# Patient Record
Sex: Female | Born: 1945
Health system: Southern US, Community
[De-identification: ages and names within clinical notes are randomized; demographics above are authoritative.]

## PROBLEM LIST (undated history)

## (undated) DIAGNOSIS — M329 Systemic lupus erythematosus, unspecified: Secondary | ICD-10-CM

## (undated) DIAGNOSIS — M1990 Unspecified osteoarthritis, unspecified site: Secondary | ICD-10-CM

## (undated) DIAGNOSIS — T07XXXA Unspecified multiple injuries, initial encounter: Secondary | ICD-10-CM

## (undated) DIAGNOSIS — J449 Chronic obstructive pulmonary disease, unspecified: Secondary | ICD-10-CM

## (undated) DIAGNOSIS — Z992 Dependence on renal dialysis: Secondary | ICD-10-CM

## (undated) DIAGNOSIS — I10 Essential (primary) hypertension: Secondary | ICD-10-CM

## (undated) DIAGNOSIS — N390 Urinary tract infection, site not specified: Secondary | ICD-10-CM

## (undated) DIAGNOSIS — I4892 Unspecified atrial flutter: Secondary | ICD-10-CM

## (undated) DIAGNOSIS — E119 Type 2 diabetes mellitus without complications: Secondary | ICD-10-CM

## (undated) DIAGNOSIS — I82629 Acute embolism and thrombosis of deep veins of unspecified upper extremity: Secondary | ICD-10-CM

## (undated) DIAGNOSIS — IMO0002 Reserved for concepts with insufficient information to code with codable children: Secondary | ICD-10-CM

## (undated) DIAGNOSIS — K635 Polyp of colon: Secondary | ICD-10-CM

## (undated) DIAGNOSIS — D649 Anemia, unspecified: Secondary | ICD-10-CM

## (undated) DIAGNOSIS — M7750 Other enthesopathy of unspecified foot: Secondary | ICD-10-CM

## (undated) DIAGNOSIS — I5032 Chronic diastolic (congestive) heart failure: Secondary | ICD-10-CM

## (undated) DIAGNOSIS — S81801A Unspecified open wound, right lower leg, initial encounter: Secondary | ICD-10-CM

## (undated) DIAGNOSIS — I4891 Unspecified atrial fibrillation: Secondary | ICD-10-CM

## (undated) DIAGNOSIS — H409 Unspecified glaucoma: Secondary | ICD-10-CM

## (undated) DIAGNOSIS — J45909 Unspecified asthma, uncomplicated: Secondary | ICD-10-CM

## (undated) DIAGNOSIS — I319 Disease of pericardium, unspecified: Secondary | ICD-10-CM

## (undated) DIAGNOSIS — N186 End stage renal disease: Secondary | ICD-10-CM

## (undated) HISTORY — DX: Acute embolism and thrombosis of deep veins of unspecified upper extremity: I82.629

## (undated) HISTORY — DX: Dependence on renal dialysis: Z99.2

## (undated) HISTORY — DX: Unspecified atrial fibrillation: I48.91

## (undated) HISTORY — DX: Unspecified osteoarthritis, unspecified site: M19.90

## (undated) HISTORY — DX: Urinary tract infection, site not specified: N39.0

## (undated) HISTORY — PX: EYE SURGERY: SHX253

## (undated) HISTORY — DX: Other enthesopathy of unspecified foot and ankle: M77.50

## (undated) HISTORY — DX: Disease of pericardium, unspecified: I31.9

## (undated) HISTORY — DX: Chronic obstructive pulmonary disease, unspecified: J44.9

## (undated) HISTORY — DX: End stage renal disease: N18.6

## (undated) HISTORY — DX: Unspecified atrial flutter: I48.92

## (undated) HISTORY — DX: Systemic lupus erythematosus, unspecified: M32.9

## (undated) HISTORY — DX: Reserved for concepts with insufficient information to code with codable children: IMO0002

## (undated) HISTORY — PX: CATARACT EXTRACTION: SUR2

## (undated) HISTORY — DX: Unspecified multiple injuries, initial encounter: T07.XXXA

## (undated) HISTORY — DX: Essential (primary) hypertension: I10

## (undated) HISTORY — DX: Chronic diastolic (congestive) heart failure: I50.32

## (undated) HISTORY — DX: Type 2 diabetes mellitus without complications: E11.9

---

## 1978-09-29 HISTORY — PX: BACK SURGERY: SHX140

## 1981-09-29 HISTORY — PX: ABDOMINAL HYSTERECTOMY: SHX81

## 1991-09-30 HISTORY — PX: ANKLE SURGERY: SHX546

## 2001-08-02 ENCOUNTER — Ambulatory Visit (HOSPITAL_COMMUNITY): Admission: RE | Admit: 2001-08-02 | Discharge: 2001-08-02 | Payer: Self-pay | Admitting: Internal Medicine

## 2001-08-02 ENCOUNTER — Encounter: Payer: Self-pay | Admitting: Internal Medicine

## 2001-08-31 ENCOUNTER — Ambulatory Visit (HOSPITAL_COMMUNITY): Admission: RE | Admit: 2001-08-31 | Discharge: 2001-08-31 | Payer: Self-pay | Admitting: Internal Medicine

## 2001-08-31 ENCOUNTER — Encounter: Payer: Self-pay | Admitting: Internal Medicine

## 2003-09-30 HISTORY — PX: COLONOSCOPY: SHX174

## 2003-11-03 ENCOUNTER — Ambulatory Visit (HOSPITAL_COMMUNITY): Admission: RE | Admit: 2003-11-03 | Discharge: 2003-11-03 | Payer: Self-pay | Admitting: Family Medicine

## 2003-11-09 ENCOUNTER — Ambulatory Visit (HOSPITAL_COMMUNITY): Admission: RE | Admit: 2003-11-09 | Discharge: 2003-11-09 | Payer: Self-pay | Admitting: Family Medicine

## 2004-01-11 ENCOUNTER — Ambulatory Visit (HOSPITAL_COMMUNITY): Admission: RE | Admit: 2004-01-11 | Discharge: 2004-01-11 | Payer: Self-pay | Admitting: *Deleted

## 2004-01-11 ENCOUNTER — Ambulatory Visit (HOSPITAL_COMMUNITY): Admission: RE | Admit: 2004-01-11 | Discharge: 2004-01-11 | Payer: Self-pay | Admitting: Family Medicine

## 2004-08-30 ENCOUNTER — Ambulatory Visit: Payer: Self-pay | Admitting: Internal Medicine

## 2004-09-05 ENCOUNTER — Ambulatory Visit: Payer: Self-pay | Admitting: Internal Medicine

## 2004-09-20 ENCOUNTER — Ambulatory Visit: Payer: Self-pay | Admitting: Internal Medicine

## 2004-09-20 ENCOUNTER — Ambulatory Visit (HOSPITAL_COMMUNITY): Admission: RE | Admit: 2004-09-20 | Discharge: 2004-09-20 | Payer: Self-pay | Admitting: Internal Medicine

## 2006-02-16 ENCOUNTER — Ambulatory Visit (HOSPITAL_COMMUNITY): Admission: RE | Admit: 2006-02-16 | Discharge: 2006-02-16 | Payer: Self-pay | Admitting: Family Medicine

## 2007-06-02 ENCOUNTER — Ambulatory Visit (HOSPITAL_COMMUNITY): Admission: RE | Admit: 2007-06-02 | Discharge: 2007-06-02 | Payer: Self-pay | Admitting: Family Medicine

## 2007-08-18 ENCOUNTER — Ambulatory Visit (HOSPITAL_COMMUNITY): Admission: RE | Admit: 2007-08-18 | Discharge: 2007-08-18 | Payer: Self-pay | Admitting: Family Medicine

## 2007-09-27 ENCOUNTER — Emergency Department (HOSPITAL_COMMUNITY): Admission: EM | Admit: 2007-09-27 | Discharge: 2007-09-27 | Payer: Self-pay | Admitting: Emergency Medicine

## 2009-04-08 ENCOUNTER — Emergency Department (HOSPITAL_COMMUNITY): Admission: EM | Admit: 2009-04-08 | Discharge: 2009-04-08 | Payer: Self-pay | Admitting: Emergency Medicine

## 2009-12-19 ENCOUNTER — Encounter (HOSPITAL_COMMUNITY): Admission: RE | Admit: 2009-12-19 | Discharge: 2010-01-18 | Payer: Self-pay | Admitting: Cardiology

## 2009-12-19 ENCOUNTER — Encounter (INDEPENDENT_AMBULATORY_CARE_PROVIDER_SITE_OTHER): Payer: Self-pay | Admitting: Cardiology

## 2009-12-19 HISTORY — PX: CARDIOVASCULAR STRESS TEST: SHX262

## 2010-04-23 ENCOUNTER — Ambulatory Visit (HOSPITAL_COMMUNITY): Admission: RE | Admit: 2010-04-23 | Discharge: 2010-04-23 | Payer: Self-pay | Admitting: Family Medicine

## 2010-10-19 ENCOUNTER — Encounter: Payer: Self-pay | Admitting: Family Medicine

## 2011-02-14 NOTE — Consult Note (Signed)
NAMEABBIE, DYKEMAN           ACCOUNT NO.:  [id number]   MEDICAL RECORD NO.:  [id number]            PATIENT TYPE:   LOCATION:                                 FACILITY:   PHYSICIAN:  R. Garfield Cornea, M.D. DATE OF BIRTH:  28-Oct-1945   DATE OF CONSULTATION:  08/30/2004  DATE OF DISCHARGE:                                   CONSULTATION   PHYSICIAN REQUESTING CONSULTATION:  Dr. Bonne Dolores.   REASON FOR CONSULTATION:  Anemia.   HISTORY OF PRESENT ILLNESS:  The patient is a 65 year old lady who presents  today for further evaluation of anemia.  She was found to be anemic in April  of 2005.  Hemoglobin 8.9, hematocrit 27.7, MCV 83.4.  She had blood work  last week that we do not have that of yet.  She was diagnosed with lupus at  that time.  She has not had any hemoccults done.  Her bowels move regularly.  She denies any melena or rectal bleeding, abdominal pain, nausea, vomiting  or heartburn.  She has never had a colonoscopy.   CURRENT MEDICATIONS:  1.  Novolin 70/30, 50 units in the morning.  2.  Cardizem CD 120 mg daily.  3.  Plaquenil 200 mg b.i.d.  4.  Ayoyeast 400 mg p.r.n.  5.  Vaseretic 10/25 mg daily.  6.  Prednisone 5 mg daily.   ALLERGIES:  No known drug allergies.   PAST MEDICAL HISTORY:  Insulin-dependent diabetes mellitus for 25 years  duration.  Diagnosis of lupus in April of 2005, hypertension.   PAST SURGICAL HISTORY:  1.  Hysterectomy.  2.  Lumbar disk surgery.  3.  Right ankle surgery due to fracture.   FAMILY HISTORY:  Mother had stroke.  Father died of unknown type of cancer.  No family history of colorectal cancer to her knowledge.   SOCIAL HISTORY:  She is married and has no children.  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.  She smokes 5-6 cigarettes daily, and  smoked for most of my life.  She denies any alcohol use.   REVIEW OF SYSTEMS:  See HPI for GI.  CARDIOPULMONARY:  Denies any chest pain  or shortness of breath.  GU:   Denies any hematuria or dysuria.   PHYSICAL EXAMINATION:  VITAL SIGNS:  Weight 255, height 5 feet 5 inches.  Temperature 98.  Blood pressure 176/86.  Pulse 92.  GENERAL:  A pleasant, morbidly obese black female, in no acute distress.  SKIN:  Warm and dry, no jaundice.  HEENT:  Conjunctivae are pink.  Sclerae are nonicteric.  Oropharyngeal  mucosa is moist and pink.  No lesions, erythema or exudate.  No  lymphadenopathy or thyromegaly.  CHEST:  Lungs are clear to auscultation.  CARDIAC:  Exam reveals regular rate and rhythm, normal S1 and S2.  No  murmurs, rubs or gallops.  ABDOMEN:  Positive bowel sounds, obese but symmetrical.  Soft, nontender, no  organomegaly or masses are appreciated but limited due to body habitus.  EXTREMITIES:  No edema.   IMPRESSION:  Shalette is a 65 year old  lady who presents today with a  normocytic anemia diagnosed around April of 2005.  Recent blood work is  unavailable to me at this time.  At any rate, she has never had a  colonoscopy and needs to have one at this time at least for screening  purposes.  If we document that she has blood in her stool or any evidence of  iron deficiency, this would be more of a diagnostic maneuver.  If  colonoscopy is unremarkable and she does have heme positive stools, then she  will need to have further evaluation of her upper GI tract.  I discussed the  risks, alternatives and benefits of colonoscopy with the patient today, and  she is agreeable to proceed.   PLAN:  1.  Colonoscopy in the near future.  2.  Hemoccult stool x3.  3.  We will request a copy of recent blood work done through Dr. Elta Guadeloupe      Cresenzo's office as well as for any anemia profile studies.     Lesl   LL/MEDQ  D:  09-14-2004  T:  14-Sep-2004  Job:  [id number]   cc:   Bonne Dolores, M.D.  [address], [place] [address]  Fax: [phone]

## 2011-02-14 NOTE — Op Note (Signed)
[name], [name]           ACCOUNT NO.:  [id number]   MEDICAL RECORD NO.:  [id number]          PATIENT TYPE:  AMB   LOCATION:  DAY                           FACILITY:  [facility]   PHYSICIAN:  R. Garfield Cornea, M.D. DATE OF BIRTH:  1946/01/28   DATE OF PROCEDURE:  09/20/2004  DATE OF DISCHARGE:                                 OPERATIVE REPORT   PROCEDURE:  Colonoscopy with snare polypectomy.   INDICATIONS FOR PROCEDURE:  The patient is a 64 year old lady, devoid of any  lower GI tract symptoms, here for screening colonoscopy. She is anemic.  Hemoccult status is unknown to me. No prior colon imaging. No family history  of colorectal neoplasia. This approach has been discussed with the patient  previously. Please see my November 01, 2003 consultation note and my  handwritten update.   PROCEDURE NOTE:  O2 saturation, blood pressure, pulse, and respirations  monitored throughout the entirety of the procedure. Conscious sedation with  Versed 5 mg IV and Demerol 100 mg IV in divided doses.   INSTRUMENT:  Olympus video chip system.   FINDINGS:  Digital rectal examination revealed no abnormalities.   ENDOSCOPIC FINDINGS:  Prep was good.   Rectum:  Examination of the rectal mucosa including retroflexed view of anal  verge revealed two fleshy pedunculated polyps in at 2 cm from the anal verge  seen, only retroflexed. Otherwise rectal mucosa appeared normal. These  polyps were approximately, one was 3 mm and one was 5 mm in dimension.   Colon:  Colonic mucosa was surveyed from the rectosigmoid junction through  the left, transverse, and right colon to area of the appendiceal orifice,  ileocecal valve, and cecum. These structures were well seen and photographed  for the record. Olympus video scope was slowly withdrawn, and all previously  mentioned mucosal surfaces were again seen. There was a 5-mm pedunculated  polyp in the mid transverse colon which was cold snared and recovered. At  the splenic flexure, there was a 1.5-cm angry pedunculated polyp on a stalk  which was hot snared and recovered with a Roth net. Remainder of colonic  mucosa appeared normal. The patient tolerated the procedure well and was  reactive to endoscopy.   IMPRESSION:  1.  Rectal polyps removed with hot snare cautery and recovered. Otherwise      normal rectum.  2.  Pedunculated polyp at the splenic flexure removed with hot snare.      Transverse colon polyp removed with cold snare. Otherwise normal colon.   RECOMMENDATIONS:  1.  No aspirin or arthritis medications for 10 days.  2.  Follow up on pathology.  3.  Further recommendations to follow.     [name]   RMR/MEDQ  D:  09/20/2004  T:  09/21/2004  Job:  [id number]   cc:   Bonne Dolores, M.D.  [address], [place] [address]  Fax: [phone]

## 2011-07-04 LAB — BASIC METABOLIC PANEL
CO2: 25
Calcium: 8.5
GFR calc non Af Amer: >60
Sodium: 133 — ABNORMAL LOW

## 2011-07-04 LAB — DIFFERENTIAL
Basophils Absolute: 0
Basophils Relative: 0
Monocytes Absolute: 0.4
Neutro Abs: 6.9
Neutrophils Relative %: 85 — ABNORMAL HIGH

## 2011-07-04 LAB — CBC
HCT: 28.2 — ABNORMAL LOW
Hemoglobin: 9.7 — ABNORMAL LOW
MCHC: 34.4
RDW: 14.9

## 2011-07-04 LAB — POCT CARDIAC MARKERS
Operator id: 270651
Troponin i, poc: <0.05
Troponin i, poc: <0.05

## 2011-08-15 ENCOUNTER — Other Ambulatory Visit (HOSPITAL_COMMUNITY): Payer: Self-pay | Admitting: Physician Assistant

## 2011-08-15 ENCOUNTER — Ambulatory Visit (HOSPITAL_COMMUNITY)
Admission: RE | Admit: 2011-08-15 | Discharge: 2011-08-15 | Disposition: A | Discharge status: Home / Self Care | Payer: Medicare Other | Source: Ambulatory Visit | Attending: Physician Assistant | Admitting: Physician Assistant

## 2011-08-15 DIAGNOSIS — J449 Chronic obstructive pulmonary disease, unspecified: Secondary | ICD-10-CM

## 2011-08-15 DIAGNOSIS — R05 Cough: Secondary | ICD-10-CM

## 2011-08-15 DIAGNOSIS — R079 Chest pain, unspecified: Secondary | ICD-10-CM | POA: Insufficient documentation

## 2011-08-15 DIAGNOSIS — R059 Cough, unspecified: Secondary | ICD-10-CM | POA: Insufficient documentation

## 2011-08-15 DIAGNOSIS — Z139 Encounter for screening, unspecified: Secondary | ICD-10-CM

## 2011-09-08 ENCOUNTER — Ambulatory Visit (HOSPITAL_COMMUNITY): Payer: Medicare Other

## 2011-09-19 ENCOUNTER — Ambulatory Visit (HOSPITAL_COMMUNITY)
Admission: RE | Admit: 2011-09-19 | Discharge: 2011-09-19 | Disposition: A | Discharge status: Home / Self Care | Payer: Medicare Other | Source: Ambulatory Visit | Attending: Physician Assistant | Admitting: Physician Assistant

## 2011-09-19 DIAGNOSIS — Z139 Encounter for screening, unspecified: Secondary | ICD-10-CM

## 2011-09-19 DIAGNOSIS — Z1231 Encounter for screening mammogram for malignant neoplasm of breast: Secondary | ICD-10-CM | POA: Insufficient documentation

## 2012-09-04 ENCOUNTER — Encounter (HOSPITAL_COMMUNITY): Payer: Self-pay

## 2012-09-04 ENCOUNTER — Emergency Department (HOSPITAL_COMMUNITY): Payer: Medicare Other

## 2012-09-04 ENCOUNTER — Emergency Department (HOSPITAL_COMMUNITY)
Admission: EM | Admit: 2012-09-04 | Discharge: 2012-09-04 | Disposition: A | Discharge status: Home / Self Care | Payer: Medicare Other | Attending: Emergency Medicine | Admitting: Emergency Medicine

## 2012-09-04 DIAGNOSIS — E119 Type 2 diabetes mellitus without complications: Secondary | ICD-10-CM | POA: Insufficient documentation

## 2012-09-04 DIAGNOSIS — R0789 Other chest pain: Secondary | ICD-10-CM

## 2012-09-04 DIAGNOSIS — Z7901 Long term (current) use of anticoagulants: Secondary | ICD-10-CM | POA: Insufficient documentation

## 2012-09-04 DIAGNOSIS — Z87891 Personal history of nicotine dependence: Secondary | ICD-10-CM | POA: Insufficient documentation

## 2012-09-04 DIAGNOSIS — Z79899 Other long term (current) drug therapy: Secondary | ICD-10-CM | POA: Insufficient documentation

## 2012-09-04 DIAGNOSIS — R071 Chest pain on breathing: Secondary | ICD-10-CM | POA: Insufficient documentation

## 2012-09-04 DIAGNOSIS — I10 Essential (primary) hypertension: Secondary | ICD-10-CM | POA: Insufficient documentation

## 2012-09-04 DIAGNOSIS — J45909 Unspecified asthma, uncomplicated: Secondary | ICD-10-CM | POA: Insufficient documentation

## 2012-09-04 DIAGNOSIS — Z794 Long term (current) use of insulin: Secondary | ICD-10-CM | POA: Insufficient documentation

## 2012-09-04 HISTORY — DX: Unspecified asthma, uncomplicated: J45.909

## 2012-09-04 LAB — BASIC METABOLIC PANEL
Calcium: 9 mg/dL (ref 8.4–10.5)
Chloride: 100 mEq/L (ref 96–112)
Creatinine, Ser: 1.08 mg/dL (ref 0.50–1.10)
GFR calc Af Amer: 61 mL/min — ABNORMAL LOW (ref >90)
GFR calc non Af Amer: 52 mL/min — ABNORMAL LOW (ref >90)

## 2012-09-04 LAB — CBC WITH DIFFERENTIAL/PLATELET
Eosinophils Absolute: 0 10*3/uL (ref 0.0–0.7)
Eosinophils Relative: 0 % (ref 0–5)
HCT: 28.2 % — ABNORMAL LOW (ref 36.0–46.0)
Lymphs Abs: 0.9 10*3/uL (ref 0.7–4.0)
MCH: 28.2 pg (ref 26.0–34.0)
MCHC: 33 g/dL (ref 30.0–36.0)
MCV: 85.5 fL (ref 78.0–100.0)
Platelets: 218 10*3/uL (ref 150–400)

## 2012-09-04 MED ORDER — ASPIRIN 81 MG PO CHEW
324.0000 mg | CHEWABLE_TABLET | Freq: Once | ORAL | Status: AC
  Administered 2012-09-04: 324 mg via ORAL
  Filled 2012-09-04: qty 4

## 2012-09-04 NOTE — ED Notes (Signed)
Pt states her chest hurts when taking a deep breath

## 2012-09-04 NOTE — ED Provider Notes (Signed)
History     CSN: [id number]  Arrival date & time 09/04/12  [id number]   First MD Initiated Contact with Patient 09/04/12 [phone]      Chief Complaint  Patient presents with  . Pleurisy    (Consider location/radiation/quality/duration/timing/severity/associated sxs/prior treatment) HPI Comments: Pt states she has had "tightness" in her chest ~ 1 week.  "it is always there but sometimes it is worse than others"  Worse generally in past 2 days.  She went to see dr. Hilma Favors who sent her to the ED.  Pain is worse with movement, deep inspiration and palpation.  No fever or chills.  No cough or trauma.  Denies radiation, diaphoresis, n/v or pre-syncopal sxs.  The history is provided by the patient. No language interpreter was used.    Past Medical History  Diagnosis Date  . Asthma   . Hypertension   . Diabetes mellitus without complication     History reviewed. No pertinent past surgical history.  History reviewed. No pertinent family history.  History  Substance Use Topics  . Smoking status: Former Research scientist (life sciences)  . Smokeless tobacco: Not on file  . Alcohol Use: No    OB History    Grav Para Term Preterm Abortions TAB SAB Ect Mult Living                  Review of Systems  Constitutional: Negative for fever and chills.  Respiratory: Negative for cough, shortness of breath and wheezing.   Cardiovascular: Positive for chest pain.  All other systems reviewed and are negative.    Allergies  Review of patient's allergies indicates no known allergies.  Home Medications   Current Outpatient Rx  Name  Route  Sig  Dispense  Refill  . ACETAMINOPHEN 500 MG PO TABS   Oral   Take 1,000 mg by mouth every 6 (six) hours as needed. For pain         . ALBUTEROL SULFATE HFA 108 (90 BASE) MCG/ACT IN AERS   Inhalation   Inhale 2 puffs into the lungs every 6 (six) hours as needed. For shortness of breath         . ASPIRIN EC 81 MG PO TBEC   Oral   Take 81 mg by mouth daily.         Marland Kitchen  HYDROXYCHLOROQUINE SULFATE 200 MG PO TABS   Oral   Take 200 mg by mouth 2 (two) times daily.         . INSULIN GLARGINE 100 UNIT/ML [place] SOLN   Subcutaneous   Inject 70 Units into the skin at bedtime.         . CENTRUM SILVER PO   Oral   Take 1 tablet by mouth daily.         Marland Kitchen PREDNISONE 5 MG PO TABS   Oral   Take 5 mg by mouth daily.         Marland Kitchen PRESCRIPTION MEDICATION   Oral   Take 1 tablet by mouth daily. Blood pressure medication- unknown name/strength           BP 162/56  Pulse 103  Temp 98.7 F (37.1 C) (Oral)  Resp 24  Ht 5\' 5"  (1.651 m)  Wt 250 lb (113.399 kg)  BMI 41.60 kg/m2  SpO2 98%  Physical Exam  Nursing note and vitals reviewed. Constitutional: She is oriented to person, place, and time. She appears well-developed and well-nourished.  Non-toxic appearance. She does not have a sickly appearance. She  does not appear ill. No distress.  HENT:  Head: Normocephalic and atraumatic.  Right Ear: External ear normal.  Left Ear: External ear normal.  Eyes: EOM are normal.  Neck: Normal range of motion.  Cardiovascular: Normal rate, regular rhythm and normal heart sounds.   Pulmonary/Chest: Effort normal and breath sounds normal. No accessory muscle usage. Not tachypneic. No respiratory distress. She has no decreased breath sounds. She has no wheezes. She has no rhonchi. She has no rales. She exhibits tenderness. She exhibits no crepitus, no swelling and no retraction.    Abdominal: Soft. She exhibits no distension. There is no tenderness.  Musculoskeletal: Normal range of motion.  Neurological: She is alert and oriented to person, place, and time. Coordination normal.  Skin: Skin is warm and dry.  Psychiatric: She has a normal mood and affect. Judgment normal.    ED Course  Procedures (including critical care time)  Labs Reviewed  CBC WITH DIFFERENTIAL - Abnormal; Notable for the following:    RBC 3.30 (*)     Hemoglobin 9.3 (*)     HCT 28.2 (*)       Neutrophils Relative 83 (*)     Neutro Abs 8.3 (*)     Lymphocytes Relative 9 (*)     All other components within normal limits  BASIC METABOLIC PANEL - Abnormal; Notable for the following:    Sodium 134 (*)     Glucose, Bld 46 (*)     GFR calc non Af Amer 52 (*)     GFR calc Af Amer 61 (*)     All other components within normal limits  TROPONIN I   Dg Chest 2 View  09/04/2012  *RADIOLOGY REPORT*  Clinical Data: Pleurisy  CHEST - 2 VIEW  Comparison: 08/15/2011  Findings: Cardiomegaly again noted.  No acute infiltrate or pleural effusion.  No pulmonary edema.  Stable mild degenerative changes thoracic spine.  IMPRESSION: No acute infiltrate or pulmonary edema.  Stable mild degenerative changes thoracic spine.   Original Report Authenticated By: Lahoma Crocker, M.D.    Date: 09/04/2012  Rate: 100  Rhythm: normal sinus rhythm  QRS Axis: normal  Intervals: normal  ST/T Wave abnormalities: nonspecific ST/T changes  Conduction Disutrbances:none  Narrative Interpretation:   Old EKG Reviewed: unchanged from 09-27-2007    1. Chest wall pain       MDM  Ibuprofen up to 800 mg every 8 hrs with food.  F/u with dr. Hilma Favors.        Jennye Boroughs, [place] 09/04/12 1156

## 2012-09-04 NOTE — ED Provider Notes (Signed)
Medical screening examination/treatment/procedure(s) were performed by non-physician practitioner and as supervising physician I was immediately available for consultation/collaboration.   Charles B. Karle Starch, MD 09/04/12 1201

## 2012-09-14 ENCOUNTER — Encounter (HOSPITAL_COMMUNITY): Payer: Self-pay

## 2012-09-14 ENCOUNTER — Inpatient Hospital Stay: Admission: AD | Admit: 2012-09-14 | Payer: Self-pay | Source: Other Acute Inpatient Hospital | Admitting: Cardiology

## 2012-09-14 ENCOUNTER — Inpatient Hospital Stay (HOSPITAL_COMMUNITY)
Admission: EM | Admit: 2012-09-14 | Discharge: 2012-09-20 | DRG: 308 | Disposition: A | Discharge status: Home / Self Care | Payer: Medicare Other | Attending: Cardiovascular Disease | Admitting: Cardiovascular Disease

## 2012-09-14 ENCOUNTER — Emergency Department (HOSPITAL_COMMUNITY): Payer: Medicare Other

## 2012-09-14 DIAGNOSIS — J449 Chronic obstructive pulmonary disease, unspecified: Secondary | ICD-10-CM | POA: Diagnosis present

## 2012-09-14 DIAGNOSIS — E1140 Type 2 diabetes mellitus with diabetic neuropathy, unspecified: Secondary | ICD-10-CM | POA: Diagnosis present

## 2012-09-14 DIAGNOSIS — Z7982 Long term (current) use of aspirin: Secondary | ICD-10-CM

## 2012-09-14 DIAGNOSIS — I129 Hypertensive chronic kidney disease with stage 1 through stage 4 chronic kidney disease, or unspecified chronic kidney disease: Secondary | ICD-10-CM | POA: Diagnosis present

## 2012-09-14 DIAGNOSIS — D126 Benign neoplasm of colon, unspecified: Secondary | ICD-10-CM | POA: Diagnosis present

## 2012-09-14 DIAGNOSIS — E669 Obesity, unspecified: Secondary | ICD-10-CM

## 2012-09-14 DIAGNOSIS — Z79899 Other long term (current) drug therapy: Secondary | ICD-10-CM

## 2012-09-14 DIAGNOSIS — E875 Hyperkalemia: Secondary | ICD-10-CM | POA: Diagnosis present

## 2012-09-14 DIAGNOSIS — D649 Anemia, unspecified: Secondary | ICD-10-CM | POA: Diagnosis present

## 2012-09-14 DIAGNOSIS — I2789 Other specified pulmonary heart diseases: Secondary | ICD-10-CM | POA: Diagnosis present

## 2012-09-14 DIAGNOSIS — I4891 Unspecified atrial fibrillation: Secondary | ICD-10-CM | POA: Diagnosis present

## 2012-09-14 DIAGNOSIS — E871 Hypo-osmolality and hyponatremia: Secondary | ICD-10-CM | POA: Diagnosis present

## 2012-09-14 DIAGNOSIS — N183 Chronic kidney disease, stage 3 unspecified: Secondary | ICD-10-CM | POA: Diagnosis present

## 2012-09-14 DIAGNOSIS — I359 Nonrheumatic aortic valve disorder, unspecified: Secondary | ICD-10-CM | POA: Diagnosis present

## 2012-09-14 DIAGNOSIS — R079 Chest pain, unspecified: Secondary | ICD-10-CM

## 2012-09-14 DIAGNOSIS — I5031 Acute diastolic (congestive) heart failure: Secondary | ICD-10-CM | POA: Diagnosis present

## 2012-09-14 DIAGNOSIS — N289 Disorder of kidney and ureter, unspecified: Secondary | ICD-10-CM | POA: Diagnosis present

## 2012-09-14 DIAGNOSIS — R7989 Other specified abnormal findings of blood chemistry: Secondary | ICD-10-CM | POA: Diagnosis present

## 2012-09-14 DIAGNOSIS — I10 Essential (primary) hypertension: Secondary | ICD-10-CM | POA: Diagnosis present

## 2012-09-14 DIAGNOSIS — I4892 Unspecified atrial flutter: Principal | ICD-10-CM | POA: Diagnosis present

## 2012-09-14 DIAGNOSIS — R590 Localized enlarged lymph nodes: Secondary | ICD-10-CM | POA: Diagnosis present

## 2012-09-14 DIAGNOSIS — E11649 Type 2 diabetes mellitus with hypoglycemia without coma: Secondary | ICD-10-CM | POA: Insufficient documentation

## 2012-09-14 DIAGNOSIS — I351 Nonrheumatic aortic (valve) insufficiency: Secondary | ICD-10-CM | POA: Diagnosis present

## 2012-09-14 DIAGNOSIS — M329 Systemic lupus erythematosus, unspecified: Secondary | ICD-10-CM | POA: Diagnosis present

## 2012-09-14 DIAGNOSIS — I5033 Acute on chronic diastolic (congestive) heart failure: Secondary | ICD-10-CM | POA: Diagnosis present

## 2012-09-14 DIAGNOSIS — D509 Iron deficiency anemia, unspecified: Secondary | ICD-10-CM | POA: Diagnosis present

## 2012-09-14 DIAGNOSIS — E119 Type 2 diabetes mellitus without complications: Secondary | ICD-10-CM

## 2012-09-14 DIAGNOSIS — F17200 Nicotine dependence, unspecified, uncomplicated: Secondary | ICD-10-CM | POA: Diagnosis present

## 2012-09-14 DIAGNOSIS — Z7901 Long term (current) use of anticoagulants: Secondary | ICD-10-CM

## 2012-09-14 DIAGNOSIS — I509 Heart failure, unspecified: Secondary | ICD-10-CM | POA: Diagnosis present

## 2012-09-14 DIAGNOSIS — E66812 Obesity, class 2: Secondary | ICD-10-CM | POA: Diagnosis present

## 2012-09-14 DIAGNOSIS — Z6841 Body Mass Index (BMI) 40.0 and over, adult: Secondary | ICD-10-CM

## 2012-09-14 DIAGNOSIS — I272 Pulmonary hypertension, unspecified: Secondary | ICD-10-CM | POA: Diagnosis present

## 2012-09-14 DIAGNOSIS — J4489 Other specified chronic obstructive pulmonary disease: Secondary | ICD-10-CM | POA: Diagnosis present

## 2012-09-14 DIAGNOSIS — E1169 Type 2 diabetes mellitus with other specified complication: Secondary | ICD-10-CM | POA: Diagnosis present

## 2012-09-14 HISTORY — DX: Anemia, unspecified: D64.9

## 2012-09-14 HISTORY — DX: Systemic lupus erythematosus, unspecified: M32.9

## 2012-09-14 LAB — BASIC METABOLIC PANEL
BUN: 23 mg/dL (ref 6–23)
CO2: 27 mEq/L (ref 19–32)
Chloride: 93 mEq/L — ABNORMAL LOW (ref 96–112)
Creatinine, Ser: 1.26 mg/dL — ABNORMAL HIGH (ref 0.50–1.10)
GFR calc Af Amer: 50 mL/min — ABNORMAL LOW (ref >90)
Potassium: 4.7 mEq/L (ref 3.5–5.1)

## 2012-09-14 LAB — HEPATIC FUNCTION PANEL
ALT: 18 U/L (ref 0–35)
Alkaline Phosphatase: 79 U/L (ref 39–117)
Bilirubin, Direct: 0.1 mg/dL (ref 0.0–0.3)
Indirect Bilirubin: 0.1 mg/dL — ABNORMAL LOW (ref 0.3–0.9)
Total Bilirubin: 0.2 mg/dL — ABNORMAL LOW (ref 0.3–1.2)

## 2012-09-14 LAB — CBC
HCT: 24.2 % — ABNORMAL LOW (ref 36.0–46.0)
Hemoglobin: 8 g/dL — ABNORMAL LOW (ref 12.0–15.0)
MCV: 84.9 fL (ref 78.0–100.0)
RBC: 2.85 MIL/uL — ABNORMAL LOW (ref 3.87–5.11)
RDW: 14.9 % (ref 11.5–15.5)
WBC: 7.1 10*3/uL (ref 4.0–10.5)

## 2012-09-14 LAB — BLOOD GAS, ARTERIAL
Drawn by: 21694
pCO2 arterial: 36.2 mmHg (ref 35.0–45.0)
pH, Arterial: 7.431 (ref 7.350–7.450)

## 2012-09-14 LAB — GLUCOSE, CAPILLARY
Glucose-Capillary: 70 mg/dL (ref 70–99)
Glucose-Capillary: <10 mg/dL — CL (ref 70–99)
Glucose-Capillary: 124 mg/dL — ABNORMAL HIGH (ref 70–99)

## 2012-09-14 LAB — D-DIMER, QUANTITATIVE: D-Dimer, Quant: 10.72 ug/mL-FEU — ABNORMAL HIGH (ref 0.00–0.48)

## 2012-09-14 MED ORDER — IPRATROPIUM BROMIDE 0.02 % IN SOLN: 0.5000 mg | Freq: Four times a day (QID) | RESPIRATORY_TRACT | Status: DC | PRN

## 2012-09-14 MED ORDER — FLEET ENEMA 7-19 GM/118ML RE ENEM: 1.0000 | ENEMA | Freq: Once | RECTAL | Status: AC | PRN

## 2012-09-14 MED ORDER — DILTIAZEM HCL 100 MG IV SOLR
5.0000 mg/h | INTRAVENOUS | Status: DC
  Administered 2012-09-15: 5 mg/h via INTRAVENOUS
  Administered 2012-09-16: 15 mg/h via INTRAVENOUS
  Filled 2012-09-14: qty 100

## 2012-09-14 MED ORDER — LEVALBUTEROL HCL 0.63 MG/3ML IN NEBU: 0.6300 mg | INHALATION_SOLUTION | Freq: Four times a day (QID) | RESPIRATORY_TRACT | Status: DC | PRN

## 2012-09-14 MED ORDER — POTASSIUM CHLORIDE IN NACL 20-0.9 MEQ/L-% IV SOLN
INTRAVENOUS | Status: DC
  Administered 2012-09-15: 1000 mL via INTRAVENOUS
  Administered 2012-09-15: 20 mL/h via INTRAVENOUS
  Filled 2012-09-14 (×2): qty 1000

## 2012-09-14 MED ORDER — SORBITOL 70 % SOLN
30.0000 mL | Freq: Every day | Status: DC | PRN
  Administered 2012-09-15 – 2012-09-19 (×2): 30 mL via ORAL
  Filled 2012-09-14 (×2): qty 30

## 2012-09-14 MED ORDER — DILTIAZEM HCL 50 MG/10ML IV SOLN
10.0000 mg | Freq: Once | INTRAVENOUS | Status: AC
  Administered 2012-09-14: 10 mg via INTRAVENOUS
  Filled 2012-09-14: qty 2

## 2012-09-14 MED ORDER — HEPARIN (PORCINE) IN NACL 100-0.45 UNIT/ML-% IJ SOLN
14.0000 [IU]/kg/h | INTRAMUSCULAR | Status: AC
  Administered 2012-09-14: 14 [IU]/kg/h via INTRAVENOUS
  Filled 2012-09-14 (×4): qty 250

## 2012-09-14 MED ORDER — METOPROLOL TARTRATE 1 MG/ML IV SOLN
5.0000 mg | Freq: Once | INTRAVENOUS | Status: AC
  Administered 2012-09-14: 5 mg via INTRAVENOUS
  Filled 2012-09-14: qty 5

## 2012-09-14 MED ORDER — METOPROLOL TARTRATE 25 MG/10 ML ORAL SUSPENSION: 25.0000 mg | Freq: Two times a day (BID) | ORAL | Status: DC

## 2012-09-14 MED ORDER — PREDNISONE 10 MG PO TABS
10.0000 mg | ORAL_TABLET | Freq: Every day | ORAL | Status: DC
  Administered 2012-09-15: 10 mg via ORAL
  Filled 2012-09-14: qty 1

## 2012-09-14 MED ORDER — DILTIAZEM HCL 25 MG/5ML IV SOLN
10.0000 mg | Freq: Once | INTRAVENOUS | Status: AC
  Administered 2012-09-14: 10 mg via INTRAVENOUS

## 2012-09-14 MED ORDER — ACETAMINOPHEN 500 MG PO TABS: 1000.0000 mg | ORAL_TABLET | Freq: Four times a day (QID) | ORAL | Status: DC | PRN

## 2012-09-14 MED ORDER — HEPARIN BOLUS VIA INFUSION
4000.0000 [IU] | Freq: Once | INTRAVENOUS | Status: AC
  Administered 2012-09-14: 4000 [IU] via INTRAVENOUS

## 2012-09-14 MED ORDER — DILTIAZEM HCL 100 MG IV SOLR
5.0000 mg/h | Freq: Once | INTRAVENOUS | Status: AC
  Administered 2012-09-14: 5 mg/h via INTRAVENOUS
  Filled 2012-09-14: qty 100

## 2012-09-14 MED ORDER — INSULIN ASPART 100 UNIT/ML ~~LOC~~ SOLN
0.0000 [IU] | Freq: Three times a day (TID) | SUBCUTANEOUS | Status: DC
  Administered 2012-09-15: 5 [IU] via SUBCUTANEOUS
  Administered 2012-09-16 – 2012-09-17 (×2): 2 [IU] via SUBCUTANEOUS
  Administered 2012-09-17: 3 [IU] via SUBCUTANEOUS
  Administered 2012-09-17: 5 [IU] via SUBCUTANEOUS
  Administered 2012-09-18 (×2): 3 [IU] via SUBCUTANEOUS
  Administered 2012-09-18 – 2012-09-19 (×3): 5 [IU] via SUBCUTANEOUS
  Administered 2012-09-20: 2 [IU] via SUBCUTANEOUS
  Administered 2012-09-20: 5 [IU] via SUBCUTANEOUS

## 2012-09-14 MED ORDER — INSULIN ASPART 100 UNIT/ML ~~LOC~~ SOLN
0.0000 [IU] | Freq: Every day | SUBCUTANEOUS | Status: DC
Start: 2012-09-15 — End: 2012-09-20
  Administered 2012-09-15 – 2012-09-18 (×2): 2 [IU] via SUBCUTANEOUS

## 2012-09-14 MED ORDER — ONDANSETRON HCL 4 MG/2ML IJ SOLN: 4.0000 mg | INTRAMUSCULAR | Status: DC | PRN

## 2012-09-14 MED ORDER — DEXTROSE 5 % IV SOLN
5.0000 mg/h | Freq: Once | INTRAVENOUS | Status: AC
  Administered 2012-09-14: 10 mg/h via INTRAVENOUS

## 2012-09-14 MED ORDER — HYDROXYCHLOROQUINE SULFATE 200 MG PO TABS
200.0000 mg | ORAL_TABLET | Freq: Two times a day (BID) | ORAL | Status: DC
  Administered 2012-09-15 – 2012-09-20 (×12): 200 mg via ORAL
  Filled 2012-09-14 (×17): qty 1

## 2012-09-14 MED ORDER — DEXTROSE 50 % IV SOLN
INTRAVENOUS | Status: AC
  Filled 2012-09-14: qty 50

## 2012-09-14 MED ORDER — ASPIRIN EC 81 MG PO TBEC
81.0000 mg | DELAYED_RELEASE_TABLET | Freq: Every day | ORAL | Status: DC
  Administered 2012-09-15: 81 mg via ORAL
  Filled 2012-09-14 (×2): qty 1

## 2012-09-14 MED ORDER — DEXTROSE 50 % IV SOLN
1.0000 | Freq: Once | INTRAVENOUS | Status: AC
  Administered 2012-09-14: 50 mL via INTRAVENOUS

## 2012-09-14 MED ORDER — METOPROLOL TARTRATE 1 MG/ML IV SOLN
5.0000 mg | Freq: Once | INTRAVENOUS | Status: AC
Start: 2012-09-14 — End: 2012-09-14
  Administered 2012-09-14: 5 mg via INTRAVENOUS
  Filled 2012-09-14: qty 5

## 2012-09-14 NOTE — ED Notes (Signed)
Pt was here about a week ago with chest pain and had a complete work up. Was seen at her PCP this morning and sent back here for the same thing

## 2012-09-14 NOTE — ED Notes (Signed)
Pt awake, a and o x 4. Pt eating meal tray. cbg reads LO x 2 different machines. 1 amp D50 given IV. EDP notified. Vss. Pt asymptomatic.

## 2012-09-14 NOTE — ED Notes (Signed)
Assisted pt to and from bedside commode with minimal assistance.  Pt's HR prior to exertion 110bpm, after exertion, HR increased to 146 bpm and pt became more sob.  Pt back in bed at this time.  HR 103 bpm at rest and breathing easier at this time.  nad noted.

## 2012-09-14 NOTE — ED Notes (Signed)
edp notified of pt's HR.  Orders received.

## 2012-09-14 NOTE — ED Notes (Signed)
Gave patient pack of peanut butter nabs and orange juice x 2. Patient alert and oriented at this time.

## 2012-09-14 NOTE — ED Notes (Signed)
Still waiting on Stepdown bed at [facility]. Checked  with Bed Control earlier , still no bed.

## 2012-09-14 NOTE — ED Notes (Signed)
Attempted to call report. Was told the nurse would call me right back.

## 2012-09-14 NOTE — ED Provider Notes (Addendum)
History   This chart was scribed for Nat Christen, MD by Kathreen Cornfield, ED Scribe. The patient was seen in room [facility] and the patient's care was started at 10:55AM.     CSN: [id number]  Arrival date & time 09/14/12  1036   First MD Initiated Contact with Patient 09/14/12 1055      Chief Complaint  Patient presents with  . Chest Pain    (Consider location/radiation/quality/duration/timing/severity/associated sxs/prior treatment) The history is provided by the patient. No language interpreter was used.    [name] is a 66 y.o. female , with a hx of diabetes, hypertension, and chest pain, who presents to the Emergency Department complaining of sudden, progressively worsening, chest pain, located at the left side of the chest, onset three days ago (09/11/12) . Associated symptoms include shortness of breath and hypertension. The pt reports she was referred to [facility] this morning, by Dr. Delanna Ahmadi office, due to their concern regarding her evaluation which revealed chest pain and tachycardia.  The pt does not smoke or drink alcohol.   PCP is Dr. Hilma Favors.    Past Medical History  Diagnosis Date  . Asthma   . Hypertension   . Diabetes mellitus without complication     History reviewed. No pertinent past surgical history.  History reviewed. No pertinent family history.  History  Substance Use Topics  . Smoking status: Former Research scientist (life sciences)  . Smokeless tobacco: Not on file  . Alcohol Use: No    OB History    Grav Para Term Preterm Abortions TAB SAB Ect Mult Living                  Review of Systems  Cardiovascular: Positive for chest pain.  All other systems reviewed and are negative.    Allergies  Review of patient's allergies indicates no known allergies.  Home Medications   Current Outpatient Rx  Name  Route  Sig  Dispense  Refill  . ACETAMINOPHEN 500 MG PO TABS   Oral   Take 1,000 mg by mouth every 6 (six) hours as needed. For pain         .  ALBUTEROL SULFATE HFA 108 (90 BASE) MCG/ACT IN AERS   Inhalation   Inhale 2 puffs into the lungs every 6 (six) hours as needed. For shortness of breath         . ASPIRIN EC 81 MG PO TBEC   Oral   Take 81 mg by mouth daily.         Marland Kitchen HYDROXYCHLOROQUINE SULFATE 200 MG PO TABS   Oral   Take 200 mg by mouth 2 (two) times daily.         . INSULIN GLARGINE 100 UNIT/ML [place] SOLN   Subcutaneous   Inject 70 Units into the skin at bedtime.         . CENTRUM SILVER PO   Oral   Take 1 tablet by mouth daily.         Marland Kitchen PREDNISONE 5 MG PO TABS   Oral   Take 5 mg by mouth daily.         Marland Kitchen PRESCRIPTION MEDICATION   Oral   Take 1 tablet by mouth daily. Blood pressure medication- unknown name/strength           BP 126/81  Pulse 148  Temp 97.5 F (36.4 C) (Oral)  Resp 22  Ht 5\' 5"  (1.651 m)  Wt 250 lb (113.399 kg)  BMI 41.60 kg/m2  SpO2 92%  Physical Exam  Nursing note and vitals reviewed. Constitutional: She is oriented to person, place, and time. She appears well-developed and well-nourished.       Obese  HENT:  Head: Normocephalic and atraumatic.  Eyes: Conjunctivae normal and EOM are normal. Pupils are equal, round, and reactive to light.  Neck: Normal range of motion. Neck supple.  Cardiovascular: Regular rhythm and normal heart sounds.  Tachycardia present.   Pulmonary/Chest: Effort normal and breath sounds normal.  Abdominal: Soft. Bowel sounds are normal.  Musculoskeletal: Normal range of motion.  Neurological: She is alert and oriented to person, place, and time.  Skin: Skin is warm and dry.  Psychiatric: She has a normal mood and affect.    ED Course  Procedures (including critical care time)  DIAGNOSTIC STUDIES: Oxygen Saturation is 92% on [place], adequate by my interpretation.    COORDINATION OF CARE:   11:12 AM- Treatment plan concerning IV Cardizem and possible hospital admission discussed with patient. Pt agrees with treatment.    Results for  orders placed during the hospital encounter of 09/14/12  CBC      Component Value Range   WBC 7.1  4.0 - 10.5 K/uL   RBC 2.85 (*) 3.87 - 5.11 MIL/uL   Hemoglobin 8.0 (*) 12.0 - 15.0 g/dL   HCT 24.2 (*) 36.0 - 46.0 %   MCV 84.9  78.0 - 100.0 fL   MCH 28.1  26.0 - 34.0 pg   MCHC 33.1  30.0 - 36.0 g/dL   RDW 14.9  11.5 - 15.5 %   Platelets 395  150 - 400 K/uL  BASIC METABOLIC PANEL      Component Value Range   Sodium 129 (*) 135 - 145 mEq/L   Potassium 4.7  3.5 - 5.1 mEq/L   Chloride 93 (*) 96 - 112 mEq/L   CO2 27  19 - 32 mEq/L   Glucose, Bld 57 (*) 70 - 99 mg/dL   BUN 23  6 - 23 mg/dL   Creatinine, Ser 1.26 (*) 0.50 - 1.10 mg/dL   Calcium 8.6  8.4 - 10.5 mg/dL   GFR calc non Af Amer 43 (*) >90 mL/min   GFR calc Af Amer 50 (*) >90 mL/min  TROPONIN I      Component Value Range   Troponin I <0.30  <0.30 ng/mL    Dg Chest Port 1 View  09/14/2012  *RADIOLOGY REPORT*  Clinical Data: Chest pain and tachycardia.  PORTABLE CHEST - 1 VIEW  Comparison: PA and lateral chest 09/04/2012.  Findings: Marked enlargement cardiopericardial silhouette is again seen.  There is vascular congestion without frank edema.  No focal airspace disease, pleural effusion or pneumothorax.  IMPRESSION: No change in marked enlargement of the cardiopericardial silhouette and pulmonary vascular congestion.   Original Report Authenticated By: Orlean Patten, M.D.       No diagnosis found.   Date: 09/14/2012  11:14  Rate: 149  Rhythm: supraventricular tachycardia (SVT)  QRS Axis: normal  Intervals: normal  ST/T Wave abnormalities: normal  Conduction Disutrbances:none  Narrative Interpretation:   Old EKG Reviewed: none available    Date: 09/14/2012    12:15  Rate: 111  Rhythm: atrial flutter  QRS Axis: normal  Intervals: normal  ST/T Wave abnormalities: normal  Conduction Disutrbances:none  Narrative Interpretation:   Old EKG Reviewed: changes noted   CRITICAL CARE Performed by:  Nat Christen   Total critical care time: 30  Critical care time was exclusive of separately  billable procedures and treating other patients.  Critical care was necessary to treat or prevent imminent or life-threatening deterioration.  Critical care was time spent personally by me on the following activities: development of treatment plan with patient and/or surrogate as well as nursing, discussions with consultants, evaluation of patient's response to treatment, examination of patient, obtaining history from patient or surrogate, ordering and performing treatments and interventions, ordering and review of laboratory studies, ordering and review of radiographic studies, pulse oximetry and re-evaluation of patient's condition.  MDM  Patient presents with chest pain..  Initial EKG reveals a PSVT with a rate of 150.  Patient is also obese, anemic, hypertensive, diabetic.  IV Cardizem initiated along with IV Lopressor..  Rate has slowed to the low 100s.  Admit to [facility]     I personally performed the services described in this documentation, which was scribed in my presence. The recorded information has been reviewed and is accurate.   Nat Christen, MD 09/14/12 1354  I received a call from Dr. Verl Blalock at [facility] stating that there were no beds available and to see the patient to be admitted here. Heart rate had been controlled went back up to 140. Diltiazem drip was increased with further control of rate but her heart rate then went up again and she got an additional dose of metoprolol. Case was discussed with Dr. Megan Salon who is concerned that she looked uncomfortable and requested repeat cardiac enzymes be obtained. Repeat troponin is normal and heart rate has come down with this second dose of metoprolol. Also, Dr. Verl Blalock recommended that she be anticoagulated, so she was started on a heparin drip. Case was discussed once again with Dr. Megan Salon of [facility] who agrees  to admit her here with cardiology consultation to be obtained in the morning. Also, of note, when her heart rate has slowed down, her underlying rhythm seems to be atrial flutter rather than atrial fibrillation and conduction seems to vary between 2-1 and 3-1 induction.  Delora Fuel, MD [id number]   Date: 09/14/2012  2034  Rate: 123  Rhythm: Atrial Flutter  QRS Axis: normal  Intervals: normal  ST/T Wave abnormalities: T wave inversion in the lateral and anterolateral leads  Conduction Disutrbances:none  Narrative Interpretation: Atrial flutter, T wave inversion in anterolateral and lateral leads. No change from prior ECG.  Old EKG Reviewed: unchanged    Delora Fuel, MD [id number]

## 2012-09-14 NOTE — H&P (Signed)
[facility] History and Physical  [name]  [id number]  DOB: 11-Aug-1946   DOA: 09/14/2012   PCP:   [name], MD  Cardiology: Dr. Verl Blalock  Chief Complaint:  Chest pain and shortness of breath for 10 days  The history is somewhat compromised because patient falls asleep repeatedly during the interview, and gives information which is not corroborated by review of medical records.  HPI: [name] is an 66 y.o. female.   Morbidly obese African American lady, SLE, DM, HTN, tobacco abuse, and diastolic dysfunction on echo in 2011, reports chest pain and shortness of breath for the past week to 10 days. She reports she visited her primary care physician about 10 days ago and was treated symptomatically, had a repeat visit to him on Saturday, she was given Percocet for chest pain, and then sent to the [facility] emergency room, where she was treated and sent home. No record of this visit exits in EPIC.  Records show that she visited our emergency room 10 days ago for pleuritic chest pain; she had no EKG changes at that time, and she was sent home with ibuprofen.  Patient says she visits her for this the doctor again today and he again gave her Percocet for chest pain and again sent her to the emergency room. In the emergency room she was found to be in atrial flutter with rapid ventricular response and because of ongoing chest pain cardiologist were contacted and arrangements made to transfer her to [facility]. No bed has become available at [facility] and the hospitalist service has been called for assistance.  The patient has had several hypoglycemic episodes while in the emergency room, but blood sugar testing confirms she is no longer hypoglycemic. She attributes her drowsiness to the Percocet which she has recently taken. She has received no sedating medications in the emergency room.  She currently reports feeling very weak, very sick and still short  of breath; She denies daytime somnolence when she is at baseline.  Her heart rate which has been as high as the 160s, has shown improved control with Cardizem infusion, and episodic boluses of Lopressor.  She denies fever cough or cold. She denies peripheral circulatory problems, but reports she does suffer with numbness of her feet, and more recently they have been swelling.  Denies fever, cough or cold.  Her tobacco use is down to one or 2 cigarettes per day, and she sometimes uses an albuterol inhaler for wheezing  She is 5 foot 5 inches tall and estimates her weight at 250 pounds, but she has not yet been weighed.  Rewiew of Systems:   She denies any other symptom on a complete review; but as previously noted this is unreliable.  All systems negative except as marked bold or noted in the HPI;  Constitutional:  malaise, fever and chills. ;  Eyes: eye pain, redness and discharge. ;  ENMT: ear pain, hoarseness, nasal congestion, sinus pressure and sore throat. ;  Cardiovascular:  chest pain, palpitations, diaphoresis, dyspnea and peripheral edema. ;  Respiratory:  cough, hemoptysis, wheezing and stridor. ;  Gastrointestinal:  nausea, vomiting, diarrhea, constipation, abdominal pain, melena, blood in stool, hematemesis, jaundice and rectal bleeding. unusual weight loss..   Genitourinary: r frequency, dysuria, incontinence,flank pain and hematuria; Musculoskeletal:  back pain and neck pain.  swelling and trauma.;  Skin: .  pruritus, rash, abrasions, bruising and skin lesion.; ulcerations Neuro:  headache, lightheadedness and neck stiffness.  weakness,  altered level of consciousness , altered mental status, extremity weakness, burning feet, numbness of feet involuntary movement, seizure and syncope.  Psych:  anxiety, depression, insomnia, tearfulness, panic attacks, hallucinations, paranoia, suicidal or homicidal ideation    Past Medical History  Diagnosis Date  . Asthma   .  Hypertension   . Diabetes mellitus without complication   . SLE (systemic lupus erythematosus)     History reviewed. No pertinent past surgical history.  Medications:  HOME MEDS: Prior to Admission medications   Medication Sig Start Date End Date Taking? Authorizing Provider  acetaminophen (TYLENOL) 500 MG tablet Take 1,000 mg by mouth every 6 (six) hours as needed. For pain   Yes Historical Provider, MD  albuterol (PROVENTIL HFA;VENTOLIN HFA) 108 (90 BASE) MCG/ACT inhaler Inhale 2 puffs into the lungs every 6 (six) hours as needed. For shortness of breath   Yes Historical Provider, MD  aspirin EC 81 MG tablet Take 81 mg by mouth daily.   Yes Historical Provider, MD  hydroxychloroquine (PLAQUENIL) 200 MG tablet Take 200 mg by mouth 2 (two) times daily.   Yes Historical Provider, MD  insulin glargine (LANTUS) 100 UNIT/ML injection Inject 70 Units into the skin at bedtime.   Yes Historical Provider, MD  Multiple Vitamins-Minerals (CENTRUM SILVER PO) Take 1 tablet by mouth daily.   Yes Historical Provider, MD  predniSONE (DELTASONE) 5 MG tablet Take 5 mg by mouth daily.   Yes Historical Provider, MD  PRESCRIPTION MEDICATION Take 1 tablet by mouth daily. Blood pressure medication- unknown name/strength. Sample given from Dr. Delanna Ahmadi office.   Yes Historical Provider, MD     Allergies:  No Known Allergies  Social History:   reports that she has been smoking.  She does not have any smokeless tobacco history on file. She reports that she does not drink alcohol or use illicit drugs.  Family History: Family History  Problem Relation Age of Onset  . Diabetes Sister   . Diabetes Brother   . Diabetes Father   . Hypertension Sister   . Hypertension Brother   . Hypertension Father   . Stroke Mother      Physical Exam: Filed Vitals:   09/14/12 2056 09/14/12 2100 09/14/12 2115 09/14/12 2130  BP: 133/95 136/47  126/84  Pulse: 107  46   Temp:      TempSrc:      Resp: 22 24 20 20    Height:      Weight:      SpO2: 99%  97%    Blood pressure 126/84, pulse 46, temperature 98.3 F (36.8 C), temperature source Oral, resp. rate 20, height 5\' 5"  (1.651 m), weight 113.399 kg (250 lb), SpO2 97.00%.  GEN:  Pleasant but drowsy morbidly obese African American lady sitting up in the stretcher in no acute distress; tries to be cooperative with exam PSYCH:  alert and oriented x3; does not appear anxious or depressed; affect is flat HEENT: Mucous membranes pink, dry and anicteric; PERRLA; EOM intact; no cervical lymphadenopathy nor thyromegaly or carotid bruit; Breasts:: Not examined CHEST WALL: No tenderness CHEST: Normal respiration, clear to auscultation bilaterally HEART: Irregularly irregular rhythm; distant heart sounds no murmurs rubs or gallops BACK: No kyphosis or scoliosis; no CVA tenderness ABDOMEN: Obese, soft non-tender; no masses, no organomegaly, normal abdominal bowel sounds; large pannus; no intertriginous candida. Rectal Exam: Not done EXTREMITIES:  age-appropriate arthropathy of the hands and knees; duskiness coloration of the feet; cold feet but good pulses; trace edema bilaterally; healing ulceration  of the left shin, secondary to trauma. Genitalia: not examined PULSES: 2+ and symmetric SKIN: Normal hydration no rash or ulceration other than noted above CNS: Cranial nerves 2-12 grossly intact no focal lateralizing neurologic deficit   Labs on Admission:  Basic Metabolic Panel:  Lab [id number]  NA 129*  K 4.7  CL 93*  CO2 27  GLUCOSE 57*  BUN 23  CREATININE 1.26*  CALCIUM 8.6  MG --  PHOS --   Liver Function Tests: No results found for this basename: AST:5,ALT:5,ALKPHOS:5,BILITOT:5,PROT:5,ALBUMIN:5 in the last 168 hours No results found for this basename: LIPASE:5,AMYLASE:5 in the last 168 hours No results found for this basename: AMMONIA:5 in the last 168 hours CBC:  Lab 09/14/12 1114  WBC 7.1  NEUTROABS --  HGB 8.0*  HCT 24.2*  MCV  84.9  PLT 395   Cardiac Enzymes:  Lab 09/14/12 2025 09/14/12 1114  CKTOTAL -- --  CKMB -- --  CKMBINDEX -- --  TROPONINI <0.30 <0.30   BNP: No components found with this basename: POCBNP:5 D-dimer: No components found with this basename: D-DIMER:5 CBG:  Lab 09/14/12 2150 09/14/12 1921 09/14/12 1618 09/14/12 1404 09/14/12 1349  GLUCAP 124* 61* 107* 70 <10*    Radiological Exams on Admission: Dg Chest Port 1 View  09/14/2012  *RADIOLOGY REPORT*  Clinical Data: Chest pain and tachycardia.  PORTABLE CHEST - 1 VIEW  Comparison: PA and lateral chest 09/04/2012.  Findings: Marked enlargement cardiopericardial silhouette is again seen.  There is vascular congestion without frank edema.  No focal airspace disease, pleural effusion or pneumothorax.  IMPRESSION: No change in marked enlargement of the cardiopericardial silhouette and pulmonary vascular congestion.   Original Report Authenticated By: Orlean Patten, M.D.     EKG: Independently reviewed. Atrial flutter with rapid ventricular response   Assessment/Plan Present on Admission:  . Atrial flutter with rapid ventricular response . Morbid obesity . Diabetes mellitus type 2 in obese . Neuropathy in diabetes . SLE (systemic lupus erythematosus) . Hypoglycemia associated with diabetes . HTN (hypertension) . COPD (chronic obstructive pulmonary disease) Decompensated diastolic heart failure   PLAN: We'll admit this lady to the step down unit for rate control with diltiazem infusion; Continue heparinization as ordered by cardiology We'll cycle cardiac enzymes;  will get a ABGs to assess respiratory function in view of not completely explained drowsiness; Will check d-dimer;  Consider BiPAP CPAP or CT angiogram of the chest depending on results of investigation Increase the dose of her prednisone as of her acute decompensation; Hold her Lantus initially but give sliding scale insulin until her diuretic status is more  stable; When necessary nebulizations with Xopenex and ipratropium Her decompensated heart failure is likely related to her atrial flutter, but will hydrate her since she appears dehydrated and we plan possible CT angiogram of the chest.  Other plans as per orders.  Code Status: FULL CODE  Family Communication: Husband present earlier is now left, and plans were not discussed with him; he knows of the intention to admit to [facility] Disposition Plan: Depending on response to therapy  Critical care time: 60 minutes.   Cylas Falzone Nocturnist [facility] Pager [phone]   09/14/2012, 10:38 PM

## 2012-09-15 ENCOUNTER — Encounter (HOSPITAL_COMMUNITY): Payer: Self-pay | Admitting: Intensive Care

## 2012-09-15 ENCOUNTER — Inpatient Hospital Stay (HOSPITAL_COMMUNITY): Payer: Medicare Other

## 2012-09-15 DIAGNOSIS — R590 Localized enlarged lymph nodes: Secondary | ICD-10-CM | POA: Diagnosis present

## 2012-09-15 DIAGNOSIS — I509 Heart failure, unspecified: Secondary | ICD-10-CM

## 2012-09-15 DIAGNOSIS — R079 Chest pain, unspecified: Secondary | ICD-10-CM | POA: Diagnosis present

## 2012-09-15 DIAGNOSIS — D649 Anemia, unspecified: Secondary | ICD-10-CM

## 2012-09-15 DIAGNOSIS — E875 Hyperkalemia: Secondary | ICD-10-CM | POA: Insufficient documentation

## 2012-09-15 DIAGNOSIS — E871 Hypo-osmolality and hyponatremia: Secondary | ICD-10-CM | POA: Insufficient documentation

## 2012-09-15 DIAGNOSIS — N289 Disorder of kidney and ureter, unspecified: Secondary | ICD-10-CM | POA: Diagnosis present

## 2012-09-15 DIAGNOSIS — I5033 Acute on chronic diastolic (congestive) heart failure: Secondary | ICD-10-CM | POA: Diagnosis present

## 2012-09-15 LAB — GLUCOSE, CAPILLARY
Glucose-Capillary: 104 mg/dL — ABNORMAL HIGH (ref 70–99)
Glucose-Capillary: 92 mg/dL (ref 70–99)

## 2012-09-15 LAB — CBC
Hemoglobin: 7.5 g/dL — ABNORMAL LOW (ref 12.0–15.0)
MCHC: 32.3 g/dL (ref 30.0–36.0)
WBC: 9.3 10*3/uL (ref 4.0–10.5)

## 2012-09-15 LAB — URINALYSIS, ROUTINE W REFLEX MICROSCOPIC
Nitrite: NEGATIVE
Specific Gravity, Urine: >1.03 — ABNORMAL HIGH (ref 1.005–1.030)
Urobilinogen, UA: 0.2 mg/dL (ref 0.0–1.0)

## 2012-09-15 LAB — MRSA PCR SCREENING: MRSA by PCR: NEGATIVE

## 2012-09-15 LAB — BASIC METABOLIC PANEL
BUN: 24 mg/dL — ABNORMAL HIGH (ref 6–23)
CO2: 19 mEq/L (ref 19–32)
Calcium: 8.5 mg/dL (ref 8.4–10.5)
Chloride: 94 mEq/L — ABNORMAL LOW (ref 96–112)
Creatinine, Ser: 1.49 mg/dL — ABNORMAL HIGH (ref 0.50–1.10)
GFR calc Af Amer: 39 mL/min — ABNORMAL LOW (ref >90)
GFR calc Af Amer: 41 mL/min — ABNORMAL LOW (ref >90)
GFR calc non Af Amer: 34 mL/min — ABNORMAL LOW (ref >90)
Potassium: 5.5 mEq/L — ABNORMAL HIGH (ref 3.5–5.1)
Sodium: 128 mEq/L — ABNORMAL LOW (ref 135–145)
Sodium: 128 mEq/L — ABNORMAL LOW (ref 135–145)

## 2012-09-15 LAB — MAGNESIUM
Magnesium: 2.3 mg/dL (ref 1.5–2.5)
Magnesium: 2.4 mg/dL (ref 1.5–2.5)

## 2012-09-15 LAB — URINE MICROSCOPIC-ADD ON

## 2012-09-15 LAB — TSH: TSH: 1.332 u[IU]/mL (ref 0.350–4.500)

## 2012-09-15 LAB — VITAMIN B12: Vitamin B-12: 1268 pg/mL — ABNORMAL HIGH (ref 211–911)

## 2012-09-15 LAB — CK TOTAL AND CKMB (NOT AT ARMC): Relative Index: INVALID (ref 0.0–2.5)

## 2012-09-15 LAB — TROPONIN I: Troponin I: <0.3 ng/mL (ref <0.30)

## 2012-09-15 LAB — HEPARIN LEVEL (UNFRACTIONATED): Heparin Unfractionated: 0.48 IU/mL (ref 0.30–0.70)

## 2012-09-15 MED ORDER — IPRATROPIUM BROMIDE 0.02 % IN SOLN
0.5000 mg | Freq: Three times a day (TID) | RESPIRATORY_TRACT | Status: DC
  Administered 2012-09-15 – 2012-09-19 (×12): 0.5 mg via RESPIRATORY_TRACT
  Filled 2012-09-15 (×13): qty 2.5

## 2012-09-15 MED ORDER — ACETAMINOPHEN 325 MG PO TABS
650.0000 mg | ORAL_TABLET | Freq: Four times a day (QID) | ORAL | Status: DC | PRN
  Administered 2012-09-20: 650 mg via ORAL
  Filled 2012-09-15: qty 2

## 2012-09-15 MED ORDER — HYDROCORTISONE SOD SUCCINATE 100 MG IJ SOLR
100.0000 mg | Freq: Once | INTRAMUSCULAR | Status: AC
Start: 2012-09-15 — End: 2012-09-15
  Administered 2012-09-15: 100 mg via INTRAVENOUS
  Filled 2012-09-15: qty 2

## 2012-09-15 MED ORDER — GLUCERNA SHAKE PO LIQD
237.0000 mL | Freq: Two times a day (BID) | ORAL | Status: DC
  Administered 2012-09-15 – 2012-09-20 (×5): 237 mL via ORAL

## 2012-09-15 MED ORDER — ALBUTEROL SULFATE (5 MG/ML) 0.5% IN NEBU: 2.5000 mg | INHALATION_SOLUTION | Freq: Three times a day (TID) | RESPIRATORY_TRACT | Status: DC

## 2012-09-15 MED ORDER — HYDROXYCHLOROQUINE SULFATE 200 MG PO TABS
ORAL_TABLET | ORAL | Status: AC
  Filled 2012-09-15: qty 1

## 2012-09-15 MED ORDER — LEVALBUTEROL HCL 0.63 MG/3ML IN NEBU
0.6300 mg | INHALATION_SOLUTION | Freq: Three times a day (TID) | RESPIRATORY_TRACT | Status: DC
  Administered 2012-09-15 – 2012-09-19 (×12): 0.63 mg via RESPIRATORY_TRACT
  Filled 2012-09-15 (×18): qty 3

## 2012-09-15 MED ORDER — IOHEXOL 350 MG/ML SOLN
100.0000 mL | Freq: Once | INTRAVENOUS | Status: AC | PRN
  Administered 2012-09-15: 100 mL via INTRAVENOUS

## 2012-09-15 MED ORDER — METOPROLOL TARTRATE 25 MG PO TABS
25.0000 mg | ORAL_TABLET | Freq: Two times a day (BID) | ORAL | Status: DC
Start: 2012-09-15 — End: 2012-09-15
  Administered 2012-09-15: 25 mg via ORAL
  Filled 2012-09-15: qty 1

## 2012-09-15 MED ORDER — SODIUM CHLORIDE 0.9 % IV SOLN
INTRAVENOUS | Status: DC
  Administered 2012-09-15: 10 mL/h via INTRAVENOUS

## 2012-09-15 MED ORDER — PREDNISONE 5 MG PO TABS
5.0000 mg | ORAL_TABLET | Freq: Every day | ORAL | Status: DC
  Administered 2012-09-16 – 2012-09-20 (×5): 5 mg via ORAL
  Filled 2012-09-15 (×6): qty 1

## 2012-09-15 NOTE — Progress Notes (Signed)
ANTICOAGULATION CONSULT NOTE - Initial Consult  Pharmacy Consult for Heparin Indication: chest pain/ACS  No Known Allergies  Patient Measurements: Height: 5\' 5"  (165.1 cm) Weight: 258 lb 6.1 oz (117.2 kg) IBW/kg (Calculated) : 57   Vital Signs: Temp: 97.9 F (36.6 C) (12/18 0800) Temp src: Oral (12/18 0800) BP: 108/83 mmHg (12/18 0915) Pulse Rate: 41  (12/18 0930)  Labs:  Basename 09/15/12 0508 09/14/12 2025 09/14/12 1114  HGB 7.5* -- 8.0*  HCT 23.2* -- 24.2*  PLT 391 -- 395  APTT -- -- --  LABPROT -- -- --  INR -- -- --  HEPARINUNFRC 0.48 -- --  CREATININE 1.54* -- 1.26*  CKTOTAL -- -- --  CKMB -- -- --  TROPONINI -- <0.30 <0.30    Estimated Creatinine Clearance: 46 ml/min (by C-G formula based on Cr of 1.54).   Medical History: Past Medical History  Diagnosis Date  . Asthma   . Hypertension   . Diabetes mellitus without complication   . SLE (systemic lupus erythematosus)     Medications:  Infusions:    . 0.9 % NaCl with KCl 20 mEq / L 20 mL/hr (09/15/12 1007)  . diltiazem (CARDIZEM) infusion 5 mg/hr (09/15/12 1032)  . heparin 1,600 Units (09/15/12 0900)    Assessment: 66yo obese female admitted last pm c/o chest discomfort and dyspnea.  Pt started on IV Heparin with 4000 unit bolus and 14 units/Kg/Hr infusion.  Heparin level is therapeutic this am.  Platelets are OK but H/H has dropped some. Goal of Therapy:  Heparin level 0.3-0.7 units/ml Monitor platelets by anticoagulation protocol: Yes   Plan: Continue Heparin at current rate Re-check level at 6pm to ensure therapeutic CBC per protocol Monitor for s/s of bleeding  [name], [name] A 09/15/2012,11:23 AM

## 2012-09-15 NOTE — Progress Notes (Signed)
UR Chart Review Completed  

## 2012-09-15 NOTE — Progress Notes (Addendum)
Nutrition Brief Note  Patient identified on the Malnutrition Screening Tool (MST) Report  Body mass index is 43.00 kg/(m^2). Pt meets criteria for Obesity Class III based on current BMI.   Wt Readings from Last 10 Encounters:  09/15/12 258 lb 6.1 oz (117.2 kg)  09/04/12 250 lb (113.399 kg)   Current diet order is CHO Modified/Low Sodium , patient is consuming approximately 50% of meals at this time. Labs and medications reviewed.  Pt drinks Glucerna at home and would like to continue while hospitalized. Will add Glucerna Shake po BID, each supplement provides 220 kcal and 10 grams of protein.  If nutrition issues arise, please consult RD.   [id number]

## 2012-09-15 NOTE — Progress Notes (Signed)
ANTICOAGULATION CONSULT NOTE  Pharmacy Consult for Heparin Indication: Aflutter  No Known Allergies  Patient Measurements: Height: 5\' 5"  (165.1 cm) Weight: 264 lb 12.4 oz (120.1 kg) IBW/kg (Calculated) : 57   Vital Signs: Temp: 98 F (36.7 C) (12/18 2027) Temp src: Oral (12/18 2027) BP: 116/82 mmHg (12/18 2200) Pulse Rate: 129  (12/18 2200)  Labs:  Basename 09/15/12 2226 09/15/12 2101 09/15/12 0508 09/14/12 2025 09/14/12 1114  HGB -- -- 7.5* -- 8.0*  HCT -- -- 23.2* -- 24.2*  PLT -- -- 391 -- 395  APTT -- -- -- -- --  LABPROT -- -- -- -- --  INR -- -- -- -- --  HEPARINUNFRC 0.48 -- 0.48 -- --  CREATININE -- 1.49* 1.54* -- 1.26*  CKTOTAL -- 98 -- -- --  CKMB -- 2.6 -- -- --  TROPONINI -- <0.30 -- <0.30 <0.30    Estimated Creatinine Clearance: 48.2 ml/min (by C-G formula based on Cr of 1.49).  Assessment: 66 yo  Female with Aflutter, awaiting DCCV, for Heparin  Goal of Therapy:  Heparin level 0.3-0.7 units/ml Monitor platelets by anticoagulation protocol: Yes   Plan: Continue Heparin at current rate  Garen Woolbright, Bronson Curb 09/15/2012,11:15 PM

## 2012-09-15 NOTE — Consult Note (Addendum)
[facility]       CONSULTATION NOTE   Reason for Consult: Atrial flutter  Requesting Physician: Conley Canal  Cardiologist: Former patient of Reather Littler  HPI: This is a 66 y.o. female with a past medical history significant for morbid obesity, tobacco abuse, systemic hypertension, type 2 diabetes mellitus, systemic lupus erythematosus with primarily cutaneous and joint manifestations and echo documentation of diastolic dysfunction in the past. She presents with progressive onset of dyspnea and atypical chest discomfort with occasional sensation of fluttering in her chest when she lies on her left side. She was found to have atrial flutter with rapid ventricular response. She has improved somewhat following rate control but remains considerably dyspneic. She has not had any recent fever chills cough hemoptysis or worsening of chronic lower edema and a CT angiogram performed in the emergency room did not show evidence of pulmonary embolism. She is currently on intravenous heparin and intravenous diltiazem.   Her electrocardiogram shows typical counterclockwise right atrial flutter as well as repolarization abnormalities in the lateral leads that are highly consistent with left ventricular hypertrophy and were seen on previous echocardiograms as well. The QT interval is prolonged but this is likely artifactual related to flutter waves. Laboratory tests show hyponatremia and mild hyperkalemia, as well as moderate anemia with a hemoglobin of 7.5 and normocytic indices. Recently she had a normal creatinine of 1.08 on December 7 today the creatinine is elevated at 1.54 likely due to diuretic therapy but also raising concern for possible contrast nephrotoxicity from the CT angiogram of her chest. Other laboratory values of note her albumin of 2.2, although her urinalysis shows only mild proteinuria. Her chest x-ray shows cardiomegaly but no overt heart failure. The CT of  her chest shows a small pericardial effusion and small bilateral pleural effusions as well as scattered lymphadenopathy.  PMHx:  Past Medical History  Diagnosis Date  . Asthma   . Hypertension   . Diabetes mellitus without complication   . SLE (systemic lupus erythematosus)    History reviewed. No pertinent past surgical history.  FAMHx: Family History  Problem Relation Age of Onset  . Diabetes Sister   . Diabetes Brother   . Diabetes Father   . Hypertension Sister   . Hypertension Brother   . Hypertension Father   . Stroke Mother     SOCHx:  reports that she has been smoking.  She does not have any smokeless tobacco history on file. She reports that she does not drink alcohol or use illicit drugs.  ALLERGIES: No Known Allergies  ROS: Constitutional: positive for fatigue, negative for chills, fevers and night sweats Eyes: negative Ears, nose, mouth, throat, and face: negative for epistaxis, hearing loss, hoarseness and sore throat Respiratory: positive for asthma, dyspnea on exertion and wheezing, negative for cough, hemoptysis and sputum Cardiovascular: positive for chest pain, dyspnea, irregular heart beat and lower extremity edema, negative for near-syncope and syncope Gastrointestinal: negative for constipation, diarrhea, melena, nausea and vomiting Genitourinary:negative for Menometrorrhagia; she is status post surgical menopause and complete hysterectomy and hematuria Integument/breast: negative Hematologic/lymphatic: negative for bleeding and easy bruising Musculoskeletal:positive for arthralgias Neurological: negative for dizziness, gait problems, seizures, speech problems and weakness Behavioral/Psych: negative Endocrine: positive for diabetic symptoms including polydipsia, polyuria and Recent episodes of hypoglycemia Allergic/Immunologic: negative  HOME MEDICATIONS: Prescriptions prior to admission  Medication Sig Dispense Refill  . acetaminophen (TYLENOL)  500 MG tablet Take 1,000 mg by mouth every 6 (six) hours  as needed. For pain      . albuterol (PROVENTIL HFA;VENTOLIN HFA) 108 (90 BASE) MCG/ACT inhaler Inhale 2 puffs into the lungs every 6 (six) hours as needed. For shortness of breath      . aspirin EC 81 MG tablet Take 81 mg by mouth daily.      . hydroxychloroquine (PLAQUENIL) 200 MG tablet Take 200 mg by mouth 2 (two) times daily.      . insulin glargine (LANTUS) 100 UNIT/ML injection Inject 70 Units into the skin at bedtime.      . Multiple Vitamins-Minerals (CENTRUM SILVER PO) Take 1 tablet by mouth daily.      . predniSONE (DELTASONE) 5 MG tablet Take 5 mg by mouth daily.      Marland Kitchen PRESCRIPTION MEDICATION Take 1 tablet by mouth daily. Blood pressure medication- unknown name/strength. Sample given from Dr. Delanna Ahmadi office.        HOSPITAL MEDICATIONS: I have reviewed the patient's current medications.  VITALS: Blood pressure 67/50, pulse 54, temperature 97.9 F (36.6 C), temperature source Oral, resp. rate 19, height 5\' 5"  (1.651 m), weight 117.2 kg (258 lb 6.1 oz), SpO2 100.00%.  PHYSICAL EXAM: General appearance: alert, cooperative and mild distress Neck: no adenopathy, no carotid bruit, supple, symmetrical, trachea midline, thyroid not enlarged, symmetric, no tenderness/mass/nodules and Jugular veins cannot be examined due to obesity; prominent acanthosis Lungs: Scattered wheezes bilaterally Heart: Irregular rhythm normal heart sounds and no rubs murmurs or gallops Abdomen: Obesity precludes examination but no major abnormalities are identified Extremities: 1+ symmetrical pretibial edema Pulses: 2+ symmetrical radial ulnar and dorsalis pedis pulses Skin: Acantosis Neurologic: Strabismus but no other gross focal neurological abnormalities  LABS: Results for orders placed during the hospital encounter of 09/14/12 (from the past 48 hour(s))  CBC     Status: Abnormal   Collection Time   09/14/12 11:14 AM      Component Value  Range Comment   WBC 7.1  4.0 - 10.5 K/uL    RBC 2.85 (*) 3.87 - 5.11 MIL/uL    Hemoglobin 8.0 (*) 12.0 - 15.0 g/dL    HCT 24.2 (*) 36.0 - 46.0 %    MCV 84.9  78.0 - 100.0 fL    MCH 28.1  26.0 - 34.0 pg    MCHC 33.1  30.0 - 36.0 g/dL    RDW 14.9  11.5 - 15.5 %    Platelets 395  150 - 400 K/uL   BASIC METABOLIC PANEL     Status: Abnormal   Collection Time   09/14/12 11:14 AM      Component Value Range Comment   Sodium 129 (*) 135 - 145 mEq/L    Potassium 4.7  3.5 - 5.1 mEq/L    Chloride 93 (*) 96 - 112 mEq/L    CO2 27  19 - 32 mEq/L    Glucose, Bld 57 (*) 70 - 99 mg/dL    BUN 23  6 - 23 mg/dL    Creatinine, Ser 1.26 (*) 0.50 - 1.10 mg/dL    Calcium 8.6  8.4 - 10.5 mg/dL    GFR calc non Af Amer 43 (*) >90 mL/min    GFR calc Af Amer 50 (*) >90 mL/min   TROPONIN I     Status: Normal   Collection Time   09/14/12 11:14 AM      Component Value Range Comment   Troponin I <0.30  <0.30 ng/mL   GLUCOSE, CAPILLARY     Status: Abnormal  Collection Time   09/14/12  1:45 PM      Component Value Range Comment   Glucose-Capillary <10 (*) 70 - 99 mg/dL   GLUCOSE, CAPILLARY     Status: Abnormal   Collection Time   09/14/12  1:49 PM      Component Value Range Comment   Glucose-Capillary <10 (*) 70 - 99 mg/dL   GLUCOSE, CAPILLARY     Status: Normal   Collection Time   09/14/12  2:04 PM      Component Value Range Comment   Glucose-Capillary 70  70 - 99 mg/dL   GLUCOSE, CAPILLARY     Status: Abnormal   Collection Time   09/14/12  4:18 PM      Component Value Range Comment   Glucose-Capillary 107 (*) 70 - 99 mg/dL   GLUCOSE, CAPILLARY     Status: Abnormal   Collection Time   09/14/12  7:21 PM      Component Value Range Comment   Glucose-Capillary 61 (*) 70 - 99 mg/dL   TROPONIN I     Status: Normal   Collection Time   09/14/12  8:25 PM      Component Value Range Comment   Troponin I <0.30  <0.30 ng/mL   GLUCOSE, CAPILLARY     Status: Abnormal   Collection Time   09/14/12  9:50 PM       Component Value Range Comment   Glucose-Capillary 124 (*) 70 - 99 mg/dL   PRO B NATRIURETIC PEPTIDE     Status: Abnormal   Collection Time   09/14/12 10:06 PM      Component Value Range Comment   Pro B Natriuretic peptide (BNP) 4230.0 (*) 0 - 125 pg/mL   HEPATIC FUNCTION PANEL     Status: Abnormal   Collection Time   09/14/12 10:07 PM      Component Value Range Comment   Total Protein 7.5  6.0 - 8.3 g/dL    Albumin 2.2 (*) 3.5 - 5.2 g/dL    AST 16  0 - 37 U/L    ALT 18  0 - 35 U/L    Alkaline Phosphatase 79  39 - 117 U/L    Total Bilirubin 0.2 (*) 0.3 - 1.2 mg/dL    Bilirubin, Direct 0.1  0.0 - 0.3 mg/dL    Indirect Bilirubin 0.1 (*) 0.3 - 0.9 mg/dL   BLOOD GAS, ARTERIAL     Status: Abnormal   Collection Time   09/14/12 10:17 PM      Component Value Range Comment   O2 Content 2.0      Delivery systems NASAL CANNULA      pH, Arterial 7.431  7.350 - 7.450    pCO2 arterial 36.2  35.0 - 45.0 mmHg    pO2, Arterial 129.0 (*) 80.0 - 100.0 mmHg    Bicarbonate 23.7  20.0 - 24.0 mEq/L    TCO2 21.2  0 - 100 mmol/L    Acid-Base Excess 0.1  0.0 - 2.0 mmol/L    O2 Saturation 99.2      Collection site RIGHT RADIAL      Drawn by [id number]      Sample type ARTERIAL      Allens test (pass/fail) PASS  PASS   D-DIMER, QUANTITATIVE     Status: Abnormal   Collection Time   09/14/12 10:37 PM      Component Value Range Comment   D-Dimer, Quant 10.72 (*) 0.00 - 0.48 ug/mL-FEU  MAGNESIUM     Status: Normal   Collection Time   09/15/12 12:00 AM      Component Value Range Comment   Magnesium 2.4  1.5 - 2.5 mg/dL   MRSA PCR SCREENING     Status: Normal   Collection Time   09/15/12 12:55 AM      Component Value Range Comment   MRSA by PCR NEGATIVE  NEGATIVE   GLUCOSE, CAPILLARY     Status: Abnormal   Collection Time   09/15/12  1:08 AM      Component Value Range Comment   Glucose-Capillary 104 (*) 70 - 99 mg/dL    Comment 1 Notify RN     URINALYSIS, ROUTINE W REFLEX MICROSCOPIC      Status: Abnormal   Collection Time   09/15/12  1:32 AM      Component Value Range Comment   Color, Urine YELLOW  YELLOW    APPearance HAZY (*) CLEAR    Specific Gravity, Urine >1.030 (*) 1.005 - 1.030    pH 5.5  5.0 - 8.0    Glucose, UA NEGATIVE  NEGATIVE mg/dL    Hgb urine dipstick NEGATIVE  NEGATIVE    Bilirubin Urine NEGATIVE  NEGATIVE    Ketones, ur NEGATIVE  NEGATIVE mg/dL    Protein, ur 30 (*) NEGATIVE mg/dL    Urobilinogen, UA 0.2  0.0 - 1.0 mg/dL    Nitrite NEGATIVE  NEGATIVE    Leukocytes, UA SMALL (*) NEGATIVE   URINE MICROSCOPIC-ADD ON     Status: Abnormal   Collection Time   09/15/12  1:32 AM      Component Value Range Comment   Squamous Epithelial / LPF MANY (*) RARE    WBC, UA 0-2  <3 WBC/hpf    RBC / HPF 3-6  <3 RBC/hpf    Bacteria, UA MANY (*) RARE    Casts HYALINE CASTS (*) NEGATIVE    Urine-Other TRICHOMONAS PRESENT     HEPARIN LEVEL (UNFRACTIONATED)     Status: Normal   Collection Time   09/15/12  5:08 AM      Component Value Range Comment   Heparin Unfractionated 0.48  0.30 - 0.70 IU/mL   BASIC METABOLIC PANEL     Status: Abnormal   Collection Time   09/15/12  5:08 AM      Component Value Range Comment   Sodium 128 (*) 135 - 145 mEq/L    Potassium 5.5 (*) 3.5 - 5.1 mEq/L    Chloride 94 (*) 96 - 112 mEq/L    CO2 25  19 - 32 mEq/L    Glucose, Bld 90  70 - 99 mg/dL    BUN 24 (*) 6 - 23 mg/dL    Creatinine, Ser 1.54 (*) 0.50 - 1.10 mg/dL    Calcium 8.1 (*) 8.4 - 10.5 mg/dL    GFR calc non Af Amer 34 (*) >90 mL/min    GFR calc Af Amer 39 (*) >90 mL/min   CBC     Status: Abnormal   Collection Time   09/15/12  5:08 AM      Component Value Range Comment   WBC 9.3  4.0 - 10.5 K/uL    RBC 2.71 (*) 3.87 - 5.11 MIL/uL    Hemoglobin 7.5 (*) 12.0 - 15.0 g/dL    HCT 23.2 (*) 36.0 - 46.0 %    MCV 85.6  78.0 - 100.0 fL    MCH 27.7  26.0 - 34.0 pg  MCHC 32.3  30.0 - 36.0 g/dL    RDW 15.1  11.5 - 15.5 %    Platelets 391  150 - 400 K/uL   GLUCOSE, CAPILLARY      Status: Normal   Collection Time   09/15/12  7:41 AM      Component Value Range Comment   Glucose-Capillary 92  70 - 99 mg/dL     IMAGING: Ct Angio Chest Pe W/cm &/or Wo Cm  09/15/2012  *RADIOLOGY REPORT*  Clinical Data: New onset atrial flutter.  CT ANGIOGRAPHY CHEST  Technique:  Multidetector CT imaging of the chest using the standard protocol during bolus administration of intravenous contrast. Multiplanar reconstructed images including MIPs were obtained and reviewed to evaluate the vascular anatomy.  Contrast: 138mL OMNIPAQUE IOHEXOL 350 MG/ML SOLN  Comparison: 09/14/2012 radiograph, 01/11/2004 CT  Findings: Pulmonary arterial branches are patent.  Cardiomegaly. Pericardial effusion measures water attenuation.  There are bilateral pleural effusions, small however with mild loculation.  Limited images through the upper abdomen show no acute finding.  Lymphadenopathy.  Right subcarinal node measuring 1.9 cm short axis and right superior mediastinal para esophageal lymph node measuring 1.7 cm short axis.  Scattered atherosclerosis of the aorta and branch vessels.  Small hiatal hernia.  Central airways are patent.  Mild bullous changes and peripheral reticular opacities.  Mild bibasilar opacities associated with the pleural fluid.  Nonspecific and lingular consolidation. No pneumothorax.  No acute osseous finding.  IMPRESSION: No pulmonary embolism.  Cardiomegaly with small pericardial effusion and bilateral pleural effusions.  Mediastinal lymphadenopathy.  May be reactive in the setting of lupus.  Lymphoma or malignancy not excluded.  Lymph nodes have enlarged from 2005.  Consider tissue sampling.  Mild background emphysema and interstitial lung disease.   Original Report Authenticated By: Carlos Levering, M.D.    Dg Chest Port 1 View  09/14/2012  *RADIOLOGY REPORT*  Clinical Data: Chest pain and tachycardia.  PORTABLE CHEST - 1 VIEW  Comparison: PA and lateral chest 09/04/2012.  Findings:  Marked enlargement cardiopericardial silhouette is again seen.  There is vascular congestion without frank edema.  No focal airspace disease, pleural effusion or pneumothorax.  IMPRESSION: No change in marked enlargement of the cardiopericardial silhouette and pulmonary vascular congestion.   Original Report Authenticated By: Orlean Patten, M.D.     IMPRESSION: 1. Atrial flutter with rapid ventricular response complicated by acute on chronic diastolic heart failure. She remains symptomatic despite good rate control and there is evidence of acute be worsening renal function in the face of diuretics. He should benefit from cardioversion to normal sinus rhythm. This cannot be safely done since the duration of the arrhythmia is unknown and likely quite lengthy. We discussed the procedure transesophageal echocardiography followed by synchronized electrocardioversion. The risks and benefits of this were discussed in some detail. Also reviewed the risks of moderate sedation. She understands and agrees to proceed. In the sagittal have transferred to [place] for this procedure to be performed. 2. Her review of systems describes early morning fatigue easy fatigability daytime napping and it is highly likely that she has obstructive sleep apnea. This is likely the physiological substrate for a suspect his right heart overload as a cause for atrial flutter. A formal sleep study will be necessary. Anesthesiology support will be used for any sedation base procedures. 3. Her anemia needs to be investigated. It should be noted that as far back as 2008 her hemoglobin was abnormal at 9.7. Her hemoglobin has dropped by  roughly 1.8 g/dL just since December 7 but there has been no overt bleeding 4. She has acute renal failure which may simply be due to diuretic therapy and reduced cardiac output in the setting of atrial flutter but contrast nephrotoxicity from the recent CT entered the chest needs to be  considered. 5. Recent episodes of hypoglycemia while on insulin therapy noted.   [facility] 09/15/2012, 9:25 AM

## 2012-09-15 NOTE — H&P (Signed)
H&P addendum  See Dr. Victorino December consult note from earlier today.  Pt just arrived at [facility], no chest pain but SOB.  This is due to exertion.  Pt was transferred by EMS.  She is for TEE DCCV in AM.  Will be NPO after midnight.  2D Echo done today:  Left ventricle: The cavity size was normal. Wall thickness was increased in a pattern of moderate LVH. There was moderate concentric hypertrophy, with an appearance suggesting concentric remodeling (increased wall thickness with normal wall mass). Systolic function was probably normal. The estimated ejection fraction was in the range of 55% to 60%. Although no diagnostic regional wall motion abnormality was identified, this possibility cannot be completely excluded on the basis of this study. Doppler parameters are consistent with elevated mean left atrial filling pressure. - Aortic valve: Cusp separation was mildly reduced. There was probablymild stenosis. Mild to moderate regurgitation. - Mitral valve: Calcification. Mild regurgitation. - Left atrium: The atrium was mildly dilated. - Right ventricle: The cavity size was mildly dilated. Wall thickness was normal. Systolic function was mildly reduced. - Pulmonary arteries: PA peak pressure: ~90mm Hg based upon an estimated RAP/CVP of 10 mmHg. - Pericardium, extracardiac: A trivial pericardial effusion was identified posterior to the heart. Impressions:  - An atrial fibrillation rhythm was noted on this study, making this study technically difficult for the assessment of cardiac function. The right ventricular systolic pressure was increased consistent with moderatepulmonary hypertension based upon an estimated CVP of 10 mmHg (the IVC was not adequately visualized to accurate estimate RAP/CVP)  Also noted with VS today that BP was a low 75/60 but prior to transfer up to 144/83.  On IV Dilt and IV Heparin.  BP 129/56, HR controlled A. Flutter, resp. Improved with rest. General:alert  and oriented, obviously SOB after exertion from stretcher to bed, pleasant affect. Skin:warm and dry, brisk, capillary refill HEENT:normocephalic, sclera clear Neck:supple, unable to determine JVD due to obesity Heart:irreg irreg, no murmur gallup or rub Lungs:decreased breath sounds no wheezes, occ crackles [id number], soft, non tender Ext:1+ edema bil 1+ pedal pulse Neuro:alert and oriented X 3 MAE, follows commands  Assessment: Active Problems:  Atrial flutter with rapid ventricular response  Morbid obesity  Diabetes mellitus type 2 in obese  Neuropathy in diabetes  SLE (systemic lupus erythematosus)  Hypoglycemia associated with diabetes  HTN (hypertension)  COPD (chronic obstructive pulmonary disease)  Hyperkalemia  Anemia  Mediastinal lymphadenopathy  Hyponatremia  Acute CHF  Chest pain  Acute renal insufficiency   Plan: Continue cardizem as tolerated for rate control, continue Heparin, plan for TEE DCCV in AM.  Continue O2.  CT angio of chest was neg for PE with ddimer 10.72  Pro BNP was 4230 on admit.  K+ was elevated this am will recheck along with troponin and ckmb. Will check Mg+ level also. TSH stable.

## 2012-09-15 NOTE — Progress Notes (Signed)
*  PRELIMINARY RESULTS* Echocardiogram 2D Echocardiogram has been performed.  [name] 09/15/2012, 1:57 PM

## 2012-09-15 NOTE — Progress Notes (Signed)
EMS here to transport pt to [facility] to room 3302 per MD order. Report given to EMS. Report called to RN on [id number].

## 2012-09-15 NOTE — Progress Notes (Signed)
Per [facility] unable to transport to [facility]. To try calling EMS for transport. [facility] able to transport pt.

## 2012-09-15 NOTE — Progress Notes (Addendum)
Chart reviewed.  Subjective: Patient reports she is seen cardiology "right near [place]". No chest pain currently. Slight shortness of breath. Swelling present. No palpitations. Legs are swollen. Prednisone dose has not changed recently. Takes 5 mg daily. Reports occasional nighttime hypoglycemia.  Objective: Vital signs in last 24 hours: Filed Vitals:   09/15/12 0400 09/15/12 0500 09/15/12 0600 09/15/12 0717  BP:  137/51 61/48   Pulse: 60 35 54   Temp: 98.5 F (36.9 C)     TempSrc: Oral     Resp: 22 18 21    Height:      Weight:      SpO2: 100% 89% 100% 100%   Weight change:   Intake/Output Summary (Last 24 hours) at 09/15/12 [id number] Last data filed at 09/15/12 0600  Gross per 24 hour  Intake 689.66 ml  Output    300 ml  Net 389.66 ml   Telemetry: Atrial flutter with variable conduction  General: Sitting at the edge of the bed. Obese. EEG breakfast. Slightly winded with talking. Neck: Difficult to assess for JVD do to body habitus. Supple. Lungs: Diminished throughout without wheezes rhonchi or rales Cardiovascular: Irregularly irregular distant heart tones. Abdomen soft nontender obese Extremities 2+ pitting edema bilaterally  Lab Results: Basic Metabolic Panel:  Lab [id number] 09/15/12 09/14/12 1114  NA 128* -- 129*  K 5.5* -- 4.7  CL 94* -- 93*  CO2 25 -- 27  GLUCOSE 90 -- 57*  BUN 24* -- 23  CREATININE 1.54* -- 1.26*  CALCIUM 8.1* -- 8.6  MG -- 2.4 --  PHOS -- -- --   Liver Function Tests:  Lab 09/14/12 2207  AST 16  ALT 18  ALKPHOS 79  BILITOT 0.2*  PROT 7.5  ALBUMIN 2.2*   No results found for this basename: LIPASE:2,AMYLASE:2 in the last 168 hours No results found for this basename: AMMONIA:2 in the last 168 hours CBC:  Lab 09/15/12 0508 09/14/12 1114  WBC 9.3 7.1  NEUTROABS -- --  HGB 7.5* 8.0*  HCT 23.2* 24.2*  MCV 85.6 84.9  PLT 391 395   Cardiac Enzymes:  Lab 09/14/12 2025 09/14/12 1114  CKTOTAL -- --  CKMB -- --  CKMBINDEX --  --  TROPONINI <0.30 <0.30   BNP:  Lab 09/14/12 2206  PROBNP 4230.0*   D-Dimer:  Lab 09/14/12 2237  DDIMER 10.72*   CBG:  Lab 09/15/12 0741 09/15/12 0108 09/14/12 2150 09/14/12 1921 09/14/12 1618 09/14/12 1404  GLUCAP 92 104* 124* 61* 107* 70   Hemoglobin A1C: No results found for this basename: HGBA1C in the last 168 hours Fasting Lipid Panel: No results found for this basename: CHOL,HDL,LDLCALC,TRIG,CHOLHDL,LDLDIRECT in the last 168 hours Thyroid Function Tests: No results found for this basename: TSH,T4TOTAL,FREET4,T3FREE,THYROIDAB in the last 168 hours Coagulation: No results found for this basename: LABPROT:4,INR:4 in the last 168 hours Anemia Panel: No results found for this basename: VITAMINB12,FOLATE,FERRITIN,TIBC,IRON,RETICCTPCT in the last 168 hours Urine Drug Screen: Drugs of Abuse  No results found for this basename: labopia, cocainscrnur, labbenz, amphetmu, thcu, labbarb    Alcohol Level: No results found for this basename: ETH:2 in the last 168 hours Urinalysis:  Lab 09/15/12 0132  COLORURINE YELLOW  LABSPEC >1.030*  PHURINE 5.5  GLUCOSEU NEGATIVE  HGBUR NEGATIVE  BILIRUBINUR NEGATIVE  KETONESUR NEGATIVE  PROTEINUR 30*  UROBILINOGEN 0.2  NITRITE NEGATIVE  LEUKOCYTESUR SMALL*   Micro Results: Recent Results (from the past 240 hour(s))  MRSA PCR SCREENING     Status: Normal  Collection Time   09/15/12 12:55 AM      Component Value Range Status Comment   MRSA by PCR NEGATIVE  NEGATIVE Final    Studies/Results: Ct Angio Chest Pe W/cm &/or Wo Cm  09/15/2012  *RADIOLOGY REPORT*  Clinical Data: New onset atrial flutter.  CT ANGIOGRAPHY CHEST  Technique:  Multidetector CT imaging of the chest using the standard protocol during bolus administration of intravenous contrast. Multiplanar reconstructed images including MIPs were obtained and reviewed to evaluate the vascular anatomy.  Contrast: 142mL OMNIPAQUE IOHEXOL 350 MG/ML SOLN  Comparison: 09/14/2012  radiograph, 01/11/2004 CT  Findings: Pulmonary arterial branches are patent.  Cardiomegaly. Pericardial effusion measures water attenuation.  There are bilateral pleural effusions, small however with mild loculation.  Limited images through the upper abdomen show no acute finding.  Lymphadenopathy.  Right subcarinal node measuring 1.9 cm short axis and right superior mediastinal para esophageal lymph node measuring 1.7 cm short axis.  Scattered atherosclerosis of the aorta and branch vessels.  Small hiatal hernia.  Central airways are patent.  Mild bullous changes and peripheral reticular opacities.  Mild bibasilar opacities associated with the pleural fluid.  Nonspecific and lingular consolidation. No pneumothorax.  No acute osseous finding.  IMPRESSION: No pulmonary embolism.  Cardiomegaly with small pericardial effusion and bilateral pleural effusions.  Mediastinal lymphadenopathy.  May be reactive in the setting of lupus.  Lymphoma or malignancy not excluded.  Lymph nodes have enlarged from 2005.  Consider tissue sampling.  Mild background emphysema and interstitial lung disease.   Original Report Authenticated By: Carlos Levering, M.D.    Dg Chest Port 1 View  09/14/2012  *RADIOLOGY REPORT*  Clinical Data: Chest pain and tachycardia.  PORTABLE CHEST - 1 VIEW  Comparison: PA and lateral chest 09/04/2012.  Findings: Marked enlargement cardiopericardial silhouette is again seen.  There is vascular congestion without frank edema.  No focal airspace disease, pleural effusion or pneumothorax.  IMPRESSION: No change in marked enlargement of the cardiopericardial silhouette and pulmonary vascular congestion.   Original Report Authenticated By: Orlean Patten, M.D.    Scheduled Meds:   . aspirin EC  81 mg Oral Daily  . hydroxychloroquine  200 mg Oral BID  . insulin aspart  0-15 Units Subcutaneous TID WC  . insulin aspart  0-5 Units Subcutaneous QHS  . metoprolol tartrate  25 mg Oral BID  . predniSONE  10  mg Oral Daily   Continuous Infusions:   . 0.9 % NaCl with KCl 20 mEq / L 100 mL/hr at 09/15/12 0600  . diltiazem (CARDIZEM) infusion 5 mg/hr (09/15/12 0600)  . heparin 1,600 Units (09/15/12 0600)   PRN Meds:.acetaminophen, ipratropium, levalbuterol, ondansetron (ZOFRAN) IV, sorbitol Assessment/Plan: Active Problems:  Atrial flutter with rapid ventricular response, new. Rate better controlled. Blood pressure readings unreliable overnight. Currently about [id number] systolic. Patient sees [facility], not [facility]. Consult [facility]. Continue Cardizem drip. Stop oral metoprolol, as blood pressures may be borderline low. Echocardiogram pending. Ejection fraction in 2011 was normal. TSH pending. Patient is on heparin drip.   Hypoglycemia associated with diabetes improved. Lantus held.   Anemia, normocytic. Apparently this is chronic, per records from 2008. She had a colonoscopy at that time which showed polyps. Will Hemoccult stools and check an anemia panel. She may require transfusion if her hemoglobin drops further.   Acute CHF, new. May be related to atrial flutter and anemia. Await echocardiogram. Stop IV fluids. Give Lasix as blood pressure will tolerate.  Diabetes mellitus type 2 in obese, Lantus held.   SLE (systemic lupus erythematosus), on chronic prednisone, 5 mg. Currently on 10 mg. With hyponatremia, hyperkalemia, possible hypotension, hypoglycemia, could be adrenally insufficient in this setting of acute illness. Will give a single dose of stress dose steroids and monitor.  HTN (hypertension)   Hyperkalemia, mild. Will repeat labs today and treat if any higher.   Hyponatremia: See above   Chest pain, MI ruled out. CT angiogram of the chest shows no pulmonary embolus. Stress test in 2011 negative for ischemia.   Morbid obesity   Neuropathy in diabetes   COPD (chronic obstructive pulmonary disease), stable   Mediastinal  lymphadenopathy, could be related to lupus. Defer outpatient evaluation to primary care provider, Dr. Hilma Favors.  Acute renal insufficiency, multifactorial. Baseline creatinine several years ago was normal. Monitor.   LOS: 1 day   Taliah Porche L 09/15/2012, 8:32 AM  Discussed with Dr. Sallyanne Kuster. He is here evaluating the patient. Will defer diuretics to him. Disposition, i.e. transfer to [facility] versus keep at [facility] per him.

## 2012-09-16 ENCOUNTER — Encounter (HOSPITAL_COMMUNITY): Payer: Self-pay

## 2012-09-16 ENCOUNTER — Encounter (HOSPITAL_COMMUNITY): Payer: Self-pay | Admitting: Anesthesiology

## 2012-09-16 ENCOUNTER — Inpatient Hospital Stay (HOSPITAL_COMMUNITY): Payer: Medicare Other | Admitting: Anesthesiology

## 2012-09-16 ENCOUNTER — Encounter (HOSPITAL_COMMUNITY)
Admission: EM | Disposition: A | Discharge status: Home / Self Care | Payer: Self-pay | Source: Home / Self Care | Attending: Cardiovascular Disease

## 2012-09-16 ENCOUNTER — Ambulatory Visit (HOSPITAL_COMMUNITY): Admission: RE | Admit: 2012-09-16 | Payer: Medicare Other | Source: Ambulatory Visit | Admitting: Cardiovascular Disease

## 2012-09-16 ENCOUNTER — Other Ambulatory Visit: Payer: Self-pay

## 2012-09-16 HISTORY — PX: TRANSESOPHAGEAL ECHOCARDIOGRAM WITH CARDIOVERSION: SHX6140

## 2012-09-16 HISTORY — PX: CARDIOVERSION: SHX1299

## 2012-09-16 HISTORY — PX: TEE WITHOUT CARDIOVERSION: SHX5443

## 2012-09-16 LAB — CBC
HCT: 23.3 % — ABNORMAL LOW (ref 36.0–46.0)
Hemoglobin: 7.5 g/dL — ABNORMAL LOW (ref 12.0–15.0)
MCHC: 32.2 g/dL (ref 30.0–36.0)
RBC: 2.72 MIL/uL — ABNORMAL LOW (ref 3.87–5.11)
WBC: 11.2 10*3/uL — ABNORMAL HIGH (ref 4.0–10.5)

## 2012-09-16 LAB — BASIC METABOLIC PANEL
BUN: 29 mg/dL — ABNORMAL HIGH (ref 6–23)
CO2: 24 mEq/L (ref 19–32)
Chloride: 96 mEq/L (ref 96–112)
Creatinine, Ser: 1.66 mg/dL — ABNORMAL HIGH (ref 0.50–1.10)
Glucose, Bld: 80 mg/dL (ref 70–99)
Potassium: 4.8 mEq/L (ref 3.5–5.1)

## 2012-09-16 LAB — GLUCOSE, CAPILLARY
Glucose-Capillary: 135 mg/dL — ABNORMAL HIGH (ref 70–99)
Glucose-Capillary: 80 mg/dL (ref 70–99)
Glucose-Capillary: 115 mg/dL — ABNORMAL HIGH (ref 70–99)

## 2012-09-16 SURGERY — ECHOCARDIOGRAM, TRANSESOPHAGEAL
Anesthesia: Monitor Anesthesia Care

## 2012-09-16 MED ORDER — ZOLPIDEM TARTRATE 5 MG PO TABS
5.0000 mg | ORAL_TABLET | Freq: Every evening | ORAL | Status: DC | PRN
  Administered 2012-09-19: 5 mg via ORAL
  Filled 2012-09-16: qty 1

## 2012-09-16 MED ORDER — RIVAROXABAN 15 MG PO TABS
15.0000 mg | ORAL_TABLET | Freq: Every day | ORAL | Status: DC
  Administered 2012-09-16 – 2012-09-18 (×3): 15 mg via ORAL
  Filled 2012-09-16 (×4): qty 1

## 2012-09-16 MED ORDER — MIDAZOLAM HCL 10 MG/2ML IJ SOLN
INTRAMUSCULAR | Status: DC | PRN
  Administered 2012-09-16 (×2): 2 mg via INTRAVENOUS

## 2012-09-16 MED ORDER — TRAMADOL HCL 50 MG PO TABS
50.0000 mg | ORAL_TABLET | Freq: Four times a day (QID) | ORAL | Status: DC | PRN
  Filled 2012-09-16: qty 1

## 2012-09-16 MED ORDER — ALPRAZOLAM 0.25 MG PO TABS
0.2500 mg | ORAL_TABLET | Freq: Three times a day (TID) | ORAL | Status: DC | PRN
  Administered 2012-09-18: 0.25 mg via ORAL
  Filled 2012-09-16: qty 1

## 2012-09-16 MED ORDER — BUTAMBEN-TETRACAINE-BENZOCAINE 2-2-14 % EX AERO
INHALATION_SPRAY | CUTANEOUS | Status: DC | PRN
  Administered 2012-09-16: 2 via TOPICAL

## 2012-09-16 MED ORDER — FENTANYL CITRATE 0.05 MG/ML IJ SOLN
INTRAMUSCULAR | Status: DC | PRN
  Administered 2012-09-16 (×2): 25 ug via INTRAVENOUS

## 2012-09-16 MED ORDER — FUROSEMIDE 10 MG/ML IJ SOLN
40.0000 mg | Freq: Once | INTRAMUSCULAR | Status: AC
  Administered 2012-09-16: 40 mg via INTRAVENOUS
  Filled 2012-09-16: qty 4

## 2012-09-16 MED ORDER — RIVAROXABAN 20 MG PO TABS: 20.0000 mg | ORAL_TABLET | Freq: Every day | ORAL | Status: DC

## 2012-09-16 MED ORDER — SODIUM CHLORIDE 0.9 % IV SOLN
INTRAVENOUS | Status: DC
  Administered 2012-09-16: 500 mL via INTRAVENOUS

## 2012-09-16 MED ORDER — PROPOFOL 10 MG/ML IV BOLUS
INTRAVENOUS | Status: DC | PRN
  Administered 2012-09-16: 70 mg via INTRAVENOUS

## 2012-09-16 MED ORDER — FERROUS SULFATE 325 (65 FE) MG PO TABS
325.0000 mg | ORAL_TABLET | Freq: Three times a day (TID) | ORAL | Status: DC
  Administered 2012-09-16 – 2012-09-19 (×9): 325 mg via ORAL
  Filled 2012-09-16 (×12): qty 1

## 2012-09-16 MED ORDER — SODIUM CHLORIDE 0.9 % IV SOLN
INTRAVENOUS | Status: DC | PRN
  Administered 2012-09-16: 13:00:00 via INTRAVENOUS

## 2012-09-16 MED ORDER — FUROSEMIDE 40 MG PO TABS
40.0000 mg | ORAL_TABLET | Freq: Every day | ORAL | Status: DC
  Administered 2012-09-17 – 2012-09-20 (×4): 40 mg via ORAL
  Filled 2012-09-16 (×4): qty 1

## 2012-09-16 NOTE — Progress Notes (Addendum)
Inpatient Diabetes Program Recommendations  AACE/ADA: New Consensus Statement on Inpatient Glycemic Control (2013)  Target Ranges:  Prepandial:   less than 140 mg/dL      Peak postprandial:   less than 180 mg/dL (1-2 hours)      Critically ill patients:  140 - 180 mg/dL   Reason for Visit: Results for [name], [name] (MRN [id number]) as of 09/16/2012 13:58  Ref. Range 09/15/2012 11:33 09/15/2012 17:22 09/15/2012 22:32 09/16/2012 08:22 09/16/2012 12:25  Glucose-Capillary Latest Range: 70-99 mg/dL 93 217 (H) 229 (H) 135 (H) 80   Note patient's CBG was low on admission.  Patient was on Lantus 70 units daily prior to admission.  Patient has not been on Lantus since admission and CBG's look good today.  May need adjustment in home Lantus dose prior to discharge to prevent hypoglycemia.  Consider decreasing discharge dose of Lantus (to1/2 of home dose or less). Will need close follow-up with PCP after discharge. Will follow.   Addendum:  Briefly spoke to patient.  She confirmed that she often has low CBG's around 3 or 4 in the morning.  Discussed with patient that she may need decrease in Lantus dose.  Also discussed proper treatment of hypoglycemia.  Patient verbalized understanding.

## 2012-09-16 NOTE — Progress Notes (Signed)
Pt has gone down for a TEE and cardioversion. RN from [employer] here to follow patient done. Husband going down with the patient.

## 2012-09-16 NOTE — Preoperative (Signed)
Beta Blockers   Reason not to administer Beta Blockers:Not Applicable 

## 2012-09-16 NOTE — Anesthesia Postprocedure Evaluation (Signed)
  Anesthesia Post-op Note  Patient: [name]  Procedure(s) Performed: Procedure(s) (LRB) with comments: TRANSESOPHAGEAL ECHOCARDIOGRAM (TEE) (N/A) CARDIOVERSION (N/A)  Patient Location: PACU  Anesthesia Type:MAC  Level of Consciousness: awake, alert  and oriented  Airway and Oxygen Therapy: Patient Spontanous Breathing and Patient connected to nasal cannula oxygen  Post-op Pain: none  Post-op Assessment: Post-op Vital signs reviewed, Patient's Cardiovascular Status Stable, Respiratory Function Stable, Patent Airway, No signs of Nausea or vomiting and Pain level controlled  Post-op Vital Signs: Reviewed and stable  Complications: No apparent anesthesia complications

## 2012-09-16 NOTE — CV Procedure (Signed)
Dana, [name], 66 y.o., 09/07/1946  Location: [facility]OSCOPY  Bed: NONE  MRN: [id number]  CSN: [id number]  Admit Dt: 09/14/12  INDICATIONS: atrial fibrillation precardioversion  PROCEDURE:   Informed consent was obtained prior to the procedure. The risks, benefits and alternatives for the procedure were discussed and the patient comprehended these risks.  Risks include, but are not limited to, cough, sore throat, vomiting, nausea, somnolence, esophageal and stomach trauma or perforation, bleeding, low blood pressure, aspiration, pneumonia, infection, trauma to the teeth and death.    After a procedural time-out, the oropharynx was anesthetized with 20% benzocaine spray. The patient was given 4 mg versed and 50 mcg fentanyl for moderate sedation.   The transesophageal probe was inserted in the esophagus and stomach without difficulty and multiple views were obtained.  The patient was kept under observation until the patient left the procedure room.  The patient left the procedure room in stable condition.   Agitated microbubble saline contrast was not administered.  COMPLICATIONS:    There were no immediate complications.  FINDINGS:  Mildly dilated left atrium. No LA clot. Normal left atrial appendage emptying velocities. Normal left ventricular systolic function. Moderate LVH. Moderate aortic insufficiency. Moderate aortic atherosclerosis.  RECOMMENDATIONS:   Proceed with cardioversion  Time Spent Directly with the Patient:  30 minutes   [name] 09/16/2012, 1:00 PM

## 2012-09-16 NOTE — Progress Notes (Signed)
[facility]  DAILY PROGRESS NOTE   Subjective:  Still dyspneic at rest. Ventricular rate was 100-150 most of the night, but now consistently 75-90. Echo confirms clinical suspicion of pulmonary HTN (probably secondary to unrecognized/untreated OSA).  Objective:  Temp:  [97.7 F (36.5 C)-98.4 F (36.9 C)] 97.7 F (36.5 C) (12/19 0815) Pulse Rate:  [25-143] 85  (12/19 0600) Resp:  [14-28] 22  (12/19 0600) BP: (66-150)/(29-99) 127/77 mmHg (12/19 0600) SpO2:  [45 %-100 %] 100 % (12/19 0736) Weight:  [120.1 kg (264 lb 12.4 oz)-120.5 kg (265 lb 10.5 oz)] 120.5 kg (265 lb 10.5 oz) (12/19 0500) Weight change: 6.701 kg (14 lb 12.4 oz)  Intake/Output from previous day: 12/18 0701 - 12/19 0700 In: 1457.3 [P.O.:240; I.V.:1217.3] Out: 1250 [Urine:1250]  Intake/Output from this shift:    Medications: Current Facility-Administered Medications  Medication Dose Route Frequency Provider Last Rate Last Dose  . 0.9 % NaCl with KCl 20 mEq/ L  infusion   Intravenous Continuous Delfina Redwood, MD 20 mL/hr at 09/16/12 0800    . acetaminophen (TYLENOL) tablet 650 mg  650 mg Oral Q6H PRN Delfina Redwood, MD      . aspirin EC tablet 81 mg  81 mg Oral Daily Karlyn Agee, MD   81 mg at 09/15/12 1005  . diltiazem (CARDIZEM) 100 mg in dextrose 5 % 100 mL infusion  5-15 mg/hr Intravenous Titrated Karlyn Agee, MD 10 mL/hr at 09/16/12 0800 10 mg/hr at 09/16/12 0800  . feeding supplement (GLUCERNA SHAKE) liquid 237 mL  237 mL Oral BID BM Frederik Schmidt, RD   237 mL at 09/15/12 1400  . heparin ADULT infusion 100 units/mL (25000 units/250 mL)  14 Units/kg/hr Intravenous Continuous Renella Cunas, MD 16 mL/hr at 09/16/12 0800 1,600 Units at 09/16/12 0800  . hydroxychloroquine (PLAQUENIL) tablet 200 mg  200 mg Oral BID Karlyn Agee, MD   200 mg at 09/15/12 2256  . insulin aspart (novoLOG) injection 0-15 Units  0-15 Units Subcutaneous TID WC Karlyn Agee, MD    2 Units at 09/16/12 [phone]  . insulin aspart (novoLOG) injection 0-5 Units  0-5 Units Subcutaneous QHS Karlyn Agee, MD   2 Units at 09/15/12 2255  . ipratropium (ATROVENT) nebulizer solution 0.5 mg  0.5 mg Nebulization TID Delfina Redwood, MD   0.5 mg at 09/16/12 0736  . levalbuterol (XOPENEX) nebulizer solution 0.63 mg  0.63 mg Nebulization TID Delfina Redwood, MD   0.63 mg at 09/16/12 0736  . ondansetron (ZOFRAN) injection 4 mg  4 mg Intravenous Q4H PRN Karlyn Agee, MD      . predniSONE (DELTASONE) tablet 5 mg  5 mg Oral Q breakfast Delfina Redwood, MD   5 mg at 09/16/12 0824  . Rivaroxaban (XARELTO) tablet TABS 15 mg  15 mg Oral Daily Yohance Hathorne, MD      . sorbitol 70 % solution 30 mL  30 mL Oral Daily PRN Karlyn Agee, MD   30 mL at 09/15/12 1005    PHYSICAL EXAM:  General appearance: alert, cooperative and mild distress ; morbidly obese Neck: no adenopathy, no carotid bruit, supple, symmetrical, trachea midline, thyroid not enlarged, symmetric, no tenderness/mass/nodules and Jugular veins cannot be examined due to obesity; prominent acanthosis  Lungs: Scattered wheezes bilaterally  Heart: Irregular rhythm normal heart sounds and no rubs murmurs or gallops  Abdomen: Obesity precludes examination but no major abnormalities are identified  Extremities: 1+ symmetrical pretibial edema  Pulses:  2+ symmetrical radial ulnar and dorsalis pedis pulses  Skin: Acantosis  Neurologic: Strabismus but no other gross focal neurological abnormalities  Lab Results: Results for orders placed during the hospital encounter of 09/14/12 (from the past 48 hour(s))  CBC     Status: Abnormal   Collection Time   09/14/12 11:14 AM      Component Value Range Comment   WBC 7.1  4.0 - 10.5 K/uL    RBC 2.85 (*) 3.87 - 5.11 MIL/uL    Hemoglobin 8.0 (*) 12.0 - 15.0 g/dL    HCT 24.2 (*) 36.0 - 46.0 %    MCV 84.9  78.0 - 100.0 fL    MCH 28.1  26.0 - 34.0 pg    MCHC 33.1  30.0 - 36.0 g/dL     RDW 14.9  11.5 - 15.5 %    Platelets 395  150 - 400 K/uL   BASIC METABOLIC PANEL     Status: Abnormal   Collection Time   09/14/12 11:14 AM      Component Value Range Comment   Sodium 129 (*) 135 - 145 mEq/L    Potassium 4.7  3.5 - 5.1 mEq/L    Chloride 93 (*) 96 - 112 mEq/L    CO2 27  19 - 32 mEq/L    Glucose, Bld 57 (*) 70 - 99 mg/dL    BUN 23  6 - 23 mg/dL    Creatinine, Ser 1.26 (*) 0.50 - 1.10 mg/dL    Calcium 8.6  8.4 - 10.5 mg/dL    GFR calc non Af Amer 43 (*) >90 mL/min    GFR calc Af Amer 50 (*) >90 mL/min   TROPONIN I     Status: Normal   Collection Time   09/14/12 11:14 AM      Component Value Range Comment   Troponin I <0.30  <0.30 ng/mL   GLUCOSE, CAPILLARY     Status: Abnormal   Collection Time   09/14/12  1:45 PM      Component Value Range Comment   Glucose-Capillary <10 (*) 70 - 99 mg/dL REPEATED TO VERIFY, CHARGE CREDITED  GLUCOSE, CAPILLARY     Status: Abnormal   Collection Time   09/14/12  1:49 PM      Component Value Range Comment   Glucose-Capillary <10 (*) 70 - 99 mg/dL   GLUCOSE, CAPILLARY     Status: Normal   Collection Time   09/14/12  2:04 PM      Component Value Range Comment   Glucose-Capillary 70  70 - 99 mg/dL   GLUCOSE, CAPILLARY     Status: Abnormal   Collection Time   09/14/12  4:18 PM      Component Value Range Comment   Glucose-Capillary 107 (*) 70 - 99 mg/dL   GLUCOSE, CAPILLARY     Status: Abnormal   Collection Time   09/14/12  7:21 PM      Component Value Range Comment   Glucose-Capillary 61 (*) 70 - 99 mg/dL   TROPONIN I     Status: Normal   Collection Time   09/14/12  8:25 PM      Component Value Range Comment   Troponin I <0.30  <0.30 ng/mL   GLUCOSE, CAPILLARY     Status: Abnormal   Collection Time   09/14/12  9:50 PM      Component Value Range Comment   Glucose-Capillary 124 (*) 70 - 99 mg/dL   TSH  Status: Normal   Collection Time   09/14/12 10:06 PM      Component Value Range Comment   TSH 1.332  0.350 -  4.500 uIU/mL   HEMOGLOBIN A1C     Status: Abnormal   Collection Time   09/14/12 10:06 PM      Component Value Range Comment   Hemoglobin A1C 6.1 (*) <5.7 %    Mean Plasma Glucose 128 (*) <117 mg/dL   PRO B NATRIURETIC PEPTIDE     Status: Abnormal   Collection Time   09/14/12 10:06 PM      Component Value Range Comment   Pro B Natriuretic peptide (BNP) 4230.0 (*) 0 - 125 pg/mL   HEPATIC FUNCTION PANEL     Status: Abnormal   Collection Time   09/14/12 10:07 PM      Component Value Range Comment   Total Protein 7.5  6.0 - 8.3 g/dL    Albumin 2.2 (*) 3.5 - 5.2 g/dL    AST 16  0 - 37 U/L    ALT 18  0 - 35 U/L    Alkaline Phosphatase 79  39 - 117 U/L    Total Bilirubin 0.2 (*) 0.3 - 1.2 mg/dL    Bilirubin, Direct 0.1  0.0 - 0.3 mg/dL    Indirect Bilirubin 0.1 (*) 0.3 - 0.9 mg/dL   BLOOD GAS, ARTERIAL     Status: Abnormal   Collection Time   09/14/12 10:17 PM      Component Value Range Comment   O2 Content 2.0      Delivery systems NASAL CANNULA      pH, Arterial 7.431  7.350 - 7.450    pCO2 arterial 36.2  35.0 - 45.0 mmHg    pO2, Arterial 129.0 (*) 80.0 - 100.0 mmHg    Bicarbonate 23.7  20.0 - 24.0 mEq/L    TCO2 21.2  0 - 100 mmol/L    Acid-Base Excess 0.1  0.0 - 2.0 mmol/L    O2 Saturation 99.2      Collection site RIGHT RADIAL      Drawn by [id number]      Sample type ARTERIAL      Allens test (pass/fail) PASS  PASS   D-DIMER, QUANTITATIVE     Status: Abnormal   Collection Time   09/14/12 10:37 PM      Component Value Range Comment   D-Dimer, Quant 10.72 (*) 0.00 - 0.48 ug/mL-FEU   MAGNESIUM     Status: Normal   Collection Time   09/15/12 12:00 AM      Component Value Range Comment   Magnesium 2.4  1.5 - 2.5 mg/dL   MRSA PCR SCREENING     Status: Normal   Collection Time   09/15/12 12:55 AM      Component Value Range Comment   MRSA by PCR NEGATIVE  NEGATIVE   GLUCOSE, CAPILLARY     Status: Abnormal   Collection Time   09/15/12  1:08 AM      Component Value Range  Comment   Glucose-Capillary 104 (*) 70 - 99 mg/dL    Comment 1 Notify RN     URINALYSIS, ROUTINE W REFLEX MICROSCOPIC     Status: Abnormal   Collection Time   09/15/12  1:32 AM      Component Value Range Comment   Color, Urine YELLOW  YELLOW    APPearance HAZY (*) CLEAR    Specific Gravity, Urine >1.030 (*) 1.005 - 1.030  pH 5.5  5.0 - 8.0    Glucose, UA NEGATIVE  NEGATIVE mg/dL    Hgb urine dipstick NEGATIVE  NEGATIVE    Bilirubin Urine NEGATIVE  NEGATIVE    Ketones, ur NEGATIVE  NEGATIVE mg/dL    Protein, ur 30 (*) NEGATIVE mg/dL    Urobilinogen, UA 0.2  0.0 - 1.0 mg/dL    Nitrite NEGATIVE  NEGATIVE    Leukocytes, UA SMALL (*) NEGATIVE   URINE MICROSCOPIC-ADD ON     Status: Abnormal   Collection Time   09/15/12  1:32 AM      Component Value Range Comment   Squamous Epithelial / LPF MANY (*) RARE    WBC, UA 0-2  <3 WBC/hpf    RBC / HPF 3-6  <3 RBC/hpf    Bacteria, UA MANY (*) RARE    Casts HYALINE CASTS (*) NEGATIVE    Urine-Other TRICHOMONAS PRESENT     HEPARIN LEVEL (UNFRACTIONATED)     Status: Normal   Collection Time   09/15/12  5:08 AM      Component Value Range Comment   Heparin Unfractionated 0.48  0.30 - 0.70 IU/mL   BASIC METABOLIC PANEL     Status: Abnormal   Collection Time   09/15/12  5:08 AM      Component Value Range Comment   Sodium 128 (*) 135 - 145 mEq/L    Potassium 5.5 (*) 3.5 - 5.1 mEq/L    Chloride 94 (*) 96 - 112 mEq/L    CO2 25  19 - 32 mEq/L    Glucose, Bld 90  70 - 99 mg/dL    BUN 24 (*) 6 - 23 mg/dL    Creatinine, Ser 1.54 (*) 0.50 - 1.10 mg/dL    Calcium 8.1 (*) 8.4 - 10.5 mg/dL    GFR calc non Af Amer 34 (*) >90 mL/min    GFR calc Af Amer 39 (*) >90 mL/min   CBC     Status: Abnormal   Collection Time   09/15/12  5:08 AM      Component Value Range Comment   WBC 9.3  4.0 - 10.5 K/uL    RBC 2.71 (*) 3.87 - 5.11 MIL/uL    Hemoglobin 7.5 (*) 12.0 - 15.0 g/dL    HCT 23.2 (*) 36.0 - 46.0 %    MCV 85.6  78.0 - 100.0 fL    MCH 27.7  26.0  - 34.0 pg    MCHC 32.3  30.0 - 36.0 g/dL    RDW 15.1  11.5 - 15.5 %    Platelets 391  150 - 400 K/uL   GLUCOSE, CAPILLARY     Status: Normal   Collection Time   09/15/12  7:41 AM      Component Value Range Comment   Glucose-Capillary 92  70 - 99 mg/dL   VITAMIN B12     Status: Abnormal   Collection Time   09/15/12  9:37 AM      Component Value Range Comment   Vitamin B-12 1268 (*) 211 - 911 pg/mL   FOLATE     Status: Normal   Collection Time   09/15/12  9:37 AM      Component Value Range Comment   Folate >20.0     IRON AND TIBC     Status: Abnormal   Collection Time   09/15/12  9:37 AM      Component Value Range Comment   Iron 17 (*) 42 - 135 ug/dL  TIBC 236 (*) 250 - 470 ug/dL    Saturation Ratios 7 (*) 20 - 55 %    UIBC 219  125 - 400 ug/dL   FERRITIN     Status: Normal   Collection Time   09/15/12  9:37 AM      Component Value Range Comment   Ferritin 247  10 - 291 ng/mL   GLUCOSE, CAPILLARY     Status: Normal   Collection Time   09/15/12 11:33 AM      Component Value Range Comment   Glucose-Capillary 93  70 - 99 mg/dL   GLUCOSE, CAPILLARY     Status: Abnormal   Collection Time   09/15/12  5:22 PM      Component Value Range Comment   Glucose-Capillary 217 (*) 70 - 99 mg/dL    Comment 1 Documented in Chart      Comment 2 Notify RN     BASIC METABOLIC PANEL     Status: Abnormal   Collection Time   09/15/12  9:01 PM      Component Value Range Comment   Sodium 128 (*) 135 - 145 mEq/L    Potassium 5.3 (*) 3.5 - 5.1 mEq/L    Chloride 94 (*) 96 - 112 mEq/L    CO2 19  19 - 32 mEq/L    Glucose, Bld 216 (*) 70 - 99 mg/dL    BUN 28 (*) 6 - 23 mg/dL    Creatinine, Ser 1.49 (*) 0.50 - 1.10 mg/dL    Calcium 8.5  8.4 - 10.5 mg/dL    GFR calc non Af Amer 35 (*) >90 mL/min    GFR calc Af Amer 41 (*) >90 mL/min   MAGNESIUM     Status: Normal   Collection Time   09/15/12  9:01 PM      Component Value Range Comment   Magnesium 2.3  1.5 - 2.5 mg/dL   TROPONIN I      Status: Normal   Collection Time   09/15/12  9:01 PM      Component Value Range Comment   Troponin I <0.30  <0.30 ng/mL   CK TOTAL AND CKMB     Status: Normal   Collection Time   09/15/12  9:01 PM      Component Value Range Comment   Total CK 98  7 - 177 U/L    CK, MB 2.6  0.3 - 4.0 ng/mL    Relative Index RELATIVE INDEX IS INVALID  0.0 - 2.5   HEPARIN LEVEL (UNFRACTIONATED)     Status: Normal   Collection Time   09/15/12 10:26 PM      Component Value Range Comment   Heparin Unfractionated 0.48  0.30 - 0.70 IU/mL   GLUCOSE, CAPILLARY     Status: Abnormal   Collection Time   09/15/12 10:32 PM      Component Value Range Comment   Glucose-Capillary 229 (*) 70 - 99 mg/dL    Comment 1 Notify RN      Comment 2 Documented in Chart     PRO B NATRIURETIC PEPTIDE     Status: Abnormal   Collection Time   09/16/12  5:10 AM      Component Value Range Comment   Pro B Natriuretic peptide (BNP) 6895.0 (*) 0 - 125 pg/mL   GLUCOSE, CAPILLARY     Status: Abnormal   Collection Time   09/16/12  8:22 AM      Component Value Range  Comment   Glucose-Capillary 135 (*) 70 - 99 mg/dL     Imaging: Ct Angio Chest Pe W/cm &/or Wo Cm  09/15/2012  *RADIOLOGY REPORT*  Clinical Data: New onset atrial flutter.  CT ANGIOGRAPHY CHEST  Technique:  Multidetector CT imaging of the chest using the standard protocol during bolus administration of intravenous contrast. Multiplanar reconstructed images including MIPs were obtained and reviewed to evaluate the vascular anatomy.  Contrast: 190mL OMNIPAQUE IOHEXOL 350 MG/ML SOLN  Comparison: 09/14/2012 radiograph, 01/11/2004 CT  Findings: Pulmonary arterial branches are patent.  Cardiomegaly. Pericardial effusion measures water attenuation.  There are bilateral pleural effusions, small however with mild loculation.  Limited images through the upper abdomen show no acute finding.  Lymphadenopathy.  Right subcarinal node measuring 1.9 cm short axis and right superior  mediastinal para esophageal lymph node measuring 1.7 cm short axis.  Scattered atherosclerosis of the aorta and branch vessels.  Small hiatal hernia.  Central airways are patent.  Mild bullous changes and peripheral reticular opacities.  Mild bibasilar opacities associated with the pleural fluid.  Nonspecific and lingular consolidation. No pneumothorax.  No acute osseous finding.  IMPRESSION: No pulmonary embolism.  Cardiomegaly with small pericardial effusion and bilateral pleural effusions.  Mediastinal lymphadenopathy.  May be reactive in the setting of lupus.  Lymphoma or malignancy not excluded.  Lymph nodes have enlarged from 2005.  Consider tissue sampling.  Mild background emphysema and interstitial lung disease.   Original Report Authenticated By: Carlos Levering, M.D.    Dg Chest Port 1 View  09/14/2012  *RADIOLOGY REPORT*  Clinical Data: Chest pain and tachycardia.  PORTABLE CHEST - 1 VIEW  Comparison: PA and lateral chest 09/04/2012.  Findings: Marked enlargement cardiopericardial silhouette is again seen.  There is vascular congestion without frank edema.  No focal airspace disease, pleural effusion or pneumothorax.  IMPRESSION: No change in marked enlargement of the cardiopericardial silhouette and pulmonary vascular congestion.   Original Report Authenticated By: Orlean Patten, M.D.     Assessment:  1. Principal Problem: 2.  *Atrial flutter with rapid ventricular response 3. Active Problems: 4.  Morbid obesity 5.  Diabetes mellitus type 2 in obese 6.  Neuropathy in diabetes 7.  SLE (systemic lupus erythematosus) 8.  Hypoglycemia associated with diabetes 9.  HTN (hypertension) 10.  COPD (chronic obstructive pulmonary disease) 11.  Hyperkalemia 12.  Anemia 13.  Mediastinal lymphadenopathy 14.  Hyponatremia 15.  Acute CHF 16.  Chest pain 17.  Acute renal insufficiency 18.   Plan:  1. TEE and DC cardioversion with sedation discussed in detail - she agrees to proceed. Will  need a minimum of 30 days of uninterrupted anticoagulation.  2. Renal function and K level seem to have stabilized - suspect there is a component of acute contrast nephrotoxicity after CTAngio; baseline creat was normal at around 0.9 early this month 3. Discussed warfarin vs. Newer anticoagulants - we decided on Xarelto. With current Creat Cl dose at 15 mg/d, but chronic dose should be 20 mg/day. 4. Will need outpatient sleep study 5. Fe studies confirm iron deficiency as cause of chronic moderate anemia - will also need a GI evaluation, but there does not appear to be active bleeding. No bleeding while on IV heparin. Start Fe supplements. 6. If Atrial flutter recurs early, she is best suited for early referral for RF ablation. This may also be the best option if overt GI bleeding occurs. 7. Also consider a diagnosis of sarcoidosis - not sure how well supported the  SLE diagnosis was. Either could explain mediastinal adenopathy, arthralgias and rashes. Time Spent Directly with Patient:  45 minutes  Length of Stay:  LOS: 2 days    Fallyn Munnerlyn 09/16/2012, 9:04 AM

## 2012-09-16 NOTE — Op Note (Signed)
Hadleigh, Schwake Female, 66 y.o., [birth year]  Location: [facility]  Bed: NONE  MRN: [id number]  CSN: [id number]  Admit Dt: 09/14/12  Procedure: Electrical Cardioversion Indications:  Atrial Flutter  Procedure Details:  Consent: Risks of procedure as well as the alternatives and risks of each were explained to the (patient/caregiver).  Consent for procedure obtained.  Time Out: Verified patient identification, verified procedure, site/side was marked, verified correct patient position, special equipment/implants available, medications/allergies/relevent history reviewed, required imaging and test results available.  Performed  Patient placed on cardiac monitor, pulse oximetry, supplemental oxygen as necessary.  Sedation given: Propofol 70 mg IV, Dr. Vernie Ammons Pacer pads placed anterior and posterior chest.  Cardioverted 1 time(s).  Cardioverted at 120J. Synchronized biphasic  Evaluation: Findings: Post procedure EKG shows: NSR Complications: None Patient did tolerate procedure well.  Time Spent Directly with the Patient:  30 minutes   Ricca Melgarejo 09/16/2012, 1:01 PM

## 2012-09-16 NOTE — Progress Notes (Signed)
Pt returned from TEE, alert, ox3, VSS, denies pain or discomfort, only requests for food.  Family at bedside, new orders carried out

## 2012-09-16 NOTE — Transfer of Care (Signed)
Immediate Anesthesia Transfer of Care Note  Patient: [name]  Procedure(s) Performed: Procedure(s) (LRB) with comments: TRANSESOPHAGEAL ECHOCARDIOGRAM (TEE) (N/A) CARDIOVERSION (N/A)  Patient Location: PACU  Anesthesia Type:MAC  Level of Consciousness: awake, alert  and oriented  Airway & Oxygen Therapy: Patient Spontanous Breathing and Patient connected to nasal cannula oxygen  Post-op Assessment: Report given to PACU RN  Post vital signs: Reviewed and stable  Complications: No apparent anesthesia complications

## 2012-09-16 NOTE — Anesthesia Preprocedure Evaluation (Addendum)
Anesthesia Evaluation  Patient identified by MRN, date of birth, ID band Patient awake    Reviewed: Allergy & Precautions, H&P , NPO status , Patient's Chart, lab work & pertinent test results  Airway Mallampati: II TM Distance: >3 FB Neck ROM: Full    Dental  (+) Edentulous Upper and Edentulous Lower   Pulmonary asthma , COPD         Cardiovascular hypertension, +CHF + dysrhythmias Atrial Fibrillation Rhythm:Irregular     Neuro/Psych    GI/Hepatic   Endo/Other  diabetes, Type 2, Insulin Dependent  Renal/GU Renal InsufficiencyRenal disease     Musculoskeletal   Abdominal   Peds  Hematology   Anesthesia Other Findings   Reproductive/Obstetrics                          Anesthesia Physical Anesthesia Plan  ASA: III  Anesthesia Plan: MAC   Post-op Pain Management:    Induction: Intravenous  Airway Management Planned: Mask  Additional Equipment:   Intra-op Plan:   Post-operative Plan:   Informed Consent: I have reviewed the patients History and Physical, chart, labs and discussed the procedure including the risks, benefits and alternatives for the proposed anesthesia with the patient or authorized representative who has indicated his/her understanding and acceptance.   Dental advisory given  Plan Discussed with: CRNA, Anesthesiologist and Surgeon  Anesthesia Plan Comments:        Anesthesia Quick Evaluation

## 2012-09-17 ENCOUNTER — Encounter (HOSPITAL_COMMUNITY): Payer: Self-pay | Admitting: Cardiovascular Disease

## 2012-09-17 DIAGNOSIS — Z7901 Long term (current) use of anticoagulants: Secondary | ICD-10-CM

## 2012-09-17 LAB — BASIC METABOLIC PANEL WITH GFR
BUN: 29 mg/dL — ABNORMAL HIGH (ref 6–23)
CO2: 23 meq/L (ref 19–32)
Calcium: 8.8 mg/dL (ref 8.4–10.5)
Chloride: 98 meq/L (ref 96–112)
Creatinine, Ser: 1.56 mg/dL — ABNORMAL HIGH (ref 0.50–1.10)
GFR calc Af Amer: 39 mL/min — ABNORMAL LOW
GFR calc non Af Amer: 34 mL/min — ABNORMAL LOW
Glucose, Bld: 156 mg/dL — ABNORMAL HIGH (ref 70–99)
Potassium: 4.7 meq/L (ref 3.5–5.1)
Sodium: 134 meq/L — ABNORMAL LOW (ref 135–145)

## 2012-09-17 LAB — HEMOGLOBIN AND HEMATOCRIT, BLOOD
HCT: 23 % — ABNORMAL LOW (ref 36.0–46.0)
Hemoglobin: 7.2 g/dL — ABNORMAL LOW (ref 12.0–15.0)

## 2012-09-17 LAB — GLUCOSE, CAPILLARY
Glucose-Capillary: 161 mg/dL — ABNORMAL HIGH (ref 70–99)
Glucose-Capillary: 150 mg/dL — ABNORMAL HIGH (ref 70–99)
Glucose-Capillary: 137 mg/dL — ABNORMAL HIGH (ref 70–99)
Glucose-Capillary: 227 mg/dL — ABNORMAL HIGH (ref 70–99)
Glucose-Capillary: 138 mg/dL — ABNORMAL HIGH (ref 70–99)

## 2012-09-17 LAB — OCCULT BLOOD X 1 CARD TO LAB, STOOL: Fecal Occult Bld: NEGATIVE

## 2012-09-17 LAB — ABO/RH: ABO/RH(D): A POS

## 2012-09-17 MED ORDER — FUROSEMIDE 10 MG/ML IJ SOLN
20.0000 mg | Freq: Once | INTRAMUSCULAR | Status: AC
  Administered 2012-09-17: 20 mg via INTRAVENOUS

## 2012-09-17 MED ORDER — METOPROLOL TARTRATE 25 MG PO TABS
25.0000 mg | ORAL_TABLET | Freq: Two times a day (BID) | ORAL | Status: DC
  Administered 2012-09-17 – 2012-09-18 (×2): 25 mg via ORAL
  Filled 2012-09-17 (×3): qty 1

## 2012-09-17 MED ORDER — POLYETHYLENE GLYCOL 3350 17 G PO PACK
17.0000 g | PACK | Freq: Every day | ORAL | Status: DC
  Administered 2012-09-17 – 2012-09-18 (×2): 17 g via ORAL
  Filled 2012-09-17 (×3): qty 1

## 2012-09-17 MED ORDER — FUROSEMIDE 10 MG/ML IJ SOLN
INTRAMUSCULAR | Status: AC
  Filled 2012-09-17: qty 4

## 2012-09-17 MED ORDER — DOCUSATE SODIUM 100 MG PO CAPS
200.0000 mg | ORAL_CAPSULE | Freq: Every day | ORAL | Status: DC
  Administered 2012-09-17 – 2012-09-20 (×4): 200 mg via ORAL
  Filled 2012-09-17 (×3): qty 2
  Filled 2012-09-17: qty 1

## 2012-09-17 NOTE — Consult Note (Signed)
Subjective:   HPI  The patient is a 66 year old female with multiple medical problems who recently went to the emergency room with some chest pain and shortness of breath. She was found to be in atrial flutter with a rapid heart rate and was transferred here to the hospital where she was subsequently admitted. She underwent a TEE and cardioversion. She was started on Xeralto.  We are asked to see her in regards to anemia. On December 7 her hemoglobin and hematocrit were 9.3 and 28.2. On December 17 her hemoglobin and hematocrit was 8 and 24.2. On December 18 her hemoglobin and hematocrit were 7.5 and 23. Today her hemoglobin and hematocrit are 7.2 and 23. She denies passing blood in the stool, and denies melena. She has had a hysterectomy in the past and does not have menstrual bleeding.  Stool was checked today and is Hemoccult negative.  Recent iron studies do suggest iron deficiency.  Her last colonoscopy was in 2005.  She is currently eating regular food, and she is still somewhat short of breath. She is going to be receiving a unit of packed red cells this evening.  Review of Systems Negative except for above  Past Medical History  Diagnosis Date  . Asthma   . Hypertension   . Diabetes mellitus without complication   . SLE (systemic lupus erythematosus)   . Shortness of breath   . Anemia   . Dysrhythmia   . Chronic anticoagulation, due to a fib, on xarelto 09/16/12 09/17/2012  . CKD (chronic kidney disease) stage 3, GFR 30-59 ml/min 09/17/2012   Past Surgical History  Procedure Date  . Ankle surgery 1993  . Back surgery 1980  . Abdominal hysterectomy 1983  . Tee without cardioversion 09/16/2012    Procedure: TRANSESOPHAGEAL ECHOCARDIOGRAM (TEE);  Surgeon: Sanda Klein, MD;  Location: [facility];  Service: Cardiovascular;  Laterality: N/A;  . Cardioversion 09/16/2012    Procedure: CARDIOVERSION;  Surgeon: Sanda Klein, MD;  Location: [facility];  Service:  Cardiovascular;  Laterality: N/A;   History   Social History  . Marital Status: Married    Spouse Name: N/A    Number of Children: N/A  . Years of Education: N/A   Occupational History  . Not on file.   Social History Main Topics  . Smoking status: Light Tobacco Smoker -- 0.1 packs/day    Types: Cigarettes  . Smokeless tobacco: Not on file  . Alcohol Use: No  . Drug Use: No  . Sexually Active: Yes   Other Topics Concern  . Not on file   Social History Narrative  . No narrative on file   family history includes Diabetes in her brother, father, and sister; Hypertension in her brother, father, and sister; and Stroke in her mother. Current facility-administered medications:acetaminophen (TYLENOL) tablet 650 mg, 650 mg, Oral, Q6H PRN, Delfina Redwood, MD;  ALPRAZolam Duanne Moron) tablet 0.25 mg, 0.25 mg, Oral, TID PRN, Erlene Quan, PA;  docusate sodium (COLACE) capsule 200 mg, 200 mg, Oral, Daily, Cecilie Kicks, NP, 200 mg at 09/17/12 1325;  feeding supplement (GLUCERNA SHAKE) liquid 237 mL, 237 mL, Oral, BID BM, Frederik Schmidt, RD, 237 mL at 09/17/12 1044 ferrous sulfate tablet 325 mg, 325 mg, Oral, TID WC, Mihai Croitoru, MD, 325 mg at 09/17/12 1040;  furosemide (LASIX) injection 20 mg, 20 mg, Intravenous, Once, Cecilie Kicks, NP;  furosemide (LASIX) tablet 40 mg, 40 mg, Oral, Daily, Mihai Croitoru, MD, 40 mg at 09/17/12 1040;  hydroxychloroquine (PLAQUENIL) tablet  200 mg, 200 mg, Oral, BID, Karlyn Agee, MD, 200 mg at 09/17/12 1040 insulin aspart (novoLOG) injection 0-15 Units, 0-15 Units, Subcutaneous, TID WC, Karlyn Agee, MD, 2 Units at 09/17/12 1326;  insulin aspart (novoLOG) injection 0-5 Units, 0-5 Units, Subcutaneous, QHS, Karlyn Agee, MD, 2 Units at 09/15/12 2255;  ipratropium (ATROVENT) nebulizer solution 0.5 mg, 0.5 mg, Nebulization, TID, Delfina Redwood, MD, 0.5 mg at 09/17/12 1406 levalbuterol (XOPENEX) nebulizer solution 0.63 mg, 0.63 mg, Nebulization, TID,  Delfina Redwood, MD, 0.63 mg at 09/17/12 1406;  ondansetron (ZOFRAN) injection 4 mg, 4 mg, Intravenous, Q4H PRN, Karlyn Agee, MD;  polyethylene glycol (MIRALAX / GLYCOLAX) packet 17 g, 17 g, Oral, Daily, Cecilie Kicks, NP, 17 g at 09/17/12 1325;  predniSONE (DELTASONE) tablet 5 mg, 5 mg, Oral, Q breakfast, Delfina Redwood, MD, 5 mg at 09/17/12 [id number] Rivaroxaban (XARELTO) tablet TABS 15 mg, 15 mg, Oral, Q supper, Sanda Klein, MD, 15 mg at 09/16/12 1136;  sorbitol 70 % solution 30 mL, 30 mL, Oral, Daily PRN, Karlyn Agee, MD, 30 mL at 09/15/12 1005;  traMADol (ULTRAM) tablet 50 mg, 50 mg, Oral, Q6H PRN, Erlene Quan, PA;  zolpidem (AMBIEN) tablet 5 mg, 5 mg, Oral, QHS PRN, Erlene Quan, PA No Known Allergies   Objective:     BP 133/70  Pulse 94  Temp 98.1 F (36.7 C) (Oral)  Resp 19  Ht 5\' 5"  (1.651 m)  Wt 117.4 kg (258 lb 13.1 oz)  BMI 43.07 kg/m2  SpO2 97%  She is in no acute distress. She seems slightly short of breath when she talks.  Heart regular rhythm  Lungs clear  Abdomen: Bowel sounds normal, soft, nontender  Laboratory No components found with this basename: d1      Assessment:     Iron deficiency anemia  Heme-negative stool  No obvious GI bleeding.  Patient recently started on [place]:     Agree with giving blood transfusion at this time. I would recommend rechecking another stool for occult blood since the first one was negative. We could consider evaluation of her GI tract once her cardiac status is stable to look for a source of iron deficiency anemia. This could include EGD, and colonoscopy. If her cardiac status however is of concern to undergo these procedures we could proceed with barium enema, and upper GI series.  Lab Results  Component Value Date   HGB 7.2* 09/17/2012   HGB 7.5* 09/16/2012   HGB 7.5* 09/15/2012   HCT 23.0* 09/17/2012   HCT 23.3* 09/16/2012   HCT 23.2* 09/15/2012   ALKPHOS 79 09/14/2012   AST 16  09/14/2012   ALT 18 09/14/2012

## 2012-09-17 NOTE — Progress Notes (Signed)
CARDIAC REHAB PHASE I   PRE:  Rate/Rhythm: 96 SR  BP:  Supine: 146/52  Sitting:   Standing:    SaO2: 93 RA  MODE:  Ambulation: 100 ft   POST:  Rate/Rhythem: 125 ST  BP:  Supine:   Sitting: 166/72  Standing:    SaO2: 90-92 RA 1445-1535 Assisted X 1 to ambulate. Pt's gait steady, slow pace. She tires easily and is very DOE. Pt took one standing rest stop due to SOB. She was exhausted by end of 100 feet. RA sat during walk 90-92%, HR after walk 125 ST. It is hard to get a information from pt about how active she was at home before admission. She does keep three children ages 58, 61 and 66. It took approximately 5-7 minutes for her to recovery from walk and get her breath back. She seems very deconditioned. Pt in recliner after walk with call light in reach. Stated CHF education with pt and gave her CHF handout. Ask RN to get CHF video for her to view.    Deon Pilling

## 2012-09-17 NOTE — Progress Notes (Addendum)
Subjective: No complaints, no chest pain Has not seen GI in   Objective: Vital signs in last 24 hours: Temp:  [97.6 F (36.4 C)-98.5 F (36.9 C)] 98 F (36.7 C) (12/20 0730) Pulse Rate:  [94-103] 94  (12/20 0400) Resp:  [18-35] 19  (12/20 0400) BP: (89-156)/(43-103) 133/70 mmHg (12/20 0352) SpO2:  [87 %-100 %] 98 % (12/20 0901) Weight:  [117.4 kg (258 lb 13.1 oz)] 117.4 kg (258 lb 13.1 oz) (12/20 0459) Weight change: -2.7 kg (-5 lb 15.2 oz) Last BM Date: 09/14/12 Intake/Output from previous day: -1510 12/19 0701 - 12/20 0700 In: 540 [P.O.:200; I.V.:340] Out: 2050 [Urine:2050] Intake/Output this shift:    PE: General:alert and oriented no complaints, no BM since Monday Heart:S1S2 RRR Lungs:decreased breath sounds in bases Abd:+ BS, soft, non tender Ext: tr to 1+ edema  Neuro:alert and oriented  Rectal: external hemrrhoids, flat, no masses palpated, stool light brown  Lab Results:  Basename 09/17/12 0425 09/16/12 1221 09/15/12 0508  WBC -- 11.2* 9.3  HGB 7.2* 7.5* --  HCT 23.0* 23.3* --  PLT -- 400 391   BMET  Basename 09/17/12 0425 09/16/12 1221  NA 134* 130*  K 4.7 4.8  CL 98 96  CO2 23 24  GLUCOSE 156* 80  BUN 29* 29*  CREATININE 1.56* 1.66*  CALCIUM 8.8 8.8    Basename 09/15/12 2101 09/14/12 2025  TROPONINI <0.30 <0.30    No results found for this basename: CHOL,  HDL,  LDLCALC,  LDLDIRECT,  TRIG,  CHOLHDL   Lab Results  Component Value Date   HGBA1C 6.1* 09/14/2012     Lab Results  Component Value Date   TSH 1.332 09/14/2012    Hepatic Function Panel  Basename 09/14/12 2207  PROT 7.5  ALBUMIN 2.2*  AST 16  ALT 18  ALKPHOS 79  BILITOT 0.2*  BILIDIR 0.1  IBILI 0.1*     Studies/Results: No results found.  Medications: I have reviewed the patient's current medications.    . feeding supplement  237 mL Oral BID BM  . ferrous sulfate  325 mg Oral TID WC  . furosemide  40 mg Oral Daily  . hydroxychloroquine  200 mg Oral BID   . insulin aspart  0-15 Units Subcutaneous TID WC  . insulin aspart  0-5 Units Subcutaneous QHS  . ipratropium  0.5 mg Nebulization TID  . levalbuterol  0.63 mg Nebulization TID  . predniSONE  5 mg Oral Q breakfast  . rivaroxaban  15 mg Oral Q supper   Assessment/Plan: Principal Problem:  *Atrial flutter with rapid ventricular response, s/p TEE/DCCV 09/16/12 now SR Active Problems:  Diabetes mellitus type 2 in obese  SLE (systemic lupus erythematosus)  Hypoglycemia associated with diabetes  Chest pain, secondary to HR and anemia, resolved  HTN (hypertension)  Morbid obesity  Neuropathy in diabetes  COPD (chronic obstructive pulmonary disease)  Hyperkalemia  Anemia, acute with drop in H/H and need for anticoagulation  Mediastinal lymphadenopathy  Hyponatremia  Acute CHF  Acute renal insufficiency  Chronic anticoagulation, due to a fib, on xarelto 09/16/12  CKD (chronic kidney disease) stage 3, GFR 30-59 ml/min  PLAN:Maintaining SR s/p TEE/DCCV, VS stable  Still SOB with any exertion.  She states she is not this SOB at home.  H/H low transfuse?  Hx of colonoscopy with snare polpectomy by Dr. [name]  In 2005, have GI see here now that she is on Xarelto. She has a history of Iron def. Anemia but now Hgb  continues to decrease. Rectal completed and card sent to lab. CKD stage 3 in 2008 she was normal. Ambulate, IV to saline lock.  Transfer to tele.  LOS: 3 days   INGOLD,LAURA R 09/17/2012, 11:45 AM  I have seen and evaluated the patient this afternoon along with Cecilie Kicks, NP. I agree with her findings, examination as well as impression recommendations.  Remaining in NSR ost DCCV and HR stable.  Remains dyspneic -- probably due to significant anemia.  Will transfuse at least 1 Unit PRBC (monitor closely for needing diuresis).   May need a second unit depending upon f/u Hgb.  GI consulted.  Unfortunately did not have notable drop in Hgb until post DCCV.  Post transfusion, will  need to start oral Iron supplementation.  Unfortunately, with DCCV - really needs 30 d uninterrupted anticoagulation.  Currently on Xarelto -- may want to convert to Eliquis for better safety profile.  Xarelto chosen initially -- probably due to once daily dosing.  BP quite elevated -- will start B2 specific BB.  Renal function is at least stable -- should get better after transfusion.  Sodium level has improved. Remains on PO Lasix with no suggestion of CP.    Transfer to Telemetry if stable post transfusion.    Dispo: pending symptomatic improvement post-transfusion.  Leonie Man, M.D., M.S. [facility] [address] [place], [place]  [address]  [phone] Pager # [phone] 09/17/2012 3:49 PM    Hemoccult card checked.

## 2012-09-18 LAB — GLUCOSE, CAPILLARY
Glucose-Capillary: 212 mg/dL — ABNORMAL HIGH (ref 70–99)
Glucose-Capillary: 204 mg/dL — ABNORMAL HIGH (ref 70–99)

## 2012-09-18 LAB — CBC
HCT: 26.7 % — ABNORMAL LOW (ref 36.0–46.0)
MCHC: 32.6 g/dL (ref 30.0–36.0)
Platelets: 412 10*3/uL — ABNORMAL HIGH (ref 150–400)
RDW: 15.2 % (ref 11.5–15.5)

## 2012-09-18 LAB — BASIC METABOLIC PANEL
BUN: 22 mg/dL (ref 6–23)
GFR calc Af Amer: 48 mL/min — ABNORMAL LOW (ref >90)
GFR calc non Af Amer: 41 mL/min — ABNORMAL LOW (ref >90)
Potassium: 4.3 mEq/L (ref 3.5–5.1)

## 2012-09-18 MED ORDER — METOPROLOL TARTRATE 50 MG PO TABS
50.0000 mg | ORAL_TABLET | Freq: Two times a day (BID) | ORAL | Status: DC
  Filled 2012-09-18: qty 1

## 2012-09-18 MED ORDER — LISINOPRIL 5 MG PO TABS
5.0000 mg | ORAL_TABLET | Freq: Every day | ORAL | Status: DC
  Administered 2012-09-18: 5 mg via ORAL
  Filled 2012-09-18: qty 1

## 2012-09-18 MED ORDER — LISINOPRIL 2.5 MG PO TABS
2.5000 mg | ORAL_TABLET | Freq: Every day | ORAL | Status: DC
  Administered 2012-09-19 – 2012-09-20 (×2): 2.5 mg via ORAL
  Filled 2012-09-18 (×2): qty 1

## 2012-09-18 MED ORDER — METOPROLOL TARTRATE 25 MG PO TABS
25.0000 mg | ORAL_TABLET | ORAL | Status: AC
  Administered 2012-09-18: 25 mg via ORAL
  Filled 2012-09-18: qty 1

## 2012-09-18 MED ORDER — WHITE PETROLATUM GEL
Status: AC
  Administered 2012-09-18: 08:00:00
  Filled 2012-09-18: qty 5

## 2012-09-18 MED ORDER — METOPROLOL TARTRATE 25 MG PO TABS
37.5000 mg | ORAL_TABLET | Freq: Two times a day (BID) | ORAL | Status: DC
  Administered 2012-09-18 – 2012-09-20 (×4): 37.5 mg via ORAL
  Filled 2012-09-18 (×5): qty 1

## 2012-09-18 NOTE — Progress Notes (Signed)
Subjective: No overt blood in stool.  Objective: Vital signs in last 24 hours: Temp:  [97.8 F (36.6 C)-98.8 F (37.1 C)] 98.4 F (36.9 C) (12/21 1200) Pulse Rate:  [91-100] 98  (12/21 0935) Resp:  [22-29] 28  (12/21 0400) BP: (128-185)/(43-97) 167/81 mmHg (12/21 1200) SpO2:  [96 %-98 %] 97 % (12/21 0742) Weight:  [116 kg (255 lb 11.7 oz)] 116 kg (255 lb 11.7 oz) (12/21 0333) Weight change: -1.4 kg (-3 lb 1.4 oz) Last BM Date: 09/14/12  PE: GEN:  Overweight, NAD  Lab Results: CBC    Component Value Date/Time   WBC 11.4* 09/18/2012 0430   RBC 3.14* 09/18/2012 0430   HGB 8.7* 09/18/2012 0430   HCT 26.7* 09/18/2012 0430   PLT 412* 09/18/2012 0430   MCV 85.0 09/18/2012 0430   MCH 27.7 09/18/2012 0430   MCHC 32.6 09/18/2012 0430   RDW 15.2 09/18/2012 0430   LYMPHSABS 0.9 09/04/2012 1015   MONOABS 0.8 09/04/2012 1015   EOSABS 0.0 09/04/2012 1015   BASOSABS 0.0 09/04/2012 1015   Assessment:  1.  Multiple cardiac troubles, Afib with RVR with cardioversion.  2.  Iron-deficiency anemia, hemoccult-negative, without overt GI tract bleeding.  Plan:  1.  I have offered inpatient endoscopy and colonoscopy to expedite her anemia work-up.  Patient is under impression she is being discharged in the next day or two, and does not want these procedures done at this time. 2.  Will arrange outpatient follow-up with Dr. Penelope Coop for outpatient endoscopy and colonoscopy.  Would likely need to be done with propofol.  Will need permission from Dr. Sallyanne Kuster et al. at that time to hold xarelto 24-48 hours pre-procedures. 3.  Will sign-off; please call with questions; thank you for the consult.   Landry Dyke 09/18/2012, 1:25 PM

## 2012-09-18 NOTE — Progress Notes (Signed)
Pt's BP 176/66 HR 100 in sinus rhythm. Cecilie Kicks, PA, notified. New orders received for 25 mg metoprolol PO STAT. Will administer and continue to monitor.

## 2012-09-18 NOTE — Progress Notes (Addendum)
CARDIAC REHAB PHASE I   PRE:  Rate/Rhythm: 97 SR  BP:  Supine:   Sitting: 170/85  Standing:    SaO2: 99 RA  MODE:  Ambulation: 240 ft   POST:  Rate/Rhythem: 105  BP:  Supine:   Sitting: 178/80  Standing:    SaO2: 98 RA 1555-1630 Assisted X 1 to ambulate. Pt tolerated walk better today. She is still DOE and tires but better. RA sats during walk 98-99%. Pt able to increase distance to 240 feet with one standing rest stop. Pt to recliner after walk with call light in reach.  Deon Pilling

## 2012-09-18 NOTE — Progress Notes (Signed)
Subjective: No complaints, no chest pain Has not seen GI in [place]  Objective: Vital signs in last 24 hours: Temp:  [97.8 F (36.6 C)-98.8 F (37.1 C)] 98.3 F (36.8 C) (12/21 0800) Pulse Rate:  [91-100] 98  (12/21 0935) Resp:  [22-29] 28  (12/21 0400) BP: (128-185)/(43-97) 161/55 mmHg (12/21 0935) SpO2:  [96 %-98 %] 97 % (12/21 0742) Weight:  [116 kg (255 lb 11.7 oz)] 116 kg (255 lb 11.7 oz) (12/21 0333) Weight change: -1.4 kg (-3 lb 1.4 oz) Last BM Date: 09/14/12 Intake/Output from previous day: -1510 12/20 0701 - 12/21 0700 In: 947.5 [P.O.:360; I.V.:250; Blood:337.5] Out: 2075 [Urine:2075] Intake/Output this shift: Total I/O In: 360 [P.O.:360] Out: 200 [Urine:200]  PE: General:alert and oriented no complaints, no BM since Monday Heart:S1S2 RRR Lungs:decreased breath sounds in bases Abd:+ BS, soft, non tender Ext: tr to 1+ edema  Neuro:alert and oriented  Rectal: external hemrrhoids, flat, no masses palpated, stool light brown  Lab Results:  Basename 09/18/12 0430 09/17/12 0425 09/16/12 1221  WBC 11.4* -- 11.2*  HGB 8.7* 7.2* --  HCT 26.7* 23.0* --  PLT 412* -- 400   BMET  Basename 09/18/12 0430 09/17/12 0425  NA 134* 134*  K 4.3 4.7  CL 96 98  CO2 25 23  GLUCOSE 174* 156*  BUN 22 29*  CREATININE 1.31* 1.56*  CALCIUM 9.0 8.8    Basename 09/15/12 2101  TROPONINI <0.30    No results found for this basename: CHOL,  HDL,  LDLCALC,  LDLDIRECT,  TRIG,  CHOLHDL   Lab Results  Component Value Date   HGBA1C 6.1* 09/14/2012     Lab Results  Component Value Date   TSH 1.332 09/14/2012    Hepatic Function Panel No results found for this basename: PROT,ALBUMIN,AST,ALT,ALKPHOS,BILITOT,BILIDIR,IBILI in the last 72 hours   Studies/Results: No results found.  Medications: I have reviewed the patient's current medications.    . docusate sodium  200 mg Oral Daily  . feeding supplement  237 mL Oral BID BM  . ferrous sulfate  325 mg Oral TID WC  .  furosemide  40 mg Oral Daily  . hydroxychloroquine  200 mg Oral BID  . insulin aspart  0-15 Units Subcutaneous TID WC  . insulin aspart  0-5 Units Subcutaneous QHS  . ipratropium  0.5 mg Nebulization TID  . levalbuterol  0.63 mg Nebulization TID  . lisinopril  5 mg Oral Daily  . metoprolol tartrate  50 mg Oral BID  . polyethylene glycol  17 g Oral Daily  . predniSONE  5 mg Oral Q breakfast  . rivaroxaban  15 mg Oral Q supper   Assessment/Plan: Principal Problem:  *Atrial flutter with rapid ventricular response, s/p TEE/DCCV 09/16/12 now SR Active Problems:  Morbid obesity  Diabetes mellitus type 2 in obese  Neuropathy in diabetes  SLE (systemic lupus erythematosus)  Hypoglycemia associated with diabetes  HTN (hypertension)  COPD (chronic obstructive pulmonary disease)  Hyperkalemia  Anemia, acute with drop in H/H and need for anticoagulation  Mediastinal lymphadenopathy  Hyponatremia  Acute CHF  Chest pain, secondary to HR and anemia, resolved  Acute renal insufficiency  Chronic anticoagulation, due to a fib, on xarelto 09/16/12  CKD (chronic kidney disease) stage 3, GFR 30-59 ml/min  PLAN: Maintaining SR s/p TEE/DCCV, VS stable - BP & HR still up  H/H with appropriate increase post transfusion  Renal function improving & diuresing well.  Sodium level has improved.  Has not ambulated today, so will  need to reassess dyspnea.   GI consulted.  Unfortunately did not have notable drop in Hgb until post DCCV.  Post transfusion, will need to start oral Iron supplementation.  She is stable from a cardiac standpoint for w/u as needed -- can be done as OP.  S/p DCCV - really needs 30 d uninterrupted anticoagulation.  Currently on Xarelto -- may want to convert to Eliquis for better safety profile.  Xarelto chosen initially -- probably due to once daily dosing.  BP quite elevated -- will increase B2 specific BB to 37.5 mg BID & Add low dose ACE-I as renal fxn stable   Moderate AS  -- ACE-I for afterload reduction.  Renal function is at least stable .  Remains on PO Lasix with no suggestion of CP.    Transfer to Telemetry   Dispo: pending symptomatic improvement post-transfusion & stable H/H in AM -- to ensure no further drop that would indicate active bleeding while on Xarelto.    LOS: 4 days   HARDING,DAVID W, M.D., M.S. [facility] [address] [place], [place]  [address]  [phone] Pager # [phone] 09/18/2012 10:45 AM    Hemoccult card checked.

## 2012-09-18 NOTE — Progress Notes (Signed)
Patient has changed her mind about waiting for a colonoscopy as outpatient, would like it down while here on this admission. Called and spoke to Dr. Paulita Fujita, order for clear liquids starting tomorrow am, and procedure possibly on Monday. Informed patient about MD new order and plans. Patient transferring to 4700 per MD order at this time.

## 2012-09-19 DIAGNOSIS — I272 Pulmonary hypertension, unspecified: Secondary | ICD-10-CM | POA: Diagnosis present

## 2012-09-19 DIAGNOSIS — R7989 Other specified abnormal findings of blood chemistry: Secondary | ICD-10-CM | POA: Diagnosis present

## 2012-09-19 DIAGNOSIS — I351 Nonrheumatic aortic (valve) insufficiency: Secondary | ICD-10-CM | POA: Diagnosis present

## 2012-09-19 LAB — BASIC METABOLIC PANEL
Calcium: 9.1 mg/dL (ref 8.4–10.5)
GFR calc non Af Amer: 47 mL/min — ABNORMAL LOW (ref >90)
Glucose, Bld: 230 mg/dL — ABNORMAL HIGH (ref 70–99)
Sodium: 135 mEq/L (ref 135–145)

## 2012-09-19 LAB — CBC
MCH: 27.6 pg (ref 26.0–34.0)
MCHC: 32.3 g/dL (ref 30.0–36.0)
Platelets: 405 10*3/uL — ABNORMAL HIGH (ref 150–400)

## 2012-09-19 LAB — GLUCOSE, CAPILLARY: Glucose-Capillary: 130 mg/dL — ABNORMAL HIGH (ref 70–99)

## 2012-09-19 MED ORDER — PEG 3350-KCL-NA BICARB-NACL 420 G PO SOLR
4000.0000 mL | Freq: Once | ORAL | Status: AC
  Administered 2012-09-19: 4000 mL via ORAL
  Filled 2012-09-19: qty 4000

## 2012-09-19 MED ORDER — SODIUM CHLORIDE 0.9 % IV SOLN
INTRAVENOUS | Status: DC
  Administered 2012-09-20: 04:00:00 via INTRAVENOUS
  Administered 2012-09-20: 500 mL via INTRAVENOUS

## 2012-09-19 MED ORDER — RIVAROXABAN 15 MG PO TABS
15.0000 mg | ORAL_TABLET | Freq: Every day | ORAL | Status: DC
  Administered 2012-09-20: 15 mg via ORAL
  Filled 2012-09-19: qty 1

## 2012-09-19 MED ORDER — RIVAROXABAN 15 MG PO TABS
15.0000 mg | ORAL_TABLET | Freq: Once | ORAL | Status: AC
  Administered 2012-09-19: 15 mg via ORAL
  Filled 2012-09-19: qty 1

## 2012-09-19 NOTE — Progress Notes (Addendum)
Subjective:  Still has some DOE but improved from admission.  Objective:  Vital Signs in the last 24 hours: Temp:  [97.8 F (36.6 C)-99.5 F (37.5 C)] 97.8 F (36.6 C) (12/22 0544) Pulse Rate:  [87-98] 87  (12/22 0544) Resp:  [19-20] 19  (12/22 0544) BP: (131-185)/(55-81) 131/63 mmHg (12/22 0544) SpO2:  [96 %-99 %] 96 % (12/22 0840) Weight:  [112.4 kg (247 lb 12.8 oz)-114.3 kg (251 lb 15.8 oz)] 112.4 kg (247 lb 12.8 oz) (12/22 0500)  Intake/Output from previous day:  Intake/Output Summary (Last 24 hours) at 09/19/12 [id number] Last data filed at 09/19/12 0544  Gross per 24 hour  Intake    600 ml  Output   1725 ml  Net  -1125 ml    Physical Exam: General appearance: alert, cooperative, no distress and morbidly obese Lungs: clear to auscultation bilaterally Heart: regular rate and rhythm and 2/6 systolic murmur LSB; 1/6 diastolic M @ base. Abd: obese, soft/NT/ND/NABS   Rate: 90  Rhythm: indeterminate, NSR on exam, telemetry system is down  Lab Results:  Basename 09/19/12 0620 09/18/12 0430  WBC 8.8 11.4*  HGB 9.4* 8.7*  PLT 405* 412*    Basename 09/19/12 0620 09/18/12 0430  NA 135 134*  K 4.7 4.3  CL 96 96  CO2 23 25  GLUCOSE 230* 174*  BUN 19 22  CREATININE 1.18* 1.31*   Imaging results have been reviewed  Cardiac Studies:  Assessment/Plan:   Principal Problem:  *Atrial flutter with rapid ventricular response, s/p TEE/DCCV 09/16/12 now SR Active Problems:  Anemia, transfused, GI w/u as an OP  Acute on chronic diastolic congestive heart failure  Acute renal insufficiency, improving  Chronic anticoagulation, due to a fib, on xarelto 09/16/12  Morbid obesity  Diabetes mellitus type 2 in obese  Neuropathy in diabetes  SLE (systemic lupus erythematosus)  HTN (hypertension)  COPD (chronic obstructive pulmonary disease)  Mediastinal lymphadenopathy, ( ? sarcoid)  Chest pain, secondary to HR and anemia, resolved  CKD (chronic kidney disease) stage 3, GFR 30-59  ml/min  Aortic stenosis, mild  Pulmonary HTN, PA 59mmhg  Positive D dimer- 10, CTA negative  Plan- Dr Ellyn Hack to see. She needs endo and colonoscopy -- planned for tomorrow per GI. Remaining in NSR.  Diuresing well.  Kerin Ransom PA-C 09/19/2012, 9:22 AM  I have seen and evaluated the patient this morning along with Kerin Ransom, PA. I agree with his/her findings, examination as well as impression recommendations.  She looks & feels better today.  H/H stable.  HP improved & BP better. -- anticipate increasing BB to 50mg  bid prior to d/c. Diuresing well on PO Lasix. & renal function stable to improved.  Glycemic control ok.  Plan is for GI procedures in AM -- called by Dr. Paulita Fujita last PM.  Will dose AM Xarelto early today & post Procedure tomorrow.  Anticipate d/c tomorrow post Procedure if stable HR/BP/Hgb. Would anticipate d/c on 20 mg Xarelto as renal function improved.   Leonie Man, M.D., M.S. [facility] [address] [place], [place]  [address]  [phone] Pager # [phone] 09/19/2012 9:55 AM

## 2012-09-19 NOTE — Progress Notes (Signed)
Subjective: No blood in stool. No voiced complaints.  Objective: Vital signs in last 24 hours: Temp:  [97.8 F (36.6 C)-99.5 F (37.5 C)] 97.8 F (36.6 C) (12/22 0544) Pulse Rate:  [87-93] 87  (12/22 0544) Resp:  [19-20] 19  (12/22 0544) BP: (131-185)/(63-81) 131/63 mmHg (12/22 0544) SpO2:  [96 %-99 %] 96 % (12/22 0840) Weight:  [112.4 kg (247 lb 12.8 oz)-114.3 kg (251 lb 15.8 oz)] 112.4 kg (247 lb 12.8 oz) (12/22 0500) Weight change: -1.7 kg (-3 lb 12 oz) Last BM Date: 09/15/12  PE: GEN:  Mild tachypnea  Lab Results: CBC    Component Value Date/Time   WBC 8.8 09/19/2012 0620   RBC 3.41* 09/19/2012 0620   HGB 9.4* 09/19/2012 0620   HCT 29.1* 09/19/2012 0620   PLT 405* 09/19/2012 0620   MCV 85.3 09/19/2012 0620   MCH 27.6 09/19/2012 0620   MCHC 32.3 09/19/2012 0620   RDW 15.1 09/19/2012 0620   LYMPHSABS 0.9 09/04/2012 1015   MONOABS 0.8 09/04/2012 1015   EOSABS 0.0 09/04/2012 1015   BASOSABS 0.0 09/04/2012 1015   Assessment:  1.  Iron-deficiency anemia, hemoccult-negative with no overt GI bleeding.  Plan:  1.  Endoscopy (with possible small bowel biopsies to exclude celiac disease) and colonoscopy, tentatively planned for tomorrow morning Monday 09/20/12.  Patient has been cleared by cardiology for her endoscopic procedures. 2.  Xarelto given early this morning, and patient will not receive tomorrow's dose until after her procedures; if nothing is removed that places patient at high risk for bleeding, will likely restart xarelto tomorrow post-procedure; if large polyp or some other high-risk bleeding lesion identified, she might need heparin for a couple days post-procedure instead of immediately restarting xarelto.  This was discussed with Dr. Ellyn Hack yesterday afternoon. 3.  Next step in management pending endoscopy and colonoscopy findings.   Landry Dyke 09/19/2012, 10:24 AM

## 2012-09-20 ENCOUNTER — Encounter (HOSPITAL_COMMUNITY)
Admission: EM | Disposition: A | Discharge status: Home / Self Care | Payer: Self-pay | Source: Home / Self Care | Attending: Cardiovascular Disease

## 2012-09-20 ENCOUNTER — Encounter (HOSPITAL_COMMUNITY): Payer: Self-pay | Admitting: Gastroenterology

## 2012-09-20 HISTORY — PX: COLONOSCOPY: SHX5424

## 2012-09-20 HISTORY — PX: ESOPHAGOGASTRODUODENOSCOPY: SHX5428

## 2012-09-20 LAB — HEMOGLOBIN AND HEMATOCRIT, BLOOD
HCT: 29 % — ABNORMAL LOW (ref 36.0–46.0)
Hemoglobin: 9.1 g/dL — ABNORMAL LOW (ref 12.0–15.0)

## 2012-09-20 LAB — GLUCOSE, CAPILLARY: Glucose-Capillary: 220 mg/dL — ABNORMAL HIGH (ref 70–99)

## 2012-09-20 LAB — BASIC METABOLIC PANEL
CO2: 23 mEq/L (ref 19–32)
Chloride: 102 mEq/L (ref 96–112)
GFR calc Af Amer: 65 mL/min — ABNORMAL LOW (ref >90)
Sodium: 137 mEq/L (ref 135–145)

## 2012-09-20 SURGERY — EGD (ESOPHAGOGASTRODUODENOSCOPY)
Anesthesia: Moderate Sedation | Laterality: Left

## 2012-09-20 MED ORDER — MIDAZOLAM HCL 5 MG/ML IJ SOLN
INTRAMUSCULAR | Status: AC
  Filled 2012-09-20: qty 4

## 2012-09-20 MED ORDER — MIDAZOLAM HCL 10 MG/2ML IJ SOLN
INTRAMUSCULAR | Status: DC | PRN
  Administered 2012-09-20 (×6): 2 mg via INTRAVENOUS

## 2012-09-20 MED ORDER — METOPROLOL TARTRATE 50 MG PO TABS: 50.0000 mg | ORAL_TABLET | Freq: Two times a day (BID) | ORAL | Status: DC

## 2012-09-20 MED ORDER — LISINOPRIL 5 MG PO TABS: 5.0000 mg | ORAL_TABLET | Freq: Every day | ORAL | Status: DC

## 2012-09-20 MED ORDER — METOPROLOL TARTRATE 50 MG PO TABS
50.0000 mg | ORAL_TABLET | Freq: Two times a day (BID) | ORAL | Status: DC
  Filled 2012-09-20: qty 1

## 2012-09-20 MED ORDER — BUTAMBEN-TETRACAINE-BENZOCAINE 2-2-14 % EX AERO
INHALATION_SPRAY | CUTANEOUS | Status: DC | PRN
  Administered 2012-09-20: 2 via TOPICAL

## 2012-09-20 MED ORDER — FENTANYL CITRATE 0.05 MG/ML IJ SOLN
INTRAMUSCULAR | Status: AC
  Filled 2012-09-20: qty 4

## 2012-09-20 MED ORDER — FUROSEMIDE 20 MG PO TABS: 20.0000 mg | ORAL_TABLET | Freq: Every day | ORAL | Status: DC

## 2012-09-20 MED ORDER — LEVALBUTEROL HCL 0.63 MG/3ML IN NEBU
0.6300 mg | INHALATION_SOLUTION | Freq: Four times a day (QID) | RESPIRATORY_TRACT | Status: DC | PRN
  Filled 2012-09-20: qty 3

## 2012-09-20 MED ORDER — DIPHENHYDRAMINE HCL 50 MG/ML IJ SOLN
INTRAMUSCULAR | Status: AC
  Filled 2012-09-20: qty 1

## 2012-09-20 MED ORDER — LISINOPRIL 5 MG PO TABS
5.0000 mg | ORAL_TABLET | Freq: Every day | ORAL | Status: DC
  Administered 2012-09-20: 5 mg via ORAL
  Filled 2012-09-20: qty 1

## 2012-09-20 MED ORDER — FENTANYL CITRATE 0.05 MG/ML IJ SOLN
INTRAMUSCULAR | Status: DC | PRN
  Administered 2012-09-20 (×5): 25 ug via INTRAVENOUS

## 2012-09-20 MED ORDER — IPRATROPIUM BROMIDE 0.02 % IN SOLN: 0.5000 mg | RESPIRATORY_TRACT | Status: DC | PRN

## 2012-09-20 MED ORDER — RIVAROXABAN 20 MG PO TABS: 20.0000 mg | ORAL_TABLET | Freq: Every day | ORAL | Status: DC

## 2012-09-20 NOTE — Progress Notes (Signed)
09/20/12 Patient discharged this evening. Patient and husband verbalizes understanding of all discharge paperwork and prescription pick up. Catha Gosselin RN.

## 2012-09-20 NOTE — Interval H&P Note (Signed)
History and Physical Interval Note:  09/20/2012 9:25 AM  [name]  has presented today for surgery, with the diagnosis of iron-deficiency anemia  The various methods of treatment have been discussed with the patient and family. After consideration of risks, benefits and other options for treatment, the patient has consented to  Procedure(s) (LRB) with comments: ESOPHAGOGASTRODUODENOSCOPY (EGD) (Left) COLONOSCOPY (Left) as a surgical intervention .  The patient's history has been reviewed, patient examined, no change in status (s/p cardioversion, Xarelto on hold), stable for surgery.  I have reviewed the patient's chart and labs.  Questions were answered to the patient's satisfaction.     Cleotis Nipper

## 2012-09-20 NOTE — Progress Notes (Signed)
Endoscopy and colonoscopy were well tolerated, and unrevealing for any definite source of anemia.  The patient did have a duodenal erosion and a possible resolving ulcer on the angularis, for which reason I would recommend PPI therapy for at least the next couple of weeks, or indefinitely if the patient should be on long-term aspirin therapy. It is conceivable that the aspirin that she was on prior to admission led to mucosal injury and chronic blood loss, with some resolution of the abnormality by the time of this admission.  The colon did show several small polyps which were removed by cold snare technique. As mentioned in my procedure note, it would be ideal if anticoagulation can be deferred for another couple of days before being restarted. However, since no cautery was used to remove these polyps, the likelihood of significant hemorrhage, even if anticoagulation were to be restarted today, would be quite low. I have placed a call to Dr. Ellyn Hack, who is currently doing a cardiac catheterization, to discuss this in more detail.  Cleotis Nipper, M.D. [phone]

## 2012-09-20 NOTE — Op Note (Signed)
[facility] [place], [address]   COLONOSCOPY PROCEDURE REPORT  PATIENT: Dana, Little  MR#: [id number] BIRTHDATE: 1945-12-02 , 66  yrs. old GENDER: Female ENDOSCOPIST: Ronald Lobo, MD REFERRED BY:   (PCP) Sharilyn Sites, MD PROCEDURE DATE:  09/20/2012 PROCEDURE:   colonoscopy with polypectomy and biopsy ASA CLASS: INDICATIONS:  iron deficiency anemia, heme-negative stool x1, recently started on xarelto for new onset atrial fibrillation, status post cardioversion; EGD had minimal findings MEDICATIONS:    fentanyl 125 mcg, Versed 12 mg IV (including EGD meds)  DESCRIPTION OF PROCEDURE: the patient was brought from her hospital room to the [facility] cone endoscopy unit following a PEG prep. This procedure was done immediately following her upper endoscopy. She remains stable throughout both procedures.  The Pentax adult video colonoscope was advanced through a slightly tortuous sigmoid region, that without significant difficulty to the cecum as identified by clear visualization of the appendiceal orifice. Pullback was then performed. The quality of the prep was excellent and is felt that all areas were well seen.  There were 3 small polyps removed by cold snare technique during this exam. Each was about 7 or 8 mm in diameter. One was in the descending colon, another in the proximal transverse, and finally one in the distal transverse colon. There was also a diminutive sessile polyp in the ascending colon removed by cold biopsy.  there was no significant bleeding with the removal of any of these polyps.  This was otherwise a normal examination. There was no evidence of vascular ectasia, colitis, masses, or diverticular disease.  Retroflexion in the rectum and reinspection of the rectum were unremarkable.  The patient tolerated the procedure well.     COMPLICATIONS: None  ENDOSCOPIC IMPRESSION:  1. Several small polyps  removed as described above. 2. No source of iron deficiency anemia evident on this exam 3. Otherwise normal colonoscopy  RECOMMENDATIONS:  1. Await pathology on polyps. Consider surveillance colonoscopy in 3-5 years, depending half findings and the patient's overall medical status at that time, taking into account her significant medical problems including atrial fibrillation, chronic anticoagulation, obesity, diabetes, etc.  2. The patient could be safely started back on anticoagulation in 2-3 days, since no cautery was used for removal of her polyps. If it is felt to be strongly indicated, anticoagulation can actually be resumed today, with very minimal risk of inciting hemorrhage.    _______________________________ eSigned:  Ronald Lobo, MD 09/20/2012 10:42 AM     PATIENT NAME:  Dana, Little MR#: [id number]

## 2012-09-20 NOTE — Op Note (Signed)
[facility] [place], [address]   ENDOSCOPY PROCEDURE REPORT  PATIENT: [name], [name]  MR#: [id number] BIRTHDATE: 02/27/1946 , 66  yrs. old GENDER: Female ENDOSCOPIST:Tonesha Tsou, MD REFERRED BY:  (PCP) Sharilyn Sites, MD PROCEDURE DATE:  09/20/2012 PROCEDURE: esophagogastroduodenoscopy ASA CLASS: INDICATIONS:   iron deficiency anemia in a patient admitted to the hospital with new onset A. fib, started on Xarelto post cardioversion, was on aspirin prior to admission, stool Hemoccult negative x1 in the emergency room MEDICATION:    fentanyl 75 mcg, Versed 6 mg IV TOPICAL ANESTHETIC:    Cetacaine spray x2  DESCRIPTION OF PROCEDURE:   the patient was brought from her hospital room to the [facility] cone endoscopy unit and received the above sedation, without any clinical instability. The Pentax video endoscope was passed under direct vision. The vocal cords were not well seen.  The esophagus was normal, specifically without hiatal hernia or evidence of reflux esophagitis, Barrett's esophagus, infection, neoplasia, or varices. No ring or stricture was identified.  The stomach was entered. It contained a small clear residual which was suctioned out. There were a few mucosal hemorrhages on the fundic folds along the greater curve, but no blood or coffee-ground material within the stomach. The angularis had a crevice without exudate, raising the question of a previous, recently healed ulcer in that location. However, no active ulcer disease was identified at the time this exam. The remainder of the stomach was normal, including a retroflex view the cardia.  The pylorus was normal and widely patent.  The duodenal bulb had a 6 mm diameter area of exudate suggestive of an erosion. There was no crater to suggest an ulcer. There was a little bit of dark brown spots in the area of exudate, raising the question of possible recent hemorrhage.  The second portion of the duodenum looked normal.     COMPLICATIONS: None  ENDOSCOPIC IMPRESSION:  1. Duodenal erosion and possible resolving ulcer at the angularis 2. No definite source of patient's iron deficiency anemia identified on this exam, but the above findings could conceivably explain it  RECOMMENDATIONS:  1. Anti-peptic therapy (PPI) for at least 2 weeks. If ongoing aspirin therapy is used in the future, I would maintain on PPI prophylaxis indefinitely. 2. Proceed to colonoscopic evaluation for evaluation of iron deficiency anemia and for screening purposes, since the patient states it has been many years since her last colonoscopy which was done in [place]    _______________________________ eSigned:  Ronald Lobo, MD 09/20/2012 10:30 AM    PATIENT NAME:  [name], [name] MR#: [id number]

## 2012-09-20 NOTE — Progress Notes (Signed)
Subjective:  Recovering from Endoscopy.  No complaints.  Breathing fine.  Objective:  Vital Signs in the last 24 hours: Temp:  [98 F (36.7 C)-98.3 F (36.8 C)] 98 F (36.7 C) (12/23 1429) Pulse Rate:  [82-90] 89  (12/23 1429) Resp:  [16-30] 20  (12/23 1429) BP: (111-194)/(47-121) 182/68 mmHg (12/23 1429) SpO2:  [89 %-100 %] 95 % (12/23 1445) Weight:  [112 kg (246 lb 14.6 oz)] 112 kg (246 lb 14.6 oz) (12/23 0605)  Intake/Output from previous day:  Intake/Output Summary (Last 24 hours) at 09/20/12 1503 Last data filed at 09/20/12 1300  Gross per 24 hour  Intake   3360 ml  Output   1500 ml  Net   1860 ml    Physical Exam: General appearance: alert, cooperative, no distress and morbidly obese Lungs: clear to auscultation bilaterally, non-labored. Heart: distant HS; regular rate and rhythm and 2/6 systolic murmur LSB; 1/6 diastolic M @ base. Abd: obese, soft/NT/ND/NABS   Rate: 90  Rhythm: , NSR - 80s  Lab Results:  Basename 09/19/12 0620 09/18/12 0430  WBC 8.8 11.4*  HGB 9.4* 8.7*  PLT 405* 412*    Basename 09/19/12 0620 09/18/12 0430  NA 135 134*  K 4.7 4.3  CL 96 96  CO2 23 25  GLUCOSE 230* 174*  BUN 19 22  CREATININE 1.18* 1.31*   Imaging results have been reviewed   Assessment/Plan:   Principal Problem:  *Atrial flutter with rapid ventricular response, s/p TEE/DCCV 09/16/12 now SR Active Problems:  Morbid obesity  Diabetes mellitus type 2 in obese  Neuropathy in diabetes  SLE (systemic lupus erythematosus)  HTN (hypertension)  COPD (chronic obstructive pulmonary disease)  Anemia, transfused, colon/ endo done 09/20/12  Mediastinal lymphadenopathy, ( ? sarcoid)  Acute on chronic diastolic congestive heart failure  Chest pain, secondary to HR and anemia, resolved  Acute renal insufficiency, improving - essentially resolved.  Chronic anticoagulation, due to a fib, on xarelto 09/16/12  CKD (chronic kidney disease) stage 3, GFR 30-59 ml/min  Aortic  stenosis, mild  Pulmonary HTN, PA 63mmhg  Positive D dimer- 10, CTA negative  Endo procedures done today -- results reviewed & d/w Dr. Wallis Mart.  No active sites of concern - some gastric erosions, several small polyps snared.  Hgb stable with no active sign of GI Bleeding.  Will d/c on Iron supplement.  Unfortunately, post DCCV their is increased risk of CVA, so would prefer to restart Xarelto today -- will increase back to 20mg  on d/c as renal function improved.  Remains in NSR -- just hypertensive.  Increasing ACEI-I to 5mg  & BB to 50mg  bid.  Pending improvement of BP after ACE-I given (held this AM) -- anticipate that she will be ready for d/c later today.  Will check labs now - for final renal fnx & H/H assessment.  For now will reduce Lasix to 20 mg PO.  Will arrange close f/u with Dr. Sallyanne Kuster @ [place] office.  Leonie Man, M.D., M.S. [facility] [address] [place], [place]  [address]  [phone] Pager # [phone] 09/20/2012 3:03 PM

## 2012-09-20 NOTE — Progress Notes (Signed)
CARDIAC REHAB PHASE I   PRE:  Rate/Rhythm: 89 SR  BP:  Supine:   Sitting: 182/68  Standing:    SaO2: 97 RA  MODE:  Ambulation: 240 ft   POST:  Rate/Rhythem: 100  BP:  Supine:   Sitting: 183/61  Standing:    SaO2: 97 RA 1424-1445 Assisted X 1 to ambulate. Gait steady. Pt continues DOE and tires with walking. She was able to walk 240 feet with one standing rest stop. BP up before  and after walk. Pt back to chair after walk. She is anxious to go home,discussed with her nurse.  Deon Pilling

## 2012-09-21 ENCOUNTER — Encounter (HOSPITAL_COMMUNITY): Payer: Self-pay | Admitting: Gastroenterology

## 2012-09-21 LAB — TYPE AND SCREEN: Unit division: 0

## 2012-09-21 NOTE — Progress Notes (Signed)
Utilization Review Completed.   Nishan Ovens, RN, BSN Nurse Case Manager  [phone]  

## 2012-09-21 NOTE — Discharge Summary (Signed)
Patient ID: [name],  MRN: [id number], DOB/AGE: 1946-01-27 66 y.o.  Admit date: 09/14/2012 Discharge date: 09/20/2012  Primary Care Provider: Dr Hilma Favors Primary Cardiologist: Dr Sallyanne Kuster  Discharge Diagnoses Principal Problem:  *Atrial flutter with rapid ventricular response, s/p TEE/DCCV 09/16/12 now SR Active Problems:  Anemia, transfused, colon/ endo done 09/20/12  Acute on chronic diastolic congestive heart failure  Acute renal insufficiency, improving  Chronic anticoagulation, due to a fib, on xarelto 09/16/12  Morbid obesity  Diabetes mellitus type 2 in obese  Neuropathy in diabetes  SLE (systemic lupus erythematosus)  HTN (hypertension)  COPD (chronic obstructive pulmonary disease)  Mediastinal lymphadenopathy, ( ? sarcoid)  Chest pain, secondary to HR and anemia, resolved  CKD (chronic kidney disease) stage 3, GFR 30-59 ml/min  Aortic stenosis, mild  Pulmonary HTN, PA 59mmhg  Positive D dimer- 10, CTA negative    Procedures: TEE/DCCV 09/16/12   Hospital Course:  66 y/o morbidly obese female followed previously by Dr Melvern Banker with a history of Obesity, DM, HTN, SLE, Anemia. She was admitted 09/14/12 with dyspnea and palpitations. She was noted to be in A flutter. Echo showed an EF of [id number] and diastolic dysfunction. She was diuresed and set up for TEE/DCCV. She was noted to be anemic and we had GI see her after her DCCV. She was transfused this admission. Colonoscopy and endoscopy were performed showing no obvious site of bleeding. It was felt we could anticoagulate her safely. She had acute renal insufficiency on admission but this normalized by discharge and she was discharged on Xarelto 20mg . Arrangements were made for her to come to the office in [place] for a starter pack as she was discharged late on the 23d. She was not on Lantus during this hospitalization and her BS was stable. She is instructed to follow her BS at home and follow up with Dr Hilma Favors after  discharge. Dr Sallyanne Kuster will see her in [place] for cardiology follow up.  Discharge Vitals:  Blood pressure 145/95, pulse 89, temperature 98 F (36.7 C), temperature source Oral, resp. rate 20, height 5\' 5"  (1.651 m), weight 112 kg (246 lb 14.6 oz), SpO2 95.00%.    Labs: Results for orders placed during the hospital encounter of 09/14/12 (from the past 48 hour(s))  GLUCOSE, CAPILLARY     Status: Abnormal   Collection Time   09/19/12 11:14 AM      Component Value Range Comment   Glucose-Capillary 203 (*) 70 - 99 mg/dL   GLUCOSE, CAPILLARY     Status: Abnormal   Collection Time   09/19/12  4:06 PM      Component Value Range Comment   Glucose-Capillary 137 (*) 70 - 99 mg/dL   GLUCOSE, CAPILLARY     Status: Abnormal   Collection Time   09/19/12  9:13 PM      Component Value Range Comment   Glucose-Capillary 130 (*) 70 - 99 mg/dL    Comment 1 Notify RN     GLUCOSE, CAPILLARY     Status: Abnormal   Collection Time   09/20/12  5:56 AM      Component Value Range Comment   Glucose-Capillary 146 (*) 70 - 99 mg/dL   GLUCOSE, CAPILLARY     Status: Abnormal   Collection Time   09/20/12 11:30 AM      Component Value Range Comment   Glucose-Capillary 147 (*) 70 - 99 mg/dL    Comment 1 Notify RN     HEMOGLOBIN AND HEMATOCRIT, BLOOD  Status: Abnormal   Collection Time   09/20/12  4:14 PM      Component Value Range Comment   Hemoglobin 9.1 (*) 12.0 - 15.0 g/dL    HCT 29.0 (*) 36.0 - 46.0 %   BASIC METABOLIC PANEL     Status: Abnormal   Collection Time   09/20/12  4:14 PM      Component Value Range Comment   Sodium 137  135 - 145 mEq/L    Potassium 4.3  3.5 - 5.1 mEq/L    Chloride 102  96 - 112 mEq/L    CO2 23  19 - 32 mEq/L    Glucose, Bld 220 (*) 70 - 99 mg/dL    BUN 16  6 - 23 mg/dL    Creatinine, Ser 1.02  0.50 - 1.10 mg/dL    Calcium 8.8  8.4 - 10.5 mg/dL    GFR calc non Af Amer 56 (*) >90 mL/min    GFR calc Af Amer 65 (*) >90 mL/min   GLUCOSE, CAPILLARY     Status:  Abnormal   Collection Time   09/20/12  4:28 PM      Component Value Range Comment   Glucose-Capillary 220 (*) 70 - 99 mg/dL    Comment 1 Notify RN       Disposition:  Follow-up Information    Follow up with CROITORU,MIHAI, MD. On 09/30/2012. (11:45)    Contact information:   [address] [place] [address] [phone]       Follow up with [name], MD. (call office)    Contact information:   [id number] [place] [address] [phone]          Discharge Medications:    Medication List     As of 09/21/2012 10:33 AM    STOP taking these medications         insulin glargine 100 UNIT/ML injection   Commonly known as: LANTUS      PRESCRIPTION MEDICATION      TAKE these medications         acetaminophen 500 MG tablet   Commonly known as: TYLENOL   Take 1,000 mg by mouth every 6 (six) hours as needed. For pain      albuterol 108 (90 BASE) MCG/ACT inhaler   Commonly known as: PROVENTIL HFA;VENTOLIN HFA   Inhale 2 puffs into the lungs every 6 (six) hours as needed. For shortness of breath      aspirin EC 81 MG tablet   Take 81 mg by mouth daily.      CENTRUM SILVER PO   Take 1 tablet by mouth daily.      furosemide 20 MG tablet   Commonly known as: LASIX   Take 1 tablet (20 mg total) by mouth daily.      hydroxychloroquine 200 MG tablet   Commonly known as: PLAQUENIL   Take 200 mg by mouth 2 (two) times daily.      lisinopril 5 MG tablet   Commonly known as: PRINIVIL,ZESTRIL   Take 1 tablet (5 mg total) by mouth daily.      metoprolol 50 MG tablet   Commonly known as: LOPRESSOR   Take 1 tablet (50 mg total) by mouth 2 (two) times daily.      predniSONE 5 MG tablet   Commonly known as: DELTASONE   Take 5 mg by mouth daily.      Rivaroxaban 20 MG Tabs  Commonly known as: XARELTO   Take 1 tablet (20 mg total) by mouth daily.          Duration of Discharge Encounter: Greater than 30  minutes including physician time.  Angelena Form PA-C 09/21/2012 10:33 AM

## 2012-09-21 NOTE — Discharge Summary (Signed)
I saw & examined the patient on the day of discharge.  She has been stable from a cardiac standpoint, with normalized renal function & Hgb post transfusion.  She underwent successful DCCV with no further Afib, dyspnea improved.  Endoscopy today - mild gastric erosions & small colon polyps.  Ok to continue Xarelto. BP still elevated -- have added & increased BB as well as ACE-I.  Labs reviewed.  OK for discharge.  ROV as described.  Leonie Man, M.D., M.S. [facility] [address] [place], [place]  [address]  [phone] Pager # [phone] 09/21/2012 1:57 PM

## 2013-03-28 ENCOUNTER — Other Ambulatory Visit: Payer: Self-pay | Admitting: Cardiovascular Disease

## 2013-03-28 LAB — CBC
Hemoglobin: 10.6 g/dL — ABNORMAL LOW (ref 12.0–15.0)
MCH: 28 pg (ref 26.0–34.0)
MCHC: 33.7 g/dL (ref 30.0–36.0)
Platelets: 263 10*3/uL (ref 150–400)
RBC: 3.79 MIL/uL — ABNORMAL LOW (ref 3.87–5.11)

## 2013-04-11 ENCOUNTER — Telehealth: Payer: Self-pay | Admitting: *Deleted

## 2013-04-11 ENCOUNTER — Other Ambulatory Visit (HOSPITAL_COMMUNITY): Payer: Self-pay | Admitting: Internal Medicine

## 2013-04-11 DIAGNOSIS — Z0000 Encounter for general adult medical examination without abnormal findings: Secondary | ICD-10-CM

## 2013-04-11 NOTE — Telephone Encounter (Signed)
Message copied by Raiford Simmonds on Mon Apr 11, 2013  7:02 PM ------      Message from: [place], [place]      Created: Sun Apr 10, 2013  6:56 PM       Anemia better, but not resolved ------

## 2013-04-11 NOTE — Telephone Encounter (Signed)
Spoke to patient results given. Continue taking iron tabs.

## 2013-04-14 ENCOUNTER — Ambulatory Visit (HOSPITAL_COMMUNITY): Payer: Medicare Other

## 2013-04-18 ENCOUNTER — Other Ambulatory Visit: Payer: Self-pay | Admitting: *Deleted

## 2013-04-18 ENCOUNTER — Ambulatory Visit (HOSPITAL_COMMUNITY)
Admission: RE | Admit: 2013-04-18 | Discharge: 2013-04-18 | Disposition: A | Discharge status: Home / Self Care | Payer: Medicare Other | Source: Ambulatory Visit | Attending: Internal Medicine | Admitting: Internal Medicine

## 2013-04-18 DIAGNOSIS — Z1231 Encounter for screening mammogram for malignant neoplasm of breast: Secondary | ICD-10-CM | POA: Insufficient documentation

## 2013-04-18 DIAGNOSIS — Z0000 Encounter for general adult medical examination without abnormal findings: Secondary | ICD-10-CM

## 2013-04-18 MED ORDER — METOPROLOL TARTRATE 50 MG PO TABS: 50.0000 mg | ORAL_TABLET | Freq: Two times a day (BID) | ORAL | Status: DC

## 2013-04-18 NOTE — Telephone Encounter (Signed)
Rx was sent to pharmacy electronically. 

## 2013-04-21 ENCOUNTER — Other Ambulatory Visit (HOSPITAL_COMMUNITY): Payer: Self-pay | Admitting: Family Medicine

## 2013-04-21 ENCOUNTER — Ambulatory Visit (HOSPITAL_COMMUNITY)
Admission: RE | Admit: 2013-04-21 | Discharge: 2013-04-21 | Disposition: A | Discharge status: Home / Self Care | Payer: Medicare Other | Source: Ambulatory Visit | Attending: Family Medicine | Admitting: Family Medicine

## 2013-04-21 DIAGNOSIS — E119 Type 2 diabetes mellitus without complications: Secondary | ICD-10-CM | POA: Insufficient documentation

## 2013-04-21 DIAGNOSIS — M7730 Calcaneal spur, unspecified foot: Secondary | ICD-10-CM | POA: Insufficient documentation

## 2013-04-21 DIAGNOSIS — M79609 Pain in unspecified limb: Secondary | ICD-10-CM | POA: Insufficient documentation

## 2013-04-21 DIAGNOSIS — M25572 Pain in left ankle and joints of left foot: Secondary | ICD-10-CM

## 2013-08-17 ENCOUNTER — Telehealth (HOSPITAL_COMMUNITY): Payer: Self-pay | Admitting: *Deleted

## 2013-08-22 ENCOUNTER — Ambulatory Visit (INDEPENDENT_AMBULATORY_CARE_PROVIDER_SITE_OTHER): Payer: Medicare Other | Admitting: Cardiology

## 2013-08-22 ENCOUNTER — Encounter: Payer: Self-pay | Admitting: Cardiology

## 2013-08-22 VITALS — BP 146/58 | HR 75 | Ht 65.0 in | Wt 233.8 lb

## 2013-08-22 DIAGNOSIS — R0602 Shortness of breath: Secondary | ICD-10-CM | POA: Insufficient documentation

## 2013-08-22 DIAGNOSIS — I359 Nonrheumatic aortic valve disorder, unspecified: Secondary | ICD-10-CM

## 2013-08-22 DIAGNOSIS — I10 Essential (primary) hypertension: Secondary | ICD-10-CM

## 2013-08-22 DIAGNOSIS — I350 Nonrheumatic aortic (valve) stenosis: Secondary | ICD-10-CM

## 2013-08-22 DIAGNOSIS — I4892 Unspecified atrial flutter: Secondary | ICD-10-CM

## 2013-08-22 NOTE — Progress Notes (Signed)
Clinical Summary Ms. Hartline is a 67 y.o.female referred to establish cardiology followup. She is a former patient of [facility], available records were reviewed. Does report a history of dyspnea on exertion and leg edema, progressive over the last several months. No chest pain or palpitations however. Her Lasix had been increased, although she avoids taking it more than once a day due to increased urinary frequency. Her ECG today shows normal sinus rhythm with nonspecific ST-T changes. At some point she has come off anticoagulation following her prior cardioversion, now on aspirin daily.  Echocardiogram in December 2013 revealed moderate LVH with LVEF 55-60%, increased filling pressures, mild aortic stenosis with mild to moderate aortic regurgitation, MAC with mild mitral regurgitation, PASP 54 mmHg, trivial pericardial effusion.  Previous Lexiscan Myoview from March 2011 revealed LVEF of 57% with no ischemic perfusion defects.   No Known Allergies  Current Outpatient Prescriptions  Medication Sig Dispense Refill  . acetaminophen (TYLENOL) 500 MG tablet Take 1,000 mg by mouth every 6 (six) hours as needed. For pain      . albuterol (PROVENTIL HFA;VENTOLIN HFA) 108 (90 BASE) MCG/ACT inhaler Inhale 2 puffs into the lungs every 6 (six) hours as needed. For shortness of breath      . aspirin EC 81 MG tablet Take 81 mg by mouth daily.      . folic acid (FOLVITE) 1 MG tablet Take 1 mg by mouth daily.      . furosemide (LASIX) 20 MG tablet Take 40 mg by mouth 3 (three) times daily as needed.      . hydroxychloroquine (PLAQUENIL) 200 MG tablet Take 200 mg by mouth 2 (two) times daily.      Marland Kitchen losartan-hydrochlorothiazide (HYZAAR) 100-25 MG per tablet Take 1 tablet by mouth daily.      . meloxicam (MOBIC) 7.5 MG tablet Take 7.5 mg by mouth 2 (two) times daily.      . metFORMIN (GLUCOPHAGE) 500 MG tablet Take by mouth 2 (two) times daily with a meal.      . metoprolol (LOPRESSOR) 50 MG tablet Take 1  tablet (50 mg total) by mouth 2 (two) times daily.  60 tablet  11  . Multiple Vitamins-Minerals (CENTRUM SILVER PO) Take 1 tablet by mouth daily.      . simvastatin (ZOCOR) 80 MG tablet Take 80 mg by mouth daily.      . Vitamin D, Ergocalciferol, (DRISDOL) 50000 UNITS CAPS capsule Take 50,000 Units by mouth every 7 (seven) days.       No current facility-administered medications for this visit.    Past Medical History  Diagnosis Date  . Asthma   . Essential hypertension, benign   . Type 2 diabetes mellitus   . SLE (systemic lupus erythematosus)   . Anemia   . Atrial flutter     TEE/DCCV December 2013 - on Xarelto [facility])  . CKD (chronic kidney disease) stage 3, GFR 30-59 ml/min     Past Surgical History  Procedure Laterality Date  . Ankle surgery  1993  . Back surgery  1980  . Abdominal hysterectomy  1983  . Tee without cardioversion  09/16/2012    Procedure: TRANSESOPHAGEAL ECHOCARDIOGRAM (TEE);  Surgeon: Sanda Klein, MD;  Location: [place];  Service: Cardiovascular;  Laterality: N/A;  . Cardioversion  09/16/2012    Procedure: CARDIOVERSION;  Surgeon: Sanda Klein, MD;  Location: [facility] ENDOSCOPY;  Service: Cardiovascular;  Laterality: N/A;  . Esophagogastroduodenoscopy  09/20/2012    Procedure: ESOPHAGOGASTRODUODENOSCOPY (EGD);  Surgeon: Cleotis Nipper, MD;  Location: [facility] ENDOSCOPY;  Service: Endoscopy;  Laterality: Left;  . Colonoscopy  09/20/2012    Procedure: COLONOSCOPY;  Surgeon: Cleotis Nipper, MD;  Location: [facility] ENDOSCOPY;  Service: Endoscopy;  Laterality: Left;    Family History  Problem Relation Age of Onset  . Diabetes Sister   . Diabetes Brother   . Diabetes Father   . Hypertension Sister   . Hypertension Brother   . Hypertension Father   . Stroke Mother     Social History Ms. Beatie reports that she has quit smoking. Her smoking use included Cigarettes. She smoked 0.10 packs per day. She does not have any smokeless tobacco history on file. Ms.  Freeberg reports that she does not drink alcohol.  Review of Systems No orthopnea or PND. No reported bleeding problems. No speech deficits or motor weakness. Otherwise negative.  Physical Examination Filed Vitals:   08/22/13 1446  BP: 146/58  Pulse: 75   Filed Weights   08/22/13 1446  Weight: 233 lb 12 oz (106.028 kg)   Obese woman, appears comfortable at rest. HEENT: Conjunctiva and lids normal, oropharynx clear. Neck: Supple, no elevated JVP or carotid bruits, no thyromegaly. Lungs: Clear to auscultation, nonlabored breathing at rest. Cardiac: Regular rate and rhythm, no S3, 2/6 systolic murmur, no pericardial rub. Abdomen: Soft, nontender, bowel sounds present, no guarding or rebound. Extremities: 1-2+ edema, distal pulses 2+. Skin: Warm and dry. Musculoskeletal: No kyphosis. Neuropsychiatric: Alert and oriented x3, affect grossly appropriate.   Problem List and Plan   Atrial flutter Prior history of atrial flutter status post TEE cardioversion in December 2013 [facility]), previously on Xarelto. Not certain when this dropped off her list. She is now on aspirin. I don't have complete records. She has had no obvious recurrences, and is in sinus rhythm now. She tells me that at least at this point she does not want to resume.  Aortic stenosis Mild by echocardiogram last year. Doubt that this explains dyspnea although she has associated diastolic dysfunction and moderate secondary pulmonary hypertension. Followup echocardiogram will be obtained.  Shortness of breath As noted above, would plan to followup echocardiogram. Also increase Lasix to 60 mg once daily which is more than she has been doing recently. Followup arranged.  Essential hypertension, benign Blood pressure mildly elevated today. Keep followup with. Encarnacion Chu, M.D., F.A.C.C.

## 2013-08-22 NOTE — Assessment & Plan Note (Addendum)
Prior history of atrial flutter status post TEE cardioversion in December 2013 [facility]), previously on Xarelto. Not certain when this dropped off her list. She is now on aspirin. I don't have complete records. She has had no obvious recurrences, and is in sinus rhythm now. She tells me that at least at this point she does not want to resume.

## 2013-08-22 NOTE — Patient Instructions (Addendum)
Your physician wants you to follow-up in: 6 months You will receive a reminder letter in the mail two months in advance. If you don't receive a letter, please call our office to schedule the follow-up appointment.     Your physician has recommended you make the following change in your medication:    Take three -  20 mg  Lasix tablets (60 mg) every morning    Your physician has requested that you have an echocardiogram. Echocardiography is a painless test that uses sound waves to create images of your heart. It provides your doctor with information about the size and shape of your heart and how well your heart's chambers and valves are working. This procedure takes approximately one hour. There are no restrictions for this procedure.

## 2013-08-22 NOTE — Assessment & Plan Note (Signed)
Blood pressure mildly elevated today. Keep followup with. Fusco.

## 2013-08-22 NOTE — Assessment & Plan Note (Signed)
Mild by echocardiogram last year. Doubt that this explains dyspnea although she has associated diastolic dysfunction and moderate secondary pulmonary hypertension. Followup echocardiogram will be obtained.

## 2013-08-22 NOTE — Assessment & Plan Note (Signed)
As noted above, would plan to followup echocardiogram. Also increase Lasix to 60 mg once daily which is more than she has been doing recently. Followup arranged.

## 2013-08-31 ENCOUNTER — Ambulatory Visit (HOSPITAL_COMMUNITY): Admission: RE | Admit: 2013-08-31 | Payer: Medicare Other | Source: Ambulatory Visit

## 2013-09-05 ENCOUNTER — Other Ambulatory Visit (HOSPITAL_COMMUNITY): Payer: Self-pay | Admitting: Family Medicine

## 2013-09-05 DIAGNOSIS — Z9889 Other specified postprocedural states: Secondary | ICD-10-CM

## 2013-09-05 DIAGNOSIS — G589 Mononeuropathy, unspecified: Secondary | ICD-10-CM

## 2013-09-05 DIAGNOSIS — M545 Low back pain: Secondary | ICD-10-CM

## 2013-09-05 DIAGNOSIS — G5792 Unspecified mononeuropathy of left lower limb: Secondary | ICD-10-CM

## 2013-09-07 ENCOUNTER — Ambulatory Visit (HOSPITAL_COMMUNITY)
Admission: RE | Admit: 2013-09-07 | Discharge: 2013-09-07 | Disposition: A | Discharge status: Home / Self Care | Payer: Medicare Other | Source: Ambulatory Visit | Attending: Cardiology | Admitting: Cardiology

## 2013-09-07 DIAGNOSIS — N183 Chronic kidney disease, stage 3 unspecified: Secondary | ICD-10-CM | POA: Insufficient documentation

## 2013-09-07 DIAGNOSIS — I350 Nonrheumatic aortic (valve) stenosis: Secondary | ICD-10-CM

## 2013-09-07 DIAGNOSIS — E119 Type 2 diabetes mellitus without complications: Secondary | ICD-10-CM | POA: Insufficient documentation

## 2013-09-07 DIAGNOSIS — R0609 Other forms of dyspnea: Secondary | ICD-10-CM | POA: Insufficient documentation

## 2013-09-07 DIAGNOSIS — J4489 Other specified chronic obstructive pulmonary disease: Secondary | ICD-10-CM | POA: Insufficient documentation

## 2013-09-07 DIAGNOSIS — Z6838 Body mass index (BMI) 38.0-38.9, adult: Secondary | ICD-10-CM | POA: Insufficient documentation

## 2013-09-07 DIAGNOSIS — I359 Nonrheumatic aortic valve disorder, unspecified: Secondary | ICD-10-CM

## 2013-09-07 DIAGNOSIS — R0602 Shortness of breath: Secondary | ICD-10-CM | POA: Insufficient documentation

## 2013-09-07 DIAGNOSIS — R0989 Other specified symptoms and signs involving the circulatory and respiratory systems: Secondary | ICD-10-CM | POA: Insufficient documentation

## 2013-09-07 DIAGNOSIS — J449 Chronic obstructive pulmonary disease, unspecified: Secondary | ICD-10-CM | POA: Insufficient documentation

## 2013-09-07 DIAGNOSIS — E669 Obesity, unspecified: Secondary | ICD-10-CM | POA: Insufficient documentation

## 2013-09-07 DIAGNOSIS — I4892 Unspecified atrial flutter: Secondary | ICD-10-CM | POA: Insufficient documentation

## 2013-09-07 DIAGNOSIS — I129 Hypertensive chronic kidney disease with stage 1 through stage 4 chronic kidney disease, or unspecified chronic kidney disease: Secondary | ICD-10-CM | POA: Insufficient documentation

## 2013-09-07 NOTE — Progress Notes (Signed)
*  PRELIMINARY RESULTS* Echocardiogram 2D Echocardiogram has been performed.  [place], [place] 09/07/2013, 1:38 PM

## 2013-09-08 ENCOUNTER — Ambulatory Visit (HOSPITAL_COMMUNITY)
Admission: RE | Admit: 2013-09-08 | Discharge: 2013-09-08 | Disposition: A | Discharge status: Home / Self Care | Payer: Medicare Other | Source: Ambulatory Visit | Attending: Family Medicine | Admitting: Family Medicine

## 2013-09-08 ENCOUNTER — Encounter (HOSPITAL_COMMUNITY): Payer: Self-pay

## 2013-09-08 DIAGNOSIS — G589 Mononeuropathy, unspecified: Secondary | ICD-10-CM

## 2013-09-08 DIAGNOSIS — M545 Low back pain, unspecified: Secondary | ICD-10-CM | POA: Insufficient documentation

## 2013-09-08 DIAGNOSIS — IMO0002 Reserved for concepts with insufficient information to code with codable children: Secondary | ICD-10-CM | POA: Insufficient documentation

## 2013-09-08 DIAGNOSIS — G5792 Unspecified mononeuropathy of left lower limb: Secondary | ICD-10-CM

## 2013-09-08 DIAGNOSIS — M5126 Other intervertebral disc displacement, lumbar region: Secondary | ICD-10-CM | POA: Insufficient documentation

## 2013-09-08 DIAGNOSIS — R209 Unspecified disturbances of skin sensation: Secondary | ICD-10-CM | POA: Insufficient documentation

## 2013-09-08 DIAGNOSIS — M5380 Other specified dorsopathies, site unspecified: Secondary | ICD-10-CM | POA: Insufficient documentation

## 2013-09-08 DIAGNOSIS — Z9889 Other specified postprocedural states: Secondary | ICD-10-CM

## 2013-09-08 LAB — POCT I-STAT CREATININE: Creatinine, Ser: 1.4 mg/dL — ABNORMAL HIGH (ref 0.50–1.10)

## 2013-09-08 MED ORDER — GADOBENATE DIMEGLUMINE 529 MG/ML IV SOLN
20.0000 mL | Freq: Once | INTRAVENOUS | Status: AC | PRN
  Administered 2013-09-08: 20 mL via INTRAVENOUS

## 2014-02-15 ENCOUNTER — Encounter: Payer: Self-pay | Admitting: Physician Assistant

## 2014-02-15 ENCOUNTER — Ambulatory Visit (INDEPENDENT_AMBULATORY_CARE_PROVIDER_SITE_OTHER): Payer: Medicare Other | Admitting: Physician Assistant

## 2014-02-15 VITALS — BP 162/82 | HR 78 | Ht 65.0 in | Wt 211.0 lb

## 2014-02-15 DIAGNOSIS — I4892 Unspecified atrial flutter: Secondary | ICD-10-CM

## 2014-02-15 DIAGNOSIS — I359 Nonrheumatic aortic valve disorder, unspecified: Secondary | ICD-10-CM

## 2014-02-15 DIAGNOSIS — R0989 Other specified symptoms and signs involving the circulatory and respiratory systems: Secondary | ICD-10-CM

## 2014-02-15 DIAGNOSIS — I10 Essential (primary) hypertension: Secondary | ICD-10-CM

## 2014-02-15 DIAGNOSIS — I350 Nonrheumatic aortic (valve) stenosis: Secondary | ICD-10-CM

## 2014-02-15 DIAGNOSIS — I4891 Unspecified atrial fibrillation: Secondary | ICD-10-CM

## 2014-02-15 DIAGNOSIS — R0602 Shortness of breath: Secondary | ICD-10-CM

## 2014-02-15 NOTE — Patient Instructions (Addendum)
Your physician recommends that you schedule a follow-up appointment in: 2 months with Dr.McDowell   Your physician recommends that you continue on your current medications as directed. Please refer to the Current Medication list given to you today.  PLEASE TAKE YOUR BLOOD PRESSURE MEDICINE AS SOON AS YO GET HOME  Your physician has requested that you have a carotid duplex. This test is an ultrasound of the carotid arteries in your neck. It looks at blood flow through these arteries that supply the brain with blood. Allow one hour for this exam. There are no restrictions or special instructions.    Please follow low sodium guideline pamphlet I have provided you     Thank you for choosing [facility] !

## 2014-02-15 NOTE — Assessment & Plan Note (Signed)
Check carotid Dopplers °

## 2014-02-15 NOTE — Progress Notes (Signed)
HPI: This is a 68 year old female patient of Dr. Domenic Polite who has history of atrial flutter status post TEE cardioversion in December 2013([facility]), previously on Xarelto but this was discontinued. She also has history of mild aortic stenosis by echo last year as well as diastolic dysfunction and moderate secondary pulmonary hypertension. She also has hypertension. Dr. Domenic Polite had ordered a repeat echo in December 2014 which LV function was stable with diastolic dysfunction and overall mild pulmonary hypertension, better than previously noted there remains moderate AI and mild MR.  Patient says she's been short of breath lately but thinks it's due to her anemia. She is seeing a hematologist tomorrow. She says she has occasional palpitations maybe once every for 5 months. She has not had any in a long time. Occasionally she has fleeting chest pain that is described as sharp shooting. She denies any chest heaviness, dizziness or presyncope.  Patient forgot to take her blood pressure medicine this morning and her blood pressure is elevated. She says it's been controlled at home and she watches her salt closely.  No Known Allergies  Current Outpatient Prescriptions on File Prior to Visit: acetaminophen (TYLENOL) 500 MG tablet, Take 1,000 mg by mouth every 6 (six) hours as needed. For pain, Disp: , Rfl:  albuterol (PROVENTIL HFA;VENTOLIN HFA) 108 (90 BASE) MCG/ACT inhaler, Inhale 2 puffs into the lungs every 6 (six) hours as needed. For shortness of breath, Disp: , Rfl:  aspirin EC 81 MG tablet, Take 81 mg by mouth daily., Disp: , Rfl:  folic acid (FOLVITE) 1 MG tablet, Take 1 mg by mouth daily., Disp: , Rfl:  furosemide (LASIX) 20 MG tablet, Take 40 mg by mouth 3 (three) times daily as needed., Disp: , Rfl:  hydroxychloroquine (PLAQUENIL) 200 MG tablet, Take 200 mg by mouth 2 (two) times daily., Disp: , Rfl:  losartan-hydrochlorothiazide (HYZAAR) 100-25 MG per tablet, Take 1 tablet by mouth daily., Disp: ,  Rfl:  meloxicam (MOBIC) 7.5 MG tablet, Take 7.5 mg by mouth 2 (two) times daily., Disp: , Rfl:  metFORMIN (GLUCOPHAGE) 500 MG tablet, Take by mouth 2 (two) times daily with a meal., Disp: , Rfl:  metoprolol (LOPRESSOR) 50 MG tablet, Take 1 tablet (50 mg total) by mouth 2 (two) times daily., Disp: 60 tablet, Rfl: 11 Multiple Vitamins-Minerals (CENTRUM SILVER PO), Take 1 tablet by mouth daily., Disp: , Rfl:  simvastatin (ZOCOR) 80 MG tablet, Take 80 mg by mouth daily., Disp: , Rfl:  Vitamin D, Ergocalciferol, (DRISDOL) 50000 UNITS CAPS capsule, Take 50,000 Units by mouth every 7 (seven) days., Disp: , Rfl:   No current facility-administered medications on file prior to visit.   Past Medical History:   Asthma                                                       Essential hypertension, benign                               Type 2 diabetes mellitus                                     SLE (systemic lupus erythematosus)  Anemia                                                       Atrial flutter                                                 Comment:TEE/DCCV December 2013 - on Xarelto [facility])   CKD (chronic kidney disease) stage 3, GFR 30-5*             Past Surgical History:   [place]         TEE WITHOUT CARDIOVERSION                        09/16/2012     Comment:Procedure: TRANSESOPHAGEAL ECHOCARDIOGRAM               (TEE);  Surgeon: Sanda Klein, MD;  Location:              [facility] ENDOSCOPY;  Service: Cardiovascular;                Laterality: N/A;   CARDIOVERSION                                    09/16/2012     Comment:Procedure: CARDIOVERSION;  Surgeon: Sanda Klein, MD;  Location: [facility] ENDOSCOPY;                Service: Cardiovascular;  Laterality: N/A;    ESOPHAGOGASTRODUODENOSCOPY                       09/20/2012     Comment:Procedure: ESOPHAGOGASTRODUODENOSCOPY (EGD);                Surgeon: Cleotis Nipper, MD;  Location: [facility]               ENDOSCOPY;  Service: Endoscopy;  Laterality:               Left;   COLONOSCOPY                                      09/20/2012     Comment:Procedure: COLONOSCOPY;  Surgeon: Youlanda Mighty  Buccini, MD;  Location: [facility] ENDOSCOPY;  Service:              Endoscopy;  Laterality: Left;  Review of patient's family history indicates:   Diabetes                       Sister                   Diabetes                       Brother                  Diabetes                       Father                   Hypertension                   Sister                   Hypertension                   Brother                  Hypertension                   Father                   Stroke                         Mother                   Social History   Marital Status: Married             Spouse Name:                      Years of Education:                 Number of children:             Occupational History   None on file  Social History Main Topics   Smoking Status: Former Smoker                   Packs/Day: 0.10  Years:           Types: Cigarettes   Smokeless Status: Not on file                      Comment: quit 08-2012   Alcohol Use: No             Drug Use: No             Sexual Activity: Yes                Other Topics            Concern   None on file  Social History Narrative   None on file    ROS: See history of present illness otherwise negative   PHYSICAL EXAM: Obese, in no acute distress. Neck: Bilateral carotid bruits left greater than right, No JVD, HJR,  or thyroid enlargement  Lungs: Decreased breath sounds but No tachypnea, clear without wheezing, rales,  or rhonchi  Cardiovascular: RRR, PMI not displaced, positive S4 and 2/6 systolic murmur at the left sternal border, no bruit,  thrill, or heave.  Abdomen: BS normal. Soft without organomegaly, masses, lesions or tenderness.  Extremities: +1 edema bilaterally without cyanosis, clubbing . Good distal pulses bilateral  SKin: Warm, no lesions or rashes   Musculoskeletal: No deformities  Neuro: no focal signs  BP 162/82  Pulse 78  Ht 5\' 5"  (1.651 m)  Wt 211 lb (95.709 kg)  BMI 35.11 kg/m2    EKG: Normal sinus rhythm with T wave inversion inferior laterally unchanged from prior tracing   2Decho 09/07/13: - Left ventricle: The cavity size was normal. Wall thickness   was increased in a pattern of moderate LVH. Systolic   function was normal. The estimated ejection fraction was   in the range of 55% to 60%. Wall motion was normal; there   were no regional wall motion abnormalities. Features are   consistent with a pseudonormal left ventricular filling   pattern, with concomitant abnormal relaxation and   increased filling pressure (grade 2 diastolic   dysfunction). Doppler parameters are consistent with both   elevated ventricular end-diastolic filling pressure and   elevated left atrial filling pressure. - Aortic valve: No significant degree of stenosis seen.   Mildly thickened, mildly calcified leaflets. Cusp   separation was mildly reduced. Moderate regurgitation.   Mean gradient: 36mm Hg (S). - Mitral valve: Calcified annulus. Moderately thickened   leaflets . Mild regurgitation. - Left atrium: The atrium was moderately dilated. - Right ventricle: Systolic function was mildly reduced. RV   systolic pressure: [id number] Hg (S, est). This is suggestive of   mild pulmonary arterial hypertension.    2Decho 09/07/13:

## 2014-02-15 NOTE — Assessment & Plan Note (Signed)
Blood pressure is elevated today. She did not take her medications. She will take the medicine she gets home. 2 g sodium diet.

## 2014-02-15 NOTE — Assessment & Plan Note (Signed)
2-D echo was stable in December 2014. Followup with Dr. Domenic Polite

## 2014-02-15 NOTE — Assessment & Plan Note (Signed)
Maintaining normal sinus rhythm 

## 2014-02-15 NOTE — Assessment & Plan Note (Signed)
Patient was recently told she is anemic and is scheduled to see a hematologist tomorrow.

## 2014-02-16 ENCOUNTER — Encounter (HOSPITAL_COMMUNITY): Payer: Medicare Other

## 2014-02-16 ENCOUNTER — Encounter (HOSPITAL_COMMUNITY): Payer: Medicare Other | Attending: Hematology and Oncology

## 2014-02-16 ENCOUNTER — Encounter (HOSPITAL_COMMUNITY): Payer: Self-pay

## 2014-02-16 VITALS — BP 150/31 | HR 80 | Temp 97.9°F | Resp 24 | Wt 210.9 lb

## 2014-02-16 DIAGNOSIS — I359 Nonrheumatic aortic valve disorder, unspecified: Secondary | ICD-10-CM | POA: Insufficient documentation

## 2014-02-16 DIAGNOSIS — I129 Hypertensive chronic kidney disease with stage 1 through stage 4 chronic kidney disease, or unspecified chronic kidney disease: Secondary | ICD-10-CM | POA: Insufficient documentation

## 2014-02-16 DIAGNOSIS — N183 Chronic kidney disease, stage 3 unspecified: Secondary | ICD-10-CM | POA: Insufficient documentation

## 2014-02-16 DIAGNOSIS — J449 Chronic obstructive pulmonary disease, unspecified: Secondary | ICD-10-CM

## 2014-02-16 DIAGNOSIS — M329 Systemic lupus erythematosus, unspecified: Secondary | ICD-10-CM | POA: Insufficient documentation

## 2014-02-16 DIAGNOSIS — J4489 Other specified chronic obstructive pulmonary disease: Secondary | ICD-10-CM | POA: Insufficient documentation

## 2014-02-16 DIAGNOSIS — D649 Anemia, unspecified: Secondary | ICD-10-CM | POA: Insufficient documentation

## 2014-02-16 DIAGNOSIS — E119 Type 2 diabetes mellitus without complications: Secondary | ICD-10-CM

## 2014-02-16 DIAGNOSIS — I10 Essential (primary) hypertension: Secondary | ICD-10-CM

## 2014-02-16 DIAGNOSIS — R630 Anorexia: Secondary | ICD-10-CM | POA: Insufficient documentation

## 2014-02-16 DIAGNOSIS — R634 Abnormal weight loss: Secondary | ICD-10-CM | POA: Insufficient documentation

## 2014-02-16 DIAGNOSIS — I5030 Unspecified diastolic (congestive) heart failure: Secondary | ICD-10-CM | POA: Insufficient documentation

## 2014-02-16 LAB — COMPREHENSIVE METABOLIC PANEL
ALK PHOS: 72 U/L (ref 39–117)
ALT: 28 U/L (ref 0–35)
AST: 32 U/L (ref 0–37)
Albumin: 2.8 g/dL — ABNORMAL LOW (ref 3.5–5.2)
BUN: 18 mg/dL (ref 6–23)
CHLORIDE: 107 meq/L (ref 96–112)
CO2: 21 mEq/L (ref 19–32)
Calcium: 8.5 mg/dL (ref 8.4–10.5)
Creatinine, Ser: 1.2 mg/dL — ABNORMAL HIGH (ref 0.50–1.10)
GFR calc Af Amer: 53 mL/min — ABNORMAL LOW (ref >90)
GFR, EST NON AFRICAN AMERICAN: 45 mL/min — AB (ref >90)
Glucose, Bld: 177 mg/dL — ABNORMAL HIGH (ref 70–99)
POTASSIUM: 3.7 meq/L (ref 3.7–5.3)
Sodium: 141 mEq/L (ref 137–147)
Total Bilirubin: 0.3 mg/dL (ref 0.3–1.2)
Total Protein: 7.2 g/dL (ref 6.0–8.3)

## 2014-02-16 LAB — CBC WITH DIFFERENTIAL/PLATELET
Basophils Absolute: 0 10*3/uL (ref 0.0–0.1)
Basophils Relative: 0 % (ref 0–1)
EOS ABS: 0.1 10*3/uL (ref 0.0–0.7)
EOS PCT: 3 % (ref 0–5)
HEMATOCRIT: 23.2 % — AB (ref 36.0–46.0)
Hemoglobin: 7.3 g/dL — ABNORMAL LOW (ref 12.0–15.0)
LYMPHS PCT: 17 % (ref 12–46)
Lymphs Abs: 0.8 10*3/uL (ref 0.7–4.0)
MCH: 27.8 pg (ref 26.0–34.0)
MCHC: 31.5 g/dL (ref 30.0–36.0)
MCV: 88.2 fL (ref 78.0–100.0)
MONO ABS: 0.1 10*3/uL (ref 0.1–1.0)
Monocytes Relative: 1 % — ABNORMAL LOW (ref 3–12)
Neutro Abs: 3.6 10*3/uL (ref 1.7–7.7)
Neutrophils Relative %: 79 % — ABNORMAL HIGH (ref 43–77)
Platelets: 264 10*3/uL (ref 150–400)
RBC: 2.63 MIL/uL — AB (ref 3.87–5.11)
RDW: 17.3 % — ABNORMAL HIGH (ref 11.5–15.5)
WBC: 4.6 10*3/uL (ref 4.0–10.5)

## 2014-02-16 LAB — RETICULOCYTES
RBC.: 2.63 MIL/uL — ABNORMAL LOW (ref 3.87–5.11)
Retic Count, Absolute: 63.1 10*3/uL (ref 19.0–186.0)
Retic Ct Pct: 2.4 % (ref 0.4–3.1)

## 2014-02-16 LAB — LACTATE DEHYDROGENASE: LDH: 253 U/L — AB (ref 94–250)

## 2014-02-16 NOTE — Patient Instructions (Signed)
Door Discharge Instructions  RECOMMENDATIONS MADE BY THE CONSULTANT AND ANY TEST RESULTS WILL BE SENT TO YOUR REFERRING PHYSICIAN.  EXAM FINDINGS BY THE PHYSICIAN TODAY AND SIGNS OR SYMPTOMS TO REPORT TO CLINIC OR PRIMARY PHYSICIAN:   We drew a lot of labs on you today. We should have the results back tomorrow. We will call you and let you know what Dr. Barnet Glasgow plans on doing to you regarding your low blood.   We will have you come in next week to get treatment for your low blood.   Return in 3 weeks for blood work and to see Dr. Barnet Glasgow  Wednesday June 10 @ 1:30   Thank you for choosing [facility] to provide your oncology and hematology care.  To afford each patient quality time with our providers, please arrive at least 15 minutes before your scheduled appointment time.  With your help, our goal is to use those 15 minutes to complete the necessary work-up to ensure our physicians have the information they need to help with your evaluation and healthcare recommendations.    Effective January 1st, 2014, we ask that you re-schedule your appointment with our physicians should you arrive 10 or more minutes late for your appointment.  We strive to give you quality time with our providers, and arriving late affects you and other patients whose appointments are after yours.    Again, thank you for choosing [facility].  Our hope is that these requests will decrease the amount of time that you wait before being seen by our physicians.       _____________________________________________________________  Should you have questions after your visit to [facility], please contact our office at [phone] between the hours of 8:30 a.m. and 5:00 p.m.  Voicemails left after 4:30 p.m. will not be returned until the following business day.  For prescription refill requests, have your pharmacy contact our office with your prescription  refill request.

## 2014-02-16 NOTE — Progress Notes (Signed)
[place] A. Barnet Glasgow, M.D.  NEW PATIENT EVALUATION   Name: [name] Date: 02/16/2014 MRN: [id number] DOB: 1946/03/22  PCP: [name], MD   REFERRING PHYSICIAN: Halford Chessman, MD                                              Leigh Aurora, MD  REASON FOR REFERRAL: Anemia     HISTORY OF PRESENT ILLNESS:[name] is a 68 y.o. female who is referred by her family physician for evaluation of anemia. She is a known case of systemic lupus erythematosus currently on treatment with hydroxychloroquine, meloxicam, and vitamin D supplements. She also takes Lasix 40 mg 3 times daily for edema along with folic acid 1 mg daily. Over the past 3-4 months she's been progressively weaker with decreased appetite and weight loss. As a result her knees feel better. She denies any skin rash, headache, or change in visual acuity. Bowel movement are regular with some diarrhea, constipation, melena, hematochezia, hematuria, vaginal bleeding, epistaxis, or hemoptysis. She denies any PND, orthopnea, or palpitations but has experienced increased lower 70 swelling.   PAST MEDICAL HISTORY:  has a past medical history of Asthma; Essential hypertension, benign; Type 2 diabetes mellitus; SLE (systemic lupus erythematosus); Anemia; Atrial flutter; and CKD (chronic kidney disease) stage 3, GFR 30-59 ml/min.     PAST SURGICAL HISTORY: Past Surgical History  Procedure Laterality Date  . Ankle surgery  1993  . Back surgery  1980  . Abdominal hysterectomy  1983  . Tee without cardioversion  09/16/2012    Procedure: TRANSESOPHAGEAL ECHOCARDIOGRAM (TEE);  Surgeon: Sanda Klein, MD;  Location: [facility];  Service: Cardiovascular;  Laterality: N/A;  . Cardioversion  09/16/2012    Procedure: CARDIOVERSION;  Surgeon: Sanda Klein, MD;  Location: [facility] ENDOSCOPY;  Service: Cardiovascular;  Laterality: N/A;  . Esophagogastroduodenoscopy   09/20/2012    Procedure: ESOPHAGOGASTRODUODENOSCOPY (EGD);  Surgeon: Cleotis Nipper, MD;  Location: [facility] ENDOSCOPY;  Service: Endoscopy;  Laterality: Left;  . Colonoscopy  09/20/2012    Procedure: COLONOSCOPY;  Surgeon: Cleotis Nipper, MD;  Location: [facility] ENDOSCOPY;  Service: Endoscopy;  Laterality: Left;     CURRENT MEDICATIONS: has a current medication list which includes the following prescription(s): acetaminophen, albuterol, aspirin ec, folic acid, furosemide, hydroxychloroquine, losartan-hydrochlorothiazide, meloxicam, metformin, metoprolol, multiple vitamins-minerals, simvastatin, and vitamin d (ergocalciferol).   ALLERGIES: Review of patient's allergies indicates no known allergies.   SOCIAL HISTORY:  reports that she has quit smoking. Her smoking use included Cigarettes. She has a 30 pack-year smoking history. She does not have any smokeless tobacco history on file. She reports that she does not drink alcohol or use illicit drugs.   FAMILY HISTORY: family history includes Diabetes in her brother, father, and sister; Hypertension in her brother, father, and sister; Stroke in her mother.    REVIEW OF SYSTEMS:  Other than that discussed above is noncontributory.    PHYSICAL EXAM:  weight is 210 lb 14.4 oz (95.664 kg). Her oral temperature is 97.9 F (36.6 C). Her blood pressure is 150/31 and her pulse is 80. Her respiration is 24.    GENERAL:alert, no distress and comfortable. Moderately obese. Uses a cane.  SKIN: skin color, texture, turgor are normal, no rashes or significant lesions EYES: normal, Conjunctiva are pink  and non-injected, sclera clear OROPHARYNX:no exudate, no erythema and lips, buccal mucosa, and tongue normal  NECK: supple, thyroid normal size, non-tender, without nodularity CHEST:  increased AP diameter with no breast masses. LYMPH:  no palpable lymphadenopathy in the cervical, axillary or inguinal LUNGS: clear to auscultation and percussion with normal  breathing effort HEART: regular rate & rhythm.grade [id number] ejection systolic murmur at the aortic focus nonradiating. No diastolic murmur are normal.  ABDOMEN:abdomen soft, non-tender and normal bowel sounds. Liver spleen on large.  MUSCULOSKELETALl:no cyanosis of digits, no clubbing or edema  NEURO: alert & oriented x 3 with fluent speech, no focal motor/sensory deficits    LABORATORY DATA:  On 02/08/2014: WBC 5.5, hemoglobin 7.7, platelets 185,000 with MCV of 90.6.    Office Visit on 02/16/2014  Component Date Value Ref Range Status  . WBC 02/16/2014 4.6  4.0 - 10.5 K/uL Final  . RBC 02/16/2014 2.63* 3.87 - 5.11 MIL/uL Final  . Hemoglobin 02/16/2014 7.3* 12.0 - 15.0 g/dL Final  . HCT 02/16/2014 23.2* 36.0 - 46.0 % Final  . MCV 02/16/2014 88.2  78.0 - 100.0 fL Final  . MCH 02/16/2014 27.8  26.0 - 34.0 pg Final  . MCHC 02/16/2014 31.5  30.0 - 36.0 g/dL Final  . RDW 02/16/2014 17.3* 11.5 - 15.5 % Final  . Platelets 02/16/2014 264  150 - 400 K/uL Final  . Neutrophils Relative % 02/16/2014 79* 43 - 77 % Final  . Neutro Abs 02/16/2014 3.6  1.7 - 7.7 K/uL Final  . Lymphocytes Relative 02/16/2014 17  12 - 46 % Final  . Lymphs Abs 02/16/2014 0.8  0.7 - 4.0 K/uL Final  . Monocytes Relative 02/16/2014 1* 3 - 12 % Final  . Monocytes Absolute 02/16/2014 0.1  0.1 - 1.0 K/uL Final  . Eosinophils Relative 02/16/2014 3  0 - 5 % Final  . Eosinophils Absolute 02/16/2014 0.1  0.0 - 0.7 K/uL Final  . Basophils Relative 02/16/2014 0  0 - 1 % Final  . Basophils Absolute 02/16/2014 0.0  0.0 - 0.1 K/uL Final  . Retic Ct Pct 02/16/2014 2.4  0.4 - 3.1 % Final  . RBC. 02/16/2014 2.63* 3.87 - 5.11 MIL/uL Final  . Retic Count, Manual 02/16/2014 63.1  19.0 - 186.0 K/uL Final    Urinalysis    Component Value Date/Time   COLORURINE YELLOW 09/15/2012 0132   APPEARANCEUR HAZY* 09/15/2012 0132   LABSPEC >1.030* 09/15/2012 0132   PHURINE 5.5 09/15/2012 0132   GLUCOSEU NEGATIVE 09/15/2012 0132   HGBUR  NEGATIVE 09/15/2012 0132   BILIRUBINUR NEGATIVE 09/15/2012 0132   KETONESUR NEGATIVE 09/15/2012 0132   PROTEINUR 30* 09/15/2012 0132   UROBILINOGEN 0.2 09/15/2012 0132   NITRITE NEGATIVE 09/15/2012 0132   LEUKOCYTESUR SMALL* 09/15/2012 0132      @RADIOGRAPHY :   09/07/2013: Echocardiogram with ejection fraction of 55-60%. Mild aortic stenosis with diastolic dysfunction and moderate secondary pulmonary hypertension. She had suffered with atrial flutter status postTEE cardioversion December 2013([facility])  MR Lumbar Spine W Wo Contrast Status: Final result         PACS Images    Show images for MR Lumbar Spine W Wo Contrast         Study Result    CLINICAL DATA: Low back pain with left lower extremity  radiculopathy in the hip and leg. Left leg numbness and weakness.  Lumbago.  EXAM:  MRI LUMBAR SPINE WITHOUT AND WITH CONTRAST  TECHNIQUE:  Multiplanar and multiecho pulse sequences of the lumbar  spine were  obtained without and with intravenous contrast.  CONTRAST: 60mL MULTIHANCE GADOBENATE DIMEGLUMINE 529 MG/ML IV SOLN  COMPARISON: CT scan abdomen and pelvis dated 01/11/2004  FINDINGS:  Normal conus tip at L1. Normal paraspinal soft tissues except for  postsurgical changes at L5-S1 on the right.  There is a slightly mottled appearance of the bone marrow which is  nonspecific. This can be seen with obesity and smoking.  T11-12 through L1-2: No significant abnormality.  L2-3: Disc space narrowing with a small broad-based disc bulge with  a disc extrusion into the left neural foramen which compresses the  left L2 nerve in the neural foramen, best seen on images 16- 17 of  series 5, 6 and 9.  L3-4: Normal.  L4-5: Small broad-based disc bulge with no neural impingement.  L5-S1: Disc space narrowing. Small disc protrusion with accompanying  osteophytes into the right lateral recess immediately adjacent to  the right S1 nerve root sleeve but with but without compression.    Prior right hemilaminectomy. Minimal enhancing scarring around the  right S1 nerve root sleeve.  After contrast administration the only pathologic enhancement is  around the left facet joint at L2-3 and around the extruded disc  fragment in the left neural foramen at L2-3.  IMPRESSION:  1. Extruded disc fragment into the left neural foramen at L2-3  compressing the left L2 nerve.  2. Left facet arthritis at L2-3.  3. Focal small disc protrusion into the right lateral recess at  L5-S1 without neural impingement.  Electronically Signed  By: Rozetta Nunnery M.D.  On: 12/     PATHOLOGY: peripheral smear failed to reveal evidence of premature forms, basophilic stippling, or hypersegmented neutrophils.   IMPRESSION:  #1. Symptomatic normocytic normochromic anemia possibly due to underlying systemic lupus or Placquenil, occult blood loss, or anemia in conjunction with chronic kidney disease. #2. Systemic lupus erythematosus, on treatment. #3. Diastolic heart failure with mild aortic stenosis with ejection fraction of 55-60%. #4. Hypertension, controlled. #5. Diabetes mellitus, type II, non-insulin requiring, controlled. #6. Chronic obstructive pulmonary disease.   PLAN:  #1. Ferritin, iron, TIBC, Coombs test, B12 and folate level, SPEP, erythropoietin level, and haptoglobin pending. #2. Specific therapy will be recommended depending upon the findings of the above. Patient was told to call should symptoms become worse so that transfusion could be offered.  I appreciate the opportunity of sharing in her care.   Farrel Gobble, MD 02/16/2014 2:37 PM   DISCLAIMER:  This note was dictated with voice recognition softwre.  Similar sounding words can inadvertently be transcribed inaccurately and may not be corrected upon review.

## 2014-02-16 NOTE — Progress Notes (Signed)
[name] presented for labwork. Labs per MD order drawn via Peripheral Line 23 gauge needle inserted in rt ac Good blood return present. Procedure without incident.  Needle removed intact. Patient tolerated procedure well.

## 2014-02-17 ENCOUNTER — Other Ambulatory Visit (HOSPITAL_COMMUNITY): Payer: Self-pay | Admitting: Hematology and Oncology

## 2014-02-17 DIAGNOSIS — E538 Deficiency of other specified B group vitamins: Secondary | ICD-10-CM | POA: Insufficient documentation

## 2014-02-17 LAB — FERRITIN: Ferritin: 194 ng/mL (ref 10–291)

## 2014-02-17 LAB — FOLATE: Folate: 4.2 ng/mL

## 2014-02-17 LAB — DIRECT ANTIGLOBULIN TEST (NOT AT ARMC)
DAT, COMPLEMENT: NEGATIVE
DAT, IgG: NEGATIVE

## 2014-02-17 LAB — VITAMIN B12: Vitamin B-12: 504 pg/mL (ref 211–911)

## 2014-02-21 ENCOUNTER — Ambulatory Visit (HOSPITAL_COMMUNITY)
Admission: RE | Admit: 2014-02-21 | Discharge: 2014-02-21 | Disposition: A | Discharge status: Home / Self Care | Payer: Medicare Other | Source: Ambulatory Visit | Attending: Physician Assistant | Admitting: Physician Assistant

## 2014-02-21 DIAGNOSIS — I10 Essential (primary) hypertension: Secondary | ICD-10-CM | POA: Insufficient documentation

## 2014-02-21 DIAGNOSIS — E785 Hyperlipidemia, unspecified: Secondary | ICD-10-CM | POA: Insufficient documentation

## 2014-02-21 DIAGNOSIS — R0989 Other specified symptoms and signs involving the circulatory and respiratory systems: Secondary | ICD-10-CM | POA: Insufficient documentation

## 2014-02-21 DIAGNOSIS — E119 Type 2 diabetes mellitus without complications: Secondary | ICD-10-CM | POA: Insufficient documentation

## 2014-02-21 LAB — PROTEIN ELECTROPHORESIS, SERUM
ALBUMIN ELP: 47.6 % — AB (ref 55.8–66.1)
ALPHA-2-GLOBULIN: 12.1 % — AB (ref 7.1–11.8)
Alpha-1-Globulin: 7.9 % — ABNORMAL HIGH (ref 2.9–4.9)
Beta 2: 5.8 % (ref 3.2–6.5)
Beta Globulin: 5.2 % (ref 4.7–7.2)
Gamma Globulin: 21.4 % — ABNORMAL HIGH (ref 11.1–18.8)
M-SPIKE, %: NOT DETECTED g/dL
TOTAL PROTEIN ELP: 6.5 g/dL (ref 6.0–8.3)

## 2014-02-21 LAB — ERYTHROPOIETIN: Erythropoietin: 54.9 m[IU]/mL — ABNORMAL HIGH (ref 2.6–18.5)

## 2014-02-23 ENCOUNTER — Telehealth (HOSPITAL_COMMUNITY): Payer: Self-pay

## 2014-02-23 ENCOUNTER — Other Ambulatory Visit (HOSPITAL_COMMUNITY): Payer: Self-pay | Admitting: Hematology and Oncology

## 2014-02-23 DIAGNOSIS — D631 Anemia in chronic kidney disease: Secondary | ICD-10-CM | POA: Insufficient documentation

## 2014-02-23 DIAGNOSIS — N184 Chronic kidney disease, stage 4 (severe): Secondary | ICD-10-CM

## 2014-02-23 DIAGNOSIS — N189 Chronic kidney disease, unspecified: Principal | ICD-10-CM

## 2014-02-23 NOTE — Telephone Encounter (Signed)
Patient notified and verbalized understanding of instructions. 

## 2014-02-23 NOTE — Telephone Encounter (Signed)
Call from patient wanting to know lab results and if anything needs to be done at this time.  Can be reached at [phone].

## 2014-02-23 NOTE — Telephone Encounter (Signed)
Message copied by Mellissa Kohut on Thu Feb 23, 2014  4:35 PM ------      Message from: [place], [place]: Thu Feb 23, 2014  3:39 PM       Have order Procrit 40,000 units subcutaneously  weekly for 4 weeks to begin on 03/08/2014. Diagnosis is anemia of chronic disease in renal failure. ------

## 2014-02-24 NOTE — Progress Notes (Signed)
Labs drawn

## 2014-03-08 ENCOUNTER — Encounter (HOSPITAL_COMMUNITY): Payer: Self-pay

## 2014-03-08 ENCOUNTER — Encounter (HOSPITAL_COMMUNITY): Payer: Medicare Other | Attending: Hematology and Oncology

## 2014-03-08 ENCOUNTER — Encounter (HOSPITAL_BASED_OUTPATIENT_CLINIC_OR_DEPARTMENT_OTHER): Payer: Medicare Other

## 2014-03-08 ENCOUNTER — Encounter (HOSPITAL_COMMUNITY): Payer: Medicare Other

## 2014-03-08 VITALS — BP 166/57 | HR 96 | Temp 98.6°F | Resp 20 | Wt 205.8 lb

## 2014-03-08 DIAGNOSIS — N189 Chronic kidney disease, unspecified: Secondary | ICD-10-CM | POA: Insufficient documentation

## 2014-03-08 DIAGNOSIS — E119 Type 2 diabetes mellitus without complications: Secondary | ICD-10-CM

## 2014-03-08 DIAGNOSIS — D649 Anemia, unspecified: Secondary | ICD-10-CM | POA: Insufficient documentation

## 2014-03-08 DIAGNOSIS — I10 Essential (primary) hypertension: Secondary | ICD-10-CM

## 2014-03-08 DIAGNOSIS — D631 Anemia in chronic kidney disease: Secondary | ICD-10-CM

## 2014-03-08 DIAGNOSIS — J449 Chronic obstructive pulmonary disease, unspecified: Secondary | ICD-10-CM

## 2014-03-08 DIAGNOSIS — M329 Systemic lupus erythematosus, unspecified: Secondary | ICD-10-CM

## 2014-03-08 DIAGNOSIS — J4489 Other specified chronic obstructive pulmonary disease: Secondary | ICD-10-CM | POA: Insufficient documentation

## 2014-03-08 DIAGNOSIS — N039 Chronic nephritic syndrome with unspecified morphologic changes: Principal | ICD-10-CM

## 2014-03-08 LAB — CBC WITH DIFFERENTIAL/PLATELET
Basophils Absolute: 0 10*3/uL (ref 0.0–0.1)
Basophils Relative: 0 % (ref 0–1)
EOS PCT: 0 % (ref 0–5)
Eosinophils Absolute: 0 10*3/uL (ref 0.0–0.7)
HCT: 24 % — ABNORMAL LOW (ref 36.0–46.0)
Hemoglobin: 8 g/dL — ABNORMAL LOW (ref 12.0–15.0)
LYMPHS ABS: 0.4 10*3/uL — AB (ref 0.7–4.0)
LYMPHS PCT: 13 % (ref 12–46)
MCH: 28.6 pg (ref 26.0–34.0)
MCHC: 33.3 g/dL (ref 30.0–36.0)
MCV: 85.7 fL (ref 78.0–100.0)
Monocytes Absolute: 0.1 10*3/uL (ref 0.1–1.0)
Monocytes Relative: 2 % — ABNORMAL LOW (ref 3–12)
NEUTROS PCT: 85 % — AB (ref 43–77)
Neutro Abs: 2.6 10*3/uL (ref 1.7–7.7)
Platelets: 168 10*3/uL (ref 150–400)
RBC: 2.8 MIL/uL — AB (ref 3.87–5.11)
RDW: 16.4 % — ABNORMAL HIGH (ref 11.5–15.5)
WBC: 3 10*3/uL — AB (ref 4.0–10.5)

## 2014-03-08 MED ORDER — EPOETIN ALFA 40000 UNIT/ML IJ SOLN
40000.0000 [IU] | Freq: Once | INTRAMUSCULAR | Status: AC
  Administered 2014-03-08: 40000 [IU] via SUBCUTANEOUS

## 2014-03-08 MED ORDER — EPOETIN ALFA 40000 UNIT/ML IJ SOLN
INTRAMUSCULAR | Status: AC
  Filled 2014-03-08: qty 1

## 2014-03-08 NOTE — Progress Notes (Signed)
[name]  OFFICE PROGRESS NOTE  [name], MD [address] [place] [address]  DIAGNOSIS: Anemia in chronic renal disease - Plan: CBC with Differential, CBC, Ferritin  Chronic obstructive pulmonary disease  Systemic lupus erythematosus  Chief Complaint  Patient presents with  . Anemia in chronic kidney disease    CURRENT THERAPY: Anemia workup completed.  INTERVAL HISTORY: [name] 68 y.o. female returns for followup of anemia workup for recommendations regarding management. She continues on hydroxychloroquine, meloxicam, and vitamin D supplements for systemic lupus erythematosus while also taking folic acid and Lasix.  She continues to be short of breath on mild exertion. Joint pain is controlled. She denies any fever, night sweats, diarrhea, constipation, melena, hematochezia, hematuria, vaginal bleeding, epistaxis, or hemoptysis. She denies any peripheral paresthesias, skin rash, headache, or seizures.  MEDICAL HISTORY: Past Medical History  Diagnosis Date  . Asthma   . Essential hypertension, benign   . Type 2 diabetes mellitus   . SLE (systemic lupus erythematosus)   . Anemia   . Atrial flutter     TEE/DCCV December 2013 - on Xarelto [facility])  . CKD (chronic kidney disease) stage 3, GFR 30-59 ml/min     INTERIM HISTORY: has Atrial flutter; Morbid obesity; Type 2 diabetes mellitus with diabetic neuropathy; SLE (systemic lupus erythematosus); Essential hypertension, benign; COPD (chronic obstructive pulmonary disease); CKD (chronic kidney disease) stage 3, GFR 30-59 ml/min; Aortic regurgitation; Shortness of breath; Carotid bruit; Folate deficiency; and Anemia in chronic renal disease on her problem list.    ALLERGIES:  has No Known Allergies.  MEDICATIONS: has a current medication list which includes the following prescription(s): acetaminophen, albuterol, aspirin ec, folic acid, furosemide,  hydroxychloroquine, losartan-hydrochlorothiazide, meloxicam, metformin, methotrexate, metoprolol, multiple vitamins-minerals, prednisone, saxagliptin hcl, simvastatin, and vitamin d (ergocalciferol).  SURGICAL HISTORY:  Past Surgical History  Procedure Laterality Date  . Ankle surgery  1993  . Back surgery  1980  . Abdominal hysterectomy  1983  . Tee without cardioversion  09/16/2012    Procedure: TRANSESOPHAGEAL ECHOCARDIOGRAM (TEE);  Surgeon: Sanda Klein, MD;  Location: [facility];  Service: Cardiovascular;  Laterality: N/A;  . Cardioversion  09/16/2012    Procedure: CARDIOVERSION;  Surgeon: Sanda Klein, MD;  Location[facility] ENDOSCOPY;  Service: Cardiovascular;  Laterality: N/A;  . Esophagogastroduodenoscopy  09/20/2012    Procedure: ESOPHAGOGASTRODUODENOSCOPY (EGD);  Surgeon: Cleotis Nipper, MD;  Location: [facility] ENDOSCOPY;  Service: Endoscopy;  Laterality: Left;  . Colonoscopy  09/20/2012    Procedure: COLONOSCOPY;  Surgeon: Cleotis Nipper, MD;  Location: [facility] ENDOSCOPY;  Service: Endoscopy;  Laterality: Left;    FAMILY HISTORY: family history includes Diabetes in her brother, father, and sister; Hypertension in her brother, father, and sister; Stroke in her mother.  SOCIAL HISTORY:  reports that she has quit smoking. Her smoking use included Cigarettes. She has a 30 pack-year smoking history. She does not have any smokeless tobacco history on file. She reports that she does not drink alcohol or use illicit drugs.  REVIEW OF SYSTEMS:  Other than that discussed above is noncontributory.  PHYSICAL EXAMINATION: ECOG PERFORMANCE STATUS: 1 - Symptomatic but completely ambulatory  Blood pressure 166/57, pulse 96, temperature 98.6 F (37 C), temperature source Oral, resp. rate 20, weight 205 lb 12.8 oz (93.35 kg).  GENERAL:alert, no distress and comfortable. Moderately obese, using a cane. SKIN: skin color, texture, turgor are normal, no rashes or significant lesions EYES: PERLA;  Conjunctiva are  pink and non-injected, sclera clear SINUSES: No redness or tenderness over maxillary or ethmoid sinuses OROPHARYNX:no exudate, no erythema on lips, buccal mucosa, or tongue. NECK: supple, thyroid normal size, non-tender, without nodularity. No masses CHEST: Normal AP diameter with no breast masses. LYMPH:  no palpable lymphadenopathy in the cervical, axillary or inguinal LUNGS: clear to auscultation and percussion with normal breathing effort HEART: regular rate & rhythm grade 2-4/5 ejection systolic murmur at the aortic focus, nonradiating. No diastolic murmur.. ABDOMEN:abdomen soft, non-tender and normal bowel sounds MUSCULOSKELETAL:no cyanosis of digits and no clubbing. Range of motion normal.  NEURO: alert & oriented x 3 with fluent speech, no focal motor/sensory deficits   LABORATORY DATA: Infusion on 03/08/2014  Component Date Value Ref Range Status  . WBC 03/08/2014 3.0* 4.0 - 10.5 K/uL Final  . RBC 03/08/2014 2.80* 3.87 - 5.11 MIL/uL Final  . Hemoglobin 03/08/2014 8.0* 12.0 - 15.0 g/dL Final  . HCT 03/08/2014 24.0* 36.0 - 46.0 % Final  . MCV 03/08/2014 85.7  78.0 - 100.0 fL Final  . MCH 03/08/2014 28.6  26.0 - 34.0 pg Final  . MCHC 03/08/2014 33.3  30.0 - 36.0 g/dL Final  . RDW 03/08/2014 16.4* 11.5 - 15.5 % Final  . Platelets 03/08/2014 168  150 - 400 K/uL Final  . Neutrophils Relative % 03/08/2014 85* 43 - 77 % Final  . Neutro Abs 03/08/2014 2.6  1.7 - 7.7 K/uL Final  . Lymphocytes Relative 03/08/2014 13  12 - 46 % Final  . Lymphs Abs 03/08/2014 0.4* 0.7 - 4.0 K/uL Final  . Monocytes Relative 03/08/2014 2* 3 - 12 % Final  . Monocytes Absolute 03/08/2014 0.1  0.1 - 1.0 K/uL Final  . Eosinophils Relative 03/08/2014 0  0 - 5 % Final  . Eosinophils Absolute 03/08/2014 0.0  0.0 - 0.7 K/uL Final  . Basophils Relative 03/08/2014 0  0 - 1 % Final  . Basophils Absolute 03/08/2014 0.0  0.0 - 0.1 K/uL Final  Office Visit on 02/16/2014  Component Date Value Ref Range  Status  . WBC 02/16/2014 4.6  4.0 - 10.5 K/uL Final  . RBC 02/16/2014 2.63* 3.87 - 5.11 MIL/uL Final  . Hemoglobin 02/16/2014 7.3* 12.0 - 15.0 g/dL Final  . HCT 02/16/2014 23.2* 36.0 - 46.0 % Final  . MCV 02/16/2014 88.2  78.0 - 100.0 fL Final  . MCH 02/16/2014 27.8  26.0 - 34.0 pg Final  . MCHC 02/16/2014 31.5  30.0 - 36.0 g/dL Final  . RDW 02/16/2014 17.3* 11.5 - 15.5 % Final  . Platelets 02/16/2014 264  150 - 400 K/uL Final  . Neutrophils Relative % 02/16/2014 79* 43 - 77 % Final  . Neutro Abs 02/16/2014 3.6  1.7 - 7.7 K/uL Final  . Lymphocytes Relative 02/16/2014 17  12 - 46 % Final  . Lymphs Abs 02/16/2014 0.8  0.7 - 4.0 K/uL Final  . Monocytes Relative 02/16/2014 1* 3 - 12 % Final  . Monocytes Absolute 02/16/2014 0.1  0.1 - 1.0 K/uL Final  . Eosinophils Relative 02/16/2014 3  0 - 5 % Final  . Eosinophils Absolute 02/16/2014 0.1  0.0 - 0.7 K/uL Final  . Basophils Relative 02/16/2014 0  0 - 1 % Final  . Basophils Absolute 02/16/2014 0.0  0.0 - 0.1 K/uL Final  . Retic Ct Pct 02/16/2014 2.4  0.4 - 3.1 % Final  . RBC. 02/16/2014 2.63* 3.87 - 5.11 MIL/uL Final  . Retic Count, Manual 02/16/2014 63.1  19.0 -  186.0 K/uL Final  . LDH 02/16/2014 253* 94 - 250 U/L Final  . Sodium 02/16/2014 141  137 - 147 mEq/L Final  . Potassium 02/16/2014 3.7  3.7 - 5.3 mEq/L Final  . Chloride 02/16/2014 107  96 - 112 mEq/L Final  . CO2 02/16/2014 21  19 - 32 mEq/L Final  . Glucose, Bld 02/16/2014 177* 70 - 99 mg/dL Final  . BUN 02/16/2014 18  6 - 23 mg/dL Final  . Creatinine, Ser 02/16/2014 1.20* 0.50 - 1.10 mg/dL Final  . Calcium 02/16/2014 8.5  8.4 - 10.5 mg/dL Final  . Total Protein 02/16/2014 7.2  6.0 - 8.3 g/dL Final  . Albumin 02/16/2014 2.8* 3.5 - 5.2 g/dL Final  . AST 02/16/2014 32  0 - 37 U/L Final  . ALT 02/16/2014 28  0 - 35 U/L Final  . Alkaline Phosphatase 02/16/2014 72  39 - 117 U/L Final  . Total Bilirubin 02/16/2014 0.3  0.3 - 1.2 mg/dL Final  . GFR calc non Af Amer 02/16/2014 45*  >90 mL/min Final  . GFR calc Af Amer 02/16/2014 53* >90 mL/min Final   Comment: (NOTE)                          The eGFR has been calculated using the CKD EPI equation.                          This calculation has not been validated in all clinical situations.                          eGFR's persistently <90 mL/min signify possible Chronic Kidney                          Disease.  . Ferritin 02/16/2014 194  10 - 291 ng/mL Final   Performed at [facility]  . Erythropoietin 02/16/2014 54.9* 2.6 - 18.5 mIU/mL Final   Performed at [facility]  . DAT, complement 02/16/2014 NEG   Final  . DAT, IgG 02/16/2014 NEG   Final  . Folate 02/16/2014 4.2   Final   Comment: (NOTE)                          Reference Ranges                                 Deficient:       0.4 - 3.3 ng/mL                                 Indeterminate:   3.4 - 5.4 ng/mL                                 Normal:              > 5.4 ng/mL                          Performed at [facility]  . Vitamin B-12 02/16/2014 504  211 - 911 pg/mL Final   Performed at [facility]  .  Total Protein ELP 02/16/2014 6.5  6.0 - 8.3 g/dL Final  . Albumin ELP 02/16/2014 47.6* 55.8 - 66.1 % Final  . Alpha-1-Globulin 02/16/2014 7.9* 2.9 - 4.9 % Final  . Alpha-2-Globulin 02/16/2014 12.1* 7.1 - 11.8 % Final  . Beta Globulin 02/16/2014 5.2  4.7 - 7.2 % Final  . Beta 2 02/16/2014 5.8  3.2 - 6.5 % Final  . Gamma Globulin 02/16/2014 21.4* 11.1 - 18.8 % Final  . M-Spike, % 02/16/2014 NOT DETECTED   Final  . SPE Interp. 02/16/2014 (NOTE)   Final   Comment: Nonspecific diffuse polyclonal type increase in gamma globulins.                          Reviewed by Francis Gaines Mammarappallil MD (Electronic Signature on File)  . Comment 02/16/2014 (NOTE)   Final   Comment: ---------------                          Serum protein electrophoresis is a useful screening procedure in the                          detection of various  pathophysiologic states such as inflammation,                          gammopathies, protein loss and other dysproteinemias.  Immunofixation                          electrophoresis (IFE) is a more sensitive technique for the                          identification of M-proteins found in patients with monoclonal                          gammopathy of unknown significance (MGUS), amyloidosis, early or                          treated myeloma or macroglobulinemia, solitary plasmacytoma or                          extramedullary plasmacytoma.                          Performed at [place]: None.  Urinalysis    Component Value Date/Time   COLORURINE YELLOW 09/15/2012 0132   APPEARANCEUR HAZY* 09/15/2012 0132   LABSPEC >1.030* 09/15/2012 0132   PHURINE 5.5 09/15/2012 0132   GLUCOSEU NEGATIVE 09/15/2012 0132   HGBUR NEGATIVE 09/15/2012 0132   BILIRUBINUR NEGATIVE 09/15/2012 0132   KETONESUR NEGATIVE 09/15/2012 0132   PROTEINUR 30* 09/15/2012 0132   UROBILINOGEN 0.2 09/15/2012 0132   NITRITE NEGATIVE 09/15/2012 0132   LEUKOCYTESUR SMALL* 09/15/2012 0132    RADIOGRAPHIC STUDIES: US Carotid Duplex Bilateral  02/21/2014   CLINICAL DATA:  Carotid bruit, history of hypertension, hyperlipidemia, diabetes  EXAM: BILATERAL CAROTID DUPLEX ULTRASOUND  TECHNIQUE: Pearline Cables scale imaging, color Doppler and duplex ultrasound were performed of bilateral carotid and vertebral arteries in the neck.  COMPARISON:  None.  FINDINGS: Criteria: Quantification of carotid stenosis is based on velocity parameters that correlate the residual  internal carotid diameter with NASCET-based stenosis levels, using the diameter of the distal internal carotid lumen as the denominator for stenosis measurement.  The following velocity measurements were obtained:  RIGHT  ICA:  96/17 cm/sec  CCA:  39/7 cm/sec  SYSTOLIC ICA/CCA RATIO:  6.73  DIASTOLIC ICA/CCA RATIO:  2.3  ECA:  80 cm/sec  LEFT  ICA:  85/20  cm/sec  CCA:  41/93 cm/sec  SYSTOLIC ICA/CCA RATIO:  0.9  DIASTOLIC ICA/CCA RATIO:  1.4  ECA:  77 cm/sec  RIGHT CAROTID ARTERY: There is a minimal amount of eccentric mixed echogenic plaque within the mid and distal aspects of the right common carotid artery (images 6, 7, 10 and 11). There is a minimal amount of eccentric mixed echogenic plaque within the right carotid bulb and (images 16), not resulting in elevated peak systolic velocities within the interrogated course of the right internal carotid artery to suggest a hemodynamically significant stenosis.  RIGHT VERTEBRAL ARTERY:  Antegrade flow  LEFT CAROTID ARTERY: There is a minimal amount of eccentric mixed echogenic plaque within the mid and distal aspects of the left common carotid artery (images 41, 42, 45 and 46). There is a very minimal amount of eccentric mixed echogenic partially shadowing plaque with left carotid bulb (image 51) extending to involve the origin and proximal aspect of the left internal carotid artery (image 59), not resulting in elevated peak systolic velocities within the interrogated course of the left internal carotid artery to suggest a hemodynamically significant stenosis.  LEFT VERTEBRAL ARTERY:  Antegrade flow  IMPRESSION: Minimal amount of bilateral atherosclerotic plaque, not resulting in a hemodynamically significant stenosis.   Electronically Signed   By: Sandi Mariscal M.D.   On: 02/21/2014 10:39    ASSESSMENT:  #1. Anemia of chronic disease in association with systemic lupus erythematosus, its treatment, and chronic kidney disease. #2. Systemic lupus erythematosus, on treatment with hydroxychloroquine. #3. Diastolic heart failure with mild aortic stenosis. #4. Hypertension, controlled. #5. Diabetes mellitus, type II, non-insulin regarding, controlled. #6. Chronic obstructive pulmonary disease.   PLAN:  #1. Procrit 20,000 units subcutaneously weekly until hemoglobin reaches near 11. #2. Followup in 4 weeks with CBC  and ferritin.   All questions were answered. The patient knows to call the clinic with any problems, questions or concerns. We can certainly see the patient much sooner if necessary.   I spent 25 minutes counseling the patient face to face. The total time spent in the appointment was 30 minutes.    Doroteo Bradford, MD 03/08/2014 2:43 PM  DISCLAIMER:  This note was dictated with voice recognition software.  Similar sounding words can inadvertently be transcribed inaccurately and may not be corrected upon review.

## 2014-03-08 NOTE — Progress Notes (Signed)
Labs drawn for cbc/diff.

## 2014-03-08 NOTE — Patient Instructions (Signed)
[facility] Discharge Instructions  RECOMMENDATIONS MADE BY THE CONSULTANT AND ANY TEST RESULTS WILL BE SENT TO YOUR REFERRING PHYSICIAN.  EXAM FINDINGS BY THE PHYSICIAN TODAY AND SIGNS OR SYMPTOMS TO REPORT TO CLINIC OR PRIMARY PHYSICIAN: You saw Dr Barnet Glasgow today  Return weekely for blood work and possible procrit injection.  Followup in 4 weeks with the doctor  Thank you for choosing [facility] to provide your oncology and hematology care.  To afford each patient quality time with our providers, please arrive at least 15 minutes before your scheduled appointment time.  With your help, our goal is to use those 15 minutes to complete the necessary work-up to ensure our physicians have the information they need to help with your evaluation and healthcare recommendations.    Effective January 1st, 2014, we ask that you re-schedule your appointment with our physicians should you arrive 10 or more minutes late for your appointment.  We strive to give you quality time with our providers, and arriving late affects you and other patients whose appointments are after yours.    Again, thank you for choosing [facility].  Our hope is that these requests will decrease the amount of time that you wait before being seen by our physicians.       _____________________________________________________________  Should you have questions after your visit to [facility], please contact our office at [phone] between the hours of 8:30 a.m. and 5:00 p.m.  Voicemails left after 4:30 p.m. will not be returned until the following business day.  For prescription refill requests, have your pharmacy contact our office with your prescription refill request.

## 2014-03-08 NOTE — Progress Notes (Signed)
[name]'s reason for visit today is for an injection and labs as scheduled per MD orders.  Labs were drawn prior to administration of ordered medication.  [name] also received procrit 40,000 units per MD orders; see [facility] for administration details.  [name] tolerated all procedures well and without incident; questions were answered and patient was discharged.

## 2014-03-10 ENCOUNTER — Other Ambulatory Visit (HOSPITAL_COMMUNITY): Payer: Self-pay

## 2014-03-10 DIAGNOSIS — N183 Chronic kidney disease, stage 3 unspecified: Secondary | ICD-10-CM

## 2014-03-10 DIAGNOSIS — N189 Chronic kidney disease, unspecified: Principal | ICD-10-CM

## 2014-03-10 DIAGNOSIS — D631 Anemia in chronic kidney disease: Secondary | ICD-10-CM

## 2014-03-13 ENCOUNTER — Emergency Department (HOSPITAL_COMMUNITY): Payer: Medicare Other

## 2014-03-13 ENCOUNTER — Encounter (HOSPITAL_COMMUNITY): Payer: Self-pay | Admitting: Emergency Medicine

## 2014-03-13 ENCOUNTER — Inpatient Hospital Stay (HOSPITAL_COMMUNITY)
Admission: EM | Admit: 2014-03-13 | Discharge: 2014-03-28 | DRG: 291 | Disposition: A | Discharge status: Home / Self Care | Payer: Medicare Other | Attending: Internal Medicine | Admitting: Internal Medicine

## 2014-03-13 DIAGNOSIS — E785 Hyperlipidemia, unspecified: Secondary | ICD-10-CM | POA: Diagnosis present

## 2014-03-13 DIAGNOSIS — Z87891 Personal history of nicotine dependence: Secondary | ICD-10-CM

## 2014-03-13 DIAGNOSIS — I5033 Acute on chronic diastolic (congestive) heart failure: Principal | ICD-10-CM | POA: Diagnosis present

## 2014-03-13 DIAGNOSIS — Z6834 Body mass index (BMI) 34.0-34.9, adult: Secondary | ICD-10-CM

## 2014-03-13 DIAGNOSIS — M329 Systemic lupus erythematosus, unspecified: Secondary | ICD-10-CM | POA: Diagnosis present

## 2014-03-13 DIAGNOSIS — I4892 Unspecified atrial flutter: Secondary | ICD-10-CM

## 2014-03-13 DIAGNOSIS — R0600 Dyspnea, unspecified: Secondary | ICD-10-CM

## 2014-03-13 DIAGNOSIS — N184 Chronic kidney disease, stage 4 (severe): Secondary | ICD-10-CM

## 2014-03-13 DIAGNOSIS — N039 Chronic nephritic syndrome with unspecified morphologic changes: Secondary | ICD-10-CM

## 2014-03-13 DIAGNOSIS — J9600 Acute respiratory failure, unspecified whether with hypoxia or hypercapnia: Secondary | ICD-10-CM | POA: Diagnosis not present

## 2014-03-13 DIAGNOSIS — R509 Fever, unspecified: Secondary | ICD-10-CM | POA: Diagnosis not present

## 2014-03-13 DIAGNOSIS — I5032 Chronic diastolic (congestive) heart failure: Secondary | ICD-10-CM | POA: Diagnosis present

## 2014-03-13 DIAGNOSIS — M949 Disorder of cartilage, unspecified: Secondary | ICD-10-CM

## 2014-03-13 DIAGNOSIS — Z7982 Long term (current) use of aspirin: Secondary | ICD-10-CM

## 2014-03-13 DIAGNOSIS — J449 Chronic obstructive pulmonary disease, unspecified: Secondary | ICD-10-CM | POA: Diagnosis present

## 2014-03-13 DIAGNOSIS — N189 Chronic kidney disease, unspecified: Secondary | ICD-10-CM

## 2014-03-13 DIAGNOSIS — E1129 Type 2 diabetes mellitus with other diabetic kidney complication: Secondary | ICD-10-CM | POA: Diagnosis present

## 2014-03-13 DIAGNOSIS — J9601 Acute respiratory failure with hypoxia: Secondary | ICD-10-CM

## 2014-03-13 DIAGNOSIS — E860 Dehydration: Secondary | ICD-10-CM | POA: Diagnosis present

## 2014-03-13 DIAGNOSIS — I509 Heart failure, unspecified: Secondary | ICD-10-CM | POA: Diagnosis present

## 2014-03-13 DIAGNOSIS — E1142 Type 2 diabetes mellitus with diabetic polyneuropathy: Secondary | ICD-10-CM | POA: Diagnosis present

## 2014-03-13 DIAGNOSIS — E1140 Type 2 diabetes mellitus with diabetic neuropathy, unspecified: Secondary | ICD-10-CM | POA: Diagnosis present

## 2014-03-13 DIAGNOSIS — I10 Essential (primary) hypertension: Secondary | ICD-10-CM

## 2014-03-13 DIAGNOSIS — I129 Hypertensive chronic kidney disease with stage 1 through stage 4 chronic kidney disease, or unspecified chronic kidney disease: Secondary | ICD-10-CM | POA: Diagnosis present

## 2014-03-13 DIAGNOSIS — M899 Disorder of bone, unspecified: Secondary | ICD-10-CM | POA: Diagnosis present

## 2014-03-13 DIAGNOSIS — I2789 Other specified pulmonary heart diseases: Secondary | ICD-10-CM | POA: Diagnosis present

## 2014-03-13 DIAGNOSIS — I351 Nonrheumatic aortic (valve) insufficiency: Secondary | ICD-10-CM

## 2014-03-13 DIAGNOSIS — Z6835 Body mass index (BMI) 35.0-35.9, adult: Secondary | ICD-10-CM

## 2014-03-13 DIAGNOSIS — N183 Chronic kidney disease, stage 3 unspecified: Secondary | ICD-10-CM | POA: Diagnosis present

## 2014-03-13 DIAGNOSIS — R0602 Shortness of breath: Secondary | ICD-10-CM

## 2014-03-13 DIAGNOSIS — I272 Pulmonary hypertension, unspecified: Secondary | ICD-10-CM

## 2014-03-13 DIAGNOSIS — E213 Hyperparathyroidism, unspecified: Secondary | ICD-10-CM | POA: Diagnosis present

## 2014-03-13 DIAGNOSIS — E871 Hypo-osmolality and hyponatremia: Secondary | ICD-10-CM | POA: Diagnosis present

## 2014-03-13 DIAGNOSIS — I359 Nonrheumatic aortic valve disorder, unspecified: Secondary | ICD-10-CM | POA: Diagnosis present

## 2014-03-13 DIAGNOSIS — E1149 Type 2 diabetes mellitus with other diabetic neurological complication: Secondary | ICD-10-CM

## 2014-03-13 DIAGNOSIS — Z823 Family history of stroke: Secondary | ICD-10-CM

## 2014-03-13 DIAGNOSIS — Z833 Family history of diabetes mellitus: Secondary | ICD-10-CM

## 2014-03-13 DIAGNOSIS — IMO0002 Reserved for concepts with insufficient information to code with codable children: Secondary | ICD-10-CM

## 2014-03-13 DIAGNOSIS — T380X5A Adverse effect of glucocorticoids and synthetic analogues, initial encounter: Secondary | ICD-10-CM | POA: Diagnosis not present

## 2014-03-13 DIAGNOSIS — N170 Acute kidney failure with tubular necrosis: Secondary | ICD-10-CM | POA: Diagnosis present

## 2014-03-13 DIAGNOSIS — D631 Anemia in chronic kidney disease: Secondary | ICD-10-CM | POA: Diagnosis present

## 2014-03-13 DIAGNOSIS — E538 Deficiency of other specified B group vitamins: Secondary | ICD-10-CM

## 2014-03-13 DIAGNOSIS — N179 Acute kidney failure, unspecified: Secondary | ICD-10-CM

## 2014-03-13 DIAGNOSIS — Z8249 Family history of ischemic heart disease and other diseases of the circulatory system: Secondary | ICD-10-CM

## 2014-03-13 DIAGNOSIS — J4489 Other specified chronic obstructive pulmonary disease: Secondary | ICD-10-CM | POA: Diagnosis present

## 2014-03-13 LAB — URINALYSIS, ROUTINE W REFLEX MICROSCOPIC
Glucose, UA: NEGATIVE mg/dL
Ketones, ur: NEGATIVE mg/dL
LEUKOCYTES UA: NEGATIVE
Nitrite: NEGATIVE
PROTEIN: 100 mg/dL — AB
Specific Gravity, Urine: >1.03 — ABNORMAL HIGH (ref 1.005–1.030)
Urobilinogen, UA: 0.2 mg/dL (ref 0.0–1.0)
pH: 5.5 (ref 5.0–8.0)

## 2014-03-13 LAB — BASIC METABOLIC PANEL
BUN: 28 mg/dL — ABNORMAL HIGH (ref 6–23)
CALCIUM: 8.3 mg/dL — AB (ref 8.4–10.5)
CO2: 21 mEq/L (ref 19–32)
Chloride: 92 mEq/L — ABNORMAL LOW (ref 96–112)
Creatinine, Ser: 2.41 mg/dL — ABNORMAL HIGH (ref 0.50–1.10)
GFR calc Af Amer: 23 mL/min — ABNORMAL LOW (ref >90)
GFR calc non Af Amer: 20 mL/min — ABNORMAL LOW (ref >90)
Glucose, Bld: 323 mg/dL — ABNORMAL HIGH (ref 70–99)
Potassium: 3.7 mEq/L (ref 3.7–5.3)
SODIUM: 129 meq/L — AB (ref 137–147)

## 2014-03-13 LAB — CBC WITH DIFFERENTIAL/PLATELET
BLASTS: 0 %
Band Neutrophils: 0 % (ref 0–10)
Basophils Absolute: 0 10*3/uL (ref 0.0–0.1)
Basophils Relative: 0 % (ref 0–1)
EOS ABS: 0 10*3/uL (ref 0.0–0.7)
Eosinophils Relative: 0 % (ref 0–5)
HCT: 23.3 % — ABNORMAL LOW (ref 36.0–46.0)
Hemoglobin: 7.9 g/dL — ABNORMAL LOW (ref 12.0–15.0)
LYMPHS PCT: 0 % — AB (ref 12–46)
Lymphs Abs: 0.9 10*3/uL (ref 0.7–4.0)
MCH: 28.5 pg (ref 26.0–34.0)
MCHC: 33.9 g/dL (ref 30.0–36.0)
MCV: 84.1 fL (ref 78.0–100.0)
MONO ABS: 0.6 10*3/uL (ref 0.1–1.0)
Metamyelocytes Relative: 0 %
Monocytes Relative: 0 % — ABNORMAL LOW (ref 3–12)
Myelocytes: 0 %
NEUTROS PCT: 0 % — AB (ref 43–77)
Neutro Abs: 4.1 10*3/uL (ref 1.7–7.7)
PROMYELOCYTES ABS: 0 %
Platelets: 136 10*3/uL — ABNORMAL LOW (ref 150–400)
RBC: 2.77 MIL/uL — AB (ref 3.87–5.11)
RDW: 16.6 % — ABNORMAL HIGH (ref 11.5–15.5)
WBC: 5.7 10*3/uL (ref 4.0–10.5)

## 2014-03-13 LAB — GLUCOSE, CAPILLARY
GLUCOSE-CAPILLARY: 256 mg/dL — AB (ref 70–99)
GLUCOSE-CAPILLARY: 146 mg/dL — AB (ref 70–99)

## 2014-03-13 LAB — ABO/RH: ABO/RH(D): A POS

## 2014-03-13 LAB — PREPARE RBC (CROSSMATCH)

## 2014-03-13 LAB — CBG MONITORING, ED: GLUCOSE-CAPILLARY: 290 mg/dL — AB (ref 70–99)

## 2014-03-13 LAB — URINE MICROSCOPIC-ADD ON

## 2014-03-13 LAB — TROPONIN I

## 2014-03-13 MED ORDER — ACETAMINOPHEN 650 MG RE SUPP: 650.0000 mg | Freq: Four times a day (QID) | RECTAL | Status: DC | PRN

## 2014-03-13 MED ORDER — MELOXICAM 7.5 MG PO TABS
7.5000 mg | ORAL_TABLET | Freq: Two times a day (BID) | ORAL | Status: DC
  Administered 2014-03-13 – 2014-03-14 (×3): 7.5 mg via ORAL
  Filled 2014-03-13 (×5): qty 1

## 2014-03-13 MED ORDER — INSULIN ASPART 100 UNIT/ML ~~LOC~~ SOLN
0.0000 [IU] | Freq: Three times a day (TID) | SUBCUTANEOUS | Status: DC
  Administered 2014-03-13: 8 [IU] via SUBCUTANEOUS
  Administered 2014-03-14 – 2014-03-15 (×4): 3 [IU] via SUBCUTANEOUS

## 2014-03-13 MED ORDER — ACETAMINOPHEN 325 MG PO TABS
650.0000 mg | ORAL_TABLET | Freq: Four times a day (QID) | ORAL | Status: DC | PRN
  Administered 2014-03-14 – 2014-03-23 (×4): 650 mg via ORAL
  Filled 2014-03-13 (×4): qty 2

## 2014-03-13 MED ORDER — LOSARTAN POTASSIUM 50 MG PO TABS: 100.0000 mg | ORAL_TABLET | Freq: Every day | ORAL | Status: DC

## 2014-03-13 MED ORDER — ONDANSETRON HCL 4 MG PO TABS: 4.0000 mg | ORAL_TABLET | Freq: Four times a day (QID) | ORAL | Status: DC | PRN

## 2014-03-13 MED ORDER — ONDANSETRON HCL 4 MG/2ML IJ SOLN
4.0000 mg | Freq: Four times a day (QID) | INTRAMUSCULAR | Status: DC | PRN
  Administered 2014-03-16: 4 mg via INTRAVENOUS
  Filled 2014-03-13: qty 2

## 2014-03-13 MED ORDER — ALBUTEROL SULFATE (2.5 MG/3ML) 0.083% IN NEBU
3.0000 mL | INHALATION_SOLUTION | Freq: Four times a day (QID) | RESPIRATORY_TRACT | Status: DC | PRN
  Administered 2014-03-13 – 2014-03-14 (×2): 3 mL via RESPIRATORY_TRACT
  Filled 2014-03-13 (×2): qty 3

## 2014-03-13 MED ORDER — SODIUM CHLORIDE 0.9 % IJ SOLN
3.0000 mL | Freq: Two times a day (BID) | INTRAMUSCULAR | Status: DC
  Administered 2014-03-13 – 2014-03-15 (×4): 3 mL via INTRAVENOUS
  Administered 2014-03-16: 10 mL via INTRAVENOUS
  Administered 2014-03-17 – 2014-03-28 (×20): 3 mL via INTRAVENOUS

## 2014-03-13 MED ORDER — ALUM & MAG HYDROXIDE-SIMETH 200-200-20 MG/5ML PO SUSP: 30.0000 mL | Freq: Four times a day (QID) | ORAL | Status: DC | PRN

## 2014-03-13 MED ORDER — VITAMIN D (ERGOCALCIFEROL) 1.25 MG (50000 UNIT) PO CAPS
50000.0000 [IU] | ORAL_CAPSULE | ORAL | Status: DC
  Administered 2014-03-14: 50000 [IU] via ORAL
  Filled 2014-03-13: qty 1

## 2014-03-13 MED ORDER — ASPIRIN EC 81 MG PO TBEC
81.0000 mg | DELAYED_RELEASE_TABLET | Freq: Every day | ORAL | Status: DC
  Administered 2014-03-13 – 2014-03-28 (×16): 81 mg via ORAL
  Filled 2014-03-13 (×16): qty 1

## 2014-03-13 MED ORDER — FOLIC ACID 1 MG PO TABS
1.0000 mg | ORAL_TABLET | Freq: Every day | ORAL | Status: DC
  Administered 2014-03-13 – 2014-03-28 (×16): 1 mg via ORAL
  Filled 2014-03-13 (×16): qty 1

## 2014-03-13 MED ORDER — ENOXAPARIN SODIUM 40 MG/0.4ML ~~LOC~~ SOLN
40.0000 mg | SUBCUTANEOUS | Status: DC
  Administered 2014-03-13: 40 mg via SUBCUTANEOUS
  Filled 2014-03-13: qty 0.4

## 2014-03-13 MED ORDER — ATORVASTATIN CALCIUM 40 MG PO TABS
40.0000 mg | ORAL_TABLET | Freq: Every day | ORAL | Status: DC
  Administered 2014-03-13 – 2014-03-27 (×15): 40 mg via ORAL
  Filled 2014-03-13 (×18): qty 1

## 2014-03-13 MED ORDER — LINAGLIPTIN 5 MG PO TABS: 5.0000 mg | ORAL_TABLET | Freq: Every day | ORAL | Status: DC

## 2014-03-13 MED ORDER — BISACODYL 10 MG RE SUPP: 10.0000 mg | Freq: Every day | RECTAL | Status: DC | PRN

## 2014-03-13 MED ORDER — METOPROLOL TARTRATE 50 MG PO TABS
50.0000 mg | ORAL_TABLET | Freq: Two times a day (BID) | ORAL | Status: DC
  Administered 2014-03-13 – 2014-03-28 (×31): 50 mg via ORAL
  Filled 2014-03-13 (×32): qty 1

## 2014-03-13 MED ORDER — HYDROXYCHLOROQUINE SULFATE 200 MG PO TABS
200.0000 mg | ORAL_TABLET | Freq: Two times a day (BID) | ORAL | Status: DC
  Administered 2014-03-13 – 2014-03-28 (×31): 200 mg via ORAL
  Filled 2014-03-13 (×38): qty 1

## 2014-03-13 MED ORDER — SODIUM CHLORIDE 0.9 % IV SOLN
INTRAVENOUS | Status: DC
  Administered 2014-03-13 (×2): via INTRAVENOUS

## 2014-03-13 MED ORDER — SODIUM CHLORIDE 0.9 % IV SOLN
Freq: Once | INTRAVENOUS | Status: AC
  Administered 2014-03-13: 11:00:00 via INTRAVENOUS

## 2014-03-13 MED ORDER — INSULIN ASPART 100 UNIT/ML ~~LOC~~ SOLN
0.0000 [IU] | Freq: Every day | SUBCUTANEOUS | Status: DC
  Administered 2014-03-14: 2 [IU] via SUBCUTANEOUS
  Administered 2014-03-19: 5 [IU] via SUBCUTANEOUS
  Administered 2014-03-20: 4 [IU] via SUBCUTANEOUS
  Administered 2014-03-22 – 2014-03-23 (×2): 2 [IU] via SUBCUTANEOUS
  Administered 2014-03-24: 3 [IU] via SUBCUTANEOUS
  Administered 2014-03-25 – 2014-03-26 (×2): 5 [IU] via SUBCUTANEOUS
  Administered 2014-03-27: 3 [IU] via SUBCUTANEOUS

## 2014-03-13 MED ORDER — METHOTREXATE 2.5 MG PO TABS: 10.0000 mg | ORAL_TABLET | ORAL | Status: DC

## 2014-03-13 MED ORDER — PREDNISONE 5 MG PO TABS
2.5000 mg | ORAL_TABLET | Freq: Every day | ORAL | Status: DC
  Administered 2014-03-14 – 2014-03-21 (×8): 2.5 mg via ORAL
  Filled 2014-03-13: qty 0.5
  Filled 2014-03-13: qty 1
  Filled 2014-03-13: qty 0.5
  Filled 2014-03-13 (×3): qty 1
  Filled 2014-03-13 (×5): qty 0.5

## 2014-03-13 NOTE — H&P (Signed)
[facility] History and Physical  [name] [id number] DOB: 04/07/1946 DOA: 03/13/2014  Referring physician:  PCP: [name], MD   Chief Complaint: weakness  HPI: [name] is a 68 y.o. female with past medical history that includes diabetes type 2, hypertension, COPD, anemia, chronic kidney disease stage III, atrial flutter presents to the emergency department with chief complaint of generalized weakness. Information is obtained from the patient. She reports that yesterday she began to feel "generally bad all over". She describes generalized weakness, decreased stamina with activity, increased shortness of breath with activity. She denies any fever chills coughing chest pain palpitations lower shin edema orthopnea. She denies any abdominal pain nausea vomiting dysuria hematuria frequency or urgency. She does indicate that she has been eating and drinking less than her normal amount. He denies any specific pain. Initial evaluation in the emergency room reveals hyponatremia 129, hyperglycemia 290 acute on chronic renal failure with a creatinine of 2.41 an anemia with a hemoglobin of 7.9. Chest x-ray shows cardiac enlargement with mild vascular congestion without infiltrates or effusions. EKG yields normal sinus rhythm. While in the emergency department she is hemodynamically stable she is afebrile she is not hypoxic. In the emergency department she is provided with 1 L of normal saline intravenously. We are asked to admit for further evaluation    Review of Systems:  10 point review of systems completed and all systems are negative except as indicated in the history of present illness  Past Medical History  Diagnosis Date  . Asthma   . Essential hypertension, benign   . Type 2 diabetes mellitus   . SLE (systemic lupus erythematosus)   . Anemia   . Atrial flutter     TEE/DCCV December 2013 - on Xarelto [facility])  . CKD (chronic kidney disease) stage 3,  GFR 30-59 ml/min    Past Surgical History  Procedure Laterality Date  . Ankle surgery  1993  . Back surgery  1980  . Abdominal hysterectomy  1983  . Tee without cardioversion  09/16/2012    Procedure: TRANSESOPHAGEAL ECHOCARDIOGRAM (TEE);  Surgeon: Sanda Klein, MD;  Location: [place];  Service: Cardiovascular;  Laterality: N/A;  . Cardioversion  09/16/2012    Procedure: CARDIOVERSION;  Surgeon: Sanda Klein, MD;  Location: [facility] ENDOSCOPY;  Service: Cardiovascular;  Laterality: N/A;  . Esophagogastroduodenoscopy  09/20/2012    Procedure: ESOPHAGOGASTRODUODENOSCOPY (EGD);  Surgeon: Cleotis Nipper, MD;  Location: [facility] ENDOSCOPY;  Service: Endoscopy;  Laterality: Left;  . Colonoscopy  09/20/2012    Procedure: COLONOSCOPY;  Surgeon: Cleotis Nipper, MD;  Location: [facility] ENDOSCOPY;  Service: Endoscopy;  Laterality: Left;   Social History:  reports that she has quit smoking. Her smoking use included Cigarettes. She has a 30 pack-year smoking history. She does not have any smokeless tobacco history on file. She reports that she does not drink alcohol or use illicit drugs.  No Known Allergies  Family History  Problem Relation Age of Onset  . Diabetes Sister   . Diabetes Brother   . Diabetes Father   . Hypertension Father   . Hypertension Sister   . Hypertension Brother   . Stroke Mother      Prior to Admission medications   Medication Sig Start Date End Date Taking? Authorizing Provider  acetaminophen (TYLENOL) 500 MG tablet Take 1,000 mg by mouth every 6 (six) hours as needed. For pain   Yes Historical Provider, MD  albuterol (PROVENTIL HFA;VENTOLIN HFA) 108 (90 BASE) MCG/ACT inhaler  Inhale 2 puffs into the lungs every 6 (six) hours as needed. For shortness of breath   Yes Historical Provider, MD  aspirin EC 81 MG tablet Take 81 mg by mouth daily.   Yes Historical Provider, MD  folic acid (FOLVITE) 1 MG tablet Take 1 mg by mouth daily.   Yes Historical Provider, MD  furosemide  (LASIX) 20 MG tablet Take 40 mg by mouth 3 (three) times daily as needed for fluid.  09/20/12  Yes Luke K Kilroy, PA-C  hydroxychloroquine (PLAQUENIL) 200 MG tablet Take 200 mg by mouth 2 (two) times daily.   Yes Historical Provider, MD  losartan-hydrochlorothiazide (HYZAAR) 100-25 MG per tablet Take 1 tablet by mouth daily.   Yes Historical Provider, MD  meloxicam (MOBIC) 7.5 MG tablet Take 7.5 mg by mouth 2 (two) times daily.   Yes Historical Provider, MD  metFORMIN (GLUCOPHAGE) 500 MG tablet Take by mouth 2 (two) times daily with a meal.   Yes Historical Provider, MD  methotrexate (RHEUMATREX) 2.5 MG tablet Take 10 mg by mouth once a week. Caution:Chemotherapy. Protect from light.   Yes Historical Provider, MD  metoprolol (LOPRESSOR) 50 MG tablet Take 1 tablet (50 mg total) by mouth 2 (two) times daily. 04/18/13  Yes Mihai Croitoru, MD  Multiple Vitamins-Minerals (CENTRUM SILVER PO) Take 1 tablet by mouth daily.   Yes Historical Provider, MD  predniSONE (DELTASONE) 2.5 MG tablet Take 2.5 mg by mouth daily with breakfast.   Yes Historical Provider, MD  saxagliptin HCl (ONGLYZA) 2.5 MG TABS tablet Take 2.5 mg by mouth daily.   Yes Historical Provider, MD  simvastatin (ZOCOR) 80 MG tablet Take 80 mg by mouth daily.   Yes Historical Provider, MD  Vitamin D, Ergocalciferol, (DRISDOL) 50000 UNITS CAPS capsule Take 50,000 Units by mouth every 7 (seven) days. Tuesday.   Yes Historical Provider, MD   Physical Exam: Filed Vitals:   03/13/14 1223  BP: 141/54  Pulse: 92  Temp:   Resp: 20    BP 141/54  Pulse 92  Temp(Src) 98 F (36.7 C) (Oral)  Resp 20  SpO2 97%  General:  Appears calm and comfortable Eyes: PERRL, normal lids, irises & conjunctiva ENT: Ears clear nose without drainage oropharynx without erythema or exudate. Mucous membranes of her mouth are slightly pale slightly dry Neck: no LAD, masses or thyromegaly Cardiovascular: RRR, +murmur trace lower extremity edema  Respiratory:  Increased work of breathing with conversation. I hear no wheezing no crackles no rhonchi.  Abdomen: soft, ntnd positive bowel sounds throughout Skin: no rash or induration seen on limited exam Musculoskeletal: grossly normal tone BUE/BLE Psychiatric: grossly normal mood and affect, speech fluent and appropriate Neurologic: Speech clear facial symmetry cranial nerves II through XII grossly intact           Labs on Admission:  Basic Metabolic Panel:  Recent Labs Lab 03/13/14 0851  NA 129*  K 3.7  CL 92*  CO2 21  GLUCOSE 323*  BUN 28*  CREATININE 2.41*  CALCIUM 8.3*   Liver Function Tests: No results found for this basename: AST, ALT, ALKPHOS, BILITOT, PROT, ALBUMIN,  in the last 168 hours No results found for this basename: LIPASE, AMYLASE,  in the last 168 hours No results found for this basename: AMMONIA,  in the last 168 hours CBC:  Recent Labs Lab 03/08/14 1244 03/13/14 0851  WBC 3.0* 5.7  NEUTROABS 2.6 4.1  HGB 8.0* 7.9*  HCT 24.0* 23.3*  MCV 85.7 84.1  PLT 168  136*   Cardiac Enzymes:  Recent Labs Lab 03/13/14 0845  TROPONINI <0.30    BNP (last 3 results) No results found for this basename: PROBNP,  in the last 8760 hours CBG:  Recent Labs Lab 03/13/14 1147  GLUCAP 290*    Radiological Exams on Admission: Dg Chest 2 View  03/13/2014   CLINICAL DATA:  Weakness.  End-stage renal disease.  EXAM: CHEST  2 VIEW  COMPARISON:  09/04/2012  FINDINGS: The heart is enlarged but stable. Stable tortuosity and calcification of the thoracic aorta. Mild vascular congestion but no overt pulmonary edema. No pleural effusions or focal infiltrates. The bony thorax is intact.  IMPRESSION: Stable cardiac enlargement.  Mild vascular congestion but no infiltrates or effusions.   Electronically Signed   By: Kalman Jewels M.D.   On: 03/13/2014 09:55    EKG: Independently reviewed sinus rhythm  Assessment/Plan    Renal failure (ARF), acute on chronic:  Likely related to  dehydration as a result of decreased by mouth intake. Will admit for observation. Will gently hydrate with IV fluids. Will hold any nephrotoxins. Will monitor intake and output. Will recheck in the a.m. If no improvement will consider renal ultrasound.   Hyponatremia:  Likely related to dehydration. Will gently hydrate. Will recheck in the a.m. Part of it is pseudohyponatremia secondary to hyperglycemia, corrected sodium to glucose is 134.   Anemia in chronic renal disease:  Somewhat symptomatic. Mild increased work of breathing with conversation may be attributed to hemoglobin of 7.9. Chart review does indicate that current hemoglobin lower and upper range. Anticipated drop with IV fluids. Therefore will transfuse one unit of packed red blood cells. In addition patient takes weekly Procrit injections. Her next injection is due June 17. Of note chart indicates the plan is Procrit 20,000 units subcutaneous weekly until hemoglobin reaches near a Will recheck in the morning.     Dehydration:  Due to decreased by mouth intake. Patient denies nausea vomiting diarrhea. She's requesting food he states he is hungry. Will gently hydrate with IV fluids. Will check orthostatics. Will monitor intake and output.    Essential hypertension, benign:    Appears controlled.  I medications include losartan and HCTZ we will hold these for now do to #1. Will continue her beta blocker and monitor closely. Provide when necessary hydralazine as indicated    COPD (chronic obstructive pulmonary disease):  Appears stable at baseline. Will continue her home medications     Atrial flutter:  Chart review indicates recent followup with cardiology. Notes indicate patient has been maintaining normal sinus rhythm. EKG as above.    Morbid obesity:  BMI 34.3. Will obtain nutritional consult    Type 2 diabetes mellitus with diabetic neuropathy:  Will obtain a hemoglobin A1c. Will hold her oral agents for now. We'll provide  sliding scale insulin for optimal control.    SLE (systemic lupus erythematosus):  Stable. On treatment with hydroxychloroquine    Code Status: full Family Communication: none present Disposition Plan: home hopefully tomorrow  Time spent: 23 minutes  [facility] Pager [phone]  **Disclaimer: This note may have been dictated with voice recognition software. Similar sounding words can inadvertently be transcribed and this note may contain transcription errors which may not have been corrected upon publication of note.**   Addendum  Patient seen and examined, chart and data base reviewed.  I agree with the above assessment and plan.  For full details please see Mrs. Radene Gunning, NP note.  I reviewed and amended the above note as needed.   Birdie Hopes, MD [facility] Pager: [phone] 03/13/2014, 1:30 PM

## 2014-03-13 NOTE — ED Notes (Signed)
Pt c/o generalized weakness since last SUnday.  Reports history of lupus, COPD, and recently diagnosed with anemia.  Reports has appt at [facility] for infusions but pt unsure of what kind of infusions.  Pt denies pain.  Reports also has had a bad odor from vagina.  Denies pain or burning with urination, denies vaginal discharge.

## 2014-03-13 NOTE — ED Notes (Signed)
hospitalist in to eval 

## 2014-03-13 NOTE — ED Notes (Signed)
Pt states she just feels weak and tired. Denies n/v fever or any pain. States she goes to the oncology clinic once a week for an infusion but does not know what it is. Also, states she has a foul ssmell coming form her vagina but denies any discharge

## 2014-03-13 NOTE — ED Provider Notes (Signed)
CSN: [id number]     Arrival date & time 03/13/14  0810 History   First MD Initiated Contact with Patient 03/13/14 0831     Chief Complaint  Patient presents with  . Weakness     (Consider location/radiation/quality/duration/timing/severity/associated sxs/prior Treatment) Patient is a 68 y.o. female presenting with weakness. The history is provided by the patient.  Weakness This is a new problem. The current episode started yesterday. The problem occurs constantly. The problem has been gradually worsening. Associated symptoms include fatigue, myalgias and weakness. Pertinent negatives include no abdominal pain, arthralgias, change in bowel habit, chest pain, chills, congestion, coughing, diaphoresis, fever, headaches, joint swelling, nausea, neck pain, numbness, rash, sore throat, swollen glands, urinary symptoms, vertigo, visual change or vomiting. Nothing aggravates the symptoms. She has tried nothing for the symptoms. The treatment provided no relief.   Patient with h/o SLE, diabetes and anemia of chronic renal disease.  C/o of generalized weakness, fatigue for one day.  She also states that she has decreased appetite and hesitancy to urinate this morning.  She denies headaches, dizziness, visual changes, chest pain, vomiting, fever or abd pain or any pain at this time.  She states that she gets weekly "shots" at the [facility], but she is unsure what the injections are for.  Nothing makes her symptoms better or worse.     Past Medical History  Diagnosis Date  . Asthma   . Essential hypertension, benign   . Type 2 diabetes mellitus   . SLE (systemic lupus erythematosus)   . Anemia   . Atrial flutter     TEE/DCCV December 2013 - on Xarelto [facility])  . CKD (chronic kidney disease) stage 3, GFR 30-59 ml/min    Past Surgical History  Procedure Laterality Date  . Ankle surgery  1993  . Back surgery  1980  . Abdominal hysterectomy  1983  . Tee without cardioversion  09/16/2012     Procedure: TRANSESOPHAGEAL ECHOCARDIOGRAM (TEE);  Surgeon: Sanda Klein, MD;  Location: [place];  Service: Cardiovascular;  Laterality: N/A;  . Cardioversion  09/16/2012    Procedure: CARDIOVERSION;  Surgeon: Sanda Klein, MD;  Location: [facility] ENDOSCOPY;  Service: Cardiovascular;  Laterality: N/A;  . Esophagogastroduodenoscopy  09/20/2012    Procedure: ESOPHAGOGASTRODUODENOSCOPY (EGD);  Surgeon: Cleotis Nipper, MD;  Location: [facility] ENDOSCOPY;  Service: Endoscopy;  Laterality: Left;  . Colonoscopy  09/20/2012    Procedure: COLONOSCOPY;  Surgeon: Cleotis Nipper, MD;  Location: [facility] ENDOSCOPY;  Service: Endoscopy;  Laterality: Left;   Family History  Problem Relation Age of Onset  . Diabetes Sister   . Diabetes Brother   . Diabetes Father   . Hypertension Father   . Hypertension Sister   . Hypertension Brother   . Stroke Mother    History  Substance Use Topics  . Smoking status: Former Smoker -- 1.00 packs/day for 30 years    Types: Cigarettes  . Smokeless tobacco: Not on file     Comment: quit 08-2012  . Alcohol Use: No   OB History   Grav Para Term Preterm Abortions TAB SAB Ect Mult Living                 Review of Systems  Constitutional: Positive for appetite change and fatigue. Negative for fever, chills, diaphoresis and activity change.  HENT: Negative for congestion, sore throat and trouble swallowing.   Eyes: Negative for visual disturbance.  Respiratory: Negative for cough, chest tightness and shortness of breath.   Cardiovascular:  Negative for chest pain.  Gastrointestinal: Negative for nausea, vomiting, abdominal pain, constipation, blood in stool, abdominal distention and change in bowel habit.  Endocrine: Negative for polydipsia and polyuria.  Genitourinary: Positive for decreased urine volume. Negative for dysuria, frequency, hematuria and flank pain.  Musculoskeletal: Positive for myalgias. Negative for arthralgias, joint swelling and neck pain.  Skin:  Negative for rash.  Neurological: Positive for weakness. Negative for dizziness, vertigo, seizures, syncope, speech difficulty, numbness and headaches.  Psychiatric/Behavioral: Negative for confusion. The patient is not nervous/anxious.   All other systems reviewed and are negative.     Allergies  Review of patient's allergies indicates no known allergies.  Home Medications   Prior to Admission medications   Medication Sig Start Date End Date Taking? Authorizing Provider  acetaminophen (TYLENOL) 500 MG tablet Take 1,000 mg by mouth every 6 (six) hours as needed. For pain   Yes Historical Provider, MD  albuterol (PROVENTIL HFA;VENTOLIN HFA) 108 (90 BASE) MCG/ACT inhaler Inhale 2 puffs into the lungs every 6 (six) hours as needed. For shortness of breath   Yes Historical Provider, MD  aspirin EC 81 MG tablet Take 81 mg by mouth daily.   Yes Historical Provider, MD  folic acid (FOLVITE) 1 MG tablet Take 1 mg by mouth daily.   Yes Historical Provider, MD  furosemide (LASIX) 20 MG tablet Take 40 mg by mouth 3 (three) times daily as needed for fluid.  09/20/12  Yes Luke K Kilroy, PA-C  hydroxychloroquine (PLAQUENIL) 200 MG tablet Take 200 mg by mouth 2 (two) times daily.   Yes Historical Provider, MD  losartan-hydrochlorothiazide (HYZAAR) 100-25 MG per tablet Take 1 tablet by mouth daily.   Yes Historical Provider, MD  meloxicam (MOBIC) 7.5 MG tablet Take 7.5 mg by mouth 2 (two) times daily.   Yes Historical Provider, MD  metFORMIN (GLUCOPHAGE) 500 MG tablet Take by mouth 2 (two) times daily with a meal.   Yes Historical Provider, MD  methotrexate (RHEUMATREX) 2.5 MG tablet Take 10 mg by mouth once a week. Caution:Chemotherapy. Protect from light.   Yes Historical Provider, MD  metoprolol (LOPRESSOR) 50 MG tablet Take 1 tablet (50 mg total) by mouth 2 (two) times daily. 04/18/13  Yes Mihai Croitoru, MD  Multiple Vitamins-Minerals (CENTRUM SILVER PO) Take 1 tablet by mouth daily.   Yes Historical  Provider, MD  predniSONE (DELTASONE) 2.5 MG tablet Take 2.5 mg by mouth daily with breakfast.   Yes Historical Provider, MD  saxagliptin HCl (ONGLYZA) 2.5 MG TABS tablet Take 2.5 mg by mouth daily.   Yes Historical Provider, MD  simvastatin (ZOCOR) 80 MG tablet Take 80 mg by mouth daily.   Yes Historical Provider, MD  Vitamin D, Ergocalciferol, (DRISDOL) 50000 UNITS CAPS capsule Take 50,000 Units by mouth every 7 (seven) days. Tuesday.   Yes Historical Provider, MD   BP 138/54  Pulse 97  Temp(Src) 98 F (36.7 C) (Oral)  Resp 20  SpO2 97% Physical Exam  Nursing note and vitals reviewed. Constitutional: She is oriented to person, place, and time. She appears well-developed and well-nourished. No distress.  Uncomfortable appearing  HENT:  Head: Normocephalic and atraumatic.  Mouth/Throat: Uvula is midline and oropharynx is clear and moist. Mucous membranes are dry.  Eyes: Conjunctivae and EOM are normal. Pupils are equal, round, and reactive to light. No scleral icterus.  Neck: Normal range of motion. Neck supple.  Cardiovascular: Regular rhythm, normal heart sounds and intact distal pulses.   No murmur heard. Mildly tachycardic  Pulmonary/Chest: Effort normal and breath sounds normal. No respiratory distress.  Abdominal: Soft. Bowel sounds are normal. She exhibits no distension and no mass. There is no tenderness. There is no rebound and no guarding.  Musculoskeletal: Normal range of motion.  Lymphadenopathy:    She has no cervical adenopathy.  Neurological: She is alert and oriented to person, place, and time. She exhibits normal muscle tone. Coordination normal.  Skin: Skin is warm and dry.  Psychiatric: She has a normal mood and affect.    ED Course  Procedures (including critical care time) Labs Review Labs Reviewed  CBC WITH DIFFERENTIAL - Abnormal; Notable for the following:    RBC 2.77 (*)    Hemoglobin 7.9 (*)    HCT 23.3 (*)    RDW 16.6 (*)    Platelets 136 (*)     Neutrophils Relative % 0 (*)    Lymphocytes Relative 0 (*)    Monocytes Relative 0 (*)    All other components within normal limits  BASIC METABOLIC PANEL - Abnormal; Notable for the following:    Sodium 129 (*)    Chloride 92 (*)    Glucose, Bld 323 (*)    BUN 28 (*)    Creatinine, Ser 2.41 (*)    Calcium 8.3 (*)    GFR calc non Af Amer 20 (*)    GFR calc Af Amer 23 (*)    All other components within normal limits  URINALYSIS, ROUTINE W REFLEX MICROSCOPIC - Abnormal; Notable for the following:    Specific Gravity, Urine >1.030 (*)    Hgb urine dipstick TRACE (*)    Bilirubin Urine SMALL (*)    Protein, ur 100 (*)    All other components within normal limits  URINE MICROSCOPIC-ADD ON - Abnormal; Notable for the following:    Bacteria, UA MANY (*)    All other components within normal limits  URINE CULTURE  TROPONIN I    Imaging Review Dg Chest 2 View  03/13/2014   CLINICAL DATA:  Weakness.  End-stage renal disease.  EXAM: CHEST  2 VIEW  COMPARISON:  09/04/2012  FINDINGS: The heart is enlarged but stable. Stable tortuosity and calcification of the thoracic aorta. Mild vascular congestion but no overt pulmonary edema. No pleural effusions or focal infiltrates. The bony thorax is intact.  IMPRESSION: Stable cardiac enlargement.  Mild vascular congestion but no infiltrates or effusions.   Electronically Signed   By: Kalman Jewels M.D.   On: 03/13/2014 09:55     EKG Interpretation   Date/Time:  Monday March 13 2014 11:45:52 EDT Ventricular Rate:  77 PR Interval:  144 QRS Duration: 95 QT Interval:  460 QTC Calculation: 521 R Axis:   10 Text Interpretation:  Sinus rhythm Atrial premature complex Probable left  ventricular hypertrophy Prolonged QT interval Confirmed by ZAMMIT  MD,  JOSEPH ([id number]) on 03/13/2014 12:03:54 PM     MDM   Final diagnoses:  None    Pt appears uncomfortable , non-toxic.  Pt with h/o anemia of chronic renal disease, SLE, diabetes.  Appears  dehydrated.  Will check labs, IVF's.  Pt also seen by Dr. Roderic Palau and results reviewed and discussed.  Pt has symptomatic anemia, dehydration and hyponatermia today with procrit injection given 03/08/14.  Will consult hospitalist for admit  Consulted Dr. Hartford Poli will admit pt.  to [place]. Jeniel Slauson, PA-C 03/13/14 1153  Kelten Enochs L. Einer Meals, PA-C 03/13/14 1204

## 2014-03-13 NOTE — ED Provider Notes (Signed)
Medical screening examination/treatment/procedure(s) were conducted as a shared visit with non-physician practitioner(s) and myself.  I personally evaluated the patient during the encounter.   EKG Interpretation   Date/Time:  Monday March 13 2014 11:45:52 EDT Ventricular Rate:  77 PR Interval:  144 QRS Duration: 95 QT Interval:  460 QTC Calculation: 521 R Axis:   10 Text Interpretation:  Sinus rhythm Atrial premature complex Probable left  ventricular hypertrophy Prolonged QT interval Confirmed by Celise Bazar  MD,  Rexanne Inocencio ([id number]) on 03/13/2014 12:03:54 PM      Pt with weakness,  pe lungs clear,  Heart rrr,  abd nl  Maudry Diego, MD 03/13/14 1514

## 2014-03-14 ENCOUNTER — Observation Stay (HOSPITAL_COMMUNITY): Payer: Medicare Other

## 2014-03-14 DIAGNOSIS — J449 Chronic obstructive pulmonary disease, unspecified: Secondary | ICD-10-CM

## 2014-03-14 DIAGNOSIS — R0989 Other specified symptoms and signs involving the circulatory and respiratory systems: Secondary | ICD-10-CM

## 2014-03-14 DIAGNOSIS — R0609 Other forms of dyspnea: Secondary | ICD-10-CM

## 2014-03-14 DIAGNOSIS — R0600 Dyspnea, unspecified: Secondary | ICD-10-CM | POA: Diagnosis present

## 2014-03-14 DIAGNOSIS — I369 Nonrheumatic tricuspid valve disorder, unspecified: Secondary | ICD-10-CM

## 2014-03-14 DIAGNOSIS — I5033 Acute on chronic diastolic (congestive) heart failure: Secondary | ICD-10-CM | POA: Diagnosis present

## 2014-03-14 LAB — GLUCOSE, CAPILLARY
Glucose-Capillary: 180 mg/dL — ABNORMAL HIGH (ref 70–99)
Glucose-Capillary: 181 mg/dL — ABNORMAL HIGH (ref 70–99)
Glucose-Capillary: 174 mg/dL — ABNORMAL HIGH (ref 70–99)
Glucose-Capillary: 223 mg/dL — ABNORMAL HIGH (ref 70–99)

## 2014-03-14 LAB — MRSA PCR SCREENING: MRSA by PCR: NEGATIVE

## 2014-03-14 LAB — URINE MICROSCOPIC-ADD ON

## 2014-03-14 LAB — CBC
HCT: 24.4 % — ABNORMAL LOW (ref 36.0–46.0)
HEMOGLOBIN: 8.2 g/dL — AB (ref 12.0–15.0)
MCH: 28.2 pg (ref 26.0–34.0)
MCHC: 33.6 g/dL (ref 30.0–36.0)
MCV: 83.8 fL (ref 78.0–100.0)
PLATELETS: 149 10*3/uL — AB (ref 150–400)
RBC: 2.91 MIL/uL — ABNORMAL LOW (ref 3.87–5.11)
RDW: 16.9 % — ABNORMAL HIGH (ref 11.5–15.5)
WBC: 7.9 10*3/uL (ref 4.0–10.5)

## 2014-03-14 LAB — COMPREHENSIVE METABOLIC PANEL
ALT: 8 U/L (ref 0–35)
AST: 20 U/L (ref 0–37)
Albumin: 2.3 g/dL — ABNORMAL LOW (ref 3.5–5.2)
Alkaline Phosphatase: 67 U/L (ref 39–117)
BILIRUBIN TOTAL: 0.8 mg/dL (ref 0.3–1.2)
BUN: 30 mg/dL — ABNORMAL HIGH (ref 6–23)
CALCIUM: 7.5 mg/dL — AB (ref 8.4–10.5)
CHLORIDE: 101 meq/L (ref 96–112)
CO2: 22 meq/L (ref 19–32)
Creatinine, Ser: 2.2 mg/dL — ABNORMAL HIGH (ref 0.50–1.10)
GFR, EST AFRICAN AMERICAN: 25 mL/min — AB (ref >90)
GFR, EST NON AFRICAN AMERICAN: 22 mL/min — AB (ref >90)
GLUCOSE: 198 mg/dL — AB (ref 70–99)
Potassium: 4 mEq/L (ref 3.7–5.3)
SODIUM: 135 meq/L — AB (ref 137–147)
Total Protein: 6.5 g/dL (ref 6.0–8.3)

## 2014-03-14 LAB — HEMOGLOBIN A1C
HEMOGLOBIN A1C: 6.9 % — AB (ref <5.7)
MEAN PLASMA GLUCOSE: 151 mg/dL — AB (ref <117)

## 2014-03-14 LAB — TROPONIN I: Troponin I: <0.3 ng/mL (ref <0.30)

## 2014-03-14 LAB — URINE CULTURE
Colony Count: NO GROWTH
Culture: NO GROWTH

## 2014-03-14 LAB — PREPARE RBC (CROSSMATCH)

## 2014-03-14 MED ORDER — BIOTENE DRY MOUTH MT LIQD
15.0000 mL | Freq: Two times a day (BID) | OROMUCOSAL | Status: DC
  Administered 2014-03-15 – 2014-03-28 (×18): 15 mL via OROMUCOSAL

## 2014-03-14 MED ORDER — ALBUTEROL SULFATE (2.5 MG/3ML) 0.083% IN NEBU
3.0000 mL | INHALATION_SOLUTION | Freq: Four times a day (QID) | RESPIRATORY_TRACT | Status: DC
  Administered 2014-03-14 – 2014-03-16 (×9): 3 mL via RESPIRATORY_TRACT
  Filled 2014-03-14 (×9): qty 3

## 2014-03-14 MED ORDER — VANCOMYCIN HCL IN DEXTROSE 1-5 GM/200ML-% IV SOLN
1000.0000 mg | INTRAVENOUS | Status: DC
  Administered 2014-03-14 – 2014-03-15 (×2): 1000 mg via INTRAVENOUS
  Filled 2014-03-14 (×2): qty 200

## 2014-03-14 MED ORDER — FUROSEMIDE 10 MG/ML IJ SOLN
40.0000 mg | Freq: Once | INTRAMUSCULAR | Status: AC
  Administered 2014-03-14: 40 mg via INTRAVENOUS
  Filled 2014-03-14: qty 4

## 2014-03-14 MED ORDER — ALBUTEROL SULFATE (2.5 MG/3ML) 0.083% IN NEBU
2.5000 mg | INHALATION_SOLUTION | RESPIRATORY_TRACT | Status: DC | PRN
  Administered 2014-03-14: 2.5 mg via RESPIRATORY_TRACT
  Filled 2014-03-14 (×3): qty 3

## 2014-03-14 MED ORDER — CHLORHEXIDINE GLUCONATE 0.12 % MT SOLN
15.0000 mL | Freq: Two times a day (BID) | OROMUCOSAL | Status: DC
  Administered 2014-03-14 – 2014-03-28 (×24): 15 mL via OROMUCOSAL
  Filled 2014-03-14 (×30): qty 15

## 2014-03-14 MED ORDER — VANCOMYCIN HCL IN DEXTROSE 1-5 GM/200ML-% IV SOLN
INTRAVENOUS | Status: AC
  Filled 2014-03-14: qty 200

## 2014-03-14 MED ORDER — FUROSEMIDE 10 MG/ML IJ SOLN
60.0000 mg | Freq: Two times a day (BID) | INTRAMUSCULAR | Status: DC
  Administered 2014-03-15 (×2): 60 mg via INTRAVENOUS
  Filled 2014-03-14 (×3): qty 6

## 2014-03-14 MED ORDER — ENOXAPARIN SODIUM 30 MG/0.3ML ~~LOC~~ SOLN
30.0000 mg | SUBCUTANEOUS | Status: DC
  Administered 2014-03-14 – 2014-03-17 (×4): 30 mg via SUBCUTANEOUS
  Filled 2014-03-14 (×5): qty 0.3

## 2014-03-14 MED ORDER — PIPERACILLIN-TAZOBACTAM 3.375 G IVPB
3.3750 g | Freq: Three times a day (TID) | INTRAVENOUS | Status: DC
  Administered 2014-03-14 – 2014-03-16 (×5): 3.375 g via INTRAVENOUS
  Filled 2014-03-14 (×6): qty 50

## 2014-03-14 MED ORDER — PIPERACILLIN-TAZOBACTAM 3.375 G IVPB
INTRAVENOUS | Status: AC
  Filled 2014-03-14: qty 100

## 2014-03-14 NOTE — Progress Notes (Signed)
Subjective: Called by RN, patient developed shortness of breath. Received Lasix about 4 PM, and now is receiving blood.  Objective Filed Vitals:   03/14/14 1709  BP: 180/91  Pulse: 126  Temp: 97.9 F (36.6 C)  Resp: 24  General: In moderate to severe distress, diaphoretic HEENT: anicteric sclera, pupils reactive to light and accommodation, EOMI CVS: S1-S2 clear, no murmur rubs or gallops Chest: Air movement suboptimal Abdomen: soft nontender, nondistended, normal bowel sounds, no organomegaly Extremities: no cyanosis, clubbing or edema noted bilaterally Neuro: Cranial nerves II-XII intact, no focal neurological deficits  Assessment and plan:  Respiratory distress, pending acute respiratory failure -Patient did receive 40 mg of Lasix, repeat 6 hours later and started Lasix twice a day. -Place patient on BiPAP, transfer to the ICU. -2-D echo showed grade 4 diastolic dysfunction. Obtain BNP and chest x-ray. -Patient is full code, if patient decompensates through the night she will need to be intubated. -I informed her husband Mr. Anaya Arns about the updates (phone number [phone]).  Birdie Hopes [phone] 03/14/2014, 5:35 PM

## 2014-03-14 NOTE — Progress Notes (Signed)
UR Completed.  Vergie Living [id number] 03/14/2014

## 2014-03-14 NOTE — Care Management Note (Addendum)
    Page 1 of 2   03/22/2014     2:46:33 PM CARE MANAGEMENT NOTE 03/22/2014  Patient:  [name], [name]   Account Number:  [id number]  Date Initiated:  03/14/2014  Documentation initiated by:  Theophilus Kinds  Subjective/Objective Assessment:   Pt admitted from home with renal failure. Pt lives with her husband and will return home at discharge. Pt uses a cane for prn use. Pt stated that she is fairly independent with ADL's.     Action/Plan:   Awaiting PT recommendations. Will continue to follow for discharge planning needs.   Anticipated DC Date:  03/15/2014   Anticipated DC Plan:  [place]  CM consult      Choice offered to / List presented to:     DME arranged  3-N-1  [place]      DME agency  [place] arranged  HH-1 RN  [place] agency  [place].   Status of service:  Completed, signed off Medicare Important Message given?  YES (If response is "NO", the following Medicare IM given date fields will be blank) Date Medicare IM given:  03/17/2014 Date Additional Medicare IM given:  03/22/2014  Discharge Disposition:    Per UR Regulation:    If discussed at Long Length of Stay Meetings, dates discussed:    Comments:  Camellia J. Clydene Laming, RN, BSN, [employer] [phone] CM spoke with patient concerning discharge planning. Pt offered choice for [facility] for [facility] services upon discharge. Per pt choice [facility] to provide [facility] services.  [facility] rep  contacted concerning new referral. Pt to discharge home alone. Pt request HHRN for disease management upon discharge.  03/17/14 Amy Robson RN CM Pt has a Chiropodist from [facility]. If she needs O2 at DC would like [facility]. Pt would also like Mackey RN and PT from [facility]. PT's recommendation stated Rolling walker and 3/in/1 which pt elected from [place] [phone]).  03/14/14 [place], RN BSN CM

## 2014-03-14 NOTE — Evaluation (Signed)
Physical Therapy Evaluation Patient Details Name: [name] MRN: [id number] DOB: 03-07-1946 Today's Date: 03/14/2014   History of Present Illness  [name] is a 68 y.o. female with past medical history that includes diabetes type 2, hypertension, COPD, anemia, chronic kidney disease stage III, atrial flutter presents to the emergency department with chief complaint of generalized weakness. Information is obtained from the patient. She reports that yesterday she began to feel "generally bad all over". She describes generalized weakness, decreased stamina with activity, increased shortness of breath with activity. She denies any fever chills coughing chest pain palpitations lower shin edema orthopnea. She denies any abdominal pain nausea vomiting dysuria hematuria frequency or urgency. She does indicate that she has been eating and drinking less than her normal amount. He denies any specific pain.  Clinical Impression  Patient presents to physical therapy from MD referral for assessment of functional mobility.  Patient lives with her husband in a two story home, with the bedroom and bathroom being upstairs.  Prior to her hospitalization, the patient was (I) with bed mobility skills, transfers, and was able to amb short distances with a standard cane.  Nurse practioner requested attempted mobility skills with decreased or without oxygen support, with O2 levels remaining greater than 92%.  Patient required min assist for transfer to EOB, where RN was able to assess O2 levels.  Attempted decrease to 2L (from 3L), through O2 stats varied from 88-92% at best, thus O2 levels were increased back to 3L to complete functional mobility testing.  Patient required height of bed when attempting to stand, as patient was unable to sand, despite assistance, with bed at low level.  Patient was able to amb 4 feet forward and backward x2, though seated rest break required between reps secondary to SOB.  All  activity was limited secondary to SOB with functional mobility testing, required rest breaks between activities.  Recommend continued PT while in the hospital to address generalized weakness, decreased activity tolerance, and improvement of functional mobility skills, and transition to HHPT at discharge.  Recommend RW and 3-in-1 commode to use at home, as patients bathroom/bedroom are on the second floor and patient will be unable to ascend/descend stairs throughout the day to use the facility.      Follow Up Recommendations Home health PT    Equipment Recommendations  Rolling walker with 5" wheels;3in1 (PT)       Precautions / Restrictions Precautions Precautions: Fall Precaution Comments: Watch O2 levels.  Restrictions Weight Bearing Restrictions: No      Mobility  Bed Mobility Overal bed mobility: Needs Assistance Bed Mobility: Supine to Sit;Sit to Supine     Supine to sit: HOB elevated;Min assist (HOB mildly elevated secondary to SOB) Sit to supine: HOB elevated;Min guard (HOB mildly elevated secondary to SOB)   General bed mobility comments: O2 levels taken by RN while at EOB, noted increased SOB with transfers.  VC for pursed lip breathing.     Transfers Overall transfer level: Needs assistance Equipment used: Rolling walker (2 wheeled) Transfers: Sit to/from Stand Sit to Stand: From elevated surface;Mod assist         General transfer comment: Pt unable to transfer sit <-> stand, despite assist, without bed raised   Ambulation/Gait Ambulation/Gait assistance: Min guard Ambulation Distance (Feet): 8 Feet Assistive device: Rolling walker (2 wheeled) Gait Pattern/deviations: Decreased step length - right;Decreased step length - left;Decreased dorsiflexion - right;Decreased dorsiflexion - left     General Gait Details: Patient required  sitting rest break during amb, was able to amb 8 feet x2, secondary to SOB.         Balance Overall balance assessment: No  apparent balance deficits (not formally assessed)                                           Pertinent Vitals/Pain No pain reported.     Home Living Family/patient expects to be discharged to:: Private residence Living Arrangements: Spouse/significant other Available Help at Discharge: Family Type of Home: House Home Access: Stairs to enter Entrance Stairs-Rails: None   Home Layout: Two level;Bed/bath upstairs Home Equipment: Cane - single point;Toilet riser;Shower seat - built in;Grab bars - tub/shower      Prior Function Level of Independence: Independent with assistive device(s)         Comments: Patient reports short distance amb with use of standard cane on the Lt, and uses electric scooter when in stores for longer distances     Hand Dominance   Dominant Hand: Left    Extremity/Trunk Assessment               Lower Extremity Assessment: Generalized weakness         Communication   Communication: No difficulties  Cognition Arousal/Alertness: Awake/alert Behavior During Therapy: WFL for tasks assessed/performed Overall Cognitive Status: Within Functional Limits for tasks assessed                                    Assessment/Plan    PT Assessment Patient needs continued PT services  PT Diagnosis Difficulty walking;Generalized weakness   PT Problem List Decreased strength;Decreased activity tolerance;Decreased balance;Decreased mobility;Cardiopulmonary status limiting activity  PT Treatment Interventions Balance training;Gait training;Neuromuscular re-education;Stair training;Functional mobility training;Therapeutic activities;Therapeutic exercise   PT Goals (Current goals can be found in the Care Plan section) Acute Rehab PT Goals Patient Stated Goal: Go home PT Goal Formulation: With patient/family Time For Goal Achievement: 03/28/14 Potential to Achieve Goals: Good    Frequency Min 3X/week    End of Session  Equipment Utilized During Treatment: Gait belt;Oxygen (O2 at 3L during evaluation) Activity Tolerance: Other (comment);Patient limited by fatigue (Limited due to O2 levels) Patient left: in bed;with call bell/phone within reach;with bed alarm set;with family/visitor present Nurse Communication:  (Spoke with nursing regarding drop in O2 levels when sitting EOB, inability to attempt amb with decreased O2 or without O2 as requested. )    Functional Assessment Tool Used: Clinical judgement Functional Limitation: Mobility: Walking and moving around Mobility: Walking and Moving Around Current Status [id number]): At least 60 percent but less than 80 percent impaired, limited or restricted Mobility: Walking and Moving Around Goal Status [phone]): At least 1 percent but less than 20 percent impaired, limited or restricted    Time: [id number] PT Time Calculation (min): 29 min   Charges:   PT Evaluation $Initial PT Evaluation Tier I: 1 Procedure     PT G Codes:   Functional Assessment Tool Used: Clinical judgement Functional Limitation: Mobility: Walking and moving around    [facility] 03/14/2014, 4:05 PM

## 2014-03-14 NOTE — Progress Notes (Signed)
  Echocardiogram 2D Echocardiogram has been performed.  [place], [place] 03/14/2014, 2:59 PM

## 2014-03-14 NOTE — Progress Notes (Signed)
ANTIBIOTIC CONSULT NOTE  Pharmacy Consult for Vancomycin and Zosyn   Indication: rule out sepsis  No Known Allergies  Patient Measurements: Height: 5\' 5"  (165.1 cm) Weight: 204 lb 8 oz (92.761 kg) IBW/kg (Calculated) : 57  Vital Signs: Temp: 103.1 F (39.5 C) (06/16 1800) Temp src: Axillary (06/16 1800) BP: 180/91 mmHg (06/16 1709) Pulse Rate: 126 (06/16 1709) Intake/Output from previous day: 06/15 0701 - 06/16 0700 In: 1976.3 [P.O.:100; I.V.:1526.3; Blood:350] Out: -  Intake/Output from this shift: Total I/O In: 240 [P.O.:240] Out: 900 [Urine:900]  Labs:  Recent Labs  03/13/14 0851 03/14/14 0622  WBC 5.7 7.9  HGB 7.9* 8.2*  PLT 136* 149*  CREATININE 2.41* 2.20*   Estimated Creatinine Clearance: 27.5 ml/min (by C-G formula based on Cr of 2.2). No results found for this basename: VANCOTROUGH, VANCOPEAK, VANCORANDOM, GENTTROUGH, GENTPEAK, GENTRANDOM, TOBRATROUGH, TOBRAPEAK, TOBRARND, AMIKACINPEAK, AMIKACINTROU, AMIKACIN,  in the last 72 hours   Microbiology: No results found for this or any previous visit (from the past 720 hour(s)).  Anti-infectives   Start     Dose/Rate Route Frequency Ordered Stop   03/13/14 1500  hydroxychloroquine (PLAQUENIL) tablet 200 mg     200 mg Oral 2 times daily 03/13/14 1314       Past Medical History  Diagnosis Date  . Asthma   . Essential hypertension, benign   . Type 2 diabetes mellitus   . SLE (systemic lupus erythematosus)   . Anemia   . Atrial flutter     TEE/DCCV December 2013 - on Xarelto [facility])  . CKD (chronic kidney disease) stage 3, GFR 30-59 ml/min    Medications Prior to Admission  Medication Sig Dispense Refill  . acetaminophen (TYLENOL) 500 MG tablet Take 1,000 mg by mouth every 6 (six) hours as needed. For pain      . albuterol (PROVENTIL HFA;VENTOLIN HFA) 108 (90 BASE) MCG/ACT inhaler Inhale 2 puffs into the lungs every 6 (six) hours as needed. For shortness of breath      . aspirin EC 81 MG tablet Take 81  mg by mouth daily.      . folic acid (FOLVITE) 1 MG tablet Take 1 mg by mouth daily.      . furosemide (LASIX) 20 MG tablet Take 40 mg by mouth 3 (three) times daily as needed for fluid.       . hydroxychloroquine (PLAQUENIL) 200 MG tablet Take 200 mg by mouth 2 (two) times daily.      Marland Kitchen losartan-hydrochlorothiazide (HYZAAR) 100-25 MG per tablet Take 1 tablet by mouth daily.      . meloxicam (MOBIC) 7.5 MG tablet Take 7.5 mg by mouth 2 (two) times daily.      . metFORMIN (GLUCOPHAGE) 500 MG tablet Take by mouth 2 (two) times daily with a meal.      . methotrexate (RHEUMATREX) 2.5 MG tablet Take 10 mg by mouth once a week. Caution:Chemotherapy. Protect from light.      . metoprolol (LOPRESSOR) 50 MG tablet Take 1 tablet (50 mg total) by mouth 2 (two) times daily.  60 tablet  11  . Multiple Vitamins-Minerals (CENTRUM SILVER PO) Take 1 tablet by mouth daily.      . predniSONE (DELTASONE) 2.5 MG tablet Take 2.5 mg by mouth daily with breakfast.      . saxagliptin HCl (ONGLYZA) 2.5 MG TABS tablet Take 2.5 mg by mouth daily.      . simvastatin (ZOCOR) 80 MG tablet Take 80 mg by mouth daily.      Marland Kitchen  Vitamin D, Ergocalciferol, (DRISDOL) 50000 UNITS CAPS capsule Take 50,000 Units by mouth every 7 (seven) days. Tuesday.       Assessment: Okay for Protocol, 68 yo female with fever, rule out sepsis who also recently received blood transfusion.  Goal of Therapy:  Vancomycin trough level 15-20 mcg/ml  Plan:  Zosyn 3.375gm IV every 8 hours. Follow-up micro data, labs, vitals.  Vancomycin 1gm IV every 24 hours. Measure antibiotic drug levels at steady state Follow up culture results  Pricilla Larsson 03/14/2014,6:40 PM

## 2014-03-14 NOTE — Progress Notes (Signed)
Report called to Cindy in ICU

## 2014-03-14 NOTE — Progress Notes (Signed)
[facility] PROGRESS NOTE  [name] [id number] DOB: [birth year] DOA: 03/13/2014 PCP: [name], MD  Assessment/Plan: Renal failure (ARF), acute on chronic:  Likely related to dehydration as a result of decreased by mouth intake and diuretics. Improved this am with gentle hydration and holding nephrotoxins and home lasix. Creatine does not appear to be at baseline yet. Will decrease IV fluid as she has received 2L.Marland Kitchen Continue to hold home lasix. Urine output good. Will recheck in the a.m.    Dyspnea: May be multifactorial i.e. Anemia in setting of grade 2 diastolic dysfunction according to echo 2014.  She does not wear chronic oxygen at home. Currently sats 99% on 3L. Will wean and monitor. Will ambulate and check sats. Chest xray on admission with mild vascular congestion. In reviewing old xrays current images seem improved.  Home medications include lasix 40mg  TID which has been on hold.  Will obtain echo to evaluated LV function. Hg still below baseline. Will transfuse another unit PRBC's.  Hyponatremia:  Likely related to dehydration. Much improved.  Will recheck in the a.m.  Part of it is pseudohyponatremia secondary to hyperglycemia, corrected sodium to glucose is 134.  Anemia in chronic renal disease:  Somewhat symptomatic on presentation. S/p 1 unit PRBC's.  Continues with mild increased work of breathing with conversation. In addition patient takes weekly Procrit injections. Her next injection is due June 17.  Dehydration:  Due to decreased by mouth intake and home diuretics. Improved with IV fluids. Not orthostatic. PO intake good  Essential hypertension, benign:  Appears controlled. home medications include losartan and HCTZ we will hold these for now do to #1. Will continue her beta blocker and monitor closely. Provide when necessary hydralazine as indicated  COPD (chronic obstructive pulmonary disease):  Appears stable at baseline. Will continue her home  medications  Atrial flutter:  Chart review indicates recent followup with cardiology. Notes indicate patient has been maintaining normal sinus rhythm. EKG as above.  Morbid obesity:  BMI 34.3. Will obtain nutritional consult  Type 2 diabetes mellitus with diabetic neuropathy:  Will obtain a hemoglobin A1c. Will hold her oral agents for now. We'll provide sliding scale insulin for optimal control.  SLE (systemic lupus erythematosus):  Stable. On treatment with hydroxychloroquine    Code Status: full; Family Communication: husband at bedside Disposition Plan: home hopefully tomorrow   Consultants:  none  Procedures:  none  Antibiotics:  none  HPI/Subjective: Awake alert. Reports feeling better but continues with sob with conversation. Denies cp/palpitation.   Objective: Filed Vitals:   03/14/14 0513  BP: 146/60  Pulse: 69  Temp: 97.2 F (36.2 C)  Resp: 20    Intake/Output Summary (Last 24 hours) at 03/14/14 1329 Last data filed at 03/14/14 1321  Gross per 24 hour  Intake 2216.25 ml  Output    600 ml  Net 1616.25 ml   Filed Weights   03/13/14 1315 03/14/14 0513  Weight: 93.2 kg (205 lb 7.5 oz) 92.761 kg (204 lb 8 oz)    Exam:   General:  Obese appears comfortable  Cardiovascular: RRR +murmur trace LE edema  Respiratory: mild increased work of breathing with conversation. BS without wheeze or rhonchi  Abdomen: obese soft +BS non-tender to palpation  Musculoskeletal: no clubbing or cyanosis   Data Reviewed: Basic Metabolic Panel:  Recent Labs Lab 03/13/14 0851 03/14/14 0622  NA 129* 135*  K 3.7 4.0  CL 92* 101  CO2 21 22  GLUCOSE 323* 198*  BUN  28* 30*  CREATININE 2.41* 2.20*  CALCIUM 8.3* 7.5*   Liver Function Tests:  Recent Labs Lab 03/14/14 0622  AST 20  ALT 8  ALKPHOS 67  BILITOT 0.8  PROT 6.5  ALBUMIN 2.3*   No results found for this basename: LIPASE, AMYLASE,  in the last 168 hours No results found for this basename:  AMMONIA,  in the last 168 hours CBC:  Recent Labs Lab 03/08/14 1244 03/13/14 0851 03/14/14 0622  WBC 3.0* 5.7 7.9  NEUTROABS 2.6 4.1  --   HGB 8.0* 7.9* 8.2*  HCT 24.0* 23.3* 24.4*  MCV 85.7 84.1 83.8  PLT 168 136* 149*   Cardiac Enzymes:  Recent Labs Lab 03/13/14 0845  TROPONINI <0.30   BNP (last 3 results) No results found for this basename: PROBNP,  in the last 8760 hours CBG:  Recent Labs Lab 03/13/14 1147 03/13/14 1711 03/13/14 2150 03/14/14 0727 03/14/14 1105  GLUCAP 290* 256* 146* 180* 181*    No results found for this or any previous visit (from the past 240 hour(s)).   Studies: Dg Chest 2 View  03/13/2014   CLINICAL DATA:  Weakness.  End-stage renal disease.  EXAM: CHEST  2 VIEW  COMPARISON:  09/04/2012  FINDINGS: The heart is enlarged but stable. Stable tortuosity and calcification of the thoracic aorta. Mild vascular congestion but no overt pulmonary edema. No pleural effusions or focal infiltrates. The bony thorax is intact.  IMPRESSION: Stable cardiac enlargement.  Mild vascular congestion but no infiltrates or effusions.   Electronically Signed   By: Kalman Jewels M.D.   On: 03/13/2014 09:55    Scheduled Meds: . aspirin EC  81 mg Oral Daily  . atorvastatin  40 mg Oral q1800  . enoxaparin (LOVENOX) injection  30 mg Subcutaneous Q24H  . folic acid  1 mg Oral Daily  . hydroxychloroquine  200 mg Oral BID  . insulin aspart  0-15 Units Subcutaneous TID WC  . insulin aspart  0-5 Units Subcutaneous QHS  . meloxicam  7.5 mg Oral BID  . [START ON 03/19/2014] methotrexate  10 mg Oral Weekly  . metoprolol  50 mg Oral BID  . predniSONE  2.5 mg Oral Q breakfast  . sodium chloride  3 mL Intravenous Q12H  . Vitamin D (Ergocalciferol)  50,000 Units Oral Q7 days   Continuous Infusions:   Principal Problem:   Renal failure (ARF), acute on chronic Active Problems:   Atrial flutter   Morbid obesity   Type 2 diabetes mellitus with diabetic neuropathy   SLE  (systemic lupus erythematosus)   Essential hypertension, benign   COPD (chronic obstructive pulmonary disease)   Anemia in chronic renal disease   Dehydration   Hyponatremia   Acute on chronic renal failure   Dyspnea    Time spent: [facility] Pager [phone]. If 7PM-7AM, please contact night-coverage at [email or web address], password [facility] 03/14/2014, 1:29 PM  LOS: 1 day

## 2014-03-14 NOTE — Progress Notes (Signed)
Addendum  Patient seen and examined, chart and data base reviewed.  I agree with the above assessment and plan.  For full details please see Mrs. Radene Gunning, NP note.  Acute renal failure in setting of CKD, acute on chronic anemia as well.  Transfuse another unit of packed RBCs, DC IV fluids.   Birdie Hopes, MD [facility] Pager: [phone] 03/14/2014, 2:41 PM

## 2014-03-14 NOTE — Progress Notes (Signed)
Patient place on venturi mask continues to have increased labored respirations at 24. Small tick removed from patient's armpit area with head intact. MD called.

## 2014-03-15 ENCOUNTER — Inpatient Hospital Stay (HOSPITAL_COMMUNITY): Payer: Medicare Other

## 2014-03-15 ENCOUNTER — Ambulatory Visit (HOSPITAL_COMMUNITY): Payer: Medicare Other

## 2014-03-15 ENCOUNTER — Other Ambulatory Visit (HOSPITAL_COMMUNITY): Payer: Medicare Other

## 2014-03-15 DIAGNOSIS — J9601 Acute respiratory failure with hypoxia: Secondary | ICD-10-CM | POA: Diagnosis not present

## 2014-03-15 DIAGNOSIS — J9600 Acute respiratory failure, unspecified whether with hypoxia or hypercapnia: Secondary | ICD-10-CM

## 2014-03-15 DIAGNOSIS — R509 Fever, unspecified: Secondary | ICD-10-CM | POA: Diagnosis not present

## 2014-03-15 DIAGNOSIS — I5032 Chronic diastolic (congestive) heart failure: Secondary | ICD-10-CM | POA: Diagnosis present

## 2014-03-15 LAB — CBC WITH DIFFERENTIAL/PLATELET
Basophils Absolute: 0 10*3/uL (ref 0.0–0.1)
Basophils Relative: 0 % (ref 0–1)
EOS PCT: 0 % (ref 0–5)
Eosinophils Absolute: 0 10*3/uL (ref 0.0–0.7)
HEMATOCRIT: 25.3 % — AB (ref 36.0–46.0)
Hemoglobin: 8.6 g/dL — ABNORMAL LOW (ref 12.0–15.0)
Lymphocytes Relative: 13 % (ref 12–46)
Lymphs Abs: 1.1 10*3/uL (ref 0.7–4.0)
MCH: 28.4 pg (ref 26.0–34.0)
MCHC: 34 g/dL (ref 30.0–36.0)
MCV: 83.5 fL (ref 78.0–100.0)
MONO ABS: 1.2 10*3/uL — AB (ref 0.1–1.0)
Monocytes Relative: 14 % — ABNORMAL HIGH (ref 3–12)
Neutro Abs: 6.5 10*3/uL (ref 1.7–7.7)
Neutrophils Relative %: 73 % (ref 43–77)
Platelets: 160 10*3/uL (ref 150–400)
RBC: 3.03 MIL/uL — AB (ref 3.87–5.11)
RDW: 17 % — AB (ref 11.5–15.5)
WBC: 9 10*3/uL (ref 4.0–10.5)

## 2014-03-15 LAB — TRANSFUSION REACTION
DAT C3: NEGATIVE
Post RXN DAT IgG: NEGATIVE

## 2014-03-15 LAB — URINALYSIS, ROUTINE W REFLEX MICROSCOPIC
Bilirubin Urine: NEGATIVE
Glucose, UA: NEGATIVE mg/dL
Ketones, ur: NEGATIVE mg/dL
Leukocytes, UA: NEGATIVE
Nitrite: NEGATIVE
Protein, ur: 100 mg/dL — AB
Specific Gravity, Urine: 1.025 (ref 1.005–1.030)
UROBILINOGEN UA: 0.2 mg/dL (ref 0.0–1.0)
pH: 5.5 (ref 5.0–8.0)

## 2014-03-15 LAB — TYPE AND SCREEN
ABO/RH(D): A POS
Antibody Screen: NEGATIVE
UNIT DIVISION: 0
Unit division: 0

## 2014-03-15 LAB — SODIUM, URINE, RANDOM: Sodium, Ur: 71 mEq/L

## 2014-03-15 LAB — CREATININE, URINE, RANDOM: Creatinine, Urine: 69.48 mg/dL

## 2014-03-15 LAB — GLUCOSE, CAPILLARY
GLUCOSE-CAPILLARY: 165 mg/dL — AB (ref 70–99)
GLUCOSE-CAPILLARY: 232 mg/dL — AB (ref 70–99)
Glucose-Capillary: 152 mg/dL — ABNORMAL HIGH (ref 70–99)
Glucose-Capillary: 76 mg/dL (ref 70–99)

## 2014-03-15 LAB — BASIC METABOLIC PANEL
BUN: 38 mg/dL — ABNORMAL HIGH (ref 6–23)
CHLORIDE: 99 meq/L (ref 96–112)
CO2: 18 meq/L — AB (ref 19–32)
CREATININE: 2.38 mg/dL — AB (ref 0.50–1.10)
Calcium: 7.2 mg/dL — ABNORMAL LOW (ref 8.4–10.5)
GFR calc Af Amer: 23 mL/min — ABNORMAL LOW (ref >90)
GFR calc non Af Amer: 20 mL/min — ABNORMAL LOW (ref >90)
Glucose, Bld: 222 mg/dL — ABNORMAL HIGH (ref 70–99)
Potassium: 3.8 mEq/L (ref 3.7–5.3)
Sodium: 134 mEq/L — ABNORMAL LOW (ref 137–147)

## 2014-03-15 LAB — PRO B NATRIURETIC PEPTIDE: Pro B Natriuretic peptide (BNP): 33778 pg/mL — ABNORMAL HIGH (ref 0–125)

## 2014-03-15 LAB — SEDIMENTATION RATE: Sed Rate: 119 mm/hr — ABNORMAL HIGH (ref 0–22)

## 2014-03-15 MED ORDER — EPOETIN ALFA 10000 UNIT/ML IJ SOLN
10000.0000 [IU] | INTRAMUSCULAR | Status: DC
  Administered 2014-03-15: 10000 [IU] via SUBCUTANEOUS
  Filled 2014-03-15: qty 1

## 2014-03-15 MED ORDER — DIPHENHYDRAMINE HCL 25 MG PO CAPS: 25.0000 mg | ORAL_CAPSULE | Freq: Every evening | ORAL | Status: DC | PRN

## 2014-03-15 MED ORDER — ZOLPIDEM TARTRATE 5 MG PO TABS
5.0000 mg | ORAL_TABLET | Freq: Every evening | ORAL | Status: DC | PRN
  Administered 2014-03-15 – 2014-03-27 (×10): 5 mg via ORAL
  Filled 2014-03-15 (×10): qty 1

## 2014-03-15 MED ORDER — SODIUM BICARBONATE 650 MG PO TABS
650.0000 mg | ORAL_TABLET | Freq: Three times a day (TID) | ORAL | Status: DC
  Administered 2014-03-15 – 2014-03-18 (×11): 650 mg via ORAL
  Filled 2014-03-15 (×14): qty 1

## 2014-03-15 MED ORDER — INSULIN ASPART 100 UNIT/ML ~~LOC~~ SOLN
0.0000 [IU] | Freq: Three times a day (TID) | SUBCUTANEOUS | Status: DC
  Administered 2014-03-15 – 2014-03-16 (×2): 4 [IU] via SUBCUTANEOUS
  Administered 2014-03-16: 3 [IU] via SUBCUTANEOUS
  Administered 2014-03-16: 4 [IU] via SUBCUTANEOUS
  Administered 2014-03-17 (×2): 3 [IU] via SUBCUTANEOUS
  Administered 2014-03-18: 4 [IU] via SUBCUTANEOUS
  Administered 2014-03-18: 3 [IU] via SUBCUTANEOUS
  Administered 2014-03-19: 11 [IU] via SUBCUTANEOUS
  Administered 2014-03-19 – 2014-03-20 (×2): 20 [IU] via SUBCUTANEOUS
  Administered 2014-03-20: 11 [IU] via SUBCUTANEOUS
  Administered 2014-03-20: 7 [IU] via SUBCUTANEOUS
  Administered 2014-03-21 (×2): 15 [IU] via SUBCUTANEOUS
  Administered 2014-03-22: 3 [IU] via SUBCUTANEOUS
  Administered 2014-03-22: 7 [IU] via SUBCUTANEOUS
  Administered 2014-03-23: 3 [IU] via SUBCUTANEOUS
  Administered 2014-03-23: 4 [IU] via SUBCUTANEOUS

## 2014-03-15 NOTE — Progress Notes (Signed)
UR chart review completed.  

## 2014-03-15 NOTE — Progress Notes (Signed)
[facility] PROGRESS NOTE  [name] [id number] DOB: 1946-04-03 DOA: 03/13/2014 PCP: [name], MD  Assessment/Plan:   Acute respiratory failure with hypoxia Secondary to acute on chronic diastolic heart failure in setting of worsening anemia. Patient given IV fluids for dehydration and transfusions for anemia on admission. Yesterday developed worsening sob with hypoxia. Transferred to ICU on BiPap.   Patient much improved this am. Has been off BiPap for 2 hours. sats >90% on [place].  Echo with grade 4 diastolic dysfunction, BNP elevated, chest xray consistent with CHF. Will continue lasix IV. Monitor intake and output as well as daily weight. Appreciate cardiology assistance.   Renal failure (ARF), acute on chronic:  Likely related to dehydration as a result of decreased by mouth intake and diuretics. Creatinine trending up somewhat likely related to lasix. Urine output good. Will recheck in the a.m.   Fever Temperature 103.1 at end of transfusion yesterday. Likely reaction to blood. Chest xray without infiltrate. Await blood cultures. Urinalysis with many squamous cell and many bacteria, 0-2 WBC. Await urine culture. Vancomycin and zosyn empirically day #2. Monitor.     Hyponatremia:  Likely related to dehydration. Part of it is pseudohyponatremia secondary to hyperglycemia, corrected sodium to glucose is 134. Stable. Monitor  Anemia in chronic renal disease and Lupus:   Somewhat symptomatic on presentation. S/p 2 unit PRBC's since admission. HG 8.6.  In addition patient takes weekly Procrit injections. Her next injection is due June 17. Monitor  Essential hypertension, benign:  Appears controlled. home medications include losartan and HCTZ we will hold these for now. continue her beta blocker and monitor closely. Defer management to cardiology.    COPD (chronic obstructive pulmonary disease):  Appears stable at baseline. Will continue her home medications and  nebs  Atrial flutter:  Chart review indicates recent followup with cardiology. Notes indicate patient has been maintaining normal sinus rhythm. EKG as above.   Morbid obesity:  BMI 34.3. Will obtain nutritional consult   Type 2 diabetes mellitus with diabetic neuropathy:   Uncontrolled. Hemoglobin A1c 6.9. Will hold her oral agents for now. CBG range 152-223. Will increase sliding scale insulin to resistant for optimal control.   SLE (systemic lupus erythematosus):  Stable. On treatment with hydroxychloroquine    Code Status: full Family Communication: husband on phone Disposition Plan: home when ready    Consultants:  cardiology  Procedures:  echo 03/14/14 Normal LV function EF 50-55% with mild to mod LVH,Grade 4 diastolic dysfunction, mod AI, mild MR, mod-severe TR, PA peak pressure 61 mm Hg.   Antibiotics:  Vancomycin 03/14/14>>  Zosyn 03/14/14>>  HPI/Subjective: Awake, sitting up in bed eating. Reports feeling much better this am. Reports breathing easieer  Objective: Filed Vitals:   03/15/14 0800  BP:   Pulse:   Temp: 98.5 F (36.9 C)  Resp:     Intake/Output Summary (Last 24 hours) at 03/15/14 0845 Last data filed at 03/15/14 0600  Gross per 24 hour  Intake   1000 ml  Output   1400 ml  Net   -400 ml   Filed Weights   03/13/14 1315 03/14/14 0513 03/15/14 0500  Weight: 93.2 kg (205 lb 7.5 oz) 92.761 kg (204 lb 8 oz) 97.6 kg (215 lb 2.7 oz)    Exam:   General:  Obese appears comfortable  Cardiovascular: RRR +murmur no gallup no LE edema  Respiratory:  Less dyspneic, mild increased work of breathing with conversation. BS diminished. Very faint crackles bilateral bases. No  wheeze no rhonchi  Abdomen: obese soft +BS non-tender to palpation  Musculoskeletal: no clubbing no cyanosis   Data Reviewed: Basic Metabolic Panel:  Recent Labs Lab 03/13/14 0851 03/14/14 0622 03/15/14 0551  NA 129* 135* 134*  K 3.7 4.0 3.8  CL 92* 101 99  CO2 21 22  18*  GLUCOSE 323* 198* 222*  BUN 28* 30* 38*  CREATININE 2.41* 2.20* 2.38*  CALCIUM 8.3* 7.5* 7.2*   Liver Function Tests:  Recent Labs Lab 03/14/14 0622  AST 20  ALT 8  ALKPHOS 67  BILITOT 0.8  PROT 6.5  ALBUMIN 2.3*   No results found for this basename: LIPASE, AMYLASE,  in the last 168 hours No results found for this basename: AMMONIA,  in the last 168 hours CBC:  Recent Labs Lab 03/08/14 1244 03/13/14 0851 03/14/14 0622 03/15/14 0551  WBC 3.0* 5.7 7.9 9.0  NEUTROABS 2.6 4.1  --  6.5  HGB 8.0* 7.9* 8.2* 8.6*  HCT 24.0* 23.3* 24.4* 25.3*  MCV 85.7 84.1 83.8 83.5  PLT 168 136* 149* 160   Cardiac Enzymes:  Recent Labs Lab 03/13/14 0845 03/14/14 0621  TROPONINI <0.30 <0.30   BNP (last 3 results)  Recent Labs  03/15/14 0551  PROBNP 33778.0*   CBG:  Recent Labs Lab 03/14/14 0727 03/14/14 1105 03/14/14 1615 03/14/14 2000 03/15/14 0753  GLUCAP 180* 181* 174* 223* 152*    Recent Results (from the past 240 hour(s))  URINE CULTURE     Status: None   Collection Time    03/13/14  9:10 AM      Result Value Ref Range Status   Specimen Description URINE, CATHETERIZED   Final   Special Requests NONE   Final   Culture  Setup Time     Final   Value: 03/13/2014 23:47     Performed at [facility] Count     Final   Value: NO GROWTH     Performed at [facility]   Culture     Final   Value: NO GROWTH     Performed at [facility]   Report Status 03/14/2014 FINAL   Final  MRSA PCR SCREENING     Status: None   Collection Time    03/14/14  6:02 PM      Result Value Ref Range Status   MRSA by PCR NEGATIVE  NEGATIVE Final   Comment:            The GeneXpert MRSA Assay (FDA     approved for NASAL specimens     only), is one component of a     comprehensive MRSA colonization     surveillance program. It is not     intended to diagnose MRSA     infection nor to guide or     monitor treatment for     MRSA infections.   CULTURE, BLOOD (ROUTINE X 2)     Status: None   Collection Time    03/14/14  6:59 PM      Result Value Ref Range Status   Specimen Description BLOOD RIGHT ARM   Final   Special Requests     Final   Value: BOTTLES DRAWN AEROBIC AND ANAEROBIC AEB 6CC ANA [place]   Culture PENDING   Incomplete   Report Status PENDING   Incomplete  CULTURE, BLOOD (ROUTINE X 2)     Status: None   Collection Time    03/14/14  7:26  PM      Result Value Ref Range Status   Specimen Description BLOOD LEFT ARM   Final   Special Requests BOTTLES DRAWN AEROBIC ONLY 4CC   Final   Culture PENDING   Incomplete   Report Status PENDING   Incomplete     Studies: Dg Chest 2 View  03/13/2014   CLINICAL DATA:  Weakness.  End-stage renal disease.  EXAM: CHEST  2 VIEW  COMPARISON:  09/04/2012  FINDINGS: The heart is enlarged but stable. Stable tortuosity and calcification of the thoracic aorta. Mild vascular congestion but no overt pulmonary edema. No pleural effusions or focal infiltrates. The bony thorax is intact.  IMPRESSION: Stable cardiac enlargement.  Mild vascular congestion but no infiltrates or effusions.   Electronically Signed   By: Kalman Jewels M.D.   On: 03/13/2014 09:55   Dg Chest Port 1 View  03/14/2014   CLINICAL DATA:  Shortness of breath.  EXAM: PORTABLE CHEST - 1 VIEW  COMPARISON:  03/13/2014  FINDINGS: Chronic cardiopericardial enlargement. There is vascular pedicle widening. There is newly cephalized blood flow and interstitial indistinctness. No asymmetric opacity, definitive effusion, or pneumothorax.  IMPRESSION: CHF.   Electronically Signed   By: Jorje Guild M.D.   On: 03/14/2014 17:55    Scheduled Meds: . albuterol  3 mL Inhalation Q6H  . antiseptic oral rinse  15 mL Mouth Rinse q12n4p  . aspirin EC  81 mg Oral Daily  . atorvastatin  40 mg Oral q1800  . chlorhexidine  15 mL Mouth Rinse BID  . enoxaparin (LOVENOX) injection  30 mg Subcutaneous Q24H  . folic acid  1 mg Oral Daily  .  furosemide  60 mg Intravenous BID  . hydroxychloroquine  200 mg Oral BID  . insulin aspart  0-15 Units Subcutaneous TID WC  . insulin aspart  0-5 Units Subcutaneous QHS  . [START ON 03/19/2014] methotrexate  10 mg Oral Weekly  . metoprolol  50 mg Oral BID  . piperacillin-tazobactam (ZOSYN)  IV  3.375 g Intravenous Q8H  . predniSONE  2.5 mg Oral Q breakfast  . sodium chloride  3 mL Intravenous Q12H  . vancomycin  1,000 mg Intravenous Q24H  . Vitamin D (Ergocalciferol)  50,000 Units Oral Q7 days   Continuous Infusions:   Principal Problem:   Renal failure (ARF), acute on chronic Active Problems:   Atrial flutter   Morbid obesity   Type 2 diabetes mellitus with diabetic neuropathy   SLE (systemic lupus erythematosus)   Essential hypertension, benign   COPD (chronic obstructive pulmonary disease)   Anemia in chronic renal disease   Dehydration   Hyponatremia   Acute on chronic renal failure   Dyspnea   Diastolic CHF, acute on chronic   Acute respiratory failure with hypoxia    Time spent: 35 minutes    [facility] Pager [phone]. If 7PM-7AM, please contact night-coverage at [email or web address], password [facility] 03/15/2014, 8:45 AM  LOS: 2 days

## 2014-03-15 NOTE — Progress Notes (Signed)
Patient seen and examined.  Note reviewed.  She was initially admitted to the hospital with weakness felt to be related to anemia. She was transfused PRBCs was started on intravenous fluids for acute on chronic renal failure. She subsequently to develop worsening shortness of breath and respiratory failure requiring BiPAP. Chest x-ray was consistent with CHF and she was transferred to the step down unit for BiPAP therapy and started on intravenous Lasix. Intake and output record does not indicate significant diuresis. Clinically, the patient does appear to be better. She was weaned off BiPAP and no longer appears to be in respiratory distress. She is currently on 2 L of oxygen. We'll continue to wean off oxygen as tolerated. Continue intravenous Lasix. BNP is markedly elevated at 33,000. Cardiology consult has been requested. Creatinine remains elevated with poor urine output. We'll also request nephrology to follow along.  MEMON,JEHANZEB

## 2014-03-15 NOTE — Consult Note (Signed)
Reason for Consult:CHF Referring Physician: PTH  [name] is an 68 y.o. female.  HPI: This is a 68 year old female patient of Dr. Domenic Polite who has history of atrial flutter status post TEE cardioversion in December 2013([facility]), previously on Xarelto but this was discontinued. She also has history of mild aortic stenosis by echo last year as well as diastolic dysfunction and moderate secondary pulmonary hypertension.  Dr. Domenic Polite had ordered a repeat echo in December 2014 which LV function was stable with diastolic dysfunction and overall mild pulmonary hypertension, better than previously noted there remains moderate AI and mild MR.  Patient has also been short of breath lately  due to her anemia and is followed by oncology.   She comes in with overall weakness and was found to be anemic and dehydrated with hyponatremia.   [id number], hyperglycemia 290, acute on chronic renal failure with a creatinine of 2.41 and anemia with a hemoglobin of 7.9. Chest x-ray showed cardiac enlargement with mild vascular congestion without infiltrates or effusions. EKG normal sinus rhythm. Patient has been transfused 2 units of blood and  became short of breath treated with IV Lasix.CXR: CHF. BNP [id number].Now she is feeling a little better after diuresing. She denies any chest pain, palpitations, dyspnea, dizziness or presyncope. She has chronic dyspnea on exertion felt secondary to anemia. BP has been controlled with Lopressor 55m BID.  2Decho yesterday shows Normal LV function EF 50-55% with mild to mod LVH,Grade 4 diastolic dysfunction, mod AI, mild MR, mod-severe TR, PA peak pressure 61 mm Hg. See below for details.    Past Medical History  Diagnosis Date  . Asthma   . Essential hypertension, benign   . Type 2 diabetes mellitus   . SLE (systemic lupus erythematosus)   . Anemia   . Atrial flutter     TEE/DCCV December 2013 - on Xarelto ([facility]  . CKD (chronic kidney disease) stage 3, GFR 30-59 ml/min      Past Surgical History  Procedure Laterality Date  . Ankle surgery  1993  . Back surgery  1980  . Abdominal hysterectomy  1983  . Tee without cardioversion  09/16/2012    Procedure: TRANSESOPHAGEAL ECHOCARDIOGRAM (TEE);  Surgeon: MSanda Klein MD;  [facility]  Service: Cardiovascular;  Laterality: N/A;  . Cardioversion  09/16/2012    Procedure: CARDIOVERSION;  Surgeon: MSanda Klein MD;  [facility];  Service: Cardiovascular;  Laterality: N/A;  . Esophagogastroduodenoscopy  09/20/2012    Procedure: ESOPHAGOGASTRODUODENOSCOPY (EGD);  Surgeon: RCleotis Nipper MD;  [facility];  Service: Endoscopy;  Laterality: Left;  . Colonoscopy  09/20/2012    Procedure: COLONOSCOPY;  Surgeon: RCleotis Nipper MD;  [facility];  Service: Endoscopy;  Laterality: Left;    Family History  Problem Relation Age of Onset  . Diabetes Sister   . Diabetes Brother   . Diabetes Father   . Hypertension Father   . Hypertension Sister   . Hypertension Brother   . Stroke Mother     Social History:  reports that she has quit smoking. Her smoking use included Cigarettes. She has a 30 pack-year smoking history. She does not have any smokeless tobacco history on file. She reports that she does not drink alcohol or use illicit drugs.  Allergies: No Known Allergies  Medications:  Scheduled Meds: . albuterol  3 mL Inhalation Q6H  . antiseptic oral rinse  15 mL Mouth Rinse q12n4p  . aspirin EC  81 mg Oral Daily  .  atorvastatin  40 mg Oral q1800  . chlorhexidine  15 mL Mouth Rinse BID  . enoxaparin (LOVENOX) injection  30 mg Subcutaneous Q24H  . folic acid  1 mg Oral Daily  . furosemide  60 mg Intravenous BID  . hydroxychloroquine  200 mg Oral BID  . insulin aspart  0-15 Units Subcutaneous TID WC  . insulin aspart  0-5 Units Subcutaneous QHS  . [START ON 03/19/2014] methotrexate  10 mg Oral Weekly  . metoprolol  50 mg Oral BID  . piperacillin-tazobactam  (ZOSYN)  IV  3.375 g Intravenous Q8H  . predniSONE  2.5 mg Oral Q breakfast  . sodium chloride  3 mL Intravenous Q12H  . vancomycin  1,000 mg Intravenous Q24H  . Vitamin D (Ergocalciferol)  50,000 Units Oral Q7 days   Continuous Infusions:  PRN Meds:.acetaminophen, acetaminophen, albuterol, alum & mag hydroxide-simeth, bisacodyl, ondansetron (ZOFRAN) IV, ondansetron   Results for orders placed during the hospital encounter of 03/13/14 (from the past 48 hour(s))  TROPONIN I     Status: None   Collection Time    03/13/14  8:45 AM      Result Value Ref Range   Troponin I <0.30  <0.30 ng/mL   Comment:            Due to the release kinetics of cTnI,     a negative result within the first hours     of the onset of symptoms does not rule out     myocardial infarction with certainty.     If myocardial infarction is still suspected,     repeat the test at appropriate intervals.  HEMOGLOBIN A1C     Status: Abnormal   Collection Time    03/13/14  8:45 AM      Result Value Ref Range   Hemoglobin A1C 6.9 (*) <5.7 %   Comment: (NOTE)                                                                               According to the ADA Clinical Practice Recommendations for 2011, when     HbA1c is used as a screening test:      >=6.5%   Diagnostic of Diabetes Mellitus               (if abnormal result is confirmed)     5.7-6.4%   Increased risk of developing Diabetes Mellitus     References:Diagnosis and Classification of Diabetes Mellitus,Diabetes     HTDS,2876,81(LXBWI 1):S62-S69 and Standards of Medical Care in             Diabetes - 2011,Diabetes Care,2011,34 (Suppl 1):S11-S61.   Mean Plasma Glucose 151 (*) <117 mg/dL   Comment: Performed at [facility]  CBC WITH DIFFERENTIAL     Status: Abnormal   Collection Time    03/13/14  8:51 AM      Result Value Ref Range   WBC 5.7  4.0 - 10.5 K/uL   RBC 2.77 (*) 3.87 - 5.11 MIL/uL   Hemoglobin 7.9 (*) 12.0 - 15.0 g/dL   HCT 23.3 (*)  36.0 - 46.0 %   MCV 84.1  78.0 - 100.0 fL  MCH 28.5  26.0 - 34.0 pg   MCHC 33.9  30.0 - 36.0 g/dL   RDW 16.6 (*) 11.5 - 15.5 %   Platelets 136 (*) 150 - 400 K/uL   Neutro Abs 4.1  1.7 - 7.7 K/uL   Lymphs Abs 0.9  0.7 - 4.0 K/uL   Monocytes Absolute 0.6  0.1 - 1.0 K/uL   Eosinophils Absolute 0  0.0 - 0.7 K/uL   Basophils Absolute 0  0.0 - 0.1 K/uL   Neutrophils Relative % 0 (*) 43 - 77 %   Lymphocytes Relative 0 (*) 12 - 46 %   Monocytes Relative 0 (*) 3 - 12 %   Eosinophils Relative 0  0 - 5 %   Basophils Relative 0  0 - 1 %   Band Neutrophils 0  0 - 10 %   Metamyelocytes Relative 0     Myelocytes 0     Promyelocytes Absolute 0     Blasts 0     RBC Morphology POLYCHROMASIA PRESENT     Comment: SCHISTOCYTES PRESENT (2-5/hpf)   WBC Morphology ATYPICAL LYMPHOCYTES    BASIC METABOLIC PANEL     Status: Abnormal   Collection Time    03/13/14  8:51 AM      Result Value Ref Range   Sodium 129 (*) 137 - 147 mEq/L   Potassium 3.7  3.7 - 5.3 mEq/L   Chloride 92 (*) 96 - 112 mEq/L   CO2 21  19 - 32 mEq/L   Glucose, Bld 323 (*) 70 - 99 mg/dL   BUN 28 (*) 6 - 23 mg/dL   Creatinine, Ser 2.41 (*) 0.50 - 1.10 mg/dL   Calcium 8.3 (*) 8.4 - 10.5 mg/dL   GFR calc non Af Amer 20 (*) >90 mL/min   GFR calc Af Amer 23 (*) >90 mL/min   Comment: (NOTE)     The eGFR has been calculated using the CKD EPI equation.     This calculation has not been validated in all clinical situations.     eGFR's persistently <90 mL/min signify possible Chronic Kidney     Disease.  URINALYSIS, ROUTINE W REFLEX MICROSCOPIC     Status: Abnormal   Collection Time    03/13/14  9:10 AM      Result Value Ref Range   Color, Urine YELLOW  YELLOW   APPearance CLEAR  CLEAR   Specific Gravity, Urine >1.030 (*) 1.005 - 1.030   pH 5.5  5.0 - 8.0   Glucose, UA NEGATIVE  NEGATIVE mg/dL   Hgb urine dipstick TRACE (*) NEGATIVE   Bilirubin Urine SMALL (*) NEGATIVE   Ketones, ur NEGATIVE  NEGATIVE mg/dL   Protein, ur 100 (*)  NEGATIVE mg/dL   Urobilinogen, UA 0.2  0.0 - 1.0 mg/dL   Nitrite NEGATIVE  NEGATIVE   Leukocytes, UA NEGATIVE  NEGATIVE  URINE MICROSCOPIC-ADD ON     Status: Abnormal   Collection Time    03/13/14  9:10 AM      Result Value Ref Range   RBC / HPF 3-6  <3 RBC/hpf   Bacteria, UA MANY (*) RARE  URINE CULTURE     Status: None   Collection Time    03/13/14  9:10 AM      Result Value Ref Range   Specimen Description URINE, CATHETERIZED     Special Requests NONE     Culture  Setup Time       Value: 03/13/2014 23:47  Performed at [facility]       Value: NO GROWTH     Performed at [facility]   Culture       Value: NO GROWTH     Performed at [facility]   Report Status 03/14/2014 FINAL    CBG MONITORING, ED     Status: Abnormal   Collection Time    03/13/14 11:47 AM      Result Value Ref Range   Glucose-Capillary 290 (*) 70 - 99 mg/dL  PREPARE RBC (CROSSMATCH)     Status: None   Collection Time    03/13/14  1:45 PM      Result Value Ref Range   Order Confirmation ORDER PROCESSED BY BLOOD BANK    TYPE AND SCREEN     Status: None   Collection Time    03/13/14  1:45 PM      Result Value Ref Range   ABO/RH(D) A POS     Antibody Screen NEG     Sample Expiration 03/16/2014     Unit Number [id number]     Blood Component Type RED CELLS,LR     Unit division 00     Status of Unit ISSUED,FINAL     Transfusion Status OK TO TRANSFUSE     Crossmatch Result Compatible     Unit Number [id number]     Blood Component Type RBC LR PHER2     Unit division 00     Status of Unit ISSUED     Transfusion Status OK TO TRANSFUSE     Crossmatch Result Compatible    ABO/RH     Status: None   Collection Time    03/13/14  1:45 PM      Result Value Ref Range   ABO/RH(D) A POS    PREPARE RBC (CROSSMATCH)     Status: None   Collection Time    03/13/14  1:45 PM      Result Value Ref Range   Order Confirmation ORDER PROCESSED BY BLOOD BANK     GLUCOSE, CAPILLARY     Status: Abnormal   Collection Time    03/13/14  5:11 PM      Result Value Ref Range   Glucose-Capillary 256 (*) 70 - 99 mg/dL   Comment 1 Notify RN    GLUCOSE, CAPILLARY     Status: Abnormal   Collection Time    03/13/14  9:50 PM      Result Value Ref Range   Glucose-Capillary 146 (*) 70 - 99 mg/dL   Comment 1 Notify RN    TROPONIN I     Status: None   Collection Time    03/14/14  6:21 AM      Result Value Ref Range   Troponin I <0.30  <0.30 ng/mL   Comment:            Due to the release kinetics of cTnI,     a negative result within the first hours     of the onset of symptoms does not rule out     myocardial infarction with certainty.     If myocardial infarction is still suspected,     repeat the test at appropriate intervals.  COMPREHENSIVE METABOLIC PANEL     Status: Abnormal   Collection Time    03/14/14  6:22 AM      Result Value Ref Range   Sodium 135 (*) 137 - 147 mEq/L  Potassium 4.0  3.7 - 5.3 mEq/L   Chloride 101  96 - 112 mEq/L   Comment: DELTA CHECK NOTED   CO2 22  19 - 32 mEq/L   Glucose, Bld 198 (*) 70 - 99 mg/dL   BUN 30 (*) 6 - 23 mg/dL   Creatinine, Ser 2.20 (*) 0.50 - 1.10 mg/dL   Calcium 7.5 (*) 8.4 - 10.5 mg/dL   Total Protein 6.5  6.0 - 8.3 g/dL   Albumin 2.3 (*) 3.5 - 5.2 g/dL   AST 20  0 - 37 U/L   ALT 8  0 - 35 U/L   Alkaline Phosphatase 67  39 - 117 U/L   Total Bilirubin 0.8  0.3 - 1.2 mg/dL   GFR calc non Af Amer 22 (*) >90 mL/min   GFR calc Af Amer 25 (*) >90 mL/min   Comment: (NOTE)     The eGFR has been calculated using the CKD EPI equation.     This calculation has not been validated in all clinical situations.     eGFR's persistently <90 mL/min signify possible Chronic Kidney     Disease.  CBC     Status: Abnormal   Collection Time    03/14/14  6:22 AM      Result Value Ref Range   WBC 7.9  4.0 - 10.5 K/uL   RBC 2.91 (*) 3.87 - 5.11 MIL/uL   Hemoglobin 8.2 (*) 12.0 - 15.0 g/dL   HCT 24.4 (*) 36.0 -  46.0 %   MCV 83.8  78.0 - 100.0 fL   MCH 28.2  26.0 - 34.0 pg   MCHC 33.6  30.0 - 36.0 g/dL   RDW 16.9 (*) 11.5 - 15.5 %   Platelets 149 (*) 150 - 400 K/uL  GLUCOSE, CAPILLARY     Status: Abnormal   Collection Time    03/14/14  7:27 AM      Result Value Ref Range   Glucose-Capillary 180 (*) 70 - 99 mg/dL   Comment 1 Notify RN    GLUCOSE, CAPILLARY     Status: Abnormal   Collection Time    03/14/14 11:05 AM      Result Value Ref Range   Glucose-Capillary 181 (*) 70 - 99 mg/dL   Comment 1 Notify RN    GLUCOSE, CAPILLARY     Status: Abnormal   Collection Time    03/14/14  4:15 PM      Result Value Ref Range   Glucose-Capillary 174 (*) 70 - 99 mg/dL   Comment 1 Notify RN    MRSA PCR SCREENING     Status: None   Collection Time    03/14/14  6:02 PM      Result Value Ref Range   MRSA by PCR NEGATIVE  NEGATIVE   Comment:            The GeneXpert MRSA Assay (FDA     approved for NASAL specimens     only), is one component of a     comprehensive MRSA colonization     surveillance program. It is not     intended to diagnose MRSA     infection nor to guide or     monitor treatment for     MRSA infections.  URINALYSIS, ROUTINE W REFLEX MICROSCOPIC     Status: Abnormal   Collection Time    03/14/14  6:19 PM      Result Value Ref Range   Color, Urine  YELLOW  YELLOW   APPearance HAZY (*) CLEAR   Specific Gravity, Urine 1.025  1.005 - 1.030   pH 5.5  5.0 - 8.0   Glucose, UA NEGATIVE  NEGATIVE mg/dL   Hgb urine dipstick MODERATE (*) NEGATIVE   Bilirubin Urine NEGATIVE  NEGATIVE   Ketones, ur NEGATIVE  NEGATIVE mg/dL   Protein, ur 100 (*) NEGATIVE mg/dL   Urobilinogen, UA 0.2  0.0 - 1.0 mg/dL   Nitrite NEGATIVE  NEGATIVE   Leukocytes, UA NEGATIVE  NEGATIVE  URINE MICROSCOPIC-ADD ON     Status: Abnormal   Collection Time    03/14/14  6:19 PM      Result Value Ref Range   Squamous Epithelial / LPF MANY (*) RARE   WBC, UA 0-2  <3 WBC/hpf   RBC / HPF 0-2  <3 RBC/hpf    Bacteria, UA MANY (*) RARE   Casts HYALINE CASTS (*) NEGATIVE  CULTURE, BLOOD (ROUTINE X 2)     Status: None   Collection Time    03/14/14  6:59 PM      Result Value Ref Range   Specimen Description BLOOD RIGHT ARM     Special Requests       Value: BOTTLES DRAWN AEROBIC AND ANAEROBIC AEB 6CC ANA [place]   Culture PENDING     Report Status PENDING    TRANSFUSION REACTION     Status: None   Collection Time    03/14/14  6:59 PM      Result Value Ref Range   Post RXN DAT IgG NEG     DAT C3       Value: NEG     Performed at [facility] interp tx rxn PENDING    CULTURE, BLOOD (ROUTINE X 2)     Status: None   Collection Time    03/14/14  7:26 PM      Result Value Ref Range   Specimen Description BLOOD LEFT ARM     Special Requests BOTTLES DRAWN AEROBIC ONLY 4CC     Culture PENDING     Report Status PENDING    GLUCOSE, CAPILLARY     Status: Abnormal   Collection Time    03/14/14  8:00 PM      Result Value Ref Range   Glucose-Capillary 223 (*) 70 - 99 mg/dL  BASIC METABOLIC PANEL     Status: Abnormal   Collection Time    03/15/14  5:51 AM      Result Value Ref Range   Sodium 134 (*) 137 - 147 mEq/L   Potassium 3.8  3.7 - 5.3 mEq/L   Chloride 99  96 - 112 mEq/L   CO2 18 (*) 19 - 32 mEq/L   Glucose, Bld 222 (*) 70 - 99 mg/dL   BUN 38 (*) 6 - 23 mg/dL   Creatinine, Ser 2.38 (*) 0.50 - 1.10 mg/dL   Calcium 7.2 (*) 8.4 - 10.5 mg/dL   GFR calc non Af Amer 20 (*) >90 mL/min   GFR calc Af Amer 23 (*) >90 mL/min   Comment: (NOTE)     The eGFR has been calculated using the CKD EPI equation.     This calculation has not been validated in all clinical situations.     eGFR's persistently <90 mL/min signify possible Chronic Kidney     Disease.  CBC WITH DIFFERENTIAL     Status: Abnormal   Collection Time    03/15/14  5:51 AM  Result Value Ref Range   WBC 9.0  4.0 - 10.5 K/uL   RBC 3.03 (*) 3.87 - 5.11 MIL/uL   Hemoglobin 8.6 (*) 12.0 - 15.0 g/dL   HCT 25.3 (*) 36.0  - 46.0 %   MCV 83.5  78.0 - 100.0 fL   MCH 28.4  26.0 - 34.0 pg   MCHC 34.0  30.0 - 36.0 g/dL   RDW 17.0 (*) 11.5 - 15.5 %   Platelets 160  150 - 400 K/uL   Neutrophils Relative % 73  43 - 77 %   Neutro Abs 6.5  1.7 - 7.7 K/uL   Lymphocytes Relative 13  12 - 46 %   Lymphs Abs 1.1  0.7 - 4.0 K/uL   Monocytes Relative 14 (*) 3 - 12 %   Monocytes Absolute 1.2 (*) 0.1 - 1.0 K/uL   Eosinophils Relative 0  0 - 5 %   Eosinophils Absolute 0.0  0.0 - 0.7 K/uL   Basophils Relative 0  0 - 1 %   Basophils Absolute 0.0  0.0 - 0.1 K/uL  PRO B NATRIURETIC PEPTIDE     Status: Abnormal   Collection Time    03/15/14  5:51 AM      Result Value Ref Range   Pro B Natriuretic peptide (BNP) 33778.0 (*) 0 - 125 pg/mL  GLUCOSE, CAPILLARY     Status: Abnormal   Collection Time    03/15/14  7:53 AM      Result Value Ref Range   Glucose-Capillary 152 (*) 70 - 99 mg/dL    Dg Chest 2 View  03/13/2014   CLINICAL DATA:  Weakness.  End-stage renal disease.  EXAM: CHEST  2 VIEW  COMPARISON:  09/04/2012  FINDINGS: The heart is enlarged but stable. Stable tortuosity and calcification of the thoracic aorta. Mild vascular congestion but no overt pulmonary edema. No pleural effusions or focal infiltrates. The bony thorax is intact.  IMPRESSION: Stable cardiac enlargement.  Mild vascular congestion but no infiltrates or effusions.   Electronically Signed   By: Kalman Jewels M.D.   On: 03/13/2014 09:55   Dg Chest Port 1 View  03/14/2014   CLINICAL DATA:  Shortness of breath.  EXAM: PORTABLE CHEST - 1 VIEW  COMPARISON:  03/13/2014  FINDINGS: Chronic cardiopericardial enlargement. There is vascular pedicle widening. There is newly cephalized blood flow and interstitial indistinctness. No asymmetric opacity, definitive effusion, or pneumothorax.  IMPRESSION: CHF.   Electronically Signed   By: Jorje Guild M.D.   On: 03/14/2014 17:55    ROS See HPI Eyes: Negative Ears:Negative for hearing loss,  tinnitus Cardiovascular: Negative for chest pain, palpitations,irregular heartbeat,near-syncope, orthopnea, paroxysmal nocturnal dyspnea and syncope,edema, claudication, cyanosis,.  Respiratory:   Negative for cough, hemoptysis,sleep disturbances due to breathing, sputum production and wheezing.   Endocrine: Negative for cold intolerance and heat intolerance.  Hematologic/Lymphatic: Chronic anemia.Negative for adenopathy and bleeding problem. Does not bruise/bleed easily.  Musculoskeletal: Negative.   Gastrointestinal: Decreased appetite.Negative for nausea, vomiting, reflux, abdominal pain, diarrhea, constipation.   Genitourinary: Negative for bladder incontinence, dysuria, flank pain, frequency, hematuria, hesitancy, nocturia and urgency.  Neurological: Negative.  Allergic/Immunologic: Negative for environmental allergies.  Blood pressure 145/77, pulse 40, temperature 97.8 F (36.6 C), temperature source Axillary, resp. rate 21, height '5\' 5"'  (1.651 m), weight 215 lb 2.7 oz (97.6 kg), SpO2 98.00%. Physical Exam PHYSICAL EXAM: Well-nournished, in no acute distress. Neck: Increased JVD, HJR, no Bruit, or thyroid enlargement Lungs: Decreased breath sounds with crackles  at the bases.No tachypnea, wheezing,or rhonchi Cardiovascular: RRR, postive S4, 2/6 systolic murmur LSB, no bruit, thrill, or heave. Abdomen: BS normal. Soft without organomegaly, masses, lesions or tenderness. Extremities: without cyanosis, clubbing or edema. Good distal pulses bilateral SKin: Warm, no lesions or rashes  Musculoskeletal: No deformities Neuro: no focal signs   2Decho 03/14/14:Study Conclusions  - Left ventricle: The cavity size was normal. Wall thickness was   increased increased in a pattern of mild to moderate LVH.   Systolic function was normal. The estimated ejection fraction was   in the range of 50% to 55%. Doppler parameters are consistent   with an irreversible restrictive pattern, indicative of  decreased   left ventricular diastolic compliance and/or increased left   atrial pressure (grade 4 diastolic dysfunction). - Aortic valve: Mildly calcified annulus. Trileaflet; mildly   thickened leaflets. There was moderate regurgitation.   Regurgitation pressure half-time: 324 ms. - Aortic root: The aortic root was normal in size. - Mitral valve: Mildly calcified annulus. Moderately thickened   leaflets . There was mild regurgitation. - Left atrium: The atrium was moderately dilated. - Pulmonary veins: There is systolic blunting of pulmonary vein   flow suggestive of elevated LA pressure. - Right ventricle: The cavity size was mildly to moderately   dilated. - Right atrium: The atrium was moderately dilated. - Tricuspid valve: There was moderate-severe regurgitation. The jet   is eccentric and posterior, this may lead to underestimation of   severity. The TR vena contracta measures 0.4 cm. There is   systolic reversal of hepatic vein flow suggestive of severe TR.   The morphological etiology of the TR is unclear from available   images. - Pulmonary arteries: Systolic pressure was moderately increased.   PA peak pressure: 61 mm Hg (S). Pulmonary pressure may be   underestimated in the setting of significant TR. - Inferior vena cava: The vessel was dilated. The respirophasic   diameter changes were blunted (< 50%), consistent with elevated   central venous pressure. - Technically adequate study.     Assessment/Plan: Anemia of chronic disease secondary to Lupus and CKD.  Acute on Chronic Diastolic CHF secondary to blood transfusions and IV fluids, now feeling better after diuresing. Grade 4 diastolic dysfunction on 2Decho yesterday. Continue IV Lasix 11m BID.Just received am dose. Hopefully will diurese more. Was supposedly on 40 mg TID at home but says she only takes 40 mg once daily. Watch renal function closely.  Atrial Flutter: status post TEE cardioversion in December  2013([facility]),maintaining NSR  Hypertension: controlled  Diabetes mellitus, type II  COPD  CKD: ? Renal consult.  Lupus    MErmalinda Barrios6/17/2015, 8:20 AM   Patient seen and discussed with PA LBonnell Public I agree with her documentation above. 68yo female hx of DM2, HTN, COPD, anemia, CKD stage III, aflutter admitted with generalized weakness and SOB. She had multiple metabolic abnormalities including hyponatremia with Na 129, AKI on CKD, and anemia with Hgb of 7.9. She was thought to be dehydrated and received IVF and was also transfused pRBCs. On 03/14/14 patient developed worsening SOB requiring transfer to ICU and being placed on Bipap. CXR showed pulmonary edema. She was given IV lasix with improvement of symptoms and now is weaned off bipap.   Pro-BNP 33,778, WBC 9, Hgb 8.6, plt 160, K 3.8, Cr 2.38, BUN 38. Cr on 02/16/14 reported at 1.20 and BUN 18.   EKG 03/13/14 NSR, prolonged QTc by my measurement of 498.  Echo shows  normal LVEF 50-55%, restrictive diastolic function, moderate AI, mild MR, mod to severe TR, with moderately elevated PASP of 61 mmHg. Suspect her pulm HTN and likely TR is related to left sided heart disease. Her dilated IVC is consistent with elevated RA pressures and elevated CVP consistent with hypervolemia. Follow PA pressures overtime, may need further workup at some point given her hx of SLE and potential risk for primary pulm HTN.   She was received approx 1.5 liters of IVF as well as 2 units of blood per report, output is not recorded. Scenario consistent with decompensated diastolic heart failure in the setting of IVF and blood transfusion. Her AKI remains unclear, her volume overload is not consistent with prerenal as her labs may suggest. Defer AKI workup to primary team. Continue IV lasix today, follow renal function closely  Carlyle Dolly MD

## 2014-03-15 NOTE — Progress Notes (Signed)
PT Cancellation Note  Patient Details Name: [name] MRN: [id number] DOB: 1946/08/16   Cancelled Treatment:    Reason Eval/Treat Not Completed: Patient declined, no reason specified;Other (comment) (Will need new order to re-start PT secondary decline)   WOODWORTH,STEPHANIE 03/15/2014, 8:35 AM

## 2014-03-15 NOTE — Consult Note (Signed)
[name] MRN: [id number] DOB/AGE: 04-12-1946 68 y.o. Primary Care Physician:GOLDING, Betsy Coder, MD Admit date: 03/13/2014 Chief Complaint:  Chief Complaint  Patient presents with  . Weakness   HPI: Pt is 68 year old female with past medical hx of  DM 2 who presented to er with c/o weakness.  HPI dates back to 2-3 days ago when pt started ffeling wekn. It was getting progressively worse. Pt also c/o dyspnea on exertion. Pt also c/o decreased appetite. On evaluation pt was found to have Hyponatremia, anemia and renal failure. Pt was admitted for further care. Pt was transfused PRBCs and  Was started on intravenous  Pt later decompensated and was started on Bipap. Pt now off BiPAP. Pt today feels better. Pt offers no c/o chest pain No c/o hematuria NO c/o fever. No c/o abdominal pain.    Past Medical History  Diagnosis Date  . Asthma   . Essential hypertension, benign   . Type 2 diabetes mellitus   . SLE (systemic lupus erythematosus)   . Anemia   . Atrial flutter     TEE/DCCV December 2013 - on Xarelto [facility])  . CKD (chronic kidney disease) stage 3, GFR 30-59 ml/min         Family History  Problem Relation Age of Onset  . Diabetes Sister   . Diabetes Brother   . Diabetes Father   . Hypertension Father   . Hypertension Sister   . Hypertension Brother   . Stroke Mother   NO hx of ESRD    Social History:  reports that she has quit smoking. Her smoking use included Cigarettes. She has a 30 pack-year smoking history. She does not have any smokeless tobacco history on file. She reports that she does not drink alcohol or use illicit drugs.   Allergies: No Known Allergies  Medications Prior to Admission  Medication Sig Dispense Refill  . acetaminophen (TYLENOL) 500 MG tablet Take 1,000 mg by mouth every 6 (six) hours as needed. For pain      . albuterol (PROVENTIL HFA;VENTOLIN HFA) 108 (90 BASE) MCG/ACT inhaler Inhale 2 puffs into the lungs every 6  (six) hours as needed. For shortness of breath      . aspirin EC 81 MG tablet Take 81 mg by mouth daily.      . folic acid (FOLVITE) 1 MG tablet Take 1 mg by mouth daily.      . furosemide (LASIX) 20 MG tablet Take 40 mg by mouth 3 (three) times daily as needed for fluid.       . hydroxychloroquine (PLAQUENIL) 200 MG tablet Take 200 mg by mouth 2 (two) times daily.      Marland Kitchen losartan-hydrochlorothiazide (HYZAAR) 100-25 MG per tablet Take 1 tablet by mouth daily.      . meloxicam (MOBIC) 7.5 MG tablet Take 7.5 mg by mouth 2 (two) times daily.      . metFORMIN (GLUCOPHAGE) 500 MG tablet Take by mouth 2 (two) times daily with a meal.      . methotrexate (RHEUMATREX) 2.5 MG tablet Take 10 mg by mouth once a week. Caution:Chemotherapy. Protect from light.      . metoprolol (LOPRESSOR) 50 MG tablet Take 1 tablet (50 mg total) by mouth 2 (two) times daily.  60 tablet  11  . Multiple Vitamins-Minerals (CENTRUM SILVER PO) Take 1 tablet by mouth daily.      . predniSONE (DELTASONE) 2.5 MG tablet Take 2.5 mg by mouth daily with breakfast.      .  saxagliptin HCl (ONGLYZA) 2.5 MG TABS tablet Take 2.5 mg by mouth daily.      . simvastatin (ZOCOR) 80 MG tablet Take 80 mg by mouth daily.      . Vitamin D, Ergocalciferol, (DRISDOL) 50000 UNITS CAPS capsule Take 50,000 Units by mouth every 7 (seven) days. Tuesday.           EPP:IRJJO from the symptoms mentioned above,there are no other symptoms referable to all systems reviewed.  Marland Kitchen albuterol  3 mL Inhalation Q6H  . antiseptic oral rinse  15 mL Mouth Rinse q12n4p  . aspirin EC  81 mg Oral Daily  . atorvastatin  40 mg Oral q1800  . chlorhexidine  15 mL Mouth Rinse BID  . enoxaparin (LOVENOX) injection  30 mg Subcutaneous Q24H  . folic acid  1 mg Oral Daily  . furosemide  60 mg Intravenous BID  . hydroxychloroquine  200 mg Oral BID  . insulin aspart  0-20 Units Subcutaneous TID WC  . insulin aspart  0-5 Units Subcutaneous QHS  . [START ON 03/19/2014]  methotrexate  10 mg Oral Weekly  . metoprolol  50 mg Oral BID  . piperacillin-tazobactam (ZOSYN)  IV  3.375 g Intravenous Q8H  . predniSONE  2.5 mg Oral Q breakfast  . sodium chloride  3 mL Intravenous Q12H  . vancomycin  1,000 mg Intravenous Q24H  . Vitamin D (Ergocalciferol)  50,000 Units Oral Q7 days      Physical Exam: Vital signs in last 24 hours: Temp:  [97.8 F (36.6 C)-103.1 F (39.5 C)] 98.5 F (36.9 C) (06/17 0800) Pulse Rate:  [40-126] 76 (06/17 1100) Resp:  [16-39] 24 (06/17 1200) BP: (105-180)/(48-91) 105/70 mmHg (06/17 1200) SpO2:  [83 %-100 %] 99 % (06/17 1504) FiO2 (%):  [40 %] 40 % (06/17 0714) Weight:  [215 lb 2.7 oz (97.6 kg)] 215 lb 2.7 oz (97.6 kg) (06/17 0500) Weight change: 9 lb 11.2 oz (4.4 kg) Last BM Date: 03/12/14  Intake/Output from previous day: 06/16 0701 - 06/17 0700 In: 1000 [P.O.:700; IV Piggyback:300] Out: 1400 [Urine:1400] Total I/O In: 650 [P.O.:600; IV Piggyback:50] Out: -    Physical Exam: General- pt is awake,alert, oriented to time place and person Resp- No acute REsp distress, CTA B/L NO Rhonchi CVS- S1S2 regular ij rate and rhythm GIT- BS+, soft, NT, ND EXT- NO LE Edema, Cyanosis CNS- CN 2-12 grossly intact. Moving all 4 extremities Psych- normal mood and affect    Lab Results: CBC  Recent Labs  03/14/14 0622 03/15/14 0551  WBC 7.9 9.0  HGB 8.2* 8.6*  HCT 24.4* 25.3*  PLT 149* 160    BMET  Recent Labs  03/14/14 0622 03/15/14 0551  NA 135* 134*  K 4.0 3.8  CL 101 99  CO2 22 18*  GLUCOSE 198* 222*  BUN 30* 38*  CREATININE 2.20* 2.38*  CALCIUM 7.5* 7.2*   Trend  Creat 2015   2.4=>2.2==>2.38 2014   1.2--1.4 2013   1.1--1.6       MICRO Recent Results (from the past 240 hour(s))  URINE CULTURE     Status: None   Collection Time    03/13/14  9:10 AM      Result Value Ref Range Status   Specimen Description URINE, CATHETERIZED   Final   Special Requests NONE   Final   Culture  Setup Time      Final   Value: 03/13/2014 23:47     Performed at [facility]  Colony Count     Final   Value: NO GROWTH     Performed at [facility]   Culture     Final   Value: NO GROWTH     Performed at [facility]   Report Status 03/14/2014 FINAL   Final  MRSA PCR SCREENING     Status: None   Collection Time    03/14/14  6:02 PM      Result Value Ref Range Status   MRSA by PCR NEGATIVE  NEGATIVE Final   Comment:            The GeneXpert MRSA Assay (FDA     approved for NASAL specimens     only), is one component of a     comprehensive MRSA colonization     surveillance program. It is not     intended to diagnose MRSA     infection nor to guide or     monitor treatment for     MRSA infections.  CULTURE, BLOOD (ROUTINE X 2)     Status: None   Collection Time    03/14/14  6:59 PM      Result Value Ref Range Status   Specimen Description BLOOD RIGHT ARM   Final   Special Requests     Final   Value: BOTTLES DRAWN AEROBIC AND ANAEROBIC AEB 6CC ANA [place]   Culture NO GROWTH 1 DAY   Final   Report Status PENDING   Incomplete  CULTURE, BLOOD (ROUTINE X 2)     Status: None   Collection Time    03/14/14  7:26 PM      Result Value Ref Range Status   Specimen Description BLOOD LEFT ARM   Final   Special Requests BOTTLES DRAWN AEROBIC ONLY 4CC   Final   Culture NO GROWTH 1 DAY   Final   Report Status PENDING   Incomplete      Lab Results  Component Value Date   CALCIUM 7.2* 03/15/2014      Impression: 1)Renal  AKI secondary to Prerenal/ATN                AKI on CKD                AKI sec to ARB + 2 diuretics + NSAIDS + Methotrexate + Intravascular volume depletion with poor po intake and Anemia                                  CKD stage 3               CKD since 2013               CKD secondary to  DM type 2                                               SLE ?- NO hematuria                                                HTN                Progression  of CKD  marked with AKI                Proteinura will check.                Hematuria Absent   2)HTN Target Organ damage  CKD Medication- On Diuretics- On Beta blockers    3)Anemia HGb at goal (9--11) GI work up -hx of Tubular adenoma in 2013 An of ch disease  On epo as outpt -20K q weekly  4)CKD Mineral-Bone Disorder PTH not avail . Secondary Hyperparathyroidism w/u pending . Phosphorus will check   5)CHF- Cardiology and primary team folowing   6)Electrolytes Normokalemic  Hyponatremic stable  7)Acid base Co2 NOT at goal     Plan:  Will ask for renal u/s Will aks for FENA Will ask for ANA, ds DNA Will ask for complements level to see if SLE is active Will check ESR  Will ask for 24 hr protein Will suggest to d/c Methotrexate Agree with diuresis  Agree wth holding ARB Agree with Holding NSAIDS Will suggest to follow Vanco levels Will start po bicarb Will start epo     BHUTANI,MANPREET S 03/15/2014, 3:55 PM

## 2014-03-16 ENCOUNTER — Inpatient Hospital Stay (HOSPITAL_COMMUNITY): Payer: Medicare Other

## 2014-03-16 DIAGNOSIS — I5033 Acute on chronic diastolic (congestive) heart failure: Principal | ICD-10-CM

## 2014-03-16 DIAGNOSIS — I509 Heart failure, unspecified: Secondary | ICD-10-CM

## 2014-03-16 LAB — ANTI-DNA ANTIBODY, DOUBLE-STRANDED: ds DNA Ab: 7 IU/mL — ABNORMAL HIGH

## 2014-03-16 LAB — ANCA TITERS: Atypical P-ANCA titer: 1:640 {titer} — ABNORMAL HIGH

## 2014-03-16 LAB — CBC
HCT: 24.4 % — ABNORMAL LOW (ref 36.0–46.0)
Hemoglobin: 8.5 g/dL — ABNORMAL LOW (ref 12.0–15.0)
MCH: 29.1 pg (ref 26.0–34.0)
MCHC: 34.8 g/dL (ref 30.0–36.0)
MCV: 83.6 fL (ref 78.0–100.0)
PLATELETS: 184 10*3/uL (ref 150–400)
RBC: 2.92 MIL/uL — ABNORMAL LOW (ref 3.87–5.11)
RDW: 17.6 % — AB (ref 11.5–15.5)
WBC: 8.6 10*3/uL (ref 4.0–10.5)

## 2014-03-16 LAB — ANTI-NUCLEAR AB-TITER (ANA TITER): ANA Titer 1: 1:640 {titer} — ABNORMAL HIGH

## 2014-03-16 LAB — C3 COMPLEMENT: C3 Complement: 82 mg/dL — ABNORMAL LOW (ref 90–180)

## 2014-03-16 LAB — ANCA SCREEN W REFLEX TITER
Atypical p-ANCA Screen: POSITIVE — AB
c-ANCA Screen: NEGATIVE
p-ANCA Screen: NEGATIVE

## 2014-03-16 LAB — GLUCOSE, CAPILLARY
GLUCOSE-CAPILLARY: 143 mg/dL — AB (ref 70–99)
GLUCOSE-CAPILLARY: 167 mg/dL — AB (ref 70–99)
GLUCOSE-CAPILLARY: 113 mg/dL — AB (ref 70–99)
Glucose-Capillary: 174 mg/dL — ABNORMAL HIGH (ref 70–99)

## 2014-03-16 LAB — C4 COMPLEMENT: Complement C4, Body Fluid: 28 mg/dL (ref 10–40)

## 2014-03-16 LAB — URINE CULTURE
Colony Count: NO GROWTH
Culture: NO GROWTH

## 2014-03-16 LAB — BASIC METABOLIC PANEL
BUN: 42 mg/dL — ABNORMAL HIGH (ref 6–23)
CALCIUM: 7.4 mg/dL — AB (ref 8.4–10.5)
CHLORIDE: 95 meq/L — AB (ref 96–112)
CO2: 20 mEq/L (ref 19–32)
Creatinine, Ser: 2.71 mg/dL — ABNORMAL HIGH (ref 0.50–1.10)
GFR calc non Af Amer: 17 mL/min — ABNORMAL LOW (ref >90)
GFR, EST AFRICAN AMERICAN: 20 mL/min — AB (ref >90)
Glucose, Bld: 225 mg/dL — ABNORMAL HIGH (ref 70–99)
Potassium: 3.8 mEq/L (ref 3.7–5.3)
SODIUM: 131 meq/L — AB (ref 137–147)

## 2014-03-16 LAB — MPO/PR-3 (ANCA) ANTIBODIES
Myeloperoxidase Abs: <1
Serine Protease 3: <1

## 2014-03-16 LAB — ANA: Anti Nuclear Antibody(ANA): POSITIVE — AB

## 2014-03-16 LAB — PHOSPHORUS: Phosphorus: 2.7 mg/dL (ref 2.3–4.6)

## 2014-03-16 MED ORDER — INSULIN ASPART 100 UNIT/ML ~~LOC~~ SOLN
2.0000 [IU] | Freq: Three times a day (TID) | SUBCUTANEOUS | Status: DC
  Administered 2014-03-16 – 2014-03-23 (×15): 2 [IU] via SUBCUTANEOUS

## 2014-03-16 MED ORDER — SODIUM CHLORIDE 0.9 % IV SOLN
INTRAVENOUS | Status: DC
  Administered 2014-03-16 – 2014-03-17 (×2): via INTRAVENOUS

## 2014-03-16 MED ORDER — LEVOFLOXACIN IN D5W 750 MG/150ML IV SOLN
750.0000 mg | INTRAVENOUS | Status: DC
  Filled 2014-03-16: qty 150

## 2014-03-16 MED ORDER — LEVOFLOXACIN IN D5W 750 MG/150ML IV SOLN
750.0000 mg | Freq: Once | INTRAVENOUS | Status: AC
  Administered 2014-03-16: 750 mg via INTRAVENOUS
  Filled 2014-03-16: qty 150

## 2014-03-16 NOTE — Progress Notes (Signed)
Report called to L.Covington,RN. Patient transferred to 319 in stable condition via wheelchair.

## 2014-03-16 NOTE — Progress Notes (Signed)
Subjective: Interval History: has no complaint of or vomiting. Patient denies also any difficulty in breathing. Presently she offers no complaints..  Objective: Vital signs in last 24 hours: Temp:  [98.8 F (37.1 C)-101.5 F (38.6 C)] 98.8 F (37.1 C) (06/18 0400) Pulse Rate:  [74-76] 76 (06/17 1100) Resp:  [18-35] 18 (06/18 0500) BP: (88-181)/(33-88) 156/56 mmHg (06/18 0500) SpO2:  [98 %-100 %] 98 % (06/18 0717) Weight:  [94.7 kg (208 lb 12.4 oz)] 94.7 kg (208 lb 12.4 oz) (06/18 0500) Weight change: -2.9 kg (-6 lb 6.3 oz)  Intake/Output from previous day: 06/17 0701 - 06/18 0700 In: 1890 [P.O.:840; IV Piggyback:50] Out: 1000 [Urine:1000] Intake/Output this shift:    General appearance: alert, cooperative and no distress Resp: clear to auscultation bilaterally Cardio: regular rate and rhythm, S1, S2 normal, no murmur, click, rub or gallop GI: soft, non-tender; bowel sounds normal; no masses,  no organomegaly Extremities: extremities normal, atraumatic, no cyanosis or edema  Lab Results:  Recent Labs  03/15/14 0551 03/16/14 0728  WBC 9.0 8.6  HGB 8.6* 8.5*  HCT 25.3* 24.4*  PLT 160 184   BMET:  Recent Labs  03/15/14 0551 03/16/14 0728  NA 134* 131*  K 3.8 3.8  CL 99 95*  CO2 18* 20  GLUCOSE 222* 225*  BUN 38* 42*  CREATININE 2.38* 2.71*  CALCIUM 7.2* 7.4*   No results found for this basename: PTH,  in the last 72 hours Iron Studies: No results found for this basename: IRON, TIBC, TRANSFERRIN, FERRITIN,  in the last 72 hours  Studies/Results: US Renal  03/15/2014   CLINICAL DATA:  Assess for renal size, hypertension.  EXAM: RENAL/URINARY TRACT ULTRASOUND COMPLETE  COMPARISON:  CT abdomen and pelvis January 11, 2004  FINDINGS: Right Kidney:  Length: 11.9 cm. Echogenicity within normal limits. No hydronephrosis visualized. There is a 3.2 x 2.9 x 3 cm anechoic cyst in the upper pole right kidney.  Left Kidney:  Length: 13.2 cm. Echogenicity within normal limits. No  mass visualized. There is dilated calyx in the midpole left kidney. There is no hydronephrosis.  Bladder:  Not visualized due to overlying bowel gas and patient's body habitus per ultrasound technologist.  IMPRESSION: Bilateral normal renal size. Focal dilated calyx in the midpole left kidney without hydronephrosis.   Electronically Signed   By: Abelardo Diesel M.D.   On: 03/15/2014 17:34   Dg Chest Port 1 View  03/15/2014   CLINICAL DATA:  Short of breath.  EXAM: PORTABLE CHEST - 1 VIEW  COMPARISON:  03/14/2014  FINDINGS: Mild enlargement of the cardiopericardial silhouette, stable. Central vascular congestion and hazy perihilar airspace opacity as well as central interstitial thickening is stable consistent with congestive heart failure and pulmonary edema.  No pneumothorax.  IMPRESSION: No change from the prior study. Findings remain consistent with mild congestive heart failure.   Electronically Signed   By: Lajean Manes M.D.   On: 03/15/2014 10:16   Dg Chest Port 1 View  03/14/2014   CLINICAL DATA:  Shortness of breath.  EXAM: PORTABLE CHEST - 1 VIEW  COMPARISON:  03/13/2014  FINDINGS: Chronic cardiopericardial enlargement. There is vascular pedicle widening. There is newly cephalized blood flow and interstitial indistinctness. No asymmetric opacity, definitive effusion, or pneumothorax.  IMPRESSION: CHF.   Electronically Signed   By: Jorje Guild M.D.   On: 03/14/2014 17:55    I have reviewed the patient's current medications.  Assessment/Plan: Problem #1 acute kidney injury: This is thought to be  multifactorial including prerenal syndrome/ARBS/non-steroidal/methotrexate. Her BUN and creatinine at this moment seems to be increasing. Problem #2 history of diabetes Problem #3 hypertension her blood pressure seems to be reasonably controlled Problem #4 history of chronic renal failure stage III: Multifactorial including diabetes/hypertension/presently cannot exclude lupus nephritis. Her  ultrasound does not show any hydro-or kidney stone. Problem #5 history of lupus. Presently workup is pending.  Problem #6 anemia  Problem #7 COPD  Problem #8 atrial flutter. Plan: Will start patient on normal saline a 75 cc per hour We'll continue with Lasix We'll check iron studies, basic metabolic panel and phosphorus in the morning.   LOS: 3 days   Cedarius Kersh S 03/16/2014,8:26 AM

## 2014-03-16 NOTE — Progress Notes (Signed)
[place] for Levaquin  Indication: ?PNA, febrile illness   No Known Allergies  Patient Measurements: Height: 5\' 5"  (165.1 cm) Weight: 208 lb 12.4 oz (94.7 kg) IBW/kg (Calculated) : 57  Vital Signs: Temp: 98.8 F (37.1 C) (06/18 0400) Temp src: Oral (06/18 0400) BP: 156/56 mmHg (06/18 0500) Intake/Output from previous day: 06/17 0701 - 06/18 0700 In: 1890 [P.O.:840; IV Piggyback:50] Out: 1000 [Urine:1000] Intake/Output from this shift:    Labs:  Recent Labs  03/14/14 0622 03/15/14 0551 03/15/14 1731 03/16/14 0728  WBC 7.9 9.0  --  8.6  HGB 8.2* 8.6*  --  8.5*  PLT 149* 160  --  184  LABCREA  --   --  69.48  --   CREATININE 2.20* 2.38*  --  2.71*   Estimated Creatinine Clearance: 22.6 ml/min (by C-G formula based on Cr of 2.71). No results found for this basename: Letta Median, VANCORANDOM, GENTTROUGH, GENTPEAK, GENTRANDOM, TOBRATROUGH, TOBRAPEAK, TOBRARND, AMIKACINPEAK, AMIKACINTROU, AMIKACIN,  in the last 72 hours   Microbiology: Recent Results (from the past 720 hour(s))  URINE CULTURE     Status: None   Collection Time    03/13/14  9:10 AM      Result Value Ref Range Status   Specimen Description URINE, CATHETERIZED   Final   Special Requests NONE   Final   Culture  Setup Time     Final   Value: 03/13/2014 23:47     Performed at [place]     Final   Value: NO GROWTH     Performed at [facility]   Culture     Final   Value: NO GROWTH     Performed at [facility]   Report Status 03/14/2014 FINAL   Final  MRSA PCR SCREENING     Status: None   Collection Time    03/14/14  6:02 PM      Result Value Ref Range Status   MRSA by PCR NEGATIVE  NEGATIVE Final   Comment:            The GeneXpert MRSA Assay (FDA     approved for NASAL specimens     only), is one component of a     comprehensive MRSA colonization     surveillance program. It is not     intended to diagnose  MRSA     infection nor to guide or     monitor treatment for     MRSA infections.  CULTURE, BLOOD (ROUTINE X 2)     Status: None   Collection Time    03/14/14  6:59 PM      Result Value Ref Range Status   Specimen Description BLOOD RIGHT ARM POSSIBLE TRANSFUSION REACTION   Final   Special Requests     Final   Value: BOTTLES DRAWN AEROBIC AND ANAEROBIC AEB=6CC ANA=7CC   Culture NO GROWTH 2 DAYS   Final   Report Status PENDING   Incomplete  CULTURE, BLOOD (ROUTINE X 2)     Status: None   Collection Time    03/14/14  7:26 PM      Result Value Ref Range Status   Specimen Description BLOOD LEFT ARM POSSIBLE TRANSFUSION REACTION   Final   Special Requests BOTTLES DRAWN AEROBIC ONLY 4CC   Final   Culture NO GROWTH 2 DAYS   Final   Report Status PENDING   Incomplete    Anti-infectives  Start     Dose/Rate Route Frequency Ordered Stop   03/16/14 1200  levofloxacin (LEVAQUIN) IVPB 750 mg     750 mg 100 mL/hr over 90 Minutes Intravenous  Once 03/16/14 1024     03/14/14 2000  vancomycin (VANCOCIN) IVPB 1000 mg/200 mL premix  Status:  Discontinued     1,000 mg 200 mL/hr over 60 Minutes Intravenous Every 24 hours 03/14/14 1848 03/16/14 1018   03/14/14 2000  piperacillin-tazobactam (ZOSYN) IVPB 3.375 g  Status:  Discontinued     3.375 g 12.5 mL/hr over 240 Minutes Intravenous Every 8 hours 03/14/14 1848 03/16/14 1018   03/13/14 1500  hydroxychloroquine (PLAQUENIL) tablet 200 mg     200 mg Oral 2 times daily 03/13/14 1314       Past Medical History  Diagnosis Date  . Asthma   . Essential hypertension, benign   . Type 2 diabetes mellitus   . SLE (systemic lupus erythematosus)   . Anemia   . Atrial flutter     TEE/DCCV December 2013 - on Xarelto [facility])  . CKD (chronic kidney disease) stage 3, GFR 30-59 ml/min    Medications Prior to Admission  Medication Sig Dispense Refill  . acetaminophen (TYLENOL) 500 MG tablet Take 1,000 mg by mouth every 6 (six) hours as needed. For pain       . albuterol (PROVENTIL HFA;VENTOLIN HFA) 108 (90 BASE) MCG/ACT inhaler Inhale 2 puffs into the lungs every 6 (six) hours as needed. For shortness of breath      . aspirin EC 81 MG tablet Take 81 mg by mouth daily.      . folic acid (FOLVITE) 1 MG tablet Take 1 mg by mouth daily.      . furosemide (LASIX) 20 MG tablet Take 40 mg by mouth 3 (three) times daily as needed for fluid.       . hydroxychloroquine (PLAQUENIL) 200 MG tablet Take 200 mg by mouth 2 (two) times daily.      Marland Kitchen losartan-hydrochlorothiazide (HYZAAR) 100-25 MG per tablet Take 1 tablet by mouth daily.      . meloxicam (MOBIC) 7.5 MG tablet Take 7.5 mg by mouth 2 (two) times daily.      . metFORMIN (GLUCOPHAGE) 500 MG tablet Take by mouth 2 (two) times daily with a meal.      . methotrexate (RHEUMATREX) 2.5 MG tablet Take 10 mg by mouth once a week. Caution:Chemotherapy. Protect from light.      . metoprolol (LOPRESSOR) 50 MG tablet Take 1 tablet (50 mg total) by mouth 2 (two) times daily.  60 tablet  11  . Multiple Vitamins-Minerals (CENTRUM SILVER PO) Take 1 tablet by mouth daily.      . predniSONE (DELTASONE) 2.5 MG tablet Take 2.5 mg by mouth daily with breakfast.      . saxagliptin HCl (ONGLYZA) 2.5 MG TABS tablet Take 2.5 mg by mouth daily.      . simvastatin (ZOCOR) 80 MG tablet Take 80 mg by mouth daily.      . Vitamin D, Ergocalciferol, (DRISDOL) 50000 UNITS CAPS capsule Take 50,000 Units by mouth every 7 (seven) days. Tuesday.       Assessment: 68 yo female with low grade fevers despite antibiotics.   Source unclear- repeating CXR today.  Cx data has been negative.   He has been on Vancomycin & Zosyn.  Renal function worsening.  Orders to change antibiotics to Levaquin.   Levaquin 6/18>> Vancomycin 6/16>>6/18 Zosyn 6/16>>6/18  Goal of  Therapy:  Vancomycin trough level 15-20 mcg/ml  Plan:  Levaquin 750mg  IV q48h Follow-up micro data, labs, vitals.   Biagio Borg 03/16/2014,10:25 AM

## 2014-03-16 NOTE — Progress Notes (Signed)
[facility] PROGRESS NOTE  [name] [id number] DOB: Aug 05, 1946 DOA: 03/13/2014 PCP: [name], MD  Assessment/Plan: Acute respiratory failure with hypoxia  Secondary to acute on chronic diastolic heart failure in setting of worsening anemia. Patient given IV fluids for dehydration and transfusions for anemia on admission. Developed worsening sob with hypoxia. Transferred to ICU on BiPap. Lasix started. Continues to improve this am. sats 98% on 2L.  Echo with grade 4 diastolic dysfunction, BNP elevated, chest xray consistent with CHF. Urine output poor. Appreciate nephrology assistance. Evaluated by cardiology as well who recommend continuation of lasix IV. Weight 94.7kg down from 97.6kg yesterday. Appreciate cardiology assistance. .   Renal failure (ARF), acute on chronic:  Creatinine trending up. Evaluated by nephrology who opine related to prerenal/ATN in setting of ARB, diuretics, NSAID, methotrexate and intravascular volume depletion with poor po intake. Urine output only fair. Renal US yields bilateral normal renal size. Focal dilated calyx in the midpole left kidney without hydronephrosis.Sed rate elevated, await anti-DNA antibody, ANA, ANCA screen C4 complement, IFE urine. Will hold nephrotoxins.   Fever   Max temperature 101.5 last 24 hours. Chest xray without infiltrate. Blood cultures with no growth to date. Urinalysis with many squamous cell and many bacteria, 0-2 WBC. Urine culture no growht. Vancomycin and zosyn empirically day #3.Consider narrowing antibiotics.  Monitor.   Hyponatremia:  Likely related to #2. Fluids per nephrology.  Await 24 hour urine and urine sodium.  Stable. Monitor  Anemia in chronic renal disease and Lupus:  Somewhat symptomatic on presentation. S/p 2 unit PRBC's since admission. HG 8.6. In addition patient takes weekly Procrit injections. Her next injection is due June 17. Work up pending.   Essential hypertension, benign:   Appears controlled. home medications include losartan and HCTZ we will hold these for now. continue her beta blocker and monitor closely. Defer management to cardiology.   COPD (chronic obstructive pulmonary disease):  Appears stable at baseline. Will continue her home medications and nebs   Atrial flutter:  Chart review indicates recent followup with cardiology. Notes indicate patient has been maintaining normal sinus rhythm. EKG as above. Cards following  Morbid obesity:  BMI 34.3. Will obtain nutritional consult  Type 2 diabetes mellitus with diabetic neuropathy:  Uncontrolled. Hemoglobin A1c 6.9. Will hold her oral agents for now. CBG range 143-232. SSI at resistant level. Will add low dose meal coverage.   SLE (systemic lupus erythematosus):  Stable. On treatment with hydroxychloroquine   Code Status: full Family Communication: none present Disposition Plan: home when ready   Consultants:  Cardiology  nephrology  Procedures: echo 03/14/14 Normal LV function EF 50-55% with mild to mod LVH,Grade 4 diastolic dysfunction, mod AI, mild MR, mod-severe TR, PA peak pressure 61 mm Hg.    Antibiotics: Vancomycin 03/14/14>>  Zosyn 03/14/14>>  HPI/Subjective: Awake. Alert reports breathing much better.   Objective: Filed Vitals:   03/16/14 0500  BP: 156/56  Pulse:   Temp:   Resp: 18    Intake/Output Summary (Last 24 hours) at 03/16/14 0822 Last data filed at 03/16/14 0500  Gross per 24 hour  Intake   1650 ml  Output   1000 ml  Net    650 ml   Filed Weights   03/14/14 0513 03/15/14 0500 03/16/14 0500  Weight: 92.761 kg (204 lb 8 oz) 97.6 kg (215 lb 2.7 oz) 94.7 kg (208 lb 12.4 oz)    Exam:   General:  Obese appears comfortable   Cardiovascular: RRR +murmur no  LE edema  Respiratory: normal effort with conversation. BS with with only faint crackle right base otherwise clear. No wheeze  Abdomen: obese soft +BS non-tender to palpation  Musculoskeletal:  Joints  without swelling/erythema  Data Reviewed: Basic Metabolic Panel:  Recent Labs Lab 03/13/14 0851 03/14/14 0622 03/15/14 0551 03/16/14 0728  NA 129* 135* 134* 131*  K 3.7 4.0 3.8 3.8  CL 92* 101 99 95*  CO2 21 22 18* 20  GLUCOSE 323* 198* 222* 225*  BUN 28* 30* 38* 42*  CREATININE 2.41* 2.20* 2.38* 2.71*  CALCIUM 8.3* 7.5* 7.2* 7.4*  PHOS  --   --   --  2.7   Liver Function Tests:  Recent Labs Lab 03/14/14 0622  AST 20  ALT 8  ALKPHOS 67  BILITOT 0.8  PROT 6.5  ALBUMIN 2.3*   No results found for this basename: LIPASE, AMYLASE,  in the last 168 hours No results found for this basename: AMMONIA,  in the last 168 hours CBC:  Recent Labs Lab 03/13/14 0851 03/14/14 0622 03/15/14 0551 03/16/14 0728  WBC 5.7 7.9 9.0 8.6  NEUTROABS 4.1  --  6.5  --   HGB 7.9* 8.2* 8.6* 8.5*  HCT 23.3* 24.4* 25.3* 24.4*  MCV 84.1 83.8 83.5 83.6  PLT 136* 149* 160 184   Cardiac Enzymes:  Recent Labs Lab 03/13/14 0845 03/14/14 0621  TROPONINI <0.30 <0.30   BNP (last 3 results)  Recent Labs  03/15/14 0551  PROBNP 33778.0*   CBG:  Recent Labs Lab 03/15/14 0753 03/15/14 1131 03/15/14 1648 03/15/14 2058 03/16/14 0747  GLUCAP 152* 165* 76 232* 143*    Recent Results (from the past 240 hour(s))  URINE CULTURE     Status: None   Collection Time    03/13/14  9:10 AM      Result Value Ref Range Status   Specimen Description URINE, CATHETERIZED   Final   Special Requests NONE   Final   Culture  Setup Time     Final   Value: 03/13/2014 23:47     Performed at [facility] Count     Final   Value: NO GROWTH     Performed at [facility]   Culture     Final   Value: NO GROWTH     Performed at [facility]   Report Status 03/14/2014 FINAL   Final  MRSA PCR SCREENING     Status: None   Collection Time    03/14/14  6:02 PM      Result Value Ref Range Status   MRSA by PCR NEGATIVE  NEGATIVE Final   Comment:            The GeneXpert  MRSA Assay (FDA     approved for NASAL specimens     only), is one component of a     comprehensive MRSA colonization     surveillance program. It is not     intended to diagnose MRSA     infection nor to guide or     monitor treatment for     MRSA infections.  CULTURE, BLOOD (ROUTINE X 2)     Status: None   Collection Time    03/14/14  6:59 PM      Result Value Ref Range Status   Specimen Description BLOOD RIGHT ARM   Final   Special Requests     Final   Value: BOTTLES DRAWN AEROBIC AND ANAEROBIC AEB  6CC ANA [place]   Culture NO GROWTH 1 DAY   Final   Report Status PENDING   Incomplete  CULTURE, BLOOD (ROUTINE X 2)     Status: None   Collection Time    03/14/14  7:26 PM      Result Value Ref Range Status   Specimen Description BLOOD LEFT ARM   Final   Special Requests BOTTLES DRAWN AEROBIC ONLY 4CC   Final   Culture NO GROWTH 1 DAY   Final   Report Status PENDING   Incomplete     Studies: US Renal  03/15/2014   CLINICAL DATA:  Assess for renal size, hypertension.  EXAM: RENAL/URINARY TRACT ULTRASOUND COMPLETE  COMPARISON:  CT abdomen and pelvis January 11, 2004  FINDINGS: Right Kidney:  Length: 11.9 cm. Echogenicity within normal limits. No hydronephrosis visualized. There is a 3.2 x 2.9 x 3 cm anechoic cyst in the upper pole right kidney.  Left Kidney:  Length: 13.2 cm. Echogenicity within normal limits. No mass visualized. There is dilated calyx in the midpole left kidney. There is no hydronephrosis.  Bladder:  Not visualized due to overlying bowel gas and patient's body habitus per ultrasound technologist.  IMPRESSION: Bilateral normal renal size. Focal dilated calyx in the midpole left kidney without hydronephrosis.   Electronically Signed   By: Abelardo Diesel M.D.   On: 03/15/2014 17:34   Dg Chest Port 1 View  03/15/2014   CLINICAL DATA:  Short of breath.  EXAM: PORTABLE CHEST - 1 VIEW  COMPARISON:  03/14/2014  FINDINGS: Mild enlargement of the cardiopericardial silhouette, stable.  Central vascular congestion and hazy perihilar airspace opacity as well as central interstitial thickening is stable consistent with congestive heart failure and pulmonary edema.  No pneumothorax.  IMPRESSION: No change from the prior study. Findings remain consistent with mild congestive heart failure.   Electronically Signed   By: Lajean Manes M.D.   On: 03/15/2014 10:16   Dg Chest Port 1 View  03/14/2014   CLINICAL DATA:  Shortness of breath.  EXAM: PORTABLE CHEST - 1 VIEW  COMPARISON:  03/13/2014  FINDINGS: Chronic cardiopericardial enlargement. There is vascular pedicle widening. There is newly cephalized blood flow and interstitial indistinctness. No asymmetric opacity, definitive effusion, or pneumothorax.  IMPRESSION: CHF.   Electronically Signed   By: Jorje Guild M.D.   On: 03/14/2014 17:55    Scheduled Meds: . albuterol  3 mL Inhalation Q6H  . antiseptic oral rinse  15 mL Mouth Rinse q12n4p  . aspirin EC  81 mg Oral Daily  . atorvastatin  40 mg Oral q1800  . chlorhexidine  15 mL Mouth Rinse BID  . enoxaparin (LOVENOX) injection  30 mg Subcutaneous Q24H  . epoetin alfa  10,000 Units Subcutaneous Q Wed  . folic acid  1 mg Oral Daily  . furosemide  60 mg Intravenous BID  . hydroxychloroquine  200 mg Oral BID  . insulin aspart  0-20 Units Subcutaneous TID WC  . insulin aspart  0-5 Units Subcutaneous QHS  . [START ON 03/19/2014] methotrexate  10 mg Oral Weekly  . metoprolol  50 mg Oral BID  . piperacillin-tazobactam (ZOSYN)  IV  3.375 g Intravenous Q8H  . predniSONE  2.5 mg Oral Q breakfast  . sodium bicarbonate  650 mg Oral TID  . sodium chloride  3 mL Intravenous Q12H  . vancomycin  1,000 mg Intravenous Q24H  . Vitamin D (Ergocalciferol)  50,000 Units Oral Q7 days   Continuous Infusions:  Principal Problem:   Renal failure (ARF), acute on chronic Active Problems:   Atrial flutter   Morbid obesity   Type 2 diabetes mellitus with diabetic neuropathy   SLE (systemic lupus  erythematosus)   Essential hypertension, benign   COPD (chronic obstructive pulmonary disease)   Anemia in chronic renal disease   Dehydration   Hyponatremia   Acute on chronic renal failure   Dyspnea   Diastolic CHF, acute on chronic   Acute respiratory failure with hypoxia   Fever, unspecified   Acute on chronic diastolic congestive heart failure    Time spent: [facility] Pager [phone]. If 7PM-7AM, please contact night-coverage at [email or web address], password [facility] 03/16/2014, 8:22 AM  LOS: 3 days

## 2014-03-16 NOTE — Progress Notes (Signed)
Patient ID: MERILYNN METTERT, female   DOB: [birth year], 68 y.o.   MRN: [id number]    Subjective:    SOB improving  Objective:   Temp:  [98.8 F (37.1 C)-101.5 F (38.6 C)] 98.8 F (37.1 C) (06/18 0400) Pulse Rate:  [74-76] 76 (06/17 1100) Resp:  [18-35] 18 (06/18 0500) BP: (88-181)/(33-88) 156/56 mmHg (06/18 0500) SpO2:  [98 %-100 %] 98 % (06/18 0717) Weight:  [208 lb 12.4 oz (94.7 kg)] 208 lb 12.4 oz (94.7 kg) (06/18 0500) Last BM Date: 03/15/14  Filed Weights   03/14/14 0513 03/15/14 0500 03/16/14 0500  Weight: 204 lb 8 oz (92.761 kg) 215 lb 2.7 oz (97.6 kg) 208 lb 12.4 oz (94.7 kg)    Intake/Output Summary (Last 24 hours) at 03/16/14 0834 Last data filed at 03/16/14 0500  Gross per 24 hour  Intake   1650 ml  Output   1000 ml  Net    650 ml    Telemetry:NSR  Exam:  General: NAD  Resp: CTAB  Cardiac: RRR, no m/r/g, no JVD  GI: abdomen soft, NT, ND  MSK: no LE edema  Neuro: no focal deficits  Psych: appropriate affect  Lab Results:  Basic Metabolic Panel:  Recent Labs Lab 03/14/14 0622 03/15/14 0551 03/16/14 0728  NA 135* 134* 131*  K 4.0 3.8 3.8  CL 101 99 95*  CO2 22 18* 20  GLUCOSE 198* 222* 225*  BUN 30* 38* 42*  CREATININE 2.20* 2.38* 2.71*  CALCIUM 7.5* 7.2* 7.4*    Liver Function Tests:  Recent Labs Lab 03/14/14 0622  AST 20  ALT 8  ALKPHOS 67  BILITOT 0.8  PROT 6.5  ALBUMIN 2.3*    CBC:  Recent Labs Lab 03/14/14 0622 03/15/14 0551 03/16/14 0728  WBC 7.9 9.0 8.6  HGB 8.2* 8.6* 8.5*  HCT 24.4* 25.3* 24.4*  MCV 83.8 83.5 83.6  PLT 149* 160 184    Cardiac Enzymes:  Recent Labs Lab 03/13/14 0845 03/14/14 0621  TROPONINI <0.30 <0.30    BNP:  Recent Labs  03/15/14 0551  PROBNP 33778.0*    Coagulation: No results found for this basename: INR,  in the last 168 hours  ECG:   Medications:   Scheduled Medications: . albuterol  3 mL Inhalation Q6H  . antiseptic oral rinse  15 mL Mouth Rinse q12n4p    . aspirin EC  81 mg Oral Daily  . atorvastatin  40 mg Oral q1800  . chlorhexidine  15 mL Mouth Rinse BID  . enoxaparin (LOVENOX) injection  30 mg Subcutaneous Q24H  . epoetin alfa  10,000 Units Subcutaneous Q Wed  . folic acid  1 mg Oral Daily  . furosemide  60 mg Intravenous BID  . hydroxychloroquine  200 mg Oral BID  . insulin aspart  0-20 Units Subcutaneous TID WC  . insulin aspart  0-5 Units Subcutaneous QHS  . [START ON 03/19/2014] methotrexate  10 mg Oral Weekly  . metoprolol  50 mg Oral BID  . piperacillin-tazobactam (ZOSYN)  IV  3.375 g Intravenous Q8H  . predniSONE  2.5 mg Oral Q breakfast  . sodium bicarbonate  650 mg Oral TID  . sodium chloride  3 mL Intravenous Q12H  . vancomycin  1,000 mg Intravenous Q24H  . Vitamin D (Ergocalciferol)  50,000 Units Oral Q7 days     Infusions:     PRN Medications:  acetaminophen, acetaminophen, albuterol, alum & mag hydroxide-simeth, bisacodyl, ondansetron (ZOFRAN) IV, ondansetron, zolpidem  Assessment/Plan    1. Acute on chronic diastolic heart failure - echo shows normal LVEF, restrictive diastolic dysfunction - initially presented hypovolemic, in setting of IVF and blood transfusions became fairly acutely volume overloaded requiring transfer to ICU with Bipap and diuresis. - I/Os have her net positive however her symptoms have improved. Her weights are confusing as they show her going from 215 to 208 lbs in one day. Cr and BUN are increasing with diuresis with decreasing Cl in a pattern suggestive of prerenal AKI. CXR shows mild pulm edema, no focal infiltate. She has had fevers with no WBC. Normal renal US. Consider FeUrea to help distinguish possible prerenal AKI. I will hold her diuretics, I don't this she is significantly volume overloaded at this point and several labs suggests she is dry. She does not appear volume overloaded by exam. She is likely very sensitive to rapid volume shifts due to her significant diastolic  dysfunction, perhaps her symptoms developed due to rapid rate of correction of fluid status as opposed to total volume overload. Appreciate renals input.  2. Pulmonary hypertension - likely related to significant left sided diastolic dysfunction. Her PASP may be underestimated by echo due to her significant TR. In the future may consider RHC to further evaluate her pulmonary HTN, her history of lupus does put her at risk for primary arterial pulmonary htn as well, a RHC would help distinguish this, and will be considered at future follow ups.     Carlyle Dolly, M.D., F.A.C.C.

## 2014-03-16 NOTE — Progress Notes (Signed)
Patient seen and examined. Note reviewed.  Patient is feeling better today. Respiratory status is improving. Urine output has been fair. She is on intravenous Lasix. Creatinine continues to rise. Appreciate cardiology and nephrology assistance. She's been started on oral bicarbonate as well as intravenous fluids for renal failure. She's also continued on Lasix. Her hemoglobin appears to be stable. She started on Epogen for anemia of chronic renal disease. She can likely transfer to a telemetry bed. We'll continue current treatments. She still appears to be volume overloaded. Fevers have been improving with antibiotics. We'll repeat chest x-ray to evaluate for developing pneumonia. Can likely de-escalate antibiotics.  MEMON,JEHANZEB

## 2014-03-17 DIAGNOSIS — I279 Pulmonary heart disease, unspecified: Secondary | ICD-10-CM

## 2014-03-17 DIAGNOSIS — I5033 Acute on chronic diastolic (congestive) heart failure: Secondary | ICD-10-CM

## 2014-03-17 LAB — COMPREHENSIVE METABOLIC PANEL
ALBUMIN: 2.2 g/dL — AB (ref 3.5–5.2)
ALK PHOS: 144 U/L — AB (ref 39–117)
ALT: 16 U/L (ref 0–35)
AST: 34 U/L (ref 0–37)
BILIRUBIN TOTAL: 1.1 mg/dL (ref 0.3–1.2)
BUN: 51 mg/dL — ABNORMAL HIGH (ref 6–23)
CHLORIDE: 95 meq/L — AB (ref 96–112)
CO2: 19 meq/L (ref 19–32)
CREATININE: 3.2 mg/dL — AB (ref 0.50–1.10)
Calcium: 7.2 mg/dL — ABNORMAL LOW (ref 8.4–10.5)
GFR calc Af Amer: 16 mL/min — ABNORMAL LOW (ref >90)
GFR, EST NON AFRICAN AMERICAN: 14 mL/min — AB (ref >90)
Glucose, Bld: 134 mg/dL — ABNORMAL HIGH (ref 70–99)
POTASSIUM: 4.1 meq/L (ref 3.7–5.3)
SODIUM: 131 meq/L — AB (ref 137–147)
Total Protein: 7 g/dL (ref 6.0–8.3)

## 2014-03-17 LAB — CBC
HCT: 24.3 % — ABNORMAL LOW (ref 36.0–46.0)
HCT: 23.1 % — ABNORMAL LOW (ref 36.0–46.0)
HEMOGLOBIN: 8 g/dL — AB (ref 12.0–15.0)
HEMOGLOBIN: 7.7 g/dL — AB (ref 12.0–15.0)
MCH: 27.8 pg (ref 26.0–34.0)
MCH: 27.6 pg (ref 26.0–34.0)
MCHC: 32.9 g/dL (ref 30.0–36.0)
MCHC: 33.3 g/dL (ref 30.0–36.0)
MCV: 84.4 fL (ref 78.0–100.0)
MCV: 82.8 fL (ref 78.0–100.0)
PLATELETS: 191 10*3/uL (ref 150–400)
Platelets: 189 10*3/uL (ref 150–400)
RBC: 2.88 MIL/uL — ABNORMAL LOW (ref 3.87–5.11)
RBC: 2.79 MIL/uL — ABNORMAL LOW (ref 3.87–5.11)
RDW: 17.9 % — ABNORMAL HIGH (ref 11.5–15.5)
RDW: 17.7 % — ABNORMAL HIGH (ref 11.5–15.5)
WBC: 7.6 10*3/uL (ref 4.0–10.5)
WBC: 8.3 10*3/uL (ref 4.0–10.5)

## 2014-03-17 LAB — URINALYSIS, ROUTINE W REFLEX MICROSCOPIC
BILIRUBIN URINE: NEGATIVE
GLUCOSE, UA: NEGATIVE mg/dL
GLUCOSE, UA: NEGATIVE mg/dL
KETONES UR: NEGATIVE mg/dL
KETONES UR: NEGATIVE mg/dL
LEUKOCYTES UA: NEGATIVE
Nitrite: NEGATIVE
Nitrite: NEGATIVE
PH: 5.5 (ref 5.0–8.0)
Protein, ur: >300 mg/dL — AB
Protein, ur: >300 mg/dL — AB
Specific Gravity, Urine: >1.03 — ABNORMAL HIGH (ref 1.005–1.030)
Specific Gravity, Urine: 1.023 (ref 1.005–1.030)
Urobilinogen, UA: 1 mg/dL (ref 0.0–1.0)
Urobilinogen, UA: 1 mg/dL (ref 0.0–1.0)
pH: 5 (ref 5.0–8.0)

## 2014-03-17 LAB — GLUCOSE, CAPILLARY
GLUCOSE-CAPILLARY: 132 mg/dL — AB (ref 70–99)
Glucose-Capillary: 123 mg/dL — ABNORMAL HIGH (ref 70–99)
Glucose-Capillary: 77 mg/dL (ref 70–99)
Glucose-Capillary: 103 mg/dL — ABNORMAL HIGH (ref 70–99)

## 2014-03-17 LAB — UIFE/LIGHT CHAINS/TP QN, 24-HR UR
Albumin, U: DETECTED
Alpha 1, Urine: DETECTED — AB
Alpha 2, Urine: DETECTED — AB
Beta, Urine: DETECTED — AB
Free Kappa Lt Chains,Ur: 56.8 mg/dL — ABNORMAL HIGH (ref 0.14–2.42)
Free Kappa/Lambda Ratio: 5.46 ratio (ref 2.04–10.37)
Free Lambda Excretion/Day: 10.92 mg/d
Free Lambda Lt Chains,Ur: 10.4 mg/dL — ABNORMAL HIGH (ref 0.02–0.67)
Free Lt Chn Excr Rate: 59.64 mg/d
Gamma Globulin, Urine: DETECTED — AB
Time: 24 hours
Total Protein, Urine-Ur/day: 166 mg/d — ABNORMAL HIGH (ref 10–140)
Total Protein, Urine: 158 mg/dL
Volume, Urine: 105 mL

## 2014-03-17 LAB — COMPLEMENT, TOTAL: Compl, Total (CH50): 57 U/mL (ref 31–60)

## 2014-03-17 LAB — URINE MICROSCOPIC-ADD ON

## 2014-03-17 LAB — BASIC METABOLIC PANEL
BUN: 48 mg/dL — AB (ref 6–23)
CO2: 21 mEq/L (ref 19–32)
Calcium: 7 mg/dL — ABNORMAL LOW (ref 8.4–10.5)
Chloride: 98 mEq/L (ref 96–112)
Creatinine, Ser: 3.08 mg/dL — ABNORMAL HIGH (ref 0.50–1.10)
GFR, EST AFRICAN AMERICAN: 17 mL/min — AB (ref >90)
GFR, EST NON AFRICAN AMERICAN: 15 mL/min — AB (ref >90)
Glucose, Bld: 121 mg/dL — ABNORMAL HIGH (ref 70–99)
POTASSIUM: 4.4 meq/L (ref 3.7–5.3)
Sodium: 134 mEq/L — ABNORMAL LOW (ref 137–147)

## 2014-03-17 LAB — IMMUNOFIXATION ELECTROPHORESIS
IgA: 290 mg/dL (ref 69–380)
IgG (Immunoglobin G), Serum: 1490 mg/dL (ref 690–1700)
IgM, Serum: 160 mg/dL (ref 52–322)
Total Protein ELP: 6.3 g/dL (ref 6.0–8.3)

## 2014-03-17 LAB — IRON AND TIBC
Iron: 21 ug/dL — ABNORMAL LOW (ref 42–135)
Saturation Ratios: 14 % — ABNORMAL LOW (ref 20–55)
TIBC: 146 ug/dL — ABNORMAL LOW (ref 250–470)
UIBC: 125 ug/dL (ref 125–400)

## 2014-03-17 LAB — FERRITIN: Ferritin: 858 ng/mL — ABNORMAL HIGH (ref 10–291)

## 2014-03-17 LAB — PROTEIN / CREATININE RATIO, URINE
Creatinine, Urine: 145.63 mg/dL
PROTEIN CREATININE RATIO: 2.89 — AB (ref 0.00–0.15)
TOTAL PROTEIN, URINE: 421.2 mg/dL

## 2014-03-17 LAB — HEPATITIS C ANTIBODY: HCV AB: NEGATIVE

## 2014-03-17 LAB — HEPATITIS B SURFACE ANTIGEN: Hepatitis B Surface Ag: NEGATIVE

## 2014-03-17 LAB — PHOSPHORUS: PHOSPHORUS: 2.2 mg/dL — AB (ref 2.3–4.6)

## 2014-03-17 LAB — PTH, INTACT AND CALCIUM
CALCIUM TOTAL (PTH): 7 mg/dL — AB (ref 8.4–10.5)
PTH: 291.8 pg/mL — AB (ref 14.0–72.0)

## 2014-03-17 MED ORDER — ALBUTEROL SULFATE (2.5 MG/3ML) 0.083% IN NEBU
3.0000 mL | INHALATION_SOLUTION | Freq: Three times a day (TID) | RESPIRATORY_TRACT | Status: DC
  Administered 2014-03-17 (×2): 3 mL via RESPIRATORY_TRACT
  Filled 2014-03-17 (×2): qty 3

## 2014-03-17 MED ORDER — FUROSEMIDE 80 MG PO TABS
80.0000 mg | ORAL_TABLET | Freq: Two times a day (BID) | ORAL | Status: DC
  Administered 2014-03-17 – 2014-03-21 (×7): 80 mg via ORAL
  Filled 2014-03-17 (×10): qty 1

## 2014-03-17 MED ORDER — SODIUM CHLORIDE 0.9 % IV SOLN
500.0000 mg | INTRAVENOUS | Status: AC
  Filled 2014-03-17 (×4): qty 4

## 2014-03-17 MED ORDER — SODIUM CHLORIDE 0.9 % IV SOLN
510.0000 mg | INTRAVENOUS | Status: AC
  Administered 2014-03-17 – 2014-03-20 (×2): 510 mg via INTRAVENOUS
  Filled 2014-03-17 (×3): qty 17

## 2014-03-17 MED ORDER — DARBEPOETIN ALFA-POLYSORBATE 200 MCG/0.4ML IJ SOLN
200.0000 ug | INTRAMUSCULAR | Status: DC
  Administered 2014-03-18 – 2014-03-25 (×2): 200 ug via SUBCUTANEOUS
  Filled 2014-03-17 (×2): qty 0.4

## 2014-03-17 MED ORDER — CALCITRIOL 0.25 MCG PO CAPS
0.2500 ug | ORAL_CAPSULE | Freq: Every day | ORAL | Status: DC
  Administered 2014-03-17 – 2014-03-28 (×12): 0.25 ug via ORAL
  Filled 2014-03-17 (×13): qty 1

## 2014-03-17 MED ORDER — FUROSEMIDE 10 MG/ML IJ SOLN
80.0000 mg | Freq: Two times a day (BID) | INTRAMUSCULAR | Status: DC
  Administered 2014-03-17: 80 mg via INTRAVENOUS
  Filled 2014-03-17: qty 8

## 2014-03-17 MED ORDER — ALBUTEROL SULFATE (2.5 MG/3ML) 0.083% IN NEBU
3.0000 mL | INHALATION_SOLUTION | Freq: Two times a day (BID) | RESPIRATORY_TRACT | Status: DC
  Administered 2014-03-18 – 2014-03-28 (×20): 3 mL via RESPIRATORY_TRACT
  Filled 2014-03-17 (×21): qty 3

## 2014-03-17 NOTE — Progress Notes (Signed)
[facility] PROGRESS NOTE  [name] [id number] DOB: 1946-08-16 DOA: 03/13/2014 PCP: [name], MD  Summary:  This is a 68 year old female with history of lupus and chronic kidney disease stage III (baseline creatinine 1.2) and chronic anemia who presents to the emergency room with generalized weakness. She was found to have a hemoglobin of 7.3 and creatinine of 2. It was felt that her weakness may be related to her anemia she was transfused 1 unit of PRBCs and started on IV fluids. The patient subsequently developed volume overload, shortness of breath and required transfer to the step down unit for BiPAP therapy. She was seen by cardiology and started on intravenous Lasix. Her respiratory status subsequently improved and she's been weaned back down to 2 L of oxygen. She is no longer short of breath. Unfortunately, her creatinine has continued to rise. Currently it is 3 and her urine output is now unimpressive. She's been having intermittent fevers as high as 103. Renal workup showed positive ANA, double-stranded DNA as well as positive p-ANCA. It was felt the patient would likely need a renal biopsy to further identify the cause of her renal failure. If she does in fact have an anchor nephropathy, she would need further treatments including steroids, cyclophosphamide and plasmapheresis. Recommendations were to transfer the patient to [facility] for further workup. Regarding her fevers, she was started on empiric antibiotics but does not have any clear source of infection. She does not appear septic and her fevers may relate to her underlying connective tissue disease/vasculitis.  Assessment/Plan: Acute respiratory failure with hypoxia  Secondary to acute on chronic diastolic heart failure in setting of worsening anemia. Patient given IV fluids for renal failure and transfusions for anemia on admission. Developed worsening sob with hypoxia. Transferred to ICU on BiPap. She  was started on Lasix and initially had diuresis with improvement in her respiratory status.  Echo with grade 4 diastolic dysfunction, BNP elevated, chest xray consistent with CHF. Cardiology was following the patient and felt that her respiratory failure may have been due to rapid changes in volume status. She has now been weaned down to 2 L of oxygen.   Renal failure (ARF), acute on chronic:  Renal failure continues to deteriorate. Creatinine is up to 3 and urine output has been unimpressive. She is being followed by nephrology and is currently on IV fluids as well as intravenous Lasix. Renal ultrasound did not show any hydronephrosis. Patient had a battery of tests sent which indicated a positive ANA, inconclusive double-stranded DNA, positive p-ANCA. This was discussed with nephrology and recommendations were for transferred to [facility]. It was felt that she would likely need a renal biopsy to further confirm a diagnosis. If she does have an ANCA vasculitis, she would need further treatments including steroids, plasmapheresis and possibly cyclophosphamide. I have discussed the case with Dr. Lorrene [name] at [facility], who will see the patient in consultation.  Fever   Patient continues to have fever spikes. She does not appear septic or toxic and does not have any clear source of infection. She was empirically started on antibiotics, blood cultures have shown no growth. She was T. escalated to Levaquin. It is possible that her fevers may be related to her underlying connective tissue disorder/vasculitis.   Hyponatremia:  Likely related to #2. Fluids per nephrology.  Improving   Anemia in chronic renal disease and Lupus:  Somewhat symptomatic on presentation with hemoglobin of 7.3. S/p 1 unit PRBC's since admission. HG  8.0. In addition patient takes weekly Procrit injections. No obvious source of bleeding.  Essential hypertension, benign:  Appears controlled. home medications include losartan  and HCTZ which are on hold for now. continue her beta blocker and monitor closely. Defer management to cardiology.   COPD (chronic obstructive pulmonary disease):  Appears stable at baseline. Will continue her home medications and nebs   Atrial flutter:  Chart review indicates recent followup with cardiology. Notes indicate patient has been maintaining normal sinus rhythm. EKG as above. Cards following  Morbid obesity:  BMI 34.3. Will obtain nutritional consult  Type 2 diabetes mellitus with diabetic neuropathy:  Uncontrolled. Hemoglobin A1c 6.9. Will hold her oral agents for now. Reasonable control with sliding scale insulin and low-dose meal coverage.   SLE (systemic lupus erythematosus):  Stable. On treatment with hydroxychloroquine and chronic prednisone  Code Status: full Family Communication: Discussed with husband at the bedside Disposition Plan: Transfer to [facility] for further treatments. Dr. Tana Coast has accepted the patient in transfer   Consultants:  Cardiology  nephrology  Procedures: echo 03/14/14 Normal LV function EF 50-55% with mild to mod LVH,Grade 4 diastolic dysfunction, mod AI, mild MR, mod-severe TR, PA peak pressure 61 mm Hg.    Antibiotics: Vancomycin 03/14/14>> 6/18 Zosyn 03/14/14>> 6/18 Levaquin 6/18>>  HPI/Subjective: Feeling better. Feels shortness of breath is improving. No new complaints  Objective: Filed Vitals:   03/17/14 1403  BP: 147/62  Pulse: 87  Temp: 99.3 F (37.4 C)  Resp: 20    Intake/Output Summary (Last 24 hours) at 03/17/14 1424 Last data filed at 03/17/14 1316  Gross per 24 hour  Intake   1160 ml  Output    800 ml  Net    360 ml   Filed Weights   03/15/14 0500 03/16/14 0500 03/17/14 0432  Weight: 97.6 kg (215 lb 2.7 oz) 94.7 kg (208 lb 12.4 oz) 95.936 kg (211 lb 8 oz)    Exam:   General:  Obese appears comfortable   Cardiovascular: RRR +murmur no LE edema  Respiratory: normal effort with conversation. BS with  with only faint crackle right base otherwise clear. No wheeze  Abdomen: obese soft +BS non-tender to palpation  Musculoskeletal:  Joints without swelling/erythema  Data Reviewed: Basic Metabolic Panel:  Recent Labs Lab 03/13/14 0851 03/14/14 0622 03/15/14 0551 03/16/14 0728 03/16/14 0835 03/17/14 0541  NA 129* 135* 134* 131*  --  134*  K 3.7 4.0 3.8 3.8  --  4.4  CL 92* 101 99 95*  --  98  CO2 21 22 18* 20  --  21  GLUCOSE 323* 198* 222* 225*  --  121*  BUN 28* 30* 38* 42*  --  48*  CREATININE 2.41* 2.20* 2.38* 2.71*  --  3.08*  CALCIUM 8.3* 7.5* 7.2* 7.4* PENDING 7.0*  PHOS  --   --   --  2.7  --  2.2*   Liver Function Tests:  Recent Labs Lab 03/14/14 0622  AST 20  ALT 8  ALKPHOS 67  BILITOT 0.8  PROT 6.5  ALBUMIN 2.3*   No results found for this basename: LIPASE, AMYLASE,  in the last 168 hours No results found for this basename: AMMONIA,  in the last 168 hours CBC:  Recent Labs Lab 03/13/14 0851 03/14/14 0622 03/15/14 0551 03/16/14 0728 03/17/14 0541  WBC 5.7 7.9 9.0 8.6 7.6  NEUTROABS 4.1  --  6.5  --   --   HGB 7.9* 8.2* 8.6* 8.5* 8.0*  HCT 23.3* 24.4* 25.3* 24.4* 24.3*  MCV 84.1 83.8 83.5 83.6 84.4  PLT 136* 149* 160 184 189   Cardiac Enzymes:  Recent Labs Lab 03/13/14 0845 03/14/14 0621  TROPONINI <0.30 <0.30   BNP (last 3 results)  Recent Labs  03/15/14 0551  PROBNP 33778.0*   CBG:  Recent Labs Lab 03/16/14 1222 03/16/14 1647 03/16/14 2043 03/17/14 0740 03/17/14 1148  GLUCAP 174* 167* 113* 123* 132*    Recent Results (from the past 240 hour(s))  URINE CULTURE     Status: None   Collection Time    03/13/14  9:10 AM      Result Value Ref Range Status   Specimen Description URINE, CATHETERIZED   Final   Special Requests NONE   Final   Culture  Setup Time     Final   Value: 03/13/2014 23:47     Performed at [place]     Final   Value: NO GROWTH     Performed at [facility]    Culture     Final   Value: NO GROWTH     Performed at [facility]   Report Status 03/14/2014 FINAL   Final  MRSA PCR SCREENING     Status: None   Collection Time    03/14/14  6:02 PM      Result Value Ref Range Status   MRSA by PCR NEGATIVE  NEGATIVE Final   Comment:            The GeneXpert MRSA Assay (FDA     approved for NASAL specimens     only), is one component of a     comprehensive MRSA colonization     surveillance program. It is not     intended to diagnose MRSA     infection nor to guide or     monitor treatment for     MRSA infections.  URINE CULTURE     Status: None   Collection Time    03/14/14  6:34 PM      Result Value Ref Range Status   Specimen Description URINE, CATHETERIZED   Final   Special Requests IMMUNE:COMPROMISED   Final   Culture  Setup Time     Final   Value: 03/15/2014 14:46     Performed at [facility]     Final   Value: NO GROWTH     Performed at [facility]   Culture     Final   Value: NO GROWTH     Performed at [facility]   Report Status 03/16/2014 FINAL   Final  CULTURE, BLOOD (ROUTINE X 2)     Status: None   Collection Time    03/14/14  6:59 PM      Result Value Ref Range Status   Specimen Description BLOOD RIGHT ARM POSSIBLE TRANSFUSION REACTION   Final   Special Requests     Final   Value: BOTTLES DRAWN AEROBIC AND ANAEROBIC AEB=6CC ANA=7CC   Culture NO GROWTH 2 DAYS   Final   Report Status PENDING   Incomplete  CULTURE, BLOOD (ROUTINE X 2)     Status: None   Collection Time    03/14/14  7:26 PM      Result Value Ref Range Status   Specimen Description BLOOD LEFT ARM POSSIBLE TRANSFUSION REACTION   Final   Special Requests BOTTLES DRAWN AEROBIC ONLY 4CC  Final   Culture NO GROWTH 2 DAYS   Final   Report Status PENDING   Incomplete     Studies: Dg Chest 2 View  03/16/2014   CLINICAL DATA:  Fever.  EXAM: CHEST  2 VIEW  COMPARISON:  March 15, 2014.  FINDINGS: Stable  cardiomegaly. No pneumothorax or pleural effusion is noted. No acute pulmonary disease is noted. Bony thorax appears intact  IMPRESSION: No acute cardiopulmonary abnormality seen.   Electronically Signed   By: Sabino Dick M.D.   On: 03/16/2014 12:16   US Renal  03/15/2014   CLINICAL DATA:  Assess for renal size, hypertension.  EXAM: RENAL/URINARY TRACT ULTRASOUND COMPLETE  COMPARISON:  CT abdomen and pelvis January 11, 2004  FINDINGS: Right Kidney:  Length: 11.9 cm. Echogenicity within normal limits. No hydronephrosis visualized. There is a 3.2 x 2.9 x 3 cm anechoic cyst in the upper pole right kidney.  Left Kidney:  Length: 13.2 cm. Echogenicity within normal limits. No mass visualized. There is dilated calyx in the midpole left kidney. There is no hydronephrosis.  Bladder:  Not visualized due to overlying bowel gas and patient's body habitus per ultrasound technologist.  IMPRESSION: Bilateral normal renal size. Focal dilated calyx in the midpole left kidney without hydronephrosis.   Electronically Signed   By: Abelardo Diesel M.D.   On: 03/15/2014 17:34    Scheduled Meds: . [START ON 03/18/2014] albuterol  3 mL Inhalation BID  . antiseptic oral rinse  15 mL Mouth Rinse q12n4p  . aspirin EC  81 mg Oral Daily  . atorvastatin  40 mg Oral q1800  . chlorhexidine  15 mL Mouth Rinse BID  . enoxaparin (LOVENOX) injection  30 mg Subcutaneous Q24H  . epoetin alfa  10,000 Units Subcutaneous Q Wed  . folic acid  1 mg Oral Daily  . furosemide  80 mg Intravenous BID  . hydroxychloroquine  200 mg Oral BID  . insulin aspart  0-20 Units Subcutaneous TID WC  . insulin aspart  0-5 Units Subcutaneous QHS  . insulin aspart  2 Units Subcutaneous TID WC  . [START ON 03/18/2014] levofloxacin (LEVAQUIN) IV  750 mg Intravenous Q48H  . metoprolol  50 mg Oral BID  . predniSONE  2.5 mg Oral Q breakfast  . sodium bicarbonate  650 mg Oral TID  . sodium chloride  3 mL Intravenous Q12H  . Vitamin D (Ergocalciferol)  50,000  Units Oral Q7 days   Continuous Infusions: . sodium chloride 135 mL/hr at 03/17/14 1045    Principal Problem:   Renal failure (ARF), acute on chronic Active Problems:   Atrial flutter   Morbid obesity   Type 2 diabetes mellitus with diabetic neuropathy   SLE (systemic lupus erythematosus)   Essential hypertension, benign   COPD (chronic obstructive pulmonary disease)   Anemia in chronic renal disease   Dehydration   Hyponatremia   Acute on chronic renal failure   Dyspnea   Diastolic CHF, acute on chronic   Acute respiratory failure with hypoxia   Fever, unspecified   Acute on chronic diastolic congestive heart failure    Time spent: 45 minutes >50% of time spent coordinating care and arranging transfer to [place]    [facility] Pager [phone]. If 7PM-7AM, please contact night-coverage at [email or web address], password [facility] 03/17/2014, 2:24 PM  LOS: 4 days

## 2014-03-17 NOTE — Progress Notes (Signed)
Subjective: Interval History: She has occasional cough but no sputum production. No nausea or vomiting. Her breathing is better  Objective: Vital signs in last 24 hours: Temp:  [98.9 F (37.2 C)-102.4 F (39.1 C)] 99.6 F (37.6 C) (06/19 0432) Pulse Rate:  [73-98] 97 (06/18 2009) Resp:  [20-24] 20 (06/19 0432) BP: (129-149)/(52-79) 149/53 mmHg (06/19 0432) SpO2:  [94 %-100 %] 97 % (06/19 0706) Weight:  [95.936 kg (211 lb 8 oz)] 95.936 kg (211 lb 8 oz) (06/19 0432) Weight change: 1.236 kg (2 lb 11.6 oz)  Intake/Output from previous day: 06/18 0701 - 06/19 0700 In: 970 [P.O.:680; I.V.:140; IV Piggyback:150] Out: 400 [Urine:400] Intake/Output this shift: Total I/O In: -  Out: 100 [Urine:100]  General appearance: alert, cooperative and no distress Resp: clear to auscultation bilaterally Cardio: regular rate and rhythm, S1, S2 normal, no murmur, click, rub or gallop GI: soft, non-tender; bowel sounds normal; no masses,  no organomegaly Extremities: extremities normal, atraumatic, no cyanosis or edema  Lab Results:  Recent Labs  03/16/14 0728 03/17/14 0541  WBC 8.6 7.6  HGB 8.5* 8.0*  HCT 24.4* 24.3*  PLT 184 189   BMET:   Recent Labs  03/16/14 0728 03/17/14 0541  NA 131* 134*  K 3.8 4.4  CL 95* 98  CO2 20 21  GLUCOSE 225* 121*  BUN 42* 48*  CREATININE 2.71* 3.08*  CALCIUM 7.4* 7.0*   No results found for this basename: PTH,  in the last 72 hours Iron Studies: No results found for this basename: IRON, TIBC, TRANSFERRIN, FERRITIN,  in the last 72 hours  Studies/Results: Dg Chest 2 View  03/16/2014   CLINICAL DATA:  Fever.  EXAM: CHEST  2 VIEW  COMPARISON:  March 15, 2014.  FINDINGS: Stable cardiomegaly. No pneumothorax or pleural effusion is noted. No acute pulmonary disease is noted. Bony thorax appears intact  IMPRESSION: No acute cardiopulmonary abnormality seen.   Electronically Signed   By: Sabino Dick M.D.   On: 03/16/2014 12:16   US Renal  03/15/2014    CLINICAL DATA:  Assess for renal size, hypertension.  EXAM: RENAL/URINARY TRACT ULTRASOUND COMPLETE  COMPARISON:  CT abdomen and pelvis January 11, 2004  FINDINGS: Right Kidney:  Length: 11.9 cm. Echogenicity within normal limits. No hydronephrosis visualized. There is a 3.2 x 2.9 x 3 cm anechoic cyst in the upper pole right kidney.  Left Kidney:  Length: 13.2 cm. Echogenicity within normal limits. No mass visualized. There is dilated calyx in the midpole left kidney. There is no hydronephrosis.  Bladder:  Not visualized due to overlying bowel gas and patient's body habitus per ultrasound technologist.  IMPRESSION: Bilateral normal renal size. Focal dilated calyx in the midpole left kidney without hydronephrosis.   Electronically Signed   By: Abelardo Diesel M.D.   On: 03/15/2014 17:34   Dg Chest Port 1 View  03/15/2014   CLINICAL DATA:  Short of breath.  EXAM: PORTABLE CHEST - 1 VIEW  COMPARISON:  03/14/2014  FINDINGS: Mild enlargement of the cardiopericardial silhouette, stable. Central vascular congestion and hazy perihilar airspace opacity as well as central interstitial thickening is stable consistent with congestive heart failure and pulmonary edema.  No pneumothorax.  IMPRESSION: No change from the prior study. Findings remain consistent with mild congestive heart failure.   Electronically Signed   By: Lajean Manes M.D.   On: 03/15/2014 10:16    I have reviewed the patient's current medications.  Assessment/Plan: Problem #1 acute kidney injury: This is  thought to be multifactorial including prerenal syndrome/ARBS/non-steroidal/methotrexate. Her renal function continue to decline. Patient is presently noe oliguric and asymptoamtic Problem #2 history of diabetes> her blood sugar is reanably controled on the hospital Problem #3 hypertension her blood pressure is with in acceptable range Problem #4 history of chronic renal failure stage III: Multifactorial including diabetes/hypertension/ lupus  nephritis.  Problem #5 history of lupus. ANA is positive and her C3 is low . Even though her Anti double stranded DNA is intermedietly high [ inconclusive ] Patient still may have lupus Problem #6 anemia : her iron studies are pending Problem #7 COPD  Problem #8 atrial flutter. Problem#9Protienuia : None nephrotic range . Polyclonal and Kappa to Lambda ratio is normal Problem #10 Patient with positive for Atypical  P- ANCA  Possibly associated with lupus as patient does not have other findings consistent with vasculitis Plan: Will start patient on normal saline a 135 cc per hour We'll continue with Lasix 80 mg iv bid Patient will require kidney biopsy to determine the etiology of her renal failure We'll check basic metabolic panel/ Hep c antibody and hep B surface antigen.   LOS: 4 days   [name] S 03/17/2014,10:00 AM

## 2014-03-17 NOTE — Progress Notes (Signed)
[facility] here to transport patient to [facility]- pt in stable condition

## 2014-03-17 NOTE — Progress Notes (Signed)
Patient ID: [name], female   DOB: 1946/06/14, 68 y.o.   MRN: [id number]     Subjective:   No events overnight   Objective:   Temp:  [98.9 F (37.2 C)-102.4 F (39.1 C)] 99.6 F (37.6 C) (06/19 0432) Pulse Rate:  [73-98] 97 (06/18 2009) Resp:  [20-24] 20 (06/19 0432) BP: (129-149)/(52-79) 149/53 mmHg (06/19 0432) SpO2:  [94 %-100 %] 97 % (06/19 0706) Weight:  [211 lb 8 oz (95.936 kg)] 211 lb 8 oz (95.936 kg) (06/19 0432) Last BM Date: 03/16/14  Filed Weights   03/15/14 0500 03/16/14 0500 03/17/14 0432  Weight: 215 lb 2.7 oz (97.6 kg) 208 lb 12.4 oz (94.7 kg) 211 lb 8 oz (95.936 kg)    Intake/Output Summary (Last 24 hours) at 03/17/14 1033 Last data filed at 03/17/14 0927  Gross per 24 hour  Intake    970 ml  Output    500 ml  Net    470 ml    Exam:  General: NAD  Resp: CTAB  Cardiac: RRR, no m/r/g, no JVD  GI: abdomen soft, NT, ND  MSK: no LE edema  Neuro: no focal deficits  Psych: appropriate affect   Lab Results:  Basic Metabolic Panel:  Recent Labs Lab 03/15/14 0551 03/16/14 0728 03/17/14 0541  NA 134* 131* 134*  K 3.8 3.8 4.4  CL 99 95* 98  CO2 18* 20 21  GLUCOSE 222* 225* 121*  BUN 38* 42* 48*  CREATININE 2.38* 2.71* 3.08*  CALCIUM 7.2* 7.4* 7.0*    Liver Function Tests:  Recent Labs Lab 03/14/14 0622  AST 20  ALT 8  ALKPHOS 67  BILITOT 0.8  PROT 6.5  ALBUMIN 2.3*    CBC:  Recent Labs Lab 03/15/14 0551 03/16/14 0728 03/17/14 0541  WBC 9.0 8.6 7.6  HGB 8.6* 8.5* 8.0*  HCT 25.3* 24.4* 24.3*  MCV 83.5 83.6 84.4  PLT 160 184 189    Cardiac Enzymes:  Recent Labs Lab 03/13/14 0845 03/14/14 0621  TROPONINI <0.30 <0.30    BNP:  Recent Labs  03/15/14 0551  PROBNP 33778.0*    Coagulation: No results found for this basename: INR,  in the last 168 hours  ECG:   Medications:   Scheduled Medications: . albuterol  3 mL Inhalation TID  . antiseptic oral rinse  15 mL Mouth Rinse q12n4p  . aspirin EC  81  mg Oral Daily  . atorvastatin  40 mg Oral q1800  . chlorhexidine  15 mL Mouth Rinse BID  . enoxaparin (LOVENOX) injection  30 mg Subcutaneous Q24H  . epoetin alfa  10,000 Units Subcutaneous Q Wed  . folic acid  1 mg Oral Daily  . hydroxychloroquine  200 mg Oral BID  . insulin aspart  0-20 Units Subcutaneous TID WC  . insulin aspart  0-5 Units Subcutaneous QHS  . insulin aspart  2 Units Subcutaneous TID WC  . [START ON 03/18/2014] levofloxacin (LEVAQUIN) IV  750 mg Intravenous Q48H  . metoprolol  50 mg Oral BID  . predniSONE  2.5 mg Oral Q breakfast  . sodium bicarbonate  650 mg Oral TID  . sodium chloride  3 mL Intravenous Q12H  . Vitamin D (Ergocalciferol)  50,000 Units Oral Q7 days     Infusions: . sodium chloride 75 mL/hr at 03/17/14 0145     PRN Medications:  acetaminophen, acetaminophen, albuterol, alum & mag hydroxide-simeth, bisacodyl, ondansetron (ZOFRAN) IV, ondansetron, zolpidem     Assessment/Plan   1. Acute  on chronic diastolic heart failure  - echo shows normal LVEF, restrictive diastolic dysfunction  - initially presented hypovolemic, in setting of IVF and blood transfusions became fairly acutely volume overloaded requiring transfer to ICU with Bipap and diuresis.  - I/Os have her net positive however her symptoms have improved. Her weights are confusing as they show her going from 215 to 208 lbs in one day. Cr and BUN increased with diuresis with decreasing Cl in a pattern suggestive of prerenal AKI. CXR initially showed mild pulm edema that on repeat CXR 6/18 has resolved. She has had high fevers with no WBC. Normal renal US. She has proteinuria and history of lupus, work per nephrology - Consider FeUrea to help distinguish possible prerenal AKI. Continue hold her diuretics, I don't this she is significantly volume overloaded at this point and several labs suggests she is dry. She does not appear volume overloaded by exam and repeat CXR 6/18 was clear. - agree with  trial of gentle hydration  2. Pulmonary hypertension  - likely related to significant left sided diastolic dysfunction. Her PASP may be underestimated by echo due to her significant TR. Fairly stable, PASP from echo 08/2012 reported at 54 mmHg.  In the future may consider RHC to further evaluate her pulmonary HTN, her history of lupus does put her at risk for primary arterial pulmonary htn as well, a RHC would help distinguish this, and will be considered at future follow ups.   3. Fever - workup and management per primary team       Carlyle Dolly, M.D., F.A.C.C.

## 2014-03-17 NOTE — Progress Notes (Signed)
Report given to [facility].  Pt aware of transfer plan, family at bedside.  Bed available at [facility].

## 2014-03-17 NOTE — Consult Note (Addendum)
Requesting Physician:  Dr.Memon Primary Care MD: Dr. Hilma Favors Rheumatologist: Dr. Amil Amen  Reason for Consult:  Acute kidney injury  HPI: The patient is a 68 y.o. year-old AAF with a background of DM, HTN, COPD, atrial flutter, diastolic dysfunction, mild AS, anemia on Procrit and SLE.  She cannot recall how long she has had lupus, but states is followed every 3 months or so by Dr. Amil Amen and takes prednisone, plaquenil and methotrexate.  She also has anemia for which she receives procrit injections (rec's 40,000 units once/week and just started this on 6/10) She presented to [facility] on 6/15 "feeling bad", with no energy and DOE. Her admit labs were notable for a creatinine of 2.41 (basel8ine 1.2-1.4 most recently), Hb of 7.9 and mild congestion on CXR.  She received IVF as well as PRBC's, developed acute on chronic diastolic CHF d/t volume overload, and required BIPAP/diuretics with improvement.    Because of her AKI Nephrology at [facility] was consulted and the following data were obtained: Renal ultrasound: 11.9 and 13.2 cm kidneys bilaterally with a right upper pole cyst and a dilated calyx on the left but no hydronephrosis (6/17) UA showed 100 protein X2, >300 protein on 6/19 with 7-10  WBC, 3-6 RBC ANA + 1:640  dsDNA 7 (indeterminate) C3 low at 82  C4 normal at 28 ATYPICAL pANCA + at 1:640 with negative C-ANCA screen and pANCA screen ESR 119 Urine protein to creatinine ratio calculated from spot urine for IFE showed about 2.2 grams proteinuria Because of the continued rise in creatinine and concern that she would need renal biopsy to guide treatment she was transferred here for further evaluation. She states no prior knowledge of blood or protein in her urine and says was never aware of any kidney issues until now  Other relevant labs included Hb of 8 today (rec'd 1 unit of PRBC's) Fe 21 with a low tsat of 14 Intact PTH elevated at 291 Polyclonal SPEP 01/2014  Pt's creatinine trending is as  follows: Creatinine, Ser  Date/Time Value Ref Range Status  03/17/2014  5:41 AM 3.08* 0.50 - 1.10 mg/dL Final  03/16/2014  7:28 AM 2.71* 0.50 - 1.10 mg/dL Final  03/15/2014  5:51 AM 2.38* 0.50 - 1.10 mg/dL Final  03/14/2014  6:22 AM 2.20* 0.50 - 1.10 mg/dL Final  03/13/2014  8:51 AM 2.41* 0.50 - 1.10 mg/dL Final  02/16/2014  2:00 PM 1.20* 0.50 - 1.10 mg/dL Final  09/08/2013  1:51 PM 1.40* 0.50 - 1.10 mg/dL Final  09/20/2012  4:14 PM 1.02  0.50 - 1.10 mg/dL Final  09/19/2012  6:20 AM 1.18* 0.50 - 1.10 mg/dL Final  09/18/2012  4:30 AM 1.31* 0.50 - 1.10 mg/dL Final  09/17/2012  4:25 AM 1.56* 0.50 - 1.10 mg/dL Final  09/16/2012 12:21 PM 1.66* 0.50 - 1.10 mg/dL Final  09/15/2012  9:01 PM 1.49* 0.50 - 1.10 mg/dL Final  09/15/2012  5:08 AM 1.54* 0.50 - 1.10 mg/dL Final  09/14/2012 11:14 AM 1.26* 0.50 - 1.10 mg/dL Final  09/04/2012 10:15 AM 1.08  0.50 - 1.10 mg/dL Final  09/27/2007  7:10 PM 0.89   Final   For reasons not entirely clear to me she was started on IVF at 135/hour AND lasix 80 IV BID today before her transfer She was on losartan PTA but this was not continued in the hospital She has not been getting her plaquenil or methotrexate in the hospital She did get 4 doses of meloxicam at [facility]   Past Medical  History  Diagnosis Date  . Asthma   . Essential hypertension, benign   . Type 2 diabetes mellitus   . SLE (systemic lupus erythematosus)   . Anemia   . Atrial flutter     TEE/DCCV December 2013 - on Xarelto [facility])  . CKD (chronic kidney disease) stage 3, GFR 30-59 ml/min     Past Surgical History  Procedure Laterality Date  . Ankle surgery  1993  . Back surgery  1980  . Abdominal hysterectomy  1983  . Tee without cardioversion  09/16/2012    Procedure: TRANSESOPHAGEAL ECHOCARDIOGRAM (TEE);  Surgeon: Sanda Klein, MD;  Location: [place];  Service: Cardiovascular;  Laterality: N/A;  . Cardioversion  09/16/2012    Procedure: CARDIOVERSION;  Surgeon: Sanda Klein, MD;   Location: [facility] ENDOSCOPY;  Service: Cardiovascular;  Laterality: N/A;  . Esophagogastroduodenoscopy  09/20/2012    Procedure: ESOPHAGOGASTRODUODENOSCOPY (EGD);  Surgeon: Cleotis Nipper, MD;  Location: [facility] ENDOSCOPY;  Service: Endoscopy;  Laterality: Left;  . Colonoscopy  09/20/2012    Procedure: COLONOSCOPY;  Surgeon: Cleotis Nipper, MD;  Location: [facility] ENDOSCOPY;  Service: Endoscopy;  Laterality: Left;    Family History  Problem Relation Age of Onset  . Diabetes Sister   . Diabetes Brother   . Diabetes Father   . Hypertension Father   . Hypertension Sister   . Hypertension Brother   . Stroke Mother    Social History:  reports that she has quit smoking. Her smoking use included Cigarettes. She has a 30 pack-year smoking history. She does not have any smokeless tobacco history on file. She reports that she does not drink alcohol or use illicit drugs. She previously worked as a Secretary/administrator  Allergies: No Known Allergies  Home medications: Prior to Admission medications   Medication Sig Start Date End Date Taking? Authorizing Provider  acetaminophen (TYLENOL) 500 MG tablet Take 1,000 mg by mouth every 6 (six) hours as needed. For pain   Yes Historical Provider, MD  albuterol (PROVENTIL HFA;VENTOLIN HFA) 108 (90 BASE) MCG/ACT inhaler Inhale 2 puffs into the lungs every 6 (six) hours as needed. For shortness of breath   Yes Historical Provider, MD  aspirin EC 81 MG tablet Take 81 mg by mouth daily.   Yes Historical Provider, MD  folic acid (FOLVITE) 1 MG tablet Take 1 mg by mouth daily.   Yes Historical Provider, MD  furosemide (LASIX) 20 MG tablet Take 40 mg by mouth 3 (three) times daily as needed for fluid.  09/20/12  Yes Luke K Kilroy, PA-C  hydroxychloroquine (PLAQUENIL) 200 MG tablet Take 200 mg by mouth 2 (two) times daily.   Yes Historical Provider, MD  losartan-hydrochlorothiazide (HYZAAR) 100-25 MG per tablet Take 1 tablet by mouth daily.   Yes Historical Provider, MD  meloxicam  (MOBIC) 7.5 MG tablet Take 7.5 mg by mouth 2 (two) times daily.   Yes Historical Provider, MD  metFORMIN (GLUCOPHAGE) 500 MG tablet Take by mouth 2 (two) times daily with a meal.   Yes Historical Provider, MD  methotrexate (RHEUMATREX) 2.5 MG tablet Take 10 mg by mouth once a week. Caution:Chemotherapy. Protect from light.   Yes Historical Provider, MD  metoprolol (LOPRESSOR) 50 MG tablet Take 1 tablet (50 mg total) by mouth 2 (two) times daily. 04/18/13  Yes Mihai Croitoru, MD  Multiple Vitamins-Minerals (CENTRUM SILVER PO) Take 1 tablet by mouth daily.   Yes Historical Provider, MD  predniSONE (DELTASONE) 2.5 MG tablet Take 2.5 mg by mouth daily with breakfast.  Yes Historical Provider, MD  saxagliptin HCl (ONGLYZA) 2.5 MG TABS tablet Take 2.5 mg by mouth daily.   Yes Historical Provider, MD  simvastatin (ZOCOR) 80 MG tablet Take 80 mg by mouth daily.   Yes Historical Provider, MD  Vitamin D, Ergocalciferol, (DRISDOL) 50000 UNITS CAPS capsule Take 50,000 Units by mouth every 7 (seven) days. Tuesday.   Yes Historical Provider, MD    Inpatient medications: . [START ON 03/18/2014] albuterol  3 mL Inhalation BID  . antiseptic oral rinse  15 mL Mouth Rinse q12n4p  . aspirin EC  81 mg Oral Daily  . atorvastatin  40 mg Oral q1800  . chlorhexidine  15 mL Mouth Rinse BID  . enoxaparin (LOVENOX) injection  30 mg Subcutaneous Q24H  . epoetin alfa  10,000 Units Subcutaneous Q Wed  . ferumoxytol  510 mg Intravenous Q3 days  . folic acid  1 mg Oral Daily  . furosemide  80 mg Intravenous BID  . hydroxychloroquine  200 mg Oral BID  . insulin aspart  0-20 Units Subcutaneous TID WC  . insulin aspart  0-5 Units Subcutaneous QHS  . insulin aspart  2 Units Subcutaneous TID WC  . [START ON 03/18/2014] levofloxacin (LEVAQUIN) IV  750 mg Intravenous Q48H  . metoprolol  50 mg Oral BID  . predniSONE  2.5 mg Oral Q breakfast  . sodium bicarbonate  650 mg Oral TID  . sodium chloride  3 mL Intravenous Q12H  .  Vitamin D (Ergocalciferol)  50,000 Units Oral Q7 days    Review of Systems Gen:  Denies headache, fever, chills, sweats.  No weight loss. HEENT:  No visual change, sore throat, difficulty swallowing. Resp:  She was having weakness and shortness of breath PTA at [facility], worsened with fluids, improved after diuresis and is "OK" right now. Has chronic DOE Cardiac:  No chest pain, orthopnea, PND.  Denies edema. GI:   Denies abdominal pain.   No nausea, vomiting, diarrhea.  No constipation. Has had some appetite reduction GU:  Denies difficulty or change in voiding.  No change in urine color.     MS:  Denies joint pain or swelling.   Derm:  Denies skin rash or itching.  No chronic skin conditions but has chronic skin changes on legs that she relates to prior falls Neuro:   Denies focal weakness, memory problems, hx stroke or TIA.   Psych:  Denies symptoms of depression of anxiety.  No hallucination.     Physical Exam:  Blood pressure 113/100, pulse 120, temperature 99 F (37.2 C), temperature source Oral, resp. rate 20, height '5\' 5"'  (1.651 m), weight 95.936 kg (211 lb 8 oz), SpO2 92.00%.  Gen: She is overall somewhat slow to respond, is appropriate, but can't recall too many details of her medical history. Obese, plethoric, mild periorbital edema Skin: no rash, cyanosis but has chronic darkening and skin changes LE's Neck: no JVD, no bruits or LAN Chest: Lung fields are posteriorly clear Heart: regular, no rub or gallop S1S2 No S3 2/6 murmur aortic area/LSB  No diastolic murmur or rub Abdomen: soft, obese, no focal tenderness Ext: No pitting edema of the LE's. Neuro: alert, Ox3, no focal deficit Heme/Lymph: no bruising or LAN  Recent Labs Lab 03/13/14 0851 03/14/14 0622 03/15/14 0551 03/16/14 0728 03/16/14 0835 03/17/14 0541  NA 129* 135* 134* 131*  --  134*  K 3.7 4.0 3.8 3.8  --  4.4  CL 92* 101 99 95*  --  98  CO2 21 22 18* 20  --  21  GLUCOSE 323* 198* 222* 225*  --  121*  BUN  28* 30* 38* 42*  --  48*  CREATININE 2.41* 2.20* 2.38* 2.71*  --  3.08*  CALCIUM 8.3* 7.5* 7.2* 7.4* 7.0* 7.0*  PHOS  --   --   --  2.7  --  2.2*    Recent Labs Lab 03/14/14 0622  AST 20  ALT 8  ALKPHOS 67  BILITOT 0.8  PROT 6.5  ALBUMIN 2.3*    Recent Labs Lab 03/13/14 0851 03/14/14 0622 03/15/14 0551 03/16/14 0728 03/17/14 0541  WBC 5.7 7.9 9.0 8.6 7.6  NEUTROABS 4.1  --  6.5  --   --   HGB 7.9* 8.2* 8.6* 8.5* 8.0*  HCT 23.3* 24.4* 25.3* 24.4* 24.3*  MCV 84.1 83.8 83.5 83.6 84.4  PLT 136* 149* 160 184 189   Recent Labs Lab 03/13/14 0845 03/14/14 0621  TROPONINI <0.30 <0.30   CBG:  Recent Labs Lab 03/16/14 1222 03/16/14 1647 03/16/14 2043 03/17/14 0740 03/17/14 1148  GLUCAP 174* 167* 113* 123* 132*    Iron Studies:  Recent Labs Lab 03/17/14 0541  IRON 21*  TIBC 146*  FERRITIN 858*    Xrays/Other Studies: Dg Chest 2 View  03/16/2014   CLINICAL DATA:  Fever.  EXAM: CHEST  2 VIEW  COMPARISON:  March 15, 2014.  FINDINGS: Stable cardiomegaly. No pneumothorax or pleural effusion is noted. No acute pulmonary disease is noted. Bony thorax appears intact  IMPRESSION: No acute cardiopulmonary abnormality seen.   Electronically Signed   By: Sabino Dick M.D.   On: 03/16/2014 12:16   Background: 68 yo AAF with SLE (she doen't recall how long) on chronic pred/plaquenil and methotrexate), DM, HTN, diastolic dysfunction and severe anemia, with acute kidney injury on chronic associated with proteinuria, low grade microscopic hematuria, + ANA, + atypical p-ANCA raising question of lupus nephritis vs overlap syndrome.  Picture confounded to some degree by ARB, NSAIDS and acute heart failure.  Will require diagnostic renal biopsy  Impression/Plan 1. Acute kidney injury - h/o SLE; + ANA and + atypical p-ANCA with proteinuria and hematuria. Schedule renal biopsy in ultrasound for Monday. Would pulse solumedrol 500 mg IV QD X 3 and continue pt's plaquenil. Avoid further  NSAIDS or ARB at this time. Strict intake and output. Stop IVF's to avoid further CHF issues.  (She does not look dry).  Will need to hold lovenox Sat/Sun for BX. Use SCD's 2. Anemia  - severe. Not sure what workup done to date. Was to have started procrit 40,000 once per week but had received only one dose PTA. Continue ESA as aranesp 200/week and give IV feraheme to treat fe deficiency. R/O hemolytic component 3. S/p acute on chronic diastolic CHF - stop IVF; change lasix to po 4. SLE - continue pred/plaquenil; would recommend getting Dr. Amil Amen involved for historical aspects of her lupus 5. H/o atrial flutter - SR at present 6. DM2 - sliding scale insulin; pulse steroids will affect DM control adversely 7. HTN - avoid ACE or ARB at this time 8. Fevers - reportedly fevers at [facility]; on empiric levaquin. CXR no PNA. UA does not look infected 9. Hyperparathyroidism - no Rx that I can see.  Start oral calcitriol  Jamal Maes,  MD [facility] [phone] pager 03/17/2014, 6:11 PM

## 2014-03-17 NOTE — Progress Notes (Addendum)
[facility] by [facility] . Pt is awake alert and oriented with lethargy and weakness   With exertional dyspnea . Placed a call to  Dr. Jeanell Sparrow for pt's arrival . Placed a call to  [employer] training [employer] education officer.Seen by renal consult IV d'cd Urine specimen sent.Voided freely to commode

## 2014-03-18 ENCOUNTER — Encounter (HOSPITAL_COMMUNITY): Payer: Self-pay | Admitting: Radiology

## 2014-03-18 DIAGNOSIS — E1142 Type 2 diabetes mellitus with diabetic polyneuropathy: Secondary | ICD-10-CM

## 2014-03-18 LAB — CBC
HEMATOCRIT: 21.7 % — AB (ref 36.0–46.0)
HEMOGLOBIN: 7.3 g/dL — AB (ref 12.0–15.0)
MCH: 27.7 pg (ref 26.0–34.0)
MCHC: 33.6 g/dL (ref 30.0–36.0)
MCV: 82.2 fL (ref 78.0–100.0)
Platelets: 181 10*3/uL (ref 150–400)
RBC: 2.64 MIL/uL — ABNORMAL LOW (ref 3.87–5.11)
RDW: 17.8 % — ABNORMAL HIGH (ref 11.5–15.5)
WBC: 7.5 10*3/uL (ref 4.0–10.5)

## 2014-03-18 LAB — RENAL FUNCTION PANEL
Albumin: 2 g/dL — ABNORMAL LOW (ref 3.5–5.2)
BUN: 53 mg/dL — AB (ref 6–23)
CO2: 16 mEq/L — ABNORMAL LOW (ref 19–32)
CREATININE: 3.23 mg/dL — AB (ref 0.50–1.10)
Calcium: 7.4 mg/dL — ABNORMAL LOW (ref 8.4–10.5)
Chloride: 95 mEq/L — ABNORMAL LOW (ref 96–112)
GFR calc Af Amer: 16 mL/min — ABNORMAL LOW (ref >90)
GFR calc non Af Amer: 14 mL/min — ABNORMAL LOW (ref >90)
GLUCOSE: 126 mg/dL — AB (ref 70–99)
PHOSPHORUS: 2.4 mg/dL (ref 2.3–4.6)
POTASSIUM: 4 meq/L (ref 3.7–5.3)
Sodium: 131 mEq/L — ABNORMAL LOW (ref 137–147)

## 2014-03-18 LAB — GLUCOSE, CAPILLARY
GLUCOSE-CAPILLARY: 125 mg/dL — AB (ref 70–99)
Glucose-Capillary: 133 mg/dL — ABNORMAL HIGH (ref 70–99)
Glucose-Capillary: 112 mg/dL — ABNORMAL HIGH (ref 70–99)
Glucose-Capillary: 163 mg/dL — ABNORMAL HIGH (ref 70–99)

## 2014-03-18 LAB — HAPTOGLOBIN: Haptoglobin: 243 mg/dL — ABNORMAL HIGH (ref 45–215)

## 2014-03-18 MED ORDER — SODIUM CHLORIDE 0.9 % IV SOLN
500.0000 mg | INTRAVENOUS | Status: AC
  Administered 2014-03-18 – 2014-03-20 (×3): 500 mg via INTRAVENOUS
  Filled 2014-03-18 (×3): qty 4

## 2014-03-18 MED ORDER — AMLODIPINE BESYLATE 10 MG PO TABS
10.0000 mg | ORAL_TABLET | Freq: Every day | ORAL | Status: DC
  Administered 2014-03-18 – 2014-03-27 (×10): 10 mg via ORAL
  Filled 2014-03-18 (×11): qty 1

## 2014-03-18 MED ORDER — ENOXAPARIN SODIUM 30 MG/0.3ML ~~LOC~~ SOLN: 30.0000 mg | SUBCUTANEOUS | Status: DC

## 2014-03-18 MED ORDER — LEVOFLOXACIN IN D5W 500 MG/100ML IV SOLN
500.0000 mg | INTRAVENOUS | Status: AC
  Administered 2014-03-18 – 2014-03-22 (×3): 500 mg via INTRAVENOUS
  Filled 2014-03-18 (×3): qty 100

## 2014-03-18 NOTE — Progress Notes (Addendum)
Background:  68 yo AAF with SLE (she doen't recall how long -  on chronic pred/plaquenil and methotrexate, followed by Dr. Amil Amen), DM, HTN, diastolic dysfunction and severe anemia, with acute kidney injury on chronic (baseline creatinine 1.2-1.4)  associated with proteinuria, low grade microscopic hematuria, + ANA, + atypical p-ANCA raising question of lupus nephritis vs overlap syndrome. Picture confounded to some degree by ARB, NSAIDS and acute heart failure. Will require diagnostic renal biopsy which is requested for Monday  Impression/Plan  1. Acute kidney injury - h/o SLE; + ANA and + atypical p-ANCA with proteinuria and hematuria. Schedule renal biopsy in ultrasound for Monday. Started pulse solumedrol 500 mg IV QD X 3 (did not get yesterday as order entered incorrectly and was not clarified; reordered for Q24 hours X 3 doses) and continued pt's plaquenil. Avoid further NSAIDS or ARB at this time. Strict intake and output. Stop IVF's to avoid further CHF issues. (She does not look dry). Will need to hold lovenox Sat/Sun for BX. Use SCD's. Creatinine 3.2 today 2. Anemia - severe. Not sure what workup done to date. Was to have started procrit 40,000 once per week but had received only one dose PTA. Continue ESA as aranesp 200/week and give IV feraheme to treat fe deficiency. Haptoglobin normal 3. S/p acute on chronic diastolic CHF - stopped IVF. Changed lasix to po. Volume stable today 4. SLE - continue pred/plaquenil. Would recommend getting Dr. Amil Amen involved for historical aspects of her lupus 5. H/o atrial flutter - SR at present 6. DM2 - sliding scale insulin; pulse steroids may affect DM control adversely 7. HTN - avoid ACE or ARB at this time; on BB. BP needs to be under [id number] systolic for bx. Add amlodipine 10 at hs. 8. Fevers - reportedly fevers at [facility]. n empiric levaquin. CXR no PNA. UA does not look infected 9. Hyperparathyroidism - no previous Rx that I can see. Start oral  calcitriol  Subjective:  Feels "pretty good" Up to the bedside commode Not dyspneic and denies pain  Objective Vital signs in last 24 hours: Filed Vitals:   03/17/14 2045 03/18/14 0224 03/18/14 0544 03/18/14 0847  BP: 156/58 160/82 150/50   Pulse: 92 90 92   Temp: 99.4 F (37.4 C) 99.2 F (37.3 C) 99.4 F (37.4 C)   TempSrc: Oral  Oral   Resp: 20 20 20    Height:      Weight:   95.64 kg (210 lb 13.6 oz)   SpO2:  98% 100% 99%   Weight change: -0.296 kg (-10.4 oz)  Intake/Output Summary (Last 24 hours) at 03/18/14 1355 Last data filed at 03/18/14 0900  Gross per 24 hour  Intake    688 ml  Output    350 ml  Net    338 ml   Physical Exam:  Blood pressure 150/50, pulse 92, temperature 99.4 F (37.4 C), temperature source Oral, resp. rate 20, height 5\' 5"  (1.651 m), weight 95.64 kg (210 lb 13.6 oz), SpO2 99.00%. Gen: Pleasant BF NAD  On the bedside commode Skin: no rash, cyanosis but has chronic darkening and skin changes LE's  Neck: no JVD, no bruits   Chest: Lung fields are posteriorly clear  Heart: regular, no rub or gallop S1S2 No S3 2/6 murmur aortic area/LSB  No diastolic murmur or rub  Abdomen: soft, obese, no focal tenderness  Ext: No pitting edema of the LE's.  Neuro: alert, Ox3, no focal deficit  Recent Labs Lab 03/13/14 0851 03/14/14 [id number]  03/15/14 0551 03/16/14 0728 03/16/14 0835 03/17/14 0541 03/17/14 1948 03/18/14 0348  NA 129* 135* 134* 131*  --  134* 131* 131*  K 3.7 4.0 3.8 3.8  --  4.4 4.1 4.0  CL 92* 101 99 95*  --  98 95* 95*  CO2 21 22 18* 20  --  21 19 16*  GLUCOSE 323* 198* 222* 225*  --  121* 134* 126*  BUN 28* 30* 38* 42*  --  48* 51* 53*  CREATININE 2.41* 2.20* 2.38* 2.71*  --  3.08* 3.20* 3.23*  CALCIUM 8.3* 7.5* 7.2* 7.4* 7.0* 7.0* 7.2* 7.4*  PHOS  --   --   --  2.7  --  2.2*  --  2.4    Recent Labs Lab 03/14/14 0622 03/17/14 1948 03/18/14 0348  AST 20 34  --   ALT 8 16  --   ALKPHOS 67 144*  --   BILITOT 0.8 1.1  --    PROT 6.5 7.0  --   ALBUMIN 2.3* 2.2* 2.0*    Recent Labs Lab 03/13/14 0851  03/15/14 0551 03/16/14 0728 03/17/14 0541 03/17/14 1948 03/18/14 0348  WBC 5.7  < > 9.0 8.6 7.6 8.3 7.5  NEUTROABS 4.1  --  6.5  --   --   --   --   HGB 7.9*  < > 8.6* 8.5* 8.0* 7.7* 7.3*  HCT 23.3*  < > 25.3* 24.4* 24.3* 23.1* 21.7*  MCV 84.1  < > 83.5 83.6 84.4 82.8 82.2  PLT 136*  < > 160 184 189 191 181  < > = values in this interval not displayed.  Recent Labs Lab 03/13/14 0845 03/14/14 0621  TROPONINI <0.30 <0.30    Recent Labs Lab 03/17/14 1148 03/17/14 1823 03/17/14 2058 03/18/14 0548 03/18/14 1126  GLUCAP 132* 77 103* 133* 112*     Recent Labs Lab 03/17/14 0541  IRON 21*  TIBC 146*  FERRITIN 858*   Results for KIMBELRY, ODENS (MRN [id number]) as of 03/18/2014 13:58  Ref. Range 03/17/2014 19:48  Haptoglobin Latest Range: 45-215 mg/dL 243 (H)  Results for ALLISSA, ZISK (MRN [id number]) as of 03/18/2014 13:58  Ref. Range 03/13/2014 09:10 03/14/2014 18:19 03/17/2014 09:26  Color, Urine Latest Range: YELLOW  YELLOW YELLOW YELLOW  APPearance Latest Range: CLEAR  CLEAR HAZY (A) HAZY (A)  Specific Gravity, Urine Latest Range: 1.005-1.030  >1.030 (H) 1.025 >1.030 (H)  pH Latest Range: 5.0-8.0  5.5 5.5 5.5  Glucose Latest Range: NEGATIVE mg/dL NEGATIVE NEGATIVE NEGATIVE  Bilirubin Urine Latest Range: NEGATIVE  SMALL (A) NEGATIVE NEGATIVE  Ketones, ur Latest Range: NEGATIVE mg/dL NEGATIVE NEGATIVE NEGATIVE  Protein Latest Range: NEGATIVE mg/dL 100 (A) 100 (A) >300 (A)  Urobilinogen, UA Latest Range: 0.0-1.0 mg/dL 0.2 0.2 1.0  Nitrite Latest Range: NEGATIVE  NEGATIVE NEGATIVE NEGATIVE  Leukocytes, UA Latest Range: NEGATIVE  NEGATIVE NEGATIVE NEGATIVE  Hgb urine dipstick Latest Range: NEGATIVE  TRACE (A) MODERATE (A) MODERATE (A)  WBC, UA Latest Range: <3 WBC/hpf  0-2 7-10  RBC / HPF Latest Range: <3 RBC/hpf 3-6 0-2 3-6  Squamous Epithelial / LPF Latest Range: RARE   MANY (A)  MANY (A)  Bacteria, UA Latest Range: RARE  MANY (A) MANY (A) MANY (A)  Casts Latest Range: NEGATIVE   HYALINE CASTS (A)    Medications:   . albuterol  3 mL Inhalation BID  . antiseptic oral rinse  15 mL Mouth Rinse q12n4p  . aspirin EC  81 mg  Oral Daily  . atorvastatin  40 mg Oral q1800  . calcitRIOL  0.25 mcg Oral Daily  . chlorhexidine  15 mL Mouth Rinse BID  . darbepoetin (ARANESP) injection - NON-DIALYSIS  200 mcg Subcutaneous Q Sat-1800  . ferumoxytol  510 mg Intravenous Q3 days  . folic acid  1 mg Oral Daily  . furosemide  80 mg Oral BID  . hydroxychloroquine  200 mg Oral BID  . insulin aspart  0-20 Units Subcutaneous TID WC  . insulin aspart  0-5 Units Subcutaneous QHS  . insulin aspart  2 Units Subcutaneous TID WC  . levofloxacin (LEVAQUIN) IV  500 mg Intravenous Q48H  . metoprolol  50 mg Oral BID  . predniSONE  2.5 mg Oral Q breakfast  . sodium bicarbonate  650 mg Oral TID  . sodium chloride  3 mL Intravenous Q12H  . Vitamin D (Ergocalciferol)  50,000 Units Oral Q7 days      Jamal Maes, MD [facility] [phone] pager 03/18/2014, 1:55 PM

## 2014-03-18 NOTE — Progress Notes (Signed)
SUBJECTIVE:  No pain.  She is sleepy.     PHYSICAL EXAM Filed Vitals:   03/17/14 2045 03/18/14 0224 03/18/14 0544 03/18/14 0847  BP: 156/58 160/82 150/50   Pulse: 92 90 92   Temp: 99.4 F (37.4 C) 99.2 F (37.3 C) 99.4 F (37.4 C)   TempSrc: Oral  Oral   Resp: 20 20 20    Height:      Weight:   210 lb 13.6 oz (95.64 kg)   SpO2:  98% 100% 99%   General:  No distress Lungs:  Clear Heart:  RRR Abdomen:  Positive bowel sounds, no rebound no guarding Extremities:  No edema   LABS:  Results for orders placed during the hospital encounter of 03/13/14 (from the past 24 hour(s))  GLUCOSE, CAPILLARY     Status: Abnormal   Collection Time    03/17/14 11:48 AM      Result Value Ref Range   Glucose-Capillary 132 (*) 70 - 99 mg/dL   Comment 1 Notify RN    GLUCOSE, CAPILLARY     Status: None   Collection Time    03/17/14  6:23 PM      Result Value Ref Range   Glucose-Capillary 77  70 - 99 mg/dL  URINALYSIS, ROUTINE W REFLEX MICROSCOPIC     Status: Abnormal   Collection Time    03/17/14  6:30 PM      Result Value Ref Range   Color, Urine AMBER (*) YELLOW   APPearance CLOUDY (*) CLEAR   Specific Gravity, Urine 1.023  1.005 - 1.030   pH 5.0  5.0 - 8.0   Glucose, UA NEGATIVE  NEGATIVE mg/dL   Hgb urine dipstick MODERATE (*) NEGATIVE   Bilirubin Urine SMALL (*) NEGATIVE   Ketones, ur NEGATIVE  NEGATIVE mg/dL   Protein, ur >300 (*) NEGATIVE mg/dL   Urobilinogen, UA 1.0  0.0 - 1.0 mg/dL   Nitrite NEGATIVE  NEGATIVE   Leukocytes, UA LARGE (*) NEGATIVE  PROTEIN / CREATININE RATIO, URINE     Status: Abnormal   Collection Time    03/17/14  6:30 PM      Result Value Ref Range   Creatinine, Urine 145.63     Total Protein, Urine 421.2     PROTEIN CREATININE RATIO 2.89 (*) 0.00 - 0.15  URINE MICROSCOPIC-ADD ON     Status: Abnormal   Collection Time    03/17/14  6:30 PM      Result Value Ref Range   Squamous Epithelial / LPF FEW (*) RARE   WBC, UA TOO NUMEROUS TO COUNT  <3  WBC/hpf   RBC / HPF 0-2  <3 RBC/hpf   Bacteria, UA FEW (*) RARE   Urine-Other TRICHOMONAS PRESENT    CBC     Status: Abnormal   Collection Time    03/17/14  7:48 PM      Result Value Ref Range   WBC 8.3  4.0 - 10.5 K/uL   RBC 2.79 (*) 3.87 - 5.11 MIL/uL   Hemoglobin 7.7 (*) 12.0 - 15.0 g/dL   HCT 23.1 (*) 36.0 - 46.0 %   MCV 82.8  78.0 - 100.0 fL   MCH 27.6  26.0 - 34.0 pg   MCHC 33.3  30.0 - 36.0 g/dL   RDW 17.7 (*) 11.5 - 15.5 %   Platelets 191  150 - 400 K/uL  HAPTOGLOBIN     Status: Abnormal   Collection Time    03/17/14  7:48  PM      Result Value Ref Range   Haptoglobin 243 (*) 45 - 215 mg/dL  COMPREHENSIVE METABOLIC PANEL     Status: Abnormal   Collection Time    03/17/14  7:48 PM      Result Value Ref Range   Sodium 131 (*) 137 - 147 mEq/L   Potassium 4.1  3.7 - 5.3 mEq/L   Chloride 95 (*) 96 - 112 mEq/L   CO2 19  19 - 32 mEq/L   Glucose, Bld 134 (*) 70 - 99 mg/dL   BUN 51 (*) 6 - 23 mg/dL   Creatinine, Ser 3.20 (*) 0.50 - 1.10 mg/dL   Calcium 7.2 (*) 8.4 - 10.5 mg/dL   Total Protein 7.0  6.0 - 8.3 g/dL   Albumin 2.2 (*) 3.5 - 5.2 g/dL   AST 34  0 - 37 U/L   ALT 16  0 - 35 U/L   Alkaline Phosphatase 144 (*) 39 - 117 U/L   Total Bilirubin 1.1  0.3 - 1.2 mg/dL   GFR calc non Af Amer 14 (*) >90 mL/min   GFR calc Af Amer 16 (*) >90 mL/min  GLUCOSE, CAPILLARY     Status: Abnormal   Collection Time    03/17/14  8:58 PM      Result Value Ref Range   Glucose-Capillary 103 (*) 70 - 99 mg/dL  RENAL FUNCTION PANEL     Status: Abnormal   Collection Time    03/18/14  3:48 AM      Result Value Ref Range   Sodium 131 (*) 137 - 147 mEq/L   Potassium 4.0  3.7 - 5.3 mEq/L   Chloride 95 (*) 96 - 112 mEq/L   CO2 16 (*) 19 - 32 mEq/L   Glucose, Bld 126 (*) 70 - 99 mg/dL   BUN 53 (*) 6 - 23 mg/dL   Creatinine, Ser 3.23 (*) 0.50 - 1.10 mg/dL   Calcium 7.4 (*) 8.4 - 10.5 mg/dL   Phosphorus 2.4  2.3 - 4.6 mg/dL   Albumin 2.0 (*) 3.5 - 5.2 g/dL   GFR calc non Af Amer 14 (*)  >90 mL/min   GFR calc Af Amer 16 (*) >90 mL/min  CBC     Status: Abnormal   Collection Time    03/18/14  3:48 AM      Result Value Ref Range   WBC 7.5  4.0 - 10.5 K/uL   RBC 2.64 (*) 3.87 - 5.11 MIL/uL   Hemoglobin 7.3 (*) 12.0 - 15.0 g/dL   HCT 21.7 (*) 36.0 - 46.0 %   MCV 82.2  78.0 - 100.0 fL   MCH 27.7  26.0 - 34.0 pg   MCHC 33.6  30.0 - 36.0 g/dL   RDW 17.8 (*) 11.5 - 15.5 %   Platelets 181  150 - 400 K/uL  GLUCOSE, CAPILLARY     Status: Abnormal   Collection Time    03/18/14  5:48 AM      Result Value Ref Range   Glucose-Capillary 133 (*) 70 - 99 mg/dL    Intake/Output Summary (Last 24 hours) at 03/18/14 1108 Last data filed at 03/18/14 0900  Gross per 24 hour  Intake    928 ml  Output    650 ml  Net    278 ml    ASSESSMENT AND PLAN:  ACUTE ON CHRONIC DIASTOLIC HF:   She is now on PO diuretic and this seems to be keeping I/O  relatively even.  Continue current dose.    PULMONARY HTN:  Plan right heart cath.  However, per Dr. Nelly Laurence note this might not be done this admission.    HTN:  Mildly elevated.  Continue current therapy.   ACUTE ON CHRONIC KIDNEY INJURY:  Nephrology is following and biopsy is pending.  Avoiding ACE/ARB.  Pulsing steroids.   Creat is stable.    Jeneen Rinks [facility] 03/18/2014 11:08 AM

## 2014-03-18 NOTE — Progress Notes (Signed)
Petal for Levaquin  Indication: febrile illness?   No Known Allergies  Patient Measurements: Height: 5\' 5"  (165.1 cm) Weight: 210 lb 13.6 oz (95.64 kg) IBW/kg (Calculated) : 57  Vital Signs: Temp: 99.4 F (37.4 C) (06/20 0544) Temp src: Oral (06/20 0544) BP: 150/50 mmHg (06/20 0544) Pulse Rate: 92 (06/20 0544) Intake/Output from previous day: 06/19 0701 - 06/20 0700 In: 1048 [P.O.:1048] Out: 750 [Urine:750] Intake/Output from this shift:    Labs:  Recent Labs  03/15/14 1731  03/17/14 0541 03/17/14 1830 03/17/14 1948 03/18/14 0348  WBC  --   < > 7.6  --  8.3 7.5  HGB  --   < > 8.0*  --  7.7* 7.3*  PLT  --   < > 189  --  191 181  LABCREA 69.48  --   --  145.63  --   --   CREATININE  --   < > 3.08*  --  3.20* 3.23*  < > = values in this interval not displayed. Estimated Creatinine Clearance: 19.1 ml/min (by C-G formula based on Cr of 3.23). No results found for this basename: Letta Median, VANCORANDOM, GENTTROUGH, GENTPEAK, GENTRANDOM, TOBRATROUGH, TOBRAPEAK, TOBRARND, AMIKACINPEAK, AMIKACINTROU, AMIKACIN,  in the last 72 hours   Microbiology: Recent Results (from the past 720 hour(s))  URINE CULTURE     Status: None   Collection Time    03/13/14  9:10 AM      Result Value Ref Range Status   Specimen Description URINE, CATHETERIZED   Final   Special Requests NONE   Final   Culture  Setup Time     Final   Value: 03/13/2014 23:47     Performed at [place]     Final   Value: NO GROWTH     Performed at [facility]   Culture     Final   Value: NO GROWTH     Performed at [facility]   Report Status 03/14/2014 FINAL   Final  MRSA PCR SCREENING     Status: None   Collection Time    03/14/14  6:02 PM      Result Value Ref Range Status   MRSA by PCR NEGATIVE  NEGATIVE Final   Comment:            The GeneXpert MRSA Assay (FDA     approved for NASAL specimens     only), is  one component of a     comprehensive MRSA colonization     surveillance program. It is not     intended to diagnose MRSA     infection nor to guide or     monitor treatment for     MRSA infections.  URINE CULTURE     Status: None   Collection Time    03/14/14  6:34 PM      Result Value Ref Range Status   Specimen Description URINE, CATHETERIZED   Final   Special Requests IMMUNE:COMPROMISED   Final   Culture  Setup Time     Final   Value: 03/15/2014 14:46     Performed at [place]     Final   Value: NO GROWTH     Performed at [facility]   Culture     Final   Value: NO GROWTH     Performed at [facility]   Report Status 03/16/2014  FINAL   Final  CULTURE, BLOOD (ROUTINE X 2)     Status: None   Collection Time    03/14/14  6:59 PM      Result Value Ref Range Status   Specimen Description BLOOD RIGHT ARM POSSIBLE TRANSFUSION REACTION   Final   Special Requests     Final   Value: BOTTLES DRAWN AEROBIC AND ANAEROBIC AEB=6CC ANA=7CC   Culture NO GROWTH 4 DAYS   Final   Report Status PENDING   Incomplete  CULTURE, BLOOD (ROUTINE X 2)     Status: None   Collection Time    03/14/14  7:26 PM      Result Value Ref Range Status   Specimen Description BLOOD LEFT ARM POSSIBLE TRANSFUSION REACTION   Final   Special Requests BOTTLES DRAWN AEROBIC ONLY 4CC   Final   Culture NO GROWTH 4 DAYS   Final   Report Status PENDING   Incomplete  CULTURE, BLOOD (ROUTINE X 2)     Status: None   Collection Time    03/17/14  9:11 AM      Result Value Ref Range Status   Specimen Description BLOOD RIGHT HAND   Final   Special Requests BOTTLES DRAWN AEROBIC ONLY 10CC   Final   Culture NO GROWTH 1 DAY   Final   Report Status PENDING   Incomplete  CULTURE, BLOOD (ROUTINE X 2)     Status: None   Collection Time    03/17/14  9:17 AM      Result Value Ref Range Status   Specimen Description BLOOD LEFT HAND   Final   Special Requests BOTTLES DRAWN AEROBIC ONLY  8CC   Final   Culture NO GROWTH 1 DAY   Final   Report Status PENDING   Incomplete    Anti-infectives   Start     Dose/Rate Route Frequency Ordered Stop   03/18/14 1200  levofloxacin (LEVAQUIN) IVPB 750 mg     750 mg 100 mL/hr over 90 Minutes Intravenous Every 48 hours 03/16/14 1032     03/16/14 1200  levofloxacin (LEVAQUIN) IVPB 750 mg     750 mg 100 mL/hr over 90 Minutes Intravenous  Once 03/16/14 1024 03/16/14 1354   03/14/14 2000  vancomycin (VANCOCIN) IVPB 1000 mg/200 mL premix  Status:  Discontinued     1,000 mg 200 mL/hr over 60 Minutes Intravenous Every 24 hours 03/14/14 1848 03/16/14 1018   03/14/14 2000  piperacillin-tazobactam (ZOSYN) IVPB 3.375 g  Status:  Discontinued     3.375 g 12.5 mL/hr over 240 Minutes Intravenous Every 8 hours 03/14/14 1848 03/16/14 1018   03/13/14 1500  hydroxychloroquine (PLAQUENIL) tablet 200 mg     200 mg Oral 2 times daily 03/13/14 1314       Past Medical History  Diagnosis Date  . Asthma   . Essential hypertension, benign   . Type 2 diabetes mellitus   . SLE (systemic lupus erythematosus)   . Anemia   . Atrial flutter     TEE/DCCV December 2013 - on Xarelto [facility])  . CKD (chronic kidney disease) stage 3, GFR 30-59 ml/min    Medications Prior to Admission  Medication Sig Dispense Refill  . acetaminophen (TYLENOL) 500 MG tablet Take 1,000 mg by mouth every 6 (six) hours as needed. For pain      . albuterol (PROVENTIL HFA;VENTOLIN HFA) 108 (90 BASE) MCG/ACT inhaler Inhale 2 puffs into the lungs every 6 (six) hours as needed. For shortness  of breath      . aspirin EC 81 MG tablet Take 81 mg by mouth daily.      . folic acid (FOLVITE) 1 MG tablet Take 1 mg by mouth daily.      . furosemide (LASIX) 20 MG tablet Take 40 mg by mouth 3 (three) times daily as needed for fluid.       . hydroxychloroquine (PLAQUENIL) 200 MG tablet Take 200 mg by mouth 2 (two) times daily.      Marland Kitchen losartan-hydrochlorothiazide (HYZAAR) 100-25 MG per tablet Take 1  tablet by mouth daily.      . meloxicam (MOBIC) 7.5 MG tablet Take 7.5 mg by mouth 2 (two) times daily.      . metFORMIN (GLUCOPHAGE) 500 MG tablet Take by mouth 2 (two) times daily with a meal.      . methotrexate (RHEUMATREX) 2.5 MG tablet Take 10 mg by mouth once a week. Caution:Chemotherapy. Protect from light.      . metoprolol (LOPRESSOR) 50 MG tablet Take 1 tablet (50 mg total) by mouth 2 (two) times daily.  60 tablet  11  . Multiple Vitamins-Minerals (CENTRUM SILVER PO) Take 1 tablet by mouth daily.      . predniSONE (DELTASONE) 2.5 MG tablet Take 2.5 mg by mouth daily with breakfast.      . saxagliptin HCl (ONGLYZA) 2.5 MG TABS tablet Take 2.5 mg by mouth daily.      . simvastatin (ZOCOR) 80 MG tablet Take 80 mg by mouth daily.      . Vitamin D, Ergocalciferol, (DRISDOL) 50000 UNITS CAPS capsule Take 50,000 Units by mouth every 7 (seven) days. Tuesday.       Assessment: 68 yo female now presents form outside hospital for renal work up. Patient is on antibiotics due to previous low grade fevers. Source unclear- repeating CXR today. Cx data has been negative.  UA showed moderate LE and TNTC wbc. Patients is afeb, wbc wnl, and has renal dysfunction  Levaquin 6/18>> Vancomycin 6/16>>6/18 Zosyn 6/16>>6/18  Plan:  Adjust Levaquin 500mg  IV q48h Follow-up micro data, labs, vitals.   Gwendlyn Deutscher M 03/18/2014,8:51 AM

## 2014-03-18 NOTE — Progress Notes (Signed)
[facility] PROGRESS NOTE  [name] [id number] DOB: March 28, 1946 DOA: 03/13/2014 PCP: [name], MD  Summary:  This is a 68 year old female with history of lupus and chronic kidney disease stage III (baseline creatinine 1.2) and chronic anemia who presents to the emergency room with generalized weakness. She was found to have a hemoglobin of 7.3 and creatinine of 2. It was felt that her weakness may be related to her anemia she was transfused 1 unit of PRBCs and started on IV fluids. The patient subsequently developed volume overload, shortness of breath and required transfer to the step down unit for BiPAP therapy. She was seen by cardiology and started on intravenous Lasix. Her respiratory status subsequently improved and she's been weaned back down to 2 L of oxygen. She is no longer short of breath. Unfortunately, her creatinine has continued to rise. Currently it is 3 and her urine output is now unimpressive. She's been having intermittent fevers as high as 103. Renal workup showed positive ANA, double-stranded DNA as well as positive p-ANCA. It was felt the patient would likely need a renal biopsy to further identify the cause of her renal failure. If she does in fact have an anchor nephropathy, she would need further treatments including steroids, cyclophosphamide and plasmapheresis. Recommendations were to transfer the patient to [facility] for further workup. Regarding her fevers, she was started on empiric antibiotics but does not have any clear source of infection. She does not appear septic and her fevers may relate to her underlying connective tissue disease/vasculitis.  Assessment/Plan: Acute respiratory failure with hypoxia  Secondary to acute on chronic diastolic heart failure in setting of worsening anemia. Patient given IV fluids for renal failure and transfusions for anemia on admission. Developed worsening sob with hypoxia. Transferred to ICU on BiPap. She  was started on Lasix and initially had diuresis with improvement in her respiratory status.  Echo with grade 4 diastolic dysfunction, BNP elevated, chest xray consistent with CHF. Cardiology was following the patient and felt that her respiratory failure may have been due to rapid changes in volume status. She has now been weaned down to 2 L of oxygen.   Renal failure (ARF), acute on chronic:  -per renal service -h/o SLE; + ANA and + atypical p-ANCA with proteinuria and hematuria. Schedule renal biopsy in ultrasound for Monday. Would pulse solumedrol 500 mg IV QD X 3 and continue pt's plaquenil. Avoid further NSAIDS or ARB at this time. Strict intake and output. Stop IVF's to avoid further CHF issues. (She does not look dry). Will need to hold lovenox Sat/Sun for BX. Use SCD's  Fever  -Patient now afebrile -continue empirically therapy with levaquin -fever may be related to her underlying connective tissue disorder/vasculitis.   Hyponatremia:  -Likely related to #2.  -mproving  -will monitor  Anemia in chronic renal disease and Lupus:  -Somewhat symptomatic on presentation with hemoglobin of 7.3. S/p 1 unit PRBC's since admission.  -will continue now aranesp and IV iron -follow Hgb trend and transfuse if less than 7 -no source of bleeding appreciated  Essential hypertension, benign:  -Appears controlled. home medications include losartan and HCTZ which are on hold for now (due to worsening kidney function) -continue her beta blocker and monitor closely.  -if needed will use BIDIL  COPD (chronic obstructive pulmonary disease):  Appears stable at baseline. Will continue her home medications and nebs  -no wheezing  Atrial flutter:  -Chart review indicates recent followup with cardiology. -sinus rhythm on  telemetry and EKG -no anticoagulation currently as renal biopsy anticipated on Monday (6/22) -rate controlled -will follow cardiology rec's   Morbid obesity:  BMI 34.3.   -encourage to follow low calorie diet and to increase exercise  Type 2 diabetes mellitus with diabetic neuropathy:  -Hemoglobin A1c 6.9. Will hold her oral agents for now. Reasonable control with sliding scale insulin and low-dose meal coverage. -Will follow and adjust coverage especially while receiving steroids   SLE (systemic lupus erythematosus):  Stable.  -plan is to continue prednisone and plaquenil eventually -currently receiving pulse steroid therapy as recommended by renal service X 3 days  Code Status: full Family Communication: Discussed with husband at the bedside Disposition Plan:    Consultants:  Cardiology  nephrology  Procedures: echo 03/14/14 Normal LV function EF 50-55% with mild to mod LVH,Grade 4 diastolic dysfunction, mod AI, mild MR, mod-severe TR, PA peak pressure 61 mm Hg.   Kidney biopsy (anticipated for 6/22)   Antibiotics: Vancomycin 03/14/14>> 6/18 Zosyn 03/14/14>> 6/18 Levaquin 6/18>>  HPI/Subjective: Feeling ok; reports to be just tired. No CP or significant complaints of SOB. Afebrile   Objective: Filed Vitals:   03/18/14 0544  BP: 150/50  Pulse: 92  Temp: 99.4 F (37.4 C)  Resp: 20    Intake/Output Summary (Last 24 hours) at 03/18/14 1340 Last data filed at 03/18/14 0900  Gross per 24 hour  Intake    688 ml  Output    350 ml  Net    338 ml   Filed Weights   03/16/14 0500 03/17/14 0432 03/18/14 0544  Weight: 94.7 kg (208 lb 12.4 oz) 95.936 kg (211 lb 8 oz) 95.64 kg (210 lb 13.6 oz)    Exam:   General:  Afebrile, NAD, AAOX3; denies CP and SOB  Cardiovascular: Regular rate, +murmur, no rubs or gallops; trace edema bilaterally  Respiratory: normal effort with conversation. Decrease BS at bases, no frank crackles on exam. No wheeze  Abdomen: obese soft +BS non-tender to palpation  Musculoskeletal:  Joints without swelling/erythema  Data Reviewed: Basic Metabolic Panel:  Recent Labs Lab 03/15/14 0551 03/16/14 0728  03/16/14 0835 03/17/14 0541 03/17/14 1948 03/18/14 0348  NA 134* 131*  --  134* 131* 131*  K 3.8 3.8  --  4.4 4.1 4.0  CL 99 95*  --  98 95* 95*  CO2 18* 20  --  21 19 16*  GLUCOSE 222* 225*  --  121* 134* 126*  BUN 38* 42*  --  48* 51* 53*  CREATININE 2.38* 2.71*  --  3.08* 3.20* 3.23*  CALCIUM 7.2* 7.4* 7.0* 7.0* 7.2* 7.4*  PHOS  --  2.7  --  2.2*  --  2.4   Liver Function Tests:  Recent Labs Lab 03/14/14 0622 03/17/14 1948 03/18/14 0348  AST 20 34  --   ALT 8 16  --   ALKPHOS 67 144*  --   BILITOT 0.8 1.1  --   PROT 6.5 7.0  --   ALBUMIN 2.3* 2.2* 2.0*   No results found for this basename: LIPASE, AMYLASE,  in the last 168 hours No results found for this basename: AMMONIA,  in the last 168 hours CBC:  Recent Labs Lab 03/13/14 0851  03/15/14 0551 03/16/14 0728 03/17/14 0541 03/17/14 1948 03/18/14 0348  WBC 5.7  < > 9.0 8.6 7.6 8.3 7.5  NEUTROABS 4.1  --  6.5  --   --   --   --   HGB 7.9*  < >  8.6* 8.5* 8.0* 7.7* 7.3*  HCT 23.3*  < > 25.3* 24.4* 24.3* 23.1* 21.7*  MCV 84.1  < > 83.5 83.6 84.4 82.8 82.2  PLT 136*  < > 160 184 189 191 181  < > = values in this interval not displayed. Cardiac Enzymes:  Recent Labs Lab 03/13/14 0845 03/14/14 0621  TROPONINI <0.30 <0.30   BNP (last 3 results)  Recent Labs  03/15/14 0551  PROBNP 33778.0*   CBG:  Recent Labs Lab 03/17/14 1148 03/17/14 1823 03/17/14 2058 03/18/14 0548 03/18/14 1126  GLUCAP 132* 77 103* 133* 112*    Recent Results (from the past 240 hour(s))  URINE CULTURE     Status: None   Collection Time    03/13/14  9:10 AM      Result Value Ref Range Status   Specimen Description URINE, CATHETERIZED   Final   Special Requests NONE   Final   Culture  Setup Time     Final   Value: 03/13/2014 23:47     Performed at [place]     Final   Value: NO GROWTH     Performed at [facility]   Culture     Final   Value: NO GROWTH     Performed at [facility]   Report Status 03/14/2014 FINAL   Final  MRSA PCR SCREENING     Status: None   Collection Time    03/14/14  6:02 PM      Result Value Ref Range Status   MRSA by PCR NEGATIVE  NEGATIVE Final   Comment:            The GeneXpert MRSA Assay (FDA     approved for NASAL specimens     only), is one component of a     comprehensive MRSA colonization     surveillance program. It is not     intended to diagnose MRSA     infection nor to guide or     monitor treatment for     MRSA infections.  URINE CULTURE     Status: None   Collection Time    03/14/14  6:34 PM      Result Value Ref Range Status   Specimen Description URINE, CATHETERIZED   Final   Special Requests IMMUNE:COMPROMISED   Final   Culture  Setup Time     Final   Value: 03/15/2014 14:46     Performed at [place]     Final   Value: NO GROWTH     Performed at [facility]   Culture     Final   Value: NO GROWTH     Performed at [facility]   Report Status 03/16/2014 FINAL   Final  CULTURE, BLOOD (ROUTINE X 2)     Status: None   Collection Time    03/14/14  6:59 PM      Result Value Ref Range Status   Specimen Description BLOOD RIGHT ARM POSSIBLE TRANSFUSION REACTION   Final   Special Requests     Final   Value: BOTTLES DRAWN AEROBIC AND ANAEROBIC AEB=6CC ANA=7CC   Culture NO GROWTH 4 DAYS   Final   Report Status PENDING   Incomplete  CULTURE, BLOOD (ROUTINE X 2)     Status: None   Collection Time    03/14/14  7:26 PM      Result Value  Ref Range Status   Specimen Description BLOOD LEFT ARM POSSIBLE TRANSFUSION REACTION   Final   Special Requests BOTTLES DRAWN AEROBIC ONLY 4CC   Final   Culture NO GROWTH 4 DAYS   Final   Report Status PENDING   Incomplete  CULTURE, BLOOD (ROUTINE X 2)     Status: None   Collection Time    03/17/14  9:11 AM      Result Value Ref Range Status   Specimen Description BLOOD RIGHT HAND   Final   Special Requests BOTTLES DRAWN AEROBIC  ONLY 10CC   Final   Culture NO GROWTH 1 DAY   Final   Report Status PENDING   Incomplete  CULTURE, BLOOD (ROUTINE X 2)     Status: None   Collection Time    03/17/14  9:17 AM      Result Value Ref Range Status   Specimen Description BLOOD LEFT HAND   Final   Special Requests BOTTLES DRAWN AEROBIC ONLY 8CC   Final   Culture NO GROWTH 1 DAY   Final   Report Status PENDING   Incomplete     Studies: No results found.  Scheduled Meds: . albuterol  3 mL Inhalation BID  . antiseptic oral rinse  15 mL Mouth Rinse q12n4p  . aspirin EC  81 mg Oral Daily  . atorvastatin  40 mg Oral q1800  . calcitRIOL  0.25 mcg Oral Daily  . chlorhexidine  15 mL Mouth Rinse BID  . darbepoetin (ARANESP) injection - NON-DIALYSIS  200 mcg Subcutaneous Q Sat-1800  . ferumoxytol  510 mg Intravenous Q3 days  . folic acid  1 mg Oral Daily  . furosemide  80 mg Oral BID  . hydroxychloroquine  200 mg Oral BID  . insulin aspart  0-20 Units Subcutaneous TID WC  . insulin aspart  0-5 Units Subcutaneous QHS  . insulin aspart  2 Units Subcutaneous TID WC  . levofloxacin (LEVAQUIN) IV  500 mg Intravenous Q48H  . metoprolol  50 mg Oral BID  . predniSONE  2.5 mg Oral Q breakfast  . sodium bicarbonate  650 mg Oral TID  . sodium chloride  3 mL Intravenous Q12H  . Vitamin D (Ergocalciferol)  50,000 Units Oral Q7 days   Continuous Infusions:    Principal Problem:   Renal failure (ARF), acute on chronic Active Problems:   Atrial flutter   Morbid obesity   Type 2 diabetes mellitus with diabetic neuropathy   SLE (systemic lupus erythematosus)   Essential hypertension, benign   COPD (chronic obstructive pulmonary disease)   Anemia in chronic renal disease   Dehydration   Hyponatremia   Acute on chronic renal failure   Dyspnea   Diastolic CHF, acute on chronic   Acute respiratory failure with hypoxia   Fever, unspecified   Acute on chronic diastolic congestive heart failure    Time spent: >30  minutes  Barton Dubois  [facility] Pager [phone]. If 7PM-7AM, please contact night-coverage at [email or web address], password [facility] 03/18/2014, 1:40 PM  LOS: 5 days

## 2014-03-18 NOTE — H&P (Signed)
Chief Complaint: "Weakness" Referring Physician: Dr. Lorrene [name] HPI: [name] is an 68 y.o. female with PMHx of SLE, DM, HTN, COPD, atrial flutter, diastolic dysfunction, AR, MR, TR AND anemia receiving procrit. The patient presented c/o generalized weakness and DOE, found to have acute kidney injury with proteinuria, hematuria, (+) ANA and IR received request for renal biopsy. The patient states her shortness of breath has improved. She denies any chest pain. She denies any dark colored urine, decreased urine output, gross blood or foamy urine.   Past Medical History:  Past Medical History  Diagnosis Date  . Asthma   . Essential hypertension, benign   . Type 2 diabetes mellitus   . SLE (systemic lupus erythematosus)   . Anemia   . Atrial flutter     TEE/DCCV December 2013 - on Xarelto [facility])  . CKD (chronic kidney disease) stage 3, GFR 30-59 ml/min     Past Surgical History:  Past Surgical History  Procedure Laterality Date  . Ankle surgery  1993  . Back surgery  1980  . Abdominal hysterectomy  1983  . Tee without cardioversion  09/16/2012    Procedure: TRANSESOPHAGEAL ECHOCARDIOGRAM (TEE);  Surgeon: Sanda Klein, MD;  Location: [facility];  Service: Cardiovascular;  Laterality: N/A;  . Cardioversion  09/16/2012    Procedure: CARDIOVERSION;  Surgeon: Sanda Klein, MD;  Location: [facility] ENDOSCOPY;  Service: Cardiovascular;  Laterality: N/A;  . Esophagogastroduodenoscopy  09/20/2012    Procedure: ESOPHAGOGASTRODUODENOSCOPY (EGD);  Surgeon: Cleotis Nipper, MD;  Location: [facility] ENDOSCOPY;  Service: Endoscopy;  Laterality: Left;  . Colonoscopy  09/20/2012    Procedure: COLONOSCOPY;  Surgeon: Cleotis Nipper, MD;  Location: [facility] ENDOSCOPY;  Service: Endoscopy;  Laterality: Left;    Family History:  Family History  Problem Relation Age of Onset  . Diabetes Sister   . Diabetes Brother   . Diabetes Father   . Hypertension Father   . Hypertension Sister   . Hypertension Brother    . Stroke Mother     Social History:  reports that she has quit smoking. Her smoking use included Cigarettes. She has a 30 pack-year smoking history. She does not have any smokeless tobacco history on file. She reports that she does not drink alcohol or use illicit drugs.  Allergies: No Known Allergies  Medications:   Medication List    ASK your doctor about these medications       acetaminophen 500 MG tablet  Commonly known as:  TYLENOL  Take 1,000 mg by mouth every 6 (six) hours as needed. For pain     albuterol 108 (90 BASE) MCG/ACT inhaler  Commonly known as:  PROVENTIL HFA;VENTOLIN HFA  Inhale 2 puffs into the lungs every 6 (six) hours as needed. For shortness of breath     aspirin EC 81 MG tablet  Take 81 mg by mouth daily.     CENTRUM SILVER PO  Take 1 tablet by mouth daily.     folic acid 1 MG tablet  Commonly known as:  FOLVITE  Take 1 mg by mouth daily.     furosemide 20 MG tablet  Commonly known as:  LASIX  Take 40 mg by mouth 3 (three) times daily as needed for fluid.     hydroxychloroquine 200 MG tablet  Commonly known as:  PLAQUENIL  Take 200 mg by mouth 2 (two) times daily.     losartan-hydrochlorothiazide 100-25 MG per tablet  Commonly known as:  HYZAAR  Take 1 tablet by mouth  daily.     meloxicam 7.5 MG tablet  Commonly known as:  MOBIC  Take 7.5 mg by mouth 2 (two) times daily.     metFORMIN 500 MG tablet  Commonly known as:  GLUCOPHAGE  Take by mouth 2 (two) times daily with a meal.     methotrexate 2.5 MG tablet  Commonly known as:  RHEUMATREX  Take 10 mg by mouth once a week. Caution:Chemotherapy. Protect from light.     metoprolol 50 MG tablet  Commonly known as:  LOPRESSOR  Take 1 tablet (50 mg total) by mouth 2 (two) times daily.     predniSONE 2.5 MG tablet  Commonly known as:  DELTASONE  Take 2.5 mg by mouth daily with breakfast.     saxagliptin HCl 2.5 MG Tabs tablet  Commonly known as:  ONGLYZA  Take 2.5 mg by mouth daily.      simvastatin 80 MG tablet  Commonly known as:  ZOCOR  Take 80 mg by mouth daily.     Vitamin D (Ergocalciferol) 50000 UNITS Caps capsule  Commonly known as:  DRISDOL  Take 50,000 Units by mouth every 7 (seven) days. Tuesday.        Please HPI for pertinent positives, otherwise complete 10 system ROS negative.  Physical Exam: BP 150/50  Pulse 92  Temp(Src) 99.4 F (37.4 C) (Oral)  Resp 20  Ht _0  (1.651 m)  Wt 210 lb 13.6 oz (95.64 kg)  BMI 35.09 kg/m2  SpO2 99% Body mass index is 35.09 kg/(m^2).  General Appearance:  Alert, cooperative, no distress, slow to respond but oriented  Head:  Normocephalic, without obvious abnormality, atraumatic  Neck: Supple, symmetrical, trachea midline  Lungs:   Clear to auscultation bilaterally, no w/r/r, respirations unlabored without use of accessory muscles.  Chest Wall:  No tenderness or deformity  Heart:  Regular rate and rhythm, S1, S2 normal, (+) LSB murmur, no rub or gallop.  Abdomen:   Soft, non-tender, non distended.  Extremities: Extremities normal, atraumatic, no cyanosis or edema  Neurologic: Slow to respond but oriented.    Results for orders placed during the hospital encounter of 03/13/14 (from the past 48 hour(s))  GLUCOSE, CAPILLARY     Status: Abnormal   Collection Time    03/16/14 12:22 PM      Result Value Ref Range   Glucose-Capillary 174 (*) 70 - 99 mg/dL  GLUCOSE, CAPILLARY     Status: Abnormal   Collection Time    03/16/14  4:47 PM      Result Value Ref Range   Glucose-Capillary 167 (*) 70 - 99 mg/dL   Comment 1 Notify RN     Comment 2 Documented in Chart    GLUCOSE, CAPILLARY     Status: Abnormal   Collection Time    03/16/14  8:43 PM      Result Value Ref Range   Glucose-Capillary 113 (*) 70 - 99 mg/dL   Comment 1 Notify RN    BASIC METABOLIC PANEL     Status: Abnormal   Collection Time    03/17/14  5:41 AM      Result Value Ref Range   Sodium 134 (*) 137 - 147 mEq/L   Potassium 4.4  3.7 - 5.3  mEq/L   Chloride 98  96 - 112 mEq/L   CO2 21  19 - 32 mEq/L   Glucose, Bld 121 (*) 70 - 99 mg/dL   BUN 48 (*) 6 - 23 mg/dL  Creatinine, Ser 3.08 (*) 0.50 - 1.10 mg/dL   Calcium 7.0 (*) 8.4 - 10.5 mg/dL   GFR calc non Af Amer 15 (*) >90 mL/min   GFR calc Af Amer 17 (*) >90 mL/min   Comment: (NOTE)     The eGFR has been calculated using the CKD EPI equation.     This calculation has not been validated in all clinical situations.     eGFR's persistently <90 mL/min signify possible Chronic Kidney     Disease.  CBC     Status: Abnormal   Collection Time    03/17/14  5:41 AM      Result Value Ref Range   WBC 7.6  4.0 - 10.5 K/uL   RBC 2.88 (*) 3.87 - 5.11 MIL/uL   Hemoglobin 8.0 (*) 12.0 - 15.0 g/dL   HCT 24.3 (*) 36.0 - 46.0 %   MCV 84.4  78.0 - 100.0 fL   MCH 27.8  26.0 - 34.0 pg   MCHC 32.9  30.0 - 36.0 g/dL   RDW 17.9 (*) 11.5 - 15.5 %   Platelets 189  150 - 400 K/uL  IRON AND TIBC     Status: Abnormal   Collection Time    03/17/14  5:41 AM      Result Value Ref Range   Iron 21 (*) 42 - 135 ug/dL   TIBC 146 (*) 250 - 470 ug/dL   Saturation Ratios 14 (*) 20 - 55 %   UIBC 125  125 - 400 ug/dL   Comment: Performed at [place]     Status: Abnormal   Collection Time    03/17/14  5:41 AM      Result Value Ref Range   Ferritin 858 (*) 10 - 291 ng/mL   Comment: Performed at [facility]     Status: Abnormal   Collection Time    03/17/14  5:41 AM      Result Value Ref Range   Phosphorus 2.2 (*) 2.3 - 4.6 mg/dL  HEPATITIS B SURFACE ANTIGEN     Status: None   Collection Time    03/17/14  5:41 AM      Result Value Ref Range   Hepatitis B Surface Ag NEGATIVE  NEGATIVE   Comment: Performed at [place]     Status: None   Collection Time    03/17/14  5:41 AM      Result Value Ref Range   HCV Ab NEGATIVE  NEGATIVE   Comment: Performed at [place], CAPILLARY     Status: Abnormal    Collection Time    03/17/14  7:40 AM      Result Value Ref Range   Glucose-Capillary 123 (*) 70 - 99 mg/dL   Comment 1 Notify RN    CULTURE, BLOOD (ROUTINE X 2)     Status: None   Collection Time    03/17/14  9:11 AM      Result Value Ref Range   Specimen Description BLOOD RIGHT HAND     Special Requests BOTTLES DRAWN AEROBIC ONLY 10CC     Culture NO GROWTH 1 DAY     Report Status PENDING    CULTURE, BLOOD (ROUTINE X 2)     Status: None   Collection Time    03/17/14  9:17 AM      Result Value Ref Range   Specimen Description BLOOD LEFT HAND  Special Requests BOTTLES DRAWN AEROBIC ONLY 8CC     Culture NO GROWTH 1 DAY     Report Status PENDING    URINALYSIS, ROUTINE W REFLEX MICROSCOPIC     Status: Abnormal   Collection Time    03/17/14  9:26 AM      Result Value Ref Range   Color, Urine YELLOW  YELLOW   APPearance HAZY (*) CLEAR   Specific Gravity, Urine >1.030 (*) 1.005 - 1.030   pH 5.5  5.0 - 8.0   Glucose, UA NEGATIVE  NEGATIVE mg/dL   Hgb urine dipstick MODERATE (*) NEGATIVE   Bilirubin Urine NEGATIVE  NEGATIVE   Ketones, ur NEGATIVE  NEGATIVE mg/dL   Protein, ur >300 (*) NEGATIVE mg/dL   Urobilinogen, UA 1.0  0.0 - 1.0 mg/dL   Nitrite NEGATIVE  NEGATIVE   Comment: YEAST   Leukocytes, UA NEGATIVE  NEGATIVE  URINE MICROSCOPIC-ADD ON     Status: Abnormal   Collection Time    03/17/14  9:26 AM      Result Value Ref Range   Squamous Epithelial / LPF MANY (*) RARE   WBC, UA 7-10  <3 WBC/hpf   RBC / HPF 3-6  <3 RBC/hpf   Bacteria, UA MANY (*) RARE  GLUCOSE, CAPILLARY     Status: Abnormal   Collection Time    03/17/14 11:48 AM      Result Value Ref Range   Glucose-Capillary 132 (*) 70 - 99 mg/dL   Comment 1 Notify RN    GLUCOSE, CAPILLARY     Status: None   Collection Time    03/17/14  6:23 PM      Result Value Ref Range   Glucose-Capillary 77  70 - 99 mg/dL  URINALYSIS, ROUTINE W REFLEX MICROSCOPIC     Status: Abnormal   Collection Time    03/17/14  6:30 PM       Result Value Ref Range   Color, Urine AMBER (*) YELLOW   Comment: BIOCHEMICALS MAY BE AFFECTED BY COLOR   APPearance CLOUDY (*) CLEAR   Specific Gravity, Urine 1.023  1.005 - 1.030   pH 5.0  5.0 - 8.0   Glucose, UA NEGATIVE  NEGATIVE mg/dL   Hgb urine dipstick MODERATE (*) NEGATIVE   Bilirubin Urine SMALL (*) NEGATIVE   Ketones, ur NEGATIVE  NEGATIVE mg/dL   Protein, ur >300 (*) NEGATIVE mg/dL   Urobilinogen, UA 1.0  0.0 - 1.0 mg/dL   Nitrite NEGATIVE  NEGATIVE   Leukocytes, UA LARGE (*) NEGATIVE  PROTEIN / CREATININE RATIO, URINE     Status: Abnormal   Collection Time    03/17/14  6:30 PM      Result Value Ref Range   Creatinine, Urine 145.63     Total Protein, Urine 421.2     Comment: NO NORMAL RANGE ESTABLISHED FOR THIS TEST   PROTEIN CREATININE RATIO 2.89 (*) 0.00 - 0.15  URINE MICROSCOPIC-ADD ON     Status: Abnormal   Collection Time    03/17/14  6:30 PM      Result Value Ref Range   Squamous Epithelial / LPF FEW (*) RARE   WBC, UA TOO NUMEROUS TO COUNT  <3 WBC/hpf   RBC / HPF 0-2  <3 RBC/hpf   Bacteria, UA FEW (*) RARE   Urine-Other TRICHOMONAS PRESENT    CBC     Status: Abnormal   Collection Time    03/17/14  7:48 PM      Result  Value Ref Range   WBC 8.3  4.0 - 10.5 K/uL   RBC 2.79 (*) 3.87 - 5.11 MIL/uL   Hemoglobin 7.7 (*) 12.0 - 15.0 g/dL   HCT 23.1 (*) 36.0 - 46.0 %   MCV 82.8  78.0 - 100.0 fL   MCH 27.6  26.0 - 34.0 pg   MCHC 33.3  30.0 - 36.0 g/dL   RDW 17.7 (*) 11.5 - 15.5 %   Platelets 191  150 - 400 K/uL  HAPTOGLOBIN     Status: Abnormal   Collection Time    03/17/14  7:48 PM      Result Value Ref Range   Haptoglobin 243 (*) 45 - 215 mg/dL   Comment: Performed at [place]     Status: Abnormal   Collection Time    03/17/14  7:48 PM      Result Value Ref Range   Sodium 131 (*) 137 - 147 mEq/L   Potassium 4.1  3.7 - 5.3 mEq/L   Chloride 95 (*) 96 - 112 mEq/L   CO2 19  19 - 32 mEq/L   Glucose, Bld  134 (*) 70 - 99 mg/dL   BUN 51 (*) 6 - 23 mg/dL   Creatinine, Ser 3.20 (*) 0.50 - 1.10 mg/dL   Calcium 7.2 (*) 8.4 - 10.5 mg/dL   Total Protein 7.0  6.0 - 8.3 g/dL   Albumin 2.2 (*) 3.5 - 5.2 g/dL   AST 34  0 - 37 U/L   ALT 16  0 - 35 U/L   Alkaline Phosphatase 144 (*) 39 - 117 U/L   Total Bilirubin 1.1  0.3 - 1.2 mg/dL   GFR calc non Af Amer 14 (*) >90 mL/min   GFR calc Af Amer 16 (*) >90 mL/min   Comment: (NOTE)     The eGFR has been calculated using the CKD EPI equation.     This calculation has not been validated in all clinical situations.     eGFR's persistently <90 mL/min signify possible Chronic Kidney     Disease.  GLUCOSE, CAPILLARY     Status: Abnormal   Collection Time    03/17/14  8:58 PM      Result Value Ref Range   Glucose-Capillary 103 (*) 70 - 99 mg/dL  RENAL FUNCTION PANEL     Status: Abnormal   Collection Time    03/18/14  3:48 AM      Result Value Ref Range   Sodium 131 (*) 137 - 147 mEq/L   Potassium 4.0  3.7 - 5.3 mEq/L   Chloride 95 (*) 96 - 112 mEq/L   CO2 16 (*) 19 - 32 mEq/L   Glucose, Bld 126 (*) 70 - 99 mg/dL   BUN 53 (*) 6 - 23 mg/dL   Creatinine, Ser 3.23 (*) 0.50 - 1.10 mg/dL   Calcium 7.4 (*) 8.4 - 10.5 mg/dL   Phosphorus 2.4  2.3 - 4.6 mg/dL   Albumin 2.0 (*) 3.5 - 5.2 g/dL   GFR calc non Af Amer 14 (*) >90 mL/min   GFR calc Af Amer 16 (*) >90 mL/min   Comment: (NOTE)     The eGFR has been calculated using the CKD EPI equation.     This calculation has not been validated in all clinical situations.     eGFR's persistently <90 mL/min signify possible Chronic Kidney     Disease.  CBC     Status: Abnormal  Collection Time    03/18/14  3:48 AM      Result Value Ref Range   WBC 7.5  4.0 - 10.5 K/uL   RBC 2.64 (*) 3.87 - 5.11 MIL/uL   Hemoglobin 7.3 (*) 12.0 - 15.0 g/dL   HCT 21.7 (*) 36.0 - 46.0 %   MCV 82.2  78.0 - 100.0 fL   MCH 27.7  26.0 - 34.0 pg   MCHC 33.6  30.0 - 36.0 g/dL   RDW 17.8 (*) 11.5 - 15.5 %   Platelets 181  150 -  400 K/uL  GLUCOSE, CAPILLARY     Status: Abnormal   Collection Time    03/18/14  5:48 AM      Result Value Ref Range   Glucose-Capillary 133 (*) 70 - 99 mg/dL   Dg Chest 2 View  03/16/2014   CLINICAL DATA:  Fever.  EXAM: CHEST  2 VIEW  COMPARISON:  March 15, 2014.  FINDINGS: Stable cardiomegaly. No pneumothorax or pleural effusion is noted. No acute pulmonary disease is noted. Bony thorax appears intact  IMPRESSION: No acute cardiopulmonary abnormality seen.   Electronically Signed   By: Sabino Dick M.D.   On: 03/16/2014 12:16    Assessment/Plan Acute kidney injury (+) ANA, atypical p-ANCA, proteinuria and hematuria History of SLE on solu-medrol and plaquenil Request for image guided renal biopsy on Monday 03/20/14 with moderate sedation. Will make patient NPO after midnight on Sunday and hold all blood thinners, BP must be < [id number] systolic for procedure. Risks and Benefits discussed with the patient. All of the patient's questions were answered, patient is agreeable to proceed. Consent signed and in chart. Low grade fevers was on empiric levaquin at [facility], CXR 6/17 no signs of active disease, no signs of infection, urine Cx no growth, blood Cx no growth.    Tsosie Billing D PA-C 03/18/2014, 11:14 AM

## 2014-03-19 LAB — RENAL FUNCTION PANEL
ALBUMIN: 2 g/dL — AB (ref 3.5–5.2)
BUN: 62 mg/dL — ABNORMAL HIGH (ref 6–23)
CHLORIDE: 96 meq/L (ref 96–112)
CO2: 16 mEq/L — ABNORMAL LOW (ref 19–32)
CREATININE: 3.2 mg/dL — AB (ref 0.50–1.10)
Calcium: 7.7 mg/dL — ABNORMAL LOW (ref 8.4–10.5)
GFR calc Af Amer: 16 mL/min — ABNORMAL LOW (ref >90)
GFR calc non Af Amer: 14 mL/min — ABNORMAL LOW (ref >90)
Glucose, Bld: 221 mg/dL — ABNORMAL HIGH (ref 70–99)
PHOSPHORUS: 4.7 mg/dL — AB (ref 2.3–4.6)
Potassium: 4.3 mEq/L (ref 3.7–5.3)
Sodium: 132 mEq/L — ABNORMAL LOW (ref 137–147)

## 2014-03-19 LAB — CBC
HCT: 22.4 % — ABNORMAL LOW (ref 36.0–46.0)
Hemoglobin: 7.6 g/dL — ABNORMAL LOW (ref 12.0–15.0)
MCH: 28 pg (ref 26.0–34.0)
MCHC: 33.9 g/dL (ref 30.0–36.0)
MCV: 82.7 fL (ref 78.0–100.0)
PLATELETS: 169 10*3/uL (ref 150–400)
RBC: 2.71 MIL/uL — AB (ref 3.87–5.11)
RDW: 18.1 % — ABNORMAL HIGH (ref 11.5–15.5)
WBC: 6.9 10*3/uL (ref 4.0–10.5)

## 2014-03-19 LAB — GLUCOSE, CAPILLARY
GLUCOSE-CAPILLARY: 270 mg/dL — AB (ref 70–99)
GLUCOSE-CAPILLARY: 382 mg/dL — AB (ref 70–99)
Glucose-Capillary: 191 mg/dL — ABNORMAL HIGH (ref 70–99)
Glucose-Capillary: 364 mg/dL — ABNORMAL HIGH (ref 70–99)

## 2014-03-19 LAB — PREPARE RBC (CROSSMATCH)

## 2014-03-19 LAB — PROTIME-INR
INR: 1.15 (ref 0.00–1.49)
Prothrombin Time: 14.5 seconds (ref 11.6–15.2)

## 2014-03-19 MED ORDER — SODIUM BICARBONATE 650 MG PO TABS
1300.0000 mg | ORAL_TABLET | Freq: Three times a day (TID) | ORAL | Status: DC
  Administered 2014-03-19 – 2014-03-26 (×21): 1300 mg via ORAL
  Filled 2014-03-19 (×23): qty 2

## 2014-03-19 MED ORDER — FUROSEMIDE 10 MG/ML IJ SOLN
80.0000 mg | Freq: Once | INTRAMUSCULAR | Status: AC
  Administered 2014-03-19: 80 mg via INTRAVENOUS
  Filled 2014-03-19: qty 8

## 2014-03-19 NOTE — Progress Notes (Signed)
Background:  68 yo AAF with SLE (she doen't recall how long - thinks when in her 6's -  on chronic pred/plaquenil and methotrexate, followed by Dr. Amil Amen), DM, HTN, diastolic dysfunction and severe anemia, with acute kidney injury on chronic (baseline creatinine 1.2-1.4)  associated with proteinuria, low grade microscopic hematuria, + ANA, + atypical p-ANCA raising question of lupus nephritis vs overlap syndrome. Picture confounded to some degree by ARB, NSAIDS and acute heart failure. Will require diagnostic renal biopsy which is requested for Monday  Impression/Plan  1. Acute kidney injury - h/o SLE; + ANA and + atypical p-ANCA with proteinuria and hematuria. Schedule renal biopsy in ultrasound for Monday. Started pulse solumedrol 500 mg IV QD X 3. Received 1st dose 6/20.  Continue plaquenil. Continue to hold methotrexate for right now.  Avoid further NSAIDS or ARB at this time. Strict intake and output. Stopped IVF's to avoid further CHF issues. All anticoagulants are on hold. Using SCD's/ Use SCD's. Creatinine stable at 3.2 today 2. Anemia - severe. Not sure what workup done to date. Was to have started procrit 40,000 once per week but had received only one dose PTA. Continue ESA as aranesp 200/week and give IV feraheme to treat fe deficiency. Haptoglobin normal. Transfuse a unit today for Hb 7.6 for a little bigger cushion for biopsy 3. S/p acute on chronic diastolic CHF - stopped IVF. Changed lasix to po. Volume stable. (Giving additional 80 IV lasix pre-PRBC's to avoid vol issues) 4. SLE - continue pred/plaquenil. Continue to hold MTX for now.  Would recommend getting Dr. Amil Amen involved for historical aspects of her lupus 5. H/o atrial flutter - SR at present 6. DM2 - sliding scale insulin; pulse steroids may affect DM control adversely 7. HTN - avoid ACE or ARB at this time; on BB. BP needs to be under [id number] systolic for bx. Added amlodipine 10 at hs 6/20 8. Fevers - fevers at [facility]. On empiric  levaquin. CXR no PNA. UA does not look infected (temps to 102 at [facility]; Tmax since transfer here 99.4 and presently afebrile) 9. Hyperparathyroidism - no previous Rx that I can see. Started oral calcitriol 0.25/day 10. Metabolic acidosis - started po bicarb. Increase to 2 tid  Subjective:  Feels better than on admission but can't be specific She is hoping she won't have lupus nephritis "I didn't know lupus would mess with your kidneys" Wearing O2 but denies SOB Aware of upcoming transfusion  Objective Vital signs in last 24 hours: Filed Vitals:   03/18/14 2031 03/18/14 2050 03/19/14 0500 03/19/14 0824  BP: 155/55  154/62   Pulse: 77  87   Temp: 98.2 F (36.8 C)  97.5 F (36.4 C)   TempSrc: Oral  Oral   Resp: 19  18   Height:      Weight:   96.2 kg (212 lb 1.3 oz)   SpO2: 100% 100% 100% 98%   Weight change: 0.56 kg (1 lb 3.8 oz)  Intake/Output Summary (Last 24 hours) at 03/19/14 1016 Last data filed at 03/19/14 0900  Gross per 24 hour  Intake    360 ml  Output      0 ml  Net    360 ml   Physical Exam:  BP 154/62  Pulse 87  Temp(Src) 97.5 F (36.4 C) (Oral)  Resp 18  Ht 5\' 5"  (1.651 m)  Wt 96.2 kg (212 lb 1.3 oz)  BMI 35.29 kg/m2  SpO2 98% Gen: Pleasant BF NAD  Up in  the chair Skin: no rash, cyanosis but has chronic darkening and skin changes LE's  Neck: no JVD  Chest: Lung fields clear  Heart: regular, no rub or gallop S1S2 No S3 2/6 murmur aortic area/LSB unchanged  No diastolic murmur or rub  Abdomen: soft, obese, no focal tenderness  Ext: No pitting edema of the LE's.  Neuro: alert, Ox3, no focal deficit  Recent Labs Lab 03/14/14 0622 03/15/14 0551 03/16/14 0728 03/16/14 0835 03/17/14 0541 03/17/14 1948 03/18/14 0348 03/19/14 0400  NA 135* 134* 131*  --  134* 131* 131* 132*  K 4.0 3.8 3.8  --  4.4 4.1 4.0 4.3  CL 101 99 95*  --  98 95* 95* 96  CO2 22 18* 20  --  21 19 16* 16*  GLUCOSE 198* 222* 225*  --  121* 134* 126* 221*  BUN 30* 38* 42*  --   48* 51* 53* 62*  CREATININE 2.20* 2.38* 2.71*  --  3.08* 3.20* 3.23* 3.20*  CALCIUM 7.5* 7.2* 7.4* 7.0* 7.0* 7.2* 7.4* 7.7*  PHOS  --   --  2.7  --  2.2*  --  2.4 4.7*    Recent Labs Lab 03/14/14 0622 03/17/14 1948 03/18/14 0348 03/19/14 0400  AST 20 34  --   --   ALT 8 16  --   --   ALKPHOS 67 144*  --   --   BILITOT 0.8 1.1  --   --   PROT 6.5 7.0  --   --   ALBUMIN 2.3* 2.2* 2.0* 2.0*    Recent Labs Lab 03/13/14 0851  03/15/14 0551  03/17/14 0541 03/17/14 1948 03/18/14 0348 03/19/14 0400  WBC 5.7  < > 9.0  < > 7.6 8.3 7.5 6.9  NEUTROABS 4.1  --  6.5  --   --   --   --   --   HGB 7.9*  < > 8.6*  < > 8.0* 7.7* 7.3* 7.6*  HCT 23.3*  < > 25.3*  < > 24.3* 23.1* 21.7* 22.4*  MCV 84.1  < > 83.5  < > 84.4 82.8 82.2 82.7  PLT 136*  < > 160  < > 189 191 181 169  < > = values in this interval not displayed.  Recent Labs Lab 03/13/14 0845 03/14/14 0621  TROPONINI <0.30 <0.30    Recent Labs Lab 03/18/14 0548 03/18/14 1126 03/18/14 1652 03/18/14 2124 03/19/14 0522  GLUCAP 133* 112* 163* 125* 191*      Recent Labs Lab 03/17/14 0541  IRON 21*  TIBC 146*  FERRITIN 858*   Results for DKAYLA, MCNINCH (MRN [id number]) as of 03/18/2014 13:58  Ref. Range 03/17/2014 19:48  Haptoglobin Latest Range: 45-215 mg/dL 243 (H)  Results for YUMEKA, VALENTINO (MRN [id number]) as of 03/18/2014 13:58  Ref. Range 03/13/2014 09:10 03/14/2014 18:19 03/17/2014 09:26  Color, Urine Latest Range: YELLOW  YELLOW YELLOW YELLOW  APPearance Latest Range: CLEAR  CLEAR HAZY (A) HAZY (A)  Specific Gravity, Urine Latest Range: 1.005-1.030  >1.030 (H) 1.025 >1.030 (H)  pH Latest Range: 5.0-8.0  5.5 5.5 5.5  Glucose Latest Range: NEGATIVE mg/dL NEGATIVE NEGATIVE NEGATIVE  Bilirubin Urine Latest Range: NEGATIVE  SMALL (A) NEGATIVE NEGATIVE  Ketones, ur Latest Range: NEGATIVE mg/dL NEGATIVE NEGATIVE NEGATIVE  Protein Latest Range: NEGATIVE mg/dL 100 (A) 100 (A) >300 (A)  Urobilinogen, UA  Latest Range: 0.0-1.0 mg/dL 0.2 0.2 1.0  Nitrite Latest Range: NEGATIVE  NEGATIVE NEGATIVE NEGATIVE  Leukocytes, UA  Latest Range: NEGATIVE  NEGATIVE NEGATIVE NEGATIVE  Hgb urine dipstick Latest Range: NEGATIVE  TRACE (A) MODERATE (A) MODERATE (A)  WBC, UA Latest Range: <3 WBC/hpf  0-2 7-10  RBC / HPF Latest Range: <3 RBC/hpf 3-6 0-2 3-6  Squamous Epithelial / LPF Latest Range: RARE   MANY (A) MANY (A)  Bacteria, UA Latest Range: RARE  MANY (A) MANY (A) MANY (A)  Casts Latest Range: NEGATIVE   HYALINE CASTS (A)    Medications:   . albuterol  3 mL Inhalation BID  . amLODipine  10 mg Oral QHS  . antiseptic oral rinse  15 mL Mouth Rinse q12n4p  . aspirin EC  81 mg Oral Daily  . atorvastatin  40 mg Oral q1800  . calcitRIOL  0.25 mcg Oral Daily  . chlorhexidine  15 mL Mouth Rinse BID  . darbepoetin (ARANESP) injection - NON-DIALYSIS  200 mcg Subcutaneous Q Sat-1800  . ferumoxytol  510 mg Intravenous Q3 days  . folic acid  1 mg Oral Daily  . furosemide  80 mg Intravenous Once  . furosemide  80 mg Oral BID  . hydroxychloroquine  200 mg Oral BID  . insulin aspart  0-20 Units Subcutaneous TID WC  . insulin aspart  0-5 Units Subcutaneous QHS  . insulin aspart  2 Units Subcutaneous TID WC  . levofloxacin (LEVAQUIN) IV  500 mg Intravenous Q48H  . methylPREDNISolone (SOLU-MEDROL) injection  500 mg Intravenous Q24H  . metoprolol  50 mg Oral BID  . predniSONE  2.5 mg Oral Q breakfast  . sodium bicarbonate  650 mg Oral TID  . sodium chloride  3 mL Intravenous Q12H  . Vitamin D (Ergocalciferol)  50,000 Units Oral Q7 days      Jamal Maes, MD [employer] [phone] pager 03/19/2014, 10:16 AM

## 2014-03-19 NOTE — Progress Notes (Addendum)
[facility] PROGRESS NOTE  [name] [id number] DOB: August 24, 1946 DOA: 03/13/2014 PCP: [name], MD  Summary:  This is a 68 year old female with history of lupus and chronic kidney disease stage III (baseline creatinine 1.2) and chronic anemia who presents to the emergency room with generalized weakness. She was found to have a hemoglobin of 7.3 and creatinine of 2. It was felt that her weakness may be related to her anemia she was transfused 1 unit of PRBCs and started on IV fluids. The patient subsequently developed volume overload, shortness of breath and required transfer to the step down unit for BiPAP therapy. She was seen by cardiology and started on intravenous Lasix. Her respiratory status subsequently improved and she's been weaned back down to 2 L of oxygen. She is no longer short of breath. Unfortunately, her creatinine has continued to rise. Currently it is 3 and her urine output is now unimpressive. She's been having intermittent fevers as high as 103. Renal workup showed positive ANA, double-stranded DNA as well as positive p-ANCA. It was felt the patient would likely need a renal biopsy to further identify the cause of her renal failure. If she does in fact have an anchor nephropathy, she would need further treatments including steroids, cyclophosphamide and plasmapheresis. Recommendations were to transfer the patient to [facility] for further workup. Regarding her fevers, she was started on empiric antibiotics but does not have any clear source of infection. She does not appear septic and her fevers may relate to her underlying connective tissue disease/vasculitis.  Assessment/Plan: Acute respiratory failure with hypoxia  Secondary to acute on chronic diastolic heart failure in setting of worsening anemia. Patient given IV fluids for renal failure and transfusions for anemia on admission. Developed worsening sob with hypoxia. Transferred to ICU on BiPap. She  was started on Lasix and initially had diuresis with improvement in her respiratory status.  Echo with grade 4 diastolic dysfunction, BNP elevated, chest xray consistent with CHF. Cardiology was following the patient and felt that her respiratory failure may have been due to rapid changes in volume status. She has now been weaned down to 2 L of oxygen.   Renal failure (ARF), acute on chronic:  -per renal service -h/o SLE; + ANA and + atypical p-ANCA with proteinuria and hematuria. Schedule renal biopsy in ultrasound for Monday.  -Continue steroids pulse therapy -Avoid NSAIDs/ARB -History intake and output -Follow daily renal function -No further IV fluids at this time -Creatinine has remained stable at 3.2  Fever  -Patient has remained afebrile -continue empirically therapy with levaquin -fever may be related to her underlying connective tissue disorder/vasculitis.   Hyponatremia:  -Likely related to #2.  -continue to improve slow   -will monitor  Anemia in chronic renal disease and Lupus:  -Somewhat symptomatic on presentation with hemoglobin of 7.3. S/p 1 unit PRBC's since admission.  -will continue now aranesp and IV iron -follow Hgb trend and transfuse 1 unit of PRBCs today for better cushion in anticipation of kidney biopsy -no source of bleeding appreciated  Essential hypertension, benign:  -Appears controlled. home medications include losartan and HCTZ which are on hold for now (due to worsening kidney function) -continue her beta blocker and monitor closely.  -amlodipine added today for better control of BP  COPD (chronic obstructive pulmonary disease):  Appears stable at baseline. Will continue her home medications and nebs  -no wheezing  Atrial flutter:  -sinus rhythm on telemetry and EKG -no anticoagulation currently as renal biopsy  anticipated on Monday (6/22) -rate controlled -will follow cardiology rec's  -Continue beta blocker  Hyperlipidemia -Continue  statins  Morbid obesity:  -BMI 34.3.  -encourage to follow low calorie diet and to increase exercise  Acute on hronic diastolic heart failure  -per cardiology -stable and compensated currently -continue PO diuretics a current dose  Type 2 diabetes mellitus with diabetic neuropathy:  -Hemoglobin A1c 6.9. Will hold her oral agents for now. Reasonable control with sliding scale insulin and low-dose meal coverage. -Will follow and adjust coverage especially while receiving steroids   SLE (systemic lupus erythematosus):  Stable.  -plan is to continue prednisone and plaquenil eventually -currently receiving pulse steroid therapy as recommended by renal service X 3 days -Will contact Dr. Amil Amen on 6/22 to receive further information about her lupus workup and treatment.  Code Status: full Family Communication: Discussed with husband at the bedside Disposition Plan:    Consultants:  Cardiology  nephrology  Procedures: echo 03/14/14 Normal LV function EF 50-55% with mild to mod LVH,Grade 4 diastolic dysfunction, mod AI, mild MR, mod-severe TR, PA peak pressure 61 mm Hg.   Kidney biopsy (anticipated for 6/22)   Antibiotics: Vancomycin 03/14/14>> 6/18 Zosyn 03/14/14>> 6/18 Levaquin 6/18>>  HPI/Subjective: Afebrile, denies chest pain, no significant changes in her breathing.  Objective: Filed Vitals:   03/19/14 1321  BP: 166/76  Pulse: 77  Temp: 97.6 F (36.4 C)  Resp: 18    Intake/Output Summary (Last 24 hours) at 03/19/14 1401 Last data filed at 03/19/14 1157  Gross per 24 hour  Intake    610 ml  Output      0 ml  Net    610 ml   Filed Weights   03/17/14 0432 03/18/14 0544 03/19/14 0500  Weight: 95.936 kg (211 lb 8 oz) 95.64 kg (210 lb 13.6 oz) 96.2 kg (212 lb 1.3 oz)    Exam:   General:  Afebrile, NAD, AAOX3; denies CP and SOB  Cardiovascular: Regular rate, +murmur, no rubs or gallops; trace edema bilaterally  Respiratory: normal effort with  conversation. Decrease BS at bases, no frank crackles on exam. No wheeze  Abdomen: obese soft +BS non-tender to palpation  Musculoskeletal:  Joints without swelling/erythema  Data Reviewed: Basic Metabolic Panel:  Recent Labs Lab 03/15/14 0551 03/16/14 0728 03/16/14 0835 03/17/14 0541 03/17/14 1948 03/18/14 0348 03/19/14 0400  NA 134* 131*  --  134* 131* 131* 132*  K 3.8 3.8  --  4.4 4.1 4.0 4.3  CL 99 95*  --  98 95* 95* 96  CO2 18* 20  --  21 19 16* 16*  GLUCOSE 222* 225*  --  121* 134* 126* 221*  BUN 38* 42*  --  48* 51* 53* 62*  CREATININE 2.38* 2.71*  --  3.08* 3.20* 3.23* 3.20*  CALCIUM 7.2* 7.4* 7.0* 7.0* 7.2* 7.4* 7.7*  PHOS  --  2.7  --  2.2*  --  2.4 4.7*   Liver Function Tests:  Recent Labs Lab 03/14/14 0622 03/17/14 1948 03/18/14 0348 03/19/14 0400  AST 20 34  --   --   ALT 8 16  --   --   ALKPHOS 67 144*  --   --   BILITOT 0.8 1.1  --   --   PROT 6.5 7.0  --   --   ALBUMIN 2.3* 2.2* 2.0* 2.0*   CBC:  Recent Labs Lab 03/13/14 0851  03/15/14 0551 03/16/14 0728 03/17/14 0541 03/17/14 1948 03/18/14 0348 03/19/14 0400  WBC 5.7  < > 9.0 8.6 7.6 8.3 7.5 6.9  NEUTROABS 4.1  --  6.5  --   --   --   --   --   HGB 7.9*  < > 8.6* 8.5* 8.0* 7.7* 7.3* 7.6*  HCT 23.3*  < > 25.3* 24.4* 24.3* 23.1* 21.7* 22.4*  MCV 84.1  < > 83.5 83.6 84.4 82.8 82.2 82.7  PLT 136*  < > 160 184 189 191 181 169  < > = values in this interval not displayed. Cardiac Enzymes:  Recent Labs Lab 03/13/14 0845 03/14/14 0621  TROPONINI <0.30 <0.30   BNP (last 3 results)  Recent Labs  03/15/14 0551  PROBNP 33778.0*   CBG:  Recent Labs Lab 03/18/14 1126 03/18/14 1652 03/18/14 2124 03/19/14 0522 03/19/14 1131  GLUCAP 112* 163* 125* 191* 270*    Recent Results (from the past 240 hour(s))  URINE CULTURE     Status: None   Collection Time    03/13/14  9:10 AM      Result Value Ref Range Status   Specimen Description URINE, CATHETERIZED   Final   Special  Requests NONE   Final   Culture  Setup Time     Final   Value: 03/13/2014 23:47     Performed at [facility]     Final   Value: NO GROWTH     Performed at [facility]   Culture     Final   Value: NO GROWTH     Performed at [facility]   Report Status 03/14/2014 FINAL   Final  MRSA PCR SCREENING     Status: None   Collection Time    03/14/14  6:02 PM      Result Value Ref Range Status   MRSA by PCR NEGATIVE  NEGATIVE Final   Comment:            The GeneXpert MRSA Assay (FDA     approved for NASAL specimens     only), is one component of a     comprehensive MRSA colonization     surveillance program. It is not     intended to diagnose MRSA     infection nor to guide or     monitor treatment for     MRSA infections.  URINE CULTURE     Status: None   Collection Time    03/14/14  6:34 PM      Result Value Ref Range Status   Specimen Description URINE, CATHETERIZED   Final   Special Requests IMMUNE:COMPROMISED   Final   Culture  Setup Time     Final   Value: 03/15/2014 14:46     Performed at [facility]     Final   Value: NO GROWTH     Performed at [facility]   Culture     Final   Value: NO GROWTH     Performed at [facility]   Report Status 03/16/2014 FINAL   Final  CULTURE, BLOOD (ROUTINE X 2)     Status: None   Collection Time    03/14/14  6:59 PM      Result Value Ref Range Status   Specimen Description BLOOD RIGHT ARM POSSIBLE TRANSFUSION REACTION   Final   Special Requests     Final   Value: BOTTLES DRAWN AEROBIC AND ANAEROBIC AEB=6CC ANA=7CC   Culture NO GROWTH  4 DAYS   Final   Report Status PENDING   Incomplete  CULTURE, BLOOD (ROUTINE X 2)     Status: None   Collection Time    03/14/14  7:26 PM      Result Value Ref Range Status   Specimen Description BLOOD LEFT ARM POSSIBLE TRANSFUSION REACTION   Final   Special Requests BOTTLES DRAWN AEROBIC ONLY 4CC   Final   Culture NO  GROWTH 4 DAYS   Final   Report Status PENDING   Incomplete  CULTURE, BLOOD (ROUTINE X 2)     Status: None   Collection Time    03/17/14  9:11 AM      Result Value Ref Range Status   Specimen Description BLOOD RIGHT HAND   Final   Special Requests BOTTLES DRAWN AEROBIC ONLY 10CC   Final   Culture NO GROWTH 1 DAY   Final   Report Status PENDING   Incomplete  CULTURE, BLOOD (ROUTINE X 2)     Status: None   Collection Time    03/17/14  9:17 AM      Result Value Ref Range Status   Specimen Description BLOOD LEFT HAND   Final   Special Requests BOTTLES DRAWN AEROBIC ONLY 8CC   Final   Culture NO GROWTH 1 DAY   Final   Report Status PENDING   Incomplete     Studies: No results found.  Scheduled Meds: . albuterol  3 mL Inhalation BID  . amLODipine  10 mg Oral QHS  . antiseptic oral rinse  15 mL Mouth Rinse q12n4p  . aspirin EC  81 mg Oral Daily  . atorvastatin  40 mg Oral q1800  . calcitRIOL  0.25 mcg Oral Daily  . chlorhexidine  15 mL Mouth Rinse BID  . darbepoetin (ARANESP) injection - NON-DIALYSIS  200 mcg Subcutaneous Q Sat-1800  . ferumoxytol  510 mg Intravenous Q3 days  . folic acid  1 mg Oral Daily  . furosemide  80 mg Oral BID  . hydroxychloroquine  200 mg Oral BID  . insulin aspart  0-20 Units Subcutaneous TID WC  . insulin aspart  0-5 Units Subcutaneous QHS  . insulin aspart  2 Units Subcutaneous TID WC  . levofloxacin (LEVAQUIN) IV  500 mg Intravenous Q48H  . methylPREDNISolone (SOLU-MEDROL) injection  500 mg Intravenous Q24H  . metoprolol  50 mg Oral BID  . predniSONE  2.5 mg Oral Q breakfast  . sodium bicarbonate  1,300 mg Oral TID  . sodium chloride  3 mL Intravenous Q12H  . Vitamin D (Ergocalciferol)  50,000 Units Oral Q7 days   Continuous Infusions:    Principal Problem:   Renal failure (ARF), acute on chronic Active Problems:   Atrial flutter   Morbid obesity   Type 2 diabetes mellitus with diabetic neuropathy   SLE (systemic lupus erythematosus)    Essential hypertension, benign   COPD (chronic obstructive pulmonary disease)   Anemia in chronic renal disease   Dehydration   Hyponatremia   Acute on chronic renal failure   Dyspnea   Diastolic CHF, acute on chronic   Acute respiratory failure with hypoxia   Fever, unspecified   Acute on chronic diastolic congestive heart failure    Time spent: >30 minutes  Barton Dubois  [facility] Pager [phone]. If 7PM-7AM, please contact night-coverage at [email or web address], password [facility] 03/19/2014, 2:01 PM  LOS: 6 days

## 2014-03-20 ENCOUNTER — Inpatient Hospital Stay (HOSPITAL_COMMUNITY): Payer: Medicare Other

## 2014-03-20 LAB — CULTURE, BLOOD (ROUTINE X 2)
CULTURE: NO GROWTH
Culture: NO GROWTH

## 2014-03-20 LAB — RENAL FUNCTION PANEL
ALBUMIN: 2.1 g/dL — AB (ref 3.5–5.2)
BUN: 74 mg/dL — ABNORMAL HIGH (ref 6–23)
CO2: 19 mEq/L (ref 19–32)
CREATININE: 2.85 mg/dL — AB (ref 0.50–1.10)
Calcium: 8.2 mg/dL — ABNORMAL LOW (ref 8.4–10.5)
Chloride: 94 mEq/L — ABNORMAL LOW (ref 96–112)
GFR calc Af Amer: 18 mL/min — ABNORMAL LOW (ref >90)
GFR calc non Af Amer: 16 mL/min — ABNORMAL LOW (ref >90)
Glucose, Bld: 350 mg/dL — ABNORMAL HIGH (ref 70–99)
POTASSIUM: 3.5 meq/L — AB (ref 3.7–5.3)
Phosphorus: 4.7 mg/dL — ABNORMAL HIGH (ref 2.3–4.6)
SODIUM: 131 meq/L — AB (ref 137–147)

## 2014-03-20 LAB — CBC
HCT: 25.7 % — ABNORMAL LOW (ref 36.0–46.0)
Hemoglobin: 8.8 g/dL — ABNORMAL LOW (ref 12.0–15.0)
MCH: 27.6 pg (ref 26.0–34.0)
MCHC: 34.2 g/dL (ref 30.0–36.0)
MCV: 80.6 fL (ref 78.0–100.0)
Platelets: 213 10*3/uL (ref 150–400)
RBC: 3.19 MIL/uL — ABNORMAL LOW (ref 3.87–5.11)
RDW: 17.4 % — AB (ref 11.5–15.5)
WBC: 8.4 10*3/uL (ref 4.0–10.5)

## 2014-03-20 LAB — TYPE AND SCREEN
ABO/RH(D): A POS
Antibody Screen: NEGATIVE
Unit division: 0

## 2014-03-20 LAB — GLUCOSE, CAPILLARY
GLUCOSE-CAPILLARY: 226 mg/dL — AB (ref 70–99)
GLUCOSE-CAPILLARY: 343 mg/dL — AB (ref 70–99)
Glucose-Capillary: 356 mg/dL — ABNORMAL HIGH (ref 70–99)
Glucose-Capillary: 284 mg/dL — ABNORMAL HIGH (ref 70–99)

## 2014-03-20 MED ORDER — FENTANYL CITRATE 0.05 MG/ML IJ SOLN
INTRAMUSCULAR | Status: AC
  Filled 2014-03-20: qty 2

## 2014-03-20 MED ORDER — FENTANYL CITRATE 0.05 MG/ML IJ SOLN
INTRAMUSCULAR | Status: AC | PRN
  Administered 2014-03-20: 50 ug via INTRAVENOUS

## 2014-03-20 MED ORDER — GUAIFENESIN-DM 100-10 MG/5ML PO SYRP
5.0000 mL | ORAL_SOLUTION | ORAL | Status: DC | PRN
  Administered 2014-03-20: 5 mL via ORAL
  Filled 2014-03-20: qty 5

## 2014-03-20 MED ORDER — MIDAZOLAM HCL 2 MG/2ML IJ SOLN
INTRAMUSCULAR | Status: AC | PRN
  Administered 2014-03-20: 1 mg via INTRAVENOUS

## 2014-03-20 MED ORDER — MIDAZOLAM HCL 2 MG/2ML IJ SOLN
INTRAMUSCULAR | Status: AC
Start: 2014-03-20 — End: 2014-03-21
  Filled 2014-03-20: qty 2

## 2014-03-20 MED ORDER — HYDROCODONE-ACETAMINOPHEN 5-325 MG PO TABS
1.0000 | ORAL_TABLET | ORAL | Status: DC | PRN
  Administered 2014-03-20 – 2014-03-21 (×2): 1 via ORAL
  Filled 2014-03-20 (×2): qty 1

## 2014-03-20 NOTE — Progress Notes (Signed)
Inpatient Diabetes Program Recommendations  AACE/ADA: New Consensus Statement on Inpatient Glycemic Control (2013)  Target Ranges:  Prepandial:   less than 140 mg/dL      Peak postprandial:   less than 180 mg/dL (1-2 hours)      Critically ill patients:  140 - 180 mg/dL  Results for CLORICE, DEBRULER (MRN [id number]) as of 03/20/2014 11:58  Ref. Range 03/19/2014 05:22 03/19/2014 11:31 03/19/2014 16:46 03/19/2014 21:07 03/20/2014 06:18 03/20/2014 11:18  Glucose-Capillary Latest Range: 70-99 mg/dL 191 (H) 270 (H) 382 (H) 364 (H) 356 (H) 284 (H)   Inpatient Diabetes Program Recommendations Insulin - Basal: consider adding long acting or intermediate acting insulin during steroid therapy Thank you  Raoul Pitch BSN, RN,CDE Inpatient Diabetes Coordinator [phone] (team pager)

## 2014-03-20 NOTE — Progress Notes (Signed)
Background:  68 yo AAF with SLE ( on chronic pred/plaquenil and methotrexate, followed by Dr. Amil Amen), DM, HTN, diastolic dysfunction and severe anemia, with acute kidney injury on chronic (baseline creatinine 1.2-1.4)  associated with proteinuria, low grade microscopic hematuria, + ANA, + atypical p-ANCA raising question of lupus nephritis vs overlap syndrome. Picture confounded to some degree by ARB, NSAIDS and acute heart failure. Will require diagnostic renal biopsy which is requested for today  Impression/Plan  1. Acute kidney injury - h/o SLE; + ANA and + atypical p-ANCA with proteinuria and hematuria. Schedule renal biopsy in ultrasound for today for definitive diagnosis.  Started pulse solumedrol 500 mg IV QD X 3, should get third dose today. Continue plaquenil. Continue to hold methotrexate for right now.  Avoid further NSAIDS or ARB at this time. Strict intake and output- not sure is being done. Stopped IVF's to avoid further CHF issues. All anticoagulants are on hold. Using SCD's/ Use SCD's. Creatinine improved today 2. Anemia - severe. Not sure what workup done to date. Was to have started procrit 40,000 once per week but had received only one dose PTA. Continue ESA as aranesp 200/week and give IV feraheme to treat fe deficiency. Haptoglobin normal. Transfused a unit Sunday. 3. S/p acute on chronic diastolic CHF - stopped IVF. Changed lasix to po. Volume stable but is heavy. (Giving additional 80 IV lasix pre-PRBC's to avoid vol issues) 4. SLE - continue pred/plaquenil. Continue to hold MTX for now.  Would recommend getting Dr. Amil Amen involved for historical aspects of her lupus 5. H/o atrial flutter - SR at present 6. DM2 - sliding scale insulin; pulse steroids may affect DM control adversely 7. HTN - avoid ACE or ARB at this time; on BB. . Added amlodipine 10 at hs 6/20 8. Fevers - fevers at [facility]. On empiric levaquin. CXR no PNA. UA does not look infected (temps to 102 at [facility]; Tmax since  transfer here 99.4 and presently afebrile) 9. Hyperparathyroidism - no previous Rx that I can see. Started oral calcitriol 0.25/day 10. Metabolic acidosis - started po bicarb. Increase to 2 tid- is better  Subjective:  Very pleasant- awaiting biopsy Wearing O2 but denies SOB   Objective Vital signs in last 24 hours: Filed Vitals:   03/19/14 2038 03/20/14 0422 03/20/14 0836 03/20/14 1043  BP: 158/56 162/64  165/50  Pulse: 73 76  76  Temp: 97.5 F (36.4 C) 97.3 F (36.3 C)  97.6 F (36.4 C)  TempSrc: Oral Oral  Oral  Resp: 18 18  18   Height:      Weight:  96.6 kg (212 lb 15.4 oz)    SpO2:  100% 98% 100%   Weight change: 0.4 kg (14.1 oz)  Intake/Output Summary (Last 24 hours) at 03/20/14 1124 Last data filed at 03/20/14 0813  Gross per 24 hour  Intake    608 ml  Output    100 ml  Net    508 ml   Physical Exam:  BP 165/50  Pulse 76  Temp(Src) 97.6 F (36.4 C) (Oral)  Resp 18  Ht 5\' 5"  (1.651 m)  Wt 96.6 kg (212 lb 15.4 oz)  BMI 35.44 kg/m2  SpO2 100% Gen: Pleasant BF NAD  Up in the chair Skin: no rash, cyanosis but has chronic darkening and skin changes LE's  Neck: no JVD  Chest: Lung fields clear  Heart: regular, no rub or gallop S1S2 No S3 2/6 murmur aortic area/LSB unchanged  No diastolic murmur or rub  Abdomen: soft, obese, no focal tenderness  Ext: No pitting edema of the LE's.  Neuro: alert, Ox3, no focal deficit  Recent Labs Lab 03/15/14 0551 03/16/14 0728 03/16/14 0835 03/17/14 0541 03/17/14 1948 03/18/14 0348 03/19/14 0400 03/20/14 0244  NA 134* 131*  --  134* 131* 131* 132* 131*  K 3.8 3.8  --  4.4 4.1 4.0 4.3 3.5*  CL 99 95*  --  98 95* 95* 96 94*  CO2 18* 20  --  21 19 16* 16* 19  GLUCOSE 222* 225*  --  121* 134* 126* 221* 350*  BUN 38* 42*  --  48* 51* 53* 62* 74*  CREATININE 2.38* 2.71*  --  3.08* 3.20* 3.23* 3.20* 2.85*  CALCIUM 7.2* 7.4* 7.0* 7.0* 7.2* 7.4* 7.7* 8.2*  PHOS  --  2.7  --  2.2*  --  2.4 4.7* 4.7*    Recent Labs Lab  03/14/14 0622 03/17/14 1948 03/18/14 0348 03/19/14 0400 03/20/14 0244  AST 20 34  --   --   --   ALT 8 16  --   --   --   ALKPHOS 67 144*  --   --   --   BILITOT 0.8 1.1  --   --   --   PROT 6.5 7.0  --   --   --   ALBUMIN 2.3* 2.2* 2.0* 2.0* 2.1*    Recent Labs Lab 03/15/14 0551  03/17/14 1948 03/18/14 0348 03/19/14 0400 03/20/14 0244  WBC 9.0  < > 8.3 7.5 6.9 8.4  NEUTROABS 6.5  --   --   --   --   --   HGB 8.6*  < > 7.7* 7.3* 7.6* 8.8*  HCT 25.3*  < > 23.1* 21.7* 22.4* 25.7*  MCV 83.5  < > 82.8 82.2 82.7 80.6  PLT 160  < > 191 181 169 213  < > = values in this interval not displayed.  Recent Labs Lab 03/14/14 0621  TROPONINI <0.30    Recent Labs Lab 03/19/14 0522 03/19/14 1131 03/19/14 1646 03/19/14 2107 03/20/14 0618  GLUCAP 191* 270* 382* 364* 356*      Recent Labs Lab 03/17/14 0541  IRON 21*  TIBC 146*  FERRITIN 858*   Results for Dana, GOLLIHAR (MRN [id number]) as of 03/18/2014 13:58  Ref. Range 03/17/2014 19:48  Haptoglobin Latest Range: 45-215 mg/dL 243 (H)  Results for Dana, Little (MRN [id number]) as of 03/18/2014 13:58  Ref. Range 03/13/2014 09:10 03/14/2014 18:19 03/17/2014 09:26  Color, Urine Latest Range: YELLOW  YELLOW YELLOW YELLOW  APPearance Latest Range: CLEAR  CLEAR HAZY (A) HAZY (A)  Specific Gravity, Urine Latest Range: 1.005-1.030  >1.030 (H) 1.025 >1.030 (H)  pH Latest Range: 5.0-8.0  5.5 5.5 5.5  Glucose Latest Range: NEGATIVE mg/dL NEGATIVE NEGATIVE NEGATIVE  Bilirubin Urine Latest Range: NEGATIVE  SMALL (A) NEGATIVE NEGATIVE  Ketones, ur Latest Range: NEGATIVE mg/dL NEGATIVE NEGATIVE NEGATIVE  Protein Latest Range: NEGATIVE mg/dL 100 (A) 100 (A) >300 (A)  Urobilinogen, UA Latest Range: 0.0-1.0 mg/dL 0.2 0.2 1.0  Nitrite Latest Range: NEGATIVE  NEGATIVE NEGATIVE NEGATIVE  Leukocytes, UA Latest Range: NEGATIVE  NEGATIVE NEGATIVE NEGATIVE  Hgb urine dipstick Latest Range: NEGATIVE  TRACE (A) MODERATE (A) MODERATE (A)   WBC, UA Latest Range: <3 WBC/hpf  0-2 7-10  RBC / HPF Latest Range: <3 RBC/hpf 3-6 0-2 3-6  Squamous Epithelial / LPF Latest Range: RARE   MANY (A) MANY (A)  Bacteria, UA  Latest Range: RARE  MANY (A) MANY (A) MANY (A)  Casts Latest Range: NEGATIVE   HYALINE CASTS (A)    Medications:   . albuterol  3 mL Inhalation BID  . amLODipine  10 mg Oral QHS  . antiseptic oral rinse  15 mL Mouth Rinse q12n4p  . aspirin EC  81 mg Oral Daily  . atorvastatin  40 mg Oral q1800  . calcitRIOL  0.25 mcg Oral Daily  . chlorhexidine  15 mL Mouth Rinse BID  . darbepoetin (ARANESP) injection - NON-DIALYSIS  200 mcg Subcutaneous Q Sat-1800  . ferumoxytol  510 mg Intravenous Q3 days  . folic acid  1 mg Oral Daily  . furosemide  80 mg Oral BID  . hydroxychloroquine  200 mg Oral BID  . insulin aspart  0-20 Units Subcutaneous TID WC  . insulin aspart  0-5 Units Subcutaneous QHS  . insulin aspart  2 Units Subcutaneous TID WC  . levofloxacin (LEVAQUIN) IV  500 mg Intravenous Q48H  . methylPREDNISolone (SOLU-MEDROL) injection  500 mg Intravenous Q24H  . metoprolol  50 mg Oral BID  . predniSONE  2.5 mg Oral Q breakfast  . sodium bicarbonate  1,300 mg Oral TID  . sodium chloride  3 mL Intravenous Q12H      GOLDSBOROUGH,KELLIE A   03/20/2014, 11:24 AM

## 2014-03-20 NOTE — Progress Notes (Signed)
[facility] PROGRESS NOTE  [name] [id number] DOB: 13-Aug-1946 DOA: 03/13/2014 PCP: [name], MD  Summary:  This is a 68 year old female with history of lupus and chronic kidney disease stage III (baseline creatinine 1.2) and chronic anemia who presents to the emergency room with generalized weakness. She was found to have a hemoglobin of 7.3 and creatinine of 2. It was felt that her weakness may be related to her anemia she was transfused 1 unit of PRBCs and started on IV fluids. The patient subsequently developed volume overload, shortness of breath and required transfer to the step down unit for BiPAP therapy. She was seen by cardiology and started on intravenous Lasix. Her respiratory status subsequently improved and she's been weaned back down to 2 L of oxygen. She is no longer short of breath. Unfortunately, her creatinine has continued to rise. Currently it is 3 and her urine output is now unimpressive. She's been having intermittent fevers as high as 103. Renal workup showed positive ANA, double-stranded DNA as well as positive p-ANCA. It was felt the patient would likely need a renal biopsy to further identify the cause of her renal failure. If she does in fact have an anchor nephropathy, she would need further treatments including steroids, cyclophosphamide and plasmapheresis. Recommendations were to transfer the patient to [facility] for further workup. Regarding her fevers, she was started on empiric antibiotics but does not have any clear source of infection. She does not appear septic and her fevers may relate to her underlying connective tissue disease/vasculitis.  Assessment/Plan: Acute respiratory failure with hypoxia Secondary to acute on chronic diastolic heart failure in setting of worsening anemia. Patient given IV fluids for renal failure and transfusions for anemia on admission. Developed worsening sob with hypoxia. Transferred to ICU on BiPap.  -She  was started on Lasix and initially had diuresis with improvement in her respiratory status.   -Echo with grade 4 diastolic dysfunction, BNP elevated, chest xray consistent with CHF.  -Cardiology was following the patient and felt that her respiratory failure may have been due to rapid changes in volume status.  -She has now been weaned down to 2 L of oxygen PRN. Good air movement and essentially denying SOB.  Renal failure (ARF), acute on chronic:  -per renal service -h/o SLE; + ANA and + atypical p-ANCA with proteinuria and hematuria.  -biopsy performed w/o complications' will follow results.  -Continue steroids pulse therapy -Avoid NSAIDs/ARB -strict intake and output -Follow daily renal function -No further IV fluids at this time as per nephrology rec's -Creatinine improving, today 2.85  Fever  -Patient has remained afebrile -continue empirically therapy with levaquin; will treat for 3 more days -fever may be related to her underlying connective tissue disorder/vasculitis.   Hyponatremia:  -Likely related to #2.  -stable -will monitor  Anemia in chronic renal disease and Lupus:  -Somewhat symptomatic on presentation with hemoglobin of 7.3. S/p 1 unit PRBC's since admission.  -will continue now aranesp and IV iron -follow Hgb trend and transfuse 1 unit of PRBCs today for better cushion in anticipation of kidney biopsy -no source of bleeding appreciated  Essential hypertension, benign:  -Appears controlled. home medications include losartan and HCTZ which are on hold for now (due to worsening kidney function) -continue her beta blocker and monitor closely.  -amlodipine added today for better control of BP  COPD (chronic obstructive pulmonary disease):  Appears stable at baseline. Will continue her home medications and nebs  -no wheezing  Atrial  flutter:  -sinus rhythm on telemetry and EKG -no anticoagulation currently as renal biopsy anticipated on Monday (6/22) -rate  controlled -will follow cardiology rec's  -Continue beta blocker  Hyperlipidemia -Continue statins  Morbid obesity:  -BMI 34.3.  -encourage to follow low calorie diet and to increase exercise  Acute on hronic diastolic heart failure  -per cardiology -stable and compensated currently -continue PO diuretics a current dose -denies SOB  Type 2 diabetes mellitus with diabetic neuropathy:  -Hemoglobin A1c 6.9. Will hold her oral agents for now. Reasonable control with sliding scale insulin and low-dose meal coverage. -Will follow and adjust coverage especially while receiving steroids   SLE (systemic lupus erythematosus):  Stable.  -plan is to continue prednisone and plaquenil eventually -currently receiving pulse steroid therapy as recommended by renal service X 3 days  Code Status: full Family Communication: Discussed with husband at the bedside Disposition Plan:    Consultants:  Cardiology  nephrology  Procedures: echo 03/14/14 Normal LV function EF 50-55% with mild to mod LVH,Grade 4 diastolic dysfunction, mod AI, mild MR, mod-severe TR, PA peak pressure 61 mm Hg.   Kidney biopsy (anticipated for 6/22)   Antibiotics: Vancomycin 03/14/14>> 6/18 Zosyn 03/14/14>> 6/18 Levaquin 6/18>>  HPI/Subjective: Afebrile, denies chest pain, no significant changes in her breathing. Patient tolerated biopsy w/o problems or complications  Objective: Filed Vitals:   03/20/14 1347  BP: 149/63  Pulse:   Temp:   Resp:     Intake/Output Summary (Last 24 hours) at 03/20/14 1419 Last data filed at 03/20/14 1128  Gross per 24 hour  Intake    361 ml  Output    100 ml  Net    261 ml   Filed Weights   03/18/14 0544 03/19/14 0500 03/20/14 0422  Weight: 95.64 kg (210 lb 13.6 oz) 96.2 kg (212 lb 1.3 oz) 96.6 kg (212 lb 15.4 oz)    Exam:   General:  Afebrile, NAD, AAOX3; denies CP and SOB  Cardiovascular: Regular rate, +murmur, no rubs or gallops; trace edema  bilaterally  Respiratory: normal effort with conversation. Decrease BS at bases, no frank crackles on exam. No wheeze  Abdomen: obese soft +BS non-tender to palpation  Musculoskeletal:  Joints without swelling/erythema; trace to 1+ Le edema  Data Reviewed: Basic Metabolic Panel:  Recent Labs Lab 03/16/14 0728  03/17/14 0541 03/17/14 1948 03/18/14 0348 03/19/14 0400 03/20/14 0244  NA 131*  --  134* 131* 131* 132* 131*  K 3.8  --  4.4 4.1 4.0 4.3 3.5*  CL 95*  --  98 95* 95* 96 94*  CO2 20  --  21 19 16* 16* 19  GLUCOSE 225*  --  121* 134* 126* 221* 350*  BUN 42*  --  48* 51* 53* 62* 74*  CREATININE 2.71*  --  3.08* 3.20* 3.23* 3.20* 2.85*  CALCIUM 7.4*  < > 7.0* 7.2* 7.4* 7.7* 8.2*  PHOS 2.7  --  2.2*  --  2.4 4.7* 4.7*  < > = values in this interval not displayed. Liver Function Tests:  Recent Labs Lab 03/14/14 0622 03/17/14 1948 03/18/14 0348 03/19/14 0400 03/20/14 0244  AST 20 34  --   --   --   ALT 8 16  --   --   --   ALKPHOS 67 144*  --   --   --   BILITOT 0.8 1.1  --   --   --   PROT 6.5 7.0  --   --   --  ALBUMIN 2.3* 2.2* 2.0* 2.0* 2.1*   CBC:  Recent Labs Lab 03/15/14 0551  03/17/14 0541 03/17/14 1948 03/18/14 0348 03/19/14 0400 03/20/14 0244  WBC 9.0  < > 7.6 8.3 7.5 6.9 8.4  NEUTROABS 6.5  --   --   --   --   --   --   HGB 8.6*  < > 8.0* 7.7* 7.3* 7.6* 8.8*  HCT 25.3*  < > 24.3* 23.1* 21.7* 22.4* 25.7*  MCV 83.5  < > 84.4 82.8 82.2 82.7 80.6  PLT 160  < > 189 191 181 169 213  < > = values in this interval not displayed. Cardiac Enzymes:  Recent Labs Lab 03/14/14 0621  TROPONINI <0.30   BNP (last 3 results)  Recent Labs  03/15/14 0551  PROBNP 33778.0*   CBG:  Recent Labs Lab 03/19/14 1131 03/19/14 1646 03/19/14 2107 03/20/14 0618 03/20/14 1118  GLUCAP 270* 382* 364* 356* 284*    Recent Results (from the past 240 hour(s))  URINE CULTURE     Status: None   Collection Time    03/13/14  9:10 AM      Result Value Ref  Range Status   Specimen Description URINE, CATHETERIZED   Final   Special Requests NONE   Final   Culture  Setup Time     Final   Value: 03/13/2014 23:47     Performed at [facility]     Final   Value: NO GROWTH     Performed at [facility]   Culture     Final   Value: NO GROWTH     Performed at [facility]   Report Status 03/14/2014 FINAL   Final  MRSA PCR SCREENING     Status: None   Collection Time    03/14/14  6:02 PM      Result Value Ref Range Status   MRSA by PCR NEGATIVE  NEGATIVE Final   Comment:            The GeneXpert MRSA Assay (FDA     approved for NASAL specimens     only), is one component of a     comprehensive MRSA colonization     surveillance program. It is not     intended to diagnose MRSA     infection nor to guide or     monitor treatment for     MRSA infections.  URINE CULTURE     Status: None   Collection Time    03/14/14  6:34 PM      Result Value Ref Range Status   Specimen Description URINE, CATHETERIZED   Final   Special Requests IMMUNE:COMPROMISED   Final   Culture  Setup Time     Final   Value: 03/15/2014 14:46     Performed at [facility]     Final   Value: NO GROWTH     Performed at [facility]   Culture     Final   Value: NO GROWTH     Performed at [facility]   Report Status 03/16/2014 FINAL   Final  CULTURE, BLOOD (ROUTINE X 2)     Status: None   Collection Time    03/14/14  6:59 PM      Result Value Ref Range Status   Specimen Description BLOOD RIGHT ARM POSSIBLE TRANSFUSION REACTION   Final   Special Requests  Final   Value: BOTTLES DRAWN AEROBIC AND ANAEROBIC AEB=6CC ANA=7CC   Culture NO GROWTH 6 DAYS   Final   Report Status 03/20/2014 FINAL   Final  CULTURE, BLOOD (ROUTINE X 2)     Status: None   Collection Time    03/14/14  7:26 PM      Result Value Ref Range Status   Specimen Description BLOOD LEFT ARM POSSIBLE TRANSFUSION REACTION    Final   Special Requests BOTTLES DRAWN AEROBIC ONLY 4CC   Final   Culture NO GROWTH 6 DAYS   Final   Report Status 03/20/2014 FINAL   Final  CULTURE, BLOOD (ROUTINE X 2)     Status: None   Collection Time    03/17/14  9:11 AM      Result Value Ref Range Status   Specimen Description BLOOD RIGHT HAND   Final   Special Requests BOTTLES DRAWN AEROBIC ONLY 10CC   Final   Culture NO GROWTH 3 DAYS   Final   Report Status PENDING   Incomplete  CULTURE, BLOOD (ROUTINE X 2)     Status: None   Collection Time    03/17/14  9:17 AM      Result Value Ref Range Status   Specimen Description BLOOD LEFT HAND   Final   Special Requests BOTTLES DRAWN AEROBIC ONLY 8CC   Final   Culture NO GROWTH 3 DAYS   Final   Report Status PENDING   Incomplete     Studies: No results found.  Scheduled Meds: . albuterol  3 mL Inhalation BID  . amLODipine  10 mg Oral QHS  . antiseptic oral rinse  15 mL Mouth Rinse q12n4p  . aspirin EC  81 mg Oral Daily  . atorvastatin  40 mg Oral q1800  . calcitRIOL  0.25 mcg Oral Daily  . chlorhexidine  15 mL Mouth Rinse BID  . darbepoetin (ARANESP) injection - NON-DIALYSIS  200 mcg Subcutaneous Q Sat-1800  . fentaNYL      . ferumoxytol  510 mg Intravenous Q3 days  . folic acid  1 mg Oral Daily  . furosemide  80 mg Oral BID  . hydroxychloroquine  200 mg Oral BID  . insulin aspart  0-20 Units Subcutaneous TID WC  . insulin aspart  0-5 Units Subcutaneous QHS  . insulin aspart  2 Units Subcutaneous TID WC  . levofloxacin (LEVAQUIN) IV  500 mg Intravenous Q48H  . methylPREDNISolone (SOLU-MEDROL) injection  500 mg Intravenous Q24H  . metoprolol  50 mg Oral BID  . midazolam      . predniSONE  2.5 mg Oral Q breakfast  . sodium bicarbonate  1,300 mg Oral TID  . sodium chloride  3 mL Intravenous Q12H   Continuous Infusions:    Principal Problem:   Renal failure (ARF), acute on chronic Active Problems:   Atrial flutter   Morbid obesity   Type 2 diabetes mellitus with  diabetic neuropathy   SLE (systemic lupus erythematosus)   Essential hypertension, benign   COPD (chronic obstructive pulmonary disease)   Anemia in chronic renal disease   Dehydration   Hyponatremia   Acute on chronic renal failure   Dyspnea   Diastolic CHF, acute on chronic   Acute respiratory failure with hypoxia   Fever, unspecified   Acute on chronic diastolic congestive heart failure    Time spent: < 30 minutes  Barton Dubois  [facility] Pager [phone]. If 7PM-7AM, please contact night-coverage at [email or web address], password [facility]  03/20/2014, 2:19 PM  LOS: 7 days

## 2014-03-20 NOTE — Progress Notes (Signed)
Patient Name: [name] Date of Encounter: 03/20/2014     Principal Problem:   Renal failure (ARF), acute on chronic Active Problems:   Atrial flutter   Morbid obesity   Type 2 diabetes mellitus with diabetic neuropathy   SLE (systemic lupus erythematosus)   Essential hypertension, benign   COPD (chronic obstructive pulmonary disease)   Anemia in chronic renal disease   Dehydration   Hyponatremia   Acute on chronic renal failure   Dyspnea   Diastolic CHF, acute on chronic   Acute respiratory failure with hypoxia   Fever, unspecified   Acute on chronic diastolic congestive heart failure    SUBJECTIVE  Feels mildly SOB. Has chronic two pillow orthopnea. She thinks this is a little worse than usual. No CP. Wants to eat. Waiting for renal US with biopsy.    CURRENT MEDS . albuterol  3 mL Inhalation BID  . amLODipine  10 mg Oral QHS  . antiseptic oral rinse  15 mL Mouth Rinse q12n4p  . aspirin EC  81 mg Oral Daily  . atorvastatin  40 mg Oral q1800  . calcitRIOL  0.25 mcg Oral Daily  . chlorhexidine  15 mL Mouth Rinse BID  . darbepoetin (ARANESP) injection - NON-DIALYSIS  200 mcg Subcutaneous Q Sat-1800  . ferumoxytol  510 mg Intravenous Q3 days  . folic acid  1 mg Oral Daily  . furosemide  80 mg Oral BID  . hydroxychloroquine  200 mg Oral BID  . insulin aspart  0-20 Units Subcutaneous TID WC  . insulin aspart  0-5 Units Subcutaneous QHS  . insulin aspart  2 Units Subcutaneous TID WC  . levofloxacin (LEVAQUIN) IV  500 mg Intravenous Q48H  . methylPREDNISolone (SOLU-MEDROL) injection  500 mg Intravenous Q24H  . metoprolol  50 mg Oral BID  . predniSONE  2.5 mg Oral Q breakfast  . sodium bicarbonate  1,300 mg Oral TID  . sodium chloride  3 mL Intravenous Q12H  . Vitamin D (Ergocalciferol)  50,000 Units Oral Q7 days    OBJECTIVE  Filed Vitals:   03/19/14 1430 03/19/14 2038 03/20/14 0422 03/20/14 0836  BP: 168/55 158/56 162/64   Pulse: 82 73 76   Temp:   97.5 F (36.4 C) 97.3 F (36.3 C)   TempSrc: Oral Oral Oral   Resp: 16 18 18    Height:      Weight:   212 lb 15.4 oz (96.6 kg)   SpO2:   100% 98%    Intake/Output Summary (Last 24 hours) at 03/20/14 0915 Last data filed at 03/20/14 0813  Gross per 24 hour  Intake    608 ml  Output    100 ml  Net    508 ml   Filed Weights   03/18/14 0544 03/19/14 0500 03/20/14 0422  Weight: 210 lb 13.6 oz (95.64 kg) 212 lb 1.3 oz (96.2 kg) 212 lb 15.4 oz (96.6 kg)    PHYSICAL EXAM  General: Pleasant, NAD. Neuro: Alert and oriented X 3. Moves all extremities spontaneously. Psych: Normal affect. HEENT:  Normal  Neck: Supple without bruits or + JVD. Lungs:  Resp regular and unlabored, crackles at bases Heart: RRR no s3, s4, ++ murmur Abdomen: Soft, non-tender, non-distended, BS + x 4.  Extremities: No clubbing, cyanosis 1+ edema. Marland Kitchen DP/PT/Radials 2+ and equal bilaterally.  Accessory Clinical Findings  CBC  Recent Labs  03/19/14 0400 03/20/14 0244  WBC 6.9 8.4  HGB 7.6* 8.8*  HCT 22.4* 25.7*  MCV 82.7 80.6  PLT 169 [id number]   Basic Metabolic Panel  Recent Labs  03/19/14 0400 03/20/14 0244  NA 132* 131*  K 4.3 3.5*  CL 96 94*  CO2 16* 19  GLUCOSE 221* 350*  BUN 62* 74*  CREATININE 3.20* 2.85*  CALCIUM 7.7* 8.2*  PHOS 4.7* 4.7*   Liver Function Tests  Recent Labs  03/17/14 1948  03/19/14 0400 03/20/14 0244  AST 34  --   --   --   ALT 16  --   --   --   ALKPHOS 144*  --   --   --   BILITOT 1.1  --   --   --   PROT 7.0  --   --   --   ALBUMIN 2.2*  < > 2.0* 2.1*  < > = values in this interval not displayed. No results found for this basename: LIPASE, AMYLASE,  in the last 72 hours  TELE  NSR HR 70s  Radiology/Studies  Dg Chest 2 View  03/16/2014   CLINICAL DATA:  Fever.  EXAM: CHEST  2 VIEW  COMPARISON:  March 15, 2014.  FINDINGS: Stable cardiomegaly. No pneumothorax or pleural effusion is noted. No acute pulmonary disease is noted. Bony thorax appears intact   IMPRESSION: No acute cardiopulmonary abnormality seen.   Electronically Signed   By: Sabino Dick M.D.   On: 03/16/2014 12:16   Dg Chest 2 View  03/13/2014   CLINICAL DATA:  Weakness.  End-stage renal disease.  EXAM: CHEST  2 VIEW  COMPARISON:  09/04/2012  FINDINGS: The heart is enlarged but stable. Stable tortuosity and calcification of the thoracic aorta. Mild vascular congestion but no overt pulmonary edema. No pleural effusions or focal infiltrates. The bony thorax is intact.  IMPRESSION: Stable cardiac enlargement.  Mild vascular congestion but no infiltrates or effusions.   Electronically Signed   By: Kalman Jewels M.D.   On: 03/13/2014 09:55   US Renal  03/15/2014   CLINICAL DATA:  Assess for renal size, hypertension.  EXAM: RENAL/URINARY TRACT ULTRASOUND COMPLETE  COMPARISON:  CT abdomen and pelvis January 11, 2004  FINDINGS: Right Kidney:  Length: 11.9 cm. Echogenicity within normal limits. No hydronephrosis visualized. There is a 3.2 x 2.9 x 3 cm anechoic cyst in the upper pole right kidney.  Left Kidney:  Length: 13.2 cm. Echogenicity within normal limits. No mass visualized. There is dilated calyx in the midpole left kidney. There is no hydronephrosis.  Bladder:  Not visualized due to overlying bowel gas and patient's body habitus per ultrasound technologist.  IMPRESSION: Bilateral normal renal size. Focal dilated calyx in the midpole left kidney without hydronephrosis.   Electronically Signed   By: Abelardo Diesel M.D.   On: 03/15/2014 17:34   US Carotid Duplex Bilateral  02/21/2014   CLINICAL DATA:  Carotid bruit, history of hypertension, hyperlipidemia, diabetes  EXAM: BILATERAL CAROTID DUPLEX ULTRASOUND  TECHNIQUE: Pearline Cables scale imaging, color Doppler and duplex ultrasound were performed of bilateral carotid and vertebral arteries in the neck.  COMPARISON:  None.  FINDINGS: Criteria: Quantification of carotid stenosis is based on velocity parameters that correlate the residual internal carotid  diameter with NASCET-based stenosis levels, using the diameter of the distal internal carotid lumen as the denominator for stenosis measurement.  The following velocity measurements were obtained:  RIGHT  ICA:  96/17 cm/sec  CCA:  [id number] cm/sec  SYSTOLIC ICA/CCA RATIO:  [id number]  DIASTOLIC ICA/CCA RATIO:  2.3  ECA:  80  cm/sec  LEFT  ICA:  85/20 cm/sec  CCA:  [id number] cm/sec  SYSTOLIC ICA/CCA RATIO:  0.9  DIASTOLIC ICA/CCA RATIO:  1.4  ECA:  77 cm/sec  RIGHT CAROTID ARTERY: There is a minimal amount of eccentric mixed echogenic plaque within the mid and distal aspects of the right common carotid artery (images 6, 7, 10 and 11). There is a minimal amount of eccentric mixed echogenic plaque within the right carotid bulb and (images 16), not resulting in elevated peak systolic velocities within the interrogated course of the right internal carotid artery to suggest a hemodynamically significant stenosis.  RIGHT VERTEBRAL ARTERY:  Antegrade flow  LEFT CAROTID ARTERY: There is a minimal amount of eccentric mixed echogenic plaque within the mid and distal aspects of the left common carotid artery (images 41, 42, 45 and 46). There is a very minimal amount of eccentric mixed echogenic partially shadowing plaque with left carotid bulb (image 51) extending to involve the origin and proximal aspect of the left internal carotid artery (image 59), not resulting in elevated peak systolic velocities within the interrogated course of the left internal carotid artery to suggest a hemodynamically significant stenosis.  LEFT VERTEBRAL ARTERY:  Antegrade flow  IMPRESSION: Minimal amount of bilateral atherosclerotic plaque, not resulting in a hemodynamically significant stenosis.   Electronically Signed   By: Sandi Mariscal M.D.   On: 02/21/2014 10:39   Dg Chest Port 1 View  03/15/2014   CLINICAL DATA:  Short of breath.  EXAM: PORTABLE CHEST - 1 VIEW  COMPARISON:  03/14/2014  FINDINGS: Mild enlargement of the cardiopericardial silhouette, stable.  Central vascular congestion and hazy perihilar airspace opacity as well as central interstitial thickening is stable consistent with congestive heart failure and pulmonary edema.  No pneumothorax.  IMPRESSION: No change from the prior study. Findings remain consistent with mild congestive heart failure.   Electronically Signed   By: Lajean Manes M.D.   On: 03/15/2014 10:16   Dg Chest Port 1 View  03/14/2014   CLINICAL DATA:  Shortness of breath.  EXAM: PORTABLE CHEST - 1 VIEW  COMPARISON:  03/13/2014  FINDINGS: Chronic cardiopericardial enlargement. There is vascular pedicle widening. There is newly cephalized blood flow and interstitial indistinctness. No asymmetric opacity, definitive effusion, or pneumothorax.  IMPRESSION: CHF.   Electronically Signed   By: Jorje Guild M.D.   On: 03/14/2014 17:55   2Decho 03/14/14:Study Conclusions - Left ventricle: The cavity size was normal. Wall thickness was increased increased in a pattern of mild to moderate LVH. Systolic function was normal. The estimated ejection fraction was in the range of 50% to 55%. Doppler parameters are consistent with an irreversible restrictive pattern, indicative of decreased left ventricular diastolic compliance and/or increased left atrial pressure (grade 4 diastolic dysfunction). - Aortic valve: Mildly calcified annulus. Trileaflet; mildly thickened leaflets. There was moderate regurgitation. Regurgitation pressure half-time: 324 ms. - Aortic root: The aortic root was normal in size. - Mitral valve: Mildly calcified annulus. Moderately thickened leaflets . There was mild regurgitation. - Left atrium: The atrium was moderately dilated. - Pulmonary veins: There is systolic blunting of pulmonary vein flow suggestive of elevated LA pressure. - Right ventricle: The cavity size was mildly to moderately dilated. - Right atrium: The atrium was moderately dilated. - Tricuspid valve: There was moderate-severe regurgitation.  The jet is eccentric and posterior, this may lead to underestimation of severity. The TR vena contracta measures 0.4 cm. There is systolic reversal of hepatic vein flow suggestive of severe  TR. The morphological etiology of the TR is unclear from available images. - Pulmonary arteries: Systolic pressure was moderately increased. PA peak pressure: 61 mm Hg (S). Pulmonary pressure may be underestimated in the setting of significant TR. - Inferior vena cava: The vessel was dilated. The respirophasic diameter changes were blunted (< 50%), consistent with elevated central venous pressure. - Technically adequate study.   ASSESSMENT AND PLAN  68 yo female hx of DM2, HTN, COPD, anemia, CKD stage III, and aflutter who was admitted on 03/13/14 with generalized weakness and SOB. She had multiple metabolic abnormalities including hyponatremia with Na 129, AKI on CKD, and anemia with Hgb of 7.9. She was thought to be dehydrated and received IVF and was also transfused pRBCs. On 03/14/14 patient developed worsening SOB requiring transfer to ICU and being placed on Bipap. CXR showed pulmonary edema. She was given IV lasix with improvement of symptoms and now is weaned off bipap.   Acute on Chronic Diastolic CHF-- initially presented hypovolemic, in setting of IVF and blood transfusions became fairly acutely volume overloaded requiring transfer to ICU with Bipap and diuresis. BNP >33K -- 2D echo 03/14/14 shows normal LV function EF 50-55% with mild to mod LVH, Grade 4 DD, mod AI, mild MR, mod-severe TR, PA peak pressure 61 mm Hg.  -- She is now on PO Lasix 80mg  BID. -- Positive 5 L and up 7 lbs. She is still SOB and has orthopnea  Atrial Flutter: status post TEE cardioversion in December 2013, maintaining NSR  -- Continue Lopressor 50mg  BID.  -- Previously on Xarelto but this was discontinued. Dr. Domenic Polite not certain when this dropped off her list. She is on aspirin. She has had no obvious recurrences, and  is in sinus rhythm now. Patient decided not to resume.   Pulmonary HTN: PASP of 61 mmHg. Likely related to significant left sided diastolic dysfunction. Her PASP may be underestimated by echo due to her significant TR. In the future may consider RHC to further evaluate her pulmonary HTN, her history of lupus does put her at risk for primary arterial pulmonary htn as well, a RHC would help distinguish this, and will be considered at future follow ups (per Dr. Harl Bowie).  -- Plan right heart cath. However, per Dr. Nelly Laurence note this might not be done this admission.  Hypertension: not well controlled. Continue Norvasc 10mg  and metoprolol 50mg  BID  -- Hyzaar D/Cd due to AKI  Lupus - continue Plaquenil and prednisone. Methotrexate held.   Anemia of chronic disease secondary to Lupus and CKD. Severe.  -- Continue ESA as aranesp 200/week and give IV feraheme to treat fe deficiency. Haptoglobin normal.  -- Transfused a unit yesterday for Hb 7.6 for a little bigger cushion for biopsy. Hg 8.8 s/p transfusion  Acute on chronic CKD: (baseline creatinine 1.2-1.4)  -- Nephrology is following and biopsy is pending. Will be done today. -- Avoiding ACE/ARB. Methotrexate held. Pulsing steroids. Creat is stable. Currently 2.85, which is improved from 3.2   Diabetes mellitus, type II   Tyrell Antonio PA-C  Pager [phone]  Attending Note:   The patient was seen and examined.  Agree with assessment and plan as noted above.  Changes made to the above note as needed.  Pt is doing OK from a cardiac standpoint.   Still has leg edema. Able to lie flat in bed this am.   Going for kidney bx today.    Thayer Headings, Brooke Bonito., MD, [facility] 03/20/2014, 10:44 AM

## 2014-03-20 NOTE — Procedures (Signed)
[place] Right Renal core 16g x3 to surg path No complication No blood loss. See complete dictation in [facility].

## 2014-03-21 DIAGNOSIS — I2789 Other specified pulmonary heart diseases: Secondary | ICD-10-CM

## 2014-03-21 DIAGNOSIS — R509 Fever, unspecified: Secondary | ICD-10-CM

## 2014-03-21 LAB — BASIC METABOLIC PANEL
BUN: 83 mg/dL — ABNORMAL HIGH (ref 6–23)
CO2: 22 mEq/L (ref 19–32)
Calcium: 8.5 mg/dL (ref 8.4–10.5)
Chloride: 96 mEq/L (ref 96–112)
Creatinine, Ser: 2.97 mg/dL — ABNORMAL HIGH (ref 0.50–1.10)
GFR calc Af Amer: 18 mL/min — ABNORMAL LOW (ref >90)
GFR calc non Af Amer: 15 mL/min — ABNORMAL LOW (ref >90)
Glucose, Bld: 481 mg/dL — ABNORMAL HIGH (ref 70–99)
Potassium: 3.9 mEq/L (ref 3.7–5.3)
Sodium: 135 mEq/L — ABNORMAL LOW (ref 137–147)

## 2014-03-21 LAB — GLUCOSE, CAPILLARY
GLUCOSE-CAPILLARY: 196 mg/dL — AB (ref 70–99)
Glucose-Capillary: 409 mg/dL — ABNORMAL HIGH (ref 70–99)
Glucose-Capillary: 481 mg/dL — ABNORMAL HIGH (ref 70–99)
Glucose-Capillary: 326 mg/dL — ABNORMAL HIGH (ref 70–99)
Glucose-Capillary: 303 mg/dL — ABNORMAL HIGH (ref 70–99)

## 2014-03-21 MED ORDER — PREDNISONE 20 MG PO TABS
40.0000 mg | ORAL_TABLET | Freq: Every day | ORAL | Status: DC
  Administered 2014-03-22 – 2014-03-23 (×2): 40 mg via ORAL
  Filled 2014-03-21 (×3): qty 2

## 2014-03-21 MED ORDER — INSULIN DETEMIR 100 UNIT/ML ~~LOC~~ SOLN
15.0000 [IU] | Freq: Two times a day (BID) | SUBCUTANEOUS | Status: DC
  Administered 2014-03-21 – 2014-03-22 (×4): 15 [IU] via SUBCUTANEOUS
  Filled 2014-03-21 (×6): qty 0.15

## 2014-03-21 MED ORDER — INSULIN ASPART 100 UNIT/ML ~~LOC~~ SOLN
20.0000 [IU] | Freq: Once | SUBCUTANEOUS | Status: AC
  Administered 2014-03-21: 20 [IU] via SUBCUTANEOUS

## 2014-03-21 MED ORDER — FUROSEMIDE 80 MG PO TABS
160.0000 mg | ORAL_TABLET | Freq: Two times a day (BID) | ORAL | Status: DC
  Administered 2014-03-21 – 2014-03-26 (×10): 160 mg via ORAL
  Filled 2014-03-21 (×13): qty 2

## 2014-03-21 NOTE — Progress Notes (Signed)
Orders given from hospitalist to have BMET drawn. Call MD to give results of BMET and follow orders from doctor there on. Pt continues to be asymptomatic objectively. Will continue to monitor patient to end of shift and notify oncoming nurse.

## 2014-03-21 NOTE — Progress Notes (Signed)
[facility] PROGRESS NOTE  [name] [id number] DOB: [birth year] DOA: 03/13/2014 PCP: [name], MD  Summary:  This is a 68 year old female with history of lupus and chronic kidney disease stage III (baseline creatinine 1.2) and chronic anemia who presents to the emergency room with generalized weakness. She was found to have a hemoglobin of 7.3 and creatinine of 2. It was felt that her weakness may be related to her anemia she was transfused 1 unit of PRBCs and started on IV fluids. The patient subsequently developed volume overload, shortness of breath and required transfer to the step down unit for BiPAP therapy. She was seen by cardiology and started on intravenous Lasix. Her respiratory status subsequently improved and she's been weaned back down to 2 L of oxygen. She is no longer short of breath. Unfortunately, her creatinine has continued to rise. Currently it is 3 and her urine output is now unimpressive. She's been having intermittent fevers as high as 103. Renal workup showed positive ANA, double-stranded DNA as well as positive p-ANCA. It was felt the patient would likely need a renal biopsy to further identify the cause of her renal failure. If she does in fact have an anchor nephropathy, she would need further treatments including steroids, cyclophosphamide and plasmapheresis. Recommendations were to transfer the patient to [facility] for further workup. Regarding her fevers, she was started on empiric antibiotics but does not have any clear source of infection. She does not appear septic and her fevers may relate to her underlying connective tissue disease/vasculitis. Status post biopsy; waiting results and will follow renal recommendations. Concerns for lupus nephritis.  Assessment/Plan: Acute respiratory failure with hypoxia Secondary to acute on chronic diastolic heart failure in setting of worsening anemia. Patient given IV fluids for renal failure and  transfusions for anemia on admission. Developed worsening sob with hypoxia. Transferred to ICU on BiPap.  -She was started on Lasix and initially had diuresis with improvement in her respiratory status.   -Echo with grade 4 diastolic dysfunction, BNP elevated, chest xray consistent with CHF.  -Cardiology was following the patient and felt that her respiratory failure may have been due to rapid changes in volume status.  -She has now been weaned down to 2 L of oxygen PRN. Good air movement and essentially denying SOB.  Renal failure (ARF), acute on chronic:  -per renal service -h/o SLE; + ANA and + atypical p-ANCA with proteinuria and hematuria.  -biopsy performed w/o complications' will follow results.  -Continue steroids pulse therapy -Avoid NSAIDs/ARB -strict intake and output -Follow daily renal function -No further IV fluids at this time as per nephrology rec's -Creatinine improving, today 2.85  Fever  -Patient has remained afebrile -continue empirically therapy with levaquin; will treat for 3 more days -fever may be related to her underlying connective tissue disorder/vasculitis.   Hyponatremia:  -Likely related to #2.  -stable -will monitor  Anemia in chronic renal disease and Lupus:  -Somewhat symptomatic on presentation with hemoglobin of 7.3. S/p 1 unit PRBC's since admission.  -will continue now aranesp and IV iron -follow Hgb trend and transfuse 1 unit of PRBCs today for better cushion in anticipation of kidney biopsy -no source of bleeding appreciated  Essential hypertension, benign:  -Appears controlled. home medications include losartan and HCTZ which are on hold for now (due to worsening kidney function) -continue her beta blocker and monitor closely.  -amlodipine added today for better control of BP  COPD (chronic obstructive pulmonary disease):  Appears stable  at baseline. Will continue her home medications and nebs  -no wheezing  Atrial flutter:  -sinus  rhythm on telemetry and EKG -no anticoagulation currently as renal biopsy anticipated on Monday (6/22) -rate controlled -will follow cardiology rec's  -Continue beta blocker  Hyperlipidemia -Continue statins  Morbid obesity:  -BMI 34.3.  -encourage to follow low calorie diet and to increase exercise  Acute on hronic diastolic heart failure  -per cardiology -stable and compensated currently -continue PO diuretics a current dose -denies SOB  Type 2 diabetes mellitus with diabetic neuropathy:  -Hemoglobin A1c 6.9. Will hold her oral agents for now.  -uncontrolled now due to steroids -anticipate improvement once start tapering -meanwhile will continue SSI resistant, meal coverage and start levemir BID   SLE (systemic lupus erythematosus):  Stable.  -plan is to continue prednisone and plaquenil eventually -currently receiving pulse steroid therapy as recommended by renal service X 3 days (day 3/3) -higher prednisone dose to follow (starting prednisone at 40mg  daily)   Code Status: full Family Communication: Discussed with husband at the bedside Disposition Plan: to be determined    Consultants:  Cardiology  nephrology  Procedures: echo 03/14/14 Normal LV function EF 50-55% with mild to mod LVH,Grade 4 diastolic dysfunction, mod AI, mild MR, mod-severe TR, PA peak pressure 61 mm Hg.   Kidney biopsy (anticipated for 6/22)   Antibiotics: Vancomycin 03/14/14>> 6/18 Zosyn 03/14/14>> 6/18 Levaquin 6/18>>6/25  HPI/Subjective: Afebrile, denies chest pain, no significant changes in her breathing. Patient with elevated BS   Objective: Filed Vitals:   03/21/14 0643  BP: 141/54  Pulse: 81  Temp: 97.9 F (36.6 C)  Resp:     Intake/Output Summary (Last 24 hours) at 03/21/14 1133 Last data filed at 03/21/14 0942  Gross per 24 hour  Intake    534 ml  Output    200 ml  Net    334 ml   Filed Weights   03/19/14 0500 03/20/14 0422 03/21/14 0643  Weight: 96.2 kg (212 lb  1.3 oz) 96.6 kg (212 lb 15.4 oz) 96.253 kg (212 lb 3.2 oz)    Exam:   General:  Afebrile, NAD, AAOX3; denies CP and SOB  Cardiovascular: Regular rate, +murmur, no rubs or gallops; trace edema bilaterally  Respiratory: normal effort with conversation. Decrease BS at bases, no frank crackles on exam. No wheeze  Abdomen: obese soft +BS non-tender to palpation  Musculoskeletal:  Joints without swelling/erythema; trace to 1+ Le edema  Data Reviewed: Basic Metabolic Panel:  Recent Labs Lab 03/16/14 0728  03/17/14 0541 03/17/14 1948 03/18/14 0348 03/19/14 0400 03/20/14 0244 03/21/14 0724  NA 131*  --  134* 131* 131* 132* 131* 135*  K 3.8  --  4.4 4.1 4.0 4.3 3.5* 3.9  CL 95*  --  98 95* 95* 96 94* 96  CO2 20  --  21 19 16* 16* 19 22  GLUCOSE 225*  --  121* 134* 126* 221* 350* 481*  BUN 42*  --  48* 51* 53* 62* 74* 83*  CREATININE 2.71*  --  3.08* 3.20* 3.23* 3.20* 2.85* 2.97*  CALCIUM 7.4*  < > 7.0* 7.2* 7.4* 7.7* 8.2* 8.5  PHOS 2.7  --  2.2*  --  2.4 4.7* 4.7*  --   < > = values in this interval not displayed. Liver Function Tests:  Recent Labs Lab 03/17/14 1948 03/18/14 0348 03/19/14 0400 03/20/14 0244  AST 34  --   --   --   ALT 16  --   --   --  ALKPHOS 144*  --   --   --   BILITOT 1.1  --   --   --   PROT 7.0  --   --   --   ALBUMIN 2.2* 2.0* 2.0* 2.1*   CBC:  Recent Labs Lab 03/15/14 0551  03/17/14 0541 03/17/14 1948 03/18/14 0348 03/19/14 0400 03/20/14 0244  WBC 9.0  < > 7.6 8.3 7.5 6.9 8.4  NEUTROABS 6.5  --   --   --   --   --   --   HGB 8.6*  < > 8.0* 7.7* 7.3* 7.6* 8.8*  HCT 25.3*  < > 24.3* 23.1* 21.7* 22.4* 25.7*  MCV 83.5  < > 84.4 82.8 82.2 82.7 80.6  PLT 160  < > 189 191 181 169 213  < > = values in this interval not displayed.  BNP (last 3 results)  Recent Labs  03/15/14 0551  PROBNP 33778.0*   CBG:  Recent Labs Lab 03/20/14 1118 03/20/14 1552 03/20/14 2139 03/21/14 0637 03/21/14 1103  GLUCAP 284* 226* 343* 409* 481*     Recent Results (from the past 240 hour(s))  URINE CULTURE     Status: None   Collection Time    03/13/14  9:10 AM      Result Value Ref Range Status   Specimen Description URINE, CATHETERIZED   Final   Special Requests NONE   Final   Culture  Setup Time     Final   Value: 03/13/2014 23:47     Performed at [facility]     Final   Value: NO GROWTH     Performed at [facility]   Culture     Final   Value: NO GROWTH     Performed at [facility]   Report Status 03/14/2014 FINAL   Final  MRSA PCR SCREENING     Status: None   Collection Time    03/14/14  6:02 PM      Result Value Ref Range Status   MRSA by PCR NEGATIVE  NEGATIVE Final   Comment:            The GeneXpert MRSA Assay (FDA     approved for NASAL specimens     only), is one component of a     comprehensive MRSA colonization     surveillance program. It is not     intended to diagnose MRSA     infection nor to guide or     monitor treatment for     MRSA infections.  URINE CULTURE     Status: None   Collection Time    03/14/14  6:34 PM      Result Value Ref Range Status   Specimen Description URINE, CATHETERIZED   Final   Special Requests IMMUNE:COMPROMISED   Final   Culture  Setup Time     Final   Value: 03/15/2014 14:46     Performed at [facility]     Final   Value: NO GROWTH     Performed at [facility]   Culture     Final   Value: NO GROWTH     Performed at [facility]   Report Status 03/16/2014 FINAL   Final  CULTURE, BLOOD (ROUTINE X 2)     Status: None   Collection Time    03/14/14  6:59 PM      Result  Value Ref Range Status   Specimen Description BLOOD RIGHT ARM POSSIBLE TRANSFUSION REACTION   Final   Special Requests     Final   Value: BOTTLES DRAWN AEROBIC AND ANAEROBIC AEB=6CC ANA=7CC   Culture NO GROWTH 6 DAYS   Final   Report Status 03/20/2014 FINAL   Final  CULTURE, BLOOD (ROUTINE X 2)     Status: None    Collection Time    03/14/14  7:26 PM      Result Value Ref Range Status   Specimen Description BLOOD LEFT ARM POSSIBLE TRANSFUSION REACTION   Final   Special Requests BOTTLES DRAWN AEROBIC ONLY 4CC   Final   Culture NO GROWTH 6 DAYS   Final   Report Status 03/20/2014 FINAL   Final  CULTURE, BLOOD (ROUTINE X 2)     Status: None   Collection Time    03/17/14  9:11 AM      Result Value Ref Range Status   Specimen Description BLOOD RIGHT HAND   Final   Special Requests BOTTLES DRAWN AEROBIC ONLY 10CC   Final   Culture NO GROWTH 4 DAYS   Final   Report Status PENDING   Incomplete  CULTURE, BLOOD (ROUTINE X 2)     Status: None   Collection Time    03/17/14  9:17 AM      Result Value Ref Range Status   Specimen Description BLOOD LEFT HAND   Final   Special Requests BOTTLES DRAWN AEROBIC ONLY 8CC   Final   Culture NO GROWTH 4 DAYS   Final   Report Status PENDING   Incomplete     Studies: US Biopsy  03/20/2014   CLINICAL DATA:  Acute renal failure, proteinuria, history of lupus.  COMPARISON:  03/15/2014  TECHNIQUE: ULTRASOUND GUIDED RENAL CORE BIOPSY  The procedure, risks (including but not limited to bleeding, infection, organ damage ), benefits, and alternatives were explained to the patient. Questions regarding the procedure were encouraged and answered. The patient understands and consents to the procedure. Survey ultrasound was performed and an appropriate skin entry site was localized. Site was marked, prepped with Betadine, draped in usual sterile fashion, infiltrated locally with 1% lidocaine. Intravenous Fentanyl and Versed were administered as conscious sedation during continuous cardiorespiratory monitoring by the radiology RN with a total moderate sedation time of less than 30 minutes.  Under real time ultrasound guidance, a 15 gauge trocar needle was advanced to the margin of the lower pole of the right kidney. A coaxial Bard core needle was used to obtain 3 16 gauge samples. The  guide needle was removed. The core samples were submitted to pathology. The patient tolerated procedure well, with no immediate complication.  IMPRESSION: 1. Technically successful ultrasound-guided core renal biopsy , lower pole right kidney.   Electronically Signed   By: Arne Cleveland M.D.   On: 03/20/2014 16:18    Scheduled Meds: . albuterol  3 mL Inhalation BID  . amLODipine  10 mg Oral QHS  . antiseptic oral rinse  15 mL Mouth Rinse q12n4p  . aspirin EC  81 mg Oral Daily  . atorvastatin  40 mg Oral q1800  . calcitRIOL  0.25 mcg Oral Daily  . chlorhexidine  15 mL Mouth Rinse BID  . darbepoetin (ARANESP) injection - NON-DIALYSIS  200 mcg Subcutaneous Q Sat-1800  . folic acid  1 mg Oral Daily  . furosemide  160 mg Oral BID  . hydroxychloroquine  200 mg Oral BID  . insulin  aspart  0-20 Units Subcutaneous TID WC  . insulin aspart  0-5 Units Subcutaneous QHS  . insulin aspart  2 Units Subcutaneous TID WC  . insulin aspart  20 Units Subcutaneous Once  . insulin detemir  15 Units Subcutaneous BID  . levofloxacin (LEVAQUIN) IV  500 mg Intravenous Q48H  . metoprolol  50 mg Oral BID  . [START ON 03/22/2014] predniSONE  40 mg Oral Q breakfast  . sodium bicarbonate  1,300 mg Oral TID  . sodium chloride  3 mL Intravenous Q12H   Continuous Infusions:    Principal Problem:   Renal failure (ARF), acute on chronic Active Problems:   Atrial flutter   Morbid obesity   Type 2 diabetes mellitus with diabetic neuropathy   SLE (systemic lupus erythematosus)   Essential hypertension, benign   COPD (chronic obstructive pulmonary disease)   Anemia in chronic renal disease   Dehydration   Hyponatremia   Acute on chronic renal failure   Dyspnea   Diastolic CHF, acute on chronic   Acute respiratory failure with hypoxia   Fever, unspecified   Acute on chronic diastolic congestive heart failure    Time spent: < 30 minutes  Barton Dubois  [facility] Pager [phone]. If 7PM-7AM,  please contact night-coverage at [email or web address], password [facility] 03/21/2014, 11:33 AM  LOS: 8 days

## 2014-03-21 NOTE — Progress Notes (Signed)
Patient's CBG result is 409. Notified hospitalist via text page.Waiting for orders from hospitalist and will continue to monitor patient. Patient asymptomatic at this time.

## 2014-03-21 NOTE — Progress Notes (Signed)
Patient Name: [name] Date of Encounter: 03/21/2014     Principal Problem:   Renal failure (ARF), acute on chronic Active Problems:   Atrial flutter   Morbid obesity   Type 2 diabetes mellitus with diabetic neuropathy   SLE (systemic lupus erythematosus)   Essential hypertension, benign   COPD (chronic obstructive pulmonary disease)   Anemia in chronic renal disease   Dehydration   Hyponatremia   Acute on chronic renal failure   Dyspnea   Diastolic CHF, acute on chronic   Acute respiratory failure with hypoxia   Fever, unspecified   Acute on chronic diastolic congestive heart failure    SUBJECTIVE  She is feeling much better today. Patient less SOB and was able to sleep last night on her usual incline. No PND. She reports a mild cough x1-2 days    CURRENT MEDS . albuterol  3 mL Inhalation BID  . amLODipine  10 mg Oral QHS  . antiseptic oral rinse  15 mL Mouth Rinse q12n4p  . aspirin EC  81 mg Oral Daily  . atorvastatin  40 mg Oral q1800  . calcitRIOL  0.25 mcg Oral Daily  . chlorhexidine  15 mL Mouth Rinse BID  . darbepoetin (ARANESP) injection - NON-DIALYSIS  200 mcg Subcutaneous Q Sat-1800  . folic acid  1 mg Oral Daily  . furosemide  80 mg Oral BID  . hydroxychloroquine  200 mg Oral BID  . insulin aspart  0-20 Units Subcutaneous TID WC  . insulin aspart  0-5 Units Subcutaneous QHS  . insulin aspart  2 Units Subcutaneous TID WC  . levofloxacin (LEVAQUIN) IV  500 mg Intravenous Q48H  . metoprolol  50 mg Oral BID  . predniSONE  2.5 mg Oral Q breakfast  . sodium bicarbonate  1,300 mg Oral TID  . sodium chloride  3 mL Intravenous Q12H    OBJECTIVE  Filed Vitals:   03/20/14 2046 03/20/14 2051 03/20/14 2246 03/21/14 0643  BP: 142/52  155/50 141/54  Pulse: 75 73 76 81  Temp: 97.6 F (36.4 C)   97.9 F (36.6 C)  TempSrc: Oral   Oral  Resp: 20 18    Height:      Weight:    212 lb 3.2 oz (96.253 kg)  SpO2: 99% 99%  97%    Intake/Output  Summary (Last 24 hours) at 03/21/14 0811 Last data filed at 03/20/14 1756  Gross per 24 hour  Intake    397 ml  Output    200 ml  Net    197 ml   Filed Weights   03/19/14 0500 03/20/14 0422 03/21/14 0643  Weight: 212 lb 1.3 oz (96.2 kg) 212 lb 15.4 oz (96.6 kg) 212 lb 3.2 oz (96.253 kg)    PHYSICAL EXAM  General: Pleasant, NAD. Neuro: Alert and oriented X 3. Moves all extremities spontaneously. Psych: Normal affect. HEENT:  Normal  Neck: Supple without bruits + JVD. Lungs:  Resp regular and unlabored, crackles at bases. Mild cough.   Heart: RRR no s3, s4, ++ murmur Abdomen: Soft, non-tender, non-distended, BS + x 4.  Extremities: No clubbing, cyanosis 1+ edema. Marland Kitchen DP/PT/Radials 2+ and equal bilaterally.  Accessory Clinical Findings  CBC  Recent Labs  03/19/14 0400 03/20/14 0244  WBC 6.9 8.4  HGB 7.6* 8.8*  HCT 22.4* 25.7*  MCV 82.7 80.6  PLT 169 [id number]   Basic Metabolic Panel  Recent Labs  03/19/14 0400 03/20/14 0244 03/21/14 0724  NA 132* 131*  135*  K 4.3 3.5* 3.9  CL 96 94* 96  CO2 16* 19 22  GLUCOSE 221* 350* 481*  BUN 62* 74* 83*  CREATININE 3.20* 2.85* 2.97*  CALCIUM 7.7* 8.2* 8.5  PHOS 4.7* 4.7*  --    Liver Function Tests  Recent Labs  03/19/14 0400 03/20/14 0244  ALBUMIN 2.0* 2.1*    TELE  Not on tele  Radiology/Studies  Dg Chest 2 View  03/16/2014   CLINICAL DATA:  Fever.  EXAM: CHEST  2 VIEW  COMPARISON:  March 15, 2014.  FINDINGS: Stable cardiomegaly. No pneumothorax or pleural effusion is noted. No acute pulmonary disease is noted. Bony thorax appears intact  IMPRESSION: No acute cardiopulmonary abnormality seen.   Electronically Signed   By: Sabino Dick M.D.   On: 03/16/2014 12:16   Dg Chest 2 View  03/13/2014   CLINICAL DATA:  Weakness.  End-stage renal disease.  EXAM: CHEST  2 VIEW  COMPARISON:  09/04/2012  FINDINGS: The heart is enlarged but stable. Stable tortuosity and calcification of the thoracic aorta. Mild vascular congestion  but no overt pulmonary edema. No pleural effusions or focal infiltrates. The bony thorax is intact.  IMPRESSION: Stable cardiac enlargement.  Mild vascular congestion but no infiltrates or effusions.   Electronically Signed   By: Kalman Jewels M.D.   On: 03/13/2014 09:55   US Renal  03/15/2014   CLINICAL DATA:  Assess for renal size, hypertension.  EXAM: RENAL/URINARY TRACT ULTRASOUND COMPLETE  COMPARISON:  CT abdomen and pelvis January 11, 2004  FINDINGS: Right Kidney:  Length: 11.9 cm. Echogenicity within normal limits. No hydronephrosis visualized. There is a 3.2 x 2.9 x 3 cm anechoic cyst in the upper pole right kidney.  Left Kidney:  Length: 13.2 cm. Echogenicity within normal limits. No mass visualized. There is dilated calyx in the midpole left kidney. There is no hydronephrosis.  Bladder:  Not visualized due to overlying bowel gas and patient's body habitus per ultrasound technologist.  IMPRESSION: Bilateral normal renal size. Focal dilated calyx in the midpole left kidney without hydronephrosis.   Electronically Signed   By: Abelardo Diesel M.D.   On: 03/15/2014 17:34   US Carotid Duplex Bilateral  02/21/2014   CLINICAL DATA:  Carotid bruit, history of hypertension, hyperlipidemia, diabetes  EXAM: BILATERAL CAROTID DUPLEX ULTRASOUND  TECHNIQUE: Pearline Cables scale imaging, color Doppler and duplex ultrasound were performed of bilateral carotid and vertebral arteries in the neck.  COMPARISON:  None.  FINDINGS: Criteria: Quantification of carotid stenosis is based on velocity parameters that correlate the residual internal carotid diameter with NASCET-based stenosis levels, using the diameter of the distal internal carotid lumen as the denominator for stenosis measurement.  The following velocity measurements were obtained:  RIGHT  ICA:  96/17 cm/sec  CCA:  [id number] cm/sec  SYSTOLIC ICA/CCA RATIO:  [id number]  DIASTOLIC ICA/CCA RATIO:  2.3  ECA:  80 cm/sec  LEFT  ICA:  85/20 cm/sec  CCA:  [id number] cm/sec  SYSTOLIC ICA/CCA  RATIO:  0.9  DIASTOLIC ICA/CCA RATIO:  1.4  ECA:  77 cm/sec  RIGHT CAROTID ARTERY: There is a minimal amount of eccentric mixed echogenic plaque within the mid and distal aspects of the right common carotid artery (images 6, 7, 10 and 11). There is a minimal amount of eccentric mixed echogenic plaque within the right carotid bulb and (images 16), not resulting in elevated peak systolic velocities within the interrogated course of the right internal carotid artery to suggest a hemodynamically significant  stenosis.  RIGHT VERTEBRAL ARTERY:  Antegrade flow  LEFT CAROTID ARTERY: There is a minimal amount of eccentric mixed echogenic plaque within the mid and distal aspects of the left common carotid artery (images 41, 42, 45 and 46). There is a very minimal amount of eccentric mixed echogenic partially shadowing plaque with left carotid bulb (image 51) extending to involve the origin and proximal aspect of the left internal carotid artery (image 59), not resulting in elevated peak systolic velocities within the interrogated course of the left internal carotid artery to suggest a hemodynamically significant stenosis.  LEFT VERTEBRAL ARTERY:  Antegrade flow  IMPRESSION: Minimal amount of bilateral atherosclerotic plaque, not resulting in a hemodynamically significant stenosis.   Electronically Signed   By: Sandi Mariscal M.D.   On: 02/21/2014 10:39   Dg Chest Port 1 View  03/15/2014   CLINICAL DATA:  Short of breath.  EXAM: PORTABLE CHEST - 1 VIEW  COMPARISON:  03/14/2014  FINDINGS: Mild enlargement of the cardiopericardial silhouette, stable. Central vascular congestion and hazy perihilar airspace opacity as well as central interstitial thickening is stable consistent with congestive heart failure and pulmonary edema.  No pneumothorax.  IMPRESSION: No change from the prior study. Findings remain consistent with mild congestive heart failure.   Electronically Signed   By: Lajean Manes M.D.   On: 03/15/2014 10:16   Dg  Chest Port 1 View  03/14/2014   CLINICAL DATA:  Shortness of breath.  EXAM: PORTABLE CHEST - 1 VIEW  COMPARISON:  03/13/2014  FINDINGS: Chronic cardiopericardial enlargement. There is vascular pedicle widening. There is newly cephalized blood flow and interstitial indistinctness. No asymmetric opacity, definitive effusion, or pneumothorax.  IMPRESSION: CHF.   Electronically Signed   By: Jorje Guild M.D.   On: 03/14/2014 17:55   2Decho 03/14/14:Study Conclusions - Left ventricle: The cavity size was normal. Wall thickness was increased increased in a pattern of mild to moderate LVH. Systolic function was normal. The estimated ejection fraction was in the range of 50% to 55%. Doppler parameters are consistent with an irreversible restrictive pattern, indicative of decreased left ventricular diastolic compliance and/or increased left atrial pressure (grade 4 diastolic dysfunction). - Aortic valve: Mildly calcified annulus. Trileaflet; mildly thickened leaflets. There was moderate regurgitation. Regurgitation pressure half-time: 324 ms. - Aortic root: The aortic root was normal in size. - Mitral valve: Mildly calcified annulus. Moderately thickened leaflets . There was mild regurgitation. - Left atrium: The atrium was moderately dilated. - Pulmonary veins: There is systolic blunting of pulmonary vein flow suggestive of elevated LA pressure. - Right ventricle: The cavity size was mildly to moderately dilated. - Right atrium: The atrium was moderately dilated. - Tricuspid valve: There was moderate-severe regurgitation. The jet is eccentric and posterior, this may lead to underestimation of severity. The TR vena contracta measures 0.4 cm. There is systolic reversal of hepatic vein flow suggestive of severe TR. The morphological etiology of the TR is unclear from available images. - Pulmonary arteries: Systolic pressure was moderately increased. PA peak pressure: 61 mm Hg (S). Pulmonary  pressure may be underestimated in the setting of significant TR. - Inferior vena cava: The vessel was dilated. The respirophasic diameter changes were blunted (< 50%), consistent with elevated central venous pressure. - Technically adequate study.   ASSESSMENT AND PLAN  68 yo female hx of DM2, HTN, COPD, anemia, CKD stage III, and aflutter who was admitted on 03/13/14 with generalized weakness and SOB. She had multiple metabolic abnormalities including hyponatremia with  Na 129, AKI on CKD, and anemia with Hgb of 7.9. She was thought to be dehydrated and received IVF and was also transfused pRBCs. On 03/14/14 patient developed worsening SOB requiring transfer to ICU and being placed on Bipap. CXR showed pulmonary edema. She was given IV lasix with improvement of symptoms and now is weaned off bipap.   Acute on Chronic Diastolic CHF-- initially presented hypovolemic, in setting of IVF and blood transfusions became fairly acutely volume overloaded requiring transfer to ICU with Bipap and diuresis. BNP >33K -- 2D echo 03/14/14 shows normal LV function EF 50-55% with mild to mod LVH, Grade 4 DD, mod AI, mild MR, mod-severe TR, PA peak pressure 61 mm Hg.  -- She is now on PO Lasix 80mg  BID. -- Positive 5.3 L ( I/Os were not recorded  Yesterday at all ) and up 7 lbs since admission. She is still SOB and has orthopnea  Atrial Flutter: status post TEE cardioversion in December 2013, maintaining NSR  -- Continue Lopressor 50mg  BID.  -- Previously on Xarelto but this was discontinued. Dr. Domenic Polite not certain when this dropped off her list. She is on aspirin. She has had no obvious recurrences, and is in sinus rhythm now. Patient decided not to resume.   Pulmonary HTN: PASP of 61 mmHg. Likely related to significant left sided diastolic dysfunction. Her PASP may be underestimated by echo due to her significant TR. In the future may consider RHC to further evaluate her pulmonary HTN, her history of lupus does  put her at risk for primary arterial pulmonary htn as well, a RHC would help distinguish this, and will be considered at future follow ups (per Dr. Harl Bowie).  -- Plan right heart cath. However, per Dr. Nelly Laurence note this might not be done this admission.  Hypertension: relatively well controlled. Continue Norvasc 10mg  and metoprolol 50mg  BID  -- Hyzaar D/Cd due to AKI  Lupus - continue Plaquenil and prednisone. Methotrexate held.   Anemia of chronic disease secondary to Lupus and CKD. Severe.  -- Continue ESA as aranesp 200/week and give IV feraheme to treat fe deficiency. Haptoglobin normal.  -- Transfused a unit yesterday for Hb 7.6 for a little bigger cushion for biopsy. Hg 8.8 s/p transfusion  Acute on chronic CKD: (baseline creatinine 1.2-1.4)  -- Nephrology is following and biopsy done yesterday. -- Avoiding ACE/ARB. Methotrexate held. Pulsing steroids. Creat is stable. Currently 2.97, which is slightly higher than yesterday   Diabetes mellitus, type II   Tyrell Antonio PA-C  Pager [phone]  Attending Note:   The patient was seen and examined.  Agree with assessment and plan as noted above.  Changes made to the above note as needed.  Her baseline creatinine is ~ 3.0.   She appears comfortable  I/O are + for the past several days . Will increase her lasix to see if we can get some volume off - which will help her diastolic dysfunction and her pulmonary hypertension.   Awaiting results of renal bx.   Thayer Headings, Brooke Bonito., MD, [facility] 03/21/2014, 10:52 AM

## 2014-03-21 NOTE — Progress Notes (Signed)
On-call hospitalist orders also given to hold insulin, meal, and meal coverage until BMET results have been called into the attending doctor and doctor will address from there on.

## 2014-03-21 NOTE — Progress Notes (Signed)
Background:  68 yo AAF with SLE ( on chronic pred/plaquenil and methotrexate, followed by Dr. Amil Amen), DM, HTN, diastolic dysfunction and severe anemia, with acute kidney injury on chronic (baseline creatinine 1.2-1.4- was 1.2 on 02/16/14)  associated with proteinuria, low grade microscopic hematuria, + ANA, + atypical p-ANCA raising question of lupus nephritis vs overlap syndrome. Picture confounded to some degree by ARB, NSAIDS and acute heart failure. S/p diagnostic renal biopsy.  Impression/Plan  1. Acute kidney injury - h/o SLE; + ANA and + atypical p-ANCA with proteinuria and hematuria. S/p renal biopsy for definitive diagnosis- will not have result til likely Thursday.  S/p pulse solumedrol 500 mg IV QD Sat, Sun and Mon. Will now place on prednisone 40 q day. Continue plaquenil. Continue to hold methotrexate for right now.  Avoid further NSAIDS or ARB at this time. Strict intake and output- not sure is being done. Stopped IVF's to avoid further CHF issues.  Creatinine stable today 2. Anemia - severe. Not sure what workup done to date. Was to have started procrit 40,000 once per week but had received only one dose PTA. Continue ESA as aranesp 200/week and give IV feraheme to treat fe deficiency. Haptoglobin normal. Transfused a unit Sunday. 3. S/p acute on chronic diastolic CHF - stopped IVF. Changed lasix to po. Volume stable but is heavy. Increased to 160 BID today to keep her out of trouble from a CHF point of view 4. SLE - continue pred/plaquenil. Continue to hold MTX for now.  Would recommend getting Dr. Amil Amen involved for historical aspects of her lupus 5. H/o atrial flutter - SR at present 6. DM2 - sliding scale insulin; pulse steroids may affect DM control adversely- per primary 7. HTN - avoid ACE or ARB at this time; on BB. . Added amlodipine 10 at hs 6/20- BP is reasonable 8. Fevers - fevers at [facility]. On empiric levaquin, presently afebrile 9. Hyperparathyroidism - no previous Rx that I  can see. Started oral calcitriol 0.25/day 10. Metabolic acidosis - started po bicarb. Increase to 2 tid- is better  Subjective:  Very pleasant- Sugar up with steroids   Objective Vital signs in last 24 hours: Filed Vitals:   03/20/14 2046 03/20/14 2051 03/20/14 2246 03/21/14 0643  BP: 142/52  155/50 141/54  Pulse: 75 73 76 81  Temp: 97.6 F (36.4 C)   97.9 F (36.6 C)  TempSrc: Oral   Oral  Resp: 20 18    Height:      Weight:    96.253 kg (212 lb 3.2 oz)  SpO2: 99% 99%  97%   Weight change: -0.347 kg (-12.2 oz)  Intake/Output Summary (Last 24 hours) at 03/21/14 1106 Last data filed at 03/21/14 0942  Gross per 24 hour  Intake    637 ml  Output    200 ml  Net    437 ml   Physical Exam:  BP 141/54  Pulse 81  Temp(Src) 97.9 F (36.6 C) (Oral)  Resp 18  Ht 5\' 5"  (1.651 m)  Wt 96.253 kg (212 lb 3.2 oz)  BMI 35.31 kg/m2  SpO2 97% Gen: Pleasant BF NAD  Up in the chair Skin: no rash, cyanosis but has chronic darkening and skin changes LE's  Neck: no JVD  Chest: Lung fields clear  Heart: regular, no rub or gallop S1S2 No S3 2/6 murmur aortic area/LSB unchanged  No diastolic murmur or rub  Abdomen: soft, obese, no focal tenderness  Ext: some edema of the LE's.  Neuro: alert, Ox3, no focal deficit  Recent Labs Lab 03/15/14 0551 03/16/14 0728 03/16/14 0835 03/17/14 0541 03/17/14 1948 03/18/14 0348 03/19/14 0400 03/20/14 0244 03/21/14 0724  NA 134* 131*  --  134* 131* 131* 132* 131* 135*  K 3.8 3.8  --  4.4 4.1 4.0 4.3 3.5* 3.9  CL 99 95*  --  98 95* 95* 96 94* 96  CO2 18* 20  --  21 19 16* 16* 19 22  GLUCOSE 222* 225*  --  121* 134* 126* 221* 350* 481*  BUN 38* 42*  --  48* 51* 53* 62* 74* 83*  CREATININE 2.38* 2.71*  --  3.08* 3.20* 3.23* 3.20* 2.85* 2.97*  CALCIUM 7.2* 7.4* 7.0* 7.0* 7.2* 7.4* 7.7* 8.2* 8.5  PHOS  --  2.7  --  2.2*  --  2.4 4.7* 4.7*  --     Recent Labs Lab 03/17/14 1948 03/18/14 0348 03/19/14 0400 03/20/14 0244  AST 34  --   --    --   ALT 16  --   --   --   ALKPHOS 144*  --   --   --   BILITOT 1.1  --   --   --   PROT 7.0  --   --   --   ALBUMIN 2.2* 2.0* 2.0* 2.1*    Recent Labs Lab 03/15/14 0551  03/17/14 1948 03/18/14 0348 03/19/14 0400 03/20/14 0244  WBC 9.0  < > 8.3 7.5 6.9 8.4  NEUTROABS 6.5  --   --   --   --   --   HGB 8.6*  < > 7.7* 7.3* 7.6* 8.8*  HCT 25.3*  < > 23.1* 21.7* 22.4* 25.7*  MCV 83.5  < > 82.8 82.2 82.7 80.6  PLT 160  < > 191 181 169 213  < > = values in this interval not displayed. No results found for this basename: CKTOTAL, CKMB, CKMBINDEX, TROPONINI,  in the last 168 hours  Recent Labs Lab 03/20/14 0618 03/20/14 1118 03/20/14 1552 03/20/14 2139 03/21/14 0637  GLUCAP 356* 284* 226* 343* 409*      Recent Labs Lab 03/17/14 0541  IRON 21*  TIBC 146*  FERRITIN 858*   Results for DRUE, HOLLE (MRN [id number]) as of 03/18/2014 13:58  Ref. Range 03/17/2014 19:48  Haptoglobin Latest Range: 45-215 mg/dL 243 (H)  Results for ICESIS, FOLLMER (MRN [id number]) as of 03/18/2014 13:58  Ref. Range 03/13/2014 09:10 03/14/2014 18:19 03/17/2014 09:26  Color, Urine Latest Range: YELLOW  YELLOW YELLOW YELLOW  APPearance Latest Range: CLEAR  CLEAR HAZY (A) HAZY (A)  Specific Gravity, Urine Latest Range: 1.005-1.030  >1.030 (H) 1.025 >1.030 (H)  pH Latest Range: 5.0-8.0  5.5 5.5 5.5  Glucose Latest Range: NEGATIVE mg/dL NEGATIVE NEGATIVE NEGATIVE  Bilirubin Urine Latest Range: NEGATIVE  SMALL (A) NEGATIVE NEGATIVE  Ketones, ur Latest Range: NEGATIVE mg/dL NEGATIVE NEGATIVE NEGATIVE  Protein Latest Range: NEGATIVE mg/dL 100 (A) 100 (A) >300 (A)  Urobilinogen, UA Latest Range: 0.0-1.0 mg/dL 0.2 0.2 1.0  Nitrite Latest Range: NEGATIVE  NEGATIVE NEGATIVE NEGATIVE  Leukocytes, UA Latest Range: NEGATIVE  NEGATIVE NEGATIVE NEGATIVE  Hgb urine dipstick Latest Range: NEGATIVE  TRACE (A) MODERATE (A) MODERATE (A)  WBC, UA Latest Range: <3 WBC/hpf  0-2 7-10  RBC / HPF Latest Range: <3  RBC/hpf 3-6 0-2 3-6  Squamous Epithelial / LPF Latest Range: RARE   MANY (A) MANY (A)  Bacteria, UA Latest Range: RARE  MANY (  A) MANY (A) MANY (A)  Casts Latest Range: NEGATIVE   HYALINE CASTS (A)    Medications:   . albuterol  3 mL Inhalation BID  . amLODipine  10 mg Oral QHS  . antiseptic oral rinse  15 mL Mouth Rinse q12n4p  . aspirin EC  81 mg Oral Daily  . atorvastatin  40 mg Oral q1800  . calcitRIOL  0.25 mcg Oral Daily  . chlorhexidine  15 mL Mouth Rinse BID  . darbepoetin (ARANESP) injection - NON-DIALYSIS  200 mcg Subcutaneous Q Sat-1800  . folic acid  1 mg Oral Daily  . furosemide  160 mg Oral BID  . hydroxychloroquine  200 mg Oral BID  . insulin aspart  0-20 Units Subcutaneous TID WC  . insulin aspart  0-5 Units Subcutaneous QHS  . insulin aspart  2 Units Subcutaneous TID WC  . levofloxacin (LEVAQUIN) IV  500 mg Intravenous Q48H  . metoprolol  50 mg Oral BID  . predniSONE  2.5 mg Oral Q breakfast  . sodium bicarbonate  1,300 mg Oral TID  . sodium chloride  3 mL Intravenous Q12H      GOLDSBOROUGH,KELLIE A   03/21/2014, 11:06 AM

## 2014-03-21 NOTE — Progress Notes (Signed)
[name] for Levaquin  Indication: empiric coverage for fever   No Known Allergies  Patient Measurements: Height: 5\' 5"  (165.1 cm) Weight: 212 lb 3.2 oz (96.253 kg) (bed) IBW/kg (Calculated) : 57  Vital Signs: Temp: 97.9 F (36.6 C) (06/23 0643) Temp src: Oral (06/23 0643) BP: 141/54 mmHg (06/23 0643) Pulse Rate: 81 (06/23 0643) Intake/Output from previous day: 06/22 0701 - 06/23 0700 In: 397 [P.O.:240; I.V.:3; IV Piggyback:154] Out: 200 [Urine:200] Intake/Output from this shift: Total I/O In: 240 [P.O.:240] Out: -   Labs:  Recent Labs  03/19/14 0400 03/20/14 0244 03/21/14 0724  WBC 6.9 8.4  --   HGB 7.6* 8.8*  --   PLT 169 213  --   CREATININE 3.20* 2.85* 2.97*   Estimated Creatinine Clearance: 20.8 ml/min (by C-G formula based on Cr of 2.97). No results found for this basename: Letta Median, VANCORANDOM, GENTTROUGH, GENTPEAK, GENTRANDOM, TOBRATROUGH, TOBRAPEAK, TOBRARND, AMIKACINPEAK, AMIKACINTROU, AMIKACIN,  in the last 72 hours   Microbiology: Recent Results (from the past 720 hour(s))  URINE CULTURE     Status: None   Collection Time    03/13/14  9:10 AM      Result Value Ref Range Status   Specimen Description URINE, CATHETERIZED   Final   Special Requests NONE   Final   Culture  Setup Time     Final   Value: 03/13/2014 23:47     Performed at [facility]     Final   Value: NO GROWTH     Performed at [facility]   Culture     Final   Value: NO GROWTH     Performed at [facility]   Report Status 03/14/2014 FINAL   Final  MRSA PCR SCREENING     Status: None   Collection Time    03/14/14  6:02 PM      Result Value Ref Range Status   MRSA by PCR NEGATIVE  NEGATIVE Final   Comment:            The GeneXpert MRSA Assay (FDA     approved for NASAL specimens     only), is one component of a     comprehensive MRSA colonization     surveillance program. It is not      intended to diagnose MRSA     infection nor to guide or     monitor treatment for     MRSA infections.  URINE CULTURE     Status: None   Collection Time    03/14/14  6:34 PM      Result Value Ref Range Status   Specimen Description URINE, CATHETERIZED   Final   Special Requests IMMUNE:COMPROMISED   Final   Culture  Setup Time     Final   Value: 03/15/2014 14:46     Performed at [facility]     Final   Value: NO GROWTH     Performed at [facility]   Culture     Final   Value: NO GROWTH     Performed at [facility]   Report Status 03/16/2014 FINAL   Final  CULTURE, BLOOD (ROUTINE X 2)     Status: None   Collection Time    03/14/14  6:59 PM      Result Value Ref Range Status   Specimen Description BLOOD RIGHT ARM POSSIBLE TRANSFUSION REACTION  Final   Special Requests     Final   Value: BOTTLES DRAWN AEROBIC AND ANAEROBIC AEB=6CC ANA=7CC   Culture NO GROWTH 6 DAYS   Final   Report Status 03/20/2014 FINAL   Final  CULTURE, BLOOD (ROUTINE X 2)     Status: None   Collection Time    03/14/14  7:26 PM      Result Value Ref Range Status   Specimen Description BLOOD LEFT ARM POSSIBLE TRANSFUSION REACTION   Final   Special Requests BOTTLES DRAWN AEROBIC ONLY 4CC   Final   Culture NO GROWTH 6 DAYS   Final   Report Status 03/20/2014 FINAL   Final  CULTURE, BLOOD (ROUTINE X 2)     Status: None   Collection Time    03/17/14  9:11 AM      Result Value Ref Range Status   Specimen Description BLOOD RIGHT HAND   Final   Special Requests BOTTLES DRAWN AEROBIC ONLY 10CC   Final   Culture NO GROWTH 4 DAYS   Final   Report Status PENDING   Incomplete  CULTURE, BLOOD (ROUTINE X 2)     Status: None   Collection Time    03/17/14  9:17 AM      Result Value Ref Range Status   Specimen Description BLOOD LEFT HAND   Final   Special Requests BOTTLES DRAWN AEROBIC ONLY 8CC   Final   Culture NO GROWTH 4 DAYS   Final   Report Status PENDING   Incomplete     Anti-infectives   Start     Dose/Rate Route Frequency Ordered Stop   03/18/14 1200  levofloxacin (LEVAQUIN) IVPB 750 mg  Status:  Discontinued     750 mg 100 mL/hr over 90 Minutes Intravenous Every 48 hours 03/16/14 1032 03/18/14 0854   03/18/14 1200  levofloxacin (LEVAQUIN) IVPB 500 mg     500 mg 100 mL/hr over 60 Minutes Intravenous Every 48 hours 03/18/14 0854     03/16/14 1200  levofloxacin (LEVAQUIN) IVPB 750 mg     750 mg 100 mL/hr over 90 Minutes Intravenous  Once 03/16/14 1024 03/16/14 1354   03/14/14 2000  vancomycin (VANCOCIN) IVPB 1000 mg/200 mL premix  Status:  Discontinued     1,000 mg 200 mL/hr over 60 Minutes Intravenous Every 24 hours 03/14/14 1848 03/16/14 1018   03/14/14 2000  piperacillin-tazobactam (ZOSYN) IVPB 3.375 g  Status:  Discontinued     3.375 g 12.5 mL/hr over 240 Minutes Intravenous Every 8 hours 03/14/14 1848 03/16/14 1018   03/13/14 1500  hydroxychloroquine (PLAQUENIL) tablet 200 mg     200 mg Oral 2 times daily 03/13/14 1314       Assessment: 68 yo female now presents form outside hospital for renal work up. Patient is on antibiotics due to previous low grade fevers with unclear source. Culture data remains negative. With renal dysfunction- est CrCl 20-59mL/min.  Goals of Therapy: Eradication of Infection  Plan:  1. Continue Levaquin 500mg  IV q48h- to stop after dose on 6/24. Noted plan was for 3 more days of therapy per notes yesterday 2. Pharmacy to sign off. Please reconsult if needed.  Lauren D. Bajbus, PharmD, BCPS Clinical Pharmacist Pager: [phone] 03/21/2014 11:26 AM

## 2014-03-22 ENCOUNTER — Other Ambulatory Visit (HOSPITAL_COMMUNITY): Payer: Medicare Other

## 2014-03-22 ENCOUNTER — Ambulatory Visit (HOSPITAL_COMMUNITY): Payer: Medicare Other

## 2014-03-22 DIAGNOSIS — J9600 Acute respiratory failure, unspecified whether with hypoxia or hypercapnia: Secondary | ICD-10-CM | POA: Diagnosis not present

## 2014-03-22 DIAGNOSIS — E871 Hypo-osmolality and hyponatremia: Secondary | ICD-10-CM | POA: Diagnosis not present

## 2014-03-22 DIAGNOSIS — N179 Acute kidney failure, unspecified: Secondary | ICD-10-CM | POA: Diagnosis not present

## 2014-03-22 DIAGNOSIS — R0602 Shortness of breath: Secondary | ICD-10-CM

## 2014-03-22 DIAGNOSIS — N183 Chronic kidney disease, stage 3 unspecified: Secondary | ICD-10-CM | POA: Diagnosis not present

## 2014-03-22 DIAGNOSIS — J449 Chronic obstructive pulmonary disease, unspecified: Secondary | ICD-10-CM | POA: Diagnosis not present

## 2014-03-22 DIAGNOSIS — I5033 Acute on chronic diastolic (congestive) heart failure: Secondary | ICD-10-CM | POA: Diagnosis not present

## 2014-03-22 DIAGNOSIS — I10 Essential (primary) hypertension: Secondary | ICD-10-CM | POA: Diagnosis not present

## 2014-03-22 LAB — CULTURE, BLOOD (ROUTINE X 2)
CULTURE: NO GROWTH
Culture: NO GROWTH

## 2014-03-22 LAB — GLUCOSE, CAPILLARY
GLUCOSE-CAPILLARY: 125 mg/dL — AB (ref 70–99)
Glucose-Capillary: 112 mg/dL — ABNORMAL HIGH (ref 70–99)
Glucose-Capillary: 198 mg/dL — ABNORMAL HIGH (ref 70–99)
Glucose-Capillary: 206 mg/dL — ABNORMAL HIGH (ref 70–99)
Glucose-Capillary: 226 mg/dL — ABNORMAL HIGH (ref 70–99)

## 2014-03-22 LAB — RENAL FUNCTION PANEL
Albumin: 1.9 g/dL — ABNORMAL LOW (ref 3.5–5.2)
BUN: 93 mg/dL — ABNORMAL HIGH (ref 6–23)
CHLORIDE: 99 meq/L (ref 96–112)
CO2: 23 meq/L (ref 19–32)
CREATININE: 3.09 mg/dL — AB (ref 0.50–1.10)
Calcium: 8.8 mg/dL (ref 8.4–10.5)
GFR calc Af Amer: 17 mL/min — ABNORMAL LOW (ref >90)
GFR, EST NON AFRICAN AMERICAN: 14 mL/min — AB (ref >90)
GLUCOSE: 157 mg/dL — AB (ref 70–99)
Phosphorus: 4.5 mg/dL (ref 2.3–4.6)
Potassium: 3.3 mEq/L — ABNORMAL LOW (ref 3.7–5.3)
Sodium: 138 mEq/L (ref 137–147)

## 2014-03-22 NOTE — Progress Notes (Signed)
Patient Name: [name] Date of Encounter: 03/22/2014     Principal Problem:   Renal failure (ARF), acute on chronic Active Problems:   Atrial flutter   Morbid obesity   Type 2 diabetes mellitus with diabetic neuropathy   SLE (systemic lupus erythematosus)   Essential hypertension, benign   COPD (chronic obstructive pulmonary disease)   Anemia in chronic renal disease   Dehydration   Hyponatremia   Acute on chronic renal failure   Dyspnea   Diastolic CHF, acute on chronic   Acute respiratory failure with hypoxia   Fever, unspecified   Acute on chronic diastolic congestive heart failure    SUBJECTIVE   Patient less SOB and was able to sleep last night on her usual incline. No PND. She reports a mild cough x1-2 days    CURRENT MEDS . albuterol  3 mL Inhalation BID  . amLODipine  10 mg Oral QHS  . antiseptic oral rinse  15 mL Mouth Rinse q12n4p  . aspirin EC  81 mg Oral Daily  . atorvastatin  40 mg Oral q1800  . calcitRIOL  0.25 mcg Oral Daily  . chlorhexidine  15 mL Mouth Rinse BID  . darbepoetin (ARANESP) injection - NON-DIALYSIS  200 mcg Subcutaneous Q Sat-1800  . folic acid  1 mg Oral Daily  . furosemide  160 mg Oral BID  . hydroxychloroquine  200 mg Oral BID  . insulin aspart  0-20 Units Subcutaneous TID WC  . insulin aspart  0-5 Units Subcutaneous QHS  . insulin aspart  2 Units Subcutaneous TID WC  . insulin detemir  15 Units Subcutaneous BID  . levofloxacin (LEVAQUIN) IV  500 mg Intravenous Q48H  . metoprolol  50 mg Oral BID  . predniSONE  40 mg Oral Q breakfast  . sodium bicarbonate  1,300 mg Oral TID  . sodium chloride  3 mL Intravenous Q12H    OBJECTIVE  Filed Vitals:   03/21/14 0643 03/21/14 1352 03/21/14 2047 03/22/14 0534  BP: 141/54 146/48 134/45 141/52  Pulse: 81 67 70 65  Temp: 97.9 F (36.6 C) 97.5 F (36.4 C) 97.2 F (36.2 C) 97.8 F (36.6 C)  TempSrc: Oral Oral Oral Oral  Resp:  18 18 18   Height:      Weight: 212 lb 3.2  oz (96.253 kg)   210 lb 6.4 oz (95.437 kg)  SpO2: 97% 98% 100% 98%    Intake/Output Summary (Last 24 hours) at 03/22/14 1104 Last data filed at 03/21/14 1852  Gross per 24 hour  Intake    540 ml  Output    200 ml  Net    340 ml   Filed Weights   03/20/14 0422 03/21/14 0643 03/22/14 0534  Weight: 212 lb 15.4 oz (96.6 kg) 212 lb 3.2 oz (96.253 kg) 210 lb 6.4 oz (95.437 kg)    PHYSICAL EXAM  General: Pleasant, NAD. Neuro: Alert and oriented X 3. Moves all extremities spontaneously. Psych: Normal affect. HEENT:  Normal  Neck: Supple without bruits + JVD. Lungs:  Resp regular and unlabored, crackles at bases. Mild cough.   Heart: RRR no s3, s4, ++ murmur Abdomen: Soft, non-tender, non-distended, BS + x 4.  Extremities: No clubbing, cyanosis 1+ edema. Marland Kitchen DP/PT/Radials 2+ and equal bilaterally.  Accessory Clinical Findings  CBC  Recent Labs  03/20/14 0244  WBC 8.4  HGB 8.8*  HCT 25.7*  MCV 80.6  PLT 123456   Basic Metabolic Panel  Recent Labs  03/20/14 0244  03/21/14 0724 03/22/14 0339  NA 131* 135* 138  K 3.5* 3.9 3.3*  CL 94* 96 99  CO2 19 22 23   GLUCOSE 350* 481* 157*  BUN 74* 83* 93*  CREATININE 2.85* 2.97* 3.09*  CALCIUM 8.2* 8.5 8.8  PHOS 4.7*  --  4.5   Liver Function Tests  Recent Labs  03/20/14 0244 03/22/14 0339  ALBUMIN 2.1* 1.9*    TELE  Not on tele  Radiology/Studies  Dg Chest 2 View  03/16/2014   CLINICAL DATA:  Fever.  EXAM: CHEST  2 VIEW  COMPARISON:  March 15, 2014.  FINDINGS: Stable cardiomegaly. No pneumothorax or pleural effusion is noted. No acute pulmonary disease is noted. Bony thorax appears intact  IMPRESSION: No acute cardiopulmonary abnormality seen.   Electronically Signed   By: Sabino Dick M.D.   On: 03/16/2014 12:16   Dg Chest 2 View  03/13/2014   CLINICAL DATA:  Weakness.  End-stage renal disease.  EXAM: CHEST  2 VIEW  COMPARISON:  09/04/2012  FINDINGS: The heart is enlarged but stable. Stable tortuosity and calcification  of the thoracic aorta. Mild vascular congestion but no overt pulmonary edema. No pleural effusions or focal infiltrates. The bony thorax is intact.  IMPRESSION: Stable cardiac enlargement.  Mild vascular congestion but no infiltrates or effusions.   Electronically Signed   By: Kalman Jewels M.D.   On: 03/13/2014 09:55   US Renal  03/15/2014   CLINICAL DATA:  Assess for renal size, hypertension.  EXAM: RENAL/URINARY TRACT ULTRASOUND COMPLETE  COMPARISON:  CT abdomen and pelvis January 11, 2004  FINDINGS: Right Kidney:  Length: 11.9 cm. Echogenicity within normal limits. No hydronephrosis visualized. There is a 3.2 x 2.9 x 3 cm anechoic cyst in the upper pole right kidney.  Left Kidney:  Length: 13.2 cm. Echogenicity within normal limits. No mass visualized. There is dilated calyx in the midpole left kidney. There is no hydronephrosis.  Bladder:  Not visualized due to overlying bowel gas and patient's body habitus per ultrasound technologist.  IMPRESSION: Bilateral normal renal size. Focal dilated calyx in the midpole left kidney without hydronephrosis.   Electronically Signed   By: Abelardo Diesel M.D.   On: 03/15/2014 17:34   US Carotid Duplex Bilateral  02/21/2014   CLINICAL DATA:  Carotid bruit, history of hypertension, hyperlipidemia, diabetes  EXAM: BILATERAL CAROTID DUPLEX ULTRASOUND  TECHNIQUE: Pearline Cables scale imaging, color Doppler and duplex ultrasound were performed of bilateral carotid and vertebral arteries in the neck.  COMPARISON:  None.  FINDINGS: Criteria: Quantification of carotid stenosis is based on velocity parameters that correlate the residual internal carotid diameter with NASCET-based stenosis levels, using the diameter of the distal internal carotid lumen as the denominator for stenosis measurement.  The following velocity measurements were obtained:  RIGHT  ICA:  96/17 cm/sec  CCA:  [id number] cm/sec  SYSTOLIC ICA/CCA RATIO:  [id number]  DIASTOLIC ICA/CCA RATIO:  2.3  ECA:  80 cm/sec  LEFT  ICA:  85/20  cm/sec  CCA:  [id number] cm/sec  SYSTOLIC ICA/CCA RATIO:  0.9  DIASTOLIC ICA/CCA RATIO:  1.4  ECA:  77 cm/sec  RIGHT CAROTID ARTERY: There is a minimal amount of eccentric mixed echogenic plaque within the mid and distal aspects of the right common carotid artery (images 6, 7, 10 and 11). There is a minimal amount of eccentric mixed echogenic plaque within the right carotid bulb and (images 16), not resulting in elevated peak systolic velocities within the interrogated course of the right  internal carotid artery to suggest a hemodynamically significant stenosis.  RIGHT VERTEBRAL ARTERY:  Antegrade flow  LEFT CAROTID ARTERY: There is a minimal amount of eccentric mixed echogenic plaque within the mid and distal aspects of the left common carotid artery (images 41, 42, 45 and 46). There is a very minimal amount of eccentric mixed echogenic partially shadowing plaque with left carotid bulb (image 51) extending to involve the origin and proximal aspect of the left internal carotid artery (image 59), not resulting in elevated peak systolic velocities within the interrogated course of the left internal carotid artery to suggest a hemodynamically significant stenosis.  LEFT VERTEBRAL ARTERY:  Antegrade flow  IMPRESSION: Minimal amount of bilateral atherosclerotic plaque, not resulting in a hemodynamically significant stenosis.   Electronically Signed   By: Sandi Mariscal M.D.   On: 02/21/2014 10:39   Dg Chest Port 1 View  03/15/2014   CLINICAL DATA:  Short of breath.  EXAM: PORTABLE CHEST - 1 VIEW  COMPARISON:  03/14/2014  FINDINGS: Mild enlargement of the cardiopericardial silhouette, stable. Central vascular congestion and hazy perihilar airspace opacity as well as central interstitial thickening is stable consistent with congestive heart failure and pulmonary edema.  No pneumothorax.  IMPRESSION: No change from the prior study. Findings remain consistent with mild congestive heart failure.   Electronically Signed   By: Lajean Manes M.D.   On: 03/15/2014 10:16   Dg Chest Port 1 View  03/14/2014   CLINICAL DATA:  Shortness of breath.  EXAM: PORTABLE CHEST - 1 VIEW  COMPARISON:  03/13/2014  FINDINGS: Chronic cardiopericardial enlargement. There is vascular pedicle widening. There is newly cephalized blood flow and interstitial indistinctness. No asymmetric opacity, definitive effusion, or pneumothorax.  IMPRESSION: CHF.   Electronically Signed   By: Jorje Guild M.D.   On: 03/14/2014 17:55   2Decho 03/14/14:Study Conclusions - Left ventricle: The cavity size was normal. Wall thickness was increased increased in a pattern of mild to moderate LVH. Systolic function was normal. The estimated ejection fraction was in the range of 50% to 55%. Doppler parameters are consistent with an irreversible restrictive pattern, indicative of decreased left ventricular diastolic compliance and/or increased left atrial pressure (grade 4 diastolic dysfunction). - Aortic valve: Mildly calcified annulus. Trileaflet; mildly thickened leaflets. There was moderate regurgitation. Regurgitation pressure half-time: 324 ms. - Aortic root: The aortic root was normal in size. - Mitral valve: Mildly calcified annulus. Moderately thickened leaflets . There was mild regurgitation. - Left atrium: The atrium was moderately dilated. - Pulmonary veins: There is systolic blunting of pulmonary vein flow suggestive of elevated LA pressure. - Right ventricle: The cavity size was mildly to moderately dilated. - Right atrium: The atrium was moderately dilated. - Tricuspid valve: There was moderate-severe regurgitation. The jet is eccentric and posterior, this may lead to underestimation of severity. The TR vena contracta measures 0.4 cm. There is systolic reversal of hepatic vein flow suggestive of severe TR. The morphological etiology of the TR is unclear from available images. - Pulmonary arteries: Systolic pressure was moderately increased. PA  peak pressure: 61 mm Hg (S). Pulmonary pressure may be underestimated in the setting of significant TR. - Inferior vena cava: The vessel was dilated. The respirophasic diameter changes were blunted (< 50%), consistent with elevated central venous pressure. - Technically adequate study.   ASSESSMENT AND PLAN  68 yo female hx of DM2, HTN, COPD, anemia, CKD stage III, and aflutter who was admitted on 03/13/14 with generalized weakness and SOB.  She had multiple metabolic abnormalities including hyponatremia with Na 129, AKI on CKD, and anemia with Hgb of 7.9. She was thought to be dehydrated and received IVF and was also transfused pRBCs. On 03/14/14 patient developed worsening SOB requiring transfer to ICU and being placed on Bipap. CXR showed pulmonary edema. She was given IV lasix with improvement of symptoms and now is weaned off bipap.   Acute on Chronic Diastolic CHF-- initially presented hypovolemic, in setting of IVF and blood transfusions became fairly acutely volume overloaded requiring transfer to ICU with Bipap and diuresis. BNP >33K -- 2D echo 03/14/14 shows normal LV function EF 50-55% with mild to mod LVH, Grade 4 DD, mod AI, mild MR, mod-severe TR, PA peak pressure 61 mm Hg.  -- She is now on PO Lasix 160  BID. Would appreciate nephrology's input     Atrial Flutter: status post TEE cardioversion in December 2013, maintaining NSR   -- Continue Lopressor 50mg  BID.  -- Previously on Xarelto but this was discontinued. Dr. Domenic Polite not certain when this dropped off her list. She is on aspirin. She has had no obvious recurrences, and is in sinus rhythm now. Patient decided not to resume.   Pulmonary HTN: PASP of 61 mmHg. Likely related to significant left sided diastolic dysfunction. Her PASP may be underestimated by echo due to her significant TR. In the future may consider RHC to further evaluate her pulmonary HTN, her history of lupus does put her at risk for primary arterial pulmonary  htn as well, a RHC would help distinguish this, and will be considered at future follow ups (per Dr. Harl Bowie).  -- Plan right heart cath. However, per Dr. Nelly Laurence note this might not be done this admission.  Hypertension: relatively well controlled. Continue Norvasc 10mg  and metoprolol 50mg  BID  -- Hyzaar D/Cd due to AKI  Lupus - continue Plaquenil and prednisone. Methotrexate held.  Anemia of chronic disease secondary to Lupus and CKD. Severe.  -- Continue ESA as aranesp 200/week and give IV feraheme to treat fe deficiency. Haptoglobin normal.  -- Transfused a unit yesterday for Hb 7.6 for a little bigger cushion for biopsy. Hg 8.8 s/p transfusion  Acute on chronic CKD: (baseline creatinine 1.2-1.4)  -- Nephrology is following and biopsy done yesterday. -- Avoiding ACE/ARB. Methotrexate held. Pulsing steroids. Creat is stable. Currently 2.97, which is slightly higher than yesterday   Diabetes mellitus, type II     Thayer Headings, Brooke Bonito., MD, [facility] 03/22/2014, 11:11 AM [address],  [place] Pager [phone]

## 2014-03-22 NOTE — Progress Notes (Signed)
[facility] PROGRESS NOTE Interim History: 68 year old female with history of lupus and chronic kidney disease stage III (baseline creatinine 1.2) and chronic anemia who presents to the emergency room with generalized weakness. She was found to have a hemoglobin of 7.3 and creatinine of 2. She was seen by cardiology and started on intravenous Lasix. Unfortunately, her creatinine has continued to rise. Currently it is 3 and her urine output is now unimpressive. She's been having intermittent fevers as high as 103. Renal workup showed positive ANA, double-stranded DNA as well as positive p-ANCA.Marland Kitchen Biopsy done 6.22.2015.it appear septic and her fevers may relate to her underlying connective tissue disease/vasculitis. Status post biopsy; waiting results and will follow renal recommendations. Concerns for lupus nephritis.  Filed Weights   03/20/14 0422 03/21/14 0643 03/22/14 0534  Weight: 96.6 kg (212 lb 15.4 oz) 96.253 kg (212 lb 3.2 oz) 95.437 kg (210 lb 6.4 oz)        Intake/Output Summary (Last 24 hours) at 03/22/14 1109 Last data filed at 03/21/14 [id number]  Gross per 24 hour  Intake    540 ml  Output    200 ml  Net    340 ml     Assessment/Plan: Acute respiratory failure with hypoxia  - Secondary to acute on chronic diastolic heart failure in setting of worsening anemia.  - Patient given IV fluids for renal failure and transfusions for anemia on admission. Developed worsening sob with hypoxia. Transferred to ICU on BiPap.  - She was started on IV Lasix and initially had diuresis with improvement in her respiratory status.  - Echo with grade 4 diastolic dysfunction, BNP elevated, chest xray consistent with CHF.  - Cardiology was following the patient and felt that her respiratory failure may have been due to rapid changes in volume status.  - Poor UOP, weight on changed.  Renal failure (ARF), acute on chronicSLE (systemic lupus erythematosus):  - per renal service  - h/o SLE; + ANA  and + atypical p-ANCA with proteinuria and hematuria.  - biopsy performed w/o complications' will follow results.  - Continue steroids pulse therapy  - Avoid NSAIDs/ARB, - Strict intake and output  - Follow daily renal function  - Increased lasix, cont to monitor Cr. - Creatinine improving, today 2.85 - Plan is to continue prednisone and plaquenil eventually  - Currently receiving oral steroids   Fever  - Patient has remained afebrile  - continue empirically therapy with levaquin; will treat for 3 more days  - fever may be related to her underlying connective tissue disorder/vasculitis.   Hyponatremia:  -Likely related to #2.  -stable    Anemia in chronic renal disease and Lupus:  - Somewhat symptomatic on presentation with hemoglobin of 7.3. S/p 1 unit PRBC's since admission.  - Will continue now aranesp and IV iron  - Follow Hgb trend and transfuse 1 unit of PRBCs today for better cushion in anticipation of kidney biopsy  - No source of bleeding appreciated.  Essential hypertension, benign:  - Appears controlled. home medications include losartan and HCTZ which are on hold for now (due to worsening kidney function and vol overload)  - Continue her beta blocker and monitor closely.  - Amlodipine added today for better control of BP   Atrial flutter:  - sinus rhythm on telemetry and EKG  - no anticoagulation currently as renal biopsy anticipated on Monday (6/22)  - rate controlled  - will follow cardiology rec's  - Continue beta blocker   Morbid  obesity:  - BMI 34.3.  - Encourage to follow low calorie diet and to increase exercise   Acute on hronic diastolic heart failure  -per cardiology  -stable and compensated currently  -continue PO diuretics a current dose  -denies SOB.  Type 2 diabetes mellitus with diabetic neuropathy:  -Hemoglobin A1c 6.9. Will hold her oral agents for now.  -uncontrolled now due to steroids  -anticipate improvement once start tapering    -meanwhile will continue SSI resistant, meal coverage and start levemir BID    Code Status: full  Family Communication: Discussed with husband at the bedside  Disposition Plan: to be determined     Consultants:  Renal  cardiology  Procedures: ECHO: none  Antibiotics:  Cont levaquin for 8 days.  HPI/Subjective: No complains  Objective: Filed Vitals:   03/21/14 0643 03/21/14 1352 03/21/14 2047 03/22/14 0534  BP: 141/54 146/48 134/45 141/52  Pulse: 81 67 70 65  Temp: 97.9 F (36.6 C) 97.5 F (36.4 C) 97.2 F (36.2 C) 97.8 F (36.6 C)  TempSrc: Oral Oral Oral Oral  Resp:  18 18 18   Height:      Weight: 96.253 kg (212 lb 3.2 oz)   95.437 kg (210 lb 6.4 oz)  SpO2: 97% 98% 100% 98%     Exam:  General: Alert, awake, oriented x3, in no acute distress.  HEENT: No bruits, no goiter. -JVD, -HJ reflex Heart: Regular rate and rhythm, trace lower ext edema Lungs: Good air movement, clear Abdomen: Soft, nontender, nondistended, positive bowel sounds.     Data Reviewed: Basic Metabolic Panel:  Recent Labs Lab 03/17/14 0541  03/18/14 0348 03/19/14 0400 03/20/14 0244 03/21/14 0724 03/22/14 0339  NA 134*  < > 131* 132* 131* 135* 138  K 4.4  < > 4.0 4.3 3.5* 3.9 3.3*  CL 98  < > 95* 96 94* 96 99  CO2 21  < > 16* 16* 19 22 23   GLUCOSE 121*  < > 126* 221* 350* 481* 157*  BUN 48*  < > 53* 62* 74* 83* 93*  CREATININE 3.08*  < > 3.23* 3.20* 2.85* 2.97* 3.09*  CALCIUM 7.0*  < > 7.4* 7.7* 8.2* 8.5 8.8  PHOS 2.2*  --  2.4 4.7* 4.7*  --  4.5  < > = values in this interval not displayed. Liver Function Tests:  Recent Labs Lab 03/17/14 1948 03/18/14 0348 03/19/14 0400 03/20/14 0244 03/22/14 0339  AST 34  --   --   --   --   ALT 16  --   --   --   --   ALKPHOS 144*  --   --   --   --   BILITOT 1.1  --   --   --   --   PROT 7.0  --   --   --   --   ALBUMIN 2.2* 2.0* 2.0* 2.1* 1.9*   No results found for this basename: LIPASE, AMYLASE,  in the last 168  hours No results found for this basename: AMMONIA,  in the last 168 hours CBC:  Recent Labs Lab 03/17/14 0541 03/17/14 1948 03/18/14 0348 03/19/14 0400 03/20/14 0244  WBC 7.6 8.3 7.5 6.9 8.4  HGB 8.0* 7.7* 7.3* 7.6* 8.8*  HCT 24.3* 23.1* 21.7* 22.4* 25.7*  MCV 84.4 82.8 82.2 82.7 80.6  PLT 189 191 181 169 213   Cardiac Enzymes: No results found for this basename: CKTOTAL, CKMB, CKMBINDEX, TROPONINI,  in the last 168 hours BNP (  last 3 results)  Recent Labs  03/15/14 0551  PROBNP 33778.0*   CBG:  Recent Labs Lab 03/21/14 1103 03/21/14 1545 03/21/14 1638 03/21/14 2137 03/22/14 0631  GLUCAP 481* 326* 303* 196* 125*    Recent Results (from the past 240 hour(s))  URINE CULTURE     Status: None   Collection Time    03/13/14  9:10 AM      Result Value Ref Range Status   Specimen Description URINE, CATHETERIZED   Final   Special Requests NONE   Final   Culture  Setup Time     Final   Value: 03/13/2014 23:47     Performed at [facility]     Final   Value: NO GROWTH     Performed at [facility]   Culture     Final   Value: NO GROWTH     Performed at [facility]   Report Status 03/14/2014 FINAL   Final  MRSA PCR SCREENING     Status: None   Collection Time    03/14/14  6:02 PM      Result Value Ref Range Status   MRSA by PCR NEGATIVE  NEGATIVE Final   Comment:            The GeneXpert MRSA Assay (FDA     approved for NASAL specimens     only), is one component of a     comprehensive MRSA colonization     surveillance program. It is not     intended to diagnose MRSA     infection nor to guide or     monitor treatment for     MRSA infections.  URINE CULTURE     Status: None   Collection Time    03/14/14  6:34 PM      Result Value Ref Range Status   Specimen Description URINE, CATHETERIZED   Final   Special Requests IMMUNE:COMPROMISED   Final   Culture  Setup Time     Final   Value: 03/15/2014 14:46     Performed  at [place]     Final   Value: NO GROWTH     Performed at [facility]   Culture     Final   Value: NO GROWTH     Performed at [facility]   Report Status 03/16/2014 FINAL   Final  CULTURE, BLOOD (ROUTINE X 2)     Status: None   Collection Time    03/14/14  6:59 PM      Result Value Ref Range Status   Specimen Description BLOOD RIGHT ARM POSSIBLE TRANSFUSION REACTION   Final   Special Requests     Final   Value: BOTTLES DRAWN AEROBIC AND ANAEROBIC AEB=6CC ANA=7CC   Culture NO GROWTH 6 DAYS   Final   Report Status 03/20/2014 FINAL   Final  CULTURE, BLOOD (ROUTINE X 2)     Status: None   Collection Time    03/14/14  7:26 PM      Result Value Ref Range Status   Specimen Description BLOOD LEFT ARM POSSIBLE TRANSFUSION REACTION   Final   Special Requests BOTTLES DRAWN AEROBIC ONLY 4CC   Final   Culture NO GROWTH 6 DAYS   Final   Report Status 03/20/2014 FINAL   Final  CULTURE, BLOOD (ROUTINE X 2)     Status: None   Collection Time  03/17/14  9:11 AM      Result Value Ref Range Status   Specimen Description BLOOD RIGHT HAND   Final   Special Requests BOTTLES DRAWN AEROBIC ONLY 10CC   Final   Culture NO GROWTH 5 DAYS   Final   Report Status 03/22/2014 FINAL   Final  CULTURE, BLOOD (ROUTINE X 2)     Status: None   Collection Time    03/17/14  9:17 AM      Result Value Ref Range Status   Specimen Description BLOOD LEFT HAND   Final   Special Requests BOTTLES DRAWN AEROBIC ONLY 8CC   Final   Culture NO GROWTH 5 DAYS   Final   Report Status 03/22/2014 FINAL   Final     Studies: US Biopsy  03/20/2014   CLINICAL DATA:  Acute renal failure, proteinuria, history of lupus.  COMPARISON:  03/15/2014  TECHNIQUE: ULTRASOUND GUIDED RENAL CORE BIOPSY  The procedure, risks (including but not limited to bleeding, infection, organ damage ), benefits, and alternatives were explained to the patient. Questions regarding the procedure were encouraged and  answered. The patient understands and consents to the procedure. Survey ultrasound was performed and an appropriate skin entry site was localized. Site was marked, prepped with Betadine, draped in usual sterile fashion, infiltrated locally with 1% lidocaine. Intravenous Fentanyl and Versed were administered as conscious sedation during continuous cardiorespiratory monitoring by the radiology RN with a total moderate sedation time of less than 30 minutes.  Under real time ultrasound guidance, a 15 gauge trocar needle was advanced to the margin of the lower pole of the right kidney. A coaxial Bard core needle was used to obtain 3 16 gauge samples. The guide needle was removed. The core samples were submitted to pathology. The patient tolerated procedure well, with no immediate complication.  IMPRESSION: 1. Technically successful ultrasound-guided core renal biopsy , lower pole right kidney.   Electronically Signed   By: Arne Cleveland M.D.   On: 03/20/2014 16:18    Scheduled Meds: . albuterol  3 mL Inhalation BID  . amLODipine  10 mg Oral QHS  . antiseptic oral rinse  15 mL Mouth Rinse q12n4p  . aspirin EC  81 mg Oral Daily  . atorvastatin  40 mg Oral q1800  . calcitRIOL  0.25 mcg Oral Daily  . chlorhexidine  15 mL Mouth Rinse BID  . darbepoetin (ARANESP) injection - NON-DIALYSIS  200 mcg Subcutaneous Q Sat-1800  . folic acid  1 mg Oral Daily  . furosemide  160 mg Oral BID  . hydroxychloroquine  200 mg Oral BID  . insulin aspart  0-20 Units Subcutaneous TID WC  . insulin aspart  0-5 Units Subcutaneous QHS  . insulin aspart  2 Units Subcutaneous TID WC  . insulin detemir  15 Units Subcutaneous BID  . levofloxacin (LEVAQUIN) IV  500 mg Intravenous Q48H  . metoprolol  50 mg Oral BID  . predniSONE  40 mg Oral Q breakfast  . sodium bicarbonate  1,300 mg Oral TID  . sodium chloride  3 mL Intravenous Q12H   Continuous Infusions:    [name]  [facility] Pager [phone] If  8PM-8AM, please contact night-coverage at [email or web address], password [email or web address] 03/22/2014, 11:09 AM  LOS: 9 days     **Disclaimer: This note may have been dictated with voice recognition software. Similar sounding words can inadvertently be transcribed and this note may contain transcription errors which may not have been corrected upon publication of  note.**

## 2014-03-22 NOTE — Progress Notes (Signed)
Background:  68 yo AAF with SLE ( on chronic pred/plaquenil and methotrexate, followed by Dr. Amil Amen), DM, HTN, diastolic dysfunction and severe anemia, with acute kidney injury on chronic (baseline creatinine 1.2-1.4- was 1.2 on 02/16/14)  associated with proteinuria, low grade microscopic hematuria, + ANA, + atypical p-ANCA raising question of lupus nephritis vs overlap syndrome. Picture confounded to some degree by ARB, NSAIDS and acute heart failure. S/p diagnostic renal biopsy.  Impression/Plan  1. Acute kidney injury - h/o SLE; + ANA and + atypical p-ANCA with proteinuria and hematuria. S/p renal biopsy for definitive diagnosis- will not have result til likely Thursday.  S/p pulse solumedrol 500 mg IV QD Sat, Sun and Mon. Now on prednisone 40 q day. Continue plaquenil. Continue to hold methotrexate for right now.  Avoid further NSAIDS or ARB at this time. Strict intake and output- not sure is being done. Stopped IVF's to avoid further CHF issues.  Creatinine worsening slowly daily ? No uremic symptoms or indications for dialysis 2. Anemia - severe.  Was to have started procrit 40,000 once per week but had received only one dose PTA. Continue ESA as aranesp 200/week and give IV feraheme to treat fe deficiency. Haptoglobin normal. Transfused a unit Sunday.  Need to check in AM 3. S/p acute on chronic diastolic CHF - stopped IVF. Changed lasix to po. Volume stable but is heavy. Increased to 160 BID today to keep her out of trouble from a CHF point of view but dont think she is dry given blood pressure 4. SLE - continue pred/plaquenil. Continue to hold MTX for now.  Would recommend getting Dr. Amil Amen involved for historical aspects of her lupus 5. H/o atrial flutter - SR at present 6. DM2 - sliding scale insulin; pulse steroids have affected DM control adversely- per primary 7. HTN - avoid ACE or ARB at this time; on BB. . Added amlodipine 10 at hs 6/20- BP is reasonable- may improve further with volume  control 8. Fevers - fevers at [facility]. On empiric levaquin, presently afebrile 9. Hyperparathyroidism - no previous Rx that I can see. Started oral calcitriol 0.25/day 10. Metabolic acidosis - started po bicarb. Increase to 2 tid- is better  Subjective:  Very pleasant- does not seem to be having uremic symptoms but may be a little slow (baseline?)    Objective Vital signs in last 24 hours: Filed Vitals:   03/21/14 0643 03/21/14 1352 03/21/14 2047 03/22/14 0534  BP: 141/54 146/48 134/45 141/52  Pulse: 81 67 70 65  Temp: 97.9 F (36.6 C) 97.5 F (36.4 C) 97.2 F (36.2 C) 97.8 F (36.6 C)  TempSrc: Oral Oral Oral Oral  Resp:  18 18 18   Height:      Weight: 96.253 kg (212 lb 3.2 oz)   95.437 kg (210 lb 6.4 oz)  SpO2: 97% 98% 100% 98%   Weight change: -0.816 kg (-1 lb 12.8 oz)  Intake/Output Summary (Last 24 hours) at 03/22/14 1038 Last data filed at 03/21/14 1852  Gross per 24 hour  Intake    540 ml  Output    200 ml  Net    340 ml   Physical Exam:  BP 141/52  Pulse 65  Temp(Src) 97.8 F (36.6 C) (Oral)  Resp 18  Ht 5\' 5"  (1.651 m)  Wt 95.437 kg (210 lb 6.4 oz)  BMI 35.01 kg/m2  SpO2 98% Gen: Pleasant BF NAD  Up in the chair Skin: no rash, cyanosis but has chronic darkening and skin  changes LE's  Neck: no JVD  Chest: Lung fields clear  Heart: regular, no rub or gallop S1S2 No S3 2/6 murmur aortic area/LSB unchanged  No diastolic murmur or rub  Abdomen: soft, obese, no focal tenderness  Ext: some edema of the LE's.  Neuro: alert, Ox3, no focal deficit- a little slow  Recent Labs Lab 03/16/14 0728  03/17/14 0541 03/17/14 1948 03/18/14 0348 03/19/14 0400 03/20/14 0244 03/21/14 0724 03/22/14 0339  NA 131*  --  134* 131* 131* 132* 131* 135* 138  K 3.8  --  4.4 4.1 4.0 4.3 3.5* 3.9 3.3*  CL 95*  --  98 95* 95* 96 94* 96 99  CO2 20  --  21 19 16* 16* 19 22 23   GLUCOSE 225*  --  121* 134* 126* 221* 350* 481* 157*  BUN 42*  --  48* 51* 53* 62* 74* 83* 93*   CREATININE 2.71*  --  3.08* 3.20* 3.23* 3.20* 2.85* 2.97* 3.09*  CALCIUM 7.4*  < > 7.0* 7.2* 7.4* 7.7* 8.2* 8.5 8.8  PHOS 2.7  --  2.2*  --  2.4 4.7* 4.7*  --  4.5  < > = values in this interval not displayed.  Recent Labs Lab 03/17/14 1948  03/19/14 0400 03/20/14 0244 03/22/14 0339  AST 34  --   --   --   --   ALT 16  --   --   --   --   ALKPHOS 144*  --   --   --   --   BILITOT 1.1  --   --   --   --   PROT 7.0  --   --   --   --   ALBUMIN 2.2*  < > 2.0* 2.1* 1.9*  < > = values in this interval not displayed.  Recent Labs Lab 03/17/14 1948 03/18/14 0348 03/19/14 0400 03/20/14 0244  WBC 8.3 7.5 6.9 8.4  HGB 7.7* 7.3* 7.6* 8.8*  HCT 23.1* 21.7* 22.4* 25.7*  MCV 82.8 82.2 82.7 80.6  PLT 191 181 169 213   No results found for this basename: CKTOTAL, CKMB, CKMBINDEX, TROPONINI,  in the last 168 hours  Recent Labs Lab 03/21/14 1103 03/21/14 1545 03/21/14 1638 03/21/14 2137 03/22/14 0631  GLUCAP 481* 326* 303* 196* 125*      Recent Labs Lab 03/17/14 0541  IRON 21*  TIBC 146*  FERRITIN 858*   Results for CHRISHA, BURFORD (MRN [id number]) as of 03/18/2014 13:58  Ref. Range 03/17/2014 19:48  Haptoglobin Latest Range: 45-215 mg/dL 243 (H)  Results for CHANTEE, MUMMEY (MRN [id number]) as of 03/18/2014 13:58  Ref. Range 03/13/2014 09:10 03/14/2014 18:19 03/17/2014 09:26  Color, Urine Latest Range: YELLOW  YELLOW YELLOW YELLOW  APPearance Latest Range: CLEAR  CLEAR HAZY (A) HAZY (A)  Specific Gravity, Urine Latest Range: 1.005-1.030  >1.030 (H) 1.025 >1.030 (H)  pH Latest Range: 5.0-8.0  5.5 5.5 5.5  Glucose Latest Range: NEGATIVE mg/dL NEGATIVE NEGATIVE NEGATIVE  Bilirubin Urine Latest Range: NEGATIVE  SMALL (A) NEGATIVE NEGATIVE  Ketones, ur Latest Range: NEGATIVE mg/dL NEGATIVE NEGATIVE NEGATIVE  Protein Latest Range: NEGATIVE mg/dL 100 (A) 100 (A) >300 (A)  Urobilinogen, UA Latest Range: 0.0-1.0 mg/dL 0.2 0.2 1.0  Nitrite Latest Range: NEGATIVE  NEGATIVE  NEGATIVE NEGATIVE  Leukocytes, UA Latest Range: NEGATIVE  NEGATIVE NEGATIVE NEGATIVE  Hgb urine dipstick Latest Range: NEGATIVE  TRACE (A) MODERATE (A) MODERATE (A)  WBC, UA Latest Range: <3 WBC/hpf  0-2 7-10  RBC / HPF Latest Range: <3 RBC/hpf 3-6 0-2 3-6  Squamous Epithelial / LPF Latest Range: RARE   MANY (A) MANY (A)  Bacteria, UA Latest Range: RARE  MANY (A) MANY (A) MANY (A)  Casts Latest Range: NEGATIVE   HYALINE CASTS (A)    Medications:   . albuterol  3 mL Inhalation BID  . amLODipine  10 mg Oral QHS  . antiseptic oral rinse  15 mL Mouth Rinse q12n4p  . aspirin EC  81 mg Oral Daily  . atorvastatin  40 mg Oral q1800  . calcitRIOL  0.25 mcg Oral Daily  . chlorhexidine  15 mL Mouth Rinse BID  . darbepoetin (ARANESP) injection - NON-DIALYSIS  200 mcg Subcutaneous Q Sat-1800  . folic acid  1 mg Oral Daily  . furosemide  160 mg Oral BID  . hydroxychloroquine  200 mg Oral BID  . insulin aspart  0-20 Units Subcutaneous TID WC  . insulin aspart  0-5 Units Subcutaneous QHS  . insulin aspart  2 Units Subcutaneous TID WC  . insulin detemir  15 Units Subcutaneous BID  . levofloxacin (LEVAQUIN) IV  500 mg Intravenous Q48H  . metoprolol  50 mg Oral BID  . predniSONE  40 mg Oral Q breakfast  . sodium bicarbonate  1,300 mg Oral TID  . sodium chloride  3 mL Intravenous Q12H      GOLDSBOROUGH,KELLIE A   03/22/2014, 10:38 AM

## 2014-03-23 DIAGNOSIS — E871 Hypo-osmolality and hyponatremia: Secondary | ICD-10-CM | POA: Diagnosis not present

## 2014-03-23 DIAGNOSIS — N179 Acute kidney failure, unspecified: Secondary | ICD-10-CM | POA: Diagnosis not present

## 2014-03-23 DIAGNOSIS — N183 Chronic kidney disease, stage 3 unspecified: Secondary | ICD-10-CM

## 2014-03-23 DIAGNOSIS — J9600 Acute respiratory failure, unspecified whether with hypoxia or hypercapnia: Secondary | ICD-10-CM | POA: Diagnosis not present

## 2014-03-23 LAB — RENAL FUNCTION PANEL
Albumin: 1.9 g/dL — ABNORMAL LOW (ref 3.5–5.2)
BUN: 98 mg/dL — AB (ref 6–23)
CO2: 24 meq/L (ref 19–32)
Calcium: 9.1 mg/dL (ref 8.4–10.5)
Chloride: 97 mEq/L (ref 96–112)
Creatinine, Ser: 3.26 mg/dL — ABNORMAL HIGH (ref 0.50–1.10)
GFR calc Af Amer: 16 mL/min — ABNORMAL LOW (ref >90)
GFR, EST NON AFRICAN AMERICAN: 14 mL/min — AB (ref >90)
Glucose, Bld: 151 mg/dL — ABNORMAL HIGH (ref 70–99)
POTASSIUM: 3.1 meq/L — AB (ref 3.7–5.3)
Phosphorus: 5.1 mg/dL — ABNORMAL HIGH (ref 2.3–4.6)
Sodium: 138 mEq/L (ref 137–147)

## 2014-03-23 LAB — CBC
HCT: 30.1 % — ABNORMAL LOW (ref 36.0–46.0)
Hemoglobin: 10.4 g/dL — ABNORMAL LOW (ref 12.0–15.0)
MCH: 28.3 pg (ref 26.0–34.0)
MCHC: 34.6 g/dL (ref 30.0–36.0)
MCV: 82 fL (ref 78.0–100.0)
Platelets: 143 10*3/uL — ABNORMAL LOW (ref 150–400)
RBC: 3.67 MIL/uL — AB (ref 3.87–5.11)
RDW: 18.1 % — ABNORMAL HIGH (ref 11.5–15.5)
WBC: 9.1 10*3/uL (ref 4.0–10.5)

## 2014-03-23 LAB — GLUCOSE, CAPILLARY
GLUCOSE-CAPILLARY: 134 mg/dL — AB (ref 70–99)
GLUCOSE-CAPILLARY: 231 mg/dL — AB (ref 70–99)
Glucose-Capillary: 98 mg/dL (ref 70–99)
Glucose-Capillary: 124 mg/dL — ABNORMAL HIGH (ref 70–99)
Glucose-Capillary: 174 mg/dL — ABNORMAL HIGH (ref 70–99)

## 2014-03-23 MED ORDER — PREDNISONE 20 MG PO TABS
30.0000 mg | ORAL_TABLET | Freq: Every day | ORAL | Status: DC
  Administered 2014-03-24 – 2014-03-25 (×2): 30 mg via ORAL
  Filled 2014-03-23 (×3): qty 1

## 2014-03-23 MED ORDER — POTASSIUM CHLORIDE CRYS ER 20 MEQ PO TBCR
40.0000 meq | EXTENDED_RELEASE_TABLET | Freq: Once | ORAL | Status: AC
  Administered 2014-03-23: 40 meq via ORAL
  Filled 2014-03-23: qty 2

## 2014-03-23 NOTE — Progress Notes (Signed)
[facility] PROGRESS NOTE Interim History: 68 year old female with history of lupus and chronic kidney disease stage III (baseline creatinine 1.2) and chronic anemia who presents to the emergency room with generalized weakness. She was found to have a hemoglobin of 7.3 and creatinine of 2. She was seen by cardiology and started on intravenous Lasix. Unfortunately, her creatinine has continued to rise. Currently it is 3 and her urine output is now unimpressive. She's been having intermittent fevers as high as 103. Renal workup showed positive ANA, double-stranded DNA as well as positive p-ANCA.Marland Kitchen Biopsy done 6.22.2015.it appear septic and her fevers may relate to her underlying connective tissue disease/vasculitis. Status post biopsy; waiting results and will follow renal recommendations. Concerns for lupus nephritis.  Filed Weights   03/21/14 0643 03/22/14 0534 03/23/14 0637  Weight: 96.253 kg (212 lb 3.2 oz) 95.437 kg (210 lb 6.4 oz) 94.1 kg (207 lb 7.3 oz)        Intake/Output Summary (Last 24 hours) at 03/23/14 0948 Last data filed at 03/23/14 0843  Gross per 24 hour  Intake    240 ml  Output    800 ml  Net   -560 ml     Assessment/Plan: Acute respiratory failure with hypoxia  - Patient given IV fluids for renal failure and transfusions for anemia on admission. Developed worsening sob with hypoxia. Transferred to ICU on BiPap.  - She was started on IV Lasix and initially had diuresis with improvement in her respiratory status.  - Echo with grade 4 diastolic dysfunction, BNP elevated, chest xray consistent with CHF.  - Poor UOP, weight is improving.  Renal failure (ARF), acute on chronicSLE (systemic lupus erythematosus):  - per renal service  - h/o SLE; + ANA and + atypical p-ANCA with proteinuria and hematuria.  - biopsy performed w/o complications' showed FSG. - Continue steroids  therapy  - Avoid NSAIDs/ARB, - Strict intake and output  - Cont lasix, Cr rasing - Plan  is to continue prednisone and plaquenil eventually  - vein mapping consult [facility].  Fever  - Patient has remained afebrile  - continue empirically therapy with levaquin; will treat for 3 more days  - fever may be related to her underlying connective tissue disorder/vasculitis.   Hyponatremia:  -Likely related to #2.  -stable, replete electrolytes.   Anemia in chronic renal disease and Lupus:  - Somewhat symptomatic on presentation with hemoglobin of 7.3. S/p 1 unit PRBC's since admission.  - Will continue now aranesp and IV iron  - Follow Hgb trend and transfuse 1 unit of PRBCs today for better cushion in anticipation of kidney biopsy  - No source of bleeding appreciated.  Essential hypertension, benign:  - Appears controlled. home medications include losartan and HCTZ which are on hold for now (due to worsening kidney function and vol overload)  - Continue her beta blocker and monitor closely.  - Amlodipine added today for better control of BP   Atrial flutter:  - sinus rhythm on telemetry and EKG  - no anticoagulation currently as renal biopsy anticipated on Monday (6/22)  - rate controlled  - will follow cardiology rec's  - Continue beta blocker   Morbid obesity:  - BMI 34.3.  - Encourage to follow low calorie diet and to increase exercise   Acute on hronic diastolic heart failure  -per cardiology  -stable and compensated currently  -continue PO diuretics a current dose  -denies SOB.  Type 2 diabetes mellitus with diabetic neuropathy:  -Hemoglobin A1c  6.9. Will hold her oral agents for now.  -uncontrolled now due to steroids  -anticipate improvement once start tapering  -meanwhile will continue SSI resistant, meal coverage and start levemir BID    Code Status: full  Family Communication: Discussed with husband at the bedside  Disposition Plan: to be determined     Consultants:  Renal  cardiology  Procedures: ECHO: none  Antibiotics:  Cont levaquin for 8  days.  HPI/Subjective: Sad due to biopsy results.  Objective: Filed Vitals:   03/22/14 1900 03/22/14 2050 03/22/14 2100 03/23/14 0637  BP: 138/61  140/64 141/45  Pulse: 73  85 68  Temp: 97.5 F (36.4 C)  97.8 F (36.6 C) 98 F (36.7 C)  TempSrc: Oral  Oral Oral  Resp:   20 20  Height:      Weight:    94.1 kg (207 lb 7.3 oz)  SpO2: 100% 98% 100% 100%     Exam:  General: Alert, awake, oriented x3, in no acute distress.  HEENT: No bruits, no goiter. -JVD, -HJ reflex Heart: Regular rate and rhythm, trace lower ext edema Lungs: Good air movement, clear Abdomen: Soft, nontender, nondistended, positive bowel sounds.     Data Reviewed: Basic Metabolic Panel:  Recent Labs Lab 03/18/14 0348 03/19/14 0400 03/20/14 0244 03/21/14 0724 03/22/14 0339 03/23/14 0337  NA 131* 132* 131* 135* 138 138  K 4.0 4.3 3.5* 3.9 3.3* 3.1*  CL 95* 96 94* 96 99 97  CO2 16* 16* 19 22 23 24   GLUCOSE 126* 221* 350* 481* 157* 151*  BUN 53* 62* 74* 83* 93* 98*  CREATININE 3.23* 3.20* 2.85* 2.97* 3.09* 3.26*  CALCIUM 7.4* 7.7* 8.2* 8.5 8.8 9.1  PHOS 2.4 4.7* 4.7*  --  4.5 5.1*   Liver Function Tests:  Recent Labs Lab 03/17/14 1948 03/18/14 0348 03/19/14 0400 03/20/14 0244 03/22/14 0339 03/23/14 0337  AST 34  --   --   --   --   --   ALT 16  --   --   --   --   --   ALKPHOS 144*  --   --   --   --   --   BILITOT 1.1  --   --   --   --   --   PROT 7.0  --   --   --   --   --   ALBUMIN 2.2* 2.0* 2.0* 2.1* 1.9* 1.9*   No results found for this basename: LIPASE, AMYLASE,  in the last 168 hours No results found for this basename: AMMONIA,  in the last 168 hours CBC:  Recent Labs Lab 03/17/14 1948 03/18/14 0348 03/19/14 0400 03/20/14 0244 03/23/14 0337  WBC 8.3 7.5 6.9 8.4 9.1  HGB 7.7* 7.3* 7.6* 8.8* 10.4*  HCT 23.1* 21.7* 22.4* 25.7* 30.1*  MCV 82.8 82.2 82.7 80.6 82.0  PLT 191 181 169 213 143*   Cardiac Enzymes: No results found for this basename: CKTOTAL, CKMB,  CKMBINDEX, TROPONINI,  in the last 168 hours BNP (last 3 results)  Recent Labs  03/15/14 0551  PROBNP 33778.0*   CBG:  Recent Labs Lab 03/22/14 1144 03/22/14 1620 03/22/14 1657 03/22/14 2120 03/23/14 0657  GLUCAP 112* 198* 206* 226* 98    Recent Results (from the past 240 hour(s))  MRSA PCR SCREENING     Status: None   Collection Time    03/14/14  6:02 PM      Result Value Ref Range Status  MRSA by PCR NEGATIVE  NEGATIVE Final   Comment:            The GeneXpert MRSA Assay (FDA     approved for NASAL specimens     only), is one component of a     comprehensive MRSA colonization     surveillance program. It is not     intended to diagnose MRSA     infection nor to guide or     monitor treatment for     MRSA infections.  URINE CULTURE     Status: None   Collection Time    03/14/14  6:34 PM      Result Value Ref Range Status   Specimen Description URINE, CATHETERIZED   Final   Special Requests IMMUNE:COMPROMISED   Final   Culture  Setup Time     Final   Value: 03/15/2014 14:46     Performed at [place]     Final   Value: NO GROWTH     Performed at [facility]   Culture     Final   Value: NO GROWTH     Performed at [facility]   Report Status 03/16/2014 FINAL   Final  CULTURE, BLOOD (ROUTINE X 2)     Status: None   Collection Time    03/14/14  6:59 PM      Result Value Ref Range Status   Specimen Description BLOOD RIGHT ARM POSSIBLE TRANSFUSION REACTION   Final   Special Requests     Final   Value: BOTTLES DRAWN AEROBIC AND ANAEROBIC AEB=6CC ANA=7CC   Culture NO GROWTH 6 DAYS   Final   Report Status 03/20/2014 FINAL   Final  CULTURE, BLOOD (ROUTINE X 2)     Status: None   Collection Time    03/14/14  7:26 PM      Result Value Ref Range Status   Specimen Description BLOOD LEFT ARM POSSIBLE TRANSFUSION REACTION   Final   Special Requests BOTTLES DRAWN AEROBIC ONLY 4CC   Final   Culture NO GROWTH 6 DAYS   Final     Report Status 03/20/2014 FINAL   Final  CULTURE, BLOOD (ROUTINE X 2)     Status: None   Collection Time    03/17/14  9:11 AM      Result Value Ref Range Status   Specimen Description BLOOD RIGHT HAND   Final   Special Requests BOTTLES DRAWN AEROBIC ONLY 10CC   Final   Culture NO GROWTH 5 DAYS   Final   Report Status 03/22/2014 FINAL   Final  CULTURE, BLOOD (ROUTINE X 2)     Status: None   Collection Time    03/17/14  9:17 AM      Result Value Ref Range Status   Specimen Description BLOOD LEFT HAND   Final   Special Requests BOTTLES DRAWN AEROBIC ONLY 8CC   Final   Culture NO GROWTH 5 DAYS   Final   Report Status 03/22/2014 FINAL   Final     Studies: No results found.  Scheduled Meds: . albuterol  3 mL Inhalation BID  . amLODipine  10 mg Oral QHS  . antiseptic oral rinse  15 mL Mouth Rinse q12n4p  . aspirin EC  81 mg Oral Daily  . atorvastatin  40 mg Oral q1800  . calcitRIOL  0.25 mcg Oral Daily  . chlorhexidine  15 mL Mouth Rinse BID  .  darbepoetin (ARANESP) injection - NON-DIALYSIS  200 mcg Subcutaneous Q Sat-1800  . folic acid  1 mg Oral Daily  . furosemide  160 mg Oral BID  . hydroxychloroquine  200 mg Oral BID  . insulin aspart  0-20 Units Subcutaneous TID WC  . insulin aspart  0-5 Units Subcutaneous QHS  . insulin aspart  2 Units Subcutaneous TID WC  . metoprolol  50 mg Oral BID  . predniSONE  40 mg Oral Q breakfast  . sodium bicarbonate  1,300 mg Oral TID  . sodium chloride  3 mL Intravenous Q12H   Continuous Infusions:    [name]  [facility] Pager [phone] If 8PM-8AM, please contact night-coverage at [email or web address], password [facility] 03/23/2014, 9:48 AM  LOS: 10 days     **Disclaimer: This note may have been dictated with voice recognition software. Similar sounding words can inadvertently be transcribed and this note may contain transcription errors which may not have been corrected upon publication of note.**

## 2014-03-23 NOTE — Consult Note (Signed)
[facility] [place]  Reason for Consult:  In need on permanent HD access Referring Physician:  Moshe Cipro MRN #:  [id number]  History of Present Illness: This is a 68 y.o. female with hx of lupus and is followed every 3 months by Dr. Amil Amen.  She takes Plaquenil, Prednisone, and Methotrexate.  She presented to the hospital with complaints of generalized weakness with feeling bad all over.  She has also been having increasing DOE.  Her labs in the ED revealed a creatinine of 2.41 and hgb of 7.9  She was admitted to the hospital and recently underwent renal bx, which revealed collapsing FSG with severe arteriosclerosis and moderate to severe tubulointerstitial scarring-this indicates that there is not much chance for reversibility and further immune suppression is likely not helpful.  [facility] is consulted for permanent HD access.  She has had fevers, which they believe may be related to her underlying connective tissue disease/vascultitis.    She does have a hx of atrial flutter and is currently in sinus rhythm.  She is s/p TEE cardioversion in December 2013 and has maintained NSR.   She does have acute on chronic diastolic CHF.  Recent echo revealed LV EF of 50-55%, moderate AI, mild MR, and mod-severe TR.  She also has a hx of DM.  She is on a statin for her cholesterol.  She is on a beta blocker for HTN.  She was on an ARB, but this has been discontinued.  Past Medical History  Diagnosis Date  . Asthma   . Essential hypertension, benign   . Type 2 diabetes mellitus   . SLE (systemic lupus erythematosus)   . Anemia   . Atrial flutter     TEE/DCCV December 2013 - on Xarelto [facility])  . CKD (chronic kidney disease) stage 3, GFR 30-59 ml/min    Past Surgical History  Procedure Laterality Date  . Ankle surgery  1993  . Back surgery  1980  . Abdominal hysterectomy  1983  . Tee without cardioversion  09/16/2012    Procedure: TRANSESOPHAGEAL ECHOCARDIOGRAM (TEE);   Surgeon: Sanda Klein, MD;  Location: [place];  Service: Cardiovascular;  Laterality: N/A;  . Cardioversion  09/16/2012    Procedure: CARDIOVERSION;  Surgeon: Sanda Klein, MD;  Location: [facility] ENDOSCOPY;  Service: Cardiovascular;  Laterality: N/A;  . Esophagogastroduodenoscopy  09/20/2012    Procedure: ESOPHAGOGASTRODUODENOSCOPY (EGD);  Surgeon: Cleotis Nipper, MD;  Location: [facility] ENDOSCOPY;  Service: Endoscopy;  Laterality: Left;  . Colonoscopy  09/20/2012    Procedure: COLONOSCOPY;  Surgeon: Cleotis Nipper, MD;  Location: [facility] ENDOSCOPY;  Service: Endoscopy;  Laterality: Left;    No Known Allergies  Prior to Admission medications   Medication Sig Start Date End Date Taking? Authorizing Provider  acetaminophen (TYLENOL) 500 MG tablet Take 1,000 mg by mouth every 6 (six) hours as needed. For pain   Yes Historical Provider, MD  albuterol (PROVENTIL HFA;VENTOLIN HFA) 108 (90 BASE) MCG/ACT inhaler Inhale 2 puffs into the lungs every 6 (six) hours as needed. For shortness of breath   Yes Historical Provider, MD  aspirin EC 81 MG tablet Take 81 mg by mouth daily.   Yes Historical Provider, MD  folic acid (FOLVITE) 1 MG tablet Take 1 mg by mouth daily.   Yes Historical Provider, MD  furosemide (LASIX) 20 MG tablet Take 40 mg by mouth 3 (three) times daily as needed for fluid.  09/20/12  Yes Luke K Kilroy, PA-C  hydroxychloroquine (PLAQUENIL) 200 MG tablet  Take 200 mg by mouth 2 (two) times daily.   Yes Historical Provider, MD  losartan-hydrochlorothiazide (HYZAAR) 100-25 MG per tablet Take 1 tablet by mouth daily.   Yes Historical Provider, MD  meloxicam (MOBIC) 7.5 MG tablet Take 7.5 mg by mouth 2 (two) times daily.   Yes Historical Provider, MD  metFORMIN (GLUCOPHAGE) 500 MG tablet Take by mouth 2 (two) times daily with a meal.   Yes Historical Provider, MD  methotrexate (RHEUMATREX) 2.5 MG tablet Take 10 mg by mouth once a week. Caution:Chemotherapy. Protect from light.   Yes Historical  Provider, MD  metoprolol (LOPRESSOR) 50 MG tablet Take 1 tablet (50 mg total) by mouth 2 (two) times daily. 04/18/13  Yes Mihai Croitoru, MD  Multiple Vitamins-Minerals (CENTRUM SILVER PO) Take 1 tablet by mouth daily.   Yes Historical Provider, MD  predniSONE (DELTASONE) 2.5 MG tablet Take 2.5 mg by mouth daily with breakfast.   Yes Historical Provider, MD  saxagliptin HCl (ONGLYZA) 2.5 MG TABS tablet Take 2.5 mg by mouth daily.   Yes Historical Provider, MD  simvastatin (ZOCOR) 80 MG tablet Take 80 mg by mouth daily.   Yes Historical Provider, MD  Vitamin D, Ergocalciferol, (DRISDOL) 50000 UNITS CAPS capsule Take 50,000 Units by mouth every 7 (seven) days. Tuesday.   Yes Historical Provider, MD    History   Social History  . Marital Status: Married    Spouse Name: N/A    Number of Children: N/A  . Years of Education: N/A   Occupational History  . Not on file.   Social History Main Topics  . Smoking status: Former Smoker -- 1.00 packs/day for 30 years    Types: Cigarettes  . Smokeless tobacco: Not on file     Comment: quit 08-2012  . Alcohol Use: No  . Drug Use: No  . Sexual Activity: Yes   Other Topics Concern  . Not on file   Social History Narrative  . No narrative on file     Family History  Problem Relation Age of Onset  . Diabetes Sister   . Diabetes Brother   . Diabetes Father   . Hypertension Father   . Hypertension Sister   . Hypertension Brother   . Stroke Mother     ROS: [x]  Positive   [ ]  Negative   [ ]  All sytems reviewed and are negative  Cardiovascular: []  chest pain/pressure []  palpitations []  SOB lying flat [x]  DOE []  pain in legs while walking []  pain in legs at rest []  pain in legs at night []  non-healing ulcers [x]  swelling in legs [x]  hx irregular heart beat with cardioversion  Pulmonary: []  productive cough []  asthma/wheezing []  home O2 [x]  hx asthma  Neurologic: []  weakness in []  arms []  legs []  numbness in []  arms []   legs []  hx of CVA []  mini stroke [] difficulty speaking or slurred speech []  temporary loss of vision in one eye []  dizziness  Hematologic: []  hx of cancer []  bleeding problems []  problems with blood clotting easily  Endocrine:   [x]  diabetes []  thyroid disease  GI []  vomiting blood []  blood in stool  GU: [x]  CKD/renal failure []  HD--[]  M/W/F or []  T/T/S []  burning with urination []  blood in urine  Psychiatric: []  anxiety []  depression  Musculoskeletal: []  arthritis []  joint pain  Integumentary: []  rashes []  ulcers  Constitutional: []  fever []  chills [x]  general weakness   Physical Examination  Filed Vitals:   03/23/14 0637  BP: 141/45  Pulse: 68  Temp: 98 F (36.7 C)  Resp: 20   Body mass index is 34.52 kg/(m^2).  General:  WDWN in NAD Gait: Not observed HENT: WNL, normocephalic Pulmonary: normal non-labored breathing, without Rales, rhonchi,  wheezing Cardiac: regular, without  Murmurs, rubs or gallops; without carotid bruits Abdomen: soft, NT/ND, no masses Skin: without rashes, without ulcers  Vascular Exam/Pulses:  Right Left  Radial 2+ (normal) 2+ (normal)  Ulnar 1+ (weak) 1+ (weak)  Popliteal Unable to palpate Unable to palpate  DP 1+ (weak) 2+ (normal)  PT Unable to palpate Unable to palpate   Extremities: without ischemic changes, without Gangrene , without cellulitis; without open wounds; + edema BLE Musculoskeletal: no muscle wasting or atrophy  Neurologic: A&O X 3; Appropriate Affect ; SENSATION: normal; MOTOR FUNCTION:  moving all extremities equally. Speech is slow   CBC    Component Value Date/Time   WBC 9.1 03/23/2014 0337   RBC 3.67* 03/23/2014 0337   RBC 2.63* 02/16/2014 1400   HGB 10.4* 03/23/2014 0337   HCT 30.1* 03/23/2014 0337   PLT 143* 03/23/2014 0337   MCV 82.0 03/23/2014 0337   MCH 28.3 03/23/2014 0337   MCHC 34.6 03/23/2014 0337   RDW 18.1* 03/23/2014 0337   LYMPHSABS 1.1 03/15/2014 0551   MONOABS 1.2* 03/15/2014 0551    EOSABS 0.0 03/15/2014 0551   BASOSABS 0.0 03/15/2014 0551    BMET    Component Value Date/Time   NA 138 03/23/2014 0337   K 3.1* 03/23/2014 0337   CL 97 03/23/2014 0337   CO2 24 03/23/2014 0337   GLUCOSE 151* 03/23/2014 0337   BUN 98* 03/23/2014 0337   CREATININE 3.26* 03/23/2014 0337   CALCIUM 9.1 03/23/2014 0337   CALCIUM 7.0* 03/16/2014 0835   GFRNONAA 14* 03/23/2014 0337   GFRAA 16* 03/23/2014 0337    COAGS: Lab Results  Component Value Date   INR 1.15 03/19/2014     Non-Invasive Vascular Imaging:  Vein mapping pending  Statin:  yes Beta Blocker:  yes Aspirin:  yes ACEI:  no ARB:  yes-was on this prior to admission-has not been reordered Other antiplatelets/anticoagulants:  no   ASSESSMENT: This is a 68 y.o. female with hx of lupus and now with acute kidney injury in need of permanent HD access  PLAN: -BUE vein mapping has been ordered.  Will plan for access on Monday.  The renal service will let us know if she needs diatek catheter placement also at that time.   Leontine Locket, PA-C [facility] [phone]  I agree with the above.  I have seen and examined the patient and reviewed her vein map.  She has a right brachial vein DVT.  She is left handed.  She does not have a adequate right arm vein for a fistula, and with the right arm DVT, I would not recommend a right arm access.  She has a palpable left brachial pulse.  VEin map reveals possible an adequate bascilic vein.  I discussed proceeding with a right arm bascilic vein fistula.  She understands that she will likely require additional proceedures for full maturation of her fistula.  I also discussed that she may require a graft if a fistula does not mature.  This will tentatively be scheduled for Monday.  She will need to have the IV in her left arm removed.  I discussed this with the nurse.  Annamarie Major

## 2014-03-23 NOTE — Progress Notes (Addendum)
Patient Name: [name] Date of Encounter: 03/23/2014     Principal Problem:   Renal failure (ARF), acute on chronic Active Problems:   Atrial flutter   Morbid obesity   Type 2 diabetes mellitus with diabetic neuropathy   SLE (systemic lupus erythematosus)   Essential hypertension, benign   COPD (chronic obstructive pulmonary disease)   Anemia in chronic renal disease   Dehydration   Hyponatremia   Acute on chronic renal failure   Dyspnea   Diastolic CHF, acute on chronic   Acute respiratory failure with hypoxia   Fever, unspecified   Acute on chronic diastolic congestive heart failure    SUBJECTIVE   Patient less SOB and was able to sleep last night on her usual incline. No PND. She reports a mild cough x1-2 days    CURRENT MEDS . albuterol  3 mL Inhalation BID  . amLODipine  10 mg Oral QHS  . antiseptic oral rinse  15 mL Mouth Rinse q12n4p  . aspirin EC  81 mg Oral Daily  . atorvastatin  40 mg Oral q1800  . calcitRIOL  0.25 mcg Oral Daily  . chlorhexidine  15 mL Mouth Rinse BID  . darbepoetin (ARANESP) injection - NON-DIALYSIS  200 mcg Subcutaneous Q Sat-1800  . folic acid  1 mg Oral Daily  . furosemide  160 mg Oral BID  . hydroxychloroquine  200 mg Oral BID  . insulin aspart  0-20 Units Subcutaneous TID WC  . insulin aspart  0-5 Units Subcutaneous QHS  . insulin aspart  2 Units Subcutaneous TID WC  . insulin detemir  15 Units Subcutaneous BID  . metoprolol  50 mg Oral BID  . predniSONE  40 mg Oral Q breakfast  . sodium bicarbonate  1,300 mg Oral TID  . sodium chloride  3 mL Intravenous Q12H    OBJECTIVE  Filed Vitals:   03/22/14 1900 03/22/14 2050 03/22/14 2100 03/23/14 0637  BP: 138/61  140/64 141/45  Pulse: 73  85 68  Temp: 97.5 F (36.4 C)  97.8 F (36.6 C) 98 F (36.7 C)  TempSrc: Oral  Oral Oral  Resp:   20 20  Height:      Weight:    207 lb 7.3 oz (94.1 kg)  SpO2: 100% 98% 100% 100%    Intake/Output Summary (Last 24 hours) at  03/23/14 0844 Last data filed at 03/23/14 0843  Gross per 24 hour  Intake    240 ml  Output    800 ml  Net   -560 ml   Filed Weights   03/21/14 0643 03/22/14 0534 03/23/14 0637  Weight: 212 lb 3.2 oz (96.253 kg) 210 lb 6.4 oz (95.437 kg) 207 lb 7.3 oz (94.1 kg)    PHYSICAL EXAM  General: Pleasant, NAD. Neuro: Alert and oriented X 3. Moves all extremities spontaneously. Psych: Normal affect. HEENT:  Normal  Neck: Supple without bruits + JVD. Lungs:  Resp regular and unlabored, crackles at bases. Mild cough.   Heart: RRR no s3, s4, ++ murmur Abdomen: Soft, non-tender, non-distended, BS + x 4.  Extremities: No clubbing, cyanosis 1+ edema. Marland Kitchen DP/PT/Radials 2+ and equal bilaterally.  Accessory Clinical Findings  CBC  Recent Labs  03/23/14 0337  WBC 9.1  HGB 10.4*  HCT 30.1*  MCV 82.0  PLT [id number]*   Basic Metabolic Panel  Recent Labs  03/22/14 0339 03/23/14 0337  NA 138 138  K 3.3* 3.1*  CL 99 97  CO2 23 24  GLUCOSE 157* 151*  BUN 93* 98*  CREATININE 3.09* 3.26*  CALCIUM 8.8 9.1  PHOS 4.5 5.1*   Liver Function Tests  Recent Labs  03/22/14 0339 03/23/14 0337  ALBUMIN 1.9* 1.9*    TELE  Not on tele  Radiology/Studies  Dg Chest 2 View  03/16/2014   CLINICAL DATA:  Fever.  EXAM: CHEST  2 VIEW  COMPARISON:  March 15, 2014.  FINDINGS: Stable cardiomegaly. No pneumothorax or pleural effusion is noted. No acute pulmonary disease is noted. Bony thorax appears intact  IMPRESSION: No acute cardiopulmonary abnormality seen.   Electronically Signed   By: Sabino Dick M.D.   On: 03/16/2014 12:16   Dg Chest 2 View  03/13/2014   CLINICAL DATA:  Weakness.  End-stage renal disease.  EXAM: CHEST  2 VIEW  COMPARISON:  09/04/2012  FINDINGS: The heart is enlarged but stable. Stable tortuosity and calcification of the thoracic aorta. Mild vascular congestion but no overt pulmonary edema. No pleural effusions or focal infiltrates. The bony thorax is intact.  IMPRESSION: Stable  cardiac enlargement.  Mild vascular congestion but no infiltrates or effusions.   Electronically Signed   By: Kalman Jewels M.D.   On: 03/13/2014 09:55   US Renal  03/15/2014   CLINICAL DATA:  Assess for renal size, hypertension.  EXAM: RENAL/URINARY TRACT ULTRASOUND COMPLETE  COMPARISON:  CT abdomen and pelvis January 11, 2004  FINDINGS: Right Kidney:  Length: 11.9 cm. Echogenicity within normal limits. No hydronephrosis visualized. There is a 3.2 x 2.9 x 3 cm anechoic cyst in the upper pole right kidney.  Left Kidney:  Length: 13.2 cm. Echogenicity within normal limits. No mass visualized. There is dilated calyx in the midpole left kidney. There is no hydronephrosis.  Bladder:  Not visualized due to overlying bowel gas and patient's body habitus per ultrasound technologist.  IMPRESSION: Bilateral normal renal size. Focal dilated calyx in the midpole left kidney without hydronephrosis.   Electronically Signed   By: Abelardo Diesel M.D.   On: 03/15/2014 17:34   US Carotid Duplex Bilateral  02/21/2014   CLINICAL DATA:  Carotid bruit, history of hypertension, hyperlipidemia, diabetes  EXAM: BILATERAL CAROTID DUPLEX ULTRASOUND  TECHNIQUE: Pearline Cables scale imaging, color Doppler and duplex ultrasound were performed of bilateral carotid and vertebral arteries in the neck.  COMPARISON:  None.  FINDINGS: Criteria: Quantification of carotid stenosis is based on velocity parameters that correlate the residual internal carotid diameter with NASCET-based stenosis levels, using the diameter of the distal internal carotid lumen as the denominator for stenosis measurement.  The following velocity measurements were obtained:  RIGHT  ICA:  96/17 cm/sec  CCA:  [id number] cm/sec  SYSTOLIC ICA/CCA RATIO:  [id number]  DIASTOLIC ICA/CCA RATIO:  2.3  ECA:  80 cm/sec  LEFT  ICA:  85/20 cm/sec  CCA:  [id number] cm/sec  SYSTOLIC ICA/CCA RATIO:  0.9  DIASTOLIC ICA/CCA RATIO:  1.4  ECA:  77 cm/sec  RIGHT CAROTID ARTERY: There is a minimal amount of eccentric  mixed echogenic plaque within the mid and distal aspects of the right common carotid artery (images 6, 7, 10 and 11). There is a minimal amount of eccentric mixed echogenic plaque within the right carotid bulb and (images 16), not resulting in elevated peak systolic velocities within the interrogated course of the right internal carotid artery to suggest a hemodynamically significant stenosis.  RIGHT VERTEBRAL ARTERY:  Antegrade flow  LEFT CAROTID ARTERY: There is a minimal amount of eccentric mixed echogenic plaque within the  mid and distal aspects of the left common carotid artery (images 41, 42, 45 and 46). There is a very minimal amount of eccentric mixed echogenic partially shadowing plaque with left carotid bulb (image 51) extending to involve the origin and proximal aspect of the left internal carotid artery (image 59), not resulting in elevated peak systolic velocities within the interrogated course of the left internal carotid artery to suggest a hemodynamically significant stenosis.  LEFT VERTEBRAL ARTERY:  Antegrade flow  IMPRESSION: Minimal amount of bilateral atherosclerotic plaque, not resulting in a hemodynamically significant stenosis.   Electronically Signed   By: Sandi Mariscal M.D.   On: 02/21/2014 10:39   Dg Chest Port 1 View  03/15/2014   CLINICAL DATA:  Short of breath.  EXAM: PORTABLE CHEST - 1 VIEW  COMPARISON:  03/14/2014  FINDINGS: Mild enlargement of the cardiopericardial silhouette, stable. Central vascular congestion and hazy perihilar airspace opacity as well as central interstitial thickening is stable consistent with congestive heart failure and pulmonary edema.  No pneumothorax.  IMPRESSION: No change from the prior study. Findings remain consistent with mild congestive heart failure.   Electronically Signed   By: Lajean Manes M.D.   On: 03/15/2014 10:16   Dg Chest Port 1 View  03/14/2014   CLINICAL DATA:  Shortness of breath.  EXAM: PORTABLE CHEST - 1 VIEW  COMPARISON:   03/13/2014  FINDINGS: Chronic cardiopericardial enlargement. There is vascular pedicle widening. There is newly cephalized blood flow and interstitial indistinctness. No asymmetric opacity, definitive effusion, or pneumothorax.  IMPRESSION: CHF.   Electronically Signed   By: Jorje Guild M.D.   On: 03/14/2014 17:55   2Decho 03/14/14:Study Conclusions - Left ventricle: The cavity size was normal. Wall thickness was increased increased in a pattern of mild to moderate LVH. Systolic function was normal. The estimated ejection fraction was in the range of 50% to 55%. Doppler parameters are consistent with an irreversible restrictive pattern, indicative of decreased left ventricular diastolic compliance and/or increased left atrial pressure (grade 4 diastolic dysfunction). - Aortic valve: Mildly calcified annulus. Trileaflet; mildly thickened leaflets. There was moderate regurgitation. Regurgitation pressure half-time: 324 ms. - Aortic root: The aortic root was normal in size. - Mitral valve: Mildly calcified annulus. Moderately thickened leaflets . There was mild regurgitation. - Left atrium: The atrium was moderately dilated. - Pulmonary veins: There is systolic blunting of pulmonary vein flow suggestive of elevated LA pressure. - Right ventricle: The cavity size was mildly to moderately dilated. - Right atrium: The atrium was moderately dilated. - Tricuspid valve: There was moderate-severe regurgitation. The jet is eccentric and posterior, this may lead to underestimation of severity. The TR vena contracta measures 0.4 cm. There is systolic reversal of hepatic vein flow suggestive of severe TR. The morphological etiology of the TR is unclear from available images. - Pulmonary arteries: Systolic pressure was moderately increased. PA peak pressure: 61 mm Hg (S). Pulmonary pressure may be underestimated in the setting of significant TR. - Inferior vena cava: The vessel was dilated. The  respirophasic diameter changes were blunted (< 50%), consistent with elevated central venous pressure. - Technically adequate study.   ASSESSMENT AND PLAN  68 yo female hx of DM2, HTN, COPD, anemia, CKD stage III, and aflutter who was admitted on 03/13/14 with generalized weakness and SOB. She had multiple metabolic abnormalities including hyponatremia with Na 129, AKI on CKD, and anemia with Hgb of 7.9. She was thought to be dehydrated and received IVF and was also transfused  pRBCs. On 03/14/14 patient developed worsening SOB requiring transfer to ICU and being placed on Bipap. CXR showed pulmonary edema. She was given IV lasix with improvement of symptoms and now is weaned off bipap.   Acute on Chronic Diastolic CHF-- initially presented hypovolemic, in setting of IVF and blood transfusions became fairly acutely volume overloaded requiring transfer to ICU with Bipap and diuresis. BNP >33K -- 2D echo 03/14/14 shows normal LV function EF 50-55% with mild to mod LVH, Grade 4 DD, mod AI, mild MR, mod-severe TR, PA peak pressure 61 mm Hg.   Her creatinine was 1.2 in May 2015.  She has acute renal insufficiency.  I think this is the primary reason for her decompensation - not acute worsening of her diastolic dysfunction.  -- She is now on PO Lasix 160  BID. Would appreciate nephrology's input   She has had fevers.  I would favor doing cath as OP once she is better.   I have talked to Dr. Aileen Fass.   We will sign off.      Atrial Flutter: status post TEE cardioversion in December 2013, maintaining NSR   -- Continue Lopressor 50mg  BID.  -- Previously on Xarelto but this was discontinued. Dr. Domenic Polite not certain when this dropped off her list. She is on aspirin. She has had no obvious recurrences, and is in sinus rhythm now. Patient decided not to resume.   Pulmonary HTN: PASP of 61 mmHg. Likely related to significant left sided diastolic dysfunction. Her PASP may be underestimated by echo due  to her significant TR. In the future may consider RHC to further evaluate her pulmonary HTN, her history of lupus does put her at risk for primary arterial pulmonary htn as well, a RHC would help distinguish this, and will be considered at future follow ups (per Dr. Harl Bowie).  -- Plan right heart cath. However, per Dr. Nelly Laurence note this might not be done this admission.  Hypertension: relatively well controlled. Continue Norvasc 10mg  and metoprolol 50mg  BID  -- Hyzaar D/Cd due to AKI  Lupus - continue Plaquenil and prednisone. Methotrexate held.  Anemia of chronic disease secondary to Lupus and CKD. Severe.  -- Continue ESA as aranesp 200/week and give IV feraheme to treat fe deficiency. Haptoglobin normal.  -- Transfused a unit yesterday for Hb 7.6 for a little bigger cushion for biopsy. Hg 8.8 s/p transfusion  Acute on chronic CKD: (baseline creatinine 1.2-1.4)   -- Nephrology is following and biopsy done yesterday. -- Avoiding ACE/ARB. Methotrexate held. Pulsing steroids. Creat is stable. Currently 2.97, which is slightly higher than yesterday   Diabetes mellitus, type II  We will sign off.  Call for questions.    Thayer Headings, Brooke Bonito., MD, [facility] 03/23/2014, 8:44 AM [address],  [place] Pager [phone]

## 2014-03-23 NOTE — Progress Notes (Signed)
Background:  68 yo AAF with SLE ( on chronic pred/plaquenil and methotrexate, followed by Dr. Amil Little), DM, HTN, diastolic dysfunction and severe anemia, with acute kidney injury on chronic (baseline creatinine 1.2-1.4- was 1.2 on 02/16/14) .  S/p renal biopsy which is showing collapsing FSG- details below Impression/Plan  1. Acute kidney injury - h/o SLE; + ANA and + atypical p-ANCA with proteinuria and hematuria. S/p renal biopsy which showed collapsing FSG with severe arteriosclerosis and moderate to severe tubulointerstitial scarring.  This would indicate that there is not much chance for reversibility and that further immune supression is likely not helpful.  Continue plaquenil. Continue to hold methotrexate for right now.  Will wean prednisone. Creatinine worsening daily - advanced CKD- We likely just need to make arrangements to initiate chronic HD. No uremic symptoms or indications for dialysis.  My plan is to get vien mapping, call [facility] and plan for AVF on Monday- we should know by then if she will need a PC and initiation of dialysis this hospitalization by then 2. Anemia - severe.  Was to have started procrit 40,000 once per week but had received only one dose PTA. Continue ESA as aranesp 200/week and give IV feraheme to treat fe deficiency. Haptoglobin normal. Transfused a unit Sunday.  Is better 3. S/p acute on chronic diastolic CHF - stopped IVF. Changed lasix to po. Volume stable but is heavy. Increased to 160 BID to keep her out of trouble from a CHF point of view but dont think she is dry given blood pressure- UOP not all recorded but weight is going down 4. SLE - continue pred/plaquenil. Continue to hold MTX for now.  Would recommend getting Dr. Amil Little involved for historical aspects of her lupus 5. H/o atrial flutter - SR at present 6. DM2 - sliding scale insulin; pulse steroids have affected DM control adversely- per primary 7. HTN - avoid ACE or ARB at this time; on BB. Marland Kitchen  amlodipine 10  at hs - BP is reasonable- may improve further with volume control 8. Fevers - fevers at [facility]. On empiric levaquin, presently afebrile 9. Hyperparathyroidism - calcitriol 0.25/day 10. Metabolic acidosis - started po bicarb. Increase to 2 tid- is better  Subjective:  Very pleasant- does not seem to be having uremic symptoms.  I informed both she and her husband regarding results of biopsy need for dialysis in the future but not sure when    Objective Vital signs in last 24 hours: Filed Vitals:   03/22/14 1900 03/22/14 2050 03/22/14 2100 03/23/14 0637  BP: 138/61  140/64 141/45  Pulse: 73  85 68  Temp: 97.5 F (36.4 C)  97.8 F (36.6 C) 98 F (36.7 C)  TempSrc: Oral  Oral Oral  Resp:   20 20  Height:      Weight:    94.1 kg (207 lb 7.3 oz)  SpO2: 100% 98% 100% 100%   Weight change: -1.337 kg (-2 lb 15.2 oz)  Intake/Output Summary (Last 24 hours) at 03/23/14 0905 Last data filed at 03/23/14 0843  Gross per 24 hour  Intake    240 ml  Output    800 ml  Net   -560 ml   Physical Exam:  BP 141/45  Pulse 68  Temp(Src) 98 F (36.7 C) (Oral)  Resp 20  Ht 5\' 5"  (1.651 m)  Wt 94.1 kg (207 lb 7.3 oz)  BMI 34.52 kg/m2  SpO2 100% Gen: Pleasant BF NAD  Up in the chair Skin: no  rash, cyanosis but has chronic darkening and skin changes LE's  Neck: no JVD  Chest: Lung fields clear  Heart: regular, no rub or gallop S1S2 No S3 2/6 murmur aortic area/LSB unchanged  No diastolic murmur or rub  Abdomen: soft, obese, no focal tenderness  Ext: some edema of the LE's.  Neuro: alert, Ox3, no focal deficit- a little slow  Recent Labs Lab 03/17/14 0541 03/17/14 1948 03/18/14 0348 03/19/14 0400 03/20/14 0244 03/21/14 0724 03/22/14 0339 03/23/14 0337  NA 134* 131* 131* 132* 131* 135* 138 138  K 4.4 4.1 4.0 4.3 3.5* 3.9 3.3* 3.1*  CL 98 95* 95* 96 94* 96 99 97  CO2 21 19 16* 16* 19 22 23 24   GLUCOSE 121* 134* 126* 221* 350* 481* 157* 151*  BUN 48* 51* 53* 62* 74* 83* 93* 98*   CREATININE 3.08* 3.20* 3.23* 3.20* 2.85* 2.97* 3.09* 3.26*  CALCIUM 7.0* 7.2* 7.4* 7.7* 8.2* 8.5 8.8 9.1  PHOS 2.2*  --  2.4 4.7* 4.7*  --  4.5 5.1*    Recent Labs Lab 03/17/14 1948  03/20/14 0244 03/22/14 0339 03/23/14 0337  AST 34  --   --   --   --   ALT 16  --   --   --   --   ALKPHOS 144*  --   --   --   --   BILITOT 1.1  --   --   --   --   PROT 7.0  --   --   --   --   ALBUMIN 2.2*  < > 2.1* 1.9* 1.9*  < > = values in this interval not displayed.  Recent Labs Lab 03/18/14 0348 03/19/14 0400 03/20/14 0244 03/23/14 0337  WBC 7.5 6.9 8.4 9.1  HGB 7.3* 7.6* 8.8* 10.4*  HCT 21.7* 22.4* 25.7* 30.1*  MCV 82.2 82.7 80.6 82.0  PLT 181 169 213 143*   No results found for this basename: CKTOTAL, CKMB, CKMBINDEX, TROPONINI,  in the last 168 hours  Recent Labs Lab 03/22/14 1144 03/22/14 1620 03/22/14 1657 03/22/14 2120 03/23/14 0657  GLUCAP 112* 198* 206* 226* 98      Recent Labs Lab 03/17/14 0541  IRON 21*  TIBC 146*  FERRITIN 858*   Results for Dana, Little (MRN [id number]) as of 03/18/2014 13:58  Ref. Range 03/17/2014 19:48  Haptoglobin Latest Range: 45-215 mg/dL 243 (H)  Results for Dana, Little (MRN [id number]) as of 03/18/2014 13:58  Ref. Range 03/13/2014 09:10 03/14/2014 18:19 03/17/2014 09:26  Color, Urine Latest Range: YELLOW  YELLOW YELLOW YELLOW  APPearance Latest Range: CLEAR  CLEAR HAZY (A) HAZY (A)  Specific Gravity, Urine Latest Range: 1.005-1.030  >1.030 (H) 1.025 >1.030 (H)  pH Latest Range: 5.0-8.0  5.5 5.5 5.5  Glucose Latest Range: NEGATIVE mg/dL NEGATIVE NEGATIVE NEGATIVE  Bilirubin Urine Latest Range: NEGATIVE  SMALL (A) NEGATIVE NEGATIVE  Ketones, ur Latest Range: NEGATIVE mg/dL NEGATIVE NEGATIVE NEGATIVE  Protein Latest Range: NEGATIVE mg/dL 100 (A) 100 (A) >300 (A)  Urobilinogen, UA Latest Range: 0.0-1.0 mg/dL 0.2 0.2 1.0  Nitrite Latest Range: NEGATIVE  NEGATIVE NEGATIVE NEGATIVE  Leukocytes, UA Latest Range: NEGATIVE   NEGATIVE NEGATIVE NEGATIVE  Hgb urine dipstick Latest Range: NEGATIVE  TRACE (A) MODERATE (A) MODERATE (A)  WBC, UA Latest Range: <3 WBC/hpf  0-2 7-10  RBC / HPF Latest Range: <3 RBC/hpf 3-6 0-2 3-6  Squamous Epithelial / LPF Latest Range: RARE   MANY (A) MANY (A)  Bacteria, UA Latest Range: RARE  MANY (A) MANY (A) MANY (A)  Casts Latest Range: NEGATIVE   HYALINE CASTS (A)    Medications:   . albuterol  3 mL Inhalation BID  . amLODipine  10 mg Oral QHS  . antiseptic oral rinse  15 mL Mouth Rinse q12n4p  . aspirin EC  81 mg Oral Daily  . atorvastatin  40 mg Oral q1800  . calcitRIOL  0.25 mcg Oral Daily  . chlorhexidine  15 mL Mouth Rinse BID  . darbepoetin (ARANESP) injection - NON-DIALYSIS  200 mcg Subcutaneous Q Sat-1800  . folic acid  1 mg Oral Daily  . furosemide  160 mg Oral BID  . hydroxychloroquine  200 mg Oral BID  . insulin aspart  0-20 Units Subcutaneous TID WC  . insulin aspart  0-5 Units Subcutaneous QHS  . insulin aspart  2 Units Subcutaneous TID WC  . insulin detemir  15 Units Subcutaneous BID  . metoprolol  50 mg Oral BID  . predniSONE  40 mg Oral Q breakfast  . sodium bicarbonate  1,300 mg Oral TID  . sodium chloride  3 mL Intravenous Q12H      [name] A   03/23/2014, 9:05 AM

## 2014-03-24 DIAGNOSIS — N19 Unspecified kidney failure: Secondary | ICD-10-CM

## 2014-03-24 DIAGNOSIS — I10 Essential (primary) hypertension: Secondary | ICD-10-CM | POA: Diagnosis not present

## 2014-03-24 DIAGNOSIS — J9600 Acute respiratory failure, unspecified whether with hypoxia or hypercapnia: Secondary | ICD-10-CM | POA: Diagnosis not present

## 2014-03-24 DIAGNOSIS — E871 Hypo-osmolality and hyponatremia: Secondary | ICD-10-CM | POA: Diagnosis not present

## 2014-03-24 DIAGNOSIS — N183 Chronic kidney disease, stage 3 unspecified: Secondary | ICD-10-CM | POA: Diagnosis not present

## 2014-03-24 LAB — RENAL FUNCTION PANEL
Albumin: 2 g/dL — ABNORMAL LOW (ref 3.5–5.2)
BUN: 107 mg/dL — ABNORMAL HIGH (ref 6–23)
CHLORIDE: 95 meq/L — AB (ref 96–112)
CO2: 23 meq/L (ref 19–32)
Calcium: 9 mg/dL (ref 8.4–10.5)
Creatinine, Ser: 3.17 mg/dL — ABNORMAL HIGH (ref 0.50–1.10)
GFR calc Af Amer: 16 mL/min — ABNORMAL LOW (ref >90)
GFR, EST NON AFRICAN AMERICAN: 14 mL/min — AB (ref >90)
Glucose, Bld: 220 mg/dL — ABNORMAL HIGH (ref 70–99)
POTASSIUM: 3.5 meq/L — AB (ref 3.7–5.3)
Phosphorus: 5.5 mg/dL — ABNORMAL HIGH (ref 2.3–4.6)
Sodium: 137 mEq/L (ref 137–147)

## 2014-03-24 LAB — GLUCOSE, CAPILLARY
Glucose-Capillary: 205 mg/dL — ABNORMAL HIGH (ref 70–99)
Glucose-Capillary: 161 mg/dL — ABNORMAL HIGH (ref 70–99)
Glucose-Capillary: 238 mg/dL — ABNORMAL HIGH (ref 70–99)
Glucose-Capillary: 283 mg/dL — ABNORMAL HIGH (ref 70–99)

## 2014-03-24 MED ORDER — COUMADIN BOOK
Freq: Once | Status: AC
  Administered 2014-03-24: 13:00:00
  Filled 2014-03-24: qty 1

## 2014-03-24 MED ORDER — INSULIN ASPART 100 UNIT/ML ~~LOC~~ SOLN
2.0000 [IU] | Freq: Three times a day (TID) | SUBCUTANEOUS | Status: DC
  Administered 2014-03-24 – 2014-03-28 (×12): 2 [IU] via SUBCUTANEOUS

## 2014-03-24 MED ORDER — WARFARIN VIDEO: Freq: Once | Status: DC

## 2014-03-24 MED ORDER — WARFARIN - PHARMACIST DOSING INPATIENT: Freq: Every day | Status: DC

## 2014-03-24 MED ORDER — WARFARIN SODIUM 7.5 MG PO TABS
7.5000 mg | ORAL_TABLET | Freq: Once | ORAL | Status: AC
  Administered 2014-03-24: 7.5 mg via ORAL
  Filled 2014-03-24: qty 1

## 2014-03-24 MED ORDER — ENOXAPARIN SODIUM 100 MG/ML ~~LOC~~ SOLN
90.0000 mg | SUBCUTANEOUS | Status: DC
  Administered 2014-03-24 – 2014-03-28 (×4): 90 mg via SUBCUTANEOUS
  Filled 2014-03-24 (×5): qty 1

## 2014-03-24 MED ORDER — INSULIN ASPART 100 UNIT/ML ~~LOC~~ SOLN
0.0000 [IU] | Freq: Three times a day (TID) | SUBCUTANEOUS | Status: DC
  Administered 2014-03-24: 7 [IU] via SUBCUTANEOUS
  Administered 2014-03-24: 4 [IU] via SUBCUTANEOUS
  Administered 2014-03-24: 7 [IU] via SUBCUTANEOUS
  Administered 2014-03-25: 4 [IU] via SUBCUTANEOUS
  Administered 2014-03-25: 7 [IU] via SUBCUTANEOUS
  Administered 2014-03-25: 15 [IU] via SUBCUTANEOUS
  Administered 2014-03-26: 20 [IU] via SUBCUTANEOUS
  Administered 2014-03-26: 4 [IU] via SUBCUTANEOUS
  Administered 2014-03-27: 2 [IU] via SUBCUTANEOUS
  Administered 2014-03-28: 4 [IU] via SUBCUTANEOUS

## 2014-03-24 NOTE — Progress Notes (Signed)
Vascular lab notified RN that patient has DVT in right arm.  MD notified.  No additional orders placed at this time.  Will continue to monitor.

## 2014-03-24 NOTE — Progress Notes (Signed)
ANTICOAGULATION CONSULT NOTE - Initial Consult  Pharmacy Consult for Lovenox and warfarin Indication: DVT  No Known Allergies  Patient Measurements: Height: 5\' 5"  (165.1 cm) Weight: 206 lb (93.441 kg) IBW/kg (Calculated) : 57   Vital Signs: Temp: 97.3 F (36.3 C) (06/26 1200) Temp src: Oral (06/26 1200) BP: 163/54 mmHg (06/26 1200) Pulse Rate: 70 (06/26 1200)  Labs:  Recent Labs  03/22/14 0339 03/23/14 0337 03/24/14 0510  HGB  --  10.4*  --   HCT  --  30.1*  --   PLT  --  143*  --   CREATININE 3.09* 3.26* 3.17*    Estimated Creatinine Clearance: 19.2 ml/min (by C-G formula based on Cr of 3.17).   Medical History: Past Medical History  Diagnosis Date  . Asthma   . Essential hypertension, benign   . Type 2 diabetes mellitus   . SLE (systemic lupus erythematosus)   . Anemia   . Atrial flutter     TEE/DCCV December 2013 - on Xarelto [facility])  . CKD (chronic kidney disease) stage 3, GFR 30-59 ml/min    Assessment: 59 YOF with h/o SLE now with worsening kidney function s/p biopsy which revealed severe arterioscelerosis and moderate to severe tubulointerstitial scarring. Of note, she has a history of AFlutter but her Xarelto had been discontinued and she did not wish to continue on it. Today she is found to have a new R arm DVT. Due to renal function (Scr 3.17 with est CrCl ~23mL/min), only option is Lovenox + warfarin for 5 days of treatment and INR therapeutic x24 hours. INR on 6/21 was 1.15.  Hgb this morning low but stable at 10.4, platelets are a little low at 143. No bleeding noted. She is also on Aranesp.  Goal of Therapy:  INR 2-3 Anti-Xa level 0.6-1 units/ml 4hrs after LMWH dose given Monitor platelets by anticoagulation protocol: Yes   Plan:  1. Lovenox 1mg /kg q24h = 90mg  subq q24h 2. Warfarin 7.5mg  po x1 tonight 3. Daily PT/INR, CBC q72h 4. Follow for s/s bleeding  Lauren D. Bajbus, PharmD, BCPS Clinical Pharmacist Pager: [phone] 03/24/2014 12:28  PM

## 2014-03-24 NOTE — Progress Notes (Signed)
Background:  68 yo AAF with SLE ( on chronic pred/plaquenil and methotrexate, followed by Dr. Amil Amen), DM, HTN, diastolic dysfunction and severe anemia, with acute kidney injury on chronic (baseline creatinine 1.2-1.4- was 1.2 on 02/16/14) .  S/p renal biopsy which is showing collapsing FSG- details below Impression/Plan  1. Acute kidney injury - h/o SLE; + ANA and + atypical p-ANCA with proteinuria and hematuria. S/p renal biopsy which showed collapsing FSG with severe arteriosclerosis and moderate to severe tubulointerstitial scarring.  This would indicate that there is not much chance for reversibility and that further immune supression is likely not helpful.  Continue plaquenil. hold methotrexate for right now.  Will wean prednisone. Creatinine worsening daily - advanced CKD- We likely just need to make arrangements to initiate chronic HD. No uremic symptoms or indications for dialysis yet but numbers worsening.  My plan is to get vien mapping, call [facility] and plan for AVF on Monday- we should know by then if she will need a PC and initiation of dialysis this hospitalization by then 2. Anemia - severe.  Was to have started procrit 40,000 once per week but had received only one dose PTA. Continue ESA as aranesp 200/week and give IV feraheme to treat fe deficiency. Haptoglobin normal. Transfused a unit Sunday.  Is better 3. S/p acute on chronic diastolic CHF - stopped IVF. Changed lasix to po. Volume stable but is heavy. Increased to 160 BID to keep her out of trouble from a CHF point of view but dont think she is dry given blood pressure- UOP not all recorded but weight is going down 4. SLE - continue pred/plaquenil. Continue to hold MTX for now.  Would recommend getting Dr. Beekman involved for historical aspects of her lupus 5. H/o atrial flutter - SR at present 6. DM2 - sliding scale insulin; pulse steroids have affected DM control adversely- per primary 7. HTN - avoid ACE or ARB at this time; on BB. .   amlodipine 10 at hs - BP is reasonable- may improve further with volume control 8. Fevers - fevers at [facility]. On empiric levaquin, presently afebrile 9. Hyperparathyroidism - calcitriol 0.25/day- phos a little high, no binder yet , will watch 10. Metabolic acidosis - started po bicarb. Increase to 2 tid- is better  Subjective:  Very pleasant- does not seem to be having uremic symptoms.  Just taking in the information given   Objective Vital signs in last 24 hours: Filed Vitals:   03/23/14 1847 03/23/14 2039 03/23/14 2121 03/24/14 0507  BP: 143/57 155/52  148/53  Pulse: 72 65 64 68  Temp: 97.4 F (36.3 C) 97.6 F (36.4 C)  98 F (36.7 C)  TempSrc: Oral Oral  Oral  Resp: 18 18 18 18  Height:      Weight:    93.441 kg (206 lb)  SpO2:  95%  96%   Weight change: -0.659 kg (-1 lb 7.3 oz)  Intake/Output Summary (Last 24 hours) at 03/24/14 0944 Last data filed at 03/24/14 0841  Gross per 24 hour  Intake    795 ml  Output    702 ml  Net     93  ml   Physical Exam:  BP 148/53  Pulse 68  Temp(Src) 98 F (36.7 C) (Oral)  Resp 18  Ht 5\' 5"  (1.651 m)  Wt 93.441 kg (206 lb)  BMI 34.28 kg/m2  SpO2 96% Gen: Pleasant BF NAD  Up in the chair Skin: no rash, cyanosis but has chronic darkening  and skin changes LE's  Neck: no JVD  Chest: Lung fields clear  Heart: regular, no rub or gallop S1S2 No S3 2/6 murmur aortic area/LSB unchanged  No diastolic murmur or rub  Abdomen: soft, obese, no focal tenderness  Ext: some edema of the LE's.  Neuro: alert, Ox3, no focal deficit- a little slow  Recent Labs Lab 03/18/14 0348 03/19/14 0400 03/20/14 0244 03/21/14 0724 03/22/14 0339 03/23/14 0337 03/24/14 0510  NA 131* 132* 131* 135* 138 138 137  K 4.0 4.3 3.5* 3.9 3.3* 3.1* 3.5*  CL 95* 96 94* 96 99 97 95*  CO2 16* 16* 19 22 23 24 23   GLUCOSE 126* 221* 350* 481* 157* 151* 220*  BUN 53* 62* 74* 83* 93* 98* 107*  CREATININE 3.23* 3.20* 2.85* 2.97* 3.09* 3.26* 3.17*  CALCIUM 7.4* 7.7*  8.2* 8.5 8.8 9.1 9.0  PHOS 2.4 4.7* 4.7*  --  4.5 5.1* 5.5*    Recent Labs Lab 03/17/14 1948  03/22/14 0339 03/23/14 0337 03/24/14 0510  AST 34  --   --   --   --   ALT 16  --   --   --   --   ALKPHOS 144*  --   --   --   --   BILITOT 1.1  --   --   --   --   PROT 7.0  --   --   --   --   ALBUMIN 2.2*  < > 1.9* 1.9* 2.0*  < > = values in this interval not displayed.  Recent Labs Lab 03/18/14 0348 03/19/14 0400 03/20/14 0244 03/23/14 0337  WBC 7.5 6.9 8.4 9.1  HGB 7.3* 7.6* 8.8* 10.4*  HCT 21.7* 22.4* 25.7* 30.1*  MCV 82.2 82.7 80.6 82.0  PLT 181 169 213 143*   No results found for this basename: CKTOTAL, CKMB, CKMBINDEX, TROPONINI,  in the last 168 hours  Recent Labs Lab 03/23/14 1109 03/23/14 1127 03/23/14 1615 03/23/14 2109 03/24/14 0541  GLUCAP 124* 134* 174* 231* 205*     No results found for this basename: IRON, TIBC, TRANSFERRIN, FERRITIN,  in the last 168 hours Results for SKYLARROSE, HERINGER (MRN RE:[id number]) as of 03/18/2014 13:58  Ref. Range 03/17/2014 19:48  Haptoglobin Latest Range: 45-215 mg/dL 243 (H)  Results for ANTWAN, RUCCI (MRN RE:[id number]) as of 03/18/2014 13:58  Ref. Range 03/13/2014 09:10 03/14/2014 18:19 03/17/2014 09:26  Color, Urine Latest Range: YELLOW  YELLOW YELLOW YELLOW  APPearance Latest Range: CLEAR  CLEAR HAZY (A) HAZY (A)  Specific Gravity, Urine Latest Range: 1.005-1.030  >1.030 (H) 1.025 >1.030 (H)  pH Latest Range: 5.0-8.0  5.5 5.5 5.5  Glucose Latest Range: NEGATIVE mg/dL NEGATIVE NEGATIVE NEGATIVE  Bilirubin Urine Latest Range: NEGATIVE  SMALL (A) NEGATIVE NEGATIVE  Ketones, ur Latest Range: NEGATIVE mg/dL NEGATIVE NEGATIVE NEGATIVE  Protein Latest Range: NEGATIVE mg/dL 100 (A) 100 (A) >300 (A)  Urobilinogen, UA Latest Range: 0.0-1.0 mg/dL 0.2 0.2 1.0  Nitrite Latest Range: NEGATIVE  NEGATIVE NEGATIVE NEGATIVE  Leukocytes, UA Latest Range: NEGATIVE  NEGATIVE NEGATIVE NEGATIVE  Hgb urine dipstick Latest Range: NEGATIVE   TRACE (A) MODERATE (A) MODERATE (A)  WBC, UA Latest Range: <3 WBC/hpf  0-2 7-10  RBC / HPF Latest Range: <3 RBC/hpf 3-6 0-2 3-6  Squamous Epithelial / LPF Latest Range: RARE   MANY (A) MANY (A)  Bacteria, UA Latest Range: RARE  MANY (A) MANY (A) MANY (A)  Casts Latest Range: NEGATIVE   HYALINE  CASTS (A)    Medications:   . albuterol  3 mL Inhalation BID  . amLODipine  10 mg Oral QHS  . antiseptic oral rinse  15 mL Mouth Rinse q12n4p  . aspirin EC  81 mg Oral Daily  . atorvastatin  40 mg Oral q1800  . calcitRIOL  0.25 mcg Oral Daily  . chlorhexidine  15 mL Mouth Rinse BID  . darbepoetin (ARANESP) injection - NON-DIALYSIS  200 mcg Subcutaneous Q Sat-1800  . folic acid  1 mg Oral Daily  . furosemide  160 mg Oral BID  . hydroxychloroquine  200 mg Oral BID  . insulin aspart  0-20 Units Subcutaneous TID WC  . insulin aspart  0-5 Units Subcutaneous QHS  . insulin aspart  2 Units Subcutaneous TID WC  . metoprolol  50 mg Oral BID  . predniSONE  30 mg Oral Q breakfast  . sodium bicarbonate  1,300 mg Oral TID  . sodium chloride  3 mL Intravenous Q12H      GOLDSBOROUGH,KELLIE A   03/24/2014, 9:44 AM

## 2014-03-24 NOTE — Progress Notes (Addendum)
Right  Upper Extremity Vein Map    Cephalic Unable to visualize   Basilic  Segment Diameter Depth Comment  1. Axilla 4.40mm 33.69mm   2. Mid upper arm   Unable to visualize.  3. Above [facility]   Unable to visualize.  4. In [facility]   Unable to visualize.  5. Below AC   Unable to visualize.  6. Mid forearm   Unable to visualize.  7. Wrist   Unable to visualize.                  Left Upper Extremity Vein Map    Cephalic Unable to visualize  Basilic  Segment Diameter Depth Comment  1. Axilla 4.41mm 36.73mm   2. Mid upper arm 3.49mm 27.80mm   3. Above [facility]     4. In [facility]     5. Below AC     6. Mid forearm     7. Wrist                     Right Lower Extremity Vein Map    Right Great Saphenous Vein   Segment Diameter Comment  1. Origin 7.20mm   2. High Thigh 1.17mm   3. Mid Thigh  Unable to visualize.  4. Low Thigh  Unable to visualize.  5. At Knee  Unable to visualize.  6. High Calf  Unable to visualize.  7. Low Calf  Unable to visualize.  8. Ankle  Unable to visualize.                Right Small Saphenous Vein Unable to visualize.  Incidental finding: There is evidence of right upper extremity deep vein thrombosis involving a single right brachial vein. There is no obvious evidence of any other deep vein thrombosis involving the right upper extremity or left upper extremity.  03/24/2014 11:02 AM Maudry Mayhew, RVT, RDCS, RDMS

## 2014-03-24 NOTE — Progress Notes (Signed)
[facility] PROGRESS NOTE Interim History: 68 year old female with history of lupus and chronic kidney disease stage III (baseline creatinine 1.2) and chronic anemia who presents to the emergency room with generalized weakness. She was found to have a hemoglobin of 7.3 and creatinine of 2. She was seen by cardiology and started on intravenous Lasix. Unfortunately, her creatinine has continued to rise. Currently it is 3 and her urine output is now unimpressive. She's been having intermittent fevers as high as 103. Renal workup showed positive ANA, double-stranded DNA as well as positive p-ANCA.Marland Kitchen Biopsy done 6.22.2015.it appear septic and her fevers may relate to her underlying connective tissue disease/vasculitis. Status post biopsy; waiting results and will follow renal recommendations. Concerns for lupus nephritis.  Filed Weights   03/22/14 0534 03/23/14 0637 03/24/14 0507  Weight: 95.437 kg (210 lb 6.4 oz) 94.1 kg (207 lb 7.3 oz) 93.441 kg (206 lb)        Intake/Output Summary (Last 24 hours) at 03/24/14 1142 Last data filed at 03/24/14 1100  Gross per 24 hour  Intake    720 ml  Output    852 ml  Net   -132 ml     Assessment/Plan: Acute respiratory failure with hypoxia  - Patient given IV fluids for renal failure and transfusions for anemia on admission. Developed worsening sob with hypoxia. Transferred to ICU on BiPap.  - She was started on IV Lasix and initially had diuresis with improvement in her respiratory status.  - Echo with grade 4 diastolic dysfunction, BNP elevated, chest xray consistent with CHF.  - Poor UOP, weight cont. Improve.  Renal failure (ARF), acute on chronicSLE (systemic lupus erythematosus):  - renal service on board. - h/o SLE; + ANA and + atypical p-ANCA with proteinuria and hematuria.  - biopsy performed w/o complications' showed FSG. - Continue steroids  therapy  - Avoid NSAIDs/ARB, - Strict intake and output  - Cont lasix, Cr  Stable today. -  Plan is to continue prednisone and plaquenil eventually  - vein mapping consult [facility] for fistula or graft 6.29.2015.  Fever  - Patient has remained afebrile  - continue empirically therapy with levaquin; will treat for 3 more days  - fever may be related to her underlying connective tissue disorder/vasculitis.   Hyponatremia:  -Likely related to #2.  -resolved.   Upper ext DVT: - Start Lovenox and coumadin per pharmacy.  Anemia in chronic renal disease and Lupus:  - Somewhat symptomatic on presentation with hemoglobin of 7.3. S/p 1 unit PRBC's since admission.  - Will continue now aranesp and IV iron  - Follow Hgb trend and transfuse 1 unit of PRBCs today for better cushion in anticipation of kidney biopsy  - No source of bleeding appreciated.  Essential hypertension, benign:  - Appears controlled. home medications include losartan and HCTZ which are on hold for now (due to worsening kidney function and vol overload)  - Continue her beta blocker and monitor closely.  - Amlodipine added today for better control of BP   Atrial flutter:  - Sinus rhythm on telemetry and EKG  - No anticoagulation currently as renal biopsy anticipated on Monday (6/22)  - Rate controlled  - Continue beta blocker.  Morbid obesity:  - BMI 34.3.  - Encourage to follow low calorie diet and to increase exercise   Acute on hronic diastolic heart failure  -per cardiology  -stable and compensated currently  -continue PO diuretics a current dose  -denies SOB.  Type 2 diabetes  mellitus with diabetic neuropathy:  -Hemoglobin A1c 6.9. Will hold her oral agents for now.  -uncontrolled now due to steroids  -anticipate improvement once start tapering  -meanwhile will continue SSI resistant, meal coverage and cont.levemir BID    Code Status: full  Family Communication: Discussed with husband at the bedside  Disposition Plan: to be determined     Consultants:  Renal  cardiology  Procedures: ECHO:  none  Antibiotics:  Cont levaquin for 8 days.  HPI/Subjective: Sad due to biopsy results. No complains  Objective: Filed Vitals:   03/23/14 1847 03/23/14 2039 03/23/14 2121 03/24/14 0507  BP: 143/57 155/52  148/53  Pulse: 72 65 64 68  Temp: 97.4 F (36.3 C) 97.6 F (36.4 C)  98 F (36.7 C)  TempSrc: Oral Oral  Oral  Resp: 18 18 18 18   Height:      Weight:    93.441 kg (206 lb)  SpO2:  95%  96%     Exam:  General: Alert, awake, oriented x3, in no acute distress.  HEENT: No bruits, no goiter. -JVD, -HJ reflex Heart: Regular rate and rhythm, trace lower ext edema Lungs: Good air movement, clear Abdomen: Soft, nontender, nondistended, positive bowel sounds.     Data Reviewed: Basic Metabolic Panel:  Recent Labs Lab 03/19/14 0400 03/20/14 0244 03/21/14 0724 03/22/14 0339 03/23/14 0337 03/24/14 0510  NA 132* 131* 135* 138 138 137  K 4.3 3.5* 3.9 3.3* 3.1* 3.5*  CL 96 94* 96 99 97 95*  CO2 16* 19 22 23 24 23   GLUCOSE 221* 350* 481* 157* 151* 220*  BUN 62* 74* 83* 93* 98* 107*  CREATININE 3.20* 2.85* 2.97* 3.09* 3.26* 3.17*  CALCIUM 7.7* 8.2* 8.5 8.8 9.1 9.0  PHOS 4.7* 4.7*  --  4.5 5.1* 5.5*   Liver Function Tests:  Recent Labs Lab 03/17/14 1948  03/19/14 0400 03/20/14 0244 03/22/14 0339 03/23/14 0337 03/24/14 0510  AST 34  --   --   --   --   --   --   ALT 16  --   --   --   --   --   --   ALKPHOS 144*  --   --   --   --   --   --   BILITOT 1.1  --   --   --   --   --   --   PROT 7.0  --   --   --   --   --   --   ALBUMIN 2.2*  < > 2.0* 2.1* 1.9* 1.9* 2.0*  < > = values in this interval not displayed. No results found for this basename: LIPASE, AMYLASE,  in the last 168 hours No results found for this basename: AMMONIA,  in the last 168 hours CBC:  Recent Labs Lab 03/17/14 1948 03/18/14 0348 03/19/14 0400 03/20/14 0244 03/23/14 0337  WBC 8.3 7.5 6.9 8.4 9.1  HGB 7.7* 7.3* 7.6* 8.8* 10.4*  HCT 23.1* 21.7* 22.4* 25.7* 30.1*  MCV 82.8  82.2 82.7 80.6 82.0  PLT 191 181 169 213 143*   Cardiac Enzymes: No results found for this basename: CKTOTAL, CKMB, CKMBINDEX, TROPONINI,  in the last 168 hours BNP (last 3 results)  Recent Labs  03/15/14 0551  PROBNP 33778.0*   CBG:  Recent Labs Lab 03/23/14 1127 03/23/14 1615 03/23/14 2109 03/24/14 0541 03/24/14 1102  GLUCAP 134* 174* 231* 205* 161*    Recent Results (from the past 240  hour(s))  MRSA PCR SCREENING     Status: None   Collection Time    03/14/14  6:02 PM      Result Value Ref Range Status   MRSA by PCR NEGATIVE  NEGATIVE Final   Comment:            The GeneXpert MRSA Assay (FDA     approved for NASAL specimens     only), is one component of a     comprehensive MRSA colonization     surveillance program. It is not     intended to diagnose MRSA     infection nor to guide or     monitor treatment for     MRSA infections.  URINE CULTURE     Status: None   Collection Time    03/14/14  6:34 PM      Result Value Ref Range Status   Specimen Description URINE, CATHETERIZED   Final   Special Requests IMMUNE:COMPROMISED   Final   Culture  Setup Time     Final   Value: 03/15/2014 14:46     Performed at [place]     Final   Value: NO GROWTH     Performed at [facility]   Culture     Final   Value: NO GROWTH     Performed at [facility]   Report Status 03/16/2014 FINAL   Final  CULTURE, BLOOD (ROUTINE X 2)     Status: None   Collection Time    03/14/14  6:59 PM      Result Value Ref Range Status   Specimen Description BLOOD RIGHT ARM POSSIBLE TRANSFUSION REACTION   Final   Special Requests     Final   Value: BOTTLES DRAWN AEROBIC AND ANAEROBIC AEB=6CC ANA=7CC   Culture NO GROWTH 6 DAYS   Final   Report Status 03/20/2014 FINAL   Final  CULTURE, BLOOD (ROUTINE X 2)     Status: None   Collection Time    03/14/14  7:26 PM      Result Value Ref Range Status   Specimen Description BLOOD LEFT ARM POSSIBLE  TRANSFUSION REACTION   Final   Special Requests BOTTLES DRAWN AEROBIC ONLY 4CC   Final   Culture NO GROWTH 6 DAYS   Final   Report Status 03/20/2014 FINAL   Final  CULTURE, BLOOD (ROUTINE X 2)     Status: None   Collection Time    03/17/14  9:11 AM      Result Value Ref Range Status   Specimen Description BLOOD RIGHT HAND   Final   Special Requests BOTTLES DRAWN AEROBIC ONLY 10CC   Final   Culture NO GROWTH 5 DAYS   Final   Report Status 03/22/2014 FINAL   Final  CULTURE, BLOOD (ROUTINE X 2)     Status: None   Collection Time    03/17/14  9:17 AM      Result Value Ref Range Status   Specimen Description BLOOD LEFT HAND   Final   Special Requests BOTTLES DRAWN AEROBIC ONLY 8CC   Final   Culture NO GROWTH 5 DAYS   Final   Report Status 03/22/2014 FINAL   Final     Studies: No results found.  Scheduled Meds: . albuterol  3 mL Inhalation BID  . amLODipine  10 mg Oral QHS  . antiseptic oral rinse  15 mL Mouth Rinse q12n4p  . aspirin  EC  81 mg Oral Daily  . atorvastatin  40 mg Oral q1800  . calcitRIOL  0.25 mcg Oral Daily  . chlorhexidine  15 mL Mouth Rinse BID  . darbepoetin (ARANESP) injection - NON-DIALYSIS  200 mcg Subcutaneous Q Sat-1800  . folic acid  1 mg Oral Daily  . furosemide  160 mg Oral BID  . hydroxychloroquine  200 mg Oral BID  . insulin aspart  0-20 Units Subcutaneous TID WC  . insulin aspart  0-5 Units Subcutaneous QHS  . insulin aspart  2 Units Subcutaneous TID WC  . metoprolol  50 mg Oral BID  . predniSONE  30 mg Oral Q breakfast  . sodium bicarbonate  1,300 mg Oral TID  . sodium chloride  3 mL Intravenous Q12H   Continuous Infusions:    [name]  [facility] Pager [phone] If 8PM-8AM, please contact night-coverage at [email or web address], password [facility] 03/24/2014, 11:42 AM  LOS: 11 days     **Disclaimer: This note may have been dictated with voice recognition software. Similar sounding words can inadvertently be transcribed and this  note may contain transcription errors which may not have been corrected upon publication of note.**

## 2014-03-24 NOTE — Progress Notes (Signed)
[facility] PROGRESS NOTE Interim History: 68 year old female with history of lupus and chronic kidney disease stage III (baseline creatinine 1.2) and chronic anemia who presents to the emergency room with generalized weakness. She was found to have a hemoglobin of 7.3 and creatinine of 2. She was seen by cardiology and started on intravenous Lasix. Unfortunately, her creatinine has continued to rise. Currently it is 3 and her urine output is now unimpressive. She's been having intermittent fevers as high as 103. Renal workup showed positive ANA, double-stranded DNA as well as positive p-ANCA.Marland Kitchen Biopsy done 6.22.2015.it appear septic and her fevers may relate to her underlying connective tissue disease/vasculitis. Status post biopsy; waiting results and will follow renal recommendations. Concerns for lupus nephritis.  Filed Weights   03/22/14 0534 03/23/14 0637 03/24/14 0507  Weight: 95.437 kg (210 lb 6.4 oz) 94.1 kg (207 lb 7.3 oz) 93.441 kg (206 lb)        Intake/Output Summary (Last 24 hours) at 03/24/14 1019 Last data filed at 03/24/14 0841  Gross per 24 hour  Intake    795 ml  Output    702 ml  Net     93 ml     Assessment/Plan: Acute respiratory failure with hypoxia  - Patient given IV fluids for renal failure and transfusions for anemia on admission. Developed worsening sob with hypoxia. Transferred to ICU on BiPap.  - She was started on IV Lasix and initially had diuresis with improvement in her respiratory status.  - Echo with grade 4 diastolic dysfunction, BNP elevated, chest xray consistent with CHF.  - Poor UOP, weight cont. Improve.  Renal failure (ARF), acute on chronicSLE (systemic lupus erythematosus):  - renal service on board. - h/o SLE; + ANA and + atypical p-ANCA with proteinuria and hematuria.  - biopsy performed w/o complications' showed FSG. - Continue steroids  therapy  - Avoid NSAIDs/ARB, - Strict intake and output  - Cont lasix, Cr  Stable today. -  Plan is to continue prednisone and plaquenil eventually  - vein mapping consult [facility] for fistula or graft 6.29.2015.  Fever  - Patient has remained afebrile  - continue empirically therapy with levaquin; will treat for 3 more days  - fever may be related to her underlying connective tissue disorder/vasculitis.   Hyponatremia:  -Likely related to #2.  -resolved.   Anemia in chronic renal disease and Lupus:  - Somewhat symptomatic on presentation with hemoglobin of 7.3. S/p 1 unit PRBC's since admission.  - Will continue now aranesp and IV iron  - Follow Hgb trend and transfuse 1 unit of PRBCs today for better cushion in anticipation of kidney biopsy  - No source of bleeding appreciated.  Essential hypertension, benign:  - Appears controlled. home medications include losartan and HCTZ which are on hold for now (due to worsening kidney function and vol overload)  - Continue her beta blocker and monitor closely.  - Amlodipine added today for better control of BP   Atrial flutter:  - sinus rhythm on telemetry and EKG  - no anticoagulation currently as renal biopsy anticipated on Monday (6/22)  - rate controlled  - Continue beta blocker   Morbid obesity:  - BMI 34.3.  - Encourage to follow low calorie diet and to increase exercise   Acute on hronic diastolic heart failure  -per cardiology  -stable and compensated currently  -continue PO diuretics a current dose  -denies SOB.  Type 2 diabetes mellitus with diabetic neuropathy:  -Hemoglobin A1c 6.9.  Will hold her oral agents for now.  -uncontrolled now due to steroids  -anticipate improvement once start tapering  -meanwhile will continue SSI resistant, meal coverage and cont.[email or web address] BID    Code Status: full  Family Communication: Discussed with husband at the bedside  Disposition Plan: to be determined     Consultants:  Renal  cardiology  Procedures: ECHO: none  Antibiotics:  Cont levaquin for 8  days.  HPI/Subjective: Sad due to biopsy results. No complains  Objective: Filed Vitals:   03/23/14 1847 03/23/14 2039 03/23/14 2121 03/24/14 0507  BP: 143/57 155/52  148/53  Pulse: 72 65 64 68  Temp: 97.4 F (36.3 C) 97.6 F (36.4 C)  98 F (36.7 C)  TempSrc: Oral Oral  Oral  Resp: 18 18 18 18   Height:      Weight:    93.441 kg (206 lb)  SpO2:  95%  96%     Exam:  General: Alert, awake, oriented x3, in no acute distress.  HEENT: No bruits, no goiter. -JVD, -HJ reflex Heart: Regular rate and rhythm, trace lower ext edema Lungs: Good air movement, clear Abdomen: Soft, nontender, nondistended, positive bowel sounds.     Data Reviewed: Basic Metabolic Panel:  Recent Labs Lab 03/19/14 0400 03/20/14 0244 03/21/14 0724 03/22/14 0339 03/23/14 0337 03/24/14 0510  NA 132* 131* 135* 138 138 137  K 4.3 3.5* 3.9 3.3* 3.1* 3.5*  CL 96 94* 96 99 97 95*  CO2 16* 19 22 23 24 23   GLUCOSE 221* 350* 481* 157* 151* 220*  BUN 62* 74* 83* 93* 98* 107*  CREATININE 3.20* 2.85* 2.97* 3.09* 3.26* 3.17*  CALCIUM 7.7* 8.2* 8.5 8.8 9.1 9.0  PHOS 4.7* 4.7*  --  4.5 5.1* 5.5*   Liver Function Tests:  Recent Labs Lab 03/17/14 1948  03/19/14 0400 03/20/14 0244 03/22/14 0339 03/23/14 0337 03/24/14 0510  AST 34  --   --   --   --   --   --   ALT 16  --   --   --   --   --   --   ALKPHOS 144*  --   --   --   --   --   --   BILITOT 1.1  --   --   --   --   --   --   PROT 7.0  --   --   --   --   --   --   ALBUMIN 2.2*  < > 2.0* 2.1* 1.9* 1.9* 2.0*  < > = values in this interval not displayed. No results found for this basename: LIPASE, AMYLASE,  in the last 168 hours No results found for this basename: AMMONIA,  in the last 168 hours CBC:  Recent Labs Lab 03/17/14 1948 03/18/14 0348 03/19/14 0400 03/20/14 0244 03/23/14 0337  WBC 8.3 7.5 6.9 8.4 9.1  HGB 7.7* 7.3* 7.6* 8.8* 10.4*  HCT 23.1* 21.7* 22.4* 25.7* 30.1*  MCV 82.8 82.2 82.7 80.6 82.0  PLT 191 181 169 213 143*    Cardiac Enzymes: No results found for this basename: CKTOTAL, CKMB, CKMBINDEX, TROPONINI,  in the last 168 hours BNP (last 3 results)  Recent Labs  03/15/14 0551  PROBNP 33778.0*   CBG:  Recent Labs Lab 03/23/14 1109 03/23/14 1127 03/23/14 1615 03/23/14 2109 03/24/14 0541  GLUCAP 124* 134* 174* 231* 205*    Recent Results (from the past 240 hour(s))  MRSA PCR SCREENING  Status: None   Collection Time    03/14/14  6:02 PM      Result Value Ref Range Status   MRSA by PCR NEGATIVE  NEGATIVE Final   Comment:            The GeneXpert MRSA Assay (FDA     approved for NASAL specimens     only), is one component of a     comprehensive MRSA colonization     surveillance program. It is not     intended to diagnose MRSA     infection nor to guide or     monitor treatment for     MRSA infections.  URINE CULTURE     Status: None   Collection Time    03/14/14  6:34 PM      Result Value Ref Range Status   Specimen Description URINE, CATHETERIZED   Final   Special Requests IMMUNE:COMPROMISED   Final   Culture  Setup Time     Final   Value: 03/15/2014 14:46     Performed at [facility]     Final   Value: NO GROWTH     Performed at [facility]   Culture     Final   Value: NO GROWTH     Performed at [facility]   Report Status 03/16/2014 FINAL   Final  CULTURE, BLOOD (ROUTINE X 2)     Status: None   Collection Time    03/14/14  6:59 PM      Result Value Ref Range Status   Specimen Description BLOOD RIGHT ARM POSSIBLE TRANSFUSION REACTION   Final   Special Requests     Final   Value: BOTTLES DRAWN AEROBIC AND ANAEROBIC AEB=6CC ANA=7CC   Culture NO GROWTH 6 DAYS   Final   Report Status 03/20/2014 FINAL   Final  CULTURE, BLOOD (ROUTINE X 2)     Status: None   Collection Time    03/14/14  7:26 PM      Result Value Ref Range Status   Specimen Description BLOOD LEFT ARM POSSIBLE TRANSFUSION REACTION   Final   Special Requests  BOTTLES DRAWN AEROBIC ONLY 4CC   Final   Culture NO GROWTH 6 DAYS   Final   Report Status 03/20/2014 FINAL   Final  CULTURE, BLOOD (ROUTINE X 2)     Status: None   Collection Time    03/17/14  9:11 AM      Result Value Ref Range Status   Specimen Description BLOOD RIGHT HAND   Final   Special Requests BOTTLES DRAWN AEROBIC ONLY 10CC   Final   Culture NO GROWTH 5 DAYS   Final   Report Status 03/22/2014 FINAL   Final  CULTURE, BLOOD (ROUTINE X 2)     Status: None   Collection Time    03/17/14  9:17 AM      Result Value Ref Range Status   Specimen Description BLOOD LEFT HAND   Final   Special Requests BOTTLES DRAWN AEROBIC ONLY 8CC   Final   Culture NO GROWTH 5 DAYS   Final   Report Status 03/22/2014 FINAL   Final     Studies: No results found.  Scheduled Meds: . albuterol  3 mL Inhalation BID  . amLODipine  10 mg Oral QHS  . antiseptic oral rinse  15 mL Mouth Rinse q12n4p  . aspirin EC  81 mg Oral Daily  . atorvastatin  40 mg Oral q1800  . calcitRIOL  0.25 mcg Oral Daily  . chlorhexidine  15 mL Mouth Rinse BID  . darbepoetin (ARANESP) injection - NON-DIALYSIS  200 mcg Subcutaneous Q Sat-1800  . folic acid  1 mg Oral Daily  . furosemide  160 mg Oral BID  . hydroxychloroquine  200 mg Oral BID  . insulin aspart  0-20 Units Subcutaneous TID WC  . insulin aspart  0-5 Units Subcutaneous QHS  . insulin aspart  2 Units Subcutaneous TID WC  . metoprolol  50 mg Oral BID  . predniSONE  30 mg Oral Q breakfast  . sodium bicarbonate  1,300 mg Oral TID  . sodium chloride  3 mL Intravenous Q12H   Continuous Infusions:    [name]  [facility] Pager [phone] If 8PM-8AM, please contact night-coverage at [email or web address], password [facility] 03/24/2014, 10:19 AM  LOS: 11 days     **Disclaimer: This note may have been dictated with voice recognition software. Similar sounding words can inadvertently be transcribed and this note may contain transcription errors which may not  have been corrected upon publication of note.**

## 2014-03-24 NOTE — Progress Notes (Signed)
Patient alert and oriented x4 throughout shift, vital signs stable.  Patient updated on status of DVT in right arm, right arm restriction, and orders for lovenox and coumadin.  Coumadin booklet provided.  Per vascular surgeon, IV site must be moved to patient's right arm, despite DVT for at least one day prior to any vascular intervention.  Nephrologist on call notified for clarification.  IJ placement ordered and canceled by Dr. Augustin Coupe as IV team not able to place IJs.  Per Dr. Augustin Coupe, maintain left arm IV site and right arm restriction for now until reassessed by MD.  Patient denies any questions or concerns at this time.  Will continue to monitor.

## 2014-03-24 NOTE — Progress Notes (Signed)
Tech reported that there was a scant amount of red blood on toilet paper when wiped after using the BSC.  There was also a drop of blood on bed pad.  MD on call made aware.  Will continue to monitor for further bleeding.

## 2014-03-25 ENCOUNTER — Inpatient Hospital Stay (HOSPITAL_COMMUNITY): Payer: Medicare Other

## 2014-03-25 LAB — GLUCOSE, CAPILLARY
GLUCOSE-CAPILLARY: 319 mg/dL — AB (ref 70–99)
Glucose-Capillary: 233 mg/dL — ABNORMAL HIGH (ref 70–99)
Glucose-Capillary: 186 mg/dL — ABNORMAL HIGH (ref 70–99)
Glucose-Capillary: 311 mg/dL — ABNORMAL HIGH (ref 70–99)

## 2014-03-25 LAB — RENAL FUNCTION PANEL
Albumin: 1.9 g/dL — ABNORMAL LOW (ref 3.5–5.2)
BUN: 107 mg/dL — AB (ref 6–23)
CALCIUM: 8.6 mg/dL (ref 8.4–10.5)
CO2: 26 mEq/L (ref 19–32)
CREATININE: 3.09 mg/dL — AB (ref 0.50–1.10)
Chloride: 97 mEq/L (ref 96–112)
GFR calc Af Amer: 17 mL/min — ABNORMAL LOW (ref >90)
GFR calc non Af Amer: 14 mL/min — ABNORMAL LOW (ref >90)
GLUCOSE: 264 mg/dL — AB (ref 70–99)
PHOSPHORUS: 5 mg/dL — AB (ref 2.3–4.6)
POTASSIUM: 3.3 meq/L — AB (ref 3.7–5.3)
Sodium: 139 mEq/L (ref 137–147)

## 2014-03-25 LAB — PROTIME-INR
INR: 1.18 (ref 0.00–1.49)
PROTHROMBIN TIME: 15 s (ref 11.6–15.2)

## 2014-03-25 MED ORDER — INSULIN DETEMIR 100 UNIT/ML ~~LOC~~ SOLN
15.0000 [IU] | Freq: Every day | SUBCUTANEOUS | Status: DC
  Administered 2014-03-25 – 2014-03-26 (×2): 15 [IU] via SUBCUTANEOUS
  Filled 2014-03-25 (×3): qty 0.15

## 2014-03-25 MED ORDER — WARFARIN SODIUM 7.5 MG PO TABS
7.5000 mg | ORAL_TABLET | Freq: Once | ORAL | Status: DC
  Filled 2014-03-25: qty 1

## 2014-03-25 MED ORDER — PREDNISONE 20 MG PO TABS
20.0000 mg | ORAL_TABLET | Freq: Every day | ORAL | Status: DC
  Administered 2014-03-26 – 2014-03-28 (×3): 20 mg via ORAL
  Filled 2014-03-25 (×4): qty 1

## 2014-03-25 MED ORDER — CEFUROXIME SODIUM 1.5 G IJ SOLR
1.5000 g | INTRAMUSCULAR | Status: AC
  Administered 2014-03-27: 1.5 g via INTRAVENOUS
  Filled 2014-03-25: qty 1.5

## 2014-03-25 MED ORDER — POTASSIUM CHLORIDE CRYS ER 20 MEQ PO TBCR
40.0000 meq | EXTENDED_RELEASE_TABLET | Freq: Two times a day (BID) | ORAL | Status: AC
  Administered 2014-03-25 (×2): 40 meq via ORAL
  Filled 2014-03-25 (×2): qty 2

## 2014-03-25 NOTE — Progress Notes (Signed)
Background:  68 yo AAF with SLE ( on chronic pred/plaquenil and methotrexate, followed by Dr. Amil Amen), DM, HTN, diastolic dysfunction and severe anemia, with acute kidney injury on chronic (baseline creatinine 1.2-1.4- was 1.2 on 02/16/14) .  S/p renal biopsy which is showing collapsing FSG- details below  Impression/Plan  1. Acute kidney injury - h/o SLE; + ANA and + atypical p-ANCA with proteinuria and hematuria. S/p renal biopsy which showed collapsing FSG with severe arteriosclerosis and moderate to severe tubulointerstitial scarring.  This would indicate that there is not much chance for reversibility and that further immune supression is likely not helpful.  Continue plaquenil. hold methotrexate for right now.  Will wean prednisone. Creatinine worsening daily - advanced CKD- We likely just need to make arrangements to initiate chronic HD. No uremic symptoms or indications for dialysis yet but numbers worsening.  My plan is  for AVF on Monday- we should know by then if she will need a PC and initiation of dialysis this hospitalization by then. Kidney function may be stabilizing ?  She now has been diagnosed with right arm dvt-started on lovenox and coumadin. I spoke to [facility], will stop coumadin as they are still planning on taking to OR on Monday, continue lovenox but will hold pre op.  What to do about the IV????? 2. Anemia -   Was to have started procrit 40,000 once per week but had received only one dose PTA. Continue ESA as aranesp 200/week and give IV feraheme to treat fe deficiency. Haptoglobin normal. Transfused a unit Sunday.  Is better 3. S/p acute on chronic diastolic CHF - stopped IVF. Changed lasix to po. Volume stable but is heavy. Continue lasix160 BID to keep her out of trouble from a CHF point of view but dont think she is dry given blood pressure- UOP not all recorded but weight is going down 4. SLE - continue pred/plaquenil. Continue to hold MTX for now.  Would recommend getting Dr.  Amil Amen involved for historical aspects of her lupus 5. H/o atrial flutter - SR at present 6. DM2 - sliding scale insulin; pulse steroids have affected DM control adversely- per primary 7. HTN - avoid ACE or ARB at this time; on BB. Marland Kitchen  amlodipine 10 at hs - BP is reasonable- may improve further with volume control 8. Fevers - fevers at [facility]. On empiric levaquin, presently afebrile 9. Hyperparathyroidism - calcitriol 0.25/day- phos a little high, no binder yet , will watch 10. Metabolic acidosis - started po bicarb. Increase to 2 tid- is better  Subjective:  Very pleasant- does not seem to be having uremic symptoms.  Just taking in the information given Diagnosed with arm DVT   Objective Vital signs in last 24 hours: Filed Vitals:   03/24/14 1423 03/24/14 1945 03/24/14 2052 03/25/14 0404  BP: 135/37  147/44 156/45  Pulse: 73 71 75 72  Temp: 97.7 F (36.5 C)  98 F (36.7 C) 98.2 F (36.8 C)  TempSrc: Oral  Oral Oral  Resp: 18 18 18 17   Height:      Weight:    93.1 kg (205 lb 4 oz)  SpO2: 100% 93% 96% 99%   Weight change: -0.341 kg (-12 oz)  Intake/Output Summary (Last 24 hours) at 03/25/14 1001 Last data filed at 03/25/14 0337  Gross per 24 hour  Intake    480 ml  Output    775 ml  Net   -295 ml   Physical Exam:  BP 156/45  Pulse  72  Temp(Src) 98.2 F (36.8 C) (Oral)  Resp 17  Ht 5\' 5"  (1.651 m)  Wt 93.1 kg (205 lb 4 oz)  BMI 34.16 kg/m2  SpO2 99% Gen: Pleasant BF NAD  Up in the chair Skin: no rash, cyanosis but has chronic darkening and skin changes LE's  Neck: no JVD  Chest: Lung fields clear  Heart: regular, no rub or gallop S1S2 No S3 2/6 murmur aortic area/LSB unchanged  No diastolic murmur or rub  Abdomen: soft, obese, no focal tenderness  Ext: some edema of the LE's.  Neuro: alert, Ox3, no focal deficit- a little slow  Recent Labs Lab 03/19/14 0400 03/20/14 0244 03/21/14 0724 03/22/14 0339 03/23/14 0337 03/24/14 0510 03/25/14 0459  NA 132* 131*  135* 138 138 137 139  K 4.3 3.5* 3.9 3.3* 3.1* 3.5* 3.3*  CL 96 94* 96 99 97 95* 97  CO2 16* 19 22 23 24 23 26   GLUCOSE 221* 350* 481* 157* 151* 220* 264*  BUN 62* 74* 83* 93* 98* 107* 107*  CREATININE 3.20* 2.85* 2.97* 3.09* 3.26* 3.17* 3.09*  CALCIUM 7.7* 8.2* 8.5 8.8 9.1 9.0 8.6  PHOS 4.7* 4.7*  --  4.5 5.1* 5.5* 5.0*    Recent Labs Lab 03/23/14 0337 03/24/14 0510 03/25/14 0459  ALBUMIN 1.9* 2.0* 1.9*    Recent Labs Lab 03/19/14 0400 03/20/14 0244 03/23/14 0337  WBC 6.9 8.4 9.1  HGB 7.6* 8.8* 10.4*  HCT 22.4* 25.7* 30.1*  MCV 82.7 80.6 82.0  PLT 169 213 143*   No results found for this basename: CKTOTAL, CKMB, CKMBINDEX, TROPONINI,  in the last 168 hours  Recent Labs Lab 03/24/14 0541 03/24/14 1102 03/24/14 1612 03/24/14 2201 03/25/14 0557  GLUCAP 205* 161* 238* 283* 233*     No results found for this basename: IRON, TIBC, TRANSFERRIN, FERRITIN,  in the last 168 hours Results for AMARELIS, SCHEIMAN (MRN [id number]) as of 03/18/2014 13:58  Ref. Range 03/17/2014 19:48  Haptoglobin Latest Range: 45-215 mg/dL 243 (H)  Results for TOYE, PATZKE (MRN [id number]) as of 03/18/2014 13:58  Ref. Range 03/13/2014 09:10 03/14/2014 18:19 03/17/2014 09:26  Color, Urine Latest Range: YELLOW  YELLOW YELLOW YELLOW  APPearance Latest Range: CLEAR  CLEAR HAZY (A) HAZY (A)  Specific Gravity, Urine Latest Range: 1.005-1.030  >1.030 (H) 1.025 >1.030 (H)  pH Latest Range: 5.0-8.0  5.5 5.5 5.5  Glucose Latest Range: NEGATIVE mg/dL NEGATIVE NEGATIVE NEGATIVE  Bilirubin Urine Latest Range: NEGATIVE  SMALL (A) NEGATIVE NEGATIVE  Ketones, ur Latest Range: NEGATIVE mg/dL NEGATIVE NEGATIVE NEGATIVE  Protein Latest Range: NEGATIVE mg/dL 100 (A) 100 (A) >300 (A)  Urobilinogen, UA Latest Range: 0.0-1.0 mg/dL 0.2 0.2 1.0  Nitrite Latest Range: NEGATIVE  NEGATIVE NEGATIVE NEGATIVE  Leukocytes, UA Latest Range: NEGATIVE  NEGATIVE NEGATIVE NEGATIVE  Hgb urine dipstick Latest Range: NEGATIVE   TRACE (A) MODERATE (A) MODERATE (A)  WBC, UA Latest Range: <3 WBC/hpf  0-2 7-10  RBC / HPF Latest Range: <3 RBC/hpf 3-6 0-2 3-6  Squamous Epithelial / LPF Latest Range: RARE   MANY (A) MANY (A)  Bacteria, UA Latest Range: RARE  MANY (A) MANY (A) MANY (A)  Casts Latest Range: NEGATIVE   HYALINE CASTS (A)    Medications:   . albuterol  3 mL Inhalation BID  . amLODipine  10 mg Oral QHS  . antiseptic oral rinse  15 mL Mouth Rinse q12n4p  . aspirin EC  81 mg Oral Daily  . atorvastatin  40  mg Oral q1800  . calcitRIOL  0.25 mcg Oral Daily  . chlorhexidine  15 mL Mouth Rinse BID  . darbepoetin (ARANESP) injection - NON-DIALYSIS  200 mcg Subcutaneous Q Sat-1800  . enoxaparin (LOVENOX) injection  90 mg Subcutaneous Q24H  . folic acid  1 mg Oral Daily  . furosemide  160 mg Oral BID  . hydroxychloroquine  200 mg Oral BID  . insulin aspart  0-20 Units Subcutaneous TID WC  . insulin aspart  0-5 Units Subcutaneous QHS  . insulin aspart  2 Units Subcutaneous TID WC  . insulin detemir  15 Units Subcutaneous Daily  . metoprolol  50 mg Oral BID  . potassium chloride  40 mEq Oral BID WC  . predniSONE  30 mg Oral Q breakfast  . sodium bicarbonate  1,300 mg Oral TID  . sodium chloride  3 mL Intravenous Q12H  . warfarin  7.5 mg Oral ONCE-1800  . warfarin   Does not apply Once  . Warfarin - Pharmacist Dosing Inpatient   Does not apply q1800      GOLDSBOROUGH,KELLIE A   03/25/2014, 10:01 AM

## 2014-03-25 NOTE — Progress Notes (Signed)
[facility]  Subjective  - Feels bad   Objective 142/52 80 98.2 F (36.8 C) (Oral) 18 94%  Intake/Output Summary (Last 24 hours) at 03/25/14 1342 Last data filed at 03/25/14 1330  Gross per 24 hour  Intake    840 ml  Output    825 ml  Net     15 ml   Left arm IV in place Right neck central line Right arm restricted ban for DVT 2+ radial pulse bilaterally   Assessment/Planning: Left arm AV access Monday possible basilic vein fistula but most likely graft Please let us know if pt needs Diatek as well Plan discussed with pt and husband  FIELDS,CHARLES E 03/25/2014 1:42 PM --  Laboratory Lab Results:  Recent Labs  03/23/14 0337  WBC 9.1  HGB 10.4*  HCT 30.1*  PLT 143*   BMET  Recent Labs  03/24/14 0510 03/25/14 0459  NA 137 139  K 3.5* 3.3*  CL 95* 97  CO2 23 26  GLUCOSE 220* 264*  BUN 107* 107*  CREATININE 3.17* 3.09*  CALCIUM 9.0 8.6    COAG Lab Results  Component Value Date   INR 1.18 03/25/2014   INR 1.15 03/19/2014   No results found for this basename: PTT

## 2014-03-25 NOTE — Progress Notes (Addendum)
ANTICOAGULATION CONSULT NOTE - Initial Consult  Pharmacy Consult for Lovenox and warfarin Indication: DVT  No Known Allergies  Patient Measurements: Height: 5\' 5"  (165.1 cm) Weight: 205 lb 4 oz (93.1 kg) IBW/kg (Calculated) : 57   Vital Signs: Temp: 98.2 F (36.8 C) (06/27 0404) Temp src: Oral (06/27 0404) BP: 156/45 mmHg (06/27 0404) Pulse Rate: 72 (06/27 0404)  Labs:  Recent Labs  03/23/14 0337 03/24/14 0510 03/25/14 0458 03/25/14 0459  HGB 10.4*  --   --   --   HCT 30.1*  --   --   --   PLT 143*  --   --   --   LABPROT  --   --  15.0  --   INR  --   --  1.18  --   CREATININE 3.26* 3.17*  --  3.09*    Estimated Creatinine Clearance: 19.6 ml/min (by C-G formula based on Cr of 3.09).   Medical History: Past Medical History  Diagnosis Date  . Asthma   . Essential hypertension, benign   . Type 2 diabetes mellitus   . SLE (systemic lupus erythematosus)   . Anemia   . Atrial flutter     TEE/DCCV December 2013 - on Xarelto [facility])  . CKD (chronic kidney disease) stage 3, GFR 30-59 ml/min    Assessment: 30 YOF with h/o SLE now with worsening kidney function s/p biopsy which revealed severe arterioscelerosis and moderate to severe tubulointerstitial scarring. Of note, she has a history of AFlutter but her Xarelto had been discontinued and she did not wish to continue on it. On 6/26 she was found to have a new R arm DVT. Due to renal function (Scr 3.09 with est CrCl ~60mL/min), only option is Lovenox + warfarin for 5 days of treatment and INR therapeutic x24 hours. INR on 6/21 was 1.15, now 1.18 after first dose of warfarin.  Hgb on 6/25 was low but improved to 10.4, platelets are a little low at 139. Nurse noted scant amount of red blood on toilet paper/bed pan. She is also on Aranesp.  Goal of Therapy:  INR 2-3 Anti-Xa level 0.6-1 units/ml 4hrs after LMWH dose given Monitor platelets by anticoagulation protocol: Yes   Plan:  1. Lovenox 1mg /kg q24h = 90mg  subq  q24h  2. Holding warfarin today as will likely get AVF on Monday  3. Daily PT/INR, CBC q72h 4. Follow for further s/s bleeding 5. Consider decreased dose or holding Aranesp today as Hgb >10  Harolyn Rutherford, PharmD Clinical Pharmacist - Resident Pager: [phone] Pharmacy: [phone] 03/25/2014 8:24 AM

## 2014-03-25 NOTE — Progress Notes (Signed)
[facility] PROGRESS NOTE Interim History: 68 year old female with history of lupus and chronic kidney disease stage III (baseline creatinine 1.2) and chronic anemia who presents to the emergency room with generalized weakness. She was found to have a hemoglobin of 7.3 and creatinine of 2. She was seen by cardiology and started on intravenous Lasix. Unfortunately, her creatinine has continued to rise. Currently it is 3 and her urine output is now unimpressive. She's been having intermittent fevers as high as 103. Renal workup showed positive ANA, double-stranded DNA as well as positive p-ANCA.Marland Kitchen Biopsy done 6.22.2015.it appear septic and her fevers may relate to her underlying connective tissue disease/vasculitis. Status post biopsy; waiting results and will follow renal recommendations. Concerns for lupus nephritis.  Filed Weights   03/23/14 0637 03/24/14 0507 03/25/14 0404  Weight: 94.1 kg (207 lb 7.3 oz) 93.441 kg (206 lb) 93.1 kg (205 lb 4 oz)        Intake/Output Summary (Last 24 hours) at 03/25/14 0750 Last data filed at 03/25/14 [id number]  Gross per 24 hour  Intake    720 ml  Output    777 ml  Net    -57 ml     Assessment/Plan: Acute respiratory failure with hypoxia  - Developed worsening sob with hypoxia. Transferred to ICU on BiPap.  - She was started on IV Lasix and initially had diuresis with improvement in her respiratory status.  - Echo with grade 4 diastolic dysfunction, BNP elevated, chest xray consistent with CHF.  - Poor UOP, weight cont. Improve.  Renal failure (ARF), acute on chronicSLE (systemic lupus erythematosus):  - Appreciate renal assistance. - h/o SLE; + ANA and + atypical p-ANCA with proteinuria and hematuria.  - biopsy performed w/o complications' showed FSG. - Continue steroids  Therapy, cont to holf MTX - Avoid NSAIDs/ARB, - Strict intake and output  - Cont lasix, Cr  Is improving daily. UOP is picking up. - Plan is to continue prednisone and  plaquenil eventually  - vein mapping consult [facility] for fistula or graft 6.29.2015.  Fever  - Patient has remained afebrile  - Complete empiric course of Levaquin. - fever may be related to her underlying connective tissue disorder/vasculitis.   Hyponatremia:  -Likely related to #2.  -resolved.   Upper ext DVT: - Coumadin Lovenox and coumadin per pharmacy.  Anemia in chronic renal disease and Lupus:  - Somewhat symptomatic on presentation with hemoglobin of 7.3. S/p 1 unit PRBC's since admission.  - Will continue now aranesp and IV iron  - Follow Hgb trend and transfuse 1 unit of PRBCs today for better cushion in anticipation of kidney biopsy  - No source of bleeding appreciated.  Essential hypertension, benign:  - Appears controlled. home medications include losartan and HCTZ which are on hold for now (due to worsening kidney function and vol overload)  - Continue her beta blocker and monitor closely.  - Amlodipine added today for better control of BP   Atrial flutter:  - Sinus rhythm on telemetry and EKG  - No anticoagulation currently as renal biopsy anticipated on Monday (6/22)  - Rate controlled  - Continue beta blocker.  Morbid obesity:  - BMI 34.3.  - Encourage to follow low calorie diet and to increase exercise   Acute on hronic diastolic heart failure  -per cardiology  -stable and compensated currently  -continue PO diuretics a current dose  -denies SOB.  Type 2 diabetes mellitus with diabetic neuropathy:  -Hemoglobin A1c 6.9. Will hold her oral  agents for now.  -uncontrolled now due to steroids  -anticipate improvement once start tapering  -meanwhile will continue SSI resistant, meal coverage and cont.levemir BID    Code Status: full  Family Communication: Discussed with husband at the bedside  Disposition Plan: to be determined     Consultants:  Renal  cardiology  Procedures: ECHO: none  Antibiotics:   levaquin for 8  days.  HPI/Subjective: . No complains  Objective: Filed Vitals:   03/24/14 1423 03/24/14 1945 03/24/14 2052 03/25/14 0404  BP: 135/37  147/44 156/45  Pulse: 73 71 75 72  Temp: 97.7 F (36.5 C)  98 F (36.7 C) 98.2 F (36.8 C)  TempSrc: Oral  Oral Oral  Resp: 18 18 18 17   Height:      Weight:    93.1 kg (205 lb 4 oz)  SpO2: 100% 93% 96% 99%     Exam:  General: Alert, awake, oriented x3, in no acute distress.  HEENT: No bruits, no goiter. -JVD, -HJ reflex Heart: Regular rate and rhythm, trace lower ext edema Lungs: Good air movement, clear Abdomen: Soft, nontender, nondistended, positive bowel sounds.     Data Reviewed: Basic Metabolic Panel:  Recent Labs Lab 03/20/14 0244 03/21/14 0724 03/22/14 0339 03/23/14 0337 03/24/14 0510 03/25/14 0459  NA 131* 135* 138 138 137 139  K 3.5* 3.9 3.3* 3.1* 3.5* 3.3*  CL 94* 96 99 97 95* 97  CO2 19 22 23 24 23 26   GLUCOSE 350* 481* 157* 151* 220* 264*  BUN 74* 83* 93* 98* 107* 107*  CREATININE 2.85* 2.97* 3.09* 3.26* 3.17* 3.09*  CALCIUM 8.2* 8.5 8.8 9.1 9.0 8.6  PHOS 4.7*  --  4.5 5.1* 5.5* 5.0*   Liver Function Tests:  Recent Labs Lab 03/20/14 0244 03/22/14 0339 03/23/14 0337 03/24/14 0510 03/25/14 0459  ALBUMIN 2.1* 1.9* 1.9* 2.0* 1.9*   No results found for this basename: LIPASE, AMYLASE,  in the last 168 hours No results found for this basename: AMMONIA,  in the last 168 hours CBC:  Recent Labs Lab 03/19/14 0400 03/20/14 0244 03/23/14 0337  WBC 6.9 8.4 9.1  HGB 7.6* 8.8* 10.4*  HCT 22.4* 25.7* 30.1*  MCV 82.7 80.6 82.0  PLT 169 213 143*   Cardiac Enzymes: No results found for this basename: CKTOTAL, CKMB, CKMBINDEX, TROPONINI,  in the last 168 hours BNP (last 3 results)  Recent Labs  03/15/14 0551  PROBNP 33778.0*   CBG:  Recent Labs Lab 03/24/14 0541 03/24/14 1102 03/24/14 1612 03/24/14 2201 03/25/14 0557  GLUCAP 205* 161* 238* 283* 233*    Recent Results (from the past 240  hour(s))  CULTURE, BLOOD (ROUTINE X 2)     Status: None   Collection Time    03/17/14  9:11 AM      Result Value Ref Range Status   Specimen Description BLOOD RIGHT HAND   Final   Special Requests BOTTLES DRAWN AEROBIC ONLY 10CC   Final   Culture NO GROWTH 5 DAYS   Final   Report Status 03/22/2014 FINAL   Final  CULTURE, BLOOD (ROUTINE X 2)     Status: None   Collection Time    03/17/14  9:17 AM      Result Value Ref Range Status   Specimen Description BLOOD LEFT HAND   Final   Special Requests BOTTLES DRAWN AEROBIC ONLY [place]   Final   Culture NO GROWTH 5 DAYS   Final   Report Status 03/22/2014 FINAL  Final     Studies: No results found.  Scheduled Meds: . albuterol  3 mL Inhalation BID  . amLODipine  10 mg Oral QHS  . antiseptic oral rinse  15 mL Mouth Rinse q12n4p  . aspirin EC  81 mg Oral Daily  . atorvastatin  40 mg Oral q1800  . calcitRIOL  0.25 mcg Oral Daily  . chlorhexidine  15 mL Mouth Rinse BID  . darbepoetin (ARANESP) injection - NON-DIALYSIS  200 mcg Subcutaneous Q Sat-1800  . enoxaparin (LOVENOX) injection  90 mg Subcutaneous Q24H  . folic acid  1 mg Oral Daily  . furosemide  160 mg Oral BID  . hydroxychloroquine  200 mg Oral BID  . insulin aspart  0-20 Units Subcutaneous TID WC  . insulin aspart  0-5 Units Subcutaneous QHS  . insulin aspart  2 Units Subcutaneous TID WC  . metoprolol  50 mg Oral BID  . predniSONE  30 mg Oral Q breakfast  . sodium bicarbonate  1,300 mg Oral TID  . sodium chloride  3 mL Intravenous Q12H  . warfarin   Does not apply Once  . Warfarin - Pharmacist Dosing Inpatient   Does not apply q1800   Continuous Infusions:    [name]  [facility] Pager [phone] If 8PM-8AM, please contact night-coverage at [email or web address], password [facility] 03/25/2014, 7:50 AM  LOS: 12 days     **Disclaimer: This note may have been dictated with voice recognition software. Similar sounding words can inadvertently be transcribed and this  note may contain transcription errors which may not have been corrected upon publication of note.**

## 2014-03-25 NOTE — Procedures (Signed)
RIJV PICC 21 cm SVC RA No comp

## 2014-03-26 LAB — RENAL FUNCTION PANEL
Albumin: 1.9 g/dL — ABNORMAL LOW (ref 3.5–5.2)
BUN: 102 mg/dL — AB (ref 6–23)
CALCIUM: 8.4 mg/dL (ref 8.4–10.5)
CO2: 31 mEq/L (ref 19–32)
Chloride: 97 mEq/L (ref 96–112)
Creatinine, Ser: 2.78 mg/dL — ABNORMAL HIGH (ref 0.50–1.10)
GFR calc non Af Amer: 16 mL/min — ABNORMAL LOW (ref >90)
GFR, EST AFRICAN AMERICAN: 19 mL/min — AB (ref >90)
Glucose, Bld: 96 mg/dL (ref 70–99)
PHOSPHORUS: 4.1 mg/dL (ref 2.3–4.6)
Potassium: 3.4 mEq/L — ABNORMAL LOW (ref 3.7–5.3)
SODIUM: 140 meq/L (ref 137–147)

## 2014-03-26 LAB — PROTIME-INR
INR: 1.19 (ref 0.00–1.49)
Prothrombin Time: 15.1 seconds (ref 11.6–15.2)

## 2014-03-26 LAB — GLUCOSE, CAPILLARY
GLUCOSE-CAPILLARY: 193 mg/dL — AB (ref 70–99)
Glucose-Capillary: 91 mg/dL (ref 70–99)
Glucose-Capillary: 355 mg/dL — ABNORMAL HIGH (ref 70–99)
Glucose-Capillary: 326 mg/dL — ABNORMAL HIGH (ref 70–99)

## 2014-03-26 LAB — SURGICAL PCR SCREEN
MRSA, PCR: NEGATIVE
Staphylococcus aureus: NEGATIVE

## 2014-03-26 MED ORDER — FUROSEMIDE 80 MG PO TABS
80.0000 mg | ORAL_TABLET | Freq: Two times a day (BID) | ORAL | Status: DC
  Administered 2014-03-26 – 2014-03-28 (×4): 80 mg via ORAL
  Filled 2014-03-26 (×6): qty 1

## 2014-03-26 MED ORDER — KIDNEY FAILURE BOOK
Freq: Once | Status: AC
  Administered 2014-03-26: 16:00:00
  Filled 2014-03-26: qty 1

## 2014-03-26 MED ORDER — SODIUM CHLORIDE 0.9 % IJ SOLN
10.0000 mL | INTRAMUSCULAR | Status: DC | PRN
  Administered 2014-03-26: 20 mL
  Administered 2014-03-27 (×2): 10 mL
  Administered 2014-03-27 – 2014-03-28 (×2): 20 mL

## 2014-03-26 NOTE — Progress Notes (Signed)
Background:  68 yo AAF with SLE ( on chronic pred/plaquenil and methotrexate, followed by Dr. Amil Amen), DM, HTN, diastolic dysfunction and severe anemia, with acute kidney injury on chronic (baseline creatinine 1.2-1.4- was 1.2 on 02/16/14) .  S/p renal biopsy which is showing collapsing FSG- details below  Impression/Plan  1. Acute kidney injury - h/o SLE; + ANA and + atypical p-ANCA with proteinuria and hematuria. S/p renal biopsy which showed collapsing FSG with severe arteriosclerosis and moderate to severe tubulointerstitial scarring.  This would indicate that there is not much chance for reversibility and that further immune supression is likely not helpful.  Continue plaquenil. hold methotrexate for right now.  Will wean prednisone (took down to 20 mg today). Creatinine now stabilizing- We likely just need to make arrangements to initiate chronic HD. No uremic symptoms or indications for dialysis and numbers stabilizing.  My plan is  for AVF/AVG  on Monday per [facility]- given stabilization of numbers and no uremic symptoms will NOT need PC-  She now has been diagnosed with right arm dvt-started on lovenox and coumadin. I spoke to [facility], will stop coumadin as they are still planning on taking to OR on Monday, continue lovenox but will hold pre op.   2. Anemia -   Was to have started procrit 40,000 once per week but had received only one dose PTA. Continue ESA as aranesp 200/week and give IV feraheme to treat fe deficiency. Haptoglobin normal. Transfused a unit Sunday.  Is better 3. S/p acute on chronic diastolic CHF - stopped IVF. Changed lasix to po. Volume stable but is heavy. Will decrease to 80 BID given bicarb of 31- UOP not all recorded but weight is going down- seems pretty euvolemic at this time 4. SLE - continue pred/plaquenil. Continue to hold MTX for now.   5. H/o atrial flutter - SR at present 6. DM2 - sliding scale insulin; pulse steroids have affected DM control adversely- per primary 7. HTN -  avoid ACE or ARB at this time; on BB. Marland Kitchen  amlodipine 10 at hs - BP is reasonable- may improve further with volume control 8. Fevers - fevers at [facility]. On empiric levaquin, presently afebrile 9. Hyperparathyroidism - calcitriol 0.25/day- phos a little high, no binder yet , will watch 10. Metabolic acidosis - better, will stop bicarb- ?contraction alkalosis- will back off on lasix just slightly 11. DVT- on lovenox- coumadin to be started after OR  Subjective:  Very pleasant- does not seem to be having uremic symptoms.  Just taking in the information given    Objective Vital signs in last 24 hours: Filed Vitals:   03/25/14 2044 03/26/14 0326 03/26/14 0327 03/26/14 0950  BP: 173/58  147/40 144/55  Pulse: 78  80 78  Temp: 97.2 F (36.2 C)  97.9 F (36.6 C)   TempSrc: Oral  Oral   Resp: 18  19   Height:      Weight:  91.763 kg (202 lb 4.8 oz)    SpO2: 94%  97%    Weight change: -1.337 kg (-2 lb 15.2 oz)  Intake/Output Summary (Last 24 hours) at 03/26/14 1011 Last data filed at 03/26/14 0949  Gross per 24 hour  Intake   1470 ml  Output   1701 ml  Net   -231 ml   Physical Exam:  BP 144/55  Pulse 78  Temp(Src) 97.9 F (36.6 C) (Oral)  Resp 19  Ht 5\' 5"  (1.651 m)  Wt 91.763 kg (202 lb 4.8 oz)  BMI 33.66 kg/m2  SpO2 97% Gen: Pleasant BF NAD  Up in the chair Skin: no rash, cyanosis but has chronic darkening and skin changes LE's  Neck: no JVD  Chest: Lung fields clear  Heart: regular, no rub or gallop S1S2 No S3 2/6 murmur aortic area/LSB unchanged  No diastolic murmur or rub  Abdomen: soft, obese, no focal tenderness  Ext: edema much better Neuro: alert, Ox3, no focal deficit- a little slow  Recent Labs Lab 03/20/14 0244 03/21/14 0724 03/22/14 0339 03/23/14 0337 03/24/14 0510 03/25/14 0459 03/26/14 0603  NA 131* 135* 138 138 137 139 140  K 3.5* 3.9 3.3* 3.1* 3.5* 3.3* 3.4*  CL 94* 96 99 97 95* 97 97  CO2 19 22 23 24 23 26 31   GLUCOSE 350* 481* 157* 151* 220*  264* 96  BUN 74* 83* 93* 98* 107* 107* 102*  CREATININE 2.85* 2.97* 3.09* 3.26* 3.17* 3.09* 2.78*  CALCIUM 8.2* 8.5 8.8 9.1 9.0 8.6 8.4  PHOS 4.7*  --  4.5 5.1* 5.5* 5.0* 4.1    Recent Labs Lab 03/24/14 0510 03/25/14 0459 03/26/14 0603  ALBUMIN 2.0* 1.9* 1.9*    Recent Labs Lab 03/20/14 0244 03/23/14 0337  WBC 8.4 9.1  HGB 8.8* 10.4*  HCT 25.7* 30.1*  MCV 80.6 82.0  PLT 213 143*   No results found for this basename: CKTOTAL, CKMB, CKMBINDEX, TROPONINI,  in the last 168 hours  Recent Labs Lab 03/25/14 0557 03/25/14 1113 03/25/14 1627 03/25/14 2109 03/26/14 0610  GLUCAP 233* 186* 319* 311* 91     No results found for this basename: IRON, TIBC, TRANSFERRIN, FERRITIN,  in the last 168 hours Results for CAYTLYN, KEDDIE (MRN RE:[id number]) as of 03/18/2014 13:58  Ref. Range 03/17/2014 19:48  Haptoglobin Latest Range: 45-215 mg/dL 243 (H)  Results for DIJON, SUPPA (MRN [id number]) as of 03/18/2014 13:58  Ref. Range 03/13/2014 09:10 03/14/2014 18:19 03/17/2014 09:26  Color, Urine Latest Range: YELLOW  YELLOW YELLOW YELLOW  APPearance Latest Range: CLEAR  CLEAR HAZY (A) HAZY (A)  Specific Gravity, Urine Latest Range: 1.005-1.030  >1.030 (H) 1.025 >1.030 (H)  pH Latest Range: 5.0-8.0  5.5 5.5 5.5  Glucose Latest Range: NEGATIVE mg/dL NEGATIVE NEGATIVE NEGATIVE  Bilirubin Urine Latest Range: NEGATIVE  SMALL (A) NEGATIVE NEGATIVE  Ketones, ur Latest Range: NEGATIVE mg/dL NEGATIVE NEGATIVE NEGATIVE  Protein Latest Range: NEGATIVE mg/dL 100 (A) 100 (A) >300 (A)  Urobilinogen, UA Latest Range: 0.0-1.0 mg/dL 0.2 0.2 1.0  Nitrite Latest Range: NEGATIVE  NEGATIVE NEGATIVE NEGATIVE  Leukocytes, UA Latest Range: NEGATIVE  NEGATIVE NEGATIVE NEGATIVE  Hgb urine dipstick Latest Range: NEGATIVE  TRACE (A) MODERATE (A) MODERATE (A)  WBC, UA Latest Range: <3 WBC/hpf  0-2 7-10  RBC / HPF Latest Range: <3 RBC/hpf 3-6 0-2 3-6  Squamous Epithelial / LPF Latest Range: RARE   MANY (A) MANY  (A)  Bacteria, UA Latest Range: RARE  MANY (A) MANY (A) MANY (A)  Casts Latest Range: NEGATIVE   HYALINE CASTS (A)    Medications:   . albuterol  3 mL Inhalation BID  . amLODipine  10 mg Oral QHS  . antiseptic oral rinse  15 mL Mouth Rinse q12n4p  . aspirin EC  81 mg Oral Daily  . atorvastatin  40 mg Oral q1800  . calcitRIOL  0.25 mcg Oral Daily  . [START ON 03/27/2014] cefUROXime (ZINACEF)  IV  1.5 g Intravenous On Call to OR  . chlorhexidine  15 mL Mouth Rinse  BID  . darbepoetin (ARANESP) injection - NON-DIALYSIS  200 mcg Subcutaneous Q Sat-1800  . enoxaparin (LOVENOX) injection  90 mg Subcutaneous Q24H  . folic acid  1 mg Oral Daily  . furosemide  160 mg Oral BID  . hydroxychloroquine  200 mg Oral BID  . insulin aspart  0-20 Units Subcutaneous TID WC  . insulin aspart  0-5 Units Subcutaneous QHS  . insulin aspart  2 Units Subcutaneous TID WC  . insulin detemir  15 Units Subcutaneous Daily  . metoprolol  50 mg Oral BID  . predniSONE  20 mg Oral Q breakfast  . sodium chloride  3 mL Intravenous Q12H      GOLDSBOROUGH,KELLIE A   03/26/2014, 10:11 AM

## 2014-03-26 NOTE — Progress Notes (Signed)
[facility] PROGRESS NOTE Interim History: 68 year old female with history of lupus and chronic kidney disease stage III (baseline creatinine 1.2) and chronic anemia who presents to the emergency room with generalized weakness. She was found to have a hemoglobin of 7.3 and creatinine of 2. She was seen by cardiology and started on intravenous Lasix. Unfortunately, her creatinine has continued to rise. Currently it is 3 and her urine output is now unimpressive. She's been having intermittent fevers as high as 103. Renal workup showed positive ANA, double-stranded DNA as well as positive p-ANCA.Marland Kitchen Biopsy done 6.22.2015.it appear septic and her fevers may relate to her underlying connective tissue disease/vasculitis. Status post biopsy; waiting results and will follow renal recommendations. Concerns for lupus nephritis.  Filed Weights   03/24/14 0507 03/25/14 0404 03/26/14 0326  Weight: 93.441 kg (206 lb) 93.1 kg (205 lb 4 oz) 91.763 kg (202 lb 4.8 oz)        Intake/Output Summary (Last 24 hours) at 03/26/14 0754 Last data filed at 03/26/14 [id number]  Gross per 24 hour  Intake   1080 ml  Output   1700 ml  Net   -620 ml     Assessment/Plan: Acute respiratory failure with hypoxia  - Developed worsening sob with hypoxia. Transferred to ICU on BiPap.  - She was started on IV Lasix and initially had diuresis with improvement in her respiratory status.  - Echo with grade 4 diastolic dysfunction, BNP elevated, chest xray consistent with CHF.  - good UOP, weight cont. Improve.  Renal failure (ARF), acute on chronicSLE (systemic lupus erythematosus)/due FSG:  - + ANA and + atypical p-ANCA with proteinuria and hematuria. Renal function cont to improve. - biopsy performed w/o complications' showed FSG. - Continue steroids  Therapy, cont to holf MTX - Avoid NSAIDs/ARB, - Strict intake and output  - Cont lasix, Cr  Is improving daily. UOP is picking up. - Plan is to continue prednisone and  plaquenil eventually  - vein mapping consult [facility] for fistula or graft 6.29.2015.  Fever  - Patient has remained afebrile  - Complete empiric course of Levaquin. - fever may be related to her underlying connective tissue disorder/vasculitis.   Hyponatremia:  -Likely related to #2.  -resolved.   Upper ext DVT: - Coumadin Lovenox and coumadin per pharmacy.  Anemia in chronic renal disease and Lupus:  - Somewhat symptomatic on presentation with hemoglobin of 7.3. S/p 1 unit PRBC's since admission.  - Will continue now aranesp and IV iron  - Follow Hgb trend and transfuse 1 unit of PRBCs today for better cushion in anticipation of kidney biopsy  - No source of bleeding appreciated.  Essential hypertension, benign:  - Appears controlled. home medications include losartan and HCTZ which are on hold for now (due to worsening kidney function and vol overload)  - Continue her beta blocker and monitor closely.  - Amlodipine added today for better control of BP   Atrial flutter:  - Sinus rhythm on telemetry and EKG  - No anticoagulation currently as renal biopsy anticipated on Monday (6/22)  - Rate controlled  - Continue beta blocker.  Morbid obesity:  - BMI 34.3.  - Encourage to follow low calorie diet and to increase exercise   Acute on hronic diastolic heart failure  -per cardiology  -stable and compensated currently  -continue PO diuretics a current dose  -denies SOB.  Type 2 diabetes mellitus with diabetic neuropathy:  -Hemoglobin A1c 6.9. Will hold her oral agents for now.  -  uncontrolled now due to steroids  -anticipate improvement once start tapering  -meanwhile will continue SSI resistant, meal coverage and cont.levemir BID    Code Status: full  Family Communication: Discussed with husband at the bedside  Disposition Plan: to be determined     Consultants:  Renal  cardiology  Procedures: ECHO: none  Antibiotics:   levaquin for 8  days.  HPI/Subjective: No complains  Objective: Filed Vitals:   03/25/14 2011 03/25/14 2044 03/26/14 0326 03/26/14 0327  BP:  173/58  147/40  Pulse: 76 78  80  Temp:  97.2 F (36.2 C)  97.9 F (36.6 C)  TempSrc:  Oral  Oral  Resp: 18 18  19   Height:      Weight:   91.763 kg (202 lb 4.8 oz)   SpO2: 93% 94%  97%     Exam:  General: Alert, awake, oriented x3, in no acute distress.  HEENT: No bruits, no goiter. -JVD, -HJ reflex Heart: Regular rate and rhythm, trace lower ext edema Lungs: Good air movement, clear Abdomen: Soft, nontender, nondistended, positive bowel sounds.     Data Reviewed: Basic Metabolic Panel:  Recent Labs Lab 03/22/14 0339 03/23/14 0337 03/24/14 0510 03/25/14 0459 03/26/14 0603  NA 138 138 137 139 140  K 3.3* 3.1* 3.5* 3.3* 3.4*  CL 99 97 95* 97 97  CO2 23 24 23 26 31   GLUCOSE 157* 151* 220* 264* 96  BUN 93* 98* 107* 107* 102*  CREATININE 3.09* 3.26* 3.17* 3.09* 2.78*  CALCIUM 8.8 9.1 9.0 8.6 8.4  PHOS 4.5 5.1* 5.5* 5.0* 4.1   Liver Function Tests:  Recent Labs Lab 03/22/14 0339 03/23/14 0337 03/24/14 0510 03/25/14 0459 03/26/14 0603  ALBUMIN 1.9* 1.9* 2.0* 1.9* 1.9*   No results found for this basename: LIPASE, AMYLASE,  in the last 168 hours No results found for this basename: AMMONIA,  in the last 168 hours CBC:  Recent Labs Lab 03/20/14 0244 03/23/14 0337  WBC 8.4 9.1  HGB 8.8* 10.4*  HCT 25.7* 30.1*  MCV 80.6 82.0  PLT 213 143*   Cardiac Enzymes: No results found for this basename: CKTOTAL, CKMB, CKMBINDEX, TROPONINI,  in the last 168 hours BNP (last 3 results)  Recent Labs  03/15/14 0551  PROBNP 33778.0*   CBG:  Recent Labs Lab 03/25/14 0557 03/25/14 1113 03/25/14 1627 03/25/14 2109 03/26/14 0610  GLUCAP 233* 186* 319* 311* 91    Recent Results (from the past 240 hour(s))  CULTURE, BLOOD (ROUTINE X 2)     Status: None   Collection Time    03/17/14  9:11 AM      Result Value Ref Range Status    Specimen Description BLOOD RIGHT HAND   Final   Special Requests BOTTLES DRAWN AEROBIC ONLY 10CC   Final   Culture NO GROWTH 5 DAYS   Final   Report Status 03/22/2014 FINAL   Final  CULTURE, BLOOD (ROUTINE X 2)     Status: None   Collection Time    03/17/14  9:17 AM      Result Value Ref Range Status   Specimen Description BLOOD LEFT HAND   Final   Special Requests BOTTLES DRAWN AEROBIC ONLY 8CC   Final   Culture NO GROWTH 5 DAYS   Final   Report Status 03/22/2014 FINAL   Final     Studies: Ir Fluoro Guide Cv Line Right  03/25/2014   CLINICAL DATA:  Renal failure  EXAM: RIGHT INTERNAL JUGULAR PICC LINE  PLACEMENT WITH ULTRASOUND AND FLUOROSCOPIC GUIDANCE  FLUOROSCOPY TIME:  6 seconds.  PROCEDURE: The patient was advised of the possible risks andcomplications and agreed to undergo the procedure. The patient was then brought to the angiographic suite for the procedure.  The right neck was prepped with chlorhexidine, drapedin the usual sterile fashion using maximum barrier technique (cap and mask, sterile gown, sterile gloves, large sterile sheet, hand hygiene and cutaneous antisepsis) and infiltrated locally with 1% Lidocaine.  Ultrasound demonstrated patency of the right internal jugular vein, and this was documented with an image. Under real-time ultrasound guidance, this vein was accessed with a 21 gauge micropuncture needle and image documentation was performed. A 0.018 wire was introduced in to the vein. Over this, a 5 [place] double lumen Power PICC was advanced to the lower SVC/right atrial junction. Fluoroscopy during the procedure and fluoro spot radiograph confirms appropriate catheter position. The catheter was flushed and covered with asterile dressing. Catheter length is 21 cm.  Complications: None  IMPRESSION: Successful right internal jugular Power PICC line placement with ultrasound and fluoroscopic guidance. The catheter is ready for use.   Electronically Signed   By: Maryclare Bean M.D.    On: 03/25/2014 12:39   Ir US Guide Vasc Access Right  03/25/2014   CLINICAL DATA:  Renal failure  EXAM: RIGHT INTERNAL JUGULAR PICC LINE PLACEMENT WITH ULTRASOUND AND FLUOROSCOPIC GUIDANCE  FLUOROSCOPY TIME:  6 seconds.  PROCEDURE: The patient was advised of the possible risks andcomplications and agreed to undergo the procedure. The patient was then brought to the angiographic suite for the procedure.  The right neck was prepped with chlorhexidine, drapedin the usual sterile fashion using maximum barrier technique (cap and mask, sterile gown, sterile gloves, large sterile sheet, hand hygiene and cutaneous antisepsis) and infiltrated locally with 1% Lidocaine.  Ultrasound demonstrated patency of the right internal jugular vein, and this was documented with an image. Under real-time ultrasound guidance, this vein was accessed with a 21 gauge micropuncture needle and image documentation was performed. A 0.018 wire was introduced in to the vein. Over this, a 5 [place] double lumen Power PICC was advanced to the lower SVC/right atrial junction. Fluoroscopy during the procedure and fluoro spot radiograph confirms appropriate catheter position. The catheter was flushed and covered with asterile dressing. Catheter length is 21 cm.  Complications: None  IMPRESSION: Successful right internal jugular Power PICC line placement with ultrasound and fluoroscopic guidance. The catheter is ready for use.   Electronically Signed   By: Maryclare Bean M.D.   On: 03/25/2014 12:39    Scheduled Meds: . albuterol  3 mL Inhalation BID  . amLODipine  10 mg Oral QHS  . antiseptic oral rinse  15 mL Mouth Rinse q12n4p  . aspirin EC  81 mg Oral Daily  . atorvastatin  40 mg Oral q1800  . calcitRIOL  0.25 mcg Oral Daily  . [START ON 03/27/2014] cefUROXime (ZINACEF)  IV  1.5 g Intravenous On Call to OR  . chlorhexidine  15 mL Mouth Rinse BID  . darbepoetin (ARANESP) injection - NON-DIALYSIS  200 mcg Subcutaneous Q Sat-1800  . enoxaparin  (LOVENOX) injection  90 mg Subcutaneous Q24H  . folic acid  1 mg Oral Daily  . furosemide  160 mg Oral BID  . hydroxychloroquine  200 mg Oral BID  . insulin aspart  0-20 Units Subcutaneous TID WC  . insulin aspart  0-5 Units Subcutaneous QHS  . insulin aspart  2 Units Subcutaneous TID WC  .  insulin detemir  15 Units Subcutaneous Daily  . metoprolol  50 mg Oral BID  . predniSONE  20 mg Oral Q breakfast  . sodium bicarbonate  1,300 mg Oral TID  . sodium chloride  3 mL Intravenous Q12H   Continuous Infusions:    [name]  [facility] Pager [phone] If 8PM-8AM, please contact night-coverage at [email or web address], password [facility] 03/26/2014, 7:54 AM  LOS: 13 days     **Disclaimer: This note may have been dictated with voice recognition software. Similar sounding words can inadvertently be transcribed and this note may contain transcription errors which may not have been corrected upon publication of note.**

## 2014-03-27 ENCOUNTER — Encounter (HOSPITAL_COMMUNITY)
Admission: EM | Disposition: A | Discharge status: Home / Self Care | Payer: Self-pay | Source: Home / Self Care | Attending: Internal Medicine

## 2014-03-27 ENCOUNTER — Encounter (HOSPITAL_COMMUNITY): Payer: Medicare Other | Admitting: Anesthesiology

## 2014-03-27 ENCOUNTER — Other Ambulatory Visit: Payer: Self-pay | Admitting: *Deleted

## 2014-03-27 ENCOUNTER — Inpatient Hospital Stay (HOSPITAL_COMMUNITY): Payer: Medicare Other | Admitting: Anesthesiology

## 2014-03-27 ENCOUNTER — Encounter (HOSPITAL_COMMUNITY): Payer: Self-pay | Admitting: Anesthesiology

## 2014-03-27 DIAGNOSIS — Z4931 Encounter for adequacy testing for hemodialysis: Secondary | ICD-10-CM

## 2014-03-27 DIAGNOSIS — N186 End stage renal disease: Secondary | ICD-10-CM

## 2014-03-27 HISTORY — PX: AV FISTULA PLACEMENT: SHX1204

## 2014-03-27 LAB — BASIC METABOLIC PANEL
BUN: 93 mg/dL — AB (ref 6–23)
CO2: 32 mEq/L (ref 19–32)
CREATININE: 2.55 mg/dL — AB (ref 0.50–1.10)
Calcium: 8.3 mg/dL — ABNORMAL LOW (ref 8.4–10.5)
Chloride: 96 mEq/L (ref 96–112)
GFR calc Af Amer: 21 mL/min — ABNORMAL LOW (ref >90)
GFR, EST NON AFRICAN AMERICAN: 18 mL/min — AB (ref >90)
GLUCOSE: 120 mg/dL — AB (ref 70–99)
Potassium: 3.5 mEq/L — ABNORMAL LOW (ref 3.7–5.3)
Sodium: 139 mEq/L (ref 137–147)

## 2014-03-27 LAB — GLUCOSE, CAPILLARY
Glucose-Capillary: 113 mg/dL — ABNORMAL HIGH (ref 70–99)
Glucose-Capillary: 148 mg/dL — ABNORMAL HIGH (ref 70–99)
Glucose-Capillary: 179 mg/dL — ABNORMAL HIGH (ref 70–99)
Glucose-Capillary: 230 mg/dL — ABNORMAL HIGH (ref 70–99)
Glucose-Capillary: 253 mg/dL — ABNORMAL HIGH (ref 70–99)

## 2014-03-27 LAB — CBC
HEMATOCRIT: 29.3 % — AB (ref 36.0–46.0)
HEMOGLOBIN: 9.4 g/dL — AB (ref 12.0–15.0)
MCH: 27.7 pg (ref 26.0–34.0)
MCHC: 32.1 g/dL (ref 30.0–36.0)
MCV: 86.4 fL (ref 78.0–100.0)
Platelets: 90 10*3/uL — ABNORMAL LOW (ref 150–400)
RBC: 3.39 MIL/uL — ABNORMAL LOW (ref 3.87–5.11)
RDW: 19.7 % — ABNORMAL HIGH (ref 11.5–15.5)
WBC: 7.4 10*3/uL (ref 4.0–10.5)

## 2014-03-27 LAB — PROTIME-INR
INR: 1.1 (ref 0.00–1.49)
Prothrombin Time: 14.2 seconds (ref 11.6–15.2)

## 2014-03-27 LAB — PHOSPHORUS: PHOSPHORUS: 4.3 mg/dL (ref 2.3–4.6)

## 2014-03-27 SURGERY — ARTERIOVENOUS (AV) FISTULA CREATION
Anesthesia: Monitor Anesthesia Care | Site: Arm Upper | Laterality: Left

## 2014-03-27 MED ORDER — FENTANYL CITRATE 0.05 MG/ML IJ SOLN: 25.0000 ug | INTRAMUSCULAR | Status: DC | PRN

## 2014-03-27 MED ORDER — PROPOFOL INFUSION 10 MG/ML OPTIME
INTRAVENOUS | Status: DC | PRN
  Administered 2014-03-27: 100 ug/kg/min via INTRAVENOUS

## 2014-03-27 MED ORDER — GLYCOPYRROLATE 0.2 MG/ML IJ SOLN
INTRAMUSCULAR | Status: AC
  Filled 2014-03-27: qty 1

## 2014-03-27 MED ORDER — MIDAZOLAM HCL 2 MG/2ML IJ SOLN
INTRAMUSCULAR | Status: AC
  Filled 2014-03-27: qty 2

## 2014-03-27 MED ORDER — SODIUM CHLORIDE 0.9 % IV SOLN
INTRAVENOUS | Status: DC
  Administered 2014-03-27: 12:00:00 via INTRAVENOUS

## 2014-03-27 MED ORDER — INSULIN DETEMIR 100 UNIT/ML ~~LOC~~ SOLN
15.0000 [IU] | Freq: Two times a day (BID) | SUBCUTANEOUS | Status: DC
  Administered 2014-03-27 – 2014-03-28 (×3): 15 [IU] via SUBCUTANEOUS
  Filled 2014-03-27 (×4): qty 0.15

## 2014-03-27 MED ORDER — METOCLOPRAMIDE HCL 5 MG/ML IJ SOLN: 10.0000 mg | Freq: Once | INTRAMUSCULAR | Status: DC | PRN

## 2014-03-27 MED ORDER — PROPOFOL 10 MG/ML IV BOLUS
INTRAVENOUS | Status: AC
  Filled 2014-03-27: qty 20

## 2014-03-27 MED ORDER — FENTANYL CITRATE 0.05 MG/ML IJ SOLN
INTRAMUSCULAR | Status: AC
  Filled 2014-03-27: qty 5

## 2014-03-27 MED ORDER — GLYCOPYRROLATE 0.2 MG/ML IJ SOLN
INTRAMUSCULAR | Status: DC | PRN
  Administered 2014-03-27: 0.2 mg via INTRAVENOUS

## 2014-03-27 MED ORDER — LIDOCAINE-EPINEPHRINE 0.5 %-1:200000 IJ SOLN
INTRAMUSCULAR | Status: AC
  Filled 2014-03-27: qty 1

## 2014-03-27 MED ORDER — FENTANYL CITRATE 0.05 MG/ML IJ SOLN
INTRAMUSCULAR | Status: DC | PRN
  Administered 2014-03-27: 50 ug via INTRAVENOUS

## 2014-03-27 MED ORDER — 0.9 % SODIUM CHLORIDE (POUR BTL) OPTIME
TOPICAL | Status: DC | PRN
  Administered 2014-03-27: 1000 mL

## 2014-03-27 MED ORDER — LIDOCAINE-EPINEPHRINE 0.5 %-1:200000 IJ SOLN
INTRAMUSCULAR | Status: DC | PRN
  Administered 2014-03-27: 50 mL

## 2014-03-27 MED ORDER — SODIUM CHLORIDE 0.9 % IR SOLN
Status: DC | PRN
  Administered 2014-03-27: 13:00:00

## 2014-03-27 MED ORDER — SODIUM CHLORIDE 0.9 % IV SOLN
INTRAVENOUS | Status: DC | PRN
  Administered 2014-03-27: 12:00:00 via INTRAVENOUS

## 2014-03-27 MED ORDER — MIDAZOLAM HCL 5 MG/5ML IJ SOLN
INTRAMUSCULAR | Status: DC | PRN
  Administered 2014-03-27: 1 mg via INTRAVENOUS

## 2014-03-27 SURGICAL SUPPLY — 46 items
APL SKNCLS STERI-STRIP NONHPOA (GAUZE/BANDAGES/DRESSINGS) ×2
ARMBAND PINK RESTRICT EXTREMIT (MISCELLANEOUS) ×4 IMPLANT
BENZOIN TINCTURE PRP APPL 2/3 (GAUZE/BANDAGES/DRESSINGS) ×4 IMPLANT
BLADE 10 SAFETY STRL DISP (BLADE) ×4 IMPLANT
CANISTER SUCTION 2500CC (MISCELLANEOUS) ×4 IMPLANT
CLIP LIGATING EXTRA MED SLVR (CLIP) ×4 IMPLANT
CLIP LIGATING EXTRA SM BLUE (MISCELLANEOUS) ×4 IMPLANT
CLOSURE WOUND 1/2 X4 (GAUZE/BANDAGES/DRESSINGS) ×1
COVER PROBE W GEL 5X96 (DRAPES) ×3 IMPLANT
COVER SURGICAL LIGHT HANDLE (MISCELLANEOUS) ×4 IMPLANT
DECANTER SPIKE VIAL GLASS SM (MISCELLANEOUS) ×4 IMPLANT
ELECT REM PT RETURN 9FT ADLT (ELECTROSURGICAL) ×4
ELECTRODE REM PT RTRN 9FT ADLT (ELECTROSURGICAL) ×2 IMPLANT
GAUZE SPONGE 2X2 8PLY STRL LF (GAUZE/BANDAGES/DRESSINGS) ×1 IMPLANT
GEL ULTRASOUND 20GR AQUASONIC (MISCELLANEOUS) IMPLANT
GLOVE BIO SURGEON STRL SZ 6.5 (GLOVE) ×2 IMPLANT
GLOVE BIO SURGEONS STRL SZ 6.5 (GLOVE) ×1
GLOVE BIOGEL PI IND STRL 6.5 (GLOVE) ×5 IMPLANT
GLOVE BIOGEL PI IND STRL 7.0 (GLOVE) ×1 IMPLANT
GLOVE BIOGEL PI IND STRL 7.5 (GLOVE) ×1 IMPLANT
GLOVE BIOGEL PI INDICATOR 6.5 (GLOVE) ×10
GLOVE BIOGEL PI INDICATOR 7.0 (GLOVE) ×2
GLOVE BIOGEL PI INDICATOR 7.5 (GLOVE) ×2
GLOVE ECLIPSE 6.5 STRL STRAW (GLOVE) ×3 IMPLANT
GLOVE ORTHOPEDIC STR SZ6.5 (GLOVE) ×3 IMPLANT
GLOVE SS BIOGEL STRL SZ 7 (GLOVE) ×1 IMPLANT
GLOVE SUPERSENSE BIOGEL SZ 7 (GLOVE) ×2
GOWN STRL REUS W/ TWL LRG LVL3 (GOWN DISPOSABLE) ×9 IMPLANT
GOWN STRL REUS W/TWL LRG LVL3 (GOWN DISPOSABLE) ×24
KIT BASIN OR (CUSTOM PROCEDURE TRAY) ×4 IMPLANT
KIT ROOM TURNOVER OR (KITS) ×4 IMPLANT
NS IRRIG 1000ML POUR BTL (IV SOLUTION) ×4 IMPLANT
PACK CV ACCESS (CUSTOM PROCEDURE TRAY) ×4 IMPLANT
PAD ARMBOARD 7.5X6 YLW CONV (MISCELLANEOUS) ×8 IMPLANT
SPONGE GAUZE 2X2 STER 10/PKG (GAUZE/BANDAGES/DRESSINGS) ×2
SPONGE GAUZE 4X4 12PLY (GAUZE/BANDAGES/DRESSINGS) ×4 IMPLANT
STRIP CLOSURE SKIN 1/2X4 (GAUZE/BANDAGES/DRESSINGS) ×3 IMPLANT
SUT PROLENE 6 0 CC (SUTURE) ×8 IMPLANT
SUT SILK 2 0 FS (SUTURE) IMPLANT
SUT VIC AB 3-0 SH 27 (SUTURE) ×8
SUT VIC AB 3-0 SH 27X BRD (SUTURE) ×4 IMPLANT
TAPE CLOTH SURG 4X10 WHT LF (GAUZE/BANDAGES/DRESSINGS) ×3 IMPLANT
TOWEL OR 17X24 6PK STRL BLUE (TOWEL DISPOSABLE) ×4 IMPLANT
TOWEL OR 17X26 10 PK STRL BLUE (TOWEL DISPOSABLE) ×4 IMPLANT
UNDERPAD 30X30 INCONTINENT (UNDERPADS AND DIAPERS) ×4 IMPLANT
WATER STERILE IRR 1000ML POUR (IV SOLUTION) ×4 IMPLANT

## 2014-03-27 NOTE — Evaluation (Signed)
Physical Therapy Evaluation Patient Details Name: [name] MRN: [id number] DOB: [birth year] Today's Date: 03/27/2014   History of Present Illness  [name] is a 68 y.o. female with past medical history that includes diabetes type 2, SLE, hypertension, COPD, anemia, chronic kidney disease stage III, atrial flutter presents to the emergency department with chief complaint of generalized weakness.  Transferred to ICU 6/117 on bipap due to respiratory failure.  Then transferred[facility] from  [facility] 6/19.  Renal biopsy due to ARF 6/22 positive for scarring, collapsing FSG and plan for stopping immunosuppressive medications and underwent left basilic vein transposition today (03/27/14).   Clinical Impression  Patient presents with decreased independence with mobility as compared to prior to admission, however improved over last time seen by PT two weeks ago prior to transfer to ICU, then to [facility].  She will benefit from skilled PT in the acute setting to allow return home with family assist and HHPT.      Follow Up Recommendations      Equipment Recommendations  Rolling walker with 5" wheels    Recommendations for Other Services       Precautions / Restrictions Precautions Precautions: Fall      Mobility  Bed Mobility Overal bed mobility: Needs Assistance       Supine to sit: Supervision;HOB elevated     General bed mobility comments: use of rails and supervision for safety, bed set up to lower foot of bed  Transfers Overall transfer level: Needs assistance Equipment used: Rolling walker (2 wheeled) Transfers: Sit to/from [facility] Sit to Stand: Min guard Stand pivot transfers: Min guard       General transfer comment: bed to BSC min UE support and minguard; sit<>stand min guard for safety  Ambulation/Gait Ambulation/Gait assistance: Min guard Ambulation Distance (Feet): 90 Feet Assistive device: Rolling walker (2 wheeled) Gait  Pattern/deviations: Step-through pattern;Decreased stride length     General Gait Details: min SOB and one standing rest, but denied need for seated rest  Stairs            Wheelchair Mobility    Modified Rankin (Stroke Patients Only)       Balance Overall balance assessment: Needs assistance         Standing balance support: No upper extremity supported Standing balance-Leahy Scale: Fair Standing balance comment: balanced without UE support bed> BSC, relies on UE support for ambulation                              Pertinent Vitals/Pain No pain complaints    Home Living Family/patient expects to be discharged to:: Private residence Living Arrangements: Spouse/significant other Available Help at Discharge: Family Type of Home: House   Entrance Stairs-Rails: None Entrance Stairs-Number of Steps: 4 Home Layout: Two level;Bed/bath upstairs Home Equipment: Cane - single point;Toilet riser;Shower seat - built in;Grab bars - tub/shower      Prior Function Level of Independence: Independent with assistive device(s)         Comments: Patient reports short distance amb with use of standard cane on the Lt, and uses electric scooter when in stores for longer distances     Hand Dominance   Dominant Hand: Left    Extremity/Trunk Assessment               Lower Extremity Assessment: RLE deficits/detail;LLE deficits/detail RLE Deficits / Details: AROM WFL, strength hip flexion 3-/5, knee extension 4-/5, ankle  DF 4+/5 LLE Deficits / Details: AROM WFL, strength hip flexion 3-/5, knee extension 4-/5, ankle DF 4+/5     Communication   Communication: No difficulties  Cognition Arousal/Alertness: Awake/alert Behavior During Therapy: WFL for tasks assessed/performed Overall Cognitive Status: Within Functional Limits for tasks assessed                      General Comments      Exercises        Assessment/Plan    PT Assessment  Patient needs continued PT services  PT Diagnosis Abnormality of gait;Generalized weakness   PT Problem List Decreased strength;Decreased mobility;Decreased activity tolerance;Decreased balance;Decreased knowledge of use of DME  PT Treatment Interventions DME instruction;Gait training;Stair training;Functional mobility training;Therapeutic activities;Patient/family education;Therapeutic exercise;Balance training   PT Goals (Current goals can be found in the Care Plan section) Acute Rehab PT Goals Patient Stated Goal: Go home PT Goal Formulation: With patient/family Time For Goal Achievement: 04/03/14 Potential to Achieve Goals: Good    Frequency Min 3X/week   Barriers to discharge        Co-evaluation               End of Session Equipment Utilized During Treatment: Gait belt Activity Tolerance: Patient tolerated treatment well Patient left: in bed;with call bell/phone within reach;with family/visitor present (set up dinner tray)           Time: [id number] PT Time Calculation (min): 20 min   Charges:   PT Evaluation $Initial PT Evaluation Tier I: 1 Procedure PT Treatments $Gait Training: 8-22 mins   PT G Codes:          WYNN,CYNDI 2014/04/10, 5:29 PM Magda Kiel, [place] April 10, 2014

## 2014-03-27 NOTE — Transfer of Care (Signed)
Immediate Anesthesia Transfer of Care Note  Patient: [name]  Procedure(s) Performed: Procedure(s): ARTERIOVENOUS (AV) FISTULA CREATION (Left)  Patient Location: PACU  Anesthesia Type:MAC  Level of Consciousness: awake, alert , oriented and patient cooperative  Airway & Oxygen Therapy: Patient Spontanous Breathing  Post-op Assessment: Report given to PACU RN, Post -op Vital signs reviewed and stable and Patient moving all extremities  Post vital signs: Reviewed and stable  Complications: No apparent anesthesia complications

## 2014-03-27 NOTE — Progress Notes (Signed)
Patient evaluated for community based chronic disease management services with [facility] as a benefit of patient's [employer]. Spoke with patient's spouse at bedside to explain [facility] services.  Spouse indicated that the do receives calls from a [facility] telephonic nurse and do not have other needs at this time.  Left contact information and [facility] literature at bedside. Made Inpatient Case Manager aware that [facility] following. Of note, [facility] Care Management services does not replace or interfere with any services that are arranged by inpatient case management or social work.  For additional questions or referrals please contact Corliss Blacker BSN RN [facility] Liaison at [phone].

## 2014-03-27 NOTE — Anesthesia Postprocedure Evaluation (Signed)
Anesthesia Post Note  Patient: [name]  Procedure(s) Performed: Procedure(s) (LRB): ARTERIOVENOUS (AV) FISTULA CREATION (Left)  Anesthesia type: MAC  Patient location: PACU  Post pain: Pain level controlled  Post assessment: Patient's Cardiovascular Status Stable  Last Vitals:  Filed Vitals:   03/27/14 1415  BP: 147/49  Pulse: 77  Temp:   Resp: 16    Post vital signs: Reviewed and stable  Level of consciousness: alert  Complications: No apparent anesthesia complications

## 2014-03-27 NOTE — Op Note (Signed)
    OPERATIVE REPORT  DATE OF SURGERY: 03/27/2014  PATIENT: [name], 68 y.o. female MRN: [id number]  DOB: [birth year]  PRE-OPERATIVE DIAGNOSIS:  Chronic renal insufficiency  POST-OPERATIVE DIAGNOSIS:  Same  PROCEDURE: First stage left basilic vein transposition  SURGEON:  Curt Jews, M.D.  PHYSICIAN ASSISTANT: Collins  ANESTHESIA:  Local with sedation  EBL: Minimal ml  Total I/O In: 0  Out: 1125 [Urine:1125]  BLOOD ADMINISTERED: None  DRAINS: None  SPECIMEN: None  COUNTS CORRECT:  YES  PLAN OF CARE: PACU   PATIENT DISPOSITION:  PACU - hemodynamically stable  PROCEDURE DETAILS: The patient was taken to the upper and placed supine position where the area the left arm prepped in usual fashion. Using local anesthesia incision was made over the antecubital space and carried down to isolate the basilic vein branch and towards the antecubital space. Consults and visualization revealed this did have a moderate sized cephalic vein in the upper arm. He did have a several branches above the antecubital space. Larger branches were early on the ulnar aspect of the arm. A branch coming to the brachial artery was also visualized. This branch was isolated and was mobilized. Tributary branches off this were ligated with 304 so it was divided. This was ligated distally divided and dilated with heparinized saline soaked be adequate for anastomosis. The brachial artery was exposed through the same incision. The brachial artery was occluded proximally and distally was opened 11 blade and symmetry Potts scissors. At length and spatulated and sewn end-to-side to the artery with a running 6-0 Prolene suture. After completion anastomosis clamps removed and excellent thrill was noted. Wound irrigated with saline. Hemostasis obtained with cautery. Wounds were closed with 3-0 Vicryl in the subcutaneous and subcuticular tissue. Benzoin Steri-Strips were applied   Curt Jews,  M.D. 03/27/2014 1:40 PM

## 2014-03-27 NOTE — H&P (View-Only) (Signed)
[facility]  Subjective  - Feels bad   Objective 142/52 80 98.2 F (36.8 C) (Oral) 18 94%  Intake/Output Summary (Last 24 hours) at 03/25/14 1342 Last data filed at 03/25/14 1330  Gross per 24 hour  Intake    840 ml  Output    825 ml  Net     15 ml   Left arm IV in place Right neck central line Right arm restricted ban for DVT 2+ radial pulse bilaterally   Assessment/Planning: Left arm AV access Monday possible basilic vein fistula but most likely graft Please let us know if pt needs Diatek as well Plan discussed with pt and husband  FIELDS,CHARLES E 03/25/2014 1:42 PM --  Laboratory Lab Results:  Recent Labs  03/23/14 0337  WBC 9.1  HGB 10.4*  HCT 30.1*  PLT 143*   BMET  Recent Labs  03/24/14 0510 03/25/14 0459  NA 137 139  K 3.5* 3.3*  CL 95* 97  CO2 23 26  GLUCOSE 220* 264*  BUN 107* 107*  CREATININE 3.17* 3.09*  CALCIUM 9.0 8.6    COAG Lab Results  Component Value Date   INR 1.18 03/25/2014   INR 1.15 03/19/2014   No results found for this basename: PTT

## 2014-03-27 NOTE — Progress Notes (Signed)
[facility] PROGRESS NOTE Interim History: 68 year old female with history of lupus and chronic kidney disease stage III (baseline creatinine 1.2) and chronic anemia who presents to the emergency room with generalized weakness. She was found to have a hemoglobin of 7.3 and creatinine of 2. She was seen by cardiology and started on intravenous Lasix. Unfortunately, her creatinine has continued to rise. Currently it is 3 and her urine output is now unimpressive. She's been having intermittent fevers as high as 103. Renal workup showed positive ANA, double-stranded DNA as well as positive p-ANCA.Marland Kitchen Biopsy done 6.22.2015.it appear septic and her fevers may relate to her underlying connective tissue disease/vasculitis. Status post biopsy; waiting results and will follow renal recommendations. Concerns for lupus nephritis.  Filed Weights   03/25/14 0404 03/26/14 0326 03/27/14 0709  Weight: 93.1 kg (205 lb 4 oz) 91.763 kg (202 lb 4.8 oz) 90.6 kg (199 lb 11.8 oz)        Intake/Output Summary (Last 24 hours) at 03/27/14 0814 Last data filed at 03/27/14 [id number]  Gross per 24 hour  Intake    870 ml  Output   1151 ml  Net   -281 ml     Assessment/Plan: Acute respiratory failure with hypoxia  - Developed worsening sob with hypoxia. Transferred to ICU on BiPap.  - She was started on IV Lasix and initially had diuresis with improvement in her respiratory status.  - Echo with grade 4 diastolic dysfunction, BNP elevated, chest xray consistent with CHF.  - good UOP, weight cont. Improve.  Renal failure (ARF), acute on chronicSLE (systemic lupus erythematosus)/due FSG:  - + ANA and + atypical p-ANCA with proteinuria and hematuria. Renal function cont to improve. - biopsy performed w/o complications' showed FSG. - Continue steroids  Therapy, cont to holf MTX. Cr. Cont to improve. - Avoid NSAIDs/ARB, - Strict intake and output  - Cont lasix, Cr  Is improving daily. UOP is picking up. - Plan is to  continue prednisone (which has been tapered to 20 mg) and plaquenil eventually  - vein mapping consult [facility] for fistula or graft 6.29.2015. - hold coumadin for procedure.  Fever  - Patient has remained afebrile  - Complete empiric course of Levaquin. - fever may be related to her underlying connective tissue disorder/vasculitis.   Hyponatremia:  -Likely related to #2.  -resolved.   Upper ext DVT: - HOld Lovenox for procedure and coumadin.  Anemia in chronic renal disease and Lupus:  - Somewhat symptomatic on presentation with hemoglobin of 7.3. S/p 1 unit PRBC's since admission.  - Will continue now aranesp and IV iron  - Follow Hgb trend and transfuse 1 unit of PRBCs today for better cushion in anticipation of kidney biopsy  - No source of bleeding appreciated.  Essential hypertension, benign:  - Appears controlled. home medications include losartan and HCTZ which are on hold for now (due to worsening kidney function and vol overload)  - Continue her beta blocker and monitor closely.  - Amlodipine added today for better control of BP   Atrial flutter:  - Sinus rhythm on telemetry and EKG  - No anticoagulation currently as renal biopsy anticipated on Monday (6/22)  - Rate controlled  - Continue beta blocker.  Morbid obesity:  - BMI 34.3.  - Encourage to follow low calorie diet and to increase exercise   Acute on chronic diastolic heart failure  -per cardiology  -stable and compensated currently  -continue PO diuretics a current dose  -denies SOB.  Type 2 diabetes mellitus with diabetic neuropathy:  -Hemoglobin A1c 6.9.  -uncontrolled now due to steroids  -anticipate improvement once start tapering  -meanwhile will continue SSI resistant, meal coverage and cont.levemir BID    Code Status: full  Family Communication: Discussed with husband at the bedside  Disposition Plan: to be determined     Consultants:  Renal  cardiology  Procedures: ECHO:  none  Antibiotics:   levaquin for 8 days.  HPI/Subjective: No complains  Objective: Filed Vitals:   03/26/14 1424 03/26/14 2024 03/27/14 0310 03/27/14 0709  BP: 133/46 133/50 120/63 147/61  Pulse: 77 74 83 78  Temp: 98.1 F (36.7 C) 98.1 F (36.7 C) 98.2 F (36.8 C) 98.6 F (37 C)  TempSrc: Oral Oral Oral Oral  Resp: 20 18 17 19   Height:      Weight:    90.6 kg (199 lb 11.8 oz)  SpO2: 100% 98% 100% 100%     Exam:  General: Alert, awake, oriented x3, in no acute distress.  HEENT: No bruits, no goiter. -JVD, -HJ reflex Heart: Regular rate and rhythm, trace lower ext edema Lungs: Good air movement, clear Abdomen: Soft, nontender, nondistended, positive bowel sounds.     Data Reviewed: Basic Metabolic Panel:  Recent Labs Lab 03/23/14 0337 03/24/14 0510 03/25/14 0459 03/26/14 0603 03/27/14 0445  NA 138 137 139 140 139  K 3.1* 3.5* 3.3* 3.4* 3.5*  CL 97 95* 97 97 96  CO2 24 23 26 31  32  GLUCOSE 151* 220* 264* 96 120*  BUN 98* 107* 107* 102* 93*  CREATININE 3.26* 3.17* 3.09* 2.78* 2.55*  CALCIUM 9.1 9.0 8.6 8.4 8.3*  PHOS 5.1* 5.5* 5.0* 4.1 4.3   Liver Function Tests:  Recent Labs Lab 03/22/14 0339 03/23/14 0337 03/24/14 0510 03/25/14 0459 03/26/14 0603  ALBUMIN 1.9* 1.9* 2.0* 1.9* 1.9*   No results found for this basename: LIPASE, AMYLASE,  in the last 168 hours No results found for this basename: AMMONIA,  in the last 168 hours CBC:  Recent Labs Lab 03/23/14 0337 03/27/14 0445  WBC 9.1 7.4  HGB 10.4* 9.4*  HCT 30.1* 29.3*  MCV 82.0 86.4  PLT 143* 90*   Cardiac Enzymes: No results found for this basename: CKTOTAL, CKMB, CKMBINDEX, TROPONINI,  in the last 168 hours BNP (last 3 results)  Recent Labs  03/15/14 0551  PROBNP 33778.0*   CBG:  Recent Labs Lab 03/26/14 0610 03/26/14 1131 03/26/14 1620 03/26/14 2114 03/27/14 0650  GLUCAP 91 193* 355* 326* 113*    Recent Results (from the past 240 hour(s))  CULTURE, BLOOD  (ROUTINE X 2)     Status: None   Collection Time    03/17/14  9:11 AM      Result Value Ref Range Status   Specimen Description BLOOD RIGHT HAND   Final   Special Requests BOTTLES DRAWN AEROBIC ONLY 10CC   Final   Culture NO GROWTH 5 DAYS   Final   Report Status 03/22/2014 FINAL   Final  CULTURE, BLOOD (ROUTINE X 2)     Status: None   Collection Time    03/17/14  9:17 AM      Result Value Ref Range Status   Specimen Description BLOOD LEFT HAND   Final   Special Requests BOTTLES DRAWN AEROBIC ONLY [place]   Final   Culture NO GROWTH 5 DAYS   Final   Report Status 03/22/2014 FINAL   Final  SURGICAL PCR SCREEN     Status:  None   Collection Time    03/26/14  7:15 PM      Result Value Ref Range Status   MRSA, PCR NEGATIVE  NEGATIVE Final   Staphylococcus aureus NEGATIVE  NEGATIVE Final   Comment:            The Xpert SA Assay (FDA     approved for NASAL specimens     in patients over 40 years of age),     is one component of     a comprehensive surveillance     program.  Test performance has     been validated by [employer] for patients greater     than or equal to 2 year old.     It is not intended     to diagnose infection nor to     guide or monitor treatment.     Studies: Ir Fluoro Guide Cv Line Right  03/25/2014   CLINICAL DATA:  Renal failure  EXAM: RIGHT INTERNAL JUGULAR PICC LINE PLACEMENT WITH ULTRASOUND AND FLUOROSCOPIC GUIDANCE  FLUOROSCOPY TIME:  6 seconds.  PROCEDURE: The patient was advised of the possible risks andcomplications and agreed to undergo the procedure. The patient was then brought to the angiographic suite for the procedure.  The right neck was prepped with chlorhexidine, drapedin the usual sterile fashion using maximum barrier technique (cap and mask, sterile gown, sterile gloves, large sterile sheet, hand hygiene and cutaneous antisepsis) and infiltrated locally with 1% Lidocaine.  Ultrasound demonstrated patency of the right internal jugular vein, and  this was documented with an image. Under real-time ultrasound guidance, this vein was accessed with a 21 gauge micropuncture needle and image documentation was performed. A 0.018 wire was introduced in to the vein. Over this, a 5 [place] double lumen Power PICC was advanced to the lower SVC/right atrial junction. Fluoroscopy during the procedure and fluoro spot radiograph confirms appropriate catheter position. The catheter was flushed and covered with asterile dressing. Catheter length is 21 cm.  Complications: None  IMPRESSION: Successful right internal jugular Power PICC line placement with ultrasound and fluoroscopic guidance. The catheter is ready for use.   Electronically Signed   By: Maryclare Bean M.D.   On: 03/25/2014 12:39   Ir US Guide Vasc Access Right  03/25/2014   CLINICAL DATA:  Renal failure  EXAM: RIGHT INTERNAL JUGULAR PICC LINE PLACEMENT WITH ULTRASOUND AND FLUOROSCOPIC GUIDANCE  FLUOROSCOPY TIME:  6 seconds.  PROCEDURE: The patient was advised of the possible risks andcomplications and agreed to undergo the procedure. The patient was then brought to the angiographic suite for the procedure.  The right neck was prepped with chlorhexidine, drapedin the usual sterile fashion using maximum barrier technique (cap and mask, sterile gown, sterile gloves, large sterile sheet, hand hygiene and cutaneous antisepsis) and infiltrated locally with 1% Lidocaine.  Ultrasound demonstrated patency of the right internal jugular vein, and this was documented with an image. Under real-time ultrasound guidance, this vein was accessed with a 21 gauge micropuncture needle and image documentation was performed. A 0.018 wire was introduced in to the vein. Over this, a 5 [place] double lumen Power PICC was advanced to the lower SVC/right atrial junction. Fluoroscopy during the procedure and fluoro spot radiograph confirms appropriate catheter position. The catheter was flushed and covered with asterile dressing. Catheter  length is 21 cm.  Complications: None  IMPRESSION: Successful right internal jugular Power PICC line placement with ultrasound and fluoroscopic guidance. The catheter is  ready for use.   Electronically Signed   By: Maryclare Bean M.D.   On: 03/25/2014 12:39    Scheduled Meds: . albuterol  3 mL Inhalation BID  . amLODipine  10 mg Oral QHS  . antiseptic oral rinse  15 mL Mouth Rinse q12n4p  . aspirin EC  81 mg Oral Daily  . atorvastatin  40 mg Oral q1800  . calcitRIOL  0.25 mcg Oral Daily  . cefUROXime (ZINACEF)  IV  1.5 g Intravenous On Call to OR  . chlorhexidine  15 mL Mouth Rinse BID  . darbepoetin (ARANESP) injection - NON-DIALYSIS  200 mcg Subcutaneous Q Sat-1800  . enoxaparin (LOVENOX) injection  90 mg Subcutaneous Q24H  . folic acid  1 mg Oral Daily  . furosemide  80 mg Oral BID  . hydroxychloroquine  200 mg Oral BID  . insulin aspart  0-20 Units Subcutaneous TID WC  . insulin aspart  0-5 Units Subcutaneous QHS  . insulin aspart  2 Units Subcutaneous TID WC  . insulin detemir  15 Units Subcutaneous Daily  . metoprolol  50 mg Oral BID  . predniSONE  20 mg Oral Q breakfast  . sodium chloride  3 mL Intravenous Q12H   Continuous Infusions:    [name]  [facility] Pager [phone] If 8PM-8AM, please contact night-coverage at [email or web address], password [facility] 03/27/2014, 8:14 AM  LOS: 14 days     **Disclaimer: This note may have been dictated with voice recognition software. Similar sounding words can inadvertently be transcribed and this note may contain transcription errors which may not have been corrected upon publication of note.**

## 2014-03-27 NOTE — Interval H&P Note (Signed)
History and Physical Interval Note:  03/27/2014 11:36 AM  [name]  has presented today for surgery, with the diagnosis of renal failure  The various methods of treatment have been discussed with the patient and family. After consideration of risks, benefits and other options for treatment, the patient has consented to  Procedure(s) with comments: INSERTION OF ARTERIOVENOUS (AV) GORE-TEX GRAFT ARM (Left) - AVGG vs fistula, possible insertion of dialysis catheter as a surgical intervention .  The patient's history has been reviewed, patient examined, no change in status, stable for surgery.  I have reviewed the patient's chart and labs.  Questions were answered to the patient's satisfaction.     EARLY, TODD

## 2014-03-27 NOTE — Progress Notes (Signed)
Patient ID: [name], female   DOB: 1946/01/25, 68 y.o.   MRN: [id number] S:feels well, no new complaints O:BP 134/55  Pulse 50  Temp(Src) 97.7 F (36.5 C) (Oral)  Resp 18  Ht 5\' 5"  (1.651 m)  Wt 90.6 kg (199 lb 11.8 oz)  BMI 33.24 kg/m2  SpO2 99%  Intake/Output Summary (Last 24 hours) at 03/27/14 1057 Last data filed at 03/27/14 0951  Gross per 24 hour  Intake    480 ml  Output   1575 ml  Net  -1095 ml   Intake/Output: I/O last 3 completed shifts: In: 1110 [P.O.:1110] Out: 1901 [Urine:1900; Stool:1]  Intake/Output this shift:  Total I/O In: 0  Out: 825 [Urine:825] Weight change:  Gen:WD WN AAF in NAD CVS:no rub Resp:cta [id number] Ext:+tr- 1+ pretib edema   Recent Labs Lab 03/21/14 0724 03/22/14 0339 03/23/14 0337 03/24/14 0510 03/25/14 0459 03/26/14 0603 03/27/14 0445  NA 135* 138 138 137 139 140 139  K 3.9 3.3* 3.1* 3.5* 3.3* 3.4* 3.5*  CL 96 99 97 95* 97 97 96  CO2 22 23 24 23 26 31  32  GLUCOSE 481* 157* 151* 220* 264* 96 120*  BUN 83* 93* 98* 107* 107* 102* 93*  CREATININE 2.97* 3.09* 3.26* 3.17* 3.09* 2.78* 2.55*  ALBUMIN  --  1.9* 1.9* 2.0* 1.9* 1.9*  --   CALCIUM 8.5 8.8 9.1 9.0 8.6 8.4 8.3*  PHOS  --  4.5 5.1* 5.5* 5.0* 4.1 4.3   Liver Function Tests:  Recent Labs Lab 03/24/14 0510 03/25/14 0459 03/26/14 0603  ALBUMIN 2.0* 1.9* 1.9*   No results found for this basename: LIPASE, AMYLASE,  in the last 168 hours No results found for this basename: AMMONIA,  in the last 168 hours CBC:  Recent Labs Lab 03/23/14 0337 03/27/14 0445  WBC 9.1 7.4  HGB 10.4* 9.4*  HCT 30.1* 29.3*  MCV 82.0 86.4  PLT 143* 90*   Cardiac Enzymes: No results found for this basename: CKTOTAL, CKMB, CKMBINDEX, TROPONINI,  in the last 168 hours CBG:  Recent Labs Lab 03/26/14 0610 03/26/14 1131 03/26/14 1620 03/26/14 2114 03/27/14 0650  GLUCAP 91 193* 355* 326* 113*    Iron Studies: No results found for this basename: IRON, TIBC, TRANSFERRIN,  FERRITIN,  in the last 72 hours Studies/Results: Ir Fluoro Guide Cv Line Right  03/25/2014   CLINICAL DATA:  Renal failure  EXAM: RIGHT INTERNAL JUGULAR PICC LINE PLACEMENT WITH ULTRASOUND AND FLUOROSCOPIC GUIDANCE  FLUOROSCOPY TIME:  6 seconds.  PROCEDURE: The patient was advised of the possible risks andcomplications and agreed to undergo the procedure. The patient was then brought to the angiographic suite for the procedure.  The right neck was prepped with chlorhexidine, drapedin the usual sterile fashion using maximum barrier technique (cap and mask, sterile gown, sterile gloves, large sterile sheet, hand hygiene and cutaneous antisepsis) and infiltrated locally with 1% Lidocaine.  Ultrasound demonstrated patency of the right internal jugular vein, and this was documented with an image. Under real-time ultrasound guidance, this vein was accessed with a 21 gauge micropuncture needle and image documentation was performed. A 0.018 wire was introduced in to the vein. Over this, a 5 [place] double lumen Power PICC was advanced to the lower SVC/right atrial junction. Fluoroscopy during the procedure and fluoro spot radiograph confirms appropriate catheter position. The catheter was flushed and covered with asterile dressing. Catheter length is 21 cm.  Complications: None  IMPRESSION: Successful right internal jugular Power PICC line placement with  ultrasound and fluoroscopic guidance. The catheter is ready for use.   Electronically Signed   By: Maryclare Bean M.D.   On: 03/25/2014 12:39   Ir US Guide Vasc Access Right  03/25/2014   CLINICAL DATA:  Renal failure  EXAM: RIGHT INTERNAL JUGULAR PICC LINE PLACEMENT WITH ULTRASOUND AND FLUOROSCOPIC GUIDANCE  FLUOROSCOPY TIME:  6 seconds.  PROCEDURE: The patient was advised of the possible risks andcomplications and agreed to undergo the procedure. The patient was then brought to the angiographic suite for the procedure.  The right neck was prepped with chlorhexidine,  drapedin the usual sterile fashion using maximum barrier technique (cap and mask, sterile gown, sterile gloves, large sterile sheet, hand hygiene and cutaneous antisepsis) and infiltrated locally with 1% Lidocaine.  Ultrasound demonstrated patency of the right internal jugular vein, and this was documented with an image. Under real-time ultrasound guidance, this vein was accessed with a 21 gauge micropuncture needle and image documentation was performed. A 0.018 wire was introduced in to the vein. Over this, a 5 [place] double lumen Power PICC was advanced to the lower SVC/right atrial junction. Fluoroscopy during the procedure and fluoro spot radiograph confirms appropriate catheter position. The catheter was flushed and covered with asterile dressing. Catheter length is 21 cm.  Complications: None  IMPRESSION: Successful right internal jugular Power PICC line placement with ultrasound and fluoroscopic guidance. The catheter is ready for use.   Electronically Signed   By: Maryclare Bean M.D.   On: 03/25/2014 12:39   . albuterol  3 mL Inhalation BID  . amLODipine  10 mg Oral QHS  . antiseptic oral rinse  15 mL Mouth Rinse q12n4p  . aspirin EC  81 mg Oral Daily  . atorvastatin  40 mg Oral q1800  . calcitRIOL  0.25 mcg Oral Daily  . cefUROXime (ZINACEF)  IV  1.5 g Intravenous On Call to OR  . chlorhexidine  15 mL Mouth Rinse BID  . darbepoetin (ARANESP) injection - NON-DIALYSIS  200 mcg Subcutaneous Q Sat-1800  . enoxaparin (LOVENOX) injection  90 mg Subcutaneous Q24H  . folic acid  1 mg Oral Daily  . furosemide  80 mg Oral BID  . hydroxychloroquine  200 mg Oral BID  . insulin aspart  0-20 Units Subcutaneous TID WC  . insulin aspart  0-5 Units Subcutaneous QHS  . insulin aspart  2 Units Subcutaneous TID WC  . insulin detemir  15 Units Subcutaneous BID  . metoprolol  50 mg Oral BID  . predniSONE  20 mg Oral Q breakfast  . sodium chloride  3 mL Intravenous Q12H    BMET    Component Value Date/Time    NA 139 03/27/2014 0445   K 3.5* 03/27/2014 0445   CL 96 03/27/2014 0445   CO2 32 03/27/2014 0445   GLUCOSE 120* 03/27/2014 0445   BUN 93* 03/27/2014 0445   CREATININE 2.55* 03/27/2014 0445   CALCIUM 8.3* 03/27/2014 0445   CALCIUM 7.0* 03/16/2014 0835   GFRNONAA 18* 03/27/2014 0445   GFRAA 21* 03/27/2014 0445   CBC    Component Value Date/Time   WBC 7.4 03/27/2014 0445   RBC 3.39* 03/27/2014 0445   RBC 2.63* 02/16/2014 1400   HGB 9.4* 03/27/2014 0445   HCT 29.3* 03/27/2014 0445   PLT 90* 03/27/2014 0445   MCV 86.4 03/27/2014 0445   MCH 27.7 03/27/2014 0445   MCHC 32.1 03/27/2014 0445   RDW 19.7* 03/27/2014 0445   LYMPHSABS 1.1 03/15/2014 0551  MONOABS 1.2* 03/15/2014 0551   EOSABS 0.0 03/15/2014 0551   BASOSABS 0.0 03/15/2014 0551    Background:  68 yo AAF with SLE ( on chronic pred/plaquenil and methotrexate, followed by Dr. Amil Amen), DM, HTN, diastolic dysfunction and severe anemia, with acute kidney injury on chronic (baseline creatinine 1.2-1.4- was 1.2 on 02/16/14) . S/p renal biopsy which is showing collapsing FSG-  Assessment/Plan:  1. AKI/CKD- renal biopsy revealed collapsing FSG with severe arteriosclerosis and moterate to severe tubulointerstitial scarring.  Poor prognosis and agree with stopping immunosuppressive medications as risks outweigh the benefits.  Agree with proceeding with AVF/AVG placement as there is not indication to initiate dialysis at this time, however given the biopsy results, RRT is likely to be required in the near future.  Cont with education and preparation. 2. SLE- on pred/plaquenil and hold MTX  3. H/o A flutter- stable 4. DM- per primary 5. HTN- hold off on ACE/ARB at this time given AKI/CKD.  Cont with amlodipine and metoprolol for now. 6. SHPTH- on calcitriol 0.25mg  daily 7. Metabolic acidosis- resolved.  Off of bicarb. 8. DVT- on lovenox and coumadin to start after surgery.  [place] A

## 2014-03-27 NOTE — Anesthesia Preprocedure Evaluation (Signed)
Anesthesia Evaluation  Patient identified by MRN, date of birth, ID band Patient awake    Reviewed: Allergy & Precautions, H&P , NPO status , Patient's Chart, lab work & pertinent test results, reviewed documented beta blocker date and time   Airway Mallampati: II TM Distance: >3 FB Neck ROM: full    Dental   Pulmonary shortness of breath and with exertion, asthma , COPD COPD inhaler, former smoker,  breath sounds clear to auscultation        Cardiovascular hypertension, On Medications and On Home Beta Blockers +CHF + Valvular Problems/Murmurs AI Rhythm:regular     Neuro/Psych negative neurological ROS  negative psych ROS   GI/Hepatic negative GI ROS, Neg liver ROS,   Endo/Other  negative endocrine ROSdiabetes, Insulin Dependent  Renal/GU ESRFRenal disease  negative genitourinary   Musculoskeletal   Abdominal   Peds  Hematology  (+) anemia ,   Anesthesia Other Findings See surgeon's H&P   Reproductive/Obstetrics negative OB ROS                           Anesthesia Physical Anesthesia Plan  ASA: III  Anesthesia Plan: MAC   Post-op Pain Management:    Induction: Intravenous  Airway Management Planned: Simple Face Mask  Additional Equipment:   Intra-op Plan:   Post-operative Plan:   Informed Consent: I have reviewed the patients History and Physical, chart, labs and discussed the procedure including the risks, benefits and alternatives for the proposed anesthesia with the patient or authorized representative who has indicated his/her understanding and acceptance.   Dental Advisory Given  Plan Discussed with: CRNA and Surgeon  Anesthesia Plan Comments:         Anesthesia Quick Evaluation

## 2014-03-27 NOTE — Progress Notes (Signed)
Inpatient Diabetes Program Recommendations  AACE/ADA: New Consensus Statement on Inpatient Glycemic Control (2013)  Target Ranges:  Prepandial:   less than 140 mg/dL      Peak postprandial:   less than 180 mg/dL (1-2 hours)      Critically ill patients:  140 - 180 mg/dL  Results for [name], [name] (MRN [id number]) as of 03/27/2014 11:00  Ref. Range 03/26/2014 06:10 03/26/2014 11:31 03/26/2014 16:20 03/26/2014 21:14  Glucose-Capillary Latest Range: 70-99 mg/dL 91 193 (H) 355 (H) 326 (H)   Please consider increasing Novolog meal coverage to 4 units TID for elevated postprandial cbgs. Thank you  Raoul Pitch BSN, RN,CDE Inpatient Diabetes Coordinator [phone] (team pager)

## 2014-03-28 ENCOUNTER — Other Ambulatory Visit: Payer: Self-pay

## 2014-03-28 ENCOUNTER — Encounter (HOSPITAL_COMMUNITY): Payer: Self-pay | Admitting: Vascular Surgery

## 2014-03-28 ENCOUNTER — Telehealth: Payer: Self-pay | Admitting: Vascular Surgery

## 2014-03-28 DIAGNOSIS — N186 End stage renal disease: Secondary | ICD-10-CM

## 2014-03-28 DIAGNOSIS — Z48812 Encounter for surgical aftercare following surgery on the circulatory system: Secondary | ICD-10-CM

## 2014-03-28 LAB — PROTIME-INR
INR: 1.11 (ref 0.00–1.49)
Prothrombin Time: 14.3 seconds (ref 11.6–15.2)

## 2014-03-28 LAB — GLUCOSE, CAPILLARY
Glucose-Capillary: 112 mg/dL — ABNORMAL HIGH (ref 70–99)
Glucose-Capillary: 173 mg/dL — ABNORMAL HIGH (ref 70–99)

## 2014-03-28 LAB — RENAL FUNCTION PANEL
Albumin: 1.9 g/dL — ABNORMAL LOW (ref 3.5–5.2)
BUN: 86 mg/dL — AB (ref 6–23)
CALCIUM: 8.2 mg/dL — AB (ref 8.4–10.5)
CO2: 31 meq/L (ref 19–32)
CREATININE: 2.54 mg/dL — AB (ref 0.50–1.10)
Chloride: 96 mEq/L (ref 96–112)
GFR calc Af Amer: 21 mL/min — ABNORMAL LOW (ref >90)
GFR calc non Af Amer: 18 mL/min — ABNORMAL LOW (ref >90)
GLUCOSE: 111 mg/dL — AB (ref 70–99)
Phosphorus: 4.3 mg/dL (ref 2.3–4.6)
Potassium: 3.4 mEq/L — ABNORMAL LOW (ref 3.7–5.3)
Sodium: 139 mEq/L (ref 137–147)

## 2014-03-28 MED ORDER — PREDNISONE 20 MG PO TABS: 20.0000 mg | ORAL_TABLET | Freq: Every day | ORAL | Status: DC

## 2014-03-28 MED ORDER — ENOXAPARIN SODIUM 100 MG/ML ~~LOC~~ SOLN: 90.0000 mg | SUBCUTANEOUS | Status: DC

## 2014-03-28 MED ORDER — AMLODIPINE BESYLATE 10 MG PO TABS: 10.0000 mg | ORAL_TABLET | Freq: Every day | ORAL | Status: DC

## 2014-03-28 MED ORDER — INSULIN DETEMIR 100 UNIT/ML ~~LOC~~ SOLN: 20.0000 [IU] | Freq: Two times a day (BID) | SUBCUTANEOUS | Status: DC

## 2014-03-28 MED ORDER — WARFARIN SODIUM 5 MG PO TABS: 5.0000 mg | ORAL_TABLET | Freq: Every day | ORAL | Status: DC

## 2014-03-28 MED ORDER — ZOLPIDEM TARTRATE ER 6.25 MG PO TBCR: 6.2500 mg | EXTENDED_RELEASE_TABLET | Freq: Every evening | ORAL | Status: DC | PRN

## 2014-03-28 MED ORDER — CALCITRIOL 0.25 MCG PO CAPS: 0.2500 ug | ORAL_CAPSULE | Freq: Every day | ORAL | Status: DC

## 2014-03-28 MED ORDER — WARFARIN - PHARMACIST DOSING INPATIENT: Freq: Every day | Status: DC

## 2014-03-28 MED ORDER — FUROSEMIDE 80 MG PO TABS
80.0000 mg | ORAL_TABLET | Freq: Two times a day (BID) | ORAL | Status: DC
Start: 2014-03-28 — End: 2014-06-07

## 2014-03-28 MED ORDER — WARFARIN SODIUM 5 MG PO TABS
5.0000 mg | ORAL_TABLET | Freq: Once | ORAL | Status: AC
  Administered 2014-03-28: 5 mg via ORAL
  Filled 2014-03-28: qty 1

## 2014-03-28 MED ORDER — INSULIN DETEMIR 100 UNIT/ML FLEXPEN: 20.0000 [IU] | PEN_INJECTOR | Freq: Every day | SUBCUTANEOUS | Status: DC

## 2014-03-28 MED ORDER — HYDROCODONE-ACETAMINOPHEN 5-325 MG PO TABS: 1.0000 | ORAL_TABLET | ORAL | Status: DC | PRN

## 2014-03-28 MED ORDER — POTASSIUM CHLORIDE CRYS ER 20 MEQ PO TBCR
40.0000 meq | EXTENDED_RELEASE_TABLET | Freq: Two times a day (BID) | ORAL | Status: DC
  Administered 2014-03-28: 40 meq via ORAL
  Filled 2014-03-28: qty 2

## 2014-03-28 NOTE — Discharge Summary (Signed)
Physician Discharge Summary  [name] LKG:[id number] DOB: 1946-07-17 DOA: 03/13/2014  PCP: [name], MD  Admit date: 03/13/2014 Discharge date: 03/28/2014  Time spent: 35 minutes  Recommendations for Outpatient Follow-up:  1. Follow up with coumadin clinic this week. Home on coumadin and lovenox. 2. Follow up with nephrology in 1 week check a b-met and titrate diuretics as needed. 3. PCP in 2 week titrate insulin as needed.  Discharge Diagnoses:  Principal Problem:   Renal failure (ARF), acute on chronic Active Problems:   Atrial flutter   Morbid obesity   Type 2 diabetes mellitus with diabetic neuropathy   SLE (systemic lupus erythematosus)   Essential hypertension, benign   COPD (chronic obstructive pulmonary disease)   Anemia in chronic renal disease   Dehydration   Hyponatremia   Acute on chronic renal failure   Dyspnea   Diastolic CHF, acute on chronic   Acute respiratory failure with hypoxia   Fever, unspecified   Acute on chronic diastolic congestive heart failure   Discharge Condition: stable  Diet recommendation: renal diet  Filed Weights   03/26/14 0326 03/27/14 0709 03/28/14 0723  Weight: 91.763 kg (202 lb 4.8 oz) 90.6 kg (199 lb 11.8 oz) 90.674 kg (199 lb 14.4 oz)    History of present illness:  68 y.o. female with past medical history that includes diabetes type 2, hypertension, COPD, anemia, chronic kidney disease stage III, atrial flutter presents to the emergency department with chief complaint of generalized weakness. Information is obtained from the patient. She reports that yesterday she began to feel "generally bad all over". She describes generalized weakness, decreased stamina with activity, increased shortness of breath with activity. She denies any fever chills coughing chest pain palpitations lower shin edema orthopnea. She denies any abdominal pain nausea vomiting dysuria hematuria frequency or urgency. She does indicate that  she has been eating and drinking less than her normal amount. He denies any specific pain.   Hospital Course:  Acute respiratory failure with hypoxia  - Developed worsening sob with hypoxia. Transferred to ICU on BiPap.  - She was started on IV Lasix and initially had diuresis with improvement in her respiratory status.  - Echo with grade 4 diastolic dysfunction, BNP elevated, chest xray consistent with CHF.   Renal failure (ARF), acute on chronicSLE (systemic lupus erythematosus)/due FSG:  - + ANA and + atypical p-ANCA with proteinuria and hematuria.   - biopsy performed w/o complications' showed FSG.  - Started on steroids which will cont as an outpatient. Have D/c MTX.  - Avoid NSAIDs/ARB, - Plan is to continue prednisone (which has been tapered to 20 mg) and plaqueni. - vein mapping consult [facility] for fistula or graft 6.29.2015.   Fever  - Patient has remained afebrile  - Complete empiric course of Levaquin.  - fever may be related to her underlying connective tissue disorder/vasculitis.   Hyponatremia:  -Likely related to #2.  -resolved.   Upper ext DVT:  - started on levenox and coumadin. - follow up at the coumadin clinic this week.  Anemia in chronic renal disease and Lupus:  - Somewhat symptomatic on presentation with hemoglobin of 7.3. S/p 1 unit PRBC's since admission.  - Will continue now aranesp and IV iron  - Follow Hgb trend and transfuse 1 unit of PRBCs today for better cushion in anticipation of kidney biopsy  - No source of bleeding appreciated.   Essential hypertension, benign:  - Appears controlled. home medications include losartan and HCTZ  which are on hold for now (due to worsening kidney function and vol overload)  - Continue her beta blocker and monitor closely.  - Amlodipine added today for better control of BP   Atrial flutter:  - Sinus rhythm on telemetry and EKG  - No anticoagulation currently as renal biopsy anticipated on Monday (6/22)  - Rate  controlled  - Continue beta blocker.   Morbid obesity:  - BMI 34.3.  - Encourage to follow low calorie diet and to increase exercise   Acute on chronic diastolic heart failure  -per cardiology  -stable and compensated currently  -continue PO diuretics a current dose  -denies SOB.   Type 2 diabetes mellitus with diabetic neuropathy:  -Hemoglobin A1c 6.9.  -uncontrolled now due to steroids  -anticipate improvement once start tapering  -meanwhile cont.levemir BID    Procedures:  Graft  Vein mapping (i.e. Studies not automatically included, echos, thoracentesis, etc; not x-rays)  Consultations:  Renal [facility]  cardiology  Discharge Exam: Filed Vitals:   03/28/14 1005  BP: 146/64  Pulse: 94  Temp: 97.8 F (36.6 C)  Resp: 18    General: A&O x3 Cardiovascular: RRR Respiratory: good air movem  Discharge Instructions You were cared for by a hospitalist during your hospital stay. If you have any questions about your discharge medications or the care you received while you were in the hospital after you are discharged, you can call the unit and asked to speak with the hospitalist on call if the hospitalist that took care of you is not available. Once you are discharged, your primary care physician will handle any further medical issues. Please note that NO REFILLS for any discharge medications will be authorized once you are discharged, as it is imperative that you return to your primary care physician (or establish a relationship with a primary care physician if you do not have one) for your aftercare needs so that they can reassess your need for medications and monitor your lab values.      Discharge Instructions   Diet - low sodium heart healthy    Complete by:  As directed      Diet - low sodium heart healthy    Complete by:  As directed      Increase activity slowly    Complete by:  As directed      Increase activity slowly    Complete by:  As directed              Medication List    STOP taking these medications       losartan-hydrochlorothiazide 100-25 MG per tablet  Commonly known as:  HYZAAR     meloxicam 7.5 MG tablet  Commonly known as:  MOBIC     metFORMIN 500 MG tablet  Commonly known as:  GLUCOPHAGE     methotrexate 2.5 MG tablet  Commonly known as:  RHEUMATREX     saxagliptin HCl 2.5 MG Tabs tablet  Commonly known as:  ONGLYZA      TAKE these medications       acetaminophen 500 MG tablet  Commonly known as:  TYLENOL  Take 1,000 mg by mouth every 6 (six) hours as needed. For pain     albuterol 108 (90 BASE) MCG/ACT inhaler  Commonly known as:  PROVENTIL HFA;VENTOLIN HFA  Inhale 2 puffs into the lungs every 6 (six) hours as needed. For shortness of breath     amLODipine 10 MG tablet  Commonly known as:  NORVASC  Take 1 tablet (10 mg total) by mouth at bedtime.     aspirin EC 81 MG tablet  Take 81 mg by mouth daily.     calcitRIOL 0.25 MCG capsule  Commonly known as:  ROCALTROL  Take 1 capsule (0.25 mcg total) by mouth daily.     CENTRUM SILVER PO  Take 1 tablet by mouth daily.     enoxaparin 100 MG/ML injection  Commonly known as:  LOVENOX  Inject 0.9 mLs (90 mg total) into the skin daily.     folic acid 1 MG tablet  Commonly known as:  FOLVITE  Take 1 mg by mouth daily.     furosemide 80 MG tablet  Commonly known as:  LASIX  Take 1 tablet (80 mg total) by mouth 2 (two) times daily.     HYDROcodone-acetaminophen 5-325 MG per tablet  Commonly known as:  NORCO/VICODIN  Take 1-2 tablets by mouth every 4 (four) hours as needed for moderate pain.     hydroxychloroquine 200 MG tablet  Commonly known as:  PLAQUENIL  Take 200 mg by mouth 2 (two) times daily.     Insulin Detemir 100 UNIT/ML Pen  Commonly known as:  LEVEMIR FLEXPEN  Inject 20 Units into the skin daily at 10 pm.     metoprolol 50 MG tablet  Commonly known as:  LOPRESSOR  Take 1 tablet (50 mg total) by mouth 2 (two) times daily.      predniSONE 20 MG tablet  Commonly known as:  DELTASONE  Take 1 tablet (20 mg total) by mouth daily with breakfast.     simvastatin 80 MG tablet  Commonly known as:  ZOCOR  Take 80 mg by mouth daily.     Vitamin D (Ergocalciferol) 50000 UNITS Caps capsule  Commonly known as:  DRISDOL  Take 50,000 Units by mouth every 7 (seven) days. Tuesday.     warfarin 5 MG tablet  Commonly known as:  COUMADIN  Take 1 tablet (5 mg total) by mouth daily.     zolpidem 6.25 MG CR tablet  Commonly known as:  AMBIEN CR  Take 1 tablet (6.25 mg total) by mouth at bedtime as needed for sleep.       No Known Allergies Follow-up Information   Follow up with [facility]. (PT)    Contact information:   [address] [place] [address] [phone]       Follow up with Lucrezia Starch, MD On 04/04/2014. (@)    Specialty:  Nephrology   Contact information:   [place] [address] [phone]       Follow up with [facility] In 2 days. (hospital follow up)    Contact information:   [address] [address]        The results of significant diagnostics from this hospitalization (including imaging, microbiology, ancillary and laboratory) are listed below for reference.    Significant Diagnostic Studies: Dg Chest 2 View  03/16/2014   CLINICAL DATA:  Fever.  EXAM: CHEST  2 VIEW  COMPARISON:  March 15, 2014.  FINDINGS: Stable cardiomegaly. No pneumothorax or pleural effusion is noted. No acute pulmonary disease is noted. Bony thorax appears intact  IMPRESSION: No acute cardiopulmonary abnormality seen.   Electronically Signed   By: Sabino Dick M.D.   On: 03/16/2014 12:16   Dg Chest 2 View  03/13/2014   CLINICAL DATA:  Weakness.  End-stage renal disease.  EXAM: CHEST  2 VIEW  COMPARISON:  09/04/2012  FINDINGS: The heart is enlarged but stable. Stable tortuosity and calcification of the thoracic aorta. Mild vascular  congestion but no overt pulmonary edema. No pleural effusions or focal infiltrates. The bony thorax is intact.  IMPRESSION: Stable cardiac enlargement.  Mild vascular congestion but no infiltrates or effusions.   Electronically Signed   By: Kalman Jewels M.D.   On: 03/13/2014 09:55   US Renal  03/15/2014   CLINICAL DATA:  Assess for renal size, hypertension.  EXAM: RENAL/URINARY TRACT ULTRASOUND COMPLETE  COMPARISON:  CT abdomen and pelvis January 11, 2004  FINDINGS: Right Kidney:  Length: 11.9 cm. Echogenicity within normal limits. No hydronephrosis visualized. There is a 3.2 x 2.9 x 3 cm anechoic cyst in the upper pole right kidney.  Left Kidney:  Length: 13.2 cm. Echogenicity within normal limits. No mass visualized. There is dilated calyx in the midpole left kidney. There is no hydronephrosis.  Bladder:  Not visualized due to overlying bowel gas and patient's body habitus per ultrasound technologist.  IMPRESSION: Bilateral normal renal size. Focal dilated calyx in the midpole left kidney without hydronephrosis.   Electronically Signed   By: Abelardo Diesel M.D.   On: 03/15/2014 17:34   US Biopsy  03/20/2014   CLINICAL DATA:  Acute renal failure, proteinuria, history of lupus.  COMPARISON:  03/15/2014  TECHNIQUE: ULTRASOUND GUIDED RENAL CORE BIOPSY  The procedure, risks (including but not limited to bleeding, infection, organ damage ), benefits, and alternatives were explained to the patient. Questions regarding the procedure were encouraged and answered. The patient understands and consents to the procedure. Survey ultrasound was performed and an appropriate skin entry site was localized. Site was marked, prepped with Betadine, draped in usual sterile fashion, infiltrated locally with 1% lidocaine. Intravenous Fentanyl and Versed were administered as conscious sedation during continuous cardiorespiratory monitoring by the radiology RN with a total moderate sedation time of less than 30 minutes.  Under real  time ultrasound guidance, a 15 gauge trocar needle was advanced to the margin of the lower pole of the right kidney. A coaxial Bard core needle was used to obtain 3 16 gauge samples. The guide needle was removed. The core samples were submitted to pathology. The patient tolerated procedure well, with no immediate complication.  IMPRESSION: 1. Technically successful ultrasound-guided core renal biopsy , lower pole right kidney.   Electronically Signed   By: Arne Cleveland M.D.   On: 03/20/2014 16:18   Ir Fluoro Guide Cv Line Right  03/25/2014   CLINICAL DATA:  Renal failure  EXAM: RIGHT INTERNAL JUGULAR PICC LINE PLACEMENT WITH ULTRASOUND AND FLUOROSCOPIC GUIDANCE  FLUOROSCOPY TIME:  6 seconds.  PROCEDURE: The patient was advised of the possible risks andcomplications and agreed to undergo the procedure. The patient was then brought to the angiographic suite for the procedure.  The right neck was prepped with chlorhexidine, drapedin the usual sterile fashion using maximum barrier technique (cap and mask, sterile gown, sterile gloves, large sterile sheet, hand hygiene and cutaneous antisepsis) and infiltrated locally with 1% Lidocaine.  Ultrasound demonstrated patency of the right internal jugular vein, and this was documented with an image. Under real-time ultrasound guidance, this vein was accessed with a 21 gauge micropuncture needle and image documentation was performed. A 0.018 wire was introduced in to the vein. Over this, a 5 [place] double lumen Power PICC was advanced to the lower SVC/right atrial junction. Fluoroscopy during the procedure and fluoro spot radiograph  confirms appropriate catheter position. The catheter was flushed and covered with asterile dressing. Catheter length is 21 cm.  Complications: None  IMPRESSION: Successful right internal jugular Power PICC line placement with ultrasound and fluoroscopic guidance. The catheter is ready for use.   Electronically Signed   By: Maryclare Bean M.D.   On:  03/25/2014 12:39   Ir US Guide Vasc Access Right  03/25/2014   CLINICAL DATA:  Renal failure  EXAM: RIGHT INTERNAL JUGULAR PICC LINE PLACEMENT WITH ULTRASOUND AND FLUOROSCOPIC GUIDANCE  FLUOROSCOPY TIME:  6 seconds.  PROCEDURE: The patient was advised of the possible risks andcomplications and agreed to undergo the procedure. The patient was then brought to the angiographic suite for the procedure.  The right neck was prepped with chlorhexidine, drapedin the usual sterile fashion using maximum barrier technique (cap and mask, sterile gown, sterile gloves, large sterile sheet, hand hygiene and cutaneous antisepsis) and infiltrated locally with 1% Lidocaine.  Ultrasound demonstrated patency of the right internal jugular vein, and this was documented with an image. Under real-time ultrasound guidance, this vein was accessed with a 21 gauge micropuncture needle and image documentation was performed. A 0.018 wire was introduced in to the vein. Over this, a 5 [place] double lumen Power PICC was advanced to the lower SVC/right atrial junction. Fluoroscopy during the procedure and fluoro spot radiograph confirms appropriate catheter position. The catheter was flushed and covered with asterile dressing. Catheter length is 21 cm.  Complications: None  IMPRESSION: Successful right internal jugular Power PICC line placement with ultrasound and fluoroscopic guidance. The catheter is ready for use.   Electronically Signed   By: Maryclare Bean M.D.   On: 03/25/2014 12:39   Dg Chest Port 1 View  03/15/2014   CLINICAL DATA:  Short of breath.  EXAM: PORTABLE CHEST - 1 VIEW  COMPARISON:  03/14/2014  FINDINGS: Mild enlargement of the cardiopericardial silhouette, stable. Central vascular congestion and hazy perihilar airspace opacity as well as central interstitial thickening is stable consistent with congestive heart failure and pulmonary edema.  No pneumothorax.  IMPRESSION: No change from the prior study. Findings remain consistent  with mild congestive heart failure.   Electronically Signed   By: Lajean Manes M.D.   On: 03/15/2014 10:16   Dg Chest Port 1 View  03/14/2014   CLINICAL DATA:  Shortness of breath.  EXAM: PORTABLE CHEST - 1 VIEW  COMPARISON:  03/13/2014  FINDINGS: Chronic cardiopericardial enlargement. There is vascular pedicle widening. There is newly cephalized blood flow and interstitial indistinctness. No asymmetric opacity, definitive effusion, or pneumothorax.  IMPRESSION: CHF.   Electronically Signed   By: Jorje Guild M.D.   On: 03/14/2014 17:55    Microbiology: Recent Results (from the past 240 hour(s))  SURGICAL PCR SCREEN     Status: None   Collection Time    03/26/14  7:15 PM      Result Value Ref Range Status   MRSA, PCR NEGATIVE  NEGATIVE Final   Staphylococcus aureus NEGATIVE  NEGATIVE Final   Comment:            The Xpert SA Assay (FDA     approved for NASAL specimens     in patients over 56 years of age),     is one component of     a comprehensive surveillance     program.  Test performance has     been validated by [employer] for patients greater  than or equal to 72 year old.     It is not intended     to diagnose infection nor to     guide or monitor treatment.     Labs: Basic Metabolic Panel:  Recent Labs Lab 03/24/14 0510 03/25/14 0459 03/26/14 0603 03/27/14 0445 03/28/14 0629  NA 137 139 140 139 139  K 3.5* 3.3* 3.4* 3.5* 3.4*  CL 95* 97 97 96 96  CO2 _0 32 31  GLUCOSE 220* 264* 96 120* 111*  BUN 107* 107* 102* 93* 86*  CREATININE 3.17* 3.09* 2.78* 2.55* 2.54*  CALCIUM 9.0 8.6 8.4 8.3* 8.2*  PHOS 5.5* 5.0* 4.1 4.3 4.3   Liver Function Tests:  Recent Labs Lab 03/23/14 0337 03/24/14 0510 03/25/14 0459 03/26/14 0603 03/28/14 0629  ALBUMIN 1.9* 2.0* 1.9* 1.9* 1.9*   No results found for this basename: LIPASE, AMYLASE,  in the last 168 hours No results found for this basename: AMMONIA,  in the last 168 hours CBC:  Recent Labs Lab  03/23/14 0337 03/27/14 0445  WBC 9.1 7.4  HGB 10.4* 9.4*  HCT 30.1* 29.3*  MCV 82.0 86.4  PLT 143* 90*   Cardiac Enzymes: No results found for this basename: CKTOTAL, CKMB, CKMBINDEX, TROPONINI,  in the last 168 hours BNP: BNP (last 3 results)  Recent Labs  03/15/14 0551  PROBNP 33778.0*   CBG:  Recent Labs Lab 03/27/14 1128 03/27/14 1342 03/27/14 1615 03/27/14 2133 03/28/14 0551  GLUCAP 148* 179* 230* 253* 112*       Signed:  [name]  [facility] 03/28/2014, 10:16 AM

## 2014-03-28 NOTE — Discharge Instructions (Signed)
° ° °  03/28/2014 [name] [id number] [birth year]  Surgeon(s): Rosetta Posner, MD  Procedure(s): 1st stage basilic vein transposition  x Do not stick graft for 12 weeks

## 2014-03-28 NOTE — Telephone Encounter (Addendum)
Message copied by Gena Fray on Tue Mar 28, 2014 11:01 AM ------      Message from: Peter Minium K      Created: Mon Mar 27, 2014  3:23 PM      Regarding: Schedule                   ----- Message -----         From: Rosetta Posner, MD         Sent: 03/27/2014   1:42 PM           To: [facility] Charge Pool            I picked up the this case per Dr. Oneida Alar. Ms. Pistone first stage of basilic vein transposition left arm. I will need to see her in one month with duplex of her fistula. Assistant Collins ------  03/28/14: no answer, no vm, mailed letter, dpm

## 2014-03-28 NOTE — Progress Notes (Signed)
Patient ID: [name], female   DOB: [birth year], 68 y.o.   MRN: [id number] Comfortable this morning. Dressing removed from her upper arm fistula. No hematoma. Hand well perfused. No evidence of steal. Palpable thrill above the antecubital space. We will not follow actively. Please call if we can assist. I will see her in the office in one month's to plan the second stage of her basilic vein transposition

## 2014-03-28 NOTE — Progress Notes (Signed)
PT Cancellation Note  Patient Details Name: [name] MRN: [id number] DOB: 1946/04/01   Cancelled Treatment:    Reason Eval/Treat Not Completed: Other (comment); patient reports discharging home today (RN in room and confirms.)  Agreed to let patient rest to allow strength for d/c home and to anticipate HHPT to start potentially tomorrow.   WYNN,CYNDI 03/28/2014, 10:12 AM

## 2014-03-28 NOTE — Progress Notes (Signed)
Pt co-pay for Lovenox will be approximately $35 fo90mg  daily for 5 days.  Information relayed to pt. Madsen Riddle J. Clydene Laming, [place], [place], [employer] [phone]

## 2014-03-28 NOTE — Telephone Encounter (Addendum)
Message copied by Gena Fray on Tue Mar 28, 2014 10:59 AM ------      Message from: Peter Minium K      Created: Mon Mar 27, 2014  3:23 PM      Regarding: Schedule                   ----- Message -----         From: Rosetta Posner, MD         Sent: 03/27/2014   1:42 PM           To: [facility] Charge Pool            I picked up the this case per Dr. Oneida Alar. [name] first stage of basilic vein transposition left arm. I will need to see her in one month with duplex of her fistula. Assistant Collins ------  03/28/14: no answer, no vm- dpm

## 2014-03-28 NOTE — Progress Notes (Signed)
  [facility] [place] Postoperative hemodialysis access   Date of Surgery:  03/27/14 Surgeon: Early  Subjective:  States she is a little sore at the incision site  PHYSICAL EXAMINATION:  Filed Vitals:   03/28/14 1005  BP: 146/64  Pulse: 94  Temp: 97.8 F (36.6 C)  Resp: 18    Incision is with steri strips in place Hand grip is strong  Sensation in digits is intact;  There is  Thrill  There is bruit. The graft/fistula is not palpable    ASSESSMENT/PLAN:  [name] is a 68 y.o. year old female who is s/p 1st stage left BVT.  -graft is patent -pt does not have evidence of steal sx -f/u with Dr. Donnetta Hutching in 4-6 weeks to check maturation of AVF -will sign off-call as needed.   Leontine Locket, PA-C [facility] [phone]

## 2014-03-28 NOTE — Progress Notes (Signed)
Pt discharged home. Discharge teaching completed. Medication instructions given to pt and husband. Central line discontinued. Pt verbalized understanding.  Transported via wheelchair to 2nd floor.

## 2014-03-28 NOTE — Progress Notes (Signed)
Patient ID: [name], female   DOB: 1946-05-12, 68 y.o.   MRN: [id number] S:No new complaints O:BP 146/64  Pulse 94  Temp(Src) 97.8 F (36.6 C) (Oral)  Resp 18  Ht 5\' 5"  (1.651 m)  Wt 90.674 kg (199 lb 14.4 oz)  BMI 33.27 kg/m2  SpO2 100%  Intake/Output Summary (Last 24 hours) at 03/28/14 1524 Last data filed at 03/28/14 1353  Gross per 24 hour  Intake   1580 ml  Output   1400 ml  Net    180 ml   Intake/Output: I/O last 3 completed shifts: In: 1440 [P.O.:1320; I.V.:120] Out: 2950 [Urine:2950]  Intake/Output this shift:  Total I/O In: 240 [P.O.:240] Out: 175 [Urine:175] Weight change:  Gen:WD WN AAF in NAd CVS:no rub Resp:cta [id number] Ext:LUE AVF +T/B, tr pretib edema   Recent Labs Lab 03/22/14 0339 03/23/14 0337 03/24/14 0510 03/25/14 0459 03/26/14 0603 03/27/14 0445 03/28/14 0629  NA 138 138 137 139 140 139 139  K 3.3* 3.1* 3.5* 3.3* 3.4* 3.5* 3.4*  CL 99 97 95* 97 97 96 96  CO2 23 24 23 26 31  32 31  GLUCOSE 157* 151* 220* 264* 96 120* 111*  BUN 93* 98* 107* 107* 102* 93* 86*  CREATININE 3.09* 3.26* 3.17* 3.09* 2.78* 2.55* 2.54*  ALBUMIN 1.9* 1.9* 2.0* 1.9* 1.9*  --  1.9*  CALCIUM 8.8 9.1 9.0 8.6 8.4 8.3* 8.2*  PHOS 4.5 5.1* 5.5* 5.0* 4.1 4.3 4.3   Liver Function Tests:  Recent Labs Lab 03/25/14 0459 03/26/14 0603 03/28/14 0629  ALBUMIN 1.9* 1.9* 1.9*   No results found for this basename: LIPASE, AMYLASE,  in the last 168 hours No results found for this basename: AMMONIA,  in the last 168 hours CBC:  Recent Labs Lab 03/23/14 0337 03/27/14 0445  WBC 9.1 7.4  HGB 10.4* 9.4*  HCT 30.1* 29.3*  MCV 82.0 86.4  PLT 143* 90*   Cardiac Enzymes: No results found for this basename: CKTOTAL, CKMB, CKMBINDEX, TROPONINI,  in the last 168 hours CBG:  Recent Labs Lab 03/27/14 1342 03/27/14 1615 03/27/14 2133 03/28/14 0551 03/28/14 1104  GLUCAP 179* 230* 253* 112* 173*    Iron Studies: No results found for this basename: IRON,  TIBC, TRANSFERRIN, FERRITIN,  in the last 72 hours Studies/Results: No results found. Marland Kitchen albuterol  3 mL Inhalation BID  . amLODipine  10 mg Oral QHS  . antiseptic oral rinse  15 mL Mouth Rinse q12n4p  . aspirin EC  81 mg Oral Daily  . atorvastatin  40 mg Oral q1800  . calcitRIOL  0.25 mcg Oral Daily  . chlorhexidine  15 mL Mouth Rinse BID  . darbepoetin (ARANESP) injection - NON-DIALYSIS  200 mcg Subcutaneous Q Sat-1800  . enoxaparin (LOVENOX) injection  90 mg Subcutaneous Q24H  . folic acid  1 mg Oral Daily  . furosemide  80 mg Oral BID  . hydroxychloroquine  200 mg Oral BID  . insulin aspart  0-20 Units Subcutaneous TID WC  . insulin aspart  0-5 Units Subcutaneous QHS  . insulin aspart  2 Units Subcutaneous TID WC  . insulin detemir  15 Units Subcutaneous BID  . metoprolol  50 mg Oral BID  . potassium chloride  40 mEq Oral BID  . predniSONE  20 mg Oral Q breakfast  . sodium chloride  3 mL Intravenous Q12H  . Warfarin - Pharmacist Dosing Inpatient   Does not apply q1800    BMET    Component Value  Date/Time   NA 139 03/28/2014 0629   K 3.4* 03/28/2014 0629   CL 96 03/28/2014 0629   CO2 31 03/28/2014 0629   GLUCOSE 111* 03/28/2014 0629   BUN 86* 03/28/2014 0629   CREATININE 2.54* 03/28/2014 0629   CALCIUM 8.2* 03/28/2014 0629   CALCIUM 7.0* 03/16/2014 0835   GFRNONAA 18* 03/28/2014 0629   GFRAA 21* 03/28/2014 0629   CBC    Component Value Date/Time   WBC 7.4 03/27/2014 0445   RBC 3.39* 03/27/2014 0445   RBC 2.63* 02/16/2014 1400   HGB 9.4* 03/27/2014 0445   HCT 29.3* 03/27/2014 0445   PLT 90* 03/27/2014 0445   MCV 86.4 03/27/2014 0445   MCH 27.7 03/27/2014 0445   MCHC 32.1 03/27/2014 0445   RDW 19.7* 03/27/2014 0445   LYMPHSABS 1.1 03/15/2014 0551   MONOABS 1.2* 03/15/2014 0551   EOSABS 0.0 03/15/2014 0551   BASOSABS 0.0 03/15/2014 0551     Assessment/Plan:  1. AKI/CKD- renal biopsy revealed collapsing FSG with severe arteriosclerosis and moterate to severe tubulointerstitial  scarring. Poor prognosis and agree with stopping immunosuppressive medications as risks outweigh the benefits. Agree with proceeding with AVF/AVG placement as there is not indication to initiate dialysis at this time, however given the biopsy results, RRT is likely to be required in the near future. Cont with education and preparation. 1. Stable for discharge and will be seen at our office next week in follow up. 2. Discussed the ways to delay the progression of CKD with BP control (goal <130/80), glucose control (goal HgbA1c <7%), and use of ACE/ARB (on hold due to AKI), and avoidance of NSAIDs/Cox-II I's. 2. Vascular access- s/p LUE AVF placed 03/27/14 by Dr. Donnetta Hutching, +T/B 3. SLE- on pred/plaquenil and hold MTX  4. H/o A flutter- stable 5. DM- per primary 6. HTN- hold off on ACE/ARB at this time given AKI/CKD. Cont with amlodipine and metoprolol for now. 7. SHPTH- on calcitriol 0.25mg  daily 8. Metabolic acidosis- resolved. Off of bicarb. 9. DVT- on lovenox and coumadin to start after surgery. 10. Dispo- d/c to home with f/u with her PCP and Dr. Moshe Cipro as an outpt.  Renfrow A

## 2014-03-29 ENCOUNTER — Other Ambulatory Visit (HOSPITAL_COMMUNITY): Payer: Medicare Other

## 2014-03-29 ENCOUNTER — Ambulatory Visit (HOSPITAL_COMMUNITY): Payer: Medicare Other

## 2014-03-30 ENCOUNTER — Encounter (HOSPITAL_COMMUNITY): Payer: Self-pay

## 2014-03-30 ENCOUNTER — Ambulatory Visit (INDEPENDENT_AMBULATORY_CARE_PROVIDER_SITE_OTHER): Payer: Medicare Other | Admitting: *Deleted

## 2014-03-30 DIAGNOSIS — Z5181 Encounter for therapeutic drug level monitoring: Secondary | ICD-10-CM

## 2014-03-30 DIAGNOSIS — I4892 Unspecified atrial flutter: Secondary | ICD-10-CM

## 2014-03-30 DIAGNOSIS — Z7189 Other specified counseling: Secondary | ICD-10-CM | POA: Insufficient documentation

## 2014-03-30 LAB — POCT INR: INR: 1.2

## 2014-04-03 ENCOUNTER — Ambulatory Visit (INDEPENDENT_AMBULATORY_CARE_PROVIDER_SITE_OTHER): Payer: Medicare Other | Admitting: *Deleted

## 2014-04-03 DIAGNOSIS — I4892 Unspecified atrial flutter: Secondary | ICD-10-CM

## 2014-04-03 DIAGNOSIS — Z5181 Encounter for therapeutic drug level monitoring: Secondary | ICD-10-CM

## 2014-04-03 LAB — POCT INR: INR: 1.5

## 2014-04-03 MED ORDER — ENOXAPARIN SODIUM 100 MG/ML ~~LOC~~ SOLN: 90.0000 mg | SUBCUTANEOUS | Status: DC

## 2014-04-06 ENCOUNTER — Encounter (HOSPITAL_COMMUNITY): Payer: Medicare Other | Attending: Hematology and Oncology

## 2014-04-06 ENCOUNTER — Encounter (HOSPITAL_COMMUNITY): Payer: Self-pay

## 2014-04-06 ENCOUNTER — Encounter (HOSPITAL_COMMUNITY): Payer: Medicare Other

## 2014-04-06 ENCOUNTER — Encounter (HOSPITAL_BASED_OUTPATIENT_CLINIC_OR_DEPARTMENT_OTHER): Payer: Medicare Other

## 2014-04-06 ENCOUNTER — Telehealth: Payer: Self-pay | Admitting: *Deleted

## 2014-04-06 VITALS — BP 129/43 | HR 68 | Temp 97.2°F | Resp 20 | Wt 201.1 lb

## 2014-04-06 DIAGNOSIS — N189 Chronic kidney disease, unspecified: Secondary | ICD-10-CM

## 2014-04-06 DIAGNOSIS — N058 Unspecified nephritic syndrome with other morphologic changes: Secondary | ICD-10-CM | POA: Diagnosis present

## 2014-04-06 DIAGNOSIS — D631 Anemia in chronic kidney disease: Secondary | ICD-10-CM

## 2014-04-06 DIAGNOSIS — D638 Anemia in other chronic diseases classified elsewhere: Secondary | ICD-10-CM

## 2014-04-06 DIAGNOSIS — J438 Other emphysema: Secondary | ICD-10-CM

## 2014-04-06 DIAGNOSIS — M329 Systemic lupus erythematosus, unspecified: Secondary | ICD-10-CM

## 2014-04-06 DIAGNOSIS — N039 Chronic nephritic syndrome with unspecified morphologic changes: Principal | ICD-10-CM

## 2014-04-06 LAB — CBC
HCT: 30.5 % — ABNORMAL LOW (ref 36.0–46.0)
Hemoglobin: 9.8 g/dL — ABNORMAL LOW (ref 12.0–15.0)
MCH: 29.4 pg (ref 26.0–34.0)
MCHC: 32.1 g/dL (ref 30.0–36.0)
MCV: 91.6 fL (ref 78.0–100.0)
Platelets: 173 K/uL (ref 150–400)
RBC: 3.33 MIL/uL — ABNORMAL LOW (ref 3.87–5.11)
RDW: 19.2 % — ABNORMAL HIGH (ref 11.5–15.5)
WBC: 7.4 K/uL (ref 4.0–10.5)

## 2014-04-06 LAB — FERRITIN: Ferritin: 1886 ng/mL — ABNORMAL HIGH (ref 10–291)

## 2014-04-06 LAB — POCT INR: INR: 2.2

## 2014-04-06 MED ORDER — EPOETIN ALFA 40000 UNIT/ML IJ SOLN: 40000.0000 [IU] | Freq: Once | INTRAMUSCULAR | Status: DC

## 2014-04-06 MED ORDER — EPOETIN ALFA 40000 UNIT/ML IJ SOLN
INTRAMUSCULAR | Status: AC
  Filled 2014-04-06: qty 1

## 2014-04-06 MED ORDER — DARBEPOETIN ALFA-POLYSORBATE 500 MCG/ML IJ SOLN
500.0000 ug | Freq: Once | INTRAMUSCULAR | Status: AC
  Administered 2014-04-06: 500 ug via SUBCUTANEOUS

## 2014-04-06 MED ORDER — DARBEPOETIN ALFA-POLYSORBATE 500 MCG/ML IJ SOLN
INTRAMUSCULAR | Status: AC
  Filled 2014-04-06: qty 1

## 2014-04-06 NOTE — Progress Notes (Signed)
Labs drawn for ferr,cbc.

## 2014-04-06 NOTE — Progress Notes (Signed)
[name] recieved aranesp  500 mcg today

## 2014-04-06 NOTE — Patient Instructions (Signed)
[facility] Discharge Instructions  RECOMMENDATIONS MADE BY THE CONSULTANT AND ANY TEST RESULTS WILL BE SENT TO YOUR REFERRING PHYSICIAN.  EXAM FINDINGS BY THE PHYSICIAN TODAY AND SIGNS OR SYMPTOMS TO REPORT TO CLINIC OR PRIMARY PHYSICIAN: You seen Dr Barnet Glasgow today  Follow up in 1 month with doctor and labs.  Contact vascular surgeon about fistula.  Thank you for choosing [facility] to provide your oncology and hematology care.  To afford each patient quality time with our providers, please arrive at least 15 minutes before your scheduled appointment time.  With your help, our goal is to use those 15 minutes to complete the necessary work-up to ensure our physicians have the information they need to help with your evaluation and healthcare recommendations.    Effective January 1st, 2014, we ask that you re-schedule your appointment with our physicians should you arrive 10 or more minutes late for your appointment.  We strive to give you quality time with our providers, and arriving late affects you and other patients whose appointments are after yours.    Again, thank you for choosing [facility].  Our hope is that these requests will decrease the amount of time that you wait before being seen by our physicians.       _____________________________________________________________  Should you have questions after your visit to [facility], please contact our office at [phone] between the hours of 8:30 a.m. and 5:00 p.m.  Voicemails left after 4:30 p.m. will not be returned until the following business day.  For prescription refill requests, have your pharmacy contact our office with your prescription refill request.

## 2014-04-06 NOTE — Progress Notes (Signed)
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 or chronic glomerulonephritis - Plan: ARANESP TREATMENT CONDITION, SCHEDULING COMMUNICATION INJECTION, darbepoetin alfa-polysorbate (ARANESP) injection 500 mcg, SCHEDULING COMMUNICATION LAB, CBC  Anemia in chronic renal disease - Plan: SCHEDULING COMMUNICATION INJECTION, PROCRIT TREATMENT CONDITION, SCHEDULING COMMUNICATION LAB, CBC, ARANESP TREATMENT CONDITION, SCHEDULING COMMUNICATION INJECTION, darbepoetin alfa-polysorbate (ARANESP) injection 500 mcg, SCHEDULING COMMUNICATION LAB, CBC, DISCONTINUED: epoetin alfa (EPOGEN,PROCRIT) injection 40,000 Units  Other emphysema  Chief Complaint  Patient presents with   Anemia of chronic disease   Lupus   Focal and segmental glomerulosclerosis    CURRENT THERAPY: Aranesp 200 mcg subcutaneously on 03/18/2014 and 03/25/2014 having received intravenous Feraheme on 03/20/2014 for anemia of chronic disease in the setting of systemic lupus erythematosus and chronic renal failure. She was started on Lovenox on 04/03/2014 along with warfarin 5 mg daily for DVT.  INTERVAL HISTORY: [name] 68 y.o. female returns for followup after initiation of Aranesp therapy for anemia of chronic disease in association with lupus erythematosus and treatment with placquenil and meloxicam in addition to iron deficiency. She had received intravenous Feraheme and has had 2 doses of Aranesp since her last visit. On 03/20/2014 the patient underwent renal biopsy showing focal and segmental glomerulosclerosis after which methotrexate was discontinued. She had a fistula constructed in the left upper extremity. She had developed a thrombus in the right upper extremity and for that reason was anticoagulated. She denies any worsening joint pain. She denies any hematuria  grossly or worsening lower 70 swelling or redness. Appetite has been good with no nausea, vomiting, diarrhea, constipation, melena, hematochezia, epistaxis, or mop this is. She's had considerable ecchymoses involving both upper extremities and has numbness involving the left hand.  MEDICAL HISTORY: Past Medical History  Diagnosis Date   Asthma    Essential hypertension, benign    Type 2 diabetes mellitus    SLE (systemic lupus erythematosus)    Anemia    Atrial flutter     TEE/DCCV December 2013 - on Xarelto [facility])   CKD (chronic kidney disease) stage 3, GFR 30-59 ml/min     INTERIM HISTORY: has Atrial flutter; Morbid obesity; Type 2 diabetes mellitus with diabetic neuropathy; SLE (systemic lupus erythematosus); Essential hypertension, benign; COPD (chronic obstructive pulmonary disease); CKD (chronic kidney disease) stage 3, GFR 30-59 ml/min; Aortic regurgitation; Shortness of breath; Carotid bruit; Folate deficiency; Anemia in chronic renal disease; Renal failure (ARF), acute on chronic; Dehydration; Hyponatremia; Acute on chronic renal failure; Dyspnea; Diastolic CHF, acute on chronic; Acute respiratory failure with hypoxia; Fever, unspecified; Acute on chronic diastolic congestive heart failure; Encounter for therapeutic drug monitoring; and Focal segmental glomerulosclerosis 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   Assessment/Plan: (Dr. Donato Heinz) 03/28/2014 1. AKI/CKD- renal biopsy revealed collapsing FSG with severe arteriosclerosis and moterate to severe tubulointerstitial scarring. Poor prognosis and agree with stopping immunosuppressive medications as risks outweigh the benefits. Agree with proceeding with AVF/AVG placement as there is not indication to initiate dialysis at this time, however given the biopsy results, RRT is likely to be required in the near future. Cont with education and preparation.  1. Stable for discharge and will be seen  at our office next week in follow up. 2. Discussed the ways to delay the progression of CKD with BP control (goal <130/80), glucose control (goal HgbA1c <7%), and use of  ACE/ARB (on hold due to AKI), and avoidance of NSAIDs/Cox-II I's. 2. Vascular access- s/p LUE AVF placed 03/27/14 by Dr. Donnetta Hutching, +T/B 3. SLE- on pred/plaquenil and hold MTX  4. H/o A flutter- stable 5. DM- per primary 6. HTN- hold off on ACE/ARB at this time given AKI/CKD. Cont with amlodipine and metoprolol for now. 7. SHPTH- on calcitriol 0.25mg  daily 8. Metabolic acidosis- resolved. Off of bicarb. 9. DVT- on lovenox and coumadin to start after surgery. 10. Dispo- d/c to home with f/u with her PCP and Dr. Moshe Cipro as an OP   ALLERGIES:  has No Known Allergies.  MEDICATIONS: has a current medication list which includes the following prescription(s): acetaminophen, albuterol, amlodipine, aspirin ec, calcitriol, enoxaparin, folic acid, furosemide, hydrocodone-acetaminophen, hydroxychloroquine, insulin detemir, metoprolol, multiple vitamins-minerals, prednisone, simvastatin, vitamin d (ergocalciferol), warfarin, and zolpidem.  SURGICAL HISTORY:  Past Surgical History  Procedure Laterality Date   Ankle surgery  1993   Back surgery  1980   Abdominal hysterectomy  1983   Tee without cardioversion  09/16/2012    Procedure: TRANSESOPHAGEAL ECHOCARDIOGRAM (TEE);  Surgeon: Sanda Klein, MD;  Location: [place];  Service: Cardiovascular;  Laterality: N/A;   Cardioversion  09/16/2012    Procedure: CARDIOVERSION;  Surgeon: Sanda Klein, MD;  Location: [facility] ENDOSCOPY;  Service: Cardiovascular;  Laterality: N/A;   Esophagogastroduodenoscopy  09/20/2012    Procedure: ESOPHAGOGASTRODUODENOSCOPY (EGD);  Surgeon: Cleotis Nipper, MD;  Location: [facility] ENDOSCOPY;  Service: Endoscopy;  Laterality: Left;   Colonoscopy  09/20/2012    Procedure: COLONOSCOPY;  Surgeon: Cleotis Nipper, MD;  Location: [facility] ENDOSCOPY;  Service:  Endoscopy;  Laterality: Left;   Av fistula placement Left 03/27/2014    Procedure: ARTERIOVENOUS (AV) FISTULA CREATION;  Surgeon: Rosetta Posner, MD;  Location: [facility] OR;  Service: Vascular;  Laterality: Left;    FAMILY HISTORY: family history includes Diabetes in her brother, father, and sister; Hypertension in her brother, father, and sister; Stroke in her mother.  SOCIAL HISTORY:  reports that she has quit smoking. Her smoking use included Cigarettes. She has a 30 pack-year smoking history. She does not have any smokeless tobacco history on file. She reports that she does not drink alcohol or use illicit drugs.  REVIEW OF SYSTEMS:  Other than that discussed above is noncontributory.  PHYSICAL EXAMINATION: ECOG PERFORMANCE STATUS: 1 - Symptomatic but completely ambulatory  Blood pressure 129/43, pulse 68, temperature 97.2 F (36.2 C), temperature source Oral, resp. rate 20, weight 201 lb 1.6 oz (91.218 kg).  GENERAL:alert, no distress and comfortable. Morbidly obese. SKIN: skin color, texture, turgor are normal, no rashes or significant lesions EYES: PERLA; Conjunctiva are pink and non-injected, sclera clear SINUSES: No redness or tenderness over maxillary or ethmoid sinuses OROPHARYNX:no exudate, no erythema on lips, buccal mucosa, or tongue. NECK: supple, thyroid normal size, non-tender, without nodularity. No masses CHEST: Increased AP diameter with no breast masses. LYMPH:  no palpable lymphadenopathy in the cervical, axillary or inguinal LUNGS: clear to auscultation and percussion with normal breathing effort HEART: regular rate & rhythm and no murmurs. No friction rub. ABDOMEN:abdomen soft, non-tender and normal bowel sounds. Obese with no organomegaly, ascites, or CVA tenderness. MUSCULOSKELETAL:no cyanosis of digits and no clubbing. Range of motion normal. Left upper extremity fistula bruit is not appreciated.  NEURO: alert & oriented x 3 with fluent speech, no focal motor/sensory  deficits   LABORATORY DATA: Lab on 04/06/2014  Component Date Value Ref Range Status   WBC 04/06/2014 7.4  4.0 - 10.5 K/uL  Final   RBC 04/06/2014 3.33* 3.87 - 5.11 MIL/uL Final   Hemoglobin 04/06/2014 9.8* 12.0 - 15.0 g/dL Final   HCT 04/06/2014 30.5* 36.0 - 46.0 % Final   MCV 04/06/2014 91.6  78.0 - 100.0 fL Final   MCH 04/06/2014 29.4  26.0 - 34.0 pg Final   MCHC 04/06/2014 32.1  30.0 - 36.0 g/dL Final   RDW 04/06/2014 19.2* 11.5 - 15.5 % Final   Platelets 04/06/2014 173  150 - 400 K/uL Final  Anti-coag visit on 04/03/2014  Component Date Value Ref Range Status   INR 04/03/2014 1.5   Final  Anti-coag visit on 03/30/2014  Component Date Value Ref Range Status   INR 03/30/2014 1.2   Final  Admission on 03/13/2014, Discharged on 03/28/2014  No results displayed because visit has over 200 results.    Infusion on 03/08/2014  Component Date Value Ref Range Status   WBC 03/08/2014 3.0* 4.0 - 10.5 K/uL Final   RBC 03/08/2014 2.80* 3.87 - 5.11 MIL/uL Final   Hemoglobin 03/08/2014 8.0* 12.0 - 15.0 g/dL Final   HCT 03/08/2014 24.0* 36.0 - 46.0 % Final   MCV 03/08/2014 85.7  78.0 - 100.0 fL Final   MCH 03/08/2014 28.6  26.0 - 34.0 pg Final   MCHC 03/08/2014 33.3  30.0 - 36.0 g/dL Final   RDW 03/08/2014 16.4* 11.5 - 15.5 % Final   Platelets 03/08/2014 168  150 - 400 K/uL Final   Neutrophils Relative % 03/08/2014 85* 43 - 77 % Final   Neutro Abs 03/08/2014 2.6  1.7 - 7.7 K/uL Final   Lymphocytes Relative 03/08/2014 13  12 - 46 % Final   Lymphs Abs 03/08/2014 0.4* 0.7 - 4.0 K/uL Final   Monocytes Relative 03/08/2014 2* 3 - 12 % Final   Monocytes Absolute 03/08/2014 0.1  0.1 - 1.0 K/uL Final   Eosinophils Relative 03/08/2014 0  0 - 5 % Final   Eosinophils Absolute 03/08/2014 0.0  0.0 - 0.7 K/uL Final   Basophils Relative 03/08/2014 0  0 - 1 % Final   Basophils Absolute 03/08/2014 0.0  0.0 - 0.1 K/uL Final    PATHOLOGY:  FINAL for BRITTENEY, GRAMBO  [id number]) Patient: ORI, EHRESMAN Collected: 03/20/2014 Client: [facility] Accession: [id number] Received: 03/20/2014 D. Arne Cleveland DOB: 1945/12/04 Age: 45 Gender: F Reported: 03/28/2014 [address] Patient Ph: [phone] MRN #: [id number] [place], [place] [address] Visit #: [id number] Chart #: Phone:  Fax: CC: Barton Dubois, MD REPORT OF SURGICAL PATHOLOGY FINAL DIAGNOSIS Diagnosis Kidney, biopsy w/ EM l KIDNEY, NEEDLE BIOPSY: COLLAPSING VARIANT OF FOCAL AND SEGMENTAL GLOMERULOSCLEROSIS IN ASSOCIATION WITH DIABETIC GLOMERULOSCLEROSIS. SEVERE ARTERIOSCLEROSIS WITH MODERATE TO SEVERE TUBULOINTERSTITIAL SCARRING AND SUPERIMPOSED ACUTE TUBULAR INJURY. Diagnosis Note Diagnostic evaluation conducted by Harsharan K. Singh,MD @ [employer]; [id number] Claudette Laws MD Pathologist, Electronic Signature (Case signed 03/28/2014) Specimen Gross and Clinical Information Specimen(s) Obtained: Kidney, biopsy w/ EM l Specimen Clinical Information [place] RLP renal core (tl) Gross Received in saline are four cores of tan-pink soft tissue, which range from 0.5 x 0.1 cm to 1.6 x 0.1 cm. Two smaller cores are submitted in formalin for EM studies, one core is placed in Michel's media, and the remaining cores submitted in formalin for light microscopy. The entire specimen is sent to the [facility], Department of Pathology at the [facility] in [place]. ([place] 03/20/2014) Report signed out from the following location(s) Technical Component and Interpretation performed at [place]  [facility] [place], [place], [place] [address]. CLIA #: [id number],    Urinalysis    Component Value Date/Time   COLORURINE AMBER* 03/17/2014 1830   APPEARANCEUR CLOUDY* 03/17/2014 1830   LABSPEC 1.023 03/17/2014 1830   PHURINE 5.0 03/17/2014 1830   GLUCOSEU NEGATIVE 03/17/2014 1830   HGBUR MODERATE* 03/17/2014 1830   BILIRUBINUR  SMALL* 03/17/2014 1830   KETONESUR NEGATIVE 03/17/2014 1830   PROTEINUR >300* 03/17/2014 1830   UROBILINOGEN 1.0 03/17/2014 1830   NITRITE NEGATIVE 03/17/2014 1830   LEUKOCYTESUR LARGE* 03/17/2014 1830    RADIOGRAPHIC STUDIES:     2D Echocardiogram without contrast     Ordering Physician: Radene Gunning, NP  Order# [id number] Study Date: 03/14/14             Result Narrative      *[place], [place] [address] [id number]  ------------------------------------------------------------------- Transthoracic Echocardiography 03/14/2014  Patient: Jennessy, Charnock MR #: [id number] Study Date: 03/14/2014 Gender: F Age: 48 Height: 165.1 cm Weight: 92.5 kg BSA: 2.1 m^2 Pt. Status: Room: A301  ATTENDING Maudry Diego SONOGRAPHER Lina Sar, RDCS ADMITTING [place], Rae Lips [id number] Cherylann Parr, Wandra Feinstein [place], Santiago Glad PERFORMING ChmgDeneise Lever [place]  cc:  ------------------------------------------------------------------- LV EF: 50% - 55%  ------------------------------------------------------------------- Indications: Dyspnea 786.09.  ------------------------------------------------------------------- History: PMH: Atrial flutter. Chronic obstructive pulmonary disease. Risk factors: Chronic anemia, CKD stage 3, systemic lupus erythematous. Former tobacco use. Hypertension. Diabetes mellitus. Morbidly obese.  ------------------------------------------------------------------- Study Conclusions  - Left ventricle: The cavity size was normal. Wall thickness was increased increased in a pattern of mild to moderate LVH. Systolic function was normal. The estimated ejection fraction was in the range of 50% to 55%. Doppler parameters are consistent with an irreversible restrictive pattern, indicative of decreased left ventricular diastolic compliance and/or increased left atrial pressure (grade 4 diastolic dysfunction). - Aortic valve:  Mildly calcified annulus. Trileaflet; mildly thickened leaflets. There was moderate regurgitation. Regurgitation pressure half-time: 324 ms. - Aortic root: The aortic root was normal in size. - Mitral valve: Mildly calcified annulus. Moderately thickened leaflets . There was mild regurgitation. - Left atrium: The atrium was moderately dilated. - Pulmonary veins: There is systolic blunting of pulmonary vein flow suggestive of elevated LA pressure. - Right ventricle: The cavity size was mildly to moderately dilated. - Right atrium: The atrium was moderately dilated. - Tricuspid valve: There was moderate-severe regurgitation. The jet is eccentric and posterior, this may lead to underestimation of severity. The TR vena contracta measures 0.4 cm. There is systolic reversal of hepatic vein flow suggestive of severe TR. The morphological etiology of the TR is unclear from available images. - Pulmonary arteries: Systolic pressure was moderately increased. PA peak pressure: 61 mm Hg (S). Pulmonary pressure may be underestimated in the setting of significant TR. - Inferior vena cava: The vessel was dilated. The respirophasic diameter changes were blunted (< 50%), consistent with elevated central venous pressure. - Technically adequate study.  ------------------------------------------------------------------- Labs, prior tests, procedures, and surgery: A-flutter s/p cardioversion 08/2012. Transthoracic echocardiography. M-mode, complete 2D, spectral Doppler, and color Doppler. Birthdate: Patient birthdate: 04-03-1946. Age: Patient is 68 yr old. Sex: Gender: female. Height: Height: 165.1 cm. Height: 65 in. Weight: Weight: 92.5 kg. Weight: 203.6 lb. Body mass index: BMI: 33.9 kg/m^2. Body surface area: BSA: 2.1 m^2. Blood pressure: 146/60 Patient status: Inpatient. Study date: Study date: 03/14/2014. Study time: 02:26 PM.  Location: Bedside.  -------------------------------------------------------------------  ------------------------------------------------------------------- Left ventricle: The cavity size was normal. Wall  thickness was increased increased in a pattern of mild to moderate LVH. Systolic function was normal. The estimated ejection fraction was in the range of 50% to 55%. Images were inadequate for LV wall motion assessment. Doppler parameters are consistent with an irreversible restrictive pattern, indicative of decreased left ventricular diastolic compliance and/or increased left atrial pressure (grade 4 diastolic dysfunction).  ------------------------------------------------------------------- Aortic valve: Mildly calcified annulus. Trileaflet; mildly thickened leaflets. Doppler: There was no stenosis. There was moderate regurgitation. VTI ratio of LVOT to aortic valve: 0.64. Valve area (VTI): 2.01 cm^2. Indexed valve area (VTI): 0.96 cm^2/m^2. Peak velocity ratio of LVOT to aortic valve: 0.6. Valve area (Vmax): 1.88 cm^2. Indexed valve area (Vmax): 0.9 cm^2/m^2. Mean gradient (S): 6 mm Hg. Peak gradient (S): 11 mm Hg.  ------------------------------------------------------------------- Aorta: Aortic root: The aortic root was normal in size.  ------------------------------------------------------------------- Mitral valve: Mildly calcified annulus. Moderately thickened leaflets . Doppler: There was no evidence for stenosis. There was mild regurgitation. Mean gradient (D): 2 mm Hg. Peak gradient (D): 8 mm Hg.  ------------------------------------------------------------------- Left atrium: The atrium was moderately dilated.  ------------------------------------------------------------------- Atrial septum: Poorly visualized.  ------------------------------------------------------------------- Pulmonary veins: There is systolic blunting of pulmonary vein flow suggestive of  elevated LA pressure.  ------------------------------------------------------------------- Right ventricle: The cavity size was mildly to moderately dilated. Systolic function was low normal. RV TAPSE is 1.6 cm.  ------------------------------------------------------------------- Pulmonic valve: Not well visualized. Doppler: There was no evidence for stenosis. There was no significant regurgitation.  ------------------------------------------------------------------- Tricuspid valve: Not well visualized. Doppler: There was no evidence for stenosis. There was moderate-severe regurgitation. The jet is eccentric and posterior, this may lead to underestimation of severity. The TR vena contracta measures 0.4 cm. There is systolic reversal of hepatic vein flow suggestive of severe TR. The morphological etiology of the TR is unclear from available images.  ------------------------------------------------------------------- Pulmonary artery: Systolic pressure was moderately increased. Pulmonary pressure may be underestimated in the setting of significant TR.  ------------------------------------------------------------------- Right atrium: The atrium was moderately dilated.  ------------------------------------------------------------------- Pericardium: There was no pericardial effusion.  ------------------------------------------------------------------- Systemic veins: Inferior vena cava: The vessel was dilated. The respirophasic diameter changes were blunted (< 50%), consistent with elevated central venous pressure.  ------------------------------------------------------------------- Prepared and Electronically Authenticated by  Kerry Hough, M.D. 2015-06-16T16:06:42  ------------------------------------------------------------------- Measurements  Left ventricle Value 09/07/2013 Reference LV ID, ED, PLAX (H) 52.3 mm 41.1 43 - 52 chordal LV ID, ES, PLAX (H) 38.5 mm 33.3  23 - 38 chordal LV fx shortening, (L) 26 % 19 >=29 PLAX chordal LV PW thickness, ED 13.5 mm 17.7 --------- IVS/LV PW ratio, ED (N) 0.99 0.92 <=1.3  Ventricular septum Value 09/07/2013 Reference IVS thickness, ED 13.4 mm 16.3 ---------  LVOT Value 09/07/2013 Reference LVOT ID, S 20 mm 20 --------- LVOT area 3.14 cm^2 3.14 --------- LVOT peak velocity, S 99.45 cm/s 94.3 --------- LVOT VTI, S 19.85 cm 23 ---------  Aortic valve Value 09/07/2013 Reference Aortic valve peak 165.99 cm/s 192 --------- velocity, S Aortic valve mean 111.03 cm/s 131 --------- velocity, S Aortic valve VTI, S 30.88 cm 40.3 --------- Aortic mean gradient, 6 mm Hg 7 --------- S Aortic peak gradient, 11 mm Hg 15 --------- S VTI ratio, LVOT/AV 0.64 0.57 --------- Aortic valve area, 2.01 cm^2 1.79 --------- VTI Aortic valve 0.96 cm^2/m^2 0.85 --------- area/bsa, VTI Velocity ratio, peak, 0.6 0.49 --------- LVOT/AV Aortic valve area, 1.88 cm^2 1.54 --------- peak velocity Aortic valve 0.9 cm^2/m^2 0.73 --------- area/bsa, peak velocity Aortic regurg 324 ms 430 --------- pressure half-time  Aorta  Value 09/07/2013 Reference Aortic root ID, ED 31 mm 31 ---------  Left atrium Value 09/07/2013 Reference LA ID, A-P, ES 44 mm 42 --------- LA volume/bsa, S 33 ml/m^2 ---------- ---------  Mitral valve Value 09/07/2013 Reference Mitral E-wave peak 143 cm/s 126 --------- velocity Mitral A-wave peak 53.3 cm/s 75.8 --------- velocity Mitral mean velocity, 67 cm/s 40.8 --------- D Mitral mean gradient, 2 mm Hg 1 --------- D Mitral peak gradient, 8 mm Hg 1 --------- D Mitral E/A ratio, 2.7 1.7 --------- peak Mitral annulus VTI, D 31.8 cm 17.8 ---------  Pulmonary arteries Value 09/07/2013 Reference PA pressure, S, DP (H) 61 mm Hg ---------- <=30  Right ventricle Value 09/07/2013 Reference RV s&', lateral, S 10.4 cm/s 9.1 ---------  Legend: (L) and (H) mark values outside specified reference  range.  (N) marks values inside specified reference       Dg Chest 2 View  03/16/2014   CLINICAL DATA:  Fever.  EXAM: CHEST  2 VIEW  COMPARISON:  March 15, 2014.  FINDINGS: Stable cardiomegaly. No pneumothorax or pleural effusion is noted. No acute pulmonary disease is noted. Bony thorax appears intact  IMPRESSION: No acute cardiopulmonary abnormality seen.   Electronically Signed   By: Sabino Dick M.D.   On: 03/16/2014 12:16   Dg Chest 2 View  03/13/2014   CLINICAL DATA:  Weakness.  End-stage renal disease.  EXAM: CHEST  2 VIEW  COMPARISON:  09/04/2012  FINDINGS: The heart is enlarged but stable. Stable tortuosity and calcification of the thoracic aorta. Mild vascular congestion but no overt pulmonary edema. No pleural effusions or focal infiltrates. The bony thorax is intact.  IMPRESSION: Stable cardiac enlargement.  Mild vascular congestion but no infiltrates or effusions.   Electronically Signed   By: Kalman Jewels M.D.   On: 03/13/2014 09:55   US Renal  03/15/2014   CLINICAL DATA:  Assess for renal size, hypertension.  EXAM: RENAL/URINARY TRACT ULTRASOUND COMPLETE  COMPARISON:  CT abdomen and pelvis January 11, 2004  FINDINGS: Right Kidney:  Length: 11.9 cm. Echogenicity within normal limits. No hydronephrosis visualized. There is a 3.2 x 2.9 x 3 cm anechoic cyst in the upper pole right kidney.  Left Kidney:  Length: 13.2 cm. Echogenicity within normal limits. No mass visualized. There is dilated calyx in the midpole left kidney. There is no hydronephrosis.  Bladder:  Not visualized due to overlying bowel gas and patient's body habitus per ultrasound technologist.  IMPRESSION: Bilateral normal renal size. Focal dilated calyx in the midpole left kidney without hydronephrosis.   Electronically Signed   By: Abelardo Diesel M.D.   On: 03/15/2014 17:34   US Biopsy  03/20/2014   CLINICAL DATA:  Acute renal failure, proteinuria, history of lupus.  COMPARISON:  03/15/2014  TECHNIQUE: ULTRASOUND GUIDED  RENAL CORE BIOPSY  The procedure, risks (including but not limited to bleeding, infection, organ damage ), benefits, and alternatives were explained to the patient. Questions regarding the procedure were encouraged and answered. The patient understands and consents to the procedure. Survey ultrasound was performed and an appropriate skin entry site was localized. Site was marked, prepped with Betadine, draped in usual sterile fashion, infiltrated locally with 1% lidocaine. Intravenous Fentanyl and Versed were administered as conscious sedation during continuous cardiorespiratory monitoring by the radiology RN with a total moderate sedation time of less than 30 minutes.  Under real time ultrasound guidance, a 15 gauge trocar needle was advanced to the margin of the lower pole of the right kidney. A  coaxial Bard core needle was used to obtain 3 16 gauge samples. The guide needle was removed. The core samples were submitted to pathology. The patient tolerated procedure well, with no immediate complication.  IMPRESSION: 1. Technically successful ultrasound-guided core renal biopsy , lower pole right kidney.   Electronically Signed   By: Arne Cleveland M.D.   On: 03/20/2014 16:18   Ir Fluoro Guide Cv Line Right  03/25/2014   CLINICAL DATA:  Renal failure  EXAM: RIGHT INTERNAL JUGULAR PICC LINE PLACEMENT WITH ULTRASOUND AND FLUOROSCOPIC GUIDANCE  FLUOROSCOPY TIME:  6 seconds.  PROCEDURE: The patient was advised of the possible risks andcomplications and agreed to undergo the procedure. The patient was then brought to the angiographic suite for the procedure.  The right neck was prepped with chlorhexidine, drapedin the usual sterile fashion using maximum barrier technique (cap and mask, sterile gown, sterile gloves, large sterile sheet, hand hygiene and cutaneous antisepsis) and infiltrated locally with 1% Lidocaine.  Ultrasound demonstrated patency of the right internal jugular vein, and this was documented with an  image. Under real-time ultrasound guidance, this vein was accessed with a 21 gauge micropuncture needle and image documentation was performed. A 0.018 wire was introduced in to the vein. Over this, a 5 [place] double lumen Power PICC was advanced to the lower SVC/right atrial junction. Fluoroscopy during the procedure and fluoro spot radiograph confirms appropriate catheter position. The catheter was flushed and covered with asterile dressing. Catheter length is 21 cm.  Complications: None  IMPRESSION: Successful right internal jugular Power PICC line placement with ultrasound and fluoroscopic guidance. The catheter is ready for use.   Electronically Signed   By: Maryclare Bean M.D.   On: 03/25/2014 12:39   Ir US Guide Vasc Access Right  03/25/2014   CLINICAL DATA:  Renal failure  EXAM: RIGHT INTERNAL JUGULAR PICC LINE PLACEMENT WITH ULTRASOUND AND FLUOROSCOPIC GUIDANCE  FLUOROSCOPY TIME:  6 seconds.  PROCEDURE: The patient was advised of the possible risks andcomplications and agreed to undergo the procedure. The patient was then brought to the angiographic suite for the procedure.  The right neck was prepped with chlorhexidine, drapedin the usual sterile fashion using maximum barrier technique (cap and mask, sterile gown, sterile gloves, large sterile sheet, hand hygiene and cutaneous antisepsis) and infiltrated locally with 1% Lidocaine.  Ultrasound demonstrated patency of the right internal jugular vein, and this was documented with an image. Under real-time ultrasound guidance, this vein was accessed with a 21 gauge micropuncture needle and image documentation was performed. A 0.018 wire was introduced in to the vein. Over this, a 5 [place] double lumen Power PICC was advanced to the lower SVC/right atrial junction. Fluoroscopy during the procedure and fluoro spot radiograph confirms appropriate catheter position. The catheter was flushed and covered with asterile dressing. Catheter length is 21 cm.  Complications:  None  IMPRESSION: Successful right internal jugular Power PICC line placement with ultrasound and fluoroscopic guidance. The catheter is ready for use.   Electronically Signed   By: Maryclare Bean M.D.   On: 03/25/2014 12:39   Dg Chest Port 1 View  03/15/2014   CLINICAL DATA:  Short of breath.  EXAM: PORTABLE CHEST - 1 VIEW  COMPARISON:  03/14/2014  FINDINGS: Mild enlargement of the cardiopericardial silhouette, stable. Central vascular congestion and hazy perihilar airspace opacity as well as central interstitial thickening is stable consistent with congestive heart failure and pulmonary edema.  No pneumothorax.  IMPRESSION: No change from the prior study. Findings  remain consistent with mild congestive heart failure.   Electronically Signed   By: Lajean Manes M.D.   On: 03/15/2014 10:16   Dg Chest Port 1 View  03/14/2014   CLINICAL DATA:  Shortness of breath.  EXAM: PORTABLE CHEST - 1 VIEW  COMPARISON:  03/13/2014  FINDINGS: Chronic cardiopericardial enlargement. There is vascular pedicle widening. There is newly cephalized blood flow and interstitial indistinctness. No asymmetric opacity, definitive effusion, or pneumothorax.  IMPRESSION: CHF.   Electronically Signed   By: Jorje Guild M.D.   On: 03/14/2014 17:55    ASSESSMENT:  #1. 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  #2. Systemic lupus erythematosus, on treatment with hydroxychloroquine.  #3. Diastolic heart failure with mild aortic stenosis.  #4. Hypertension, controlled.  #5. Diabetes mellitus, type II, non-insulin regarding, controlled.  #6. Chronic obstructive pulmonary disease #7. Left upper extremity fistula not appreciated with no palpable bruit.    PLAN:  #1. Reevaluate left upper extremity fistula by vascular surgery. #2. Aranesp 500 mcg subcutaneously to be repeated monthly hemoglobin is  less than 11. #3. Warfarin dosage per Dr. Harl Bowie. #4. Followup in 4 weeks with CBC.   All questions were answered. The patient knows to call the clinic with any problems, questions or concerns. We can certainly see the patient much sooner if necessary.   I spent 25 minutes counseling the patient face to face. The total time spent in the appointment was 30 minutes.    Doroteo Bradford, MD 04/06/2014 2:23 PM  DISCLAIMER:  This note was dictated with voice recognition software.  Similar sounding words can inadvertently be transcribed inaccurately and may not be corrected upon review.

## 2014-04-07 ENCOUNTER — Ambulatory Visit (INDEPENDENT_AMBULATORY_CARE_PROVIDER_SITE_OTHER): Payer: Medicare Other | Admitting: *Deleted

## 2014-04-07 DIAGNOSIS — I4892 Unspecified atrial flutter: Secondary | ICD-10-CM

## 2014-04-07 DIAGNOSIS — Z5181 Encounter for therapeutic drug level monitoring: Secondary | ICD-10-CM

## 2014-04-07 DIAGNOSIS — I484 Atypical atrial flutter: Secondary | ICD-10-CM

## 2014-04-07 NOTE — Telephone Encounter (Signed)
See coumadin note. 

## 2014-04-10 LAB — POCT INR: INR: 3.2

## 2014-04-11 ENCOUNTER — Encounter: Payer: Self-pay | Admitting: Vascular Surgery

## 2014-04-11 ENCOUNTER — Ambulatory Visit (INDEPENDENT_AMBULATORY_CARE_PROVIDER_SITE_OTHER): Payer: Medicare Other | Admitting: *Deleted

## 2014-04-11 DIAGNOSIS — I4892 Unspecified atrial flutter: Secondary | ICD-10-CM

## 2014-04-11 DIAGNOSIS — Z5181 Encounter for therapeutic drug level monitoring: Secondary | ICD-10-CM

## 2014-04-11 DIAGNOSIS — I484 Atypical atrial flutter: Secondary | ICD-10-CM

## 2014-04-12 ENCOUNTER — Encounter: Payer: Self-pay | Admitting: Vascular Surgery

## 2014-04-12 ENCOUNTER — Ambulatory Visit (INDEPENDENT_AMBULATORY_CARE_PROVIDER_SITE_OTHER): Payer: Self-pay | Admitting: Vascular Surgery

## 2014-04-12 VITALS — BP 150/48 | HR 90 | Temp 97.7°F | Resp 18 | Ht 65.0 in | Wt 202.0 lb

## 2014-04-12 DIAGNOSIS — Z4931 Encounter for adequacy testing for hemodialysis: Secondary | ICD-10-CM

## 2014-04-12 DIAGNOSIS — N185 Chronic kidney disease, stage 5: Secondary | ICD-10-CM | POA: Insufficient documentation

## 2014-04-12 NOTE — Progress Notes (Signed)
     HPI: 68 y/o female who under went First stage left basilic vein transposition by Dr. Donnetta Hutching on 03/27/2014.  She comes in today with a chief complaint that the AV fistula was not working.      Objective 150/48 90 97.7 F (36.5 C) (Oral) 18 97%  PE: Palpable thrill in the AV fistula. The incision is healing well Distally her hand is N/V/M intact no sign of steal  Assessment/Planning: 2 weeks S/P av fistula first stage left basilic vein transposition.  There is a palpable thrill and no sign of steal.  She will keep her scheduled follow up appt with Dr. Donnetta Hutching 05/02/2014    Tashema Tiller [facility] 04/12/2014 3:20 PM   Agree with above. First stage BVT is patent.  [name], MD, [place] [phone] 04/12/2014

## 2014-04-17 ENCOUNTER — Ambulatory Visit (INDEPENDENT_AMBULATORY_CARE_PROVIDER_SITE_OTHER): Payer: Medicare Other | Admitting: *Deleted

## 2014-04-17 DIAGNOSIS — Z5181 Encounter for therapeutic drug level monitoring: Secondary | ICD-10-CM

## 2014-04-17 DIAGNOSIS — I4892 Unspecified atrial flutter: Secondary | ICD-10-CM

## 2014-04-17 LAB — POCT INR: INR: 4.2

## 2014-04-17 MED ORDER — WARFARIN SODIUM 5 MG PO TABS: ORAL_TABLET | ORAL | Status: DC

## 2014-04-24 ENCOUNTER — Other Ambulatory Visit: Payer: Self-pay | Admitting: Vascular Surgery

## 2014-04-24 ENCOUNTER — Ambulatory Visit (HOSPITAL_COMMUNITY)
Admit: 2014-04-24 | Discharge: 2014-04-24 | Disposition: A | Discharge status: Home / Self Care | Payer: Medicare Other | Source: Ambulatory Visit | Attending: Vascular Surgery | Admitting: Vascular Surgery

## 2014-04-24 DIAGNOSIS — N189 Chronic kidney disease, unspecified: Secondary | ICD-10-CM | POA: Insufficient documentation

## 2014-04-24 DIAGNOSIS — N186 End stage renal disease: Secondary | ICD-10-CM

## 2014-04-24 DIAGNOSIS — N185 Chronic kidney disease, stage 5: Secondary | ICD-10-CM

## 2014-04-24 DIAGNOSIS — Z4931 Encounter for adequacy testing for hemodialysis: Secondary | ICD-10-CM | POA: Diagnosis not present

## 2014-05-01 ENCOUNTER — Encounter: Payer: Self-pay | Admitting: Vascular Surgery

## 2014-05-01 ENCOUNTER — Ambulatory Visit (INDEPENDENT_AMBULATORY_CARE_PROVIDER_SITE_OTHER): Payer: Medicare Other | Admitting: *Deleted

## 2014-05-01 DIAGNOSIS — Z5181 Encounter for therapeutic drug level monitoring: Secondary | ICD-10-CM

## 2014-05-01 DIAGNOSIS — I4892 Unspecified atrial flutter: Secondary | ICD-10-CM

## 2014-05-01 LAB — POCT INR: INR: 2.7

## 2014-05-02 ENCOUNTER — Ambulatory Visit (INDEPENDENT_AMBULATORY_CARE_PROVIDER_SITE_OTHER): Payer: Self-pay | Admitting: Vascular Surgery

## 2014-05-02 ENCOUNTER — Encounter: Payer: Self-pay | Admitting: Vascular Surgery

## 2014-05-02 VITALS — BP 157/72 | HR 76 | Resp 14 | Ht 65.0 in | Wt 207.0 lb

## 2014-05-02 DIAGNOSIS — N185 Chronic kidney disease, stage 5: Secondary | ICD-10-CM

## 2014-05-02 NOTE — Progress Notes (Signed)
Here today for followup of first stage of basilic vein transposition by myself on 03/27/2014. She has done well with this with no difficulty with her antecubital incision. She does have an excellent thrill at the antecubital space and upper arm. There is no healing issue.  She did undergo a SonoSite ultrasound by myself and this does show patency of the basilic vein with good dilatation.  I discussed this at length with the patient and her husband present. I have recommended proceeding with second stage of basilic vein transposition. I explained the procedure as an outpatient 1 with mobilization of basilic vein and tunneling it subcutaneously and reattaching to the brachial artery. The patient has a upcoming appointment with Dr. Moshe Cipro and wishes to defer scheduling of this until she discusses this with her further. I explained that are hoped will be to have adequate access prior to needing hemodialysis. The patient will contact us when she wishes to proceed with second stage of basilic vein is, left arm

## 2014-05-08 ENCOUNTER — Encounter (HOSPITAL_COMMUNITY): Payer: Medicare Other | Attending: Hematology and Oncology

## 2014-05-08 ENCOUNTER — Encounter (HOSPITAL_BASED_OUTPATIENT_CLINIC_OR_DEPARTMENT_OTHER): Payer: Medicare Other

## 2014-05-08 ENCOUNTER — Encounter (HOSPITAL_COMMUNITY): Payer: Medicare Other

## 2014-05-08 VITALS — BP 169/47 | HR 66 | Temp 97.9°F | Resp 16 | Wt 211.0 lb

## 2014-05-08 DIAGNOSIS — N039 Chronic nephritic syndrome with unspecified morphologic changes: Secondary | ICD-10-CM

## 2014-05-08 DIAGNOSIS — N189 Chronic kidney disease, unspecified: Secondary | ICD-10-CM

## 2014-05-08 DIAGNOSIS — J438 Other emphysema: Secondary | ICD-10-CM

## 2014-05-08 DIAGNOSIS — D631 Anemia in chronic kidney disease: Secondary | ICD-10-CM | POA: Insufficient documentation

## 2014-05-08 DIAGNOSIS — M329 Systemic lupus erythematosus, unspecified: Secondary | ICD-10-CM

## 2014-05-08 LAB — CBC WITH DIFFERENTIAL/PLATELET
BASOS PCT: 0 % (ref 0–1)
Basophils Absolute: 0 10*3/uL (ref 0.0–0.1)
EOS PCT: 1 % (ref 0–5)
Eosinophils Absolute: 0.1 10*3/uL (ref 0.0–0.7)
HEMATOCRIT: 32.5 % — AB (ref 36.0–46.0)
HEMOGLOBIN: 10.6 g/dL — AB (ref 12.0–15.0)
Lymphocytes Relative: 23 % (ref 12–46)
Lymphs Abs: 1.7 10*3/uL (ref 0.7–4.0)
MCH: 30.3 pg (ref 26.0–34.0)
MCHC: 32.6 g/dL (ref 30.0–36.0)
MCV: 92.9 fL (ref 78.0–100.0)
MONO ABS: 0.5 10*3/uL (ref 0.1–1.0)
Monocytes Relative: 6 % (ref 3–12)
NEUTROS ABS: 5.3 10*3/uL (ref 1.7–7.7)
Neutrophils Relative %: 70 % (ref 43–77)
Platelets: 197 10*3/uL (ref 150–400)
RBC: 3.5 MIL/uL — ABNORMAL LOW (ref 3.87–5.11)
RDW: 15.2 % (ref 11.5–15.5)
WBC: 7.5 10*3/uL (ref 4.0–10.5)

## 2014-05-08 LAB — FERRITIN: Ferritin: 682 ng/mL — ABNORMAL HIGH (ref 10–291)

## 2014-05-08 NOTE — Patient Instructions (Addendum)
..  [facility] Discharge Instructions  RECOMMENDATIONS MADE BY THE CONSULTANT AND ANY TEST RESULTS WILL BE SENT TO YOUR REFERRING PHYSICIAN.  EXAM FINDINGS BY THE PHYSICIAN TODAY AND SIGNS OR SYMPTOMS TO REPORT TO CLINIC OR PRIMARY PHYSICIAN: Exam and findings as discussed by Dr. Barnet Glasgow.  MEDICATIONS PRESCRIBED:  Aranesp as needed for anemia Hemoglobin 10.6 today so shot is not needed  INSTRUCTIONS/FOLLOW-UP:2 months with cbc and ferritin   Thank you for choosing [facility] to provide your oncology and hematology care.  To afford each patient quality time with our providers, please arrive at least 15 minutes before your scheduled appointment time.  With your help, our goal is to use those 15 minutes to complete the necessary work-up to ensure our physicians have the information they need to help with your evaluation and healthcare recommendations.    Effective January 1st, 2014, we ask that you re-schedule your appointment with our physicians should you arrive 10 or more minutes late for your appointment.  We strive to give you quality time with our providers, and arriving late affects you and other patients whose appointments are after yours.    Again, thank you for choosing [facility].  Our hope is that these requests will decrease the amount of time that you wait before being seen by our physicians.       _____________________________________________________________  Should you have questions after your visit to [facility], please contact our office at [phone] between the hours of 8:30 a.m. and 4:30 p.m.  Voicemails left after 4:30 p.m. will not be returned until the following business day.  For prescription refill requests, have your pharmacy contact our office with your prescription refill request.    _______________________________________________________________  We hope that we have given you very good care.  You  may receive a patient satisfaction survey in the mail, please complete it and return it as soon as possible.  We value your feedback!  _______________________________________________________________  Have you asked about our STAR program?  STAR stands for Survivorship Training and Rehabilitation, and this is a nationally recognized cancer care program that focuses on survivorship and rehabilitation.  Cancer and cancer treatments may cause problems, such as, pain, making you feel tired and keeping you from doing the things that you need or want to do. Cancer rehabilitation can help. Our goal is to reduce these troubling effects and help you have the best quality of life possible.  You may receive a survey from a nurse that asks questions about your current state of health.  Based on the survey results, all eligible patients will be referred to the [facility] program for an evaluation so we can better serve you!  A frequently asked questions sheet is available upon request.

## 2014-05-08 NOTE — Progress Notes (Signed)
[facility]  OFFICE PROGRESS NOTE  [name], MD [address] [place] [id number]  DIAGNOSIS: Anemia in chronic renal disease - Plan: Ferritin  Systemic lupus erythematosus  Other emphysema  Chief Complaint  Patient presents with  . Follow-up  . Anemia in chronic kidney disease    CURRENT THERAPY: Aranesp 500 mcg last given on 04/06/2014, Feraheme on 03/20/2014, anemia disease in the setting of systemic lupus erythematosus and chronic renal failure currently on warfarin as well.  INTERVAL HISTORY: [name] 68 y.o. female returns for followup after initiation of Aranesp therapy for anemia of chronic disease in association with lupus erythematosus and treatment with placquenil and meloxicam in addition to iron deficiency. She had received intravenous Feraheme and has had 2 doses of Aranesp since her last visit. On 03/20/2014 the patient underwent renal biopsy showing focal and segmental glomerulosclerosis after which methotrexate was discontinued.  Left upper extremity AV fistula will be revised. She has not yet undergone hemodialysis. Appetite is good with no nausea, vomiting, diarrhea, constipation, incontinence, worsening lower extremity swelling, skin rash, headache, or seizures.    MEDICAL HISTORY: Past Medical History  Diagnosis Date  . Asthma   . Essential hypertension, benign   . Type 2 diabetes mellitus   . SLE (systemic lupus erythematosus)   . Anemia   . Atrial flutter     TEE/DCCV December 2013 - on Xarelto [facility])  . CKD (chronic kidney disease) stage 3, GFR 30-59 ml/min     INTERIM HISTORY: has Atrial flutter; Morbid obesity; Type 2 diabetes mellitus with diabetic neuropathy; SLE (systemic lupus erythematosus); Essential hypertension, benign; COPD (chronic obstructive pulmonary disease); CKD (chronic kidney disease) stage 3, GFR 30-59 ml/min; Aortic regurgitation; Shortness of breath; Carotid  bruit; Folate deficiency; Anemia in chronic renal disease; Renal failure (ARF), acute on chronic; Dehydration; Hyponatremia; Acute on chronic renal failure; Dyspnea; Diastolic CHF, acute on chronic; Acute respiratory failure with hypoxia; Fever, unspecified; Acute on chronic diastolic congestive heart failure; Encounter for therapeutic drug monitoring; 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   Assessment/Plan: (Dr. Donato Heinz) 03/28/2014  1. AKI/CKD- renal biopsy revealed collapsing FSG with severe arteriosclerosis and moterate to severe tubulointerstitial scarring. Poor prognosis and agree with stopping immunosuppressive medications as risks outweigh the benefits. Agree with proceeding with AVF/AVG placement as there is not indication to initiate dialysis at this time, however given the biopsy results, RRT is likely to be required in the near future. Cont with education and preparation.  1. Stable for discharge and will be seen at our office next week in follow up. 2. Discussed the ways to delay the progression of CKD with BP control (goal <130/80), glucose control (goal HgbA1c <7%), and use of ACE/ARB (on hold due to AKI), and avoidance of NSAIDs/Cox-II I's. 2. Vascular access- s/p LUE AVF placed 03/27/14 by Dr. Donnetta Hutching, +T/B 3. SLE- on pred/plaquenil and hold MTX  4. H/o A flutter- stable 5. DM- per primary 6. HTN- hold off on ACE/ARB at this time given AKI/CKD. Cont with amlodipine and metoprolol for now. 7. SHPTH- on calcitriol 0.25mg  daily 8. Metabolic acidosis- resolved. Off of bicarb. 9. DVT- on lovenox and coumadin to start after surgery  ALLERGIES:  has No Known Allergies.  MEDICATIONS: has a current medication list which includes the following prescription(s): acetaminophen, albuterol, amlodipine, aspirin ec, atorvastatin,  calcitriol,  folic acid, furosemide, hydrocodone-acetaminophen, hydroxychloroquine, insulin aspart, insulin detemir, metoprolol, multiple vitamins-minerals, prednisone, vitamin d (ergocalciferol), warfarin, and zolpidem.  SURGICAL HISTORY:  Past Surgical History  Procedure Laterality Date  . Ankle surgery  1993  . Back surgery  1980  . Abdominal hysterectomy  1983  . Tee without cardioversion  09/16/2012    Procedure: TRANSESOPHAGEAL ECHOCARDIOGRAM (TEE);  Surgeon: Sanda Klein, MD;  Location: [facility];  Service: Cardiovascular;  Laterality: N/A;  . Cardioversion  09/16/2012    Procedure: CARDIOVERSION;  Surgeon: Sanda Klein, MD;  Location: [facility] ENDOSCOPY;  Service: Cardiovascular;  Laterality: N/A;  . Esophagogastroduodenoscopy  09/20/2012    Procedure: ESOPHAGOGASTRODUODENOSCOPY (EGD);  Surgeon: Cleotis Nipper, MD;  Location: [facility] ENDOSCOPY;  Service: Endoscopy;  Laterality: Left;  . Colonoscopy  09/20/2012    Procedure: COLONOSCOPY;  Surgeon: Cleotis Nipper, MD;  Location: [facility] ENDOSCOPY;  Service: Endoscopy;  Laterality: Left;  . Av fistula placement Left 03/27/2014    Procedure: ARTERIOVENOUS (AV) FISTULA CREATION;  Surgeon: Rosetta Posner, MD;  Location: [facility] OR;  Service: Vascular;  Laterality: Left;    FAMILY HISTORY: family history includes Diabetes in her brother, father, and sister; Hypertension in her brother, father, and sister; Stroke in her mother.  SOCIAL HISTORY:  reports that she has quit smoking. Her smoking use included Cigarettes. She has a 30 pack-year smoking history. She has never used smokeless tobacco. She reports that she does not drink alcohol or use illicit drugs.  REVIEW OF SYSTEMS:  Other than that discussed above is noncontributory.  PHYSICAL EXAMINATION: ECOG PERFORMANCE STATUS: 1 - Symptomatic but completely ambulatory  Blood pressure 169/47, pulse 66, temperature 97.9 F (36.6 C), temperature source Oral, resp. rate 16, weight 211 lb (95.709  kg).  GENERAL:alert, no distress and comfortable. Morbidly obese. SKIN: skin color, texture, turgor are normal, no rashes or significant lesions EYES: PERLA; Conjunctiva are pink and non-injected, sclera clear SINUSES: No redness or tenderness over maxillary or ethmoid sinuses OROPHARYNX:no exudate, no erythema on lips, buccal mucosa, or tongue. NECK: supple, thyroid normal size, non-tender, without nodularity. No masses CHEST: Normal AP diameter with no breast masses. LYMPH:  no palpable lymphadenopathy in the cervical, axillary or inguinal LUNGS: clear to auscultation and percussion with normal breathing effort HEART: regular rate & rhythm and no murmurs. ABDOMEN:abdomen soft, non-tender and normal bowel sounds MUSCULOSKELETAL:no cyanosis of digits and no clubbing. Range of motion normal. Left upper extremity AV fistula deep. NEURO: alert & oriented x 3 with fluent speech, no focal motor/sensory deficits   LABORATORY DATA: Lab on 05/08/2014  Component Date Value Ref Range Status  . WBC 05/08/2014 7.5  4.0 - 10.5 K/uL Final  . RBC 05/08/2014 3.50* 3.87 - 5.11 MIL/uL Final  . Hemoglobin 05/08/2014 10.6* 12.0 - 15.0 g/dL Final  . HCT 05/08/2014 32.5* 36.0 - 46.0 % Final  . MCV 05/08/2014 92.9  78.0 - 100.0 fL Final  . MCH 05/08/2014 30.3  26.0 - 34.0 pg Final  . MCHC 05/08/2014 32.6  30.0 - 36.0 g/dL Final  . RDW 05/08/2014 15.2  11.5 - 15.5 % Final  . Platelets 05/08/2014 197  150 - 400 K/uL Final  . Neutrophils Relative % 05/08/2014 70  43 - 77 % Final  . Neutro Abs 05/08/2014 5.3  1.7 - 7.7 K/uL Final  . Lymphocytes Relative 05/08/2014 23  12 - 46 % Final  . Lymphs Abs 05/08/2014 1.7  0.7 - 4.0 K/uL Final  . Monocytes Relative 05/08/2014 6  3 - 12 % Final  . Monocytes Absolute 05/08/2014 0.5  0.1 - 1.0 K/uL Final  . Eosinophils Relative 05/08/2014 1  0 - 5 % Final  . Eosinophils Absolute 05/08/2014 0.1  0.0 - 0.7 K/uL Final  . Basophils Relative 05/08/2014 0  0 - 1 % Final  .  Basophils Absolute 05/08/2014 0.0  0.0 - 0.1 K/uL Final  Anti-coag visit on 05/01/2014  Component Date Value Ref Range Status  . INR 05/01/2014 2.7   Final  Anti-coag visit on 04/17/2014  Component Date Value Ref Range Status  . INR 04/17/2014 4.2   Final  Anti-coag visit on 04/11/2014  Component Date Value Ref Range Status  . INR 04/10/2014 3.2   Final    PATHOLOGY: Renal biopsy: 03/20/2014 Collapsed invariant of focal and segmental glomerulosclerosis in Association with diabetic glomerulosclerosis    Urinalysis    Component Value Date/Time   COLORURINE AMBER* 03/17/2014 1830   APPEARANCEUR CLOUDY* 03/17/2014 1830   LABSPEC 1.023 03/17/2014 1830   PHURINE 5.0 03/17/2014 1830   GLUCOSEU NEGATIVE 03/17/2014 1830   HGBUR MODERATE* 03/17/2014 1830   BILIRUBINUR SMALL* 03/17/2014 1830   [place] 03/17/2014 1830   PROTEINUR >300* 03/17/2014 1830   UROBILINOGEN 1.0 03/17/2014 1830   NITRITE NEGATIVE 03/17/2014 1830   LEUKOCYTESUR LARGE* 03/17/2014 1830    RADIOGRAPHIC STUDIES:   ASSESSMENT:  #1. 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  #2. Systemic lupus erythematosus, on treatment with hydroxychloroquine.  #3. Diastolic heart failure with mild aortic stenosis.  #4. Hypertension, controlled.  #5. Diabetes mellitus, type II, non-insulin regarding, controlled.  #6. Chronic obstructive pulmonary disease  #7. Left upper extremity fistula not appreciated with no palpable bruit. For future revision. #8. Right upper extremity DVT, on warfarin, managed by cardiology.      PLAN:  #1. Due to financial constraints, Aranesp will be held today. #2. Followup in 2 months with CBC,ferritin.   All questions were answered. The patient knows to call the clinic with any problems, questions or concerns. We can certainly see the patient much sooner if  necessary.   I spent 25 minutes counseling the patient face to face. The total time spent in the appointment was 30 minutes.    Doroteo Bradford, MD 05/08/2014 10:16 AM  DISCLAIMER:  This note was dictated with voice recognition software.  Similar sounding words can inadvertently be transcribed inaccurately and may not be corrected upon review.   He began cycle is one daily and a retry were treating her prophylactic

## 2014-05-08 NOTE — Progress Notes (Signed)
[name]'s reason for visit today are for labs as scheduled per MD orders.  Venipuncture performed with a 23 gauge butterfly needle to R Antecubital.  [name] tolerated venipuncture well and without incident; questions were answered and patient was discharged.

## 2014-05-15 ENCOUNTER — Ambulatory Visit (INDEPENDENT_AMBULATORY_CARE_PROVIDER_SITE_OTHER): Payer: Medicare Other | Admitting: *Deleted

## 2014-05-15 DIAGNOSIS — Z5181 Encounter for therapeutic drug level monitoring: Secondary | ICD-10-CM

## 2014-05-15 DIAGNOSIS — I4892 Unspecified atrial flutter: Secondary | ICD-10-CM

## 2014-05-15 LAB — POCT INR: INR: 2.3

## 2014-05-16 ENCOUNTER — Other Ambulatory Visit: Payer: Self-pay | Admitting: *Deleted

## 2014-05-16 MED ORDER — METOPROLOL TARTRATE 50 MG PO TABS: 50.0000 mg | ORAL_TABLET | Freq: Two times a day (BID) | ORAL | Status: DC

## 2014-05-17 ENCOUNTER — Other Ambulatory Visit: Payer: Self-pay | Admitting: Cardiology

## 2014-06-06 ENCOUNTER — Ambulatory Visit (HOSPITAL_COMMUNITY): Payer: Medicare Other

## 2014-06-06 ENCOUNTER — Other Ambulatory Visit (HOSPITAL_COMMUNITY): Payer: Medicare Other

## 2014-06-07 ENCOUNTER — Ambulatory Visit (INDEPENDENT_AMBULATORY_CARE_PROVIDER_SITE_OTHER): Payer: Medicare Other | Admitting: *Deleted

## 2014-06-07 DIAGNOSIS — Z5181 Encounter for therapeutic drug level monitoring: Secondary | ICD-10-CM

## 2014-06-07 DIAGNOSIS — I4892 Unspecified atrial flutter: Secondary | ICD-10-CM

## 2014-06-07 LAB — POCT INR: INR: 1.4

## 2014-06-13 ENCOUNTER — Other Ambulatory Visit: Payer: Self-pay | Admitting: Cardiology

## 2014-06-21 ENCOUNTER — Ambulatory Visit (INDEPENDENT_AMBULATORY_CARE_PROVIDER_SITE_OTHER): Payer: Medicare Other | Admitting: *Deleted

## 2014-06-21 DIAGNOSIS — I4892 Unspecified atrial flutter: Secondary | ICD-10-CM

## 2014-06-21 DIAGNOSIS — Z5181 Encounter for therapeutic drug level monitoring: Secondary | ICD-10-CM

## 2014-06-21 LAB — POCT INR: INR: 2

## 2014-06-22 ENCOUNTER — Encounter (HOSPITAL_COMMUNITY): Payer: Self-pay

## 2014-07-03 ENCOUNTER — Encounter (HOSPITAL_COMMUNITY): Payer: Medicare Other

## 2014-07-03 ENCOUNTER — Encounter (HOSPITAL_COMMUNITY): Payer: Self-pay

## 2014-07-03 ENCOUNTER — Encounter (HOSPITAL_BASED_OUTPATIENT_CLINIC_OR_DEPARTMENT_OTHER): Payer: Medicare Other

## 2014-07-03 ENCOUNTER — Encounter (HOSPITAL_COMMUNITY): Payer: Medicare Other | Attending: Hematology and Oncology

## 2014-07-03 VITALS — BP 141/49 | HR 69 | Temp 98.0°F | Resp 18 | Wt 206.0 lb

## 2014-07-03 DIAGNOSIS — N058 Unspecified nephritic syndrome with other morphologic changes: Secondary | ICD-10-CM | POA: Insufficient documentation

## 2014-07-03 DIAGNOSIS — M329 Systemic lupus erythematosus, unspecified: Secondary | ICD-10-CM

## 2014-07-03 DIAGNOSIS — N189 Chronic kidney disease, unspecified: Secondary | ICD-10-CM | POA: Diagnosis not present

## 2014-07-03 DIAGNOSIS — Z23 Encounter for immunization: Secondary | ICD-10-CM

## 2014-07-03 DIAGNOSIS — D631 Anemia in chronic kidney disease: Secondary | ICD-10-CM

## 2014-07-03 DIAGNOSIS — IMO0002 Reserved for concepts with insufficient information to code with codable children: Secondary | ICD-10-CM

## 2014-07-03 DIAGNOSIS — N183 Chronic kidney disease, stage 3 unspecified: Secondary | ICD-10-CM

## 2014-07-03 LAB — CBC
HCT: 32.4 % — ABNORMAL LOW (ref 36.0–46.0)
Hemoglobin: 10.7 g/dL — ABNORMAL LOW (ref 12.0–15.0)
MCH: 29.9 pg (ref 26.0–34.0)
MCHC: 33 g/dL (ref 30.0–36.0)
MCV: 90.5 fL (ref 78.0–100.0)
PLATELETS: 214 10*3/uL (ref 150–400)
RBC: 3.58 MIL/uL — ABNORMAL LOW (ref 3.87–5.11)
RDW: 13.1 % (ref 11.5–15.5)
WBC: 7.7 10*3/uL (ref 4.0–10.5)

## 2014-07-03 MED ORDER — INFLUENZA VAC SPLIT QUAD 0.5 ML IM SUSY
0.5000 mL | PREFILLED_SYRINGE | Freq: Once | INTRAMUSCULAR | Status: AC
  Administered 2014-07-03: 0.5 mL via INTRAMUSCULAR

## 2014-07-03 MED ORDER — DARBEPOETIN ALFA-POLYSORBATE 500 MCG/ML IJ SOLN: 500.0000 ug | Freq: Once | INTRAMUSCULAR | Status: DC

## 2014-07-03 MED ORDER — INFLUENZA VAC SPLIT QUAD 0.5 ML IM SUSY
PREFILLED_SYRINGE | INTRAMUSCULAR | Status: AC
  Filled 2014-07-03: qty 0.5

## 2014-07-03 NOTE — Progress Notes (Signed)
Labs for cbc 

## 2014-07-03 NOTE — Patient Instructions (Signed)
[facility] Discharge Instructions  RECOMMENDATIONS MADE BY THE CONSULTANT AND ANY TEST RESULTS WILL BE SENT TO YOUR REFERRING PHYSICIAN.  Ultrasound of spleen as scheduled. Return for blood work and office visit in 6 weeks.   Thank you for choosing [facility] to provide your oncology and hematology care.  To afford each patient quality time with our providers, please arrive at least 15 minutes before your scheduled appointment time.  With your help, our goal is to use those 15 minutes to complete the necessary work-up to ensure our physicians have the information they need to help with your evaluation and healthcare recommendations.    Effective January 1st, 2014, we ask that you re-schedule your appointment with our physicians should you arrive 10 or more minutes late for your appointment.  We strive to give you quality time with our providers, and arriving late affects you and other patients whose appointments are after yours.    Again, thank you for choosing [facility].  Our hope is that these requests will decrease the amount of time that you wait before being seen by our physicians.       _____________________________________________________________  Should you have questions after your visit to [facility], please contact our office at [phone] between the hours of 8:30 a.m. and 4:30 p.m.  Voicemails left after 4:30 p.m. will not be returned until the following business day.  For prescription refill requests, have your pharmacy contact our office with your prescription refill request.    _______________________________________________________________  We hope that we have given you very good care.  You may receive a patient satisfaction survey in the mail, please complete it and return it as soon as possible.  We value your feedback!  _______________________________________________________________  Have you asked about  our STAR program?  STAR stands for Survivorship Training and Rehabilitation, and this is a nationally recognized cancer care program that focuses on survivorship and rehabilitation.  Cancer and cancer treatments may cause problems, such as, pain, making you feel tired and keeping you from doing the things that you need or want to do. Cancer rehabilitation can help. Our goal is to reduce these troubling effects and help you have the best quality of life possible.  You may receive a survey from a nurse that asks questions about your current state of health.  Based on the survey results, all eligible patients will be referred to the [facility] program for an evaluation so we can better serve you!  A frequently asked questions sheet is available upon request.

## 2014-07-03 NOTE — Progress Notes (Signed)
please see office noet encounter

## 2014-07-03 NOTE — Progress Notes (Signed)
[name] presents today for injection per the provider's orders.  Fluvarix administration without incident; see MAR for injection details.  Patient tolerated procedure well and without incident.  No questions or complaints noted at this time. Aranesp 500mg  not given today per Dr. Earney Mallet order (Hgb 10.7).

## 2014-07-04 NOTE — Progress Notes (Signed)
[place] OFFICE PROGRESS NOTE  PCP [name], MD [place] [id number]  DIAGNOSIS: Anemia in chronic renal disease.  CURRENT THERAPY:  Aranesp Injections 570mcg. monthly to maintain Hgb between 10-11gm.   INTERVAL HEMATOLOGY/ONCOLOGY HX: Patient has a history of acute on chronic kidney disease. An AV fistula was placed in her left arm. Creatinine in August had decreased to 1.8 and dialysis has been deferred. She has a diagnosis of SLE on hydroxychloroquine. Prior dose of Aranesp was scheduled to be given in August but cancelled for financial reasons.    MEDICAL HISTORY:  Past Medical History  Diagnosis Date  . Asthma   . Essential hypertension, benign   . Type 2 diabetes mellitus   . SLE (systemic lupus erythematosus)   . Anemia   . Atrial flutter     TEE/DCCV December 2013 - on Xarelto [facility])  . CKD (chronic kidney disease) stage 3, GFR 30-59 ml/min     has Atrial flutter; Morbid obesity; Type 2 diabetes mellitus with diabetic neuropathy; SLE (systemic lupus erythematosus); Essential hypertension, benign; COPD (chronic obstructive pulmonary disease); CKD (chronic kidney disease) stage 3, GFR 30-59 ml/min; Aortic regurgitation; Shortness of breath; Carotid bruit; Folate deficiency; Anemia in chronic renal disease; Renal failure (ARF), acute on chronic; Dehydration; Hyponatremia; Acute on chronic renal failure; Dyspnea; Diastolic CHF, acute on chronic; Acute respiratory failure with hypoxia; Fever, unspecified; Acute on chronic diastolic congestive heart failure; Encounter for therapeutic drug monitoring; 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    ALLERGIES:  has No Known Allergies.  MEDICATIONS: has a current medication list which includes the following  prescription(s): acetaminophen, albuterol, amlodipine, aspirin ec, atorvastatin, calcitriol, folic acid, furosemide, hydrocodone-acetaminophen, hydroxychloroquine, insulin aspart, insulin detemir, metoprolol, multiple vitamins-minerals, prednisone, vitamin d (ergocalciferol), and warfarin.  FAMILY HISTORY: family history includes Diabetes in her brother, father, and sister; Hypertension in her brother, father, and sister; Stroke in her mother.  REVIEW OF SYSTEMS:    SINCE YOUR LAST VISIT Been diagnosed or treated for a new medical /surgical  problem or condition: No Any Recent Xrays or studies performed: Yes Any new prescription or OTC medications: No ECOG Perf Status: Ambulatory and capable of all selfcare but unable to carry out any work activities.  Up and about more than 50% of waking hours Problems sleeping: No Medications taken to help sleep: No How is your appetie: 100% normal Any Supplements: No Any trouble chewing or swallowing: No Any Nausea or Vomiting: No Any Bowel problems: No # Bowel Movements per week: 5 Any Urinary Issues: No Any Cardiac Problems: No Any Respiratory Issues: No Any Neurological Issues: No Do you live alone: No Feelings hopelessness: No You or your family have any concerns or Health changes: No Pain Assessment Pain Score: 0-No pain  Other than that discussed above is noncontributory.    PHYSICAL EXAMINATION:   weight is 206 lb (93.441 kg). Her temperature is 98 F (36.7 C). Her blood pressure is 141/49 and her pulse is 69. Her respiration is 18 and oxygen saturation is 100%.    GENERAL: alert, no distress and comfortable SKIN: skin texture, turgor are normal, no rashes or significant lesions.  EYES: PERLA; Conjunctiva are pink and non-injected, sclera clear OROPHARYNX: no exudate, no erythema on lips, buccal mucosa, or tongue. NECK: supple, non-tender, without nodularity. No masses LYMPH:  no palpable lymphadenopathy in  the cervical, axillary  or inguinal LUNGS: clear to auscultation and percussion with normal breathing effort,  HEART: regular rate & rhythm and no murmurs.  BREASTS: not examined ABDOMEN: abdomen soft, non-tender and normal bowel sounds. No masses palpated  MUSCULOSKELETAL/EXTREMITIES: Range of motion normal. Ankle edema 1+   NEURO: alert & oriented x 3 with fluent speech.   LABORATORY DATA: Lab on 07/03/2014  Component Date Value Ref Range Status  . WBC 07/03/2014 7.7  4.0 - 10.5 K/uL Final  . RBC 07/03/2014 3.58* 3.87 - 5.11 MIL/uL Final  . Hemoglobin 07/03/2014 10.7* 12.0 - 15.0 g/dL Final  . HCT 07/03/2014 32.4* 36.0 - 46.0 % Final  . MCV 07/03/2014 90.5  78.0 - 100.0 fL Final  . MCH 07/03/2014 29.9  26.0 - 34.0 pg Final  . MCHC 07/03/2014 33.0  30.0 - 36.0 g/dL Final  . RDW 07/03/2014 13.1  11.5 - 15.5 % Final  . Platelets 07/03/2014 214  150 - 400 K/uL Final  Anti-coag visit on 06/21/2014  Component Date Value Ref Range Status  . INR 06/21/2014 2.0   Final  Anti-coag visit on 06/07/2014  Component Date Value Ref Range Status  . INR 06/07/2014 1.4   Final     RADIOGRAPHIC STUDIES: No results found.  ASSESSMENT:   1. Anemia of chronic disease in association with SLE and chronic kidney disease due to arteriosclerosis on renal biopsy without lulpus. Anemia is stable (Hgb. 10.7) on intermittent therapy with growth factor (Aranesp).      RECOMMENDATIONS:         Schedule repeat labs including CBC, ferritin, and erythropoietin level on return visit in 2 months.          All questions were answered. The patient knows to call the clinic with any problems, questions or concerns. We can certainly see the patient much sooner if necessary.    Darrall Dears, MD 07/04/2014 10:44 PM

## 2014-07-05 ENCOUNTER — Ambulatory Visit (INDEPENDENT_AMBULATORY_CARE_PROVIDER_SITE_OTHER): Payer: Medicare Other | Admitting: *Deleted

## 2014-07-05 ENCOUNTER — Ambulatory Visit (INDEPENDENT_AMBULATORY_CARE_PROVIDER_SITE_OTHER): Payer: Medicare Other | Admitting: Cardiology

## 2014-07-05 ENCOUNTER — Encounter: Payer: Self-pay | Admitting: Cardiology

## 2014-07-05 VITALS — BP 118/58 | HR 60 | Ht 65.0 in | Wt 202.0 lb

## 2014-07-05 DIAGNOSIS — I4892 Unspecified atrial flutter: Secondary | ICD-10-CM

## 2014-07-05 DIAGNOSIS — I351 Nonrheumatic aortic (valve) insufficiency: Secondary | ICD-10-CM

## 2014-07-05 DIAGNOSIS — Z5181 Encounter for therapeutic drug level monitoring: Secondary | ICD-10-CM

## 2014-07-05 DIAGNOSIS — I5032 Chronic diastolic (congestive) heart failure: Secondary | ICD-10-CM

## 2014-07-05 DIAGNOSIS — I483 Typical atrial flutter: Secondary | ICD-10-CM

## 2014-07-05 LAB — POCT INR: INR: 2.3

## 2014-07-05 NOTE — Assessment & Plan Note (Signed)
Maintaining sinus rhythm. She is on Coumadin for diagnosis of upper extremity DVT made back in the summer, anticoagulation will be continued for stroke prophylaxis. CHADSVASC score is 4. Otherwise continue beta blocker.

## 2014-07-05 NOTE — Assessment & Plan Note (Signed)
Restrictive physiology, complicated by acute on chronic renal failure. Patient states that she is making good urine output on Lasix, weight is down 8 pounds from August. She has followup with nephrology soon, it may be that her diuretic regimen can be reduced if renal function continues to improve.

## 2014-07-05 NOTE — Progress Notes (Signed)
Clinical Summary [name] is a medically complex 68 y.o.female last seen by Ms. Bonnell Public PA-C in May of this year. Record review finds hospitalization in June with acute on chronic renal failure, respiratory failure with hypoxia managed with BiPAP and diuresis with component of diastolic heart failure, upper extremity DVT treated with Coumadin. She was seen by Cardiology and Nephrology, renal biopsy was consistent with FSG with severe arteriosclerosis and moterate to severe tubulointerstitial scarring. She continues to follow in the hematology clinic with anemia in the setting of SLE and chronic kidney disease, treated with Aranesp.  She is here with her husband. I am told that her renal function has improved significantly since the summer, currently no plans for dialysis although she continues to follow up Dr. Moshe Cipro. She reports stable dyspnea on exertion, no palpitations.  Echocardiogram from June of this year showed mild to moderate LVH with LVEF 50-55%, restrictive diastolic filling pattern (grade 4), sclerotic aortic valve with moderate aortic regurgitation, mild mitral regurgitation, moderate left atrial enlargement, mildly to moderately dilated right ventricle, moderate to severe tricuspid regurgitation with PASP 61 mm mercury..  Carotid Dopplers from May of this year showed minimal atherosclerotic ICA plaquing, not hemodynamically significant.  ECG from June showed sinus rhythm with PAC. She has had no recently documented atrial flutter.  Coumadin is being followed through our Coumadin clinic, started in the hospital for treatment of upper extremity DVT. No duration was specified. With history of atrial flutter and CHADSVASC score of 4, will plan to continue.  No Known Allergies  Current Outpatient Prescriptions  Medication Sig Dispense Refill  . acetaminophen (TYLENOL) 500 MG tablet Take 1,000 mg by mouth every 6 (six) hours as needed. For pain      . albuterol (PROVENTIL  HFA;VENTOLIN HFA) 108 (90 BASE) MCG/ACT inhaler Inhale 2 puffs into the lungs every 6 (six) hours as needed. For shortness of breath      . amLODipine (NORVASC) 10 MG tablet Take 1 tablet (10 mg total) by mouth at bedtime.  30 tablet  0  . atorvastatin (LIPITOR) 40 MG tablet Take 40 mg by mouth daily.      . calcitRIOL (ROCALTROL) 0.25 MCG capsule Take 1 capsule (0.25 mcg total) by mouth daily.  30 capsule  0  . folic acid (FOLVITE) 1 MG tablet Take 1 mg by mouth daily.      . furosemide (LASIX) 80 MG tablet Take 80 mg by mouth 2 (two) times daily. Takes lasix 2 tablets am & 1 tablet pm on M,W,F and 1 tablet BID all other days      . HYDROcodone-acetaminophen (NORCO/VICODIN) 5-325 MG per tablet Take 1-2 tablets by mouth every 4 (four) hours as needed for moderate pain.  10 tablet  0  . hydroxychloroquine (PLAQUENIL) 200 MG tablet Take 200 mg by mouth 2 (two) times daily.      . insulin aspart (NOVOLOG) 100 UNIT/ML injection Inject into the skin 2 (two) times daily with a meal. Sliding scale      . Insulin Detemir (LEVEMIR) 100 UNIT/ML Pen Inject 10 Units into the skin 2 (two) times daily.      . metoprolol (LOPRESSOR) 50 MG tablet TAKE ONE TABLET BY MOUTH TWICE DAILY.  60 tablet  6  . Multiple Vitamins-Minerals (CENTRUM SILVER PO) Take 1 tablet by mouth daily.      . predniSONE (DELTASONE) 20 MG tablet Take 10 mg by mouth daily with breakfast.      . Vitamin D,  Ergocalciferol, (DRISDOL) 50000 UNITS CAPS capsule Take 50,000 Units by mouth every 7 (seven) days. Tuesday.      . warfarin (COUMADIN) 5 MG tablet Take 1 tablet daily except 1 1/2 tablet on M,W,F other days take 1 tablet       No current facility-administered medications for this visit.    Past Medical History  Diagnosis Date  . Asthma   . Essential hypertension, benign   . Type 2 diabetes mellitus   . SLE (systemic lupus erythematosus)   . Anemia   . Atrial flutter     TEE/DCCV December 2013 - on Xarelto [facility])  . CKD (chronic  kidney disease) stage 3, GFR 30-59 ml/min     Social History [name] reports that she has quit smoking. Her smoking use included Cigarettes. She has a 30 pack-year smoking history. She has never used smokeless tobacco. [name] reports that she does not drink alcohol.  Review of Systems No fevers or chills. Stable appetite. Other systems reviewed and negative except as outlined.  Physical Examination Filed Vitals:   07/05/14 1359  BP: 118/58  Pulse: 60   Filed Weights   07/05/14 1359  Weight: 202 lb (91.627 kg)    Obese woman, appears comfortable at rest.  HEENT: Conjunctiva and lids normal, oropharynx clear.  Neck: Supple, no elevated JVP or carotid bruits, no thyromegaly.  Lungs: Clear to auscultation, nonlabored breathing at rest.  Cardiac: Regular rate and rhythm, no S3, 2/6 systolic murmur, no pericardial rub.  Abdomen: Soft, nontender, bowel sounds present, no guarding or rebound.  Extremities: 1+ edema, distal pulses 2+.  Skin: Warm and dry.  Musculoskeletal: No kyphosis.  Neuropsychiatric: Alert and oriented x3, affect grossly appropriate.   Problem List and Plan   Atrial flutter Maintaining sinus rhythm. She is on Coumadin for diagnosis of upper extremity DVT made back in the summer, anticoagulation will be continued for stroke prophylaxis. CHADSVASC score is 4. Otherwise continue beta blocker.  Chronic diastolic heart failure Restrictive physiology, complicated by acute on chronic renal failure. Patient states that she is making good urine output on Lasix, weight is down 8 pounds from August. She has followup with nephrology soon, it may be that her diuretic regimen can be reduced if renal function continues to improve.  Aortic regurgitation Moderate by recent echocardiogram.    Satira Sark, M.D., F.A.C.C.

## 2014-07-05 NOTE — Assessment & Plan Note (Signed)
Moderate by recent echocardiogram.

## 2014-07-05 NOTE — Patient Instructions (Signed)
Your physician recommends that you schedule a follow-up appointment in: 6 weeks. Your physician has recommended you make the following change in your medication:  Stop aspirin. Continue all other medications the same.

## 2014-07-13 ENCOUNTER — Ambulatory Visit (HOSPITAL_COMMUNITY)
Admission: RE | Admit: 2014-07-13 | Discharge: 2014-07-13 | Disposition: A | Discharge status: Home / Self Care | Payer: Medicare Other | Source: Ambulatory Visit | Attending: Oncology | Admitting: Oncology

## 2014-07-13 ENCOUNTER — Other Ambulatory Visit (HOSPITAL_COMMUNITY): Payer: Self-pay | Admitting: Family Medicine

## 2014-07-13 DIAGNOSIS — M329 Systemic lupus erythematosus, unspecified: Secondary | ICD-10-CM | POA: Diagnosis present

## 2014-07-13 DIAGNOSIS — Z1231 Encounter for screening mammogram for malignant neoplasm of breast: Secondary | ICD-10-CM

## 2014-07-19 ENCOUNTER — Ambulatory Visit (HOSPITAL_COMMUNITY)
Admission: RE | Admit: 2014-07-19 | Discharge: 2014-07-19 | Disposition: A | Discharge status: Home / Self Care | Payer: Medicare Other | Source: Ambulatory Visit | Attending: Family Medicine | Admitting: Family Medicine

## 2014-07-19 DIAGNOSIS — Z1231 Encounter for screening mammogram for malignant neoplasm of breast: Secondary | ICD-10-CM | POA: Insufficient documentation

## 2014-07-26 ENCOUNTER — Ambulatory Visit (INDEPENDENT_AMBULATORY_CARE_PROVIDER_SITE_OTHER): Payer: Medicare Other | Admitting: *Deleted

## 2014-07-26 DIAGNOSIS — Z5181 Encounter for therapeutic drug level monitoring: Secondary | ICD-10-CM

## 2014-07-26 DIAGNOSIS — I4892 Unspecified atrial flutter: Secondary | ICD-10-CM

## 2014-07-26 LAB — POCT INR: INR: 2.8

## 2014-08-14 ENCOUNTER — Encounter (HOSPITAL_COMMUNITY): Payer: Medicare Other

## 2014-08-14 ENCOUNTER — Encounter (HOSPITAL_COMMUNITY): Payer: Medicare Other | Attending: Hematology and Oncology

## 2014-08-14 ENCOUNTER — Encounter (HOSPITAL_COMMUNITY): Payer: Self-pay

## 2014-08-14 VITALS — BP 138/89 | Temp 98.5°F | Resp 16 | Wt 208.0 lb

## 2014-08-14 DIAGNOSIS — I82621 Acute embolism and thrombosis of deep veins of right upper extremity: Secondary | ICD-10-CM

## 2014-08-14 DIAGNOSIS — J438 Other emphysema: Secondary | ICD-10-CM

## 2014-08-14 DIAGNOSIS — D631 Anemia in chronic kidney disease: Secondary | ICD-10-CM

## 2014-08-14 DIAGNOSIS — I10 Essential (primary) hypertension: Secondary | ICD-10-CM

## 2014-08-14 DIAGNOSIS — N189 Chronic kidney disease, unspecified: Secondary | ICD-10-CM | POA: Insufficient documentation

## 2014-08-14 DIAGNOSIS — N058 Unspecified nephritic syndrome with other morphologic changes: Secondary | ICD-10-CM | POA: Insufficient documentation

## 2014-08-14 DIAGNOSIS — N183 Chronic kidney disease, stage 3 (moderate): Secondary | ICD-10-CM | POA: Diagnosis present

## 2014-08-14 DIAGNOSIS — D638 Anemia in other chronic diseases classified elsewhere: Secondary | ICD-10-CM

## 2014-08-14 DIAGNOSIS — M329 Systemic lupus erythematosus, unspecified: Secondary | ICD-10-CM | POA: Diagnosis present

## 2014-08-14 DIAGNOSIS — L930 Discoid lupus erythematosus: Secondary | ICD-10-CM

## 2014-08-14 DIAGNOSIS — E119 Type 2 diabetes mellitus without complications: Secondary | ICD-10-CM

## 2014-08-14 LAB — CBC WITH DIFFERENTIAL/PLATELET
BASOS ABS: 0 10*3/uL (ref 0.0–0.1)
Basophils Relative: 0 % (ref 0–1)
EOS ABS: 0.1 10*3/uL (ref 0.0–0.7)
EOS PCT: 1 % (ref 0–5)
HCT: 30.8 % — ABNORMAL LOW (ref 36.0–46.0)
Hemoglobin: 10.3 g/dL — ABNORMAL LOW (ref 12.0–15.0)
Lymphocytes Relative: 26 % (ref 12–46)
Lymphs Abs: 2.1 10*3/uL (ref 0.7–4.0)
MCH: 29.9 pg (ref 26.0–34.0)
MCHC: 33.4 g/dL (ref 30.0–36.0)
MCV: 89.5 fL (ref 78.0–100.0)
MONO ABS: 0.7 10*3/uL (ref 0.1–1.0)
Monocytes Relative: 8 % (ref 3–12)
NEUTROS ABS: 5.3 10*3/uL (ref 1.7–7.7)
Neutrophils Relative %: 65 % (ref 43–77)
Platelets: 231 10*3/uL (ref 150–400)
RBC: 3.44 MIL/uL — ABNORMAL LOW (ref 3.87–5.11)
RDW: 13.1 % (ref 11.5–15.5)
WBC: 8.1 10*3/uL (ref 4.0–10.5)

## 2014-08-14 NOTE — Patient Instructions (Signed)
[facility] Discharge Instructions  RECOMMENDATIONS MADE BY THE CONSULTANT AND ANY TEST RESULTS WILL BE SENT TO YOUR REFERRING PHYSICIAN.  EXAM FINDINGS BY THE PHYSICIAN TODAY AND SIGNS OR SYMPTOMS TO REPORT TO CLINIC OR PRIMARY PHYSICIAN: Exam and findings as discussed by Dr. Barnet Glasgow.  If any issues with your labs we will contact you.    INSTRUCTIONS/FOLLOW-UP: Follow-up with labs and office visit in 3 months.  Will call you with your appointments.  Thank you for choosing [facility] to provide your oncology and hematology care.  To afford each patient quality time with our providers, please arrive at least 15 minutes before your scheduled appointment time.  With your help, our goal is to use those 15 minutes to complete the necessary work-up to ensure our physicians have the information they need to help with your evaluation and healthcare recommendations.    Effective January 1st, 2014, we ask that you re-schedule your appointment with our physicians should you arrive 10 or more minutes late for your appointment.  We strive to give you quality time with our providers, and arriving late affects you and other patients whose appointments are after yours.    Again, thank you for choosing [facility].  Our hope is that these requests will decrease the amount of time that you wait before being seen by our physicians.       _____________________________________________________________  Should you have questions after your visit to [facility], please contact our office at [phone] between the hours of 8:30 a.m. and 4:30 p.m.  Voicemails left after 4:30 p.m. will not be returned until the following business day.  For prescription refill requests, have your pharmacy contact our office with your prescription refill request.    _______________________________________________________________  We hope that we have given you very good  care.  You may receive a patient satisfaction survey in the mail, please complete it and return it as soon as possible.  We value your feedback!  _______________________________________________________________  Have you asked about our STAR program?  STAR stands for Survivorship Training and Rehabilitation, and this is a nationally recognized cancer care program that focuses on survivorship and rehabilitation.  Cancer and cancer treatments may cause problems, such as, pain, making you feel tired and keeping you from doing the things that you need or want to do. Cancer rehabilitation can help. Our goal is to reduce these troubling effects and help you have the best quality of life possible.  You may receive a survey from a nurse that asks questions about your current state of health.  Based on the survey results, all eligible patients will be referred to the [facility] program for an evaluation so we can better serve you!  A frequently asked questions sheet is available upon request.

## 2014-08-14 NOTE — Progress Notes (Signed)
[facility]  OFFICE PROGRESS NOTE  [name], MD [address] [place] [id number]  DIAGNOSIS: Focal segmental glomerulosclerosis without nephrosis or chronic glomerulonephritis  Anemia in chronic renal disease  Systemic lupus erythematosus  Other emphysema  Chief Complaint  Patient presents with  . anemia in chronic kidney disease    CURRENT THERAPY: Aranesp 500 mcg last given in August 2015, Feraheme on 03/20/2014, anemia of chronic disease in the setting of systemic lupus erythematosus and chronic renal failure currently on warfarin as well. Plaquenil and meloxicam. Renal biopsy on 03/20/2014 showed focal and segmental glomerulosclerosis after which at the checks it was discontinued. She has had an AV fistula revised in the left upper extremity and as of 05/08/2014 had not yet undergone hemodialysis.  INTERVAL HISTORY: SHENY CALENDINE 68 y.o. female returns for follow-up of anemia of chronic disease in the setting of systemic lupus erythematosus and chronic renal failure refusing Aranesp in the past because of financial considerations. She has not required hemodialysis. Primary problem is in the right knee with pain and occasional swelling. She denies any nausea, vomiting, diarrhea, constipation, melena, hematochezia, hematuria, lower extremity swelling or redness, chest pain, PND, orthopnea, skin rash, headache, or seizures.  MEDICAL HISTORY: Past Medical History  Diagnosis Date  . Asthma   . Essential hypertension, benign   . Type 2 diabetes mellitus   . SLE (systemic lupus erythematosus)   . Anemia   . Atrial flutter     TEE/DCCV December 2013 - on Xarelto [facility])  . CKD (chronic kidney disease) stage 3, GFR 30-59 ml/min   . Bone spur of toe     right  5th toe    INTERIM HISTORY: has Atrial flutter; Morbid obesity; Type 2 diabetes mellitus with diabetic neuropathy; SLE (systemic lupus erythematosus);  Essential hypertension, benign; COPD (chronic obstructive pulmonary disease); Aortic regurgitation; Folate deficiency; Anemia in chronic renal disease; Renal failure (ARF), acute on chronic; Chronic diastolic heart failure; Encounter for therapeutic drug monitoring; 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    ALLERGIES:  has No Known Allergies.  MEDICATIONS: has a current medication list which includes the following prescription(s): acetaminophen, albuterol, amlodipine, atorvastatin, brimonidine, calcitriol, diclofenac, folic acid, hydrocodone-acetaminophen, hydroxychloroquine, insulin aspart, insulin detemir, metoprolol, multiple vitamins-minerals, prednisone, travoprost (benzalkonium), vitamin d (ergocalciferol), warfarin, and furosemide.  SURGICAL HISTORY:  Past Surgical History  Procedure Laterality Date  . Ankle surgery  1993  . Back surgery  1980  . Abdominal hysterectomy  1983  . Tee without cardioversion  09/16/2012    Procedure: TRANSESOPHAGEAL ECHOCARDIOGRAM (TEE);  Surgeon: Sanda Klein, MD;  Location: [place];  Service: Cardiovascular;  Laterality: N/A;  . Cardioversion  09/16/2012    Procedure: CARDIOVERSION;  Surgeon: Sanda Klein, MD;  Location: [facility] ENDOSCOPY;  Service: Cardiovascular;  Laterality: N/A;  . Esophagogastroduodenoscopy  09/20/2012    Procedure: ESOPHAGOGASTRODUODENOSCOPY (EGD);  Surgeon: Cleotis Nipper, MD;  Location: [facility] ENDOSCOPY;  Service: Endoscopy;  Laterality: Left;  . Colonoscopy  09/20/2012    Procedure: COLONOSCOPY;  Surgeon: Cleotis Nipper, MD;  Location: [facility] ENDOSCOPY;  Service: Endoscopy;  Laterality: Left;  . Av fistula placement Left 03/27/2014    Procedure: ARTERIOVENOUS (AV) FISTULA CREATION;  Surgeon: Rosetta Posner, MD;  Location: [place];  Service: Vascular;  Laterality: Left;    FAMILY  HISTORY: family history includes Diabetes  in her brother, father, and sister; Hypertension in her brother, father, and sister; Stroke in her mother.  SOCIAL HISTORY:  reports that she has quit smoking. Her smoking use included Cigarettes. She has a 30 pack-year smoking history. She has never used smokeless tobacco. She reports that she does not drink alcohol or use illicit drugs.  REVIEW OF SYSTEMS:  Other than that discussed above is noncontributory.  PHYSICAL EXAMINATION: ECOG PERFORMANCE STATUS: 1 - Symptomatic but completely ambulatory  Blood pressure 138/89, temperature 98.5 F (36.9 C), temperature source Oral, resp. rate 16, weight 208 lb (94.348 kg).  GENERAL:alert, no distress and comfortable. Morbidly obese. SKIN: skin color, texture, turgor are normal, no rashes or significant lesions EYES: PERLA; Conjunctiva are pink and non-injected, sclera clear SINUSES: No redness or tenderness over maxillary or ethmoid sinuses OROPHARYNX:no exudate, no erythema on lips, buccal mucosa, or tongue. NECK: supple, thyroid normal size, non-tender, without nodularity. No masses CHEST: normal AP diameter with no breast masses. LYMPH:  no palpable lymphadenopathy in the cervical, axillary or inguinal LUNGS: clear to auscultation and percussion with normal breathing effort HEART: regular rate & rhythm and no murmurs. ABDOMEN:abdomen soft, non-tender and normal bowel sounds. No organomegaly, ascites, or CVA tenderness. MUSCULOSKELETAL:no cyanosis of digits and no clubbing. Range of motion normal. . Left upper extremity AV fistula with acceptable bruit. NEURO: alert & oriented x 3 with fluent speech, no focal motor/sensory deficits   LABORATORY DATA:  Results for MORINE, LINGLEY (MRN [id number]) as of 08/14/2014 13:25  Ref. Range 03/24/2014 05:10 03/25/2014 04:59 03/26/2014 06:03 03/27/2014 04:45 03/28/2014 06:29  Creatinine Latest Range: 0.50-1.10 mg/dL 3.17 (H) 3.09 (H) 2.78 (H) 2.55 (H) 2.54 (H)     Results for GAYLEE, DOORLEY (MRN [id number]) as of 08/14/2014 13:25  Ref. Range 03/23/2014 03:37 03/27/2014 04:45 04/06/2014 10:50 05/08/2014 10:00 07/03/2014 10:48  Hemoglobin Latest Range: 12.0-15.0 g/dL 10.4 (L) 9.4 (L) 9.8 (L) 10.6 (L) 10.7 (L)    Appointment on 08/14/2014  Component Date Value Ref Range Status  . WBC 08/14/2014 8.1  4.0 - 10.5 K/uL Final  . RBC 08/14/2014 3.44* 3.87 - 5.11 MIL/uL Final  . Hemoglobin 08/14/2014 10.3* 12.0 - 15.0 g/dL Final  . HCT 08/14/2014 30.8* 36.0 - 46.0 % Final  . MCV 08/14/2014 89.5  78.0 - 100.0 fL Final  . MCH 08/14/2014 29.9  26.0 - 34.0 pg Final  . MCHC 08/14/2014 33.4  30.0 - 36.0 g/dL Final  . RDW 08/14/2014 13.1  11.5 - 15.5 % Final  . Platelets 08/14/2014 231  150 - 400 K/uL Final  . Neutrophils Relative % 08/14/2014 65  43 - 77 % Final  . Neutro Abs 08/14/2014 5.3  1.7 - 7.7 K/uL Final  . Lymphocytes Relative 08/14/2014 26  12 - 46 % Final  . Lymphs Abs 08/14/2014 2.1  0.7 - 4.0 K/uL Final  . Monocytes Relative 08/14/2014 8  3 - 12 % Final  . Monocytes Absolute 08/14/2014 0.7  0.1 - 1.0 K/uL Final  . Eosinophils Relative 08/14/2014 1  0 - 5 % Final  . Eosinophils Absolute 08/14/2014 0.1  0.0 - 0.7 K/uL Final  . Basophils Relative 08/14/2014 0  0 - 1 % Final  . Basophils Absolute 08/14/2014 0.0  0.0 - 0.1 K/uL Final  Anti-coag visit on 07/26/2014  Component Date Value Ref Range Status  . INR 07/26/2014 2.8   Final    PATHOLOGY:no new pathology.  Urinalysis    Component Value Date/Time   COLORURINE AMBER*  03/17/2014 1830   APPEARANCEUR CLOUDY* 03/17/2014 1830   LABSPEC 1.023 03/17/2014 1830   PHURINE 5.0 03/17/2014 1830   GLUCOSEU NEGATIVE 03/17/2014 1830   HGBUR MODERATE* 03/17/2014 1830   BILIRUBINUR SMALL* 03/17/2014 1830   KETONESUR NEGATIVE 03/17/2014 1830   PROTEINUR >300* 03/17/2014 1830   UROBILINOGEN 1.0 03/17/2014 1830   NITRITE NEGATIVE 03/17/2014 1830   LEUKOCYTESUR LARGE* 03/17/2014 1830     RADIOGRAPHIC STUDIES: Mm Digital Screening Bilateral  07/19/2014   CLINICAL DATA:  Screening.  EXAM: DIGITAL SCREENING BILATERAL MAMMOGRAM WITH CAD  COMPARISON:  Previous exam(s).  ACR Breast Density Category b: There are scattered areas of fibroglandular density.  FINDINGS: There are no findings suspicious for malignancy. Images were processed with CAD.  IMPRESSION: No mammographic evidence of malignancy. A result letter of this screening mammogram will be mailed directly to the patient.  RECOMMENDATION: Screening mammogram in one year. (Code:SM-B-01Y)  BI-RADS CATEGORY  1: Negative.   Electronically Signed   By: Abelardo Diesel M.D.   On: 07/19/2014 16:11    ASSESSMENT:  #1. Anemia of chronic disease in association with systemic lupus erythematosus, its treatment, and chronic kidney disease due to focal and segmental glomerulosclerosis and not lupus by kidney biopsy June 2015  #2. Systemic lupus erythematosus, on treatment with hydroxychloroquine.  #3. Diastolic heart failure with mild aortic stenosis.  #4. Hypertension, controlled.  #5. Diabetes mellitus, type II, non-insulin regarding, controlled.  #6. Chronic obstructive pulmonary disease . #7. Right upper extremity DVT, on warfarin, managed by cardiology   PLAN:  #1. If hemoglobin is less than 8, blood chest fusion will be recommended. The patient is not interested in receiving any erythrocyte stimulating agents. #2. Follow-up in 3 months with CBC and ferritin.   All questions were answered. The patient knows to call the clinic with any problems, questions or concerns. We can certainly see the patient much sooner if necessary.   I spent 25 minutes counseling the patient face to face. The total time spent in the appointment was 30 minutes.    Doroteo Bradford, MD 08/14/2014 2:49 PM  DISCLAIMER:  This note was dictated with voice recognition software.  Similar sounding words can inadvertently be transcribed inaccurately  and may not be corrected upon review.

## 2014-08-14 NOTE — Addendum Note (Signed)
Addended by: Mellissa Kohut on: 08/14/2014 06:09 PM   Modules accepted: Orders

## 2014-08-15 LAB — FERRITIN: Ferritin: 571 ng/mL — ABNORMAL HIGH (ref 10–291)

## 2014-08-16 ENCOUNTER — Encounter: Payer: Self-pay | Admitting: Cardiology

## 2014-08-16 ENCOUNTER — Ambulatory Visit (INDEPENDENT_AMBULATORY_CARE_PROVIDER_SITE_OTHER): Payer: Medicare Other | Admitting: Cardiology

## 2014-08-16 VITALS — BP 118/58 | HR 63 | Ht 65.0 in | Wt 208.0 lb

## 2014-08-16 DIAGNOSIS — I5032 Chronic diastolic (congestive) heart failure: Secondary | ICD-10-CM

## 2014-08-16 DIAGNOSIS — I351 Nonrheumatic aortic (valve) insufficiency: Secondary | ICD-10-CM

## 2014-08-16 DIAGNOSIS — I4892 Unspecified atrial flutter: Secondary | ICD-10-CM

## 2014-08-16 LAB — ERYTHROPOIETIN: Erythropoietin: 28.1 m[IU]/mL — ABNORMAL HIGH (ref 2.6–18.5)

## 2014-08-16 NOTE — Assessment & Plan Note (Signed)
Heart rate is regular today, no palpitations. CHADSVASC score is 4, therefore Coumadin will be continued long-term. Otherwise continue Lopressor.

## 2014-08-16 NOTE — Patient Instructions (Signed)
Your physician wants you to follow-up in: 3 months with Dr. Ferne Reus will receive a reminder letter in the mail two months in advance. If you don't receive a letter, please call our office to schedule the follow-up appointment.  Your physician recommends that you continue on your current medications as directed. Please refer to the Current Medication list given to you today.  Thank you for choosing [facility]!!

## 2014-08-16 NOTE — Progress Notes (Signed)
Reason for visit: History of atrial flutter, hypertension, aortic regurgitation  Clinical Summary Ms. Kill is a medically complex 68 y.o.female last seen in October. She continues to follow in the Oncology clinic - I reviewed the recent note. She is here with her husband today for a follow-up visit. From a cardiac perspective, she has been stable without palpitations or chest pain. She continues on Coumadin which was started due to upper extremity DVT, but we have continued for stroke prophylaxis in light of history of atrial flutter and CHADSVASC score of 4.  Lasix has been uptitrated by her nephrologist, reportedly associated with stable renal function. Her weight is up about 6 pounds.  Echocardiogram from June of this year showed mild to moderate LVH with LVEF 50-55%, restrictive diastolic filling pattern (grade 4), sclerotic aortic valve with moderate aortic regurgitation, mild mitral regurgitation, moderate left atrial enlargement, mildly to moderately dilated right ventricle, moderate to severe tricuspid regurgitation with PASP 61 mm mercury..   No Known Allergies  Current Outpatient Prescriptions  Medication Sig Dispense Refill  . acetaminophen (TYLENOL) 500 MG tablet Take 1,000 mg by mouth every 6 (six) hours as needed. For pain    . albuterol (PROVENTIL HFA;VENTOLIN HFA) 108 (90 BASE) MCG/ACT inhaler Inhale 2 puffs into the lungs every 6 (six) hours as needed. For shortness of breath    . amLODipine (NORVASC) 10 MG tablet Take 1 tablet (10 mg total) by mouth at bedtime. 30 tablet 0  . atorvastatin (LIPITOR) 40 MG tablet Take 40 mg by mouth daily. 1/2 tablet 20 mg    . brimonidine (ALPHAGAN) 0.15 % ophthalmic solution Place 1 drop into the left eye 2 (two) times daily.    . calcitRIOL (ROCALTROL) 0.25 MCG capsule Take 1 capsule (0.25 mcg total) by mouth daily. 30 capsule 0  . diclofenac (VOLTAREN) 0.1 % ophthalmic solution Place 1 drop into the left eye daily. Uses at lunch time     . folic acid (FOLVITE) 1 MG tablet Take 1 mg by mouth daily.    . furosemide (LASIX) 80 MG tablet Take 80 mg by mouth 2 (two) times daily. Takes lasix 2 tablets am & 1 tablet pm    . HYDROcodone-acetaminophen (NORCO/VICODIN) 5-325 MG per tablet Take 1-2 tablets by mouth every 4 (four) hours as needed for moderate pain. 10 tablet 0  . hydroxychloroquine (PLAQUENIL) 200 MG tablet Take 200 mg by mouth 2 (two) times daily.    . insulin aspart (NOVOLOG) 100 UNIT/ML injection Inject into the skin 3 (three) times daily with meals. Sliding scale    . Insulin Detemir (LEVEMIR) 100 UNIT/ML Pen Inject 10 Units into the skin at bedtime.     . metoprolol (LOPRESSOR) 50 MG tablet TAKE ONE TABLET BY MOUTH TWICE DAILY. 60 tablet 6  . Multiple Vitamins-Minerals (CENTRUM SILVER PO) Take 1 tablet by mouth daily.    . predniSONE (DELTASONE) 20 MG tablet Take 10 mg by mouth daily with breakfast. Taking 10 mg every other day , when completes current rx will take 5 mg daily    . travoprost, benzalkonium, (TRAVATAN) 0.004 % ophthalmic solution Place 1 drop into the left eye at bedtime.    . Vitamin D, Ergocalciferol, (DRISDOL) 50000 UNITS CAPS capsule Take 50,000 Units by mouth every 7 (seven) days. Tuesday.    . warfarin (COUMADIN) 5 MG tablet Take 5 mg by mouth. Take 1 tablet daily except 1 1/2 tablet on M,W,F, saturday other days take 1 tablet  No current facility-administered medications for this visit.    Past Medical History  Diagnosis Date  . Asthma   . Essential hypertension, benign   . Type 2 diabetes mellitus   . SLE (systemic lupus erythematosus)   . Anemia   . Atrial flutter     TEE/DCCV December 2013 - on Xarelto [facility])  . CKD (chronic kidney disease) stage 3, GFR 30-59 ml/min   . Bone spur of toe     right  5th toe    Social History Ms. Gierke reports that she has quit smoking. Her smoking use included Cigarettes. She has a 30 pack-year smoking history. She has never used smokeless  tobacco. Ms. Cheves reports that she does not drink alcohol.  Review of Systems Complete review of systems negative except as otherwise outlined in the clinical summary and also the following. Reports having some boils on her buttock, mildly tender. No fevers or chills. No reported bleeding problems. Stable appetite.  Physical Examination Filed Vitals:   08/16/14 1113  BP: 118/58  Pulse: 63   Filed Weights   08/16/14 1113  Weight: 208 lb (94.348 kg)    Obese woman, appears comfortable at rest.  HEENT: Conjunctiva and lids normal, oropharynx clear.  Neck: Supple, no elevated JVP or carotid bruits, no thyromegaly.  Lungs: Clear to auscultation, nonlabored breathing at rest.  Cardiac: Regular rate and rhythm, no S3, 2/6 systolic murmur, no pericardial rub.  Abdomen: Soft, nontender, bowel sounds present, no guarding or rebound.  Extremities: 1+ edema, distal pulses 2+.  Skin: Warm and dry.  Musculoskeletal: No kyphosis.  Neuropsychiatric: Alert and oriented x3, affect grossly appropriate.   Problem List and Plan   Atrial flutter Heart rate is regular today, no palpitations. CHADSVASC score is 4, therefore Coumadin will be continued long-term. Otherwise continue Lopressor.   Chronic diastolic heart failure Clinically stable. Lasix has been advanced since I last saw her per nephrology. No changes made today.  Aortic regurgitation Asymptomatic, moderate by echocardiogram in June.    Satira Sark, M.D., F.A.C.C.

## 2014-08-16 NOTE — Assessment & Plan Note (Signed)
Clinically stable. Lasix has been advanced since I last saw her per nephrology. No changes made today.

## 2014-08-16 NOTE — Assessment & Plan Note (Signed)
Asymptomatic, moderate by echocardiogram in June.

## 2014-08-21 ENCOUNTER — Ambulatory Visit (INDEPENDENT_AMBULATORY_CARE_PROVIDER_SITE_OTHER): Payer: Medicare Other | Admitting: *Deleted

## 2014-08-21 DIAGNOSIS — Z5181 Encounter for therapeutic drug level monitoring: Secondary | ICD-10-CM

## 2014-08-21 DIAGNOSIS — I4892 Unspecified atrial flutter: Secondary | ICD-10-CM

## 2014-08-21 LAB — POCT INR: INR: 1.8

## 2014-08-31 ENCOUNTER — Encounter: Payer: Medicare Other | Attending: "Endocrinology | Admitting: Nutrition

## 2014-08-31 ENCOUNTER — Encounter: Payer: Self-pay | Admitting: Nutrition

## 2014-08-31 VITALS — Ht 65.0 in | Wt 209.8 lb

## 2014-08-31 DIAGNOSIS — E118 Type 2 diabetes mellitus with unspecified complications: Secondary | ICD-10-CM | POA: Insufficient documentation

## 2014-08-31 DIAGNOSIS — Z794 Long term (current) use of insulin: Secondary | ICD-10-CM | POA: Insufficient documentation

## 2014-08-31 DIAGNOSIS — IMO0002 Reserved for concepts with insufficient information to code with codable children: Secondary | ICD-10-CM

## 2014-08-31 DIAGNOSIS — Z713 Dietary counseling and surveillance: Secondary | ICD-10-CM | POA: Insufficient documentation

## 2014-08-31 DIAGNOSIS — E1165 Type 2 diabetes mellitus with hyperglycemia: Secondary | ICD-10-CM

## 2014-08-31 NOTE — Patient Instructions (Signed)
Plan:  Aim for 2-3 Carb Choices per meal (30-45 grams) +/- 1 either way  Avoid snacks between meals Increase low carb vegetables with lunch and dinner meals. Avoid foods high in potassium and phosphorus; milk, dried beans, and other foods on list Include protein in moderation with your meals Consider reading food labels for Total Carbohydrate and Fat Grams of foods Consider  increasing your activity level by 15 minutes daily as tolerated Continue  checking BG before meals as directed by MD  Consider taking medication of Levemir and Novolog as directed by MD Goal: Get A1C down to 7% and maybe cut out Novolog with meals if BS improves enough. 3. Lose 1 lb per week. 4. Avoid foods high in potassium and phosphorus.

## 2014-08-31 NOTE — Progress Notes (Signed)
  Medical Nutrition Therapy:  Appt start time: 1330 end time:  1500.   Assessment:  Primary concerns today: DIabetes. LIves with her husband. Her husband is with her today. She does the cooking and shopping. Foods are baked and sometimes fried foods. [id number]. Before lunch or dinner 150's and in the 200's at bedtime. Exercise: does some walking but hasn't done a lot due to bone spur on the right foot. 10 units of Levemir at night and 5 units of Novolog with meals plus sliding scale. She has CKD and is on restricted diet of potassium and protein. Has not started dialysis. She is willing to work on eating better to continue to improve blood sugars and kidney function.  Preferred Learning Style:   Auditory  Visual  Hands on  No preference indicated   Learning Readiness:   Ready  Change in progress   MEDICATIONS: See list   DIETARY INTAKE:  24-hr recall:  B ( AM): 1 cup coffee, Fruit cup, Protein bar.  Snk ( AM): none;   L ( PM): [place] sandwich on ww bread with mayo, 4-5 chips,  Water  Snk ( PM): none D ( PM): [place] stew, 1 cup, 3-4 crackers,Sprite zero, Snk ( PM): peanut butter crackers, water  Beverages: water  Usual physical activity: limited   Estimated energy needs: 1200 calories 135 g carbohydrates 90 g protein 33 g fat  Progress Towards Goal(s):  In progress.   Nutritional Diagnosis:  NB-1.1 Food and nutrition-related knowledge deficit As related to Diabetes.  As evidenced by A1C 7.3%.    Intervention:  Nutrition counseling and diabetes education on diet, exercise, medications and complications.  Plan:  Aim for 2-3 Carb Choices per meal (30-45 grams) +/- 1 either way  Avoid snacks between meals Increase low carb vegetables with lunch and dinner meals. Avoid foods high in potassium and phosphorus; milk, dried beans, and other foods on list Include protein in moderation with your meals Consider reading food labels for Total Carbohydrate and Fat Grams  of foods Consider  increasing your activity level by 15 minutes daily as tolerated Continue  checking BG before meals as directed by MD  Consider taking medication of Levemir and Novolog as directed by MD Goal: Get A1C down to 7% and maybe cut out Novolog with meals if BS improves enough. 3. Lose 1 lb per week. 4. Avoid foods high in potassium and phosphorus.  Teaching Method Utilized:  Visual Auditory Hands on  Handouts given during visit include: The Plate Method Carb Counting and Food Label handouts Meal Plan Card   Barriers to learning/adherence to lifestyle change: none  Demonstrated degree of understanding via:  Teach Back   Monitoring/Evaluation:  Dietary intake, exercise, meal planning, SBG, and body weight in  1-2 month(s).

## 2014-09-11 ENCOUNTER — Ambulatory Visit (INDEPENDENT_AMBULATORY_CARE_PROVIDER_SITE_OTHER): Payer: Medicare Other | Admitting: *Deleted

## 2014-09-11 DIAGNOSIS — Z5181 Encounter for therapeutic drug level monitoring: Secondary | ICD-10-CM

## 2014-09-11 DIAGNOSIS — I4892 Unspecified atrial flutter: Secondary | ICD-10-CM

## 2014-09-11 LAB — POCT INR: INR: 1.4

## 2014-09-25 ENCOUNTER — Ambulatory Visit (INDEPENDENT_AMBULATORY_CARE_PROVIDER_SITE_OTHER): Payer: Medicare Other | Admitting: *Deleted

## 2014-09-25 DIAGNOSIS — I4892 Unspecified atrial flutter: Secondary | ICD-10-CM

## 2014-09-25 DIAGNOSIS — Z5181 Encounter for therapeutic drug level monitoring: Secondary | ICD-10-CM

## 2014-09-25 LAB — POCT INR: INR: 3.4

## 2014-09-25 MED ORDER — WARFARIN SODIUM 5 MG PO TABS: ORAL_TABLET | ORAL | Status: DC

## 2014-10-03 DIAGNOSIS — H4011X1 Primary open-angle glaucoma, mild stage: Secondary | ICD-10-CM | POA: Diagnosis not present

## 2014-10-03 DIAGNOSIS — H4011X3 Primary open-angle glaucoma, severe stage: Secondary | ICD-10-CM | POA: Diagnosis not present

## 2014-10-06 DIAGNOSIS — M810 Age-related osteoporosis without current pathological fracture: Secondary | ICD-10-CM | POA: Diagnosis not present

## 2014-10-06 DIAGNOSIS — M329 Systemic lupus erythematosus, unspecified: Secondary | ICD-10-CM | POA: Diagnosis not present

## 2014-10-06 DIAGNOSIS — M150 Primary generalized (osteo)arthritis: Secondary | ICD-10-CM | POA: Diagnosis not present

## 2014-10-06 DIAGNOSIS — Z7952 Long term (current) use of systemic steroids: Secondary | ICD-10-CM | POA: Diagnosis not present

## 2014-10-09 ENCOUNTER — Ambulatory Visit (INDEPENDENT_AMBULATORY_CARE_PROVIDER_SITE_OTHER): Payer: Medicare Other | Admitting: *Deleted

## 2014-10-09 DIAGNOSIS — I4892 Unspecified atrial flutter: Secondary | ICD-10-CM | POA: Diagnosis not present

## 2014-10-09 DIAGNOSIS — Z5181 Encounter for therapeutic drug level monitoring: Secondary | ICD-10-CM

## 2014-10-09 LAB — POCT INR: INR: 2.8

## 2014-10-10 DIAGNOSIS — J019 Acute sinusitis, unspecified: Secondary | ICD-10-CM | POA: Diagnosis not present

## 2014-10-10 DIAGNOSIS — Z6835 Body mass index (BMI) 35.0-35.9, adult: Secondary | ICD-10-CM | POA: Diagnosis not present

## 2014-10-10 DIAGNOSIS — M1000 Idiopathic gout, unspecified site: Secondary | ICD-10-CM | POA: Diagnosis not present

## 2014-10-10 DIAGNOSIS — E6609 Other obesity due to excess calories: Secondary | ICD-10-CM | POA: Diagnosis not present

## 2014-10-11 ENCOUNTER — Ambulatory Visit: Payer: Medicare Other | Admitting: Nutrition

## 2014-10-25 DIAGNOSIS — N179 Acute kidney failure, unspecified: Secondary | ICD-10-CM | POA: Diagnosis not present

## 2014-10-30 ENCOUNTER — Ambulatory Visit (INDEPENDENT_AMBULATORY_CARE_PROVIDER_SITE_OTHER): Payer: Medicare Other | Admitting: *Deleted

## 2014-10-30 DIAGNOSIS — I4892 Unspecified atrial flutter: Secondary | ICD-10-CM | POA: Diagnosis not present

## 2014-10-30 DIAGNOSIS — Z5181 Encounter for therapeutic drug level monitoring: Secondary | ICD-10-CM

## 2014-10-30 LAB — POCT INR: INR: 2.8

## 2014-11-01 DIAGNOSIS — N179 Acute kidney failure, unspecified: Secondary | ICD-10-CM | POA: Diagnosis not present

## 2014-11-01 DIAGNOSIS — N2581 Secondary hyperparathyroidism of renal origin: Secondary | ICD-10-CM | POA: Diagnosis not present

## 2014-11-01 DIAGNOSIS — D631 Anemia in chronic kidney disease: Secondary | ICD-10-CM | POA: Diagnosis not present

## 2014-11-01 DIAGNOSIS — N184 Chronic kidney disease, stage 4 (severe): Secondary | ICD-10-CM | POA: Diagnosis not present

## 2014-11-14 ENCOUNTER — Encounter (HOSPITAL_COMMUNITY): Payer: Medicare Other | Attending: Hematology & Oncology | Admitting: Hematology & Oncology

## 2014-11-14 ENCOUNTER — Encounter (HOSPITAL_BASED_OUTPATIENT_CLINIC_OR_DEPARTMENT_OTHER): Payer: Medicare Other

## 2014-11-14 VITALS — BP 130/46 | HR 58 | Temp 98.3°F | Resp 16 | Wt 204.7 lb

## 2014-11-14 DIAGNOSIS — N183 Chronic kidney disease, stage 3 unspecified: Secondary | ICD-10-CM

## 2014-11-14 DIAGNOSIS — E538 Deficiency of other specified B group vitamins: Secondary | ICD-10-CM | POA: Diagnosis not present

## 2014-11-14 DIAGNOSIS — N185 Chronic kidney disease, stage 5: Secondary | ICD-10-CM | POA: Diagnosis not present

## 2014-11-14 DIAGNOSIS — Z86718 Personal history of other venous thrombosis and embolism: Secondary | ICD-10-CM | POA: Diagnosis not present

## 2014-11-14 DIAGNOSIS — D649 Anemia, unspecified: Secondary | ICD-10-CM | POA: Diagnosis not present

## 2014-11-14 DIAGNOSIS — I4892 Unspecified atrial flutter: Secondary | ICD-10-CM | POA: Diagnosis not present

## 2014-11-14 DIAGNOSIS — D631 Anemia in chronic kidney disease: Secondary | ICD-10-CM

## 2014-11-14 DIAGNOSIS — M329 Systemic lupus erythematosus, unspecified: Secondary | ICD-10-CM | POA: Insufficient documentation

## 2014-11-14 DIAGNOSIS — N269 Renal sclerosis, unspecified: Secondary | ICD-10-CM | POA: Insufficient documentation

## 2014-11-14 DIAGNOSIS — Z87891 Personal history of nicotine dependence: Secondary | ICD-10-CM | POA: Diagnosis not present

## 2014-11-14 DIAGNOSIS — Z7901 Long term (current) use of anticoagulants: Secondary | ICD-10-CM | POA: Insufficient documentation

## 2014-11-14 DIAGNOSIS — N189 Chronic kidney disease, unspecified: Secondary | ICD-10-CM

## 2014-11-14 LAB — CBC
HEMATOCRIT: 31.1 % — AB (ref 36.0–46.0)
Hemoglobin: 10.1 g/dL — ABNORMAL LOW (ref 12.0–15.0)
MCH: 29.2 pg (ref 26.0–34.0)
MCHC: 32.5 g/dL (ref 30.0–36.0)
MCV: 89.9 fL (ref 78.0–100.0)
PLATELETS: 230 10*3/uL (ref 150–400)
RBC: 3.46 MIL/uL — ABNORMAL LOW (ref 3.87–5.11)
RDW: 14.1 % (ref 11.5–15.5)
WBC: 8 10*3/uL (ref 4.0–10.5)

## 2014-11-14 LAB — FERRITIN: Ferritin: 450 ng/mL — ABNORMAL HIGH (ref 10–291)

## 2014-11-14 NOTE — Progress Notes (Signed)
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 female returns for follow-up of anemia. She has lupus and FSG. She also has a history of folic acid deficiency. She has ongoing Coumadin use because of a history of an upper extremity DVT and atrial flutter. Coumadin is currently being continued for stroke prophylaxis. She has a fistula placed in preparation for dialysis.  She gets yearly mammograms and notes that she has been pruritic kidneys are improving. She is eating well and sleeping well.  MEDICAL HISTORY: Past Medical History  Diagnosis Date  . Asthma   . Essential hypertension, benign   . Type 2 diabetes mellitus   . SLE (systemic lupus erythematosus)   . Anemia   . Atrial flutter     TEE/DCCV December 2013 - on Xarelto [facility])  . CKD (chronic kidney disease) stage 3, GFR 30-59 ml/min   . 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     has No Known Allergies.  [name] does not currently have medications on file.  SURGICAL HISTORY: Past  Surgical History  Procedure Laterality Date  . Ankle surgery  1993  . Back surgery  1980  . Abdominal hysterectomy  1983  . Tee without cardioversion  09/16/2012    Procedure: TRANSESOPHAGEAL ECHOCARDIOGRAM (TEE);  Surgeon: Sanda Klein, MD;  Location: [place];  Service: Cardiovascular;  Laterality: N/A;  . Cardioversion  09/16/2012    Procedure: CARDIOVERSION;  Surgeon: Sanda Klein, MD;  Location: [facility] ENDOSCOPY;  Service: Cardiovascular;  Laterality: N/A;  . Esophagogastroduodenoscopy  09/20/2012    Procedure: ESOPHAGOGASTRODUODENOSCOPY (EGD);  Surgeon: Cleotis Nipper, MD;  Location: [facility] ENDOSCOPY;  Service: Endoscopy;  Laterality: Left;  . Colonoscopy  09/20/2012    Procedure: COLONOSCOPY;  Surgeon: Cleotis Nipper, MD;  Location: [facility] ENDOSCOPY;  Service: Endoscopy;  Laterality: Left;  . Av fistula placement Left 03/27/2014    Procedure: ARTERIOVENOUS (AV) FISTULA CREATION;  Surgeon: Rosetta Posner, MD;  Location: [place];  Service: Vascular;  Laterality: Left;    SOCIAL HISTORY: History   Social History  . Marital Status: Married    Spouse Name: N/A  . Number of Children: N/A  . Years of Education: N/A   Occupational History  . Not on file.   Social History Main Topics  . Smoking status: Former Smoker -- 1.00 packs/day for 30 years    Types: Cigarettes  . Smokeless tobacco: Never Used  Comment: quit 08-2012  . Alcohol Use: No  . Drug Use: No  . Sexual Activity: Yes   Other Topics Concern  . Not on file   Social History Narrative    FAMILY HISTORY: Family History  Problem Relation Age of Onset  . Diabetes Sister   . Diabetes Brother   . Diabetes Father   . Hypertension Father   . Hypertension Sister   . Hypertension Brother   . Stroke Mother     Review of Systems  Constitutional: Positive for malaise/fatigue. Negative for fever, chills and weight loss.  HENT: Negative for congestion, hearing loss, nosebleeds, sore throat and tinnitus.   Eyes:  Negative for blurred vision, double vision, pain and discharge.  Respiratory: Negative for cough, hemoptysis, sputum production, shortness of breath and wheezing.   Cardiovascular: Negative for chest pain, palpitations, claudication, leg swelling and PND.  Gastrointestinal: Negative for heartburn, nausea, vomiting, abdominal pain, diarrhea, constipation, blood in stool and melena.  Genitourinary: Negative for dysuria, urgency, frequency and hematuria.  Musculoskeletal: Positive for joint pain. Negative for myalgias and falls.  Skin: Negative for itching and rash.  Neurological: Negative for dizziness, tingling, tremors, sensory change, speech change, focal weakness, seizures, loss of consciousness, weakness and headaches.  Endo/Heme/Allergies: Does not bruise/bleed easily.  Psychiatric/Behavioral: Negative for depression, suicidal ideas, memory loss and substance abuse. The patient is not nervous/anxious and does not have insomnia.     PHYSICAL EXAMINATION  ECOG PERFORMANCE STATUS: 1 - Symptomatic but completely ambulatory  Filed Vitals:   11/14/14 1247  BP: 130/46  Pulse: 58  Temp: 98.3 F (36.8 C)  Resp: 16    Physical Exam  Constitutional: She is oriented to person, place, and time and well-developed, well-nourished, and in no distress.  HENT:  Head: Normocephalic and atraumatic.  Nose: Nose normal.  Mouth/Throat: Oropharynx is clear and moist. No oropharyngeal exudate.  Eyes: Conjunctivae and EOM are normal. Pupils are equal, round, and reactive to light. Right eye exhibits no discharge. Left eye exhibits no discharge. No scleral icterus.  Neck: Normal range of motion. Neck supple. No tracheal deviation present. No thyromegaly present.  Cardiovascular: Normal rate, regular rhythm and normal heart sounds.  Exam reveals no gallop and no friction rub.   No murmur heard. Pulmonary/Chest: Effort normal and breath sounds normal. She has no wheezes. She has no rales.  Abdominal:  Soft. Bowel sounds are normal. She exhibits no distension and no mass. There is no tenderness. There is no rebound and no guarding.  Musculoskeletal: Normal range of motion. She exhibits no edema.  Lymphadenopathy:    She has no cervical adenopathy.  Neurological: She is alert and oriented to person, place, and time. She has normal reflexes. No cranial nerve deficit. Gait normal. Coordination normal.  Skin: Skin is warm and dry. No rash noted.  Psychiatric: Mood, memory, affect and judgment normal.  Nursing note and vitals reviewed.   LABORATORY DATA:  CBC    Component Value Date/Time   WBC 8.0 11/14/2014 1213   RBC 3.46* 11/14/2014 1213   RBC 2.63* 02/16/2014 1400   HGB 10.1* 11/14/2014 1213   HCT 31.1* 11/14/2014 1213   PLT 230 11/14/2014 1213   MCV 89.9 11/14/2014 1213   MCH 29.2 11/14/2014 1213   MCHC 32.5 11/14/2014 1213   RDW 14.1 11/14/2014 1213   LYMPHSABS 2.1 08/14/2014 1300   MONOABS 0.7 08/14/2014 1300   EOSABS 0.1 08/14/2014 1300   BASOSABS 0.0 08/14/2014 1300   CMP  Component Value Date/Time   NA 139 03/28/2014 0629   K 3.4* 03/28/2014 0629   CL 96 03/28/2014 0629   CO2 31 03/28/2014 0629   GLUCOSE 111* 03/28/2014 0629   BUN 86* 03/28/2014 0629   CREATININE 2.54* 03/28/2014 0629   CALCIUM 8.2* 03/28/2014 0629   CALCIUM 7.0* 03/16/2014 0835   PROT 7.0 03/17/2014 1948   ALBUMIN 1.9* 03/28/2014 0629   AST 34 03/17/2014 1948   ALT 16 03/17/2014 1948   ALKPHOS 144* 03/17/2014 1948   BILITOT 1.1 03/17/2014 1948   GFRNONAA 18* 03/28/2014 0629   GFRAA 21* 03/28/2014 0629     ASSESSMENT and THERAPY PLAN:   Anemia  69 year old female with lupus and kidney disease, FSG. She has underlying anemia which is certainly multi factorial including folic acid deficiency, her chronic kidney disease, and anemia of chronic disease. Counts are currently stable. She will return in 3 months at which time I recommended completing her peripheral anemia evaluation to rule  out the possibility of other contributing causes. For now we will continue with observation.  All questions were answered. The patient knows to call the clinic with any problems, questions or concerns. We can certainly see the patient much sooner if necessary. This note was electronically signed.   Molli Hazard 11/14/2014

## 2014-11-14 NOTE — Progress Notes (Signed)
Labs for ferr,cbc

## 2014-11-14 NOTE — Patient Instructions (Signed)
[name] at [facility]  Discharge Instructions:  You saw Dr Whitney Muse today.  Follow up in 3 months with lab work.  Please call the clinic if you have any concerns or questions _______________________________________________________________  Thank you for choosing [place] at [facility] to provide your oncology and hematology care.  To afford each patient quality time with our providers, please arrive at least 15 minutes before your scheduled appointment.  You need to re-schedule your appointment if you arrive 10 or more minutes late.  We strive to give you quality time with our providers, and arriving late affects you and other patients whose appointments are after yours.  Also, if you no show three or more times for appointments you may be dismissed from the clinic.  Again, thank you for choosing [place] at [place] hope is that these requests will allow you access to exceptional care and in a timely manner. _______________________________________________________________  If you have questions after your visit, please contact our office at [phone] between the hours of 8:30 a.m. and 5:00 p.m. Voicemails left after 4:30 p.m. will not be returned until the following business day. _______________________________________________________________  For prescription refill requests, have your pharmacy contact our office. _______________________________________________________________  Recommendations made by the consultant and any test results will be sent to your referring physician. _______________________________________________________________

## 2014-11-15 ENCOUNTER — Encounter: Payer: Medicare Other | Attending: "Endocrinology | Admitting: Nutrition

## 2014-11-15 VITALS — Ht 65.0 in | Wt 206.0 lb

## 2014-11-15 DIAGNOSIS — Z794 Long term (current) use of insulin: Secondary | ICD-10-CM | POA: Diagnosis not present

## 2014-11-15 DIAGNOSIS — E118 Type 2 diabetes mellitus with unspecified complications: Secondary | ICD-10-CM | POA: Insufficient documentation

## 2014-11-15 DIAGNOSIS — E669 Obesity, unspecified: Secondary | ICD-10-CM

## 2014-11-15 DIAGNOSIS — E1165 Type 2 diabetes mellitus with hyperglycemia: Secondary | ICD-10-CM

## 2014-11-15 DIAGNOSIS — Z713 Dietary counseling and surveillance: Secondary | ICD-10-CM | POA: Insufficient documentation

## 2014-11-15 DIAGNOSIS — N184 Chronic kidney disease, stage 4 (severe): Secondary | ICD-10-CM

## 2014-11-15 DIAGNOSIS — IMO0002 Reserved for concepts with insufficient information to code with codable children: Secondary | ICD-10-CM

## 2014-11-15 NOTE — Progress Notes (Signed)
  Medical Nutrition Therapy:  Appt start time: 1630 end time:  1700   Assessment:  Primary concerns today: DIabetes Follow up and CKD. "My iron levels were up last time so I didn't need to get a shot last time." Fbs 102-160 before lunch 136-204 before dinner 91-356. BS higher at bedtime and before dinner mostly.  Lantus 15 unts and 5 unts with meals plus sliding scale. Lost 30 lbs in the last year.    Diet history reveals she is eating good quality foods but just inconsistent amounts at meals. BS are not as well controlled as they are up and down daily. Needs increased physical activity. She reports she is not having to go on dialysis anytime soon she doesn't think.   Preferred Learning Style:   Auditory  Visual  Hands on  No preference indicated   Learning Readiness:   Ready  Change in progress   MEDICATIONS: See list   DIETARY INTAKE:  24-hr recall:  B ( AM): Eggs, grits 1/2  and 1 slice white, coffee Snk ( AM): none;   L ( PM): Tomato sandwich and blueberries, fruit cup, water Snk ( PM):  D ( PM): Tomato/noodles,  Butter beans/corn, water Snk ( PM):  Crackers-saltines 2  Beverages: water  Usual physical activity: limited  Estimated energy needs: 1200 calories 135 g carbohydrates 90 g protein 33 g fat  Progress Towards Goal(s):  In progress.   Nutritional Diagnosis:  NB-1.1 Food and nutrition-related knowledge deficit As related to Diabetes.  As evidenced by A1C 7.3%.    Intervention:  Nutrition counseling and diabetes education on diet, exercise, medications and complications.  Plan:  1. Change grits to oatmeal 2. Increase low carb vegetables to 2 servings per meal of lunch or dinner. 3. Exercise by walking 30 minutes 4 days week. 4. Get A1C down to below 7% by next visit. 5. Avoid high potassium foods.  Teaching Method Utilized:  Visual Auditory Hands on  Handouts given during visit include: The Plate Method Carb Counting and Food Label  handouts Meal Plan Card   Barriers to learning/adherence to lifestyle change: none  Demonstrated degree of understanding via:  Teach Back   Monitoring/Evaluation:  Dietary intake, exercise, meal planning, SBG, and body weight in  1-2 month(s).

## 2014-11-15 NOTE — Patient Instructions (Signed)
Plan:  1. Change grits to oatmeal 2. Increase low carb vegetables to 2 servings per meal of lunch or dinner. 3. Exercise by walking 30 minutes 4 days week. 4. Get A1C down to below 7% by next visit

## 2014-11-27 DIAGNOSIS — E1122 Type 2 diabetes mellitus with diabetic chronic kidney disease: Secondary | ICD-10-CM | POA: Diagnosis not present

## 2014-11-27 DIAGNOSIS — I10 Essential (primary) hypertension: Secondary | ICD-10-CM | POA: Diagnosis not present

## 2014-11-27 DIAGNOSIS — E039 Hypothyroidism, unspecified: Secondary | ICD-10-CM | POA: Diagnosis not present

## 2014-11-27 DIAGNOSIS — E782 Mixed hyperlipidemia: Secondary | ICD-10-CM | POA: Diagnosis not present

## 2014-11-27 DIAGNOSIS — Z713 Dietary counseling and surveillance: Secondary | ICD-10-CM | POA: Diagnosis not present

## 2014-11-29 ENCOUNTER — Ambulatory Visit (INDEPENDENT_AMBULATORY_CARE_PROVIDER_SITE_OTHER): Payer: Medicare Other | Admitting: *Deleted

## 2014-11-29 ENCOUNTER — Ambulatory Visit (INDEPENDENT_AMBULATORY_CARE_PROVIDER_SITE_OTHER): Payer: Medicare Other | Admitting: Cardiology

## 2014-11-29 ENCOUNTER — Encounter: Payer: Self-pay | Admitting: Cardiology

## 2014-11-29 VITALS — BP 132/50 | HR 60 | Ht 65.0 in | Wt 204.8 lb

## 2014-11-29 DIAGNOSIS — I4892 Unspecified atrial flutter: Secondary | ICD-10-CM | POA: Diagnosis not present

## 2014-11-29 DIAGNOSIS — I10 Essential (primary) hypertension: Secondary | ICD-10-CM

## 2014-11-29 DIAGNOSIS — Z5181 Encounter for therapeutic drug level monitoring: Secondary | ICD-10-CM

## 2014-11-29 DIAGNOSIS — I483 Typical atrial flutter: Secondary | ICD-10-CM | POA: Diagnosis not present

## 2014-11-29 DIAGNOSIS — I351 Nonrheumatic aortic (valve) insufficiency: Secondary | ICD-10-CM | POA: Diagnosis not present

## 2014-11-29 LAB — POCT INR: INR: 2.9

## 2014-11-29 NOTE — Patient Instructions (Signed)
Your physician wants you to follow-up in: 6 months with Dr. McDowell You will receive a reminder letter in the mail two months in advance. If you don't receive a letter, please call our office to schedule the follow-up appointment.  Your physician recommends that you continue on your current medications as directed. Please refer to the Current Medication list given to you today.  Thank you for choosing  [facility]!!    

## 2014-11-29 NOTE — Progress Notes (Signed)
Cardiology Office Note  Date: 11/29/2014   ID: [name], DOB 1945-11-27, MRN [id number]  PCP: [name], MD  Primary Cardiologist: Dana Lesches, MD   Chief Complaint  Patient presents with  . History of atrial flutter  . Hypertension  . Aortic regurgitation    History of Present Illness: [name] is a 69 y.o. female last seen in November 2015. She continues to follow in the Oncology clinic and also with Nephrology. She has had no interval hospitalizations. From a cardiac perspective, she reports no significant palpitations or chest pain. She continues on beta blocker and Coumadin, follow-up continues in our anticoagulation clinic. She denies any spontaneous bleeding episodes and is due for an INR check today.  ECG from June of last year showed sinus rhythm with PAC and LVH.  Last echocardiogram is noted below as well.   Past Medical History  Diagnosis Date  . Asthma   . Essential hypertension, benign   . Type 2 diabetes mellitus   . SLE (systemic lupus erythematosus)   . Anemia   . Atrial flutter     TEE/DCCV December 2013 - on Xarelto [facility])  . CKD (chronic kidney disease) stage 3, GFR 30-59 ml/min   . Bone spur of toe     Right 5th toe    Past Surgical History  Procedure Laterality Date  . Ankle surgery  1993  . Back surgery  1980  . Abdominal hysterectomy  1983  . Tee without cardioversion  09/16/2012    Procedure: TRANSESOPHAGEAL ECHOCARDIOGRAM (TEE);  Surgeon: Dana Klein, MD;  Location: [place];  Service: Cardiovascular;  Laterality: N/A;  . Cardioversion  09/16/2012    Procedure: CARDIOVERSION;  Surgeon: Dana Klein, MD;  Location: [facility] ENDOSCOPY;  Service: Cardiovascular;  Laterality: N/A;  . Esophagogastroduodenoscopy  09/20/2012    Procedure: ESOPHAGOGASTRODUODENOSCOPY (EGD);  Surgeon: Dana Nipper, MD;  Location: [facility] ENDOSCOPY;  Service: Endoscopy;  Laterality: Left;  . Colonoscopy  09/20/2012    Procedure:  COLONOSCOPY;  Surgeon: Dana Nipper, MD;  Location: [facility] ENDOSCOPY;  Service: Endoscopy;  Laterality: Left;  . Av fistula placement Left 03/27/2014    Procedure: ARTERIOVENOUS (AV) FISTULA CREATION;  Surgeon: Dana Posner, MD;  Location[facility] OR;  Service: Vascular;  Laterality: Left;    Current Outpatient Prescriptions  Medication Sig Dispense Refill  . acetaminophen (TYLENOL) 500 MG tablet Take 1,000 mg by mouth every 6 (six) hours as needed. For pain    . albuterol (PROVENTIL HFA;VENTOLIN HFA) 108 (90 BASE) MCG/ACT inhaler Inhale 2 puffs into the lungs every 6 (six) hours as needed. For shortness of breath    . amLODipine (NORVASC) 10 MG tablet Take 1 tablet (10 mg total) by mouth at bedtime. 30 tablet 0  . atorvastatin (LIPITOR) 40 MG tablet Take 40 mg by mouth daily. 1/2 tablet 20 mg    . brimonidine (ALPHAGAN) 0.15 % ophthalmic solution Place 1 drop into the left eye 2 (two) times daily.    . calcitRIOL (ROCALTROL) 0.25 MCG capsule Take 1 capsule (0.25 mcg total) by mouth daily. (Patient taking differently: Take 0.25 mcg by mouth as directed. Takes on Mondays, Wednesdays and Fridays) 30 capsule 0  . diclofenac (VOLTAREN) 0.1 % ophthalmic solution Place 1 drop into the left eye daily. Uses at lunch time    . folic acid (FOLVITE) 1 MG tablet Take 1 mg by mouth daily.    . furosemide (LASIX) 80 MG tablet Take 80 mg by mouth  as directed. Takes lasix 2 tablets am & 1 tablet pm    . HYDROcodone-acetaminophen (NORCO/VICODIN) 5-325 MG per tablet Take 1-2 tablets by mouth every 4 (four) hours as needed for moderate pain. 10 tablet 0  . hydroxychloroquine (PLAQUENIL) 200 MG tablet Take 200 mg by mouth 2 (two) times daily.    . insulin aspart (NOVOLOG) 100 UNIT/ML injection Inject into the skin 3 (three) times daily with meals. Sliding scale    . Insulin Detemir (LEVEMIR) 100 UNIT/ML Pen Inject 15 Units into the skin at bedtime.     . metoprolol (LOPRESSOR) 50 MG tablet TAKE ONE TABLET BY MOUTH TWICE  DAILY. 60 tablet 6  . Multiple Vitamins-Minerals (CENTRUM SILVER PO) Take 1 tablet by mouth daily.    . predniSONE (DELTASONE) 20 MG tablet Take 10 mg by mouth as directed. Taking 10 mg every other day , when completes current rx will take 5 mg daily    . travoprost, benzalkonium, (TRAVATAN) 0.004 % ophthalmic solution Place 1 drop into the left eye at bedtime.    . Vitamin D, Ergocalciferol, (DRISDOL) 50000 UNITS CAPS capsule Take 50,000 Units by mouth every 7 (seven) days. Tuesday.    . warfarin (COUMADIN) 5 MG tablet Take 1 1/2 tablets daily except 2 tablets on Mondays 145 tablet 3   No current facility-administered medications for this visit.    Allergies:  Review of patient's allergies indicates no known allergies.   Social History: The patient  reports that she quit smoking about 2 years ago. Her smoking use included Cigarettes. She has a 30 pack-year smoking history. She has never used smokeless tobacco. She reports that she does not drink alcohol or use illicit drugs.   ROS:  Please see the history of present illness. Otherwise, complete review of systems is positive for NYHA class 2-3 shortness of breath which is chronic.  All other systems are reviewed and negative.    Physical Exam: VS:  BP 132/50 mmHg  Pulse 60  Ht 5\' 5"  (1.651 m)  Wt 204 lb 12.8 oz (92.897 kg)  BMI 34.08 kg/m2, BMI Body mass index is 34.08 kg/(m^2).  Wt Readings from Last 3 Encounters:  11/29/14 204 lb 12.8 oz (92.897 kg)  11/15/14 206 lb (93.441 kg)  11/14/14 204 lb 11.2 oz (92.851 kg)     Obese woman, appears comfortable at rest.  HEENT: Conjunctiva and lids normal, oropharynx clear.  Neck: Supple, no elevated JVP or carotid bruits, no thyromegaly.  Lungs: Clear to auscultation, nonlabored breathing at rest.  Cardiac: Regular rate and rhythm, no S3, 2/6 systolic murmur with soft diastolic murmur, no pericardial rub.  Abdomen: Soft, nontender, bowel sounds present, no guarding or rebound.   Extremities: 1+ edema, distal pulses 2+.  Skin: Warm and dry.  Musculoskeletal: No kyphosis.  Neuropsychiatric: Alert and oriented x3, affect grossly appropriate.   ECG: ECG is not ordered today.  Recent Labwork: 03/15/2014: Pro B Natriuretic peptide (BNP) 33778.0* 03/17/2014: ALT 16; AST 34 03/28/2014: BUN 86*; Creatinine 2.54*; Potassium 3.4*; Sodium 139 11/14/2014: Hemoglobin 10.1*; Platelets 230   Other Studies Reviewed Today:  Echocardiogram from June 2015 showed mild to moderate LVH with LVEF 50-55%, restrictive diastolic filling pattern (grade 4), sclerotic aortic valve with moderate aortic regurgitation, mild mitral regurgitation, moderate left atrial enlargement, mildly to moderately dilated right ventricle, moderate to severe tricuspid regurgitation with PASP 61 mm mercury.  ASSESSMENT AND PLAN:  1. History of atrial flutter status post prior cardioversion in 2013. She remains in sinus  rhythm on examination, complains of no palpitations. We will continue beta blocker and Coumadin.  2. Essential hypertension, blood pressure is fairly well controlled today. No changes were made.  3. Moderate aortic regurgitation, echocardiogram from June 2015 noted above. No change on examination.  Current medicines are reviewed at length with the patient today.  The patient does not have concerns regarding medicines.  Disposition: FU with me in 6 months.   Signed, Satira Sark, MD, [facility] 11/29/2014 9:58 AM    [place] [facility] [place]. [address], [place], [place] [address] Phone: [phone]; Fax: [phone]

## 2014-12-01 ENCOUNTER — Encounter (HOSPITAL_COMMUNITY): Payer: Self-pay | Admitting: Hematology & Oncology

## 2014-12-04 DIAGNOSIS — E1122 Type 2 diabetes mellitus with diabetic chronic kidney disease: Secondary | ICD-10-CM | POA: Diagnosis not present

## 2014-12-04 DIAGNOSIS — I10 Essential (primary) hypertension: Secondary | ICD-10-CM | POA: Diagnosis not present

## 2014-12-04 DIAGNOSIS — E782 Mixed hyperlipidemia: Secondary | ICD-10-CM | POA: Diagnosis not present

## 2014-12-04 DIAGNOSIS — E6609 Other obesity due to excess calories: Secondary | ICD-10-CM | POA: Diagnosis not present

## 2014-12-15 ENCOUNTER — Encounter: Payer: Self-pay | Admitting: Nutrition

## 2014-12-15 ENCOUNTER — Encounter: Payer: Medicare Other | Attending: "Endocrinology | Admitting: Nutrition

## 2014-12-15 VITALS — Ht 65.0 in | Wt 205.0 lb

## 2014-12-15 DIAGNOSIS — E118 Type 2 diabetes mellitus with unspecified complications: Secondary | ICD-10-CM | POA: Diagnosis not present

## 2014-12-15 DIAGNOSIS — Z713 Dietary counseling and surveillance: Secondary | ICD-10-CM | POA: Diagnosis not present

## 2014-12-15 DIAGNOSIS — Z794 Long term (current) use of insulin: Secondary | ICD-10-CM | POA: Diagnosis not present

## 2014-12-15 DIAGNOSIS — N184 Chronic kidney disease, stage 4 (severe): Secondary | ICD-10-CM

## 2014-12-15 DIAGNOSIS — E1165 Type 2 diabetes mellitus with hyperglycemia: Secondary | ICD-10-CM

## 2014-12-15 DIAGNOSIS — IMO0002 Reserved for concepts with insufficient information to code with codable children: Secondary | ICD-10-CM

## 2014-12-15 NOTE — Patient Instructions (Signed)
  Plan:  1. Try to avoid any canned meats Avoid snacks between meals. 2. Increase low carb vegetables to 2 servings with lunch and dinner each. 3. 30 minute of walking three times per week. 4. Lose 1lb per week. Add Protein of an egg or LF [place] with breakfast. 5. Get A1C down 7% in three months.

## 2014-12-15 NOTE — Progress Notes (Signed)
  Medical Nutrition Therapy:  Appt start time: 1500 end time:  1530  Assessment:  Primary concerns today: DIabetes Follow up and CKD.  Trying to eat more on a regular schedule. Taking 15 units of Levermir and 5 units of Humalog with meals.FBS 105-115 mg/dl. BS are flucating some during the day--higher at dinner and bedtime.. 200's sometimes.  Most recent A1C 7.5%, which is up from 7.3%. .  Tends to forget things from time to time. Has difficulty writing and seeing things close up but can see things far away.    Diet is better. Needs more lower carb veggies and avoid canned meats. Needs to increase walking and avoid snacks between meals and after supper.  Preferred Learning Style:   Auditory  Visual  Hands on  No preference indicated   Learning Readiness:   Ready  Change in progress   MEDICATIONS: See list   DIETARY INTAKE:  24-hr recall:  B ( AM): [employer] OR Oatmeal plain, coffee Snk ( AM): none;   L ( PM): Salmon patty sandwich, blueberries, 1/2 apple, water Snk ( PM): none D ( PM): Baked chicken, peas and green beans, water Snk ( PM):  Grapes. Beverages: water  Usual physical activity: limited  Estimated energy needs: 1200 calories 135 g carbohydrates 90 g protein 33 g fat  Progress Towards Goal(s):  In progress.   Nutritional Diagnosis:  NB-1.1 Food and nutrition-related knowledge deficit As related to Diabetes.  As evidenced by A1C 7.3%.    Intervention:  Nutrition counseling and diabetes education on diet, exercise, medications and complications.  Plan:  1. Try to avoid any canned meats Avoid snacks between meals. 2. Increase low carb vegetables to 2 servings with lunch and dinner. 3. 30 minute of walking three times per week. 4. Lose 1lb per week. Add Protein of an egg or LF [place] with breakfast. 5. Get A1C down 7% in three months.   Teaching Method Utilized:  Visual Auditory Hands on  Handouts given during visit include: The  Plate Method Carb Counting and Food Label handouts Meal Plan Card  Barriers to learning/adherence to lifestyle change: none  Demonstrated degree of understanding via:  Teach Back   Monitoring/Evaluation:  Dietary intake, exercise, meal planning, SBG, and body weight in  1-2 month(s).

## 2014-12-18 DIAGNOSIS — E1140 Type 2 diabetes mellitus with diabetic neuropathy, unspecified: Secondary | ICD-10-CM | POA: Diagnosis not present

## 2014-12-18 DIAGNOSIS — E1151 Type 2 diabetes mellitus with diabetic peripheral angiopathy without gangrene: Secondary | ICD-10-CM | POA: Diagnosis not present

## 2014-12-21 DIAGNOSIS — E11321 Type 2 diabetes mellitus with mild nonproliferative diabetic retinopathy with macular edema: Secondary | ICD-10-CM | POA: Diagnosis not present

## 2014-12-21 DIAGNOSIS — Z961 Presence of intraocular lens: Secondary | ICD-10-CM | POA: Diagnosis not present

## 2014-12-27 ENCOUNTER — Ambulatory Visit (HOSPITAL_COMMUNITY)
Admission: RE | Admit: 2014-12-27 | Discharge: 2014-12-27 | Disposition: A | Discharge status: Home / Self Care | Payer: Medicare Other | Source: Ambulatory Visit | Attending: Internal Medicine | Admitting: Internal Medicine

## 2014-12-27 ENCOUNTER — Other Ambulatory Visit (HOSPITAL_COMMUNITY): Payer: Self-pay | Admitting: Internal Medicine

## 2014-12-27 DIAGNOSIS — M109 Gout, unspecified: Secondary | ICD-10-CM | POA: Diagnosis not present

## 2014-12-27 DIAGNOSIS — X58XXXA Exposure to other specified factors, initial encounter: Secondary | ICD-10-CM | POA: Diagnosis not present

## 2014-12-27 DIAGNOSIS — M7731 Calcaneal spur, right foot: Secondary | ICD-10-CM | POA: Insufficient documentation

## 2014-12-27 DIAGNOSIS — M329 Systemic lupus erythematosus, unspecified: Secondary | ICD-10-CM | POA: Diagnosis not present

## 2014-12-27 DIAGNOSIS — I10 Essential (primary) hypertension: Secondary | ICD-10-CM | POA: Diagnosis not present

## 2014-12-27 DIAGNOSIS — M2011 Hallux valgus (acquired), right foot: Secondary | ICD-10-CM | POA: Diagnosis not present

## 2014-12-27 DIAGNOSIS — I4891 Unspecified atrial fibrillation: Secondary | ICD-10-CM | POA: Diagnosis not present

## 2014-12-27 DIAGNOSIS — S99921A Unspecified injury of right foot, initial encounter: Secondary | ICD-10-CM | POA: Diagnosis not present

## 2014-12-27 DIAGNOSIS — Z6835 Body mass index (BMI) 35.0-35.9, adult: Secondary | ICD-10-CM | POA: Diagnosis not present

## 2014-12-27 DIAGNOSIS — M19071 Primary osteoarthritis, right ankle and foot: Secondary | ICD-10-CM | POA: Insufficient documentation

## 2014-12-27 DIAGNOSIS — J449 Chronic obstructive pulmonary disease, unspecified: Secondary | ICD-10-CM | POA: Diagnosis not present

## 2015-01-01 DIAGNOSIS — H524 Presbyopia: Secondary | ICD-10-CM | POA: Diagnosis not present

## 2015-01-01 DIAGNOSIS — H4011X1 Primary open-angle glaucoma, mild stage: Secondary | ICD-10-CM | POA: Diagnosis not present

## 2015-01-01 DIAGNOSIS — H4011X3 Primary open-angle glaucoma, severe stage: Secondary | ICD-10-CM | POA: Diagnosis not present

## 2015-01-01 DIAGNOSIS — E11311 Type 2 diabetes mellitus with unspecified diabetic retinopathy with macular edema: Secondary | ICD-10-CM | POA: Diagnosis not present

## 2015-01-02 ENCOUNTER — Other Ambulatory Visit: Payer: Self-pay | Admitting: Cardiology

## 2015-01-02 ENCOUNTER — Other Ambulatory Visit: Payer: Self-pay | Admitting: *Deleted

## 2015-01-02 MED ORDER — METOPROLOL TARTRATE 50 MG PO TABS: 50.0000 mg | ORAL_TABLET | Freq: Two times a day (BID) | ORAL | Status: DC

## 2015-01-10 ENCOUNTER — Ambulatory Visit (INDEPENDENT_AMBULATORY_CARE_PROVIDER_SITE_OTHER): Payer: Medicare Other | Admitting: *Deleted

## 2015-01-10 DIAGNOSIS — Z5181 Encounter for therapeutic drug level monitoring: Secondary | ICD-10-CM | POA: Diagnosis not present

## 2015-01-10 DIAGNOSIS — I4892 Unspecified atrial flutter: Secondary | ICD-10-CM

## 2015-01-10 LAB — POCT INR: INR: 3.4

## 2015-01-15 DIAGNOSIS — E119 Type 2 diabetes mellitus without complications: Secondary | ICD-10-CM | POA: Diagnosis not present

## 2015-01-15 DIAGNOSIS — I10 Essential (primary) hypertension: Secondary | ICD-10-CM | POA: Diagnosis not present

## 2015-01-15 DIAGNOSIS — E6609 Other obesity due to excess calories: Secondary | ICD-10-CM | POA: Diagnosis not present

## 2015-01-15 DIAGNOSIS — K1121 Acute sialoadenitis: Secondary | ICD-10-CM | POA: Diagnosis not present

## 2015-01-15 DIAGNOSIS — E782 Mixed hyperlipidemia: Secondary | ICD-10-CM | POA: Diagnosis not present

## 2015-01-15 DIAGNOSIS — Z6835 Body mass index (BMI) 35.0-35.9, adult: Secondary | ICD-10-CM | POA: Diagnosis not present

## 2015-02-05 DIAGNOSIS — M329 Systemic lupus erythematosus, unspecified: Secondary | ICD-10-CM | POA: Diagnosis not present

## 2015-02-05 DIAGNOSIS — Z7952 Long term (current) use of systemic steroids: Secondary | ICD-10-CM | POA: Diagnosis not present

## 2015-02-05 DIAGNOSIS — M150 Primary generalized (osteo)arthritis: Secondary | ICD-10-CM | POA: Diagnosis not present

## 2015-02-05 DIAGNOSIS — M810 Age-related osteoporosis without current pathological fracture: Secondary | ICD-10-CM | POA: Diagnosis not present

## 2015-02-07 ENCOUNTER — Ambulatory Visit (INDEPENDENT_AMBULATORY_CARE_PROVIDER_SITE_OTHER): Payer: Medicare Other | Admitting: *Deleted

## 2015-02-07 DIAGNOSIS — Z5181 Encounter for therapeutic drug level monitoring: Secondary | ICD-10-CM

## 2015-02-07 DIAGNOSIS — I4892 Unspecified atrial flutter: Secondary | ICD-10-CM

## 2015-02-07 LAB — POCT INR: INR: 4.3

## 2015-02-08 DIAGNOSIS — E11321 Type 2 diabetes mellitus with mild nonproliferative diabetic retinopathy with macular edema: Secondary | ICD-10-CM | POA: Diagnosis not present

## 2015-02-12 ENCOUNTER — Encounter (HOSPITAL_COMMUNITY): Payer: Self-pay | Admitting: Hematology & Oncology

## 2015-02-12 ENCOUNTER — Encounter (HOSPITAL_BASED_OUTPATIENT_CLINIC_OR_DEPARTMENT_OTHER): Payer: Medicare Other | Admitting: Hematology & Oncology

## 2015-02-12 ENCOUNTER — Encounter (HOSPITAL_COMMUNITY): Payer: Medicare Other | Attending: Hematology & Oncology

## 2015-02-12 ENCOUNTER — Ambulatory Visit (HOSPITAL_COMMUNITY): Payer: Medicare Other | Admitting: Hematology & Oncology

## 2015-02-12 VITALS — BP 115/94 | HR 73 | Temp 98.5°F | Resp 18 | Wt 206.2 lb

## 2015-02-12 DIAGNOSIS — N179 Acute kidney failure, unspecified: Secondary | ICD-10-CM | POA: Diagnosis not present

## 2015-02-12 DIAGNOSIS — D631 Anemia in chronic kidney disease: Secondary | ICD-10-CM

## 2015-02-12 DIAGNOSIS — E538 Deficiency of other specified B group vitamins: Secondary | ICD-10-CM | POA: Insufficient documentation

## 2015-02-12 DIAGNOSIS — N189 Chronic kidney disease, unspecified: Secondary | ICD-10-CM | POA: Diagnosis not present

## 2015-02-12 DIAGNOSIS — D6862 Lupus anticoagulant syndrome: Secondary | ICD-10-CM | POA: Diagnosis not present

## 2015-02-12 DIAGNOSIS — N185 Chronic kidney disease, stage 5: Secondary | ICD-10-CM

## 2015-02-12 LAB — COMPREHENSIVE METABOLIC PANEL
ALK PHOS: 53 U/L (ref 38–126)
ALT: 39 U/L (ref 14–54)
AST: 31 U/L (ref 15–41)
Albumin: 3.1 g/dL — ABNORMAL LOW (ref 3.5–5.0)
Anion gap: 9 (ref 5–15)
BILIRUBIN TOTAL: 0.4 mg/dL (ref 0.3–1.2)
BUN: 81 mg/dL — ABNORMAL HIGH (ref 6–20)
CO2: 27 mmol/L (ref 22–32)
CREATININE: 2.25 mg/dL — AB (ref 0.44–1.00)
Calcium: 8.8 mg/dL — ABNORMAL LOW (ref 8.9–10.3)
Chloride: 106 mmol/L (ref 101–111)
GFR calc Af Amer: 24 mL/min — ABNORMAL LOW (ref >60)
GFR calc non Af Amer: 21 mL/min — ABNORMAL LOW (ref >60)
GLUCOSE: 136 mg/dL — AB (ref 65–99)
Potassium: 4.5 mmol/L (ref 3.5–5.1)
Sodium: 142 mmol/L (ref 135–145)
TOTAL PROTEIN: 7.2 g/dL (ref 6.5–8.1)

## 2015-02-12 LAB — CBC WITH DIFFERENTIAL/PLATELET
BASOS ABS: 0 10*3/uL (ref 0.0–0.1)
Basophils Relative: 0 % (ref 0–1)
Eosinophils Absolute: 0.1 10*3/uL (ref 0.0–0.7)
Eosinophils Relative: 1 % (ref 0–5)
HEMATOCRIT: 29.3 % — AB (ref 36.0–46.0)
HEMOGLOBIN: 9.4 g/dL — AB (ref 12.0–15.0)
LYMPHS PCT: 19 % (ref 12–46)
Lymphs Abs: 1.5 10*3/uL (ref 0.7–4.0)
MCH: 29.2 pg (ref 26.0–34.0)
MCHC: 32.1 g/dL (ref 30.0–36.0)
MCV: 91 fL (ref 78.0–100.0)
Monocytes Absolute: 0.4 10*3/uL (ref 0.1–1.0)
Monocytes Relative: 6 % (ref 3–12)
Neutro Abs: 5.8 10*3/uL (ref 1.7–7.7)
Neutrophils Relative %: 74 % (ref 43–77)
Platelets: 219 10*3/uL (ref 150–400)
RBC: 3.22 MIL/uL — ABNORMAL LOW (ref 3.87–5.11)
RDW: 14.7 % (ref 11.5–15.5)
WBC: 7.9 10*3/uL (ref 4.0–10.5)

## 2015-02-12 LAB — FERRITIN: Ferritin: 241 ng/mL (ref 11–307)

## 2015-02-12 LAB — IRON AND TIBC
Iron: 44 ug/dL (ref 28–170)
Saturation Ratios: 16 % (ref 10.4–31.8)
TIBC: 272 ug/dL (ref 250–450)
UIBC: 228 ug/dL

## 2015-02-12 LAB — FOLATE: Folate: 66 ng/mL (ref >5.9)

## 2015-02-12 LAB — VITAMIN B12: VITAMIN B 12: 1858 pg/mL — AB (ref 180–914)

## 2015-02-12 NOTE — Progress Notes (Signed)
[name], MD [place] [id number]    DIAGNOSIS:   Anemia Folic acid deficiency CKD Focal segmental glomerulosclerosis without nephrosis or chronic glomerulonephritis SLE Coumadin, history of UE DVT, a-flutter     CURRENT THERAPY: Skipper Cliche,         Aranesp last given on 04/06/2014    INTERVAL HISTORY: [name] 69 y.o. female returns for follow-up of anemia. She has lupus and FSG. She also has a history of folic acid deficiency. She has ongoing Coumadin use because of a history of an upper extremity DVT and atrial flutter. Coumadin is currently being continued for stroke prophylaxis. She has a fistula placed in preparation for dialysis.  She gets yearly mammograms. She is eating well and sleeping well.   Sees Dr. Amil Amen for rheumatology. She currently denies being on active therapy for her lupus. On chart review is is noted to be on plaquenil.  MEDICAL HISTORY: Past Medical History  Diagnosis Date  . Asthma   . Essential hypertension, benign   . Type 2 diabetes mellitus   . SLE (systemic lupus erythematosus)   . Anemia   . Atrial flutter     TEE/DCCV December 2013 - on Xarelto [facility])  . CKD (chronic kidney disease) stage 3, GFR 30-59 ml/min   . Bone spur of toe     Right 5th toe    has Atrial flutter; Morbid obesity; Type 2 diabetes mellitus with diabetic neuropathy; SLE (systemic lupus erythematosus); Essential hypertension, benign; COPD (chronic obstructive pulmonary disease); Aortic regurgitation; Folate deficiency; Anemia in chronic renal disease; Renal failure (ARF), acute on chronic; Chronic diastolic heart failure; Encounter for therapeutic drug monitoring; 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     has No Known Allergies.  [name] does not currently have  medications on file.  SURGICAL HISTORY: Past Surgical History  Procedure Laterality Date  . Ankle surgery  1993  . Back surgery  1980  . Abdominal hysterectomy  1983  . Tee without cardioversion  09/16/2012    Procedure: TRANSESOPHAGEAL ECHOCARDIOGRAM (TEE);  Surgeon: Sanda Klein, MD;  Location: [place];  Service: Cardiovascular;  Laterality: N/A;  . Cardioversion  09/16/2012    Procedure: CARDIOVERSION;  Surgeon: Sanda Klein, MD;  Location: [facility] ENDOSCOPY;  Service: Cardiovascular;  Laterality: N/A;  . Esophagogastroduodenoscopy  09/20/2012    Procedure: ESOPHAGOGASTRODUODENOSCOPY (EGD);  Surgeon: Cleotis Nipper, MD;  Location: [facility] ENDOSCOPY;  Service: Endoscopy;  Laterality: Left;  . Colonoscopy  09/20/2012    Procedure: COLONOSCOPY;  Surgeon: Cleotis Nipper, MD;  Location: [facility] ENDOSCOPY;  Service: Endoscopy;  Laterality: Left;  . Av fistula placement Left 03/27/2014    Procedure: ARTERIOVENOUS (AV) FISTULA CREATION;  Surgeon: Rosetta Posner, MD;  Location: [place];  Service: Vascular;  Laterality: Left;    SOCIAL HISTORY: History   Social History  . Marital Status: Married    Spouse Name: N/A  . Number of Children: N/A  . Years of Education: N/A   Occupational History  . Not on file.   Social History Main Topics  . Smoking status: Former Smoker -- 1.00 packs/day for 30 years    Types: Cigarettes    Quit date: 08/29/2012  . Smokeless tobacco: Never Used     Comment: quit 08-2012  . Alcohol Use: No  . Drug Use: No  . Sexual Activity: Yes  Other Topics Concern  . Not on file   Social History Narrative    FAMILY HISTORY: Family History  Problem Relation Age of Onset  . Diabetes Sister   . Diabetes Brother   . Diabetes Father   . Hypertension Father   . Hypertension Sister   . Hypertension Brother   . Stroke Mother     Review of Systems  Constitutional: Negative for fever, chills, fatigue/malaise and weight loss.  HENT: Negative for congestion,  hearing loss, nosebleeds, sore throat and tinnitus.   Eyes: Negative for blurred vision, double vision, pain and discharge.  Respiratory: Negative for cough, hemoptysis, sputum production, shortness of breath and wheezing.   Cardiovascular: Negative for chest pain, palpitations, claudication, leg swelling and PND.  Gastrointestinal: Negative for heartburn, nausea, vomiting, abdominal pain, diarrhea, constipation, blood in stool and melena.  Genitourinary: Negative for dysuria, urgency, frequency and hematuria.  Musculoskeletal: Positive for joint pain. Negative for myalgias and falls.  Skin: Negative for itching and rash.  Neurological: Negative for dizziness, tingling, tremors, sensory change, speech change, focal weakness, seizures, loss of consciousness, weakness and headaches.  Endo/Heme/Allergies: Does not bruise/bleed easily.  Psychiatric/Behavioral: Negative for depression, suicidal ideas, memory loss and substance abuse. The patient is not nervous/anxious and does not have insomnia.     PHYSICAL EXAMINATION  ECOG PERFORMANCE STATUS: 1 - Symptomatic but completely ambulatory  Filed Vitals:   02/12/15 1327  BP: 115/94  Pulse: 73  Temp: 98.5 F (36.9 C)  Resp: 18    Physical Exam  Constitutional: She is oriented to person, place, and time and well-developed, well-nourished, and in no distress.      Had a bit of difficulty getting onto the table by herself. HENT:      Dentures, top and bottom. Head: Normocephalic and atraumatic.  Nose: Nose normal.  Mouth/Throat: Oropharynx is clear and moist. No oropharyngeal exudate.  Eyes: Conjunctivae and EOM are normal. Pupils are equal, round, and reactive to light. Right eye exhibits no discharge. Left eye exhibits no discharge. No scleral icterus.  Neck: Normal range of motion. Neck supple. No tracheal deviation present. No thyromegaly present.  Cardiovascular: Normal rate, regular rhythm and normal heart sounds.  Exam reveals no  gallop and no friction rub.   No murmur heard. Pulmonary/Chest: Effort normal and breath sounds normal. She has no wheezes. She has no rales.  Abdominal: Soft. Bowel sounds are normal. She exhibits no distension and no mass. There is no tenderness. There is no rebound and no guarding.  Musculoskeletal: Normal range of motion. She exhibits no edema.  Lymphadenopathy:    She has no cervical adenopathy.  Neurological: She is alert and oriented to person, place, and time. She has normal reflexes. No cranial nerve deficit. Gait normal. Coordination normal.  Skin: Skin is warm and dry. No rash noted.  Psychiatric: Mood, memory, affect and judgment normal.  Nursing note and vitals reviewed.   LABORATORY DATA:  CBC    Component Value Date/Time   WBC 7.9 02/12/2015 1305   RBC 3.22* 02/12/2015 1305   RBC 2.63* 02/16/2014 1400   HGB 9.4* 02/12/2015 1305   HCT 29.3* 02/12/2015 1305   PLT 219 02/12/2015 1305   MCV 91.0 02/12/2015 1305   MCH 29.2 02/12/2015 1305   MCHC 32.1 02/12/2015 1305   RDW 14.7 02/12/2015 1305   LYMPHSABS 1.5 02/12/2015 1305   MONOABS 0.4 02/12/2015 1305   EOSABS 0.1 02/12/2015 1305   BASOSABS 0.0 02/12/2015 1305   CMP  Component Value Date/Time   NA 142 02/12/2015 1305   K 4.5 02/12/2015 1305   CL 106 02/12/2015 1305   CO2 27 02/12/2015 1305   GLUCOSE 136* 02/12/2015 1305   BUN 81* 02/12/2015 1305   CREATININE 2.25* 02/12/2015 1305   CALCIUM 8.8* 02/12/2015 1305   CALCIUM 7.0* 03/16/2014 0835   PROT 7.2 02/12/2015 1305   ALBUMIN 3.1* 02/12/2015 1305   AST 31 02/12/2015 1305   ALT 39 02/12/2015 1305   ALKPHOS 53 02/12/2015 1305   BILITOT 0.4 02/12/2015 1305   GFRNONAA 21* 02/12/2015 1305   GFRAA 24* 02/12/2015 1305     ASSESSMENT and THERAPY PLAN:   Anemia Lupus Kidney disease, FSG  70 year old female with lupus and kidney disease, FSG. She has underlying anemia which is certainly multi factorial including folic acid deficiency, her chronic  kidney disease, and anemia of chronic disease. Counts are slightly lower today. Did discuss the possibility in the future that she may need to resume Aranesp dosing. He is open to it if needed. I added several other labs to her studies today and we will apprised of the results when available. We will send results from her laboratory studies to her rheumatologist, Dr. Amil Amen.  She will return in 3 months with repeat laboratory studies and an office visit.  All questions were answered. The patient knows to call the clinic with any problems, questions or concerns. We can certainly see the patient much sooner if necessary. This note was electronically signed.  This document serves as a record of services personally performed by Ancil Linsey, MD. It was created on her behalf by Arlyce Harman, a trained medical scribe. The creation of this record is based on the scribe's personal observations and the provider's statements to them. This document has been checked and approved by the attending provider.  I have reviewed the above documentation for accuracy and completeness, and I agree with the above. Molli Hazard, MD

## 2015-02-12 NOTE — Patient Instructions (Signed)
..  [place] at [facility] Discharge Instructions  RECOMMENDATIONS MADE BY THE CONSULTANT AND ANY TEST RESULTS WILL BE SENT TO YOUR REFERRING PHYSICIAN. Labs every 6 weeks Return in 3 months  Thank you for choosing [place] at [facility] to provide your oncology and hematology care.  To afford each patient quality time with our provider, please arrive at least 15 minutes before your scheduled appointment time.    You need to re-schedule your appointment should you arrive 10 or more minutes late.  We strive to give you quality time with our providers, and arriving late affects you and other patients whose appointments are after yours.  Also, if you no show three or more times for appointments you may be dismissed from the clinic at the providers discretion.     Again, thank you for choosing [facility].  Our hope is that these requests will decrease the amount of time that you wait before being seen by our physicians.       _____________________________________________________________  Should you have questions after your visit to [facility], please contact our office at [phone] between the hours of 8:30 a.m. and 4:30 p.m.  Voicemails left after 4:30 p.m. will not be returned until the following business day.  For prescription refill requests, have your pharmacy contact our office.

## 2015-02-12 NOTE — Progress Notes (Signed)
Labs drawn

## 2015-02-13 LAB — PROTEIN ELECTROPHORESIS, SERUM
A/G RATIO SPE: 0.7 (ref 0.7–2.0)
Albumin ELP: 2.8 g/dL — ABNORMAL LOW (ref 3.2–5.6)
Alpha-1-Globulin: 0.3 g/dL (ref 0.1–0.4)
Alpha-2-Globulin: 0.9 g/dL (ref 0.4–1.2)
Beta Globulin: 1.1 g/dL (ref 0.6–1.3)
GLOBULIN, TOTAL: 3.8 g/dL (ref 2.0–4.5)
Gamma Globulin: 1.5 g/dL (ref 0.5–1.6)
Total Protein ELP: 6.6 g/dL (ref 6.0–8.5)

## 2015-02-14 LAB — IMMUNOFIXATION ELECTROPHORESIS
IGA: 225 mg/dL (ref 87–352)
IGM, SERUM: 82 mg/dL (ref 26–217)
IgG (Immunoglobin G), Serum: 1616 mg/dL — ABNORMAL HIGH (ref 700–1600)
TOTAL PROTEIN ELP: 6.6 g/dL (ref 6.0–8.5)

## 2015-02-21 ENCOUNTER — Ambulatory Visit (INDEPENDENT_AMBULATORY_CARE_PROVIDER_SITE_OTHER): Payer: Medicare Other | Admitting: *Deleted

## 2015-02-21 DIAGNOSIS — Z5181 Encounter for therapeutic drug level monitoring: Secondary | ICD-10-CM

## 2015-02-21 DIAGNOSIS — I4892 Unspecified atrial flutter: Secondary | ICD-10-CM | POA: Diagnosis not present

## 2015-02-21 LAB — POCT INR: INR: 2.2

## 2015-02-28 ENCOUNTER — Encounter: Payer: Medicare Other | Attending: "Endocrinology | Admitting: Nutrition

## 2015-02-28 VITALS — Ht 65.0 in | Wt 208.0 lb

## 2015-02-28 DIAGNOSIS — E118 Type 2 diabetes mellitus with unspecified complications: Secondary | ICD-10-CM | POA: Diagnosis not present

## 2015-02-28 DIAGNOSIS — E1165 Type 2 diabetes mellitus with hyperglycemia: Secondary | ICD-10-CM

## 2015-02-28 DIAGNOSIS — Z794 Long term (current) use of insulin: Secondary | ICD-10-CM | POA: Insufficient documentation

## 2015-02-28 DIAGNOSIS — Z713 Dietary counseling and surveillance: Secondary | ICD-10-CM | POA: Insufficient documentation

## 2015-02-28 DIAGNOSIS — IMO0002 Reserved for concepts with insufficient information to code with codable children: Secondary | ICD-10-CM

## 2015-02-28 DIAGNOSIS — E669 Obesity, unspecified: Secondary | ICD-10-CM

## 2015-02-28 NOTE — Patient Instructions (Signed)
Plan:  1. [facility] 2. Avoid snacks between meals. 3. Increase low carb vegetables to 2 servings with lunch and dinner. 4. Lose 1lb per week. Add Protein of an egg or LF [place] with breakfast. 5. Get A1C down 7% in three months.

## 2015-02-28 NOTE — Progress Notes (Signed)
BS log brought in. FBS: 83-203 mg/dl and before dinner or bedtime 128-295 mg/dl.

## 2015-02-28 NOTE — Progress Notes (Signed)
  Medical Nutrition Therapy:  Appt start time: 1100 end time:  1130  Assessment:  Primary concerns today: DIabetes Follow up and CKD.  Trying to eat more on a regular schedule. Taking 15 units of Levermir and 5 units of Humalog with meals.FBS 105-115 mg/dl. BS are flucating some during the day--higher at dinner and bedtime.. 200's sometimes.  Most recent A1C 7.5%, which is up from 7.3%. .  Tends to forget things from time to time. Has difficulty writing and seeing things close up but can see things far away.    Diet is better. Needs more lower carb veggies and avoid canned meats. Needs to increase walking and avoid snacks between meals and after supper. Has a bone spur in her right foot. Going to see Podiatrist.  Would benefit from Diabetic shoes.  Preferred Learning Style:   Auditory  Visual  Hands on  No preference indicated   Learning Readiness:   Ready  Change in progress   MEDICATIONS: See list   DIETARY INTAKE:  24-hr recall:  B ( AM): Oatmeal with applesauce, coffee, Snk ( AM): none;   L ( PM): [place] sandwich on white bread, 3 strawberries,  water Snk ( PM): none D ( PM): Stew beef and corn 1 c, water. Green beans Snk ( PM):  Pear Beverages: water  Usual physical activity: limited  Estimated energy needs: 1200 calories 135 g carbohydrates 90 g protein 33 g fat  Progress Towards Goal(s):  In progress.   Nutritional Diagnosis:  NB-1.1 Food and nutrition-related knowledge deficit As related to Diabetes.  As evidenced by A1C 7.3%.    Intervention:  Nutrition counseling and diabetes education on diet, exercise, medications and complications.  Plan:  1. [facility] 2. Avoid snacks between meals. 3. Increase low carb vegetables to 2 servings with lunch and dinner. 4. Lose 1lb per week. Add Protein of an egg or LF [place] with breakfast. 5. Get A1C down 7% in three months.  Teaching Method Utilized:   Visual Auditory Hands on  Handouts given during visit include: The Plate Method Carb Counting and Food Label handouts Meal Plan Card  Barriers to learning/adherence to lifestyle change: none  Demonstrated degree of understanding via:  Teach Back   Monitoring/Evaluation:  Dietary intake, exercise, meal planning, SBG, and body weight in  3 month(s).

## 2015-03-05 DIAGNOSIS — E1140 Type 2 diabetes mellitus with diabetic neuropathy, unspecified: Secondary | ICD-10-CM | POA: Diagnosis not present

## 2015-03-05 DIAGNOSIS — E1151 Type 2 diabetes mellitus with diabetic peripheral angiopathy without gangrene: Secondary | ICD-10-CM | POA: Diagnosis not present

## 2015-03-12 DIAGNOSIS — D631 Anemia in chronic kidney disease: Secondary | ICD-10-CM | POA: Diagnosis not present

## 2015-03-12 DIAGNOSIS — N184 Chronic kidney disease, stage 4 (severe): Secondary | ICD-10-CM | POA: Diagnosis not present

## 2015-03-12 DIAGNOSIS — M329 Systemic lupus erythematosus, unspecified: Secondary | ICD-10-CM | POA: Diagnosis not present

## 2015-03-12 DIAGNOSIS — N179 Acute kidney failure, unspecified: Secondary | ICD-10-CM | POA: Diagnosis not present

## 2015-03-13 DIAGNOSIS — I10 Essential (primary) hypertension: Secondary | ICD-10-CM | POA: Diagnosis not present

## 2015-03-13 DIAGNOSIS — E782 Mixed hyperlipidemia: Secondary | ICD-10-CM | POA: Diagnosis not present

## 2015-03-13 DIAGNOSIS — Z794 Long term (current) use of insulin: Secondary | ICD-10-CM | POA: Diagnosis not present

## 2015-03-13 DIAGNOSIS — E1122 Type 2 diabetes mellitus with diabetic chronic kidney disease: Secondary | ICD-10-CM | POA: Diagnosis not present

## 2015-03-13 LAB — HEMOGLOBIN A1C: HEMOGLOBIN A1C: 6.7 % — AB (ref 4.0–6.0)

## 2015-03-20 DIAGNOSIS — E6609 Other obesity due to excess calories: Secondary | ICD-10-CM | POA: Diagnosis not present

## 2015-03-20 DIAGNOSIS — E1122 Type 2 diabetes mellitus with diabetic chronic kidney disease: Secondary | ICD-10-CM | POA: Diagnosis not present

## 2015-03-20 DIAGNOSIS — E785 Hyperlipidemia, unspecified: Secondary | ICD-10-CM | POA: Diagnosis not present

## 2015-03-20 DIAGNOSIS — I10 Essential (primary) hypertension: Secondary | ICD-10-CM | POA: Diagnosis not present

## 2015-03-21 ENCOUNTER — Ambulatory Visit (INDEPENDENT_AMBULATORY_CARE_PROVIDER_SITE_OTHER): Payer: Medicare Other | Admitting: *Deleted

## 2015-03-21 DIAGNOSIS — Z5181 Encounter for therapeutic drug level monitoring: Secondary | ICD-10-CM

## 2015-03-21 DIAGNOSIS — I4892 Unspecified atrial flutter: Secondary | ICD-10-CM

## 2015-03-21 LAB — POCT INR: INR: 2.6

## 2015-03-26 ENCOUNTER — Encounter (HOSPITAL_COMMUNITY): Payer: Medicare Other | Attending: Hematology & Oncology

## 2015-03-26 DIAGNOSIS — D631 Anemia in chronic kidney disease: Secondary | ICD-10-CM | POA: Diagnosis not present

## 2015-03-26 DIAGNOSIS — E538 Deficiency of other specified B group vitamins: Secondary | ICD-10-CM | POA: Insufficient documentation

## 2015-03-26 DIAGNOSIS — N179 Acute kidney failure, unspecified: Secondary | ICD-10-CM | POA: Insufficient documentation

## 2015-03-26 DIAGNOSIS — E1140 Type 2 diabetes mellitus with diabetic neuropathy, unspecified: Secondary | ICD-10-CM | POA: Diagnosis not present

## 2015-03-26 DIAGNOSIS — M79671 Pain in right foot: Secondary | ICD-10-CM | POA: Diagnosis not present

## 2015-03-26 DIAGNOSIS — L89892 Pressure ulcer of other site, stage 2: Secondary | ICD-10-CM | POA: Diagnosis not present

## 2015-03-26 DIAGNOSIS — N189 Chronic kidney disease, unspecified: Secondary | ICD-10-CM

## 2015-03-26 LAB — COMPREHENSIVE METABOLIC PANEL
ALBUMIN: 3.1 g/dL — AB (ref 3.5–5.0)
ALK PHOS: 49 U/L (ref 38–126)
ALT: 23 U/L (ref 14–54)
ANION GAP: 9 (ref 5–15)
AST: 22 U/L (ref 15–41)
BUN: 73 mg/dL — AB (ref 6–20)
CALCIUM: 8.5 mg/dL — AB (ref 8.9–10.3)
CO2: 28 mmol/L (ref 22–32)
Chloride: 104 mmol/L (ref 101–111)
Creatinine, Ser: 2.27 mg/dL — ABNORMAL HIGH (ref 0.44–1.00)
GFR calc Af Amer: 24 mL/min — ABNORMAL LOW (ref >60)
GFR, EST NON AFRICAN AMERICAN: 21 mL/min — AB (ref >60)
Glucose, Bld: 138 mg/dL — ABNORMAL HIGH (ref 65–99)
POTASSIUM: 3.9 mmol/L (ref 3.5–5.1)
Sodium: 141 mmol/L (ref 135–145)
TOTAL PROTEIN: 7.2 g/dL (ref 6.5–8.1)
Total Bilirubin: 0.6 mg/dL (ref 0.3–1.2)

## 2015-03-26 LAB — CBC WITH DIFFERENTIAL/PLATELET
BASOS PCT: 0 % (ref 0–1)
Basophils Absolute: 0 10*3/uL (ref 0.0–0.1)
Eosinophils Absolute: 0.1 10*3/uL (ref 0.0–0.7)
Eosinophils Relative: 1 % (ref 0–5)
HCT: 28.3 % — ABNORMAL LOW (ref 36.0–46.0)
HEMOGLOBIN: 8.9 g/dL — AB (ref 12.0–15.0)
LYMPHS ABS: 1.3 10*3/uL (ref 0.7–4.0)
Lymphocytes Relative: 17 % (ref 12–46)
MCH: 28.3 pg (ref 26.0–34.0)
MCHC: 31.4 g/dL (ref 30.0–36.0)
MCV: 90.1 fL (ref 78.0–100.0)
MONO ABS: 0.4 10*3/uL (ref 0.1–1.0)
Monocytes Relative: 5 % (ref 3–12)
NEUTROS ABS: 6 10*3/uL (ref 1.7–7.7)
NEUTROS PCT: 77 % (ref 43–77)
Platelets: 243 10*3/uL (ref 150–400)
RBC: 3.14 MIL/uL — AB (ref 3.87–5.11)
RDW: 14.3 % (ref 11.5–15.5)
WBC: 7.8 10*3/uL (ref 4.0–10.5)

## 2015-03-26 NOTE — Progress Notes (Signed)
LABS DRAWN

## 2015-03-29 DIAGNOSIS — E11321 Type 2 diabetes mellitus with mild nonproliferative diabetic retinopathy with macular edema: Secondary | ICD-10-CM | POA: Diagnosis not present

## 2015-03-29 LAB — HM DIABETES EYE EXAM

## 2015-04-09 DIAGNOSIS — E1140 Type 2 diabetes mellitus with diabetic neuropathy, unspecified: Secondary | ICD-10-CM | POA: Diagnosis not present

## 2015-04-09 DIAGNOSIS — E1151 Type 2 diabetes mellitus with diabetic peripheral angiopathy without gangrene: Secondary | ICD-10-CM | POA: Diagnosis not present

## 2015-04-09 DIAGNOSIS — L89892 Pressure ulcer of other site, stage 2: Secondary | ICD-10-CM | POA: Diagnosis not present

## 2015-04-12 ENCOUNTER — Telehealth: Payer: Self-pay | Admitting: *Deleted

## 2015-04-12 NOTE — Telephone Encounter (Signed)
-----   Message from Satira Sark, MD sent at 04/12/2015  7:56 AM EDT ----- Got a notification from [facility] that this patient was placed on trimethoprim/sulfamethoxazole by Dr. Allene Pyo. Since she is on Coumadin, need to follow-up INR more closely. Thanks.

## 2015-04-12 NOTE — Telephone Encounter (Signed)
Called pt.  She has already finished Bactrim for toe infection.  Denies any s/s of bleeding.  INR appt moved up to 04/16/15  Pt in agreement.

## 2015-04-13 DIAGNOSIS — E6609 Other obesity due to excess calories: Secondary | ICD-10-CM | POA: Diagnosis not present

## 2015-04-13 DIAGNOSIS — Z6835 Body mass index (BMI) 35.0-35.9, adult: Secondary | ICD-10-CM | POA: Diagnosis not present

## 2015-04-13 DIAGNOSIS — M3210 Systemic lupus erythematosus, organ or system involvement unspecified: Secondary | ICD-10-CM | POA: Diagnosis not present

## 2015-04-16 ENCOUNTER — Ambulatory Visit (INDEPENDENT_AMBULATORY_CARE_PROVIDER_SITE_OTHER): Payer: Medicare Other | Admitting: *Deleted

## 2015-04-16 DIAGNOSIS — I4892 Unspecified atrial flutter: Secondary | ICD-10-CM | POA: Diagnosis not present

## 2015-04-16 DIAGNOSIS — N186 End stage renal disease: Secondary | ICD-10-CM | POA: Diagnosis not present

## 2015-04-16 DIAGNOSIS — Z48812 Encounter for surgical aftercare following surgery on the circulatory system: Secondary | ICD-10-CM

## 2015-04-16 DIAGNOSIS — Z5181 Encounter for therapeutic drug level monitoring: Secondary | ICD-10-CM

## 2015-04-16 LAB — POCT INR: INR: 2.9

## 2015-04-24 DIAGNOSIS — L89892 Pressure ulcer of other site, stage 2: Secondary | ICD-10-CM | POA: Diagnosis not present

## 2015-04-24 DIAGNOSIS — E1140 Type 2 diabetes mellitus with diabetic neuropathy, unspecified: Secondary | ICD-10-CM | POA: Diagnosis not present

## 2015-04-24 DIAGNOSIS — E1151 Type 2 diabetes mellitus with diabetic peripheral angiopathy without gangrene: Secondary | ICD-10-CM | POA: Diagnosis not present

## 2015-04-30 DIAGNOSIS — M150 Primary generalized (osteo)arthritis: Secondary | ICD-10-CM | POA: Diagnosis not present

## 2015-04-30 DIAGNOSIS — Z7952 Long term (current) use of systemic steroids: Secondary | ICD-10-CM | POA: Diagnosis not present

## 2015-04-30 DIAGNOSIS — M329 Systemic lupus erythematosus, unspecified: Secondary | ICD-10-CM | POA: Diagnosis not present

## 2015-04-30 DIAGNOSIS — M810 Age-related osteoporosis without current pathological fracture: Secondary | ICD-10-CM | POA: Diagnosis not present

## 2015-05-07 ENCOUNTER — Encounter (HOSPITAL_BASED_OUTPATIENT_CLINIC_OR_DEPARTMENT_OTHER): Payer: Medicare Other

## 2015-05-07 ENCOUNTER — Encounter (HOSPITAL_COMMUNITY): Payer: Self-pay | Admitting: Oncology

## 2015-05-07 ENCOUNTER — Encounter (HOSPITAL_COMMUNITY): Payer: Medicare Other | Attending: Hematology & Oncology | Admitting: Oncology

## 2015-05-07 VITALS — BP 157/49 | HR 68 | Temp 98.3°F | Resp 18 | Wt 210.0 lb

## 2015-05-07 DIAGNOSIS — N189 Chronic kidney disease, unspecified: Secondary | ICD-10-CM | POA: Diagnosis not present

## 2015-05-07 DIAGNOSIS — D631 Anemia in chronic kidney disease: Secondary | ICD-10-CM | POA: Diagnosis not present

## 2015-05-07 DIAGNOSIS — E538 Deficiency of other specified B group vitamins: Secondary | ICD-10-CM | POA: Diagnosis not present

## 2015-05-07 DIAGNOSIS — N179 Acute kidney failure, unspecified: Secondary | ICD-10-CM | POA: Diagnosis not present

## 2015-05-07 LAB — CBC WITH DIFFERENTIAL/PLATELET
BASOS ABS: 0 10*3/uL (ref 0.0–0.1)
Basophils Relative: 0 % (ref 0–1)
Eosinophils Absolute: 0 10*3/uL (ref 0.0–0.7)
Eosinophils Relative: 1 % (ref 0–5)
HEMATOCRIT: 26.7 % — AB (ref 36.0–46.0)
HEMOGLOBIN: 8.3 g/dL — AB (ref 12.0–15.0)
Lymphocytes Relative: 12 % (ref 12–46)
Lymphs Abs: 0.9 10*3/uL (ref 0.7–4.0)
MCH: 28 pg (ref 26.0–34.0)
MCHC: 31.1 g/dL (ref 30.0–36.0)
MCV: 90.2 fL (ref 78.0–100.0)
MONOS PCT: 4 % (ref 3–12)
Monocytes Absolute: 0.3 10*3/uL (ref 0.1–1.0)
NEUTROS PCT: 83 % — AB (ref 43–77)
Neutro Abs: 6.1 10*3/uL (ref 1.7–7.7)
PLATELETS: 253 10*3/uL (ref 150–400)
RBC: 2.96 MIL/uL — ABNORMAL LOW (ref 3.87–5.11)
RDW: 14.7 % (ref 11.5–15.5)
WBC: 7.3 10*3/uL (ref 4.0–10.5)

## 2015-05-07 LAB — COMPREHENSIVE METABOLIC PANEL
ALK PHOS: 47 U/L (ref 38–126)
ALT: 17 U/L (ref 14–54)
ANION GAP: 10 (ref 5–15)
AST: 19 U/L (ref 15–41)
Albumin: 3 g/dL — ABNORMAL LOW (ref 3.5–5.0)
BUN: 64 mg/dL — AB (ref 6–20)
CHLORIDE: 105 mmol/L (ref 101–111)
CO2: 28 mmol/L (ref 22–32)
Calcium: 8.4 mg/dL — ABNORMAL LOW (ref 8.9–10.3)
Creatinine, Ser: 2.2 mg/dL — ABNORMAL HIGH (ref 0.44–1.00)
GFR calc non Af Amer: 22 mL/min — ABNORMAL LOW (ref >60)
GFR, EST AFRICAN AMERICAN: 25 mL/min — AB (ref >60)
Glucose, Bld: 84 mg/dL (ref 65–99)
POTASSIUM: 3.9 mmol/L (ref 3.5–5.1)
SODIUM: 143 mmol/L (ref 135–145)
TOTAL PROTEIN: 7.2 g/dL (ref 6.5–8.1)
Total Bilirubin: 0.4 mg/dL (ref 0.3–1.2)

## 2015-05-07 LAB — FERRITIN: FERRITIN: 235 ng/mL (ref 11–307)

## 2015-05-07 NOTE — Progress Notes (Signed)
Dana Kilts, MD [place] [id number]  Anemia in chronic renal disease - Plan: CBC, Ferritin, Ferritin, Folate, Ferritin, Folate  Folate deficiency - Plan: CBC, Ferritin, Ferritin, Folate, Ferritin, Folate  CURRENT THERAPY: IV iron as needed and H/O ESA therapy in the past, held in past due to financial contraints, last given on 04/06/2014.  INTERVAL HISTORY: [name] 69 y.o. female returns for followup of SLE (followed by Dr. 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   I personally reviewed and went over laboratory results with the patient.  The results are noted within this dictation.  Labs are pending for and update today.  Her Hgb continues to decline.  Her hgb is down to 8.3 g/dL today.  Over the past 9 months, or greater, she has demonstrated a slow decline in her Hgb.    Given her decline, it is reasonable to restart ESA therapy in an attempt to avoid transfusion.  She is agreeable to this action plan.  I provided her education regarding anemia secondary to renal disease.  She is provided risks, benefits, alternatives, and side effects of ESA therapy.  She was given ESA in the past and this was discontinued due to financial constraints in the setting of an excellent response.  Risks, benefits, alternatives, and side effects of Aranesp provided today.   Past Medical History  Diagnosis Date  . Asthma   . Essential hypertension, benign   . Type 2 diabetes mellitus   . SLE (systemic lupus erythematosus)   . Anemia   . Atrial flutter     TEE/DCCV December 2013 - on Xarelto [facility])  . CKD (chronic kidney disease) stage 3, GFR 30-59 ml/min   . 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     has No Known Allergies.  Current Outpatient Prescriptions on File Prior to Visit  Medication Sig Dispense Refill  . acetaminophen (TYLENOL) 500 MG tablet Take 1,000 mg by mouth every 6 (six) hours as needed. For pain    . albuterol (PROVENTIL HFA;VENTOLIN HFA) 108 (90 BASE) MCG/ACT inhaler Inhale 2 puffs into the lungs every 6 (six) hours as needed. For shortness of breath    . amLODipine (NORVASC) 10 MG tablet Take 1 tablet (10 mg total) by mouth at bedtime. 30 tablet 0  . atorvastatin (LIPITOR) 40 MG tablet Take 40 mg by mouth daily. 1/2 tablet 20 mg    . brimonidine (ALPHAGAN) 0.15 % ophthalmic solution Place 1 drop into the left eye 2 (two) times daily.    . calcitRIOL (ROCALTROL) 0.25 MCG capsule Take 0.25 mcg by mouth as directed. Take on Mondays Wednesdays and Fridays    . diclofenac (VOLTAREN) 0.1 % ophthalmic solution Place 1 drop into the left eye daily. Uses at lunch time    . folic acid (FOLVITE) 1 MG tablet Take 1 mg by mouth daily.    . furosemide (LASIX) 80  MG tablet Take 80 mg by mouth as directed. Takes lasix 2 tablets am & 1 tablet pm    . HYDROcodone-acetaminophen (NORCO/VICODIN) 5-325 MG per tablet Take 1-2 tablets by mouth every 4 (four) hours as needed for moderate pain. 10 tablet 0  . hydroxychloroquine (PLAQUENIL) 200 MG tablet Take 200 mg by mouth 2 (two) times daily.    . insulin aspart (NOVOLOG) 100 UNIT/ML injection Inject into the skin 3 (three) times daily with meals. Sliding scale    . Insulin Detemir (LEVEMIR) 100  UNIT/ML Pen Inject 15 Units into the skin at bedtime.     . metoprolol (LOPRESSOR) 50 MG tablet Take 1 tablet (50 mg total) by mouth 2 (two) times daily. 60 tablet 6  . Multiple Vitamins-Minerals (CENTRUM SILVER PO) Take 1 tablet by mouth daily.    . predniSONE (DELTASONE) 20 MG tablet Take 10 mg by mouth as directed. Taking 10 mg every other day , when completes current rx will take 5 mg daily    . travoprost, benzalkonium, (TRAVATAN) 0.004 % ophthalmic solution Place 1 drop into the left eye at bedtime.    . Vitamin D, Ergocalciferol, (DRISDOL) 50000 UNITS CAPS capsule Take 50,000 Units by mouth every 7 (seven) days. Tuesday.    . warfarin (COUMADIN) 5 MG tablet Take 1 1/2 tablets daily except 2 tablets on Mondays (Patient taking differently: Take 1 1/2 tablets daily except 1 tablet on Monday and 1 on Thursday) 145 tablet 3   No current facility-administered medications on file prior to visit.    Past Surgical History  Procedure Laterality Date  . Ankle surgery  1993  . Back surgery  1980  . Abdominal hysterectomy  1983  . Tee without cardioversion  09/16/2012    Procedure: TRANSESOPHAGEAL ECHOCARDIOGRAM (TEE);  Surgeon: Sanda Klein, MD;  Location: [place];  Service: Cardiovascular;  Laterality: N/A;  . Cardioversion  09/16/2012    Procedure: CARDIOVERSION;  Surgeon: Sanda Klein, MD;  [facility] ENDOSCOPY;  Service: Cardiovascular;  Laterality: N/A;  . Esophagogastroduodenoscopy  09/20/2012    Procedure: ESOPHAGOGASTRODUODENOSCOPY (EGD);  Surgeon: Cleotis Nipper, MD;  Location: [facility] ENDOSCOPY;  Service: Endoscopy;  Laterality: Left;  . Colonoscopy  09/20/2012    Procedure: COLONOSCOPY;  Surgeon: Cleotis Nipper, MD;  [facility] END[facility]COPY;  Service: Endoscopy;  Laterality: Left;  . Av fistula placement Left 03/27/2014    Procedure: ARTERIOVENOUS (AV) FISTULA CREATION;  Surgeon: Rosetta Posner, MD;  Location[facility];  Service: Vascular;  Laterality: Left;  . Cardiovascular  stress test  12/19/2009    no stress induced rhythm abnormalities, ekg negative for ischemia  . Transesophageal echocardiogram with cardioversion  09/16/2012    EF 60-65%, moderate LVH, moderate regurg of the aortic valve, LA moderately dilated    Denies any headaches, dizziness, double vision, fevers, chills, night sweats, nausea, vomiting, diarrhea, constipation, chest pain, heart palpitations, shortness of breath, blood in stool, black tarry stool, urinary pain, urinary burning, urinary frequency, hematuria.   PHYSICAL EXAMINATION  ECOG PERFORMANCE STATUS: 1 - Symptomatic but completely ambulatory  Filed Vitals:   05/07/15 1300  BP: 157/49  Pulse: 68  Temp: 98.3 F (36.8 C)  Resp: 18    GENERAL:alert, no distress, comfortable, cooperative, obese, smiling and accompanied by husband SKIN: skin color, texture, turgor are normal, no rashes or significant lesions HEAD: Normocephalic, No masses, lesions, tenderness or abnormalities EYES: normal, PERRLA, EOMI, Conjunctiva are pink and non-injected EARS: External ears normal OROPHARYNX:lips, buccal mucosa, and tongue normal  and mucous membranes are moist  NECK: supple, thyroid normal size, non-tender, without nodularity, no stridor, non-tender, trachea midline LYMPH:  no palpable lymphadenopathy BREAST:not examined LUNGS: clear to auscultation  HEART: regular rate & rhythm ABDOMEN:abdomen soft, obese and normal bowel sounds BACK: Back symmetric, no curvature. EXTREMITIES:less then 2 second capillary refill, no joint deformities, effusion, or inflammation, no skin discoloration, no cyanosis  NEURO: alert & oriented x 3 with fluent speech, no focal motor/sensory deficits, gait normal   LABORATORY DATA: CBC    Component Value Date/Time   WBC 7.3 05/07/2015 1320   RBC 2.96* 05/07/2015 1320   RBC 2.63* 02/16/2014 1400   HGB 8.3* 05/07/2015 1320   HCT 26.7* 05/07/2015 1320   PLT 253 05/07/2015 1320   MCV 90.2 05/07/2015 1320    MCH 28.0 05/07/2015 1320   MCHC 31.1 05/07/2015 1320   RDW 14.7 05/07/2015 1320   LYMPHSABS 0.9 05/07/2015 1320   MONOABS 0.3 05/07/2015 1320   EOSABS 0.0 05/07/2015 1320   BASOSABS 0.0 05/07/2015 1320      Chemistry      Component Value Date/Time   NA 143 05/07/2015 1320   K 3.9 05/07/2015 1320   CL 105 05/07/2015 1320   CO2 28 05/07/2015 1320   BUN 64* 05/07/2015 1320   CREATININE 2.20* 05/07/2015 1320      Component Value Date/Time   CALCIUM 8.4* 05/07/2015 1320   CALCIUM 7.0* 03/16/2014 0835   ALKPHOS 47 05/07/2015 1320   AST 19 05/07/2015 1320   ALT 17 05/07/2015 1320   BILITOT 0.4 05/07/2015 1320      Lab Results  Component Value Date   FERRITIN 241 02/12/2015   Lab Results  Component Value Date   FOLATE 66.0 02/12/2015     PENDING LABS:   RADIOGRAPHIC STUDIES:  No results found.   PATHOLOGY:    ASSESSMENT AND PLAN:  Anemia in chronic renal disease Anemia of chronic renal disease with FSG in the setting of SLE (followed by Dr. Kandice Robinsons).  Labs today as ordered: CBC diff, CMET.  Hgb is noted to be 8.3 g/dL and her Hgb is trending downward.  She was previously on Aranesp at 500 mcg every 3 weeks with a great response to therapy.  This intervention was discontinued due to financial constraints and her Hgb has been observed for nearly 1 year.  I provided education regarding anemia from chronic renal disease.  She follows with Dr. Verner Chol.  She now sees Dr. Kandice Robinsons, Rheumatology.  She is currently on Plaquenil and Prednisone 10 mg daily.    Will re-initiate Aranesp at 100 mcg every 2 weeks.  Calculated dose is 75 mcg, but we do not stock 80 mcg.  Will adjust the dose according to response.  Added labs today prior to the start of Aranesp: Ferritin and Folate.  Labs every 2 weeks: CBC  Labs every 6 weeks: Ferritin  Return next week to start Aranesp and then every 2 weeks.  Return in 8 weeks for follow-up.    THERAPY PLAN:  Will restart Aranesp at  renal dosing.  Will check ferritin and folate prior to starting therapy.  All questions were answered. The patient knows to call the clinic with any problems, questions or concerns. We can certainly see the patient much sooner if necessary.  Patient and plan discussed with Dr. Ancil Linsey and she is in agreement with the aforementioned.   This note is electronically signed by: Doy Mince 05/07/2015 2:18 PM

## 2015-05-07 NOTE — Progress Notes (Signed)
LABS DRAWN

## 2015-05-07 NOTE — Assessment & Plan Note (Addendum)
Anemia of chronic renal disease with FSG in the setting of SLE (followed by Dr. Kandice Robinsons).  Labs today as ordered: CBC diff, CMET.  Hgb is noted to be 8.3 g/dL and her Hgb is trending downward.  She was previously on Aranesp at 500 mcg every 3 weeks with a great response to therapy.  This intervention was discontinued due to financial constraints and her Hgb has been observed for nearly 1 year.  I provided education regarding anemia from chronic renal disease.  She follows with Dr. Verner Chol.  She now sees Dr. Kandice Robinsons, Rheumatology.  She is currently on Plaquenil and Prednisone 10 mg daily.    Will re-initiate Aranesp at 100 mcg every 2 weeks.  Calculated dose is 75 mcg, but we do not stock 80 mcg.  Will adjust the dose according to response.  Added labs today prior to the start of Aranesp: Ferritin and Folate.  Labs every 2 weeks: CBC  Labs every 6 weeks: Ferritin  Return next week to start Aranesp and then every 2 weeks.  Return in 8 weeks for follow-up.

## 2015-05-07 NOTE — Progress Notes (Signed)
[name]'s reason for visit today is for labs as scheduled per MD orders.  Venipuncture performed with a 23 gauge butterfly needle to R Antecubital.  [name] tolerated procedure well and without incident; questions were answered and patient was discharged.

## 2015-05-07 NOTE — Addendum Note (Signed)
Addended by: Elenor Legato on: 05/07/2015 02:37 PM   Modules accepted: Medications

## 2015-05-07 NOTE — Patient Instructions (Addendum)
[place] at [facility] Discharge Instructions  RECOMMENDATIONS MADE BY THE CONSULTANT AND ANY TEST RESULTS WILL BE SENT TO YOUR REFERRING PHYSICIAN.  Exam completed by Kirby Crigler today. We will start aranesp next week, we have to get it authorized first. This shot will be given every 2 weeks. This shot will help increase your hemoglobin. Return in 8 weeks to see the doctor. Please call the clinic if you have any questions or concerns.   Thank you for choosing [place] at [facility] to provide your oncology and hematology care.  To afford each patient quality time with our provider, please arrive at least 15 minutes before your scheduled appointment time.    You need to re-schedule your appointment should you arrive 10 or more minutes late.  We strive to give you quality time with our providers, and arriving late affects you and other patients whose appointments are after yours.  Also, if you no show three or more times for appointments you may be dismissed from the clinic at the providers discretion.     Again, thank you for choosing [facility].  Our hope is that these requests will decrease the amount of time that you wait before being seen by our physicians.       _____________________________________________________________  Should you have questions after your visit to [facility], please contact our office at [phone] between the hours of 8:30 a.m. and 4:30 p.m.  Voicemails left after 4:30 p.m. will not be returned until the following business day.  For prescription refill requests, have your pharmacy contact our office.

## 2015-05-08 ENCOUNTER — Other Ambulatory Visit (HOSPITAL_COMMUNITY): Payer: Self-pay | Admitting: Emergency Medicine

## 2015-05-08 ENCOUNTER — Encounter: Payer: Self-pay | Admitting: Cardiology

## 2015-05-08 LAB — FOLATE: Folate: 75 ng/mL (ref >5.9)

## 2015-05-08 NOTE — Progress Notes (Signed)
Notified pt that iron studies were good and plan on aranesp next week.  Verbalized understanding

## 2015-05-10 DIAGNOSIS — E11321 Type 2 diabetes mellitus with mild nonproliferative diabetic retinopathy with macular edema: Secondary | ICD-10-CM | POA: Diagnosis not present

## 2015-05-14 ENCOUNTER — Ambulatory Visit (INDEPENDENT_AMBULATORY_CARE_PROVIDER_SITE_OTHER): Payer: Medicare Other | Admitting: *Deleted

## 2015-05-14 DIAGNOSIS — E1151 Type 2 diabetes mellitus with diabetic peripheral angiopathy without gangrene: Secondary | ICD-10-CM | POA: Diagnosis not present

## 2015-05-14 DIAGNOSIS — I4892 Unspecified atrial flutter: Secondary | ICD-10-CM

## 2015-05-14 DIAGNOSIS — Z5181 Encounter for therapeutic drug level monitoring: Secondary | ICD-10-CM | POA: Diagnosis not present

## 2015-05-14 DIAGNOSIS — E1140 Type 2 diabetes mellitus with diabetic neuropathy, unspecified: Secondary | ICD-10-CM | POA: Diagnosis not present

## 2015-05-14 DIAGNOSIS — L89892 Pressure ulcer of other site, stage 2: Secondary | ICD-10-CM | POA: Diagnosis not present

## 2015-05-14 LAB — POCT INR: INR: 2.1

## 2015-05-15 ENCOUNTER — Ambulatory Visit (HOSPITAL_COMMUNITY): Payer: Medicare Other | Admitting: Oncology

## 2015-05-15 ENCOUNTER — Other Ambulatory Visit (HOSPITAL_COMMUNITY): Payer: Medicare Other

## 2015-05-16 ENCOUNTER — Encounter (HOSPITAL_BASED_OUTPATIENT_CLINIC_OR_DEPARTMENT_OTHER): Payer: Medicare Other

## 2015-05-16 ENCOUNTER — Encounter (HOSPITAL_COMMUNITY): Payer: Self-pay

## 2015-05-16 VITALS — BP 165/50 | HR 72 | Temp 98.4°F | Resp 20

## 2015-05-16 DIAGNOSIS — N058 Unspecified nephritic syndrome with other morphologic changes: Secondary | ICD-10-CM

## 2015-05-16 DIAGNOSIS — N189 Chronic kidney disease, unspecified: Secondary | ICD-10-CM | POA: Diagnosis not present

## 2015-05-16 DIAGNOSIS — D631 Anemia in chronic kidney disease: Secondary | ICD-10-CM

## 2015-05-16 MED ORDER — DARBEPOETIN ALFA 100 MCG/0.5ML IJ SOSY
PREFILLED_SYRINGE | INTRAMUSCULAR | Status: AC
  Filled 2015-05-16: qty 0.5

## 2015-05-16 MED ORDER — DARBEPOETIN ALFA 100 MCG/0.5ML IJ SOSY
100.0000 ug | PREFILLED_SYRINGE | Freq: Once | INTRAMUSCULAR | Status: AC
  Administered 2015-05-16: 100 ug via SUBCUTANEOUS

## 2015-05-16 NOTE — Patient Instructions (Signed)
[place] at [facility] Discharge Instructions  RECOMMENDATIONS MADE BY THE CONSULTANT AND ANY TEST RESULTS WILL BE SENT TO YOUR REFERRING PHYSICIAN.  aranesp 100 mcg Follow up as scheduled Please call the clinic if you have any questions or concerns  Thank you for choosing [place] at [facility] to provide your oncology and hematology care.  To afford each patient quality time with our provider, please arrive at least 15 minutes before your scheduled appointment time.    You need to re-schedule your appointment should you arrive 10 or more minutes late.  We strive to give you quality time with our providers, and arriving late affects you and other patients whose appointments are after yours.  Also, if you no show three or more times for appointments you may be dismissed from the clinic at the providers discretion.     Again, thank you for choosing [facility].  Our hope is that these requests will decrease the amount of time that you wait before being seen by our physicians.       _____________________________________________________________  Should you have questions after your visit to [facility], please contact our office at [phone] between the hours of 8:30 a.m. and 4:30 p.m.  Voicemails left after 4:30 p.m. will not be returned until the following business day.  For prescription refill requests, have your pharmacy contact our office.

## 2015-05-16 NOTE — Progress Notes (Signed)
[name]'s reason for visit today is for an injection  as scheduled per MD orders.   [name] also received aranesp 100 mcg per MD orders; see [facility] for administration details.  [name] tolerated all procedures well and without incident; questions were answered and patient was discharged.

## 2015-05-22 ENCOUNTER — Encounter (HOSPITAL_COMMUNITY): Payer: Medicare Other

## 2015-05-23 ENCOUNTER — Encounter (HOSPITAL_COMMUNITY): Payer: Medicare Other

## 2015-05-23 ENCOUNTER — Other Ambulatory Visit (HOSPITAL_COMMUNITY): Payer: Self-pay

## 2015-05-24 ENCOUNTER — Encounter: Payer: Self-pay | Admitting: Cardiovascular Disease

## 2015-05-29 ENCOUNTER — Other Ambulatory Visit (HOSPITAL_COMMUNITY): Payer: Self-pay

## 2015-05-30 ENCOUNTER — Encounter (HOSPITAL_BASED_OUTPATIENT_CLINIC_OR_DEPARTMENT_OTHER): Payer: Medicare Other

## 2015-05-30 VITALS — BP 154/59 | HR 59 | Temp 98.0°F | Resp 18

## 2015-05-30 DIAGNOSIS — N189 Chronic kidney disease, unspecified: Secondary | ICD-10-CM

## 2015-05-30 DIAGNOSIS — E538 Deficiency of other specified B group vitamins: Secondary | ICD-10-CM | POA: Diagnosis not present

## 2015-05-30 DIAGNOSIS — N179 Acute kidney failure, unspecified: Secondary | ICD-10-CM | POA: Diagnosis not present

## 2015-05-30 DIAGNOSIS — N058 Unspecified nephritic syndrome with other morphologic changes: Secondary | ICD-10-CM

## 2015-05-30 DIAGNOSIS — D631 Anemia in chronic kidney disease: Secondary | ICD-10-CM | POA: Diagnosis not present

## 2015-05-30 LAB — CBC
HCT: 28.9 % — ABNORMAL LOW (ref 36.0–46.0)
Hemoglobin: 9.2 g/dL — ABNORMAL LOW (ref 12.0–15.0)
MCH: 28.5 pg (ref 26.0–34.0)
MCHC: 31.8 g/dL (ref 30.0–36.0)
MCV: 89.5 fL (ref 78.0–100.0)
PLATELETS: 240 10*3/uL (ref 150–400)
RBC: 3.23 MIL/uL — ABNORMAL LOW (ref 3.87–5.11)
RDW: 15.5 % (ref 11.5–15.5)
WBC: 7.1 10*3/uL (ref 4.0–10.5)

## 2015-05-30 MED ORDER — DARBEPOETIN ALFA 100 MCG/0.5ML IJ SOSY
100.0000 ug | PREFILLED_SYRINGE | Freq: Once | INTRAMUSCULAR | Status: AC
  Administered 2015-05-30: 100 ug via SUBCUTANEOUS

## 2015-05-30 MED ORDER — DARBEPOETIN ALFA 100 MCG/0.5ML IJ SOSY
PREFILLED_SYRINGE | INTRAMUSCULAR | Status: AC
  Filled 2015-05-30: qty 0.5

## 2015-05-30 NOTE — Patient Instructions (Addendum)
[name] at [facility] Discharge Instructions  RECOMMENDATIONS MADE BY THE CONSULTANT AND ANY TEST RESULTS WILL BE SENT TO YOUR REFERRING PHYSICIAN.  Hemoglobin 9.2 today. Aranesp 100 mcg injection given as ordered. Return as scheduled in 2 weeks for lab and injection.  Thank you for choosing [place] at [facility] to provide your oncology and hematology care.  To afford each patient quality time with our provider, please arrive at least 15 minutes before your scheduled appointment time.    You need to re-schedule your appointment should you arrive 10 or more minutes late.  We strive to give you quality time with our providers, and arriving late affects you and other patients whose appointments are after yours.  Also, if you no show three or more times for appointments you may be dismissed from the clinic at the providers discretion.     Again, thank you for choosing [facility].  Our hope is that these requests will decrease the amount of time that you wait before being seen by our physicians.       _____________________________________________________________  Should you have questions after your visit to [facility], please contact our office at [phone] between the hours of 8:30 a.m. and 4:30 p.m.  Voicemails left after 4:30 p.m. will not be returned until the following business day.  For prescription refill requests, have your pharmacy contact our office.

## 2015-05-30 NOTE — Progress Notes (Signed)
LABS DRAWN

## 2015-05-30 NOTE — Progress Notes (Signed)
[name] presents today for injection per MD orders. Aranesp 100 mcg administered SQ in right Abdomen. Administration without incident. Patient tolerated well.

## 2015-05-31 ENCOUNTER — Encounter: Payer: Self-pay | Admitting: Nutrition

## 2015-05-31 ENCOUNTER — Encounter: Payer: Medicare Other | Attending: Family Medicine | Admitting: Nutrition

## 2015-05-31 VITALS — Ht 65.0 in | Wt 210.7 lb

## 2015-05-31 DIAGNOSIS — Z6835 Body mass index (BMI) 35.0-35.9, adult: Secondary | ICD-10-CM | POA: Diagnosis not present

## 2015-05-31 DIAGNOSIS — Z794 Long term (current) use of insulin: Secondary | ICD-10-CM | POA: Insufficient documentation

## 2015-05-31 DIAGNOSIS — N184 Chronic kidney disease, stage 4 (severe): Secondary | ICD-10-CM | POA: Diagnosis not present

## 2015-05-31 DIAGNOSIS — E119 Type 2 diabetes mellitus without complications: Secondary | ICD-10-CM

## 2015-05-31 DIAGNOSIS — Z713 Dietary counseling and surveillance: Secondary | ICD-10-CM | POA: Insufficient documentation

## 2015-05-31 NOTE — Progress Notes (Signed)
  Medical Nutrition Therapy:  Appt start time: 1100 end time:  1130  Assessment:  Primary concerns today: DIabetes Follow up and CKD. Sees Kidney specialist in [place].  Last A1c 6.7%. FBS  131 mg/dl. Levemir 15 units and Humalog 5 units. Denies low blood sugars. Heel spurs is better. Gets SOB when walking. Is Anemic and gets iron treatments every 2 weeks. Sees kidney specialist in [place]. Trying to stick to a low potassium diet due to CKD. Wt up 2 lbs.Has been using "sea salt' some which would account for her fluid and weight gain. Re educated on using only Mrs. Dash. Diet is improving very nicely. Needs to work on increased physical activity. But her anemia and shortness of breath limits her. Willing to work on walking more in the house.  Rreferred Learning Style:   Auditory  Visual  Hands on  No preference indicated   Learning Readiness:   Ready  Change in progress  MEDICATIONS: See list   DIETARY INTAKE:  24-hr recall:  B ( AM): Oateal, boiled egg and applesauce, water Snk ( AM): none;   L ( PM): Chicken salad on wheat bread, apple, water Snk ( PM): none D ( PM): Homemade soup, (okra,tomatoes, beef, ), three crackers, grapes ,water Snk ( PM):  Pear Beverages: water  Usual physical activity: limited  Estimated energy needs: 1200 calories 135 g carbohydrates 90 g protein 33 g fat  Progress Towards Goal(s):  In progress.   Nutritional Diagnosis:  NB-1.1 Food and nutrition-related knowledge deficit As related to Diabetes.  As evidenced by A1C 7.3%.    Intervention:  Nutrition counseling and diabetes education on diet, exercise, medications and complications. Plan:  1. Walk 15 minutes a day. 2. Do not use any salt, especially Sea Salt at all. Only use Mrs. Dash and other herbs and spices to cook with 3. Increase low carb vegetables to 2 servings with lunch and dinner. 4. Lose 1lb per week. 5. Keep A1C 6.5% or less.  Teaching Method Utilized:   Visual Auditory Hands on  Handouts given during visit include: The Plate Method Iron rich foods Chronic Kidney Disease Nutrition Low potassium diet Low sodium diet  Barriers to learning/adherence to lifestyle change: none  Demonstrated degree of understanding via:  Teach Back   Monitoring/Evaluation:  Dietary intake, exercise, meal planning, SBG, and body weight in  3 month(s).

## 2015-05-31 NOTE — Patient Instructions (Signed)
Plan:  1. Walk 15 minutes a day. 2. Do not use any salt, especially Sea Salt at all. Only use Mrs. Dash and other herbs and spices to cook with 3. Increase low carb vegetables to 2 servings with lunch and dinner. 4. Lose 1lb per week. 5. Keep A1C 6.5% or less.

## 2015-06-05 ENCOUNTER — Ambulatory Visit: Payer: Medicare Other | Admitting: Cardiology

## 2015-06-06 ENCOUNTER — Other Ambulatory Visit (HOSPITAL_COMMUNITY): Payer: Self-pay

## 2015-06-06 ENCOUNTER — Encounter (HOSPITAL_COMMUNITY): Payer: Medicare Other

## 2015-06-07 ENCOUNTER — Encounter: Payer: Self-pay | Admitting: Cardiology

## 2015-06-07 ENCOUNTER — Ambulatory Visit (INDEPENDENT_AMBULATORY_CARE_PROVIDER_SITE_OTHER): Payer: Medicare Other | Admitting: Cardiology

## 2015-06-07 VITALS — BP 140/58 | HR 71 | Ht 65.0 in | Wt 209.0 lb

## 2015-06-07 DIAGNOSIS — I4891 Unspecified atrial fibrillation: Secondary | ICD-10-CM | POA: Diagnosis not present

## 2015-06-07 DIAGNOSIS — I38 Endocarditis, valve unspecified: Secondary | ICD-10-CM | POA: Diagnosis not present

## 2015-06-07 DIAGNOSIS — I4892 Unspecified atrial flutter: Secondary | ICD-10-CM

## 2015-06-07 NOTE — Patient Instructions (Signed)
Your physician wants you to follow-up in: 6 months with Dr.McDowell You will receive a reminder letter in the mail two months in advance. If you don't receive a letter, please call our office to schedule the follow-up appointment.     Your physician recommends that you continue on your current medications as directed. Please refer to the Current Medication list given to you today.     Thank you for choosing [facility] !        

## 2015-06-07 NOTE — Progress Notes (Signed)
Cardiology Office Note  Date: 06/07/2015   ID: [name], DOB 1946-07-21, MRN [id number]  PCP: [name], MD  Primary Cardiologist: Rozann Lesches, MD   Chief Complaint  Patient presents with  . Atrial Flutter    History of Present Illness: [name] is a 69 y.o. female last seen in March. She presents with her husband for a routine follow-up visit. Since last encounter she has had no significant palpitations, no chest pain. She reports compliance with her medications including Lopressor and Coumadin. No bleeding problems. Most recent INR was 2.1.  Follow-up ECG today confirms sinus rhythm.  She had an echocardiogram done in follow-up last year, reviewed below.   Past Medical History  Diagnosis Date  . Asthma   . Essential hypertension, benign   . Type 2 diabetes mellitus   . SLE (systemic lupus erythematosus)   . Anemia   . Atrial flutter     TEE/DCCV December 2013 - on Xarelto [facility])  . CKD (chronic kidney disease) stage 3, GFR 30-59 ml/min   . Bone spur of toe     Right 5th toe    Current Outpatient Prescriptions  Medication Sig Dispense Refill  . acetaminophen (TYLENOL) 500 MG tablet Take 1,000 mg by mouth every 6 (six) hours as needed. For pain    . albuterol (PROVENTIL HFA;VENTOLIN HFA) 108 (90 BASE) MCG/ACT inhaler Inhale 2 puffs into the lungs every 6 (six) hours as needed. For shortness of breath    . amLODipine (NORVASC) 10 MG tablet Take 1 tablet (10 mg total) by mouth at bedtime. 30 tablet 0  . atorvastatin (LIPITOR) 40 MG tablet Take 40 mg by mouth daily. 1/2 tablet 20 mg    . brimonidine (ALPHAGAN) 0.15 % ophthalmic solution Place 1 drop into the left eye 2 (two) times daily.    . calcitRIOL (ROCALTROL) 0.25 MCG capsule Take 0.25 mcg by mouth as directed. Take on Mondays Wednesdays and Fridays    . diclofenac (VOLTAREN) 0.1 % ophthalmic solution Place 1 drop into the left eye daily. Uses at lunch time    . folic acid  (FOLVITE) 1 MG tablet Take 1 mg by mouth daily.    . furosemide (LASIX) 80 MG tablet Take 80 mg by mouth as directed. Takes lasix 2 tablets am & 1 tablet pm    . HYDROcodone-acetaminophen (NORCO/VICODIN) 5-325 MG per tablet Take 1-2 tablets by mouth every 4 (four) hours as needed for moderate pain. 10 tablet 0  . hydroxychloroquine (PLAQUENIL) 200 MG tablet Take 200 mg by mouth 2 (two) times daily.    . insulin aspart (NOVOLOG) 100 UNIT/ML injection Inject into the skin 3 (three) times daily with meals. Sliding scale    . Insulin Detemir (LEVEMIR) 100 UNIT/ML Pen Inject 15 Units into the skin at bedtime.     . metoprolol (LOPRESSOR) 50 MG tablet Take 1 tablet (50 mg total) by mouth 2 (two) times daily. 60 tablet 6  . Multiple Vitamins-Minerals (CENTRUM SILVER PO) Take 1 tablet by mouth daily.    . predniSONE (DELTASONE) 20 MG tablet Take 10 mg by mouth as directed. Pt takes 10 mg daily    . travoprost, benzalkonium, (TRAVATAN) 0.004 % ophthalmic solution Place 1 drop into the left eye at bedtime.    . Vitamin D, Ergocalciferol, (DRISDOL) 50000 UNITS CAPS capsule Take 50,000 Units by mouth every 7 (seven) days. Tuesday.    . warfarin (COUMADIN) 5 MG tablet Take 1 1/2 tablets daily  except 2 tablets on Mondays (Patient taking differently: Take 1 1/2 tablets daily except 1 tablet on Monday and 1 on Thursday) 145 tablet 3   No current facility-administered medications for this visit.    Allergies:  Review of patient's allergies indicates no known allergies.   Social History: The patient  reports that she quit smoking about 2 years ago. Her smoking use included Cigarettes. She has a 30 pack-year smoking history. She has never used smokeless tobacco. She reports that she does not drink alcohol or use illicit drugs.   ROS:  Please see the history of present illness. Otherwise, complete review of systems is positive for none.  All other systems are reviewed and negative.   Physical Exam: VS:  BP 140/58  mmHg  Pulse 71  Ht 5\' 5"  (1.651 m)  Wt 209 lb (94.802 kg)  BMI 34.78 kg/m2, BMI Body mass index is 34.78 kg/(m^2).  Wt Readings from Last 3 Encounters:  06/07/15 209 lb (94.802 kg)  05/31/15 210 lb 11.2 oz (95.573 kg)  05/07/15 210 lb (95.255 kg)    Obese woman, appears comfortable at rest.  HEENT: Conjunctiva and lids normal, oropharynx clear.  Neck: Supple, no elevated JVP or carotid bruits, no thyromegaly.  Lungs: Clear to auscultation, nonlabored breathing at rest.  Cardiac: Regular rate and rhythm, no S3, 2/6 systolic murmur with soft diastolic murmur, no pericardial rub.  Abdomen: Soft, nontender, bowel sounds present, no guarding or rebound.  Extremities: 1+ edema, distal pulses 2+.    ECG: ECG is ordered today an shows sinus rhythm with nonspecific T-wave changes, borderline prolonged QT.  Recent Labwork: 05/07/2015: ALT 17; AST 19; BUN 64*; Creatinine, Ser 2.20*; Potassium 3.9; Sodium 143 05/30/2015: Hemoglobin 9.2*; Platelets 240   Other Studies Reviewed Today:  Echocardiogram from June 2015 showed mild to moderate LVH with LVEF 50-55%, restrictive diastolic filling pattern (grade 4), sclerotic aortic valve with moderate aortic regurgitation, mild mitral regurgitation, moderate left atrial enlargement, mildly to moderately dilated right ventricle, moderate to severe tricuspid regurgitation with PASP 61 mm mercury.  ASSESSMENT AND PLAN:  1. History of atrial flutter status post previous cardioversion, maintaining sinus rhythm on Lopressor and Coumadin for stroke prophylaxis.  2. Valvular heart disease including moderate aortic regurgitation and moderate to severe tricuspid regurgitation. Echocardiogram from last year noted above. No change on examination.  Current medicines were reviewed at length with the patient today.   Orders Placed This Encounter  Procedures  . EKG 12-Lead    Disposition: FU with me in 6 months.   Signed, Satira Sark, MD,  [facility] 06/07/2015 1:25 PM    [facility] at [facility] [address], [place], [place] [address] Phone: [phone]; Fax: [phone]

## 2015-06-11 DIAGNOSIS — E1151 Type 2 diabetes mellitus with diabetic peripheral angiopathy without gangrene: Secondary | ICD-10-CM | POA: Diagnosis not present

## 2015-06-11 DIAGNOSIS — L89892 Pressure ulcer of other site, stage 2: Secondary | ICD-10-CM | POA: Diagnosis not present

## 2015-06-11 DIAGNOSIS — E1140 Type 2 diabetes mellitus with diabetic neuropathy, unspecified: Secondary | ICD-10-CM | POA: Diagnosis not present

## 2015-06-13 ENCOUNTER — Encounter (HOSPITAL_COMMUNITY): Payer: Medicare Other

## 2015-06-13 ENCOUNTER — Encounter (HOSPITAL_COMMUNITY): Payer: Medicare Other | Attending: Hematology & Oncology

## 2015-06-13 ENCOUNTER — Encounter (HOSPITAL_COMMUNITY): Payer: Self-pay

## 2015-06-13 VITALS — BP 157/53 | HR 70 | Temp 98.2°F | Resp 20

## 2015-06-13 DIAGNOSIS — N058 Unspecified nephritic syndrome with other morphologic changes: Secondary | ICD-10-CM

## 2015-06-13 DIAGNOSIS — D631 Anemia in chronic kidney disease: Secondary | ICD-10-CM | POA: Diagnosis not present

## 2015-06-13 DIAGNOSIS — N189 Chronic kidney disease, unspecified: Secondary | ICD-10-CM | POA: Diagnosis not present

## 2015-06-13 DIAGNOSIS — E538 Deficiency of other specified B group vitamins: Secondary | ICD-10-CM | POA: Diagnosis not present

## 2015-06-13 DIAGNOSIS — N179 Acute kidney failure, unspecified: Secondary | ICD-10-CM | POA: Insufficient documentation

## 2015-06-13 LAB — CBC
HEMATOCRIT: 28.9 % — AB (ref 36.0–46.0)
HEMOGLOBIN: 9.4 g/dL — AB (ref 12.0–15.0)
MCH: 28.6 pg (ref 26.0–34.0)
MCHC: 32.5 g/dL (ref 30.0–36.0)
MCV: 87.8 fL (ref 78.0–100.0)
Platelets: 220 10*3/uL (ref 150–400)
RBC: 3.29 MIL/uL — AB (ref 3.87–5.11)
RDW: 15.3 % (ref 11.5–15.5)
WBC: 8.2 10*3/uL (ref 4.0–10.5)

## 2015-06-13 MED ORDER — DARBEPOETIN ALFA 100 MCG/0.5ML IJ SOSY
100.0000 ug | PREFILLED_SYRINGE | Freq: Once | INTRAMUSCULAR | Status: AC
  Administered 2015-06-13: 100 ug via SUBCUTANEOUS
  Filled 2015-06-13: qty 0.5

## 2015-06-13 NOTE — Progress Notes (Signed)
1110:  [name] presents today for injection per the provider's orders.  Aranesp administration without incident; see MAR for injection details.  Patient tolerated procedure well and without incident.  No questions or complaints noted at this time.

## 2015-06-13 NOTE — Patient Instructions (Signed)
[name] at [facility] Discharge Instructions  RECOMMENDATIONS MADE BY THE CONSULTANT AND ANY TEST RESULTS WILL BE SENT TO YOUR REFERRING PHYSICIAN.  [name] injection today (hemoglobin 9.4) Return as scheduled for lab work and possible [name] injections.  Thank you for choosing [place] at [facility] to provide your oncology and hematology care.  To afford each patient quality time with our provider, please arrive at least 15 minutes before your scheduled appointment time.    You need to re-schedule your appointment should you arrive 10 or more minutes late.  We strive to give you quality time with our providers, and arriving late affects you and other patients whose appointments are after yours.  Also, if you no show three or more times for appointments you may be dismissed from the clinic at the providers discretion.     Again, thank you for choosing [facility].  Our hope is that these requests will decrease the amount of time that you wait before being seen by our physicians.       _____________________________________________________________  Should you have questions after your visit to [facility], please contact our office at [phone] between the hours of 8:30 a.m. and 4:30 p.m.  Voicemails left after 4:30 p.m. will not be returned until the following business day.  For prescription refill requests, have your pharmacy contact our office.

## 2015-06-14 NOTE — Progress Notes (Signed)
Labs drawn

## 2015-06-15 ENCOUNTER — Encounter: Payer: Self-pay | Admitting: "Endocrinology

## 2015-06-19 ENCOUNTER — Other Ambulatory Visit (HOSPITAL_COMMUNITY): Payer: Self-pay

## 2015-06-19 ENCOUNTER — Encounter (HOSPITAL_COMMUNITY): Payer: Medicare Other

## 2015-06-22 DIAGNOSIS — E782 Mixed hyperlipidemia: Secondary | ICD-10-CM | POA: Diagnosis not present

## 2015-06-22 DIAGNOSIS — E6609 Other obesity due to excess calories: Secondary | ICD-10-CM | POA: Diagnosis not present

## 2015-06-22 DIAGNOSIS — E1122 Type 2 diabetes mellitus with diabetic chronic kidney disease: Secondary | ICD-10-CM | POA: Diagnosis not present

## 2015-06-22 DIAGNOSIS — I10 Essential (primary) hypertension: Secondary | ICD-10-CM | POA: Diagnosis not present

## 2015-06-25 ENCOUNTER — Ambulatory Visit (INDEPENDENT_AMBULATORY_CARE_PROVIDER_SITE_OTHER): Payer: Medicare Other | Admitting: *Deleted

## 2015-06-25 DIAGNOSIS — I4892 Unspecified atrial flutter: Secondary | ICD-10-CM

## 2015-06-25 DIAGNOSIS — Z5181 Encounter for therapeutic drug level monitoring: Secondary | ICD-10-CM

## 2015-06-25 LAB — POCT INR: INR: 1.9

## 2015-06-27 ENCOUNTER — Encounter (HOSPITAL_COMMUNITY): Payer: Medicare Other | Attending: Hematology & Oncology

## 2015-06-27 ENCOUNTER — Encounter (HOSPITAL_BASED_OUTPATIENT_CLINIC_OR_DEPARTMENT_OTHER): Payer: Medicare Other

## 2015-06-27 VITALS — BP 129/67 | HR 55 | Temp 98.6°F | Resp 18

## 2015-06-27 DIAGNOSIS — E538 Deficiency of other specified B group vitamins: Secondary | ICD-10-CM

## 2015-06-27 DIAGNOSIS — D631 Anemia in chronic kidney disease: Secondary | ICD-10-CM | POA: Insufficient documentation

## 2015-06-27 DIAGNOSIS — N189 Chronic kidney disease, unspecified: Secondary | ICD-10-CM

## 2015-06-27 DIAGNOSIS — N058 Unspecified nephritic syndrome with other morphologic changes: Secondary | ICD-10-CM | POA: Diagnosis not present

## 2015-06-27 LAB — CBC
HEMATOCRIT: 30 % — AB (ref 36.0–46.0)
Hemoglobin: 9.4 g/dL — ABNORMAL LOW (ref 12.0–15.0)
MCH: 27.3 pg (ref 26.0–34.0)
MCHC: 31.3 g/dL (ref 30.0–36.0)
MCV: 87.2 fL (ref 78.0–100.0)
PLATELETS: 215 10*3/uL (ref 150–400)
RBC: 3.44 MIL/uL — ABNORMAL LOW (ref 3.87–5.11)
RDW: 15.5 % (ref 11.5–15.5)
WBC: 8.2 10*3/uL (ref 4.0–10.5)

## 2015-06-27 LAB — FERRITIN: FERRITIN: 125 ng/mL (ref 11–307)

## 2015-06-27 MED ORDER — DARBEPOETIN ALFA 100 MCG/0.5ML IJ SOSY
PREFILLED_SYRINGE | INTRAMUSCULAR | Status: AC
  Filled 2015-06-27: qty 0.5

## 2015-06-27 MED ORDER — DARBEPOETIN ALFA 100 MCG/0.5ML IJ SOSY
100.0000 ug | PREFILLED_SYRINGE | Freq: Once | INTRAMUSCULAR | Status: AC
  Administered 2015-06-27: 100 ug via SUBCUTANEOUS

## 2015-06-27 NOTE — Progress Notes (Signed)
Labs drawn

## 2015-06-27 NOTE — Patient Instructions (Signed)
[place] at [facility] Discharge Instructions  RECOMMENDATIONS MADE BY THE CONSULTANT AND ANY TEST RESULTS WILL BE SENT TO YOUR REFERRING PHYSICIAN.  Hemoglobin 9.4 today. Aranesp 100 mcg injection given today as ordered. Return as scheduled for lab work and injection every 2 weeks.  Thank you for choosing [place] at [facility] to provide your oncology and hematology care.  To afford each patient quality time with our provider, please arrive at least 15 minutes before your scheduled appointment time.    You need to re-schedule your appointment should you arrive 10 or more minutes late.  We strive to give you quality time with our providers, and arriving late affects you and other patients whose appointments are after yours.  Also, if you no show three or more times for appointments you may be dismissed from the clinic at the providers discretion.     Again, thank you for choosing [facility].  Our hope is that these requests will decrease the amount of time that you wait before being seen by our physicians.       _____________________________________________________________  Should you have questions after your visit to [facility], please contact our office at [phone] between the hours of 8:30 a.m. and 4:30 p.m.  Voicemails left after 4:30 p.m. will not be returned until the following business day.  For prescription refill requests, have your pharmacy contact our office.

## 2015-06-27 NOTE — Progress Notes (Signed)
RAELINN GIAMPA presents today for injection per MD orders. Aranesp 100 mcg administered SQ in left Abdomen. Administration without incident. Patient tolerated well.

## 2015-06-28 DIAGNOSIS — E11321 Type 2 diabetes mellitus with mild nonproliferative diabetic retinopathy with macular edema: Secondary | ICD-10-CM | POA: Diagnosis not present

## 2015-06-29 ENCOUNTER — Ambulatory Visit (INDEPENDENT_AMBULATORY_CARE_PROVIDER_SITE_OTHER): Payer: Medicare Other | Admitting: "Endocrinology

## 2015-06-29 ENCOUNTER — Encounter: Payer: Self-pay | Admitting: "Endocrinology

## 2015-06-29 VITALS — BP 144/64 | HR 57 | Ht 65.0 in | Wt 211.0 lb

## 2015-06-29 DIAGNOSIS — E1122 Type 2 diabetes mellitus with diabetic chronic kidney disease: Secondary | ICD-10-CM | POA: Diagnosis not present

## 2015-06-29 DIAGNOSIS — I10 Essential (primary) hypertension: Secondary | ICD-10-CM

## 2015-06-29 DIAGNOSIS — Z794 Long term (current) use of insulin: Secondary | ICD-10-CM

## 2015-06-29 DIAGNOSIS — N184 Chronic kidney disease, stage 4 (severe): Secondary | ICD-10-CM | POA: Diagnosis not present

## 2015-06-29 DIAGNOSIS — E782 Mixed hyperlipidemia: Secondary | ICD-10-CM | POA: Insufficient documentation

## 2015-06-29 DIAGNOSIS — E785 Hyperlipidemia, unspecified: Secondary | ICD-10-CM

## 2015-06-29 NOTE — Progress Notes (Signed)
Subjective:    Patient ID: [name], female    DOB: [birth year],    Past Medical History  Diagnosis Date  . Asthma   . Essential hypertension, benign   . Type 2 diabetes mellitus   . SLE (systemic lupus erythematosus)   . Anemia   . Atrial flutter     TEE/DCCV December 2013 - on Xarelto [facility])  . CKD (chronic Little disease) stage 3, GFR 30-59 ml/min   . Bone spur of toe     Right 5th toe   Past Surgical History  Procedure Laterality Date  . Ankle surgery  1993  . Back surgery  1980  . Abdominal hysterectomy  1983  . Tee without cardioversion  09/16/2012    Procedure: TRANSESOPHAGEAL ECHOCARDIOGRAM (TEE);  Surgeon: Sanda Klein, MD;  Location: [place];  Service: Cardiovascular;  Laterality: N/A;  . Cardioversion  09/16/2012    Procedure: CARDIOVERSION;  Surgeon: Sanda Klein, MD;  Location: [facility] ENDOSCOPY;  Service: Cardiovascular;  Laterality: N/A;  . Esophagogastroduodenoscopy  09/20/2012    Procedure: ESOPHAGOGASTRODUODENOSCOPY (EGD);  Surgeon: Cleotis Nipper, MD;  Location: [facility] ENDOSCOPY;  Service: Endoscopy;  Laterality: Left;  . Colonoscopy  09/20/2012    Procedure: COLONOSCOPY;  Surgeon: Cleotis Nipper, MD;  Location: [facility] ENDOSCOPY;  Service: Endoscopy;  Laterality: Left;  . Av fistula placement Left 03/27/2014    Procedure: ARTERIOVENOUS (AV) FISTULA CREATION;  Surgeon: Rosetta Posner, MD;  Location: [facility] OR;  Service: Vascular;  Laterality: Left;  . Cardiovascular stress test  12/19/2009    no stress induced rhythm abnormalities, ekg negative for ischemia  . Transesophageal echocardiogram with cardioversion  09/16/2012    EF 60-65%, moderate LVH, moderate regurg of the aortic valve, LA moderately dilated   Social History   Social History  . Marital Status: Married    Spouse Name: N/A  . Number of Children: N/A  . Years of Education: N/A   Social History Main Topics  . Smoking status: Former Smoker -- 1.00 packs/day for 30 years    Types:  Cigarettes    Quit date: 08/29/2012  . Smokeless tobacco: Never Used     Comment: quit 08-2012  . Alcohol Use: No  . Drug Use: No  . Sexual Activity: Yes   Other Topics Concern  . None   Social History Narrative   Outpatient Encounter Prescriptions as of 06/29/2015  Medication Sig  . acetaminophen (TYLENOL) 500 MG tablet Take 1,000 mg by mouth every 6 (six) hours as needed. For pain  . albuterol (PROVENTIL HFA;VENTOLIN HFA) 108 (90 BASE) MCG/ACT inhaler Inhale 2 puffs into the lungs every 6 (six) hours as needed. For shortness of breath  . amLODipine (NORVASC) 10 MG tablet Take 1 tablet (10 mg total) by mouth at bedtime.  Marland Kitchen atorvastatin (LIPITOR) 40 MG tablet Take 40 mg by mouth daily. 1/2 tablet 20 mg  . brimonidine (ALPHAGAN) 0.15 % ophthalmic solution Place 1 drop into the left eye 2 (two) times daily.  . calcitRIOL (ROCALTROL) 0.25 MCG capsule Take 0.25 mcg by mouth as directed. Take on Mondays Wednesdays and Fridays  . diclofenac (VOLTAREN) 0.1 % ophthalmic solution Place 1 drop into the left eye daily. Uses at lunch time  . folic acid (FOLVITE) 1 MG tablet Take 1 mg by mouth daily.  . furosemide (LASIX) 80 MG tablet Take 80 mg by mouth as directed. Takes lasix 2 tablets am & 1 tablet pm  . HYDROcodone-acetaminophen (NORCO/VICODIN) 5-325 MG per tablet  Take 1-2 tablets by mouth every 4 (four) hours as needed for moderate pain.  . hydroxychloroquine (PLAQUENIL) 200 MG tablet Take 200 mg by mouth 2 (two) times daily.  . insulin aspart (NOVOLOG) 100 UNIT/ML injection Inject into the skin 3 (three) times daily with meals. Sliding scale  . Insulin Detemir (LEVEMIR) 100 UNIT/ML Pen Inject 15 Units into the skin at bedtime.   . metoprolol (LOPRESSOR) 50 MG tablet Take 1 tablet (50 mg total) by mouth 2 (two) times daily.  . Multiple Vitamins-Minerals (CENTRUM SILVER PO) Take 1 tablet by mouth daily.  . predniSONE (DELTASONE) 20 MG tablet Take 10 mg by mouth as directed. Pt takes 10 mg daily   . travoprost, benzalkonium, (TRAVATAN) 0.004 % ophthalmic solution Place 1 drop into the left eye at bedtime.  . Vitamin D, Ergocalciferol, (DRISDOL) 50000 UNITS CAPS capsule Take 50,000 Units by mouth every 7 (seven) days. Tuesday.  . warfarin (COUMADIN) 5 MG tablet Take 1 1/2 tablets daily except 2 tablets on Mondays (Patient taking differently: Take 1 1/2 tablets daily except 1 tablet on Thursdays)   No facility-administered encounter medications on file as of 06/29/2015.   ALLERGIES: Allergies  Allergen Reactions  . Ace Inhibitors    VACCINATION STATUS: Immunization History  Administered Date(s) Administered  . Influenza,inj,Quad PF,36+ Mos 07/03/2014    Diabetes She presents for her follow-up diabetic visit. She has type 2 diabetes mellitus. Onset time: diagnosed at approx age of 58. Pertinent negatives for hypoglycemia include no confusion, headaches, pallor or seizures. Associated symptoms include fatigue. Pertinent negatives for diabetes include no chest pain, no polydipsia, no polyphagia and no polyuria. Diabetic complications include nephropathy. Risk factors for coronary artery disease include dyslipidemia, diabetes mellitus and obesity. She is compliant with treatment most of the time. Her weight is stable. She has had a previous visit with a dietitian. She never participates in exercise. Her home blood glucose trend is decreasing steadily. Her overall blood glucose range is 130-140 mg/dl. An ACE inhibitor/angiotensin II receptor blocker is being taken.  Hypertension Pertinent negatives include no chest pain, headaches, palpitations or shortness of breath.  Hyperlipidemia Pertinent negatives include no chest pain, myalgias or shortness of breath. Current antihyperlipidemic treatment includes statins.     Review of Systems  Constitutional: Positive for fatigue. Negative for unexpected weight change.  HENT: Negative for trouble swallowing and voice change.   Eyes: Negative for  visual disturbance.  Respiratory: Negative for cough, shortness of breath and wheezing.   Cardiovascular: Negative for chest pain, palpitations and leg swelling.  Gastrointestinal: Negative for nausea, vomiting and diarrhea.  Endocrine: Negative for cold intolerance, heat intolerance, polydipsia, polyphagia and polyuria.  Musculoskeletal: Negative for myalgias and arthralgias.  Skin: Negative for color change, pallor, rash and wound.  Neurological: Negative for seizures and headaches.  Psychiatric/Behavioral: Negative for suicidal ideas and confusion.    Objective:    BP 144/64 mmHg  Pulse 57  Ht 5' 5" (1.651 m)  Wt 211 lb (95.709 kg)  BMI 35.11 kg/m2  SpO2 99%  Wt Readings from Last 3 Encounters:  06/29/15 211 lb (95.709 kg)  03/20/15 211 lb (95.709 kg)  06/07/15 209 lb (94.802 kg)    Physical Exam  Constitutional: She is oriented to person, place, and time. She appears well-developed.  HENT:  Head: Normocephalic and atraumatic.  Eyes: EOM are normal.  Neck: Normal range of motion. Neck supple. No tracheal deviation present. No thyromegaly present.  Cardiovascular: Normal rate and regular rhythm.   Pulmonary/Chest:  Effort normal and breath sounds normal.  Abdominal: Soft. Bowel sounds are normal. There is no tenderness. There is no guarding.  Musculoskeletal: Normal range of motion. She exhibits no edema.  Neurological: She is alert and oriented to person, place, and time. She has normal reflexes. No cranial nerve deficit. Coordination normal.  Skin: Skin is warm and dry. No rash noted. No erythema. No pallor.  Psychiatric: She has a normal mood and affect. Judgment normal.    Results for orders placed or performed in visit on 06/29/15  HM DIABETES EYE EXAM  Result Value Ref Range   HM Diabetic Eye Exam Retinopathy (A) No Retinopathy   Complete Blood Count (Most recent): Lab Results  Component Value Date   WBC 8.2 06/27/2015   HGB 9.4* 06/27/2015   HCT 30.0*  06/27/2015   MCV 87.2 06/27/2015   PLT 215 06/27/2015   Chemistry (most recent): Lab Results  Component Value Date   NA 143 05/07/2015   K 3.9 05/07/2015   CL 105 05/07/2015   CO2 28 05/07/2015   BUN 64* 05/07/2015   CREATININE 2.20* 05/07/2015   Diabetic Labs (most recent): Lab Results  Component Value Date   HGBA1C 6.7* 03/13/2015   HGBA1C 6.9* 03/13/2014   HGBA1C 6.1* 09/14/2012     Assessment & Plan:   1. Type 2 diabetes mellitus with stage 4 chronic Little disease Patient is advised to stick to a routine mealtimes to eat 3 meals  a day and avoid unnecessary snacks ( to snack only to correct hypoglycemia). Patient is advised to eliminate simple carbs  from their diet including cakes desserts ice cream soda (  diet and regular) , sweet tea , Candies,  chips and cookies, artificial sweeteners,   and "sugar-free" products .  This will help patient to have stable blood glucose profile and potentially lose weight. Patient is given detailed personalized glucose monitoring and insulin dosing instructions. Patient is instructed to call back with extremes of blood glucose less than 70 or greater than 300. Patient to bring meter and  blood glucose logs during their next visit.  I will continue Levemir 15 units qhs, Humalog 5 units TIDAC plus correction. I will request for repeat of the following labs before next visit. - CMP14+EGFR - Hemoglobin A1c  2. Essential hypertension, benign Controlled. Continue ARB.  3. Hyperlipemia Continue atorvastatatin.   4. Morbid obesity Carbs advise given. See above #1. 5. vitamin D deficiency.  -Continue current Vitamin D supplement. Will obtain : - Vit D  25 hydroxy (rtn osteoporosis monitoring)     Follow up plan: Return in about 3 months (around 09/28/2015) for diabetes, high blood pressure, high cholesterol, follow up with pre-visit labs, meter, and logs.  Glade Lloyd, MD Phone: [phone]  Fax: [phone]   06/29/2015, 10:12  PM

## 2015-07-02 ENCOUNTER — Encounter (HOSPITAL_COMMUNITY): Payer: Medicare Other

## 2015-07-02 ENCOUNTER — Other Ambulatory Visit (HOSPITAL_COMMUNITY): Payer: Self-pay

## 2015-07-02 ENCOUNTER — Ambulatory Visit (HOSPITAL_COMMUNITY): Payer: Self-pay | Admitting: Oncology

## 2015-07-03 ENCOUNTER — Other Ambulatory Visit: Payer: Self-pay | Admitting: Cardiology

## 2015-07-11 ENCOUNTER — Encounter (HOSPITAL_COMMUNITY): Payer: Medicare Other | Attending: Hematology & Oncology | Admitting: Oncology

## 2015-07-11 ENCOUNTER — Other Ambulatory Visit (HOSPITAL_COMMUNITY): Payer: Self-pay

## 2015-07-11 ENCOUNTER — Encounter (HOSPITAL_COMMUNITY): Payer: Medicare Other

## 2015-07-11 ENCOUNTER — Encounter (HOSPITAL_COMMUNITY): Payer: Self-pay | Admitting: Oncology

## 2015-07-11 ENCOUNTER — Encounter (HOSPITAL_COMMUNITY): Payer: Medicare Other | Attending: Hematology & Oncology

## 2015-07-11 VITALS — BP 145/51 | HR 57 | Temp 98.5°F | Resp 16 | Wt 213.0 lb

## 2015-07-11 DIAGNOSIS — N179 Acute kidney failure, unspecified: Secondary | ICD-10-CM | POA: Insufficient documentation

## 2015-07-11 DIAGNOSIS — D631 Anemia in chronic kidney disease: Secondary | ICD-10-CM | POA: Insufficient documentation

## 2015-07-11 DIAGNOSIS — R350 Frequency of micturition: Secondary | ICD-10-CM | POA: Diagnosis not present

## 2015-07-11 DIAGNOSIS — N189 Chronic kidney disease, unspecified: Secondary | ICD-10-CM | POA: Diagnosis not present

## 2015-07-11 DIAGNOSIS — N058 Unspecified nephritic syndrome with other morphologic changes: Secondary | ICD-10-CM

## 2015-07-11 DIAGNOSIS — N342 Other urethritis: Secondary | ICD-10-CM | POA: Diagnosis not present

## 2015-07-11 DIAGNOSIS — Z23 Encounter for immunization: Secondary | ICD-10-CM | POA: Diagnosis not present

## 2015-07-11 DIAGNOSIS — E538 Deficiency of other specified B group vitamins: Secondary | ICD-10-CM

## 2015-07-11 DIAGNOSIS — Z6836 Body mass index (BMI) 36.0-36.9, adult: Secondary | ICD-10-CM | POA: Diagnosis not present

## 2015-07-11 DIAGNOSIS — E6609 Other obesity due to excess calories: Secondary | ICD-10-CM | POA: Diagnosis not present

## 2015-07-11 DIAGNOSIS — Z1389 Encounter for screening for other disorder: Secondary | ICD-10-CM | POA: Diagnosis not present

## 2015-07-11 LAB — CBC
HCT: 31.3 % — ABNORMAL LOW (ref 36.0–46.0)
Hemoglobin: 9.7 g/dL — ABNORMAL LOW (ref 12.0–15.0)
MCH: 26.8 pg (ref 26.0–34.0)
MCHC: 31 g/dL (ref 30.0–36.0)
MCV: 86.5 fL (ref 78.0–100.0)
PLATELETS: 255 10*3/uL (ref 150–400)
RBC: 3.62 MIL/uL — ABNORMAL LOW (ref 3.87–5.11)
RDW: 15.3 % (ref 11.5–15.5)
WBC: 7.9 10*3/uL (ref 4.0–10.5)

## 2015-07-11 MED ORDER — DARBEPOETIN ALFA 100 MCG/0.5ML IJ SOSY
PREFILLED_SYRINGE | INTRAMUSCULAR | Status: AC
  Filled 2015-07-11: qty 0.5

## 2015-07-11 MED ORDER — DARBEPOETIN ALFA 100 MCG/0.5ML IJ SOSY
100.0000 ug | PREFILLED_SYRINGE | Freq: Once | INTRAMUSCULAR | Status: AC
  Administered 2015-07-11: 100 ug via SUBCUTANEOUS

## 2015-07-11 NOTE — Progress Notes (Signed)
See office visit encounter. 

## 2015-07-11 NOTE — Assessment & Plan Note (Signed)
Anemia of chronic renal disease with FSG in the setting of SLE (followed by Dr. Kandice Robinsons).  Labs today as ordered: CBC diff  Labs every 2 weeks: CBC diff  Labs every 4-6 weeks: Ferritin.  Hgb is noted to respond to ESA therapy.  I provided education regarding anemia from chronic renal disease.  She follows with Dr. Verner Chol.  She now sees Dr. Kandice Robinsons, Rheumatology.  She is currently on Plaquenil and Prednisone 10 mg daily.    Continue Aranesp 100 mcg every 2 weeks with labs as outlined above.  Supportive therapy plan reviewed.  Aranesp 100 mcg today as Hgb is 9.7 g/dL.  Return in 8-12 weeks for follow-up.

## 2015-07-11 NOTE — Progress Notes (Signed)
[name] presents today for injection per MD orders. Aranesp 100 mcg administered SQ in left Abdomen. Administration without incident. Patient tolerated well.

## 2015-07-11 NOTE — Progress Notes (Signed)
[name], [place] [id number]  Focal segmental glomerulosclerosis without nephrosis or chronic glomerulonephritis - Plan: ARANESP TREATMENT CONDITION, SCHEDULING COMMUNICATION INJECTION, SCHEDULING COMMUNICATION LAB, Darbepoetin Alfa (ARANESP) injection 100 mcg  Anemia in chronic renal disease - Plan: ARANESP TREATMENT CONDITION, SCHEDULING COMMUNICATION INJECTION, SCHEDULING COMMUNICATION LAB, Darbepoetin Alfa (ARANESP) injection 100 mcg  CURRENT THERAPY: IV iron as needed and H/O ESA therapy in the past, held in past due to financial contraints, last given on 04/06/2014.  Oncology Flowsheet 05/16/2015 05/30/2015 06/13/2015 06/27/2015  Darbepoetin Alfa (ARANESP) [place] 100 mcg 100 mcg 100 mcg 100 mcg    INTERVAL HISTORY: [name] 69 y.o. female returns for followup of anemia of chronic renal disease in the setting of SLE (followed by Dr. 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   I personally reviewed and went over laboratory results with the patient.  The results are noted within this dictation.  These are updated today.  Her Hgb is responding nicely to Aranesp intervention.    She was recently prescribed Amoxicillin for a UTI.  She is doing well and feeling well.  She denies any intolerance or issues associated with ESA therapy.  Past Medical History  Diagnosis Date  . Asthma   . Essential hypertension, benign   . Type 2 diabetes mellitus ([place])   . SLE (systemic lupus erythematosus) ([place])   . Anemia   . Atrial flutter [facility])     TEE/DCCV December 2013 - on Xarelto [facility])  . CKD (chronic kidney disease) stage 3, GFR 30-59 ml/min   . Bone spur of toe     Right 5th toe  . UTI (lower urinary tract infection) 07/11/15    has Atrial flutter ([place]); Morbid obesity ([place]); Type 2 diabetes mellitus with  diabetic neuropathy (HCC); SLE (systemic lupus erythematosus) ([place]); Essential hypertension, benign; COPD (chronic obstructive pulmonary disease) ([place]); Aortic regurgitation; Folate deficiency; Anemia in chronic renal disease; Renal failure (ARF), acute on chronic ([place]); Chronic diastolic heart failure ([place]); Encounter for therapeutic drug monitoring; Focal segmental glomerulosclerosis without nephrosis or chronic glomerulonephritis; Encounter for adequacy testing for hemodialysis ([place]); Chronic kidney disease, stage V ([place]); Hyperlipemia; and Type 2 diabetes mellitus with stage 4 chronic kidney disease ([place]) on her problem list.     is allergic to ace inhibitors.  Current Outpatient Prescriptions on File Prior to Visit  Medication Sig Dispense Refill  . acetaminophen (TYLENOL) 500 MG tablet Take 1,000 mg by mouth every 6 (six) hours as needed. For pain    . albuterol (PROVENTIL HFA;VENTOLIN HFA) 108 (90 BASE) MCG/ACT inhaler Inhale 2 puffs into the lungs every 6 (six) hours as needed. For shortness of breath    . amLODipine (NORVASC) 10 MG tablet Take 1 tablet (10 mg total) by mouth at bedtime. 30 tablet 0  . atorvastatin (LIPITOR) 40 MG tablet Take 40 mg by mouth daily. 1/2 tablet 20 mg    . brimonidine (ALPHAGAN) 0.15 % ophthalmic solution Place 1 drop into the left eye 2 (two) times daily.    . calcitRIOL (ROCALTROL) 0.25 MCG capsule Take 0.25 mcg by mouth as directed. Take on Mondays Wednesdays and Fridays    . diclofenac (VOLTAREN) 0.1 % ophthalmic solution Place 1 drop into the left eye daily. Uses at lunch time    . folic acid (FOLVITE) 1 MG tablet Take 1 mg by mouth daily.    Marland Kitchen  furosemide (LASIX) 80 MG tablet Take 80 mg by mouth as directed. Takes lasix 2 tablets am & 1 tablet pm    . HYDROcodone-acetaminophen (NORCO/VICODIN) 5-325 MG per tablet Take 1-2 tablets by mouth every 4 (four) hours as needed for moderate pain. 10 tablet 0  . hydroxychloroquine (PLAQUENIL) 200 MG tablet Take 200 mg  by mouth 2 (two) times daily.    . insulin aspart (NOVOLOG) 100 UNIT/ML injection Inject into the skin 3 (three) times daily with meals. Sliding scale    . Insulin Detemir (LEVEMIR) 100 UNIT/ML Pen Inject 15 Units into the skin at bedtime.     . metoprolol (LOPRESSOR) 50 MG tablet TAKE ONE TABLET BY MOUTH TWICE DAILY. 60 tablet 6  . Multiple Vitamins-Minerals (CENTRUM SILVER PO) Take 1 tablet by mouth daily.    . predniSONE (DELTASONE) 20 MG tablet Take 10 mg by mouth as directed. Pt takes 10 mg daily    . travoprost, benzalkonium, (TRAVATAN) 0.004 % ophthalmic solution Place 1 drop into the left eye at bedtime.    . Vitamin D, Ergocalciferol, (DRISDOL) 50000 UNITS CAPS capsule Take 50,000 Units by mouth every 7 (seven) days. Tuesday.    . warfarin (COUMADIN) 5 MG tablet Take 1 1/2 tablets daily except 2 tablets on Mondays (Patient taking differently: Take 1 1/2 tablets daily except 1 tablet on Thursdays) 145 tablet 3   No current facility-administered medications on file prior to visit.    Past Surgical History  Procedure Laterality Date  . Ankle surgery  1993  . Back surgery  1980  . Abdominal hysterectomy  1983  . Tee without cardioversion  09/16/2012    Procedure: TRANSESOPHAGEAL ECHOCARDIOGRAM (TEE);  Surgeon: Sanda Klein, MD;  Location: [place];  Service: Cardiovascular;  Laterality: N/A;  . Cardioversion  09/16/2012    Procedure: CARDIOVERSION;  Surgeon: Sanda Klein, MD;  Location: [facility] ENDOSCOPY;  Service: Cardiovascular;  Laterality: N/A;  . Esophagogastroduodenoscopy  09/20/2012    Procedure: ESOPHAGOGASTRODUODENOSCOPY (EGD);  Surgeon: Cleotis Nipper, MD;  Location: [facility] ENDOSCOPY;  Service: Endoscopy;  Laterality: Left;  . Colonoscopy  09/20/2012    Procedure: COLONOSCOPY;  Surgeon: Cleotis Nipper, MD;  Location: [facility] ENDOSCOPY;  Service: Endoscopy;  Laterality: Left;  . Av fistula placement Left 03/27/2014    Procedure: ARTERIOVENOUS (AV) FISTULA CREATION;  Surgeon:  Rosetta Posner, MD;  Location: [facility] OR;  Service: Vascular;  Laterality: Left;  . Cardiovascular stress test  12/19/2009    no stress induced rhythm abnormalities, ekg negative for ischemia  . Transesophageal echocardiogram with cardioversion  09/16/2012    EF 60-65%, moderate LVH, moderate regurg of the aortic valve, LA moderately dilated    Denies any headaches, dizziness, double vision, fevers, chills, night sweats, nausea, vomiting, diarrhea, constipation, chest pain, heart palpitations, shortness of breath, blood in stool, black tarry stool, urinary pain, urinary burning, urinary frequency, hematuria.   PHYSICAL EXAMINATION  ECOG PERFORMANCE STATUS: 1 - Symptomatic but completely ambulatory  Filed Vitals:   07/11/15 1342  BP: 145/51  Pulse: 57  Temp: 98.5 F (36.9 C)  Resp: 16    GENERAL:alert, no distress, comfortable, cooperative, obese, smiling and accompanied by husband SKIN: skin color, texture, turgor are normal, no rashes or significant lesions HEAD: Normocephalic, No masses, lesions, tenderness or abnormalities EYES: normal, PERRLA, EOMI, Conjunctiva are pink and non-injected EARS: External ears normal OROPHARYNX:lips, buccal mucosa, and tongue normal and mucous membranes are moist  NECK: supple, thyroid normal size, non-tender, without nodularity, no stridor, non-tender,  trachea midline LYMPH:  no palpable lymphadenopathy BREAST:not examined LUNGS: clear to auscultation  HEART: regular rate & rhythm ABDOMEN:abdomen soft, obese and normal bowel sounds BACK: Back symmetric, no curvature. EXTREMITIES:less then 2 second capillary refill, no joint deformities, effusion, or inflammation, no skin discoloration, no cyanosis  NEURO: alert & oriented x 3 with fluent speech, no focal motor/sensory deficits, gait normal   LABORATORY DATA: CBC    Component Value Date/Time   WBC 7.9 07/11/2015 1335   RBC 3.62* 07/11/2015 1335   RBC 2.63* 02/16/2014 1400   HGB 9.7* 07/11/2015  1335   HCT 31.3* 07/11/2015 1335   PLT 255 07/11/2015 1335   MCV 86.5 07/11/2015 1335   MCH 26.8 07/11/2015 1335   MCHC 31.0 07/11/2015 1335   RDW 15.3 07/11/2015 1335   LYMPHSABS 0.9 05/07/2015 1320   MONOABS 0.3 05/07/2015 1320   EOSABS 0.0 05/07/2015 1320   BASOSABS 0.0 05/07/2015 1320      Chemistry      Component Value Date/Time   NA 143 05/07/2015 1320   K 3.9 05/07/2015 1320   CL 105 05/07/2015 1320   CO2 28 05/07/2015 1320   BUN 64* 05/07/2015 1320   CREATININE 2.20* 05/07/2015 1320      Component Value Date/Time   CALCIUM 8.4* 05/07/2015 1320   CALCIUM 7.0* 03/16/2014 0835   ALKPHOS 47 05/07/2015 1320   AST 19 05/07/2015 1320   ALT 17 05/07/2015 1320   BILITOT 0.4 05/07/2015 1320      Lab Results  Component Value Date   FERRITIN 125 06/27/2015   Lab Results  Component Value Date   FOLATE 75.0 05/07/2015     PENDING LABS:   RADIOGRAPHIC STUDIES:  No results found.   PATHOLOGY:    ASSESSMENT AND PLAN:  Anemia in chronic renal disease Anemia of chronic renal disease with FSG in the setting of SLE (followed by Dr. Kandice Robinsons).  Labs today as ordered: CBC diff  Labs every 2 weeks: CBC diff  Labs every 4-6 weeks: Ferritin.  Hgb is noted to respond to ESA therapy.  I provided education regarding anemia from chronic renal disease.  She follows with Dr. Verner Chol.  She now sees Dr. Kandice Robinsons, Rheumatology.  She is currently on Plaquenil and Prednisone 10 mg daily.    Continue Aranesp 100 mcg every 2 weeks with labs as outlined above.  Supportive therapy plan reviewed.  Aranesp 100 mcg today as Hgb is 9.7 g/dL.  Return in 8-12 weeks for follow-up.    THERAPY PLAN:  Continue ESA therapy.  All questions were answered. The patient knows to call the clinic with any problems, questions or concerns. We can certainly see the patient much sooner if necessary.  Patient and plan discussed with Dr. Ancil Linsey and she is in agreement with the  aforementioned.   This note is electronically signed by: Doy Mince 07/11/2015 2:35 PM

## 2015-07-11 NOTE — Patient Instructions (Signed)
[place] at [facility] Discharge Instructions  RECOMMENDATIONS MADE BY THE CONSULTANT AND ANY TEST RESULTS WILL BE SENT TO YOUR REFERRING PHYSICIAN.  Exam and discussion by Robynn Pane, PA-C Aranesp today - your hemoglobin is 9.7 Report increased fatigue or shortness of breath.  Follow-up: Labs and aranesp every 2 weeks Office visit in 2 months.  Thank you for choosing [place] at [facility] to provide your oncology and hematology care.  To afford each patient quality time with our provider, please arrive at least 15 minutes before your scheduled appointment time.    You need to re-schedule your appointment should you arrive 10 or more minutes late.  We strive to give you quality time with our providers, and arriving late affects you and other patients whose appointments are after yours.  Also, if you no show three or more times for appointments you may be dismissed from the clinic at the providers discretion.     Again, thank you for choosing [facility].  Our hope is that these requests will decrease the amount of time that you wait before being seen by our physicians.       _____________________________________________________________  Should you have questions after your visit to [facility], please contact our office at [phone] between the hours of 8:30 a.m. and 4:30 p.m.  Voicemails left after 4:30 p.m. will not be returned until the following business day.  For prescription refill requests, have your pharmacy contact our office.

## 2015-07-16 ENCOUNTER — Ambulatory Visit (INDEPENDENT_AMBULATORY_CARE_PROVIDER_SITE_OTHER): Payer: Medicare Other | Admitting: *Deleted

## 2015-07-16 DIAGNOSIS — Z5181 Encounter for therapeutic drug level monitoring: Secondary | ICD-10-CM | POA: Diagnosis not present

## 2015-07-16 DIAGNOSIS — I4892 Unspecified atrial flutter: Secondary | ICD-10-CM

## 2015-07-16 LAB — POCT INR: INR: 3.9

## 2015-07-17 ENCOUNTER — Encounter (HOSPITAL_COMMUNITY): Payer: Medicare Other

## 2015-07-17 ENCOUNTER — Other Ambulatory Visit (HOSPITAL_COMMUNITY): Payer: Self-pay

## 2015-07-17 DIAGNOSIS — H401191 Primary open-angle glaucoma, unspecified eye, mild stage: Secondary | ICD-10-CM | POA: Diagnosis not present

## 2015-07-17 DIAGNOSIS — H401193 Primary open-angle glaucoma, unspecified eye, severe stage: Secondary | ICD-10-CM | POA: Diagnosis not present

## 2015-07-23 ENCOUNTER — Other Ambulatory Visit (HOSPITAL_COMMUNITY): Payer: Self-pay

## 2015-07-23 ENCOUNTER — Other Ambulatory Visit: Payer: Self-pay | Admitting: Cardiology

## 2015-07-23 ENCOUNTER — Other Ambulatory Visit: Payer: Self-pay | Admitting: "Endocrinology

## 2015-07-23 DIAGNOSIS — E1151 Type 2 diabetes mellitus with diabetic peripheral angiopathy without gangrene: Secondary | ICD-10-CM | POA: Diagnosis not present

## 2015-07-23 DIAGNOSIS — E1140 Type 2 diabetes mellitus with diabetic neuropathy, unspecified: Secondary | ICD-10-CM | POA: Diagnosis not present

## 2015-07-23 DIAGNOSIS — L89892 Pressure ulcer of other site, stage 2: Secondary | ICD-10-CM | POA: Diagnosis not present

## 2015-07-25 ENCOUNTER — Encounter (HOSPITAL_BASED_OUTPATIENT_CLINIC_OR_DEPARTMENT_OTHER): Payer: Medicare Other

## 2015-07-25 VITALS — BP 135/42 | HR 66 | Temp 97.9°F | Resp 18

## 2015-07-25 DIAGNOSIS — N189 Chronic kidney disease, unspecified: Secondary | ICD-10-CM

## 2015-07-25 DIAGNOSIS — D631 Anemia in chronic kidney disease: Secondary | ICD-10-CM | POA: Diagnosis not present

## 2015-07-25 DIAGNOSIS — E538 Deficiency of other specified B group vitamins: Secondary | ICD-10-CM

## 2015-07-25 DIAGNOSIS — N058 Unspecified nephritic syndrome with other morphologic changes: Secondary | ICD-10-CM

## 2015-07-25 DIAGNOSIS — N179 Acute kidney failure, unspecified: Secondary | ICD-10-CM | POA: Diagnosis not present

## 2015-07-25 LAB — CBC
HEMATOCRIT: 29.8 % — AB (ref 36.0–46.0)
Hemoglobin: 9.2 g/dL — ABNORMAL LOW (ref 12.0–15.0)
MCH: 26.9 pg (ref 26.0–34.0)
MCHC: 30.9 g/dL (ref 30.0–36.0)
MCV: 87.1 fL (ref 78.0–100.0)
Platelets: 213 10*3/uL (ref 150–400)
RBC: 3.42 MIL/uL — ABNORMAL LOW (ref 3.87–5.11)
RDW: 15.7 % — AB (ref 11.5–15.5)
WBC: 9.9 10*3/uL (ref 4.0–10.5)

## 2015-07-25 MED ORDER — DARBEPOETIN ALFA 100 MCG/0.5ML IJ SOSY
100.0000 ug | PREFILLED_SYRINGE | Freq: Once | INTRAMUSCULAR | Status: AC
  Administered 2015-07-25: 100 ug via SUBCUTANEOUS

## 2015-07-25 MED ORDER — DARBEPOETIN ALFA 100 MCG/0.5ML IJ SOSY
PREFILLED_SYRINGE | INTRAMUSCULAR | Status: AC
  Filled 2015-07-25: qty 0.5

## 2015-07-25 NOTE — Patient Instructions (Signed)
[place] at [facility] Discharge Instructions  RECOMMENDATIONS MADE BY THE CONSULTANT AND ANY TEST RESULTS WILL BE SENT TO YOUR REFERRING PHYSICIAN.  Hemoglobin 9.2 Aranesp 100 mcg injection given as ordered. Return as scheduled.  Thank you for choosing [place] at [facility] to provide your oncology and hematology care.  To afford each patient quality time with our provider, please arrive at least 15 minutes before your scheduled appointment time.    You need to re-schedule your appointment should you arrive 10 or more minutes late.  We strive to give you quality time with our providers, and arriving late affects you and other patients whose appointments are after yours.  Also, if you no show three or more times for appointments you may be dismissed from the clinic at the providers discretion.     Again, thank you for choosing [facility].  Our hope is that these requests will decrease the amount of time that you wait before being seen by our physicians.       _____________________________________________________________  Should you have questions after your visit to [facility], please contact our office at [phone] between the hours of 8:30 a.m. and 4:30 p.m.  Voicemails left after 4:30 p.m. will not be returned until the following business day.  For prescription refill requests, have your pharmacy contact our office.

## 2015-07-25 NOTE — Progress Notes (Signed)
[name]'s reason for visit today is for labs as scheduled per MD orders.  Venipuncture performed with a 23 gauge butterfly needle to R Antecubital.  [name] tolerated procedure well and without incident; questions were answered and patient was discharged.

## 2015-07-25 NOTE — Progress Notes (Signed)
[name] presents today for injection per MD orders. Aranesp 100 mcg administered SQ in right Abdomen. Administration without incident. Patient tolerated well.

## 2015-07-26 DIAGNOSIS — M329 Systemic lupus erythematosus, unspecified: Secondary | ICD-10-CM | POA: Diagnosis not present

## 2015-07-26 DIAGNOSIS — N179 Acute kidney failure, unspecified: Secondary | ICD-10-CM | POA: Diagnosis not present

## 2015-07-26 DIAGNOSIS — N2581 Secondary hyperparathyroidism of renal origin: Secondary | ICD-10-CM | POA: Diagnosis not present

## 2015-07-26 DIAGNOSIS — N184 Chronic kidney disease, stage 4 (severe): Secondary | ICD-10-CM | POA: Diagnosis not present

## 2015-07-26 DIAGNOSIS — D631 Anemia in chronic kidney disease: Secondary | ICD-10-CM | POA: Diagnosis not present

## 2015-07-30 ENCOUNTER — Ambulatory Visit (INDEPENDENT_AMBULATORY_CARE_PROVIDER_SITE_OTHER): Payer: Medicare Other | Admitting: *Deleted

## 2015-07-30 DIAGNOSIS — Z5181 Encounter for therapeutic drug level monitoring: Secondary | ICD-10-CM | POA: Diagnosis not present

## 2015-07-30 DIAGNOSIS — I4892 Unspecified atrial flutter: Secondary | ICD-10-CM

## 2015-07-30 LAB — POCT INR: INR: 4.6

## 2015-07-31 DIAGNOSIS — M329 Systemic lupus erythematosus, unspecified: Secondary | ICD-10-CM | POA: Diagnosis not present

## 2015-07-31 DIAGNOSIS — M150 Primary generalized (osteo)arthritis: Secondary | ICD-10-CM | POA: Diagnosis not present

## 2015-07-31 DIAGNOSIS — M810 Age-related osteoporosis without current pathological fracture: Secondary | ICD-10-CM | POA: Diagnosis not present

## 2015-07-31 DIAGNOSIS — Z7952 Long term (current) use of systemic steroids: Secondary | ICD-10-CM | POA: Diagnosis not present

## 2015-08-03 ENCOUNTER — Other Ambulatory Visit: Payer: Self-pay | Admitting: "Endocrinology

## 2015-08-07 ENCOUNTER — Other Ambulatory Visit (HOSPITAL_COMMUNITY): Payer: Medicare Other

## 2015-08-07 ENCOUNTER — Encounter (HOSPITAL_COMMUNITY): Payer: Medicare Other

## 2015-08-08 ENCOUNTER — Encounter: Payer: Self-pay | Admitting: "Endocrinology

## 2015-08-09 ENCOUNTER — Encounter (HOSPITAL_COMMUNITY): Payer: Medicare Other | Attending: Hematology & Oncology

## 2015-08-09 ENCOUNTER — Encounter (HOSPITAL_COMMUNITY): Payer: Medicare Other

## 2015-08-09 VITALS — BP 156/37 | HR 63 | Temp 98.1°F | Resp 18

## 2015-08-09 DIAGNOSIS — D631 Anemia in chronic kidney disease: Secondary | ICD-10-CM | POA: Insufficient documentation

## 2015-08-09 DIAGNOSIS — E538 Deficiency of other specified B group vitamins: Secondary | ICD-10-CM

## 2015-08-09 DIAGNOSIS — N189 Chronic kidney disease, unspecified: Secondary | ICD-10-CM | POA: Insufficient documentation

## 2015-08-09 DIAGNOSIS — N179 Acute kidney failure, unspecified: Secondary | ICD-10-CM | POA: Insufficient documentation

## 2015-08-09 DIAGNOSIS — N058 Unspecified nephritic syndrome with other morphologic changes: Secondary | ICD-10-CM

## 2015-08-09 LAB — CBC
HEMATOCRIT: 29.7 % — AB (ref 36.0–46.0)
Hemoglobin: 9.2 g/dL — ABNORMAL LOW (ref 12.0–15.0)
MCH: 27.1 pg (ref 26.0–34.0)
MCHC: 31 g/dL (ref 30.0–36.0)
MCV: 87.4 fL (ref 78.0–100.0)
Platelets: 219 10*3/uL (ref 150–400)
RBC: 3.4 MIL/uL — AB (ref 3.87–5.11)
RDW: 16 % — AB (ref 11.5–15.5)
WBC: 7 10*3/uL (ref 4.0–10.5)

## 2015-08-09 LAB — FERRITIN: Ferritin: 145 ng/mL (ref 11–307)

## 2015-08-09 MED ORDER — DARBEPOETIN ALFA 100 MCG/0.5ML IJ SOSY
PREFILLED_SYRINGE | INTRAMUSCULAR | Status: AC
  Filled 2015-08-09: qty 0.5

## 2015-08-09 MED ORDER — DARBEPOETIN ALFA 100 MCG/0.5ML IJ SOSY
100.0000 ug | PREFILLED_SYRINGE | Freq: Once | INTRAMUSCULAR | Status: AC
  Administered 2015-08-09: 100 ug via SUBCUTANEOUS

## 2015-08-09 NOTE — Progress Notes (Signed)
..  [name]'s reason for visit today is for labs as scheduled per MD orders.  Venipuncture performed with a 23 gauge butterfly needle to [name] tolerated procedure well and without incident; questions were answered and patient was discharged.

## 2015-08-09 NOTE — Progress Notes (Signed)
[name] presents today for injection per MD orders. Aranesp 135mcg administered SQ in right Abdomen. Administration without incident. Patient tolerated well.

## 2015-08-09 NOTE — Patient Instructions (Signed)
[place] at [facility] Discharge Instructions  RECOMMENDATIONS MADE BY THE CONSULTANT AND ANY TEST RESULTS WILL BE SENT TO YOUR REFERRING PHYSICIAN.  Aranesp today.  Please follow appointment schedule for follow up.    Thank you for choosing [place] at [facility] to provide your oncology and hematology care.  To afford each patient quality time with our provider, please arrive at least 15 minutes before your scheduled appointment time.    You need to re-schedule your appointment should you arrive 10 or more minutes late.  We strive to give you quality time with our providers, and arriving late affects you and other patients whose appointments are after yours.  Also, if you no show three or more times for appointments you may be dismissed from the clinic at the providers discretion.     Again, thank you for choosing [facility].  Our hope is that these requests will decrease the amount of time that you wait before being seen by our physicians.       _____________________________________________________________  Should you have questions after your visit to [facility], please contact our office at [phone] between the hours of 8:30 a.m. and 4:30 p.m.  Voicemails left after 4:30 p.m. will not be returned until the following business day.  For prescription refill requests, have your pharmacy contact our office.

## 2015-08-13 ENCOUNTER — Ambulatory Visit (INDEPENDENT_AMBULATORY_CARE_PROVIDER_SITE_OTHER): Payer: Medicare Other | Admitting: *Deleted

## 2015-08-13 DIAGNOSIS — Z5181 Encounter for therapeutic drug level monitoring: Secondary | ICD-10-CM

## 2015-08-13 DIAGNOSIS — I4892 Unspecified atrial flutter: Secondary | ICD-10-CM | POA: Diagnosis not present

## 2015-08-13 LAB — POCT INR: INR: 2.4

## 2015-08-16 DIAGNOSIS — E113213 Type 2 diabetes mellitus with mild nonproliferative diabetic retinopathy with macular edema, bilateral: Secondary | ICD-10-CM | POA: Diagnosis not present

## 2015-08-21 DIAGNOSIS — E1140 Type 2 diabetes mellitus with diabetic neuropathy, unspecified: Secondary | ICD-10-CM | POA: Diagnosis not present

## 2015-08-21 DIAGNOSIS — L89892 Pressure ulcer of other site, stage 2: Secondary | ICD-10-CM | POA: Diagnosis not present

## 2015-08-21 DIAGNOSIS — E1151 Type 2 diabetes mellitus with diabetic peripheral angiopathy without gangrene: Secondary | ICD-10-CM | POA: Diagnosis not present

## 2015-08-22 ENCOUNTER — Encounter (HOSPITAL_BASED_OUTPATIENT_CLINIC_OR_DEPARTMENT_OTHER): Payer: Medicare Other

## 2015-08-22 ENCOUNTER — Encounter (HOSPITAL_COMMUNITY): Payer: Medicare Other | Attending: Oncology

## 2015-08-22 ENCOUNTER — Encounter (HOSPITAL_COMMUNITY): Payer: Self-pay

## 2015-08-22 VITALS — BP 123/98 | HR 75 | Temp 97.9°F | Resp 20

## 2015-08-22 DIAGNOSIS — D631 Anemia in chronic kidney disease: Secondary | ICD-10-CM

## 2015-08-22 DIAGNOSIS — E538 Deficiency of other specified B group vitamins: Secondary | ICD-10-CM

## 2015-08-22 DIAGNOSIS — N189 Chronic kidney disease, unspecified: Secondary | ICD-10-CM

## 2015-08-22 DIAGNOSIS — N179 Acute kidney failure, unspecified: Secondary | ICD-10-CM | POA: Diagnosis not present

## 2015-08-22 DIAGNOSIS — N058 Unspecified nephritic syndrome with other morphologic changes: Secondary | ICD-10-CM

## 2015-08-22 LAB — CBC
HCT: 29.2 % — ABNORMAL LOW (ref 36.0–46.0)
Hemoglobin: 9 g/dL — ABNORMAL LOW (ref 12.0–15.0)
MCH: 26.2 pg (ref 26.0–34.0)
MCHC: 30.8 g/dL (ref 30.0–36.0)
MCV: 85.1 fL (ref 78.0–100.0)
PLATELETS: 259 10*3/uL (ref 150–400)
RBC: 3.43 MIL/uL — AB (ref 3.87–5.11)
RDW: 15.8 % — AB (ref 11.5–15.5)
WBC: 8.3 10*3/uL (ref 4.0–10.5)

## 2015-08-22 MED ORDER — DARBEPOETIN ALFA 100 MCG/0.5ML IJ SOSY
PREFILLED_SYRINGE | INTRAMUSCULAR | Status: AC
  Filled 2015-08-22: qty 0.5

## 2015-08-22 MED ORDER — DARBEPOETIN ALFA 100 MCG/0.5ML IJ SOSY
100.0000 ug | PREFILLED_SYRINGE | Freq: Once | INTRAMUSCULAR | Status: AC
  Administered 2015-08-22: 100 ug via SUBCUTANEOUS

## 2015-08-22 NOTE — Progress Notes (Signed)
[name] presents today for injection per MD orders. Aranesp 142mcg administered SQ in right Abdomen. Administration without incident. Patient tolerated well.

## 2015-08-22 NOTE — Patient Instructions (Signed)
[place] at [facility] Discharge Instructions  RECOMMENDATIONS MADE BY THE CONSULTANT AND ANY TEST RESULTS WILL BE SENT TO YOUR REFERRING PHYSICIAN.  Aranesp given today per orders Follow up per schedule   Thank you for choosing [place] at [facility] to provide your oncology and hematology care.  To afford each patient quality time with our provider, please arrive at least 15 minutes before your scheduled appointment time.    You need to re-schedule your appointment should you arrive 10 or more minutes late.  We strive to give you quality time with our providers, and arriving late affects you and other patients whose appointments are after yours.  Also, if you no show three or more times for appointments you may be dismissed from the clinic at the providers discretion.     Again, thank you for choosing [facility].  Our hope is that these requests will decrease the amount of time that you wait before being seen by our physicians.       _____________________________________________________________  Should you have questions after your visit to [facility], please contact our office at [phone] between the hours of 8:30 a.m. and 4:30 p.m.  Voicemails left after 4:30 p.m. will not be returned until the following business day.  For prescription refill requests, have your pharmacy contact our office.

## 2015-08-22 NOTE — Progress Notes (Signed)
LABS DRAWN

## 2015-09-04 ENCOUNTER — Encounter (HOSPITAL_COMMUNITY): Payer: Self-pay | Admitting: Oncology

## 2015-09-04 NOTE — Assessment & Plan Note (Addendum)
Anemia of chronic renal disease with FSG in the setting of SLE (followed by Dr. Kandice Robinsons).  Labs today as ordered: CBC diff  Additional labs added for today: RF, CRP, ESR, CCP antibody  Labs every 2 weeks: CBC   Labs every 6 weeks: Ferritin. (last done on 08/09/2015)  Based upon response over the past 3 months, will increase Aranesp to 125 mcg every 2 weeks.  Additionally, I have ordered stool cards x 3.  She complains of right jaw and neck pain x 1 week.  EKG is ordered today.  EKG is unimpressive.  I provided education regarding anemia from chronic renal disease.  She follows with Dr. Verner Chol.  She now sees Dr. Kandice Robinsons, Rheumatology.  She is currently on Plaquenil and Prednisone 10 mg daily.    Return in 8 weeks for follow-up.

## 2015-09-04 NOTE — Progress Notes (Signed)
Dana Kilts, MD [place] [address]  Anemia of chronic renal failure, stage 4 (severe) ([place]) - Plan: C-reactive protein, Sedimentation rate, CBC, Ferritin, Occult blood card to lab, stool, Occult blood card to lab, stool, Occult blood card to lab, stool  SLE (systemic lupus erythematosus) ([place]) - Plan: Rheumatoid factor, C-reactive protein, Sedimentation rate, Cyclic citrul peptide antibody, IgG  CURRENT THERAPY: Aranesp increased today to 125 mcg every 2 weeks after being on Aranesp 100 mcg beginning on 05/16/2015.  IV iron as needed.   INTERVAL HISTORY: [name] 69 y.o. female returns for followup of anemia of chronic renal disease in the setting of SLE (followed by Dr. Kandice Little) and kidney disease, FSG (Focal segmental glomerulosclerosis without nephrosis or chronic glomerulonephritis), with fistula in place in preparation for hemodialysis.   I personally reviewed and went over laboratory results with the patient.  The results are noted within this dictation.  These are updated today.  Her Hgb did demonstrate a response initially to therapy, but most recently, her Hgb has declined.  She notes fatigue and "dragging" throughout the day.  She denies any blood loss that she can identify.  She notes a right neck/jaw pain that has been ongoing x 2 weeks.  She reports that it comes and goes.  She describes it as an ache.  She notes that palpation increases the pain.  Nothing seems to help with the pain when it is present.  She denies any chest pain, shortness of breath, and diaphoresis.  She denies any decreased ROM.  She denies any pain with mastication.    Past Medical History  Diagnosis Date  . Asthma   . Essential hypertension, benign   . Type 2 diabetes mellitus ([place])   . SLE (systemic lupus erythematosus) ([place])   . Anemia   . Atrial flutter [facility])     TEE/DCCV December 2013 - on Xarelto [facility])  . CKD (chronic kidney disease) stage 3, GFR  30-59 ml/min   . Bone spur of toe     Right 5th toe  . UTI (lower urinary tract infection) 07/11/15  . Anemia of chronic renal failure, stage 4 (severe) ([place]) 02/23/2014    has Atrial flutter ([place]); Morbid obesity ([place]); Type 2 diabetes mellitus with diabetic neuropathy (HCC); SLE (systemic lupus erythematosus) ([place]); Essential hypertension, benign; COPD (chronic obstructive pulmonary disease) ([place]); Aortic regurgitation; Folate deficiency; Anemia of chronic renal failure, stage 4 (severe) ([place]); Renal failure (ARF), acute on chronic ([place]); Chronic diastolic heart failure ([place]); Encounter for therapeutic drug monitoring; Focal segmental glomerulosclerosis without nephrosis or chronic glomerulonephritis; Encounter for adequacy testing for hemodialysis ([place]); Chronic kidney disease, stage V ([place]); Hyperlipemia; and Type 2 diabetes mellitus with stage 4 chronic kidney disease ([place]) on her problem list.     is allergic to ace inhibitors.  Current Outpatient Prescriptions on File Prior to Visit  Medication Sig Dispense Refill  . acetaminophen (TYLENOL) 500 MG tablet Take 1,000 mg by mouth every 6 (six) hours as needed. For pain    . albuterol (PROVENTIL HFA;VENTOLIN HFA) 108 (90 BASE) MCG/ACT inhaler Inhale 2 puffs into the lungs every 6 (six) hours as needed. For shortness of breath    . amLODipine (NORVASC) 10 MG tablet Take 1 tablet (10 mg total) by mouth at bedtime. 30 tablet 0  . atorvastatin (LIPITOR) 40 MG tablet Take 40 mg by mouth daily. 1/2 tablet 20 mg    . calcitRIOL (ROCALTROL) 0.25 MCG  capsule Take 0.25 mcg by mouth as directed. Take on Mondays Wednesdays and Fridays    . diclofenac (VOLTAREN) 0.1 % ophthalmic solution Place 1 drop into the left eye daily. Uses at lunch time    . folic acid (FOLVITE) 1 MG tablet Take 1 mg by mouth daily.    . furosemide (LASIX) 80 MG tablet Take 80 mg by mouth as directed. Takes lasix 2 tablets am & 1 tablet pm except on Mondays and Thursdays she takes 2  tablets am and 2 tablets pm    . HYDROcodone-acetaminophen (NORCO/VICODIN) 5-325 MG per tablet Take 1-2 tablets by mouth every 4 (four) hours as needed for moderate pain. 10 tablet 0  . hydroxychloroquine (PLAQUENIL) 200 MG tablet Take 200 mg by mouth 2 (two) times daily.    . insulin aspart (NOVOLOG) 100 UNIT/ML injection Inject into the skin 3 (three) times daily with meals. Sliding scale    . Insulin Detemir (LEVEMIR) 100 UNIT/ML Pen Inject 15 Units into the skin at bedtime.     . metoprolol (LOPRESSOR) 50 MG tablet TAKE ONE TABLET BY MOUTH TWICE DAILY. 60 tablet 6  . Multiple Vitamins-Minerals (CENTRUM SILVER PO) Take 1 tablet by mouth daily.    . predniSONE (DELTASONE) 20 MG tablet Take 10 mg by mouth as directed. Pt takes 10 mg and 61m alternating days    . travoprost, benzalkonium, (TRAVATAN) 0.004 % ophthalmic solution Place 1 drop into the left eye at bedtime.    . Vitamin D, Ergocalciferol, (DRISDOL) 50000 UNITS CAPS capsule TAKE ONE CAPSULE BY MOUTH ONCE WEEKLY. 12 capsule 0  . warfarin (COUMADIN) 5 MG tablet Take 1 1/2 tablets daily except 1 tablet on Thursdays (Patient taking differently: Take 1 1/2 tablets daily except 1 tablet on Thursdays, Tuesday, and Saturday) 180 tablet 3   No current facility-administered medications on file prior to visit.    Past Surgical History  Procedure Laterality Date  . Ankle surgery  1993  . Back surgery  1980  . Abdominal hysterectomy  1983  . Tee without cardioversion  09/16/2012    Procedure: TRANSESOPHAGEAL ECHOCARDIOGRAM (TEE);  Surgeon: MSanda Klein MD;  [facility] [facility]  Service: Cardiovascular;  Laterality: N/A;  . Cardioversion  09/16/2012    Procedure: CARDIOVERSION;  Surgeon: MSanda Klein MD;  [facility];  Service: Cardiovascular;  Laterality: N/A;  . Esophagogastroduodenoscopy  09/20/2012    Procedure: ESOPHAGOGASTRODUODENOSCOPY (EGD);  Surgeon: RCleotis Nipper MD;  [facility];  Service:  Endoscopy;  Laterality: Left;  . Colonoscopy  09/20/2012    Procedure: COLONOSCOPY;  Surgeon: RCleotis Nipper MD;  [facility];  Service: Endoscopy;  Laterality: Left;  . Av fistula placement Left 03/27/2014    Procedure: ARTERIOVENOUS (AV) FISTULA CREATION;  Surgeon: TRosetta Posner MD;  [facility];  Service: Vascular;  Laterality: Left;  . Cardiovascular stress test  12/19/2009    no stress induced rhythm abnormalities, ekg negative for ischemia  . Transesophageal echocardiogram with cardioversion  09/16/2012    EF 60-65%, moderate LVH, moderate regurg of the aortic valve, LA moderately dilated    Denies any headaches, dizziness, double vision, fevers, chills, night sweats, nausea, vomiting, diarrhea, constipation, chest pain, heart palpitations, shortness of breath, blood in stool, black tarry stool, urinary pain, urinary burning, urinary frequency, hematuria.   PHYSICAL EXAMINATION  ECOG PERFORMANCE STATUS: 1 - Symptomatic but completely ambulatory  Filed Vitals:   09/05/15 1300 09/05/15 1350  BP: 160/44 137/57  Pulse: 73  Temp: 97.9 F (36.6 C)   Resp: 20     GENERAL:alert, no distress, comfortable, cooperative, obese, smiling and accompanied by husband SKIN: skin color, texture, turgor are normal, no rashes or significant lesions HEAD: Normocephalic, No masses, lesions, tenderness or abnormalities EYES: normal, PERRLA, EOMI, Conjunctiva are pink and non-injected EARS: External ears normal, R TM is pearly white without any erythema or pain, L TM is pearly white without any erythema or pain. OROPHARYNX:lips, buccal mucosa, and tongue normal and mucous membranes are moist  NECK: supple, thyroid normal size, non-tender, without nodularity, no stridor, non-tender, trachea midline LYMPH:  no palpable lymphadenopathy BREAST:not examined LUNGS: clear to auscultation  HEART: regular rate & rhythm ABDOMEN:abdomen soft, obese and normal bowel sounds BACK: Back  symmetric, no curvature. EXTREMITIES:less then 2 second capillary refill, no joint deformities, effusion, or inflammation, no skin discoloration, no cyanosis  NEURO: alert & oriented x 3 with fluent speech, no focal motor/sensory deficits, gait normal   LABORATORY DATA: CBC    Component Value Date/Time   WBC 7.7 09/05/2015 1325   RBC 3.34* 09/05/2015 1325   RBC 2.63* 02/16/2014 1400   HGB 8.7* 09/05/2015 1325   HCT 28.3* 09/05/2015 1325   PLT 264 09/05/2015 1325   MCV 84.7 09/05/2015 1325   MCH 26.0 09/05/2015 1325   MCHC 30.7 09/05/2015 1325   RDW 16.1* 09/05/2015 1325   LYMPHSABS 0.9 05/07/2015 1320   MONOABS 0.3 05/07/2015 1320   EOSABS 0.0 05/07/2015 1320   BASOSABS 0.0 05/07/2015 1320      Chemistry      Component Value Date/Time   NA 143 05/07/2015 1320   K 3.9 05/07/2015 1320   CL 105 05/07/2015 1320   CO2 28 05/07/2015 1320   BUN 64* 05/07/2015 1320   CREATININE 2.20* 05/07/2015 1320      Component Value Date/Time   CALCIUM 8.4* 05/07/2015 1320   CALCIUM 7.0* 03/16/2014 0835   ALKPHOS 47 05/07/2015 1320   AST 19 05/07/2015 1320   ALT 17 05/07/2015 1320   BILITOT 0.4 05/07/2015 1320      Lab Results  Component Value Date   FERRITIN 145 08/09/2015   Lab Results  Component Value Date   FOLATE 75.0 05/07/2015     PENDING LABS:   RADIOGRAPHIC STUDIES:  No results found.   PATHOLOGY:    ASSESSMENT AND PLAN:  Anemia of chronic renal failure, stage 4 (severe) (HCC) Anemia of chronic renal disease with FSG in the setting of SLE (followed by Dr. Kandice Little).  Labs today as ordered: CBC diff  Additional labs added for today: RF, CRP, ESR, CCP antibody  Labs every 2 weeks: CBC   Labs every 6 weeks: Ferritin. (last done on 08/09/2015)  Based upon response over the past 3 months, will increase Aranesp to 125 mcg every 2 weeks.  Additionally, I have ordered stool cards x 3.  She complains of right jaw and neck pain x 1 week.  EKG is ordered today.   EKG is unimpressive.  I provided education regarding anemia from chronic renal disease.  She follows with Dr. Verner Chol.  She now sees Dr. Kandice Little, Rheumatology.  She is currently on Plaquenil and Prednisone 10 mg daily.    Return in 8 weeks for follow-up.    THERAPY PLAN:  Continue ESA therapy and I will increase Aranesp to 125 mcg every 2 weeks.  All questions were answered. The patient knows to call the clinic with any problems, questions or concerns. We can certainly  see the patient much sooner if necessary.  Patient and plan discussed with Dr. Ancil Linsey and she is in agreement with the aforementioned.   This note is electronically signed by: Doy Mince 09/05/2015 5:16 PM

## 2015-09-05 ENCOUNTER — Encounter (HOSPITAL_COMMUNITY): Payer: Self-pay | Admitting: Oncology

## 2015-09-05 ENCOUNTER — Other Ambulatory Visit: Payer: Self-pay | Admitting: "Endocrinology

## 2015-09-05 ENCOUNTER — Other Ambulatory Visit (HOSPITAL_COMMUNITY): Payer: Self-pay | Admitting: Family Medicine

## 2015-09-05 ENCOUNTER — Encounter (HOSPITAL_COMMUNITY): Payer: Medicare Other | Attending: Hematology & Oncology | Admitting: Oncology

## 2015-09-05 ENCOUNTER — Encounter (HOSPITAL_COMMUNITY): Payer: Medicare Other | Attending: Oncology

## 2015-09-05 ENCOUNTER — Other Ambulatory Visit: Payer: Self-pay

## 2015-09-05 ENCOUNTER — Encounter (HOSPITAL_COMMUNITY): Payer: Medicare Other

## 2015-09-05 VITALS — BP 137/57 | HR 73 | Temp 97.9°F | Resp 20 | Wt 216.0 lb

## 2015-09-05 DIAGNOSIS — D631 Anemia in chronic kidney disease: Secondary | ICD-10-CM | POA: Insufficient documentation

## 2015-09-05 DIAGNOSIS — M329 Systemic lupus erythematosus, unspecified: Secondary | ICD-10-CM

## 2015-09-05 DIAGNOSIS — N189 Chronic kidney disease, unspecified: Principal | ICD-10-CM

## 2015-09-05 DIAGNOSIS — N058 Unspecified nephritic syndrome with other morphologic changes: Secondary | ICD-10-CM

## 2015-09-05 DIAGNOSIS — N184 Chronic kidney disease, stage 4 (severe): Secondary | ICD-10-CM | POA: Diagnosis not present

## 2015-09-05 DIAGNOSIS — Z1231 Encounter for screening mammogram for malignant neoplasm of breast: Secondary | ICD-10-CM

## 2015-09-05 DIAGNOSIS — E538 Deficiency of other specified B group vitamins: Secondary | ICD-10-CM

## 2015-09-05 DIAGNOSIS — N179 Acute kidney failure, unspecified: Secondary | ICD-10-CM | POA: Insufficient documentation

## 2015-09-05 LAB — CBC
HCT: 28.3 % — ABNORMAL LOW (ref 36.0–46.0)
Hemoglobin: 8.7 g/dL — ABNORMAL LOW (ref 12.0–15.0)
MCH: 26 pg (ref 26.0–34.0)
MCHC: 30.7 g/dL (ref 30.0–36.0)
MCV: 84.7 fL (ref 78.0–100.0)
PLATELETS: 264 10*3/uL (ref 150–400)
RBC: 3.34 MIL/uL — AB (ref 3.87–5.11)
RDW: 16.1 % — ABNORMAL HIGH (ref 11.5–15.5)
WBC: 7.7 10*3/uL (ref 4.0–10.5)

## 2015-09-05 LAB — C-REACTIVE PROTEIN: CRP: 1 mg/dL — AB (ref <1.0)

## 2015-09-05 LAB — SEDIMENTATION RATE: Sed Rate: 118 mm/hr — ABNORMAL HIGH (ref 0–22)

## 2015-09-05 MED ORDER — DARBEPOETIN ALFA 100 MCG/0.5ML IJ SOSY: 100.0000 ug | PREFILLED_SYRINGE | Freq: Once | INTRAMUSCULAR | Status: DC

## 2015-09-05 MED ORDER — DARBEPOETIN ALFA 150 MCG/0.3ML IJ SOSY
PREFILLED_SYRINGE | INTRAMUSCULAR | Status: AC
  Filled 2015-09-05: qty 0.3

## 2015-09-05 MED ORDER — DARBEPOETIN ALFA 150 MCG/0.3ML IJ SOSY
125.0000 ug | PREFILLED_SYRINGE | Freq: Once | INTRAMUSCULAR | Status: AC
  Administered 2015-09-05: 125 ug via SUBCUTANEOUS

## 2015-09-05 NOTE — Progress Notes (Signed)
See exam notes 

## 2015-09-05 NOTE — Progress Notes (Signed)
..  [name] presents today for injection per the provider's orders.  aranesp  administration without incident; see MAR for injection details.  Patient tolerated procedure well and without incident.  No questions or complaints noted at this time.

## 2015-09-05 NOTE — Patient Instructions (Addendum)
[place] at [facility] Discharge Instructions  RECOMMENDATIONS MADE BY THE CONSULTANT AND ANY TEST RESULTS WILL BE SENT TO YOUR REFERRING PHYSICIAN.    Exam completed by Kirby Crigler PA today We will get EKG today before you leave-- It was ok per Kirby Crigler PA Hemoglobin 8.7 We increased your aranesp today We will give you 3  stool cards to collect Aranesp every 2 weeks with labs prior Return to see the doctor in 8 weeks Please call the clinic if you have any questions or concerns      Thank you for choosing [place] at [facility] to provide your oncology and hematology care.  To afford each patient quality time with our provider, please arrive at least 15 minutes before your scheduled appointment time.    You need to re-schedule your appointment should you arrive 10 or more minutes late.  We strive to give you quality time with our providers, and arriving late affects you and other patients whose appointments are after yours.  Also, if you no show three or more times for appointments you may be dismissed from the clinic at the providers discretion.     Again, thank you for choosing [facility].  Our hope is that these requests will decrease the amount of time that you wait before being seen by our physicians.       _____________________________________________________________  Should you have questions after your visit to [facility], please contact our office at [phone] between the hours of 8:30 a.m. and 4:30 p.m.  Voicemails left after 4:30 p.m. will not be returned until the following business day.  For prescription refill requests, have your pharmacy contact our office.    Stool for Occult Blood Test WHY AM I HAVING THIS TEST? Stool for occult blood, or fecal occult blood test (FOBT), is a test that is used to screen for gastrointestinal (GI) bleeding, which may be an indicator of colon cancer.  This test can also detect small amounts of blood in your stool (feces) from other causes, such as ulcers or hemorrhoids. This test is usually done as part of an annual routine examination after age 73. WHAT KIND OF SAMPLE IS TAKEN? A sample of your stool is required for this test. Your health care provider may collect the sample with a swab of the rectum. Or, you may be instructed to collect the sample in a container at home. If you are instructed to collect the sample, your health care provider will provide you with the instructions and the supplies that you will need to do that. WILL I NEED TO COLLECT SAMPLES AT HOME?  A stool sample may need to be collected at home. When collecting a sample at home, make sure that you:  Use the sterile containers and other supplies that were given to you from the lab.  Do not mix urine, toilet paper, or water with your sample.  Label all slides and containers with your name and the date when you collected the sample. Your health care provider or lab staff will give you one or more test "cards." You will collect a separate sample from three different stools, usually on different days that follow each other. Follow these steps for each sample: 1. Collect a stool sample into a clean container. 2. With an applicator stick, apply a thin smear of stool sample onto each filter paper square or window that is on the card.  3. Allow the filter paper to dry. After it is dry, the sample will be stable. Usually, you will return all of the samples to your health care provider or lab at the same time.  HOW DO I PREPARE FOR THE TEST?  Do not eat any red meat within three days before testing.  Follow your health care provider's instructions about eating and drinking prior to the test. Your health care provider may instruct you to avoid other foods or substances.  Ask your health care provider about taking or not taking your medicines prior to the test. You may be instructed  to avoid certain medicines that are known to interfere with this test. HOW ARE THE TEST RESULTS REPORTED? Your test results will be reported as either positive or negative. It is your responsibility to obtain your test results. Ask the lab or department performing the test when and how you will get your results. WHAT DO THE RESULTS MEAN? A negative test result means that there is no occult blood within the stool. A negative result is normal. A positive test result may mean that there is blood in the stool. Causes of blood in the stool include:  GI tumors.  Certain GI diseases.  GI trauma or recent surgery.  Hemorrhoids. Talk with your health care provider to discuss your results, treatment options, and if necessary, the need for more tests. Talk with your health care provider if you have any questions about your results.   This information is not intended to replace advice given to you by your health care provider. Make sure you discuss any questions you have with your health care provider.   Document Released: 10/10/2004 Document Revised: 10/06/2014 Document Reviewed: 02/10/2014 Elsevier Interactive Patient Education [employer].

## 2015-09-06 DIAGNOSIS — M2669 Other specified disorders of temporomandibular joint: Secondary | ICD-10-CM | POA: Diagnosis not present

## 2015-09-06 DIAGNOSIS — E6609 Other obesity due to excess calories: Secondary | ICD-10-CM | POA: Diagnosis not present

## 2015-09-06 DIAGNOSIS — Z6836 Body mass index (BMI) 36.0-36.9, adult: Secondary | ICD-10-CM | POA: Diagnosis not present

## 2015-09-06 LAB — RHEUMATOID FACTOR: RHEUMATOID FACTOR: 12.9 [IU]/mL (ref 0.0–13.9)

## 2015-09-06 LAB — CYCLIC CITRUL PEPTIDE ANTIBODY, IGG/IGA: CCP ANTIBODIES IGG/IGA: 7 U (ref 0–19)

## 2015-09-09 DIAGNOSIS — N179 Acute kidney failure, unspecified: Secondary | ICD-10-CM | POA: Diagnosis not present

## 2015-09-09 DIAGNOSIS — N189 Chronic kidney disease, unspecified: Secondary | ICD-10-CM | POA: Diagnosis not present

## 2015-09-09 DIAGNOSIS — D631 Anemia in chronic kidney disease: Secondary | ICD-10-CM | POA: Diagnosis not present

## 2015-09-09 DIAGNOSIS — E538 Deficiency of other specified B group vitamins: Secondary | ICD-10-CM | POA: Diagnosis not present

## 2015-09-10 ENCOUNTER — Ambulatory Visit (INDEPENDENT_AMBULATORY_CARE_PROVIDER_SITE_OTHER): Payer: Medicare Other | Admitting: *Deleted

## 2015-09-10 ENCOUNTER — Encounter (HOSPITAL_COMMUNITY): Payer: Medicare Other

## 2015-09-10 ENCOUNTER — Ambulatory Visit (HOSPITAL_COMMUNITY)
Admission: RE | Admit: 2015-09-10 | Discharge: 2015-09-10 | Disposition: A | Discharge status: Home / Self Care | Payer: Medicare Other | Source: Ambulatory Visit | Attending: Family Medicine | Admitting: Family Medicine

## 2015-09-10 DIAGNOSIS — N179 Acute kidney failure, unspecified: Secondary | ICD-10-CM | POA: Diagnosis not present

## 2015-09-10 DIAGNOSIS — N184 Chronic kidney disease, stage 4 (severe): Principal | ICD-10-CM

## 2015-09-10 DIAGNOSIS — Z5181 Encounter for therapeutic drug level monitoring: Secondary | ICD-10-CM

## 2015-09-10 DIAGNOSIS — D631 Anemia in chronic kidney disease: Secondary | ICD-10-CM | POA: Diagnosis not present

## 2015-09-10 DIAGNOSIS — Z1231 Encounter for screening mammogram for malignant neoplasm of breast: Secondary | ICD-10-CM

## 2015-09-10 DIAGNOSIS — N189 Chronic kidney disease, unspecified: Secondary | ICD-10-CM | POA: Diagnosis not present

## 2015-09-10 DIAGNOSIS — I4892 Unspecified atrial flutter: Secondary | ICD-10-CM | POA: Diagnosis not present

## 2015-09-10 DIAGNOSIS — E538 Deficiency of other specified B group vitamins: Secondary | ICD-10-CM | POA: Diagnosis not present

## 2015-09-10 LAB — OCCULT BLOOD X 1 CARD TO LAB, STOOL
FECAL OCCULT BLD: NEGATIVE
FECAL OCCULT BLD: NEGATIVE
Fecal Occult Bld: POSITIVE — AB

## 2015-09-10 LAB — POCT INR: INR: 2.3

## 2015-09-10 NOTE — Progress Notes (Signed)
Occult cards dropped off x3

## 2015-09-12 ENCOUNTER — Encounter: Payer: Self-pay | Admitting: Internal Medicine

## 2015-09-12 ENCOUNTER — Other Ambulatory Visit: Payer: Self-pay | Admitting: Family Medicine

## 2015-09-12 DIAGNOSIS — R928 Other abnormal and inconclusive findings on diagnostic imaging of breast: Secondary | ICD-10-CM

## 2015-09-18 DIAGNOSIS — E1151 Type 2 diabetes mellitus with diabetic peripheral angiopathy without gangrene: Secondary | ICD-10-CM | POA: Diagnosis not present

## 2015-09-18 DIAGNOSIS — E1140 Type 2 diabetes mellitus with diabetic neuropathy, unspecified: Secondary | ICD-10-CM | POA: Diagnosis not present

## 2015-09-18 DIAGNOSIS — L89892 Pressure ulcer of other site, stage 2: Secondary | ICD-10-CM | POA: Diagnosis not present

## 2015-09-19 ENCOUNTER — Other Ambulatory Visit (HOSPITAL_COMMUNITY): Payer: Self-pay

## 2015-09-19 ENCOUNTER — Encounter (HOSPITAL_BASED_OUTPATIENT_CLINIC_OR_DEPARTMENT_OTHER): Payer: Medicare Other

## 2015-09-19 VITALS — BP 160/63 | HR 70 | Temp 98.1°F | Resp 18

## 2015-09-19 DIAGNOSIS — N184 Chronic kidney disease, stage 4 (severe): Secondary | ICD-10-CM

## 2015-09-19 DIAGNOSIS — E1122 Type 2 diabetes mellitus with diabetic chronic kidney disease: Secondary | ICD-10-CM | POA: Diagnosis not present

## 2015-09-19 DIAGNOSIS — N058 Unspecified nephritic syndrome with other morphologic changes: Secondary | ICD-10-CM

## 2015-09-19 DIAGNOSIS — D631 Anemia in chronic kidney disease: Secondary | ICD-10-CM | POA: Diagnosis not present

## 2015-09-19 DIAGNOSIS — N189 Chronic kidney disease, unspecified: Secondary | ICD-10-CM | POA: Diagnosis not present

## 2015-09-19 DIAGNOSIS — N179 Acute kidney failure, unspecified: Secondary | ICD-10-CM | POA: Diagnosis not present

## 2015-09-19 DIAGNOSIS — E785 Hyperlipidemia, unspecified: Secondary | ICD-10-CM | POA: Diagnosis not present

## 2015-09-19 DIAGNOSIS — E538 Deficiency of other specified B group vitamins: Secondary | ICD-10-CM | POA: Diagnosis not present

## 2015-09-19 LAB — CBC
HEMATOCRIT: 30.3 % — AB (ref 36.0–46.0)
HEMOGLOBIN: 9.2 g/dL — AB (ref 12.0–15.0)
MCH: 26.4 pg (ref 26.0–34.0)
MCHC: 30.4 g/dL (ref 30.0–36.0)
MCV: 86.8 fL (ref 78.0–100.0)
Platelets: 221 10*3/uL (ref 150–400)
RBC: 3.49 MIL/uL — ABNORMAL LOW (ref 3.87–5.11)
RDW: 16.3 % — AB (ref 11.5–15.5)
WBC: 8 10*3/uL (ref 4.0–10.5)

## 2015-09-19 MED ORDER — DARBEPOETIN ALFA 150 MCG/0.3ML IJ SOSY
125.0000 ug | PREFILLED_SYRINGE | Freq: Once | INTRAMUSCULAR | Status: AC
  Administered 2015-09-19: 125 ug via SUBCUTANEOUS

## 2015-09-19 MED ORDER — DARBEPOETIN ALFA 150 MCG/0.3ML IJ SOSY
PREFILLED_SYRINGE | INTRAMUSCULAR | Status: AC
  Filled 2015-09-19: qty 0.3

## 2015-09-19 NOTE — Progress Notes (Signed)
Tanysha F Brimage presents today for injection per MD orders. Aranesp 125 mcg administered SQ in right Abdomen. Administration without incident. Patient tolerated well.  

## 2015-09-19 NOTE — Patient Instructions (Signed)
[place] at [facility] Discharge Instructions  RECOMMENDATIONS MADE BY THE CONSULTANT AND ANY TEST RESULTS WILL BE SENT TO YOUR REFERRING PHYSICIAN.  Hemoglobin 9.2 today. Aranesp 125 mcg injection given as ordered. Return as scheduled.  Thank you for choosing [place] at [facility] to provide your oncology and hematology care.  To afford each patient quality time with our provider, please arrive at least 15 minutes before your scheduled appointment time.    You need to re-schedule your appointment should you arrive 10 or more minutes late.  We strive to give you quality time with our providers, and arriving late affects you and other patients whose appointments are after yours.  Also, if you no show three or more times for appointments you may be dismissed from the clinic at the providers discretion.     Again, thank you for choosing [facility].  Our hope is that these requests will decrease the amount of time that you wait before being seen by our physicians.       _____________________________________________________________  Should you have questions after your visit to [facility], please contact our office at [phone] between the hours of 8:30 a.m. and 4:30 p.m.  Voicemails left after 4:30 p.m. will not be returned until the following business day.  For prescription refill requests, have your pharmacy contact our office.

## 2015-09-19 NOTE — Progress Notes (Signed)
[name] presented for labwork. Labs per MD order drawn via Peripheral Line 23 gauge needle inserted in right AC  Good blood return present. Procedure without incident.  Needle removed intact. Patient tolerated procedure well.

## 2015-09-20 LAB — CMP14+EGFR
A/G RATIO: 0.9 — AB (ref 1.1–2.5)
ALK PHOS: 49 IU/L (ref 39–117)
ALT: 12 IU/L (ref 0–32)
AST: 19 IU/L (ref 0–40)
Albumin: 3.3 g/dL — ABNORMAL LOW (ref 3.6–4.8)
BILIRUBIN TOTAL: 0.3 mg/dL (ref 0.0–1.2)
BUN/Creatinine Ratio: 24 (ref 11–26)
BUN: 52 mg/dL — AB (ref 8–27)
CHLORIDE: 102 mmol/L (ref 96–106)
CO2: 24 mmol/L (ref 18–29)
Calcium: 8.5 mg/dL — ABNORMAL LOW (ref 8.7–10.3)
Creatinine, Ser: 2.16 mg/dL — ABNORMAL HIGH (ref 0.57–1.00)
GFR calc Af Amer: 26 mL/min/{1.73_m2} — ABNORMAL LOW (ref >59)
GFR calc non Af Amer: 23 mL/min/{1.73_m2} — ABNORMAL LOW (ref >59)
GLUCOSE: 142 mg/dL — AB (ref 65–99)
Globulin, Total: 3.5 g/dL (ref 1.5–4.5)
POTASSIUM: 3.8 mmol/L (ref 3.5–5.2)
Sodium: 143 mmol/L (ref 134–144)
TOTAL PROTEIN: 6.8 g/dL (ref 6.0–8.5)

## 2015-09-20 LAB — TSH: TSH: 2.36 u[IU]/mL (ref 0.450–4.500)

## 2015-09-20 LAB — VITAMIN D 25 HYDROXY (VIT D DEFICIENCY, FRACTURES): VIT D 25 HYDROXY: 46.2 ng/mL (ref 30.0–100.0)

## 2015-09-20 LAB — FERRITIN: FERRITIN: 112 ng/mL (ref 11–307)

## 2015-09-20 LAB — HGB A1C W/O EAG: Hgb A1c MFr Bld: 6.1 % — ABNORMAL HIGH (ref 4.8–5.6)

## 2015-09-20 LAB — T4, FREE: Free T4: 1.42 ng/dL (ref 0.82–1.77)

## 2015-09-25 ENCOUNTER — Ambulatory Visit (HOSPITAL_COMMUNITY)
Admission: RE | Admit: 2015-09-25 | Discharge: 2015-09-25 | Disposition: A | Discharge status: Home / Self Care | Payer: Medicare Other | Source: Ambulatory Visit | Attending: Family Medicine | Admitting: Family Medicine

## 2015-09-25 ENCOUNTER — Other Ambulatory Visit (HOSPITAL_COMMUNITY): Payer: Self-pay | Admitting: Family Medicine

## 2015-09-25 DIAGNOSIS — N6489 Other specified disorders of breast: Secondary | ICD-10-CM | POA: Diagnosis not present

## 2015-09-25 DIAGNOSIS — N63 Unspecified lump in breast: Secondary | ICD-10-CM | POA: Insufficient documentation

## 2015-09-25 DIAGNOSIS — N6320 Unspecified lump in the left breast, unspecified quadrant: Secondary | ICD-10-CM

## 2015-09-25 DIAGNOSIS — R928 Other abnormal and inconclusive findings on diagnostic imaging of breast: Secondary | ICD-10-CM | POA: Diagnosis not present

## 2015-10-01 ENCOUNTER — Ambulatory Visit: Payer: Medicare Other | Admitting: "Endocrinology

## 2015-10-02 ENCOUNTER — Ambulatory Visit (INDEPENDENT_AMBULATORY_CARE_PROVIDER_SITE_OTHER): Payer: Medicare Other | Admitting: "Endocrinology

## 2015-10-02 ENCOUNTER — Encounter: Payer: Self-pay | Admitting: "Endocrinology

## 2015-10-02 VITALS — BP 124/71 | HR 74 | Ht 65.0 in | Wt 215.0 lb

## 2015-10-02 DIAGNOSIS — E1122 Type 2 diabetes mellitus with diabetic chronic kidney disease: Secondary | ICD-10-CM | POA: Diagnosis not present

## 2015-10-02 DIAGNOSIS — N184 Chronic kidney disease, stage 4 (severe): Secondary | ICD-10-CM

## 2015-10-02 DIAGNOSIS — I10 Essential (primary) hypertension: Secondary | ICD-10-CM

## 2015-10-02 DIAGNOSIS — E785 Hyperlipidemia, unspecified: Secondary | ICD-10-CM | POA: Diagnosis not present

## 2015-10-02 DIAGNOSIS — Z794 Long term (current) use of insulin: Secondary | ICD-10-CM | POA: Diagnosis not present

## 2015-10-02 MED ORDER — INSULIN DETEMIR 100 UNIT/ML FLEXPEN: 12.0000 [IU] | PEN_INJECTOR | Freq: Every day | SUBCUTANEOUS | Status: DC

## 2015-10-02 NOTE — Patient Instructions (Signed)

## 2015-10-02 NOTE — Progress Notes (Signed)
Subjective:    Patient ID: [name], female    DOB: 05-29-1946,    Past Medical History  Diagnosis Date  . Asthma   . Essential hypertension, benign   . Type 2 diabetes mellitus ([place])   . SLE (systemic lupus erythematosus) ([place])   . Anemia   . Atrial flutter [facility])     TEE/DCCV December 2013 - on Xarelto [facility])  . CKD (chronic Little disease) stage 3, GFR 30-59 ml/min   . Bone spur of toe     Right 5th toe  . UTI (lower urinary tract infection) 07/11/15  . Anemia of chronic renal failure, stage 4 (severe) ([place]) 02/23/2014   Past Surgical History  Procedure Laterality Date  . Ankle surgery  1993  . Back surgery  1980  . Abdominal hysterectomy  1983  . Tee without cardioversion  09/16/2012    Procedure: TRANSESOPHAGEAL ECHOCARDIOGRAM (TEE);  Surgeon: Sanda Klein, MD;  Location: [place];  Service: Cardiovascular;  Laterality: N/A;  . Cardioversion  09/16/2012    Procedure: CARDIOVERSION;  Surgeon: Sanda Klein, MD;  [facility] END[facility]COPY;  Service: Cardiovascular;  Laterality: N/A;  . Esophagogastroduodenoscopy  09/20/2012    Procedure: ESOPHAGOGASTRODUODENOSCOPY (EGD);  Surgeon: Cleotis Nipper, MD;  Location: [facility] ENDOSCOPY;  Service: Endoscopy;  Laterality: Left;  . Colonoscopy  09/20/2012    Procedure: COLONOSCOPY;  Surgeon: Cleotis Nipper, MD;  Location: [facility] ENDOSCOPY;  Service: Endoscopy;  Laterality: Left;  . Av fistula placement Left 03/27/2014    Procedure: ARTERIOVENOUS (AV) FISTULA CREATION;  Surgeon: Rosetta Posner, MD;  Location: [facility] OR;  Service: Vascular;  Laterality: Left;  . Cardiovascular stress test  12/19/2009    no stress induced rhythm abnormalities, ekg negative for ischemia  . Transesophageal echocardiogram with cardioversion  09/16/2012    EF 60-65%, moderate LVH, moderate regurg of the aortic valve, LA moderately dilated   Social History   Social History  . Marital Status: Married    Spouse Name: N/A  . Number of Children: N/A   . Years of Education: N/A   Social History Main Topics  . Smoking status: Former Smoker -- 1.00 packs/day for 30 years    Types: Cigarettes    Quit date: 08/29/2012  . Smokeless tobacco: Never Used     Comment: quit 08-2012  . Alcohol Use: No  . Drug Use: No  . Sexual Activity: Yes   Other Topics Concern  . None   Social History Narrative   Outpatient Encounter Prescriptions as of 10/02/2015  Medication Sig  . acetaminophen (TYLENOL) 500 MG tablet Take 1,000 mg by mouth every 6 (six) hours as needed. For pain  . albuterol (ACCUNEB) 0.63 MG/3ML nebulizer solution 1 ampule.  Marland Kitchen albuterol (PROVENTIL HFA;VENTOLIN HFA) 108 (90 BASE) MCG/ACT inhaler Inhale 2 puffs into the lungs every 6 (six) hours as needed. For shortness of breath  . amLODipine (NORVASC) 10 MG tablet Take 1 tablet (10 mg total) by mouth at bedtime.  Marland Kitchen atorvastatin (LIPITOR) 40 MG tablet Take 40 mg by mouth daily. 1/2 tablet 20 mg  . brimonidine-timolol (COMBIGAN) 0.2-0.5 % ophthalmic solution 1 drop.  . calcitRIOL (ROCALTROL) 0.25 MCG capsule Take 0.25 mcg by mouth as directed. Take on Mondays Wednesdays and Fridays  . diclofenac (VOLTAREN) 0.1 % ophthalmic solution Place 1 drop into the left eye daily. Uses at lunch time  . folic acid (FOLVITE) 1 MG tablet Take 1 mg by mouth daily.  . furosemide (LASIX) 80 MG tablet  Take 80 mg by mouth as directed. Takes lasix 2 tablets am & 1 tablet pm except on Mondays and Thursdays she takes 2 tablets am and 2 tablets pm  . HYDROcodone-acetaminophen (NORCO/VICODIN) 5-325 MG per tablet Take 1-2 tablets by mouth every 4 (four) hours as needed for moderate pain.  . hydroxychloroquine (PLAQUENIL) 200 MG tablet Take 200 mg by mouth 2 (two) times daily.  . insulin aspart (NOVOLOG) 100 UNIT/ML injection Inject 5-11 Units into the skin 3 (three) times daily with meals.  . Insulin Detemir (LEVEMIR) 100 UNIT/ML Pen Inject 12 Units into the skin at bedtime.  . metoprolol (LOPRESSOR) 50 MG tablet  TAKE ONE TABLET BY MOUTH TWICE DAILY.  . Multiple Vitamins-Minerals (CENTRUM SILVER PO) Take 1 tablet by mouth daily.  Glory Rosebush DELICA LANCETS 99991111 MISC USE TO TEST 4 TIMES DAILY.  Marland Kitchen predniSONE (DELTASONE) 20 MG tablet Take 10 mg by mouth as directed. Pt takes 10 mg and 5mg  alternating days  . travoprost, benzalkonium, (TRAVATAN) 0.004 % ophthalmic solution Place 1 drop into the left eye at bedtime.  . Vitamin D, Ergocalciferol, (DRISDOL) 50000 UNITS CAPS capsule TAKE ONE CAPSULE BY MOUTH ONCE WEEKLY.  . warfarin (COUMADIN) 5 MG tablet Take 1 1/2 tablets daily except 1 tablet on Thursdays (Patient taking differently: Take 1 1/2 tablets daily except 1 tablet on Thursdays, Tuesday, and Saturday)  . [DISCONTINUED] Insulin Detemir (LEVEMIR) 100 UNIT/ML Pen Inject 12 Units into the skin at bedtime.   No facility-administered encounter medications on file as of 10/02/2015.   ALLERGIES: Allergies  Allergen Reactions  . Ace Inhibitors    VACCINATION STATUS: Immunization History  Administered Date(s) Administered  . Influenza,inj,Quad PF,36+ Mos 07/03/2014  . Influenza-Unspecified 07/11/2015    Diabetes She presents for her follow-up diabetic visit. She has type 2 diabetes mellitus. Onset time: diagnosed at approx age of 72. Her disease course has been improving. Pertinent negatives for hypoglycemia include no confusion, headaches, pallor or seizures. Associated symptoms include fatigue. Pertinent negatives for diabetes include no chest pain, no polydipsia, no polyphagia and no polyuria. Symptoms are improving. Diabetic complications include nephropathy. Risk factors for coronary artery disease include dyslipidemia, diabetes mellitus and obesity. She is compliant with treatment most of the time. Her weight is stable. She is following a generally unhealthy diet. She has had a previous visit with a dietitian. She never participates in exercise. Her home blood glucose trend is decreasing steadily (Her A1c  has improved to 6.1%, no hypoglycemia. Her EAG is between 100- 150.). Her breakfast blood glucose range is generally 140-180 mg/dl. Her lunch blood glucose range is generally 140-180 mg/dl. Her dinner blood glucose range is generally 140-180 mg/dl. Her overall blood glucose range is 140-180 mg/dl. An ACE inhibitor/angiotensin II receptor blocker is being taken.  Hypertension Pertinent negatives include no chest pain, headaches, palpitations or shortness of breath.  Hyperlipidemia Pertinent negatives include no chest pain, myalgias or shortness of breath. Current antihyperlipidemic treatment includes statins.     Review of Systems  Constitutional: Positive for fatigue. Negative for unexpected weight change.  HENT: Negative for trouble swallowing and voice change.   Eyes: Negative for visual disturbance.  Respiratory: Negative for cough, shortness of breath and wheezing.   Cardiovascular: Negative for chest pain, palpitations and leg swelling.  Gastrointestinal: Negative for nausea, vomiting and diarrhea.  Endocrine: Negative for cold intolerance, heat intolerance, polydipsia, polyphagia and polyuria.  Musculoskeletal: Negative for myalgias and arthralgias.  Skin: Negative for color change, pallor, rash and wound.  Neurological: Negative for seizures and headaches.  Psychiatric/Behavioral: Negative for suicidal ideas and confusion.    Objective:    BP 124/71 mmHg  Pulse 74  Ht 5\' 5"  (1.651 m)  Wt 215 lb (97.523 kg)  BMI 35.78 kg/m2  SpO2 100%  Wt Readings from Last 3 Encounters:  10/02/15 215 lb (97.523 kg)  09/05/15 216 lb (97.977 kg)  07/11/15 213 lb (96.616 kg)    Physical Exam  Constitutional: She is oriented to person, place, and time. She appears well-developed.  HENT:  Head: Normocephalic and atraumatic.  Eyes: EOM are normal.  Neck: Normal range of motion. Neck supple. No tracheal deviation present. No thyromegaly present.  Cardiovascular: Normal rate and regular  rhythm.   Pulmonary/Chest: Effort normal and breath sounds normal.  Abdominal: Soft. Bowel sounds are normal. There is no tenderness. There is no guarding.  Musculoskeletal: Normal range of motion. She exhibits no edema.  Neurological: She is alert and oriented to person, place, and time. She has normal reflexes. No cranial nerve deficit. Coordination normal.  Skin: Skin is warm and dry. No rash noted. No erythema. No pallor.  Psychiatric: She has a normal mood and affect. Judgment normal.    Chemistry (most recent): Lab Results  Component Value Date   NA 143 09/19/2015   K 3.8 09/19/2015   CL 102 09/19/2015   CO2 24 09/19/2015   BUN 52* 09/19/2015   CREATININE 2.16* 09/19/2015   Diabetic Labs (most recent): Lab Results  Component Value Date   HGBA1C 6.1* 09/19/2015   HGBA1C 6.7* 03/13/2015   HGBA1C 6.9* 03/13/2014     Assessment & Plan:   1. Type 2 diabetes mellitus with stage 4 chronic Little disease Patient is advised to stick to a routine mealtimes to eat 3 meals  a day and avoid unnecessary snacks ( to snack only to correct hypoglycemia). Patient is advised to eliminate simple carbs  from their diet including cakes desserts ice cream soda (  diet and regular) , sweet tea , Candies,  chips and cookies, artificial sweeteners,   and "sugar-free" products .  This will help patient to have stable blood glucose profile and potentially lose weight. Patient is given detailed personalized glucose monitoring and insulin dosing instructions. Patient is instructed to call back with extremes of blood glucose less than 70 or greater than 300. Patient to bring meter and  blood glucose logs during their next visit.  I will lower Levemir  To 12 units qhs, continue Humalog 5 units TIDAC plus correction. I will request for repeat of the following labs before next visit.   2. Essential hypertension, benign Controlled. Continue ARB.  3. Hyperlipemia Continue atorvastatatin.   4. Morbid  obesity Carbs advise given. See above #1. 5. vitamin D deficiency.  -Continue current Vitamin D supplement. Will obtain : - Vit D  25 hydroxy (rtn osteoporosis monitoring)   Follow up plan: Return in about 3 months (around 12/31/2015) for diabetes, high blood pressure, high cholesterol, follow up with pre-visit labs, meter, and logs.  Glade Lloyd, MD Phone: [phone]  Fax: [phone]   10/02/2015, 8:12 PM

## 2015-10-03 ENCOUNTER — Encounter (HOSPITAL_COMMUNITY): Payer: Medicare Other | Attending: Hematology & Oncology

## 2015-10-03 VITALS — BP 156/64 | HR 74 | Temp 98.3°F | Resp 20

## 2015-10-03 DIAGNOSIS — D631 Anemia in chronic kidney disease: Secondary | ICD-10-CM | POA: Diagnosis not present

## 2015-10-03 DIAGNOSIS — N189 Chronic kidney disease, unspecified: Secondary | ICD-10-CM

## 2015-10-03 DIAGNOSIS — E538 Deficiency of other specified B group vitamins: Secondary | ICD-10-CM | POA: Insufficient documentation

## 2015-10-03 DIAGNOSIS — N179 Acute kidney failure, unspecified: Secondary | ICD-10-CM | POA: Diagnosis not present

## 2015-10-03 DIAGNOSIS — N184 Chronic kidney disease, stage 4 (severe): Secondary | ICD-10-CM

## 2015-10-03 DIAGNOSIS — N058 Unspecified nephritic syndrome with other morphologic changes: Secondary | ICD-10-CM

## 2015-10-03 LAB — CBC
HEMATOCRIT: 28.9 % — AB (ref 36.0–46.0)
HEMOGLOBIN: 8.8 g/dL — AB (ref 12.0–15.0)
MCH: 26.1 pg (ref 26.0–34.0)
MCHC: 30.4 g/dL (ref 30.0–36.0)
MCV: 85.8 fL (ref 78.0–100.0)
Platelets: 271 10*3/uL (ref 150–400)
RBC: 3.37 MIL/uL — ABNORMAL LOW (ref 3.87–5.11)
RDW: 16 % — ABNORMAL HIGH (ref 11.5–15.5)
WBC: 8 10*3/uL (ref 4.0–10.5)

## 2015-10-03 MED ORDER — DARBEPOETIN ALFA 150 MCG/0.3ML IJ SOSY
PREFILLED_SYRINGE | INTRAMUSCULAR | Status: AC
  Filled 2015-10-03: qty 0.3

## 2015-10-03 MED ORDER — DARBEPOETIN ALFA 150 MCG/0.3ML IJ SOSY
125.0000 ug | PREFILLED_SYRINGE | Freq: Once | INTRAMUSCULAR | Status: AC
  Administered 2015-10-03: 125 ug via SUBCUTANEOUS

## 2015-10-03 NOTE — Progress Notes (Signed)
[name] presented for labwork. Labs per MD order drawn via Peripheral Line 23 gauge needle inserted in right antecubital.  Good blood return present. Procedure without incident.  Needle removed intact. Patient tolerated procedure well.  Labs reviewed with Dr.Penland and aranesp treatment history. Per her instructions will continue current dose and frequency x 4-6 more weeks, in no improvements will readdress issue then.  [name] presents today for injection per MD orders. Aranesp 125 mcg administered SQ in right Abdomen. Administration without incident. Patient tolerated well.

## 2015-10-03 NOTE — Patient Instructions (Signed)
[place] at [facility] Discharge Instructions  RECOMMENDATIONS MADE BY THE CONSULTANT AND ANY TEST RESULTS WILL BE SENT TO YOUR REFERRING PHYSICIAN.  Hemoglobin 8.8 today. Discussed Hgb level and aranesp therapy with Dr.Penland. She said we will continue with current aranesp dose and frequency for another 4-6 weeks. If there is no improvement she will address issue then. Return as scheduled.  Thank you for choosing [place] at [facility] to provide your oncology and hematology care.  To afford each patient quality time with our provider, please arrive at least 15 minutes before your scheduled appointment time.    You need to re-schedule your appointment should you arrive 10 or more minutes late.  We strive to give you quality time with our providers, and arriving late affects you and other patients whose appointments are after yours.  Also, if you no show three or more times for appointments you may be dismissed from the clinic at the providers discretion.     Again, thank you for choosing [facility].  Our hope is that these requests will decrease the amount of time that you wait before being seen by our physicians.       _____________________________________________________________  Should you have questions after your visit to [facility], please contact our office at [phone] between the hours of 8:30 a.m. and 4:30 p.m.  Voicemails left after 4:30 p.m. will not be returned until the following business day.  For prescription refill requests, have your pharmacy contact our office.

## 2015-10-04 ENCOUNTER — Ambulatory Visit (INDEPENDENT_AMBULATORY_CARE_PROVIDER_SITE_OTHER): Payer: Medicare Other | Admitting: Gastroenterology

## 2015-10-04 ENCOUNTER — Telehealth: Payer: Self-pay

## 2015-10-04 ENCOUNTER — Encounter: Payer: Self-pay | Admitting: Gastroenterology

## 2015-10-04 VITALS — BP 157/47 | HR 60 | Temp 97.0°F | Ht 65.0 in | Wt 214.4 lb

## 2015-10-04 DIAGNOSIS — D631 Anemia in chronic kidney disease: Secondary | ICD-10-CM

## 2015-10-04 DIAGNOSIS — R195 Other fecal abnormalities: Secondary | ICD-10-CM

## 2015-10-04 DIAGNOSIS — N184 Chronic kidney disease, stage 4 (severe): Secondary | ICD-10-CM

## 2015-10-04 NOTE — Telephone Encounter (Signed)
Lattie Haw,   This pt was seen by Laban Emperor, NP in our office today for Heme positive stool and anemia.  She would like to scheduled TCS and possible EGD with Dr. [name] in the very near future.  Please advise if pt may old coumadin for 4 days prior to procedures and advise about the Lovenox Bridge.  Pt said he already has an appt with you on Monday.

## 2015-10-04 NOTE — Patient Instructions (Signed)
We will double check with Lattie Haw that we can hold the Coumadin and do the Lovenox shots. Then, we can schedule the procedures.  You will need a colonoscopy and possible upper endoscopy in the near future.

## 2015-10-04 NOTE — Progress Notes (Signed)
Primary Care Physician:  [name], MD Primary Gastroenterologist:  Dr. [name]   Chief Complaint  Patient presents with  . positive stool card    HPI:   [name] is a 70 y.o. female presenting today at the request of Kirby Crigler, [place] with Hematology, secondary to heme positive stool. She has been followed for anemia in chronic renal disease. Hgb has slowly declined from around the 9 range to the 8. Receives Aranesp injections periodically. She is on chronic Coumadin, with a history of an upper extremity DVT in  June 2015, history of aflutter.   No overt GI bleeding. No abdominal pain. No N/V. No reflux exacerbation or dysphagia. No weight loss or lack of appetite. Prednisone chronically. Receives Aranesp injections. Last colonoscopy/EGD in 2013 by Dr. [name], performed due to anemia while hospitalized, noting multiple tubular adenomas and EGD with duodenal erosion and possible resolving ulcer at the angularis.   Past Medical History  Diagnosis Date  . Asthma   . Essential hypertension, benign   . Type 2 diabetes mellitus ([place])   . SLE (systemic lupus erythematosus) ([place])   . Anemia   . Atrial flutter [facility])     TEE/DCCV December 2013 - on Xarelto [facility])  . CKD (chronic kidney disease) stage 3, GFR 30-59 ml/min   . Bone spur of toe     Right 5th toe  . UTI (lower urinary tract infection) 07/11/15  . Anemia of chronic renal failure, stage 4 (severe) ([place]) 02/23/2014  . DVT of upper extremity (deep vein thrombosis) ([facility])     right basilic vein, June [id number]    Past Surgical History  Procedure Laterality Date  . Ankle surgery  1993  . Back surgery  1980  . Abdominal hysterectomy  1983  . Tee without cardioversion  09/16/2012    Procedure: TRANSESOPHAGEAL ECHOCARDIOGRAM (TEE);  Surgeon: Sanda Klein, MD;  Location: [facility] ENDOSCOPY;  Service: Cardiovascular;  Laterality: N/A;  . Cardioversion  09/16/2012    Procedure: CARDIOVERSION;  Surgeon: Sanda Klein,  MD;  Location: [facility] ENDOSCOPY;  Service: Cardiovascular;  Laterality: N/A;  . Esophagogastroduodenoscopy  09/20/2012    Dr. [name]: duodenal erosion and possible resolving ulcer at the angularis   . Colonoscopy  09/20/2012    Dr. [name]: multiple tubular adenomas   . Av fistula placement Left 03/27/2014    Procedure: ARTERIOVENOUS (AV) FISTULA CREATION;  Surgeon: Rosetta Posner, MD;  Location: [facility] OR;  Service: Vascular;  Laterality: Left;  . Cardiovascular stress test  12/19/2009    no stress induced rhythm abnormalities, ekg negative for ischemia  . Transesophageal echocardiogram with cardioversion  09/16/2012    EF 60-65%, moderate LVH, moderate regurg of the aortic valve, LA moderately dilated  . Colonoscopy  2005    Dr. [name]. Polyps, path unknown     Current Outpatient Prescriptions  Medication Sig Dispense Refill  . acetaminophen (TYLENOL) 500 MG tablet Take 1,000 mg by mouth every 6 (six) hours as needed. For pain    . albuterol (ACCUNEB) 0.63 MG/3ML nebulizer solution 1 ampule.    Marland Kitchen albuterol (PROVENTIL HFA;VENTOLIN HFA) 108 (90 BASE) MCG/ACT inhaler Inhale 2 puffs into the lungs every 6 (six) hours as needed. For shortness of breath    . amLODipine (NORVASC) 10 MG tablet Take 1 tablet (10 mg total) by mouth at bedtime. 30 tablet 0  . atorvastatin (LIPITOR) 40 MG tablet Take 40 mg by mouth daily. 1/2 tablet 20 mg    .  brimonidine-timolol (COMBIGAN) 0.2-0.5 % ophthalmic solution 1 drop.    . calcitRIOL (ROCALTROL) 0.25 MCG capsule Take 0.25 mcg by mouth as directed. Take on Mondays Wednesdays and Fridays    . diclofenac (VOLTAREN) 0.1 % ophthalmic solution Place 1 drop into the left eye daily. Uses at lunch time    . folic acid (FOLVITE) 1 MG tablet Take 1 mg by mouth daily.    . furosemide (LASIX) 80 MG tablet Take 80 mg by mouth as directed. Takes lasix 2 tablets am & 1 tablet pm except on Mondays and Thursdays she takes 2 tablets am and 2 tablets pm    . HYDROcodone-acetaminophen  (NORCO/VICODIN) 5-325 MG per tablet Take 1-2 tablets by mouth every 4 (four) hours as needed for moderate pain. 10 tablet 0  . hydroxychloroquine (PLAQUENIL) 200 MG tablet Take 200 mg by mouth 2 (two) times daily.    . insulin aspart (NOVOLOG) 100 UNIT/ML injection Inject 5-11 Units into the skin 3 (three) times daily with meals.    . Insulin Detemir (LEVEMIR) 100 UNIT/ML Pen Inject 12 Units into the skin at bedtime. 15 mL 2  . metoprolol (LOPRESSOR) 50 MG tablet TAKE ONE TABLET BY MOUTH TWICE DAILY. 60 tablet 6  . Multiple Vitamins-Minerals (CENTRUM SILVER PO) Take 1 tablet by mouth daily.    Glory Rosebush DELICA LANCETS 99991111 MISC USE TO TEST 4 TIMES DAILY. 100 each 5  . predniSONE (DELTASONE) 20 MG tablet Take 10 mg by mouth as directed. Pt takes 10 mg and 5mg  alternating days    . travoprost, benzalkonium, (TRAVATAN) 0.004 % ophthalmic solution Place 1 drop into the left eye at bedtime.    . Vitamin D, Ergocalciferol, (DRISDOL) 50000 UNITS CAPS capsule TAKE ONE CAPSULE BY MOUTH ONCE WEEKLY. 12 capsule 0  . warfarin (COUMADIN) 5 MG tablet Take 1 1/2 tablets daily except 1 tablet on Thursdays (Patient taking differently: Take 1 1/2 tablets daily except 1 tablet on Thursdays, Tuesday, and Saturday) 180 tablet 3   No current facility-administered medications for this visit.    Allergies as of 10/04/2015 - Review Complete 10/04/2015  Allergen Reaction Noted  . Ace inhibitors  06/15/2015    Family History  Problem Relation Age of Onset  . Diabetes Sister   . Diabetes Brother   . Diabetes Father   . Hypertension Father   . Hypertension Sister   . Hypertension Brother   . Stroke Mother   . Colon cancer Neg Hx     Social History   Social History  . Marital Status: Married    Spouse Name: N/A  . Number of Children: N/A  . Years of Education: N/A   Occupational History  . Not on file.   Social History Main Topics  . Smoking status: Former Smoker -- 1.00 packs/day for 30 years     Types: Cigarettes    Quit date: 08/29/2012  . Smokeless tobacco: Never Used     Comment: quit 08-2012  . Alcohol Use: No  . Drug Use: No  . Sexual Activity: Yes   Other Topics Concern  . Not on file   Social History Narrative    Review of Systems: Gen: Denies any fever, chills, fatigue, weight loss, lack of appetite.  CV: Denies chest pain, heart palpitations, peripheral edema, syncope.  Resp: occasional DOE  GI: see HPI  GU : Denies urinary burning, urinary frequency, urinary hesitancy MS: +joint pain  Derm: Denies rash, itching, dry skin Psych: Denies depression, anxiety, memory loss,  and confusion Heme: see HPI .  Physical Exam: BP 157/47 mmHg  Pulse 60  Temp(Src) 97 F (36.1 C)  Ht 5\' 5"  (1.651 m)  Wt 214 lb 6.4 oz (97.251 kg)  BMI 35.68 kg/m2 General:   Alert and oriented. Pleasant and cooperative. Well-nourished and well-developed.  Head:  Normocephalic and atraumatic. Eyes:  Without icterus, sclera clear and conjunctiva pink.  Ears:  Normal auditory acuity. Nose:  No deformity, discharge,  or lesions. Mouth:  No deformity or lesions, oral mucosa pink.  Lungs:  Mild expiratory wheeze bilaterally  Heart:  S1, S2 present without murmurs appreciated.  Abdomen:  +BS, soft, non-tender and non-distended. No HSM noted. No guarding or rebound. No masses appreciated.  Rectal:  Deferred  Msk:  Symmetrical without gross deformities. Normal posture. Extremities:  Without edema. Neurologic:  Alert and  oriented x4;  grossly normal neurologically. Skin:  Intact without significant lesions or rashes. Psych:  Alert and cooperative. Normal mood and affect.  Lab Results  Component Value Date   WBC 8.0 10/03/2015   HGB 8.8* 10/03/2015   HCT 28.9* 10/03/2015   MCV 85.8 10/03/2015   PLT 271 10/03/2015   Lab Results  Component Value Date   IRON 44 02/12/2015   TIBC 272 02/12/2015   FERRITIN 112 09/19/2015

## 2015-10-04 NOTE — Telephone Encounter (Signed)
Gay Filler, Please advise.  CHADS2 = 3   CHA2DS2Vasc= 5 Thanks

## 2015-10-05 ENCOUNTER — Other Ambulatory Visit: Payer: Self-pay | Admitting: "Endocrinology

## 2015-10-05 DIAGNOSIS — J209 Acute bronchitis, unspecified: Secondary | ICD-10-CM | POA: Diagnosis not present

## 2015-10-05 DIAGNOSIS — Z1389 Encounter for screening for other disorder: Secondary | ICD-10-CM | POA: Diagnosis not present

## 2015-10-05 DIAGNOSIS — J069 Acute upper respiratory infection, unspecified: Secondary | ICD-10-CM | POA: Diagnosis not present

## 2015-10-05 NOTE — Telephone Encounter (Signed)
Per protocol, okay to hold Coumadin prior to procedure with no Lovenox bridge.  Will advise pt at appt on 1/9

## 2015-10-08 ENCOUNTER — Ambulatory Visit (INDEPENDENT_AMBULATORY_CARE_PROVIDER_SITE_OTHER): Payer: Medicare Other | Admitting: *Deleted

## 2015-10-08 DIAGNOSIS — I4892 Unspecified atrial flutter: Secondary | ICD-10-CM | POA: Diagnosis not present

## 2015-10-08 DIAGNOSIS — Z5181 Encounter for therapeutic drug level monitoring: Secondary | ICD-10-CM | POA: Diagnosis not present

## 2015-10-08 LAB — POCT INR: INR: 3.8

## 2015-10-08 NOTE — Telephone Encounter (Signed)
Noted  

## 2015-10-08 NOTE — Telephone Encounter (Signed)
Noted! Sending to Laban Emperor, NP and Ginger.

## 2015-10-09 ENCOUNTER — Other Ambulatory Visit: Payer: Self-pay

## 2015-10-09 DIAGNOSIS — D649 Anemia, unspecified: Secondary | ICD-10-CM

## 2015-10-09 DIAGNOSIS — R195 Other fecal abnormalities: Secondary | ICD-10-CM

## 2015-10-09 MED ORDER — PEG 3350-KCL-NA BICARB-NACL 420 G PO SOLR: 4000.0000 mL | Freq: Once | ORAL | Status: DC

## 2015-10-09 NOTE — Assessment & Plan Note (Addendum)
70 year old female with history of anemia but drifting Hgb recently, normal iron and ferritin, taking Aranesp. Likely anemia is secondary to known chronic renal disease. However, she is on chronic Coumadin and has had evidence of heme positive stool. No overt GI bleeding or any concerning GI symptoms. She also takes Prednisone chronically. With drifting Hgb and heme positive stool, deserves early interval colonoscopy+/- EGD with Dr. [name]. Last TCS/EGD in 2013 by Dr. [name] with multiple tubular adenomas, EGD with duodenal erosion and possible resolving ulcer at angularis.   Proceed with TCS +/- EGD with Dr. [name] in near future: the risks, benefits, and alternatives have been discussed with the patient in detail. The patient states understanding and desires to proceed. HOLD COUMADIN X 4 days prior. NO NEED FOR BRIDGING PER CARDIOLOGY.  I would recommend capsule study if negative TCS/EGD.

## 2015-10-09 NOTE — Telephone Encounter (Signed)
Pt is set up for 10/24/15 @ 2:30. Instructions are in the mail. [name] is aware of the date and time

## 2015-10-10 ENCOUNTER — Encounter: Payer: Self-pay | Admitting: Nutrition

## 2015-10-10 ENCOUNTER — Encounter: Payer: Medicare Other | Attending: Family Medicine | Admitting: Nutrition

## 2015-10-10 VITALS — Ht 65.0 in | Wt 214.4 lb

## 2015-10-10 DIAGNOSIS — Z029 Encounter for administrative examinations, unspecified: Secondary | ICD-10-CM | POA: Diagnosis present

## 2015-10-10 DIAGNOSIS — N189 Chronic kidney disease, unspecified: Secondary | ICD-10-CM

## 2015-10-10 DIAGNOSIS — E118 Type 2 diabetes mellitus with unspecified complications: Secondary | ICD-10-CM

## 2015-10-10 DIAGNOSIS — E669 Obesity, unspecified: Secondary | ICD-10-CM

## 2015-10-10 NOTE — Progress Notes (Signed)
CC'ED TO PCP 

## 2015-10-10 NOTE — Progress Notes (Signed)
  Medical Nutrition Therapy:  Appt start time: 1100 end time:  1130  Assessment:  Primary concerns today: DIabetes Follow up and CKD. Here with her husband today. A1C improved from 6.7% down to 6.1%.   FBS;78-137 mg/dl. 109-132 and then bedtime 114-257 mg/d;.  5 units of Humalog with meals and now 12 units of  Levemir down from 15 units a day.  Admits to needing to walk more and exercise. Meets with kidney doctor next few weeks. Has cut out snacks. Avoiding foods high in potassium. Lost 1 lb since last visit. Feels good. USing only Mrs. Dash and no salt or salt substitutes with meals. Hardly eating any processed foods.   Diet is fairly well balanced. Still could benefit from more low carb vegetables with fiber.  No low blood sugars recently. Sees Dr. Dorris Fetch.  Lab Results  Component Value Date   HGBA1C 6.1* 09/19/2015   Rreferred Learning Style:   Auditory  Visual  Hands on  No preference indicated   Learning Readiness:   Ready  Change in progress  MEDICATIONS: See list   DIETARY INTAKE:  24-hr recall:  B ( AM): 1 slice toast, oatmeal and sometimes boiled egg. coffee Snk ( AM): none;   L ( PM): Beef homemade; beef, potaatoes, peas, green beans, onions,  1- 1/2 c, potato chip lightly salted, water Snk ( PM): Fried chicken- thigh, coleslaw, 1 slice bread,  Sprite zero,  none D ( PM): Snk ( PM):  Beverages: water  Usual physical activity: limited  Estimated energy needs: 1200 calories 135 g carbohydrates 90 g protein 33 g fat  Progress Towards Goal(s):  In progress.   Nutritional Diagnosis:  NB-1.1 Food and nutrition-related knowledge deficit As related to Diabetes.  As evidenced by A1C 7.3%.    Intervention:  Nutrition counseling and diabetes education on diet, exercise, medications and complications. Plan:  1. Walk 15 minutes a day. 2. Only use Mrs. Dash and other herbs and spices to cook with 3. Increase low carb vegetables to 2 servings with lunch and dinner. 4. Eat  2-3 carb choices per meal to prevent low blood sugars between meals. 5. Talk to kidney doctor about diet restrictions for kidneys; potassium or phosphorus and protein. 6. Keep up the [place]!! 7. Lose 2 lbs per month.  Teaching Method Utilized:  Visual Auditory Hands on  Handouts given during visit include: The Plate Method Iron rich foods Chronic Kidney Disease Nutrition Low potassium diet Low sodium diet  Barriers to learning/adherence to lifestyle change: none  Demonstrated degree of understanding via:  Teach Back   Monitoring/Evaluation:  Dietary intake, exercise, meal planning, SBG, and body weight in  3 month(s).

## 2015-10-10 NOTE — Patient Instructions (Addendum)
  Plan:  1. Walk 15 minutes a day. 2. Only use Mrs. Dash and other herbs and spices to cook with 3. Increase low carb vegetables to 2 servings with lunch and dinner. 4. Eat 2-3 carb choices per meal to prevent low blood sugars between meals. 5. Talk to kidney doctor about diet restrictions for kidneys; potassium or phosphorus and protein. 6. Keep up the [place]!! 7. Lose 2 lbs per month.

## 2015-10-11 DIAGNOSIS — E113213 Type 2 diabetes mellitus with mild nonproliferative diabetic retinopathy with macular edema, bilateral: Secondary | ICD-10-CM | POA: Diagnosis not present

## 2015-10-11 DIAGNOSIS — Z961 Presence of intraocular lens: Secondary | ICD-10-CM | POA: Diagnosis not present

## 2015-10-16 DIAGNOSIS — L89892 Pressure ulcer of other site, stage 2: Secondary | ICD-10-CM | POA: Diagnosis not present

## 2015-10-16 DIAGNOSIS — E1140 Type 2 diabetes mellitus with diabetic neuropathy, unspecified: Secondary | ICD-10-CM | POA: Diagnosis not present

## 2015-10-16 DIAGNOSIS — E1151 Type 2 diabetes mellitus with diabetic peripheral angiopathy without gangrene: Secondary | ICD-10-CM | POA: Diagnosis not present

## 2015-10-17 ENCOUNTER — Encounter (HOSPITAL_COMMUNITY): Payer: Medicare Other

## 2015-10-17 ENCOUNTER — Encounter (HOSPITAL_BASED_OUTPATIENT_CLINIC_OR_DEPARTMENT_OTHER): Payer: Medicare Other

## 2015-10-17 ENCOUNTER — Telehealth: Payer: Self-pay

## 2015-10-17 VITALS — BP 143/40 | HR 58 | Temp 98.0°F | Resp 18

## 2015-10-17 DIAGNOSIS — D631 Anemia in chronic kidney disease: Secondary | ICD-10-CM | POA: Diagnosis not present

## 2015-10-17 DIAGNOSIS — N189 Chronic kidney disease, unspecified: Secondary | ICD-10-CM | POA: Diagnosis not present

## 2015-10-17 DIAGNOSIS — N058 Unspecified nephritic syndrome with other morphologic changes: Secondary | ICD-10-CM

## 2015-10-17 DIAGNOSIS — N184 Chronic kidney disease, stage 4 (severe): Principal | ICD-10-CM

## 2015-10-17 DIAGNOSIS — N179 Acute kidney failure, unspecified: Secondary | ICD-10-CM | POA: Diagnosis not present

## 2015-10-17 DIAGNOSIS — E538 Deficiency of other specified B group vitamins: Secondary | ICD-10-CM | POA: Diagnosis not present

## 2015-10-17 LAB — CBC
HCT: 31.3 % — ABNORMAL LOW (ref 36.0–46.0)
HEMOGLOBIN: 9.5 g/dL — AB (ref 12.0–15.0)
MCH: 26.2 pg (ref 26.0–34.0)
MCHC: 30.4 g/dL (ref 30.0–36.0)
MCV: 86.5 fL (ref 78.0–100.0)
PLATELETS: 245 10*3/uL (ref 150–400)
RBC: 3.62 MIL/uL — AB (ref 3.87–5.11)
RDW: 16.2 % — ABNORMAL HIGH (ref 11.5–15.5)
WBC: 9.1 10*3/uL (ref 4.0–10.5)

## 2015-10-17 MED ORDER — DARBEPOETIN ALFA 150 MCG/0.3ML IJ SOSY
125.0000 ug | PREFILLED_SYRINGE | Freq: Once | INTRAMUSCULAR | Status: AC
  Administered 2015-10-17: 125 ug via SUBCUTANEOUS

## 2015-10-17 NOTE — Progress Notes (Signed)
Sean F Skog presents today for injection per MD orders. Aranesp 125 mcg administered SQ in right Abdomen. Administration without incident. Patient tolerated well.  

## 2015-10-17 NOTE — Patient Instructions (Signed)
[place] at [facility] Discharge Instructions  RECOMMENDATIONS MADE BY THE CONSULTANT AND ANY TEST RESULTS WILL BE SENT TO YOUR REFERRING PHYSICIAN.  Aranesp today.  Return as scheduled.    Thank you for choosing [place] at [facility] to provide your oncology and hematology care.  To afford each patient quality time with our provider, please arrive at least 15 minutes before your scheduled appointment time.   Beginning January 23rd 2017 lab work for the [facility] will be done in the  Main lab at [facility] on 1st floor. If you have a lab appointment with the [place] please come in thru the  Main Entrance and check in at the main information desk  You need to re-schedule your appointment should you arrive 10 or more minutes late.  We strive to give you quality time with our providers, and arriving late affects you and other patients whose appointments are after yours.  Also, if you no show three or more times for appointments you may be dismissed from the clinic at the providers discretion.     Again, thank you for choosing [facility].  Our hope is that these requests will decrease the amount of time that you wait before being seen by our physicians.       _____________________________________________________________  Should you have questions after your visit to [facility], please contact our office at [phone] between the hours of 8:30 a.m. and 4:30 p.m.  Voicemails left after 4:30 p.m. will not be returned until the following business day.  For prescription refill requests, have your pharmacy contact our office.

## 2015-10-17 NOTE — Telephone Encounter (Signed)
Pt and her husband came by the office to talk about her up coming TCS. He feels like she will not be able to have the TCS because she is a diabatic and has to take insuline shots and she is in kidney failure. It is just to much that could hurt her to have the TCS. They would like to know what other test could be done. Please advise

## 2015-10-19 NOTE — Telephone Encounter (Signed)
I highly recommend at a minimum lower GI evaluation via colonoscopy due to heme positive stool. History of multiple adenomas in past. Unable to rule out occult process. However, if she is absolutely declining this, could set up virtual colonoscopy. However, if any abnormalities noted, then would need colonoscopy (such as polyps). However, it would be best to go straight to a colonoscopy.

## 2015-10-24 ENCOUNTER — Encounter (HOSPITAL_COMMUNITY): Payer: Self-pay

## 2015-10-24 ENCOUNTER — Ambulatory Visit (HOSPITAL_COMMUNITY): Admit: 2015-10-24 | Payer: Medicare Other | Admitting: Internal Medicine

## 2015-10-24 SURGERY — COLONOSCOPY
Anesthesia: Moderate Sedation

## 2015-10-25 NOTE — Telephone Encounter (Signed)
I talked with the patients husband and he would not like to have her do the TCS. He feel that we need to talk with her other doctors and she has to eat food when she takes her insuline

## 2015-10-29 ENCOUNTER — Ambulatory Visit (INDEPENDENT_AMBULATORY_CARE_PROVIDER_SITE_OTHER): Payer: Medicare Other | Admitting: *Deleted

## 2015-10-29 DIAGNOSIS — Z5181 Encounter for therapeutic drug level monitoring: Secondary | ICD-10-CM

## 2015-10-29 DIAGNOSIS — I4892 Unspecified atrial flutter: Secondary | ICD-10-CM

## 2015-10-29 LAB — POCT INR: INR: 2.4

## 2015-10-30 NOTE — Telephone Encounter (Signed)
Noted  

## 2015-10-31 ENCOUNTER — Other Ambulatory Visit (HOSPITAL_COMMUNITY): Payer: Medicare Other

## 2015-10-31 ENCOUNTER — Ambulatory Visit (HOSPITAL_COMMUNITY): Payer: Medicare Other | Admitting: Oncology

## 2015-10-31 ENCOUNTER — Encounter (HOSPITAL_COMMUNITY): Payer: Medicare Other

## 2015-10-31 DIAGNOSIS — Z7952 Long term (current) use of systemic steroids: Secondary | ICD-10-CM | POA: Diagnosis not present

## 2015-10-31 DIAGNOSIS — M810 Age-related osteoporosis without current pathological fracture: Secondary | ICD-10-CM | POA: Diagnosis not present

## 2015-10-31 DIAGNOSIS — M329 Systemic lupus erythematosus, unspecified: Secondary | ICD-10-CM | POA: Diagnosis not present

## 2015-10-31 DIAGNOSIS — M150 Primary generalized (osteo)arthritis: Secondary | ICD-10-CM | POA: Diagnosis not present

## 2015-10-31 NOTE — Assessment & Plan Note (Addendum)
Anemia of chronic renal disease with an anemia of chronic disease/inflammation with FSG in the setting of SLE (followed by Dr. Dossie Der).  Labs every 2 weeks: CBC   Labs every 6 weeks: Ferritin  Ferritin today was less than 100. I used a HGB of 11 g/dL and calculated her iron deficit call for 2 doses of ferric gluconate.  Orders are placed and signed and held for ferric gluconate 125 mg x 2.  This will be scheduled in the future.  Continue Aranesp 125 mcg every 2 weeks.  Aranesp to be given today.  She now sees Dr. Dossie Der, Rheumatology.  She is currently on Plaquenil and Prednisone 10 mg daily.    She cancelled her colonoscopy because "there was too much going on."  She did have positive stool cards and thus, the referral to GI for colonoscopy.  She and her husband think that a colonoscopy given her other medical issues will be too difficult.  Return in 12 weeks for follow-up.

## 2015-10-31 NOTE — Progress Notes (Signed)
Dana Kilts, MD [place] [id number]  Anemia of chronic renal failure, stage 4 (severe) (HCC)  CURRENT THERAPY: Aranesp 125 mcg every 2 weeks beginning on 09/05/2015 after being on Aranesp 100 mcg beginning on 05/16/2015.  IV iron as needed.   INTERVAL HISTORY: [name] 70 y.o. female returns for followup of anemia of chronic renal disease in the setting of SLE (followed by Dr. 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  No bone marrow aspiration and biopsy documented.  I personally reviewed and went over laboratory results with the patient.  The results are noted within this dictation.  These are updated today.  Her HGB is 9.9 g/dL.  We will give her Aranesp today.  She was set-up for a colonoscopy and they cancelled this because there was "too much going on."  She did have positive stool cards and thus, the referral to GI for colonoscopy.  She and her husband think that a colonoscopy given her other medical issues will be too difficult.  She asks if she can take Tylenol with Coumadin.  This combination can increase her INR and therefore, I will defer that to the prescriber of her Coumadin.  Past Medical History  Diagnosis Date  . Asthma   . Essential hypertension, benign   . Type 2 diabetes mellitus ([place])   . SLE (systemic lupus erythematosus) ([place])   . Anemia   . Atrial flutter [facility])     TEE/DCCV December 2013 - on Xarelto [facility])  . CKD (chronic kidney disease) stage 3, GFR 30-59 ml/min   . Bone spur of toe     Right 5th toe  . UTI (lower urinary tract infection) 07/11/15  . Anemia of chronic renal failure, stage 4 (severe) ([place]) 02/23/2014  . 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     is allergic to ace inhibitors.  Current Outpatient Prescriptions on File Prior to Visit  Medication Sig Dispense Refill  . acetaminophen (TYLENOL) 500 MG tablet Take 1,000 mg by mouth every 6 (six) hours as needed. For pain    . albuterol (ACCUNEB) 0.63 MG/3ML nebulizer solution 1 ampule.    Marland Kitchen albuterol (PROVENTIL HFA;VENTOLIN HFA) 108 (90 BASE) MCG/ACT inhaler Inhale 2 puffs into the lungs every 6 (six) hours as needed. For shortness of breath    . amLODipine (NORVASC) 10 MG tablet Take 1 tablet (10 mg total) by mouth at bedtime. 30 tablet 0  . atorvastatin (LIPITOR) 40 MG tablet Take 40 mg by mouth daily. 1/2 tablet 20 mg    . brimonidine-timolol (COMBIGAN) 0.2-0.5 % ophthalmic solution Apply 1 drop to eye daily.     . calcitRIOL (ROCALTROL) 0.25 MCG capsule Take 0.25 mcg by  mouth as directed. Take on Mondays Wednesdays and Fridays    . diclofenac (VOLTAREN) 0.1 % ophthalmic solution Place 1 drop into the left eye daily. Uses at lunch time    . folic acid (FOLVITE) 1 MG tablet Take 1 mg by mouth daily.    . furosemide (LASIX) 80 MG tablet Take 80 mg by mouth as directed. Takes lasix 2 tablets am & 1 tablet pm except on Mondays and Thursdays she takes 2 tablets am and 2 tablets pm    . HYDROcodone-acetaminophen  (NORCO/VICODIN) 5-325 MG per tablet Take 1-2 tablets by mouth every 4 (four) hours as needed for moderate pain. 10 tablet 0  . hydroxychloroquine (PLAQUENIL) 200 MG tablet Take 200 mg by mouth 2 (two) times daily.    . insulin aspart (NOVOLOG) 100 UNIT/ML injection Inject 5-11 Units into the skin 3 (three) times daily with meals.    . Insulin Detemir (LEVEMIR) 100 UNIT/ML Pen Inject 12 Units into the skin at bedtime. 15 mL 2  . metoprolol (LOPRESSOR) 50 MG tablet TAKE ONE TABLET BY MOUTH TWICE DAILY. 60 tablet 6  . Multiple Vitamins-Minerals (CENTRUM SILVER PO) Take 1 tablet by mouth daily.    . ONE TOUCH ULTRA TEST test strip TEST 4 TIMES A DAY AS DIRECTED. 150 each 5  . ONETOUCH DELICA LANCETS 99991111 MISC USE TO TEST 4 TIMES DAILY. 100 each 5  . polyethylene glycol-electrolytes (NULYTELY/GOLYTELY) 420 g solution Take 4,000 mLs by mouth once. 4000 mL 0  . predniSONE (DELTASONE) 20 MG tablet Take 10 mg by mouth as directed. Pt takes 10 mg and 5mg  alternating days    . travoprost, benzalkonium, (TRAVATAN) 0.004 % ophthalmic solution Place 1 drop into the left eye at bedtime.    . Vitamin D, Ergocalciferol, (DRISDOL) 50000 UNITS CAPS capsule TAKE ONE CAPSULE BY MOUTH ONCE WEEKLY. 12 capsule 0  . warfarin (COUMADIN) 5 MG tablet Take 1 1/2 tablets daily except 1 tablet on Thursdays (Patient taking differently: Take 1 1/2 tablets daily except 1 tablet on Thursdays, Tuesday, and Saturday) 180 tablet 3   No current facility-administered medications on file prior to visit.    Past Surgical History  Procedure Laterality Date  . Ankle surgery  1993  . Back surgery  1980  . Abdominal hysterectomy  1983  . Tee without cardioversion  09/16/2012    Procedure: TRANSESOPHAGEAL ECHOCARDIOGRAM (TEE);  Surgeon: Sanda Klein, MD;  Location: [facility] ENDOSCOPY;  Service: Cardiovascular;  Laterality: N/A;  . Cardioversion  09/16/2012    Procedure: CARDIOVERSION;  Surgeon: Sanda Klein, MD;  Location: [facility] ENDOSCOPY;   Service: Cardiovascular;  Laterality: N/A;  . Esophagogastroduodenoscopy  09/20/2012    Dr. [name]: duodenal erosion and possible resolving ulcer at the angularis   . Colonoscopy  09/20/2012    Dr. [name]: multiple tubular adenomas   . Av fistula placement Left 03/27/2014    Procedure: ARTERIOVENOUS (AV) FISTULA CREATION;  Surgeon: Rosetta Posner, MD;  Location: [facility] OR;  Service: Vascular;  Laterality: Left;  . Cardiovascular stress test  12/19/2009    no stress induced rhythm abnormalities, ekg negative for ischemia  . Transesophageal echocardiogram with cardioversion  09/16/2012    EF 60-65%, moderate LVH, moderate regurg of the aortic valve, LA moderately dilated  . Colonoscopy  2005    Dr. [name]. Polyps, path unknown     Denies any headaches, dizziness, double vision, fevers, chills, night sweats, nausea, vomiting, diarrhea, constipation, chest pain, heart palpitations, shortness of breath, blood in stool, black  tarry stool, urinary pain, urinary burning, urinary frequency, hematuria.   PHYSICAL EXAMINATION  ECOG PERFORMANCE STATUS: 1 - Symptomatic but completely ambulatory  Filed Vitals:   11/01/15 1339  BP: 166/67  Pulse: 59  Temp: 97.9 F (36.6 C)  Resp: 18    GENERAL:alert, no distress, comfortable, cooperative, obese, smiling and accompanied by husband SKIN: skin color, texture, turgor are normal, no rashes or significant lesions HEAD: Normocephalic, No masses, lesions, tenderness or abnormalities EYES: normal, PERRLA, EOMI, Conjunctiva are pink and non-injected EARS: External ears normal. OROPHARYNX:lips, buccal mucosa, and tongue normal and mucous membranes are moist  NECK: supple, thyroid normal size, non-tender, without nodularity, no stridor, non-tender, trachea midline LYMPH:  no palpable lymphadenopathy BREAST:not examined LUNGS: clear to auscultation  HEART: regular rate & rhythm ABDOMEN:abdomen soft, obese and normal bowel sounds BACK: Back symmetric, no  curvature. EXTREMITIES:less then 2 second capillary refill, no joint deformities, effusion, or inflammation, no skin discoloration, no cyanosis  NEURO: alert & oriented x 3 with fluent speech, no focal motor/sensory deficits, gait normal   LABORATORY DATA: CBC    Component Value Date/Time   WBC 6.7 11/01/2015 1242   RBC 3.77* 11/01/2015 1242   RBC 2.63* 02/16/2014 1400   HGB 9.9* 11/01/2015 1242   HCT 32.0* 11/01/2015 1242   PLT 208 11/01/2015 1242   MCV 84.9 11/01/2015 1242   MCH 26.3 11/01/2015 1242   MCHC 30.9 11/01/2015 1242   RDW 16.5* 11/01/2015 1242   LYMPHSABS 0.9 05/07/2015 1320   MONOABS 0.3 05/07/2015 1320   EOSABS 0.0 05/07/2015 1320   BASOSABS 0.0 05/07/2015 1320      Chemistry      Component Value Date/Time   NA 143 09/19/2015 1105   NA 143 05/07/2015 1320   K 3.8 09/19/2015 1105   CL 102 09/19/2015 1105   CO2 24 09/19/2015 1105   BUN 52* 09/19/2015 1105   BUN 64* 05/07/2015 1320   CREATININE 2.16* 09/19/2015 1105      Component Value Date/Time   CALCIUM 8.5* 09/19/2015 1105   CALCIUM 7.0* 03/16/2014 0835   ALKPHOS 49 09/19/2015 1105   AST 19 09/19/2015 1105   ALT 12 09/19/2015 1105   BILITOT 0.3 09/19/2015 1105   BILITOT 0.4 05/07/2015 1320      Lab Results  Component Value Date   FERRITIN 88 11/01/2015   Lab Results  Component Value Date   FOLATE 75.0 05/07/2015   Lab Results  Component Value Date   CREATININE 2.16* 09/19/2015   Lab Results  Component Value Date   CRP 1.0* 09/05/2015   Lab Results  Component Value Date   ESRSEDRATE 118* 09/05/2015     PENDING LABS:   RADIOGRAPHIC STUDIES:  No results found.   PATHOLOGY:    ASSESSMENT AND PLAN:  Anemia of chronic renal failure, stage 4 (severe) (HCC) Anemia of chronic renal disease with an anemia of chronic disease/inflammation with FSG in the setting of SLE (followed by Dr. Dossie Der).  Labs every 2 weeks: CBC   Labs every 6 weeks: Ferritin  Ferritin today was less  than 100. I used a HGB of 11 g/dL and calculated her iron deficit call for 2 doses of ferric gluconate.  Orders are placed and signed and held for ferric gluconate 125 mg x 2.  This will be scheduled in the future.  Continue Aranesp 125 mcg every 2 weeks.  Aranesp to be given today.  She now sees Dr. Dossie Der, Rheumatology.  She is currently on Plaquenil  and Prednisone 10 mg daily.    She cancelled her colonoscopy because "there was too much going on."  She did have positive stool cards and thus, the referral to GI for colonoscopy.  She and her husband think that a colonoscopy given her other medical issues will be too difficult.  Return in 12 weeks for follow-up.    THERAPY PLAN:  Continue Aranesp 125 mcg every 2 weeks.  All questions were answered. The patient knows to call the clinic with any problems, questions or concerns. We can certainly see the patient much sooner if necessary.  Patient and plan discussed with Dr. Ancil Linsey and she is in agreement with the aforementioned.   This note is electronically signed by: Doy Mince 11/01/2015 5:35 PM

## 2015-11-01 ENCOUNTER — Encounter (HOSPITAL_COMMUNITY): Payer: Medicare Other

## 2015-11-01 ENCOUNTER — Encounter (HOSPITAL_COMMUNITY): Payer: Self-pay | Admitting: Oncology

## 2015-11-01 ENCOUNTER — Other Ambulatory Visit (HOSPITAL_COMMUNITY): Payer: Self-pay | Admitting: Oncology

## 2015-11-01 ENCOUNTER — Encounter (HOSPITAL_COMMUNITY): Payer: Medicare Other | Attending: Hematology & Oncology

## 2015-11-01 ENCOUNTER — Encounter (HOSPITAL_COMMUNITY): Payer: Medicare Other | Attending: Hematology & Oncology | Admitting: Oncology

## 2015-11-01 VITALS — BP 166/67 | HR 59 | Temp 97.9°F | Resp 18 | Wt 211.8 lb

## 2015-11-01 DIAGNOSIS — E538 Deficiency of other specified B group vitamins: Secondary | ICD-10-CM | POA: Insufficient documentation

## 2015-11-01 DIAGNOSIS — N184 Chronic kidney disease, stage 4 (severe): Principal | ICD-10-CM

## 2015-11-01 DIAGNOSIS — D631 Anemia in chronic kidney disease: Secondary | ICD-10-CM

## 2015-11-01 DIAGNOSIS — N179 Acute kidney failure, unspecified: Secondary | ICD-10-CM | POA: Diagnosis not present

## 2015-11-01 DIAGNOSIS — N189 Chronic kidney disease, unspecified: Secondary | ICD-10-CM | POA: Insufficient documentation

## 2015-11-01 DIAGNOSIS — E611 Iron deficiency: Secondary | ICD-10-CM

## 2015-11-01 DIAGNOSIS — N058 Unspecified nephritic syndrome with other morphologic changes: Secondary | ICD-10-CM

## 2015-11-01 LAB — CBC
HCT: 32 % — ABNORMAL LOW (ref 36.0–46.0)
HEMOGLOBIN: 9.9 g/dL — AB (ref 12.0–15.0)
MCH: 26.3 pg (ref 26.0–34.0)
MCHC: 30.9 g/dL (ref 30.0–36.0)
MCV: 84.9 fL (ref 78.0–100.0)
Platelets: 208 10*3/uL (ref 150–400)
RBC: 3.77 MIL/uL — ABNORMAL LOW (ref 3.87–5.11)
RDW: 16.5 % — AB (ref 11.5–15.5)
WBC: 6.7 10*3/uL (ref 4.0–10.5)

## 2015-11-01 LAB — FERRITIN: FERRITIN: 88 ng/mL (ref 11–307)

## 2015-11-01 MED ORDER — DARBEPOETIN ALFA 150 MCG/0.3ML IJ SOSY
125.0000 ug | PREFILLED_SYRINGE | Freq: Once | INTRAMUSCULAR | Status: AC
  Administered 2015-11-01: 125 ug via SUBCUTANEOUS

## 2015-11-01 MED ORDER — DARBEPOETIN ALFA 150 MCG/0.3ML IJ SOSY
PREFILLED_SYRINGE | INTRAMUSCULAR | Status: AC
  Filled 2015-11-01: qty 0.3

## 2015-11-01 NOTE — Progress Notes (Signed)
[name] presents today for injection per MD orders. Aranesp 125 mcg administered SQ in right Abdomen. Administration without incident. Patient tolerated well.  

## 2015-11-01 NOTE — Patient Instructions (Signed)
[place] at [facility] Discharge Instructions  RECOMMENDATIONS MADE BY THE CONSULTANT AND ANY TEST RESULTS WILL BE SENT TO YOUR REFERRING PHYSICIAN.  Exam and discussion today with Kirby Crigler, PA-C. Lab work and Aranesp injection today. Lab work and Aranesp every 2 weeks. Return in 12 weeks for office visit with Dr. Whitney Muse.   Thank you for choosing [place] at [facility] to provide your oncology and hematology care.  To afford each patient quality time with our provider, please arrive at least 15 minutes before your scheduled appointment time.   Beginning January 23rd 2017 lab work for the [facility] will be done in the  Main lab at [facility] on 1st floor. If you have a lab appointment with the [place] please come in thru the  Main Entrance and check in at the main information desk  You need to re-schedule your appointment should you arrive 10 or more minutes late.  We strive to give you quality time with our providers, and arriving late affects you and other patients whose appointments are after yours.  Also, if you no show three or more times for appointments you may be dismissed from the clinic at the providers discretion.     Again, thank you for choosing [facility].  Our hope is that these requests will decrease the amount of time that you wait before being seen by our physicians.       _____________________________________________________________  Should you have questions after your visit to [facility], please contact our office at [phone] between the hours of 8:30 a.m. and 4:30 p.m.  Voicemails left after 4:30 p.m. will not be returned until the following business day.  For prescription refill requests, have your pharmacy contact our office.

## 2015-11-01 NOTE — Patient Instructions (Signed)
[place] at [facility] Discharge Instructions  RECOMMENDATIONS MADE BY THE CONSULTANT AND ANY TEST RESULTS WILL BE SENT TO YOUR REFERRING PHYSICIAN.  Aranesp today.    Thank you for choosing [place] at [facility] to provide your oncology and hematology care.  To afford each patient quality time with our provider, please arrive at least 15 minutes before your scheduled appointment time.   Beginning January 23rd 2017 lab work for the [facility] will be done in the  Main lab at [facility] on 1st floor. If you have a lab appointment with the [place] please come in thru the  Main Entrance and check in at the main information desk  You need to re-schedule your appointment should you arrive 10 or more minutes late.  We strive to give you quality time with our providers, and arriving late affects you and other patients whose appointments are after yours.  Also, if you no show three or more times for appointments you may be dismissed from the clinic at the providers discretion.     Again, thank you for choosing [facility].  Our hope is that these requests will decrease the amount of time that you wait before being seen by our physicians.       _____________________________________________________________  Should you have questions after your visit to [facility], please contact our office at [phone] between the hours of 8:30 a.m. and 4:30 p.m.  Voicemails left after 4:30 p.m. will not be returned until the following business day.  For prescription refill requests, have your pharmacy contact our office.

## 2015-11-12 ENCOUNTER — Encounter (HOSPITAL_BASED_OUTPATIENT_CLINIC_OR_DEPARTMENT_OTHER): Payer: Medicare Other

## 2015-11-12 VITALS — BP 157/53 | HR 70 | Temp 98.3°F | Resp 20

## 2015-11-12 DIAGNOSIS — E1151 Type 2 diabetes mellitus with diabetic peripheral angiopathy without gangrene: Secondary | ICD-10-CM | POA: Diagnosis not present

## 2015-11-12 DIAGNOSIS — E611 Iron deficiency: Secondary | ICD-10-CM | POA: Diagnosis not present

## 2015-11-12 DIAGNOSIS — L89892 Pressure ulcer of other site, stage 2: Secondary | ICD-10-CM | POA: Diagnosis not present

## 2015-11-12 DIAGNOSIS — E1140 Type 2 diabetes mellitus with diabetic neuropathy, unspecified: Secondary | ICD-10-CM | POA: Diagnosis not present

## 2015-11-12 MED ORDER — SODIUM CHLORIDE 0.9 % IV SOLN
125.0000 mg | Freq: Once | INTRAVENOUS | Status: AC
  Administered 2015-11-12: 125 mg via INTRAVENOUS
  Filled 2015-11-12: qty 10

## 2015-11-12 NOTE — Progress Notes (Signed)
Patient tolerated infusion well.  VSS.   

## 2015-11-12 NOTE — Patient Instructions (Signed)
[facility] at [facility] Discharge Instructions  RECOMMENDATIONS MADE BY THE CONSULTANT AND ANY TEST RESULTS WILL BE SENT TO YOUR REFERRING PHYSICIAN.  Ferric gluconate 125 mg iron infusion given today as ordered. Return as scheduled.  Thank you for choosing [facility] at [facility] to provide your oncology and hematology care.  To afford each patient quality time with our provider, please arrive at least 15 minutes before your scheduled appointment time.   Beginning January 23rd 2017 lab work for the Cancer Center will be done in the  Main lab at [place] on 1st floor. If you have a lab appointment with the Cancer Center please come in thru the  Main Entrance and check in at the main information desk  You need to re-schedule your appointment should you arrive 10 or more minutes late.  We strive to give you quality time with our providers, and arriving late affects you and other patients whose appointments are after yours.  Also, if you no show three or more times for appointments you may be dismissed from the clinic at the providers discretion.     Again, thank you for choosing [facility].  Our hope is that these requests will decrease the amount of time that you wait before being seen by our physicians.       _____________________________________________________________  Should you have questions after your visit to [facility], please contact our office at [phone] between the hours of 8:30 a.m. and 4:30 p.m.  Voicemails left after 4:30 p.m. will not be returned until the following business day.  For prescription refill requests, have your pharmacy contact our office.    

## 2015-11-14 ENCOUNTER — Encounter (HOSPITAL_COMMUNITY): Payer: Medicare Other

## 2015-11-14 ENCOUNTER — Other Ambulatory Visit (HOSPITAL_COMMUNITY): Payer: Medicare Other

## 2015-11-14 ENCOUNTER — Ambulatory Visit (HOSPITAL_COMMUNITY): Payer: Medicare Other

## 2015-11-14 ENCOUNTER — Encounter (HOSPITAL_BASED_OUTPATIENT_CLINIC_OR_DEPARTMENT_OTHER): Payer: Medicare Other

## 2015-11-14 VITALS — BP 153/70 | HR 69 | Temp 98.1°F | Resp 20

## 2015-11-14 DIAGNOSIS — N189 Chronic kidney disease, unspecified: Secondary | ICD-10-CM | POA: Diagnosis not present

## 2015-11-14 DIAGNOSIS — E538 Deficiency of other specified B group vitamins: Secondary | ICD-10-CM | POA: Diagnosis not present

## 2015-11-14 DIAGNOSIS — D631 Anemia in chronic kidney disease: Secondary | ICD-10-CM | POA: Diagnosis not present

## 2015-11-14 DIAGNOSIS — N179 Acute kidney failure, unspecified: Secondary | ICD-10-CM | POA: Diagnosis not present

## 2015-11-14 DIAGNOSIS — N184 Chronic kidney disease, stage 4 (severe): Principal | ICD-10-CM

## 2015-11-14 DIAGNOSIS — N058 Unspecified nephritic syndrome with other morphologic changes: Secondary | ICD-10-CM

## 2015-11-14 LAB — CBC
HCT: 30.8 % — ABNORMAL LOW (ref 36.0–46.0)
HEMOGLOBIN: 9.5 g/dL — AB (ref 12.0–15.0)
MCH: 26.5 pg (ref 26.0–34.0)
MCHC: 30.8 g/dL (ref 30.0–36.0)
MCV: 85.8 fL (ref 78.0–100.0)
Platelets: 275 10*3/uL (ref 150–400)
RBC: 3.59 MIL/uL — ABNORMAL LOW (ref 3.87–5.11)
RDW: 16.2 % — ABNORMAL HIGH (ref 11.5–15.5)
WBC: 7.2 10*3/uL (ref 4.0–10.5)

## 2015-11-14 MED ORDER — DARBEPOETIN ALFA 150 MCG/0.3ML IJ SOSY
125.0000 ug | PREFILLED_SYRINGE | Freq: Once | INTRAMUSCULAR | Status: AC
  Administered 2015-11-14: 125 ug via SUBCUTANEOUS
  Filled 2015-11-14: qty 0.3

## 2015-11-14 NOTE — Progress Notes (Signed)
[name] presents today for injection per MD orders. Aranesp 125 mcg administered SQ in right Abdomen. Administration without incident. Patient tolerated well.  

## 2015-11-14 NOTE — Patient Instructions (Signed)
[place] at [facility] Discharge Instructions  RECOMMENDATIONS MADE BY THE CONSULTANT AND ANY TEST RESULTS WILL BE SENT TO YOUR REFERRING PHYSICIAN.  Hemoglobin 9.5.  Aranesp 125 mcg injection given as ordered. Return as scheduled.  Thank you for choosing [place] [facility] [place] at [facility] to provide your oncology and hematology care.  To afford each patient quality time with our provider, please arrive at least 15 minutes before your scheduled appointment time.   Beginning January 23rd 2017 lab work for the [facility] will be done in the  Main lab at [facility] on 1st floor. If you have a lab appointment with the [facility] please come in thru the  Main Entrance and check in at the main information desk  You need to re-schedule your appointment should you arrive 10 or more minutes late.  We strive to give you quality time with our providers, and arriving late affects you and other patients whose appointments are after yours.  Also, if you no show three or more times for appointments you may be dismissed from the clinic at the providers discretion.     Again, thank you for choosing [facility].  Our hope is that these requests will decrease the amount of time that you wait before being seen by our physicians.       _____________________________________________________________  Should you have questions after your visit to [facility], please contact our office at [phone] between the hours of 8:30 a.m. and 4:30 p.m.  Voicemails left after 4:30 p.m. will not be returned until the following business day.  For prescription refill requests, have your pharmacy contact our office.

## 2015-11-16 DIAGNOSIS — N179 Acute kidney failure, unspecified: Secondary | ICD-10-CM | POA: Diagnosis not present

## 2015-11-16 DIAGNOSIS — M329 Systemic lupus erythematosus, unspecified: Secondary | ICD-10-CM | POA: Diagnosis not present

## 2015-11-16 DIAGNOSIS — N184 Chronic kidney disease, stage 4 (severe): Secondary | ICD-10-CM | POA: Diagnosis not present

## 2015-11-16 DIAGNOSIS — N2581 Secondary hyperparathyroidism of renal origin: Secondary | ICD-10-CM | POA: Diagnosis not present

## 2015-11-16 DIAGNOSIS — D631 Anemia in chronic kidney disease: Secondary | ICD-10-CM | POA: Diagnosis not present

## 2015-11-19 ENCOUNTER — Encounter (HOSPITAL_BASED_OUTPATIENT_CLINIC_OR_DEPARTMENT_OTHER): Payer: Medicare Other

## 2015-11-19 ENCOUNTER — Encounter (HOSPITAL_COMMUNITY): Payer: Self-pay

## 2015-11-19 VITALS — BP 159/42 | HR 63 | Temp 98.0°F | Resp 20

## 2015-11-19 DIAGNOSIS — D631 Anemia in chronic kidney disease: Secondary | ICD-10-CM

## 2015-11-19 DIAGNOSIS — N184 Chronic kidney disease, stage 4 (severe): Secondary | ICD-10-CM

## 2015-11-19 DIAGNOSIS — E611 Iron deficiency: Secondary | ICD-10-CM

## 2015-11-19 MED ORDER — SODIUM CHLORIDE 0.9 % IV SOLN
125.0000 mg | Freq: Once | INTRAVENOUS | Status: AC
  Administered 2015-11-19: 125 mg via INTRAVENOUS
  Filled 2015-11-19: qty 10

## 2015-11-19 MED ORDER — SODIUM CHLORIDE 0.9 % IV SOLN
INTRAVENOUS | Status: DC
  Administered 2015-11-19: 14:00:00 via INTRAVENOUS

## 2015-11-19 NOTE — Progress Notes (Signed)
Patient received IV iron today per orders. Patient tolerated it well. 

## 2015-11-19 NOTE — Patient Instructions (Signed)
[place] at [facility] Discharge Instructions  RECOMMENDATIONS MADE BY THE CONSULTANT AND ANY TEST RESULTS WILL BE SENT TO YOUR REFERRING PHYSICIAN.  You received IV iron today. Follow up as scheduled.  Thank you for choosing [place] at [facility] to provide your oncology and hematology care.  To afford each patient quality time with our provider, please arrive at least 15 minutes before your scheduled appointment time.   Beginning January 23rd 2017 lab work for the [facility] will be done in the  Main lab at [facility] on 1st floor. If you have a lab appointment with the [facility] please come in thru the  Main Entrance and check in at the main information desk  You need to re-schedule your appointment should you arrive 10 or more minutes late.  We strive to give you quality time with our providers, and arriving late affects you and other patients whose appointments are after yours.  Also, if you no show three or more times for appointments you may be dismissed from the clinic at the providers discretion.     Again, thank you for choosing [facility].  Our hope is that these requests will decrease the amount of time that you wait before being seen by our physicians.       _____________________________________________________________  Should you have questions after your visit to [facility], please contact our office at [phone] between the hours of 8:30 a.m. and 4:30 p.m.  Voicemails left after 4:30 p.m. will not be returned until the following business day.  For prescription refill requests, have your pharmacy contact our office.

## 2015-11-28 ENCOUNTER — Ambulatory Visit (INDEPENDENT_AMBULATORY_CARE_PROVIDER_SITE_OTHER): Payer: Medicare Other | Admitting: *Deleted

## 2015-11-28 ENCOUNTER — Ambulatory Visit (INDEPENDENT_AMBULATORY_CARE_PROVIDER_SITE_OTHER): Payer: Medicare Other | Admitting: Cardiology

## 2015-11-28 ENCOUNTER — Encounter: Payer: Self-pay | Admitting: Cardiology

## 2015-11-28 VITALS — BP 150/58 | HR 70 | Ht 65.0 in | Wt 213.0 lb

## 2015-11-28 DIAGNOSIS — I872 Venous insufficiency (chronic) (peripheral): Secondary | ICD-10-CM | POA: Diagnosis not present

## 2015-11-28 DIAGNOSIS — I4892 Unspecified atrial flutter: Secondary | ICD-10-CM

## 2015-11-28 DIAGNOSIS — I10 Essential (primary) hypertension: Secondary | ICD-10-CM

## 2015-11-28 DIAGNOSIS — I38 Endocarditis, valve unspecified: Secondary | ICD-10-CM | POA: Diagnosis not present

## 2015-11-28 DIAGNOSIS — Z5181 Encounter for therapeutic drug level monitoring: Secondary | ICD-10-CM | POA: Diagnosis not present

## 2015-11-28 DIAGNOSIS — E1159 Type 2 diabetes mellitus with other circulatory complications: Secondary | ICD-10-CM | POA: Diagnosis not present

## 2015-11-28 LAB — POCT INR: INR: 2.7

## 2015-11-28 NOTE — Progress Notes (Signed)
Cardiology Office Note  Date: 11/28/2015   ID: [name], DOB Oct 13, 1945, MRN [id number]  PCP: [name], MD  Primary Cardiologist: Rozann Lesches, MD   Chief Complaint  Patient presents with  . Atrial Flutter    History of Present Illness: [name] is a 70 y.o. female last seen in September 2016. She is here with her husband today for a routine follow-up visit. Since last encounter she does not recall any specific palpitations or chest pain. Her last ECG from December 2016 is reviewed below showing sinus rhythm. We reviewed her cardiac medications, she continues on Lopressor, Coumadin, Norvasc, Lipitor, and Lasix. The Lasix dose was just increased by her nephrologist in the last few weeks.  She continues on Coumadin with follow-up in the anticoagulation clinic and has had no significant bleeding problems. INR today was 2.7.  Her last echocardiogram was from 2015. I reviewed the report today. We discussed obtaining a follow-up study sometime around her next visit.  Past Medical History  Diagnosis Date  . Asthma   . Essential hypertension, benign   . Type 2 diabetes mellitus ([place])   . SLE (systemic lupus erythematosus) ([place])   . Anemia   . Atrial flutter [facility])     TEE/DCCV December 2013 - on Xarelto [facility])  . CKD (chronic kidney disease) stage 3, GFR 30-59 ml/min   . Bone spur of toe     Right 5th toe  . UTI (lower urinary tract infection)   . DVT of upper extremity (deep vein thrombosis) ([place])     Right basilic vein, June [id number]    Past Surgical History  Procedure Laterality Date  . Ankle surgery  1993  . Back surgery  1980  . Abdominal hysterectomy  1983  . Tee without cardioversion  09/16/2012    Procedure: TRANSESOPHAGEAL ECHOCARDIOGRAM (TEE);  Surgeon: Sanda Klein, MD;  Location: [facility] ENDOSCOPY;  Service: Cardiovascular;  Laterality: N/A;  . Cardioversion  09/16/2012    Procedure: CARDIOVERSION;  Surgeon: Sanda Klein, MD;   Location[facility] ENDOSCOPY;  Service: Cardiovascular;  Laterality: N/A;  . Esophagogastroduodenoscopy  09/20/2012    Dr. [name]: duodenal erosion and possible resolving ulcer at the angularis   . Colonoscopy  09/20/2012    Dr. [name]: multiple tubular adenomas   . Av fistula placement Left 03/27/2014    Procedure: ARTERIOVENOUS (AV) FISTULA CREATION;  Surgeon: Rosetta Posner, MD;  Location: [facility] OR;  Service: Vascular;  Laterality: Left;  . Cardiovascular stress test  12/19/2009    no stress induced rhythm abnormalities, ekg negative for ischemia  . Transesophageal echocardiogram with cardioversion  09/16/2012    EF 60-65%, moderate LVH, moderate regurg of the aortic valve, LA moderately dilated  . Colonoscopy  2005    Dr. [name]. Polyps, path unknown     Current Outpatient Prescriptions  Medication Sig Dispense Refill  . acetaminophen (TYLENOL) 500 MG tablet Take 1,000 mg by mouth every 6 (six) hours as needed. For pain    . albuterol (ACCUNEB) 0.63 MG/3ML nebulizer solution 1 ampule.    Marland Kitchen albuterol (PROVENTIL HFA;VENTOLIN HFA) 108 (90 BASE) MCG/ACT inhaler Inhale 2 puffs into the lungs every 6 (six) hours as needed. For shortness of breath    . amLODipine (NORVASC) 10 MG tablet Take 1 tablet (10 mg total) by mouth at bedtime. 30 tablet 0  . atorvastatin (LIPITOR) 40 MG tablet Take 40 mg by mouth daily. 1/2 tablet 20 mg    . brimonidine-timolol (COMBIGAN) 0.2-0.5 %  ophthalmic solution Apply 1 drop to eye daily.     . calcitRIOL (ROCALTROL) 0.25 MCG capsule Take 0.25 mcg by mouth as directed. Take on Mondays Wednesdays and Fridays    . diclofenac (VOLTAREN) 0.1 % ophthalmic solution Place 1 drop into the left eye daily. Uses at lunch time    . folic acid (FOLVITE) 1 MG tablet Take 1 mg by mouth daily.    . furosemide (LASIX) 80 MG tablet Take 80 mg by mouth 2 (two) times daily. Take 2 in the AM and 2 in the Evening    . HYDROcodone-acetaminophen (NORCO/VICODIN) 5-325 MG per tablet Take 1-2 tablets  by mouth every 4 (four) hours as needed for moderate pain. 10 tablet 0  . hydroxychloroquine (PLAQUENIL) 200 MG tablet Take 200 mg by mouth 2 (two) times daily.    . insulin aspart (NOVOLOG) 100 UNIT/ML injection Inject 5-11 Units into the skin 3 (three) times daily with meals.    . Insulin Detemir (LEVEMIR) 100 UNIT/ML Pen Inject 12 Units into the skin at bedtime. 15 mL 2  . metoprolol (LOPRESSOR) 50 MG tablet TAKE ONE TABLET BY MOUTH TWICE DAILY. 60 tablet 6  . Multiple Vitamins-Minerals (CENTRUM SILVER PO) Take 1 tablet by mouth daily.    . ONE TOUCH ULTRA TEST test strip TEST 4 TIMES A DAY AS DIRECTED. 150 each 5  . ONETOUCH DELICA LANCETS 99991111 MISC USE TO TEST 4 TIMES DAILY. 100 each 5  . polyethylene glycol-electrolytes (NULYTELY/GOLYTELY) 420 g solution Take 4,000 mLs by mouth once. 4000 mL 0  . predniSONE (DELTASONE) 20 MG tablet Take 10 mg by mouth as directed. Pt takes 10 mg and 5mg  alternating days    . travoprost, benzalkonium, (TRAVATAN) 0.004 % ophthalmic solution Place 1 drop into the left eye at bedtime.    . Vitamin D, Ergocalciferol, (DRISDOL) 50000 UNITS CAPS capsule TAKE ONE CAPSULE BY MOUTH ONCE WEEKLY. 12 capsule 0  . warfarin (COUMADIN) 5 MG tablet Take 1 1/2 tablets daily except 1 tablet on Thursdays (Patient taking differently: Take 1 1/2 tablets daily except 1 tablet on Thursdays, Tuesday, and Saturday) 180 tablet 3   No current facility-administered medications for this visit.   Allergies:  Ace inhibitors   Social History: The patient  reports that she quit smoking about 3 years ago. Her smoking use included Cigarettes. She has a 30 pack-year smoking history. She has never used smokeless tobacco. She reports that she does not drink alcohol or use illicit drugs.   ROS:  Please see the history of present illness. Otherwise, complete review of systems is positive for chronic stable dyspnea on exertion.  All other systems are reviewed and negative.   Physical Exam: VS:   BP 150/58 mmHg  Pulse 70  Ht 5\' 5"  (1.651 m)  Wt 213 lb (96.616 kg)  BMI 35.44 kg/m2  SpO2 98%, BMI Body mass index is 35.44 kg/(m^2).  Wt Readings from Last 3 Encounters:  11/28/15 213 lb (96.616 kg)  11/01/15 211 lb 12.8 oz (96.072 kg)  10/10/15 214 lb 6.4 oz (97.251 kg)    Obese woman, appears comfortable at rest.  HEENT: Conjunctiva and lids normal, oropharynx clear.  Neck: Supple, no elevated JVP or carotid bruits, no thyromegaly.  Lungs: Clear to auscultation, nonlabored breathing at rest.  Cardiac: Regular rate and rhythm, no S3, 2/6 systolic murmur with soft diastolic murmur, no pericardial rub.  Abdomen: Soft, nontender, bowel sounds present, no guarding or rebound.  Extremities: 1+ edema, distal pulses  2+.   ECG: I personally reviewed the prior tracing from 09/05/2015 which showed sinus rhythm with LVH, left atrial enlargement, and repolarization abnormalities.  Recent Labwork: 09/19/2015: ALT 12; AST 19; BUN 52*; Creatinine, Ser 2.16*; Potassium 3.8; Sodium 143; TSH 2.360 11/14/2015: Hemoglobin 9.5*; Platelets 275   Other Studies Reviewed Today:  Echocardiogram 03/14/2014: Study Conclusions  - Left ventricle: The cavity size was normal. Wall thickness was increased increased in a pattern of mild to moderate LVH. Systolic function was normal. The estimated ejection fraction was in the range of 50% to 55%. Doppler parameters are consistent with an irreversible restrictive pattern, indicative of decreased left ventricular diastolic compliance and/or increased left atrial pressure (grade 4 diastolic dysfunction). - Aortic valve: Mildly calcified annulus. Trileaflet; mildly thickened leaflets. There was moderate regurgitation. Regurgitation pressure half-time: 324 ms. - Aortic root: The aortic root was normal in size. - Mitral valve: Mildly calcified annulus. Moderately thickened leaflets . There was mild regurgitation. - Left atrium: The atrium  was moderately dilated. - Pulmonary veins: There is systolic blunting of pulmonary vein flow suggestive of elevated LA pressure. - Right ventricle: The cavity size was mildly to moderately dilated. - Right atrium: The atrium was moderately dilated. - Tricuspid valve: There was moderate-severe regurgitation. The jet is eccentric and posterior, this may lead to underestimation of severity. The TR vena contracta measures 0.4 cm. There is systolic reversal of hepatic vein flow suggestive of severe TR. The morphological etiology of the TR is unclear from available images. - Pulmonary arteries: Systolic pressure was moderately increased. PA peak pressure: 61 mm Hg (S). Pulmonary pressure may be underestimated in the setting of significant TR. - Inferior vena cava: The vessel was dilated. The respirophasic diameter changes were blunted (< 50%), consistent with elevated central venous pressure. - Technically adequate study.  Assessment and Plan:  1. History of atrial flutter, no obvious recurrences since TEE cardioversion back in December 2013. Continue on beta blocker and Coumadin.  2. Valvular heart disease including moderate aortic regurgitation and moderate-to-severe tricuspid regurgitation. We will plan a follow-up echocardiogram sometime around her next visit in 6 months.  3. Essential hypertension with chronic kidney disease. She continue to follow with nephrology.  Current medicines were reviewed with the patient today.  Disposition: FU with me in 6 months.   Signed, Satira Sark, MD, [facility] 11/28/2015 1:50 PM    [place] at [facility] [address], [place], [place] [address] Phone: [phone]; Fax: [phone]

## 2015-11-28 NOTE — Patient Instructions (Signed)

## 2015-11-29 ENCOUNTER — Encounter (HOSPITAL_COMMUNITY): Payer: Medicare Other

## 2015-11-29 ENCOUNTER — Encounter (HOSPITAL_COMMUNITY): Payer: Medicare Other | Attending: Hematology & Oncology

## 2015-11-29 VITALS — BP 153/50 | HR 68 | Temp 98.6°F | Resp 20

## 2015-11-29 DIAGNOSIS — D631 Anemia in chronic kidney disease: Secondary | ICD-10-CM | POA: Diagnosis not present

## 2015-11-29 DIAGNOSIS — N058 Unspecified nephritic syndrome with other morphologic changes: Secondary | ICD-10-CM

## 2015-11-29 DIAGNOSIS — N179 Acute kidney failure, unspecified: Secondary | ICD-10-CM | POA: Insufficient documentation

## 2015-11-29 DIAGNOSIS — N184 Chronic kidney disease, stage 4 (severe): Principal | ICD-10-CM

## 2015-11-29 DIAGNOSIS — N189 Chronic kidney disease, unspecified: Secondary | ICD-10-CM | POA: Diagnosis not present

## 2015-11-29 DIAGNOSIS — E538 Deficiency of other specified B group vitamins: Secondary | ICD-10-CM | POA: Insufficient documentation

## 2015-11-29 LAB — CBC
HEMATOCRIT: 30.4 % — AB (ref 36.0–46.0)
Hemoglobin: 9.3 g/dL — ABNORMAL LOW (ref 12.0–15.0)
MCH: 25.9 pg — AB (ref 26.0–34.0)
MCHC: 30.6 g/dL (ref 30.0–36.0)
MCV: 84.7 fL (ref 78.0–100.0)
Platelets: 271 10*3/uL (ref 150–400)
RBC: 3.59 MIL/uL — ABNORMAL LOW (ref 3.87–5.11)
RDW: 16.4 % — AB (ref 11.5–15.5)
WBC: 7.3 10*3/uL (ref 4.0–10.5)

## 2015-11-29 MED ORDER — DARBEPOETIN ALFA 150 MCG/0.3ML IJ SOSY
125.0000 ug | PREFILLED_SYRINGE | Freq: Once | INTRAMUSCULAR | Status: AC
  Administered 2015-11-29: 125 ug via SUBCUTANEOUS

## 2015-11-29 MED ORDER — DARBEPOETIN ALFA 150 MCG/0.3ML IJ SOSY
PREFILLED_SYRINGE | INTRAMUSCULAR | Status: AC
  Filled 2015-11-29: qty 0.3

## 2015-11-29 NOTE — Patient Instructions (Signed)
[place] at [facility] Discharge Instructions  RECOMMENDATIONS MADE BY THE CONSULTANT AND ANY TEST RESULTS WILL BE SENT TO YOUR REFERRING PHYSICIAN.  Hemoglobin 9.3. Aranesp 125 mcg injection given as ordered.  Thank you for choosing [place] at [facility] to provide your oncology and hematology care.  To afford each patient quality time with our provider, please arrive at least 15 minutes before your scheduled appointment time.   Beginning January 23rd 2017 lab work for the [facility] will be done in the  Main lab at [facility] on 1st floor. If you have a lab appointment with the [facility] please come in thru the  Main Entrance and check in at the main information desk  You need to re-schedule your appointment should you arrive 10 or more minutes late.  We strive to give you quality time with our providers, and arriving late affects you and other patients whose appointments are after yours.  Also, if you no show three or more times for appointments you may be dismissed from the clinic at the providers discretion.     Again, thank you for choosing [facility].  Our hope is that these requests will decrease the amount of time that you wait before being seen by our physicians.       _____________________________________________________________  Should you have questions after your visit to [facility], please contact our office at [phone] between the hours of 8:30 a.m. and 4:30 p.m.  Voicemails left after 4:30 p.m. will not be returned until the following business day.  For prescription refill requests, have your pharmacy contact our office.         Resources For Cancer Patients and their Caregivers ? American Cancer Society: Can assist with transportation, wigs, general needs, runs Look Good Feel Better.        (403)086-7386 ? Cancer Care: Provides financial assistance, online support groups,  medication/co-pay assistance.  1-800-813-HOPE (340) 762-6313) ? [place] Assists [place] cancer patients and their families through emotional , educational and financial support.  802-128-3613 ? [place] DSS Where to apply for food stamps, Medicaid and utility assistance. 7372577071 ? [employer]: Transportation to medical appointments. 508-495-3975 ? Social Security Administration: May apply for disability if have a Stage IV cancer. 2725406352 [phone] ? [employer], Disability and Transit Services: Assists with nutrition, care and transit needs. [phone]

## 2015-11-29 NOTE — Progress Notes (Signed)
[name] presents today for injection per MD orders. Aranesp 125 mcg administered SQ in left Abdomen. Administration without incident. Patient tolerated well.  

## 2015-12-06 ENCOUNTER — Ambulatory Visit (HOSPITAL_COMMUNITY): Payer: Medicare Other

## 2015-12-06 DIAGNOSIS — E113212 Type 2 diabetes mellitus with mild nonproliferative diabetic retinopathy with macular edema, left eye: Secondary | ICD-10-CM | POA: Diagnosis not present

## 2015-12-06 DIAGNOSIS — Z961 Presence of intraocular lens: Secondary | ICD-10-CM | POA: Diagnosis not present

## 2015-12-12 DIAGNOSIS — E1129 Type 2 diabetes mellitus with other diabetic kidney complication: Secondary | ICD-10-CM | POA: Diagnosis not present

## 2015-12-12 DIAGNOSIS — I10 Essential (primary) hypertension: Secondary | ICD-10-CM | POA: Diagnosis not present

## 2015-12-12 DIAGNOSIS — E782 Mixed hyperlipidemia: Secondary | ICD-10-CM | POA: Diagnosis not present

## 2015-12-12 DIAGNOSIS — N184 Chronic kidney disease, stage 4 (severe): Secondary | ICD-10-CM | POA: Diagnosis not present

## 2015-12-12 DIAGNOSIS — E119 Type 2 diabetes mellitus without complications: Secondary | ICD-10-CM | POA: Diagnosis not present

## 2015-12-12 DIAGNOSIS — Z1389 Encounter for screening for other disorder: Secondary | ICD-10-CM | POA: Diagnosis not present

## 2015-12-13 ENCOUNTER — Encounter (HOSPITAL_COMMUNITY): Payer: Medicare Other

## 2015-12-13 ENCOUNTER — Encounter (HOSPITAL_BASED_OUTPATIENT_CLINIC_OR_DEPARTMENT_OTHER): Payer: Medicare Other

## 2015-12-13 VITALS — BP 183/50 | HR 75 | Temp 98.2°F | Resp 18

## 2015-12-13 DIAGNOSIS — D631 Anemia in chronic kidney disease: Secondary | ICD-10-CM

## 2015-12-13 DIAGNOSIS — E538 Deficiency of other specified B group vitamins: Secondary | ICD-10-CM | POA: Diagnosis not present

## 2015-12-13 DIAGNOSIS — N189 Chronic kidney disease, unspecified: Secondary | ICD-10-CM

## 2015-12-13 DIAGNOSIS — N184 Chronic kidney disease, stage 4 (severe): Principal | ICD-10-CM

## 2015-12-13 DIAGNOSIS — N058 Unspecified nephritic syndrome with other morphologic changes: Secondary | ICD-10-CM

## 2015-12-13 DIAGNOSIS — N179 Acute kidney failure, unspecified: Secondary | ICD-10-CM | POA: Diagnosis not present

## 2015-12-13 LAB — CBC
HCT: 30.7 % — ABNORMAL LOW (ref 36.0–46.0)
Hemoglobin: 9.4 g/dL — ABNORMAL LOW (ref 12.0–15.0)
MCH: 25.9 pg — AB (ref 26.0–34.0)
MCHC: 30.6 g/dL (ref 30.0–36.0)
MCV: 84.6 fL (ref 78.0–100.0)
PLATELETS: 250 10*3/uL (ref 150–400)
RBC: 3.63 MIL/uL — ABNORMAL LOW (ref 3.87–5.11)
RDW: 16.8 % — ABNORMAL HIGH (ref 11.5–15.5)
WBC: 7.4 10*3/uL (ref 4.0–10.5)

## 2015-12-13 MED ORDER — DARBEPOETIN ALFA 150 MCG/0.3ML IJ SOSY
125.0000 ug | PREFILLED_SYRINGE | Freq: Once | INTRAMUSCULAR | Status: AC
Start: 2015-12-13 — End: 2015-12-13
  Administered 2015-12-13: 125 ug via SUBCUTANEOUS

## 2015-12-13 MED ORDER — DARBEPOETIN ALFA 150 MCG/0.3ML IJ SOSY
PREFILLED_SYRINGE | INTRAMUSCULAR | Status: AC
  Filled 2015-12-13: qty 0.3

## 2015-12-13 NOTE — Patient Instructions (Signed)
[place] at [facility] Discharge Instructions  RECOMMENDATIONS MADE BY THE CONSULTANT AND ANY TEST RESULTS WILL BE SENT TO YOUR REFERRING PHYSICIAN.  Aranesp today.    Thank you for choosing [place] at [facility] to provide your oncology and hematology care.  To afford each patient quality time with our provider, please arrive at least 15 minutes before your scheduled appointment time.   Beginning January 23rd 2017 lab work for the [facility] will be done in the  Main lab at [facility] on 1st floor. If you have a lab appointment with the [facility] please come in thru the  Main Entrance and check in at the main information desk  You need to re-schedule your appointment should you arrive 10 or more minutes late.  We strive to give you quality time with our providers, and arriving late affects you and other patients whose appointments are after yours.  Also, if you no show three or more times for appointments you may be dismissed from the clinic at the providers discretion.     Again, thank you for choosing [facility].  Our hope is that these requests will decrease the amount of time that you wait before being seen by our physicians.       _____________________________________________________________  Should you have questions after your visit to [facility], please contact our office at [phone] between the hours of 8:30 a.m. and 4:30 p.m.  Voicemails left after 4:30 p.m. will not be returned until the following business day.  For prescription refill requests, have your pharmacy contact our office.         Resources For Cancer Patients and their Caregivers ? American Cancer Society: Can assist with transportation, wigs, general needs, runs Look Good Feel Better.        (231)344-3363 ? Cancer Care: Provides financial assistance, online support groups, medication/co-pay assistance.  1-800-813-HOPE  (934)787-2928) ? [place] Assists [place] cancer patients and their families through emotional , educational and financial support.  (405)813-0174 ? [place] DSS Where to apply for food stamps, Medicaid and utility assistance. 828-849-2750 ? [employer]: Transportation to medical appointments. 559-493-7899 ? Social Security Administration: May apply for disability if have a Stage IV cancer. 579 683 5201 331-746-9062 ? [employer], Disability and Transit Services: Assists with nutrition, care and transit needs. [phone]

## 2015-12-13 NOTE — Progress Notes (Signed)
[name] presents today for injection per MD orders. Aranesp 125 mcg administered SQ in right Abdomen. Administration without incident. Patient tolerated well.  

## 2015-12-24 DIAGNOSIS — E1140 Type 2 diabetes mellitus with diabetic neuropathy, unspecified: Secondary | ICD-10-CM | POA: Diagnosis not present

## 2015-12-24 DIAGNOSIS — E1151 Type 2 diabetes mellitus with diabetic peripheral angiopathy without gangrene: Secondary | ICD-10-CM | POA: Diagnosis not present

## 2015-12-24 DIAGNOSIS — L89892 Pressure ulcer of other site, stage 2: Secondary | ICD-10-CM | POA: Diagnosis not present

## 2015-12-27 ENCOUNTER — Encounter (HOSPITAL_BASED_OUTPATIENT_CLINIC_OR_DEPARTMENT_OTHER): Payer: Medicare Other

## 2015-12-27 ENCOUNTER — Other Ambulatory Visit: Payer: Self-pay | Admitting: "Endocrinology

## 2015-12-27 ENCOUNTER — Encounter (HOSPITAL_COMMUNITY): Payer: Medicare Other

## 2015-12-27 VITALS — BP 164/49 | HR 74 | Temp 98.4°F | Resp 20

## 2015-12-27 DIAGNOSIS — D631 Anemia in chronic kidney disease: Secondary | ICD-10-CM

## 2015-12-27 DIAGNOSIS — Z794 Long term (current) use of insulin: Secondary | ICD-10-CM | POA: Diagnosis not present

## 2015-12-27 DIAGNOSIS — N058 Unspecified nephritic syndrome with other morphologic changes: Secondary | ICD-10-CM

## 2015-12-27 DIAGNOSIS — E538 Deficiency of other specified B group vitamins: Secondary | ICD-10-CM | POA: Diagnosis not present

## 2015-12-27 DIAGNOSIS — N189 Chronic kidney disease, unspecified: Secondary | ICD-10-CM

## 2015-12-27 DIAGNOSIS — E1122 Type 2 diabetes mellitus with diabetic chronic kidney disease: Secondary | ICD-10-CM | POA: Diagnosis not present

## 2015-12-27 DIAGNOSIS — N184 Chronic kidney disease, stage 4 (severe): Principal | ICD-10-CM

## 2015-12-27 DIAGNOSIS — N179 Acute kidney failure, unspecified: Secondary | ICD-10-CM | POA: Diagnosis not present

## 2015-12-27 LAB — CBC
HEMATOCRIT: 31 % — AB (ref 36.0–46.0)
HEMOGLOBIN: 9.7 g/dL — AB (ref 12.0–15.0)
MCH: 26.3 pg (ref 26.0–34.0)
MCHC: 31.3 g/dL (ref 30.0–36.0)
MCV: 84 fL (ref 78.0–100.0)
Platelets: 261 10*3/uL (ref 150–400)
RBC: 3.69 MIL/uL — ABNORMAL LOW (ref 3.87–5.11)
RDW: 17.1 % — ABNORMAL HIGH (ref 11.5–15.5)
WBC: 9 10*3/uL (ref 4.0–10.5)

## 2015-12-27 MED ORDER — DARBEPOETIN ALFA 150 MCG/0.3ML IJ SOSY
125.0000 ug | PREFILLED_SYRINGE | Freq: Once | INTRAMUSCULAR | Status: AC
  Administered 2015-12-27: 125 ug via SUBCUTANEOUS
  Filled 2015-12-27: qty 0.3

## 2015-12-27 NOTE — Progress Notes (Signed)
Viann F Kundinger presents today for injection per MD orders. Aranesp 125 mcg administered SQ in right Abdomen. Administration without incident. Patient tolerated well.  

## 2015-12-27 NOTE — Patient Instructions (Signed)
[place] at [facility] Discharge Instructions  RECOMMENDATIONS MADE BY THE CONSULTANT AND ANY TEST RESULTS WILL BE SENT TO YOUR REFERRING PHYSICIAN.  Hemoglobin 9.7. Aranesp 125 mcg injection given as ordered.  Thank you for choosing [place] at [facility] to provide your oncology and hematology care.  To afford each patient quality time with our provider, please arrive at least 15 minutes before your scheduled appointment time.   Beginning January 23rd 2017 lab work for the [facility] will be done in the  Main lab at [facility] on 1st floor. If you have a lab appointment with the [facility] please come in thru the  Main Entrance and check in at the main information desk  You need to re-schedule your appointment should you arrive 10 or more minutes late.  We strive to give you quality time with our providers, and arriving late affects you and other patients whose appointments are after yours.  Also, if you no show three or more times for appointments you may be dismissed from the clinic at the providers discretion.     Again, thank you for choosing [facility].  Our hope is that these requests will decrease the amount of time that you wait before being seen by our physicians.       _____________________________________________________________  Should you have questions after your visit to [facility], please contact our office at [phone] between the hours of 8:30 a.m. and 4:30 p.m.  Voicemails left after 4:30 p.m. will not be returned until the following business day.  For prescription refill requests, have your pharmacy contact our office.         Resources For Cancer Patients and their Caregivers ? American Cancer Society: Can assist with transportation, wigs, general needs, runs Look Good Feel Better.        587-857-8975 ? Cancer Care: Provides financial assistance, online support groups,  medication/co-pay assistance.  1-800-813-HOPE 803-195-3012) ? [place] Assists [place] cancer patients and their families through emotional , educational and financial support.  775-731-3303 ? [place] DSS Where to apply for food stamps, Medicaid and utility assistance. 218-334-0763 ? [employer]: Transportation to medical appointments. 437-061-2382 ? Social Security Administration: May apply for disability if have a Stage IV cancer. 567-881-4328 865-104-7176 ? [employer], Disability and Transit Services: Assists with nutrition, care and transit needs. [phone])692-3927

## 2015-12-28 LAB — BASIC METABOLIC PANEL
BUN / CREAT RATIO: 35 — AB (ref 11–26)
BUN: 79 mg/dL (ref 8–27)
CHLORIDE: 104 mmol/L (ref 96–106)
CO2: 23 mmol/L (ref 18–29)
Calcium: 8.6 mg/dL — ABNORMAL LOW (ref 8.7–10.3)
Creatinine, Ser: 2.27 mg/dL — ABNORMAL HIGH (ref 0.57–1.00)
GFR, EST AFRICAN AMERICAN: 24 mL/min/{1.73_m2} — AB (ref >59)
GFR, EST NON AFRICAN AMERICAN: 21 mL/min/{1.73_m2} — AB (ref >59)
GLUCOSE: 56 mg/dL — AB (ref 65–99)
POTASSIUM: 3.8 mmol/L (ref 3.5–5.2)
SODIUM: 145 mmol/L — AB (ref 134–144)

## 2015-12-28 LAB — HGB A1C W/O EAG: Hgb A1c MFr Bld: 6 % — ABNORMAL HIGH (ref 4.8–5.6)

## 2016-01-03 ENCOUNTER — Ambulatory Visit (INDEPENDENT_AMBULATORY_CARE_PROVIDER_SITE_OTHER): Payer: Medicare Other | Admitting: "Endocrinology

## 2016-01-03 ENCOUNTER — Encounter: Payer: Self-pay | Admitting: "Endocrinology

## 2016-01-03 ENCOUNTER — Encounter: Payer: Medicare Other | Attending: Family Medicine | Admitting: Nutrition

## 2016-01-03 VITALS — Ht 65.0 in | Wt 215.4 lb

## 2016-01-03 VITALS — BP 158/88 | HR 67 | Ht 65.0 in | Wt 215.0 lb

## 2016-01-03 DIAGNOSIS — Z794 Long term (current) use of insulin: Secondary | ICD-10-CM | POA: Diagnosis not present

## 2016-01-03 DIAGNOSIS — Z029 Encounter for administrative examinations, unspecified: Secondary | ICD-10-CM | POA: Diagnosis not present

## 2016-01-03 DIAGNOSIS — E785 Hyperlipidemia, unspecified: Secondary | ICD-10-CM | POA: Diagnosis not present

## 2016-01-03 DIAGNOSIS — E1122 Type 2 diabetes mellitus with diabetic chronic kidney disease: Secondary | ICD-10-CM

## 2016-01-03 DIAGNOSIS — I10 Essential (primary) hypertension: Secondary | ICD-10-CM

## 2016-01-03 DIAGNOSIS — N184 Chronic kidney disease, stage 4 (severe): Secondary | ICD-10-CM

## 2016-01-03 DIAGNOSIS — E669 Obesity, unspecified: Secondary | ICD-10-CM

## 2016-01-03 NOTE — Progress Notes (Signed)
Subjective:    Patient ID: [name], female    DOB: [birth year],    Past Medical History  Diagnosis Date  . Asthma   . Essential hypertension, benign   . Type 2 diabetes mellitus ([place])   . SLE (systemic lupus erythematosus) ([place])   . Anemia   . Atrial flutter [facility])     TEE/DCCV December 2013 - on Xarelto [facility])  . CKD (chronic Little disease) stage 3, GFR 30-59 ml/min   . Bone spur of toe     Right 5th toe  . UTI (lower urinary tract infection)   . DVT of upper extremity (deep vein thrombosis) ([place])     Right basilic vein, June [id number]   Past Surgical History  Procedure Laterality Date  . Ankle surgery  1993  . Back surgery  1980  . Abdominal hysterectomy  1983  . Tee without cardioversion  09/16/2012    Procedure: TRANSESOPHAGEAL ECHOCARDIOGRAM (TEE);  Surgeon: Sanda Klein, MD;  Location: [facility] ENDOSCOPY;  Service: Cardiovascular;  Laterality: N/A;  . Cardioversion  09/16/2012    Procedure: CARDIOVERSION;  Surgeon: Sanda Klein, MD;  Location[facility] ENDOSCOPY;  Service: Cardiovascular;  Laterality: N/A;  . Esophagogastroduodenoscopy  09/20/2012    Dr. [name]: duodenal erosion and possible resolving ulcer at the angularis   . Colonoscopy  09/20/2012    Dr. [name]: multiple tubular adenomas   . Av fistula placement Left 03/27/2014    Procedure: ARTERIOVENOUS (AV) FISTULA CREATION;  Surgeon: Rosetta Posner, MD;  Location: [facility] OR;  Service: Vascular;  Laterality: Left;  . Cardiovascular stress test  12/19/2009    no stress induced rhythm abnormalities, ekg negative for ischemia  . Transesophageal echocardiogram with cardioversion  09/16/2012    EF 60-65%, moderate LVH, moderate regurg of the aortic valve, LA moderately dilated  . Colonoscopy  2005    Dr. [name]. Polyps, path unknown    Social History   Social History  . Marital Status: Married    Spouse Name: N/A  . Number of Children: N/A  . Years of Education: N/A   Social History Main Topics  . Smoking  status: Former Smoker -- 1.00 packs/day for 30 years    Types: Cigarettes    Quit date: 08/29/2012  . Smokeless tobacco: Never Used     Comment: quit 08-2012  . Alcohol Use: No  . Drug Use: No  . Sexual Activity: Yes   Other Topics Concern  . None   Social History Narrative   Outpatient Encounter Prescriptions as of 01/03/2016  Medication Sig  . acetaminophen (TYLENOL) 500 MG tablet Take 1,000 mg by mouth every 6 (six) hours as needed. For pain  . albuterol (ACCUNEB) 0.63 MG/3ML nebulizer solution 1 ampule.  Marland Kitchen albuterol (PROVENTIL HFA;VENTOLIN HFA) 108 (90 BASE) MCG/ACT inhaler Inhale 2 puffs into the lungs every 6 (six) hours as needed. For shortness of breath  . amLODipine (NORVASC) 10 MG tablet Take 1 tablet (10 mg total) by mouth at bedtime.  Marland Kitchen atorvastatin (LIPITOR) 40 MG tablet Take 40 mg by mouth daily. 1/2 tablet 20 mg  . brimonidine-timolol (COMBIGAN) 0.2-0.5 % ophthalmic solution Apply 1 drop to eye daily.   . calcitRIOL (ROCALTROL) 0.25 MCG capsule Take 0.25 mcg by mouth as directed. Take on Mondays Wednesdays and Fridays  . diclofenac (VOLTAREN) 0.1 % ophthalmic solution Place 1 drop into the left eye daily. Uses at lunch time  . folic acid (FOLVITE) 1 MG tablet Take 1 mg by mouth daily.  Marland Kitchen  furosemide (LASIX) 80 MG tablet Take 80 mg by mouth 2 (two) times daily. Take 2 in the AM and 2 in the Evening  . HYDROcodone-acetaminophen (NORCO/VICODIN) 5-325 MG per tablet Take 1-2 tablets by mouth every 4 (four) hours as needed for moderate pain.  . hydroxychloroquine (PLAQUENIL) 200 MG tablet Take 200 mg by mouth 2 (two) times daily.  . insulin aspart (NOVOLOG) 100 UNIT/ML injection Inject 5-11 Units into the skin 3 (three) times daily with meals.  . Insulin Detemir (LEVEMIR) 100 UNIT/ML Pen Inject 12 Units into the skin at bedtime.  . metoprolol (LOPRESSOR) 50 MG tablet TAKE ONE TABLET BY MOUTH TWICE DAILY.  . Multiple Vitamins-Minerals (CENTRUM SILVER PO) Take 1 tablet by mouth  daily.  . ONE TOUCH ULTRA TEST test strip TEST 4 TIMES A DAY AS DIRECTED.  Marland Kitchen ONETOUCH DELICA LANCETS 99991111 MISC USE TO TEST 4 TIMES DAILY.  Marland Kitchen polyethylene glycol-electrolytes (NULYTELY/GOLYTELY) 420 g solution Take 4,000 mLs by mouth once.  . predniSONE (DELTASONE) 20 MG tablet Take 10 mg by mouth as directed. Pt takes 10 mg and 5mg  alternating days  . Vitamin D, Ergocalciferol, (DRISDOL) 50000 UNITS CAPS capsule TAKE ONE CAPSULE BY MOUTH ONCE WEEKLY.  . warfarin (COUMADIN) 5 MG tablet Take 1 1/2 tablets daily except 1 tablet on Thursdays (Patient taking differently: Take 1 1/2 tablets daily except 1 tablet on Thursdays, Tuesday, and Saturday)  . [DISCONTINUED] travoprost, benzalkonium, (TRAVATAN) 0.004 % ophthalmic solution Place 1 drop into the left eye at bedtime.   No facility-administered encounter medications on file as of 01/03/2016.   ALLERGIES: Allergies  Allergen Reactions  . Ace Inhibitors Cough   VACCINATION STATUS: Immunization History  Administered Date(s) Administered  . Influenza,inj,Quad PF,36+ Mos 07/03/2014  . Influenza-Unspecified 07/11/2015    Diabetes She presents for her follow-up diabetic visit. She has type 2 diabetes mellitus. Onset time: diagnosed at approx age of 74. Her disease course has been improving. Pertinent negatives for hypoglycemia include no confusion, headaches, pallor or seizures. Associated symptoms include fatigue. Pertinent negatives for diabetes include no chest pain, no polydipsia, no polyphagia and no polyuria. Symptoms are improving. Diabetic complications include nephropathy. Risk factors for coronary artery disease include dyslipidemia, diabetes mellitus and obesity. She is compliant with treatment most of the time. Her weight is stable. She is following a generally unhealthy diet. She has had a previous visit with a dietitian. She never participates in exercise. Her home blood glucose trend is decreasing steadily (Her A1c has improved to 6%, no  hypoglycemia. Her EAG is between 100- 150.). Her breakfast blood glucose range is generally 140-180 mg/dl. Her lunch blood glucose range is generally 140-180 mg/dl. Her dinner blood glucose range is generally 140-180 mg/dl. Her overall blood glucose range is 140-180 mg/dl. An ACE inhibitor/angiotensin II receptor blocker is being taken.  Hypertension Pertinent negatives include no chest pain, headaches, palpitations or shortness of breath.  Hyperlipidemia Pertinent negatives include no chest pain, myalgias or shortness of breath. Current antihyperlipidemic treatment includes statins.     Review of Systems  Constitutional: Positive for fatigue. Negative for unexpected weight change.  HENT: Negative for trouble swallowing and voice change.   Eyes: Negative for visual disturbance.  Respiratory: Negative for cough, shortness of breath and wheezing.   Cardiovascular: Negative for chest pain, palpitations and leg swelling.  Gastrointestinal: Negative for nausea, vomiting and diarrhea.  Endocrine: Negative for cold intolerance, heat intolerance, polydipsia, polyphagia and polyuria.  Musculoskeletal: Negative for myalgias and arthralgias.  Skin: Negative for  color change, pallor, rash and wound.  Neurological: Negative for seizures and headaches.  Psychiatric/Behavioral: Negative for suicidal ideas and confusion.    Objective:    BP 158/88 mmHg  Pulse 67  Ht 5\' 5"  (1.651 m)  Wt 215 lb (97.523 kg)  BMI 35.78 kg/m2  SpO2 98%  Wt Readings from Last 3 Encounters:  01/03/16 215 lb (97.523 kg)  01/03/16 215 lb 6.4 oz (97.705 kg)  11/28/15 213 lb (96.616 kg)    Physical Exam  Constitutional: She is oriented to person, place, and time. She appears well-developed.  HENT:  Head: Normocephalic and atraumatic.  Eyes: EOM are normal.  Neck: Normal range of motion. Neck supple. No tracheal deviation present. No thyromegaly present.  Cardiovascular: Normal rate and regular rhythm.    Pulmonary/Chest: Effort normal and breath sounds normal.  Abdominal: Soft. Bowel sounds are normal. There is no tenderness. There is no guarding.  Musculoskeletal: Normal range of motion. She exhibits no edema.  Neurological: She is alert and oriented to person, place, and time. She has normal reflexes. No cranial nerve deficit. Coordination normal.  Skin: Skin is warm and dry. No rash noted. No erythema. No pallor.  Psychiatric: She has a normal mood and affect. Judgment normal.    Chemistry (most recent): Lab Results  Component Value Date   NA 145* 12/27/2015   K 3.8 12/27/2015   CL 104 12/27/2015   CO2 23 12/27/2015   BUN 79* 12/27/2015   CREATININE 2.27* 12/27/2015   Diabetic Labs (most recent): Lab Results  Component Value Date   HGBA1C 6.0* 12/27/2015   HGBA1C 6.1* 09/19/2015   HGBA1C 6.7* 03/13/2015     Assessment & Plan:   1. Type 2 diabetes mellitus with stage 4 chronic Little disease Patient is advised to stick to a routine mealtimes to eat 3 meals  a day and avoid unnecessary snacks ( to snack only to correct hypoglycemia). -Her A1c is 6% in disagreement with her estimated average blood glucose of 100-150 likely due to mild anemia with hemoglobin of 9. Patient is advised to eliminate simple carbs  from their diet including cakes desserts ice cream soda (  diet and regular) , sweet tea , Candies,  chips and cookies, artificial sweeteners,   and "sugar-free" products .  This will help patient to have stable blood glucose profile and potentially lose weight. Patient is given detailed personalized glucose monitoring and insulin dosing instructions. Patient is instructed to call back with extremes of blood glucose less than 70 or greater than 300. Patient to bring meter and  blood glucose logs during their next visit.  I will continue Levemir   12 units qhs, continue novolog 5 units TIDAC plus correction. I will request for repeat of the following labs before next  visit. -She has a chance to come off of NovoLog only if she is willing to avoid concentrated carbohydrates such as soda.  2. Essential hypertension, benign Controlled. Continue ARB.  3. Hyperlipemia Continue atorvastatatin.   4. Morbid obesity Carbs advise given. See above #1. 5. vitamin D deficiency.  -Continue current Vitamin D supplement. Will obtain : - Vit D  25 hydroxy (rtn osteoporosis monitoring)   Follow up plan: Return in about 3 months (around 04/03/2016) for diabetes, high blood pressure, follow up with pre-visit labs, meter, and logs.  Glade Lloyd, MD Phone: [phone]  Fax: [phone]   01/03/2016, 11:54 AM

## 2016-01-03 NOTE — Patient Instructions (Signed)

## 2016-01-03 NOTE — Progress Notes (Signed)
  Medical Nutrition Therapy:  Appt start time: 1100 end time:  1130  Assessment:  Primary concerns today: DIabetes Follow up and CKD. Sees Dr. Dorris Fetch today.  Here with her husband today. A1C improved from 6.1% down to 6.0%.    12 units of [place] and 5 units of Novolog with meals plus sliding scales. She is following her diet. Wt is stable at 215 lbs. Needs to work on weight loss Working on walking more. Doesn't need to restrict potassium or phosphorus right now per her kidney doctor.   Lab Results  Component Value Date   HGBA1C 6.0* 12/27/2015   Rreferred Learning Style:   Auditory  Visual  Hands on  No preference indicated   Learning Readiness:   Ready  Change in progress  MEDICATIONS: See list   DIETARY INTAKE:  24-hr recall:  B ( AM): Oatmeal and 1 slice toast and  Coffee Snk ( AM):  L ( PM): Roast beef sandwich on whole bread,  Snk ( PM):  D ( PM):  Chicken, baked, turnip greens, water. Beverages: water  Usual physical activity: limited  Estimated energy needs: 1200 calories 135 g carbohydrates 90 g protein 33 g fat  Progress Towards Goal(s):  In progress.   Nutritional Diagnosis:  NB-1.1 Food and nutrition-related knowledge deficit As related to Diabetes.  As evidenced by A1C 7.3%.    Intervention:  Nutrition counseling and diabetes education on diet, exercise, medications and complications. Plan:  1. Walk 15 -30 minutes a day. 2. Only use Mrs. Dash and other herbs and spices to cook with 3. Increase low carb vegetables to 2 servings with lunch and dinner. 4. Eat 2-3 carb choices per meal to prevent low blood sugars between meals. 6. Keep up the [place]!! 7. Lose 2 lbs per month.  Teaching Method Utilized:  Visual Auditory Hands on  Handouts given during visit include: The Plate Method   Barriers to learning/adherence to lifestyle change: none  Demonstrated degree of understanding via:  Teach Back   Monitoring/Evaluation:  Dietary intake,  exercise, meal planning, SBG, and body weight in  3 month(s).

## 2016-01-03 NOTE — Patient Instructions (Addendum)
Plan:  1. Walk 15 minutes a day. 2. Only use Mrs. Dash and other herbs and spices to cook with 3. Increase low carb vegetables to 2 servings with lunch and dinner. 4. Eat 2-3 carb choices per meal to prevent low blood sugars between meals. Marland Kitchen Keep up the [place]!! 7. Lose 2 lbs per month.

## 2016-01-07 DIAGNOSIS — M7061 Trochanteric bursitis, right hip: Secondary | ICD-10-CM | POA: Diagnosis not present

## 2016-01-07 DIAGNOSIS — Z1389 Encounter for screening for other disorder: Secondary | ICD-10-CM | POA: Diagnosis not present

## 2016-01-07 DIAGNOSIS — I4891 Unspecified atrial fibrillation: Secondary | ICD-10-CM | POA: Diagnosis not present

## 2016-01-07 DIAGNOSIS — I5032 Chronic diastolic (congestive) heart failure: Secondary | ICD-10-CM | POA: Diagnosis not present

## 2016-01-07 DIAGNOSIS — E1129 Type 2 diabetes mellitus with other diabetic kidney complication: Secondary | ICD-10-CM | POA: Diagnosis not present

## 2016-01-09 ENCOUNTER — Ambulatory Visit (INDEPENDENT_AMBULATORY_CARE_PROVIDER_SITE_OTHER): Payer: Medicare Other | Admitting: *Deleted

## 2016-01-09 DIAGNOSIS — Z5181 Encounter for therapeutic drug level monitoring: Secondary | ICD-10-CM | POA: Diagnosis not present

## 2016-01-09 DIAGNOSIS — I4892 Unspecified atrial flutter: Secondary | ICD-10-CM | POA: Diagnosis not present

## 2016-01-09 LAB — POCT INR: INR: 2.3

## 2016-01-10 ENCOUNTER — Encounter (HOSPITAL_COMMUNITY): Payer: Self-pay

## 2016-01-10 ENCOUNTER — Encounter (HOSPITAL_COMMUNITY): Payer: Medicare Other | Attending: Hematology & Oncology

## 2016-01-10 ENCOUNTER — Encounter (HOSPITAL_COMMUNITY): Payer: Medicare Other

## 2016-01-10 VITALS — BP 150/33 | HR 54 | Temp 97.6°F | Resp 20

## 2016-01-10 DIAGNOSIS — N179 Acute kidney failure, unspecified: Secondary | ICD-10-CM | POA: Diagnosis not present

## 2016-01-10 DIAGNOSIS — D631 Anemia in chronic kidney disease: Secondary | ICD-10-CM

## 2016-01-10 DIAGNOSIS — N189 Chronic kidney disease, unspecified: Secondary | ICD-10-CM | POA: Diagnosis not present

## 2016-01-10 DIAGNOSIS — N184 Chronic kidney disease, stage 4 (severe): Principal | ICD-10-CM

## 2016-01-10 DIAGNOSIS — N058 Unspecified nephritic syndrome with other morphologic changes: Secondary | ICD-10-CM

## 2016-01-10 DIAGNOSIS — E538 Deficiency of other specified B group vitamins: Secondary | ICD-10-CM | POA: Insufficient documentation

## 2016-01-10 LAB — CBC
HCT: 31.1 % — ABNORMAL LOW (ref 36.0–46.0)
Hemoglobin: 9.9 g/dL — ABNORMAL LOW (ref 12.0–15.0)
MCH: 26.8 pg (ref 26.0–34.0)
MCHC: 31.8 g/dL (ref 30.0–36.0)
MCV: 84.3 fL (ref 78.0–100.0)
PLATELETS: 223 10*3/uL (ref 150–400)
RBC: 3.69 MIL/uL — ABNORMAL LOW (ref 3.87–5.11)
RDW: 16.6 % — AB (ref 11.5–15.5)
WBC: 7.5 10*3/uL (ref 4.0–10.5)

## 2016-01-10 MED ORDER — DARBEPOETIN ALFA 150 MCG/0.3ML IJ SOSY
PREFILLED_SYRINGE | INTRAMUSCULAR | Status: AC
  Filled 2016-01-10: qty 0.3

## 2016-01-10 MED ORDER — DARBEPOETIN ALFA 150 MCG/0.3ML IJ SOSY
125.0000 ug | PREFILLED_SYRINGE | Freq: Once | INTRAMUSCULAR | Status: AC
  Administered 2016-01-10: 125 ug via SUBCUTANEOUS

## 2016-01-10 NOTE — Progress Notes (Signed)
[name] presents today for injection per the provider's orders.  aranesp administration without incident; see MAR for injection details.  Patient tolerated procedure well and without incident.  No questions or complaints noted at this time.

## 2016-01-10 NOTE — Patient Instructions (Signed)
[place] at [facility] Discharge Instructions  RECOMMENDATIONS MADE BY THE CONSULTANT AND ANY TEST RESULTS WILL BE SENT TO YOUR REFERRING PHYSICIAN.  Hemoglobin 9.9. Aranesp 125 mcg injection given as ordered. Return as scheduled.  Thank you for choosing [place] at [facility] to provide your oncology and hematology care.  To afford each patient quality time with our provider, please arrive at least 15 minutes before your scheduled appointment time.   Beginning January 23rd 2017 lab work for the [facility] will be done in the  Main lab at [facility] on 1st floor. If you have a lab appointment with the [facility] please come in thru the  Main Entrance and check in at the main information desk  You need to re-schedule your appointment should you arrive 10 or more minutes late.  We strive to give you quality time with our providers, and arriving late affects you and other patients whose appointments are after yours.  Also, if you no show three or more times for appointments you may be dismissed from the clinic at the providers discretion.     Again, thank you for choosing [facility].  Our hope is that these requests will decrease the amount of time that you wait before being seen by our physicians.       _____________________________________________________________  Should you have questions after your visit to [facility], please contact our office at [phone] between the hours of 8:30 a.m. and 4:30 p.m.  Voicemails left after 4:30 p.m. will not be returned until the following business day.  For prescription refill requests, have your pharmacy contact our office.         Resources For Cancer Patients and their Caregivers ? American Cancer Society: Can assist with transportation, wigs, general needs, runs Look Good Feel Better.        947-159-3427 ? Cancer Care: Provides financial assistance,  online support groups, medication/co-pay assistance.  1-800-813-HOPE 917-639-3580) ? [place] Assists [place] cancer patients and their families through emotional , educational and financial support.  (816)853-9927 ? [place] DSS Where to apply for food stamps, Medicaid and utility assistance. 386-120-7525 ? [employer]: Transportation to medical appointments. 917-033-3272 ? Social Security Administration: May apply for disability if have a Stage IV cancer. 479-730-9990 561-765-2508 ? [employer], Disability and Transit Services: Assists with nutrition, care and transit needs. ([phone]

## 2016-01-15 DIAGNOSIS — E113493 Type 2 diabetes mellitus with severe nonproliferative diabetic retinopathy without macular edema, bilateral: Secondary | ICD-10-CM | POA: Diagnosis not present

## 2016-01-15 DIAGNOSIS — E11311 Type 2 diabetes mellitus with unspecified diabetic retinopathy with macular edema: Secondary | ICD-10-CM | POA: Diagnosis not present

## 2016-01-15 DIAGNOSIS — H401111 Primary open-angle glaucoma, right eye, mild stage: Secondary | ICD-10-CM | POA: Diagnosis not present

## 2016-01-15 DIAGNOSIS — E1129 Type 2 diabetes mellitus with other diabetic kidney complication: Secondary | ICD-10-CM | POA: Diagnosis not present

## 2016-01-15 DIAGNOSIS — H401123 Primary open-angle glaucoma, left eye, severe stage: Secondary | ICD-10-CM | POA: Diagnosis not present

## 2016-01-23 ENCOUNTER — Encounter (HOSPITAL_BASED_OUTPATIENT_CLINIC_OR_DEPARTMENT_OTHER): Payer: Medicare Other

## 2016-01-23 ENCOUNTER — Encounter (HOSPITAL_COMMUNITY): Payer: Medicare Other

## 2016-01-23 ENCOUNTER — Other Ambulatory Visit: Payer: Self-pay

## 2016-01-23 VITALS — BP 133/58 | HR 61 | Temp 98.0°F | Resp 18

## 2016-01-23 DIAGNOSIS — N184 Chronic kidney disease, stage 4 (severe): Principal | ICD-10-CM

## 2016-01-23 DIAGNOSIS — D631 Anemia in chronic kidney disease: Secondary | ICD-10-CM | POA: Diagnosis not present

## 2016-01-23 DIAGNOSIS — N189 Chronic kidney disease, unspecified: Secondary | ICD-10-CM | POA: Diagnosis not present

## 2016-01-23 DIAGNOSIS — N179 Acute kidney failure, unspecified: Secondary | ICD-10-CM | POA: Diagnosis not present

## 2016-01-23 DIAGNOSIS — N058 Unspecified nephritic syndrome with other morphologic changes: Secondary | ICD-10-CM

## 2016-01-23 DIAGNOSIS — E538 Deficiency of other specified B group vitamins: Secondary | ICD-10-CM | POA: Diagnosis not present

## 2016-01-23 LAB — CBC
HCT: 31.2 % — ABNORMAL LOW (ref 36.0–46.0)
Hemoglobin: 9.8 g/dL — ABNORMAL LOW (ref 12.0–15.0)
MCH: 26.3 pg (ref 26.0–34.0)
MCHC: 31.4 g/dL (ref 30.0–36.0)
MCV: 83.9 fL (ref 78.0–100.0)
PLATELETS: 216 10*3/uL (ref 150–400)
RBC: 3.72 MIL/uL — ABNORMAL LOW (ref 3.87–5.11)
RDW: 16.7 % — AB (ref 11.5–15.5)
WBC: 6.3 10*3/uL (ref 4.0–10.5)

## 2016-01-23 MED ORDER — DARBEPOETIN ALFA 150 MCG/0.3ML IJ SOSY
125.0000 ug | PREFILLED_SYRINGE | Freq: Once | INTRAMUSCULAR | Status: AC
  Administered 2016-01-23: 125 ug via SUBCUTANEOUS

## 2016-01-23 MED ORDER — INSULIN LISPRO 100 UNIT/ML (KWIKPEN): PEN_INJECTOR | SUBCUTANEOUS | Status: DC

## 2016-01-23 MED ORDER — DARBEPOETIN ALFA 150 MCG/0.3ML IJ SOSY
PREFILLED_SYRINGE | INTRAMUSCULAR | Status: AC
  Filled 2016-01-23: qty 0.3

## 2016-01-23 MED ORDER — VITAMIN D (ERGOCALCIFEROL) 1.25 MG (50000 UNIT) PO CAPS: ORAL_CAPSULE | ORAL | Status: DC

## 2016-01-23 NOTE — Progress Notes (Signed)
[name] presents today for injection per MD orders. Aranesp 125 mcg administered SQ in right Abdomen. Administration without incident. Patient tolerated well.  

## 2016-01-23 NOTE — Patient Instructions (Signed)
[place] at [facility] Discharge Instructions  RECOMMENDATIONS MADE BY THE CONSULTANT AND ANY TEST RESULTS WILL BE SENT TO YOUR REFERRING PHYSICIAN.  Aranesp today.    Thank you for choosing [place] at [facility] to provide your oncology and hematology care.  To afford each patient quality time with our provider, please arrive at least 15 minutes before your scheduled appointment time.   Beginning January 23rd 2017 lab work for the [facility] will be done in the  Main lab at [facility] on 1st floor. If you have a lab appointment with the [facility] please come in thru the  Main Entrance and check in at the main information desk  You need to re-schedule your appointment should you arrive 10 or more minutes late.  We strive to give you quality time with our providers, and arriving late affects you and other patients whose appointments are after yours.  Also, if you no show three or more times for appointments you may be dismissed from the clinic at the providers discretion.     Again, thank you for choosing [facility].  Our hope is that these requests will decrease the amount of time that you wait before being seen by our physicians.       _____________________________________________________________  Should you have questions after your visit to [facility], please contact our office at [phone] between the hours of 8:30 a.m. and 4:30 p.m.  Voicemails left after 4:30 p.m. will not be returned until the following business day.  For prescription refill requests, have your pharmacy contact our office.         Resources For Cancer Patients and their Caregivers ? American Cancer Society: Can assist with transportation, wigs, general needs, runs Look Good Feel Better.        867-337-4754 ? Cancer Care: Provides financial assistance, online support groups, medication/co-pay assistance.  1-800-813-HOPE  323-740-1350) ? [place] Assists [place] cancer patients and their families through emotional , educational and financial support.  872-132-5270 ? [place] DSS Where to apply for food stamps, Medicaid and utility assistance. 570-238-7960 ? [employer]ATS: Transportation to medical appointments. (629)726-5812 ? Social Security Administration: May apply for disability if have a Stage IV cancer. (989)859-1995 6622463950 ? [employer], Disability and Transit Services: Assists with nutrition, care and transit needs. ([phone]

## 2016-01-25 ENCOUNTER — Ambulatory Visit (HOSPITAL_COMMUNITY): Payer: Medicare Other

## 2016-01-25 ENCOUNTER — Other Ambulatory Visit (HOSPITAL_COMMUNITY): Payer: Medicare Other

## 2016-01-25 ENCOUNTER — Ambulatory Visit (HOSPITAL_COMMUNITY): Payer: Medicare Other | Admitting: Oncology

## 2016-01-25 DIAGNOSIS — M329 Systemic lupus erythematosus, unspecified: Secondary | ICD-10-CM | POA: Diagnosis not present

## 2016-01-25 DIAGNOSIS — N189 Chronic kidney disease, unspecified: Secondary | ICD-10-CM | POA: Diagnosis not present

## 2016-01-25 DIAGNOSIS — M810 Age-related osteoporosis without current pathological fracture: Secondary | ICD-10-CM | POA: Diagnosis not present

## 2016-01-25 DIAGNOSIS — M1990 Unspecified osteoarthritis, unspecified site: Secondary | ICD-10-CM | POA: Diagnosis not present

## 2016-01-31 DIAGNOSIS — Z961 Presence of intraocular lens: Secondary | ICD-10-CM | POA: Diagnosis not present

## 2016-01-31 DIAGNOSIS — E11311 Type 2 diabetes mellitus with unspecified diabetic retinopathy with macular edema: Secondary | ICD-10-CM | POA: Diagnosis not present

## 2016-01-31 DIAGNOSIS — E113213 Type 2 diabetes mellitus with mild nonproliferative diabetic retinopathy with macular edema, bilateral: Secondary | ICD-10-CM | POA: Diagnosis not present

## 2016-02-04 DIAGNOSIS — E1140 Type 2 diabetes mellitus with diabetic neuropathy, unspecified: Secondary | ICD-10-CM | POA: Diagnosis not present

## 2016-02-04 DIAGNOSIS — M79672 Pain in left foot: Secondary | ICD-10-CM | POA: Diagnosis not present

## 2016-02-04 DIAGNOSIS — L89892 Pressure ulcer of other site, stage 2: Secondary | ICD-10-CM | POA: Diagnosis not present

## 2016-02-04 DIAGNOSIS — E1151 Type 2 diabetes mellitus with diabetic peripheral angiopathy without gangrene: Secondary | ICD-10-CM | POA: Diagnosis not present

## 2016-02-04 DIAGNOSIS — M25579 Pain in unspecified ankle and joints of unspecified foot: Secondary | ICD-10-CM | POA: Diagnosis not present

## 2016-02-05 NOTE — Assessment & Plan Note (Addendum)
Anemia of chronic renal disease with an anemia of chronic disease/inflammation with FSG in the setting of SLE (followed by Dr. Dossie Der).  Labs every 2 weeks: CBC   Additional labs today: BMET, and ferritin.  Continue Aranesp 125 mcg every 2 weeks.  Aranesp to be given today.  She sees Dr. Dossie Der, Rheumatology.  She is currently on Plaquenil and her Prednisone dose has been decreased to 5 mg daily by Dr. Dossie Der.    She cancelled her colonoscopy because "there was too much going on."  She did have positive stool cards and thus, the referral to GI for colonoscopy.  She and her husband think that a colonoscopy given her other medical issues will be too difficult.  Return in 12 weeks for follow-up.

## 2016-02-05 NOTE — Progress Notes (Signed)
[name], MD [place] [id number]  Anemia of chronic renal failure, stage 4 (severe) ([place]) - Plan: Basic metabolic panel, Ferritin, Basic metabolic panel, Ferritin  Focal segmental glomerulosclerosis without nephrosis or chronic glomerulonephritis - Plan: Darbepoetin Alfa (ARANESP) injection 125 mcg  Anemia in chronic renal disease - Plan: Darbepoetin Alfa (ARANESP) injection 125 mcg  CURRENT THERAPY: Aranesp 125 mcg every 2 weeks beginning on 09/05/2015 after being on Aranesp 100 mcg beginning on 05/16/2015. IV iron as needed.   INTERVAL HISTORY: [name] 70 y.o. female returns for followup of chronic renal disease in the setting of SLE (followed by Dr. 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 No bone marrow aspiration and biopsy documented.  I personally reviewed and went over laboratory results with the patient.  The results are noted within this dictation.  Her HGB is 10.7 g/dL which is the best HGB we have documented going back to 2008.  She was set-up for a colonoscopy and they cancelled this because there was "too much going on." She did have positive stool cards and thus, the referral to GI for colonoscopy. She and her husband think that a colonoscopy given her other medical issues will be too difficult.  She notes that her Rheumatologist has decreased her Prednisone dose to 5 mg daily.  She remains on Plaquenil.   She notes that she feels well and denies any complaints.  Review of Systems - General ROS: negative for - chills, malaise, weight gain or weight loss ENT ROS: negative for - epistaxis, nasal congestion or nasal discharge Hematological and Lymphatic ROS: negative for - bleeding problems or bruising Respiratory ROS: no cough, shortness of breath, or wheezing Cardiovascular ROS: no chest  pain or dyspnea on exertion Gastrointestinal ROS: no abdominal pain, change in bowel habits, or black or bloody stools Genito-Urinary ROS: no dysuria, trouble voiding, or hematuria Musculoskeletal ROS: negative for - joint swelling or muscle pain  Past Medical History  Diagnosis Date  . Asthma   . Essential hypertension, benign   . Type 2 diabetes mellitus ([place])   . SLE (systemic lupus erythematosus) ([place])   . Anemia   . Atrial flutter [facility])     TEE/DCCV December 2013 - on Xarelto [facility])  . CKD (chronic kidney disease) stage 3, GFR 30-59 ml/min   . Bone spur of toe     Right 5th toe  . UTI (lower urinary tract infection)   . DVT of upper extremity (deep vein thrombosis) ([place])     Right basilic vein, June [id number]    Past Surgical History  Procedure Laterality Date  . Ankle surgery  1993  . Back surgery  1980  . Abdominal hysterectomy  1983  . Tee without cardioversion  09/16/2012    Procedure: TRANSESOPHAGEAL ECHOCARDIOGRAM (TEE);  Surgeon: Sanda Klein, MD;  Location: [facility] ENDOSCOPY;  Service: Cardiovascular;  Laterality: N/A;  . Cardioversion  09/16/2012    Procedure: CARDIOVERSION;  Surgeon: Sanda Klein, MD;  Location: [facility] ENDOSCOPY;  Service: Cardiovascular;  Laterality: N/A;  . Esophagogastroduodenoscopy  09/20/2012    Dr. [name]: duodenal erosion and possible resolving ulcer at the angularis   . Colonoscopy  09/20/2012    Dr. [name]: multiple tubular adenomas   . Av fistula placement Left 03/27/2014    Procedure: ARTERIOVENOUS (AV) FISTULA CREATION;  Surgeon: Rosetta Posner, MD;  Location: [facility];  Service: Vascular;  Laterality: Left;  . Cardiovascular stress test  12/19/2009    no stress induced rhythm abnormalities, ekg negative for ischemia  . Transesophageal echocardiogram with cardioversion  09/16/2012    EF 60-65%, moderate LVH, moderate regurg of the aortic valve, LA moderately dilated  . Colonoscopy  2005    Dr. [name]. Polyps, path unknown     Social History    Social History  . Marital Status: Married    Spouse Name: N/A  . Number of Children: N/A  . Years of Education: N/A   Social History Main Topics  . Smoking status: Former Smoker -- 1.00 packs/day for 30 years    Types: Cigarettes    Quit date: 08/29/2012  . Smokeless tobacco: Never Used     Comment: quit 08-2012  . Alcohol Use: No  . Drug Use: No  . Sexual Activity: Yes   Other Topics Concern  . None   Social History Narrative    PHYSICAL EXAMINATION  ECOG PERFORMANCE STATUS: 1 - Symptomatic but completely ambulatory  Filed Vitals:   02/06/16 1055  BP: 169/58  Pulse: 66  Temp: 98 F (36.7 C)  Resp: 20    GENERAL:alert, no distress, well nourished, well developed, comfortable, cooperative, obese, smiling and accompanied by her husband SKIN: skin color, texture, turgor are normal, no rashes or significant lesions HEAD: Normocephalic, No masses, lesions, tenderness or abnormalities EYES: normal, EOMI, Conjunctiva are pink and non-injected EARS: External ears normal OROPHARYNX:lips, buccal mucosa, and tongue normal and mucous membranes are moist  NECK: supple, trachea midline LYMPH:  no palpable lymphadenopathy BREAST:not examined LUNGS: clear to auscultation without wheezes, rales, or rhonchi. HEART: regular rate & rhythm, no murmurs, no gallops, S1 normal and S2 normal ABDOMEN:abdomen soft, obese and normal bowel sounds BACK: Back symmetric, no curvature. EXTREMITIES:less then 2 second capillary refill, no joint deformities, effusion, or inflammation, no cyanosis  NEURO: alert & oriented x 3 with fluent speech, no focal motor/sensory deficits, gait normal  LABORATORY DATA: CBC    Component Value Date/Time   WBC 7.5 02/06/2016 0945   RBC 4.09 02/06/2016 0945   RBC 2.63* 02/16/2014 1400   HGB 10.7* 02/06/2016 0945   HCT 34.7* 02/06/2016 0945   PLT 224 02/06/2016 0945   MCV 84.8 02/06/2016 0945   MCH 26.2 02/06/2016 0945   MCHC 30.8 02/06/2016 0945   RDW  16.3* 02/06/2016 0945   LYMPHSABS 0.9 05/07/2015 1320   MONOABS 0.3 05/07/2015 1320   EOSABS 0.0 05/07/2015 1320   BASOSABS 0.0 05/07/2015 1320      Chemistry      Component Value Date/Time   NA 139 02/06/2016 0945   NA 145* 12/27/2015 0908   K 3.8 02/06/2016 0945   CL 103 02/06/2016 0945   CO2 27 02/06/2016 0945   BUN 85* 02/06/2016 0945   BUN 79* 12/27/2015 0908   CREATININE 2.53* 02/06/2016 0945      Component Value Date/Time   CALCIUM 8.8* 02/06/2016 0945   CALCIUM 7.0* 03/16/2014 0835   ALKPHOS 49 09/19/2015 1105   AST 19 09/19/2015 1105   ALT 12 09/19/2015 1105   BILITOT 0.3 09/19/2015 1105   BILITOT 0.4 05/07/2015 1320        PENDING LABS:   RADIOGRAPHIC STUDIES:  No results found.   PATHOLOGY:    ASSESSMENT AND PLAN:  Anemia of chronic renal failure, stage 4 (severe) (HCC) Anemia of chronic renal disease with an anemia of chronic disease/inflammation with FSG in the setting  of SLE (followed by Dr. Dossie Der).  Labs every 2 weeks: CBC   Additional labs today: BMET, and ferritin.  Continue Aranesp 125 mcg every 2 weeks.  Aranesp to be given today.  She sees Dr. Dossie Der, Rheumatology.  She is currently on Plaquenil and her Prednisone dose has been decreased to 5 mg daily by Dr. Dossie Der.    She cancelled her colonoscopy because "there was too much going on."  She did have positive stool cards and thus, the referral to GI for colonoscopy.  She and her husband think that a colonoscopy given her other medical issues will be too difficult.  Return in 12 weeks for follow-up.    ORDERS PLACED FOR THIS ENCOUNTER: Orders Placed This Encounter  Procedures  . Basic metabolic panel  . Ferritin  . Basic metabolic panel  . Ferritin    MEDICATIONS PRESCRIBED THIS ENCOUNTER: Meds ordered this encounter  Medications  . travoprost, benzalkonium, (TRAVATAN) 0.004 % ophthalmic solution    Sig: Place 1 drop into the left eye at bedtime.  . Darbepoetin Alfa (ARANESP)  injection 125 mcg    Sig:     THERAPY PLAN:  Continue ESA therapy as outlined above.  All questions were answered. The patient knows to call the clinic with any problems, questions or concerns. We can certainly see the patient much sooner if necessary.  Patient and plan discussed with Dr. Ancil Linsey and she is in agreement with the aforementioned.   This note is electronically signed by: Doy Mince 02/06/2016 12:47 PM

## 2016-02-06 ENCOUNTER — Encounter (HOSPITAL_COMMUNITY): Payer: Medicare Other | Attending: Hematology & Oncology | Admitting: Oncology

## 2016-02-06 ENCOUNTER — Encounter (HOSPITAL_BASED_OUTPATIENT_CLINIC_OR_DEPARTMENT_OTHER): Payer: Medicare Other

## 2016-02-06 ENCOUNTER — Encounter (HOSPITAL_COMMUNITY): Payer: Self-pay | Admitting: Oncology

## 2016-02-06 ENCOUNTER — Encounter (HOSPITAL_COMMUNITY): Payer: Medicare Other

## 2016-02-06 VITALS — BP 169/58 | HR 66 | Temp 98.0°F | Resp 20 | Wt 210.0 lb

## 2016-02-06 DIAGNOSIS — N179 Acute kidney failure, unspecified: Secondary | ICD-10-CM | POA: Diagnosis not present

## 2016-02-06 DIAGNOSIS — D631 Anemia in chronic kidney disease: Secondary | ICD-10-CM | POA: Insufficient documentation

## 2016-02-06 DIAGNOSIS — N184 Chronic kidney disease, stage 4 (severe): Secondary | ICD-10-CM | POA: Diagnosis not present

## 2016-02-06 DIAGNOSIS — E538 Deficiency of other specified B group vitamins: Secondary | ICD-10-CM | POA: Diagnosis not present

## 2016-02-06 DIAGNOSIS — N189 Chronic kidney disease, unspecified: Secondary | ICD-10-CM | POA: Diagnosis not present

## 2016-02-06 DIAGNOSIS — N058 Unspecified nephritic syndrome with other morphologic changes: Secondary | ICD-10-CM

## 2016-02-06 LAB — BASIC METABOLIC PANEL
Anion gap: 9 (ref 5–15)
BUN: 85 mg/dL — AB (ref 6–20)
CHLORIDE: 103 mmol/L (ref 101–111)
CO2: 27 mmol/L (ref 22–32)
Calcium: 8.8 mg/dL — ABNORMAL LOW (ref 8.9–10.3)
Creatinine, Ser: 2.53 mg/dL — ABNORMAL HIGH (ref 0.44–1.00)
GFR calc Af Amer: 21 mL/min — ABNORMAL LOW (ref >60)
GFR, EST NON AFRICAN AMERICAN: 18 mL/min — AB (ref >60)
GLUCOSE: 230 mg/dL — AB (ref 65–99)
POTASSIUM: 3.8 mmol/L (ref 3.5–5.1)
Sodium: 139 mmol/L (ref 135–145)

## 2016-02-06 LAB — CBC
HCT: 34.7 % — ABNORMAL LOW (ref 36.0–46.0)
HEMOGLOBIN: 10.7 g/dL — AB (ref 12.0–15.0)
MCH: 26.2 pg (ref 26.0–34.0)
MCHC: 30.8 g/dL (ref 30.0–36.0)
MCV: 84.8 fL (ref 78.0–100.0)
PLATELETS: 224 10*3/uL (ref 150–400)
RBC: 4.09 MIL/uL (ref 3.87–5.11)
RDW: 16.3 % — ABNORMAL HIGH (ref 11.5–15.5)
WBC: 7.5 10*3/uL (ref 4.0–10.5)

## 2016-02-06 LAB — FERRITIN: Ferritin: 85 ng/mL (ref 11–307)

## 2016-02-06 MED ORDER — DARBEPOETIN ALFA 150 MCG/0.3ML IJ SOSY
125.0000 ug | PREFILLED_SYRINGE | Freq: Once | INTRAMUSCULAR | Status: AC
  Administered 2016-02-06: 125 ug via SUBCUTANEOUS
  Filled 2016-02-06: qty 0.3

## 2016-02-06 NOTE — Patient Instructions (Signed)
[place] at [facility] Discharge Instructions  RECOMMENDATIONS MADE BY THE CONSULTANT AND ANY TEST RESULTS WILL BE SENT TO YOUR REFERRING PHYSICIAN.  Exam and discussion today with Dana Crigler, PA. Return as scheduled every 2 weeks for lab work and possible Aranesp injection.  Office visit with Dr. Whitney Little in 12 weeks.   Thank you for choosing [place] at [facility] to provide your oncology and hematology care.  To afford each patient quality time with our provider, please arrive at least 15 minutes before your scheduled appointment time.   Beginning January 23rd 2017 lab work for the [facility] will be done in the  Main lab at [facility] on 1st floor. If you have a lab appointment with the [facility] please come in thru the  Main Entrance and check in at the main information desk  You need to re-schedule your appointment should you arrive 10 or more minutes late.  We strive to give you quality time with our providers, and arriving late affects you and other patients whose appointments are after yours.  Also, if you no show three or more times for appointments you may be dismissed from the clinic at the providers discretion.     Again, thank you for choosing [facility].  Our hope is that these requests will decrease the amount of time that you wait before being seen by our physicians.       _____________________________________________________________  Should you have questions after your visit to [facility], please contact our office at [phone] between the hours of 8:30 a.m. and 4:30 p.m.  Voicemails left after 4:30 p.m. will not be returned until the following business day.  For prescription refill requests, have your pharmacy contact our office.         Resources For Cancer Patients and their Caregivers ? American Cancer Society: Can assist with transportation, wigs, general needs, runs Look  Good Feel Better.        978-303-3809 ? Cancer Care: Provides financial assistance, online support groups, medication/co-pay assistance.  1-800-813-HOPE 820-329-3804) ? [place] Assists [place] cancer patients and their families through emotional , educational and financial support.  (352)382-9429 ? [place] DSS Where to apply for food stamps, Medicaid and utility assistance. 510-801-0980 ? RCATS: Transportation to medical appointments. 854 007 8675 ? Social Security Administration: May apply for disability if have a Stage IV cancer. 435-345-7125 (720)587-3817 ? [employer], Disability and Transit Services: Assists with nutrition, care and transit needs. [place] Support Programs: @10RELATIVEDAYS @ > Cancer Support Group  2nd Tuesday of the month 1pm-2pm, Journey Room  > Creative Journey  3rd Tuesday of the month 1130am-1pm, Journey Room  > Look Good Feel Better  1st Wednesday of the month 10am-12 noon, Journey Room (Call [place] to register [phone])

## 2016-02-06 NOTE — Progress Notes (Signed)
[name] presents today for injection per the provider's orders.  Aranesp administration without incident; see MAR for injection details.  Patient tolerated procedure well and without incident.  No questions or complaints noted at this time.  

## 2016-02-06 NOTE — Progress Notes (Signed)
Please see doctors encounter for more information 

## 2016-02-20 ENCOUNTER — Encounter (HOSPITAL_COMMUNITY): Payer: Self-pay

## 2016-02-20 ENCOUNTER — Ambulatory Visit (INDEPENDENT_AMBULATORY_CARE_PROVIDER_SITE_OTHER): Payer: Medicare Other | Admitting: *Deleted

## 2016-02-20 ENCOUNTER — Encounter (HOSPITAL_BASED_OUTPATIENT_CLINIC_OR_DEPARTMENT_OTHER): Payer: Medicare Other

## 2016-02-20 ENCOUNTER — Encounter (HOSPITAL_COMMUNITY): Payer: Medicare Other

## 2016-02-20 VITALS — BP 172/52 | HR 70 | Resp 18

## 2016-02-20 DIAGNOSIS — E538 Deficiency of other specified B group vitamins: Secondary | ICD-10-CM | POA: Diagnosis not present

## 2016-02-20 DIAGNOSIS — N189 Chronic kidney disease, unspecified: Secondary | ICD-10-CM | POA: Diagnosis not present

## 2016-02-20 DIAGNOSIS — N184 Chronic kidney disease, stage 4 (severe): Principal | ICD-10-CM

## 2016-02-20 DIAGNOSIS — D631 Anemia in chronic kidney disease: Secondary | ICD-10-CM | POA: Diagnosis not present

## 2016-02-20 DIAGNOSIS — N058 Unspecified nephritic syndrome with other morphologic changes: Secondary | ICD-10-CM

## 2016-02-20 DIAGNOSIS — Z5181 Encounter for therapeutic drug level monitoring: Secondary | ICD-10-CM

## 2016-02-20 DIAGNOSIS — I4892 Unspecified atrial flutter: Secondary | ICD-10-CM

## 2016-02-20 DIAGNOSIS — N179 Acute kidney failure, unspecified: Secondary | ICD-10-CM | POA: Diagnosis not present

## 2016-02-20 LAB — BASIC METABOLIC PANEL
Anion gap: 8 (ref 5–15)
BUN: 59 mg/dL — AB (ref 6–20)
CO2: 25 mmol/L (ref 22–32)
Calcium: 8.1 mg/dL — ABNORMAL LOW (ref 8.9–10.3)
Chloride: 105 mmol/L (ref 101–111)
Creatinine, Ser: 2.08 mg/dL — ABNORMAL HIGH (ref 0.44–1.00)
GFR, EST AFRICAN AMERICAN: 27 mL/min — AB (ref >60)
GFR, EST NON AFRICAN AMERICAN: 23 mL/min — AB (ref >60)
Glucose, Bld: 121 mg/dL — ABNORMAL HIGH (ref 65–99)
POTASSIUM: 3.6 mmol/L (ref 3.5–5.1)
SODIUM: 138 mmol/L (ref 135–145)

## 2016-02-20 LAB — CBC
HCT: 33.3 % — ABNORMAL LOW (ref 36.0–46.0)
HEMOGLOBIN: 10.2 g/dL — AB (ref 12.0–15.0)
MCH: 25.7 pg — ABNORMAL LOW (ref 26.0–34.0)
MCHC: 30.6 g/dL (ref 30.0–36.0)
MCV: 83.9 fL (ref 78.0–100.0)
Platelets: 198 10*3/uL (ref 150–400)
RBC: 3.97 MIL/uL (ref 3.87–5.11)
RDW: 16.2 % — AB (ref 11.5–15.5)
WBC: 6.4 10*3/uL (ref 4.0–10.5)

## 2016-02-20 LAB — FERRITIN: FERRITIN: 78 ng/mL (ref 11–307)

## 2016-02-20 LAB — POCT INR: INR: 2.7

## 2016-02-20 MED ORDER — DARBEPOETIN ALFA 150 MCG/0.3ML IJ SOSY
125.0000 ug | PREFILLED_SYRINGE | Freq: Once | INTRAMUSCULAR | Status: AC
  Administered 2016-02-20: 125 ug via SUBCUTANEOUS

## 2016-02-20 MED ORDER — DARBEPOETIN ALFA 150 MCG/0.3ML IJ SOSY
PREFILLED_SYRINGE | INTRAMUSCULAR | Status: AC
  Filled 2016-02-20: qty 0.3

## 2016-02-20 NOTE — Patient Instructions (Signed)
[place] at [facility] Discharge Instructions  RECOMMENDATIONS MADE BY THE CONSULTANT AND ANY TEST RESULTS WILL BE SENT TO YOUR REFERRING PHYSICIAN.  Hemoglobin 10.2 aranesp 125 mcg today Follow up as scheduled     Thank you for choosing [place] at [facility] to provide your oncology and hematology care.  To afford each patient quality time with our provider, please arrive at least 15 minutes before your scheduled appointment time.   Beginning January 23rd 2017 lab work for the [facility] will be done in the  Main lab at [facility] on 1st floor. If you have a lab appointment with the [facility] please come in thru the  Main Entrance and check in at the main information desk  You need to re-schedule your appointment should you arrive 10 or more minutes late.  We strive to give you quality time with our providers, and arriving late affects you and other patients whose appointments are after yours.  Also, if you no show three or more times for appointments you may be dismissed from the clinic at the providers discretion.     Again, thank you for choosing [facility].  Our hope is that these requests will decrease the amount of time that you wait before being seen by our physicians.       _____________________________________________________________  Should you have questions after your visit to [facility], please contact our office at [phone] between the hours of 8:30 a.m. and 4:30 p.m.  Voicemails left after 4:30 p.m. will not be returned until the following business day.  For prescription refill requests, have your pharmacy contact our office.         Resources For Cancer Patients and their Caregivers ? American Cancer Society: Can assist with transportation, wigs, general needs, runs Look Good Feel Better.        (904)800-4075 ? Cancer Care: Provides financial assistance, online support  groups, medication/co-pay assistance.  1-800-813-HOPE 9785637998) ? [place] Assists [place] cancer patients and their families through emotional , educational and financial support.  740-492-5456 ? [place] DSS Where to apply for food stamps, Medicaid and utility assistance. 606-503-6945 ? RCATS: Transportation to medical appointments. 279 833 4439 ? Social Security Administration: May apply for disability if have a Stage IV cancer. (917)540-8108 2088179629 ? [employer], Disability and Transit Services: Assists with nutrition, care and transit needs. [place] Support Programs: @10RELATIVEDAYS @ > Cancer Support Group  2nd Tuesday of the month 1pm-2pm, Journey Room  > Creative Journey  3rd Tuesday of the month 1130am-1pm, Journey Room  > Look Good Feel Better  1st Wednesday of the month 10am-12 noon, Journey Room (Call [place] to register [phone])

## 2016-02-20 NOTE — Progress Notes (Signed)
Dr Whitney Muse notified of blood pressure ok to give aranesp, pt will take blood pressure medication this evening. We will continue to monitor pts blood pressure  [name] presents today for injection per the provider's orders.  aranesp 125 mcg administration without incident; see MAR for injection details.  Patient tolerated procedure well and without incident.  No questions or complaints noted at this time.

## 2016-02-21 ENCOUNTER — Other Ambulatory Visit (HOSPITAL_COMMUNITY): Payer: Self-pay | Admitting: Oncology

## 2016-02-26 DIAGNOSIS — H401123 Primary open-angle glaucoma, left eye, severe stage: Secondary | ICD-10-CM | POA: Diagnosis not present

## 2016-02-26 DIAGNOSIS — H401111 Primary open-angle glaucoma, right eye, mild stage: Secondary | ICD-10-CM | POA: Diagnosis not present

## 2016-03-03 ENCOUNTER — Encounter (HOSPITAL_COMMUNITY): Payer: Medicare Other | Attending: Hematology & Oncology

## 2016-03-03 ENCOUNTER — Other Ambulatory Visit: Payer: Self-pay | Admitting: Cardiology

## 2016-03-03 VITALS — BP 170/50 | HR 68 | Temp 98.0°F | Resp 18

## 2016-03-03 DIAGNOSIS — E538 Deficiency of other specified B group vitamins: Secondary | ICD-10-CM | POA: Insufficient documentation

## 2016-03-03 DIAGNOSIS — D631 Anemia in chronic kidney disease: Secondary | ICD-10-CM | POA: Diagnosis not present

## 2016-03-03 DIAGNOSIS — N184 Chronic kidney disease, stage 4 (severe): Secondary | ICD-10-CM

## 2016-03-03 DIAGNOSIS — N189 Chronic kidney disease, unspecified: Secondary | ICD-10-CM | POA: Insufficient documentation

## 2016-03-03 DIAGNOSIS — N058 Unspecified nephritic syndrome with other morphologic changes: Secondary | ICD-10-CM

## 2016-03-03 DIAGNOSIS — N179 Acute kidney failure, unspecified: Secondary | ICD-10-CM | POA: Insufficient documentation

## 2016-03-03 MED ORDER — SODIUM CHLORIDE 0.9 % IV SOLN
250.0000 mg | Freq: Once | INTRAVENOUS | Status: AC
  Administered 2016-03-03: 250 mg via INTRAVENOUS
  Filled 2016-03-03: qty 20

## 2016-03-03 MED ORDER — SODIUM CHLORIDE 0.9 % IV SOLN
Freq: Once | INTRAVENOUS | Status: AC
  Administered 2016-03-03: 14:00:00 via INTRAVENOUS

## 2016-03-03 NOTE — Patient Instructions (Signed)
[facility] at [facility] Discharge Instructions  RECOMMENDATIONS MADE BY THE CONSULTANT AND ANY TEST RESULTS WILL BE SENT TO YOUR REFERRING PHYSICIAN.  IV iron today.    Thank you for choosing [facility] at [facility] to provide your oncology and hematology care.  To afford each patient quality time with our provider, please arrive at least 15 minutes before your scheduled appointment time.   Beginning January 23rd 2017 lab work for the Cancer Center will be done in the  Main lab at [facility] on 1st floor. If you have a lab appointment with the Cancer Center please come in thru the  Main Entrance and check in at the main information desk  You need to re-schedule your appointment should you arrive 10 or more minutes late.  We strive to give you quality time with our providers, and arriving late affects you and other patients whose appointments are after yours.  Also, if you no show three or more times for appointments you may be dismissed from the clinic at the providers discretion.     Again, thank you for choosing [facility].  Our hope is that these requests will decrease the amount of time that you wait before being seen by our physicians.       _____________________________________________________________  Should you have questions after your visit to [facility], please contact our office at [phone] between the hours of 8:30 a.m. and 4:30 p.m.  Voicemails left after 4:30 p.m. will not be returned until the following business day.  For prescription refill requests, have your pharmacy contact our office.         Resources For Cancer Patients and their Caregivers ? American Cancer Society: Can assist with transportation, wigs, general needs, runs Look Good Feel Better.        1-888-227-6333 ? Cancer Care: Provides financial assistance, online support groups, medication/co-pay assistance.  1-800-813-HOPE  (4673) ? [facility] Assists [place] cancer patients and their families through emotional , educational and financial support.  336-427-4357 ? [place] DSS Where to apply for food stamps, Medicaid and utility assistance. 336-342-1394 ? RCATS: Transportation to medical appointments. 336-347-2287 ? Social Security Administration: May apply for disability if have a Stage IV cancer. 336-342-7796 1-800-772-1213 ? [place] Aging, Disability and Transit Services: Assists with nutrition, care and transit needs. 336-349-2343  Cancer Center Support Programs: @10RELATIVEDAYS@ > Cancer Support Group  2nd Tuesday of the month 1pm-2pm, Journey Room  > Creative Journey  3rd Tuesday of the month 1130am-1pm, Journey Room  > Look Good Feel Better  1st Wednesday of the month 10am-12 noon, Journey Room (Call American Cancer [employer] to register [phone])    

## 2016-03-03 NOTE — Progress Notes (Signed)
Patient tolerated infusion well.  VSS.   

## 2016-03-05 ENCOUNTER — Encounter (HOSPITAL_COMMUNITY): Payer: Medicare Other | Attending: Hematology & Oncology

## 2016-03-05 ENCOUNTER — Encounter (HOSPITAL_COMMUNITY): Payer: Medicare Other

## 2016-03-05 ENCOUNTER — Encounter (HOSPITAL_COMMUNITY): Payer: Self-pay

## 2016-03-05 VITALS — BP 148/92 | HR 74 | Temp 98.4°F | Resp 18

## 2016-03-05 DIAGNOSIS — D631 Anemia in chronic kidney disease: Secondary | ICD-10-CM

## 2016-03-05 DIAGNOSIS — N179 Acute kidney failure, unspecified: Secondary | ICD-10-CM | POA: Diagnosis not present

## 2016-03-05 DIAGNOSIS — E538 Deficiency of other specified B group vitamins: Secondary | ICD-10-CM | POA: Diagnosis not present

## 2016-03-05 DIAGNOSIS — N184 Chronic kidney disease, stage 4 (severe): Principal | ICD-10-CM

## 2016-03-05 DIAGNOSIS — N058 Unspecified nephritic syndrome with other morphologic changes: Secondary | ICD-10-CM

## 2016-03-05 DIAGNOSIS — N189 Chronic kidney disease, unspecified: Secondary | ICD-10-CM | POA: Diagnosis not present

## 2016-03-05 LAB — CBC
HCT: 34.4 % — ABNORMAL LOW (ref 36.0–46.0)
Hemoglobin: 10.9 g/dL — ABNORMAL LOW (ref 12.0–15.0)
MCH: 26.5 pg (ref 26.0–34.0)
MCHC: 31.7 g/dL (ref 30.0–36.0)
MCV: 83.5 fL (ref 78.0–100.0)
PLATELETS: 263 10*3/uL (ref 150–400)
RBC: 4.12 MIL/uL (ref 3.87–5.11)
RDW: 16.2 % — ABNORMAL HIGH (ref 11.5–15.5)
WBC: 7.8 10*3/uL (ref 4.0–10.5)

## 2016-03-05 MED ORDER — DARBEPOETIN ALFA 150 MCG/0.3ML IJ SOSY
PREFILLED_SYRINGE | INTRAMUSCULAR | Status: AC
  Filled 2016-03-05: qty 0.3

## 2016-03-05 MED ORDER — DARBEPOETIN ALFA 150 MCG/0.3ML IJ SOSY
125.0000 ug | PREFILLED_SYRINGE | Freq: Once | INTRAMUSCULAR | Status: AC
  Administered 2016-03-05: 125 ug via SUBCUTANEOUS

## 2016-03-06 NOTE — Patient Instructions (Signed)
[place] at [facility] Discharge Instructions  RECOMMENDATIONS MADE BY THE CONSULTANT AND ANY TEST RESULTS WILL BE SENT TO YOUR REFERRING PHYSICIAN.  Aranesp injection today. Return as scheduled for lab work and injections.  Thank you for choosing [place] at [facility] to provide your oncology and hematology care.  To afford each patient quality time with our provider, please arrive at least 15 minutes before your scheduled appointment time.   Beginning January 23rd 2017 lab work for the [facility] will be done in the  Main lab at [facility] on 1st floor. If you have a lab appointment with the [facility] please come in thru the  Main Entrance and check in at the main information desk  You need to re-schedule your appointment should you arrive 10 or more minutes late.  We strive to give you quality time with our providers, and arriving late affects you and other patients whose appointments are after yours.  Also, if you no show three or more times for appointments you may be dismissed from the clinic at the providers discretion.     Again, thank you for choosing [facility].  Our hope is that these requests will decrease the amount of time that you wait before being seen by our physicians.       _____________________________________________________________  Should you have questions after your visit to [facility], please contact our office at [phone] between the hours of 8:30 a.m. and 4:30 p.m.  Voicemails left after 4:30 p.m. will not be returned until the following business day.  For prescription refill requests, have your pharmacy contact our office.         Resources For Cancer Patients and their Caregivers ? American Cancer Society: Can assist with transportation, wigs, general needs, runs Look Good Feel Better.        709-138-7195 ? Cancer Care: Provides financial assistance, online  support groups, medication/co-pay assistance.  1-800-813-HOPE 518-394-8339) ? [place] Assists [place] cancer patients and their families through emotional , educational and financial support.  856-404-4843 ? [place] DSS Where to apply for food stamps, Medicaid and utility assistance. 501-277-8831 ? RCATS: Transportation to medical appointments. (775)148-9236 ? Social Security Administration: May apply for disability if have a Stage IV cancer. 213-252-0265 8735110953 ? [employer], Disability and Transit Services: Assists with nutrition, care and transit needs. [place] Support Programs: @10RELATIVEDAYS @ > Cancer Support Group  2nd Tuesday of the month 1pm-2pm, Journey Room  > Creative Journey  3rd Tuesday of the month 1130am-1pm, Journey Room  > Look Good Feel Better  1st Wednesday of the month 10am-12 noon, Journey Room (Call [place] to register [phone])

## 2016-03-06 NOTE — Progress Notes (Signed)
[name] presents today for injection per the provider's orders.  Aranesp administration without incident; see MAR for injection details.  Patient tolerated procedure well and without incident.  No questions or complaints noted at this time.  

## 2016-03-11 DIAGNOSIS — L89892 Pressure ulcer of other site, stage 2: Secondary | ICD-10-CM | POA: Diagnosis not present

## 2016-03-11 DIAGNOSIS — E1151 Type 2 diabetes mellitus with diabetic peripheral angiopathy without gangrene: Secondary | ICD-10-CM | POA: Diagnosis not present

## 2016-03-11 DIAGNOSIS — E1140 Type 2 diabetes mellitus with diabetic neuropathy, unspecified: Secondary | ICD-10-CM | POA: Diagnosis not present

## 2016-03-18 DIAGNOSIS — B355 Tinea imbricata: Secondary | ICD-10-CM | POA: Diagnosis not present

## 2016-03-18 DIAGNOSIS — J029 Acute pharyngitis, unspecified: Secondary | ICD-10-CM | POA: Diagnosis not present

## 2016-03-19 ENCOUNTER — Encounter (HOSPITAL_COMMUNITY): Payer: Medicare Other

## 2016-03-19 ENCOUNTER — Other Ambulatory Visit: Payer: Self-pay

## 2016-03-19 ENCOUNTER — Encounter (HOSPITAL_BASED_OUTPATIENT_CLINIC_OR_DEPARTMENT_OTHER): Payer: Medicare Other

## 2016-03-19 VITALS — BP 166/40 | HR 63 | Temp 98.2°F | Resp 18

## 2016-03-19 DIAGNOSIS — N058 Unspecified nephritic syndrome with other morphologic changes: Secondary | ICD-10-CM

## 2016-03-19 DIAGNOSIS — I10 Essential (primary) hypertension: Secondary | ICD-10-CM

## 2016-03-19 DIAGNOSIS — D631 Anemia in chronic kidney disease: Secondary | ICD-10-CM | POA: Diagnosis not present

## 2016-03-19 DIAGNOSIS — E785 Hyperlipidemia, unspecified: Secondary | ICD-10-CM

## 2016-03-19 DIAGNOSIS — E538 Deficiency of other specified B group vitamins: Secondary | ICD-10-CM | POA: Diagnosis not present

## 2016-03-19 DIAGNOSIS — N189 Chronic kidney disease, unspecified: Secondary | ICD-10-CM

## 2016-03-19 DIAGNOSIS — I4892 Unspecified atrial flutter: Secondary | ICD-10-CM

## 2016-03-19 DIAGNOSIS — N179 Acute kidney failure, unspecified: Secondary | ICD-10-CM | POA: Diagnosis not present

## 2016-03-19 DIAGNOSIS — N184 Chronic kidney disease, stage 4 (severe): Principal | ICD-10-CM

## 2016-03-19 LAB — CBC
HCT: 32.6 % — ABNORMAL LOW (ref 36.0–46.0)
Hemoglobin: 10.1 g/dL — ABNORMAL LOW (ref 12.0–15.0)
MCH: 26.1 pg (ref 26.0–34.0)
MCHC: 31 g/dL (ref 30.0–36.0)
MCV: 84.2 fL (ref 78.0–100.0)
PLATELETS: 254 10*3/uL (ref 150–400)
RBC: 3.87 MIL/uL (ref 3.87–5.11)
RDW: 16.1 % — AB (ref 11.5–15.5)
WBC: 8.3 10*3/uL (ref 4.0–10.5)

## 2016-03-19 MED ORDER — DARBEPOETIN ALFA 150 MCG/0.3ML IJ SOSY
PREFILLED_SYRINGE | INTRAMUSCULAR | Status: AC
  Filled 2016-03-19: qty 0.3

## 2016-03-19 MED ORDER — DARBEPOETIN ALFA 150 MCG/0.3ML IJ SOSY
125.0000 ug | PREFILLED_SYRINGE | Freq: Once | INTRAMUSCULAR | Status: AC
  Administered 2016-03-19: 125 ug via SUBCUTANEOUS

## 2016-03-19 NOTE — Patient Instructions (Signed)
[facility] at [facility] Discharge Instructions  RECOMMENDATIONS MADE BY THE CONSULTANT AND ANY TEST RESULTS WILL BE SENT TO YOUR REFERRING PHYSICIAN. Aranesp today.    Thank you for choosing [facility] at [facility] to provide your oncology and hematology care.  To afford each patient quality time with our provider, please arrive at least 15 minutes before your scheduled appointment time.   Beginning January 23rd 2017 lab work for the Cancer Center will be done in the  Main lab at [facility] on 1st floor. If you have a lab appointment with the Cancer Center please come in thru the  Main Entrance and check in at the main information desk  You need to re-schedule your appointment should you arrive 10 or more minutes late.  We strive to give you quality time with our providers, and arriving late affects you and other patients whose appointments are after yours.  Also, if you no show three or more times for appointments you may be dismissed from the clinic at the providers discretion.     Again, thank you for choosing [facility].  Our hope is that these requests will decrease the amount of time that you wait before being seen by our physicians.       _____________________________________________________________  Should you have questions after your visit to [facility], please contact our office at [phone] between the hours of 8:30 a.m. and 4:30 p.m.  Voicemails left after 4:30 p.m. will not be returned until the following business day.  For prescription refill requests, have your pharmacy contact our office.         Resources For Cancer Patients and their Caregivers ? American Cancer Society: Can assist with transportation, wigs, general needs, runs Look Good Feel Better.        1-888-227-6333 ? Cancer Care: Provides financial assistance, online support groups, medication/co-pay assistance.  1-800-813-HOPE  (4673) ? [facility] Assists [place] cancer patients and their families through emotional , educational and financial support.  336-427-4357 ? [place] DSS Where to apply for food stamps, Medicaid and utility assistance. 336-342-1394 ? RCATS: Transportation to medical appointments. 336-347-2287 ? Social Security Administration: May apply for disability if have a Stage IV cancer. 336-342-7796 1-800-772-1213 ? [place] Aging, Disability and Transit Services: Assists with nutrition, care and transit needs. 336-349-2343  Cancer Center Support Programs: @10RELATIVEDAYS@ > Cancer Support Group  2nd Tuesday of the month 1pm-2pm, Journey Room  > Creative Journey  3rd Tuesday of the month 1130am-1pm, Journey Room  > Look Good Feel Better  1st Wednesday of the month 10am-12 noon, Journey Room (Call [employer] to register [phone])    

## 2016-03-19 NOTE — Progress Notes (Signed)
Patient c/o chest pain since night before last and some shortness of breath.  VSS, with BP slightly elevated, but baseline for patient.  PA made aware and he ordered an EKG.  Respiratory in the room and test completed and handed to the PA for review.  Stable in comparison to the previous EKG.    [name] presents today for injection per MD orders. Aranesp 188mcg administered SQ in right Abdomen. Administration without incident. Patient tolerated well.

## 2016-03-20 ENCOUNTER — Other Ambulatory Visit: Payer: Self-pay | Admitting: "Endocrinology

## 2016-03-24 DIAGNOSIS — N2581 Secondary hyperparathyroidism of renal origin: Secondary | ICD-10-CM | POA: Diagnosis not present

## 2016-03-24 DIAGNOSIS — M329 Systemic lupus erythematosus, unspecified: Secondary | ICD-10-CM | POA: Diagnosis not present

## 2016-03-24 DIAGNOSIS — N184 Chronic kidney disease, stage 4 (severe): Secondary | ICD-10-CM | POA: Diagnosis not present

## 2016-03-24 DIAGNOSIS — D631 Anemia in chronic kidney disease: Secondary | ICD-10-CM | POA: Diagnosis not present

## 2016-03-24 DIAGNOSIS — N179 Acute kidney failure, unspecified: Secondary | ICD-10-CM | POA: Diagnosis not present

## 2016-03-27 DIAGNOSIS — Z961 Presence of intraocular lens: Secondary | ICD-10-CM | POA: Diagnosis not present

## 2016-03-27 DIAGNOSIS — E113212 Type 2 diabetes mellitus with mild nonproliferative diabetic retinopathy with macular edema, left eye: Secondary | ICD-10-CM | POA: Diagnosis not present

## 2016-03-27 DIAGNOSIS — E113299 Type 2 diabetes mellitus with mild nonproliferative diabetic retinopathy without macular edema, unspecified eye: Secondary | ICD-10-CM | POA: Diagnosis not present

## 2016-04-02 ENCOUNTER — Ambulatory Visit (INDEPENDENT_AMBULATORY_CARE_PROVIDER_SITE_OTHER): Payer: Medicare Other | Admitting: *Deleted

## 2016-04-02 ENCOUNTER — Encounter (HOSPITAL_COMMUNITY): Payer: Medicare Other

## 2016-04-02 ENCOUNTER — Other Ambulatory Visit (HOSPITAL_COMMUNITY): Payer: Medicare Other

## 2016-04-02 DIAGNOSIS — N184 Chronic kidney disease, stage 4 (severe): Secondary | ICD-10-CM | POA: Diagnosis not present

## 2016-04-02 DIAGNOSIS — Z5181 Encounter for therapeutic drug level monitoring: Secondary | ICD-10-CM | POA: Diagnosis not present

## 2016-04-02 DIAGNOSIS — E1122 Type 2 diabetes mellitus with diabetic chronic kidney disease: Secondary | ICD-10-CM | POA: Diagnosis not present

## 2016-04-02 DIAGNOSIS — I4892 Unspecified atrial flutter: Secondary | ICD-10-CM | POA: Diagnosis not present

## 2016-04-02 DIAGNOSIS — Z794 Long term (current) use of insulin: Secondary | ICD-10-CM | POA: Diagnosis not present

## 2016-04-02 LAB — POCT INR: INR: 2.8

## 2016-04-03 ENCOUNTER — Encounter (HOSPITAL_COMMUNITY): Payer: Medicare Other | Attending: Hematology & Oncology

## 2016-04-03 VITALS — BP 158/43 | HR 76 | Resp 16

## 2016-04-03 DIAGNOSIS — N189 Chronic kidney disease, unspecified: Secondary | ICD-10-CM | POA: Insufficient documentation

## 2016-04-03 DIAGNOSIS — D631 Anemia in chronic kidney disease: Secondary | ICD-10-CM

## 2016-04-03 DIAGNOSIS — N179 Acute kidney failure, unspecified: Secondary | ICD-10-CM | POA: Diagnosis not present

## 2016-04-03 DIAGNOSIS — E538 Deficiency of other specified B group vitamins: Secondary | ICD-10-CM | POA: Insufficient documentation

## 2016-04-03 DIAGNOSIS — N184 Chronic kidney disease, stage 4 (severe): Secondary | ICD-10-CM

## 2016-04-03 DIAGNOSIS — N058 Unspecified nephritic syndrome with other morphologic changes: Secondary | ICD-10-CM

## 2016-04-03 LAB — BASIC METABOLIC PANEL
BUN/Creatinine Ratio: 28 (ref 12–28)
BUN: 64 mg/dL — ABNORMAL HIGH (ref 8–27)
CALCIUM: 8.2 mg/dL — AB (ref 8.7–10.3)
CO2: 20 mmol/L (ref 18–29)
Chloride: 102 mmol/L (ref 96–106)
Creatinine, Ser: 2.29 mg/dL — ABNORMAL HIGH (ref 0.57–1.00)
GFR, EST AFRICAN AMERICAN: 24 mL/min/{1.73_m2} — AB (ref >59)
GFR, EST NON AFRICAN AMERICAN: 21 mL/min/{1.73_m2} — AB (ref >59)
Glucose: 91 mg/dL (ref 65–99)
POTASSIUM: 4 mmol/L (ref 3.5–5.2)
Sodium: 142 mmol/L (ref 134–144)

## 2016-04-03 LAB — CBC
HEMATOCRIT: 30.3 % — AB (ref 36.0–46.0)
HEMOGLOBIN: 9.4 g/dL — AB (ref 12.0–15.0)
MCH: 26.3 pg (ref 26.0–34.0)
MCHC: 31 g/dL (ref 30.0–36.0)
MCV: 84.9 fL (ref 78.0–100.0)
PLATELETS: 241 10*3/uL (ref 150–400)
RBC: 3.57 MIL/uL — ABNORMAL LOW (ref 3.87–5.11)
RDW: 16.2 % — AB (ref 11.5–15.5)
WBC: 7.6 10*3/uL (ref 4.0–10.5)

## 2016-04-03 LAB — HEMOGLOBIN A1C
Est. average glucose Bld gHb Est-mCnc: 123 mg/dL
Hgb A1c MFr Bld: 5.9 % — ABNORMAL HIGH (ref 4.8–5.6)

## 2016-04-03 MED ORDER — DARBEPOETIN ALFA 150 MCG/0.3ML IJ SOSY
PREFILLED_SYRINGE | INTRAMUSCULAR | Status: AC
  Filled 2016-04-03: qty 0.3

## 2016-04-03 MED ORDER — DARBEPOETIN ALFA 150 MCG/0.3ML IJ SOSY
125.0000 ug | PREFILLED_SYRINGE | Freq: Once | INTRAMUSCULAR | Status: AC
  Administered 2016-04-03: 125 ug via SUBCUTANEOUS

## 2016-04-03 NOTE — Progress Notes (Signed)
[name] presents today for injection per MD orders. Aranesp 125 mcg administered SQ in left Abdomen. Administration without incident. Patient tolerated well.  

## 2016-04-03 NOTE — Patient Instructions (Signed)
[place] at [facility] Discharge Instructions  RECOMMENDATIONS MADE BY THE CONSULTANT AND ANY TEST RESULTS WILL BE SENT TO YOUR REFERRING PHYSICIAN.  Aranesp given today  Follow up as scheduled Call for any concerns or questions   Thank you for choosing [place] at [facility] to provide your oncology and hematology care.  To afford each patient quality time with our provider, please arrive at least 15 minutes before your scheduled appointment time.   Beginning January 23rd 2017 lab work for the [facility] will be done in the  Main lab at [facility] on 1st floor. If you have a lab appointment with the [facility] please come in thru the  Main Entrance and check in at the main information desk  You need to re-schedule your appointment should you arrive 10 or more minutes late.  We strive to give you quality time with our providers, and arriving late affects you and other patients whose appointments are after yours.  Also, if you no show three or more times for appointments you may be dismissed from the clinic at the providers discretion.     Again, thank you for choosing [facility].  Our hope is that these requests will decrease the amount of time that you wait before being seen by our physicians.       _____________________________________________________________  Should you have questions after your visit to [facility], please contact our office at [phone] between the hours of 8:30 a.m. and 4:30 p.m.  Voicemails left after 4:30 p.m. will not be returned until the following business day.  For prescription refill requests, have your pharmacy contact our office.         Resources For Cancer Patients and their Caregivers ? American Cancer Society: Can assist with transportation, wigs, general needs, runs Look Good Feel Better.        8590252537 ? Cancer Care: Provides financial assistance,  online support groups, medication/co-pay assistance.  1-800-813-HOPE 252 259 2325) ? [place] Assists [place] cancer patients and their families through emotional , educational and financial support.  [phone] ? [place] DSS Where to apply for food stamps, Medicaid and utility assistance. 8015371312 ? RCATS: Transportation to medical appointments. 432-049-6040 ? Social Security Administration: May apply for disability if have a Stage IV cancer. 743-777-0652 801-153-2845 ? [employer], Disability and Transit Services: Assists with nutrition, care and transit needs. [place] Support Programs: @10RELATIVEDAYS @ > Cancer Support Group  2nd Tuesday of the month 1pm-2pm, Journey Room  > Creative Journey  3rd Tuesday of the month 1130am-1pm, Journey Room  > Look Good Feel Better  1st Wednesday of the month 10am-12 noon, Journey Room (Call [place] to register [phone])

## 2016-04-09 ENCOUNTER — Ambulatory Visit (INDEPENDENT_AMBULATORY_CARE_PROVIDER_SITE_OTHER): Payer: Medicare Other | Admitting: "Endocrinology

## 2016-04-09 ENCOUNTER — Encounter: Payer: Self-pay | Admitting: "Endocrinology

## 2016-04-09 VITALS — BP 127/70 | HR 80 | Ht 65.0 in | Wt 215.0 lb

## 2016-04-09 DIAGNOSIS — I10 Essential (primary) hypertension: Secondary | ICD-10-CM | POA: Diagnosis not present

## 2016-04-09 DIAGNOSIS — N184 Chronic kidney disease, stage 4 (severe): Secondary | ICD-10-CM

## 2016-04-09 DIAGNOSIS — Z794 Long term (current) use of insulin: Secondary | ICD-10-CM

## 2016-04-09 DIAGNOSIS — E1122 Type 2 diabetes mellitus with diabetic chronic kidney disease: Secondary | ICD-10-CM | POA: Diagnosis not present

## 2016-04-09 DIAGNOSIS — E785 Hyperlipidemia, unspecified: Secondary | ICD-10-CM

## 2016-04-09 NOTE — Progress Notes (Signed)
Subjective:    Patient ID: [name], female    DOB: 22-Mar-1946,    Past Medical History  Diagnosis Date  . Asthma   . Essential hypertension, benign   . Type 2 diabetes mellitus ([place])   . SLE (systemic lupus erythematosus) ([place])   . Anemia   . Atrial flutter [facility])     TEE/DCCV December 2013 - on Xarelto [facility])  . CKD (chronic Little disease) stage 3, GFR 30-59 ml/min   . Bone spur of toe     Right 5th toe  . UTI (lower urinary tract infection)   . DVT of upper extremity (deep vein thrombosis) ([place])     Right basilic vein, June 123456   Past Surgical History  Procedure Laterality Date  . Ankle surgery  1993  . Back surgery  1980  . Abdominal hysterectomy  1983  . Tee without cardioversion  09/16/2012    Procedure: TRANSESOPHAGEAL ECHOCARDIOGRAM (TEE);  Surgeon: Sanda Klein, MD;  Location: [facility] ENDOSCOPY;  Service: Cardiovascular;  Laterality: N/A;  . Cardioversion  09/16/2012    Procedure: CARDIOVERSION;  Surgeon: Sanda Klein, MD;  Location: [facility] ENDOSCOPY;  Service: Cardiovascular;  Laterality: N/A;  . Esophagogastroduodenoscopy  09/20/2012    Dr. [name]: duodenal erosion and possible resolving ulcer at the angularis   . Colonoscopy  09/20/2012    Dr. [name]: multiple tubular adenomas   . Av fistula placement Left 03/27/2014    Procedure: ARTERIOVENOUS (AV) FISTULA CREATION;  Surgeon: Rosetta Posner, MD;  Location: [facility] OR;  Service: Vascular;  Laterality: Left;  . Cardiovascular stress test  12/19/2009    no stress induced rhythm abnormalities, ekg negative for ischemia  . Transesophageal echocardiogram with cardioversion  09/16/2012    EF 60-65%, moderate LVH, moderate regurg of the aortic valve, LA moderately dilated  . Colonoscopy  2005    Dr. [name]. Polyps, path unknown    Social History   Social History  . Marital Status: Married    Spouse Name: N/A  . Number of Children: N/A  . Years of Education: N/A   Social History Main Topics  . Smoking  status: Former Smoker -- 1.00 packs/day for 30 years    Types: Cigarettes    Quit date: 08/29/2012  . Smokeless tobacco: Never Used     Comment: quit 08-2012  . Alcohol Use: No  . Drug Use: No  . Sexual Activity: Yes   Other Topics Concern  . None   Social History Narrative   Outpatient Encounter Prescriptions as of 04/09/2016  Medication Sig  . acetaminophen (TYLENOL) 500 MG tablet Take 1,000 mg by mouth every 6 (six) hours as needed. For pain  . albuterol (ACCUNEB) 0.63 MG/3ML nebulizer solution 1 ampule.  Marland Kitchen albuterol (PROVENTIL HFA;VENTOLIN HFA) 108 (90 BASE) MCG/ACT inhaler Inhale 2 puffs into the lungs every 6 (six) hours as needed. For shortness of breath  . amLODipine (NORVASC) 10 MG tablet Take 1 tablet (10 mg total) by mouth at bedtime.  Marland Kitchen atorvastatin (LIPITOR) 40 MG tablet Take 40 mg by mouth daily. 1/2 tablet 20 mg  . brimonidine-timolol (COMBIGAN) 0.2-0.5 % ophthalmic solution Apply 1 drop to eye daily.   . calcitRIOL (ROCALTROL) 0.25 MCG capsule Take 0.25 mcg by mouth as directed. Take on Mondays Wednesdays and Fridays  . diclofenac (VOLTAREN) 0.1 % ophthalmic solution Place 1 drop into the left eye daily. Uses at lunch time  . folic acid (FOLVITE) 1 MG tablet Take 1 mg by mouth daily.  Marland Kitchen  furosemide (LASIX) 80 MG tablet Take 80 mg by mouth 2 (two) times daily. Take 2 in the AM and 2 in the Evening  . HYDROcodone-acetaminophen (NORCO/VICODIN) 5-325 MG per tablet Take 1-2 tablets by mouth every 4 (four) hours as needed for moderate pain.  . hydroxychloroquine (PLAQUENIL) 200 MG tablet Take 200 mg by mouth 2 (two) times daily.  . insulin aspart (NOVOLOG) 100 UNIT/ML injection Inject 5-11 Units into the skin 3 (three) times daily with meals.  . Insulin Detemir (LEVEMIR) 100 UNIT/ML Pen Inject 12 Units into the skin at bedtime.  . insulin lispro (HUMALOG KWIKPEN) 100 UNIT/ML KiwkPen Up to 11 units TIDAC  . metoprolol (LOPRESSOR) 50 MG tablet TAKE ONE TABLET BY MOUTH TWICE  DAILY.  . Multiple Vitamins-Minerals (CENTRUM SILVER PO) Take 1 tablet by mouth daily.  . ONE TOUCH ULTRA TEST test strip TEST 4 TIMES A DAY AS DIRECTED.  Marland Kitchen ONETOUCH DELICA LANCETS 99991111 MISC USE TO TEST 4 TIMES DAILY.  Marland Kitchen polyethylene glycol-electrolytes (NULYTELY/GOLYTELY) 420 g solution Take 4,000 mLs by mouth once.  . predniSONE (DELTASONE) 20 MG tablet Take 5 mg by mouth as directed. Pt takes 10 mg and 5mg  alternating days  . travoprost, benzalkonium, (TRAVATAN) 0.004 % ophthalmic solution Place 1 drop into the left eye at bedtime.  . Vitamin D, Ergocalciferol, (DRISDOL) 50000 units CAPS capsule TAKE ONE CAPSULE BY MOUTH ONCE WEEKLY.  . warfarin (COUMADIN) 5 MG tablet Take 1 1/2 tablets daily except 1 tablet on Thursdays (Patient taking differently: Take 1 1/2 tablets daily except 1 tablet on Thursdays, Tuesday, and Saturday)   No facility-administered encounter medications on file as of 04/09/2016.   ALLERGIES: Allergies  Allergen Reactions  . Ace Inhibitors Cough   VACCINATION STATUS: Immunization History  Administered Date(s) Administered  . Influenza,inj,Quad PF,36+ Mos 07/03/2014  . Influenza-Unspecified 07/11/2015    Diabetes She presents for her follow-up diabetic visit. She has type 2 diabetes mellitus. Onset time: diagnosed at approx age of 88. Her disease course has been improving. Pertinent negatives for hypoglycemia include no confusion, headaches, pallor or seizures. Associated symptoms include fatigue. Pertinent negatives for diabetes include no chest pain, no polydipsia, no polyphagia and no polyuria. Symptoms are improving. Diabetic complications include nephropathy. Risk factors for coronary artery disease include dyslipidemia, diabetes mellitus and obesity. She is compliant with treatment most of the time. Her weight is stable. She is following a generally unhealthy diet. She has had a previous visit with a dietitian. She never participates in exercise. Her home blood  glucose trend is decreasing steadily (Her A1c has improved to 6%, no hypoglycemia. Her EAG is between 100- 150.). Her breakfast blood glucose range is generally 140-180 mg/dl. Her lunch blood glucose range is generally 140-180 mg/dl. Her dinner blood glucose range is generally 140-180 mg/dl. Her overall blood glucose range is 140-180 mg/dl. An ACE inhibitor/angiotensin II receptor blocker is being taken.  Hypertension Pertinent negatives include no chest pain, headaches, palpitations or shortness of breath.  Hyperlipidemia Pertinent negatives include no chest pain, myalgias or shortness of breath. Current antihyperlipidemic treatment includes statins.     Review of Systems  Constitutional: Positive for fatigue. Negative for unexpected weight change.  HENT: Negative for trouble swallowing and voice change.   Eyes: Negative for visual disturbance.  Respiratory: Negative for cough, shortness of breath and wheezing.   Cardiovascular: Negative for chest pain, palpitations and leg swelling.  Gastrointestinal: Negative for nausea, vomiting and diarrhea.  Endocrine: Negative for cold intolerance, heat intolerance, polydipsia, polyphagia  and polyuria.  Musculoskeletal: Negative for myalgias and arthralgias.  Skin: Negative for color change, pallor, rash and wound.  Neurological: Negative for seizures and headaches.  Psychiatric/Behavioral: Negative for suicidal ideas and confusion.    Objective:    BP 127/70 mmHg  Pulse 80  Ht 5\' 5"  (1.651 m)  Wt 215 lb (97.523 kg)  BMI 35.78 kg/m2  Wt Readings from Last 3 Encounters:  04/09/16 215 lb (97.523 kg)  02/06/16 210 lb (95.255 kg)  01/03/16 215 lb (97.523 kg)    Physical Exam  Constitutional: She is oriented to person, place, and time. She appears well-developed.  HENT:  Head: Normocephalic and atraumatic.  Eyes: EOM are normal.  Neck: Normal range of motion. Neck supple. No tracheal deviation present. No thyromegaly present.   Cardiovascular: Normal rate and regular rhythm.   Pulmonary/Chest: Effort normal and breath sounds normal.  Abdominal: Soft. Bowel sounds are normal. There is no tenderness. There is no guarding.  Musculoskeletal: Normal range of motion. She exhibits no edema.  Neurological: She is alert and oriented to person, place, and time. She has normal reflexes. No cranial nerve deficit. Coordination normal.  Skin: Skin is warm and dry. No rash noted. No erythema. No pallor.  Psychiatric: She has a normal mood and affect. Judgment normal.    Chemistry (most recent): Lab Results  Component Value Date   NA 142 04/02/2016   K 4.0 04/02/2016   CL 102 04/02/2016   CO2 20 04/02/2016   BUN 64* 04/02/2016   CREATININE 2.29* 04/02/2016   Diabetic Labs (most recent): Lab Results  Component Value Date   HGBA1C 5.9* 04/02/2016   HGBA1C 6.0* 12/27/2015   HGBA1C 6.1* 09/19/2015     Assessment & Plan:   1. Type 2 diabetes mellitus with stage 4 chronic Little disease Patient is advised to stick to a routine mealtimes to eat 3 meals  a day and avoid unnecessary snacks ( to snack only to correct hypoglycemia). -Her A1c is 6% in disagreement with her estimated average blood glucose of 100-150 likely still  due to mild anemia with hemoglobin of 9.4. Patient is advised to eliminate simple carbs  from their diet including cakes desserts ice cream soda (  diet and regular) , sweet tea , Candies,  chips and cookies, artificial sweeteners,   and "sugar-free" products .  This will help patient to have stable blood glucose profile and potentially lose weight. Patient is given detailed personalized glucose monitoring and insulin dosing instructions. Patient is instructed to call back with extremes of blood glucose less than 70 or greater than 300. Patient to bring meter and  blood glucose logs during their next visit.  I will continue Levemir   12 units qhs, continue novolog 5 units TIDAC plus correction. I will  request for repeat of the following labs before next visit. -She has a chance to come off of NovoLog only if she is willing to avoid concentrated carbohydrates such as soda.  2. Essential hypertension, benign Controlled. Continue ARB.  3. Hyperlipemia Continue atorvastatatin.   4. Morbid obesity Carbs advise given. See above #1. 5. vitamin D deficiency.  -Continue current Vitamin D supplement. Will obtain : - Vit D  25 hydroxy (rtn osteoporosis monitoring)   Follow up plan: Return in about 3 months (around 07/10/2016) for follow up with pre-visit labs, meter, and logs.  Glade Lloyd, MD Phone: [phone]  Fax: [phone]   04/09/2016, 12:03 PM

## 2016-04-09 NOTE — Patient Instructions (Signed)

## 2016-04-16 ENCOUNTER — Encounter (HOSPITAL_COMMUNITY): Payer: Self-pay

## 2016-04-16 ENCOUNTER — Encounter (HOSPITAL_COMMUNITY): Payer: Medicare Other | Attending: Hematology & Oncology

## 2016-04-16 ENCOUNTER — Other Ambulatory Visit: Payer: Self-pay | Admitting: "Endocrinology

## 2016-04-16 ENCOUNTER — Encounter (HOSPITAL_COMMUNITY): Payer: Medicare Other

## 2016-04-16 VITALS — BP 158/67 | HR 66 | Temp 98.4°F | Resp 18

## 2016-04-16 DIAGNOSIS — N058 Unspecified nephritic syndrome with other morphologic changes: Secondary | ICD-10-CM

## 2016-04-16 DIAGNOSIS — N184 Chronic kidney disease, stage 4 (severe): Principal | ICD-10-CM

## 2016-04-16 DIAGNOSIS — N189 Chronic kidney disease, unspecified: Secondary | ICD-10-CM | POA: Diagnosis not present

## 2016-04-16 DIAGNOSIS — D631 Anemia in chronic kidney disease: Secondary | ICD-10-CM | POA: Diagnosis not present

## 2016-04-16 DIAGNOSIS — E538 Deficiency of other specified B group vitamins: Secondary | ICD-10-CM | POA: Diagnosis not present

## 2016-04-16 DIAGNOSIS — N179 Acute kidney failure, unspecified: Secondary | ICD-10-CM | POA: Diagnosis not present

## 2016-04-16 LAB — CBC
HCT: 31.1 % — ABNORMAL LOW (ref 36.0–46.0)
HEMOGLOBIN: 9.6 g/dL — AB (ref 12.0–15.0)
MCH: 26.1 pg (ref 26.0–34.0)
MCHC: 30.9 g/dL (ref 30.0–36.0)
MCV: 84.5 fL (ref 78.0–100.0)
Platelets: 217 10*3/uL (ref 150–400)
RBC: 3.68 MIL/uL — AB (ref 3.87–5.11)
RDW: 16.8 % — ABNORMAL HIGH (ref 11.5–15.5)
WBC: 6.6 10*3/uL (ref 4.0–10.5)

## 2016-04-16 MED ORDER — DARBEPOETIN ALFA 150 MCG/0.3ML IJ SOSY
PREFILLED_SYRINGE | INTRAMUSCULAR | Status: AC
  Filled 2016-04-16: qty 0.3

## 2016-04-16 MED ORDER — DARBEPOETIN ALFA 150 MCG/0.3ML IJ SOSY
125.0000 ug | PREFILLED_SYRINGE | Freq: Once | INTRAMUSCULAR | Status: AC
  Administered 2016-04-16: 125 ug via SUBCUTANEOUS

## 2016-04-16 NOTE — Progress Notes (Signed)
[name]'s reason for visit today is for an injection and labs as scheduled per MD orders.  Labs were drawn prior to administration of ordered medication.  [name] also received Aranesp 1107mcg SQ  per MD orders; see [facility] for administration details.  [name] tolerated all procedures well and without incident; questions were answered and patient was discharged.

## 2016-04-16 NOTE — Patient Instructions (Signed)
[place] at [facility] Discharge Instructions  RECOMMENDATIONS MADE BY THE CONSULTANT AND ANY TEST RESULTS WILL BE SENT TO YOUR REFERRING PHYSICIAN.  Aranesp injection given today. Follow-up as scheduled. Call clinic for any questions or concerns  Thank you for choosing [place] at [facility] to provide your oncology and hematology care.  To afford each patient quality time with our provider, please arrive at least 15 minutes before your scheduled appointment time.   Beginning January 23rd 2017 lab work for the [facility] will be done in the  Main lab at [facility] on 1st floor. If you have a lab appointment with the [facility] please come in thru the  Main Entrance and check in at the main information desk  You need to re-schedule your appointment should you arrive 10 or more minutes late.  We strive to give you quality time with our providers, and arriving late affects you and other patients whose appointments are after yours.  Also, if you no show three or more times for appointments you may be dismissed from the clinic at the providers discretion.     Again, thank you for choosing [facility].  Our hope is that these requests will decrease the amount of time that you wait before being seen by our physicians.       _____________________________________________________________  Should you have questions after your visit to [facility], please contact our office at [phone] between the hours of 8:30 a.m. and 4:30 p.m.  Voicemails left after 4:30 p.m. will not be returned until the following business day.  For prescription refill requests, have your pharmacy contact our office.         Resources For Cancer Patients and their Caregivers ? American Cancer Society: Can assist with transportation, wigs, general needs, runs Look Good Feel Better.        ([phone] ? Cancer Care: Provides financial  assistance, online support groups, medication/co-pay assistance.  1-800-813-HOPE (317)229-4165) ? [place] Assists [place] cancer patients and their families through emotional , educational and financial support.  769-685-8542 ? [place] DSS Where to apply for food stamps, Medicaid and utility assistance. (662)134-1649 ? RCATS: Transportation to medical appointments. 773-594-6126 ? Social Security Administration: May apply for disability if have a Stage IV cancer. [phone] 641-087-0581 ? [employer], Disability and Transit Services: Assists with nutrition, care and transit needs. [place] Support Programs: @10RELATIVEDAYS @ > Cancer Support Group  2nd Tuesday of the month 1pm-2pm, Journey Room  > Creative Journey  3rd Tuesday of the month 1130am-1pm, Journey Room  > Look Good Feel Better  1st Wednesday of the month 10am-12 noon, Journey Room (Call [place] to register [phone])

## 2016-04-17 DIAGNOSIS — M179 Osteoarthritis of knee, unspecified: Secondary | ICD-10-CM | POA: Diagnosis not present

## 2016-04-17 DIAGNOSIS — M329 Systemic lupus erythematosus, unspecified: Secondary | ICD-10-CM | POA: Diagnosis not present

## 2016-04-17 DIAGNOSIS — M810 Age-related osteoporosis without current pathological fracture: Secondary | ICD-10-CM | POA: Diagnosis not present

## 2016-04-17 DIAGNOSIS — N189 Chronic kidney disease, unspecified: Secondary | ICD-10-CM | POA: Diagnosis not present

## 2016-04-17 DIAGNOSIS — M150 Primary generalized (osteo)arthritis: Secondary | ICD-10-CM | POA: Diagnosis not present

## 2016-04-22 DIAGNOSIS — L89892 Pressure ulcer of other site, stage 2: Secondary | ICD-10-CM | POA: Diagnosis not present

## 2016-04-22 DIAGNOSIS — E1140 Type 2 diabetes mellitus with diabetic neuropathy, unspecified: Secondary | ICD-10-CM | POA: Diagnosis not present

## 2016-04-22 DIAGNOSIS — E1151 Type 2 diabetes mellitus with diabetic peripheral angiopathy without gangrene: Secondary | ICD-10-CM | POA: Diagnosis not present

## 2016-04-30 ENCOUNTER — Other Ambulatory Visit (HOSPITAL_COMMUNITY): Payer: Medicare Other

## 2016-04-30 ENCOUNTER — Ambulatory Visit (HOSPITAL_COMMUNITY): Payer: Medicare Other | Admitting: Hematology & Oncology

## 2016-04-30 ENCOUNTER — Encounter (HOSPITAL_COMMUNITY): Payer: Medicare Other

## 2016-05-01 ENCOUNTER — Encounter (HOSPITAL_COMMUNITY): Payer: Self-pay

## 2016-05-01 ENCOUNTER — Encounter (HOSPITAL_COMMUNITY): Payer: Medicare Other

## 2016-05-01 ENCOUNTER — Encounter (HOSPITAL_COMMUNITY): Payer: Self-pay | Admitting: Hematology & Oncology

## 2016-05-01 ENCOUNTER — Encounter (HOSPITAL_COMMUNITY): Payer: Medicare Other | Attending: Hematology & Oncology

## 2016-05-01 ENCOUNTER — Encounter (HOSPITAL_COMMUNITY): Payer: Medicare Other | Attending: Hematology & Oncology | Admitting: Hematology & Oncology

## 2016-05-01 VITALS — BP 157/73 | HR 63 | Temp 97.8°F | Resp 18 | Wt 214.8 lb

## 2016-05-01 DIAGNOSIS — D509 Iron deficiency anemia, unspecified: Secondary | ICD-10-CM | POA: Diagnosis not present

## 2016-05-01 DIAGNOSIS — D631 Anemia in chronic kidney disease: Secondary | ICD-10-CM | POA: Diagnosis not present

## 2016-05-01 DIAGNOSIS — R195 Other fecal abnormalities: Secondary | ICD-10-CM

## 2016-05-01 DIAGNOSIS — N184 Chronic kidney disease, stage 4 (severe): Secondary | ICD-10-CM

## 2016-05-01 DIAGNOSIS — N058 Unspecified nephritic syndrome with other morphologic changes: Secondary | ICD-10-CM

## 2016-05-01 DIAGNOSIS — E538 Deficiency of other specified B group vitamins: Secondary | ICD-10-CM | POA: Insufficient documentation

## 2016-05-01 DIAGNOSIS — N189 Chronic kidney disease, unspecified: Secondary | ICD-10-CM

## 2016-05-01 DIAGNOSIS — N179 Acute kidney failure, unspecified: Secondary | ICD-10-CM | POA: Diagnosis not present

## 2016-05-01 DIAGNOSIS — M329 Systemic lupus erythematosus, unspecified: Secondary | ICD-10-CM

## 2016-05-01 LAB — CBC WITH DIFFERENTIAL/PLATELET
BASOS PCT: 1 %
Basophils Absolute: 0 10*3/uL (ref 0.0–0.1)
Eosinophils Absolute: 0.1 10*3/uL (ref 0.0–0.7)
Eosinophils Relative: 1 %
HEMATOCRIT: 32.1 % — AB (ref 36.0–46.0)
HEMOGLOBIN: 9.9 g/dL — AB (ref 12.0–15.0)
LYMPHS ABS: 1 10*3/uL (ref 0.7–4.0)
Lymphocytes Relative: 20 %
MCH: 26.3 pg (ref 26.0–34.0)
MCHC: 30.8 g/dL (ref 30.0–36.0)
MCV: 85.1 fL (ref 78.0–100.0)
MONOS PCT: 9 %
Monocytes Absolute: 0.5 10*3/uL (ref 0.1–1.0)
NEUTROS ABS: 3.5 10*3/uL (ref 1.7–7.7)
NEUTROS PCT: 69 %
Platelets: 213 10*3/uL (ref 150–400)
RBC: 3.77 MIL/uL — ABNORMAL LOW (ref 3.87–5.11)
RDW: 16.9 % — ABNORMAL HIGH (ref 11.5–15.5)
WBC: 5 10*3/uL (ref 4.0–10.5)

## 2016-05-01 MED ORDER — DARBEPOETIN ALFA 150 MCG/0.3ML IJ SOSY
125.0000 ug | PREFILLED_SYRINGE | Freq: Once | INTRAMUSCULAR | Status: AC
  Administered 2016-05-01: 125 ug via SUBCUTANEOUS

## 2016-05-01 NOTE — Patient Instructions (Signed)
[place] at [facility] Discharge Instructions  RECOMMENDATIONS MADE BY THE CONSULTANT AND ANY TEST RESULTS WILL BE SENT TO YOUR REFERRING PHYSICIAN.  RETURN IN 3 MONTHS.   CHECK FERRITIN IN 2 WEEKS GET NEW  ARANESP SCHEDULE. GET NEW LAB SCHEDULE. HEMOCCULT STOOL CARDS AT HOME .  RETURN AT YOUR LEISURE.    Thank you for choosing [place] at [facility] to provide your oncology and hematology care.  To afford each patient quality time with our provider, please arrive at least 15 minutes before your scheduled appointment time.   Beginning January 23rd 2017 lab work for the [facility] will be done in the  Main lab at [facility] on 1st floor. If you have a lab appointment with the [facility] please come in thru the  Main Entrance and check in at the main information desk  You need to re-schedule your appointment should you arrive 10 or more minutes late.  We strive to give you quality time with our providers, and arriving late affects you and other patients whose appointments are after yours.  Also, if you no show three or more times for appointments you may be dismissed from the clinic at the providers discretion.     Again, thank you for choosing [facility].  Our hope is that these requests will decrease the amount of time that you wait before being seen by our physicians.       _____________________________________________________________  Should you have questions after your visit to [facility], please contact our office at [phone] between the hours of 8:30 a.m. and 4:30 p.m.  Voicemails left after 4:30 p.m. will not be returned until the following business day.  For prescription refill requests, have your pharmacy contact our office.         Resources For Cancer Patients and their Caregivers ? American Cancer Society: Can assist with transportation, wigs, general needs, runs Look Good Feel  Better.        2763389575 ? Cancer Care: Provides financial assistance, online support groups, medication/co-pay assistance.  1-800-813-HOPE (314) 753-9649) ? [place] Assists [place] cancer patients and their families through emotional , educational and financial support.  575-048-1233 ? [place] DSS Where to apply for food stamps, Medicaid and utility assistance. 248-235-6677 ? RCATS: Transportation to medical appointments. 3376460211 ? Social Security Administration: May apply for disability if have a Stage IV cancer. [phone] (630)[phone] ? [employer], Disability and Transit Services: Assists with nutrition, care and transit needs. [place] Support Programs: @10RELATIVEDAYS @ > Cancer Support Group  2nd Tuesday of the month 1pm-2pm, Journey Room  > Creative Journey  3rd Tuesday of the month 1130am-1pm, Journey Room  > Look Good Feel Better  1st Wednesday of the month 10am-12 noon, Journey Room (Call [place] to register [phone])

## 2016-05-01 NOTE — Patient Instructions (Signed)
[place] at [facility] Discharge Instructions  RECOMMENDATIONS MADE BY THE CONSULTANT AND ANY TEST RESULTS WILL BE SENT TO YOUR REFERRING PHYSICIAN.  Aranesp 135mcg given per protocol Follow up as scheduled  Thank you for choosing [place] at [facility] to provide your oncology and hematology care.  To afford each patient quality time with our provider, please arrive at least 15 minutes before your scheduled appointment time.   Beginning January 23rd 2017 lab work for the [facility] will be done in the  Main lab at [facility] on 1st floor. If you have a lab appointment with the [place] please come in thru the  Main Entrance and check in at the main information desk  You need to re-schedule your appointment should you arrive 10 or more minutes late.  We strive to give you quality time with our providers, and arriving late affects you and other patients whose appointments are after yours.  Also, if you no show three or more times for appointments you may be dismissed from the clinic at the providers discretion.     Again, thank you for choosing [facility].  Our hope is that these requests will decrease the amount of time that you wait before being seen by our physicians.       _____________________________________________________________  Should you have questions after your visit to [facility], please contact our office at [phone] between the hours of 8:30 a.m. and 4:30 p.m.  Voicemails left after 4:30 p.m. will not be returned until the following business day.  For prescription refill requests, have your pharmacy contact our office.         Resources For Cancer Patients and their Caregivers ? American Cancer Society: Can assist with transportation, wigs, general needs, runs Look Good Feel Better.        317-387-3099 ? Cancer Care: Provides financial assistance, online support groups,  medication/co-pay assistance.  1-800-813-HOPE (903)716-5475) ? [place] Assists [place] cancer patients and their families through emotional , educational and financial support.  ([phone] ? [place] DSS Where to apply for food stamps, Medicaid and utility assistance. 423-280-3707 ? RCATS: Transportation to medical appointments. 6622935081 ? Social Security Administration: May apply for disability if have a Stage IV cancer. 937-742-7439 732-576-3301 ? [employer], Disability and Transit Services: Assists with nutrition, care and transit needs. [place] Support Programs: @10RELATIVEDAYS @ > Cancer Support Group  2nd Tuesday of the month 1pm-2pm, Journey Room  > Creative Journey  3rd Tuesday of the month 1130am-1pm, Journey Room  > Look Good Feel Better  1st Wednesday of the month 10am-12 noon, Journey Room (Call [place] to register [phone])

## 2016-05-01 NOTE — Progress Notes (Signed)
Dana Kilts, MD [place] [id number]    DIAGNOSIS:   Anemia Folic acid deficiency CKD Focal segmental glomerulosclerosis without nephrosis or chronic glomerulonephritis SLE Coumadin, history of UE DVT, a-flutter     CURRENT THERAPY: Skipper Cliche,         Aranesp last given on 04/06/2014    INTERVAL HISTORY: [name] 70 y.o. female returns for follow-up of anemia. She has lupus and FSG. She also has a history of folic acid deficiency. She has ongoing Coumadin use because of a history of an upper extremity DVT and atrial flutter. Coumadin is currently being continued for stroke prophylaxis. She has a fistula placed in preparation for dialysis.  She gets yearly mammograms. She is eating well and sleeping well.   Sees Dr. Amil Amen for rheumatology. She currently denies being on active therapy for her lupus. On chart review is is noted to be on plaquenil.  [name] is accompanied by her husband and presents in a wheelchair. I personally reviewed and went over laboratory studies with the patient.  Her husband notes that a colonoscopy would not be rescheduled because she would have to take a lot of medicine to be able to have a colonoscopy. The patient believes the previous blood in stool was because of constipation.  She has been feeling pretty good. Her arthritis continues to flare up, coming and going in her shoulders and hands. She is eating okay. She feels better in the warm weather than the cold. She has not done anything over the summer since she spends most of her time going back and forth to doctor's visits. Her family has visited her and had cook outs.   She takes milk of magnesia for constipation. States she has not taken any recently. She takes Calcitriol in the morning.   She denies abdominal pain.   She receives annual mammograms.    MEDICAL HISTORY: Past Medical History:  Diagnosis Date  . Anemia   . Asthma   . Atrial  flutter [facility])    TEE/DCCV December 2013 - on Xarelto [facility])  . Bone spur of toe    Right 5th toe  . CKD (chronic kidney disease) stage 3, GFR 30-59 ml/min   . DVT of upper extremity (deep vein thrombosis) ([place])    Right basilic vein, June 123456  . Essential hypertension, benign   . SLE (systemic lupus erythematosus) ([place])   . Type 2 diabetes mellitus ([place])   . 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     is allergic to ace inhibitors.  [name] does not currently have medications on file.  SURGICAL HISTORY: Past Surgical History:  Procedure Laterality Date  . ABDOMINAL HYSTERECTOMY  1983  . ANKLE SURGERY  1993  . AV FISTULA PLACEMENT Left 03/27/2014   Procedure: ARTERIOVENOUS (AV) FISTULA CREATION;  Surgeon: Rosetta Posner, MD;  Location: [facility]  OR;  Service: Vascular;  Laterality: Left;  . BACK SURGERY  1980  . CARDIOVASCULAR STRESS TEST  12/19/2009   no stress induced rhythm abnormalities, ekg negative for ischemia  . CARDIOVERSION  09/16/2012   Procedure: CARDIOVERSION;  Surgeon: Sanda Klein, MD;  Location: [facility] ENDOSCOPY;  Service: Cardiovascular;  Laterality: N/A;  . COLONOSCOPY  09/20/2012   Dr. [name]: multiple tubular adenomas     . COLONOSCOPY  2005   Dr. [name]. Polyps, path unknown   . ESOPHAGOGASTRODUODENOSCOPY  09/20/2012   Dr. [name]: duodenal erosion and possible resolving ulcer at the angularis   . TEE WITHOUT CARDIOVERSION  09/16/2012   Procedure: TRANSESOPHAGEAL ECHOCARDIOGRAM (TEE);  Surgeon: Sanda Klein, MD;  Location[facility] ENDOSCOPY;  Service: Cardiovascular;  Laterality: N/A;  . TRANSESOPHAGEAL ECHOCARDIOGRAM WITH CARDIOVERSION  09/16/2012   EF 60-65%, moderate LVH, moderate regurg of the aortic valve, LA moderately dilated    SOCIAL HISTORY: Social History   Social History  . Marital status: Married    Spouse name: N/A  . Number of children: N/A  . Years of education: N/A   Occupational History  . Not on file.   Social History Main Topics  . Smoking status: Former Smoker    Packs/day: 1.00    Years: 30.00    Types: Cigarettes    Quit date: 08/29/2012  . Smokeless tobacco: Never Used     Comment: quit 08-2012  . Alcohol use No  . Drug use: No  . Sexual activity: Yes   Other Topics Concern  . Not on file   Social History Narrative  . No narrative on file    FAMILY HISTORY: Family History  Problem Relation Age of Onset  . Diabetes Brother   . Diabetes Father   . Hypertension Father   . Hypertension Sister   . Stroke Mother   . Diabetes Sister   . Hypertension Brother   . Colon cancer Neg Hx     Review of Systems  Constitutional: Negative for fever, chills, fatigue/malaise and weight loss.  HENT: Negative for congestion, hearing loss, nosebleeds, sore throat and tinnitus.   Eyes: Negative for blurred vision, double vision, pain and discharge.  Respiratory: Negative for cough, hemoptysis, sputum production, shortness of breath and wheezing.   Cardiovascular: Negative for chest pain, palpitations, claudication, leg swelling and PND.  Gastrointestinal: Positive for constipation. Negative for heartburn, nausea, vomiting, abdominal pain, diarrhea, constipation, blood in stool  and melena.  Intermittent constipation managed with milk of magnesia. Genitourinary: Negative for dysuria, urgency, frequency and hematuria.  Musculoskeletal: Positive for joint pain. Negative for myalgias and falls.  Skin: Negative for itching and rash.  Neurological: Negative for dizziness, tingling, tremors, sensory change, speech change, focal weakness, seizures, loss of consciousness, weakness and headaches.  Endo/Heme/Allergies: Does not bruise/bleed easily.  Psychiatric/Behavioral: Negative for depression, suicidal ideas, memory loss and substance abuse. The patient is not nervous/anxious and does not have insomnia.   14 point review of systems was performed and is negative except as detailed under history of present illness and above   PHYSICAL EXAMINATION  ECOG PERFORMANCE STATUS: 1 - Symptomatic but completely ambulatory  Vitals:   05/01/16 1000  BP: (!) 157/73  Pulse: 63  Resp: 18  Temp: 97.8 F (36.6 C)    Physical Exam  Constitutional: She is oriented to person, place, and time and well-developed, well-nourished, and in no distress.      Able to get on exam table with assistance. HENT:  Dentures, top and bottom. Head: Normocephalic and atraumatic.  Nose: Nose normal.  Mouth/Throat: Oropharynx is clear and moist. No oropharyngeal exudate.  Eyes: Conjunctivae and EOM are normal. Pupils are equal, round, and reactive to light. Right eye exhibits no discharge. Left eye exhibits no discharge. No scleral icterus.  Neck: Normal range of motion. Neck supple. No tracheal deviation present. No thyromegaly present.  Cardiovascular: Normal rate, regular rhythm and normal heart sounds.  Exam reveals no gallop and no friction rub.   No murmur heard. Pulmonary/Chest: Effort normal and breath sounds normal. She has no wheezes. She has no rales.  Abdominal: Soft. Bowel sounds are normal. She exhibits no distension and no mass. There is no tenderness. There is no rebound and no  guarding.  Musculoskeletal: Normal range of motion. She exhibits no edema.  Lymphadenopathy:    She has no cervical adenopathy.  Neurological: She is alert and oriented to person, place, and time. She has normal reflexes. No cranial nerve deficit. Gait normal. Coordination normal.  Skin: Skin is warm and dry. No rash noted.  Psychiatric: Mood, memory, affect and judgment normal.  Nursing note and vitals reviewed.   LABORATORY DATA: I have reviewed the data as listed. CBC    Component Value Date/Time   WBC 5.0 05/01/2016 0957   RBC 3.77 (L) 05/01/2016 0957   HGB 9.9 (L) 05/01/2016 0957   HCT 32.1 (L) 05/01/2016 0957   PLT 213 05/01/2016 0957   MCV 85.1 05/01/2016 0957   MCH 26.3 05/01/2016 0957   MCHC 30.8 05/01/2016 0957   RDW 16.9 (H) 05/01/2016 0957   LYMPHSABS 1.0 05/01/2016 0957   MONOABS 0.5 05/01/2016 0957   EOSABS 0.1 05/01/2016 0957   BASOSABS 0.0 05/01/2016 0957   CMP     Component Value Date/Time   NA 142 04/02/2016 0858   K 4.0 04/02/2016 0858   CL 102 04/02/2016 0858   CO2 20 04/02/2016 0858   GLUCOSE 91 04/02/2016 0858   GLUCOSE 121 (H) 02/20/2016 0956   BUN 64 (H) 04/02/2016 0858   CREATININE 2.29 (H) 04/02/2016 0858   CALCIUM 8.2 (L) 04/02/2016 0858   CALCIUM 7.0 (L) 03/16/2014 0835   PROT 6.8 09/19/2015 1105   ALBUMIN 3.3 (L) 09/19/2015 1105   AST 19 09/19/2015 1105   ALT 12 09/19/2015 1105   ALKPHOS 49 09/19/2015 1105   BILITOT 0.3 09/19/2015 1105   GFRNONAA 21 (L) 04/02/2016 0858   GFRAA 24 (L) 04/02/2016 0858   Results for [name], [name] (MRN [id number])   Ref. Range 02/20/2016 09:56 03/05/2016 12:50 03/19/2016 12:39 04/03/2016 14:57 04/16/2016 12:03 05/01/2016 09:57 05/15/2016 12:48 05/29/2016 12:47  Hemoglobin Latest Ref Range: 12.0 - 15.0 g/dL 10.2 (L) 10.9 (L) 10.1 (L) 9.4 (L) 9.6 (L) 9.9 (L) 9.4 (L) 9.2 (L)    ASSESSMENT and THERAPY PLAN:   Anemia Iron deficiency Lupus Stage IV Kidney disease, FSG History of heme positive stool    70 year old female with lupus and kidney disease, FSG. She has underlying anemia which is certainly multi factorial including folic acid deficiency, her chronic kidney disease, and anemia of chronic disease from her lupus.   Labs reviewed. A ferritin will be drawn after our visit today. Last IV iron was given on 03/03/2016.  Her colonoscopy scheduled on 10/24/15 was cancelled. Another colonoscopy has not been scheduled.   I have ordered hemoccults x 3, in spite of ongoing aranesp, counts have not significantly improved.   She will return for follow up in 3 months.  All questions were answered. The patient knows to call the clinic with any problems, questions or concerns. We can certainly see the patient much sooner if necessary. This note was electronically signed.  This document serves as a record of services personally performed by Ancil Linsey, MD. It was created on her behalf by Arlyce Harman, a trained medical scribe. The creation of this record is based on the scribe's personal observations and the provider's statements to them. This document has been checked and approved by the attending provider.  I have reviewed the above documentation for accuracy and completeness, and I agree with the above. Molli Hazard, MD

## 2016-05-01 NOTE — Progress Notes (Unsigned)
[name] presents today for injection per MD orders. Aranesp 125 mcg administered SQ in right Abdomen. Administration without incident. Patient tolerated well.  

## 2016-05-05 DIAGNOSIS — N189 Chronic kidney disease, unspecified: Secondary | ICD-10-CM | POA: Diagnosis not present

## 2016-05-05 DIAGNOSIS — M329 Systemic lupus erythematosus, unspecified: Secondary | ICD-10-CM | POA: Diagnosis not present

## 2016-05-05 DIAGNOSIS — M810 Age-related osteoporosis without current pathological fracture: Secondary | ICD-10-CM | POA: Diagnosis not present

## 2016-05-05 DIAGNOSIS — M150 Primary generalized (osteo)arthritis: Secondary | ICD-10-CM | POA: Diagnosis not present

## 2016-05-14 ENCOUNTER — Ambulatory Visit (INDEPENDENT_AMBULATORY_CARE_PROVIDER_SITE_OTHER): Payer: Medicare Other | Admitting: *Deleted

## 2016-05-14 DIAGNOSIS — Z5181 Encounter for therapeutic drug level monitoring: Secondary | ICD-10-CM

## 2016-05-14 DIAGNOSIS — I4892 Unspecified atrial flutter: Secondary | ICD-10-CM | POA: Diagnosis not present

## 2016-05-14 LAB — POCT INR: INR: 3.2

## 2016-05-15 ENCOUNTER — Encounter (HOSPITAL_BASED_OUTPATIENT_CLINIC_OR_DEPARTMENT_OTHER): Payer: Medicare Other

## 2016-05-15 ENCOUNTER — Encounter (HOSPITAL_COMMUNITY): Payer: Medicare Other

## 2016-05-15 VITALS — BP 157/55 | HR 69 | Temp 98.0°F | Resp 18

## 2016-05-15 DIAGNOSIS — D631 Anemia in chronic kidney disease: Secondary | ICD-10-CM

## 2016-05-15 DIAGNOSIS — N058 Unspecified nephritic syndrome with other morphologic changes: Secondary | ICD-10-CM

## 2016-05-15 DIAGNOSIS — D509 Iron deficiency anemia, unspecified: Secondary | ICD-10-CM

## 2016-05-15 DIAGNOSIS — N184 Chronic kidney disease, stage 4 (severe): Principal | ICD-10-CM

## 2016-05-15 DIAGNOSIS — R195 Other fecal abnormalities: Secondary | ICD-10-CM

## 2016-05-15 DIAGNOSIS — N189 Chronic kidney disease, unspecified: Secondary | ICD-10-CM | POA: Diagnosis not present

## 2016-05-15 DIAGNOSIS — E538 Deficiency of other specified B group vitamins: Secondary | ICD-10-CM | POA: Diagnosis not present

## 2016-05-15 DIAGNOSIS — N179 Acute kidney failure, unspecified: Secondary | ICD-10-CM | POA: Diagnosis not present

## 2016-05-15 LAB — CBC WITH DIFFERENTIAL/PLATELET
BASOS ABS: 0 10*3/uL (ref 0.0–0.1)
BASOS PCT: 0 %
EOS ABS: 0.1 10*3/uL (ref 0.0–0.7)
Eosinophils Relative: 1 %
HCT: 30.4 % — ABNORMAL LOW (ref 36.0–46.0)
HEMOGLOBIN: 9.4 g/dL — AB (ref 12.0–15.0)
LYMPHS ABS: 0.9 10*3/uL (ref 0.7–4.0)
Lymphocytes Relative: 11 %
MCH: 26.3 pg (ref 26.0–34.0)
MCHC: 30.9 g/dL (ref 30.0–36.0)
MCV: 85.2 fL (ref 78.0–100.0)
Monocytes Absolute: 0.4 10*3/uL (ref 0.1–1.0)
Monocytes Relative: 5 %
NEUTROS PCT: 83 %
Neutro Abs: 6.3 10*3/uL (ref 1.7–7.7)
PLATELETS: 269 10*3/uL (ref 150–400)
RBC: 3.57 MIL/uL — AB (ref 3.87–5.11)
RDW: 16.4 % — ABNORMAL HIGH (ref 11.5–15.5)
WBC: 7.6 10*3/uL (ref 4.0–10.5)

## 2016-05-15 LAB — FERRITIN: FERRITIN: 157 ng/mL (ref 11–307)

## 2016-05-15 LAB — OCCULT BLOOD X 1 CARD TO LAB, STOOL: FECAL OCCULT BLD: POSITIVE — AB

## 2016-05-15 MED ORDER — DARBEPOETIN ALFA 150 MCG/0.3ML IJ SOSY
PREFILLED_SYRINGE | INTRAMUSCULAR | Status: AC
  Filled 2016-05-15: qty 0.3

## 2016-05-15 MED ORDER — DARBEPOETIN ALFA 150 MCG/0.3ML IJ SOSY
125.0000 ug | PREFILLED_SYRINGE | Freq: Once | INTRAMUSCULAR | Status: AC
  Administered 2016-05-15: 125 ug via SUBCUTANEOUS

## 2016-05-15 NOTE — Patient Instructions (Signed)
[facility] at [facility] Discharge Instructions  RECOMMENDATIONS MADE BY THE CONSULTANT AND ANY TEST RESULTS WILL BE SENT TO YOUR REFERRING PHYSICIAN. Aranesp today.    Thank you for choosing [facility] at [facility] to provide your oncology and hematology care.  To afford each patient quality time with our provider, please arrive at least 15 minutes before your scheduled appointment time.   Beginning January 23rd 2017 lab work for the Cancer Center will be done in the  Main lab at [place] on 1st floor. If you have a lab appointment with the Cancer Center please come in thru the  Main Entrance and check in at the main information desk  You need to re-schedule your appointment should you arrive 10 or more minutes late.  We strive to give you quality time with our providers, and arriving late affects you and other patients whose appointments are after yours.  Also, if you no show three or more times for appointments you may be dismissed from the clinic at the providers discretion.     Again, thank you for choosing [facility].  Our hope is that these requests will decrease the amount of time that you wait before being seen by our physicians.       _____________________________________________________________  Should you have questions after your visit to [facility], please contact our office at [phone] between the hours of 8:30 a.m. and 4:30 p.m.  Voicemails left after 4:30 p.m. will not be returned until the following business day.  For prescription refill requests, have your pharmacy contact our office.         Resources For Cancer Patients and their Caregivers ? American Cancer Society: Can assist with transportation, wigs, general needs, runs Look Good Feel Better.        1-888-227-6333 ? Cancer Care: Provides financial assistance, online support groups, medication/co-pay assistance.  1-800-813-HOPE  (4673) ? [facility] Assists [place] cancer patients and their families through emotional , educational and financial support.  336-427-4357 ? [place] DSS Where to apply for food stamps, Medicaid and utility assistance. 336-342-1394 ? [facility]: Transportation to medical appointments. 336-347-2287 ? Social Security Administration: May apply for disability if have a Stage IV cancer. 336-342-7796 1-800-772-1213 ? [place] Aging, Disability and Transit Services: Assists with nutrition, care and transit needs. 336-349-2343  Cancer Center Support Programs: @[email or web address]@ > Cancer Support Group  2nd Tuesday of the month 1pm-2pm, Journey Room  > Creative Journey  3rd Tuesday of the month 1130am-1pm, Journey Room  > Look Good Feel Better  1st Wednesday of the month 10am-12 noon, Journey Room (Call American Cancer Society to register 1-800-395-5775)    

## 2016-05-15 NOTE — Addendum Note (Signed)
Addended by: Josephina Shih A on: 05/15/2016 02:42 PM   Modules accepted: Orders

## 2016-05-15 NOTE — Progress Notes (Signed)
[name] presents today for injection per MD orders. Aranesp 125 mcg administered SQ in right Abdomen. Administration without incident. Patient tolerated well.  

## 2016-05-20 DIAGNOSIS — L89892 Pressure ulcer of other site, stage 2: Secondary | ICD-10-CM | POA: Diagnosis not present

## 2016-05-20 DIAGNOSIS — E1140 Type 2 diabetes mellitus with diabetic neuropathy, unspecified: Secondary | ICD-10-CM | POA: Diagnosis not present

## 2016-05-20 DIAGNOSIS — E1151 Type 2 diabetes mellitus with diabetic peripheral angiopathy without gangrene: Secondary | ICD-10-CM | POA: Diagnosis not present

## 2016-05-22 DIAGNOSIS — Z961 Presence of intraocular lens: Secondary | ICD-10-CM | POA: Diagnosis not present

## 2016-05-22 DIAGNOSIS — E113299 Type 2 diabetes mellitus with mild nonproliferative diabetic retinopathy without macular edema, unspecified eye: Secondary | ICD-10-CM | POA: Diagnosis not present

## 2016-05-22 DIAGNOSIS — E113213 Type 2 diabetes mellitus with mild nonproliferative diabetic retinopathy with macular edema, bilateral: Secondary | ICD-10-CM | POA: Diagnosis not present

## 2016-05-27 NOTE — Progress Notes (Signed)
Cardiology Office Note  Date: 05/28/2016   ID: Dana, Little [birth year], MRN [id number]  PCP: [name], MD  Primary Cardiologist: Rozann Lesches, MD   Chief Complaint  Patient presents with  . Atrial Flutter    History of Present Illness: [name] is a 70 y.o. female last seen in March. She presents with her husband for a follow-up visit. Continues to do well without any significant palpitations or chest pain. She has done well without obvious recurrent atrial flutter on beta blocker.  She continues on Coumadin with follow-up in anticoagulation clinic. No reported unusual bleeding problems. Most recent INR was 3.2.  Past Medical History:  Diagnosis Date  . Anemia   . Asthma   . Atrial flutter [facility])    TEE/DCCV December 2013 - on Xarelto [facility])  . Bone spur of toe    Right 5th toe  . CKD (chronic kidney disease) stage 3, GFR 30-59 ml/min   . DVT of upper extremity (deep vein thrombosis) ([place])    Right basilic vein, June 123456  . Essential hypertension, benign   . SLE (systemic lupus erythematosus) ([place])   . Type 2 diabetes mellitus ([place])   . UTI (lower urinary tract infection)     Current Outpatient Prescriptions  Medication Sig Dispense Refill  . acetaminophen (TYLENOL) 500 MG tablet Take 1,000 mg by mouth every 6 (six) hours as needed. For pain    . albuterol (ACCUNEB) 0.63 MG/3ML nebulizer solution 1 ampule.    Marland Kitchen albuterol (PROVENTIL HFA;VENTOLIN HFA) 108 (90 BASE) MCG/ACT inhaler Inhale 2 puffs into the lungs every 6 (six) hours as needed. For shortness of breath    . amLODipine (NORVASC) 10 MG tablet Take 1 tablet (10 mg total) by mouth at bedtime. 30 tablet 0  . atorvastatin (LIPITOR) 40 MG tablet Take 40 mg by mouth daily. 1/2 tablet 20 mg    . brimonidine-timolol (COMBIGAN) 0.2-0.5 % ophthalmic solution Apply 1 drop to eye daily.     . calcitRIOL (ROCALTROL) 0.25 MCG capsule Take 0.25 mcg by mouth as directed. Take on Mondays  Wednesdays and Fridays    . diclofenac (VOLTAREN) 0.1 % ophthalmic solution Place 1 drop into the left eye daily. Uses at lunch time    . febuxostat (ULORIC) 40 MG tablet Take 40 mg by mouth daily.    . folic acid (FOLVITE) 1 MG tablet Take 1 mg by mouth daily.    . furosemide (LASIX) 80 MG tablet Take 80 mg by mouth 2 (two) times daily. Take 2 in the AM and 2 in the Evening    . HYDROcodone-acetaminophen (NORCO/VICODIN) 5-325 MG per tablet Take 1-2 tablets by mouth every 4 (four) hours as needed for moderate pain. 10 tablet 0  . hydroxychloroquine (PLAQUENIL) 200 MG tablet Take 200 mg by mouth 2 (two) times daily.    . insulin aspart (NOVOLOG) 100 UNIT/ML injection Inject 5-11 Units into the skin 3 (three) times daily with meals.    . Insulin Detemir (LEVEMIR) 100 UNIT/ML Pen Inject 12 Units into the skin at bedtime. 15 mL 2  . insulin lispro (HUMALOG KWIKPEN) 100 UNIT/ML KiwkPen Up to 11 units TIDAC 15 mL 2  . metoprolol (LOPRESSOR) 50 MG tablet TAKE ONE TABLET BY MOUTH TWICE DAILY. 60 tablet 6  . Multiple Vitamins-Minerals (CENTRUM SILVER PO) Take 1 tablet by mouth daily.    . ONE TOUCH ULTRA TEST test strip TEST 4 TIMES A DAY AS DIRECTED. 150 each 5  .  ONETOUCH DELICA LANCETS 99991111 MISC USE TO TEST 4 TIMES DAILY. 100 each 5  . polyethylene glycol-electrolytes (NULYTELY/GOLYTELY) 420 g solution Take 4,000 mLs by mouth once. 4000 mL 0  . predniSONE (DELTASONE) 20 MG tablet Take 5 mg by mouth as directed. Pt takes 10 mg and 5mg  alternating days    . travoprost, benzalkonium, (TRAVATAN) 0.004 % ophthalmic solution Place 1 drop into the left eye at bedtime.    . Vitamin D, Ergocalciferol, (DRISDOL) 50000 units CAPS capsule TAKE 1 CAPSULE BY MOUTH ONCE WEEKLY. 12 capsule 0  . warfarin (COUMADIN) 5 MG tablet Take 1 1/2 tablets daily except 1 tablet on Thursdays (Patient taking differently: Take 5 mg by mouth as directed. Take 1 1/2 tablets daily except 1 tablet on Thursdays, Tuesday, and Saturday) 180  tablet 3   No current facility-administered medications for this visit.    Allergies:  Ace inhibitors   Social History: The patient  reports that she quit smoking about 3 years ago. Her smoking use included Cigarettes. She has a 30.00 pack-year smoking history. She has never used smokeless tobacco. She reports that she does not drink alcohol or use drugs.   ROS:  Please see the history of present illness. Otherwise, complete review of systems is positive for gout flares.  All other systems are reviewed and negative.   Physical Exam: VS:  BP (!) 144/64   Pulse 78   Ht 5\' 5"  (1.651 m)   Wt 214 lb (97.1 kg)   SpO2 97%   BMI 35.61 kg/m , BMI Body mass index is 35.61 kg/m.  Wt Readings from Last 3 Encounters:  05/28/16 214 lb (97.1 kg)  05/01/16 214 lb 12.8 oz (97.4 kg)  04/09/16 215 lb (97.5 kg)    Obese woman, appears comfortable at rest.  HEENT: Conjunctiva and lids normal, oropharynx clear.  Neck: Supple, no elevated JVP or carotid bruits, no thyromegaly.  Lungs: Clear to auscultation, nonlabored breathing at rest.  Cardiac: Regular rate and rhythm, no S3, 2/6 systolic murmur with soft diastolic murmur, no pericardial rub.  Abdomen: Soft, nontender, bowel sounds present, no guarding or rebound.  Extremities: 1+ edema, distal pulses 2+.   ECG: I personally reviewed the tracing from 03/19/2016 which showed sinus rhythm with LVH and probable repolarization abnormalities.  Recent Labwork: 09/19/2015: ALT 12; AST 19; TSH 2.360 04/02/2016: BUN 64; Creatinine, Ser 2.29; Potassium 4.0; Sodium 142 05/15/2016: Hemoglobin 9.4; Platelets 269   Other Studies Reviewed Today:  Echocardiogram 03/14/2014: Study Conclusions  - Left ventricle: The cavity size was normal. Wall thickness was increased increased in a pattern of mild to moderate LVH. Systolic function was normal. The estimated ejection fraction was in the range of 50% to 55%. Doppler parameters are consistent with  an irreversible restrictive pattern, indicative of decreased left ventricular diastolic compliance and/or increased left atrial pressure (grade 4 diastolic dysfunction). - Aortic valve: Mildly calcified annulus. Trileaflet; mildly thickened leaflets. There was moderate regurgitation. Regurgitation pressure half-time: 324 ms. - Aortic root: The aortic root was normal in size. - Mitral valve: Mildly calcified annulus. Moderately thickened leaflets . There was mild regurgitation. - Left atrium: The atrium was moderately dilated. - Pulmonary veins: There is systolic blunting of pulmonary vein flow suggestive of elevated LA pressure. - Right ventricle: The cavity size was mildly to moderately dilated. - Right atrium: The atrium was moderately dilated. - Tricuspid valve: There was moderate-severe regurgitation. The jet is eccentric and posterior, this may lead to underestimation of severity. The  TR vena contracta measures 0.4 cm. There is systolic reversal of hepatic vein flow suggestive of severe TR. The morphological etiology of the TR is unclear from available images. - Pulmonary arteries: Systolic pressure was moderately increased. PA peak pressure: 61 mm Hg (S). Pulmonary pressure may be underestimated in the setting of significant TR. - Inferior vena cava: The vessel was dilated. The respirophasic diameter changes were blunted (< 50%), consistent with elevated central venous pressure. - Technically adequate study.  Assessment and Plan:  1. History of atrial flutter, quiescent on current regimen. We continue Coumadin for stroke prophylaxis with CHADSVASC score of 4.  2. Essential hypertension, no changes made to current regimen. Keep follow-up with Dr. Hilma Favors.  Current medicines were reviewed with the patient today.  Disposition: Follow-up with me in 6 months.  Signed, Satira Sark, MD, [facility] 05/28/2016 10:26 AM    [place] at [facility] [address] [place], [place] [address] Phone: [phone]; Fax: [phone]

## 2016-05-28 ENCOUNTER — Encounter: Payer: Self-pay | Admitting: Cardiology

## 2016-05-28 ENCOUNTER — Ambulatory Visit (INDEPENDENT_AMBULATORY_CARE_PROVIDER_SITE_OTHER): Payer: Medicare Other | Admitting: Cardiology

## 2016-05-28 VITALS — BP 144/64 | HR 78 | Ht 65.0 in | Wt 214.0 lb

## 2016-05-28 DIAGNOSIS — I10 Essential (primary) hypertension: Secondary | ICD-10-CM | POA: Diagnosis not present

## 2016-05-28 DIAGNOSIS — I4892 Unspecified atrial flutter: Secondary | ICD-10-CM

## 2016-05-28 NOTE — Patient Instructions (Signed)

## 2016-05-29 ENCOUNTER — Encounter (HOSPITAL_COMMUNITY): Payer: Self-pay

## 2016-05-29 ENCOUNTER — Encounter (HOSPITAL_COMMUNITY): Payer: Medicare Other

## 2016-05-29 ENCOUNTER — Encounter (HOSPITAL_BASED_OUTPATIENT_CLINIC_OR_DEPARTMENT_OTHER): Payer: Medicare Other

## 2016-05-29 VITALS — BP 147/53 | HR 66 | Temp 97.8°F | Resp 18

## 2016-05-29 DIAGNOSIS — D631 Anemia in chronic kidney disease: Secondary | ICD-10-CM | POA: Diagnosis not present

## 2016-05-29 DIAGNOSIS — N058 Unspecified nephritic syndrome with other morphologic changes: Secondary | ICD-10-CM

## 2016-05-29 DIAGNOSIS — N184 Chronic kidney disease, stage 4 (severe): Principal | ICD-10-CM

## 2016-05-29 DIAGNOSIS — N189 Chronic kidney disease, unspecified: Secondary | ICD-10-CM | POA: Diagnosis not present

## 2016-05-29 DIAGNOSIS — E538 Deficiency of other specified B group vitamins: Secondary | ICD-10-CM | POA: Diagnosis not present

## 2016-05-29 DIAGNOSIS — N179 Acute kidney failure, unspecified: Secondary | ICD-10-CM | POA: Diagnosis not present

## 2016-05-29 DIAGNOSIS — R195 Other fecal abnormalities: Secondary | ICD-10-CM

## 2016-05-29 DIAGNOSIS — D509 Iron deficiency anemia, unspecified: Secondary | ICD-10-CM

## 2016-05-29 LAB — CBC WITH DIFFERENTIAL/PLATELET
BASOS ABS: 0 10*3/uL (ref 0.0–0.1)
BASOS PCT: 0 %
EOS PCT: 1 %
Eosinophils Absolute: 0.1 10*3/uL (ref 0.0–0.7)
HCT: 29.6 % — ABNORMAL LOW (ref 36.0–46.0)
Hemoglobin: 9.2 g/dL — ABNORMAL LOW (ref 12.0–15.0)
LYMPHS PCT: 9 %
Lymphs Abs: 0.7 10*3/uL (ref 0.7–4.0)
MCH: 26.4 pg (ref 26.0–34.0)
MCHC: 31.1 g/dL (ref 30.0–36.0)
MCV: 84.8 fL (ref 78.0–100.0)
Monocytes Absolute: 0.2 10*3/uL (ref 0.1–1.0)
Monocytes Relative: 3 %
Neutro Abs: 6.1 10*3/uL (ref 1.7–7.7)
Neutrophils Relative %: 87 %
PLATELETS: 211 10*3/uL (ref 150–400)
RBC: 3.49 MIL/uL — AB (ref 3.87–5.11)
RDW: 16.6 % — ABNORMAL HIGH (ref 11.5–15.5)
WBC: 7 10*3/uL (ref 4.0–10.5)

## 2016-05-29 LAB — FERRITIN: FERRITIN: 144 ng/mL (ref 11–307)

## 2016-05-29 MED ORDER — DARBEPOETIN ALFA 150 MCG/0.3ML IJ SOSY
PREFILLED_SYRINGE | INTRAMUSCULAR | Status: AC
  Filled 2016-05-29: qty 0.3

## 2016-05-29 MED ORDER — DARBEPOETIN ALFA 150 MCG/0.3ML IJ SOSY
125.0000 ug | PREFILLED_SYRINGE | Freq: Once | INTRAMUSCULAR | Status: AC
  Administered 2016-05-29: 125 ug via SUBCUTANEOUS

## 2016-05-29 NOTE — Patient Instructions (Signed)
[place] at [facility] Discharge Instructions  RECOMMENDATIONS MADE BY THE CONSULTANT AND ANY TEST RESULTS WILL BE SENT TO YOUR REFERRING PHYSICIAN.  You were given Aranesp injection today.  Return as scheduled.   Thank you for choosing [place] at [facility] to provide your oncology and hematology care.  To afford each patient quality time with our provider, please arrive at least 15 minutes before your scheduled appointment time.   Beginning January 23rd 2017 lab work for the [facility] will be done in the  Main lab at [facility] on 1st floor. If you have a lab appointment with the [facility] please come in thru the  Main Entrance and check in at the main information desk  You need to re-schedule your appointment should you arrive 10 or more minutes late.  We strive to give you quality time with our providers, and arriving late affects you and other patients whose appointments are after yours.  Also, if you no show three or more times for appointments you may be dismissed from the clinic at the providers discretion.     Again, thank you for choosing [facility].  Our hope is that these requests will decrease the amount of time that you wait before being seen by our physicians.       _____________________________________________________________  Should you have questions after your visit to [facility], please contact our office at [phone] between the hours of 8:30 a.m. and 4:30 p.m.  Voicemails left after 4:30 p.m. will not be returned until the following business day.  For prescription refill requests, have your pharmacy contact our office.         Resources For Cancer Patients and their Caregivers ? American Cancer Society: Can assist with transportation, wigs, general needs, runs Look Good Feel Better.        ([phone] ? Cancer Care: Provides financial assistance, online support groups,  medication/co-pay assistance.  1-800-813-HOPE 312-303-1685) ? [place] Assists [place] cancer patients and their families through emotional , educational and financial support.  ([phone] ? [place] DSS Where to apply for food stamps, Medicaid and utility assistance. 530-304-0543 ? RCATS: Transportation to medical appointments. ([phone] ? Social Security Administration: May apply for disability if have a Stage IV cancer. 731-212-6461 629-419-8778 ? [employer], Disability and Transit Services: Assists with nutrition, care and transit needs. [place] Support Programs: @10RELATIVEDAYS @ > Cancer Support Group  2nd Tuesday of the month 1pm-2pm, Journey Room  > Creative Journey  3rd Tuesday of the month 1130am-1pm, Journey Room  > Look Good Feel Better  1st Wednesday of the month 10am-12 noon, Journey Room (Call [place] to register [phone])

## 2016-05-29 NOTE — Progress Notes (Signed)
Pt here today for Aranesp injection. Pt BP slightly elevated, spoke with Kirby Crigler, he advised to go ahead with the injection. Pt given Aranesp and tolerated well. PT to return as scheduled.

## 2016-06-05 ENCOUNTER — Other Ambulatory Visit: Payer: Self-pay | Admitting: "Endocrinology

## 2016-06-09 ENCOUNTER — Ambulatory Visit (INDEPENDENT_AMBULATORY_CARE_PROVIDER_SITE_OTHER): Payer: Medicare Other | Admitting: *Deleted

## 2016-06-09 DIAGNOSIS — I4892 Unspecified atrial flutter: Secondary | ICD-10-CM

## 2016-06-09 DIAGNOSIS — Z5181 Encounter for therapeutic drug level monitoring: Secondary | ICD-10-CM | POA: Diagnosis not present

## 2016-06-09 LAB — POCT INR: INR: 3.6

## 2016-06-12 ENCOUNTER — Encounter (HOSPITAL_COMMUNITY): Payer: Medicare Other

## 2016-06-12 ENCOUNTER — Encounter (HOSPITAL_COMMUNITY): Payer: Medicare Other | Attending: Hematology & Oncology

## 2016-06-12 ENCOUNTER — Encounter (HOSPITAL_COMMUNITY): Payer: Self-pay

## 2016-06-12 VITALS — BP 154/42 | HR 60 | Temp 97.9°F | Resp 18

## 2016-06-12 DIAGNOSIS — D631 Anemia in chronic kidney disease: Secondary | ICD-10-CM | POA: Diagnosis not present

## 2016-06-12 DIAGNOSIS — N184 Chronic kidney disease, stage 4 (severe): Secondary | ICD-10-CM | POA: Diagnosis not present

## 2016-06-12 DIAGNOSIS — N189 Chronic kidney disease, unspecified: Secondary | ICD-10-CM

## 2016-06-12 DIAGNOSIS — D509 Iron deficiency anemia, unspecified: Secondary | ICD-10-CM | POA: Diagnosis not present

## 2016-06-12 DIAGNOSIS — R195 Other fecal abnormalities: Secondary | ICD-10-CM

## 2016-06-12 DIAGNOSIS — N058 Unspecified nephritic syndrome with other morphologic changes: Secondary | ICD-10-CM

## 2016-06-12 LAB — CBC WITH DIFFERENTIAL/PLATELET
BASOS PCT: 0 %
Basophils Absolute: 0 10*3/uL (ref 0.0–0.1)
EOS ABS: 0.1 10*3/uL (ref 0.0–0.7)
EOS PCT: 2 %
HCT: 28.6 % — ABNORMAL LOW (ref 36.0–46.0)
Hemoglobin: 8.8 g/dL — ABNORMAL LOW (ref 12.0–15.0)
LYMPHS ABS: 0.9 10*3/uL (ref 0.7–4.0)
Lymphocytes Relative: 17 %
MCH: 26.2 pg (ref 26.0–34.0)
MCHC: 30.8 g/dL (ref 30.0–36.0)
MCV: 85.1 fL (ref 78.0–100.0)
MONOS PCT: 6 %
Monocytes Absolute: 0.3 10*3/uL (ref 0.1–1.0)
Neutro Abs: 4.2 10*3/uL (ref 1.7–7.7)
Neutrophils Relative %: 75 %
PLATELETS: 260 10*3/uL (ref 150–400)
RBC: 3.36 MIL/uL — AB (ref 3.87–5.11)
RDW: 16.3 % — ABNORMAL HIGH (ref 11.5–15.5)
WBC: 5.6 10*3/uL (ref 4.0–10.5)

## 2016-06-12 LAB — FERRITIN: FERRITIN: 125 ng/mL (ref 11–307)

## 2016-06-12 MED ORDER — DARBEPOETIN ALFA 150 MCG/0.3ML IJ SOSY
125.0000 ug | PREFILLED_SYRINGE | Freq: Once | INTRAMUSCULAR | Status: AC
  Administered 2016-06-12: 125 ug via SUBCUTANEOUS
  Filled 2016-06-12: qty 0.3

## 2016-06-23 ENCOUNTER — Ambulatory Visit (INDEPENDENT_AMBULATORY_CARE_PROVIDER_SITE_OTHER): Payer: Medicare Other | Admitting: *Deleted

## 2016-06-23 DIAGNOSIS — Z5181 Encounter for therapeutic drug level monitoring: Secondary | ICD-10-CM | POA: Diagnosis not present

## 2016-06-23 DIAGNOSIS — I4892 Unspecified atrial flutter: Secondary | ICD-10-CM | POA: Diagnosis not present

## 2016-06-23 LAB — POCT INR: INR: 2.4

## 2016-06-24 DIAGNOSIS — L89892 Pressure ulcer of other site, stage 2: Secondary | ICD-10-CM | POA: Diagnosis not present

## 2016-06-24 DIAGNOSIS — E1151 Type 2 diabetes mellitus with diabetic peripheral angiopathy without gangrene: Secondary | ICD-10-CM | POA: Diagnosis not present

## 2016-06-24 DIAGNOSIS — E1140 Type 2 diabetes mellitus with diabetic neuropathy, unspecified: Secondary | ICD-10-CM | POA: Diagnosis not present

## 2016-06-25 ENCOUNTER — Telehealth: Payer: Self-pay | Admitting: Gastroenterology

## 2016-06-25 ENCOUNTER — Ambulatory Visit (INDEPENDENT_AMBULATORY_CARE_PROVIDER_SITE_OTHER): Payer: Medicare Other | Admitting: Gastroenterology

## 2016-06-25 ENCOUNTER — Encounter: Payer: Self-pay | Admitting: Gastroenterology

## 2016-06-25 VITALS — BP 146/68 | HR 65 | Temp 97.7°F | Ht 65.0 in | Wt 210.6 lb

## 2016-06-25 DIAGNOSIS — N184 Chronic kidney disease, stage 4 (severe): Secondary | ICD-10-CM | POA: Diagnosis not present

## 2016-06-25 DIAGNOSIS — D631 Anemia in chronic kidney disease: Secondary | ICD-10-CM

## 2016-06-25 NOTE — Progress Notes (Signed)
CC'ED

## 2016-06-25 NOTE — Progress Notes (Signed)
Referring Provider: Sharilyn Sites, MD Primary Care Physician:  [name], MD  Chief Complaint  Patient presents with  . Blood In Stools    + stool card, no other problems    HPI:   [name] is a 70 y.o. female presenting today with a history of heme positive stool. She has been followed for anemia in chronic renal disease. Hgb has been slowly drifting. She is on chronic Coumadin, with history of an upper extremity DVT in June 2015, history of aflutter. On Prednisone chronically. Receives Aranesp injections. Last colonoscopy/EGD in 2013 by Dr. [name], performed due to anemia while hospitalized, noting multiple tubular adenomas and EGD with duodenal erosion and possible resolving ulcer at the angularis. She was last seen in Jan 2017, whereby a colonoscopy +/- EGD was arranged due to heme positive stool. They cancelled this due to concern for not eating prior to the colonoscopy.   Muscles/bones hurt all the time. No GI problems. No N/V, abdominal pain, rectal bleeding, constipation, diarrhea. Multiple reasons for not wanting to do the colonoscopy: bathroom is upstairs. Doesn't want to take the prep. Adjusting the medications. Received iron infusion in May. After further discussion with husband, patient understands importance of proceeding with colonoscopy. She is agreeable to proceeding.   Past Medical History:  Diagnosis Date  . Anemia   . Asthma   . Atrial flutter [facility])    TEE/DCCV December 2013 - on Xarelto [facility])  . Bone spur of toe    Right 5th toe  . CKD (chronic kidney disease) stage 3, GFR 30-59 ml/min   . DVT of upper extremity (deep vein thrombosis) ([place])    Right basilic vein, June 0300  . Essential hypertension, benign   . SLE (systemic lupus erythematosus) ([place])   . Type 2 diabetes mellitus ([place])   . UTI (lower urinary tract infection)     Past Surgical History:  Procedure Laterality Date  . ABDOMINAL HYSTERECTOMY  1983  . ANKLE SURGERY  1993    . AV FISTULA PLACEMENT Left 03/27/2014   Procedure: ARTERIOVENOUS (AV) FISTULA CREATION;  Surgeon: Rosetta Posner, MD;  Location: [facility];  Service: Vascular;  Laterality: Left;  . BACK SURGERY  1980  . CARDIOVASCULAR STRESS TEST  12/19/2009   no stress induced rhythm abnormalities, ekg negative for ischemia  . CARDIOVERSION  09/16/2012   Procedure: CARDIOVERSION;  Surgeon: Sanda Klein, MD;  Location[facility] ENDOSCOPY;  Service: Cardiovascular;  Laterality: N/A;  . COLONOSCOPY  09/20/2012   Dr. [name]: multiple tubular adenomas   . COLONOSCOPY  2005   Dr. [name]. Polyps, path unknown   . ESOPHAGOGASTRODUODENOSCOPY  09/20/2012   Dr. [name]: duodenal erosion and possible resolving ulcer at the angularis   . TEE WITHOUT CARDIOVERSION  09/16/2012   Procedure: TRANSESOPHAGEAL ECHOCARDIOGRAM (TEE);  Surgeon: Sanda Klein, MD;  Location: [facility] ENDOSCOPY;  Service: Cardiovascular;  Laterality: N/A;  . TRANSESOPHAGEAL ECHOCARDIOGRAM WITH CARDIOVERSION  09/16/2012   EF 60-65%, moderate LVH, moderate regurg of the aortic valve, LA moderately dilated    Current Outpatient Prescriptions  Medication Sig Dispense Refill  . acetaminophen (TYLENOL) 500 MG tablet Take 1,000 mg by mouth every 6 (six) hours as needed. For pain    . albuterol (ACCUNEB) 0.63 MG/3ML nebulizer solution 1 ampule.    Marland Kitchen albuterol (PROVENTIL HFA;VENTOLIN HFA) 108 (90 BASE) MCG/ACT inhaler Inhale 2 puffs into the lungs every 6 (six) hours as needed. For shortness of breath    . amLODipine (NORVASC) 10  MG tablet Take 1 tablet (10 mg total) by mouth at bedtime. 30 tablet 0  . atorvastatin (LIPITOR) 40 MG tablet Take 40 mg by mouth daily. 1/2 tablet 20 mg    . brimonidine-timolol (COMBIGAN) 0.2-0.5 % ophthalmic solution Apply 1 drop to eye daily.     . calcitRIOL (ROCALTROL) 0.25 MCG capsule Take 0.25 mcg by mouth as directed. Take on Mondays Wednesdays and Fridays    . diclofenac (VOLTAREN) 0.1 % ophthalmic solution Place 1 drop into the  left eye daily. Uses at lunch time    . febuxostat (ULORIC) 40 MG tablet Take 40 mg by mouth daily.    . folic acid (FOLVITE) 1 MG tablet Take 1 mg by mouth daily.    . furosemide (LASIX) 80 MG tablet Take 80 mg by mouth 2 (two) times daily. Take 2 in the AM and 2 in the Evening    . HYDROcodone-acetaminophen (NORCO/VICODIN) 5-325 MG per tablet Take 1-2 tablets by mouth every 4 (four) hours as needed for moderate pain. 10 tablet 0  . hydroxychloroquine (PLAQUENIL) 200 MG tablet Take 200 mg by mouth 2 (two) times daily.    . insulin aspart (NOVOLOG) 100 UNIT/ML injection Inject 5-11 Units into the skin 3 (three) times daily with meals.    . Insulin Detemir (LEVEMIR) 100 UNIT/ML Pen Inject 12 Units into the skin at bedtime. 15 mL 2  . metoprolol (LOPRESSOR) 50 MG tablet TAKE ONE TABLET BY MOUTH TWICE DAILY. 60 tablet 6  . Multiple Vitamins-Minerals (CENTRUM SILVER PO) Take 1 tablet by mouth daily.    . ONE TOUCH ULTRA TEST test strip TEST 4 TIMES A DAY AS DIRECTED. 150 each 5  . ONETOUCH DELICA LANCETS 56E MISC USE TO TEST 4 TIMES DAILY. 100 each 5  . predniSONE (DELTASONE) 20 MG tablet Take 5 mg by mouth as directed. Pt takes 10 mg and 5mg  alternating days    . travoprost, benzalkonium, (TRAVATAN) 0.004 % ophthalmic solution Place 1 drop into the left eye at bedtime.    Marland Kitchen UNIFINE PENTIPS 31G X 6 MM MISC USE AS DIRECTED AT BEDTIME WITH LEVEMIR AND WITH SLIDING SCALE INSULIN. 100 each 5  . Vitamin D, Ergocalciferol, (DRISDOL) 50000 units CAPS capsule TAKE 1 CAPSULE BY MOUTH ONCE WEEKLY. 12 capsule 0  . warfarin (COUMADIN) 5 MG tablet Take 1 1/2 tablets daily except 1 tablet on Thursdays (Patient taking differently: Take 5 mg by mouth as directed. Takes 1 1/2 Monday, Wednesday, Friday and 1 tablet all other days.) 180 tablet 3  . insulin lispro (HUMALOG KWIKPEN) 100 UNIT/ML KiwkPen Up to 11 units [facility] (Patient not taking: Reported on 06/25/2016) 15 mL 2  . polyethylene glycol-electrolytes  (NULYTELY/GOLYTELY) 420 g solution Take 4,000 mLs by mouth once. (Patient not taking: Reported on 06/25/2016) 4000 mL 0   No current facility-administered medications for this visit.     Allergies as of 06/25/2016 - Review Complete 06/25/2016  Allergen Reaction Noted  . Ace inhibitors Cough 06/15/2015    Family History  Problem Relation Age of Onset  . Diabetes Brother   . Diabetes Father   . Hypertension Father   . Hypertension Sister   . Stroke Mother   . Diabetes Sister   . Hypertension Brother   . Colon cancer Neg Hx     Social History   Social History  . Marital status: Married    Spouse name: N/A  . Number of children: N/A  . Years of education: N/A  Social History Main Topics  . Smoking status: Former Smoker    Packs/day: 1.00    Years: 30.00    Types: Cigarettes    Quit date: 08/29/2012  . Smokeless tobacco: Never Used     Comment: quit 08-2012  . Alcohol use No  . Drug use: No  . Sexual activity: Yes    Partners: Male   Other Topics Concern  . None   Social History Narrative  . None    Review of Systems: As mentioned in HPI   Physical Exam: BP (!) 146/68   Pulse 65   Temp 97.7 F (36.5 C) (Oral)   Ht 5\' 5"  (1.651 m)   Wt 210 lb 9.6 oz (95.5 kg)   BMI 35.05 kg/m  General:   Alert and oriented. No distress noted. Pleasant and cooperative.  Head:  Normocephalic and atraumatic. Eyes:  Conjuctiva clear without scleral icterus. Mouth:  Oral mucosa pink and moist.  Heart:  S1, S2 present with soft murmur  Abdomen:  +BS, soft, non-tender and non-distended. No rebound or guarding. No HSM or masses noted. Msk:  Symmetrical without gross deformities. Normal posture. Extremities:  Without edema. Neurologic:  Alert and  oriented x4;  grossly normal neurologically. Psych:  Alert and cooperative. Normal mood and affect.  Lab Results  Component Value Date   WBC 5.6 06/12/2016   HGB 8.8 (L) 06/12/2016   HCT 28.6 (L) 06/12/2016   MCV 85.1 06/12/2016    PLT 260 06/12/2016   Lab Results  Component Value Date   IRON 44 02/12/2015   TIBC 272 02/12/2015   FERRITIN 125 06/12/2016

## 2016-06-25 NOTE — Telephone Encounter (Signed)
Lattie Haw, is it still ok to hold Coumadin X 4 days without bridging Lovenox? Patient is scheduled for colonoscopy/EGD in near future. We had gotten the go ahead for this Jan 2017, but I wanted to make sure her condition hadn't changed. Thanks! Annitta Needs, ANP-BC [facility]

## 2016-06-25 NOTE — Patient Instructions (Signed)
We have scheduled you for a colonoscopy and possible upper endoscopy with Dr. [name].  DO NOT TAKE COUMADIN FOR 4 DAYS PRIOR TO THE PROCEDURE.  ONLY TAKE 6 UNITS OF YOUR INSULIN THE NIGHT BEFORE THE PROCEDURE.  DO NOT TAKE ANY INSULIN THE MORNING OF THE PROCEDURE.

## 2016-06-25 NOTE — Assessment & Plan Note (Signed)
70-year-female with history of anemia but drifting Hgb, on Aranesp, requiring IV iron earlier this year. Likely anemia is secondary to known chronic renal disease. However, she is on chronic Coumadin and has had evidence of heme positive stool on several prior occasions. No overt GI bleeding or any concerning GI symptoms. She also takes Prednisone chronically. With drifting Hgb and heme positive stool, deserves early interval colonoscopy+/- EGD with Dr. [name]. Last TCS/EGD in 2013 by Dr. [name] with multiple tubular adenomas, EGD with duodenal erosion and possible resolving ulcer at angularis. As of note, she cancelled endoscopic procedures previously scheduled in Jan 2017 due to concern for insulin dosing and eating schedule. I spent a significant amount of time discussing the importance with her.   Proceed with TCS +/- EGD with Dr. [name] in near future: the risks, benefits, and alternatives have been discussed with the patient in detail. The patient states understanding and desires to proceed. HOLD COUMADIN X 4 days prior. NO NEED FOR BRIDGING PER CARDIOLOGY (this was addressed earlier this year).  Consider capsule study if negative TCS/EGD.

## 2016-06-25 NOTE — Progress Notes (Signed)
CC'ED TO PCP 

## 2016-06-26 ENCOUNTER — Encounter (HOSPITAL_COMMUNITY): Payer: Medicare Other

## 2016-06-26 ENCOUNTER — Encounter (HOSPITAL_COMMUNITY): Payer: Self-pay

## 2016-06-26 ENCOUNTER — Encounter (HOSPITAL_BASED_OUTPATIENT_CLINIC_OR_DEPARTMENT_OTHER): Payer: Medicare Other

## 2016-06-26 VITALS — BP 150/59 | HR 68 | Temp 98.6°F

## 2016-06-26 DIAGNOSIS — N184 Chronic kidney disease, stage 4 (severe): Secondary | ICD-10-CM

## 2016-06-26 DIAGNOSIS — D631 Anemia in chronic kidney disease: Secondary | ICD-10-CM

## 2016-06-26 DIAGNOSIS — N058 Unspecified nephritic syndrome with other morphologic changes: Secondary | ICD-10-CM

## 2016-06-26 DIAGNOSIS — D509 Iron deficiency anemia, unspecified: Secondary | ICD-10-CM

## 2016-06-26 DIAGNOSIS — N189 Chronic kidney disease, unspecified: Secondary | ICD-10-CM

## 2016-06-26 DIAGNOSIS — R195 Other fecal abnormalities: Secondary | ICD-10-CM

## 2016-06-26 LAB — CBC WITH DIFFERENTIAL/PLATELET
Basophils Absolute: 0 10*3/uL (ref 0.0–0.1)
Basophils Relative: 0 %
Eosinophils Absolute: 0.1 10*3/uL (ref 0.0–0.7)
Eosinophils Relative: 2 %
HEMATOCRIT: 31.6 % — AB (ref 36.0–46.0)
HEMOGLOBIN: 9.8 g/dL — AB (ref 12.0–15.0)
LYMPHS PCT: 17 %
Lymphs Abs: 1.1 10*3/uL (ref 0.7–4.0)
MCH: 26.3 pg (ref 26.0–34.0)
MCHC: 31 g/dL (ref 30.0–36.0)
MCV: 84.9 fL (ref 78.0–100.0)
MONO ABS: 0.5 10*3/uL (ref 0.1–1.0)
MONOS PCT: 8 %
NEUTROS ABS: 4.7 10*3/uL (ref 1.7–7.7)
Neutrophils Relative %: 73 %
Platelets: 229 10*3/uL (ref 150–400)
RBC: 3.72 MIL/uL — ABNORMAL LOW (ref 3.87–5.11)
RDW: 16.6 % — AB (ref 11.5–15.5)
WBC: 6.5 10*3/uL (ref 4.0–10.5)

## 2016-06-26 LAB — FERRITIN: Ferritin: 110 ng/mL (ref 11–307)

## 2016-06-26 MED ORDER — DARBEPOETIN ALFA 150 MCG/0.3ML IJ SOSY
PREFILLED_SYRINGE | INTRAMUSCULAR | Status: AC
  Filled 2016-06-26: qty 0.3

## 2016-06-26 MED ORDER — DARBEPOETIN ALFA 150 MCG/0.3ML IJ SOSY
125.0000 ug | PREFILLED_SYRINGE | Freq: Once | INTRAMUSCULAR | Status: AC
  Administered 2016-06-26: 125 ug via SUBCUTANEOUS

## 2016-06-26 NOTE — Progress Notes (Signed)
[name] presents today for injection per MD orders. Aranesp 175mcg administered SQ in left Abdomen. Administration without incident. Patient tolerated well. Vitals stable, discharged home via wheelchair with husband at her side.

## 2016-06-26 NOTE — Patient Instructions (Signed)
[place] at [facility] Discharge Instructions  RECOMMENDATIONS MADE BY THE CONSULTANT AND ANY TEST RESULTS WILL BE SENT TO YOUR REFERRING PHYSICIAN.  Aranesp given today per orders. Follow up as scheduled.  Thank you for choosing [place] at [facility] to provide your oncology and hematology care.  To afford each patient quality time with our provider, please arrive at least 15 minutes before your scheduled appointment time.   Beginning January 23rd 2017 lab work for the [facility] will be done in the  Main lab at [facility] on 1st floor. If you have a lab appointment with the [facility] please come in thru the  Main Entrance and check in at the main information desk  You need to re-schedule your appointment should you arrive 10 or more minutes late.  We strive to give you quality time with our providers, and arriving late affects you and other patients whose appointments are after yours.  Also, if you no show three or more times for appointments you may be dismissed from the clinic at the providers discretion.     Again, thank you for choosing [facility].  Our hope is that these requests will decrease the amount of time that you wait before being seen by our physicians.       _____________________________________________________________  Should you have questions after your visit to [facility], please contact our office at [phone] between the hours of 8:30 a.m. and 4:30 p.m.  Voicemails left after 4:30 p.m. will not be returned until the following business day.  For prescription refill requests, have your pharmacy contact our office.         Resources For Cancer Patients and their Caregivers ? American Cancer Society: Can assist with transportation, wigs, general needs, runs Look Good Feel Better.        707-827-7582 ? Cancer Care: Provides financial assistance, online support groups,  medication/co-pay assistance.  1-800-813-HOPE 830-552-3229) ? [place] Assists [place] cancer patients and their families through emotional , educational and financial support.  480-732-1575 ? [place] DSS Where to apply for food stamps, Medicaid and utility assistance. 505-542-1774 ? RCATS: Transportation to medical appointments. 862-509-7770 ? Social Security Administration: May apply for disability if have a Stage IV cancer. [phone] [phone] ? [employer], Disability and Transit Services: Assists with nutrition, care and transit needs. [place] Support Programs: @10RELATIVEDAYS @ > Cancer Support Group  2nd Tuesday of the month 1pm-2pm, Journey Room  > Creative Journey  3rd Tuesday of the month 1130am-1pm, Journey Room  > Look Good Feel Better  1st Wednesday of the month 10am-12 noon, Journey Room (Call [place] to register [phone])

## 2016-06-30 ENCOUNTER — Telehealth: Payer: Self-pay

## 2016-06-30 ENCOUNTER — Other Ambulatory Visit: Payer: Self-pay

## 2016-06-30 ENCOUNTER — Telehealth: Payer: Self-pay | Admitting: *Deleted

## 2016-06-30 DIAGNOSIS — R238 Other skin changes: Secondary | ICD-10-CM | POA: Diagnosis not present

## 2016-06-30 DIAGNOSIS — Z23 Encounter for immunization: Secondary | ICD-10-CM | POA: Diagnosis not present

## 2016-06-30 DIAGNOSIS — Z1389 Encounter for screening for other disorder: Secondary | ICD-10-CM | POA: Diagnosis not present

## 2016-06-30 DIAGNOSIS — D649 Anemia, unspecified: Secondary | ICD-10-CM

## 2016-06-30 MED ORDER — PEG 3350-KCL-NA BICARB-NACL 420 G PO SOLR: 4000.0000 mL | ORAL | 0 refills | Status: DC

## 2016-06-30 NOTE — Telephone Encounter (Signed)
Sent message to Edrick Oh RN and informed.

## 2016-06-30 NOTE — Telephone Encounter (Signed)
-----   Message from Hassan Rowan, LPN sent at 40/05/7352 10:38 AM EDT ----- Regarding: Hold Coumadin Pt is scheduled for Colonoscopy w/possible EGD on 07/24/16. Roseanne Kaufman NP had advised to hold Coumadin 4 days prior to procedure. When I informed pt's husband he asked that I let you know about holding Coumadin. He said she has an appt with you on 07/21/16.

## 2016-06-30 NOTE — Telephone Encounter (Signed)
Spoke with pt's husband.  Informed him pt's colonoscopy is scheduled for 10/26.  Pt will take last dose of coumadin 10/21.  She will restart coumadin night of procedure if OK with MD.  She will take 7.5mg  x 4 days then resume 7.5mg  daily except 5mg  on M,W,F.  Come for INR appt on 07/30/16@ 10:50am.

## 2016-06-30 NOTE — Telephone Encounter (Signed)
OK to proceed without Lovenox.

## 2016-06-30 NOTE — Telephone Encounter (Signed)
Called pt to set up TCS/ +/-EGD. Spoke with husband. Procedure scheduled for 07/24/16 at 9:00 am. Instructed to hold Coumadin 4 days prior to procedure (husband requested Edrick Oh RN be informed, has Coumadin appt 07/21/16). Take Levemir 6 units night before and none day of procedure. Instructions mailed.

## 2016-07-10 ENCOUNTER — Other Ambulatory Visit (HOSPITAL_COMMUNITY): Payer: Medicare Other

## 2016-07-10 ENCOUNTER — Other Ambulatory Visit: Payer: Self-pay | Admitting: "Endocrinology

## 2016-07-10 ENCOUNTER — Ambulatory Visit (HOSPITAL_COMMUNITY): Payer: Medicare Other

## 2016-07-10 DIAGNOSIS — M329 Systemic lupus erythematosus, unspecified: Secondary | ICD-10-CM | POA: Diagnosis not present

## 2016-07-10 DIAGNOSIS — N184 Chronic kidney disease, stage 4 (severe): Secondary | ICD-10-CM | POA: Diagnosis not present

## 2016-07-10 DIAGNOSIS — N189 Chronic kidney disease, unspecified: Secondary | ICD-10-CM | POA: Diagnosis not present

## 2016-07-10 DIAGNOSIS — M150 Primary generalized (osteo)arthritis: Secondary | ICD-10-CM | POA: Diagnosis not present

## 2016-07-10 DIAGNOSIS — Z794 Long term (current) use of insulin: Secondary | ICD-10-CM | POA: Diagnosis not present

## 2016-07-10 DIAGNOSIS — M7581 Other shoulder lesions, right shoulder: Secondary | ICD-10-CM | POA: Diagnosis not present

## 2016-07-10 DIAGNOSIS — M19072 Primary osteoarthritis, left ankle and foot: Secondary | ICD-10-CM | POA: Diagnosis not present

## 2016-07-10 DIAGNOSIS — M810 Age-related osteoporosis without current pathological fracture: Secondary | ICD-10-CM | POA: Diagnosis not present

## 2016-07-10 DIAGNOSIS — E1122 Type 2 diabetes mellitus with diabetic chronic kidney disease: Secondary | ICD-10-CM | POA: Diagnosis not present

## 2016-07-10 DIAGNOSIS — M19071 Primary osteoarthritis, right ankle and foot: Secondary | ICD-10-CM | POA: Diagnosis not present

## 2016-07-11 ENCOUNTER — Encounter (HOSPITAL_COMMUNITY): Payer: Medicare Other | Attending: Hematology & Oncology

## 2016-07-11 ENCOUNTER — Encounter (HOSPITAL_COMMUNITY): Payer: Self-pay

## 2016-07-11 ENCOUNTER — Encounter (HOSPITAL_COMMUNITY): Payer: Medicare Other

## 2016-07-11 VITALS — BP 161/44 | HR 63 | Temp 98.0°F | Resp 18

## 2016-07-11 DIAGNOSIS — R195 Other fecal abnormalities: Secondary | ICD-10-CM

## 2016-07-11 DIAGNOSIS — N058 Unspecified nephritic syndrome with other morphologic changes: Secondary | ICD-10-CM

## 2016-07-11 DIAGNOSIS — D631 Anemia in chronic kidney disease: Secondary | ICD-10-CM

## 2016-07-11 DIAGNOSIS — D509 Iron deficiency anemia, unspecified: Secondary | ICD-10-CM

## 2016-07-11 DIAGNOSIS — N184 Chronic kidney disease, stage 4 (severe): Principal | ICD-10-CM

## 2016-07-11 DIAGNOSIS — N189 Chronic kidney disease, unspecified: Secondary | ICD-10-CM

## 2016-07-11 LAB — COMPREHENSIVE METABOLIC PANEL
A/G RATIO: 1.2 (ref 1.2–2.2)
ALK PHOS: 56 IU/L (ref 39–117)
ALT: 13 IU/L (ref 0–32)
AST: 17 IU/L (ref 0–40)
Albumin: 3.9 g/dL (ref 3.5–4.8)
BILIRUBIN TOTAL: 0.3 mg/dL (ref 0.0–1.2)
BUN/Creatinine Ratio: 29 — ABNORMAL HIGH (ref 12–28)
BUN: 75 mg/dL — ABNORMAL HIGH (ref 8–27)
CALCIUM: 8.4 mg/dL — AB (ref 8.7–10.3)
CHLORIDE: 102 mmol/L (ref 96–106)
CO2: 21 mmol/L (ref 18–29)
Creatinine, Ser: 2.62 mg/dL — ABNORMAL HIGH (ref 0.57–1.00)
GFR calc Af Amer: 21 mL/min/{1.73_m2} — ABNORMAL LOW (ref >59)
GFR, EST NON AFRICAN AMERICAN: 18 mL/min/{1.73_m2} — AB (ref >59)
Globulin, Total: 3.3 g/dL (ref 1.5–4.5)
Glucose: 137 mg/dL — ABNORMAL HIGH (ref 65–99)
POTASSIUM: 4.5 mmol/L (ref 3.5–5.2)
SODIUM: 142 mmol/L (ref 134–144)
Total Protein: 7.2 g/dL (ref 6.0–8.5)

## 2016-07-11 LAB — CBC WITH DIFFERENTIAL/PLATELET
BASOS PCT: 0 %
Basophils Absolute: 0 10*3/uL (ref 0.0–0.1)
EOS ABS: 0 10*3/uL (ref 0.0–0.7)
Eosinophils Relative: 0 %
HCT: 30.4 % — ABNORMAL LOW (ref 36.0–46.0)
Hemoglobin: 9.4 g/dL — ABNORMAL LOW (ref 12.0–15.0)
Lymphocytes Relative: 8 %
Lymphs Abs: 0.6 10*3/uL — ABNORMAL LOW (ref 0.7–4.0)
MCH: 26.4 pg (ref 26.0–34.0)
MCHC: 30.9 g/dL (ref 30.0–36.0)
MCV: 85.4 fL (ref 78.0–100.0)
MONO ABS: 0.4 10*3/uL (ref 0.1–1.0)
MONOS PCT: 5 %
Neutro Abs: 6.8 10*3/uL (ref 1.7–7.7)
Neutrophils Relative %: 87 %
PLATELETS: 255 10*3/uL (ref 150–400)
RBC: 3.56 MIL/uL — ABNORMAL LOW (ref 3.87–5.11)
RDW: 16.2 % — AB (ref 11.5–15.5)
WBC: 7.8 10*3/uL (ref 4.0–10.5)

## 2016-07-11 LAB — FERRITIN: Ferritin: 108 ng/mL (ref 11–307)

## 2016-07-11 LAB — HGB A1C W/O EAG: HEMOGLOBIN A1C: 5.7 % — AB (ref 4.8–5.6)

## 2016-07-11 MED ORDER — DARBEPOETIN ALFA 150 MCG/0.3ML IJ SOSY
PREFILLED_SYRINGE | INTRAMUSCULAR | Status: AC
  Filled 2016-07-11: qty 0.3

## 2016-07-11 MED ORDER — DARBEPOETIN ALFA 150 MCG/0.3ML IJ SOSY
125.0000 ug | PREFILLED_SYRINGE | Freq: Once | INTRAMUSCULAR | Status: AC
  Administered 2016-07-11: 125 ug via SUBCUTANEOUS

## 2016-07-11 NOTE — Progress Notes (Signed)
Pt here today for Aranesp injection. PT's BP elevated, spoke with Kirby Crigler PA-C and he advised to proceed with injection. Pt given injection in right lower abdomen. Pt tolerated injection well. Pt stable and discharged home with her husband.

## 2016-07-11 NOTE — Patient Instructions (Signed)
[place] at [facility] Discharge Instructions  RECOMMENDATIONS MADE BY THE CONSULTANT AND ANY TEST RESULTS WILL BE SENT TO YOUR REFERRING PHYSICIAN.  You were given an Aranesp injection today.  Return as scheduled.  Thank you for choosing [place] at [facility] to provide your oncology and hematology care.  To afford each patient quality time with our provider, please arrive at least 15 minutes before your scheduled appointment time.   Beginning January 23rd 2017 lab work for the [facility] will be done in the  Main lab at [facility] on 1st floor. If you have a lab appointment with the [place] please come in thru the  Main Entrance and check in at the main information desk  You need to re-schedule your appointment should you arrive 10 or more minutes late.  We strive to give you quality time with our providers, and arriving late affects you and other patients whose appointments are after yours.  Also, if you no show three or more times for appointments you may be dismissed from the clinic at the providers discretion.     Again, thank you for choosing [facility].  Our hope is that these requests will decrease the amount of time that you wait before being seen by our physicians.       _____________________________________________________________  Should you have questions after your visit to [facility], please contact our office at [phone] between the hours of 8:30 a.m. and 4:30 p.m.  Voicemails left after 4:30 p.m. will not be returned until the following business day.  For prescription refill requests, have your pharmacy contact our office.         Resources For Cancer Patients and their Caregivers ? American Cancer Society: Can assist with transportation, wigs, general needs, runs Look Good Feel Better.        ([phone] ? Cancer Care: Provides financial assistance, online support  groups, medication/co-pay assistance.  1-800-813-HOPE (941)102-5061) ? [place] Assists [place] cancer patients and their families through emotional , educational and financial support.  ([phone] ? [place] DSS Where to apply for food stamps, Medicaid and utility assistance. 225-403-2775 ? RCATS: Transportation to medical appointments. 425-269-5658 ? Social Security Administration: May apply for disability if have a Stage IV cancer. [phone] 5094187430 ? [employer], Disability and Transit Services: Assists with nutrition, care and transit needs. [place] Support Programs: @10RELATIVEDAYS @ > Cancer Support Group  2nd Tuesday of the month 1pm-2pm, Journey Room  > Creative Journey  3rd Tuesday of the month 1130am-1pm, Journey Room  > Look Good Feel Better  1st Wednesday of the month 10am-12 noon, Journey Room (Call [place] to register [phone])

## 2016-07-14 DIAGNOSIS — M329 Systemic lupus erythematosus, unspecified: Secondary | ICD-10-CM | POA: Diagnosis not present

## 2016-07-14 DIAGNOSIS — D631 Anemia in chronic kidney disease: Secondary | ICD-10-CM | POA: Diagnosis not present

## 2016-07-14 DIAGNOSIS — N184 Chronic kidney disease, stage 4 (severe): Secondary | ICD-10-CM | POA: Diagnosis not present

## 2016-07-14 DIAGNOSIS — N179 Acute kidney failure, unspecified: Secondary | ICD-10-CM | POA: Diagnosis not present

## 2016-07-14 DIAGNOSIS — N2581 Secondary hyperparathyroidism of renal origin: Secondary | ICD-10-CM | POA: Diagnosis not present

## 2016-07-17 ENCOUNTER — Encounter: Payer: Self-pay | Admitting: "Endocrinology

## 2016-07-17 ENCOUNTER — Other Ambulatory Visit: Payer: Self-pay | Admitting: "Endocrinology

## 2016-07-17 ENCOUNTER — Ambulatory Visit (INDEPENDENT_AMBULATORY_CARE_PROVIDER_SITE_OTHER): Payer: Medicare Other | Admitting: "Endocrinology

## 2016-07-17 VITALS — BP 138/62 | HR 60 | Ht 65.0 in | Wt 214.0 lb

## 2016-07-17 DIAGNOSIS — N184 Chronic kidney disease, stage 4 (severe): Secondary | ICD-10-CM | POA: Diagnosis not present

## 2016-07-17 DIAGNOSIS — E782 Mixed hyperlipidemia: Secondary | ICD-10-CM | POA: Diagnosis not present

## 2016-07-17 DIAGNOSIS — E1122 Type 2 diabetes mellitus with diabetic chronic kidney disease: Secondary | ICD-10-CM | POA: Diagnosis not present

## 2016-07-17 DIAGNOSIS — Z794 Long term (current) use of insulin: Secondary | ICD-10-CM

## 2016-07-17 DIAGNOSIS — I10 Essential (primary) hypertension: Secondary | ICD-10-CM | POA: Diagnosis not present

## 2016-07-17 NOTE — Patient Instructions (Signed)

## 2016-07-17 NOTE — Progress Notes (Signed)
Subjective:    Patient ID: [name], female    DOB: 04-05-1946,    Past Medical History:  Diagnosis Date  . Anemia   . Asthma   . Atrial flutter [facility])    TEE/DCCV December 2013 - on Xarelto [facility])  . Bone spur of toe    Right 5th toe  . CKD (chronic Little disease) stage 3, GFR 30-59 ml/min   . DVT of upper extremity (deep vein thrombosis) ([place])    Right basilic vein, June 0350  . Essential hypertension, benign   . SLE (systemic lupus erythematosus) ([place])   . Type 2 diabetes mellitus ([place])   . UTI (lower urinary tract infection)    Past Surgical History:  Procedure Laterality Date  . ABDOMINAL HYSTERECTOMY  1983  . ANKLE SURGERY  1993  . AV FISTULA PLACEMENT Left 03/27/2014   Procedure: ARTERIOVENOUS (AV) FISTULA CREATION;  Surgeon: Rosetta Posner, MD;  Location: [facility];  Service: Vascular;  Laterality: Left;  . BACK SURGERY  1980  . CARDIOVASCULAR STRESS TEST  12/19/2009   no stress induced rhythm abnormalities, ekg negative for ischemia  . CARDIOVERSION  09/16/2012   Procedure: CARDIOVERSION;  Surgeon: Sanda Klein, MD;  Location: [facility] ENDOSCOPY;  Service: Cardiovascular;  Laterality: N/A;  . COLONOSCOPY  09/20/2012   Dr. [name]: multiple tubular adenomas   . COLONOSCOPY  2005   Dr. [name]. Polyps, path unknown   . ESOPHAGOGASTRODUODENOSCOPY  09/20/2012   Dr. [name]: duodenal erosion and possible resolving ulcer at the angularis   . TEE WITHOUT CARDIOVERSION  09/16/2012   Procedure: TRANSESOPHAGEAL ECHOCARDIOGRAM (TEE);  Surgeon: Sanda Klein, MD;  Location: [facility] ENDOSCOPY;  Service: Cardiovascular;  Laterality: N/A;  . TRANSESOPHAGEAL ECHOCARDIOGRAM WITH CARDIOVERSION  09/16/2012   EF 60-65%, moderate LVH, moderate regurg of the aortic valve, LA moderately dilated   Social History   Social History  . Marital status: Married    Spouse name: N/A  . Number of children: N/A  . Years of education: N/A   Social History Main Topics  . Smoking status: Former  Smoker    Packs/day: 1.00    Years: 30.00    Types: Cigarettes    Quit date: 08/29/2012  . Smokeless tobacco: Never Used     Comment: quit 08-2012  . Alcohol use No  . Drug use: No  . Sexual activity: Yes    Partners: Male   Other Topics Concern  . None   Social History Narrative  . None   Outpatient Encounter Prescriptions as of 07/17/2016  Medication Sig  . acetaminophen (TYLENOL) 500 MG tablet Take 1,000 mg by mouth every 6 (six) hours as needed. For pain  . albuterol (ACCUNEB) 0.63 MG/3ML nebulizer solution 1 ampule.  Marland Kitchen albuterol (PROVENTIL HFA;VENTOLIN HFA) 108 (90 BASE) MCG/ACT inhaler Inhale 2 puffs into the lungs every 6 (six) hours as needed. For shortness of breath  . amLODipine (NORVASC) 10 MG tablet Take 1 tablet (10 mg total) by mouth at bedtime.  Marland Kitchen atorvastatin (LIPITOR) 40 MG tablet Take 40 mg by mouth daily. 1/2 tablet 20 mg  . brimonidine-timolol (COMBIGAN) 0.2-0.5 % ophthalmic solution Apply 1 drop to eye daily.   . calcitRIOL (ROCALTROL) 0.25 MCG capsule Take 0.25 mcg by mouth as directed. Take on Mondays Wednesdays and Fridays  . diclofenac (VOLTAREN) 0.1 % ophthalmic solution Place 1 drop into the left eye daily. Uses at lunch time  . febuxostat (ULORIC) 40 MG tablet Take 40 mg by mouth daily.  Marland Kitchen  folic acid (FOLVITE) 1 MG tablet Take 1 mg by mouth daily.  . furosemide (LASIX) 80 MG tablet Take 80 mg by mouth 2 (two) times daily. Take 2 in the AM and 2 in the Evening  . HYDROcodone-acetaminophen (NORCO/VICODIN) 5-325 MG per tablet Take 1-2 tablets by mouth every 4 (four) hours as needed for moderate pain.  . hydroxychloroquine (PLAQUENIL) 200 MG tablet Take 200 mg by mouth 2 (two) times daily.  . insulin aspart (NOVOLOG) 100 UNIT/ML injection Inject 5-11 Units into the skin 3 (three) times daily with meals.  . Insulin Detemir (LEVEMIR) 100 UNIT/ML Pen Inject 12 Units into the skin at bedtime.  . metoprolol (LOPRESSOR) 50 MG tablet TAKE ONE TABLET BY MOUTH TWICE  DAILY.  . Multiple Vitamins-Minerals (CENTRUM SILVER PO) Take 1 tablet by mouth daily.  . ONE TOUCH ULTRA TEST test strip TEST 4 TIMES A DAY AS DIRECTED.  Marland Kitchen ONETOUCH DELICA LANCETS 81K MISC USE TO TEST 4 TIMES DAILY.  Marland Kitchen polyethylene glycol-electrolytes (TRILYTE) 420 g solution Take 4,000 mLs by mouth as directed.  . predniSONE (DELTASONE) 20 MG tablet Take 5 mg by mouth as directed. Pt takes 10 mg and 5mg  alternating days  . travoprost, benzalkonium, (TRAVATAN) 0.004 % ophthalmic solution Place 1 drop into the left eye at bedtime.  Marland Kitchen UNIFINE PENTIPS 31G X 6 MM MISC USE AS DIRECTED AT BEDTIME WITH LEVEMIR AND WITH SLIDING SCALE INSULIN.  Marland Kitchen Vitamin D, Ergocalciferol, (DRISDOL) 50000 units CAPS capsule TAKE 1 CAPSULE BY MOUTH ONCE WEEKLY.  . warfarin (COUMADIN) 5 MG tablet Take 1 1/2 tablets daily except 1 tablet on Thursdays (Patient taking differently: Take 5 mg by mouth as directed. Takes 1 1/2 Monday, Wednesday, Friday and 1 tablet all other days.)   No facility-administered encounter medications on file as of 07/17/2016.    ALLERGIES: Allergies  Allergen Reactions  . Ace Inhibitors Cough   VACCINATION STATUS: Immunization History  Administered Date(s) Administered  . Influenza,inj,Quad PF,36+ Mos 07/03/2014  . Influenza-Unspecified 07/11/2015    Diabetes  She presents for her follow-up diabetic visit. She has type 2 diabetes mellitus. Onset time: diagnosed at approx age of 6. Her disease course has been improving. Pertinent negatives for hypoglycemia include no confusion, headaches, pallor or seizures. Associated symptoms include fatigue. Pertinent negatives for diabetes include no chest pain, no polydipsia, no polyphagia and no polyuria. Symptoms are improving. Diabetic complications include nephropathy. Risk factors for coronary artery disease include dyslipidemia, diabetes mellitus and obesity. She is compliant with treatment most of the time. Her weight is stable. She is following a  generally unhealthy diet. She has had a previous visit with a dietitian. She never participates in exercise. Her home blood glucose trend is decreasing steadily (Her A1c has improved to 6%, no hypoglycemia. Her EAG is between 100- 150.). Her breakfast blood glucose range is generally 140-180 mg/dl. Her lunch blood glucose range is generally 140-180 mg/dl. Her dinner blood glucose range is generally 140-180 mg/dl. Her overall blood glucose range is 140-180 mg/dl. An ACE inhibitor/angiotensin II receptor blocker is being taken.  Hypertension  Pertinent negatives include no chest pain, headaches, palpitations or shortness of breath.  Hyperlipidemia  Pertinent negatives include no chest pain, myalgias or shortness of breath. Current antihyperlipidemic treatment includes statins.     Review of Systems  Constitutional: Positive for fatigue. Negative for unexpected weight change.  HENT: Negative for trouble swallowing and voice change.   Eyes: Negative for visual disturbance.  Respiratory: Negative for cough, shortness of  breath and wheezing.   Cardiovascular: Negative for chest pain, palpitations and leg swelling.  Gastrointestinal: Negative for diarrhea, nausea and vomiting.  Endocrine: Negative for cold intolerance, heat intolerance, polydipsia, polyphagia and polyuria.  Musculoskeletal: Negative for arthralgias and myalgias.  Skin: Negative for color change, pallor, rash and wound.  Neurological: Negative for seizures and headaches.  Psychiatric/Behavioral: Negative for confusion and suicidal ideas.    Objective:    BP 138/62   Pulse 60   Ht 5\' 5"  (1.651 m)   Wt 214 lb (97.1 kg)   BMI 35.61 kg/m   Wt Readings from Last 3 Encounters:  07/17/16 214 lb (97.1 kg)  06/25/16 210 lb 9.6 oz (95.5 kg)  05/28/16 214 lb (97.1 kg)    Physical Exam  Constitutional: She is oriented to person, place, and time. She appears well-developed.  HENT:  Head: Normocephalic and atraumatic.  Eyes: EOM are  normal.  Neck: Normal range of motion. Neck supple. No tracheal deviation present. No thyromegaly present.  Cardiovascular: Normal rate and regular rhythm.   Pulmonary/Chest: Effort normal and breath sounds normal.  Abdominal: Soft. Bowel sounds are normal. There is no tenderness. There is no guarding.  Musculoskeletal: Normal range of motion. She exhibits no edema.  Neurological: She is alert and oriented to person, place, and time. She has normal reflexes. No cranial nerve deficit. Coordination normal.  Skin: Skin is warm and dry. No rash noted. No erythema. No pallor.  Psychiatric: She has a normal mood and affect. Judgment normal.    Chemistry (most recent): Lab Results  Component Value Date   NA 142 07/10/2016   K 4.5 07/10/2016   CL 102 07/10/2016   CO2 21 07/10/2016   BUN 75 (H) 07/10/2016   CREATININE 2.62 (H) 07/10/2016   Diabetic Labs (most recent): Lab Results  Component Value Date   HGBA1C 5.7 (H) 07/10/2016   HGBA1C 5.9 (H) 04/02/2016   HGBA1C 6.0 (H) 12/27/2015     Assessment & Plan:   1. Type 2 diabetes mellitus with stage 4 chronic Little disease Patient is advised to stick to a routine mealtimes to eat 3 meals  a day and avoid unnecessary snacks ( to snack only to correct hypoglycemia). -Her A1c is 5.7% in disagreement with her estimated average blood glucose of 100-150 likely still  due to mild anemia with hemoglobin of 9.4. Patient is advised to eliminate simple carbs  from their diet including cakes desserts ice cream soda (  diet and regular) , sweet tea , Candies,  chips and cookies, artificial sweeteners,   and "sugar-free" products .  This will help patient to have stable blood glucose profile and potentially lose weight. Patient is given detailed personalized glucose monitoring and insulin dosing instructions. Patient is instructed to call back with extremes of blood glucose less than 70 or greater than 300. Patient to bring meter and  blood glucose logs  during their next visit.  I will continue Levemir   12 units qhs, continue novolog 5 units TIDAC plus correction. -She has a chance to come off of NovoLog only if she is willing to avoid concentrated carbohydrates such as soda. - She is advised to hold off insulin treatment during her preparation for colonoscopy with adequate diet. She will call clinic if she registers greater than 300 mg/dL. 2. Essential hypertension, benign Controlled. Continue ARB.  3. Hyperlipemia Continue atorvastatatin.   4. Morbid obesity Carbs advise given. See above #1. 5. vitamin D deficiency.  -Continue current Vitamin  D supplement. Will obtain : - Vit D  25 hydroxy (rtn osteoporosis monitoring)   Follow up plan: Return in about 3 months (around 10/17/2016) for follow up with pre-visit labs, meter, and logs.  Glade Lloyd, MD Phone: [phone]  Fax: [phone]   07/17/2016, 1:47 PM

## 2016-07-22 DIAGNOSIS — H401123 Primary open-angle glaucoma, left eye, severe stage: Secondary | ICD-10-CM | POA: Diagnosis not present

## 2016-07-22 DIAGNOSIS — M329 Systemic lupus erythematosus, unspecified: Secondary | ICD-10-CM | POA: Diagnosis not present

## 2016-07-22 DIAGNOSIS — H401111 Primary open-angle glaucoma, right eye, mild stage: Secondary | ICD-10-CM | POA: Diagnosis not present

## 2016-07-22 DIAGNOSIS — Z79899 Other long term (current) drug therapy: Secondary | ICD-10-CM | POA: Diagnosis not present

## 2016-07-24 ENCOUNTER — Ambulatory Visit (HOSPITAL_COMMUNITY): Payer: Medicare Other

## 2016-07-24 ENCOUNTER — Encounter (HOSPITAL_COMMUNITY): Payer: Self-pay | Admitting: *Deleted

## 2016-07-24 ENCOUNTER — Ambulatory Visit (HOSPITAL_COMMUNITY)
Admission: RE | Admit: 2016-07-24 | Discharge: 2016-07-24 | Disposition: A | Discharge status: Home / Self Care | Payer: Medicare Other | Source: Ambulatory Visit | Attending: Internal Medicine | Admitting: Internal Medicine

## 2016-07-24 ENCOUNTER — Other Ambulatory Visit (HOSPITAL_COMMUNITY): Payer: Medicare Other

## 2016-07-24 ENCOUNTER — Ambulatory Visit (HOSPITAL_COMMUNITY): Payer: Medicare Other | Admitting: Hematology & Oncology

## 2016-07-24 ENCOUNTER — Encounter (HOSPITAL_COMMUNITY)
Admission: RE | Disposition: A | Discharge status: Home / Self Care | Payer: Self-pay | Source: Ambulatory Visit | Attending: Internal Medicine

## 2016-07-24 DIAGNOSIS — Z86010 Personal history of colonic polyps: Secondary | ICD-10-CM | POA: Diagnosis not present

## 2016-07-24 DIAGNOSIS — R195 Other fecal abnormalities: Secondary | ICD-10-CM | POA: Diagnosis not present

## 2016-07-24 DIAGNOSIS — E1122 Type 2 diabetes mellitus with diabetic chronic kidney disease: Secondary | ICD-10-CM | POA: Diagnosis not present

## 2016-07-24 DIAGNOSIS — D649 Anemia, unspecified: Secondary | ICD-10-CM

## 2016-07-24 DIAGNOSIS — Z7952 Long term (current) use of systemic steroids: Secondary | ICD-10-CM | POA: Insufficient documentation

## 2016-07-24 DIAGNOSIS — N183 Chronic kidney disease, stage 3 (moderate): Secondary | ICD-10-CM | POA: Diagnosis not present

## 2016-07-24 DIAGNOSIS — J45909 Unspecified asthma, uncomplicated: Secondary | ICD-10-CM | POA: Insufficient documentation

## 2016-07-24 DIAGNOSIS — Z79899 Other long term (current) drug therapy: Secondary | ICD-10-CM | POA: Diagnosis not present

## 2016-07-24 DIAGNOSIS — M329 Systemic lupus erythematosus, unspecified: Secondary | ICD-10-CM | POA: Insufficient documentation

## 2016-07-24 DIAGNOSIS — Z86718 Personal history of other venous thrombosis and embolism: Secondary | ICD-10-CM | POA: Insufficient documentation

## 2016-07-24 DIAGNOSIS — D631 Anemia in chronic kidney disease: Secondary | ICD-10-CM | POA: Insufficient documentation

## 2016-07-24 DIAGNOSIS — D126 Benign neoplasm of colon, unspecified: Secondary | ICD-10-CM | POA: Diagnosis not present

## 2016-07-24 DIAGNOSIS — Z7901 Long term (current) use of anticoagulants: Secondary | ICD-10-CM | POA: Diagnosis not present

## 2016-07-24 DIAGNOSIS — Z794 Long term (current) use of insulin: Secondary | ICD-10-CM | POA: Insufficient documentation

## 2016-07-24 DIAGNOSIS — D123 Benign neoplasm of transverse colon: Secondary | ICD-10-CM | POA: Insufficient documentation

## 2016-07-24 DIAGNOSIS — I4892 Unspecified atrial flutter: Secondary | ICD-10-CM | POA: Insufficient documentation

## 2016-07-24 DIAGNOSIS — I129 Hypertensive chronic kidney disease with stage 1 through stage 4 chronic kidney disease, or unspecified chronic kidney disease: Secondary | ICD-10-CM | POA: Insufficient documentation

## 2016-07-24 DIAGNOSIS — Z87891 Personal history of nicotine dependence: Secondary | ICD-10-CM | POA: Insufficient documentation

## 2016-07-24 HISTORY — PX: COLONOSCOPY: SHX5424

## 2016-07-24 LAB — GLUCOSE, CAPILLARY: GLUCOSE-CAPILLARY: 109 mg/dL — AB (ref 65–99)

## 2016-07-24 SURGERY — COLONOSCOPY
Anesthesia: Moderate Sedation

## 2016-07-24 MED ORDER — ONDANSETRON HCL 4 MG/2ML IJ SOLN
INTRAMUSCULAR | Status: DC | PRN
  Administered 2016-07-24: 4 mg via INTRAVENOUS

## 2016-07-24 MED ORDER — LIDOCAINE VISCOUS 2 % MT SOLN
OROMUCOSAL | Status: AC
  Filled 2016-07-24: qty 15

## 2016-07-24 MED ORDER — MEPERIDINE HCL 100 MG/ML IJ SOLN
INTRAMUSCULAR | Status: AC
  Filled 2016-07-24: qty 2

## 2016-07-24 MED ORDER — MIDAZOLAM HCL 5 MG/5ML IJ SOLN
INTRAMUSCULAR | Status: DC | PRN
  Administered 2016-07-24: 2 mg via INTRAVENOUS
  Administered 2016-07-24: 1 mg via INTRAVENOUS
  Administered 2016-07-24: 2 mg via INTRAVENOUS
  Administered 2016-07-24 (×5): 1 mg via INTRAVENOUS

## 2016-07-24 MED ORDER — LIDOCAINE VISCOUS 2 % MT SOLN
OROMUCOSAL | Status: DC | PRN
  Administered 2016-07-24: 3 mL via OROMUCOSAL

## 2016-07-24 MED ORDER — MEPERIDINE HCL 100 MG/ML IJ SOLN
INTRAMUSCULAR | Status: DC | PRN
  Administered 2016-07-24 (×2): 25 mg via INTRAVENOUS

## 2016-07-24 MED ORDER — ONDANSETRON HCL 4 MG/2ML IJ SOLN
INTRAMUSCULAR | Status: AC
  Filled 2016-07-24: qty 2

## 2016-07-24 MED ORDER — MIDAZOLAM HCL 5 MG/5ML IJ SOLN
INTRAMUSCULAR | Status: AC
  Filled 2016-07-24: qty 10

## 2016-07-24 MED ORDER — STERILE WATER FOR IRRIGATION IR SOLN
Status: DC | PRN
  Administered 2016-07-24: 2.5 mL

## 2016-07-24 MED ORDER — SODIUM CHLORIDE 0.9 % IV SOLN
INTRAVENOUS | Status: DC
  Administered 2016-07-24: 1000 mL via INTRAVENOUS

## 2016-07-24 NOTE — Discharge Instructions (Signed)
Colonoscopy Discharge Instructions  Read the instructions outlined below and refer to this sheet in the next few weeks. These discharge instructions provide you with general information on caring for yourself after you leave the hospital. Your doctor may also give you specific instructions. While your treatment has been planned according to the most current medical practices available, unavoidable complications occasionally occur. If you have any problems or questions after discharge, call Dr. Gala Little at [phone]. ACTIVITY  You may resume your regular activity, but move at a slower pace for the next 24 hours.   Take frequent rest periods for the next 24 hours.   Walking will help get rid of the air and reduce the bloated feeling in your belly (abdomen).   No driving for 24 hours (because of the medicine (anesthesia) used during the test).    Do not sign any important legal documents or operate any machinery for 24 hours (because of the anesthesia used during the test).  NUTRITION  Drink plenty of fluids.   You may resume your normal diet as instructed by your doctor.   Begin with a light meal and progress to your normal diet. Heavy or fried foods are harder to digest and may make you feel sick to your stomach (nauseated).   Avoid alcoholic beverages for 24 hours or as instructed.  MEDICATIONS  You may resume your normal medications unless your doctor tells you otherwise.  WHAT YOU CAN EXPECT TODAY  Some feelings of bloating in the abdomen.   Passage of more gas than usual.   Spotting of blood in your stool or on the toilet paper.  IF YOU HAD POLYPS REMOVED DURING THE COLONOSCOPY:  No aspirin products for 7 days or as instructed.   No alcohol for 7 days or as instructed.   Eat a soft diet for the next 24 hours.  FINDING OUT THE RESULTS OF YOUR TEST Not all test results are available during your visit. If your test results are not back during the visit, make an appointment  with your caregiver to find out the results. Do not assume everything is normal if you have not heard from your caregiver or the medical facility. It is important for you to follow up on all of your test results.  SEEK IMMEDIATE MEDICAL ATTENTION IF:  You have more than a spotting of blood in your stool.   Your belly is swollen (abdominal distention).   You are nauseated or vomiting.   You have a temperature over 101.   You have abdominal pain or discomfort that is severe or gets worse throughout the day.    Colon polyp information provided  Further recommendations to follow pending review of pathology report  Resume Coumadin tomorrow  Colon Polyps Polyps are lumps of extra tissue growing inside the body. Polyps can grow in the large intestine (colon). Most colon polyps are noncancerous (benign). However, some colon polyps can become cancerous over time. Polyps that are larger than a pea may be harmful. To be safe, caregivers remove and test all polyps. CAUSES  Polyps form when mutations in the genes cause your cells to grow and divide even though no more tissue is needed. RISK FACTORS There are a number of risk factors that can increase your chances of getting colon polyps. They include:  Being older than 50 years.  Family history of colon polyps or colon cancer.  Long-term colon diseases, such as colitis or Crohn disease.  Being overweight.  Smoking.  Being inactive.  Drinking too much alcohol. SYMPTOMS  Most small polyps do not cause symptoms. If symptoms are present, they may include:  Blood in the stool. The stool may look dark red or black.  Constipation or diarrhea that lasts longer than 1 week. DIAGNOSIS People often do not know they have polyps until their caregiver finds them during a regular checkup. Your caregiver can use 4 tests to check for polyps:  Digital rectal exam. The caregiver wears gloves and feels inside the rectum. This test would find polyps  only in the rectum.  Barium enema. The caregiver puts a liquid called barium into your rectum before taking X-rays of your colon. Barium makes your colon look white. Polyps are dark, so they are easy to see in the X-ray pictures.  Sigmoidoscopy. A thin, flexible tube (sigmoidoscope) is placed into your rectum. The sigmoidoscope has a light and tiny camera in it. The caregiver uses the sigmoidoscope to look at the last third of your colon.  Colonoscopy. This test is like sigmoidoscopy, but the caregiver looks at the entire colon. This is the most common method for finding and removing polyps. TREATMENT  Any polyps will be removed during a sigmoidoscopy or colonoscopy. The polyps are then tested for cancer. PREVENTION  To help lower your risk of getting more colon polyps:  Eat plenty of fruits and vegetables. Avoid eating fatty foods.  Do not smoke.  Avoid drinking alcohol.  Exercise every day.  Lose weight if recommended by your caregiver.  Eat plenty of calcium and folate. Foods that are rich in calcium include milk, cheese, and broccoli. Foods that are rich in folate include chickpeas, kidney beans, and spinach. HOME CARE INSTRUCTIONS Keep all follow-up appointments as directed by your caregiver. You may need periodic exams to check for polyps. SEEK MEDICAL CARE IF: You notice bleeding during a bowel movement.   This information is not intended to replace advice given to you by your health care provider. Make sure you discuss any questions you have with your health care provider.   Document Released: 06/11/2004 Document Revised: 10/06/2014 Document Reviewed: 11/25/2011 Elsevier Interactive Patient Education [employer].

## 2016-07-24 NOTE — Interval H&P Note (Signed)
History and Physical Interval Note:  07/24/2016 9:22 AM  [name]  has presented today for surgery, with the diagnosis of anemia  The various methods of treatment have been discussed with the patient and family. After consideration of risks, benefits and other options for treatment, the patient has consented to  Procedure(s) with comments: COLONOSCOPY (N/A) - 9:00 am ESOPHAGOGASTRODUODENOSCOPY (EGD) (N/A) as a surgical intervention .  The patient's history has been reviewed, patient examined, no change in status, stable for surgery.  I have reviewed the patient's chart and labs.  Questions were answered to the patient's satisfaction.     No change. Colonoscopy with possible EGD to follow. The risks, benefits, limitations, imponderables and alternatives regarding both EGD and colonoscopy have been reviewed with the patient. Questions have been answered. All parties agreeable.   Manus Rudd

## 2016-07-24 NOTE — H&P (View-Only) (Signed)
Referring Provider: Sharilyn Sites, MD Primary Care Physician:  [name], MD  Chief Complaint  Patient presents with  . Blood In Stools    + stool card, no other problems    HPI:   [name] is a 70 y.o. female presenting today with a history of heme positive stool. She has been followed for anemia in chronic renal disease. Hgb has been slowly drifting. She is on chronic Coumadin, with history of an upper extremity DVT in June 2015, history of aflutter. On Prednisone chronically. Receives Aranesp injections. Last colonoscopy/EGD in 2013 by Dr. [name], performed due to anemia while hospitalized, noting multiple tubular adenomas and EGD with duodenal erosion and possible resolving ulcer at the angularis. She was last seen in Jan 2017, whereby a colonoscopy +/- EGD was arranged due to heme positive stool. They cancelled this due to concern for not eating prior to the colonoscopy.   Muscles/bones hurt all the time. No GI problems. No N/V, abdominal pain, rectal bleeding, constipation, diarrhea. Multiple reasons for not wanting to do the colonoscopy: bathroom is upstairs. Doesn't want to take the prep. Adjusting the medications. Received iron infusion in May. After further discussion with husband, patient understands importance of proceeding with colonoscopy. She is agreeable to proceeding.   Past Medical History:  Diagnosis Date  . Anemia   . Asthma   . Atrial flutter [facility])    TEE/DCCV December 2013 - on Xarelto [facility])  . Bone spur of toe    Right 5th toe  . CKD (chronic kidney disease) stage 3, GFR 30-59 ml/min   . DVT of upper extremity (deep vein thrombosis) ([place])    Right basilic vein, June 0630  . Essential hypertension, benign   . SLE (systemic lupus erythematosus) ([place])   . Type 2 diabetes mellitus ([place])   . UTI (lower urinary tract infection)     Past Surgical History:  Procedure Laterality Date  . ABDOMINAL HYSTERECTOMY  1983  . ANKLE SURGERY  1993    . AV FISTULA PLACEMENT Left 03/27/2014   Procedure: ARTERIOVENOUS (AV) FISTULA CREATION;  Surgeon: Rosetta Posner, MD;  Location: [place];  Service: Vascular;  Laterality: Left;  . BACK SURGERY  1980  . CARDIOVASCULAR STRESS TEST  12/19/2009   no stress induced rhythm abnormalities, ekg negative for ischemia  . CARDIOVERSION  09/16/2012   Procedure: CARDIOVERSION;  Surgeon: Sanda Klein, MD;  Location: [facility] ENDOSCOPY;  Service: Cardiovascular;  Laterality: N/A;  . COLONOSCOPY  09/20/2012   Dr. [name]: multiple tubular adenomas   . COLONOSCOPY  2005   Dr. [name]. Polyps, path unknown   . ESOPHAGOGASTRODUODENOSCOPY  09/20/2012   Dr. [name]: duodenal erosion and possible resolving ulcer at the angularis   . TEE WITHOUT CARDIOVERSION  09/16/2012   Procedure: TRANSESOPHAGEAL ECHOCARDIOGRAM (TEE);  Surgeon: Sanda Klein, MD;  Location: [facility] ENDOSCOPY;  Service: Cardiovascular;  Laterality: N/A;  . TRANSESOPHAGEAL ECHOCARDIOGRAM WITH CARDIOVERSION  09/16/2012   EF 60-65%, moderate LVH, moderate regurg of the aortic valve, LA moderately dilated    Current Outpatient Prescriptions  Medication Sig Dispense Refill  . acetaminophen (TYLENOL) 500 MG tablet Take 1,000 mg by mouth every 6 (six) hours as needed. For pain    . albuterol (ACCUNEB) 0.63 MG/3ML nebulizer solution 1 ampule.    Marland Kitchen albuterol (PROVENTIL HFA;VENTOLIN HFA) 108 (90 BASE) MCG/ACT inhaler Inhale 2 puffs into the lungs every 6 (six) hours as needed. For shortness of breath    . amLODipine (NORVASC) 10  MG tablet Take 1 tablet (10 mg total) by mouth at bedtime. 30 tablet 0  . atorvastatin (LIPITOR) 40 MG tablet Take 40 mg by mouth daily. 1/2 tablet 20 mg    . brimonidine-timolol (COMBIGAN) 0.2-0.5 % ophthalmic solution Apply 1 drop to eye daily.     . calcitRIOL (ROCALTROL) 0.25 MCG capsule Take 0.25 mcg by mouth as directed. Take on Mondays Wednesdays and Fridays    . diclofenac (VOLTAREN) 0.1 % ophthalmic solution Place 1 drop into the  left eye daily. Uses at lunch time    . febuxostat (ULORIC) 40 MG tablet Take 40 mg by mouth daily.    . folic acid (FOLVITE) 1 MG tablet Take 1 mg by mouth daily.    . furosemide (LASIX) 80 MG tablet Take 80 mg by mouth 2 (two) times daily. Take 2 in the AM and 2 in the Evening    . HYDROcodone-acetaminophen (NORCO/VICODIN) 5-325 MG per tablet Take 1-2 tablets by mouth every 4 (four) hours as needed for moderate pain. 10 tablet 0  . hydroxychloroquine (PLAQUENIL) 200 MG tablet Take 200 mg by mouth 2 (two) times daily.    . insulin aspart (NOVOLOG) 100 UNIT/ML injection Inject 5-11 Units into the skin 3 (three) times daily with meals.    . Insulin Detemir (LEVEMIR) 100 UNIT/ML Pen Inject 12 Units into the skin at bedtime. 15 mL 2  . metoprolol (LOPRESSOR) 50 MG tablet TAKE ONE TABLET BY MOUTH TWICE DAILY. 60 tablet 6  . Multiple Vitamins-Minerals (CENTRUM SILVER PO) Take 1 tablet by mouth daily.    . ONE TOUCH ULTRA TEST test strip TEST 4 TIMES A DAY AS DIRECTED. 150 each 5  . ONETOUCH DELICA LANCETS 37T MISC USE TO TEST 4 TIMES DAILY. 100 each 5  . predniSONE (DELTASONE) 20 MG tablet Take 5 mg by mouth as directed. Pt takes 10 mg and 5mg  alternating days    . travoprost, benzalkonium, (TRAVATAN) 0.004 % ophthalmic solution Place 1 drop into the left eye at bedtime.    Marland Kitchen UNIFINE PENTIPS 31G X 6 MM MISC USE AS DIRECTED AT BEDTIME WITH LEVEMIR AND WITH SLIDING SCALE INSULIN. 100 each 5  . Vitamin D, Ergocalciferol, (DRISDOL) 50000 units CAPS capsule TAKE 1 CAPSULE BY MOUTH ONCE WEEKLY. 12 capsule 0  . warfarin (COUMADIN) 5 MG tablet Take 1 1/2 tablets daily except 1 tablet on Thursdays (Patient taking differently: Take 5 mg by mouth as directed. Takes 1 1/2 Monday, Wednesday, Friday and 1 tablet all other days.) 180 tablet 3  . insulin lispro (HUMALOG KWIKPEN) 100 UNIT/ML KiwkPen Up to 11 units [facility] (Patient not taking: Reported on 06/25/2016) 15 mL 2  . polyethylene glycol-electrolytes  (NULYTELY/GOLYTELY) 420 g solution Take 4,000 mLs by mouth once. (Patient not taking: Reported on 06/25/2016) 4000 mL 0   No current facility-administered medications for this visit.     Allergies as of 06/25/2016 - Review Complete 06/25/2016  Allergen Reaction Noted  . Ace inhibitors Cough 06/15/2015    Family History  Problem Relation Age of Onset  . Diabetes Brother   . Diabetes Father   . Hypertension Father   . Hypertension Sister   . Stroke Mother   . Diabetes Sister   . Hypertension Brother   . Colon cancer Neg Hx     Social History   Social History  . Marital status: Married    Spouse name: N/A  . Number of children: N/A  . Years of education: N/A  Social History Main Topics  . Smoking status: Former Smoker    Packs/day: 1.00    Years: 30.00    Types: Cigarettes    Quit date: 08/29/2012  . Smokeless tobacco: Never Used     Comment: quit 08-2012  . Alcohol use No  . Drug use: No  . Sexual activity: Yes    Partners: Male   Other Topics Concern  . None   Social History Narrative  . None    Review of Systems: As mentioned in HPI   Physical Exam: BP (!) 146/68   Pulse 65   Temp 97.7 F (36.5 C) (Oral)   Ht 5\' 5"  (1.651 m)   Wt 210 lb 9.6 oz (95.5 kg)   BMI 35.05 kg/m  General:   Alert and oriented. No distress noted. Pleasant and cooperative.  Head:  Normocephalic and atraumatic. Eyes:  Conjuctiva clear without scleral icterus. Mouth:  Oral mucosa pink and moist.  Heart:  S1, S2 present with soft murmur  Abdomen:  +BS, soft, non-tender and non-distended. No rebound or guarding. No HSM or masses noted. Msk:  Symmetrical without gross deformities. Normal posture. Extremities:  Without edema. Neurologic:  Alert and  oriented x4;  grossly normal neurologically. Psych:  Alert and cooperative. Normal mood and affect.  Lab Results  Component Value Date   WBC 5.6 06/12/2016   HGB 8.8 (L) 06/12/2016   HCT 28.6 (L) 06/12/2016   MCV 85.1 06/12/2016    PLT 260 06/12/2016   Lab Results  Component Value Date   IRON 44 02/12/2015   TIBC 272 02/12/2015   FERRITIN 125 06/12/2016

## 2016-07-24 NOTE — Op Note (Signed)
[facility] Patient Name: Dorita Rowlands Procedure Date: 07/24/2016 9:20 AM MRN: [id number] Date of Birth: 06-24-1946 Attending MD: Norvel Richards , MD CSN: [id number] Age: 70 Admit Type: Outpatient Procedure:                Colonoscopy with multiple snare polypectomies Indications:              Heme positive stool Providers:                Norvel Richards, MD, Otis Peak B. Gwenlyn Perking RN, RN,                            Isabella Stalling, Technician Referring MD:              Medicines:                Midazolam 10 mg IV, Meperidine 50 mg IV,                            Ondansetron 4 mg IV Complications:            No immediate complications. Estimated Blood Loss:     Estimated blood loss was minimal. Procedure:                Pre-Anesthesia Assessment:                           - Prior to the procedure, a History and Physical                            was performed, and patient medications and                            allergies were reviewed. The patient's tolerance of                            previous anesthesia was also reviewed. The risks                            and benefits of the procedure and the sedation                            options and risks were discussed with the patient.                            All questions were answered, and informed consent                            was obtained. Prior Anticoagulants: The patient                            last took Coumadin (warfarin) 7 days prior to the                            procedure. ASA Grade Assessment: III - A patient  with severe systemic disease. After reviewing the                            risks and benefits, the patient was deemed in                            satisfactory condition to undergo the procedure.                           After obtaining informed consent, the colonoscope                            was passed under direct vision. Throughout the                             procedure, the patient's blood pressure, pulse, and                            oxygen saturations were monitored continuously. The                            EC-[id number] ([id number]) scope was introduced through                            the anus and advanced to the the cecum, identified                            by appendiceal orifice and ileocecal valve. The                            colonoscopy was somewhat difficult due to                            significant looping. The patient tolerated the                            procedure well. The quality of the bowel                            preparation was adequate. The ileocecal valve,                            appendiceal orifice, and rectum were photographed.                            The entire colon was well visualized. Scope In: 9:42:13 AM Scope Out: 10:22:13 AM Scope Withdrawal Time: 0 hours 7 minutes 40 seconds  Total Procedure Duration: 0 hours 40 minutes 0 seconds  Findings:      The perianal and digital rectal examinations were normal.      Three pedunculated polyps were found in the colon. (2)The polyps were 10       mm in size; (1)4 mm in size.. These polyps were removed with hot snare       in cold snare technique,  respectively. Resection and retrieval were       complete. Impression:               - multiple colonic polyps?"removed as described                            above. The larger polyps could have easily produced                            Hemoccult positive stool. No EGD today. Moderate Sedation:      Moderate (conscious) sedation was administered by the endoscopy nurse       and supervised by the endoscopist. The following parameters were       monitored: oxygen saturation, heart rate, blood pressure, respiratory       rate, EKG, adequacy of pulmonary ventilation, and response to care.       Total physician intraservice time was 52 minutes. Recommendation:           - Patient has a contact  number available for                            emergencies. The signs and symptoms of potential                            delayed complications were discussed with the                            patient. Return to normal activities tomorrow.                            Written discharge instructions were provided to the                            patient.                           - Advance diet as tolerated.                           - Repeat colonoscopy date to be determined after                            pending pathology results are reviewed for                            surveillance based on pathology results.                           - Return to GI office in 3 months.                           - Resume Coumadin (warfarin) at prior dose tomorrow. Procedure Code(s):        --- Professional ---                           [phone], Colonoscopy, flexible; with removal of  tumor(s), polyp(s), or other lesion(s) by snare                            technique                           99152, Moderate sedation services provided by the                            same physician or other qualified health care                            professional performing the diagnostic or                            therapeutic service that the sedation supports,                            requiring the presence of an independent trained                            observer to assist in the monitoring of the                            patient's level of consciousness and physiological                            status; initial 15 minutes of intraservice time,                            patient age 53 years or older                           [phone], Moderate sedation services; each additional                            15 minutes intraservice time                           99153, Moderate sedation services; each additional                            15 minutes intraservice  time Diagnosis Code(s):        --- Professional ---                           D12.6, Benign neoplasm of colon, unspecified                           R19.5, Other fecal abnormalities CPT copyright 2016 American Medical Association. All rights reserved. The codes documented in this report are preliminary and upon coder review may  be revised to meet current compliance requirements. [name]. Rourk, MD Norvel Richards, MD 07/24/2016 10:36:48 AM This report has been signed electronically. Number of Addenda: 0

## 2016-07-25 ENCOUNTER — Ambulatory Visit (HOSPITAL_COMMUNITY): Payer: Medicare Other | Attending: Hematology & Oncology | Admitting: Hematology & Oncology

## 2016-07-25 ENCOUNTER — Ambulatory Visit (HOSPITAL_COMMUNITY): Payer: Medicare Other

## 2016-07-25 ENCOUNTER — Encounter (HOSPITAL_COMMUNITY): Payer: Self-pay | Admitting: Hematology & Oncology

## 2016-07-25 ENCOUNTER — Encounter: Payer: Self-pay | Admitting: Internal Medicine

## 2016-07-25 ENCOUNTER — Ambulatory Visit (HOSPITAL_COMMUNITY): Payer: Medicare Other | Attending: Hematology & Oncology

## 2016-07-25 VITALS — BP 142/44 | HR 63 | Temp 97.3°F | Resp 20 | Wt 216.2 lb

## 2016-07-25 DIAGNOSIS — D631 Anemia in chronic kidney disease: Secondary | ICD-10-CM

## 2016-07-25 DIAGNOSIS — N189 Chronic kidney disease, unspecified: Secondary | ICD-10-CM

## 2016-07-25 DIAGNOSIS — Z86718 Personal history of other venous thrombosis and embolism: Secondary | ICD-10-CM | POA: Diagnosis not present

## 2016-07-25 DIAGNOSIS — Z86010 Personal history of colonic polyps: Secondary | ICD-10-CM | POA: Diagnosis not present

## 2016-07-25 DIAGNOSIS — N184 Chronic kidney disease, stage 4 (severe): Secondary | ICD-10-CM

## 2016-07-25 DIAGNOSIS — E1122 Type 2 diabetes mellitus with diabetic chronic kidney disease: Secondary | ICD-10-CM | POA: Diagnosis not present

## 2016-07-25 DIAGNOSIS — I4892 Unspecified atrial flutter: Secondary | ICD-10-CM | POA: Diagnosis not present

## 2016-07-25 DIAGNOSIS — Z87891 Personal history of nicotine dependence: Secondary | ICD-10-CM | POA: Diagnosis not present

## 2016-07-25 DIAGNOSIS — D123 Benign neoplasm of transverse colon: Secondary | ICD-10-CM | POA: Diagnosis not present

## 2016-07-25 DIAGNOSIS — E611 Iron deficiency: Secondary | ICD-10-CM | POA: Diagnosis not present

## 2016-07-25 DIAGNOSIS — Z794 Long term (current) use of insulin: Secondary | ICD-10-CM | POA: Diagnosis not present

## 2016-07-25 DIAGNOSIS — Z7952 Long term (current) use of systemic steroids: Secondary | ICD-10-CM | POA: Diagnosis not present

## 2016-07-25 DIAGNOSIS — R195 Other fecal abnormalities: Secondary | ICD-10-CM | POA: Diagnosis not present

## 2016-07-25 DIAGNOSIS — J45909 Unspecified asthma, uncomplicated: Secondary | ICD-10-CM | POA: Diagnosis not present

## 2016-07-25 DIAGNOSIS — E538 Deficiency of other specified B group vitamins: Secondary | ICD-10-CM

## 2016-07-25 DIAGNOSIS — Z79899 Other long term (current) drug therapy: Secondary | ICD-10-CM | POA: Diagnosis not present

## 2016-07-25 DIAGNOSIS — D509 Iron deficiency anemia, unspecified: Secondary | ICD-10-CM

## 2016-07-25 DIAGNOSIS — M3214 Glomerular disease in systemic lupus erythematosus: Secondary | ICD-10-CM

## 2016-07-25 DIAGNOSIS — N058 Unspecified nephritic syndrome with other morphologic changes: Secondary | ICD-10-CM

## 2016-07-25 DIAGNOSIS — Z7901 Long term (current) use of anticoagulants: Secondary | ICD-10-CM | POA: Diagnosis not present

## 2016-07-25 DIAGNOSIS — N183 Chronic kidney disease, stage 3 (moderate): Secondary | ICD-10-CM | POA: Diagnosis not present

## 2016-07-25 DIAGNOSIS — I129 Hypertensive chronic kidney disease with stage 1 through stage 4 chronic kidney disease, or unspecified chronic kidney disease: Secondary | ICD-10-CM | POA: Diagnosis not present

## 2016-07-25 DIAGNOSIS — M329 Systemic lupus erythematosus, unspecified: Secondary | ICD-10-CM | POA: Diagnosis not present

## 2016-07-25 LAB — CBC WITH DIFFERENTIAL/PLATELET
BASOS PCT: 0 %
Basophils Absolute: 0 10*3/uL (ref 0.0–0.1)
Eosinophils Absolute: 0.1 10*3/uL (ref 0.0–0.7)
Eosinophils Relative: 1 %
HEMATOCRIT: 29.4 % — AB (ref 36.0–46.0)
Hemoglobin: 9.1 g/dL — ABNORMAL LOW (ref 12.0–15.0)
LYMPHS ABS: 0.8 10*3/uL (ref 0.7–4.0)
LYMPHS PCT: 8 %
MCH: 26.7 pg (ref 26.0–34.0)
MCHC: 31 g/dL (ref 30.0–36.0)
MCV: 86.2 fL (ref 78.0–100.0)
MONO ABS: 0.6 10*3/uL (ref 0.1–1.0)
MONOS PCT: 7 %
NEUTROS ABS: 7.5 10*3/uL (ref 1.7–7.7)
Neutrophils Relative %: 84 %
Platelets: 171 10*3/uL (ref 150–400)
RBC: 3.41 MIL/uL — ABNORMAL LOW (ref 3.87–5.11)
RDW: 16.7 % — AB (ref 11.5–15.5)
WBC: 9 10*3/uL (ref 4.0–10.5)

## 2016-07-25 LAB — FERRITIN: Ferritin: 75 ng/mL (ref 11–307)

## 2016-07-25 MED ORDER — DARBEPOETIN ALFA 150 MCG/0.3ML IJ SOSY
125.0000 ug | PREFILLED_SYRINGE | Freq: Once | INTRAMUSCULAR | Status: AC
  Administered 2016-07-25: 125 ug via SUBCUTANEOUS

## 2016-07-25 MED ORDER — DARBEPOETIN ALFA 150 MCG/0.3ML IJ SOSY
PREFILLED_SYRINGE | INTRAMUSCULAR | Status: AC
  Filled 2016-07-25: qty 0.3

## 2016-07-25 NOTE — Patient Instructions (Addendum)
[place] at [facility] Discharge Instructions  RECOMMENDATIONS MADE BY THE CONSULTANT AND ANY TEST RESULTS WILL BE SENT TO YOUR REFERRING PHYSICIAN.  You saw Dr.Penland today. Talk to Angie for assistance with Aranesp. Continue labs and Aranesp. Follow up in 3 months with labs with Tom. See Amy at checkout for appointments.  Thank you for choosing [place] at [facility] to provide your oncology and hematology care.  To afford each patient quality time with our provider, please arrive at least 15 minutes before your scheduled appointment time.   Beginning January 23rd 2017 lab work for the [facility] will be done in the  Main lab at [facility] on 1st floor. If you have a lab appointment with the [facility] please come in thru the  Main Entrance and check in at the main information desk  You need to re-schedule your appointment should you arrive 10 or more minutes late.  We strive to give you quality time with our providers, and arriving late affects you and other patients whose appointments are after yours.  Also, if you no show three or more times for appointments you may be dismissed from the clinic at the providers discretion.     Again, thank you for choosing [facility].  Our hope is that these requests will decrease the amount of time that you wait before being seen by our physicians.       _____________________________________________________________  Should you have questions after your visit to [facility], please contact our office at [phone] between the hours of 8:30 a.m. and 4:30 p.m.  Voicemails left after 4:30 p.m. will not be returned until the following business day.  For prescription refill requests, have your pharmacy contact our office.         Resources For Cancer Patients and their Caregivers ? American Cancer Society: Can assist with transportation, wigs, general needs, runs  Look Good Feel Better.        ([phone] ? Cancer Care: Provides financial assistance, online support groups, medication/co-pay assistance.  1-800-813-HOPE 804-572-3575) ? [place] Assists [place] cancer patients and their families through emotional , educational and financial support.  873 474 6488 ? [place] DSS Where to apply for food stamps, Medicaid and utility assistance. 5165057767 ? [employer]: Transportation to medical appointments. 7170985015 ? Social Security Administration: May apply for disability if have a Stage IV cancer. (602)863-7393 316-297-0211 ? [employer], Disability and Transit Services: Assists with nutrition, care and transit needs. [place] Support Programs: @10RELATIVEDAYS @ > Cancer Support Group  2nd Tuesday of the month 1pm-2pm, Journey Room  > Creative Journey  3rd Tuesday of the month 1130am-1pm, Journey Room  > Look Good Feel Better  1st Wednesday of the month 10am-12 noon, Journey Room (Call [place] to register [phone])

## 2016-07-25 NOTE — Progress Notes (Signed)
[name] presents today for injection per MD orders. Aranesp 125 mcg administered SQ in left Abdomen. Administration without incident. Patient tolerated well.

## 2016-07-25 NOTE — Progress Notes (Signed)
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 female returns for follow-up of anemia. She has lupus and FSG. She also has a history of folic acid deficiency. She has ongoing Coumadin use because of a history of an upper extremity DVT and atrial flutter. Coumadin is currently being continued for stroke prophylaxis. She has a fistula placed in preparation for dialysis.  [name] returns to the [facility] today accompanied by her husband.  She recently saw Dr. Lowanda Foster.   She had a colonoscopy yesterday. Three polyps were found and sent to pathology.  She would like to talk about her shots because the cost of these is an issue. She receives about $400 a month and her husband makes too much for them to receive further aid. However, he does not make enough for these shots to not cause them financial concerns. She may need to discontinue these.   She has received a flu shot this year.  She feels pretty good. She is able to do everything she needs to do. She is able to get dressed every day. Her appetite is good. She denies SOB or CP.  MEDICAL HISTORY: Past Medical History:  Diagnosis Date  . Anemia   . Asthma   . Atrial flutter [facility])    TEE/DCCV December 2013 - on Xarelto [facility])  . Bone spur of toe    Right 5th toe  . CKD (chronic kidney disease) stage 3, GFR 30-59 ml/min   . DVT of upper extremity (deep vein thrombosis) ([place])    Right basilic vein, June 2774  . Essential hypertension, benign   . SLE (systemic lupus erythematosus) ([place])   . Type 2 diabetes mellitus ([place])   . 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     is allergic to ace inhibitors.  Ms. Beil had no medications administered during this visit.  SURGICAL HISTORY: Past Surgical History:  Procedure Laterality Date  . ABDOMINAL HYSTERECTOMY  1983  . ANKLE SURGERY  1993  . AV FISTULA PLACEMENT Left 03/27/2014   Procedure: ARTERIOVENOUS (AV) FISTULA CREATION;  Surgeon: Rosetta Posner, MD;  Location: [facility];  Service: Vascular;  Laterality: Left;  . BACK SURGERY  1980  . CARDIOVASCULAR STRESS TEST  12/19/2009   no stress induced rhythm abnormalities, ekg negative for ischemia  . CARDIOVERSION  09/16/2012   Procedure: CARDIOVERSION;  Surgeon: Sanda Klein, MD;  Location: [facility] ENDOSCOPY;  Service: Cardiovascular;  Laterality: N/A;  . COLONOSCOPY  09/20/2012   Dr. [name]: multiple tubular adenomas   . COLONOSCOPY  2005   Dr. [name]. Polyps, path unknown   . COLONOSCOPY  N/A 07/24/2016   Procedure: COLONOSCOPY;  Surgeon: [name], MD;  [facility];  Service: Endoscopy;  Laterality: N/A;  9:00 am  . ESOPHAGOGASTRODUODENOSCOPY  09/20/2012   Dr. [name]: duodenal erosion and possible resolving ulcer at the angularis   . TEE WITHOUT CARDIOVERSION  09/16/2012   Procedure: TRANSESOPHAGEAL ECHOCARDIOGRAM (TEE);  Surgeon: Sanda Klein, MD;   Location[facility] ENDOSCOPY;  Service: Cardiovascular;  Laterality: N/A;  . TRANSESOPHAGEAL ECHOCARDIOGRAM WITH CARDIOVERSION  09/16/2012   EF 60-65%, moderate LVH, moderate regurg of the aortic valve, LA moderately dilated    SOCIAL HISTORY: Social History   Social History  . Marital status: Married    Spouse name: N/A  . Number of children: N/A  . Years of education: N/A   Occupational History  . Not on file.   Social History Main Topics  . Smoking status: Former Smoker    Packs/day: 1.00    Years: 30.00    Types: Cigarettes    Quit date: 08/29/2012  . Smokeless tobacco: Never Used     Comment: quit 08-2012  . Alcohol use No  . Drug use: No  . Sexual activity: Yes    Partners: Male   Other Topics Concern  . Not on file   Social History Narrative  . No narrative on file    FAMILY HISTORY: Family History  Problem Relation Age of Onset  . Diabetes Brother   . Diabetes Father   . Hypertension Father   . Hypertension Sister   . Stroke Mother   . Diabetes Sister   . Hypertension Brother   . Colon cancer Neg Hx     Review of Systems  Constitutional: Negative for fever, chills, fatigue/malaise and weight loss.  HENT: Negative for congestion, hearing loss, nosebleeds, sore throat and tinnitus.   Eyes: Negative for blurred vision, double vision, pain and discharge.  Respiratory: Negative for cough, hemoptysis, sputum production, shortness of breath and wheezing.   Cardiovascular: Negative for chest pain, palpitations, claudication, leg swelling and PND.  Gastrointestinal: Positive for constipation. Negative for heartburn, nausea, vomiting, abdominal pain, diarrhea, constipation, blood in stool and melena.  Intermittent constipation managed with milk of magnesia. Genitourinary: Negative for dysuria, urgency, frequency and hematuria.  Musculoskeletal: Positive for joint pain. Negative for myalgias and falls.  Skin: Negative for itching and rash.  Neurological: Negative  for dizziness, tingling, tremors, sensory change, speech change, focal weakness, seizures, loss of consciousness, weakness and headaches.  Endo/Heme/Allergies: Does not bruise/bleed easily.  Psychiatric/Behavioral: Negative for depression, suicidal ideas, memory loss and substance abuse. The patient is not nervous/anxious and does not have insomnia.   14 point review of systems was performed and is negative except as detailed under history of present illness and above   PHYSICAL EXAMINATION  ECOG PERFORMANCE STATUS: 1 - Symptomatic but completely ambulatory  There were no vitals filed for this visit.  Physical Exam  Constitutional: She is oriented to person, place, and time and well-developed, well-nourished, and in no distress.  HENT:  Head: Normocephalic and atraumatic.  Mouth/Throat: Oropharynx is clear and moist.  Dentures top and bottom  Eyes: Conjunctivae and EOM are normal. Pupils are equal, round, and reactive to light.  Neck: Normal range of motion. Neck supple.  Cardiovascular: Normal rate, regular rhythm and normal heart sounds.   Pulmonary/Chest: Effort normal and breath sounds normal.  Abdominal: Soft. Bowel sounds are normal.  Musculoskeletal: Normal range of motion.  Neurological: She is alert and oriented to person, place, and  time. Gait normal.  Skin: Skin is warm and dry.  Nursing note and vitals reviewed.   LABORATORY DATA: I have reviewed the data as listed.  Results for ADISYNN, SULEIMAN (MRN [id number])   Ref. Range 07/25/2016 11:23  Ferritin Latest Ref Range: 11 - 307 ng/mL 75  WBC Latest Ref Range: 4.0 - 10.5 K/uL 9.0  RBC Latest Ref Range: 3.87 - 5.11 MIL/uL 3.41 (L)  Hemoglobin Latest Ref Range: 12.0 - 15.0 g/dL 9.1 (L)  HCT Latest Ref Range: 36.0 - 46.0 % 29.4 (L)  MCV Latest Ref Range: 78.0 - 100.0 fL 86.2  MCH Latest Ref Range: 26.0 - 34.0 pg 26.7  MCHC Latest Ref Range: 30.0 - 36.0 g/dL 31.0  RDW Latest Ref Range: 11.5 - 15.5 % 16.7 (H)    Platelets Latest Ref Range: 150 - 400 K/uL 171  Neutrophils Latest Units: % 84  Lymphocytes Latest Units: % 8  Monocytes Relative Latest Units: % 7  Eosinophil Latest Units: % 1  Basophil Latest Units: % 0  NEUT# Latest Ref Range: 1.7 - 7.7 K/uL 7.5  Lymphocyte # Latest Ref Range: 0.7 - 4.0 K/uL 0.8  Monocyte # Latest Ref Range: 0.1 - 1.0 K/uL 0.6  Eosinophils Absolute Latest Ref Range: 0.0 - 0.7 K/uL 0.1  Basophils Absolute Latest Ref Range: 0.0 - 0.1 K/uL 0.0    CMP     Component Value Date/Time   NA 142 07/10/2016 1344   K 4.5 07/10/2016 1344   CL 102 07/10/2016 1344   CO2 21 07/10/2016 1344   GLUCOSE 137 (H) 07/10/2016 1344   GLUCOSE 121 (H) 02/20/2016 0956   BUN 75 (H) 07/10/2016 1344   CREATININE 2.62 (H) 07/10/2016 1344   CALCIUM 8.4 (L) 07/10/2016 1344   CALCIUM 7.0 (L) 03/16/2014 0835   PROT 7.2 07/10/2016 1344   ALBUMIN 3.9 07/10/2016 1344   AST 17 07/10/2016 1344   ALT 13 07/10/2016 1344   ALKPHOS 56 07/10/2016 1344   BILITOT 0.3 07/10/2016 1344   GFRNONAA 18 (L) 07/10/2016 1344   GFRAA 21 (L) 07/10/2016 1344   Results for LOVINIA, SNARE (MRN [id number])   Ref. Range 02/20/2016 09:56 03/05/2016 12:50 03/19/2016 12:39 04/03/2016 14:57 04/16/2016 12:03 05/01/2016 09:57 05/15/2016 12:48 05/29/2016 12:47  Hemoglobin Latest Ref Range: 12.0 - 15.0 g/dL 10.2 (L) 10.9 (L) 10.1 (L) 9.4 (L) 9.6 (L) 9.9 (L) 9.4 (L) 9.2 (L)    ASSESSMENT and THERAPY PLAN:   Anemia Iron deficiency Lupus Stage IV Kidney disease, FSG History of heme positive stool   70 year old female with lupus and kidney disease, FSG. She has underlying anemia which is certainly multi factorial including folic acid deficiency, her chronic kidney disease, and anemia of chronic disease from her lupus.   Labs reviewed. Results are noted above. Goal is to maintain ferritin greater than 100 ng/ml. She will be set up for additional IV iron.   She had a colonoscopy yesterday, 07/24/16. Polyps were found and  sent to pathology. These results are pending.   The patient has financial concerns regarding aranesp. I will speak with Angie about assistance programs. I also spoke with the patient about ongoing surveillance of her blood counts and transfusing as needed. Her husband would prefer for [place] to look into assistance programs and to continue the shots for the time being.   She will return for follow up in 3 to 4 months. Continue with counts and iron levels as previously ordered.  All questions were answered. The patient  knows to call the clinic with any problems, questions or concerns. We can certainly see the patient much sooner if necessary. This note was electronically signed.  This document serves as a record of services personally performed by Ancil Linsey, MD. It was created on her behalf by Arlyce Harman, a trained medical scribe. The creation of this record is based on the scribe's personal observations and the provider's statements to them. This document has been checked and approved by the attending provider.  I have reviewed the above documentation for accuracy and completeness, and I agree with the above. Molli Hazard, MD

## 2016-07-28 ENCOUNTER — Encounter (HOSPITAL_COMMUNITY): Payer: Self-pay | Admitting: Internal Medicine

## 2016-07-30 ENCOUNTER — Ambulatory Visit (INDEPENDENT_AMBULATORY_CARE_PROVIDER_SITE_OTHER): Payer: Medicare Other | Admitting: *Deleted

## 2016-07-30 DIAGNOSIS — I4892 Unspecified atrial flutter: Secondary | ICD-10-CM

## 2016-07-30 DIAGNOSIS — Z5181 Encounter for therapeutic drug level monitoring: Secondary | ICD-10-CM

## 2016-07-30 LAB — POCT INR: INR: 1.5

## 2016-07-31 DIAGNOSIS — M810 Age-related osteoporosis without current pathological fracture: Secondary | ICD-10-CM | POA: Diagnosis not present

## 2016-07-31 DIAGNOSIS — M150 Primary generalized (osteo)arthritis: Secondary | ICD-10-CM | POA: Diagnosis not present

## 2016-07-31 DIAGNOSIS — N189 Chronic kidney disease, unspecified: Secondary | ICD-10-CM | POA: Diagnosis not present

## 2016-07-31 DIAGNOSIS — Z780 Asymptomatic menopausal state: Secondary | ICD-10-CM | POA: Diagnosis not present

## 2016-07-31 DIAGNOSIS — M329 Systemic lupus erythematosus, unspecified: Secondary | ICD-10-CM | POA: Diagnosis not present

## 2016-08-05 DIAGNOSIS — L89892 Pressure ulcer of other site, stage 2: Secondary | ICD-10-CM | POA: Diagnosis not present

## 2016-08-05 DIAGNOSIS — E1151 Type 2 diabetes mellitus with diabetic peripheral angiopathy without gangrene: Secondary | ICD-10-CM | POA: Diagnosis not present

## 2016-08-05 DIAGNOSIS — E1140 Type 2 diabetes mellitus with diabetic neuropathy, unspecified: Secondary | ICD-10-CM | POA: Diagnosis not present

## 2016-08-11 ENCOUNTER — Encounter (HOSPITAL_COMMUNITY): Payer: Medicare Other | Attending: Hematology & Oncology

## 2016-08-11 ENCOUNTER — Encounter (HOSPITAL_COMMUNITY): Payer: Medicare Other

## 2016-08-11 VITALS — BP 160/44 | HR 61 | Temp 97.7°F | Resp 20

## 2016-08-11 DIAGNOSIS — R195 Other fecal abnormalities: Secondary | ICD-10-CM

## 2016-08-11 DIAGNOSIS — N184 Chronic kidney disease, stage 4 (severe): Secondary | ICD-10-CM | POA: Insufficient documentation

## 2016-08-11 DIAGNOSIS — D509 Iron deficiency anemia, unspecified: Secondary | ICD-10-CM | POA: Insufficient documentation

## 2016-08-11 DIAGNOSIS — N058 Unspecified nephritic syndrome with other morphologic changes: Secondary | ICD-10-CM | POA: Insufficient documentation

## 2016-08-11 DIAGNOSIS — D631 Anemia in chronic kidney disease: Secondary | ICD-10-CM | POA: Diagnosis not present

## 2016-08-11 DIAGNOSIS — N185 Chronic kidney disease, stage 5: Secondary | ICD-10-CM | POA: Diagnosis not present

## 2016-08-11 LAB — CBC WITH DIFFERENTIAL/PLATELET
Basophils Absolute: 0 10*3/uL (ref 0.0–0.1)
Basophils Relative: 0 %
Eosinophils Absolute: 0.1 10*3/uL (ref 0.0–0.7)
Eosinophils Relative: 2 %
HEMATOCRIT: 30.3 % — AB (ref 36.0–46.0)
HEMOGLOBIN: 9.5 g/dL — AB (ref 12.0–15.0)
LYMPHS ABS: 0.9 10*3/uL (ref 0.7–4.0)
LYMPHS PCT: 17 %
MCH: 26.8 pg (ref 26.0–34.0)
MCHC: 31.4 g/dL (ref 30.0–36.0)
MCV: 85.6 fL (ref 78.0–100.0)
MONO ABS: 0.6 10*3/uL (ref 0.1–1.0)
MONOS PCT: 11 %
NEUTROS ABS: 3.9 10*3/uL (ref 1.7–7.7)
NEUTROS PCT: 70 %
Platelets: 287 10*3/uL (ref 150–400)
RBC: 3.54 MIL/uL — ABNORMAL LOW (ref 3.87–5.11)
RDW: 16.6 % — AB (ref 11.5–15.5)
WBC: 5.6 10*3/uL (ref 4.0–10.5)

## 2016-08-11 LAB — FERRITIN: Ferritin: 96 ng/mL (ref 11–307)

## 2016-08-11 MED ORDER — DARBEPOETIN ALFA 150 MCG/0.3ML IJ SOSY
125.0000 ug | PREFILLED_SYRINGE | Freq: Once | INTRAMUSCULAR | Status: AC
  Administered 2016-08-11: 125 ug via SUBCUTANEOUS

## 2016-08-11 MED ORDER — DARBEPOETIN ALFA 150 MCG/0.3ML IJ SOSY
PREFILLED_SYRINGE | INTRAMUSCULAR | Status: AC
  Filled 2016-08-11: qty 0.3

## 2016-08-11 NOTE — Progress Notes (Signed)
[name] presents today for injection per MD orders. Aranesp 125 mcg administered SQ in left Abdomen. Administration without incident. Patient tolerated well.

## 2016-08-11 NOTE — Patient Instructions (Signed)
[place] at [facility] Discharge Instructions  RECOMMENDATIONS MADE BY THE CONSULTANT AND ANY TEST RESULTS WILL BE SENT TO YOUR REFERRING PHYSICIAN.  Hemoglobin 9.5. Aranesp 125 mcg injection given as ordered. Return as scheduled.  Thank you for choosing [place] at [facility] to provide your oncology and hematology care.  To afford each patient quality time with our provider, please arrive at least 15 minutes before your scheduled appointment time.   Beginning January 23rd 2017 lab work for the [facility] will be done in the  Main lab at [facility] on 1st floor. If you have a lab appointment with the [facility] please come in thru the  Main Entrance and check in at the main information desk  You need to re-schedule your appointment should you arrive 10 or more minutes late.  We strive to give you quality time with our providers, and arriving late affects you and other patients whose appointments are after yours.  Also, if you no show three or more times for appointments you may be dismissed from the clinic at the providers discretion.     Again, thank you for choosing [facility].  Our hope is that these requests will decrease the amount of time that you wait before being seen by our physicians.       _____________________________________________________________  Should you have questions after your visit to [facility], please contact our office at [phone] between the hours of 8:30 a.m. and 4:30 p.m.  Voicemails left after 4:30 p.m. will not be returned until the following business day.  For prescription refill requests, have your pharmacy contact our office.         Resources For Cancer Patients and their Caregivers ? American Cancer Society: Can assist with transportation, wigs, general needs, runs Look Good Feel Better.        325-684-0134 ? Cancer Care: Provides financial assistance,  online support groups, medication/co-pay assistance.  1-800-813-HOPE 306-280-9788) ? [place] Assists [place] cancer patients and their families through emotional , educational and financial support.  (210)057-2060 ? [place] DSS Where to apply for food stamps, Medicaid and utility assistance. (762) 102-0269 ? RCATS: Transportation to medical appointments. 7573631915 ? Social Security Administration: May apply for disability if have a Stage IV cancer. ([phone] 427-5381 2547871364 ? [employer], Disability and Transit Services: Assists with nutrition, care and transit needs. [place] Support Programs: @10RELATIVEDAYS @ > Cancer Support Group  2nd Tuesday of the month 1pm-2pm, Journey Room  > Creative Journey  3rd Tuesday of the month 1130am-1pm, Journey Room  > Look Good Feel Better  1st Wednesday of the month 10am-12 noon, Journey Room (Call [place] to register [phone])

## 2016-08-18 ENCOUNTER — Ambulatory Visit (INDEPENDENT_AMBULATORY_CARE_PROVIDER_SITE_OTHER): Payer: Medicare Other | Admitting: *Deleted

## 2016-08-18 DIAGNOSIS — Z5181 Encounter for therapeutic drug level monitoring: Secondary | ICD-10-CM

## 2016-08-18 DIAGNOSIS — I4892 Unspecified atrial flutter: Secondary | ICD-10-CM | POA: Diagnosis not present

## 2016-08-18 LAB — POCT INR: INR: 2.8

## 2016-08-23 ENCOUNTER — Encounter (HOSPITAL_COMMUNITY): Payer: Self-pay | Admitting: Hematology & Oncology

## 2016-08-26 ENCOUNTER — Other Ambulatory Visit: Payer: Self-pay | Admitting: Cardiology

## 2016-08-26 ENCOUNTER — Encounter (HOSPITAL_COMMUNITY): Payer: Medicare Other

## 2016-08-26 ENCOUNTER — Encounter (HOSPITAL_BASED_OUTPATIENT_CLINIC_OR_DEPARTMENT_OTHER): Payer: Medicare Other

## 2016-08-26 VITALS — BP 154/49 | HR 69 | Temp 97.7°F | Resp 18

## 2016-08-26 DIAGNOSIS — R195 Other fecal abnormalities: Secondary | ICD-10-CM

## 2016-08-26 DIAGNOSIS — D631 Anemia in chronic kidney disease: Secondary | ICD-10-CM | POA: Diagnosis not present

## 2016-08-26 DIAGNOSIS — N184 Chronic kidney disease, stage 4 (severe): Principal | ICD-10-CM

## 2016-08-26 DIAGNOSIS — N189 Chronic kidney disease, unspecified: Secondary | ICD-10-CM | POA: Diagnosis not present

## 2016-08-26 DIAGNOSIS — D509 Iron deficiency anemia, unspecified: Secondary | ICD-10-CM | POA: Diagnosis not present

## 2016-08-26 DIAGNOSIS — N058 Unspecified nephritic syndrome with other morphologic changes: Secondary | ICD-10-CM

## 2016-08-26 LAB — CBC WITH DIFFERENTIAL/PLATELET
BASOS PCT: 0 %
Basophils Absolute: 0 10*3/uL (ref 0.0–0.1)
EOS ABS: 0.1 10*3/uL (ref 0.0–0.7)
Eosinophils Relative: 1 %
HCT: 31.1 % — ABNORMAL LOW (ref 36.0–46.0)
HEMOGLOBIN: 9.6 g/dL — AB (ref 12.0–15.0)
Lymphocytes Relative: 15 %
Lymphs Abs: 1.1 10*3/uL (ref 0.7–4.0)
MCH: 26.5 pg (ref 26.0–34.0)
MCHC: 30.9 g/dL (ref 30.0–36.0)
MCV: 85.9 fL (ref 78.0–100.0)
Monocytes Absolute: 0.3 10*3/uL (ref 0.1–1.0)
Monocytes Relative: 4 %
NEUTROS PCT: 80 %
Neutro Abs: 6 10*3/uL (ref 1.7–7.7)
Platelets: 247 10*3/uL (ref 150–400)
RBC: 3.62 MIL/uL — AB (ref 3.87–5.11)
RDW: 16.4 % — ABNORMAL HIGH (ref 11.5–15.5)
WBC: 7.5 10*3/uL (ref 4.0–10.5)

## 2016-08-26 MED ORDER — DARBEPOETIN ALFA 150 MCG/0.3ML IJ SOSY
125.0000 ug | PREFILLED_SYRINGE | Freq: Once | INTRAMUSCULAR | Status: AC
  Administered 2016-08-26: 125 ug via SUBCUTANEOUS
  Filled 2016-08-26: qty 0.3

## 2016-08-26 NOTE — Patient Instructions (Signed)
[place] at [facility] Discharge Instructions  RECOMMENDATIONS MADE BY THE CONSULTANT AND ANY TEST RESULTS WILL BE SENT TO YOUR REFERRING PHYSICIAN.  Hemoglobin 9.6 today. Aranesp 125 mcg injection given as ordered. Return as scheduled.  Thank you for choosing [place] at [facility] to provide your oncology and hematology care.  To afford each patient quality time with our provider, please arrive at least 15 minutes before your scheduled appointment time.   Beginning January 23rd 2017 lab work for the [facility] will be done in the  Main lab at [facility] on 1st floor. If you have a lab appointment with the [facility] please come in thru the  Main Entrance and check in at the main information desk  You need to re-schedule your appointment should you arrive 10 or more minutes late.  We strive to give you quality time with our providers, and arriving late affects you and other patients whose appointments are after yours.  Also, if you no show three or more times for appointments you may be dismissed from the clinic at the providers discretion.     Again, thank you for choosing [facility].  Our hope is that these requests will decrease the amount of time that you wait before being seen by our physicians.       _____________________________________________________________  Should you have questions after your visit to [facility], please contact our office at [phone] between the hours of 8:30 a.m. and 4:30 p.m.  Voicemails left after 4:30 p.m. will not be returned until the following business day.  For prescription refill requests, have your pharmacy contact our office.         Resources For Cancer Patients and their Caregivers ? American Cancer Society: Can assist with transportation, wigs, general needs, runs Look Good Feel Better.        757-484-5993 ? Cancer Care: Provides financial  assistance, online support groups, medication/co-pay assistance.  1-800-813-HOPE 248-139-3133) ? [place] Assists [place] cancer patients and their families through emotional , educational and financial support.  251-173-4398 ? [place] DSS Where to apply for food stamps, Medicaid and utility assistance. 239-561-7177 ? RCATS: Transportation to medical appointments. 581-062-8333 ? Social Security Administration: May apply for disability if have a Stage IV cancer. 406-822-5881 (628)187-3767 ? [employer], Disability and Transit Services: Assists with nutrition, care and transit needs. [place] Support Programs: @10RELATIVEDAYS @ > Cancer Support Group  2nd Tuesday of the month 1pm-2pm, Journey Room  > Creative Journey  3rd Tuesday of the month 1130am-1pm, Journey Room  > Look Good Feel Better  1st Wednesday of the month 10am-12 noon, Journey Room (Call [place] to register [phone])

## 2016-08-26 NOTE — Progress Notes (Signed)
[name] presents today for injection per MD orders. Aranesp 125 mcg administered SQ in right Abdomen. Administration without incident. Patient tolerated well.

## 2016-08-27 DIAGNOSIS — I10 Essential (primary) hypertension: Secondary | ICD-10-CM | POA: Diagnosis not present

## 2016-08-27 DIAGNOSIS — Z1389 Encounter for screening for other disorder: Secondary | ICD-10-CM | POA: Diagnosis not present

## 2016-08-27 DIAGNOSIS — E119 Type 2 diabetes mellitus without complications: Secondary | ICD-10-CM | POA: Diagnosis not present

## 2016-08-27 DIAGNOSIS — M1991 Primary osteoarthritis, unspecified site: Secondary | ICD-10-CM | POA: Diagnosis not present

## 2016-08-27 DIAGNOSIS — M545 Low back pain: Secondary | ICD-10-CM | POA: Diagnosis not present

## 2016-08-28 DIAGNOSIS — H401131 Primary open-angle glaucoma, bilateral, mild stage: Secondary | ICD-10-CM | POA: Diagnosis not present

## 2016-08-28 DIAGNOSIS — Z961 Presence of intraocular lens: Secondary | ICD-10-CM | POA: Diagnosis not present

## 2016-08-28 DIAGNOSIS — E113213 Type 2 diabetes mellitus with mild nonproliferative diabetic retinopathy with macular edema, bilateral: Secondary | ICD-10-CM | POA: Diagnosis not present

## 2016-08-28 DIAGNOSIS — H43821 Vitreomacular adhesion, right eye: Secondary | ICD-10-CM | POA: Diagnosis not present

## 2016-09-01 ENCOUNTER — Other Ambulatory Visit (HOSPITAL_COMMUNITY): Payer: Self-pay | Admitting: Family Medicine

## 2016-09-01 DIAGNOSIS — Z1231 Encounter for screening mammogram for malignant neoplasm of breast: Secondary | ICD-10-CM

## 2016-09-09 ENCOUNTER — Encounter (HOSPITAL_COMMUNITY): Payer: Medicare Other

## 2016-09-09 ENCOUNTER — Encounter (HOSPITAL_COMMUNITY): Payer: Medicare Other | Attending: Hematology & Oncology

## 2016-09-09 VITALS — BP 154/65 | HR 64 | Temp 97.8°F | Resp 18

## 2016-09-09 DIAGNOSIS — N189 Chronic kidney disease, unspecified: Secondary | ICD-10-CM | POA: Diagnosis not present

## 2016-09-09 DIAGNOSIS — D631 Anemia in chronic kidney disease: Secondary | ICD-10-CM | POA: Diagnosis not present

## 2016-09-09 DIAGNOSIS — E1140 Type 2 diabetes mellitus with diabetic neuropathy, unspecified: Secondary | ICD-10-CM | POA: Diagnosis not present

## 2016-09-09 DIAGNOSIS — D509 Iron deficiency anemia, unspecified: Secondary | ICD-10-CM | POA: Diagnosis not present

## 2016-09-09 DIAGNOSIS — N184 Chronic kidney disease, stage 4 (severe): Secondary | ICD-10-CM | POA: Insufficient documentation

## 2016-09-09 DIAGNOSIS — N058 Unspecified nephritic syndrome with other morphologic changes: Secondary | ICD-10-CM

## 2016-09-09 DIAGNOSIS — R195 Other fecal abnormalities: Secondary | ICD-10-CM | POA: Diagnosis not present

## 2016-09-09 DIAGNOSIS — E1151 Type 2 diabetes mellitus with diabetic peripheral angiopathy without gangrene: Secondary | ICD-10-CM | POA: Diagnosis not present

## 2016-09-09 DIAGNOSIS — L89892 Pressure ulcer of other site, stage 2: Secondary | ICD-10-CM | POA: Diagnosis not present

## 2016-09-09 LAB — CBC WITH DIFFERENTIAL/PLATELET
Basophils Absolute: 0 10*3/uL (ref 0.0–0.1)
Basophils Relative: 1 %
Eosinophils Absolute: 0.1 10*3/uL (ref 0.0–0.7)
Eosinophils Relative: 2 %
HCT: 30.8 % — ABNORMAL LOW (ref 36.0–46.0)
HEMOGLOBIN: 9.2 g/dL — AB (ref 12.0–15.0)
LYMPHS ABS: 1.1 10*3/uL (ref 0.7–4.0)
LYMPHS PCT: 17 %
MCH: 26.2 pg (ref 26.0–34.0)
MCHC: 29.9 g/dL — ABNORMAL LOW (ref 30.0–36.0)
MCV: 87.7 fL (ref 78.0–100.0)
Monocytes Absolute: 0.6 10*3/uL (ref 0.1–1.0)
Monocytes Relative: 9 %
NEUTROS ABS: 4.5 10*3/uL (ref 1.7–7.7)
NEUTROS PCT: 71 %
Platelets: 247 10*3/uL (ref 150–400)
RBC: 3.51 MIL/uL — AB (ref 3.87–5.11)
RDW: 16.7 % — ABNORMAL HIGH (ref 11.5–15.5)
WBC: 6.3 10*3/uL (ref 4.0–10.5)

## 2016-09-09 MED ORDER — DARBEPOETIN ALFA 150 MCG/0.3ML IJ SOSY
PREFILLED_SYRINGE | INTRAMUSCULAR | Status: AC
  Filled 2016-09-09: qty 0.3

## 2016-09-09 MED ORDER — DARBEPOETIN ALFA 150 MCG/0.3ML IJ SOSY
125.0000 ug | PREFILLED_SYRINGE | Freq: Once | INTRAMUSCULAR | Status: AC
  Administered 2016-09-09: 125 ug via SUBCUTANEOUS

## 2016-09-09 NOTE — Patient Instructions (Signed)
[place] at [facility] Discharge Instructions  RECOMMENDATIONS MADE BY THE CONSULTANT AND ANY TEST RESULTS WILL BE SENT TO YOUR REFERRING PHYSICIAN.  Received Aranesp injection today. Follow-up as scheduled. Call clinic for any questions or concerns  Thank you for choosing [place] at [facility] to provide your oncology and hematology care.  To afford each patient quality time with our provider, please arrive at least 15 minutes before your scheduled appointment time.   Beginning January 23rd 2017 lab work for the [facility] will be done in the  Main lab at [facility] on 1st floor. If you have a lab appointment with the [facility] please come in thru the  Main Entrance and check in at the main information desk  You need to re-schedule your appointment should you arrive 10 or more minutes late.  We strive to give you quality time with our providers, and arriving late affects you and other patients whose appointments are after yours.  Also, if you no show three or more times for appointments you may be dismissed from the clinic at the providers discretion.     Again, thank you for choosing [facility].  Our hope is that these requests will decrease the amount of time that you wait before being seen by our physicians.       _____________________________________________________________  Should you have questions after your visit to [facility], please contact our office at [phone] between the hours of 8:30 a.m. and 4:30 p.m.  Voicemails left after 4:30 p.m. will not be returned until the following business day.  For prescription refill requests, have your pharmacy contact our office.         Resources For Cancer Patients and their Caregivers ? American Cancer Society: Can assist with transportation, wigs, general needs, runs Look Good Feel Better.        ([phone] 024-8579 ? Cancer Care: Provides  financial assistance, online support groups, medication/co-pay assistance.  1-800-813-HOPE (364)797-6258) ? [place] Assists [place] cancer patients and their families through emotional , educational and financial support.  (318) 020-4636 ? [place] DSS Where to apply for food stamps, Medicaid and utility assistance. 973-797-2983 ? RCATS: Transportation to medical appointments. 430-261-2994 ? Social Security Administration: May apply for disability if have a Stage IV cancer. (203)713-3972 (902)517-8523 ? [employer], Disability and Transit Services: Assists with nutrition, care and transit needs. [place] Support Programs: @10RELATIVEDAYS @ > Cancer Support Group  2nd Tuesday of the month 1pm-2pm, Journey Room  > Creative Journey  3rd Tuesday of the month 1130am-1pm, Journey Room  > Look Good Feel Better  1st Wednesday of the month 10am-12 noon, Journey Room (Call [place] to register [phone])

## 2016-09-09 NOTE — Progress Notes (Signed)
HALI BALGOBIN tolerated Aranesp injection well without complaints or incident. Hgb 9.2 VSS Pt discharged via wheelchair in satisfactory condition accompanied by family member

## 2016-09-15 ENCOUNTER — Other Ambulatory Visit: Payer: Self-pay | Admitting: Cardiology

## 2016-09-15 ENCOUNTER — Ambulatory Visit (INDEPENDENT_AMBULATORY_CARE_PROVIDER_SITE_OTHER): Payer: Medicare Other | Admitting: *Deleted

## 2016-09-15 DIAGNOSIS — Z5181 Encounter for therapeutic drug level monitoring: Secondary | ICD-10-CM

## 2016-09-15 DIAGNOSIS — I4892 Unspecified atrial flutter: Secondary | ICD-10-CM | POA: Diagnosis not present

## 2016-09-15 LAB — POCT INR: INR: 3.4

## 2016-09-23 ENCOUNTER — Encounter (HOSPITAL_BASED_OUTPATIENT_CLINIC_OR_DEPARTMENT_OTHER): Payer: Medicare Other

## 2016-09-23 ENCOUNTER — Encounter (HOSPITAL_COMMUNITY): Payer: Medicare Other

## 2016-09-23 ENCOUNTER — Encounter (HOSPITAL_COMMUNITY): Payer: Self-pay

## 2016-09-23 VITALS — BP 147/45 | HR 59 | Temp 97.7°F | Resp 16

## 2016-09-23 DIAGNOSIS — N184 Chronic kidney disease, stage 4 (severe): Secondary | ICD-10-CM

## 2016-09-23 DIAGNOSIS — D631 Anemia in chronic kidney disease: Secondary | ICD-10-CM

## 2016-09-23 DIAGNOSIS — R195 Other fecal abnormalities: Secondary | ICD-10-CM | POA: Diagnosis not present

## 2016-09-23 DIAGNOSIS — D509 Iron deficiency anemia, unspecified: Secondary | ICD-10-CM

## 2016-09-23 LAB — CBC WITH DIFFERENTIAL/PLATELET
BASOS PCT: 0 %
Basophils Absolute: 0 10*3/uL (ref 0.0–0.1)
EOS ABS: 0.1 10*3/uL (ref 0.0–0.7)
Eosinophils Relative: 2 %
HEMATOCRIT: 31.1 % — AB (ref 36.0–46.0)
HEMOGLOBIN: 9.4 g/dL — AB (ref 12.0–15.0)
Lymphocytes Relative: 14 %
Lymphs Abs: 0.9 10*3/uL (ref 0.7–4.0)
MCH: 26.3 pg (ref 26.0–34.0)
MCHC: 30.2 g/dL (ref 30.0–36.0)
MCV: 87.1 fL (ref 78.0–100.0)
MONOS PCT: 8 %
Monocytes Absolute: 0.5 10*3/uL (ref 0.1–1.0)
NEUTROS ABS: 4.8 10*3/uL (ref 1.7–7.7)
NEUTROS PCT: 75 %
Platelets: 261 10*3/uL (ref 150–400)
RBC: 3.57 MIL/uL — AB (ref 3.87–5.11)
RDW: 16.4 % — ABNORMAL HIGH (ref 11.5–15.5)
WBC: 6.4 10*3/uL (ref 4.0–10.5)

## 2016-09-23 MED ORDER — DARBEPOETIN ALFA 150 MCG/0.3ML IJ SOSY
125.0000 ug | PREFILLED_SYRINGE | Freq: Once | INTRAMUSCULAR | Status: AC
  Administered 2016-09-23: 125 ug via SUBCUTANEOUS

## 2016-09-23 MED ORDER — DARBEPOETIN ALFA 150 MCG/0.3ML IJ SOSY
PREFILLED_SYRINGE | INTRAMUSCULAR | Status: AC
  Filled 2016-09-23: qty 0.3

## 2016-09-23 NOTE — Progress Notes (Signed)
[name] presents today for injection per MD orders. Aranesp 110mcg administered SQ in right Abdomen. Administration without incident. Patient tolerated well.  Vs stable, patient discharged via wheelchair from clinic, follow as scheduled.

## 2016-09-23 NOTE — Patient Instructions (Signed)
[place] at [facility]  Discharge Instructions:  Aranesp today Follow up as scheduled. _______________________________________________________________  Thank you for choosing [place] at [facility] to provide your oncology and hematology care.  To afford each patient quality time with our providers, please arrive at least 15 minutes before your scheduled appointment.  You need to re-schedule your appointment if you arrive 10 or more minutes late.  We strive to give you quality time with our providers, and arriving late affects you and other patients whose appointments are after yours.  Also, if you no show three or more times for appointments you may be dismissed from the clinic.  Again, thank you for choosing [place] at [facility] hope is that these requests will allow you access to exceptional care and in a timely manner. _______________________________________________________________  If you have questions after your visit, please contact our office at [phone] between the hours of 8:30 a.m. and 5:00 p.m. Voicemails left after 4:30 p.m. will not be returned until the following business day. _______________________________________________________________  For prescription refill requests, have your pharmacy contact our office. _______________________________________________________________  Recommendations made by the consultant and any test results will be sent to your referring physician. _______________________________________________________________

## 2016-09-25 ENCOUNTER — Ambulatory Visit (HOSPITAL_COMMUNITY)
Admission: RE | Admit: 2016-09-25 | Discharge: 2016-09-25 | Disposition: A | Discharge status: Home / Self Care | Payer: Medicare Other | Source: Ambulatory Visit | Attending: Family Medicine | Admitting: Family Medicine

## 2016-09-25 DIAGNOSIS — Z1231 Encounter for screening mammogram for malignant neoplasm of breast: Secondary | ICD-10-CM | POA: Insufficient documentation

## 2016-09-26 DIAGNOSIS — J0100 Acute maxillary sinusitis, unspecified: Secondary | ICD-10-CM | POA: Diagnosis not present

## 2016-09-26 DIAGNOSIS — H6122 Impacted cerumen, left ear: Secondary | ICD-10-CM | POA: Diagnosis not present

## 2016-09-26 DIAGNOSIS — Z1389 Encounter for screening for other disorder: Secondary | ICD-10-CM | POA: Diagnosis not present

## 2016-10-07 ENCOUNTER — Encounter (HOSPITAL_COMMUNITY): Payer: Medicare Other

## 2016-10-07 ENCOUNTER — Encounter (HOSPITAL_COMMUNITY): Payer: Self-pay

## 2016-10-07 ENCOUNTER — Encounter (HOSPITAL_COMMUNITY): Payer: Medicare Other | Attending: Hematology & Oncology

## 2016-10-07 VITALS — BP 122/91 | HR 59 | Temp 97.8°F | Resp 16

## 2016-10-07 DIAGNOSIS — N184 Chronic kidney disease, stage 4 (severe): Principal | ICD-10-CM

## 2016-10-07 DIAGNOSIS — D509 Iron deficiency anemia, unspecified: Secondary | ICD-10-CM

## 2016-10-07 DIAGNOSIS — D631 Anemia in chronic kidney disease: Secondary | ICD-10-CM

## 2016-10-07 DIAGNOSIS — N058 Unspecified nephritic syndrome with other morphologic changes: Secondary | ICD-10-CM

## 2016-10-07 DIAGNOSIS — N189 Chronic kidney disease, unspecified: Secondary | ICD-10-CM | POA: Diagnosis not present

## 2016-10-07 DIAGNOSIS — R195 Other fecal abnormalities: Secondary | ICD-10-CM

## 2016-10-07 LAB — CBC WITH DIFFERENTIAL/PLATELET
BASOS PCT: 0 %
Basophils Absolute: 0 10*3/uL (ref 0.0–0.1)
EOS PCT: 2 %
Eosinophils Absolute: 0.1 10*3/uL (ref 0.0–0.7)
HCT: 27.2 % — ABNORMAL LOW (ref 36.0–46.0)
Hemoglobin: 8.7 g/dL — ABNORMAL LOW (ref 12.0–15.0)
Lymphocytes Relative: 10 %
Lymphs Abs: 0.7 10*3/uL (ref 0.7–4.0)
MCH: 27.2 pg (ref 26.0–34.0)
MCHC: 32 g/dL (ref 30.0–36.0)
MCV: 85 fL (ref 78.0–100.0)
MONO ABS: 0.6 10*3/uL (ref 0.1–1.0)
Monocytes Relative: 8 %
Neutro Abs: 5.7 10*3/uL (ref 1.7–7.7)
Neutrophils Relative %: 80 %
PLATELETS: 206 10*3/uL (ref 150–400)
RBC: 3.2 MIL/uL — ABNORMAL LOW (ref 3.87–5.11)
RDW: 16.4 % — AB (ref 11.5–15.5)
WBC: 7.2 10*3/uL (ref 4.0–10.5)

## 2016-10-07 MED ORDER — DARBEPOETIN ALFA 150 MCG/0.3ML IJ SOSY
125.0000 ug | PREFILLED_SYRINGE | Freq: Once | INTRAMUSCULAR | Status: AC
  Administered 2016-10-07: 125 ug via SUBCUTANEOUS

## 2016-10-07 MED ORDER — DARBEPOETIN ALFA 150 MCG/0.3ML IJ SOSY
PREFILLED_SYRINGE | INTRAMUSCULAR | Status: AC
  Filled 2016-10-07: qty 0.3

## 2016-10-07 NOTE — Progress Notes (Signed)
[name] presents today for injection per MD orders. Aranesp 183mcg administered SQ in left Abdomen. Administration without incident. Patient tolerated well.

## 2016-10-07 NOTE — Patient Instructions (Signed)
[facility] at [facility] Discharge Instructions  RECOMMENDATIONS MADE BY THE CONSULTANT AND ANY TEST RESULTS WILL BE SENT TO YOUR REFERRING PHYSICIAN.  Aranesp today.    Thank you for choosing [facility] at [facility] to provide your oncology and hematology care.  To afford each patient quality time with our provider, please arrive at least 15 minutes before your scheduled appointment time.    If you have a lab appointment with the Cancer Center please come in thru the  Main Entrance and check in at the main information desk  You need to re-schedule your appointment should you arrive 10 or more minutes late.  We strive to give you quality time with our providers, and arriving late affects you and other patients whose appointments are after yours.  Also, if you no show three or more times for appointments you may be dismissed from the clinic at the providers discretion.     Again, thank you for choosing [facility].  Our hope is that these requests will decrease the amount of time that you wait before being seen by our physicians.       _____________________________________________________________  Should you have questions after your visit to [facility], please contact our office at [phone] between the hours of 8:30 a.m. and 4:30 p.m.  Voicemails left after 4:30 p.m. will not be returned until the following business day.  For prescription refill requests, have your pharmacy contact our office.       Resources For Cancer Patients and their Caregivers ? American Cancer Society: Can assist with transportation, wigs, general needs, runs Look Good Feel Better.        1-888-227-6333 ? Cancer Care: Provides financial assistance, online support groups, medication/co-pay assistance.  1-800-813-HOPE (4673) ? [facility] Assists [place] cancer patients and their families through emotional  , educational and financial support.  336-427-4357 ? [place] DSS Where to apply for food stamps, Medicaid and utility assistance. 336-342-1394 ? RCATS: Transportation to medical appointments. 336-347-2287 ? Social Security Administration: May apply for disability if have a Stage IV cancer. 336-342-7796 1-800-772-1213 ? [place] Aging, Disability and Transit Services: Assists with nutrition, care and transit needs. 336-349-2343  Cancer Center Support Programs: @10RELATIVEDAYS@ > Cancer Support Group  2nd Tuesday of the month 1pm-2pm, Journey Room  > Creative Journey  3rd Tuesday of the month 1130am-1pm, Journey Room  > Look Good Feel Better  1st Wednesday of the month 10am-12 noon, Journey Room (Call American Cancer Society to register 1-800-395-5775)    

## 2016-10-14 ENCOUNTER — Other Ambulatory Visit: Payer: Self-pay | Admitting: "Endocrinology

## 2016-10-17 DIAGNOSIS — Z794 Long term (current) use of insulin: Secondary | ICD-10-CM | POA: Diagnosis not present

## 2016-10-17 DIAGNOSIS — E1122 Type 2 diabetes mellitus with diabetic chronic kidney disease: Secondary | ICD-10-CM | POA: Diagnosis not present

## 2016-10-17 DIAGNOSIS — N184 Chronic kidney disease, stage 4 (severe): Secondary | ICD-10-CM | POA: Diagnosis not present

## 2016-10-18 LAB — CMP14+EGFR
ALBUMIN: 3.5 g/dL (ref 3.5–4.8)
ALT: 15 IU/L (ref 0–32)
AST: 17 IU/L (ref 0–40)
Albumin/Globulin Ratio: 1.2 (ref 1.2–2.2)
Alkaline Phosphatase: 61 IU/L (ref 39–117)
BUN / CREAT RATIO: 26 (ref 12–28)
BUN: 67 mg/dL — AB (ref 8–27)
Bilirubin Total: 0.3 mg/dL (ref 0.0–1.2)
CO2: 26 mmol/L (ref 18–29)
Calcium: 8.4 mg/dL — ABNORMAL LOW (ref 8.7–10.3)
Chloride: 103 mmol/L (ref 96–106)
Creatinine, Ser: 2.56 mg/dL — ABNORMAL HIGH (ref 0.57–1.00)
GFR calc non Af Amer: 18 mL/min/{1.73_m2} — ABNORMAL LOW (ref >59)
GFR, EST AFRICAN AMERICAN: 21 mL/min/{1.73_m2} — AB (ref >59)
GLUCOSE: 70 mg/dL (ref 65–99)
Globulin, Total: 3 g/dL (ref 1.5–4.5)
Potassium: 4.2 mmol/L (ref 3.5–5.2)
Sodium: 145 mmol/L — ABNORMAL HIGH (ref 134–144)
Total Protein: 6.5 g/dL (ref 6.0–8.5)

## 2016-10-18 LAB — HEMOGLOBIN A1C
Est. average glucose Bld gHb Est-mCnc: 117 mg/dL
Hgb A1c MFr Bld: 5.7 % — ABNORMAL HIGH (ref 4.8–5.6)

## 2016-10-20 ENCOUNTER — Other Ambulatory Visit: Payer: Self-pay | Admitting: "Endocrinology

## 2016-10-21 DIAGNOSIS — M25511 Pain in right shoulder: Secondary | ICD-10-CM | POA: Diagnosis not present

## 2016-10-21 DIAGNOSIS — R350 Frequency of micturition: Secondary | ICD-10-CM | POA: Diagnosis not present

## 2016-10-21 DIAGNOSIS — Z1389 Encounter for screening for other disorder: Secondary | ICD-10-CM | POA: Diagnosis not present

## 2016-10-21 DIAGNOSIS — N342 Other urethritis: Secondary | ICD-10-CM | POA: Diagnosis not present

## 2016-10-21 DIAGNOSIS — R319 Hematuria, unspecified: Secondary | ICD-10-CM | POA: Diagnosis not present

## 2016-10-22 ENCOUNTER — Encounter (HOSPITAL_COMMUNITY): Payer: Medicare Other

## 2016-10-22 ENCOUNTER — Encounter (HOSPITAL_BASED_OUTPATIENT_CLINIC_OR_DEPARTMENT_OTHER): Payer: Medicare Other | Admitting: Oncology

## 2016-10-22 ENCOUNTER — Encounter (HOSPITAL_BASED_OUTPATIENT_CLINIC_OR_DEPARTMENT_OTHER): Payer: Medicare Other

## 2016-10-22 ENCOUNTER — Other Ambulatory Visit: Payer: Self-pay | Admitting: Cardiology

## 2016-10-22 VITALS — BP 161/58 | HR 87 | Temp 97.7°F | Resp 18 | Ht 65.0 in | Wt 213.7 lb

## 2016-10-22 DIAGNOSIS — N184 Chronic kidney disease, stage 4 (severe): Secondary | ICD-10-CM | POA: Diagnosis not present

## 2016-10-22 DIAGNOSIS — N189 Chronic kidney disease, unspecified: Secondary | ICD-10-CM

## 2016-10-22 DIAGNOSIS — N058 Unspecified nephritic syndrome with other morphologic changes: Secondary | ICD-10-CM

## 2016-10-22 DIAGNOSIS — D631 Anemia in chronic kidney disease: Secondary | ICD-10-CM

## 2016-10-22 DIAGNOSIS — R195 Other fecal abnormalities: Secondary | ICD-10-CM

## 2016-10-22 DIAGNOSIS — D509 Iron deficiency anemia, unspecified: Secondary | ICD-10-CM

## 2016-10-22 LAB — CBC WITH DIFFERENTIAL/PLATELET
BASOS PCT: 0 %
Basophils Absolute: 0 10*3/uL (ref 0.0–0.1)
EOS ABS: 0.1 10*3/uL (ref 0.0–0.7)
Eosinophils Relative: 1 %
HEMATOCRIT: 27.4 % — AB (ref 36.0–46.0)
HEMOGLOBIN: 8.6 g/dL — AB (ref 12.0–15.0)
Lymphocytes Relative: 8 %
Lymphs Abs: 0.6 10*3/uL — ABNORMAL LOW (ref 0.7–4.0)
MCH: 27 pg (ref 26.0–34.0)
MCHC: 31.4 g/dL (ref 30.0–36.0)
MCV: 85.9 fL (ref 78.0–100.0)
MONOS PCT: 7 %
Monocytes Absolute: 0.5 10*3/uL (ref 0.1–1.0)
NEUTROS PCT: 84 %
Neutro Abs: 5.9 10*3/uL (ref 1.7–7.7)
Platelets: 288 10*3/uL (ref 150–400)
RBC: 3.19 MIL/uL — AB (ref 3.87–5.11)
RDW: 16.3 % — ABNORMAL HIGH (ref 11.5–15.5)
WBC: 7 10*3/uL (ref 4.0–10.5)

## 2016-10-22 LAB — SAMPLE TO BLOOD BANK

## 2016-10-22 MED ORDER — DARBEPOETIN ALFA 150 MCG/0.3ML IJ SOSY
125.0000 ug | PREFILLED_SYRINGE | Freq: Once | INTRAMUSCULAR | Status: AC
  Administered 2016-10-22: 125 ug via SUBCUTANEOUS

## 2016-10-22 MED ORDER — DARBEPOETIN ALFA 150 MCG/0.3ML IJ SOSY
PREFILLED_SYRINGE | INTRAMUSCULAR | Status: AC
  Filled 2016-10-22: qty 0.3

## 2016-10-22 NOTE — Progress Notes (Signed)
[name] presents today for injection per MD orders. Aranesp 125 mcg administered SQ in right Upper Arm. Administration without incident. Patient tolerated well.

## 2016-10-22 NOTE — Assessment & Plan Note (Signed)
Chronic renal disease, Stage IV, in the setting of SLE (followed by Dr. 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 No bone marrow aspiration and biopsy documented.   Labs every 2 weeks: CBC   Labs in 4 weeks: BMET, and ferritin.  Continue Aranesp 125 mcg every 2 weeks.  Aranesp to be given today.  She sees Dr. Dossie Der, Rheumatology.  She is currently on Plaquenil and her Prednisone.    She had her colonoscopy on 07/24/2016 by Dr. [name].  I personally reviewed and went over pathology results with the patient.  Return in 12-16 weeks for follow-up.

## 2016-10-22 NOTE — Progress Notes (Signed)
[name], MD [place] [address]  Anemia of chronic renal failure, stage 4 (severe) ([place]) - Plan: CBC with Differential, Basic metabolic panel, Ferritin, Iron and TIBC  CURRENT THERAPY: Aranesp 125 mcg every 2 weeks. IV iron as needed to maintain a ferritin of 100.   INTERVAL HISTORY: [name] 71 y.o. female returns for followup of chronic renal disease, Stage IV, in the setting of SLE (followed by Dr. 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 No bone marrow aspiration and biopsy documented.  AND A-flutter, on Coumadin  She is doing well.  She denies any blood loss.  She denies any blood in her stools or dark stools.  She underwent her colonoscopy in October 2017 and this was negative.  Polypectomy was negative for any dysplasia/malignancy.  She is not on hemodialysis.  She continues to have financial burden with ongoing ESA therapy.  There is no assistance available for this medication (this was researched).  Review of Systems  Constitutional: Positive for malaise/fatigue. Negative for chills, fever and weight loss.  HENT: Negative.   Eyes: Negative.   Respiratory: Negative.  Negative for cough.   Cardiovascular: Negative.  Negative for chest pain.  Gastrointestinal: Negative.  Negative for blood in stool, constipation, diarrhea, melena, nausea and vomiting.  Genitourinary: Negative.   Musculoskeletal: Negative.   Skin: Negative.   Neurological: Positive for weakness.  Endo/Heme/Allergies: Negative.   Psychiatric/Behavioral: Negative.     Past Medical History:  Diagnosis Date  . Anemia   . Asthma   . Atrial flutter [facility])    TEE/DCCV December 2013 - on Xarelto [facility])  . Bone spur of toe    Right 5th toe  . CKD (chronic kidney disease) stage 3, GFR 30-59 ml/min   . DVT of upper extremity  (deep vein thrombosis) ([place])    Right basilic vein, June 3419  . Essential hypertension, benign   . SLE (systemic lupus erythematosus) ([place])   . Type 2 diabetes mellitus ([place])   . UTI (lower urinary tract infection)     Past Surgical History:  Procedure Laterality Date  . ABDOMINAL HYSTERECTOMY  1983  . ANKLE SURGERY  1993  . AV FISTULA PLACEMENT Left 03/27/2014   Procedure: ARTERIOVENOUS (AV) FISTULA CREATION;  Surgeon: Rosetta Posner, MD;  Location: [facility];  Service: Vascular;  Laterality: Left;  . BACK SURGERY  1980  . CARDIOVASCULAR STRESS TEST  12/19/2009   no stress induced rhythm abnormalities, ekg negative for ischemia  . CARDIOVERSION  09/16/2012   Procedure: CARDIOVERSION;  Surgeon: Sanda Klein, MD;  Location: [facility] ENDOSCOPY;  Service: Cardiovascular;  Laterality: N/A;  . COLONOSCOPY  09/20/2012   Dr. [name]: multiple tubular adenomas   . COLONOSCOPY  2005   Dr. [name]. Polyps, path unknown   . COLONOSCOPY N/A 07/24/2016   Procedure: COLONOSCOPY;  Surgeon: [name], MD;  [facility];  Service: Endoscopy;  Laterality: N/A;  9:00 am  . ESOPHAGOGASTRODUODENOSCOPY  09/20/2012   Dr. [name]: duodenal erosion and possible resolving ulcer at the angularis   . TEE WITHOUT CARDIOVERSION  09/16/2012   Procedure: TRANSESOPHAGEAL ECHOCARDIOGRAM (TEE);  Surgeon: Sanda Klein, MD;  [facility]COPY;  Service: Cardiovascular;  Laterality: N/A;  . TRANSESOPHAGEAL ECHOCARDIOGRAM WITH CARDIOVERSION  09/16/2012   EF 60-65%, moderate LVH, moderate regurg of the aortic valve, LA moderately dilated  Family History  Problem Relation Age of Onset  . Diabetes Brother   . Diabetes Father   . Hypertension Father   . Hypertension Sister   . Stroke Mother   . Diabetes Sister   . Hypertension Brother   . Colon cancer Neg Hx     Social History   Social History  . Marital status: Married    Spouse name: N/A  . Number of children: N/A  . Years of education: N/A    Social History Main Topics  . Smoking status: Former Smoker    Packs/day: 1.00    Years: 30.00    Types: Cigarettes    Quit date: 08/29/2012  . Smokeless tobacco: Never Used     Comment: quit 08-2012  . Alcohol use No  . Drug use: No  . Sexual activity: Yes    Partners: Male   Other Topics Concern  . Not on file   Social History Narrative  . No narrative on file     PHYSICAL EXAMINATION  ECOG PERFORMANCE STATUS: 1 - Symptomatic but completely ambulatory  There were no vitals filed for this visit.  BP 161/58 P87 R 18 T 97.9  GENERAL:alert, no distress, well nourished, well developed, comfortable, cooperative, smiling and in wheelchair, unaccompanied SKIN: skin color, texture, turgor are normal, no rashes or significant lesions HEAD: Normocephalic, No masses, lesions, tenderness or abnormalities EYES: normal, EOMI, Conjunctiva are pink and non-injected EARS: External ears normal OROPHARYNX:lips, buccal mucosa, and tongue normal and mucous membranes are moist  NECK: supple, trachea midline LYMPH:  no palpable lymphadenopathy BREAST:not examined LUNGS: clear to auscultation  HEART: regular rate & rhythm ABDOMEN:abdomen soft, obese and normal bowel sounds BACK: Back symmetric, no curvature. EXTREMITIES:less then 2 second capillary refill, no joint deformities, effusion, or inflammation, no skin discoloration, no cyanosis  NEURO: alert & oriented x 3 with fluent speech   LABORATORY DATA: CBC    Component Value Date/Time   WBC 7.0 10/22/2016 1349   RBC 3.19 (L) 10/22/2016 1349   HGB 8.6 (L) 10/22/2016 1349   HCT 27.4 (L) 10/22/2016 1349   PLT 288 10/22/2016 1349   MCV 85.9 10/22/2016 1349   MCH 27.0 10/22/2016 1349   MCHC 31.4 10/22/2016 1349   RDW 16.3 (H) 10/22/2016 1349   LYMPHSABS 0.6 (L) 10/22/2016 1349   MONOABS 0.5 10/22/2016 1349   EOSABS 0.1 10/22/2016 1349   BASOSABS 0.0 10/22/2016 1349      Chemistry      Component Value Date/Time   NA 145  (H) 10/17/2016 1133   K 4.2 10/17/2016 1133   CL 103 10/17/2016 1133   CO2 26 10/17/2016 1133   BUN 67 (H) 10/17/2016 1133   CREATININE 2.56 (H) 10/17/2016 1133      Component Value Date/Time   CALCIUM 8.4 (L) 10/17/2016 1133   CALCIUM 7.0 (L) 03/16/2014 0835   ALKPHOS 61 10/17/2016 1133   AST 17 10/17/2016 1133   ALT 15 10/17/2016 1133   BILITOT 0.3 10/17/2016 1133        PENDING LABS:   RADIOGRAPHIC STUDIES:  Mm Screening Breast Tomo Bilateral  Result Date: 09/26/2016 CLINICAL DATA:  Screening. EXAM: 2D DIGITAL SCREENING BILATERAL MAMMOGRAM WITH CAD AND ADJUNCT TOMO COMPARISON:  Previous exam(s). ACR Breast Density Category c: The breast tissue is heterogeneously dense, which may obscure small masses. FINDINGS: There are no findings suspicious for malignancy. Images were processed with CAD. IMPRESSION: No mammographic evidence of malignancy. A result letter of this screening mammogram  will be mailed directly to the patient. RECOMMENDATION: Screening mammogram in one year. (Code:SM-B-01Y) BI-RADS CATEGORY  1: Negative. Electronically Signed   By: Franki Cabot M.D.   On: 09/26/2016 08:29     PATHOLOGY:    ASSESSMENT AND PLAN:  Anemia of chronic renal failure, stage 4 (severe) (H[facility]) Chronic renal disease, Stage IV, in the setting of SLE (followed by Dr. 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 No bone marrow aspiration and biopsy documented.   Labs every 2 weeks: CBC   Labs in 4 weeks: BMET, and ferritin.  Continue Aranesp 125 mcg every 2 weeks.  Aranesp to be given today.  She sees Dr. Dossie Der, Rheumatology.  She is currently on Plaquenil and her Prednisone.    She had her colonoscopy on 07/24/2016 by Dr. [name].  I personally reviewed and went over pathology results with the patient.  Return in 12-16 weeks for follow-up.   ORDERS PLACED  FOR THIS ENCOUNTER: Orders Placed This Encounter  Procedures  . CBC with Differential  . Basic metabolic panel  . Ferritin  . Iron and TIBC    MEDICATIONS PRESCRIBED THIS ENCOUNTER: No orders of the defined types were placed in this encounter.   THERAPY PLAN:  Continue with Aranesp every 2 weeks.  All questions were answered. The patient knows to call the clinic with any problems, questions or concerns. We can certainly see the patient much sooner if necessary.  Patient and plan discussed with Dr. Ancil Linsey and she is in agreement with the aforementioned.   This note is electronically signed by: Doy Mince 10/22/2016 3:06 PM

## 2016-10-22 NOTE — Patient Instructions (Signed)
[place] at [facility] Discharge Instructions  RECOMMENDATIONS MADE BY THE CONSULTANT AND ANY TEST RESULTS WILL BE SENT TO YOUR REFERRING PHYSICIAN.  You were seen today by Kirby Crigler PA-C.  Aranesp given today. Return every 2 weeks for labs and Aranesp. REturn in 12-16 weeks for follow up.   Thank you for choosing [place] at [facility] to provide your oncology and hematology care.  To afford each patient quality time with our provider, please arrive at least 15 minutes before your scheduled appointment time.    If you have a lab appointment with the [facility] please come in thru the  Main Entrance and check in at the main information desk  You need to re-schedule your appointment should you arrive 10 or more minutes late.  We strive to give you quality time with our providers, and arriving late affects you and other patients whose appointments are after yours.  Also, if you no show three or more times for appointments you may be dismissed from the clinic at the providers discretion.     Again, thank you for choosing [facility].  Our hope is that these requests will decrease the amount of time that you wait before being seen by our physicians.       _____________________________________________________________  Should you have questions after your visit to [facility], please contact our office at [phone] between the hours of 8:30 a.m. and 4:30 p.m.  Voicemails left after 4:30 p.m. will not be returned until the following business day.  For prescription refill requests, have your pharmacy contact our office.       Resources For Cancer Patients and their Caregivers ? American Cancer Society: Can assist with transportation, wigs, general needs, runs Look Good Feel Better.        678 404 3035 ? Cancer Care: Provides financial assistance, online support groups, medication/co-pay assistance.   1-800-813-HOPE (586)582-4814) ? [facility] Assists [place] cancer patients and their families through emotional , educational and financial support.  561 738 1915 ? [place] DSS Where to apply for food stamps, Medicaid and utility assistance. (667)631-0487 ? RCATS: Transportation to medical appointments. 281-012-5621 ? Social Security Administration: May apply for disability if have a Stage IV cancer. 440-562-2820 (772) 805-7084 ? [employer], Disability and Transit Services: Assists with nutrition, care and transit needs. [place] Support Programs: @10RELATIVEDAYS @ > Cancer Support Group  2nd Tuesday of the month 1pm-2pm, Journey Room  > Creative Journey  3rd Tuesday of the month 1130am-1pm, Journey Room  > Look Good Feel Better  1st Wednesday of the month 10am-12 noon, Journey Room (Call [place] to register [phone])

## 2016-10-23 ENCOUNTER — Ambulatory Visit (INDEPENDENT_AMBULATORY_CARE_PROVIDER_SITE_OTHER): Payer: Medicare Other | Admitting: "Endocrinology

## 2016-10-23 ENCOUNTER — Encounter: Payer: Self-pay | Admitting: "Endocrinology

## 2016-10-23 VITALS — BP 148/64 | HR 74 | Ht 65.5 in | Wt 212.0 lb

## 2016-10-23 DIAGNOSIS — E782 Mixed hyperlipidemia: Secondary | ICD-10-CM

## 2016-10-23 DIAGNOSIS — Z794 Long term (current) use of insulin: Secondary | ICD-10-CM | POA: Diagnosis not present

## 2016-10-23 DIAGNOSIS — I10 Essential (primary) hypertension: Secondary | ICD-10-CM | POA: Diagnosis not present

## 2016-10-23 DIAGNOSIS — N184 Chronic kidney disease, stage 4 (severe): Secondary | ICD-10-CM | POA: Diagnosis not present

## 2016-10-23 DIAGNOSIS — E1122 Type 2 diabetes mellitus with diabetic chronic kidney disease: Secondary | ICD-10-CM | POA: Diagnosis not present

## 2016-10-23 NOTE — Progress Notes (Signed)
Subjective:    Patient ID: [name], female    DOB: 09/03/1946,    Past Medical History:  Diagnosis Date  . Anemia   . Asthma   . Atrial flutter [facility])    TEE/DCCV December 2013 - on Xarelto [facility])  . Bone spur of toe    Right 5th toe  . CKD (chronic Little disease) stage 3, GFR 30-59 ml/min   . DVT of upper extremity (deep vein thrombosis) ([place])    Right basilic vein, June 7262  . Essential hypertension, benign   . SLE (systemic lupus erythematosus) ([place])   . Type 2 diabetes mellitus ([place])   . UTI (lower urinary tract infection)    Past Surgical History:  Procedure Laterality Date  . ABDOMINAL HYSTERECTOMY  1983  . ANKLE SURGERY  1993  . AV FISTULA PLACEMENT Left 03/27/2014   Procedure: ARTERIOVENOUS (AV) FISTULA CREATION;  Surgeon: Rosetta Posner, MD;  Location: [place];  Service: Vascular;  Laterality: Left;  . BACK SURGERY  1980  . CARDIOVASCULAR STRESS TEST  12/19/2009   no stress induced rhythm abnormalities, ekg negative for ischemia  . CARDIOVERSION  09/16/2012   Procedure: CARDIOVERSION;  Surgeon: Sanda Klein, MD;  Location: [facility] ENDOSCOPY;  Service: Cardiovascular;  Laterality: N/A;  . COLONOSCOPY  09/20/2012   Dr. [name]: multiple tubular adenomas   . COLONOSCOPY  2005   Dr. [name]. Polyps, path unknown   . COLONOSCOPY N/A 07/24/2016   Procedure: COLONOSCOPY;  Surgeon: [name], MD;  [facility];  Service: Endoscopy;  Laterality: N/A;  9:00 am  . ESOPHAGOGASTRODUODENOSCOPY  09/20/2012   Dr. [name]: duodenal erosion and possible resolving ulcer at the angularis   . TEE WITHOUT CARDIOVERSION  09/16/2012   Procedure: TRANSESOPHAGEAL ECHOCARDIOGRAM (TEE);  Surgeon: Sanda Klein, MD;  Location: [facility]COPY;  Service: Cardiovascular;  Laterality: N/A;  . TRANSESOPHAGEAL ECHOCARDIOGRAM WITH CARDIOVERSION  09/16/2012   EF 60-65%, moderate LVH, moderate regurg of the aortic valve, LA moderately dilated   Social History   Social History   . Marital status: Married    Spouse name: N/A  . Number of children: N/A  . Years of education: N/A   Social History Main Topics  . Smoking status: Former Smoker    Packs/day: 1.00    Years: 30.00    Types: Cigarettes    Quit date: 08/29/2012  . Smokeless tobacco: Never Used     Comment: quit 08-2012  . Alcohol use No  . Drug use: No  . Sexual activity: Yes    Partners: Male   Other Topics Concern  . None   Social History Narrative  . None   Outpatient Encounter Prescriptions as of 10/23/2016  Medication Sig  . nitrofurantoin (MACRODANTIN) 100 MG capsule Take 100 mg by mouth 2 (two) times daily.  Marland Kitchen acetaminophen (TYLENOL) 500 MG tablet Take 1,000 mg by mouth every 6 (six) hours as needed. For pain  . albuterol (ACCUNEB) 0.63 MG/3ML nebulizer solution Take 1 ampule by nebulization every 6 (six) hours as needed for wheezing or shortness of breath.   Marland Kitchen albuterol (PROVENTIL HFA;VENTOLIN HFA) 108 (90 BASE) MCG/ACT inhaler Inhale 2 puffs into the lungs every 6 (six) hours as needed. For shortness of breath  . amLODipine (NORVASC) 10 MG tablet Take 1 tablet (10 mg total) by mouth at bedtime.  Marland Kitchen atorvastatin (LIPITOR) 40 MG tablet Take 20 mg by mouth at bedtime.   . brimonidine-timolol (COMBIGAN) 0.2-0.5 % ophthalmic solution Place  1 drop into the left eye daily.   . calcitRIOL (ROCALTROL) 0.25 MCG capsule Take 0.25 mcg by mouth as directed. Take on Mondays Wednesdays and Fridays  . diclofenac (VOLTAREN) 0.1 % ophthalmic solution Place 1 drop into the left eye daily. Uses at lunch time  . febuxostat (ULORIC) 40 MG tablet Take 40 mg by mouth daily.  . folic acid (FOLVITE) 1 MG tablet Take 1 mg by mouth daily.  . furosemide (LASIX) 80 MG tablet Take 160 mg by mouth 2 (two) times daily.   Marland Kitchen HUMALOG KWIKPEN 100 UNIT/ML KiwkPen INJECT UP TO 11 UNITS SUBCUTANEOUSLY BEFORE MEALS THREE TIMES DAILY.  Marland Kitchen HYDROcodone-acetaminophen (NORCO/VICODIN) 5-325 MG per tablet Take 1-2 tablets by mouth every  4 (four) hours as needed for moderate pain.  . hydroxychloroquine (PLAQUENIL) 200 MG tablet Take 200 mg by mouth as directed. Take 1 1/2 tablets daily  . insulin aspart (NOVOLOG) 100 UNIT/ML injection Inject 5-11 Units into the skin 3 (three) times daily with meals.  . Insulin Detemir (LEVEMIR) 100 UNIT/ML Pen Inject 12 Units into the skin at bedtime.  . metoprolol (LOPRESSOR) 50 MG tablet TAKE ONE TABLET BY MOUTH TWICE DAILY.  Marland Kitchen Misc Natural Products (TART CHERRY ADVANCED) CAPS Take 1 capsule by mouth 2 (two) times daily.  . Multiple Vitamins-Minerals (CENTRUM SILVER PO) Take 1 tablet by mouth daily.  . ONE TOUCH ULTRA TEST test strip TEST 4 TIMES A DAY AS DIRECTED.  Marland Kitchen ONETOUCH DELICA LANCETS 91Y MISC USE TO TEST 4 TIMES DAILY.  Marland Kitchen polyethylene glycol-electrolytes (TRILYTE) 420 g solution Take 4,000 mLs by mouth as directed.  . predniSONE (DELTASONE) 5 MG tablet Take 5 mg by mouth daily with breakfast.  . travoprost, benzalkonium, (TRAVATAN) 0.004 % ophthalmic solution Place 1 drop into the left eye at bedtime.  Marland Kitchen UNIFINE PENTIPS 31G X 6 MM MISC USE AS DIRECTED AT BEDTIME WITH LEVEMIR AND WITH SLIDING SCALE INSULIN.  Marland Kitchen Vitamin D, Ergocalciferol, (DRISDOL) 50000 units CAPS capsule TAKE 1 CAPSULE BY MOUTH ONCE WEEKLY.  . warfarin (COUMADIN) 5 MG tablet Take 1 tablet daily except 1 1/2 tablets on Mondays, Wednesdays and Fridays   No facility-administered encounter medications on file as of 10/23/2016.    ALLERGIES: Allergies  Allergen Reactions  . Ace Inhibitors Cough   VACCINATION STATUS: Immunization History  Administered Date(s) Administered  . Influenza,inj,Quad PF,36+ Mos 07/03/2014  . Influenza-Unspecified 07/11/2015, 06/27/2016    Diabetes  She presents for her follow-up diabetic visit. She has type 2 diabetes mellitus. Onset time: diagnosed at approx age of 36. Her disease course has been stable. Pertinent negatives for hypoglycemia include no confusion, headaches, pallor or  seizures. Associated symptoms include fatigue. Pertinent negatives for diabetes include no chest pain, no polydipsia, no polyphagia and no polyuria. Symptoms are stable. Diabetic complications include nephropathy. Risk factors for coronary artery disease include dyslipidemia, diabetes mellitus and obesity. She is compliant with treatment most of the time. Her weight is stable. She is following a generally unhealthy diet. She has had a previous visit with a dietitian. She never participates in exercise. Her home blood glucose trend is decreasing steadily (Her A1c has improved to 6%, no hypoglycemia. Her EAG is between 100- 150.). Her breakfast blood glucose range is generally 140-180 mg/dl. Her lunch blood glucose range is generally 140-180 mg/dl. Her dinner blood glucose range is generally 140-180 mg/dl. Her overall blood glucose range is 140-180 mg/dl. An ACE inhibitor/angiotensin II receptor blocker is being taken.  Hypertension  Pertinent negatives include  no chest pain, headaches, palpitations or shortness of breath.  Hyperlipidemia  Pertinent negatives include no chest pain, myalgias or shortness of breath. Current antihyperlipidemic treatment includes statins.     Review of Systems  Constitutional: Positive for fatigue. Negative for unexpected weight change.  HENT: Negative for trouble swallowing and voice change.   Eyes: Negative for visual disturbance.  Respiratory: Negative for cough, shortness of breath and wheezing.   Cardiovascular: Negative for chest pain, palpitations and leg swelling.  Gastrointestinal: Negative for diarrhea, nausea and vomiting.  Endocrine: Negative for cold intolerance, heat intolerance, polydipsia, polyphagia and polyuria.  Musculoskeletal: Negative for arthralgias and myalgias.  Skin: Negative for color change, pallor, rash and wound.  Neurological: Negative for seizures and headaches.  Psychiatric/Behavioral: Negative for confusion and suicidal ideas.     Objective:    BP (!) 148/64   Pulse 74   Ht 5' 5.5" (1.664 m)   Wt 212 lb (96.2 kg)   BMI 34.74 kg/m   Wt Readings from Last 3 Encounters:  10/23/16 212 lb (96.2 kg)  10/22/16 213 lb 11.2 oz (96.9 kg)  07/25/16 216 lb 3.2 oz (98.1 kg)    Physical Exam  Constitutional: She is oriented to person, place, and time. She appears well-developed.  HENT:  Head: Normocephalic and atraumatic.  Eyes: EOM are normal.  Neck: Normal range of motion. Neck supple. No tracheal deviation present. No thyromegaly present.  Cardiovascular: Normal rate and regular rhythm.   Pulmonary/Chest: Effort normal and breath sounds normal.  Abdominal: Soft. Bowel sounds are normal. There is no tenderness. There is no guarding.  Musculoskeletal: Normal range of motion. She exhibits no edema.  Neurological: She is alert and oriented to person, place, and time. She has normal reflexes. No cranial nerve deficit. Coordination normal.  Skin: Skin is warm and dry. No rash noted. No erythema. No pallor.  Psychiatric: She has a normal mood and affect. Judgment normal.    Chemistry (most recent): Lab Results  Component Value Date   NA 145 (H) 10/17/2016   K 4.2 10/17/2016   CL 103 10/17/2016   CO2 26 10/17/2016   BUN 67 (H) 10/17/2016   CREATININE 2.56 (H) 10/17/2016   Diabetic Labs (most recent): Lab Results  Component Value Date   HGBA1C 5.7 (H) 10/17/2016   HGBA1C 5.7 (H) 07/10/2016   HGBA1C 5.9 (H) 04/02/2016     Assessment & Plan:   1. Type 2 diabetes mellitus with stage 4 chronic Little disease Patient is advised to stick to a routine mealtimes to eat 3 meals  a day and avoid unnecessary snacks ( to snack only to correct hypoglycemia). -Her A1c is 5.7% in disagreement with her estimated average blood glucose of 100-150 likely still  due to mild anemia with hemoglobin of 8.6, Getting iron infusion at [facility]. Patient is advised to eliminate simple carbs  from their diet including cakes  desserts ice cream soda (  diet and regular) , sweet tea , Candies,  chips and cookies, artificial sweeteners,   and "sugar-free" products .  This will help patient to have stable blood glucose profile and potentially lose weight. Patient is given detailed personalized glucose monitoring and insulin dosing instructions. Patient is instructed to call back with extremes of blood glucose less than 70 or greater than 300. Patient to bring meter and  blood glucose logs during their next visit.  I will continue Levemir   12 units qhs, continue novolog 5 units TIDAC plus correction. -She has  a chance to come off of NovoLog only if she is willing to avoid concentrated carbohydrates such as soda. - She is advised to hold off insulin treatment during her preparation for colonoscopy with adequate diet. She will call clinic if she registers greater than 300 mg/dL.  2. Essential hypertension, benign Controlled. Continue ARB.  3. Hyperlipemia Continue atorvastatatin.   4. Morbid obesity Carbs advise given. See above #1. 5. vitamin D deficiency.  -Continue current Vitamin D supplement.   Follow up plan: Return in about 3 months (around 01/21/2017) for meter, and logs.  Glade Lloyd, MD Phone: [phone]  Fax: [phone]   10/23/2016, 11:34 AM

## 2016-10-23 NOTE — Patient Instructions (Signed)

## 2016-10-27 ENCOUNTER — Ambulatory Visit (INDEPENDENT_AMBULATORY_CARE_PROVIDER_SITE_OTHER): Payer: Medicare Other | Admitting: *Deleted

## 2016-10-27 DIAGNOSIS — Z5181 Encounter for therapeutic drug level monitoring: Secondary | ICD-10-CM | POA: Diagnosis not present

## 2016-10-27 DIAGNOSIS — I4892 Unspecified atrial flutter: Secondary | ICD-10-CM

## 2016-10-27 LAB — POCT INR: INR: 6

## 2016-10-28 DIAGNOSIS — E1140 Type 2 diabetes mellitus with diabetic neuropathy, unspecified: Secondary | ICD-10-CM | POA: Diagnosis not present

## 2016-10-28 DIAGNOSIS — E1151 Type 2 diabetes mellitus with diabetic peripheral angiopathy without gangrene: Secondary | ICD-10-CM | POA: Diagnosis not present

## 2016-10-28 DIAGNOSIS — L89892 Pressure ulcer of other site, stage 2: Secondary | ICD-10-CM | POA: Diagnosis not present

## 2016-10-30 ENCOUNTER — Other Ambulatory Visit (HOSPITAL_COMMUNITY): Payer: Self-pay | Admitting: Hematology & Oncology

## 2016-11-03 ENCOUNTER — Ambulatory Visit (INDEPENDENT_AMBULATORY_CARE_PROVIDER_SITE_OTHER): Payer: Medicare Other | Admitting: *Deleted

## 2016-11-03 DIAGNOSIS — M810 Age-related osteoporosis without current pathological fracture: Secondary | ICD-10-CM | POA: Diagnosis not present

## 2016-11-03 DIAGNOSIS — M150 Primary generalized (osteo)arthritis: Secondary | ICD-10-CM | POA: Diagnosis not present

## 2016-11-03 DIAGNOSIS — I4892 Unspecified atrial flutter: Secondary | ICD-10-CM

## 2016-11-03 DIAGNOSIS — M329 Systemic lupus erythematosus, unspecified: Secondary | ICD-10-CM | POA: Diagnosis not present

## 2016-11-03 DIAGNOSIS — N189 Chronic kidney disease, unspecified: Secondary | ICD-10-CM | POA: Diagnosis not present

## 2016-11-03 DIAGNOSIS — Z5181 Encounter for therapeutic drug level monitoring: Secondary | ICD-10-CM | POA: Diagnosis not present

## 2016-11-03 LAB — POCT INR: INR: 1.5

## 2016-11-04 DIAGNOSIS — R319 Hematuria, unspecified: Secondary | ICD-10-CM | POA: Diagnosis not present

## 2016-11-05 ENCOUNTER — Encounter (HOSPITAL_COMMUNITY): Payer: Medicare Other | Attending: Adult Health

## 2016-11-05 ENCOUNTER — Encounter (HOSPITAL_COMMUNITY): Payer: Medicare Other | Attending: Oncology

## 2016-11-05 VITALS — BP 118/77 | HR 74 | Temp 97.8°F | Resp 18

## 2016-11-05 DIAGNOSIS — N184 Chronic kidney disease, stage 4 (severe): Secondary | ICD-10-CM | POA: Diagnosis not present

## 2016-11-05 DIAGNOSIS — N058 Unspecified nephritic syndrome with other morphologic changes: Secondary | ICD-10-CM

## 2016-11-05 DIAGNOSIS — N189 Chronic kidney disease, unspecified: Secondary | ICD-10-CM

## 2016-11-05 DIAGNOSIS — D631 Anemia in chronic kidney disease: Secondary | ICD-10-CM | POA: Diagnosis not present

## 2016-11-05 LAB — CBC WITH DIFFERENTIAL/PLATELET
BASOS ABS: 0 10*3/uL (ref 0.0–0.1)
BASOS PCT: 0 %
Eosinophils Absolute: 0.1 10*3/uL (ref 0.0–0.7)
Eosinophils Relative: 1 %
HCT: 28.1 % — ABNORMAL LOW (ref 36.0–46.0)
HEMOGLOBIN: 8.6 g/dL — AB (ref 12.0–15.0)
Lymphocytes Relative: 7 %
Lymphs Abs: 0.6 10*3/uL — ABNORMAL LOW (ref 0.7–4.0)
MCH: 26.8 pg (ref 26.0–34.0)
MCHC: 30.6 g/dL (ref 30.0–36.0)
MCV: 87.5 fL (ref 78.0–100.0)
Monocytes Absolute: 0.5 10*3/uL (ref 0.1–1.0)
Monocytes Relative: 5 %
NEUTROS ABS: 7.6 10*3/uL (ref 1.7–7.7)
NEUTROS PCT: 87 %
Platelets: 217 10*3/uL (ref 150–400)
RBC: 3.21 MIL/uL — AB (ref 3.87–5.11)
RDW: 17.8 % — AB (ref 11.5–15.5)
WBC: 8.7 10*3/uL (ref 4.0–10.5)

## 2016-11-05 MED ORDER — DARBEPOETIN ALFA 150 MCG/0.3ML IJ SOSY
PREFILLED_SYRINGE | INTRAMUSCULAR | Status: AC
  Filled 2016-11-05: qty 0.3

## 2016-11-05 MED ORDER — DARBEPOETIN ALFA 150 MCG/0.3ML IJ SOSY
125.0000 ug | PREFILLED_SYRINGE | Freq: Once | INTRAMUSCULAR | Status: AC
  Administered 2016-11-05: 125 ug via SUBCUTANEOUS

## 2016-11-05 NOTE — Progress Notes (Signed)
[name] tolerated Aranesp injection well without complaints or incident. Hgb 8.6 Aranesp 125 mcg given SQ in right arm. VSS Pt discharged via wheelchair in satisfactory condition accompanied by her husband

## 2016-11-05 NOTE — Patient Instructions (Signed)
[facility] at [facility] Discharge Instructions  RECOMMENDATIONS MADE BY THE CONSULTANT AND ANY TEST RESULTS WILL BE SENT TO YOUR REFERRING PHYSICIAN.  Received Aranesp injection today. Follow-up as scheduled. Call clinic for any questions or concerns  Thank you for choosing [facility] at [facility] to provide your oncology and hematology care.  To afford each patient quality time with our provider, please arrive at least 15 minutes before your scheduled appointment time.    If you have a lab appointment with the Cancer Center please come in thru the  Main Entrance and check in at the main information desk  You need to re-schedule your appointment should you arrive 10 or more minutes late.  We strive to give you quality time with our providers, and arriving late affects you and other patients whose appointments are after yours.  Also, if you no show three or more times for appointments you may be dismissed from the clinic at the providers discretion.     Again, thank you for choosing [facility].  Our hope is that these requests will decrease the amount of time that you wait before being seen by our physicians.       _____________________________________________________________  Should you have questions after your visit to [facility], please contact our office at [phone] between the hours of 8:30 a.m. and 4:30 p.m.  Voicemails left after 4:30 p.m. will not be returned until the following business day.  For prescription refill requests, have your pharmacy contact our office.       Resources For Cancer Patients and their Caregivers ? American Cancer Society: Can assist with transportation, wigs, general needs, runs Look Good Feel Better.        1-888-227-6333 ? Cancer Care: Provides financial assistance, online support groups, medication/co-pay assistance.  1-800-813-HOPE (4673) ? [facility] Assists [place] cancer patients and their families through emotional , educational and financial support.  336-427-4357 ? [place] DSS Where to apply for food stamps, Medicaid and utility assistance. 336-342-1394 ? RCATS: Transportation to medical appointments. 336-347-2287 ? Social Security Administration: May apply for disability if have a Stage IV cancer. 336-342-7796 1-800-772-1213 ? [place] Aging, Disability and Transit Services: Assists with nutrition, care and transit needs. 336-349-2343  Cancer Center Support Programs: @10RELATIVEDAYS@ > Cancer Support Group  2nd Tuesday of the month 1pm-2pm, Journey Room  > Creative Journey  3rd Tuesday of the month 1130am-1pm, Journey Room  > Look Good Feel Better  1st Wednesday of the month 10am-12 noon, Journey Room (Call [employer] to register 1-800-395-5775)   

## 2016-11-10 ENCOUNTER — Encounter (HOSPITAL_BASED_OUTPATIENT_CLINIC_OR_DEPARTMENT_OTHER): Payer: Medicare Other

## 2016-11-10 VITALS — BP 161/48 | HR 70 | Temp 97.7°F | Resp 18

## 2016-11-10 DIAGNOSIS — N184 Chronic kidney disease, stage 4 (severe): Secondary | ICD-10-CM

## 2016-11-10 DIAGNOSIS — D631 Anemia in chronic kidney disease: Secondary | ICD-10-CM | POA: Diagnosis not present

## 2016-11-10 DIAGNOSIS — N058 Unspecified nephritic syndrome with other morphologic changes: Secondary | ICD-10-CM

## 2016-11-10 MED ORDER — SODIUM CHLORIDE 0.9 % IV SOLN
125.0000 mg | Freq: Once | INTRAVENOUS | Status: AC
  Administered 2016-11-10: 125 mg via INTRAVENOUS
  Filled 2016-11-10: qty 10

## 2016-11-10 NOTE — Progress Notes (Signed)
Tolerated iron infusion well. Stable on discharge home with husband via wheelchair.

## 2016-11-10 NOTE — Patient Instructions (Signed)
[place] at [facility] Discharge Instructions  RECOMMENDATIONS MADE BY THE CONSULTANT AND ANY TEST RESULTS WILL BE SENT TO YOUR REFERRING PHYSICIAN.  Ferric gluconate 125 mg iron infusion given as ordered. Return as scheduled.  Thank you for choosing [facility] to provide your oncology and hematology care.  To afford each patient quality time with our provider, please arrive at least 15 minutes before your scheduled appointment time.    If you have a lab appointment with the [place] please come in thru the  Main Entrance and check in at the main information desk  You need to re-schedule your appointment should you arrive 10 or more minutes late.  We strive to give you quality time with our providers, and arriving late affects you and other patients whose appointments are after yours.  Also, if you no show three or more times for appointments you may be dismissed from the clinic at the providers discretion.     Again, thank you for choosing [facility]  Our hope is that these requests will decrease the amount of time that you wait before being seen by our physicians.       _____________________________________________________________  Should you have questions after your visit to [facility], please contact our office at [phone] between the hours of 8:30 a.m. and 4:30 p.m.  Voicemails left after 4:30 p.m. will not be returned until the following business day.  For prescription refill requests, have your pharmacy contact our office.       Resources For Cancer Patients and their Caregivers ? American Cancer Society: Can assist with transportation, wigs, general needs, runs Look Good Feel Better.        608-184-9476 ? Cancer Care: Provides financial assistance, online support groups, medication/co-pay assistance.  1-800-813-HOPE 715-610-9924) ? [facility] Assists  [place] cancer patients and their families through emotional , educational and financial support.  (504)498-1716 ? [place] DSS Where to apply for food stamps, Medicaid and utility assistance. 845-150-2914 ? RCATS: Transportation to medical appointments. 802-360-6613 ? Social Security Administration: May apply for disability if have a Stage IV cancer. 3196394303 424-399-3645 ? [employer], Disability and Transit Services: Assists with nutrition, care and transit needs. [place] Support Programs: @10RELATIVEDAYS @ > Cancer Support Group  2nd Tuesday of the month 1pm-2pm, Journey Room  > Creative Journey  3rd Tuesday of the month 1130am-1pm, Journey Room  > Look Good Feel Better  1st Wednesday of the month 10am-12 noon, Journey Room (Call [place] to register [phone])

## 2016-11-14 DIAGNOSIS — D631 Anemia in chronic kidney disease: Secondary | ICD-10-CM | POA: Diagnosis not present

## 2016-11-14 DIAGNOSIS — N179 Acute kidney failure, unspecified: Secondary | ICD-10-CM | POA: Diagnosis not present

## 2016-11-14 DIAGNOSIS — M329 Systemic lupus erythematosus, unspecified: Secondary | ICD-10-CM | POA: Diagnosis not present

## 2016-11-14 DIAGNOSIS — N184 Chronic kidney disease, stage 4 (severe): Secondary | ICD-10-CM | POA: Diagnosis not present

## 2016-11-14 DIAGNOSIS — N2581 Secondary hyperparathyroidism of renal origin: Secondary | ICD-10-CM | POA: Diagnosis not present

## 2016-11-19 ENCOUNTER — Encounter (HOSPITAL_COMMUNITY): Payer: Medicare Other | Attending: Oncology

## 2016-11-19 ENCOUNTER — Encounter (HOSPITAL_COMMUNITY): Payer: Medicare Other

## 2016-11-19 ENCOUNTER — Encounter (HOSPITAL_COMMUNITY): Payer: Self-pay

## 2016-11-19 VITALS — BP 124/49 | HR 88 | Temp 98.0°F | Resp 18

## 2016-11-19 DIAGNOSIS — D631 Anemia in chronic kidney disease: Secondary | ICD-10-CM

## 2016-11-19 DIAGNOSIS — N058 Unspecified nephritic syndrome with other morphologic changes: Secondary | ICD-10-CM

## 2016-11-19 DIAGNOSIS — N189 Chronic kidney disease, unspecified: Secondary | ICD-10-CM | POA: Diagnosis not present

## 2016-11-19 DIAGNOSIS — N184 Chronic kidney disease, stage 4 (severe): Secondary | ICD-10-CM | POA: Diagnosis not present

## 2016-11-19 LAB — FERRITIN: FERRITIN: 90 ng/mL (ref 11–307)

## 2016-11-19 LAB — BASIC METABOLIC PANEL
Anion gap: 6 (ref 5–15)
BUN: 84 mg/dL — AB (ref 6–20)
CALCIUM: 8.4 mg/dL — AB (ref 8.9–10.3)
CO2: 28 mmol/L (ref 22–32)
CREATININE: 2.33 mg/dL — AB (ref 0.44–1.00)
Chloride: 108 mmol/L (ref 101–111)
GFR calc Af Amer: 23 mL/min — ABNORMAL LOW (ref >60)
GFR calc non Af Amer: 20 mL/min — ABNORMAL LOW (ref >60)
Glucose, Bld: 114 mg/dL — ABNORMAL HIGH (ref 65–99)
Potassium: 3.7 mmol/L (ref 3.5–5.1)
SODIUM: 142 mmol/L (ref 135–145)

## 2016-11-19 LAB — CBC WITH DIFFERENTIAL/PLATELET
Basophils Absolute: 0 10*3/uL (ref 0.0–0.1)
Basophils Relative: 0 %
EOS ABS: 0.1 10*3/uL (ref 0.0–0.7)
EOS PCT: 1 %
HCT: 28.1 % — ABNORMAL LOW (ref 36.0–46.0)
Hemoglobin: 8.7 g/dL — ABNORMAL LOW (ref 12.0–15.0)
LYMPHS ABS: 0.7 10*3/uL (ref 0.7–4.0)
Lymphocytes Relative: 10 %
MCH: 27.3 pg (ref 26.0–34.0)
MCHC: 31 g/dL (ref 30.0–36.0)
MCV: 88.1 fL (ref 78.0–100.0)
MONO ABS: 0.2 10*3/uL (ref 0.1–1.0)
MONOS PCT: 4 %
Neutro Abs: 5.7 10*3/uL (ref 1.7–7.7)
Neutrophils Relative %: 85 %
PLATELETS: 231 10*3/uL (ref 150–400)
RBC: 3.19 MIL/uL — ABNORMAL LOW (ref 3.87–5.11)
RDW: 18.1 % — AB (ref 11.5–15.5)
WBC: 6.7 10*3/uL (ref 4.0–10.5)

## 2016-11-19 LAB — IRON AND TIBC
Iron: 32 ug/dL (ref 28–170)
Saturation Ratios: 11 % (ref 10.4–31.8)
TIBC: 291 ug/dL (ref 250–450)
UIBC: 259 ug/dL

## 2016-11-19 MED ORDER — DARBEPOETIN ALFA 150 MCG/0.3ML IJ SOSY
125.0000 ug | PREFILLED_SYRINGE | Freq: Once | INTRAMUSCULAR | Status: AC
  Administered 2016-11-19: 125 ug via SUBCUTANEOUS
  Filled 2016-11-19: qty 0.3

## 2016-11-19 NOTE — Patient Instructions (Signed)
[name] at [facility] Discharge Instructions  RECOMMENDATIONS MADE BY THE CONSULTANT AND ANY TEST RESULTS WILL BE SENT TO YOUR REFERRING PHYSICIAN.  Aranesp today.    Thank you for choosing  [facility] Center at [facility] to provide your oncology and hematology care.  To afford each patient quality time with our provider, please arrive at least 15 minutes before your scheduled appointment time.    If you have a lab appointment with the Cancer Center please come in thru the  Main Entrance and check in at the main information desk  You need to re-schedule your appointment should you arrive 10 or more minutes late.  We strive to give you quality time with our providers, and arriving late affects you and other patients whose appointments are after yours.  Also, if you no show three or more times for appointments you may be dismissed from the clinic at the providers discretion.     Again, thank you for choosing [facility].  Our hope is that these requests will decrease the amount of time that you wait before being seen by our physicians.       _____________________________________________________________  Should you have questions after your visit to [facility], please contact our office at [phone] between the hours of 8:30 a.m. and 4:30 p.m.  Voicemails left after 4:30 p.m. will not be returned until the following business day.  For prescription refill requests, have your pharmacy contact our office.       Resources For Cancer Patients and their Caregivers ? American Cancer Society: Can assist with transportation, wigs, general needs, runs Look Good Feel Better.        1-888-227-6333 ? Cancer Care: Provides financial assistance, online support groups, medication/co-pay assistance.  1-800-813-HOPE (4673) ? [facility] Assists [place] cancer patients and their families through emotional  , educational and financial support.  336-427-4357 ? [place] DSS Where to apply for food stamps, Medicaid and utility assistance. 336-342-1394 ? RCATS: Transportation to medical appointments. 336-347-2287 ? Social Security Administration: May apply for disability if have a Stage IV cancer. 336-342-7796 1-800-772-1213 ? [place] Aging, Disability and Transit Services: Assists with nutrition, care and transit needs. 336-349-2343  Cancer Center Support Programs: @10RELATIVEDAYS@ > Cancer Support Group  2nd Tuesday of the month 1pm-2pm, Journey Room  > Creative Journey  3rd Tuesday of the month 1130am-1pm, Journey Room  > Look Good Feel Better  1st Wednesday of the month 10am-12 noon, Journey Room (Call [employer] to register 1-800-395-5775)    

## 2016-11-19 NOTE — Progress Notes (Signed)
[name] presents today for injection per MD orders. Aranesp 117mcg administered SQ in mid Abdomen. Administration without incident. Patient tolerated well.

## 2016-11-24 ENCOUNTER — Other Ambulatory Visit: Payer: Self-pay | Admitting: Cardiology

## 2016-11-27 DIAGNOSIS — Z79899 Other long term (current) drug therapy: Secondary | ICD-10-CM | POA: Diagnosis not present

## 2016-11-27 DIAGNOSIS — Z961 Presence of intraocular lens: Secondary | ICD-10-CM | POA: Diagnosis not present

## 2016-11-27 DIAGNOSIS — E113213 Type 2 diabetes mellitus with mild nonproliferative diabetic retinopathy with macular edema, bilateral: Secondary | ICD-10-CM | POA: Diagnosis not present

## 2016-11-27 DIAGNOSIS — H43821 Vitreomacular adhesion, right eye: Secondary | ICD-10-CM | POA: Diagnosis not present

## 2016-11-28 DIAGNOSIS — E113493 Type 2 diabetes mellitus with severe nonproliferative diabetic retinopathy without macular edema, bilateral: Secondary | ICD-10-CM | POA: Diagnosis not present

## 2016-11-28 DIAGNOSIS — E11311 Type 2 diabetes mellitus with unspecified diabetic retinopathy with macular edema: Secondary | ICD-10-CM | POA: Diagnosis not present

## 2016-11-28 DIAGNOSIS — H401111 Primary open-angle glaucoma, right eye, mild stage: Secondary | ICD-10-CM | POA: Diagnosis not present

## 2016-11-28 DIAGNOSIS — H401123 Primary open-angle glaucoma, left eye, severe stage: Secondary | ICD-10-CM | POA: Diagnosis not present

## 2016-11-28 DIAGNOSIS — E1129 Type 2 diabetes mellitus with other diabetic kidney complication: Secondary | ICD-10-CM | POA: Diagnosis not present

## 2016-12-01 ENCOUNTER — Ambulatory Visit (INDEPENDENT_AMBULATORY_CARE_PROVIDER_SITE_OTHER): Payer: Medicare Other | Admitting: Cardiology

## 2016-12-01 ENCOUNTER — Encounter: Payer: Self-pay | Admitting: Cardiology

## 2016-12-01 ENCOUNTER — Ambulatory Visit (INDEPENDENT_AMBULATORY_CARE_PROVIDER_SITE_OTHER): Payer: Medicare Other | Admitting: *Deleted

## 2016-12-01 VITALS — BP 118/68 | Ht 65.0 in | Wt 212.0 lb

## 2016-12-01 DIAGNOSIS — I4892 Unspecified atrial flutter: Secondary | ICD-10-CM | POA: Diagnosis not present

## 2016-12-01 DIAGNOSIS — I10 Essential (primary) hypertension: Secondary | ICD-10-CM

## 2016-12-01 DIAGNOSIS — Z5181 Encounter for therapeutic drug level monitoring: Secondary | ICD-10-CM | POA: Diagnosis not present

## 2016-12-01 LAB — POCT INR: INR: 3.4

## 2016-12-01 NOTE — Patient Instructions (Signed)
Your physician wants you to follow-up in: 6 months with Dr.McDowell You will receive a reminder letter in the mail two months in advance. If you don't receive a letter, please call our office to schedule the follow-up appointment.     Your physician recommends that you continue on your current medications as directed. Please refer to the Current Medication list given to you today.     Thank you for choosing [facility] !        

## 2016-12-01 NOTE — Progress Notes (Signed)
Cardiology Office Note  Date: 12/01/2016   ID: [name], DOB 08/21/1946, MRN [id number]  PCP: [name], MD  Primary Cardiologist: Rozann Lesches, MD   Chief Complaint  Patient presents with  . History of atrial flutter    History of Present Illness: [name] is a 71 y.o. female last seen in August 2017. She is here today with her husband for a follow-up visit. Reports no palpitations. Does state that she had worsening shortness of breath back in January, possibly some chest congestion at that time, but these symptoms have resolved. She continues to follow with Dr. Hilma Favors.  She continues to follow in the anticoagulation clinic on Coumadin. No reported bleeding episodes.  Last echocardiogram from 2015 is outlined below.  Past Medical History:  Diagnosis Date  . Anemia   . Asthma   . Atrial flutter [facility])    TEE/DCCV December 2013 - on Xarelto [facility])  . Bone spur of toe    Right 5th toe  . CKD (chronic kidney disease) stage 3, GFR 30-59 ml/min   . DVT of upper extremity (deep vein thrombosis) ([place])    Right basilic vein, June 3299  . Essential hypertension, benign   . SLE (systemic lupus erythematosus) ([place])   . Type 2 diabetes mellitus ([place])   . UTI (lower urinary tract infection)     Current Outpatient Prescriptions  Medication Sig Dispense Refill  . acetaminophen (TYLENOL) 500 MG tablet Take 1,000 mg by mouth every 6 (six) hours as needed. For pain    . albuterol (ACCUNEB) 0.63 MG/3ML nebulizer solution Take 1 ampule by nebulization every 6 (six) hours as needed for wheezing or shortness of breath.     Marland Kitchen albuterol (PROVENTIL HFA;VENTOLIN HFA) 108 (90 BASE) MCG/ACT inhaler Inhale 2 puffs into the lungs every 6 (six) hours as needed. For shortness of breath    . amLODipine (NORVASC) 10 MG tablet Take 1 tablet (10 mg total) by mouth at bedtime. 30 tablet 0  . atorvastatin (LIPITOR) 40 MG tablet Take 20 mg by mouth at bedtime.     .  brimonidine-timolol (COMBIGAN) 0.2-0.5 % ophthalmic solution Place 1 drop into the left eye daily.     . calcitRIOL (ROCALTROL) 0.25 MCG capsule Take 0.25 mcg by mouth as directed. Take on Mondays Wednesdays and Fridays    . diclofenac (VOLTAREN) 0.1 % ophthalmic solution Place 1 drop into the left eye daily. Uses at lunch time    . febuxostat (ULORIC) 40 MG tablet Take 40 mg by mouth daily.    . folic acid (FOLVITE) 1 MG tablet Take 1 mg by mouth daily.    . furosemide (LASIX) 80 MG tablet Take 160 mg by mouth 2 (two) times daily.     Marland Kitchen HUMALOG KWIKPEN 100 UNIT/ML KiwkPen INJECT UP TO 11 UNITS SUBCUTANEOUSLY BEFORE MEALS THREE TIMES DAILY. 15 mL 2  . HYDROcodone-acetaminophen (NORCO/VICODIN) 5-325 MG per tablet Take 1-2 tablets by mouth every 4 (four) hours as needed for moderate pain. 10 tablet 0  . hydroxychloroquine (PLAQUENIL) 200 MG tablet Take 200 mg by mouth as directed. Take 1 1/2 tablets daily    . insulin aspart (NOVOLOG) 100 UNIT/ML injection Inject 5-11 Units into the skin 3 (three) times daily with meals.    . Insulin Detemir (LEVEMIR) 100 UNIT/ML Pen Inject 12 Units into the skin at bedtime. 15 mL 2  . metoprolol (LOPRESSOR) 50 MG tablet TAKE ONE TABLET BY MOUTH TWICE DAILY. 60 tablet 0  .  Misc Natural Products (TART CHERRY ADVANCED) CAPS Take 1 capsule by mouth 2 (two) times daily.    . Multiple Vitamins-Minerals (CENTRUM SILVER PO) Take 1 tablet by mouth daily.    . nitrofurantoin (MACRODANTIN) 100 MG capsule Take 100 mg by mouth 2 (two) times daily.    . ONE TOUCH ULTRA TEST test strip TEST 4 TIMES A DAY AS DIRECTED. 150 each 5  . ONETOUCH DELICA LANCETS 27C MISC USE TO TEST 4 TIMES DAILY. 100 each 5  . polyethylene glycol-electrolytes (TRILYTE) 420 g solution Take 4,000 mLs by mouth as directed. 4000 mL 0  . predniSONE (DELTASONE) 5 MG tablet Take 5 mg by mouth daily with breakfast.    . travoprost, benzalkonium, (TRAVATAN) 0.004 % ophthalmic solution Place 1 drop into the left  eye at bedtime.    Marland Kitchen UNIFINE PENTIPS 31G X 6 MM MISC USE AS DIRECTED AT BEDTIME WITH LEVEMIR AND WITH SLIDING SCALE INSULIN. 100 each 5  . Vitamin D, Ergocalciferol, (DRISDOL) 50000 units CAPS capsule TAKE 1 CAPSULE BY MOUTH ONCE WEEKLY. 12 capsule 0  . warfarin (COUMADIN) 5 MG tablet Take 1 tablet daily except 1 1/2 tablets on Mondays, Wednesdays and Fridays 180 tablet 3   No current facility-administered medications for this visit.    Allergies:  Ace inhibitors   Social History: The patient  reports that she quit smoking about 4 years ago. Her smoking use included Cigarettes. She has a 30.00 pack-year smoking history. She has never used smokeless tobacco. She reports that she does not drink alcohol or use drugs.   ROS:  Please see the history of present illness. Otherwise, complete review of systems is positive for intermittent fatigue, no syncope.  All other systems are reviewed and negative.   Physical Exam: VS:  BP 118/68   Ht 5\' 5"  (1.651 m)   Wt 212 lb (96.2 kg)   BMI 35.28 kg/m , BMI Body mass index is 35.28 kg/m.  Wt Readings from Last 3 Encounters:  12/01/16 212 lb (96.2 kg)  10/23/16 212 lb (96.2 kg)  10/22/16 213 lb 11.2 oz (96.9 kg)    Obese woman, appears comfortable at rest.  HEENT: Conjunctiva and lids normal, oropharynx clear.  Neck: Supple, no elevated JVP or carotid bruits, no thyromegaly.  Lungs: Clear to auscultation, nonlabored breathing at rest.  Cardiac: Regular rate and rhythm, no S3, 2/6 systolic murmur with soft diastolic murmur, no pericardial rub.  Abdomen: Soft, nontender, bowel sounds present, no guarding or rebound.  Extremities: 1+ edema, distal pulses 2+.   ECG: I personally reviewed the tracing from 03/19/2016 which showed sinus rhythm with LVH and probable repolarization abnormalities.  Recent Labwork: 10/17/2016: ALT 15; AST 17 11/19/2016: BUN 84; Creatinine, Ser 2.33; Hemoglobin 8.7; Platelets 231; Potassium 3.7; Sodium 142   Other  Studies Reviewed Today:  Echocardiogram 03/14/2014: Study Conclusions  - Left ventricle: The cavity size was normal. Wall thickness was increased increased in a pattern of mild to moderate LVH. Systolic function was normal. The estimated ejection fraction was in the range of 50% to 55%. Doppler parameters are consistent with an irreversible restrictive pattern, indicative of decreased left ventricular diastolic compliance and/or increased left atrial pressure (grade 4 diastolic dysfunction). - Aortic valve: Mildly calcified annulus. Trileaflet; mildly thickened leaflets. There was moderate regurgitation. Regurgitation pressure half-time: 324 ms. - Aortic root: The aortic root was normal in size. - Mitral valve: Mildly calcified annulus. Moderately thickened leaflets . There was mild regurgitation. - Left atrium: The atrium  was moderately dilated. - Pulmonary veins: There is systolic blunting of pulmonary vein flow suggestive of elevated LA pressure. - Right ventricle: The cavity size was mildly to moderately dilated. - Right atrium: The atrium was moderately dilated. - Tricuspid valve: There was moderate-severe regurgitation. The jet is eccentric and posterior, this may lead to underestimation of severity. The TR vena contracta measures 0.4 cm. There is systolic reversal of hepatic vein flow suggestive of severe TR. The morphological etiology of the TR is unclear from available images. - Pulmonary arteries: Systolic pressure was moderately increased. PA peak pressure: 61 mm Hg (S). Pulmonary pressure may be underestimated in the setting of significant TR. - Inferior vena cava: The vessel was dilated. The respirophasic diameter changes were blunted (<50%), consistent with elevated central venous pressure. - Technically adequate study.  Assesment and Plan:  1. Atrial flutter, no obvious recurrences on present regimen including Coumadin and  Lopressor. Keep follow-up in the anticoagulation clinic.  2. Essential hypertension, blood pressure is well controlled today.  3. History of shortness of breath, presently resolved. She does have prior documented pulmonary hypertension and diastolic dysfunction with restrictive physiology based on echocardiogram from 2015. I did discuss obtaining a follow-up study, she wanted to hold off for now. If symptoms recur and/or worsen we can discuss again.  Current medicines were reviewed with the patient today.  Disposition: Follow-up in 6 months.  Signed, Satira Sark, MD, [facility] 12/01/2016 11:47 AM    [place] [facility] [place]. [address], [place],  [address] Phone: [phone]; Fax: [phone]

## 2016-12-03 ENCOUNTER — Encounter (HOSPITAL_COMMUNITY): Payer: Medicare Other | Attending: Oncology

## 2016-12-03 ENCOUNTER — Encounter (HOSPITAL_COMMUNITY): Payer: Medicare Other

## 2016-12-03 VITALS — BP 112/68 | HR 85 | Temp 97.7°F | Resp 18

## 2016-12-03 DIAGNOSIS — D631 Anemia in chronic kidney disease: Secondary | ICD-10-CM | POA: Insufficient documentation

## 2016-12-03 DIAGNOSIS — N058 Unspecified nephritic syndrome with other morphologic changes: Secondary | ICD-10-CM

## 2016-12-03 DIAGNOSIS — N184 Chronic kidney disease, stage 4 (severe): Secondary | ICD-10-CM | POA: Diagnosis not present

## 2016-12-03 DIAGNOSIS — N189 Chronic kidney disease, unspecified: Secondary | ICD-10-CM | POA: Diagnosis not present

## 2016-12-03 LAB — CBC WITH DIFFERENTIAL/PLATELET
Basophils Absolute: 0 10*3/uL (ref 0.0–0.1)
Basophils Relative: 0 %
EOS ABS: 0.1 10*3/uL (ref 0.0–0.7)
EOS PCT: 1 %
HCT: 31.4 % — ABNORMAL LOW (ref 36.0–46.0)
Hemoglobin: 9.7 g/dL — ABNORMAL LOW (ref 12.0–15.0)
LYMPHS ABS: 1 10*3/uL (ref 0.7–4.0)
LYMPHS PCT: 13 %
MCH: 26.6 pg (ref 26.0–34.0)
MCHC: 30.9 g/dL (ref 30.0–36.0)
MCV: 86 fL (ref 78.0–100.0)
MONO ABS: 0.3 10*3/uL (ref 0.1–1.0)
Monocytes Relative: 4 %
Neutro Abs: 6.3 10*3/uL (ref 1.7–7.7)
Neutrophils Relative %: 82 %
PLATELETS: 257 10*3/uL (ref 150–400)
RBC: 3.65 MIL/uL — ABNORMAL LOW (ref 3.87–5.11)
RDW: 17 % — ABNORMAL HIGH (ref 11.5–15.5)
WBC: 7.7 10*3/uL (ref 4.0–10.5)

## 2016-12-03 MED ORDER — DARBEPOETIN ALFA 150 MCG/0.3ML IJ SOSY
PREFILLED_SYRINGE | INTRAMUSCULAR | Status: AC
  Filled 2016-12-03: qty 0.3

## 2016-12-03 MED ORDER — DARBEPOETIN ALFA 150 MCG/0.3ML IJ SOSY
125.0000 ug | PREFILLED_SYRINGE | Freq: Once | INTRAMUSCULAR | Status: AC
  Administered 2016-12-03: 125 ug via SUBCUTANEOUS

## 2016-12-03 NOTE — Progress Notes (Signed)
[name] presents today for injection per MD orders. Aranesp 125 mcg administered SQ in left Abdomen. Administration without incident. Patient tolerated well.

## 2016-12-03 NOTE — Patient Instructions (Signed)
[place] at [facility] Discharge Instructions  RECOMMENDATIONS MADE BY THE CONSULTANT AND ANY TEST RESULTS WILL BE SENT TO YOUR REFERRING PHYSICIAN.  Hemoglobin 9.7 today. Aranesp 125 mcg injection given as ordered. Return as scheduled.  Thank you for choosing [place] at [facility] to provide your oncology and hematology care.  To afford each patient quality time with our provider, please arrive at least 15 minutes before your scheduled appointment time.    If you have a lab appointment with the [facility] please come in thru the  Main Entrance and check in at the main information desk  You need to re-schedule your appointment should you arrive 10 or more minutes late.  We strive to give you quality time with our providers, and arriving late affects you and other patients whose appointments are after yours.  Also, if you no show three or more times for appointments you may be dismissed from the clinic at the providers discretion.     Again, thank you for choosing [facility].  Our hope is that these requests will decrease the amount of time that you wait before being seen by our physicians.       _____________________________________________________________  Should you have questions after your visit to [facility], please contact our office at [phone] between the hours of 8:30 a.m. and 4:30 p.m.  Voicemails left after 4:30 p.m. will not be returned until the following business day.  For prescription refill requests, have your pharmacy contact our office.       Resources For Cancer Patients and their Caregivers ? American Cancer Society: Can assist with transportation, wigs, general needs, runs Look Good Feel Better.        289-464-8011 ? Cancer Care: Provides financial assistance, online support groups, medication/co-pay assistance.  1-800-813-HOPE (478)128-0883) ? [place] Assists [place] cancer patients and their families through emotional , educational and financial support.  (772) 203-5941 ? [place] DSS Where to apply for food stamps, Medicaid and utility assistance. 604-523-9047 ? RCATS: Transportation to medical appointments. 6036185908 ? Social Security Administration: May apply for disability if have a Stage IV cancer. 7378008563 502-278-9145 ? [employer], Disability and Transit Services: Assists with nutrition, care and transit needs. [place] Support Programs: @10RELATIVEDAYS @ > Cancer Support Group  2nd Tuesday of the month 1pm-2pm, Journey Room  > Creative Journey  3rd Tuesday of the month 1130am-1pm, Journey Room  > Look Good Feel Better  1st Wednesday of the month 10am-12 noon, Journey Room (Call [place] to register [phone])

## 2016-12-09 DIAGNOSIS — L89892 Pressure ulcer of other site, stage 2: Secondary | ICD-10-CM | POA: Diagnosis not present

## 2016-12-09 DIAGNOSIS — E1151 Type 2 diabetes mellitus with diabetic peripheral angiopathy without gangrene: Secondary | ICD-10-CM | POA: Diagnosis not present

## 2016-12-09 DIAGNOSIS — E1140 Type 2 diabetes mellitus with diabetic neuropathy, unspecified: Secondary | ICD-10-CM | POA: Diagnosis not present

## 2016-12-11 ENCOUNTER — Other Ambulatory Visit: Payer: Self-pay | Admitting: "Endocrinology

## 2016-12-17 ENCOUNTER — Encounter (HOSPITAL_COMMUNITY): Payer: Self-pay

## 2016-12-17 ENCOUNTER — Encounter (HOSPITAL_BASED_OUTPATIENT_CLINIC_OR_DEPARTMENT_OTHER): Payer: Medicare Other

## 2016-12-17 ENCOUNTER — Encounter (HOSPITAL_COMMUNITY): Payer: Medicare Other

## 2016-12-17 VITALS — BP 151/56 | HR 77 | Temp 98.0°F | Resp 20

## 2016-12-17 DIAGNOSIS — D631 Anemia in chronic kidney disease: Secondary | ICD-10-CM | POA: Diagnosis not present

## 2016-12-17 DIAGNOSIS — N189 Chronic kidney disease, unspecified: Secondary | ICD-10-CM

## 2016-12-17 DIAGNOSIS — N184 Chronic kidney disease, stage 4 (severe): Principal | ICD-10-CM

## 2016-12-17 DIAGNOSIS — N058 Unspecified nephritic syndrome with other morphologic changes: Secondary | ICD-10-CM

## 2016-12-17 LAB — CBC WITH DIFFERENTIAL/PLATELET
BASOS ABS: 0 10*3/uL (ref 0.0–0.1)
Basophils Relative: 0 %
EOS ABS: 0.1 10*3/uL (ref 0.0–0.7)
EOS PCT: 2 %
HCT: 31.1 % — ABNORMAL LOW (ref 36.0–46.0)
Hemoglobin: 9.9 g/dL — ABNORMAL LOW (ref 12.0–15.0)
LYMPHS PCT: 14 %
Lymphs Abs: 0.9 10*3/uL (ref 0.7–4.0)
MCH: 27 pg (ref 26.0–34.0)
MCHC: 31.8 g/dL (ref 30.0–36.0)
MCV: 84.7 fL (ref 78.0–100.0)
MONO ABS: 0.5 10*3/uL (ref 0.1–1.0)
Monocytes Relative: 7 %
NEUTROS ABS: 5.1 10*3/uL (ref 1.7–7.7)
Neutrophils Relative %: 77 %
PLATELETS: 258 10*3/uL (ref 150–400)
RBC: 3.67 MIL/uL — AB (ref 3.87–5.11)
RDW: 16.5 % — AB (ref 11.5–15.5)
WBC: 6.6 10*3/uL (ref 4.0–10.5)

## 2016-12-17 MED ORDER — DARBEPOETIN ALFA 150 MCG/0.3ML IJ SOSY
PREFILLED_SYRINGE | INTRAMUSCULAR | Status: AC
  Filled 2016-12-17: qty 0.3

## 2016-12-17 MED ORDER — DARBEPOETIN ALFA 150 MCG/0.3ML IJ SOSY
125.0000 ug | PREFILLED_SYRINGE | Freq: Once | INTRAMUSCULAR | Status: AC
  Administered 2016-12-17: 125 ug via SUBCUTANEOUS

## 2016-12-17 NOTE — Progress Notes (Signed)
[name] tolerated Aranesp injection well without complaints or incident. Hgb 9.9 today. VSS Pt discharged via wheelchair in satisfactory condition accompanied by her husband

## 2016-12-17 NOTE — Patient Instructions (Signed)
[name] at [facility] Discharge Instructions  RECOMMENDATIONS MADE BY THE CONSULTANT AND ANY TEST RESULTS WILL BE SENT TO YOUR REFERRING PHYSICIAN.  Received Aranesp today. Follow-up as scheduled. Call clinic for any questions or concerns  Thank you for choosing [place] at [facility] to provide your oncology and hematology care.  To afford each patient quality time with our provider, please arrive at least 15 minutes before your scheduled appointment time.    If you have a lab appointment with the [facility] please come in thru the  Main Entrance and check in at the main information desk  You need to re-schedule your appointment should you arrive 10 or more minutes late.  We strive to give you quality time with our providers, and arriving late affects you and other patients whose appointments are after yours.  Also, if you no show three or more times for appointments you may be dismissed from the clinic at the providers discretion.     Again, thank you for choosing [facility].  Our hope is that these requests will decrease the amount of time that you wait before being seen by our physicians.       _____________________________________________________________  Should you have questions after your visit to [facility], please contact our office at [phone] between the hours of 8:30 a.m. and 4:30 p.m.  Voicemails left after 4:30 p.m. will not be returned until the following business day.  For prescription refill requests, have your pharmacy contact our office.       Resources For Cancer Patients and their Caregivers ? American Cancer Society: Can assist with transportation, wigs, general needs, runs Look Good Feel Better.        617-589-1040 ? Cancer Care: Provides financial assistance, online support groups, medication/co-pay assistance.  1-800-813-HOPE 865 052 5617) ? [place] Assists [place] cancer patients and their families through emotional , educational and financial support.  267-576-9014 ? [place] DSS Where to apply for food stamps, Medicaid and utility assistance. 630-567-8486 ? RCATS: Transportation to medical appointments. 7750831280 ? Social Security Administration: May apply for disability if have a Stage IV cancer. 3802833319 414-118-9112 ? [employer], Disability and Transit Services: Assists with nutrition, care and transit needs. [place] Support Programs: @10RELATIVEDAYS @ > Cancer Support Group  2nd Tuesday of the month 1pm-2pm, Journey Room  > Creative Journey  3rd Tuesday of the month 1130am-1pm, Journey Room  > Look Good Feel Better  1st Wednesday of the month 10am-12 noon, Journey Room (Call [place] to register [phone])

## 2016-12-22 ENCOUNTER — Ambulatory Visit (INDEPENDENT_AMBULATORY_CARE_PROVIDER_SITE_OTHER): Payer: Medicare Other | Admitting: *Deleted

## 2016-12-22 ENCOUNTER — Other Ambulatory Visit: Payer: Self-pay | Admitting: "Endocrinology

## 2016-12-22 DIAGNOSIS — Z5181 Encounter for therapeutic drug level monitoring: Secondary | ICD-10-CM | POA: Diagnosis not present

## 2016-12-22 DIAGNOSIS — I4892 Unspecified atrial flutter: Secondary | ICD-10-CM | POA: Diagnosis not present

## 2016-12-22 LAB — POCT INR: INR: 4.8

## 2016-12-31 ENCOUNTER — Encounter (HOSPITAL_COMMUNITY): Payer: Medicare Other | Attending: Oncology

## 2016-12-31 ENCOUNTER — Encounter (HOSPITAL_COMMUNITY): Payer: Medicare Other

## 2016-12-31 VITALS — BP 144/45 | HR 67 | Temp 97.9°F | Resp 20

## 2016-12-31 DIAGNOSIS — N058 Unspecified nephritic syndrome with other morphologic changes: Secondary | ICD-10-CM

## 2016-12-31 DIAGNOSIS — D631 Anemia in chronic kidney disease: Secondary | ICD-10-CM | POA: Insufficient documentation

## 2016-12-31 DIAGNOSIS — N184 Chronic kidney disease, stage 4 (severe): Secondary | ICD-10-CM | POA: Insufficient documentation

## 2016-12-31 DIAGNOSIS — N189 Chronic kidney disease, unspecified: Secondary | ICD-10-CM

## 2016-12-31 LAB — CBC WITH DIFFERENTIAL/PLATELET
BASOS ABS: 0 10*3/uL (ref 0.0–0.1)
BASOS PCT: 0 %
EOS ABS: 0.1 10*3/uL (ref 0.0–0.7)
Eosinophils Relative: 1 %
HCT: 28.8 % — ABNORMAL LOW (ref 36.0–46.0)
HEMOGLOBIN: 8.8 g/dL — AB (ref 12.0–15.0)
Lymphocytes Relative: 9 %
Lymphs Abs: 0.7 10*3/uL (ref 0.7–4.0)
MCH: 26 pg (ref 26.0–34.0)
MCHC: 30.6 g/dL (ref 30.0–36.0)
MCV: 85 fL (ref 78.0–100.0)
MONO ABS: 0.6 10*3/uL (ref 0.1–1.0)
MONOS PCT: 8 %
NEUTROS PCT: 82 %
Neutro Abs: 6.6 10*3/uL (ref 1.7–7.7)
Platelets: 318 10*3/uL (ref 150–400)
RBC: 3.39 MIL/uL — ABNORMAL LOW (ref 3.87–5.11)
RDW: 16.1 % — AB (ref 11.5–15.5)
WBC: 8 10*3/uL (ref 4.0–10.5)

## 2016-12-31 MED ORDER — DARBEPOETIN ALFA 150 MCG/0.3ML IJ SOSY
125.0000 ug | PREFILLED_SYRINGE | Freq: Once | INTRAMUSCULAR | Status: AC
  Administered 2016-12-31: 125 ug via SUBCUTANEOUS

## 2016-12-31 MED ORDER — DARBEPOETIN ALFA 150 MCG/0.3ML IJ SOSY
PREFILLED_SYRINGE | INTRAMUSCULAR | Status: AC
  Filled 2016-12-31: qty 0.3

## 2016-12-31 NOTE — Progress Notes (Signed)
[name] presents today for injection per MD orders. Aranesp 126mcg administered SQ in left Abdomen. Administration without incident. Patient tolerated well.

## 2017-01-02 ENCOUNTER — Other Ambulatory Visit: Payer: Self-pay | Admitting: "Endocrinology

## 2017-01-05 ENCOUNTER — Ambulatory Visit (INDEPENDENT_AMBULATORY_CARE_PROVIDER_SITE_OTHER): Payer: Medicare Other | Admitting: *Deleted

## 2017-01-05 DIAGNOSIS — Z5181 Encounter for therapeutic drug level monitoring: Secondary | ICD-10-CM | POA: Diagnosis not present

## 2017-01-05 DIAGNOSIS — I4892 Unspecified atrial flutter: Secondary | ICD-10-CM | POA: Diagnosis not present

## 2017-01-05 LAB — POCT INR: INR: 3.1

## 2017-01-09 ENCOUNTER — Other Ambulatory Visit: Payer: Self-pay | Admitting: "Endocrinology

## 2017-01-09 DIAGNOSIS — E1122 Type 2 diabetes mellitus with diabetic chronic kidney disease: Secondary | ICD-10-CM | POA: Diagnosis not present

## 2017-01-09 DIAGNOSIS — N184 Chronic kidney disease, stage 4 (severe): Secondary | ICD-10-CM | POA: Diagnosis not present

## 2017-01-09 DIAGNOSIS — Z794 Long term (current) use of insulin: Secondary | ICD-10-CM | POA: Diagnosis not present

## 2017-01-09 DIAGNOSIS — E1129 Type 2 diabetes mellitus with other diabetic kidney complication: Secondary | ICD-10-CM | POA: Diagnosis not present

## 2017-01-10 LAB — COMPREHENSIVE METABOLIC PANEL
ALBUMIN: 3.4 g/dL — AB (ref 3.5–4.8)
ALT: 22 IU/L (ref 0–32)
AST: 26 IU/L (ref 0–40)
Albumin/Globulin Ratio: 0.9 — ABNORMAL LOW (ref 1.2–2.2)
Alkaline Phosphatase: 57 IU/L (ref 39–117)
BILIRUBIN TOTAL: 0.2 mg/dL (ref 0.0–1.2)
BUN / CREAT RATIO: 32 — AB (ref 12–28)
BUN: 77 mg/dL — AB (ref 8–27)
CHLORIDE: 103 mmol/L (ref 96–106)
CO2: 25 mmol/L (ref 18–29)
Calcium: 8.4 mg/dL — ABNORMAL LOW (ref 8.7–10.3)
Creatinine, Ser: 2.39 mg/dL — ABNORMAL HIGH (ref 0.57–1.00)
GFR calc non Af Amer: 20 mL/min/{1.73_m2} — ABNORMAL LOW (ref >59)
GFR, EST AFRICAN AMERICAN: 23 mL/min/{1.73_m2} — AB (ref >59)
GLUCOSE: 58 mg/dL — AB (ref 65–99)
Globulin, Total: 3.7 g/dL (ref 1.5–4.5)
Potassium: 4 mmol/L (ref 3.5–5.2)
Sodium: 144 mmol/L (ref 134–144)
TOTAL PROTEIN: 7.1 g/dL (ref 6.0–8.5)

## 2017-01-10 LAB — HGB A1C W/O EAG: Hgb A1c MFr Bld: 5.8 % — ABNORMAL HIGH (ref 4.8–5.6)

## 2017-01-14 ENCOUNTER — Encounter (HOSPITAL_COMMUNITY): Payer: Self-pay | Admitting: Oncology

## 2017-01-14 ENCOUNTER — Encounter (HOSPITAL_BASED_OUTPATIENT_CLINIC_OR_DEPARTMENT_OTHER): Payer: Medicare Other | Admitting: Oncology

## 2017-01-14 ENCOUNTER — Encounter (HOSPITAL_COMMUNITY): Payer: Medicare Other

## 2017-01-14 ENCOUNTER — Encounter (HOSPITAL_BASED_OUTPATIENT_CLINIC_OR_DEPARTMENT_OTHER): Payer: Medicare Other

## 2017-01-14 VITALS — BP 155/38 | HR 68 | Temp 97.8°F | Resp 18 | Wt 212.8 lb

## 2017-01-14 DIAGNOSIS — N184 Chronic kidney disease, stage 4 (severe): Principal | ICD-10-CM

## 2017-01-14 DIAGNOSIS — N058 Unspecified nephritic syndrome with other morphologic changes: Secondary | ICD-10-CM

## 2017-01-14 DIAGNOSIS — D631 Anemia in chronic kidney disease: Secondary | ICD-10-CM | POA: Diagnosis not present

## 2017-01-14 DIAGNOSIS — N189 Chronic kidney disease, unspecified: Secondary | ICD-10-CM

## 2017-01-14 LAB — CBC WITH DIFFERENTIAL/PLATELET
Basophils Absolute: 0 10*3/uL (ref 0.0–0.1)
Basophils Relative: 0 %
Eosinophils Absolute: 0.1 10*3/uL (ref 0.0–0.7)
Eosinophils Relative: 2 %
HEMATOCRIT: 29.9 % — AB (ref 36.0–46.0)
HEMOGLOBIN: 9.3 g/dL — AB (ref 12.0–15.0)
LYMPHS ABS: 0.9 10*3/uL (ref 0.7–4.0)
LYMPHS PCT: 14 %
MCH: 26.6 pg (ref 26.0–34.0)
MCHC: 31.1 g/dL (ref 30.0–36.0)
MCV: 85.7 fL (ref 78.0–100.0)
MONOS PCT: 5 %
Monocytes Absolute: 0.4 10*3/uL (ref 0.1–1.0)
NEUTROS ABS: 5.1 10*3/uL (ref 1.7–7.7)
NEUTROS PCT: 79 %
Platelets: 240 10*3/uL (ref 150–400)
RBC: 3.49 MIL/uL — ABNORMAL LOW (ref 3.87–5.11)
RDW: 16.9 % — ABNORMAL HIGH (ref 11.5–15.5)
WBC: 6.4 10*3/uL (ref 4.0–10.5)

## 2017-01-14 LAB — OCCULT BLOOD X 1 CARD TO LAB, STOOL
FECAL OCCULT BLD: NEGATIVE
Fecal Occult Bld: NEGATIVE

## 2017-01-14 MED ORDER — DARBEPOETIN ALFA 150 MCG/0.3ML IJ SOSY
PREFILLED_SYRINGE | INTRAMUSCULAR | Status: AC
  Filled 2017-01-14: qty 0.3

## 2017-01-14 MED ORDER — DARBEPOETIN ALFA 150 MCG/0.3ML IJ SOSY
125.0000 ug | PREFILLED_SYRINGE | Freq: Once | INTRAMUSCULAR | Status: AC
  Administered 2017-01-14: 125 ug via SUBCUTANEOUS
  Filled 2017-01-14: qty 0.3

## 2017-01-14 NOTE — Progress Notes (Signed)
Dana Kilts, MD [place] [address]    DIAGNOSIS:   Anemia Folic acid deficiency CKD Focal segmental glomerulosclerosis without nephrosis or chronic glomerulonephritis SLE Coumadin, history of UE DVT, a-flutter     CURRENT THERAPY:  Aranesp IV iron  INTERVAL HISTORY: [name] 71 y.o. female returns for follow-up of anemia. She has lupus and FSG. She also has a history of folic acid deficiency. She has ongoing Coumadin use because of a history of an upper extremity DVT and atrial flutter. Coumadin is currently being continued for stroke prophylaxis. She has a fistula placed in preparation for dialysis.  She is doing well today other than some aching in her joints and fatigue. She has some pain from her arthritis as well. Denies chest pain, SOB, abdominal pain, leg swelling, loss of appetite, or any other concerns.   MEDICAL HISTORY: Past Medical History:  Diagnosis Date  . Anemia   . Asthma   . Atrial flutter [facility])    TEE/DCCV December 2013 - on Xarelto [facility])  . Bone spur of toe    Right 5th toe  . CKD (chronic kidney disease) stage 3, GFR 30-59 ml/min   . DVT of upper extremity (deep vein thrombosis) ([place])    Right basilic vein, June 0737  . Essential hypertension, benign   . SLE (systemic lupus erythematosus) ([place])   . Type 2 diabetes mellitus ([place])   . 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     is allergic to ace inhibitors.  [name] had no medications administered during this visit.  SURGICAL HISTORY: Past Surgical History:  Procedure Laterality Date  . ABDOMINAL HYSTERECTOMY  1983  . ANKLE SURGERY  1993  . AV FISTULA PLACEMENT Left 03/27/2014   Procedure: ARTERIOVENOUS (AV) FISTULA CREATION;  Surgeon: Rosetta Posner, MD;  Location: [facility];  Service: Vascular;  Laterality: Left;  . BACK SURGERY  1980  . CARDIOVASCULAR STRESS TEST  12/19/2009   no stress induced rhythm abnormalities, ekg negative for ischemia  . CARDIOVERSION  09/16/2012   Procedure: CARDIOVERSION;  Surgeon: Sanda Klein, MD;  Location: [facility] ENDOSCOPY;  Service: Cardiovascular;  Laterality: N/A;  . COLONOSCOPY  09/20/2012   Dr. [name]: multiple tubular adenomas   . COLONOSCOPY  2005   Dr. [name]. Polyps, path unknown   . COLONOSCOPY N/A 07/24/2016   Procedure: COLONOSCOPY;  Surgeon: [name], MD;  Location[facility] ENDO SUITE;  Service: Endoscopy;  Laterality: N/A;  9:00 am  . ESOPHAGOGASTRODUODENOSCOPY  09/20/2012   Dr. [name]: duodenal erosion and possible resolving ulcer at the angularis   . TEE WITHOUT CARDIOVERSION  09/16/2012   Procedure: TRANSESOPHAGEAL ECHOCARDIOGRAM (TEE);  Surgeon: Sanda Klein, MD;  Location: [facility] ENDOSCOPY;  Service: Cardiovascular;  Laterality: N/A;  . TRANSESOPHAGEAL ECHOCARDIOGRAM WITH CARDIOVERSION  09/16/2012   EF 60-65%, moderate LVH, moderate regurg of the aortic valve,  LA moderately dilated    SOCIAL HISTORY: Social History   Social History  . Marital status: Married    Spouse name: N/A  . Number of children: N/A  . Years of education: N/A   Occupational History  . Not on file.   Social History Main Topics  . Smoking status: Former  Smoker    Packs/day: 1.00    Years: 30.00    Types: Cigarettes    Quit date: 08/29/2012  . Smokeless tobacco: Never Used     Comment: quit 08-2012  . Alcohol use No  . Drug use: No  . Sexual activity: Yes    Partners: Male   Other Topics Concern  . Not on file   Social History Narrative  . No narrative on file    FAMILY HISTORY: Family History  Problem Relation Age of Onset  . Diabetes Brother   . Diabetes Father   . Hypertension Father   . Hypertension Sister   . Stroke Mother   . Diabetes Sister   . Hypertension Brother   . Colon cancer Neg Hx    Review of Systems  Constitutional: Positive for malaise/fatigue.       No loss of appetite   HENT: Negative.   Eyes: Negative.   Respiratory: Negative.  Negative for shortness of breath.   Cardiovascular: Negative.  Negative for chest pain and leg swelling.  Gastrointestinal: Negative.  Negative for abdominal pain.  Genitourinary: Negative.   Musculoskeletal: Positive for joint pain (arthritis) and myalgias.  Skin: Negative.   Neurological: Negative.   Endo/Heme/Allergies: Negative.   Psychiatric/Behavioral: Negative.   All other systems reviewed and are negative. 14 point review of systems was performed and is negative except as detailed under history of present illness and above  PHYSICAL EXAMINATION ECOG PERFORMANCE STATUS: 1 - Symptomatic but completely ambulatory  Vitals:   01/14/17 1110  BP: (!) 155/38  Pulse: 68  Resp: 18  Temp: 97.8 F (36.6 C)   Physical Exam  Constitutional: She is oriented to person, place, and time and well-developed, well-nourished, and in no distress.  HENT:  Head: Normocephalic and atraumatic.  Mouth/Throat: Oropharynx is clear and moist.  Dentures top and bottom  Eyes: Conjunctivae and EOM are normal. Pupils are equal, round, and reactive to light.  Neck: Normal range of motion. Neck supple.  Cardiovascular: Normal rate, regular rhythm and normal heart sounds.     Pulmonary/Chest: Effort normal and breath sounds normal.  Abdominal: Soft. Bowel sounds are normal.  Musculoskeletal: Normal range of motion.  Neurological: She is alert and oriented to person, place, and time. Gait normal.  Skin: Skin is warm and dry.  Nursing note and vitals reviewed.  LABORATORY DATA: I have reviewed the data as listed. CBC CBC Latest Ref Rng & Units 01/14/2017 12/31/2016 12/17/2016  WBC 4.0 - 10.5 K/uL 6.4 8.0 6.6  Hemoglobin 12.0 - 15.0 g/dL 9.3(L) 8.8(L) 9.9(L)  Hematocrit 36.0 - 46.0 % 29.9(L) 28.8(L) 31.1(L)  Platelets 150 - 400 K/uL 240 318 258   CMP     Component Value Date/Time   NA 144 01/09/2017 1135   K 4.0 01/09/2017 1135   CL 103 01/09/2017 1135   CO2 25 01/09/2017 1135   GLUCOSE 58 (L) 01/09/2017 1135   GLUCOSE 114 (H) 11/19/2016 1236   BUN 77 (HH) 01/09/2017 1135   CREATININE 2.39 (H) 01/09/2017 1135   CALCIUM 8.4 (L) 01/09/2017 1135   CALCIUM 7.0 (L) 03/16/2014 0835   PROT 7.1 01/09/2017 1135  ALBUMIN 3.4 (L) 01/09/2017 1135   AST 26 01/09/2017 1135   ALT 22 01/09/2017 1135   ALKPHOS 57 01/09/2017 1135   BILITOT 0.2 01/09/2017 1135   GFRNONAA 20 (L) 01/09/2017 1135   GFRAA 23 (L) 01/09/2017 1135    ASSESSMENT and THERAPY PLAN:   Anemia Iron deficiency Lupus Stage IV Kidney disease, FSG History of heme positive stool   71 year old female with lupus and kidney disease, FSG. She has underlying anemia which is certainly multi factorial including folic acid deficiency, her chronic kidney disease, and anemia of chronic disease from her lupus.   PLAN: Labs reviewed. Hgb is 9.3; Aranesp injection today.   Continue labs and araneso every 2 weeks.   She will return for follow up in 4 months with labs.  Orders Placed This Encounter  Procedures  . CBC with Differential    Standing Status:   Future    Standing Expiration Date:   07/16/2017  . Comprehensive metabolic panel    Standing Status:   Future    Standing Expiration Date:    07/16/2017  . Iron and TIBC    Standing Status:   Future    Standing Expiration Date:   07/16/2017  . Ferritin    Standing Status:   Future    Standing Expiration Date:   07/16/2017      All questions were answered. The patient knows to call the clinic with any problems, questions or concerns. We can certainly see the patient much sooner if necessary. This note was electronically signed.  This document serves as a record of services personally performed by Twana First, MD. It was created on her behalf by Martinique Casey, a trained medical scribe. The creation of this record is based on the scribe's personal observations and the provider's statements to them. This document has been checked and approved by the attending provider.  I have reviewed the above documentation for accuracy and completeness, and I agree with the above. Martinique M Casey

## 2017-01-14 NOTE — Addendum Note (Signed)
Addended by: Farley Ly on: 01/14/2017 01:51 PM   Modules accepted: Orders

## 2017-01-14 NOTE — Progress Notes (Signed)
[name] presents today for injection per the provider's orders.  Aranesp administration without incident; see MAR for injection details.  Patient tolerated procedure well and without incident.  No questions or complaints noted at this time.

## 2017-01-14 NOTE — Patient Instructions (Signed)
[place] at [facility] Discharge Instructions  RECOMMENDATIONS MADE BY THE CONSULTANT AND ANY TEST RESULTS WILL BE SENT TO YOUR REFERRING PHYSICIAN.  You were seen today by Dr. Twana First Follow up in 4 months with lab work See Amy up front for appointments   Thank you for choosing [place] at [facility] to provide your oncology and hematology care.  To afford each patient quality time with our provider, please arrive at least 15 minutes before your scheduled appointment time.    If you have a lab appointment with the [place] please come in thru the  Main Entrance and check in at the main information desk  You need to re-schedule your appointment should you arrive 10 or more minutes late.  We strive to give you quality time with our providers, and arriving late affects you and other patients whose appointments are after yours.  Also, if you no show three or more times for appointments you may be dismissed from the clinic at the providers discretion.     Again, thank you for choosing [facility].  Our hope is that these requests will decrease the amount of time that you wait before being seen by our physicians.       _____________________________________________________________  Should you have questions after your visit to [facility], please contact our office at [phone] between the hours of 8:30 a.m. and 4:30 p.m.  Voicemails left after 4:30 p.m. will not be returned until the following business day.  For prescription refill requests, have your pharmacy contact our office.       Resources For Cancer Patients and their Caregivers ? American Cancer Society: Can assist with transportation, wigs, general needs, runs Look Good Feel Better.        501-236-3137 ? Cancer Care: Provides financial assistance, online support groups, medication/co-pay assistance.  1-800-813-HOPE 225 059 3968) ? [place] Assists [place] cancer patients and their families through emotional , educational and financial support.  (907) 208-7074 ? [place] DSS Where to apply for food stamps, Medicaid and utility assistance. 615-169-2719 ? RCATS: Transportation to medical appointments. 240-357-0325 ? Social Security Administration: May apply for disability if have a Stage IV cancer. 878-423-4355 418-477-3329 ? [employer], Disability and Transit Services: Assists with nutrition, care and transit needs. [place] Support Programs: @10RELATIVEDAYS @ > Cancer Support Group  2nd Tuesday of the month 1pm-2pm, Journey Room  > Creative Journey  3rd Tuesday of the month 1130am-1pm, Journey Room  > Look Good Feel Better  1st Wednesday of the month 10am-12 noon, Journey Room (Call [place] to register [phone])

## 2017-01-16 ENCOUNTER — Encounter: Payer: Self-pay | Admitting: "Endocrinology

## 2017-01-16 ENCOUNTER — Ambulatory Visit (INDEPENDENT_AMBULATORY_CARE_PROVIDER_SITE_OTHER): Payer: Medicare Other | Admitting: "Endocrinology

## 2017-01-16 VITALS — BP 138/76 | HR 81 | Ht 65.0 in | Wt 212.0 lb

## 2017-01-16 DIAGNOSIS — E1122 Type 2 diabetes mellitus with diabetic chronic kidney disease: Secondary | ICD-10-CM

## 2017-01-16 DIAGNOSIS — I10 Essential (primary) hypertension: Secondary | ICD-10-CM

## 2017-01-16 DIAGNOSIS — N184 Chronic kidney disease, stage 4 (severe): Secondary | ICD-10-CM | POA: Diagnosis not present

## 2017-01-16 DIAGNOSIS — E782 Mixed hyperlipidemia: Secondary | ICD-10-CM | POA: Diagnosis not present

## 2017-01-16 DIAGNOSIS — Z794 Long term (current) use of insulin: Secondary | ICD-10-CM | POA: Diagnosis not present

## 2017-01-16 MED ORDER — INSULIN DETEMIR 100 UNIT/ML FLEXPEN: PEN_INJECTOR | SUBCUTANEOUS | 2 refills | Status: DC

## 2017-01-16 NOTE — Progress Notes (Signed)
Subjective:    Patient ID: [name], female    DOB: January 16, 1946,    Past Medical History:  Diagnosis Date  . Anemia   . Asthma   . Atrial flutter [facility])    TEE/DCCV December 2013 - on Xarelto [facility])  . Bone spur of toe    Right 5th toe  . CKD (chronic Little disease) stage 3, GFR 30-59 ml/min   . DVT of upper extremity (deep vein thrombosis) ([place])    Right basilic vein, June 1245  . Essential hypertension, benign   . SLE (systemic lupus erythematosus) ([place])   . Type 2 diabetes mellitus ([place])   . UTI (lower urinary tract infection)    Past Surgical History:  Procedure Laterality Date  . ABDOMINAL HYSTERECTOMY  1983  . ANKLE SURGERY  1993  . AV FISTULA PLACEMENT Left 03/27/2014   Procedure: ARTERIOVENOUS (AV) FISTULA CREATION;  Surgeon: Rosetta Posner, MD;  Location: [place];  Service: Vascular;  Laterality: Left;  . BACK SURGERY  1980  . CARDIOVASCULAR STRESS TEST  12/19/2009   no stress induced rhythm abnormalities, ekg negative for ischemia  . CARDIOVERSION  09/16/2012   Procedure: CARDIOVERSION;  Surgeon: Sanda Klein, MD;  Location: [facility] ENDOSCOPY;  Service: Cardiovascular;  Laterality: N/A;  . COLONOSCOPY  09/20/2012   Dr. [name]: multiple tubular adenomas   . COLONOSCOPY  2005   Dr. [name]. Polyps, path unknown   . COLONOSCOPY N/A 07/24/2016   Procedure: COLONOSCOPY;  Surgeon: [name], MD;  [facility];  Service: Endoscopy;  Laterality: N/A;  9:00 am  . ESOPHAGOGASTRODUODENOSCOPY  09/20/2012   Dr. [name]: duodenal erosion and possible resolving ulcer at the angularis   . TEE WITHOUT CARDIOVERSION  09/16/2012   Procedure: TRANSESOPHAGEAL ECHOCARDIOGRAM (TEE);  Surgeon: Sanda Klein, MD;  Location: [facility] ENDOSCOPY;  Service: Cardiovascular;  Laterality: N/A;  . TRANSESOPHAGEAL ECHOCARDIOGRAM WITH CARDIOVERSION  09/16/2012   EF 60-65%, moderate LVH, moderate regurg of the aortic valve, LA moderately dilated   Social History   Social History   . Marital status: Married    Spouse name: N/A  . Number of children: N/A  . Years of education: N/A   Social History Main Topics  . Smoking status: Former Smoker    Packs/day: 1.00    Years: 30.00    Types: Cigarettes    Quit date: 08/29/2012  . Smokeless tobacco: Never Used     Comment: quit 08-2012  . Alcohol use No  . Drug use: No  . Sexual activity: Yes    Partners: Male   Other Topics Concern  . None   Social History Narrative  . None   Outpatient Encounter Prescriptions as of 01/16/2017  Medication Sig  . acetaminophen (TYLENOL) 500 MG tablet Take 1,000 mg by mouth every 6 (six) hours as needed. For pain  . albuterol (ACCUNEB) 0.63 MG/3ML nebulizer solution Take 1 ampule by nebulization every 6 (six) hours as needed for wheezing or shortness of breath.   Marland Kitchen albuterol (PROVENTIL HFA;VENTOLIN HFA) 108 (90 BASE) MCG/ACT inhaler Inhale 2 puffs into the lungs every 6 (six) hours as needed. For shortness of breath  . amLODipine (NORVASC) 10 MG tablet Take 1 tablet (10 mg total) by mouth at bedtime.  Marland Kitchen atorvastatin (LIPITOR) 40 MG tablet Take 20 mg by mouth at bedtime.   . brimonidine-timolol (COMBIGAN) 0.2-0.5 % ophthalmic solution Place 1 drop into the left eye daily.   . calcitRIOL (ROCALTROL) 0.25 MCG capsule Take  0.25 mcg by mouth as directed. Take on Mondays Wednesdays and Fridays  . diclofenac (VOLTAREN) 0.1 % ophthalmic solution Place 1 drop into the left eye daily. Uses at lunch time  . febuxostat (ULORIC) 40 MG tablet Take 40 mg by mouth daily.  . folic acid (FOLVITE) 1 MG tablet Take 1 mg by mouth daily.  . furosemide (LASIX) 80 MG tablet Take 160 mg by mouth 2 (two) times daily.   Marland Kitchen HUMALOG KWIKPEN 100 UNIT/ML KiwkPen INJECT UP TO 11 UNITS SUBCUTANEOUSLY BEFORE MEALS THREE TIMES DAILY.  Marland Kitchen HYDROcodone-acetaminophen (NORCO/VICODIN) 5-325 MG per tablet Take 1-2 tablets by mouth every 4 (four) hours as needed for moderate pain.  . hydroxychloroquine (PLAQUENIL) 200 MG  tablet Take 200 mg by mouth as directed. Take 1 1/2 tablets daily  . Insulin Detemir (LEVEMIR FLEXTOUCH) 100 UNIT/ML Pen INJECT 10 UNITS SUBCUTANEOUSLY AT BEDTIME.  . metoprolol (LOPRESSOR) 50 MG tablet TAKE ONE TABLET BY MOUTH TWICE DAILY.  Marland Kitchen Misc Natural Products (TART CHERRY ADVANCED) CAPS Take 1 capsule by mouth 2 (two) times daily.  . Multiple Vitamins-Minerals (CENTRUM SILVER PO) Take 1 tablet by mouth daily.  . ONE TOUCH ULTRA TEST test strip TEST 4 TIMES A DAY AS DIRECTED.  Marland Kitchen ONETOUCH DELICA LANCETS 73U MISC USE TO TEST 4 TIMES DAILY.  Marland Kitchen predniSONE (DELTASONE) 5 MG tablet Take 5 mg by mouth daily with breakfast.  . travoprost, benzalkonium, (TRAVATAN) 0.004 % ophthalmic solution Place 1 drop into the left eye at bedtime.  Marland Kitchen UNIFINE PENTIPS 31G X 6 MM MISC USE AS DIRECTED AT BEDTIME WITH LEVEMIR AND WITH SLIDING SCALE INSULIN.  Marland Kitchen Vitamin D, Ergocalciferol, (DRISDOL) 50000 units CAPS capsule TAKE 1 CAPSULE BY MOUTH ONCE WEEKLY.  . warfarin (COUMADIN) 5 MG tablet Take 1 tablet daily except 1 1/2 tablets on Mondays, Wednesdays and Fridays  . [DISCONTINUED] LEVEMIR FLEXTOUCH 100 UNIT/ML Pen INJECT 12 UNITS SUBCUTANEOUSLY AT BEDTIME.   No facility-administered encounter medications on file as of 01/16/2017.    ALLERGIES: Allergies  Allergen Reactions  . Ace Inhibitors Cough   VACCINATION STATUS: Immunization History  Administered Date(s) Administered  . Influenza,inj,Quad PF,36+ Mos 07/03/2014  . Influenza-Unspecified 07/11/2015, 06/27/2016    Diabetes  She presents for her follow-up diabetic visit. She has type 2 diabetes mellitus. Onset time: diagnosed at approx age of 89. Her disease course has been improving. Pertinent negatives for hypoglycemia include no confusion, headaches, pallor or seizures. Associated symptoms include fatigue. Pertinent negatives for diabetes include no chest pain, no polydipsia, no polyphagia and no polyuria. Symptoms are improving. Diabetic complications  include nephropathy. Risk factors for coronary artery disease include dyslipidemia, diabetes mellitus and obesity. She is compliant with treatment most of the time. Her weight is stable. She is following a generally unhealthy diet. She has had a previous visit with a dietitian. She never participates in exercise. Her home blood glucose trend is decreasing steadily (Her A1c has improved to 6%, no hypoglycemia. Her EAG is between 100- 150.). Her breakfast blood glucose range is generally 130-140 mg/dl. Her lunch blood glucose range is generally 130-140 mg/dl. Her dinner blood glucose range is generally 130-140 mg/dl. Her overall blood glucose range is 130-140 mg/dl. An ACE inhibitor/angiotensin II receptor blocker is being taken.  Hypertension  Pertinent negatives include no chest pain, headaches, palpitations or shortness of breath.  Hyperlipidemia  Pertinent negatives include no chest pain, myalgias or shortness of breath. Current antihyperlipidemic treatment includes statins.     Review of Systems  Constitutional: Positive  for fatigue. Negative for unexpected weight change.  HENT: Negative for trouble swallowing and voice change.   Eyes: Negative for visual disturbance.  Respiratory: Negative for cough, shortness of breath and wheezing.   Cardiovascular: Negative for chest pain, palpitations and leg swelling.  Gastrointestinal: Negative for diarrhea, nausea and vomiting.  Endocrine: Negative for cold intolerance, heat intolerance, polydipsia, polyphagia and polyuria.  Musculoskeletal: Negative for arthralgias and myalgias.  Skin: Negative for color change, pallor, rash and wound.  Neurological: Negative for seizures and headaches.  Psychiatric/Behavioral: Negative for confusion and suicidal ideas.    Objective:    BP 138/76   Pulse 81   Ht 5\' 5"  (1.651 m)   Wt 212 lb (96.2 kg)   BMI 35.28 kg/m   Wt Readings from Last 3 Encounters:  01/16/17 212 lb (96.2 kg)  01/14/17 212 lb 12.8 oz  (96.5 kg)  12/01/16 212 lb (96.2 kg)    Physical Exam  Constitutional: She is oriented to person, place, and time. She appears well-developed.  HENT:  Head: Normocephalic and atraumatic.  Eyes: EOM are normal.  Neck: Normal range of motion. Neck supple. No tracheal deviation present. No thyromegaly present.  Cardiovascular: Normal rate and regular rhythm.   Pulmonary/Chest: Effort normal and breath sounds normal.  Abdominal: Soft. Bowel sounds are normal. There is no tenderness. There is no guarding.  Musculoskeletal: Normal range of motion. She exhibits no edema.  Neurological: She is alert and oriented to person, place, and time. She has normal reflexes. No cranial nerve deficit. Coordination normal.  Skin: Skin is warm and dry. No rash noted. No erythema. No pallor.  Psychiatric: She has a normal mood and affect. Judgment normal.    Chemistry (most recent): Lab Results  Component Value Date   NA 144 01/09/2017   K 4.0 01/09/2017   CL 103 01/09/2017   CO2 25 01/09/2017   BUN 77 (HH) 01/09/2017   CREATININE 2.39 (H) 01/09/2017   Diabetic Labs (most recent): Lab Results  Component Value Date   HGBA1C 5.8 (H) 01/09/2017   HGBA1C 5.7 (H) 10/17/2016   HGBA1C 5.7 (H) 07/10/2016     Assessment & Plan:   1. Type 2 diabetes mellitus with stage 4 chronic Little disease Patient is advised to stick to a routine mealtimes to eat 3 meals  a day and avoid unnecessary snacks ( to snack only to correct hypoglycemia). -Her A1c is 5.8%. - She has history of iron deficiency anemia, Getting iron infusion at [facility]. Patient is advised to eliminate simple carbs  from their diet including cakes desserts ice cream soda (  diet and regular) , sweet tea , Candies,  chips and cookies, artificial sweeteners,   and "sugar-free" products .  This will help patient to have stable blood glucose profile and potentially lose weight. Patient is given detailed personalized glucose monitoring and  insulin dosing instructions. Patient is instructed to call back with extremes of blood glucose less than 70 or greater than 300. Patient to bring meter and  blood glucose logs during their next visit.  I will lower Levemir to 10 units qhs, continue humalog 5 units TIDAC plus correction. -She has a chance to come off of Humalog if she is willing to avoid concentrated carbohydrates such as soda. - She is advised to hold off insulin treatment during her preparation for colonoscopy with adequate diet. She will call clinic if she registers greater than 300 mg/dL.  2. Essential hypertension, benign Controlled. Continue ARB.  3.  Hyperlipemia Continue atorvastatatin.   4. Morbid obesity Carbs advise given. See above #1. 5. vitamin D deficiency.  -Continue current Vitamin D supplement.   Follow up plan: Return in about 3 months (around 04/17/2017) for follow up with pre-visit labs, meter, and logs.  Glade Lloyd, MD Phone: [phone]  Fax: [phone]   01/16/2017, 11:17 AM

## 2017-01-16 NOTE — Patient Instructions (Signed)

## 2017-01-19 ENCOUNTER — Telehealth: Payer: Self-pay | Admitting: "Endocrinology

## 2017-01-19 NOTE — Telephone Encounter (Signed)
[name] is calling on behalf of [name] they have questions about her sliding scale insulin, please advise

## 2017-01-19 NOTE — Telephone Encounter (Signed)
Went over insulin dosage in office

## 2017-01-20 DIAGNOSIS — E1151 Type 2 diabetes mellitus with diabetic peripheral angiopathy without gangrene: Secondary | ICD-10-CM | POA: Diagnosis not present

## 2017-01-20 DIAGNOSIS — E1140 Type 2 diabetes mellitus with diabetic neuropathy, unspecified: Secondary | ICD-10-CM | POA: Diagnosis not present

## 2017-01-20 DIAGNOSIS — L89892 Pressure ulcer of other site, stage 2: Secondary | ICD-10-CM | POA: Diagnosis not present

## 2017-01-23 ENCOUNTER — Other Ambulatory Visit: Payer: Self-pay | Admitting: Cardiology

## 2017-01-26 ENCOUNTER — Ambulatory Visit (INDEPENDENT_AMBULATORY_CARE_PROVIDER_SITE_OTHER): Payer: Medicare Other | Admitting: *Deleted

## 2017-01-26 DIAGNOSIS — I4892 Unspecified atrial flutter: Secondary | ICD-10-CM

## 2017-01-26 DIAGNOSIS — Z5181 Encounter for therapeutic drug level monitoring: Secondary | ICD-10-CM

## 2017-01-26 LAB — POCT INR: INR: 1.7

## 2017-01-26 MED ORDER — WARFARIN SODIUM 5 MG PO TABS: ORAL_TABLET | ORAL | 3 refills | Status: DC

## 2017-01-28 ENCOUNTER — Encounter (HOSPITAL_COMMUNITY): Payer: Medicare Other

## 2017-01-28 ENCOUNTER — Encounter (HOSPITAL_COMMUNITY): Payer: Medicare Other | Attending: Oncology

## 2017-01-28 ENCOUNTER — Encounter (HOSPITAL_COMMUNITY): Payer: Self-pay

## 2017-01-28 VITALS — BP 140/41 | HR 72 | Temp 97.7°F | Resp 18

## 2017-01-28 DIAGNOSIS — N058 Unspecified nephritic syndrome with other morphologic changes: Secondary | ICD-10-CM

## 2017-01-28 DIAGNOSIS — N184 Chronic kidney disease, stage 4 (severe): Secondary | ICD-10-CM | POA: Diagnosis not present

## 2017-01-28 DIAGNOSIS — D631 Anemia in chronic kidney disease: Secondary | ICD-10-CM

## 2017-01-28 DIAGNOSIS — N189 Chronic kidney disease, unspecified: Secondary | ICD-10-CM | POA: Diagnosis not present

## 2017-01-28 LAB — CBC WITH DIFFERENTIAL/PLATELET
BASOS ABS: 0 10*3/uL (ref 0.0–0.1)
Basophils Relative: 0 %
Eosinophils Absolute: 0.1 10*3/uL (ref 0.0–0.7)
Eosinophils Relative: 2 %
HEMATOCRIT: 30.9 % — AB (ref 36.0–46.0)
Hemoglobin: 9.4 g/dL — ABNORMAL LOW (ref 12.0–15.0)
LYMPHS PCT: 21 %
Lymphs Abs: 1.2 10*3/uL (ref 0.7–4.0)
MCH: 26 pg (ref 26.0–34.0)
MCHC: 30.4 g/dL (ref 30.0–36.0)
MCV: 85.6 fL (ref 78.0–100.0)
Monocytes Absolute: 0.3 10*3/uL (ref 0.1–1.0)
Monocytes Relative: 6 %
Neutro Abs: 3.8 10*3/uL (ref 1.7–7.7)
Neutrophils Relative %: 71 %
Platelets: 246 10*3/uL (ref 150–400)
RBC: 3.61 MIL/uL — AB (ref 3.87–5.11)
RDW: 17 % — ABNORMAL HIGH (ref 11.5–15.5)
WBC: 5.5 10*3/uL (ref 4.0–10.5)

## 2017-01-28 MED ORDER — DARBEPOETIN ALFA 150 MCG/0.3ML IJ SOSY
125.0000 ug | PREFILLED_SYRINGE | Freq: Once | INTRAMUSCULAR | Status: AC
  Administered 2017-01-28: 125 ug via SUBCUTANEOUS

## 2017-01-28 MED ORDER — DARBEPOETIN ALFA 150 MCG/0.3ML IJ SOSY
PREFILLED_SYRINGE | INTRAMUSCULAR | Status: AC
  Filled 2017-01-28: qty 0.3

## 2017-01-28 NOTE — Progress Notes (Signed)
[name] tolerated Aranesp injection well without complaints or incident. Hgb 9.4 today. VSS Pt discharged via wheelchair in satisfactory condition accompanied by her husband

## 2017-01-28 NOTE — Patient Instructions (Signed)
[facility] at [facility] Discharge Instructions  RECOMMENDATIONS MADE BY THE CONSULTANT AND ANY TEST RESULTS WILL BE SENT TO YOUR REFERRING PHYSICIAN.  Received Aranesp injection today. Follow-up as scheduled. Call clinic for any questions or concerns  Thank you for choosing [facility] at [facility] to provide your oncology and hematology care.  To afford each patient quality time with our provider, please arrive at least 15 minutes before your scheduled appointment time.    If you have a lab appointment with the Cancer Center please come in thru the  Main Entrance and check in at the main information desk  You need to re-schedule your appointment should you arrive 10 or more minutes late.  We strive to give you quality time with our providers, and arriving late affects you and other patients whose appointments are after yours.  Also, if you no show three or more times for appointments you may be dismissed from the clinic at the providers discretion.     Again, thank you for choosing [facility].  Our hope is that these requests will decrease the amount of time that you wait before being seen by our physicians.       _____________________________________________________________  Should you have questions after your visit to [facility], please contact our office at [phone] between the hours of 8:30 a.m. and 4:30 p.m.  Voicemails left after 4:30 p.m. will not be returned until the following business day.  For prescription refill requests, have your pharmacy contact our office.       Resources For Cancer Patients and their Caregivers ? American Cancer Society: Can assist with transportation, wigs, general needs, runs Look Good Feel Better.        1-888-227-6333 ? Cancer Care: Provides financial assistance, online support groups, medication/co-pay assistance.  1-800-813-HOPE (4673) ? [facility] Assists [place] cancer patients and their families through emotional , educational and financial support.  336-427-4357 ? [place] DSS Where to apply for food stamps, Medicaid and utility assistance. 336-342-1394 ? [employer]: Transportation to medical appointments. 336-347-2287 ? Social Security Administration: May apply for disability if have a Stage IV cancer. 336-342-7796 1-800-772-1213 ? [place] Aging, Disability and Transit Services: Assists with nutrition, care and transit needs. 336-349-2343  Cancer Center Support Programs: @10RELATIVEDAYS@ > Cancer Support Group  2nd Tuesday of the month 1pm-2pm, Journey Room  > Creative Journey  3rd Tuesday of the month 1130am-1pm, Journey Room  > Look Good Feel Better  1st Wednesday of the month 10am-12 noon, Journey Room (Call [employer] to register 1-800-395-5775)   

## 2017-02-02 DIAGNOSIS — M329 Systemic lupus erythematosus, unspecified: Secondary | ICD-10-CM | POA: Diagnosis not present

## 2017-02-02 DIAGNOSIS — N189 Chronic kidney disease, unspecified: Secondary | ICD-10-CM | POA: Diagnosis not present

## 2017-02-02 DIAGNOSIS — Z7952 Long term (current) use of systemic steroids: Secondary | ICD-10-CM | POA: Diagnosis not present

## 2017-02-02 DIAGNOSIS — M150 Primary generalized (osteo)arthritis: Secondary | ICD-10-CM | POA: Diagnosis not present

## 2017-02-02 DIAGNOSIS — N390 Urinary tract infection, site not specified: Secondary | ICD-10-CM | POA: Diagnosis not present

## 2017-02-11 ENCOUNTER — Encounter (HOSPITAL_COMMUNITY): Payer: Medicare Other

## 2017-02-11 ENCOUNTER — Encounter (HOSPITAL_BASED_OUTPATIENT_CLINIC_OR_DEPARTMENT_OTHER): Payer: Medicare Other

## 2017-02-11 ENCOUNTER — Encounter (HOSPITAL_COMMUNITY): Payer: Self-pay

## 2017-02-11 VITALS — BP 157/81 | HR 59 | Temp 97.7°F | Resp 18

## 2017-02-11 DIAGNOSIS — N184 Chronic kidney disease, stage 4 (severe): Secondary | ICD-10-CM | POA: Diagnosis not present

## 2017-02-11 DIAGNOSIS — N058 Unspecified nephritic syndrome with other morphologic changes: Secondary | ICD-10-CM

## 2017-02-11 DIAGNOSIS — N189 Chronic kidney disease, unspecified: Secondary | ICD-10-CM | POA: Diagnosis not present

## 2017-02-11 DIAGNOSIS — D631 Anemia in chronic kidney disease: Secondary | ICD-10-CM

## 2017-02-11 LAB — CBC WITH DIFFERENTIAL/PLATELET
Basophils Absolute: 0 10*3/uL (ref 0.0–0.1)
Basophils Relative: 0 %
EOS ABS: 0.1 10*3/uL (ref 0.0–0.7)
EOS PCT: 2 %
HCT: 29.5 % — ABNORMAL LOW (ref 36.0–46.0)
HEMOGLOBIN: 9.2 g/dL — AB (ref 12.0–15.0)
LYMPHS PCT: 13 %
Lymphs Abs: 1 10*3/uL (ref 0.7–4.0)
MCH: 26.2 pg (ref 26.0–34.0)
MCHC: 31.2 g/dL (ref 30.0–36.0)
MCV: 84 fL (ref 78.0–100.0)
MONOS PCT: 8 %
Monocytes Absolute: 0.6 10*3/uL (ref 0.1–1.0)
NEUTROS PCT: 77 %
Neutro Abs: 5.4 10*3/uL (ref 1.7–7.7)
Platelets: 262 10*3/uL (ref 150–400)
RBC: 3.51 MIL/uL — AB (ref 3.87–5.11)
RDW: 16.7 % — ABNORMAL HIGH (ref 11.5–15.5)
WBC: 7.1 10*3/uL (ref 4.0–10.5)

## 2017-02-11 MED ORDER — DARBEPOETIN ALFA 150 MCG/0.3ML IJ SOSY
125.0000 ug | PREFILLED_SYRINGE | Freq: Once | INTRAMUSCULAR | Status: AC
  Administered 2017-02-11: 125 ug via SUBCUTANEOUS
  Filled 2017-02-11: qty 0.3

## 2017-02-11 NOTE — Progress Notes (Signed)
[name] tolerated Aranesp injection well without complaints or incident. Hgb 9.2 reviewed prior to administering injection.VSS Pt discharged via wheelchair in satisfactory condition accompanied by her husband

## 2017-02-11 NOTE — Patient Instructions (Signed)
[facility] at [facility] Discharge Instructions  RECOMMENDATIONS MADE BY THE CONSULTANT AND ANY TEST RESULTS WILL BE SENT TO YOUR REFERRING PHYSICIAN.  Received Aranesp injection today. Follow-up as scheduled. Call clinic for any questions or concerns  Thank you for choosing [facility] at [facility] to provide your oncology and hematology care.  To afford each patient quality time with our provider, please arrive at least 15 minutes before your scheduled appointment time.    If you have a lab appointment with the Cancer Center please come in thru the  Main Entrance and check in at the main information desk  You need to re-schedule your appointment should you arrive 10 or more minutes late.  We strive to give you quality time with our providers, and arriving late affects you and other patients whose appointments are after yours.  Also, if you no show three or more times for appointments you may be dismissed from the clinic at the providers discretion.     Again, thank you for choosing [facility].  Our hope is that these requests will decrease the amount of time that you wait before being seen by our physicians.       _____________________________________________________________  Should you have questions after your visit to [facility], please contact our office at [phone] between the hours of 8:30 a.m. and 4:30 p.m.  Voicemails left after 4:30 p.m. will not be returned until the following business day.  For prescription refill requests, have your pharmacy contact our office.       Resources For Cancer Patients and their Caregivers ? American Cancer Society: Can assist with transportation, wigs, general needs, runs Look Good Feel Better.        1-888-227-6333 ? Cancer Care: Provides financial assistance, online support groups, medication/co-pay assistance.  1-800-813-HOPE (4673) ? [facility] Assists [place] cancer patients and their families through emotional , educational and financial support.  336-427-4357 ? [place] DSS Where to apply for food stamps, Medicaid and utility assistance. 336-342-1394 ? RCATS: Transportation to medical appointments. 336-347-2287 ? Social Security Administration: May apply for disability if have a Stage IV cancer. 336-342-7796 1-800-772-1213 ? [place] Aging, Disability and Transit Services: Assists with nutrition, care and transit needs. 336-349-2343  Cancer Center Support Programs: @10RELATIVEDAYS@ > Cancer Support Group  2nd Tuesday of the month 1pm-2pm, Journey Room  > Creative Journey  3rd Tuesday of the month 1130am-1pm, Journey Room  > Look Good Feel Better  1st Wednesday of the month 10am-12 noon, Journey Room (Call [employer] to register 1-800-395-5775)   

## 2017-02-16 ENCOUNTER — Ambulatory Visit (INDEPENDENT_AMBULATORY_CARE_PROVIDER_SITE_OTHER): Payer: Medicare Other | Admitting: *Deleted

## 2017-02-16 DIAGNOSIS — Z5181 Encounter for therapeutic drug level monitoring: Secondary | ICD-10-CM

## 2017-02-16 DIAGNOSIS — I4892 Unspecified atrial flutter: Secondary | ICD-10-CM | POA: Diagnosis not present

## 2017-02-16 LAB — POCT INR: INR: 3.4

## 2017-02-17 DIAGNOSIS — E1151 Type 2 diabetes mellitus with diabetic peripheral angiopathy without gangrene: Secondary | ICD-10-CM | POA: Diagnosis not present

## 2017-02-17 DIAGNOSIS — L89892 Pressure ulcer of other site, stage 2: Secondary | ICD-10-CM | POA: Diagnosis not present

## 2017-02-17 DIAGNOSIS — E1140 Type 2 diabetes mellitus with diabetic neuropathy, unspecified: Secondary | ICD-10-CM | POA: Diagnosis not present

## 2017-02-25 ENCOUNTER — Encounter (HOSPITAL_COMMUNITY): Payer: Self-pay

## 2017-02-25 ENCOUNTER — Encounter (HOSPITAL_BASED_OUTPATIENT_CLINIC_OR_DEPARTMENT_OTHER): Payer: Medicare Other

## 2017-02-25 ENCOUNTER — Encounter (HOSPITAL_COMMUNITY): Payer: Medicare Other

## 2017-02-25 VITALS — BP 125/57 | HR 64 | Temp 97.5°F | Resp 18

## 2017-02-25 DIAGNOSIS — N184 Chronic kidney disease, stage 4 (severe): Principal | ICD-10-CM

## 2017-02-25 DIAGNOSIS — N189 Chronic kidney disease, unspecified: Secondary | ICD-10-CM | POA: Diagnosis not present

## 2017-02-25 DIAGNOSIS — D631 Anemia in chronic kidney disease: Secondary | ICD-10-CM

## 2017-02-25 DIAGNOSIS — N058 Unspecified nephritic syndrome with other morphologic changes: Secondary | ICD-10-CM

## 2017-02-25 LAB — CBC WITH DIFFERENTIAL/PLATELET
BASOS PCT: 0 %
Basophils Absolute: 0 10*3/uL (ref 0.0–0.1)
EOS ABS: 0.2 10*3/uL (ref 0.0–0.7)
EOS PCT: 3 %
HEMATOCRIT: 29.3 % — AB (ref 36.0–46.0)
Hemoglobin: 8.9 g/dL — ABNORMAL LOW (ref 12.0–15.0)
Lymphocytes Relative: 19 %
Lymphs Abs: 1 10*3/uL (ref 0.7–4.0)
MCH: 25.6 pg — ABNORMAL LOW (ref 26.0–34.0)
MCHC: 30.4 g/dL (ref 30.0–36.0)
MCV: 84.4 fL (ref 78.0–100.0)
MONO ABS: 0.4 10*3/uL (ref 0.1–1.0)
MONOS PCT: 7 %
NEUTROS ABS: 4 10*3/uL (ref 1.7–7.7)
Neutrophils Relative %: 71 %
PLATELETS: 236 10*3/uL (ref 150–400)
RBC: 3.47 MIL/uL — ABNORMAL LOW (ref 3.87–5.11)
RDW: 17 % — ABNORMAL HIGH (ref 11.5–15.5)
WBC: 5.5 10*3/uL (ref 4.0–10.5)

## 2017-02-25 MED ORDER — DARBEPOETIN ALFA 150 MCG/0.3ML IJ SOSY
125.0000 ug | PREFILLED_SYRINGE | Freq: Once | INTRAMUSCULAR | Status: AC
  Administered 2017-02-25: 125 ug via SUBCUTANEOUS
  Filled 2017-02-25: qty 0.3

## 2017-02-25 NOTE — Progress Notes (Signed)
[name] tolerated Aranesp injection well without complaints or incident. Labs reviewed prior to administering this injection. VSS Pt discharged via wheelchair in satisfactory condition accompanied by her husband

## 2017-02-25 NOTE — Patient Instructions (Signed)
[facility] at [facility] Discharge Instructions  RECOMMENDATIONS MADE BY THE CONSULTANT AND ANY TEST RESULTS WILL BE SENT TO YOUR REFERRING PHYSICIAN.  Received Aranesp injection today. Follow-up as scheduled. Call clinic for any questions or concerns  Thank you for choosing [facility] at [facility] to provide your oncology and hematology care.  To afford each patient quality time with our provider, please arrive at least 15 minutes before your scheduled appointment time.    If you have a lab appointment with the Cancer Center please come in thru the  Main Entrance and check in at the main information desk  You need to re-schedule your appointment should you arrive 10 or more minutes late.  We strive to give you quality time with our providers, and arriving late affects you and other patients whose appointments are after yours.  Also, if you no show three or more times for appointments you may be dismissed from the clinic at the providers discretion.     Again, thank you for choosing [facility].  Our hope is that these requests will decrease the amount of time that you wait before being seen by our physicians.       _____________________________________________________________  Should you have questions after your visit to [facility], please contact our office at [phone] between the hours of 8:30 a.m. and 4:30 p.m.  Voicemails left after 4:30 p.m. will not be returned until the following business day.  For prescription refill requests, have your pharmacy contact our office.       Resources For Cancer Patients and their Caregivers ? American Cancer Society: Can assist with transportation, wigs, general needs, runs Look Good Feel Better.        1-888-227-6333 ? Cancer Care: Provides financial assistance, online support groups, medication/co-pay assistance.  1-800-813-HOPE (4673) ? [facility] Assists [place] cancer patients and their families through emotional , educational and financial support.  336-427-4357 ? [place] DSS Where to apply for food stamps, Medicaid and utility assistance. 336-342-1394 ? [employer]ATS: Transportation to medical appointments. 336-347-2287 ? Social Security Administration: May apply for disability if have a Stage IV cancer. 336-342-7796 1-800-772-1213 ? [place] Aging, Disability and Transit Services: Assists with nutrition, care and transit needs. 336-349-2343  Cancer Center Support Programs: @10RELATIVEDAYS@ > Cancer Support Group  2nd Tuesday of the month 1pm-2pm, Journey Room  > Creative Journey  3rd Tuesday of the month 1130am-1pm, Journey Room  > Look Good Feel Better  1st Wednesday of the month 10am-12 noon, Journey Room (Call [employer] to register 1-800-395-5775)   

## 2017-03-02 DIAGNOSIS — I10 Essential (primary) hypertension: Secondary | ICD-10-CM | POA: Diagnosis not present

## 2017-03-02 DIAGNOSIS — I889 Nonspecific lymphadenitis, unspecified: Secondary | ICD-10-CM | POA: Diagnosis not present

## 2017-03-02 DIAGNOSIS — M26602 Left temporomandibular joint disorder, unspecified: Secondary | ICD-10-CM | POA: Diagnosis not present

## 2017-03-02 DIAGNOSIS — E119 Type 2 diabetes mellitus without complications: Secondary | ICD-10-CM | POA: Diagnosis not present

## 2017-03-02 DIAGNOSIS — Z1389 Encounter for screening for other disorder: Secondary | ICD-10-CM | POA: Diagnosis not present

## 2017-03-09 ENCOUNTER — Ambulatory Visit (INDEPENDENT_AMBULATORY_CARE_PROVIDER_SITE_OTHER): Payer: Medicare Other | Admitting: *Deleted

## 2017-03-09 DIAGNOSIS — I4892 Unspecified atrial flutter: Secondary | ICD-10-CM | POA: Diagnosis not present

## 2017-03-09 DIAGNOSIS — Z5181 Encounter for therapeutic drug level monitoring: Secondary | ICD-10-CM

## 2017-03-09 LAB — POCT INR: INR: 1.8

## 2017-03-11 ENCOUNTER — Encounter (HOSPITAL_COMMUNITY): Payer: Medicare Other

## 2017-03-11 ENCOUNTER — Encounter (HOSPITAL_COMMUNITY): Payer: Medicare Other | Attending: Oncology

## 2017-03-11 VITALS — BP 147/56 | HR 45 | Temp 97.9°F | Resp 20

## 2017-03-11 DIAGNOSIS — N189 Chronic kidney disease, unspecified: Secondary | ICD-10-CM

## 2017-03-11 DIAGNOSIS — D631 Anemia in chronic kidney disease: Secondary | ICD-10-CM | POA: Diagnosis not present

## 2017-03-11 DIAGNOSIS — N184 Chronic kidney disease, stage 4 (severe): Principal | ICD-10-CM

## 2017-03-11 DIAGNOSIS — N058 Unspecified nephritic syndrome with other morphologic changes: Secondary | ICD-10-CM

## 2017-03-11 LAB — CBC WITH DIFFERENTIAL/PLATELET
Basophils Absolute: 0 10*3/uL (ref 0.0–0.1)
Basophils Relative: 0 %
EOS PCT: 1 %
Eosinophils Absolute: 0.1 10*3/uL (ref 0.0–0.7)
HEMATOCRIT: 27.8 % — AB (ref 36.0–46.0)
Hemoglobin: 8.5 g/dL — ABNORMAL LOW (ref 12.0–15.0)
LYMPHS ABS: 1.1 10*3/uL (ref 0.7–4.0)
LYMPHS PCT: 14 %
MCH: 26.2 pg (ref 26.0–34.0)
MCHC: 30.6 g/dL (ref 30.0–36.0)
MCV: 85.5 fL (ref 78.0–100.0)
MONO ABS: 0.3 10*3/uL (ref 0.1–1.0)
Monocytes Relative: 4 %
NEUTROS ABS: 6.4 10*3/uL (ref 1.7–7.7)
Neutrophils Relative %: 81 %
Platelets: 233 10*3/uL (ref 150–400)
RBC: 3.25 MIL/uL — ABNORMAL LOW (ref 3.87–5.11)
RDW: 18 % — AB (ref 11.5–15.5)
WBC: 7.9 10*3/uL (ref 4.0–10.5)

## 2017-03-11 MED ORDER — DARBEPOETIN ALFA 150 MCG/0.3ML IJ SOSY
125.0000 ug | PREFILLED_SYRINGE | Freq: Once | INTRAMUSCULAR | Status: AC
  Administered 2017-03-11: 150 ug via SUBCUTANEOUS

## 2017-03-11 MED ORDER — DARBEPOETIN ALFA 150 MCG/0.3ML IJ SOSY
PREFILLED_SYRINGE | INTRAMUSCULAR | Status: AC
  Filled 2017-03-11: qty 0.3

## 2017-03-11 NOTE — Progress Notes (Signed)
[name] presents today for injection per MD orders. Aranesp 124mcg administered SQ in right Abdomen. Administration without incident. Patient tolerated well.

## 2017-03-12 DIAGNOSIS — Z961 Presence of intraocular lens: Secondary | ICD-10-CM | POA: Diagnosis not present

## 2017-03-12 DIAGNOSIS — H43821 Vitreomacular adhesion, right eye: Secondary | ICD-10-CM | POA: Diagnosis not present

## 2017-03-12 DIAGNOSIS — E113213 Type 2 diabetes mellitus with mild nonproliferative diabetic retinopathy with macular edema, bilateral: Secondary | ICD-10-CM | POA: Diagnosis not present

## 2017-03-16 DIAGNOSIS — N184 Chronic kidney disease, stage 4 (severe): Secondary | ICD-10-CM | POA: Diagnosis not present

## 2017-03-16 DIAGNOSIS — M329 Systemic lupus erythematosus, unspecified: Secondary | ICD-10-CM | POA: Diagnosis not present

## 2017-03-16 DIAGNOSIS — N2581 Secondary hyperparathyroidism of renal origin: Secondary | ICD-10-CM | POA: Diagnosis not present

## 2017-03-16 DIAGNOSIS — D631 Anemia in chronic kidney disease: Secondary | ICD-10-CM | POA: Diagnosis not present

## 2017-03-16 DIAGNOSIS — N179 Acute kidney failure, unspecified: Secondary | ICD-10-CM | POA: Diagnosis not present

## 2017-03-17 DIAGNOSIS — L89892 Pressure ulcer of other site, stage 2: Secondary | ICD-10-CM | POA: Diagnosis not present

## 2017-03-17 DIAGNOSIS — E1151 Type 2 diabetes mellitus with diabetic peripheral angiopathy without gangrene: Secondary | ICD-10-CM | POA: Diagnosis not present

## 2017-03-17 DIAGNOSIS — E1140 Type 2 diabetes mellitus with diabetic neuropathy, unspecified: Secondary | ICD-10-CM | POA: Diagnosis not present

## 2017-03-25 ENCOUNTER — Encounter (HOSPITAL_COMMUNITY): Payer: Medicare Other

## 2017-03-25 ENCOUNTER — Encounter (HOSPITAL_BASED_OUTPATIENT_CLINIC_OR_DEPARTMENT_OTHER): Payer: Medicare Other

## 2017-03-25 VITALS — BP 151/52 | HR 65 | Temp 97.4°F | Resp 18

## 2017-03-25 DIAGNOSIS — N185 Chronic kidney disease, stage 5: Secondary | ICD-10-CM

## 2017-03-25 DIAGNOSIS — D631 Anemia in chronic kidney disease: Secondary | ICD-10-CM | POA: Diagnosis not present

## 2017-03-25 DIAGNOSIS — N058 Unspecified nephritic syndrome with other morphologic changes: Secondary | ICD-10-CM

## 2017-03-25 DIAGNOSIS — N184 Chronic kidney disease, stage 4 (severe): Secondary | ICD-10-CM | POA: Diagnosis not present

## 2017-03-25 LAB — CBC WITH DIFFERENTIAL/PLATELET
BASOS ABS: 0 10*3/uL (ref 0.0–0.1)
BASOS PCT: 0 %
Eosinophils Absolute: 0.1 10*3/uL (ref 0.0–0.7)
Eosinophils Relative: 2 %
HEMATOCRIT: 30 % — AB (ref 36.0–46.0)
HEMOGLOBIN: 9 g/dL — AB (ref 12.0–15.0)
Lymphocytes Relative: 11 %
Lymphs Abs: 0.7 10*3/uL (ref 0.7–4.0)
MCH: 25.8 pg — ABNORMAL LOW (ref 26.0–34.0)
MCHC: 30 g/dL (ref 30.0–36.0)
MCV: 86 fL (ref 78.0–100.0)
Monocytes Absolute: 0.5 10*3/uL (ref 0.1–1.0)
Monocytes Relative: 8 %
NEUTROS ABS: 5.4 10*3/uL (ref 1.7–7.7)
NEUTROS PCT: 79 %
Platelets: 209 10*3/uL (ref 150–400)
RBC: 3.49 MIL/uL — ABNORMAL LOW (ref 3.87–5.11)
RDW: 18 % — ABNORMAL HIGH (ref 11.5–15.5)
WBC: 6.7 10*3/uL (ref 4.0–10.5)

## 2017-03-25 MED ORDER — DARBEPOETIN ALFA 150 MCG/0.3ML IJ SOSY
125.0000 ug | PREFILLED_SYRINGE | Freq: Once | INTRAMUSCULAR | Status: AC
  Administered 2017-03-25: 125 ug via SUBCUTANEOUS

## 2017-03-25 MED ORDER — DARBEPOETIN ALFA 150 MCG/0.3ML IJ SOSY
PREFILLED_SYRINGE | INTRAMUSCULAR | Status: AC
  Filled 2017-03-25: qty 0.3

## 2017-03-25 NOTE — Progress Notes (Signed)
[name] presents today for injection per MD orders. Aranesp 125 mcg administered SQ in left Abdomen. Administration without incident. Patient tolerated well. Stable on discharge home with husband via wheelchair.

## 2017-03-25 NOTE — Patient Instructions (Signed)
[place] at [facility] Discharge Instructions  RECOMMENDATIONS MADE BY THE CONSULTANT AND ANY TEST RESULTS WILL BE SENT TO YOUR REFERRING PHYSICIAN.  Hemoglobin 9.0 today. Aranesp 125 mcg injection given as ordered. Return as scheduled.  Thank you for choosing [place] at [facility] to provide your oncology and hematology care.  To afford each patient quality time with our provider, please arrive at least 15 minutes before your scheduled appointment time.    If you have a lab appointment with the [place] please come in thru the  Main Entrance and check in at the main information desk  You need to re-schedule your appointment should you arrive 10 or more minutes late.  We strive to give you quality time with our providers, and arriving late affects you and other patients whose appointments are after yours.  Also, if you no show three or more times for appointments you may be dismissed from the clinic at the providers discretion.     Again, thank you for choosing [facility].  Our hope is that these requests will decrease the amount of time that you wait before being seen by our physicians.       _____________________________________________________________  Should you have questions after your visit to [facility], please contact our office at [phone] between the hours of 8:30 a.m. and 4:30 p.m.  Voicemails left after 4:30 p.m. will not be returned until the following business day.  For prescription refill requests, have your pharmacy contact our office.       Resources For Cancer Patients and their Caregivers ? American Cancer Society: Can assist with transportation, wigs, general needs, runs Look Good Feel Better.        (905)467-9204 ? Cancer Care: Provides financial assistance, online support groups, medication/co-pay assistance.  1-800-813-HOPE 816 403 4199) ? [place] Assists [place] cancer patients and their families through emotional , educational and financial support.  6202661858 ? [place] DSS Where to apply for food stamps, Medicaid and utility assistance. (646) 427-6508 ? RCATS: Transportation to medical appointments. 5747494536 ? Social Security Administration: May apply for disability if have a Stage IV cancer. 336-001-1226 612-289-9032 ? [employer], Disability and Transit Services: Assists with nutrition, care and transit needs. [place] Support Programs: @10RELATIVEDAYS @ > Cancer Support Group  2nd Tuesday of the month 1pm-2pm, Journey Room  > Creative Journey  3rd Tuesday of the month 1130am-1pm, Journey Room  > Look Good Feel Better  1st Wednesday of the month 10am-12 noon, Journey Room (Call [place] to register [phone])

## 2017-03-30 ENCOUNTER — Ambulatory Visit (INDEPENDENT_AMBULATORY_CARE_PROVIDER_SITE_OTHER): Payer: Medicare Other | Admitting: *Deleted

## 2017-03-30 ENCOUNTER — Other Ambulatory Visit: Payer: Self-pay | Admitting: "Endocrinology

## 2017-03-30 DIAGNOSIS — I4892 Unspecified atrial flutter: Secondary | ICD-10-CM | POA: Diagnosis not present

## 2017-03-30 DIAGNOSIS — Z5181 Encounter for therapeutic drug level monitoring: Secondary | ICD-10-CM

## 2017-03-30 LAB — POCT INR: INR: 3

## 2017-04-08 ENCOUNTER — Encounter (HOSPITAL_COMMUNITY): Payer: Medicare Other

## 2017-04-08 ENCOUNTER — Encounter (HOSPITAL_COMMUNITY): Payer: Self-pay

## 2017-04-08 ENCOUNTER — Encounter (HOSPITAL_COMMUNITY): Payer: Medicare Other | Attending: Oncology

## 2017-04-08 VITALS — BP 110/90 | HR 60 | Temp 97.6°F | Resp 20

## 2017-04-08 DIAGNOSIS — N189 Chronic kidney disease, unspecified: Secondary | ICD-10-CM | POA: Diagnosis not present

## 2017-04-08 DIAGNOSIS — D631 Anemia in chronic kidney disease: Secondary | ICD-10-CM

## 2017-04-08 DIAGNOSIS — N184 Chronic kidney disease, stage 4 (severe): Secondary | ICD-10-CM | POA: Diagnosis not present

## 2017-04-08 DIAGNOSIS — N058 Unspecified nephritic syndrome with other morphologic changes: Secondary | ICD-10-CM

## 2017-04-08 LAB — CBC WITH DIFFERENTIAL/PLATELET
Basophils Absolute: 0 10*3/uL (ref 0.0–0.1)
Basophils Relative: 0 %
Eosinophils Absolute: 0.1 10*3/uL (ref 0.0–0.7)
Eosinophils Relative: 2 %
HEMATOCRIT: 27.8 % — AB (ref 36.0–46.0)
HEMOGLOBIN: 8.6 g/dL — AB (ref 12.0–15.0)
LYMPHS ABS: 0.8 10*3/uL (ref 0.7–4.0)
Lymphocytes Relative: 12 %
MCH: 26.6 pg (ref 26.0–34.0)
MCHC: 30.9 g/dL (ref 30.0–36.0)
MCV: 86.1 fL (ref 78.0–100.0)
MONOS PCT: 8 %
Monocytes Absolute: 0.6 10*3/uL (ref 0.1–1.0)
NEUTROS ABS: 5.2 10*3/uL (ref 1.7–7.7)
NEUTROS PCT: 78 %
Platelets: 236 10*3/uL (ref 150–400)
RBC: 3.23 MIL/uL — AB (ref 3.87–5.11)
RDW: 17.7 % — ABNORMAL HIGH (ref 11.5–15.5)
WBC: 6.7 10*3/uL (ref 4.0–10.5)

## 2017-04-08 MED ORDER — DARBEPOETIN ALFA 150 MCG/0.3ML IJ SOSY
125.0000 ug | PREFILLED_SYRINGE | Freq: Once | INTRAMUSCULAR | Status: AC
  Administered 2017-04-08: 125 ug via SUBCUTANEOUS
  Filled 2017-04-08: qty 0.3

## 2017-04-08 NOTE — Patient Instructions (Signed)
[place] at [facility] Discharge Instructions  RECOMMENDATIONS MADE BY THE CONSULTANT AND ANY TEST RESULTS WILL BE SENT TO YOUR REFERRING PHYSICIAN.  You received your aranesp injection today. Follow up in 2 weeks with labs and injection.  Thank you for choosing [place] at [facility] to provide your oncology and hematology care.  To afford each patient quality time with our provider, please arrive at least 15 minutes before your scheduled appointment time.    If you have a lab appointment with the [place] please come in thru the  Main Entrance and check in at the main information desk  You need to re-schedule your appointment should you arrive 10 or more minutes late.  We strive to give you quality time with our providers, and arriving late affects you and other patients whose appointments are after yours.  Also, if you no show three or more times for appointments you may be dismissed from the clinic at the providers discretion.     Again, thank you for choosing [facility].  Our hope is that these requests will decrease the amount of time that you wait before being seen by our physicians.       _____________________________________________________________  Should you have questions after your visit to [facility], please contact our office at [phone] between the hours of 8:30 a.m. and 4:30 p.m.  Voicemails left after 4:30 p.m. will not be returned until the following business day.  For prescription refill requests, have your pharmacy contact our office.       Resources For Cancer Patients and their Caregivers ? American Cancer Society: Can assist with transportation, wigs, general needs, runs Look Good Feel Better.        970-513-5897 ? Cancer Care: Provides financial assistance, online support groups, medication/co-pay assistance.  1-800-813-HOPE 717-600-7358) ? [place] Assists [place] cancer patients and their families through emotional , educational and financial support.  308-449-7115 ? [place] DSS Where to apply for food stamps, Medicaid and utility assistance. (214) 611-4597 ? RCATS: Transportation to medical appointments. 234-672-0593 ? Social Security Administration: May apply for disability if have a Stage IV cancer. 513-121-5966 (617)007-2771 ? [employer], Disability and Transit Services: Assists with nutrition, care and transit needs. [place] Support Programs: @10RELATIVEDAYS @ > Cancer Support Group  2nd Tuesday of the month 1pm-2pm, Journey Room  > Creative Journey  3rd Tuesday of the month 1130am-1pm, Journey Room  > Look Good Feel Better  1st Wednesday of the month 10am-12 noon, Journey Room (Call [place] to register [phone])

## 2017-04-08 NOTE — Progress Notes (Signed)
[name] presents today for injection per MD orders. Aranesp 125 mcg administered SQ in left lower abdomen. Administration without incident. Patient tolerated well. Patient discharged in stable condition via wheelchair with spouse. Follow up as scheduled.

## 2017-04-13 DIAGNOSIS — H401123 Primary open-angle glaucoma, left eye, severe stage: Secondary | ICD-10-CM | POA: Diagnosis not present

## 2017-04-13 DIAGNOSIS — H401111 Primary open-angle glaucoma, right eye, mild stage: Secondary | ICD-10-CM | POA: Diagnosis not present

## 2017-04-16 ENCOUNTER — Other Ambulatory Visit: Payer: Self-pay | Admitting: "Endocrinology

## 2017-04-16 DIAGNOSIS — E1122 Type 2 diabetes mellitus with diabetic chronic kidney disease: Secondary | ICD-10-CM | POA: Diagnosis not present

## 2017-04-16 DIAGNOSIS — Z794 Long term (current) use of insulin: Secondary | ICD-10-CM | POA: Diagnosis not present

## 2017-04-16 DIAGNOSIS — N184 Chronic kidney disease, stage 4 (severe): Secondary | ICD-10-CM | POA: Diagnosis not present

## 2017-04-17 LAB — COMPREHENSIVE METABOLIC PANEL
A/G RATIO: 1 — AB (ref 1.2–2.2)
ALT: 23 IU/L (ref 0–32)
AST: 24 IU/L (ref 0–40)
Albumin: 3.3 g/dL — ABNORMAL LOW (ref 3.5–4.8)
Alkaline Phosphatase: 63 IU/L (ref 39–117)
BILIRUBIN TOTAL: 0.3 mg/dL (ref 0.0–1.2)
BUN/Creatinine Ratio: 35 — ABNORMAL HIGH (ref 12–28)
BUN: 86 mg/dL (ref 8–27)
CHLORIDE: 103 mmol/L (ref 96–106)
CO2: 21 mmol/L (ref 20–29)
Calcium: 8.5 mg/dL — ABNORMAL LOW (ref 8.7–10.3)
Creatinine, Ser: 2.44 mg/dL — ABNORMAL HIGH (ref 0.57–1.00)
GFR calc Af Amer: 22 mL/min/{1.73_m2} — ABNORMAL LOW (ref >59)
GFR calc non Af Amer: 19 mL/min/{1.73_m2} — ABNORMAL LOW (ref >59)
GLOBULIN, TOTAL: 3.3 g/dL (ref 1.5–4.5)
Glucose: 135 mg/dL — ABNORMAL HIGH (ref 65–99)
POTASSIUM: 4 mmol/L (ref 3.5–5.2)
SODIUM: 142 mmol/L (ref 134–144)
Total Protein: 6.6 g/dL (ref 6.0–8.5)

## 2017-04-17 LAB — HEMOGLOBIN: Hemoglobin: 8.7 g/dL — ABNORMAL LOW (ref 11.1–15.9)

## 2017-04-21 ENCOUNTER — Other Ambulatory Visit (HOSPITAL_COMMUNITY): Payer: Self-pay | Admitting: *Deleted

## 2017-04-21 DIAGNOSIS — N184 Chronic kidney disease, stage 4 (severe): Principal | ICD-10-CM

## 2017-04-21 DIAGNOSIS — D631 Anemia in chronic kidney disease: Secondary | ICD-10-CM

## 2017-04-22 ENCOUNTER — Encounter (HOSPITAL_COMMUNITY): Payer: Medicare Other

## 2017-04-22 ENCOUNTER — Encounter (HOSPITAL_BASED_OUTPATIENT_CLINIC_OR_DEPARTMENT_OTHER): Payer: Medicare Other

## 2017-04-22 VITALS — BP 152/69 | HR 80 | Temp 97.4°F | Resp 18

## 2017-04-22 DIAGNOSIS — N184 Chronic kidney disease, stage 4 (severe): Secondary | ICD-10-CM | POA: Diagnosis not present

## 2017-04-22 DIAGNOSIS — D631 Anemia in chronic kidney disease: Secondary | ICD-10-CM

## 2017-04-22 DIAGNOSIS — N058 Unspecified nephritic syndrome with other morphologic changes: Secondary | ICD-10-CM

## 2017-04-22 DIAGNOSIS — N189 Chronic kidney disease, unspecified: Secondary | ICD-10-CM | POA: Diagnosis not present

## 2017-04-22 LAB — CBC WITH DIFFERENTIAL/PLATELET
BASOS PCT: 0 %
Basophils Absolute: 0 10*3/uL (ref 0.0–0.1)
Eosinophils Absolute: 0.1 10*3/uL (ref 0.0–0.7)
Eosinophils Relative: 1 %
HEMATOCRIT: 29.3 % — AB (ref 36.0–46.0)
Hemoglobin: 9.1 g/dL — ABNORMAL LOW (ref 12.0–15.0)
Lymphocytes Relative: 11 %
Lymphs Abs: 0.9 10*3/uL (ref 0.7–4.0)
MCH: 26.8 pg (ref 26.0–34.0)
MCHC: 31.1 g/dL (ref 30.0–36.0)
MCV: 86.2 fL (ref 78.0–100.0)
MONO ABS: 0.6 10*3/uL (ref 0.1–1.0)
MONOS PCT: 8 %
NEUTROS ABS: 6.6 10*3/uL (ref 1.7–7.7)
Neutrophils Relative %: 80 %
Platelets: 272 10*3/uL (ref 150–400)
RBC: 3.4 MIL/uL — ABNORMAL LOW (ref 3.87–5.11)
RDW: 17.3 % — AB (ref 11.5–15.5)
WBC: 8.2 10*3/uL (ref 4.0–10.5)

## 2017-04-22 MED ORDER — DARBEPOETIN ALFA 150 MCG/0.3ML IJ SOSY
125.0000 ug | PREFILLED_SYRINGE | Freq: Once | INTRAMUSCULAR | Status: AC
  Administered 2017-04-22: 125 ug via SUBCUTANEOUS

## 2017-04-22 MED ORDER — DARBEPOETIN ALFA 150 MCG/0.3ML IJ SOSY
PREFILLED_SYRINGE | INTRAMUSCULAR | Status: AC
  Filled 2017-04-22: qty 0.3

## 2017-04-22 NOTE — Patient Instructions (Signed)
[place] at [facility] Discharge Instructions  RECOMMENDATIONS MADE BY THE CONSULTANT AND ANY TEST RESULTS WILL BE SENT TO YOUR REFERRING PHYSICIAN.  You received your aranesp injection today. Follow up as scheduled.  Thank you for choosing [place] at [facility] to provide your oncology and hematology care.  To afford each patient quality time with our provider, please arrive at least 15 minutes before your scheduled appointment time.    If you have a lab appointment with the [place] please come in thru the  Main Entrance and check in at the main information desk  You need to re-schedule your appointment should you arrive 10 or more minutes late.  We strive to give you quality time with our providers, and arriving late affects you and other patients whose appointments are after yours.  Also, if you no show three or more times for appointments you may be dismissed from the clinic at the providers discretion.     Again, thank you for choosing [facility].  Our hope is that these requests will decrease the amount of time that you wait before being seen by our physicians.       _____________________________________________________________  Should you have questions after your visit to [facility], please contact our office at [phone] between the hours of 8:30 a.m. and 4:30 p.m.  Voicemails left after 4:30 p.m. will not be returned until the following business day.  For prescription refill requests, have your pharmacy contact our office.       Resources For Cancer Patients and their Caregivers ? American Cancer Society: Can assist with transportation, wigs, general needs, runs Look Good Feel Better.        989-592-4645 ? Cancer Care: Provides financial assistance, online support groups, medication/co-pay assistance.  1-800-813-HOPE (669)028-9191) ? [place] Assists [place]  cancer patients and their families through emotional , educational and financial support.  9724254272 ? [place] DSS Where to apply for food stamps, Medicaid and utility assistance. (308)157-5477 ? RCATS: Transportation to medical appointments. 5597914923 ? Social Security Administration: May apply for disability if have a Stage IV cancer. [phone] 4356535253 ? [employer], Disability and Transit Services: Assists with nutrition, care and transit needs. [place] Support Programs: @10RELATIVEDAYS @ > Cancer Support Group  2nd Tuesday of the month 1pm-2pm, Journey Room  > Creative Journey  3rd Tuesday of the month 1130am-1pm, Journey Room  > Look Good Feel Better  1st Wednesday of the month 10am-12 noon, Journey Room (Call [place] to register [phone])

## 2017-04-22 NOTE — Progress Notes (Signed)
[name] presents today for injection per MD orders. Aranesp 125 mcg administered SQ in left lower abdomen. Administration without incident. Patient tolerated well.  Patient discharged in stable condition via wheelchair with spouse.  Follow up as scheduled.

## 2017-04-23 ENCOUNTER — Ambulatory Visit (INDEPENDENT_AMBULATORY_CARE_PROVIDER_SITE_OTHER): Payer: Medicare Other | Admitting: "Endocrinology

## 2017-04-23 ENCOUNTER — Encounter: Payer: Self-pay | Admitting: "Endocrinology

## 2017-04-23 VITALS — BP 141/64 | HR 63 | Ht 65.0 in | Wt 210.0 lb

## 2017-04-23 DIAGNOSIS — E1122 Type 2 diabetes mellitus with diabetic chronic kidney disease: Secondary | ICD-10-CM | POA: Diagnosis not present

## 2017-04-23 DIAGNOSIS — Z794 Long term (current) use of insulin: Secondary | ICD-10-CM | POA: Diagnosis not present

## 2017-04-23 DIAGNOSIS — E782 Mixed hyperlipidemia: Secondary | ICD-10-CM | POA: Diagnosis not present

## 2017-04-23 DIAGNOSIS — I10 Essential (primary) hypertension: Secondary | ICD-10-CM | POA: Diagnosis not present

## 2017-04-23 DIAGNOSIS — Z6835 Body mass index (BMI) 35.0-35.9, adult: Secondary | ICD-10-CM

## 2017-04-23 DIAGNOSIS — N184 Chronic kidney disease, stage 4 (severe): Secondary | ICD-10-CM | POA: Diagnosis not present

## 2017-04-23 NOTE — Progress Notes (Signed)
Subjective:    Patient ID: [name], female    DOB: 12-10-1945,    Past Medical History:  Diagnosis Date  . Anemia   . Asthma   . Atrial flutter [facility])    TEE/DCCV December 2013 - on Xarelto [facility])  . Bone spur of toe    Right 5th toe  . CKD (chronic Little disease) stage 3, GFR 30-59 ml/min   . DVT of upper extremity (deep vein thrombosis) ([place])    Right basilic vein, June 3557  . Essential hypertension, benign   . SLE (systemic lupus erythematosus) ([place])   . Type 2 diabetes mellitus ([place])   . UTI (lower urinary tract infection)    Past Surgical History:  Procedure Laterality Date  . ABDOMINAL HYSTERECTOMY  1983  . ANKLE SURGERY  1993  . AV FISTULA PLACEMENT Left 03/27/2014   Procedure: ARTERIOVENOUS (AV) FISTULA CREATION;  Surgeon: Rosetta Posner, MD;  Location: [place];  Service: Vascular;  Laterality: Left;  . BACK SURGERY  1980  . CARDIOVASCULAR STRESS TEST  12/19/2009   no stress induced rhythm abnormalities, ekg negative for ischemia  . CARDIOVERSION  09/16/2012   Procedure: CARDIOVERSION;  Surgeon: Sanda Klein, MD;  Location: [facility] ENDOSCOPY;  Service: Cardiovascular;  Laterality: N/A;  . COLONOSCOPY  09/20/2012   Dr. [name]: multiple tubular adenomas   . COLONOSCOPY  2005   Dr. [name]. Polyps, path unknown   . COLONOSCOPY N/A 07/24/2016   Procedure: COLONOSCOPY;  Surgeon: [name], MD;  [facility];  Service: Endoscopy;  Laterality: N/A;  9:00 am  . ESOPHAGOGASTRODUODENOSCOPY  09/20/2012   Dr. [name]: duodenal erosion and possible resolving ulcer at the angularis   . TEE WITHOUT CARDIOVERSION  09/16/2012   Procedure: TRANSESOPHAGEAL ECHOCARDIOGRAM (TEE);  Surgeon: Sanda Klein, MD;  Location: [facility] ENDOSCOPY;  Service: Cardiovascular;  Laterality: N/A;  . TRANSESOPHAGEAL ECHOCARDIOGRAM WITH CARDIOVERSION  09/16/2012   EF 60-65%, moderate LVH, moderate regurg of the aortic valve, LA moderately dilated   Social History   Social History   . Marital status: Married    Spouse name: N/A  . Number of children: N/A  . Years of education: N/A   Social History Main Topics  . Smoking status: Former Smoker    Packs/day: 1.00    Years: 30.00    Types: Cigarettes    Quit date: 08/29/2012  . Smokeless tobacco: Never Used     Comment: quit 08-2012  . Alcohol use No  . Drug use: No  . Sexual activity: Yes    Partners: Male   Other Topics Concern  . None   Social History Narrative  . None   Outpatient Encounter Prescriptions as of 04/23/2017  Medication Sig  . Insulin Detemir (LEVEMIR FLEXTOUCH [place]) Inject 15 Units into the skin at bedtime.  Marland Kitchen acetaminophen (TYLENOL) 500 MG tablet Take 1,000 mg by mouth every 6 (six) hours as needed. For pain  . albuterol (ACCUNEB) 0.63 MG/3ML nebulizer solution Take 1 ampule by nebulization every 6 (six) hours as needed for wheezing or shortness of breath.   Marland Kitchen albuterol (PROVENTIL HFA;VENTOLIN HFA) 108 (90 BASE) MCG/ACT inhaler Inhale 2 puffs into the lungs every 6 (six) hours as needed. For shortness of breath  . amLODipine (NORVASC) 10 MG tablet Take 1 tablet (10 mg total) by mouth at bedtime.  Marland Kitchen atorvastatin (LIPITOR) 40 MG tablet Take 20 mg by mouth at bedtime.   . brimonidine-timolol (COMBIGAN) 0.2-0.5 % ophthalmic solution Place 1  drop into the left eye daily.   . calcitRIOL (ROCALTROL) 0.25 MCG capsule Take 0.25 mcg by mouth as directed. Take on Mondays Wednesdays and Fridays  . diclofenac (VOLTAREN) 0.1 % ophthalmic solution Place 1 drop into the left eye daily. Uses at lunch time  . febuxostat (ULORIC) 40 MG tablet Take 40 mg by mouth daily.  . folic acid (FOLVITE) 1 MG tablet Take 1 mg by mouth daily.  . furosemide (LASIX) 80 MG tablet Take 160 mg by mouth 2 (two) times daily.   Marland Kitchen HUMALOG KWIKPEN 100 UNIT/ML KiwkPen INJECT UP TO 11 UNITS SUBCUTANEOUSLY BEFORE MEALS THREE TIMES DAILY.  Marland Kitchen HYDROcodone-acetaminophen (NORCO/VICODIN) 5-325 MG per tablet Take 1-2 tablets by mouth every 4  (four) hours as needed for moderate pain.  . hydroxychloroquine (PLAQUENIL) 200 MG tablet Take 200 mg by mouth as directed. Take 1 1/2 tablets daily  . metoprolol (LOPRESSOR) 50 MG tablet TAKE ONE TABLET BY MOUTH TWICE DAILY.  Marland Kitchen Misc Natural Products (TART CHERRY ADVANCED) CAPS Take 1 capsule by mouth 2 (two) times daily.  . Multiple Vitamins-Minerals (CENTRUM SILVER PO) Take 1 tablet by mouth daily.  . ONE TOUCH ULTRA TEST test strip TEST 4 TIMES A DAY AS DIRECTED.  Marland Kitchen ONETOUCH DELICA LANCETS 81E MISC USE TO TEST 4 TIMES DAILY.  Marland Kitchen predniSONE (DELTASONE) 5 MG tablet Take 5 mg by mouth daily with breakfast.  . travoprost, benzalkonium, (TRAVATAN) 0.004 % ophthalmic solution Place 1 drop into the left eye at bedtime.  Marland Kitchen UNIFINE PENTIPS 31G X 6 MM MISC USE AS DIRECTED AT BEDTIME WITH LEVEMIR AND WITH SLIDING SCALE INSULIN.  Marland Kitchen Vitamin D, Ergocalciferol, (DRISDOL) 50000 units CAPS capsule TAKE 1 CAPSULE BY MOUTH ONCE WEEKLY.  . warfarin (COUMADIN) 5 MG tablet Take 1 tablet daily  . [DISCONTINUED] Insulin Detemir (LEVEMIR FLEXTOUCH) 100 UNIT/ML Pen INJECT 10 UNITS SUBCUTANEOUSLY AT BEDTIME.   No facility-administered encounter medications on file as of 04/23/2017.    ALLERGIES: Allergies  Allergen Reactions  . Ace Inhibitors Cough   VACCINATION STATUS: Immunization History  Administered Date(s) Administered  . Influenza,inj,Quad PF,36+ Mos 07/03/2014  . Influenza-Unspecified 07/11/2015, 06/27/2016    Diabetes  She presents for her follow-up diabetic visit. She has type 2 diabetes mellitus. Onset time: diagnosed at approx age of 39. Her disease course has been stable. Pertinent negatives for hypoglycemia include no confusion, headaches, pallor or seizures. Associated symptoms include fatigue. Pertinent negatives for diabetes include no chest pain, no polydipsia, no polyphagia and no polyuria. Symptoms are stable. Diabetic complications include nephropathy. Risk factors for coronary artery disease  include dyslipidemia, diabetes mellitus and obesity. She is compliant with treatment most of the time. Her weight is stable. She is following a generally unhealthy diet. She has had a previous visit with a dietitian. She never participates in exercise. Her home blood glucose trend is increasing steadily. Her breakfast blood glucose range is generally 140-180 mg/dl. Her lunch blood glucose range is generally 130-140 mg/dl. Her dinner blood glucose range is generally 130-140 mg/dl. Her overall blood glucose range is 140-180 mg/dl. An ACE inhibitor/angiotensin II receptor blocker is being taken.  Hypertension  Pertinent negatives include no chest pain, headaches, palpitations or shortness of breath.  Hyperlipidemia  Pertinent negatives include no chest pain, myalgias or shortness of breath. Current antihyperlipidemic treatment includes statins.     Review of Systems  Constitutional: Positive for fatigue. Negative for unexpected weight change.  HENT: Negative for trouble swallowing and voice change.   Eyes: Negative for  visual disturbance.  Respiratory: Negative for cough, shortness of breath and wheezing.   Cardiovascular: Negative for chest pain, palpitations and leg swelling.  Gastrointestinal: Negative for diarrhea, nausea and vomiting.  Endocrine: Negative for cold intolerance, heat intolerance, polydipsia, polyphagia and polyuria.  Musculoskeletal: Negative for arthralgias and myalgias.  Skin: Negative for color change, pallor, rash and wound.  Neurological: Negative for seizures and headaches.  Psychiatric/Behavioral: Negative for confusion and suicidal ideas.    Objective:    BP (!) 141/64   Pulse 63   Ht 5\' 5"  (1.651 m)   Wt 210 lb (95.3 kg)   BMI 34.95 kg/m   Wt Readings from Last 3 Encounters:  04/23/17 210 lb (95.3 kg)  01/16/17 212 lb (96.2 kg)  01/14/17 212 lb 12.8 oz (96.5 kg)    Physical Exam  Constitutional: She is oriented to person, place, and time. She appears  well-developed.  HENT:  Head: Normocephalic and atraumatic.  Eyes: EOM are normal.  Neck: Normal range of motion. Neck supple. No tracheal deviation present. No thyromegaly present.  Cardiovascular: Normal rate and regular rhythm.   Pulmonary/Chest: Effort normal and breath sounds normal.  Abdominal: Soft. Bowel sounds are normal. There is no tenderness. There is no guarding.  Musculoskeletal: Normal range of motion. She exhibits no edema.  Neurological: She is alert and oriented to person, place, and time. She has normal reflexes. No cranial nerve deficit. Coordination normal.  Skin: Skin is warm and dry. No rash noted. No erythema. No pallor.  Psychiatric: She has a normal mood and affect. Judgment normal.    Chemistry (most recent): Lab Results  Component Value Date   NA 142 04/16/2017   K 4.0 04/16/2017   CL 103 04/16/2017   CO2 21 04/16/2017   BUN 86 (HH) 04/16/2017   CREATININE 2.44 (H) 04/16/2017   Diabetic Labs (most recent): Lab Results  Component Value Date   HGBA1C 5.8 (H) 01/09/2017   HGBA1C 5.7 (H) 10/17/2016   HGBA1C 5.7 (H) 07/10/2016     Assessment & Plan:   1. Type 2 diabetes mellitus with stage 4 chronic Little disease- status post fistula placement for hemodialysis. - She is not started on hemodialysis. Patient is advised to stick to a routine mealtimes to eat 3 meals  a day and avoid unnecessary snacks ( to snack only to correct hypoglycemia). - Her recent labs did not include A1c, during her last visit A1c was 5.8% however that was in sharp discrepancy from her blood glucose profile.  - Her fasting blood glucose profile shows above target readings between 150 and 175.  - She has history of iron deficiency anemia, [facility]. Patient is advised to eliminate simple carbs  from their diet including cakes desserts ice cream soda (  diet and regular) , sweet tea , Candies,  chips and cookies, artificial sweeteners,   and  "sugar-free" products .  This will help patient to have stable blood glucose profile and potentially lose weight. Patient is given detailed personalized glucose monitoring and insulin dosing instructions. Patient is instructed to call back with extremes of blood glucose less than 70 or greater than 300. Patient to bring meter and  blood glucose logs during their next visit.  I will  increase Levemir to 15 units daily at bedtime,  continue humalog 5 units TIDAC plus correction. -She has a chance to come off of Humalog if she is willing to avoid concentrated carbohydrates such as soda. - She is  advised to hold off insulin treatment during her preparation for colonoscopy with adequate diet. She will call clinic if she registers greater than 300 mg/dL.  2. Essential hypertension, benign Controlled. Continue ARB.  3. Hyperlipemia Continue atorvastatatin.  4. Morbid obesity Carbs advise given. See above #1.  5. vitamin D deficiency.  -Continue current Vitamin D supplement.  6) Chronic Care/Health Maintenance:  -Patient is on  Statin medications and encouraged to continue to follow up with Ophthalmology, nephrology for end-stage renal disease status post fistula placement, Podiatrist at least yearly or according to recommendations, and advised to   stay away from smoking. I have recommended yearly flu vaccine and pneumonia vaccination at least every 5 years; moderate intensity exercise for up to 150 minutes weekly; and  sleep for at least 7 hours a day.  - 25 minutes of time was spent on the care of this patient , 50% of which was applied for counseling on diabetes complications and their preventions.  Follow up plan: Return in about 3 months (around 07/24/2017) for meter, and logs.  Glade Lloyd, MD Phone: [phone]  Fax: [phone]   04/23/2017, 11:40 AM

## 2017-04-23 NOTE — Patient Instructions (Signed)

## 2017-04-27 ENCOUNTER — Ambulatory Visit (INDEPENDENT_AMBULATORY_CARE_PROVIDER_SITE_OTHER): Payer: Medicare Other | Admitting: *Deleted

## 2017-04-27 DIAGNOSIS — I4892 Unspecified atrial flutter: Secondary | ICD-10-CM

## 2017-04-27 DIAGNOSIS — Z5181 Encounter for therapeutic drug level monitoring: Secondary | ICD-10-CM

## 2017-04-27 LAB — POCT INR: INR: 4.8

## 2017-05-05 DIAGNOSIS — N189 Chronic kidney disease, unspecified: Secondary | ICD-10-CM | POA: Diagnosis not present

## 2017-05-05 DIAGNOSIS — Z7952 Long term (current) use of systemic steroids: Secondary | ICD-10-CM | POA: Diagnosis not present

## 2017-05-05 DIAGNOSIS — M25511 Pain in right shoulder: Secondary | ICD-10-CM | POA: Diagnosis not present

## 2017-05-05 DIAGNOSIS — D649 Anemia, unspecified: Secondary | ICD-10-CM | POA: Diagnosis not present

## 2017-05-05 DIAGNOSIS — M329 Systemic lupus erythematosus, unspecified: Secondary | ICD-10-CM | POA: Diagnosis not present

## 2017-05-06 ENCOUNTER — Encounter (HOSPITAL_COMMUNITY): Payer: Medicare Other | Attending: Oncology | Admitting: Oncology

## 2017-05-06 ENCOUNTER — Encounter (HOSPITAL_BASED_OUTPATIENT_CLINIC_OR_DEPARTMENT_OTHER): Payer: Medicare Other

## 2017-05-06 ENCOUNTER — Encounter (HOSPITAL_COMMUNITY): Payer: Self-pay

## 2017-05-06 ENCOUNTER — Encounter (HOSPITAL_COMMUNITY): Payer: Medicare Other

## 2017-05-06 VITALS — BP 152/72 | HR 71 | Temp 98.4°F | Resp 18 | Ht 65.0 in | Wt 211.8 lb

## 2017-05-06 DIAGNOSIS — N184 Chronic kidney disease, stage 4 (severe): Secondary | ICD-10-CM

## 2017-05-06 DIAGNOSIS — D631 Anemia in chronic kidney disease: Secondary | ICD-10-CM

## 2017-05-06 DIAGNOSIS — Z86718 Personal history of other venous thrombosis and embolism: Secondary | ICD-10-CM

## 2017-05-06 DIAGNOSIS — Z7901 Long term (current) use of anticoagulants: Secondary | ICD-10-CM | POA: Diagnosis not present

## 2017-05-06 DIAGNOSIS — N189 Chronic kidney disease, unspecified: Secondary | ICD-10-CM

## 2017-05-06 DIAGNOSIS — N185 Chronic kidney disease, stage 5: Secondary | ICD-10-CM

## 2017-05-06 DIAGNOSIS — N058 Unspecified nephritic syndrome with other morphologic changes: Secondary | ICD-10-CM

## 2017-05-06 LAB — COMPREHENSIVE METABOLIC PANEL
ALK PHOS: 69 U/L (ref 38–126)
ALT: 57 U/L — AB (ref 14–54)
ANION GAP: 12 (ref 5–15)
AST: 90 U/L — ABNORMAL HIGH (ref 15–41)
Albumin: 3.1 g/dL — ABNORMAL LOW (ref 3.5–5.0)
BUN: 83 mg/dL — ABNORMAL HIGH (ref 6–20)
CALCIUM: 8.4 mg/dL — AB (ref 8.9–10.3)
CO2: 23 mmol/L (ref 22–32)
CREATININE: 2.77 mg/dL — AB (ref 0.44–1.00)
Chloride: 105 mmol/L (ref 101–111)
GFR, EST AFRICAN AMERICAN: 19 mL/min — AB (ref >60)
GFR, EST NON AFRICAN AMERICAN: 16 mL/min — AB (ref >60)
Glucose, Bld: 197 mg/dL — ABNORMAL HIGH (ref 65–99)
Potassium: 4.1 mmol/L (ref 3.5–5.1)
SODIUM: 140 mmol/L (ref 135–145)
Total Bilirubin: 0.7 mg/dL (ref 0.3–1.2)
Total Protein: 7.5 g/dL (ref 6.5–8.1)

## 2017-05-06 LAB — CBC WITH DIFFERENTIAL/PLATELET
Basophils Absolute: 0 10*3/uL (ref 0.0–0.1)
Basophils Relative: 0 %
EOS ABS: 0 10*3/uL (ref 0.0–0.7)
EOS PCT: 0 %
HCT: 26.6 % — ABNORMAL LOW (ref 36.0–46.0)
HEMOGLOBIN: 8.2 g/dL — AB (ref 12.0–15.0)
LYMPHS ABS: 0.7 10*3/uL (ref 0.7–4.0)
LYMPHS PCT: 9 %
MCH: 27 pg (ref 26.0–34.0)
MCHC: 30.8 g/dL (ref 30.0–36.0)
MCV: 87.5 fL (ref 78.0–100.0)
MONOS PCT: 3 %
Monocytes Absolute: 0.3 10*3/uL (ref 0.1–1.0)
Neutro Abs: 6.9 10*3/uL (ref 1.7–7.7)
Neutrophils Relative %: 88 %
PLATELETS: 231 10*3/uL (ref 150–400)
RBC: 3.04 MIL/uL — AB (ref 3.87–5.11)
RDW: 17.9 % — ABNORMAL HIGH (ref 11.5–15.5)
WBC: 7.9 10*3/uL (ref 4.0–10.5)

## 2017-05-06 LAB — IRON AND TIBC
Iron: 38 ug/dL (ref 28–170)
SATURATION RATIOS: 12 % (ref 10.4–31.8)
TIBC: 316 ug/dL (ref 250–450)
UIBC: 278 ug/dL

## 2017-05-06 LAB — FERRITIN: Ferritin: 72 ng/mL (ref 11–307)

## 2017-05-06 MED ORDER — DARBEPOETIN ALFA 300 MCG/0.6ML IJ SOSY
PREFILLED_SYRINGE | INTRAMUSCULAR | Status: AC
  Filled 2017-05-06: qty 0.6

## 2017-05-06 MED ORDER — DARBEPOETIN ALFA 300 MCG/0.6ML IJ SOSY
300.0000 ug | PREFILLED_SYRINGE | Freq: Once | INTRAMUSCULAR | Status: AC
  Administered 2017-05-06: 300 ug via SUBCUTANEOUS

## 2017-05-06 NOTE — Progress Notes (Signed)
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 female returns for follow-up of anemia. She has lupus and FSG. She also has a history of folic acid deficiency. She has ongoing Coumadin use because of a history of an upper extremity DVT and atrial flutter. Coumadin is currently being continued for stroke prophylaxis. She has a fistula placed in preparation for dialysis.  Patient states that overall she has been doing well. She does complain of fatigue and not having the energy to do things. She denies any bleeding. She denies any flares of her lupus. Denies chest pain, SOB, abdominal pain, leg swelling, loss of appetite, or any other concerns.   MEDICAL HISTORY: Past Medical History:  Diagnosis Date  . Anemia   . Asthma   . Atrial flutter [facility])    TEE/DCCV December 2013 - on Xarelto [facility])  . Bone spur of toe    Right 5th toe  . CKD (chronic kidney disease) stage 3, GFR 30-59 ml/min   . DVT of upper extremity (deep vein thrombosis) ([place])    Right basilic vein, June 5993  . Essential hypertension, benign   . SLE (systemic lupus erythematosus) ([place])   . Type 2 diabetes mellitus ([place])   . UTI (lower urinary tract infection)     has Atrial flutter ([place]); Morbid obesity ([place]); Type 2 diabetes mellitus with diabetic neuropathy (HCC); SLE (systemic lupus erythematosus) ([place]); Essential hypertension, benign; COPD (chronic obstructive pulmonary disease) ([place]); Aortic regurgitation; Folate deficiency; Anemia of chronic renal failure, stage 4 (severe) ([place]); Renal failure (ARF), acute on chronic ([place]); Chronic diastolic heart failure ([place]); Encounter for therapeutic drug monitoring; Focal segmental  glomerulosclerosis without nephrosis or chronic glomerulonephritis; Encounter for adequacy testing for hemodialysis ([place]); Chronic kidney disease, stage V ([place]); Hyperlipidemia; Type 2 diabetes mellitus with stage 4 chronic kidney disease ([place]); and Heme positive stool on her problem list.     is allergic to ace inhibitors.  [name] had no medications administered during this visit.  SURGICAL HISTORY: Past Surgical History:  Procedure Laterality Date  . ABDOMINAL HYSTERECTOMY  1983  . ANKLE SURGERY  1993  . AV FISTULA PLACEMENT Left 03/27/2014   Procedure: ARTERIOVENOUS (AV) FISTULA CREATION;  Surgeon: Rosetta Posner, MD;  Location: [place];  Service: Vascular;  Laterality: Left;  . BACK SURGERY  1980  . CARDIOVASCULAR STRESS TEST  12/19/2009   no stress induced rhythm abnormalities, ekg negative for ischemia  . CARDIOVERSION  09/16/2012   Procedure: CARDIOVERSION;  Surgeon: Sanda Klein, MD;  Location: [facility] ENDOSCOPY;  Service: Cardiovascular;  Laterality: N/A;  . COLONOSCOPY  09/20/2012   Dr. [name]: multiple tubular adenomas   . COLONOSCOPY  2005   Dr. [name]. Polyps, path unknown   . COLONOSCOPY N/A 07/24/2016   Procedure: COLONOSCOPY;  Surgeon: [name], MD;  [facility];  Service: Endoscopy;  Laterality: N/A;  9:00 am  . ESOPHAGOGASTRODUODENOSCOPY  09/20/2012   Dr. [name]: duodenal erosion and possible resolving ulcer at the angularis   . TEE WITHOUT CARDIOVERSION  09/16/2012   Procedure: TRANSESOPHAGEAL ECHOCARDIOGRAM (TEE);  Surgeon: Sanda Klein, MD;  Location: [facility] END[facility]COPY;  Service: Cardiovascular;  Laterality: N/A;  . TRANSESOPHAGEAL ECHOCARDIOGRAM WITH CARDIOVERSION  09/16/2012   EF  60-65%, moderate LVH, moderate regurg of the aortic valve, LA moderately dilated    SOCIAL HISTORY: Social History   Social History  . Marital status: Married    Spouse name: N/A  . Number of children: N/A  . Years of education: N/A   Occupational History  . Not  on file.   Social History Main Topics  . Smoking status: Former Smoker    Packs/day: 1.00    Years: 30.00    Types: Cigarettes    Quit date: 08/29/2012  . Smokeless tobacco: Never Used     Comment: quit 08-2012  . Alcohol use No  . Drug use: No  . Sexual activity: Yes    Partners: Male   Other Topics Concern  . Not on file   Social History Narrative  . No narrative on file    FAMILY HISTORY: Family History  Problem Relation Age of Onset  . Diabetes Brother   . Diabetes Father   . Hypertension Father   . Hypertension Sister   . Stroke Mother   . Diabetes Sister   . Hypertension Brother   . Colon cancer Neg Hx    Review of Systems  Constitutional: Positive for malaise/fatigue.       No loss of appetite   HENT: Negative.   Eyes: Negative.   Respiratory: Negative.  Negative for shortness of breath.   Cardiovascular: Negative.  Negative for chest pain and leg swelling.  Gastrointestinal: Negative.  Negative for abdominal pain.  Genitourinary: Negative.   Musculoskeletal: Negative for joint pain and myalgias.  Skin: Negative.   Neurological: Negative.   Endo/Heme/Allergies: Negative.   Psychiatric/Behavioral: Negative.   All other systems reviewed and are negative. 14 point review of systems was performed and is negative except as detailed under history of present illness and above  PHYSICAL EXAMINATION ECOG PERFORMANCE STATUS: 1 - Symptomatic but completely ambulatory  Vitals:   05/06/17 1038  BP: (!) 152/72  Pulse: 71  Resp: 18  Temp: 98.4 F (36.9 C)   Physical Exam  Constitutional: She is oriented to person, place, and time and well-developed, well-nourished, and in no distress.  HENT:  Head: Normocephalic and atraumatic.  Mouth/Throat: Oropharynx is clear and moist.  Dentures top and bottom  Eyes: Pupils are equal, round, and reactive to light. Conjunctivae and EOM are normal.  Neck: Normal range of motion. Neck supple.  Cardiovascular: Normal rate,  regular rhythm and normal heart sounds.   Pulmonary/Chest: Effort normal and breath sounds normal.  Abdominal: Soft. Bowel sounds are normal.  Musculoskeletal: Normal range of motion.  Neurological: She is alert and oriented to person, place, and time. Gait normal.  Skin: Skin is warm and dry.  Nursing note and vitals reviewed.  LABORATORY DATA: I have reviewed the data as listed. CBC CBC Latest Ref Rng & Units 05/06/2017 04/22/2017 04/16/2017  WBC 4.0 - 10.5 K/uL 7.9 8.2 -  Hemoglobin 12.0 - 15.0 g/dL 8.2(L) 9.1(L) 8.7(L)  Hematocrit 36.0 - 46.0 % 26.6(L) 29.3(L) -  Platelets 150 - 400 K/uL 231 272 -   CMP     Component Value Date/Time   NA 140 05/06/2017 1005   NA 142 04/16/2017 1208   K 4.1 05/06/2017 1005   CL 105 05/06/2017 1005   CO2 23 05/06/2017 1005   GLUCOSE 197 (H) 05/06/2017 1005   BUN 83 (H) 05/06/2017 1005   BUN 86 (HH) 04/16/2017 1208   CREATININE 2.77 (H) 05/06/2017 1005   CALCIUM 8.4 (L) 05/06/2017 1005  CALCIUM 7.0 (L) 03/16/2014 0835   PROT 7.5 05/06/2017 1005   PROT 6.6 04/16/2017 1208   ALBUMIN 3.1 (L) 05/06/2017 1005   ALBUMIN 3.3 (L) 04/16/2017 1208   AST 90 (H) 05/06/2017 1005   ALT 57 (H) 05/06/2017 1005   ALKPHOS 69 05/06/2017 1005   BILITOT 0.7 05/06/2017 1005   BILITOT 0.3 04/16/2017 1208   GFRNONAA 16 (L) 05/06/2017 1005   GFRAA 19 (L) 05/06/2017 1005    ASSESSMENT and THERAPY PLAN:   Anemia Iron deficiency Lupus Stage IV Kidney disease, FSG History of heme positive stool   71 y.o. female with lupus and kidney disease, FSG. She has underlying anemia which is certainly multi factorial including folic acid deficiency, her chronic kidney disease, and anemia of chronic disease from her lupus.   PLAN: Hemoglobin has been consistently in the 8-9 g/dL range in the past few months, therefore I will increase her aranesp to 344mcg q2 weeks. Goal is to get her hemoglobin close to 11 g/dL.  Continue labs and araneso every 2 weeks.   She will  return for follow up in 4 months with labs.    All questions were answered. The patient knows to call the clinic with any problems, questions or concerns. We can certainly see the patient much sooner if necessary. This note was electronically signed.  I have reviewed the above documentation for accuracy and completeness, and I agree with the above. Twana First, MD

## 2017-05-06 NOTE — Patient Instructions (Signed)
[place] at [facility] Discharge Instructions  RECOMMENDATIONS MADE BY THE CONSULTANT AND ANY TEST RESULTS WILL BE SENT TO YOUR REFERRING PHYSICIAN.  You received your Aranesp injection today Follow up in 2 weeks for next injection   Thank you for choosing [place] at [facility] to provide your oncology and hematology care.  To afford each patient quality time with our provider, please arrive at least 15 minutes before your scheduled appointment time.    If you have a lab appointment with the [facility] please come in thru the  Main Entrance and check in at the main information desk  You need to re-schedule your appointment should you arrive 10 or more minutes late.  We strive to give you quality time with our providers, and arriving late affects you and other patients whose appointments are after yours.  Also, if you no show three or more times for appointments you may be dismissed from the clinic at the providers discretion.     Again, thank you for choosing [facility].  Our hope is that these requests will decrease the amount of time that you wait before being seen by our physicians.       _____________________________________________________________  Should you have questions after your visit to [facility], please contact our office at [phone] between the hours of 8:30 a.m. and 4:30 p.m.  Voicemails left after 4:30 p.m. will not be returned until the following business day.  For prescription refill requests, have your pharmacy contact our office.       Resources For Cancer Patients and their Caregivers ? American Cancer Society: Can assist with transportation, wigs, general needs, runs Look Good Feel Better.        330-185-2813 ? Cancer Care: Provides financial assistance, online support groups, medication/co-pay assistance.  1-800-813-HOPE 7034029127) ? [place] Assists [place] cancer patients and their families through emotional , educational and financial support.  325-366-2670 ? [place] DSS Where to apply for food stamps, Medicaid and utility assistance. 747-362-9246 ? RCATS: Transportation to medical appointments. (641) 817-4722 ? Social Security Administration: May apply for disability if have a Stage IV cancer. 629-150-2775 (443)579-0136 ? [employer], Disability and Transit Services: Assists with nutrition, care and transit needs. [place] Support Programs: @10RELATIVEDAYS @ > Cancer Support Group  2nd Tuesday of the month 1pm-2pm, Journey Room  > Creative Journey  3rd Tuesday of the month 1130am-1pm, Journey Room  > Look Good Feel Better  1st Wednesday of the month 10am-12 noon, Journey Room (Call [place] to register [phone])

## 2017-05-06 NOTE — Progress Notes (Signed)
[name] presents today for injection per MD orders. Aranesp 300 mcg administered SQ in left lower abdomen. Administration without incident. Patient tolerated well. Patient discharged in stable condition via wheelchair with spouse to home Follow up as scheduled.

## 2017-05-11 ENCOUNTER — Ambulatory Visit (INDEPENDENT_AMBULATORY_CARE_PROVIDER_SITE_OTHER): Payer: Medicare Other | Admitting: *Deleted

## 2017-05-11 DIAGNOSIS — Z5181 Encounter for therapeutic drug level monitoring: Secondary | ICD-10-CM | POA: Diagnosis not present

## 2017-05-11 DIAGNOSIS — I4892 Unspecified atrial flutter: Secondary | ICD-10-CM

## 2017-05-11 LAB — POCT INR: INR: 2.4

## 2017-05-12 DIAGNOSIS — L89892 Pressure ulcer of other site, stage 2: Secondary | ICD-10-CM | POA: Diagnosis not present

## 2017-05-12 DIAGNOSIS — E1151 Type 2 diabetes mellitus with diabetic peripheral angiopathy without gangrene: Secondary | ICD-10-CM | POA: Diagnosis not present

## 2017-05-12 DIAGNOSIS — E1140 Type 2 diabetes mellitus with diabetic neuropathy, unspecified: Secondary | ICD-10-CM | POA: Diagnosis not present

## 2017-05-14 DIAGNOSIS — S80812A Abrasion, left lower leg, initial encounter: Secondary | ICD-10-CM | POA: Diagnosis not present

## 2017-05-19 ENCOUNTER — Other Ambulatory Visit (HOSPITAL_COMMUNITY): Payer: Self-pay | Admitting: *Deleted

## 2017-05-19 DIAGNOSIS — N184 Chronic kidney disease, stage 4 (severe): Principal | ICD-10-CM

## 2017-05-19 DIAGNOSIS — D631 Anemia in chronic kidney disease: Secondary | ICD-10-CM

## 2017-05-20 ENCOUNTER — Encounter (HOSPITAL_COMMUNITY): Payer: Medicare Other | Attending: Oncology

## 2017-05-20 ENCOUNTER — Encounter (HOSPITAL_COMMUNITY): Payer: Medicare Other

## 2017-05-20 VITALS — BP 166/64 | HR 60 | Resp 18

## 2017-05-20 DIAGNOSIS — D631 Anemia in chronic kidney disease: Secondary | ICD-10-CM

## 2017-05-20 DIAGNOSIS — N184 Chronic kidney disease, stage 4 (severe): Secondary | ICD-10-CM | POA: Diagnosis not present

## 2017-05-20 DIAGNOSIS — N185 Chronic kidney disease, stage 5: Secondary | ICD-10-CM | POA: Diagnosis not present

## 2017-05-20 DIAGNOSIS — N058 Unspecified nephritic syndrome with other morphologic changes: Secondary | ICD-10-CM

## 2017-05-20 DIAGNOSIS — N189 Chronic kidney disease, unspecified: Secondary | ICD-10-CM

## 2017-05-20 LAB — CBC WITH DIFFERENTIAL/PLATELET
BASOS ABS: 0 10*3/uL (ref 0.0–0.1)
Basophils Relative: 0 %
EOS PCT: 1 %
Eosinophils Absolute: 0.1 10*3/uL (ref 0.0–0.7)
HCT: 28.1 % — ABNORMAL LOW (ref 36.0–46.0)
HEMOGLOBIN: 8.7 g/dL — AB (ref 12.0–15.0)
LYMPHS PCT: 12 %
Lymphs Abs: 0.9 10*3/uL (ref 0.7–4.0)
MCH: 26.8 pg (ref 26.0–34.0)
MCHC: 31 g/dL (ref 30.0–36.0)
MCV: 86.5 fL (ref 78.0–100.0)
Monocytes Absolute: 0.1 10*3/uL (ref 0.1–1.0)
Monocytes Relative: 2 %
NEUTROS PCT: 85 %
Neutro Abs: 6.8 10*3/uL (ref 1.7–7.7)
PLATELETS: 231 10*3/uL (ref 150–400)
RBC: 3.25 MIL/uL — AB (ref 3.87–5.11)
RDW: 17.3 % — ABNORMAL HIGH (ref 11.5–15.5)
WBC: 8 10*3/uL (ref 4.0–10.5)

## 2017-05-20 MED ORDER — DARBEPOETIN ALFA 300 MCG/0.6ML IJ SOSY
300.0000 ug | PREFILLED_SYRINGE | Freq: Once | INTRAMUSCULAR | Status: AC
  Administered 2017-05-20: 300 ug via SUBCUTANEOUS

## 2017-05-20 MED ORDER — DARBEPOETIN ALFA 300 MCG/0.6ML IJ SOSY
PREFILLED_SYRINGE | INTRAMUSCULAR | Status: AC
  Filled 2017-05-20: qty 0.6

## 2017-05-20 NOTE — Progress Notes (Signed)
[name] presents today for injection per MD orders. Aranesp 300 mcg administered SQ in left Abdomen. Administration without incident. Patient tolerated well. Stable on discharge home via wheelchair with husband.

## 2017-05-20 NOTE — Patient Instructions (Signed)
[place] at [facility] Discharge Instructions  RECOMMENDATIONS MADE BY THE CONSULTANT AND ANY TEST RESULTS WILL BE SENT TO YOUR REFERRING PHYSICIAN.  Hemoglobin 8.7 Aranesp 300 mcg injection given as ordered.  Thank you for choosing [place] at [facility] to provide your oncology and hematology care.  To afford each patient quality time with our provider, please arrive at least 15 minutes before your scheduled appointment time.    If you have a lab appointment with the [facility] please come in thru the  Main Entrance and check in at the main information desk  You need to re-schedule your appointment should you arrive 10 or more minutes late.  We strive to give you quality time with our providers, and arriving late affects you and other patients whose appointments are after yours.  Also, if you no show three or more times for appointments you may be dismissed from the clinic at the providers discretion.     Again, thank you for choosing [facility].  Our hope is that these requests will decrease the amount of time that you wait before being seen by our physicians.       _____________________________________________________________  Should you have questions after your visit to [facility], please contact our office at [phone] between the hours of 8:30 a.m. and 4:30 p.m.  Voicemails left after 4:30 p.m. will not be returned until the following business day.  For prescription refill requests, have your pharmacy contact our office.       Resources For Cancer Patients and their Caregivers ? American Cancer Society: Can assist with transportation, wigs, general needs, runs Look Good Feel Better.        209-233-1455 ? Cancer Care: Provides financial assistance, online support groups, medication/co-pay assistance.  1-800-813-HOPE 272-186-6180) ? [place] Assists [place] cancer  patients and their families through emotional , educational and financial support.  706-467-8953 ? [place] DSS Where to apply for food stamps, Medicaid and utility assistance. 301-450-0115 ? [employer]: Transportation to medical appointments. 782 187 9630 ? Social Security Administration: May apply for disability if have a Stage IV cancer. (660)340-9377 316-525-7291 ? [employer], Disability and Transit Services: Assists with nutrition, care and transit needs. [place] Support Programs: @10RELATIVEDAYS @ > Cancer Support Group  2nd Tuesday of the month 1pm-2pm, Journey Room  > Creative Journey  3rd Tuesday of the month 1130am-1pm, Journey Room  > Look Good Feel Better  1st Wednesday of the month 10am-12 noon, Journey Room (Call [place] to register [phone])

## 2017-05-26 DIAGNOSIS — L089 Local infection of the skin and subcutaneous tissue, unspecified: Secondary | ICD-10-CM | POA: Diagnosis not present

## 2017-06-02 ENCOUNTER — Telehealth (HOSPITAL_COMMUNITY): Payer: Self-pay | Admitting: Physical Therapy

## 2017-06-02 DIAGNOSIS — L089 Local infection of the skin and subcutaneous tissue, unspecified: Secondary | ICD-10-CM | POA: Diagnosis not present

## 2017-06-02 DIAGNOSIS — L97921 Non-pressure chronic ulcer of unspecified part of left lower leg limited to breakdown of skin: Secondary | ICD-10-CM | POA: Diagnosis not present

## 2017-06-02 NOTE — Telephone Encounter (Signed)
[name] called from Dr. Delanna Ahmadi office - He wants this  one wound on lower legsee tomorrow 06/03/17. We only had this apptment availalbe, [name] will fax order or call me back to cx. NF 06/02/17

## 2017-06-03 ENCOUNTER — Encounter (HOSPITAL_COMMUNITY): Payer: Medicare Other | Attending: Oncology

## 2017-06-03 ENCOUNTER — Encounter (HOSPITAL_COMMUNITY): Payer: Self-pay

## 2017-06-03 ENCOUNTER — Encounter (HOSPITAL_COMMUNITY): Payer: Medicare Other

## 2017-06-03 VITALS — BP 158/41 | HR 61 | Temp 98.2°F | Resp 18

## 2017-06-03 DIAGNOSIS — D631 Anemia in chronic kidney disease: Secondary | ICD-10-CM

## 2017-06-03 DIAGNOSIS — N189 Chronic kidney disease, unspecified: Secondary | ICD-10-CM

## 2017-06-03 DIAGNOSIS — N184 Chronic kidney disease, stage 4 (severe): Principal | ICD-10-CM

## 2017-06-03 DIAGNOSIS — N185 Chronic kidney disease, stage 5: Secondary | ICD-10-CM

## 2017-06-03 DIAGNOSIS — N058 Unspecified nephritic syndrome with other morphologic changes: Secondary | ICD-10-CM

## 2017-06-03 LAB — CBC WITH DIFFERENTIAL/PLATELET
Basophils Absolute: 0 10*3/uL (ref 0.0–0.1)
Basophils Relative: 0 %
EOS PCT: 2 %
Eosinophils Absolute: 0.1 10*3/uL (ref 0.0–0.7)
HEMATOCRIT: 27.1 % — AB (ref 36.0–46.0)
Hemoglobin: 8.4 g/dL — ABNORMAL LOW (ref 12.0–15.0)
LYMPHS ABS: 0.8 10*3/uL (ref 0.7–4.0)
LYMPHS PCT: 14 %
MCH: 26.9 pg (ref 26.0–34.0)
MCHC: 31 g/dL (ref 30.0–36.0)
MCV: 86.9 fL (ref 78.0–100.0)
MONO ABS: 0.3 10*3/uL (ref 0.1–1.0)
Monocytes Relative: 6 %
NEUTROS ABS: 4.8 10*3/uL (ref 1.7–7.7)
Neutrophils Relative %: 78 %
PLATELETS: 202 10*3/uL (ref 150–400)
RBC: 3.12 MIL/uL — AB (ref 3.87–5.11)
RDW: 17.5 % — ABNORMAL HIGH (ref 11.5–15.5)
WBC: 6 10*3/uL (ref 4.0–10.5)

## 2017-06-03 MED ORDER — DARBEPOETIN ALFA 300 MCG/0.6ML IJ SOSY
300.0000 ug | PREFILLED_SYRINGE | Freq: Once | INTRAMUSCULAR | Status: AC
  Administered 2017-06-03: 300 ug via SUBCUTANEOUS

## 2017-06-03 MED ORDER — DARBEPOETIN ALFA 300 MCG/0.6ML IJ SOSY
PREFILLED_SYRINGE | INTRAMUSCULAR | Status: AC
  Filled 2017-06-03: qty 0.6

## 2017-06-03 NOTE — Progress Notes (Signed)
[name] presents today for injection per the provider's orders.  Aranesp administration without incident; see MAR for injection details.  Patient tolerated procedure well and without incident.  No questions or complaints noted at this time.  Discharged via wheelchair in c/o spouse.

## 2017-06-04 ENCOUNTER — Ambulatory Visit (HOSPITAL_COMMUNITY): Payer: Medicare Other | Attending: Family Medicine | Admitting: Physical Therapy

## 2017-06-04 DIAGNOSIS — R262 Difficulty in walking, not elsewhere classified: Secondary | ICD-10-CM | POA: Diagnosis not present

## 2017-06-04 DIAGNOSIS — M79605 Pain in left leg: Secondary | ICD-10-CM | POA: Diagnosis not present

## 2017-06-04 DIAGNOSIS — R609 Edema, unspecified: Secondary | ICD-10-CM | POA: Diagnosis not present

## 2017-06-04 DIAGNOSIS — S81802D Unspecified open wound, left lower leg, subsequent encounter: Secondary | ICD-10-CM | POA: Diagnosis not present

## 2017-06-04 DIAGNOSIS — X58XXXD Exposure to other specified factors, subsequent encounter: Secondary | ICD-10-CM | POA: Insufficient documentation

## 2017-06-04 NOTE — Patient Instructions (Signed)
Dressing should remain dry.  Do not remove unless it is necessary.  Remove if dressing becomes wet or is painful.

## 2017-06-04 NOTE — Therapy (Signed)
[place], [place], [address] Phone: [phone]   Fax:  [phone]  Wound Care Evaluation  Patient Details  Name: [name] MRN: [id number] Date of Birth: 1946/05/11 Referring Provider: Sharilyn Little  Encounter Date: 06/04/2017      PT End of Session - 06/04/17 1423    Visit Number 1   Number of Visits 12   Date for PT Re-Evaluation 07/04/17   Authorization Type UHC medicare   Authorization - Visit Number 1   Authorization - Number of Visits 10   PT Start Time 1430   PT Stop Time 1510   PT Time Calculation (min) 40 min   Activity Tolerance Patient tolerated treatment well      Past Medical History:  Diagnosis Date  . Anemia   . Asthma   . Atrial flutter [facility])    TEE/DCCV December 2013 - on Xarelto [facility])  . Bone spur of toe    Right 5th toe  . CKD (chronic kidney disease) stage 3, GFR 30-59 ml/min   . DVT of upper extremity (deep vein thrombosis) ([place])    Right basilic vein, June 3154  . Essential hypertension, benign   . SLE (systemic lupus erythematosus) ([place])   . Type 2 diabetes mellitus ([place])   . UTI (lower urinary tract infection)     Past Surgical History:  Procedure Laterality Date  . ABDOMINAL HYSTERECTOMY  1983  . ANKLE SURGERY  1993  . AV FISTULA PLACEMENT Left 03/27/2014   Procedure: ARTERIOVENOUS (AV) FISTULA CREATION;  Surgeon: Dana Posner, MD;  Location: [place];  Service: Vascular;  Laterality: Left;  . BACK SURGERY  1980  . CARDIOVASCULAR STRESS TEST  12/19/2009   no stress induced rhythm abnormalities, ekg negative for ischemia  . CARDIOVERSION  09/16/2012   Procedure: CARDIOVERSION;  Surgeon: Dana Klein, MD;  Location: [facility] ENDOSCOPY;  Service: Cardiovascular;  Laterality: N/A;  . COLONOSCOPY  09/20/2012   Dr. Cristina Little: multiple tubular adenomas   . COLONOSCOPY  2005   Dr. Gala Little. Polyps, path unknown   . COLONOSCOPY N/A 07/24/2016   Procedure: COLONOSCOPY;   Surgeon: Dana Dolin, MD;  [facility];  Service: Endoscopy;  Laterality: N/A;  9:00 am  . ESOPHAGOGASTRODUODENOSCOPY  09/20/2012   Dr. Cristina Little: duodenal erosion and possible resolving ulcer at the angularis   . TEE WITHOUT CARDIOVERSION  09/16/2012   Procedure: TRANSESOPHAGEAL ECHOCARDIOGRAM (TEE);  Surgeon: Dana Klein, MD;  [facility] ENDOSCOPY;  Service: Cardiovascular;  Laterality: N/A;  . TRANSESOPHAGEAL ECHOCARDIOGRAM WITH CARDIOVERSION  09/16/2012   EF 60-65%, moderate LVH, moderate regurg of the aortic valve, LA moderately dilated    There were no vitals filed for this visit.        [name] PT Assessment - 06/04/17 0001      Assessment   Medical Diagnosis Non-healing Lt LE wound    Referring Provider [name]   Onset Date/Surgical Date 05/19/17   Next MD Visit unknown   Prior Therapy self care; 2 antibiotics      Precautions   Precautions None     Restrictions   Weight Bearing Restrictions No     Balance Screen   Has the patient fallen in the past 6 months No   Has the patient had a decrease in activity level because of a fear of falling?  No   Is the patient reluctant to leave their home because of a fear of falling?  No  [place] residence     Prior Function   Level of Independence Independent     Cognition   Overall Cognitive Status Within Functional Limits for tasks assessed         Wound Therapy - 06/04/17 1415    Subjective Pt states that she does not know what happened.  A blister appeared on the outside of her left leg and left a wound that will not heal.  She has been given two bouts of antibiotics as well as an antibiotic cream by the MD.  She is now being referred to skilled outpaitent therapy to assist in the healing of the wound    Patient and Family Stated Goals wound to heal    Prior Treatments self care, antibiotics    Pain Assessment 0-10   Pain Score 8    Pain Type Acute pain    Pain Location Leg   Pain Orientation Left   Pain Descriptors / Indicators Aching;Throbbing   Pain Onset With Activity   Patients Stated Pain Goal 0   Pain Intervention(s) Emotional support   Multiple Pain Little No   Evaluation and Treatment Procedures Explained to Patient/Family Yes   Evaluation and Treatment Procedures agreed to   Wound Properties Date First Assessed: 06/04/17 Time First Assessed: 6578 Wound Type: Non-pressure wound Location: Leg Location Orientation: Left;Lateral;Lower Present on Admission: Yes   Dressing Type None   Dressing Changed New   Dressing Status None   Dressing Change Frequency PRN   Site / Wound Assessment Pale   % Wound base Red or Granulating 0%   % Wound base Yellow/Fibrinous Exudate 85%   % Wound base Black/Eschar 15%   Peri-wound Assessment Edema;Erythema (blanchable)  redness has a 6 cm perimeter.    Wound Length (cm) 4.4 cm   Wound Width (cm) 4.3 cm   Drainage Amount --  no dressing per pt wound is draining quite a bit.     Drainage Description Serous   Treatment Cleansed;Debridement (Selective);Other (Comment)  pressure dressing    Wound Properties Date First Assessed: 06/04/17 Time First Assessed: 1436 Wound Type: Diabetic ulcer Location: Leg Location Orientation: Left;Anterior Present on Admission: Yes   Dressing Type None   Dressing Changed New   Dressing Status None   Dressing Change Frequency PRN   Site / Wound Assessment Granulation tissue   % Wound base Red or Granulating 100%   Wound Length (cm) 1 cm   Wound Width (cm) 1.3 cm   Drainage Amount Scant   Treatment Cleansed;Debridement (Selective) with poor tolerance due to increased pain;  dressing change with compression dressing,(profore)   Selective Debridement - Location --  both wounds   Selective Debridement - Tools Used Forceps;Scalpel   Selective Debridement - Tissue Removed slough    Wound Therapy - Clinical Statement [name] is a 71 yo with history of diabetes,  lupus, stage 3 kidney disease who has had a non healing wound on her LT LE for the past several weeks.  She has been dressing the wound herself and taking antibiotics with no healing present.  She is now being referred and would benefit from skilled physical therapy to create an environment for successful wound healing.  She has some edema and evidence of venous insufficency via varicosities however she also states that her leg feels better down rather than up.  We will try a trial of profore lite but the pt was verbally told three times if her pain  increased with the dressing she is to take the first layer off.   We will continue to attempt debridement to improve granulation of the lateral leg wound but pt had poor tolerance to this today.    Wound Therapy - Functional Problem List difficulty in walking    Factors Delaying/Impairing Wound Healing Altered sensation;Diabetes Mellitus;Infection - systemic/local;Multiple medical problems;Polypharmacy;Vascular compromise   Hydrotherapy Plan Debridement;Dressing change;Patient/family education   Wound Therapy - Frequency --  2x a week for 6 weeks    Wound Therapy - Current Recommendations PT   Wound Plan Continue to see twice a week for 6 weeks for debridement and dressing change. If pt still has redness around her wound request a wound culture as pt has already been on two antibiotics without improvement.  Assess if the profore lite was tolerable for the patient.     Dressing  anterior wound xeroform followed by 4x4 and profore lite    Dressing lateral wound silverhydrofiber followed by 4x4 and profore lite             Objective measurements completed on examination: See above findings.                PT Education - 06/04/17 1423    Education provided Yes   Education Details see above   Person(s) Educated Patient   Methods Explanation   Comprehension Verbalized understanding          PT Short Term Goals - 06/04/17 1524       PT SHORT TERM GOAL #1   Title Pt to understand signs and symptoms of cellulits and the importance of contacting MD if this occurs    Time 1   Period Weeks   Status New   Target Date 06/11/17     PT SHORT TERM GOAL #2   Title PT lateral wound to be 75% granulated and to have no black eschar to reduce the risk of infection.   Time 2   Period Weeks   Status New   Target Date 06/18/17     PT SHORT TERM GOAL #3   Title Pt pain to be decreased to no greater than a 4/10 to allow pt to be able to walk for 10 mintues without increased pain. 3   Time 3   Period Weeks   Status New   Target Date 06/25/17           PT Long Term Goals - 06/04/17 1527      PT LONG TERM GOAL #1   Title Pt wound to be 100% granulated to prevent infection    Time 4   Period Weeks   Status New   Target Date 07/02/17     PT LONG TERM GOAL #2   Title PT lateral  wound size to be no greater than 2x2 to allow pt to feel comfortable in self care of wound ; anterior wound to be healed    Time 6   Period Weeks   Status New   Target Date 07/09/17     PT LONG TERM GOAL #3   Title If profore felt comfortable to pt pt to be educated in compression garments and why they may be benefical for pt.  Pt has had wounds come up on her Rt leg in the past as well    Time 6   Period Weeks              Plan - 06/04/17 1424    Clinical  Impression Statement see above    History and Personal Factors relevant to plan of care: lupus, DM, DVT , HTN, stage 3 kidneydz,    Clinical Presentation Unstable   Clinical Decision Making Moderate   Rehab Potential Good   PT Frequency 2x / week   PT Duration 6 weeks   PT Treatment/Interventions Other (comment)  debridement and dressing change    PT Next Visit Plan Assess how dressing choice affects wound.  Continue with debridement and dressing change       Patient will benefit from skilled therapeutic intervention in order to improve the following deficits and impairments:   Decreased activity tolerance, Increased edema, Difficulty walking, Pain  Visit Diagnosis: Pain in left leg  Difficulty in walking, not elsewhere classified  Leg wound, left, subsequent encounter      G-Codes - 06-08-2017 1530    Functional Assessment Tool Used (Outpatient Only) clinical judgement:  wound size, granulation    Functional Limitation Other PT primary   Other PT Primary Current Status (O2947) At least 60 percent but less than 80 percent impaired, limited or restricted   Other PT Primary Goal Status (M5465) At least 1 percent but less than 20 percent impaired, limited or restricted      Problem List Patient Active Problem List   Diagnosis Date Noted  . Heme positive stool 10/04/2015  . Hyperlipidemia 06/29/2015  . Type 2 diabetes mellitus with stage 4 chronic kidney disease ([place]) 06/29/2015  . Encounter for adequacy testing for hemodialysis ([place]) 04/12/2014  . Chronic kidney disease, stage V ([place]) 04/12/2014  . 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 Encounter for therapeutic drug monitoring 03/30/2014  . Chronic diastolic heart failure ([place]) 03/15/2014  . Renal failure (ARF), acute on chronic (HCC) 03/13/2014  . Anemia of chronic renal failure, stage 4 (severe) ([place]) 02/23/2014  . Folate deficiency 02/17/2014  . Aortic regurgitation 09/19/2012  . Atrial flutter ([place]) 09/14/2012  . Morbid obesity ([place]) 09/14/2012  . Type 2 diabetes mellitus with diabetic neuropathy ([place]) 09/14/2012  . SLE (systemic lupus erythematosus) ([place]) 09/14/2012  . Essential hypertension, benign 09/14/2012  . COPD (chronic obstructive pulmonary disease) ([place]) 09/14/2012    [name], PT CLT [phone] 06/08/2017, 3:31 PM  [place] [address] [place], [place], [address] Phone: [phone]   Fax:  [phone]  Name: TIONNE CARELLI MRN: [id number] Date of Birth:  12/02/1945

## 2017-06-08 ENCOUNTER — Ambulatory Visit (INDEPENDENT_AMBULATORY_CARE_PROVIDER_SITE_OTHER): Payer: Medicare Other | Admitting: *Deleted

## 2017-06-08 ENCOUNTER — Other Ambulatory Visit: Payer: Self-pay | Admitting: "Endocrinology

## 2017-06-08 DIAGNOSIS — I4892 Unspecified atrial flutter: Secondary | ICD-10-CM

## 2017-06-08 DIAGNOSIS — Z5181 Encounter for therapeutic drug level monitoring: Secondary | ICD-10-CM | POA: Diagnosis not present

## 2017-06-08 LAB — POCT INR: INR: 4.5

## 2017-06-10 ENCOUNTER — Ambulatory Visit (HOSPITAL_COMMUNITY): Payer: Medicare Other

## 2017-06-10 DIAGNOSIS — R262 Difficulty in walking, not elsewhere classified: Secondary | ICD-10-CM | POA: Diagnosis not present

## 2017-06-10 DIAGNOSIS — M79605 Pain in left leg: Secondary | ICD-10-CM | POA: Diagnosis not present

## 2017-06-10 DIAGNOSIS — R609 Edema, unspecified: Secondary | ICD-10-CM | POA: Diagnosis not present

## 2017-06-10 DIAGNOSIS — S81802D Unspecified open wound, left lower leg, subsequent encounter: Secondary | ICD-10-CM | POA: Diagnosis not present

## 2017-06-10 NOTE — Therapy (Signed)
[place], [place], [address] Phone: [phone]   Fax:  [phone]  Wound Care Therapy  Patient Details  Name: [name] MRN: [id number] Date of Birth: April 25, 1946 Referring Provider: Sharilyn Sites  Encounter Date: 06/10/2017      PT End of Session - 06/10/17 1250    Visit Number 2   Number of Visits 12   Date for PT Re-Evaluation 07/04/17   Authorization Type UHC medicare   Authorization - Visit Number 2   Authorization - Number of Visits 10   PT Start Time 1120   PT Stop Time 1158   PT Time Calculation (min) 38 min   Activity Tolerance Patient tolerated treatment well   Behavior During Therapy [facility] for tasks assessed/performed      Past Medical History:  Diagnosis Date  . Anemia   . Asthma   . Atrial flutter [facility])    TEE/DCCV December 2013 - on Xarelto [facility])  . Bone spur of toe    Right 5th toe  . CKD (chronic kidney disease) stage 3, GFR 30-59 ml/min   . DVT of upper extremity (deep vein thrombosis) ([place])    Right basilic vein, June 3546  . Essential hypertension, benign   . SLE (systemic lupus erythematosus) ([place])   . Type 2 diabetes mellitus ([place])   . UTI (lower urinary tract infection)     Past Surgical History:  Procedure Laterality Date  . ABDOMINAL HYSTERECTOMY  1983  . ANKLE SURGERY  1993  . AV FISTULA PLACEMENT Left 03/27/2014   Procedure: ARTERIOVENOUS (AV) FISTULA CREATION;  Surgeon: Rosetta Posner, MD;  Location: [place];  Service: Vascular;  Laterality: Left;  . BACK SURGERY  1980  . CARDIOVASCULAR STRESS TEST  12/19/2009   no stress induced rhythm abnormalities, ekg negative for ischemia  . CARDIOVERSION  09/16/2012   Procedure: CARDIOVERSION;  Surgeon: Sanda Klein, MD;  Location: [facility] ENDOSCOPY;  Service: Cardiovascular;  Laterality: N/A;  . COLONOSCOPY  09/20/2012   Dr. [name]: multiple tubular adenomas   . COLONOSCOPY  2005   Dr. [name]. Polyps, path unknown   .  COLONOSCOPY N/A 07/24/2016   Procedure: COLONOSCOPY;  Surgeon: [name], MD;  [facility];  Service: Endoscopy;  Laterality: N/A;  9:00 am  . ESOPHAGOGASTRODUODENOSCOPY  09/20/2012   Dr. [name]: duodenal erosion and possible resolving ulcer at the angularis   . TEE WITHOUT CARDIOVERSION  09/16/2012   Procedure: TRANSESOPHAGEAL ECHOCARDIOGRAM (TEE);  Surgeon: Sanda Klein, MD;  Location: [facility] ENDOSCOPY;  Service: Cardiovascular;  Laterality: N/A;  . TRANSESOPHAGEAL ECHOCARDIOGRAM WITH CARDIOVERSION  09/16/2012   EF 60-65%, moderate LVH, moderate regurg of the aortic valve, LA moderately dilated    There were no vitals filed for this visit.       Subjective Assessment - 06/10/17 1203    Subjective Pt stated dressings slid down with increased pain due to tightness, pain intermittent form 0-8/10                   Wound Therapy - 06/10/17 1206    Subjective Pt stated dressings slid down with increased pain due to tightness, pain intermittent form 0-8/10   Patient and Family Stated Goals wound to heal    Prior Treatments self care, antibiotics    Pain Assessment 0-10   Pain Score 8    Pain Type Acute pain   Pain Location Leg   Pain Orientation Left   Pain  Descriptors / Indicators Throbbing;Aching   Pain Onset With Activity   Patients Stated Pain Goal 0   Pain Intervention(s) Emotional support   Multiple Pain Sites No   Evaluation and Treatment Procedures Explained to Patient/Family Yes   Evaluation and Treatment Procedures agreed to   Wound Properties Date First Assessed: 06/04/17 Time First Assessed: 6237 Wound Type: Non-pressure wound Location: Leg Location Orientation: Left;Lateral;Lower Present on Admission: Yes   Dressing Type Silver hydrofiber;Gauze (Comment);Compression wrap  silverhydrofiber, 4x4, profore lite   Dressing Changed Changed   Dressing Status Clean;Dry;Intact;Old drainage   Dressing Change Frequency PRN   Site / Wound Assessment  Pale   % Wound base Red or Granulating 10%   % Wound base Yellow/Fibrinous Exudate 85%  biofilm and slough   % Wound base Black/Eschar 5%   Peri-wound Assessment Edema;Erythema (blanchable)   Wound Length (cm) 4.1 cm   Wound Width (cm) 4.2 cm   Wound Depth (cm) 0 cm   Wound Volume (cm^3) 0 cm^3   Wound Surface Area (cm^2) 17.22 cm^2   Drainage Amount Minimal   Drainage Description Serous   Treatment Cleansed;Debridement (Selective)   Wound Properties Date First Assessed: 06/04/17 Time First Assessed: 6283 Wound Type: Diabetic ulcer Location: Leg Location Orientation: Left;Anterior Present on Admission: Yes   Dressing Type Impregnated gauze (bismuth);Gauze (Comment);Compression wrap  xeroform, 4x4, profore lite   Dressing Changed Changed   Dressing Status Clean;Dry;Intact;Old drainage   Dressing Change Frequency PRN   Site / Wound Assessment Granulation tissue   % Wound base Red or Granulating 100%   % Wound base Yellow/Fibrinous Exudate 0%   % Wound base Black/Eschar 0%   Drainage Amount Scant   Drainage Description Serous   Treatment Cleansed;Debridement (Selective)   Selective Debridement - Location anterior and lateral calf   Selective Debridement - Tools Used Forceps;Scalpel   Selective Debridement - Tissue Removed slough and biofilm from lateral wound   Wound Therapy - Clinical Statement Pt arrived with reports of dressings sliding down LE wiht increased pain, removed the outer layer.  Selective debridement for removal of slough and biofilm.  Continued with xeroform on anterior and silverhydrofiber on lateral wound.  Continued trial with profore lite to address edema, used medipore tape for adherent on top with trial of profore lite for edema control.   Wound Therapy - Functional Problem List difficulty in walking    Factors Delaying/Impairing Wound Healing Altered sensation;Diabetes Mellitus;Infection - systemic/local;Multiple medical problems;Polypharmacy;Vascular compromise    Hydrotherapy Plan Debridement;Dressing change;Patient/family education   Wound Therapy - Frequency --  2x/week for 6 weeks   Wound Therapy - Current Recommendations PT   Wound Plan Continue to see twice a week for 6 weeks for debridement and dressing change. If pt still has redness around her wound request a wound culture as pt has already been on two antibiotics without improvement.  Assess if the profore lite was tolerable for the patient.     Dressing  anterior wound xeroform followed by 4x4 and profore lite    Dressing lateral wound silverhydrofiber followed by 4x4 and profore lite                    PT Short Term Goals - 06/04/17 1524      PT SHORT TERM GOAL #1   Title Pt to understand signs and symptoms of cellulits and the importance of contacting MD if this occurs    Time 1   Period Weeks   Status New  Target Date 06/11/17     PT SHORT TERM GOAL #2   Title PT lateral wound to be 75% granulated and to have no black eschar to reduce the risk of infection.   Time 2   Period Weeks   Status New   Target Date 06/18/17     PT SHORT TERM GOAL #3   Title Pt pain to be decreased to no greater than a 4/10 to allow pt to be able to walk for 10 mintues without increased pain. 3   Time 3   Period Weeks   Status New   Target Date 06/25/17           PT Long Term Goals - 06/04/17 1527      PT LONG TERM GOAL #1   Title Pt wound to be 100% granulated to prevent infection    Time 4   Period Weeks   Status New   Target Date 07/02/17     PT LONG TERM GOAL #2   Title PT lateral  wound size to be no greater than 2x2 to allow pt to feel comfortable in self care of wound ; anterior wound to be healed    Time 6   Period Weeks   Status New   Target Date 07/09/17     PT LONG TERM GOAL #3   Title If profore felt comfortable to pt pt to be educated in compression garments and why they may be benefical for pt.  Pt has had wounds come up on her Rt leg in the past as well     Time 6   Period Weeks             Patient will benefit from skilled therapeutic intervention in order to improve the following deficits and impairments:     Visit Diagnosis: Pain in left leg  Difficulty in walking, not elsewhere classified  Leg wound, left, subsequent encounter     Problem List Patient Active Problem List   Diagnosis Date Noted  . Heme positive stool 10/04/2015  . Hyperlipidemia 06/29/2015  . Type 2 diabetes mellitus with stage 4 chronic kidney disease ([place]) 06/29/2015  . Encounter for adequacy testing for hemodialysis ([place]) 04/12/2014  . Chronic kidney disease, stage V ([place]) 04/12/2014  . 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 Encounter for therapeutic drug monitoring 03/30/2014  . Chronic diastolic heart failure ([place]) 03/15/2014  . Renal failure (ARF), acute on chronic (HCC) 03/13/2014  . Anemia of chronic renal failure, stage 4 (severe) ([place]) 02/23/2014  . Folate deficiency 02/17/2014  . Aortic regurgitation 09/19/2012  . Atrial flutter ([place]) 09/14/2012  . Morbid obesity ([place]) 09/14/2012  . Type 2 diabetes mellitus with diabetic neuropathy ([place]) 09/14/2012  . SLE (systemic lupus erythematosus) ([place]) 09/14/2012  . Essential hypertension, benign 09/14/2012  . COPD (chronic obstructive pulmonary disease) ([place]) 09/14/2012   Ihor Austin, LPTA; [place]  Aldona Lento 06/10/2017, 12:51 PM  [place] [address] [place], [place], [address] Phone: [phone]   Fax:  [phone]  Name: CAROLENA FAIRBANK MRN: [id number] Date of Birth: 09-01-1946

## 2017-06-10 NOTE — Progress Notes (Signed)
Cardiology Office Note  Date: 06/11/2017   ID: [name], DOB [birth year], MRN [id number]  PCP: Sharilyn Sites, MD  Primary Cardiologist: Rozann Lesches, MD   Chief Complaint  Patient presents with  . PAF    History of Present Illness: [name] is a 71 y.o. female last seen in March. She presents with her husband for a routine follow-up visit. She reports no palpitations, chest pain, or syncope since last visit. He has a slowly healing ulcer on her left lower leg, follows in the wound clinic. She was recently on a course of antibiotics and her last INR was supratherapeutic.  She continues on Coumadin with follow-up in the anticoagulation clinic. She does not report any bleeding problems.  I personally reviewed her ECG today which shows sinus arrhythmia with diffuse repolarization abnormalities.  Current cardiac regimen includes Coumadin, Lopressor, Norvasc, and Lipitor.  Past Medical History:  Diagnosis Date  . Anemia   . Asthma   . Atrial flutter [facility])    TEE/DCCV December 2013 - on Xarelto [facility])  . Bone spur of toe    Right 5th toe  . CKD (chronic kidney disease) stage 3, GFR 30-59 ml/min   . DVT of upper extremity (deep vein thrombosis) ([facility])    Right basilic vein, June 7209  . Essential hypertension, benign   . SLE (systemic lupus erythematosus) ([place])   . Type 2 diabetes mellitus ([place])   . UTI (lower urinary tract infection)     Past Surgical History:  Procedure Laterality Date  . ABDOMINAL HYSTERECTOMY  1983  . ANKLE SURGERY  1993  . AV FISTULA PLACEMENT Left 03/27/2014   Procedure: ARTERIOVENOUS (AV) FISTULA CREATION;  Surgeon: Rosetta Posner, MD;  [facility];  Service: Vascular;  Laterality: Left;  . BACK SURGERY  1980  . CARDIOVASCULAR STRESS TEST  12/19/2009   no stress induced rhythm abnormalities, ekg negative for ischemia  . CARDIOVERSION  09/16/2012   Procedure: CARDIOVERSION;  Surgeon: Sanda Klein, MD;  Location: [facility]  ENDOSCOPY;  Service: Cardiovascular;  Laterality: N/A;  . COLONOSCOPY  09/20/2012   Dr. [name]: multiple tubular adenomas   . COLONOSCOPY  2005   Dr. [name]. Polyps, path unknown   . COLONOSCOPY N/A 07/24/2016   Procedure: COLONOSCOPY;  Surgeon: [name], MD;  [facility];  Service: Endoscopy;  Laterality: N/A;  9:00 am  . ESOPHAGOGASTRODUODENOSCOPY  09/20/2012   Dr. [name]: duodenal erosion and possible resolving ulcer at the angularis   . TEE WITHOUT CARDIOVERSION  09/16/2012   Procedure: TRANSESOPHAGEAL ECHOCARDIOGRAM (TEE);  Surgeon: Sanda Klein, MD;  Location: [facility] ENDOSCOPY;  Service: Cardiovascular;  Laterality: N/A;  . TRANSESOPHAGEAL ECHOCARDIOGRAM WITH CARDIOVERSION  09/16/2012   EF 60-65%, moderate LVH, moderate regurg of the aortic valve, LA moderately dilated    Current Outpatient Prescriptions  Medication Sig Dispense Refill  . acetaminophen (TYLENOL) 500 MG tablet Take 1,000 mg by mouth every 6 (six) hours as needed. For pain    . albuterol (ACCUNEB) 0.63 MG/3ML nebulizer solution Take 1 ampule by nebulization every 6 (six) hours as needed for wheezing or shortness of breath.     Marland Kitchen albuterol (PROVENTIL HFA;VENTOLIN HFA) 108 (90 BASE) MCG/ACT inhaler Inhale 2 puffs into the lungs every 6 (six) hours as needed. For shortness of breath    . amLODipine (NORVASC) 10 MG tablet Take 1 tablet (10 mg total) by mouth at bedtime. 30 tablet 0  . atorvastatin (LIPITOR) 40 MG tablet Take 20  mg by mouth at bedtime.     . brimonidine-timolol (COMBIGAN) 0.2-0.5 % ophthalmic solution Place 1 drop into the left eye daily.     . calcitRIOL (ROCALTROL) 0.25 MCG capsule Take 0.25 mcg by mouth as directed. Take on Mondays Wednesdays and Fridays    . diclofenac (VOLTAREN) 0.1 % ophthalmic solution Place 1 drop into the left eye daily. Uses at lunch time    . febuxostat (ULORIC) 40 MG tablet Take 40 mg by mouth daily.    . folic acid (FOLVITE) 1 MG tablet Take 1 mg by mouth daily.     . furosemide (LASIX) 80 MG tablet Take 160 mg by mouth 2 (two) times daily.     Marland Kitchen HUMALOG KWIKPEN 100 UNIT/ML KiwkPen INJECT UP TO 11 UNITS SUBCUTANEOUSLY BEFORE MEALS THREE TIMES DAILY. 15 mL 2  . HYDROcodone-acetaminophen (NORCO/VICODIN) 5-325 MG per tablet Take 1-2 tablets by mouth every 4 (four) hours as needed for moderate pain. 10 tablet 0  . hydroxychloroquine (PLAQUENIL) 200 MG tablet Take 200 mg by mouth as directed. Take 1 1/2 tablets daily    . Insulin Detemir (LEVEMIR FLEXTOUCH [place]) Inject 15 Units into the skin at bedtime.    . metoprolol (LOPRESSOR) 50 MG tablet TAKE ONE TABLET BY MOUTH TWICE DAILY. 60 tablet 6  . Misc Natural Products (TART CHERRY ADVANCED) CAPS Take 1 capsule by mouth 2 (two) times daily.    . Multiple Vitamins-Minerals (CENTRUM SILVER PO) Take 1 tablet by mouth daily.    . ONE TOUCH ULTRA TEST test strip TEST 4 TIMES A DAY AS DIRECTED. 150 each 2  . ONETOUCH DELICA LANCETS 16X MISC USE TO TEST 4 TIMES DAILY. 100 each 5  . predniSONE (DELTASONE) 5 MG tablet Take 5 mg by mouth daily with breakfast.    . travoprost, benzalkonium, (TRAVATAN) 0.004 % ophthalmic solution Place 1 drop into the left eye at bedtime.    Marland Kitchen UNIFINE PENTIPS 31G X 6 MM MISC USE AS DIRECTED AT BEDTIME WITH LEVEMIR AND WITH SLIDING SCALE INSULIN. 100 each 5  . Vitamin D, Ergocalciferol, (DRISDOL) 50000 units CAPS capsule TAKE 1 CAPSULE BY MOUTH ONCE WEEKLY. 12 capsule 0  . warfarin (COUMADIN) 5 MG tablet Take 1 tablet daily 180 tablet 3   No current facility-administered medications for this visit.    Allergies:  Ace inhibitors   Social History: The patient  reports that she quit smoking about 4 years ago. Her smoking use included Cigarettes. She has a 30.00 pack-year smoking history. She has never used smokeless tobacco. She reports that she does not drink alcohol or use drugs.   ROS:  Please see the history of present illness. Otherwise, complete review of systems is positive for chronic  arthritic pains.  All other systems are reviewed and negative.   Physical Exam: VS:  BP 130/68   Pulse 64   Ht 5\' 5"  (1.651 m)   Wt 210 lb (95.3 kg)   SpO2 91%   BMI 34.95 kg/m , BMI Body mass index is 34.95 kg/m.  Wt Readings from Last 3 Encounters:  06/11/17 210 lb (95.3 kg)  05/06/17 211 lb 12.8 oz (96.1 kg)  04/23/17 210 lb (95.3 kg)    General: Obese woman in no distress. HEENT: Conjunctiva and lids normal, oropharynx clear. Neck: Supple, no elevated JVP or carotid bruits, no thyromegaly. Lungs: Clear to auscultation, nonlabored breathing at rest. Cardiac: Regular rate and rhythm, no S3, 2/6 systolic murmur, no pericardial rub. Abdomen: Soft, nontender, bowel  sounds present, no guarding or rebound. Extremities: Left leg dressed, distal pulses 1-2+. Skin: Warm and dry. Musculoskeletal: No kyphosis. Neuropsychiatric: Alert and oriented x3, affect grossly appropriate.  ECG: I personally reviewed the tracing from 03/19/2016 which showed sinus rhythm with PAC, LVH with repolarization abnormalities.  Recent Labwork: 05/06/2017: ALT 57; AST 90; BUN 83; Creatinine, Ser 2.77; Potassium 4.1; Sodium 140 06/03/2017: Hemoglobin 8.4; Platelets 202   Other Studies Reviewed Today:  Echocardiogram 03/14/2014: Study Conclusions  - Left ventricle: The cavity size was normal. Wall thickness was increased increased in a pattern of mild to moderate LVH. Systolic function was normal. The estimated ejection fraction was in the range of 50% to 55%. Doppler parameters are consistent with an irreversible restrictive pattern, indicative of decreased left ventricular diastolic compliance and/or increased left atrial pressure (grade 4 diastolic dysfunction). - Aortic valve: Mildly calcified annulus. Trileaflet; mildly thickened leaflets. There was moderate regurgitation. Regurgitation pressure half-time: 324 ms. - Aortic root: The aortic root was normal in size. - Mitral valve:  Mildly calcified annulus. Moderately thickened leaflets . There was mild regurgitation. - Left atrium: The atrium was moderately dilated. - Pulmonary veins: There is systolic blunting of pulmonary vein flow suggestive of elevated LA pressure. - Right ventricle: The cavity size was mildly to moderately dilated. - Right atrium: The atrium was moderately dilated. - Tricuspid valve: There was moderate-severe regurgitation. The jet is eccentric and posterior, this may lead to underestimation of severity. The TR vena contracta measures 0.4 cm. There is systolic reversal of hepatic vein flow suggestive of severe TR. The morphological etiology of the TR is unclear from available images. - Pulmonary arteries: Systolic pressure was moderately increased. PA peak pressure: 61 mm Hg (S). Pulmonary pressure may be underestimated in the setting of significant TR. - Inferior vena cava: The vessel was dilated. The respirophasic diameter changes were blunted (< 50%), consistent with elevated central venous pressure. - Technically adequate study.  Assessment and Plan:  1. History of paroxysmal atrial flutter. She is in sinus rhythm by ECG today and continues on Lopressor as well as Coumadin. Continue to follow in the anticoagulation clinic.  2. Essential hypertension, also on Norvasc. Blood pressure is adequately controlled today.  3. CKD stage 3-4, most recent creatinine 2.7. She follows with Dr. Hilma Favors.  4. History of chronic anemia, hemoglobin recently stable at 8.4.  Current medicines were reviewed with the patient today.   Orders Placed This Encounter  Procedures  . EKG 12-Lead    Disposition: Follow-up in 6 months.  Signed, Satira Sark, MD, [facility] 06/11/2017 11:10 AM    [place] [place] [facility] [address], [place], [place] [address] Phone: [phone]; Fax: [phone]

## 2017-06-11 ENCOUNTER — Ambulatory Visit (INDEPENDENT_AMBULATORY_CARE_PROVIDER_SITE_OTHER): Payer: Medicare Other | Admitting: Cardiology

## 2017-06-11 ENCOUNTER — Encounter: Payer: Self-pay | Admitting: Cardiology

## 2017-06-11 VITALS — BP 130/68 | HR 64 | Ht 65.0 in | Wt 210.0 lb

## 2017-06-11 DIAGNOSIS — I10 Essential (primary) hypertension: Secondary | ICD-10-CM

## 2017-06-11 DIAGNOSIS — D649 Anemia, unspecified: Secondary | ICD-10-CM

## 2017-06-11 DIAGNOSIS — I4892 Unspecified atrial flutter: Secondary | ICD-10-CM

## 2017-06-11 DIAGNOSIS — N184 Chronic kidney disease, stage 4 (severe): Secondary | ICD-10-CM

## 2017-06-11 NOTE — Patient Instructions (Signed)
Your physician wants you to follow-up in: 6 months with Dr.McDowell You will receive a reminder letter in the mail two months in advance. If you don't receive a letter, please call our office to schedule the follow-up appointment.    Your physician recommends that you continue on your current medications as directed. Please refer to the Current Medication list given to you today.     If you need a refill on your cardiac medications before your next appointment, please call your pharmacy.     No testing ordered today.    Thank you for choosing [facility] !         

## 2017-06-16 ENCOUNTER — Ambulatory Visit (HOSPITAL_COMMUNITY): Payer: Medicare Other | Admitting: Physical Therapy

## 2017-06-16 ENCOUNTER — Encounter (HOSPITAL_COMMUNITY): Payer: Self-pay | Admitting: Physical Therapy

## 2017-06-16 DIAGNOSIS — R609 Edema, unspecified: Secondary | ICD-10-CM | POA: Diagnosis not present

## 2017-06-16 DIAGNOSIS — M79605 Pain in left leg: Secondary | ICD-10-CM | POA: Diagnosis not present

## 2017-06-16 DIAGNOSIS — R262 Difficulty in walking, not elsewhere classified: Secondary | ICD-10-CM

## 2017-06-16 DIAGNOSIS — S81802D Unspecified open wound, left lower leg, subsequent encounter: Secondary | ICD-10-CM

## 2017-06-16 NOTE — Therapy (Signed)
[place], [place], [address] Phone: [phone]   Fax:  [phone]  Wound Care Therapy  Patient Details  Name: [name] MRN: [id number] Date of Birth: 1946/02/04 Referring Provider: Sharilyn Sites  Encounter Date: 06/16/2017      PT End of Session - 06/16/17 1513    Visit Number 3   Number of Visits 12   Date for PT Re-Evaluation 07/04/17   Authorization Type [employer] medicare   Authorization - Visit Number 3   Authorization - Number of Visits 10   PT Start Time 1440   PT Stop Time 1512   PT Time Calculation (min) 32 min   Activity Tolerance Patient tolerated treatment well   Behavior During Therapy [facility] for tasks assessed/performed      Past Medical History:  Diagnosis Date  . Anemia   . Asthma   . Atrial flutter [facility])    TEE/DCCV December 2013 - on Xarelto [facility])  . Bone spur of toe    Right 5th toe  . CKD (chronic kidney disease) stage 3, GFR 30-59 ml/min   . DVT of upper extremity (deep vein thrombosis) ([place])    Right basilic vein, June 3220  . Essential hypertension, benign   . SLE (systemic lupus erythematosus) ([place])   . Type 2 diabetes mellitus ([place])   . UTI (lower urinary tract infection)     Past Surgical History:  Procedure Laterality Date  . ABDOMINAL HYSTERECTOMY  1983  . ANKLE SURGERY  1993  . AV FISTULA PLACEMENT Left 03/27/2014   Procedure: ARTERIOVENOUS (AV) FISTULA CREATION;  Surgeon: Rosetta Posner, MD;  Location: [place];  Service: Vascular;  Laterality: Left;  . BACK SURGERY  1980  . CARDIOVASCULAR STRESS TEST  12/19/2009   no stress induced rhythm abnormalities, ekg negative for ischemia  . CARDIOVERSION  09/16/2012   Procedure: CARDIOVERSION;  Surgeon: Sanda Klein, MD;  Location: [facility] ENDOSCOPY;  Service: Cardiovascular;  Laterality: N/A;  . COLONOSCOPY  09/20/2012   Dr. [name]: multiple tubular adenomas   . COLONOSCOPY  2005   Dr. [name]. Polyps, path unknown   .  COLONOSCOPY N/A 07/24/2016   Procedure: COLONOSCOPY;  Surgeon: [name], MD;  [facility];  Service: Endoscopy;  Laterality: N/A;  9:00 am  . ESOPHAGOGASTRODUODENOSCOPY  09/20/2012   Dr. [name]: duodenal erosion and possible resolving ulcer at the angularis   . TEE WITHOUT CARDIOVERSION  09/16/2012   Procedure: TRANSESOPHAGEAL ECHOCARDIOGRAM (TEE);  Surgeon: Sanda Klein, MD;  Location: [facility] ENDOSCOPY;  Service: Cardiovascular;  Laterality: N/A;  . TRANSESOPHAGEAL ECHOCARDIOGRAM WITH CARDIOVERSION  09/16/2012   EF 60-65%, moderate LVH, moderate regurg of the aortic valve, LA moderately dilated    There were no vitals filed for this visit.       Subjective Assessment - 06/16/17 1512    Subjective Pt arrived with dressings intact, reports intermittent pain mainly on night.   Currently in Pain? No/denies                   Wound Therapy - 06/16/17 1524    Subjective Pt arrived with dressings intact, reports intermittent pain mainly on night.   Patient and Family Stated Goals wound to heal    Prior Treatments self care, antibiotics    Pain Assessment No/denies pain   Evaluation and Treatment Procedures Explained to Patient/Family Yes   Evaluation and Treatment Procedures agreed to   Wound Properties Date First Assessed: 06/04/17  Time First Assessed: 6063 Wound Type: Non-pressure wound Location: Leg Location Orientation: Left;Lateral;Lower Present on Admission: Yes   Dressing Type Silver hydrofiber;Gauze (Comment);Compression wrap  silverhydrofiber, gauze, profore lite   Dressing Changed Changed   Dressing Status Clean;Dry;Intact;Old drainage   Dressing Change Frequency PRN   Site / Wound Assessment Pale   % Wound base Red or Granulating 25%   % Wound base Yellow/Fibrinous Exudate 70%   % Wound base Black/Eschar 5%   Peri-wound Assessment Edema;Erythema (blanchable)   Drainage Amount Minimal   Drainage Description Serous   Treatment  Cleansed;Debridement (Selective)   Wound Properties Date First Assessed: 06/04/17 Time First Assessed: 0160 Wound Type: Diabetic ulcer Location: Leg Location Orientation: Left;Anterior Present on Admission: Yes Final Assessment Date: 06/16/17   % Wound base Red or Granulating 100%   Wound Length (cm) 0 cm  was 1 cm   Wound Width (cm) 0 cm  was 1.3   Wound Surface Area (cm^2) 0 cm^2   Treatment Cleansed  wound healed, no debridment necessary   Selective Debridement - Location lateral calf   Selective Debridement - Tools Used Forceps;Scalpel   Selective Debridement - Tissue Removed slough and biofilm from lateral wound   Wound Therapy - Clinical Statement Pt arrived with dressing intact, reports improved tolerance with mediporetape assistance to reduce sliding.  Upon removal of dressings anterior wound has fulled healed and decreased swelling, no debridement necessary.  Selective debridement for removal of slough from lateral wound.   Continued with silverhydrofiber and profore lite for edema control.     Wound Therapy - Functional Problem List difficulty in walking    Factors Delaying/Impairing Wound Healing Altered sensation;Diabetes Mellitus;Infection - systemic/local;Multiple medical problems;Polypharmacy;Vascular compromise   Hydrotherapy Plan Debridement;Dressing change;Patient/family education   Wound Therapy - Frequency --  2x/week for 6 weeks   Wound Therapy - Current Recommendations PT   Wound Plan Continue to see twice a week for 6 weeks for debridement and dressing change. If pt still has redness around her wound request a wound culture as pt has already been on two antibiotics without improvement.  Assess if the profore lite was tolerable for the patient.     Dressing lateral wound silverhydrofiber followed by 4x4 and profore lite          [facility] Adult PT Treatment/Exercise - 06/16/17 0001      Lumbar Exercises: Stretches   Piriformis Stretch (P)  2 reps;20 seconds   Piriformis  Stretch Limitations (P)  seated bilaterally     Lumbar Exercises: Supine   Bridge (P)  10 reps   Straight Leg Raise (P)  10 reps     Lumbar Exercises: Sidelying   Hip Abduction (P)  10 reps     Lumbar Exercises: Prone   Other Prone Lumbar Exercises (P)  heelsqueezes 10X3"   Other Prone Lumbar Exercises (P)  press ups 10 reps     Manual Therapy   Manual Therapy (P)  Soft tissue mobilization   Manual therapy comments (P)  completed seperately from all other skilled interventions   Soft tissue mobilization (P)  to medial lumbar and into Rt hip                  PT Short Term Goals - 06/04/17 1524      PT SHORT TERM GOAL #1   Title Pt to understand signs and symptoms of cellulits and the importance of contacting MD if this occurs    Time 1   Period Weeks  Status New   Target Date 06/11/17     PT SHORT TERM GOAL #2   Title PT lateral wound to be 75% granulated and to have no black eschar to reduce the risk of infection.   Time 2   Period Weeks   Status New   Target Date 06/18/17     PT SHORT TERM GOAL #3   Title Pt pain to be decreased to no greater than a 4/10 to allow pt to be able to walk for 10 mintues without increased pain. 3   Time 3   Period Weeks   Status New   Target Date 06/25/17           PT Long Term Goals - 06/04/17 1527      PT LONG TERM GOAL #1   Title Pt wound to be 100% granulated to prevent infection    Time 4   Period Weeks   Status New   Target Date 07/02/17     PT LONG TERM GOAL #2   Title PT lateral  wound size to be no greater than 2x2 to allow pt to feel comfortable in self care of wound ; anterior wound to be healed    Time 6   Period Weeks   Status New   Target Date 07/09/17     PT LONG TERM GOAL #3   Title If profore felt comfortable to pt pt to be educated in compression garments and why they may be benefical for pt.  Pt has had wounds come up on her Rt leg in the past as well    Time 6   Period Weeks              Patient will benefit from skilled therapeutic intervention in order to improve the following deficits and impairments:     Visit Diagnosis: Pain in left leg  Difficulty in walking, not elsewhere classified  Leg wound, left, subsequent encounter     Problem List Patient Active Problem List   Diagnosis Date Noted  . Heme positive stool 10/04/2015  . Hyperlipidemia 06/29/2015  . Type 2 diabetes mellitus with stage 4 chronic kidney disease ([place]) 06/29/2015  . Encounter for adequacy testing for hemodialysis ([place]) 04/12/2014  . Chronic kidney disease, stage V ([place]) 04/12/2014  . 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 Encounter for therapeutic drug monitoring 03/30/2014  . Chronic diastolic heart failure ([place]) 03/15/2014  . Renal failure (ARF), acute on chronic (HCC) 03/13/2014  . Anemia of chronic renal failure, stage 4 (severe) ([place]) 02/23/2014  . Folate deficiency 02/17/2014  . Aortic regurgitation 09/19/2012  . Atrial flutter ([place]) 09/14/2012  . Morbid obesity ([place]) 09/14/2012  . Type 2 diabetes mellitus with diabetic neuropathy ([place]) 09/14/2012  . SLE (systemic lupus erythematosus) ([place]) 09/14/2012  . Essential hypertension, benign 09/14/2012  . COPD (chronic obstructive pulmonary disease) ([place]) 09/14/2012   Ihor Austin, [place]; [place]  Aldona Lento 06/16/2017, 5:51 PM  Center [address] [place], [place], [address] Phone: [phone]   Fax:  [phone]  Name: [name] MRN: [id number] Date of Birth: 1946/07/24

## 2017-06-17 ENCOUNTER — Other Ambulatory Visit: Payer: Self-pay | Admitting: "Endocrinology

## 2017-06-17 ENCOUNTER — Encounter (HOSPITAL_COMMUNITY): Payer: Medicare Other

## 2017-06-17 ENCOUNTER — Encounter (HOSPITAL_BASED_OUTPATIENT_CLINIC_OR_DEPARTMENT_OTHER): Payer: Medicare Other

## 2017-06-17 ENCOUNTER — Encounter (HOSPITAL_COMMUNITY): Payer: Self-pay

## 2017-06-17 VITALS — BP 130/37 | HR 63 | Temp 97.9°F | Resp 18

## 2017-06-17 DIAGNOSIS — D631 Anemia in chronic kidney disease: Secondary | ICD-10-CM | POA: Diagnosis not present

## 2017-06-17 DIAGNOSIS — N189 Chronic kidney disease, unspecified: Secondary | ICD-10-CM

## 2017-06-17 DIAGNOSIS — N058 Unspecified nephritic syndrome with other morphologic changes: Secondary | ICD-10-CM

## 2017-06-17 DIAGNOSIS — N184 Chronic kidney disease, stage 4 (severe): Principal | ICD-10-CM

## 2017-06-17 DIAGNOSIS — N185 Chronic kidney disease, stage 5: Secondary | ICD-10-CM | POA: Diagnosis not present

## 2017-06-17 LAB — CBC WITH DIFFERENTIAL/PLATELET
BASOS ABS: 0 10*3/uL (ref 0.0–0.1)
BASOS PCT: 0 %
EOS ABS: 0.1 10*3/uL (ref 0.0–0.7)
EOS PCT: 2 %
HCT: 27.8 % — ABNORMAL LOW (ref 36.0–46.0)
Hemoglobin: 8.2 g/dL — ABNORMAL LOW (ref 12.0–15.0)
Lymphocytes Relative: 17 %
Lymphs Abs: 0.8 10*3/uL (ref 0.7–4.0)
MCH: 26 pg (ref 26.0–34.0)
MCHC: 29.5 g/dL — ABNORMAL LOW (ref 30.0–36.0)
MCV: 88.3 fL (ref 78.0–100.0)
MONO ABS: 0.6 10*3/uL (ref 0.1–1.0)
Monocytes Relative: 12 %
Neutro Abs: 3.3 10*3/uL (ref 1.7–7.7)
Neutrophils Relative %: 69 %
PLATELETS: 306 10*3/uL (ref 150–400)
RBC: 3.15 MIL/uL — ABNORMAL LOW (ref 3.87–5.11)
RDW: 16.4 % — AB (ref 11.5–15.5)
WBC: 4.7 10*3/uL (ref 4.0–10.5)

## 2017-06-17 MED ORDER — DARBEPOETIN ALFA 300 MCG/0.6ML IJ SOSY
300.0000 ug | PREFILLED_SYRINGE | Freq: Once | INTRAMUSCULAR | Status: AC
  Administered 2017-06-17: 300 ug via SUBCUTANEOUS

## 2017-06-17 MED ORDER — DARBEPOETIN ALFA 300 MCG/0.6ML IJ SOSY
PREFILLED_SYRINGE | INTRAMUSCULAR | Status: AC
  Filled 2017-06-17: qty 0.6

## 2017-06-17 NOTE — Patient Instructions (Signed)
[facility]  Discharge Instructions:  You received aranesp today.  Call for any questions or concerns.  _______________________________________________________________  Thank you for choosing [facility] to provide your oncology and hematology care.  To afford each patient quality time with our providers, please arrive at least 15 minutes before your scheduled appointment.  You need to re-schedule your appointment if you arrive 10 or more minutes late.  We strive to give you quality time with our providers, and arriving late affects you and other patients whose appointments are after yours.  Also, if you no show three or more times for appointments you may be dismissed from the clinic.  Again, thank you for choosing [facility]. Our hope is that these requests will allow you access to exceptional care and in a timely manner. _______________________________________________________________  If you have questions after your visit, please contact our office at [phone] between the hours of 8:30 a.m. and 5:00 p.m. Voicemails left after 4:30 p.m. will not be returned until the following business day. _______________________________________________________________  For prescription refill requests, have your pharmacy contact our office. _______________________________________________________________  Recommendations made by the consultant and any test results will be sent to your referring physician. _______________________________________________________________ 

## 2017-06-17 NOTE — Progress Notes (Signed)
Patient tolerated aranesp in abdomen with no complaints of pain.  Administered per protocol. VSS stable with discharge and left via wheelchair with family.

## 2017-06-18 ENCOUNTER — Ambulatory Visit (HOSPITAL_COMMUNITY): Payer: Medicare Other

## 2017-06-18 ENCOUNTER — Encounter (HOSPITAL_COMMUNITY): Payer: Self-pay

## 2017-06-18 DIAGNOSIS — S81802D Unspecified open wound, left lower leg, subsequent encounter: Secondary | ICD-10-CM | POA: Diagnosis not present

## 2017-06-18 DIAGNOSIS — M79605 Pain in left leg: Secondary | ICD-10-CM

## 2017-06-18 DIAGNOSIS — R262 Difficulty in walking, not elsewhere classified: Secondary | ICD-10-CM

## 2017-06-18 DIAGNOSIS — R609 Edema, unspecified: Secondary | ICD-10-CM | POA: Diagnosis not present

## 2017-06-18 NOTE — Therapy (Signed)
[place], [place], [address] Phone: [phone]   Fax:  [phone]  Wound Care Therapy  Patient Details  Name: [name] MRN: [id number] Date of Birth: 1946-07-31 Referring Provider: Sharilyn Sites  Encounter Date: 06/18/2017      PT End of Session - 06/18/17 1706    Visit Number 4   Number of Visits 12   Date for PT Re-Evaluation 07/04/17   Authorization Type UHC medicare   Authorization - Visit Number 4   Authorization - Number of Visits 10   PT Start Time 1324   PT Stop Time 1648   PT Time Calculation (min) 41 min   Activity Tolerance Patient tolerated treatment well   Behavior During Therapy [facility] for tasks assessed/performed      Past Medical History:  Diagnosis Date  . Anemia   . Asthma   . Atrial flutter [facility])    TEE/DCCV December 2013 - on Xarelto [facility])  . Bone spur of toe    Right 5th toe  . CKD (chronic kidney disease) stage 3, GFR 30-59 ml/min   . DVT of upper extremity (deep vein thrombosis) ([place])    Right basilic vein, June 4010  . Essential hypertension, benign   . SLE (systemic lupus erythematosus) ([place])   . Type 2 diabetes mellitus ([place])   . UTI (lower urinary tract infection)     Past Surgical History:  Procedure Laterality Date  . ABDOMINAL HYSTERECTOMY  1983  . ANKLE SURGERY  1993  . AV FISTULA PLACEMENT Left 03/27/2014   Procedure: ARTERIOVENOUS (AV) FISTULA CREATION;  Surgeon: Rosetta Posner, MD;  Location: [place];  Service: Vascular;  Laterality: Left;  . BACK SURGERY  1980  . CARDIOVASCULAR STRESS TEST  12/19/2009   no stress induced rhythm abnormalities, ekg negative for ischemia  . CARDIOVERSION  09/16/2012   Procedure: CARDIOVERSION;  Surgeon: Sanda Klein, MD;  Location: [facility] ENDOSCOPY;  Service: Cardiovascular;  Laterality: N/A;  . COLONOSCOPY  09/20/2012   Dr. [name]: multiple tubular adenomas   . COLONOSCOPY  2005   Dr. [name]. Polyps, path unknown   .  COLONOSCOPY N/A 07/24/2016   Procedure: COLONOSCOPY;  Surgeon: [name], MD;  [facility];  Service: Endoscopy;  Laterality: N/A;  9:00 am  . ESOPHAGOGASTRODUODENOSCOPY  09/20/2012   Dr. [name]: duodenal erosion and possible resolving ulcer at the angularis   . TEE WITHOUT CARDIOVERSION  09/16/2012   Procedure: TRANSESOPHAGEAL ECHOCARDIOGRAM (TEE);  Surgeon: Sanda Klein, MD;  Location: [facility] ENDOSCOPY;  Service: Cardiovascular;  Laterality: N/A;  . TRANSESOPHAGEAL ECHOCARDIOGRAM WITH CARDIOVERSION  09/16/2012   EF 60-65%, moderate LVH, moderate regurg of the aortic valve, LA moderately dilated    There were no vitals filed for this visit.       Subjective Assessment - 06/18/17 1701    Subjective Pt arrived with dressing intact, reports intermittent pain mainly at night, no current pain   Currently in Pain? No/denies                   Wound Therapy - 06/18/17 1701    Subjective Pt arrived with dressing intact, reports intermittent pain mainly at night, no current pain   Patient and Family Stated Goals wound to heal    Prior Treatments self care, antibiotics    Pain Assessment No/denies pain   Wound Properties Date First Assessed: 06/04/17 Time First Assessed: 2725 Wound Type: Non-pressure wound Location: Leg Location Orientation:  Left;Lateral;Lower Present on Admission: Yes   Dressing Type --  medihoney colloid, silver hydrofiber, 4x4;ABD; profore lite   Dressing Changed Changed   Dressing Status Clean;Dry;Intact;Old drainage   Dressing Change Frequency PRN   Site / Wound Assessment Pale;Granulation tissue   % Wound base Red or Granulating 30%   % Wound base Yellow/Fibrinous Exudate 65%   % Wound base Black/Eschar 5%   Wound Length (cm) 4 cm   Wound Width (cm) 4.2 cm   Wound Depth (cm) 0 cm   Wound Volume (cm^3) 0 cm^3   Wound Surface Area (cm^2) 16.8 cm^2   Drainage Amount Minimal   Drainage Description Serous   Treatment Cleansed;Debridement  (Selective)   Selective Debridement - Location lateral calf   Selective Debridement - Tools Used Forceps;Scalpel   Selective Debridement - Tissue Removed slough and biofilm from lateral wound   Wound Therapy - Clinical Statement Pt arrived with dressing intact and reports improved tolerance with dressings.  Upon removal noted decreased overall LE edema.  Selective debridement for removal of slough and biofilm over wound to promote healing.  Added medihoney colloid with silverhydrofiber to address slough removal and increased ease with removal of dressings.     Wound Therapy - Functional Problem List difficulty in walking    Factors Delaying/Impairing Wound Healing Altered sensation;Diabetes Mellitus;Infection - systemic/local;Multiple medical problems;Polypharmacy;Vascular compromise   Hydrotherapy Plan Debridement;Dressing change;Patient/family education   Wound Therapy - Frequency --  2x/week for 6 weeks   Wound Therapy - Current Recommendations PT   Wound Plan Continue wound care wtih appropraite dressings, profore lite for edema    Dressing  lateral wound: medihoney, silverhydrofiber, 4x4, lotion on perimeter, profore lite                   PT Short Term Goals - 06/04/17 1524      PT SHORT TERM GOAL #1   Title Pt to understand signs and symptoms of cellulits and the importance of contacting MD if this occurs    Time 1   Period Weeks   Status New   Target Date 06/11/17     PT SHORT TERM GOAL #2   Title PT lateral wound to be 75% granulated and to have no black eschar to reduce the risk of infection.   Time 2   Period Weeks   Status New   Target Date 06/18/17     PT SHORT TERM GOAL #3   Title Pt pain to be decreased to no greater than a 4/10 to allow pt to be able to walk for 10 mintues without increased pain. 3   Time 3   Period Weeks   Status New   Target Date 06/25/17           PT Long Term Goals - 06/04/17 1527      PT LONG TERM GOAL #1   Title Pt wound  to be 100% granulated to prevent infection    Time 4   Period Weeks   Status New   Target Date 07/02/17     PT LONG TERM GOAL #2   Title PT lateral  wound size to be no greater than 2x2 to allow pt to feel comfortable in self care of wound ; anterior wound to be healed    Time 6   Period Weeks   Status New   Target Date 07/09/17     PT LONG TERM GOAL #3   Title If profore felt comfortable to pt pt  to be educated in compression garments and why they may be benefical for pt.  Pt has had wounds come up on her Rt leg in the past as well    Time 6   Period Weeks             Patient will benefit from skilled therapeutic intervention in order to improve the following deficits and impairments:     Visit Diagnosis: Pain in left leg  Difficulty in walking, not elsewhere classified  Leg wound, left, subsequent encounter     Problem List Patient Active Problem List   Diagnosis Date Noted  . Heme positive stool 10/04/2015  . Hyperlipidemia 06/29/2015  . Type 2 diabetes mellitus with stage 4 chronic kidney disease ([place]) 06/29/2015  . Encounter for adequacy testing for hemodialysis ([place]) 04/12/2014  . Chronic kidney disease, stage V ([place]) 04/12/2014  . 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 Encounter for therapeutic drug monitoring 03/30/2014  . Chronic diastolic heart failure ([place]) 03/15/2014  . Renal failure (ARF), acute on chronic (HCC) 03/13/2014  . Anemia of chronic renal failure, stage 4 (severe) ([place]) 02/23/2014  . Folate deficiency 02/17/2014  . Aortic regurgitation 09/19/2012  . Atrial flutter ([place]) 09/14/2012  . Morbid obesity ([place]) 09/14/2012  . Type 2 diabetes mellitus with diabetic neuropathy ([place]) 09/14/2012  . SLE (systemic lupus erythematosus) ([place]) 09/14/2012  . Essential hypertension, benign 09/14/2012  . COPD (chronic obstructive pulmonary disease) ([place]) 09/14/2012   Ihor Austin, [place];  Standard  Aldona Lento 06/18/2017, 5:07 PM  [place] [address] [place], [place], [address] Phone: [phone]   Fax:  [phone]  Name: [name] MRN: [id number] Date of Birth: 13-Oct-1945

## 2017-06-22 ENCOUNTER — Ambulatory Visit (INDEPENDENT_AMBULATORY_CARE_PROVIDER_SITE_OTHER): Payer: Medicare Other | Admitting: *Deleted

## 2017-06-22 DIAGNOSIS — Z5181 Encounter for therapeutic drug level monitoring: Secondary | ICD-10-CM

## 2017-06-22 DIAGNOSIS — I4892 Unspecified atrial flutter: Secondary | ICD-10-CM | POA: Diagnosis not present

## 2017-06-22 LAB — POCT INR: INR: 6

## 2017-06-23 ENCOUNTER — Ambulatory Visit (HOSPITAL_COMMUNITY): Payer: Medicare Other | Admitting: Physical Therapy

## 2017-06-23 DIAGNOSIS — S81802D Unspecified open wound, left lower leg, subsequent encounter: Secondary | ICD-10-CM

## 2017-06-23 DIAGNOSIS — M79605 Pain in left leg: Secondary | ICD-10-CM | POA: Diagnosis not present

## 2017-06-23 DIAGNOSIS — R609 Edema, unspecified: Secondary | ICD-10-CM | POA: Diagnosis not present

## 2017-06-23 DIAGNOSIS — R262 Difficulty in walking, not elsewhere classified: Secondary | ICD-10-CM

## 2017-06-23 NOTE — Therapy (Signed)
[place], [place], [address] Phone: [phone]   Fax:  [phone]  Wound Care Therapy  Patient Details  Name: [name] MRN: [id number] Date of Birth: 1945-11-15 Referring Provider: Sharilyn Sites  Encounter Date: 06/23/2017      PT End of Session - 06/23/17 1602    Visit Number 5   Number of Visits 12   Date for PT Re-Evaluation 07/04/17   Authorization Type UHC medicare   Authorization - Visit Number 5   Authorization - Number of Visits 10   PT Start Time 5726   PT Stop Time 1515   PT Time Calculation (min) 38 min   Activity Tolerance Patient tolerated treatment well   Behavior During Therapy [facility] for tasks assessed/performed      Past Medical History:  Diagnosis Date  . Anemia   . Asthma   . Atrial flutter [facility])    TEE/DCCV December 2013 - on Xarelto [facility])  . Bone spur of toe    Right 5th toe  . CKD (chronic kidney disease) stage 3, GFR 30-59 ml/min   . DVT of upper extremity (deep vein thrombosis) ([place])    Right basilic vein, June 2035  . Essential hypertension, benign   . SLE (systemic lupus erythematosus) ([place])   . Type 2 diabetes mellitus ([place])   . UTI (lower urinary tract infection)     Past Surgical History:  Procedure Laterality Date  . ABDOMINAL HYSTERECTOMY  1983  . ANKLE SURGERY  1993  . AV FISTULA PLACEMENT Left 03/27/2014   Procedure: ARTERIOVENOUS (AV) FISTULA CREATION;  Surgeon: Rosetta Posner, MD;  Location: [place];  Service: Vascular;  Laterality: Left;  . BACK SURGERY  1980  . CARDIOVASCULAR STRESS TEST  12/19/2009   no stress induced rhythm abnormalities, ekg negative for ischemia  . CARDIOVERSION  09/16/2012   Procedure: CARDIOVERSION;  Surgeon: Sanda Klein, MD;  Location: [facility] ENDOSCOPY;  Service: Cardiovascular;  Laterality: N/A;  . COLONOSCOPY  09/20/2012   Dr. [name]: multiple tubular adenomas   . COLONOSCOPY  2005   Dr. [name]. Polyps, path unknown   .  COLONOSCOPY N/A 07/24/2016   Procedure: COLONOSCOPY;  Surgeon: [name], MD;  [facility];  Service: Endoscopy;  Laterality: N/A;  9:00 am  . ESOPHAGOGASTRODUODENOSCOPY  09/20/2012   Dr. [name]: duodenal erosion and possible resolving ulcer at the angularis   . TEE WITHOUT CARDIOVERSION  09/16/2012   Procedure: TRANSESOPHAGEAL ECHOCARDIOGRAM (TEE);  Surgeon: Sanda Klein, MD;  Location: [facility] ENDOSCOPY;  Service: Cardiovascular;  Laterality: N/A;  . TRANSESOPHAGEAL ECHOCARDIOGRAM WITH CARDIOVERSION  09/16/2012   EF 60-65%, moderate LVH, moderate regurg of the aortic valve, LA moderately dilated    There were no vitals filed for this visit.                  Wound Therapy - 06/23/17 1557    Subjective dressing intact, only having pain at night.  No pain currently.   Patient and Family Stated Goals wound to heal    Prior Treatments self care, antibiotics    Pain Assessment No/denies pain   Wound Properties Date First Assessed: 06/04/17 Time First Assessed: 5974 Wound Type: Non-pressure wound Location: Leg Location Orientation: Left;Lateral;Lower Present on Admission: Yes   Dressing Type Gauze (Comment);Compression wrap  medihoney colloid, silver hydrofiber, 4x4;ABD; profore lite   Dressing Changed Changed   Dressing Status Clean;Dry;Intact;Old drainage   Dressing Change Frequency PRN  Site / Wound Assessment Pale;Granulation tissue   % Wound base Red or Granulating 50%   % Wound base Yellow/Fibrinous Exudate 50%   % Wound base Black/Eschar 0%   Drainage Amount Minimal   Drainage Description Serous   Treatment Cleansed;Debridement (Selective)   Selective Debridement - Location lateral calf   Selective Debridement - Tools Used Forceps;Scalpel   Selective Debridement - Tissue Removed slough and biofilm from lateral wound   Wound Therapy - Clinical Statement Pt arrived with c/o small wound on posterior Rt calf.  Inspected with noted area approximated  0.5cmX0.5cm.  Also with induration/edema in extremity.  Informed patient we would send an order over to begin treatment on area.  Able to remove more slough from wound on LE with resultant 50% granulation remaining and no eschar at this point.  continued with medhoney collioid to loosen remaining slough with hopes of changing over to xeroform next session.  Profore lite continued.   Wound Therapy - Functional Problem List difficulty in walking    Factors Delaying/Impairing Wound Healing Altered sensation;Diabetes Mellitus;Infection - systemic/local;Multiple medical problems;Polypharmacy;Vascular compromise   Hydrotherapy Plan Debridement;Dressing change;Patient/family education   Wound Therapy - Frequency --  2x/week for 6 weeks   Wound Therapy - Current Recommendations PT   Wound Plan Continue wound care wtih appropraite dressings, profore lite for edema    Dressing  lateral wound: medihoney, 4x4, lotion on perimeter, profore lite                   PT Short Term Goals - 06/04/17 1524      PT SHORT TERM GOAL #1   Title Pt to understand signs and symptoms of cellulits and the importance of contacting MD if this occurs    Time 1   Period Weeks   Status New   Target Date 06/11/17     PT SHORT TERM GOAL #2   Title PT lateral wound to be 75% granulated and to have no black eschar to reduce the risk of infection.   Time 2   Period Weeks   Status New   Target Date 06/18/17     PT SHORT TERM GOAL #3   Title Pt pain to be decreased to no greater than a 4/10 to allow pt to be able to walk for 10 mintues without increased pain. 3   Time 3   Period Weeks   Status New   Target Date 06/25/17           PT Long Term Goals - 06/04/17 1527      PT LONG TERM GOAL #1   Title Pt wound to be 100% granulated to prevent infection    Time 4   Period Weeks   Status New   Target Date 07/02/17     PT LONG TERM GOAL #2   Title PT lateral  wound size to be no greater than 2x2 to allow  pt to feel comfortable in self care of wound ; anterior wound to be healed    Time 6   Period Weeks   Status New   Target Date 07/09/17     PT LONG TERM GOAL #3   Title If profore felt comfortable to pt pt to be educated in compression garments and why they may be benefical for pt.  Pt has had wounds come up on her Rt leg in the past as well    Time 6   Period Weeks  Patient will benefit from skilled therapeutic intervention in order to improve the following deficits and impairments:     Visit Diagnosis: No diagnosis found.     Problem List Patient Active Problem List   Diagnosis Date Noted  . Heme positive stool 10/04/2015  . Hyperlipidemia 06/29/2015  . Type 2 diabetes mellitus with stage 4 chronic kidney disease ([place]) 06/29/2015  . Encounter for adequacy testing for hemodialysis ([place]) 04/12/2014  . Chronic kidney disease, stage V ([place]) 04/12/2014  . 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 Encounter for therapeutic drug monitoring 03/30/2014  . Chronic diastolic heart failure ([place]) 03/15/2014  . Renal failure (ARF), acute on chronic (HCC) 03/13/2014  . Anemia of chronic renal failure, stage 4 (severe) ([place]) 02/23/2014  . Folate deficiency 02/17/2014  . Aortic regurgitation 09/19/2012  . Atrial flutter ([place]) 09/14/2012  . Morbid obesity ([place]) 09/14/2012  . Type 2 diabetes mellitus with diabetic neuropathy ([place]) 09/14/2012  . SLE (systemic lupus erythematosus) ([place]) 09/14/2012  . Essential hypertension, benign 09/14/2012  . COPD (chronic obstructive pulmonary disease) ([place]) 09/14/2012   Teena Irani, PTA/CLT [phone]  Teena Irani 06/23/2017, 4:03 PM  [place] [address] [place], [place], [address] Phone: [phone]   Fax:  [phone]  Name: [name] MRN: [id number] Date of Birth: [birth year]

## 2017-06-25 ENCOUNTER — Ambulatory Visit (HOSPITAL_COMMUNITY): Payer: Medicare Other

## 2017-06-25 ENCOUNTER — Encounter (HOSPITAL_COMMUNITY): Payer: Self-pay

## 2017-06-25 DIAGNOSIS — R262 Difficulty in walking, not elsewhere classified: Secondary | ICD-10-CM

## 2017-06-25 DIAGNOSIS — S81802D Unspecified open wound, left lower leg, subsequent encounter: Secondary | ICD-10-CM

## 2017-06-25 DIAGNOSIS — R609 Edema, unspecified: Secondary | ICD-10-CM | POA: Diagnosis not present

## 2017-06-25 DIAGNOSIS — M79605 Pain in left leg: Secondary | ICD-10-CM | POA: Diagnosis not present

## 2017-06-25 NOTE — Therapy (Signed)
[place], [place], [address] Phone: [phone]   Fax:  [phone]  Physical Therapy Treatment  Patient Details  Name: [name] MRN: [id number] Date of Birth: [birth year] Referring Provider: Sharilyn Sites  Encounter Date: 06/25/2017      PT End of Session - 06/25/17 1142    Visit Number 6   Number of Visits 12   Date for PT Re-Evaluation 07/04/17   Authorization Type UHC medicare   Authorization - Visit Number 6   Authorization - Number of Visits 10   PT Start Time 0240   PT Stop Time 1126   PT Time Calculation (min) 49 min   Activity Tolerance Patient tolerated treatment well   Behavior During Therapy [facility] for tasks assessed/performed      Past Medical History:  Diagnosis Date  . Anemia   . Asthma   . Atrial flutter [facility])    TEE/DCCV December 2013 - on Xarelto [facility])  . Bone spur of toe    Right 5th toe  . CKD (chronic kidney disease) stage 3, GFR 30-59 ml/min   . DVT of upper extremity (deep vein thrombosis) ([place])    Right basilic vein, June 9735  . Essential hypertension, benign   . SLE (systemic lupus erythematosus) ([place])   . Type 2 diabetes mellitus ([place])   . UTI (lower urinary tract infection)     Past Surgical History:  Procedure Laterality Date  . ABDOMINAL HYSTERECTOMY  1983  . ANKLE SURGERY  1993  . AV FISTULA PLACEMENT Left 03/27/2014   Procedure: ARTERIOVENOUS (AV) FISTULA CREATION;  Surgeon: Rosetta Posner, MD;  Location: [place];  Service: Vascular;  Laterality: Left;  . BACK SURGERY  1980  . CARDIOVASCULAR STRESS TEST  12/19/2009   no stress induced rhythm abnormalities, ekg negative for ischemia  . CARDIOVERSION  09/16/2012   Procedure: CARDIOVERSION;  Surgeon: Sanda Klein, MD;  Location: [facility] ENDOSCOPY;  Service: Cardiovascular;  Laterality: N/A;  . COLONOSCOPY  09/20/2012   Dr. [name]: multiple tubular adenomas   . COLONOSCOPY  2005   Dr. [name]. Polyps, path unknown    . COLONOSCOPY N/A 07/24/2016   Procedure: COLONOSCOPY;  Surgeon: [name], MD;  [facility];  Service: Endoscopy;  Laterality: N/A;  9:00 am  . ESOPHAGOGASTRODUODENOSCOPY  09/20/2012   Dr. [name]: duodenal erosion and possible resolving ulcer at the angularis   . TEE WITHOUT CARDIOVERSION  09/16/2012   Procedure: TRANSESOPHAGEAL ECHOCARDIOGRAM (TEE);  Surgeon: Sanda Klein, MD;  Location: [facility] ENDOSCOPY;  Service: Cardiovascular;  Laterality: N/A;  . TRANSESOPHAGEAL ECHOCARDIOGRAM WITH CARDIOVERSION  09/16/2012   EF 60-65%, moderate LVH, moderate regurg of the aortic valve, LA moderately dilated    There were no vitals filed for this visit.      Subjective Assessment - 06/25/17 1253    Subjective Pt arrived with dressing intact, no reports of current pain.   Currently in Pain? No/denies                       Wound Therapy - 06/25/17 1254    Subjective Pt arrived with dressing intact, no reports of current pain.   Patient and Family Stated Goals wound to heal    Prior Treatments self care, antibiotics    Pain Assessment No/denies pain   Evaluation and Treatment Procedures Explained to Patient/Family Yes   Evaluation and Treatment Procedures agreed to   Wound Properties Date First Assessed:  06/04/17 Time First Assessed: 2979 Wound Type: Non-pressure wound Location: Leg Location Orientation: Left;Lateral;Lower Present on Admission: Yes   Dressing Type Gauze (Comment);Compression wrap  Medihoney colloid, ABD pad, profore lite   Dressing Changed Changed   Dressing Status Clean;Dry;Intact;Old drainage   Dressing Change Frequency PRN   Site / Wound Assessment Pale;Granulation tissue   % Wound base Red or Granulating 75%   % Wound base Yellow/Fibrinous Exudate 25%   % Wound base Black/Eschar 0%   Peri-wound Assessment Edema;Erythema (blanchable)   Wound Length (cm) 4 cm   Wound Width (cm) 3.8 cm  was 4.3   Wound Depth (cm) 0.1 cm   Wound Volume  (cm^3) 1.52 cm^3   Wound Surface Area (cm^2) 15.2 cm^2   Drainage Amount Minimal   Drainage Description Serous   Treatment Cleansed;Debridement (Selective)   Wound Properties Date First Assessed: 06/25/17 Time First Assessed: 1100 Wound Type: Non-pressure wound Location: Leg Location Orientation: Posterior;Medial Wound Description (Comments): blister   Dressing Type Gauze (Comment);Compression wrap  Medihoney colloid, 2x2; medipore tape profore lite   Dressing Changed New   Site / Wound Assessment Yellow   % Wound base Red or Granulating 5%   % Wound base Yellow/Fibrinous Exudate 95%   % Wound base Black/Eschar 0%   % Wound base Other/Granulation Tissue (Comment) 0%   Peri-wound Assessment Intact;Pink;Edema;Erythema (blanchable)   Wound Length (cm) 0.5 cm   Wound Width (cm) 0.6 cm   Wound Depth (cm) --  unknown   Wound Surface Area (cm^2) 0.3 cm^2   Margins Unattached edges (unapproximated)   Drainage Amount Scant   Drainage Description Serosanguineous   Treatment Cleansed;Debridement (Selective)   Selective Debridement - Location Rt posterior calf and Lt lateral calf   Selective Debridement - Tools Used Forceps;Scalpel   Selective Debridement - Tissue Removed slough and biofilm    Wound Therapy - Clinical Statement Measurement taken for new wound on posterior Rt LE.  Selective debridement complete for removal of slough and dry skin to promote healing.  Noted slight maceration around incision on Lt LE, vaseline applied to surrounding to promote healthy skin integrity.  Continued with profore lite dressings to address edema control.   Wound Therapy - Functional Problem List difficulty in walking    Factors Delaying/Impairing Wound Healing Altered sensation;Diabetes Mellitus;Infection - systemic/local;Multiple medical problems;Polypharmacy;Vascular compromise   Hydrotherapy Plan Debridement;Dressing change;Patient/family education   Wound Therapy - Frequency --  2x/ week for 6 weeks    Wound Therapy - Current Recommendations PT   Wound Plan Continue wound care wtih appropraite dressings, profore lite for edema    Dressing  Lt lateral wound: medihoney colloid, 4x4, ABD pad, lotion on perimeter, profore lite   Dressing Rt posterior calf; medihoney colloid, 4x4, medipore tape and profore lite                    PT Short Term Goals - 06/04/17 1524      PT SHORT TERM GOAL #1   Title Pt to understand signs and symptoms of cellulits and the importance of contacting MD if this occurs    Time 1   Period Weeks   Status New   Target Date 06/11/17     PT SHORT TERM GOAL #2   Title PT lateral wound to be 75% granulated and to have no black eschar to reduce the risk of infection.   Time 2   Period Weeks   Status New   Target Date 06/18/17  PT SHORT TERM GOAL #3   Title Pt pain to be decreased to no greater than a 4/10 to allow pt to be able to walk for 10 mintues without increased pain. 3   Time 3   Period Weeks   Status New   Target Date 06/25/17           PT Long Term Goals - 06/04/17 1527      PT LONG TERM GOAL #1   Title Pt wound to be 100% granulated to prevent infection    Time 4   Period Weeks   Status New   Target Date 07/02/17     PT LONG TERM GOAL #2   Title PT lateral  wound size to be no greater than 2x2 to allow pt to feel comfortable in self care of wound ; anterior wound to be healed    Time 6   Period Weeks   Status New   Target Date 07/09/17     PT LONG TERM GOAL #3   Title If profore felt comfortable to pt pt to be educated in compression garments and why they may be benefical for pt.  Pt has had wounds come up on her Rt leg in the past as well    Time 6   Period Weeks             Patient will benefit from skilled therapeutic intervention in order to improve the following deficits and impairments:     Visit Diagnosis: Pain in left leg  Difficulty in walking, not elsewhere classified  Leg wound, left,  subsequent encounter     Problem List Patient Active Problem List   Diagnosis Date Noted  . Heme positive stool 10/04/2015  . Hyperlipidemia 06/29/2015  . Type 2 diabetes mellitus with stage 4 chronic kidney disease ([place]) 06/29/2015  . Encounter for adequacy testing for hemodialysis ([place]) 04/12/2014  . Chronic kidney disease, stage V ([place]) 04/12/2014  . 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 Encounter for therapeutic drug monitoring 03/30/2014  . Chronic diastolic heart failure ([place]) 03/15/2014  . Renal failure (ARF), acute on chronic (HCC) 03/13/2014  . Anemia of chronic renal failure, stage 4 (severe) ([place]) 02/23/2014  . Folate deficiency 02/17/2014  . Aortic regurgitation 09/19/2012  . Atrial flutter ([place]) 09/14/2012  . Morbid obesity ([place]) 09/14/2012  . Type 2 diabetes mellitus with diabetic neuropathy ([place]) 09/14/2012  . SLE (systemic lupus erythematosus) ([place]) 09/14/2012  . Essential hypertension, benign 09/14/2012  . COPD (chronic obstructive pulmonary disease) ([place]) 09/14/2012   Ihor Austin, [place]; [place]  Rayetta Humphrey, PT CLT [phone] 06/25/2017, 5:25 PM  [place] [address] [place], [place], [address] Phone: [phone]   Fax:  [phone]  Name: [name] MRN: [id number] Date of Birth: Aug 05, 1946

## 2017-06-29 DIAGNOSIS — S81801A Unspecified open wound, right lower leg, initial encounter: Secondary | ICD-10-CM

## 2017-06-29 HISTORY — DX: Unspecified open wound, right lower leg, initial encounter: S81.801A

## 2017-06-30 ENCOUNTER — Ambulatory Visit (HOSPITAL_COMMUNITY): Payer: Medicare Other | Attending: Family Medicine | Admitting: Physical Therapy

## 2017-06-30 DIAGNOSIS — S81802D Unspecified open wound, left lower leg, subsequent encounter: Secondary | ICD-10-CM | POA: Insufficient documentation

## 2017-06-30 DIAGNOSIS — E782 Mixed hyperlipidemia: Secondary | ICD-10-CM | POA: Diagnosis not present

## 2017-06-30 DIAGNOSIS — I10 Essential (primary) hypertension: Secondary | ICD-10-CM | POA: Diagnosis not present

## 2017-06-30 DIAGNOSIS — M79605 Pain in left leg: Secondary | ICD-10-CM | POA: Insufficient documentation

## 2017-06-30 DIAGNOSIS — M329 Systemic lupus erythematosus, unspecified: Secondary | ICD-10-CM | POA: Diagnosis not present

## 2017-06-30 DIAGNOSIS — Z23 Encounter for immunization: Secondary | ICD-10-CM | POA: Diagnosis not present

## 2017-06-30 DIAGNOSIS — S81801D Unspecified open wound, right lower leg, subsequent encounter: Secondary | ICD-10-CM | POA: Insufficient documentation

## 2017-06-30 DIAGNOSIS — R262 Difficulty in walking, not elsewhere classified: Secondary | ICD-10-CM | POA: Insufficient documentation

## 2017-06-30 DIAGNOSIS — E1129 Type 2 diabetes mellitus with other diabetic kidney complication: Secondary | ICD-10-CM | POA: Diagnosis not present

## 2017-06-30 NOTE — Therapy (Signed)
[place], [place], [address] Phone: [phone]   Fax:  [phone]  Wound Care Therapy  Patient Details  Name: [name] MRN: [id number] Date of Birth: 01/15/1946 Referring Provider: Sharilyn Sites  Encounter Date: 06/30/2017      PT End of Session - 06/30/17 1403    Visit Number 7   Number of Visits 12   Date for PT Re-Evaluation 07/04/17   Authorization Type UHC medicare   Authorization - Visit Number 7   Authorization - Number of Visits 10   PT Start Time 1300   PT Stop Time 1350   PT Time Calculation (min) 50 min   Activity Tolerance Patient tolerated treatment well   Behavior During Therapy [facility] for tasks assessed/performed      Past Medical History:  Diagnosis Date  . Anemia   . Asthma   . Atrial flutter [facility])    TEE/DCCV December 2013 - on Xarelto [facility])  . Bone spur of toe    Right 5th toe  . CKD (chronic kidney disease) stage 3, GFR 30-59 ml/min   . DVT of upper extremity (deep vein thrombosis) ([place])    Right basilic vein, June 2952  . Essential hypertension, benign   . SLE (systemic lupus erythematosus) ([place])   . Type 2 diabetes mellitus ([place])   . UTI (lower urinary tract infection)     Past Surgical History:  Procedure Laterality Date  . ABDOMINAL HYSTERECTOMY  1983  . ANKLE SURGERY  1993  . AV FISTULA PLACEMENT Left 03/27/2014   Procedure: ARTERIOVENOUS (AV) FISTULA CREATION;  Surgeon: Rosetta Posner, MD;  Location: [place];  Service: Vascular;  Laterality: Left;  . BACK SURGERY  1980  . CARDIOVASCULAR STRESS TEST  12/19/2009   no stress induced rhythm abnormalities, ekg negative for ischemia  . CARDIOVERSION  09/16/2012   Procedure: CARDIOVERSION;  Surgeon: Sanda Klein, MD;  Location[facility] ENDOSCOPY;  Service: Cardiovascular;  Laterality: N/A;  . COLONOSCOPY  09/20/2012   Dr. [name]: multiple tubular adenomas   . COLONOSCOPY  2005   Dr. [name]. Polyps, path unknown   .  COLONOSCOPY N/A 07/24/2016   Procedure: COLONOSCOPY;  Surgeon: [name], MD;  [facility];  Service: Endoscopy;  Laterality: N/A;  9:00 am  . ESOPHAGOGASTRODUODENOSCOPY  09/20/2012   Dr. [name]: duodenal erosion and possible resolving ulcer at the angularis   . TEE WITHOUT CARDIOVERSION  09/16/2012   Procedure: TRANSESOPHAGEAL ECHOCARDIOGRAM (TEE);  Surgeon: Sanda Klein, MD;  [facility] ENDOSCOPY;  Service: Cardiovascular;  Laterality: N/A;  . TRANSESOPHAGEAL ECHOCARDIOGRAM WITH CARDIOVERSION  09/16/2012   EF 60-65%, moderate LVH, moderate regurg of the aortic valve, LA moderately dilated    There were no vitals filed for this visit.       Subjective Assessment - 06/30/17 1355    Subjective Pt arrives with husband and dressings intact, however slid down.  States she has had some aching and shooting pains in her LE's                   Wound Therapy - 06/30/17 1356    Subjective Pt arrives with husband and dressings intact, however slid down.  States she has had some aching and shooting pains in her LE's   Patient and Family Stated Goals wound to heal    Prior Treatments self care, antibiotics    Pain Assessment No/denies pain   Evaluation and Treatment Procedures Explained to  Patient/Family Yes   Evaluation and Treatment Procedures agreed to   Wound Properties Date First Assessed: 06/25/17 Time First Assessed: 1100 Wound Type: Non-pressure wound Location: Leg Location Orientation: Posterior;Medial Wound Description (Comments): blister   Dressing Type Gauze (Comment);Compression wrap   Dressing Changed New   Site / Wound Assessment Red   % Wound base Red or Granulating 40%   % Wound base Yellow/Fibrinous Exudate 60%   Peri-wound Assessment Intact;Pink;Edema;Erythema (blanchable)   Margins Unattached edges (unapproximated)   Drainage Amount Scant   Drainage Description Serosanguineous   Treatment Cleansed;Debridement (Selective)   Wound Properties  Date First Assessed: 06/04/17 Time First Assessed: 3299 Wound Type: Non-pressure wound Location: Leg Location Orientation: Left;Lateral;Lower Present on Admission: Yes   Dressing Type Gauze (Comment);Compression wrap   Dressing Changed Changed   Dressing Status Clean;Dry;Intact;Old drainage   Dressing Change Frequency PRN   Site / Wound Assessment Yellow;Red;Pink   % Wound base Red or Granulating 75%   % Wound base Yellow/Fibrinous Exudate 25%   Peri-wound Assessment Maceration   Drainage Amount Moderate   Drainage Description Serous   Treatment Cleansed;Debridement (Selective)   Selective Debridement - Location Rt posterior calf and Lt lateral calf   Selective Debridement - Tools Used Forceps   Selective Debridement - Tissue Removed slough and biofilm    Wound Therapy - Clinical Statement Dressings had slid down causing uneven compression.  Added 1/2" foam to bandaging today to keep from sliding and ensure even compression.  Changed dressings to xeroform due to maceration in Lt and improvement of granulation tissue in Rt.   Cleansed and Moisturized LE's well prior to rebandaging with profore system.  Pt reported overall comfort.    Wound Therapy - Functional Problem List difficulty in walking    Factors Delaying/Impairing Wound Healing Altered sensation;Diabetes Mellitus;Infection - systemic/local;Multiple medical problems;Polypharmacy;Vascular compromise   Hydrotherapy Plan Debridement;Dressing change;Patient/family education   Wound Plan Continue wound care wtih appropraite dressings, profore lite for edema .  Assess how foam worked to keep dressings up.   Dressing  Lt lateral wound: xeroform, 4x4, ABD pad, lotion on perimeter, profore lite   Dressing Rt posterior calf; xeroform, 4x4, medipore tape and profore lite                   PT Short Term Goals - 06/04/17 1524      PT SHORT TERM GOAL #1   Title Pt to understand signs and symptoms of cellulits and the importance of  contacting MD if this occurs    Time 1   Period Weeks   Status New   Target Date 06/11/17     PT SHORT TERM GOAL #2   Title PT lateral wound to be 75% granulated and to have no black eschar to reduce the risk of infection.   Time 2   Period Weeks   Status New   Target Date 06/18/17     PT SHORT TERM GOAL #3   Title Pt pain to be decreased to no greater than a 4/10 to allow pt to be able to walk for 10 mintues without increased pain. 3   Time 3   Period Weeks   Status New   Target Date 06/25/17           PT Long Term Goals - 06/04/17 1527      PT LONG TERM GOAL #1   Title Pt wound to be 100% granulated to prevent infection    Time 4   Period Weeks  Status New   Target Date 07/02/17     PT LONG TERM GOAL #2   Title PT lateral  wound size to be no greater than 2x2 to allow pt to feel comfortable in self care of wound ; anterior wound to be healed    Time 6   Period Weeks   Status New   Target Date 07/09/17     PT LONG TERM GOAL #3   Title If profore felt comfortable to pt pt to be educated in compression garments and why they may be benefical for pt.  Pt has had wounds come up on her Rt leg in the past as well    Time 6   Period Weeks             Patient will benefit from skilled therapeutic intervention in order to improve the following deficits and impairments:     Visit Diagnosis: Pain in left leg  Difficulty in walking, not elsewhere classified  Leg wound, left, subsequent encounter     Problem List Patient Active Problem List   Diagnosis Date Noted  . Heme positive stool 10/04/2015  . Hyperlipidemia 06/29/2015  . Type 2 diabetes mellitus with stage 4 chronic kidney disease ([place]) 06/29/2015  . Encounter for adequacy testing for hemodialysis ([place]) 04/12/2014  . Chronic kidney disease, stage V ([place]) 04/12/2014  . 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 Encounter for therapeutic drug  monitoring 03/30/2014  . Chronic diastolic heart failure ([place]) 03/15/2014  . Renal failure (ARF), acute on chronic (HCC) 03/13/2014  . Anemia of chronic renal failure, stage 4 (severe) ([place]) 02/23/2014  . Folate deficiency 02/17/2014  . Aortic regurgitation 09/19/2012  . Atrial flutter ([place]) 09/14/2012  . Morbid obesity ([place]) 09/14/2012  . Type 2 diabetes mellitus with diabetic neuropathy ([place]) 09/14/2012  . SLE (systemic lupus erythematosus) ([place]) 09/14/2012  . Essential hypertension, benign 09/14/2012  . COPD (chronic obstructive pulmonary disease) ([place]) 09/14/2012   Teena Irani, PTA/CLT [phone]  Teena Irani 06/30/2017, 2:12 PM  [place] [address] [place], [place], [address] Phone: [phone]   Fax:  [phone]  Name: [name] MRN: [id number] Date of Birth: 1946/08/18

## 2017-07-01 ENCOUNTER — Ambulatory Visit (INDEPENDENT_AMBULATORY_CARE_PROVIDER_SITE_OTHER): Payer: Medicare Other | Admitting: *Deleted

## 2017-07-01 ENCOUNTER — Encounter (HOSPITAL_COMMUNITY): Payer: Medicare Other

## 2017-07-01 ENCOUNTER — Encounter (HOSPITAL_COMMUNITY): Payer: Medicare Other | Attending: Oncology

## 2017-07-01 ENCOUNTER — Encounter (HOSPITAL_COMMUNITY): Payer: Self-pay

## 2017-07-01 VITALS — BP 154/63 | HR 66 | Temp 97.9°F | Resp 18

## 2017-07-01 DIAGNOSIS — N184 Chronic kidney disease, stage 4 (severe): Principal | ICD-10-CM

## 2017-07-01 DIAGNOSIS — D631 Anemia in chronic kidney disease: Secondary | ICD-10-CM | POA: Insufficient documentation

## 2017-07-01 DIAGNOSIS — I351 Nonrheumatic aortic (valve) insufficiency: Secondary | ICD-10-CM | POA: Diagnosis not present

## 2017-07-01 DIAGNOSIS — Z5181 Encounter for therapeutic drug level monitoring: Secondary | ICD-10-CM

## 2017-07-01 DIAGNOSIS — N185 Chronic kidney disease, stage 5: Secondary | ICD-10-CM

## 2017-07-01 DIAGNOSIS — N058 Unspecified nephritic syndrome with other morphologic changes: Secondary | ICD-10-CM

## 2017-07-01 DIAGNOSIS — I4892 Unspecified atrial flutter: Secondary | ICD-10-CM

## 2017-07-01 DIAGNOSIS — N189 Chronic kidney disease, unspecified: Secondary | ICD-10-CM

## 2017-07-01 LAB — CBC WITH DIFFERENTIAL/PLATELET
BASOS ABS: 0 10*3/uL (ref 0.0–0.1)
BASOS PCT: 0 %
EOS ABS: 0.1 10*3/uL (ref 0.0–0.7)
Eosinophils Relative: 1 %
HEMATOCRIT: 28 % — AB (ref 36.0–46.0)
HEMOGLOBIN: 8.4 g/dL — AB (ref 12.0–15.0)
Lymphocytes Relative: 16 %
Lymphs Abs: 1 10*3/uL (ref 0.7–4.0)
MCH: 25.9 pg — ABNORMAL LOW (ref 26.0–34.0)
MCHC: 30 g/dL (ref 30.0–36.0)
MCV: 86.4 fL (ref 78.0–100.0)
Monocytes Absolute: 0.3 10*3/uL (ref 0.1–1.0)
Monocytes Relative: 4 %
NEUTROS ABS: 5 10*3/uL (ref 1.7–7.7)
NEUTROS PCT: 79 %
Platelets: 242 10*3/uL (ref 150–400)
RBC: 3.24 MIL/uL — AB (ref 3.87–5.11)
RDW: 17.3 % — ABNORMAL HIGH (ref 11.5–15.5)
WBC: 6.4 10*3/uL (ref 4.0–10.5)

## 2017-07-01 LAB — POCT INR: INR: 3.4

## 2017-07-01 MED ORDER — DARBEPOETIN ALFA 300 MCG/0.6ML IJ SOSY
PREFILLED_SYRINGE | INTRAMUSCULAR | Status: AC
  Filled 2017-07-01: qty 0.6

## 2017-07-01 MED ORDER — DARBEPOETIN ALFA 300 MCG/0.6ML IJ SOSY
300.0000 ug | PREFILLED_SYRINGE | Freq: Once | INTRAMUSCULAR | Status: AC
  Administered 2017-07-01: 300 ug via SUBCUTANEOUS

## 2017-07-01 NOTE — Progress Notes (Signed)
Reviewed lab results with Fortunato Curling, NP - will change Aranesp injection frequency to weekly d/t pt's lack of response to current therapy. Pt notified and verbalizes understanding.  [name] presents today for injection per the provider's orders.  Aranesp administration without incident; see MAR for injection details.  Patient tolerated procedure well and without incident.  No questions or complaints noted at this time. Discharged via wheelchair in c/o family.

## 2017-07-02 ENCOUNTER — Ambulatory Visit (HOSPITAL_COMMUNITY): Payer: Medicare Other | Admitting: Physical Therapy

## 2017-07-02 DIAGNOSIS — R262 Difficulty in walking, not elsewhere classified: Secondary | ICD-10-CM

## 2017-07-02 DIAGNOSIS — S81801D Unspecified open wound, right lower leg, subsequent encounter: Secondary | ICD-10-CM | POA: Diagnosis not present

## 2017-07-02 DIAGNOSIS — M79605 Pain in left leg: Secondary | ICD-10-CM | POA: Diagnosis not present

## 2017-07-02 DIAGNOSIS — S81802D Unspecified open wound, left lower leg, subsequent encounter: Secondary | ICD-10-CM | POA: Diagnosis not present

## 2017-07-02 NOTE — Therapy (Signed)
[place], [place], [address] Phone: [phone]   Fax:  [phone]  Wound Care Therapy  Patient Details  Name: [name] MRN: [id number] Date of Birth: Oct 29, 1945 Referring Provider: Sharilyn Sites  Encounter Date: 07/02/2017      PT End of Session - 07/02/17 1639    Visit Number 8   Number of Visits 12   Date for PT Re-Evaluation 07/04/17   Authorization Type UHC medicare   Authorization - Visit Number 8   Authorization - Number of Visits 10   PT Start Time 0177   PT Stop Time 1115   PT Time Calculation (min) 40 min   Activity Tolerance Patient tolerated treatment well   Behavior During Therapy [facility] for tasks assessed/performed      Past Medical History:  Diagnosis Date  . Anemia   . Asthma   . Atrial flutter [facility])    TEE/DCCV December 2013 - on Xarelto [facility])  . Bone spur of toe    Right 5th toe  . CKD (chronic kidney disease) stage 3, GFR 30-59 ml/min (HCC)   . DVT of upper extremity (deep vein thrombosis) ([place])    Right basilic vein, June 9390  . Essential hypertension, benign   . SLE (systemic lupus erythematosus) ([place])   . Type 2 diabetes mellitus ([place])   . UTI (lower urinary tract infection)     Past Surgical History:  Procedure Laterality Date  . ABDOMINAL HYSTERECTOMY  1983  . ANKLE SURGERY  1993  . AV FISTULA PLACEMENT Left 03/27/2014   Procedure: ARTERIOVENOUS (AV) FISTULA CREATION;  Surgeon: Rosetta Posner, MD;  Location: [place];  Service: Vascular;  Laterality: Left;  . BACK SURGERY  1980  . CARDIOVASCULAR STRESS TEST  12/19/2009   no stress induced rhythm abnormalities, ekg negative for ischemia  . CARDIOVERSION  09/16/2012   Procedure: CARDIOVERSION;  Surgeon: Sanda Klein, MD;  Location: [facility] ENDOSCOPY;  Service: Cardiovascular;  Laterality: N/A;  . COLONOSCOPY  09/20/2012   Dr. [name]: multiple tubular adenomas   . COLONOSCOPY  2005   Dr. [name]. Polyps, path unknown    . COLONOSCOPY N/A 07/24/2016   Procedure: COLONOSCOPY;  Surgeon: [name], MD;  [facility];  Service: Endoscopy;  Laterality: N/A;  9:00 am  . ESOPHAGOGASTRODUODENOSCOPY  09/20/2012   Dr. [name]: duodenal erosion and possible resolving ulcer at the angularis   . TEE WITHOUT CARDIOVERSION  09/16/2012   Procedure: TRANSESOPHAGEAL ECHOCARDIOGRAM (TEE);  Surgeon: Sanda Klein, MD;  Location: [facility] ENDOSCOPY;  Service: Cardiovascular;  Laterality: N/A;  . TRANSESOPHAGEAL ECHOCARDIOGRAM WITH CARDIOVERSION  09/16/2012   EF 60-65%, moderate LVH, moderate regurg of the aortic valve, LA moderately dilated    There were no vitals filed for this visit.                  Wound Therapy - 07/02/17 1627    Subjective Pt continues to state that her legs feel better down.     Patient and Family Stated Goals wound to heal    Prior Treatments self care, antibiotics    Evaluation and Treatment Procedures Explained to Patient/Family Yes   Evaluation and Treatment Procedures agreed to   Wound Properties Date First Assessed: 06/25/17 Time First Assessed: 1100 Wound Type: Non-pressure wound Location: Leg Location Orientation: Posterior;Medial Wound Description (Comments): blister   Dressing Type Gauze (Comment);Compression wrap   Dressing Changed Changed   Dressing Status Dry   Dressing  Change Frequency PRN   Site / Wound Assessment Red   % Wound base Red or Granulating 0%   % Wound base Yellow/Fibrinous Exudate 100%   Peri-wound Assessment Intact;Erythema (blanchable)   Margins Unattached edges (unapproximated)   Drainage Amount Scant   Drainage Description Serosanguineous   Wound Properties Date First Assessed: 06/04/17 Time First Assessed: 0630 Wound Type: Non-pressure wound Location: Leg Location Orientation: Left;Lateral;Lower Present on Admission: Yes   Dressing Type Gauze (Comment);Compression wrap   Dressing Changed Changed   Dressing Status Old drainage   Dressing  Change Frequency PRN   Site / Wound Assessment Yellow;Red;Pink   % Wound base Red or Granulating 20%   % Wound base Yellow/Fibrinous Exudate 75%   Drainage Amount Moderate   Drainage Description Serous   Treatment Cleansed;Debridement (Selective)   Selective Debridement - Location Rt posterior calf and Lt lateral calf   Selective Debridement - Tools Used Forceps;Scalpel  Rt bladed   Selective Debridement - Tissue Removed slough and biofilm    Wound Therapy - Clinical Statement Wounds with significant decreased granulation this treatment.  Therapist stopped foam as question whether there is some arterial blockage.  MD called therapist requested an ABI be ordered.  Debridement was difficult due to patients pain level.    Wound Therapy - Functional Problem List difficulty in walking    Factors Delaying/Impairing Wound Healing Altered sensation;Diabetes Mellitus;Infection - systemic/local;Multiple medical problems;Polypharmacy;Vascular compromise   Hydrotherapy Plan Debridement;Dressing change;Patient/family education   Wound Plan Remeasure wounds    Dressing  Lt lateral wound: silverhydrofiber, 4x4,   profore lite   Dressing Rt posterior calf;,medihoney profore lite                   PT Short Term Goals - 06/04/17 1524      PT SHORT TERM GOAL #1   Title Pt to understand signs and symptoms of cellulits and the importance of contacting MD if this occurs    Time 1   Period Weeks   Status New   Target Date 06/11/17     PT SHORT TERM GOAL #2   Title PT lateral wound to be 75% granulated and to have no black eschar to reduce the risk of infection.   Time 2   Period Weeks   Status New   Target Date 06/18/17     PT SHORT TERM GOAL #3   Title Pt pain to be decreased to no greater than a 4/10 to allow pt to be able to walk for 10 mintues without increased pain. 3   Time 3   Period Weeks   Status New   Target Date 06/25/17           PT Long Term Goals - 06/04/17 1527       PT LONG TERM GOAL #1   Title Pt wound to be 100% granulated to prevent infection    Time 4   Period Weeks   Status New   Target Date 07/02/17     PT LONG TERM GOAL #2   Title PT lateral  wound size to be no greater than 2x2 to allow pt to feel comfortable in self care of wound ; anterior wound to be healed    Time 6   Period Weeks   Status New   Target Date 07/09/17     PT LONG TERM GOAL #3   Title If profore felt comfortable to pt pt to be educated in compression garments and why they may  be benefical for pt.  Pt has had wounds come up on her Rt leg in the past as well    Time 6   Period Weeks               Plan - 07/02/17 1640    Clinical Impression Statement as above    History and Personal Factors relevant to plan of care: lupus, DM, kidney dz   Rehab Potential Good   PT Frequency 2x / week   PT Duration 6 weeks   PT Treatment/Interventions Other (comment)  debridement and dressing change    PT Next Visit Plan remeasure wounds       Patient will benefit from skilled therapeutic intervention in order to improve the following deficits and impairments:  Decreased activity tolerance, Increased edema, Difficulty walking, Pain  Visit Diagnosis: Pain in left leg  Difficulty in walking, not elsewhere classified  Leg wound, left, subsequent encounter  Leg wound, right, subsequent encounter     Problem List Patient Active Problem List   Diagnosis Date Noted  . Heme positive stool 10/04/2015  . Hyperlipidemia 06/29/2015  . Type 2 diabetes mellitus with stage 4 chronic kidney disease ([place]) 06/29/2015  . Encounter for adequacy testing for hemodialysis ([place]) 04/12/2014  . Chronic kidney disease, stage V ([place]) 04/12/2014  . 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 Encounter for therapeutic drug monitoring 03/30/2014  . Chronic diastolic heart failure ([place]) 03/15/2014  . Renal failure (ARF), acute on  chronic (HCC) 03/13/2014  . Anemia of chronic renal failure, stage 4 (severe) ([place]) 02/23/2014  . Folate deficiency 02/17/2014  . Aortic regurgitation 09/19/2012  . Atrial flutter ([place]) 09/14/2012  . Morbid obesity ([place]) 09/14/2012  . Type 2 diabetes mellitus with diabetic neuropathy ([place]) 09/14/2012  . SLE (systemic lupus erythematosus) ([place]) 09/14/2012  . Essential hypertension, benign 09/14/2012  . COPD (chronic obstructive pulmonary disease) ([place]) 09/14/2012   Rayetta Humphrey, PT CLT [phone] 07/02/2017, 4:43 PM  [place] [address] [place], [place], [address] Phone: [phone]   Fax:  [phone]  Name: [name] MRN: [id number] Date of Birth: 1946-08-07

## 2017-07-03 ENCOUNTER — Telehealth (HOSPITAL_COMMUNITY): Payer: Self-pay | Admitting: Physical Therapy

## 2017-07-03 NOTE — Telephone Encounter (Signed)
Talked to [place] at [facility] requested that the MD write a order for ABI for this patient per Azucena Freed, PT.

## 2017-07-07 ENCOUNTER — Encounter (HOSPITAL_COMMUNITY): Payer: Self-pay

## 2017-07-07 ENCOUNTER — Ambulatory Visit (HOSPITAL_COMMUNITY): Payer: Medicare Other

## 2017-07-07 DIAGNOSIS — E1151 Type 2 diabetes mellitus with diabetic peripheral angiopathy without gangrene: Secondary | ICD-10-CM | POA: Diagnosis not present

## 2017-07-07 DIAGNOSIS — E1140 Type 2 diabetes mellitus with diabetic neuropathy, unspecified: Secondary | ICD-10-CM | POA: Diagnosis not present

## 2017-07-07 DIAGNOSIS — S81802D Unspecified open wound, left lower leg, subsequent encounter: Secondary | ICD-10-CM | POA: Diagnosis not present

## 2017-07-07 DIAGNOSIS — S81801D Unspecified open wound, right lower leg, subsequent encounter: Secondary | ICD-10-CM | POA: Diagnosis not present

## 2017-07-07 DIAGNOSIS — R262 Difficulty in walking, not elsewhere classified: Secondary | ICD-10-CM

## 2017-07-07 DIAGNOSIS — M79605 Pain in left leg: Secondary | ICD-10-CM

## 2017-07-07 DIAGNOSIS — L89892 Pressure ulcer of other site, stage 2: Secondary | ICD-10-CM | POA: Diagnosis not present

## 2017-07-07 NOTE — Therapy (Addendum)
[place], [place], [address] Phone: [phone]   Fax:  [phone]  Wound Care Therapy  Patient Details  Name: [name] MRN: [id number] Date of Birth: [birth year] Referring Provider: Sharilyn Sites  Encounter Date: 07/07/2017      PT End of Session - 07/07/17 1820    Visit Number 9   Number of Visits 12   Date for PT Re-Evaluation 07/04/17   Authorization Type UHC medicare   Authorization - Visit Number 9   Authorization - Number of Visits 10   PT Start Time 3419   PT Stop Time 1130   PT Time Calculation (min) 55 min   Activity Tolerance Patient tolerated treatment well   Behavior During Therapy [facility] for tasks assessed/performed      Past Medical History:  Diagnosis Date  . Anemia   . Asthma   . Atrial flutter [facility])    TEE/DCCV December 2013 - on Xarelto [facility])  . Bone spur of toe    Right 5th toe  . CKD (chronic kidney disease) stage 3, GFR 30-59 ml/min (HCC)   . DVT of upper extremity (deep vein thrombosis) ([name])    Right basilic vein, June 6222  . Essential hypertension, benign   . SLE (systemic lupus erythematosus) ([place])   . Type 2 diabetes mellitus ([place])   . UTI (lower urinary tract infection)     Past Surgical History:  Procedure Laterality Date  . ABDOMINAL HYSTERECTOMY  1983  . ANKLE SURGERY  1993  . AV FISTULA PLACEMENT Left 03/27/2014   Procedure: ARTERIOVENOUS (AV) FISTULA CREATION;  Surgeon: Rosetta Posner, MD;  Location: [place];  Service: Vascular;  Laterality: Left;  . BACK SURGERY  1980  . CARDIOVASCULAR STRESS TEST  12/19/2009   no stress induced rhythm abnormalities, ekg negative for ischemia  . CARDIOVERSION  09/16/2012   Procedure: CARDIOVERSION;  Surgeon: Sanda Klein, MD;  Location: [facility] ENDOSCOPY;  Service: Cardiovascular;  Laterality: N/A;  . COLONOSCOPY  09/20/2012   Dr. [name]: multiple tubular adenomas   . COLONOSCOPY  2005   Dr. [name]. Polyps, path unknown    . COLONOSCOPY N/A 07/24/2016   Procedure: COLONOSCOPY;  Surgeon: [name], MD;  [facility];  Service: Endoscopy;  Laterality: N/A;  9:00 am  . ESOPHAGOGASTRODUODENOSCOPY  09/20/2012   Dr. [name]: duodenal erosion and possible resolving ulcer at the angularis   . TEE WITHOUT CARDIOVERSION  09/16/2012   Procedure: TRANSESOPHAGEAL ECHOCARDIOGRAM (TEE);  Surgeon: Sanda Klein, MD;  Location: [facility] ENDOSCOPY;  Service: Cardiovascular;  Laterality: N/A;  . TRANSESOPHAGEAL ECHOCARDIOGRAM WITH CARDIOVERSION  09/16/2012   EF 60-65%, moderate LVH, moderate regurg of the aortic valve, LA moderately dilated    There were no vitals filed for this visit.       Subjective Assessment - 07/07/17 1147    Subjective Pt arrived with dressing intact, reports that have slid down leg, increased intermittent shoorting pain in LE's today.   Currently in Pain? Yes   Pain Score 9    Pain Location Leg   Pain Orientation Left   Pain Descriptors / Indicators Throbbing;Aching   Pain Type Acute pain                   Wound Therapy - 07/07/17 1147    Subjective Pt arrived with dressing intact, reports that have slid down leg, increased intermittent shoorting pain in LE's today.   Patient and Family Stated Goals  wound to heal    Prior Treatments self care, antibiotics    Pain Assessment Faces   Pain Onset With Activity   Patients Stated Pain Goal 0   Pain Intervention(s) Emotional support;Repositioned   Multiple Pain Sites No   Evaluation and Treatment Procedures Explained to Patient/Family Yes   Evaluation and Treatment Procedures agreed to   Wound Properties Date First Assessed: 06/25/17 Time First Assessed: 1100 Wound Type: Non-pressure wound Location: Leg Location Orientation: Posterior;Medial Wound Description (Comments): blister   Dressing Type Gauze (Comment);Compression wrap  honey colloid, 2x2, profore lite, netting   Dressing Changed Changed   Dressing Status Dry    Dressing Change Frequency PRN   Site / Wound Assessment Red   % Wound base Red or Granulating 30%   % Wound base Yellow/Fibrinous Exudate 70%   Peri-wound Assessment Intact;Erythema (blanchable)   Wound Length (cm) 1 cm  increased wound size due to dressing stick with removal   Wound Width (cm) 0.6 cm  was .6   Wound Depth (cm) 0.1 cm  unknown   Margins Unattached edges (unapproximated)   Drainage Amount Scant   Drainage Description Serosanguineous   Treatment Cleansed;Debridement (Selective)   Wound Properties Date First Assessed: 06/04/17 Time First Assessed: 0277 Wound Type: Non-pressure wound Location: Leg Location Orientation: Left;Lateral;Lower Present on Admission: Yes   Dressing Type Gauze (Comment);Compression wrap  silverhydrofiber, 4x4, profore lite with netting   Dressing Changed Changed   Dressing Status Old drainage   Dressing Change Frequency PRN   Site / Wound Assessment Yellow;Red;Pink   % Wound base Red or Granulating 30%   % Wound base Yellow/Fibrinous Exudate 70%   % Wound base Black/Eschar 0%   Peri-wound Assessment Maceration   Wound Length (cm) 4 cm  was 4   Wound Width (cm) 3.6 cm  was 3.8   Wound Depth (cm) 0.1 cm  was .1   Wound Volume (cm^3) 1.44 cm^3   Wound Surface Area (cm^2) 14.4 cm^2   Margins Unattached edges (unapproximated)   Drainage Amount Moderate   Drainage Description Serous   Treatment Cleansed;Debridement (Selective)  noted spot medial wound .1x.1x.1- no tx today    Selective Debridement - Location Rt posterior calf and Lt lateral calf   Selective Debridement - Tools Used Forceps;Scalpel   Selective Debridement - Tissue Removed slough and biofilm    Wound Therapy - Clinical Statement Upon removal of dressings noted increased slough and decrease overall granulation with Lt LE wound.  Selective debridement for removal of slough, biofilm and dryskin perimeter.  Pt reports no call for apt from MD concerning ABI, front desk secretary  reports conversation with MD following last session reporting need for ABI.  Measurements taken with overall decrease in size, noted small spot on medial Lt LE wound, measurements taken, no treatment to new spot.  Continued wiht profore lite for edema control with medipore tape used at top to reduce sliding as is painful to pt.  Pt will benefit from ABI to ensure proper arterial flow is getting to her legs.     Wound Therapy - Functional Problem List difficulty in walking    Factors Delaying/Impairing Wound Healing Altered sensation;Diabetes Mellitus;Infection - systemic/local;Multiple medical problems;Polypharmacy;Vascular compromise   Hydrotherapy Plan Debridement;Dressing change;Patient/family education   Wound Therapy - Frequency --  2x/week for 6 weeks Pt will need continued skilled therapy for open wound.  Recommend another 6 weeks at 2 x a week .   Wound Therapy - Current Recommendations PT  Wound Plan Continue appropraite wound care.  F/U with ABI.     Dressing  Lt lateral wound: silverhydrofiber, 4x4,   profore lite   Dressing Rt posterior calf;,medihoney profore lite        [id number] CI           PT Short Term Goals - 06/04/17 1524      PT SHORT TERM GOAL #1   Title Pt to understand signs and symptoms of cellulits and the importance of contacting MD if this occurs    Time 1   Period Weeks   Status New   Target Date 06/11/17     PT SHORT TERM GOAL #2   Title PT lateral wound to be 75% granulated and to have no black eschar to reduce the risk of infection.   Time 2   Period Weeks   Status New   Target Date 06/18/17     PT SHORT TERM GOAL #3   Title Pt pain to be decreased to no greater than a 4/10 to allow pt to be able to walk for 10 mintues without increased pain. 3   Time 3   Period Weeks   Status New   Target Date 06/25/17           PT Long Term Goals - 06/04/17 1527      PT LONG TERM GOAL #1   Title Pt wound to be 100% granulated to prevent  infection    Time 4   Period Weeks   Status New   Target Date 07/02/17     PT LONG TERM GOAL #2   Title PT lateral  wound size to be no greater than 2x2 to allow pt to feel comfortable in self care of wound ; anterior wound to be healed    Time 6   Period Weeks   Status New   Target Date 07/09/17     PT LONG TERM GOAL #3   Title If profore felt comfortable to pt pt to be educated in compression garments and why they may be benefical for pt.  Pt has had wounds come up on her Rt leg in the past as well    Time 6   Period Weeks             Patient will benefit from skilled therapeutic intervention in order to improve the following deficits and impairments:     Visit Diagnosis: Pain in left leg  Difficulty in walking, not elsewhere classified  Leg wound, left, subsequent encounter  Leg wound, right, subsequent encounter     Problem List Patient Active Problem List   Diagnosis Date Noted  . Heme positive stool 10/04/2015  . Hyperlipidemia 06/29/2015  . Type 2 diabetes mellitus with stage 4 chronic kidney disease ([place]) 06/29/2015  . Encounter for adequacy testing for hemodialysis ([place]) 04/12/2014  . Chronic kidney disease, stage V ([place]) 04/12/2014  . 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 Encounter for therapeutic drug monitoring 03/30/2014  . Chronic diastolic heart failure ([place]) 03/15/2014  . Renal failure (ARF), acute on chronic (HCC) 03/13/2014  . Anemia of chronic renal failure, stage 4 (severe) ([place]) 02/23/2014  . Folate deficiency 02/17/2014  . Aortic regurgitation 09/19/2012  . Atrial flutter ([place]) 09/14/2012  . Morbid obesity ([place]) 09/14/2012  . Type 2 diabetes mellitus with diabetic neuropathy ([place]) 09/14/2012  . SLE (systemic lupus erythematosus) ([place]) 09/14/2012  . Essential hypertension, benign 09/14/2012  . COPD (  chronic obstructive pulmonary disease) ([place]) 09/14/2012   Ihor Austin,  [place]; [place]  Rayetta Humphrey, PT CLT [phone] 07/07/2017, 6:21 PM  [facility] [address] [place], [place], [address] Phone: [phone]   Fax:  [phone]  Name: [name] MRN: [id number] Date of Birth: 02/03/1946

## 2017-07-08 ENCOUNTER — Other Ambulatory Visit (HOSPITAL_COMMUNITY): Payer: Self-pay | Admitting: Oncology

## 2017-07-08 ENCOUNTER — Encounter (HOSPITAL_COMMUNITY): Payer: Medicare Other

## 2017-07-08 ENCOUNTER — Encounter (HOSPITAL_COMMUNITY): Payer: Medicare Other | Attending: Oncology

## 2017-07-08 VITALS — BP 137/91 | HR 65 | Temp 98.2°F | Resp 20

## 2017-07-08 DIAGNOSIS — N058 Unspecified nephritic syndrome with other morphologic changes: Secondary | ICD-10-CM

## 2017-07-08 DIAGNOSIS — N185 Chronic kidney disease, stage 5: Secondary | ICD-10-CM | POA: Diagnosis not present

## 2017-07-08 DIAGNOSIS — D631 Anemia in chronic kidney disease: Secondary | ICD-10-CM

## 2017-07-08 DIAGNOSIS — N184 Chronic kidney disease, stage 4 (severe): Secondary | ICD-10-CM | POA: Diagnosis not present

## 2017-07-08 DIAGNOSIS — N189 Chronic kidney disease, unspecified: Secondary | ICD-10-CM

## 2017-07-08 LAB — CBC WITH DIFFERENTIAL/PLATELET
Basophils Absolute: 0 10*3/uL (ref 0.0–0.1)
Basophils Relative: 1 %
EOS PCT: 2 %
Eosinophils Absolute: 0.1 10*3/uL (ref 0.0–0.7)
HEMATOCRIT: 28.3 % — AB (ref 36.0–46.0)
Hemoglobin: 8.3 g/dL — ABNORMAL LOW (ref 12.0–15.0)
LYMPHS PCT: 14 %
Lymphs Abs: 0.9 10*3/uL (ref 0.7–4.0)
MCH: 25.6 pg — AB (ref 26.0–34.0)
MCHC: 29.3 g/dL — ABNORMAL LOW (ref 30.0–36.0)
MCV: 87.3 fL (ref 78.0–100.0)
MONO ABS: 0.3 10*3/uL (ref 0.1–1.0)
Monocytes Relative: 5 %
NEUTROS ABS: 5.2 10*3/uL (ref 1.7–7.7)
NEUTROS PCT: 78 %
Platelets: 290 10*3/uL (ref 150–400)
RBC: 3.24 MIL/uL — AB (ref 3.87–5.11)
RDW: 17.8 % — AB (ref 11.5–15.5)
WBC: 6.6 10*3/uL (ref 4.0–10.5)

## 2017-07-08 MED ORDER — DARBEPOETIN ALFA 300 MCG/0.6ML IJ SOSY
300.0000 ug | PREFILLED_SYRINGE | Freq: Once | INTRAMUSCULAR | Status: AC
  Administered 2017-07-08: 300 ug via SUBCUTANEOUS

## 2017-07-08 MED ORDER — DARBEPOETIN ALFA 300 MCG/0.6ML IJ SOSY
PREFILLED_SYRINGE | INTRAMUSCULAR | Status: AC
  Filled 2017-07-08: qty 0.6

## 2017-07-08 NOTE — Progress Notes (Signed)
Aranesp 349mcg given per verbal orders from Dr. Talbert Cage. Dr. Talbert Cage approved med to be given every 7 days.   [name] presents today for injection per MD orders. Aranesp 38mcg administered SQ in left Abdomen. Administration without incident. Patient tolerated well.

## 2017-07-09 ENCOUNTER — Ambulatory Visit (HOSPITAL_COMMUNITY): Payer: Medicare Other | Admitting: Physical Therapy

## 2017-07-09 ENCOUNTER — Other Ambulatory Visit (HOSPITAL_COMMUNITY): Payer: Self-pay | Admitting: Family Medicine

## 2017-07-09 DIAGNOSIS — M79605 Pain in left leg: Secondary | ICD-10-CM | POA: Diagnosis not present

## 2017-07-09 DIAGNOSIS — R262 Difficulty in walking, not elsewhere classified: Secondary | ICD-10-CM

## 2017-07-09 DIAGNOSIS — S81801D Unspecified open wound, right lower leg, subsequent encounter: Secondary | ICD-10-CM | POA: Diagnosis not present

## 2017-07-09 DIAGNOSIS — S81802D Unspecified open wound, left lower leg, subsequent encounter: Secondary | ICD-10-CM

## 2017-07-09 DIAGNOSIS — E118 Type 2 diabetes mellitus with unspecified complications: Secondary | ICD-10-CM

## 2017-07-09 NOTE — Therapy (Signed)
[place], [place], [address] Phone: [phone]   Fax:  [phone]  Wound Care Therapy  Patient Details  Name: [name] MRN: [id number] Date of Birth: [birth year] Referring Provider: Sharilyn Sites  Encounter Date: 07/09/2017      PT End of Session - 07/09/17 1334    Visit Number 10   Number of Visits 24   Date for PT Re-Evaluation 07/04/17   Authorization Type UHC medicare   Authorization - Visit Number 10   Authorization - Number of Visits 20   PT Start Time 1120   PT Stop Time 1205   PT Time Calculation (min) 45 min   Activity Tolerance Patient tolerated treatment well   Behavior During Therapy [facility] for tasks assessed/performed      Past Medical History:  Diagnosis Date  . Anemia   . Asthma   . Atrial flutter [facility])    TEE/DCCV December 2013 - on Xarelto [facility])  . Bone spur of toe    Right 5th toe  . CKD (chronic kidney disease) stage 3, GFR 30-59 ml/min (HCC)   . DVT of upper extremity (deep vein thrombosis) ([place])    Right basilic vein, June 9323  . Essential hypertension, benign   . SLE (systemic lupus erythematosus) ([place])   . Type 2 diabetes mellitus ([place])   . UTI (lower urinary tract infection)     Past Surgical History:  Procedure Laterality Date  . ABDOMINAL HYSTERECTOMY  1983  . ANKLE SURGERY  1993  . AV FISTULA PLACEMENT Left 03/27/2014   Procedure: ARTERIOVENOUS (AV) FISTULA CREATION;  Surgeon: Rosetta Posner, MD;  Location: [place];  Service: Vascular;  Laterality: Left;  . BACK SURGERY  1980  . CARDIOVASCULAR STRESS TEST  12/19/2009   no stress induced rhythm abnormalities, ekg negative for ischemia  . CARDIOVERSION  09/16/2012   Procedure: CARDIOVERSION;  Surgeon: Sanda Klein, MD;  Location: [facility] ENDOSCOPY;  Service: Cardiovascular;  Laterality: N/A;  . COLONOSCOPY  09/20/2012   Dr. [name]: multiple tubular adenomas   . COLONOSCOPY  2005   Dr. [name]. Polyps, path unknown    . COLONOSCOPY N/A 07/24/2016   Procedure: COLONOSCOPY;  Surgeon: [name], MD;  [facility];  Service: Endoscopy;  Laterality: N/A;  9:00 am  . ESOPHAGOGASTRODUODENOSCOPY  09/20/2012   Dr. [name]: duodenal erosion and possible resolving ulcer at the angularis   . TEE WITHOUT CARDIOVERSION  09/16/2012   Procedure: TRANSESOPHAGEAL ECHOCARDIOGRAM (TEE);  Surgeon: Sanda Klein, MD;  Location: [facility] ENDOSCOPY;  Service: Cardiovascular;  Laterality: N/A;  . TRANSESOPHAGEAL ECHOCARDIOGRAM WITH CARDIOVERSION  09/16/2012   EF 60-65%, moderate LVH, moderate regurg of the aortic valve, LA moderately dilated    There were no vitals filed for this visit.                  Wound Therapy - 07/09/17 1324    Subjective Pt states legs feel better since we have used less compression.  Pt has an appointment to get her ABI checked.    Patient and Family Stated Goals wound to heal    Prior Treatments self care, antibiotics    Pain Assessment No/denies pain   Evaluation and Treatment Procedures Explained to Patient/Family Yes   Evaluation and Treatment Procedures agreed to   Wound Properties Date First Assessed: 06/25/17 Time First Assessed: 1100 Wound Type: Non-pressure wound Location: Leg Location Orientation: Posterior;Medial;Right Wound Description (Comments): blister   Dressing Type  Gauze (Comment);Compression wrap  honey colloid, 2x2, profore lite, netting   Dressing Changed Changed   Dressing Status Dry   Dressing Change Frequency PRN   Site / Wound Assessment Red   % Wound base Red or Granulating 15%   % Wound base Yellow/Fibrinous Exudate 85%   Peri-wound Assessment Intact;Erythema (blanchable)   Wound Length (cm) 1.1 cm  was 1.0   Wound Width (cm) 1.2 cm  was .6   Wound Depth (cm) 0.1 cm   Wound Volume (cm^3) 0.13 cm^3   Wound Surface Area (cm^2) 1.32 cm^2   Margins Unattached edges (unapproximated)   Drainage Amount None   Drainage Description --    Treatment Cleansed;Debridement (Selective)  Minimal debridement completed due to pt intolerance.    Wound Properties Date First Assessed: 06/04/17 Time First Assessed: 3354 Wound Type: Non-pressure wound Location: Leg Location Orientation: Left;Lateral;Lower Present on Admission: Yes   Dressing Type Gauze (Comment);Compression wrap  silverhydrofiber, 4x4, profore lite with netting   Dressing Changed Changed   Dressing Status Old drainage   Dressing Change Frequency PRN   Site / Wound Assessment Yellow;Red;Pink   % Wound base Red or Granulating 60%  was 30   % Wound base Yellow/Fibrinous Exudate 40%  was 70   % Wound base Black/Eschar 0%   Peri-wound Assessment Pink   Wound Length (cm) 3.9 cm  was 4.0   Wound Width (cm) 3.8 cm  was 3.6   Wound Depth (cm) 0.15 cm  was .2    Wound Volume (cm^3) 2.22 cm^3   Wound Surface Area (cm^2) 14.82 cm^2   Margins Unattached edges (unapproximated)   Drainage Amount Minimal   Drainage Description Serous   Treatment Debridement (Selective);Cleansed   Selective Debridement - Location Both wounds   Selective Debridement - Tools Used Forceps;Scalpel   Selective Debridement - Tissue Removed slough and biofilm    Wound Therapy - Clinical Statement Pt wounds remeasured with no appreciable change however wound on Lt LE has significant increased granulation.  Trial of silverhydrofiber to Left; hydrogel to Rt wound followed by 2x2, kerlix and netting no compression do to concerns that wounds could be arterial in nature.     Wound Therapy - Functional Problem List difficulty in walking    Factors Delaying/Impairing Wound Healing Altered sensation;Diabetes Mellitus;Infection - systemic/local;Multiple medical problems;Polypharmacy;Vascular compromise   Hydrotherapy Plan Debridement;Dressing change;Patient/family education   Wound Therapy - Frequency --  2x/week for 6 weeks   Wound Therapy - Current Recommendations PT   Wound Plan Continue appropraite wound  care.  F/U with ABI.     Dressing  Lt lateral wound: silverhydrofiber, 2x2, kerlix and netting   Dressing Rt posterior calf;,hydrogel, 2x2kerlix and netting                   PT Short Term Goals - 07/09/17 1337      PT SHORT TERM GOAL #1   Title Pt to understand signs and symptoms of cellulits and the importance of contacting MD if this occurs    Time 1   Period Weeks   Status Achieved     PT SHORT TERM GOAL #2   Title PT lateral wound to be 75% granulated and to have no black eschar to reduce the risk of infection.   Baseline --  currently no black eschar; 60% granulated    Time 2   Period Weeks   Status On-going     PT SHORT TERM GOAL #3   Title Pt pain  to be decreased to no greater than a 4/10 to allow pt to rest better   Baseline --  07/09/2017:  pain is no greater than a 4/10 except when debriding.  Pt unable to walk 10 minutes due to CHF revised goal to be so that pt can rest better.    Time 3   Period Weeks   Status Revised           PT Long Term Goals - 07/09/17 1340      PT LONG TERM GOAL #1   Title Pt wound to be 100% granulated to prevent infection    Time 4   Period Weeks   Status On-going     PT LONG TERM GOAL #2   Title PT lateral  wound size to be no greater than 2x2 to allow pt to feel comfortable in self care of wound ; anterior wound to be healed    Time 6   Period Weeks   Status On-going     PT LONG TERM GOAL #3   Title If profore felt comfortable to pt pt to be educated in compression garments and why they may be benefical for pt.  Pt has had wounds come up on her Rt leg in the past as well    Baseline --  07/09/2017:  Await result of ABI   Time 6   Period Weeks   Status Deferred               Plan - 07/09/17 1336    Clinical Impression Statement see above   Rehab Potential Good   PT Frequency 2x / week   PT Duration 12 weeks   PT Treatment/Interventions Other (comment)  debridement and dressing change    PT Next  Visit Plan remeasure wounds       Patient will benefit from skilled therapeutic intervention in order to improve the following deficits and impairments:  Decreased activity tolerance, Increased edema, Difficulty walking, Pain  Visit Diagnosis: Pain in left leg  Difficulty in walking, not elsewhere classified  Leg wound, left, subsequent encounter  Leg wound, right, subsequent encounter     Problem List Patient Active Problem List   Diagnosis Date Noted  . Heme positive stool 10/04/2015  . Hyperlipidemia 06/29/2015  . Type 2 diabetes mellitus with stage 4 chronic kidney disease ([place]) 06/29/2015  . Encounter for adequacy testing for hemodialysis ([place]) 04/12/2014  . Chronic kidney disease, stage V ([place]) 04/12/2014  . 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 Encounter for therapeutic drug monitoring 03/30/2014  . Chronic diastolic heart failure ([place]) 03/15/2014  . Renal failure (ARF), acute on chronic (HCC) 03/13/2014  . Anemia of chronic renal failure, stage 4 (severe) ([place]) 02/23/2014  . Folate deficiency 02/17/2014  . Aortic regurgitation 09/19/2012  . Atrial flutter ([place]) 09/14/2012  . Morbid obesity ([place]) 09/14/2012  . Type 2 diabetes mellitus with diabetic neuropathy ([place]) 09/14/2012  . SLE (systemic lupus erythematosus) ([place]) 09/14/2012  . Essential hypertension, benign 09/14/2012  . COPD (chronic obstructive pulmonary disease) ([place]) 09/14/2012    Rayetta Humphrey, PT CLT [phone] 07/09/2017, 1:42 PM  [facility] [address] [place], [place], [address] Phone: [phone]   Fax:  [phone]  Name: [name] MRN: [id number] Date of Birth: 04-Apr-1946

## 2017-07-14 ENCOUNTER — Ambulatory Visit (HOSPITAL_COMMUNITY): Payer: Medicare Other | Admitting: Physical Therapy

## 2017-07-14 DIAGNOSIS — M79605 Pain in left leg: Secondary | ICD-10-CM

## 2017-07-14 DIAGNOSIS — R262 Difficulty in walking, not elsewhere classified: Secondary | ICD-10-CM | POA: Diagnosis not present

## 2017-07-14 DIAGNOSIS — S81802D Unspecified open wound, left lower leg, subsequent encounter: Secondary | ICD-10-CM

## 2017-07-14 DIAGNOSIS — S81801D Unspecified open wound, right lower leg, subsequent encounter: Secondary | ICD-10-CM

## 2017-07-14 NOTE — Therapy (Signed)
[place], [place], [address] Phone: [phone]   Fax:  [phone]  Wound Care Therapy  Patient Details  Name: [name] MRN: [id number] Date of Birth: September 12, 1946 Referring Provider: Sharilyn Sites  Encounter Date: 07/14/2017      PT End of Session - 07/14/17 1417    Visit Number 11   Number of Visits 24   Date for PT Re-Evaluation 07/04/17   Authorization Type UHC medicare   Authorization - Visit Number 11   Authorization - Number of Visits 20   PT Start Time 1300   PT Stop Time 1335   PT Time Calculation (min) 35 min   Activity Tolerance Patient tolerated treatment well   Behavior During Therapy [facility] for tasks assessed/performed      Past Medical History:  Diagnosis Date  . Anemia   . Asthma   . Atrial flutter [facility])    TEE/DCCV December 2013 - on Xarelto [facility])  . Bone spur of toe    Right 5th toe  . CKD (chronic kidney disease) stage 3, GFR 30-59 ml/min (HCC)   . DVT of upper extremity (deep vein thrombosis) ([place])    Right basilic vein, June 6967  . Essential hypertension, benign   . SLE (systemic lupus erythematosus) ([place])   . Type 2 diabetes mellitus ([place])   . UTI (lower urinary tract infection)     Past Surgical History:  Procedure Laterality Date  . ABDOMINAL HYSTERECTOMY  1983  . ANKLE SURGERY  1993  . AV FISTULA PLACEMENT Left 03/27/2014   Procedure: ARTERIOVENOUS (AV) FISTULA CREATION;  Surgeon: Rosetta Posner, MD;  Location: [place];  Service: Vascular;  Laterality: Left;  . BACK SURGERY  1980  . CARDIOVASCULAR STRESS TEST  12/19/2009   no stress induced rhythm abnormalities, ekg negative for ischemia  . CARDIOVERSION  09/16/2012   Procedure: CARDIOVERSION;  Surgeon: Sanda Klein, MD;  Location: [facility] ENDOSCOPY;  Service: Cardiovascular;  Laterality: N/A;  . COLONOSCOPY  09/20/2012   Dr. [name]: multiple tubular adenomas   . COLONOSCOPY  2005   Dr. [name]. Polyps, path unknown    . COLONOSCOPY N/A 07/24/2016   Procedure: COLONOSCOPY;  Surgeon: [name], MD;  [facility];  Service: Endoscopy;  Laterality: N/A;  9:00 am  . ESOPHAGOGASTRODUODENOSCOPY  09/20/2012   Dr. [name]: duodenal erosion and possible resolving ulcer at the angularis   . TEE WITHOUT CARDIOVERSION  09/16/2012   Procedure: TRANSESOPHAGEAL ECHOCARDIOGRAM (TEE);  Surgeon: Sanda Klein, MD;  Location: [facility] ENDOSCOPY;  Service: Cardiovascular;  Laterality: N/A;  . TRANSESOPHAGEAL ECHOCARDIOGRAM WITH CARDIOVERSION  09/16/2012   EF 60-65%, moderate LVH, moderate regurg of the aortic valve, LA moderately dilated    There were no vitals filed for this visit.                  Wound Therapy - 07/14/17 1405    Subjective Pt arrived with dressings slid down and very tight around ankles.  States she is getting her ABI's done tomorrow.   Pain Assessment No/denies pain  has pain with debridement only, none at rest   Evaluation and Treatment Procedures Explained to Patient/Family Yes   Evaluation and Treatment Procedures agreed to   Wound Properties Date First Assessed: 06/25/17 Time First Assessed: 1100 Wound Type: Non-pressure wound Location: Leg Location Orientation: Posterior;Medial;Right Wound Description (Comments): blister   Dressing Type Gauze (Comment);Compression wrap   Dressing Changed Changed   Dressing Status  Dry   Dressing Change Frequency PRN   Site / Wound Assessment Red   % Wound base Red or Granulating 15%   % Wound base Yellow/Fibrinous Exudate 85%   Peri-wound Assessment Intact;Erythema (blanchable)   Margins Unattached edges (unapproximated)   Drainage Amount Scant   Drainage Description Serous   Treatment Cleansed;Debridement (Selective)   Wound Properties Date First Assessed: 06/04/17 Time First Assessed: 6384 Wound Type: Non-pressure wound Location: Leg Location Orientation: Left;Lateral;Lower Present on Admission: Yes   Dressing Type Gauze  (Comment);Compression wrap   Dressing Changed Changed   Dressing Status Old drainage   Dressing Change Frequency PRN   Site / Wound Assessment Yellow;Red;Pink   % Wound base Red or Granulating 60%   % Wound base Yellow/Fibrinous Exudate 40%   % Wound base Black/Eschar 0%   Peri-wound Assessment Pink   Margins Unattached edges (unapproximated)   Drainage Amount Scant   Drainage Description Serous   Treatment Cleansed;Debridement (Selective)   Selective Debridement - Location Both wounds   Selective Debridement - Tools Used Forceps;Scalpel   Selective Debridement - Tissue Removed slough and biofilm    Wound Therapy - Clinical Statement dressings had shifted and gauze was stuck in wound bed of Lt LE wound and painful.  Cleansed both wounds well.  No significant change noted.  Both were rather dry.  changed dressing to xeroform on Lt LE and covered with 2X2 and medipore tape.  Moisturized LE's well prior to securing with #6 netting.  No kling was used this session to prevent ringing around the LE.     Wound Therapy - Functional Problem List difficulty in walking    Factors Delaying/Impairing Wound Healing Altered sensation;Diabetes Mellitus;Infection - systemic/local;Multiple medical problems;Polypharmacy;Vascular compromise   Hydrotherapy Plan Debridement;Dressing change;Patient/family education   Wound Plan Continue appropraite wound care.  F/U with ABI.     Dressing  B wounds xeroform, 2X2, medipore #6 netting                   PT Short Term Goals - 07/09/17 1337      PT SHORT TERM GOAL #1   Title Pt to understand signs and symptoms of cellulits and the importance of contacting MD if this occurs    Time 1   Period Weeks   Status Achieved     PT SHORT TERM GOAL #2   Title PT lateral wound to be 75% granulated and to have no black eschar to reduce the risk of infection.   Baseline --  currently no black eschar; 60% granulated    Time 2   Period Weeks   Status On-going      PT SHORT TERM GOAL #3   Title Pt pain to be decreased to no greater than a 4/10 to allow pt to rest better   Baseline --  07/09/2017:  pain is no greater than a 4/10 except when debriding.  Pt unable to walk 10 minutes due to CHF revised goal to be so that pt can rest better.    Time 3   Period Weeks   Status Revised           PT Long Term Goals - 07/09/17 1340      PT LONG TERM GOAL #1   Title Pt wound to be 100% granulated to prevent infection    Time 4   Period Weeks   Status On-going     PT LONG TERM GOAL #2   Title PT lateral  wound size  to be no greater than 2x2 to allow pt to feel comfortable in self care of wound ; anterior wound to be healed    Time 6   Period Weeks   Status On-going     PT LONG TERM GOAL #3   Title If profore felt comfortable to pt pt to be educated in compression garments and why they may be benefical for pt.  Pt has had wounds come up on her Rt leg in the past as well    Baseline --  07/09/2017:  Await result of ABI   Time 6   Period Weeks   Status Deferred             Patient will benefit from skilled therapeutic intervention in order to improve the following deficits and impairments:     Visit Diagnosis: Pain in left leg  Difficulty in walking, not elsewhere classified  Leg wound, left, subsequent encounter  Leg wound, right, subsequent encounter     Problem List Patient Active Problem List   Diagnosis Date Noted  . Heme positive stool 10/04/2015  . Hyperlipidemia 06/29/2015  . Type 2 diabetes mellitus with stage 4 chronic kidney disease ([place]) 06/29/2015  . Encounter for adequacy testing for hemodialysis ([place]) 04/12/2014  . Chronic kidney disease, stage V ([place]) 04/12/2014  . 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 Encounter for therapeutic drug monitoring 03/30/2014  . Chronic diastolic heart failure ([place]) 03/15/2014  . Renal failure (ARF), acute on  chronic (HCC) 03/13/2014  . Anemia of chronic renal failure, stage 4 (severe) ([place]) 02/23/2014  . Folate deficiency 02/17/2014  . Aortic regurgitation 09/19/2012  . Atrial flutter ([place]) 09/14/2012  . Morbid obesity ([place]) 09/14/2012  . Type 2 diabetes mellitus with diabetic neuropathy ([place]) 09/14/2012  . SLE (systemic lupus erythematosus) ([place]) 09/14/2012  . Essential hypertension, benign 09/14/2012  . COPD (chronic obstructive pulmonary disease) ([place]) 09/14/2012   Teena Irani, PTA/CLT [phone]  Teena Irani 07/14/2017, 2:18 PM  [facility] [address] [place], [place], [address] Phone: [phone]   Fax:  [phone]  Name: SERITA DEGROOTE MRN: [id number] Date of Birth: 09/13/1946

## 2017-07-15 ENCOUNTER — Ambulatory Visit (HOSPITAL_COMMUNITY)
Admission: RE | Admit: 2017-07-15 | Discharge: 2017-07-15 | Disposition: A | Discharge status: Home / Self Care | Payer: Medicare Other | Source: Ambulatory Visit | Attending: Family Medicine | Admitting: Family Medicine

## 2017-07-15 ENCOUNTER — Encounter (HOSPITAL_BASED_OUTPATIENT_CLINIC_OR_DEPARTMENT_OTHER): Payer: Medicare Other

## 2017-07-15 ENCOUNTER — Encounter (HOSPITAL_COMMUNITY): Payer: Medicare Other

## 2017-07-15 ENCOUNTER — Encounter (HOSPITAL_COMMUNITY): Payer: Self-pay

## 2017-07-15 VITALS — BP 154/58 | HR 72 | Temp 97.8°F | Resp 20

## 2017-07-15 DIAGNOSIS — D631 Anemia in chronic kidney disease: Secondary | ICD-10-CM

## 2017-07-15 DIAGNOSIS — L97919 Non-pressure chronic ulcer of unspecified part of right lower leg with unspecified severity: Secondary | ICD-10-CM | POA: Diagnosis not present

## 2017-07-15 DIAGNOSIS — N184 Chronic kidney disease, stage 4 (severe): Secondary | ICD-10-CM | POA: Diagnosis not present

## 2017-07-15 DIAGNOSIS — E118 Type 2 diabetes mellitus with unspecified complications: Secondary | ICD-10-CM | POA: Diagnosis not present

## 2017-07-15 DIAGNOSIS — I771 Stricture of artery: Secondary | ICD-10-CM | POA: Insufficient documentation

## 2017-07-15 DIAGNOSIS — N058 Unspecified nephritic syndrome with other morphologic changes: Secondary | ICD-10-CM

## 2017-07-15 LAB — CBC WITH DIFFERENTIAL/PLATELET
BASOS ABS: 0 10*3/uL (ref 0.0–0.1)
BASOS PCT: 0 %
EOS PCT: 2 %
Eosinophils Absolute: 0.2 10*3/uL (ref 0.0–0.7)
HCT: 28.9 % — ABNORMAL LOW (ref 36.0–46.0)
Hemoglobin: 8.4 g/dL — ABNORMAL LOW (ref 12.0–15.0)
Lymphocytes Relative: 11 %
Lymphs Abs: 0.8 10*3/uL (ref 0.7–4.0)
MCH: 25.5 pg — ABNORMAL LOW (ref 26.0–34.0)
MCHC: 29.1 g/dL — ABNORMAL LOW (ref 30.0–36.0)
MCV: 87.6 fL (ref 78.0–100.0)
MONO ABS: 0.6 10*3/uL (ref 0.1–1.0)
Monocytes Relative: 8 %
Neutro Abs: 5.8 10*3/uL (ref 1.7–7.7)
Neutrophils Relative %: 79 %
PLATELETS: 277 10*3/uL (ref 150–400)
RBC: 3.3 MIL/uL — AB (ref 3.87–5.11)
RDW: 18 % — AB (ref 11.5–15.5)
WBC: 7.3 10*3/uL (ref 4.0–10.5)

## 2017-07-15 MED ORDER — DARBEPOETIN ALFA 300 MCG/0.6ML IJ SOSY
PREFILLED_SYRINGE | INTRAMUSCULAR | Status: AC
  Filled 2017-07-15: qty 0.6

## 2017-07-15 MED ORDER — DARBEPOETIN ALFA 300 MCG/0.6ML IJ SOSY
300.0000 ug | PREFILLED_SYRINGE | Freq: Once | INTRAMUSCULAR | Status: AC
  Administered 2017-07-15: 300 ug via SUBCUTANEOUS

## 2017-07-15 NOTE — Patient Instructions (Signed)
[facility] Discharge Instructions  RECOMMENDATIONS MADE BY THE CONSULTANT AND ANY TEST RESULTS WILL BE SENT TO YOUR REFERRING PHYSICIAN.  Received Aranesp injection today. Follow-up as scheduled. Call clinic for any questions or concerns  Thank you for choosing [facility] at [facility] to provide your oncology and hematology care.  To afford each patient quality time with our provider, please arrive at least 15 minutes before your scheduled appointment time.    If you have a lab appointment with the Cancer Center please come in thru the  Main Entrance and check in at the main information desk  You need to re-schedule your appointment should you arrive 10 or more minutes late.  We strive to give you quality time with our providers, and arriving late affects you and other patients whose appointments are after yours.  Also, if you no show three or more times for appointments you may be dismissed from the clinic at the providers discretion.     Again, thank you for choosing [facility].  Our hope is that these requests will decrease the amount of time that you wait before being seen by our physicians.       _____________________________________________________________  Should you have questions after your visit to [facility], please contact our office at [phone] between the hours of 8:30 a.m. and 4:30 p.m.  Voicemails left after 4:30 p.m. will not be returned until the following business day.  For prescription refill requests, have your pharmacy contact our office.       Resources For Cancer Patients and their Caregivers ? American Cancer Society: Can assist with transportation, wigs, general needs, runs Look Good Feel Better.        1-888-227-6333 ? Cancer Care: Provides financial assistance, online support groups, medication/co-pay assistance.  1-800-813-HOPE (4673) ? [facility] Assists [place] cancer patients and their families through emotional , educational and financial support.  336-427-4357 ? [place] DSS Where to apply for food stamps, Medicaid and utility assistance. 336-342-1394 ? [facility]: Transportation to medical appointments. 336-347-2287 ? Social Security Administration: May apply for disability if have a Stage IV cancer. 336-342-7796 1-800-772-1213 ? [place] Aging, Disability and Transit Services: Assists with nutrition, care and transit needs. 336-349-2343  Cancer Center Support Programs: @10RELATIVEDAYS@ > Cancer Support Group  2nd Tuesday of the month 1pm-2pm, Journey Room  > Creative Journey  3rd Tuesday of the month 1130am-1pm, Journey Room  > Look Good Feel Better  1st Wednesday of the month 10am-12 noon, Journey Room (Call American Cancer Society to register 1-800-395-5775)   

## 2017-07-15 NOTE — Progress Notes (Signed)
[name] tolerated Aranesp injection well without complaints or incident. Hgb 8.4 today. VSS Pt discharged via wheelchair in satisfactory condition accompanied by her husband

## 2017-07-16 ENCOUNTER — Ambulatory Visit (HOSPITAL_COMMUNITY): Payer: Medicare Other | Admitting: Physical Therapy

## 2017-07-16 DIAGNOSIS — S81801D Unspecified open wound, right lower leg, subsequent encounter: Secondary | ICD-10-CM | POA: Diagnosis not present

## 2017-07-16 DIAGNOSIS — S81802D Unspecified open wound, left lower leg, subsequent encounter: Secondary | ICD-10-CM

## 2017-07-16 DIAGNOSIS — M79605 Pain in left leg: Secondary | ICD-10-CM | POA: Diagnosis not present

## 2017-07-16 DIAGNOSIS — E113213 Type 2 diabetes mellitus with mild nonproliferative diabetic retinopathy with macular edema, bilateral: Secondary | ICD-10-CM | POA: Diagnosis not present

## 2017-07-16 DIAGNOSIS — Z961 Presence of intraocular lens: Secondary | ICD-10-CM | POA: Diagnosis not present

## 2017-07-16 DIAGNOSIS — H43821 Vitreomacular adhesion, right eye: Secondary | ICD-10-CM | POA: Diagnosis not present

## 2017-07-16 DIAGNOSIS — H35342 Macular cyst, hole, or pseudohole, left eye: Secondary | ICD-10-CM | POA: Diagnosis not present

## 2017-07-16 DIAGNOSIS — R262 Difficulty in walking, not elsewhere classified: Secondary | ICD-10-CM

## 2017-07-16 DIAGNOSIS — H43393 Other vitreous opacities, bilateral: Secondary | ICD-10-CM | POA: Diagnosis not present

## 2017-07-16 NOTE — Therapy (Signed)
[place], [place], [address] Phone: [phone]   Fax:  [phone]  Wound Care Therapy  Patient Details  Name: [name] MRN: [id number] Date of Birth: [birth year] Referring Provider: Sharilyn Sites  Encounter Date: 07/16/2017      PT End of Session - 07/16/17 1522    Visit Number 12   Number of Visits 24   Date for PT Re-Evaluation 07/04/17   Authorization Type UHC medicare   Authorization - Visit Number 12   Authorization - Number of Visits 20   PT Start Time 6720   PT Stop Time 1505   PT Time Calculation (min) 30 min   Activity Tolerance Patient tolerated treatment well   Behavior During Therapy [facility] for tasks assessed/performed      Past Medical History:  Diagnosis Date  . Anemia   . Asthma   . Atrial flutter [facility])    TEE/DCCV December 2013 - on Xarelto [facility])  . Bone spur of toe    Right 5th toe  . CKD (chronic kidney disease) stage 3, GFR 30-59 ml/min (HCC)   . DVT of upper extremity (deep vein thrombosis) ([place])    Right basilic vein, June 9470  . Essential hypertension, benign   . SLE (systemic lupus erythematosus) ([place])   . Type 2 diabetes mellitus ([place])   . UTI (lower urinary tract infection)     Past Surgical History:  Procedure Laterality Date  . ABDOMINAL HYSTERECTOMY  1983  . ANKLE SURGERY  1993  . AV FISTULA PLACEMENT Left 03/27/2014   Procedure: ARTERIOVENOUS (AV) FISTULA CREATION;  Surgeon: Rosetta Posner, MD;  Location: [place];  Service: Vascular;  Laterality: Left;  . BACK SURGERY  1980  . CARDIOVASCULAR STRESS TEST  12/19/2009   no stress induced rhythm abnormalities, ekg negative for ischemia  . CARDIOVERSION  09/16/2012   Procedure: CARDIOVERSION;  Surgeon: Sanda Klein, MD;  Location: [facility] ENDOSCOPY;  Service: Cardiovascular;  Laterality: N/A;  . COLONOSCOPY  09/20/2012   Dr. [name]: multiple tubular adenomas   . COLONOSCOPY  2005   Dr. [name]. Polyps, path unknown    . COLONOSCOPY N/A 07/24/2016   Procedure: COLONOSCOPY;  Surgeon: [name], MD;  [facility];  Service: Endoscopy;  Laterality: N/A;  9:00 am  . ESOPHAGOGASTRODUODENOSCOPY  09/20/2012   Dr. [name]: duodenal erosion and possible resolving ulcer at the angularis   . TEE WITHOUT CARDIOVERSION  09/16/2012   Procedure: TRANSESOPHAGEAL ECHOCARDIOGRAM (TEE);  Surgeon: Sanda Klein, MD;  Location: [facility] ENDOSCOPY;  Service: Cardiovascular;  Laterality: N/A;  . TRANSESOPHAGEAL ECHOCARDIOGRAM WITH CARDIOVERSION  09/16/2012   EF 60-65%, moderate LVH, moderate regurg of the aortic valve, LA moderately dilated    There were no vitals filed for this visit.                  Wound Therapy - 07/16/17 1512    Subjective Pt reports much improvement with new dressings using tape and netting only.  STates she got her ABI's completed yesterday but the MD has not contacted her with the results yet.    Evaluation and Treatment Procedures Explained to Patient/Family Yes   Evaluation and Treatment Procedures agreed to   Wound Properties Date First Assessed: 06/25/17 Time First Assessed: 1100 Wound Type: Non-pressure wound Location: Leg Location Orientation: Posterior;Medial;Right Wound Description (Comments): blister   Dressing Type Gauze (Comment);Compression wrap   Dressing Status Dry   Dressing Change Frequency PRN  Site / Wound Assessment Red   % Wound base Red or Granulating 20%   % Wound base Yellow/Fibrinous Exudate 80%   Peri-wound Assessment Intact;Erythema (blanchable)   Wound Length (cm) 0.9 cm  was 1.1 cm   Wound Width (cm) 1.1 cm  was 1.2 cm   Wound Depth (cm) 0.1 cm  was 0.1   Wound Volume (cm^3) 0.1 cm^3   Wound Surface Area (cm^2) 0.99 cm^2   Margins Unattached edges (unapproximated)   Drainage Amount Scant   Drainage Description Serous   Treatment Cleansed;Debridement (Selective)   Wound Properties Date First Assessed: 06/04/17 Time First Assessed: 4782  Wound Type: Non-pressure wound Location: Leg Location Orientation: Left;Lateral;Lower Present on Admission: Yes   Dressing Type Gauze (Comment);Compression wrap   Dressing Status Old drainage   Dressing Change Frequency PRN   Site / Wound Assessment Yellow;Red;Pink   % Wound base Red or Granulating 60%   % Wound base Yellow/Fibrinous Exudate 40%   % Wound base Black/Eschar 0%   Peri-wound Assessment Pink   Wound Length (cm) 3 cm  was 3.9   Wound Width (cm) 3.5 cm  was 3.8   Wound Depth (cm) 0.1 cm  was 0.1   Wound Volume (cm^3) 1.05 cm^3   Wound Surface Area (cm^2) 10.5 cm^2   Margins Unattached edges (unapproximated)   Drainage Amount Scant   Drainage Description Serous   Treatment Cleansed;Debridement (Selective)   Selective Debridement - Location Both wounds   Selective Debridement - Tools Used Forceps;Scalpel   Selective Debridement - Tissue Removed slough and biofilm    Wound Therapy - Clinical Statement Wounds remeasured with vast improvement, approximation in both wounds.  continued with xeroform and 2x2/medipore dressings.  Secured with #5 netting.   Wound Therapy - Functional Problem List difficulty in walking    Factors Delaying/Impairing Wound Healing Altered sensation;Diabetes Mellitus;Infection - systemic/local;Multiple medical problems;Polypharmacy;Vascular compromise   Hydrotherapy Plan Debridement;Dressing change;Patient/family education   Wound Plan Continue appropriate wound care.  F/U with ABI.     Dressing  B wounds xeroform, 2X2, medipore #6 netting                   PT Short Term Goals - 07/09/17 1337      PT SHORT TERM GOAL #1   Title Pt to understand signs and symptoms of cellulits and the importance of contacting MD if this occurs    Time 1   Period Weeks   Status Achieved     PT SHORT TERM GOAL #2   Title PT lateral wound to be 75% granulated and to have no black eschar to reduce the risk of infection.   Baseline --  currently no black  eschar; 60% granulated    Time 2   Period Weeks   Status On-going     PT SHORT TERM GOAL #3   Title Pt pain to be decreased to no greater than a 4/10 to allow pt to rest better   Baseline --  07/09/2017:  pain is no greater than a 4/10 except when debriding.  Pt unable to walk 10 minutes due to CHF revised goal to be so that pt can rest better.    Time 3   Period Weeks   Status Revised           PT Long Term Goals - 07/09/17 1340      PT LONG TERM GOAL #1   Title Pt wound to be 100% granulated to prevent infection  Time 4   Period Weeks   Status On-going     PT LONG TERM GOAL #2   Title PT lateral  wound size to be no greater than 2x2 to allow pt to feel comfortable in self care of wound ; anterior wound to be healed    Time 6   Period Weeks   Status On-going     PT LONG TERM GOAL #3   Title If profore felt comfortable to pt pt to be educated in compression garments and why they may be benefical for pt.  Pt has had wounds come up on her Rt leg in the past as well    Baseline --  07/09/2017:  Await result of ABI   Time 6   Period Weeks   Status Deferred             Patient will benefit from skilled therapeutic intervention in order to improve the following deficits and impairments:     Visit Diagnosis: Pain in left leg  Difficulty in walking, not elsewhere classified  Leg wound, left, subsequent encounter  Leg wound, right, subsequent encounter     Problem List Patient Active Problem List   Diagnosis Date Noted  . Heme positive stool 10/04/2015  . Hyperlipidemia 06/29/2015  . Type 2 diabetes mellitus with stage 4 chronic kidney disease ([place]) 06/29/2015  . Encounter for adequacy testing for hemodialysis ([place]) 04/12/2014  . Chronic kidney disease, stage V ([place]) 04/12/2014  . 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 Encounter for therapeutic drug monitoring 03/30/2014  . Chronic diastolic  heart failure ([place]) 03/15/2014  . Renal failure (ARF), acute on chronic (HCC) 03/13/2014  . Anemia of chronic renal failure, stage 4 (severe) ([place]) 02/23/2014  . Folate deficiency 02/17/2014  . Aortic regurgitation 09/19/2012  . Atrial flutter ([place]) 09/14/2012  . Morbid obesity ([place]) 09/14/2012  . Type 2 diabetes mellitus with diabetic neuropathy ([place]) 09/14/2012  . SLE (systemic lupus erythematosus) ([place]) 09/14/2012  . Essential hypertension, benign 09/14/2012  . COPD (chronic obstructive pulmonary disease) ([place]) 09/14/2012   Teena Irani, PTA/CLT [phone]  Teena Irani 07/16/2017, 3:23 PM  [place] [address] [place], [place], [address] Phone: [phone]   Fax:  [phone]  Name: [name] MRN: [id number] Date of Birth: 02/05/1946

## 2017-07-17 ENCOUNTER — Other Ambulatory Visit: Payer: Self-pay | Admitting: "Endocrinology

## 2017-07-17 DIAGNOSIS — E1122 Type 2 diabetes mellitus with diabetic chronic kidney disease: Secondary | ICD-10-CM | POA: Diagnosis not present

## 2017-07-17 DIAGNOSIS — Z794 Long term (current) use of insulin: Secondary | ICD-10-CM | POA: Diagnosis not present

## 2017-07-17 DIAGNOSIS — N184 Chronic kidney disease, stage 4 (severe): Secondary | ICD-10-CM | POA: Diagnosis not present

## 2017-07-18 LAB — RENAL FUNCTION PANEL
Albumin: 3.3 g/dL — ABNORMAL LOW (ref 3.5–4.8)
BUN / CREAT RATIO: 28 (ref 12–28)
BUN: 66 mg/dL — ABNORMAL HIGH (ref 8–27)
CALCIUM: 8.5 mg/dL — AB (ref 8.7–10.3)
CHLORIDE: 112 mmol/L — AB (ref 96–106)
CO2: 19 mmol/L — AB (ref 20–29)
CREATININE: 2.34 mg/dL — AB (ref 0.57–1.00)
GFR calc Af Amer: 23 mL/min/{1.73_m2} — ABNORMAL LOW (ref >59)
GFR calc non Af Amer: 20 mL/min/{1.73_m2} — ABNORMAL LOW (ref >59)
Glucose: 46 mg/dL — ABNORMAL LOW (ref 65–99)
Phosphorus: 4.8 mg/dL — ABNORMAL HIGH (ref 2.5–4.5)
Potassium: 3.6 mmol/L (ref 3.5–5.2)
SODIUM: 145 mmol/L — AB (ref 134–144)

## 2017-07-18 LAB — HEMOGLOBIN: Hemoglobin: 8.4 g/dL — ABNORMAL LOW (ref 11.1–15.9)

## 2017-07-20 ENCOUNTER — Other Ambulatory Visit (HOSPITAL_COMMUNITY): Payer: Self-pay | Admitting: *Deleted

## 2017-07-20 DIAGNOSIS — N184 Chronic kidney disease, stage 4 (severe): Principal | ICD-10-CM

## 2017-07-20 DIAGNOSIS — D631 Anemia in chronic kidney disease: Secondary | ICD-10-CM

## 2017-07-21 ENCOUNTER — Ambulatory Visit (HOSPITAL_COMMUNITY): Payer: Medicare Other | Admitting: Physical Therapy

## 2017-07-21 DIAGNOSIS — S81801D Unspecified open wound, right lower leg, subsequent encounter: Secondary | ICD-10-CM

## 2017-07-21 DIAGNOSIS — M79605 Pain in left leg: Secondary | ICD-10-CM | POA: Diagnosis not present

## 2017-07-21 DIAGNOSIS — S81802D Unspecified open wound, left lower leg, subsequent encounter: Secondary | ICD-10-CM | POA: Diagnosis not present

## 2017-07-21 DIAGNOSIS — R262 Difficulty in walking, not elsewhere classified: Secondary | ICD-10-CM | POA: Diagnosis not present

## 2017-07-21 NOTE — Therapy (Signed)
[place], [place], [address] Phone: [phone]   Fax:  [phone]  Wound Care Therapy  Patient Details  Name: [name] MRN: [id number] Date of Birth: [birth year] Referring Provider: Sharilyn Sites  Encounter Date: 07/21/2017      PT End of Session - 07/21/17 1339    Visit Number 13   Number of Visits 24   Date for PT Re-Evaluation 07/04/17   Authorization Type UHC medicare   Authorization - Visit Number 13   Authorization - Number of Visits 20   PT Start Time 1300   PT Stop Time 1330   PT Time Calculation (min) 30 min   Activity Tolerance Patient tolerated treatment well   Behavior During Therapy [facility] for tasks assessed/performed      Past Medical History:  Diagnosis Date  . Anemia   . Asthma   . Atrial flutter [facility])    TEE/DCCV December 2013 - on Xarelto [facility])  . Bone spur of toe    Right 5th toe  . CKD (chronic kidney disease) stage 3, GFR 30-59 ml/min (HCC)   . DVT of upper extremity (deep vein thrombosis) ([place])    Right basilic vein, June 0175  . Essential hypertension, benign   . SLE (systemic lupus erythematosus) ([place])   . Type 2 diabetes mellitus ([place])   . UTI (lower urinary tract infection)     Past Surgical History:  Procedure Laterality Date  . ABDOMINAL HYSTERECTOMY  1983  . ANKLE SURGERY  1993  . AV FISTULA PLACEMENT Left 03/27/2014   Procedure: ARTERIOVENOUS (AV) FISTULA CREATION;  Surgeon: Rosetta Posner, MD;  Location: [place];  Service: Vascular;  Laterality: Left;  . BACK SURGERY  1980  . CARDIOVASCULAR STRESS TEST  12/19/2009   no stress induced rhythm abnormalities, ekg negative for ischemia  . CARDIOVERSION  09/16/2012   Procedure: CARDIOVERSION;  Surgeon: Sanda Klein, MD;  Location: [facility] ENDOSCOPY;  Service: Cardiovascular;  Laterality: N/A;  . COLONOSCOPY  09/20/2012   Dr. [name]: multiple tubular adenomas   . COLONOSCOPY  2005   Dr. [name]. Polyps, path unknown    . COLONOSCOPY N/A 07/24/2016   Procedure: COLONOSCOPY;  Surgeon: [name], MD;  [facility];  Service: Endoscopy;  Laterality: N/A;  9:00 am  . ESOPHAGOGASTRODUODENOSCOPY  09/20/2012   Dr. [name]: duodenal erosion and possible resolving ulcer at the angularis   . TEE WITHOUT CARDIOVERSION  09/16/2012   Procedure: TRANSESOPHAGEAL ECHOCARDIOGRAM (TEE);  Surgeon: Sanda Klein, MD;  Location: [facility] ENDOSCOPY;  Service: Cardiovascular;  Laterality: N/A;  . TRANSESOPHAGEAL ECHOCARDIOGRAM WITH CARDIOVERSION  09/16/2012   EF 60-65%, moderate LVH, moderate regurg of the aortic valve, LA moderately dilated    There were no vitals filed for this visit.                  Wound Therapy - 07/21/17 1333    Subjective Pt states that she continues to have significant pain in her legs    Patient and Family Stated Goals wound to heal    Prior Treatments self care, antibiotics    Evaluation and Treatment Procedures Explained to Patient/Family Yes   Evaluation and Treatment Procedures agreed to   Wound Properties Date First Assessed: 06/25/17 Time First Assessed: 1100 Wound Type: Non-pressure wound Location: Leg Location Orientation: Posterior;Medial;Right Wound Description (Comments): blister   Dressing Type Gauze (Comment);Compression wrap   Dressing Status Dry   Dressing Change Frequency PRN  Site / Wound Assessment Red   % Wound base Red or Granulating 5%   % Wound base Yellow/Fibrinous Exudate 95%   Peri-wound Assessment Intact;Erythema (blanchable)   Wound Length (cm) 1 cm   Wound Width (cm) 1.4 cm   Wound Depth (cm) 0.3 cm   Wound Volume (cm^3) 0.42 cm^3   Wound Surface Area (cm^2) 1.4 cm^2   Margins Unattached edges (unapproximated)   Drainage Amount Scant   Drainage Description Serous   Treatment Cleansed;Debridement (Selective)   Wound Properties Date First Assessed: 06/04/17 Time First Assessed: 3536 Wound Type: Non-pressure wound Location: Leg Location  Orientation: Left;Lateral;Lower Present on Admission: Yes   Dressing Type Gauze (Comment);Compression wrap   Dressing Changed Changed   Dressing Status Old drainage   Dressing Change Frequency PRN   Site / Wound Assessment Yellow;Red;Pink   % Wound base Red or Granulating 80%   % Wound base Yellow/Fibrinous Exudate 20%   % Wound base Black/Eschar 0%   Peri-wound Assessment Pink   Wound Length (cm) 3.1 cm   Wound Width (cm) 3.4 cm   Wound Surface Area (cm^2) 10.54 cm^2   Margins Unattached edges (unapproximated)   Drainage Amount Scant   Drainage Description Serous   Selective Debridement - Location Both wounds   Selective Debridement - Tools Used Forceps;Scalpel   Selective Debridement - Tissue Removed slough and biofilm    Wound Therapy - Clinical Statement Improved granulation of Lt LE wound but decreased granulation of Rt LE.  Therapist bladed Rt LE wound to soften slough.  Debridement remains very painful for patient.     Wound Therapy - Functional Problem List difficulty in walking    Factors Delaying/Impairing Wound Healing Altered sensation;Diabetes Mellitus;Infection - systemic/local;Multiple medical problems;Polypharmacy;Vascular compromise   Hydrotherapy Plan Debridement;Dressing change;Patient/family education   Wound Plan Continue appropriate wound care.  F/U with ABI.     Dressing  B wounds xeroform, 2X2, medipore #6 netting                   PT Short Term Goals - 07/09/17 1337      PT SHORT TERM GOAL #1   Title Pt to understand signs and symptoms of cellulits and the importance of contacting MD if this occurs    Time 1   Period Weeks   Status Achieved     PT SHORT TERM GOAL #2   Title PT lateral wound to be 75% granulated and to have no black eschar to reduce the risk of infection.   Baseline --  currently no black eschar; 60% granulated    Time 2   Period Weeks   Status On-going     PT SHORT TERM GOAL #3   Title Pt pain to be decreased to no  greater than a 4/10 to allow pt to rest better   Baseline --  07/09/2017:  pain is no greater than a 4/10 except when debriding.  Pt unable to walk 10 minutes due to CHF revised goal to be so that pt can rest better.    Time 3   Period Weeks   Status Revised           PT Long Term Goals - 07/09/17 1340      PT LONG TERM GOAL #1   Title Pt wound to be 100% granulated to prevent infection    Time 4   Period Weeks   Status On-going     PT LONG TERM GOAL #2   Title PT lateral  wound size to be no greater than 2x2 to allow pt to feel comfortable in self care of wound ; anterior wound to be healed    Time 6   Period Weeks   Status On-going     PT LONG TERM GOAL #3   Title If profore felt comfortable to pt pt to be educated in compression garments and why they may be benefical for pt.  Pt has had wounds come up on her Rt leg in the past as well    Baseline --  07/09/2017:  Await result of ABI   Time 6   Period Weeks   Status Deferred               Plan - 07/21/17 1340    Clinical Impression Statement see above    Rehab Potential Good   PT Frequency 2x / week   PT Duration 12 weeks   PT Treatment/Interventions Other (comment)  debridement and dressing change    PT Next Visit Plan continue to remeasure wounds on Tuesdays.       Patient will benefit from skilled therapeutic intervention in order to improve the following deficits and impairments:  Decreased activity tolerance, Increased edema, Difficulty walking, Pain  Visit Diagnosis: Pain in left leg  Leg wound, left, subsequent encounter  Leg wound, right, subsequent encounter     Problem List Patient Active Problem List   Diagnosis Date Noted  . Heme positive stool 10/04/2015  . Hyperlipidemia 06/29/2015  . Type 2 diabetes mellitus with stage 4 chronic kidney disease ([place]) 06/29/2015  . Encounter for adequacy testing for hemodialysis ([place]) 04/12/2014  . Chronic kidney disease, stage V ([place]) 04/12/2014   . 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 Encounter for therapeutic drug monitoring 03/30/2014  . Chronic diastolic heart failure ([place]) 03/15/2014  . Renal failure (ARF), acute on chronic (HCC) 03/13/2014  . Anemia of chronic renal failure, stage 4 (severe) ([place]) 02/23/2014  . Folate deficiency 02/17/2014  . Aortic regurgitation 09/19/2012  . Atrial flutter ([place]) 09/14/2012  . Morbid obesity ([place]) 09/14/2012  . Type 2 diabetes mellitus with diabetic neuropathy ([place]) 09/14/2012  . SLE (systemic lupus erythematosus) ([place]) 09/14/2012  . Essential hypertension, benign 09/14/2012  . COPD (chronic obstructive pulmonary disease) ([place]) 09/14/2012    Rayetta Humphrey, PT CLT [phone] 07/21/2017, 1:41 PM  [place] [address] [place], [place], [address] Phone: [phone]   Fax:  [phone]  Name: LANNETTE AVELLINO MRN: [id number] Date of Birth: 01-19-1946

## 2017-07-22 ENCOUNTER — Encounter (HOSPITAL_COMMUNITY): Payer: Medicare Other

## 2017-07-22 ENCOUNTER — Encounter (HOSPITAL_BASED_OUTPATIENT_CLINIC_OR_DEPARTMENT_OTHER): Payer: Medicare Other

## 2017-07-22 ENCOUNTER — Ambulatory Visit (INDEPENDENT_AMBULATORY_CARE_PROVIDER_SITE_OTHER): Payer: Medicare Other | Admitting: *Deleted

## 2017-07-22 ENCOUNTER — Encounter (HOSPITAL_COMMUNITY): Payer: Self-pay

## 2017-07-22 VITALS — BP 162/79 | HR 71 | Temp 98.5°F | Resp 20

## 2017-07-22 DIAGNOSIS — Z5181 Encounter for therapeutic drug level monitoring: Secondary | ICD-10-CM | POA: Diagnosis not present

## 2017-07-22 DIAGNOSIS — D631 Anemia in chronic kidney disease: Secondary | ICD-10-CM

## 2017-07-22 DIAGNOSIS — N184 Chronic kidney disease, stage 4 (severe): Principal | ICD-10-CM

## 2017-07-22 DIAGNOSIS — I4892 Unspecified atrial flutter: Secondary | ICD-10-CM

## 2017-07-22 DIAGNOSIS — N058 Unspecified nephritic syndrome with other morphologic changes: Secondary | ICD-10-CM

## 2017-07-22 LAB — CBC WITH DIFFERENTIAL/PLATELET
BASOS ABS: 0 10*3/uL (ref 0.0–0.1)
BASOS PCT: 0 %
EOS ABS: 0.1 10*3/uL (ref 0.0–0.7)
EOS PCT: 2 %
HCT: 29.4 % — ABNORMAL LOW (ref 36.0–46.0)
Hemoglobin: 8.6 g/dL — ABNORMAL LOW (ref 12.0–15.0)
Lymphocytes Relative: 16 %
Lymphs Abs: 1.1 10*3/uL (ref 0.7–4.0)
MCH: 25.4 pg — ABNORMAL LOW (ref 26.0–34.0)
MCHC: 29.3 g/dL — ABNORMAL LOW (ref 30.0–36.0)
MCV: 86.7 fL (ref 78.0–100.0)
MONO ABS: 0.4 10*3/uL (ref 0.1–1.0)
Monocytes Relative: 7 %
Neutro Abs: 5.1 10*3/uL (ref 1.7–7.7)
Neutrophils Relative %: 75 %
PLATELETS: 204 10*3/uL (ref 150–400)
RBC: 3.39 MIL/uL — ABNORMAL LOW (ref 3.87–5.11)
RDW: 18.1 % — AB (ref 11.5–15.5)
WBC: 6.7 10*3/uL (ref 4.0–10.5)

## 2017-07-22 LAB — POCT INR: INR: 2.5

## 2017-07-22 MED ORDER — DARBEPOETIN ALFA 300 MCG/0.6ML IJ SOSY
PREFILLED_SYRINGE | INTRAMUSCULAR | Status: AC
  Filled 2017-07-22: qty 0.6

## 2017-07-22 MED ORDER — DARBEPOETIN ALFA 300 MCG/0.6ML IJ SOSY
300.0000 ug | PREFILLED_SYRINGE | Freq: Once | INTRAMUSCULAR | Status: AC
  Administered 2017-07-22: 300 ug via SUBCUTANEOUS

## 2017-07-22 NOTE — Progress Notes (Signed)
[name] presents today for injection per the provider's orders.  Aranesp administration without incident; see MAR for injection details.  Patient tolerated procedure well and without incident.  No questions or complaints noted at this time. Discharged via wheelchair in c/o spouse.

## 2017-07-23 ENCOUNTER — Other Ambulatory Visit: Payer: Self-pay | Admitting: "Endocrinology

## 2017-07-23 ENCOUNTER — Ambulatory Visit (HOSPITAL_COMMUNITY): Payer: Medicare Other | Admitting: Physical Therapy

## 2017-07-23 ENCOUNTER — Other Ambulatory Visit: Payer: Self-pay | Admitting: Cardiology

## 2017-07-23 DIAGNOSIS — R262 Difficulty in walking, not elsewhere classified: Secondary | ICD-10-CM

## 2017-07-23 DIAGNOSIS — N2581 Secondary hyperparathyroidism of renal origin: Secondary | ICD-10-CM | POA: Diagnosis not present

## 2017-07-23 DIAGNOSIS — S81801D Unspecified open wound, right lower leg, subsequent encounter: Secondary | ICD-10-CM | POA: Diagnosis not present

## 2017-07-23 DIAGNOSIS — M79605 Pain in left leg: Secondary | ICD-10-CM | POA: Diagnosis not present

## 2017-07-23 DIAGNOSIS — D631 Anemia in chronic kidney disease: Secondary | ICD-10-CM | POA: Diagnosis not present

## 2017-07-23 DIAGNOSIS — S81802D Unspecified open wound, left lower leg, subsequent encounter: Secondary | ICD-10-CM

## 2017-07-23 DIAGNOSIS — N179 Acute kidney failure, unspecified: Secondary | ICD-10-CM | POA: Diagnosis not present

## 2017-07-23 DIAGNOSIS — M329 Systemic lupus erythematosus, unspecified: Secondary | ICD-10-CM | POA: Diagnosis not present

## 2017-07-23 DIAGNOSIS — N184 Chronic kidney disease, stage 4 (severe): Secondary | ICD-10-CM | POA: Diagnosis not present

## 2017-07-23 NOTE — Therapy (Signed)
[place], [place], [address] Phone: [phone]   Fax:  [phone]  Wound Care Therapy  Patient Details  Name: [name] MRN: [id number] Date of Birth: 09-16-1946 Referring Provider: Sharilyn Sites  Encounter Date: 07/23/2017      PT End of Session - 07/23/17 1523    Visit Number 14   Number of Visits 24   Date for PT Re-Evaluation 07/04/17   Authorization Type [employer] medicare   Authorization - Visit Number 14   Authorization - Number of Visits 20   PT Start Time 1435   PT Stop Time 1500   PT Time Calculation (min) 25 min   Activity Tolerance Patient tolerated treatment well   Behavior During Therapy [facility] for tasks assessed/performed      Past Medical History:  Diagnosis Date  . Anemia   . Asthma   . Atrial flutter [facility])    TEE/DCCV December 2013 - on Xarelto [facility])  . Bone spur of toe    Right 5th toe  . CKD (chronic kidney disease) stage 3, GFR 30-59 ml/min (HCC)   . DVT of upper extremity (deep vein thrombosis) ([place])    Right basilic vein, June 0254  . Essential hypertension, benign   . SLE (systemic lupus erythematosus) ([place])   . Type 2 diabetes mellitus ([place])   . UTI (lower urinary tract infection)     Past Surgical History:  Procedure Laterality Date  . ABDOMINAL HYSTERECTOMY  1983  . ANKLE SURGERY  1993  . AV FISTULA PLACEMENT Left 03/27/2014   Procedure: ARTERIOVENOUS (AV) FISTULA CREATION;  Surgeon: Rosetta Posner, MD;  Location: [place];  Service: Vascular;  Laterality: Left;  . BACK SURGERY  1980  . CARDIOVASCULAR STRESS TEST  12/19/2009   no stress induced rhythm abnormalities, ekg negative for ischemia  . CARDIOVERSION  09/16/2012   Procedure: CARDIOVERSION;  Surgeon: Sanda Klein, MD;  Location: [facility] ENDOSCOPY;  Service: Cardiovascular;  Laterality: N/A;  . COLONOSCOPY  09/20/2012   Dr. [name]: multiple tubular adenomas   . COLONOSCOPY  2005   Dr. [name]. Polyps, path unknown    . COLONOSCOPY N/A 07/24/2016   Procedure: COLONOSCOPY;  Surgeon: [name], MD;  [facility];  Service: Endoscopy;  Laterality: N/A;  9:00 am  . ESOPHAGOGASTRODUODENOSCOPY  09/20/2012   Dr. [name]: duodenal erosion and possible resolving ulcer at the angularis   . TEE WITHOUT CARDIOVERSION  09/16/2012   Procedure: TRANSESOPHAGEAL ECHOCARDIOGRAM (TEE);  Surgeon: Sanda Klein, MD;  Location: [facility] ENDOSCOPY;  Service: Cardiovascular;  Laterality: N/A;  . TRANSESOPHAGEAL ECHOCARDIOGRAM WITH CARDIOVERSION  09/16/2012   EF 60-65%, moderate LVH, moderate regurg of the aortic valve, LA moderately dilated    There were no vitals filed for this visit.                  Wound Therapy - 07/23/17 1519    Subjective Pt reports her legs feel a little better but still hurt.  has not went back to MD yet to discuss results of ABI   Patient and Family Stated Goals wound to heal    Prior Treatments self care, antibiotics    Pain Score 0-No pain   Evaluation and Treatment Procedures Explained to Patient/Family Yes   Evaluation and Treatment Procedures agreed to   Wound Properties Date First Assessed: 06/25/17 Time First Assessed: 1100 Wound Type: Non-pressure wound Location: Leg Location Orientation: Posterior;Medial;Right Wound Description (Comments): blister  Dressing Type Gauze (Comment);Compression wrap   Dressing Status Dry   Dressing Change Frequency PRN   Site / Wound Assessment Red   % Wound base Red or Granulating 15%   % Wound base Yellow/Fibrinous Exudate 85%   Peri-wound Assessment Intact;Erythema (blanchable)   Wound Length (cm) 0.8 cm   Wound Width (cm) 1 cm   Wound Depth (cm) 0.2 cm   Wound Volume (cm^3) 0.16 cm^3   Wound Surface Area (cm^2) 0.8 cm^2   Margins Unattached edges (unapproximated)   Drainage Amount Minimal   Drainage Description Sanguineous   Treatment Cleansed;Debridement (Selective)   Wound Properties Date First Assessed: 06/04/17 Time  First Assessed: 8299 Wound Type: Non-pressure wound Location: Leg Location Orientation: Left;Lateral;Lower Present on Admission: Yes   Dressing Type Gauze (Comment);Compression wrap   Dressing Changed Changed   Dressing Status Old drainage   Dressing Change Frequency PRN   Site / Wound Assessment Yellow;Red;Pink   % Wound base Red or Granulating 80%   % Wound base Yellow/Fibrinous Exudate 20%   % Wound base Black/Eschar 0%   Peri-wound Assessment Pink   Wound Length (cm) 3.1 cm   Wound Width (cm) 3.4 cm   Wound Depth (cm) 0.1 cm   Wound Volume (cm^3) 1.05 cm^3   Wound Surface Area (cm^2) 10.54 cm^2   Margins Unattached edges (unapproximated)   Drainage Amount Scant   Drainage Description Serous   Selective Debridement - Location Both wounds   Selective Debridement - Tools Used Forceps;Scalpel   Selective Debridement - Tissue Removed slough and biofilm    Wound Therapy - Clinical Statement wounds remeasured with reduction noted in Rt LE as compared to last session but no change in Lt LE.  Debrided away thick slough and noted increase in drainage this session.  Lt>Rt with macerated edges.  Changed back to silver hydrofiber.    Wound Therapy - Functional Problem List difficulty in walking    Factors Delaying/Impairing Wound Healing Altered sensation;Diabetes Mellitus;Infection - systemic/local;Multiple medical problems;Polypharmacy;Vascular compromise   Hydrotherapy Plan Debridement;Dressing change;Patient/family education   Wound Plan Continue appropriate wound care.  F/U with ABI.     Dressing  B wounds xeroform, 2X2, medipore #6 netting                   PT Short Term Goals - 07/09/17 1337      PT SHORT TERM GOAL #1   Title Pt to understand signs and symptoms of cellulits and the importance of contacting MD if this occurs    Time 1   Period Weeks   Status Achieved     PT SHORT TERM GOAL #2   Title PT lateral wound to be 75% granulated and to have no black eschar to  reduce the risk of infection.   Baseline --  currently no black eschar; 60% granulated    Time 2   Period Weeks   Status On-going     PT SHORT TERM GOAL #3   Title Pt pain to be decreased to no greater than a 4/10 to allow pt to rest better   Baseline --  07/09/2017:  pain is no greater than a 4/10 except when debriding.  Pt unable to walk 10 minutes due to CHF revised goal to be so that pt can rest better.    Time 3   Period Weeks   Status Revised           PT Long Term Goals - 07/09/17 1340  PT LONG TERM GOAL #1   Title Pt wound to be 100% granulated to prevent infection    Time 4   Period Weeks   Status On-going     PT LONG TERM GOAL #2   Title PT lateral  wound size to be no greater than 2x2 to allow pt to feel comfortable in self care of wound ; anterior wound to be healed    Time 6   Period Weeks   Status On-going     PT LONG TERM GOAL #3   Title If profore felt comfortable to pt pt to be educated in compression garments and why they may be benefical for pt.  Pt has had wounds come up on her Rt leg in the past as well    Baseline --  07/09/2017:  Await result of ABI   Time 6   Period Weeks   Status Deferred             Patient will benefit from skilled therapeutic intervention in order to improve the following deficits and impairments:     Visit Diagnosis: Pain in left leg  Leg wound, left, subsequent encounter  Leg wound, right, subsequent encounter  Difficulty in walking, not elsewhere classified     Problem List Patient Active Problem List   Diagnosis Date Noted  . Heme positive stool 10/04/2015  . Hyperlipidemia 06/29/2015  . Type 2 diabetes mellitus with stage 4 chronic kidney disease ([place]) 06/29/2015  . Encounter for adequacy testing for hemodialysis ([place]) 04/12/2014  . Chronic kidney disease, stage V ([place]) 04/12/2014  . 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 Encounter  for therapeutic drug monitoring 03/30/2014  . Chronic diastolic heart failure ([place]) 03/15/2014  . Renal failure (ARF), acute on chronic (HCC) 03/13/2014  . Anemia of chronic renal failure, stage 4 (severe) ([place]) 02/23/2014  . Folate deficiency 02/17/2014  . Aortic regurgitation 09/19/2012  . Atrial flutter ([place]) 09/14/2012  . Morbid obesity ([place]) 09/14/2012  . Type 2 diabetes mellitus with diabetic neuropathy ([place]) 09/14/2012  . SLE (systemic lupus erythematosus) ([place]) 09/14/2012  . Essential hypertension, benign 09/14/2012  . COPD (chronic obstructive pulmonary disease) ([place]) 09/14/2012   Teena Irani, PTA/CLT [phone]   Teena Irani 07/23/2017, 3:23 PM  [place] [address] [place], [place], [address] Phone: [phone]   Fax:  [phone]  Name: ZENNA TRAISTER MRN: [id number] Date of Birth: 1946-05-31

## 2017-07-24 ENCOUNTER — Ambulatory Visit (INDEPENDENT_AMBULATORY_CARE_PROVIDER_SITE_OTHER): Payer: Medicare Other | Admitting: "Endocrinology

## 2017-07-24 ENCOUNTER — Encounter: Payer: Self-pay | Admitting: "Endocrinology

## 2017-07-24 VITALS — BP 138/74 | HR 97 | Ht 65.0 in | Wt 208.0 lb

## 2017-07-24 DIAGNOSIS — E782 Mixed hyperlipidemia: Secondary | ICD-10-CM

## 2017-07-24 DIAGNOSIS — Z6835 Body mass index (BMI) 35.0-35.9, adult: Secondary | ICD-10-CM

## 2017-07-24 DIAGNOSIS — N184 Chronic kidney disease, stage 4 (severe): Secondary | ICD-10-CM | POA: Diagnosis not present

## 2017-07-24 DIAGNOSIS — E1122 Type 2 diabetes mellitus with diabetic chronic kidney disease: Secondary | ICD-10-CM | POA: Diagnosis not present

## 2017-07-24 DIAGNOSIS — I10 Essential (primary) hypertension: Secondary | ICD-10-CM

## 2017-07-24 DIAGNOSIS — Z794 Long term (current) use of insulin: Secondary | ICD-10-CM | POA: Diagnosis not present

## 2017-07-24 MED ORDER — FREESTYLE LIBRE SENSOR SYSTEM MISC: 2 refills | Status: DC

## 2017-07-24 MED ORDER — FREESTYLE LIBRE READER DEVI: 1.0000 | Freq: Once | 0 refills | Status: AC

## 2017-07-24 NOTE — Patient Instructions (Signed)

## 2017-07-24 NOTE — Progress Notes (Signed)
Subjective:    Patient ID: [name], female    DOB: 04/02/1946,    Past Medical History:  Diagnosis Date  . Anemia   . Asthma   . Atrial flutter [facility])    TEE/DCCV December 2013 - on Xarelto [facility])  . Bone spur of toe    Right 5th toe  . CKD (chronic Little disease) stage 3, GFR 30-59 ml/min (HCC)   . DVT of upper extremity (deep vein thrombosis) ([place])    Right basilic vein, June 6734  . Essential hypertension, benign   . SLE (systemic lupus erythematosus) ([place])   . Type 2 diabetes mellitus ([place])   . UTI (lower urinary tract infection)    Past Surgical History:  Procedure Laterality Date  . ABDOMINAL HYSTERECTOMY  1983  . ANKLE SURGERY  1993  . AV FISTULA PLACEMENT Left 03/27/2014   Procedure: ARTERIOVENOUS (AV) FISTULA CREATION;  Surgeon: Rosetta Posner, MD;  Location: [place];  Service: Vascular;  Laterality: Left;  . BACK SURGERY  1980  . CARDIOVASCULAR STRESS TEST  12/19/2009   no stress induced rhythm abnormalities, ekg negative for ischemia  . CARDIOVERSION  09/16/2012   Procedure: CARDIOVERSION;  Surgeon: Sanda Klein, MD;  Location: [facility] ENDOSCOPY;  Service: Cardiovascular;  Laterality: N/A;  . COLONOSCOPY  09/20/2012   Dr. [name]: multiple tubular adenomas   . COLONOSCOPY  2005   Dr. [name]. Polyps, path unknown   . COLONOSCOPY N/A 07/24/2016   Procedure: COLONOSCOPY;  Surgeon: [name], MD;  [facility];  Service: Endoscopy;  Laterality: N/A;  9:00 am  . ESOPHAGOGASTRODUODENOSCOPY  09/20/2012   Dr. [name]: duodenal erosion and possible resolving ulcer at the angularis   . TEE WITHOUT CARDIOVERSION  09/16/2012   Procedure: TRANSESOPHAGEAL ECHOCARDIOGRAM (TEE);  Surgeon: Sanda Klein, MD;  [facility] ENDOSCOPY;  Service: Cardiovascular;  Laterality: N/A;  . TRANSESOPHAGEAL ECHOCARDIOGRAM WITH CARDIOVERSION  09/16/2012   EF 60-65%, moderate LVH, moderate regurg of the aortic valve, LA moderately dilated   Social History   Social  History  . Marital status: Married    Spouse name: N/A  . Number of children: N/A  . Years of education: N/A   Social History Main Topics  . Smoking status: Former Smoker    Packs/day: 1.00    Years: 30.00    Types: Cigarettes    Quit date: 08/29/2012  . Smokeless tobacco: Never Used     Comment: quit 08-2012  . Alcohol use No  . Drug use: No  . Sexual activity: Yes    Partners: Male   Other Topics Concern  . None   Social History Narrative  . None   Outpatient Encounter Prescriptions as of 07/24/2017  Medication Sig  . acetaminophen (TYLENOL) 500 MG tablet Take 1,000 mg by mouth every 6 (six) hours as needed. For pain  . albuterol (ACCUNEB) 0.63 MG/3ML nebulizer solution Take 1 ampule by nebulization every 6 (six) hours as needed for wheezing or shortness of breath.   Marland Kitchen albuterol (PROVENTIL HFA;VENTOLIN HFA) 108 (90 BASE) MCG/ACT inhaler Inhale 2 puffs into the lungs every 6 (six) hours as needed. For shortness of breath  . amLODipine (NORVASC) 10 MG tablet Take 1 tablet (10 mg total) by mouth at bedtime.  Marland Kitchen atorvastatin (LIPITOR) 40 MG tablet Take 20 mg by mouth at bedtime.   . brimonidine-timolol (COMBIGAN) 0.2-0.5 % ophthalmic solution Place 1 drop into the left eye daily.   . calcitRIOL (ROCALTROL) 0.25 MCG capsule  Take 0.25 mcg by mouth as directed. Take on Mondays Wednesdays and Fridays  . Continuous Blood Gluc Receiver (FREESTYLE LIBRE READER) DEVI 1 Piece by Does not apply route once.  . Continuous Blood Gluc Sensor (FREESTYLE LIBRE SENSOR SYSTEM) MISC Use one sensor every 10 days.  . diclofenac (VOLTAREN) 0.1 % ophthalmic solution Place 1 drop into the left eye daily. Uses at lunch time  . febuxostat (ULORIC) 40 MG tablet Take 40 mg by mouth daily.  . folic acid (FOLVITE) 1 MG tablet Take 1 mg by mouth daily.  . furosemide (LASIX) 80 MG tablet Take 160 mg by mouth 2 (two) times daily.   Marland Kitchen HYDROcodone-acetaminophen (NORCO/VICODIN) 5-325 MG per tablet Take 1-2 tablets  by mouth every 4 (four) hours as needed for moderate pain.  . hydroxychloroquine (PLAQUENIL) 200 MG tablet Take 200 mg by mouth as directed. Take 1 1/2 tablets daily  . Insulin Detemir (LEVEMIR FLEXTOUCH ) Inject 15 Units into the skin at bedtime.  . metoprolol tartrate (LOPRESSOR) 50 MG tablet TAKE ONE TABLET BY MOUTH TWICE DAILY.  Marland Kitchen Misc Natural Products (TART CHERRY ADVANCED) CAPS Take 1 capsule by mouth 2 (two) times daily.  . Multiple Vitamins-Minerals (CENTRUM SILVER PO) Take 1 tablet by mouth daily.  . ONE TOUCH ULTRA TEST test strip TEST 4 TIMES A DAY AS DIRECTED.  Marland Kitchen ONETOUCH DELICA LANCETS 46N MISC USE TO TEST 4 TIMES DAILY.  Marland Kitchen predniSONE (DELTASONE) 5 MG tablet Take 5 mg by mouth daily with breakfast.  . travoprost, benzalkonium, (TRAVATAN) 0.004 % ophthalmic solution Place 1 drop into the left eye at bedtime.  Marland Kitchen UNIFINE PENTIPS 31G X 6 MM MISC USE AS DIRECTED AT BEDTIME WITH LEVEMIR AND WITH SLIDING SCALE INSULIN.  Marland Kitchen Vitamin D, Ergocalciferol, (DRISDOL) 50000 units CAPS capsule TAKE 1 CAPSULE BY MOUTH ONCE WEEKLY.  . warfarin (COUMADIN) 5 MG tablet Take 1 tablet daily  . [DISCONTINUED] HUMALOG KWIKPEN 100 UNIT/ML KiwkPen INJECT UP TO 11 UNITS SUBCUTANEOUSLY BEFORE MEALS THREE TIMES DAILY.   No facility-administered encounter medications on file as of 07/24/2017.    ALLERGIES: Allergies  Allergen Reactions  . Ace Inhibitors Cough   VACCINATION STATUS: Immunization History  Administered Date(s) Administered  . Influenza,inj,Quad PF,6+ Mos 07/03/2014  . Influenza-Unspecified 07/11/2015, 07/08/2016    Diabetes  She presents for her follow-up diabetic visit. She has type 2 diabetes mellitus. Onset time: diagnosed at approx age of 19. Her disease course has been stable. Pertinent negatives for hypoglycemia include no confusion, headaches, pallor or seizures. Associated symptoms include fatigue. Pertinent negatives for diabetes include no chest pain, no polydipsia, no polyphagia  and no polyuria. Symptoms are stable. Diabetic complications include nephropathy. Risk factors for coronary artery disease include dyslipidemia, diabetes mellitus and obesity. She is compliant with treatment most of the time. Her weight is stable. She is following a generally unhealthy diet. She has had a previous visit with a dietitian. She never participates in exercise. Her home blood glucose trend is increasing steadily. Her breakfast blood glucose range is generally 140-180 mg/dl. Her lunch blood glucose range is generally 140-180 mg/dl. Her dinner blood glucose range is generally 140-180 mg/dl. Her overall blood glucose range is 140-180 mg/dl. An ACE inhibitor/angiotensin II receptor blocker is being taken.  Hypertension  Pertinent negatives include no chest pain, headaches, palpitations or shortness of breath.  Hyperlipidemia  Pertinent negatives include no chest pain, myalgias or shortness of breath. Current antihyperlipidemic treatment includes statins.     Review of Systems  Constitutional:  Positive for fatigue. Negative for unexpected weight change.  HENT: Negative for trouble swallowing and voice change.   Eyes: Negative for visual disturbance.  Respiratory: Negative for cough, shortness of breath and wheezing.   Cardiovascular: Negative for chest pain, palpitations and leg swelling.  Gastrointestinal: Negative for diarrhea, nausea and vomiting.  Endocrine: Negative for cold intolerance, heat intolerance, polydipsia, polyphagia and polyuria.  Musculoskeletal: Negative for arthralgias and myalgias.  Skin: Negative for color change, pallor, rash and wound.  Neurological: Negative for seizures and headaches.  Psychiatric/Behavioral: Negative for confusion and suicidal ideas.    Objective:    BP 138/74   Pulse 97   Ht 5\' 5"  (1.651 m)   Wt 208 lb (94.3 kg)   BMI 34.61 kg/m   Wt Readings from Last 3 Encounters:  07/24/17 208 lb (94.3 kg)  06/11/17 210 lb (95.3 kg)  05/06/17 211  lb 12.8 oz (96.1 kg)    Physical Exam  Constitutional: She is oriented to person, place, and time. She appears well-developed.  HENT:  Head: Normocephalic and atraumatic.  Eyes: EOM are normal.  Neck: Normal range of motion. Neck supple. No tracheal deviation present. No thyromegaly present.  Cardiovascular: Normal rate and regular rhythm.   Pulmonary/Chest: Effort normal and breath sounds normal.  Abdominal: Soft. Bowel sounds are normal. There is no tenderness. There is no guarding.  Musculoskeletal: Normal range of motion. She exhibits no edema.  Neurological: She is alert and oriented to person, place, and time. She has normal reflexes. No cranial nerve deficit. Coordination normal.  Skin: Skin is warm and dry. No rash noted. No erythema. No pallor.  Psychiatric: She has a normal mood and affect. Judgment normal.    Chemistry (most recent): Lab Results  Component Value Date   NA 145 (H) 07/17/2017   K 3.6 07/17/2017   CL 112 (H) 07/17/2017   CO2 19 (L) 07/17/2017   BUN 66 (H) 07/17/2017   CREATININE 2.34 (H) 07/17/2017   Diabetic Labs (most recent): Lab Results  Component Value Date   HGBA1C 5.8 (H) 01/09/2017   HGBA1C 5.7 (H) 10/17/2016   HGBA1C 5.7 (H) 07/10/2016   Results for KENYONA, RENA (MRN [id number]) as of 07/24/2017 12:40  Ref. Range 09/19/2015 11:05  TSH Latest Ref Range: 0.450 - 4.500 uIU/mL 2.360  T4,Free(Direct) Latest Ref Range: 0.82 - 1.77 ng/dL 1.42     Assessment & Plan:   1. Type 2 diabetes mellitus with stage 4 chronic Little disease- status post fistula placement for hemodialysis. - She is not started on hemodialysis. Patient is advised to stick to a routine mealtimes to eat 3 meals  a day and avoid unnecessary snacks ( to snack only to correct hypoglycemia). - Her recent labs did not include A1c, during her last visit A1c was 5.8% however that was in sharp discrepancy from her blood glucose profile.   -Her blood glucose profile average  is between 150 and 200 . - She has history of iron deficiency anemia, Getting iron infusion at [facility]. Patient is advised to eliminate simple carbs  from their diet including cakes desserts ice cream soda (  diet and regular) , sweet tea , Candies,  chips and cookies, artificial sweeteners,   and "sugar-free" products .  This will help patient to have stable blood glucose profile and potentially lose weight. Patient is given detailed personalized glucose monitoring and insulin dosing instructions. Patient is instructed to call back with extremes of blood glucose less than 70 or  greater than 300. Patient to bring meter and  blood glucose logs during their next visit.  I will continue Levemir 15 units daily at bedtime,  continue humalog 5 units TIDAC plus correction. -She has a chance to come off of Humalog if she is willing to avoid concentrated carbohydrates such as soda. She will benefit from continuous glucose monitoring.-I discussed and initiated a prescription for the [facility] device for her. - She will call clinic if she registers greater than 300 mg/dL.  2. Essential hypertension, benign -Controlled.  I have advised her to continue current medications including ARB.  3. Hyperlipemia No recent fasting lipid panel.  I have advised her to continue atorvastatatin.  4. Morbid obesity Carbs advise given. See above #1.  5. vitamin D deficiency.  -Continue current Vitamin D supplement.  6) Chronic Care/Health Maintenance:  -Patient is on  Statin medications and encouraged to continue to follow up with Ophthalmology, nephrology for end-stage renal disease status post fistula placement, Podiatrist at least yearly or according to recommendations, and advised to   stay away from smoking. I have recommended yearly flu vaccine and pneumonia vaccination at least every 5 years; moderate intensity exercise for up to 150 minutes weekly; and  sleep for at least 7 hours a day.  - Time spent  with the patient: 25 min, of which >50% was spent in reviewing her sugar logs , discussing her hypo- and hyper-glycemic episodes, reviewing her current and  previous labs and insulin doses and developing a plan to avoid hypo- and hyper-glycemia.    Follow up plan: Return in about 3 months (around 10/24/2017) for follow up with pre-visit labs, meter, and logs.  Glade Lloyd, MD Phone: [phone]  Fax: [phone]  -  This note was partially dictated with voice recognition software. Similar sounding words can be transcribed inadequately or may not  be corrected upon review.  07/24/2017, 12:39 PM

## 2017-07-27 DIAGNOSIS — E1151 Type 2 diabetes mellitus with diabetic peripheral angiopathy without gangrene: Secondary | ICD-10-CM | POA: Diagnosis not present

## 2017-07-27 DIAGNOSIS — E1140 Type 2 diabetes mellitus with diabetic neuropathy, unspecified: Secondary | ICD-10-CM | POA: Diagnosis not present

## 2017-07-27 DIAGNOSIS — M75101 Unspecified rotator cuff tear or rupture of right shoulder, not specified as traumatic: Secondary | ICD-10-CM | POA: Diagnosis not present

## 2017-07-27 DIAGNOSIS — D179 Benign lipomatous neoplasm, unspecified: Secondary | ICD-10-CM | POA: Diagnosis not present

## 2017-07-27 DIAGNOSIS — L89892 Pressure ulcer of other site, stage 2: Secondary | ICD-10-CM | POA: Diagnosis not present

## 2017-07-27 DIAGNOSIS — M7061 Trochanteric bursitis, right hip: Secondary | ICD-10-CM | POA: Diagnosis not present

## 2017-07-28 ENCOUNTER — Ambulatory Visit (HOSPITAL_COMMUNITY): Payer: Medicare Other | Admitting: Physical Therapy

## 2017-07-28 DIAGNOSIS — S81801D Unspecified open wound, right lower leg, subsequent encounter: Secondary | ICD-10-CM | POA: Diagnosis not present

## 2017-07-28 DIAGNOSIS — S81802D Unspecified open wound, left lower leg, subsequent encounter: Secondary | ICD-10-CM | POA: Diagnosis not present

## 2017-07-28 DIAGNOSIS — M79605 Pain in left leg: Secondary | ICD-10-CM | POA: Diagnosis not present

## 2017-07-28 DIAGNOSIS — R262 Difficulty in walking, not elsewhere classified: Secondary | ICD-10-CM

## 2017-07-28 NOTE — Therapy (Signed)
[place], [place], [address] Phone: [phone]   Fax:  [phone]  Wound Care Therapy  Patient Details  Name: [name] MRN: [id number] Date of Birth: 1946-02-18 Referring Provider: Sharilyn Sites  Encounter Date: 07/28/2017      PT End of Session - 07/28/17 1213    Visit Number 15   Number of Visits 24   Date for PT Re-Evaluation 07/04/17   Authorization Type UHC medicare   Authorization - Visit Number 15   Authorization - Number of Visits 20   PT Start Time 1120   PT Stop Time 1150   PT Time Calculation (min) 30 min   Activity Tolerance Patient tolerated treatment well   Behavior During Therapy [facility] for tasks assessed/performed      Past Medical History:  Diagnosis Date  . Anemia   . Asthma   . Atrial flutter [facility])    TEE/DCCV December 2013 - on Xarelto [facility])  . Bone spur of toe    Right 5th toe  . CKD (chronic kidney disease) stage 3, GFR 30-59 ml/min (HCC)   . DVT of upper extremity (deep vein thrombosis) ([name])    Right basilic vein, June 2952  . Essential hypertension, benign   . SLE (systemic lupus erythematosus) ([place])   . Type 2 diabetes mellitus ([place])   . UTI (lower urinary tract infection)     Past Surgical History:  Procedure Laterality Date  . ABDOMINAL HYSTERECTOMY  1983  . ANKLE SURGERY  1993  . AV FISTULA PLACEMENT Left 03/27/2014   Procedure: ARTERIOVENOUS (AV) FISTULA CREATION;  Surgeon: Dana Posner, MD;  Location: [place];  Service: Vascular;  Laterality: Left;  . BACK SURGERY  1980  . CARDIOVASCULAR STRESS TEST  12/19/2009   no stress induced rhythm abnormalities, ekg negative for ischemia  . CARDIOVERSION  09/16/2012   Procedure: CARDIOVERSION;  Surgeon: Dana Klein, MD;  Location[facility] ENDOSCOPY;  Service: Cardiovascular;  Laterality: N/A;  . COLONOSCOPY  09/20/2012   Dr. Cristina Little: multiple tubular adenomas   . COLONOSCOPY  2005   Dr. Gala Little. Polyps, path unknown    . COLONOSCOPY N/A 07/24/2016   Procedure: COLONOSCOPY;  Surgeon: Dana Dolin, MD;  [facility];  Service: Endoscopy;  Laterality: N/A;  9:00 am  . ESOPHAGOGASTRODUODENOSCOPY  09/20/2012   Dr. Cristina Little: duodenal erosion and possible resolving ulcer at the angularis   . TEE WITHOUT CARDIOVERSION  09/16/2012   Procedure: TRANSESOPHAGEAL ECHOCARDIOGRAM (TEE);  Surgeon: Dana Klein, MD;  Location: [facility] ENDOSCOPY;  Service: Cardiovascular;  Laterality: N/A;  . TRANSESOPHAGEAL ECHOCARDIOGRAM WITH CARDIOVERSION  09/16/2012   EF 60-65%, moderate LVH, moderate regurg of the aortic valve, LA moderately dilated    There were no vitals filed for this visit.                  Wound Therapy - 07/28/17 1208    Subjective Pt states she still has not heard from her MD about ABI.  Recommended she call or go by their office as it has been nearly 2 weeks   Patient and Family Stated Goals wound to heal    Prior Treatments self care, antibiotics    Pain Score 0-No pain   Evaluation and Treatment Procedures Explained to Patient/Family Yes   Evaluation and Treatment Procedures agreed to   Wound Properties Date First Assessed: 06/25/17 Time First Assessed: 1100 Wound Type: Non-pressure wound Location: Leg Location Orientation: Posterior;Medial;Right Wound Description (  Comments): blister   Dressing Type Gauze (Comment);Compression wrap   Dressing Status Dry   Dressing Change Frequency PRN   Site / Wound Assessment Red   % Wound base Red or Granulating 15%   % Wound base Yellow/Fibrinous Exudate 85%   Peri-wound Assessment Intact;Erythema (blanchable)   Margins Unattached edges (unapproximated)   Drainage Amount Minimal   Drainage Description Sanguineous   Treatment Cleansed;Debridement (Selective)   Wound Properties Date First Assessed: 06/04/17 Time First Assessed: 1761 Wound Type: Non-pressure wound Location: Leg Location Orientation: Left;Lateral;Lower Present on Admission: Yes    Dressing Type Gauze (Comment);Compression wrap   Dressing Changed Changed   Dressing Status Old drainage   Dressing Change Frequency PRN   Site / Wound Assessment Yellow;Red;Pink   % Wound base Red or Granulating 80%   % Wound base Yellow/Fibrinous Exudate 20%   % Wound base Black/Eschar 0%   Peri-wound Assessment Pink   Margins Unattached edges (unapproximated)   Drainage Amount Scant   Drainage Description Serous   Treatment Cleansed;Debridement (Selective)   Selective Debridement - Location Both wounds   Selective Debridement - Tools Used Forceps;Scalpel   Selective Debridement - Tissue Removed slough and biofilm    Wound Therapy - Clinical Statement Wounds continue to improve (more so Lt than Rt).  Pt with bandaid on little toe on Rt LE stating podiatirist is treating this area.  Debrided most all slough, however with some pain voiced.  Pt became tearful in frustration in the progress of her legs.  Recommended she call or stop by MD office regarding her ABI results.  Noted with small blister on medial anterior aspect of Lt LE, however skin is not broken.  Instruced pateint to watch this area.   Wound Therapy - Functional Problem List difficulty in walking    Factors Delaying/Impairing Wound Healing Altered sensation;Diabetes Mellitus;Infection - systemic/local;Multiple medical problems;Polypharmacy;Vascular compromise   Hydrotherapy Plan Debridement;Dressing change;Patient/family education   Wound Plan Continue appropriate wound care.  F/U with ABI.     Dressing  B wounds silver hydrofiber, 2X2, medipore #6 netting                   PT Short Term Goals - 07/09/17 1337      PT SHORT TERM GOAL #1   Title Pt to understand signs and symptoms of cellulits and the importance of contacting MD if this occurs    Time 1   Period Weeks   Status Achieved     PT SHORT TERM GOAL #2   Title PT lateral wound to be 75% granulated and to have no black eschar to reduce the risk of  infection.   Baseline --  currently no black eschar; 60% granulated    Time 2   Period Weeks   Status On-going     PT SHORT TERM GOAL #3   Title Pt pain to be decreased to no greater than a 4/10 to allow pt to rest better   Baseline --  07/09/2017:  pain is no greater than a 4/10 except when debriding.  Pt unable to walk 10 minutes due to CHF revised goal to be so that pt can rest better.    Time 3   Period Weeks   Status Revised           PT Long Term Goals - 07/09/17 1340      PT LONG TERM GOAL #1   Title Pt wound to be 100% granulated to prevent infection  Time 4   Period Weeks   Status On-going     PT LONG TERM GOAL #2   Title PT lateral  wound size to be no greater than 2x2 to allow pt to feel comfortable in self care of wound ; anterior wound to be healed    Time 6   Period Weeks   Status On-going     PT LONG TERM GOAL #3   Title If profore felt comfortable to pt pt to be educated in compression garments and why they may be benefical for pt.  Pt has had wounds come up on her Rt leg in the past as well    Baseline --  07/09/2017:  Await result of ABI   Time 6   Period Weeks   Status Deferred             Patient will benefit from skilled therapeutic intervention in order to improve the following deficits and impairments:     Visit Diagnosis: Pain in left leg  Leg wound, left, subsequent encounter  Leg wound, right, subsequent encounter  Difficulty in walking, not elsewhere classified     Problem List Patient Active Problem List   Diagnosis Date Noted  . Heme positive stool 10/04/2015  . Mixed hyperlipidemia 06/29/2015  . Type 2 diabetes mellitus with stage 4 chronic kidney disease ([place]) 06/29/2015  . Encounter for adequacy testing for hemodialysis ([place]) 04/12/2014  . Chronic kidney disease, stage V ([place]) 04/12/2014  . 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 Encounter for  therapeutic drug monitoring 03/30/2014  . Chronic diastolic heart failure ([place]) 03/15/2014  . Renal failure (ARF), acute on chronic (HCC) 03/13/2014  . Anemia of chronic renal failure, stage 4 (severe) ([place]) 02/23/2014  . Folate deficiency 02/17/2014  . Aortic regurgitation 09/19/2012  . Atrial flutter ([place]) 09/14/2012  . Class 2 severe obesity due to excess calories with serious comorbidity and body mass index (BMI) of 35.0 to 35.9 in adult ([place]) 09/14/2012  . Type 2 diabetes mellitus with diabetic neuropathy ([place]) 09/14/2012  . SLE (systemic lupus erythematosus) ([place]) 09/14/2012  . Essential hypertension, benign 09/14/2012  . COPD (chronic obstructive pulmonary disease) ([place]) 09/14/2012   Teena Irani, PTA/CLT [phone]  Teena Irani 07/28/2017, 12:14 PM  [facility] [address] [place], [place], [address] Phone: [phone]   Fax:  [phone]  Name: LAKESA COSTE MRN: [id number] Date of Birth: 11-Mar-1946

## 2017-07-29 ENCOUNTER — Encounter (HOSPITAL_COMMUNITY): Payer: Medicare Other

## 2017-07-29 ENCOUNTER — Encounter (HOSPITAL_COMMUNITY): Payer: Self-pay

## 2017-07-29 ENCOUNTER — Encounter (HOSPITAL_BASED_OUTPATIENT_CLINIC_OR_DEPARTMENT_OTHER): Payer: Medicare Other

## 2017-07-29 VITALS — BP 90/72 | HR 69 | Temp 97.8°F | Resp 18

## 2017-07-29 DIAGNOSIS — N184 Chronic kidney disease, stage 4 (severe): Secondary | ICD-10-CM | POA: Diagnosis not present

## 2017-07-29 DIAGNOSIS — D631 Anemia in chronic kidney disease: Secondary | ICD-10-CM

## 2017-07-29 DIAGNOSIS — N058 Unspecified nephritic syndrome with other morphologic changes: Secondary | ICD-10-CM

## 2017-07-29 LAB — CBC WITH DIFFERENTIAL/PLATELET
BASOS ABS: 0 10*3/uL (ref 0.0–0.1)
BASOS PCT: 0 %
EOS ABS: 0.1 10*3/uL (ref 0.0–0.7)
EOS PCT: 1 %
HCT: 28.2 % — ABNORMAL LOW (ref 36.0–46.0)
HEMOGLOBIN: 8.4 g/dL — AB (ref 12.0–15.0)
LYMPHS ABS: 0.6 10*3/uL — AB (ref 0.7–4.0)
Lymphocytes Relative: 7 %
MCH: 25.2 pg — ABNORMAL LOW (ref 26.0–34.0)
MCHC: 29.8 g/dL — ABNORMAL LOW (ref 30.0–36.0)
MCV: 84.7 fL (ref 78.0–100.0)
Monocytes Absolute: 0.6 10*3/uL (ref 0.1–1.0)
Monocytes Relative: 8 %
NEUTROS PCT: 84 %
Neutro Abs: 6.5 10*3/uL (ref 1.7–7.7)
PLATELETS: 233 10*3/uL (ref 150–400)
RBC: 3.33 MIL/uL — AB (ref 3.87–5.11)
RDW: 18.1 % — ABNORMAL HIGH (ref 11.5–15.5)
WBC: 7.8 10*3/uL (ref 4.0–10.5)

## 2017-07-29 MED ORDER — DARBEPOETIN ALFA 300 MCG/0.6ML IJ SOSY
300.0000 ug | PREFILLED_SYRINGE | Freq: Once | INTRAMUSCULAR | Status: AC
  Administered 2017-07-29: 300 ug via SUBCUTANEOUS
  Filled 2017-07-29: qty 0.6

## 2017-07-29 MED ORDER — DARBEPOETIN ALFA 300 MCG/0.6ML IJ SOSY
PREFILLED_SYRINGE | INTRAMUSCULAR | Status: AC
  Filled 2017-07-29: qty 0.6

## 2017-07-29 NOTE — Progress Notes (Signed)
[name] presents today for injection per the provider's orders.  Aranesp administration without incident; see MAR for injection details.  Patient tolerated procedure well and without incident.  No questions or complaints noted at this time.  Discharged via wheelchair in c/o spouse.

## 2017-07-30 ENCOUNTER — Encounter (HOSPITAL_COMMUNITY): Payer: Self-pay

## 2017-07-30 ENCOUNTER — Ambulatory Visit (HOSPITAL_COMMUNITY): Payer: Medicare Other | Attending: Family Medicine

## 2017-07-30 DIAGNOSIS — M79605 Pain in left leg: Secondary | ICD-10-CM | POA: Diagnosis not present

## 2017-07-30 DIAGNOSIS — R262 Difficulty in walking, not elsewhere classified: Secondary | ICD-10-CM | POA: Diagnosis not present

## 2017-07-30 DIAGNOSIS — S81802D Unspecified open wound, left lower leg, subsequent encounter: Secondary | ICD-10-CM | POA: Insufficient documentation

## 2017-07-30 DIAGNOSIS — S81801D Unspecified open wound, right lower leg, subsequent encounter: Secondary | ICD-10-CM | POA: Insufficient documentation

## 2017-07-30 NOTE — Therapy (Signed)
[place], [place], [address] Phone: [phone]   Fax:  [phone]  Wound Care Therapy  Patient Details  Name: [name] MRN: [id number] Date of Birth: [birth year] Referring Provider: Sharilyn Sites  Encounter Date: 07/30/2017      PT End of Session - 07/30/17 1449    Visit Number 16   Number of Visits 24   Date for PT Re-Evaluation 08/04/17   Authorization Type UHC medicare   Authorization - Visit Number 16   Authorization - Number of Visits 20   PT Start Time 1300   PT Stop Time 1335   PT Time Calculation (min) 35 min   Activity Tolerance Patient tolerated treatment well   Behavior During Therapy [facility] for tasks assessed/performed      Past Medical History:  Diagnosis Date  . Anemia   . Asthma   . Atrial flutter [facility])    TEE/DCCV December 2013 - on Xarelto [facility])  . Bone spur of toe    Right 5th toe  . CKD (chronic kidney disease) stage 3, GFR 30-59 ml/min (HCC)   . DVT of upper extremity (deep vein thrombosis) ([place])    Right basilic vein, June 4259  . Essential hypertension, benign   . SLE (systemic lupus erythematosus) ([place])   . Type 2 diabetes mellitus ([place])   . UTI (lower urinary tract infection)     Past Surgical History:  Procedure Laterality Date  . ABDOMINAL HYSTERECTOMY  1983  . ANKLE SURGERY  1993  . AV FISTULA PLACEMENT Left 03/27/2014   Procedure: ARTERIOVENOUS (AV) FISTULA CREATION;  Surgeon: Rosetta Posner, MD;  Location: [place];  Service: Vascular;  Laterality: Left;  . BACK SURGERY  1980  . CARDIOVASCULAR STRESS TEST  12/19/2009   no stress induced rhythm abnormalities, ekg negative for ischemia  . CARDIOVERSION  09/16/2012   Procedure: CARDIOVERSION;  Surgeon: Sanda Klein, MD;  Location: [facility] ENDOSCOPY;  Service: Cardiovascular;  Laterality: N/A;  . COLONOSCOPY  09/20/2012   Dr. [name]: multiple tubular adenomas   . COLONOSCOPY  2005   Dr. [name]. Polyps, path unknown    . COLONOSCOPY N/A 07/24/2016   Procedure: COLONOSCOPY;  Surgeon: [name], MD;  [facility];  Service: Endoscopy;  Laterality: N/A;  9:00 am  . ESOPHAGOGASTRODUODENOSCOPY  09/20/2012   Dr. [name]: duodenal erosion and possible resolving ulcer at the angularis   . TEE WITHOUT CARDIOVERSION  09/16/2012   Procedure: TRANSESOPHAGEAL ECHOCARDIOGRAM (TEE);  Surgeon: Sanda Klein, MD;  Location: [facility] ENDOSCOPY;  Service: Cardiovascular;  Laterality: N/A;  . TRANSESOPHAGEAL ECHOCARDIOGRAM WITH CARDIOVERSION  09/16/2012   EF 60-65%, moderate LVH, moderate regurg of the aortic valve, LA moderately dilated    There were no vitals filed for this visit.       Subjective Assessment - 07/30/17 1336    Subjective Pt reports she got call about ABI, has apt scheduled to discuss findings in [place] in December.  Stated LE feel good today, no pain.     Currently in Pain? No/denies          [facility] PT Assessment - 07/30/17 0001      Assessment   Medical Diagnosis Non-healing Lt LE wound    Referring Provider Sharilyn Sites   Onset Date/Surgical Date 05/19/17   Next MD Visit unknown   Prior Therapy self care; 2 antibiotics      Precautions   Precautions None  Wound Therapy - 07/30/17 1337    Subjective Pt reports she got call about ABI, has apt scheduled to discuss findings in [place] in December.  Stated LE feel good today, no pain.     Patient and Family Stated Goals wound to heal    Prior Treatments self care, antibiotics    Pain Assessment No/denies pain  has pain with debridment only   Pain Score 0-No pain   Evaluation and Treatment Procedures Explained to Patient/Family Yes   Evaluation and Treatment Procedures agreed to   Wound Properties Date First Assessed: 06/25/17 Time First Assessed: 1100 Wound Type: Non-pressure wound Location: Leg Location Orientation: Posterior;Medial;Right Wound Description (Comments): blister   Dressing Type  Silver hydrofiber;Gauze (Comment);Tape dressing  silver hydrofiber, 2x2, medipore tape   Dressing Changed Changed   Dressing Status Old drainage   Dressing Change Frequency PRN   Site / Wound Assessment Red   % Wound base Red or Granulating 15%   % Wound base Yellow/Fibrinous Exudate 85%   % Wound base Black/Eschar 0%   Peri-wound Assessment Intact;Erythema (blanchable)   Wound Length (cm) 0.8 cm   Wound Width (cm) 1 cm   Wound Depth (cm) 0.2 cm   Wound Volume (cm^3) 0.16 cm^3   Wound Surface Area (cm^2) 0.8 cm^2   Margins Unattached edges (unapproximated)   Drainage Amount Minimal   Drainage Description Sanguineous   Treatment Cleansed;Debridement (Selective)   Wound Properties Date First Assessed: 06/04/17 Time First Assessed: 3235 Wound Type: Non-pressure wound Location: Leg Location Orientation: Left;Lateral;Lower Present on Admission: Yes   Dressing Type Silver hydrofiber;Gauze (Comment);Tape dressing  silver hydrofiber, 4x4, medipore tape   Dressing Changed Changed   Dressing Status Old drainage   Dressing Change Frequency PRN   Site / Wound Assessment Yellow;Red;Pink;Granulation tissue  granulation tissue on inferior border of wound   % Wound base Red or Granulating 80%   % Wound base Yellow/Fibrinous Exudate 20%   % Wound base Black/Eschar 0%   Peri-wound Assessment Pink   Wound Length (cm) 3.3 cm   Wound Width (cm) 3.2 cm   Wound Depth (cm) 0.1 cm  superior border of wound only   Wound Volume (cm^3) 1.06 cm^3   Wound Surface Area (cm^2) 10.56 cm^2   Margins Unattached edges (unapproximated)   Drainage Amount Scant   Drainage Description Serous   Treatment Cleansed;Debridement (Selective)   Selective Debridement - Location Both wounds   Selective Debridement - Tools Used Forceps;Scalpel   Selective Debridement - Tissue Removed slough and biofilm    Wound Therapy - Clinical Statement Wounds continues to improve (more Lt than Rt).  Pt does continues to be limited by  pain with debridement with noted moans and noted tears on eyes though reports she tolerated session well.  Selective debridment for removal of slough from wound bed.  Noted improved granulation tissue inferior border of Lt LE.  2 lighter/gray spots noted near wound on Lt LE, continue to monitor.     Wound Therapy - Functional Problem List difficulty in walking    Factors Delaying/Impairing Wound Healing Altered sensation;Diabetes Mellitus;Infection - systemic/local;Multiple medical problems;Polypharmacy;Vascular compromise   Hydrotherapy Plan Debridement;Dressing change;Patient/family education   Wound Therapy - Frequency --  2x/week for 6 weeks   Wound Therapy - Current Recommendations PT   Wound Plan Continue appropriate wound care.  F/U with ABI apt scheduled for Decmber    Dressing  B wounds silver hydrofiber, 2X2, medipore #6 netting  PT Short Term Goals - 07/09/17 1337      PT SHORT TERM GOAL #1   Title Pt to understand signs and symptoms of cellulits and the importance of contacting MD if this occurs    Time 1   Period Weeks   Status Achieved     PT SHORT TERM GOAL #2   Title PT lateral wound to be 75% granulated and to have no black eschar to reduce the risk of infection.   Baseline --  currently no black eschar; 60% granulated    Time 2   Period Weeks   Status On-going     PT SHORT TERM GOAL #3   Title Pt pain to be decreased to no greater than a 4/10 to allow pt to rest better   Baseline --  07/09/2017:  pain is no greater than a 4/10 except when debriding.  Pt unable to walk 10 minutes due to CHF revised goal to be so that pt can rest better.    Time 3   Period Weeks   Status Revised           PT Long Term Goals - 07/09/17 1340      PT LONG TERM GOAL #1   Title Pt wound to be 100% granulated to prevent infection    Time 4   Period Weeks   Status On-going     PT LONG TERM GOAL #2   Title PT lateral  wound size to be no greater  than 2x2 to allow pt to feel comfortable in self care of wound ; anterior wound to be healed    Time 6   Period Weeks   Status On-going     PT LONG TERM GOAL #3   Title If profore felt comfortable to pt pt to be educated in compression garments and why they may be benefical for pt.  Pt has had wounds come up on her Rt leg in the past as well    Baseline --  07/09/2017:  Await result of ABI   Time 6   Period Weeks   Status Deferred             Patient will benefit from skilled therapeutic intervention in order to improve the following deficits and impairments:     Visit Diagnosis: Pain in left leg  Leg wound, left, subsequent encounter  Leg wound, right, subsequent encounter  Difficulty in walking, not elsewhere classified     Problem List Patient Active Problem List   Diagnosis Date Noted  . Heme positive stool 10/04/2015  . Mixed hyperlipidemia 06/29/2015  . Type 2 diabetes mellitus with stage 4 chronic kidney disease ([place]) 06/29/2015  . Encounter for adequacy testing for hemodialysis ([place]) 04/12/2014  . Chronic kidney disease, stage V ([place]) 04/12/2014  . 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 Encounter for therapeutic drug monitoring 03/30/2014  . Chronic diastolic heart failure ([place]) 03/15/2014  . Renal failure (ARF), acute on chronic (HCC) 03/13/2014  . Anemia of chronic renal failure, stage 4 (severe) ([place]) 02/23/2014  . Folate deficiency 02/17/2014  . Aortic regurgitation 09/19/2012  . Atrial flutter ([place]) 09/14/2012  . Class 2 severe obesity due to excess calories with serious comorbidity and body mass index (BMI) of 35.0 to 35.9 in adult ([place]) 09/14/2012  . Type 2 diabetes mellitus with diabetic neuropathy ([place]) 09/14/2012  . SLE (systemic lupus erythematosus) ([place]) 09/14/2012  . Essential hypertension, benign 09/14/2012  . COPD (chronic obstructive  pulmonary disease) ([place]) 09/14/2012   Ihor Austin, [place]; [place]  Aldona Lento 07/30/2017, 4:24 PM  [facility] [address] [place], [place], [address] Phone: [phone]   Fax:  [phone]  Name: ERCELL PERLMAN MRN: [id number] Date of Birth: [birth year]

## 2017-07-31 ENCOUNTER — Other Ambulatory Visit: Payer: Self-pay

## 2017-07-31 DIAGNOSIS — L97519 Non-pressure chronic ulcer of other part of right foot with unspecified severity: Secondary | ICD-10-CM

## 2017-08-04 ENCOUNTER — Telehealth: Payer: Self-pay

## 2017-08-04 ENCOUNTER — Ambulatory Visit (HOSPITAL_COMMUNITY): Payer: Medicare Other | Admitting: Physical Therapy

## 2017-08-04 DIAGNOSIS — R262 Difficulty in walking, not elsewhere classified: Secondary | ICD-10-CM

## 2017-08-04 DIAGNOSIS — M79605 Pain in left leg: Secondary | ICD-10-CM

## 2017-08-04 DIAGNOSIS — S81802D Unspecified open wound, left lower leg, subsequent encounter: Secondary | ICD-10-CM | POA: Diagnosis not present

## 2017-08-04 DIAGNOSIS — S81801D Unspecified open wound, right lower leg, subsequent encounter: Secondary | ICD-10-CM | POA: Diagnosis not present

## 2017-08-04 NOTE — Therapy (Signed)
[place], [place], [address] Phone: [phone]   Fax:  [phone]  Wound Care Therapy  Patient Details  Name: [name] MRN: [id number] Date of Birth: [birth year] Referring Provider: Sharilyn Sites   Encounter Date: 08/04/2017  PT End of Session - 08/04/17 1726    Visit Number  17    Number of Visits  24    Date for PT Re-Evaluation  08/04/17    Authorization Type  [employer] medicare    Authorization - Visit Number  17    Authorization - Number of Visits  20    PT Start Time  1525    PT Stop Time  1600    PT Time Calculation (min)  35 min    Activity Tolerance  Patient tolerated treatment well    Behavior During Therapy  [facility] for tasks assessed/performed       Past Medical History:  Diagnosis Date  . Anemia   . Asthma   . Atrial flutter [facility])    TEE/DCCV December 2013 - on Xarelto [facility])  . Bone spur of toe    Right 5th toe  . CKD (chronic kidney disease) stage 3, GFR 30-59 ml/min (HCC)   . DVT of upper extremity (deep vein thrombosis) ([place])    Right basilic vein, June 9233  . Essential hypertension, benign   . SLE (systemic lupus erythematosus) ([place])   . Type 2 diabetes mellitus ([place])   . UTI (lower urinary tract infection)     Past Surgical History:  Procedure Laterality Date  . ABDOMINAL HYSTERECTOMY  1983  . ANKLE SURGERY  1993  . BACK SURGERY  1980  . CARDIOVASCULAR STRESS TEST  12/19/2009   no stress induced rhythm abnormalities, ekg negative for ischemia  . COLONOSCOPY  2005   Dr. [name]. Polyps, path unknown   . TRANSESOPHAGEAL ECHOCARDIOGRAM WITH CARDIOVERSION  09/16/2012   EF 60-65%, moderate LVH, moderate regurg of the aortic valve, LA moderately dilated    There were no vitals filed for this visit.              Wound Therapy - 08/04/17 1722    Subjective  Pt states her MD wants her to get more tests. No pain currently.    Patient and Family Stated Goals  wound to  heal     Prior Treatments  self care, antibiotics     Pain Assessment  No/denies pain    Evaluation and Treatment Procedures Explained to Patient/Family  Yes    Evaluation and Treatment Procedures  agreed to    Wound Properties Date First Assessed: 06/25/17 Time First Assessed: 1100 Wound Type: Non-pressure wound Location: Leg Location Orientation: Posterior;Medial;Right Wound Description (Comments): blister   Dressing Type  Silver hydrofiber;Gauze (Comment);Tape dressing silver hydrofiber, 2x2, medipore tape   silver hydrofiber, 2x2, medipore tape   Dressing Status  Old drainage    Dressing Change Frequency  PRN    Site / Wound Assessment  Red    % Wound base Red or Granulating  15%    % Wound base Yellow/Fibrinous Exudate  85%    % Wound base Black/Eschar  0%    Peri-wound Assessment  Intact;Erythema (blanchable)    Wound Length (cm)  0.8 cm    Wound Width (cm)  1.4 cm    Wound Depth (cm)  0.2 cm    Wound Volume (cm^3)  0.22 cm^3    Wound Surface Area (cm^2)  1.12 cm^2  Margins  Unattached edges (unapproximated)    Drainage Amount  Minimal    Drainage Description  Sanguineous    Wound Properties Date First Assessed: 06/04/17 Time First Assessed: 7782 Wound Type: Non-pressure wound Location: Leg Location Orientation: Left;Lateral;Lower Present on Admission: Yes   Dressing Type  Silver hydrofiber;Gauze (Comment);Tape dressing silver hydrofiber, 4x4, medipore tape   silver hydrofiber, 4x4, medipore tape   Dressing Status  Old drainage    Dressing Change Frequency  PRN    Site / Wound Assessment  Yellow;Red;Pink;Granulation tissue granulation tissue on inferior border of wound   granulation tissue on inferior border of wound   % Wound base Red or Granulating  80%    % Wound base Yellow/Fibrinous Exudate  20%    % Wound base Black/Eschar  0%    Peri-wound Assessment  Pink    Wound Length (cm)  3.3 cm    Wound Width (cm)  3.2 cm    Wound Depth (cm)  0.1 cm    Wound Volume (cm^3)   1.06 cm^3    Wound Surface Area (cm^2)  10.56 cm^2    Margins  Unattached edges (unapproximated)    Drainage Amount  Scant    Drainage Description  Serous    Treatment  Cleansed;Debridement (Selective)    Selective Debridement - Location  Both wounds    Selective Debridement - Tools Used  Forceps;Scalpel    Selective Debridement - Tissue Removed  slough and biofilm     Wound Therapy - Clinical Statement  Right wound measuring wider today with not progress in granulation following debridement.  Lt LE appears more swollen today and without change in measurements.  Did observe more approximation distally, however.  Pt tolerated debridement better today.  cotniued with silver hydrofiber dressings and medipore tape.     Wound Therapy - Functional Problem List  difficulty in walking     Factors Delaying/Impairing Wound Healing  Altered sensation;Diabetes Mellitus;Infection - systemic/local;Multiple medical problems;Polypharmacy;Vascular compromise    Hydrotherapy Plan  Debridement;Dressing change;Patient/family education    Wound Therapy - Frequency  -- 2x/week for 6 weeks   2x/week for 6 weeks   Wound Therapy - Current Recommendations  PT    Wound Plan  Continue appropriate wound care.  ABI sceduled in December.  Follow up with edema and dressings.    Dressing   B wounds silver hydrofiber, 2X2, medipore #6 netting                PT Short Term Goals - 07/09/17 1337      PT SHORT TERM GOAL #1   Title  Pt to understand signs and symptoms of cellulits and the importance of contacting MD if this occurs     Time  1    Period  Weeks    Status  Achieved      PT SHORT TERM GOAL #2   Title  PT lateral wound to be 75% granulated and to have no black eschar to reduce the risk of infection.    Baseline  -- currently no black eschar; 60% granulated    currently no black eschar; 60% granulated    Time  2    Period  Weeks    Status  On-going      PT SHORT TERM GOAL #3   Title  Pt pain to be  decreased to no greater than a 4/10 to allow pt to rest better    Baseline  -- 07/09/2017:  pain is no greater than  a 4/10 except when debriding.  Pt unable to walk 10 minutes due to CHF revised goal to be so that pt can rest better.    07/09/2017:  pain is no greater than a 4/10 except when debriding.  Pt unable to walk 10 minutes due to CHF revised goal to be so that pt can rest better.    Time  3    Period  Weeks    Status  Revised        PT Long Term Goals - 07/09/17 1340      PT LONG TERM GOAL #1   Title  Pt wound to be 100% granulated to prevent infection     Time  4    Period  Weeks    Status  On-going      PT LONG TERM GOAL #2   Title  PT lateral  wound size to be no greater than 2x2 to allow pt to feel comfortable in self care of wound ; anterior wound to be healed     Time  6    Period  Weeks    Status  On-going      PT LONG TERM GOAL #3   Title  If profore felt comfortable to pt pt to be educated in compression garments and why they may be benefical for pt.  Pt has had wounds come up on her Rt leg in the past as well     Baseline  -- 07/09/2017:  Await result of ABI   07/09/2017:  Await result of ABI   Time  6    Period  Weeks    Status  Deferred              Patient will benefit from skilled therapeutic intervention in order to improve the following deficits and impairments:     Visit Diagnosis: Pain in left leg  Leg wound, left, subsequent encounter  Leg wound, right, subsequent encounter  Difficulty in walking, not elsewhere classified     Problem List Patient Active Problem List   Diagnosis Date Noted  . Heme positive stool 10/04/2015  . Mixed hyperlipidemia 06/29/2015  . Type 2 diabetes mellitus with stage 4 chronic kidney disease ([place]) 06/29/2015  . Encounter for adequacy testing for hemodialysis ([place]) 04/12/2014  . Chronic kidney disease, stage V ([place]) 04/12/2014  . Focal segmental glomerulosclerosis without nephrosis or chronic  glomerulonephritis 04/06/2014  . Encounter for therapeutic drug monitoring 03/30/2014  . Chronic diastolic heart failure ([place]) 03/15/2014  . Renal failure (ARF), acute on chronic (HCC) 03/13/2014  . Anemia of chronic renal failure, stage 4 (severe) ([place]) 02/23/2014  . Folate deficiency 02/17/2014  . Aortic regurgitation 09/19/2012  . Atrial flutter ([place]) 09/14/2012  . Class 2 severe obesity due to excess calories with serious comorbidity and body mass index (BMI) of 35.0 to 35.9 in adult ([place]) 09/14/2012  . Type 2 diabetes mellitus with diabetic neuropathy ([place]) 09/14/2012  . SLE (systemic lupus erythematosus) ([place]) 09/14/2012  . Essential hypertension, benign 09/14/2012  . COPD (chronic obstructive pulmonary disease) ([place]) 09/14/2012   Teena Irani, PTA/CLT [phone]  Teena Irani 08/04/2017, 5:27 PM  [facility] [address] [place], [place], [address] Phone: [phone]   Fax:  [phone]  Name: KADEY MIHALIC MRN: [id number] Date of Birth: [birth year]

## 2017-08-04 NOTE — Progress Notes (Signed)
Cardiology Office Note    Date:  08/05/2017   ID:  [name], DOB 11-18-1945, MRN [id number]  PCP:  Sharilyn Sites, MD  Cardiologist: Dr. Domenic Polite   Chief Complaint  Patient presents with  . Follow-up    Weight gain on home scales    History of Present Illness:    [name] is a 71 y.o. female with past medical history of chronic diastolic CHF (EF 44-81% by echo in 2015), PAF (on Coumadin for anticoagulation), HTN, HLD, and Stage 4 CKD who presents to the office today for evaluation of weight changes.   She was last examined by Dr. Domenic Polite in 05/2017 and denied any recent chest pain, palpitations, or dyspnea at that time. She was continued on high-dose Lasix 160mg  BID as weight was stable at 210 lbs during her visit. A nurse with [facility] called the office on 08/04/2017 reporting the patient's weight was up to 215 lbs, therefore Cardiology follow-up was recommended.   In talking with the patient and her husband today, she reports having baseline dyspnea on exertion and orthopnea but denies any acute worsening of her symptoms. Has not noticed significant changes in her abdomen or lower extremities. She is being followed by the wound clinic and has not utilized her compression stockings in several weeks due to having dressings in place.   She has experienced fluctuations on her home scales over the past several days, saying her weight is usually between 207 - 210 lbs on her home scales but registered as 215 lbs yesterday. Her husband just replaced the batteries in her scales two weeks ago. Of note, her weight was at 210 lbs on the office scales in 05/2017 and is stable at 210 lbs today. She was also weighed earlier today at a different appointment and weight was 210 lbs at that time.   She limits her fluid intake but still obtains quite a bit of salt in her diet due to canned foods. Does not consume frozen meals regularly.     Past Medical History:  Diagnosis Date  .  Anemia   . Asthma   . Atrial flutter [facility])    TEE/DCCV December 2013 - on Xarelto [facility])  . Bone spur of toe    Right 5th toe  . Chronic diastolic CHF (congestive heart failure) (HCC)    a. EF 50-55% by echo in 2015  . CKD (chronic kidney disease) stage 4, GFR 15-29 ml/min (HCC)    a. s/p fistula placement but not on HD. Followed by Nephrology.   . DVT of upper extremity (deep vein thrombosis) ([place])    Right basilic vein, June 8563  . Essential hypertension, benign   . SLE (systemic lupus erythematosus) ([place])   . Type 2 diabetes mellitus ([place])   . UTI (lower urinary tract infection)     Past Surgical History:  Procedure Laterality Date  . ABDOMINAL HYSTERECTOMY  1983  . ANKLE SURGERY  1993  . BACK SURGERY  1980  . CARDIOVASCULAR STRESS TEST  12/19/2009   no stress induced rhythm abnormalities, ekg negative for ischemia  . COLONOSCOPY  2005   Dr. [name]. Polyps, path unknown   . TRANSESOPHAGEAL ECHOCARDIOGRAM WITH CARDIOVERSION  09/16/2012   EF 60-65%, moderate LVH, moderate regurg of the aortic valve, LA moderately dilated    Current Medications: Outpatient Medications Prior to Visit  Medication Sig Dispense Refill  . acetaminophen (TYLENOL) 500 MG tablet Take 1,000 mg by mouth every 6 (six) hours as needed.  For pain    . albuterol (ACCUNEB) 0.63 MG/3ML nebulizer solution Take 1 ampule by nebulization every 6 (six) hours as needed for wheezing or shortness of breath.     Marland Kitchen albuterol (PROVENTIL HFA;VENTOLIN HFA) 108 (90 BASE) MCG/ACT inhaler Inhale 2 puffs into the lungs every 6 (six) hours as needed. For shortness of breath    . amLODipine (NORVASC) 10 MG tablet Take 1 tablet (10 mg total) by mouth at bedtime. 30 tablet 0  . atorvastatin (LIPITOR) 40 MG tablet Take 20 mg by mouth at bedtime.     . brimonidine-timolol (COMBIGAN) 0.2-0.5 % ophthalmic solution Place 1 drop into the left eye daily.     . calcitRIOL (ROCALTROL) 0.25 MCG capsule Take 0.25 mcg by mouth as directed.  Take on Mondays Wednesdays and Fridays    . Continuous Blood Gluc Sensor (FREESTYLE LIBRE SENSOR SYSTEM) MISC Use one sensor every 10 days. 3 each 2  . diclofenac (VOLTAREN) 0.1 % ophthalmic solution Place 1 drop into the left eye daily. Uses at lunch time    . febuxostat (ULORIC) 40 MG tablet Take 40 mg by mouth daily.    . folic acid (FOLVITE) 1 MG tablet Take 1 mg by mouth daily.    . furosemide (LASIX) 80 MG tablet Take 160 mg by mouth 2 (two) times daily.     Marland Kitchen HUMALOG KWIKPEN 100 UNIT/ML KiwkPen INJECT UP TO 11 UNITS SUBCUTANEOUSLY BEFORE MEALS THREE TIMES DAILY. 15 mL 2  . HYDROcodone-acetaminophen (NORCO/VICODIN) 5-325 MG per tablet Take 1-2 tablets by mouth every 4 (four) hours as needed for moderate pain. 10 tablet 0  . hydroxychloroquine (PLAQUENIL) 200 MG tablet Take 200 mg by mouth as directed. Take 1 1/2 tablets daily    . Insulin Detemir (LEVEMIR FLEXTOUCH [place]) Inject 15 Units into the skin at bedtime.    . metoprolol tartrate (LOPRESSOR) 50 MG tablet TAKE ONE TABLET BY MOUTH TWICE DAILY. 60 tablet 0  . Misc Natural Products (TART CHERRY ADVANCED) CAPS Take 1 capsule by mouth 2 (two) times daily.    . Multiple Vitamins-Minerals (CENTRUM SILVER PO) Take 1 tablet by mouth daily.    . ONE TOUCH ULTRA TEST test strip TEST 4 TIMES A DAY AS DIRECTED. 150 each 2  . ONETOUCH DELICA LANCETS 45X MISC USE TO TEST 4 TIMES DAILY. 100 each 5  . predniSONE (DELTASONE) 5 MG tablet Take 5 mg by mouth daily with breakfast.    . travoprost, benzalkonium, (TRAVATAN) 0.004 % ophthalmic solution Place 1 drop into the left eye at bedtime.    Marland Kitchen UNIFINE PENTIPS 31G X 6 MM MISC USE AS DIRECTED AT BEDTIME WITH LEVEMIR AND WITH SLIDING SCALE INSULIN. 100 each 5  . Vitamin D, Ergocalciferol, (DRISDOL) 50000 units CAPS capsule TAKE 1 CAPSULE BY MOUTH ONCE WEEKLY. 12 capsule 0  . warfarin (COUMADIN) 5 MG tablet Take 1 tablet daily 180 tablet 3  . Darbepoetin Alfa (ARANESP) injection 300 mcg      No  facility-administered medications prior to visit.      Allergies:   Ace inhibitors   Social History   Socioeconomic History  . Marital status: Married    Spouse name: None  . Number of children: None  . Years of education: None  . Highest education level: None  Social Needs  . Financial resource strain: None  . Food insecurity - worry: None  . Food insecurity - inability: None  . Transportation needs - medical: None  . Transportation needs -  non-medical: None  Occupational History  . None  Tobacco Use  . Smoking status: Former Smoker    Packs/day: 1.00    Years: 30.00    Pack years: 30.00    Types: Cigarettes    Last attempt to quit: 08/29/2012    Years since quitting: 4.9  . Smokeless tobacco: Never Used  . Tobacco comment: quit 08-2012  Substance and Sexual Activity  . Alcohol use: No    Alcohol/week: 0.0 oz  . Drug use: No  . Sexual activity: Yes    Partners: Male  Other Topics Concern  . None  Social History Narrative  . None     Family History:  The patient's family history includes Diabetes in her brother, father, and sister; Hypertension in her brother, father, and sister; Stroke in her mother.   Review of Systems:   Please see the history of present illness.     General:  No chills, fever, night sweats or weight changes.  Cardiovascular:  No chest pain, orthopnea, palpitations, paroxysmal nocturnal dyspnea. Positive for dyspnea on exertion and edema.  Dermatological: No rash, lesions/masses Respiratory: No cough, dyspnea Urologic: No hematuria, dysuria Abdominal:   No nausea, vomiting, diarrhea, bright red blood per rectum, melena, or hematemesis Neurologic:  No visual changes, wkns, changes in mental status. All other systems reviewed and are otherwise negative except as noted above.   Physical Exam:    VS:  BP (!) 144/68   Pulse 74   Ht 5\' 5"  (1.651 m)   Wt 210 lb (95.3 kg)   SpO2 93% Comment: on room air  BMI 34.95 kg/m    General: Well  developed, overweight African American female appearing in no acute distress. Head: Normocephalic, atraumatic, sclera non-icteric, no xanthomas, nares are without discharge.  Neck: No carotid bruits. JVD not elevated.  Lungs: Respirations regular and unlabored, without wheezes or rales.  Heart: Regular rate and rhythm. No S3 or S4.  No rubs or gallops appreciated. 2/6 SEM along RUSB.  Abdomen: Soft, non-tender, non-distended with normoactive bowel sounds. No hepatomegaly. No rebound/guarding. No obvious abdominal masses. Msk:  Strength and tone appear normal for age. No joint deformities or effusions. Extremities: No clubbing or cyanosis. Trace lower extremity edema up to knees bilaterally.  Distal pedal pulses are 2+ bilaterally. Neuro: Alert and oriented X 3. Moves all extremities spontaneously. No focal deficits noted. Psych:  Responds to questions appropriately with a normal affect. Skin: No rashes or lesions noted  Wt Readings from Last 3 Encounters:  08/05/17 210 lb (95.3 kg)  08/05/17 209 lb 14.4 oz (95.2 kg)  07/24/17 208 lb (94.3 kg)     Studies/Labs Reviewed:   EKG:  EKG is not ordered today.    Recent Labs: 08/05/2017: ALT 34; BUN 85; Creatinine, Ser 2.48; Hemoglobin 8.4; Platelets 299; Potassium 3.5; Sodium 143   Lipid Panel No results found for: CHOL, TRIG, HDL, CHOLHDL, VLDL, LDLCALC, LDLDIRECT  Additional studies/ records that were reviewed today include:   Echocardiogram: 02/2014 Study Conclusions  - Left ventricle: The cavity size was normal. Wall thickness was increased increased in a pattern of mild to moderate LVH. Systolic function was normal. The estimated ejection fraction was in the range of 50% to 55%. Doppler parameters are consistent with an irreversible restrictive pattern, indicative of decreased left ventricular diastolic compliance and/or increased left atrial pressure (grade 4 diastolic dysfunction). - Aortic valve: Mildly calcified  annulus. Trileaflet; mildly thickened leaflets. There was moderate regurgitation. Regurgitation pressure half-time: 324 ms. -  Aortic root: The aortic root was normal in size. - Mitral valve: Mildly calcified annulus. Moderately thickened leaflets . There was mild regurgitation. - Left atrium: The atrium was moderately dilated. - Pulmonary veins: There is systolic blunting of pulmonary vein flow suggestive of elevated LA pressure. - Right ventricle: The cavity size was mildly to moderately dilated. - Right atrium: The atrium was moderately dilated. - Tricuspid valve: There was moderate-severe regurgitation. The jet is eccentric and posterior, this may lead to underestimation of severity. The TR vena contracta measures 0.4 cm. There is systolic reversal of hepatic vein flow suggestive of severe TR. The morphological etiology of the TR is unclear from available images. - Pulmonary arteries: Systolic pressure was moderately increased. PA peak pressure: 61 mm Hg (S). Pulmonary pressure may be underestimated in the setting of significant TR. - Inferior vena cava: The vessel was dilated. The respirophasic diameter changes were blunted (< 50%), consistent with elevated central venous pressure. - Technically adequate study.  Assessment:    1. Chronic diastolic heart failure (HCC)   2. Paroxysmal atrial flutter (HCC)   3. Current use of long term anticoagulation   4. Essential hypertension   5. Chronic anemia   6. CKD (chronic kidney disease) stage 4, GFR 15-29 ml/min (HCC)      Plan:   In order of problems listed above:  1. Chronic Diastolic CHF - the patient has a preserved EF of 50-55% by echo in 2015. Weights have been followed at home and have been noted to increase from a baseline of 207 - 210 lbs to 215 lbs. Interestingly, her weight has been stable at 210 lbs on our office scales since her last office visit and weight was also at 210 lbs when  checked prior to her scheduled injection earlier today. Recommended the patient contact [place] and see if the scales can be re-calibrated or perhaps a different set can be sent out.  - she does have trace lower extremity edema on examination and lungs are clear, therefore will continue on current high-dose Lasix 160mg  BID. Sodium and fluid restriction reviewed with the patient. Recommended elevation of her legs when sitting along with the utilization of compression stockings.   2. Paroxysmal Atrial Flutter/ Use of Long-Term Anticoagulation - she denies any recent palpitations. Maintaining NSR by examination. Continue Lopressor 50mg  BID for rate-control.  - she denies any evidence of active bleeding. Continue Coumadin for anticoagulation.   3. HTN - BP initially elevated at 164/50 during today's visit. At 144/68 on recheck.  - continue Amlodipine 10mg  daily and Lopressor 50mg  BID. If BP remains elevated, would need to consider the addition of Hydralazine.   4. Chronic Anemia - Hgb stable at 8.4 when checked earlier today which is close to baseline. Patient has been receiving bi-weekly injections but this has become financially difficult for the patient and she has requested to switch to an infusion if this is more reasonable.  - the patient and her husband had many questions about her anemia and the connection of this to her CKD which were addressed during today's visit. I recommended they follow-up with her Nephrologist in regards to switching to infusions (appears this has been addressed with Dr. Talbert Cage earlier today by review of notes).   5. Stage 4 CKD - creatinine stable at 2.48 on most recent check. Followed by Dr. Moshe Cipro.    Medication Adjustments/Labs and Tests Ordered: Current medicines are reviewed at length with the patient today.  Concerns regarding medicines are outlined above.  Medication changes, Labs and Tests ordered today are listed in the Patient Instructions below. Patient  Instructions  Medication Instructions:  Your physician recommends that you continue on your current medications as directed. Please refer to the Current Medication list given to you today.   Labwork: NONE   Testing/Procedures: NONE   Follow-Up: Your physician recommends that you schedule a follow-up appointment with Dr. Domenic Polite.   Any Other Special Instructions Will Be Listed Below (If Applicable).  If you need a refill on your cardiac medications before your next appointment, please call your pharmacy.  Thank you for choosing [facility]!    Signed, Erma Heritage, PA-C  08/05/2017 [facility] [place], [place], [place]  [address] Phone: [phone]; Fax: [phone]  [address] [place], [place] [address] Phone: [phone]

## 2017-08-04 NOTE — Telephone Encounter (Signed)
Patient will come tomorrow at 3:30 pm with PA

## 2017-08-04 NOTE — Telephone Encounter (Signed)
Caryl Pina RN from [facility] states patient has had 13 lbs weight gain over past 24 days.She also reports patient had c/o SOB. Patients weight at home is 215 lbs( they have her weight averaging 205-210 lbs at home ) and she is taking Lasix 160 mg BID

## 2017-08-04 NOTE — Telephone Encounter (Signed)
Not sure what to make of that. Our last weight was 210 pounds in September. It may not actually have been that much of a weight gain. Please schedule her for an APP visit to review diuretic regimen. She has CKD which also complicates matters.

## 2017-08-05 ENCOUNTER — Encounter: Payer: Self-pay | Admitting: Student

## 2017-08-05 ENCOUNTER — Ambulatory Visit (INDEPENDENT_AMBULATORY_CARE_PROVIDER_SITE_OTHER): Payer: Medicare Other | Admitting: Student

## 2017-08-05 ENCOUNTER — Encounter (HOSPITAL_COMMUNITY): Payer: Medicare Other

## 2017-08-05 ENCOUNTER — Encounter (HOSPITAL_COMMUNITY): Payer: Medicare Other | Attending: Oncology

## 2017-08-05 VITALS — BP 144/68 | HR 74 | Ht 65.0 in | Wt 210.0 lb

## 2017-08-05 VITALS — BP 147/75 | HR 67 | Temp 97.9°F | Resp 20 | Wt 209.9 lb

## 2017-08-05 DIAGNOSIS — I10 Essential (primary) hypertension: Secondary | ICD-10-CM | POA: Diagnosis not present

## 2017-08-05 DIAGNOSIS — E782 Mixed hyperlipidemia: Secondary | ICD-10-CM | POA: Diagnosis not present

## 2017-08-05 DIAGNOSIS — M329 Systemic lupus erythematosus, unspecified: Secondary | ICD-10-CM | POA: Diagnosis not present

## 2017-08-05 DIAGNOSIS — I132 Hypertensive heart and chronic kidney disease with heart failure and with stage 5 chronic kidney disease, or end stage renal disease: Secondary | ICD-10-CM | POA: Diagnosis not present

## 2017-08-05 DIAGNOSIS — Z79899 Other long term (current) drug therapy: Secondary | ICD-10-CM | POA: Insufficient documentation

## 2017-08-05 DIAGNOSIS — N058 Unspecified nephritic syndrome with other morphologic changes: Secondary | ICD-10-CM | POA: Insufficient documentation

## 2017-08-05 DIAGNOSIS — E1122 Type 2 diabetes mellitus with diabetic chronic kidney disease: Secondary | ICD-10-CM | POA: Diagnosis not present

## 2017-08-05 DIAGNOSIS — I5032 Chronic diastolic (congestive) heart failure: Secondary | ICD-10-CM | POA: Insufficient documentation

## 2017-08-05 DIAGNOSIS — D649 Anemia, unspecified: Secondary | ICD-10-CM

## 2017-08-05 DIAGNOSIS — D508 Other iron deficiency anemias: Secondary | ICD-10-CM | POA: Insufficient documentation

## 2017-08-05 DIAGNOSIS — I4892 Unspecified atrial flutter: Secondary | ICD-10-CM | POA: Diagnosis not present

## 2017-08-05 DIAGNOSIS — E538 Deficiency of other specified B group vitamins: Secondary | ICD-10-CM | POA: Diagnosis not present

## 2017-08-05 DIAGNOSIS — N184 Chronic kidney disease, stage 4 (severe): Secondary | ICD-10-CM

## 2017-08-05 DIAGNOSIS — D631 Anemia in chronic kidney disease: Secondary | ICD-10-CM | POA: Diagnosis not present

## 2017-08-05 DIAGNOSIS — Z87891 Personal history of nicotine dependence: Secondary | ICD-10-CM | POA: Diagnosis not present

## 2017-08-05 DIAGNOSIS — J449 Chronic obstructive pulmonary disease, unspecified: Secondary | ICD-10-CM | POA: Diagnosis not present

## 2017-08-05 DIAGNOSIS — Z86718 Personal history of other venous thrombosis and embolism: Secondary | ICD-10-CM | POA: Insufficient documentation

## 2017-08-05 DIAGNOSIS — Z5181 Encounter for therapeutic drug level monitoring: Secondary | ICD-10-CM | POA: Insufficient documentation

## 2017-08-05 DIAGNOSIS — Z7901 Long term (current) use of anticoagulants: Secondary | ICD-10-CM

## 2017-08-05 LAB — CBC WITH DIFFERENTIAL/PLATELET
Basophils Absolute: 0 10*3/uL (ref 0.0–0.1)
Basophils Relative: 0 %
EOS ABS: 0.1 10*3/uL (ref 0.0–0.7)
EOS PCT: 1 %
HCT: 28.3 % — ABNORMAL LOW (ref 36.0–46.0)
Hemoglobin: 8.4 g/dL — ABNORMAL LOW (ref 12.0–15.0)
LYMPHS ABS: 0.6 10*3/uL — AB (ref 0.7–4.0)
LYMPHS PCT: 8 %
MCH: 25.5 pg — AB (ref 26.0–34.0)
MCHC: 29.7 g/dL — ABNORMAL LOW (ref 30.0–36.0)
MCV: 86 fL (ref 78.0–100.0)
MONO ABS: 0.6 10*3/uL (ref 0.1–1.0)
Monocytes Relative: 8 %
Neutro Abs: 6 10*3/uL (ref 1.7–7.7)
Neutrophils Relative %: 83 %
PLATELETS: 299 10*3/uL (ref 150–400)
RBC: 3.29 MIL/uL — ABNORMAL LOW (ref 3.87–5.11)
RDW: 18 % — AB (ref 11.5–15.5)
WBC: 7.3 10*3/uL (ref 4.0–10.5)

## 2017-08-05 LAB — IRON AND TIBC
IRON: 29 ug/dL (ref 28–170)
SATURATION RATIOS: 10 % — AB (ref 10.4–31.8)
TIBC: 302 ug/dL (ref 250–450)
UIBC: 273 ug/dL

## 2017-08-05 LAB — COMPREHENSIVE METABOLIC PANEL
ALT: 34 U/L (ref 14–54)
ANION GAP: 11 (ref 5–15)
AST: 30 U/L (ref 15–41)
Albumin: 2.8 g/dL — ABNORMAL LOW (ref 3.5–5.0)
Alkaline Phosphatase: 80 U/L (ref 38–126)
BUN: 85 mg/dL — ABNORMAL HIGH (ref 6–20)
CHLORIDE: 108 mmol/L (ref 101–111)
CO2: 24 mmol/L (ref 22–32)
CREATININE: 2.48 mg/dL — AB (ref 0.44–1.00)
Calcium: 8.2 mg/dL — ABNORMAL LOW (ref 8.9–10.3)
GFR, EST AFRICAN AMERICAN: 21 mL/min — AB (ref >60)
GFR, EST NON AFRICAN AMERICAN: 18 mL/min — AB (ref >60)
Glucose, Bld: 53 mg/dL — ABNORMAL LOW (ref 65–99)
POTASSIUM: 3.5 mmol/L (ref 3.5–5.1)
SODIUM: 143 mmol/L (ref 135–145)
Total Bilirubin: 0.4 mg/dL (ref 0.3–1.2)
Total Protein: 6.7 g/dL (ref 6.5–8.1)

## 2017-08-05 LAB — VITAMIN B12: VITAMIN B 12: 1659 pg/mL — AB (ref 180–914)

## 2017-08-05 LAB — FOLATE

## 2017-08-05 LAB — FERRITIN: Ferritin: 28 ng/mL (ref 11–307)

## 2017-08-05 MED ORDER — DARBEPOETIN ALFA 300 MCG/0.6ML IJ SOSY: 300.0000 ug | PREFILLED_SYRINGE | Freq: Once | INTRAMUSCULAR | Status: DC

## 2017-08-05 MED ORDER — DARBEPOETIN ALFA 300 MCG/0.6ML IJ SOSY
PREFILLED_SYRINGE | INTRAMUSCULAR | Status: AC
  Filled 2017-08-05: qty 0.6

## 2017-08-05 NOTE — Patient Instructions (Signed)
[place] at [facility] Discharge Instructions  RECOMMENDATIONS MADE BY THE CONSULTANT AND ANY TEST RESULTS WILL BE SENT TO YOUR REFERRING PHYSICIAN.  Held aranesp today. Changing to monthly aranesp  Follow up as scheduled.  Thank you for choosing [place] at [facility] to provide your oncology and hematology care.  To afford each patient quality time with our provider, please arrive at least 15 minutes before your scheduled appointment time.    If you have a lab appointment with the [facility] please come in thru the  Main Entrance and check in at the main information desk  You need to re-schedule your appointment should you arrive 10 or more minutes late.  We strive to give you quality time with our providers, and arriving late affects you and other patients whose appointments are after yours.  Also, if you no show three or more times for appointments you may be dismissed from the clinic at the providers discretion.     Again, thank you for choosing [facility].  Our hope is that these requests will decrease the amount of time that you wait before being seen by our physicians.       _____________________________________________________________  Should you have questions after your visit to [facility], please contact our office at [phone] between the hours of 8:30 a.m. and 4:30 p.m.  Voicemails left after 4:30 p.m. will not be returned until the following business day.  For prescription refill requests, have your pharmacy contact our office.       Resources For Cancer Patients and their Caregivers ? American Cancer Society: Can assist with transportation, wigs, general needs, runs Look Good Feel Better.        802-748-7392 ? Cancer Care: Provides financial assistance, online support groups, medication/co-pay assistance.  1-800-813-HOPE 718-828-5863) ? [place] Assists  [place] cancer patients and their families through emotional , educational and financial support.  3077510500 ? [place] DSS Where to apply for food stamps, Medicaid and utility assistance. (276) 035-0676 ? [employer]: Transportation to medical appointments. (989)553-9512 ? Social Security Administration: May apply for disability if have a Stage IV cancer. 260-348-7211 782-245-6078 ? [employer], Disability and Transit Services: Assists with nutrition, care and transit needs. [place] Support Programs: @10RELATIVEDAYS @ > Cancer Support Group  2nd Tuesday of the month 1pm-2pm, Journey Room  > Creative Journey  3rd Tuesday of the month 1130am-1pm, Journey Room  > Look Good Feel Better  1st Wednesday of the month 10am-12 noon, Journey Room (Call [facility] to register [phone])

## 2017-08-05 NOTE — Patient Instructions (Signed)
Medication Instructions:  Your physician recommends that you continue on your current medications as directed. Please refer to the Current Medication list given to you today.   Labwork: NONE   Testing/Procedures: NONE   Follow-Up: Your physician recommends that you schedule a follow-up appointment with Dr. Domenic Polite.    Any Other Special Instructions Will Be Listed Below (If Applicable).     If you need a refill on your cardiac medications before your next appointment, please call your pharmacy.  Thank you for choosing [facility]!

## 2017-08-05 NOTE — Progress Notes (Signed)
Hemoglobin reviewed with MD. Proceed with treatment.  Patient is asking if she could have a blood transfusion instead of shot, patient stated that it is getting too expensive to keep getting shot every week. Discussed this with Dr.Zhou, will change patient to weekly aranesp, will hold injection today per MD. Will redraw labs at her doctor appointment visit and discuss further about what the plan will be for her. Spoke with husband also and explained in great detail what Dr.Zhou had said. Patient and husband in agreement.   Treatment given per orders. Patient tolerated it well without problems. Vitals stable and discharged home from clinic via wheelchair. Follow up as scheduled.

## 2017-08-06 ENCOUNTER — Ambulatory Visit (HOSPITAL_COMMUNITY): Payer: Medicare Other | Admitting: Physical Therapy

## 2017-08-06 ENCOUNTER — Other Ambulatory Visit (HOSPITAL_COMMUNITY): Payer: Self-pay | Admitting: Oncology

## 2017-08-06 DIAGNOSIS — R262 Difficulty in walking, not elsewhere classified: Secondary | ICD-10-CM

## 2017-08-06 DIAGNOSIS — M79605 Pain in left leg: Secondary | ICD-10-CM

## 2017-08-06 DIAGNOSIS — S81802D Unspecified open wound, left lower leg, subsequent encounter: Secondary | ICD-10-CM | POA: Diagnosis not present

## 2017-08-06 DIAGNOSIS — S81801D Unspecified open wound, right lower leg, subsequent encounter: Secondary | ICD-10-CM | POA: Diagnosis not present

## 2017-08-06 NOTE — Therapy (Signed)
[address] [place], [place], [address] Phone: [phone]   Fax:  [phone]  Wound Care Therapy  Patient Details  Name: [name] MRN: [id number] Date of Birth: 1946/04/10 Referring Provider: Sharilyn Sites   Encounter Date: 08/06/2017  PT End of Session - 08/06/17 1336    Visit Number  18    Number of Visits  24    Date for PT Re-Evaluation  08/04/17    Authorization Type  [employer]HC medicare    Authorization - Visit Number  18    Authorization - Number of Visits  20    PT Start Time  8250    PT Stop Time  1105    PT Time Calculation (min)  30 min    Activity Tolerance  Patient tolerated treatment well    Behavior During Therapy  [facility] for tasks assessed/performed       Past Medical History:  Diagnosis Date  . Anemia   . Asthma   . Atrial flutter [facility])    TEE/DCCV December 2013 - on Xarelto [facility])  . Bone spur of toe    Right 5th toe  . Chronic diastolic CHF (congestive heart failure) (HCC)    a. EF 50-55% by echo in 2015  . CKD (chronic kidney disease) stage 4, GFR 15-29 ml/min (HCC)    a. s/p fistula placement but not on HD. Followed by Nephrology.   . DVT of upper extremity (deep vein thrombosis) ([place])    Right basilic vein, June 5397  . Essential hypertension, benign   . SLE (systemic lupus erythematosus) ([place])   . Type 2 diabetes mellitus ([place])   . UTI (lower urinary tract infection)     Past Surgical History:  Procedure Laterality Date  . ABDOMINAL HYSTERECTOMY  1983  . ANKLE SURGERY  1993  . BACK SURGERY  1980  . CARDIOVASCULAR STRESS TEST  12/19/2009   no stress induced rhythm abnormalities, ekg negative for ischemia  . COLONOSCOPY  2005   Dr. [name]. Polyps, path unknown   . TRANSESOPHAGEAL ECHOCARDIOGRAM WITH CARDIOVERSION  09/16/2012   EF 60-65%, moderate LVH, moderate regurg of the aortic valve, LA moderately dilated    There were no vitals filed for this  visit.              Wound Therapy - 08/06/17 1330    Subjective  Pt without complaints.  Still waiting to hear plan from MD    Patient and Family Stated Goals  wound to heal     Prior Treatments  self care, antibiotics     Pain Assessment  No/denies pain    Evaluation and Treatment Procedures Explained to Patient/Family  Yes    Evaluation and Treatment Procedures  agreed to    Wound Properties Date First Assessed: 06/25/17 Time First Assessed: 1100 Wound Type: Non-pressure wound Location: Leg Location Orientation: Posterior;Medial;Right Wound Description (Comments): blister   Dressing Type  Silver hydrofiber;Gauze (Comment);Tape dressing silver hydrofiber, 2x2, medipore tape   silver hydrofiber, 2x2, medipore tape   Dressing Changed  Changed    Dressing Status  Old drainage    Dressing Change Frequency  PRN    Site / Wound Assessment  Red    % Wound base Red or Granulating  15%    % Wound base Yellow/Fibrinous Exudate  85%    % Wound base Black/Eschar  0%    Peri-wound Assessment  Intact;Erythema (blanchable)    Wound Length (cm)  1  cm    Wound Width (cm)  1.5 cm    Wound Depth (cm)  0.2 cm    Wound Volume (cm^3)  0.3 cm^3    Wound Surface Area (cm^2)  1.5 cm^2    Margins  Unattached edges (unapproximated)    Drainage Amount  Minimal    Drainage Description  Sanguineous    Treatment  Cleansed;Debridement (Selective)    Wound Properties Date First Assessed: 06/04/17 Time First Assessed: 4818 Wound Type: Non-pressure wound Location: Leg Location Orientation: Left;Lateral;Lower Present on Admission: Yes   Dressing Type  Silver hydrofiber;Gauze (Comment);Tape dressing silver hydrofiber, 4x4, medipore tape   silver hydrofiber, 4x4, medipore tape   Dressing Changed  Changed    Dressing Status  Old drainage    Dressing Change Frequency  PRN    Site / Wound Assessment  Yellow;Red;Pink;Granulation tissue granulation tissue on inferior border of wound   granulation tissue on  inferior border of wound   % Wound base Red or Granulating  80%    % Wound base Yellow/Fibrinous Exudate  20%    % Wound base Black/Eschar  0%    Peri-wound Assessment  Pink    Wound Length (cm)  2.8 cm    Wound Width (cm)  2.8 cm    Wound Depth (cm)  0.1 cm    Wound Volume (cm^3)  0.78 cm^3    Wound Surface Area (cm^2)  7.84 cm^2    Margins  Attached edges (approximated)    Drainage Amount  Scant    Drainage Description  Serous    Treatment  Cleansed;Debridement (Selective)    Selective Debridement - Location  Both wounds    Selective Debridement - Tools Used  Forceps;Scalpel    Selective Debridement - Tissue Removed  slough and biofilm     Wound Therapy - Clinical Statement  Rt wound continues to increase slightly with nonapproximating edges.  Continued with sharps debridment to tolerance to promote approximation.   Pt with large fluid filled blister Lt anterior LE approx 2x1.4 cm.  did not debride or dress this area.  Lt LE wound continues to approximate and decrease in size.  Continued with same dressings of silver hydrofiber.    Wound Therapy - Functional Problem List  difficulty in walking     Factors Delaying/Impairing Wound Healing  Altered sensation;Diabetes Mellitus;Infection - systemic/local;Multiple medical problems;Polypharmacy;Vascular compromise    Hydrotherapy Plan  Debridement;Dressing change;Patient/family education    Wound Therapy - Frequency  -- 2x/week for 6 weeks   2x/week for 6 weeks   Wound Therapy - Current Recommendations  PT    Wound Plan  Continue appropriate wound care.  ABI sceduled in December.  Follow up with edema and dressings.    Dressing   B wounds silver hydrofiber, 2X2, medipore #6 netting                PT Short Term Goals - 07/09/17 1337      PT SHORT TERM GOAL #1   Title  Pt to understand signs and symptoms of cellulits and the importance of contacting MD if this occurs     Time  1    Period  Weeks    Status  Achieved      PT  SHORT TERM GOAL #2   Title  PT lateral wound to be 75% granulated and to have no black eschar to reduce the risk of infection.    Baseline  -- currently no black eschar; 60% granulated  Time  2    Period  Weeks    Status  On-going      PT SHORT TERM GOAL #3   Title  Pt pain to be decreased to no greater than a 4/10 to allow pt to rest better    Baseline  -- 07/09/2017:  pain is no greater than a 4/10 except when debriding.  Pt unable to walk 10 minutes due to CHF revised goal to be so that pt can rest better.     Time  3    Period  Weeks    Status  Revised        PT Long Term Goals - 07/09/17 1340      PT LONG TERM GOAL #1   Title  Pt wound to be 100% granulated to prevent infection     Time  4    Period  Weeks    Status  On-going      PT LONG TERM GOAL #2   Title  PT lateral  wound size to be no greater than 2x2 to allow pt to feel comfortable in self care of wound ; anterior wound to be healed     Time  6    Period  Weeks    Status  On-going      PT LONG TERM GOAL #3   Title  If profore felt comfortable to pt pt to be educated in compression garments and why they may be benefical for pt.  Pt has had wounds come up on her Rt leg in the past as well     Baseline  -- 07/09/2017:  Await result of ABI    Time  6    Period  Weeks    Status  Deferred              Patient will benefit from skilled therapeutic intervention in order to improve the following deficits and impairments:     Visit Diagnosis: Pain in left leg  Leg wound, left, subsequent encounter  Leg wound, right, subsequent encounter  Difficulty in walking, not elsewhere classified     Problem List Patient Active Problem List   Diagnosis Date Noted  . Heme positive stool 10/04/2015  . Mixed hyperlipidemia 06/29/2015  . Type 2 diabetes mellitus with stage 4 chronic kidney disease ([place]) 06/29/2015  . Encounter for adequacy testing for hemodialysis ([place]) 04/12/2014  . Chronic kidney disease,  stage V ([place]) 04/12/2014  . 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 Encounter for therapeutic drug monitoring 03/30/2014  . Chronic diastolic heart failure ([place]) 03/15/2014  . Renal failure (ARF), acute on chronic (HCC) 03/13/2014  . Anemia of chronic renal failure, stage 4 (severe) ([place]) 02/23/2014  . Folate deficiency 02/17/2014  . Aortic regurgitation 09/19/2012  . Atrial flutter ([place]) 09/14/2012  . Class 2 severe obesity due to excess calories with serious comorbidity and body mass index (BMI) of 35.0 to 35.9 in adult ([place]) 09/14/2012  . Type 2 diabetes mellitus with diabetic neuropathy ([place]) 09/14/2012  . SLE (systemic lupus erythematosus) ([place]) 09/14/2012  . Essential hypertension, benign 09/14/2012  . COPD (chronic obstructive pulmonary disease) ([place]) 09/14/2012   Teena Irani, PTA/CLT [phone]  Teena Irani 08/06/2017, 1:36 PM  [facility] [address] [place], [place], [address] Phone: [phone]   Fax:  [phone]  Name: [name] MRN: [id number] Date of Birth: 11/19/1945

## 2017-08-07 ENCOUNTER — Other Ambulatory Visit (HOSPITAL_COMMUNITY): Payer: Self-pay | Admitting: Adult Health

## 2017-08-07 DIAGNOSIS — D509 Iron deficiency anemia, unspecified: Secondary | ICD-10-CM

## 2017-08-10 ENCOUNTER — Other Ambulatory Visit (HOSPITAL_COMMUNITY): Payer: Self-pay | Admitting: Family Medicine

## 2017-08-10 ENCOUNTER — Ambulatory Visit (HOSPITAL_COMMUNITY)
Admission: RE | Admit: 2017-08-10 | Discharge: 2017-08-10 | Disposition: A | Discharge status: Home / Self Care | Payer: Medicare Other | Source: Ambulatory Visit | Attending: Family Medicine | Admitting: Family Medicine

## 2017-08-10 DIAGNOSIS — M1611 Unilateral primary osteoarthritis, right hip: Secondary | ICD-10-CM | POA: Insufficient documentation

## 2017-08-10 DIAGNOSIS — M25551 Pain in right hip: Secondary | ICD-10-CM

## 2017-08-11 ENCOUNTER — Emergency Department (HOSPITAL_COMMUNITY)
Admission: EM | Admit: 2017-08-11 | Discharge: 2017-08-11 | Disposition: A | Discharge status: Home / Self Care | Payer: Medicare Other | Attending: Emergency Medicine | Admitting: Emergency Medicine

## 2017-08-11 ENCOUNTER — Encounter (HOSPITAL_COMMUNITY): Payer: Self-pay | Admitting: Physical Therapy

## 2017-08-11 ENCOUNTER — Other Ambulatory Visit: Payer: Self-pay

## 2017-08-11 ENCOUNTER — Ambulatory Visit (HOSPITAL_COMMUNITY): Payer: Medicare Other | Admitting: Physical Therapy

## 2017-08-11 ENCOUNTER — Encounter (HOSPITAL_COMMUNITY): Payer: Self-pay | Admitting: Emergency Medicine

## 2017-08-11 DIAGNOSIS — I130 Hypertensive heart and chronic kidney disease with heart failure and stage 1 through stage 4 chronic kidney disease, or unspecified chronic kidney disease: Secondary | ICD-10-CM | POA: Insufficient documentation

## 2017-08-11 DIAGNOSIS — Z7901 Long term (current) use of anticoagulants: Secondary | ICD-10-CM | POA: Diagnosis not present

## 2017-08-11 DIAGNOSIS — I5032 Chronic diastolic (congestive) heart failure: Secondary | ICD-10-CM | POA: Insufficient documentation

## 2017-08-11 DIAGNOSIS — S81801D Unspecified open wound, right lower leg, subsequent encounter: Secondary | ICD-10-CM | POA: Diagnosis not present

## 2017-08-11 DIAGNOSIS — Z79899 Other long term (current) drug therapy: Secondary | ICD-10-CM | POA: Insufficient documentation

## 2017-08-11 DIAGNOSIS — M79605 Pain in left leg: Secondary | ICD-10-CM

## 2017-08-11 DIAGNOSIS — J449 Chronic obstructive pulmonary disease, unspecified: Secondary | ICD-10-CM | POA: Insufficient documentation

## 2017-08-11 DIAGNOSIS — E1122 Type 2 diabetes mellitus with diabetic chronic kidney disease: Secondary | ICD-10-CM | POA: Insufficient documentation

## 2017-08-11 DIAGNOSIS — Z794 Long term (current) use of insulin: Secondary | ICD-10-CM | POA: Diagnosis not present

## 2017-08-11 DIAGNOSIS — N184 Chronic kidney disease, stage 4 (severe): Secondary | ICD-10-CM | POA: Diagnosis not present

## 2017-08-11 DIAGNOSIS — J45909 Unspecified asthma, uncomplicated: Secondary | ICD-10-CM | POA: Insufficient documentation

## 2017-08-11 DIAGNOSIS — E1140 Type 2 diabetes mellitus with diabetic neuropathy, unspecified: Secondary | ICD-10-CM | POA: Insufficient documentation

## 2017-08-11 DIAGNOSIS — Z87891 Personal history of nicotine dependence: Secondary | ICD-10-CM | POA: Diagnosis not present

## 2017-08-11 DIAGNOSIS — S81802D Unspecified open wound, left lower leg, subsequent encounter: Secondary | ICD-10-CM | POA: Diagnosis not present

## 2017-08-11 DIAGNOSIS — R262 Difficulty in walking, not elsewhere classified: Secondary | ICD-10-CM

## 2017-08-11 DIAGNOSIS — L03115 Cellulitis of right lower limb: Secondary | ICD-10-CM | POA: Diagnosis not present

## 2017-08-11 DIAGNOSIS — M25551 Pain in right hip: Secondary | ICD-10-CM | POA: Diagnosis present

## 2017-08-11 MED ORDER — ONDANSETRON 4 MG PO TBDP
4.0000 mg | ORAL_TABLET | Freq: Once | ORAL | Status: AC
  Administered 2017-08-11: 4 mg via ORAL
  Filled 2017-08-11: qty 1

## 2017-08-11 MED ORDER — DOXYCYCLINE HYCLATE 100 MG PO CAPS: 100.0000 mg | ORAL_CAPSULE | Freq: Two times a day (BID) | ORAL | 0 refills | Status: DC

## 2017-08-11 MED ORDER — NYSTATIN 100000 UNIT/GM EX POWD: Freq: Four times a day (QID) | CUTANEOUS | 1 refills | Status: DC

## 2017-08-11 NOTE — ED Provider Notes (Signed)
[facility] EMERGENCY DEPARTMENT Provider Note   CSN: [id number] Arrival date & time: 08/11/17  0801     History   Chief Complaint Chief Complaint  Patient presents with  . Hip Pain    HPI [name] is a 71 y.o. female.  Right lateral hip pain for 2 weeks.  She was seen by her primary care doctor and an x-ray of her right hip was ordered.  No trauma, fever, sweats, chills.  She is ambulatory.  Severity of pain is moderate.  Palpation makes pain worse.      Past Medical History:  Diagnosis Date  . Anemia   . Asthma   . Atrial flutter [facility])    TEE/DCCV December 2013 - on Xarelto [facility])  . Bone spur of toe    Right 5th toe  . Chronic diastolic CHF (congestive heart failure) (HCC)    a. EF 50-55% by echo in 2015  . CKD (chronic kidney disease) stage 4, GFR 15-29 ml/min (HCC)    a. s/p fistula placement but not on HD. Followed by Nephrology.   . DVT of upper extremity (deep vein thrombosis) ([place])    Right basilic vein, June 6440  . Essential hypertension, benign   . SLE (systemic lupus erythematosus) ([place])   . Type 2 diabetes mellitus ([place])   . UTI (lower urinary tract infection)     Patient Active Problem List   Diagnosis Date Noted  . Iron deficiency anemia 08/07/2017  . Heme positive stool 10/04/2015  . Mixed hyperlipidemia 06/29/2015  . Type 2 diabetes mellitus with stage 4 chronic kidney disease ([place]) 06/29/2015  . Encounter for adequacy testing for hemodialysis ([place]) 04/12/2014  . Chronic kidney disease, stage V ([place]) 04/12/2014  . 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 Encounter for therapeutic drug monitoring 03/30/2014  . Chronic diastolic heart failure ([place]) 03/15/2014  . Renal failure (ARF), acute on chronic (HCC) 03/13/2014  . Anemia of chronic renal failure, stage 4 (severe) ([place]) 02/23/2014  . Folate deficiency 02/17/2014  . Aortic regurgitation 09/19/2012  . Atrial flutter ([place])  09/14/2012  . Class 2 severe obesity due to excess calories with serious comorbidity and body mass index (BMI) of 35.0 to 35.9 in adult ([place]) 09/14/2012  . Type 2 diabetes mellitus with diabetic neuropathy ([place]) 09/14/2012  . SLE (systemic lupus erythematosus) ([place]) 09/14/2012  . Essential hypertension, benign 09/14/2012  . COPD (chronic obstructive pulmonary disease) ([place]) 09/14/2012    Past Surgical History:  Procedure Laterality Date  . ABDOMINAL HYSTERECTOMY  1983  . ANKLE SURGERY  1993  . BACK SURGERY  1980  . CARDIOVASCULAR STRESS TEST  12/19/2009   no stress induced rhythm abnormalities, ekg negative for ischemia  . COLONOSCOPY  2005   Dr. [name]. Polyps, path unknown   . TRANSESOPHAGEAL ECHOCARDIOGRAM WITH CARDIOVERSION  09/16/2012   EF 60-65%, moderate LVH, moderate regurg of the aortic valve, LA moderately dilated    OB History    No data available       Home Medications    Prior to Admission medications   Medication Sig Start Date End Date Taking? Authorizing Provider  acetaminophen (TYLENOL) 500 MG tablet Take 1,000 mg by mouth every 6 (six) hours as needed. For pain   Yes [provider]  albuterol (ACCUNEB) 0.63 MG/3ML nebulizer solution Take 1 ampule by nebulization every 6 (six) hours as needed for wheezing or shortness of breath.    Yes [provider]  albuterol (  PROVENTIL HFA;VENTOLIN HFA) 108 (90 BASE) MCG/ACT inhaler Inhale 2 puffs into the lungs every 6 (six) hours as needed. For shortness of breath   Yes [provider]  amLODipine (NORVASC) 10 MG tablet Take 1 tablet (10 mg total) by mouth at bedtime. 03/28/14  Yes [name], MD  atorvastatin (LIPITOR) 40 MG tablet Take 20 mg by mouth at bedtime.    Yes [provider]  brimonidine-timolol (COMBIGAN) 0.2-0.5 % ophthalmic solution Place 1 drop into the left eye daily.    Yes [provider]  calcitRIOL (ROCALTROL) 0.25 MCG capsule Take 0.25 mcg by mouth  as directed. Take on Mondays Wednesdays and Fridays   Yes [provider]  Continuous Blood Gluc Sensor ([place]) MISC Use one sensor every 10 days. 07/24/17  Yes Cassandria Anger, MD  diclofenac (VOLTAREN) 0.1 % ophthalmic solution Place 1 drop into the left eye daily. Uses at lunch time   Yes [provider]  febuxostat (ULORIC) 40 MG tablet Take 40 mg by mouth daily.   Yes [provider]  folic acid (FOLVITE) 1 MG tablet Take 1 mg by mouth daily.   Yes [provider]  furosemide (LASIX) 80 MG tablet Take 160 mg by mouth 2 (two) times daily.    Yes [provider]  HUMALOG KWIKPEN 100 UNIT/ML KiwkPen INJECT UP TO 11 UNITS SUBCUTANEOUSLY BEFORE MEALS THREE TIMES DAILY. 07/24/17  Yes [name], [name], MD  HYDROcodone-acetaminophen (NORCO/VICODIN) 5-325 MG per tablet Take 1-2 tablets by mouth every 4 (four) hours as needed for moderate pain. 03/28/14  Yes [name], MD  hydroxychloroquine (PLAQUENIL) 200 MG tablet Take 200 mg by mouth as directed. Take 1 1/2 tablets daily   Yes [provider]  Insulin Detemir (LEVEMIR FLEXTOUCH [place]) Inject 15 Units into the skin at bedtime.   Yes [provider]  metoprolol tartrate (LOPRESSOR) 50 MG tablet TAKE ONE TABLET BY MOUTH TWICE DAILY. 07/23/17  Yes Satira Sark, MD  Misc Natural Products [facility] ADVANCED) CAPS Take 1 capsule by mouth 2 (two) times daily.   Yes [provider]  Multiple Vitamins-Minerals (CENTRUM SILVER PO) Take 1 tablet by mouth daily.   Yes [provider]  ONE TOUCH ULTRA TEST test strip TEST 4 TIMES A DAY AS DIRECTED. 06/09/17  Yes [name], [name], MD  ONETOUCH DELICA LANCETS 81E MISC USE TO TEST 4 TIMES DAILY. 09/05/15  Yes [name], [name], MD  predniSONE (DELTASONE) 5 MG tablet Take 5 mg by mouth daily with breakfast.   Yes [provider]  travoprost, benzalkonium, (TRAVATAN) 0.004 %  ophthalmic solution Place 1 drop into the left eye at bedtime.   Yes [provider]  UNIFINE PENTIPS 31G X 6 MM MISC USE AS DIRECTED AT BEDTIME WITH LEVEMIR AND WITH SLIDING SCALE INSULIN. 03/30/17  Yes [name], [name], MD  Vitamin D, Ergocalciferol, (DRISDOL) 50000 units CAPS capsule TAKE 1 CAPSULE BY MOUTH ONCE WEEKLY. 06/17/17  Yes Cassandria Anger, MD  warfarin (COUMADIN) 5 MG tablet Take 1 tablet daily 01/26/17  Yes Satira Sark, MD  doxycycline (VIBRAMYCIN) 100 MG capsule Take 1 capsule (100 mg total) 2 (two) times daily by mouth. 08/11/17   Nat Christen, MD  nystatin (MYCOSTATIN/NYSTOP) powder Apply 4 (four) times daily topically. 08/11/17   Nat Christen, MD    Family History Family History  Problem Relation Age of Onset  . Diabetes Brother   . Diabetes Father   .  Hypertension Father   . Hypertension Sister   . Stroke Mother   . Diabetes Sister   . Hypertension Brother   . Colon cancer Neg Hx     Social History Social History   Tobacco Use  . Smoking status: Former Smoker    Packs/day: 1.00    Years: 30.00    Pack years: 30.00    Types: Cigarettes    Last attempt to quit: 08/29/2012    Years since quitting: 4.9  . Smokeless tobacco: Never Used  . Tobacco comment: quit 08-2012  Substance Use Topics  . Alcohol use: No    Alcohol/week: 0.0 oz  . Drug use: No     Allergies   Ace inhibitors   Review of Systems Review of Systems  All other systems reviewed and are negative.    Physical Exam Updated Vital Signs BP (!) 143/85   Pulse 63   Temp 97.8 F (36.6 C) (Oral)   Resp 20   Ht 5\' 5"  (1.651 m)   Wt 95.3 kg (210 lb)   SpO2 97%   BMI 34.95 kg/m   Physical Exam  Constitutional: She is oriented to person, place, and time. She appears well-developed and well-nourished.  HENT:  Head: Normocephalic and atraumatic.  Eyes: Conjunctivae are normal.  Neck: Neck supple.  Cardiovascular: Normal rate and regular rhythm.  Pulmonary/Chest:  Effort normal and breath sounds normal.  Abdominal: Soft. Bowel sounds are normal.  Musculoskeletal: Normal range of motion.  Right hip: No obvious bony tenderness of the hip.  Neurological: She is alert and oriented to person, place, and time.  Skin:  Right femur: Area of erythema approximately 4 cm in diameter with tenderness, but no induration.    Abdomen: Malodorous erythematous nonindurated rash in the skin folds of her pannus  Psychiatric: She has a normal mood and affect. Her behavior is normal.  Nursing note and vitals reviewed.    ED Treatments / Results  Labs (all labs ordered are listed, but only abnormal results are displayed) Labs Reviewed - No data to display  EKG  EKG Interpretation None       Radiology Dg Hip Unilat With Pelvis 2-3 Views Right  Result Date: 08/10/2017 CLINICAL DATA:  Right hip pain with a palpable knot over the lateral aspect of the right mid femur for the past 2-3 weeks. No known injury. EXAM: DG HIP (WITH OR WITHOUT PELVIS) 2-3V RIGHT COMPARISON:  None in PACs FINDINGS: The bony pelvis is subjectively adequately mineralized. There is no lytic nor blastic lesion. There are degenerative changes of the lower lumbar spine. AP and lateral views of the right hip reveal mild symmetric narrowing of the joint space. The articular surfaces of the femoral head and acetabulum remains smoothly rounded. The femoral neck, intertrochanteric, and subtrochanteric regions exhibit no acute abnormalities. The overlying soft tissues are normal. There are vascular calcifications present. IMPRESSION: There is no acute bony abnormality of the right hip. There is mild to moderate osteoarthritic change of the right hip joint. Electronically Signed   By: David  Martinique M.D.   On: 08/10/2017 10:04    Procedures Procedures (including critical care time)  Medications Ordered in ED Medications  ondansetron (ZOFRAN-ODT) disintegrating tablet 4 mg (4 mg Oral Given 08/11/17 0947)       Initial Impression / Assessment and Plan / ED Course  I have reviewed the triage vital signs and the nursing notes.  Pertinent labs & imaging results that were available during my care of  the patient were reviewed by me and considered in my medical decision making (see chart for details).    Patient presents with a concern of right lateral hip pain.  Plain films were reviewed and they were negative.  It does appear to be a cellulitic rash on her lateral proximal femur.  Additionally, she has tinea in her skin folds of her abdominal pannus.  Will Rx doxycycline 100 mg and nystatin powder. This was discussed with the patient and her husband.  She has follow-up in 1 week for her INR   Final Clinical Impressions(s) / ED Diagnoses   Final diagnoses:  Cellulitis of right lower extremity    ED Discharge Orders        Ordered    doxycycline (VIBRAMYCIN) 100 MG capsule  2 times daily     08/11/17 0958    nystatin (MYCOSTATIN/NYSTOP) powder  4 times daily     08/11/17 0958       Nat Christen, MD 08/11/17 1004

## 2017-08-11 NOTE — Discharge Instructions (Signed)
You have a skin infection called cellulitis on your right hip.  Prescription antibiotic for same.  Additionally, I am prescribing a powder for the redness in the skin folds of your abdomen.

## 2017-08-11 NOTE — Therapy (Addendum)
[place], [place], [address] Phone: [phone]   Fax:  [phone]  Wound Care Therapy  Patient Details  Name: [name] MRN: [id number] Date of Birth: 1946-02-06 Referring Provider: Sharilyn Sites   Encounter Date: 08/11/2017  PT End of Session - 08/11/17 1616    Visit Number  19    Number of Visits  24    Date for PT Re-Evaluation  08/04/17    Authorization Type  [employer] medicare    Authorization - Visit Number  19    Authorization - Number of Visits  20    PT Start Time  6629    PT Stop Time  1545    PT Time Calculation (min)  30 min    Activity Tolerance  Patient tolerated treatment well    Behavior During Therapy  [facility] for tasks assessed/performed       Past Medical History:  Diagnosis Date  . Anemia   . Asthma   . Atrial flutter [facility])    TEE/DCCV December 2013 - on Xarelto [facility])  . Bone spur of toe    Right 5th toe  . Chronic diastolic CHF (congestive heart failure) (HCC)    a. EF 50-55% by echo in 2015  . CKD (chronic kidney disease) stage 4, GFR 15-29 ml/min (HCC)    a. s/p fistula placement but not on HD. Followed by Nephrology.   . DVT of upper extremity (deep vein thrombosis) ([place])    Right basilic vein, June 4765  . Essential hypertension, benign   . SLE (systemic lupus erythematosus) ([place])   . Type 2 diabetes mellitus ([place])   . UTI (lower urinary tract infection)     Past Surgical History:  Procedure Laterality Date  . ABDOMINAL HYSTERECTOMY  1983  . ANKLE SURGERY  1993  . BACK SURGERY  1980  . CARDIOVASCULAR STRESS TEST  12/19/2009   no stress induced rhythm abnormalities, ekg negative for ischemia  . COLONOSCOPY  2005   Dr. [name]. Polyps, path unknown   . TRANSESOPHAGEAL ECHOCARDIOGRAM WITH CARDIOVERSION  09/16/2012   EF 60-65%, moderate LVH, moderate regurg of the aortic valve, LA moderately dilated    There were no vitals filed for this  visit.              Wound Therapy - 08/11/17 1556    Subjective  PT has significant pain with debridment no pain otherwise.  Pt still has not heard from MD about further testing due to her ABI results     Patient and Family Stated Goals  wounds to heal     Prior Treatments  self care, antibiotics     Pain Assessment  0-10    Pain Score  8  with debridement    Pain Location  Leg    Pain Orientation  Right;Posterior    Pain Descriptors / Indicators  Throbbing    Pain Onset  On-going    Multiple Pain Sites  No    Evaluation and Treatment Procedures Explained to Patient/Family  Yes    Evaluation and Treatment Procedures  agreed to    Wound Properties Date First Assessed: 06/25/17 Time First Assessed: 1100 Wound Type: Non-pressure wound Location: Leg Location Orientation: Posterior;Medial;Right Wound Description (Comments): blister   Dressing Type  Silver hydrofiber    Dressing Changed  Changed    Dressing Status  Old drainage    Dressing Change Frequency  PRN    Site / Wound  Assessment  Yellow    % Wound base Red or Granulating  5% was 15%    % Wound base Yellow/Fibrinous Exudate  95% was 85%    Peri-wound Assessment  Intact    Wound Length (cm)  1.2 cm was 1    Wound Width (cm)  1.4 cm was 1.5    Wound Depth (cm)  0.3 cm was .2    Wound Volume (cm^3)  0.5 cm^3    Wound Surface Area (cm^2)  1.68 cm^2    Drainage Amount  Minimal    Drainage Description  Serous;No odor    Treatment  Cleansed;Debridement (Selective) attempted debridement but difficult for pt to tolerate    Wound Properties Date First Assessed: 06/04/17 Time First Assessed: 2025 Wound Type: Non-pressure wound Location: Leg Location Orientation: Left;Lateral;Lower Present on Admission: Yes   Dressing Type  Gauze (Comment);Hydrogel;Silver hydrofiber    Dressing Changed  Changed    Dressing Change Frequency  PRN    Site / Wound Assessment  Pink;Yellow    % Wound base Red or Granulating  85%    % Wound base  Yellow/Fibrinous Exudate  15%    Peri-wound Assessment  Pink    Wound Length (cm)  2.5 cm was 2.8    Wound Width (cm)  1.2 cm was 2.8    Wound Depth (cm)  0.1 cm    Wound Volume (cm^3)  0.3 cm^3    Wound Surface Area (cm^2)  3 cm^2    Drainage Amount  Scant    Treatment  Cleansed;Debridement (Selective)    Selective Debridement - Location  both wounds     Selective Debridement - Tools Used  Forceps    Selective Debridement - Tissue Removed  slough    Wound Therapy - Clinical Statement  Rt wound is not progressing, this wound ABI was .8; Lt wound continues to slowly improve.  MD office will be contacted re no improvement of RT LE and what he would like to do next ; re possible vascular consult for a stent.      Wound Therapy - Functional Problem List  difficulty in walking     Factors Delaying/Impairing Wound Healing  Altered sensation;Multiple medical problems;Vascular compromise    Hydrotherapy Plan  Debridement;Dressing change;Patient/family education    Wound Therapy - Frequency  -- 2x a week     Wound Therapy - Current Recommendations  PT    Wound Plan  Contact MD re next step due to ABI    Dressing   Rt LE silverhydrofiber 2x2 and medipore tape    Dressing  Lt LE pink area hydrogel; slough silverhydrofiber with 2x2 and medipore tape                 PT Short Term Goals - 07/09/17 1337      PT SHORT TERM GOAL #1   Title  Pt to understand signs and symptoms of cellulits and the importance of contacting MD if this occurs     Time  1    Period  Weeks    Status  Achieved      PT SHORT TERM GOAL #2   Title  PT lateral wound to be 75% granulated and to have no black eschar to reduce the risk of infection.    Baseline  -- currently no black eschar; 60% granulated     Time  2    Period  Weeks    Status  On-going      PT  SHORT TERM GOAL #3   Title  Pt pain to be decreased to no greater than a 4/10 to allow pt to rest better    Baseline  -- 07/09/2017:  pain is no greater  than a 4/10 except when debriding.  Pt unable to walk 10 minutes due to CHF revised goal to be so that pt can rest better.     Time  3    Period  Weeks    Status  Revised        PT Long Term Goals - 07/09/17 1340      PT LONG TERM GOAL #1   Title  Pt wound to be 100% granulated to prevent infection     Time  4    Period  Weeks    Status  On-going      PT LONG TERM GOAL #2   Title  PT lateral  wound size to be no greater than 2x2 to allow pt to feel comfortable in self care of wound ; anterior wound to be healed     Time  6    Period  Weeks    Status  On-going      PT LONG TERM GOAL #3   Title  If profore felt comfortable to pt pt to be educated in compression garments and why they may be benefical for pt.  Pt has had wounds come up on her Rt leg in the past as well     Baseline  -- 07/09/2017:  Await result of ABI    Time  6    Period  Weeks    Status  Deferred        G code OTher:  Clinical judgement wound size and granulation.  Initial wound is doing much better but new wound On Rt LE.  G8990  CK G8991 CI     Plan - 08/11/17 1616    Clinical Impression Statement  see above    Rehab Potential  Good    PT Frequency  2x / week    PT Duration  12 weeks    PT Treatment/Interventions  Other (comment) debridement and dressing change     PT Next Visit Plan  continue to remeasure wounds on Tuesdays. MD office called  re arterial occlusion of the RT LE without progression; no answer but therapist left a message.         Patient will benefit from skilled therapeutic intervention in order to improve the following deficits and impairments:  Decreased activity tolerance, Increased edema, Difficulty walking, Pain  Visit Diagnosis: Pain in left leg  Leg wound, left, subsequent encounter  Leg wound, right, subsequent encounter  Difficulty in walking, not elsewhere classified     Problem List Patient Active Problem List   Diagnosis Date Noted  . Iron deficiency  anemia 08/07/2017  . Heme positive stool 10/04/2015  . Mixed hyperlipidemia 06/29/2015  . Type 2 diabetes mellitus with stage 4 chronic kidney disease ([place]) 06/29/2015  . Encounter for adequacy testing for hemodialysis ([place]) 04/12/2014  . Chronic kidney disease, stage V ([place]) 04/12/2014  . 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 Encounter for therapeutic drug monitoring 03/30/2014  . Chronic diastolic heart failure ([place]) 03/15/2014  . Renal failure (ARF), acute on chronic (HCC) 03/13/2014  . Anemia of chronic renal failure, stage 4 (severe) ([place]) 02/23/2014  . Folate deficiency 02/17/2014  . Aortic regurgitation 09/19/2012  . Atrial flutter ([place]) 09/14/2012  . Class 2  severe obesity due to excess calories with serious comorbidity and body mass index (BMI) of 35.0 to 35.9 in adult [facility]) 09/14/2012  . Type 2 diabetes mellitus with diabetic neuropathy ([place]) 09/14/2012  . SLE (systemic lupus erythematosus) ([place]) 09/14/2012  . Essential hypertension, benign 09/14/2012  . COPD (chronic obstructive pulmonary disease) ([place]) 09/14/2012    Rayetta Humphrey, PT CLT [phone] 08/11/2017, 4:20 PM  [facility] [address] [place], [place], [address] Phone: [phone]   Fax:  [phone]  Name: [name] MRN: [id number] Date of Birth: 18-Jun-1946

## 2017-08-11 NOTE — ED Triage Notes (Signed)
Pt c/o of right hip pain starting two week ago. Had xrays here yesterday. No new injury

## 2017-08-12 ENCOUNTER — Other Ambulatory Visit (HOSPITAL_COMMUNITY): Payer: Medicare Other

## 2017-08-12 ENCOUNTER — Encounter (HOSPITAL_BASED_OUTPATIENT_CLINIC_OR_DEPARTMENT_OTHER): Payer: Medicare Other

## 2017-08-12 ENCOUNTER — Ambulatory Visit (HOSPITAL_COMMUNITY): Payer: Medicare Other

## 2017-08-12 ENCOUNTER — Encounter (HOSPITAL_COMMUNITY): Payer: Self-pay

## 2017-08-12 VITALS — BP 159/31 | HR 69 | Temp 98.1°F | Resp 18 | Wt 204.8 lb

## 2017-08-12 DIAGNOSIS — N184 Chronic kidney disease, stage 4 (severe): Principal | ICD-10-CM

## 2017-08-12 DIAGNOSIS — D509 Iron deficiency anemia, unspecified: Secondary | ICD-10-CM | POA: Diagnosis not present

## 2017-08-12 DIAGNOSIS — D631 Anemia in chronic kidney disease: Secondary | ICD-10-CM

## 2017-08-12 MED ORDER — SODIUM CHLORIDE 0.9% FLUSH
3.0000 mL | Freq: Once | INTRAVENOUS | Status: DC | PRN
Start: 2017-08-12 — End: 2017-08-12

## 2017-08-12 MED ORDER — SODIUM CHLORIDE 0.9 % IV SOLN
Freq: Once | INTRAVENOUS | Status: AC
  Administered 2017-08-12: 13:00:00 via INTRAVENOUS

## 2017-08-12 MED ORDER — FERUMOXYTOL INJECTION 510 MG/17 ML
510.0000 mg | Freq: Once | INTRAVENOUS | Status: AC
  Administered 2017-08-12: 510 mg via INTRAVENOUS
  Filled 2017-08-12: qty 17

## 2017-08-12 NOTE — Patient Instructions (Signed)
[facility] at [facility] Discharge Instructions  RECOMMENDATIONS MADE BY THE CONSULTANT AND ANY TEST RESULTS WILL BE SENT TO YOUR REFERRING PHYSICIAN.  Feraheme given  Follow up as scheduled.  Thank you for choosing [facility] at [facility] to provide your oncology and hematology care.  To afford each patient quality time with our provider, please arrive at least 15 minutes before your scheduled appointment time.    If you have a lab appointment with the Cancer Center please come in thru the  Main Entrance and check in at the main information desk  You need to re-schedule your appointment should you arrive 10 or more minutes late.  We strive to give you quality time with our providers, and arriving late affects you and other patients whose appointments are after yours.  Also, if you no show three or more times for appointments you may be dismissed from the clinic at the providers discretion.     Again, thank you for choosing [facility].  Our hope is that these requests will decrease the amount of time that you wait before being seen by our physicians.       _____________________________________________________________  Should you have questions after your visit to [facility], please contact our office at [phone] between the hours of 8:30 a.m. and 4:30 p.m.  Voicemails left after 4:30 p.m. will not be returned until the following business day.  For prescription refill requests, have your pharmacy contact our office.       Resources For Cancer Patients and their Caregivers ? American Cancer Society: Can assist with transportation, wigs, general needs, runs Look Good Feel Better.        1-888-227-6333 ? Cancer Care: Provides financial assistance, online support groups, medication/co-pay assistance.  1-800-813-HOPE (4673) ? [facility] Assists [place] cancer patients and their  families through emotional , educational and financial support.  336-427-4357 ? [place] DSS Where to apply for food stamps, Medicaid and utility assistance. 336-342-1394 ? [facility]: Transportation to medical appointments. 336-347-2287 ? Social Security Administration: May apply for disability if have a Stage IV cancer. 336-342-7796 1-800-772-1213 ? [place] Aging, Disability and Transit Services: Assists with nutrition, care and transit needs. 336-349-2343  Cancer Center Support Programs: @10RELATIVEDAYS@ > Cancer Support Group  2nd Tuesday of the month 1pm-2pm, Journey Room  > Creative Journey  3rd Tuesday of the month 1130am-1pm, Journey Room  > Look Good Feel Better  1st Wednesday of the month 10am-12 noon, Journey Room (Call American Cancer [employer] to register 1-800-395-5775)   

## 2017-08-12 NOTE — Progress Notes (Signed)
Treatment given per orders. Patient tolerated it well without problems. Vitals stable and discharged home from clinic ambulatory. Follow up as scheduled.  

## 2017-08-13 ENCOUNTER — Ambulatory Visit (HOSPITAL_COMMUNITY): Payer: Medicare Other | Admitting: Physical Therapy

## 2017-08-13 ENCOUNTER — Emergency Department (HOSPITAL_COMMUNITY): Payer: Medicare Other

## 2017-08-13 ENCOUNTER — Emergency Department (HOSPITAL_COMMUNITY)
Admission: EM | Admit: 2017-08-13 | Discharge: 2017-08-13 | Disposition: A | Discharge status: Home / Self Care | Payer: Medicare Other | Attending: Emergency Medicine | Admitting: Emergency Medicine

## 2017-08-13 ENCOUNTER — Telehealth (HOSPITAL_COMMUNITY): Payer: Self-pay | Admitting: Family Medicine

## 2017-08-13 ENCOUNTER — Encounter (HOSPITAL_COMMUNITY): Payer: Self-pay | Admitting: *Deleted

## 2017-08-13 ENCOUNTER — Other Ambulatory Visit: Payer: Self-pay

## 2017-08-13 DIAGNOSIS — J45909 Unspecified asthma, uncomplicated: Secondary | ICD-10-CM | POA: Diagnosis not present

## 2017-08-13 DIAGNOSIS — K8020 Calculus of gallbladder without cholecystitis without obstruction: Secondary | ICD-10-CM | POA: Diagnosis not present

## 2017-08-13 DIAGNOSIS — J449 Chronic obstructive pulmonary disease, unspecified: Secondary | ICD-10-CM | POA: Insufficient documentation

## 2017-08-13 DIAGNOSIS — R109 Unspecified abdominal pain: Secondary | ICD-10-CM

## 2017-08-13 DIAGNOSIS — Z794 Long term (current) use of insulin: Secondary | ICD-10-CM | POA: Diagnosis not present

## 2017-08-13 DIAGNOSIS — Z7901 Long term (current) use of anticoagulants: Secondary | ICD-10-CM | POA: Insufficient documentation

## 2017-08-13 DIAGNOSIS — R0602 Shortness of breath: Secondary | ICD-10-CM | POA: Diagnosis not present

## 2017-08-13 DIAGNOSIS — Z87891 Personal history of nicotine dependence: Secondary | ICD-10-CM | POA: Insufficient documentation

## 2017-08-13 DIAGNOSIS — N184 Chronic kidney disease, stage 4 (severe): Secondary | ICD-10-CM | POA: Diagnosis not present

## 2017-08-13 DIAGNOSIS — R1030 Lower abdominal pain, unspecified: Secondary | ICD-10-CM | POA: Diagnosis not present

## 2017-08-13 DIAGNOSIS — R1084 Generalized abdominal pain: Secondary | ICD-10-CM | POA: Diagnosis not present

## 2017-08-13 DIAGNOSIS — Z79899 Other long term (current) drug therapy: Secondary | ICD-10-CM | POA: Insufficient documentation

## 2017-08-13 DIAGNOSIS — R21 Rash and other nonspecific skin eruption: Secondary | ICD-10-CM | POA: Diagnosis not present

## 2017-08-13 DIAGNOSIS — I130 Hypertensive heart and chronic kidney disease with heart failure and stage 1 through stage 4 chronic kidney disease, or unspecified chronic kidney disease: Secondary | ICD-10-CM | POA: Diagnosis not present

## 2017-08-13 DIAGNOSIS — I5032 Chronic diastolic (congestive) heart failure: Secondary | ICD-10-CM | POA: Insufficient documentation

## 2017-08-13 DIAGNOSIS — E1122 Type 2 diabetes mellitus with diabetic chronic kidney disease: Secondary | ICD-10-CM | POA: Diagnosis not present

## 2017-08-13 DIAGNOSIS — R05 Cough: Secondary | ICD-10-CM | POA: Diagnosis not present

## 2017-08-13 LAB — URINALYSIS, ROUTINE W REFLEX MICROSCOPIC
BILIRUBIN URINE: NEGATIVE
Bacteria, UA: NONE SEEN
GLUCOSE, UA: NEGATIVE mg/dL
HGB URINE DIPSTICK: NEGATIVE
Ketones, ur: NEGATIVE mg/dL
NITRITE: NEGATIVE
Protein, ur: 30 mg/dL — AB
SPECIFIC GRAVITY, URINE: 1.012 (ref 1.005–1.030)
pH: 5 (ref 5.0–8.0)

## 2017-08-13 LAB — CBG MONITORING, ED
Glucose-Capillary: 38 mg/dL — CL (ref 65–99)
Glucose-Capillary: 24 mg/dL — CL (ref 65–99)

## 2017-08-13 LAB — COMPREHENSIVE METABOLIC PANEL
ALK PHOS: 81 U/L (ref 38–126)
ALT: 22 U/L (ref 14–54)
AST: 25 U/L (ref 15–41)
Albumin: 3.1 g/dL — ABNORMAL LOW (ref 3.5–5.0)
Anion gap: 10 (ref 5–15)
BUN: 61 mg/dL — AB (ref 6–20)
CALCIUM: 8.9 mg/dL (ref 8.9–10.3)
CO2: 24 mmol/L (ref 22–32)
CREATININE: 2.28 mg/dL — AB (ref 0.44–1.00)
Chloride: 105 mmol/L (ref 101–111)
GFR, EST AFRICAN AMERICAN: 24 mL/min — AB (ref >60)
GFR, EST NON AFRICAN AMERICAN: 20 mL/min — AB (ref >60)
Glucose, Bld: 52 mg/dL — ABNORMAL LOW (ref 65–99)
Potassium: 3.2 mmol/L — ABNORMAL LOW (ref 3.5–5.1)
Sodium: 139 mmol/L (ref 135–145)
Total Bilirubin: 0.9 mg/dL (ref 0.3–1.2)
Total Protein: 6.9 g/dL (ref 6.5–8.1)

## 2017-08-13 LAB — CBC WITH DIFFERENTIAL/PLATELET
BASOS ABS: 0 10*3/uL (ref 0.0–0.1)
BASOS PCT: 1 %
EOS PCT: 1 %
Eosinophils Absolute: 0 10*3/uL (ref 0.0–0.7)
HEMATOCRIT: 32.5 % — AB (ref 36.0–46.0)
Hemoglobin: 9.6 g/dL — ABNORMAL LOW (ref 12.0–15.0)
LYMPHS ABS: 0.5 10*3/uL — AB (ref 0.7–4.0)
LYMPHS PCT: 8 %
MCH: 25.2 pg — ABNORMAL LOW (ref 26.0–34.0)
MCHC: 29.5 g/dL — ABNORMAL LOW (ref 30.0–36.0)
MCV: 85.3 fL (ref 78.0–100.0)
MONO ABS: 0.2 10*3/uL (ref 0.1–1.0)
Monocytes Relative: 4 %
Neutro Abs: 4.8 10*3/uL (ref 1.7–7.7)
Neutrophils Relative %: 86 %
PLATELETS: 260 10*3/uL (ref 150–400)
RBC: 3.81 MIL/uL — AB (ref 3.87–5.11)
RDW: 17.7 % — AB (ref 11.5–15.5)
WBC: 5.5 10*3/uL (ref 4.0–10.5)

## 2017-08-13 LAB — GLUCOSE, RANDOM: GLUCOSE: 83 mg/dL (ref 65–99)

## 2017-08-13 LAB — PROTIME-INR
INR: 2.22
Prothrombin Time: 24.4 seconds — ABNORMAL HIGH (ref 11.4–15.2)

## 2017-08-13 LAB — TROPONIN I: Troponin I: 0.03 ng/mL (ref <0.03)

## 2017-08-13 LAB — LIPASE, BLOOD: LIPASE: 24 U/L (ref 11–51)

## 2017-08-13 MED ORDER — HYDROCODONE-ACETAMINOPHEN 5-325 MG PO TABS: ORAL_TABLET | ORAL | 0 refills | Status: DC

## 2017-08-13 MED ORDER — ACYCLOVIR 800 MG PO TABS: 800.0000 mg | ORAL_TABLET | Freq: Every day | ORAL | 0 refills | Status: DC

## 2017-08-13 MED ORDER — ACYCLOVIR 800 MG PO TABS: 800.0000 mg | ORAL_TABLET | Freq: Three times a day (TID) | ORAL | 0 refills | Status: AC

## 2017-08-13 MED ORDER — DEXTROSE 50 % IV SOLN
INTRAVENOUS | Status: AC
  Filled 2017-08-13: qty 50

## 2017-08-13 MED ORDER — HYDROCODONE-ACETAMINOPHEN 5-325 MG PO TABS
1.0000 | ORAL_TABLET | Freq: Once | ORAL | Status: AC
  Administered 2017-08-13: 1 via ORAL
  Filled 2017-08-13: qty 1

## 2017-08-13 MED ORDER — DEXTROSE 50 % IV SOLN
50.0000 mL | Freq: Once | INTRAVENOUS | Status: AC
  Administered 2017-08-13: 50 mL via INTRAVENOUS

## 2017-08-13 NOTE — ED Provider Notes (Signed)
[facility] EMERGENCY DEPARTMENT Provider Note   CSN: [id number] Arrival date & time: 08/13/17  9379     History   Chief Complaint Chief Complaint  Patient presents with  . Abdominal Pain  . Rash    HPI [name] is a 71 y.o. female.  HPI  Pt was seen at 0950.  Per pt, c/o gradual onset and persistence of constant generalized abd "pain" for the past 4 to 5 days.  Has been associated with nausea.  Describes the abd pain as "tightness." States the pain starts in her bilat lower back and wraps around to her entire abd. Pt was evaluated in the ED 2 days ago for rash right hip that has been present x2 weeks, pt has been taking the prescriptions as directed; denies any change in rash. Denies vomiting/diarrhea, no fevers, no back pain, no rash, no CP/SOB, no black or blood in stools.      Past Medical History:  Diagnosis Date  . Anemia   . Asthma   . Atrial flutter [facility])    TEE/DCCV December 2013 - on Xarelto [facility])  . Bone spur of toe    Right 5th toe  . Chronic diastolic CHF (congestive heart failure) (HCC)    a. EF 50-55% by echo in 2015  . CKD (chronic kidney disease) stage 4, GFR 15-29 ml/min (HCC)    a. s/p fistula placement but not on HD. Followed by Nephrology.   . DVT of upper extremity (deep vein thrombosis) ([place])    Right basilic vein, June 0240  . Essential hypertension, benign   . SLE (systemic lupus erythematosus) ([place])   . Type 2 diabetes mellitus ([place])   . UTI (lower urinary tract infection)     Patient Active Problem List   Diagnosis Date Noted  . Iron deficiency anemia 08/07/2017  . Heme positive stool 10/04/2015  . Mixed hyperlipidemia 06/29/2015  . Type 2 diabetes mellitus with stage 4 chronic kidney disease ([place]) 06/29/2015  . Encounter for adequacy testing for hemodialysis ([place]) 04/12/2014  . Chronic kidney disease, stage V ([place]) 04/12/2014  . 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  Encounter for therapeutic drug monitoring 03/30/2014  . Chronic diastolic heart failure ([place]) 03/15/2014  . Renal failure (ARF), acute on chronic (HCC) 03/13/2014  . Anemia of chronic renal failure, stage 4 (severe) ([place]) 02/23/2014  . Folate deficiency 02/17/2014  . Aortic regurgitation 09/19/2012  . Atrial flutter ([place]) 09/14/2012  . Class 2 severe obesity due to excess calories with serious comorbidity and body mass index (BMI) of 35.0 to 35.9 in adult ([place]) 09/14/2012  . Type 2 diabetes mellitus with diabetic neuropathy ([place]) 09/14/2012  . SLE (systemic lupus erythematosus) ([place]) 09/14/2012  . Essential hypertension, benign 09/14/2012  . COPD (chronic obstructive pulmonary disease) ([place]) 09/14/2012    Past Surgical History:  Procedure Laterality Date  . ABDOMINAL HYSTERECTOMY  1983  . ANKLE SURGERY  1993  . AV FISTULA PLACEMENT Left 03/27/2014   Procedure: ARTERIOVENOUS (AV) FISTULA CREATION;  Surgeon: Rosetta Posner, MD;  Location: [place];  Service: Vascular;  Laterality: Left;  . BACK SURGERY  1980  . CARDIOVASCULAR STRESS TEST  12/19/2009   no stress induced rhythm abnormalities, ekg negative for ischemia  . CARDIOVERSION  09/16/2012   Procedure: CARDIOVERSION;  Surgeon: Sanda Klein, MD;  Location: [facility] ENDOSCOPY;  Service: Cardiovascular;  Laterality: N/A;  . COLONOSCOPY  09/20/2012   Dr. [name]: multiple tubular adenomas   .  COLONOSCOPY  2005   Dr. [name]. Polyps, path unknown   . COLONOSCOPY N/A 07/24/2016   Procedure: COLONOSCOPY;  Surgeon: [name], MD;  [facility];  Service: Endoscopy;  Laterality: N/A;  9:00 am  . ESOPHAGOGASTRODUODENOSCOPY  09/20/2012   Dr. [name]: duodenal erosion and possible resolving ulcer at the angularis   . TEE WITHOUT CARDIOVERSION  09/16/2012   Procedure: TRANSESOPHAGEAL ECHOCARDIOGRAM (TEE);  Surgeon: Sanda Klein, MD;  Location: [facility] ENDOSCOPY;  Service: Cardiovascular;  Laterality: N/A;  . TRANSESOPHAGEAL ECHOCARDIOGRAM  WITH CARDIOVERSION  09/16/2012   EF 60-65%, moderate LVH, moderate regurg of the aortic valve, LA moderately dilated    OB History    No data available       Home Medications    Prior to Admission medications   Medication Sig Start Date End Date Taking? Authorizing Provider  acetaminophen (TYLENOL) 500 MG tablet Take 1,000 mg by mouth every 6 (six) hours as needed. For pain   Yes [provider]  albuterol (ACCUNEB) 0.63 MG/3ML nebulizer solution Take 1 ampule by nebulization every 6 (six) hours as needed for wheezing or shortness of breath.    Yes [provider]  albuterol (PROVENTIL HFA;VENTOLIN HFA) 108 (90 BASE) MCG/ACT inhaler Inhale 2 puffs into the lungs every 6 (six) hours as needed. For shortness of breath   Yes [provider]  amLODipine (NORVASC) 10 MG tablet Take 1 tablet (10 mg total) by mouth at bedtime. 03/28/14  Yes [name], MD  atorvastatin (LIPITOR) 40 MG tablet Take 40 mg at bedtime by mouth.    Yes [provider]  brimonidine-timolol (COMBIGAN) 0.2-0.5 % ophthalmic solution Place 1 drop into the left eye daily.     [provider]  calcitRIOL (ROCALTROL) 0.25 MCG capsule Take 0.25 mcg by mouth as directed. Take on Mondays Wednesdays and Fridays    [provider]  Continuous Blood Gluc Sensor ([place]) MISC Use one sensor every 10 days. 07/24/17   Cassandria Anger, MD  diclofenac (VOLTAREN) 0.1 % ophthalmic solution Place 1 drop into the left eye daily. Uses at lunch time    [provider]  doxycycline (VIBRAMYCIN) 100 MG capsule Take 1 capsule (100 mg total) 2 (two) times daily by mouth. 08/11/17   Nat Christen, MD  febuxostat (ULORIC) 40 MG tablet Take 40 mg by mouth daily.    [provider]  folic acid (FOLVITE) 1 MG tablet Take 1 mg by mouth daily.    [provider]  furosemide (LASIX) 80 MG tablet Take 160 mg by mouth 2 (two) times daily.      [provider]  HUMALOG KWIKPEN 100 UNIT/ML KiwkPen INJECT UP TO 11 UNITS SUBCUTANEOUSLY BEFORE MEALS THREE TIMES DAILY. 07/24/17   Cassandria Anger, MD  HYDROcodone-acetaminophen (NORCO/VICODIN) 5-325 MG per tablet Take 1-2 tablets by mouth every 4 (four) hours as needed for moderate pain. 03/28/14   [name], MD  hydroxychloroquine (PLAQUENIL) 200 MG tablet Take 200 mg by mouth as directed. Take 1 1/2 tablets daily    [provider]  Insulin Detemir (LEVEMIR FLEXTOUCH [place]) Inject 15 Units into the skin at bedtime.    [provider]  metoprolol tartrate (LOPRESSOR) 50 MG tablet TAKE ONE TABLET BY MOUTH TWICE DAILY. 07/23/17   Satira Sark, MD  Misc Natural Products [facility] ADVANCED) CAPS Take 1 capsule by mouth 2 (two) times daily.    [provider]  Multiple  Vitamins-Minerals (CENTRUM SILVER PO) Take 1 tablet by mouth daily.    [provider]  nystatin (MYCOSTATIN/NYSTOP) powder Apply 4 (four) times daily topically. 08/11/17   Nat Christen, MD  ONE TOUCH ULTRA TEST test strip TEST 4 TIMES A DAY AS DIRECTED. 06/09/17   [name], [name], MD  ONETOUCH DELICA LANCETS 87F MISC USE TO TEST 4 TIMES DAILY. 09/05/15   Cassandria Anger, MD  predniSONE (DELTASONE) 5 MG tablet Take 5 mg by mouth daily with breakfast.    [provider]  travoprost, benzalkonium, (TRAVATAN) 0.004 % ophthalmic solution Place 1 drop into the left eye at bedtime.    [provider]  UNIFINE PENTIPS 31G X 6 MM MISC USE AS DIRECTED AT BEDTIME WITH LEVEMIR AND WITH SLIDING SCALE INSULIN. 03/30/17   [name], [name], MD  Vitamin D, Ergocalciferol, (DRISDOL) 50000 units CAPS capsule TAKE 1 CAPSULE BY MOUTH ONCE WEEKLY. 06/17/17   Cassandria Anger, MD  warfarin (COUMADIN) 5 MG tablet Take 1 tablet daily 01/26/17   Satira Sark, MD    Family History Family History  Problem Relation Age of Onset  . Diabetes Brother   .  Diabetes Father   . Hypertension Father   . Hypertension Sister   . Stroke Mother   . Diabetes Sister   . Hypertension Brother   . Colon cancer Neg Hx     Social History Social History   Tobacco Use  . Smoking status: Former Smoker    Packs/day: 1.00    Years: 30.00    Pack years: 30.00    Types: Cigarettes    Last attempt to quit: 08/29/2012    Years since quitting: 4.9  . Smokeless tobacco: Never Used  . Tobacco comment: quit 08-2012  Substance Use Topics  . Alcohol use: No    Alcohol/week: 0.0 oz  . Drug use: No     Allergies   Ace inhibitors   Review of Systems Review of Systems ROS: Statement: All systems negative except as marked or noted in the HPI; Constitutional: Negative for fever and chills. ; ; Eyes: Negative for eye pain, redness and discharge. ; ; ENMT: Negative for ear pain, hoarseness, nasal congestion, sinus pressure and sore throat. ; ; Cardiovascular: Negative for chest pain, palpitations, diaphoresis, dyspnea and peripheral edema. ; ; Respiratory: Negative for cough, wheezing and stridor. ; ; Gastrointestinal: +nausea, abd pain. Negative for vomiting, diarrhea, blood in stool, hematemesis, jaundice and rectal bleeding. . ; ; Genitourinary: Negative for dysuria, flank pain and hematuria. ; ; Musculoskeletal: Negative for back pain and neck pain. Negative for swelling and trauma.; ; Skin: +red rash with blisters. Negative for pruritus, abrasions, bruising and skin lesion.; ; Neuro: Negative for headache, lightheadedness and neck stiffness. Negative for weakness, altered level of consciousness, altered mental status, extremity weakness, paresthesias, involuntary movement, seizure and syncope.       Physical Exam Updated Vital Signs BP (!) 170/46   Pulse 65   Temp 97.7 F (36.5 C) (Oral)   Resp 16   Ht 5\' 5"  (1.651 m)   Wt 92.5 kg (204 lb)   SpO2 98%   BMI 33.95 kg/m   Physical Exam 0955: Physical examination:  Nursing notes reviewed; Vital signs  and O2 SAT reviewed;  Constitutional: Well developed, Well nourished, Well hydrated, In no acute distress; Head:  Normocephalic, atraumatic; Eyes: EOMI, PERRL, No scleral icterus; ENMT: Mouth and pharynx normal, Mucous membranes moist; Neck: Supple, Full range of motion, No lymphadenopathy;  Cardiovascular: Regular rate and rhythm, No gallop; Respiratory: Breath sounds clear & equal bilaterally, No wheezes.  Speaking full sentences with ease, Normal respiratory effort/excursion; Chest: Nontender, Movement normal; Abdomen: Soft, +diffuse tenderness to palp. No rebound or guarding. No rash to abd wall.  Nondistended, Normal bowel sounds; Genitourinary: No CVA tenderness; Spine:  No midline CS, TS, LS tenderness. +TTP bilat lumbar paraspinal muscles. No rash to back or buttocks.;; Extremities: Pulses normal, +round erythematous area to right lateral hip with a cluster of small blisters, no pustules, no drainage, no open wounds, no fluctuance, no streaking, no linear erythema or blisters.  +erythema and superficial excoriation underneath right pannus, no blisters. No deformity, No edema, No calf edema or asymmetry.; Neuro: AA&Ox3, Major CN grossly intact.  Speech clear. No gross focal motor or sensory deficits in extremities.; Skin: Color normal, Warm, Dry.   ED Treatments / Results  Labs (all labs ordered are listed, but only abnormal results are displayed)   EKG  EKG Interpretation  Date/Time:  Thursday August 13 2017 14:29:48 EST Ventricular Rate:  63 PR Interval:    QRS Duration: 102 QT Interval:  486 QTC Calculation: 498 R Axis:   15 Text Interpretation:  Sinus rhythm Ventricular premature complex Left ventricular hypertrophy T wave abnormality Diffuse When compared with ECG of 06/11/2017 No significant change was found Confirmed by Francine Graven [phone]) on 08/13/2017 2:43:15 PM       Radiology   Procedures Procedures (including critical care time)  Medications Ordered in  ED Medications - No data to display   Initial Impression / Assessment and Plan / ED Course  I have reviewed the triage vital signs and the nursing notes.  Pertinent labs & imaging results that were available during my care of the patient were reviewed by me and considered in my medical decision making (see chart for details).  MDM Reviewed: previous chart, nursing note and vitals Reviewed previous: labs Interpretation: labs and CT scan   Results for orders placed or performed during the hospital encounter of 08/13/17  Comprehensive metabolic panel  Result Value Ref Range   Sodium 139 135 - 145 mmol/L   Potassium 3.2 (L) 3.5 - 5.1 mmol/L   Chloride 105 101 - 111 mmol/L   CO2 24 22 - 32 mmol/L   Glucose, Bld 52 (L) 65 - 99 mg/dL   BUN 61 (H) 6 - 20 mg/dL   Creatinine, Ser 2.28 (H) 0.44 - 1.00 mg/dL   Calcium 8.9 8.9 - 10.3 mg/dL   Total Protein 6.9 6.5 - 8.1 g/dL   Albumin 3.1 (L) 3.5 - 5.0 g/dL   AST 25 15 - 41 U/L   ALT 22 14 - 54 U/L   Alkaline Phosphatase 81 38 - 126 U/L   Total Bilirubin 0.9 0.3 - 1.2 mg/dL   GFR calc non Af Amer 20 (L) >60 mL/min   GFR calc Af Amer 24 (L) >60 mL/min   Anion gap 10 5 - 15  Lipase, blood  Result Value Ref Range   Lipase 24 11 - 51 U/L  CBC with Differential  Result Value Ref Range   WBC 5.5 4.0 - 10.5 K/uL   RBC 3.81 (L) 3.87 - 5.11 MIL/uL   Hemoglobin 9.6 (L) 12.0 - 15.0 g/dL   HCT 32.5 (L) 36.0 - 46.0 %   MCV 85.3 78.0 - 100.0 fL   MCH 25.2 (L) 26.0 - 34.0 pg   MCHC 29.5 (L) 30.0 - 36.0 g/dL   RDW 17.7 (  H) 11.5 - 15.5 %   Platelets 260 150 - 400 K/uL   Neutrophils Relative % 86 %   Neutro Abs 4.8 1.7 - 7.7 K/uL   Lymphocytes Relative 8 %   Lymphs Abs 0.5 (L) 0.7 - 4.0 K/uL   Monocytes Relative 4 %   Monocytes Absolute 0.2 0.1 - 1.0 K/uL   Eosinophils Relative 1 %   Eosinophils Absolute 0.0 0.0 - 0.7 K/uL   Basophils Relative 1 %   Basophils Absolute 0.0 0.0 - 0.1 K/uL  Troponin I  Result Value Ref Range   Troponin I  0.03 (HH) <0.03 ng/mL  Urinalysis, Routine w reflex microscopic  Result Value Ref Range   Color, Urine YELLOW YELLOW   APPearance CLEAR CLEAR   Specific Gravity, Urine 1.012 1.005 - 1.030   pH 5.0 5.0 - 8.0   Glucose, UA NEGATIVE NEGATIVE mg/dL   Hgb urine dipstick NEGATIVE NEGATIVE   Bilirubin Urine NEGATIVE NEGATIVE   Ketones, ur NEGATIVE NEGATIVE mg/dL   Protein, ur 30 (A) NEGATIVE mg/dL   Nitrite NEGATIVE NEGATIVE   Leukocytes, UA LARGE (A) NEGATIVE   RBC / HPF 6-30 0 - 5 RBC/hpf   WBC, UA 0-5 0 - 5 WBC/hpf   Bacteria, UA NONE SEEN NONE SEEN   Squamous Epithelial / LPF 0-5 (A) NONE SEEN   Hyaline Casts, UA PRESENT   Protime-INR  Result Value Ref Range   Prothrombin Time 24.4 (H) 11.4 - 15.2 seconds   INR 2.22   Glucose, random  Result Value Ref Range   Glucose, Bld 83 65 - 99 mg/dL  Troponin I  Result Value Ref Range   Troponin I <0.03 <0.03 ng/mL  CBG monitoring, ED  Result Value Ref Range   Glucose-Capillary 38 (LL) 65 - 99 mg/dL   Comment 1 Notify RN   CBG monitoring, ED  Result Value Ref Range   Glucose-Capillary <10 (LL) 65 - 99 mg/dL   Comment 1 Repeat Test   CBG monitoring, ED  Result Value Ref Range   Glucose-Capillary 24 (LL) 65 - 99 mg/dL   Comment 1 Repeat Test    Ct Abdomen Pelvis Wo Contrast Result Date: 08/13/2017 CLINICAL DATA:  Unspecified abdominal pain.  Bilateral leg pain. EXAM: CT ABDOMEN AND PELVIS WITHOUT CONTRAST TECHNIQUE: Multidetector CT imaging of the abdomen and pelvis was performed following the standard protocol without IV contrast. COMPARISON:  Abdominal CT 01/10/2014 FINDINGS: Lower chest: Trace left pleural effusion seen medially, also present in 2005. Few septal lines at the bases without generalized edema. Hepatobiliary: Subtle layering cholelithiasis. Subcentimeter low-density in the right liver, too small for densitometry. Pancreas: Unremarkable. Spleen: Unremarkable. Adrenals/Urinary Tract: Negative adrenals. Renal hilar  calcifications appear vascular. No hydronephrosis or ureteral stone. Unremarkable bladder. Stomach/Bowel:  No obstruction. No appendicitis. Vascular/Lymphatic: No acute vascular abnormality. Diffuse atherosclerotic calcification of the aorta and its branches. No mass or adenopathy. Reproductive:Hysterectomy.  Negative adnexae. Other: No ascites or pneumoperitoneum. Musculoskeletal: Skin thickening along the right back and flank, roughly in a horizontal band respecting midline. Subcutaneous reticulation over the right greater trochanter more than left greater trochanter of the femur, likely chronic pressure change/scarring. Diffuse degenerative disc narrowing. Osteopenia. No acute osseous finding. IMPRESSION: 1. Right back/ flank skin thickening correlating with history of rash. Question shingles with this distribution. 2. No acute intra-abdominal finding. 3. Trace partially loculated left pleural effusion. 4. Subtle cholelithiasis. Electronically Signed   By: Monte Fantasia M.D.   On: 08/13/2017 12:59   Dg Chest 2  View Result Date: 08/13/2017 CLINICAL DATA:  Cough, congestion, and shortness of breath. EXAM: CHEST  2 VIEW COMPARISON:  Chest x-ray dated March 16, 2014. FINDINGS: Stable moderate cardiomegaly. Mild pulmonary vascular congestion. Aortic atherosclerosis. Borderline aneurysmal dilatation of the ascending thoracic aorta. No pleural effusion or pneumothorax. Right basilar atelectasis. No acute osseous abnormality. IMPRESSION: 1. Stable moderate cardiomegaly. Mild pulmonary vascular congestion. 2. Aortic atherosclerosis with borderline aneurysmal dilatation of the ascending thoracic aorta, measuring 4.1 cm. Recommend annual imaging followup by CTA or MRA. This recommendation follows 2010 ACCF/AHA/AATS/ACR/ASA/SCA/SCAI/SIR/STS/SVM Guidelines for the Diagnosis and Management of Patients with Thoracic Aortic Disease. Circulation. 2010; 121: Q222-L798 Electronically Signed   By: Titus Dubin M.D.   On:  08/13/2017 11:25   Results for INDEA, DEARMAN (MRN [id number]) as of 08/13/2017 14:52  Ref. Range 07/17/2017 10:25 08/05/2017 10:32 08/13/2017 10:21  BUN Latest Ref Range: 6 - 20 mg/dL 66 (H) 85 (H) 61 (H)  Creatinine Latest Ref Range: 0.44 - 1.00 mg/dL 2.34 (H) 2.48 (H) 2.28 (H)     1440:  H/H, BUN/Cr per baseline. Pt's CBG low; pt states she took her insulin today but did not eat. PO meal given with improvement in CBG. Workup reassuring. No rash to torso, but given CT findings, will tx with acyclovir. Pt states she wants to go home now. No clear indication for admission at this time. PharmD called:  Change dose of acyclovir 800mg  PO q8h x7 days for decreased GFR. Dx and testing d/w pt and family.  Questions answered.  Verb understanding, agreeable to d/c home with outpt f/u.    Final Clinical Impressions(s) / ED Diagnoses   Final diagnoses:  None    ED Discharge Orders    None       Francine Graven, DO 08/17/17 2046

## 2017-08-13 NOTE — ED Triage Notes (Addendum)
Pt c/o bilateral lower back pain that wraps around both sides of abdomen with rash that starts on back and wraps around right side of lower abdomen that started yesterday. Pt also c/o nausea. Denies vomiting, diarrhea, fever. Pt was seen here 2 days ago and given a powder for the rash and antibiotics.

## 2017-08-13 NOTE — Telephone Encounter (Signed)
08/13/17  husband came by the office... patient is in the ED

## 2017-08-13 NOTE — Discharge Instructions (Signed)
Take the new prescriptions as directed. Continue to take your usual prescriptions as previously directed. Your Radiology study today showed an incidental finding:  "Aortic atherosclerosis with borderline aneurysmal dilatation of the ascending thoracic aorta, measuring 4.1 cm. Recommend annual imaging followup by CTA or MRA. This recommendation follows 2010 ACCF/AHA/AATS/ACR/ASA/SCA/SCAI/SIR/STS/SVM Guidelines for the Diagnosis and Management of Patients with Thoracic Aortic Disease.Circulation. 2010; 121: [id number]."  Your regular medical doctor can follow up this finding.  Call your regular medical doctor today to schedule a follow up appointment within the next 2 days.  Return to the Emergency Department immediately sooner if worsening.

## 2017-08-14 DIAGNOSIS — L03311 Cellulitis of abdominal wall: Secondary | ICD-10-CM | POA: Diagnosis not present

## 2017-08-14 DIAGNOSIS — Z1389 Encounter for screening for other disorder: Secondary | ICD-10-CM | POA: Diagnosis not present

## 2017-08-14 LAB — CBG MONITORING, ED

## 2017-08-14 LAB — URINE CULTURE

## 2017-08-17 ENCOUNTER — Ambulatory Visit (HOSPITAL_COMMUNITY): Payer: Medicare Other | Admitting: Physical Therapy

## 2017-08-17 DIAGNOSIS — S81801D Unspecified open wound, right lower leg, subsequent encounter: Secondary | ICD-10-CM

## 2017-08-17 DIAGNOSIS — R262 Difficulty in walking, not elsewhere classified: Secondary | ICD-10-CM

## 2017-08-17 DIAGNOSIS — S81802D Unspecified open wound, left lower leg, subsequent encounter: Secondary | ICD-10-CM

## 2017-08-17 DIAGNOSIS — M79605 Pain in left leg: Secondary | ICD-10-CM | POA: Diagnosis not present

## 2017-08-17 NOTE — Therapy (Signed)
[place], [place], [address] Phone: [phone]   Fax:  [phone]  Wound Care Therapy  Patient Details  Name: [name] MRN: [id number] Date of Birth: [birth year] Referring Provider: Sharilyn Sites   Encounter Date: 08/17/2017  PT End of Session - 08/17/17 1229    Visit Number  20    Number of Visits  24    Date for PT Re-Evaluation  08/21/17    Authorization Type  [employer] medicare  cert 73/2-20/25, gcodes done visit #9    Authorization - Visit Number  20    Authorization - Number of Visits  29    PT Start Time  1035    PT Stop Time  1105    PT Time Calculation (min)  30 min    Activity Tolerance  Patient tolerated treatment well    Behavior During Therapy  [facility] for tasks assessed/performed       Past Medical History:  Diagnosis Date  . Anemia   . Asthma   . Atrial flutter [facility])    TEE/DCCV December 2013 - on Xarelto [facility])  . Bone spur of toe    Right 5th toe  . Chronic diastolic CHF (congestive heart failure) (HCC)    a. EF 50-55% by echo in 2015  . CKD (chronic kidney disease) stage 4, GFR 15-29 ml/min (HCC)    a. s/p fistula placement but not on HD. Followed by Nephrology.   . DVT of upper extremity (deep vein thrombosis) ([place])    Right basilic vein, June 4270  . Essential hypertension, benign   . SLE (systemic lupus erythematosus) ([place])   . Type 2 diabetes mellitus ([place])   . UTI (lower urinary tract infection)     Past Surgical History:  Procedure Laterality Date  . ABDOMINAL HYSTERECTOMY  1983  . ANKLE SURGERY  1993  . ARTERIOVENOUS (AV) FISTULA CREATION Left 03/27/2014   Performed by Rosetta Posner, MD at [place]  . BACK SURGERY  1980  . CARDIOVASCULAR STRESS TEST  12/19/2009   no stress induced rhythm abnormalities, ekg negative for ischemia  . CARDIOVERSION N/A 09/16/2012   Performed by Sanda Klein, MD at [place]  . COLONOSCOPY  2005   Dr. [name]. Polyps, path unknown   .  COLONOSCOPY N/A 07/24/2016   Performed by [name], MD at [place]  . COLONOSCOPY Left 09/20/2012   Performed by Cleotis Nipper, MD at [facility] ENDOSCOPY  . ESOPHAGOGASTRODUODENOSCOPY (EGD) Left 09/20/2012   Performed by Cleotis Nipper, MD at [place]  . TRANSESOPHAGEAL ECHOCARDIOGRAM (TEE) N/A 09/16/2012   Performed by Sanda Klein, MD at [place]  . TRANSESOPHAGEAL ECHOCARDIOGRAM WITH CARDIOVERSION  09/16/2012   EF 60-65%, moderate LVH, moderate regurg of the aortic valve, LA moderately dilated    There were no vitals filed for this visit.              Wound Therapy - 08/17/17 1219    Subjective  Pt has appointment December 6 with Dr Oneida Alar, which is a vascular surgeon.      Pain Score  5     Pain Type  Chronic pain    Pain Location  Leg bilaterally    Pain Orientation  Right;Left    Pain Descriptors / Indicators  Jabbing    Pain Onset  Progressive    Patients Stated Pain Goal  0    Pain Intervention(s)  Rest  Multiple Pain Sites  No    Wound Properties Date First Assessed: 06/25/17 Time First Assessed: 1100 Wound Type: Non-pressure wound Location: Leg Location Orientation: Posterior;Medial;Right Wound Description (Comments): blister   Dressing Type  Silver hydrofiber    Dressing Changed  Changed    Dressing Status  Old drainage    Dressing Change Frequency  PRN    Site / Wound Assessment  Pale;Pink;Yellow    % Wound base Red or Granulating  10%    % Wound base Yellow/Fibrinous Exudate  90%    Peri-wound Assessment  Intact    Wound Length (cm)  1 cm    Wound Width (cm)  1.5 cm    Wound Depth (cm)  0.3 cm    Wound Volume (cm^3)  0.45 cm^3    Wound Surface Area (cm^2)  1.5 cm^2    Margins  Epibole (rolled edges)    Drainage Amount  Minimal    Treatment  Cleansed;Debridement (Selective)    Wound Properties Date First Assessed: 06/04/17 Time First Assessed: 5643 Wound Type: Non-pressure wound Location: Leg Location Orientation:  Left;Lateral;Lower Present on Admission: Yes   Dressing Type  Gauze (Comment);Hydrogel;Silver hydrofiber    Dressing Changed  Changed    Dressing Status  Dry    Dressing Change Frequency  PRN    Site / Wound Assessment  Dry    % Wound base Red or Granulating  85%    % Wound base Yellow/Fibrinous Exudate  15%    Wound Length (cm)  1.5 cm    Wound Width (cm)  2.3 cm    Wound Depth (cm)  0.1 cm    Wound Volume (cm^3)  0.34 cm^3    Wound Surface Area (cm^2)  3.45 cm^2    Selective Debridement - Location  both wounds     Selective Debridement - Tools Used  Forceps;Scalpel    Selective Debridement - Tissue Removed  slough    Wound Therapy - Clinical Statement  Remeasured wounds this session with slight change, however not impressive for wound healing.  Pt remains sensitive to debridement.  able to cleanse well and continued with silver to Rt LE, however changed to xeroform for Lt due to dryness.  Pt goes for consult on 12/6 regarding possible arterial stents.   Debara Pickett, PT, also completed debridement on LE wounds this session and completed bandaging.    Wound Therapy - Functional Problem List  difficulty in walking     Factors Delaying/Impairing Wound Healing  Altered sensation;Multiple medical problems;Vascular compromise    Hydrotherapy Plan  Debridement;Dressing change;Patient/family education    Wound Plan  Continue with wound care.                PT Short Term Goals - 07/09/17 1337      PT SHORT TERM GOAL #1   Title  Pt to understand signs and symptoms of cellulits and the importance of contacting MD if this occurs     Time  1    Period  Weeks    Status  Achieved      PT SHORT TERM GOAL #2   Title  PT lateral wound to be 75% granulated and to have no black eschar to reduce the risk of infection.    Baseline  -- currently no black eschar; 60% granulated     Time  2    Period  Weeks    Status  On-going      PT SHORT TERM GOAL #3  Title  Pt pain to be decreased to  no greater than a 4/10 to allow pt to rest better    Baseline  -- 07/09/2017:  pain is no greater than a 4/10 except when debriding.  Pt unable to walk 10 minutes due to CHF revised goal to be so that pt can rest better.     Time  3    Period  Weeks    Status  Revised        PT Long Term Goals - 07/09/17 1340      PT LONG TERM GOAL #1   Title  Pt wound to be 100% granulated to prevent infection     Time  4    Period  Weeks    Status  On-going      PT LONG TERM GOAL #2   Title  PT lateral  wound size to be no greater than 2x2 to allow pt to feel comfortable in self care of wound ; anterior wound to be healed     Time  6    Period  Weeks    Status  On-going      PT LONG TERM GOAL #3   Title  If profore felt comfortable to pt pt to be educated in compression garments and why they may be benefical for pt.  Pt has had wounds come up on her Rt leg in the past as well     Baseline  -- 07/09/2017:  Await result of ABI    Time  6    Period  Weeks    Status  Deferred              Patient will benefit from skilled therapeutic intervention in order to improve the following deficits and impairments:     Visit Diagnosis: Pain in left leg  Leg wound, left, subsequent encounter  Leg wound, right, subsequent encounter  Difficulty in walking, not elsewhere classified     Problem List Patient Active Problem List   Diagnosis Date Noted  . Iron deficiency anemia 08/07/2017  . Heme positive stool 10/04/2015  . Mixed hyperlipidemia 06/29/2015  . Type 2 diabetes mellitus with stage 4 chronic kidney disease ([place]) 06/29/2015  . Encounter for adequacy testing for hemodialysis ([place]) 04/12/2014  . Chronic kidney disease, stage V ([place]) 04/12/2014  . 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 Encounter for therapeutic drug monitoring 03/30/2014  . Chronic diastolic heart failure ([place]) 03/15/2014  . Renal failure (ARF), acute on  chronic (HCC) 03/13/2014  . Anemia of chronic renal failure, stage 4 (severe) ([place]) 02/23/2014  . Folate deficiency 02/17/2014  . Aortic regurgitation 09/19/2012  . Atrial flutter ([place]) 09/14/2012  . Class 2 severe obesity due to excess calories with serious comorbidity and body mass index (BMI) of 35.0 to 35.9 in adult ([place]) 09/14/2012  . Type 2 diabetes mellitus with diabetic neuropathy ([place]) 09/14/2012  . SLE (systemic lupus erythematosus) ([place]) 09/14/2012  . Essential hypertension, benign 09/14/2012  . COPD (chronic obstructive pulmonary disease) ([place]) 09/14/2012   Teena Irani, PTA/CLT [id number]  Roseanne Reno B 08/17/2017, 12:41 PM   Debara Pickett, [place], [place], [address] Phone: [phone]   Fax:  [phone]  Name: CICILY BONANO MRN: [id number] Date of Birth: [birth year]

## 2017-08-19 ENCOUNTER — Ambulatory Visit (INDEPENDENT_AMBULATORY_CARE_PROVIDER_SITE_OTHER): Payer: Medicare Other | Admitting: *Deleted

## 2017-08-19 ENCOUNTER — Other Ambulatory Visit (HOSPITAL_COMMUNITY): Payer: Self-pay | Admitting: Family Medicine

## 2017-08-19 ENCOUNTER — Ambulatory Visit (HOSPITAL_COMMUNITY): Payer: Medicare Other

## 2017-08-19 ENCOUNTER — Other Ambulatory Visit (HOSPITAL_COMMUNITY): Payer: Medicare Other

## 2017-08-19 ENCOUNTER — Ambulatory Visit (HOSPITAL_COMMUNITY): Payer: Medicare Other | Admitting: Physical Therapy

## 2017-08-19 DIAGNOSIS — S81802D Unspecified open wound, left lower leg, subsequent encounter: Secondary | ICD-10-CM | POA: Diagnosis not present

## 2017-08-19 DIAGNOSIS — S81801D Unspecified open wound, right lower leg, subsequent encounter: Secondary | ICD-10-CM

## 2017-08-19 DIAGNOSIS — I4892 Unspecified atrial flutter: Secondary | ICD-10-CM

## 2017-08-19 DIAGNOSIS — Z5181 Encounter for therapeutic drug level monitoring: Secondary | ICD-10-CM

## 2017-08-19 DIAGNOSIS — R262 Difficulty in walking, not elsewhere classified: Secondary | ICD-10-CM

## 2017-08-19 DIAGNOSIS — Z1231 Encounter for screening mammogram for malignant neoplasm of breast: Secondary | ICD-10-CM

## 2017-08-19 DIAGNOSIS — M79605 Pain in left leg: Secondary | ICD-10-CM

## 2017-08-19 LAB — POCT INR: INR: 2.1

## 2017-08-19 NOTE — Therapy (Signed)
[place], [place], [address] Phone: [phone]   Fax:  [phone]  Wound Care Therapy  Patient Details  Name: [name] MRN: [id number] Date of Birth: Nov 18, 1945 Referring Provider: Sharilyn Sites   Encounter Date: 08/19/2017  PT End of Session - 08/19/17 1430    Visit Number  21    Number of Visits  24    Date for PT Re-Evaluation  08/21/17    Authorization Type  [employer] medicare  cert 85/4-62/70, gcodes done visit #9    Authorization - Visit Number  21    Authorization - Number of Visits  29    PT Start Time  1350    PT Stop Time  1420    PT Time Calculation (min)  30 min    Activity Tolerance  Patient tolerated treatment well    Behavior During Therapy  [facility] for tasks assessed/performed       Past Medical History:  Diagnosis Date  . Anemia   . Asthma   . Atrial flutter [facility])    TEE/DCCV December 2013 - on Xarelto [facility])  . Bone spur of toe    Right 5th toe  . Chronic diastolic CHF (congestive heart failure) (HCC)    a. EF 50-55% by echo in 2015  . CKD (chronic kidney disease) stage 4, GFR 15-29 ml/min (HCC)    a. s/p fistula placement but not on HD. Followed by Nephrology.   . DVT of upper extremity (deep vein thrombosis) ([place])    Right basilic vein, June 3500  . Essential hypertension, benign   . SLE (systemic lupus erythematosus) ([place])   . Type 2 diabetes mellitus ([place])   . UTI (lower urinary tract infection)     Past Surgical History:  Procedure Laterality Date  . ABDOMINAL HYSTERECTOMY  1983  . ANKLE SURGERY  1993  . AV FISTULA PLACEMENT Left 03/27/2014   Procedure: ARTERIOVENOUS (AV) FISTULA CREATION;  Surgeon: Rosetta Posner, MD;  Location: [place];  Service: Vascular;  Laterality: Left;  . BACK SURGERY  1980  . CARDIOVASCULAR STRESS TEST  12/19/2009   no stress induced rhythm abnormalities, ekg negative for ischemia  . CARDIOVERSION  09/16/2012   Procedure: CARDIOVERSION;  Surgeon:  Sanda Klein, MD;  Location: [facility] ENDOSCOPY;  Service: Cardiovascular;  Laterality: N/A;  . COLONOSCOPY  09/20/2012   Dr. [name]: multiple tubular adenomas   . COLONOSCOPY  2005   Dr. [name]. Polyps, path unknown   . COLONOSCOPY N/A 07/24/2016   Procedure: COLONOSCOPY;  Surgeon: [name], MD;  [facility];  Service: Endoscopy;  Laterality: N/A;  9:00 am  . ESOPHAGOGASTRODUODENOSCOPY  09/20/2012   Dr. [name]: duodenal erosion and possible resolving ulcer at the angularis   . TEE WITHOUT CARDIOVERSION  09/16/2012   Procedure: TRANSESOPHAGEAL ECHOCARDIOGRAM (TEE);  Surgeon: Sanda Klein, MD;  Location: [facility] ENDOSCOPY;  Service: Cardiovascular;  Laterality: N/A;  . TRANSESOPHAGEAL ECHOCARDIOGRAM WITH CARDIOVERSION  09/16/2012   EF 60-65%, moderate LVH, moderate regurg of the aortic valve, LA moderately dilated    There were no vitals filed for this visit.              Wound Therapy - 08/19/17 1426    Subjective  Pt without complaints. Has appointment December 6 with Dr Oneida Alar, which is a vascular surgeon.      Pain Assessment  No/denies pain    Wound Properties Date First Assessed: 06/25/17 Time First Assessed: 1100  Wound Type: Non-pressure wound Location: Leg Location Orientation: Posterior;Medial;Right Wound Description (Comments): blister   Dressing Type  Silver hydrofiber    Dressing Changed  Changed    Dressing Status  Old drainage    Dressing Change Frequency  PRN    Site / Wound Assessment  Pale;Pink;Yellow    % Wound base Red or Granulating  10%    % Wound base Yellow/Fibrinous Exudate  90%    Peri-wound Assessment  Intact    Margins  Epibole (rolled edges)    Drainage Amount  Minimal    Treatment  Cleansed;Debridement (Selective)    Wound Properties Date First Assessed: 06/04/17 Time First Assessed: 1437 Wound Type: Non-pressure wound Location: Leg Location Orientation: Left;Lateral;Lower Present on Admission: Yes   Dressing Type  Impregnated gauze  (bismuth)    Dressing Changed  Changed    Dressing Status  Dry    Dressing Change Frequency  PRN    Site / Wound Assessment  Dry    % Wound base Red or Granulating  90%    % Wound base Yellow/Fibrinous Exudate  10%    Treatment  Cleansed;Debridement (Selective)    Selective Debridement - Location  both wounds     Selective Debridement - Tools Used  Forceps;Scalpel    Selective Debridement - Tissue Removed  slough    Wound Therapy - Clinical Statement  Lt LE wound continues to improve and approximate.  Good results with xeroform.  Rt LE with little change and less drainage.  Also changed this one to xeroform.      Wound Therapy - Functional Problem List  difficulty in walking     Factors Delaying/Impairing Wound Healing  Altered sensation;Multiple medical problems;Vascular compromise    Hydrotherapy Plan  Debridement;Dressing change;Patient/family education    Wound Plan  Continue with wound care.  Remeasure weekly.                PT Short Term Goals - 07/09/17 1337      PT SHORT TERM GOAL #1   Title  Pt to understand signs and symptoms of cellulits and the importance of contacting MD if this occurs     Time  1    Period  Weeks    Status  Achieved      PT SHORT TERM GOAL #2   Title  PT lateral wound to be 75% granulated and to have no black eschar to reduce the risk of infection.    Baseline  -- currently no black eschar; 60% granulated     Time  2    Period  Weeks    Status  On-going      PT SHORT TERM GOAL #3   Title  Pt pain to be decreased to no greater than a 4/10 to allow pt to rest better    Baseline  -- 07/09/2017:  pain is no greater than a 4/10 except when debriding.  Pt unable to walk 10 minutes due to CHF revised goal to be so that pt can rest better.     Time  3    Period  Weeks    Status  Revised        PT Long Term Goals - 07/09/17 1340      PT LONG TERM GOAL #1   Title  Pt wound to be 100% granulated to prevent infection     Time  4    Period   Weeks    Status  On-going  PT LONG TERM GOAL #2   Title  PT lateral  wound size to be no greater than 2x2 to allow pt to feel comfortable in self care of wound ; anterior wound to be healed     Time  6    Period  Weeks    Status  On-going      PT LONG TERM GOAL #3   Title  If profore felt comfortable to pt pt to be educated in compression garments and why they may be benefical for pt.  Pt has had wounds come up on her Rt leg in the past as well     Baseline  -- 07/09/2017:  Await result of ABI    Time  6    Period  Weeks    Status  Deferred              Patient will benefit from skilled therapeutic intervention in order to improve the following deficits and impairments:     Visit Diagnosis: Pain in left leg  Leg wound, left, subsequent encounter  Leg wound, right, subsequent encounter  Difficulty in walking, not elsewhere classified     Problem List Patient Active Problem List   Diagnosis Date Noted  . Iron deficiency anemia 08/07/2017  . Heme positive stool 10/04/2015  . Mixed hyperlipidemia 06/29/2015  . Type 2 diabetes mellitus with stage 4 chronic kidney disease ([place]) 06/29/2015  . Encounter for adequacy testing for hemodialysis ([place]) 04/12/2014  . Chronic kidney disease, stage V ([place]) 04/12/2014  . 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 Encounter for therapeutic drug monitoring 03/30/2014  . Chronic diastolic heart failure ([place]) 03/15/2014  . Renal failure (ARF), acute on chronic (HCC) 03/13/2014  . Anemia of chronic renal failure, stage 4 (severe) ([place]) 02/23/2014  . Folate deficiency 02/17/2014  . Aortic regurgitation 09/19/2012  . Atrial flutter ([place]) 09/14/2012  . Class 2 severe obesity due to excess calories with serious comorbidity and body mass index (BMI) of 35.0 to 35.9 in adult ([place]) 09/14/2012  . Type 2 diabetes mellitus with diabetic neuropathy ([place]) 09/14/2012  . SLE (systemic  lupus erythematosus) ([place]) 09/14/2012  . Essential hypertension, benign 09/14/2012  . COPD (chronic obstructive pulmonary disease) ([place]) 09/14/2012   Teena Irani, PTA/CLT [phone]  Teena Irani 08/19/2017, 2:32 PM  [place] [address] [place], [place], [address] Phone: [phone]   Fax:  [phone]  Name: [name] MRN: [id number] Date of Birth: Jan 31, 1946

## 2017-08-24 ENCOUNTER — Encounter (HOSPITAL_BASED_OUTPATIENT_CLINIC_OR_DEPARTMENT_OTHER): Payer: Medicare Other

## 2017-08-24 ENCOUNTER — Encounter (HOSPITAL_COMMUNITY): Payer: Self-pay

## 2017-08-24 VITALS — BP 153/40 | HR 68 | Temp 97.8°F | Resp 16

## 2017-08-24 DIAGNOSIS — N184 Chronic kidney disease, stage 4 (severe): Principal | ICD-10-CM

## 2017-08-24 DIAGNOSIS — D509 Iron deficiency anemia, unspecified: Secondary | ICD-10-CM

## 2017-08-24 DIAGNOSIS — D631 Anemia in chronic kidney disease: Secondary | ICD-10-CM

## 2017-08-24 MED ORDER — SODIUM CHLORIDE 0.9 % IV SOLN
Freq: Once | INTRAVENOUS | Status: AC
  Administered 2017-08-24: 14:00:00 via INTRAVENOUS

## 2017-08-24 MED ORDER — SODIUM CHLORIDE 0.9% FLUSH
3.0000 mL | Freq: Once | INTRAVENOUS | Status: AC | PRN
  Administered 2017-08-24: 3 mL via INTRAVENOUS

## 2017-08-24 MED ORDER — SODIUM CHLORIDE 0.9 % IV SOLN
510.0000 mg | Freq: Once | INTRAVENOUS | Status: AC
  Administered 2017-08-24: 510 mg via INTRAVENOUS
  Filled 2017-08-24: qty 17

## 2017-08-24 NOTE — Patient Instructions (Signed)
[facility]  Discharge Instructions:  You received feraheme today.  _______________________________________________________________  Thank you for choosing [facility] to provide your oncology and hematology care.  To afford each patient quality time with our providers, please arrive at least 15 minutes before your scheduled appointment.  You need to re-schedule your appointment if you arrive 10 or more minutes late.  We strive to give you quality time with our providers, and arriving late affects you and other patients whose appointments are after yours.  Also, if you no show three or more times for appointments you may be dismissed from the clinic.  Again, thank you for choosing [facility]. Our hope is that these requests will allow you access to exceptional care and in a timely manner. _______________________________________________________________  If you have questions after your visit, please contact our office at [phone] between the hours of 8:30 a.m. and 5:00 p.m. Voicemails left after 4:30 p.m. will not be returned until the following business day. _______________________________________________________________  For prescription refill requests, have your pharmacy contact our office. _______________________________________________________________  Recommendations made by the consultant and any test results will be sent to your referring physician. _______________________________________________________________ 

## 2017-08-24 NOTE — Progress Notes (Signed)
To treatment area for feraheme.  Complains of chronic back pain and uses tylenol at home for relief.  Rates today 4 on pain scale.    Patient tolerated feraheme with no complaints voiced.  Good blood return noted before and after administration of iron.  Band aid applied.  VSS with discharge and left via wheelchair with family.  No s/s of distress noted.

## 2017-08-25 ENCOUNTER — Encounter (HOSPITAL_COMMUNITY): Payer: Self-pay | Admitting: Physical Therapy

## 2017-08-25 ENCOUNTER — Other Ambulatory Visit: Payer: Self-pay

## 2017-08-25 ENCOUNTER — Ambulatory Visit (HOSPITAL_COMMUNITY): Payer: Medicare Other | Admitting: Physical Therapy

## 2017-08-25 DIAGNOSIS — S81802D Unspecified open wound, left lower leg, subsequent encounter: Secondary | ICD-10-CM | POA: Diagnosis not present

## 2017-08-25 DIAGNOSIS — R262 Difficulty in walking, not elsewhere classified: Secondary | ICD-10-CM

## 2017-08-25 DIAGNOSIS — S81801D Unspecified open wound, right lower leg, subsequent encounter: Secondary | ICD-10-CM

## 2017-08-25 DIAGNOSIS — M79605 Pain in left leg: Secondary | ICD-10-CM | POA: Diagnosis not present

## 2017-08-25 NOTE — Therapy (Signed)
[place], [place], [address] Phone: [phone]   Fax:  [phone]  Wound Care Therapy  Patient Details  Name: [name] MRN: [id number] Date of Birth: 1946-04-14 Referring Provider: Sharilyn Sites   Encounter Date: 08/25/2017  PT End of Session - 08/25/17 1152    Visit Number  22    Number of Visits  24    Date for PT Re-Evaluation  08/21/17    Authorization Type  [employer] medicare  cert [id number]/0-63/01, gcodes done visit #9    Authorization - Visit Number  69    Authorization - Number of Visits  29    PT Start Time  1033    PT Stop Time  1115    PT Time Calculation (min)  42 min    Activity Tolerance  Patient tolerated treatment well    Behavior During Therapy  [facility] for tasks assessed/performed       Past Medical History:  Diagnosis Date  . Anemia   . Asthma   . Atrial flutter [facility])    TEE/DCCV December 2013 - on Xarelto [facility])  . Bone spur of toe    Right 5th toe  . Chronic diastolic CHF (congestive heart failure) (HCC)    a. EF 50-55% by echo in 2015  . CKD (chronic kidney disease) stage 4, GFR 15-29 ml/min (HCC)    a. s/p fistula placement but not on HD. Followed by Nephrology.   . DVT of upper extremity (deep vein thrombosis) ([place])    Right basilic vein, June 6010  . Essential hypertension, benign   . SLE (systemic lupus erythematosus) ([place])   . Type 2 diabetes mellitus ([place])   . UTI (lower urinary tract infection)     Past Surgical History:  Procedure Laterality Date  . ABDOMINAL HYSTERECTOMY  1983  . ANKLE SURGERY  1993  . AV FISTULA PLACEMENT Left 03/27/2014   Procedure: ARTERIOVENOUS (AV) FISTULA CREATION;  Surgeon: Rosetta Posner, MD;  Location: [place];  Service: Vascular;  Laterality: Left;  . BACK SURGERY  1980  . CARDIOVASCULAR STRESS TEST  12/19/2009   no stress induced rhythm abnormalities, ekg negative for ischemia  . CARDIOVERSION  09/16/2012   Procedure: CARDIOVERSION;  Surgeon:  Sanda Klein, MD;  Location: [facility] ENDOSCOPY;  Service: Cardiovascular;  Laterality: N/A;  . COLONOSCOPY  09/20/2012   Dr. [name]: multiple tubular adenomas   . COLONOSCOPY  2005   Dr. [name]. Polyps, path unknown   . COLONOSCOPY N/A 07/24/2016   Procedure: COLONOSCOPY;  Surgeon: [name], MD;  [facility];  Service: Endoscopy;  Laterality: N/A;  9:00 am  . ESOPHAGOGASTRODUODENOSCOPY  09/20/2012   Dr. [name]: duodenal erosion and possible resolving ulcer at the angularis   . TEE WITHOUT CARDIOVERSION  09/16/2012   Procedure: TRANSESOPHAGEAL ECHOCARDIOGRAM (TEE);  Surgeon: Sanda Klein, MD;  Location: [facility] ENDOSCOPY;  Service: Cardiovascular;  Laterality: N/A;  . TRANSESOPHAGEAL ECHOCARDIOGRAM WITH CARDIOVERSION  09/16/2012   EF 60-65%, moderate LVH, moderate regurg of the aortic valve, LA moderately dilated    There were no vitals filed for this visit.   Wound Therapy - 08/25/17 1137    Subjective  Patient with no complaints today, feeling well overall. She will be seeing her vascular surgeon next week to determine next step for treatment following her ABI test results.    Patient and Family Stated Goals  wounds to heal     Pain Assessment  No/denies pain  Wound Properties Date First Assessed: 06/25/17 Time First Assessed: 1100 Wound Type: Non-pressure wound Location: Leg Location Orientation: Posterior;Medial;Right Wound Description (Comments): blister   Dressing Type  Silver hydrofiber;Gauze (Comment)    Dressing Changed  Changed    Dressing Status  Old drainage    Dressing Change Frequency  PRN    Site / Wound Assessment  Pink;Yellow    % Wound base Red or Granulating  20%    % Wound base Yellow/Fibrinous Exudate  80%    Peri-wound Assessment  Intact    Wound Length (cm)  1 cm 1.0 (08/17/17)    Wound Width (cm)  1.2 cm 1.5 (08/17/17)    Wound Depth (cm)  0.3 cm 0.3 (08/17/17)    Wound Volume (cm^3)  0.36 cm^3    Wound Surface Area (cm^2)  1.2 cm^2    Margins   Epibole (rolled edges)    Drainage Amount  Minimal    Treatment  Cleansed;Debridement (Selective)    Wound Properties Date First Assessed: 06/04/17 Time First Assessed: 8127 Wound Type: Non-pressure wound Location: Leg Location Orientation: Left;Lateral;Lower Present on Admission: Yes   Dressing Type  Silver hydrofiber;Gauze (Comment)    Dressing Changed  Changed    Dressing Status  Dry    Dressing Change Frequency  PRN    Site / Wound Assessment  Dry    % Wound base Red or Granulating  90%    % Wound base Yellow/Fibrinous Exudate  10%    Peri-wound Assessment  Intact    Wound Length (cm)  1 cm 1.5 (08/17/17)    Wound Width (cm)  1.6 cm 2.3 (08/17/17)    Wound Depth (cm)  0.1 cm 0.1 (08/17/17)    Wound Volume (cm^3)  0.16 cm^3    Wound Surface Area (cm^2)  1.6 cm^2    Treatment  Cleansed;Debridement (Selective)    Selective Debridement - Location  both wounds     Selective Debridement - Tools Used  Forceps;Scalpel    Selective Debridement - Tissue Removed  slough    Wound Therapy - Clinical Statement  Remeasured wounds this session with notable decrease in wound size for both LE with  improved granulation tissue on RLE wound. Both wounds remain dry with minimal drainage; continued with xeroform dressing as patient has had good results.    Wound Therapy - Functional Problem List  difficulty in walking     Factors Delaying/Impairing Wound Healing  Altered sensation;Multiple medical problems;Vascular compromise    Hydrotherapy Plan  Debridement;Dressing change;Patient/family education    Wound Plan  Continue with wound care.  Remeasure weekly.        PT Short Term Goals - 07/09/17 1337      PT SHORT TERM GOAL #1   Title  Pt to understand signs and symptoms of cellulits and the importance of contacting MD if this occurs     Time  1    Period  Weeks    Status  Achieved      PT SHORT TERM GOAL #2   Title  PT lateral wound to be 75% granulated and to have no black eschar to reduce  the risk of infection.    Baseline  -- currently no black eschar; 60% granulated     Time  2    Period  Weeks    Status  On-going      PT SHORT TERM GOAL #3   Title  Pt pain to be decreased to no greater than a 4/10 to  allow pt to rest better    Baseline  -- 07/09/2017:  pain is no greater than a 4/10 except when debriding.  Pt unable to walk 10 minutes due to CHF revised goal to be so that pt can rest better.     Time  3    Period  Weeks    Status  Revised        PT Long Term Goals - 07/09/17 1340      PT LONG TERM GOAL #1   Title  Pt wound to be 100% granulated to prevent infection     Time  4    Period  Weeks    Status  On-going      PT LONG TERM GOAL #2   Title  PT lateral  wound size to be no greater than 2x2 to allow pt to feel comfortable in self care of wound ; anterior wound to be healed     Time  6    Period  Weeks    Status  On-going      PT LONG TERM GOAL #3   Title  If profore felt comfortable to pt pt to be educated in compression garments and why they may be benefical for pt.  Pt has had wounds come up on her Rt leg in the past as well     Baseline  -- 07/09/2017:  Await result of ABI    Time  6    Period  Weeks    Status  Deferred        Plan - 08/25/17 1154    Clinical Impression Statement  see above    Rehab Potential  Good    PT Frequency  2x / week    PT Duration  12 weeks    PT Treatment/Interventions  Other (comment) debridement and dressing change     PT Next Visit Plan  continue to remeasure wounds on Tuesdays.     Consulted and Agree with Plan of Care  Patient       Patient will benefit from skilled therapeutic intervention in order to improve the following deficits and impairments:  Decreased activity tolerance, Increased edema, Difficulty walking, Pain  Visit Diagnosis: Pain in left leg  Leg wound, left, subsequent encounter  Leg wound, right, subsequent encounter  Difficulty in walking, not elsewhere classified     Problem  List Patient Active Problem List   Diagnosis Date Noted  . Iron deficiency anemia 08/07/2017  . Heme positive stool 10/04/2015  . Mixed hyperlipidemia 06/29/2015  . Type 2 diabetes mellitus with stage 4 chronic kidney disease ([place]) 06/29/2015  . Encounter for adequacy testing for hemodialysis ([place]) 04/12/2014  . Chronic kidney disease, stage V ([place]) 04/12/2014  . 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 Encounter for therapeutic drug monitoring 03/30/2014  . Chronic diastolic heart failure ([place]) 03/15/2014  . Renal failure (ARF), acute on chronic (HCC) 03/13/2014  . Anemia of chronic renal failure, stage 4 (severe) ([place]) 02/23/2014  . Folate deficiency 02/17/2014  . Aortic regurgitation 09/19/2012  . Atrial flutter ([place]) 09/14/2012  . Class 2 severe obesity due to excess calories with serious comorbidity and body mass index (BMI) of 35.0 to 35.9 in adult ([place]) 09/14/2012  . Type 2 diabetes mellitus with diabetic neuropathy ([place]) 09/14/2012  . SLE (systemic lupus erythematosus) ([place]) 09/14/2012  . Essential hypertension, benign 09/14/2012  . COPD (chronic obstructive pulmonary disease) ([place]) 09/14/2012    Debara Pickett, PT, DPT  Physical Therapist with [facility]  08/25/2017 11:57 AM    [place] [address] [place], [place], [address] Phone: [phone]   Fax:  [phone]  Name: [name] MRN: [id number] Date of Birth: 10/08/1945

## 2017-08-26 ENCOUNTER — Encounter (HOSPITAL_COMMUNITY): Payer: Self-pay | Admitting: Oncology

## 2017-08-26 ENCOUNTER — Encounter (HOSPITAL_BASED_OUTPATIENT_CLINIC_OR_DEPARTMENT_OTHER): Payer: Medicare Other | Admitting: Oncology

## 2017-08-26 ENCOUNTER — Encounter (HOSPITAL_COMMUNITY): Payer: Medicare Other

## 2017-08-26 ENCOUNTER — Other Ambulatory Visit: Payer: Self-pay

## 2017-08-26 ENCOUNTER — Encounter (HOSPITAL_BASED_OUTPATIENT_CLINIC_OR_DEPARTMENT_OTHER): Payer: Medicare Other

## 2017-08-26 ENCOUNTER — Encounter (HOSPITAL_COMMUNITY): Payer: Self-pay

## 2017-08-26 VITALS — BP 132/49 | HR 62 | Temp 97.5°F | Resp 18 | Ht 65.0 in | Wt 204.0 lb

## 2017-08-26 DIAGNOSIS — I5032 Chronic diastolic (congestive) heart failure: Secondary | ICD-10-CM | POA: Diagnosis not present

## 2017-08-26 DIAGNOSIS — N184 Chronic kidney disease, stage 4 (severe): Secondary | ICD-10-CM

## 2017-08-26 DIAGNOSIS — I4892 Unspecified atrial flutter: Secondary | ICD-10-CM | POA: Diagnosis not present

## 2017-08-26 DIAGNOSIS — Z79899 Other long term (current) drug therapy: Secondary | ICD-10-CM | POA: Diagnosis not present

## 2017-08-26 DIAGNOSIS — D631 Anemia in chronic kidney disease: Secondary | ICD-10-CM

## 2017-08-26 DIAGNOSIS — D508 Other iron deficiency anemias: Secondary | ICD-10-CM

## 2017-08-26 DIAGNOSIS — J449 Chronic obstructive pulmonary disease, unspecified: Secondary | ICD-10-CM | POA: Diagnosis not present

## 2017-08-26 DIAGNOSIS — N058 Unspecified nephritic syndrome with other morphologic changes: Secondary | ICD-10-CM | POA: Diagnosis not present

## 2017-08-26 DIAGNOSIS — Z5181 Encounter for therapeutic drug level monitoring: Secondary | ICD-10-CM | POA: Diagnosis not present

## 2017-08-26 DIAGNOSIS — Z86718 Personal history of other venous thrombosis and embolism: Secondary | ICD-10-CM | POA: Diagnosis not present

## 2017-08-26 DIAGNOSIS — D509 Iron deficiency anemia, unspecified: Secondary | ICD-10-CM | POA: Diagnosis not present

## 2017-08-26 DIAGNOSIS — Z87891 Personal history of nicotine dependence: Secondary | ICD-10-CM | POA: Diagnosis not present

## 2017-08-26 DIAGNOSIS — E782 Mixed hyperlipidemia: Secondary | ICD-10-CM | POA: Diagnosis not present

## 2017-08-26 DIAGNOSIS — E538 Deficiency of other specified B group vitamins: Secondary | ICD-10-CM | POA: Diagnosis not present

## 2017-08-26 DIAGNOSIS — E1122 Type 2 diabetes mellitus with diabetic chronic kidney disease: Secondary | ICD-10-CM | POA: Diagnosis not present

## 2017-08-26 DIAGNOSIS — M329 Systemic lupus erythematosus, unspecified: Secondary | ICD-10-CM | POA: Diagnosis not present

## 2017-08-26 DIAGNOSIS — I132 Hypertensive heart and chronic kidney disease with heart failure and with stage 5 chronic kidney disease, or end stage renal disease: Secondary | ICD-10-CM | POA: Diagnosis not present

## 2017-08-26 LAB — CBC WITH DIFFERENTIAL/PLATELET
BASOS ABS: 0 10*3/uL (ref 0.0–0.1)
BASOS PCT: 0 %
EOS ABS: 0.1 10*3/uL (ref 0.0–0.7)
EOS PCT: 2 %
HCT: 31.1 % — ABNORMAL LOW (ref 36.0–46.0)
HEMOGLOBIN: 9.1 g/dL — AB (ref 12.0–15.0)
Lymphocytes Relative: 16 %
Lymphs Abs: 0.8 10*3/uL (ref 0.7–4.0)
MCH: 25.6 pg — AB (ref 26.0–34.0)
MCHC: 29.3 g/dL — ABNORMAL LOW (ref 30.0–36.0)
MCV: 87.6 fL (ref 78.0–100.0)
Monocytes Absolute: 0.4 10*3/uL (ref 0.1–1.0)
Monocytes Relative: 9 %
NEUTROS PCT: 73 %
Neutro Abs: 3.6 10*3/uL (ref 1.7–7.7)
PLATELETS: 254 10*3/uL (ref 150–400)
RBC: 3.55 MIL/uL — AB (ref 3.87–5.11)
RDW: 19.5 % — ABNORMAL HIGH (ref 11.5–15.5)
WBC: 4.9 10*3/uL (ref 4.0–10.5)

## 2017-08-26 MED ORDER — DARBEPOETIN ALFA 500 MCG/ML IJ SOSY
PREFILLED_SYRINGE | INTRAMUSCULAR | Status: AC
  Filled 2017-08-26: qty 1

## 2017-08-26 MED ORDER — DARBEPOETIN ALFA 500 MCG/ML IJ SOSY
500.0000 ug | PREFILLED_SYRINGE | Freq: Once | INTRAMUSCULAR | Status: AC
  Administered 2017-08-26: 500 ug via SUBCUTANEOUS

## 2017-08-26 NOTE — Patient Instructions (Signed)
[place] at [facility] Discharge Instructions  RECOMMENDATIONS MADE BY THE CONSULTANT AND ANY TEST RESULTS WILL BE SENT TO YOUR REFERRING PHYSICIAN.  You had your aranesp injection today   Thank you for choosing [place] at [facility] to provide your oncology and hematology care.  To afford each patient quality time with our provider, please arrive at least 15 minutes before your scheduled appointment time.    If you have a lab appointment with the [facility] please come in thru the  Main Entrance and check in at the main information desk  You need to re-schedule your appointment should you arrive 10 or more minutes late.  We strive to give you quality time with our providers, and arriving late affects you and other patients whose appointments are after yours.  Also, if you no show three or more times for appointments you may be dismissed from the clinic at the providers discretion.     Again, thank you for choosing [facility].  Our hope is that these requests will decrease the amount of time that you wait before being seen by our physicians.       _____________________________________________________________  Should you have questions after your visit to [facility], please contact our office at [phone] between the hours of 8:30 a.m. and 4:30 p.m.  Voicemails left after 4:30 p.m. will not be returned until the following business day.  For prescription refill requests, have your pharmacy contact our office.       Resources For Cancer Patients and their Caregivers ? American Cancer Society: Can assist with transportation, wigs, general needs, runs Look Good Feel Better.        (843) 545-3542 ? Cancer Care: Provides financial assistance, online support groups, medication/co-pay assistance.  1-800-813-HOPE 570-053-4465) ? [place] Assists [place] cancer patients and their  families through emotional , educational and financial support.  603-407-3372 ? [place] DSS Where to apply for food stamps, Medicaid and utility assistance. 938-537-0849 ? [employer]: Transportation to medical appointments. (417)767-7281 ? Social Security Administration: May apply for disability if have a Stage IV cancer. 740-401-9998 443-631-2581 ? [employer], Disability and Transit Services: Assists with nutrition, care and transit needs. [place] Support Programs: @10RELATIVEDAYS @ > Cancer Support Group  2nd Tuesday of the month 1pm-2pm, Journey Room  > Creative Journey  3rd Tuesday of the month 1130am-1pm, Journey Room  > Look Good Feel Better  1st Wednesday of the month 10am-12 noon, Journey Room (Call [facility] to register [phone])

## 2017-08-26 NOTE — Progress Notes (Signed)
Sharilyn Sites, MD [place] [address]    DIAGNOSIS:   Anemia Folic acid deficiency CKD Focal segmental glomerulosclerosis without nephrosis or chronic glomerulonephritis SLE Coumadin, history of UE DVT, a-flutter     CURRENT THERAPY:  Aranesp IV iron Oncology Flowsheet 08/12/2017 08/24/2017  ferumoxytol (FERAHEME) IV 510 mg 510 mg    INTERVAL HISTORY: [name] 71 y.o. female returns for follow-up of anemia. She has lupus and FSG. She also has a history of folic acid deficiency. She has ongoing Coumadin use because of a history of an upper extremity DVT and atrial flutter. Coumadin is currently being continued for stroke prophylaxis. She has a fistula placed in preparation for dialysis.  Patient states that overall she has been doing well. She has been having some right hip pain which started 2 weeks ago, and she has an appointment to go see her PCP. She received 2 doses of feraheme recently and states she feels better after getting it. She denies any bleeding. She denies any flares of her lupus. Denies chest pain, SOB, abdominal pain, leg swelling, loss of appetite, or any other concerns.   MEDICAL HISTORY: Past Medical History:  Diagnosis Date  . Anemia   . Asthma   . Atrial flutter [facility])    TEE/DCCV December 2013 - on Xarelto [facility])  . Bone spur of toe    Right 5th toe  . Chronic diastolic CHF (congestive heart failure) (HCC)    a. EF 50-55% by echo in 2015  . CKD (chronic kidney disease) stage 4, GFR 15-29 ml/min (HCC)    a. s/p fistula placement but not on HD. Followed by Nephrology.   . DVT of upper extremity (deep vein thrombosis) ([place])    Right basilic vein, June 2536  . Essential hypertension, benign   . SLE (systemic lupus erythematosus) ([place])   . Type 2 diabetes mellitus ([place])   . UTI (lower urinary tract infection)     has Atrial flutter ([place]); Class 2 severe obesity due to excess calories with serious comorbidity and  body mass index (BMI) of 35.0 to 35.9 in adult [facility]); Type 2 diabetes mellitus with diabetic neuropathy (HCC); SLE (systemic lupus erythematosus) ([place]); Essential hypertension, benign; COPD (chronic obstructive pulmonary disease) ([place]); Aortic regurgitation; Folate deficiency; Anemia of chronic renal failure, stage 4 (severe) ([place]); Renal failure (ARF), acute on chronic ([place]); Chronic diastolic heart failure ([place]); Encounter for therapeutic drug monitoring; Focal segmental glomerulosclerosis without nephrosis or chronic glomerulonephritis; Encounter for adequacy testing for hemodialysis ([place]); Chronic kidney disease, stage V ([place]); Mixed hyperlipidemia; Type 2 diabetes mellitus with stage 4 chronic kidney disease ([place]); Heme positive stool; and Iron deficiency anemia on their problem list.     is allergic to ace inhibitors.  Ms. Freiberger had no medications administered during this visit.  SURGICAL HISTORY: Past Surgical History:  Procedure Laterality Date  . ABDOMINAL HYSTERECTOMY  1983  . ANKLE SURGERY  1993  . AV FISTULA PLACEMENT Left 03/27/2014   Procedure: ARTERIOVENOUS (AV) FISTULA CREATION;  Surgeon: Rosetta Posner, MD;  Location: [facility];  Service: Vascular;  Laterality: Left;  . BACK SURGERY  1980  . CARDIOVASCULAR STRESS TEST  12/19/2009   no stress induced rhythm abnormalities, ekg negative for ischemia  . CARDIOVERSION  09/16/2012   Procedure: CARDIOVERSION;  Surgeon: Sanda Klein, MD;  Location: [facility] ENDOSCOPY;  Service: Cardiovascular;  Laterality: N/A;  . COLONOSCOPY  09/20/2012   Dr. [name]: multiple tubular adenomas   . COLONOSCOPY  2005   Dr. [name]. Polyps, path unknown   . COLONOSCOPY N/A 07/24/2016   Procedure: COLONOSCOPY;  Surgeon: [name], MD;  [facility];  Service: Endoscopy;  Laterality: N/A;  9:00 am  . ESOPHAGOGASTRODUODENOSCOPY  09/20/2012   Dr. [name]: duodenal erosion and possible resolving ulcer at the angularis   . TEE WITHOUT CARDIOVERSION   09/16/2012   Procedure: TRANSESOPHAGEAL ECHOCARDIOGRAM (TEE);  Surgeon: Sanda Klein, MD;  Location: [facility] ENDOSCOPY;  Service: Cardiovascular;  Laterality: N/A;  . TRANSESOPHAGEAL ECHOCARDIOGRAM WITH CARDIOVERSION  09/16/2012   EF 60-65%, moderate LVH, moderate regurg of the aortic valve, LA moderately dilated    SOCIAL HISTORY: Social History   Socioeconomic History  . Marital status: Married    Spouse name: Not on file  . Number of children: Not on file  . Years of education: Not on file  . Highest education level: Not on file  Social Needs  . Financial resource strain: Not on file  . Food insecurity - worry: Not on file  . Food insecurity - inability: Not on file  . Transportation needs - medical: Not on file  . Transportation needs - non-medical: Not on file  Occupational History  . Not on file  Tobacco Use  . Smoking status: Former Smoker    Packs/day: 1.00    Years: 30.00    Pack years: 30.00    Types: Cigarettes    Last attempt to quit: 08/29/2012    Years since quitting: 4.9  . Smokeless tobacco: Never Used  . Tobacco comment: quit 08-2012  Substance and Sexual Activity  . Alcohol use: No    Alcohol/week: 0.0 oz  . Drug use: No  . Sexual activity: Yes    Partners: Male  Other Topics Concern  . Not on file  Social History Narrative  . Not on file    FAMILY HISTORY: Family History  Problem Relation Age of Onset  . Diabetes Brother   . Diabetes Father   . Hypertension Father   . Hypertension Sister   . Stroke Mother   . Diabetes Sister   . Hypertension Brother   . Colon cancer Neg Hx    Review of Systems  Constitutional: Positive for malaise/fatigue.       No loss of appetite   HENT: Negative.   Eyes: Negative.   Respiratory: Negative.  Negative for shortness of breath.   Cardiovascular: Negative.  Negative for chest pain and leg swelling.  Gastrointestinal: Negative.  Negative for abdominal pain.  Genitourinary: Negative.   Musculoskeletal:  Negative for joint pain and myalgias.  Skin: Negative.   Neurological: Negative.   Endo/Heme/Allergies: Negative.   Psychiatric/Behavioral: Negative.   All other systems reviewed and are negative. 14 point review of systems was performed and is negative except as detailed under history of present illness and above  PHYSICAL EXAMINATION ECOG PERFORMANCE STATUS: 1 - Symptomatic but completely ambulatory  Vitals:   08/26/17 1112  BP: (!) 132/49  Pulse: 62  Resp: 18  Temp: (!) 97.5 F (36.4 C)   Physical Exam  Constitutional: She is oriented to person, place, and time and well-developed, well-nourished, and in no distress.  HENT:  Head: Normocephalic and atraumatic.  Mouth/Throat: Oropharynx is clear and moist.  Dentures top and bottom  Eyes: Conjunctivae and EOM are normal. Pupils are equal, round, and reactive to light.  Neck: Normal range of motion. Neck supple.  Cardiovascular: Normal rate, regular rhythm and normal heart sounds.  Pulmonary/Chest: Effort normal  and breath sounds normal.  Abdominal: Soft. Bowel sounds are normal.  Musculoskeletal: Normal range of motion.  Neurological: She is alert and oriented to person, place, and time. Gait normal.  Skin: Skin is warm and dry.  Nursing note and vitals reviewed.  LABORATORY DATA: I have reviewed the data as listed. CBC CBC Latest Ref Rng & Units 08/26/2017 08/13/2017 08/05/2017  WBC 4.0 - 10.5 K/uL 4.9 5.5 7.3  Hemoglobin 12.0 - 15.0 g/dL 9.1(L) 9.6(L) 8.4(L)  Hematocrit 36.0 - 46.0 % 31.1(L) 32.5(L) 28.3(L)  Platelets 150 - 400 K/uL 254 260 299   CMP     Component Value Date/Time   NA 139 08/13/2017 1021   NA 145 (H) 07/17/2017 1025   K 3.2 (L) 08/13/2017 1021   CL 105 08/13/2017 1021   CO2 24 08/13/2017 1021   GLUCOSE 83 08/13/2017 1331   BUN 61 (H) 08/13/2017 1021   BUN 66 (H) 07/17/2017 1025   CREATININE 2.28 (H) 08/13/2017 1021   CALCIUM 8.9 08/13/2017 1021   CALCIUM 7.0 (L) 03/16/2014 0835   PROT 6.9  08/13/2017 1021   PROT 6.6 04/16/2017 1208   ALBUMIN 3.1 (L) 08/13/2017 1021   ALBUMIN 3.3 (L) 07/17/2017 1025   AST 25 08/13/2017 1021   ALT 22 08/13/2017 1021   ALKPHOS 81 08/13/2017 1021   BILITOT 0.9 08/13/2017 1021   BILITOT 0.3 04/16/2017 1208   GFRNONAA 20 (L) 08/13/2017 1021   GFRAA 24 (L) 08/13/2017 1021    ASSESSMENT and THERAPY PLAN:   Anemia Iron deficiency Lupus Stage IV Kidney disease, FSG History of heme positive stool   71 y.o. female with lupus and kidney disease, FSG. She has underlying anemia which is certainly multi factorial including folic acid deficiency, her chronic kidney disease, and anemia of chronic disease from her lupus.   PLAN: Hemoglobin is 9.1 g/dL today despite recent replacement of iron and weekly aranesp 336m, therefore I will increase her aranesp to 5061m q weekly. Goal is to get her hemoglobin close to 11 g/dL.  If she does not have a good response to this increased dose, may consider bone marrow biopsy to rule out other causes of anemia.  Close follow up in 2 months with labs.  Orders Placed This Encounter  Procedures  . CBC with Differential    Standing Status:   Future    Standing Expiration Date:   08/26/2018  . Comprehensive metabolic panel    Standing Status:   Future    Standing Expiration Date:   08/26/2018  . Ferritin    Standing Status:   Future    Standing Expiration Date:   08/26/2018  . Iron and TIBC    Standing Status:   Future    Standing Expiration Date:   08/26/2018  . Vitamin B12    Standing Status:   Future    Standing Expiration Date:   08/26/2018  . Folate    Standing Status:   Future    Standing Expiration Date:   08/26/2018  . Reticulocytes    Standing Status:   Future    Standing Expiration Date:   08/26/2018  . Erythropoietin    Standing Status:   Future    Standing Expiration Date:   08/26/2018     All questions were answered. The patient knows to call the clinic with any problems, questions  or concerns. We can certainly see the patient much sooner if necessary. This note was electronically signed.  I have reviewed the above  documentation for accuracy and completeness, and I agree with the above. Twana First, MD

## 2017-08-26 NOTE — Progress Notes (Signed)
[name] presents today for injection per MD orders. Aranesp 500 mcg administered SQ in right lower abdomen. Administration without incident. Patient tolerated well. Patient discharged in stable condition via wheelchair with family members. Patient to follow up as scheduled.

## 2017-08-27 ENCOUNTER — Ambulatory Visit (HOSPITAL_COMMUNITY): Payer: Medicare Other | Admitting: Physical Therapy

## 2017-08-27 ENCOUNTER — Encounter (HOSPITAL_COMMUNITY): Payer: Self-pay | Admitting: Physical Therapy

## 2017-08-27 ENCOUNTER — Other Ambulatory Visit: Payer: Self-pay

## 2017-08-27 DIAGNOSIS — S81802D Unspecified open wound, left lower leg, subsequent encounter: Secondary | ICD-10-CM | POA: Diagnosis not present

## 2017-08-27 DIAGNOSIS — R262 Difficulty in walking, not elsewhere classified: Secondary | ICD-10-CM

## 2017-08-27 DIAGNOSIS — M79605 Pain in left leg: Secondary | ICD-10-CM

## 2017-08-27 DIAGNOSIS — S81801D Unspecified open wound, right lower leg, subsequent encounter: Secondary | ICD-10-CM

## 2017-08-27 NOTE — Therapy (Signed)
[place], [place], [address] Phone: [phone]   Fax:  [phone]  Wound Care Therapy  Patient Details  Name: [name] MRN: [id number] Date of Birth: 12-07-1945 Referring Provider: Sharilyn Sites   Encounter Date: 08/27/2017  PT End of Session - 08/27/17 1318    Visit Number  23    Number of Visits  24    Date for PT Re-Evaluation  08/21/17    Authorization Type  [employer] medicare  cert [id number], gcodes done visit #9    Authorization - Visit Number  23    Authorization - Number of Visits  29    PT Start Time  1030    PT Stop Time  1104    PT Time Calculation (min)  34 min    Activity Tolerance  Patient tolerated treatment well    Behavior During Therapy  [facility] for tasks assessed/performed       Past Medical History:  Diagnosis Date  . Anemia   . Asthma   . Atrial flutter [facility])    TEE/DCCV December 2013 - on Xarelto [facility])  . Bone spur of toe    Right 5th toe  . Chronic diastolic CHF (congestive heart failure) (HCC)    a. EF 50-55% by echo in 2015  . CKD (chronic kidney disease) stage 4, GFR 15-29 ml/min (HCC)    a. s/p fistula placement but not on HD. Followed by Nephrology.   . DVT of upper extremity (deep vein thrombosis) ([place])    Right basilic vein, June 4193  . Essential hypertension, benign   . SLE (systemic lupus erythematosus) ([place])   . Type 2 diabetes mellitus ([place])   . UTI (lower urinary tract infection)     Past Surgical History:  Procedure Laterality Date  . ABDOMINAL HYSTERECTOMY  1983  . ANKLE SURGERY  1993  . AV FISTULA PLACEMENT Left 03/27/2014   Procedure: ARTERIOVENOUS (AV) FISTULA CREATION;  Surgeon: Rosetta Posner, MD;  Location: [place];  Service: Vascular;  Laterality: Left;  . BACK SURGERY  1980  . CARDIOVASCULAR STRESS TEST  12/19/2009   no stress induced rhythm abnormalities, ekg negative for ischemia  . CARDIOVERSION  09/16/2012   Procedure: CARDIOVERSION;  Surgeon:  Sanda Klein, MD;  Location: [facility] ENDOSCOPY;  Service: Cardiovascular;  Laterality: N/A;  . COLONOSCOPY  09/20/2012   Dr. [name]: multiple tubular adenomas   . COLONOSCOPY  2005   Dr. [name]. Polyps, path unknown   . COLONOSCOPY N/A 07/24/2016   Procedure: COLONOSCOPY;  Surgeon: [name], MD;  [facility] ENDO [facility];  Service: Endoscopy;  Laterality: N/A;  9:00 am  . ESOPHAGOGASTRODUODENOSCOPY  09/20/2012   Dr. [name]: duodenal erosion and possible resolving ulcer at the angularis   . TEE WITHOUT CARDIOVERSION  09/16/2012   Procedure: TRANSESOPHAGEAL ECHOCARDIOGRAM (TEE);  Surgeon: Sanda Klein, MD;  Location: [facility] ENDOSCOPY;  Service: Cardiovascular;  Laterality: N/A;  . TRANSESOPHAGEAL ECHOCARDIOGRAM WITH CARDIOVERSION  09/16/2012   EF 60-65%, moderate LVH, moderate regurg of the aortic valve, LA moderately dilated    There were no vitals filed for this visit.   Wound Therapy - 08/27/17 1303    Subjective  Patient states she is having some hip pain. She's feeling good overall, plans to go home and fold some laundry and make soup for dinner. Patient states she is glad her wounds are improving.    Patient and Family Stated Goals  wounds to heal  Pain Assessment  Faces    Faces Pain Scale  Hurts a little bit    Pain Location  Hip    Pain Descriptors / Indicators  Aching    Pain Intervention(s)  Rest    Multiple Pain Sites  No    Wound Properties Date First Assessed: 06/25/17 Time First Assessed: 1100 Wound Type: Non-pressure wound Location: Leg Location Orientation: Posterior;Medial;Right Wound Description (Comments): blister   Dressing Type  Silver hydrofiber;Gauze (Comment)    Dressing Changed  Changed    Dressing Status  Old drainage    Dressing Change Frequency  PRN    Site / Wound Assessment  Pink;Yellow    % Wound base Red or Granulating  30%    % Wound base Yellow/Fibrinous Exudate  70%    Peri-wound Assessment  Intact    Wound Length (cm)  1 cm measured 08/25/17     Wound Width (cm)  1.2 cm measured 08/25/17    Wound Depth (cm)  0.3 cm measured 08/25/17    Wound Volume (cm^3)  0.36 cm^3    Wound Surface Area (cm^2)  1.2 cm^2    Margins  Epibole (rolled edges)    Drainage Amount  Minimal less drainage and thinner compared to prior session    Treatment  Cleansed;Debridement (Selective)    Wound Properties Date First Assessed: 06/04/17 Time First Assessed: 1437 Wound Type: Non-pressure wound Location: Leg Location Orientation: Left;Lateral;Lower Present on Admission: Yes   Dressing Type  Silver hydrofiber;Gauze (Comment)    Dressing Changed  Changed    Dressing Status  Dry    Dressing Change Frequency  PRN    Site / Wound Assessment  Dry    % Wound base Red or Granulating  65% rior readings considered healed area of initaial site    % Wound base Yellow/Fibrinous Exudate  35% prior readings considered healed area of initaial site    Peri-wound Assessment  Intact    Wound Length (cm)  1 cm measured 08/25/17    Wound Width (cm)  1.6 cm measured 08/25/17    Wound Depth (cm)  0.1 cm measured 08/25/17    Wound Volume (cm^3)  0.16 cm^3    Wound Surface Area (cm^2)  1.6 cm^2    Treatment  Cleansed;Debridement (Selective)    Selective Debridement - Location  both wounds     Selective Debridement - Tools Used  Forceps;Scalpel    Selective Debridement - Tissue Removed  slough    Wound Therapy - Clinical Statement  Patient making good progress with wound healing with less drainage noted on right posteriomedial wound. Improved granulation tissue present at both wounds. Both wounds remain dry with less drainage compared to prior sessions, continued with xeroform dressing as patient continues to have positive results.     Wound Therapy - Functional Problem List  difficulty in walking     Factors Delaying/Impairing Wound Healing  Altered sensation;Multiple medical problems;Vascular compromise    Hydrotherapy Plan  Debridement;Dressing change;Patient/family education     Wound Plan  Continue with wound care.  Remeasure weekly.        PT Short Term Goals - 08/27/17 1325      PT SHORT TERM GOAL #1   Title  Pt to understand signs and symptoms of cellulits and the importance of contacting MD if this occurs     Time  1    Period  Weeks    Status  Achieved      PT SHORT TERM GOAL #2  Title  PT lateral wound to be 75% granulated and to have no black eschar to reduce the risk of infection.    Baseline  11/29 - 65% currently no black eschar; 60% granulated     Time  2    Period  Weeks    Status  On-going      PT SHORT TERM GOAL #3   Title  Pt pain to be decreased to no greater than a 4/10 to allow pt to rest better    Baseline  -- 07/09/2017:  pain is no greater than a 4/10 except when debriding.  Pt unable to walk 10 minutes due to CHF revised goal to be so that pt can rest better.     Time  3    Period  Weeks    Status  Revised        PT Long Term Goals - 08/27/17 1326      PT LONG TERM GOAL #1   Title  Pt wound to be 100% granulated to prevent infection     Baseline  11/29 - lateral LLE 65%, posterior RLE - 30%    Time  4    Period  Weeks    Status  On-going      PT LONG TERM GOAL #2   Title  PT lateral  wound size to be no greater than 2x2 to allow pt to feel comfortable in self care of wound ; anterior wound to be healed     Baseline  11/29 - 1.6cm x 1.1 cm    Time  6    Period  Weeks    Status  On-going      PT LONG TERM GOAL #3   Title  If profore felt comfortable to pt pt to be educated in compression garments and why they may be benefical for pt.  Pt has had wounds come up on her Rt leg in the past as well     Baseline  -- 07/09/2017:  Await result of ABI    Time  6    Period  Weeks    Status  Deferred        Plan - 08/27/17 1322    Clinical Impression Statement  see above    Rehab Potential  Good    PT Frequency  2x / week    PT Duration  6 weeks    PT Treatment/Interventions  Other (comment) debridement and dressing  change     PT Next Visit Plan  continue to remeasure wounds on Tuesdays.     Consulted and Agree with Plan of Care  Patient       Patient will benefit from skilled therapeutic intervention in order to improve the following deficits and impairments:  Decreased activity tolerance, Increased edema, Difficulty walking, Pain  Visit Diagnosis: Pain in left leg  Leg wound, left, subsequent encounter  Leg wound, right, subsequent encounter  Difficulty in walking, not elsewhere classified   Problem List Patient Active Problem List   Diagnosis Date Noted  . Iron deficiency anemia 08/07/2017  . Heme positive stool 10/04/2015  . Mixed hyperlipidemia 06/29/2015  . Type 2 diabetes mellitus with stage 4 chronic kidney disease ([place]) 06/29/2015  . Encounter for adequacy testing for hemodialysis ([place]) 04/12/2014  . Chronic kidney disease, stage V ([place]) 04/12/2014  . 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 Encounter for therapeutic drug monitoring 03/30/2014  . Chronic diastolic heart failure ([place]) 03/15/2014  .  Renal failure (ARF), acute on chronic (HCC) 03/13/2014  . Anemia of chronic renal failure, stage 4 (severe) ([place]) 02/23/2014  . Folate deficiency 02/17/2014  . Aortic regurgitation 09/19/2012  . Atrial flutter ([place]) 09/14/2012  . Class 2 severe obesity due to excess calories with serious comorbidity and body mass index (BMI) of 35.0 to 35.9 in adult ([place]) 09/14/2012  . Type 2 diabetes mellitus with diabetic neuropathy ([place]) 09/14/2012  . SLE (systemic lupus erythematosus) ([place]) 09/14/2012  . Essential hypertension, benign 09/14/2012  . COPD (chronic obstructive pulmonary disease) ([place]) 09/14/2012    Debara Pickett, PT, DPT Physical Therapist with [facility]  08/27/2017 1:30 PM    [place], [place], [address] Phone: [phone]   Fax:   [phone]  Name: [name] MRN: [id number] Date of Birth: 04-Mar-1946

## 2017-08-31 ENCOUNTER — Emergency Department (HOSPITAL_COMMUNITY): Payer: Medicare Other

## 2017-08-31 ENCOUNTER — Telehealth (HOSPITAL_COMMUNITY): Payer: Self-pay | Admitting: Family Medicine

## 2017-08-31 ENCOUNTER — Other Ambulatory Visit: Payer: Self-pay

## 2017-08-31 ENCOUNTER — Encounter (HOSPITAL_COMMUNITY): Payer: Self-pay | Admitting: Cardiology

## 2017-08-31 ENCOUNTER — Emergency Department (HOSPITAL_COMMUNITY)
Admission: EM | Admit: 2017-08-31 | Discharge: 2017-08-31 | Disposition: A | Discharge status: Home / Self Care | Payer: Medicare Other | Attending: Emergency Medicine | Admitting: Emergency Medicine

## 2017-08-31 DIAGNOSIS — J449 Chronic obstructive pulmonary disease, unspecified: Secondary | ICD-10-CM | POA: Diagnosis not present

## 2017-08-31 DIAGNOSIS — M545 Low back pain: Secondary | ICD-10-CM | POA: Insufficient documentation

## 2017-08-31 DIAGNOSIS — Z794 Long term (current) use of insulin: Secondary | ICD-10-CM | POA: Diagnosis not present

## 2017-08-31 DIAGNOSIS — Z79899 Other long term (current) drug therapy: Secondary | ICD-10-CM | POA: Insufficient documentation

## 2017-08-31 DIAGNOSIS — E119 Type 2 diabetes mellitus without complications: Secondary | ICD-10-CM | POA: Diagnosis not present

## 2017-08-31 DIAGNOSIS — N184 Chronic kidney disease, stage 4 (severe): Secondary | ICD-10-CM | POA: Diagnosis not present

## 2017-08-31 DIAGNOSIS — R109 Unspecified abdominal pain: Secondary | ICD-10-CM | POA: Diagnosis not present

## 2017-08-31 DIAGNOSIS — I129 Hypertensive chronic kidney disease with stage 1 through stage 4 chronic kidney disease, or unspecified chronic kidney disease: Secondary | ICD-10-CM | POA: Insufficient documentation

## 2017-08-31 DIAGNOSIS — M25551 Pain in right hip: Secondary | ICD-10-CM | POA: Insufficient documentation

## 2017-08-31 DIAGNOSIS — Z7901 Long term (current) use of anticoagulants: Secondary | ICD-10-CM | POA: Insufficient documentation

## 2017-08-31 LAB — I-STAT CHEM 8, ED
BUN: 59 mg/dL — ABNORMAL HIGH (ref 6–20)
CALCIUM ION: 1.08 mmol/L — AB (ref 1.15–1.40)
CHLORIDE: 109 mmol/L (ref 101–111)
Creatinine, Ser: 2.7 mg/dL — ABNORMAL HIGH (ref 0.44–1.00)
Glucose, Bld: 83 mg/dL (ref 65–99)
HEMATOCRIT: 32 % — AB (ref 36.0–46.0)
Hemoglobin: 10.9 g/dL — ABNORMAL LOW (ref 12.0–15.0)
Potassium: 3.9 mmol/L (ref 3.5–5.1)
SODIUM: 143 mmol/L (ref 135–145)
TCO2: 25 mmol/L (ref 22–32)

## 2017-08-31 LAB — PROTIME-INR
INR: 1.87
Prothrombin Time: 21.3 seconds — ABNORMAL HIGH (ref 11.4–15.2)

## 2017-08-31 MED ORDER — OXYCODONE-ACETAMINOPHEN 5-325 MG PO TABS
1.0000 | ORAL_TABLET | Freq: Once | ORAL | Status: AC
  Administered 2017-08-31: 1 via ORAL
  Filled 2017-08-31: qty 1

## 2017-08-31 MED ORDER — HYDROCODONE-ACETAMINOPHEN 5-325 MG PO TABS: ORAL_TABLET | ORAL | 0 refills | Status: DC

## 2017-08-31 NOTE — ED Triage Notes (Signed)
Right sided abdominal pain, right hip and lower back pain.  Seen here for same.

## 2017-08-31 NOTE — Telephone Encounter (Signed)
08/31/17  Called to see about moving Tuesday appt... Jenny Reichmann is double booked.... No answer and unable to leave a message

## 2017-08-31 NOTE — ED Provider Notes (Signed)
[facility] EMERGENCY DEPARTMENT Provider Note   CSN: [id number] Arrival date & time: 08/31/17  0940     History   Chief Complaint Chief Complaint  Patient presents with  . Back Pain  . Hip Pain    HPI [name] is a 71 y.o. female.   Pt was seen at 1210. Per pt, c/o gradual onset and persistence of constant right sided low back and hip "aching pain" for the past 1 month.  Denies any change in her pain pattern.  Pain worsens with palpation of the area and body position changes.  Pt has been evaluated by her PMD and in the ED for same, and is due to see Ortho MD in 2 days. Pt has hx shingles rash, which has improved. Denies incont/retention of bowel or bladder, no saddle anesthesia, no focal motor weakness, no tingling/numbness in extremities, no fevers, no injury, no abd pain.   The symptoms have been associated with no other complaints. The patient has a significant history of similar symptoms previously, recently being evaluated for this complaint and multiple prior evals for same.    Past Medical History:  Diagnosis Date  . Anemia   . Asthma   . Atrial flutter [facility])    TEE/DCCV December 2013 - on Xarelto [facility])  . Bone spur of toe    Right 5th toe  . Chronic diastolic CHF (congestive heart failure) (HCC)    a. EF 50-55% by echo in 2015  . CKD (chronic kidney disease) stage 4, GFR 15-29 ml/min (HCC)    a. s/p fistula placement but not on HD. Followed by Nephrology.   . DVT of upper extremity (deep vein thrombosis) ([place])    Right basilic vein, June 3546  . Essential hypertension, benign   . SLE (systemic lupus erythematosus) ([place])   . Type 2 diabetes mellitus ([place])   . UTI (lower urinary tract infection)     Patient Active Problem List   Diagnosis Date Noted  . Iron deficiency anemia 08/07/2017  . Heme positive stool 10/04/2015  . Mixed hyperlipidemia 06/29/2015  . Type 2 diabetes mellitus with stage 4 chronic kidney disease ([place]) 06/29/2015  . Encounter  for adequacy testing for hemodialysis ([place]) 04/12/2014  . Chronic kidney disease, stage V ([place]) 04/12/2014  . 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 Encounter for therapeutic drug monitoring 03/30/2014  . Chronic diastolic heart failure ([place]) 03/15/2014  . Renal failure (ARF), acute on chronic (HCC) 03/13/2014  . Anemia of chronic renal failure, stage 4 (severe) ([place]) 02/23/2014  . Folate deficiency 02/17/2014  . Aortic regurgitation 09/19/2012  . Atrial flutter ([place]) 09/14/2012  . Class 2 severe obesity due to excess calories with serious comorbidity and body mass index (BMI) of 35.0 to 35.9 in adult ([place]) 09/14/2012  . Type 2 diabetes mellitus with diabetic neuropathy ([place]) 09/14/2012  . SLE (systemic lupus erythematosus) ([place]) 09/14/2012  . Essential hypertension, benign 09/14/2012  . COPD (chronic obstructive pulmonary disease) ([place]) 09/14/2012    Past Surgical History:  Procedure Laterality Date  . ABDOMINAL HYSTERECTOMY  1983  . ANKLE SURGERY  1993  . AV FISTULA PLACEMENT Left 03/27/2014   Procedure: ARTERIOVENOUS (AV) FISTULA CREATION;  Surgeon: Rosetta Posner, MD;  Location: [place];  Service: Vascular;  Laterality: Left;  . BACK SURGERY  1980  . CARDIOVASCULAR STRESS TEST  12/19/2009   no stress induced rhythm abnormalities, ekg negative for ischemia  . CARDIOVERSION  09/16/2012   Procedure:  CARDIOVERSION;  Surgeon: Sanda Klein, MD;  Location: [facility];  Service: Cardiovascular;  Laterality: N/A;  . COLONOSCOPY  09/20/2012   Dr. [name]: multiple tubular adenomas   . COLONOSCOPY  2005   Dr. [name]. Polyps, path unknown   . COLONOSCOPY N/A 07/24/2016   Procedure: COLONOSCOPY;  Surgeon: [name], MD;  [facility];  Service: Endoscopy;  Laterality: N/A;  9:00 am  . ESOPHAGOGASTRODUODENOSCOPY  09/20/2012   Dr. [name]: duodenal erosion and possible resolving ulcer at the angularis   . TEE WITHOUT  CARDIOVERSION  09/16/2012   Procedure: TRANSESOPHAGEAL ECHOCARDIOGRAM (TEE);  Surgeon: Sanda Klein, MD;  Location: [facility] ENDOSCOPY;  Service: Cardiovascular;  Laterality: N/A;  . TRANSESOPHAGEAL ECHOCARDIOGRAM WITH CARDIOVERSION  09/16/2012   EF 60-65%, moderate LVH, moderate regurg of the aortic valve, LA moderately dilated    OB History    No data available       Home Medications    Prior to Admission medications   Medication Sig Start Date End Date Taking? Authorizing Provider  acetaminophen (TYLENOL) 500 MG tablet Take 1,000 mg by mouth every 6 (six) hours as needed. For pain    [provider]  albuterol (ACCUNEB) 0.63 MG/3ML nebulizer solution Take 1 ampule by nebulization every 6 (six) hours as needed for wheezing or shortness of breath.     [provider]  albuterol (PROVENTIL HFA;VENTOLIN HFA) 108 (90 BASE) MCG/ACT inhaler Inhale 2 puffs into the lungs every 6 (six) hours as needed. For shortness of breath    [provider]  amLODipine (NORVASC) 10 MG tablet Take 1 tablet (10 mg total) by mouth at bedtime. 03/28/14   [name], MD  atorvastatin (LIPITOR) 40 MG tablet Take 40 mg at bedtime by mouth.     [provider]  brimonidine-timolol (COMBIGAN) 0.2-0.5 % ophthalmic solution Place 1 drop into the left eye daily.     [provider]  calcitRIOL (ROCALTROL) 0.25 MCG capsule Take 0.25 mcg by mouth as directed. Take on Mondays Wednesdays and Fridays    [provider]  Continuous Blood Gluc Sensor ([place]) MISC Use one sensor every 10 days. 07/24/17   Cassandria Anger, MD  diclofenac (VOLTAREN) 0.1 % ophthalmic solution Place 1 drop into the left eye daily. Uses at lunch time    [provider]  doxycycline (VIBRAMYCIN) 100 MG capsule Take 1 capsule (100 mg total) 2 (two) times daily by mouth. 08/11/17   Nat Christen, MD  febuxostat (ULORIC) 40 MG tablet Take 40 mg by mouth daily.     [provider]  folic acid (FOLVITE) 1 MG tablet Take 1 mg by mouth daily.    [provider]  furosemide (LASIX) 80 MG tablet Take 160 mg by mouth 2 (two) times daily.     [provider]  HUMALOG KWIKPEN 100 UNIT/ML KiwkPen INJECT UP TO 11 UNITS SUBCUTANEOUSLY BEFORE MEALS THREE TIMES DAILY. 07/24/17   Cassandria Anger, MD  HYDROcodone-acetaminophen (NORCO/VICODIN) 5-325 MG tablet 1 tab PO q12 hours prn pain 08/13/17   Francine Graven, DO  hydroxychloroquine (PLAQUENIL) 200 MG tablet Take 200 mg by mouth as directed. Take 1 1/2 tablets daily    [provider]  Insulin Detemir (LEVEMIR FLEXTOUCH [place]) Inject 15 Units into the skin at bedtime.    [provider]  metoprolol tartrate (LOPRESSOR) 50 MG tablet TAKE ONE TABLET BY MOUTH TWICE DAILY. 07/23/17   Satira Sark, MD  Multiple Vitamins-Minerals (  CENTRUM SILVER PO) Take 1 tablet by mouth daily.    [provider]  nystatin (MYCOSTATIN/NYSTOP) powder Apply 4 (four) times daily topically. 08/11/17   Nat Christen, MD  ONE TOUCH ULTRA TEST test strip TEST 4 TIMES A DAY AS DIRECTED. 06/09/17   [name], [name], MD  ONETOUCH DELICA LANCETS 57D MISC USE TO TEST 4 TIMES DAILY. 09/05/15   Cassandria Anger, MD  predniSONE (DELTASONE) 5 MG tablet Take 5 mg by mouth daily with breakfast.    [provider]  travoprost, benzalkonium, (TRAVATAN) 0.004 % ophthalmic solution Place 1 drop into the left eye at bedtime.    [provider]  UNIFINE PENTIPS 31G X 6 MM MISC USE AS DIRECTED AT BEDTIME WITH LEVEMIR AND WITH SLIDING SCALE INSULIN. 03/30/17   [name], [name], MD  Vitamin D, Ergocalciferol, (DRISDOL) 50000 units CAPS capsule TAKE 1 CAPSULE BY MOUTH ONCE WEEKLY. 06/17/17   Cassandria Anger, MD  warfarin (COUMADIN) 5 MG tablet Take 1 tablet daily Patient taking differently: Take 5 mg one time only at 6 PM by mouth. Take 1 tablet daily 01/26/17   Satira Sark, MD    Family History Family History  Problem Relation Age of Onset  . Diabetes Brother   . Diabetes Father   . Hypertension Father   . Hypertension Sister   . Stroke Mother   . Diabetes Sister   . Hypertension Brother   . Colon cancer Neg Hx     Social History Social History   Tobacco Use  . Smoking status: Former Smoker    Packs/day: 1.00    Years: 30.00    Pack years: 30.00    Types: Cigarettes    Last attempt to quit: 08/29/2012    Years since quitting: 5.0  . Smokeless tobacco: Never Used  . Tobacco comment: quit 08-2012  Substance Use Topics  . Alcohol use: No    Alcohol/week: 0.0 oz  . Drug use: No     Allergies   Ace inhibitors   Review of Systems Review of Systems  ROS: Statement: All systems negative except as marked or noted in the HPI; Constitutional: Negative for fever and chills. ; ; Eyes: Negative for eye pain, redness and discharge. ; ; ENMT: Negative for ear pain, hoarseness, nasal congestion, sinus pressure and sore throat. ; ; Cardiovascular: Negative for chest pain, palpitations, diaphoresis, dyspnea and peripheral edema. ; ; Respiratory: Negative for cough, wheezing and stridor. ; ; Gastrointestinal: Negative for nausea, vomiting, diarrhea, abdominal pain, blood in stool, hematemesis, jaundice and rectal bleeding. . ; ; Genitourinary: Negative for dysuria, flank pain and hematuria. ; ; Musculoskeletal: +back pain, hip pain. Negative for neck pain. Negative for swelling and trauma.; ; Skin: Negative for pruritus, rash, abrasions, blisters, bruising and skin lesion.; ; Neuro: Negative for headache, lightheadedness and neck stiffness. Negative for weakness, altered level of consciousness, altered mental status, extremity weakness, paresthesias, involuntary movement, seizure and syncope.       Physical Exam Updated Vital Signs BP (!) 156/90   Pulse 64   Temp 97.7 F (36.5 C) (Oral)   Resp 18   Ht 5\' 5"  (1.651 m)   Wt 92.5 kg (204 lb)    SpO2 99%   BMI 33.95 kg/m   Physical Exam 1215: Physical examination:  Nursing notes reviewed; Vital signs and O2 SAT reviewed;  Constitutional: Well developed, Well nourished, Well hydrated, In no acute distress; Head:  Normocephalic, atraumatic; Eyes: EOMI, PERRL, No scleral  icterus; ENMT: Mouth and pharynx normal, Mucous membranes moist; Neck: Supple, Full range of motion, No lymphadenopathy; Cardiovascular: Regular rate and rhythm, No gallop; Respiratory: Breath sounds clear & equal bilaterally, No wheezes.  Speaking full sentences with ease, Normal respiratory effort/excursion; Chest: Nontender, Movement normal; Abdomen: Soft, Nontender, Nondistended, Normal bowel sounds; Genitourinary: No CVA tenderness; Spine:  No midline CS, TS, LS tenderness. +TTP right lumbar paraspinal muscles.;; Extremities: Pulses normal, Pelvis stable. +right hip tenderness to palp. No deformity. No edema. No erythema, no ecchymosis. Healed/scabbed over previously blistering rash to right flank/hip.  No calf asymmetry.; Neuro: AA&Ox3, Major CN grossly intact.  Speech clear. No gross focal motor or sensory deficits in extremities.; Skin: Color normal, Warm, Dry.   ED Treatments / Results  Labs (all labs ordered are listed, but only abnormal results are displayed)   EKG  EKG Interpretation None       Radiology   Procedures Procedures (including critical care time)  Medications Ordered in ED Medications  oxyCODONE-acetaminophen (PERCOCET/ROXICET) 5-325 MG per tablet 1 tablet (not administered)     Initial Impression / Assessment and Plan / ED Course  I have reviewed the triage vital signs and the nursing notes.  Pertinent labs & imaging results that were available during my care of the patient were reviewed by me and considered in my medical decision making (see chart for details).  MDM Reviewed: previous chart, nursing note and vitals Reviewed previous: CT scan, labs and x-ray Interpretation: labs  and CT scan    Results for orders placed or performed during the hospital encounter of 08/31/17  Protime-INR  Result Value Ref Range   Prothrombin Time 21.3 (H) 11.4 - 15.2 seconds   INR 1.87   I-stat Chem 8, ED  Result Value Ref Range   Sodium 143 135 - 145 mmol/L   Potassium 3.9 3.5 - 5.1 mmol/L   Chloride 109 101 - 111 mmol/L   BUN 59 (H) 6 - 20 mg/dL   Creatinine, Ser 2.70 (H) 0.44 - 1.00 mg/dL   Glucose, Bld 83 65 - 99 mg/dL   Calcium, Ion 1.08 (L) 1.15 - 1.40 mmol/L   TCO2 25 22 - 32 mmol/L   Hemoglobin 10.9 (L) 12.0 - 15.0 g/dL   HCT 32.0 (L) 36.0 - 46.0 %   Ct Abdomen Pelvis Wo Contrast Result Date: 08/13/2017 CLINICAL DATA:  Unspecified abdominal pain.  Bilateral leg pain. EXAM: CT ABDOMEN AND PELVIS WITHOUT CONTRAST TECHNIQUE: Multidetector CT imaging of the abdomen and pelvis was performed following the standard protocol without IV contrast. COMPARISON:  Abdominal CT 01/10/2014 FINDINGS: Lower chest: Trace left pleural effusion seen medially, also present in 2005. Few septal lines at the bases without generalized edema. Hepatobiliary: Subtle layering cholelithiasis. Subcentimeter low-density in the right liver, too small for densitometry. Pancreas: Unremarkable. Spleen: Unremarkable. Adrenals/Urinary Tract: Negative adrenals. Renal hilar calcifications appear vascular. No hydronephrosis or ureteral stone. Unremarkable bladder. Stomach/Bowel:  No obstruction. No appendicitis. Vascular/Lymphatic: No acute vascular abnormality. Diffuse atherosclerotic calcification of the aorta and its branches. No mass or adenopathy. Reproductive:Hysterectomy.  Negative adnexae. Other: No ascites or pneumoperitoneum. Musculoskeletal: Skin thickening along the right back and flank, roughly in a horizontal band respecting midline. Subcutaneous reticulation over the right greater trochanter more than left greater trochanter of the femur, likely chronic pressure change/scarring. Diffuse degenerative disc  narrowing. Osteopenia. No acute osseous finding. IMPRESSION: 1. Right back/ flank skin thickening correlating with history of rash. Question shingles with this distribution. 2. No acute intra-abdominal finding. 3.  Trace partially loculated left pleural effusion. 4. Subtle cholelithiasis. Electronically Signed   By: Monte Fantasia M.D.   On: 08/13/2017 12:59    Dg Hip Unilat With Pelvis 2-3 Views Right Result Date: 08/10/2017 CLINICAL DATA:  Right hip pain with a palpable knot over the lateral aspect of the right mid femur for the past 2-3 weeks. No known injury. EXAM: DG HIP (WITH OR WITHOUT PELVIS) 2-3V RIGHT COMPARISON:  None in PACs FINDINGS: The bony pelvis is subjectively adequately mineralized. There is no lytic nor blastic lesion. There are degenerative changes of the lower lumbar spine. AP and lateral views of the right hip reveal mild symmetric narrowing of the joint space. The articular surfaces of the femoral head and acetabulum remains smoothly rounded. The femoral neck, intertrochanteric, and subtrochanteric regions exhibit no acute abnormalities. The overlying soft tissues are normal. There are vascular calcifications present. IMPRESSION: There is no acute bony abnormality of the right hip. There is mild to moderate osteoarthritic change of the right hip joint. Electronically Signed   By: David  [place] M.D.   On: 08/10/2017 10:04    Ct Renal Stone Study Result Date: 08/31/2017 CLINICAL DATA:  RIGHT-side abdominal pain, RIGHT hip and lower back pain, symptoms for 1 week, worsening EXAM: CT ABDOMEN AND PELVIS WITHOUT CONTRAST TECHNIQUE: Multidetector CT imaging of the abdomen and pelvis was performed following the standard protocol without IV contrast. Sagittal and coronal MPR images reconstructed from axial data set. Oral contrast was not administered for this indication. COMPARISON:  08/13/2017 FINDINGS: Lower chest: Minimal bibasilar atelectasis. Minimal pericardial fluid unchanged.  Hepatobiliary: Gallbladder normal appearance. Tiny low-attenuation foci RIGHT lobe liver unchanged. Pancreas: Normal appearance Spleen: Normal appearance Adrenals/Urinary Tract: Adrenal glands normal appearance tiny high attenuation nodules in both kidneys unchanged question high attenuation cysts. Kidneys otherwise unremarkable. No additional mass, hydronephrosis or hydroureter. Bladder unremarkable. Stomach/Bowel: Normal appendix. Stomach and bowel loops normal appearance for technique Vascular/Lymphatic: Extensive atherosclerotic calcifications aorta, iliac arteries, and visceral arteries. Significant plaque at origin of celiac artery. Minimal coronary arterial calcification. Aorta normal caliber. No adenopathy. Reproductive: Uterus surgically absent. Nonvisualization of ovaries. Other: No free air or free fluid. No hernia or inflammatory process. Musculoskeletal: Diffuse osseous demineralization. No acute osseous findings. IMPRESSION: Extensive atherosclerotic disease as above. Stable tiny nodular foci in the kidneys and liver, nonspecific. No definite acute intra-abdominal or intrapelvic abnormalities. Aortic Atherosclerosis (ICD10-I70.0). Electronically Signed   By: Lavonia Dana M.D.   On: 08/31/2017 13:19    1400:  Workup today similar to previous. Doubt septic joint. Tx symptomatically, f/u Ortho in 2 days. Dx and testing d/w pt and family.  Questions answered.  Verb understanding, agreeable to d/c home with outpt f/u.    Final Clinical Impressions(s) / ED Diagnoses   Final diagnoses:  None    ED Discharge Orders    None        Francine Graven, DO 09/04/17 [id number]

## 2017-08-31 NOTE — Discharge Instructions (Signed)
Take the prescription as directed.  Apply moist heat or ice to the area(s) of discomfort, for 15 minutes at a time, several times per day for the next few days.  Do not fall asleep on a heating or ice pack.  Go to the Orthopedic doctor on Wednesday as previously scheduled. Call your regular medical doctor today to schedule a follow up appointment this week.  Return to the Emergency Department immediately if worsening.

## 2017-09-01 ENCOUNTER — Encounter (HOSPITAL_COMMUNITY): Payer: Self-pay | Admitting: Physical Therapy

## 2017-09-01 ENCOUNTER — Ambulatory Visit (HOSPITAL_COMMUNITY): Payer: Medicare Other | Attending: Family Medicine | Admitting: Physical Therapy

## 2017-09-01 DIAGNOSIS — S81801D Unspecified open wound, right lower leg, subsequent encounter: Secondary | ICD-10-CM | POA: Diagnosis not present

## 2017-09-01 DIAGNOSIS — M79605 Pain in left leg: Secondary | ICD-10-CM | POA: Diagnosis not present

## 2017-09-01 DIAGNOSIS — S81802D Unspecified open wound, left lower leg, subsequent encounter: Secondary | ICD-10-CM

## 2017-09-01 DIAGNOSIS — R262 Difficulty in walking, not elsewhere classified: Secondary | ICD-10-CM | POA: Diagnosis not present

## 2017-09-01 NOTE — Therapy (Signed)
[place], [place], [address] Phone: [phone]   Fax:  [phone]  Wound Care Therapy  Patient Details  Name: [name] MRN: [id number] Date of Birth: [birth year] Referring Provider: Sharilyn Sites   Encounter Date: 09/01/2017  PT End of Session - 09/01/17 1300    Visit Number  24    Number of Visits  35    Date for PT Re-Evaluation  10/02/17    Authorization Type  [employer] medicare  cert 69/6-29/52, gcodes done visit #23    Authorization - Visit Number  24    Authorization - Number of Visits  35    PT Start Time  1120    PT Stop Time  1155    PT Time Calculation (min)  35 min    Activity Tolerance  Patient tolerated treatment well    Behavior During Therapy  [facility] for tasks assessed/performed       Past Medical History:  Diagnosis Date  . Anemia   . Asthma   . Atrial flutter [facility])    TEE/DCCV December 2013 - on Xarelto [facility])  . Bone spur of toe    Right 5th toe  . Chronic diastolic CHF (congestive heart failure) (HCC)    a. EF 50-55% by echo in 2015  . CKD (chronic kidney disease) stage 4, GFR 15-29 ml/min (HCC)    a. s/p fistula placement but not on HD. Followed by Nephrology.   . DVT of upper extremity (deep vein thrombosis) ([place])    Right basilic vein, June 8413  . Essential hypertension, benign   . SLE (systemic lupus erythematosus) ([place])   . Type 2 diabetes mellitus ([place])   . UTI (lower urinary tract infection)     Past Surgical History:  Procedure Laterality Date  . ABDOMINAL HYSTERECTOMY  1983  . ANKLE SURGERY  1993  . AV FISTULA PLACEMENT Left 03/27/2014   Procedure: ARTERIOVENOUS (AV) FISTULA CREATION;  Surgeon: Rosetta Posner, MD;  Location: [place];  Service: Vascular;  Laterality: Left;  . BACK SURGERY  1980  . CARDIOVASCULAR STRESS TEST  12/19/2009   no stress induced rhythm abnormalities, ekg negative for ischemia  . CARDIOVERSION  09/16/2012   Procedure: CARDIOVERSION;  Surgeon:  Sanda Klein, MD;  Location: [facility] ENDOSCOPY;  Service: Cardiovascular;  Laterality: N/A;  . COLONOSCOPY  09/20/2012   Dr. [name]: multiple tubular adenomas   . COLONOSCOPY  2005   Dr. [name]. Polyps, path unknown   . COLONOSCOPY N/A 07/24/2016   Procedure: COLONOSCOPY;  Surgeon: [name], MD;  [facility];  Service: Endoscopy;  Laterality: N/A;  9:00 am  . ESOPHAGOGASTRODUODENOSCOPY  09/20/2012   Dr. [name]: duodenal erosion and possible resolving ulcer at the angularis   . TEE WITHOUT CARDIOVERSION  09/16/2012   Procedure: TRANSESOPHAGEAL ECHOCARDIOGRAM (TEE);  Surgeon: Sanda Klein, MD;  Location: [facility] ENDOSCOPY;  Service: Cardiovascular;  Laterality: N/A;  . TRANSESOPHAGEAL ECHOCARDIOGRAM WITH CARDIOVERSION  09/16/2012   EF 60-65%, moderate LVH, moderate regurg of the aortic valve, LA moderately dilated    There were no vitals filed for this visit.              Wound Therapy - 09/01/17 1252    Subjective  Pt states that she is not feeling as well today; her Rt hip is really bothering her.     Patient and Family Stated Goals  Wounds to heal     Prior Treatments  self  care, antibiotic     Pain Assessment  0-10    Pain Score  10-Worst pain ever    Faces Pain Scale  Hurts a little bit    Pain Type  Acute pain    Pain Location  Hip    Pain Orientation  Right    Pain Descriptors / Indicators  Aching    Pain Intervention(s)  Emotional support    Wound Properties Date First Assessed: 06/25/17 Time First Assessed: 1100 Wound Type: Non-pressure wound Location: Leg Location Orientation: Posterior;Medial;Right Wound Description (Comments): blister   Dressing Type  Gauze (Comment);Impregnated gauze (bismuth)    Dressing Changed  Changed    Dressing Status  Old drainage    Dressing Change Frequency  PRN    Site / Wound Assessment  Pink;Yellow    % Wound base Red or Granulating  40%    % Wound base Yellow/Fibrinous Exudate  60%    Peri-wound Assessment  Intact     Wound Length (cm)  0.9 cm was 1    Wound Width (cm)  1.1 cm    Wound Depth (cm)  0.3 cm    Wound Volume (cm^3)  0.3 cm^3    Wound Surface Area (cm^2)  0.99 cm^2    Drainage Amount  Minimal less drainage and thinner compared to prior session    Treatment  Cleansed;Debridement (Selective)    Wound Properties Date First Assessed: 06/04/17 Time First Assessed: 8099 Wound Type: Non-pressure wound Location: Leg Location Orientation: Left;Lateral;Lower Present on Admission: Yes   Dressing Type  Gauze (Comment);Impregnated gauze (bismuth)    Dressing Changed  Changed    Dressing Status  Dry    Dressing Change Frequency  PRN    Site / Wound Assessment  Dry    % Wound base Red or Granulating  70% was 65%    % Wound base Yellow/Fibrinous Exudate  30% was 35%    Peri-wound Assessment  Intact    Wound Length (cm)  1.2 cm    Wound Width (cm)  0.8 cm    Wound Depth (cm)  0.2 cm    Wound Volume (cm^3)  0.19 cm^3    Wound Surface Area (cm^2)  0.96 cm^2    Selective Debridement - Location  both wounds     Selective Debridement - Tools Used  Forceps;Scalpel    Selective Debridement - Tissue Removed  slough    Wound Therapy - Clinical Statement  Wounds continue to increase in granulation.  Pt is going to the vascular surgeon next week.  Pt likes netting over the gauze but it is not really needed and therapist is fearful that the netting is rubbing pt skin  this was explained to the patient and no nettting was used this session     Wound Therapy - Functional Problem List  difficulty in walking     Factors Delaying/Impairing Wound Healing  Altered sensation;Multiple medical problems;Vascular compromise    Hydrotherapy Plan  Debridement;Dressing change;Patient/family education    Wound Plan  Continue with wound care.  Remeasure weekly.    Dressing   both wounds xeroform followed by gauze and tape                 PT Short Term Goals - 08/27/17 1325      PT SHORT TERM GOAL #1   Title  Pt to  understand signs and symptoms of cellulits and the importance of contacting MD if this occurs     Time  1  Period  Weeks    Status  Achieved      PT SHORT TERM GOAL #2   Title  PT lateral wound to be 75% granulated and to have no black eschar to reduce the risk of infection.    Baseline  11/29 - 65% currently no black eschar; 60% granulated     Time  2    Period  Weeks    Status  On-going      PT SHORT TERM GOAL #3   Title  Pt pain to be decreased to no greater than a 4/10 to allow pt to rest better    Baseline  -- 07/09/2017:  pain is no greater than a 4/10 except when debriding.  Pt unable to walk 10 minutes due to CHF revised goal to be so that pt can rest better.     Time  3    Period  Weeks    Status  Revised        PT Long Term Goals - 08/27/17 1326      PT LONG TERM GOAL #1   Title  Pt wound to be 100% granulated to prevent infection     Baseline  11/29 - lateral LLE 65%, posterior RLE - 30%    Time  4    Period  Weeks    Status  On-going      PT LONG TERM GOAL #2   Title  PT lateral  wound size to be no greater than 2x2 to allow pt to feel comfortable in self care of wound ; anterior wound to be healed     Baseline  11/29 - 1.6cm x 1.1 cm    Time  6    Period  Weeks    Status  On-going      PT LONG TERM GOAL #3   Title  If profore felt comfortable to pt pt to be educated in compression garments and why they may be benefical for pt.  Pt has had wounds come up on her Rt leg in the past as well     Baseline  -- 07/09/2017:  Await result of ABI    Time  6    Period  Weeks    Status  Deferred            Plan - 09/01/17 1303    Clinical Impression Statement  as above     Rehab Potential  Good    PT Frequency  2x / week    PT Duration  6 weeks    PT Treatment/Interventions  Other (comment) debridement and dressing change     PT Next Visit Plan  continue to remeasure wounds on Tuesdays.     Consulted and Agree with Plan of Care  Patient       Patient  will benefit from skilled therapeutic intervention in order to improve the following deficits and impairments:  Decreased activity tolerance, Increased edema, Difficulty walking, Pain  Visit Diagnosis: Pain in left leg  Leg wound, left, subsequent encounter  Leg wound, right, subsequent encounter  Difficulty in walking, not elsewhere classified     Problem List Patient Active Problem List   Diagnosis Date Noted  . Iron deficiency anemia 08/07/2017  . Heme positive stool 10/04/2015  . Mixed hyperlipidemia 06/29/2015  . Type 2 diabetes mellitus with stage 4 chronic kidney disease ([place]) 06/29/2015  . Encounter for adequacy testing for hemodialysis ([place]) 04/12/2014  . Chronic kidney disease, stage V ([place]) 04/12/2014  .  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 Encounter for therapeutic drug monitoring 03/30/2014  . Chronic diastolic heart failure ([place]) 03/15/2014  . Renal failure (ARF), acute on chronic (HCC) 03/13/2014  . Anemia of chronic renal failure, stage 4 (severe) ([place]) 02/23/2014  . Folate deficiency 02/17/2014  . Aortic regurgitation 09/19/2012  . Atrial flutter ([place]) 09/14/2012  . Class 2 severe obesity due to excess calories with serious comorbidity and body mass index (BMI) of 35.0 to 35.9 in adult ([place]) 09/14/2012  . Type 2 diabetes mellitus with diabetic neuropathy ([place]) 09/14/2012  . SLE (systemic lupus erythematosus) ([place]) 09/14/2012  . Essential hypertension, benign 09/14/2012  . COPD (chronic obstructive pulmonary disease) ([place]) 09/14/2012   Rayetta Humphrey, PT CLT [phone] 09/01/2017, 1:04 PM  [place] [address] [place], [place], [address] Phone: [phone]   Fax:  [phone]  Name: [name] MRN: [id number] Date of Birth: Dec 11, 1945

## 2017-09-02 ENCOUNTER — Encounter (HOSPITAL_COMMUNITY): Payer: Medicare Other | Attending: Oncology

## 2017-09-02 ENCOUNTER — Ambulatory Visit (INDEPENDENT_AMBULATORY_CARE_PROVIDER_SITE_OTHER): Payer: Medicare Other | Admitting: Orthopaedic Surgery

## 2017-09-02 ENCOUNTER — Other Ambulatory Visit: Payer: Self-pay

## 2017-09-02 ENCOUNTER — Encounter: Payer: Self-pay | Admitting: Orthopaedic Surgery

## 2017-09-02 ENCOUNTER — Encounter (HOSPITAL_COMMUNITY): Payer: Self-pay

## 2017-09-02 VITALS — BP 158/61 | HR 71 | Temp 97.2°F | Ht 64.0 in | Wt 197.0 lb

## 2017-09-02 VITALS — BP 145/60 | HR 64 | Temp 97.0°F | Resp 18

## 2017-09-02 DIAGNOSIS — E1140 Type 2 diabetes mellitus with diabetic neuropathy, unspecified: Secondary | ICD-10-CM

## 2017-09-02 DIAGNOSIS — N184 Chronic kidney disease, stage 4 (severe): Secondary | ICD-10-CM | POA: Diagnosis not present

## 2017-09-02 DIAGNOSIS — Z794 Long term (current) use of insulin: Secondary | ICD-10-CM | POA: Diagnosis not present

## 2017-09-02 DIAGNOSIS — M7061 Trochanteric bursitis, right hip: Secondary | ICD-10-CM | POA: Diagnosis not present

## 2017-09-02 DIAGNOSIS — D631 Anemia in chronic kidney disease: Secondary | ICD-10-CM

## 2017-09-02 DIAGNOSIS — D508 Other iron deficiency anemias: Secondary | ICD-10-CM

## 2017-09-02 DIAGNOSIS — Z7901 Long term (current) use of anticoagulants: Secondary | ICD-10-CM | POA: Diagnosis not present

## 2017-09-02 DIAGNOSIS — N058 Unspecified nephritic syndrome with other morphologic changes: Secondary | ICD-10-CM

## 2017-09-02 LAB — CBC WITH DIFFERENTIAL/PLATELET
BASOS PCT: 0 %
Basophils Absolute: 0 10*3/uL (ref 0.0–0.1)
EOS PCT: 1 %
Eosinophils Absolute: 0.1 10*3/uL (ref 0.0–0.7)
HEMATOCRIT: 33.8 % — AB (ref 36.0–46.0)
Hemoglobin: 9.7 g/dL — ABNORMAL LOW (ref 12.0–15.0)
LYMPHS PCT: 11 %
Lymphs Abs: 0.8 10*3/uL (ref 0.7–4.0)
MCH: 26.1 pg (ref 26.0–34.0)
MCHC: 28.7 g/dL — AB (ref 30.0–36.0)
MCV: 91.1 fL (ref 78.0–100.0)
MONO ABS: 0.6 10*3/uL (ref 0.1–1.0)
MONOS PCT: 9 %
NEUTROS PCT: 79 %
Neutro Abs: 5.7 10*3/uL (ref 1.7–7.7)
PLATELETS: 237 10*3/uL (ref 150–400)
RBC: 3.71 MIL/uL — ABNORMAL LOW (ref 3.87–5.11)
RDW: 21.4 % — AB (ref 11.5–15.5)
WBC: 7.2 10*3/uL (ref 4.0–10.5)

## 2017-09-02 MED ORDER — DARBEPOETIN ALFA 500 MCG/ML IJ SOSY
PREFILLED_SYRINGE | INTRAMUSCULAR | Status: AC
  Filled 2017-09-02: qty 1

## 2017-09-02 MED ORDER — DARBEPOETIN ALFA 500 MCG/ML IJ SOSY
500.0000 ug | PREFILLED_SYRINGE | Freq: Once | INTRAMUSCULAR | Status: AC
  Administered 2017-09-02: 500 ug via SUBCUTANEOUS

## 2017-09-02 NOTE — Progress Notes (Signed)
[name] presents today for injection per the provider's orders.  Aranesp administration without incident; see MAR for injection details.  Patient tolerated procedure well and without incident.  No questions or complaints noted at this time.  Discharged via wheelchair in c/o spouse.

## 2017-09-02 NOTE — Progress Notes (Signed)
Subjective:    Patient ID: [name], female    DOB: Jul 22, 1946, 71 y.o.   MRN: [id number]  HPI She has developed right hip pain laterally over the last month getting progressively worse.  She has no trauma.  She has some lateral swelling but no redness.  She has seen Dr. Hilma Favors for this and I have copies of his notes which I have reviewed.  She has pain that goes to the knee on the right.  X-rays of the right hip showed mild to moderated DJD.  She is on Coumadin.  She cannot take any NSAIDs.  She has tried heat and Tylenol with no help.  She is a diabetic on insulin.  She has COPD. She has lupus as well. I did ankle surgery on her many years ago.   Review of Systems  Constitutional: Positive for activity change.  HENT: Negative for congestion.   Respiratory: Positive for shortness of breath. Negative for cough.   Cardiovascular: Positive for palpitations. Negative for chest pain and leg swelling.  Gastrointestinal: Positive for constipation.  Endocrine: Positive for cold intolerance.  Musculoskeletal: Positive for arthralgias, back pain, gait problem and myalgias.  Skin: Positive for rash.  Allergic/Immunologic: Positive for environmental allergies.  Psychiatric/Behavioral: The patient is nervous/anxious.   All other systems reviewed and are negative.      Past Medical History:  Diagnosis Date  . Anemia   . Arthritis   . Asthma   . Atrial flutter [facility])    TEE/DCCV December 2013 - on Xarelto [facility])  . Bone spur of toe    Right 5th toe  . Chronic diastolic CHF (congestive heart failure) (HCC)    a. EF 50-55% by echo in 2015  . CKD (chronic kidney disease) stage 4, GFR 15-29 ml/min (HCC)    a. s/p fistula placement but not on HD. Followed by Nephrology.   Marland Kitchen COPD (chronic obstructive pulmonary disease) ([place])   . DVT of upper extremity (deep vein thrombosis) ([place])    Right basilic vein, June 4174  . Essential hypertension, benign   . Fractures   . SLE (systemic  lupus erythematosus) ([place])   . Type 2 diabetes mellitus ([place])   . UTI (lower urinary tract infection)     Past Surgical History:  Procedure Laterality Date  . ABDOMINAL HYSTERECTOMY  1983  . ANKLE SURGERY  1993  . AV FISTULA PLACEMENT Left 03/27/2014   Procedure: ARTERIOVENOUS (AV) FISTULA CREATION;  Surgeon: Rosetta Posner, MD;  Location: [place];  Service: Vascular;  Laterality: Left;  . BACK SURGERY  1980  . CARDIOVASCULAR STRESS TEST  12/19/2009   no stress induced rhythm abnormalities, ekg negative for ischemia  . CARDIOVERSION  09/16/2012   Procedure: CARDIOVERSION;  Surgeon: Sanda Klein, MD;  Location: [facility] ENDOSCOPY;  Service: Cardiovascular;  Laterality: N/A;  . COLONOSCOPY  09/20/2012   Dr. [name]: multiple tubular adenomas   . COLONOSCOPY  2005   Dr. [name]. Polyps, path unknown   . COLONOSCOPY N/A 07/24/2016   Procedure: COLONOSCOPY;  Surgeon: [name], MD;  [facility];  Service: Endoscopy;  Laterality: N/A;  9:00 am  . ESOPHAGOGASTRODUODENOSCOPY  09/20/2012   Dr. [name]: duodenal erosion and possible resolving ulcer at the angularis   . TEE WITHOUT CARDIOVERSION  09/16/2012   Procedure: TRANSESOPHAGEAL ECHOCARDIOGRAM (TEE);  Surgeon: Sanda Klein, MD;  Location: [facility] ENDOSCOPY;  Service: Cardiovascular;  Laterality: N/A;  . TRANSESOPHAGEAL ECHOCARDIOGRAM WITH CARDIOVERSION  09/16/2012   EF 60-65%,  moderate LVH, moderate regurg of the aortic valve, LA moderately dilated    Current Outpatient Medications on File Prior to Visit  Medication Sig Dispense Refill  . acetaminophen (TYLENOL) 500 MG tablet Take 1,000 mg by mouth every 6 (six) hours as needed. For pain    . albuterol (ACCUNEB) 0.63 MG/3ML nebulizer solution Take 1 ampule by nebulization every 6 (six) hours as needed for wheezing or shortness of breath.     Marland Kitchen albuterol (PROVENTIL HFA;VENTOLIN HFA) 108 (90 BASE) MCG/ACT inhaler Inhale 2 puffs into the lungs every 6 (six) hours as needed. For shortness  of breath    . amLODipine (NORVASC) 10 MG tablet Take 1 tablet (10 mg total) by mouth at bedtime. 30 tablet 0  . atorvastatin (LIPITOR) 40 MG tablet Take 40 mg at bedtime by mouth.     . brimonidine-timolol (COMBIGAN) 0.2-0.5 % ophthalmic solution Place 1 drop into the left eye daily.     . calcitRIOL (ROCALTROL) 0.25 MCG capsule Take 0.25 mcg by mouth as directed. Take on Mondays Wednesdays and Fridays    . Continuous Blood Gluc Sensor (FREESTYLE LIBRE SENSOR SYSTEM) MISC Use one sensor every 10 days. 3 each 2  . diclofenac (VOLTAREN) 0.1 % ophthalmic solution Place 1 drop into the left eye daily. Uses at lunch time    . doxycycline (VIBRAMYCIN) 100 MG capsule Take 1 capsule (100 mg total) 2 (two) times daily by mouth. 20 capsule 0  . febuxostat (ULORIC) 40 MG tablet Take 40 mg by mouth daily.    . folic acid (FOLVITE) 1 MG tablet Take 1 mg by mouth daily.    . furosemide (LASIX) 80 MG tablet Take 160 mg by mouth 2 (two) times daily.     Marland Kitchen HUMALOG KWIKPEN 100 UNIT/ML KiwkPen INJECT UP TO 11 UNITS SUBCUTANEOUSLY BEFORE MEALS THREE TIMES DAILY. 15 mL 2  . HYDROcodone-acetaminophen (NORCO/VICODIN) 5-325 MG tablet 1 or 2 tabs PO q12 hours prn pain 10 tablet 0  . hydroxychloroquine (PLAQUENIL) 200 MG tablet Take 200 mg by mouth as directed. Take 1 1/2 tablets daily    . Insulin Detemir (LEVEMIR FLEXTOUCH [place]) Inject 15 Units into the skin at bedtime.    . metoprolol tartrate (LOPRESSOR) 50 MG tablet TAKE ONE TABLET BY MOUTH TWICE DAILY. 60 tablet 0  . Multiple Vitamins-Minerals (CENTRUM SILVER PO) Take 1 tablet by mouth daily.    Marland Kitchen nystatin (MYCOSTATIN/NYSTOP) powder Apply 4 (four) times daily topically. 30 g 1  . ONE TOUCH ULTRA TEST test strip TEST 4 TIMES A DAY AS DIRECTED. 150 each 2  . ONETOUCH DELICA LANCETS 47Q MISC USE TO TEST 4 TIMES DAILY. 100 each 5  . predniSONE (DELTASONE) 5 MG tablet Take 5 mg by mouth daily with breakfast.    . travoprost, benzalkonium, (TRAVATAN) 0.004 % ophthalmic  solution Place 1 drop into the left eye at bedtime.    Marland Kitchen UNIFINE PENTIPS 31G X 6 MM MISC USE AS DIRECTED AT BEDTIME WITH LEVEMIR AND WITH SLIDING SCALE INSULIN. 100 each 5  . Vitamin D, Ergocalciferol, (DRISDOL) 50000 units CAPS capsule TAKE 1 CAPSULE BY MOUTH ONCE WEEKLY. 12 capsule 0  . warfarin (COUMADIN) 5 MG tablet Take 1 tablet daily (Patient taking differently: Take 5 mg one time only at 6 PM by mouth. Take 1 tablet daily) 180 tablet 3   No current facility-administered medications on file prior to visit.     Social History   Socioeconomic History  . Marital status: Married  Spouse name: Not on file  . Number of children: Not on file  . Years of education: Not on file  . Highest education level: Not on file  Social Needs  . Financial resource strain: Not on file  . Food insecurity - worry: Not on file  . Food insecurity - inability: Not on file  . Transportation needs - medical: Not on file  . Transportation needs - non-medical: Not on file  Occupational History  . Not on file  Tobacco Use  . Smoking status: Former Smoker    Packs/day: 1.00    Years: 30.00    Pack years: 30.00    Types: Cigarettes    Last attempt to quit: 08/29/2012    Years since quitting: 5.0  . Smokeless tobacco: Never Used  . Tobacco comment: quit 08-2012  Substance and Sexual Activity  . Alcohol use: No    Alcohol/week: 0.0 oz  . Drug use: No  . Sexual activity: Yes    Partners: Male  Other Topics Concern  . Not on file  Social History Narrative  . Not on file    Family History  Problem Relation Age of Onset  . Diabetes Brother   . Diabetes Father   . Hypertension Father   . Hypertension Sister   . Stroke Mother   . Diabetes Sister   . Hypertension Brother   . Colon cancer Neg Hx     BP (!) 158/61   Pulse 71   Temp (!) 97.2 F (36.2 C)   Ht 5\' 4"  (1.626 m)   Wt 197 lb (89.4 kg)   BMI 33.81 kg/m   Objective:   Physical Exam  Constitutional: She is oriented to person,  place, and time. She appears well-developed and well-nourished.  HENT:  Head: Normocephalic and atraumatic.  Eyes: Conjunctivae and EOM are normal. Pupils are equal, round, and reactive to light.  Neck: Normal range of motion. Neck supple.  Cardiovascular: Normal rate, regular rhythm and intact distal pulses.  Pulmonary/Chest: Effort normal.  Abdominal: Soft.  Musculoskeletal: She exhibits tenderness (Right lateral hip tender, chronic rash present, very tender over the trochanteric area, ROM hip good, no limp,  left hip negative.  ).  Neurological: She is alert and oriented to person, place, and time. She displays normal reflexes. No cranial nerve deficit. She exhibits normal muscle tone. Coordination normal.  Skin: Skin is warm and dry.  Psychiatric: She has a normal mood and affect. Her behavior is normal. Judgment and thought content normal.  Vitals reviewed.         Assessment & Plan:   Encounter Diagnoses  Name Primary?  . Trochanteric bursitis of right hip Yes  . On warfarin therapy   . Type 2 diabetes mellitus with diabetic neuropathy, with long-term current use of insulin ([place])    PROCEDURE NOTE:  The patient request injection, verbal consent was obtained.  The right trochanteric area of the hip was prepped appropriately after time out was performed.   Sterile technique was observed and injection of 1 cc of Depo-Medrol 40 mg with several cc's of plain xylocaine. Anesthesia was provided by ethyl chloride and a 20-gauge needle was used to inject the hip area. The injection was tolerated well.  A band aid dressing was applied.  The patient was advised to apply ice later today and tomorrow to the injection sight as needed.  I have explained her findings.  Return in two weeks.  Call if any problem.  Precautions discussed.  Electronically Signed Sanjuana Kava, MD 12/5/20182:24 PM

## 2017-09-03 ENCOUNTER — Encounter: Payer: Self-pay | Admitting: Vascular Surgery

## 2017-09-03 ENCOUNTER — Ambulatory Visit (HOSPITAL_COMMUNITY): Payer: Medicare Other | Admitting: Physical Therapy

## 2017-09-03 ENCOUNTER — Ambulatory Visit (INDEPENDENT_AMBULATORY_CARE_PROVIDER_SITE_OTHER): Payer: Medicare Other | Admitting: Vascular Surgery

## 2017-09-03 ENCOUNTER — Ambulatory Visit (HOSPITAL_COMMUNITY)
Admission: RE | Admit: 2017-09-03 | Discharge: 2017-09-03 | Disposition: A | Discharge status: Home / Self Care | Payer: Medicare Other | Source: Ambulatory Visit | Attending: Vascular Surgery | Admitting: Vascular Surgery

## 2017-09-03 ENCOUNTER — Other Ambulatory Visit: Payer: Self-pay | Admitting: "Endocrinology

## 2017-09-03 VITALS — BP 179/66 | HR 68 | Temp 98.0°F | Resp 18 | Ht 64.0 in | Wt 197.6 lb

## 2017-09-03 DIAGNOSIS — I739 Peripheral vascular disease, unspecified: Secondary | ICD-10-CM | POA: Diagnosis not present

## 2017-09-03 DIAGNOSIS — L97519 Non-pressure chronic ulcer of other part of right foot with unspecified severity: Secondary | ICD-10-CM | POA: Diagnosis not present

## 2017-09-03 DIAGNOSIS — N184 Chronic kidney disease, stage 4 (severe): Secondary | ICD-10-CM

## 2017-09-03 DIAGNOSIS — S81801D Unspecified open wound, right lower leg, subsequent encounter: Secondary | ICD-10-CM

## 2017-09-03 DIAGNOSIS — M79605 Pain in left leg: Secondary | ICD-10-CM | POA: Diagnosis not present

## 2017-09-03 DIAGNOSIS — I70202 Unspecified atherosclerosis of native arteries of extremities, left leg: Secondary | ICD-10-CM | POA: Diagnosis not present

## 2017-09-03 DIAGNOSIS — R262 Difficulty in walking, not elsewhere classified: Secondary | ICD-10-CM | POA: Diagnosis not present

## 2017-09-03 DIAGNOSIS — S81802D Unspecified open wound, left lower leg, subsequent encounter: Secondary | ICD-10-CM

## 2017-09-03 DIAGNOSIS — I70235 Atherosclerosis of native arteries of right leg with ulceration of other part of foot: Secondary | ICD-10-CM | POA: Insufficient documentation

## 2017-09-03 LAB — VAS US LOWER EXTREMITY ARTERIAL DUPLEX
LPTIBDISTSYS: 147 cm/s
LSFDPSV: -74 cm/s
LSFMPSV: -104 cm/s
LSFPPSV: 112 cm/s
Left popliteal prox sys PSV: 98 cm/s
RTIBDISTSYS: 30 cm/s
Right popliteal prox sys PSV: 51 cm/s
Right super femoral dist sys PSV: -31 cm/s
Right super femoral mid sys PSV: -49 cm/s
Right super femoral prox sys PSV: 69 cm/s

## 2017-09-03 NOTE — Progress Notes (Signed)
Referring Physician: Dr Sharilyn Sites  Patient name: [name] MRN: [id number] DOB: 28-Dec-1945 Sex: female  REASON FOR CONSULT: non healing wounds legs HPI: [name] is a 71 y.o. female, with a one-month history of bilateral calf wounds. She is currently being followed for these at the wound center in [place]. She states that the one on her left calf is improving. She says the one on her right calf has stayed about the same. She does not really know how these started. She denies rest pain in her feet. She has no history of claudication. She is on warfarin for atrial fibrillation. She has a history of renal dysfunction and his CK D4. She is followed by Dr. Moshe Cipro for this. Her most recent serum creatinine in October of this year was 2.7.   Past Medical History:  Diagnosis Date  . Anemia   . Arthritis   . Asthma   . Atrial fibrillation ([place])   . Atrial flutter [facility])    TEE/DCCV December 2013 - on Xarelto [facility])  . Bone spur of toe    Right 5th toe  . Chronic diastolic CHF (congestive heart failure) (HCC)    a. EF 50-55% by echo in 2015  . CKD (chronic kidney disease) stage 4, GFR 15-29 ml/min (HCC)    a. s/p fistula placement but not on HD. Followed by Nephrology.   Marland Kitchen COPD (chronic obstructive pulmonary disease) ([place])   . DVT of upper extremity (deep vein thrombosis) ([place])    Right basilic vein, June 0932  . Essential hypertension, benign   . Fractures   . SLE (systemic lupus erythematosus) ([place])   . Type 2 diabetes mellitus ([place])   . UTI (lower urinary tract infection)    Past Surgical History:  Procedure Laterality Date  . ABDOMINAL HYSTERECTOMY  1983  . ANKLE SURGERY  1993  . AV FISTULA PLACEMENT Left 03/27/2014   Procedure: ARTERIOVENOUS (AV) FISTULA CREATION;  Surgeon: Rosetta Posner, MD;  Location: [employer];  Service: Vascular;  Laterality: Left;  . BACK SURGERY  1980  . CARDIOVASCULAR STRESS TEST  12/19/2009   no stress induced rhythm  abnormalities, ekg negative for ischemia  . CARDIOVERSION  09/16/2012   Procedure: CARDIOVERSION;  Surgeon: Sanda Klein, MD;  Location: [facility] ENDOSCOPY;  Service: Cardiovascular;  Laterality: N/A;  . COLONOSCOPY  09/20/2012   Dr. [name]: multiple tubular adenomas   . COLONOSCOPY  2005   Dr. [name]. Polyps, path unknown   . COLONOSCOPY N/A 07/24/2016   Procedure: COLONOSCOPY;  Surgeon: [name], MD;  [facility];  Service: Endoscopy;  Laterality: N/A;  9:00 am  . ESOPHAGOGASTRODUODENOSCOPY  09/20/2012   Dr. [name]: duodenal erosion and possible resolving ulcer at the angularis   . TEE WITHOUT CARDIOVERSION  09/16/2012   Procedure: TRANSESOPHAGEAL ECHOCARDIOGRAM (TEE);  Surgeon: Sanda Klein, MD;  Location: [facility] ENDOSCOPY;  Service: Cardiovascular;  Laterality: N/A;  . TRANSESOPHAGEAL ECHOCARDIOGRAM WITH CARDIOVERSION  09/16/2012   EF 60-65%, moderate LVH, moderate regurg of the aortic valve, LA moderately dilated    Family History  Problem Relation Age of Onset  . Diabetes Brother   . Diabetes Father   . Hypertension Father   . Hypertension Sister   . Stroke Mother   . Diabetes Sister   . Hypertension Brother   . Colon cancer Neg Hx     SOCIAL HISTORY: Social History   Socioeconomic History  . Marital status: Married    Spouse name: Not  on file  . Number of children: Not on file  . Years of education: Not on file  . Highest education level: Not on file  Social Needs  . Financial resource strain: Not on file  . Food insecurity - worry: Not on file  . Food insecurity - inability: Not on file  . Transportation needs - medical: Not on file  . Transportation needs - non-medical: Not on file  Occupational History  . Not on file  Tobacco Use  . Smoking status: Former Smoker    Packs/day: 1.00    Years: 30.00    Pack years: 30.00    Types: Cigarettes    Last attempt to quit: 08/29/2012    Years since quitting: 5.0  . Smokeless tobacco: Never Used  .  Tobacco comment: quit 08-2012  Substance and Sexual Activity  . Alcohol use: No    Alcohol/week: 0.0 oz  . Drug use: No  . Sexual activity: Yes    Partners: Male  Other Topics Concern  . Not on file  Social History Narrative  . Not on file    Allergies  Allergen Reactions  . Ace Inhibitors Cough    Current Outpatient Medications  Medication Sig Dispense Refill  . acetaminophen (TYLENOL) 500 MG tablet Take 1,000 mg by mouth every 6 (six) hours as needed. For pain    . albuterol (ACCUNEB) 0.63 MG/3ML nebulizer solution Take 1 ampule by nebulization every 6 (six) hours as needed for wheezing or shortness of breath.     Marland Kitchen albuterol (PROVENTIL HFA;VENTOLIN HFA) 108 (90 BASE) MCG/ACT inhaler Inhale 2 puffs into the lungs every 6 (six) hours as needed. For shortness of breath    . amLODipine (NORVASC) 10 MG tablet Take 1 tablet (10 mg total) by mouth at bedtime. 30 tablet 0  . atorvastatin (LIPITOR) 40 MG tablet Take 40 mg at bedtime by mouth.     . brimonidine-timolol (COMBIGAN) 0.2-0.5 % ophthalmic solution Place 1 drop into the left eye daily.     . calcitRIOL (ROCALTROL) 0.25 MCG capsule Take 0.25 mcg by mouth as directed. Take on Mondays Wednesdays and Fridays    . Continuous Blood Gluc Sensor (FREESTYLE LIBRE SENSOR SYSTEM) MISC Use one sensor every 10 days. 3 each 2  . diclofenac (VOLTAREN) 0.1 % ophthalmic solution Place 1 drop into the left eye daily. Uses at lunch time    . doxycycline (VIBRAMYCIN) 100 MG capsule Take 1 capsule (100 mg total) 2 (two) times daily by mouth. 20 capsule 0  . febuxostat (ULORIC) 40 MG tablet Take 40 mg by mouth daily.    . folic acid (FOLVITE) 1 MG tablet Take 1 mg by mouth daily.    . furosemide (LASIX) 80 MG tablet Take 160 mg by mouth 2 (two) times daily.     Marland Kitchen HUMALOG KWIKPEN 100 UNIT/ML KiwkPen INJECT UP TO 11 UNITS SUBCUTANEOUSLY BEFORE MEALS THREE TIMES DAILY. 15 mL 2  . HYDROcodone-acetaminophen (NORCO/VICODIN) 5-325 MG tablet 1 or 2 tabs PO  q12 hours prn pain 10 tablet 0  . hydroxychloroquine (PLAQUENIL) 200 MG tablet Take 200 mg by mouth as directed. Take 1 1/2 tablets daily    . Insulin Detemir (LEVEMIR FLEXTOUCH [place]) Inject 15 Units into the skin at bedtime.    . metoprolol tartrate (LOPRESSOR) 50 MG tablet TAKE ONE TABLET BY MOUTH TWICE DAILY. 60 tablet 0  . Multiple Vitamins-Minerals (CENTRUM SILVER PO) Take 1 tablet by mouth daily.    Marland Kitchen nystatin (MYCOSTATIN/NYSTOP) powder Apply  4 (four) times daily topically. 30 g 1  . ONE TOUCH ULTRA TEST test strip TEST 4 TIMES A DAY AS DIRECTED. 150 each 2  . ONETOUCH DELICA LANCETS 37T MISC USE TO TEST 4 TIMES DAILY. 100 each 5  . predniSONE (DELTASONE) 5 MG tablet Take 5 mg by mouth daily with breakfast.    . travoprost, benzalkonium, (TRAVATAN) 0.004 % ophthalmic solution Place 1 drop into the left eye at bedtime.    Marland Kitchen UNIFINE PENTIPS 31G X 6 MM MISC USE AS DIRECTED AT BEDTIME WITH LEVEMIR AND WITH SLIDING SCALE INSULIN. 100 each 5  . Vitamin D, Ergocalciferol, (DRISDOL) 50000 units CAPS capsule TAKE 1 CAPSULE BY MOUTH ONCE WEEKLY. 12 capsule 0  . warfarin (COUMADIN) 5 MG tablet Take 1 tablet daily (Patient taking differently: Take 5 mg one time only at 6 PM by mouth. Take 1 tablet daily) 180 tablet 3   No current facility-administered medications for this visit.     ROS:   General:  No weight loss, Fever, chills  HEENT: No recent headaches, no nasal bleeding, no visual changes, no sore throat  Neurologic: No dizziness, blackouts, seizures. No recent symptoms of stroke or mini- stroke. No recent episodes of slurred speech, or temporary blindness.  Cardiac: No recent episodes of chest pain/pressure, no shortness of breath at rest.  No shortness of breath with exertion.  +history of atrial fibrillation or irregular heartbeat  Vascular: No history of rest pain in feet.  No history of claudication.  + history of non-healing ulcer, No history of DVT   Pulmonary: No home oxygen, no  productive cough, no hemoptysis,  No asthma or wheezing  Musculoskeletal:  [ ]  Arthritis, [ ]  Low back pain,  [ ]  Joint pain  Hematologic:No history of hypercoagulable state.  No history of easy bleeding.  No history of anemia  Gastrointestinal: No hematochezia or melena,  No gastroesophageal reflux, no trouble swallowing  Urinary: [X]  chronic Kidney disease, [ ]  on HD - [ ]  MWF or [ ]  TTHS, [ ]  Burning with urination, [ ]  Frequent urination, [ ]  Difficulty urinating;   Skin: No rashes  Psychological: No history of anxiety,  No history of depression   Physical Examination  Vitals:   09/03/17 1455 09/03/17 1456  BP: (!) 181/59 (!) 179/66  Pulse: 68   Resp: 18   Temp: 98 F (36.7 C)   TempSrc: Oral   SpO2: 100%   Weight: 197 lb 9.6 oz (89.6 kg)   Height: 5\' 4"  (1.626 m)     Body mass index is 33.92 kg/m.  General:  Alert and oriented, no acute distress HEENT: Normal Neck: No bruit or JVD Pulmonary: Clear to auscultation bilaterally Cardiac: Regular Rate and Rhythm without murmur Abdomen: Soft, non-tender, non-distended, no mass Skin: No rash Extremity Pulses:  2+ femoral, absent popliteal dorsalis pedis, posterior tibial pulses bilaterally, palpable thrill left upper arm AV fistula Musculoskeletal: No deformity trace pretibial edema bilaterally  Neurologic: Upper and lower extremity motor 5/5 and symmetric  DATA:  Patient had bilateral ABIs performed at [facility] on 07/15/2017. This showed an ABI on the right of 0.8 left was 1.06 monophasic waveforms bilaterally  She had a bilateral lower extremity arterial duplex exam today. This was suggestive of a right superficial femoral artery stenosis greater than 80%. No hemodynamically significant stenosis in the left leg.      ASSESSMENT:  Bilateral calf ulcers. Unknown etiology. She does have evidence of a right superficial femoral  artery stenosis. However, this is usually not significant enough to cause  nonhealing of a calf wound. If she had wounds on her foot that would be probably more likely. However, the calf wound is fairly deep and punched out suggestive of possible arterial etiology. The left leg wound is healing gradually and I do not believe is going to be an issue. However, the problem is that she has significant renal dysfunction with a baseline serum creatinine of 2.7 and CK D4. In order to further evaluate her arterial tree she would require an arteriogram most likely using contrast material that may be nephrotoxic and could tip her over on the dialysis. I believe the best option at this point would be continued local wound care and if the wounds are continuing to heal with conservative measures that would be the best option. However, if her wounds are not making progress are growing in size than I believe we would have to proceed with an arteriogram. She will follow-up with me in 4-6 weeks for further evaluation and consideration of an arteriogram at that point.   PLAN:  See above   [name], MD [facility] Office: [phone] Pager: [phone]

## 2017-09-03 NOTE — Therapy (Signed)
[place], [place], [address] Phone: [phone]   Fax:  [phone]  Wound Care Therapy  Patient Details  Name: [name] MRN: [id number] Date of Birth: July 08, 1946 Referring Provider: Sharilyn Sites   Encounter Date: 09/03/2017    Past Medical History:  Diagnosis Date  . Anemia   . Arthritis   . Asthma   . Atrial flutter [facility])    TEE/DCCV December 2013 - on Xarelto [facility])  . Bone spur of toe    Right 5th toe  . Chronic diastolic CHF (congestive heart failure) (HCC)    a. EF 50-55% by echo in 2015  . CKD (chronic kidney disease) stage 4, GFR 15-29 ml/min (HCC)    a. s/p fistula placement but not on HD. Followed by Nephrology.   Marland Kitchen COPD (chronic obstructive pulmonary disease) ([place])   . DVT of upper extremity (deep vein thrombosis) ([place])    Right basilic vein, June 6378  . Essential hypertension, benign   . Fractures   . SLE (systemic lupus erythematosus) ([place])   . Type 2 diabetes mellitus ([place])   . UTI (lower urinary tract infection)     Past Surgical History:  Procedure Laterality Date  . ABDOMINAL HYSTERECTOMY  1983  . ANKLE SURGERY  1993  . AV FISTULA PLACEMENT Left 03/27/2014   Procedure: ARTERIOVENOUS (AV) FISTULA CREATION;  Surgeon: Rosetta Posner, MD;  Location: [place];  Service: Vascular;  Laterality: Left;  . BACK SURGERY  1980  . CARDIOVASCULAR STRESS TEST  12/19/2009   no stress induced rhythm abnormalities, ekg negative for ischemia  . CARDIOVERSION  09/16/2012   Procedure: CARDIOVERSION;  Surgeon: Sanda Klein, MD;  Location: [facility] ENDOSCOPY;  Service: Cardiovascular;  Laterality: N/A;  . COLONOSCOPY  09/20/2012   Dr. [name]: multiple tubular adenomas   . COLONOSCOPY  2005   Dr. [name]. Polyps, path unknown   . COLONOSCOPY N/A 07/24/2016   Procedure: COLONOSCOPY;  Surgeon: [name], MD;  [facility];  Service: Endoscopy;  Laterality: N/A;  9:00 am  .  ESOPHAGOGASTRODUODENOSCOPY  09/20/2012   Dr. [name]: duodenal erosion and possible resolving ulcer at the angularis   . TEE WITHOUT CARDIOVERSION  09/16/2012   Procedure: TRANSESOPHAGEAL ECHOCARDIOGRAM (TEE);  Surgeon: Sanda Klein, MD;  Location: [facility] ENDOSCOPY;  Service: Cardiovascular;  Laterality: N/A;  . TRANSESOPHAGEAL ECHOCARDIOGRAM WITH CARDIOVERSION  09/16/2012   EF 60-65%, moderate LVH, moderate regurg of the aortic valve, LA moderately dilated    There were no vitals filed for this visit.              Wound Therapy - 09/03/17 1211    Subjective  Pt states she went to Dr. Luna Glasgow yesterday and got an injection in her Rt hip.  STates it has not helped yet.     Patient and Family Stated Goals  Wounds to heal     Prior Treatments  self care, antibiotic     Pain Assessment  0-10    Pain Score  10-Worst pain ever    Pain Location  Hip    Pain Orientation  Right    Pain Descriptors / Indicators  Aching;Constant    Pain Intervention(s)  Emotional support;Repositioned    Wound Properties Date First Assessed: 06/25/17 Time First Assessed: 1100 Wound Type: Non-pressure wound Location: Leg Location Orientation: Posterior;Medial;Right Wound Description (Comments): blister   Dressing Type  Gauze (Comment);Impregnated gauze (bismuth)    Dressing Changed  Changed  Dressing Status  Old drainage    Dressing Change Frequency  PRN    Site / Wound Assessment  Pink;Yellow    % Wound base Red or Granulating  40%    % Wound base Yellow/Fibrinous Exudate  60%    Peri-wound Assessment  Intact    Drainage Amount  Minimal less drainage and thinner compared to prior session    Treatment  Cleansed;Debridement (Selective)    Wound Properties Date First Assessed: 06/04/17 Time First Assessed: 1437 Wound Type: Non-pressure wound Location: Leg Location Orientation: Left;Lateral;Lower Present on Admission: Yes   Dressing Type  Gauze (Comment);Impregnated gauze (bismuth)    Dressing Status   Dry    Dressing Change Frequency  PRN    Site / Wound Assessment  Dry    % Wound base Red or Granulating  70% was 65%    % Wound base Yellow/Fibrinous Exudate  30% was 35%    Peri-wound Assessment  Intact    Treatment  Cleansed;Debridement (Selective)    Selective Debridement - Location  both wounds     Selective Debridement - Tools Used  Forceps;Scalpel    Selective Debridement - Tissue Removed  slough    Wound Therapy - Clinical Statement  Lt LE continues to decrease in size, little change in Rt LE as compared to last session.  Debrided, cleansed and redressed with xeroform.  PT with difficulty finding comfort due to Rt hip pain.  Minimal pain with wounds, just with debridement.     Wound Therapy - Functional Problem List  difficulty in walking     Factors Delaying/Impairing Wound Healing  Altered sensation;Multiple medical problems;Vascular compromise    Hydrotherapy Plan  Debridement;Dressing change;Patient/family education    Wound Plan  Continue with wound care.  Remeasure weekly.    Dressing   both wounds xeroform followed by gauze and tape                 PT Short Term Goals - 08/27/17 1325      PT SHORT TERM GOAL #1   Title  Pt to understand signs and symptoms of cellulits and the importance of contacting MD if this occurs     Time  1    Period  Weeks    Status  Achieved      PT SHORT TERM GOAL #2   Title  PT lateral wound to be 75% granulated and to have no black eschar to reduce the risk of infection.    Baseline  11/29 - 65% currently no black eschar; 60% granulated     Time  2    Period  Weeks    Status  On-going      PT SHORT TERM GOAL #3   Title  Pt pain to be decreased to no greater than a 4/10 to allow pt to rest better    Baseline  -- 07/09/2017:  pain is no greater than a 4/10 except when debriding.  Pt unable to walk 10 minutes due to CHF revised goal to be so that pt can rest better.     Time  3    Period  Weeks    Status  Revised        PT  Long Term Goals - 08/27/17 1326      PT LONG TERM GOAL #1   Title  Pt wound to be 100% granulated to prevent infection     Baseline  11/29 - lateral LLE 65%, posterior RLE - 30%  Time  4    Period  Weeks    Status  On-going      PT LONG TERM GOAL #2   Title  PT lateral  wound size to be no greater than 2x2 to allow pt to feel comfortable in self care of wound ; anterior wound to be healed     Baseline  11/29 - 1.6cm x 1.1 cm    Time  6    Period  Weeks    Status  On-going      PT LONG TERM GOAL #3   Title  If profore felt comfortable to pt pt to be educated in compression garments and why they may be benefical for pt.  Pt has had wounds come up on her Rt leg in the past as well     Baseline  -- 07/09/2017:  Await result of ABI    Time  6    Period  Weeks    Status  Deferred              Patient will benefit from skilled therapeutic intervention in order to improve the following deficits and impairments:     Visit Diagnosis: Pain in left leg  Leg wound, left, subsequent encounter  Leg wound, right, subsequent encounter     Problem List Patient Active Problem List   Diagnosis Date Noted  . Iron deficiency anemia 08/07/2017  . Heme positive stool 10/04/2015  . Mixed hyperlipidemia 06/29/2015  . Type 2 diabetes mellitus with stage 4 chronic kidney disease ([place]) 06/29/2015  . Encounter for adequacy testing for hemodialysis ([place]) 04/12/2014  . Chronic kidney disease, stage V ([place]) 04/12/2014  . 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 Encounter for therapeutic drug monitoring 03/30/2014  . Chronic diastolic heart failure ([place]) 03/15/2014  . Renal failure (ARF), acute on chronic (HCC) 03/13/2014  . Anemia of chronic renal failure, stage 4 (severe) ([place]) 02/23/2014  . Folate deficiency 02/17/2014  . Aortic regurgitation 09/19/2012  . Atrial flutter ([place]) 09/14/2012  . Class 2 severe obesity due to excess  calories with serious comorbidity and body mass index (BMI) of 35.0 to 35.9 in adult ([place]) 09/14/2012  . Type 2 diabetes mellitus with diabetic neuropathy ([place]) 09/14/2012  . SLE (systemic lupus erythematosus) ([place]) 09/14/2012  . Essential hypertension, benign 09/14/2012  . COPD (chronic obstructive pulmonary disease) ([place]) 09/14/2012   Teena Irani, PTA/CLT [id number]  Teena Irani 09/03/2017, 12:15 PM  [facility] [address] [place], [place], [address] Phone: [phone]   Fax:  [phone]  Name: [name] MRN: [id number] Date of Birth: 1946/04/22

## 2017-09-04 ENCOUNTER — Encounter: Payer: Self-pay | Admitting: Family Medicine

## 2017-09-04 DIAGNOSIS — E790 Hyperuricemia without signs of inflammatory arthritis and tophaceous disease: Secondary | ICD-10-CM | POA: Diagnosis not present

## 2017-09-04 DIAGNOSIS — M79641 Pain in right hand: Secondary | ICD-10-CM | POA: Diagnosis not present

## 2017-09-04 DIAGNOSIS — N189 Chronic kidney disease, unspecified: Secondary | ICD-10-CM | POA: Diagnosis not present

## 2017-09-04 DIAGNOSIS — M79642 Pain in left hand: Secondary | ICD-10-CM | POA: Diagnosis not present

## 2017-09-04 DIAGNOSIS — Z7952 Long term (current) use of systemic steroids: Secondary | ICD-10-CM | POA: Diagnosis not present

## 2017-09-04 DIAGNOSIS — D649 Anemia, unspecified: Secondary | ICD-10-CM | POA: Diagnosis not present

## 2017-09-04 DIAGNOSIS — M19041 Primary osteoarthritis, right hand: Secondary | ICD-10-CM | POA: Diagnosis not present

## 2017-09-04 DIAGNOSIS — M19042 Primary osteoarthritis, left hand: Secondary | ICD-10-CM | POA: Diagnosis not present

## 2017-09-04 DIAGNOSIS — M329 Systemic lupus erythematosus, unspecified: Secondary | ICD-10-CM | POA: Diagnosis not present

## 2017-09-05 ENCOUNTER — Other Ambulatory Visit: Payer: Self-pay | Admitting: "Endocrinology

## 2017-09-08 ENCOUNTER — Ambulatory Visit (HOSPITAL_COMMUNITY): Payer: Medicare Other | Admitting: Physical Therapy

## 2017-09-08 DIAGNOSIS — E1151 Type 2 diabetes mellitus with diabetic peripheral angiopathy without gangrene: Secondary | ICD-10-CM | POA: Diagnosis not present

## 2017-09-08 DIAGNOSIS — S81802D Unspecified open wound, left lower leg, subsequent encounter: Secondary | ICD-10-CM | POA: Diagnosis not present

## 2017-09-08 DIAGNOSIS — S81801D Unspecified open wound, right lower leg, subsequent encounter: Secondary | ICD-10-CM

## 2017-09-08 DIAGNOSIS — R262 Difficulty in walking, not elsewhere classified: Secondary | ICD-10-CM | POA: Diagnosis not present

## 2017-09-08 DIAGNOSIS — E1140 Type 2 diabetes mellitus with diabetic neuropathy, unspecified: Secondary | ICD-10-CM | POA: Diagnosis not present

## 2017-09-08 DIAGNOSIS — L89892 Pressure ulcer of other site, stage 2: Secondary | ICD-10-CM | POA: Diagnosis not present

## 2017-09-08 DIAGNOSIS — M79605 Pain in left leg: Secondary | ICD-10-CM

## 2017-09-08 NOTE — Therapy (Signed)
[place], [place], [address] Phone: [phone]   Fax:  [phone]  Wound Care Therapy  Patient Details  Name: [name] MRN: [id number] Date of Birth: Mar 29, 1946 Referring Provider: Sharilyn Sites   Encounter Date: 09/08/2017  PT End of Session - 09/08/17 1118    Visit Number  25    Number of Visits  35    Date for PT Re-Evaluation  10/02/17    Authorization Type  [employer] medicare  cert 67/6-19/50, gcodes done visit #23    Authorization - Visit Number  25    Authorization - Number of Visits  35    PT Start Time  1030    PT Stop Time  1110    PT Time Calculation (min)  40 min    Activity Tolerance  Patient tolerated treatment well    Behavior During Therapy  [facility] for tasks assessed/performed       Past Medical History:  Diagnosis Date  . Anemia   . Arthritis   . Asthma   . Atrial fibrillation ([place])   . Atrial flutter [facility])    TEE/DCCV December 2013 - on Xarelto [facility])  . Bone spur of toe    Right 5th toe  . Chronic diastolic CHF (congestive heart failure) (HCC)    a. EF 50-55% by echo in 2015  . CKD (chronic kidney disease) stage 4, GFR 15-29 ml/min (HCC)    a. s/p fistula placement but not on HD. Followed by Nephrology.   Marland Kitchen COPD (chronic obstructive pulmonary disease) ([place])   . DVT of upper extremity (deep vein thrombosis) ([place])    Right basilic vein, June 9326  . Essential hypertension, benign   . Fractures   . SLE (systemic lupus erythematosus) ([place])   . Type 2 diabetes mellitus ([place])   . UTI (lower urinary tract infection)     Past Surgical History:  Procedure Laterality Date  . ABDOMINAL HYSTERECTOMY  1983  . ANKLE SURGERY  1993  . AV FISTULA PLACEMENT Left 03/27/2014   Procedure: ARTERIOVENOUS (AV) FISTULA CREATION;  Surgeon: Rosetta Posner, MD;  Location: [place];  Service: Vascular;  Laterality: Left;  . BACK SURGERY  1980  . CARDIOVASCULAR STRESS TEST  12/19/2009   no stress induced  rhythm abnormalities, ekg negative for ischemia  . CARDIOVERSION  09/16/2012   Procedure: CARDIOVERSION;  Surgeon: Sanda Klein, MD;  Location: [facility] ENDOSCOPY;  Service: Cardiovascular;  Laterality: N/A;  . COLONOSCOPY  09/20/2012   Dr. [name]: multiple tubular adenomas   . COLONOSCOPY  2005   Dr. [name]. Polyps, path unknown   . COLONOSCOPY N/A 07/24/2016   Procedure: COLONOSCOPY;  Surgeon: [name], MD;  [facility];  Service: Endoscopy;  Laterality: N/A;  9:00 am  . ESOPHAGOGASTRODUODENOSCOPY  09/20/2012   Dr. [name]: duodenal erosion and possible resolving ulcer at the angularis   . TEE WITHOUT CARDIOVERSION  09/16/2012   Procedure: TRANSESOPHAGEAL ECHOCARDIOGRAM (TEE);  Surgeon: Sanda Klein, MD;  Location: [facility] ENDOSCOPY;  Service: Cardiovascular;  Laterality: N/A;  . TRANSESOPHAGEAL ECHOCARDIOGRAM WITH CARDIOVERSION  09/16/2012   EF 60-65%, moderate LVH, moderate regurg of the aortic valve, LA moderately dilated    There were no vitals filed for this visit.              Wound Therapy - 09/08/17 1112    Subjective  Pt states that her hip is still hurting.  She went to the vascular surgeon  last week.  She is to go back in two weeks for some tests.     Patient and Family Stated Goals  Wounds to heal     Prior Treatments  self care, antibiotic     Pain Assessment  0-10    Pain Score  8     Pain Location  Hip    Pain Orientation  Right    Pain Descriptors / Indicators  Aching;Discomfort    Pain Onset  On-going    Patients Stated Pain Goal  0    Pain Intervention(s)  Emotional support    Wound Properties Date First Assessed: 06/25/17 Time First Assessed: 1100 Wound Type: Non-pressure wound Location: Leg Location Orientation: Posterior;Medial;Right Wound Description (Comments): blister   Dressing Type  Gauze (Comment);Impregnated gauze (bismuth)    Dressing Changed  Changed    Dressing Status  Old drainage    Dressing Change Frequency  PRN    Site /  Wound Assessment  Pink;Yellow    % Wound base Red or Granulating  50%    % Wound base Yellow/Fibrinous Exudate  50%    Peri-wound Assessment  Intact    Wound Length (cm)  1 cm was .9    Wound Width (cm)  1.2 cm was 1.1    Wound Depth (cm)  0.4 cm was .3    Wound Volume (cm^3)  0.48 cm^3    Wound Surface Area (cm^2)  1.2 cm^2    Drainage Amount  Scant less drainage and thinner compared to prior session    Treatment  Cleansed;Debridement (Selective)    Wound Properties Date First Assessed: 06/04/17 Time First Assessed: 3244 Wound Type: Non-pressure wound Location: Leg Location Orientation: Left;Lateral;Lower Present on Admission: Yes   Dressing Type  Gauze (Comment);Impregnated gauze (bismuth)    Dressing Status  Dry    Dressing Change Frequency  PRN    Site / Wound Assessment  Dry    % Wound base Red or Granulating  80% was 70%    % Wound base Yellow/Fibrinous Exudate  20% was 30%    Peri-wound Assessment  Intact    Wound Length (cm)  1.1 cm was 1.2    Wound Width (cm)  0.3 cm was .8    Wound Depth (cm)  0.2 cm    Wound Volume (cm^3)  0.07 cm^3    Wound Surface Area (cm^2)  0.33 cm^2    Drainage Amount  Scant    Drainage Description  Serous    Treatment  Cleansed;Debridement (Selective)    Selective Debridement - Location  both wounds     Selective Debridement - Tools Used  Forceps;Scalpel    Selective Debridement - Tissue Removed  slough    Wound Therapy - Clinical Statement  Lt LE wound continues to heal.  Pt Rt LE wound is improving in granulation but continues to increase in size.  Rt LE is very cold to touch with decreased pedal pulse.      Wound Therapy - Functional Problem List  difficulty in walking     Factors Delaying/Impairing Wound Healing  Altered sensation;Multiple medical problems;Vascular compromise    Hydrotherapy Plan  Debridement;Dressing change;Patient/family education    Wound Plan  Continue with wound care.  Remeasure weekly.    Dressing   both wounds xeroform  followed by gauze and tape                 PT Short Term Goals - 08/27/17 1325      PT  SHORT TERM GOAL #1   Title  Pt to understand signs and symptoms of cellulits and the importance of contacting MD if this occurs     Time  1    Period  Weeks    Status  Achieved      PT SHORT TERM GOAL #2   Title  PT lateral wound to be 75% granulated and to have no black eschar to reduce the risk of infection.    Baseline  11/29 - 65% currently no black eschar; 60% granulated     Time  2    Period  Weeks    Status  On-going      PT SHORT TERM GOAL #3   Title  Pt pain to be decreased to no greater than a 4/10 to allow pt to rest better    Baseline  -- 07/09/2017:  pain is no greater than a 4/10 except when debriding.  Pt unable to walk 10 minutes due to CHF revised goal to be so that pt can rest better.     Time  3    Period  Weeks    Status  Revised        PT Long Term Goals - 08/27/17 1326      PT LONG TERM GOAL #1   Title  Pt wound to be 100% granulated to prevent infection     Baseline  11/29 - lateral LLE 65%, posterior RLE - 30%    Time  4    Period  Weeks    Status  On-going      PT LONG TERM GOAL #2   Title  PT lateral  wound size to be no greater than 2x2 to allow pt to feel comfortable in self care of wound ; anterior wound to be healed     Baseline  11/29 - 1.6cm x 1.1 cm    Time  6    Period  Weeks    Status  On-going      PT LONG TERM GOAL #3   Title  If profore felt comfortable to pt pt to be educated in compression garments and why they may be benefical for pt.  Pt has had wounds come up on her Rt leg in the past as well     Baseline  -- 07/09/2017:  Await result of ABI    Time  6    Period  Weeks    Status  Deferred            Plan - 09/08/17 1118    Clinical Impression Statement  as above     Rehab Potential  Good    PT Frequency  2x / week    PT Duration  6 weeks    PT Treatment/Interventions  Other (comment) debridement and dressing change      PT Next Visit Plan  continue to remeasure wounds on Tuesdays.     Consulted and Agree with Plan of Care  Patient       Patient will benefit from skilled therapeutic intervention in order to improve the following deficits and impairments:  Decreased activity tolerance, Increased edema, Difficulty walking, Pain  Visit Diagnosis: Pain in left leg  Leg wound, left, subsequent encounter  Leg wound, right, subsequent encounter  Difficulty in walking, not elsewhere classified     Problem List Patient Active Problem List   Diagnosis Date Noted  . Iron deficiency anemia 08/07/2017  . Heme positive stool 10/04/2015  . Mixed hyperlipidemia  06/29/2015  . Type 2 diabetes mellitus with stage 4 chronic kidney disease ([place]) 06/29/2015  . Encounter for adequacy testing for hemodialysis ([place]) 04/12/2014  . Chronic kidney disease, stage V ([place]) 04/12/2014  . 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 Encounter for therapeutic drug monitoring 03/30/2014  . Chronic diastolic heart failure ([place]) 03/15/2014  . Renal failure (ARF), acute on chronic (HCC) 03/13/2014  . Anemia of chronic renal failure, stage 4 (severe) ([place]) 02/23/2014  . Folate deficiency 02/17/2014  . Aortic regurgitation 09/19/2012  . Atrial flutter ([place]) 09/14/2012  . Class 2 severe obesity due to excess calories with serious comorbidity and body mass index (BMI) of 35.0 to 35.9 in adult ([place]) 09/14/2012  . Type 2 diabetes mellitus with diabetic neuropathy ([place]) 09/14/2012  . SLE (systemic lupus erythematosus) ([place]) 09/14/2012  . Essential hypertension, benign 09/14/2012  . COPD (chronic obstructive pulmonary disease) ([place]) 09/14/2012   Rayetta Humphrey, PT CLT [phone] 09/08/2017, 11:19 AM  [place] [address] [place], [place], [address] Phone: [phone]   Fax:  [phone]  Name: [name] MRN:  [id number] Date of Birth: August 05, 1946

## 2017-09-09 ENCOUNTER — Other Ambulatory Visit (HOSPITAL_COMMUNITY): Payer: Self-pay | Admitting: Internal Medicine

## 2017-09-09 ENCOUNTER — Encounter (HOSPITAL_COMMUNITY): Payer: Medicare Other

## 2017-09-09 ENCOUNTER — Ambulatory Visit (HOSPITAL_COMMUNITY)
Admission: RE | Admit: 2017-09-09 | Discharge: 2017-09-09 | Disposition: A | Discharge status: Home / Self Care | Payer: Medicare Other | Source: Ambulatory Visit | Attending: Internal Medicine | Admitting: Internal Medicine

## 2017-09-09 ENCOUNTER — Encounter (HOSPITAL_COMMUNITY): Payer: Medicare Other | Attending: Oncology

## 2017-09-09 DIAGNOSIS — N184 Chronic kidney disease, stage 4 (severe): Secondary | ICD-10-CM | POA: Diagnosis not present

## 2017-09-09 DIAGNOSIS — R1032 Left lower quadrant pain: Secondary | ICD-10-CM | POA: Diagnosis not present

## 2017-09-09 DIAGNOSIS — N390 Urinary tract infection, site not specified: Secondary | ICD-10-CM | POA: Diagnosis not present

## 2017-09-09 DIAGNOSIS — I10 Essential (primary) hypertension: Secondary | ICD-10-CM | POA: Diagnosis not present

## 2017-09-09 DIAGNOSIS — L03119 Cellulitis of unspecified part of limb: Secondary | ICD-10-CM | POA: Diagnosis not present

## 2017-09-09 DIAGNOSIS — R1084 Generalized abdominal pain: Secondary | ICD-10-CM

## 2017-09-09 DIAGNOSIS — D631 Anemia in chronic kidney disease: Secondary | ICD-10-CM

## 2017-09-09 DIAGNOSIS — M329 Systemic lupus erythematosus, unspecified: Secondary | ICD-10-CM | POA: Diagnosis not present

## 2017-09-09 DIAGNOSIS — R109 Unspecified abdominal pain: Secondary | ICD-10-CM | POA: Diagnosis not present

## 2017-09-09 LAB — CBC WITH DIFFERENTIAL/PLATELET
BASOS ABS: 0 10*3/uL (ref 0.0–0.1)
Basophils Relative: 0 %
EOS PCT: 1 %
Eosinophils Absolute: 0.1 10*3/uL (ref 0.0–0.7)
HEMATOCRIT: 39 % (ref 36.0–46.0)
HEMOGLOBIN: 11.1 g/dL — AB (ref 12.0–15.0)
LYMPHS ABS: 0.9 10*3/uL (ref 0.7–4.0)
LYMPHS PCT: 11 %
MCH: 26.7 pg (ref 26.0–34.0)
MCHC: 28.5 g/dL — AB (ref 30.0–36.0)
MCV: 94 fL (ref 78.0–100.0)
MONOS PCT: 8 %
Monocytes Absolute: 0.6 10*3/uL (ref 0.1–1.0)
NEUTROS ABS: 6.5 10*3/uL (ref 1.7–7.7)
Neutrophils Relative %: 80 %
PLATELETS: 186 10*3/uL (ref 150–400)
RBC: 4.15 MIL/uL (ref 3.87–5.11)
RDW: 21.9 % — AB (ref 11.5–15.5)
WBC: 8.1 10*3/uL (ref 4.0–10.5)

## 2017-09-09 NOTE — Progress Notes (Signed)
Hgb 11.1 today. Aranesp injection held per parameters. Pt given this information and verbalized understanding

## 2017-09-10 ENCOUNTER — Ambulatory Visit (HOSPITAL_COMMUNITY): Payer: Medicare Other | Admitting: Physical Therapy

## 2017-09-10 DIAGNOSIS — S81801D Unspecified open wound, right lower leg, subsequent encounter: Secondary | ICD-10-CM | POA: Diagnosis not present

## 2017-09-10 DIAGNOSIS — M79605 Pain in left leg: Secondary | ICD-10-CM

## 2017-09-10 DIAGNOSIS — R262 Difficulty in walking, not elsewhere classified: Secondary | ICD-10-CM

## 2017-09-10 DIAGNOSIS — S81802D Unspecified open wound, left lower leg, subsequent encounter: Secondary | ICD-10-CM

## 2017-09-10 NOTE — Therapy (Signed)
[place], [place], [address] Phone: [phone]   Fax:  [phone]  Wound Care Therapy  Patient Details  Name: [name] MRN: [id number] Date of Birth: [birth year] Referring Provider: Sharilyn Sites   Encounter Date: 09/10/2017  PT End of Session - 09/10/17 1223    Visit Number  26    Number of Visits  35    Date for PT Re-Evaluation  10/02/17    Authorization Type  [employer] medicare  cert 88/4-16/60, gcodes done visit #23    Authorization - Visit Number  26    Authorization - Number of Visits  35    PT Start Time  1125    PT Stop Time  1155    PT Time Calculation (min)  30 min    Activity Tolerance  Patient tolerated treatment well    Behavior During Therapy  [facility] for tasks assessed/performed       Past Medical History:  Diagnosis Date  . Anemia   . Arthritis   . Asthma   . Atrial fibrillation ([place])   . Atrial flutter [facility])    TEE/DCCV December 2013 - on Xarelto [facility])  . Bone spur of toe    Right 5th toe  . Chronic diastolic CHF (congestive heart failure) (HCC)    a. EF 50-55% by echo in 2015  . CKD (chronic kidney disease) stage 4, GFR 15-29 ml/min (HCC)    a. s/p fistula placement but not on HD. Followed by Nephrology.   Marland Kitchen COPD (chronic obstructive pulmonary disease) ([place])   . DVT of upper extremity (deep vein thrombosis) ([place])    Right basilic vein, June 6301  . Essential hypertension, benign   . Fractures   . SLE (systemic lupus erythematosus) ([place])   . Type 2 diabetes mellitus ([place])   . UTI (lower urinary tract infection)     Past Surgical History:  Procedure Laterality Date  . ABDOMINAL HYSTERECTOMY  1983  . ANKLE SURGERY  1993  . AV FISTULA PLACEMENT Left 03/27/2014   Procedure: ARTERIOVENOUS (AV) FISTULA CREATION;  Surgeon: Rosetta Posner, MD;  Location: [place];  Service: Vascular;  Laterality: Left;  . BACK SURGERY  1980  . CARDIOVASCULAR STRESS TEST  12/19/2009   no stress induced  rhythm abnormalities, ekg negative for ischemia  . CARDIOVERSION  09/16/2012   Procedure: CARDIOVERSION;  Surgeon: Sanda Klein, MD;  Location: [facility] ENDOSCOPY;  Service: Cardiovascular;  Laterality: N/A;  . COLONOSCOPY  09/20/2012   Dr. [name]: multiple tubular adenomas   . COLONOSCOPY  2005   Dr. [name]. Polyps, path unknown   . COLONOSCOPY N/A 07/24/2016   Procedure: COLONOSCOPY;  Surgeon: [name], MD;  [facility];  Service: Endoscopy;  Laterality: N/A;  9:00 am  . ESOPHAGOGASTRODUODENOSCOPY  09/20/2012   Dr. [name]: duodenal erosion and possible resolving ulcer at the angularis   . TEE WITHOUT CARDIOVERSION  09/16/2012   Procedure: TRANSESOPHAGEAL ECHOCARDIOGRAM (TEE);  Surgeon: Sanda Klein, MD;  Location: [facility] ENDOSCOPY;  Service: Cardiovascular;  Laterality: N/A;  . TRANSESOPHAGEAL ECHOCARDIOGRAM WITH CARDIOVERSION  09/16/2012   EF 60-65%, moderate LVH, moderate regurg of the aortic valve, LA moderately dilated    There were no vitals filed for this visit.              Wound Therapy - 09/10/17 1218    Subjective  Pt hip is hurting but not as bad as it was.  No issues with  LE's, however discussed with spouse and he states they did not give them a stent option, only a choice between dialysis or amputation.  They return to MD in 2 weeks.    Pain Assessment  0-10    Pain Score  0-No pain    Wound Properties Date First Assessed: 06/25/17 Time First Assessed: 1100 Wound Type: Non-pressure wound Location: Leg Location Orientation: Posterior;Medial;Right Wound Description (Comments): blister   Dressing Type  Gauze (Comment);Impregnated gauze (bismuth)    Dressing Changed  Changed    Dressing Status  Old drainage    Dressing Change Frequency  PRN    Site / Wound Assessment  Pink;Yellow    % Wound base Red or Granulating  50%    % Wound base Yellow/Fibrinous Exudate  50%    Peri-wound Assessment  Intact    Drainage Amount  Scant    Treatment   Cleansed;Debridement (Selective)    Wound Properties Date First Assessed: 06/04/17 Time First Assessed: 1437 Wound Type: Non-pressure wound Location: Leg Location Orientation: Left;Lateral;Lower Present on Admission: Yes   Dressing Type  Gauze (Comment);Impregnated gauze (bismuth)    Dressing Changed  Changed    Dressing Status  Dry    Dressing Change Frequency  PRN    Site / Wound Assessment  Dry    % Wound base Red or Granulating  80%    % Wound base Yellow/Fibrinous Exudate  20%    Peri-wound Assessment  Intact    Drainage Amount  Scant    Drainage Description  Serous    Treatment  Cleansed;Debridement (Selective)    Selective Debridement - Location  both wounds     Selective Debridement - Tools Used  Forceps;Scalpel    Selective Debridement - Tissue Removed  slough    Wound Therapy - Clinical Statement  No significant change in Rt LE, Lt is healing nicely.  Callous removed and drained on Rt small toe by MD so placed a dressing over (did not have one) to prevent infection.  Edcuated pateint to keep it covered as well.     Wound Plan  Continue with wound care.  Remeasure weekly.    Dressing   both wounds xeroform followed by gauze and tape                 PT Short Term Goals - 08/27/17 1325      PT SHORT TERM GOAL #1   Title  Pt to understand signs and symptoms of cellulits and the importance of contacting MD if this occurs     Time  1    Period  Weeks    Status  Achieved      PT SHORT TERM GOAL #2   Title  PT lateral wound to be 75% granulated and to have no black eschar to reduce the risk of infection.    Baseline  11/29 - 65% currently no black eschar; 60% granulated     Time  2    Period  Weeks    Status  On-going      PT SHORT TERM GOAL #3   Title  Pt pain to be decreased to no greater than a 4/10 to allow pt to rest better    Baseline  -- 07/09/2017:  pain is no greater than a 4/10 except when debriding.  Pt unable to walk 10 minutes due to CHF revised goal to  be so that pt can rest better.     Time  3  Period  Weeks    Status  Revised        PT Long Term Goals - 08/27/17 1326      PT LONG TERM GOAL #1   Title  Pt wound to be 100% granulated to prevent infection     Baseline  11/29 - lateral LLE 65%, posterior RLE - 30%    Time  4    Period  Weeks    Status  On-going      PT LONG TERM GOAL #2   Title  PT lateral  wound size to be no greater than 2x2 to allow pt to feel comfortable in self care of wound ; anterior wound to be healed     Baseline  11/29 - 1.6cm x 1.1 cm    Time  6    Period  Weeks    Status  On-going      PT LONG TERM GOAL #3   Title  If profore felt comfortable to pt pt to be educated in compression garments and why they may be benefical for pt.  Pt has had wounds come up on her Rt leg in the past as well     Baseline  -- 07/09/2017:  Await result of ABI    Time  6    Period  Weeks    Status  Deferred              Patient will benefit from skilled therapeutic intervention in order to improve the following deficits and impairments:     Visit Diagnosis: Pain in left leg  Leg wound, left, subsequent encounter  Leg wound, right, subsequent encounter  Difficulty in walking, not elsewhere classified     Problem List Patient Active Problem List   Diagnosis Date Noted  . Iron deficiency anemia 08/07/2017  . Heme positive stool 10/04/2015  . Mixed hyperlipidemia 06/29/2015  . Type 2 diabetes mellitus with stage 4 chronic kidney disease ([place]) 06/29/2015  . Encounter for adequacy testing for hemodialysis ([place]) 04/12/2014  . Chronic kidney disease, stage V ([place]) 04/12/2014  . 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 Encounter for therapeutic drug monitoring 03/30/2014  . Chronic diastolic heart failure ([place]) 03/15/2014  . Renal failure (ARF), acute on chronic (HCC) 03/13/2014  . Anemia of chronic renal failure, stage 4 (severe) ([place]) 02/23/2014   . Folate deficiency 02/17/2014  . Aortic regurgitation 09/19/2012  . Atrial flutter ([place]) 09/14/2012  . Class 2 severe obesity due to excess calories with serious comorbidity and body mass index (BMI) of 35.0 to 35.9 in adult ([place]) 09/14/2012  . Type 2 diabetes mellitus with diabetic neuropathy ([place]) 09/14/2012  . SLE (systemic lupus erythematosus) ([place]) 09/14/2012  . Essential hypertension, benign 09/14/2012  . COPD (chronic obstructive pulmonary disease) ([place]) 09/14/2012   Teena Irani, PTA/CLT [phone]  Teena Irani 09/10/2017, 12:24 PM  [facility] [address] [place], [place], [address] Phone: [phone]   Fax:  [phone]  Name: LYLLA EIFLER MRN: [id number] Date of Birth: 1945/10/20

## 2017-09-15 ENCOUNTER — Ambulatory Visit (HOSPITAL_COMMUNITY): Payer: Medicare Other | Admitting: Physical Therapy

## 2017-09-15 ENCOUNTER — Other Ambulatory Visit (HOSPITAL_COMMUNITY): Payer: Self-pay | Admitting: *Deleted

## 2017-09-15 ENCOUNTER — Telehealth (HOSPITAL_COMMUNITY): Payer: Self-pay | Admitting: Family Medicine

## 2017-09-15 DIAGNOSIS — S81802D Unspecified open wound, left lower leg, subsequent encounter: Secondary | ICD-10-CM | POA: Diagnosis not present

## 2017-09-15 DIAGNOSIS — N184 Chronic kidney disease, stage 4 (severe): Principal | ICD-10-CM

## 2017-09-15 DIAGNOSIS — E1140 Type 2 diabetes mellitus with diabetic neuropathy, unspecified: Secondary | ICD-10-CM | POA: Diagnosis not present

## 2017-09-15 DIAGNOSIS — M79605 Pain in left leg: Secondary | ICD-10-CM | POA: Diagnosis not present

## 2017-09-15 DIAGNOSIS — S81801D Unspecified open wound, right lower leg, subsequent encounter: Secondary | ICD-10-CM

## 2017-09-15 DIAGNOSIS — L89892 Pressure ulcer of other site, stage 2: Secondary | ICD-10-CM | POA: Diagnosis not present

## 2017-09-15 DIAGNOSIS — R262 Difficulty in walking, not elsewhere classified: Secondary | ICD-10-CM | POA: Diagnosis not present

## 2017-09-15 DIAGNOSIS — E1151 Type 2 diabetes mellitus with diabetic peripheral angiopathy without gangrene: Secondary | ICD-10-CM | POA: Diagnosis not present

## 2017-09-15 DIAGNOSIS — D631 Anemia in chronic kidney disease: Secondary | ICD-10-CM

## 2017-09-15 NOTE — Therapy (Signed)
[place], [place], [address] Phone: [phone]   Fax:  [phone]  Wound Care Therapy  Patient Details  Name: [name] MRN: [id number] Date of Birth: 1946-07-01 Referring Provider: Sharilyn Sites   Encounter Date: 09/15/2017  PT End of Session - 09/15/17 1201    Visit Number  27    Number of Visits  35    Date for PT Re-Evaluation  10/02/17    Authorization Type  U[employer] medicare  cert 59/5-63/87, gcodes done visit #23    Authorization - Visit Number  89    Authorization - Number of Visits  35    PT Start Time  1115    PT Stop Time  1145    PT Time Calculation (min)  30 min    Activity Tolerance  Patient tolerated treatment well    Behavior During Therapy  [facility] for tasks assessed/performed       Past Medical History:  Diagnosis Date  . Anemia   . Arthritis   . Asthma   . Atrial fibrillation ([place])   . Atrial flutter [facility])    TEE/DCCV December 2013 - on Xarelto [facility])  . Bone spur of toe    Right 5th toe  . Chronic diastolic CHF (congestive heart failure) (HCC)    a. EF 50-55% by echo in 2015  . CKD (chronic kidney disease) stage 4, GFR 15-29 ml/min (HCC)    a. s/p fistula placement but not on HD. Followed by Nephrology.   Marland Kitchen COPD (chronic obstructive pulmonary disease) ([place])   . DVT of upper extremity (deep vein thrombosis) ([place])    Right basilic vein, June 5643  . Essential hypertension, benign   . Fractures   . SLE (systemic lupus erythematosus) ([place])   . Type 2 diabetes mellitus ([place])   . UTI (lower urinary tract infection)     Past Surgical History:  Procedure Laterality Date  . ABDOMINAL HYSTERECTOMY  1983  . ANKLE SURGERY  1993  . AV FISTULA PLACEMENT Left 03/27/2014   Procedure: ARTERIOVENOUS (AV) FISTULA CREATION;  Surgeon: Rosetta Posner, MD;  Location: [place];  Service: Vascular;  Laterality: Left;  . BACK SURGERY  1980  . CARDIOVASCULAR STRESS TEST  12/19/2009   no stress induced  rhythm abnormalities, ekg negative for ischemia  . CARDIOVERSION  09/16/2012   Procedure: CARDIOVERSION;  Surgeon: Sanda Klein, MD;  Location: [facility] ENDOSCOPY;  Service: Cardiovascular;  Laterality: N/A;  . COLONOSCOPY  09/20/2012   Dr. [name]: multiple tubular adenomas   . COLONOSCOPY  2005   Dr. [name]. Polyps, path unknown   . COLONOSCOPY N/A 07/24/2016   Procedure: COLONOSCOPY;  Surgeon: [name], MD;  [facility];  Service: Endoscopy;  Laterality: N/A;  9:00 am  . ESOPHAGOGASTRODUODENOSCOPY  09/20/2012   Dr. [name]: duodenal erosion and possible resolving ulcer at the angularis   . TEE WITHOUT CARDIOVERSION  09/16/2012   Procedure: TRANSESOPHAGEAL ECHOCARDIOGRAM (TEE);  Surgeon: Sanda Klein, MD;  Location: [facility] ENDOSCOPY;  Service: Cardiovascular;  Laterality: N/A;  . TRANSESOPHAGEAL ECHOCARDIOGRAM WITH CARDIOVERSION  09/16/2012   EF 60-65%, moderate LVH, moderate regurg of the aortic valve, LA moderately dilated    There were no vitals filed for this visit.              Wound Therapy - 09/15/17 1153    Subjective  Pt states that her wound is not hurting right now but sometimes the right LE  wound will pain her as high as an 8/10;  right now it is her right hip that is bothering her and she has an appointment with the orthopedic MD tomorrow..     Patient and Family Stated Goals  wounds to heal     Prior Treatments  self care and antibiotid    Pain Assessment  0-10    Pain Score  7     Pain Location  Hip    Pain Orientation  Right    Pain Descriptors / Indicators  Aching    Pain Onset  On-going    Pain Intervention(s)  Emotional support we are not seeing pt for her hip.      Wound Properties Date First Assessed: 06/25/17 Time First Assessed: 1100 Wound Type: Non-pressure wound Location: Leg Location Orientation: Posterior;Medial;Right Wound Description (Comments): blister   Dressing Type  Gauze (Comment);Impregnated gauze (bismuth)    Dressing  Changed  Changed    Dressing Status  Old drainage    Dressing Change Frequency  PRN    Site / Wound Assessment  Pink;Yellow    % Wound base Red or Granulating  80%    % Wound base Yellow/Fibrinous Exudate  20%    Peri-wound Assessment  Intact    Wound Length (cm)  1 cm    Wound Width (cm)  1.1 cm    Wound Depth (cm)  0.3 cm    Wound Volume (cm^3)  0.33 cm^3    Wound Surface Area (cm^2)  1.1 cm^2    Drainage Amount  Scant    Treatment  Cleansed;Debridement (Selective)    Wound Properties Date First Assessed: 06/04/17 Time First Assessed: 0076 Wound Type: Non-pressure wound Location: Leg Location Orientation: Left;Lateral;Lower Present on Admission: Yes   Dressing Type  Gauze (Comment);Impregnated gauze (bismuth)    Dressing Status  Dry    Dressing Change Frequency  PRN    Site / Wound Assessment  Dry    % Wound base Red or Granulating  90%    % Wound base Yellow/Fibrinous Exudate  10%    Peri-wound Assessment  Intact    Wound Length (cm)  1.1 cm    Wound Width (cm)  0.3 cm    Wound Depth (cm)  0.2 cm    Wound Volume (cm^3)  0.07 cm^3    Wound Surface Area (cm^2)  0.33 cm^2    Drainage Amount  Scant    Drainage Description  Serous    Treatment  Cleansed;Debridement (Selective)    Selective Debridement - Location  both wounds     Selective Debridement - Tools Used  Forceps    Selective Debridement - Tissue Removed  slough    Wound Therapy - Clinical Statement  Pt wounds are remaining granulated.  Due to poor circulation and multiple medical problems pt wounds will heal slowly.  Therapist feels comfortable decreasing pt to one time a week at this time.      Factors Delaying/Impairing Wound Healing  Altered sensation;Multiple medical problems;Vascular compromise    Wound Plan  Decrease wound care to one time a week.  Remeasure weekly.    Dressing   both wounds xeroform followed by gauze and tape                 PT Short Term Goals - 08/27/17 1325      PT SHORT TERM GOAL  #1   Title  Pt to understand signs and symptoms of cellulits and the importance of contacting MD if  this occurs     Time  1    Period  Weeks    Status  Achieved      PT SHORT TERM GOAL #2   Title  PT lateral wound to be 75% granulated and to have no black eschar to reduce the risk of infection.    Baseline  11/29 - 65% currently no black eschar; 60% granulated     Time  2    Period  Weeks    Status  On-going      PT SHORT TERM GOAL #3   Title  Pt pain to be decreased to no greater than a 4/10 to allow pt to rest better    Baseline  -- 07/09/2017:  pain is no greater than a 4/10 except when debriding.  Pt unable to walk 10 minutes due to CHF revised goal to be so that pt can rest better.     Time  3    Period  Weeks    Status  Revised        PT Long Term Goals - 08/27/17 1326      PT LONG TERM GOAL #1   Title  Pt wound to be 100% granulated to prevent infection     Baseline  11/29 - lateral LLE 65%, posterior RLE - 30%    Time  4    Period  Weeks    Status  On-going      PT LONG TERM GOAL #2   Title  PT lateral  wound size to be no greater than 2x2 to allow pt to feel comfortable in self care of wound ; anterior wound to be healed     Baseline  11/29 - 1.6cm x 1.1 cm    Time  6    Period  Weeks    Status  On-going      PT LONG TERM GOAL #3   Title  If profore felt comfortable to pt pt to be educated in compression garments and why they may be benefical for pt.  Pt has had wounds come up on her Rt leg in the past as well     Baseline  -- 07/09/2017:  Await result of ABI    Time  6    Period  Weeks    Status  Deferred            Plan - 09/15/17 1201    Clinical Impression Statement  as above    Rehab Potential  Good    PT Frequency  2x / week    PT Duration  6 weeks    PT Treatment/Interventions  Other (comment) debridement and dressing change     PT Next Visit Plan  continue to remeasure wounds weekly; decrease pt to one time a week     Consulted and Agree  with Plan of Care  Patient       Patient will benefit from skilled therapeutic intervention in order to improve the following deficits and impairments:  Decreased activity tolerance, Increased edema, Difficulty walking, Pain  Visit Diagnosis: Pain in left leg  Leg wound, left, subsequent encounter  Leg wound, right, subsequent encounter  Difficulty in walking, not elsewhere classified     Problem List Patient Active Problem List   Diagnosis Date Noted  . Iron deficiency anemia 08/07/2017  . Heme positive stool 10/04/2015  . Mixed hyperlipidemia 06/29/2015  . Type 2 diabetes mellitus with stage 4 chronic kidney disease ([place]) 06/29/2015  . Encounter  for adequacy testing for hemodialysis ([place]) 04/12/2014  . Chronic kidney disease, stage V ([employer]) 04/12/2014  . 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 Encounter for therapeutic drug monitoring 03/30/2014  . Chronic diastolic heart failure ([place]) 03/15/2014  . Renal failure (ARF), acute on chronic (HCC) 03/13/2014  . Anemia of chronic renal failure, stage 4 (severe) ([place]) 02/23/2014  . Folate deficiency 02/17/2014  . Aortic regurgitation 09/19/2012  . Atrial flutter ([place]) 09/14/2012  . Class 2 severe obesity due to excess calories with serious comorbidity and body mass index (BMI) of 35.0 to 35.9 in adult ([place]) 09/14/2012  . Type 2 diabetes mellitus with diabetic neuropathy ([place]) 09/14/2012  . SLE (systemic lupus erythematosus) ([place]) 09/14/2012  . Essential hypertension, benign 09/14/2012  . COPD (chronic obstructive pulmonary disease) ([place]) 09/14/2012    Rayetta Humphrey, PT CLT [phone] 09/15/2017, 12:02 PM  [facility] [address] [place], [place], [address] Phone: [phone]   Fax:  [phone]  Name: [name] MRN: [id number] Date of Birth: 07-09-1946

## 2017-09-15 NOTE — Telephone Encounter (Signed)
09/15/17  husband wanted to schedule 1 x week so I cx this appt since she already came 12/18

## 2017-09-16 ENCOUNTER — Encounter (HOSPITAL_COMMUNITY): Payer: Medicare Other

## 2017-09-16 ENCOUNTER — Ambulatory Visit (INDEPENDENT_AMBULATORY_CARE_PROVIDER_SITE_OTHER): Payer: Medicare Other | Admitting: *Deleted

## 2017-09-16 DIAGNOSIS — I4892 Unspecified atrial flutter: Secondary | ICD-10-CM

## 2017-09-16 DIAGNOSIS — N184 Chronic kidney disease, stage 4 (severe): Secondary | ICD-10-CM | POA: Diagnosis not present

## 2017-09-16 DIAGNOSIS — Z5181 Encounter for therapeutic drug level monitoring: Secondary | ICD-10-CM

## 2017-09-16 DIAGNOSIS — D631 Anemia in chronic kidney disease: Secondary | ICD-10-CM

## 2017-09-16 LAB — CBC WITH DIFFERENTIAL/PLATELET
BASOS PCT: 0 %
Basophils Absolute: 0 10*3/uL (ref 0.0–0.1)
EOS ABS: 0 10*3/uL (ref 0.0–0.7)
EOS PCT: 1 %
HEMATOCRIT: 38.5 % (ref 36.0–46.0)
Hemoglobin: 11.3 g/dL — ABNORMAL LOW (ref 12.0–15.0)
Lymphocytes Relative: 15 %
Lymphs Abs: 1 10*3/uL (ref 0.7–4.0)
MCH: 26.6 pg (ref 26.0–34.0)
MCHC: 29.4 g/dL — AB (ref 30.0–36.0)
MCV: 90.6 fL (ref 78.0–100.0)
MONO ABS: 0 10*3/uL — AB (ref 0.1–1.0)
MONOS PCT: 0 %
NEUTROS ABS: 5.2 10*3/uL (ref 1.7–7.7)
Neutrophils Relative %: 84 %
Platelets: 206 10*3/uL (ref 150–400)
RBC: 4.25 MIL/uL (ref 3.87–5.11)
RDW: 20.5 % — AB (ref 11.5–15.5)
WBC: 6.2 10*3/uL (ref 4.0–10.5)

## 2017-09-16 LAB — POCT INR: INR: 2.2

## 2017-09-16 NOTE — Progress Notes (Signed)
Hemogobin 11.3 , will hold aranesp today per protocol. Follow up as scheduled.

## 2017-09-17 ENCOUNTER — Ambulatory Visit (HOSPITAL_COMMUNITY): Payer: Medicare Other | Admitting: Physical Therapy

## 2017-09-17 ENCOUNTER — Ambulatory Visit (INDEPENDENT_AMBULATORY_CARE_PROVIDER_SITE_OTHER): Payer: Medicare Other | Admitting: Orthopaedic Surgery

## 2017-09-17 ENCOUNTER — Encounter: Payer: Self-pay | Admitting: Orthopaedic Surgery

## 2017-09-17 VITALS — BP 121/78 | HR 81 | Temp 97.6°F | Ht 64.0 in | Wt 189.0 lb

## 2017-09-17 DIAGNOSIS — Z7901 Long term (current) use of anticoagulants: Secondary | ICD-10-CM | POA: Diagnosis not present

## 2017-09-17 DIAGNOSIS — N390 Urinary tract infection, site not specified: Secondary | ICD-10-CM | POA: Diagnosis not present

## 2017-09-17 DIAGNOSIS — R109 Unspecified abdominal pain: Secondary | ICD-10-CM | POA: Diagnosis not present

## 2017-09-17 DIAGNOSIS — Z794 Long term (current) use of insulin: Secondary | ICD-10-CM

## 2017-09-17 DIAGNOSIS — M7061 Trochanteric bursitis, right hip: Secondary | ICD-10-CM

## 2017-09-17 DIAGNOSIS — E1140 Type 2 diabetes mellitus with diabetic neuropathy, unspecified: Secondary | ICD-10-CM

## 2017-09-17 NOTE — Progress Notes (Signed)
PROCEDURE NOTE:  The patient request injection, verbal consent was obtained.  The right trochanteric area of the hip was prepped appropriately after time out was performed.   Sterile technique was observed and injection of 1 cc of Depo-Medrol 40 mg with several cc's of plain xylocaine. Anesthesia was provided by ethyl chloride and a 20-gauge needle was used to inject the hip area. The injection was tolerated well.  A band aid dressing was applied.  The patient was advised to apply ice later today and tomorrow to the injection sight as needed.  Return in one month.  Call if any problem.  Precautions discussed.   Electronically Signed Sanjuana Kava, MD 12/20/20189:54 AM

## 2017-09-21 ENCOUNTER — Encounter: Payer: Self-pay | Admitting: Internal Medicine

## 2017-09-21 ENCOUNTER — Ambulatory Visit (HOSPITAL_COMMUNITY): Payer: Medicare Other | Admitting: Physical Therapy

## 2017-09-21 DIAGNOSIS — M79605 Pain in left leg: Secondary | ICD-10-CM

## 2017-09-21 DIAGNOSIS — S81801D Unspecified open wound, right lower leg, subsequent encounter: Secondary | ICD-10-CM | POA: Diagnosis not present

## 2017-09-21 DIAGNOSIS — R262 Difficulty in walking, not elsewhere classified: Secondary | ICD-10-CM | POA: Diagnosis not present

## 2017-09-21 DIAGNOSIS — S81802D Unspecified open wound, left lower leg, subsequent encounter: Secondary | ICD-10-CM | POA: Diagnosis not present

## 2017-09-21 NOTE — Therapy (Signed)
[place], [place], [address] Phone: [phone]   Fax:  [phone]  Wound Care Therapy  Patient Details  Name: [name] MRN: [id number] Date of Birth: May 03, 1946 Referring Provider: Sharilyn Sites   Encounter Date: 09/21/2017  PT End of Session - 09/21/17 1110    Visit Number  28    Number of Visits  35    Date for PT Re-Evaluation  10/02/17    Authorization Type  [employer] medicare  cert 37/1-06/26, gcodes done visit #23    Authorization - Visit Number  28    Authorization - Number of Visits  35    PT Start Time  1038    PT Stop Time  1100    PT Time Calculation (min)  22 min    Activity Tolerance  Patient tolerated treatment well    Behavior During Therapy  [facility] for tasks assessed/performed       Past Medical History:  Diagnosis Date  . Anemia   . Arthritis   . Asthma   . Atrial fibrillation ([place])   . Atrial flutter [facility])    TEE/DCCV December 2013 - on Xarelto [facility])  . Bone spur of toe    Right 5th toe  . Chronic diastolic CHF (congestive heart failure) (HCC)    a. EF 50-55% by echo in 2015  . CKD (chronic kidney disease) stage 4, GFR 15-29 ml/min (HCC)    a. s/p fistula placement but not on HD. Followed by Nephrology.   Marland Kitchen COPD (chronic obstructive pulmonary disease) ([place])   . DVT of upper extremity (deep vein thrombosis) ([place])    Right basilic vein, June 9485  . Essential hypertension, benign   . Fractures   . SLE (systemic lupus erythematosus) ([place])   . Type 2 diabetes mellitus ([place])   . UTI (lower urinary tract infection)     Past Surgical History:  Procedure Laterality Date  . ABDOMINAL HYSTERECTOMY  1983  . ANKLE SURGERY  1993  . AV FISTULA PLACEMENT Left 03/27/2014   Procedure: ARTERIOVENOUS (AV) FISTULA CREATION;  Surgeon: Rosetta Posner, MD;  Location: [place];  Service: Vascular;  Laterality: Left;  . BACK SURGERY  1980  . CARDIOVASCULAR STRESS TEST  12/19/2009   no stress induced  rhythm abnormalities, ekg negative for ischemia  . CARDIOVERSION  09/16/2012   Procedure: CARDIOVERSION;  Surgeon: Sanda Klein, MD;  Location: [facility] ENDOSCOPY;  Service: Cardiovascular;  Laterality: N/A;  . COLONOSCOPY  09/20/2012   Dr. [name]: multiple tubular adenomas   . COLONOSCOPY  2005   Dr. [name]. Polyps, path unknown   . COLONOSCOPY N/A 07/24/2016   Procedure: COLONOSCOPY;  Surgeon: [name], MD;  [facility];  Service: Endoscopy;  Laterality: N/A;  9:00 am  . ESOPHAGOGASTRODUODENOSCOPY  09/20/2012   Dr. [name]: duodenal erosion and possible resolving ulcer at the angularis   . TEE WITHOUT CARDIOVERSION  09/16/2012   Procedure: TRANSESOPHAGEAL ECHOCARDIOGRAM (TEE);  Surgeon: Sanda Klein, MD;  Location: [facility] ENDOSCOPY;  Service: Cardiovascular;  Laterality: N/A;  . TRANSESOPHAGEAL ECHOCARDIOGRAM WITH CARDIOVERSION  09/16/2012   EF 60-65%, moderate LVH, moderate regurg of the aortic valve, LA moderately dilated    There were no vitals filed for this visit.              Wound Therapy - 09/21/17 1107    Subjective  Pt states she got another injection in her hip and it is still hurting.  States her legs are not really bothering her today    Patient and Family Stated Goals  wounds to heal     Prior Treatments  self care and antibiotid    Pain Assessment  No/denies pain    Wound Properties Date First Assessed: 06/25/17 Time First Assessed: 1100 Wound Type: Non-pressure wound Location: Leg Location Orientation: Posterior;Medial;Right Wound Description (Comments): blister   Dressing Type  Gauze (Comment);Impregnated gauze (bismuth)    Dressing Changed  Changed    Dressing Status  Old drainage    Dressing Change Frequency  PRN    Site / Wound Assessment  Pink;Yellow    % Wound base Red or Granulating  80%    % Wound base Yellow/Fibrinous Exudate  20%    Peri-wound Assessment  Intact    Wound Length (cm)  1 cm    Wound Width (cm)  1 cm    Wound Depth  (cm)  0.3 cm    Wound Volume (cm^3)  0.3 cm^3    Wound Surface Area (cm^2)  1 cm^2    Drainage Amount  Scant    Treatment  Cleansed;Debridement (Selective)    Wound Properties Date First Assessed: 06/04/17 Time First Assessed: 2956 Wound Type: Non-pressure wound Location: Leg Location Orientation: Left;Lateral;Lower Present on Admission: Yes   Dressing Type  Gauze (Comment);Impregnated gauze (bismuth)    Dressing Changed  Changed    Dressing Status  Dry    Dressing Change Frequency  PRN    Site / Wound Assessment  Dry    % Wound base Red or Granulating  90%    % Wound base Yellow/Fibrinous Exudate  10%    Peri-wound Assessment  Intact    Wound Length (cm)  0.9 cm    Wound Width (cm)  0.3 cm    Wound Depth (cm)  0.2 cm    Wound Volume (cm^3)  0.05 cm^3    Wound Surface Area (cm^2)  0.27 cm^2    Drainage Amount  Scant    Drainage Description  Serous    Treatment  Cleansed;Debridement (Selective)    Selective Debridement - Location  both wounds     Selective Debridement - Tools Used  Forceps    Selective Debridement - Tissue Removed  slough    Wound Therapy - Clinical Statement  Wounds remeasured with continued reduction in size and less discomfort in wound areas.  Hip is still bothering patient quite a bit, however.  Able to debride dry edges to promote approximation.  Moisturzed wound borders.    Factors Delaying/Impairing Wound Healing  Altered sensation;Multiple medical problems;Vascular compromise    Wound Plan  Continue woundcare 1X week with measurements.    Dressing   both wounds xeroform followed by gauze and tape                 PT Short Term Goals - 08/27/17 1325      PT SHORT TERM GOAL #1   Title  Pt to understand signs and symptoms of cellulits and the importance of contacting MD if this occurs     Time  1    Period  Weeks    Status  Achieved      PT SHORT TERM GOAL #2   Title  PT lateral wound to be 75% granulated and to have no black eschar to reduce the  risk of infection.    Baseline  11/29 - 65% currently no black eschar; 60% granulated     Time  2  Period  Weeks    Status  On-going      PT SHORT TERM GOAL #3   Title  Pt pain to be decreased to no greater than a 4/10 to allow pt to rest better    Baseline  -- 07/09/2017:  pain is no greater than a 4/10 except when debriding.  Pt unable to walk 10 minutes due to CHF revised goal to be so that pt can rest better.     Time  3    Period  Weeks    Status  Revised        PT Long Term Goals - 08/27/17 1326      PT LONG TERM GOAL #1   Title  Pt wound to be 100% granulated to prevent infection     Baseline  11/29 - lateral LLE 65%, posterior RLE - 30%    Time  4    Period  Weeks    Status  On-going      PT LONG TERM GOAL #2   Title  PT lateral  wound size to be no greater than 2x2 to allow pt to feel comfortable in self care of wound ; anterior wound to be healed     Baseline  11/29 - 1.6cm x 1.1 cm    Time  6    Period  Weeks    Status  On-going      PT LONG TERM GOAL #3   Title  If profore felt comfortable to pt pt to be educated in compression garments and why they may be benefical for pt.  Pt has had wounds come up on her Rt leg in the past as well     Baseline  -- 07/09/2017:  Await result of ABI    Time  6    Period  Weeks    Status  Deferred              Patient will benefit from skilled therapeutic intervention in order to improve the following deficits and impairments:     Visit Diagnosis: Pain in left leg  Leg wound, left, subsequent encounter     Problem List Patient Active Problem List   Diagnosis Date Noted  . Iron deficiency anemia 08/07/2017  . Heme positive stool 10/04/2015  . Mixed hyperlipidemia 06/29/2015  . Type 2 diabetes mellitus with stage 4 chronic kidney disease ([place]) 06/29/2015  . Encounter for adequacy testing for hemodialysis ([place]) 04/12/2014  . Chronic kidney disease, stage V ([place]) 04/12/2014  . Focal segmental  glomerulosclerosis without nephrosis or chronic glomerulonephritis 04/06/2014  . Encounter for therapeutic drug monitoring 03/30/2014  . Chronic diastolic heart failure ([place]) 03/15/2014  . Renal failure (ARF), acute on chronic (HCC) 03/13/2014  . Anemia of chronic renal failure, stage 4 (severe) ([place]) 02/23/2014  . Folate deficiency 02/17/2014  . Aortic regurgitation 09/19/2012  . Atrial flutter ([place]) 09/14/2012  . Class 2 severe obesity due to excess calories with serious comorbidity and body mass index (BMI) of 35.0 to 35.9 in adult ([place]) 09/14/2012  . Type 2 diabetes mellitus with diabetic neuropathy ([place]) 09/14/2012  . SLE (systemic lupus erythematosus) ([place]) 09/14/2012  . Essential hypertension, benign 09/14/2012  . COPD (chronic obstructive pulmonary disease) ([place]) 09/14/2012   Teena Irani, PTA/CLT [phone]  Teena Irani 09/21/2017, 11:12 AM  [facility] [address] [place], [place], [address] Phone: [phone]   Fax:  [phone]  Name: TERREA BRUSTER MRN: [id number]  Date of Birth: 1946/07/22

## 2017-09-23 ENCOUNTER — Other Ambulatory Visit: Payer: Self-pay | Admitting: Cardiology

## 2017-09-24 ENCOUNTER — Encounter (HOSPITAL_COMMUNITY): Payer: Medicare Other

## 2017-09-24 ENCOUNTER — Encounter (HOSPITAL_COMMUNITY): Payer: Medicare Other | Attending: Adult Health

## 2017-09-24 DIAGNOSIS — E782 Mixed hyperlipidemia: Secondary | ICD-10-CM | POA: Diagnosis not present

## 2017-09-24 DIAGNOSIS — E1122 Type 2 diabetes mellitus with diabetic chronic kidney disease: Secondary | ICD-10-CM | POA: Insufficient documentation

## 2017-09-24 DIAGNOSIS — D508 Other iron deficiency anemias: Secondary | ICD-10-CM | POA: Diagnosis not present

## 2017-09-24 DIAGNOSIS — M329 Systemic lupus erythematosus, unspecified: Secondary | ICD-10-CM | POA: Diagnosis not present

## 2017-09-24 DIAGNOSIS — I132 Hypertensive heart and chronic kidney disease with heart failure and with stage 5 chronic kidney disease, or end stage renal disease: Secondary | ICD-10-CM | POA: Diagnosis not present

## 2017-09-24 DIAGNOSIS — Z79899 Other long term (current) drug therapy: Secondary | ICD-10-CM | POA: Diagnosis not present

## 2017-09-24 DIAGNOSIS — I4892 Unspecified atrial flutter: Secondary | ICD-10-CM | POA: Insufficient documentation

## 2017-09-24 DIAGNOSIS — D631 Anemia in chronic kidney disease: Secondary | ICD-10-CM

## 2017-09-24 DIAGNOSIS — N184 Chronic kidney disease, stage 4 (severe): Secondary | ICD-10-CM | POA: Diagnosis not present

## 2017-09-24 DIAGNOSIS — J449 Chronic obstructive pulmonary disease, unspecified: Secondary | ICD-10-CM | POA: Diagnosis not present

## 2017-09-24 DIAGNOSIS — Z86718 Personal history of other venous thrombosis and embolism: Secondary | ICD-10-CM | POA: Diagnosis not present

## 2017-09-24 DIAGNOSIS — E538 Deficiency of other specified B group vitamins: Secondary | ICD-10-CM | POA: Diagnosis not present

## 2017-09-24 DIAGNOSIS — Z87891 Personal history of nicotine dependence: Secondary | ICD-10-CM | POA: Diagnosis not present

## 2017-09-24 DIAGNOSIS — I5032 Chronic diastolic (congestive) heart failure: Secondary | ICD-10-CM | POA: Insufficient documentation

## 2017-09-24 DIAGNOSIS — N058 Unspecified nephritic syndrome with other morphologic changes: Secondary | ICD-10-CM | POA: Diagnosis not present

## 2017-09-24 DIAGNOSIS — Z5181 Encounter for therapeutic drug level monitoring: Secondary | ICD-10-CM | POA: Diagnosis not present

## 2017-09-24 LAB — CBC WITH DIFFERENTIAL/PLATELET
Basophils Absolute: 0 10*3/uL (ref 0.0–0.1)
Basophils Relative: 0 %
EOS ABS: 0.1 10*3/uL (ref 0.0–0.7)
EOS PCT: 1 %
HCT: 39.4 % (ref 36.0–46.0)
Hemoglobin: 11.7 g/dL — ABNORMAL LOW (ref 12.0–15.0)
LYMPHS ABS: 0.9 10*3/uL (ref 0.7–4.0)
LYMPHS PCT: 17 %
MCH: 27 pg (ref 26.0–34.0)
MCHC: 29.7 g/dL — AB (ref 30.0–36.0)
MCV: 91 fL (ref 78.0–100.0)
MONO ABS: 0.5 10*3/uL (ref 0.1–1.0)
MONOS PCT: 9 %
Neutro Abs: 4 10*3/uL (ref 1.7–7.7)
Neutrophils Relative %: 73 %
PLATELETS: 242 10*3/uL (ref 150–400)
RBC: 4.33 MIL/uL (ref 3.87–5.11)
RDW: 19.2 % — ABNORMAL HIGH (ref 11.5–15.5)
WBC: 5.5 10*3/uL (ref 4.0–10.5)

## 2017-09-24 NOTE — Patient Instructions (Signed)
[place] at [facility] Discharge Instructions  RECOMMENDATIONS MADE BY THE CONSULTANT AND ANY TEST RESULTS WILL BE SENT TO YOUR REFERRING PHYSICIAN.  Your Hgb was 11.7 today. You do not need your injection. Follow up next week as scheduled.  Thank you for choosing [name] at [facility] to provide your oncology and hematology care.  To afford each patient quality time with our provider, please arrive at least 15 minutes before your scheduled appointment time.    If you have a lab appointment with the [facility] please come in thru the  Main Entrance and check in at the main information desk  You need to re-schedule your appointment should you arrive 10 or more minutes late.  We strive to give you quality time with our providers, and arriving late affects you and other patients whose appointments are after yours.  Also, if you no show three or more times for appointments you may be dismissed from the clinic at the providers discretion.     Again, thank you for choosing [name].  Our hope is that these requests will decrease the amount of time that you wait before being seen by our physicians.       _____________________________________________________________  Should you have questions after your visit to [name], please contact our office at [phone] between the hours of 8:30 a.m. and 4:30 p.m.  Voicemails left after 4:30 p.m. will not be returned until the following business day.  For prescription refill requests, have your pharmacy contact our office.       Resources For Cancer Patients and their Caregivers ? American Cancer Society: Can assist with transportation, wigs, general needs, runs Look Good Feel Better.        ([phone] ? Cancer Care: Provides financial assistance, online support groups, medication/co-pay assistance.  1-800-813-HOPE 213-508-5572) ? [place] Assists [place] cancer patients and their families through emotional , educational and financial support.  316 724 5856 ? [place] DSS Where to apply for food stamps, Medicaid and utility assistance. 269-711-8825 ? [employer]: Transportation to medical appointments. 563 485 9936 ? Social Security Administration: May apply for disability if have a Stage IV cancer. 580-089-9532 478 235 4107 ? [employer], Disability and Transit Services: Assists with nutrition, care and transit needs. [place] Support Programs: @10RELATIVEDAYS @ > Cancer Support Group  2nd Tuesday of the month 1pm-2pm, Journey Room  > Creative Journey  3rd Tuesday of the month 1130am-1pm, Journey Room  > Look Good Feel Better  1st Wednesday of the month 10am-12 noon, Journey Room (Call [place] to register [phone])

## 2017-09-24 NOTE — Progress Notes (Signed)
Patient's hemoglobin is 11.7 today and she does not meet the parameters to receive her Aranesp injection.  Patient will follow up as scheduled for blood work and injection next week.  Patient discharged in stable condition with spouse.

## 2017-09-28 ENCOUNTER — Ambulatory Visit (HOSPITAL_COMMUNITY)
Admission: RE | Admit: 2017-09-28 | Discharge: 2017-09-28 | Disposition: A | Discharge status: Home / Self Care | Payer: Medicare Other | Source: Ambulatory Visit | Attending: Family Medicine | Admitting: Family Medicine

## 2017-09-28 DIAGNOSIS — Z1231 Encounter for screening mammogram for malignant neoplasm of breast: Secondary | ICD-10-CM | POA: Diagnosis not present

## 2017-09-30 ENCOUNTER — Inpatient Hospital Stay (HOSPITAL_COMMUNITY): Payer: Medicare Other

## 2017-09-30 ENCOUNTER — Encounter (HOSPITAL_COMMUNITY): Payer: Self-pay

## 2017-09-30 ENCOUNTER — Inpatient Hospital Stay (HOSPITAL_COMMUNITY): Payer: Medicare Other | Attending: Hematology and Oncology

## 2017-09-30 VITALS — BP 126/60 | HR 72 | Temp 98.0°F | Resp 20 | Wt 187.5 lb

## 2017-09-30 DIAGNOSIS — D631 Anemia in chronic kidney disease: Secondary | ICD-10-CM

## 2017-09-30 DIAGNOSIS — D508 Other iron deficiency anemias: Secondary | ICD-10-CM

## 2017-09-30 DIAGNOSIS — N184 Chronic kidney disease, stage 4 (severe): Secondary | ICD-10-CM

## 2017-09-30 DIAGNOSIS — N058 Unspecified nephritic syndrome with other morphologic changes: Secondary | ICD-10-CM

## 2017-09-30 LAB — CBC WITH DIFFERENTIAL/PLATELET
BASOS PCT: 0 %
Basophils Absolute: 0 10*3/uL (ref 0.0–0.1)
EOS ABS: 0.1 10*3/uL (ref 0.0–0.7)
Eosinophils Relative: 2 %
HEMATOCRIT: 36 % (ref 36.0–46.0)
Hemoglobin: 10.9 g/dL — ABNORMAL LOW (ref 12.0–15.0)
Lymphocytes Relative: 16 %
Lymphs Abs: 0.8 10*3/uL (ref 0.7–4.0)
MCH: 27.2 pg (ref 26.0–34.0)
MCHC: 30.3 g/dL (ref 30.0–36.0)
MCV: 89.8 fL (ref 78.0–100.0)
MONO ABS: 0.4 10*3/uL (ref 0.1–1.0)
MONOS PCT: 8 %
Neutro Abs: 3.8 10*3/uL (ref 1.7–7.7)
Neutrophils Relative %: 74 %
Platelets: 187 10*3/uL (ref 150–400)
RBC: 4.01 MIL/uL (ref 3.87–5.11)
RDW: 18.8 % — ABNORMAL HIGH (ref 11.5–15.5)
WBC: 5 10*3/uL (ref 4.0–10.5)

## 2017-09-30 MED ORDER — DARBEPOETIN ALFA 500 MCG/ML IJ SOSY
500.0000 ug | PREFILLED_SYRINGE | Freq: Once | INTRAMUSCULAR | Status: AC
  Administered 2017-09-30: 500 ug via SUBCUTANEOUS

## 2017-09-30 NOTE — Patient Instructions (Signed)
[facility] at [facility] Discharge Instructions  RECOMMENDATIONS MADE BY THE CONSULTANT AND ANY TEST RESULTS WILL BE SENT TO YOUR REFERRING PHYSICIAN.  Received Aranesp injection today. Follow-up as scheduled. Call clinic for any questions or cocnerns  Thank you for choosing [facility] at [facility] to provide your oncology and hematology care.  To afford each patient quality time with our provider, please arrive at least 15 minutes before your scheduled appointment time.    If you have a lab appointment with the Cancer Center please come in thru the  Main Entrance and check in at the main information desk  You need to re-schedule your appointment should you arrive 10 or more minutes late.  We strive to give you quality time with our providers, and arriving late affects you and other patients whose appointments are after yours.  Also, if you no show three or more times for appointments you may be dismissed from the clinic at the providers discretion.     Again, thank you for choosing [facility].  Our hope is that these requests will decrease the amount of time that you wait before being seen by our physicians.       _____________________________________________________________  Should you have questions after your visit to [facility], please contact our office at [phone] between the hours of 8:30 a.m. and 4:30 p.m.  Voicemails left after 4:30 p.m. will not be returned until the following business day.  For prescription refill requests, have your pharmacy contact our office.       Resources For Cancer Patients and their Caregivers ? American Cancer Society: Can assist with transportation, wigs, general needs, runs Look Good Feel Better.        1-888-227-6333 ? Cancer Care: Provides financial assistance, online support groups, medication/co-pay assistance.  1-800-813-HOPE (4673) ? [facility] Assists [place] cancer patients and their families through emotional , educational and financial support.  336-427-4357 ? [place] DSS Where to apply for food stamps, Medicaid and utility assistance. 336-342-1394 ? [facility]: Transportation to medical appointments. 336-347-2287 ? Social Security Administration: May apply for disability if have a Stage IV cancer. 336-342-7796 1-800-772-1213 ? [place] Aging, Disability and Transit Services: Assists with nutrition, care and transit needs. 336-349-2343  Cancer Center Support Programs: @10RELATIVEDAYS@ > Cancer Support Group  2nd Tuesday of the month 1pm-2pm, Journey Room  > Creative Journey  3rd Tuesday of the month 1130am-1pm, Journey Room  > Look Good Feel Better  1st Wednesday of the month 10am-12 noon, Journey Room (Call American Cancer Society to register 1-800-395-5775)   

## 2017-09-30 NOTE — Progress Notes (Signed)
[name] tolerated Aranesp injection well without complaints or incident. Hgb 10.9 today. VSS Pt discharged via wheelchair in satisfactory condition accompanied by her husband

## 2017-10-01 ENCOUNTER — Encounter (HOSPITAL_COMMUNITY): Payer: Self-pay

## 2017-10-01 ENCOUNTER — Other Ambulatory Visit (HOSPITAL_COMMUNITY): Payer: Self-pay | Admitting: Family Medicine

## 2017-10-01 ENCOUNTER — Ambulatory Visit (HOSPITAL_COMMUNITY): Payer: Medicare Other | Attending: Family Medicine

## 2017-10-01 DIAGNOSIS — S81802D Unspecified open wound, left lower leg, subsequent encounter: Secondary | ICD-10-CM | POA: Diagnosis not present

## 2017-10-01 DIAGNOSIS — R262 Difficulty in walking, not elsewhere classified: Secondary | ICD-10-CM | POA: Diagnosis not present

## 2017-10-01 DIAGNOSIS — R928 Other abnormal and inconclusive findings on diagnostic imaging of breast: Secondary | ICD-10-CM

## 2017-10-01 DIAGNOSIS — S81801D Unspecified open wound, right lower leg, subsequent encounter: Secondary | ICD-10-CM | POA: Insufficient documentation

## 2017-10-01 DIAGNOSIS — M79605 Pain in left leg: Secondary | ICD-10-CM | POA: Insufficient documentation

## 2017-10-01 NOTE — Therapy (Signed)
[place], [place], [address] Phone: [phone]   Fax:  [phone]  Wound Care Therapy  Patient Details  Name: [name] MRN: [id number] Date of Birth: 30-Apr-1946 Referring Provider: Sharilyn Sites   Encounter Date: 10/01/2017  PT End of Session - 10/01/17 1332    Visit Number  29    Number of Visits  35    Date for PT Re-Evaluation  10/02/17    Authorization Type  [employer] medicare  cert 15/1-76/16, gcodes done visit #23;  08/27/17-->10/08/17    Authorization - Visit Number  29    Authorization - Number of Visits  35    PT Start Time  0737    PT Stop Time  1330    PT Time Calculation (min)  25 min    Activity Tolerance  Patient tolerated treatment well    Behavior During Therapy  [facility] for tasks assessed/performed       Past Medical History:  Diagnosis Date  . Anemia   . Arthritis   . Asthma   . Atrial fibrillation ([place])   . Atrial flutter [facility])    TEE/DCCV December 2013 - on Xarelto [facility])  . Bone spur of toe    Right 5th toe  . Chronic diastolic CHF (congestive heart failure) (HCC)    a. EF 50-55% by echo in 2015  . CKD (chronic kidney disease) stage 4, GFR 15-29 ml/min (HCC)    a. s/p fistula placement but not on HD. Followed by Nephrology.   Marland Kitchen COPD (chronic obstructive pulmonary disease) ([place])   . DVT of upper extremity (deep vein thrombosis) ([place])    Right basilic vein, June 1062  . Essential hypertension, benign   . Fractures   . SLE (systemic lupus erythematosus) ([place])   . Type 2 diabetes mellitus ([place])   . UTI (lower urinary tract infection)     Past Surgical History:  Procedure Laterality Date  . ABDOMINAL HYSTERECTOMY  1983  . ANKLE SURGERY  1993  . AV FISTULA PLACEMENT Left 03/27/2014   Procedure: ARTERIOVENOUS (AV) FISTULA CREATION;  Surgeon: Rosetta Posner, MD;  Location: [place];  Service: Vascular;  Laterality: Left;  . BACK SURGERY  1980  . CARDIOVASCULAR STRESS TEST  12/19/2009    no stress induced rhythm abnormalities, ekg negative for ischemia  . CARDIOVERSION  09/16/2012   Procedure: CARDIOVERSION;  Surgeon: Sanda Klein, MD;  Location: [facility] ENDOSCOPY;  Service: Cardiovascular;  Laterality: N/A;  . COLONOSCOPY  09/20/2012   Dr. [name]: multiple tubular adenomas   . COLONOSCOPY  2005   Dr. [name]. Polyps, path unknown   . COLONOSCOPY N/A 07/24/2016   Procedure: COLONOSCOPY;  Surgeon: [name], MD;  [facility];  Service: Endoscopy;  Laterality: N/A;  9:00 am  . ESOPHAGOGASTRODUODENOSCOPY  09/20/2012   Dr. [name]: duodenal erosion and possible resolving ulcer at the angularis   . TEE WITHOUT CARDIOVERSION  09/16/2012   Procedure: TRANSESOPHAGEAL ECHOCARDIOGRAM (TEE);  Surgeon: Sanda Klein, MD;  Location: [facility] ENDOSCOPY;  Service: Cardiovascular;  Laterality: N/A;  . TRANSESOPHAGEAL ECHOCARDIOGRAM WITH CARDIOVERSION  09/16/2012   EF 60-65%, moderate LVH, moderate regurg of the aortic valve, LA moderately dilated    There were no vitals filed for this visit.              Wound Therapy - 10/01/17 1333    Subjective  Pt continues to c/o intermittent Rt hip pain, reports it feels okay today.  Arrived with dressings intact    Patient and Family Stated Goals  wounds to heal     Prior Treatments  self care and antibiotid    Pain Assessment  No/denies pain    Pain Score  8     Evaluation and Treatment Procedures Explained to Patient/Family  Yes    Evaluation and Treatment Procedures  agreed to    Wound Properties Date First Assessed: 06/25/17 Time First Assessed: 1100 Wound Type: Non-pressure wound Location: Leg Location Orientation: Posterior;Medial;Right Wound Description (Comments): blister   Dressing Type  Silver hydrofiber;Gauze (Comment) silverhydrofiber, 2x2, and medipore tape    Dressing Changed  Changed    Dressing Status  Old drainage    Dressing Change Frequency  PRN    Site / Wound Assessment  Pink;Yellow    % Wound base  Red or Granulating  85%    % Wound base Yellow/Fibrinous Exudate  15%    % Wound base Black/Eschar  0%    Peri-wound Assessment  Intact    Wound Length (cm)  1 cm was 1 cm    Wound Width (cm)  1 cm was 1 cm    Wound Depth (cm)  0.3 cm was .3    Wound Volume (cm^3)  0.3 cm^3    Wound Surface Area (cm^2)  1 cm^2    Margins  Epibole (rolled edges)    Drainage Amount  Scant    Drainage Description  Serous;No odor    Treatment  Cleansed;Debridement (Selective)    Wound Properties Date First Assessed: 06/04/17 Time First Assessed: 5361 Wound Type: Non-pressure wound Location: Leg Location Orientation: Left;Lateral;Lower Present on Admission: Yes   Dressing Type  Gauze (Comment);Impregnated gauze (bismuth) xerform, 2x2, medipore tape    Dressing Changed  Changed    Dressing Status  Dry    Dressing Change Frequency  PRN    Site / Wound Assessment  Dry    % Wound base Red or Granulating  95%    % Wound base Yellow/Fibrinous Exudate  5%    Wound Length (cm)  0.8 cm    Wound Width (cm)  0.3 cm    Wound Depth (cm)  0.2 cm    Wound Volume (cm^3)  0.05 cm^3    Wound Surface Area (cm^2)  0.24 cm^2    Margins  Attached edges (approximated)    Drainage Amount  Scant    Drainage Description  Serous    Treatment  Cleansed;Debridement (Selective)    Selective Debridement - Location  both wounds     Selective Debridement - Tools Used  Forceps    Selective Debridement - Tissue Removed  slough    Wound Therapy - Clinical Statement  Wound continues to improve in granulation tissues and approximation especially Lt LE.  Continued selective debridement for removal of slough and dry skin perimeter to promote healing.  No reports of pain through session.      Wound Therapy - Functional Problem List  difficulty in walking     Factors Delaying/Impairing Wound Healing  Altered sensation;Multiple medical problems;Vascular compromise    Hydrotherapy Plan  Debridement;Dressing change;Patient/family education     Wound Therapy - Frequency  -- 1/week    Wound Therapy - Current Recommendations  PT    Wound Plan  Reassess next session.  Continue woundcare 1X week with measurements.    Dressing   Rt silverhydrofiber Lt xeroform wiht gauze and medipore tape  PT Short Term Goals - 08/27/17 1325      PT SHORT TERM GOAL #1   Title  Pt to understand signs and symptoms of cellulits and the importance of contacting MD if this occurs     Time  1    Period  Weeks    Status  Achieved      PT SHORT TERM GOAL #2   Title  PT lateral wound to be 75% granulated and to have no black eschar to reduce the risk of infection.    Baseline  11/29 - 65% currently no black eschar; 60% granulated     Time  2    Period  Weeks    Status  On-going      PT SHORT TERM GOAL #3   Title  Pt pain to be decreased to no greater than a 4/10 to allow pt to rest better    Baseline  -- 07/09/2017:  pain is no greater than a 4/10 except when debriding.  Pt unable to walk 10 minutes due to CHF revised goal to be so that pt can rest better.     Time  3    Period  Weeks    Status  Revised        PT Long Term Goals - 08/27/17 1326      PT LONG TERM GOAL #1   Title  Pt wound to be 100% granulated to prevent infection     Baseline  11/29 - lateral LLE 65%, posterior RLE - 30%    Time  4    Period  Weeks    Status  On-going      PT LONG TERM GOAL #2   Title  PT lateral  wound size to be no greater than 2x2 to allow pt to feel comfortable in self care of wound ; anterior wound to be healed     Baseline  11/29 - 1.6cm x 1.1 cm    Time  6    Period  Weeks    Status  On-going      PT LONG TERM GOAL #3   Title  If profore felt comfortable to pt pt to be educated in compression garments and why they may be benefical for pt.  Pt has had wounds come up on her Rt leg in the past as well     Baseline  -- 07/09/2017:  Await result of ABI    Time  6    Period  Weeks    Status  Deferred               Patient will benefit from skilled therapeutic intervention in order to improve the following deficits and impairments:     Visit Diagnosis: Pain in left leg  Leg wound, left, subsequent encounter  Leg wound, right, subsequent encounter  Difficulty in walking, not elsewhere classified     Problem List Patient Active Problem List   Diagnosis Date Noted  . Iron deficiency anemia 08/07/2017  . Heme positive stool 10/04/2015  . Mixed hyperlipidemia 06/29/2015  . Type 2 diabetes mellitus with stage 4 chronic kidney disease ([place]) 06/29/2015  . Encounter for adequacy testing for hemodialysis ([place]) 04/12/2014  . Chronic kidney disease, stage V ([place]) 04/12/2014  . 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 Encounter for therapeutic drug monitoring 03/30/2014  . Chronic diastolic heart failure ([place]) 03/15/2014  . Renal failure (ARF), acute on chronic (HCC) 03/13/2014  . Anemia of  chronic renal failure, stage 4 (severe) ([place]) 02/23/2014  . Folate deficiency 02/17/2014  . Aortic regurgitation 09/19/2012  . Atrial flutter ([place]) 09/14/2012  . Class 2 severe obesity due to excess calories with serious comorbidity and body mass index (BMI) of 35.0 to 35.9 in adult ([place]) 09/14/2012  . Type 2 diabetes mellitus with diabetic neuropathy ([place]) 09/14/2012  . SLE (systemic lupus erythematosus) ([place]) 09/14/2012  . Essential hypertension, benign 09/14/2012  . COPD (chronic obstructive pulmonary disease) ([place]) 09/14/2012   Ihor Austin, LPTA; [place]  Aldona Lento 10/01/2017, 1:43 PM  [place] [address] [place], [place], [address] Phone: [phone]   Fax:  [phone]  Name: [name] MRN: [id number] Date of Birth: 1946-01-24

## 2017-10-06 ENCOUNTER — Encounter (HOSPITAL_COMMUNITY): Payer: Self-pay | Admitting: Physical Therapy

## 2017-10-06 ENCOUNTER — Ambulatory Visit (HOSPITAL_COMMUNITY): Payer: Medicare Other | Admitting: Physical Therapy

## 2017-10-06 DIAGNOSIS — S81802D Unspecified open wound, left lower leg, subsequent encounter: Secondary | ICD-10-CM

## 2017-10-06 DIAGNOSIS — R262 Difficulty in walking, not elsewhere classified: Secondary | ICD-10-CM | POA: Diagnosis not present

## 2017-10-06 DIAGNOSIS — S81801D Unspecified open wound, right lower leg, subsequent encounter: Secondary | ICD-10-CM

## 2017-10-06 DIAGNOSIS — M79605 Pain in left leg: Secondary | ICD-10-CM | POA: Diagnosis not present

## 2017-10-06 NOTE — Therapy (Signed)
[place], [place], [address] Phone: [phone]   Fax:  [phone]  Wound Care Therapy  Patient Details  Name: [name] MRN: [id number] Date of Birth: December 27, 1945 Referring Provider: Sharilyn Sites   Encounter Date: 10/06/2017  PT End of Session - 10/06/17 1156    Visit Number  30    Number of Visits  36    Date for PT Re-Evaluation  11/20/17    Authorization Type  [employer] medicare  cert 67/3-41/93, gcodes done visit #23;  08/27/17-->10/08/17    Authorization - Visit Number  30    Authorization - Number of Visits  36    PT Start Time  1115    PT Stop Time  1140    PT Time Calculation (min)  25 min    Activity Tolerance  Patient tolerated treatment well    Behavior During Therapy  [facility] for tasks assessed/performed       Past Medical History:  Diagnosis Date  . Anemia   . Arthritis   . Asthma   . Atrial fibrillation ([place])   . Atrial flutter [facility])    TEE/DCCV December 2013 - on Xarelto [facility])  . Bone spur of toe    Right 5th toe  . Chronic diastolic CHF (congestive heart failure) (HCC)    a. EF 50-55% by echo in 2015  . CKD (chronic kidney disease) stage 4, GFR 15-29 ml/min (HCC)    a. s/p fistula placement but not on HD. Followed by Nephrology.   Marland Kitchen COPD (chronic obstructive pulmonary disease) ([place])   . DVT of upper extremity (deep vein thrombosis) ([place])    Right basilic vein, June 7902  . Essential hypertension, benign   . Fractures   . SLE (systemic lupus erythematosus) ([place])   . Type 2 diabetes mellitus ([place])   . UTI (lower urinary tract infection)     Past Surgical History:  Procedure Laterality Date  . ABDOMINAL HYSTERECTOMY  1983  . ANKLE SURGERY  1993  . AV FISTULA PLACEMENT Left 03/27/2014   Procedure: ARTERIOVENOUS (AV) FISTULA CREATION;  Surgeon: Rosetta Posner, MD;  Location: [place];  Service: Vascular;  Laterality: Left;  . BACK SURGERY  1980  . CARDIOVASCULAR STRESS TEST  12/19/2009    no stress induced rhythm abnormalities, ekg negative for ischemia  . CARDIOVERSION  09/16/2012   Procedure: CARDIOVERSION;  Surgeon: Sanda Klein, MD;  Location: [facility] ENDOSCOPY;  Service: Cardiovascular;  Laterality: N/A;  . COLONOSCOPY  09/20/2012   Dr. [name]: multiple tubular adenomas   . COLONOSCOPY  2005   Dr. [name]. Polyps, path unknown   . COLONOSCOPY N/A 07/24/2016   Procedure: COLONOSCOPY;  Surgeon: [name], MD;  [facility];  Service: Endoscopy;  Laterality: N/A;  9:00 am  . ESOPHAGOGASTRODUODENOSCOPY  09/20/2012   Dr. [name]: duodenal erosion and possible resolving ulcer at the angularis   . TEE WITHOUT CARDIOVERSION  09/16/2012   Procedure: TRANSESOPHAGEAL ECHOCARDIOGRAM (TEE);  Surgeon: Sanda Klein, MD;  Location: [facility] ENDOSCOPY;  Service: Cardiovascular;  Laterality: N/A;  . TRANSESOPHAGEAL ECHOCARDIOGRAM WITH CARDIOVERSION  09/16/2012   EF 60-65%, moderate LVH, moderate regurg of the aortic valve, LA moderately dilated    There were no vitals filed for this visit.              Wound Therapy - 10/06/17 1142    Subjective  Pt states that she no longer has any pain in her legs but  her right hip continues to bother her.  (Pt is not being seen in this clinic for her hip)     Patient and Family Stated Goals  wounds to heal     Prior Treatments  self care and antibiotid    Pain Assessment  0-10    Pain Score  6     Pain Location  Hip    Pain Orientation  Right    Pain Descriptors / Indicators  Aching;Constant    Pain Onset  On-going    Pain Intervention(s)  Emotional support    Evaluation and Treatment Procedures Explained to Patient/Family  Yes    Evaluation and Treatment Procedures  agreed to    Wound Properties Date First Assessed: 06/25/17 Time First Assessed: 1100 Wound Type: Non-pressure wound Location: Leg Location Orientation: Posterior;Medial;Right Wound Description (Comments): blister   Dressing Type  Silver hydrofiber;Gauze  (Comment) silverhydrofiber, 2x2, and medipore tape    Dressing Changed  Changed    Dressing Status  Old drainage    Dressing Change Frequency  PRN    Site / Wound Assessment  Red;Yellow    % Wound base Red or Granulating  95% was 30 % last recertification.    % Wound base Yellow/Fibrinous Exudate  5% was 04% last recertification    % Wound base Black/Eschar  0%    Peri-wound Assessment  Intact    Wound Length (cm)  1.2 cm was 1.3 last recert    Wound Width (cm)  1 cm was 1 last recert    Wound Depth (cm)  0.2 cm was .3 last recert.     Wound Volume (cm^3)  0.24 cm^3    Wound Surface Area (cm^2)  1.2 cm^2    Margins  Epibole (rolled edges)    Drainage Amount  Scant    Drainage Description  Serous;No odor    Treatment  Cleansed;Debridement (Selective)    Wound Properties Date First Assessed: 06/04/17 Time First Assessed: 5409 Wound Type: Non-pressure wound Location: Leg Location Orientation: Left;Lateral;Lower Present on Admission: Yes   Dressing Type  Gauze (Comment);Impregnated gauze (bismuth) xerform, 2x2, medipore tape    Dressing Changed  Changed    Dressing Status  Dry    Dressing Change Frequency  PRN    Site / Wound Assessment  Dry    % Wound base Red or Granulating  -- 98 % was 81% last recert     % Wound base Yellow/Fibrinous Exudate  -- 2% was 19% last recert     Wound Length (cm)  0.3 cm was 1.0 last recert     Wound Width (cm)  0.2 cm was 1.6 last recert     Wound Depth (cm)  0.1 cm was .1 last recert.     Wound Volume (cm^3)  0.01 cm^3    Wound Surface Area (cm^2)  0.06 cm^2    Margins  Attached edges (approximated)    Drainage Amount  Scant    Drainage Description  Serous    Treatment  Cleansed;Debridement (Selective)    Selective Debridement - Location  both wounds     Selective Debridement - Tools Used  Forceps    Selective Debridement - Tissue Removed  slough    Wound Therapy - Clinical Statement  Wounds continue to slowly progress.  Healing is limited to marked  blockage of arteries but MD has stated that due to other medical issues he does not want to place stents in pt LE>  Pt will continue to benefit  from skilled physical thearpy once a week for wound care to ensure proper healing environment as well as progression of wound healing.     Wound Therapy - Functional Problem List  difficulty in walking     Factors Delaying/Impairing Wound Healing  Altered sensation;Multiple medical problems;Vascular compromise    Hydrotherapy Plan  Debridement;Dressing change;Patient/family education    Wound Therapy - Frequency  -- 1/week    Wound Therapy - Current Recommendations  PT    Wound Plan  Continue with wound care.  Lt LE wound will most likely be able to be totally discontinued.     Dressing   Rt silverhydrofiber Lt xeroform wiht gauze and medipore tape                PT Short Term Goals - 10/06/17 1157      PT SHORT TERM GOAL #1   Title  Pt to understand signs and symptoms of cellulits and the importance of contacting MD if this occurs     Time  1    Period  Weeks    Status  Achieved      PT SHORT TERM GOAL #2   Title  PT lateral wound to be 75% granulated and to have no black eschar to reduce the risk of infection.    Baseline  -- currently no black eschar; 60% granulated     Time  2    Period  Weeks    Status  Achieved      PT SHORT TERM GOAL #3   Title  Pt pain to be decreased to no greater than a 4/10 to allow pt to rest better    Baseline  Pain in LE 0/10 now but has RT hip pain which we are not seeing pt for  07/09/2017:  pain is no greater than a 4/10 except when debriding.  Pt unable to walk 10 minutes due to CHF revised goal to be so that pt can rest better.     Time  3    Period  Weeks    Status  Achieved        PT Long Term Goals - 10/06/17 1158      PT LONG TERM GOAL #1   Title  Pt wound to be 100% granulated to prevent infection     Baseline  11/29 - lateral LLE 65%, posterior RLE - 30%    Time  4    Period  Weeks     Status  On-going      PT LONG TERM GOAL #2   Title  PT lateral  wound size to be no greater than 2x2 to allow pt to feel comfortable in self care of wound ; anterior wound to be healed     Baseline  11/29 - 1.6cm x 1.1 cm    Time  6    Period  Weeks    Status  Achieved      PT LONG TERM GOAL #3   Title  If profore felt comfortable to pt pt to be educated in compression garments and why they may be benefical for pt.  Pt has had wounds come up on her Rt leg in the past as well     Baseline  -- 07/09/2017:  Await result of ABI    Time  6    Period  Weeks    Status  Deferred            Plan - 10/06/17 1157  Clinical Impression Statement  as above     Rehab Potential  Good    PT Frequency  2x / week    PT Duration  6 weeks    PT Treatment/Interventions  Other (comment) debridement and dressing change     PT Next Visit Plan  continue to remeasure wounds weekly; decrease pt to one time a week     Consulted and Agree with Plan of Care  Patient       Patient will benefit from skilled therapeutic intervention in order to improve the following deficits and impairments:  Decreased activity tolerance, Increased edema, Difficulty walking, Pain  Visit Diagnosis: Pain in left leg  Leg wound, left, subsequent encounter  Leg wound, right, subsequent encounter  Difficulty in walking, not elsewhere classified     Problem List Patient Active Problem List   Diagnosis Date Noted  . Iron deficiency anemia 08/07/2017  . Heme positive stool 10/04/2015  . Mixed hyperlipidemia 06/29/2015  . Type 2 diabetes mellitus with stage 4 chronic kidney disease ([place]) 06/29/2015  . Encounter for adequacy testing for hemodialysis ([place]) 04/12/2014  . Chronic kidney disease, stage V ([place]) 04/12/2014  . 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 Encounter for therapeutic drug monitoring 03/30/2014  . Chronic diastolic heart failure ([place])  03/15/2014  . Renal failure (ARF), acute on chronic (HCC) 03/13/2014  . Anemia of chronic renal failure, stage 4 (severe) ([place] [address]) 02/23/2014  . Folate deficiency 02/17/2014  . Aortic regurgitation 09/19/2012  . Atrial flutter ([place]) 09/14/2012  . Class 2 severe obesity due to excess calories with serious comorbidity and body mass index (BMI) of 35.0 to 35.9 in adult ([place]) 09/14/2012  . Type 2 diabetes mellitus with diabetic neuropathy ([place]) 09/14/2012  . SLE (systemic lupus erythematosus) ([place]) 09/14/2012  . Essential hypertension, benign 09/14/2012  . COPD (chronic obstructive pulmonary disease) ([place]) 09/14/2012    Rayetta Humphrey, PT [phone] 10/06/2017, 11:59 AM  [place], [place], [address] Phone: [phone]   Fax:  [phone]  Name: [name] MRN: [id number] Date of Birth: May 15, 1946

## 2017-10-07 ENCOUNTER — Inpatient Hospital Stay (HOSPITAL_COMMUNITY): Payer: Medicare Other

## 2017-10-07 DIAGNOSIS — D631 Anemia in chronic kidney disease: Secondary | ICD-10-CM

## 2017-10-07 DIAGNOSIS — N184 Chronic kidney disease, stage 4 (severe): Secondary | ICD-10-CM | POA: Diagnosis not present

## 2017-10-07 LAB — CBC WITH DIFFERENTIAL/PLATELET
BASOS ABS: 0 10*3/uL (ref 0.0–0.1)
BASOS PCT: 0 %
Eosinophils Absolute: 0.1 10*3/uL (ref 0.0–0.7)
Eosinophils Relative: 2 %
HEMATOCRIT: 37.5 % (ref 36.0–46.0)
HEMOGLOBIN: 11.1 g/dL — AB (ref 12.0–15.0)
Lymphocytes Relative: 14 %
Lymphs Abs: 1 10*3/uL (ref 0.7–4.0)
MCH: 26.7 pg (ref 26.0–34.0)
MCHC: 29.6 g/dL — ABNORMAL LOW (ref 30.0–36.0)
MCV: 90.4 fL (ref 78.0–100.0)
Monocytes Absolute: 0.7 10*3/uL (ref 0.1–1.0)
Monocytes Relative: 10 %
NEUTROS ABS: 5.1 10*3/uL (ref 1.7–7.7)
Neutrophils Relative %: 74 %
Platelets: 201 10*3/uL (ref 150–400)
RBC: 4.15 MIL/uL (ref 3.87–5.11)
RDW: 19.2 % — AB (ref 11.5–15.5)
WBC: 6.8 10*3/uL (ref 4.0–10.5)

## 2017-10-07 NOTE — Progress Notes (Signed)
Hgb 11.1 today. Aranesp held per parameters. Informed pt who verbalized understanding

## 2017-10-09 ENCOUNTER — Other Ambulatory Visit (HOSPITAL_COMMUNITY): Payer: Self-pay | Admitting: Adult Health

## 2017-10-12 DIAGNOSIS — E1151 Type 2 diabetes mellitus with diabetic peripheral angiopathy without gangrene: Secondary | ICD-10-CM | POA: Diagnosis not present

## 2017-10-12 DIAGNOSIS — E1140 Type 2 diabetes mellitus with diabetic neuropathy, unspecified: Secondary | ICD-10-CM | POA: Diagnosis not present

## 2017-10-12 DIAGNOSIS — L89892 Pressure ulcer of other site, stage 2: Secondary | ICD-10-CM | POA: Diagnosis not present

## 2017-10-13 ENCOUNTER — Encounter (HOSPITAL_COMMUNITY): Payer: Self-pay | Admitting: Physical Therapy

## 2017-10-13 ENCOUNTER — Ambulatory Visit (HOSPITAL_COMMUNITY)
Admission: RE | Admit: 2017-10-13 | Discharge: 2017-10-13 | Disposition: A | Discharge status: Home / Self Care | Payer: Medicare Other | Source: Ambulatory Visit | Attending: Family Medicine | Admitting: Family Medicine

## 2017-10-13 ENCOUNTER — Ambulatory Visit (HOSPITAL_COMMUNITY): Payer: Medicare Other | Admitting: Physical Therapy

## 2017-10-13 DIAGNOSIS — M79605 Pain in left leg: Secondary | ICD-10-CM | POA: Diagnosis not present

## 2017-10-13 DIAGNOSIS — R928 Other abnormal and inconclusive findings on diagnostic imaging of breast: Secondary | ICD-10-CM | POA: Diagnosis not present

## 2017-10-13 DIAGNOSIS — S81802D Unspecified open wound, left lower leg, subsequent encounter: Secondary | ICD-10-CM | POA: Diagnosis not present

## 2017-10-13 DIAGNOSIS — S81801D Unspecified open wound, right lower leg, subsequent encounter: Secondary | ICD-10-CM | POA: Diagnosis not present

## 2017-10-13 DIAGNOSIS — R262 Difficulty in walking, not elsewhere classified: Secondary | ICD-10-CM

## 2017-10-13 DIAGNOSIS — L723 Sebaceous cyst: Secondary | ICD-10-CM | POA: Diagnosis not present

## 2017-10-13 DIAGNOSIS — N6310 Unspecified lump in the right breast, unspecified quadrant: Secondary | ICD-10-CM | POA: Diagnosis not present

## 2017-10-13 NOTE — Therapy (Signed)
[place], [place], [address] Phone: [phone]   Fax:  [phone]  Wound Care Therapy  Patient Details  Name: [name] MRN: [id number] Date of Birth: 1945/11/22 Referring Provider: Sharilyn Sites    Encounter Date: 10/13/2017  PT End of Session - 10/13/17 1427    Visit Number  31    Number of Visits  36    Date for PT Re-Evaluation  11/20/17    Authorization Type  [employer] medicare  cert 27/2-53/66, gcodes done visit #23;  08/27/17-->10/08/17    Authorization - Visit Number  31    Authorization - Number of Visits  36    PT Start Time  4403    PT Stop Time  1410    PT Time Calculation (min)  22 min    Activity Tolerance  Patient tolerated treatment well    Behavior During Therapy  [facility] for tasks assessed/performed       Past Medical History:  Diagnosis Date  . Anemia   . Arthritis   . Asthma   . Atrial fibrillation ([place])   . Atrial flutter [facility])    TEE/DCCV December 2013 - on Xarelto [facility])  . Bone spur of toe    Right 5th toe  . Chronic diastolic CHF (congestive heart failure) (HCC)    a. EF 50-55% by echo in 2015  . CKD (chronic kidney disease) stage 4, GFR 15-29 ml/min (HCC)    a. s/p fistula placement but not on HD. Followed by Nephrology.   Marland Kitchen COPD (chronic obstructive pulmonary disease) ([place])   . DVT of upper extremity (deep vein thrombosis) ([place])    Right basilic vein, June 4742  . Essential hypertension, benign   . Fractures   . SLE (systemic lupus erythematosus) ([place])   . Type 2 diabetes mellitus ([place])   . UTI (lower urinary tract infection)     Past Surgical History:  Procedure Laterality Date  . ABDOMINAL HYSTERECTOMY  1983  . ANKLE SURGERY  1993  . AV FISTULA PLACEMENT Left 03/27/2014   Procedure: ARTERIOVENOUS (AV) FISTULA CREATION;  Surgeon: Rosetta Posner, MD;  Location: [place];  Service: Vascular;  Laterality: Left;  . BACK SURGERY  1980  . CARDIOVASCULAR STRESS TEST  12/19/2009    no stress induced rhythm abnormalities, ekg negative for ischemia  . CARDIOVERSION  09/16/2012   Procedure: CARDIOVERSION;  Surgeon: Sanda Klein, MD;  Location[facility] ENDOSCOPY;  Service: Cardiovascular;  Laterality: N/A;  . COLONOSCOPY  09/20/2012   Dr. [name]: multiple tubular adenomas   . COLONOSCOPY  2005   Dr. [name]. Polyps, path unknown   . COLONOSCOPY N/A 07/24/2016   Procedure: COLONOSCOPY;  Surgeon: [name], MD;  [facility];  Service: Endoscopy;  Laterality: N/A;  9:00 am  . ESOPHAGOGASTRODUODENOSCOPY  09/20/2012   Dr. [name]: duodenal erosion and possible resolving ulcer at the angularis   . TEE WITHOUT CARDIOVERSION  09/16/2012   Procedure: TRANSESOPHAGEAL ECHOCARDIOGRAM (TEE);  Surgeon: Sanda Klein, MD;  Location: [facility] ENDOSCOPY;  Service: Cardiovascular;  Laterality: N/A;  . TRANSESOPHAGEAL ECHOCARDIOGRAM WITH CARDIOVERSION  09/16/2012   EF 60-65%, moderate LVH, moderate regurg of the aortic valve, LA moderately dilated    There were no vitals filed for this visit.              Wound Therapy - 10/13/17 1431    Subjective  Pt states she no longer having pain in her legs but her  right hip continues to bother her.  (Pt is not being seen in this clinic for her hip)     Patient and Family Stated Goals  wounds to heal     Prior Treatments  self care and antibiotid    Evaluation and Treatment Procedures Explained to Patient/Family  Yes    Evaluation and Treatment Procedures  agreed to    Wound Properties Date First Assessed: 06/25/17 Time First Assessed: 1100 Wound Type: Non-pressure wound Location: Leg Location Orientation: Posterior;Medial;Right Wound Description (Comments): blister   Dressing Type  Silver hydrofiber;Gauze (Comment) silverhydrofiber, 2x2, and medipore tape    Dressing Status  Old drainage    Dressing Change Frequency  PRN    Site / Wound Assessment  Red;Yellow    % Wound base Red or Granulating  95%    % Wound base  Yellow/Fibrinous Exudate  5%    % Wound base Black/Eschar  0%    Peri-wound Assessment  Intact    Margins  Epibole (rolled edges)    Drainage Amount  Scant    Drainage Description  Serous;No odor    Wound Properties Date First Assessed: 06/04/17 Time First Assessed: 1950 Wound Type: Non-pressure wound Location: Leg Location Orientation: Left;Lateral;Lower Present on Admission: Yes Final Assessment Date: 10/13/17 Final Assessment Time: 1422   Selective Debridement - Location  Rt leg wound    Selective Debridement - Tools Used  Forceps    Selective Debridement - Tissue Removed  slough    Wound Therapy - Clinical Statement  Pt would remains unchanged in both granulation and size. This would be expected as patient has severe arterial blockage in this leg which is unoperable. Patient will be decreased to every other week at this time. Pt will continue to benefit from skilled PT to ensure that is a positive wound healing environment.     Wound Therapy - Functional Problem List  difficulty in walking     Factors Delaying/Impairing Wound Healing  Altered sensation;Multiple medical problems;Vascular compromise    Hydrotherapy Plan  Debridement;Dressing change;Patient/family education    Wound Therapy - Frequency  -- 1/week    Wound Therapy - Current Recommendations  PT    Wound Plan  Measure wound every visit.     Dressing                    PT Short Term Goals - 10/06/17 1157      PT SHORT TERM GOAL #1   Title  Pt to understand signs and symptoms of cellulits and the importance of contacting MD if this occurs     Time  1    Period  Weeks    Status  Achieved      PT SHORT TERM GOAL #2   Title  PT lateral wound to be 75% granulated and to have no black eschar to reduce the risk of infection.    Baseline  -- currently no black eschar; 60% granulated     Time  2    Period  Weeks    Status  Achieved      PT SHORT TERM GOAL #3   Title  Pt pain to be decreased to no greater than a 4/10  to allow pt to rest better    Baseline  Pain in LE 0/10 now but has RT hip pain which we are not seeing pt for  07/09/2017:  pain is no greater than a 4/10 except when debriding.  Pt unable to walk 10  minutes due to CHF revised goal to be so that pt can rest better.     Time  3    Period  Weeks    Status  Achieved        PT Long Term Goals - 10/06/17 1158      PT LONG TERM GOAL #1   Title  Pt wound to be 100% granulated to prevent infection     Baseline  11/29 - lateral LLE 65%, posterior RLE - 30%    Time  4    Period  Weeks    Status  On-going      PT LONG TERM GOAL #2   Title  PT lateral  wound size to be no greater than 2x2 to allow pt to feel comfortable in self care of wound ; anterior wound to be healed     Baseline  11/29 - 1.6cm x 1.1 cm    Time  6    Period  Weeks    Status  Achieved      PT LONG TERM GOAL #3   Title  If profore felt comfortable to pt pt to be educated in compression garments and why they may be benefical for pt.  Pt has had wounds come up on her Rt leg in the past as well     Baseline  -- 07/09/2017:  Await result of ABI    Time  6    Period  Weeks    Status  Deferred            Plan - 10/13/17 1428    Clinical Impression Statement  as above    Clinical Presentation  Stable    Rehab Potential  Good    PT Frequency  Biweekly    PT Duration  6 weeks    PT Treatment/Interventions  Other (comment) debridement and dressing change     PT Next Visit Plan  continue to remeasure wounds each week; decrease pt to one time every other week    Consulted and Agree with Plan of Care  Patient       Patient will benefit from skilled therapeutic intervention in order to improve the following deficits and impairments:  Decreased activity tolerance, Increased edema, Difficulty walking, Pain  Visit Diagnosis: Leg wound, right, subsequent encounter  Leg wound, left, subsequent encounter  Difficulty in walking, not elsewhere classified     Problem  List Patient Active Problem List   Diagnosis Date Noted  . Iron deficiency anemia 08/07/2017  . Heme positive stool 10/04/2015  . Mixed hyperlipidemia 06/29/2015  . Type 2 diabetes mellitus with stage 4 chronic kidney disease ([place]) 06/29/2015  . Encounter for adequacy testing for hemodialysis ([place]) 04/12/2014  . Chronic kidney disease, stage V ([place]) 04/12/2014  . 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 Encounter for therapeutic drug monitoring 03/30/2014  . Chronic diastolic heart failure ([place]) 03/15/2014  . Renal failure (ARF), acute on chronic (HCC) 03/13/2014  . Anemia of chronic renal failure, stage 4 (severe) ([place]) 02/23/2014  . Folate deficiency 02/17/2014  . Aortic regurgitation 09/19/2012  . Atrial flutter ([place]) 09/14/2012  . Class 2 severe obesity due to excess calories with serious comorbidity and body mass index (BMI) of 35.0 to 35.9 in adult ([place]) 09/14/2012  . Type 2 diabetes mellitus with diabetic neuropathy ([place]) 09/14/2012  . SLE (systemic lupus erythematosus) ([place]) 09/14/2012  . Essential hypertension, benign 09/14/2012  . COPD (chronic obstructive pulmonary disease) ([place]) 09/14/2012  Rayetta Humphrey, PT CLT [phone] 10/13/2017, 2:32 PM  [place] [address] [place], [place], [address] Phone: [phone]   Fax:  [phone]  Name: [name] MRN: [id number] Date of Birth: 22-Sep-1946

## 2017-10-14 ENCOUNTER — Ambulatory Visit (INDEPENDENT_AMBULATORY_CARE_PROVIDER_SITE_OTHER): Payer: Medicare Other | Admitting: *Deleted

## 2017-10-14 ENCOUNTER — Other Ambulatory Visit (HOSPITAL_COMMUNITY): Payer: Medicare Other

## 2017-10-14 ENCOUNTER — Ambulatory Visit (HOSPITAL_COMMUNITY): Payer: Medicare Other

## 2017-10-14 DIAGNOSIS — I4892 Unspecified atrial flutter: Secondary | ICD-10-CM | POA: Diagnosis not present

## 2017-10-14 DIAGNOSIS — Z5181 Encounter for therapeutic drug level monitoring: Secondary | ICD-10-CM

## 2017-10-14 LAB — POCT INR: INR: 1.7

## 2017-10-14 NOTE — Patient Instructions (Signed)
Take coumadin 1 tablet tonight the resume 1/2 tablet daily except 1 tablet on Sundays, Tuesdays and Thursdays   Recheck in 3 weeks

## 2017-10-15 ENCOUNTER — Encounter: Payer: Self-pay | Admitting: Orthopaedic Surgery

## 2017-10-15 ENCOUNTER — Ambulatory Visit: Payer: Medicare Other | Admitting: Orthopaedic Surgery

## 2017-10-15 ENCOUNTER — Ambulatory Visit: Payer: Medicare Other | Admitting: Vascular Surgery

## 2017-10-15 ENCOUNTER — Other Ambulatory Visit: Payer: Self-pay | Admitting: Vascular Surgery

## 2017-10-15 ENCOUNTER — Other Ambulatory Visit: Payer: Self-pay | Admitting: "Endocrinology

## 2017-10-15 ENCOUNTER — Encounter: Payer: Self-pay | Admitting: Vascular Surgery

## 2017-10-15 VITALS — BP 149/60 | HR 70 | Temp 97.6°F | Resp 18 | Ht 64.0 in | Wt 190.5 lb

## 2017-10-15 DIAGNOSIS — E1140 Type 2 diabetes mellitus with diabetic neuropathy, unspecified: Secondary | ICD-10-CM

## 2017-10-15 DIAGNOSIS — M7061 Trochanteric bursitis, right hip: Secondary | ICD-10-CM

## 2017-10-15 DIAGNOSIS — I739 Peripheral vascular disease, unspecified: Secondary | ICD-10-CM | POA: Diagnosis not present

## 2017-10-15 DIAGNOSIS — Z794 Long term (current) use of insulin: Secondary | ICD-10-CM | POA: Diagnosis not present

## 2017-10-15 DIAGNOSIS — Z7901 Long term (current) use of anticoagulants: Secondary | ICD-10-CM

## 2017-10-15 NOTE — Progress Notes (Signed)
CC:  My hip is better  Her right hip trochanteric pain is improved.  She is walking better  She has much less tenderness of the right hip laterally. ROM is full.  NV intact.  Encounter Diagnoses  Name Primary?  . Trochanteric bursitis of right hip Yes  . On warfarin therapy   . Type 2 diabetes mellitus with diabetic neuropathy, with long-term current use of insulin (HCC)    Continue heat/ice.  Return in one month.  Call if any problem.  Precautions discussed.   Electronically Signed Sanjuana Kava, MD 1/17/20192:45 PM

## 2017-10-15 NOTE — Progress Notes (Signed)
Patient is a 72 year old female who returns for follow-up today.  She was last seen September 03, 2017 with bilateral calf wounds.  She states the left wound has now completely healed.  She is currently being followed at the wound center.  She has known moderate superficial femoral artery occlusive disease in the right leg.  Other complicating factors include the fact that she is on warfarin for atrial fibrillation.  She also has renal dysfunction and is CKD 4.  She is followed by Dr. Moshe Cipro for this.  Her most recent serum creatinine in October was 2.7.  She denies any pain in her feet or claudication symptoms.  Review of systems: She denies shortness of breath.  She denies chest pain.  Physical exam:  Vitals:   10/15/17 1051  BP: (!) 149/60  Pulse: 70  Resp: 18  Temp: 97.6 F (36.4 C)  TempSrc: Oral  SpO2: 98%  Weight: 190 lb 8 oz (86.4 kg)  Height: 5\' 4"  (1.626 m)    Extremities: 2+ femoral absent popliteal and pedal pulses bilaterally     Assessment: Slowly healing wound right leg left leg wound is now completely healed in light of the fact that the patient has renal dysfunction and only mild to moderate SFA stenosis I believe the best option would be continued local wound care as long as this wound is continuing to improve.  It has healthy granulation tissue it is still about the same diameter but hopefully this will start to fill in from the bottom.  Plan: The patient will follow up with me in 6 weeks time.  If the wound deteriorates we would consider an arteriogram at that point.  [name], MD [facility] Office: [phone] Pager: [phone]

## 2017-10-18 DIAGNOSIS — E1140 Type 2 diabetes mellitus with diabetic neuropathy, unspecified: Secondary | ICD-10-CM | POA: Diagnosis not present

## 2017-10-18 DIAGNOSIS — E1122 Type 2 diabetes mellitus with diabetic chronic kidney disease: Secondary | ICD-10-CM | POA: Diagnosis not present

## 2017-10-18 DIAGNOSIS — Z79891 Long term (current) use of opiate analgesic: Secondary | ICD-10-CM | POA: Diagnosis not present

## 2017-10-18 DIAGNOSIS — N184 Chronic kidney disease, stage 4 (severe): Secondary | ICD-10-CM | POA: Diagnosis not present

## 2017-10-18 DIAGNOSIS — L97929 Non-pressure chronic ulcer of unspecified part of left lower leg with unspecified severity: Secondary | ICD-10-CM | POA: Diagnosis not present

## 2017-10-18 DIAGNOSIS — M71551 Other bursitis, not elsewhere classified, right hip: Secondary | ICD-10-CM | POA: Diagnosis not present

## 2017-10-18 DIAGNOSIS — Z4800 Encounter for change or removal of nonsurgical wound dressing: Secondary | ICD-10-CM | POA: Diagnosis not present

## 2017-10-18 DIAGNOSIS — E1151 Type 2 diabetes mellitus with diabetic peripheral angiopathy without gangrene: Secondary | ICD-10-CM | POA: Diagnosis not present

## 2017-10-18 DIAGNOSIS — L97919 Non-pressure chronic ulcer of unspecified part of right lower leg with unspecified severity: Secondary | ICD-10-CM | POA: Diagnosis not present

## 2017-10-18 DIAGNOSIS — Z7951 Long term (current) use of inhaled steroids: Secondary | ICD-10-CM | POA: Diagnosis not present

## 2017-10-18 DIAGNOSIS — Z794 Long term (current) use of insulin: Secondary | ICD-10-CM | POA: Diagnosis not present

## 2017-10-18 DIAGNOSIS — I4891 Unspecified atrial fibrillation: Secondary | ICD-10-CM | POA: Diagnosis not present

## 2017-10-18 DIAGNOSIS — Z7982 Long term (current) use of aspirin: Secondary | ICD-10-CM | POA: Diagnosis not present

## 2017-10-18 DIAGNOSIS — Z7901 Long term (current) use of anticoagulants: Secondary | ICD-10-CM | POA: Diagnosis not present

## 2017-10-20 ENCOUNTER — Ambulatory Visit (HOSPITAL_COMMUNITY): Payer: Medicare Other | Admitting: Physical Therapy

## 2017-10-20 ENCOUNTER — Other Ambulatory Visit: Payer: Self-pay | Admitting: "Endocrinology

## 2017-10-20 DIAGNOSIS — Z794 Long term (current) use of insulin: Secondary | ICD-10-CM | POA: Diagnosis not present

## 2017-10-20 DIAGNOSIS — E1122 Type 2 diabetes mellitus with diabetic chronic kidney disease: Secondary | ICD-10-CM | POA: Diagnosis not present

## 2017-10-20 DIAGNOSIS — N184 Chronic kidney disease, stage 4 (severe): Secondary | ICD-10-CM | POA: Diagnosis not present

## 2017-10-21 ENCOUNTER — Other Ambulatory Visit: Payer: Self-pay

## 2017-10-21 ENCOUNTER — Inpatient Hospital Stay (HOSPITAL_COMMUNITY): Payer: Medicare Other

## 2017-10-21 ENCOUNTER — Encounter (HOSPITAL_COMMUNITY): Payer: Self-pay

## 2017-10-21 VITALS — BP 152/77 | HR 69 | Temp 98.6°F | Resp 20

## 2017-10-21 DIAGNOSIS — N058 Unspecified nephritic syndrome with other morphologic changes: Secondary | ICD-10-CM

## 2017-10-21 DIAGNOSIS — D631 Anemia in chronic kidney disease: Secondary | ICD-10-CM

## 2017-10-21 DIAGNOSIS — D508 Other iron deficiency anemias: Secondary | ICD-10-CM

## 2017-10-21 DIAGNOSIS — N184 Chronic kidney disease, stage 4 (severe): Principal | ICD-10-CM

## 2017-10-21 LAB — CBC WITH DIFFERENTIAL/PLATELET
Basophils Absolute: 0 10*3/uL (ref 0.0–0.1)
Basophils Relative: 0 %
EOS ABS: 0.1 10*3/uL (ref 0.0–0.7)
EOS PCT: 1 %
HCT: 36.2 % (ref 36.0–46.0)
Hemoglobin: 11 g/dL — ABNORMAL LOW (ref 12.0–15.0)
Lymphocytes Relative: 17 %
Lymphs Abs: 0.8 10*3/uL (ref 0.7–4.0)
MCH: 27.4 pg (ref 26.0–34.0)
MCHC: 30.4 g/dL (ref 30.0–36.0)
MCV: 90.3 fL (ref 78.0–100.0)
MONO ABS: 0.3 10*3/uL (ref 0.1–1.0)
Monocytes Relative: 6 %
Neutro Abs: 3.9 10*3/uL (ref 1.7–7.7)
Neutrophils Relative %: 76 %
PLATELETS: 187 10*3/uL (ref 150–400)
RBC: 4.01 MIL/uL (ref 3.87–5.11)
RDW: 18.5 % — AB (ref 11.5–15.5)
WBC: 5 10*3/uL (ref 4.0–10.5)

## 2017-10-21 LAB — COMPREHENSIVE METABOLIC PANEL
A/G RATIO: 1 — AB (ref 1.2–2.2)
ALK PHOS: 69 IU/L (ref 39–117)
ALT: 24 IU/L (ref 0–32)
ALT: 25 U/L (ref 14–54)
AST: 24 IU/L (ref 0–40)
AST: 28 U/L (ref 15–41)
Albumin: 3.3 g/dL — ABNORMAL LOW (ref 3.5–4.8)
Albumin: 2.8 g/dL — ABNORMAL LOW (ref 3.5–5.0)
Alkaline Phosphatase: 56 U/L (ref 38–126)
Anion gap: 11 (ref 5–15)
BILIRUBIN TOTAL: 0.2 mg/dL (ref 0.0–1.2)
BUN/Creatinine Ratio: 31 — ABNORMAL HIGH (ref 12–28)
BUN: 68 mg/dL — ABNORMAL HIGH (ref 8–27)
BUN: 69 mg/dL — ABNORMAL HIGH (ref 6–20)
CALCIUM: 8.7 mg/dL (ref 8.7–10.3)
CHLORIDE: 108 mmol/L — AB (ref 96–106)
CHLORIDE: 105 mmol/L (ref 101–111)
CO2: 24 mmol/L (ref 20–29)
CO2: 25 mmol/L (ref 22–32)
CREATININE: 2.23 mg/dL — AB (ref 0.44–1.00)
Calcium: 8.4 mg/dL — ABNORMAL LOW (ref 8.9–10.3)
Creatinine, Ser: 2.18 mg/dL — ABNORMAL HIGH (ref 0.57–1.00)
GFR calc Af Amer: 26 mL/min/{1.73_m2} — ABNORMAL LOW (ref >59)
GFR calc non Af Amer: 22 mL/min/{1.73_m2} — ABNORMAL LOW (ref >59)
GFR calc non Af Amer: 21 mL/min — ABNORMAL LOW (ref >60)
GFR, EST AFRICAN AMERICAN: 24 mL/min — AB (ref >60)
Globulin, Total: 3.3 g/dL (ref 1.5–4.5)
Glucose, Bld: 71 mg/dL (ref 65–99)
Glucose: 51 mg/dL — ABNORMAL LOW (ref 65–99)
POTASSIUM: 3.6 mmol/L (ref 3.5–5.2)
POTASSIUM: 3.5 mmol/L (ref 3.5–5.1)
SODIUM: 146 mmol/L — AB (ref 134–144)
Sodium: 141 mmol/L (ref 135–145)
Total Bilirubin: 0.3 mg/dL (ref 0.3–1.2)
Total Protein: 6.6 g/dL (ref 6.0–8.5)
Total Protein: 7.1 g/dL (ref 6.5–8.1)

## 2017-10-21 LAB — IRON AND TIBC
IRON: 35 ug/dL (ref 28–170)
SATURATION RATIOS: 17 % (ref 10.4–31.8)
TIBC: 207 ug/dL — ABNORMAL LOW (ref 250–450)
UIBC: 172 ug/dL

## 2017-10-21 LAB — FERRITIN: FERRITIN: 240 ng/mL (ref 11–307)

## 2017-10-21 LAB — RETICULOCYTES
RBC.: 4.01 MIL/uL (ref 3.87–5.11)
Retic Count, Absolute: 28.1 10*3/uL (ref 19.0–186.0)
Retic Ct Pct: 0.7 % (ref 0.4–3.1)

## 2017-10-21 LAB — HGB A1C W/O EAG: HEMOGLOBIN A1C: 5.6 % (ref 4.8–5.6)

## 2017-10-21 LAB — VITAMIN B12: VITAMIN B 12: 1484 pg/mL — AB (ref 180–914)

## 2017-10-21 LAB — SPECIMEN STATUS REPORT

## 2017-10-21 MED ORDER — DARBEPOETIN ALFA 500 MCG/ML IJ SOSY
500.0000 ug | PREFILLED_SYRINGE | Freq: Once | INTRAMUSCULAR | Status: AC
  Administered 2017-10-21: 500 ug via SUBCUTANEOUS

## 2017-10-21 MED ORDER — DARBEPOETIN ALFA 500 MCG/ML IJ SOSY
PREFILLED_SYRINGE | INTRAMUSCULAR | Status: AC
  Filled 2017-10-21: qty 1

## 2017-10-21 NOTE — Patient Instructions (Signed)
[place] at [facility] Discharge Instructions  RECOMMENDATIONS MADE BY THE CONSULTANT AND ANY TEST RESULTS WILL BE SENT TO YOUR REFERRING PHYSICIAN.  Your hemoglobin today was 11.0 You got your Aranesp injection Follow up as scheduled.  Thank you for choosing [place] at [facility] to provide your oncology and hematology care.  To afford each patient quality time with our provider, please arrive at least 15 minutes before your scheduled appointment time.    If you have a lab appointment with the [facility] please come in thru the  Main Entrance and check in at the main information desk  You need to re-schedule your appointment should you arrive 10 or more minutes late.  We strive to give you quality time with our providers, and arriving late affects you and other patients whose appointments are after yours.  Also, if you no show three or more times for appointments you may be dismissed from the clinic at the providers discretion.     Again, thank you for choosing [facility].  Our hope is that these requests will decrease the amount of time that you wait before being seen by our physicians.       _____________________________________________________________  Should you have questions after your visit to [facility], please contact our office at [phone] between the hours of 8:30 a.m. and 4:30 p.m.  Voicemails left after 4:30 p.m. will not be returned until the following business day.  For prescription refill requests, have your pharmacy contact our office.       Resources For Cancer Patients and their Caregivers ? American Cancer Society: Can assist with transportation, wigs, general needs, runs Look Good Feel Better.        534-315-8550 ? Cancer Care: Provides financial assistance, online support groups, medication/co-pay assistance.  1-800-813-HOPE 763 027 8361) ? [employer] Assists [place] cancer patients and their families through emotional , educational and financial support.  5027057807 ? [place] DSS Where to apply for food stamps, Medicaid and utility assistance. 707 022 4055 ? [employer]: Transportation to medical appointments. 605-400-0822 ? Social Security Administration: May apply for disability if have a Stage IV cancer. 314-250-6060 201-064-5816 ? [employer], Disability and Transit Services: Assists with nutrition, care and transit needs. [place] Support Programs: @10RELATIVEDAYS @ > Cancer Support Group  2nd Tuesday of the month 1pm-2pm, Journey Room  > Creative Journey  3rd Tuesday of the month 1130am-1pm, Journey Room  > Look Good Feel Better  1st Wednesday of the month 10am-12 noon, Journey Room (Call [place] to register [phone])

## 2017-10-21 NOTE — Progress Notes (Signed)
[name] presents today for injection per MD orders. Aranesp 500 mcg administered SQ in right lower abdomen. Administration without incident. Patient tolerated well. Patient discharged in stable condition via wheelchair with spouse. Patient to follow up as scheduled.

## 2017-10-22 DIAGNOSIS — Z4800 Encounter for change or removal of nonsurgical wound dressing: Secondary | ICD-10-CM | POA: Diagnosis not present

## 2017-10-22 DIAGNOSIS — M71551 Other bursitis, not elsewhere classified, right hip: Secondary | ICD-10-CM | POA: Diagnosis not present

## 2017-10-22 DIAGNOSIS — E1122 Type 2 diabetes mellitus with diabetic chronic kidney disease: Secondary | ICD-10-CM | POA: Diagnosis not present

## 2017-10-22 DIAGNOSIS — Z7901 Long term (current) use of anticoagulants: Secondary | ICD-10-CM | POA: Diagnosis not present

## 2017-10-22 DIAGNOSIS — E1151 Type 2 diabetes mellitus with diabetic peripheral angiopathy without gangrene: Secondary | ICD-10-CM | POA: Diagnosis not present

## 2017-10-22 DIAGNOSIS — N184 Chronic kidney disease, stage 4 (severe): Secondary | ICD-10-CM | POA: Diagnosis not present

## 2017-10-22 DIAGNOSIS — Z7951 Long term (current) use of inhaled steroids: Secondary | ICD-10-CM | POA: Diagnosis not present

## 2017-10-22 DIAGNOSIS — L97929 Non-pressure chronic ulcer of unspecified part of left lower leg with unspecified severity: Secondary | ICD-10-CM | POA: Diagnosis not present

## 2017-10-22 DIAGNOSIS — I4891 Unspecified atrial fibrillation: Secondary | ICD-10-CM | POA: Diagnosis not present

## 2017-10-22 DIAGNOSIS — Z79891 Long term (current) use of opiate analgesic: Secondary | ICD-10-CM | POA: Diagnosis not present

## 2017-10-22 DIAGNOSIS — Z7982 Long term (current) use of aspirin: Secondary | ICD-10-CM | POA: Diagnosis not present

## 2017-10-22 DIAGNOSIS — E1140 Type 2 diabetes mellitus with diabetic neuropathy, unspecified: Secondary | ICD-10-CM | POA: Diagnosis not present

## 2017-10-22 DIAGNOSIS — L97919 Non-pressure chronic ulcer of unspecified part of right lower leg with unspecified severity: Secondary | ICD-10-CM | POA: Diagnosis not present

## 2017-10-22 DIAGNOSIS — Z794 Long term (current) use of insulin: Secondary | ICD-10-CM | POA: Diagnosis not present

## 2017-10-22 LAB — ERYTHROPOIETIN: ERYTHROPOIETIN: 18.2 m[IU]/mL (ref 2.6–18.5)

## 2017-10-22 LAB — FOLATE: Folate: >100 ng/mL (ref >5.9)

## 2017-10-23 ENCOUNTER — Other Ambulatory Visit: Payer: Self-pay | Admitting: Cardiology

## 2017-10-27 ENCOUNTER — Ambulatory Visit: Payer: Medicare Other | Admitting: "Endocrinology

## 2017-10-27 ENCOUNTER — Other Ambulatory Visit: Payer: Self-pay

## 2017-10-27 ENCOUNTER — Encounter: Payer: Self-pay | Admitting: "Endocrinology

## 2017-10-27 ENCOUNTER — Ambulatory Visit (HOSPITAL_COMMUNITY): Payer: Medicare Other | Admitting: Physical Therapy

## 2017-10-27 VITALS — BP 152/73 | HR 65 | Ht 64.0 in | Wt 188.0 lb

## 2017-10-27 DIAGNOSIS — I10 Essential (primary) hypertension: Secondary | ICD-10-CM

## 2017-10-27 DIAGNOSIS — E782 Mixed hyperlipidemia: Secondary | ICD-10-CM | POA: Diagnosis not present

## 2017-10-27 DIAGNOSIS — N184 Chronic kidney disease, stage 4 (severe): Secondary | ICD-10-CM

## 2017-10-27 DIAGNOSIS — Z794 Long term (current) use of insulin: Secondary | ICD-10-CM | POA: Diagnosis not present

## 2017-10-27 DIAGNOSIS — R262 Difficulty in walking, not elsewhere classified: Secondary | ICD-10-CM | POA: Diagnosis not present

## 2017-10-27 DIAGNOSIS — E6609 Other obesity due to excess calories: Secondary | ICD-10-CM | POA: Diagnosis not present

## 2017-10-27 DIAGNOSIS — Z6832 Body mass index (BMI) 32.0-32.9, adult: Secondary | ICD-10-CM | POA: Diagnosis not present

## 2017-10-27 DIAGNOSIS — S81801D Unspecified open wound, right lower leg, subsequent encounter: Secondary | ICD-10-CM | POA: Diagnosis not present

## 2017-10-27 DIAGNOSIS — S81802D Unspecified open wound, left lower leg, subsequent encounter: Secondary | ICD-10-CM | POA: Diagnosis not present

## 2017-10-27 DIAGNOSIS — M79605 Pain in left leg: Secondary | ICD-10-CM | POA: Diagnosis not present

## 2017-10-27 DIAGNOSIS — E1122 Type 2 diabetes mellitus with diabetic chronic kidney disease: Secondary | ICD-10-CM

## 2017-10-27 MED ORDER — INSULIN DETEMIR 100 UNIT/ML FLEXPEN: 10.0000 [IU] | PEN_INJECTOR | Freq: Every day | SUBCUTANEOUS | 2 refills | Status: DC

## 2017-10-27 NOTE — Progress Notes (Signed)
Subjective:    Patient ID: [name], female    DOB: 1946-02-27,    Past Medical History:  Diagnosis Date  . Anemia   . Arthritis   . Asthma   . Atrial fibrillation ([place])   . Atrial flutter [facility])    TEE/DCCV December 2013 - on Xarelto [facility])  . Bone spur of toe    Right 5th toe  . Chronic diastolic CHF (congestive heart failure) (HCC)    a. EF 50-55% by echo in 2015  . CKD (chronic Little disease) stage 4, GFR 15-29 ml/min (HCC)    a. s/p fistula placement but not on HD. Followed by Nephrology.   Marland Kitchen COPD (chronic obstructive pulmonary disease) ([place])   . DVT of upper extremity (deep vein thrombosis) ([place])    Right basilic vein, June 6213  . Essential hypertension, benign   . Fractures   . SLE (systemic lupus erythematosus) ([place])   . Type 2 diabetes mellitus ([place])   . UTI (lower urinary tract infection)    Past Surgical History:  Procedure Laterality Date  . ABDOMINAL HYSTERECTOMY  1983  . ANKLE SURGERY  1993  . AV FISTULA PLACEMENT Left 03/27/2014   Procedure: ARTERIOVENOUS (AV) FISTULA CREATION;  Surgeon: Rosetta Posner, MD;  Location: [place];  Service: Vascular;  Laterality: Left;  . BACK SURGERY  1980  . CARDIOVASCULAR STRESS TEST  12/19/2009   no stress induced rhythm abnormalities, ekg negative for ischemia  . CARDIOVERSION  09/16/2012   Procedure: CARDIOVERSION;  Surgeon: Sanda Klein, MD;  Location: [facility] ENDOSCOPY;  Service: Cardiovascular;  Laterality: N/A;  . COLONOSCOPY  09/20/2012   Dr. [name]: multiple tubular adenomas   . COLONOSCOPY  2005   Dr. [name]. Polyps, path unknown   . COLONOSCOPY N/A 07/24/2016   Procedure: COLONOSCOPY;  Surgeon: [name], MD;  [facility];  Service: Endoscopy;  Laterality: N/A;  9:00 am  . ESOPHAGOGASTRODUODENOSCOPY  09/20/2012   Dr. [name]: duodenal erosion and possible resolving ulcer at the angularis   . TEE WITHOUT CARDIOVERSION  09/16/2012   Procedure: TRANSESOPHAGEAL ECHOCARDIOGRAM (TEE);   Surgeon: Sanda Klein, MD;  Location: [facility] ENDOSCOPY;  Service: Cardiovascular;  Laterality: N/A;  . TRANSESOPHAGEAL ECHOCARDIOGRAM WITH CARDIOVERSION  09/16/2012   EF 60-65%, moderate LVH, moderate regurg of the aortic valve, LA moderately dilated   Social History   Socioeconomic History  . Marital status: Married    Spouse name: None  . Number of children: None  . Years of education: None  . Highest education level: None  Social Needs  . Financial resource strain: None  . Food insecurity - worry: None  . Food insecurity - inability: None  . Transportation needs - medical: None  . Transportation needs - non-medical: None  Occupational History  . None  Tobacco Use  . Smoking status: Former Smoker    Packs/day: 1.00    Years: 30.00    Pack years: 30.00    Types: Cigarettes    Last attempt to quit: 08/29/2012    Years since quitting: 5.1  . Smokeless tobacco: Never Used  . Tobacco comment: quit 08-2012  Substance and Sexual Activity  . Alcohol use: No    Alcohol/week: 0.0 oz  . Drug use: No  . Sexual activity: Yes    Partners: Male  Other Topics Concern  . None  Social History Narrative  . None   Outpatient Encounter Medications as of 10/27/2017  Medication Sig  . dicyclomine (BENTYL)  10 MG capsule Take 10 mg by mouth 4 (four) times daily -  before meals and at bedtime.  Marland Kitchen acetaminophen (TYLENOL) 500 MG tablet Take 1,000 mg by mouth every 6 (six) hours as needed. For pain  . albuterol (ACCUNEB) 0.63 MG/3ML nebulizer solution Take 1 ampule by nebulization every 6 (six) hours as needed for wheezing or shortness of breath.   Marland Kitchen albuterol (PROVENTIL HFA;VENTOLIN HFA) 108 (90 BASE) MCG/ACT inhaler Inhale 2 puffs into the lungs every 6 (six) hours as needed. For shortness of breath  . amLODipine (NORVASC) 10 MG tablet Take 1 tablet (10 mg total) by mouth at bedtime.  Marland Kitchen atorvastatin (LIPITOR) 40 MG tablet Take 40 mg at bedtime by mouth.   . brimonidine-timolol (COMBIGAN) 0.2-0.5  % ophthalmic solution Place 1 drop into the left eye daily.   . calcitRIOL (ROCALTROL) 0.25 MCG capsule Take 0.25 mcg by mouth as directed. Take on Mondays Wednesdays and Fridays  . diclofenac (VOLTAREN) 0.1 % ophthalmic solution Place 1 drop into the left eye daily. Uses at lunch time  . febuxostat (ULORIC) 40 MG tablet Take 40 mg by mouth daily.  . folic acid (FOLVITE) 1 MG tablet Take 1 mg by mouth daily.  . furosemide (LASIX) 80 MG tablet Take 160 mg by mouth 2 (two) times daily.   Marland Kitchen HUMALOG KWIKPEN 100 UNIT/ML KiwkPen INJECT UP TO 11 UNITS SUBCUTANEOUSLY BEFORE MEALS THREE TIMES DAILY.  Marland Kitchen HYDROcodone-acetaminophen (NORCO/VICODIN) 5-325 MG tablet 1 or 2 tabs PO q12 hours prn pain  . hydroxychloroquine (PLAQUENIL) 200 MG tablet Take 200 mg by mouth as directed. Take 1 1/2 tablets daily  . Insulin Detemir (LEVEMIR FLEXTOUCH) 100 UNIT/ML Pen Inject 10 Units into the skin daily at 10 pm.  . metoprolol tartrate (LOPRESSOR) 50 MG tablet TAKE ONE TABLET BY MOUTH TWICE DAILY.  . Multiple Vitamins-Minerals (CENTRUM SILVER PO) Take 1 tablet by mouth daily.  . ONE TOUCH ULTRA TEST test strip TEST 4 TIMES A DAY AS DIRECTED.  Marland Kitchen ONETOUCH DELICA LANCETS 00X MISC USE TO TEST 4 TIMES DAILY.  Marland Kitchen predniSONE (DELTASONE) 5 MG tablet Take 5 mg by mouth daily with breakfast.  . travoprost, benzalkonium, (TRAVATAN) 0.004 % ophthalmic solution Place 1 drop into the left eye at bedtime.  Marland Kitchen UNIFINE PENTIPS 31G X 6 MM MISC USE AS DIRECTED AT BEDTIME WITH LEVEMIR AND WITH SLIDING SCALE INSULIN.  Marland Kitchen Vitamin D, Ergocalciferol, (DRISDOL) 50000 units CAPS capsule TAKE 1 CAPSULE BY MOUTH ONCE WEEKLY.  . warfarin (COUMADIN) 5 MG tablet Take 1 tablet daily (Patient taking differently: Take 5 mg one time only at 6 PM by mouth. Take 1 tablet daily)  . [DISCONTINUED] Insulin Detemir (LEVEMIR FLEXTOUCH [place]) Inject 15 Units into the skin at bedtime.  . [DISCONTINUED] LEVEMIR FLEXTOUCH 100 UNIT/ML Pen INJECT 12 UNITS SUBCUTANEOUSLY AT  BEDTIME. (Patient taking differently: INJECT 10 UNITS SUBCUTANEOUSLY AT BEDTIME.)   No facility-administered encounter medications on file as of 10/27/2017.    ALLERGIES: Allergies  Allergen Reactions  . Ace Inhibitors Cough   VACCINATION STATUS: Immunization History  Administered Date(s) Administered  . Influenza,inj,Quad PF,6+ Mos 07/03/2014  . Influenza-Unspecified 07/11/2015, 07/08/2016    Diabetes  She presents for her follow-up diabetic visit. She has type 2 diabetes mellitus. Onset time: diagnosed at approx age of 24. Her disease course has been improving. Pertinent negatives for hypoglycemia include no confusion, headaches, pallor or seizures. Associated symptoms include fatigue. Pertinent negatives for diabetes include no chest pain, no polydipsia, no polyphagia and no  polyuria. Symptoms are improving. Diabetic complications include nephropathy. Risk factors for coronary artery disease include dyslipidemia, diabetes mellitus and obesity. She is compliant with treatment most of the time. Her weight is decreasing steadily. She is following a generally unhealthy diet. She has had a previous visit with a dietitian. She never participates in exercise. Her home blood glucose trend is increasing steadily. Her breakfast blood glucose range is generally 130-140 mg/dl. Her lunch blood glucose range is generally 140-180 mg/dl. Her dinner blood glucose range is generally 130-140 mg/dl. Her overall blood glucose range is 130-140 mg/dl. An ACE inhibitor/angiotensin II receptor blocker is being taken.  Hypertension  This is a chronic problem. The current episode started more than 1 year ago. The problem is uncontrolled. Pertinent negatives include no chest pain, headaches, palpitations or shortness of breath. Risk factors for coronary artery disease include obesity, dyslipidemia, diabetes mellitus and sedentary lifestyle.  Hyperlipidemia  This is a chronic problem. The current episode started more than  1 year ago. Exacerbating diseases include diabetes and obesity. Pertinent negatives include no chest pain, myalgias or shortness of breath. Current antihyperlipidemic treatment includes statins. Risk factors for coronary artery disease include diabetes mellitus, dyslipidemia, hypertension, a sedentary lifestyle, family history and post-menopausal.     Review of Systems  Constitutional: Positive for fatigue. Negative for unexpected weight change.  HENT: Negative for trouble swallowing and voice change.   Eyes: Negative for visual disturbance.  Respiratory: Negative for cough, shortness of breath and wheezing.   Cardiovascular: Negative for chest pain, palpitations and leg swelling.  Gastrointestinal: Negative for diarrhea, nausea and vomiting.  Endocrine: Negative for cold intolerance, heat intolerance, polydipsia, polyphagia and polyuria.  Musculoskeletal: Negative for arthralgias and myalgias.  Skin: Negative for color change, pallor, rash and wound.  Neurological: Negative for seizures and headaches.  Psychiatric/Behavioral: Negative for confusion and suicidal ideas.    Objective:    BP (!) 152/73   Pulse 65   Ht 5\' 4"  (1.626 m)   Wt 188 lb (85.3 kg)   BMI 32.27 kg/m   Wt Readings from Last 3 Encounters:  10/27/17 188 lb (85.3 kg)  10/15/17 190 lb 8 oz (86.4 kg)  09/30/17 187 lb 8 oz (85 kg)    Physical Exam  Constitutional: She is oriented to person, place, and time. She appears well-developed.  HENT:  Head: Normocephalic and atraumatic.  Eyes: EOM are normal.  Neck: Normal range of motion. Neck supple. No tracheal deviation present. No thyromegaly present.  Cardiovascular: Normal rate and regular rhythm.  Pulmonary/Chest: Effort normal and breath sounds normal.  Abdominal: Soft. Bowel sounds are normal. There is no tenderness. There is no guarding.  Musculoskeletal: Normal range of motion. She exhibits no edema.  Neurological: She is alert and oriented to person, place,  and time. She has normal reflexes. No cranial nerve deficit. Coordination normal.  Skin: Skin is warm and dry. No rash noted. No erythema. No pallor.  Psychiatric: She has a normal mood and affect. Judgment normal.    Chemistry (most recent): Lab Results  Component Value Date   NA 141 10/21/2017   K 3.5 10/21/2017   CL 105 10/21/2017   CO2 25 10/21/2017   BUN 69 (H) 10/21/2017   CREATININE 2.23 (H) 10/21/2017   Diabetic Labs (most recent): Lab Results  Component Value Date   HGBA1C 5.6 10/20/2017   HGBA1C 5.8 (H) 01/09/2017   HGBA1C 5.7 (H) 10/17/2016     Assessment & Plan:   1. Type 2 diabetes  mellitus with stage 4 chronic Little disease- status post fistula placement for hemodialysis. - She is not started on hemodialysis. Patient is advised to stick to a routine mealtimes to eat 3 meals  a day and avoid unnecessary snacks ( to snack only to correct hypoglycemia). - Her recent labs did not include A1c, during her last visit A1c was 5.6% however that was in sharp discrepancy from her blood glucose profile.   -Her blood glucose profile average is between 130 and 160 . - She has history of iron deficiency anemia, Getting iron infusion at [facility]. Patient is advised to eliminate simple carbs  from their diet including cakes desserts ice cream soda (  diet and regular) , sweet tea , Candies,  chips and cookies, artificial sweeteners,   and "sugar-free" products .  This will help patient to have stable blood glucose profile and potentially lose weight. Patient is given detailed personalized glucose monitoring and insulin dosing instructions. Patient is instructed to call back with extremes of blood glucose less than 70 or greater than 300. Patient to bring meter and  blood glucose logs during their next visit.  I will continue Levemir 10 units daily at bedtime,  continue Humalog 5 units  -She has a chance to come off of Humalog if she is willing to avoid concentrated  carbohydrates such as soda. She will benefit from continuous glucose monitoring. -I discussed and initiated a prescription for the [facility] device for her.  - She will call clinic if she registers greater than 300 mg/dL.  2. Essential hypertension, benign - Her blood pressure is not Controlled to target.  I have advised her to continue current medications including ARB.  3. Hyperlipemia No recent fasting lipid panel.  I have advised her to continue atorvastatatin.  4. Morbid obesity Carbs advise given. See above #1.  5. vitamin D deficiency.  -Continue current Vitamin D supplement.  6) Chronic Care/Health Maintenance:  -Patient is on  Statin medications and encouraged to continue to follow up with Ophthalmology, nephrology for end-stage renal disease status post fistula placement, Podiatrist at least yearly or according to recommendations, and advised to   stay away from smoking. I have recommended yearly flu vaccine and pneumonia vaccination at least every 5 years; and  sleep for at least 7 hours a day. - She is advised to avoid Vitamin B 12 supplement.   - Time spent with the patient: 25 min, of which >50% was spent in reviewing her blood glucose logs , discussing her hypo- and hyper-glycemic episodes, reviewing her current and  previous labs and insulin doses and developing a plan to avoid hypo- and hyper-glycemia. Please refer to Patient Instructions for Blood Glucose Monitoring and Insulin/Medications Dosing Guide"  in media tab for additional information.   Follow up plan: Return in about 3 months (around 01/25/2018) for follow up with pre-visit labs, meter, and logs.  Glade Lloyd, MD Phone: [phone]  Fax: [phone]  -  This note was partially dictated with voice recognition software. Similar sounding words can be transcribed inadequately or may not  be corrected upon review.  10/27/2017, 11:34 AM

## 2017-10-27 NOTE — Therapy (Signed)
[place], [place], [address] Phone: [phone]   Fax:  [phone]  Wound Care Therapy  Patient Details  Name: [name] MRN: [id number] Date of Birth: 09-30-1945 Referring Provider: Sharilyn Sites    Encounter Date: 10/27/2017  PT End of Session - 10/27/17 1354    Visit Number  32    Number of Visits  32    Date for PT Re-Evaluation  11/20/17    Authorization - Visit Number  53    Authorization - Number of Visits  32    PT Start Time  1300    PT Stop Time  1325    PT Time Calculation (min)  25 min    Activity Tolerance  Patient tolerated treatment well    Behavior During Therapy  [facility] for tasks assessed/performed       Past Medical History:  Diagnosis Date  . Anemia   . Arthritis   . Asthma   . Atrial fibrillation ([place])   . Atrial flutter [facility])    TEE/DCCV December 2013 - on Xarelto [facility])  . Bone spur of toe    Right 5th toe  . Chronic diastolic CHF (congestive heart failure) (HCC)    a. EF 50-55% by echo in 2015  . CKD (chronic kidney disease) stage 4, GFR 15-29 ml/min (HCC)    a. s/p fistula placement but not on HD. Followed by Nephrology.   Marland Kitchen COPD (chronic obstructive pulmonary disease) ([place])   . DVT of upper extremity (deep vein thrombosis) ([place])    Right basilic vein, June 3557  . Essential hypertension, benign   . Fractures   . SLE (systemic lupus erythematosus) ([place])   . Type 2 diabetes mellitus ([place])   . UTI (lower urinary tract infection)     Past Surgical History:  Procedure Laterality Date  . ABDOMINAL HYSTERECTOMY  1983  . ANKLE SURGERY  1993  . AV FISTULA PLACEMENT Left 03/27/2014   Procedure: ARTERIOVENOUS (AV) FISTULA CREATION;  Surgeon: Rosetta Posner, MD;  Location: [place];  Service: Vascular;  Laterality: Left;  . BACK SURGERY  1980  . CARDIOVASCULAR STRESS TEST  12/19/2009   no stress induced rhythm abnormalities, ekg negative for ischemia  . CARDIOVERSION   09/16/2012   Procedure: CARDIOVERSION;  Surgeon: Sanda Klein, MD;  Location: [facility] ENDOSCOPY;  Service: Cardiovascular;  Laterality: N/A;  . COLONOSCOPY  09/20/2012   Dr. [name]: multiple tubular adenomas   . COLONOSCOPY  2005   Dr. [name]. Polyps, path unknown   . COLONOSCOPY N/A 07/24/2016   Procedure: COLONOSCOPY;  Surgeon: [name], MD;  [facility];  Service: Endoscopy;  Laterality: N/A;  9:00 am  . ESOPHAGOGASTRODUODENOSCOPY  09/20/2012   Dr. [name]: duodenal erosion and possible resolving ulcer at the angularis   . TEE WITHOUT CARDIOVERSION  09/16/2012   Procedure: TRANSESOPHAGEAL ECHOCARDIOGRAM (TEE);  Surgeon: Sanda Klein, MD;  [facility] ENDOSCOPY;  Service: Cardiovascular;  Laterality: N/A;  . TRANSESOPHAGEAL ECHOCARDIOGRAM WITH CARDIOVERSION  09/16/2012   EF 60-65%, moderate LVH, moderate regurg of the aortic valve, LA moderately dilated    There were no vitals filed for this visit.              Wound Therapy - 10/27/17 1348    Subjective  Pt states she has no pain.  She has been caring for her wound at home.    Patient and Family Stated Goals  wounds to heal  Prior Treatments  self care and antibiotid    Pain Assessment  No/denies pain    Evaluation and Treatment Procedures Explained to Patient/Family  Yes    Evaluation and Treatment Procedures  agreed to    Wound Properties Date First Assessed: 06/25/17 Time First Assessed: 1100 Wound Type: Non-pressure wound Location: Leg Location Orientation: Posterior;Medial;Right Wound Description (Comments): blister   Dressing Type  Gauze (Comment)    Dressing Status  Old drainage    Dressing Change Frequency  PRN    Site / Wound Assessment  Red;Yellow    % Wound base Red or Granulating  95%    % Wound base Yellow/Fibrinous Exudate  5%    % Wound base Black/Eschar  0%    Peri-wound Assessment  Intact    Wound Length (cm)  0.9 cm    Wound Width (cm)  1.1 cm    Wound Depth (cm)  0.2 cm     Wound Volume (cm^3)  0.2 cm^3    Wound Surface Area (cm^2)  0.99 cm^2    Margins  Epibole (rolled edges)    Drainage Amount  Scant    Drainage Description  Serous;No odor    Selective Debridement - Location  Rt leg wound    Selective Debridement - Tools Used  Forceps    Selective Debridement - Tissue Removed  slough    Wound Therapy - Clinical Statement  Minimal slough needed to be debrided.  Therapist feels if pt would allow shower to beat down on the wound the slough would be removed.  Healing will be very slow at this point due to poor arterial flow.  Therapist and pt agreed that pt is able to take care of this wound on her own at this time and if anything changes she can contact her MD.  Therapist and pt went over the need to cleanse and redress every other day not daily.  Pt vocalized understanding.      Wound Therapy - Functional Problem List  difficulty in walking     Factors Delaying/Impairing Wound Healing  Altered sensation;Multiple medical problems;Vascular compromise    Hydrotherapy Plan  Debridement;Dressing change;Patient/family education    Wound Therapy - Frequency  -- 1/week    Wound Therapy - Current Recommendations  PT    Wound Plan  Pt to be discharged to self care.     Dressing   medihoney, 2x2 and medipore tape.               PT Education - 10/27/17 1354    Education provided  Yes    Education Details  self care     Person(s) Educated  Patient    Methods  Explanation    Comprehension  Verbalized understanding       PT Short Term Goals - 10/27/17 1356      PT SHORT TERM GOAL #1   Title  Pt to understand signs and symptoms of cellulits and the importance of contacting MD if this occurs     Time  1    Period  Weeks    Status  Achieved      PT SHORT TERM GOAL #2   Title  PT lateral wound to be 75% granulated and to have no black eschar to reduce the risk of infection.    Baseline  -- currently no black eschar; 60% granulated     Time  2    Period   Weeks    Status  Achieved  PT SHORT TERM GOAL #3   Title  Pt pain to be decreased to no greater than a 4/10 to allow pt to rest better    Baseline  Pain in LE 0/10 now but has RT hip pain which we are not seeing pt for  07/09/2017:  pain is no greater than a 4/10 except when debriding.  Pt unable to walk 10 minutes due to CHF revised goal to be so that pt can rest better.     Time  3    Period  Weeks    Status  Achieved        PT Long Term Goals - 10/27/17 1356      PT LONG TERM GOAL #1   Title  Pt wound to be 100% granulated to prevent infection     Baseline  Rt posterior wound 95% granulated; Lt healed     Time  4    Period  Weeks    Status  Partially Met      PT LONG TERM GOAL #2   Title  PT lateral  wound size to be no greater than 2x2 to allow pt to feel comfortable in self care of wound ; anterior wound to be healed     Baseline  11/29 - 1.6cm x 1.1 cm    Time  6    Period  Weeks    Status  Achieved      PT LONG TERM GOAL #3   Title  If profore felt comfortable to pt pt to be educated in compression garments and why they may be benefical for pt.  Pt has had wounds come up on her Rt leg in the past as well     Baseline  ABI severe blockage no compression  07/09/2017:  Await result of ABI    Time  6    Period  Weeks    Status  Revised            Plan - 10/27/17 1355    Clinical Impression Statement  see above     Rehab Potential  Good    PT Frequency  Biweekly    PT Duration  6 weeks    PT Treatment/Interventions  Other (comment) debridement and dressing change     PT Next Visit Plan  Discharge to self care .    Consulted and Agree with Plan of Care  Patient       Patient will benefit from skilled therapeutic intervention in order to improve the following deficits and impairments:  Decreased activity tolerance, Increased edema, Difficulty walking, Pain  Visit Diagnosis: Leg wound, right, subsequent encounter     Problem List Patient Active  Problem List   Diagnosis Date Noted  . Iron deficiency anemia 08/07/2017  . Heme positive stool 10/04/2015  . Mixed hyperlipidemia 06/29/2015  . Type 2 diabetes mellitus with stage 4 chronic kidney disease ([place]) 06/29/2015  . Encounter for adequacy testing for hemodialysis ([place]) 04/12/2014  . Chronic kidney disease, stage V ([place]) 04/12/2014  . 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 Encounter for therapeutic drug monitoring 03/30/2014  . Chronic diastolic heart failure ([place]) 03/15/2014  . Renal failure (ARF), acute on chronic (HCC) 03/13/2014  . Anemia of chronic renal failure, stage 4 (severe) ([place]) 02/23/2014  . Folate deficiency 02/17/2014  . Aortic regurgitation 09/19/2012  . Atrial flutter ([place]) 09/14/2012  . Class 2 severe obesity due to excess calories with serious comorbidity and body mass  index (BMI) of 35.0 to 35.9 in adult [facility]) 09/14/2012  . Type 2 diabetes mellitus with diabetic neuropathy ([place]) 09/14/2012  . SLE (systemic lupus erythematosus) ([place]) 09/14/2012  . Essential hypertension, benign 09/14/2012  . COPD (chronic obstructive pulmonary disease) ([place]) 09/14/2012    Rayetta Humphrey, PT CLT [phone] 10/27/2017, 1:58 PM  [facility] [address] [place], [place], [address] Phone: [phone]   Fax:  [phone]  Name: [name] MRN: [id number] Date of Birth: 1946-04-26  PHYSICAL THERAPY DISCHARGE SUMMARY  Visits from Start of Care: 32  Current functional level related to goals / functional outcomes: See above    Remaining deficits: See above   Education / Equipment: Self care  Plan: Patient agrees to discharge.  Patient goals were partially met. Patient is being discharged due to a change in medical status.  ?????        Initial wound that pt had been referred for is healed.  Pt has had an ABI which demonstrates severe arterial  blockage In her right LE therefore healing will be very slowly.  Pt shows no signs of infection and is able to complete self care for  This wound.   Rayetta Humphrey, [place] CLT [phone]

## 2017-10-27 NOTE — Patient Instructions (Signed)

## 2017-10-28 ENCOUNTER — Ambulatory Visit (HOSPITAL_COMMUNITY): Payer: Medicare Other

## 2017-10-28 ENCOUNTER — Other Ambulatory Visit (HOSPITAL_COMMUNITY): Payer: Medicare Other

## 2017-10-28 ENCOUNTER — Other Ambulatory Visit: Payer: Self-pay | Admitting: "Endocrinology

## 2017-10-28 ENCOUNTER — Ambulatory Visit (HOSPITAL_COMMUNITY): Payer: Medicare Other | Admitting: Internal Medicine

## 2017-10-30 DIAGNOSIS — L97919 Non-pressure chronic ulcer of unspecified part of right lower leg with unspecified severity: Secondary | ICD-10-CM | POA: Diagnosis not present

## 2017-10-30 DIAGNOSIS — M71551 Other bursitis, not elsewhere classified, right hip: Secondary | ICD-10-CM | POA: Diagnosis not present

## 2017-10-30 DIAGNOSIS — Z7982 Long term (current) use of aspirin: Secondary | ICD-10-CM | POA: Diagnosis not present

## 2017-10-30 DIAGNOSIS — Z79891 Long term (current) use of opiate analgesic: Secondary | ICD-10-CM | POA: Diagnosis not present

## 2017-10-30 DIAGNOSIS — Z7901 Long term (current) use of anticoagulants: Secondary | ICD-10-CM | POA: Diagnosis not present

## 2017-10-30 DIAGNOSIS — N186 End stage renal disease: Secondary | ICD-10-CM | POA: Diagnosis not present

## 2017-10-30 DIAGNOSIS — Z794 Long term (current) use of insulin: Secondary | ICD-10-CM | POA: Diagnosis not present

## 2017-10-30 DIAGNOSIS — I4891 Unspecified atrial fibrillation: Secondary | ICD-10-CM | POA: Diagnosis not present

## 2017-10-30 DIAGNOSIS — N184 Chronic kidney disease, stage 4 (severe): Secondary | ICD-10-CM | POA: Diagnosis not present

## 2017-10-30 DIAGNOSIS — Z4800 Encounter for change or removal of nonsurgical wound dressing: Secondary | ICD-10-CM | POA: Diagnosis not present

## 2017-10-30 DIAGNOSIS — E1122 Type 2 diabetes mellitus with diabetic chronic kidney disease: Secondary | ICD-10-CM | POA: Diagnosis not present

## 2017-10-30 DIAGNOSIS — E1129 Type 2 diabetes mellitus with other diabetic kidney complication: Secondary | ICD-10-CM | POA: Diagnosis not present

## 2017-10-30 DIAGNOSIS — Z7951 Long term (current) use of inhaled steroids: Secondary | ICD-10-CM | POA: Diagnosis not present

## 2017-10-30 DIAGNOSIS — E1140 Type 2 diabetes mellitus with diabetic neuropathy, unspecified: Secondary | ICD-10-CM | POA: Diagnosis not present

## 2017-10-30 DIAGNOSIS — L97929 Non-pressure chronic ulcer of unspecified part of left lower leg with unspecified severity: Secondary | ICD-10-CM | POA: Diagnosis not present

## 2017-10-30 DIAGNOSIS — Z992 Dependence on renal dialysis: Secondary | ICD-10-CM | POA: Diagnosis not present

## 2017-10-30 DIAGNOSIS — E1151 Type 2 diabetes mellitus with diabetic peripheral angiopathy without gangrene: Secondary | ICD-10-CM | POA: Diagnosis not present

## 2017-11-02 ENCOUNTER — Encounter (HOSPITAL_COMMUNITY): Payer: Self-pay | Admitting: Emergency Medicine

## 2017-11-02 ENCOUNTER — Emergency Department (HOSPITAL_COMMUNITY): Payer: Medicare Other

## 2017-11-02 ENCOUNTER — Other Ambulatory Visit: Payer: Self-pay

## 2017-11-02 ENCOUNTER — Inpatient Hospital Stay (HOSPITAL_COMMUNITY)
Admission: EM | Admit: 2017-11-02 | Discharge: 2017-11-13 | DRG: 177 | Disposition: A | Discharge status: Home / Self Care | Payer: Medicare Other | Attending: Internal Medicine | Admitting: Internal Medicine

## 2017-11-02 DIAGNOSIS — S81801D Unspecified open wound, right lower leg, subsequent encounter: Secondary | ICD-10-CM | POA: Diagnosis not present

## 2017-11-02 DIAGNOSIS — Z23 Encounter for immunization: Secondary | ICD-10-CM | POA: Diagnosis not present

## 2017-11-02 DIAGNOSIS — L97929 Non-pressure chronic ulcer of unspecified part of left lower leg with unspecified severity: Secondary | ICD-10-CM | POA: Diagnosis not present

## 2017-11-02 DIAGNOSIS — Z86718 Personal history of other venous thrombosis and embolism: Secondary | ICD-10-CM

## 2017-11-02 DIAGNOSIS — R262 Difficulty in walking, not elsewhere classified: Secondary | ICD-10-CM | POA: Diagnosis not present

## 2017-11-02 DIAGNOSIS — N183 Chronic kidney disease, stage 3 unspecified: Secondary | ICD-10-CM

## 2017-11-02 DIAGNOSIS — N031 Chronic nephritic syndrome with focal and segmental glomerular lesions: Secondary | ICD-10-CM | POA: Diagnosis not present

## 2017-11-02 DIAGNOSIS — E782 Mixed hyperlipidemia: Secondary | ICD-10-CM | POA: Diagnosis not present

## 2017-11-02 DIAGNOSIS — M6281 Muscle weakness (generalized): Secondary | ICD-10-CM | POA: Diagnosis not present

## 2017-11-02 DIAGNOSIS — Z7982 Long term (current) use of aspirin: Secondary | ICD-10-CM | POA: Diagnosis not present

## 2017-11-02 DIAGNOSIS — R05 Cough: Secondary | ICD-10-CM | POA: Diagnosis not present

## 2017-11-02 DIAGNOSIS — J189 Pneumonia, unspecified organism: Secondary | ICD-10-CM | POA: Diagnosis not present

## 2017-11-02 DIAGNOSIS — N179 Acute kidney failure, unspecified: Secondary | ICD-10-CM | POA: Diagnosis not present

## 2017-11-02 DIAGNOSIS — L97919 Non-pressure chronic ulcer of unspecified part of right lower leg with unspecified severity: Secondary | ICD-10-CM | POA: Diagnosis not present

## 2017-11-02 DIAGNOSIS — I5032 Chronic diastolic (congestive) heart failure: Secondary | ICD-10-CM | POA: Diagnosis present

## 2017-11-02 DIAGNOSIS — Z4901 Encounter for fitting and adjustment of extracorporeal dialysis catheter: Secondary | ICD-10-CM | POA: Diagnosis not present

## 2017-11-02 DIAGNOSIS — E11649 Type 2 diabetes mellitus with hypoglycemia without coma: Secondary | ICD-10-CM | POA: Diagnosis present

## 2017-11-02 DIAGNOSIS — B370 Candidal stomatitis: Secondary | ICD-10-CM | POA: Diagnosis not present

## 2017-11-02 DIAGNOSIS — I509 Heart failure, unspecified: Secondary | ICD-10-CM | POA: Diagnosis not present

## 2017-11-02 DIAGNOSIS — Z4800 Encounter for change or removal of nonsurgical wound dressing: Secondary | ICD-10-CM | POA: Diagnosis not present

## 2017-11-02 DIAGNOSIS — E1122 Type 2 diabetes mellitus with diabetic chronic kidney disease: Secondary | ICD-10-CM | POA: Diagnosis not present

## 2017-11-02 DIAGNOSIS — J9600 Acute respiratory failure, unspecified whether with hypoxia or hypercapnia: Secondary | ICD-10-CM

## 2017-11-02 DIAGNOSIS — I10 Essential (primary) hypertension: Secondary | ICD-10-CM | POA: Diagnosis not present

## 2017-11-02 DIAGNOSIS — I480 Paroxysmal atrial fibrillation: Secondary | ICD-10-CM | POA: Diagnosis not present

## 2017-11-02 DIAGNOSIS — D631 Anemia in chronic kidney disease: Secondary | ICD-10-CM | POA: Diagnosis not present

## 2017-11-02 DIAGNOSIS — J441 Chronic obstructive pulmonary disease with (acute) exacerbation: Secondary | ICD-10-CM | POA: Diagnosis not present

## 2017-11-02 DIAGNOSIS — R112 Nausea with vomiting, unspecified: Secondary | ICD-10-CM | POA: Diagnosis not present

## 2017-11-02 DIAGNOSIS — Z90710 Acquired absence of both cervix and uterus: Secondary | ICD-10-CM | POA: Diagnosis not present

## 2017-11-02 DIAGNOSIS — Z833 Family history of diabetes mellitus: Secondary | ICD-10-CM | POA: Diagnosis not present

## 2017-11-02 DIAGNOSIS — Z7901 Long term (current) use of anticoagulants: Secondary | ICD-10-CM

## 2017-11-02 DIAGNOSIS — R0602 Shortness of breath: Secondary | ICD-10-CM | POA: Diagnosis not present

## 2017-11-02 DIAGNOSIS — J690 Pneumonitis due to inhalation of food and vomit: Secondary | ICD-10-CM | POA: Diagnosis not present

## 2017-11-02 DIAGNOSIS — E1129 Type 2 diabetes mellitus with other diabetic kidney complication: Secondary | ICD-10-CM | POA: Diagnosis not present

## 2017-11-02 DIAGNOSIS — I361 Nonrheumatic tricuspid (valve) insufficiency: Secondary | ICD-10-CM | POA: Diagnosis not present

## 2017-11-02 DIAGNOSIS — R269 Unspecified abnormalities of gait and mobility: Secondary | ICD-10-CM | POA: Diagnosis not present

## 2017-11-02 DIAGNOSIS — N390 Urinary tract infection, site not specified: Secondary | ICD-10-CM | POA: Diagnosis not present

## 2017-11-02 DIAGNOSIS — E1151 Type 2 diabetes mellitus with diabetic peripheral angiopathy without gangrene: Secondary | ICD-10-CM | POA: Diagnosis not present

## 2017-11-02 DIAGNOSIS — Z79891 Long term (current) use of opiate analgesic: Secondary | ICD-10-CM | POA: Diagnosis not present

## 2017-11-02 DIAGNOSIS — Z794 Long term (current) use of insulin: Secondary | ICD-10-CM | POA: Diagnosis not present

## 2017-11-02 DIAGNOSIS — J440 Chronic obstructive pulmonary disease with acute lower respiratory infection: Secondary | ICD-10-CM | POA: Diagnosis present

## 2017-11-02 DIAGNOSIS — Z7951 Long term (current) use of inhaled steroids: Secondary | ICD-10-CM | POA: Diagnosis not present

## 2017-11-02 DIAGNOSIS — J181 Lobar pneumonia, unspecified organism: Secondary | ICD-10-CM | POA: Diagnosis not present

## 2017-11-02 DIAGNOSIS — E872 Acidosis: Secondary | ICD-10-CM | POA: Diagnosis not present

## 2017-11-02 DIAGNOSIS — E869 Volume depletion, unspecified: Secondary | ICD-10-CM | POA: Diagnosis present

## 2017-11-02 DIAGNOSIS — M3210 Systemic lupus erythematosus, organ or system involvement unspecified: Secondary | ICD-10-CM | POA: Diagnosis not present

## 2017-11-02 DIAGNOSIS — D509 Iron deficiency anemia, unspecified: Secondary | ICD-10-CM | POA: Diagnosis not present

## 2017-11-02 DIAGNOSIS — E1140 Type 2 diabetes mellitus with diabetic neuropathy, unspecified: Secondary | ICD-10-CM | POA: Diagnosis not present

## 2017-11-02 DIAGNOSIS — Z7952 Long term (current) use of systemic steroids: Secondary | ICD-10-CM

## 2017-11-02 DIAGNOSIS — J9601 Acute respiratory failure with hypoxia: Secondary | ICD-10-CM | POA: Diagnosis not present

## 2017-11-02 DIAGNOSIS — Z978 Presence of other specified devices: Secondary | ICD-10-CM | POA: Diagnosis not present

## 2017-11-02 DIAGNOSIS — R1084 Generalized abdominal pain: Secondary | ICD-10-CM | POA: Diagnosis present

## 2017-11-02 DIAGNOSIS — J9611 Chronic respiratory failure with hypoxia: Secondary | ICD-10-CM | POA: Diagnosis not present

## 2017-11-02 DIAGNOSIS — I132 Hypertensive heart and chronic kidney disease with heart failure and with stage 5 chronic kidney disease, or end stage renal disease: Secondary | ICD-10-CM | POA: Diagnosis present

## 2017-11-02 DIAGNOSIS — N186 End stage renal disease: Secondary | ICD-10-CM | POA: Diagnosis not present

## 2017-11-02 DIAGNOSIS — N058 Unspecified nephritic syndrome with other morphologic changes: Secondary | ICD-10-CM | POA: Diagnosis present

## 2017-11-02 DIAGNOSIS — E785 Hyperlipidemia, unspecified: Secondary | ICD-10-CM | POA: Diagnosis not present

## 2017-11-02 DIAGNOSIS — M329 Systemic lupus erythematosus, unspecified: Secondary | ICD-10-CM | POA: Diagnosis not present

## 2017-11-02 DIAGNOSIS — B974 Respiratory syncytial virus as the cause of diseases classified elsewhere: Secondary | ICD-10-CM | POA: Diagnosis present

## 2017-11-02 DIAGNOSIS — J449 Chronic obstructive pulmonary disease, unspecified: Secondary | ICD-10-CM | POA: Diagnosis present

## 2017-11-02 DIAGNOSIS — M71551 Other bursitis, not elsewhere classified, right hip: Secondary | ICD-10-CM | POA: Diagnosis not present

## 2017-11-02 DIAGNOSIS — M1990 Unspecified osteoarthritis, unspecified site: Secondary | ICD-10-CM | POA: Diagnosis present

## 2017-11-02 DIAGNOSIS — Z87891 Personal history of nicotine dependence: Secondary | ICD-10-CM

## 2017-11-02 DIAGNOSIS — Z888 Allergy status to other drugs, medicaments and biological substances status: Secondary | ICD-10-CM

## 2017-11-02 DIAGNOSIS — R1110 Vomiting, unspecified: Secondary | ICD-10-CM | POA: Diagnosis not present

## 2017-11-02 DIAGNOSIS — I129 Hypertensive chronic kidney disease with stage 1 through stage 4 chronic kidney disease, or unspecified chronic kidney disease: Secondary | ICD-10-CM | POA: Diagnosis not present

## 2017-11-02 DIAGNOSIS — Z8249 Family history of ischemic heart disease and other diseases of the circulatory system: Secondary | ICD-10-CM

## 2017-11-02 DIAGNOSIS — E860 Dehydration: Secondary | ICD-10-CM | POA: Diagnosis present

## 2017-11-02 DIAGNOSIS — Z992 Dependence on renal dialysis: Secondary | ICD-10-CM | POA: Diagnosis not present

## 2017-11-02 DIAGNOSIS — J9690 Respiratory failure, unspecified, unspecified whether with hypoxia or hypercapnia: Secondary | ICD-10-CM | POA: Diagnosis not present

## 2017-11-02 DIAGNOSIS — N184 Chronic kidney disease, stage 4 (severe): Secondary | ICD-10-CM | POA: Diagnosis not present

## 2017-11-02 DIAGNOSIS — A084 Viral intestinal infection, unspecified: Secondary | ICD-10-CM | POA: Diagnosis present

## 2017-11-02 DIAGNOSIS — N189 Chronic kidney disease, unspecified: Secondary | ICD-10-CM

## 2017-11-02 DIAGNOSIS — R109 Unspecified abdominal pain: Secondary | ICD-10-CM | POA: Diagnosis not present

## 2017-11-02 DIAGNOSIS — I4892 Unspecified atrial flutter: Secondary | ICD-10-CM | POA: Diagnosis present

## 2017-11-02 DIAGNOSIS — Z823 Family history of stroke: Secondary | ICD-10-CM

## 2017-11-02 DIAGNOSIS — K5900 Constipation, unspecified: Secondary | ICD-10-CM | POA: Diagnosis not present

## 2017-11-02 DIAGNOSIS — I4891 Unspecified atrial fibrillation: Secondary | ICD-10-CM | POA: Diagnosis not present

## 2017-11-02 DIAGNOSIS — S81801A Unspecified open wound, right lower leg, initial encounter: Secondary | ICD-10-CM | POA: Diagnosis not present

## 2017-11-02 DIAGNOSIS — R1312 Dysphagia, oropharyngeal phase: Secondary | ICD-10-CM | POA: Diagnosis not present

## 2017-11-02 LAB — COMPREHENSIVE METABOLIC PANEL
ALT: 27 U/L (ref 14–54)
AST: 37 U/L (ref 15–41)
Albumin: 2.3 g/dL — ABNORMAL LOW (ref 3.5–5.0)
Alkaline Phosphatase: 108 U/L (ref 38–126)
Anion gap: 13 (ref 5–15)
BILIRUBIN TOTAL: 0.6 mg/dL (ref 0.3–1.2)
BUN: 90 mg/dL — ABNORMAL HIGH (ref 6–20)
CHLORIDE: 105 mmol/L (ref 101–111)
CO2: 18 mmol/L — ABNORMAL LOW (ref 22–32)
Calcium: 8.8 mg/dL — ABNORMAL LOW (ref 8.9–10.3)
Creatinine, Ser: 4 mg/dL — ABNORMAL HIGH (ref 0.44–1.00)
GFR, EST AFRICAN AMERICAN: 12 mL/min — AB (ref >60)
GFR, EST NON AFRICAN AMERICAN: 10 mL/min — AB (ref >60)
Glucose, Bld: 70 mg/dL (ref 65–99)
POTASSIUM: 3.9 mmol/L (ref 3.5–5.1)
Sodium: 136 mmol/L (ref 135–145)
TOTAL PROTEIN: 7.4 g/dL (ref 6.5–8.1)

## 2017-11-02 LAB — CBG MONITORING, ED
GLUCOSE-CAPILLARY: 190 mg/dL — AB (ref 65–99)
Glucose-Capillary: 49 mg/dL — ABNORMAL LOW (ref 65–99)
Glucose-Capillary: 53 mg/dL — ABNORMAL LOW (ref 65–99)
Glucose-Capillary: 163 mg/dL — ABNORMAL HIGH (ref 65–99)
Glucose-Capillary: 147 mg/dL — ABNORMAL HIGH (ref 65–99)

## 2017-11-02 LAB — CBC WITH DIFFERENTIAL/PLATELET
Basophils Absolute: 0 10*3/uL (ref 0.0–0.1)
Basophils Relative: 0 %
EOS PCT: 0 %
Eosinophils Absolute: 0 10*3/uL (ref 0.0–0.7)
HEMATOCRIT: 32.2 % — AB (ref 36.0–46.0)
Hemoglobin: 10.1 g/dL — ABNORMAL LOW (ref 12.0–15.0)
LYMPHS ABS: 0.9 10*3/uL (ref 0.7–4.0)
LYMPHS PCT: 5 %
MCH: 27.4 pg (ref 26.0–34.0)
MCHC: 31.4 g/dL (ref 30.0–36.0)
MCV: 87.5 fL (ref 78.0–100.0)
MONO ABS: 0.6 10*3/uL (ref 0.1–1.0)
MONOS PCT: 3 %
Neutro Abs: 16.3 10*3/uL — ABNORMAL HIGH (ref 1.7–7.7)
Neutrophils Relative %: 92 %
PLATELETS: 262 10*3/uL (ref 150–400)
RBC: 3.68 MIL/uL — ABNORMAL LOW (ref 3.87–5.11)
RDW: 18.2 % — AB (ref 11.5–15.5)
WBC: 17.7 10*3/uL — ABNORMAL HIGH (ref 4.0–10.5)

## 2017-11-02 LAB — PROTIME-INR
INR: 2.21
PROTHROMBIN TIME: 24.3 s — AB (ref 11.4–15.2)

## 2017-11-02 LAB — TROPONIN I
TROPONIN I: 0.09 ng/mL — AB (ref <0.03)
TROPONIN I: 0.08 ng/mL — AB (ref <0.03)

## 2017-11-02 LAB — LIPASE, BLOOD: LIPASE: 20 U/L (ref 11–51)

## 2017-11-02 MED ORDER — ADULT MULTIVITAMIN W/MINERALS CH: 1.0000 | ORAL_TABLET | Freq: Every day | ORAL | Status: DC

## 2017-11-02 MED ORDER — ATORVASTATIN CALCIUM 40 MG PO TABS
40.0000 mg | ORAL_TABLET | Freq: Every day | ORAL | Status: DC
  Administered 2017-11-03 – 2017-11-12 (×11): 40 mg via ORAL
  Filled 2017-11-02 (×13): qty 1

## 2017-11-02 MED ORDER — WARFARIN SODIUM 5 MG PO TABS
5.0000 mg | ORAL_TABLET | Freq: Once | ORAL | Status: AC
Start: 2017-11-02 — End: 2017-11-03
  Administered 2017-11-03: 5 mg via ORAL
  Filled 2017-11-02: qty 1

## 2017-11-02 MED ORDER — SODIUM CHLORIDE 0.9 % IV SOLN: Freq: Once | INTRAVENOUS | Status: DC

## 2017-11-02 MED ORDER — HYDROCODONE-ACETAMINOPHEN 5-325 MG PO TABS: 1.0000 | ORAL_TABLET | Freq: Two times a day (BID) | ORAL | Status: DC | PRN

## 2017-11-02 MED ORDER — DEXTROSE 5 % IV SOLN: 1.0000 g | INTRAVENOUS | Status: DC

## 2017-11-02 MED ORDER — VITAMIN D (ERGOCALCIFEROL) 1.25 MG (50000 UNIT) PO CAPS: 50000.0000 [IU] | ORAL_CAPSULE | ORAL | Status: DC

## 2017-11-02 MED ORDER — ONDANSETRON HCL 4 MG PO TABS
4.0000 mg | ORAL_TABLET | Freq: Four times a day (QID) | ORAL | Status: DC | PRN
Start: 2017-11-02 — End: 2017-11-13

## 2017-11-02 MED ORDER — HYDROXYCHLOROQUINE SULFATE 200 MG PO TABS
300.0000 mg | ORAL_TABLET | Freq: Every day | ORAL | Status: DC
  Administered 2017-11-03 – 2017-11-13 (×11): 300 mg via ORAL
  Filled 2017-11-02 (×11): qty 2

## 2017-11-02 MED ORDER — BRIMONIDINE TARTRATE-TIMOLOL 0.2-0.5 % OP SOLN
1.0000 [drp] | Freq: Every day | OPHTHALMIC | Status: DC
  Filled 2017-11-02: qty 5

## 2017-11-02 MED ORDER — AZITHROMYCIN 500 MG IV SOLR
500.0000 mg | Freq: Once | INTRAVENOUS | Status: DC
  Filled 2017-11-02: qty 500

## 2017-11-02 MED ORDER — INSULIN ASPART 100 UNIT/ML ~~LOC~~ SOLN
0.0000 [IU] | SUBCUTANEOUS | Status: DC
  Administered 2017-11-03: 5 [IU] via SUBCUTANEOUS
  Administered 2017-11-03: 2 [IU] via SUBCUTANEOUS
  Administered 2017-11-03: 3 [IU] via SUBCUTANEOUS
  Administered 2017-11-03: 1 [IU] via SUBCUTANEOUS
  Administered 2017-11-03 – 2017-11-04 (×2): 2 [IU] via SUBCUTANEOUS
  Administered 2017-11-04: 5 [IU] via SUBCUTANEOUS
  Administered 2017-11-04: 7 [IU] via SUBCUTANEOUS
  Administered 2017-11-04: 9 [IU] via SUBCUTANEOUS
  Administered 2017-11-04: 7 [IU] via SUBCUTANEOUS
  Administered 2017-11-04: 3 [IU] via SUBCUTANEOUS
  Administered 2017-11-05: 2 [IU] via SUBCUTANEOUS
  Administered 2017-11-05: 3 [IU] via SUBCUTANEOUS
  Administered 2017-11-05: 5 [IU] via SUBCUTANEOUS
  Administered 2017-11-05 (×3): 2 [IU] via SUBCUTANEOUS
  Administered 2017-11-05 – 2017-11-06 (×2): 3 [IU] via SUBCUTANEOUS
  Administered 2017-11-06: 2 [IU] via SUBCUTANEOUS
  Administered 2017-11-06: 7 [IU] via SUBCUTANEOUS
  Administered 2017-11-06: 9 [IU] via SUBCUTANEOUS
  Administered 2017-11-06: 1 [IU] via SUBCUTANEOUS
  Administered 2017-11-07: 3 [IU] via SUBCUTANEOUS
  Administered 2017-11-07: 1 [IU] via SUBCUTANEOUS
  Administered 2017-11-07: 9 [IU] via SUBCUTANEOUS
  Administered 2017-11-07: 2 [IU] via SUBCUTANEOUS
  Administered 2017-11-07: 3 [IU] via SUBCUTANEOUS

## 2017-11-02 MED ORDER — IPRATROPIUM-ALBUTEROL 0.5-2.5 (3) MG/3ML IN SOLN
3.0000 mL | Freq: Four times a day (QID) | RESPIRATORY_TRACT | Status: DC | PRN
  Administered 2017-11-03: 3 mL via RESPIRATORY_TRACT
  Filled 2017-11-02: qty 3

## 2017-11-02 MED ORDER — WARFARIN SODIUM 5 MG PO TABS: 5.0000 mg | ORAL_TABLET | Freq: Once | ORAL | Status: DC

## 2017-11-02 MED ORDER — METOPROLOL TARTRATE 50 MG PO TABS
50.0000 mg | ORAL_TABLET | Freq: Two times a day (BID) | ORAL | Status: DC
  Administered 2017-11-03: 50 mg via ORAL
  Filled 2017-11-02: qty 1

## 2017-11-02 MED ORDER — ACETAMINOPHEN 500 MG PO TABS
1000.0000 mg | ORAL_TABLET | Freq: Four times a day (QID) | ORAL | Status: DC | PRN
  Administered 2017-11-06 – 2017-11-13 (×2): 1000 mg via ORAL
  Filled 2017-11-02 (×3): qty 2

## 2017-11-02 MED ORDER — FEBUXOSTAT 40 MG PO TABS
40.0000 mg | ORAL_TABLET | Freq: Every day | ORAL | Status: DC
Start: 2017-11-03 — End: 2017-11-13
  Administered 2017-11-03 – 2017-11-13 (×11): 40 mg via ORAL
  Filled 2017-11-02 (×11): qty 1

## 2017-11-02 MED ORDER — PREDNISONE 10 MG PO TABS: 5.0000 mg | ORAL_TABLET | Freq: Every day | ORAL | Status: DC

## 2017-11-02 MED ORDER — DEXTROSE 5 % IV SOLN
500.0000 mg | INTRAVENOUS | Status: DC
  Administered 2017-11-03 – 2017-11-04 (×3): 500 mg via INTRAVENOUS
  Filled 2017-11-02 (×4): qty 500

## 2017-11-02 MED ORDER — ALBUTEROL SULFATE (2.5 MG/3ML) 0.083% IN NEBU
2.5000 mg | INHALATION_SOLUTION | Freq: Once | RESPIRATORY_TRACT | Status: AC
Start: 2017-11-02 — End: 2017-11-02
  Administered 2017-11-02: 2.5 mg via RESPIRATORY_TRACT
  Filled 2017-11-02: qty 3

## 2017-11-02 MED ORDER — DEXTROSE 50 % IV SOLN
INTRAVENOUS | Status: AC
  Filled 2017-11-02: qty 50

## 2017-11-02 MED ORDER — SODIUM CHLORIDE 0.9 % IV BOLUS (SEPSIS)
500.0000 mL | Freq: Once | INTRAVENOUS | Status: AC
  Administered 2017-11-02: 500 mL via INTRAVENOUS

## 2017-11-02 MED ORDER — ONDANSETRON HCL 4 MG/2ML IJ SOLN
4.0000 mg | Freq: Once | INTRAMUSCULAR | Status: AC
  Administered 2017-11-02: 4 mg via INTRAVENOUS
  Filled 2017-11-02: qty 2

## 2017-11-02 MED ORDER — DEXTROSE 50 % IV SOLN
25.0000 mL | Freq: Once | INTRAVENOUS | Status: AC
  Administered 2017-11-02: 25 mL via INTRAVENOUS

## 2017-11-02 MED ORDER — AMLODIPINE BESYLATE 5 MG PO TABS
10.0000 mg | ORAL_TABLET | Freq: Every day | ORAL | Status: DC
  Filled 2017-11-02: qty 2

## 2017-11-02 MED ORDER — ONDANSETRON HCL 4 MG/2ML IJ SOLN
4.0000 mg | Freq: Four times a day (QID) | INTRAMUSCULAR | Status: DC | PRN
  Administered 2017-11-03 (×2): 4 mg via INTRAVENOUS
  Filled 2017-11-02 (×2): qty 2

## 2017-11-02 MED ORDER — LATANOPROST 0.005 % OP SOLN
1.0000 [drp] | Freq: Every day | OPHTHALMIC | Status: DC
  Administered 2017-11-03 – 2017-11-12 (×11): 1 [drp] via OPHTHALMIC
  Filled 2017-11-02 (×3): qty 2.5

## 2017-11-02 MED ORDER — DICYCLOMINE HCL 10 MG PO CAPS: 10.0000 mg | ORAL_CAPSULE | Freq: Three times a day (TID) | ORAL | Status: DC

## 2017-11-02 MED ORDER — GLUCOSE 40 % PO GEL
ORAL | Status: AC
  Filled 2017-11-02: qty 1

## 2017-11-02 MED ORDER — CALCITRIOL 0.25 MCG PO CAPS
0.2500 ug | ORAL_CAPSULE | ORAL | Status: DC
Start: 2017-11-04 — End: 2017-11-13
  Administered 2017-11-04 – 2017-11-13 (×5): 0.25 ug via ORAL
  Filled 2017-11-02 (×5): qty 1

## 2017-11-02 MED ORDER — KETOROLAC TROMETHAMINE 0.5 % OP SOLN
1.0000 [drp] | Freq: Every day | OPHTHALMIC | Status: DC
  Administered 2017-11-04 – 2017-11-13 (×10): 1 [drp] via OPHTHALMIC
  Filled 2017-11-02: qty 3

## 2017-11-02 MED ORDER — PANTOPRAZOLE SODIUM 40 MG IV SOLR
40.0000 mg | Freq: Once | INTRAVENOUS | Status: AC
  Administered 2017-11-02: 40 mg via INTRAVENOUS
  Filled 2017-11-02: qty 40

## 2017-11-02 MED ORDER — DEXTROSE 5 % IV SOLN
2.0000 g | Freq: Once | INTRAVENOUS | Status: AC
  Administered 2017-11-02: 2 g via INTRAVENOUS
  Filled 2017-11-02: qty 2

## 2017-11-02 MED ORDER — SODIUM CHLORIDE 0.9 % IV SOLN: INTRAVENOUS | Status: DC

## 2017-11-02 MED ORDER — WARFARIN - PHARMACIST DOSING INPATIENT
Status: DC
  Administered 2017-11-06 – 2017-11-13 (×5)

## 2017-11-02 MED ORDER — FOLIC ACID 1 MG PO TABS: 1.0000 mg | ORAL_TABLET | Freq: Every day | ORAL | Status: DC

## 2017-11-02 NOTE — ED Notes (Signed)
Critical Result. Trop 0.09. MD Jeneen Rinks notified.

## 2017-11-02 NOTE — ED Triage Notes (Signed)
Pt from Dr. Delanna Ahmadi office, sent for emesis X3 days and intermittent fever. Pt is being tx for wound on R leg. Pt is DM. CBG 49 in triage.

## 2017-11-02 NOTE — Progress Notes (Signed)
Pharmacy Antibiotic Note  [name] is a 72 y.o. female admitted on 11/02/2017 with pneumonia.  Pharmacy has been consulted for Rocephin dosing.  Plan: Rocephin 1gm IV every 24 hours. Dose stable for age, weight, renal function and indication. No pharmacokinetic monitoring needed. Sign off.  Height: 5\' 4"  (162.6 cm) Weight: 188 lb (85.3 kg) IBW/kg (Calculated) : 54.7  Temp (24hrs), Avg:98.8 F (37.1 C), Min:98.6 F (37 C), Max:99.1 F (37.3 C)  Recent Labs  Lab 11/02/17 1536  WBC 17.7*  CREATININE 4.00*    Estimated Creatinine Clearance: 13.6 mL/min (A) (by C-G formula based on SCr of 4 mg/dL (H)).    Allergies  Allergen Reactions  . Ace Inhibitors Cough   Antimicrobials this admission: Rocephin 2/4 >>  Azith 2/4 >>   Dose adjustments this admission: n/a   Microbiology results: 2/4 BCx: pending  UCx:    Sputum:    MRSA PCR:   Thank you for allowing pharmacy to be a part of this patient's care.  Pricilla Larsson 11/02/2017 10:32 PM

## 2017-11-02 NOTE — ED Notes (Signed)
Patient requesting breathing treatment. Spoke to Dr. Jeneen Rinks. New verbal orders obtained.

## 2017-11-02 NOTE — Progress Notes (Signed)
ANTICOAGULATION CONSULT NOTE - Initial Consult  Pharmacy Consult for Warfarin (home med) Indication: atrial fibrillation  Allergies  Allergen Reactions  . Ace Inhibitors Cough   Patient Measurements: Height: 5\' 4"  (162.6 cm) Weight: 188 lb (85.3 kg) IBW/kg (Calculated) : 54.7 Heparin Dosing Weight:   Vital Signs: Temp: 98.6 F (37 C) (02/04 1934) Temp Source: Oral (02/04 1934) BP: 175/69 (02/04 1930) Pulse Rate: 97 (02/04 1930)  Labs: Recent Labs    11/02/17 1536 11/02/17 1537 11/02/17 2011  HGB 10.1*  --   --   HCT 32.2*  --   --   PLT 262  --   --   LABPROT  --  24.3*  --   INR  --  2.21  --   CREATININE 4.00*  --   --   TROPONINI 0.09*  --  0.08*    Estimated Creatinine Clearance: 13.6 mL/min (A) (by C-G formula based on SCr of 4 mg/dL (H)).   Medical History: Past Medical History:  Diagnosis Date  . Anemia   . Arthritis   . Asthma   . Atrial fibrillation ([place])   . Atrial flutter [facility])    TEE/DCCV December 2013 - on Xarelto [facility])  . Bone spur of toe    Right 5th toe  . Chronic diastolic CHF (congestive heart failure) (HCC)    a. EF 50-55% by echo in 2015  . CKD (chronic kidney disease) stage 4, GFR 15-29 ml/min (HCC)    a. s/p fistula placement but not on HD. Followed by Nephrology.   Marland Kitchen COPD (chronic obstructive pulmonary disease) ([place])   . DVT of upper extremity (deep vein thrombosis) ([place])    Right basilic vein, June 8832  . Essential hypertension, benign   . Fractures   . SLE (systemic lupus erythematosus) ([place])   . Type 2 diabetes mellitus ([place])   . UTI (lower urinary tract infection)     Medications:   (Not in a hospital admission)  Home dose = 5mg  daily per PTA med list.  Assessment: Okay for Protocol, INR @ goal.  Last dose 2/3 per home med list.  Goal of Therapy:  INR 2-3   Plan:  Warfarin 5mg  PO x 1  Daily PT/INR  Pricilla Larsson 11/02/2017,10:50 PM

## 2017-11-02 NOTE — H&P (Addendum)
History and Physical    [name] KGM:[id number] DOB: 1946-04-04 DOA: 11/02/2017  PCP: Sharilyn Sites, MD   Patient coming from: Home  Chief Complaint: N/V along with fever and cough  HPI: [name] is a 72 y.o. female with medical history significant for CKD stage IV with fistula placement to left arm, chronic diastolic heart failure, iron deficiency anemia, atrial fibrillation on anticoagulation with warfarin, COPD, hypertension, type 2 diabetes, and SLE who presents to the emergency department after being told by her primary care provider that she needs to be further evaluated for worsening acute on chronic abdominal pain with nausea and vomiting over the last 2-3 days along with cough and fever that began approximately at the same time.  She states that she has had very poor oral intake on account of her nausea and vomiting and has also had some mild diarrhea.  She denies any blood in her emesis or stools and states that the abdominal pain is the same as her usual pain that has been ongoing now for the last 6 months.  She has been referred to gastroenterology with an appointment set on the 14th and has had prior CT scans which have been unrevealing.  She also has chronic wounds to her right leg which is being managed with wound care at home.  She denies any chest pain, shortness of breath, or other symptomatology and states that her nausea is somewhat feeling better and she would like something to eat.   ED Course: Vital signs are noted to be stable and patient is in sinus tachycardia with heart rate 100-110 bpm.  Laboratory data reveals leukocytosis of 17,700, stable anemia with hemoglobin 10.1, and worsening renal function with BUN 90, and creatinine 4 with baseline of approximately 2.2 previously.  Blood glucose was noted to be 46 mg/dL for which she was given some IV dextrose.  Troponin is 0.09 and EKG demonstrates sinus tachycardia with a rate of 100 and no ischemic changes  identified.  Chest x-ray demonstrates some cardiomegaly with mild interstitial edema and CT abdomen with pelvis has no acute findings aside from some left lower lobe lung infiltrate suggestive of pneumonia.  Right tibial x-ray with no acute findings.  She has been started on Rocephin and azithromycin for treatment of pneumonia after IV fluid bolus and is currently on IV fluid.  Review of Systems: As per HPI otherwise 10 point review of systems negative.   Past Medical History:  Diagnosis Date  . Anemia   . Arthritis   . Asthma   . Atrial fibrillation ([place])   . Atrial flutter [facility])    TEE/DCCV December 2013 - on Xarelto [facility])  . Bone spur of toe    Right 5th toe  . Chronic diastolic CHF (congestive heart failure) (HCC)    a. EF 50-55% by echo in 2015  . CKD (chronic kidney disease) stage 4, GFR 15-29 ml/min (HCC)    a. s/p fistula placement but not on HD. Followed by Nephrology.   Marland Kitchen COPD (chronic obstructive pulmonary disease) ([place])   . DVT of upper extremity (deep vein thrombosis) ([place])    Right basilic vein, June 6440  . Essential hypertension, benign   . Fractures   . SLE (systemic lupus erythematosus) ([place])   . Type 2 diabetes mellitus ([place])   . UTI (lower urinary tract infection)     Past Surgical History:  Procedure Laterality Date  . ABDOMINAL HYSTERECTOMY  1983  . ANKLE SURGERY  1993  .  AV FISTULA PLACEMENT Left 03/27/2014   Procedure: ARTERIOVENOUS (AV) FISTULA CREATION;  Surgeon: Rosetta Posner, MD;  Location: [place];  Service: Vascular;  Laterality: Left;  . BACK SURGERY  1980  . CARDIOVASCULAR STRESS TEST  12/19/2009   no stress induced rhythm abnormalities, ekg negative for ischemia  . CARDIOVERSION  09/16/2012   Procedure: CARDIOVERSION;  Surgeon: Sanda Klein, MD;  Location[facility] ENDOSCOPY;  Service: Cardiovascular;  Laterality: N/A;  . COLONOSCOPY  09/20/2012   Dr. [name]: multiple tubular adenomas   . COLONOSCOPY  2005   Dr. [name]. Polyps, path unknown   .  COLONOSCOPY N/A 07/24/2016   Procedure: COLONOSCOPY;  Surgeon: [name], MD;  [facility];  Service: Endoscopy;  Laterality: N/A;  9:00 am  . ESOPHAGOGASTRODUODENOSCOPY  09/20/2012   Dr. [name]: duodenal erosion and possible resolving ulcer at the angularis   . TEE WITHOUT CARDIOVERSION  09/16/2012   Procedure: TRANSESOPHAGEAL ECHOCARDIOGRAM (TEE);  Surgeon: Sanda Klein, MD;  Location[facility] ENDOSCOPY;  Service: Cardiovascular;  Laterality: N/A;  . TRANSESOPHAGEAL ECHOCARDIOGRAM WITH CARDIOVERSION  09/16/2012   EF 60-65%, moderate LVH, moderate regurg of the aortic valve, LA moderately dilated     reports that she quit smoking about 5 years ago. Her smoking use included cigarettes. She has a 30.00 pack-year smoking history. she has never used smokeless tobacco. She reports that she does not drink alcohol or use drugs.  Allergies  Allergen Reactions  . Ace Inhibitors Cough    Family History  Problem Relation Age of Onset  . Diabetes Brother   . Diabetes Father   . Hypertension Father   . Hypertension Sister   . Stroke Mother   . Diabetes Sister   . Hypertension Brother   . Colon cancer Neg Hx     Prior to Admission medications   Medication Sig Start Date End Date Taking? Authorizing Provider  acetaminophen (TYLENOL) 500 MG tablet Take 1,000 mg by mouth every 6 (six) hours as needed. For pain   Yes [provider]  albuterol (ACCUNEB) 0.63 MG/3ML nebulizer solution Take 1 ampule by nebulization every 6 (six) hours as needed for wheezing or shortness of breath.    Yes [provider]  albuterol (PROVENTIL HFA;VENTOLIN HFA) 108 (90 BASE) MCG/ACT inhaler Inhale 2 puffs into the lungs every 6 (six) hours as needed. For shortness of breath   Yes [provider]  amLODipine (NORVASC) 10 MG tablet Take 1 tablet (10 mg total) by mouth at bedtime. 03/28/14  Yes [name], MD  atorvastatin (LIPITOR) 40 MG tablet Take 40 mg at bedtime by  mouth.    Yes [provider]  brimonidine-timolol (COMBIGAN) 0.2-0.5 % ophthalmic solution Place 1 drop into the left eye daily.    Yes [provider]  calcitRIOL (ROCALTROL) 0.25 MCG capsule Take 0.25 mcg by mouth as directed. Take on Mondays Wednesdays and Fridays   Yes [provider]  diclofenac (VOLTAREN) 0.1 % ophthalmic solution Place 1 drop into the left eye daily. Uses at lunch time   Yes [provider]  dicyclomine (BENTYL) 10 MG capsule Take 10 mg by mouth 4 (four) times daily -  before meals and at bedtime.   Yes [provider]  febuxostat (ULORIC) 40 MG tablet Take 40 mg by mouth daily.   Yes [provider]  folic acid (FOLVITE) 1 MG tablet Take 1 mg by mouth daily.   Yes [provider]  furosemide (LASIX) 80 MG tablet  Take 160 mg by mouth 2 (two) times daily.    Yes [provider]  HUMALOG KWIKPEN 100 UNIT/ML KiwkPen INJECT UP TO 11 UNITS SUBCUTANEOUSLY BEFORE MEALS THREE TIMES DAILY. 07/24/17  Yes Cassandria Anger, MD  HYDROcodone-acetaminophen (NORCO/VICODIN) 5-325 MG tablet 1 or 2 tabs PO q12 hours prn pain 08/31/17  Yes Francine Graven, DO  hydroxychloroquine (PLAQUENIL) 200 MG tablet Take 200 mg by mouth as directed. Take 1 1/2 tablets daily   Yes [provider]  Insulin Detemir (LEVEMIR FLEXTOUCH) 100 UNIT/ML Pen Inject 10 Units into the skin daily at 10 pm. 10/27/17  Yes [name], [name], MD  metoprolol tartrate (LOPRESSOR) 50 MG tablet TAKE ONE TABLET BY MOUTH TWICE DAILY. 10/23/17  Yes Strader, [place], PA-C  Multiple Vitamins-Minerals (CENTRUM SILVER PO) Take 1 tablet by mouth daily.   Yes [provider]  ONE TOUCH ULTRA TEST test strip TEST 4 TIMES A DAY AS DIRECTED. 10/28/17  Yes [name], [name], MD  ONETOUCH DELICA LANCETS 82X MISC USE TO TEST 4 TIMES DAILY. 09/05/15  Yes [name], [name], MD  predniSONE (DELTASONE) 5 MG tablet Take 5 mg by mouth daily  with breakfast.   Yes [provider]  travoprost, benzalkonium, (TRAVATAN) 0.004 % ophthalmic solution Place 1 drop into the left eye at bedtime.   Yes [provider]  UNIFINE PENTIPS 31G X 6 MM MISC USE AS DIRECTED AT BEDTIME WITH LEVEMIR AND WITH SLIDING SCALE INSULIN. 10/16/17  Yes [name], [name], MD  Vitamin D, Ergocalciferol, (DRISDOL) 50000 units CAPS capsule TAKE 1 CAPSULE BY MOUTH ONCE WEEKLY. 09/08/17  Yes Cassandria Anger, MD  warfarin (COUMADIN) 5 MG tablet Take 1 tablet daily Patient taking differently: Take 5 mg one time only at 6 PM by mouth. Take 1 tablet daily 01/26/17  Yes Satira Sark, MD    Physical Exam: Vitals:   11/02/17 1728 11/02/17 1730 11/02/17 1930 11/02/17 1934  BP:  (!) 172/63 (!) 175/69   Pulse:  92 97   Resp:   18   Temp:    98.6 F (37 C)  TempSrc:    Oral  SpO2: 96% 100% 92% 92%  Weight:      Height:        Constitutional: NAD, calm, comfortable Vitals:   11/02/17 1728 11/02/17 1730 11/02/17 1930 11/02/17 1934  BP:  (!) 172/63 (!) 175/69   Pulse:  92 97   Resp:   18   Temp:    98.6 F (37 C)  TempSrc:    Oral  SpO2: 96% 100% 92% 92%  Weight:      Height:       Eyes: lids and conjunctivae normal ENMT: Mucous membranes are moist.  Neck: normal, supple Respiratory: clear to auscultation bilaterally. Normal respiratory effort. No accessory muscle use. Currently on RA. Cardiovascular: Regular rate and rhythm, no murmurs. Mild 1+ pitting edema to LE bilaterally. Abdomen: no tenderness, no distention. Bowel sounds positive.  Musculoskeletal:  No joint deformity upper and lower extremities.   Skin: no rashes, lesions, ulcers.  Psychiatric: Normal judgment and insight. Alert and oriented x 3. Normal mood.   Labs on Admission: I have personally reviewed following labs and imaging studies  CBC: Recent Labs  Lab 11/02/17 1536  WBC 17.7*  NEUTROABS 16.3*  HGB 10.1*  HCT 32.2*  MCV 87.5  PLT 937   Basic  Metabolic Panel: Recent Labs  Lab 11/02/17 1536  NA 136  K 3.9  CL 105  CO2 18*  GLUCOSE 70  BUN 90*  CREATININE 4.00*  CALCIUM 8.8*   GFR: Estimated Creatinine Clearance: 13.6 mL/min (A) (by C-G formula based on SCr of 4 mg/dL (H)). Liver Function Tests: Recent Labs  Lab 11/02/17 1536  AST 37  ALT 27  ALKPHOS 108  BILITOT 0.6  PROT 7.4  ALBUMIN 2.3*   Recent Labs  Lab 11/02/17 1536  LIPASE 20   No results for input(s): AMMONIA in the last 168 hours. Coagulation Profile: No results for input(s): INR, PROTIME in the last 168 hours. Cardiac Enzymes: Recent Labs  Lab 11/02/17 1536  TROPONINI 0.09*   BNP (last 3 results) No results for input(s): PROBNP in the last 8760 hours. HbA1C: No results for input(s): HGBA1C in the last 72 hours. CBG: Recent Labs  Lab 11/02/17 1426 11/02/17 1433 11/02/17 1650 11/02/17 1843  GLUCAP 49* 53* 190* 163*   Lipid Profile: No results for input(s): CHOL, HDL, LDLCALC, TRIG, CHOLHDL, LDLDIRECT in the last 72 hours. Thyroid Function Tests: No results for input(s): TSH, T4TOTAL, FREET4, T3FREE, THYROIDAB in the last 72 hours. Anemia Panel: No results for input(s): VITAMINB12, FOLATE, FERRITIN, TIBC, IRON, RETICCTPCT in the last 72 hours. Urine analysis:    Component Value Date/Time   COLORURINE YELLOW 08/13/2017 [place] 08/13/2017 1006   LABSPEC 1.012 08/13/2017 1006   PHURINE 5.0 08/13/2017 1006   GLUCOSEU NEGATIVE 08/13/2017 1006   HGBUR NEGATIVE 08/13/2017 1006   BILIRUBINUR NEGATIVE 08/13/2017 [place] 08/13/2017 1006   PROTEINUR 30 (A) 08/13/2017 1006   UROBILINOGEN 1.0 03/17/2014 1830   NITRITE NEGATIVE 08/13/2017 1006   LEUKOCYTESUR LARGE (A) 08/13/2017 1006    Radiological Exams on Admission: Ct Abdomen Pelvis Wo Contrast  Result Date: 11/02/2017 CLINICAL DATA:  Emesis for the last 3 days.  Intermittent fever. EXAM: CT ABDOMEN AND PELVIS WITHOUT CONTRAST TECHNIQUE:  Multidetector CT imaging of the abdomen and pelvis was performed following the standard protocol without IV contrast. COMPARISON:  09/09/2017.  08/31/2017. FINDINGS: Lower chest: There is a small left effusion. There is segmental consolidation in the left lower lobe suggesting pneumonia. The heart is mildly enlarged. Hepatobiliary: No liver parenchymal abnormality is seen. There is some layering sludge or stones in the gallbladder. No CT evidence of cholecystitis. Pancreas: Normal Spleen: Normal Adrenals/Urinary Tract: Adrenal glands are normal. Kidneys show vascular calcifications but no acute finding. Bladder appears normal. Stomach/Bowel: No acute bowel pathology is seen. No sign of obstruction or inflammatory disease. Vascular/Lymphatic: Advanced aortic atherosclerosis. Atherosclerosis of the aortic branch vessels. IVC is normal. No retroperitoneal adenopathy. Reproductive: Previous hysterectomy.  No pelvic mass. Other: No free fluid or air. Musculoskeletal: Chronic lumbar degenerative changes. No acute bone finding. IMPRESSION: Segmental consolidation in the left lower lobe consistent with bronchopneumonia. Small amount of left pleural fluid. Sludge or gallstones in the gallbladder. Aortic atherosclerosis. No acute bowel pathology seen. No obstruction or inflammatory finding. Electronically Signed   By: Nelson Chimes M.D.   On: 11/02/2017 18:28   Dg Tibia/fibula Right  Result Date: 11/02/2017 CLINICAL DATA:  Right lower leg ulcers. EXAM: RIGHT TIBIA AND FIBULA - 2 VIEW COMPARISON:  None. FINDINGS: There is no evidence of fracture or other focal bone lesions. Surgical screw is seen in distal right fibula. Vascular calcifications are noted. No lytic destruction is seen to suggest osteomyelitis. IMPRESSION: No fracture or dislocation is noted. No definite evidence of osteomyelitis. Electronically Signed   By: Marijo Conception, M.D.  On: 11/02/2017 17:30   Dg Chest Port 1 View  Result Date: 11/02/2017 CLINICAL  DATA:  Shortness of breath and cough. EXAM: PORTABLE CHEST 1 VIEW COMPARISON:  August 13, 2017 FINDINGS: Stable cardiomegaly. Mild edema. No other interval changes or acute abnormalities. IMPRESSION: Cardiomegaly and mild edema. Electronically Signed   By: Dorise Bullion III M.D   On: 11/02/2017 19:11    EKG: Independently reviewed. Sinus tach; HR 100bpm.  Assessment/Plan Principal Problem:   Renal failure (ARF), acute on chronic (HCC) Active Problems:   Atrial flutter (HCC)   SLE (systemic lupus erythematosus) (HCC)   Essential hypertension, benign   COPD (chronic obstructive pulmonary disease) (HCC)   Chronic diastolic heart failure (HCC)   Mixed hyperlipidemia   Type 2 diabetes mellitus with stage 4 chronic kidney disease (HCC)   Iron deficiency anemia   Community acquired pneumonia   Nausea and vomiting    1. AK I on CKD stage IV-likely prerenal.  Continue on very gentle IV fluid at 60 cc/h for up to another 12 hours, then stop.  She appears to be euvolemic if not borderline hypervolemic.  Hold home Lasix as well as other nephrotoxic agents.  CT of the abdomen and pelvis with no significant signs of hydronephrosis or nephrolithiasis noted at this time and therefore will defer on renal ultrasound.  Daily weights with strict I's and O's on pure wick.  Consult nephrology for further assistance in management given some increased volume as well as metabolic acidosis and uremia and patient nearing hemodialysis. 2. Left lower lobe pneumonia.  Continue with azithromycin and Rocephin.  Sputum culture and sensitivities, respiratory panel, urine Legionella, urine strep pneumo.  Symptomatic medications.  Duo nebs as needed especially given COPD history. 3. Intractable nausea and vomiting with acute on chronic abdominal pain.  This is likely related to the above processes with metabolic disturbances, but CT abdomen demonstrates some cholelithiasis with sludge for which I will order a right upper  quadrant abdominal ultrasound for further evaluation.  May need GI consultation if this continues to worsen.  Zofran as needed for nausea and vomiting and start with clear liquid diet for now and advance as tolerated.  Continue Bentyl. 4. Elevated troponin.  She is currently asymptomatic and this is likely related to her kidney disease, but we will continue to trend. 5. Chronic diastolic congestive heart failure.  She does not appear to have an acute component at this time despite some mild volume overload.  2D echo on 02/2014 demonstrated grade 4 diastolic dysfunction with EF 50-55%.  We will continue gentle IV fluid for now given renal function and avoid Lasix. 6. Atrial flutter/fibrillation-currently in sinus tachycardia.  Continue on home warfarin and check INR level with pharmacy to dose.  No active signs of bleeding currently noted.  Continue on metoprolol. 7. Type 2 diabetes with hypoglycemia.  This is related to poor oral intake.  Will maintain on sliding scale insulin that is renally dosed while here and carefully monitor every 4 hours. Hold levemir and meal time insulin at this time. 8. Hypertension.  Continue home medications that are not nephrotoxic and monitor. 9. Anemia.  Likely multifactorial with chronic kidney disease as well as some iron deficiency.  This appears to be stable, and therefore fecal occult does not seem necessary right now.  Continue to monitor. 10. Right lower extremity wounds.  These do not appear to be infected at this time.  Follow with wound care. 11. SLE.  Continue on Plaquenil and  prednisone.   DVT prophylaxis: On Warfarin Code Status: Full Family Communication: None at bedside Disposition Plan:Home when stable Consults called:Nephrology Admission status: Inpatient, med-surg   Deklen Popelka Darleen Crocker DO [facility] Pager [phone]  If 7PM-7AM, please contact night-coverage [email or web address] Password [facility]  11/02/2017, 9:37 PM

## 2017-11-02 NOTE — ED Notes (Signed)
Patient to radiology.

## 2017-11-02 NOTE — ED Notes (Signed)
Pt notified need for urine. Will call staff when able to give sample.

## 2017-11-02 NOTE — ED Provider Notes (Signed)
[facility] EMERGENCY DEPARTMENT Provider Note   CSN: [id number] Arrival date & time: 11/02/17  1400     History   Chief Complaint Chief Complaint  Patient presents with  . Emesis    HPI [name] is a 72 y.o. female.  Complaint is diabetic, vomiting, abdominal pain, wound on the leg.  HPI: 72 year old female.  She and her husband report abdominal pain for 6 months "nobody can figure it out".  Apparently was at Dr. Delanna Ahmadi office today.  Was sent here according to the husband "to be admitted to see a GI doctor, and to get an infusion of antibiotics to heal that wound on her leg".  Report abdominal pain over several months.  Has been referred to GI.  She has an appointment on the 14th.  Is had 3 outpatient CT scans that have been unrevealing.  Is been vomiting for the last 4 days although none since late last night.  Continues to have abdominal pain.  She is nauseated now.  Presents with a blood sugar of 46.  He has had a cough for the last 2 days.  Subjectively feels as though she has had fever.  No urinary symptoms.  Also has a wound on her right leg.  Has been seen by physical therapy at wound care center is caring for the wound at home.  Is also been seen by vascular surgery.  Is not thought to require interventions for wound healing.  Is being seen every 6 weeks  Past Medical History:  Diagnosis Date  . Anemia   . Arthritis   . Asthma   . Atrial fibrillation ([place])   . Atrial flutter [facility])    TEE/DCCV December 2013 - on Xarelto [facility])  . Bone spur of toe    Right 5th toe  . Chronic diastolic CHF (congestive heart failure) (HCC)    a. EF 50-55% by echo in 2015  . CKD (chronic kidney disease) stage 4, GFR 15-29 ml/min (HCC)    a. s/p fistula placement but not on HD. Followed by Nephrology.   Marland Kitchen COPD (chronic obstructive pulmonary disease) ([place])   . DVT of upper extremity (deep vein thrombosis) ([place])    Right basilic vein, June 3212  . Essential hypertension,  benign   . Fractures   . SLE (systemic lupus erythematosus) ([place])   . Type 2 diabetes mellitus ([place])   . UTI (lower urinary tract infection)     Patient Active Problem List   Diagnosis Date Noted  . Iron deficiency anemia 08/07/2017  . Heme positive stool 10/04/2015  . Mixed hyperlipidemia 06/29/2015  . Type 2 diabetes mellitus with stage 4 chronic kidney disease ([place]) 06/29/2015  . Encounter for adequacy testing for hemodialysis ([place]) 04/12/2014  . Chronic kidney disease, stage V ([place]) 04/12/2014  . 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 Encounter for therapeutic drug monitoring 03/30/2014  . Chronic diastolic heart failure ([place]) 03/15/2014  . Renal failure (ARF), acute on chronic (HCC) 03/13/2014  . Anemia of chronic renal failure, stage 4 (severe) ([place]) 02/23/2014  . Folate deficiency 02/17/2014  . Aortic regurgitation 09/19/2012  . Atrial flutter ([place]) 09/14/2012  . Class 2 severe obesity due to excess calories with serious comorbidity and body mass index (BMI) of 35.0 to 35.9 in adult ([place]) 09/14/2012  . Type 2 diabetes mellitus with diabetic neuropathy ([place]) 09/14/2012  . SLE (systemic lupus erythematosus) ([place]) 09/14/2012  . Essential hypertension, benign 09/14/2012  . COPD (chronic  obstructive pulmonary disease) ([place]) 09/14/2012    Past Surgical History:  Procedure Laterality Date  . ABDOMINAL HYSTERECTOMY  1983  . ANKLE SURGERY  1993  . AV FISTULA PLACEMENT Left 03/27/2014   Procedure: ARTERIOVENOUS (AV) FISTULA CREATION;  Surgeon: Rosetta Posner, MD;  Location: [place];  Service: Vascular;  Laterality: Left;  . BACK SURGERY  1980  . CARDIOVASCULAR STRESS TEST  12/19/2009   no stress induced rhythm abnormalities, ekg negative for ischemia  . CARDIOVERSION  09/16/2012   Procedure: CARDIOVERSION;  Surgeon: Sanda Klein, MD;  Location: [facility] ENDOSCOPY;  Service: Cardiovascular;  Laterality: N/A;  . COLONOSCOPY   09/20/2012   Dr. [name]: multiple tubular adenomas   . COLONOSCOPY  2005   Dr. [name]. Polyps, path unknown   . COLONOSCOPY N/A 07/24/2016   Procedure: COLONOSCOPY;  Surgeon: [name], MD;  [facility];  Service: Endoscopy;  Laterality: N/A;  9:00 am  . ESOPHAGOGASTRODUODENOSCOPY  09/20/2012   Dr. [name]: duodenal erosion and possible resolving ulcer at the angularis   . TEE WITHOUT CARDIOVERSION  09/16/2012   Procedure: TRANSESOPHAGEAL ECHOCARDIOGRAM (TEE);  Surgeon: Sanda Klein, MD;  Location: [facility] ENDOSCOPY;  Service: Cardiovascular;  Laterality: N/A;  . TRANSESOPHAGEAL ECHOCARDIOGRAM WITH CARDIOVERSION  09/16/2012   EF 60-65%, moderate LVH, moderate regurg of the aortic valve, LA moderately dilated    OB History    No data available       Home Medications    Prior to Admission medications   Medication Sig Start Date End Date Taking? Authorizing Provider  acetaminophen (TYLENOL) 500 MG tablet Take 1,000 mg by mouth every 6 (six) hours as needed. For pain   Yes [provider]  albuterol (ACCUNEB) 0.63 MG/3ML nebulizer solution Take 1 ampule by nebulization every 6 (six) hours as needed for wheezing or shortness of breath.    Yes [provider]  albuterol (PROVENTIL HFA;VENTOLIN HFA) 108 (90 BASE) MCG/ACT inhaler Inhale 2 puffs into the lungs every 6 (six) hours as needed. For shortness of breath   Yes [provider]  amLODipine (NORVASC) 10 MG tablet Take 1 tablet (10 mg total) by mouth at bedtime. 03/28/14  Yes [name], MD  atorvastatin (LIPITOR) 40 MG tablet Take 40 mg at bedtime by mouth.    Yes [provider]  brimonidine-timolol (COMBIGAN) 0.2-0.5 % ophthalmic solution Place 1 drop into the left eye daily.    Yes [provider]  calcitRIOL (ROCALTROL) 0.25 MCG capsule Take 0.25 mcg by mouth as directed. Take on Mondays Wednesdays and Fridays   Yes [provider]  diclofenac (VOLTAREN) 0.1  % ophthalmic solution Place 1 drop into the left eye daily. Uses at lunch time   Yes [provider]  dicyclomine (BENTYL) 10 MG capsule Take 10 mg by mouth 4 (four) times daily -  before meals and at bedtime.   Yes [provider]  febuxostat (ULORIC) 40 MG tablet Take 40 mg by mouth daily.   Yes [provider]  folic acid (FOLVITE) 1 MG tablet Take 1 mg by mouth daily.   Yes [provider]  furosemide (LASIX) 80 MG tablet Take 160 mg by mouth 2 (two) times daily.    Yes [provider]  HUMALOG KWIKPEN 100 UNIT/ML KiwkPen INJECT UP TO 11 UNITS SUBCUTANEOUSLY BEFORE MEALS THREE TIMES DAILY. 07/24/17  Yes Cassandria Anger, MD  HYDROcodone-acetaminophen (NORCO/VICODIN) 5-325 MG tablet 1 or 2 tabs PO q12 hours prn pain 08/31/17  Yes Francine Graven, DO  hydroxychloroquine (PLAQUENIL) 200 MG tablet Take 200 mg by mouth as directed. Take 1 1/2 tablets daily   Yes [provider]  Insulin Detemir (LEVEMIR FLEXTOUCH) 100 UNIT/ML Pen Inject 10 Units into the skin daily at 10 pm. 10/27/17  Yes [name], [name], MD  metoprolol tartrate (LOPRESSOR) 50 MG tablet TAKE ONE TABLET BY MOUTH TWICE DAILY. 10/23/17  Yes Strader, [place], PA-C  Multiple Vitamins-Minerals (CENTRUM SILVER PO) Take 1 tablet by mouth daily.   Yes [provider]  ONE TOUCH ULTRA TEST test strip TEST 4 TIMES A DAY AS DIRECTED. 10/28/17  Yes [name], [name], MD  ONETOUCH DELICA LANCETS 16X MISC USE TO TEST 4 TIMES DAILY. 09/05/15  Yes [name], [name], MD  predniSONE (DELTASONE) 5 MG tablet Take 5 mg by mouth daily with breakfast.   Yes [provider]  travoprost, benzalkonium, (TRAVATAN) 0.004 % ophthalmic solution Place 1 drop into the left eye at bedtime.   Yes [provider]  UNIFINE PENTIPS 31G X 6 MM MISC USE AS DIRECTED AT BEDTIME WITH LEVEMIR AND WITH SLIDING SCALE INSULIN. 10/16/17  Yes [name], [name], MD  Vitamin D,  Ergocalciferol, (DRISDOL) 50000 units CAPS capsule TAKE 1 CAPSULE BY MOUTH ONCE WEEKLY. 09/08/17  Yes Cassandria Anger, MD  warfarin (COUMADIN) 5 MG tablet Take 1 tablet daily Patient taking differently: Take 5 mg one time only at 6 PM by mouth. Take 1 tablet daily 01/26/17  Yes Satira Sark, MD    Family History Family History  Problem Relation Age of Onset  . Diabetes Brother   . Diabetes Father   . Hypertension Father   . Hypertension Sister   . Stroke Mother   . Diabetes Sister   . Hypertension Brother   . Colon cancer Neg Hx     Social History Social History   Tobacco Use  . Smoking status: Former Smoker    Packs/day: 1.00    Years: 30.00    Pack years: 30.00    Types: Cigarettes    Last attempt to quit: 08/29/2012    Years since quitting: 5.1  . Smokeless tobacco: Never Used  . Tobacco comment: quit 08-2012  Substance Use Topics  . Alcohol use: No    Alcohol/week: 0.0 oz  . Drug use: No     Allergies   Ace inhibitors   Review of Systems Review of Systems  Constitutional: Positive for appetite change, chills, fatigue and fever. Negative for diaphoresis.  HENT: Negative for mouth sores, sore throat and trouble swallowing.   Eyes: Negative for visual disturbance.  Respiratory: Positive for cough and shortness of breath. Negative for chest tightness and wheezing.   Cardiovascular: Negative for chest pain.  Gastrointestinal: Positive for abdominal pain, nausea and vomiting. Negative for abdominal distention and diarrhea.  Endocrine: Negative for polydipsia, polyphagia and polyuria.  Genitourinary: Negative for dysuria, frequency and hematuria.  Musculoskeletal: Negative for gait problem.  Skin: Positive for wound. Negative for color change, pallor and rash.  Neurological: Positive for weakness. Negative for dizziness, syncope, light-headedness and headaches.  Hematological: Does not bruise/bleed easily.  Psychiatric/Behavioral: Negative for behavioral  problems and confusion.     Physical Exam Updated Vital Signs BP (!) 175/69   Pulse 97   Temp 98.6 F (37 C) (Oral)   Resp 18   Ht 5\' 4"  (1.626 m)   Wt 85.3 kg (188 lb)   SpO2 92%   BMI 32.27 kg/m  Physical Exam  Constitutional: She is oriented to person, place, and time. No distress.  72 year old female.  Holding her abdomen.  Complaining of pain and nausea.  Appears in mild distress.  HENT:  Head: Normocephalic.  Eyes: No scleral icterus.  Oropharynx shows mucous membranes.  Neck: Normal range of motion. Neck supple. No thyromegaly present.  Cardiovascular: Normal rate and regular rhythm. Exam reveals no gallop and no friction rub.  No murmur heard. No JVD.  Sinus tachycardia.  Pulmonary/Chest: Effort normal and breath sounds normal. No respiratory distress. She has no wheezes. She has no rales.  Mild wheezing.  Left midlung diffuse rhonchi.  Abdominal: She exhibits no distension. There is no tenderness. There is no rebound.  Obese.  Soft.  No focal tenderness or peritoneal irritation.  Musculoskeletal: Normal range of motion.  Neurological: She is alert and oriented to person, place, and time.  Skin: No rash noted.  Full-thickness ulceration approximately 1.5 cm on the mid calf on the right lower leg.  Has a superficial abrasion posterior aspect mid right lower leg.  She has palpable pulses distally with good capillary refill.  Psychiatric: She has a normal mood and affect. Her behavior is normal.     ED Treatments / Results  Labs (all labs ordered are listed, but only abnormal results are displayed) Labs Reviewed  CBC WITH DIFFERENTIAL/PLATELET - Abnormal; Notable for the following components:      Result Value   WBC 17.7 (*)    RBC 3.68 (*)    Hemoglobin 10.1 (*)    HCT 32.2 (*)    RDW 18.2 (*)    Neutro Abs 16.3 (*)    All other components within normal limits  COMPREHENSIVE METABOLIC PANEL - Abnormal; Notable for the following components:   CO2 18 (*)     BUN 90 (*)    Creatinine, Ser 4.00 (*)    Calcium 8.8 (*)    Albumin 2.3 (*)    GFR calc non Af Amer 10 (*)    GFR calc Af Amer 12 (*)    All other components within normal limits  TROPONIN I - Abnormal; Notable for the following components:   Troponin I 0.09 (*)    All other components within normal limits  CBG MONITORING, ED - Abnormal; Notable for the following components:   Glucose-Capillary 49 (*)    All other components within normal limits  CBG MONITORING, ED - Abnormal; Notable for the following components:   Glucose-Capillary 53 (*)    All other components within normal limits  CBG MONITORING, ED - Abnormal; Notable for the following components:   Glucose-Capillary 190 (*)    All other components within normal limits  CBG MONITORING, ED - Abnormal; Notable for the following components:   Glucose-Capillary 163 (*)    All other components within normal limits  CULTURE, BLOOD (ROUTINE X 2)  CULTURE, BLOOD (ROUTINE X 2)  LIPASE, BLOOD  URINALYSIS, ROUTINE W REFLEX MICROSCOPIC  TROPONIN I  I-STAT CG4 LACTIC ACID, ED    EKG  EKG Interpretation None       Radiology Ct Abdomen Pelvis Wo Contrast  Result Date: 11/02/2017 CLINICAL DATA:  Emesis for the last 3 days.  Intermittent fever. EXAM: CT ABDOMEN AND PELVIS WITHOUT CONTRAST TECHNIQUE: Multidetector CT imaging of the abdomen and pelvis was performed following the standard protocol without IV contrast. COMPARISON:  09/09/2017.  08/31/2017. FINDINGS: Lower chest: There is a small left effusion. There is segmental consolidation in the left lower  lobe suggesting pneumonia. The heart is mildly enlarged. Hepatobiliary: No liver parenchymal abnormality is seen. There is some layering sludge or stones in the gallbladder. No CT evidence of cholecystitis. Pancreas: Normal Spleen: Normal Adrenals/Urinary Tract: Adrenal glands are normal. Kidneys show vascular calcifications but no acute finding. Bladder appears normal. Stomach/Bowel:  No acute bowel pathology is seen. No sign of obstruction or inflammatory disease. Vascular/Lymphatic: Advanced aortic atherosclerosis. Atherosclerosis of the aortic branch vessels. IVC is normal. No retroperitoneal adenopathy. Reproductive: Previous hysterectomy.  No pelvic mass. Other: No free fluid or air. Musculoskeletal: Chronic lumbar degenerative changes. No acute bone finding. IMPRESSION: Segmental consolidation in the left lower lobe consistent with bronchopneumonia. Small amount of left pleural fluid. Sludge or gallstones in the gallbladder. Aortic atherosclerosis. No acute bowel pathology seen. No obstruction or inflammatory finding. Electronically Signed   By: Nelson Chimes M.D.   On: 11/02/2017 18:28   Dg Tibia/fibula Right  Result Date: 11/02/2017 CLINICAL DATA:  Right lower leg ulcers. EXAM: RIGHT TIBIA AND FIBULA - 2 VIEW COMPARISON:  None. FINDINGS: There is no evidence of fracture or other focal bone lesions. Surgical screw is seen in distal right fibula. Vascular calcifications are noted. No lytic destruction is seen to suggest osteomyelitis. IMPRESSION: No fracture or dislocation is noted. No definite evidence of osteomyelitis. Electronically Signed   By: Marijo Conception, M.D.   On: 11/02/2017 17:30   Dg Chest Port 1 View  Result Date: 11/02/2017 CLINICAL DATA:  Shortness of breath and cough. EXAM: PORTABLE CHEST 1 VIEW COMPARISON:  August 13, 2017 FINDINGS: Stable cardiomegaly. Mild edema. No other interval changes or acute abnormalities. IMPRESSION: Cardiomegaly and mild edema. Electronically Signed   By: Dorise Bullion III M.D   On: 11/02/2017 19:11    Procedures Procedures (including critical care time)  Medications Ordered in ED Medications  dextrose (GLUTOSE) 40 % oral gel (not administered)  0.9 %  sodium chloride infusion (not administered)  azithromycin (ZITHROMAX) 500 mg in dextrose 5 % 250 mL IVPB (not administered)  cefTRIAXone (ROCEPHIN) 2 g in dextrose 5 % 50 mL  IVPB (not administered)  0.9 %  sodium chloride infusion (not administered)  dextrose 50 % solution 25 mL (25 mLs Intravenous Given 11/02/17 1547)  sodium chloride 0.9 % bolus 500 mL (0 mLs Intravenous Stopped 11/02/17 1949)  pantoprazole (PROTONIX) injection 40 mg (40 mg Intravenous Given 11/02/17 1547)  ondansetron (ZOFRAN) injection 4 mg (4 mg Intravenous Given 11/02/17 1547)  albuterol (PROVENTIL) (2.5 MG/3ML) 0.083% nebulizer solution 2.5 mg (2.5 mg Nebulization Given 11/02/17 1728)     Initial Impression / Assessment and Plan / ED Course  I have reviewed the triage vital signs and the nursing notes.  Pertinent labs & imaging results that were available during my care of the patient were reviewed by me and considered in my medical decision making (see chart for details).  : EKG sinus tachycardia.  No ST or T wave changes.  No obvious ectopy or injury  Patient with multiple complaints.  IV is placed.  Her blood sugar was 46.  Was given half amp D50.  Does maintain close.  On multiple rechecks.  Chest x-ray is negative.  Abdominal CT shows no acute intra-abdominal finding but shows large left lower lobe pneumonia/infiltrate.  Has leukocytosis of 17,000.  Hemoglobin 10.  CO2 18 anion gap 13.  Baseline creatinine for the patient is 2.7.  She is 4.0 with BUN of 90.  Clinically appears dry and has been vomiting for several  days.  Troponin I 0.09.  Not having chest pain.  Urine pending.  Blood cultures pending.  I requested Rocephin, and Zithromax for community acquired lobar pneumonia.  Has been given IV fluids 1 L over several hours.  We will continue with 100/h for her dehydration.  Have placed a call to the hospitalist regarding admission.    Final Clinical Impressions(s) / ED Diagnoses   Final diagnoses:  Community acquired pneumonia of left lower lobe of lung ([place])  Generalized abdominal pain  Stage 3 chronic kidney disease ([place])  AKI (acute kidney injury) [facility])    ED Discharge Orders     None       Tanna Furry, MD 11/02/17 2052

## 2017-11-02 NOTE — ED Notes (Signed)
Gave blood to phlebotomist

## 2017-11-02 NOTE — ED Notes (Signed)
Pt placed on purewick 

## 2017-11-03 ENCOUNTER — Other Ambulatory Visit: Payer: Self-pay

## 2017-11-03 ENCOUNTER — Encounter (HOSPITAL_COMMUNITY): Payer: Self-pay | Admitting: *Deleted

## 2017-11-03 ENCOUNTER — Inpatient Hospital Stay (HOSPITAL_COMMUNITY): Payer: Medicare Other

## 2017-11-03 DIAGNOSIS — I10 Essential (primary) hypertension: Secondary | ICD-10-CM

## 2017-11-03 DIAGNOSIS — I509 Heart failure, unspecified: Secondary | ICD-10-CM | POA: Diagnosis not present

## 2017-11-03 DIAGNOSIS — E1151 Type 2 diabetes mellitus with diabetic peripheral angiopathy without gangrene: Secondary | ICD-10-CM | POA: Diagnosis not present

## 2017-11-03 DIAGNOSIS — I5032 Chronic diastolic (congestive) heart failure: Secondary | ICD-10-CM

## 2017-11-03 DIAGNOSIS — N184 Chronic kidney disease, stage 4 (severe): Secondary | ICD-10-CM

## 2017-11-03 DIAGNOSIS — I361 Nonrheumatic tricuspid (valve) insufficiency: Secondary | ICD-10-CM

## 2017-11-03 DIAGNOSIS — R269 Unspecified abnormalities of gait and mobility: Secondary | ICD-10-CM | POA: Diagnosis not present

## 2017-11-03 DIAGNOSIS — J441 Chronic obstructive pulmonary disease with (acute) exacerbation: Secondary | ICD-10-CM

## 2017-11-03 DIAGNOSIS — E782 Mixed hyperlipidemia: Secondary | ICD-10-CM

## 2017-11-03 DIAGNOSIS — I4892 Unspecified atrial flutter: Secondary | ICD-10-CM

## 2017-11-03 DIAGNOSIS — M6281 Muscle weakness (generalized): Secondary | ICD-10-CM | POA: Diagnosis not present

## 2017-11-03 DIAGNOSIS — N179 Acute kidney failure, unspecified: Secondary | ICD-10-CM

## 2017-11-03 DIAGNOSIS — N058 Unspecified nephritic syndrome with other morphologic changes: Secondary | ICD-10-CM

## 2017-11-03 LAB — URINALYSIS, ROUTINE W REFLEX MICROSCOPIC
BACTERIA UA: NONE SEEN
BILIRUBIN URINE: NEGATIVE
GLUCOSE, UA: NEGATIVE mg/dL
HGB URINE DIPSTICK: NEGATIVE
KETONES UR: NEGATIVE mg/dL
Nitrite: NEGATIVE
PROTEIN: 100 mg/dL — AB
Specific Gravity, Urine: 1.01 (ref 1.005–1.030)
pH: 8 (ref 5.0–8.0)

## 2017-11-03 LAB — ECHOCARDIOGRAM COMPLETE
CHL CUP DOP CALC LVOT VTI: 23 cm
CHL CUP RV SYS PRESS: 63 mmHg
CHL CUP TV REG PEAK VELOCITY: 347 cm/s
E/e' ratio: 31.35
EWDT: 229 ms
FS: 30 % (ref 28–44)
Height: 64 in
IVS/LV PW RATIO, ED: 0.91
LA ID, A-P, ES: 47 mm
LA diam end sys: 47 mm
LA vol A4C: 101 ml
LA vol index: 53.1 mL/m2
LA vol: 105 mL
LADIAMINDEX: 2.38 cm/m2
LDCA: 2.54 cm2
LV E/e' medial: 31.35
LV E/e'average: 31.35
LV SIMPSON'S DISK: 60
LV TDI E'LATERAL: 5.55
LV TDI E'MEDIAL: 5.33
LV dias vol index: 51 mL/m2
LV dias vol: 101 mL (ref 46–106)
LVELAT: 5.55 cm/s
LVOT diameter: 18 mm
LVOT peak grad rest: 4 mmHg
LVOTPV: 102 cm/s
LVOTSV: 58 mL
LVSYSVOL: 40 mL
LVSYSVOLIN: 20 mL/m2
Lateral S' vel: 9.03 cm/s
MV Dec: 229
MV Peak grad: 12 mmHg
MV pk A vel: 116 m/s
MV pk E vel: 174 m/s
P 1/2 time: 259 ms
PW: 14.3 mm — AB (ref 0.6–1.1)
RV TAPSE: 16 mm
Stroke v: 61 ml
TR max vel: 347 cm/s
Weight: 2959.46 oz

## 2017-11-03 LAB — GLUCOSE, CAPILLARY
Glucose-Capillary: 147 mg/dL — ABNORMAL HIGH (ref 65–99)
Glucose-Capillary: 162 mg/dL — ABNORMAL HIGH (ref 65–99)
Glucose-Capillary: 189 mg/dL — ABNORMAL HIGH (ref 65–99)
Glucose-Capillary: 197 mg/dL — ABNORMAL HIGH (ref 65–99)
Glucose-Capillary: 207 mg/dL — ABNORMAL HIGH (ref 65–99)
Glucose-Capillary: 291 mg/dL — ABNORMAL HIGH (ref 65–99)

## 2017-11-03 LAB — CBC
HEMATOCRIT: 27.9 % — AB (ref 36.0–46.0)
Hemoglobin: 8.8 g/dL — ABNORMAL LOW (ref 12.0–15.0)
MCH: 27.4 pg (ref 26.0–34.0)
MCHC: 31.5 g/dL (ref 30.0–36.0)
MCV: 86.9 fL (ref 78.0–100.0)
PLATELETS: 279 10*3/uL (ref 150–400)
RBC: 3.21 MIL/uL — ABNORMAL LOW (ref 3.87–5.11)
RDW: 18.1 % — AB (ref 11.5–15.5)
WBC: 18.2 10*3/uL — ABNORMAL HIGH (ref 4.0–10.5)

## 2017-11-03 LAB — BASIC METABOLIC PANEL
Anion gap: 13 (ref 5–15)
BUN: 89 mg/dL — AB (ref 6–20)
CO2: 18 mmol/L — ABNORMAL LOW (ref 22–32)
CREATININE: 3.97 mg/dL — AB (ref 0.44–1.00)
Calcium: 8.6 mg/dL — ABNORMAL LOW (ref 8.9–10.3)
Chloride: 103 mmol/L (ref 101–111)
GFR calc Af Amer: 12 mL/min — ABNORMAL LOW (ref >60)
GFR calc non Af Amer: 10 mL/min — ABNORMAL LOW (ref >60)
GLUCOSE: 235 mg/dL — AB (ref 65–99)
POTASSIUM: 4.2 mmol/L (ref 3.5–5.1)
SODIUM: 134 mmol/L — AB (ref 135–145)

## 2017-11-03 LAB — RESPIRATORY PANEL BY PCR
ADENOVIRUS-RVPPCR: NOT DETECTED
Bordetella pertussis: NOT DETECTED
CHLAMYDOPHILA PNEUMONIAE-RVPPCR: NOT DETECTED
CORONAVIRUS 229E-RVPPCR: NOT DETECTED
CORONAVIRUS NL63-RVPPCR: NOT DETECTED
Coronavirus HKU1: NOT DETECTED
Coronavirus OC43: NOT DETECTED
INFLUENZA A-RVPPCR: NOT DETECTED
INFLUENZA B-RVPPCR: NOT DETECTED
MYCOPLASMA PNEUMONIAE-RVPPCR: NOT DETECTED
Metapneumovirus: NOT DETECTED
PARAINFLUENZA VIRUS 1-RVPPCR: NOT DETECTED
PARAINFLUENZA VIRUS 4-RVPPCR: NOT DETECTED
Parainfluenza Virus 2: NOT DETECTED
Parainfluenza Virus 3: NOT DETECTED
RESPIRATORY SYNCYTIAL VIRUS-RVPPCR: DETECTED — AB
Rhinovirus / Enterovirus: NOT DETECTED

## 2017-11-03 LAB — TROPONIN I
TROPONIN I: 0.09 ng/mL — AB (ref <0.03)
TROPONIN I: 0.09 ng/mL — AB (ref <0.03)
Troponin I: 0.08 ng/mL (ref <0.03)

## 2017-11-03 LAB — STREP PNEUMONIAE URINARY ANTIGEN: Strep Pneumo Urinary Antigen: NEGATIVE

## 2017-11-03 LAB — INFLUENZA PANEL BY PCR (TYPE A & B)
INFLAPCR: NEGATIVE
INFLBPCR: NEGATIVE

## 2017-11-03 LAB — HEMOGLOBIN AND HEMATOCRIT, BLOOD
HEMATOCRIT: 27.6 % — AB (ref 36.0–46.0)
HEMOGLOBIN: 8.5 g/dL — AB (ref 12.0–15.0)

## 2017-11-03 LAB — PROTIME-INR
INR: 2.56
Prothrombin Time: 27.3 seconds — ABNORMAL HIGH (ref 11.4–15.2)

## 2017-11-03 LAB — BLOOD GAS, ARTERIAL
Acid-base deficit: 5.4 mmol/L — ABNORMAL HIGH (ref 0.0–2.0)
Bicarbonate: 19.7 mmol/L — ABNORMAL LOW (ref 20.0–28.0)
Drawn by: 23430
O2 CONTENT: 3 L/min
O2 SAT: 81.9 %
PCO2 ART: 39 mmHg (ref 32.0–48.0)
Patient temperature: 37
pH, Arterial: 7.322 — ABNORMAL LOW (ref 7.350–7.450)
pO2, Arterial: 52.7 mmHg — ABNORMAL LOW (ref 83.0–108.0)

## 2017-11-03 LAB — BRAIN NATRIURETIC PEPTIDE: B Natriuretic Peptide: 3320 pg/mL — ABNORMAL HIGH (ref 0.0–100.0)

## 2017-11-03 LAB — PROCALCITONIN: Procalcitonin: 20.71 ng/mL

## 2017-11-03 LAB — MRSA PCR SCREENING: MRSA by PCR: NEGATIVE

## 2017-11-03 MED ORDER — BRIMONIDINE TARTRATE 0.2 % OP SOLN
1.0000 [drp] | Freq: Every day | OPHTHALMIC | Status: DC
  Administered 2017-11-03 – 2017-11-13 (×12): 1 [drp] via OPHTHALMIC
  Filled 2017-11-03: qty 5

## 2017-11-03 MED ORDER — HYDROMORPHONE HCL 1 MG/ML IJ SOLN
1.0000 mg | INTRAMUSCULAR | Status: DC | PRN
Start: 2017-11-03 — End: 2017-11-03
  Administered 2017-11-03: 1 mg via INTRAVENOUS
  Filled 2017-11-03: qty 1

## 2017-11-03 MED ORDER — METOPROLOL TARTRATE 5 MG/5ML IV SOLN
2.5000 mg | Freq: Four times a day (QID) | INTRAVENOUS | Status: DC
  Administered 2017-11-03 – 2017-11-13 (×38): 2.5 mg via INTRAVENOUS
  Filled 2017-11-03 (×36): qty 5

## 2017-11-03 MED ORDER — METHYLPREDNISOLONE SODIUM SUCC 125 MG IJ SOLR
60.0000 mg | Freq: Three times a day (TID) | INTRAMUSCULAR | Status: DC
  Administered 2017-11-03 – 2017-11-05 (×7): 60 mg via INTRAVENOUS
  Filled 2017-11-03 (×7): qty 2

## 2017-11-03 MED ORDER — PIPERACILLIN-TAZOBACTAM IN DEX 2-0.25 GM/50ML IV SOLN
2.2500 g | Freq: Four times a day (QID) | INTRAVENOUS | Status: DC
  Filled 2017-11-03 (×2): qty 50

## 2017-11-03 MED ORDER — HYDRALAZINE HCL 20 MG/ML IJ SOLN
5.0000 mg | Freq: Four times a day (QID) | INTRAMUSCULAR | Status: DC | PRN
  Administered 2017-11-07 – 2017-11-08 (×2): 5 mg via INTRAVENOUS
  Filled 2017-11-03 (×3): qty 1

## 2017-11-03 MED ORDER — PIPERACILLIN-TAZOBACTAM 3.375 G IVPB
3.3750 g | Freq: Two times a day (BID) | INTRAVENOUS | Status: DC
  Administered 2017-11-03 – 2017-11-09 (×12): 3.375 g via INTRAVENOUS
  Filled 2017-11-03 (×12): qty 50

## 2017-11-03 MED ORDER — METHYLPREDNISOLONE SODIUM SUCC 40 MG IJ SOLR: 40.0000 mg | Freq: Every day | INTRAMUSCULAR | Status: DC

## 2017-11-03 MED ORDER — METHYLPREDNISOLONE SODIUM SUCC 40 MG IJ SOLR: 40.0000 mg | Freq: Three times a day (TID) | INTRAMUSCULAR | Status: DC

## 2017-11-03 MED ORDER — FUROSEMIDE 10 MG/ML IJ SOLN
80.0000 mg | Freq: Once | INTRAMUSCULAR | Status: AC
  Administered 2017-11-03: 80 mg via INTRAVENOUS
  Filled 2017-11-03: qty 8

## 2017-11-03 MED ORDER — FUROSEMIDE 10 MG/ML IJ SOLN
20.0000 mg/h | INTRAMUSCULAR | Status: DC
  Administered 2017-11-03: 20 mg/h via INTRAVENOUS
  Filled 2017-11-03: qty 25

## 2017-11-03 MED ORDER — HYDRALAZINE HCL 20 MG/ML IJ SOLN: 10.0000 mg | Freq: Four times a day (QID) | INTRAMUSCULAR | Status: DC | PRN

## 2017-11-03 MED ORDER — IPRATROPIUM-ALBUTEROL 0.5-2.5 (3) MG/3ML IN SOLN
3.0000 mL | Freq: Four times a day (QID) | RESPIRATORY_TRACT | Status: DC
  Administered 2017-11-03 – 2017-11-04 (×6): 3 mL via RESPIRATORY_TRACT
  Filled 2017-11-03 (×5): qty 3

## 2017-11-03 MED ORDER — ACETAMINOPHEN 650 MG RE SUPP: 650.0000 mg | RECTAL | Status: DC | PRN

## 2017-11-03 MED ORDER — FUROSEMIDE 10 MG/ML IJ SOLN
120.0000 mg | Freq: Two times a day (BID) | INTRAVENOUS | Status: DC
  Administered 2017-11-03 – 2017-11-08 (×12): 120 mg via INTRAVENOUS
  Filled 2017-11-03 (×9): qty 12
  Filled 2017-11-03: qty 10
  Filled 2017-11-03 (×4): qty 12

## 2017-11-03 MED ORDER — BOOST / RESOURCE BREEZE PO LIQD CUSTOM
1.0000 | Freq: Three times a day (TID) | ORAL | Status: DC
  Administered 2017-11-03 – 2017-11-13 (×21): 1 via ORAL

## 2017-11-03 MED ORDER — FUROSEMIDE 10 MG/ML IJ SOLN
INTRAMUSCULAR | Status: AC
  Filled 2017-11-03: qty 30

## 2017-11-03 MED ORDER — WARFARIN SODIUM 5 MG PO TABS
5.0000 mg | ORAL_TABLET | Freq: Once | ORAL | Status: AC
  Administered 2017-11-03: 5 mg via ORAL
  Filled 2017-11-03: qty 1

## 2017-11-03 MED ORDER — HYDROMORPHONE HCL 1 MG/ML IJ SOLN
0.5000 mg | INTRAMUSCULAR | Status: DC | PRN
  Administered 2017-11-03 – 2017-11-13 (×28): 0.5 mg via INTRAVENOUS
  Filled 2017-11-03 (×28): qty 1

## 2017-11-03 MED ORDER — PIPERACILLIN SOD-TAZOBACTAM SO 2.25 (2-0.25) G IV SOLR
2.2500 g | Freq: Three times a day (TID) | INTRAVENOUS | Status: DC
  Administered 2017-11-03: 2.25 g via INTRAVENOUS
  Filled 2017-11-03 (×4): qty 2.25

## 2017-11-03 MED ORDER — PANTOPRAZOLE SODIUM 40 MG IV SOLR
40.0000 mg | INTRAVENOUS | Status: DC
  Administered 2017-11-03 – 2017-11-06 (×4): 40 mg via INTRAVENOUS
  Filled 2017-11-03 (×4): qty 40

## 2017-11-03 MED ORDER — PNEUMOCOCCAL VAC POLYVALENT 25 MCG/0.5ML IJ INJ
0.5000 mL | INJECTION | INTRAMUSCULAR | Status: AC
  Administered 2017-11-04: 0.5 mL via INTRAMUSCULAR
  Filled 2017-11-03: qty 0.5

## 2017-11-03 MED ORDER — FOLIC ACID 5 MG/ML IJ SOLN
1.0000 mg | Freq: Every day | INTRAMUSCULAR | Status: DC
  Administered 2017-11-04 – 2017-11-05 (×2): 1 mg via INTRAVENOUS
  Filled 2017-11-03 (×8): qty 0.2

## 2017-11-03 MED ORDER — TIMOLOL MALEATE 0.5 % OP SOLN
1.0000 [drp] | Freq: Every day | OPHTHALMIC | Status: DC
  Administered 2017-11-03 – 2017-11-13 (×11): 1 [drp] via OPHTHALMIC
  Filled 2017-11-03 (×2): qty 5

## 2017-11-03 NOTE — Progress Notes (Signed)
CRITICAL VALUE ALERT  Critical Value: RSV     Date & Time Notied: 11/03/17 1736  Provider Notified: TAT  Orders Received/Actions taken:

## 2017-11-03 NOTE — Clinical Social Work Note (Signed)
Received CSW consult indicating patient inquiring about HHC at dc. RN CM will assess and discuss with pt. Will clear CSW consult.

## 2017-11-03 NOTE — Progress Notes (Signed)
Initial Nutrition Assessment  DOCUMENTATION CODES:      INTERVENTION:  Boost Breeze po TID, each supplement provides 250 kcal and 9 grams of protein    NUTRITION DIAGNOSIS:   Inadequate oral intake related to poor appetite as evidenced by meal completion < 50%, percent weight loss.   GOAL:  Patient will meet greater than or equal to 90% of their needs   MONITOR:   Diet advancement, PO intake, Supplement acceptance, Labs, I & O's  REASON FOR ASSESSMENT:   Malnutrition Screening Tool    ASSESSMENT:  [name] is a 72 yo female with hx of CKD-IV, CHF, Folic acid deficiency, DM-2, HTN and lupus.   Liquid diet currently 25-50% consumed. Her lunch tray is here and very little (25%- broth and jello)  has been consumed.  Family has been assisting with feeding today. Patient is unable to feed herself at baseline. The husband and daughter are here and say she has not been eating well for several weeks. The patient says she "feels sick on her stomach" a lot and does not associate it with eating any certain food. Weight hx shows significant loss 9% < 90 days. Unable to complete physical exam at this time. Expect this patient meets criteria for malnutrition. Will continue to follow.  Diet Order:  Diet clear liquid Room service appropriate? Yes; Fluid consistency: Thin  EDUCATION NEEDS:  None identified.    Skin:  Skin Assessment: Reviewed RN Assessment  Last BM:  2/4   Height:   Ht Readings from Last 1 Encounters:  11/03/17 5\' 4"  (1.626 m)    Weight:   Wt Readings from Last 1 Encounters:  11/03/17 184 lb 15.5 oz (83.9 kg)    Ideal Body Weight:  55 kg  BMI:  Body mass index is 31.75 kg/m.  Estimated Nutritional Needs:   Kcal:  0321-2248  Protein:  60-68 gr  Fluid:  <2000 ml daily   Colman Cater Dana,RD,CSG,LDN Office: [phone] Pager: [phone]

## 2017-11-03 NOTE — Progress Notes (Addendum)
Assessed patient at bedside immediately after I was paged for concerns regarding worsening respiratory distress as well as hypoxemia.  Patient had been receiving a breathing treatment with little to no impact to her symptoms and was also on nasal cannula with high flow oxygen.  She appears to be volume overloaded and had little to no response from the 80mg  IV Lasix which was given about 2 hours ago.  Respiratory therapy has now placed the patient on BiPAP support with FiO2 45% 14/6.  She appears to be tolerating this well with improvement in oxygen saturation noted to 99%.  I have placed orders for initiation of Lasix IV drip at 20 mg/h with no bolus as she had just received 80 mg of IV Lasix within the last 2 hours. I have spoken with Juliann Pulse who is the [facility] tonight and she states that there are currently no beds available for transfer to stepdown/ICU.  I plan to reassess within the next 2-3 hours to check urine output and if this is inadequate, we will plan to increase Lasix drip to 40 mg/h. Recheck H/H ordered for 0800 (6 hours after last draw) to assess for any significant occult blood loss. No overt bleeding currently noted. 1mg  of Dilaudid was given earlier tonight for patient's abdominal pain which seems to have helped.  Foley placed earlier this morning with 200 cc of urine output noted per nursing staff.  She appears to be comfortable and in no acute respiratory distress at this time and tolerating BiPAP well.  We will continue Lasix drip at current dose of 20 mg/h and await further reassessment by nephrology this morning.

## 2017-11-03 NOTE — Progress Notes (Signed)
ANTICOAGULATION CONSULT NOTE - follow up  Pharmacy Consult for Warfarin (home med) Indication: atrial fibrillation  Allergies  Allergen Reactions  . Ace Inhibitors Cough   Patient Measurements: Height: 5\' 4"  (162.6 cm) Weight: 184 lb 15.5 oz (83.9 kg) IBW/kg (Calculated) : 54.7  Vital Signs: Temp: 99.6 F (37.6 C) (02/05 0017) Temp Source: Oral (02/05 0017) BP: 148/65 (02/05 0639) Pulse Rate: 83 (02/05 0646)  Labs: Recent Labs    11/02/17 1536 11/02/17 1537 11/02/17 2011 11/03/17 0208 11/03/17 0801  HGB 10.1*  --   --  8.8* 8.5*  HCT 32.2*  --   --  27.9* 27.6*  PLT 262  --   --  279  --   LABPROT  --  24.3*  --  27.3*  --   INR  --  2.21  --  2.56  --   CREATININE 4.00*  --   --  3.97*  --   TROPONINI 0.09*  --  0.08* 0.09* 0.09*   Estimated Creatinine Clearance: 13.6 mL/min (A) (by C-G formula based on SCr of 3.97 mg/dL (H)).  Medical History: Past Medical History:  Diagnosis Date  . Anemia   . Arthritis   . Asthma   . Atrial fibrillation ([place])   . Atrial flutter [facility])    TEE/DCCV December 2013 - on Xarelto [facility])  . Bone spur of toe    Right 5th toe  . Chronic diastolic CHF (congestive heart failure) (HCC)    a. EF 50-55% by echo in 2015  . CKD (chronic kidney disease) stage 4, GFR 15-29 ml/min (HCC)    a. s/p fistula placement but not on HD. Followed by Nephrology.   Marland Kitchen COPD (chronic obstructive pulmonary disease) ([place])   . DVT of upper extremity (deep vein thrombosis) ([place])    Right basilic vein, June 4709  . Essential hypertension, benign   . Fractures   . SLE (systemic lupus erythematosus) ([place])   . Type 2 diabetes mellitus ([place])   . UTI (lower urinary tract infection)    Medications:  Medications Prior to Admission  Medication Sig Dispense Refill Last Dose  . acetaminophen (TYLENOL) 500 MG tablet Take 1,000 mg by mouth every 6 (six) hours as needed. For pain   Taking  . albuterol (ACCUNEB) 0.63 MG/3ML nebulizer solution Take 1 ampule by  nebulization every 6 (six) hours as needed for wheezing or shortness of breath.    11/01/2017 at Unknown time  . albuterol (PROVENTIL HFA;VENTOLIN HFA) 108 (90 BASE) MCG/ACT inhaler Inhale 2 puffs into the lungs every 6 (six) hours as needed. For shortness of breath   11/01/2017 at Unknown time  . amLODipine (NORVASC) 10 MG tablet Take 1 tablet (10 mg total) by mouth at bedtime. 30 tablet 0 11/02/2017 at Unknown time  . atorvastatin (LIPITOR) 40 MG tablet Take 40 mg at bedtime by mouth.    11/01/2017 at Unknown time  . brimonidine-timolol (COMBIGAN) 0.2-0.5 % ophthalmic solution Place 1 drop into the left eye daily.    11/01/2017 at Unknown time  . calcitRIOL (ROCALTROL) 0.25 MCG capsule Take 0.25 mcg by mouth as directed. Take on Mondays Wednesdays and Fridays   11/02/2017 at Unknown time  . diclofenac (VOLTAREN) 0.1 % ophthalmic solution Place 1 drop into the left eye daily. Uses at lunch time   11/01/2017 at Unknown time  . dicyclomine (BENTYL) 10 MG capsule Take 10 mg by mouth 4 (four) times daily -  before meals and at bedtime.   11/02/2017 at  Unknown time  . febuxostat (ULORIC) 40 MG tablet Take 40 mg by mouth daily.   11/02/2017 at Unknown time  . folic acid (FOLVITE) 1 MG tablet Take 1 mg by mouth daily.   11/02/2017 at Unknown time  . furosemide (LASIX) 80 MG tablet Take 160 mg by mouth 2 (two) times daily.    11/02/2017 at Unknown time  . HUMALOG KWIKPEN 100 UNIT/ML KiwkPen INJECT UP TO 11 UNITS SUBCUTANEOUSLY BEFORE MEALS THREE TIMES DAILY. 15 mL 2 11/02/2017 at Unknown time  . HYDROcodone-acetaminophen (NORCO/VICODIN) 5-325 MG tablet 1 or 2 tabs PO q12 hours prn pain 10 tablet 0 11/01/2017 at Unknown time  . hydroxychloroquine (PLAQUENIL) 200 MG tablet Take 200 mg by mouth as directed. Take 1 1/2 tablets daily   11/02/2017 at 900  . Insulin Detemir (LEVEMIR FLEXTOUCH) 100 UNIT/ML Pen Inject 10 Units into the skin daily at 10 pm. 5 pen 2 11/01/2017 at Unknown time  . metoprolol tartrate (LOPRESSOR) 50 MG tablet TAKE ONE  TABLET BY MOUTH TWICE DAILY. 60 tablet 6 11/02/2017 at 900  . Multiple Vitamins-Minerals (CENTRUM SILVER PO) Take 1 tablet by mouth daily.   11/02/2017 at Unknown time  . ONE TOUCH ULTRA TEST test strip TEST 4 TIMES A DAY AS DIRECTED. 150 each 5 11/02/2017 at Unknown time  . ONETOUCH DELICA LANCETS 00X MISC USE TO TEST 4 TIMES DAILY. 100 each 5 11/02/2017 at Unknown time  . predniSONE (DELTASONE) 5 MG tablet Take 5 mg by mouth daily with breakfast.   11/02/2017 at Unknown time  . travoprost, benzalkonium, (TRAVATAN) 0.004 % ophthalmic solution Place 1 drop into the left eye at bedtime.   11/01/2017 at Unknown time  . UNIFINE PENTIPS 31G X 6 MM MISC USE AS DIRECTED AT BEDTIME WITH LEVEMIR AND WITH SLIDING SCALE INSULIN. 150 each 5 11/02/2017 at Unknown time  . Vitamin D, Ergocalciferol, (DRISDOL) 50000 units CAPS capsule TAKE 1 CAPSULE BY MOUTH ONCE WEEKLY. 12 capsule 0 Past Week at Unknown time  . warfarin (COUMADIN) 5 MG tablet Take 1 tablet daily (Patient taking differently: Take 5 mg by mouth daily at 6 PM. Take 1 tablet daily) 180 tablet 3 11/01/2017 at 1800    Home dose = 5mg  daily per PTA med list.  Assessment: Okay for Protocol, INR @ goal.  Plan to continue home dosing.  Goal of Therapy:  INR 2-3   Plan:  Warfarin 5mg  PO x 1  Daily PT/INR  Sevag Shearn A 11/03/2017,10:36 AM

## 2017-11-03 NOTE — Plan of Care (Signed)
MD phoned to check on progress of patient. Ordered foley catheter for aggressive diuresis and urinary retention. Verbal order received.

## 2017-11-03 NOTE — Plan of Care (Signed)
Notified Dr. Manuella Ghazi about patient's status. Patient arrived to floor with labored breathing and was diaphoretic. Checked patient's vitals and blood sugar. Patient's systolic blood pressure found to be in the 200s each time. Discussed plan of care with MD. Patient previously received boluses of fluid in ED. Patient reports that she has not taken any of her blood pressure medications or diuretics. Patient also had to be placed on 3 Liters of oxygen prior to arriving to floor due to decreased oxygen saturation of 88%. See updated orders by MD.

## 2017-11-03 NOTE — Progress Notes (Signed)
*  PRELIMINARY RESULTS* Echocardiogram 2D Echocardiogram has been performed.  [name] 11/03/2017, 2:39 PM

## 2017-11-03 NOTE — Progress Notes (Signed)
PROGRESS NOTE  [name] TDD:[id number] DOB: 06-19-1946 DOA: 11/02/2017 PCP: Sharilyn Sites, MD  Brief History:  72 year old female with a history of CKD stage IV, folic acid deficiency, diastolic CHF, atrial fibrillation on warfarin, COPD, hypertension, diabetes mellitus type 2, lupus, focal segmental glomerulosclerosis, presenting with 2-3-day history of worsening of her chronic abdominal pain with associated nausea and vomiting.  In addition, the patient has been complaining of a nonproductive cough with low-grade fevers.  She denies any sick contacts, and she no longer smokes.  There is been no recent travels.  She has had some loose stools over the past 2-3 days without hematochezia or melena.  She has not been on antibiotics recently.  Since admission, there has not been any vomiting or diarrhea.  In the early morning 11/03/17, the patient developed respiratory distress.  The patient was placed on BiPAP and started on a furosemide drip.  Nephrology was consulted.  The patient normally follows Dr. Moshe Cipro at [facility].    Assessment/Plan: Acute respiratory failure with hypoxia -Secondary to COPD exacerbation and pneumonia -Presently stable on BiPAP--FiO2 45%, 14/6 -Wean off BiPAP as able -Start duo nebs -Pulmonary hygiene -Personally reviewed chest x-ray--slight increased interstitial markings -Repeat chest x-ray  Lobar pneumonia -I feel there is a component of aspiration given her recurrent vomiting -Check procalcitonin -Discontinue ceftriaxone -Start Zosyn -MRSA screen -Urine Legionella antigen  COPD exacerbation -Start duo nebs -Start intravenous steroids  Acute on chronic renal failure--CKD stage IV -Multifactorial including infectious process and likely volume depletion -Nephrology consult -Discontinue furosemide drip -Abdominal ultrasound -Baseline creatinine 2.1-2.4 -Presenting creatinine 4.00 -Continue calcitriol  Pyuria -Unfortunately,  urine culture was not obtained -Continue Zosyn  Intractable nausea, vomiting/abdominal pain -11/02/2017 CT abdomen--negative for acute findings except LLL consolidation -Suspect viral gastroenteritis -Symptomatic treatment -Lipase normal -LFTs unremarkable  -no further loose stools or emesis since admission  Chronic diastolic CHF -Patient does not appear volume overloaded -Daily weights  -her previous dry weight~210 continue metoprolol  Atrial fibrillation, type unspecified -Presently in sinus rhythm -Continue warfarin -Continue metoprolol tartrate, convert to IV -Personally reviewed EKG--sinus rhythm, nonspecific T wave change  Diabetes mellitus type 2 -10/20/2017 hemoglobin A1c 5.6 -NovoLog sliding scale -Holding Levemir for now -Adjust insulin based upon CBGs  Essential hypertension -Discontinue amlodipine for now as the patient is npo due to BiPAP -Continue Lopressor IV  SLE/lupus -Continue Plaquenil    Disposition Plan:   Not stable for d/c Family Communication:  No Family at bedside  Consultants:  renal  Code Status:  FULL   DVT Prophylaxis: warfarin   Procedures: As Listed in Progress Note Above  Antibiotics: Ceftriaxone 2/4 Azithromycin 2/4>>> Zosyn 2/5>>>    Subjective: Patient is breathing comfortably on BiPAP presently.  She denies any headache, chest pain, vomiting, diarrhea.  She states her abdominal pain is controlled at the present.  She denies any dysuria or hematuria.  Objective: Vitals:   11/03/17 0210 11/03/17 0249 11/03/17 0639 11/03/17 0646  BP: 136/89  (!) 148/65   Pulse:  94 90 83  Resp:  (!) 25  17  Temp:      TempSrc:      SpO2:  99% 100% 98%  Weight:   83.9 kg (184 lb 15.5 oz)   Height:   5\' 4"  (1.626 m)     Intake/Output Summary (Last 24 hours) at 11/03/2017 0721 Last data filed at 11/03/2017 0640 Gross per 24 hour  Intake -  Output 150  ml  Net -150 ml   Weight change:  Exam:   General:  Pt is alert, follows  commands appropriately, not in acute distress  HEENT: No icterus, No thrush, No neck mass, [place]/AT  Cardiovascular: RRR, S1/S2, no rubs, no gallops  Respiratory: Bibasilar crackles, right greater than left.  Bilateral scattered wheezing.  Abdomen: Soft/+BS, non tender, non distended, no guarding  Extremities: No edema, No lymphangitis, No petechiae, No rashes, no synovitis   Data Reviewed: I have personally reviewed following labs and imaging studies Basic Metabolic Panel: Recent Labs  Lab 11/02/17 1536 11/03/17 0208  NA 136 134*  K 3.9 4.2  CL 105 103  CO2 18* 18*  GLUCOSE 70 235*  BUN 90* 89*  CREATININE 4.00* 3.97*  CALCIUM 8.8* 8.6*   Liver Function Tests: Recent Labs  Lab 11/02/17 1536  AST 37  ALT 27  ALKPHOS 108  BILITOT 0.6  PROT 7.4  ALBUMIN 2.3*   Recent Labs  Lab 11/02/17 1536  LIPASE 20   No results for input(s): AMMONIA in the last 168 hours. Coagulation Profile: Recent Labs  Lab 11/02/17 1537 11/03/17 0208  INR 2.21 2.56   CBC: Recent Labs  Lab 11/02/17 1536 11/03/17 0208  WBC 17.7* 18.2*  NEUTROABS 16.3*  --   HGB 10.1* 8.8*  HCT 32.2* 27.9*  MCV 87.5 86.9  PLT 262 279   Cardiac Enzymes: Recent Labs  Lab 11/02/17 1536 11/02/17 2011 11/03/17 0208  TROPONINI 0.09* 0.08* 0.09*   BNP: Invalid input(s): POCBNP CBG: Recent Labs  Lab 11/02/17 1650 11/02/17 1843 11/02/17 2312 11/03/17 0044 11/03/17 0612  GLUCAP 190* 163* 147* 162* 207*   HbA1C: No results for input(s): HGBA1C in the last 72 hours. Urine analysis:    Component Value Date/Time   COLORURINE YELLOW 11/02/2017 1527   APPEARANCEUR CLOUDY (A) 11/02/2017 1527   LABSPEC 1.010 11/02/2017 1527   PHURINE 8.0 11/02/2017 1527   GLUCOSEU NEGATIVE 11/02/2017 1527   HGBUR NEGATIVE 11/02/2017 1527   BILIRUBINUR NEGATIVE 11/02/2017 1527   KETONESUR NEGATIVE 11/02/2017 1527   PROTEINUR 100 (A) 11/02/2017 1527   UROBILINOGEN 1.0 03/17/2014 1830   NITRITE NEGATIVE  11/02/2017 1527   LEUKOCYTESUR LARGE (A) 11/02/2017 1527   Sepsis Labs: @LABRCNTIP (procalcitonin:4,lacticidven:4) )No results found for this or any previous visit (from the past 240 hour(s)).   Scheduled Meds: . atorvastatin  40 mg Oral QHS  . brimonidine-timolol  1 drop Left Eye Daily  . [START ON 11/04/2017] calcitRIOL  0.25 mcg Oral Once per day on Mon Wed Fri  . febuxostat  40 mg Oral Daily  . folic acid  1 mg Intravenous Daily  . hydroxychloroquine  300 mg Oral Daily  . insulin aspart  0-9 Units Subcutaneous Q4H  . ipratropium-albuterol  3 mL Nebulization Q6H  . ketorolac  1 drop Left Eye Daily  . latanoprost  1 drop Left Eye QHS  . methylPREDNISolone (SOLU-MEDROL) injection  40 mg Intravenous Daily  . metoprolol tartrate  2.5 mg Intravenous Q6H  . pantoprazole (PROTONIX) IV  40 mg Intravenous Q24H  . [START ON 11/04/2017] pneumococcal 23 valent vaccine  0.5 mL Intramuscular Tomorrow-1000  . Warfarin - Pharmacist Dosing Inpatient   Does not apply Q24H   Continuous Infusions: . azithromycin Stopped (11/03/17 0204)  . piperacillin-tazobactam (ZOSYN)  IV      Procedures/Studies: Ct Abdomen Pelvis Wo Contrast  Result Date: 11/02/2017 CLINICAL DATA:  Emesis for the last 3 days.  Intermittent fever. EXAM: CT ABDOMEN AND PELVIS WITHOUT  CONTRAST TECHNIQUE: Multidetector CT imaging of the abdomen and pelvis was performed following the standard protocol without IV contrast. COMPARISON:  09/09/2017.  08/31/2017. FINDINGS: Lower chest: There is a small left effusion. There is segmental consolidation in the left lower lobe suggesting pneumonia. The heart is mildly enlarged. Hepatobiliary: No liver parenchymal abnormality is seen. There is some layering sludge or stones in the gallbladder. No CT evidence of cholecystitis. Pancreas: Normal Spleen: Normal Adrenals/Urinary Tract: Adrenal glands are normal. Kidneys show vascular calcifications but no acute finding. Bladder appears normal.  Stomach/Bowel: No acute bowel pathology is seen. No sign of obstruction or inflammatory disease. Vascular/Lymphatic: Advanced aortic atherosclerosis. Atherosclerosis of the aortic branch vessels. IVC is normal. No retroperitoneal adenopathy. Reproductive: Previous hysterectomy.  No pelvic mass. Other: No free fluid or air. Musculoskeletal: Chronic lumbar degenerative changes. No acute bone finding. IMPRESSION: Segmental consolidation in the left lower lobe consistent with bronchopneumonia. Small amount of left pleural fluid. Sludge or gallstones in the gallbladder. Aortic atherosclerosis. No acute bowel pathology seen. No obstruction or inflammatory finding. Electronically Signed   By: Nelson Chimes M.D.   On: 11/02/2017 18:28   Dg Tibia/fibula Right  Result Date: 11/02/2017 CLINICAL DATA:  Right lower leg ulcers. EXAM: RIGHT TIBIA AND FIBULA - 2 VIEW COMPARISON:  None. FINDINGS: There is no evidence of fracture or other focal bone lesions. Surgical screw is seen in distal right fibula. Vascular calcifications are noted. No lytic destruction is seen to suggest osteomyelitis. IMPRESSION: No fracture or dislocation is noted. No definite evidence of osteomyelitis. Electronically Signed   By: Marijo Conception, M.D.   On: 11/02/2017 17:30   Dg Chest Port 1 View  Result Date: 11/02/2017 CLINICAL DATA:  Shortness of breath and cough. EXAM: PORTABLE CHEST 1 VIEW COMPARISON:  August 13, 2017 FINDINGS: Stable cardiomegaly. Mild edema. No other interval changes or acute abnormalities. IMPRESSION: Cardiomegaly and mild edema. Electronically Signed   By: Dorise Bullion III M.D   On: 11/02/2017 19:11   US Breast Ltd Uni Right Inc Axilla  Result Date: 10/13/2017 CLINICAL DATA:  Screening recall for right axillary mass. Patient reports she has a "blackhead" in this location. EXAM: 2D DIGITAL DIAGNOSTIC UNILATERAL RIGHT MAMMOGRAM WITH CAD AND ADJUNCT TOMO ULTRASOUND RIGHT AXILLA COMPARISON:  Previous exam(s). ACR Breast  Density Category b: There are scattered areas of fibroglandular density. FINDINGS: Spot compression MLO tomograms were performed of the right axilla demonstrating a large oval circumscribed lobulated mass measuring up to 2.9 cm located very close to the skin surface. Mammographic images were processed with CAD. Physical examination of the right axilla reveals numerous, approximately 6-8, nodules in the skin containing central black pores compatible with sebaceous cysts. Targeted ultrasound of the right axilla was performed demonstrating an oval circumscribed hypoechoic mass with posterior acoustic enhancement within the skin measuring 1.3 x 0.9 x 1.3 cm. An adjacent similar appearing nodule measures 1.4 x 0.5 x 1.4 cm. Findings are most compatible with benign sebaceous cyst. IMPRESSION: Multiple sebaceous cysts in the right axilla. There are no findings of malignancy in the right breast. RECOMMENDATION: 1. Clinical follow-up as needed for multiple sebaceous cysts in the right axilla. 2. Recommend annual routine screening mammography, due December 2019. I have discussed the findings and recommendations with the patient. Results were also provided in writing at the conclusion of the visit. If applicable, a reminder letter will be sent to the patient regarding the next appointment. BI-RADS CATEGORY  2: Benign. Electronically Signed   By: Anderson Malta  Shelly Bombard M.D.   On: 10/13/2017 11:13   Mm Diag Breast Tomo Uni Right  Result Date: 10/13/2017 CLINICAL DATA:  Screening recall for right axillary mass. Patient reports she has a "blackhead" in this location. EXAM: 2D DIGITAL DIAGNOSTIC UNILATERAL RIGHT MAMMOGRAM WITH CAD AND ADJUNCT TOMO ULTRASOUND RIGHT AXILLA COMPARISON:  Previous exam(s). ACR Breast Density Category b: There are scattered areas of fibroglandular density. FINDINGS: Spot compression MLO tomograms were performed of the right axilla demonstrating a large oval circumscribed lobulated mass measuring up to 2.9  cm located very close to the skin surface. Mammographic images were processed with CAD. Physical examination of the right axilla reveals numerous, approximately 6-8, nodules in the skin containing central black pores compatible with sebaceous cysts. Targeted ultrasound of the right axilla was performed demonstrating an oval circumscribed hypoechoic mass with posterior acoustic enhancement within the skin measuring 1.3 x 0.9 x 1.3 cm. An adjacent similar appearing nodule measures 1.4 x 0.5 x 1.4 cm. Findings are most compatible with benign sebaceous cyst. IMPRESSION: Multiple sebaceous cysts in the right axilla. There are no findings of malignancy in the right breast. RECOMMENDATION: 1. Clinical follow-up as needed for multiple sebaceous cysts in the right axilla. 2. Recommend annual routine screening mammography, due December 2019. I have discussed the findings and recommendations with the patient. Results were also provided in writing at the conclusion of the visit. If applicable, a reminder letter will be sent to the patient regarding the next appointment. BI-RADS CATEGORY  2: Benign. Electronically Signed   By: Everlean Alstrom M.D.   On: 10/13/2017 11:13    Orson Eva, DO  [facility] Pager [phone]  If 7PM-7AM, please contact night-coverage [email or web address] Password [email or web address] 11/03/2017, 7:21 AM   LOS: 1 day

## 2017-11-03 NOTE — Consult Note (Signed)
Reason for Consult: Chronic renal failure Referring Physician: Dr. Francetta Found is an 72 y.o. female.  HPI: She is a patient who has history of diabetes, lupus, COPD, chronic renal failure stage IV presently came with complaints of cough, nonproductive.  This is also associated with some chills.  She has also some abdominal pain with nausea but no vomiting.  Patient has some difficulty in breathing.  She has also exertional dyspnea for some time.  Patient is states that she is feeling much better this morning.  She does not have any more of nausea or vomiting.  Denies also any diarrhea.  Past Medical History:  Diagnosis Date  . Anemia   . Arthritis   . Asthma   . Atrial fibrillation ([place])   . Atrial flutter [facility])    TEE/DCCV December 2013 - on Xarelto [facility])  . Bone spur of toe    Right 5th toe  . Chronic diastolic CHF (congestive heart failure) (HCC)    a. EF 50-55% by echo in 2015  . CKD (chronic kidney disease) stage 4, GFR 15-29 ml/min (HCC)    a. s/p fistula placement but not on HD. Followed by Nephrology.   Marland Kitchen COPD (chronic obstructive pulmonary disease) ([place])   . DVT of upper extremity (deep vein thrombosis) ([place])    Right basilic vein, June 5320  . Essential hypertension, benign   . Fractures   . SLE (systemic lupus erythematosus) ([place])   . Type 2 diabetes mellitus ([place])   . UTI (lower urinary tract infection)     Past Surgical History:  Procedure Laterality Date  . ABDOMINAL HYSTERECTOMY  1983  . ANKLE SURGERY  1993  . AV FISTULA PLACEMENT Left 03/27/2014   Procedure: ARTERIOVENOUS (AV) FISTULA CREATION;  Surgeon: Rosetta Posner, MD;  Location: [facility];  Service: Vascular;  Laterality: Left;  . BACK SURGERY  1980  . CARDIOVASCULAR STRESS TEST  12/19/2009   no stress induced rhythm abnormalities, ekg negative for ischemia  . CARDIOVERSION  09/16/2012   Procedure: CARDIOVERSION;  Surgeon: Sanda Klein, MD;  Location: [facility] ENDOSCOPY;  Service: Cardiovascular;   Laterality: N/A;  . COLONOSCOPY  09/20/2012   Dr. [name]: multiple tubular adenomas   . COLONOSCOPY  2005   Dr. [name]. Polyps, path unknown   . COLONOSCOPY N/A 07/24/2016   Procedure: COLONOSCOPY;  Surgeon: [name], MD;  [facility];  Service: Endoscopy;  Laterality: N/A;  9:00 am  . ESOPHAGOGASTRODUODENOSCOPY  09/20/2012   Dr. [name]: duodenal erosion and possible resolving ulcer at the angularis   . TEE WITHOUT CARDIOVERSION  09/16/2012   Procedure: TRANSESOPHAGEAL ECHOCARDIOGRAM (TEE);  Surgeon: Sanda Klein, MD;  Location: [facility] ENDOSCOPY;  Service: Cardiovascular;  Laterality: N/A;  . TRANSESOPHAGEAL ECHOCARDIOGRAM WITH CARDIOVERSION  09/16/2012   EF 60-65%, moderate LVH, moderate regurg of the aortic valve, LA moderately dilated    Family History  Problem Relation Age of Onset  . Diabetes Brother   . Diabetes Father   . Hypertension Father   . Hypertension Sister   . Stroke Mother   . Diabetes Sister   . Hypertension Brother   . Colon cancer Neg Hx     Social History:  reports that she quit smoking about 5 years ago. Her smoking use included cigarettes. She has a 30.00 pack-year smoking history. she has never used smokeless tobacco. She reports that she does not drink alcohol or use drugs.  Allergies:  Allergies  Allergen Reactions  . Ace  Inhibitors Cough    Medications: I have reviewed the patient's current medications.  Results for orders placed or performed during the hospital encounter of 11/02/17 (from the past 48 hour(s))  CBG monitoring, ED     Status: Abnormal   Collection Time: 11/02/17  2:26 PM  Result Value Ref Range   Glucose-Capillary 49 (L) 65 - 99 mg/dL  CBG monitoring, ED     Status: Abnormal   Collection Time: 11/02/17  2:33 PM  Result Value Ref Range   Glucose-Capillary 53 (L) 65 - 99 mg/dL  Urinalysis, Routine w reflex microscopic     Status: Abnormal   Collection Time: 11/02/17  3:27 PM  Result Value Ref Range   Color,  Urine YELLOW YELLOW   APPearance CLOUDY (A) CLEAR   Specific Gravity, Urine 1.010 1.005 - 1.030   pH 8.0 5.0 - 8.0   Glucose, UA NEGATIVE NEGATIVE mg/dL   Hgb urine dipstick NEGATIVE NEGATIVE   Bilirubin Urine NEGATIVE NEGATIVE   Ketones, ur NEGATIVE NEGATIVE mg/dL   Protein, ur 100 (A) NEGATIVE mg/dL   Nitrite NEGATIVE NEGATIVE   Leukocytes, UA LARGE (A) NEGATIVE   RBC / HPF 0-5 0 - 5 RBC/hpf   WBC, UA TOO NUMEROUS TO COUNT 0 - 5 WBC/hpf   Bacteria, UA NONE SEEN NONE SEEN   Squamous Epithelial / LPF 0-5 (A) NONE SEEN   Mucus PRESENT    Hyaline Casts, UA PRESENT    Non Squamous Epithelial 6-30 (A) NONE SEEN    Comment: Performed at [facility], [address] [place], [place] [address]  CBC with Differential/Platelet     Status: Abnormal   Collection Time: 11/02/17  3:36 PM  Result Value Ref Range   WBC 17.7 (H) 4.0 - 10.5 K/uL   RBC 3.68 (L) 3.87 - 5.11 MIL/uL   Hemoglobin 10.1 (L) 12.0 - 15.0 g/dL   HCT 32.2 (L) 36.0 - 46.0 %   MCV 87.5 78.0 - 100.0 fL   MCH 27.4 26.0 - 34.0 pg   MCHC 31.4 30.0 - 36.0 g/dL   RDW 18.2 (H) 11.5 - 15.5 %   Platelets 262 150 - 400 K/uL   Neutrophils Relative % 92 %   Neutro Abs 16.3 (H) 1.7 - 7.7 K/uL   Lymphocytes Relative 5 %   Lymphs Abs 0.9 0.7 - 4.0 K/uL   Monocytes Relative 3 %   Monocytes Absolute 0.6 0.1 - 1.0 K/uL   Eosinophils Relative 0 %   Eosinophils Absolute 0.0 0.0 - 0.7 K/uL   Basophils Relative 0 %   Basophils Absolute 0.0 0.0 - 0.1 K/uL    Comment: Performed at [facility], [address], [place], [place] [address]  Comprehensive metabolic panel     Status: Abnormal   Collection Time: 11/02/17  3:36 PM  Result Value Ref Range   Sodium 136 135 - 145 mmol/L   Potassium 3.9 3.5 - 5.1 mmol/L   Chloride 105 101 - 111 mmol/L   CO2 18 (L) 22 - 32 mmol/L   Glucose, Bld 70 65 - 99 mg/dL   BUN 90 (H) 6 - 20 mg/dL   Creatinine, Ser 4.00 (H) 0.44 - 1.00 mg/dL   Calcium 8.8 (L) 8.9 - 10.3 mg/dL   Total Protein 7.4 6.5 - 8.1  g/dL   Albumin 2.3 (L) 3.5 - 5.0 g/dL   AST 37 15 - 41 U/L   ALT 27 14 - 54 U/L   Alkaline Phosphatase 108 38 - 126  U/L   Total Bilirubin 0.6 0.3 - 1.2 mg/dL   GFR calc non Af Amer 10 (L) >60 mL/min   GFR calc Af Amer 12 (L) >60 mL/min    Comment: (NOTE) The eGFR has been calculated using the CKD EPI equation. This calculation has not been validated in all clinical situations. eGFR's persistently <60 mL/min signify possible Chronic Kidney Disease.    Anion gap 13 5 - 15    Comment: Performed at [facility], [address] [place], [place] [address]  Troponin I     Status: Abnormal   Collection Time: 11/02/17  3:36 PM  Result Value Ref Range   Troponin I 0.09 (HH) <0.03 ng/mL    Comment: CRITICAL RESULT CALLED TO, READ BACK BY AND VERIFIED WITH: DOSS,M ON 11/02/17 AT 1945 BY LOY,C Performed at [facility], [address] [place], [place] [address]   Lipase, blood     Status: None   Collection Time: 11/02/17  3:36 PM  Result Value Ref Range   Lipase 20 11 - 51 U/L    Comment: Performed at [facility], [address] [place], [place] [address]  Protime-INR     Status: Abnormal   Collection Time: 11/02/17  3:37 PM  Result Value Ref Range   Prothrombin Time 24.3 (H) 11.4 - 15.2 seconds   INR 2.21     Comment: Performed at [facility], [address] [place], [place] [address]  CBG monitoring, ED     Status: Abnormal   Collection Time: 11/02/17  4:50 PM  Result Value Ref Range   Glucose-Capillary 190 (H) 65 - 99 mg/dL  CBG monitoring, ED     Status: Abnormal   Collection Time: 11/02/17  6:43 PM  Result Value Ref Range   Glucose-Capillary 163 (H) 65 - 99 mg/dL  Culture, blood (Routine X 2) w Reflex to ID Panel     Status: None (Preliminary result)   Collection Time: 11/02/17  8:11 PM  Result Value Ref Range   Specimen Description BLOOD RIGHT FOREARM    Special Requests      BOTTLES DRAWN AEROBIC ONLY Blood Culture adequate volume   Culture      NO GROWTH < 12  HOURS Performed at [facility], [address], [place], [place] [address]    Report Status PENDING   Culture, blood (Routine X 2) w Reflex to ID Panel     Status: None (Preliminary result)   Collection Time: 11/02/17  8:11 PM  Result Value Ref Range   Specimen Description BLOOD RIGHT WRIST    Special Requests      BOTTLES DRAWN AEROBIC ONLY Blood Culture adequate volume   Culture      NO GROWTH < 12 HOURS Performed at [facility], [address], [place], [place] [address]    Report Status PENDING   Troponin I     Status: Abnormal   Collection Time: 11/02/17  8:11 PM  Result Value Ref Range   Troponin I 0.08 (HH) <0.03 ng/mL    Comment: CRITICAL VALUE NOTED.  VALUE IS CONSISTENT WITH PREVIOUSLY REPORTED AND CALLED VALUE. Performed at [facility], [address] [place], [place] [address]   CBG monitoring, ED     Status: Abnormal   Collection Time: 11/02/17 11:12 PM  Result Value Ref Range   Glucose-Capillary 147 (H) 65 - 99 mg/dL  Glucose, capillary     Status: Abnormal   Collection Time: 11/03/17 12:44 AM  Result Value Ref Range  Glucose-Capillary 162 (H) 65 - 99 mg/dL   Comment 1 Notify RN    Comment 2 Document in Chart   Basic metabolic panel     Status: Abnormal   Collection Time: 11/03/17  2:08 AM  Result Value Ref Range   Sodium 134 (L) 135 - 145 mmol/L   Potassium 4.2 3.5 - 5.1 mmol/L   Chloride 103 101 - 111 mmol/L   CO2 18 (L) 22 - 32 mmol/L   Glucose, Bld 235 (H) 65 - 99 mg/dL   BUN 89 (H) 6 - 20 mg/dL   Creatinine, Ser 3.97 (H) 0.44 - 1.00 mg/dL   Calcium 8.6 (L) 8.9 - 10.3 mg/dL   GFR calc non Af Amer 10 (L) >60 mL/min   GFR calc Af Amer 12 (L) >60 mL/min    Comment: (NOTE) The eGFR has been calculated using the CKD EPI equation. This calculation has not been validated in all clinical situations. eGFR's persistently <60 mL/min signify possible Chronic Kidney Disease.    Anion gap 13 5 - 15    Comment: Performed at [facility], [address],  [place], [place] [address]  CBC     Status: Abnormal   Collection Time: 11/03/17  2:08 AM  Result Value Ref Range   WBC 18.2 (H) 4.0 - 10.5 K/uL   RBC 3.21 (L) 3.87 - 5.11 MIL/uL   Hemoglobin 8.8 (L) 12.0 - 15.0 g/dL   HCT 27.9 (L) 36.0 - 46.0 %   MCV 86.9 78.0 - 100.0 fL   MCH 27.4 26.0 - 34.0 pg   MCHC 31.5 30.0 - 36.0 g/dL   RDW 18.1 (H) 11.5 - 15.5 %   Platelets 279 150 - 400 K/uL    Comment: Performed at [facility], [address], [place], [place] [address]  Protime-INR     Status: Abnormal   Collection Time: 11/03/17  2:08 AM  Result Value Ref Range   Prothrombin Time 27.3 (H) 11.4 - 15.2 seconds   INR 2.56     Comment: Performed at [facility], [address] [place], [place] [address]  Troponin I     Status: Abnormal   Collection Time: 11/03/17  2:08 AM  Result Value Ref Range   Troponin I 0.09 (HH) <0.03 ng/mL    Comment: CRITICAL RESULT CALLED TO, READ BACK BY AND VERIFIED WITH: THOMAS,C @ 0325 ON 2.5.19 BY BOWMAN,L Performed at [facility], [address] [place], [place] [address]   Procalcitonin - Baseline     Status: None   Collection Time: 11/03/17  2:08 AM  Result Value Ref Range   Procalcitonin 20.71 ng/mL    Comment:        Interpretation: PCT >= 10 ng/mL: Important systemic inflammatory response, almost exclusively due to severe bacterial sepsis or septic shock. (NOTE)       Sepsis PCT Algorithm           Lower Respiratory Tract                                      Infection PCT Algorithm    ----------------------------     ----------------------------         PCT < 0.25 ng/mL                PCT < 0.10 ng/mL         Strongly encourage  Strongly discourage   discontinuation of antibiotics    initiation of antibiotics    ----------------------------     -----------------------------       PCT 0.25 - 0.50 ng/mL            PCT 0.10 - 0.25 ng/mL               OR       >80% decrease in PCT            Discourage initiation of                                             antibiotics      Encourage discontinuation           of antibiotics    ----------------------------     -----------------------------         PCT >= 0.50 ng/mL              PCT 0.26 - 0.50 ng/mL                AND       <80% decrease in PCT             Encourage initiation of                                             antibiotics       Encourage continuation           of antibiotics    ----------------------------     -----------------------------        PCT >= 0.50 ng/mL                  PCT > 0.50 ng/mL               AND         increase in PCT                  Strongly encourage                                      initiation of antibiotics    Strongly encourage escalation           of antibiotics                                     -----------------------------                                           PCT <= 0.25 ng/mL                                                 OR                                        >  80% decrease in PCT                                     Discontinue / Do not initiate                                             antibiotics Performed at [facility], [address] [place], [place] [address]   Glucose, capillary     Status: Abnormal   Collection Time: 11/03/17  6:12 AM  Result Value Ref Range   Glucose-Capillary 207 (H) 65 - 99 mg/dL  Glucose, capillary     Status: Abnormal   Collection Time: 11/03/17  7:24 AM  Result Value Ref Range   Glucose-Capillary 189 (H) 65 - 99 mg/dL   Comment 1 Notify RN    Comment 2 Document in Chart   Hemoglobin and hematocrit, blood     Status: Abnormal   Collection Time: 11/03/17  8:01 AM  Result Value Ref Range   Hemoglobin 8.5 (L) 12.0 - 15.0 g/dL   HCT 27.6 (L) 36.0 - 46.0 %    Comment: Performed at [facility], [address], [place], [place] [address]    Ct Abdomen Pelvis Wo Contrast  Result Date: 11/02/2017 CLINICAL DATA:  Emesis for the last 3 days.  Intermittent fever. EXAM: CT  ABDOMEN AND PELVIS WITHOUT CONTRAST TECHNIQUE: Multidetector CT imaging of the abdomen and pelvis was performed following the standard protocol without IV contrast. COMPARISON:  09/09/2017.  08/31/2017. FINDINGS: Lower chest: There is a small left effusion. There is segmental consolidation in the left lower lobe suggesting pneumonia. The heart is mildly enlarged. Hepatobiliary: No liver parenchymal abnormality is seen. There is some layering sludge or stones in the gallbladder. No CT evidence of cholecystitis. Pancreas: Normal Spleen: Normal Adrenals/Urinary Tract: Adrenal glands are normal. Kidneys show vascular calcifications but no acute finding. Bladder appears normal. Stomach/Bowel: No acute bowel pathology is seen. No sign of obstruction or inflammatory disease. Vascular/Lymphatic: Advanced aortic atherosclerosis. Atherosclerosis of the aortic branch vessels. IVC is normal. No retroperitoneal adenopathy. Reproductive: Previous hysterectomy.  No pelvic mass. Other: No free fluid or air. Musculoskeletal: Chronic lumbar degenerative changes. No acute bone finding. IMPRESSION: Segmental consolidation in the left lower lobe consistent with bronchopneumonia. Small amount of left pleural fluid. Sludge or gallstones in the gallbladder. Aortic atherosclerosis. No acute bowel pathology seen. No obstruction or inflammatory finding. Electronically Signed   By: Nelson Chimes M.D.   On: 11/02/2017 18:28   Dg Tibia/fibula Right  Result Date: 11/02/2017 CLINICAL DATA:  Right lower leg ulcers. EXAM: RIGHT TIBIA AND FIBULA - 2 VIEW COMPARISON:  None. FINDINGS: There is no evidence of fracture or other focal bone lesions. Surgical screw is seen in distal right fibula. Vascular calcifications are noted. No lytic destruction is seen to suggest osteomyelitis. IMPRESSION: No fracture or dislocation is noted. No definite evidence of osteomyelitis. Electronically Signed   By: Marijo Conception, M.D.   On: 11/02/2017 17:30   Dg Chest  Port 1 View  Result Date: 11/03/2017 CLINICAL DATA:  Respiratory failure EXAM: PORTABLE CHEST 1 VIEW COMPARISON:  November 12, 2017 FINDINGS: There is stable cardiomegaly with mild pulmonary venous hypertension. There is atelectatic change in the left base. No frank edema or consolidation evident. There is aortic  atherosclerosis. No bone lesions appreciable. IMPRESSION: Evidence of a degree of pulmonary vascular congestion without frank edema or consolidation. There is left base atelectasis. There is aortic atherosclerosis. Aortic Atherosclerosis (ICD10-I70.0). Electronically Signed   By: Lowella Grip III M.D.   On: 11/03/2017 08:37   Dg Chest Port 1 View  Result Date: 11/02/2017 CLINICAL DATA:  Shortness of breath and cough. EXAM: PORTABLE CHEST 1 VIEW COMPARISON:  August 13, 2017 FINDINGS: Stable cardiomegaly. Mild edema. No other interval changes or acute abnormalities. IMPRESSION: Cardiomegaly and mild edema. Electronically Signed   By: Dorise Bullion III M.D   On: 11/02/2017 19:11    Review of Systems  Constitutional: Positive for fever and malaise/fatigue.  HENT: Positive for congestion.   Respiratory: Positive for cough and shortness of breath. Negative for sputum production.   Cardiovascular: Negative for chest pain.       She has exertional dyspnea having difficulty going again coming back from the bathroom  Gastrointestinal: Positive for nausea. Negative for vomiting.       Her appetite is poor  Neurological: Positive for weakness.   Blood pressure (!) 148/65, pulse 83, temperature 99.6 F (37.6 C), temperature source Oral, resp. rate 17, height '5\' 4"'  (1.626 m), weight 83.9 kg (184 lb 15.5 oz), SpO2 98 %. Physical Exam  Constitutional: She is oriented to person, place, and time. No distress.  Eyes: No scleral icterus.  Neck: No JVD present.  Cardiovascular: Normal rate and regular rhythm.  Respiratory: No respiratory distress. She has no wheezes. She has rales.  GI: She  exhibits no distension. There is no tenderness.  Musculoskeletal: She exhibits edema.  Neurological: She is alert and oriented to person, place, and time.  Skin:  She has dressing on the lateral side of her left lower leg bilaterally.  She has some erythema    Assessment/Plan: 1] chronic renal failure: Patient with history of collapsing focal segmental nephrosclerosis.  Presently her BUN and creatinine stable.  She is a stage IV.  Her creatinine was 2.18 about 2 weeks ago.  At this moment patient might have possibly superimposed acute renal failure.  Patient was previously sent for fistula placement but she has never gone.    Patient does not remember when was the last time she was seen by nephrologist.   2] history of difficulty breathing, cough.  Patient with history of diastolic dysfunction.  She has been on diuretics before.  She is on Lasix.  Chest x-ray with cardiomegaly and mild edema 3] history of lupus: No recent history of activation 4] anemia: Thought to be secondary to chronic disease including renal failure.  She has been on erythropoietin for some time.  Her hemoglobin is presently below our target goal. 5] bone and mineral disorder: Her calcium and phosphorus is range 6] diabetes 7] hypertension: Her blood pressure is reasonably controlled Plan: We will start patient on Lasix 120 mg IV twice daily 2] will check a renal panel, iron studies, CBC in the morning 3] we will follow patient and possibly she may need to have a fistula placed for future dialysis. 4] if her renal function continued to decline we will consider initiating hemodialysis.  [name] S 11/03/2017, 9:16 AM

## 2017-11-03 NOTE — Plan of Care (Signed)
Notified respiratory, patient wheezing and short of breath. Requested nebulizer treatment for patient.

## 2017-11-03 NOTE — Plan of Care (Signed)
Notified MD, patient is exhibiting tachypnea, is short of breath and complains of difficulty breathing. She is wheezing and has received 80 mg of iv Lasix, antibiotics given. Awaiting response from MD.

## 2017-11-03 NOTE — Plan of Care (Signed)
MD at bedside. Assessing patient.

## 2017-11-04 ENCOUNTER — Other Ambulatory Visit (HOSPITAL_COMMUNITY): Payer: Medicare Other

## 2017-11-04 ENCOUNTER — Ambulatory Visit (HOSPITAL_COMMUNITY): Payer: Medicare Other

## 2017-11-04 ENCOUNTER — Ambulatory Visit (HOSPITAL_COMMUNITY): Payer: Medicare Other | Admitting: Internal Medicine

## 2017-11-04 DIAGNOSIS — N179 Acute kidney failure, unspecified: Secondary | ICD-10-CM

## 2017-11-04 DIAGNOSIS — J9601 Acute respiratory failure with hypoxia: Secondary | ICD-10-CM

## 2017-11-04 DIAGNOSIS — N184 Chronic kidney disease, stage 4 (severe): Secondary | ICD-10-CM

## 2017-11-04 DIAGNOSIS — J181 Lobar pneumonia, unspecified organism: Secondary | ICD-10-CM

## 2017-11-04 DIAGNOSIS — D631 Anemia in chronic kidney disease: Secondary | ICD-10-CM

## 2017-11-04 LAB — CBC
HEMATOCRIT: 32.1 % — AB (ref 36.0–46.0)
Hemoglobin: 10.1 g/dL — ABNORMAL LOW (ref 12.0–15.0)
MCH: 27.6 pg (ref 26.0–34.0)
MCHC: 31.5 g/dL (ref 30.0–36.0)
MCV: 87.7 fL (ref 78.0–100.0)
PLATELETS: 272 10*3/uL (ref 150–400)
RBC: 3.66 MIL/uL — ABNORMAL LOW (ref 3.87–5.11)
RDW: 18.3 % — AB (ref 11.5–15.5)
WBC: 10.8 10*3/uL — AB (ref 4.0–10.5)

## 2017-11-04 LAB — RENAL FUNCTION PANEL
Albumin: 2.1 g/dL — ABNORMAL LOW (ref 3.5–5.0)
Anion gap: 15 (ref 5–15)
BUN: 100 mg/dL — AB (ref 6–20)
CHLORIDE: 101 mmol/L (ref 101–111)
CO2: 18 mmol/L — ABNORMAL LOW (ref 22–32)
CREATININE: 4.4 mg/dL — AB (ref 0.44–1.00)
Calcium: 8.8 mg/dL — ABNORMAL LOW (ref 8.9–10.3)
GFR, EST AFRICAN AMERICAN: 11 mL/min — AB (ref >60)
GFR, EST NON AFRICAN AMERICAN: 9 mL/min — AB (ref >60)
Glucose, Bld: 235 mg/dL — ABNORMAL HIGH (ref 65–99)
POTASSIUM: 4.4 mmol/L (ref 3.5–5.1)
Phosphorus: 7.5 mg/dL — ABNORMAL HIGH (ref 2.5–4.6)
Sodium: 134 mmol/L — ABNORMAL LOW (ref 135–145)

## 2017-11-04 LAB — GLUCOSE, CAPILLARY
GLUCOSE-CAPILLARY: 258 mg/dL — AB (ref 65–99)
GLUCOSE-CAPILLARY: 330 mg/dL — AB (ref 65–99)
GLUCOSE-CAPILLARY: 333 mg/dL — AB (ref 65–99)
Glucose-Capillary: 174 mg/dL — ABNORMAL HIGH (ref 65–99)
Glucose-Capillary: 253 mg/dL — ABNORMAL HIGH (ref 65–99)
Glucose-Capillary: 288 mg/dL — ABNORMAL HIGH (ref 65–99)
Glucose-Capillary: 390 mg/dL — ABNORMAL HIGH (ref 65–99)

## 2017-11-04 LAB — IRON AND TIBC
IRON: 12 ug/dL — AB (ref 28–170)
SATURATION RATIOS: 12 % (ref 10.4–31.8)
TIBC: 104 ug/dL — ABNORMAL LOW (ref 250–450)
UIBC: 92 ug/dL

## 2017-11-04 LAB — PROTIME-INR
INR: 4.74
Prothrombin Time: 44.1 seconds — ABNORMAL HIGH (ref 11.4–15.2)

## 2017-11-04 LAB — FERRITIN: FERRITIN: 752 ng/mL — AB (ref 11–307)

## 2017-11-04 MED ORDER — CALCIUM ACETATE (PHOS BINDER) 667 MG PO CAPS
667.0000 mg | ORAL_CAPSULE | Freq: Three times a day (TID) | ORAL | Status: DC
  Administered 2017-11-04 – 2017-11-05 (×3): 667 mg via ORAL
  Filled 2017-11-04 (×3): qty 1

## 2017-11-04 MED ORDER — IPRATROPIUM-ALBUTEROL 0.5-2.5 (3) MG/3ML IN SOLN
3.0000 mL | Freq: Three times a day (TID) | RESPIRATORY_TRACT | Status: DC
  Administered 2017-11-05 – 2017-11-11 (×17): 3 mL via RESPIRATORY_TRACT
  Filled 2017-11-04 (×18): qty 3

## 2017-11-04 MED ORDER — SODIUM BICARBONATE 650 MG PO TABS
650.0000 mg | ORAL_TABLET | Freq: Three times a day (TID) | ORAL | Status: DC
  Administered 2017-11-04 – 2017-11-12 (×25): 650 mg via ORAL
  Filled 2017-11-04 (×28): qty 1

## 2017-11-04 MED ORDER — INSULIN DETEMIR 100 UNIT/ML ~~LOC~~ SOLN
10.0000 [IU] | Freq: Every day | SUBCUTANEOUS | Status: DC
  Administered 2017-11-04 – 2017-11-10 (×6): 10 [IU] via SUBCUTANEOUS
  Filled 2017-11-04 (×8): qty 0.1

## 2017-11-04 NOTE — Progress Notes (Signed)
CRITICAL VALUE ALERT  Critical Value:  4.74 INR  Date & Time Notied:  11/04/16 @ 0715  Provider Notified: Dr, Jerilee Hoh  Orders Received/Actions taken: Waiting for call bck/orders

## 2017-11-04 NOTE — Progress Notes (Addendum)
ANTICOAGULATION CONSULT NOTE - follow up  Pharmacy Consult for Warfarin (home med) Indication: atrial fibrillation  Allergies  Allergen Reactions  . Ace Inhibitors Cough   Patient Measurements: Height: 5\' 4"  (162.6 cm) Weight: 186 lb 4.6 oz (84.5 kg) IBW/kg (Calculated) : 54.7  Vital Signs: Temp: 97.6 F (36.4 C) (02/06 0546) Temp Source: Oral (02/06 0546) BP: 126/60 (02/06 0546) Pulse Rate: 92 (02/06 0546)  Labs: Recent Labs    11/02/17 1536 11/02/17 1537  11/03/17 0208 11/03/17 0801 11/03/17 1432 11/04/17 0442  HGB 10.1*  --   --  8.8* 8.5*  --  10.1*  HCT 32.2*  --   --  27.9* 27.6*  --  32.1*  PLT 262  --   --  279  --   --  272  LABPROT  --  24.3*  --  27.3*  --   --  44.1*  INR  --  2.21  --  2.56  --   --  4.74*  CREATININE 4.00*  --   --  3.97*  --   --  4.40*  TROPONINI 0.09*  --    < > 0.09* 0.09* 0.08*  --    < > = values in this interval not displayed.   Estimated Creatinine Clearance: 12.3 mL/min (A) (by C-G formula based on SCr of 4.4 mg/dL (H)).  Medical History: Past Medical History:  Diagnosis Date  . Anemia   . Arthritis   . Asthma   . Atrial fibrillation ([place])   . Atrial flutter [facility])    TEE/DCCV December 2013 - on Xarelto [facility])  . Bone spur of toe    Right 5th toe  . Chronic diastolic CHF (congestive heart failure) (HCC)    a. EF 50-55% by echo in 2015  . CKD (chronic kidney disease) stage 4, GFR 15-29 ml/min (HCC)    a. s/p fistula placement but not on HD. Followed by Nephrology.   Marland Kitchen COPD (chronic obstructive pulmonary disease) ([place])   . DVT of upper extremity (deep vein thrombosis) ([place])    Right basilic vein, June 7672  . Essential hypertension, benign   . Fractures   . SLE (systemic lupus erythematosus) ([place])   . Type 2 diabetes mellitus ([place])   . UTI (lower urinary tract infection)    Medications:  Medications Prior to Admission  Medication Sig Dispense Refill Last Dose  . acetaminophen (TYLENOL) 500 MG tablet Take 1,000 mg  by mouth every 6 (six) hours as needed. For pain   Taking  . albuterol (ACCUNEB) 0.63 MG/3ML nebulizer solution Take 1 ampule by nebulization every 6 (six) hours as needed for wheezing or shortness of breath.    11/01/2017 at Unknown time  . albuterol (PROVENTIL HFA;VENTOLIN HFA) 108 (90 BASE) MCG/ACT inhaler Inhale 2 puffs into the lungs every 6 (six) hours as needed. For shortness of breath   11/01/2017 at Unknown time  . amLODipine (NORVASC) 10 MG tablet Take 1 tablet (10 mg total) by mouth at bedtime. 30 tablet 0 11/02/2017 at Unknown time  . atorvastatin (LIPITOR) 40 MG tablet Take 40 mg at bedtime by mouth.    11/01/2017 at Unknown time  . brimonidine-timolol (COMBIGAN) 0.2-0.5 % ophthalmic solution Place 1 drop into the left eye daily.    11/01/2017 at Unknown time  . calcitRIOL (ROCALTROL) 0.25 MCG capsule Take 0.25 mcg by mouth as directed. Take on Mondays Wednesdays and Fridays   11/02/2017 at Unknown time  . diclofenac (VOLTAREN) 0.1 % ophthalmic solution  Place 1 drop into the left eye daily. Uses at lunch time   11/01/2017 at Unknown time  . dicyclomine (BENTYL) 10 MG capsule Take 10 mg by mouth 4 (four) times daily -  before meals and at bedtime.   11/02/2017 at Unknown time  . febuxostat (ULORIC) 40 MG tablet Take 40 mg by mouth daily.   11/02/2017 at Unknown time  . folic acid (FOLVITE) 1 MG tablet Take 1 mg by mouth daily.   11/02/2017 at Unknown time  . furosemide (LASIX) 80 MG tablet Take 160 mg by mouth 2 (two) times daily.    11/02/2017 at Unknown time  . HUMALOG KWIKPEN 100 UNIT/ML KiwkPen INJECT UP TO 11 UNITS SUBCUTANEOUSLY BEFORE MEALS THREE TIMES DAILY. 15 mL 2 11/02/2017 at Unknown time  . HYDROcodone-acetaminophen (NORCO/VICODIN) 5-325 MG tablet 1 or 2 tabs PO q12 hours prn pain 10 tablet 0 11/01/2017 at Unknown time  . hydroxychloroquine (PLAQUENIL) 200 MG tablet Take 200 mg by mouth as directed. Take 1 1/2 tablets daily   11/02/2017 at 900  . Insulin Detemir (LEVEMIR FLEXTOUCH) 100 UNIT/ML Pen Inject  10 Units into the skin daily at 10 pm. 5 pen 2 11/01/2017 at Unknown time  . metoprolol tartrate (LOPRESSOR) 50 MG tablet TAKE ONE TABLET BY MOUTH TWICE DAILY. 60 tablet 6 11/02/2017 at 900  . Multiple Vitamins-Minerals (CENTRUM SILVER PO) Take 1 tablet by mouth daily.   11/02/2017 at Unknown time  . ONE TOUCH ULTRA TEST test strip TEST 4 TIMES A DAY AS DIRECTED. 150 each 5 11/02/2017 at Unknown time  . ONETOUCH DELICA LANCETS 68T MISC USE TO TEST 4 TIMES DAILY. 100 each 5 11/02/2017 at Unknown time  . predniSONE (DELTASONE) 5 MG tablet Take 5 mg by mouth daily with breakfast.   11/02/2017 at Unknown time  . travoprost, benzalkonium, (TRAVATAN) 0.004 % ophthalmic solution Place 1 drop into the left eye at bedtime.   11/01/2017 at Unknown time  . UNIFINE PENTIPS 31G X 6 MM MISC USE AS DIRECTED AT BEDTIME WITH LEVEMIR AND WITH SLIDING SCALE INSULIN. 150 each 5 11/02/2017 at Unknown time  . Vitamin D, Ergocalciferol, (DRISDOL) 50000 units CAPS capsule TAKE 1 CAPSULE BY MOUTH ONCE WEEKLY. 12 capsule 0 Past Week at Unknown time  . warfarin (COUMADIN) 5 MG tablet Take 1 tablet daily (Patient taking differently: Take 5 mg by mouth daily at 6 PM. Take 1 tablet daily) 180 tablet 3 11/01/2017 at 1800    Home dose = 5mg  daily per PTA med list.  Assessment: INR supratherapeutic @ 4.74  Goal of Therapy:  INR 2-3   Plan:  HOLD warfarin x 1 dose Daily PT/INR Monitor for s/s of bleeding   Margot Ables, PharmD Clinical Pharmacist 11/04/2017 8:46 AM

## 2017-11-04 NOTE — Progress Notes (Signed)
PROGRESS NOTE    [name]  LZJ:[id number] DOB: [birth year] DOA: 11/02/2017 PCP: Sharilyn Sites, MD     Brief Narrative:  72 year old woman admitted from home on 2/4 due to worsening of chronic abdominal pain with nausea and vomiting.  In addition she had been complaining of a nonproductive cough and low-grade fevers.  She was found to be hypoxemic with worsening renal failure.   Assessment & Plan:   Principal Problem:   Renal failure (ARF), acute on chronic (HCC) Active Problems:   Atrial flutter (HCC)   SLE (systemic lupus erythematosus) (HCC)   Essential hypertension, benign   COPD (chronic obstructive pulmonary disease) (HCC)   Anemia of chronic renal failure, stage 4 (severe) (HCC)   Acute respiratory failure with hypoxia (HCC)   Chronic diastolic heart failure (HCC)   Focal segmental glomerulosclerosis without nephrosis or chronic glomerulonephritis   Mixed hyperlipidemia   Type 2 diabetes mellitus with stage 4 chronic kidney disease (HCC)   Iron deficiency anemia   Community acquired pneumonia   Nausea and vomiting   AKI (acute kidney injury) ([place])   Acute renal failure superimposed on stage 4 chronic kidney disease (HCC)   COPD with acute exacerbation (HCC)   Acute hypoxemic respiratory failure -Due to COPD exacerbation and presumed aspiration pneumonia/lobar pneumonia. -Her respiratory virus panel has also resulted positive for RSV. -Agree with continued Zosyn given possibility of aspiration. -She has been weaned off BiPAP. -Improved leukocytosis on Zosyn, she has defervesced. -Continues to have an oxygen requirement which is new.  COPD with acute exacerbation -Continue nebs. -Continue steroids. -She appears to be clinically improving, will initiate steroid titration in a.m.  Acute on chronic kidney disease stage IV -Appreciate nephrology input and recommendations. -With worsening creatinine overnight, she already has AV fistula in place. -Certainly  she has no acute indications for hemodialysis but suspect she will need to start dialysis soon. -Will be started on oral bicarb tablets given metabolic acidosis due to advanced renal failure. -Renal biopsy in 2015 with collapsing FSGS plus severe arterial sclerosis with moderate to severe tubulointerstitial scarring and superimposed acute tubular injury.  UTI -UA with significant pyuria, unfortunately urine culture was not obtained on admission. -Zosyn should be sufficient coverage.  Abdominal pain/intractable nausea and vomiting -Has resolved, suspect viral gastroenteritis.  Chronic diastolic heart failure -Clinically appears euvolemic and compensated. -Echo from 2/5 has been reviewed: EF 55-60% with no wall motion abnormalities and grade 2 diastolic dysfunction,, PA pressure of 63.  Paroxysmal atrial fibrillation -Currently in sinus rhythm, anticoagulated on warfarin. -Rate controlled on metoprolol.  Systemic lupus -Continue Plaquenil  Essential hypertension -Well-controlled, continue current management  Type 2 diabetes -Recent hemoglobin A1c of 5.6 demonstrates excellent control. -CBGs remain elevated today: 390, 330, 174, 258, 333, 291; suspect secondary to steroid use. -Should start to improve once steroids are titrated. -For unclear reasons Levemir was held on admission, will restart at home dose of 10 units.   DVT prophylaxis: Warfarin Code Status: Full code Family Communication: Husband at bedside updated on plan of care and questions answered Disposition Plan: Husband has questions about patient's disposition after discharge given the layout of their house.  We will request PT evaluation and case management consultation for further recommendations.  Consultants:   Nephrology  Procedures:   Echo as above  Antimicrobials:  Anti-infectives (From admission, onward)   Start     Dose/Rate Route Frequency Ordered Stop   11/03/17 2200  cefTRIAXone (ROCEPHIN) 1 g in  dextrose 5 % 50  mL IVPB  Status:  Discontinued     1 g 100 mL/hr over 30 Minutes Intravenous Every 24 hours 11/02/17 2234 11/03/17 0712   11/03/17 2200  piperacillin-tazobactam (ZOSYN) IVPB 3.375 g     3.375 g 12.5 mL/hr over 240 Minutes Intravenous Every 12 hours 11/03/17 1006     11/03/17 1000  hydroxychloroquine (PLAQUENIL) tablet 300 mg    Comments:  Take 1 1/2 tablets daily     300 mg Oral Daily 11/02/17 2214     11/03/17 0900  piperacillin-tazobactam (ZOSYN) 2.25 g in dextrose 5 % 50 mL IVPB  Status:  Discontinued     2.25 g 100 mL/hr over 30 Minutes Intravenous Every 8 hours 11/03/17 0739 11/03/17 1005   11/03/17 0730  piperacillin-tazobactam (ZOSYN) IVPB 2.25 g  Status:  Discontinued     2.25 g 100 mL/hr over 30 Minutes Intravenous Every 6 hours 11/03/17 0712 11/03/17 0739   11/02/17 2300  azithromycin (ZITHROMAX) 500 mg in dextrose 5 % 250 mL IVPB     500 mg 250 mL/hr over 60 Minutes Intravenous Every 24 hours 11/02/17 2214     11/02/17 2015  azithromycin (ZITHROMAX) 500 mg in dextrose 5 % 250 mL IVPB  Status:  Discontinued     500 mg 250 mL/hr over 60 Minutes Intravenous  Once 11/02/17 2004 11/02/17 2214   11/02/17 2015  cefTRIAXone (ROCEPHIN) 2 g in dextrose 5 % 50 mL IVPB     2 g 100 mL/hr over 30 Minutes Intravenous  Once 11/02/17 2004 11/02/17 2348       Subjective: Lying in bed, no complaints, feels less short of breath, denies subjective fevers or chills.  Denies chest pain.  States she is very weak and has concerns about going home without assistance.  Objective: Vitals:   11/04/17 0500 11/04/17 0546 11/04/17 0739 11/04/17 1425  BP:  126/60    Pulse:  92    Resp:  20    Temp:  97.6 F (36.4 C)    TempSrc:  Oral    SpO2:  100% 100% 96%  Weight: 84.5 kg (186 lb 4.6 oz)     Height:        Intake/Output Summary (Last 24 hours) at 11/04/2017 1722 Last data filed at 11/04/2017 1500 Gross per 24 hour  Intake 900 ml  Output 1050 ml  Net -150 ml   Filed Weights    11/03/17 0017 11/03/17 0639 11/04/17 0500  Weight: 83.9 kg (184 lb 15.5 oz) 83.9 kg (184 lb 15.5 oz) 84.5 kg (186 lb 4.6 oz)    Examination:  General exam: Alert, awake, oriented x 3 Respiratory system: Clear to auscultation. Respiratory effort normal. Cardiovascular system:RRR. No murmurs, rubs, gallops. Gastrointestinal system: Abdomen is nondistended, soft and nontender. No organomegaly or masses felt. Normal bowel sounds heard. Central nervous system: Alert and oriented. No focal neurological deficits. Extremities: No C/C/E, +pedal pulses Skin: No rashes, lesions or ulcers Psychiatry: Judgement and insight appear normal. Mood & affect appropriate.     Data Reviewed: I have personally reviewed following labs and imaging studies  CBC: Recent Labs  Lab 11/02/17 1536 11/03/17 0208 11/03/17 0801 11/04/17 0442  WBC 17.7* 18.2*  --  10.8*  NEUTROABS 16.3*  --   --   --   HGB 10.1* 8.8* 8.5* 10.1*  HCT 32.2* 27.9* 27.6* 32.1*  MCV 87.5 86.9  --  87.7  PLT 262 279  --  570   Basic Metabolic Panel: Recent Labs  Lab  11/02/17 1536 11/03/17 0208 11/04/17 0442  NA 136 134* 134*  K 3.9 4.2 4.4  CL 105 103 101  CO2 18* 18* 18*  GLUCOSE 70 235* 235*  BUN 90* 89* 100*  CREATININE 4.00* 3.97* 4.40*  CALCIUM 8.8* 8.6* 8.8*  PHOS  --   --  7.5*   GFR: Estimated Creatinine Clearance: 12.3 mL/min (A) (by C-G formula based on SCr of 4.4 mg/dL (H)). Liver Function Tests: Recent Labs  Lab 11/02/17 1536 11/04/17 0442  AST 37  --   ALT 27  --   ALKPHOS 108  --   BILITOT 0.6  --   PROT 7.4  --   ALBUMIN 2.3* 2.1*   Recent Labs  Lab 11/02/17 1536  LIPASE 20   No results for input(s): AMMONIA in the last 168 hours. Coagulation Profile: Recent Labs  Lab 11/02/17 1537 11/03/17 0208 11/04/17 0442  INR 2.21 2.56 4.74*   Cardiac Enzymes: Recent Labs  Lab 11/02/17 1536 11/02/17 2011 11/03/17 0208 11/03/17 0801 11/03/17 1432  TROPONINI 0.09* 0.08* 0.09* 0.09*  0.08*   BNP (last 3 results) No results for input(s): PROBNP in the last 8760 hours. HbA1C: No results for input(s): HGBA1C in the last 72 hours. CBG: Recent Labs  Lab 11/04/17 0017 11/04/17 0348 11/04/17 0746 11/04/17 1200 11/04/17 1642  GLUCAP 333* 258* 174* 330* 390*   Lipid Profile: No results for input(s): CHOL, HDL, LDLCALC, TRIG, CHOLHDL, LDLDIRECT in the last 72 hours. Thyroid Function Tests: No results for input(s): TSH, T4TOTAL, FREET4, T3FREE, THYROIDAB in the last 72 hours. Anemia Panel: Recent Labs    11/03/17 1432  FERRITIN 752*  TIBC 104*  IRON 12*   Urine analysis:    Component Value Date/Time   COLORURINE YELLOW 11/02/2017 1527   APPEARANCEUR CLOUDY (A) 11/02/2017 1527   LABSPEC 1.010 11/02/2017 1527   PHURINE 8.0 11/02/2017 1527   GLUCOSEU NEGATIVE 11/02/2017 1527   HGBUR NEGATIVE 11/02/2017 1527   BILIRUBINUR NEGATIVE 11/02/2017 1527   KETONESUR NEGATIVE 11/02/2017 1527   PROTEINUR 100 (A) 11/02/2017 1527   UROBILINOGEN 1.0 03/17/2014 1830   NITRITE NEGATIVE 11/02/2017 1527   LEUKOCYTESUR LARGE (A) 11/02/2017 1527   Sepsis Labs: @LABRCNTIP (procalcitonin:4,lacticidven:4)  ) Recent Results (from the past 240 hour(s))  Culture, blood (Routine X 2) w Reflex to ID Panel     Status: None (Preliminary result)   Collection Time: 11/02/17  8:11 PM  Result Value Ref Range Status   Specimen Description BLOOD RIGHT FOREARM  Final   Special Requests   Final    BOTTLES DRAWN AEROBIC ONLY Blood Culture adequate volume   Culture   Final    NO GROWTH 2 DAYS Performed at [facility], [address], [place], [place] [address]    Report Status PENDING  Incomplete  Culture, blood (Routine X 2) w Reflex to ID Panel     Status: None (Preliminary result)   Collection Time: 11/02/17  8:11 PM  Result Value Ref Range Status   Specimen Description BLOOD RIGHT WRIST  Final   Special Requests   Final    BOTTLES DRAWN AEROBIC ONLY Blood Culture adequate volume    Culture   Final    NO GROWTH 2 DAYS Performed at [facility], [address], [place], [place] [address]    Report Status PENDING  Incomplete  Respiratory Panel by PCR     Status: Abnormal   Collection Time: 11/02/17 10:13 PM  Result Value Ref Range Status   Adenovirus NOT  DETECTED NOT DETECTED Final   Coronavirus 229E NOT DETECTED NOT DETECTED Final   Coronavirus HKU1 NOT DETECTED NOT DETECTED Final   Coronavirus NL63 NOT DETECTED NOT DETECTED Final   Coronavirus OC43 NOT DETECTED NOT DETECTED Final   Metapneumovirus NOT DETECTED NOT DETECTED Final   Rhinovirus / Enterovirus NOT DETECTED NOT DETECTED Final   Influenza A NOT DETECTED NOT DETECTED Final   Influenza B NOT DETECTED NOT DETECTED Final   Parainfluenza Virus 1 NOT DETECTED NOT DETECTED Final   Parainfluenza Virus 2 NOT DETECTED NOT DETECTED Final   Parainfluenza Virus 3 NOT DETECTED NOT DETECTED Final   Parainfluenza Virus 4 NOT DETECTED NOT DETECTED Final   Respiratory Syncytial Virus DETECTED (A) NOT DETECTED Final    Comment: CRITICAL RESULT CALLED TO, READ BACK BY AND VERIFIED WITH: RN A HOBBS [id number]    Bordetella pertussis NOT DETECTED NOT DETECTED Final   Chlamydophila pneumoniae NOT DETECTED NOT DETECTED Final   Mycoplasma pneumoniae NOT DETECTED NOT DETECTED Final    Comment: Performed at [facility], [place] [address] [place], [place] [address]  MRSA PCR Screening     Status: None   Collection Time: 11/03/17 10:00 AM  Result Value Ref Range Status   MRSA by PCR NEGATIVE NEGATIVE Final    Comment:        The GeneXpert MRSA Assay (FDA approved for NASAL specimens only), is one component of a comprehensive MRSA colonization surveillance program. It is not intended to diagnose MRSA infection nor to guide or monitor treatment for MRSA infections. Performed at [facility], [address], [place], [place] [address]          Radiology Studies: Ct Abdomen Pelvis Wo Contrast  Result Date:  11/02/2017 CLINICAL DATA:  Emesis for the last 3 days.  Intermittent fever. EXAM: CT ABDOMEN AND PELVIS WITHOUT CONTRAST TECHNIQUE: Multidetector CT imaging of the abdomen and pelvis was performed following the standard protocol without IV contrast. COMPARISON:  09/09/2017.  08/31/2017. FINDINGS: Lower chest: There is a small left effusion. There is segmental consolidation in the left lower lobe suggesting pneumonia. The heart is mildly enlarged. Hepatobiliary: No liver parenchymal abnormality is seen. There is some layering sludge or stones in the gallbladder. No CT evidence of cholecystitis. Pancreas: Normal Spleen: Normal Adrenals/Urinary Tract: Adrenal glands are normal. Kidneys show vascular calcifications but no acute finding. Bladder appears normal. Stomach/Bowel: No acute bowel pathology is seen. No sign of obstruction or inflammatory disease. Vascular/Lymphatic: Advanced aortic atherosclerosis. Atherosclerosis of the aortic branch vessels. IVC is normal. No retroperitoneal adenopathy. Reproductive: Previous hysterectomy.  No pelvic mass. Other: No free fluid or air. Musculoskeletal: Chronic lumbar degenerative changes. No acute bone finding. IMPRESSION: Segmental consolidation in the left lower lobe consistent with bronchopneumonia. Small amount of left pleural fluid. Sludge or gallstones in the gallbladder. Aortic atherosclerosis. No acute bowel pathology seen. No obstruction or inflammatory finding. Electronically Signed   By: Nelson Chimes M.D.   On: 11/02/2017 18:28   Dg Tibia/fibula Right  Result Date: 11/02/2017 CLINICAL DATA:  Right lower leg ulcers. EXAM: RIGHT TIBIA AND FIBULA - 2 VIEW COMPARISON:  None. FINDINGS: There is no evidence of fracture or other focal bone lesions. Surgical screw is seen in distal right fibula. Vascular calcifications are noted. No lytic destruction is seen to suggest osteomyelitis. IMPRESSION: No fracture or dislocation is noted. No definite evidence of osteomyelitis.  Electronically Signed   By: Marijo Conception, M.D.   On: 11/02/2017 17:30  US Abdomen Complete  Result Date: 11/03/2017 CLINICAL DATA:  Emesis for 3 days and intermittent fever. Chronic abdominal pain. EXAM: ABDOMEN ULTRASOUND COMPLETE COMPARISON:  Abdominal CT 11/02/2017 FINDINGS: Gallbladder: Small amount of echogenic sludge within the gallbladder. No gallbladder wall distention or wall thickening. Reportedly, the patient does not have a sonographic Murphy sign. No gallstones. Common bile duct: Diameter: 0.4 cm. Liver: No focal lesion identified. Within normal limits in parenchymal echogenicity. Portal vein is patent on color Doppler imaging with normal direction of blood flow towards the liver. IVC: No abnormality visualized. Pancreas: Visualized portion unremarkable. Spleen: Size and appearance within normal limits. Right Kidney: Length: 11.6 cm. Increased echogenicity in the right kidney without hydronephrosis. There is a indeterminate hypoechoic structure along the lateral upper pole region. This structure measures 2.6 x 2.4 x 2.8 cm. Structure may be partially cystic but cannot exclude a solid component. Review of the prior non contrast CT demonstrates minimal fullness in this area but there is not an obvious lesion based on the non contrast images. Left Kidney: Length: 10.1 cm. Echogenicity within normal limits. No mass or hydronephrosis visualized. Abdominal aorta: No aneurysm visualized. Other findings: None. IMPRESSION: Indeterminate right renal lesion measuring up to 2.8 cm. This could represent a complex cystic lesion but cannot exclude a solid component. This lesion is not well characterized on the prior non contrast CTs. Patient is not a candidate for contrast enhanced studies due to renal function but this lesion could be further characterized with a non contrast renal MRI. Gallbladder sludge without stones or biliary dilatation. Electronically Signed   By: Markus Daft M.D.   On: 11/03/2017 11:50     Dg Chest Port 1 View  Result Date: 11/03/2017 CLINICAL DATA:  Respiratory failure EXAM: PORTABLE CHEST 1 VIEW COMPARISON:  November 12, 2017 FINDINGS: There is stable cardiomegaly with mild pulmonary venous hypertension. There is atelectatic change in the left base. No frank edema or consolidation evident. There is aortic atherosclerosis. No bone lesions appreciable. IMPRESSION: Evidence of a degree of pulmonary vascular congestion without frank edema or consolidation. There is left base atelectasis. There is aortic atherosclerosis. Aortic Atherosclerosis (ICD10-I70.0). Electronically Signed   By: Lowella Grip III M.D.   On: 11/03/2017 08:37   Dg Chest Port 1 View  Result Date: 11/02/2017 CLINICAL DATA:  Shortness of breath and cough. EXAM: PORTABLE CHEST 1 VIEW COMPARISON:  August 13, 2017 FINDINGS: Stable cardiomegaly. Mild edema. No other interval changes or acute abnormalities. IMPRESSION: Cardiomegaly and mild edema. Electronically Signed   By: Dorise Bullion III M.D   On: 11/02/2017 19:11        Scheduled Meds: . atorvastatin  40 mg Oral QHS  . brimonidine  1 drop Left Eye Daily   And  . timolol  1 drop Left Eye Daily  . calcitRIOL  0.25 mcg Oral Once per day on Mon Wed Fri  . calcium acetate  667 mg Oral TID WC  . febuxostat  40 mg Oral Daily  . feeding supplement  1 Container Oral TID BM  . folic acid  1 mg Intravenous Daily  . hydroxychloroquine  300 mg Oral Daily  . insulin aspart  0-9 Units Subcutaneous Q4H  . ipratropium-albuterol  3 mL Nebulization Q6H  . ketorolac  1 drop Left Eye Daily  . latanoprost  1 drop Left Eye QHS  . methylPREDNISolone (SOLU-MEDROL) injection  60 mg Intravenous Q8H  . metoprolol tartrate  2.5 mg Intravenous Q6H  . pantoprazole (PROTONIX) IV  40 mg Intravenous Q24H  . sodium bicarbonate  650 mg Oral TID  . Warfarin - Pharmacist Dosing Inpatient   Does not apply Q24H   Continuous Infusions: . azithromycin Stopped (11/04/17 0116)  .  furosemide Stopped (11/04/17 1819)  . piperacillin-tazobactam (ZOSYN)  IV Stopped (11/04/17 1402)     LOS: 2 days    Time spent: 45 minutes.    Lelon Frohlich, MD [facility] Pager [phone]  If 7PM-7AM, please contact night-coverage [email or web address] Password [email or web address] 11/04/2017, 5:22 PM

## 2017-11-04 NOTE — Progress Notes (Signed)
[name]  MRN: [id number]  DOB/AGE: 1946/06/04 72 y.o.  Primary Care Physician:Golding, Jenny Reichmann, MD  Admit date: 11/02/2017  Chief Complaint:  Chief Complaint  Patient presents with  . Emesis    S-Pt presented on  11/02/2017 with  Chief Complaint  Patient presents with  . Emesis  .    Pt today feels better.Pt says " I am doing fair"   Meds . atorvastatin  40 mg Oral QHS  . brimonidine  1 drop Left Eye Daily   And  . timolol  1 drop Left Eye Daily  . calcitRIOL  0.25 mcg Oral Once per day on Mon Wed Fri  . febuxostat  40 mg Oral Daily  . feeding supplement  1 Container Oral TID BM  . folic acid  1 mg Intravenous Daily  . hydroxychloroquine  300 mg Oral Daily  . insulin aspart  0-9 Units Subcutaneous Q4H  . ipratropium-albuterol  3 mL Nebulization Q6H  . ketorolac  1 drop Left Eye Daily  . latanoprost  1 drop Left Eye QHS  . methylPREDNISolone (SOLU-MEDROL) injection  60 mg Intravenous Q8H  . metoprolol tartrate  2.5 mg Intravenous Q6H  . pantoprazole (PROTONIX) IV  40 mg Intravenous Q24H  . pneumococcal 23 valent vaccine  0.5 mL Intramuscular Tomorrow-1000  . Warfarin - Pharmacist Dosing Inpatient   Does not apply Q24H          Physical Exam: Vital signs in last 24 hours: Temp:  [97.6 F (36.4 C)-97.8 F (36.6 C)] 97.6 F (36.4 C) (02/06 0546) Pulse Rate:  [86-94] 92 (02/06 0546) Resp:  [16-20] 20 (02/06 0546) BP: (126-136)/(60-73) 126/60 (02/06 0546) SpO2:  [97 %-100 %] 100 % (02/06 0739) Weight:  [186 lb 4.6 oz (84.5 kg)] 186 lb 4.6 oz (84.5 kg) (02/06 0500) Weight change: -11.4 oz (-0.776 kg) Last BM Date: (Pt unsure of last BM)  Intake/Output from previous day: 02/05 0701 - 02/06 0700 In: 1883.3 [P.O.:960; I.V.:249.3; IV Piggyback:674] Out: 700 [Urine:700] No intake/output data recorded.   Physical Exam: General- pt is awake,alert, oriented to time place and person Resp- No acute REsp distress,  Rhonchi+ CVS- S1S2 regular ij rate and  rhythm GIT- BS+, soft, NT, ND EXT- NO LE Edema, Cyanosis Access- left AVF  Lab Results: CBC Recent Labs    11/03/17 0208 11/03/17 0801 11/04/17 0442  WBC 18.2*  --  10.8*  HGB 8.8* 8.5* 10.1*  HCT 27.9* 27.6* 32.1*  PLT 279  --  272    BMET Recent Labs    11/03/17 0208 11/04/17 0442  NA 134* 134*  K 4.2 4.4  CL 103 101  CO2 18* 18*  GLUCOSE 235* 235*  BUN 89* 100*  CREATININE 3.97* 4.40*  CALCIUM 8.6* 8.8*    Trend  Creat 2019  2.3=> 4.0=>4.4 2018    2.4--2.7 2017   2.2--2.6 2016   2.1--2.2 2015   2.2--3.2 2014   1.2--1.4 2013   1.1--1.6   KIDNEY, NEEDLE BIOPSY:done in June 2015 COLLAPSING VARIANT OF FOCAL AND SEGMENTAL GLOMERULOSCLEROSIS IN ASSOCIATION WITH DIABETIC GLOMERULOSCLEROSIS. SEVERE ARTERIOSCLEROSIS WITH MODERATE TO SEVERE TUBULOINTERSTITIAL SCARRING ANDSUPERIMPOSED ACUTE TUBULAR INJURY.    MICRO Recent Results (from the past 240 hour(s))  Culture, blood (Routine X 2) w Reflex to ID Panel     Status: None (Preliminary result)   Collection Time: 11/02/17  8:11 PM  Result Value Ref Range Status   Specimen Description BLOOD RIGHT FOREARM  Final   Special Requests  Final    BOTTLES DRAWN AEROBIC ONLY Blood Culture adequate volume   Culture   Final    NO GROWTH 2 DAYS Performed at [facility], [address], [place], [place] [address]    Report Status PENDING  Incomplete  Culture, blood (Routine X 2) w Reflex to ID Panel     Status: None (Preliminary result)   Collection Time: 11/02/17  8:11 PM  Result Value Ref Range Status   Specimen Description BLOOD RIGHT WRIST  Final   Special Requests   Final    BOTTLES DRAWN AEROBIC ONLY Blood Culture adequate volume   Culture   Final    NO GROWTH 2 DAYS Performed at [facility], [address], [place], [place] [address]    Report Status PENDING  Incomplete  Respiratory Panel by PCR     Status: Abnormal   Collection Time: 11/02/17 10:13 PM  Result Value Ref Range Status   Adenovirus NOT  DETECTED NOT DETECTED Final   Coronavirus 229E NOT DETECTED NOT DETECTED Final   Coronavirus HKU1 NOT DETECTED NOT DETECTED Final   Coronavirus NL63 NOT DETECTED NOT DETECTED Final   Coronavirus OC43 NOT DETECTED NOT DETECTED Final   Metapneumovirus NOT DETECTED NOT DETECTED Final   Rhinovirus / Enterovirus NOT DETECTED NOT DETECTED Final   Influenza A NOT DETECTED NOT DETECTED Final   Influenza B NOT DETECTED NOT DETECTED Final   Parainfluenza Virus 1 NOT DETECTED NOT DETECTED Final   Parainfluenza Virus 2 NOT DETECTED NOT DETECTED Final   Parainfluenza Virus 3 NOT DETECTED NOT DETECTED Final   Parainfluenza Virus 4 NOT DETECTED NOT DETECTED Final   Respiratory Syncytial Virus DETECTED (A) NOT DETECTED Final    Comment: CRITICAL RESULT CALLED TO, READ BACK BY AND VERIFIED WITH: RN A HOBBS [id number] MLM    Bordetella pertussis NOT DETECTED NOT DETECTED Final   Chlamydophila pneumoniae NOT DETECTED NOT DETECTED Final   Mycoplasma pneumoniae NOT DETECTED NOT DETECTED Final    Comment: Performed at [facility], [place] [address], [place], [place] [address]  MRSA PCR Screening     Status: None   Collection Time: 11/03/17 10:00 AM  Result Value Ref Range Status   MRSA by PCR NEGATIVE NEGATIVE Final    Comment:        The GeneXpert MRSA Assay (FDA approved for NASAL specimens only), is one component of a comprehensive MRSA colonization surveillance program. It is not intended to diagnose MRSA infection nor to guide or monitor treatment for MRSA infections. Performed at [facility], [address] [place], [place] [address]       Lab Results  Component Value Date   PTH 291.8 (H) 03/16/2014   CALCIUM 8.8 (L) 11/04/2017   CAION 1.08 (L) 08/31/2017   PHOS 7.5 (H) 11/04/2017               Impression: 1)Renal  AKI secondary to ATN vs CKD progression                AKI on CKD               CKD stage 4.               CKD since 2012               CKD secondary  to Collapsing FSGS + DM + Severe Arteriosclreosis                Progression of  CKD marked with AKI                Proteinura  Present.                I educated pt about her biopsy results               I educated pt about her worsening of kidney disease and possible need of hd soon. Pt and her family voiced understanding.     2)HTN  Medication- On Beta blockers   3)Anemia HGb low  4)CKD Mineral-Bone Disorder PTH elevated . Secondary Hyperparathyroidism  present. Phosphorus not at goal. Vitamin 25-OH will check.  5)Resp-admited with dyspnea  hx of COPD  RSV positicve PMD following  6)Electrolytes  Normokalemic  hyponatremic  7)Acid base Co2 not at goal     Plan:   will suggest to start po bicarb Will suggest to start phos binders NO need of Hd today but will need son   Liana Gerold 11/04/2017, 9:29 AM

## 2017-11-04 NOTE — Evaluation (Signed)
Clinical/Bedside Swallow Evaluation Patient Details  Name: [name] MRN: [id number] Date of Birth: 24-May-1946  Today's Date: 11/04/2017 Time: SLP Start Time (ACUTE ONLY): 72 SLP Stop Time (ACUTE ONLY): 1113 SLP Time Calculation (min) (ACUTE ONLY): 23 min  Past Medical History:  Past Medical History:  Diagnosis Date  . Anemia   . Arthritis   . Asthma   . Atrial fibrillation ([place])   . Atrial flutter [facility])    TEE/DCCV December 2013 - on Xarelto [facility])  . Bone spur of toe    Right 5th toe  . Chronic diastolic CHF (congestive heart failure) (HCC)    a. EF 50-55% by echo in 2015  . CKD (chronic kidney disease) stage 4, GFR 15-29 ml/min (HCC)    a. s/p fistula placement but not on HD. Followed by Nephrology.   Marland Kitchen COPD (chronic obstructive pulmonary disease) ([place])   . DVT of upper extremity (deep vein thrombosis) ([place])    Right basilic vein, June 0102  . Essential hypertension, benign   . Fractures   . SLE (systemic lupus erythematosus) ([place])   . Type 2 diabetes mellitus ([place])   . UTI (lower urinary tract infection)    Past Surgical History:  Past Surgical History:  Procedure Laterality Date  . ABDOMINAL HYSTERECTOMY  1983  . ANKLE SURGERY  1993  . AV FISTULA PLACEMENT Left 03/27/2014   Procedure: ARTERIOVENOUS (AV) FISTULA CREATION;  Surgeon: Rosetta Posner, MD;  Location: [place];  Service: Vascular;  Laterality: Left;  . BACK SURGERY  1980  . CARDIOVASCULAR STRESS TEST  12/19/2009   no stress induced rhythm abnormalities, ekg negative for ischemia  . CARDIOVERSION  09/16/2012   Procedure: CARDIOVERSION;  Surgeon: Sanda Klein, MD;  Location: [facility] ENDOSCOPY;  Service: Cardiovascular;  Laterality: N/A;  . COLONOSCOPY  09/20/2012   Dr. [name]: multiple tubular adenomas   . COLONOSCOPY  2005   Dr. [name]. Polyps, path unknown   . COLONOSCOPY N/A 07/24/2016   Procedure: COLONOSCOPY;  Surgeon: [name], MD;  [facility];  Service: Endoscopy;  Laterality:  N/A;  9:00 am  . ESOPHAGOGASTRODUODENOSCOPY  09/20/2012   Dr. [name]: duodenal erosion and possible resolving ulcer at the angularis   . TEE WITHOUT CARDIOVERSION  09/16/2012   Procedure: TRANSESOPHAGEAL ECHOCARDIOGRAM (TEE);  Surgeon: Sanda Klein, MD;  Location: [facility] ENDOSCOPY;  Service: Cardiovascular;  Laterality: N/A;  . TRANSESOPHAGEAL ECHOCARDIOGRAM WITH CARDIOVERSION  09/16/2012   EF 60-65%, moderate LVH, moderate regurg of the aortic valve, LA moderately dilated   HPI:  72 year old female with a history of CKD stage IV, folic acid deficiency, diastolic CHF, atrial fibrillation on warfarin, COPD, hypertension, diabetes mellitus type 2, lupus, focal segmental glomerulosclerosis, presenting with 2-3-day history of worsening of her chronic abdominal pain with associated nausea and vomiting.  In addition, the patient has been complaining of a nonproductive cough with low-grade fevers.  She denies any sick contacts, and she no longer smokes.  There is been no recent travels.  She has had some loose stools over the past 2-3 days without hematochezia or melena.  She has not been on antibiotics recently.  Since admission, there has not been any vomiting or diarrhea.  In the early morning 11/03/17, the patient developed respiratory distress.  The patient was placed on BiPAP and started on a furosemide drip.  Nephrology was consulted.  The patient normally follows Dr. Moshe Cipro at [facility]    Assessment / Plan / Recommendation Clinical Impression  Clinical swallow  evaluation completed at bedside husband present in room. Pt currently on clear liquids and she c/o wanting different foods. Pt/husband deny previous dysphagia prior to admission. Pt with coated tongue and U/L dentures; no gross asymmetry, mild generalized oral weakness noted. Pt without overt signs or symptoms of aspiration, but does demonstrate mildly prolonged oral phase with solid textures. Recommend D3/mech soft and thin liquids  with assist for meals. OK for po medications whole with water or puree. SLP will follow during acute stay, but do not anticipate SLP needs post discharge at this time.   SLP Visit Diagnosis: Dysphagia, unspecified (R13.10)    Aspiration Risk  Mild aspiration risk    Diet Recommendation Dysphagia 3 (Mech soft);Thin liquid   Liquid Administration via: Cup;Straw Medication Administration: Whole meds with liquid Supervision: Patient able to self feed Postural Changes: Seated upright at 90 degrees;Remain upright for at least 30 minutes after po intake    Other  Recommendations Oral Care Recommendations: Oral care BID Other Recommendations: Clarify dietary restrictions   Follow up Recommendations None      Frequency and Duration min 2x/week  1 week       Prognosis Prognosis for Safe Diet Advancement: Good      Swallow Study   General Date of Onset: 11/02/17 HPI: 72 year old female with a history of CKD stage IV, folic acid deficiency, diastolic CHF, atrial fibrillation on warfarin, COPD, hypertension, diabetes mellitus type 2, lupus, focal segmental glomerulosclerosis, presenting with 2-3-day history of worsening of her chronic abdominal pain with associated nausea and vomiting.  In addition, the patient has been complaining of a nonproductive cough with low-grade fevers.  She denies any sick contacts, and she no longer smokes.  There is been no recent travels.  She has had some loose stools over the past 2-3 days without hematochezia or melena.  She has not been on antibiotics recently.  Since admission, there has not been any vomiting or diarrhea.  In the early morning 11/03/17, the patient developed respiratory distress.  The patient was placed on BiPAP and started on a furosemide drip.  Nephrology was consulted.  The patient normally follows Dr. Moshe Cipro at [facility].  Type of Study: Bedside Swallow Evaluation Previous Swallow Assessment: None on record Diet Prior to this  Study: (clear liquids) Temperature Spikes Noted: No Respiratory Status: Nasal cannula History of Recent Intubation: No Behavior/Cognition: Alert;Cooperative;Pleasant mood Oral Cavity Assessment: (coated tongue) Oral Care Completed by SLP: Yes Oral Cavity - Dentition: Dentures, top;Dentures, bottom Vision: Functional for self-feeding Self-Feeding Abilities: Able to feed self;Needs set up Patient Positioning: Upright in bed Baseline Vocal Quality: Normal Volitional Cough: Strong;Congested Volitional Swallow: Able to elicit    Oral/Motor/Sensory Function Overall Oral Motor/Sensory Function: Within functional limits   Ice Chips Ice chips: Within functional limits   Thin Liquid Thin Liquid: Within functional limits Presentation: Cup;Straw;Self Fed    Nectar Thick Nectar Thick Liquid: Not tested   Honey Thick Honey Thick Liquid: Not tested   Puree Puree: Within functional limits Presentation: Spoon   Solid      Solid: Impaired Presentation: Self Fed Oral Phase Impairments: Reduced lingual movement/coordination;Impaired mastication Oral Phase Functional Implications: Prolonged oral transit       Thank you,  Genene Churn, [place]  [name] 11/04/2017,12:29 PM

## 2017-11-04 NOTE — Progress Notes (Signed)
Inpatient Diabetes Program Recommendations  AACE/ADA: New Consensus Statement on Inpatient Glycemic Control (2015)  Target Ranges:  Prepandial:   less than 140 mg/dL      Peak postprandial:   less than 180 mg/dL (1-2 hours)      Critically ill patients:  140 - 180 mg/dL  Results for SHILEE, BIGGS (MRN [id number]) as of 11/04/2017 10:23  Ref. Range 11/03/2017 07:24 11/03/2017 11:39 11/03/2017 16:25 11/03/2017 21:01 11/04/2017 00:17 11/04/2017 03:48 11/04/2017 07:46  Glucose-Capillary Latest Ref Range: 65 - 99 mg/dL 189 (H) 147 (H) 197 (H) 291 (H) 333 (H) 258 (H) 174 (H)    Review of Glycemic Control  Current orders for Inpatient glycemic control: Novolog 0-9 units Q4H; Solumedrol 60 mg Q8H  Inpatient Diabetes Program Recommendations:  Insulin - Basal: Please consider ordering Levemir 8 units Q24H (based on 82 kg x 0.1 units).  Thanks, Barnie Alderman, RN, MSN, CDE Diabetes Coordinator Inpatient Diabetes Program [phone] (Team Pager from 8am to 5pm)

## 2017-11-05 LAB — GLUCOSE, CAPILLARY
GLUCOSE-CAPILLARY: 192 mg/dL — AB (ref 65–99)
Glucose-Capillary: 152 mg/dL — ABNORMAL HIGH (ref 65–99)
Glucose-Capillary: 154 mg/dL — ABNORMAL HIGH (ref 65–99)
Glucose-Capillary: 196 mg/dL — ABNORMAL HIGH (ref 65–99)
Glucose-Capillary: 220 mg/dL — ABNORMAL HIGH (ref 65–99)
Glucose-Capillary: 237 mg/dL — ABNORMAL HIGH (ref 65–99)

## 2017-11-05 LAB — RENAL FUNCTION PANEL
ALBUMIN: 2.1 g/dL — AB (ref 3.5–5.0)
ANION GAP: 15 (ref 5–15)
BUN: 116 mg/dL — ABNORMAL HIGH (ref 6–20)
CO2: 20 mmol/L — ABNORMAL LOW (ref 22–32)
Calcium: 8.8 mg/dL — ABNORMAL LOW (ref 8.9–10.3)
Chloride: 98 mmol/L — ABNORMAL LOW (ref 101–111)
Creatinine, Ser: 4.53 mg/dL — ABNORMAL HIGH (ref 0.44–1.00)
GFR calc Af Amer: 10 mL/min — ABNORMAL LOW (ref >60)
GFR, EST NON AFRICAN AMERICAN: 9 mL/min — AB (ref >60)
Glucose, Bld: 146 mg/dL — ABNORMAL HIGH (ref 65–99)
PHOSPHORUS: 7.6 mg/dL — AB (ref 2.5–4.6)
Potassium: 4.2 mmol/L (ref 3.5–5.1)
Sodium: 133 mmol/L — ABNORMAL LOW (ref 135–145)

## 2017-11-05 LAB — PROTIME-INR
INR: >10
Prothrombin Time: 80.2 seconds — ABNORMAL HIGH (ref 11.4–15.2)

## 2017-11-05 LAB — LEGIONELLA PNEUMOPHILA SEROGP 1 UR AG: L. PNEUMOPHILA SEROGP 1 UR AG: NEGATIVE

## 2017-11-05 MED ORDER — METHYLPREDNISOLONE SODIUM SUCC 125 MG IJ SOLR
60.0000 mg | Freq: Two times a day (BID) | INTRAMUSCULAR | Status: DC
  Administered 2017-11-06 (×2): 60 mg via INTRAVENOUS
  Filled 2017-11-05 (×2): qty 2

## 2017-11-05 MED ORDER — PHYTONADIONE 5 MG PO TABS
5.0000 mg | ORAL_TABLET | Freq: Once | ORAL | Status: AC
  Administered 2017-11-05: 5 mg via ORAL
  Filled 2017-11-05: qty 1

## 2017-11-05 MED ORDER — CALCIUM ACETATE (PHOS BINDER) 667 MG PO CAPS
1334.0000 mg | ORAL_CAPSULE | Freq: Three times a day (TID) | ORAL | Status: DC
  Administered 2017-11-05 – 2017-11-13 (×23): 1334 mg via ORAL
  Filled 2017-11-05 (×23): qty 2

## 2017-11-05 NOTE — Progress Notes (Signed)
ANTICOAGULATION CONSULT NOTE - follow up  Pharmacy Consult for Warfarin (home med) Indication: atrial fibrillation  Allergies  Allergen Reactions  . Ace Inhibitors Cough   Patient Measurements: Height: 5\' 4"  (162.6 cm) Weight: 189 lb 9.5 oz (86 kg) IBW/kg (Calculated) : 54.7  Vital Signs: Temp: 98.1 F (36.7 C) (02/07 0434) Temp Source: Oral (02/07 0434) BP: 160/68 (02/07 0434) Pulse Rate: 81 (02/07 0434)  Labs: Recent Labs    11/02/17 1536  11/03/17 0208 11/03/17 0801 11/03/17 1432 11/04/17 0442 11/05/17 0540  HGB 10.1*  --  8.8* 8.5*  --  10.1*  --   HCT 32.2*  --  27.9* 27.6*  --  32.1*  --   PLT 262  --  279  --   --  272  --   LABPROT  --    < > 27.3*  --   --  44.1* 80.2*  INR  --    < > 2.56  --   --  4.74* >10.00*  CREATININE 4.00*  --  3.97*  --   --  4.40*  --   TROPONINI 0.09*   < > 0.09* 0.09* 0.08*  --   --    < > = values in this interval not displayed.   Estimated Creatinine Clearance: 12.4 mL/min (A) (by C-G formula based on SCr of 4.4 mg/dL (H)).  Medical History: Past Medical History:  Diagnosis Date  . Anemia   . Arthritis   . Asthma   . Atrial fibrillation ([place])   . Atrial flutter [facility])    TEE/DCCV December 2013 - on Xarelto [facility])  . Bone spur of toe    Right 5th toe  . Chronic diastolic CHF (congestive heart failure) (HCC)    a. EF 50-55% by echo in 2015  . CKD (chronic kidney disease) stage 4, GFR 15-29 ml/min (HCC)    a. s/p fistula placement but not on HD. Followed by Nephrology.   Marland Kitchen COPD (chronic obstructive pulmonary disease) ([place])   . DVT of upper extremity (deep vein thrombosis) ([place])    Right basilic vein, June 3559  . Essential hypertension, benign   . Fractures   . SLE (systemic lupus erythematosus) ([place])   . Type 2 diabetes mellitus ([place])   . UTI (lower urinary tract infection)    Medications:  Medications Prior to Admission  Medication Sig Dispense Refill Last Dose  . acetaminophen (TYLENOL) 500 MG tablet Take 1,000  mg by mouth every 6 (six) hours as needed. For pain   Taking  . albuterol (ACCUNEB) 0.63 MG/3ML nebulizer solution Take 1 ampule by nebulization every 6 (six) hours as needed for wheezing or shortness of breath.    11/01/2017 at Unknown time  . albuterol (PROVENTIL HFA;VENTOLIN HFA) 108 (90 BASE) MCG/ACT inhaler Inhale 2 puffs into the lungs every 6 (six) hours as needed. For shortness of breath   11/01/2017 at Unknown time  . amLODipine (NORVASC) 10 MG tablet Take 1 tablet (10 mg total) by mouth at bedtime. 30 tablet 0 11/02/2017 at Unknown time  . atorvastatin (LIPITOR) 40 MG tablet Take 40 mg at bedtime by mouth.    11/01/2017 at Unknown time  . brimonidine-timolol (COMBIGAN) 0.2-0.5 % ophthalmic solution Place 1 drop into the left eye daily.    11/01/2017 at Unknown time  . calcitRIOL (ROCALTROL) 0.25 MCG capsule Take 0.25 mcg by mouth as directed. Take on Mondays Wednesdays and Fridays   11/02/2017 at Unknown time  . diclofenac (VOLTAREN) 0.1 %  ophthalmic solution Place 1 drop into the left eye daily. Uses at lunch time   11/01/2017 at Unknown time  . dicyclomine (BENTYL) 10 MG capsule Take 10 mg by mouth 4 (four) times daily -  before meals and at bedtime.   11/02/2017 at Unknown time  . febuxostat (ULORIC) 40 MG tablet Take 40 mg by mouth daily.   11/02/2017 at Unknown time  . folic acid (FOLVITE) 1 MG tablet Take 1 mg by mouth daily.   11/02/2017 at Unknown time  . furosemide (LASIX) 80 MG tablet Take 160 mg by mouth 2 (two) times daily.    11/02/2017 at Unknown time  . HUMALOG KWIKPEN 100 UNIT/ML KiwkPen INJECT UP TO 11 UNITS SUBCUTANEOUSLY BEFORE MEALS THREE TIMES DAILY. 15 mL 2 11/02/2017 at Unknown time  . HYDROcodone-acetaminophen (NORCO/VICODIN) 5-325 MG tablet 1 or 2 tabs PO q12 hours prn pain 10 tablet 0 11/01/2017 at Unknown time  . hydroxychloroquine (PLAQUENIL) 200 MG tablet Take 200 mg by mouth as directed. Take 1 1/2 tablets daily   11/02/2017 at 900  . Insulin Detemir (LEVEMIR FLEXTOUCH) 100 UNIT/ML Pen  Inject 10 Units into the skin daily at 10 pm. 5 pen 2 11/01/2017 at Unknown time  . metoprolol tartrate (LOPRESSOR) 50 MG tablet TAKE ONE TABLET BY MOUTH TWICE DAILY. 60 tablet 6 11/02/2017 at 900  . Multiple Vitamins-Minerals (CENTRUM SILVER PO) Take 1 tablet by mouth daily.   11/02/2017 at Unknown time  . ONE TOUCH ULTRA TEST test strip TEST 4 TIMES A DAY AS DIRECTED. 150 each 5 11/02/2017 at Unknown time  . ONETOUCH DELICA LANCETS 25D MISC USE TO TEST 4 TIMES DAILY. 100 each 5 11/02/2017 at Unknown time  . predniSONE (DELTASONE) 5 MG tablet Take 5 mg by mouth daily with breakfast.   11/02/2017 at Unknown time  . travoprost, benzalkonium, (TRAVATAN) 0.004 % ophthalmic solution Place 1 drop into the left eye at bedtime.   11/01/2017 at Unknown time  . UNIFINE PENTIPS 31G X 6 MM MISC USE AS DIRECTED AT BEDTIME WITH LEVEMIR AND WITH SLIDING SCALE INSULIN. 150 each 5 11/02/2017 at Unknown time  . Vitamin D, Ergocalciferol, (DRISDOL) 50000 units CAPS capsule TAKE 1 CAPSULE BY MOUTH ONCE WEEKLY. 12 capsule 0 Past Week at Unknown time  . warfarin (COUMADIN) 5 MG tablet Take 1 tablet daily (Patient taking differently: Take 5 mg by mouth daily at 6 PM. Take 1 tablet daily) 180 tablet 3 11/01/2017 at 1800    Home dose = 5mg  daily per PTA med list.  Assessment: INR supratherapeutic @ >10  Goal of Therapy:  INR 2-3   Plan:  HOLD warfarin x 1 dose Daily PT/INR Monitor for s/s of bleeding   Margot Ables, PharmD Clinical Pharmacist 11/05/2017 9:55 AM

## 2017-11-05 NOTE — Progress Notes (Signed)
  Speech Language Pathology Treatment: Dysphagia  Patient Details Name: [name] MRN: [id number] DOB: 1945-12-28 Today's Date: 11/05/2017 Time: 9741-6384 SLP Time Calculation (min) (ACUTE ONLY): 37 min  Assessment / Plan / Recommendation Clinical Impression  Pt was provided dysphagia therapy while seated upright in the bed. Note severely coated lingual surface; SLP provided extensive oral care to clear lingual surface, cleanse dentures and both lateral sulccii also note sores on lower anterior portion of gums beneath dentures. Pt consumed regular textures with very prolonged mastication and prolonged AP transit of >30 seconds with continued generalized oral weakness; pt reports difficulty masticating meats on D3 diet. Recommend downgrade to D2 diet/fine chop and continue with thin liquids. Recommend slow rate, alternating bites and sips and pt to be seated at 90 degrees for all PO intake. Recommend Oral care after every meal with attention to lingual surface; Pt and nurse were instructed that dentures should be removed at night for thorough oral care and for proper treatment of lower gum sores. ST will continue to follow    HPI HPI: 72 year old female with a history of CKD stage IV, folic acid deficiency, diastolic CHF, atrial fibrillation on warfarin, COPD, hypertension, diabetes mellitus type 2, lupus, focal segmental glomerulosclerosis, presenting with 2-3-day history of worsening of her chronic abdominal pain with associated nausea and vomiting.  In addition, the patient has been complaining of a nonproductive cough with low-grade fevers.  She denies any sick contacts, and she no longer smokes.  There is been no recent travels.  She has had some loose stools over the past 2-3 days without hematochezia or melena.  She has not been on antibiotics recently.  Since admission, there has not been any vomiting or diarrhea.  In the early morning 11/03/17, the patient developed respiratory distress.   The patient was placed on BiPAP and started on a furosemide drip.  Nephrology was consulted.  The patient normally follows Dr. [facility]he Cipro at [facility].       SLP Plan  Continue with current plan of care       Recommendations  Diet recommendations: Dysphagia 2 (fine chop);Thin liquid Liquids provided via: Cup;Straw Medication Administration: Whole meds with liquid Supervision: Patient able to self feed;Staff to assist with self feeding;Intermittent supervision to cue for compensatory strategies Compensations: Minimize environmental distractions;Slow rate;Small sips/bites;Follow solids with liquid;Clear throat intermittently Postural Changes and/or Swallow Maneuvers: Seated upright 90 degrees;Upright 30-60 min after meal                       Oral Care Recommendations: Oral care QID;Staff/trained caregiver to provide oral care Follow up Recommendations: None SLP Visit Diagnosis: Dysphagia, unspecified (R13.10) Plan: Continue with current plan of care       Amelia H. Roddie Mc, CCC-SLP Speech Language Pathologist                 Wende Bushy 11/05/2017, 6:45 PM

## 2017-11-05 NOTE — Progress Notes (Signed)
Subjective: Interval History: has no complaint of nausea or vomiting.  Patient at this moment feels better..  Objective: Vital signs in last 24 hours: Temp:  [98 F (36.7 C)-98.1 F (36.7 C)] 98.1 F (36.7 C) (02/07 0434) Pulse Rate:  [81-88] 81 (02/07 0434) Resp:  [15-18] 18 (02/07 0434) BP: (129-185)/(63-68) 160/68 (02/07 0434) SpO2:  [96 %-100 %] 97 % (02/07 0745) Weight:  [86 kg (189 lb 9.5 oz)] 86 kg (189 lb 9.5 oz) (02/07 0434) Weight change: 1.5 kg (3 lb 4.9 oz)  Intake/Output from previous day: 02/06 0701 - 02/07 0700 In: 480 [P.O.:480] Out: 650 [Urine:650] Intake/Output this shift: Total I/O In: 186 [IV Piggyback:186] Out: -   General appearance: alert, cooperative and no distress Resp: diminished breath sounds bilaterally Cardio: regular rate and rhythm Extremities: No edema  Lab Results: Recent Labs    11/03/17 0208 11/03/17 0801 11/04/17 0442  WBC 18.2*  --  10.8*  HGB 8.8* 8.5* 10.1*  HCT 27.9* 27.6* 32.1*  PLT 279  --  272   BMET:  Recent Labs    11/04/17 0442 11/05/17 0658  NA 134* 133*  K 4.4 4.2  CL 101 98*  CO2 18* 20*  GLUCOSE 235* 146*  BUN 100* 116*  CREATININE 4.40* 4.53*  CALCIUM 8.8* 8.8*   No results for input(s): PTH in the last 72 hours. Iron Studies:  Recent Labs    11/03/17 1432  IRON 12*  TIBC 104*  FERRITIN 752*    Studies/Results: US Abdomen Complete  Result Date: 11/03/2017 CLINICAL DATA:  Emesis for 3 days and intermittent fever. Chronic abdominal pain. EXAM: ABDOMEN ULTRASOUND COMPLETE COMPARISON:  Abdominal CT 11/02/2017 FINDINGS: Gallbladder: Small amount of echogenic sludge within the gallbladder. No gallbladder wall distention or wall thickening. Reportedly, the patient does not have a sonographic Murphy sign. No gallstones. Common bile duct: Diameter: 0.4 cm. Liver: No focal lesion identified. Within normal limits in parenchymal echogenicity. Portal vein is patent on color Doppler imaging with normal direction  of blood flow towards the liver. IVC: No abnormality visualized. Pancreas: Visualized portion unremarkable. Spleen: Size and appearance within normal limits. Right Kidney: Length: 11.6 cm. Increased echogenicity in the right kidney without hydronephrosis. There is a indeterminate hypoechoic structure along the lateral upper pole region. This structure measures 2.6 x 2.4 x 2.8 cm. Structure may be partially cystic but cannot exclude a solid component. Review of the prior non contrast CT demonstrates minimal fullness in this area but there is not an obvious lesion based on the non contrast images. Left Kidney: Length: 10.1 cm. Echogenicity within normal limits. No mass or hydronephrosis visualized. Abdominal aorta: No aneurysm visualized. Other findings: None. IMPRESSION: Indeterminate right renal lesion measuring up to 2.8 cm. This could represent a complex cystic lesion but cannot exclude a solid component. This lesion is not well characterized on the prior non contrast CTs. Patient is not a candidate for contrast enhanced studies due to renal function but this lesion could be further characterized with a non contrast renal MRI. Gallbladder sludge without stones or biliary dilatation. Electronically Signed   By: Markus Daft M.D.   On: 11/03/2017 11:50    I have reviewed the patient's current medications.  Assessment/Plan: 1] chronic renal failure: Presently her BUN and creatinine is increasing.  Patient however denies any nausea or vomiting.  This could be secondary to fluid removal.  2] difficulty breathing: Possibly a combination of COPD and fluid overload.  Presently she is feeling much better. 3]  hypertension: Her blood pressure is reasonably controlled 4] diabetes 5] history of lupus 6] bone mineral disorder: Her calcium  is range but her phosphorus is high.  Patient presently on PhosLo. 7] anemia: Her hemoglobin remains within our target goal. Plan: We will increase PhosLo to 667 milligrams 2  tablets p.o. 3 times daily with meals. 2] since patient is a symptomatic we will hold dialysis. 3] we will check a renal panel. 4] if her renal function continue to decline we will consider initiating dialysis and then referring her back to Dr. Moshe Cipro with a primary nephrologist.  LOS: 3 days   [name] S 11/05/2017,10:46 AM

## 2017-11-05 NOTE — Progress Notes (Signed)
PROGRESS NOTE    [name]  QIW:[id number] DOB: [birth year] DOA: 11/02/2017 PCP: Sharilyn Sites, MD     Brief Narrative:  72 year old woman admitted from home on 2/4 due to worsening of chronic abdominal pain with nausea and vomiting.  In addition she had been complaining of a nonproductive cough and low-grade fevers.  She was found to be hypoxemic with worsening renal failure.  On 2/7 INR was found to be greater than 10.   Assessment & Plan:   Principal Problem:   Renal failure (ARF), acute on chronic (HCC) Active Problems:   Atrial flutter (HCC)   SLE (systemic lupus erythematosus) (HCC)   Essential hypertension, benign   COPD (chronic obstructive pulmonary disease) (HCC)   Anemia of chronic renal failure, stage 4 (severe) (HCC)   Acute respiratory failure with hypoxia (HCC)   Chronic diastolic heart failure (HCC)   Focal segmental glomerulosclerosis without nephrosis or chronic glomerulonephritis   Mixed hyperlipidemia   Type 2 diabetes mellitus with stage 4 chronic kidney disease (HCC)   Iron deficiency anemia   Community acquired pneumonia   Nausea and vomiting   AKI (acute kidney injury) ([place])   Acute renal failure superimposed on stage 4 chronic kidney disease (HCC)   COPD with acute exacerbation (HCC)   Acute hypoxemic respiratory failure -Due to COPD exacerbation and presumed aspiration pneumonia/lobar pneumonia. -Her respiratory virus panel has also resulted positive for RSV. -Continue Zosyn as her concern for aspiration given her nausea and vomiting prior to admission, okay to DC azithromycin especially given worsening INR. -She has been weaned off BiPAP. -Clinically improving although she continues to have an oxygen requirement which is new.  COPD with acute exacerbation -Continue nebs. -Continue steroids. -She appears to be clinically improving. -We will initiate steroid titration today.  Acute on chronic kidney disease stage IV -Appreciate  nephrology input and recommendations. -Creatinine continues to worsen, she already has AV fistula -Certainly she has no acute indications for hemodialysis but suspect she will need to start dialysis soon. -Has been started on oral bicarb tablets given metabolic acidosis due to advanced renal failure. -Renal biopsy in 2015 with collapsing FSGS plus severe arterial sclerosis with moderate to severe tubulointerstitial scarring and superimposed acute tubular injury.  UTI -UA with significant pyuria, unfortunately urine culture was not obtained on admission. -Zosyn should be sufficient coverage.  Abdominal pain/intractable nausea and vomiting -Has resolved, suspect viral gastroenteritis.  Chronic diastolic heart failure -Clinically appears euvolemic and compensated. -Echo from 2/5 has been reviewed: EF 55-60% with no wall motion abnormalities and grade 2 diastolic dysfunction,, PA pressure of 63.  Paroxysmal atrial fibrillation -Currently in sinus rhythm, anticoagulated on warfarin. -Rate controlled on metoprolol. -INR greater than 10, no signs of bleeding, will give 5 mg of oral vitamin K today and repeat PT/INR in a.m.  Systemic lupus -Continue Plaquenil  Essential hypertension -Well-controlled, continue current management  Type 2 diabetes -Recent hemoglobin A1c of 5.6 demonstrates excellent control. -CBGs improving: 196, 192, 154 -Should continue to improve once steroids are titrated. -Improving with initiation of Levemir.   DVT prophylaxis: Warfarin Code Status: Full code Family Communication: Husband at bedside updated on plan of care and questions answered Disposition Plan: Husband has questions about patient's disposition after discharge given the layout of their house.  We will request PT evaluation and case management consultation for further recommendations.  Consultants:   Nephrology  Procedures:   Echo as above  Antimicrobials:  Anti-infectives (From admission,  onward)   Start  Dose/Rate Route Frequency Ordered Stop   11/03/17 2200  cefTRIAXone (ROCEPHIN) 1 g in dextrose 5 % 50 mL IVPB  Status:  Discontinued     1 g 100 mL/hr over 30 Minutes Intravenous Every 24 hours 11/02/17 2234 11/03/17 0712   11/03/17 2200  piperacillin-tazobactam (ZOSYN) IVPB 3.375 g     3.375 g 12.5 mL/hr over 240 Minutes Intravenous Every 12 hours 11/03/17 1006     11/03/17 1000  hydroxychloroquine (PLAQUENIL) tablet 300 mg    Comments:  Take 1 1/2 tablets daily     300 mg Oral Daily 11/02/17 2214     11/03/17 0900  piperacillin-tazobactam (ZOSYN) 2.25 g in dextrose 5 % 50 mL IVPB  Status:  Discontinued     2.25 g 100 mL/hr over 30 Minutes Intravenous Every 8 hours 11/03/17 0739 11/03/17 1005   11/03/17 0730  piperacillin-tazobactam (ZOSYN) IVPB 2.25 g  Status:  Discontinued     2.25 g 100 mL/hr over 30 Minutes Intravenous Every 6 hours 11/03/17 0712 11/03/17 0739   11/02/17 2300  azithromycin (ZITHROMAX) 500 mg in dextrose 5 % 250 mL IVPB  Status:  Discontinued     500 mg 250 mL/hr over 60 Minutes Intravenous Every 24 hours 11/02/17 2214 11/05/17 0940   11/02/17 2015  azithromycin (ZITHROMAX) 500 mg in dextrose 5 % 250 mL IVPB  Status:  Discontinued     500 mg 250 mL/hr over 60 Minutes Intravenous  Once 11/02/17 2004 11/02/17 2214   11/02/17 2015  cefTRIAXone (ROCEPHIN) 2 g in dextrose 5 % 50 mL IVPB     2 g 100 mL/hr over 30 Minutes Intravenous  Once 11/02/17 2004 11/02/17 2348       Subjective: Lying in bed, appears comfortable, wants solid food has only been receiving liquids since admission.  Has no complaints.  Objective: Vitals:   11/04/17 2048 11/05/17 0434 11/05/17 0745 11/05/17 1520  BP:  (!) 160/68  (!) 153/80  Pulse:  81  70  Resp:  18  18  Temp:  98.1 F (36.7 C)  98.1 F (36.7 C)  TempSrc:  Oral  Oral  SpO2: 100% 100% 97% 93%  Weight:  86 kg (189 lb 9.5 oz)    Height:        Intake/Output Summary (Last 24 hours) at 11/05/2017 1625 Last  data filed at 11/05/2017 1521 Gross per 24 hour  Intake 876 ml  Output 300 ml  Net 576 ml   Filed Weights   11/03/17 0639 11/04/17 0500 11/05/17 0434  Weight: 83.9 kg (184 lb 15.5 oz) 84.5 kg (186 lb 4.6 oz) 86 kg (189 lb 9.5 oz)    Examination:  General exam: Alert, awake, oriented x 3 Respiratory system: Clear to auscultation. Respiratory effort normal. Cardiovascular system:RRR. No murmurs, rubs, gallops. Gastrointestinal system: Abdomen is nondistended, soft and nontender. No organomegaly or masses felt. Normal bowel sounds heard. Central nervous system: Alert and oriented. No focal neurological deficits. Extremities: No C/C/E, +pedal pulses Skin: No rashes, lesions or ulcers Psychiatry: Judgement and insight appear normal. Mood & affect appropriate.      Data Reviewed: I have personally reviewed following labs and imaging studies  CBC: Recent Labs  Lab 11/02/17 1536 11/03/17 0208 11/03/17 0801 11/04/17 0442  WBC 17.7* 18.2*  --  10.8*  NEUTROABS 16.3*  --   --   --   HGB 10.1* 8.8* 8.5* 10.1*  HCT 32.2* 27.9* 27.6* 32.1*  MCV 87.5 86.9  --  87.7  PLT  262 279  --  366   Basic Metabolic Panel: Recent Labs  Lab 11/02/17 1536 11/03/17 0208 11/04/17 0442 11/05/17 0658  NA 136 134* 134* 133*  K 3.9 4.2 4.4 4.2  CL 105 103 101 98*  CO2 18* 18* 18* 20*  GLUCOSE 70 235* 235* 146*  BUN 90* 89* 100* 116*  CREATININE 4.00* 3.97* 4.40* 4.53*  CALCIUM 8.8* 8.6* 8.8* 8.8*  PHOS  --   --  7.5* 7.6*   GFR: Estimated Creatinine Clearance: 12.1 mL/min (A) (by C-G formula based on SCr of 4.53 mg/dL (H)). Liver Function Tests: Recent Labs  Lab 11/02/17 1536 11/04/17 0442 11/05/17 0658  AST 37  --   --   ALT 27  --   --   ALKPHOS 108  --   --   BILITOT 0.6  --   --   PROT 7.4  --   --   ALBUMIN 2.3* 2.1* 2.1*   Recent Labs  Lab 11/02/17 1536  LIPASE 20   No results for input(s): AMMONIA in the last 168 hours. Coagulation Profile: Recent Labs  Lab  11/02/17 1537 11/03/17 0208 11/04/17 0442 11/05/17 0540  INR 2.21 2.56 4.74* >10.00*   Cardiac Enzymes: Recent Labs  Lab 11/02/17 1536 11/02/17 2011 11/03/17 0208 11/03/17 0801 11/03/17 1432  TROPONINI 0.09* 0.08* 0.09* 0.09* 0.08*   BNP (last 3 results) No results for input(s): PROBNP in the last 8760 hours. HbA1C: No results for input(s): HGBA1C in the last 72 hours. CBG: Recent Labs  Lab 11/04/17 2337 11/05/17 0433 11/05/17 0731 11/05/17 1144 11/05/17 1615  GLUCAP 253* 152* 154* 192* 196*   Lipid Profile: No results for input(s): CHOL, HDL, LDLCALC, TRIG, CHOLHDL, LDLDIRECT in the last 72 hours. Thyroid Function Tests: No results for input(s): TSH, T4TOTAL, FREET4, T3FREE, THYROIDAB in the last 72 hours. Anemia Panel: Recent Labs    11/03/17 1432  FERRITIN 752*  TIBC 104*  IRON 12*   Urine analysis:    Component Value Date/Time   COLORURINE YELLOW 11/02/2017 1527   APPEARANCEUR CLOUDY (A) 11/02/2017 1527   LABSPEC 1.010 11/02/2017 1527   PHURINE 8.0 11/02/2017 1527   GLUCOSEU NEGATIVE 11/02/2017 1527   HGBUR NEGATIVE 11/02/2017 1527   BILIRUBINUR NEGATIVE 11/02/2017 1527   KETONESUR NEGATIVE 11/02/2017 1527   PROTEINUR 100 (A) 11/02/2017 1527   UROBILINOGEN 1.0 03/17/2014 1830   NITRITE NEGATIVE 11/02/2017 1527   LEUKOCYTESUR LARGE (A) 11/02/2017 1527   Sepsis Labs: @LABRCNTIP (procalcitonin:4,lacticidven:4)  ) Recent Results (from the past 240 hour(s))  Culture, blood (Routine X 2) w Reflex to ID Panel     Status: None (Preliminary result)   Collection Time: 11/02/17  8:11 PM  Result Value Ref Range Status   Specimen Description BLOOD RIGHT FOREARM  Final   Special Requests   Final    BOTTLES DRAWN AEROBIC ONLY Blood Culture adequate volume   Culture   Final    NO GROWTH 3 DAYS Performed at [facility], [address] [place], [place] [address]    Report Status PENDING  Incomplete  Culture, blood (Routine X 2) w Reflex to ID Panel      Status: None (Preliminary result)   Collection Time: 11/02/17  8:11 PM  Result Value Ref Range Status   Specimen Description BLOOD RIGHT WRIST  Final   Special Requests   Final    BOTTLES DRAWN AEROBIC ONLY Blood Culture adequate volume   Culture   Final    NO GROWTH 3 DAYS  Performed at [facility], [address], [place], [place] [address]    Report Status PENDING  Incomplete  Respiratory Panel by PCR     Status: Abnormal   Collection Time: 11/02/17 10:13 PM  Result Value Ref Range Status   Adenovirus NOT DETECTED NOT DETECTED Final   Coronavirus 229E NOT DETECTED NOT DETECTED Final   Coronavirus HKU1 NOT DETECTED NOT DETECTED Final   Coronavirus NL63 NOT DETECTED NOT DETECTED Final   Coronavirus OC43 NOT DETECTED NOT DETECTED Final   Metapneumovirus NOT DETECTED NOT DETECTED Final   Rhinovirus / Enterovirus NOT DETECTED NOT DETECTED Final   Influenza A NOT DETECTED NOT DETECTED Final   Influenza B NOT DETECTED NOT DETECTED Final   Parainfluenza Virus 1 NOT DETECTED NOT DETECTED Final   Parainfluenza Virus 2 NOT DETECTED NOT DETECTED Final   Parainfluenza Virus 3 NOT DETECTED NOT DETECTED Final   Parainfluenza Virus 4 NOT DETECTED NOT DETECTED Final   Respiratory Syncytial Virus DETECTED (A) NOT DETECTED Final    Comment: CRITICAL RESULT CALLED TO, READ BACK BY AND VERIFIED WITH: RN A HOBBS [id number] MLM    Bordetella pertussis NOT DETECTED NOT DETECTED Final   Chlamydophila pneumoniae NOT DETECTED NOT DETECTED Final   Mycoplasma pneumoniae NOT DETECTED NOT DETECTED Final    Comment: Performed at [facility], [place] [address], [place], [place] [address]  MRSA PCR Screening     Status: None   Collection Time: 11/03/17 10:00 AM  Result Value Ref Range Status   MRSA by PCR NEGATIVE NEGATIVE Final    Comment:        The GeneXpert MRSA Assay (FDA approved for NASAL specimens only), is one component of a comprehensive MRSA colonization surveillance program. It is  not intended to diagnose MRSA infection nor to guide or monitor treatment for MRSA infections. Performed at [facility], [address], [place], [place] [address]          Radiology Studies: No results found.      Scheduled Meds: . atorvastatin  40 mg Oral QHS  . brimonidine  1 drop Left Eye Daily   And  . timolol  1 drop Left Eye Daily  . calcitRIOL  0.25 mcg Oral Once per day on Mon Wed Fri  . calcium acetate  1,334 mg Oral TID WC  . febuxostat  40 mg Oral Daily  . feeding supplement  1 Container Oral TID BM  . folic acid  1 mg Intravenous Daily  . hydroxychloroquine  300 mg Oral Daily  . insulin aspart  0-9 Units Subcutaneous Q4H  . insulin detemir  10 Units Subcutaneous QHS  . ipratropium-albuterol  3 mL Nebulization TID  . ketorolac  1 drop Left Eye Daily  . latanoprost  1 drop Left Eye QHS  . methylPREDNISolone (SOLU-MEDROL) injection  60 mg Intravenous Q8H  . metoprolol tartrate  2.5 mg Intravenous Q6H  . pantoprazole (PROTONIX) IV  40 mg Intravenous Q24H  . phytonadione  5 mg Oral Once  . sodium bicarbonate  650 mg Oral TID  . Warfarin - Pharmacist Dosing Inpatient   Does not apply Q24H   Continuous Infusions: . furosemide Stopped (11/05/17 1015)  . piperacillin-tazobactam (ZOSYN)  IV Stopped (11/05/17 1514)     LOS: 3 days    Time spent: 25 minutes.    Lelon Frohlich, MD [facility] Pager [phone]  If 7PM-7AM, please contact night-coverage [email or web address] Password [email or web address] 11/05/2017, 4:25 PM

## 2017-11-05 NOTE — Progress Notes (Signed)
PT Cancellation Note  Patient Details Name: [name] MRN: [id number] DOB: May 03, 1946   Cancelled Treatment:    Reason Eval/Treat Not Completed: Medical issues which prohibited therapy.  Pt has INR of 10 and will ck later as time and pt allow.   Ramond Dial 11/05/2017, 9:23 AM   Mee Hives, PT MS Acute Rehab Dept. Number: [place]

## 2017-11-06 LAB — CBC
HCT: 32.4 % — ABNORMAL LOW (ref 36.0–46.0)
HEMOGLOBIN: 10.1 g/dL — AB (ref 12.0–15.0)
MCH: 26.9 pg (ref 26.0–34.0)
MCHC: 31.2 g/dL (ref 30.0–36.0)
MCV: 86.2 fL (ref 78.0–100.0)
Platelets: 368 10*3/uL (ref 150–400)
RBC: 3.76 MIL/uL — AB (ref 3.87–5.11)
RDW: 18.1 % — ABNORMAL HIGH (ref 11.5–15.5)
WBC: 16.5 10*3/uL — ABNORMAL HIGH (ref 4.0–10.5)

## 2017-11-06 LAB — RENAL FUNCTION PANEL
ALBUMIN: 2 g/dL — AB (ref 3.5–5.0)
Anion gap: 15 (ref 5–15)
BUN: 128 mg/dL — AB (ref 6–20)
CALCIUM: 8.6 mg/dL — AB (ref 8.9–10.3)
CO2: 20 mmol/L — ABNORMAL LOW (ref 22–32)
CREATININE: 4.56 mg/dL — AB (ref 0.44–1.00)
Chloride: 99 mmol/L — ABNORMAL LOW (ref 101–111)
GFR, EST AFRICAN AMERICAN: 10 mL/min — AB (ref >60)
GFR, EST NON AFRICAN AMERICAN: 9 mL/min — AB (ref >60)
Glucose, Bld: 155 mg/dL — ABNORMAL HIGH (ref 65–99)
PHOSPHORUS: 7 mg/dL — AB (ref 2.5–4.6)
Potassium: 4.1 mmol/L (ref 3.5–5.1)
SODIUM: 134 mmol/L — AB (ref 135–145)

## 2017-11-06 LAB — PROTIME-INR
INR: 3.96
PROTHROMBIN TIME: 38.4 s — AB (ref 11.4–15.2)

## 2017-11-06 LAB — GLUCOSE, CAPILLARY
GLUCOSE-CAPILLARY: 190 mg/dL — AB (ref 65–99)
GLUCOSE-CAPILLARY: 198 mg/dL — AB (ref 65–99)
Glucose-Capillary: 126 mg/dL — ABNORMAL HIGH (ref 65–99)
Glucose-Capillary: 338 mg/dL — ABNORMAL HIGH (ref 65–99)
Glucose-Capillary: 401 mg/dL — ABNORMAL HIGH (ref 65–99)

## 2017-11-06 LAB — MAGNESIUM: MAGNESIUM: 2.4 mg/dL (ref 1.7–2.4)

## 2017-11-06 MED ORDER — TUBERCULIN PPD 5 UNIT/0.1ML ID SOLN
5.0000 [IU] | Freq: Once | INTRADERMAL | Status: AC
  Administered 2017-11-07: 5 [IU] via INTRADERMAL
  Filled 2017-11-06 (×2): qty 0.1

## 2017-11-06 MED ORDER — FOLIC ACID 1 MG PO TABS: 1.0000 mg | ORAL_TABLET | Freq: Every day | ORAL | Status: DC

## 2017-11-06 MED ORDER — PANTOPRAZOLE SODIUM 40 MG PO TBEC
40.0000 mg | DELAYED_RELEASE_TABLET | Freq: Every day | ORAL | Status: DC
  Administered 2017-11-07 – 2017-11-13 (×7): 40 mg via ORAL
  Filled 2017-11-06 (×7): qty 1

## 2017-11-06 MED ORDER — ALTEPLASE 2 MG IJ SOLR
2.0000 mg | Freq: Once | INTRAMUSCULAR | Status: DC | PRN
  Filled 2017-11-06: qty 2

## 2017-11-06 MED ORDER — PENTAFLUOROPROP-TETRAFLUOROETH EX AERO: 1.0000 "application " | INHALATION_SPRAY | CUTANEOUS | Status: DC | PRN

## 2017-11-06 MED ORDER — LIDOCAINE-PRILOCAINE 2.5-2.5 % EX CREA: 1.0000 "application " | TOPICAL_CREAM | CUTANEOUS | Status: DC | PRN

## 2017-11-06 MED ORDER — HEPARIN SODIUM (PORCINE) 1000 UNIT/ML DIALYSIS
1000.0000 [IU] | INTRAMUSCULAR | Status: DC | PRN
  Administered 2017-11-12: 3200 [IU] via INTRAVENOUS_CENTRAL
  Filled 2017-11-06 (×2): qty 1

## 2017-11-06 MED ORDER — SODIUM CHLORIDE 0.9 % IV SOLN: 100.0000 mL | INTRAVENOUS | Status: DC | PRN

## 2017-11-06 MED ORDER — METHYLPREDNISOLONE SODIUM SUCC 125 MG IJ SOLR
60.0000 mg | Freq: Every day | INTRAMUSCULAR | Status: DC
  Administered 2017-11-07 – 2017-11-09 (×4): 60 mg via INTRAVENOUS
  Filled 2017-11-06 (×4): qty 2

## 2017-11-06 MED ORDER — FOLIC ACID 1 MG PO TABS
1.0000 mg | ORAL_TABLET | Freq: Every day | ORAL | Status: DC
  Administered 2017-11-06 – 2017-11-13 (×8): 1 mg via ORAL
  Filled 2017-11-06 (×8): qty 1

## 2017-11-06 MED ORDER — LIDOCAINE HCL (PF) 1 % IJ SOLN: 5.0000 mL | INTRAMUSCULAR | Status: DC | PRN

## 2017-11-06 NOTE — Clinical Social Work Note (Signed)
Clinical Social Work Assessment  Patient Details  Name: [name] MRN: [id number] Date of Birth: 08-27-1946  Date of referral:  11/06/17               Reason for consult:  Facility Placement                Permission sought to share information with:    Permission granted to share information::     Name::        Agency::     Relationship::     Contact Information:  spouse, Mr. Virgo  Housing/Transportation Living arrangements for the past 2 months:  [place] of Information:  Patient, Spouse Patient Interpreter Needed:  None Criminal Activity/Legal Involvement Pertinent to Current Situation/Hospitalization:  No - Comment as needed Significant Relationships:  Spouse Lives with:  Spouse Do you feel safe going back to the place where you live?  Yes Need for family participation in patient care:  Yes (Comment)  Care giving concerns:  None identified at baseline.    Social Worker assessment / plan:  Patient lives in the home with her spouse in a split level home with the bedrooms and bathrooms being upstairs.  Patient, at baseline is independent. Mr. Shetterly is agreeable to SNF.   Employment status:  Part-Time Nurse, adult PT Recommendations:  [place] / Referral to community resources:  [place]  Patient/Family's Response to care:  Mr. Carbon is agreeable to SNF.   Patient/Family's Understanding of and Emotional Response to Diagnosis, Current Treatment, and Prognosis: Patient and family understand patient's diagnosis, treatment and prognosis.   Emotional Assessment Appearance:  Appears stated age Attitude/Demeanor/Rapport:    Affect (typically observed):  Accepting Orientation:  Oriented to Self, Oriented to Place, Oriented to  Time, Oriented to Situation Alcohol / Substance use:  Not Applicable Psych involvement (Current and /or in the community):  No  (Comment)  Discharge Needs  Concerns to be addressed:  Discharge Planning Concerns Readmission within the last 30 days:  No Current discharge risk:  None Barriers to Discharge:  Other(New dialysis start with [facility].)   Ihor Gully, LCSW 11/06/2017, 2:14 PM

## 2017-11-06 NOTE — Progress Notes (Addendum)
    HEMODIALYSIS NURSING NOTE:   Pt tearful about the prospect of starting dialysis. Had lengthy discussion with patient and her husband about indications for starting RRT, what to expect with HD, and access options.  Husband expressing concerns about pt's debilitation and inability to walk.  Pt cried silently as her husband also expressed financial concerns with regard to transportation to/from outpatient HD center.  Pt/spouse assured that ongoing discharge planning will address these concerns.  Unable to access left upper arm AVF.  Bruit is audible however a weak thrill is palpable only over the anastomosis.  Placed in July 2015 (per husband); should have arterialized by now.  Above discussed with Dr. Lowanda Foster who cancelled HD order for today and will follow up tomorrow.  Rockwell Alexandria, RN, CDN

## 2017-11-06 NOTE — Progress Notes (Signed)
Assisted to bedside commode and had bm.  C/o nausea this am but did not vomit.  Still has not decided to have dialysis

## 2017-11-06 NOTE — Progress Notes (Signed)
PROGRESS NOTE    [name]  TGG:[id number] DOB: 09/10/1946 DOA: 11/02/2017 PCP: Sharilyn Sites, MD     Brief Narrative:  72 year old woman admitted from home on 2/4 due to worsening of chronic abdominal pain with nausea and vomiting.  In addition she had been complaining of a nonproductive cough and low-grade fevers.  She was found to be hypoxemic with worsening renal failure.  On 2/7 INR was found to be greater than 10.  Has been started on dialysis as of 2/8.   Assessment & Plan:   Principal Problem:   Renal failure (ARF), acute on chronic (HCC) Active Problems:   Atrial flutter (HCC)   SLE (systemic lupus erythematosus) (HCC)   Essential hypertension, benign   COPD (chronic obstructive pulmonary disease) (HCC)   Anemia of chronic renal failure, stage 4 (severe) (HCC)   Acute respiratory failure with hypoxia (HCC)   Chronic diastolic heart failure (HCC)   Focal segmental glomerulosclerosis without nephrosis or chronic glomerulonephritis   Mixed hyperlipidemia   Type 2 diabetes mellitus with stage 4 chronic kidney disease (HCC)   Iron deficiency anemia   Community acquired pneumonia   Nausea and vomiting   AKI (acute kidney injury) ([place])   Acute renal failure superimposed on stage 4 chronic kidney disease (HCC)   COPD with acute exacerbation (HCC)   Acute hypoxemic respiratory failure -Due to COPD exacerbation and presumed aspiration pneumonia/lobar pneumonia. -Her respiratory virus panel has also resulted positive for RSV. -Continue Zosyn (planned for 7 days total of treatment with antibiotics) as her concern for aspiration given her nausea and vomiting prior to admission, okay to DC azithromycin especially given worsening INR. -She has been weaned off BiPAP. -Clinically improving although she continues to have an oxygen requirement which is new.  Should improve now with hemodialysis  COPD with acute exacerbation -Continue nebs. -Continue steroids; although  continue to titrate dose, down to Solu-Medrol 60 mg daily today. -She appears to be clinically improving.  Acute on chronic kidney disease stage IV -Progressing to end-stage renal disease this admission .-Appreciate nephrology input and recommendations. -Creatinine continues to worsen, she already has AV fistula -Due to continuing of worsening creatinine nephrology has decided to initiate dialysis today, will receive short treatment today and again tomorrow. -Has been started on oral bicarb tablets given metabolic acidosis due to advanced renal failure. -Renal biopsy in 2015 with collapsing FSGS plus severe arterial sclerosis with moderate to severe tubulointerstitial scarring and superimposed acute tubular injury.  UTI -UA with significant pyuria, unfortunately urine culture was not obtained on admission. -Zosyn should be sufficient coverage.  Abdominal pain/intractable nausea and vomiting -Has resolved, suspect viral gastroenteritis.  Chronic diastolic heart failure -Clinically appears euvolemic and compensated. -Echo from 2/5 has been reviewed: EF 55-60% with no wall motion abnormalities and grade 2 diastolic dysfunction,, PA pressure of 63.  Paroxysmal atrial fibrillation -Currently in sinus rhythm, anticoagulated on warfarin. -Rate controlled on metoprolol. -INR 3.96 today. -Received vitamin K on 2/7.Marland Kitchen  Systemic lupus -Continue Plaquenil  Essential hypertension -Well-controlled, continue current management  Type 2 diabetes -Recent hemoglobin A1c of 5.6 demonstrates excellent control. -CBGs improving. -Should continue to improve once steroids are titrated. -Improving with initiation of Levemir.   DVT prophylaxis: Warfarin Code Status: Full code Family Communication: Husband at bedside updated on plan of care and questions answered Disposition Plan: Husband has questions about patient's disposition after discharge given the layout of their house.  We will also need to  arrange outpatient slot she is newly  started on hemodialysis.  Consultants:   Nephrology  Procedures:   Echo as above  Antimicrobials:  Anti-infectives (From admission, onward)   Start     Dose/Rate Route Frequency Ordered Stop   11/03/17 2200  cefTRIAXone (ROCEPHIN) 1 g in dextrose 5 % 50 mL IVPB  Status:  Discontinued     1 g 100 mL/hr over 30 Minutes Intravenous Every 24 hours 11/02/17 2234 11/03/17 0712   11/03/17 2200  piperacillin-tazobactam (ZOSYN) IVPB 3.375 g     3.375 g 12.5 mL/hr over 240 Minutes Intravenous Every 12 hours 11/03/17 1006     11/03/17 1000  hydroxychloroquine (PLAQUENIL) tablet 300 mg    Comments:  Take 1 1/2 tablets daily     300 mg Oral Daily 11/02/17 2214     11/03/17 0900  piperacillin-tazobactam (ZOSYN) 2.25 g in dextrose 5 % 50 mL IVPB  Status:  Discontinued     2.25 g 100 mL/hr over 30 Minutes Intravenous Every 8 hours 11/03/17 0739 11/03/17 1005   11/03/17 0730  piperacillin-tazobactam (ZOSYN) IVPB 2.25 g  Status:  Discontinued     2.25 g 100 mL/hr over 30 Minutes Intravenous Every 6 hours 11/03/17 0712 11/03/17 0739   11/02/17 2300  azithromycin (ZITHROMAX) 500 mg in dextrose 5 % 250 mL IVPB  Status:  Discontinued     500 mg 250 mL/hr over 60 Minutes Intravenous Every 24 hours 11/02/17 2214 11/05/17 0940   11/02/17 2015  azithromycin (ZITHROMAX) 500 mg in dextrose 5 % 250 mL IVPB  Status:  Discontinued     500 mg 250 mL/hr over 60 Minutes Intravenous  Once 11/02/17 2004 11/02/17 2214   11/02/17 2015  cefTRIAXone (ROCEPHIN) 2 g in dextrose 5 % 50 mL IVPB     2 g 100 mL/hr over 30 Minutes Intravenous  Once 11/02/17 2004 11/02/17 2348       Subjective: In bed, comfortable, tearful about the prospect of starting on dialysis.  Objective: Vitals:   11/05/17 2250 11/06/17 0308 11/06/17 0744 11/06/17 1410  BP: (!) 162/59 (!) 176/64    Pulse: 81 80    Resp:      Temp: 98.2 F (36.8 C) 98.4 F (36.9 C)    TempSrc: Oral Oral    SpO2: 93%  94% 98% 91%  Weight:  85.9 kg (189 lb 6 oz)    Height:        Intake/Output Summary (Last 24 hours) at 11/06/2017 1820 Last data filed at 11/06/2017 1142 Gross per 24 hour  Intake 480 ml  Output 675 ml  Net -195 ml   Filed Weights   11/05/17 0434 11/05/17 1700 11/06/17 0308  Weight: 86 kg (189 lb 9.5 oz) 86.4 kg (190 lb 7.6 oz) 85.9 kg (189 lb 6 oz)    Examination:  General exam: Alert, awake, oriented x 3 Respiratory system: Clear to auscultation. Respiratory effort normal. Cardiovascular system:RRR. No murmurs, rubs, gallops. Gastrointestinal system: Abdomen is nondistended, soft and nontender. No organomegaly or masses felt. Normal bowel sounds heard. Central nervous system: Alert and oriented. No focal neurological deficits. Extremities: No C/C/E, +pedal pulses Skin: No rashes, lesions or ulcers Psychiatry: Judgement and insight appear normal. Mood & affect appropriate.       Data Reviewed: I have personally reviewed following labs and imaging studies  CBC: Recent Labs  Lab 11/02/17 1536 11/03/17 0208 11/03/17 0801 11/04/17 0442 11/06/17 1654  WBC 17.7* 18.2*  --  10.8* 16.5*  NEUTROABS 16.3*  --   --   --   --  HGB 10.1* 8.8* 8.5* 10.1* 10.1*  HCT 32.2* 27.9* 27.6* 32.1* 32.4*  MCV 87.5 86.9  --  87.7 86.2  PLT 262 279  --  272 509   Basic Metabolic Panel: Recent Labs  Lab 11/02/17 1536 11/03/17 0208 11/04/17 0442 11/05/17 0658 11/06/17 0547  NA 136 134* 134* 133* 134*  K 3.9 4.2 4.4 4.2 4.1  CL 105 103 101 98* 99*  CO2 18* 18* 18* 20* 20*  GLUCOSE 70 235* 235* 146* 155*  BUN 90* 89* 100* 116* 128*  CREATININE 4.00* 3.97* 4.40* 4.53* 4.56*  CALCIUM 8.8* 8.6* 8.8* 8.8* 8.6*  MG  --   --   --   --  2.4  PHOS  --   --  7.5* 7.6* 7.0*   GFR: Estimated Creatinine Clearance: 12 mL/min (A) (by C-G formula based on SCr of 4.56 mg/dL (H)). Liver Function Tests: Recent Labs  Lab 11/02/17 1536 11/04/17 0442 11/05/17 0658 11/06/17 0547  AST 37  --    --   --   ALT 27  --   --   --   ALKPHOS 108  --   --   --   BILITOT 0.6  --   --   --   PROT 7.4  --   --   --   ALBUMIN 2.3* 2.1* 2.1* 2.0*   Recent Labs  Lab 11/02/17 1536  LIPASE 20   No results for input(s): AMMONIA in the last 168 hours. Coagulation Profile: Recent Labs  Lab 11/02/17 1537 11/03/17 0208 11/04/17 0442 11/05/17 0540 11/06/17 0547  INR 2.21 2.56 4.74* >10.00* 3.96   Cardiac Enzymes: Recent Labs  Lab 11/02/17 1536 11/02/17 2011 11/03/17 0208 11/03/17 0801 11/03/17 1432  TROPONINI 0.09* 0.08* 0.09* 0.09* 0.08*   BNP (last 3 results) No results for input(s): PROBNP in the last 8760 hours. HbA1C: No results for input(s): HGBA1C in the last 72 hours. CBG: Recent Labs  Lab 11/05/17 2350 11/06/17 0443 11/06/17 0812 11/06/17 1154 11/06/17 1700  GLUCAP 220* 190* 126* 198* 338*   Lipid Profile: No results for input(s): CHOL, HDL, LDLCALC, TRIG, CHOLHDL, LDLDIRECT in the last 72 hours. Thyroid Function Tests: No results for input(s): TSH, T4TOTAL, FREET4, T3FREE, THYROIDAB in the last 72 hours. Anemia Panel: No results for input(s): VITAMINB12, FOLATE, FERRITIN, TIBC, IRON, RETICCTPCT in the last 72 hours. Urine analysis:    Component Value Date/Time   COLORURINE YELLOW 11/02/2017 1527   APPEARANCEUR CLOUDY (A) 11/02/2017 1527   LABSPEC 1.010 11/02/2017 1527   PHURINE 8.0 11/02/2017 1527   GLUCOSEU NEGATIVE 11/02/2017 1527   HGBUR NEGATIVE 11/02/2017 1527   BILIRUBINUR NEGATIVE 11/02/2017 1527   KETONESUR NEGATIVE 11/02/2017 1527   PROTEINUR 100 (A) 11/02/2017 1527   UROBILINOGEN 1.0 03/17/2014 1830   NITRITE NEGATIVE 11/02/2017 1527   LEUKOCYTESUR LARGE (A) 11/02/2017 1527   Sepsis Labs: @LABRCNTIP (procalcitonin:4,lacticidven:4)  ) Recent Results (from the past 240 hour(s))  Culture, blood (Routine X 2) w Reflex to ID Panel     Status: None (Preliminary result)   Collection Time: 11/02/17  8:11 PM  Result Value Ref Range Status    Specimen Description BLOOD RIGHT FOREARM  Final   Special Requests   Final    BOTTLES DRAWN AEROBIC ONLY Blood Culture adequate volume   Culture   Final    NO GROWTH 4 DAYS Performed at [facility], [address] [place], [place] [address]    Report Status PENDING  Incomplete  Culture, blood (Routine  X 2) w Reflex to ID Panel     Status: None (Preliminary result)   Collection Time: 11/02/17  8:11 PM  Result Value Ref Range Status   Specimen Description BLOOD RIGHT WRIST  Final   Special Requests   Final    BOTTLES DRAWN AEROBIC ONLY Blood Culture adequate volume   Culture   Final    NO GROWTH 4 DAYS Performed at [facility], [address] [place], [place] [address]    Report Status PENDING  Incomplete  Respiratory Panel by PCR     Status: Abnormal   Collection Time: 11/02/17 10:13 PM  Result Value Ref Range Status   Adenovirus NOT DETECTED NOT DETECTED Final   Coronavirus 229E NOT DETECTED NOT DETECTED Final   Coronavirus HKU1 NOT DETECTED NOT DETECTED Final   Coronavirus NL63 NOT DETECTED NOT DETECTED Final   Coronavirus OC43 NOT DETECTED NOT DETECTED Final   Metapneumovirus NOT DETECTED NOT DETECTED Final   Rhinovirus / Enterovirus NOT DETECTED NOT DETECTED Final   Influenza A NOT DETECTED NOT DETECTED Final   Influenza B NOT DETECTED NOT DETECTED Final   Parainfluenza Virus 1 NOT DETECTED NOT DETECTED Final   Parainfluenza Virus 2 NOT DETECTED NOT DETECTED Final   Parainfluenza Virus 3 NOT DETECTED NOT DETECTED Final   Parainfluenza Virus 4 NOT DETECTED NOT DETECTED Final   Respiratory Syncytial Virus DETECTED (A) NOT DETECTED Final    Comment: CRITICAL RESULT CALLED TO, READ BACK BY AND VERIFIED WITH: RN A HOBBS [phone]    Bordetella pertussis NOT DETECTED NOT DETECTED Final   Chlamydophila pneumoniae NOT DETECTED NOT DETECTED Final   Mycoplasma pneumoniae NOT DETECTED NOT DETECTED Final    Comment: Performed at [facility], [place] [address]  [place], [place] [address]  MRSA PCR Screening     Status: None   Collection Time: 11/03/17 10:00 AM  Result Value Ref Range Status   MRSA by PCR NEGATIVE NEGATIVE Final    Comment:        The GeneXpert MRSA Assay (FDA approved for NASAL specimens only), is one component of a comprehensive MRSA colonization surveillance program. It is not intended to diagnose MRSA infection nor to guide or monitor treatment for MRSA infections. Performed at [facility], [address] [place], [place] [address]          Radiology Studies: No results found.      Scheduled Meds: . atorvastatin  40 mg Oral QHS  . brimonidine  1 drop Left Eye Daily   And  . timolol  1 drop Left Eye Daily  . calcitRIOL  0.25 mcg Oral Once per day on Mon Wed Fri  . calcium acetate  1,334 mg Oral TID WC  . febuxostat  40 mg Oral Daily  . feeding supplement  1 Container Oral TID BM  . folic acid  1 mg Oral Daily  . hydroxychloroquine  300 mg Oral Daily  . insulin aspart  0-9 Units Subcutaneous Q4H  . insulin detemir  10 Units Subcutaneous QHS  . ipratropium-albuterol  3 mL Nebulization TID  . ketorolac  1 drop Left Eye Daily  . latanoprost  1 drop Left Eye QHS  . methylPREDNISolone (SOLU-MEDROL) injection  60 mg Intravenous Q12H  . metoprolol tartrate  2.5 mg Intravenous Q6H  . [START ON 11/07/2017] pantoprazole  40 mg Oral Daily  . sodium bicarbonate  650 mg Oral TID  . tuberculin  5 Units Intradermal Once  . Warfarin -  Pharmacist Dosing Inpatient   Does not apply Q24H   Continuous Infusions: . sodium chloride    . sodium chloride    . furosemide Stopped (11/06/17 1558)  . piperacillin-tazobactam (ZOSYN)  IV Stopped (11/06/17 1445)     LOS: 4 days    Time spent: 25 minutes.    Lelon Frohlich, MD [facility] Pager [phone]  If 7PM-7AM, please contact night-coverage [email or web address] Password [email or web address] 11/06/2017, 6:20 PM

## 2017-11-06 NOTE — NC FL2 (Signed)
[place] LEVEL OF CARE SCREENING TOOL     IDENTIFICATION  Patient Name: [name] Birthdate: 02-27-1946 Sex: female Admission Date (Current Location): 11/02/2017  [facility] and [place] Number:  [employer] and Address:  [place] [address] [place]      Provider Number: [id number]  Attending Physician Name and Address:  Isaac Bliss, Olam Idler*  Relative Name and Phone Number:       Current Level of Care: Hospital Recommended Level of Care: [place] Prior Approval Number:    Date Approved/Denied:   PASRR Number: [id number]  Discharge Plan: SNF    Current Diagnoses: Patient Active Problem List   Diagnosis Date Noted  . Acute renal failure superimposed on stage 4 chronic kidney disease ([place]) 11/03/2017  . COPD with acute exacerbation ([place]) 11/03/2017  . Community acquired pneumonia 11/02/2017  . Nausea and vomiting 11/02/2017  . AKI (acute kidney injury) ([place]) 11/02/2017  . Iron deficiency anemia 08/07/2017  . Heme positive stool 10/04/2015  . Mixed hyperlipidemia 06/29/2015  . Type 2 diabetes mellitus with stage 4 chronic kidney disease ([place]) 06/29/2015  . Encounter for adequacy testing for hemodialysis ([place]) 04/12/2014  . Chronic kidney disease, stage V ([place]) 04/12/2014  . 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 Encounter for therapeutic drug monitoring 03/30/2014  . Acute respiratory failure with hypoxia ([place]) 03/15/2014  . Chronic diastolic heart failure ([place]) 03/15/2014  . Renal failure (ARF), acute on chronic (HCC) 03/13/2014  . Anemia of chronic renal failure, stage 4 (severe) ([place]) 02/23/2014  . Folate deficiency 02/17/2014  . Aortic regurgitation 09/19/2012  . Atrial flutter ([place]) 09/14/2012  . Class 2 severe obesity due to excess calories with serious comorbidity and body mass index (BMI) of 35.0 to 35.9 in adult ([place])  09/14/2012  . Type 2 diabetes mellitus with diabetic neuropathy ([place]) 09/14/2012  . SLE (systemic lupus erythematosus) ([place]) 09/14/2012  . Essential hypertension, benign 09/14/2012  . COPD (chronic obstructive pulmonary disease) ([place]) 09/14/2012    Orientation RESPIRATION BLADDER Height & Weight     Self, Time, Situation, Place  O2(2L) Continent Weight: 189 lb 6 oz (85.9 kg) Height:  5\' 4"  (162.6 cm)  BEHAVIORAL SYMPTOMS/MOOD NEUROLOGICAL BOWEL NUTRITION STATUS      Continent Diet(Diet DYS2)  AMBULATORY STATUS COMMUNICATION OF NEEDS Skin   Extensive Assist Verbally Other (Comment)(Wound incision: leg, rt, posterier; left leg)                       Personal Care Assistance Level of Assistance  Bathing, Dressing, Feeding Bathing Assistance: Limited assistance Feeding assistance: Independent Dressing Assistance: Limited assistance     Functional Limitations Info  Sight, Hearing, Speech Sight Info: Adequate Hearing Info: Adequate Speech Info: Adequate    SPECIAL CARE FACTORS FREQUENCY  PT (By licensed PT)     PT Frequency: 5x/weel              Contractures Contractures Info: Not present    Additional Factors Info  Code Status, Allergies Code Status Info: Full Code Allergies Info: Ace Inhibitors           Current Medications (11/06/2017):  This is the current hospital active medication list Current Facility-Administered Medications  Medication Dose Route Frequency Provider Last Rate Last Dose  . 0.9 %  sodium chloride infusion  100 mL Intravenous PRN Fran Lowes, MD      . 0.9 %  sodium  chloride infusion  100 mL Intravenous PRN Fran Lowes, MD      . acetaminophen (TYLENOL) suppository 650 mg  650 mg Rectal Q4H PRN Tat, Shanon Brow, MD      . acetaminophen (TYLENOL) tablet 1,000 mg  1,000 mg Oral Q6H PRN Manuella Ghazi, Pratik D, DO      . alteplase (CATHFLO ACTIVASE) injection 2 mg  2 mg Intracatheter Once PRN Fran Lowes, MD      . atorvastatin  (LIPITOR) tablet 40 mg  40 mg Oral QHS Shah, Pratik D, DO   40 mg at 11/05/17 2134  . brimonidine (ALPHAGAN) 0.2 % ophthalmic solution 1 drop  1 drop Left Eye Daily Tat, David, MD   1 drop at 11/06/17 1059   And  . timolol (TIMOPTIC) 0.5 % ophthalmic solution 1 drop  1 drop Left Eye Daily Tat, David, MD   1 drop at 11/06/17 1047  . calcitRIOL (ROCALTROL) capsule 0.25 mcg  0.25 mcg Oral Once per day on Mon Wed Fri Manuella Ghazi, Pratik D, DO   0.25 mcg at 11/04/17 0086  . calcium acetate (PHOSLO) capsule 1,334 mg  1,334 mg Oral TID WC Fran Lowes, MD   1,334 mg at 11/06/17 1046  . febuxostat (ULORIC) tablet 40 mg  40 mg Oral Daily Manuella Ghazi, Pratik D, DO   40 mg at 11/06/17 1047  . feeding supplement (BOOST / RESOURCE BREEZE) liquid 1 Container  1 Container Oral TID BM Orson Eva, MD   1 Container at 11/05/17 1014  . folic acid (FOLVITE) tablet 1 mg  1 mg Oral Daily Isaac Bliss, Rayford Halsted, MD      . furosemide (LASIX) 120 mg in dextrose 5 % 50 mL IVPB  120 mg Intravenous BID Fran Lowes, MD 62 mL/hr at 11/05/17 1709 120 mg at 11/05/17 1709  . heparin injection 1,000 Units  1,000 Units Dialysis PRN Fran Lowes, MD      . hydrALAZINE (APRESOLINE) injection 5 mg  5 mg Intravenous Q6H PRN Tat, Shanon Brow, MD      . HYDROmorphone (DILAUDID) injection 0.5 mg  0.5 mg Intravenous Q4H PRN Orson Eva, MD   0.5 mg at 11/05/17 2140  . hydroxychloroquine (PLAQUENIL) tablet 300 mg  300 mg Oral Daily Manuella Ghazi, Pratik D, DO   300 mg at 11/06/17 1046  . insulin aspart (novoLOG) injection 0-9 Units  0-9 Units Subcutaneous Q4H Shah, Pratik D, DO   3 Units at 11/06/17 1237  . insulin detemir (LEVEMIR) injection 10 Units  10 Units Subcutaneous QHS Isaac Bliss, Rayford Halsted, MD   10 Units at 11/05/17 2135  . ipratropium-albuterol (DUONEB) 0.5-2.5 (3) MG/3ML nebulizer solution 3 mL  3 mL Nebulization Q6H PRN Manuella Ghazi, Pratik D, DO   3 mL at 11/03/17 0233  . ipratropium-albuterol (DUONEB) 0.5-2.5 (3) MG/3ML nebulizer  solution 3 mL  3 mL Nebulization TID Tat, David, MD   3 mL at 11/06/17 1410  . ketorolac (ACULAR) 0.5 % ophthalmic solution 1 drop  1 drop Left Eye Daily Manuella Ghazi, Pratik D, DO   1 drop at 11/06/17 1047  . latanoprost (XALATAN) 0.005 % ophthalmic solution 1 drop  1 drop Left Eye QHS Shah, Pratik D, DO   1 drop at 11/05/17 2134  . lidocaine (PF) (XYLOCAINE) 1 % injection 5 mL  5 mL Intradermal PRN Fran Lowes, MD      . lidocaine-prilocaine (EMLA) cream 1 application  1 application Topical PRN Fran Lowes, MD      . methylPREDNISolone sodium succinate (SOLU-MEDROL)  125 mg/2 mL injection 60 mg  60 mg Intravenous Q12H Isaac Bliss, Rayford Halsted, MD   60 mg at 11/06/17 2706  . metoprolol tartrate (LOPRESSOR) injection 2.5 mg  2.5 mg Intravenous Q6H Orson Eva, MD   2.5 mg at 11/06/17 1238  . ondansetron (ZOFRAN) tablet 4 mg  4 mg Oral Q6H PRN Manuella Ghazi, Pratik D, DO       Or  . ondansetron (ZOFRAN) injection 4 mg  4 mg Intravenous Q6H PRN Manuella Ghazi, Pratik D, DO   4 mg at 11/03/17 1353  . [START ON 11/07/2017] pantoprazole (PROTONIX) EC tablet 40 mg  40 mg Oral Daily Isaac Bliss, Rayford Halsted, MD      . pentafluoroprop-tetrafluoroeth (GEBAUERS) aerosol 1 application  1 application Topical PRN Fran Lowes, MD      . piperacillin-tazobactam (ZOSYN) IVPB 3.375 g  3.375 g Intravenous Q12H Orson Eva, MD 12.5 mL/hr at 11/06/17 1045 3.375 g at 11/06/17 1045  . sodium bicarbonate tablet 650 mg  650 mg Oral TID Liana Gerold, MD   650 mg at 11/06/17 1000  . tuberculin injection 5 Units  5 Units Intradermal Once Fran Lowes, MD      . Warfarin - Pharmacist Dosing Inpatient   Does not apply Q24H Heath Lark D, DO         Discharge Medications: Please see discharge summary for a list of discharge medications.  Relevant Imaging Results:  Relevant Lab Results:   Additional Information SSN [id number]. Patient will be a new start to dialysis at [facility].  Dayani Winbush, Clydene Pugh,  LCSW

## 2017-11-06 NOTE — Progress Notes (Signed)
Subjective: Interval History: Patient states that she is feeling somewhat better.  She did not have any nausea but appetite is poor.  She complains of constipation.  Objective: Vital signs in last 24 hours: Temp:  [98.1 F (36.7 C)-98.4 F (36.9 C)] 98.4 F (36.9 C) (02/08 0308) Pulse Rate:  [70-81] 80 (02/08 0308) Resp:  [18] 18 (02/07 1520) BP: (153-176)/(59-80) 176/64 (02/08 0308) SpO2:  [93 %-99 %] 98 % (02/08 0744) Weight:  [85.9 kg (189 lb 6 oz)-86.4 kg (190 lb 7.6 oz)] 85.9 kg (189 lb 6 oz) (02/08 0308) Weight change: 0.4 kg (14.1 oz)  Intake/Output from previous day: 02/07 0701 - 02/08 0700 In: 756 [P.O.:420; IV Piggyback:336] Out: 675 [Urine:675] Intake/Output this shift: No intake/output data recorded.  General appearance: alert, cooperative and no distress Resp: diminished breath sounds bilaterally Cardio: regular rate and rhythm Extremities: No edema  Lab Results: Recent Labs    11/04/17 0442  WBC 10.8*  HGB 10.1*  HCT 32.1*  PLT 272   BMET:  Recent Labs    11/05/17 0658 11/06/17 0547  NA 133* 134*  K 4.2 4.1  CL 98* 99*  CO2 20* 20*  GLUCOSE 146* 155*  BUN 116* 128*  CREATININE 4.53* 4.56*  CALCIUM 8.8* 8.6*   No results for input(s): PTH in the last 72 hours. Iron Studies:  Recent Labs    11/03/17 1432  IRON 12*  TIBC 104*  FERRITIN 752*    Studies/Results: No results found.  I have reviewed the patient's current medications.  Assessment/Plan: 1] chronic renal failure: Her potassium is normal but her BUN and creatinine continue to increase. 2] difficulty breathing: Possibly a combination of COPD and fluid overload.  She is slightly better. 3] hypertension: Her blood pressure is reasonably controlled 4] diabetes: Blood sugar is reasonably controlled 5] history of lupus 6] bone mineral disorder: Her calcium  is range but her phosphorus is high.  Patient is on a binder. 7] anemia: Her hemoglobin remains within our target goal. Plan:  1] because of progressive worsening of her renal failure will initiate hemodialysis today for 2 hours.   I have discussed with the patient and she has agreed. 2]  will put PPD and check hepatitis B surface antigen. 3] we will check a renal panel. 4] on discharge patient will be going to [facility] outpatient dialysis unit. 5] will make another dialysis tomorrow  LOS: 4 days   Kenslee Achorn S 11/06/2017,8:51 AM

## 2017-11-06 NOTE — Evaluation (Signed)
Physical Therapy Evaluation Patient Details Name: [name] MRN: [id number] DOB: [birth year] Today's Date: 11/06/2017   History of Present Illness  [name] is a 72 y.o. female with medical history significant for CKD stage IV with fistula placement to left arm, chronic diastolic heart failure, iron deficiency anemia, atrial fibrillation on anticoagulation with warfarin, COPD, hypertension, type 2 diabetes, and SLE who presents to the emergency department after being told by her primary care provider that she needs to be further evaluated for worsening acute on chronic abdominal pain with nausea and vomiting over the last 2-3 days along with cough and fever that began approximately at the same time.  She states that she has had very poor oral intake on account of her nausea and vomiting and has also had some mild diarrhea.  She denies any blood in her emesis or stools and states that the abdominal pain is the same as her usual pain that has been ongoing now for the last 6 months.  She has been referred to gastroenterology with an appointment set on the 14th and has had prior CT scans which have been unrevealing.  She also has chronic wounds to her right leg which is being managed with wound care at home.  She denies any chest pain, shortness of breath, or other symptomatology and states that her nausea is somewhat feeling better and she would like something to eat.    Clinical Impression  Patient very unsteady on feet and limited to 3-4 steps to transfer to chair secondary to generalized weakness, poor standing balance and c/o fatigue.  Patient will benefit from continued physical therapy in hospital and recommended venue below to increase strength, balance, endurance for safe ADLs and gait.    Follow Up Recommendations SNF    Equipment Recommendations  None recommended by PT    Recommendations for Other Services       Precautions / Restrictions Precautions Precautions:  Fall Restrictions Weight Bearing Restrictions: No      Mobility  Bed Mobility Overal bed mobility: Needs Assistance Bed Mobility: Supine to Sit     Supine to sit: Mod assist        Transfers Overall transfer level: Needs assistance Equipment used: Rolling walker (2 wheeled) Transfers: Sit to/from [facility] Sit to Stand: Min assist;Mod assist Stand pivot transfers: Mod assist       General transfer comment: slow unsteady movement  Ambulation/Gait Ambulation/Gait assistance: Mod assist;Max assist Ambulation Distance (Feet): 3 Feet Assistive device: Rolling walker (2 wheeled) Gait Pattern/deviations: Decreased step length - right;Decreased step length - left;Decreased stride length   Gait velocity interpretation: Below normal speed for age/gender General Gait Details: limited to 3-4 steps due to poor standing balance, BLE weakness  Stairs            Wheelchair Mobility    Modified Rankin (Stroke Patients Only)       Balance Overall balance assessment: Needs assistance Sitting-balance support: Feet supported;No upper extremity supported Sitting balance-Leahy Scale: Fair     Standing balance support: Bilateral upper extremity supported;During functional activity Standing balance-Leahy Scale: Poor Standing balance comment: fair/poor with RW                             Pertinent Vitals/Pain Pain Assessment: Faces Faces Pain Scale: Hurts a little bit Pain Location: right flank (right side of lower abdomen) Pain Intervention(s): Limited activity within patient's tolerance;Monitored during session  Home Living Family/patient expects to be discharged to:: Private residence Living Arrangements: Spouse/significant other Available Help at Discharge: Family Type of Home: House Home Access: Stairs to enter Entrance Stairs-Rails: Right Entrance Stairs-Number of Steps: 3 Home Layout: Two level Seth Ward - single  point;Walker - 2 wheels;Wheelchair - manual;Shower seat;Bedside commode      Prior Function Level of Independence: Independent               Hand Dominance        Extremity/Trunk Assessment   Upper Extremity Assessment Upper Extremity Assessment: Generalized weakness    Lower Extremity Assessment Lower Extremity Assessment: Generalized weakness    Cervical / Trunk Assessment Cervical / Trunk Assessment: Normal  Communication   Communication: Expressive difficulties(slow to answer questions requiring questions to be frequently repeated)  Cognition Arousal/Alertness: Awake/alert Behavior During Therapy: WFL for tasks assessed/performed Overall Cognitive Status: Within Functional Limits for tasks assessed                                        General Comments      Exercises     Assessment/Plan    PT Assessment Patient needs continued PT services  PT Problem List Decreased strength;Decreased activity tolerance;Decreased balance;Decreased mobility       PT Treatment Interventions Gait training;Functional mobility training;Therapeutic activities;Therapeutic exercise;Stair training;Patient/family education    PT Goals (Current goals can be found in the Care Plan section)  Acute Rehab PT Goals Patient Stated Goal: return home after rehab PT Goal Formulation: With patient Time For Goal Achievement: 11/13/17 Potential to Achieve Goals: Good    Frequency Min 3X/week   Barriers to discharge        Co-evaluation               AM-PAC PT "6 Clicks" Daily Activity  Outcome Measure Difficulty turning over in bed (including adjusting bedclothes, sheets and blankets)?: A Little Difficulty moving from lying on back to sitting on the side of the bed? : A Lot Difficulty sitting down on and standing up from a chair with arms (e.g., wheelchair, bedside commode, etc,.)?: A Lot Help needed moving to and from a bed to chair (including a wheelchair)?:  A Lot Help needed walking in hospital room?: A Lot Help needed climbing 3-5 steps with a railing? : Total 6 Click Score: 12    End of Session   Activity Tolerance: Patient limited by fatigue Patient left: in chair;with call bell/phone within reach Nurse Communication: Mobility status PT Visit Diagnosis: Unsteadiness on feet (R26.81);Other abnormalities of gait and mobility (R26.89);Muscle weakness (generalized) (M62.81)    Time: 5701-7793 PT Time Calculation (min) (ACUTE ONLY): 34 min   Charges:   PT Evaluation $PT Eval Moderate Complexity: 1 Mod PT Treatments $Therapeutic Activity: 23-37 mins   PT G Codes:        10:40 AM, 2017-11-29 Lonell Grandchild, MPT Physical Therapist with [facility] 336 [phone] office [phone] mobile phone

## 2017-11-06 NOTE — Progress Notes (Signed)
PHARMACIST - PHYSICIAN COMMUNICATION  DR:   Jerilee Hoh  CONCERNING: IV to Oral Route Change Policy  RECOMMENDATION: This patient is receiving folic acid and protonix by the intravenous route.  Based on criteria approved by the Pharmacy and Therapeutics Committee, the intravenous medication(s) is/are being converted to the equivalent oral dose form(s).   DESCRIPTION: These criteria include:  The patient is eating (either orally or via tube) and/or has been taking other orally administered medications for a least 24 hours  The patient has no evidence of active gastrointestinal bleeding or impaired GI absorption (gastrectomy, short bowel, patient on TNA or NPO).  If you have questions about this conversion, please contact the Pharmacy Department  [x]   [phone] )  [place], [place], [facility] 11/06/2017 11:53 AM

## 2017-11-06 NOTE — Care Management Important Message (Signed)
Important Message  Patient Details  Name: [name] MRN: [id number] Date of Birth: [birth year]   Medicare Important Message Given:  Yes    Sherald Barge, RN 11/06/2017, 10:24 AM

## 2017-11-06 NOTE — Plan of Care (Signed)
  Acute Rehab PT Goals(only PT should resolve) Pt Will Go Supine/Side To Sit 11/06/2017 1042 - Progressing by Lonell Grandchild, PT Flowsheets Taken 11/06/2017 1042  Pt will go Supine/Side to Sit with minimal assist Patient Will Transfer Sit To/From Stand 11/06/2017 1042 - Progressing by Lonell Grandchild, PT Flowsheets Taken 11/06/2017 1042  Patient will transfer sit to/from stand with minimal assist Pt Will Transfer Bed To Chair/Chair To Bed 11/06/2017 1042 - Progressing by Lonell Grandchild, PT Flowsheets Taken 11/06/2017 1042  Pt will Transfer Bed to Chair/Chair to Bed with min assist Pt Will Ambulate 11/06/2017 1042 - Progressing by Lonell Grandchild, PT Flowsheets Taken 11/06/2017 1042  Pt will Ambulate 25 feet;with minimal assist;with rolling walker  10:42 AM, 11/06/17 Lonell Grandchild, MPT Physical Therapist with [facility] [phone] office [phone] mobile phone

## 2017-11-06 NOTE — Progress Notes (Signed)
Dressings changed to right posterior calf and skin not intact with some bleeding.  Nonadherent dressing and covaderm applied.  Dressing changed to left lateral calf and wound was nearly healed.  Foam dressing applied

## 2017-11-06 NOTE — Progress Notes (Signed)
ANTICOAGULATION CONSULT NOTE - follow up  Pharmacy Consult for Warfarin (home med) Indication: atrial fibrillation  Allergies  Allergen Reactions  . Ace Inhibitors Cough   Patient Measurements: Height: 5\' 4"  (162.6 cm) Weight: 189 lb 6 oz (85.9 kg) IBW/kg (Calculated) : 54.7  Vital Signs: Temp: 98.4 F (36.9 C) (02/08 0308) Temp Source: Oral (02/08 0308) BP: 176/64 (02/08 0308) Pulse Rate: 80 (02/08 0308)  Labs: Recent Labs    11/03/17 1432 11/04/17 0442 11/05/17 0540 11/05/17 0658 11/06/17 0547  HGB  --  10.1*  --   --   --   HCT  --  32.1*  --   --   --   PLT  --  272  --   --   --   LABPROT  --  44.1* 80.2*  --  38.4*  INR  --  4.74* >10.00*  --  3.96  CREATININE  --  4.40*  --  4.53* 4.56*  TROPONINI 0.08*  --   --   --   --    Estimated Creatinine Clearance: 12 mL/min (A) (by C-G formula based on SCr of 4.56 mg/dL (H)).  Medical History: Past Medical History:  Diagnosis Date  . Anemia   . Arthritis   . Asthma   . Atrial fibrillation ([place])   . Atrial flutter [facility])    TEE/DCCV December 2013 - on Xarelto [facility])  . Bone spur of toe    Right 5th toe  . Chronic diastolic CHF (congestive heart failure) (HCC)    a. EF 50-55% by echo in 2015  . CKD (chronic kidney disease) stage 4, GFR 15-29 ml/min (H[facility])    a. s/p fistula placement but not on HD. Followed by Nephrology.   Marland Kitchen COPD (chronic obstructive pulmonary disease) ([place])   . DVT of upper extremity (deep vein thrombosis) ([place])    Right basilic vein, June 6734  . Essential hypertension, benign   . Fractures   . SLE (systemic lupus erythematosus) ([place])   . Type 2 diabetes mellitus ([place])   . UTI (lower urinary tract infection)    Medications:  Medications Prior to Admission  Medication Sig Dispense Refill Last Dose  . acetaminophen (TYLENOL) 500 MG tablet Take 1,000 mg by mouth every 6 (six) hours as needed. For pain   Taking  . albuterol (ACCUNEB) 0.63 MG/3ML nebulizer solution Take 1 ampule by  nebulization every 6 (six) hours as needed for wheezing or shortness of breath.    11/01/2017 at Unknown time  . albuterol (PROVENTIL HFA;VENTOLIN HFA) 108 (90 BASE) MCG/ACT inhaler Inhale 2 puffs into the lungs every 6 (six) hours as needed. For shortness of breath   11/01/2017 at Unknown time  . amLODipine (NORVASC) 10 MG tablet Take 1 tablet (10 mg total) by mouth at bedtime. 30 tablet 0 11/02/2017 at Unknown time  . atorvastatin (LIPITOR) 40 MG tablet Take 40 mg at bedtime by mouth.    11/01/2017 at Unknown time  . brimonidine-timolol (COMBIGAN) 0.2-0.5 % ophthalmic solution Place 1 drop into the left eye daily.    11/01/2017 at Unknown time  . calcitRIOL (ROCALTROL) 0.25 MCG capsule Take 0.25 mcg by mouth as directed. Take on Mondays Wednesdays and Fridays   11/02/2017 at Unknown time  . diclofenac (VOLTAREN) 0.1 % ophthalmic solution Place 1 drop into the left eye daily. Uses at lunch time   11/01/2017 at Unknown time  . dicyclomine (BENTYL) 10 MG capsule Take 10 mg by mouth 4 (four) times daily -  before meals and at bedtime.   11/02/2017 at Unknown time  . febuxostat (ULORIC) 40 MG tablet Take 40 mg by mouth daily.   11/02/2017 at Unknown time  . folic acid (FOLVITE) 1 MG tablet Take 1 mg by mouth daily.   11/02/2017 at Unknown time  . furosemide (LASIX) 80 MG tablet Take 160 mg by mouth 2 (two) times daily.    11/02/2017 at Unknown time  . HUMALOG KWIKPEN 100 UNIT/ML KiwkPen INJECT UP TO 11 UNITS SUBCUTANEOUSLY BEFORE MEALS THREE TIMES DAILY. 15 mL 2 11/02/2017 at Unknown time  . HYDROcodone-acetaminophen (NORCO/VICODIN) 5-325 MG tablet 1 or 2 tabs PO q12 hours prn pain 10 tablet 0 11/01/2017 at Unknown time  . hydroxychloroquine (PLAQUENIL) 200 MG tablet Take 200 mg by mouth as directed. Take 1 1/2 tablets daily   11/02/2017 at 900  . Insulin Detemir (LEVEMIR FLEXTOUCH) 100 UNIT/ML Pen Inject 10 Units into the skin daily at 10 pm. 5 pen 2 11/01/2017 at Unknown time  . metoprolol tartrate (LOPRESSOR) 50 MG tablet TAKE ONE  TABLET BY MOUTH TWICE DAILY. 60 tablet 6 11/02/2017 at 900  . Multiple Vitamins-Minerals (CENTRUM SILVER PO) Take 1 tablet by mouth daily.   11/02/2017 at Unknown time  . ONE TOUCH ULTRA TEST test strip TEST 4 TIMES A DAY AS DIRECTED. 150 each 5 11/02/2017 at Unknown time  . ONETOUCH DELICA LANCETS 33A MISC USE TO TEST 4 TIMES DAILY. 100 each 5 11/02/2017 at Unknown time  . predniSONE (DELTASONE) 5 MG tablet Take 5 mg by mouth daily with breakfast.   11/02/2017 at Unknown time  . travoprost, benzalkonium, (TRAVATAN) 0.004 % ophthalmic solution Place 1 drop into the left eye at bedtime.   11/01/2017 at Unknown time  . UNIFINE PENTIPS 31G X 6 MM MISC USE AS DIRECTED AT BEDTIME WITH LEVEMIR AND WITH SLIDING SCALE INSULIN. 150 each 5 11/02/2017 at Unknown time  . Vitamin D, Ergocalciferol, (DRISDOL) 50000 units CAPS capsule TAKE 1 CAPSULE BY MOUTH ONCE WEEKLY. 12 capsule 0 Past Week at Unknown time  . warfarin (COUMADIN) 5 MG tablet Take 1 tablet daily (Patient taking differently: Take 5 mg by mouth daily at 6 PM. Take 1 tablet daily) 180 tablet 3 11/01/2017 at 1800    Home dose = 5mg  daily per PTA med list.  Assessment: 72 yo woman admitted due to renal failure, acute on chronic. INR was supratherapeutic > 10 with no e/o bleeding. Vitamin K 5mg  po given x 1 on 2/7. INR this AM is trending down to 3.96.   Goal of Therapy:  INR 2-3   Plan:  No warfarin today Daily PT/INR Monitor for s/s of bleeding  Isac Sarna, BS [place], [place] Clinical Pharmacist Pager [phone] 11/06/2017 11:37 AM

## 2017-11-06 NOTE — Clinical Social Work Note (Signed)
LCSW started dialysis with [facility] via [id number]. Tentative start date of for out patient dialysis is 11/11/17 (if patient does not discharge facility will be notified).      Filipe Greathouse, Clydene Pugh, LCSW

## 2017-11-07 LAB — GLUCOSE, CAPILLARY
GLUCOSE-CAPILLARY: 185 mg/dL — AB (ref 65–99)
GLUCOSE-CAPILLARY: 203 mg/dL — AB (ref 65–99)
GLUCOSE-CAPILLARY: 243 mg/dL — AB (ref 65–99)
GLUCOSE-CAPILLARY: 371 mg/dL — AB (ref 65–99)
Glucose-Capillary: 141 mg/dL — ABNORMAL HIGH (ref 65–99)
Glucose-Capillary: 413 mg/dL — ABNORMAL HIGH (ref 65–99)

## 2017-11-07 LAB — RENAL FUNCTION PANEL
Albumin: 2.2 g/dL — ABNORMAL LOW (ref 3.5–5.0)
Anion gap: 16 — ABNORMAL HIGH (ref 5–15)
BUN: 132 mg/dL — ABNORMAL HIGH (ref 6–20)
CALCIUM: 8.9 mg/dL (ref 8.9–10.3)
CHLORIDE: 101 mmol/L (ref 101–111)
CO2: 21 mmol/L — ABNORMAL LOW (ref 22–32)
CREATININE: 4.44 mg/dL — AB (ref 0.44–1.00)
GFR, EST AFRICAN AMERICAN: 11 mL/min — AB (ref >60)
GFR, EST NON AFRICAN AMERICAN: 9 mL/min — AB (ref >60)
Glucose, Bld: 163 mg/dL — ABNORMAL HIGH (ref 65–99)
PHOSPHORUS: 6.2 mg/dL — AB (ref 2.5–4.6)
Potassium: 4.2 mmol/L (ref 3.5–5.1)
Sodium: 138 mmol/L (ref 135–145)

## 2017-11-07 LAB — CULTURE, BLOOD (ROUTINE X 2)
CULTURE: NO GROWTH
Culture: NO GROWTH
Special Requests: ADEQUATE
Special Requests: ADEQUATE

## 2017-11-07 LAB — PROTIME-INR
INR: 1.68
Prothrombin Time: 19.7 seconds — ABNORMAL HIGH (ref 11.4–15.2)

## 2017-11-07 MED ORDER — INSULIN ASPART 100 UNIT/ML IV SOLN: 11.0000 [IU] | Freq: Once | INTRAVENOUS | Status: DC

## 2017-11-07 MED ORDER — CEFAZOLIN SODIUM-DEXTROSE 1-4 GM/50ML-% IV SOLN
1.0000 g | Freq: Once | INTRAVENOUS | Status: AC
  Filled 2017-11-07: qty 50

## 2017-11-07 MED ORDER — ORAL CARE MOUTH RINSE
15.0000 mL | Freq: Two times a day (BID) | OROMUCOSAL | Status: DC
  Administered 2017-11-07 – 2017-11-13 (×10): 15 mL via OROMUCOSAL

## 2017-11-07 MED ORDER — WARFARIN SODIUM 2.5 MG PO TABS
2.5000 mg | ORAL_TABLET | Freq: Once | ORAL | Status: DC
  Filled 2017-11-07: qty 1

## 2017-11-07 MED ORDER — INSULIN ASPART 100 UNIT/ML ~~LOC~~ SOLN
11.0000 [IU] | Freq: Once | SUBCUTANEOUS | Status: AC
  Administered 2017-11-07: 11 [IU] via SUBCUTANEOUS

## 2017-11-07 NOTE — Progress Notes (Signed)
PROGRESS NOTE    [name]  ZDG:[id number] DOB: November 02, 1945 DOA: 11/02/2017 PCP: Sharilyn Sites, MD     Brief Narrative:  72 year old woman admitted from home on 2/4 due to worsening of chronic abdominal pain with nausea and vomiting.  In addition she had been complaining of a nonproductive cough and low-grade fevers.  She was found to be hypoxemic with worsening renal failure.  On 2/7 INR was found to be greater than 10.  Was scheduled to start dialysis on 2/8, however hemodialysis nurse was unable to access left upper arm AV fistula.  Nephrology has made plans to send patient to [facility] on Monday for placement of a tunneled dialysis catheter to initiate dialysis.   Assessment & Plan:   Principal Problem:   Renal failure (ARF), acute on chronic (HCC) Active Problems:   Atrial flutter (HCC)   SLE (systemic lupus erythematosus) (HCC)   Essential hypertension, benign   COPD (chronic obstructive pulmonary disease) (HCC)   Anemia of chronic renal failure, stage 4 (severe) (HCC)   Acute respiratory failure with hypoxia (HCC)   Chronic diastolic heart failure (HCC)   Focal segmental glomerulosclerosis without nephrosis or chronic glomerulonephritis   Mixed hyperlipidemia   Type 2 diabetes mellitus with stage 4 chronic kidney disease (HCC)   Iron deficiency anemia   Community acquired pneumonia   Nausea and vomiting   AKI (acute kidney injury) ([place])   Acute renal failure superimposed on stage 4 chronic kidney disease (HCC)   COPD with acute exacerbation (HCC)   Acute hypoxemic respiratory failure -Due to COPD exacerbation and presumed aspiration pneumonia/lobar pneumonia. -Her respiratory virus panel has also resulted positive for RSV. -Continue Zosyn (planned for 7 days total of treatment with antibiotics) Day 5/7, given concern for aspiration given her nausea and vomiting prior to admission, okay to DC azithromycin especially given worsening INR. -She has been weaned off  BiPAP. -Clinically improving although she continues to have an oxygen requirement which is new.  Should improve now with hemodialysis  COPD with acute exacerbation -Continue nebs. -Continue steroids; although continue to titrate dose, down to Solu-Medrol 60 mg daily today. -She appears to be clinically improving.  Acute on chronic kidney disease stage IV -Progressing to end-stage renal disease this admission .-Appreciate nephrology input and recommendations. -Nephrology plans to start dialysis this admission.  However HD nurse was unable to access left upper arm fistula.  Dr. Lowanda Foster has plan for patient to go to [facility] on Monday to have a tunneled dialysis catheter placement by radiology. -Has been started on oral bicarb tablets given metabolic acidosis due to advanced renal failure. -Renal biopsy in 2015 with collapsing FSGS plus severe arterial sclerosis with moderate to severe tubulointerstitial scarring and superimposed acute tubular injury.  UTI -UA with significant pyuria, unfortunately urine culture was not obtained on admission. -Zosyn should be sufficient coverage.  Abdominal pain/intractable nausea and vomiting -Has resolved, suspect viral gastroenteritis.  Chronic diastolic heart failure -Clinically appears euvolemic and compensated. -Echo from 2/5 has been reviewed: EF 55-60% with no wall motion abnormalities and grade 2 diastolic dysfunction,, PA pressure of 63.  Paroxysmal atrial fibrillation -Currently in sinus rhythm, anticoagulated on warfarin. -Rate controlled on metoprolol. -INR 1.68 today. -Received vitamin K on 2/7.Marland Kitchen  Systemic lupus -Continue Plaquenil  Essential hypertension -Well-controlled, continue current management  Type 2 diabetes -Recent hemoglobin A1c of 5.6 demonstrates excellent control. -CBGs improving. -Should continue to improve once steroids are titrated. -Improving with initiation of Levemir.   DVT prophylaxis:  Warfarin Code  Status: Full code Family Communication: Husband at bedside updated on plan of care and questions answered Disposition Plan: Husband has questions about patient's disposition after discharge given the layout of their house.  We will also need to arrange outpatient slot she is newly started on hemodialysis.  Consultants:   Nephrology  Procedures:   Echo as above  Antimicrobials:  Anti-infectives (From admission, onward)   Start     Dose/Rate Route Frequency Ordered Stop   11/09/17 0900  ceFAZolin (ANCEF) IVPB 1 g/50 mL premix    Comments:  Give in IR for surgical prophylaxis   1 g 100 mL/hr over 30 Minutes Intravenous  Once 11/07/17 0957     11/03/17 2200  cefTRIAXone (ROCEPHIN) 1 g in dextrose 5 % 50 mL IVPB  Status:  Discontinued     1 g 100 mL/hr over 30 Minutes Intravenous Every 24 hours 11/02/17 2234 11/03/17 0712   11/03/17 2200  piperacillin-tazobactam (ZOSYN) IVPB 3.375 g     3.375 g 12.5 mL/hr over 240 Minutes Intravenous Every 12 hours 11/03/17 1006     11/03/17 1000  hydroxychloroquine (PLAQUENIL) tablet 300 mg    Comments:  Take 1 1/2 tablets daily     300 mg Oral Daily 11/02/17 2214     11/03/17 0900  piperacillin-tazobactam (ZOSYN) 2.25 g in dextrose 5 % 50 mL IVPB  Status:  Discontinued     2.25 g 100 mL/hr over 30 Minutes Intravenous Every 8 hours 11/03/17 0739 11/03/17 1005   11/03/17 0730  piperacillin-tazobactam (ZOSYN) IVPB 2.25 g  Status:  Discontinued     2.25 g 100 mL/hr over 30 Minutes Intravenous Every 6 hours 11/03/17 0712 11/03/17 0739   11/02/17 2300  azithromycin (ZITHROMAX) 500 mg in dextrose 5 % 250 mL IVPB  Status:  Discontinued     500 mg 250 mL/hr over 60 Minutes Intravenous Every 24 hours 11/02/17 2214 11/05/17 0940   11/02/17 2015  azithromycin (ZITHROMAX) 500 mg in dextrose 5 % 250 mL IVPB  Status:  Discontinued     500 mg 250 mL/hr over 60 Minutes Intravenous  Once 11/02/17 2004 11/02/17 2214   11/02/17 2015  cefTRIAXone (ROCEPHIN) 2 g in  dextrose 5 % 50 mL IVPB     2 g 100 mL/hr over 30 Minutes Intravenous  Once 11/02/17 2004 11/02/17 2348       Subjective: In bed, comfortable, tearful about the prospect of starting on dialysis.  Objective: Vitals:   11/07/17 0749 11/07/17 1215 11/07/17 1437 11/07/17 1533  BP:  (!) 178/70  (!) 195/80  Pulse:  81  93  Resp:    18  Temp:    98 F (36.7 C)  TempSrc:    Oral  SpO2: 94%  95% 99%  Weight:      Height:        Intake/Output Summary (Last 24 hours) at 11/07/2017 1623 Last data filed at 11/07/2017 6789 Gross per 24 hour  Intake 405 ml  Output 800 ml  Net -395 ml   Filed Weights   11/05/17 1700 11/06/17 0308 11/07/17 0533  Weight: 86.4 kg (190 lb 7.6 oz) 85.9 kg (189 lb 6 oz) 87.8 kg (193 lb 9 oz)    Examination:  General exam: Alert, awake, oriented x 3 Respiratory system: Clear to auscultation. Respiratory effort normal. Cardiovascular system:RRR. No murmurs, rubs, gallops. Gastrointestinal system: Abdomen is nondistended, soft and nontender. No organomegaly or masses felt. Normal bowel sounds heard. Central nervous system: Alert  and oriented. No focal neurological deficits. Extremities: No C/C/E, +pedal pulses Skin: No rashes, lesions or ulcers Psychiatry: Judgement and insight appear normal. Mood & affect appropriate.       Data Reviewed: I have personally reviewed following labs and imaging studies  CBC: Recent Labs  Lab 11/02/17 1536 11/03/17 0208 11/03/17 0801 11/04/17 0442 11/06/17 1654  WBC 17.7* 18.2*  --  10.8* 16.5*  NEUTROABS 16.3*  --   --   --   --   HGB 10.1* 8.8* 8.5* 10.1* 10.1*  HCT 32.2* 27.9* 27.6* 32.1* 32.4*  MCV 87.5 86.9  --  87.7 86.2  PLT 262 279  --  272 379   Basic Metabolic Panel: Recent Labs  Lab 11/03/17 0208 11/04/17 0442 11/05/17 0658 11/06/17 0547 11/07/17 0647  NA 134* 134* 133* 134* 138  K 4.2 4.4 4.2 4.1 4.2  CL 103 101 98* 99* 101  CO2 18* 18* 20* 20* 21*  GLUCOSE 235* 235* 146* 155* 163*  BUN  89* 100* 116* 128* 132*  CREATININE 3.97* 4.40* 4.53* 4.56* 4.44*  CALCIUM 8.6* 8.8* 8.8* 8.6* 8.9  MG  --   --   --  2.4  --   PHOS  --  7.5* 7.6* 7.0* 6.2*   GFR: Estimated Creatinine Clearance: 12.5 mL/min (A) (by C-G formula based on SCr of 4.44 mg/dL (H)). Liver Function Tests: Recent Labs  Lab 11/02/17 1536 11/04/17 0442 11/05/17 0658 11/06/17 0547 11/07/17 0647  AST 37  --   --   --   --   ALT 27  --   --   --   --   ALKPHOS 108  --   --   --   --   BILITOT 0.6  --   --   --   --   PROT 7.4  --   --   --   --   ALBUMIN 2.3* 2.1* 2.1* 2.0* 2.2*   Recent Labs  Lab 11/02/17 1536  LIPASE 20   No results for input(s): AMMONIA in the last 168 hours. Coagulation Profile: Recent Labs  Lab 11/03/17 0208 11/04/17 0442 11/05/17 0540 11/06/17 0547 11/07/17 0647  INR 2.56 4.74* >10.00* 3.96 1.68   Cardiac Enzymes: Recent Labs  Lab 11/02/17 1536 11/02/17 2011 11/03/17 0208 11/03/17 0801 11/03/17 1432  TROPONINI 0.09* 0.08* 0.09* 0.09* 0.08*   BNP (last 3 results) No results for input(s): PROBNP in the last 8760 hours. HbA1C: No results for input(s): HGBA1C in the last 72 hours. CBG: Recent Labs  Lab 11/06/17 2000 11/07/17 0011 11/07/17 0424 11/07/17 0719 11/07/17 1134  GLUCAP 401* 243* 203* 141* 185*   Lipid Profile: No results for input(s): CHOL, HDL, LDLCALC, TRIG, CHOLHDL, LDLDIRECT in the last 72 hours. Thyroid Function Tests: No results for input(s): TSH, T4TOTAL, FREET4, T3FREE, THYROIDAB in the last 72 hours. Anemia Panel: No results for input(s): VITAMINB12, FOLATE, FERRITIN, TIBC, IRON, RETICCTPCT in the last 72 hours. Urine analysis:    Component Value Date/Time   COLORURINE YELLOW 11/02/2017 1527   APPEARANCEUR CLOUDY (A) 11/02/2017 1527   LABSPEC 1.010 11/02/2017 1527   PHURINE 8.0 11/02/2017 1527   GLUCOSEU NEGATIVE 11/02/2017 1527   HGBUR NEGATIVE 11/02/2017 1527   BILIRUBINUR NEGATIVE 11/02/2017 1527   KETONESUR NEGATIVE  11/02/2017 1527   PROTEINUR 100 (A) 11/02/2017 1527   UROBILINOGEN 1.0 03/17/2014 1830   NITRITE NEGATIVE 11/02/2017 1527   LEUKOCYTESUR LARGE (A) 11/02/2017 1527   Sepsis Labs: @LABRCNTIP (procalcitonin:4,lacticidven:4)  ) Recent Results (  from the past 240 hour(s))  Culture, blood (Routine X 2) w Reflex to ID Panel     Status: None   Collection Time: 11/02/17  8:11 PM  Result Value Ref Range Status   Specimen Description BLOOD RIGHT FOREARM  Final   Special Requests   Final    BOTTLES DRAWN AEROBIC ONLY Blood Culture adequate volume   Culture   Final    NO GROWTH 5 DAYS Performed at [facility], [address] [place], [place] [address]    Report Status 11/07/2017 FINAL  Final  Culture, blood (Routine X 2) w Reflex to ID Panel     Status: None   Collection Time: 11/02/17  8:11 PM  Result Value Ref Range Status   Specimen Description BLOOD RIGHT WRIST  Final   Special Requests   Final    BOTTLES DRAWN AEROBIC ONLY Blood Culture adequate volume   Culture   Final    NO GROWTH 5 DAYS Performed at [facility], [address], [place], [place] [address]    Report Status 11/07/2017 FINAL  Final  Respiratory Panel by PCR     Status: Abnormal   Collection Time: 11/02/17 10:13 PM  Result Value Ref Range Status   Adenovirus NOT DETECTED NOT DETECTED Final   Coronavirus 229E NOT DETECTED NOT DETECTED Final   Coronavirus HKU1 NOT DETECTED NOT DETECTED Final   Coronavirus NL63 NOT DETECTED NOT DETECTED Final   Coronavirus OC43 NOT DETECTED NOT DETECTED Final   Metapneumovirus NOT DETECTED NOT DETECTED Final   Rhinovirus / Enterovirus NOT DETECTED NOT DETECTED Final   Influenza A NOT DETECTED NOT DETECTED Final   Influenza B NOT DETECTED NOT DETECTED Final   Parainfluenza Virus 1 NOT DETECTED NOT DETECTED Final   Parainfluenza Virus 2 NOT DETECTED NOT DETECTED Final   Parainfluenza Virus 3 NOT DETECTED NOT DETECTED Final   Parainfluenza Virus 4 NOT DETECTED NOT DETECTED Final    Respiratory Syncytial Virus DETECTED (A) NOT DETECTED Final    Comment: CRITICAL RESULT CALLED TO, READ BACK BY AND VERIFIED WITH: RN A HOBBS [id number]    Bordetella pertussis NOT DETECTED NOT DETECTED Final   Chlamydophila pneumoniae NOT DETECTED NOT DETECTED Final   Mycoplasma pneumoniae NOT DETECTED NOT DETECTED Final    Comment: Performed at [facility], [place] [address] [place], [place] [address]  MRSA PCR Screening     Status: None   Collection Time: 11/03/17 10:00 AM  Result Value Ref Range Status   MRSA by PCR NEGATIVE NEGATIVE Final    Comment:        The GeneXpert MRSA Assay (FDA approved for NASAL specimens only), is one component of a comprehensive MRSA colonization surveillance program. It is not intended to diagnose MRSA infection nor to guide or monitor treatment for MRSA infections. Performed at [facility], [address], [place], [place] [address]          Radiology Studies: No results found.      Scheduled Meds: . atorvastatin  40 mg Oral QHS  . brimonidine  1 drop Left Eye Daily   And  . timolol  1 drop Left Eye Daily  . calcitRIOL  0.25 mcg Oral Once per day on Mon Wed Fri  . calcium acetate  1,334 mg Oral TID WC  . febuxostat  40 mg Oral Daily  . feeding supplement  1 Container Oral TID BM  . folic acid  1 mg Oral Daily  . hydroxychloroquine  300 mg Oral Daily  . insulin aspart  0-9 Units Subcutaneous Q4H  . insulin detemir  10 Units Subcutaneous QHS  . ipratropium-albuterol  3 mL Nebulization TID  . ketorolac  1 drop Left Eye Daily  . latanoprost  1 drop Left Eye QHS  . mouth rinse  15 mL Mouth Rinse BID  . methylPREDNISolone (SOLU-MEDROL) injection  60 mg Intravenous Daily  . metoprolol tartrate  2.5 mg Intravenous Q6H  . pantoprazole  40 mg Oral Daily  . sodium bicarbonate  650 mg Oral TID  . tuberculin  5 Units Intradermal Once  . warfarin  2.5 mg Oral Once  . Warfarin - Pharmacist Dosing Inpatient   Does not apply  Q24H   Continuous Infusions: . sodium chloride    . sodium chloride    . [START ON 11/09/2017]  ceFAZolin (ANCEF) IV    . furosemide Stopped (11/07/17 0936)  . piperacillin-tazobactam (ZOSYN)  IV Stopped (11/07/17 1244)     LOS: 5 days    Time spent: 25 minutes.    Lelon Frohlich, MD [facility] Pager [phone]  If 7PM-7AM, please contact night-coverage [email or web address] Password [facility] 11/07/2017, 4:23 PM

## 2017-11-07 NOTE — Progress Notes (Signed)
Patient requesting all four side rails up in bed for mobility assistance.

## 2017-11-07 NOTE — Progress Notes (Signed)
Talked with Dr. Aletta Edouard at 1700 to clarify order for holding coumadin and asked for latest INR value and he said to hold coumadin starting today until after procedure on Monday.

## 2017-11-07 NOTE — Progress Notes (Addendum)
  Request seen for placement of tunneled hemodialysis catheter.  Will plan for Monday am.  Patient will need transport from [facility] to [facility] for procedure.  IR PA will call nurse Monday am and inform him/her of time of procedure so transportation can be arranged.  NPO after MN order in place.   Lab orders in place (CBC and INR)  Order to hold anticoagulation in place.  If patient needs to be bridged with Sub-Q heparin, please hold Sunday pm dose and Monday am dose (resume Monday night if no bleeding).  Moriah Loughry S Orella Cushman PA-C 11/07/2017 9:49 AM

## 2017-11-07 NOTE — Progress Notes (Signed)
Telemetry said patient has been sinus rhythm all day in the 70's and 80's.  Tele box removed since order to dc was for 11/03/17

## 2017-11-07 NOTE — Progress Notes (Signed)
ANTICOAGULATION CONSULT NOTE - follow up  Pharmacy Consult for Warfarin (home med) Indication: atrial fibrillation  Allergies  Allergen Reactions  . Ace Inhibitors Cough   Patient Measurements: Height: 5\' 4"  (162.6 cm) Weight: 193 lb 9 oz (87.8 kg) IBW/kg (Calculated) : 54.7  Vital Signs: Temp: 97.5 F (36.4 C) (02/09 0533) Temp Source: Oral (02/09 0533) BP: 188/83 (02/09 0533) Pulse Rate: 81 (02/09 0533)  Labs: Recent Labs    11/05/17 0540 11/05/17 3664 11/06/17 0547 11/06/17 1654 11/07/17 0647  HGB  --   --   --  10.1*  --   HCT  --   --   --  32.4*  --   PLT  --   --   --  368  --   LABPROT 80.2*  --  38.4*  --  19.7*  INR >10.00*  --  3.96  --  1.68  CREATININE  --  4.53* 4.56*  --  4.44*   Estimated Creatinine Clearance: 12.5 mL/min (A) (by C-G formula based on SCr of 4.44 mg/dL (H)).  Medical History: Past Medical History:  Diagnosis Date  . Anemia   . Arthritis   . Asthma   . Atrial fibrillation ([place])   . Atrial flutter [facility])    TEE/DCCV December 2013 - on Xarelto [facility])  . Bone spur of toe    Right 5th toe  . Chronic diastolic CHF (congestive heart failure) (HCC)    a. EF 50-55% by echo in 2015  . CKD (chronic kidney disease) stage 4, GFR 15-29 ml/min (HCC)    a. s/p fistula placement but not on HD. Followed by Nephrology.   Marland Kitchen COPD (chronic obstructive pulmonary disease) ([place])   . DVT of upper extremity (deep vein thrombosis) ([place])    Right basilic vein, June 4034  . Essential hypertension, benign   . Fractures   . SLE (systemic lupus erythematosus) ([place])   . Type 2 diabetes mellitus ([place])   . UTI (lower urinary tract infection)    Medications:  Medications Prior to Admission  Medication Sig Dispense Refill Last Dose  . acetaminophen (TYLENOL) 500 MG tablet Take 1,000 mg by mouth every 6 (six) hours as needed. For pain   Taking  . albuterol (ACCUNEB) 0.63 MG/3ML nebulizer solution Take 1 ampule by nebulization every 6 (six) hours as needed for  wheezing or shortness of breath.    11/01/2017 at Unknown time  . albuterol (PROVENTIL HFA;VENTOLIN HFA) 108 (90 BASE) MCG/ACT inhaler Inhale 2 puffs into the lungs every 6 (six) hours as needed. For shortness of breath   11/01/2017 at Unknown time  . amLODipine (NORVASC) 10 MG tablet Take 1 tablet (10 mg total) by mouth at bedtime. 30 tablet 0 11/02/2017 at Unknown time  . atorvastatin (LIPITOR) 40 MG tablet Take 40 mg at bedtime by mouth.    11/01/2017 at Unknown time  . brimonidine-timolol (COMBIGAN) 0.2-0.5 % ophthalmic solution Place 1 drop into the left eye daily.    11/01/2017 at Unknown time  . calcitRIOL (ROCALTROL) 0.25 MCG capsule Take 0.25 mcg by mouth as directed. Take on Mondays Wednesdays and Fridays   11/02/2017 at Unknown time  . diclofenac (VOLTAREN) 0.1 % ophthalmic solution Place 1 drop into the left eye daily. Uses at lunch time   11/01/2017 at Unknown time  . dicyclomine (BENTYL) 10 MG capsule Take 10 mg by mouth 4 (four) times daily -  before meals and at bedtime.   11/02/2017 at Unknown time  . febuxostat (  ULORIC) 40 MG tablet Take 40 mg by mouth daily.   11/02/2017 at Unknown time  . folic acid (FOLVITE) 1 MG tablet Take 1 mg by mouth daily.   11/02/2017 at Unknown time  . furosemide (LASIX) 80 MG tablet Take 160 mg by mouth 2 (two) times daily.    11/02/2017 at Unknown time  . HUMALOG KWIKPEN 100 UNIT/ML KiwkPen INJECT UP TO 11 UNITS SUBCUTANEOUSLY BEFORE MEALS THREE TIMES DAILY. 15 mL 2 11/02/2017 at Unknown time  . HYDROcodone-acetaminophen (NORCO/VICODIN) 5-325 MG tablet 1 or 2 tabs PO q12 hours prn pain 10 tablet 0 11/01/2017 at Unknown time  . hydroxychloroquine (PLAQUENIL) 200 MG tablet Take 200 mg by mouth as directed. Take 1 1/2 tablets daily   11/02/2017 at 900  . Insulin Detemir (LEVEMIR FLEXTOUCH) 100 UNIT/ML Pen Inject 10 Units into the skin daily at 10 pm. 5 pen 2 11/01/2017 at Unknown time  . metoprolol tartrate (LOPRESSOR) 50 MG tablet TAKE ONE TABLET BY MOUTH TWICE DAILY. 60 tablet 6  11/02/2017 at 900  . Multiple Vitamins-Minerals (CENTRUM SILVER PO) Take 1 tablet by mouth daily.   11/02/2017 at Unknown time  . ONE TOUCH ULTRA TEST test strip TEST 4 TIMES A DAY AS DIRECTED. 150 each 5 11/02/2017 at Unknown time  . ONETOUCH DELICA LANCETS 78H MISC USE TO TEST 4 TIMES DAILY. 100 each 5 11/02/2017 at Unknown time  . predniSONE (DELTASONE) 5 MG tablet Take 5 mg by mouth daily with breakfast.   11/02/2017 at Unknown time  . travoprost, benzalkonium, (TRAVATAN) 0.004 % ophthalmic solution Place 1 drop into the left eye at bedtime.   11/01/2017 at Unknown time  . UNIFINE PENTIPS 31G X 6 MM MISC USE AS DIRECTED AT BEDTIME WITH LEVEMIR AND WITH SLIDING SCALE INSULIN. 150 each 5 11/02/2017 at Unknown time  . Vitamin D, Ergocalciferol, (DRISDOL) 50000 units CAPS capsule TAKE 1 CAPSULE BY MOUTH ONCE WEEKLY. 12 capsule 0 Past Week at Unknown time  . warfarin (COUMADIN) 5 MG tablet Take 1 tablet daily (Patient taking differently: Take 5 mg by mouth daily at 6 PM. Take 1 tablet daily) 180 tablet 3 11/01/2017 at 1800    Home dose = 5mg  daily per PTA med list.  Assessment: 72 yo woman admitted due to renal failure, acute on chronic. INR was supratherapeutic > 10 with no e/o bleeding. Vitamin K 5mg  po given x 1 on 2/7. INR this AM is trending down to 3.96--> 1.68. Will restart coumadin.   Goal of Therapy:  INR 2-3   Plan:  Coumadin 2.5mg  x 1  today Daily PT/INR Monitor for s/s of bleeding  Isac Sarna, BS [place]ena [place], BCPS Clinical Pharmacist Pager [phone] 11/07/2017 10:47 AM

## 2017-11-07 NOTE — Progress Notes (Signed)
Subjective: Interval History: Patient still complains of cough but no sputum production.  She denies also any difficulty breathing.  Her appetite is good and she does not have any nausea or vomiting.  Objective: Vital signs in last 24 hours: Temp:  [97.5 F (36.4 C)-99.5 F (37.5 C)] 97.5 F (36.4 C) (02/09 0533) Pulse Rate:  [79-87] 81 (02/09 0533) BP: (152-195)/(65-86) 188/83 (02/09 0533) SpO2:  [91 %-100 %] 94 % (02/09 0749) Weight:  [87.8 kg (193 lb 9 oz)] 87.8 kg (193 lb 9 oz) (02/09 0533) Weight change: 1.4 kg (3 lb 1.4 oz)  Intake/Output from previous day: 02/08 0701 - 02/09 0700 In: 885 [P.O.:720; IV Piggyback:100] Out: 600 [Urine:600] Intake/Output this shift: Total I/O In: -  Out: 200 [Urine:200]  General appearance: alert, cooperative and no distress Resp: diminished breath sounds bilaterally Cardio: regular rate and rhythm Extremities: No edema  Lab Results: Recent Labs    11/06/17 1654  WBC 16.5*  HGB 10.1*  HCT 32.4*  PLT 368   BMET:  Recent Labs    11/06/17 0547 11/07/17 0647  NA 134* 138  K 4.1 4.2  CL 99* 101  CO2 20* 21*  GLUCOSE 155* 163*  BUN 128* 132*  CREATININE 4.56* 4.44*  CALCIUM 8.6* 8.9   No results for input(s): PTH in the last 72 hours. Iron Studies:  No results for input(s): IRON, TIBC, TRANSFERRIN, FERRITIN in the last 72 hours.  Studies/Results: No results found.  I have reviewed the patient's current medications.  Assessment/Plan: 1] chronic renal failure: Her potassium is normal.  Her renal function is stable.  Presently patient is a symptomatic.  We are not able to dialyze patient because her fistula does not seem to develop very well. 2] difficulty breathing: Possibly a combination of COPD and fluid overload.  Patient is nonoliguric.  She complains of cough but no difficulty breathing. 3] hypertension: Her blood pressure is reasonably controlled 4] diabetes: Blood sugar is reasonably controlled 5] history of  lupus 6] bone mineral disorder: Her calcium  is range but her phosphorus is high.  Patient is on a binder. 7] anemia: Her hemoglobin remains within our target goal. Plan: 1] will make arrangement for patient to give tunnel catheter and then initiate dialysis.  I have discussed with the patient. 2] we will check a renal panel in the morning. 3] on discharge patient will be going to [facility] outpatient dialysis unit.   LOS: 5 days   Ariyon Mittleman S 11/07/2017,9:13 AM

## 2017-11-07 NOTE — Progress Notes (Signed)
Sitting in bed eating.  Dr. Birdie Sons talking with patient at this time

## 2017-11-08 LAB — PROTIME-INR
INR: 1.45
PROTHROMBIN TIME: 17.5 s — AB (ref 11.4–15.2)

## 2017-11-08 LAB — CBC
HCT: 35.6 % — ABNORMAL LOW (ref 36.0–46.0)
HEMOGLOBIN: 11.3 g/dL — AB (ref 12.0–15.0)
MCH: 27.2 pg (ref 26.0–34.0)
MCHC: 31.7 g/dL (ref 30.0–36.0)
MCV: 85.6 fL (ref 78.0–100.0)
Platelets: 477 10*3/uL — ABNORMAL HIGH (ref 150–400)
RBC: 4.16 MIL/uL (ref 3.87–5.11)
RDW: 18.3 % — ABNORMAL HIGH (ref 11.5–15.5)
WBC: 18.4 10*3/uL — AB (ref 4.0–10.5)

## 2017-11-08 LAB — GLUCOSE, CAPILLARY
GLUCOSE-CAPILLARY: 172 mg/dL — AB (ref 65–99)
GLUCOSE-CAPILLARY: 418 mg/dL — AB (ref 65–99)
Glucose-Capillary: 134 mg/dL — ABNORMAL HIGH (ref 65–99)
Glucose-Capillary: 138 mg/dL — ABNORMAL HIGH (ref 65–99)
Glucose-Capillary: 285 mg/dL — ABNORMAL HIGH (ref 65–99)

## 2017-11-08 LAB — RENAL FUNCTION PANEL
ALBUMIN: 2.1 g/dL — AB (ref 3.5–5.0)
ANION GAP: 15 (ref 5–15)
BUN: 141 mg/dL — ABNORMAL HIGH (ref 6–20)
CALCIUM: 9 mg/dL (ref 8.9–10.3)
CO2: 23 mmol/L (ref 22–32)
CREATININE: 4.3 mg/dL — AB (ref 0.44–1.00)
Chloride: 101 mmol/L (ref 101–111)
GFR, EST AFRICAN AMERICAN: 11 mL/min — AB (ref >60)
GFR, EST NON AFRICAN AMERICAN: 9 mL/min — AB (ref >60)
Glucose, Bld: 187 mg/dL — ABNORMAL HIGH (ref 65–99)
PHOSPHORUS: 4.9 mg/dL — AB (ref 2.5–4.6)
Potassium: 3.4 mmol/L — ABNORMAL LOW (ref 3.5–5.1)
Sodium: 139 mmol/L (ref 135–145)

## 2017-11-08 MED ORDER — INSULIN ASPART 100 UNIT/ML ~~LOC~~ SOLN
20.0000 [IU] | Freq: Once | SUBCUTANEOUS | Status: AC
  Administered 2017-11-08: 20 [IU] via SUBCUTANEOUS

## 2017-11-08 MED ORDER — WARFARIN SODIUM 5 MG PO TABS: 5.0000 mg | ORAL_TABLET | Freq: Once | ORAL | Status: DC

## 2017-11-08 MED ORDER — INSULIN ASPART 100 UNIT/ML ~~LOC~~ SOLN
0.0000 [IU] | Freq: Three times a day (TID) | SUBCUTANEOUS | Status: DC
  Administered 2017-11-08: 3 [IU] via SUBCUTANEOUS
  Administered 2017-11-08: 4 [IU] via SUBCUTANEOUS
  Administered 2017-11-08 – 2017-11-09 (×2): 3 [IU] via SUBCUTANEOUS
  Administered 2017-11-09: 15 [IU] via SUBCUTANEOUS
  Administered 2017-11-09: 20 [IU] via SUBCUTANEOUS
  Administered 2017-11-10: 7 [IU] via SUBCUTANEOUS
  Administered 2017-11-10 (×2): 4 [IU] via SUBCUTANEOUS
  Administered 2017-11-11 (×2): 11 [IU] via SUBCUTANEOUS
  Administered 2017-11-11 – 2017-11-12 (×2): 4 [IU] via SUBCUTANEOUS
  Administered 2017-11-12: 11 [IU] via SUBCUTANEOUS
  Administered 2017-11-12: 4 [IU] via SUBCUTANEOUS
  Administered 2017-11-13: 3 [IU] via SUBCUTANEOUS
  Administered 2017-11-13 (×2): 4 [IU] via SUBCUTANEOUS

## 2017-11-08 NOTE — Progress Notes (Signed)
BP  210/70  Pulse 80.  Also, she has appt with gastro doctor on Thursday but doesn't know name Texted Dr, Jerilee Hoh with with information.  She has been having abd pain for several months

## 2017-11-08 NOTE — Progress Notes (Addendum)
Subjective: Interval History: Patient offers no complaints except lack of sleep last night.  She denies any difficulty in breathing.  She also denies any nausea or vomiting.  Her appetite is good.  Objective: Vital signs in last 24 hours: Temp:  [97.7 F (36.5 C)-98 F (36.7 C)] 97.7 F (36.5 C) (02/10 0647) Pulse Rate:  [81-93] 81 (02/10 0647) Resp:  [18] 18 (02/10 0647) BP: (135-195)/(48-80) 182/56 (02/10 0647) SpO2:  [94 %-99 %] 94 % (02/10 0825) Weight:  [82 kg (180 lb 12.4 oz)] 82 kg (180 lb 12.4 oz) (02/10 0706) Weight change:   Intake/Output from previous day: 02/09 0701 - 02/10 0700 In: 424 [P.O.:120; IV Piggyback:274] Out: 1800 [Urine:1800] Intake/Output this shift: No intake/output data recorded.  General appearance: alert, cooperative and no distress Resp: diminished breath sounds bilaterally Cardio: regular rate and rhythm Extremities: No edema  Lab Results: Recent Labs    11/06/17 1654 11/08/17 0544  WBC 16.5* 18.4*  HGB 10.1* 11.3*  HCT 32.4* 35.6*  PLT 368 477*   BMET:  Recent Labs    11/06/17 0547 11/07/17 0647  NA 134* 138  K 4.1 4.2  CL 99* 101  CO2 20* 21*  GLUCOSE 155* 163*  BUN 128* 132*  CREATININE 4.56* 4.44*  CALCIUM 8.6* 8.9   No results for input(Little): PTH in the last 72 hours. Iron Studies:  No results for input(Little): IRON, TIBC, TRANSFERRIN, FERRITIN in the last 72 hours.  Studies/Results: No results found.  I have reviewed the patient'Little current medications.  Assessment/Plan: 1] chronic renal failure: Her BUN and creatinine is high.  Patient however does not have any nausea or vomiting.  Her appetite is reasonable.  Potassium is slightly lower.   2] difficulty breathing: Possibly a combination of COPD and fluid overload.  Patient has 1800 cc of urine output.  She denies any difficulty breathing. 3] hypertension: Her blood pressure is reasonably controlled 4] diabetes: Blood sugar is reasonably controlled 5] history of lupus 6]  bone mineral disorder: Her calcium and phosphorus is range  7] anemia: Her hemoglobin remains within our target goal. Plan: 1] patient will have a tunnel catheter placed tomorrow and will dialyze her for 2 hours. 2] we will check CBC and renal panel in the morning. 3] will DC IV Lasix and start her on Lasix 80 mg p.o. once a day   LOS: 6 days   [name] 11/08/2017,9:05 AM

## 2017-11-08 NOTE — Progress Notes (Addendum)
ANTICOAGULATION CONSULT NOTE - follow up  Pharmacy Consult for Warfarin (home med) Indication: atrial fibrillation  Allergies  Allergen Reactions  . Ace Inhibitors Cough   Patient Measurements: Height: 5\' 4"  (162.6 cm) Weight: 180 lb 12.4 oz (82 kg) IBW/kg (Calculated) : 54.7  Vital Signs: Temp: 97.7 F (36.5 C) (02/10 0647) Temp Source: Oral (02/10 0647) BP: 182/56 (02/10 0647) Pulse Rate: 81 (02/10 0647)  Labs: Recent Labs    11/06/17 0547 11/06/17 1654 11/07/17 0647 11/08/17 0544  HGB  --  10.1*  --  11.3*  HCT  --  32.4*  --  35.6*  PLT  --  368  --  477*  LABPROT 38.4*  --  19.7* 17.5*  INR 3.96  --  1.68 1.45  CREATININE 4.56*  --  4.44*  --    Estimated Creatinine Clearance: 12 mL/min (A) (by C-G formula based on SCr of 4.44 mg/dL (H)).  Medical History: Past Medical History:  Diagnosis Date  . Anemia   . Arthritis   . Asthma   . Atrial fibrillation ([place])   . Atrial flutter [facility])    TEE/DCCV December 2013 - on Xarelto [facility])  . Bone spur of toe    Right 5th toe  . Chronic diastolic CHF (congestive heart failure) (HCC)    a. EF 50-55% by echo in 2015  . CKD (chronic kidney disease) stage 4, GFR 15-29 ml/min (HCC)    a. s/p fistula placement but not on HD. Followed by Nephrology.   Marland Kitchen COPD (chronic obstructive pulmonary disease) ([place])   . DVT of upper extremity (deep vein thrombosis) ([place])    Right basilic vein, June 9371  . Essential hypertension, benign   . Fractures   . SLE (systemic lupus erythematosus) ([place])   . Type 2 diabetes mellitus ([place])   . UTI (lower urinary tract infection)    Medications:  Medications Prior to Admission  Medication Sig Dispense Refill Last Dose  . acetaminophen (TYLENOL) 500 MG tablet Take 1,000 mg by mouth every 6 (six) hours as needed. For pain   Taking  . albuterol (ACCUNEB) 0.63 MG/3ML nebulizer solution Take 1 ampule by nebulization every 6 (six) hours as needed for wheezing or shortness of breath.    11/01/2017 at  Unknown time  . albuterol (PROVENTIL HFA;VENTOLIN HFA) 108 (90 BASE) MCG/ACT inhaler Inhale 2 puffs into the lungs every 6 (six) hours as needed. For shortness of breath   11/01/2017 at Unknown time  . amLODipine (NORVASC) 10 MG tablet Take 1 tablet (10 mg total) by mouth at bedtime. 30 tablet 0 11/02/2017 at Unknown time  . atorvastatin (LIPITOR) 40 MG tablet Take 40 mg at bedtime by mouth.    11/01/2017 at Unknown time  . brimonidine-timolol (COMBIGAN) 0.2-0.5 % ophthalmic solution Place 1 drop into the left eye daily.    11/01/2017 at Unknown time  . calcitRIOL (ROCALTROL) 0.25 MCG capsule Take 0.25 mcg by mouth as directed. Take on Mondays Wednesdays and Fridays   11/02/2017 at Unknown time  . diclofenac (VOLTAREN) 0.1 % ophthalmic solution Place 1 drop into the left eye daily. Uses at lunch time   11/01/2017 at Unknown time  . dicyclomine (BENTYL) 10 MG capsule Take 10 mg by mouth 4 (four) times daily -  before meals and at bedtime.   11/02/2017 at Unknown time  . febuxostat (ULORIC) 40 MG tablet Take 40 mg by mouth daily.   11/02/2017 at Unknown time  . folic acid (FOLVITE) 1 MG tablet  Take 1 mg by mouth daily.   11/02/2017 at Unknown time  . furosemide (LASIX) 80 MG tablet Take 160 mg by mouth 2 (two) times daily.    11/02/2017 at Unknown time  . HUMALOG KWIKPEN 100 UNIT/ML KiwkPen INJECT UP TO 11 UNITS SUBCUTANEOUSLY BEFORE MEALS THREE TIMES DAILY. 15 mL 2 11/02/2017 at Unknown time  . HYDROcodone-acetaminophen (NORCO/VICODIN) 5-325 MG tablet 1 or 2 tabs PO q12 hours prn pain 10 tablet 0 11/01/2017 at Unknown time  . hydroxychloroquine (PLAQUENIL) 200 MG tablet Take 200 mg by mouth as directed. Take 1 1/2 tablets daily   11/02/2017 at 900  . Insulin Detemir (LEVEMIR FLEXTOUCH) 100 UNIT/ML Pen Inject 10 Units into the skin daily at 10 pm. 5 pen 2 11/01/2017 at Unknown time  . metoprolol tartrate (LOPRESSOR) 50 MG tablet TAKE ONE TABLET BY MOUTH TWICE DAILY. 60 tablet 6 11/02/2017 at 900  . Multiple Vitamins-Minerals  (CENTRUM SILVER PO) Take 1 tablet by mouth daily.   11/02/2017 at Unknown time  . ONE TOUCH ULTRA TEST test strip TEST 4 TIMES A DAY AS DIRECTED. 150 each 5 11/02/2017 at Unknown time  . ONETOUCH DELICA LANCETS 16B MISC USE TO TEST 4 TIMES DAILY. 100 each 5 11/02/2017 at Unknown time  . predniSONE (DELTASONE) 5 MG tablet Take 5 mg by mouth daily with breakfast.   11/02/2017 at Unknown time  . travoprost, benzalkonium, (TRAVATAN) 0.004 % ophthalmic solution Place 1 drop into the left eye at bedtime.   11/01/2017 at Unknown time  . UNIFINE PENTIPS 31G X 6 MM MISC USE AS DIRECTED AT BEDTIME WITH LEVEMIR AND WITH SLIDING SCALE INSULIN. 150 each 5 11/02/2017 at Unknown time  . Vitamin D, Ergocalciferol, (DRISDOL) 50000 units CAPS capsule TAKE 1 CAPSULE BY MOUTH ONCE WEEKLY. 12 capsule 0 Past Week at Unknown time  . warfarin (COUMADIN) 5 MG tablet Take 1 tablet daily (Patient taking differently: Take 5 mg by mouth daily at 6 PM. Take 1 tablet daily) 180 tablet 3 11/01/2017 at 1800    Home dose = 5mg  daily per PTA med list.  Assessment: 72 yo woman admitted due to renal failure, acute on chronic. INR was supratherapeutic > 10 with no e/o bleeding. Vitamin K 5mg  po given x 1 on 2/7. INR this AM is trending down to 3.96--> 1.68--> 1.45. Coumadin on hold for placement of tunnel catheter at [facility] on 11/09/17. Can restart after placement.  Goal of Therapy:  INR 2-3   Plan:  No Coumadin today, plan is for HD catheter placement Daily PT/INR Monitor for s/s of bleeding  Isac Sarna, BS Vena [place], BCPS Clinical Pharmacist Pager [phone] 11/08/2017 8:40 AM

## 2017-11-08 NOTE — Progress Notes (Signed)
PROGRESS NOTE    KAELEN BRENNAN  GXQ:[id number] DOB: 18-Apr-1946 DOA: 11/02/2017 PCP: Sharilyn Sites, MD     Brief Narrative:  72 year old woman admitted from home on 2/4 due to worsening of chronic abdominal pain with nausea and vomiting.  In addition she had been complaining of a nonproductive cough and low-grade fevers.  She was found to be hypoxemic with worsening renal failure.  On 2/7 INR was found to be greater than 10.  Was scheduled to start dialysis on 2/8, however hemodialysis nurse was unable to access left upper arm AV fistula.  Nephrology has made plans to send patient to [facility] on Monday for placement of a tunneled dialysis catheter to initiate dialysis.   Assessment & Plan:   Principal Problem:   Renal failure (ARF), acute on chronic (HCC) Active Problems:   Atrial flutter (HCC)   SLE (systemic lupus erythematosus) (HCC)   Essential hypertension, benign   COPD (chronic obstructive pulmonary disease) (HCC)   Anemia of chronic renal failure, stage 4 (severe) (HCC)   Acute respiratory failure with hypoxia (HCC)   Chronic diastolic heart failure (HCC)   Focal segmental glomerulosclerosis without nephrosis or chronic glomerulonephritis   Mixed hyperlipidemia   Type 2 diabetes mellitus with stage 4 chronic kidney disease (HCC)   Iron deficiency anemia   Community acquired pneumonia   Nausea and vomiting   AKI (acute kidney injury) ([place])   Acute renal failure superimposed on stage 4 chronic kidney disease (HCC)   COPD with acute exacerbation (HCC)   Acute hypoxemic respiratory failure -Due to COPD exacerbation and presumed aspiration pneumonia/lobar pneumonia. -Her respiratory virus panel has also resulted positive for RSV. -Continue Zosyn (planned for 7 days total of treatment with antibiotics) Day 6/7, given concern for aspiration given her nausea and vomiting prior to admission, okay to DC azithromycin especially given worsening INR. -She has been weaned off  BiPAP. -Clinically improving, weaned completely off oxygen today.  COPD with acute exacerbation -Continue nebs. -Continue steroids; although continue to titrate dose, down to Solu-Medrol 60 mg daily today. -She appears to be clinically improving.  Acute on chronic kidney disease stage IV -Progressing to end-stage renal disease this admission .-Appreciate nephrology input and recommendations. -Nephrology plans to start dialysis this admission.  However HD nurse was unable to access left upper arm fistula.  Dr. Lowanda Foster has planned for patient to go to [facility] on Monday to have a tunneled dialysis catheter placement by radiology. -Has been started on oral bicarb tablets given metabolic acidosis due to advanced renal failure. -Renal biopsy in 2015 with collapsing FSGS plus severe arterial sclerosis with moderate to severe tubulointerstitial scarring and superimposed acute tubular injury.  UTI -UA with significant pyuria, unfortunately urine culture was not obtained on admission. -Zosyn should be sufficient coverage.  Abdominal pain/intractable nausea and vomiting -Has resolved, suspect viral gastroenteritis.  Chronic diastolic heart failure -Clinically appears euvolemic and compensated. -Echo from 2/5 has been reviewed: EF 55-60% with no wall motion abnormalities and grade 2 diastolic dysfunction,, PA pressure of 63.  Paroxysmal atrial fibrillation -Currently in sinus rhythm, anticoagulated on warfarin, but warfarin has been on hold in anticipation of tunneled dialysis catheter placement tomorrow. -Rate controlled on metoprolol. -INR 1.45 today. -Received vitamin K on 2/7 for an INR greater than 10.  Systemic lupus -Continue Plaquenil  Essential hypertension -Continues to have mainly isolated systolic hypertension, suspect due to edematous state from lack of hemodialysis and volume overload in addition to anxiety surrounding initiation of dialysis  this admission. -We will not add  further medications at this time as the plan is to initiate dialysis tomorrow.  Type 2 diabetes -Recent hemoglobin A1c of 5.6 demonstrates excellent control. -CBGs improving. -Should continue to improve once steroids are titrated. -Improving with initiation of Levemir.   DVT prophylaxis: Warfarin Code Status: Full code Family Communication: Husband at bedside updated on plan of care and questions answered Disposition Plan: Husband has questions about patient's disposition after discharge given the layout of their house.  We will also need to arrange outpatient slot she is newly started on hemodialysis.  Consultants:   Nephrology  Procedures:   Echo as above  Antimicrobials:  Anti-infectives (From admission, onward)   Start     Dose/Rate Route Frequency Ordered Stop   11/09/17 0900  ceFAZolin (ANCEF) IVPB 1 g/50 mL premix    Comments:  Give in IR for surgical prophylaxis   1 g 100 mL/hr over 30 Minutes Intravenous  Once 11/07/17 0957     11/03/17 2200  cefTRIAXone (ROCEPHIN) 1 g in dextrose 5 % 50 mL IVPB  Status:  Discontinued     1 g 100 mL/hr over 30 Minutes Intravenous Every 24 hours 11/02/17 2234 11/03/17 0712   11/03/17 2200  piperacillin-tazobactam (ZOSYN) IVPB 3.375 g     3.375 g 12.5 mL/hr over 240 Minutes Intravenous Every 12 hours 11/03/17 1006     11/03/17 1000  hydroxychloroquine (PLAQUENIL) tablet 300 mg    Comments:  Take 1 1/2 tablets daily     300 mg Oral Daily 11/02/17 2214     11/03/17 0900  piperacillin-tazobactam (ZOSYN) 2.25 g in dextrose 5 % 50 mL IVPB  Status:  Discontinued     2.25 g 100 mL/hr over 30 Minutes Intravenous Every 8 hours 11/03/17 0739 11/03/17 1005   11/03/17 0730  piperacillin-tazobactam (ZOSYN) IVPB 2.25 g  Status:  Discontinued     2.25 g 100 mL/hr over 30 Minutes Intravenous Every 6 hours 11/03/17 0712 11/03/17 0739   11/02/17 2300  azithromycin (ZITHROMAX) 500 mg in dextrose 5 % 250 mL IVPB  Status:  Discontinued     500 mg 250  mL/hr over 60 Minutes Intravenous Every 24 hours 11/02/17 2214 11/05/17 0940   11/02/17 2015  azithromycin (ZITHROMAX) 500 mg in dextrose 5 % 250 mL IVPB  Status:  Discontinued     500 mg 250 mL/hr over 60 Minutes Intravenous  Once 11/02/17 2004 11/02/17 2214   11/02/17 2015  cefTRIAXone (ROCEPHIN) 2 g in dextrose 5 % 50 mL IVPB     2 g 100 mL/hr over 30 Minutes Intravenous  Once 11/02/17 2004 11/02/17 2348       Subjective: In bed, no distress, still very tearful about the prospect of starting dialysis.  Objective: Vitals:   11/08/17 0706 11/08/17 0825 11/08/17 1250 11/08/17 1411  BP:   (!) 210/70   Pulse:   80   Resp:      Temp:      TempSrc:      SpO2:  94%  94%  Weight: 82 kg (180 lb 12.4 oz)     Height:        Intake/Output Summary (Last 24 hours) at 11/08/2017 1610 Last data filed at 11/08/2017 1605 Gross per 24 hour  Intake 784 ml  Output 2700 ml  Net -1916 ml   Filed Weights   11/06/17 0308 11/07/17 0533 11/08/17 0706  Weight: 85.9 kg (189 lb 6 oz) 87.8 kg (193 lb 9 oz)  82 kg (180 lb 12.4 oz)    Examination:  General exam: Alert, awake, oriented x 3 Respiratory system: Clear to auscultation. Respiratory effort normal. Cardiovascular system:RRR. No murmurs, rubs, gallops. Gastrointestinal system: Abdomen is nondistended, soft and nontender. No organomegaly or masses felt. Normal bowel sounds heard. Central nervous system: Alert and oriented. No focal neurological deficits. Extremities: 1-2+ pitting bilateral edema, +pedal pulses Skin: No rashes, lesions or ulcers Psychiatry: Judgement and insight appear normal. Mood & affect appropriate.        Data Reviewed: I have personally reviewed following labs and imaging studies  CBC: Recent Labs  Lab 11/02/17 1536 11/03/17 0208 11/03/17 0801 11/04/17 0442 11/06/17 1654 11/08/17 0544  WBC 17.7* 18.2*  --  10.8* 16.5* 18.4*  NEUTROABS 16.3*  --   --   --   --   --   HGB 10.1* 8.8* 8.5* 10.1* 10.1* 11.3*    HCT 32.2* 27.9* 27.6* 32.1* 32.4* 35.6*  MCV 87.5 86.9  --  87.7 86.2 85.6  PLT 262 279  --  272 368 301*   Basic Metabolic Panel: Recent Labs  Lab 11/04/17 0442 11/05/17 0658 11/06/17 0547 11/07/17 0647 11/08/17 0544  NA 134* 133* 134* 138 139  K 4.4 4.2 4.1 4.2 3.4*  CL 101 98* 99* 101 101  CO2 18* 20* 20* 21* 23  GLUCOSE 235* 146* 155* 163* 187*  BUN 100* 116* 128* 132* 141*  CREATININE 4.40* 4.53* 4.56* 4.44* 4.30*  CALCIUM 8.8* 8.8* 8.6* 8.9 9.0  MG  --   --  2.4  --   --   PHOS 7.5* 7.6* 7.0* 6.2* 4.9*   GFR: Estimated Creatinine Clearance: 12.4 mL/min (A) (by C-G formula based on SCr of 4.3 mg/dL (H)). Liver Function Tests: Recent Labs  Lab 11/02/17 1536 11/04/17 0442 11/05/17 0658 11/06/17 0547 11/07/17 0647 11/08/17 0544  AST 37  --   --   --   --   --   ALT 27  --   --   --   --   --   ALKPHOS 108  --   --   --   --   --   BILITOT 0.6  --   --   --   --   --   PROT 7.4  --   --   --   --   --   ALBUMIN 2.3* 2.1* 2.1* 2.0* 2.2* 2.1*   Recent Labs  Lab 11/02/17 1536  LIPASE 20   No results for input(s): AMMONIA in the last 168 hours. Coagulation Profile: Recent Labs  Lab 11/04/17 0442 11/05/17 0540 11/06/17 0547 11/07/17 0647 11/08/17 0544  INR 4.74* >10.00* 3.96 1.68 1.45   Cardiac Enzymes: Recent Labs  Lab 11/02/17 1536 11/02/17 2011 11/03/17 0208 11/03/17 0801 11/03/17 1432  TROPONINI 0.09* 0.08* 0.09* 0.09* 0.08*   BNP (last 3 results) No results for input(s): PROBNP in the last 8760 hours. HbA1C: No results for input(s): HGBA1C in the last 72 hours. CBG: Recent Labs  Lab 11/07/17 1624 11/07/17 2121 11/08/17 0053 11/08/17 0754 11/08/17 1150  GLUCAP 371* 413* 418* 134* 138*   Lipid Profile: No results for input(s): CHOL, HDL, LDLCALC, TRIG, CHOLHDL, LDLDIRECT in the last 72 hours. Thyroid Function Tests: No results for input(s): TSH, T4TOTAL, FREET4, T3FREE, THYROIDAB in the last 72 hours. Anemia Panel: No results for  input(s): VITAMINB12, FOLATE, FERRITIN, TIBC, IRON, RETICCTPCT in the last 72 hours. Urine analysis:    Component Value Date/Time  COLORURINE YELLOW 11/02/2017 1527   APPEARANCEUR CLOUDY (A) 11/02/2017 1527   LABSPEC 1.010 11/02/2017 1527   PHURINE 8.0 11/02/2017 1527   GLUCOSEU NEGATIVE 11/02/2017 1527   HGBUR NEGATIVE 11/02/2017 1527   BILIRUBINUR NEGATIVE 11/02/2017 1527   KETONESUR NEGATIVE 11/02/2017 1527   PROTEINUR 100 (A) 11/02/2017 1527   UROBILINOGEN 1.0 03/17/2014 1830   NITRITE NEGATIVE 11/02/2017 1527   LEUKOCYTESUR LARGE (A) 11/02/2017 1527   Sepsis Labs: @LABRCNTIP (procalcitonin:4,lacticidven:4)  ) Recent Results (from the past 240 hour(s))  Culture, blood (Routine X 2) w Reflex to ID Panel     Status: None   Collection Time: 11/02/17  8:11 PM  Result Value Ref Range Status   Specimen Description BLOOD RIGHT FOREARM  Final   Special Requests   Final    BOTTLES DRAWN AEROBIC ONLY Blood Culture adequate volume   Culture   Final    NO GROWTH 5 DAYS Performed at [facility], [address] [place], [place] [address]    Report Status 11/07/2017 FINAL  Final  Culture, blood (Routine X 2) w Reflex to ID Panel     Status: None   Collection Time: 11/02/17  8:11 PM  Result Value Ref Range Status   Specimen Description BLOOD RIGHT WRIST  Final   Special Requests   Final    BOTTLES DRAWN AEROBIC ONLY Blood Culture adequate volume   Culture   Final    NO GROWTH 5 DAYS Performed at [facility], [address] [place], [place] [address]    Report Status 11/07/2017 FINAL  Final  Respiratory Panel by PCR     Status: Abnormal   Collection Time: 11/02/17 10:13 PM  Result Value Ref Range Status   Adenovirus NOT DETECTED NOT DETECTED Final   Coronavirus 229E NOT DETECTED NOT DETECTED Final   Coronavirus HKU1 NOT DETECTED NOT DETECTED Final   Coronavirus NL63 NOT DETECTED NOT DETECTED Final   Coronavirus OC43 NOT DETECTED NOT DETECTED Final   Metapneumovirus NOT  DETECTED NOT DETECTED Final   Rhinovirus / Enterovirus NOT DETECTED NOT DETECTED Final   Influenza A NOT DETECTED NOT DETECTED Final   Influenza B NOT DETECTED NOT DETECTED Final   Parainfluenza Virus 1 NOT DETECTED NOT DETECTED Final   Parainfluenza Virus 2 NOT DETECTED NOT DETECTED Final   Parainfluenza Virus 3 NOT DETECTED NOT DETECTED Final   Parainfluenza Virus 4 NOT DETECTED NOT DETECTED Final   Respiratory Syncytial Virus DETECTED (A) NOT DETECTED Final    Comment: CRITICAL RESULT CALLED TO, READ BACK BY AND VERIFIED WITH: RN A HOBBS [id number]    Bordetella pertussis NOT DETECTED NOT DETECTED Final   Chlamydophila pneumoniae NOT DETECTED NOT DETECTED Final   Mycoplasma pneumoniae NOT DETECTED NOT DETECTED Final    Comment: Performed at [facility], [place] [address], [place], [place] [address]  MRSA PCR Screening     Status: None   Collection Time: 11/03/17 10:00 AM  Result Value Ref Range Status   MRSA by PCR NEGATIVE NEGATIVE Final    Comment:        The GeneXpert MRSA Assay (FDA approved for NASAL specimens only), is one component of a comprehensive MRSA colonization surveillance program. It is not intended to diagnose MRSA infection nor to guide or monitor treatment for MRSA infections. Performed at [facility], [address], [place], [place] [address]          Radiology Studies: No results found.      Scheduled Meds: .  atorvastatin  40 mg Oral QHS  . brimonidine  1 drop Left Eye Daily   And  . timolol  1 drop Left Eye Daily  . calcitRIOL  0.25 mcg Oral Once per day on Mon Wed Fri  . calcium acetate  1,334 mg Oral TID WC  . febuxostat  40 mg Oral Daily  . feeding supplement  1 Container Oral TID BM  . folic acid  1 mg Oral Daily  . hydroxychloroquine  300 mg Oral Daily  . insulin aspart  0-20 Units Subcutaneous TID WC  . insulin detemir  10 Units Subcutaneous QHS  . ipratropium-albuterol  3 mL Nebulization TID  . ketorolac  1 drop Left  Eye Daily  . latanoprost  1 drop Left Eye QHS  . mouth rinse  15 mL Mouth Rinse BID  . methylPREDNISolone (SOLU-MEDROL) injection  60 mg Intravenous Daily  . metoprolol tartrate  2.5 mg Intravenous Q6H  . pantoprazole  40 mg Oral Daily  . sodium bicarbonate  650 mg Oral TID  . tuberculin  5 Units Intradermal Once  . Warfarin - Pharmacist Dosing Inpatient   Does not apply Q24H   Continuous Infusions: . sodium chloride    . sodium chloride    . [START ON 11/09/2017]  ceFAZolin (ANCEF) IV    . furosemide Stopped (11/08/17 1054)  . piperacillin-tazobactam (ZOSYN)  IV 3.375 g (11/08/17 1236)     LOS: 6 days    Time spent: 25 minutes.    Lelon Frohlich, MD [facility] Pager [phone]  If 7PM-7AM, please contact night-coverage [email or web address] Password [email or web address] 11/08/2017, 4:10 PM

## 2017-11-08 NOTE — Progress Notes (Signed)
BP's have been elevated last several days. Takes iv metoprolol.  Can take PO.Marland Kitchen  Texted Dr. Jerilee Hoh

## 2017-11-09 ENCOUNTER — Ambulatory Visit (HOSPITAL_COMMUNITY)
Admit: 2017-11-09 | Discharge: 2017-11-09 | Disposition: A | Discharge status: Home / Self Care | Payer: Medicare Other | Attending: Nephrology | Admitting: Nephrology

## 2017-11-09 ENCOUNTER — Encounter (HOSPITAL_COMMUNITY): Payer: Self-pay | Admitting: Interventional Radiology

## 2017-11-09 DIAGNOSIS — N179 Acute kidney failure, unspecified: Secondary | ICD-10-CM | POA: Diagnosis not present

## 2017-11-09 DIAGNOSIS — Z4901 Encounter for fitting and adjustment of extracorporeal dialysis catheter: Secondary | ICD-10-CM | POA: Diagnosis not present

## 2017-11-09 HISTORY — PX: IR US GUIDE VASC ACCESS RIGHT: IMG2390

## 2017-11-09 HISTORY — PX: IR FLUORO GUIDE CV LINE RIGHT: IMG2283

## 2017-11-09 LAB — GLUCOSE, CAPILLARY
GLUCOSE-CAPILLARY: 66 mg/dL (ref 65–99)
Glucose-Capillary: 409 mg/dL — ABNORMAL HIGH (ref 65–99)
Glucose-Capillary: 313 mg/dL — ABNORMAL HIGH (ref 65–99)
Glucose-Capillary: 145 mg/dL — ABNORMAL HIGH (ref 65–99)
Glucose-Capillary: 71 mg/dL (ref 65–99)

## 2017-11-09 LAB — RENAL FUNCTION PANEL
Albumin: 2 g/dL — ABNORMAL LOW (ref 3.5–5.0)
Anion gap: 14 (ref 5–15)
BUN: 152 mg/dL — ABNORMAL HIGH (ref 6–20)
CALCIUM: 8.7 mg/dL — AB (ref 8.9–10.3)
CO2: 24 mmol/L (ref 22–32)
CREATININE: 4.08 mg/dL — AB (ref 0.44–1.00)
Chloride: 100 mmol/L — ABNORMAL LOW (ref 101–111)
GFR calc non Af Amer: 10 mL/min — ABNORMAL LOW (ref >60)
GFR, EST AFRICAN AMERICAN: 12 mL/min — AB (ref >60)
GLUCOSE: 442 mg/dL — AB (ref 65–99)
Phosphorus: 5.5 mg/dL — ABNORMAL HIGH (ref 2.5–4.6)
Potassium: 4 mmol/L (ref 3.5–5.1)
SODIUM: 138 mmol/L (ref 135–145)

## 2017-11-09 LAB — CBC
HCT: 35.6 % — ABNORMAL LOW (ref 36.0–46.0)
Hemoglobin: 11.1 g/dL — ABNORMAL LOW (ref 12.0–15.0)
MCH: 26.9 pg (ref 26.0–34.0)
MCHC: 31.2 g/dL (ref 30.0–36.0)
MCV: 86.4 fL (ref 78.0–100.0)
Platelets: 477 10*3/uL — ABNORMAL HIGH (ref 150–400)
RBC: 4.12 MIL/uL (ref 3.87–5.11)
RDW: 18.4 % — ABNORMAL HIGH (ref 11.5–15.5)
WBC: 15.9 10*3/uL — ABNORMAL HIGH (ref 4.0–10.5)

## 2017-11-09 LAB — PROTIME-INR
INR: 1.42
Prothrombin Time: 17.3 seconds — ABNORMAL HIGH (ref 11.4–15.2)

## 2017-11-09 MED ORDER — PREDNISONE 10 MG PO TABS: 10.0000 mg | ORAL_TABLET | Freq: Every day | ORAL | Status: DC

## 2017-11-09 MED ORDER — FENTANYL CITRATE (PF) 100 MCG/2ML IJ SOLN
INTRAMUSCULAR | Status: AC
  Filled 2017-11-09: qty 2

## 2017-11-09 MED ORDER — SODIUM CHLORIDE 0.9 % IV SOLN: 100.0000 mL | INTRAVENOUS | Status: DC | PRN

## 2017-11-09 MED ORDER — PREDNISONE 20 MG PO TABS
40.0000 mg | ORAL_TABLET | Freq: Every day | ORAL | Status: AC
  Administered 2017-11-12: 40 mg via ORAL
  Filled 2017-11-09 (×2): qty 2

## 2017-11-09 MED ORDER — WARFARIN SODIUM 5 MG PO TABS
5.0000 mg | ORAL_TABLET | Freq: Once | ORAL | Status: DC
  Filled 2017-11-09: qty 1

## 2017-11-09 MED ORDER — CEFAZOLIN SODIUM-DEXTROSE 2-4 GM/100ML-% IV SOLN
INTRAVENOUS | Status: AC
  Administered 2017-11-09: 2000 mg
  Filled 2017-11-09: qty 100

## 2017-11-09 MED ORDER — LIDOCAINE HCL (PF) 1 % IJ SOLN
INTRAMUSCULAR | Status: AC | PRN
  Administered 2017-11-09: 8 mL

## 2017-11-09 MED ORDER — HEPARIN SODIUM (PORCINE) 1000 UNIT/ML IJ SOLN
INTRAMUSCULAR | Status: AC
  Administered 2017-11-09: 3.2 mL
  Filled 2017-11-09: qty 1

## 2017-11-09 MED ORDER — PREDNISONE 20 MG PO TABS
30.0000 mg | ORAL_TABLET | Freq: Every day | ORAL | Status: AC
  Administered 2017-11-13: 30 mg via ORAL
  Filled 2017-11-09 (×2): qty 1

## 2017-11-09 MED ORDER — MIDAZOLAM HCL 2 MG/2ML IJ SOLN
INTRAMUSCULAR | Status: AC | PRN
  Administered 2017-11-09: 1 mg via INTRAVENOUS
  Administered 2017-11-09: 0.5 mg via INTRAVENOUS

## 2017-11-09 MED ORDER — PREDNISONE 20 MG PO TABS
60.0000 mg | ORAL_TABLET | Freq: Every day | ORAL | Status: AC
  Administered 2017-11-10: 60 mg via ORAL
  Filled 2017-11-09: qty 3

## 2017-11-09 MED ORDER — FENTANYL CITRATE (PF) 100 MCG/2ML IJ SOLN
INTRAMUSCULAR | Status: AC | PRN
  Administered 2017-11-09 (×2): 25 ug via INTRAVENOUS

## 2017-11-09 MED ORDER — MIDAZOLAM HCL 2 MG/2ML IJ SOLN
INTRAMUSCULAR | Status: AC
  Filled 2017-11-09: qty 2

## 2017-11-09 MED ORDER — FUROSEMIDE 80 MG PO TABS
80.0000 mg | ORAL_TABLET | Freq: Every day | ORAL | Status: DC
  Administered 2017-11-09 – 2017-11-13 (×5): 80 mg via ORAL
  Filled 2017-11-09 (×5): qty 1

## 2017-11-09 MED ORDER — PREDNISONE 20 MG PO TABS: 20.0000 mg | ORAL_TABLET | Freq: Every day | ORAL | Status: DC

## 2017-11-09 MED ORDER — PREDNISONE 20 MG PO TABS
50.0000 mg | ORAL_TABLET | Freq: Every day | ORAL | Status: AC
  Administered 2017-11-11: 11:00:00 50 mg via ORAL
  Filled 2017-11-09: qty 2

## 2017-11-09 MED ORDER — LIDOCAINE-EPINEPHRINE (PF) 1 %-1:200000 IJ SOLN
INTRAMUSCULAR | Status: AC
  Filled 2017-11-09: qty 30

## 2017-11-09 MED ORDER — SODIUM CHLORIDE 0.9 % IV SOLN
INTRAVENOUS | Status: AC | PRN
  Administered 2017-11-09: 10 mL/h via INTRAVENOUS

## 2017-11-09 NOTE — Progress Notes (Signed)
Inpatient Diabetes Program Recommendations  AACE/ADA: New Consensus Statement on Inpatient Glycemic Control (2015)  Target Ranges:  Prepandial:   less than 140 mg/dL      Peak postprandial:   less than 180 mg/dL (1-2 hours)      Critically ill patients:  140 - 180 mg/dL   Lab Results  Component Value Date   GLUCAP 313 (H) 11/09/2017   HGBA1C 5.6 10/20/2017    Review of Glycemic ControlResults for [name], [name] (MRN [id number]) as of 11/09/2017 13:04  Ref. Range 11/08/2017 11:50 11/08/2017 16:22 11/08/2017 21:29 11/09/2017 07:40 11/09/2017 11:37  Glucose-Capillary Latest Ref Range: 65 - 99 mg/dL 138 (H) 172 (H) 285 (H) 409 (H) 313 (H)   Diabetes history: Type 2 DM Outpatient Diabetes medications:  Humalog 11 units tid with meals, Levemir 10 units q HS Current orders for Inpatient glycemic control:  Novolog resistant tid with meals, Levemir 10 units q HS, Solumedrol 60 mg IV daily Inpatient Diabetes Program Recommendations:  Note elevated blood sugars with steroids.  Patient was on meal coverage prior to admit.    Please consider adding Novolog 4 units tid with meals.  Also please reduce Novolog correction to moderate tid with meals and HS due to ESRD. If fasting blood sugars remain>140 mg/dL, consider increasing Levemir to 12 units q HS.   Thanks,  Adah Perl, RN, BC-ADM Inpatient Diabetes Coordinator Pager [phone] (8a-5p)

## 2017-11-09 NOTE — Consult Note (Signed)
Chief Complaint: Patient was seen in consultation today for tunneled Hemo dialysis catheter placement Chief Complaint  Patient presents with  . Emesis   at the request of Dr Hinda Lenis  Supervising Physician: Sandi Mariscal  Patient Status: [facility] IP  History of Present Illness: [name] is a 72 y.o. female   N/V; abd pain General weakness Aspiration pneumonia Acute /CKD 5 Worsening Renal function She has left arm dialysis fistula-(2015)-- unable to access per RN Dr Lowanda Foster requesting tunneled dialysis catheter  Pt here at [facility] for same  Past Medical History:  Diagnosis Date  . Anemia   . Arthritis   . Asthma   . Atrial fibrillation ([place])   . Atrial flutter [facility])    TEE/DCCV December 2013 - on Xarelto [facility])  . Bone spur of toe    Right 5th toe  . Chronic diastolic CHF (congestive heart failure) (HCC)    a. EF 50-55% by echo in 2015  . CKD (chronic kidney disease) stage 4, GFR 15-29 ml/min (HCC)    a. s/p fistula placement but not on HD. Followed by Nephrology.   Marland Kitchen COPD (chronic obstructive pulmonary disease) ([place])   . DVT of upper extremity (deep vein thrombosis) ([place])    Right basilic vein, June 6629  . Essential hypertension, benign   . Fractures   . SLE (systemic lupus erythematosus) ([place])   . Type 2 diabetes mellitus ([place])   . UTI (lower urinary tract infection)     Past Surgical History:  Procedure Laterality Date  . ABDOMINAL HYSTERECTOMY  1983  . ANKLE SURGERY  1993  . AV FISTULA PLACEMENT Left 03/27/2014   Procedure: ARTERIOVENOUS (AV) FISTULA CREATION;  Surgeon: Rosetta Posner, MD;  Location: [facility];  Service: Vascular;  Laterality: Left;  . BACK SURGERY  1980  . CARDIOVASCULAR STRESS TEST  12/19/2009   no stress induced rhythm abnormalities, ekg negative for ischemia  . CARDIOVERSION  09/16/2012   Procedure: CARDIOVERSION;  Surgeon: Sanda Klein, MD;  Location: [facility] ENDOSCOPY;  Service: Cardiovascular;  Laterality: N/A;  . COLONOSCOPY   09/20/2012   Dr. [name]: multiple tubular adenomas   . COLONOSCOPY  2005   Dr. [name]. Polyps, path unknown   . COLONOSCOPY N/A 07/24/2016   Procedure: COLONOSCOPY;  Surgeon: [name], MD;  [facility];  Service: Endoscopy;  Laterality: N/A;  9:00 am  . ESOPHAGOGASTRODUODENOSCOPY  09/20/2012   Dr. [name]: duodenal erosion and possible resolving ulcer at the angularis   . TEE WITHOUT CARDIOVERSION  09/16/2012   Procedure: TRANSESOPHAGEAL ECHOCARDIOGRAM (TEE);  Surgeon: Sanda Klein, MD;  Location: [facility] ENDOSCOPY;  Service: Cardiovascular;  Laterality: N/A;  . TRANSESOPHAGEAL ECHOCARDIOGRAM WITH CARDIOVERSION  09/16/2012   EF 60-65%, moderate LVH, moderate regurg of the aortic valve, LA moderately dilated    Allergies: Ace inhibitors  Medications: Prior to Admission medications   Medication Sig Start Date End Date Taking? Authorizing Provider  acetaminophen (TYLENOL) 500 MG tablet Take 1,000 mg by mouth every 6 (six) hours as needed. For pain   Yes [provider]  albuterol (ACCUNEB) 0.63 MG/3ML nebulizer solution Take 1 ampule by nebulization every 6 (six) hours as needed for wheezing or shortness of breath.    Yes [provider]  albuterol (PROVENTIL HFA;VENTOLIN HFA) 108 (90 BASE) MCG/ACT inhaler Inhale 2 puffs into the lungs every 6 (six) hours as needed. For shortness of breath   Yes [provider]  amLODipine (NORVASC) 10 MG tablet Take 1 tablet (10  mg total) by mouth at bedtime. 03/28/14  Yes [name], MD  atorvastatin (LIPITOR) 40 MG tablet Take 40 mg at bedtime by mouth.    Yes [provider]  brimonidine-timolol (COMBIGAN) 0.2-0.5 % ophthalmic solution Place 1 drop into the left eye daily.    Yes [provider]  calcitRIOL (ROCALTROL) 0.25 MCG capsule Take 0.25 mcg by mouth as directed. Take on Mondays Wednesdays and Fridays   Yes [provider]  diclofenac (VOLTAREN) 0.1 % ophthalmic solution  Place 1 drop into the left eye daily. Uses at lunch time   Yes [provider]  dicyclomine (BENTYL) 10 MG capsule Take 10 mg by mouth 4 (four) times daily -  before meals and at bedtime.   Yes [provider]  febuxostat (ULORIC) 40 MG tablet Take 40 mg by mouth daily.   Yes [provider]  folic acid (FOLVITE) 1 MG tablet Take 1 mg by mouth daily.   Yes [provider]  furosemide (LASIX) 80 MG tablet Take 160 mg by mouth 2 (two) times daily.    Yes [provider]  HUMALOG KWIKPEN 100 UNIT/ML KiwkPen INJECT UP TO 11 UNITS SUBCUTANEOUSLY BEFORE MEALS THREE TIMES DAILY. 07/24/17  Yes Cassandria Anger, MD  HYDROcodone-acetaminophen (NORCO/VICODIN) 5-325 MG tablet 1 or 2 tabs PO q12 hours prn pain 08/31/17  Yes Francine Graven, DO  hydroxychloroquine (PLAQUENIL) 200 MG tablet Take 200 mg by mouth as directed. Take 1 1/2 tablets daily   Yes [provider]  Insulin Detemir (LEVEMIR FLEXTOUCH) 100 UNIT/ML Pen Inject 10 Units into the skin daily at 10 pm. 10/27/17  Yes [name], [name], MD  metoprolol tartrate (LOPRESSOR) 50 MG tablet TAKE ONE TABLET BY MOUTH TWICE DAILY. 10/23/17  Yes Strader, [place], PA-C  Multiple Vitamins-Minerals (CENTRUM SILVER PO) Take 1 tablet by mouth daily.   Yes [provider]  ONE TOUCH ULTRA TEST test strip TEST 4 TIMES A DAY AS DIRECTED. 10/28/17  Yes [name], [name], MD  ONETOUCH DELICA LANCETS 44H MISC USE TO TEST 4 TIMES DAILY. 09/05/15  Yes [name], [name], MD  predniSONE (DELTASONE) 5 MG tablet Take 5 mg by mouth daily with breakfast.   Yes [provider]  travoprost, benzalkonium, (TRAVATAN) 0.004 % ophthalmic solution Place 1 drop into the left eye at bedtime.   Yes [provider]  UNIFINE PENTIPS 31G X 6 MM MISC USE AS DIRECTED AT BEDTIME WITH LEVEMIR AND WITH SLIDING SCALE INSULIN. 10/16/17  Yes [name], [name], MD  Vitamin D, Ergocalciferol, (DRISDOL)  50000 units CAPS capsule TAKE 1 CAPSULE BY MOUTH ONCE WEEKLY. 09/08/17  Yes Cassandria Anger, MD  warfarin (COUMADIN) 5 MG tablet Take 1 tablet daily Patient taking differently: Take 5 mg by mouth daily at 6 PM. Take 1 tablet daily 01/26/17  Yes Satira Sark, MD     Family History  Problem Relation Age of Onset  . Diabetes Brother   . Diabetes Father   . Hypertension Father   . Hypertension Sister   . Stroke Mother   . Diabetes Sister   . Hypertension Brother   . Colon cancer Neg Hx     Social History   Socioeconomic History  . Marital status: Married    Spouse name: None  . Number of children: None  . Years of education: None  . Highest education level: None  Social Needs  . Financial resource strain: None  . Food insecurity - worry:  None  . Food insecurity - inability: None  . Transportation needs - medical: None  . Transportation needs - non-medical: None  Occupational History  . None  Tobacco Use  . Smoking status: Former Smoker    Packs/day: 1.00    Years: 30.00    Pack years: 30.00    Types: Cigarettes    Last attempt to quit: 08/29/2012    Years since quitting: 5.2  . Smokeless tobacco: Never Used  . Tobacco comment: quit 08-2012  Substance and Sexual Activity  . Alcohol use: No    Alcohol/week: 0.0 oz  . Drug use: No  . Sexual activity: Yes    Partners: Male  Other Topics Concern  . None  Social History Narrative  . None    Review of Systems: A 12 point ROS discussed and pertinent positives are indicated in the HPI above.  All other systems are negative.  Review of Systems  Constitutional: Positive for activity change, appetite change and fatigue. Negative for fever.  Respiratory: Positive for cough and shortness of breath.   Neurological: Positive for weakness.  Psychiatric/Behavioral: Positive for confusion. Negative for behavioral problems.    Vital Signs: BP (!) 141/62 (BP Location: Right Wrist)   Pulse 88   Temp 98.5 F (36.9  C) (Oral)   Resp 18   Ht 5\' 4"  (1.626 m)   Wt 180 lb 12.4 oz (82 kg)   SpO2 97%   BMI 31.03 kg/m   Physical Exam  Cardiovascular: Normal rate and regular rhythm.  Pulmonary/Chest: Effort normal and breath sounds normal.  Abdominal: Soft. Bowel sounds are normal.  Musculoskeletal: Normal range of motion.  Neurological: She is alert.  Skin: Skin is warm.  Psychiatric: She has a normal mood and affect. Her behavior is normal. Judgment and thought content normal.  Consented with husband- via phone  Nursing note and vitals reviewed.   Imaging: Ct Abdomen Pelvis Wo Contrast  Result Date: 11/02/2017 CLINICAL DATA:  Emesis for the last 3 days.  Intermittent fever. EXAM: CT ABDOMEN AND PELVIS WITHOUT CONTRAST TECHNIQUE: Multidetector CT imaging of the abdomen and pelvis was performed following the standard protocol without IV contrast. COMPARISON:  09/09/2017.  08/31/2017. FINDINGS: Lower chest: There is a small left effusion. There is segmental consolidation in the left lower lobe suggesting pneumonia. The heart is mildly enlarged. Hepatobiliary: No liver parenchymal abnormality is seen. There is some layering sludge or stones in the gallbladder. No CT evidence of cholecystitis. Pancreas: Normal Spleen: Normal Adrenals/Urinary Tract: Adrenal glands are normal. Kidneys show vascular calcifications but no acute finding. Bladder appears normal. Stomach/Bowel: No acute bowel pathology is seen. No sign of obstruction or inflammatory disease. Vascular/Lymphatic: Advanced aortic atherosclerosis. Atherosclerosis of the aortic branch vessels. IVC is normal. No retroperitoneal adenopathy. Reproductive: Previous hysterectomy.  No pelvic mass. Other: No free fluid or air. Musculoskeletal: Chronic lumbar degenerative changes. No acute bone finding. IMPRESSION: Segmental consolidation in the left lower lobe consistent with bronchopneumonia. Small amount of left pleural fluid. Sludge or gallstones in the  gallbladder. Aortic atherosclerosis. No acute bowel pathology seen. No obstruction or inflammatory finding. Electronically Signed   By: Nelson Chimes M.D.   On: 11/02/2017 18:28   Dg Tibia/fibula Right  Result Date: 11/02/2017 CLINICAL DATA:  Right lower leg ulcers. EXAM: RIGHT TIBIA AND FIBULA - 2 VIEW COMPARISON:  None. FINDINGS: There is no evidence of fracture or other focal bone lesions. Surgical screw is seen in distal right fibula. Vascular calcifications are noted. No lytic  destruction is seen to suggest osteomyelitis. IMPRESSION: No fracture or dislocation is noted. No definite evidence of osteomyelitis. Electronically Signed   By: Marijo Conception, M.D.   On: 11/02/2017 17:30   US Abdomen Complete  Result Date: 11/03/2017 CLINICAL DATA:  Emesis for 3 days and intermittent fever. Chronic abdominal pain. EXAM: ABDOMEN ULTRASOUND COMPLETE COMPARISON:  Abdominal CT 11/02/2017 FINDINGS: Gallbladder: Small amount of echogenic sludge within the gallbladder. No gallbladder wall distention or wall thickening. Reportedly, the patient does not have a sonographic Murphy sign. No gallstones. Common bile duct: Diameter: 0.4 cm. Liver: No focal lesion identified. Within normal limits in parenchymal echogenicity. Portal vein is patent on color Doppler imaging with normal direction of blood flow towards the liver. IVC: No abnormality visualized. Pancreas: Visualized portion unremarkable. Spleen: Size and appearance within normal limits. Right Kidney: Length: 11.6 cm. Increased echogenicity in the right kidney without hydronephrosis. There is a indeterminate hypoechoic structure along the lateral upper pole region. This structure measures 2.6 x 2.4 x 2.8 cm. Structure may be partially cystic but cannot exclude a solid component. Review of the prior non contrast CT demonstrates minimal fullness in this area but there is not an obvious lesion based on the non contrast images. Left Kidney: Length: 10.1 cm. Echogenicity  within normal limits. No mass or hydronephrosis visualized. Abdominal aorta: No aneurysm visualized. Other findings: None. IMPRESSION: Indeterminate right renal lesion measuring up to 2.8 cm. This could represent a complex cystic lesion but cannot exclude a solid component. This lesion is not well characterized on the prior non contrast CTs. Patient is not a candidate for contrast enhanced studies due to renal function but this lesion could be further characterized with a non contrast renal MRI. Gallbladder sludge without stones or biliary dilatation. Electronically Signed   By: Markus Daft M.D.   On: 11/03/2017 11:50   Dg Chest Port 1 View  Result Date: 11/03/2017 CLINICAL DATA:  Respiratory failure EXAM: PORTABLE CHEST 1 VIEW COMPARISON:  November 12, 2017 FINDINGS: There is stable cardiomegaly with mild pulmonary venous hypertension. There is atelectatic change in the left base. No frank edema or consolidation evident. There is aortic atherosclerosis. No bone lesions appreciable. IMPRESSION: Evidence of a degree of pulmonary vascular congestion without frank edema or consolidation. There is left base atelectasis. There is aortic atherosclerosis. Aortic Atherosclerosis (ICD10-I70.0). Electronically Signed   By: Lowella Grip III M.D.   On: 11/03/2017 08:37   Dg Chest Port 1 View  Result Date: 11/02/2017 CLINICAL DATA:  Shortness of breath and cough. EXAM: PORTABLE CHEST 1 VIEW COMPARISON:  August 13, 2017 FINDINGS: Stable cardiomegaly. Mild edema. No other interval changes or acute abnormalities. IMPRESSION: Cardiomegaly and mild edema. Electronically Signed   By: Dorise Bullion III M.D   On: 11/02/2017 19:11   US Breast Ltd Uni Right Inc Axilla  Result Date: 10/13/2017 CLINICAL DATA:  Screening recall for right axillary mass. Patient reports she has a "blackhead" in this location. EXAM: 2D DIGITAL DIAGNOSTIC UNILATERAL RIGHT MAMMOGRAM WITH CAD AND ADJUNCT TOMO ULTRASOUND RIGHT AXILLA COMPARISON:   Previous exam(s). ACR Breast Density Category b: There are scattered areas of fibroglandular density. FINDINGS: Spot compression MLO tomograms were performed of the right axilla demonstrating a large oval circumscribed lobulated mass measuring up to 2.9 cm located very close to the skin surface. Mammographic images were processed with CAD. Physical examination of the right axilla reveals numerous, approximately 6-8, nodules in the skin containing central black pores compatible with sebaceous cysts. Targeted  ultrasound of the right axilla was performed demonstrating an oval circumscribed hypoechoic mass with posterior acoustic enhancement within the skin measuring 1.3 x 0.9 x 1.3 cm. An adjacent similar appearing nodule measures 1.4 x 0.5 x 1.4 cm. Findings are most compatible with benign sebaceous cyst. IMPRESSION: Multiple sebaceous cysts in the right axilla. There are no findings of malignancy in the right breast. RECOMMENDATION: 1. Clinical follow-up as needed for multiple sebaceous cysts in the right axilla. 2. Recommend annual routine screening mammography, due December 2019. I have discussed the findings and recommendations with the patient. Results were also provided in writing at the conclusion of the visit. If applicable, a reminder letter will be sent to the patient regarding the next appointment. BI-RADS CATEGORY  2: Benign. Electronically Signed   By: Everlean Alstrom M.D.   On: 10/13/2017 11:13   Mm Diag Breast Tomo Uni Right  Result Date: 10/13/2017 CLINICAL DATA:  Screening recall for right axillary mass. Patient reports she has a "blackhead" in this location. EXAM: 2D DIGITAL DIAGNOSTIC UNILATERAL RIGHT MAMMOGRAM WITH CAD AND ADJUNCT TOMO ULTRASOUND RIGHT AXILLA COMPARISON:  Previous exam(s). ACR Breast Density Category b: There are scattered areas of fibroglandular density. FINDINGS: Spot compression MLO tomograms were performed of the right axilla demonstrating a large oval circumscribed  lobulated mass measuring up to 2.9 cm located very close to the skin surface. Mammographic images were processed with CAD. Physical examination of the right axilla reveals numerous, approximately 6-8, nodules in the skin containing central black pores compatible with sebaceous cysts. Targeted ultrasound of the right axilla was performed demonstrating an oval circumscribed hypoechoic mass with posterior acoustic enhancement within the skin measuring 1.3 x 0.9 x 1.3 cm. An adjacent similar appearing nodule measures 1.4 x 0.5 x 1.4 cm. Findings are most compatible with benign sebaceous cyst. IMPRESSION: Multiple sebaceous cysts in the right axilla. There are no findings of malignancy in the right breast. RECOMMENDATION: 1. Clinical follow-up as needed for multiple sebaceous cysts in the right axilla. 2. Recommend annual routine screening mammography, due December 2019. I have discussed the findings and recommendations with the patient. Results were also provided in writing at the conclusion of the visit. If applicable, a reminder letter will be sent to the patient regarding the next appointment. BI-RADS CATEGORY  2: Benign. Electronically Signed   By: Everlean Alstrom M.D.   On: 10/13/2017 11:13    Labs:  CBC: Recent Labs    11/04/17 0442 11/06/17 1654 11/08/17 0544 11/09/17 0459  WBC 10.8* 16.5* 18.4* 15.9*  HGB 10.1* 10.1* 11.3* 11.1*  HCT 32.1* 32.4* 35.6* 35.6*  PLT 272 368 477* 477*    COAGS: Recent Labs    11/06/17 0547 11/07/17 0647 11/08/17 0544 11/09/17 0459  INR 3.96 1.68 1.45 1.42    BMP: Recent Labs    11/06/17 0547 11/07/17 0647 11/08/17 0544 11/09/17 0459  NA 134* 138 139 138  K 4.1 4.2 3.4* 4.0  CL 99* 101 101 100*  CO2 20* 21* 23 24  GLUCOSE 155* 163* 187* 442*  BUN 128* 132* 141* 152*  CALCIUM 8.6* 8.9 9.0 8.7*  CREATININE 4.56* 4.44* 4.30* 4.08*  GFRNONAA 9* 9* 9* 10*  GFRAA 10* 11* 11* 12*    LIVER FUNCTION TESTS: Recent Labs    08/13/17 1021  10/20/17 1029 10/21/17 1027 11/02/17 1536  11/06/17 0547 11/07/17 0647 11/08/17 0544 11/09/17 0459  BILITOT 0.9 0.2 0.3 0.6  --   --   --   --   --  AST 25 24 28  37  --   --   --   --   --   ALT 22 24 25 27   --   --   --   --   --   ALKPHOS 81 69 56 108  --   --   --   --   --   PROT 6.9 6.6 7.1 7.4  --   --   --   --   --   ALBUMIN 3.1* 3.3* 2.8* 2.3*   < > 2.0* 2.2* 2.1* 2.0*   < > = values in this interval not displayed.    TUMOR MARKERS: No results for input(s): AFPTM, CEA, CA199, CHROMGRNA in the last 8760 hours.  Assessment and Plan:  Minimally high wbc-- on prednisone A/CKD 5 Worsening renal failure Left arm fistula- unable to be accessed per RN Scheduled for tunneled dialysis catheter placement Risks and benefits discussed with the patient's husband via phone including, but not limited to bleeding, infection, vascular injury, pneumothorax which may require chest tube placement, air embolism or even death  All of the patient's husbands questions were answered, patient is agreeable to proceed. Consent signed and in chart.  Thank you for this interesting consult.  I greatly enjoyed meeting YANIL DAWE and look forward to participating in their care.  A copy of this report was sent to the requesting provider on this date.  Electronically Signed: Lavonia Drafts, PA-C 11/09/2017, 12:56 PM   I spent a total of 20 Minutes    in face to face in clinical consultation, greater than 50% of which was counseling/coordinating care for tunneled HD catheter placement

## 2017-11-09 NOTE — Progress Notes (Signed)
Subjective: Interval History: Patient still complains of feeling thirsty otherwise feels okay.  She denies any difficulty breathing.  Patient also denies any nausea or vomiting.  Objective: Vital signs in last 24 hours: Temp:  [98.5 F (36.9 C)-98.6 F (37 C)] 98.5 F (36.9 C) (02/11 0456) Pulse Rate:  [80-88] 88 (02/11 0456) Resp:  [16-18] 18 (02/11 0456) BP: (141-210)/(55-70) 141/62 (02/11 0456) SpO2:  [94 %-100 %] 97 % (02/11 0731) Weight change:   Intake/Output from previous day: 02/10 0701 - 02/11 0700 In: 584 [P.O.:360; IV Piggyback:224] Out: 2250 [Urine:2250] Intake/Output this shift: No intake/output data recorded.  General appearance: alert, cooperative and no distress Resp: diminished breath sounds bilaterally Cardio: regular rate and rhythm Extremities: No edema  Lab Results: Recent Labs    11/08/17 0544 11/09/17 0459  WBC 18.4* 15.9*  HGB 11.3* 11.1*  HCT 35.6* 35.6*  PLT 477* 477*   BMET:  Recent Labs    11/08/17 0544 11/09/17 0459  NA 139 138  K 3.4* 4.0  CL 101 100*  CO2 23 24  GLUCOSE 187* 442*  BUN 141* 152*  CREATININE 4.30* 4.08*  CALCIUM 9.0 8.7*   No results for input(s): PTH in the last 72 hours. Iron Studies:  No results for input(s): IRON, TIBC, TRANSFERRIN, FERRITIN in the last 72 hours.  Studies/Results: No results found.  I have reviewed the patient's current medications.  Assessment/Plan: 1] chronic renal failure: Her potassium is normal.  Her BUN is increasing but creatinine is stable.  Presently patient does not have any nausea or vomiting. 2] difficulty breathing: Possibly a combination of COPD and fluid overload.  She has 2200 cc of urine and patient presently denies any difficulty in breathing. 3] hypertension: Her blood pressure is reasonably controlled 4] diabetes: Blood sugar is reasonably controlled 5] history of lupus 6] bone mineral disorder: Her calcium  is range but her phosphorus is slightly high today. 7]  anemia: Her hemoglobin remains within our target goal. Plan: 1] will make arrangement for patient to give tunnel catheter and then initiate dialysis today for 2 hours. 2] we will check a renal panel in the morning. 3] on discharge patient will be going to [facility] outpatient dialysis unit.   LOS: 7 days   Carrera Kiesel S 11/09/2017,8:01 AM

## 2017-11-09 NOTE — Progress Notes (Signed)
Physical Therapy Treatment Patient Details Name: [name] MRN: [id number] DOB: [birth year] Today's Date: 11/09/2017    History of Present Illness [name] is a 72 y.o. female PMH: CKD-IV c LUE fistula , CHF, hypo-Fe anemia, AF on warfarin, COPD, HTN, DM2, and SLE, presents to ED from PCP d/t 2-3d fever, ABD pain, emesis, nausea. [name]  Pt denies blood in emesis or stools and states ABD pain is chronic x6 months.  Prior ABD CT unremarkable, with gastroc FU on 2/14.  H/o chronic RLE wounds managed at home. Pt is scheduled for IR at [facility] on 2/11 for tunneled catheter placement.      PT Comments    Upon arrival, patient somewhat somnolent and intermittently tearful, c/o continued Right flank pain as per baseline, as well as anxiety regarding her current state of illness and expressing some hopelessness about her condition. Pt offered emotional support and encouragement and she is responsive. She participates in bed level exercises, with 3+/5 to 4/5 strength globally, Dana she requires heavy verbal and tactile cues to attend to take of exercises, unable to perform independently without close supervision. Pt is motivated to participate and thanks PT multiple times for PT session. No high effort mobility pursued at this time as patient is to be transported to [facility] in a few hours for HD catheter placement.      Follow Up Recommendations  SNF     Equipment Recommendations  Little recommended by PT    Recommendations for Other Services       Precautions / Restrictions Precautions Precautions: Fall Restrictions Weight Bearing Restrictions: No    Mobility  Bed Mobility Overal bed mobility: (not pursued this date d/t current pain and pending procedure. )                Transfers                    Ambulation/Gait                 Stairs            Wheelchair Mobility    Modified Rankin (Stroke  Patients Only)       Balance                                            Cognition Arousal/Alertness: Awake/alert Behavior During Therapy: (intermittently tearful and anxious about current state of health. ) Overall Cognitive Status: Difficult to assess                                 General Comments: requires constant heavy verbal and tactile cues to attend to task of bed exercises.       Exercises General Exercises - Lower Extremity Ankle Circles/Pumps: Both;15 reps;AAROM;Supine Short Arc Quad: Supine;AAROM;Both;10 reps Heel Slides: Supine;Both;10 reps;AROM Hip ABduction/ADduction: Supine;Both;10 reps;AROM Hip Flexion/Marching: Supine;AROM;10 reps;Both Mini-Sqauts: Supine;Strengthening;Both;10 reps(manually resisted) Shoulder Exercises Shoulder Flexion: AAROM;Both;10 reps    General Comments        Pertinent Vitals/Pain Pain Assessment: 0-10 Pain Score: 10-Worst pain ever Pain Location: right flank (right side of lower abdomen) Pain Descriptors / Indicators: Aching Pain Intervention(s): Limited activity within patient's tolerance;Monitored during session;Repositioned    Home Living  Prior Function            PT Goals (current goals can now be found in the care plan section) Acute Rehab PT Goals Patient Stated Goal: return home after rehab PT Goal Formulation: With patient Time For Goal Achievement: 11/13/17 Potential to Achieve Goals: Good Progress towards PT goals: PT to reassess next treatment(declining d/t in inability to dialyze; anticipated improvement after HD cath placement to day)    Frequency    Min 3X/week      PT Plan Current plan remains appropriate    Co-evaluation              AM-PAC PT "6 Clicks" Daily Activity  Outcome Measure  Difficulty turning over in bed (including adjusting bedclothes, sheets and blankets)?: A Lot Difficulty moving from lying on back to  sitting on the side of the bed? : A Lot Difficulty sitting down on and standing up from a chair with arms (e.g., wheelchair, bedside commode, etc,.)?: Unable Help needed moving to and from a bed to chair (including a wheelchair)?: Total Help needed walking in hospital room?: Total Help needed climbing 3-5 steps with a railing? : Total 6 Click Score: 8    End of Session   Activity Tolerance: Patient limited by fatigue;Patient limited by pain Patient left: in bed;with call bell/phone within reach;with bed alarm set Nurse Communication: Mobility status PT Visit Diagnosis: Unsteadiness on feet (R26.81);Other abnormalities of gait and mobility (R26.89);Muscle weakness (generalized) (M62.81)     Time: 7972-8206 PT Time Calculation (min) (ACUTE ONLY): 20 min  Charges:  $Therapeutic Activity: 8-22 mins                    G Codes:       9:29 AM, Dec 07, 2017 Etta Grandchild, PT, DPT Physical Therapist - [place] [phone] (Office)     Rudolfo Brandow C 12/07/2017, 9:25 AM

## 2017-11-09 NOTE — Progress Notes (Signed)
ANTICOAGULATION CONSULT NOTE - follow up  Pharmacy Consult for Warfarin (home med) Indication: atrial fibrillation  Allergies  Allergen Reactions  . Ace Inhibitors Cough   Patient Measurements: Height: 5\' 4"  (162.6 cm) Weight: 180 lb 12.4 oz (82 kg) IBW/kg (Calculated) : 54.7  Vital Signs: Temp: 98.5 F (36.9 C) (02/11 0456) Temp Source: Oral (02/11 0456) BP: 183/63 (02/11 1356) Pulse Rate: 86 (02/11 1356)  Labs: Recent Labs    11/06/17 1654 11/07/17 0647 11/08/17 0544 11/09/17 0459  HGB 10.1*  --  11.3* 11.1*  HCT 32.4*  --  35.6* 35.6*  PLT 368  --  477* 477*  LABPROT  --  19.7* 17.5* 17.3*  INR  --  1.68 1.45 1.42  CREATININE  --  4.44* 4.30* 4.08*   Estimated Creatinine Clearance: 13.1 mL/min (A) (by C-G formula based on SCr of 4.08 mg/dL (H)).  Medical History: Past Medical History:  Diagnosis Date  . Anemia   . Arthritis   . Asthma   . Atrial fibrillation ([place])   . Atrial flutter [facility])    TEE/DCCV December 2013 - on Xarelto [facility])  . Bone spur of toe    Right 5th toe  . Chronic diastolic CHF (congestive heart failure) (HCC)    a. EF 50-55% by echo in 2015  . CKD (chronic kidney disease) stage 4, GFR 15-29 ml/min (HCC)    a. s/p fistula placement but not on HD. Followed by Nephrology.   Marland Kitchen COPD (chronic obstructive pulmonary disease) ([place])   . DVT of upper extremity (deep vein thrombosis) ([place])    Right basilic vein, June 2423  . Essential hypertension, benign   . Fractures   . SLE (systemic lupus erythematosus) ([place])   . Type 2 diabetes mellitus ([place])   . UTI (lower urinary tract infection)    Medications:  Medications Prior to Admission  Medication Sig Dispense Refill Last Dose  . acetaminophen (TYLENOL) 500 MG tablet Take 1,000 mg by mouth every 6 (six) hours as needed. For pain   Taking  . albuterol (ACCUNEB) 0.63 MG/3ML nebulizer solution Take 1 ampule by nebulization every 6 (six) hours as needed for wheezing or shortness of breath.     11/01/2017 at Unknown time  . albuterol (PROVENTIL HFA;VENTOLIN HFA) 108 (90 BASE) MCG/ACT inhaler Inhale 2 puffs into the lungs every 6 (six) hours as needed. For shortness of breath   11/01/2017 at Unknown time  . amLODipine (NORVASC) 10 MG tablet Take 1 tablet (10 mg total) by mouth at bedtime. 30 tablet 0 11/02/2017 at Unknown time  . atorvastatin (LIPITOR) 40 MG tablet Take 40 mg at bedtime by mouth.    11/01/2017 at Unknown time  . brimonidine-timolol (COMBIGAN) 0.2-0.5 % ophthalmic solution Place 1 drop into the left eye daily.    11/01/2017 at Unknown time  . calcitRIOL (ROCALTROL) 0.25 MCG capsule Take 0.25 mcg by mouth as directed. Take on Mondays Wednesdays and Fridays   11/02/2017 at Unknown time  . diclofenac (VOLTAREN) 0.1 % ophthalmic solution Place 1 drop into the left eye daily. Uses at lunch time   11/01/2017 at Unknown time  . dicyclomine (BENTYL) 10 MG capsule Take 10 mg by mouth 4 (four) times daily -  before meals and at bedtime.   11/02/2017 at Unknown time  . febuxostat (ULORIC) 40 MG tablet Take 40 mg by mouth daily.   11/02/2017 at Unknown time  . folic acid (FOLVITE) 1 MG tablet Take 1 mg by mouth daily.  11/02/2017 at Unknown time  . furosemide (LASIX) 80 MG tablet Take 160 mg by mouth 2 (two) times daily.    11/02/2017 at Unknown time  . HUMALOG KWIKPEN 100 UNIT/ML KiwkPen INJECT UP TO 11 UNITS SUBCUTANEOUSLY BEFORE MEALS THREE TIMES DAILY. 15 mL 2 11/02/2017 at Unknown time  . HYDROcodone-acetaminophen (NORCO/VICODIN) 5-325 MG tablet 1 or 2 tabs PO q12 hours prn pain 10 tablet 0 11/01/2017 at Unknown time  . hydroxychloroquine (PLAQUENIL) 200 MG tablet Take 200 mg by mouth as directed. Take 1 1/2 tablets daily   11/02/2017 at 900  . Insulin Detemir (LEVEMIR FLEXTOUCH) 100 UNIT/ML Pen Inject 10 Units into the skin daily at 10 pm. 5 pen 2 11/01/2017 at Unknown time  . metoprolol tartrate (LOPRESSOR) 50 MG tablet TAKE ONE TABLET BY MOUTH TWICE DAILY. 60 tablet 6 11/02/2017 at 900  . Multiple  Vitamins-Minerals (CENTRUM SILVER PO) Take 1 tablet by mouth daily.   11/02/2017 at Unknown time  . ONE TOUCH ULTRA TEST test strip TEST 4 TIMES A DAY AS DIRECTED. 150 each 5 11/02/2017 at Unknown time  . ONETOUCH DELICA LANCETS 42V MISC USE TO TEST 4 TIMES DAILY. 100 each 5 11/02/2017 at Unknown time  . predniSONE (DELTASONE) 5 MG tablet Take 5 mg by mouth daily with breakfast.   11/02/2017 at Unknown time  . travoprost, benzalkonium, (TRAVATAN) 0.004 % ophthalmic solution Place 1 drop into the left eye at bedtime.   11/01/2017 at Unknown time  . UNIFINE PENTIPS 31G X 6 MM MISC USE AS DIRECTED AT BEDTIME WITH LEVEMIR AND WITH SLIDING SCALE INSULIN. 150 each 5 11/02/2017 at Unknown time  . Vitamin D, Ergocalciferol, (DRISDOL) 50000 units CAPS capsule TAKE 1 CAPSULE BY MOUTH ONCE WEEKLY. 12 capsule 0 Past Week at Unknown time  . warfarin (COUMADIN) 5 MG tablet Take 1 tablet daily (Patient taking differently: Take 5 mg by mouth daily at 6 PM. Take 1 tablet daily) 180 tablet 3 11/01/2017 at 1800    Home dose = 5mg  daily per PTA med list.  Assessment: 72 yo woman admitted due to renal failure, acute on chronic. INR was supratherapeutic > 10 with no e/o bleeding. Vitamin K 5mg  po given x 1 on 2/7.  Coumadin on hold for placement of tunnel catheter at [facility] on 11/09/17 which was completed. Will restart Coumadin now.  Goal of Therapy:  INR 2-3   Plan:  Coumadin 5mg  x 1 today Daily PT/INR Monitor for s/s of bleeding  Isac Sarna, BS [place], [place] Clinical Pharmacist Pager [phone] 11/09/2017 3:42 PM

## 2017-11-09 NOTE — Procedures (Signed)
    INITIAL HEMODIALYSIS TREATMENT NOTE:  First ever (2-hour) heparin-free dialysis completed via newly placed right IJ tunneled catheter. Kept even with NO ultrafiltration, per nephrologist's order.  Cath tolerated Qb at prescribed rate with stable pressures.  SBPs 150-160s throughout HD session.  All blood was returned.  Report given to Barbra Sarks, RN.  Husband asking which outpatient dialysis center pt will attend and, again, stating concerns about transportation.  He will f/u with SW tomorrow.   Rockwell Alexandria, RN, CDN

## 2017-11-09 NOTE — Progress Notes (Signed)
Patient ID: [name], female   DOB: 1946/03/09, 72 y.o.   MRN: [id number]   Scheduled for tunneled dialysis catheter placement today To be at [facility] today via ambulance by 12 noon  RN aware  Pt will return to [facility] after IR procedure

## 2017-11-09 NOTE — Procedures (Signed)
Pre-procedure Diagnosis: ESRD Post-procedure Diagnosis: Same  Successful placement of tunneled HD catheter with tips terminating within the superior aspect of the right atrium.    Complications: None Immediate  EBL: Minimal   The catheter is ready for immediate use.   Jay Joleene Burnham, MD Pager #: [phone]   

## 2017-11-09 NOTE — Progress Notes (Signed)
PROGRESS NOTE    [name]  NWG:[id number] DOB: [birth year] DOA: 11/02/2017 PCP: Sharilyn Sites, MD     Brief Narrative:  72 year old woman admitted from home on 2/4 due to worsening of chronic abdominal pain with nausea and vomiting.  In addition she had been complaining of a nonproductive cough and low-grade fevers.  She was found to be hypoxemic with worsening renal failure.  On 2/7 INR was found to be greater than 10.  Was scheduled to start dialysis on 2/8, however hemodialysis nurse was unable to access left upper arm AV fistula.  Nephrology has made plans to send patient to [facility] on 2/11 for placement of a tunneled dialysis catheter to initiate dialysis.   Assessment & Plan:   Principal Problem:   Renal failure (ARF), acute on chronic (HCC) Active Problems:   Atrial flutter (HCC)   SLE (systemic lupus erythematosus) (HCC)   Essential hypertension, benign   COPD (chronic obstructive pulmonary disease) (HCC)   Anemia of chronic renal failure, stage 4 (severe) (HCC)   Acute respiratory failure with hypoxia (HCC)   Chronic diastolic heart failure (HCC)   Focal segmental glomerulosclerosis without nephrosis or chronic glomerulonephritis   Mixed hyperlipidemia   Type 2 diabetes mellitus with stage 4 chronic kidney disease (HCC)   Iron deficiency anemia   Community acquired pneumonia   Nausea and vomiting   AKI (acute kidney injury) ([place])   Acute renal failure superimposed on stage 4 chronic kidney disease (HCC)   COPD with acute exacerbation (HCC)   Acute hypoxemic respiratory failure -Due to COPD exacerbation and presumed aspiration pneumonia/lobar pneumonia. -Her respiratory virus panel has also resulted positive for RSV. -Continue Zosyn (planned for 7 days total of treatment with antibiotics) Day 7/7, given concern for aspiration given her nausea and vomiting prior to admission, okay to DC azithromycin especially given worsening INR. -She has been weaned off  BiPAP. -Clinically improving, weaned completely off oxygen today.  COPD with acute exacerbation -Continue nebs. -Continue steroid titration. Change to prednisone 60 mg today. -She appears to be clinically improving.  Acute on chronic kidney disease stage IV -Progressing to end-stage renal disease this admission .-Appreciate nephrology input and recommendations. -Nephrology plans to start dialysis this admission.  However HD nurse was unable to access left upper arm fistula.  Dr. Lowanda Foster has planned for patient to go to [facility] today to have a tunneled dialysis catheter placement by radiology. After that will have her first HD session. -Has been started on oral bicarb tablets given metabolic acidosis due to advanced renal failure. -Renal biopsy in 2015 with collapsing FSGS plus severe arterial sclerosis with moderate to severe tubulointerstitial scarring and superimposed acute tubular injury.  UTI -UA with significant pyuria, unfortunately urine culture was not obtained on admission. -Has completed treatment with 7 days of zosyn.  Abdominal pain/intractable nausea and vomiting -Has resolved, suspect viral gastroenteritis.  Chronic diastolic heart failure -Clinically appears euvolemic and compensated. -Echo from 2/5 has been reviewed: EF 55-60% with no wall motion abnormalities and grade 2 diastolic dysfunction,, PA pressure of 63.  Paroxysmal atrial fibrillation -Currently in sinus rhythm, anticoagulated on warfarin, but warfarin has been on hold in anticipation of tunneled dialysis catheter placement tomorrow. -Rate controlled on metoprolol. -INR 1.45 today. -Received vitamin K on 2/7 for an INR greater than 10.  Systemic lupus -Continue Plaquenil  Essential hypertension -Continues to have mainly isolated systolic hypertension, suspect due to edematous state from lack of hemodialysis and volume overload in addition to  anxiety surrounding initiation of dialysis this  admission. -We will not add further medications at this time as the plan is to initiate dialysis tomorrow.  Type 2 diabetes -Recent hemoglobin A1c of 5.6 demonstrates excellent control. -CBGs improving. -Should continue to improve once steroids are titrated. -Improving with initiation of Levemir.   DVT prophylaxis: Warfarin Code Status: Full code Family Communication: Patient only Disposition Plan: SNF once medically ready. Will also need HD OP slot prior to DC.  Consultants:   Nephrology  Procedures:   Echo as above  Antimicrobials:  Anti-infectives (From admission, onward)   Start     Dose/Rate Route Frequency Ordered Stop   11/09/17 0900  ceFAZolin (ANCEF) IVPB 1 g/50 mL premix    Comments:  Give in IR for surgical prophylaxis   1 g 100 mL/hr over 30 Minutes Intravenous  Once 11/07/17 0957     11/03/17 2200  cefTRIAXone (ROCEPHIN) 1 g in dextrose 5 % 50 mL IVPB  Status:  Discontinued     1 g 100 mL/hr over 30 Minutes Intravenous Every 24 hours 11/02/17 2234 11/03/17 0712   11/03/17 2200  piperacillin-tazobactam (ZOSYN) IVPB 3.375 g     3.375 g 12.5 mL/hr over 240 Minutes Intravenous Every 12 hours 11/03/17 1006     11/03/17 1000  hydroxychloroquine (PLAQUENIL) tablet 300 mg    Comments:  Take 1 1/2 tablets daily     300 mg Oral Daily 11/02/17 2214     11/03/17 0900  piperacillin-tazobactam (ZOSYN) 2.25 g in dextrose 5 % 50 mL IVPB  Status:  Discontinued     2.25 g 100 mL/hr over 30 Minutes Intravenous Every 8 hours 11/03/17 0739 11/03/17 1005   11/03/17 0730  piperacillin-tazobactam (ZOSYN) IVPB 2.25 g  Status:  Discontinued     2.25 g 100 mL/hr over 30 Minutes Intravenous Every 6 hours 11/03/17 0712 11/03/17 0739   11/02/17 2300  azithromycin (ZITHROMAX) 500 mg in dextrose 5 % 250 mL IVPB  Status:  Discontinued     500 mg 250 mL/hr over 60 Minutes Intravenous Every 24 hours 11/02/17 2214 11/05/17 0940   11/02/17 2015  azithromycin (ZITHROMAX) 500 mg in dextrose 5 %  250 mL IVPB  Status:  Discontinued     500 mg 250 mL/hr over 60 Minutes Intravenous  Once 11/02/17 2004 11/02/17 2214   11/02/17 2015  cefTRIAXone (ROCEPHIN) 2 g in dextrose 5 % 50 mL IVPB     2 g 100 mL/hr over 30 Minutes Intravenous  Once 11/02/17 2004 11/02/17 2348       Subjective: No distress. Seems tearful and anxious about starting HD.  Objective: Vitals:   11/08/17 2003 11/08/17 2125 11/09/17 0456 11/09/17 0731  BP:  (!) 157/55 (!) 141/62   Pulse:  88 88   Resp:  18 18   Temp:  98.5 F (36.9 C) 98.5 F (36.9 C)   TempSrc:  Oral Oral   SpO2: 97% 100% 100% 97%  Weight:      Height:        Intake/Output Summary (Last 24 hours) at 11/09/2017 1404 Last data filed at 11/09/2017 1144 Gross per 24 hour  Intake 274 ml  Output 2600 ml  Net -2326 ml   Filed Weights   11/06/17 0308 11/07/17 0533 11/08/17 0706  Weight: 85.9 kg (189 lb 6 oz) 87.8 kg (193 lb 9 oz) 82 kg (180 lb 12.4 oz)    Examination:  General exam: Alert, awake, oriented x 3 Respiratory system: Clear to  auscultation. Respiratory effort normal. Cardiovascular system:RRR. No murmurs, rubs, gallops. Gastrointestinal system: Abdomen is nondistended, soft and nontender. No organomegaly or masses felt. Normal bowel sounds heard. Central nervous system: Alert and oriented. No focal neurological deficits. Extremities: No C/C/E, +pedal pulses Skin: No rashes, lesions or ulcers Psychiatry: Judgement and insight appear normal. Mood & affect appropriate.          Data Reviewed: I have personally reviewed following labs and imaging studies  CBC: Recent Labs  Lab 11/02/17 1536 11/03/17 0208 11/03/17 0801 11/04/17 0442 11/06/17 1654 11/08/17 0544 11/09/17 0459  WBC 17.7* 18.2*  --  10.8* 16.5* 18.4* 15.9*  NEUTROABS 16.3*  --   --   --   --   --   --   HGB 10.1* 8.8* 8.5* 10.1* 10.1* 11.3* 11.1*  HCT 32.2* 27.9* 27.6* 32.1* 32.4* 35.6* 35.6*  MCV 87.5 86.9  --  87.7 86.2 85.6 86.4  PLT 262 279  --   272 368 477* 161*   Basic Metabolic Panel: Recent Labs  Lab 11/05/17 0658 11/06/17 0547 11/07/17 0647 11/08/17 0544 11/09/17 0459  NA 133* 134* 138 139 138  K 4.2 4.1 4.2 3.4* 4.0  CL 98* 99* 101 101 100*  CO2 20* 20* 21* 23 24  GLUCOSE 146* 155* 163* 187* 442*  BUN 116* 128* 132* 141* 152*  CREATININE 4.53* 4.56* 4.44* 4.30* 4.08*  CALCIUM 8.8* 8.6* 8.9 9.0 8.7*  MG  --  2.4  --   --   --   PHOS 7.6* 7.0* 6.2* 4.9* 5.5*   GFR: Estimated Creatinine Clearance: 13.1 mL/min (A) (by C-G formula based on SCr of 4.08 mg/dL (H)). Liver Function Tests: Recent Labs  Lab 11/02/17 1536  11/05/17 0658 11/06/17 0547 11/07/17 0647 11/08/17 0544 11/09/17 0459  AST 37  --   --   --   --   --   --   ALT 27  --   --   --   --   --   --   ALKPHOS 108  --   --   --   --   --   --   BILITOT 0.6  --   --   --   --   --   --   PROT 7.4  --   --   --   --   --   --   ALBUMIN 2.3*   < > 2.1* 2.0* 2.2* 2.1* 2.0*   < > = values in this interval not displayed.   Recent Labs  Lab 11/02/17 1536  LIPASE 20   No results for input(s): AMMONIA in the last 168 hours. Coagulation Profile: Recent Labs  Lab 11/05/17 0540 11/06/17 0547 11/07/17 0647 11/08/17 0544 11/09/17 0459  INR >10.00* 3.96 1.68 1.45 1.42   Cardiac Enzymes: Recent Labs  Lab 11/02/17 1536 11/02/17 2011 11/03/17 0208 11/03/17 0801 11/03/17 1432  TROPONINI 0.09* 0.08* 0.09* 0.09* 0.08*   BNP (last 3 results) No results for input(s): PROBNP in the last 8760 hours. HbA1C: No results for input(s): HGBA1C in the last 72 hours. CBG: Recent Labs  Lab 11/08/17 1150 11/08/17 1622 11/08/17 2129 11/09/17 0740 11/09/17 1137  GLUCAP 138* 172* 285* 409* 313*   Lipid Profile: No results for input(s): CHOL, HDL, LDLCALC, TRIG, CHOLHDL, LDLDIRECT in the last 72 hours. Thyroid Function Tests: No results for input(s): TSH, T4TOTAL, FREET4, T3FREE, THYROIDAB in the last 72 hours. Anemia Panel: No results for input(s):  VITAMINB12, FOLATE,  FERRITIN, TIBC, IRON, RETICCTPCT in the last 72 hours. Urine analysis:    Component Value Date/Time   COLORURINE YELLOW 11/02/2017 1527   APPEARANCEUR CLOUDY (A) 11/02/2017 1527   LABSPEC 1.010 11/02/2017 1527   PHURINE 8.0 11/02/2017 1527   GLUCOSEU NEGATIVE 11/02/2017 1527   HGBUR NEGATIVE 11/02/2017 1527   BILIRUBINUR NEGATIVE 11/02/2017 1527   KETONESUR NEGATIVE 11/02/2017 1527   PROTEINUR 100 (A) 11/02/2017 1527   UROBILINOGEN 1.0 03/17/2014 1830   NITRITE NEGATIVE 11/02/2017 1527   LEUKOCYTESUR LARGE (A) 11/02/2017 1527   Sepsis Labs: '@LABRCNTIP' (procalcitonin:4,lacticidven:4)  ) Recent Results (from the past 240 hour(s))  Culture, blood (Routine X 2) w Reflex to ID Panel     Status: None   Collection Time: 11/02/17  8:11 PM  Result Value Ref Range Status   Specimen Description BLOOD RIGHT FOREARM  Final   Special Requests   Final    BOTTLES DRAWN AEROBIC ONLY Blood Culture adequate volume   Culture   Final    NO GROWTH 5 DAYS Performed at [facility], [address] [place], [place] [address]    Report Status 11/07/2017 FINAL  Final  Culture, blood (Routine X 2) w Reflex to ID Panel     Status: None   Collection Time: 11/02/17  8:11 PM  Result Value Ref Range Status   Specimen Description BLOOD RIGHT WRIST  Final   Special Requests   Final    BOTTLES DRAWN AEROBIC ONLY Blood Culture adequate volume   Culture   Final    NO GROWTH 5 DAYS Performed at [facility], [address], [place], [place] [address]    Report Status 11/07/2017 FINAL  Final  Respiratory Panel by PCR     Status: Abnormal   Collection Time: 11/02/17 10:13 PM  Result Value Ref Range Status   Adenovirus NOT DETECTED NOT DETECTED Final   Coronavirus 229E NOT DETECTED NOT DETECTED Final   Coronavirus HKU1 NOT DETECTED NOT DETECTED Final   Coronavirus NL63 NOT DETECTED NOT DETECTED Final   Coronavirus OC43 NOT DETECTED NOT DETECTED Final   Metapneumovirus NOT DETECTED NOT  DETECTED Final   Rhinovirus / Enterovirus NOT DETECTED NOT DETECTED Final   Influenza A NOT DETECTED NOT DETECTED Final   Influenza B NOT DETECTED NOT DETECTED Final   Parainfluenza Virus 1 NOT DETECTED NOT DETECTED Final   Parainfluenza Virus 2 NOT DETECTED NOT DETECTED Final   Parainfluenza Virus 3 NOT DETECTED NOT DETECTED Final   Parainfluenza Virus 4 NOT DETECTED NOT DETECTED Final   Respiratory Syncytial Virus DETECTED (A) NOT DETECTED Final    Comment: CRITICAL RESULT CALLED TO, READ BACK BY AND VERIFIED WITH: RN A HOBBS [id number] MLM    Bordetella pertussis NOT DETECTED NOT DETECTED Final   Chlamydophila pneumoniae NOT DETECTED NOT DETECTED Final   Mycoplasma pneumoniae NOT DETECTED NOT DETECTED Final    Comment: Performed at [facility], [place] [address], [place], [place] [address]  MRSA PCR Screening     Status: None   Collection Time: 11/03/17 10:00 AM  Result Value Ref Range Status   MRSA by PCR NEGATIVE NEGATIVE Final    Comment:        The GeneXpert MRSA Assay (FDA approved for NASAL specimens only), is one component of a comprehensive MRSA colonization surveillance program. It is not intended to diagnose MRSA infection nor to guide or monitor treatment for MRSA infections. Performed at [facility], [address], [place], [place] [address]  Radiology Studies: Ir Fluoro Guide Cv Line Right  Result Date: 11/09/2017 INDICATION: Acute on chronic renal failure. Please place tunneled dialysis catheter for the initiation of dialysis. EXAM: TUNNELED CENTRAL VENOUS HEMODIALYSIS CATHETER PLACEMENT WITH ULTRASOUND AND FLUOROSCOPIC GUIDANCE MEDICATIONS: Ancef 2 gm IV . The antibiotic was given in an appropriate time interval prior to skin puncture. ANESTHESIA/SEDATION: Versed 1.5 mg IV; Fentanyl 50 mcg IV; Moderate Sedation Time: 13 minutes The patient was continuously monitored during the procedure by the interventional radiology nurse under my direct  supervision. FLUOROSCOPY TIME:  Fluoroscopy Time: 30 seconds (3 mGy). COMPLICATIONS: None immediate. PROCEDURE: Informed written consent was obtained from the patient after a discussion of the risks, benefits, and alternatives to treatment. Questions regarding the procedure were encouraged and answered. The right neck and chest were prepped with chlorhexidine in a sterile fashion, and a sterile drape was applied covering the operative field. Maximum barrier sterile technique with sterile gowns and gloves were used for the procedure. A timeout was performed prior to the initiation of the procedure. After creating a small venotomy incision, a micropuncture kit was utilized to access the internal jugular vein. Real-time ultrasound guidance was utilized for vascular access including the acquisition of a permanent ultrasound image documenting patency of the accessed vessel. The microwire was utilized to measure appropriate catheter length. A stiff Glidewire was advanced to the level of the IVC and the micropuncture sheath was exchanged for a peel-away sheath. A palindrome tunneled hemodialysis catheter measuring 19 cm from tip to cuff was tunneled in a retrograde fashion from the anterior chest wall to the venotomy incision. The catheter was then placed through the peel-away sheath with tips ultimately positioned within the superior aspect of the right atrium. Final catheter positioning was confirmed and documented with a spot radiographic image. The catheter aspirates and flushes normally. The catheter was flushed with appropriate volume heparin dwells. The catheter exit site was secured with a 0-Prolene retention suture. The venotomy incision was closed with an interrupted 4-0 Vicryl, Dermabond and Steri-strips. Dressings were applied. The patient tolerated the procedure well without immediate post procedural complication. IMPRESSION: Successful placement of 19 cm tip to cuff tunneled hemodialysis catheter via the  right internal jugular vein with tips terminating within the superior aspect of the right atrium. The catheter is ready for immediate use. Electronically Signed   By: Sandi Mariscal M.D.   On: 11/09/2017 14:01   Ir US Guide Vasc Access Right  Result Date: 11/09/2017 INDICATION: Acute on chronic renal failure. Please place tunneled dialysis catheter for the initiation of dialysis. EXAM: TUNNELED CENTRAL VENOUS HEMODIALYSIS CATHETER PLACEMENT WITH ULTRASOUND AND FLUOROSCOPIC GUIDANCE MEDICATIONS: Ancef 2 gm IV . The antibiotic was given in an appropriate time interval prior to skin puncture. ANESTHESIA/SEDATION: Versed 1.5 mg IV; Fentanyl 50 mcg IV; Moderate Sedation Time: 13 minutes The patient was continuously monitored during the procedure by the interventional radiology nurse under my direct supervision. FLUOROSCOPY TIME:  Fluoroscopy Time: 30 seconds (3 mGy). COMPLICATIONS: None immediate. PROCEDURE: Informed written consent was obtained from the patient after a discussion of the risks, benefits, and alternatives to treatment. Questions regarding the procedure were encouraged and answered. The right neck and chest were prepped with chlorhexidine in a sterile fashion, and a sterile drape was applied covering the operative field. Maximum barrier sterile technique with sterile gowns and gloves were used for the procedure. A timeout was performed prior to the initiation of the procedure. After creating a small venotomy incision, a micropuncture kit was  utilized to access the internal jugular vein. Real-time ultrasound guidance was utilized for vascular access including the acquisition of a permanent ultrasound image documenting patency of the accessed vessel. The microwire was utilized to measure appropriate catheter length. A stiff Glidewire was advanced to the level of the IVC and the micropuncture sheath was exchanged for a peel-away sheath. A palindrome tunneled hemodialysis catheter measuring 19 cm from tip to  cuff was tunneled in a retrograde fashion from the anterior chest wall to the venotomy incision. The catheter was then placed through the peel-away sheath with tips ultimately positioned within the superior aspect of the right atrium. Final catheter positioning was confirmed and documented with a spot radiographic image. The catheter aspirates and flushes normally. The catheter was flushed with appropriate volume heparin dwells. The catheter exit site was secured with a 0-Prolene retention suture. The venotomy incision was closed with an interrupted 4-0 Vicryl, Dermabond and Steri-strips. Dressings were applied. The patient tolerated the procedure well without immediate post procedural complication. IMPRESSION: Successful placement of 19 cm tip to cuff tunneled hemodialysis catheter via the right internal jugular vein with tips terminating within the superior aspect of the right atrium. The catheter is ready for immediate use. Electronically Signed   By: Sandi Mariscal M.D.   On: 11/09/2017 14:01        Scheduled Meds: . atorvastatin  40 mg Oral QHS  . brimonidine  1 drop Left Eye Daily   And  . timolol  1 drop Left Eye Daily  . calcitRIOL  0.25 mcg Oral Once per day on Mon Wed Fri  . calcium acetate  1,334 mg Oral TID WC  . febuxostat  40 mg Oral Daily  . feeding supplement  1 Container Oral TID BM  . folic acid  1 mg Oral Daily  . furosemide  80 mg Oral Daily  . hydroxychloroquine  300 mg Oral Daily  . insulin aspart  0-20 Units Subcutaneous TID WC  . insulin detemir  10 Units Subcutaneous QHS  . ipratropium-albuterol  3 mL Nebulization TID  . ketorolac  1 drop Left Eye Daily  . latanoprost  1 drop Left Eye QHS  . mouth rinse  15 mL Mouth Rinse BID  . methylPREDNISolone (SOLU-MEDROL) injection  60 mg Intravenous Daily  . metoprolol tartrate  2.5 mg Intravenous Q6H  . pantoprazole  40 mg Oral Daily  . sodium bicarbonate  650 mg Oral TID  . Warfarin - Pharmacist Dosing Inpatient   Does not  apply Q24H   Continuous Infusions: . sodium chloride    . sodium chloride    . sodium chloride    . sodium chloride    .  ceFAZolin (ANCEF) IV    . piperacillin-tazobactam (ZOSYN)  IV Stopped (11/09/17 1314)     LOS: 7 days    Time spent: 25 minutes.    Lelon Frohlich, MD [facility] Pager [phone]  If 7PM-7AM, please contact night-coverage [email or web address] Password [facility] 11/09/2017, 2:04 PM

## 2017-11-10 ENCOUNTER — Ambulatory Visit: Payer: Medicare Other | Admitting: Orthopaedic Surgery

## 2017-11-10 ENCOUNTER — Ambulatory Visit (HOSPITAL_COMMUNITY): Payer: Medicare Other | Admitting: Physical Therapy

## 2017-11-10 LAB — GLUCOSE, CAPILLARY
GLUCOSE-CAPILLARY: 137 mg/dL — AB (ref 65–99)
GLUCOSE-CAPILLARY: 187 mg/dL — AB (ref 65–99)
GLUCOSE-CAPILLARY: 190 mg/dL — AB (ref 65–99)
GLUCOSE-CAPILLARY: 356 mg/dL — AB (ref 65–99)
Glucose-Capillary: 225 mg/dL — ABNORMAL HIGH (ref 65–99)

## 2017-11-10 LAB — PROTIME-INR
INR: 1.32
Prothrombin Time: 16.2 seconds — ABNORMAL HIGH (ref 11.4–15.2)

## 2017-11-10 LAB — RENAL FUNCTION PANEL
ALBUMIN: 2.3 g/dL — AB (ref 3.5–5.0)
Anion gap: 11 (ref 5–15)
BUN: 97 mg/dL — AB (ref 6–20)
CALCIUM: 8.5 mg/dL — AB (ref 8.9–10.3)
CO2: 30 mmol/L (ref 22–32)
CREATININE: 2.87 mg/dL — AB (ref 0.44–1.00)
Chloride: 100 mmol/L — ABNORMAL LOW (ref 101–111)
GFR, EST AFRICAN AMERICAN: 18 mL/min — AB (ref >60)
GFR, EST NON AFRICAN AMERICAN: 15 mL/min — AB (ref >60)
Glucose, Bld: 185 mg/dL — ABNORMAL HIGH (ref 65–99)
PHOSPHORUS: 4.4 mg/dL (ref 2.5–4.6)
Potassium: 3.9 mmol/L (ref 3.5–5.1)
Sodium: 141 mmol/L (ref 135–145)

## 2017-11-10 LAB — CBC
HCT: 38.3 % (ref 36.0–46.0)
Hemoglobin: 11.7 g/dL — ABNORMAL LOW (ref 12.0–15.0)
MCH: 26.8 pg (ref 26.0–34.0)
MCHC: 30.5 g/dL (ref 30.0–36.0)
MCV: 87.8 fL (ref 78.0–100.0)
PLATELETS: 451 10*3/uL — AB (ref 150–400)
RBC: 4.36 MIL/uL (ref 3.87–5.11)
RDW: 18.5 % — AB (ref 11.5–15.5)
WBC: 18.4 10*3/uL — ABNORMAL HIGH (ref 4.0–10.5)

## 2017-11-10 MED ORDER — SODIUM CHLORIDE 0.9 % IV SOLN: 100.0000 mL | INTRAVENOUS | Status: DC | PRN

## 2017-11-10 MED ORDER — WARFARIN SODIUM 7.5 MG PO TABS
7.5000 mg | ORAL_TABLET | Freq: Once | ORAL | Status: AC
  Administered 2017-11-10: 7.5 mg via ORAL
  Filled 2017-11-10: qty 1

## 2017-11-10 NOTE — Clinical Social Work Note (Signed)
LCSW contacted [facility] to inquire about not receiving faxed checklist and request for clinicals. LCSW was advised that they had no record of LCSW's call and did not have patient's information in their system. LCSW again made the referral for new start dialysis at [facility]. LCSW then received cover sheet and faxed patient's clinicals to [facility].      Tenesia Escudero, Clydene Pugh, LCSW

## 2017-11-10 NOTE — Progress Notes (Signed)
Physical Therapy Treatment Patient Details Name: [name] MRN: [id number] DOB: 21-Jan-1946 Today's Date: 11/10/2017    History of Present Illness [name] is a 72 y.o. female PMH: CKD-IV c LUE fistula , CHF, hypo-Fe anemia, AF on warfarin, COPD, HTN, DM2, and SLE, presents to ED from PCP d/t 2-3d fever, ABD pain, emesis, nausea. [name]  Pt denies blood in emesis or stools and states ABD pain is chronic x6 months.  Prior ABD CT unremarkable, with gastroc FU on 2/14.  H/o chronic RLE wounds managed at home. On 2/11 tunneled catheter placed at [facility].      PT Comments    Patient is a 72 year old female that seemed more cognitively aware today although verbal cues were needed a few times to ensure she wasn't falling asleep while seated at edge of bed performing seated exercises. Patient was able to transfer sit to/from stand and stand pivot transfer from bed to/from [facility] today. Patient needs reminders to sit and stand up straight as she is quite kyphotic. Patient's upright activity was limited by fatigue and abdominal pain. Patient requested pain meds and RN was notified. Overall, patient performed better than yesterday but continues to show deficits in strength, endurance, balance, and functional mobility skills and therefore will continue to benefit from skilled physical therapy in this and next venue.    Follow Up Recommendations  SNF     Equipment Recommendations  None recommended by PT    Recommendations for Other Services       Precautions / Restrictions Precautions Precautions: Fall Restrictions Weight Bearing Restrictions: No    Mobility  Bed Mobility Overal bed mobility: Needs Assistance Bed Mobility: Supine to Sit     Supine to sit: Min guard;Min assist        Transfers Overall transfer level: Needs assistance Equipment used: Rolling walker (2 wheeled) Transfers: Sit to/from [facility] Sit to  Stand: Min assist;Min guard Stand pivot transfers: Min assist;Min guard       General transfer comment: slow, unsteady movement  Ambulation/Gait Ambulation/Gait assistance: Min assist;Min guard Ambulation Distance (Feet): 5 Feet Assistive device: Rolling walker (2 wheeled) Gait Pattern/deviations: Decreased step length - right;Decreased step length - left;Decreased stride length;Trunk flexed;Shuffle   Gait velocity interpretation: Below normal speed for age/gender General Gait Details: limited to 3-4 steps due to fatigue and generlized weakness   Stairs            Wheelchair Mobility    Modified Rankin (Stroke Patients Only)       Balance Overall balance assessment: Needs assistance Sitting-balance support: No upper extremity supported Sitting balance-Leahy Scale: Fair     Standing balance support: Bilateral upper extremity supported;During functional activity Standing balance-Leahy Scale: Fair Standing balance comment: fair with RW                            Cognition Arousal/Alertness: Awake/alert Behavior During Therapy: WFL for tasks assessed/performed Overall Cognitive Status: Difficult to assess                                 General Comments: requires moderate verbal and tactile cues to attend to task of seated exercises.       Exercises General Exercises - Upper Extremity Shoulder Flexion: AROM;Seated;Strengthening;Both;10 reps General Exercises - Lower Extremity Ankle Circles/Pumps: AROM;Seated;Strengthening;Both;10 reps Long Arc  Quad: AROM;Seated;Strengthening;Both;10 reps Hip Flexion/Marching: AROM;Seated;Strengthening;Both;10 reps    General Comments        Pertinent Vitals/Pain Pain Assessment: Faces Faces Pain Scale: Hurts whole lot(abdominal pain) Pain Descriptors / Indicators: Aching Pain Intervention(s): Limited activity within patient's tolerance;Patient requesting pain meds-RN notified    Home Living                       Prior Function            PT Goals (current goals can now be found in the care plan section)      Frequency    Min 3X/week      PT Plan      Co-evaluation              AM-PAC PT "6 Clicks" Daily Activity  Outcome Measure  Difficulty turning over in bed (including adjusting bedclothes, sheets and blankets)?: A Lot Difficulty moving from lying on back to sitting on the side of the bed? : A Lot Difficulty sitting down on and standing up from a chair with arms (e.g., wheelchair, bedside commode, etc,.)?: Unable Help needed moving to and from a bed to chair (including a wheelchair)?: Total Help needed walking in hospital room?: Total Help needed climbing 3-5 steps with a railing? : Total 6 Click Score: 8    End of Session   Activity Tolerance: Patient limited by fatigue;Patient limited by pain Patient left: in bed;with call bell/phone within reach;with bed alarm set;with family/visitor present Nurse Communication: Mobility status PT Visit Diagnosis: Unsteadiness on feet (R26.81);Other abnormalities of gait and mobility (R26.89);Muscle weakness (generalized) (M62.81)     Time: 7121-9758 PT Time Calculation (min) (ACUTE ONLY): 29 min  Charges:  $Gait Training: 8-22 mins $Therapeutic Exercise: 8-22 mins                    G Codes:       Azucena Dart D. Hartnett-Rands, MS, PT Per [place] [phone] 11/10/2017, 11:48 AM

## 2017-11-10 NOTE — Progress Notes (Signed)
ANTICOAGULATION CONSULT NOTE - follow up  Pharmacy Consult for Warfarin (home med) Indication: atrial fibrillation  Allergies  Allergen Reactions  . Ace Inhibitors Cough   Patient Measurements: Height: 5\' 4"  (162.6 cm) Weight: 186 lb 11.7 oz (84.7 kg) IBW/kg (Calculated) : 54.7  Vital Signs: Temp: 98.4 F (36.9 C) (02/12 0534) Temp Source: Oral (02/12 0534) BP: 122/69 (02/12 0534) Pulse Rate: 91 (02/12 0534)  Labs: Recent Labs    11/08/17 0544 11/09/17 0459 11/10/17 0507  HGB 11.3* 11.1* 11.7*  HCT 35.6* 35.6* 38.3  PLT 477* 477* 451*  LABPROT 17.5* 17.3* 16.2*  INR 1.45 1.42 1.32  CREATININE 4.30* 4.08* 2.87*   Estimated Creatinine Clearance: 18.9 mL/min (A) (by C-G formula based on SCr of 2.87 mg/dL (H)).  Medical History: Past Medical History:  Diagnosis Date  . Anemia   . Arthritis   . Asthma   . Atrial fibrillation ([place])   . Atrial flutter [facility])    TEE/DCCV December 2013 - on Xarelto [facility])  . Bone spur of toe    Right 5th toe  . Chronic diastolic CHF (congestive heart failure) (HCC)    a. EF 50-55% by echo in 2015  . CKD (chronic kidney disease) stage 4, GFR 15-29 ml/min (HCC)    a. s/p fistula placement but not on HD. Followed by Nephrology.   Marland Kitchen COPD (chronic obstructive pulmonary disease) ([place])   . DVT of upper extremity (deep vein thrombosis) ([place])    Right basilic vein, June 6283  . Essential hypertension, benign   . Fractures   . SLE (systemic lupus erythematosus) ([place])   . Type 2 diabetes mellitus ([place])   . UTI (lower urinary tract infection)    Medications:  Medications Prior to Admission  Medication Sig Dispense Refill Last Dose  . acetaminophen (TYLENOL) 500 MG tablet Take 1,000 mg by mouth every 6 (six) hours as needed. For pain   Taking  . albuterol (ACCUNEB) 0.63 MG/3ML nebulizer solution Take 1 ampule by nebulization every 6 (six) hours as needed for wheezing or shortness of breath.    11/01/2017 at Unknown time  . albuterol (PROVENTIL  HFA;VENTOLIN HFA) 108 (90 BASE) MCG/ACT inhaler Inhale 2 puffs into the lungs every 6 (six) hours as needed. For shortness of breath   11/01/2017 at Unknown time  . amLODipine (NORVASC) 10 MG tablet Take 1 tablet (10 mg total) by mouth at bedtime. 30 tablet 0 11/02/2017 at Unknown time  . atorvastatin (LIPITOR) 40 MG tablet Take 40 mg at bedtime by mouth.    11/01/2017 at Unknown time  . brimonidine-timolol (COMBIGAN) 0.2-0.5 % ophthalmic solution Place 1 drop into the left eye daily.    11/01/2017 at Unknown time  . calcitRIOL (ROCALTROL) 0.25 MCG capsule Take 0.25 mcg by mouth as directed. Take on Mondays Wednesdays and Fridays   11/02/2017 at Unknown time  . diclofenac (VOLTAREN) 0.1 % ophthalmic solution Place 1 drop into the left eye daily. Uses at lunch time   11/01/2017 at Unknown time  . dicyclomine (BENTYL) 10 MG capsule Take 10 mg by mouth 4 (four) times daily -  before meals and at bedtime.   11/02/2017 at Unknown time  . febuxostat (ULORIC) 40 MG tablet Take 40 mg by mouth daily.   11/02/2017 at Unknown time  . folic acid (FOLVITE) 1 MG tablet Take 1 mg by mouth daily.   11/02/2017 at Unknown time  . furosemide (LASIX) 80 MG tablet Take 160 mg by mouth 2 (two) times daily.  11/02/2017 at Unknown time  . HUMALOG KWIKPEN 100 UNIT/ML KiwkPen INJECT UP TO 11 UNITS SUBCUTANEOUSLY BEFORE MEALS THREE TIMES DAILY. 15 mL 2 11/02/2017 at Unknown time  . HYDROcodone-acetaminophen (NORCO/VICODIN) 5-325 MG tablet 1 or 2 tabs PO q12 hours prn pain 10 tablet 0 11/01/2017 at Unknown time  . hydroxychloroquine (PLAQUENIL) 200 MG tablet Take 200 mg by mouth as directed. Take 1 1/2 tablets daily   11/02/2017 at 900  . Insulin Detemir (LEVEMIR FLEXTOUCH) 100 UNIT/ML Pen Inject 10 Units into the skin daily at 10 pm. 5 pen 2 11/01/2017 at Unknown time  . metoprolol tartrate (LOPRESSOR) 50 MG tablet TAKE ONE TABLET BY MOUTH TWICE DAILY. 60 tablet 6 11/02/2017 at 900  . Multiple Vitamins-Minerals (CENTRUM SILVER PO) Take 1 tablet by mouth  daily.   11/02/2017 at Unknown time  . ONE TOUCH ULTRA TEST test strip TEST 4 TIMES A DAY AS DIRECTED. 150 each 5 11/02/2017 at Unknown time  . ONETOUCH DELICA LANCETS 07K MISC USE TO TEST 4 TIMES DAILY. 100 each 5 11/02/2017 at Unknown time  . predniSONE (DELTASONE) 5 MG tablet Take 5 mg by mouth daily with breakfast.   11/02/2017 at Unknown time  . travoprost, benzalkonium, (TRAVATAN) 0.004 % ophthalmic solution Place 1 drop into the left eye at bedtime.   11/01/2017 at Unknown time  . UNIFINE PENTIPS 31G X 6 MM MISC USE AS DIRECTED AT BEDTIME WITH LEVEMIR AND WITH SLIDING SCALE INSULIN. 150 each 5 11/02/2017 at Unknown time  . Vitamin D, Ergocalciferol, (DRISDOL) 50000 units CAPS capsule TAKE 1 CAPSULE BY MOUTH ONCE WEEKLY. 12 capsule 0 Past Week at Unknown time  . warfarin (COUMADIN) 5 MG tablet Take 1 tablet daily (Patient taking differently: Take 5 mg by mouth daily at 6 PM. Take 1 tablet daily) 180 tablet 3 11/01/2017 at 1800    Home dose = 5mg  daily per PTA med list.  Assessment: 72 yo woman admitted due to renal failure, acute on chronic. INR was supratherapeutic > 10 with no e/o bleeding. Vitamin K 5mg  po given x 1 on 2/7.  Coumadin on hold for placement of tunnel catheter at [facility] on 11/09/17 which was completed. INR trending down.    Goal of Therapy:  INR 2-3   Plan:  Coumadin 7.5mg  x 1 today (to boost INR) Daily PT/INR Monitor for s/s of bleeding   11/10/2017 10:41 AM

## 2017-11-10 NOTE — Progress Notes (Signed)
Inpatient Diabetes Program Recommendations  AACE/ADA: New Consensus Statement on Inpatient Glycemic Control (2015)  Target Ranges:  Prepandial:   less than 140 mg/dL      Peak postprandial:   less than 180 mg/dL (1-2 hours)      Critically ill patients:  140 - 180 mg/dL   Lab Results  Component Value Date   GLUCAP 187 (H) 11/10/2017   HGBA1C 5.6 10/20/2017    Review of Glycemic Control  Results for [name], [name] (MRN [id number]) as of 11/10/2017 10:30  Ref. Range 11/09/2017 16:59 11/09/2017 20:16 11/09/2017 21:57 11/10/2017 00:47 11/10/2017 08:18  Glucose-Capillary Latest Ref Range: 65 - 99 mg/dL 145 (H) 66 71 137 (H) 187 (H)    Diabetes history: Type 2 DM Outpatient Diabetes medications: Humalog 11 units tid with meals, Levemir 10 units q HS  Current orders for Inpatient glycemic control: Novolog resistant tid with meals, Levemir 10 units q HS, Solumedrol 60 mg IV daily and tapering downward over the next 5 days  Inpatient Diabetes Program Recommendations:  Please consider adding Novolog 3 units tid with meals.  Please reduce Novolog correction to sensitive 0-9 units tid with meals and Novolog 0-5 units qhs because of ESRD.  Continue Levemir 10 units qhs.   Gentry Fitz, RN, BA, MHA, CDE Diabetes Coordinator Inpatient Diabetes Program  [phone] (Team Pager) [phone] ([place]) 11/10/2017 10:35 AM

## 2017-11-10 NOTE — Care Management Note (Signed)
Case Management Note  Patient Details  Name: [name] MRN: [id number] Date of Birth: 13-Jan-1946  If discussed at Long Length of Stay Meetings, dates discussed:  11/10/17  Additional Comments:  Sherald Barge, RN 11/10/2017, 12:04 PM

## 2017-11-10 NOTE — Progress Notes (Signed)
Subjective: Interval History: Patient claims to have feeling somewhat better.  She did not have any nausea or vomiting.  Her appetite is okay and no difficulty breathing.  Objective: Vital signs in last 24 hours: Temp:  [98.1 F (36.7 C)-98.4 F (36.9 C)] 98.4 F (36.9 C) (02/12 0534) Pulse Rate:  [78-94] 91 (02/12 0534) Resp:  [15-20] 17 (02/12 0534) BP: (122-198)/(52-85) 122/69 (02/12 0534) SpO2:  [97 %-100 %] 98 % (02/12 0534) Weight:  [83.8 kg (184 lb 11.9 oz)-85.5 kg (188 lb 7.9 oz)] 84.7 kg (186 lb 11.7 oz) (02/12 0534) Weight change: 1.8 kg (3 lb 15.5 oz)  Intake/Output from previous day: 02/11 0701 - 02/12 0700 In: 170 [P.O.:120; IV Piggyback:50] Out: 352 [Urine:352] Intake/Output this shift: No intake/output data recorded.  General appearance: alert, cooperative and no distress Resp: diminished breath sounds bilaterally Cardio: regular rate and rhythm Extremities: No edema  Lab Results: Recent Labs    11/09/17 0459 11/10/17 0507  WBC 15.9* 18.4*  HGB 11.1* 11.7*  HCT 35.6* 38.3  PLT 477* 451*   BMET:  Recent Labs    11/09/17 0459 11/10/17 0507  NA 138 141  K 4.0 3.9  CL 100* 100*  CO2 24 30  GLUCOSE 442* 185*  BUN 152* 97*  CREATININE 4.08* 2.87*  CALCIUM 8.7* 8.5*   No results for input(s): PTH in the last 72 hours. Iron Studies:  No results for input(s): IRON, TIBC, TRANSFERRIN, FERRITIN in the last 72 hours.  Studies/Results: Ir Fluoro Guide Cv Line Right  Result Date: 11/09/2017 INDICATION: Acute on chronic renal failure. Please place tunneled dialysis catheter for the initiation of dialysis. EXAM: TUNNELED CENTRAL VENOUS HEMODIALYSIS CATHETER PLACEMENT WITH ULTRASOUND AND FLUOROSCOPIC GUIDANCE MEDICATIONS: Ancef 2 gm IV . The antibiotic was given in an appropriate time interval prior to skin puncture. ANESTHESIA/SEDATION: Versed 1.5 mg IV; Fentanyl 50 mcg IV; Moderate Sedation Time: 13 minutes The patient was continuously monitored during the  procedure by the interventional radiology nurse under my direct supervision. FLUOROSCOPY TIME:  Fluoroscopy Time: 30 seconds (3 mGy). COMPLICATIONS: None immediate. PROCEDURE: Informed written consent was obtained from the patient after a discussion of the risks, benefits, and alternatives to treatment. Questions regarding the procedure were encouraged and answered. The right neck and chest were prepped with chlorhexidine in a sterile fashion, and a sterile drape was applied covering the operative field. Maximum barrier sterile technique with sterile gowns and gloves were used for the procedure. A timeout was performed prior to the initiation of the procedure. After creating a small venotomy incision, a micropuncture kit was utilized to access the internal jugular vein. Real-time ultrasound guidance was utilized for vascular access including the acquisition of a permanent ultrasound image documenting patency of the accessed vessel. The microwire was utilized to measure appropriate catheter length. A stiff Glidewire was advanced to the level of the IVC and the micropuncture sheath was exchanged for a peel-away sheath. A palindrome tunneled hemodialysis catheter measuring 19 cm from tip to cuff was tunneled in a retrograde fashion from the anterior chest wall to the venotomy incision. The catheter was then placed through the peel-away sheath with tips ultimately positioned within the superior aspect of the right atrium. Final catheter positioning was confirmed and documented with a spot radiographic image. The catheter aspirates and flushes normally. The catheter was flushed with appropriate volume heparin dwells. The catheter exit site was secured with a 0-Prolene retention suture. The venotomy incision was closed with an interrupted 4-0 Vicryl, Dermabond and  Steri-strips. Dressings were applied. The patient tolerated the procedure well without immediate post procedural complication. IMPRESSION: Successful placement  of 19 cm tip to cuff tunneled hemodialysis catheter via the right internal jugular vein with tips terminating within the superior aspect of the right atrium. The catheter is ready for immediate use. Electronically Signed   By: Sandi Mariscal M.D.   On: 11/09/2017 14:01   Ir US Guide Vasc Access Right  Result Date: 11/09/2017 INDICATION: Acute on chronic renal failure. Please place tunneled dialysis catheter for the initiation of dialysis. EXAM: TUNNELED CENTRAL VENOUS HEMODIALYSIS CATHETER PLACEMENT WITH ULTRASOUND AND FLUOROSCOPIC GUIDANCE MEDICATIONS: Ancef 2 gm IV . The antibiotic was given in an appropriate time interval prior to skin puncture. ANESTHESIA/SEDATION: Versed 1.5 mg IV; Fentanyl 50 mcg IV; Moderate Sedation Time: 13 minutes The patient was continuously monitored during the procedure by the interventional radiology nurse under my direct supervision. FLUOROSCOPY TIME:  Fluoroscopy Time: 30 seconds (3 mGy). COMPLICATIONS: None immediate. PROCEDURE: Informed written consent was obtained from the patient after a discussion of the risks, benefits, and alternatives to treatment. Questions regarding the procedure were encouraged and answered. The right neck and chest were prepped with chlorhexidine in a sterile fashion, and a sterile drape was applied covering the operative field. Maximum barrier sterile technique with sterile gowns and gloves were used for the procedure. A timeout was performed prior to the initiation of the procedure. After creating a small venotomy incision, a micropuncture kit was utilized to access the internal jugular vein. Real-time ultrasound guidance was utilized for vascular access including the acquisition of a permanent ultrasound image documenting patency of the accessed vessel. The microwire was utilized to measure appropriate catheter length. A stiff Glidewire was advanced to the level of the IVC and the micropuncture sheath was exchanged for a peel-away sheath. A palindrome  tunneled hemodialysis catheter measuring 19 cm from tip to cuff was tunneled in a retrograde fashion from the anterior chest wall to the venotomy incision. The catheter was then placed through the peel-away sheath with tips ultimately positioned within the superior aspect of the right atrium. Final catheter positioning was confirmed and documented with a spot radiographic image. The catheter aspirates and flushes normally. The catheter was flushed with appropriate volume heparin dwells. The catheter exit site was secured with a 0-Prolene retention suture. The venotomy incision was closed with an interrupted 4-0 Vicryl, Dermabond and Steri-strips. Dressings were applied. The patient tolerated the procedure well without immediate post procedural complication. IMPRESSION: Successful placement of 19 cm tip to cuff tunneled hemodialysis catheter via the right internal jugular vein with tips terminating within the superior aspect of the right atrium. The catheter is ready for immediate use. Electronically Signed   By: Sandi Mariscal M.D.   On: 11/09/2017 14:01    I have reviewed the patient's current medications.  Assessment/Plan: 1] chronic renal failure: Patient had 2 hours of dialysis yesterday.  Presently she is a symptomatic. 2] difficulty breathing: Possibly a combination of COPD and fluid overload.  She denies any difficulty breathing.  She seems to be feeling better. 3] hypertension: Her blood pressure is reasonably controlled 4] diabetes: Blood sugar is reasonably controlled 5] history of lupus 6] bone mineral disorder: Her calcium and phosphorus is range. 7] anemia: Her hemoglobin remains within our target goal. Plan: 1] will make arrangement for patient to get dialysis for 2-1/2 hours today 2] we will check a renal panel in the morning. 3] patient could be discharged once outpatient dialysis  is arranged at  [facility] outpatient dialysis unit.   LOS: 8 days   Sruti Ayllon S 11/10/2017,7:22  AM

## 2017-11-10 NOTE — Procedures (Signed)
    HEMODIALYSIS TREATMENT NOTE:  2.5 hour heparin-free dialysis completed via right IJ tunneled catheter. Exit site unremarkable. Goal met: 1 liter removed without interruption in ultrafiltration.  Hemodynamically stable throughout HD session.  All blood was returned.  Report given to Weyman Pedro, RN.  Rockwell Alexandria, RN, CDN

## 2017-11-10 NOTE — Clinical Social Work Placement (Signed)
   CLINICAL SOCIAL WORK PLACEMENT  NOTE  Date:  11/10/2017  Patient Details  Name: [name] MRN: [id number] Date of Birth: 07-25-1946  Clinical Social Work is seeking post-discharge placement for this patient at the [place] level of care (*CSW will initial, date and re-position this form in  chart as items are completed):  Yes   Patient/family provided with [place] Work Department's list of facilities offering this level of care within the geographic area requested by the patient (or if unable, by the patient's family).  Yes   Patient/family informed of their freedom to choose among providers that offer the needed level of care, that participate in Medicare, Medicaid or managed care program needed by the patient, have an available bed and are willing to accept the patient.  Yes   Patient/family informed of [facility]'s ownership interest in [facility] and [facility], as well as of the fact that they are under no obligation to receive care at these facilities.  PASRR submitted to EDS on 11/06/17     PASRR number received on 11/06/17     Existing PASRR number confirmed on       FL2 transmitted to all facilities in geographic area requested by pt/family on       FL2 transmitted to all facilities within larger geographic area on       Patient informed that his/her managed care company has contracts with or will negotiate with certain facilities, including the following:        Yes   Patient/family informed of bed offers received.  Patient chooses bed at [facility]     Physician recommends and patient chooses bed at      Patient to be transferred to [facility] on 11/10/17.  Patient to be transferred to facility by [facility] Staff      Patient family notified on   of transfer.  Name of family member notified:        PHYSICIAN       Additional Comment:     _______________________________________________ Ihor Gully, LCSW 11/10/2017, 12:22 PM

## 2017-11-10 NOTE — Progress Notes (Signed)
PROGRESS NOTE    [name]  OHY:[id number] DOB: August 06, 1946 DOA: 11/02/2017 PCP: Sharilyn Sites, MD     Brief Narrative:  72 year old woman admitted from home on 2/4 due to worsening of chronic abdominal pain with nausea and vomiting.  In addition she had been complaining of a nonproductive cough and low-grade fevers.  She was found to be hypoxemic with worsening renal failure.  On 2/7 INR was found to be greater than 10.  Was scheduled to start dialysis on 2/8, however hemodialysis nurse was unable to access left upper arm AV fistula.  Tunneled dialysis catheter placed on 2/11, had short dialysis on that day and then again on 2/12.  Per nephrology okay to discharge to SNF once outpatient HD slot has been arranged.  Assessment & Plan:   Principal Problem:   Renal failure (ARF), acute on chronic (HCC) Active Problems:   Atrial flutter (HCC)   SLE (systemic lupus erythematosus) (HCC)   Essential hypertension, benign   COPD (chronic obstructive pulmonary disease) (HCC)   Anemia of chronic renal failure, stage 4 (severe) (HCC)   Acute respiratory failure with hypoxia (HCC)   Chronic diastolic heart failure (HCC)   Focal segmental glomerulosclerosis without nephrosis or chronic glomerulonephritis   Mixed hyperlipidemia   Type 2 diabetes mellitus with stage 4 chronic kidney disease (HCC)   Iron deficiency anemia   Community acquired pneumonia   Nausea and vomiting   AKI (acute kidney injury) ([place])   Acute renal failure superimposed on stage 4 chronic kidney disease (HCC)   COPD with acute exacerbation (HCC)   Acute hypoxemic respiratory failure -Due to COPD exacerbation and presumed aspiration pneumonia/lobar pneumonia. -Her respiratory virus panel has also resulted positive for RSV. -Has completed 7 days of Zosyn.  No further antibiotic therapy planned for at this time. -She has been weaned off BiPAP. -Clinically improving, weaned completely off oxygen today.  COPD with  acute exacerbation -Continue nebs. -Continue steroid titration.  -She appears to be clinically improving.  Acute on chronic kidney disease stage IV -Progressing to end-stage renal disease this admission .-Appreciate nephrology input and recommendations. -Has been started on oral bicarb tablets given metabolic acidosis due to advanced renal failure. -Renal biopsy in 2015 with collapsing FSGS plus severe arterial sclerosis with moderate to severe tubulointerstitial scarring and superimposed acute tubular injury. -HD nurse was unable to access AV fistula, patient was sent to [facility] for a tunneled dialysis catheter that was placed on 2/11.  Had first dialysis session that day, followed by short dialysis session on 2/12.  Will need outpatient dialysis slot arranged prior.  Social work is aware.  UTI -UA with significant pyuria, unfortunately urine culture was not obtained on admission. -Has completed treatment with 7 days of zosyn.  Abdominal pain/intractable nausea and vomiting -Has resolved, suspect viral gastroenteritis.  Chronic diastolic heart failure -Clinically appears euvolemic and compensated. -Echo from 2/5 has been reviewed: EF 55-60% with no wall motion abnormalities and grade 2 diastolic dysfunction,, PA pressure of 63.  Paroxysmal atrial fibrillation -Currently in sinus rhythm, anticoagulated on warfarin, but warfarin had been on hold in anticipation of tunneled dialysis catheter placement on 2/11. -To resume Coumadin today, pharmacy aware. -Rate controlled on metoprolol. -INR 1.45 today. -Received vitamin K on 2/7 for an INR greater than 10.  Systemic lupus -Continue Plaquenil  Essential hypertension -Continues to have mainly isolated systolic hypertension, suspect due to edematous state from lack of hemodialysis and volume overload in addition to anxiety surrounding initiation of  dialysis this admission. -We will not add further medications at this time as the plan  is to initiate dialysis.  Type 2 diabetes -Recent hemoglobin A1c of 5.6 demonstrates excellent control. -CBGs improving. -Should continue to improve once steroids are titrated. -Improving with initiation of Levemir.   DVT prophylaxis: Warfarin Code Status: Full code Family Communication: Patient only Disposition Plan: SNF once medically ready. Will also need HD OP slot prior to DC.  Consultants:   Nephrology  Procedures:   Echo as above  Antimicrobials:  Anti-infectives (From admission, onward)   Start     Dose/Rate Route Frequency Ordered Stop   11/09/17 0900  ceFAZolin (ANCEF) IVPB 1 g/50 mL premix    Comments:  Give in IR for surgical prophylaxis   1 g 100 mL/hr over 30 Minutes Intravenous  Once 11/07/17 0957 11/09/17 1527   11/03/17 2200  cefTRIAXone (ROCEPHIN) 1 g in dextrose 5 % 50 mL IVPB  Status:  Discontinued     1 g 100 mL/hr over 30 Minutes Intravenous Every 24 hours 11/02/17 2234 11/03/17 0712   11/03/17 2200  piperacillin-tazobactam (ZOSYN) IVPB 3.375 g  Status:  Discontinued     3.375 g 12.5 mL/hr over 240 Minutes Intravenous Every 12 hours 11/03/17 1006 11/09/17 1405   11/03/17 1000  hydroxychloroquine (PLAQUENIL) tablet 300 mg    Comments:  Take 1 1/2 tablets daily     300 mg Oral Daily 11/02/17 2214     11/03/17 0900  piperacillin-tazobactam (ZOSYN) 2.25 g in dextrose 5 % 50 mL IVPB  Status:  Discontinued     2.25 g 100 mL/hr over 30 Minutes Intravenous Every 8 hours 11/03/17 0739 11/03/17 1005   11/03/17 0730  piperacillin-tazobactam (ZOSYN) IVPB 2.25 g  Status:  Discontinued     2.25 g 100 mL/hr over 30 Minutes Intravenous Every 6 hours 11/03/17 0712 11/03/17 0739   11/02/17 2300  azithromycin (ZITHROMAX) 500 mg in dextrose 5 % 250 mL IVPB  Status:  Discontinued     500 mg 250 mL/hr over 60 Minutes Intravenous Every 24 hours 11/02/17 2214 11/05/17 0940   11/02/17 2015  azithromycin (ZITHROMAX) 500 mg in dextrose 5 % 250 mL IVPB  Status:  Discontinued      500 mg 250 mL/hr over 60 Minutes Intravenous  Once 11/02/17 2004 11/02/17 2214   11/02/17 2015  cefTRIAXone (ROCEPHIN) 2 g in dextrose 5 % 50 mL IVPB     2 g 100 mL/hr over 30 Minutes Intravenous  Once 11/02/17 2004 11/02/17 2348       Subjective: Lying in bed, no distress, per RN has had a large loose stool today.    Objective: Vitals:   11/10/17 1451 11/10/17 1520 11/10/17 1530 11/10/17 1545  BP:  (!) 208/63 (!) 202/79 (!) 199/80  Pulse:  83 84 84  Resp:  16    Temp:  98.2 F (36.8 C)    TempSrc:  Oral    SpO2: 96% 97%    Weight:  84.9 kg (187 lb 2.7 oz)    Height:        Intake/Output Summary (Last 24 hours) at 11/10/2017 1559 Last data filed at 11/10/2017 1343 Gross per 24 hour  Intake 360 ml  Output 2 ml  Net 358 ml   Filed Weights   11/09/17 1900 11/10/17 0534 11/10/17 1520  Weight: 85.5 kg (188 lb 7.9 oz) 84.7 kg (186 lb 11.7 oz) 84.9 kg (187 lb 2.7 oz)    Examination:  General exam:  Alert, awake, oriented x 3 Respiratory system: Clear to auscultation. Respiratory effort normal. Cardiovascular system:RRR. No murmurs, rubs, gallops. Gastrointestinal system: Abdomen is nondistended, soft and nontender. No organomegaly or masses felt. Normal bowel sounds heard. Central nervous system: Alert and oriented. No focal neurological deficits. Extremities: No C/C/E, +pedal pulses Skin: No rashes, lesions or ulcers Psychiatry: Judgement and insight appear normal. Mood & affect appropriate.       Data Reviewed: I have personally reviewed following labs and imaging studies  CBC: Recent Labs  Lab 11/04/17 0442 11/06/17 1654 11/08/17 0544 11/09/17 0459 11/10/17 0507  WBC 10.8* 16.5* 18.4* 15.9* 18.4*  HGB 10.1* 10.1* 11.3* 11.1* 11.7*  HCT 32.1* 32.4* 35.6* 35.6* 38.3  MCV 87.7 86.2 85.6 86.4 87.8  PLT 272 368 477* 477* 628*   Basic Metabolic Panel: Recent Labs  Lab 11/06/17 0547 11/07/17 0647 11/08/17 0544 11/09/17 0459 11/10/17 0507  NA 134* 138  139 138 141  K 4.1 4.2 3.4* 4.0 3.9  CL 99* 101 101 100* 100*  CO2 20* 21* _0 GLUCOSE 155* 163* 187* 442* 185*  BUN 128* 132* 141* 152* 97*  CREATININE 4.56* 4.44* 4.30* 4.08* 2.87*  CALCIUM 8.6* 8.9 9.0 8.7* 8.5*  MG 2.4  --   --   --   --   PHOS 7.0* 6.2* 4.9* 5.5* 4.4   GFR: Estimated Creatinine Clearance: 19 mL/min (A) (by C-G formula based on SCr of 2.87 mg/dL (H)). Liver Function Tests: Recent Labs  Lab 11/06/17 0547 11/07/17 0647 11/08/17 0544 11/09/17 0459 11/10/17 0507  ALBUMIN 2.0* 2.2* 2.1* 2.0* 2.3*   No results for input(s): LIPASE, AMYLASE in the last 168 hours. No results for input(s): AMMONIA in the last 168 hours. Coagulation Profile: Recent Labs  Lab 11/06/17 0547 11/07/17 0647 11/08/17 0544 11/09/17 0459 11/10/17 0507  INR 3.96 1.68 1.45 1.42 1.32   Cardiac Enzymes: No results for input(s): CKTOTAL, CKMB, CKMBINDEX, TROPONINI in the last 168 hours. BNP (last 3 results) No results for input(s): PROBNP in the last 8760 hours. HbA1C: No results for input(s): HGBA1C in the last 72 hours. CBG: Recent Labs  Lab 11/09/17 2016 11/09/17 2157 11/10/17 0047 11/10/17 0818 11/10/17 1125  GLUCAP 66 71 137* 187* 225*   Lipid Profile: No results for input(s): CHOL, HDL, LDLCALC, TRIG, CHOLHDL, LDLDIRECT in the last 72 hours. Thyroid Function Tests: No results for input(s): TSH, T4TOTAL, FREET4, T3FREE, THYROIDAB in the last 72 hours. Anemia Panel: No results for input(s): VITAMINB12, FOLATE, FERRITIN, TIBC, IRON, RETICCTPCT in the last 72 hours. Urine analysis:    Component Value Date/Time   COLORURINE YELLOW 11/02/2017 1527   APPEARANCEUR CLOUDY (A) 11/02/2017 1527   LABSPEC 1.010 11/02/2017 1527   PHURINE 8.0 11/02/2017 1527   GLUCOSEU NEGATIVE 11/02/2017 1527   HGBUR NEGATIVE 11/02/2017 1527   BILIRUBINUR NEGATIVE 11/02/2017 1527   KETONESUR NEGATIVE 11/02/2017 1527   PROTEINUR 100 (A) 11/02/2017 1527   UROBILINOGEN 1.0 03/17/2014 1830    NITRITE NEGATIVE 11/02/2017 1527   LEUKOCYTESUR LARGE (A) 11/02/2017 1527   Sepsis Labs: _1 (procalcitonin:4,lacticidven:4)  ) Recent Results (from the past 240 hour(s))  Culture, blood (Routine X 2) w Reflex to ID Panel     Status: None   Collection Time: 11/02/17  8:11 PM  Result Value Ref Range Status   Specimen Description BLOOD RIGHT FOREARM  Final   Special Requests   Final    BOTTLES DRAWN AEROBIC ONLY Blood Culture adequate volume   Culture  Final    NO GROWTH 5 DAYS Performed at [facility], [address], [place], [place] [address]    Report Status 11/07/2017 FINAL  Final  Culture, blood (Routine X 2) w Reflex to ID Panel     Status: None   Collection Time: 11/02/17  8:11 PM  Result Value Ref Range Status   Specimen Description BLOOD RIGHT WRIST  Final   Special Requests   Final    BOTTLES DRAWN AEROBIC ONLY Blood Culture adequate volume   Culture   Final    NO GROWTH 5 DAYS Performed at [facility], [address], [place], [place] [address]    Report Status 11/07/2017 FINAL  Final  Respiratory Panel by PCR     Status: Abnormal   Collection Time: 11/02/17 10:13 PM  Result Value Ref Range Status   Adenovirus NOT DETECTED NOT DETECTED Final   Coronavirus 229E NOT DETECTED NOT DETECTED Final   Coronavirus HKU1 NOT DETECTED NOT DETECTED Final   Coronavirus NL63 NOT DETECTED NOT DETECTED Final   Coronavirus OC43 NOT DETECTED NOT DETECTED Final   Metapneumovirus NOT DETECTED NOT DETECTED Final   Rhinovirus / Enterovirus NOT DETECTED NOT DETECTED Final   Influenza A NOT DETECTED NOT DETECTED Final   Influenza B NOT DETECTED NOT DETECTED Final   Parainfluenza Virus 1 NOT DETECTED NOT DETECTED Final   Parainfluenza Virus 2 NOT DETECTED NOT DETECTED Final   Parainfluenza Virus 3 NOT DETECTED NOT DETECTED Final   Parainfluenza Virus 4 NOT DETECTED NOT DETECTED Final   Respiratory Syncytial Virus DETECTED (A) NOT DETECTED Final    Comment: CRITICAL RESULT  CALLED TO, READ BACK BY AND VERIFIED WITH: RN A HOBBS [id number] MLM    Bordetella pertussis NOT DETECTED NOT DETECTED Final   Chlamydophila pneumoniae NOT DETECTED NOT DETECTED Final   Mycoplasma pneumoniae NOT DETECTED NOT DETECTED Final    Comment: Performed at [facility], [place] [address], [place], [place] [address]  MRSA PCR Screening     Status: None   Collection Time: 11/03/17 10:00 AM  Result Value Ref Range Status   MRSA by PCR NEGATIVE NEGATIVE Final    Comment:        The GeneXpert MRSA Assay (FDA approved for NASAL specimens only), is one component of a comprehensive MRSA colonization surveillance program. It is not intended to diagnose MRSA infection nor to guide or monitor treatment for MRSA infections. Performed at [facility], [address] [place], [place] [address]          Radiology Studies: Ir Fluoro Guide Cv Line Right  Result Date: 11/09/2017 INDICATION: Acute on chronic renal failure. Please place tunneled dialysis catheter for the initiation of dialysis. EXAM: TUNNELED CENTRAL VENOUS HEMODIALYSIS CATHETER PLACEMENT WITH ULTRASOUND AND FLUOROSCOPIC GUIDANCE MEDICATIONS: Ancef 2 gm IV . The antibiotic was given in an appropriate time interval prior to skin puncture. ANESTHESIA/SEDATION: Versed 1.5 mg IV; Fentanyl 50 mcg IV; Moderate Sedation Time: 13 minutes The patient was continuously monitored during the procedure by the interventional radiology nurse under my direct supervision. FLUOROSCOPY TIME:  Fluoroscopy Time: 30 seconds (3 mGy). COMPLICATIONS: None immediate. PROCEDURE: Informed written consent was obtained from the patient after a discussion of the risks, benefits, and alternatives to treatment. Questions regarding the procedure were encouraged and answered. The right neck and chest were prepped with chlorhexidine in a sterile fashion, and a sterile drape was applied covering the operative field. Maximum barrier sterile technique with sterile  gowns and  gloves were used for the procedure. A timeout was performed prior to the initiation of the procedure. After creating a small venotomy incision, a micropuncture kit was utilized to access the internal jugular vein. Real-time ultrasound guidance was utilized for vascular access including the acquisition of a permanent ultrasound image documenting patency of the accessed vessel. The microwire was utilized to measure appropriate catheter length. A stiff Glidewire was advanced to the level of the IVC and the micropuncture sheath was exchanged for a peel-away sheath. A palindrome tunneled hemodialysis catheter measuring 19 cm from tip to cuff was tunneled in a retrograde fashion from the anterior chest wall to the venotomy incision. The catheter was then placed through the peel-away sheath with tips ultimately positioned within the superior aspect of the right atrium. Final catheter positioning was confirmed and documented with a spot radiographic image. The catheter aspirates and flushes normally. The catheter was flushed with appropriate volume heparin dwells. The catheter exit site was secured with a 0-Prolene retention suture. The venotomy incision was closed with an interrupted 4-0 Vicryl, Dermabond and Steri-strips. Dressings were applied. The patient tolerated the procedure well without immediate post procedural complication. IMPRESSION: Successful placement of 19 cm tip to cuff tunneled hemodialysis catheter via the right internal jugular vein with tips terminating within the superior aspect of the right atrium. The catheter is ready for immediate use. Electronically Signed   By: Sandi Mariscal M.D.   On: 11/09/2017 14:01   Ir US Guide Vasc Access Right  Result Date: 11/09/2017 INDICATION: Acute on chronic renal failure. Please place tunneled dialysis catheter for the initiation of dialysis. EXAM: TUNNELED CENTRAL VENOUS HEMODIALYSIS CATHETER PLACEMENT WITH ULTRASOUND AND FLUOROSCOPIC GUIDANCE  MEDICATIONS: Ancef 2 gm IV . The antibiotic was given in an appropriate time interval prior to skin puncture. ANESTHESIA/SEDATION: Versed 1.5 mg IV; Fentanyl 50 mcg IV; Moderate Sedation Time: 13 minutes The patient was continuously monitored during the procedure by the interventional radiology nurse under my direct supervision. FLUOROSCOPY TIME:  Fluoroscopy Time: 30 seconds (3 mGy). COMPLICATIONS: None immediate. PROCEDURE: Informed written consent was obtained from the patient after a discussion of the risks, benefits, and alternatives to treatment. Questions regarding the procedure were encouraged and answered. The right neck and chest were prepped with chlorhexidine in a sterile fashion, and a sterile drape was applied covering the operative field. Maximum barrier sterile technique with sterile gowns and gloves were used for the procedure. A timeout was performed prior to the initiation of the procedure. After creating a small venotomy incision, a micropuncture kit was utilized to access the internal jugular vein. Real-time ultrasound guidance was utilized for vascular access including the acquisition of a permanent ultrasound image documenting patency of the accessed vessel. The microwire was utilized to measure appropriate catheter length. A stiff Glidewire was advanced to the level of the IVC and the micropuncture sheath was exchanged for a peel-away sheath. A palindrome tunneled hemodialysis catheter measuring 19 cm from tip to cuff was tunneled in a retrograde fashion from the anterior chest wall to the venotomy incision. The catheter was then placed through the peel-away sheath with tips ultimately positioned within the superior aspect of the right atrium. Final catheter positioning was confirmed and documented with a spot radiographic image. The catheter aspirates and flushes normally. The catheter was flushed with appropriate volume heparin dwells. The catheter exit site was secured with a 0-Prolene  retention suture. The venotomy incision was closed with an interrupted 4-0 Vicryl, Dermabond and Steri-strips. Dressings were applied.  The patient tolerated the procedure well without immediate post procedural complication. IMPRESSION: Successful placement of 19 cm tip to cuff tunneled hemodialysis catheter via the right internal jugular vein with tips terminating within the superior aspect of the right atrium. The catheter is ready for immediate use. Electronically Signed   By: Sandi Mariscal M.D.   On: 11/09/2017 14:01        Scheduled Meds: . atorvastatin  40 mg Oral QHS  . brimonidine  1 drop Left Eye Daily   And  . timolol  1 drop Left Eye Daily  . calcitRIOL  0.25 mcg Oral Once per day on Mon Wed Fri  . calcium acetate  1,334 mg Oral TID WC  . febuxostat  40 mg Oral Daily  . feeding supplement  1 Container Oral TID BM  . folic acid  1 mg Oral Daily  . furosemide  80 mg Oral Daily  . hydroxychloroquine  300 mg Oral Daily  . insulin aspart  0-20 Units Subcutaneous TID WC  . insulin detemir  10 Units Subcutaneous QHS  . ipratropium-albuterol  3 mL Nebulization TID  . ketorolac  1 drop Left Eye Daily  . latanoprost  1 drop Left Eye QHS  . mouth rinse  15 mL Mouth Rinse BID  . metoprolol tartrate  2.5 mg Intravenous Q6H  . pantoprazole  40 mg Oral Daily  . [START ON 11/15/2017] predniSONE  10 mg Oral Q breakfast  . [START ON 11/14/2017] predniSONE  20 mg Oral Q breakfast  . [START ON 11/13/2017] predniSONE  30 mg Oral Q breakfast  . [START ON 11/12/2017] predniSONE  40 mg Oral Q breakfast  . [START ON 11/11/2017] predniSONE  50 mg Oral Q breakfast  . sodium bicarbonate  650 mg Oral TID  . warfarin  7.5 mg Oral Once  . Warfarin - Pharmacist Dosing Inpatient   Does not apply Q24H   Continuous Infusions: . sodium chloride    . sodium chloride    . sodium chloride    . sodium chloride    . sodium chloride    . sodium chloride       LOS: 8 days    Time spent: 25  minutes.    Lelon Frohlich, MD [facility] Pager [phone]  If 7PM-7AM, please contact night-coverage [email or web address] Password [facility] 11/10/2017, 3:59 PM

## 2017-11-10 NOTE — Consult Note (Signed)
   [facility] CM Inpatient Consult   11/10/2017  MAX ROMANO [birth year] [id number]   Patient screened for potential [facility] Management services. Patient is on the [facility] registry as a benefit of their [employer] . Electronic medical record reveals patient's discharge plan is SNF. [facility] care management needs. [facility] Care Management services not appropriate at this time. If patient's post hospital needs change please place a [facility] Care Management consult. For questions please contact:   Ruhani Umland RN, [facility] Liaison  [phone]) Business Mobile [phone]) Toll free office

## 2017-11-11 LAB — GLUCOSE, CAPILLARY
GLUCOSE-CAPILLARY: 286 mg/dL — AB (ref 65–99)
Glucose-Capillary: 298 mg/dL — ABNORMAL HIGH (ref 65–99)
Glucose-Capillary: 174 mg/dL — ABNORMAL HIGH (ref 65–99)
Glucose-Capillary: 320 mg/dL — ABNORMAL HIGH (ref 65–99)

## 2017-11-11 LAB — CBC
HCT: 36.3 % (ref 36.0–46.0)
Hemoglobin: 11.2 g/dL — ABNORMAL LOW (ref 12.0–15.0)
MCH: 27.3 pg (ref 26.0–34.0)
MCHC: 30.9 g/dL (ref 30.0–36.0)
MCV: 88.5 fL (ref 78.0–100.0)
PLATELETS: 409 10*3/uL — AB (ref 150–400)
RBC: 4.1 MIL/uL (ref 3.87–5.11)
RDW: 18.4 % — AB (ref 11.5–15.5)
WBC: 16.1 10*3/uL — ABNORMAL HIGH (ref 4.0–10.5)

## 2017-11-11 LAB — RENAL FUNCTION PANEL
ALBUMIN: 2.1 g/dL — AB (ref 3.5–5.0)
Anion gap: 13 (ref 5–15)
BUN: 69 mg/dL — AB (ref 6–20)
CALCIUM: 8.3 mg/dL — AB (ref 8.9–10.3)
CO2: 27 mmol/L (ref 22–32)
CREATININE: 2.85 mg/dL — AB (ref 0.44–1.00)
Chloride: 96 mmol/L — ABNORMAL LOW (ref 101–111)
GFR calc Af Amer: 18 mL/min — ABNORMAL LOW (ref >60)
GFR calc non Af Amer: 16 mL/min — ABNORMAL LOW (ref >60)
GLUCOSE: 334 mg/dL — AB (ref 65–99)
Phosphorus: 4.2 mg/dL (ref 2.5–4.6)
Potassium: 3.8 mmol/L (ref 3.5–5.1)
SODIUM: 136 mmol/L (ref 135–145)

## 2017-11-11 LAB — HEPATITIS B CORE ANTIBODY, TOTAL: Hep B Core Total Ab: NEGATIVE

## 2017-11-11 LAB — PROTIME-INR
INR: 1.49
PROTHROMBIN TIME: 17.9 s — AB (ref 11.4–15.2)

## 2017-11-11 LAB — HEPATITIS B SURFACE ANTIGEN: Hepatitis B Surface Ag: NEGATIVE

## 2017-11-11 LAB — HEPATITIS B SURFACE ANTIBODY, QUANTITATIVE: Hepatitis B-Post: 14.3 m[IU]/mL (ref >9.9)

## 2017-11-11 MED ORDER — WARFARIN SODIUM 7.5 MG PO TABS
7.5000 mg | ORAL_TABLET | Freq: Once | ORAL | Status: AC
  Administered 2017-11-11: 7.5 mg via ORAL
  Filled 2017-11-11: qty 1

## 2017-11-11 MED ORDER — INSULIN DETEMIR 100 UNIT/ML ~~LOC~~ SOLN
15.0000 [IU] | Freq: Every day | SUBCUTANEOUS | Status: DC
  Administered 2017-11-11 – 2017-11-12 (×2): 15 [IU] via SUBCUTANEOUS
  Filled 2017-11-11 (×5): qty 0.15

## 2017-11-11 MED ORDER — IPRATROPIUM-ALBUTEROL 0.5-2.5 (3) MG/3ML IN SOLN
3.0000 mL | Freq: Two times a day (BID) | RESPIRATORY_TRACT | Status: DC
  Administered 2017-11-11 – 2017-11-13 (×4): 3 mL via RESPIRATORY_TRACT
  Filled 2017-11-11 (×3): qty 3

## 2017-11-11 NOTE — Progress Notes (Signed)
Physical Therapy Treatment Patient Details Name: [name] MRN: [id number] DOB: 1946/02/10 Today's Date: 11/11/2017    History of Present Illness [name] is a 72 y.o. female PMH: CKD-IV c LUE fistula , CHF, hypo-Fe anemia, AF on warfarin, COPD, HTN, DM2, and SLE, presents to ED from PCP d/t 2-3d fever, ABD pain, emesis, nausea. [name]  Pt denies blood in emesis or stools and states ABD pain is chronic x6 months.  Prior ABD CT unremarkable, with gastroc FU on 2/14.  H/o chronic RLE wounds managed at home. Pt is scheduled for IR at [facility] on 2/11 for tunneled catheter placement.      PT Comments    Pt laying supine with husband present.  Agreeable to treatment today.  Pt with min guard assist with all mobility due to weakness and safety.  Pt able to ambulate further today, however towards end of ambulation, very fatigued with forward bend posturing and grabbing outside of walker for objects (I.e. bedrail and door knobs).  Pt required cues to maintain posturing and only use walker when ambulating.  PT able to return to supine positioning with verbal cues and slow mobility.             Precautions / Restrictions Precautions Precautions: Fall Restrictions Weight Bearing Restrictions: No    Mobility  Bed Mobility Overal bed mobility: Needs Assistance Bed Mobility: Supine to Sit;Sit to Supine     Supine to sit: Min guard Sit to supine: Min guard   General bed mobility comments: mod to max assist to position self in bed  Transfers Overall transfer level: Needs assistance Equipment used: Rolling walker (2 wheeled) Transfers: Sit to/from Stand Sit to Stand: Min assist         General transfer comment: slow, unsteady movement, forward bent posturing and reaching for objects  Ambulation/Gait   Ambulation Distance (Feet): 15 Feet Assistive device: Rolling walker (2 wheeled) Gait Pattern/deviations: Decreased step length -  right;Decreased step length - left;Decreased stride length;Trunk flexed;Shuffle     General Gait Details: limited due to fatigue and generlized weakness         Cognition Arousal/Alertness: Awake/alert Behavior During Therapy: WFL for tasks assessed/performed Overall Cognitive Status: Within Functional Limits for tasks assessed                                 General Comments: requires moderate-max verbal and tactile cues to attend to task of supine exercises.              Pertinent Vitals/Pain Pain Assessment: No/denies pain           PT Goals (current goals can now be found in the care plan section) Progress towards PT goals: Progressing toward goals       PT Plan Current plan remains appropriate               Time: 1355-1420 PT Time Calculation (min) (ACUTE ONLY): 25 min  Charges:  $Gait Training: 8-22 mins $Therapeutic Activity: 8-22 mins                      [name], PTA/CLT [place], [name] 11/11/2017, 6:08 PM

## 2017-11-11 NOTE — Progress Notes (Signed)
[name]  MRN: [id number]  DOB/AGE: [birth year] 72 y.o.  Primary Care Physician:Golding, Jenny Reichmann, MD  Admit date: 11/02/2017  Chief Complaint:  Chief Complaint  Patient presents with  . Emesis    S-Pt presented on  11/02/2017 with  Chief Complaint  Patient presents with  . Emesis  .     Pt says " I am doing better than before"    Pt offers no new complaints.    Meds . atorvastatin  40 mg Oral QHS  . brimonidine  1 drop Left Eye Daily   And  . timolol  1 drop Left Eye Daily  . calcitRIOL  0.25 mcg Oral Once per day on Mon Wed Fri  . calcium acetate  1,334 mg Oral TID WC  . febuxostat  40 mg Oral Daily  . feeding supplement  1 Container Oral TID BM  . folic acid  1 mg Oral Daily  . furosemide  80 mg Oral Daily  . hydroxychloroquine  300 mg Oral Daily  . insulin aspart  0-20 Units Subcutaneous TID WC  . insulin detemir  10 Units Subcutaneous QHS  . ipratropium-albuterol  3 mL Nebulization TID  . ketorolac  1 drop Left Eye Daily  . latanoprost  1 drop Left Eye QHS  . mouth rinse  15 mL Mouth Rinse BID  . metoprolol tartrate  2.5 mg Intravenous Q6H  . pantoprazole  40 mg Oral Daily  . [START ON 11/15/2017] predniSONE  10 mg Oral Q breakfast  . [START ON 11/14/2017] predniSONE  20 mg Oral Q breakfast  . [START ON 11/13/2017] predniSONE  30 mg Oral Q breakfast  . [START ON 11/12/2017] predniSONE  40 mg Oral Q breakfast  . predniSONE  50 mg Oral Q breakfast  . sodium bicarbonate  650 mg Oral TID  . Warfarin - Pharmacist Dosing Inpatient   Does not apply Q24H          Physical Exam: Vital signs in last 24 hours: Temp:  [98.2 F (36.8 C)-98.9 F (37.2 C)] 98.4 F (36.9 C) (02/13 0628) Pulse Rate:  [83-91] 83 (02/13 0628) Resp:  [16] 16 (02/12 1520) BP: (103-208)/(58-80) 136/68 (02/13 0628) SpO2:  [95 %-100 %] 98 % (02/13 0718) Weight:  [181 lb 14.1 oz (82.5 kg)-187 lb 2.7 oz (84.9 kg)] 181 lb 14.1 oz (82.5 kg) (02/13 0628) Weight change: 2 lb 6.8 oz (1.1  kg) Last BM Date: 11/08/17  Intake/Output from previous day: 02/12 0701 - 02/13 0700 In: 240 [P.O.:240] Out: 1000  No intake/output data recorded.   Physical Exam: General- pt is awake,alert, oriented to time place and person Resp- No acute REsp distress,  No Rhonchi CVS- S1S2 regular ij rate and rhythm GIT- BS+, soft, NT, ND EXT- NO LE Edema, Cyanosis Access- left AVF not matured for use PC in situ  Lab Results: CBC Recent Labs    11/10/17 0507 11/11/17 0415  WBC 18.4* 16.1*  HGB 11.7* 11.2*  HCT 38.3 36.3  PLT 451* 409*    BMET Recent Labs    11/10/17 0507 11/11/17 0415  NA 141 136  K 3.9 3.8  CL 100* 96*  CO2 30 27  GLUCOSE 185* 334*  BUN 97* 69*  CREATININE 2.87* 2.85*  CALCIUM 8.5* 8.3*    Trend  Creat 11/09/17-HD initiated 2019  2.3=> 4.0=>4.4 2018    2.4--2.7 2017   2.2--2.6 2016   2.1--2.2 2015   2.2--3.2 2014   1.2--1.4 2013   1.1--1.6  KIDNEY, NEEDLE BIOPSY:done in June 2015 COLLAPSING VARIANT OF FOCAL AND SEGMENTAL GLOMERULOSCLEROSIS IN ASSOCIATION WITH DIABETIC GLOMERULOSCLEROSIS. SEVERE ARTERIOSCLEROSIS WITH MODERATE TO SEVERE TUBULOINTERSTITIAL SCARRING ANDSUPERIMPOSED ACUTE TUBULAR INJURY.    MICRO Recent Results (from the past 240 hour(s))  Culture, blood (Routine X 2) w Reflex to ID Panel     Status: None   Collection Time: 11/02/17  8:11 PM  Result Value Ref Range Status   Specimen Description BLOOD RIGHT FOREARM  Final   Special Requests   Final    BOTTLES DRAWN AEROBIC ONLY Blood Culture adequate volume   Culture   Final    NO GROWTH 5 DAYS Performed at [facility], [address], [place], [place] [address]    Report Status 11/07/2017 FINAL  Final  Culture, blood (Routine X 2) w Reflex to ID Panel     Status: None   Collection Time: 11/02/17  8:11 PM  Result Value Ref Range Status   Specimen Description BLOOD RIGHT WRIST  Final   Special Requests   Final    BOTTLES DRAWN AEROBIC ONLY Blood Culture adequate volume    Culture   Final    NO GROWTH 5 DAYS Performed at [facility], [address], [place], [place] [address]    Report Status 11/07/2017 FINAL  Final  Respiratory Panel by PCR     Status: Abnormal   Collection Time: 11/02/17 10:13 PM  Result Value Ref Range Status   Adenovirus NOT DETECTED NOT DETECTED Final   Coronavirus 229E NOT DETECTED NOT DETECTED Final   Coronavirus HKU1 NOT DETECTED NOT DETECTED Final   Coronavirus NL63 NOT DETECTED NOT DETECTED Final   Coronavirus OC43 NOT DETECTED NOT DETECTED Final   Metapneumovirus NOT DETECTED NOT DETECTED Final   Rhinovirus / Enterovirus NOT DETECTED NOT DETECTED Final   Influenza A NOT DETECTED NOT DETECTED Final   Influenza B NOT DETECTED NOT DETECTED Final   Parainfluenza Virus 1 NOT DETECTED NOT DETECTED Final   Parainfluenza Virus 2 NOT DETECTED NOT DETECTED Final   Parainfluenza Virus 3 NOT DETECTED NOT DETECTED Final   Parainfluenza Virus 4 NOT DETECTED NOT DETECTED Final   Respiratory Syncytial Virus DETECTED (A) NOT DETECTED Final    Comment: CRITICAL RESULT CALLED TO, READ BACK BY AND VERIFIED WITH: RN A HOBBS [phone] MLM    Bordetella pertussis NOT DETECTED NOT DETECTED Final   Chlamydophila pneumoniae NOT DETECTED NOT DETECTED Final   Mycoplasma pneumoniae NOT DETECTED NOT DETECTED Final    Comment: Performed at [facility], [place] [address] [place], [place] [address]  MRSA PCR Screening     Status: None   Collection Time: 11/03/17 10:00 AM  Result Value Ref Range Status   MRSA by PCR NEGATIVE NEGATIVE Final    Comment:        The GeneXpert MRSA Assay (FDA approved for NASAL specimens only), is one component of a comprehensive MRSA colonization surveillance program. It is not intended to diagnose MRSA infection nor to guide or monitor treatment for MRSA infections. Performed at [facility], [address], [place], [place] [address]       Lab Results  Component Value Date   PTH 291.8 (H) 03/16/2014    CALCIUM 8.3 (L) 11/11/2017   CAION 1.08 (L) 08/31/2017   PHOS 4.2 11/11/2017               Impression: 1)Renal  Now ESRD  CKD stage 4.               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                HD initiated 11/09/17                PC placed 11/09/17    2)HTN  Medication- On Beta blockers   3)Anemia HGb at goal   4)CKD Mineral-Bone Disorder PTH elevated . Secondary Hyperparathyroidism  present. Phosphorus not at goal. Vitamin 25-OH will check.  5)Resp-admited with dyspnea  hx of COPD  RSV positicve PMD following  6)Electrolytes  Normokalemic  hyponatremic  7)Acid base Co2 now at goal     Plan:   Will continue current care No need for Hd today Will dialyze in am.    Richie Bonanno S 11/11/2017, 9:23 AM

## 2017-11-11 NOTE — Progress Notes (Signed)
PROGRESS NOTE    [name]  XFG:[id number] DOB: 11/06/1945 DOA: 11/02/2017 PCP: Sharilyn Sites, MD   Brief Narrative:   This is a 72 year old female who was admitted on 2/4 with worsening chronic abdominal pain along with nausea and vomiting.  She was also noted to have a nonproductive cough and low-grade fevers.  She was noted to be hypoxemic with worsening renal failure and on 2/7 INR was found to be greater than 10.  She was scheduled to start dialysis on 2/8 however hemodialysis nurse was unable to access her left upper arm AV fistula and therefore a tunneled dialysis catheter was placed on 2/11 with a short dialysis session that day and once again on 2/12.  She is currently stable for discharge to skilled nursing facility once outpatient hemodialysis slot has been arranged.  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 Acute hypoxemic respiratory failure secondary to acute COPD exacerbation with presumed aspiration pneumonia and respiratory virus panel positive for RSV.  This has now resolved and she is status post 7 days of Zosyn with no plans for further antibiotic therapy plan at this time. 2. Acute COPD exacerbation.   Continue duo nebs as needed as well as steroid titration. 3. End-stage renal disease now on hemodialysis.  Appreciate nephrology recommendations for dialysis and continuation of oral bicarb tablets.  She is currently awaiting outpatient dialysis slot. 4. UTI with completed treatment on 7 days of Zosyn. 5. Viral gastroenteritis-resolved. 6. Chronic diastolic heart failure with no acute component. 7. Paroxysmal atrial fibrillation.  Currently in sinus rhythm with resumption of warfarin after dialysis catheter placement with pharmacy to manage INR. 8. Systemic lupus.  Continue Plaquenil. 9. Essential hypertension.  Continue to monitor as this currently appears well-controlled. 10. Type 2 diabetes.  Improvement on Levemir, but increase to 15U today.  Continue to titrate steroids down.   DVT prophylaxis:Coumadin Code Status: Full Family Communication: None Disposition Plan: SNF once HD slot arranged   Consultants:   Nephrology  Procedures:   Echo on 2/5  Antimicrobials:   Zosyn and Ancef DC on 2/11.   Subjective: Patient seen and evaluated today with no new acute complaints or concerns. No acute concerns or events noted overnight.  Objective: Vitals:   11/10/17 1948 11/10/17 2106 11/11/17 0628 11/11/17 0718  BP:  103/69 136/68   Pulse:  91 83   Resp:      Temp:  98.9 F (37.2 C) 98.4 F (36.9 C)   TempSrc:  Oral Oral   SpO2: 95% 96% 100% 98%  Weight:   82.5 kg (181 lb 14.1 oz)   Height:        Intake/Output Summary (Last 24 hours) at 11/11/2017 1459 Last data filed  at 11/10/2017 1800 Gross per 24 hour  Intake -  Output 1000 ml  Net -1000 ml   Filed Weights   11/10/17 1520 11/10/17 1800 11/11/17 0628  Weight: 84.9 kg (187 lb 2.7 oz) 84.7 kg (186 lb 11.7 oz) 82.5 kg (181 lb 14.1 oz)    Examination:  General exam: Appears calm and comfortable  Respiratory system: Clear to auscultation. Respiratory effort normal. Cardiovascular system: S1 & S2 heard, RRR. No JVD,  murmurs, rubs, gallops or clicks. No pedal edema. Gastrointestinal system: Abdomen is nondistended, soft and nontender. No organomegaly or masses felt. Normal bowel sounds heard. Central nervous system: Alert and oriented. No focal neurological deficits. Extremities: Symmetric 5 x 5 power. Skin: No rashes, lesions or ulcers Psychiatry: Judgement and insight appear normal. Mood & affect appropriate.     Data Reviewed: I have personally reviewed following labs and imaging studies  CBC: Recent Labs  Lab 11/06/17 1654 11/08/17 0544 11/09/17 0459 11/10/17 0507 11/11/17 0415  WBC 16.5* 18.4* 15.9* 18.4* 16.1*  HGB 10.1* 11.3* 11.1* 11.7* 11.2*  HCT 32.4* 35.6* 35.6* 38.3 36.3  MCV 86.2 85.6 86.4 87.8 88.5  PLT 368 477* 477* 451* 619*   Basic Metabolic Panel: Recent Labs  Lab 11/06/17 0547 11/07/17 0647 11/08/17 0544 11/09/17 0459 11/10/17 0507 11/11/17 0415  NA 134* 138 139 138 141 136  K 4.1 4.2 3.4* 4.0 3.9 3.8  CL 99* 101 101 100* 100* 96*  CO2 20* 21* 23 24 30 27   GLUCOSE 155* 163* 187* 442* 185* 334*  BUN 128* 132* 141* 152* 97* 69*  CREATININE 4.56* 4.44* 4.30* 4.08* 2.87* 2.85*  CALCIUM 8.6* 8.9 9.0 8.7* 8.5* 8.3*  MG 2.4  --   --   --   --   --   PHOS 7.0* 6.2* 4.9* 5.5* 4.4 4.2   GFR: Estimated Creatinine Clearance: 18.8 mL/min (A) (by C-G formula based on SCr of 2.85 mg/dL (H)). Liver Function Tests: Recent Labs  Lab 11/07/17 0647 11/08/17 0544 11/09/17 0459 11/10/17 0507 11/11/17 0415  ALBUMIN 2.2* 2.1* 2.0* 2.3* 2.1*   No results for input(s): LIPASE, AMYLASE in the last 168 hours. No results for input(s): AMMONIA in the last 168 hours. Coagulation Profile: Recent Labs  Lab 11/07/17 0647 11/08/17 0544 11/09/17 0459 11/10/17 0507 11/11/17 0415  INR 1.68 1.45 1.42 1.32 1.49   Cardiac Enzymes: No results for input(s): CKTOTAL, CKMB, CKMBINDEX, TROPONINI in the last 168 hours. BNP (last 3 results) No results for input(s): PROBNP in the last  8760 hours. HbA1C: No results for input(s): HGBA1C in the last 72 hours. CBG: Recent Labs  Lab 11/10/17 1125 11/10/17 1706 11/10/17 2104 11/11/17 0748 11/11/17 1202  GLUCAP 225* 190* 356* 298* 286*   Lipid Profile: No results for input(s): CHOL, HDL, LDLCALC, TRIG, CHOLHDL, LDLDIRECT in the last 72 hours. Thyroid Function Tests: No results for input(s): TSH, T4TOTAL, FREET4, T3FREE, THYROIDAB in the last 72 hours. Anemia Panel: No results for input(s): VITAMINB12, FOLATE, FERRITIN, TIBC, IRON, RETICCTPCT in the last 72 hours. Sepsis Labs: No results for input(s): PROCALCITON, LATICACIDVEN in the last 168 hours.  Recent Results (from the past 240 hour(s))  Culture, blood (Routine X 2) w Reflex to ID Panel     Status: None   Collection Time: 11/02/17  8:11 PM  Result Value Ref Range Status   Specimen Description BLOOD RIGHT FOREARM  Final   Special Requests   Final    BOTTLES DRAWN AEROBIC ONLY Blood Culture adequate volume  Culture   Final    NO GROWTH 5 DAYS Performed at [facility], [address], [place], [place] [address]    Report Status 11/07/2017 FINAL  Final  Culture, blood (Routine X 2) w Reflex to ID Panel     Status: None   Collection Time: 11/02/17  8:11 PM  Result Value Ref Range Status   Specimen Description BLOOD RIGHT WRIST  Final   Special Requests   Final    BOTTLES DRAWN AEROBIC ONLY Blood Culture adequate volume   Culture   Final    NO GROWTH 5 DAYS Performed at [facility], [address], [place], [place] [address]    Report Status 11/07/2017 FINAL  Final  Respiratory Panel by PCR     Status: Abnormal   Collection Time: 11/02/17 10:13 PM  Result Value Ref Range Status   Adenovirus NOT DETECTED NOT DETECTED Final   Coronavirus 229E NOT DETECTED NOT DETECTED Final   Coronavirus HKU1 NOT DETECTED NOT DETECTED Final   Coronavirus NL63 NOT DETECTED NOT DETECTED Final   Coronavirus OC43 NOT DETECTED NOT DETECTED Final   Metapneumovirus NOT  DETECTED NOT DETECTED Final   Rhinovirus / Enterovirus NOT DETECTED NOT DETECTED Final   Influenza A NOT DETECTED NOT DETECTED Final   Influenza B NOT DETECTED NOT DETECTED Final   Parainfluenza Virus 1 NOT DETECTED NOT DETECTED Final   Parainfluenza Virus 2 NOT DETECTED NOT DETECTED Final   Parainfluenza Virus 3 NOT DETECTED NOT DETECTED Final   Parainfluenza Virus 4 NOT DETECTED NOT DETECTED Final   Respiratory Syncytial Virus DETECTED (A) NOT DETECTED Final    Comment: CRITICAL RESULT CALLED TO, READ BACK BY AND VERIFIED WITH: RN A HOBBS [phone] MLM    Bordetella pertussis NOT DETECTED NOT DETECTED Final   Chlamydophila pneumoniae NOT DETECTED NOT DETECTED Final   Mycoplasma pneumoniae NOT DETECTED NOT DETECTED Final    Comment: Performed at [facility], [place] [address], [place], [place] [address]  MRSA PCR Screening     Status: None   Collection Time: 11/03/17 10:00 AM  Result Value Ref Range Status   MRSA by PCR NEGATIVE NEGATIVE Final    Comment:        The GeneXpert MRSA Assay (FDA approved for NASAL specimens only), is one component of a comprehensive MRSA colonization surveillance program. It is not intended to diagnose MRSA infection nor to guide or monitor treatment for MRSA infections. Performed at [facility], [address] [place], [place] [address]          Radiology Studies: No results found.      Scheduled Meds: . atorvastatin  40 mg Oral QHS  . brimonidine  1 drop Left Eye Daily   And  . timolol  1 drop Left Eye Daily  . calcitRIOL  0.25 mcg Oral Once per day on Mon Wed Fri  . calcium acetate  1,334 mg Oral TID WC  . febuxostat  40 mg Oral Daily  . feeding supplement  1 Container Oral TID BM  . folic acid  1 mg Oral Daily  . furosemide  80 mg Oral Daily  . hydroxychloroquine  300 mg Oral Daily  . insulin aspart  0-20 Units Subcutaneous TID WC  . insulin detemir  10 Units Subcutaneous QHS  . ipratropium-albuterol  3 mL  Nebulization BID  . ketorolac  1 drop Left Eye Daily  . latanoprost  1 drop Left Eye QHS  . mouth rinse  15  mL Mouth Rinse BID  . metoprolol tartrate  2.5 mg Intravenous Q6H  . pantoprazole  40 mg Oral Daily  . [START ON 11/15/2017] predniSONE  10 mg Oral Q breakfast  . [START ON 11/14/2017] predniSONE  20 mg Oral Q breakfast  . [START ON 11/13/2017] predniSONE  30 mg Oral Q breakfast  . [START ON 11/12/2017] predniSONE  40 mg Oral Q breakfast  . sodium bicarbonate  650 mg Oral TID  . warfarin  7.5 mg Oral Once  . Warfarin - Pharmacist Dosing Inpatient   Does not apply Q24H   Continuous Infusions: . sodium chloride    . sodium chloride    . sodium chloride    . sodium chloride    . sodium chloride    . sodium chloride       LOS: 9 days    Time spent: 30 minutes    Monserat Prestigiacomo Darleen Crocker, DO [facility] Pager [phone]  If 7PM-7AM, please contact night-coverage [email or web address] Password [facility] 11/11/2017, 2:59 PM

## 2017-11-11 NOTE — Progress Notes (Signed)
ANTICOAGULATION CONSULT NOTE - follow up  Pharmacy Consult for Warfarin (home med) Indication: atrial fibrillation  Allergies  Allergen Reactions  . Ace Inhibitors Cough   Patient Measurements: Height: 5\' 4"  (162.6 cm) Weight: 181 lb 14.1 oz (82.5 kg) IBW/kg (Calculated) : 54.7  Vital Signs: Temp: 98.4 F (36.9 C) (02/13 0628) Temp Source: Oral (02/13 0628) BP: 136/68 (02/13 0628) Pulse Rate: 83 (02/13 0628)  Labs: Recent Labs    11/09/17 0459 11/10/17 0507 11/11/17 0415  HGB 11.1* 11.7* 11.2*  HCT 35.6* 38.3 36.3  PLT 477* 451* 409*  LABPROT 17.3* 16.2* 17.9*  INR 1.42 1.32 1.49  CREATININE 4.08* 2.87* 2.85*   Estimated Creatinine Clearance: 18.8 mL/min (A) (by C-G formula based on SCr of 2.85 mg/dL (H)).  Medical History: Past Medical History:  Diagnosis Date  . Anemia   . Arthritis   . Asthma   . Atrial fibrillation ([place])   . Atrial flutter [facility])    TEE/DCCV December 2013 - on Xarelto [facility])  . Bone spur of toe    Right 5th toe  . Chronic diastolic CHF (congestive heart failure) (HCC)    a. EF 50-55% by echo in 2015  . CKD (chronic kidney disease) stage 4, GFR 15-29 ml/min (HCC)    a. s/p fistula placement but not on HD. Followed by Nephrology.   Marland Kitchen COPD (chronic obstructive pulmonary disease) ([place])   . DVT of upper extremity (deep vein thrombosis) ([place])    Right basilic vein, June 4034  . Essential hypertension, benign   . Fractures   . SLE (systemic lupus erythematosus) ([place])   . Type 2 diabetes mellitus ([place])   . UTI (lower urinary tract infection)    Medications:  Medications Prior to Admission  Medication Sig Dispense Refill Last Dose  . acetaminophen (TYLENOL) 500 MG tablet Take 1,000 mg by mouth every 6 (six) hours as needed. For pain   Taking  . albuterol (ACCUNEB) 0.63 MG/3ML nebulizer solution Take 1 ampule by nebulization every 6 (six) hours as needed for wheezing or shortness of breath.    11/01/2017 at Unknown time  . albuterol (PROVENTIL  HFA;VENTOLIN HFA) 108 (90 BASE) MCG/ACT inhaler Inhale 2 puffs into the lungs every 6 (six) hours as needed. For shortness of breath   11/01/2017 at Unknown time  . amLODipine (NORVASC) 10 MG tablet Take 1 tablet (10 mg total) by mouth at bedtime. 30 tablet 0 11/02/2017 at Unknown time  . atorvastatin (LIPITOR) 40 MG tablet Take 40 mg at bedtime by mouth.    11/01/2017 at Unknown time  . brimonidine-timolol (COMBIGAN) 0.2-0.5 % ophthalmic solution Place 1 drop into the left eye daily.    11/01/2017 at Unknown time  . calcitRIOL (ROCALTROL) 0.25 MCG capsule Take 0.25 mcg by mouth as directed. Take on Mondays Wednesdays and Fridays   11/02/2017 at Unknown time  . diclofenac (VOLTAREN) 0.1 % ophthalmic solution Place 1 drop into the left eye daily. Uses at lunch time   11/01/2017 at Unknown time  . dicyclomine (BENTYL) 10 MG capsule Take 10 mg by mouth 4 (four) times daily -  before meals and at bedtime.   11/02/2017 at Unknown time  . febuxostat (ULORIC) 40 MG tablet Take 40 mg by mouth daily.   11/02/2017 at Unknown time  . folic acid (FOLVITE) 1 MG tablet Take 1 mg by mouth daily.   11/02/2017 at Unknown time  . furosemide (LASIX) 80 MG tablet Take 160 mg by mouth 2 (two) times daily.  11/02/2017 at Unknown time  . HUMALOG KWIKPEN 100 UNIT/ML KiwkPen INJECT UP TO 11 UNITS SUBCUTANEOUSLY BEFORE MEALS THREE TIMES DAILY. 15 mL 2 11/02/2017 at Unknown time  . HYDROcodone-acetaminophen (NORCO/VICODIN) 5-325 MG tablet 1 or 2 tabs PO q12 hours prn pain 10 tablet 0 11/01/2017 at Unknown time  . hydroxychloroquine (PLAQUENIL) 200 MG tablet Take 200 mg by mouth as directed. Take 1 1/2 tablets daily   11/02/2017 at 900  . Insulin Detemir (LEVEMIR FLEXTOUCH) 100 UNIT/ML Pen Inject 10 Units into the skin daily at 10 pm. 5 pen 2 11/01/2017 at Unknown time  . metoprolol tartrate (LOPRESSOR) 50 MG tablet TAKE ONE TABLET BY MOUTH TWICE DAILY. 60 tablet 6 11/02/2017 at 900  . Multiple Vitamins-Minerals (CENTRUM SILVER PO) Take 1 tablet by mouth  daily.   11/02/2017 at Unknown time  . ONE TOUCH ULTRA TEST test strip TEST 4 TIMES A DAY AS DIRECTED. 150 each 5 11/02/2017 at Unknown time  . ONETOUCH DELICA LANCETS 53Z MISC USE TO TEST 4 TIMES DAILY. 100 each 5 11/02/2017 at Unknown time  . predniSONE (DELTASONE) 5 MG tablet Take 5 mg by mouth daily with breakfast.   11/02/2017 at Unknown time  . travoprost, benzalkonium, (TRAVATAN) 0.004 % ophthalmic solution Place 1 drop into the left eye at bedtime.   11/01/2017 at Unknown time  . UNIFINE PENTIPS 31G X 6 MM MISC USE AS DIRECTED AT BEDTIME WITH LEVEMIR AND WITH SLIDING SCALE INSULIN. 150 each 5 11/02/2017 at Unknown time  . Vitamin D, Ergocalciferol, (DRISDOL) 50000 units CAPS capsule TAKE 1 CAPSULE BY MOUTH ONCE WEEKLY. 12 capsule 0 Past Week at Unknown time  . warfarin (COUMADIN) 5 MG tablet Take 1 tablet daily (Patient taking differently: Take 5 mg by mouth daily at 6 PM. Take 1 tablet daily) 180 tablet 3 11/01/2017 at 1800    Home dose = 5mg  daily per PTA med list.  Assessment: 72 yo woman admitted due to renal failure, acute on chronic. INR was supratherapeutic > 10 with no e/o bleeding. Vitamin K 5mg  po given x 1 on 2/7.  Coumadin on hold for placement of tunnel catheter at [facility] on 11/09/17 which was completed. INR remains below goal.   Goal of Therapy:  INR 2-3   Plan:  Coumadin 7.5mg  x 1 today (to boost INR) Daily PT/INR Monitor for s/s of bleeding   11/11/2017 10:28 AM

## 2017-11-12 ENCOUNTER — Ambulatory Visit: Payer: Medicare Other | Admitting: Gastroenterology

## 2017-11-12 LAB — GLUCOSE, CAPILLARY
GLUCOSE-CAPILLARY: 199 mg/dL — AB (ref 65–99)
Glucose-Capillary: 261 mg/dL — ABNORMAL HIGH (ref 65–99)
Glucose-Capillary: 192 mg/dL — ABNORMAL HIGH (ref 65–99)
Glucose-Capillary: 180 mg/dL — ABNORMAL HIGH (ref 65–99)

## 2017-11-12 LAB — BASIC METABOLIC PANEL
Anion gap: 14 (ref 5–15)
BUN: 92 mg/dL — AB (ref 6–20)
CHLORIDE: 95 mmol/L — AB (ref 101–111)
CO2: 27 mmol/L (ref 22–32)
Calcium: 8.4 mg/dL — ABNORMAL LOW (ref 8.9–10.3)
Creatinine, Ser: 3.53 mg/dL — ABNORMAL HIGH (ref 0.44–1.00)
GFR calc Af Amer: 14 mL/min — ABNORMAL LOW (ref >60)
GFR calc non Af Amer: 12 mL/min — ABNORMAL LOW (ref >60)
Glucose, Bld: 347 mg/dL — ABNORMAL HIGH (ref 65–99)
Potassium: 3.8 mmol/L (ref 3.5–5.1)
Sodium: 136 mmol/L (ref 135–145)

## 2017-11-12 LAB — PROTIME-INR
INR: 1.51
Prothrombin Time: 18 seconds — ABNORMAL HIGH (ref 11.4–15.2)

## 2017-11-12 MED ORDER — MAGIC MOUTHWASH
10.0000 mL | Freq: Three times a day (TID) | ORAL | Status: DC
  Administered 2017-11-12 – 2017-11-13 (×5): 10 mL via ORAL
  Filled 2017-11-12 (×5): qty 10

## 2017-11-12 MED ORDER — HEPARIN SODIUM (PORCINE) 1000 UNIT/ML IJ SOLN
INTRAMUSCULAR | Status: AC
Start: 2017-11-12 — End: 2017-11-12
  Administered 2017-11-12: 3200 [IU] via INTRAVENOUS_CENTRAL
  Filled 2017-11-12: qty 6

## 2017-11-12 MED ORDER — WARFARIN SODIUM 5 MG PO TABS
5.0000 mg | ORAL_TABLET | Freq: Once | ORAL | Status: AC
  Administered 2017-11-12: 5 mg via ORAL
  Filled 2017-11-12: qty 1

## 2017-11-12 MED ORDER — INSULIN ASPART 100 UNIT/ML ~~LOC~~ SOLN
3.0000 [IU] | Freq: Three times a day (TID) | SUBCUTANEOUS | Status: DC
  Administered 2017-11-12 – 2017-11-13 (×6): 3 [IU] via SUBCUTANEOUS

## 2017-11-12 MED ORDER — NYSTATIN 100000 UNIT/ML MT SUSP
5.0000 mL | Freq: Four times a day (QID) | OROMUCOSAL | Status: DC
  Administered 2017-11-12 – 2017-11-13 (×5): 500000 [IU] via ORAL
  Filled 2017-11-12 (×5): qty 5

## 2017-11-12 NOTE — Procedures (Signed)
    HEMODIALYSIS TREATMENT NOTE:   3 hour heparin-free dialysis completed via right IJ tunneled catheter. Exit site unremarkable. Goal met: 1 liter removed without interruption in ultrafiltration.  All blood was returned.  Hemodynamically stable throughout HD session.  Report given to Weyman Pedro, RN.  Rockwell Alexandria, RN, CDN

## 2017-11-12 NOTE — Progress Notes (Signed)
Physical Therapy Treatment Patient Details Name: [name] MRN: [id number] DOB: 1945-12-17 Today's Date: 11/12/2017    History of Present Illness [name] is a 72 y.o. female PMH: CKD-IV c LUE fistula , CHF, hypo-Fe anemia, AF on warfarin, COPD, HTN, DM2, and SLE, presents to ED from PCP d/t 2-3d fever, ABD pain, emesis, nausea. [name]  Pt denies blood in emesis or stools and states ABD pain is chronic x6 months.  Prior ABD CT unremarkable, with gastroc FU on 2/14.  H/o chronic RLE wounds managed at home. Pt is scheduled for IR at [facility] on 2/11 for tunneled catheter placement.      PT Comments    Patient able transfer to [facility] and later to chair demonstrating slow labored movement, limited to 4-5 steps before having to sit due to fatigue, s/p dialysis and tolerated sitting up in chair after therapy with family member present in room.  Patient will benefit from continued physical therapy in hospital and recommended venue below to increase strength, balance, endurance for safe ADLs and gait.    Follow Up Recommendations  SNF     Equipment Recommendations  None recommended by PT    Recommendations for Other Services       Precautions / Restrictions Precautions Precautions: Fall Restrictions Weight Bearing Restrictions: No    Mobility  Bed Mobility Overal bed mobility: Needs Assistance Bed Mobility: Supine to Sit;Sit to Supine     Supine to sit: Min guard Sit to supine: Min guard   General bed mobility comments: required use of siderail and head of bed raised  Transfers Overall transfer level: Needs assistance Equipment used: Rolling walker (2 wheeled) Transfers: Sit to/from [facility] Sit to Stand: Min assist Stand pivot transfers: Min assist       General transfer comment: slow labored movement  Ambulation/Gait Ambulation/Gait assistance: Min assist Ambulation Distance (Feet): 4  Feet Assistive device: Rolling walker (2 wheeled) Gait Pattern/deviations: Decreased step length - right;Decreased step length - left;Decreased stride length   Gait velocity interpretation: Below normal speed for age/gender General Gait Details: slow labored steps with wide base of support, limited secondary to fatigue, s/p dialysis earlier today   Stairs            Wheelchair Mobility    Modified Rankin (Stroke Patients Only)       Balance Overall balance assessment: Needs assistance Sitting-balance support: No upper extremity supported;Feet supported Sitting balance-Leahy Scale: Good     Standing balance support: Bilateral upper extremity supported;During functional activity Standing balance-Leahy Scale: Fair Standing balance comment: fair/poor with RW                            Cognition Arousal/Alertness: Awake/alert Behavior During Therapy: WFL for tasks assessed/performed Overall Cognitive Status: Within Functional Limits for tasks assessed                                        Exercises General Exercises - Lower Extremity Ankle Circles/Pumps: Both;10 reps;Seated;AROM;Strengthening Long Arc Quad: Seated;AROM;Strengthening;Both;10 reps Hip Flexion/Marching: Seated;AROM;Strengthening;Both;10 reps    General Comments        Pertinent Vitals/Pain Pain Assessment: 0-10 Pain Score: 9  Pain Location: right hip/flank Pain Descriptors / Indicators: Aching Pain Intervention(s): Limited activity within patient's tolerance;Monitored during session;Patient requesting pain meds-RN notified  Home Living                      Prior Function            PT Goals (current goals can now be found in the care plan section) Acute Rehab PT Goals Patient Stated Goal: return home after rehab PT Goal Formulation: With patient Time For Goal Achievement: 11/19/17 Potential to Achieve Goals: Good Progress towards PT goals: Progressing  toward goals    Frequency    Min 3X/week      PT Plan Current plan remains appropriate    Co-evaluation              AM-PAC PT "6 Clicks" Daily Activity  Outcome Measure  Difficulty turning over in bed (including adjusting bedclothes, sheets and blankets)?: A Little Difficulty moving from lying on back to sitting on the side of the bed? : A Little Difficulty sitting down on and standing up from a chair with arms (e.g., wheelchair, bedside commode, etc,.)?: A Lot Help needed moving to and from a bed to chair (including a wheelchair)?: A Lot Help needed walking in hospital room?: A Lot Help needed climbing 3-5 steps with a railing? : Total 6 Click Score: 13    End of Session   Activity Tolerance: Patient tolerated treatment well;Patient limited by fatigue Patient left: in chair;with call bell/phone within reach;with family/visitor present Nurse Communication: Mobility status PT Visit Diagnosis: Unsteadiness on feet (R26.81);Other abnormalities of gait and mobility (R26.89);Muscle weakness (generalized) (M62.81)     Time: 8841-6606 PT Time Calculation (min) (ACUTE ONLY): 26 min  Charges:  $Therapeutic Activity: 23-37 mins                    G Codes:       3:59 PM, 11-16-2017 Lonell Grandchild, MPT Physical Therapist with [facility] [phone] office [phone] mobile phone

## 2017-11-12 NOTE — Progress Notes (Signed)
Inpatient Diabetes Program Recommendations  AACE/ADA: New Consensus Statement on Inpatient Glycemic Control (2015)  Target Ranges:  Prepandial:   less than 140 mg/dL      Peak postprandial:   less than 180 mg/dL (1-2 hours)      Critically ill patients:  140 - 180 mg/dL   Results for TALULA, ISLAND (MRN [id number]) as of 11/12/2017 10:37  Ref. Range 11/11/2017 07:48 11/11/2017 12:02 11/11/2017 16:46 11/11/2017 22:15 11/12/2017 07:56  Glucose-Capillary Latest Ref Range: 65 - 99 mg/dL 298 (H) 286 (H) 174 (H) 320 (H) 261 (H)   Review of Glycemic Control  Diabetes history: DM2 Outpatient Diabetes medications: Levemir 10 units QHS, Humalog 11 units TID with meals Current orders for Inpatient glycemic control: Levemir 15 units QHS, Novolog 3 units TID with meals for meal coverage, Novolog 0-20 units TID with meals;  Prednisone taper  Inpatient Diabetes Program Recommendations:  Correction (SSI): Bedtime glucose 320 mg/dl last night and no insulin given since no bedtime correction ordered. Please consider ordering Novolog 0-5 units for bedtime correction scale. Insulin - Meal Coverage: Noted Novolog 3 units TID with meals ordered today.  Diet: Added Carb Modified to Renal diet.  Thanks, Barnie Alderman, RN, MSN, CDE Diabetes Coordinator Inpatient Diabetes Program [phone] (Team Pager from 8am to 5pm)

## 2017-11-12 NOTE — Clinical Social Work Note (Signed)
LCSW spoke with Jasmine at [facility] and was advised that patient's clinicals would be forwarded to the [facility] for review by their medical director. LCSW discussed the out of ordinary length of time the referral was taking to be assigned and was advised that due to issues at central intake that patient's case had been escalated in an effort to have a chair assigned.   Jasmine to return contact to LCSW with chair time once [facility] reviews.     Mitra Duling, Clydene Pugh, LCSW

## 2017-11-12 NOTE — Progress Notes (Signed)
SLP Cancellation Note  Patient Details Name: [name] MRN: [id number] DOB: 1946/01/18   Cancelled treatment:       Reason Eval/Treat Not Completed: Patient at procedure or test/unavailable; Attempted to see earlier this afternoon, however Pt receiving hemodialysis.  Thank you,  Genene Churn, [place]    [place] 11/12/2017, 5:14 PM

## 2017-11-12 NOTE — Progress Notes (Signed)
PROGRESS NOTE    [name]  ZOX:[id number] DOB: Jun 05, 1946 DOA: 11/02/2017 PCP: Sharilyn Sites, MD   Brief Narrative:   This is a 72 year old female who was admitted on 2/4 with worsening chronic abdominal pain along with nausea and vomiting.  She was also noted to have a nonproductive cough and low-grade fevers.  She was noted to be hypoxemic with worsening renal failure and on 2/7 INR was found to be greater than 10.  She was scheduled to start dialysis on 2/8 however hemodialysis nurse was unable to access her left upper arm AV fistula and therefore a tunneled dialysis catheter was placed on 2/11 with a short dialysis session that day and once again on 2/12. She has had repeated HD on 2/14 with 1L of fluid removed.  She is currently stable for discharge to skilled nursing facility once outpatient hemodialysis slot has been arranged.  Assessment & Plan:   Principal Problem:   Renal failure (ARF), acute on chronic (HCC) Active Problems:   Atrial flutter (HCC)   SLE (systemic lupus erythematosus) (HCC)   Essential hypertension, benign   COPD (chronic obstructive pulmonary disease) (HCC)   Anemia of chronic renal failure, stage 4 (severe) (HCC)   Acute respiratory failure with hypoxia (HCC)   Chronic diastolic heart failure (HCC)   Focal segmental glomerulosclerosis without nephrosis or chronic glomerulonephritis   Mixed hyperlipidemia   Type 2 diabetes mellitus with stage 4 chronic kidney disease (HCC)   Iron deficiency anemia   Community acquired pneumonia   Nausea and vomiting   AKI (acute kidney injury) ([place])   Acute renal failure superimposed on stage 4 chronic kidney disease (HCC)   COPD with acute exacerbation ([place])   1. Acute hypoxemic respiratory failure secondary to acute COPD exacerbation with presumed aspiration pneumonia and respiratory virus panel positive for RSV.  This has now resolved and she is status post 7 days of Zosyn with no plans for further antibiotic  therapy plan at this time. 2. Oral thrush. Magic mouthwash and nystatin swish and swallow. 3. Acute COPD exacerbation.  Continue duo nebs as needed as well as steroid titration. 4. End-stage renal disease now on hemodialysis.  Appreciate nephrology recommendations for dialysis and continuation of oral bicarb tablets.  She is currently awaiting outpatient dialysis slot. 5. UTI with completed treatment on 7 days of Zosyn. 6. Viral gastroenteritis-resolved. 7. Chronic diastolic heart failure with no acute component. 8. Paroxysmal atrial fibrillation.  Currently in sinus rhythm with resumption of warfarin after dialysis catheter placement with pharmacy to manage INR. 9. Systemic lupus.  Continue Plaquenil. 10. Essential hypertension.  Continue to monitor as this currently appears well-controlled. 11. Type 2 diabetes.  Improvement on Levemir, added mealtime insulin today.  Continue to titrate steroids down.   DVT prophylaxis:Coumadin Code Status: Full Family Communication: None Disposition Plan: SNF once HD slot arranged   Consultants:   Nephrology  Procedures:   Echo on 2/5  Antimicrobials:   Zosyn and Ancef DC on 2/11.   Subjective: Patient seen and evaluated today with no new acute complaints or concerns. No acute concerns or events noted overnight. She is noted to have some oral thrush.  Objective: Vitals:   11/12/17 1230 11/12/17 1300 11/12/17 1330 11/12/17 1337  BP: (!) 153/52 (!) 113/52 (!) 138/51 (!) 152/56  Pulse: 91 80 82 84  Resp:    18  Temp:    98.2 F (36.8 C)  TempSrc:    Oral  SpO2:    96%  Weight:    82.3 kg (181 lb 7 oz)  Height:        Intake/Output Summary (Last 24 hours) at 11/12/2017 1636 Last data filed at 11/12/2017 1330 Gross per 24 hour  Intake 560 ml  Output 1450 ml  Net -890 ml   Filed Weights   11/12/17 0500 11/12/17 1025 11/12/17 1337  Weight: 83.2 kg (183 lb 6.8 oz) 83.4 kg (183 lb 13.8 oz) 82.3 kg (181 lb 7 oz)     Examination:  General exam: Appears calm and comfortable  Respiratory system: Clear to auscultation. Respiratory effort normal. Cardiovascular system: S1 & S2 heard, RRR. No JVD, murmurs, rubs, gallops or clicks. No pedal edema. Gastrointestinal system: Abdomen is nondistended, soft and nontender. No organomegaly or masses felt. Normal bowel sounds heard. Central nervous system: Alert and oriented. No focal neurological deficits. Extremities: Symmetric 5 x 5 power. Skin: No rashes, lesions or ulcers Psychiatry: Judgement and insight appear normal. Mood & affect appropriate.     Data Reviewed: I have personally reviewed following labs and imaging studies  CBC: Recent Labs  Lab 11/06/17 1654 11/08/17 0544 11/09/17 0459 11/10/17 0507 11/11/17 0415  WBC 16.5* 18.4* 15.9* 18.4* 16.1*  HGB 10.1* 11.3* 11.1* 11.7* 11.2*  HCT 32.4* 35.6* 35.6* 38.3 36.3  MCV 86.2 85.6 86.4 87.8 88.5  PLT 368 477* 477* 451* 465*   Basic Metabolic Panel: Recent Labs  Lab 11/06/17 0547 11/07/17 0647 11/08/17 0544 11/09/17 0459 11/10/17 0507 11/11/17 0415 11/12/17 0540  NA 134* 138 139 138 141 136 136  K 4.1 4.2 3.4* 4.0 3.9 3.8 3.8  CL 99* 101 101 100* 100* 96* 95*  CO2 20* 21* 23 24 30 27 27   GLUCOSE 155* 163* 187* 442* 185* 334* 347*  BUN 128* 132* 141* 152* 97* 69* 92*  CREATININE 4.56* 4.44* 4.30* 4.08* 2.87* 2.85* 3.53*  CALCIUM 8.6* 8.9 9.0 8.7* 8.5* 8.3* 8.4*  MG 2.4  --   --   --   --   --   --   PHOS 7.0* 6.2* 4.9* 5.5* 4.4 4.2  --    GFR: Estimated Creatinine Clearance: 15.2 mL/min (A) (by C-G formula based on SCr of 3.53 mg/dL (H)). Liver Function Tests: Recent Labs  Lab 11/07/17 0647 11/08/17 0544 11/09/17 0459 11/10/17 0507 11/11/17 0415  ALBUMIN 2.2* 2.1* 2.0* 2.3* 2.1*   No results for input(s): LIPASE, AMYLASE in the last 168 hours. No results for input(s): AMMONIA in the last 168 hours. Coagulation Profile: Recent Labs  Lab 11/08/17 0544 11/09/17 0459  11/10/17 0507 11/11/17 0415 11/12/17 0540  INR 1.45 1.42 1.32 1.49 1.51   Cardiac Enzymes: No results for input(s): CKTOTAL, CKMB, CKMBINDEX, TROPONINI in the last 168 hours. BNP (last 3 results) No results for input(s): PROBNP in the last 8760 hours. HbA1C: No results for input(s): HGBA1C in the last 72 hours. CBG: Recent Labs  Lab 11/11/17 1646 11/11/17 2215 11/12/17 0756 11/12/17 1148 11/12/17 1627  GLUCAP 174* 320* 261* 199* 192*   Lipid Profile: No results for input(s): CHOL, HDL, LDLCALC, TRIG, CHOLHDL, LDLDIRECT in the last 72 hours. Thyroid Function Tests: No results for input(s): TSH, T4TOTAL, FREET4, T3FREE, THYROIDAB in the last 72 hours. Anemia Panel: No results for input(s): VITAMINB12, FOLATE, FERRITIN, TIBC, IRON, RETICCTPCT in the last 72 hours. Sepsis Labs: No results for input(s): PROCALCITON, LATICACIDVEN in the last 168 hours.  Recent Results (from the past 240 hour(s))  Culture, blood (Routine X 2) w Reflex to ID Panel  Status: None   Collection Time: 11/02/17  8:11 PM  Result Value Ref Range Status   Specimen Description BLOOD RIGHT FOREARM  Final   Special Requests   Final    BOTTLES DRAWN AEROBIC ONLY Blood Culture adequate volume   Culture   Final    NO GROWTH 5 DAYS Performed at [facility], [address] [place], [place] [address]    Report Status 11/07/2017 FINAL  Final  Culture, blood (Routine X 2) w Reflex to ID Panel     Status: None   Collection Time: 11/02/17  8:11 PM  Result Value Ref Range Status   Specimen Description BLOOD RIGHT WRIST  Final   Special Requests   Final    BOTTLES DRAWN AEROBIC ONLY Blood Culture adequate volume   Culture   Final    NO GROWTH 5 DAYS Performed at [facility], [address] [place], [place] [address]    Report Status 11/07/2017 FINAL  Final  Respiratory Panel by PCR     Status: Abnormal   Collection Time: 11/02/17 10:13 PM  Result Value Ref Range Status   Adenovirus NOT DETECTED NOT  DETECTED Final   Coronavirus 229E NOT DETECTED NOT DETECTED Final   Coronavirus HKU1 NOT DETECTED NOT DETECTED Final   Coronavirus NL63 NOT DETECTED NOT DETECTED Final   Coronavirus OC43 NOT DETECTED NOT DETECTED Final   Metapneumovirus NOT DETECTED NOT DETECTED Final   Rhinovirus / Enterovirus NOT DETECTED NOT DETECTED Final   Influenza A NOT DETECTED NOT DETECTED Final   Influenza B NOT DETECTED NOT DETECTED Final   Parainfluenza Virus 1 NOT DETECTED NOT DETECTED Final   Parainfluenza Virus 2 NOT DETECTED NOT DETECTED Final   Parainfluenza Virus 3 NOT DETECTED NOT DETECTED Final   Parainfluenza Virus 4 NOT DETECTED NOT DETECTED Final   Respiratory Syncytial Virus DETECTED (A) NOT DETECTED Final    Comment: CRITICAL RESULT CALLED TO, READ BACK BY AND VERIFIED WITH: RN A HOBBS [phone] MLM    Bordetella pertussis NOT DETECTED NOT DETECTED Final   Chlamydophila pneumoniae NOT DETECTED NOT DETECTED Final   Mycoplasma pneumoniae NOT DETECTED NOT DETECTED Final    Comment: Performed at [facility], [place] [address], [place], [place] [address]  MRSA PCR Screening     Status: None   Collection Time: 11/03/17 10:00 AM  Result Value Ref Range Status   MRSA by PCR NEGATIVE NEGATIVE Final    Comment:        The GeneXpert MRSA Assay (FDA approved for NASAL specimens only), is one component of a comprehensive MRSA colonization surveillance program. It is not intended to diagnose MRSA infection nor to guide or monitor treatment for MRSA infections. Performed at [facility], [address], [place], [place] [address]          Radiology Studies: No results found.      Scheduled Meds: . atorvastatin  40 mg Oral QHS  . brimonidine  1 drop Left Eye Daily   And  . timolol  1 drop Left Eye Daily  . calcitRIOL  0.25 mcg Oral Once per day on Mon Wed Fri  . calcium acetate  1,334 mg Oral TID WC  . febuxostat  40 mg Oral Daily  . feeding supplement  1 Container Oral TID BM   . folic acid  1 mg Oral Daily  . furosemide  80 mg Oral Daily  . hydroxychloroquine  300 mg Oral Daily  . insulin aspart  0-20  Units Subcutaneous TID WC  . insulin aspart  3 Units Subcutaneous TID WC  . insulin detemir  15 Units Subcutaneous QHS  . ipratropium-albuterol  3 mL Nebulization BID  . ketorolac  1 drop Left Eye Daily  . latanoprost  1 drop Left Eye QHS  . magic mouthwash  10 mL Oral TID  . mouth rinse  15 mL Mouth Rinse BID  . metoprolol tartrate  2.5 mg Intravenous Q6H  . pantoprazole  40 mg Oral Daily  . [START ON 11/15/2017] predniSONE  10 mg Oral Q breakfast  . [START ON 11/14/2017] predniSONE  20 mg Oral Q breakfast  . [START ON 11/13/2017] predniSONE  30 mg Oral Q breakfast  . sodium bicarbonate  650 mg Oral TID  . Warfarin - Pharmacist Dosing Inpatient   Does not apply Q24H   Continuous Infusions: . sodium chloride    . sodium chloride    . sodium chloride    . sodium chloride    . sodium chloride    . sodium chloride       LOS: 10 days    Time spent: 30 minutes    [name] Darleen Crocker, DO [facility] Pager [phone]  If 7PM-7AM, please contact night-coverage [email or web address] Password [facility] 11/12/2017, 4:36 PM

## 2017-11-12 NOTE — Care Management Note (Signed)
Case Management Note  Patient Details  Name: [name] MRN: [id number] Date of Birth: [birth year]  If discussed at Long Length of Stay Meetings, dates discussed: 11/12/17   Additional Comments:  Sherald Barge, RN 11/12/2017, 12:29 PM

## 2017-11-12 NOTE — Progress Notes (Signed)
Subjective: Interval History: Patient complains of dry mouth.  She denies any nausea or vomiting.  Denies also any difficulty breathing.  Objective: Vital signs in last 24 hours: Temp:  [98.6 F (37 C)-98.9 F (37.2 C)] 98.9 F (37.2 C) (02/13 2217) Pulse Rate:  [86-90] 90 (02/13 2217) Resp:  [18] 18 (02/13 1634) BP: (140-146)/(60) 146/60 (02/13 2217) SpO2:  [96 %-100 %] 96 % (02/14 0735) Weight:  [83.2 kg (183 lb 6.8 oz)] 83.2 kg (183 lb 6.8 oz) (02/14 0500) Weight change: -1.7 kg (-12 oz)  Intake/Output from previous day: 02/13 0701 - 02/14 0700 In: 200 [P.O.:200] Out: 450 [Urine:450] Intake/Output this shift: No intake/output data recorded.  General appearance: alert, cooperative and no distress Resp: diminished breath sounds bilaterally Cardio: regular rate and rhythm Extremities: No edema  Lab Results: Recent Labs    11/10/17 0507 11/11/17 0415  WBC 18.4* 16.1*  HGB 11.7* 11.2*  HCT 38.3 36.3  PLT 451* 409*   BMET:  Recent Labs    11/11/17 0415 11/12/17 0540  NA 136 136  K 3.8 3.8  CL 96* 95*  CO2 27 27  GLUCOSE 334* 347*  BUN 69* 92*  CREATININE 2.85* 3.53*  CALCIUM 8.3* 8.4*   No results for input(Little): PTH in the last 72 hours. Iron Studies:  No results for input(Little): IRON, TIBC, TRANSFERRIN, FERRITIN in the last 72 hours.  Studies/Results: No results found.  I have reviewed the patient'Little current medications.  Assessment/Plan: 1] chronic renal failure: Patient presently on dialysis.  She did not have any uremic signs and symptoms.  Potassium is normal. 2] difficulty breathing: Possibly a combination of COPD and fluid overload.  Presently she offers no complaints. 3] hypertension: Her blood pressure is reasonably controlled 4] diabetes: Blood sugar is reasonably controlled 5] history of lupus 6] bone mineral disorder: Her calcium and phosphorus is range. 7] anemia: Her hemoglobin remains within our target goal. Plan: 1] will make arrangement  for patient to get dialysis today. 2] we will check a renal panel in the morning. 3] patient could be discharged once outpatient dialysis is arranged at  [facility] outpatient dialysis unit.   LOS: 10 days   [name] 11/12/2017,10:06 AM

## 2017-11-12 NOTE — Progress Notes (Signed)
ANTICOAGULATION CONSULT NOTE - follow up  Pharmacy Consult for Warfarin (home med) Indication: atrial fibrillation  Allergies  Allergen Reactions  . Ace Inhibitors Cough   Patient Measurements: Height: 5\' 4"  (162.6 cm) Weight: 183 lb 13.8 oz (83.4 kg) IBW/kg (Calculated) : 54.7  Vital Signs: Temp: 98.8 F (37.1 C) (02/14 1025) Temp Source: Oral (02/14 1025) BP: 113/52 (02/14 1300) Pulse Rate: 80 (02/14 1300)  Labs: Recent Labs    11/10/17 0507 11/11/17 0415 11/12/17 0540  HGB 11.7* 11.2*  --   HCT 38.3 36.3  --   PLT 451* 409*  --   LABPROT 16.2* 17.9* 18.0*  INR 1.32 1.49 1.51  CREATININE 2.87* 2.85* 3.53*   Estimated Creatinine Clearance: 15.3 mL/min (A) (by C-G formula based on SCr of 3.53 mg/dL (H)).  Medical History: Past Medical History:  Diagnosis Date  . Anemia   . Arthritis   . Asthma   . Atrial fibrillation ([place])   . Atrial flutter [facility])    TEE/DCCV December 2013 - on Xarelto [facility])  . Bone spur of toe    Right 5th toe  . Chronic diastolic CHF (congestive heart failure) (HCC)    a. EF 50-55% by echo in 2015  . CKD (chronic kidney disease) stage 4, GFR 15-29 ml/min (HCC)    a. s/p fistula placement but not on HD. Followed by Nephrology.   Marland Kitchen COPD (chronic obstructive pulmonary disease) ([place])   . DVT of upper extremity (deep vein thrombosis) ([place])    Right basilic vein, June 4098  . Essential hypertension, benign   . Fractures   . SLE (systemic lupus erythematosus) ([place])   . Type 2 diabetes mellitus ([place])   . UTI (lower urinary tract infection)    Medications:  Medications Prior to Admission  Medication Sig Dispense Refill Last Dose  . acetaminophen (TYLENOL) 500 MG tablet Take 1,000 mg by mouth every 6 (six) hours as needed. For pain   Taking  . albuterol (ACCUNEB) 0.63 MG/3ML nebulizer solution Take 1 ampule by nebulization every 6 (six) hours as needed for wheezing or shortness of breath.    11/01/2017 at Unknown time  . albuterol (PROVENTIL  HFA;VENTOLIN HFA) 108 (90 BASE) MCG/ACT inhaler Inhale 2 puffs into the lungs every 6 (six) hours as needed. For shortness of breath   11/01/2017 at Unknown time  . amLODipine (NORVASC) 10 MG tablet Take 1 tablet (10 mg total) by mouth at bedtime. 30 tablet 0 11/02/2017 at Unknown time  . atorvastatin (LIPITOR) 40 MG tablet Take 40 mg at bedtime by mouth.    11/01/2017 at Unknown time  . brimonidine-timolol (COMBIGAN) 0.2-0.5 % ophthalmic solution Place 1 drop into the left eye daily.    11/01/2017 at Unknown time  . calcitRIOL (ROCALTROL) 0.25 MCG capsule Take 0.25 mcg by mouth as directed. Take on Mondays Wednesdays and Fridays   11/02/2017 at Unknown time  . diclofenac (VOLTAREN) 0.1 % ophthalmic solution Place 1 drop into the left eye daily. Uses at lunch time   11/01/2017 at Unknown time  . dicyclomine (BENTYL) 10 MG capsule Take 10 mg by mouth 4 (four) times daily -  before meals and at bedtime.   11/02/2017 at Unknown time  . febuxostat (ULORIC) 40 MG tablet Take 40 mg by mouth daily.   11/02/2017 at Unknown time  . folic acid (FOLVITE) 1 MG tablet Take 1 mg by mouth daily.   11/02/2017 at Unknown time  . furosemide (LASIX) 80 MG tablet Take 160 mg  by mouth 2 (two) times daily.    11/02/2017 at Unknown time  . HUMALOG KWIKPEN 100 UNIT/ML KiwkPen INJECT UP TO 11 UNITS SUBCUTANEOUSLY BEFORE MEALS THREE TIMES DAILY. 15 mL 2 11/02/2017 at Unknown time  . HYDROcodone-acetaminophen (NORCO/VICODIN) 5-325 MG tablet 1 or 2 tabs PO q12 hours prn pain 10 tablet 0 11/01/2017 at Unknown time  . hydroxychloroquine (PLAQUENIL) 200 MG tablet Take 200 mg by mouth as directed. Take 1 1/2 tablets daily   11/02/2017 at 900  . Insulin Detemir (LEVEMIR FLEXTOUCH) 100 UNIT/ML Pen Inject 10 Units into the skin daily at 10 pm. 5 pen 2 11/01/2017 at Unknown time  . metoprolol tartrate (LOPRESSOR) 50 MG tablet TAKE ONE TABLET BY MOUTH TWICE DAILY. 60 tablet 6 11/02/2017 at 900  . Multiple Vitamins-Minerals (CENTRUM SILVER PO) Take 1 tablet by mouth  daily.   11/02/2017 at Unknown time  . ONE TOUCH ULTRA TEST test strip TEST 4 TIMES A DAY AS DIRECTED. 150 each 5 11/02/2017 at Unknown time  . ONETOUCH DELICA LANCETS 16O MISC USE TO TEST 4 TIMES DAILY. 100 each 5 11/02/2017 at Unknown time  . predniSONE (DELTASONE) 5 MG tablet Take 5 mg by mouth daily with breakfast.   11/02/2017 at Unknown time  . travoprost, benzalkonium, (TRAVATAN) 0.004 % ophthalmic solution Place 1 drop into the left eye at bedtime.   11/01/2017 at Unknown time  . UNIFINE PENTIPS 31G X 6 MM MISC USE AS DIRECTED AT BEDTIME WITH LEVEMIR AND WITH SLIDING SCALE INSULIN. 150 each 5 11/02/2017 at Unknown time  . Vitamin D, Ergocalciferol, (DRISDOL) 50000 units CAPS capsule TAKE 1 CAPSULE BY MOUTH ONCE WEEKLY. 12 capsule 0 Past Week at Unknown time  . warfarin (COUMADIN) 5 MG tablet Take 1 tablet daily (Patient taking differently: Take 5 mg by mouth daily at 6 PM. Take 1 tablet daily) 180 tablet 3 11/01/2017 at 1800    Home dose = 5mg  daily per PTA med list.  Assessment: 72 yo woman admitted due to renal failure, acute on chronic. INR was supratherapeutic > 10 with no e/o bleeding. Vitamin K 5mg  po given x 1 on 2/7.  Coumadin on hold for placement of tunnel catheter at [facility] on 11/09/17 which was completed. INR remains below goal.   Goal of Therapy:  INR 2-3   Plan:  Coumadin 5mg  x 1 today Daily PT/INR Monitor for s/s of bleeding  Isac Sarna, BS Vena [place], BCPS Clinical Pharmacist Pager [phone] 11/12/2017 1:47 PM

## 2017-11-13 ENCOUNTER — Inpatient Hospital Stay
Admission: RE | Admit: 2017-11-13 | Discharge: 2017-11-21 | Disposition: A | Discharge status: Nursing Home (Includes ICF) | Payer: Medicare Other | Source: Ambulatory Visit | Attending: Internal Medicine | Admitting: Internal Medicine

## 2017-11-13 DIAGNOSIS — E1122 Type 2 diabetes mellitus with diabetic chronic kidney disease: Secondary | ICD-10-CM | POA: Diagnosis not present

## 2017-11-13 DIAGNOSIS — E1140 Type 2 diabetes mellitus with diabetic neuropathy, unspecified: Secondary | ICD-10-CM | POA: Diagnosis not present

## 2017-11-13 DIAGNOSIS — Z87891 Personal history of nicotine dependence: Secondary | ICD-10-CM | POA: Diagnosis not present

## 2017-11-13 DIAGNOSIS — T148XXA Other injury of unspecified body region, initial encounter: Secondary | ICD-10-CM | POA: Diagnosis not present

## 2017-11-13 DIAGNOSIS — J9611 Chronic respiratory failure with hypoxia: Secondary | ICD-10-CM | POA: Diagnosis not present

## 2017-11-13 DIAGNOSIS — Z23 Encounter for immunization: Secondary | ICD-10-CM | POA: Diagnosis not present

## 2017-11-13 DIAGNOSIS — M1990 Unspecified osteoarthritis, unspecified site: Secondary | ICD-10-CM | POA: Diagnosis not present

## 2017-11-13 DIAGNOSIS — Y92128 Other place in nursing home as the place of occurrence of the external cause: Secondary | ICD-10-CM | POA: Diagnosis not present

## 2017-11-13 DIAGNOSIS — N2581 Secondary hyperparathyroidism of renal origin: Secondary | ICD-10-CM | POA: Diagnosis not present

## 2017-11-13 DIAGNOSIS — Z978 Presence of other specified devices: Secondary | ICD-10-CM | POA: Diagnosis not present

## 2017-11-13 DIAGNOSIS — M3214 Glomerular disease in systemic lupus erythematosus: Secondary | ICD-10-CM | POA: Diagnosis not present

## 2017-11-13 DIAGNOSIS — E785 Hyperlipidemia, unspecified: Secondary | ICD-10-CM | POA: Diagnosis not present

## 2017-11-13 DIAGNOSIS — N186 End stage renal disease: Secondary | ICD-10-CM | POA: Diagnosis not present

## 2017-11-13 DIAGNOSIS — S40022A Contusion of left upper arm, initial encounter: Secondary | ICD-10-CM | POA: Diagnosis not present

## 2017-11-13 DIAGNOSIS — N031 Chronic nephritic syndrome with focal and segmental glomerular lesions: Secondary | ICD-10-CM | POA: Diagnosis not present

## 2017-11-13 DIAGNOSIS — Z992 Dependence on renal dialysis: Secondary | ICD-10-CM | POA: Diagnosis not present

## 2017-11-13 DIAGNOSIS — D508 Other iron deficiency anemias: Secondary | ICD-10-CM | POA: Diagnosis not present

## 2017-11-13 DIAGNOSIS — D631 Anemia in chronic kidney disease: Secondary | ICD-10-CM | POA: Diagnosis not present

## 2017-11-13 DIAGNOSIS — E876 Hypokalemia: Secondary | ICD-10-CM | POA: Diagnosis not present

## 2017-11-13 DIAGNOSIS — I4891 Unspecified atrial fibrillation: Secondary | ICD-10-CM | POA: Diagnosis not present

## 2017-11-13 DIAGNOSIS — I4892 Unspecified atrial flutter: Secondary | ICD-10-CM | POA: Diagnosis not present

## 2017-11-13 DIAGNOSIS — I10 Essential (primary) hypertension: Secondary | ICD-10-CM | POA: Diagnosis not present

## 2017-11-13 DIAGNOSIS — N939 Abnormal uterine and vaginal bleeding, unspecified: Secondary | ICD-10-CM | POA: Diagnosis not present

## 2017-11-13 DIAGNOSIS — N184 Chronic kidney disease, stage 4 (severe): Secondary | ICD-10-CM | POA: Diagnosis not present

## 2017-11-13 DIAGNOSIS — I5032 Chronic diastolic (congestive) heart failure: Secondary | ICD-10-CM | POA: Diagnosis not present

## 2017-11-13 DIAGNOSIS — D509 Iron deficiency anemia, unspecified: Secondary | ICD-10-CM | POA: Diagnosis not present

## 2017-11-13 DIAGNOSIS — X58XXXA Exposure to other specified factors, initial encounter: Secondary | ICD-10-CM | POA: Diagnosis not present

## 2017-11-13 DIAGNOSIS — D649 Anemia, unspecified: Secondary | ICD-10-CM | POA: Diagnosis not present

## 2017-11-13 DIAGNOSIS — R262 Difficulty in walking, not elsewhere classified: Secondary | ICD-10-CM | POA: Diagnosis not present

## 2017-11-13 DIAGNOSIS — M6281 Muscle weakness (generalized): Secondary | ICD-10-CM | POA: Diagnosis not present

## 2017-11-13 DIAGNOSIS — L03115 Cellulitis of right lower limb: Secondary | ICD-10-CM | POA: Diagnosis not present

## 2017-11-13 DIAGNOSIS — M3210 Systemic lupus erythematosus, organ or system involvement unspecified: Secondary | ICD-10-CM | POA: Diagnosis not present

## 2017-11-13 DIAGNOSIS — Y999 Unspecified external cause status: Secondary | ICD-10-CM | POA: Diagnosis not present

## 2017-11-13 DIAGNOSIS — R1312 Dysphagia, oropharyngeal phase: Secondary | ICD-10-CM | POA: Diagnosis not present

## 2017-11-13 DIAGNOSIS — J439 Emphysema, unspecified: Secondary | ICD-10-CM | POA: Diagnosis not present

## 2017-11-13 DIAGNOSIS — R195 Other fecal abnormalities: Secondary | ICD-10-CM | POA: Diagnosis not present

## 2017-11-13 DIAGNOSIS — Z794 Long term (current) use of insulin: Secondary | ICD-10-CM | POA: Diagnosis not present

## 2017-11-13 DIAGNOSIS — N179 Acute kidney failure, unspecified: Secondary | ICD-10-CM | POA: Diagnosis not present

## 2017-11-13 DIAGNOSIS — I872 Venous insufficiency (chronic) (peripheral): Secondary | ICD-10-CM | POA: Diagnosis not present

## 2017-11-13 DIAGNOSIS — R1031 Right lower quadrant pain: Secondary | ICD-10-CM | POA: Diagnosis not present

## 2017-11-13 DIAGNOSIS — Y93E1 Activity, personal bathing and showering: Secondary | ICD-10-CM | POA: Diagnosis not present

## 2017-11-13 DIAGNOSIS — R112 Nausea with vomiting, unspecified: Secondary | ICD-10-CM | POA: Diagnosis not present

## 2017-11-13 DIAGNOSIS — I132 Hypertensive heart and chronic kidney disease with heart failure and with stage 5 chronic kidney disease, or end stage renal disease: Secondary | ICD-10-CM | POA: Diagnosis not present

## 2017-11-13 DIAGNOSIS — E1129 Type 2 diabetes mellitus with other diabetic kidney complication: Secondary | ICD-10-CM | POA: Diagnosis not present

## 2017-11-13 DIAGNOSIS — I509 Heart failure, unspecified: Secondary | ICD-10-CM | POA: Diagnosis not present

## 2017-11-13 DIAGNOSIS — R109 Unspecified abdominal pain: Secondary | ICD-10-CM | POA: Diagnosis not present

## 2017-11-13 DIAGNOSIS — D689 Coagulation defect, unspecified: Secondary | ICD-10-CM | POA: Diagnosis not present

## 2017-11-13 DIAGNOSIS — J449 Chronic obstructive pulmonary disease, unspecified: Secondary | ICD-10-CM | POA: Diagnosis not present

## 2017-11-13 DIAGNOSIS — L02412 Cutaneous abscess of left axilla: Secondary | ICD-10-CM | POA: Diagnosis present

## 2017-11-13 DIAGNOSIS — E039 Hypothyroidism, unspecified: Secondary | ICD-10-CM | POA: Diagnosis not present

## 2017-11-13 DIAGNOSIS — J441 Chronic obstructive pulmonary disease with (acute) exacerbation: Secondary | ICD-10-CM | POA: Diagnosis not present

## 2017-11-13 LAB — BASIC METABOLIC PANEL
Anion gap: 11 (ref 5–15)
BUN: 60 mg/dL — ABNORMAL HIGH (ref 6–20)
CALCIUM: 8.3 mg/dL — AB (ref 8.9–10.3)
CO2: 29 mmol/L (ref 22–32)
CREATININE: 3.13 mg/dL — AB (ref 0.44–1.00)
Chloride: 96 mmol/L — ABNORMAL LOW (ref 101–111)
GFR calc Af Amer: 16 mL/min — ABNORMAL LOW (ref >60)
GFR calc non Af Amer: 14 mL/min — ABNORMAL LOW (ref >60)
GLUCOSE: 171 mg/dL — AB (ref 65–99)
Potassium: 3.7 mmol/L (ref 3.5–5.1)
Sodium: 136 mmol/L (ref 135–145)

## 2017-11-13 LAB — CBC
HEMATOCRIT: 36.8 % (ref 36.0–46.0)
Hemoglobin: 11.2 g/dL — ABNORMAL LOW (ref 12.0–15.0)
MCH: 27.3 pg (ref 26.0–34.0)
MCHC: 30.4 g/dL (ref 30.0–36.0)
MCV: 89.5 fL (ref 78.0–100.0)
Platelets: 355 10*3/uL (ref 150–400)
RBC: 4.11 MIL/uL (ref 3.87–5.11)
RDW: 18.2 % — AB (ref 11.5–15.5)
WBC: 17.1 10*3/uL — ABNORMAL HIGH (ref 4.0–10.5)

## 2017-11-13 LAB — GLUCOSE, CAPILLARY
GLUCOSE-CAPILLARY: 131 mg/dL — AB (ref 65–99)
Glucose-Capillary: 154 mg/dL — ABNORMAL HIGH (ref 65–99)
Glucose-Capillary: 168 mg/dL — ABNORMAL HIGH (ref 65–99)

## 2017-11-13 LAB — PROTIME-INR
INR: 1.84
Prothrombin Time: 21.1 seconds — ABNORMAL HIGH (ref 11.4–15.2)

## 2017-11-13 MED ORDER — WARFARIN SODIUM 5 MG PO TABS
5.0000 mg | ORAL_TABLET | Freq: Once | ORAL | Status: AC
  Administered 2017-11-13: 5 mg via ORAL
  Filled 2017-11-13: qty 1

## 2017-11-13 MED ORDER — TIMOLOL MALEATE 0.5 % OP SOLN: 1.0000 [drp] | Freq: Every day | OPHTHALMIC | 12 refills | Status: DC

## 2017-11-13 MED ORDER — CALCIUM ACETATE (PHOS BINDER) 667 MG PO CAPS: 1334.0000 mg | ORAL_CAPSULE | Freq: Three times a day (TID) | ORAL | 0 refills | Status: DC

## 2017-11-13 MED ORDER — SODIUM CHLORIDE 0.9 % IV SOLN: 100.0000 mL | INTRAVENOUS | Status: DC | PRN

## 2017-11-13 MED ORDER — BRIMONIDINE TARTRATE 0.2 % OP SOLN: 1.0000 [drp] | Freq: Every day | OPHTHALMIC | 12 refills | Status: DC

## 2017-11-13 NOTE — Progress Notes (Signed)
    HEMODIALYSIS NURSING NOTE:   Phone call received from [place] with [employer] on [employer].  Pt will start outpatient HD tomorrow, Saturday 11/14/17.  Labs and treatment records were reviewed.  Rockwell Alexandria, RN, CDN

## 2017-11-13 NOTE — Clinical Social Work Note (Signed)
10:27  Voicemail message left for Jasmine at [employer] requesting return call ASAP with confirmation of pt's outpt HD chair time so that pt can dc to her SNF. Will follow.

## 2017-11-13 NOTE — Care Management Important Message (Signed)
Important Message  Patient Details  Name: [name] MRN: [id number] Date of Birth: Feb 10, 1946   Medicare Important Message Given:  Yes    Sherald Barge, RN 11/13/2017, 12:27 PM

## 2017-11-13 NOTE — Clinical Social Work Placement (Signed)
   CLINICAL SOCIAL WORK PLACEMENT  NOTE  Date:  11/13/2017  Patient Details  Name: [name] MRN: [id number] Date of Birth: 01-18-1946  Clinical Social Work is seeking post-discharge placement for this patient at the [place] level of care (*CSW will initial, date and re-position this form in  chart as items are completed):  Yes   Patient/family provided with [place] Work Department's list of facilities offering this level of care within the geographic area requested by the patient (or if unable, by the patient's family).  Yes   Patient/family informed of their freedom to choose among providers that offer the needed level of care, that participate in Medicare, Medicaid or managed care program needed by the patient, have an available bed and are willing to accept the patient.  Yes   Patient/family informed of [facility]'s ownership interest in [facility] and [facility], as well as of the fact that they are under no obligation to receive care at these facilities.  PASRR submitted to EDS on 11/06/17     PASRR number received on 11/06/17     Existing PASRR number confirmed on       FL2 transmitted to all facilities in geographic area requested by pt/family on       FL2 transmitted to all facilities within larger geographic area on       Patient informed that his/her managed care company has contracts with or will negotiate with certain facilities, including the following:        Yes   Patient/family informed of bed offers received.  Patient chooses bed at [facility]     Physician recommends and patient chooses bed at      Patient to be transferred to [facility] on 11/13/17.  Patient to be transferred to facility by [facility] Staff      Patient family notified on 11/13/17 of transfer.  Name of family member notified:  Mr. Gaumond     PHYSICIAN       Additional Comment: Pt's outpt HD schedule confirmed.  Time is 11:45 TRS. Pt will transfer to [facility] today. Kerri at [facility] states that they worked with [employer] to get transportation covered. [employer] will pay for 18 transports to HD. Husband updated. He is hoping she will be strong enough after that so he can transport her himself. DC clinical sent through the [place]. Pt's RN updated. No other SW needs for dc.   _______________________________________________ Shade Flood, LCSW 11/13/2017, 4:46 PM

## 2017-11-13 NOTE — Progress Notes (Signed)
Subjective: Interval History: Patient feels better.  Still she complains of some pain of her mouth.  She denies any difficulty with swallowing.  Denies also any nausea or vomiting.  Objective: Vital signs in last 24 hours: Temp:  [98.2 F (36.8 C)-98.9 F (37.2 C)] 98.9 F (37.2 C) (02/15 0531) Pulse Rate:  [80-100] 86 (02/15 0531) Resp:  [16-20] 18 (02/15 0531) BP: (113-199)/(46-84) 166/47 (02/15 0531) SpO2:  [93 %-100 %] 93 % (02/15 0740) Weight:  [82.3 kg (181 lb 7 oz)-83.4 kg (183 lb 13.8 oz)] 83 kg (182 lb 15.7 oz) (02/15 0531) Weight change: 0.2 kg (7.1 oz)  Intake/Output from previous day: 02/14 0701 - 02/15 0700 In: 360 [P.O.:360] Out: 1000  Intake/Output this shift: No intake/output data recorded.  General appearance: alert, cooperative and no distress Resp: diminished breath sounds bilaterally Cardio: regular rate and rhythm Extremities: No edema  Lab Results: Recent Labs    11/11/17 0415 11/13/17 0454  WBC 16.1* 17.1*  HGB 11.2* 11.2*  HCT 36.3 36.8  PLT 409* 355   BMET:  Recent Labs    11/12/17 0540 11/13/17 0454  NA 136 136  K 3.8 3.7  CL 95* 96*  CO2 27 29  GLUCOSE 347* 171*  BUN 92* 60*  CREATININE 3.53* 3.13*  CALCIUM 8.4* 8.3*   No results for input(s): PTH in the last 72 hours. Iron Studies:  No results for input(s): IRON, TIBC, TRANSFERRIN, FERRITIN in the last 72 hours.  Studies/Results: No results found.  I have reviewed the patient's current medications.  Assessment/Plan: 1] chronic renal failure: She is status post hemodialysis yesterday.  Patient denies any nausea or vomiting.  Her potassium is normal. 2] difficulty in  breathing: Possibly a combination of COPD and fluid overload.  Patient is feeling much better.  She has occasional cough but no sputum production. 3] hypertension: Her blood pressure is reasonably controlled 4] diabetes: Blood sugar is reasonably controlled 5] history of lupus 6] bone mineral disorder: Her  calcium and phosphorus is range. 7] anemia: Her hemoglobin remains within our target goal. Plan: 1] patient does not require dialysis today.  We will dialyze her tomorrow. 2] we will check a renal panel in the morning.    LOS: 11 days   Bolton Canupp S 11/13/2017,8:09 AM

## 2017-11-13 NOTE — Progress Notes (Signed)
SLP Cancellation Note  Patient Details Name: [name] MRN: [id number] DOB: 1946/06/12   Cancelled treatment: Pt's diet has been advanced to regular and reports no apparent difficulty or s/s of aspiration. Will sign off; please re-consult if needs arise.          Kern Reap, MA, CCC-SLP 11/13/2017, 5:34 PM

## 2017-11-13 NOTE — Discharge Summary (Signed)
Physician Discharge Summary  [name] QRF:[id number] DOB: [birth year] DOA: 11/02/2017  PCP: Sharilyn Sites, MD  Admit date: 11/02/2017  Discharge date: 11/13/2017  Admitted From:SNF  Disposition:  SNF  Recommendations for Outpatient Follow-up:  1. Follow up with PCP in 1-2 weeks  Home Health:N/A  Equipment/Devices:N/A  Discharge Condition:Stable  CODE STATUS: Full  Diet recommendation: Heart Healthy  Brief/Interim Summary:  This is a 72 year old female who was admitted on 2/4 with worsening chronic abdominal pain along with nausea and vomiting.  She was also noted to have a nonproductive cough and low-grade fevers.  She was noted to be hypoxemic with worsening renal failure and on 2/7 INR was found to be greater than 10.  She was scheduled to start dialysis on 2/8 however hemodialysis nurse was unable to access her left upper arm AV fistula and therefore a tunneled dialysis catheter was placed on 2/11 with a short dialysis session that day and once again on 2/12. She has had repeated HD on 2/14 with 1L of fluid removed.  She is currently stable for discharge to skilled nursing facility with hemodialysis chair which has been arranged for her.   Discharge Diagnoses:  Principal Problem:   Renal failure (ARF), acute on chronic (HCC) Active Problems:   Atrial flutter (HCC)   SLE (systemic lupus erythematosus) (HCC)   Essential hypertension, benign   COPD (chronic obstructive pulmonary disease) (HCC)   Anemia of chronic renal failure, stage 4 (severe) (HCC)   Acute respiratory failure with hypoxia (HCC)   Chronic diastolic heart failure (HCC)   Focal segmental glomerulosclerosis without nephrosis or chronic glomerulonephritis   Mixed hyperlipidemia   Type 2 diabetes mellitus with stage 4 chronic kidney disease (HCC)   Iron deficiency anemia   Community acquired pneumonia   Nausea and vomiting   AKI (acute kidney injury) ([place])   Acute renal failure superimposed on stage 4  chronic kidney disease (HCC)   COPD with acute exacerbation ([place])   1. Acute hypoxemic respiratory failure secondary to acute COPD exacerbation with presumed aspiration pneumonia and respiratory virus panel positive for RSV-resolved.  This has now resolved and she is status post 7 days of Zosyn with no plans for further antibiotic therapy plan at this time. 2. Oral thrush. Magic mouthwash and nystatin swish and swallow. This has resolved. 3. Acute COPD exacerbation-resolved.  Continue duo nebs as needed as well as steroid titration. Continue on prednisone 19m for now. 4. End-stage renal disease now on hemodialysis.  Appreciate nephrology recommendations for dialysis and continuation of oral bicarb tablets.  She is currently awaiting outpatient dialysis slot. 5. UTI with completed treatment on 7 days of Zosyn. 6. Viral gastroenteritis-resolved. 7. Chronic diastolic heart failure with no acute component. 8. Paroxysmal atrial fibrillation.  Currently in sinus rhythm with resumption of warfarin after dialysis catheter placement with pharmacy to manage INR. 9. Systemic lupus.  Continue Plaquenil. 10. Essential hypertension.  Continue to monitor as this currently appears well-controlled. Type 2 diabetes. Continue prior regimen.   Discharge Instructions  Discharge Instructions    Diet - low sodium heart healthy   Complete by:  As directed    Increase activity slowly   Complete by:  As directed      Allergies as of 11/13/2017      Reactions   Ace Inhibitors Cough      Medication List    TAKE these medications   acetaminophen 500 MG tablet Commonly known as:  TYLENOL Take 1,000 mg by mouth every  6 (six) hours as needed. For pain   albuterol 0.63 MG/3ML nebulizer solution Commonly known as:  ACCUNEB Take 1 ampule by nebulization every 6 (six) hours as needed for wheezing or shortness of breath.   albuterol 108 (90 Base) MCG/ACT inhaler Commonly known as:  PROVENTIL HFA;VENTOLIN  HFA Inhale 2 puffs into the lungs every 6 (six) hours as needed. For shortness of breath   amLODipine 10 MG tablet Commonly known as:  NORVASC Take 1 tablet (10 mg total) by mouth at bedtime.   atorvastatin 40 MG tablet Commonly known as:  LIPITOR Take 40 mg at bedtime by mouth.   brimonidine 0.2 % ophthalmic solution Commonly known as:  ALPHAGAN Place 1 drop into the left eye daily. Start taking on:  11/14/2017   calcitRIOL 0.25 MCG capsule Commonly known as:  ROCALTROL Take 0.25 mcg by mouth as directed. Take on Mondays Wednesdays and Fridays   calcium acetate 667 MG capsule Commonly known as:  PHOSLO Take 2 capsules (1,334 mg total) by mouth 3 (three) times daily with meals.   CENTRUM SILVER PO Take 1 tablet by mouth daily.   COMBIGAN 0.2-0.5 % ophthalmic solution Generic drug:  brimonidine-timolol Place 1 drop into the left eye daily.   diclofenac 0.1 % ophthalmic solution Commonly known as:  VOLTAREN Place 1 drop into the left eye daily. Uses at lunch time   dicyclomine 10 MG capsule Commonly known as:  BENTYL Take 10 mg by mouth 4 (four) times daily -  before meals and at bedtime.   febuxostat 40 MG tablet Commonly known as:  ULORIC Take 40 mg by mouth daily.   folic acid 1 MG tablet Commonly known as:  FOLVITE Take 1 mg by mouth daily.   furosemide 80 MG tablet Commonly known as:  LASIX Take 160 mg by mouth 2 (two) times daily.   HUMALOG KWIKPEN 100 UNIT/ML KiwkPen Generic drug:  insulin lispro INJECT UP TO 11 UNITS SUBCUTANEOUSLY BEFORE MEALS THREE TIMES DAILY.   HYDROcodone-acetaminophen 5-325 MG tablet Commonly known as:  NORCO/VICODIN 1 or 2 tabs PO q12 hours prn pain   hydroxychloroquine 200 MG tablet Commonly known as:  PLAQUENIL Take 200 mg by mouth as directed. Take 1 1/2 tablets daily   Insulin Detemir 100 UNIT/ML Pen Commonly known as:  LEVEMIR FLEXTOUCH Inject 10 Units into the skin daily at 10 pm.   metoprolol tartrate 50 MG  tablet Commonly known as:  LOPRESSOR TAKE ONE TABLET BY MOUTH TWICE DAILY.   ONE TOUCH ULTRA TEST test strip Generic drug:  glucose blood TEST 4 TIMES A DAY AS DIRECTED.   ONETOUCH DELICA LANCETS 47W Misc USE TO TEST 4 TIMES DAILY.   predniSONE 5 MG tablet Commonly known as:  DELTASONE Take 5 mg by mouth daily with breakfast.   timolol 0.5 % ophthalmic solution Commonly known as:  TIMOPTIC Place 1 drop into the left eye daily. Start taking on:  11/14/2017   travoprost (benzalkonium) 0.004 % ophthalmic solution Commonly known as:  TRAVATAN Place 1 drop into the left eye at bedtime.   UNIFINE PENTIPS 31G X 6 MM Misc Generic drug:  Insulin Pen Needle USE AS DIRECTED AT BEDTIME WITH LEVEMIR AND WITH SLIDING SCALE INSULIN.   Vitamin D (Ergocalciferol) 50000 units Caps capsule Commonly known as:  DRISDOL TAKE 1 CAPSULE BY MOUTH ONCE WEEKLY.   warfarin 5 MG tablet Commonly known as:  COUMADIN Take as directed. If you are unsure how to take this medication, talk to your  nurse or doctor. Original instructions:  Take 1 tablet daily What changed:    how much to take  how to take this  when to take this  additional instructions      Contact information for after-discharge care    [facility] .   Service:  Skilled Nursing Contact information: [address] [place] [address] [phone]             Allergies  Allergen Reactions  . Ace Inhibitors Cough    Consultations:  Nephrology   Procedures/Studies: Ct Abdomen Pelvis Wo Contrast  Result Date: 11/02/2017 CLINICAL DATA:  Emesis for the last 3 days.  Intermittent fever. EXAM: CT ABDOMEN AND PELVIS WITHOUT CONTRAST TECHNIQUE: Multidetector CT imaging of the abdomen and pelvis was performed following the standard protocol without IV contrast. COMPARISON:  09/09/2017.  08/31/2017. FINDINGS: Lower chest: There is a small left effusion. There is segmental  consolidation in the left lower lobe suggesting pneumonia. The heart is mildly enlarged. Hepatobiliary: No liver parenchymal abnormality is seen. There is some layering sludge or stones in the gallbladder. No CT evidence of cholecystitis. Pancreas: Normal Spleen: Normal Adrenals/Urinary Tract: Adrenal glands are normal. Kidneys show vascular calcifications but no acute finding. Bladder appears normal. Stomach/Bowel: No acute bowel pathology is seen. No sign of obstruction or inflammatory disease. Vascular/Lymphatic: Advanced aortic atherosclerosis. Atherosclerosis of the aortic branch vessels. IVC is normal. No retroperitoneal adenopathy. Reproductive: Previous hysterectomy.  No pelvic mass. Other: No free fluid or air. Musculoskeletal: Chronic lumbar degenerative changes. No acute bone finding. IMPRESSION: Segmental consolidation in the left lower lobe consistent with bronchopneumonia. Small amount of left pleural fluid. Sludge or gallstones in the gallbladder. Aortic atherosclerosis. No acute bowel pathology seen. No obstruction or inflammatory finding. Electronically Signed   By: Nelson Chimes M.D.   On: 11/02/2017 18:28   Dg Tibia/fibula Right  Result Date: 11/02/2017 CLINICAL DATA:  Right lower leg ulcers. EXAM: RIGHT TIBIA AND FIBULA - 2 VIEW COMPARISON:  None. FINDINGS: There is no evidence of fracture or other focal bone lesions. Surgical screw is seen in distal right fibula. Vascular calcifications are noted. No lytic destruction is seen to suggest osteomyelitis. IMPRESSION: No fracture or dislocation is noted. No definite evidence of osteomyelitis. Electronically Signed   By: Marijo Conception, M.D.   On: 11/02/2017 17:30   US Abdomen Complete  Result Date: 11/03/2017 CLINICAL DATA:  Emesis for 3 days and intermittent fever. Chronic abdominal pain. EXAM: ABDOMEN ULTRASOUND COMPLETE COMPARISON:  Abdominal CT 11/02/2017 FINDINGS: Gallbladder: Small amount of echogenic sludge within the gallbladder. No  gallbladder wall distention or wall thickening. Reportedly, the patient does not have a sonographic Murphy sign. No gallstones. Common bile duct: Diameter: 0.4 cm. Liver: No focal lesion identified. Within normal limits in parenchymal echogenicity. Portal vein is patent on color Doppler imaging with normal direction of blood flow towards the liver. IVC: No abnormality visualized. Pancreas: Visualized portion unremarkable. Spleen: Size and appearance within normal limits. Right Kidney: Length: 11.6 cm. Increased echogenicity in the right kidney without hydronephrosis. There is a indeterminate hypoechoic structure along the lateral upper pole region. This structure measures 2.6 x 2.4 x 2.8 cm. Structure may be partially cystic but cannot exclude a solid component. Review of the prior non contrast CT demonstrates minimal fullness in this area but there is not an obvious lesion based on the non contrast images. Left Kidney: Length: 10.1 cm. Echogenicity within normal limits. No mass  or hydronephrosis visualized. Abdominal aorta: No aneurysm visualized. Other findings: None. IMPRESSION: Indeterminate right renal lesion measuring up to 2.8 cm. This could represent a complex cystic lesion but cannot exclude a solid component. This lesion is not well characterized on the prior non contrast CTs. Patient is not a candidate for contrast enhanced studies due to renal function but this lesion could be further characterized with a non contrast renal MRI. Gallbladder sludge without stones or biliary dilatation. Electronically Signed   By: Markus Daft M.D.   On: 11/03/2017 11:50   Ir Fluoro Guide Cv Line Right  Result Date: 11/09/2017 INDICATION: Acute on chronic renal failure. Please place tunneled dialysis catheter for the initiation of dialysis. EXAM: TUNNELED CENTRAL VENOUS HEMODIALYSIS CATHETER PLACEMENT WITH ULTRASOUND AND FLUOROSCOPIC GUIDANCE MEDICATIONS: Ancef 2 gm IV . The antibiotic was given in an appropriate time  interval prior to skin puncture. ANESTHESIA/SEDATION: Versed 1.5 mg IV; Fentanyl 50 mcg IV; Moderate Sedation Time: 13 minutes The patient was continuously monitored during the procedure by the interventional radiology nurse under my direct supervision. FLUOROSCOPY TIME:  Fluoroscopy Time: 30 seconds (3 mGy). COMPLICATIONS: None immediate. PROCEDURE: Informed written consent was obtained from the patient after a discussion of the risks, benefits, and alternatives to treatment. Questions regarding the procedure were encouraged and answered. The right neck and chest were prepped with chlorhexidine in a sterile fashion, and a sterile drape was applied covering the operative field. Maximum barrier sterile technique with sterile gowns and gloves were used for the procedure. A timeout was performed prior to the initiation of the procedure. After creating a small venotomy incision, a micropuncture kit was utilized to access the internal jugular vein. Real-time ultrasound guidance was utilized for vascular access including the acquisition of a permanent ultrasound image documenting patency of the accessed vessel. The microwire was utilized to measure appropriate catheter length. A stiff Glidewire was advanced to the level of the IVC and the micropuncture sheath was exchanged for a peel-away sheath. A palindrome tunneled hemodialysis catheter measuring 19 cm from tip to cuff was tunneled in a retrograde fashion from the anterior chest wall to the venotomy incision. The catheter was then placed through the peel-away sheath with tips ultimately positioned within the superior aspect of the right atrium. Final catheter positioning was confirmed and documented with a spot radiographic image. The catheter aspirates and flushes normally. The catheter was flushed with appropriate volume heparin dwells. The catheter exit site was secured with a 0-Prolene retention suture. The venotomy incision was closed with an interrupted 4-0  Vicryl, Dermabond and Steri-strips. Dressings were applied. The patient tolerated the procedure well without immediate post procedural complication. IMPRESSION: Successful placement of 19 cm tip to cuff tunneled hemodialysis catheter via the right internal jugular vein with tips terminating within the superior aspect of the right atrium. The catheter is ready for immediate use. Electronically Signed   By: Sandi Mariscal M.D.   On: 11/09/2017 14:01   Ir US Guide Vasc Access Right  Result Date: 11/09/2017 INDICATION: Acute on chronic renal failure. Please place tunneled dialysis catheter for the initiation of dialysis. EXAM: TUNNELED CENTRAL VENOUS HEMODIALYSIS CATHETER PLACEMENT WITH ULTRASOUND AND FLUOROSCOPIC GUIDANCE MEDICATIONS: Ancef 2 gm IV . The antibiotic was given in an appropriate time interval prior to skin puncture. ANESTHESIA/SEDATION: Versed 1.5 mg IV; Fentanyl 50 mcg IV; Moderate Sedation Time: 13 minutes The patient was continuously monitored during the procedure by the interventional radiology nurse under my direct supervision. FLUOROSCOPY TIME:  Fluoroscopy Time:  30 seconds (3 mGy). COMPLICATIONS: None immediate. PROCEDURE: Informed written consent was obtained from the patient after a discussion of the risks, benefits, and alternatives to treatment. Questions regarding the procedure were encouraged and answered. The right neck and chest were prepped with chlorhexidine in a sterile fashion, and a sterile drape was applied covering the operative field. Maximum barrier sterile technique with sterile gowns and gloves were used for the procedure. A timeout was performed prior to the initiation of the procedure. After creating a small venotomy incision, a micropuncture kit was utilized to access the internal jugular vein. Real-time ultrasound guidance was utilized for vascular access including the acquisition of a permanent ultrasound image documenting patency of the accessed vessel. The microwire was  utilized to measure appropriate catheter length. A stiff Glidewire was advanced to the level of the IVC and the micropuncture sheath was exchanged for a peel-away sheath. A palindrome tunneled hemodialysis catheter measuring 19 cm from tip to cuff was tunneled in a retrograde fashion from the anterior chest wall to the venotomy incision. The catheter was then placed through the peel-away sheath with tips ultimately positioned within the superior aspect of the right atrium. Final catheter positioning was confirmed and documented with a spot radiographic image. The catheter aspirates and flushes normally. The catheter was flushed with appropriate volume heparin dwells. The catheter exit site was secured with a 0-Prolene retention suture. The venotomy incision was closed with an interrupted 4-0 Vicryl, Dermabond and Steri-strips. Dressings were applied. The patient tolerated the procedure well without immediate post procedural complication. IMPRESSION: Successful placement of 19 cm tip to cuff tunneled hemodialysis catheter via the right internal jugular vein with tips terminating within the superior aspect of the right atrium. The catheter is ready for immediate use. Electronically Signed   By: Sandi Mariscal M.D.   On: 11/09/2017 14:01   Dg Chest Port 1 View  Result Date: 11/03/2017 CLINICAL DATA:  Respiratory failure EXAM: PORTABLE CHEST 1 VIEW COMPARISON:  November 12, 2017 FINDINGS: There is stable cardiomegaly with mild pulmonary venous hypertension. There is atelectatic change in the left base. No frank edema or consolidation evident. There is aortic atherosclerosis. No bone lesions appreciable. IMPRESSION: Evidence of a degree of pulmonary vascular congestion without frank edema or consolidation. There is left base atelectasis. There is aortic atherosclerosis. Aortic Atherosclerosis (ICD10-I70.0). Electronically Signed   By: Lowella Grip III M.D.   On: 11/03/2017 08:37   Dg Chest Port 1 View  Result  Date: 11/02/2017 CLINICAL DATA:  Shortness of breath and cough. EXAM: PORTABLE CHEST 1 VIEW COMPARISON:  August 13, 2017 FINDINGS: Stable cardiomegaly. Mild edema. No other interval changes or acute abnormalities. IMPRESSION: Cardiomegaly and mild edema. Electronically Signed   By: Dorise Bullion III M.D   On: 11/02/2017 19:11    Discharge Exam: Vitals:   11/13/17 0740 11/13/17 1300  BP:  139/69  Pulse:  81  Resp:  19  Temp:  98.3 F (36.8 C)  SpO2: 93% 98%   Vitals:   11/12/17 2215 11/13/17 0531 11/13/17 0740 11/13/17 1300  BP: (!) 166/46 (!) 166/47  139/69  Pulse: 86 86  81  Resp: _0 Temp: 98.6 F (37 C) 98.9 F (37.2 C)  98.3 F (36.8 C)  TempSrc: Oral Oral  Oral  SpO2: 100% 100% 93% 98%  Weight:  83 kg (182 lb 15.7 oz)    Height:        General: Pt is alert, awake, not in acute  distress Cardiovascular: RRR, S1/S2 +, no rubs, no gallops Respiratory: CTA bilaterally, no wheezing, no rhonchi Abdominal: Soft, NT, ND, bowel sounds + Extremities: no edema, no cyanosis    The results of significant diagnostics from this hospitalization (including imaging, microbiology, ancillary and laboratory) are listed below for reference.     Microbiology: No results found for this or any previous visit (from the past 240 hour(s)).   Labs: BNP (last 3 results) Recent Labs    11/03/17 0801  BNP 7,672.0*   Basic Metabolic Panel: Recent Labs  Lab 11/07/17 0647 11/08/17 0544 11/09/17 0459 11/10/17 0507 11/11/17 0415 11/12/17 0540 11/13/17 0454  NA 138 139 138 141 136 136 136  K 4.2 3.4* 4.0 3.9 3.8 3.8 3.7  CL 101 101 100* 100* 96* 95* 96*  CO2 21* _0 GLUCOSE 163* 187* 442* 185* 334* 347* 171*  BUN 132* 141* 152* 97* 69* 92* 60*  CREATININE 4.44* 4.30* 4.08* 2.87* 2.85* 3.53* 3.13*  CALCIUM 8.9 9.0 8.7* 8.5* 8.3* 8.4* 8.3*  PHOS 6.2* 4.9* 5.5* 4.4 4.2  --   --    Liver Function Tests: Recent Labs  Lab 11/07/17 0647 11/08/17 0544  11/09/17 0459 11/10/17 0507 11/11/17 0415  ALBUMIN 2.2* 2.1* 2.0* 2.3* 2.1*   No results for input(s): LIPASE, AMYLASE in the last 168 hours. No results for input(s): AMMONIA in the last 168 hours. CBC: Recent Labs  Lab 11/08/17 0544 11/09/17 0459 11/10/17 0507 11/11/17 0415 11/13/17 0454  WBC 18.4* 15.9* 18.4* 16.1* 17.1*  HGB 11.3* 11.1* 11.7* 11.2* 11.2*  HCT 35.6* 35.6* 38.3 36.3 36.8  MCV 85.6 86.4 87.8 88.5 89.5  PLT 477* 477* 451* 409* 355   Cardiac Enzymes: No results for input(s): CKTOTAL, CKMB, CKMBINDEX, TROPONINI in the last 168 hours. BNP: Invalid input(s): POCBNP CBG: Recent Labs  Lab 11/12/17 1148 11/12/17 1627 11/12/17 2048 11/13/17 0729 11/13/17 1132  GLUCAP 199* 192* 180* 154* 168*   D-Dimer No results for input(s): DDIMER in the last 72 hours. Hgb A1c No results for input(s): HGBA1C in the last 72 hours. Lipid Profile No results for input(s): CHOL, HDL, LDLCALC, TRIG, CHOLHDL, LDLDIRECT in the last 72 hours. Thyroid function studies No results for input(s): TSH, T4TOTAL, T3FREE, THYROIDAB in the last 72 hours.  Invalid input(s): FREET3 Anemia work up No results for input(s): VITAMINB12, FOLATE, FERRITIN, TIBC, IRON, RETICCTPCT in the last 72 hours. Urinalysis    Component Value Date/Time   COLORURINE YELLOW 11/02/2017 1527   APPEARANCEUR CLOUDY (A) 11/02/2017 1527   LABSPEC 1.010 11/02/2017 1527   PHURINE 8.0 11/02/2017 1527   GLUCOSEU NEGATIVE 11/02/2017 1527   HGBUR NEGATIVE 11/02/2017 1527   BILIRUBINUR NEGATIVE 11/02/2017 1527   KETONESUR NEGATIVE 11/02/2017 1527   PROTEINUR 100 (A) 11/02/2017 1527   UROBILINOGEN 1.0 03/17/2014 1830   NITRITE NEGATIVE 11/02/2017 1527   LEUKOCYTESUR LARGE (A) 11/02/2017 1527   Sepsis Labs Invalid input(s): PROCALCITONIN,  WBC,  LACTICIDVEN Microbiology No results found for this or any previous visit (from the past 240 hour(s)).   Time coordinating discharge: Over 30  minutes  SIGNED:   Rodena Goldmann, DO [facility] 11/13/2017, 4:12 PM Pager [phone]  If 7PM-7AM, please contact night-coverage [email or web address] Password [email or web address]

## 2017-11-13 NOTE — Progress Notes (Signed)
ANTICOAGULATION CONSULT NOTE - follow up  Pharmacy Consult for Warfarin (home med) Indication: atrial fibrillation  Allergies  Allergen Reactions  . Ace Inhibitors Cough   Patient Measurements: Height: 5\' 4"  (162.6 cm) Weight: 182 lb 15.7 oz (83 kg) IBW/kg (Calculated) : 54.7  Vital Signs: Temp: 98.9 F (37.2 C) (02/15 0531) Temp Source: Oral (02/15 0531) BP: 166/47 (02/15 0531) Pulse Rate: 86 (02/15 0531)  Labs: Recent Labs    11/11/17 0415 11/12/17 0540 11/13/17 0454  HGB 11.2*  --  11.2*  HCT 36.3  --  36.8  PLT 409*  --  355  LABPROT 17.9* 18.0* 21.1*  INR 1.49 1.51 1.84  CREATININE 2.85* 3.53* 3.13*   Estimated Creatinine Clearance: 17.2 mL/min (A) (by C-G formula based on SCr of 3.13 mg/dL (H)).  Medical History: Past Medical History:  Diagnosis Date  . Anemia   . Arthritis   . Asthma   . Atrial fibrillation ([place])   . Atrial flutter [facility])    TEE/DCCV December 2013 - on Xarelto [facility])  . Bone spur of toe    Right 5th toe  . Chronic diastolic CHF (congestive heart failure) (HCC)    a. EF 50-55% by echo in 2015  . CKD (chronic kidney disease) stage 4, GFR 15-29 ml/min (HCC)    a. s/p fistula placement but not on HD. Followed by Nephrology.   Marland Kitchen COPD (chronic obstructive pulmonary disease) ([place])   . DVT of upper extremity (deep vein thrombosis) ([place])    Right basilic vein, June 8144  . Essential hypertension, benign   . Fractures   . SLE (systemic lupus erythematosus) ([place])   . Type 2 diabetes mellitus ([place])   . UTI (lower urinary tract infection)    Medications:  Medications Prior to Admission  Medication Sig Dispense Refill Last Dose  . acetaminophen (TYLENOL) 500 MG tablet Take 1,000 mg by mouth every 6 (six) hours as needed. For pain   Taking  . albuterol (ACCUNEB) 0.63 MG/3ML nebulizer solution Take 1 ampule by nebulization every 6 (six) hours as needed for wheezing or shortness of breath.    11/01/2017 at Unknown time  . albuterol (PROVENTIL  HFA;VENTOLIN HFA) 108 (90 BASE) MCG/ACT inhaler Inhale 2 puffs into the lungs every 6 (six) hours as needed. For shortness of breath   11/01/2017 at Unknown time  . amLODipine (NORVASC) 10 MG tablet Take 1 tablet (10 mg total) by mouth at bedtime. 30 tablet 0 11/02/2017 at Unknown time  . atorvastatin (LIPITOR) 40 MG tablet Take 40 mg at bedtime by mouth.    11/01/2017 at Unknown time  . brimonidine-timolol (COMBIGAN) 0.2-0.5 % ophthalmic solution Place 1 drop into the left eye daily.    11/01/2017 at Unknown time  . calcitRIOL (ROCALTROL) 0.25 MCG capsule Take 0.25 mcg by mouth as directed. Take on Mondays Wednesdays and Fridays   11/02/2017 at Unknown time  . diclofenac (VOLTAREN) 0.1 % ophthalmic solution Place 1 drop into the left eye daily. Uses at lunch time   11/01/2017 at Unknown time  . dicyclomine (BENTYL) 10 MG capsule Take 10 mg by mouth 4 (four) times daily -  before meals and at bedtime.   11/02/2017 at Unknown time  . febuxostat (ULORIC) 40 MG tablet Take 40 mg by mouth daily.   11/02/2017 at Unknown time  . folic acid (FOLVITE) 1 MG tablet Take 1 mg by mouth daily.   11/02/2017 at Unknown time  . furosemide (LASIX) 80 MG tablet Take 160 mg  by mouth 2 (two) times daily.    11/02/2017 at Unknown time  . HUMALOG KWIKPEN 100 UNIT/ML KiwkPen INJECT UP TO 11 UNITS SUBCUTANEOUSLY BEFORE MEALS THREE TIMES DAILY. 15 mL 2 11/02/2017 at Unknown time  . HYDROcodone-acetaminophen (NORCO/VICODIN) 5-325 MG tablet 1 or 2 tabs PO q12 hours prn pain 10 tablet 0 11/01/2017 at Unknown time  . hydroxychloroquine (PLAQUENIL) 200 MG tablet Take 200 mg by mouth as directed. Take 1 1/2 tablets daily   11/02/2017 at 900  . Insulin Detemir (LEVEMIR FLEXTOUCH) 100 UNIT/ML Pen Inject 10 Units into the skin daily at 10 pm. 5 pen 2 11/01/2017 at Unknown time  . metoprolol tartrate (LOPRESSOR) 50 MG tablet TAKE ONE TABLET BY MOUTH TWICE DAILY. 60 tablet 6 11/02/2017 at 900  . Multiple Vitamins-Minerals (CENTRUM SILVER PO) Take 1 tablet by mouth  daily.   11/02/2017 at Unknown time  . ONE TOUCH ULTRA TEST test strip TEST 4 TIMES A DAY AS DIRECTED. 150 each 5 11/02/2017 at Unknown time  . ONETOUCH DELICA LANCETS 16X MISC USE TO TEST 4 TIMES DAILY. 100 each 5 11/02/2017 at Unknown time  . predniSONE (DELTASONE) 5 MG tablet Take 5 mg by mouth daily with breakfast.   11/02/2017 at Unknown time  . travoprost, benzalkonium, (TRAVATAN) 0.004 % ophthalmic solution Place 1 drop into the left eye at bedtime.   11/01/2017 at Unknown time  . UNIFINE PENTIPS 31G X 6 MM MISC USE AS DIRECTED AT BEDTIME WITH LEVEMIR AND WITH SLIDING SCALE INSULIN. 150 each 5 11/02/2017 at Unknown time  . Vitamin D, Ergocalciferol, (DRISDOL) 50000 units CAPS capsule TAKE 1 CAPSULE BY MOUTH ONCE WEEKLY. 12 capsule 0 Past Week at Unknown time  . warfarin (COUMADIN) 5 MG tablet Take 1 tablet daily (Patient taking differently: Take 5 mg by mouth daily at 6 PM. Take 1 tablet daily) 180 tablet 3 11/01/2017 at 1800    Home dose = 5mg  daily per PTA med list.  Assessment: 72 yo woman admitted due to renal failure, acute on chronic. INR was supratherapeutic > 10 with no e/o bleeding. Vitamin K 5mg  po given x 1 on 2/7.  Coumadin on hold for placement of tunnel catheter at [facility] on 11/09/17 which was completed. INR remains below goal but trending up.   Goal of Therapy:  INR 2-3   Plan:  Coumadin 5mg  x 1 today Daily PT/INR Monitor for s/s of bleeding  Margot Ables, PharmD Clinical Pharmacist 11/13/2017 10:58 AM

## 2017-11-15 ENCOUNTER — Encounter (HOSPITAL_COMMUNITY)
Admission: RE | Admit: 2017-11-15 | Discharge: 2017-11-15 | Disposition: A | Discharge status: Home / Self Care | Payer: Medicare Other | Source: Ambulatory Visit | Attending: Oncology | Admitting: Oncology

## 2017-11-15 DIAGNOSIS — N184 Chronic kidney disease, stage 4 (severe): Secondary | ICD-10-CM | POA: Insufficient documentation

## 2017-11-15 DIAGNOSIS — D631 Anemia in chronic kidney disease: Secondary | ICD-10-CM | POA: Insufficient documentation

## 2017-11-15 LAB — CBC WITH DIFFERENTIAL/PLATELET
BASOS ABS: 0 10*3/uL (ref 0.0–0.1)
BASOS PCT: 0 %
EOS ABS: 0.1 10*3/uL (ref 0.0–0.7)
Eosinophils Relative: 1 %
HCT: 33.4 % — ABNORMAL LOW (ref 36.0–46.0)
Hemoglobin: 10.4 g/dL — ABNORMAL LOW (ref 12.0–15.0)
Lymphocytes Relative: 13 %
Lymphs Abs: 2.1 10*3/uL (ref 0.7–4.0)
MCH: 27.8 pg (ref 26.0–34.0)
MCHC: 31.1 g/dL (ref 30.0–36.0)
MCV: 89.3 fL (ref 78.0–100.0)
MONO ABS: 0.2 10*3/uL (ref 0.1–1.0)
Monocytes Relative: 1 %
NEUTROS ABS: 13.1 10*3/uL — AB (ref 1.7–7.7)
Neutrophils Relative %: 85 %
PLATELETS: 277 10*3/uL (ref 150–400)
RBC: 3.74 MIL/uL — ABNORMAL LOW (ref 3.87–5.11)
RDW: 17.9 % — AB (ref 11.5–15.5)
WBC: 15.5 10*3/uL — ABNORMAL HIGH (ref 4.0–10.5)

## 2017-11-15 LAB — PROTIME-INR
INR: 2.21
PROTHROMBIN TIME: 24.3 s — AB (ref 11.4–15.2)

## 2017-11-15 LAB — BASIC METABOLIC PANEL
ANION GAP: 11 (ref 5–15)
BUN: 94 mg/dL — ABNORMAL HIGH (ref 6–20)
CALCIUM: 8.4 mg/dL — AB (ref 8.9–10.3)
CO2: 29 mmol/L (ref 22–32)
CREATININE: 4.52 mg/dL — AB (ref 0.44–1.00)
Chloride: 97 mmol/L — ABNORMAL LOW (ref 101–111)
GFR calc Af Amer: 10 mL/min — ABNORMAL LOW (ref >60)
GFR, EST NON AFRICAN AMERICAN: 9 mL/min — AB (ref >60)
Glucose, Bld: 193 mg/dL — ABNORMAL HIGH (ref 65–99)
Potassium: 3.5 mmol/L (ref 3.5–5.1)
Sodium: 137 mmol/L (ref 135–145)

## 2017-11-16 ENCOUNTER — Other Ambulatory Visit: Payer: Self-pay

## 2017-11-16 ENCOUNTER — Non-Acute Institutional Stay (SKILLED_NURSING_FACILITY): Payer: Medicare Other | Admitting: Internal Medicine

## 2017-11-16 ENCOUNTER — Encounter: Payer: Self-pay | Admitting: Internal Medicine

## 2017-11-16 DIAGNOSIS — N2581 Secondary hyperparathyroidism of renal origin: Secondary | ICD-10-CM | POA: Diagnosis not present

## 2017-11-16 DIAGNOSIS — E1129 Type 2 diabetes mellitus with other diabetic kidney complication: Secondary | ICD-10-CM | POA: Diagnosis not present

## 2017-11-16 DIAGNOSIS — I5032 Chronic diastolic (congestive) heart failure: Secondary | ICD-10-CM

## 2017-11-16 DIAGNOSIS — Z992 Dependence on renal dialysis: Secondary | ICD-10-CM

## 2017-11-16 DIAGNOSIS — E1140 Type 2 diabetes mellitus with diabetic neuropathy, unspecified: Secondary | ICD-10-CM | POA: Diagnosis not present

## 2017-11-16 DIAGNOSIS — J441 Chronic obstructive pulmonary disease with (acute) exacerbation: Secondary | ICD-10-CM

## 2017-11-16 DIAGNOSIS — N186 End stage renal disease: Secondary | ICD-10-CM | POA: Insufficient documentation

## 2017-11-16 DIAGNOSIS — M3214 Glomerular disease in systemic lupus erythematosus: Secondary | ICD-10-CM | POA: Diagnosis not present

## 2017-11-16 DIAGNOSIS — I4892 Unspecified atrial flutter: Secondary | ICD-10-CM

## 2017-11-16 DIAGNOSIS — E876 Hypokalemia: Secondary | ICD-10-CM | POA: Diagnosis not present

## 2017-11-16 DIAGNOSIS — E039 Hypothyroidism, unspecified: Secondary | ICD-10-CM | POA: Diagnosis not present

## 2017-11-16 DIAGNOSIS — I10 Essential (primary) hypertension: Secondary | ICD-10-CM | POA: Diagnosis not present

## 2017-11-16 DIAGNOSIS — D689 Coagulation defect, unspecified: Secondary | ICD-10-CM | POA: Diagnosis not present

## 2017-11-16 DIAGNOSIS — Z794 Long term (current) use of insulin: Secondary | ICD-10-CM

## 2017-11-16 DIAGNOSIS — D631 Anemia in chronic kidney disease: Secondary | ICD-10-CM | POA: Diagnosis not present

## 2017-11-16 MED ORDER — HYDROCODONE-ACETAMINOPHEN 5-325 MG PO TABS: ORAL_TABLET | ORAL | 0 refills | Status: DC

## 2017-11-16 NOTE — Telephone Encounter (Signed)
RX Fax for [facility]@ [phone]  

## 2017-11-16 NOTE — Progress Notes (Signed)
Provider: Veleta Miners  Location:   [place] Room Number: 315/Q Place of Service:  SNF (31)  PCP: Sharilyn Sites, MD Patient Care Team: Sharilyn Sites, MD as PCP - General (Family Medicine) Satira Sark, MD as Consulting Physician (Cardiology) Donato Heinz, MD as Consulting Physician (Nephrology) Corliss Parish, MD as Consulting Physician (Nephrology) Early, Arvilla Meres, MD as Consulting Physician (Vascular Surgery) Farrel Gobble, MD (Inactive) as Consulting Physician (Hematology and Oncology) [name] [name], MD as Consulting Physician (Gastroenterology)  Extended Emergency Contact Information Primary Emergency Contact: [facility] Address: [address] [place]          [place], [place] [address] [place] Phone: [phone] Mobile Phone: [phone] Relation: Spouse  Code Status: Full Code Goals of Care: Advanced Directive information Advanced Directives 11/16/2017  Does Patient Have a Medical Advance Directive? Yes  Type of Advance Directive (No Data)  Does patient want to make changes to medical advance directive? No - Patient declined  Copy of [place] in Chart? No - copy requested  Would patient like information on creating a medical advance directive? No - Patient declined  Pre-existing out of facility DNR order (yellow form or pink MOST form) -      Chief Complaint  Patient presents with  . New Admit To SNF    New Admission Visit    HPI: Patient is a 72 y.o. female seen today for admission to SNF for therapy after staying in the hospital for Pneumonia and COPD Exacerbation.11/02/17-11/13/17  Patient has h/o ESRD started on Dialysis this admission, COPD on Chronic Prednisone, Diabetes Mellitus, Hyperlipidemia, Chronic Diastolic failure EF 55 % in 02/19, Paroxysmal Atrial Fibrillation, Systemic Lupus, Hypertension,   Patient came to the emergency room with worsening shortness of breath,  poor oral intake, abdominal pain.  She was treated with IV Zosyn.  She was also RSV positive.     She was volume overloaded and did not respond to IV Lasix she was eventually started on dialysis.  She had a tunnel dialysis catheter placed on 02/11.  And had a couple of dialysis sessions in the hospital.  She also required some prednisone boluses due to COPD exacerbation which resolved after dialysis.  Her abdominal pain also Resolved.  Patient's main complaint today is thrush in her mouth causing her to have problems with eating.  She denies any chest pain, cough or shortness of breath. Patient lives with her husband was completely independent in her ADLs before this admission.   Past Medical History:  Diagnosis Date  . Anemia   . Arthritis   . Asthma   . Atrial fibrillation ([place])   . Atrial flutter [facility])    TEE/DCCV December 2013 - on Xarelto [facility])  . Bone spur of toe    Right 5th toe  . Chronic diastolic CHF (congestive heart failure) (HCC)    a. EF 50-55% by echo in 2015  . CKD (chronic kidney disease) stage 4, GFR 15-29 ml/min (HCC)    a. s/p fistula placement but not on HD. Followed by Nephrology.   Marland Kitchen COPD (chronic obstructive pulmonary disease) ([place])   . DVT of upper extremity (deep vein thrombosis) ([place])    Right basilic vein, June 3825  . Essential hypertension, benign   . Fractures   . SLE (systemic lupus erythematosus) ([place])   . Type 2 diabetes mellitus ([place])   . UTI (lower urinary tract infection)    Past Surgical History:  Procedure Laterality Date  .  ABDOMINAL HYSTERECTOMY  1983  . ANKLE SURGERY  1993  . AV FISTULA PLACEMENT Left 03/27/2014   Procedure: ARTERIOVENOUS (AV) FISTULA CREATION;  Surgeon: Rosetta Posner, MD;  Location: [place];  Service: Vascular;  Laterality: Left;  . BACK SURGERY  1980  . CARDIOVASCULAR STRESS TEST  12/19/2009   no stress induced rhythm abnormalities, ekg negative for ischemia  . CARDIOVERSION  09/16/2012   Procedure: CARDIOVERSION;   Surgeon: Sanda Klein, MD;  Location[facility] ENDOSCOPY;  Service: Cardiovascular;  Laterality: N/A;  . COLONOSCOPY  09/20/2012   Dr. [name]: multiple tubular adenomas   . COLONOSCOPY  2005   Dr. [name]. Polyps, path unknown   . COLONOSCOPY N/A 07/24/2016   Procedure: COLONOSCOPY;  Surgeon: [name], MD;  [facility];  Service: Endoscopy;  Laterality: N/A;  9:00 am  . ESOPHAGOGASTRODUODENOSCOPY  09/20/2012   Dr. [name]: duodenal erosion and possible resolving ulcer at the angularis   . IR FLUORO GUIDE CV LINE RIGHT  11/09/2017  . IR US GUIDE VASC ACCESS RIGHT  11/09/2017  . TEE WITHOUT CARDIOVERSION  09/16/2012   Procedure: TRANSESOPHAGEAL ECHOCARDIOGRAM (TEE);  Surgeon: Sanda Klein, MD;  Location: [facility] ENDOSCOPY;  Service: Cardiovascular;  Laterality: N/A;  . TRANSESOPHAGEAL ECHOCARDIOGRAM WITH CARDIOVERSION  09/16/2012   EF 60-65%, moderate LVH, moderate regurg of the aortic valve, LA moderately dilated    reports that she quit smoking about 5 years ago. Her smoking use included cigarettes. She has a 30.00 pack-year smoking history. she has never used smokeless tobacco. She reports that she does not drink alcohol or use drugs. Social History   Socioeconomic History  . Marital status: Married    Spouse name: Not on file  . Number of children: Not on file  . Years of education: Not on file  . Highest education level: Not on file  Social Needs  . Financial resource strain: Not on file  . Food insecurity - worry: Not on file  . Food insecurity - inability: Not on file  . Transportation needs - medical: Not on file  . Transportation needs - non-medical: Not on file  Occupational History  . Not on file  Tobacco Use  . Smoking status: Former Smoker    Packs/day: 1.00    Years: 30.00    Pack years: 30.00    Types: Cigarettes    Last attempt to quit: 08/29/2012    Years since quitting: 5.2  . Smokeless tobacco: Never Used  . Tobacco comment: quit 08-2012  Substance  and Sexual Activity  . Alcohol use: No    Alcohol/week: 0.0 oz  . Drug use: No  . Sexual activity: Yes    Partners: Male  Other Topics Concern  . Not on file  Social History Narrative  . Not on file    Functional Status Survey:    Family History  Problem Relation Age of Onset  . Diabetes Brother   . Diabetes Father   . Hypertension Father   . Hypertension Sister   . Stroke Mother   . Diabetes Sister   . Hypertension Brother   . Colon cancer Neg Hx     Health Maintenance  Topic Date Due  . FOOT EXAM  12/14/2017 (Originally 11/27/1955)  . OPHTHALMOLOGY EXAM  12/14/2017 (Originally 03/28/2016)  . URINE MICROALBUMIN  12/14/2017 (Originally 11/27/1955)  . DEXA SCAN  12/14/2017 (Originally 11/26/2010)  . TETANUS/TDAP  12/14/2017 (Originally 11/26/1964)  . INFLUENZA VACCINE  07/08/2018 (Originally 04/29/2017)  . HEMOGLOBIN A1C  04/19/2018  . PNA vac Low Risk Adult (2 of 2 - PCV13) 11/04/2018  . MAMMOGRAM  09/29/2019  . COLONOSCOPY  07/24/2026  . Hepatitis C Screening  Completed    Allergies  Allergen Reactions  . Ace Inhibitors Cough    Allergies as of 11/16/2017      Reactions   Ace Inhibitors Cough      Medication List        Accurate as of 11/16/17 10:20 AM. Always use your most recent med list.          acetaminophen 500 MG tablet Commonly known as:  TYLENOL Take 1,000 mg by mouth every 6 (six) hours as needed. For pain   albuterol 0.63 MG/3ML nebulizer solution Commonly known as:  ACCUNEB Take 1 ampule by nebulization every 6 (six) hours as needed for wheezing or shortness of breath.   albuterol 108 (90 Base) MCG/ACT inhaler Commonly known as:  PROVENTIL HFA;VENTOLIN HFA Inhale 2 puffs into the lungs every 6 (six) hours as needed. For shortness of breath   amLODipine 10 MG tablet Commonly known as:  NORVASC Take 1 tablet (10 mg total) by mouth at bedtime.   atorvastatin 40 MG tablet Commonly known as:  LIPITOR Take 40 mg at bedtime by mouth.     brimonidine 0.2 % ophthalmic solution Commonly known as:  ALPHAGAN Place 1 drop into the left eye daily.   calcitRIOL 0.25 MCG capsule Commonly known as:  ROCALTROL Take 0.25 mcg by mouth as directed. Take on Mondays Wednesdays and Fridays   calcium acetate 667 MG capsule Commonly known as:  PHOSLO Take 2 capsules (1,334 mg total) by mouth 3 (three) times daily with meals.   CENTRUM SILVER PO Take 1 tablet by mouth daily.   COMBIGAN 0.2-0.5 % ophthalmic solution Generic drug:  brimonidine-timolol Place 1 drop into the left eye daily.   diclofenac 0.1 % ophthalmic solution Commonly known as:  VOLTAREN Place 1 drop into the left eye daily. Uses at lunch time   dicyclomine 10 MG capsule Commonly known as:  BENTYL Take 10 mg by mouth 4 (four) times daily -  before meals and at bedtime.   febuxostat 40 MG tablet Commonly known as:  ULORIC Take 40 mg by mouth daily.   folic acid 1 MG tablet Commonly known as:  FOLVITE Take 1 mg by mouth daily.   furosemide 80 MG tablet Commonly known as:  LASIX Take 160 mg by mouth 2 (two) times daily.   HUMALOG KWIKPEN 100 UNIT/ML KiwkPen Generic drug:  insulin lispro Give per sliding scale from 2-10 units subcutaneous three times a day.   HYDROcodone-acetaminophen 5-325 MG tablet Commonly known as:  NORCO/VICODIN 1 or 2 tabs PO q12 hours prn pain   hydroxychloroquine 200 MG tablet Commonly known as:  PLAQUENIL Take 300 mg by mouth as directed. Take 1 1/2 tablets daily   Insulin Detemir 100 UNIT/ML Pen Commonly known as:  LEVEMIR FLEXTOUCH Inject 10 Units into the skin daily at 10 pm.   metoprolol tartrate 50 MG tablet Commonly known as:  LOPRESSOR TAKE ONE TABLET BY MOUTH TWICE DAILY.   ONE TOUCH ULTRA TEST test strip Generic drug:  glucose blood TEST 4 TIMES A DAY AS DIRECTED.   ONETOUCH DELICA LANCETS 75Z Misc USE TO TEST 4 TIMES DAILY.   predniSONE 5 MG tablet Commonly known as:  DELTASONE Take 5 mg by mouth daily  with breakfast.   timolol 0.5 % ophthalmic solution Commonly known as:  TIMOPTIC  Place 1 drop into the left eye daily.   travoprost (benzalkonium) 0.004 % ophthalmic solution Commonly known as:  TRAVATAN Place 1 drop into the left eye at bedtime.   UNIFINE PENTIPS 31G X 6 MM Misc Generic drug:  Insulin Pen Needle USE AS DIRECTED AT BEDTIME WITH LEVEMIR AND WITH SLIDING SCALE INSULIN.   Vitamin D (Ergocalciferol) 50000 units Caps capsule Commonly known as:  DRISDOL TAKE 1 CAPSULE BY MOUTH ONCE WEEKLY.   warfarin 5 MG tablet Commonly known as:  COUMADIN Take as directed by the anticoagulation clinic. If you are unsure how to take this medication, talk to your nurse or doctor. Original instructions:  Take 5 mg by mouth daily.       Review of Systems  Review of Systems  Constitutional: Negative for activity change, appetite change, chills, diaphoresis, fatigue and fever.  HENT: Negative for postnasal drip, rhinorrhea, sinus pain and sore throat.   Respiratory: Negative for apnea, cough, chest tightness, shortness of breath and wheezing.   Cardiovascular: Negative for chest pain, palpitations and leg swelling.  Gastrointestinal: Negative for abdominal distention, abdominal pain, constipation, diarrhea, nausea and vomiting.  Genitourinary: Negative for dysuria and frequency.  Musculoskeletal: Negative for arthralgias, joint swelling and myalgias.  Skin: Negative for rash.  Neurological: Negative for dizziness, syncope, weakness, light-headedness and numbness.  Psychiatric/Behavioral: Negative for behavioral problems, confusion and sleep disturbance.     Vitals:   11/16/17 1019  BP: (!) 135/45  Pulse: 73  Resp: 20  Temp: 97.8 F (36.6 C)  TempSrc: Oral  Weight: 179 lb 6.4 oz (81.4 kg)   Body mass index is 30.79 kg/m. Physical Exam  Constitutional: She appears well-developed and well-nourished.  HENT:  Head: Normocephalic.  Has thrush in her Mouth  Eyes: Pupils are  equal, round, and reactive to light.  Neck: Neck supple.  Cardiovascular: Normal rate and normal heart sounds.  No murmur heard. Pulmonary/Chest: Effort normal and breath sounds normal. No respiratory distress. She has no wheezes. She has no rales.  Abdominal: Soft. Bowel sounds are normal. She exhibits no distension. There is no tenderness. There is no rebound.  Musculoskeletal:  Has Mild edema Bilateral.  Lymphadenopathy:    She has no cervical adenopathy.  Skin:  Has  Wound on Right Extremity in Back of the leg  Psychiatric: She has a normal mood and affect. Her behavior is normal. Thought content normal.    Labs reviewed: Basic Metabolic Panel: Recent Labs    11/06/17 0547  11/09/17 0459 11/10/17 0507 11/11/17 0415 11/12/17 0540 11/13/17 0454 11/15/17 0500  NA 134*   < > 138 141 136 136 136 137  K 4.1   < > 4.0 3.9 3.8 3.8 3.7 3.5  CL 99*   < > 100* 100* 96* 95* 96* 97*  CO2 20*   < > 24 30 27 27 29 29   GLUCOSE 155*   < > 442* 185* 334* 347* 171* 193*  BUN 128*   < > 152* 97* 69* 92* 60* 94*  CREATININE 4.56*   < > 4.08* 2.87* 2.85* 3.53* 3.13* 4.52*  CALCIUM 8.6*   < > 8.7* 8.5* 8.3* 8.4* 8.3* 8.4*  MG 2.4  --   --   --   --   --   --   --   PHOS 7.0*   < > 5.5* 4.4 4.2  --   --   --    < > = values in this interval not displayed.  Liver Function Tests: Recent Labs    10/20/17 1029 10/21/17 1027 11/02/17 1536  11/09/17 0459 11/10/17 0507 11/11/17 0415  AST 24 28 37  --   --   --   --   ALT 24 25 27   --   --   --   --   ALKPHOS 69 56 108  --   --   --   --   BILITOT 0.2 0.3 0.6  --   --   --   --   PROT 6.6 7.1 7.4  --   --   --   --   ALBUMIN 3.3* 2.8* 2.3*   < > 2.0* 2.3* 2.1*   < > = values in this interval not displayed.   Recent Labs    08/13/17 1021 11/02/17 1536  LIPASE 24 20   No results for input(s): AMMONIA in the last 8760 hours. CBC: Recent Labs    10/21/17 1027 11/02/17 1536  11/11/17 0415 11/13/17 0454 11/15/17 0500  WBC 5.0  17.7*   < > 16.1* 17.1* 15.5*  NEUTROABS 3.9 16.3*  --   --   --  13.1*  HGB 11.0* 10.1*   < > 11.2* 11.2* 10.4*  HCT 36.2 32.2*   < > 36.3 36.8 33.4*  MCV 90.3 87.5   < > 88.5 89.5 89.3  PLT 187 262   < > 409* 355 277   < > = values in this interval not displayed.   Cardiac Enzymes: Recent Labs    11/03/17 0208 11/03/17 0801 11/03/17 1432  TROPONINI 0.09* 0.09* 0.08*   BNP: Invalid input(s): POCBNP Lab Results  Component Value Date   HGBA1C 5.6 10/20/2017   Lab Results  Component Value Date   TSH 2.360 09/19/2015   Lab Results  Component Value Date   VITAMINB12 1,484 (H) 10/21/2017   Lab Results  Component Value Date   FOLATE >100.0 10/21/2017   Lab Results  Component Value Date   IRON 12 (L) 11/03/2017   TIBC 104 (L) 11/03/2017   FERRITIN 752 (H) 11/03/2017    Imaging and Procedures obtained prior to SNF admission: No results found.  Assessment/Plan  Acute Hypoxemic  Patient was treated for COPD exacerbation  she also received 7 Days of Zosyn. Patient has been doing well. Her cough is resolved   ESRD Patient tolerating Dialysis. Follow up with Nephrology COPD with acute exacerbation  She has been On Chronic Prednisone as outpatient Diabetes Type 2 On [place] Home dose Also on Sliding scale. Continue Accu Check will follow.  H/o  SLE type   Continue on Plaquenil  Essential hypertension BP Controlled on Amlodipine  Chronic diastolic heart failure  Patient is doing better since started on Dialysis. Also on Lasix Atrial fibrillation On Coumadin INR was therapeutic Will follow again  Rate Control on Metoprolol Oral Thrush Will start on Diflucan  Continue on Swish and spit. Hyperlipidemia Continue on Lipitor Non Healing Ulcer of RLE. Follows with Vascular as outpatient. Will continue Dry Dressing. Disposition Started on Therapy Discharge Home  Family/ staff Communication:   Labs/tests ordered: Total time spent in this patient care  encounter was 45_ minutes; greater than 50% of the visit spent counseling patient, reviewing records , Labs and coordinating care for problems addressed at this encounter.

## 2017-11-17 ENCOUNTER — Encounter (HOSPITAL_COMMUNITY): Payer: Self-pay | Admitting: *Deleted

## 2017-11-17 ENCOUNTER — Telehealth (HOSPITAL_COMMUNITY): Payer: Self-pay | Admitting: *Deleted

## 2017-11-17 ENCOUNTER — Encounter (HOSPITAL_COMMUNITY)
Admission: RE | Admit: 2017-11-17 | Discharge: 2017-11-17 | Disposition: A | Discharge status: Home / Self Care | Payer: Medicare Other | Source: Skilled Nursing Facility | Attending: Internal Medicine | Admitting: Internal Medicine

## 2017-11-17 DIAGNOSIS — N186 End stage renal disease: Secondary | ICD-10-CM | POA: Diagnosis not present

## 2017-11-17 DIAGNOSIS — D689 Coagulation defect, unspecified: Secondary | ICD-10-CM | POA: Diagnosis not present

## 2017-11-17 DIAGNOSIS — D509 Iron deficiency anemia, unspecified: Secondary | ICD-10-CM | POA: Insufficient documentation

## 2017-11-17 DIAGNOSIS — D631 Anemia in chronic kidney disease: Secondary | ICD-10-CM | POA: Diagnosis not present

## 2017-11-17 DIAGNOSIS — E1129 Type 2 diabetes mellitus with other diabetic kidney complication: Secondary | ICD-10-CM | POA: Insufficient documentation

## 2017-11-17 DIAGNOSIS — I10 Essential (primary) hypertension: Secondary | ICD-10-CM | POA: Insufficient documentation

## 2017-11-17 DIAGNOSIS — E785 Hyperlipidemia, unspecified: Secondary | ICD-10-CM | POA: Insufficient documentation

## 2017-11-17 DIAGNOSIS — I4892 Unspecified atrial flutter: Secondary | ICD-10-CM | POA: Insufficient documentation

## 2017-11-17 DIAGNOSIS — N2581 Secondary hyperparathyroidism of renal origin: Secondary | ICD-10-CM | POA: Diagnosis not present

## 2017-11-17 DIAGNOSIS — E039 Hypothyroidism, unspecified: Secondary | ICD-10-CM | POA: Diagnosis not present

## 2017-11-17 DIAGNOSIS — N184 Chronic kidney disease, stage 4 (severe): Secondary | ICD-10-CM | POA: Insufficient documentation

## 2017-11-17 DIAGNOSIS — E876 Hypokalemia: Secondary | ICD-10-CM | POA: Diagnosis not present

## 2017-11-17 LAB — BASIC METABOLIC PANEL
Anion gap: 12 (ref 5–15)
BUN: 41 mg/dL — AB (ref 6–20)
CHLORIDE: 97 mmol/L — AB (ref 101–111)
CO2: 26 mmol/L (ref 22–32)
CREATININE: 3.21 mg/dL — AB (ref 0.44–1.00)
Calcium: 7.8 mg/dL — ABNORMAL LOW (ref 8.9–10.3)
GFR calc Af Amer: 16 mL/min — ABNORMAL LOW (ref >60)
GFR calc non Af Amer: 14 mL/min — ABNORMAL LOW (ref >60)
Glucose, Bld: 183 mg/dL — ABNORMAL HIGH (ref 65–99)
Potassium: 3.5 mmol/L (ref 3.5–5.1)
SODIUM: 135 mmol/L (ref 135–145)

## 2017-11-17 LAB — PROTIME-INR
INR: 3.16
Prothrombin Time: 32.2 seconds — ABNORMAL HIGH (ref 11.4–15.2)

## 2017-11-17 LAB — CBC WITH DIFFERENTIAL/PLATELET
BASOS ABS: 0 10*3/uL (ref 0.0–0.1)
BASOS PCT: 0 %
Eosinophils Absolute: 0.1 10*3/uL (ref 0.0–0.7)
Eosinophils Relative: 1 %
HCT: 30.8 % — ABNORMAL LOW (ref 36.0–46.0)
Hemoglobin: 9.5 g/dL — ABNORMAL LOW (ref 12.0–15.0)
Lymphocytes Relative: 7 %
Lymphs Abs: 0.8 10*3/uL (ref 0.7–4.0)
MCH: 27.9 pg (ref 26.0–34.0)
MCHC: 30.8 g/dL (ref 30.0–36.0)
MCV: 90.3 fL (ref 78.0–100.0)
MONO ABS: 1 10*3/uL (ref 0.1–1.0)
Monocytes Relative: 9 %
Neutro Abs: 9.7 10*3/uL — ABNORMAL HIGH (ref 1.7–7.7)
Neutrophils Relative %: 83 %
PLATELETS: 222 10*3/uL (ref 150–400)
RBC: 3.41 MIL/uL — ABNORMAL LOW (ref 3.87–5.11)
RDW: 18 % — AB (ref 11.5–15.5)
WBC: 11.5 10*3/uL — ABNORMAL HIGH (ref 4.0–10.5)

## 2017-11-17 NOTE — Telephone Encounter (Signed)
Patient's husband called to inform us that Mrs. [name] was now getting dialysis and the center would be giving her Aranesp moving forward.    I called [facility] [phone] and spoke with Annye English, RN.  She informed me that that patient would be getting dialysis on T, TH, S and this was her first week of getting dialysis.  She states that they will be managing her hemoglobin and will be giving her Micera every 2 weeks to maintain her hemoglobin.   She also states that they will be giving her iron if needed.    All appointments for labs and injections moving forward with our clinic have been cancelled.  We will keep her follow up appointments with the providers.

## 2017-11-17 NOTE — Telephone Encounter (Signed)
If the nephrology team is willing to manage all aspects of her anemia, then we can potentially release her from follow-up here at the cancer center.  However, I would prefer documentation that they are willing to manage her anemia going forward before we release her.    Mike Craze, NP [place] [phone]

## 2017-11-17 NOTE — Progress Notes (Signed)
Letter from [place] D. PAC at [facility] stating that we are to discontinue aranesp injections moving forward as patient is now on dialysis.    Mike Craze, NP aware and noted for patient to receive ESA therapy and anemia management with nephrology moving forward.  No future appointments or labs needed at [facility].    Patient's husband aware.

## 2017-11-18 ENCOUNTER — Other Ambulatory Visit: Payer: Self-pay

## 2017-11-18 ENCOUNTER — Encounter: Payer: Self-pay | Admitting: Internal Medicine

## 2017-11-18 ENCOUNTER — Other Ambulatory Visit (HOSPITAL_COMMUNITY): Payer: Medicare Other

## 2017-11-18 ENCOUNTER — Ambulatory Visit (HOSPITAL_COMMUNITY): Payer: Medicare Other

## 2017-11-18 ENCOUNTER — Non-Acute Institutional Stay (SKILLED_NURSING_FACILITY): Payer: Medicare Other | Admitting: Internal Medicine

## 2017-11-18 DIAGNOSIS — E1122 Type 2 diabetes mellitus with diabetic chronic kidney disease: Secondary | ICD-10-CM

## 2017-11-18 DIAGNOSIS — J441 Chronic obstructive pulmonary disease with (acute) exacerbation: Secondary | ICD-10-CM | POA: Diagnosis not present

## 2017-11-18 DIAGNOSIS — N939 Abnormal uterine and vaginal bleeding, unspecified: Secondary | ICD-10-CM

## 2017-11-18 DIAGNOSIS — N184 Chronic kidney disease, stage 4 (severe): Secondary | ICD-10-CM | POA: Diagnosis not present

## 2017-11-18 DIAGNOSIS — Z794 Long term (current) use of insulin: Secondary | ICD-10-CM | POA: Diagnosis not present

## 2017-11-18 DIAGNOSIS — I4892 Unspecified atrial flutter: Secondary | ICD-10-CM

## 2017-11-18 DIAGNOSIS — R1031 Right lower quadrant pain: Secondary | ICD-10-CM

## 2017-11-18 MED ORDER — HYDROCODONE-ACETAMINOPHEN 5-325 MG PO TABS: ORAL_TABLET | ORAL | 0 refills | Status: DC

## 2017-11-18 NOTE — Progress Notes (Signed)
Location:   [facility] Room Number: 212/Y Place of Service:  SNF [phone]) Provider:  Lilia Pro, MD  Patient Care Team: Sharilyn Sites, MD as PCP - General (Family Medicine) Satira Sark, MD as Consulting Physician (Cardiology) Donato Heinz, MD as Consulting Physician (Nephrology) Corliss Parish, MD as Consulting Physician (Nephrology) Early, Arvilla Meres, MD as Consulting Physician (Vascular Surgery) Farrel Gobble, MD (Inactive) as Consulting Physician (Hematology and Oncology) [name], MD as Consulting Physician (Gastroenterology)  Extended Emergency Contact Information Primary Emergency Contact: [facility] Address: [address] [place]          [place], [place] [address] [place] Phone: [phone] Mobile Phone: [phone] Relation: Spouse  Code Status:  Full Code Goals of care: Advanced Directive information Advanced Directives 11/16/2017  Does Patient Have a Medical Advance Directive? Yes  Type of Advance Directive (No Data)  Does patient want to make changes to medical advance directive? No - Patient declined  Copy of [place] in Chart? No - copy requested  Would patient like information on creating a medical advance directive? No - Patient declined  Pre-existing out of facility DNR order (yellow form or pink MOST form) -    Chief complaint-acute visit secondary to vaginal bleeding   HPI:  Pt is a 72 y.o. female seen today for an acute visit for follow-up of vaginal bleeding  She was recently hospitalized for pneumonia and COPD exasperation.  She also has a history of end-stage renal disease and was started on dialysis during her hospital admission-as well as COPD on chronic prednisone- diabetes-hyperlipidemia-chronic diastolic CHF-chronic atrial fibrillation on Coumadin and systemic lupus and hypertension.  In the hospital for pneumonia was treated with IV Zosyn she  was also RSV positive.  She was volume overloaded did not respond to IV Lasix and then was started on dialysis.  She also required some prednisone boluses because of COPD exasperation was apparently improved after her dialysis.  She does not report any previous history of vaginal bleeding-she does have a history of hysterectomy in the past  Of note she is on Coumadin with a history of atrial fibrillation-INR was elevated at 3.16 yesterday-update INR is pending for tomorrow and her Coumadin is currently on hold  She continues to complain of some right-sided abdominal pain which apparently has been present for some time it is not gotten any better or worse according to patient it appears this was addressed in the hospital per speaking with patient and her husband-- I do see an ultrasound was done of her abdomen that showed gallbladder sludge without stones or biliary dilation-it did show an i indeterminate right renal lesion that could represent a cyst  A CT scan also showed sludge or gallstones in the gallbladder no acute bowel pathology no obstruction or inflammatory finding-   Of note per nursing apparently during therapy today her O2 sats did drop into the 80s on oxygen quickly rose into the high 90s-she did not report any increased shortness of breath or cough from baseline thinks she is doing well in therapy and feels her cough and breathing is slowly improving--. Per staff there is some suspicion her fingers were cold which led to the initial low O2 sat         Past Medical History:  Diagnosis Date  . Anemia   . Arthritis   . Asthma   . Atrial fibrillation ([place])   . Atrial flutter ([place])  TEE/DCCV December 2013 - on Xarelto [facility])  . Bone spur of toe    Right 5th toe  . Chronic diastolic CHF (congestive heart failure) (HCC)    a. EF 50-55% by echo in 2015  . CKD (chronic kidney disease) stage 4, GFR 15-29 ml/min (HCC)    a. s/p fistula placement but not on HD. Followed by  Nephrology.   Marland Kitchen COPD (chronic obstructive pulmonary disease) ([place])   . DVT of upper extremity (deep vein thrombosis) ([place])    Right basilic vein, June 7062  . Essential hypertension, benign   . Fractures   . SLE (systemic lupus erythematosus) ([place])   . Type 2 diabetes mellitus ([place])   . UTI (lower urinary tract infection)    Past Surgical History:  Procedure Laterality Date  . ABDOMINAL HYSTERECTOMY  1983  . ANKLE SURGERY  1993  . AV FISTULA PLACEMENT Left 03/27/2014   Procedure: ARTERIOVENOUS (AV) FISTULA CREATION;  Surgeon: Rosetta Posner, MD;  Location: [facility];  Service: Vascular;  Laterality: Left;  . BACK SURGERY  1980  . CARDIOVASCULAR STRESS TEST  12/19/2009   no stress induced rhythm abnormalities, ekg negative for ischemia  . CARDIOVERSION  09/16/2012   Procedure: CARDIOVERSION;  Surgeon: Sanda Klein, MD;  Location: [facility]COPY;  Service: Cardiovascular;  Laterality: N/A;  . COLONOSCOPY  09/20/2012   Dr. [name]: multiple tubular adenomas   . COLONOSCOPY  2005   Dr. [name]. Polyps, path unknown   . COLONOSCOPY N/A 07/24/2016   Procedure: COLONOSCOPY;  Surgeon: [name], MD;  [facility];  Service: Endoscopy;  Laterality: N/A;  9:00 am  . ESOPHAGOGASTRODUODENOSCOPY  09/20/2012   Dr. [name]: duodenal erosion and possible resolving ulcer at the angularis   . IR FLUORO GUIDE CV LINE RIGHT  11/09/2017  . IR US GUIDE VASC ACCESS RIGHT  11/09/2017  . TEE WITHOUT CARDIOVERSION  09/16/2012   Procedure: TRANSESOPHAGEAL ECHOCARDIOGRAM (TEE);  Surgeon: Sanda Klein, MD;  Location: [facility] ENDOSCOPY;  Service: Cardiovascular;  Laterality: N/A;  . TRANSESOPHAGEAL ECHOCARDIOGRAM WITH CARDIOVERSION  09/16/2012   EF 60-65%, moderate LVH, moderate regurg of the aortic valve, LA moderately dilated    Allergies  Allergen Reactions  . Ace Inhibitors Cough    Outpatient Encounter Medications as of 11/18/2017  Medication Sig  . acetaminophen (TYLENOL) 500 MG tablet Take 1,000  mg by mouth every 6 (six) hours as needed. For pain  . albuterol (ACCUNEB) 0.63 MG/3ML nebulizer solution Take 1 ampule by nebulization every 6 (six) hours as needed for wheezing or shortness of breath.   Marland Kitchen albuterol (PROVENTIL HFA;VENTOLIN HFA) 108 (90 BASE) MCG/ACT inhaler Inhale 2 puffs into the lungs every 6 (six) hours as needed. For shortness of breath  . amLODipine (NORVASC) 10 MG tablet Take 1 tablet (10 mg total) by mouth at bedtime.  Marland Kitchen atorvastatin (LIPITOR) 40 MG tablet Take 40 mg at bedtime by mouth.   . brimonidine (ALPHAGAN) 0.2 % ophthalmic solution Place 1 drop into the left eye daily.  . brimonidine-timolol (COMBIGAN) 0.2-0.5 % ophthalmic solution Place 1 drop into the left eye daily.   . calcitRIOL (ROCALTROL) 0.25 MCG capsule Take 0.25 mcg by mouth as directed. Take on Mondays Wednesdays and Fridays  . calcium acetate (PHOSLO) 667 MG capsule Take 2 capsules (1,334 mg total) by mouth 3 (three) times daily with meals.  . diclofenac (VOLTAREN) 0.1 % ophthalmic solution Place 1 drop into the left eye daily. Uses at lunch time  . dicyclomine (BENTYL)  10 MG capsule Take 10 mg by mouth 4 (four) times daily -  before meals and at bedtime.  . febuxostat (ULORIC) 40 MG tablet Take 40 mg by mouth daily.  . fluconazole (DIFLUCAN) 100 MG tablet Take 100 mg by mouth every evening.  . folic acid (FOLVITE) 1 MG tablet Take 1 mg by mouth daily.  . furosemide (LASIX) 80 MG tablet Take 160 mg by mouth 2 (two) times daily.   Marland Kitchen HYDROcodone-acetaminophen (NORCO/VICODIN) 5-325 MG tablet 1 or 2 tabs PO q12 hours prn pain  . hydroxychloroquine (PLAQUENIL) 200 MG tablet Take 300 mg by mouth as directed. Take 1 1/2 tablets daily  . Insulin Detemir (LEVEMIR FLEXTOUCH) 100 UNIT/ML Pen Inject 10 Units into the skin daily at 10 pm.  . insulin lispro (HUMALOG KWIKPEN) 100 UNIT/ML KiwkPen Give per sliding scale from 2-10 units subcutaneous three times a day.  . metoprolol tartrate (LOPRESSOR) 50 MG tablet TAKE  ONE TABLET BY MOUTH TWICE DAILY.  . Multiple Vitamins-Minerals (CENTRUM SILVER PO) Take 1 tablet by mouth daily.  Marland Kitchen nystatin (MYCOSTATIN) 100000 UNIT/ML suspension Nystatin 5 ml PO swish and swallow QID x 7 days  . ONE TOUCH ULTRA TEST test strip TEST 4 TIMES A DAY AS DIRECTED.  Marland Kitchen ONETOUCH DELICA LANCETS 07P MISC USE TO TEST 4 TIMES DAILY.  Marland Kitchen predniSONE (DELTASONE) 5 MG tablet Take 5 mg by mouth daily with breakfast.  . timolol (TIMOPTIC) 0.5 % ophthalmic solution Place 1 drop into the left eye daily.  . travoprost, benzalkonium, (TRAVATAN) 0.004 % ophthalmic solution Place 1 drop into the left eye at bedtime.  Marland Kitchen UNIFINE PENTIPS 31G X 6 MM MISC USE AS DIRECTED AT BEDTIME WITH LEVEMIR AND WITH SLIDING SCALE INSULIN.  Marland Kitchen Vitamin D, Ergocalciferol, (DRISDOL) 50000 units CAPS capsule TAKE 1 CAPSULE BY MOUTH ONCE WEEKLY.  . [DISCONTINUED] warfarin (COUMADIN) 5 MG tablet Take 5 mg by mouth daily.   No facility-administered encounter medications on file as of 11/18/2017.     Review of Systems   General is not complaining of fever chills says she is feeling better.  Skin does not complain of rashes or itching.  Head ears eyes nose mouth and throat is being treated for thrush which has improved but still complains of some mouth soreness---  Respiratory feels her cough is improving as well as breathing status-again she did have a short episode of decreased O2 sats doing therapy.  Cardiac is not complaining of chest pain or palpitations.  GI does not plan of nausea or vomiting at this time continues to complain of some right-sided abdominal discomfort does not complain of constipation or diarrhea.  Musculoskeletal is not complaining of joint pain currently    GU is not complaining of dysuria and there is end-stage renal disease on dialysis--staff and patient did note some vaginal bleeding earlier today.  Months neurologic is not complaining of dizziness headache or numbness.  Psych appears to  be in good spirits is not complaining of overt anxiety or depression    Immunization History  Administered Date(s) Administered  . Influenza,inj,Quad PF,6+ Mos 07/03/2014  . Influenza-Unspecified 07/11/2015, 07/08/2016  . PPD Test 11/07/2017  . Pneumococcal Polysaccharide-23 11/04/2017   Pertinent  Health Maintenance Due  Topic Date Due  . FOOT EXAM  12/14/2017 (Originally 11/27/1955)  . OPHTHALMOLOGY EXAM  12/14/2017 (Originally 03/28/2016)  . URINE MICROALBUMIN  12/14/2017 (Originally 11/27/1955)  . DEXA SCAN  12/14/2017 (Originally 11/26/2010)  . INFLUENZA VACCINE  07/08/2018 (Originally 04/29/2017)  . HEMOGLOBIN  A1C  04/19/2018  . PNA vac Low Risk Adult (2 of 2 - PCV13) 11/04/2018  . MAMMOGRAM  09/29/2019  . COLONOSCOPY  07/24/2026   Fall Risk  10/27/2017 07/24/2017 01/16/2017 07/17/2016 04/09/2016  Falls in the past year? _0    Functional Status Survey:    Vitals:   11/18/17 1115  BP: (!) 132/54  Pulse: 74  Resp: 20  Temp: 99.5 F (37.5 C)  TempSrc: Oral    Physical Exam       In general this is a pleasant elderly female in no distress sitting comfortably in her wheelchair   Her skin is warm and dry.--She does have a covering over a right lower extremity wound on the back of her leg  Oropharynx thrush appears to be improved mucous membranes are moist.  Chest is clear to auscultation there is no labored breathing somewhat shallow air entry.  Heart is regular rate and rhythm she does have some mild lower extremity edema which I suspect is baseline.  Abdomen is soft some tenderness on the right lower side which apparently has some chronicity to it bowel sounds are active.  GU I cannot appreciate any bleeding of the vaginal area on exam-apparently had been some enough to spot her diaper earlier today.  Musculoskeletal is able to move all extremities x4 does need assistance with transfers-still has some lower extremity weakness.  Neurologic appears to  be grossly intact her speech is clear she is alert cranial nerves appear to be intact.  Psych she is alert and oriented pleasant and appropriate  Labs reviewed: Recent Labs    11/06/17 0547  11/09/17 0459 11/10/17 0507 11/11/17 0415  11/13/17 0454 11/15/17 0500 11/17/17 0706  NA 134*   < > 138 141 136   < > 136 137 135  K 4.1   < > 4.0 3.9 3.8   < > 3.7 3.5 3.5  CL 99*   < > 100* 100* 96*   < > 96* 97* 97*  CO2 20*   < > _1 < > _2 GLUCOSE 155*   < > 442* 185* 334*   < > 171* 193* 183*  BUN 128*   < > 152* 97* 69*   < > 60* 94* 41*  CREATININE 4.56*   < > 4.08* 2.87* 2.85*   < > 3.13* 4.52* 3.21*  CALCIUM 8.6*   < > 8.7* 8.5* 8.3*   < > 8.3* 8.4* 7.8*  MG 2.4  --   --   --   --   --   --   --   --   PHOS 7.0*   < > 5.5* 4.4 4.2  --   --   --   --    < > = values in this interval not displayed.   Recent Labs    10/20/17 1029 10/21/17 1027 11/02/17 1536  11/09/17 0459 11/10/17 0507 11/11/17 0415  AST 24 28 37  --   --   --   --   ALT _3 --   --   --   --   ALKPHOS 69 56 108  --   --   --   --   BILITOT 0.2 0.3 0.6  --   --   --   --   PROT 6.6 7.1 7.4  --   --   --   --   ALBUMIN 3.3* 2.8*  2.3*   < > 2.0* 2.3* 2.1*   < > = values in this interval not displayed.   Recent Labs    11/02/17 1536  11/13/17 0454 11/15/17 0500 11/17/17 0706  WBC 17.7*   < > 17.1* 15.5* 11.5*  NEUTROABS 16.3*  --   --  13.1* 9.7*  HGB 10.1*   < > 11.2* 10.4* 9.5*  HCT 32.2*   < > 36.8 33.4* 30.8*  MCV 87.5   < > 89.5 89.3 90.3  PLT 262   < > 355 277 222   < > = values in this interval not displayed.   Lab Results  Component Value Date   TSH 2.360 09/19/2015   Lab Results  Component Value Date   HGBA1C 5.6 10/20/2017   No results found for: CHOL, HDL, LDLCALC, LDLDIRECT, TRIG, CHOLHDL  Significant Diagnostic Results in last 30 days:  Ct Abdomen Pelvis Wo Contrast  Result Date: 11/02/2017 CLINICAL DATA:  Emesis for the last 3 days.  Intermittent fever.  EXAM: CT ABDOMEN AND PELVIS WITHOUT CONTRAST TECHNIQUE: Multidetector CT imaging of the abdomen and pelvis was performed following the standard protocol without IV contrast. COMPARISON:  09/09/2017.  08/31/2017. FINDINGS: Lower chest: There is a small left effusion. There is segmental consolidation in the left lower lobe suggesting pneumonia. The heart is mildly enlarged. Hepatobiliary: No liver parenchymal abnormality is seen. There is some layering sludge or stones in the gallbladder. No CT evidence of cholecystitis. Pancreas: Normal Spleen: Normal Adrenals/Urinary Tract: Adrenal glands are normal. Kidneys show vascular calcifications but no acute finding. Bladder appears normal. Stomach/Bowel: No acute bowel pathology is seen. No sign of obstruction or inflammatory disease. Vascular/Lymphatic: Advanced aortic atherosclerosis. Atherosclerosis of the aortic branch vessels. IVC is normal. No retroperitoneal adenopathy. Reproductive: Previous hysterectomy.  No pelvic mass. Other: No free fluid or air. Musculoskeletal: Chronic lumbar degenerative changes. No acute bone finding. IMPRESSION: Segmental consolidation in the left lower lobe consistent with bronchopneumonia. Small amount of left pleural fluid. Sludge or gallstones in the gallbladder. Aortic atherosclerosis. No acute bowel pathology seen. No obstruction or inflammatory finding. Electronically Signed   By: Nelson Chimes M.D.   On: 11/02/2017 18:28   Dg Tibia/fibula Right  Result Date: 11/02/2017 CLINICAL DATA:  Right lower leg ulcers. EXAM: RIGHT TIBIA AND FIBULA - 2 VIEW COMPARISON:  None. FINDINGS: There is no evidence of fracture or other focal bone lesions. Surgical screw is seen in distal right fibula. Vascular calcifications are noted. No lytic destruction is seen to suggest osteomyelitis. IMPRESSION: No fracture or dislocation is noted. No definite evidence of osteomyelitis. Electronically Signed   By: Marijo Conception, M.D.   On: 11/02/2017 17:30    US Abdomen Complete  Result Date: 11/03/2017 CLINICAL DATA:  Emesis for 3 days and intermittent fever. Chronic abdominal pain. EXAM: ABDOMEN ULTRASOUND COMPLETE COMPARISON:  Abdominal CT 11/02/2017 FINDINGS: Gallbladder: Small amount of echogenic sludge within the gallbladder. No gallbladder wall distention or wall thickening. Reportedly, the patient does not have a sonographic Murphy sign. No gallstones. Common bile duct: Diameter: 0.4 cm. Liver: No focal lesion identified. Within normal limits in parenchymal echogenicity. Portal vein is patent on color Doppler imaging with normal direction of blood flow towards the liver. IVC: No abnormality visualized. Pancreas: Visualized portion unremarkable. Spleen: Size and appearance within normal limits. Right Kidney: Length: 11.6 cm. Increased echogenicity in the right kidney without hydronephrosis. There is a indeterminate hypoechoic structure along the lateral upper pole region. This structure measures 2.6  x 2.4 x 2.8 cm. Structure may be partially cystic but cannot exclude a solid component. Review of the prior non contrast CT demonstrates minimal fullness in this area but there is not an obvious lesion based on the non contrast images. Left Kidney: Length: 10.1 cm. Echogenicity within normal limits. No mass or hydronephrosis visualized. Abdominal aorta: No aneurysm visualized. Other findings: None. IMPRESSION: Indeterminate right renal lesion measuring up to 2.8 cm. This could represent a complex cystic lesion but cannot exclude a solid component. This lesion is not well characterized on the prior non contrast CTs. Patient is not a candidate for contrast enhanced studies due to renal function but this lesion could be further characterized with a non contrast renal MRI. Gallbladder sludge without stones or biliary dilatation. Electronically Signed   By: Markus Daft M.D.   On: 11/03/2017 11:50   Ir Fluoro Guide Cv Line Right  Result Date: 11/09/2017 INDICATION:  Acute on chronic renal failure. Please place tunneled dialysis catheter for the initiation of dialysis. EXAM: TUNNELED CENTRAL VENOUS HEMODIALYSIS CATHETER PLACEMENT WITH ULTRASOUND AND FLUOROSCOPIC GUIDANCE MEDICATIONS: Ancef 2 gm IV . The antibiotic was given in an appropriate time interval prior to skin puncture. ANESTHESIA/SEDATION: Versed 1.5 mg IV; Fentanyl 50 mcg IV; Moderate Sedation Time: 13 minutes The patient was continuously monitored during the procedure by the interventional radiology nurse under my direct supervision. FLUOROSCOPY TIME:  Fluoroscopy Time: 30 seconds (3 mGy). COMPLICATIONS: None immediate. PROCEDURE: Informed written consent was obtained from the patient after a discussion of the risks, benefits, and alternatives to treatment. Questions regarding the procedure were encouraged and answered. The right neck and chest were prepped with chlorhexidine in a sterile fashion, and a sterile drape was applied covering the operative field. Maximum barrier sterile technique with sterile gowns and gloves were used for the procedure. A timeout was performed prior to the initiation of the procedure. After creating a small venotomy incision, a micropuncture kit was utilized to access the internal jugular vein. Real-time ultrasound guidance was utilized for vascular access including the acquisition of a permanent ultrasound image documenting patency of the accessed vessel. The microwire was utilized to measure appropriate catheter length. A stiff Glidewire was advanced to the level of the IVC and the micropuncture sheath was exchanged for a peel-away sheath. A palindrome tunneled hemodialysis catheter measuring 19 cm from tip to cuff was tunneled in a retrograde fashion from the anterior chest wall to the venotomy incision. The catheter was then placed through the peel-away sheath with tips ultimately positioned within the superior aspect of the right atrium. Final catheter positioning was confirmed and  documented with a spot radiographic image. The catheter aspirates and flushes normally. The catheter was flushed with appropriate volume heparin dwells. The catheter exit site was secured with a 0-Prolene retention suture. The venotomy incision was closed with an interrupted 4-0 Vicryl, Dermabond and Steri-strips. Dressings were applied. The patient tolerated the procedure well without immediate post procedural complication. IMPRESSION: Successful placement of 19 cm tip to cuff tunneled hemodialysis catheter via the right internal jugular vein with tips terminating within the superior aspect of the right atrium. The catheter is ready for immediate use. Electronically Signed   By: Sandi Mariscal M.D.   On: 11/09/2017 14:01   Ir US Guide Vasc Access Right  Result Date: 11/09/2017 INDICATION: Acute on chronic renal failure. Please place tunneled dialysis catheter for the initiation of dialysis. EXAM: TUNNELED CENTRAL VENOUS HEMODIALYSIS CATHETER PLACEMENT WITH ULTRASOUND AND FLUOROSCOPIC GUIDANCE MEDICATIONS: Ancef  2 gm IV . The antibiotic was given in an appropriate time interval prior to skin puncture. ANESTHESIA/SEDATION: Versed 1.5 mg IV; Fentanyl 50 mcg IV; Moderate Sedation Time: 13 minutes The patient was continuously monitored during the procedure by the interventional radiology nurse under my direct supervision. FLUOROSCOPY TIME:  Fluoroscopy Time: 30 seconds (3 mGy). COMPLICATIONS: None immediate. PROCEDURE: Informed written consent was obtained from the patient after a discussion of the risks, benefits, and alternatives to treatment. Questions regarding the procedure were encouraged and answered. The right neck and chest were prepped with chlorhexidine in a sterile fashion, and a sterile drape was applied covering the operative field. Maximum barrier sterile technique with sterile gowns and gloves were used for the procedure. A timeout was performed prior to the initiation of the procedure. After creating a  small venotomy incision, a micropuncture kit was utilized to access the internal jugular vein. Real-time ultrasound guidance was utilized for vascular access including the acquisition of a permanent ultrasound image documenting patency of the accessed vessel. The microwire was utilized to measure appropriate catheter length. A stiff Glidewire was advanced to the level of the IVC and the micropuncture sheath was exchanged for a peel-away sheath. A palindrome tunneled hemodialysis catheter measuring 19 cm from tip to cuff was tunneled in a retrograde fashion from the anterior chest wall to the venotomy incision. The catheter was then placed through the peel-away sheath with tips ultimately positioned within the superior aspect of the right atrium. Final catheter positioning was confirmed and documented with a spot radiographic image. The catheter aspirates and flushes normally. The catheter was flushed with appropriate volume heparin dwells. The catheter exit site was secured with a 0-Prolene retention suture. The venotomy incision was closed with an interrupted 4-0 Vicryl, Dermabond and Steri-strips. Dressings were applied. The patient tolerated the procedure well without immediate post procedural complication. IMPRESSION: Successful placement of 19 cm tip to cuff tunneled hemodialysis catheter via the right internal jugular vein with tips terminating within the superior aspect of the right atrium. The catheter is ready for immediate use. Electronically Signed   By: Sandi Mariscal M.D.   On: 11/09/2017 14:01   Dg Chest Port 1 View  Result Date: 11/03/2017 CLINICAL DATA:  Respiratory failure EXAM: PORTABLE CHEST 1 VIEW COMPARISON:  November 12, 2017 FINDINGS: There is stable cardiomegaly with mild pulmonary venous hypertension. There is atelectatic change in the left base. No frank edema or consolidation evident. There is aortic atherosclerosis. No bone lesions appreciable. IMPRESSION: Evidence of a degree of  pulmonary vascular congestion without frank edema or consolidation. There is left base atelectasis. There is aortic atherosclerosis. Aortic Atherosclerosis (ICD10-I70.0). Electronically Signed   By: Lowella Grip III M.D.   On: 11/03/2017 08:37   Dg Chest Port 1 View  Result Date: 11/02/2017 CLINICAL DATA:  Shortness of breath and cough. EXAM: PORTABLE CHEST 1 VIEW COMPARISON:  August 13, 2017 FINDINGS: Stable cardiomegaly. Mild edema. No other interval changes or acute abnormalities. IMPRESSION: Cardiomegaly and mild edema. Electronically Signed   By: Dorise Bullion III M.D   On: 11/02/2017 19:11    Assessment/Plan #1 vaginal bleeding-patient denies prior history of this-she is status post hysterectomy- her INR was somewhat supratherapeutic yesterday which may be contributing to this-her Coumadin is currently on hold update lab work including an INR and CBC pending for tomorrow.  Also will obtain a urinalysis and culture-this was discussed with Dr. Lyndel Safe via phone   It appears her hemoglobin has dropped slightly down to  9.5 previously had been in the 10-11 range it looks like-will await updated CBC---clinically she appears to be stable and doing relatively well.  2.  History of acute hypoxia thought secondary to COPD exasperation pneumonia she has completed antibiotics she feels she is doing better in this regards she did have a short episode of relatively asymptomatic hypoxia doing therapy today but oxygen saturations quickly normalized on oxygen-she says her cough is getting better as well as breathing-at this point will continue to monitor closely with vital signs pulse ox every shift-she does continue on chronic prednisone as well as albuterol nebulizers every 6 hours as needed-Proventil inhalers every 6 hours as needed--this was discussed with Dr. Lyndel Safe via phone  #3 history of end-stage renal disease she is now receiving dialysis and appears to be tolerating this fairly well nephrology  is following her.  4.  History of type 2 diabetes continues on Levemir as well as sliding scale insulin-blood sugars appear to be fairly stable in the mid 100s in the morning-at times will have somewhat more variable readings later in the day ranging from the 80s to over 200 but I do not consistent highs or lows at this point will monitor--  #4 hypertension this appears to be stable on Norvasc.  5.  History of diastolic CHF he has been started on dialysis she is on Lasix this appears relatively stable.  6.  History of atrial fibrillation she had been on Coumadin this is being held because of a supratherapeutic INR which may be contributing to the vaginal bleeding-update INR is pending for tomorrow-she is on Lopressor for rate control.  7.  History of oral thrush she has been started on Diflucan this appears to be improving today   #8 history of abdominal pain I have reviewed the ultrasound and CT scan results-.  She was thought to possibly have some gastroenteritis in the hospital-she says the abdominal pain is not gotten any better or worse since her hospitalization she says is not really affecting her appetite she says more of the mouth discomfort from the thrush is affecting  That  This was discussed with Dr. Lyndel Safe via phone and will add liver function test to the labs already ordered for tomorrow  #9 anemia-hemoglobin has slowly dropped-update CBC is pending for tomorrow- this appears to be a normocytic anemia-suspect there may be an element of chronic kidney disease contributing to this      [id number]-of note greater than 35 minutes spent assessing patient-discussing her status with nursing staff-reviewing her chart labs imaging studies--and coordinating and formulating a plan of care for numerous diagnoses- of note greater than 50% of time spent coordinating plan of care

## 2017-11-18 NOTE — Telephone Encounter (Signed)
RX Fax for [facility]@ [phone]  

## 2017-11-19 ENCOUNTER — Encounter (HOSPITAL_COMMUNITY)
Admission: RE | Admit: 2017-11-19 | Discharge: 2017-11-19 | Disposition: A | Discharge status: Home / Self Care | Payer: Medicare Other | Source: Skilled Nursing Facility | Attending: Internal Medicine | Admitting: Internal Medicine

## 2017-11-19 DIAGNOSIS — I4892 Unspecified atrial flutter: Secondary | ICD-10-CM | POA: Insufficient documentation

## 2017-11-19 DIAGNOSIS — D631 Anemia in chronic kidney disease: Secondary | ICD-10-CM | POA: Insufficient documentation

## 2017-11-19 DIAGNOSIS — E1129 Type 2 diabetes mellitus with other diabetic kidney complication: Secondary | ICD-10-CM | POA: Insufficient documentation

## 2017-11-19 DIAGNOSIS — D509 Iron deficiency anemia, unspecified: Secondary | ICD-10-CM | POA: Insufficient documentation

## 2017-11-19 DIAGNOSIS — E785 Hyperlipidemia, unspecified: Secondary | ICD-10-CM | POA: Insufficient documentation

## 2017-11-19 DIAGNOSIS — I10 Essential (primary) hypertension: Secondary | ICD-10-CM | POA: Insufficient documentation

## 2017-11-19 LAB — URINALYSIS, ROUTINE W REFLEX MICROSCOPIC
BILIRUBIN URINE: NEGATIVE
GLUCOSE, UA: NEGATIVE mg/dL
Hgb urine dipstick: NEGATIVE
Ketones, ur: NEGATIVE mg/dL
Nitrite: NEGATIVE
Protein, ur: NEGATIVE mg/dL
SPECIFIC GRAVITY, URINE: 1.021 (ref 1.005–1.030)
pH: 5 (ref 5.0–8.0)

## 2017-11-19 LAB — CBC
HEMATOCRIT: 27.6 % — AB (ref 36.0–46.0)
Hemoglobin: 8.4 g/dL — ABNORMAL LOW (ref 12.0–15.0)
MCH: 28 pg (ref 26.0–34.0)
MCHC: 30.4 g/dL (ref 30.0–36.0)
MCV: 92 fL (ref 78.0–100.0)
Platelets: 200 10*3/uL (ref 150–400)
RBC: 3 MIL/uL — AB (ref 3.87–5.11)
RDW: 18.3 % — ABNORMAL HIGH (ref 11.5–15.5)
WBC: 9.5 10*3/uL (ref 4.0–10.5)

## 2017-11-19 LAB — PROTIME-INR
INR: 3.97
Prothrombin Time: 38.5 seconds — ABNORMAL HIGH (ref 11.4–15.2)

## 2017-11-19 LAB — BASIC METABOLIC PANEL
ANION GAP: 10 (ref 5–15)
BUN: 29 mg/dL — AB (ref 6–20)
CO2: 28 mmol/L (ref 22–32)
Calcium: 8.4 mg/dL — ABNORMAL LOW (ref 8.9–10.3)
Chloride: 98 mmol/L — ABNORMAL LOW (ref 101–111)
Creatinine, Ser: 3.93 mg/dL — ABNORMAL HIGH (ref 0.44–1.00)
GFR calc Af Amer: 12 mL/min — ABNORMAL LOW (ref >60)
GFR calc non Af Amer: 11 mL/min — ABNORMAL LOW (ref >60)
GLUCOSE: 89 mg/dL (ref 65–99)
POTASSIUM: 3.8 mmol/L (ref 3.5–5.1)
Sodium: 136 mmol/L (ref 135–145)

## 2017-11-20 DIAGNOSIS — E876 Hypokalemia: Secondary | ICD-10-CM | POA: Diagnosis not present

## 2017-11-20 DIAGNOSIS — D631 Anemia in chronic kidney disease: Secondary | ICD-10-CM | POA: Diagnosis not present

## 2017-11-20 DIAGNOSIS — E1129 Type 2 diabetes mellitus with other diabetic kidney complication: Secondary | ICD-10-CM | POA: Diagnosis not present

## 2017-11-20 DIAGNOSIS — E039 Hypothyroidism, unspecified: Secondary | ICD-10-CM | POA: Diagnosis not present

## 2017-11-20 DIAGNOSIS — N2581 Secondary hyperparathyroidism of renal origin: Secondary | ICD-10-CM | POA: Diagnosis not present

## 2017-11-20 DIAGNOSIS — D689 Coagulation defect, unspecified: Secondary | ICD-10-CM | POA: Diagnosis not present

## 2017-11-20 DIAGNOSIS — N186 End stage renal disease: Secondary | ICD-10-CM | POA: Diagnosis not present

## 2017-11-20 LAB — URINE CULTURE: CULTURE: NO GROWTH

## 2017-11-21 ENCOUNTER — Inpatient Hospital Stay
Admission: RE | Admit: 2017-11-21 | Discharge: 2017-12-04 | Disposition: A | Discharge status: Home / Self Care | Payer: Medicare Other | Source: Ambulatory Visit | Attending: Internal Medicine | Admitting: Internal Medicine

## 2017-11-21 ENCOUNTER — Encounter (HOSPITAL_COMMUNITY)
Admission: RE | Admit: 2017-11-21 | Discharge: 2017-11-21 | Disposition: A | Discharge status: Home / Self Care | Payer: Medicare Other | Source: Skilled Nursing Facility | Attending: Internal Medicine | Admitting: Internal Medicine

## 2017-11-21 ENCOUNTER — Emergency Department (HOSPITAL_COMMUNITY)
Admission: EM | Admit: 2017-11-21 | Discharge: 2017-11-21 | Disposition: A | Discharge status: Other Acute Inpatient Hospital | Payer: Medicare Other | Attending: Emergency Medicine | Admitting: Emergency Medicine

## 2017-11-21 ENCOUNTER — Encounter (HOSPITAL_COMMUNITY): Payer: Self-pay | Admitting: Emergency Medicine

## 2017-11-21 ENCOUNTER — Other Ambulatory Visit: Payer: Self-pay

## 2017-11-21 DIAGNOSIS — D649 Anemia, unspecified: Secondary | ICD-10-CM | POA: Diagnosis not present

## 2017-11-21 DIAGNOSIS — Z87891 Personal history of nicotine dependence: Secondary | ICD-10-CM | POA: Diagnosis not present

## 2017-11-21 DIAGNOSIS — D631 Anemia in chronic kidney disease: Secondary | ICD-10-CM | POA: Insufficient documentation

## 2017-11-21 DIAGNOSIS — T148XXA Other injury of unspecified body region, initial encounter: Secondary | ICD-10-CM

## 2017-11-21 DIAGNOSIS — I132 Hypertensive heart and chronic kidney disease with heart failure and with stage 5 chronic kidney disease, or end stage renal disease: Secondary | ICD-10-CM | POA: Diagnosis not present

## 2017-11-21 DIAGNOSIS — Z992 Dependence on renal dialysis: Secondary | ICD-10-CM | POA: Insufficient documentation

## 2017-11-21 DIAGNOSIS — R195 Other fecal abnormalities: Secondary | ICD-10-CM | POA: Diagnosis not present

## 2017-11-21 DIAGNOSIS — N2581 Secondary hyperparathyroidism of renal origin: Secondary | ICD-10-CM | POA: Diagnosis not present

## 2017-11-21 DIAGNOSIS — Y93E1 Activity, personal bathing and showering: Secondary | ICD-10-CM | POA: Insufficient documentation

## 2017-11-21 DIAGNOSIS — I5032 Chronic diastolic (congestive) heart failure: Secondary | ICD-10-CM | POA: Diagnosis not present

## 2017-11-21 DIAGNOSIS — D689 Coagulation defect, unspecified: Secondary | ICD-10-CM | POA: Diagnosis not present

## 2017-11-21 DIAGNOSIS — S40022A Contusion of left upper arm, initial encounter: Secondary | ICD-10-CM | POA: Diagnosis not present

## 2017-11-21 DIAGNOSIS — Y92128 Other place in nursing home as the place of occurrence of the external cause: Secondary | ICD-10-CM | POA: Insufficient documentation

## 2017-11-21 DIAGNOSIS — N184 Chronic kidney disease, stage 4 (severe): Secondary | ICD-10-CM | POA: Diagnosis not present

## 2017-11-21 DIAGNOSIS — E039 Hypothyroidism, unspecified: Secondary | ICD-10-CM | POA: Diagnosis not present

## 2017-11-21 DIAGNOSIS — N186 End stage renal disease: Secondary | ICD-10-CM | POA: Diagnosis not present

## 2017-11-21 DIAGNOSIS — E1122 Type 2 diabetes mellitus with diabetic chronic kidney disease: Secondary | ICD-10-CM | POA: Diagnosis not present

## 2017-11-21 DIAGNOSIS — Y999 Unspecified external cause status: Secondary | ICD-10-CM | POA: Insufficient documentation

## 2017-11-21 DIAGNOSIS — Z794 Long term (current) use of insulin: Secondary | ICD-10-CM | POA: Insufficient documentation

## 2017-11-21 DIAGNOSIS — E876 Hypokalemia: Secondary | ICD-10-CM | POA: Diagnosis not present

## 2017-11-21 DIAGNOSIS — E1129 Type 2 diabetes mellitus with other diabetic kidney complication: Secondary | ICD-10-CM | POA: Diagnosis not present

## 2017-11-21 DIAGNOSIS — X58XXXA Exposure to other specified factors, initial encounter: Secondary | ICD-10-CM | POA: Insufficient documentation

## 2017-11-21 LAB — CBC WITH DIFFERENTIAL/PLATELET
BASOS ABS: 0 10*3/uL (ref 0.0–0.1)
BASOS PCT: 0 %
Eosinophils Absolute: 0.1 10*3/uL (ref 0.0–0.7)
Eosinophils Relative: 1 %
HEMATOCRIT: 27.7 % — AB (ref 36.0–46.0)
HEMOGLOBIN: 8.3 g/dL — AB (ref 12.0–15.0)
Lymphocytes Relative: 12 %
Lymphs Abs: 0.9 10*3/uL (ref 0.7–4.0)
MCH: 27.9 pg (ref 26.0–34.0)
MCHC: 30 g/dL (ref 30.0–36.0)
MCV: 93 fL (ref 78.0–100.0)
Monocytes Absolute: 0.6 10*3/uL (ref 0.1–1.0)
Monocytes Relative: 8 %
NEUTROS ABS: 5.7 10*3/uL (ref 1.7–7.7)
NEUTROS PCT: 79 %
Platelets: 164 10*3/uL (ref 150–400)
RBC: 2.98 MIL/uL — AB (ref 3.87–5.11)
RDW: 18.3 % — ABNORMAL HIGH (ref 11.5–15.5)
WBC: 7.2 10*3/uL (ref 4.0–10.5)

## 2017-11-21 LAB — PROTIME-INR
INR: 2.68
PROTHROMBIN TIME: 28.3 s — AB (ref 11.4–15.2)

## 2017-11-21 LAB — POC OCCULT BLOOD, ED: Fecal Occult Bld: POSITIVE — AB

## 2017-11-21 NOTE — ED Notes (Addendum)
Attempt to call discharge report-no answer

## 2017-11-21 NOTE — ED Provider Notes (Addendum)
[facility] EMERGENCY DEPARTMENT Provider Note   CSN: [id number] Arrival date & time: 11/21/17  3419     History   Chief Complaint Chief Complaint  Patient presents with  . Abscess    HPI [name] is a 72 y.o. female.  HPI Noted mildly painful swollen area at left axilla when she got out of the shower this morning.  She denies fever.  Nothing makes symptoms better or worse.  She was sent here for further evaluation.  She denies abdominal pain denies shortness of breath.  She last went to dialysis today and stayed for entire session. Past Medical History:  Diagnosis Date  . Anemia   . Arthritis   . Asthma   . Atrial fibrillation ([place])   . Atrial flutter [facility])    TEE/DCCV December 2013 - on Xarelto [facility])  . Bone spur of toe    Right 5th toe  . Chronic diastolic CHF (congestive heart failure) (HCC)    a. EF 50-55% by echo in 2015  . CKD (chronic kidney disease) stage 4, GFR 15-29 ml/min (HCC)    a. s/p fistula placement but not on HD. Followed by Nephrology.   Marland Kitchen COPD (chronic obstructive pulmonary disease) ([place])   . DVT of upper extremity (deep vein thrombosis) ([place])    Right basilic vein, June 3790  . Essential hypertension, benign   . Fractures   . SLE (systemic lupus erythematosus) ([place])   . Type 2 diabetes mellitus ([place])   . UTI (lower urinary tract infection)     Patient Active Problem List   Diagnosis Date Noted  . ESRD (end stage renal disease) on dialysis ([place]) 11/16/2017  . COPD with acute exacerbation ([place]) 11/03/2017  . Community acquired pneumonia 11/02/2017  . Nausea and vomiting 11/02/2017  . Iron deficiency anemia 08/07/2017  . Heme positive stool 10/04/2015  . Mixed hyperlipidemia 06/29/2015  . Type 2 diabetes mellitus with stage 4 chronic kidney disease ([place]) 06/29/2015  . Chronic kidney disease, stage V ([place]) 04/12/2014  . 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 Encounter for  therapeutic drug monitoring 03/30/2014  . Acute respiratory failure with hypoxia ([place]) 03/15/2014  . Chronic diastolic heart failure ([place]) 03/15/2014  . Anemia of chronic renal failure, stage 4 (severe) ([place]) 02/23/2014  . Folate deficiency 02/17/2014  . Aortic regurgitation 09/19/2012  . Atrial flutter ([place]) 09/14/2012  . Class 2 severe obesity due to excess calories with serious comorbidity and body mass index (BMI) of 35.0 to 35.9 in adult ([place]) 09/14/2012  . Type 2 diabetes mellitus with diabetic neuropathy ([place]) 09/14/2012  . SLE (systemic lupus erythematosus) ([place]) 09/14/2012  . Essential hypertension, benign 09/14/2012  . COPD (chronic obstructive pulmonary disease) ([place]) 09/14/2012    Past Surgical History:  Procedure Laterality Date  . ABDOMINAL HYSTERECTOMY  1983  . ANKLE SURGERY  1993  . AV FISTULA PLACEMENT Left 03/27/2014   Procedure: ARTERIOVENOUS (AV) FISTULA CREATION;  Surgeon: Rosetta Posner, MD;  Location: [place];  Service: Vascular;  Laterality: Left;  . BACK SURGERY  1980  . CARDIOVASCULAR STRESS TEST  12/19/2009   no stress induced rhythm abnormalities, ekg negative for ischemia  . CARDIOVERSION  09/16/2012   Procedure: CARDIOVERSION;  Surgeon: Sanda Klein, MD;  Location: [facility] ENDOSCOPY;  Service: Cardiovascular;  Laterality: N/A;  . COLONOSCOPY  09/20/2012   Dr. [name]: multiple tubular adenomas   . COLONOSCOPY  2005   Dr. [name]. Polyps, path unknown   .  COLONOSCOPY N/A 07/24/2016   Procedure: COLONOSCOPY;  Surgeon: [name], MD;  [facility];  Service: Endoscopy;  Laterality: N/A;  9:00 am  . ESOPHAGOGASTRODUODENOSCOPY  09/20/2012   Dr. [name]: duodenal erosion and possible resolving ulcer at the angularis   . IR FLUORO GUIDE CV LINE RIGHT  11/09/2017  . IR US GUIDE VASC ACCESS RIGHT  11/09/2017  . TEE WITHOUT CARDIOVERSION  09/16/2012   Procedure: TRANSESOPHAGEAL ECHOCARDIOGRAM (TEE);  Surgeon: Sanda Klein, MD;  Location: [facility] ENDOSCOPY;   Service: Cardiovascular;  Laterality: N/A;  . TRANSESOPHAGEAL ECHOCARDIOGRAM WITH CARDIOVERSION  09/16/2012   EF 60-65%, moderate LVH, moderate regurg of the aortic valve, LA moderately dilated    OB History    No data available       Home Medications    Prior to Admission medications   Medication Sig Start Date End Date Taking? Authorizing Provider  acetaminophen (TYLENOL) 500 MG tablet Take 1,000 mg by mouth every 6 (six) hours as needed. For pain    [provider]  albuterol (ACCUNEB) 0.63 MG/3ML nebulizer solution Take 1 ampule by nebulization every 6 (six) hours as needed for wheezing or shortness of breath.     [provider]  albuterol (PROVENTIL HFA;VENTOLIN HFA) 108 (90 BASE) MCG/ACT inhaler Inhale 2 puffs into the lungs every 6 (six) hours as needed. For shortness of breath    [provider]  amLODipine (NORVASC) 10 MG tablet Take 1 tablet (10 mg total) by mouth at bedtime. 03/28/14   [name], MD  atorvastatin (LIPITOR) 40 MG tablet Take 40 mg at bedtime by mouth.     [provider]  brimonidine (ALPHAGAN) 0.2 % ophthalmic solution Place 1 drop into the left eye daily. 11/14/17   Manuella Ghazi, Pratik D, DO  brimonidine-timolol (COMBIGAN) 0.2-0.5 % ophthalmic solution Place 1 drop into the left eye daily.     [provider]  calcitRIOL (ROCALTROL) 0.25 MCG capsule Take 0.25 mcg by mouth as directed. Take on Mondays Wednesdays and Fridays    [provider]  calcium acetate (PHOSLO) 667 MG capsule Take 2 capsules (1,334 mg total) by mouth 3 (three) times daily with meals. 11/13/17   Manuella Ghazi, Pratik D, DO  diclofenac (VOLTAREN) 0.1 % ophthalmic solution Place 1 drop into the left eye daily. Uses at lunch time    [provider]  dicyclomine (BENTYL) 10 MG capsule Take 10 mg by mouth 4 (four) times daily -  before meals and at bedtime.    [provider]  febuxostat (ULORIC) 40 MG tablet Take 40 mg by mouth  daily.    [provider]  fluconazole (DIFLUCAN) 100 MG tablet Take 100 mg by mouth every evening.    [provider]  folic acid (FOLVITE) 1 MG tablet Take 1 mg by mouth daily.    [provider]  furosemide (LASIX) 80 MG tablet Take 160 mg by mouth 2 (two) times daily.     [provider]  HYDROcodone-acetaminophen (NORCO/VICODIN) 5-325 MG tablet 1 or 2 tabs PO q12 hours prn pain 11/18/17   Oscar La, Arlo C, PA-C  hydroxychloroquine (PLAQUENIL) 200 MG tablet Take 300 mg by mouth as directed. Take 1 1/2 tablets daily    [provider]  Insulin Detemir (LEVEMIR FLEXTOUCH) 100 UNIT/ML Pen Inject 10 Units into the skin daily at 10 pm. 10/27/17   [name], [name], MD  insulin lispro (HUMALOG KWIKPEN) 100 UNIT/ML KiwkPen Give per sliding scale from 2-10  units subcutaneous three times a day.    [provider]  metoprolol tartrate (LOPRESSOR) 50 MG tablet TAKE ONE TABLET BY MOUTH TWICE DAILY. 10/23/17   Strader, Fransisco Hertz, PA-C  Multiple Vitamins-Minerals (CENTRUM SILVER PO) Take 1 tablet by mouth daily.    [provider]  nystatin (MYCOSTATIN) 100000 UNIT/ML suspension Nystatin 5 ml PO swish and swallow QID x 7 days    [provider]  ONE TOUCH ULTRA TEST test strip TEST 4 TIMES A DAY AS DIRECTED. 10/28/17   [name], [name], MD  ONETOUCH DELICA LANCETS 62E MISC USE TO TEST 4 TIMES DAILY. 09/05/15   Cassandria Anger, MD  predniSONE (DELTASONE) 5 MG tablet Take 5 mg by mouth daily with breakfast.    [provider]  timolol (TIMOPTIC) 0.5 % ophthalmic solution Place 1 drop into the left eye daily. 11/14/17   Manuella Ghazi, Pratik D, DO  travoprost, benzalkonium, (TRAVATAN) 0.004 % ophthalmic solution Place 1 drop into the left eye at bedtime.    [provider]  UNIFINE PENTIPS 31G X 6 MM MISC USE AS DIRECTED AT BEDTIME WITH LEVEMIR AND WITH SLIDING SCALE INSULIN. 10/16/17   Cassandria Anger, MD  Vitamin D,  Ergocalciferol, (DRISDOL) 50000 units CAPS capsule TAKE 1 CAPSULE BY MOUTH ONCE WEEKLY. 09/08/17   Cassandria Anger, MD    Family History Family History  Problem Relation Age of Onset  . Diabetes Brother   . Diabetes Father   . Hypertension Father   . Hypertension Sister   . Stroke Mother   . Diabetes Sister   . Hypertension Brother   . Colon cancer Neg Hx     Social History Social History   Tobacco Use  . Smoking status: Former Smoker    Packs/day: 1.00    Years: 30.00    Pack years: 30.00    Types: Cigarettes    Last attempt to quit: 08/29/2012    Years since quitting: 5.2  . Smokeless tobacco: Never Used  . Tobacco comment: quit 08-2012  Substance Use Topics  . Alcohol use: No    Alcohol/week: 0.0 oz  . Drug use: No     Allergies   Ace inhibitors   Review of Systems Review of Systems  Constitutional: Negative.   HENT: Negative.   Respiratory: Negative.   Cardiovascular: Negative.   Gastrointestinal: Negative.   Musculoskeletal: Positive for gait problem.       WheelChair bound  Skin: Positive for wound.       Wound on rightt calf and at left axilla  Allergic/Immunologic: Positive for immunocompromised state.       Hemodialysis patient  Psychiatric/Behavioral: Negative.   All other systems reviewed and are negative.    Physical Exam Updated Vital Signs BP (!) 147/50 (BP Location: Right Arm)   Pulse 77   Temp 98 F (36.7 C) (Oral)   Resp 18   Ht 5\' 4"  (1.626 m)   Wt 81.2 kg (179 lb)   SpO2 100%   BMI 30.73 kg/m   Physical Exam  Constitutional:  Chronically ill-appearing  HENT:  Head: Normocephalic and atraumatic.  Eyes: Conjunctivae are normal. Pupils are equal, round, and reactive to light.  Neck: Neck supple. No tracheal deviation present. No thyromegaly present.  Cardiovascular: Normal rate and regular rhythm.  No murmur heard. Dialysis catheter at right anterior chest site is not red warm or tender.  Pulmonary/Chest: Effort  normal and breath sounds normal.  Abdominal: Soft. Bowel sounds are normal.  She exhibits no distension. There is no tenderness.  Obese  Genitourinary: Rectal exam shows guaiac positive stool.  Genitourinary Comments: Normal tone brown stool Hemoccult positive  Musculoskeletal: Normal range of motion. She exhibits no edema or tenderness.  Neurological: She is alert. Coordination normal.  Skin: Skin is warm and dry. No rash noted.  Right lower extremity there is a 0.5 cm clean appearing ulcer at the posterior calf, middle third. Right upper extremity there is a tennis  ball sized firm purplish firm area at axilla with surrounding purplish ecchymosis.  Area is not tender or warm.  Appears to be hematoma.  Psychiatric: She has a normal mood and affect.  Nursing note and vitals reviewed.    ED Treatments / Results  Labs (all labs ordered are listed, but only abnormal results are displayed) Labs Reviewed - No data to display  EKG  EKG Interpretation None       Radiology No results found.  Procedures Procedures (including critical care time)  Medications Ordered in ED Medications - No data to display   Initial Impression / Assessment and Plan / ED Course  I have reviewed the triage vital signs and the nursing notes.  Pertinent labs & imaging results that were available during my care of the patient were reviewed by me and considered in my medical decision making (see chart for details).     I do not see any signs of infection.Marland Kitchen  Axilla is more consistent with hematoma than with abscess.  Suggest observation.  Ice packs.  Not I&D in light of the fact the patient is on anticoagulants Follow-up with PMD to discuss stopping warfarin and possible GI consultation as outpatient.  Work consistent with therapeutic Coumadin warfarin therapy and anemia.  Patient hemodynamically stable Final Clinical Impressions(s) / ED Diagnoses  Diagnoses #1 hematoma at left axilla #2 Hemoccult  positive stools #3 anemia Final diagnoses:  None    ED Discharge Orders    None       Orlie Dakin, MD 11/21/17 [place] Scrape    Orlie Dakin, MD 11/21/17 2002

## 2017-11-21 NOTE — ED Notes (Signed)
Attempted to call discharge report to [id number], [phone] and [phone]-no answer

## 2017-11-21 NOTE — ED Notes (Signed)
Attempted to call discharge report to [phone], no answer at this time

## 2017-11-21 NOTE — Discharge Instructions (Signed)
The swollen area on [name]'s left axilla is more consistent with hematoma than with infection or abscess.  Hold an ice pack on that area 4 times daily for 30 minutes at a time.  She also has trace Hemoccult positive stools.  Call her doctor tomorrow or Monday, 11/23/2017 to discuss whether it would be appropriate to stop warfarin and to arrange to  get her re-examined next week.  She may also need consultation with a gastroenterologist.  Call her primary care doctor to discuss.  She can return to the emergency department if concern for any reason

## 2017-11-21 NOTE — ED Notes (Signed)
Attempt to call discharge report-no answer

## 2017-11-21 NOTE — ED Triage Notes (Signed)
Pt from [facility] with bruising and hardened area to L axilla and arm. States she noticed it today while showering, no d/c. Had a full dialysis appointment today.

## 2017-11-22 ENCOUNTER — Encounter (HOSPITAL_COMMUNITY)
Admission: AD | Admit: 2017-11-22 | Discharge: 2017-11-22 | Disposition: A | Discharge status: Home / Self Care | Payer: Medicare Other | Source: Skilled Nursing Facility

## 2017-11-22 DIAGNOSIS — N184 Chronic kidney disease, stage 4 (severe): Secondary | ICD-10-CM | POA: Diagnosis not present

## 2017-11-22 DIAGNOSIS — D631 Anemia in chronic kidney disease: Secondary | ICD-10-CM | POA: Diagnosis not present

## 2017-11-22 LAB — PROTIME-INR
INR: 2.25
Prothrombin Time: 24.7 seconds — ABNORMAL HIGH (ref 11.4–15.2)

## 2017-11-23 ENCOUNTER — Non-Acute Institutional Stay (SKILLED_NURSING_FACILITY): Payer: Medicare Other | Admitting: Internal Medicine

## 2017-11-23 ENCOUNTER — Encounter (HOSPITAL_COMMUNITY)
Admission: RE | Admit: 2017-11-23 | Discharge: 2017-11-23 | Disposition: A | Discharge status: Home / Self Care | Payer: Medicare Other | Source: Skilled Nursing Facility | Attending: *Deleted | Admitting: *Deleted

## 2017-11-23 ENCOUNTER — Encounter: Payer: Self-pay | Admitting: Internal Medicine

## 2017-11-23 DIAGNOSIS — D631 Anemia in chronic kidney disease: Secondary | ICD-10-CM | POA: Diagnosis not present

## 2017-11-23 DIAGNOSIS — E1122 Type 2 diabetes mellitus with diabetic chronic kidney disease: Secondary | ICD-10-CM

## 2017-11-23 DIAGNOSIS — T148XXA Other injury of unspecified body region, initial encounter: Secondary | ICD-10-CM

## 2017-11-23 DIAGNOSIS — R195 Other fecal abnormalities: Secondary | ICD-10-CM

## 2017-11-23 DIAGNOSIS — R109 Unspecified abdominal pain: Secondary | ICD-10-CM | POA: Diagnosis not present

## 2017-11-23 DIAGNOSIS — Z794 Long term (current) use of insulin: Secondary | ICD-10-CM | POA: Diagnosis not present

## 2017-11-23 DIAGNOSIS — N184 Chronic kidney disease, stage 4 (severe): Secondary | ICD-10-CM

## 2017-11-23 DIAGNOSIS — I4891 Unspecified atrial fibrillation: Secondary | ICD-10-CM | POA: Diagnosis not present

## 2017-11-23 LAB — PROTIME-INR
INR: 2.39
PROTHROMBIN TIME: 25.8 s — AB (ref 11.4–15.2)

## 2017-11-23 NOTE — Progress Notes (Signed)
Location:   [facility] Room Number: 161/W Place of Service:  SNF [phone]) Provider:  Lilia Pro, MD  Patient Care Team: Sharilyn Sites, MD as PCP - General (Family Medicine) Satira Sark, MD as Consulting Physician (Cardiology) Donato Heinz, MD as Consulting Physician (Nephrology) Corliss Parish, MD as Consulting Physician (Nephrology) Early, Arvilla Meres, MD as Consulting Physician (Vascular Surgery) Farrel Gobble, MD (Inactive) as Consulting Physician (Hematology and Oncology) [name], MD as Consulting Physician (Gastroenterology)  Extended Emergency Contact Information Primary Emergency Contact: [facility] Address: [address] [place]          [place], [place] [address] [place] Phone: [phone] Mobile Phone: [phone] Relation: Spouse  Code Status:  Full Code Goals of care: Advanced Directive information Advanced Directives 11/23/2017  Does Patient Have a Medical Advance Directive? Yes  Type of Advance Directive (No Data)  Does patient want to make changes to medical advance directive? No - Patient declined  Copy of [place] in Chart? No - copy requested  Would patient like information on creating a medical advance directive? No - Patient declined  Pre-existing out of facility DNR order (yellow form or pink MOST form) -     Chief Complaint  Patient presents with  . Acute Visit    F/U ED VIist   For left arm hematoma Is  HPI:  Pt is a 72 y.o. female seen today for an acute visit for follow-up of a left arm hematoma.  Patient has a complex medical history including end-stage renal disease now on dialysis.  She was recently hospitalized for pneumonia and COPD.  She is also has a history of diabetes hyperlipidemia chronic diastolic CHF atrial fibrillation on chronic Coumadin and systemic lupus and hypertension.  She was treated with IV antibiotic for pneumonia in  the hospital.  She also was volume overload and got IV Lasix and then started on dialysis.  Apparently over the weekend she developed what appeared to be a fairly large hematoma of her left upper arm axilla area.  She was sent to the ER for evaluation-----where this was diagnosed as most likely a hematoma-again she is on Coumadin actually earlier INR had been supratherapeutic and Coumadin had been held and just recently restarted-INR in ER was 2.68.  She was sent back to facility with recommendation to consider whether to continue Coumadin.  Of note testing in the ER also showed a guaiac positive stool.  Currently she is sitting in her chair comfortably she is applying ice to the hematoma per recommendation of the ER she thinks the hematoma is improving on her left arm.  She continues to complain of some chronic right-sided abdominal discomfort which is not new and not better or worse recently.  An ultrasound done in the hospital during her hospitalization as well as a CT scan did not really show any acute process it did show sludge or gallstones in the gallbladder but no acute bowel pathology.  In regards to guaiac positive stool it appears-- per chart review she did have a colonoscopy in October 2017--that showed 3 polyps--the larger ones could have easily caused aoccult bleeding per GI report-  Currently she is sitting in her wheelchair comfortably has no acute complaints other than the chronic right-sided abdominal discomfort-vital signs are stable     Past Medical History:  Diagnosis Date  . Anemia   . Arthritis   . Asthma   . Atrial fibrillation ([place])   .  Atrial flutter [facility])    TEE/DCCV December 2013 - on Xarelto [facility])  . Bone spur of toe    Right 5th toe  . Chronic diastolic CHF (congestive heart failure) (HCC)    a. EF 50-55% by echo in 2015  . CKD (chronic kidney disease) stage 4, GFR 15-29 ml/min (HCC)    a. s/p fistula placement but not on HD. Followed by  Nephrology.   Marland Kitchen COPD (chronic obstructive pulmonary disease) ([place])   . DVT of upper extremity (deep vein thrombosis) ([place] [facility])    Right basilic vein, June 9935  . Essential hypertension, benign   . Fractures   . SLE (systemic lupus erythematosus) ([place])   . Type 2 diabetes mellitus ([place])   . UTI (lower urinary tract infection)    Past Surgical History:  Procedure Laterality Date  . ABDOMINAL HYSTERECTOMY  1983  . ANKLE SURGERY  1993  . AV FISTULA PLACEMENT Left 03/27/2014   Procedure: ARTERIOVENOUS (AV) FISTULA CREATION;  Surgeon: Rosetta Posner, MD;  Location: [facility];  Service: Vascular;  Laterality: Left;  . BACK SURGERY  1980  . CARDIOVASCULAR STRESS TEST  12/19/2009   no stress induced rhythm abnormalities, ekg negative for ischemia  . CARDIOVERSION  09/16/2012   Procedure: CARDIOVERSION;  Surgeon: Sanda Klein, MD;  Location: [facility] ENDOSCOPY;  Service: Cardiovascular;  Laterality: N/A;  . COLONOSCOPY  09/20/2012   Dr. [name]: multiple tubular adenomas   . COLONOSCOPY  2005   Dr. [name]. Polyps, path unknown   . COLONOSCOPY N/A 07/24/2016   Procedure: COLONOSCOPY;  Surgeon: [name], MD;  Location: [facility]O [facility];  Service: Endoscopy;  Laterality: N/A;  9:00 am  . ESOPHAGOGASTRODUODENOSCOPY  09/20/2012   Dr. [name]: duodenal erosion and possible resolving ulcer at the angularis   . IR FLUORO GUIDE CV LINE RIGHT  11/09/2017  . IR US GUIDE VASC ACCESS RIGHT  11/09/2017  . TEE WITHOUT CARDIOVERSION  09/16/2012   Procedure: TRANSESOPHAGEAL ECHOCARDIOGRAM (TEE);  Surgeon: Sanda Klein, MD;  Location: [facility] ENDOSCOPY;  Service: Cardiovascular;  Laterality: N/A;  . TRANSESOPHAGEAL ECHOCARDIOGRAM WITH CARDIOVERSION  09/16/2012   EF 60-65%, moderate LVH, moderate regurg of the aortic valve, LA moderately dilated    Allergies  Allergen Reactions  . Ace Inhibitors Cough    Outpatient Encounter Medications as of 11/23/2017  Medication Sig  . acetaminophen (TYLENOL) 500 MG tablet Take 1,000  mg by mouth every 6 (six) hours as needed. For pain  . albuterol (ACCUNEB) 0.63 MG/3ML nebulizer solution Take 1 ampule by nebulization every 6 (six) hours as needed for wheezing or shortness of breath.   Marland Kitchen albuterol (PROVENTIL HFA;VENTOLIN HFA) 108 (90 BASE) MCG/ACT inhaler Inhale 2 puffs into the lungs every 6 (six) hours as needed. For shortness of breath  . amLODipine (NORVASC) 10 MG tablet Take 1 tablet (10 mg total) by mouth at bedtime.  Marland Kitchen atorvastatin (LIPITOR) 40 MG tablet Take 40 mg at bedtime by mouth.   . brimonidine-timolol (COMBIGAN) 0.2-0.5 % ophthalmic solution Place 1 drop into the left eye daily.   . calcium acetate (PHOSLO) 667 MG capsule Take 2 capsules (1,334 mg total) by mouth 3 (three) times daily with meals.  . diclofenac (VOLTAREN) 0.1 % ophthalmic solution Place 1 drop into the left eye daily. Uses at lunch time  . dicyclomine (BENTYL) 10 MG capsule Take 10 mg by mouth 4 (four) times daily -  before meals and at bedtime.  . febuxostat (ULORIC) 40 MG tablet Take 40 mg by  mouth daily.  . folic acid (FOLVITE) 1 MG tablet Take 1 mg by mouth daily.  Marland Kitchen HYDROcodone-acetaminophen (NORCO/VICODIN) 5-325 MG tablet Take 1-2 tablets by mouth every 8 (eight) hours as needed for moderate pain.  . hydroxychloroquine (PLAQUENIL) 200 MG tablet Take 300 mg by mouth as directed. Take 1 1/2 tablets daily  . Insulin Detemir (LEVEMIR FLEXTOUCH) 100 UNIT/ML Pen Inject 10 Units into the skin daily at 10 pm.  . insulin lispro (HUMALOG KWIKPEN) 100 UNIT/ML KiwkPen Give per sliding scale from 2-10 units subcutaneous three times a day.  . metoprolol tartrate (LOPRESSOR) 50 MG tablet Take 50 mg by mouth at bedtime.  Marland Kitchen METOPROLOL TARTRATE PO Take 1 tablet by mouth except dialysis days which are Tuesday,Thursday, and Saturday mornings  . Multiple Vitamins-Minerals (CENTRUM SILVER PO) Take 1 tablet by mouth daily.  . ONE TOUCH ULTRA TEST test strip TEST 4 TIMES A DAY AS DIRECTED.  Marland Kitchen ONETOUCH DELICA  LANCETS 97Q MISC USE TO TEST 4 TIMES DAILY.  Marland Kitchen predniSONE (DELTASONE) 5 MG tablet Take 5 mg by mouth daily with breakfast.  . travoprost, benzalkonium, (TRAVATAN) 0.004 % ophthalmic solution Place 1 drop into the left eye at bedtime.  Marland Kitchen UNIFINE PENTIPS 31G X 6 MM MISC USE AS DIRECTED AT BEDTIME WITH LEVEMIR AND WITH SLIDING SCALE INSULIN.  Marland Kitchen Vitamin D, Ergocalciferol, (DRISDOL) 50000 units CAPS capsule TAKE 1 CAPSULE BY MOUTH ONCE WEEKLY.  . [DISCONTINUED] brimonidine (ALPHAGAN) 0.2 % ophthalmic solution Place 1 drop into the left eye daily.  . [DISCONTINUED] calcitRIOL (ROCALTROL) 0.25 MCG capsule Take 0.25 mcg by mouth as directed. Take on Mondays Wednesdays and Fridays  . [DISCONTINUED] fluconazole (DIFLUCAN) 100 MG tablet Take 100 mg by mouth every evening.  . [DISCONTINUED] furosemide (LASIX) 80 MG tablet Take 160 mg by mouth 2 (two) times daily.   . [DISCONTINUED] HYDROcodone-acetaminophen (NORCO/VICODIN) 5-325 MG tablet 1 or 2 tabs PO q12 hours prn pain (Patient taking differently: 1 or 2 tabs PO q 8 hours prn pain)  . [DISCONTINUED] metoprolol tartrate (LOPRESSOR) 50 MG tablet TAKE ONE TABLET BY MOUTH TWICE DAILY.  . [DISCONTINUED] nystatin (MYCOSTATIN) 100000 UNIT/ML suspension Nystatin 5 ml PO swish and swallow QID x 7 days  . [DISCONTINUED] timolol (TIMOPTIC) 0.5 % ophthalmic solution Place 1 drop into the left eye daily.   No facility-administered encounter medications on file as of 11/23/2017.     Review of Systems   General she not complaining any fever chills.  Skin does not complain of rashes or itching she does have a hematoma left axilla area she feels this is slowly improving.  Head ears eyes nose mouth and throat is not complain of visual changes or sore throat.  Respiratory does not complain of shortness of breath or cough at this time.  Cardiac does not complain of chest pain.  GI has somewhat chronic right sided abdominal discomfort-otherwise does not complain of  nausea vomiting diarrhea constipation.  Musculoskeletal does not complain of acute pain- however does have somewhat generalized discomfort and according to nursing would like frequency of her Norco increased--- currently every 8 hours   As needed.  GU does not complain of dysuria she does have end-stage renal disease on dialysis- she had an episode of what was thought to be vaginal bleeding last week but there has been no reoccurrence  Neurologic does not complain of dizziness headache or syncope.  And psych continues to be in good spirits visiting with her husband does not complain of being anxious  or depressed    Immunization History  Administered Date(s) Administered  . Influenza,inj,Quad PF,6+ Mos 07/03/2014  . Influenza-Unspecified 07/11/2015, 07/08/2016  . PPD Test 11/07/2017  . Pneumococcal Polysaccharide-23 11/04/2017   Pertinent  Health Maintenance Due  Topic Date Due  . FOOT EXAM  12/14/2017 (Originally 11/27/1955)  . OPHTHALMOLOGY EXAM  12/14/2017 (Originally 03/28/2016)  . URINE MICROALBUMIN  12/14/2017 (Originally 11/27/1955)  . DEXA SCAN  12/14/2017 (Originally 11/26/2010)  . INFLUENZA VACCINE  07/08/2018 (Originally 04/29/2017)  . HEMOGLOBIN A1C  04/19/2018  . PNA vac Low Risk Adult (2 of 2 - PCV13) 11/04/2018  . MAMMOGRAM  09/29/2019  . COLONOSCOPY  07/24/2026   Fall Risk  10/27/2017 07/24/2017 01/16/2017 07/17/2016 04/09/2016  Falls in the past year? _0    Functional Status Survey:    Temperature is 98.7 pulse is 88 respirations 20 blood pressure 133/48  Physical Exam  In general this is a pleasant elderly female in no distress  Her skin is warm and dry she does have covering over her right calf wound.  On the underside of her upper left arm extending to the axilla is a hematoma this is soft with minimal elevation there is some slightfirmness of the axilla area but according the patient this has improved-  -Eyes --sclera and conjunctive are  clear  Oropharynx is clear mucous membranes moist  Chest is clear to auscultation with shallow air entry there is no labored breathing or overt congestion.  Heart is irregular irregular rate and rhythm without murmur gallop or rub she continues to have mild baseline lower extremity edema.  Abdomen is soft-has mild tenderness palpation on the right side which is not new-bowel sounds are active.  Musculoskeletal is able to move all extremities x4 but has lower extremity weakness.  Neurologic appears to be grossly intact her speech is clear she is alert cranial nerves intact.  Psych she is alert and oriented pleasant and appropriate.  -     Labs reviewed: Recent Labs    11/06/17 0547  11/09/17 0459 11/10/17 0507 11/11/17 0415  11/15/17 0500 11/17/17 0706 11/19/17 0713  NA 134*   < > 138 141 136   < > 137 135 136  K 4.1   < > 4.0 3.9 3.8   < > 3.5 3.5 3.8  CL 99*   < > 100* 100* 96*   < > 97* 97* 98*  CO2 20*   < > _1 < > _2 GLUCOSE 155*   < > 442* 185* 334*   < > 193* 183* 89  BUN 128*   < > 152* 97* 69*   < > 94* 41* 29*  CREATININE 4.56*   < > 4.08* 2.87* 2.85*   < > 4.52* 3.21* 3.93*  CALCIUM 8.6*   < > 8.7* 8.5* 8.3*   < > 8.4* 7.8* 8.4*  MG 2.4  --   --   --   --   --   --   --   --   PHOS 7.0*   < > 5.5* 4.4 4.2  --   --   --   --    < > = values in this interval not displayed.   Recent Labs    10/20/17 1029 10/21/17 1027 11/02/17 1536  11/09/17 0459 11/10/17 0507 11/11/17 0415  AST 24 28 37  --   --   --   --   ALT 24 25  27  --   --   --   --   ALKPHOS 69 56 108  --   --   --   --   BILITOT 0.2 0.3 0.6  --   --   --   --   PROT 6.6 7.1 7.4  --   --   --   --   ALBUMIN 3.3* 2.8* 2.3*   < > 2.0* 2.3* 2.1*   < > = values in this interval not displayed.   Recent Labs    11/15/17 0500 11/17/17 0706 11/19/17 0713 11/21/17 0830  WBC 15.5* 11.5* 9.5 7.2  NEUTROABS 13.1* 9.7*  --  5.7  HGB 10.4* 9.5* 8.4* 8.3*  HCT 33.4* 30.8* 27.6* 27.7*   MCV 89.3 90.3 92.0 93.0  PLT 277 222 200 164   Lab Results  Component Value Date   TSH 2.360 09/19/2015   Lab Results  Component Value Date   HGBA1C 5.6 10/20/2017   No results found for: CHOL, HDL, LDLCALC, LDLDIRECT, TRIG, CHOLHDL  Significant Diagnostic Results in last 30 days:  Ct Abdomen Pelvis Wo Contrast  Result Date: 11/02/2017 CLINICAL DATA:  Emesis for the last 3 days.  Intermittent fever. EXAM: CT ABDOMEN AND PELVIS WITHOUT CONTRAST TECHNIQUE: Multidetector CT imaging of the abdomen and pelvis was performed following the standard protocol without IV contrast. COMPARISON:  09/09/2017.  08/31/2017. FINDINGS: Lower chest: There is a small left effusion. There is segmental consolidation in the left lower lobe suggesting pneumonia. The heart is mildly enlarged. Hepatobiliary: No liver parenchymal abnormality is seen. There is some layering sludge or stones in the gallbladder. No CT evidence of cholecystitis. Pancreas: Normal Spleen: Normal Adrenals/Urinary Tract: Adrenal glands are normal. Kidneys show vascular calcifications but no acute finding. Bladder appears normal. Stomach/Bowel: No acute bowel pathology is seen. No sign of obstruction or inflammatory disease. Vascular/Lymphatic: Advanced aortic atherosclerosis. Atherosclerosis of the aortic branch vessels. IVC is normal. No retroperitoneal adenopathy. Reproductive: Previous hysterectomy.  No pelvic mass. Other: No free fluid or air. Musculoskeletal: Chronic lumbar degenerative changes. No acute bone finding. IMPRESSION: Segmental consolidation in the left lower lobe consistent with bronchopneumonia. Small amount of left pleural fluid. Sludge or gallstones in the gallbladder. Aortic atherosclerosis. No acute bowel pathology seen. No obstruction or inflammatory finding. Electronically Signed   By: Nelson Chimes M.D.   On: 11/02/2017 18:28   Dg Tibia/fibula Right  Result Date: 11/02/2017 CLINICAL DATA:  Right lower leg ulcers. EXAM:  RIGHT TIBIA AND FIBULA - 2 VIEW COMPARISON:  None. FINDINGS: There is no evidence of fracture or other focal bone lesions. Surgical screw is seen in distal right fibula. Vascular calcifications are noted. No lytic destruction is seen to suggest osteomyelitis. IMPRESSION: No fracture or dislocation is noted. No definite evidence of osteomyelitis. Electronically Signed   By: Marijo Conception, M.D.   On: 11/02/2017 17:30   US Abdomen Complete  Result Date: 11/03/2017 CLINICAL DATA:  Emesis for 3 days and intermittent fever. Chronic abdominal pain. EXAM: ABDOMEN ULTRASOUND COMPLETE COMPARISON:  Abdominal CT 11/02/2017 FINDINGS: Gallbladder: Small amount of echogenic sludge within the gallbladder. No gallbladder wall distention or wall thickening. Reportedly, the patient does not have a sonographic Murphy sign. No gallstones. Common bile duct: Diameter: 0.4 cm. Liver: No focal lesion identified. Within normal limits in parenchymal echogenicity. Portal vein is patent on color Doppler imaging with normal direction of blood flow towards the liver. IVC: No abnormality visualized. Pancreas: Visualized portion unremarkable. Spleen: Size and  appearance within normal limits. Right Kidney: Length: 11.6 cm. Increased echogenicity in the right kidney without hydronephrosis. There is a indeterminate hypoechoic structure along the lateral upper pole region. This structure measures 2.6 x 2.4 x 2.8 cm. Structure may be partially cystic but cannot exclude a solid component. Review of the prior non contrast CT demonstrates minimal fullness in this area but there is not an obvious lesion based on the non contrast images. Left Kidney: Length: 10.1 cm. Echogenicity within normal limits. No mass or hydronephrosis visualized. Abdominal aorta: No aneurysm visualized. Other findings: None. IMPRESSION: Indeterminate right renal lesion measuring up to 2.8 cm. This could represent a complex cystic lesion but cannot exclude a solid component.  This lesion is not well characterized on the prior non contrast CTs. Patient is not a candidate for contrast enhanced studies due to renal function but this lesion could be further characterized with a non contrast renal MRI. Gallbladder sludge without stones or biliary dilatation. Electronically Signed   By: Markus Daft M.D.   On: 11/03/2017 11:50   Ir Fluoro Guide Cv Line Right  Result Date: 11/09/2017 INDICATION: Acute on chronic renal failure. Please place tunneled dialysis catheter for the initiation of dialysis. EXAM: TUNNELED CENTRAL VENOUS HEMODIALYSIS CATHETER PLACEMENT WITH ULTRASOUND AND FLUOROSCOPIC GUIDANCE MEDICATIONS: Ancef 2 gm IV . The antibiotic was given in an appropriate time interval prior to skin puncture. ANESTHESIA/SEDATION: Versed 1.5 mg IV; Fentanyl 50 mcg IV; Moderate Sedation Time: 13 minutes The patient was continuously monitored during the procedure by the interventional radiology nurse under my direct supervision. FLUOROSCOPY TIME:  Fluoroscopy Time: 30 seconds (3 mGy). COMPLICATIONS: None immediate. PROCEDURE: Informed written consent was obtained from the patient after a discussion of the risks, benefits, and alternatives to treatment. Questions regarding the procedure were encouraged and answered. The right neck and chest were prepped with chlorhexidine in a sterile fashion, and a sterile drape was applied covering the operative field. Maximum barrier sterile technique with sterile gowns and gloves were used for the procedure. A timeout was performed prior to the initiation of the procedure. After creating a small venotomy incision, a micropuncture kit was utilized to access the internal jugular vein. Real-time ultrasound guidance was utilized for vascular access including the acquisition of a permanent ultrasound image documenting patency of the accessed vessel. The microwire was utilized to measure appropriate catheter length. A stiff Glidewire was advanced to the level of the  IVC and the micropuncture sheath was exchanged for a peel-away sheath. A palindrome tunneled hemodialysis catheter measuring 19 cm from tip to cuff was tunneled in a retrograde fashion from the anterior chest wall to the venotomy incision. The catheter was then placed through the peel-away sheath with tips ultimately positioned within the superior aspect of the right atrium. Final catheter positioning was confirmed and documented with a spot radiographic image. The catheter aspirates and flushes normally. The catheter was flushed with appropriate volume heparin dwells. The catheter exit site was secured with a 0-Prolene retention suture. The venotomy incision was closed with an interrupted 4-0 Vicryl, Dermabond and Steri-strips. Dressings were applied. The patient tolerated the procedure well without immediate post procedural complication. IMPRESSION: Successful placement of 19 cm tip to cuff tunneled hemodialysis catheter via the right internal jugular vein with tips terminating within the superior aspect of the right atrium. The catheter is ready for immediate use. Electronically Signed   By: Sandi Mariscal M.D.   On: 11/09/2017 14:01   Ir US Guide Vasc Access Right  Result Date: 11/09/2017 INDICATION: Acute on chronic renal failure. Please place tunneled dialysis catheter for the initiation of dialysis. EXAM: TUNNELED CENTRAL VENOUS HEMODIALYSIS CATHETER PLACEMENT WITH ULTRASOUND AND FLUOROSCOPIC GUIDANCE MEDICATIONS: Ancef 2 gm IV . The antibiotic was given in an appropriate time interval prior to skin puncture. ANESTHESIA/SEDATION: Versed 1.5 mg IV; Fentanyl 50 mcg IV; Moderate Sedation Time: 13 minutes The patient was continuously monitored during the procedure by the interventional radiology nurse under my direct supervision. FLUOROSCOPY TIME:  Fluoroscopy Time: 30 seconds (3 mGy). COMPLICATIONS: None immediate. PROCEDURE: Informed written consent was obtained from the patient after a discussion of the  risks, benefits, and alternatives to treatment. Questions regarding the procedure were encouraged and answered. The right neck and chest were prepped with chlorhexidine in a sterile fashion, and a sterile drape was applied covering the operative field. Maximum barrier sterile technique with sterile gowns and gloves were used for the procedure. A timeout was performed prior to the initiation of the procedure. After creating a small venotomy incision, a micropuncture kit was utilized to access the internal jugular vein. Real-time ultrasound guidance was utilized for vascular access including the acquisition of a permanent ultrasound image documenting patency of the accessed vessel. The microwire was utilized to measure appropriate catheter length. A stiff Glidewire was advanced to the level of the IVC and the micropuncture sheath was exchanged for a peel-away sheath. A palindrome tunneled hemodialysis catheter measuring 19 cm from tip to cuff was tunneled in a retrograde fashion from the anterior chest wall to the venotomy incision. The catheter was then placed through the peel-away sheath with tips ultimately positioned within the superior aspect of the right atrium. Final catheter positioning was confirmed and documented with a spot radiographic image. The catheter aspirates and flushes normally. The catheter was flushed with appropriate volume heparin dwells. The catheter exit site was secured with a 0-Prolene retention suture. The venotomy incision was closed with an interrupted 4-0 Vicryl, Dermabond and Steri-strips. Dressings were applied. The patient tolerated the procedure well without immediate post procedural complication. IMPRESSION: Successful placement of 19 cm tip to cuff tunneled hemodialysis catheter via the right internal jugular vein with tips terminating within the superior aspect of the right atrium. The catheter is ready for immediate use. Electronically Signed   By: Sandi Mariscal M.D.   On:  11/09/2017 14:01   Dg Chest Port 1 View  Result Date: 11/03/2017 CLINICAL DATA:  Respiratory failure EXAM: PORTABLE CHEST 1 VIEW COMPARISON:  November 12, 2017 FINDINGS: There is stable cardiomegaly with mild pulmonary venous hypertension. There is atelectatic change in the left base. No frank edema or consolidation evident. There is aortic atherosclerosis. No bone lesions appreciable. IMPRESSION: Evidence of a degree of pulmonary vascular congestion without frank edema or consolidation. There is left base atelectasis. There is aortic atherosclerosis. Aortic Atherosclerosis (ICD10-I70.0). Electronically Signed   By: Lowella Grip III M.D.   On: 11/03/2017 08:37   Dg Chest Port 1 View  Result Date: 11/02/2017 CLINICAL DATA:  Shortness of breath and cough. EXAM: PORTABLE CHEST 1 VIEW COMPARISON:  August 13, 2017 FINDINGS: Stable cardiomegaly. Mild edema. No other interval changes or acute abnormalities. IMPRESSION: Cardiomegaly and mild edema. Electronically Signed   By: Dorise Bullion III M.D   On: 11/02/2017 19:11    Assessment/Plan  #1-left axilla hematoma- this appears to be slowly resolving recommendation is to continue ice.  Currently her Coumadin is on hold update INR is pending for later this week.  2.  History of guaiac positive stool in ER-.Hemoglobin in the ER was 8.3--- this appears to be slowly trending down it was 8.44 days ago-9.5 approximately a week ago had been as high as 11.2 a couple weeks ago.--- She is followed by hematology as well and I see she is slated for an injection on March 6.  Secondary to the guaiac positive stool will order a GI consult as well.  3.-History of chronic right-sided abdominal discomfort-again studies and the hospital during her most recent hospitalization were unremarkable- again will order a GI consult.  4.-History of type 2 diabetes-is on Humalog sliding scale with meals as well as Levemir 10 units nightly- morning blood sugars are largely in  the mid 100s-somewhat more variability later in the day ranging mainly from the mid 100s to lower 200s-occasional readings under 100s but these are not frequent  #5 history of atrial fibrillation this appears rate controlled on Lopressor- at this point Coumadin is on hold awaiting updated INR also will await GI consult- at this point would try to keep the INR is close to 2 as possible per discussion with Dr. Lyndel Safe  #6 pain management-patient apparently has complained at times of some increased pain will increase her Norco 5 325 mg 1 or 2 tabs as needed to every 6 hours from current every 8 hours  Of note will obtain an updated CBC is BMP later this week when INR is    done  [id number]

## 2017-11-24 ENCOUNTER — Ambulatory Visit (HOSPITAL_COMMUNITY): Payer: Medicare Other | Admitting: Physical Therapy

## 2017-11-24 DIAGNOSIS — D689 Coagulation defect, unspecified: Secondary | ICD-10-CM | POA: Diagnosis not present

## 2017-11-24 DIAGNOSIS — E876 Hypokalemia: Secondary | ICD-10-CM | POA: Diagnosis not present

## 2017-11-24 DIAGNOSIS — N2581 Secondary hyperparathyroidism of renal origin: Secondary | ICD-10-CM | POA: Diagnosis not present

## 2017-11-24 DIAGNOSIS — E039 Hypothyroidism, unspecified: Secondary | ICD-10-CM | POA: Diagnosis not present

## 2017-11-24 DIAGNOSIS — N186 End stage renal disease: Secondary | ICD-10-CM | POA: Diagnosis not present

## 2017-11-24 DIAGNOSIS — E1129 Type 2 diabetes mellitus with other diabetic kidney complication: Secondary | ICD-10-CM | POA: Diagnosis not present

## 2017-11-24 DIAGNOSIS — D631 Anemia in chronic kidney disease: Secondary | ICD-10-CM | POA: Diagnosis not present

## 2017-11-25 ENCOUNTER — Other Ambulatory Visit: Payer: Self-pay

## 2017-11-25 MED ORDER — HYDROCODONE-ACETAMINOPHEN 5-325 MG PO TABS: 1.0000 | ORAL_TABLET | Freq: Three times a day (TID) | ORAL | 0 refills | Status: DC | PRN

## 2017-11-25 NOTE — Telephone Encounter (Signed)
RX Fax for [facility]@ [phone]  

## 2017-11-26 ENCOUNTER — Ambulatory Visit: Payer: Medicare Other | Admitting: Vascular Surgery

## 2017-11-26 ENCOUNTER — Encounter: Payer: Self-pay | Admitting: Internal Medicine

## 2017-11-26 ENCOUNTER — Non-Acute Institutional Stay (SKILLED_NURSING_FACILITY): Payer: Medicare Other | Admitting: Internal Medicine

## 2017-11-26 ENCOUNTER — Encounter (HOSPITAL_COMMUNITY)
Admission: RE | Admit: 2017-11-26 | Discharge: 2017-11-26 | Disposition: A | Discharge status: Home / Self Care | Payer: Medicare Other | Source: Skilled Nursing Facility | Attending: Internal Medicine | Admitting: Internal Medicine

## 2017-11-26 DIAGNOSIS — E1129 Type 2 diabetes mellitus with other diabetic kidney complication: Secondary | ICD-10-CM | POA: Insufficient documentation

## 2017-11-26 DIAGNOSIS — E785 Hyperlipidemia, unspecified: Secondary | ICD-10-CM | POA: Diagnosis not present

## 2017-11-26 DIAGNOSIS — E876 Hypokalemia: Secondary | ICD-10-CM | POA: Diagnosis not present

## 2017-11-26 DIAGNOSIS — E1140 Type 2 diabetes mellitus with diabetic neuropathy, unspecified: Secondary | ICD-10-CM

## 2017-11-26 DIAGNOSIS — N2581 Secondary hyperparathyroidism of renal origin: Secondary | ICD-10-CM | POA: Diagnosis not present

## 2017-11-26 DIAGNOSIS — N186 End stage renal disease: Secondary | ICD-10-CM | POA: Diagnosis not present

## 2017-11-26 DIAGNOSIS — D509 Iron deficiency anemia, unspecified: Secondary | ICD-10-CM | POA: Diagnosis not present

## 2017-11-26 DIAGNOSIS — Z794 Long term (current) use of insulin: Secondary | ICD-10-CM

## 2017-11-26 DIAGNOSIS — N184 Chronic kidney disease, stage 4 (severe): Secondary | ICD-10-CM | POA: Diagnosis not present

## 2017-11-26 DIAGNOSIS — I10 Essential (primary) hypertension: Secondary | ICD-10-CM | POA: Insufficient documentation

## 2017-11-26 DIAGNOSIS — T148XXA Other injury of unspecified body region, initial encounter: Secondary | ICD-10-CM

## 2017-11-26 DIAGNOSIS — D689 Coagulation defect, unspecified: Secondary | ICD-10-CM | POA: Diagnosis not present

## 2017-11-26 DIAGNOSIS — D631 Anemia in chronic kidney disease: Secondary | ICD-10-CM

## 2017-11-26 DIAGNOSIS — I4892 Unspecified atrial flutter: Secondary | ICD-10-CM | POA: Insufficient documentation

## 2017-11-26 DIAGNOSIS — I4891 Unspecified atrial fibrillation: Secondary | ICD-10-CM | POA: Diagnosis not present

## 2017-11-26 DIAGNOSIS — E039 Hypothyroidism, unspecified: Secondary | ICD-10-CM | POA: Diagnosis not present

## 2017-11-26 LAB — CBC
HEMATOCRIT: 27.5 % — AB (ref 36.0–46.0)
Hemoglobin: 8 g/dL — ABNORMAL LOW (ref 12.0–15.0)
MCH: 28 pg (ref 26.0–34.0)
MCHC: 29.1 g/dL — ABNORMAL LOW (ref 30.0–36.0)
MCV: 96.2 fL (ref 78.0–100.0)
PLATELETS: 200 10*3/uL (ref 150–400)
RBC: 2.86 MIL/uL — ABNORMAL LOW (ref 3.87–5.11)
RDW: 18.6 % — ABNORMAL HIGH (ref 11.5–15.5)
WBC: 6.2 10*3/uL (ref 4.0–10.5)

## 2017-11-26 LAB — BASIC METABOLIC PANEL
ANION GAP: 11 (ref 5–15)
BUN: 28 mg/dL — ABNORMAL HIGH (ref 6–20)
CHLORIDE: 100 mmol/L — AB (ref 101–111)
CO2: 25 mmol/L (ref 22–32)
Calcium: 8.6 mg/dL — ABNORMAL LOW (ref 8.9–10.3)
Creatinine, Ser: 3.37 mg/dL — ABNORMAL HIGH (ref 0.44–1.00)
GFR calc Af Amer: 15 mL/min — ABNORMAL LOW (ref >60)
GFR, EST NON AFRICAN AMERICAN: 13 mL/min — AB (ref >60)
GLUCOSE: 59 mg/dL — AB (ref 65–99)
Potassium: 4.2 mmol/L (ref 3.5–5.1)
Sodium: 136 mmol/L (ref 135–145)

## 2017-11-26 LAB — PROTIME-INR
INR: 1.28
Prothrombin Time: 15.9 seconds — ABNORMAL HIGH (ref 11.4–15.2)

## 2017-11-26 NOTE — Progress Notes (Signed)
Location:   [facility] Room Number: 062/I Place of Service:  SNF [phone]) Provider:  Lilia Pro, MD  Patient Care Team: Sharilyn Sites, MD as PCP - General (Family Medicine) Satira Sark, MD as Consulting Physician (Cardiology) Donato Heinz, MD as Consulting Physician (Nephrology) Corliss Parish, MD as Consulting Physician (Nephrology) Early, Arvilla Meres, MD as Consulting Physician (Vascular Surgery) Farrel Gobble, MD (Inactive) as Consulting Physician (Hematology and Oncology) [name], MD as Consulting Physician (Gastroenterology)  Extended Emergency Contact Information Primary Emergency Contact: [facility] Address: [address] [place]          [place], [place] [address] [place] Phone: [phone] Mobile Phone: [phone] Relation: Spouse  Code Status:  Full Code Goals of care: Advanced Directive information Advanced Directives 11/26/2017  Does Patient Have a Medical Advance Directive? Yes  Type of Advance Directive (No Data)  Does patient want to make changes to medical advance directive? No - Patient declined  Copy of [place] in Chart? No - copy requested  Would patient like information on creating a medical advance directive? No - Patient declined  Pre-existing out of facility DNR order (yellow form or pink MOST form) -     Chief complaint acute visit to follow-up left arm hematoma-also reinitiation of Coumadin   HPI:  Pt is a 72 y.o. female seen today for an acute visit for follow-up of a left arm hematoma.  Patient has a complicated medical history including end-stage renal disease now on dialysis.  Also recent hospitalization for pneumonia and COPD.  She also has a history of diabetes as well as hyperlipidemia chronic diastolic CHF atrial fibrillation and systemic lupus as well as hypertension.--She is on Coumadin with her history of A. fib  Over the weekend  she developed what appeared to be a fairly large hematoma of her left upper arm and axilla area-she went to the ER where it was diagnosis most likely hematoma-.  INR in the ER was 2.68.  Testing in the ER also revealed a guaiac positive stool.  We have been holding her Coumadin-per Dr.Gupta goal INR will be around 2 trying to minimize any possibility of further bleeding.  We have also ordered a GI consult which is pending  Her INR today is down to 1.28-hemoglobin is 8.0 recent hemoglobins have been in the low 8's--and apparently she is receiving injections at dialysis for her anemia.-She has also been followed at the cancer center  Currently this evening she has no acute complaints she has just returned from dialysis- She does have a history of type 2 diabetes she is on Levemir 10 units a day as well as Humalog sliding scale with meals-  morning sugar today was 79 and at noon was 94-recent morning sugars have ranged from 79-194-later in the day more variability ranging from the lower 100s to occasionally over 200 but recently mainly in the mid 100s.  .      Past Medical History:  Diagnosis Date  . Anemia   . Arthritis   . Asthma   . Atrial fibrillation ([place])   . Atrial flutter [facility])    TEE/DCCV December 2013 - on Xarelto [facility])  . Bone spur of toe    Right 5th toe  . Chronic diastolic CHF (congestive heart failure) (HCC)    a. EF 50-55% by echo in 2015  . CKD (chronic kidney disease) stage 4, GFR 15-29 ml/min (HCC)    a. s/p  fistula placement but not on HD. Followed by Nephrology.   Marland Kitchen COPD (chronic obstructive pulmonary disease) ([place])   . DVT of upper extremity (deep vein thrombosis) ([place])    Right basilic vein, June 8182  . Essential hypertension, benign   . Fractures   . SLE (systemic lupus erythematosus) ([place])   . Type 2 diabetes mellitus ([place])   . UTI (lower urinary tract infection)    Past Surgical History:  Procedure Laterality Date  . ABDOMINAL HYSTERECTOMY  1983    . ANKLE SURGERY  1993  . AV FISTULA PLACEMENT Left 03/27/2014   Procedure: ARTERIOVENOUS (AV) FISTULA CREATION;  Surgeon: Rosetta Posner, MD;  Location: [facility];  Service: Vascular;  Laterality: Left;  . BACK SURGERY  1980  . CARDIOVASCULAR STRESS TEST  12/19/2009   no stress induced rhythm abnormalities, ekg negative for ischemia  . CARDIOVERSION  09/16/2012   Procedure: CARDIOVERSION;  Surgeon: Sanda Klein, MD;  Location[facility] ENDOSCOPY;  Service: Cardiovascular;  Laterality: N/A;  . COLONOSCOPY  09/20/2012   Dr. [name]: multiple tubular adenomas   . COLONOSCOPY  2005   Dr. [name]. Polyps, path unknown   . COLONOSCOPY N/A 07/24/2016   Procedure: COLONOSCOPY;  Surgeon: [name], MD;  [facility];  Service: Endoscopy;  Laterality: N/A;  9:00 am  . ESOPHAGOGASTRODUODENOSCOPY  09/20/2012   Dr. [name]: duodenal erosion and possible resolving ulcer at the angularis   . IR FLUORO GUIDE CV LINE RIGHT  11/09/2017  . IR US GUIDE VASC ACCESS RIGHT  11/09/2017  . TEE WITHOUT CARDIOVERSION  09/16/2012   Procedure: TRANSESOPHAGEAL ECHOCARDIOGRAM (TEE);  Surgeon: Sanda Klein, MD;  Location: [facility] ENDOSCOPY;  Service: Cardiovascular;  Laterality: N/A;  . TRANSESOPHAGEAL ECHOCARDIOGRAM WITH CARDIOVERSION  09/16/2012   EF 60-65%, moderate LVH, moderate regurg of the aortic valve, LA moderately dilated    Allergies  Allergen Reactions  . Ace Inhibitors Cough    Outpatient Encounter Medications as of 11/26/2017  Medication Sig  . acetaminophen (TYLENOL) 500 MG tablet Take 1,000 mg by mouth every 6 (six) hours as needed. For pain  . albuterol (ACCUNEB) 0.63 MG/3ML nebulizer solution Take 1 ampule by nebulization every 6 (six) hours as needed for wheezing or shortness of breath.   Marland Kitchen albuterol (PROVENTIL HFA;VENTOLIN HFA) 108 (90 BASE) MCG/ACT inhaler Inhale 2 puffs into the lungs every 6 (six) hours as needed. For shortness of breath  . amLODipine (NORVASC) 10 MG tablet Take 1 tablet (10  mg total) by mouth at bedtime.  Marland Kitchen atorvastatin (LIPITOR) 40 MG tablet Take 40 mg at bedtime by mouth.   . brimonidine-timolol (COMBIGAN) 0.2-0.5 % ophthalmic solution Place 1 drop into the left eye daily.   . calcium acetate (PHOSLO) 667 MG capsule Take 2 capsules (1,334 mg total) by mouth 3 (three) times daily with meals.  . diclofenac (VOLTAREN) 0.1 % ophthalmic solution Place 1 drop into the left eye daily. Uses at lunch time  . dicyclomine (BENTYL) 10 MG capsule Take 10 mg by mouth 4 (four) times daily -  before meals and at bedtime.  . febuxostat (ULORIC) 40 MG tablet Take 40 mg by mouth daily.  . folic acid (FOLVITE) 1 MG tablet Take 1 mg by mouth daily.  Marland Kitchen HYDROcodone-acetaminophen (NORCO/VICODIN) 5-325 MG tablet Take 1-2 tablets by mouth every 8 (eight) hours as needed for moderate pain.  . hydroxychloroquine (PLAQUENIL) 200 MG tablet Take 300 mg by mouth as directed. Take 1 1/2 tablets daily  . Insulin Detemir (  LEVEMIR FLEXTOUCH) 100 UNIT/ML Pen Inject 10 Units into the skin daily at 10 pm.  . insulin lispro (HUMALOG KWIKPEN) 100 UNIT/ML KiwkPen Give per sliding scale from 2-10 units subcutaneous three times a day.  . metoprolol tartrate (LOPRESSOR) 50 MG tablet Take 50 mg by mouth at bedtime.  Marland Kitchen METOPROLOL TARTRATE PO Take 1 tablet by mouth except dialysis days which are Tuesday,Thursday, and Saturday mornings  . Multiple Vitamins-Minerals (CENTRUM SILVER PO) Take 1 tablet by mouth daily.  . ONE TOUCH ULTRA TEST test strip TEST 4 TIMES A DAY AS DIRECTED.  Marland Kitchen ONETOUCH DELICA LANCETS 98X MISC USE TO TEST 4 TIMES DAILY.  Marland Kitchen predniSONE (DELTASONE) 5 MG tablet Take 5 mg by mouth daily with breakfast.  . travoprost, benzalkonium, (TRAVATAN) 0.004 % ophthalmic solution Place 1 drop into the left eye at bedtime.  Marland Kitchen UNIFINE PENTIPS 31G X 6 MM MISC USE AS DIRECTED AT BEDTIME WITH LEVEMIR AND WITH SLIDING SCALE INSULIN.  Marland Kitchen Vitamin D, Ergocalciferol, (DRISDOL) 50000 units CAPS capsule TAKE 1 CAPSULE  BY MOUTH ONCE WEEKLY.   No facility-administered encounter medications on file as of 11/26/2017.     Review of Systems   In general she is not complaining of fever chills says she feels relatively well.  Skin does not complain of rashes or itching.--Does have a large hematoma underside of her left arm that appears to be improving   Head ears eyes nose mouth and throat-does not complain of visual changes or difficulty swallowing.  Respiratory is not complaining shortness of breath or cough.  Cardiac does not complain of chest pain or significant lower extremity edema.  GI continues with somewhat chronic right-sided abdominal pain which has been unchanged here for a fairly extended period of time otherwise she is not complaining of any nausea vomiting diarrhea constipation appears to have eaten almost all her supper this evening  Musculoskeletal at times.  Complains of joint pain generalized discomfort but is not complaining of that today.  Neurologic is not complaining of dizziness headache or syncope.  And psych he is in good spirits is not complaining of any anxiety or depression    Immunization History  Administered Date(s) Administered  . Influenza,inj,Quad PF,6+ Mos 07/03/2014  . Influenza-Unspecified 07/11/2015, 07/08/2016  . PPD Test 11/07/2017  . Pneumococcal Polysaccharide-23 11/04/2017   Pertinent  Health Maintenance Due  Topic Date Due  . FOOT EXAM  12/14/2017 (Originally 11/27/1955)  . OPHTHALMOLOGY EXAM  12/14/2017 (Originally 03/28/2016)  . URINE MICROALBUMIN  12/14/2017 (Originally 11/27/1955)  . DEXA SCAN  12/14/2017 (Originally 11/26/2010)  . INFLUENZA VACCINE  07/08/2018 (Originally 04/29/2017)  . HEMOGLOBIN A1C  04/19/2018  . PNA vac Low Risk Adult (2 of 2 - PCV13) 11/04/2018  . MAMMOGRAM  09/29/2019  . COLONOSCOPY  07/24/2026   Fall Risk  10/27/2017 07/24/2017 01/16/2017 07/17/2016 04/09/2016  Falls in the past year? No No No No No   Functional Status  Survey:   She is afebrile pulse of 72 respirations o of 18- blood pressure taken manually 136/72   Physical Exam   In general this is a pleasant elderly female in no distress sitting comfortably in her wheelchair.  Her skin is warm and dry on the underside of her upper left arm extending to the axilla is a hematoma but it is soft with really no significant elevation except right as it approaches the axilla area there is a small area of firmness---this appears to be stable to somewhat improved from exam earlier this week  There is no tenderness or warmth.  Eyes sclera and conjunctive are clear visual acuity appears to be intact.    Chest is clear to auscultation there is no labored breathing.  Heart isy regular rate and rhythm with an occasional irregular beat- she has quite mild lower extremity edema.  Abdomen is soft with positive bowel sounds continues to have some tenderness to palpation on the right side which is chronic.  Musculoskeletal is able to move all extremities x4 at baseline.  Neurologic appears grossly intact without lateralizing findings with her speech is clear cranial nerves appear to be intact.  Psych she is alert and oriented pleasant and appropriate    Labs reviewed: Recent Labs    11/06/17 0547  11/09/17 0459 11/10/17 0507 11/11/17 0415  11/17/17 0706 11/19/17 0713 11/26/17 0704  NA 134*   < > 138 141 136   < > 135 136 136  K 4.1   < > 4.0 3.9 3.8   < > 3.5 3.8 4.2  CL 99*   < > 100* 100* 96*   < > 97* 98* 100*  CO2 20*   < > '24 30 27   ' < > '26 28 25  ' GLUCOSE 155*   < > 442* 185* 334*   < > 183* 89 59*  BUN 128*   < > 152* 97* 69*   < > 41* 29* 28*  CREATININE 4.56*   < > 4.08* 2.87* 2.85*   < > 3.21* 3.93* 3.37*  CALCIUM 8.6*   < > 8.7* 8.5* 8.3*   < > 7.8* 8.4* 8.6*  MG 2.4  --   --   --   --   --   --   --   --   PHOS 7.0*   < > 5.5* 4.4 4.2  --   --   --   --    < > = values in this interval not displayed.   Recent Labs    10/20/17 1029  10/21/17 1027 11/02/17 1536  11/09/17 0459 11/10/17 0507 11/11/17 0415  AST 24 28 37  --   --   --   --   ALT '24 25 27  ' --   --   --   --   ALKPHOS 69 56 108  --   --   --   --   BILITOT 0.2 0.3 0.6  --   --   --   --   PROT 6.6 7.1 7.4  --   --   --   --   ALBUMIN 3.3* 2.8* 2.3*   < > 2.0* 2.3* 2.1*   < > = values in this interval not displayed.   Recent Labs    11/15/17 0500 11/17/17 0706 11/19/17 0713 11/21/17 0830 11/26/17 0704  WBC 15.5* 11.5* 9.5 7.2 6.2  NEUTROABS 13.1* 9.7*  --  5.7  --   HGB 10.4* 9.5* 8.4* 8.3* 8.0*  HCT 33.4* 30.8* 27.6* 27.7* 27.5*  MCV 89.3 90.3 92.0 93.0 96.2  PLT 277 222 200 164 200   Lab Results  Component Value Date   TSH 2.360 09/19/2015   Lab Results  Component Value Date   HGBA1C 5.6 10/20/2017   No results found for: CHOL, HDL, LDLCALC, LDLDIRECT, TRIG, CHOLHDL  Significant Diagnostic Results in last 30 days:  Ct Abdomen Pelvis Wo Contrast  Result Date: 11/02/2017 CLINICAL DATA:  Emesis for the last 3 days.  Intermittent fever. EXAM: CT ABDOMEN AND PELVIS  WITHOUT CONTRAST TECHNIQUE: Multidetector CT imaging of the abdomen and pelvis was performed following the standard protocol without IV contrast. COMPARISON:  09/09/2017.  08/31/2017. FINDINGS: Lower chest: There is a small left effusion. There is segmental consolidation in the left lower lobe suggesting pneumonia. The heart is mildly enlarged. Hepatobiliary: No liver parenchymal abnormality is seen. There is some layering sludge or stones in the gallbladder. No CT evidence of cholecystitis. Pancreas: Normal Spleen: Normal Adrenals/Urinary Tract: Adrenal glands are normal. Kidneys show vascular calcifications but no acute finding. Bladder appears normal. Stomach/Bowel: No acute bowel pathology is seen. No sign of obstruction or inflammatory disease. Vascular/Lymphatic: Advanced aortic atherosclerosis. Atherosclerosis of the aortic branch vessels. IVC is normal. No retroperitoneal  adenopathy. Reproductive: Previous hysterectomy.  No pelvic mass. Other: No free fluid or air. Musculoskeletal: Chronic lumbar degenerative changes. No acute bone finding. IMPRESSION: Segmental consolidation in the left lower lobe consistent with bronchopneumonia. Small amount of left pleural fluid. Sludge or gallstones in the gallbladder. Aortic atherosclerosis. No acute bowel pathology seen. No obstruction or inflammatory finding. Electronically Signed   By: Nelson Chimes M.D.   On: 11/02/2017 18:28   Dg Tibia/fibula Right  Result Date: 11/02/2017 CLINICAL DATA:  Right lower leg ulcers. EXAM: RIGHT TIBIA AND FIBULA - 2 VIEW COMPARISON:  None. FINDINGS: There is no evidence of fracture or other focal bone lesions. Surgical screw is seen in distal right fibula. Vascular calcifications are noted. No lytic destruction is seen to suggest osteomyelitis. IMPRESSION: No fracture or dislocation is noted. No definite evidence of osteomyelitis. Electronically Signed   By: Marijo Conception, M.D.   On: 11/02/2017 17:30   US Abdomen Complete  Result Date: 11/03/2017 CLINICAL DATA:  Emesis for 3 days and intermittent fever. Chronic abdominal pain. EXAM: ABDOMEN ULTRASOUND COMPLETE COMPARISON:  Abdominal CT 11/02/2017 FINDINGS: Gallbladder: Small amount of echogenic sludge within the gallbladder. No gallbladder wall distention or wall thickening. Reportedly, the patient does not have a sonographic Murphy sign. No gallstones. Common bile duct: Diameter: 0.4 cm. Liver: No focal lesion identified. Within normal limits in parenchymal echogenicity. Portal vein is patent on color Doppler imaging with normal direction of blood flow towards the liver. IVC: No abnormality visualized. Pancreas: Visualized portion unremarkable. Spleen: Size and appearance within normal limits. Right Kidney: Length: 11.6 cm. Increased echogenicity in the right kidney without hydronephrosis. There is a indeterminate hypoechoic structure along the lateral  upper pole region. This structure measures 2.6 x 2.4 x 2.8 cm. Structure may be partially cystic but cannot exclude a solid component. Review of the prior non contrast CT demonstrates minimal fullness in this area but there is not an obvious lesion based on the non contrast images. Left Kidney: Length: 10.1 cm. Echogenicity within normal limits. No mass or hydronephrosis visualized. Abdominal aorta: No aneurysm visualized. Other findings: None. IMPRESSION: Indeterminate right renal lesion measuring up to 2.8 cm. This could represent a complex cystic lesion but cannot exclude a solid component. This lesion is not well characterized on the prior non contrast CTs. Patient is not a candidate for contrast enhanced studies due to renal function but this lesion could be further characterized with a non contrast renal MRI. Gallbladder sludge without stones or biliary dilatation. Electronically Signed   By: Markus Daft M.D.   On: 11/03/2017 11:50   Ir Fluoro Guide Cv Line Right  Result Date: 11/09/2017 INDICATION: Acute on chronic renal failure. Please place tunneled dialysis catheter for the initiation of dialysis. EXAM: TUNNELED CENTRAL VENOUS HEMODIALYSIS CATHETER  PLACEMENT WITH ULTRASOUND AND FLUOROSCOPIC GUIDANCE MEDICATIONS: Ancef 2 gm IV . The antibiotic was given in an appropriate time interval prior to skin puncture. ANESTHESIA/SEDATION: Versed 1.5 mg IV; Fentanyl 50 mcg IV; Moderate Sedation Time: 13 minutes The patient was continuously monitored during the procedure by the interventional radiology nurse under my direct supervision. FLUOROSCOPY TIME:  Fluoroscopy Time: 30 seconds (3 mGy). COMPLICATIONS: None immediate. PROCEDURE: Informed written consent was obtained from the patient after a discussion of the risks, benefits, and alternatives to treatment. Questions regarding the procedure were encouraged and answered. The right neck and chest were prepped with chlorhexidine in a sterile fashion, and a sterile  drape was applied covering the operative field. Maximum barrier sterile technique with sterile gowns and gloves were used for the procedure. A timeout was performed prior to the initiation of the procedure. After creating a small venotomy incision, a micropuncture kit was utilized to access the internal jugular vein. Real-time ultrasound guidance was utilized for vascular access including the acquisition of a permanent ultrasound image documenting patency of the accessed vessel. The microwire was utilized to measure appropriate catheter length. A stiff Glidewire was advanced to the level of the IVC and the micropuncture sheath was exchanged for a peel-away sheath. A palindrome tunneled hemodialysis catheter measuring 19 cm from tip to cuff was tunneled in a retrograde fashion from the anterior chest wall to the venotomy incision. The catheter was then placed through the peel-away sheath with tips ultimately positioned within the superior aspect of the right atrium. Final catheter positioning was confirmed and documented with a spot radiographic image. The catheter aspirates and flushes normally. The catheter was flushed with appropriate volume heparin dwells. The catheter exit site was secured with a 0-Prolene retention suture. The venotomy incision was closed with an interrupted 4-0 Vicryl, Dermabond and Steri-strips. Dressings were applied. The patient tolerated the procedure well without immediate post procedural complication. IMPRESSION: Successful placement of 19 cm tip to cuff tunneled hemodialysis catheter via the right internal jugular vein with tips terminating within the superior aspect of the right atrium. The catheter is ready for immediate use. Electronically Signed   By: Sandi Mariscal M.D.   On: 11/09/2017 14:01   Ir US Guide Vasc Access Right  Result Date: 11/09/2017 INDICATION: Acute on chronic renal failure. Please place tunneled dialysis catheter for the initiation of dialysis. EXAM: TUNNELED  CENTRAL VENOUS HEMODIALYSIS CATHETER PLACEMENT WITH ULTRASOUND AND FLUOROSCOPIC GUIDANCE MEDICATIONS: Ancef 2 gm IV . The antibiotic was given in an appropriate time interval prior to skin puncture. ANESTHESIA/SEDATION: Versed 1.5 mg IV; Fentanyl 50 mcg IV; Moderate Sedation Time: 13 minutes The patient was continuously monitored during the procedure by the interventional radiology nurse under my direct supervision. FLUOROSCOPY TIME:  Fluoroscopy Time: 30 seconds (3 mGy). COMPLICATIONS: None immediate. PROCEDURE: Informed written consent was obtained from the patient after a discussion of the risks, benefits, and alternatives to treatment. Questions regarding the procedure were encouraged and answered. The right neck and chest were prepped with chlorhexidine in a sterile fashion, and a sterile drape was applied covering the operative field. Maximum barrier sterile technique with sterile gowns and gloves were used for the procedure. A timeout was performed prior to the initiation of the procedure. After creating a small venotomy incision, a micropuncture kit was utilized to access the internal jugular vein. Real-time ultrasound guidance was utilized for vascular access including the acquisition of a permanent ultrasound image documenting patency of the accessed vessel. The microwire was utilized to  measure appropriate catheter length. A stiff Glidewire was advanced to the level of the IVC and the micropuncture sheath was exchanged for a peel-away sheath. A palindrome tunneled hemodialysis catheter measuring 19 cm from tip to cuff was tunneled in a retrograde fashion from the anterior chest wall to the venotomy incision. The catheter was then placed through the peel-away sheath with tips ultimately positioned within the superior aspect of the right atrium. Final catheter positioning was confirmed and documented with a spot radiographic image. The catheter aspirates and flushes normally. The catheter was flushed with  appropriate volume heparin dwells. The catheter exit site was secured with a 0-Prolene retention suture. The venotomy incision was closed with an interrupted 4-0 Vicryl, Dermabond and Steri-strips. Dressings were applied. The patient tolerated the procedure well without immediate post procedural complication. IMPRESSION: Successful placement of 19 cm tip to cuff tunneled hemodialysis catheter via the right internal jugular vein with tips terminating within the superior aspect of the right atrium. The catheter is ready for immediate use. Electronically Signed   By: Sandi Mariscal M.D.   On: 11/09/2017 14:01   Dg Chest Port 1 View  Result Date: 11/03/2017 CLINICAL DATA:  Respiratory failure EXAM: PORTABLE CHEST 1 VIEW COMPARISON:  November 12, 2017 FINDINGS: There is stable cardiomegaly with mild pulmonary venous hypertension. There is atelectatic change in the left base. No frank edema or consolidation evident. There is aortic atherosclerosis. No bone lesions appreciable. IMPRESSION: Evidence of a degree of pulmonary vascular congestion without frank edema or consolidation. There is left base atelectasis. There is aortic atherosclerosis. Aortic Atherosclerosis (ICD10-I70.0). Electronically Signed   By: Lowella Grip III M.D.   On: 11/03/2017 08:37   Dg Chest Port 1 View  Result Date: 11/02/2017 CLINICAL DATA:  Shortness of breath and cough. EXAM: PORTABLE CHEST 1 VIEW COMPARISON:  August 13, 2017 FINDINGS: Stable cardiomegaly. Mild edema. No other interval changes or acute abnormalities. IMPRESSION: Cardiomegaly and mild edema. Electronically Signed   By: Dorise Bullion III M.D   On: 11/02/2017 19:11    Assessment/Plan  #1 left axilla hematoma-this appears to be stable to slowly improving--she is still applying ice- at this point will monitor.  2.  History of atrial fibrillation on chronic Coumadin- Coumadin has been held because of the hematoma-she has  also had a guaiac positive stool in the  ER-this was discussed with Dr.Gupta - will cautiously restart Coumadin at 3 mg- and update an INR on Monday, March 4.  Goal INR is 2-we are also pursuing a GI consult.  Will also start her on a proton pump inhibitor.  She continues on Lopressor for rate control  3.  Anemia suspect there is an element of chronic disease with her renal issues recent hemoglobins have been more in the lower 8s-she does receive apparently injections at dialysis-will have staff check on this with dialysis to see when her next scheduled treatment is-- also will update a CBC on Monday, March 4.  4.  History of diabetes type 2 some concern here with her lower blood sugars in the morning will decrease her Levemir to 7 units and monitor she does have sliding scale Humalog with meals-at this point will monitor with the lower Levemir dose and will make adjustments if blood sugars are running higher again  .  [id number] note greater than 25 minutes spent assessing patient-reviewing her chart labs- and coordinating and formulating a plan of care for numerous diagnoses- of note   greater than 50% of time spent  coordinating plan of care

## 2017-11-27 DIAGNOSIS — E1129 Type 2 diabetes mellitus with other diabetic kidney complication: Secondary | ICD-10-CM | POA: Diagnosis not present

## 2017-11-27 DIAGNOSIS — N186 End stage renal disease: Secondary | ICD-10-CM | POA: Diagnosis not present

## 2017-11-27 DIAGNOSIS — Z992 Dependence on renal dialysis: Secondary | ICD-10-CM | POA: Diagnosis not present

## 2017-11-28 DIAGNOSIS — E876 Hypokalemia: Secondary | ICD-10-CM | POA: Diagnosis not present

## 2017-11-28 DIAGNOSIS — D509 Iron deficiency anemia, unspecified: Secondary | ICD-10-CM | POA: Diagnosis not present

## 2017-11-28 DIAGNOSIS — D631 Anemia in chronic kidney disease: Secondary | ICD-10-CM | POA: Diagnosis not present

## 2017-11-28 DIAGNOSIS — N2581 Secondary hyperparathyroidism of renal origin: Secondary | ICD-10-CM | POA: Diagnosis not present

## 2017-11-28 DIAGNOSIS — D689 Coagulation defect, unspecified: Secondary | ICD-10-CM | POA: Diagnosis not present

## 2017-11-28 DIAGNOSIS — N186 End stage renal disease: Secondary | ICD-10-CM | POA: Diagnosis not present

## 2017-11-30 ENCOUNTER — Encounter (HOSPITAL_COMMUNITY)
Admission: RE | Admit: 2017-11-30 | Discharge: 2017-11-30 | Disposition: A | Discharge status: Home / Self Care | Payer: Medicare Other | Source: Ambulatory Visit | Attending: Oncology | Admitting: Oncology

## 2017-11-30 DIAGNOSIS — N184 Chronic kidney disease, stage 4 (severe): Secondary | ICD-10-CM | POA: Insufficient documentation

## 2017-11-30 DIAGNOSIS — D631 Anemia in chronic kidney disease: Secondary | ICD-10-CM | POA: Insufficient documentation

## 2017-11-30 LAB — CBC WITH DIFFERENTIAL/PLATELET
Basophils Absolute: 0 10*3/uL (ref 0.0–0.1)
Basophils Relative: 0 %
EOS PCT: 2 %
Eosinophils Absolute: 0.1 10*3/uL (ref 0.0–0.7)
HEMATOCRIT: 26 % — AB (ref 36.0–46.0)
Hemoglobin: 7.4 g/dL — ABNORMAL LOW (ref 12.0–15.0)
LYMPHS ABS: 0.9 10*3/uL (ref 0.7–4.0)
Lymphocytes Relative: 15 %
MCH: 27.7 pg (ref 26.0–34.0)
MCHC: 28.5 g/dL — AB (ref 30.0–36.0)
MCV: 97.4 fL (ref 78.0–100.0)
Monocytes Absolute: 0.6 10*3/uL (ref 0.1–1.0)
Monocytes Relative: 10 %
NEUTROS ABS: 4.5 10*3/uL (ref 1.7–7.7)
Neutrophils Relative %: 73 %
Platelets: 324 10*3/uL (ref 150–400)
RBC: 2.67 MIL/uL — ABNORMAL LOW (ref 3.87–5.11)
RDW: 18.7 % — AB (ref 11.5–15.5)
WBC: 6.1 10*3/uL (ref 4.0–10.5)

## 2017-11-30 LAB — BASIC METABOLIC PANEL
ANION GAP: 10 (ref 5–15)
BUN: 37 mg/dL — ABNORMAL HIGH (ref 6–20)
CHLORIDE: 98 mmol/L — AB (ref 101–111)
CO2: 28 mmol/L (ref 22–32)
Calcium: 8.8 mg/dL — ABNORMAL LOW (ref 8.9–10.3)
Creatinine, Ser: 3.56 mg/dL — ABNORMAL HIGH (ref 0.44–1.00)
GFR calc Af Amer: 14 mL/min — ABNORMAL LOW (ref >60)
GFR calc non Af Amer: 12 mL/min — ABNORMAL LOW (ref >60)
GLUCOSE: 186 mg/dL — AB (ref 65–99)
Potassium: 4 mmol/L (ref 3.5–5.1)
SODIUM: 136 mmol/L (ref 135–145)

## 2017-11-30 LAB — PROTIME-INR
INR: 1.06
Prothrombin Time: 13.7 seconds (ref 11.4–15.2)

## 2017-12-01 ENCOUNTER — Encounter: Payer: Self-pay | Admitting: Internal Medicine

## 2017-12-01 ENCOUNTER — Non-Acute Institutional Stay (SKILLED_NURSING_FACILITY): Payer: Medicare Other | Admitting: Internal Medicine

## 2017-12-01 DIAGNOSIS — Z992 Dependence on renal dialysis: Secondary | ICD-10-CM | POA: Diagnosis not present

## 2017-12-01 DIAGNOSIS — L97219 Non-pressure chronic ulcer of right calf with unspecified severity: Secondary | ICD-10-CM

## 2017-12-01 DIAGNOSIS — I872 Venous insufficiency (chronic) (peripheral): Secondary | ICD-10-CM

## 2017-12-01 DIAGNOSIS — N186 End stage renal disease: Secondary | ICD-10-CM | POA: Diagnosis not present

## 2017-12-01 DIAGNOSIS — D509 Iron deficiency anemia, unspecified: Secondary | ICD-10-CM | POA: Diagnosis not present

## 2017-12-01 DIAGNOSIS — J439 Emphysema, unspecified: Secondary | ICD-10-CM

## 2017-12-01 DIAGNOSIS — D508 Other iron deficiency anemias: Secondary | ICD-10-CM

## 2017-12-01 DIAGNOSIS — N2581 Secondary hyperparathyroidism of renal origin: Secondary | ICD-10-CM | POA: Diagnosis not present

## 2017-12-01 DIAGNOSIS — E876 Hypokalemia: Secondary | ICD-10-CM | POA: Diagnosis not present

## 2017-12-01 DIAGNOSIS — L03115 Cellulitis of right lower limb: Secondary | ICD-10-CM

## 2017-12-01 DIAGNOSIS — R195 Other fecal abnormalities: Secondary | ICD-10-CM

## 2017-12-01 DIAGNOSIS — D689 Coagulation defect, unspecified: Secondary | ICD-10-CM | POA: Diagnosis not present

## 2017-12-01 DIAGNOSIS — Z794 Long term (current) use of insulin: Secondary | ICD-10-CM

## 2017-12-01 DIAGNOSIS — E1140 Type 2 diabetes mellitus with diabetic neuropathy, unspecified: Secondary | ICD-10-CM | POA: Diagnosis not present

## 2017-12-01 DIAGNOSIS — D631 Anemia in chronic kidney disease: Secondary | ICD-10-CM | POA: Diagnosis not present

## 2017-12-01 NOTE — Progress Notes (Signed)
Location:   [facility] Room Number: 062/B Place of Service:  SNF (31) Provider:  Edd Fabian, MD  Patient Care Team: Sharilyn Sites, MD as PCP - General (Family Medicine) Satira Sark, MD as Consulting Physician (Cardiology) Donato Heinz, MD as Consulting Physician (Nephrology) Corliss Parish, MD as Consulting Physician (Nephrology) Early, Arvilla Meres, MD as Consulting Physician (Vascular Surgery) Farrel Gobble, MD (Inactive) as Consulting Physician (Hematology and Oncology) [name], MD as Consulting Physician (Gastroenterology)  Extended Emergency Contact Information Primary Emergency Contact: [facility] Address: [address] [place]          [place], [place] [address] [place] Phone: [phone] Mobile Phone: [phone] Relation: Spouse  Code Status:  Full Code Goals of care: Advanced Directive information Advanced Directives 12/01/2017  Does Patient Have a Medical Advance Directive? Yes  Type of Advance Directive (No Data)  Does patient want to make changes to medical advance directive? No - Patient declined  Copy of [place] in Chart? No - copy requested  Would patient like information on creating a medical advance directive? No - Patient declined  Pre-existing out of facility DNR order (yellow form or pink MOST form) -     Chief Complaint  Patient presents with  . Acute Visit    Patients c/o Pain and redneaa of the right leg    HPI:  Pt is a 72 y.o. female seen today for an acute visit for Pain in her Right Leg Patient has h/o ESRD started on Dialysis this admission, COPD on Chronic Prednisone, Diabetes Mellitus, Hyperlipidemia, Chronic Diastolic failure EF 55 % in 02/19, Paroxysmal Atrial Fibrillation, Systemic Lupus, Hypertension,   Patient has h/o Chronic Venous stasis Ulcer in Right LE.Marland Kitchen Over the weekend patient has noticed increased pain in her Leg. Wound  care Nurse also noticed some redness around the wound and the base of the ulcer also has some slough. Patient Continues to be afebrile. Her other problems include Anemia with Hgb of 7.4. She has been Occult positive before and has follow up with Gastro.She also Continues to have Right abdominal pain. Her CT scan in Hospital has been negative. She is suppose to follow with Gastro.  Past Medical History:  Diagnosis Date  . Anemia   . Arthritis   . Asthma   . Atrial fibrillation ([place])   . Atrial flutter [facility])    TEE/DCCV December 2013 - on Xarelto [facility])  . Bone spur of toe    Right 5th toe  . Chronic diastolic CHF (congestive heart failure) (HCC)    a. EF 50-55% by echo in 2015  . CKD (chronic kidney disease) stage 4, GFR 15-29 ml/min (HCC)    a. s/p fistula placement but not on HD. Followed by Nephrology.   Marland Kitchen COPD (chronic obstructive pulmonary disease) ([place])   . DVT of upper extremity (deep vein thrombosis) ([place])    Right basilic vein, June 4627  . Essential hypertension, benign   . Fractures   . SLE (systemic lupus erythematosus) ([place])   . Type 2 diabetes mellitus ([place])   . UTI (lower urinary tract infection)    Past Surgical History:  Procedure Laterality Date  . ABDOMINAL HYSTERECTOMY  1983  . ANKLE SURGERY  1993  . AV FISTULA PLACEMENT Left 03/27/2014   Procedure: ARTERIOVENOUS (AV) FISTULA CREATION;  Surgeon: Rosetta Posner, MD;  Location: [facility];  Service: Vascular;  Laterality: Left;  . BACK SURGERY  1980  .  CARDIOVASCULAR STRESS TEST  12/19/2009   no stress induced rhythm abnormalities, ekg negative for ischemia  . CARDIOVERSION  09/16/2012   Procedure: CARDIOVERSION;  Surgeon: Sanda Klein, MD;  Location: [facility] ENDOSCOPY;  Service: Cardiovascular;  Laterality: N/A;  . COLONOSCOPY  09/20/2012   Dr. [name]: multiple tubular adenomas   . COLONOSCOPY  2005   Dr. [name]. Polyps, path unknown   . COLONOSCOPY N/A 07/24/2016   Procedure: COLONOSCOPY;  Surgeon: [name],  MD;  Location: [facility] ENDO SU[facility];  Service: Endoscopy;  Laterality: N/A;  9:00 am  . ESOPHAGOGASTRODUODENOSCOPY  09/20/2012   Dr. [name]: duodenal erosion and possible resolving ulcer at the angularis   . IR FLUORO GUIDE CV LINE RIGHT  11/09/2017  . IR US GUIDE VASC ACCESS RIGHT  11/09/2017  . TEE WITHOUT CARDIOVERSION  09/16/2012   Procedure: TRANSESOPHAGEAL ECHOCARDIOGRAM (TEE);  Surgeon: Sanda Klein, MD;  Location: [facility] ENDOSCOPY;  Service: Cardiovascular;  Laterality: N/A;  . TRANSESOPHAGEAL ECHOCARDIOGRAM WITH CARDIOVERSION  09/16/2012   EF 60-65%, moderate LVH, moderate regurg of the aortic valve, LA moderately dilated    Allergies  Allergen Reactions  . Ace Inhibitors Cough    Outpatient Encounter Medications as of 12/01/2017  Medication Sig  . acetaminophen (TYLENOL) 500 MG tablet Take 1,000 mg by mouth every 6 (six) hours as needed. For pain  . albuterol (ACCUNEB) 0.63 MG/3ML nebulizer solution Take 1 ampule by nebulization every 6 (six) hours as needed for wheezing or shortness of breath.   Marland Kitchen albuterol (PROVENTIL HFA;VENTOLIN HFA) 108 (90 BASE) MCG/ACT inhaler Inhale 2 puffs into the lungs every 6 (six) hours as needed. For shortness of breath  . amLODipine (NORVASC) 10 MG tablet Take 1 tablet (10 mg total) by mouth at bedtime.  Marland Kitchen atorvastatin (LIPITOR) 40 MG tablet Take 40 mg at bedtime by mouth.   . brimonidine-timolol (COMBIGAN) 0.2-0.5 % ophthalmic solution Place 1 drop into the left eye daily.   . calcium acetate (PHOSLO) 667 MG capsule Take 2 capsules (1,334 mg total) by mouth 3 (three) times daily with meals.  . collagenase (SANTYL) ointment Apply 1 application topically daily.  . diclofenac (VOLTAREN) 0.1 % ophthalmic solution Place 1 drop into the left eye daily. Uses at lunch time  . dicyclomine (BENTYL) 10 MG capsule Take 10 mg by mouth 4 (four) times daily -  before meals and at bedtime.  . febuxostat (ULORIC) 40 MG tablet Take 40 mg by mouth daily.  . folic acid  (FOLVITE) 1 MG tablet Take 1 mg by mouth daily.  Marland Kitchen HYDROcodone-acetaminophen (NORCO/VICODIN) 5-325 MG tablet Take 1-2 tablets by mouth every 8 (eight) hours as needed for moderate pain.  . hydroxychloroquine (PLAQUENIL) 200 MG tablet Take 300 mg by mouth as directed. Take 1 1/2 tablets daily  . Insulin Detemir (LEVEMIR FLEXTOUCH) 100 UNIT/ML Pen Inject 7 Units into the skin daily at 10 pm.  . insulin lispro (HUMALOG KWIKPEN) 100 UNIT/ML KiwkPen Give per sliding scale from 2-10 units subcutaneous three times a day.  . metoprolol tartrate (LOPRESSOR) 50 MG tablet Take 50 mg by mouth at bedtime.  Marland Kitchen METOPROLOL TARTRATE PO Take 1 tablet by mouth except dialysis days which are Tuesday,Thursday, and Saturday mornings  . Multiple Vitamins-Minerals (CENTRUM SILVER PO) Take 1 tablet by mouth daily.  Marland Kitchen omeprazole (PRILOSEC) 20 MG capsule Take 20 mg by mouth daily.  . ONE TOUCH ULTRA TEST test strip TEST 4 TIMES A DAY AS DIRECTED.  Marland Kitchen ONETOUCH DELICA LANCETS 73U MISC USE TO TEST  4 TIMES DAILY.  Marland Kitchen polyethylene glycol (MIRALAX / GLYCOLAX) packet Take 17 g by mouth daily as needed.  . predniSONE (DELTASONE) 5 MG tablet Take 5 mg by mouth daily with breakfast.  . travoprost, benzalkonium, (TRAVATAN) 0.004 % ophthalmic solution Place 1 drop into the left eye at bedtime.  Marland Kitchen UNIFINE PENTIPS 31G X 6 MM MISC USE AS DIRECTED AT BEDTIME WITH LEVEMIR AND WITH SLIDING SCALE INSULIN.  Marland Kitchen Vitamin D, Ergocalciferol, (DRISDOL) 50000 units CAPS capsule TAKE 1 CAPSULE BY MOUTH ONCE WEEKLY.  . warfarin (COUMADIN) 3 MG tablet Take 3 mg by mouth daily.  . [DISCONTINUED] Insulin Detemir (LEVEMIR FLEXTOUCH) 100 UNIT/ML Pen Inject 10 Units into the skin daily at 10 pm. (Patient taking differently: Inject 7 Units into the skin daily at 10 pm. )   No facility-administered encounter medications on file as of 12/01/2017.      Review of Systems  HENT: Negative.   Respiratory: Negative.   Cardiovascular: Positive for leg swelling.    Gastrointestinal: Positive for abdominal pain and constipation.  Genitourinary: Negative.   Musculoskeletal: Negative.   Skin: Positive for wound.  Neurological: Positive for weakness.  Psychiatric/Behavioral: Positive for sleep disturbance.    Immunization History  Administered Date(s) Administered  . Influenza,inj,Quad PF,6+ Mos 07/03/2014  . Influenza-Unspecified 07/11/2015, 07/08/2016  . PPD Test 11/07/2017  . Pneumococcal Polysaccharide-23 11/04/2017   Pertinent  Health Maintenance Due  Topic Date Due  . FOOT EXAM  12/14/2017 (Originally 11/27/1955)  . OPHTHALMOLOGY EXAM  12/14/2017 (Originally 03/28/2016)  . URINE MICROALBUMIN  12/14/2017 (Originally 11/27/1955)  . DEXA SCAN  12/14/2017 (Originally 11/26/2010)  . INFLUENZA VACCINE  07/08/2018 (Originally 04/29/2017)  . HEMOGLOBIN A1C  04/19/2018  . PNA vac Low Risk Adult (2 of 2 - PCV13) 11/04/2018  . MAMMOGRAM  09/29/2019  . COLONOSCOPY  07/24/2026   Fall Risk  10/27/2017 07/24/2017 01/16/2017 07/17/2016 04/09/2016  Falls in the past year? _0    Functional Status Survey:    Vitals:   12/01/17 1753  BP: (!) 108/56  Pulse: 60  Resp: 18  Temp: 98.2 F (36.8 C)   There is no height or weight on file to calculate BMI. Physical Exam  Constitutional: She is oriented to person, place, and time. She appears well-developed and well-nourished.  HENT:  Head: Normocephalic.  Mouth/Throat: Oropharynx is clear and moist.  Eyes: Pupils are equal, round, and reactive to light.  Neck: Neck supple.  Cardiovascular: Normal rate and normal heart sounds. An irregularly irregular rhythm present.  Pulmonary/Chest: Effort normal and breath sounds normal. No respiratory distress. She has no wheezes. She has no rales.  Abdominal: Soft. Bowel sounds are normal. She exhibits no distension. There is no tenderness. There is no rebound.  Musculoskeletal: She exhibits no edema.  Lymphadenopathy:    She has no cervical adenopathy.   Neurological: She is alert and oriented to person, place, and time.  No Deficits  Skin:  Has Venous stasis Ulcer on RLE with Some Slough Also have redness around the Ulcer with pain and mild swelling.  Hematome in Left arm almost resolved  Psychiatric: She has a normal mood and affect. Her behavior is normal. Thought content normal.  Nursing note and vitals reviewed.   Labs reviewed: Recent Labs    11/06/17 0547  11/09/17 0459 11/10/17 0507 11/11/17 0415  11/19/17 0713 11/26/17 0704 11/30/17 0730  NA 134*   < > 138 141 136   < > 136 136 136  K 4.1   < >  4.0 3.9 3.8   < > 3.8 4.2 4.0  CL 99*   < > 100* 100* 96*   < > 98* 100* 98*  CO2 20*   < > _0 < > _1 GLUCOSE 155*   < > 442* 185* 334*   < > 89 59* 186*  BUN 128*   < > 152* 97* 69*   < > 29* 28* 37*  CREATININE 4.56*   < > 4.08* 2.87* 2.85*   < > 3.93* 3.37* 3.56*  CALCIUM 8.6*   < > 8.7* 8.5* 8.3*   < > 8.4* 8.6* 8.8*  MG 2.4  --   --   --   --   --   --   --   --   PHOS 7.0*   < > 5.5* 4.4 4.2  --   --   --   --    < > = values in this interval not displayed.   Recent Labs    10/20/17 1029 10/21/17 1027 11/02/17 1536  11/09/17 0459 11/10/17 0507 11/11/17 0415  AST 24 28 37  --   --   --   --   ALT _2 --   --   --   --   ALKPHOS 69 56 108  --   --   --   --   BILITOT 0.2 0.3 0.6  --   --   --   --   PROT 6.6 7.1 7.4  --   --   --   --   ALBUMIN 3.3* 2.8* 2.3*   < > 2.0* 2.3* 2.1*   < > = values in this interval not displayed.   Recent Labs    11/17/17 0706  11/21/17 0830 11/26/17 0704 11/30/17 0730  WBC 11.5*   < > 7.2 6.2 6.1  NEUTROABS 9.7*  --  5.7  --  4.5  HGB 9.5*   < > 8.3* 8.0* 7.4*  HCT 30.8*   < > 27.7* 27.5* 26.0*  MCV 90.3   < > 93.0 96.2 97.4  PLT 222   < > 164 200 324   < > = values in this interval not displayed.   Lab Results  Component Value Date   TSH 2.360 09/19/2015   Lab Results  Component Value Date   HGBA1C 5.6 10/20/2017   No results found for:  CHOL, HDL, LDLCALC, LDLDIRECT, TRIG, CHOLHDL  Significant Diagnostic Results in last 30 days:  Ct Abdomen Pelvis Wo Contrast  Result Date: 11/02/2017 CLINICAL DATA:  Emesis for the last 3 days.  Intermittent fever. EXAM: CT ABDOMEN AND PELVIS WITHOUT CONTRAST TECHNIQUE: Multidetector CT imaging of the abdomen and pelvis was performed following the standard protocol without IV contrast. COMPARISON:  09/09/2017.  08/31/2017. FINDINGS: Lower chest: There is a small left effusion. There is segmental consolidation in the left lower lobe suggesting pneumonia. The heart is mildly enlarged. Hepatobiliary: No liver parenchymal abnormality is seen. There is some layering sludge or stones in the gallbladder. No CT evidence of cholecystitis. Pancreas: Normal Spleen: Normal Adrenals/Urinary Tract: Adrenal glands are normal. Kidneys show vascular calcifications but no acute finding. Bladder appears normal. Stomach/Bowel: No acute bowel pathology is seen. No sign of obstruction or inflammatory disease. Vascular/Lymphatic: Advanced aortic atherosclerosis. Atherosclerosis of the aortic branch vessels. IVC is normal. No retroperitoneal adenopathy. Reproductive: Previous hysterectomy.  No pelvic mass. Other: No free fluid or air. Musculoskeletal: Chronic  lumbar degenerative changes. No acute bone finding. IMPRESSION: Segmental consolidation in the left lower lobe consistent with bronchopneumonia. Small amount of left pleural fluid. Sludge or gallstones in the gallbladder. Aortic atherosclerosis. No acute bowel pathology seen. No obstruction or inflammatory finding. Electronically Signed   By: Nelson Chimes M.D.   On: 11/02/2017 18:28   Dg Tibia/fibula Right  Result Date: 11/02/2017 CLINICAL DATA:  Right lower leg ulcers. EXAM: RIGHT TIBIA AND FIBULA - 2 VIEW COMPARISON:  None. FINDINGS: There is no evidence of fracture or other focal bone lesions. Surgical screw is seen in distal right fibula. Vascular calcifications are noted.  No lytic destruction is seen to suggest osteomyelitis. IMPRESSION: No fracture or dislocation is noted. No definite evidence of osteomyelitis. Electronically Signed   By: Marijo Conception, M.D.   On: 11/02/2017 17:30   US Abdomen Complete  Result Date: 11/03/2017 CLINICAL DATA:  Emesis for 3 days and intermittent fever. Chronic abdominal pain. EXAM: ABDOMEN ULTRASOUND COMPLETE COMPARISON:  Abdominal CT 11/02/2017 FINDINGS: Gallbladder: Small amount of echogenic sludge within the gallbladder. No gallbladder wall distention or wall thickening. Reportedly, the patient does not have a sonographic Murphy sign. No gallstones. Common bile duct: Diameter: 0.4 cm. Liver: No focal lesion identified. Within normal limits in parenchymal echogenicity. Portal vein is patent on color Doppler imaging with normal direction of blood flow towards the liver. IVC: No abnormality visualized. Pancreas: Visualized portion unremarkable. Spleen: Size and appearance within normal limits. Right Kidney: Length: 11.6 cm. Increased echogenicity in the right kidney without hydronephrosis. There is a indeterminate hypoechoic structure along the lateral upper pole region. This structure measures 2.6 x 2.4 x 2.8 cm. Structure may be partially cystic but cannot exclude a solid component. Review of the prior non contrast CT demonstrates minimal fullness in this area but there is not an obvious lesion based on the non contrast images. Left Kidney: Length: 10.1 cm. Echogenicity within normal limits. No mass or hydronephrosis visualized. Abdominal aorta: No aneurysm visualized. Other findings: None. IMPRESSION: Indeterminate right renal lesion measuring up to 2.8 cm. This could represent a complex cystic lesion but cannot exclude a solid component. This lesion is not well characterized on the prior non contrast CTs. Patient is not a candidate for contrast enhanced studies due to renal function but this lesion could be further characterized with a non  contrast renal MRI. Gallbladder sludge without stones or biliary dilatation. Electronically Signed   By: Markus Daft M.D.   On: 11/03/2017 11:50   Ir Fluoro Guide Cv Line Right  Result Date: 11/09/2017 INDICATION: Acute on chronic renal failure. Please place tunneled dialysis catheter for the initiation of dialysis. EXAM: TUNNELED CENTRAL VENOUS HEMODIALYSIS CATHETER PLACEMENT WITH ULTRASOUND AND FLUOROSCOPIC GUIDANCE MEDICATIONS: Ancef 2 gm IV . The antibiotic was given in an appropriate time interval prior to skin puncture. ANESTHESIA/SEDATION: Versed 1.5 mg IV; Fentanyl 50 mcg IV; Moderate Sedation Time: 13 minutes The patient was continuously monitored during the procedure by the interventional radiology nurse under my direct supervision. FLUOROSCOPY TIME:  Fluoroscopy Time: 30 seconds (3 mGy). COMPLICATIONS: None immediate. PROCEDURE: Informed written consent was obtained from the patient after a discussion of the risks, benefits, and alternatives to treatment. Questions regarding the procedure were encouraged and answered. The right neck and chest were prepped with chlorhexidine in a sterile fashion, and a sterile drape was applied covering the operative field. Maximum barrier sterile technique with sterile gowns and gloves were used for the procedure. A timeout was performed prior  to the initiation of the procedure. After creating a small venotomy incision, a micropuncture kit was utilized to access the internal jugular vein. Real-time ultrasound guidance was utilized for vascular access including the acquisition of a permanent ultrasound image documenting patency of the accessed vessel. The microwire was utilized to measure appropriate catheter length. A stiff Glidewire was advanced to the level of the IVC and the micropuncture sheath was exchanged for a peel-away sheath. A palindrome tunneled hemodialysis catheter measuring 19 cm from tip to cuff was tunneled in a retrograde fashion from the anterior  chest wall to the venotomy incision. The catheter was then placed through the peel-away sheath with tips ultimately positioned within the superior aspect of the right atrium. Final catheter positioning was confirmed and documented with a spot radiographic image. The catheter aspirates and flushes normally. The catheter was flushed with appropriate volume heparin dwells. The catheter exit site was secured with a 0-Prolene retention suture. The venotomy incision was closed with an interrupted 4-0 Vicryl, Dermabond and Steri-strips. Dressings were applied. The patient tolerated the procedure well without immediate post procedural complication. IMPRESSION: Successful placement of 19 cm tip to cuff tunneled hemodialysis catheter via the right internal jugular vein with tips terminating within the superior aspect of the right atrium. The catheter is ready for immediate use. Electronically Signed   By: Sandi Mariscal M.D.   On: 11/09/2017 14:01   Ir US Guide Vasc Access Right  Result Date: 11/09/2017 INDICATION: Acute on chronic renal failure. Please place tunneled dialysis catheter for the initiation of dialysis. EXAM: TUNNELED CENTRAL VENOUS HEMODIALYSIS CATHETER PLACEMENT WITH ULTRASOUND AND FLUOROSCOPIC GUIDANCE MEDICATIONS: Ancef 2 gm IV . The antibiotic was given in an appropriate time interval prior to skin puncture. ANESTHESIA/SEDATION: Versed 1.5 mg IV; Fentanyl 50 mcg IV; Moderate Sedation Time: 13 minutes The patient was continuously monitored during the procedure by the interventional radiology nurse under my direct supervision. FLUOROSCOPY TIME:  Fluoroscopy Time: 30 seconds (3 mGy). COMPLICATIONS: None immediate. PROCEDURE: Informed written consent was obtained from the patient after a discussion of the risks, benefits, and alternatives to treatment. Questions regarding the procedure were encouraged and answered. The right neck and chest were prepped with chlorhexidine in a sterile fashion, and a sterile  drape was applied covering the operative field. Maximum barrier sterile technique with sterile gowns and gloves were used for the procedure. A timeout was performed prior to the initiation of the procedure. After creating a small venotomy incision, a micropuncture kit was utilized to access the internal jugular vein. Real-time ultrasound guidance was utilized for vascular access including the acquisition of a permanent ultrasound image documenting patency of the accessed vessel. The microwire was utilized to measure appropriate catheter length. A stiff Glidewire was advanced to the level of the IVC and the micropuncture sheath was exchanged for a peel-away sheath. A palindrome tunneled hemodialysis catheter measuring 19 cm from tip to cuff was tunneled in a retrograde fashion from the anterior chest wall to the venotomy incision. The catheter was then placed through the peel-away sheath with tips ultimately positioned within the superior aspect of the right atrium. Final catheter positioning was confirmed and documented with a spot radiographic image. The catheter aspirates and flushes normally. The catheter was flushed with appropriate volume heparin dwells. The catheter exit site was secured with a 0-Prolene retention suture. The venotomy incision was closed with an interrupted 4-0 Vicryl, Dermabond and Steri-strips. Dressings were applied. The patient tolerated the procedure well without immediate post procedural complication.  IMPRESSION: Successful placement of 19 cm tip to cuff tunneled hemodialysis catheter via the right internal jugular vein with tips terminating within the superior aspect of the right atrium. The catheter is ready for immediate use. Electronically Signed   By: Sandi Mariscal M.D.   On: 11/09/2017 14:01   Dg Chest Port 1 View  Result Date: 11/03/2017 CLINICAL DATA:  Respiratory failure EXAM: PORTABLE CHEST 1 VIEW COMPARISON:  November 12, 2017 FINDINGS: There is stable cardiomegaly with mild  pulmonary venous hypertension. There is atelectatic change in the left base. No frank edema or consolidation evident. There is aortic atherosclerosis. No bone lesions appreciable. IMPRESSION: Evidence of a degree of pulmonary vascular congestion without frank edema or consolidation. There is left base atelectasis. There is aortic atherosclerosis. Aortic Atherosclerosis (ICD10-I70.0). Electronically Signed   By: Lowella Grip III M.D.   On: 11/03/2017 08:37   Dg Chest Port 1 View  Result Date: 11/02/2017 CLINICAL DATA:  Shortness of breath and cough. EXAM: PORTABLE CHEST 1 VIEW COMPARISON:  August 13, 2017 FINDINGS: Stable cardiomegaly. Mild edema. No other interval changes or acute abnormalities. IMPRESSION: Cardiomegaly and mild edema. Electronically Signed   By: Dorise Bullion III M.D   On: 11/02/2017 19:11    Assessment/Plan  Cellulitis of right lower extremity Will Start her on Doxycyline 100 mg BID for 7 Days.   Venous stasis ulcer of right calf  Dressing changed to Silver alginate D/W Wound care nurse Pateint follows with Vascular as outpatient    Anemia in Setting of ESRD and Occult positive in past Started on [facility] follow up Repeat CBC before Discharge   ESRD (end stage renal disease) on dialysis  Tolerating well Essential Hypertension BP running low since on Dialysis Decrease the dose of Norvasc to 5 mg She is not on Lasix anymore  Right Abdominal pain Patient had US done in the hospital which showed sludge in [place] bladder She has follow up with Gastro Repeat hepatic panel. Diabetes Type 2 BS are doing well on Levimir and Novolog Bolus  Atrial Fibrillation INR is subtherapeutic Are being careful wit her INR due to Hematoma in her Left Arm Will increase her Coumadin to 3.5 mg repeat INR in Few days Rate control on Metoprolol. Chronic diastolic heart failure  Patient is doing better since started on Dialysis. COPD On Chronic prednisone H/O SLE On  Plaquenil;l Disposition Discharge home with her Husband He is putting Stair lift for her to go to [place] Patient stays Dependent for ADL Family/ staff Communication:   Labs/tests ordered:   Total time spent in this patient care encounter was 45_ minutes; greater than 50% of the visit spent counseling patient, reviewing records , Labs and coordinating care for problems addressed at this encounter.

## 2017-12-02 ENCOUNTER — Other Ambulatory Visit (HOSPITAL_COMMUNITY): Payer: Medicare Other

## 2017-12-02 ENCOUNTER — Non-Acute Institutional Stay (SKILLED_NURSING_FACILITY): Payer: Medicare Other | Admitting: Internal Medicine

## 2017-12-02 ENCOUNTER — Ambulatory Visit (HOSPITAL_COMMUNITY): Payer: Medicare Other

## 2017-12-02 ENCOUNTER — Encounter: Payer: Self-pay | Admitting: Internal Medicine

## 2017-12-02 ENCOUNTER — Encounter (HOSPITAL_COMMUNITY)
Admission: RE | Admit: 2017-12-02 | Discharge: 2017-12-02 | Disposition: A | Discharge status: Home / Self Care | Payer: Medicare Other | Source: Skilled Nursing Facility | Attending: Internal Medicine | Admitting: Internal Medicine

## 2017-12-02 DIAGNOSIS — I5032 Chronic diastolic (congestive) heart failure: Secondary | ICD-10-CM

## 2017-12-02 DIAGNOSIS — J439 Emphysema, unspecified: Secondary | ICD-10-CM

## 2017-12-02 DIAGNOSIS — E1122 Type 2 diabetes mellitus with diabetic chronic kidney disease: Secondary | ICD-10-CM

## 2017-12-02 DIAGNOSIS — N184 Chronic kidney disease, stage 4 (severe): Secondary | ICD-10-CM | POA: Insufficient documentation

## 2017-12-02 DIAGNOSIS — R262 Difficulty in walking, not elsewhere classified: Secondary | ICD-10-CM | POA: Insufficient documentation

## 2017-12-02 DIAGNOSIS — I4892 Unspecified atrial flutter: Secondary | ICD-10-CM

## 2017-12-02 DIAGNOSIS — D508 Other iron deficiency anemias: Secondary | ICD-10-CM | POA: Diagnosis not present

## 2017-12-02 DIAGNOSIS — Z992 Dependence on renal dialysis: Secondary | ICD-10-CM

## 2017-12-02 DIAGNOSIS — Z794 Long term (current) use of insulin: Secondary | ICD-10-CM | POA: Diagnosis not present

## 2017-12-02 DIAGNOSIS — D509 Iron deficiency anemia, unspecified: Secondary | ICD-10-CM | POA: Insufficient documentation

## 2017-12-02 DIAGNOSIS — N186 End stage renal disease: Secondary | ICD-10-CM

## 2017-12-02 DIAGNOSIS — Z978 Presence of other specified devices: Secondary | ICD-10-CM | POA: Insufficient documentation

## 2017-12-02 LAB — CBC
HEMATOCRIT: 27.2 % — AB (ref 36.0–46.0)
HEMOGLOBIN: 8.1 g/dL — AB (ref 12.0–15.0)
MCH: 28.7 pg (ref 26.0–34.0)
MCHC: 29.8 g/dL — AB (ref 30.0–36.0)
MCV: 96.5 fL (ref 78.0–100.0)
Platelets: 312 10*3/uL (ref 150–400)
RBC: 2.82 MIL/uL — ABNORMAL LOW (ref 3.87–5.11)
RDW: 19.2 % — AB (ref 11.5–15.5)
WBC: 6.4 10*3/uL (ref 4.0–10.5)

## 2017-12-02 LAB — PROTIME-INR
INR: 1.09
PROTHROMBIN TIME: 14 s (ref 11.4–15.2)

## 2017-12-02 LAB — HEPATIC FUNCTION PANEL
ALT: 16 U/L (ref 14–54)
AST: 20 U/L (ref 15–41)
Albumin: 2.3 g/dL — ABNORMAL LOW (ref 3.5–5.0)
Alkaline Phosphatase: 77 U/L (ref 38–126)
TOTAL PROTEIN: 6.2 g/dL — AB (ref 6.5–8.1)
Total Bilirubin: 0.5 mg/dL (ref 0.3–1.2)

## 2017-12-02 NOTE — Progress Notes (Signed)
Location:   [facility] Room Number: 588/F Place of Service:  SNF [phone])  Provider: Granville Lewis  PCP: Sharilyn Sites, MD Patient Care Team: Sharilyn Sites, MD as PCP - General (Family Medicine) Satira Sark, MD as Consulting Physician (Cardiology) Donato Heinz, MD as Consulting Physician (Nephrology) Corliss Parish, MD as Consulting Physician (Nephrology) Early, Arvilla Meres, MD as Consulting Physician (Vascular Surgery) Farrel Gobble, MD (Inactive) as Consulting Physician (Hematology and Oncology) [name], MD as Consulting Physician (Gastroenterology)  Extended Emergency Contact Information Primary Emergency Contact: [facility] Address: [address] [place]          [place], [place] [address] [place] Phone: [phone] Mobile Phone: [phone] Relation: Spouse  Code Status: Full Code Goals of care:  Advanced Directive information Advanced Directives 12/02/2017  Does Patient Have a Medical Advance Directive? Yes  Type of Advance Directive (No Data)  Does patient want to make changes to medical advance directive? No - Patient declined  Copy of [place] in Chart? No - copy requested  Would patient like information on creating a medical advance directive? No - Patient declined  Pre-existing out of facility DNR order (yellow form or pink MOST form) -     Allergies  Allergen Reactions  . Ace Inhibitors Cough    Chief Complaint  Patient presents with  . Discharge Note    Discharge Visit    HPI:  72 y.o. female seen today for possible discharge from facility later this week  She has a complex medical history including end-stage renal disease on chronic hemodialysis and has been here for rehab and strengthening.  She was most recently hospitalized for pneumonia and COPD-she also has a history of diabetes hyperlipidemia chronic diastolic CHF atrial fibrillation on chronic Coumadin systemic  lupus and hypertension as well as anemia with occult positive stool.  She also has a left axillary hematoma which appears to be resolving.  Regards to the COPD-pneumonia she was treated with IV antibiotics in the hospital-she also got IV Lasix for concerns of volume overload and was started at dialysis at that time.  -Her stay here has been complicated with development of a left arm hematoma- and her Coumadin was held for short.-  She was also found in the ER to have an occult positive stool-and she will need GI follow-up-hemoglobin on lab today actually has shown improvement at 8.1.--She has been started on iron She was also seen by Dr. Lyndel Safe yesterday for concerns of right lower extremity cellulitis She does have a history of a venous stasis ulcer on the right lower leg-Apparently some increased erythema around the site was noted yesterday-she has been started on doxycycline.  She also has a history of chronic right-sided abdominal pain she had an ultrasound in the hospital which showed some sludge in the gallbladder she does have follow-up with gastroenterology-the pain has not really gotten any worse during her stay here-liver function tests today are largely within normal limits except for an albumin of 2.3 which actually slightly improved from previous lab   In regards to atrial fibrillation she is back on Coumadin INR has been subtherapeutic given Coumadin as of today has been increased up to 4 mg--INR was 1.09 today-this will need updating before discharge.  She also is a type II diabetic she is on Levemir as well as NovoLog sliding scale- Levemir was recently decreased because of concerns of some borderline low blood sugars in the morning-recent a.m. blood sugars  have been averaging in the mid 100s-noontime readings also appear to be similar is- at 5 PM some more variability ranging from recently 116 up to 282 but not persistently above 200-at bedtime readings show variability ranging from  102 up to 344 over the last several days.  On discharge she will be going home with her husband--who is actually putting a stair lift for her to go to the bathroom- she continues to have dependency for her ADLs   .            Past Medical History:  Diagnosis Date  . Anemia   . Arthritis   . Asthma   . Atrial fibrillation ([place])   . Atrial flutter [facility])    TEE/DCCV December 2013 - on Xarelto [facility])  . Bone spur of toe    Right 5th toe  . Chronic diastolic CHF (congestive heart failure) (HCC)    a. EF 50-55% by echo in 2015  . CKD (chronic kidney disease) stage 4, GFR 15-29 ml/min (HCC)    a. s/p fistula placement but not on HD. Followed by Nephrology.   Marland Kitchen COPD (chronic obstructive pulmonary disease) ([place])   . DVT of upper extremity (deep vein thrombosis) ([place])    Right basilic vein, June 1610  . Essential hypertension, benign   . Fractures   . SLE (systemic lupus erythematosus) ([place])   . Type 2 diabetes mellitus ([place])   . UTI (lower urinary tract infection)     Past Surgical History:  Procedure Laterality Date  . ABDOMINAL HYSTERECTOMY  1983  . ANKLE SURGERY  1993  . AV FISTULA PLACEMENT Left 03/27/2014   Procedure: ARTERIOVENOUS (AV) FISTULA CREATION;  Surgeon: Rosetta Posner, MD;  Location: [place];  Service: Vascular;  Laterality: Left;  . BACK SURGERY  1980  . CARDIOVASCULAR STRESS TEST  12/19/2009   no stress induced rhythm abnormalities, ekg negative for ischemia  . CARDIOVERSION  09/16/2012   Procedure: CARDIOVERSION;  Surgeon: Sanda Klein, MD;  Location: [facility] ENDOSCOPY;  Service: Cardiovascular;  Laterality: N/A;  . COLONOSCOPY  09/20/2012   Dr. [name]: multiple tubular adenomas   . COLONOSCOPY  2005   Dr. [name]. Polyps, path unknown   . COLONOSCOPY N/A 07/24/2016   Procedure: COLONOSCOPY;  Surgeon: [name], MD;  [facility];  Service: Endoscopy;  Laterality: N/A;  9:00 am  . ESOPHAGOGASTRODUODENOSCOPY  09/20/2012   Dr. [name]: duodenal  erosion and possible resolving ulcer at the angularis   . IR FLUORO GUIDE CV LINE RIGHT  11/09/2017  . IR US GUIDE VASC ACCESS RIGHT  11/09/2017  . TEE WITHOUT CARDIOVERSION  09/16/2012   Procedure: TRANSESOPHAGEAL ECHOCARDIOGRAM (TEE);  Surgeon: Sanda Klein, MD;  Location: [facility] ENDOSCOPY;  Service: Cardiovascular;  Laterality: N/A;  . TRANSESOPHAGEAL ECHOCARDIOGRAM WITH CARDIOVERSION  09/16/2012   EF 60-65%, moderate LVH, moderate regurg of the aortic valve, LA moderately dilated      reports that she quit smoking about 5 years ago. Her smoking use included cigarettes. She has a 30.00 pack-year smoking history. she has never used smokeless tobacco. She reports that she does not drink alcohol or use drugs. Social History   Socioeconomic History  . Marital status: Married    Spouse name: Not on file  . Number of children: Not on file  . Years of education: Not on file  . Highest education level: Not on file  Social Needs  . Financial resource strain: Not on file  . Food insecurity -  worry: Not on file  . Food insecurity - inability: Not on file  . Transportation needs - medical: Not on file  . Transportation needs - non-medical: Not on file  Occupational History  . Not on file  Tobacco Use  . Smoking status: Former Smoker    Packs/day: 1.00    Years: 30.00    Pack years: 30.00    Types: Cigarettes    Last attempt to quit: 08/29/2012    Years since quitting: 5.2  . Smokeless tobacco: Never Used  . Tobacco comment: quit 08-2012  Substance and Sexual Activity  . Alcohol use: No    Alcohol/week: 0.0 oz  . Drug use: No  . Sexual activity: Yes    Partners: Male  Other Topics Concern  . Not on file  Social History Narrative  . Not on file   Functional Status Survey:    Allergies  Allergen Reactions  . Ace Inhibitors Cough    Pertinent  Health Maintenance Due  Topic Date Due  . FOOT EXAM  12/14/2017 (Originally 11/27/1955)  . OPHTHALMOLOGY EXAM  12/14/2017 (Originally  03/28/2016)  . URINE MICROALBUMIN  12/14/2017 (Originally 11/27/1955)  . DEXA SCAN  12/14/2017 (Originally 11/26/2010)  . INFLUENZA VACCINE  07/08/2018 (Originally 04/29/2017)  . HEMOGLOBIN A1C  04/19/2018  . PNA vac Low Risk Adult (2 of 2 - PCV13) 11/04/2018  . MAMMOGRAM  09/29/2019  . COLONOSCOPY  07/24/2026    Medications: Outpatient Encounter Medications as of 12/02/2017  Medication Sig  . acetaminophen (TYLENOL) 500 MG tablet Take 1,000 mg by mouth every 6 (six) hours as needed. For pain  . albuterol (ACCUNEB) 0.63 MG/3ML nebulizer solution Take 1 ampule by nebulization every 6 (six) hours as needed for wheezing or shortness of breath.   Marland Kitchen albuterol (PROVENTIL HFA;VENTOLIN HFA) 108 (90 BASE) MCG/ACT inhaler Inhale 2 puffs into the lungs every 6 (six) hours as needed. For shortness of breath  . amLODipine (NORVASC) 5 MG tablet Take 5 mg by mouth daily.  Marland Kitchen atorvastatin (LIPITOR) 40 MG tablet Take 40 mg at bedtime by mouth.   . brimonidine-timolol (COMBIGAN) 0.2-0.5 % ophthalmic solution Place 1 drop into the left eye daily.   . calcium acetate (PHOSLO) 667 MG capsule Take 2 capsules (1,334 mg total) by mouth 3 (three) times daily with meals.  . collagenase (SANTYL) ointment Apply 1 application topically daily.  . diclofenac (VOLTAREN) 0.1 % ophthalmic solution Place 1 drop into the left eye daily. Uses at lunch time  . dicyclomine (BENTYL) 10 MG capsule Take 10 mg by mouth 4 (four) times daily -  before meals and at bedtime.  Marland Kitchen doxycycline (VIBRAMYCIN) 100 MG capsule Take 100 mg by mouth 2 (two) times daily.  . febuxostat (ULORIC) 40 MG tablet Take 40 mg by mouth daily.  . folic acid (FOLVITE) 1 MG tablet Take 1 mg by mouth daily.  Marland Kitchen HYDROcodone-acetaminophen (NORCO/VICODIN) 5-325 MG tablet Take 1-2 tablets by mouth every 8 (eight) hours as needed for moderate pain.  . hydroxychloroquine (PLAQUENIL) 200 MG tablet Take 300 mg by mouth as directed. Take 1 1/2 tablets daily  . Insulin Detemir  (LEVEMIR FLEXTOUCH) 100 UNIT/ML Pen Inject 7 Units into the skin daily at 10 pm.  . insulin lispro (HUMALOG KWIKPEN) 100 UNIT/ML KiwkPen Give per sliding scale from 2-10 units subcutaneous three times a day.  . iron polysaccharides (NIFEREX) 150 MG capsule Take 150 mg by mouth daily.  . metoprolol tartrate (LOPRESSOR) 50 MG tablet Take 50 mg by  mouth at bedtime.  Marland Kitchen METOPROLOL TARTRATE PO Take 1 tablet by mouth except dialysis days which are Tuesday,Thursday, and Saturday mornings  . Multiple Vitamins-Minerals (CENTRUM SILVER PO) Take 1 tablet by mouth daily.  Marland Kitchen omeprazole (PRILOSEC) 20 MG capsule Take 20 mg by mouth daily.  . ONE TOUCH ULTRA TEST test strip TEST 4 TIMES A DAY AS DIRECTED.  Marland Kitchen ONETOUCH DELICA LANCETS 33I MISC USE TO TEST 4 TIMES DAILY.  Marland Kitchen polyethylene glycol (MIRALAX / GLYCOLAX) packet Take 17 g by mouth daily as needed.  . predniSONE (DELTASONE) 5 MG tablet Take 5 mg by mouth daily with breakfast.  . travoprost, benzalkonium, (TRAVATAN) 0.004 % ophthalmic solution Place 1 drop into the left eye at bedtime.  Marland Kitchen UNIFINE PENTIPS 31G X 6 MM MISC USE AS DIRECTED AT BEDTIME WITH LEVEMIR AND WITH SLIDING SCALE INSULIN.  Marland Kitchen Vitamin D, Ergocalciferol, (DRISDOL) 50000 units CAPS capsule TAKE 1 CAPSULE BY MOUTH ONCE WEEKLY.  . warfarin (COUMADIN) 1 MG tablet Give 0.5 mg by mouth along with 3 mg to = 3.5 mg once a day  . warfarin (COUMADIN) 3 MG tablet Give 3 mg by mouth along with 0.5 mg to = 3.5 mg once a day  . [DISCONTINUED] amLODipine (NORVASC) 10 MG tablet Take 1 tablet (10 mg total) by mouth at bedtime. (Patient taking differently: Take 5 mg by mouth at bedtime. )  . [DISCONTINUED] warfarin (COUMADIN) 3 MG tablet Take 3 mg by mouth daily.   No facility-administered encounter medications on file as of 12/02/2017.      Review of Systems  In general she is not complaining of fever chills says she feels like she is doing relatively well.  Skin does not complain of rashes or itching again  she does have the venous stasis ulcer right lower leg on antibiotic for concerns of cellulitis.  Head ears eyes nose mouth and throat does not complain of visual changes or sore throat or nasal discharge.  Respiratory is not complaining shortness of breath or cough.  Cardiac does not complain of chest pain does have some mild lower extremity edema.  GI does complain at times of right-sided abdominal discomfort-also said she had an episode last night of apparently some "up chucking"-this was a one-time episode and has not recurred she ate almost all her lunch today  GU is not complaining of dysuria does have end-stage renal disease on dialysis.  Musculoskeletal does have lower extremity weakness still needs help with her ADLs but does not complain of musculoskeletal joint pain at this time.  Neurologic does not complain of dizziness or headache does have weakness does not complain of syncope.  Psych does not complain of overt anxiety or depression appears to be in good spirits     Vitals:   12/02/17 1002  BP: 123/63  Pulse: 70  Resp: 18  Temp: (!) 97.3 F (36.3 C)  TempSrc: Oral  Weight is 178 pounds   Physical Exam    in general this is a pleasant elderly female in no distress sitting comfortably in a wheelchair.  Her skin is warm and dry there is covering over her right lower leg venous stasis ulcer I do not see any surrounding erythema from the bandaging per nursing this is stable.antibiotic started yesterday marks so we will just  Hematoma left axilla area appears to be largely resolving she still has a small area of firmness in the axilla area  Eyes pupils appear reactive to light sclera and conjunctive are clear  visual acuity remains intact.  Oropharynx is clear mucous membranes moist.  Chest is clear to auscultation there is no labored breathing.  Heart is irregular irregular rate and rhythm with mild lower extremity edema.  Abdomen is soft a small amount of  tenderness on the right side which is not new bowel sounds are active.  Musculoskeletal is able to move all extremities x4 with some continued lower extremity weakness--.  Neurologic is grossly intact she is alert oriented cranial nerves appear grossly intact could not really appreciate any lateralizing findings.  Psych she is alert and oriented pleasant and appropriate  Labs reviewed: Basic Metabolic Panel: Recent Labs    11/06/17 0547  11/09/17 0459 11/10/17 0507 11/11/17 0415  11/19/17 0713 11/26/17 0704 11/30/17 0730  NA 134*   < > 138 141 136   < > 136 136 136  K 4.1   < > 4.0 3.9 3.8   < > 3.8 4.2 4.0  CL 99*   < > 100* 100* 96*   < > 98* 100* 98*  CO2 20*   < > _0 < > _1 GLUCOSE 155*   < > 442* 185* 334*   < > 89 59* 186*  BUN 128*   < > 152* 97* 69*   < > 29* 28* 37*  CREATININE 4.56*   < > 4.08* 2.87* 2.85*   < > 3.93* 3.37* 3.56*  CALCIUM 8.6*   < > 8.7* 8.5* 8.3*   < > 8.4* 8.6* 8.8*  MG 2.4  --   --   --   --   --   --   --   --   PHOS 7.0*   < > 5.5* 4.4 4.2  --   --   --   --    < > = values in this interval not displayed.   Liver Function Tests: Recent Labs    10/21/17 1027 11/02/17 1536  11/10/17 0507 11/11/17 0415 12/02/17 0630  AST 28 37  --   --   --  20  ALT 25 27  --   --   --  16  ALKPHOS 56 108  --   --   --  77  BILITOT 0.3 0.6  --   --   --  0.5  PROT 7.1 7.4  --   --   --  6.2*  ALBUMIN 2.8* 2.3*   < > 2.3* 2.1* 2.3*   < > = values in this interval not displayed.   Recent Labs    08/13/17 1021 11/02/17 1536  LIPASE 24 20   No results for input(s): AMMONIA in the last 8760 hours. CBC: Recent Labs    11/17/17 0706  11/21/17 0830 11/26/17 0704 11/30/17 0730 12/02/17 0630  WBC 11.5*   < > 7.2 6.2 6.1 6.4  NEUTROABS 9.7*  --  5.7  --  4.5  --   HGB 9.5*   < > 8.3* 8.0* 7.4* 8.1*  HCT 30.8*   < > 27.7* 27.5* 26.0* 27.2*  MCV 90.3   < > 93.0 96.2 97.4 96.5  PLT 222   < > 164 200 324 312   < > = values in this  interval not displayed.   Cardiac Enzymes: Recent Labs    11/03/17 0208 11/03/17 0801 11/03/17 1432  TROPONINI 0.09* 0.09* 0.08*   BNP: Invalid input(s): POCBNP CBG: Recent Labs    11/13/17 0729 11/13/17 1132  11/13/17 1627  GLUCAP 154* 168* 131*    Procedures and Imaging Studies During Stay: Ct Abdomen Pelvis Wo Contrast  Result Date: 11/02/2017 CLINICAL DATA:  Emesis for the last 3 days.  Intermittent fever. EXAM: CT ABDOMEN AND PELVIS WITHOUT CONTRAST TECHNIQUE: Multidetector CT imaging of the abdomen and pelvis was performed following the standard protocol without IV contrast. COMPARISON:  09/09/2017.  08/31/2017. FINDINGS: Lower chest: There is a small left effusion. There is segmental consolidation in the left lower lobe suggesting pneumonia. The heart is mildly enlarged. Hepatobiliary: No liver parenchymal abnormality is seen. There is some layering sludge or stones in the gallbladder. No CT evidence of cholecystitis. Pancreas: Normal Spleen: Normal Adrenals/Urinary Tract: Adrenal glands are normal. Kidneys show vascular calcifications but no acute finding. Bladder appears normal. Stomach/Bowel: No acute bowel pathology is seen. No sign of obstruction or inflammatory disease. Vascular/Lymphatic: Advanced aortic atherosclerosis. Atherosclerosis of the aortic branch vessels. IVC is normal. No retroperitoneal adenopathy. Reproductive: Previous hysterectomy.  No pelvic mass. Other: No free fluid or air. Musculoskeletal: Chronic lumbar degenerative changes. No acute bone finding. IMPRESSION: Segmental consolidation in the left lower lobe consistent with bronchopneumonia. Small amount of left pleural fluid. Sludge or gallstones in the gallbladder. Aortic atherosclerosis. No acute bowel pathology seen. No obstruction or inflammatory finding. Electronically Signed   By: Nelson Chimes M.D.   On: 11/02/2017 18:28   Dg Tibia/fibula Right  Result Date: 11/02/2017 CLINICAL DATA:  Right lower leg  ulcers. EXAM: RIGHT TIBIA AND FIBULA - 2 VIEW COMPARISON:  None. FINDINGS: There is no evidence of fracture or other focal bone lesions. Surgical screw is seen in distal right fibula. Vascular calcifications are noted. No lytic destruction is seen to suggest osteomyelitis. IMPRESSION: No fracture or dislocation is noted. No definite evidence of osteomyelitis. Electronically Signed   By: Marijo Conception, M.D.   On: 11/02/2017 17:30   US Abdomen Complete  Result Date: 11/03/2017 CLINICAL DATA:  Emesis for 3 days and intermittent fever. Chronic abdominal pain. EXAM: ABDOMEN ULTRASOUND COMPLETE COMPARISON:  Abdominal CT 11/02/2017 FINDINGS: Gallbladder: Small amount of echogenic sludge within the gallbladder. No gallbladder wall distention or wall thickening. Reportedly, the patient does not have a sonographic Murphy sign. No gallstones. Common bile duct: Diameter: 0.4 cm. Liver: No focal lesion identified. Within normal limits in parenchymal echogenicity. Portal vein is patent on color Doppler imaging with normal direction of blood flow towards the liver. IVC: No abnormality visualized. Pancreas: Visualized portion unremarkable. Spleen: Size and appearance within normal limits. Right Kidney: Length: 11.6 cm. Increased echogenicity in the right kidney without hydronephrosis. There is a indeterminate hypoechoic structure along the lateral upper pole region. This structure measures 2.6 x 2.4 x 2.8 cm. Structure may be partially cystic but cannot exclude a solid component. Review of the prior non contrast CT demonstrates minimal fullness in this area but there is not an obvious lesion based on the non contrast images. Left Kidney: Length: 10.1 cm. Echogenicity within normal limits. No mass or hydronephrosis visualized. Abdominal aorta: No aneurysm visualized. Other findings: None. IMPRESSION: Indeterminate right renal lesion measuring up to 2.8 cm. This could represent a complex cystic lesion but cannot exclude a solid  component. This lesion is not well characterized on the prior non contrast CTs. Patient is not a candidate for contrast enhanced studies due to renal function but this lesion could be further characterized with a non contrast renal MRI. Gallbladder sludge without stones or biliary dilatation. Electronically Signed   By: Quita Skye  Anselm Pancoast M.D.   On: 11/03/2017 11:50   Ir Fluoro Guide Cv Line Right  Result Date: 11/09/2017 INDICATION: Acute on chronic renal failure. Please place tunneled dialysis catheter for the initiation of dialysis. EXAM: TUNNELED CENTRAL VENOUS HEMODIALYSIS CATHETER PLACEMENT WITH ULTRASOUND AND FLUOROSCOPIC GUIDANCE MEDICATIONS: Ancef 2 gm IV . The antibiotic was given in an appropriate time interval prior to skin puncture. ANESTHESIA/SEDATION: Versed 1.5 mg IV; Fentanyl 50 mcg IV; Moderate Sedation Time: 13 minutes The patient was continuously monitored during the procedure by the interventional radiology nurse under my direct supervision. FLUOROSCOPY TIME:  Fluoroscopy Time: 30 seconds (3 mGy). COMPLICATIONS: None immediate. PROCEDURE: Informed written consent was obtained from the patient after a discussion of the risks, benefits, and alternatives to treatment. Questions regarding the procedure were encouraged and answered. The right neck and chest were prepped with chlorhexidine in a sterile fashion, and a sterile drape was applied covering the operative field. Maximum barrier sterile technique with sterile gowns and gloves were used for the procedure. A timeout was performed prior to the initiation of the procedure. After creating a small venotomy incision, a micropuncture kit was utilized to access the internal jugular vein. Real-time ultrasound guidance was utilized for vascular access including the acquisition of a permanent ultrasound image documenting patency of the accessed vessel. The microwire was utilized to measure appropriate catheter length. A stiff Glidewire was advanced to the  level of the IVC and the micropuncture sheath was exchanged for a peel-away sheath. A palindrome tunneled hemodialysis catheter measuring 19 cm from tip to cuff was tunneled in a retrograde fashion from the anterior chest wall to the venotomy incision. The catheter was then placed through the peel-away sheath with tips ultimately positioned within the superior aspect of the right atrium. Final catheter positioning was confirmed and documented with a spot radiographic image. The catheter aspirates and flushes normally. The catheter was flushed with appropriate volume heparin dwells. The catheter exit site was secured with a 0-Prolene retention suture. The venotomy incision was closed with an interrupted 4-0 Vicryl, Dermabond and Steri-strips. Dressings were applied. The patient tolerated the procedure well without immediate post procedural complication. IMPRESSION: Successful placement of 19 cm tip to cuff tunneled hemodialysis catheter via the right internal jugular vein with tips terminating within the superior aspect of the right atrium. The catheter is ready for immediate use. Electronically Signed   By: Sandi Mariscal M.D.   On: 11/09/2017 14:01   Ir US Guide Vasc Access Right  Result Date: 11/09/2017 INDICATION: Acute on chronic renal failure. Please place tunneled dialysis catheter for the initiation of dialysis. EXAM: TUNNELED CENTRAL VENOUS HEMODIALYSIS CATHETER PLACEMENT WITH ULTRASOUND AND FLUOROSCOPIC GUIDANCE MEDICATIONS: Ancef 2 gm IV . The antibiotic was given in an appropriate time interval prior to skin puncture. ANESTHESIA/SEDATION: Versed 1.5 mg IV; Fentanyl 50 mcg IV; Moderate Sedation Time: 13 minutes The patient was continuously monitored during the procedure by the interventional radiology nurse under my direct supervision. FLUOROSCOPY TIME:  Fluoroscopy Time: 30 seconds (3 mGy). COMPLICATIONS: None immediate. PROCEDURE: Informed written consent was obtained from the patient after a discussion  of the risks, benefits, and alternatives to treatment. Questions regarding the procedure were encouraged and answered. The right neck and chest were prepped with chlorhexidine in a sterile fashion, and a sterile drape was applied covering the operative field. Maximum barrier sterile technique with sterile gowns and gloves were used for the procedure. A timeout was performed prior to the initiation of the procedure. After creating a  small venotomy incision, a micropuncture kit was utilized to access the internal jugular vein. Real-time ultrasound guidance was utilized for vascular access including the acquisition of a permanent ultrasound image documenting patency of the accessed vessel. The microwire was utilized to measure appropriate catheter length. A stiff Glidewire was advanced to the level of the IVC and the micropuncture sheath was exchanged for a peel-away sheath. A palindrome tunneled hemodialysis catheter measuring 19 cm from tip to cuff was tunneled in a retrograde fashion from the anterior chest wall to the venotomy incision. The catheter was then placed through the peel-away sheath with tips ultimately positioned within the superior aspect of the right atrium. Final catheter positioning was confirmed and documented with a spot radiographic image. The catheter aspirates and flushes normally. The catheter was flushed with appropriate volume heparin dwells. The catheter exit site was secured with a 0-Prolene retention suture. The venotomy incision was closed with an interrupted 4-0 Vicryl, Dermabond and Steri-strips. Dressings were applied. The patient tolerated the procedure well without immediate post procedural complication. IMPRESSION: Successful placement of 19 cm tip to cuff tunneled hemodialysis catheter via the right internal jugular vein with tips terminating within the superior aspect of the right atrium. The catheter is ready for immediate use. Electronically Signed   By: Sandi Mariscal M.D.   On:  11/09/2017 14:01   Dg Chest Port 1 View  Result Date: 11/03/2017 CLINICAL DATA:  Respiratory failure EXAM: PORTABLE CHEST 1 VIEW COMPARISON:  November 12, 2017 FINDINGS: There is stable cardiomegaly with mild pulmonary venous hypertension. There is atelectatic change in the left base. No frank edema or consolidation evident. There is aortic atherosclerosis. No bone lesions appreciable. IMPRESSION: Evidence of a degree of pulmonary vascular congestion without frank edema or consolidation. There is left base atelectasis. There is aortic atherosclerosis. Aortic Atherosclerosis (ICD10-I70.0). Electronically Signed   By: Lowella Grip III M.D.   On: 11/03/2017 08:37   Dg Chest Port 1 View  Result Date: 11/02/2017 CLINICAL DATA:  Shortness of breath and cough. EXAM: PORTABLE CHEST 1 VIEW COMPARISON:  August 13, 2017 FINDINGS: Stable cardiomegaly. Mild edema. No other interval changes or acute abnormalities. IMPRESSION: Cardiomegaly and mild edema. Electronically Signed   By: Dorise Bullion III M.D   On: 11/02/2017 19:11    Assessment/Plan:   #1 history of anemia with end-stage renal disease and occult positive stool-she is on iron hemoglobin has shown some improvement today at 8.1 she will have GI follow-up clinically appears to be stable. She is on a proton pump inhibitor as well  2.  History of atrial fibrillation this appears rate controlled on metoprolol-INR is 1.09 today Dr. Lyndel Safe has already increased her Coumadin up to 4 mg this will need updating before discharge.  3.  History of end-stage renal disease again she does receive dialysis 3 days a week and appears to be tolerating this well.  4.  History of hypertension blood pressures were running low over since she was on dialysis Norvasc has been decreased to 5 mg a day-this appears stable today with a blood pressure of 123/63 at this point will monitor she does not appear to be symptomatic of hypotension.  5.  History of COPD  pneumonia-she appears to have made a decent recovery from this she continues on as needed nebulizers-as well as Proventil inhaler as needed.  She is also on low-dose prednisone  6.  History of chronic diastolic CHF this appears to be stable and improved since she has been on  dialysis.    #7 history of systemic lupus she is on Plaquenil.  8.  History of right-sided abdominal pain chronic ultrasound again shows sludge in the gallbladder she has follow-up with gastroenterology had a panel today appear to be essentially unremarkable.  9.  History of right lower extremity cellulitis she is on a 10-day course of doxycycline this will need monitoring before discharge and also as an outpatient-.  Santyl also is being applied to the Venous stasis ulcer  #10 history of gout-she is on Uloric    this has not been an issue during her stay here  #11 history of type 2 diabetes-there is some variability in blood sugars but I do not see consistent elevations or lows which is encouraging- her husband is comfortable with sliding scale she has been on this before at home- she is also on Levemir--she is followed by endocrinologist Dr. Dorris Fetch   #12 generalized pain- does have an order for Norco 5 325 mg-1 or 2 tabs every 6 hours as needed for pain this appears to be helping   Again she will be going home with her husband who is very supportive she will need continued PT and OT as well as home health support for her numerous medical issues.  [id number]- of note greater than 30 minutes spent on this discharge summary- greater than 50% of time spent coordinating a plan of care for numerous diagnoses .

## 2017-12-03 DIAGNOSIS — D631 Anemia in chronic kidney disease: Secondary | ICD-10-CM | POA: Diagnosis not present

## 2017-12-03 DIAGNOSIS — D689 Coagulation defect, unspecified: Secondary | ICD-10-CM | POA: Diagnosis not present

## 2017-12-03 DIAGNOSIS — E876 Hypokalemia: Secondary | ICD-10-CM | POA: Diagnosis not present

## 2017-12-03 DIAGNOSIS — N186 End stage renal disease: Secondary | ICD-10-CM | POA: Diagnosis not present

## 2017-12-03 DIAGNOSIS — D509 Iron deficiency anemia, unspecified: Secondary | ICD-10-CM | POA: Diagnosis not present

## 2017-12-03 DIAGNOSIS — N2581 Secondary hyperparathyroidism of renal origin: Secondary | ICD-10-CM | POA: Diagnosis not present

## 2017-12-05 DIAGNOSIS — D689 Coagulation defect, unspecified: Secondary | ICD-10-CM | POA: Diagnosis not present

## 2017-12-05 DIAGNOSIS — E876 Hypokalemia: Secondary | ICD-10-CM | POA: Diagnosis not present

## 2017-12-05 DIAGNOSIS — N186 End stage renal disease: Secondary | ICD-10-CM | POA: Diagnosis not present

## 2017-12-05 DIAGNOSIS — D509 Iron deficiency anemia, unspecified: Secondary | ICD-10-CM | POA: Diagnosis not present

## 2017-12-05 DIAGNOSIS — D631 Anemia in chronic kidney disease: Secondary | ICD-10-CM | POA: Diagnosis not present

## 2017-12-05 DIAGNOSIS — N2581 Secondary hyperparathyroidism of renal origin: Secondary | ICD-10-CM | POA: Diagnosis not present

## 2017-12-06 DIAGNOSIS — I4891 Unspecified atrial fibrillation: Secondary | ICD-10-CM | POA: Diagnosis not present

## 2017-12-06 DIAGNOSIS — E1151 Type 2 diabetes mellitus with diabetic peripheral angiopathy without gangrene: Secondary | ICD-10-CM | POA: Diagnosis not present

## 2017-12-06 DIAGNOSIS — Z7901 Long term (current) use of anticoagulants: Secondary | ICD-10-CM | POA: Diagnosis not present

## 2017-12-06 DIAGNOSIS — E1122 Type 2 diabetes mellitus with diabetic chronic kidney disease: Secondary | ICD-10-CM | POA: Diagnosis not present

## 2017-12-06 DIAGNOSIS — M71551 Other bursitis, not elsewhere classified, right hip: Secondary | ICD-10-CM | POA: Diagnosis not present

## 2017-12-06 DIAGNOSIS — N184 Chronic kidney disease, stage 4 (severe): Secondary | ICD-10-CM | POA: Diagnosis not present

## 2017-12-06 DIAGNOSIS — Z794 Long term (current) use of insulin: Secondary | ICD-10-CM | POA: Diagnosis not present

## 2017-12-06 DIAGNOSIS — L97929 Non-pressure chronic ulcer of unspecified part of left lower leg with unspecified severity: Secondary | ICD-10-CM | POA: Diagnosis not present

## 2017-12-06 DIAGNOSIS — Z7982 Long term (current) use of aspirin: Secondary | ICD-10-CM | POA: Diagnosis not present

## 2017-12-06 DIAGNOSIS — L97919 Non-pressure chronic ulcer of unspecified part of right lower leg with unspecified severity: Secondary | ICD-10-CM | POA: Diagnosis not present

## 2017-12-06 DIAGNOSIS — Z7951 Long term (current) use of inhaled steroids: Secondary | ICD-10-CM | POA: Diagnosis not present

## 2017-12-06 DIAGNOSIS — E1140 Type 2 diabetes mellitus with diabetic neuropathy, unspecified: Secondary | ICD-10-CM | POA: Diagnosis not present

## 2017-12-06 DIAGNOSIS — Z4800 Encounter for change or removal of nonsurgical wound dressing: Secondary | ICD-10-CM | POA: Diagnosis not present

## 2017-12-06 DIAGNOSIS — Z79891 Long term (current) use of opiate analgesic: Secondary | ICD-10-CM | POA: Diagnosis not present

## 2017-12-07 ENCOUNTER — Telehealth: Payer: Self-pay | Admitting: Cardiology

## 2017-12-07 DIAGNOSIS — N183 Chronic kidney disease, stage 3 (moderate): Secondary | ICD-10-CM | POA: Diagnosis not present

## 2017-12-07 DIAGNOSIS — E1140 Type 2 diabetes mellitus with diabetic neuropathy, unspecified: Secondary | ICD-10-CM | POA: Diagnosis not present

## 2017-12-07 DIAGNOSIS — Z79891 Long term (current) use of opiate analgesic: Secondary | ICD-10-CM | POA: Diagnosis not present

## 2017-12-07 DIAGNOSIS — J449 Chronic obstructive pulmonary disease, unspecified: Secondary | ICD-10-CM | POA: Diagnosis not present

## 2017-12-07 DIAGNOSIS — E1151 Type 2 diabetes mellitus with diabetic peripheral angiopathy without gangrene: Secondary | ICD-10-CM | POA: Diagnosis not present

## 2017-12-07 DIAGNOSIS — L97919 Non-pressure chronic ulcer of unspecified part of right lower leg with unspecified severity: Secondary | ICD-10-CM | POA: Diagnosis not present

## 2017-12-07 DIAGNOSIS — N184 Chronic kidney disease, stage 4 (severe): Secondary | ICD-10-CM | POA: Diagnosis not present

## 2017-12-07 DIAGNOSIS — J189 Pneumonia, unspecified organism: Secondary | ICD-10-CM | POA: Diagnosis not present

## 2017-12-07 DIAGNOSIS — E1122 Type 2 diabetes mellitus with diabetic chronic kidney disease: Secondary | ICD-10-CM | POA: Diagnosis not present

## 2017-12-07 DIAGNOSIS — L97929 Non-pressure chronic ulcer of unspecified part of left lower leg with unspecified severity: Secondary | ICD-10-CM | POA: Diagnosis not present

## 2017-12-07 DIAGNOSIS — Z7901 Long term (current) use of anticoagulants: Secondary | ICD-10-CM | POA: Diagnosis not present

## 2017-12-07 DIAGNOSIS — Z794 Long term (current) use of insulin: Secondary | ICD-10-CM | POA: Diagnosis not present

## 2017-12-07 DIAGNOSIS — Z7982 Long term (current) use of aspirin: Secondary | ICD-10-CM | POA: Diagnosis not present

## 2017-12-07 DIAGNOSIS — Z4800 Encounter for change or removal of nonsurgical wound dressing: Secondary | ICD-10-CM | POA: Diagnosis not present

## 2017-12-07 DIAGNOSIS — I4891 Unspecified atrial fibrillation: Secondary | ICD-10-CM | POA: Diagnosis not present

## 2017-12-07 DIAGNOSIS — M71551 Other bursitis, not elsewhere classified, right hip: Secondary | ICD-10-CM | POA: Diagnosis not present

## 2017-12-07 DIAGNOSIS — Z7951 Long term (current) use of inhaled steroids: Secondary | ICD-10-CM | POA: Diagnosis not present

## 2017-12-07 NOTE — Telephone Encounter (Signed)
Spoke with Larene Beach RN [facility].  Pt came home from [place] last Tuesday.  Needs to know when to check INR.  Order given to check it on Wednesday 3/13 with results to me.

## 2017-12-08 ENCOUNTER — Ambulatory Visit (HOSPITAL_COMMUNITY): Payer: Medicare Other | Admitting: Physical Therapy

## 2017-12-08 DIAGNOSIS — D509 Iron deficiency anemia, unspecified: Secondary | ICD-10-CM | POA: Diagnosis not present

## 2017-12-08 DIAGNOSIS — D689 Coagulation defect, unspecified: Secondary | ICD-10-CM | POA: Diagnosis not present

## 2017-12-08 DIAGNOSIS — D631 Anemia in chronic kidney disease: Secondary | ICD-10-CM | POA: Diagnosis not present

## 2017-12-08 DIAGNOSIS — N186 End stage renal disease: Secondary | ICD-10-CM | POA: Diagnosis not present

## 2017-12-08 DIAGNOSIS — E876 Hypokalemia: Secondary | ICD-10-CM | POA: Diagnosis not present

## 2017-12-08 DIAGNOSIS — N2581 Secondary hyperparathyroidism of renal origin: Secondary | ICD-10-CM | POA: Diagnosis not present

## 2017-12-09 ENCOUNTER — Ambulatory Visit (INDEPENDENT_AMBULATORY_CARE_PROVIDER_SITE_OTHER): Payer: Medicare Other | Admitting: *Deleted

## 2017-12-09 DIAGNOSIS — E1122 Type 2 diabetes mellitus with diabetic chronic kidney disease: Secondary | ICD-10-CM | POA: Diagnosis not present

## 2017-12-09 DIAGNOSIS — I4891 Unspecified atrial fibrillation: Secondary | ICD-10-CM | POA: Diagnosis not present

## 2017-12-09 DIAGNOSIS — M71551 Other bursitis, not elsewhere classified, right hip: Secondary | ICD-10-CM | POA: Diagnosis not present

## 2017-12-09 DIAGNOSIS — I4892 Unspecified atrial flutter: Secondary | ICD-10-CM | POA: Diagnosis not present

## 2017-12-09 DIAGNOSIS — Z7982 Long term (current) use of aspirin: Secondary | ICD-10-CM | POA: Diagnosis not present

## 2017-12-09 DIAGNOSIS — Z7951 Long term (current) use of inhaled steroids: Secondary | ICD-10-CM | POA: Diagnosis not present

## 2017-12-09 DIAGNOSIS — N184 Chronic kidney disease, stage 4 (severe): Secondary | ICD-10-CM | POA: Diagnosis not present

## 2017-12-09 DIAGNOSIS — L97929 Non-pressure chronic ulcer of unspecified part of left lower leg with unspecified severity: Secondary | ICD-10-CM | POA: Diagnosis not present

## 2017-12-09 DIAGNOSIS — L97919 Non-pressure chronic ulcer of unspecified part of right lower leg with unspecified severity: Secondary | ICD-10-CM | POA: Diagnosis not present

## 2017-12-09 DIAGNOSIS — E1151 Type 2 diabetes mellitus with diabetic peripheral angiopathy without gangrene: Secondary | ICD-10-CM | POA: Diagnosis not present

## 2017-12-09 DIAGNOSIS — E1140 Type 2 diabetes mellitus with diabetic neuropathy, unspecified: Secondary | ICD-10-CM | POA: Diagnosis not present

## 2017-12-09 DIAGNOSIS — Z79891 Long term (current) use of opiate analgesic: Secondary | ICD-10-CM | POA: Diagnosis not present

## 2017-12-09 DIAGNOSIS — Z4800 Encounter for change or removal of nonsurgical wound dressing: Secondary | ICD-10-CM | POA: Diagnosis not present

## 2017-12-09 DIAGNOSIS — Z5181 Encounter for therapeutic drug level monitoring: Secondary | ICD-10-CM | POA: Diagnosis not present

## 2017-12-09 DIAGNOSIS — Z794 Long term (current) use of insulin: Secondary | ICD-10-CM | POA: Diagnosis not present

## 2017-12-09 DIAGNOSIS — Z7901 Long term (current) use of anticoagulants: Secondary | ICD-10-CM | POA: Diagnosis not present

## 2017-12-09 LAB — POCT INR
INR: 1.1
INR: 1.9

## 2017-12-09 NOTE — Patient Instructions (Signed)
INR 1.1  Suppose to be on coumadin 4mg  daily since d/c from SNF per [place]. Take coumadin 6mg  x 3 days (W,Th,F) then resume 4mg  daily   Recheck Monday 12/14/17 Order given to [facility]

## 2017-12-10 DIAGNOSIS — D509 Iron deficiency anemia, unspecified: Secondary | ICD-10-CM | POA: Diagnosis not present

## 2017-12-10 DIAGNOSIS — D631 Anemia in chronic kidney disease: Secondary | ICD-10-CM | POA: Diagnosis not present

## 2017-12-10 DIAGNOSIS — N2581 Secondary hyperparathyroidism of renal origin: Secondary | ICD-10-CM | POA: Diagnosis not present

## 2017-12-10 DIAGNOSIS — N186 End stage renal disease: Secondary | ICD-10-CM | POA: Diagnosis not present

## 2017-12-10 DIAGNOSIS — D689 Coagulation defect, unspecified: Secondary | ICD-10-CM | POA: Diagnosis not present

## 2017-12-10 DIAGNOSIS — E876 Hypokalemia: Secondary | ICD-10-CM | POA: Diagnosis not present

## 2017-12-11 DIAGNOSIS — Z7901 Long term (current) use of anticoagulants: Secondary | ICD-10-CM | POA: Diagnosis not present

## 2017-12-11 DIAGNOSIS — R269 Unspecified abnormalities of gait and mobility: Secondary | ICD-10-CM | POA: Diagnosis not present

## 2017-12-11 DIAGNOSIS — M6281 Muscle weakness (generalized): Secondary | ICD-10-CM | POA: Diagnosis not present

## 2017-12-11 DIAGNOSIS — I509 Heart failure, unspecified: Secondary | ICD-10-CM | POA: Diagnosis not present

## 2017-12-11 DIAGNOSIS — E1140 Type 2 diabetes mellitus with diabetic neuropathy, unspecified: Secondary | ICD-10-CM | POA: Diagnosis not present

## 2017-12-11 DIAGNOSIS — Z7951 Long term (current) use of inhaled steroids: Secondary | ICD-10-CM | POA: Diagnosis not present

## 2017-12-11 DIAGNOSIS — E1122 Type 2 diabetes mellitus with diabetic chronic kidney disease: Secondary | ICD-10-CM | POA: Diagnosis not present

## 2017-12-11 DIAGNOSIS — L97929 Non-pressure chronic ulcer of unspecified part of left lower leg with unspecified severity: Secondary | ICD-10-CM | POA: Diagnosis not present

## 2017-12-11 DIAGNOSIS — L97919 Non-pressure chronic ulcer of unspecified part of right lower leg with unspecified severity: Secondary | ICD-10-CM | POA: Diagnosis not present

## 2017-12-11 DIAGNOSIS — N184 Chronic kidney disease, stage 4 (severe): Secondary | ICD-10-CM | POA: Diagnosis not present

## 2017-12-11 DIAGNOSIS — Z794 Long term (current) use of insulin: Secondary | ICD-10-CM | POA: Diagnosis not present

## 2017-12-11 DIAGNOSIS — M71551 Other bursitis, not elsewhere classified, right hip: Secondary | ICD-10-CM | POA: Diagnosis not present

## 2017-12-11 DIAGNOSIS — E1151 Type 2 diabetes mellitus with diabetic peripheral angiopathy without gangrene: Secondary | ICD-10-CM | POA: Diagnosis not present

## 2017-12-11 DIAGNOSIS — Z79891 Long term (current) use of opiate analgesic: Secondary | ICD-10-CM | POA: Diagnosis not present

## 2017-12-11 DIAGNOSIS — I4891 Unspecified atrial fibrillation: Secondary | ICD-10-CM | POA: Diagnosis not present

## 2017-12-11 DIAGNOSIS — Z7982 Long term (current) use of aspirin: Secondary | ICD-10-CM | POA: Diagnosis not present

## 2017-12-11 DIAGNOSIS — Z4800 Encounter for change or removal of nonsurgical wound dressing: Secondary | ICD-10-CM | POA: Diagnosis not present

## 2017-12-12 DIAGNOSIS — N186 End stage renal disease: Secondary | ICD-10-CM | POA: Diagnosis not present

## 2017-12-12 DIAGNOSIS — E876 Hypokalemia: Secondary | ICD-10-CM | POA: Diagnosis not present

## 2017-12-12 DIAGNOSIS — D631 Anemia in chronic kidney disease: Secondary | ICD-10-CM | POA: Diagnosis not present

## 2017-12-12 DIAGNOSIS — N2581 Secondary hyperparathyroidism of renal origin: Secondary | ICD-10-CM | POA: Diagnosis not present

## 2017-12-12 DIAGNOSIS — D689 Coagulation defect, unspecified: Secondary | ICD-10-CM | POA: Diagnosis not present

## 2017-12-12 DIAGNOSIS — D509 Iron deficiency anemia, unspecified: Secondary | ICD-10-CM | POA: Diagnosis not present

## 2017-12-14 ENCOUNTER — Inpatient Hospital Stay (HOSPITAL_COMMUNITY)
Admission: EM | Admit: 2017-12-14 | Discharge: 2017-12-29 | DRG: 299 | Disposition: A | Discharge status: Skilled Nursing Facility | Payer: Medicare Other | Attending: Family Medicine | Admitting: Family Medicine

## 2017-12-14 ENCOUNTER — Emergency Department (HOSPITAL_COMMUNITY): Payer: Medicare Other

## 2017-12-14 ENCOUNTER — Encounter (HOSPITAL_COMMUNITY): Payer: Self-pay | Admitting: Emergency Medicine

## 2017-12-14 DIAGNOSIS — D631 Anemia in chronic kidney disease: Secondary | ICD-10-CM | POA: Diagnosis present

## 2017-12-14 DIAGNOSIS — Z7951 Long term (current) use of inhaled steroids: Secondary | ICD-10-CM | POA: Diagnosis not present

## 2017-12-14 DIAGNOSIS — N760 Acute vaginitis: Secondary | ICD-10-CM | POA: Diagnosis not present

## 2017-12-14 DIAGNOSIS — M329 Systemic lupus erythematosus, unspecified: Secondary | ICD-10-CM | POA: Diagnosis present

## 2017-12-14 DIAGNOSIS — L97213 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scle: Secondary | ICD-10-CM

## 2017-12-14 DIAGNOSIS — E1122 Type 2 diabetes mellitus with diabetic chronic kidney disease: Secondary | ICD-10-CM | POA: Diagnosis present

## 2017-12-14 DIAGNOSIS — E11621 Type 2 diabetes mellitus with foot ulcer: Secondary | ICD-10-CM | POA: Diagnosis not present

## 2017-12-14 DIAGNOSIS — L97529 Non-pressure chronic ulcer of other part of left foot with unspecified severity: Secondary | ICD-10-CM | POA: Diagnosis not present

## 2017-12-14 DIAGNOSIS — L03115 Cellulitis of right lower limb: Secondary | ICD-10-CM | POA: Diagnosis not present

## 2017-12-14 DIAGNOSIS — Z972 Presence of dental prosthetic device (complete) (partial): Secondary | ICD-10-CM

## 2017-12-14 DIAGNOSIS — E11628 Type 2 diabetes mellitus with other skin complications: Secondary | ICD-10-CM | POA: Diagnosis not present

## 2017-12-14 DIAGNOSIS — E1151 Type 2 diabetes mellitus with diabetic peripheral angiopathy without gangrene: Principal | ICD-10-CM | POA: Diagnosis present

## 2017-12-14 DIAGNOSIS — Z8249 Family history of ischemic heart disease and other diseases of the circulatory system: Secondary | ICD-10-CM

## 2017-12-14 DIAGNOSIS — Z7901 Long term (current) use of anticoagulants: Secondary | ICD-10-CM | POA: Diagnosis not present

## 2017-12-14 DIAGNOSIS — N184 Chronic kidney disease, stage 4 (severe): Secondary | ICD-10-CM | POA: Diagnosis not present

## 2017-12-14 DIAGNOSIS — I83212 Varicose veins of right lower extremity with both ulcer of calf and inflammation: Secondary | ICD-10-CM | POA: Diagnosis not present

## 2017-12-14 DIAGNOSIS — J438 Other emphysema: Secondary | ICD-10-CM | POA: Diagnosis not present

## 2017-12-14 DIAGNOSIS — G8929 Other chronic pain: Secondary | ICD-10-CM | POA: Diagnosis not present

## 2017-12-14 DIAGNOSIS — I4891 Unspecified atrial fibrillation: Secondary | ICD-10-CM | POA: Diagnosis not present

## 2017-12-14 DIAGNOSIS — G9341 Metabolic encephalopathy: Secondary | ICD-10-CM | POA: Diagnosis not present

## 2017-12-14 DIAGNOSIS — I480 Paroxysmal atrial fibrillation: Secondary | ICD-10-CM | POA: Diagnosis present

## 2017-12-14 DIAGNOSIS — Z87828 Personal history of other (healed) physical injury and trauma: Secondary | ICD-10-CM | POA: Diagnosis not present

## 2017-12-14 DIAGNOSIS — E1140 Type 2 diabetes mellitus with diabetic neuropathy, unspecified: Secondary | ICD-10-CM | POA: Diagnosis present

## 2017-12-14 DIAGNOSIS — Z992 Dependence on renal dialysis: Secondary | ICD-10-CM | POA: Diagnosis not present

## 2017-12-14 DIAGNOSIS — Z888 Allergy status to other drugs, medicaments and biological substances status: Secondary | ICD-10-CM

## 2017-12-14 DIAGNOSIS — I120 Hypertensive chronic kidney disease with stage 5 chronic kidney disease or end stage renal disease: Secondary | ICD-10-CM | POA: Diagnosis not present

## 2017-12-14 DIAGNOSIS — R443 Hallucinations, unspecified: Secondary | ICD-10-CM | POA: Diagnosis not present

## 2017-12-14 DIAGNOSIS — M71551 Other bursitis, not elsewhere classified, right hip: Secondary | ICD-10-CM | POA: Diagnosis not present

## 2017-12-14 DIAGNOSIS — I132 Hypertensive heart and chronic kidney disease with heart failure and with stage 5 chronic kidney disease, or end stage renal disease: Secondary | ICD-10-CM | POA: Diagnosis not present

## 2017-12-14 DIAGNOSIS — L97519 Non-pressure chronic ulcer of other part of right foot with unspecified severity: Secondary | ICD-10-CM

## 2017-12-14 DIAGNOSIS — S90821A Blister (nonthermal), right foot, initial encounter: Secondary | ICD-10-CM | POA: Diagnosis not present

## 2017-12-14 DIAGNOSIS — H409 Unspecified glaucoma: Secondary | ICD-10-CM | POA: Diagnosis not present

## 2017-12-14 DIAGNOSIS — E8889 Other specified metabolic disorders: Secondary | ICD-10-CM | POA: Diagnosis not present

## 2017-12-14 DIAGNOSIS — M3214 Glomerular disease in systemic lupus erythematosus: Secondary | ICD-10-CM | POA: Diagnosis not present

## 2017-12-14 DIAGNOSIS — E119 Type 2 diabetes mellitus without complications: Secondary | ICD-10-CM | POA: Diagnosis not present

## 2017-12-14 DIAGNOSIS — E11649 Type 2 diabetes mellitus with hypoglycemia without coma: Secondary | ICD-10-CM | POA: Diagnosis not present

## 2017-12-14 DIAGNOSIS — E1129 Type 2 diabetes mellitus with other diabetic kidney complication: Secondary | ICD-10-CM | POA: Diagnosis not present

## 2017-12-14 DIAGNOSIS — R262 Difficulty in walking, not elsewhere classified: Secondary | ICD-10-CM | POA: Diagnosis not present

## 2017-12-14 DIAGNOSIS — L03119 Cellulitis of unspecified part of limb: Secondary | ICD-10-CM | POA: Diagnosis not present

## 2017-12-14 DIAGNOSIS — Z794 Long term (current) use of insulin: Secondary | ICD-10-CM | POA: Diagnosis not present

## 2017-12-14 DIAGNOSIS — Z4800 Encounter for change or removal of nonsurgical wound dressing: Secondary | ICD-10-CM | POA: Diagnosis not present

## 2017-12-14 DIAGNOSIS — Z79899 Other long term (current) drug therapy: Secondary | ICD-10-CM

## 2017-12-14 DIAGNOSIS — J449 Chronic obstructive pulmonary disease, unspecified: Secondary | ICD-10-CM | POA: Diagnosis not present

## 2017-12-14 DIAGNOSIS — I5032 Chronic diastolic (congestive) heart failure: Secondary | ICD-10-CM | POA: Diagnosis not present

## 2017-12-14 DIAGNOSIS — E785 Hyperlipidemia, unspecified: Secondary | ICD-10-CM | POA: Diagnosis not present

## 2017-12-14 DIAGNOSIS — I70238 Atherosclerosis of native arteries of right leg with ulceration of other part of lower right leg: Secondary | ICD-10-CM | POA: Diagnosis not present

## 2017-12-14 DIAGNOSIS — Z90710 Acquired absence of both cervix and uterus: Secondary | ICD-10-CM

## 2017-12-14 DIAGNOSIS — Z7952 Long term (current) use of systemic steroids: Secondary | ICD-10-CM

## 2017-12-14 DIAGNOSIS — I10 Essential (primary) hypertension: Secondary | ICD-10-CM | POA: Diagnosis not present

## 2017-12-14 DIAGNOSIS — L97911 Non-pressure chronic ulcer of unspecified part of right lower leg limited to breakdown of skin: Secondary | ICD-10-CM | POA: Diagnosis not present

## 2017-12-14 DIAGNOSIS — R109 Unspecified abdominal pain: Secondary | ICD-10-CM

## 2017-12-14 DIAGNOSIS — E782 Mixed hyperlipidemia: Secondary | ICD-10-CM | POA: Diagnosis present

## 2017-12-14 DIAGNOSIS — Z833 Family history of diabetes mellitus: Secondary | ICD-10-CM

## 2017-12-14 DIAGNOSIS — Z87891 Personal history of nicotine dependence: Secondary | ICD-10-CM

## 2017-12-14 DIAGNOSIS — L97919 Non-pressure chronic ulcer of unspecified part of right lower leg with unspecified severity: Secondary | ICD-10-CM | POA: Diagnosis not present

## 2017-12-14 DIAGNOSIS — R410 Disorientation, unspecified: Secondary | ICD-10-CM | POA: Diagnosis not present

## 2017-12-14 DIAGNOSIS — L97929 Non-pressure chronic ulcer of unspecified part of left lower leg with unspecified severity: Secondary | ICD-10-CM | POA: Diagnosis not present

## 2017-12-14 DIAGNOSIS — R1030 Lower abdominal pain, unspecified: Secondary | ICD-10-CM | POA: Diagnosis not present

## 2017-12-14 DIAGNOSIS — T40605A Adverse effect of unspecified narcotics, initial encounter: Secondary | ICD-10-CM | POA: Diagnosis not present

## 2017-12-14 DIAGNOSIS — N186 End stage renal disease: Secondary | ICD-10-CM | POA: Diagnosis not present

## 2017-12-14 DIAGNOSIS — Z86718 Personal history of other venous thrombosis and embolism: Secondary | ICD-10-CM

## 2017-12-14 DIAGNOSIS — L988 Other specified disorders of the skin and subcutaneous tissue: Secondary | ICD-10-CM | POA: Diagnosis not present

## 2017-12-14 DIAGNOSIS — L97211 Non-pressure chronic ulcer of right calf limited to breakdown of skin: Secondary | ICD-10-CM | POA: Diagnosis not present

## 2017-12-14 DIAGNOSIS — M159 Polyosteoarthritis, unspecified: Secondary | ICD-10-CM | POA: Diagnosis not present

## 2017-12-14 DIAGNOSIS — K5909 Other constipation: Secondary | ICD-10-CM | POA: Diagnosis not present

## 2017-12-14 DIAGNOSIS — Z79891 Long term (current) use of opiate analgesic: Secondary | ICD-10-CM | POA: Diagnosis not present

## 2017-12-14 DIAGNOSIS — I4892 Unspecified atrial flutter: Secondary | ICD-10-CM | POA: Diagnosis present

## 2017-12-14 DIAGNOSIS — N2581 Secondary hyperparathyroidism of renal origin: Secondary | ICD-10-CM | POA: Diagnosis not present

## 2017-12-14 DIAGNOSIS — L98499 Non-pressure chronic ulcer of skin of other sites with unspecified severity: Secondary | ICD-10-CM | POA: Diagnosis not present

## 2017-12-14 DIAGNOSIS — Z7982 Long term (current) use of aspirin: Secondary | ICD-10-CM | POA: Diagnosis not present

## 2017-12-14 HISTORY — DX: Polyp of colon: K63.5

## 2017-12-14 HISTORY — DX: Unspecified glaucoma: H40.9

## 2017-12-14 LAB — COMPREHENSIVE METABOLIC PANEL
ALT: 18 U/L (ref 14–54)
AST: 21 U/L (ref 15–41)
Albumin: 2.7 g/dL — ABNORMAL LOW (ref 3.5–5.0)
Alkaline Phosphatase: 76 U/L (ref 38–126)
Anion gap: 12 (ref 5–15)
BUN: 41 mg/dL — ABNORMAL HIGH (ref 6–20)
CHLORIDE: 98 mmol/L — AB (ref 101–111)
CO2: 28 mmol/L (ref 22–32)
CREATININE: 4.17 mg/dL — AB (ref 0.44–1.00)
Calcium: 9.3 mg/dL (ref 8.9–10.3)
GFR, EST AFRICAN AMERICAN: 11 mL/min — AB (ref >60)
GFR, EST NON AFRICAN AMERICAN: 10 mL/min — AB (ref >60)
Glucose, Bld: 67 mg/dL (ref 65–99)
Potassium: 4 mmol/L (ref 3.5–5.1)
Sodium: 138 mmol/L (ref 135–145)
Total Bilirubin: 0.4 mg/dL (ref 0.3–1.2)
Total Protein: 7.1 g/dL (ref 6.5–8.1)

## 2017-12-14 LAB — CBC WITH DIFFERENTIAL/PLATELET
BASOS ABS: 0 10*3/uL (ref 0.0–0.1)
Basophils Relative: 0 %
EOS ABS: 0 10*3/uL (ref 0.0–0.7)
Eosinophils Relative: 0 %
HCT: 32.9 % — ABNORMAL LOW (ref 36.0–46.0)
HEMOGLOBIN: 9.7 g/dL — AB (ref 12.0–15.0)
LYMPHS ABS: 1.5 10*3/uL (ref 0.7–4.0)
LYMPHS PCT: 12 %
MCH: 28.5 pg (ref 26.0–34.0)
MCHC: 29.5 g/dL — ABNORMAL LOW (ref 30.0–36.0)
MCV: 96.8 fL (ref 78.0–100.0)
Monocytes Absolute: 1.1 10*3/uL — ABNORMAL HIGH (ref 0.1–1.0)
Monocytes Relative: 8 %
NEUTROS PCT: 80 %
Neutro Abs: 10.1 10*3/uL — ABNORMAL HIGH (ref 1.7–7.7)
Platelets: 266 10*3/uL (ref 150–400)
RBC: 3.4 MIL/uL — AB (ref 3.87–5.11)
RDW: 18 % — ABNORMAL HIGH (ref 11.5–15.5)
WBC: 12.7 10*3/uL — AB (ref 4.0–10.5)

## 2017-12-14 LAB — SEDIMENTATION RATE: SED RATE: 80 mm/h — AB (ref 0–22)

## 2017-12-14 LAB — CBG MONITORING, ED: GLUCOSE-CAPILLARY: 76 mg/dL (ref 65–99)

## 2017-12-14 LAB — LACTIC ACID, PLASMA: Lactic Acid, Venous: 1.2 mmol/L (ref 0.5–1.9)

## 2017-12-14 LAB — PROTIME-INR
INR: 1.87
PROTHROMBIN TIME: 21.4 s — AB (ref 11.4–15.2)

## 2017-12-14 MED ORDER — METOPROLOL TARTRATE 25 MG PO TABS: 25.0000 mg | ORAL_TABLET | ORAL | Status: DC

## 2017-12-14 MED ORDER — WARFARIN SODIUM 5 MG PO TABS
5.0000 mg | ORAL_TABLET | Freq: Once | ORAL | Status: AC
  Administered 2017-12-15: 5 mg via ORAL
  Filled 2017-12-14: qty 1

## 2017-12-14 MED ORDER — FENTANYL CITRATE (PF) 100 MCG/2ML IJ SOLN
50.0000 ug | INTRAMUSCULAR | Status: AC | PRN
  Administered 2017-12-15 (×2): 50 ug via INTRAVENOUS
  Filled 2017-12-14 (×2): qty 2

## 2017-12-14 MED ORDER — METOPROLOL TARTRATE 50 MG PO TABS
50.0000 mg | ORAL_TABLET | ORAL | Status: DC
  Administered 2017-12-15 – 2017-12-28 (×15): 50 mg via ORAL
  Filled 2017-12-14 (×15): qty 1

## 2017-12-14 MED ORDER — PIPERACILLIN-TAZOBACTAM 3.375 G IVPB
3.3750 g | Freq: Two times a day (BID) | INTRAVENOUS | Status: DC
  Administered 2017-12-14 – 2017-12-15 (×2): 3.375 g via INTRAVENOUS
  Filled 2017-12-14 (×2): qty 50

## 2017-12-14 MED ORDER — FEBUXOSTAT 40 MG PO TABS
40.0000 mg | ORAL_TABLET | Freq: Every day | ORAL | Status: DC
  Administered 2017-12-15 – 2017-12-29 (×15): 40 mg via ORAL
  Filled 2017-12-14 (×15): qty 1

## 2017-12-14 MED ORDER — HYDROXYCHLOROQUINE SULFATE 200 MG PO TABS
300.0000 mg | ORAL_TABLET | Freq: Every day | ORAL | Status: DC
  Administered 2017-12-15 – 2017-12-29 (×15): 300 mg via ORAL
  Filled 2017-12-14: qty 1.5
  Filled 2017-12-14 (×2): qty 2
  Filled 2017-12-14: qty 1.5
  Filled 2017-12-14 (×11): qty 2
  Filled 2017-12-14: qty 1.5

## 2017-12-14 MED ORDER — BRIMONIDINE TARTRATE-TIMOLOL 0.2-0.5 % OP SOLN
1.0000 [drp] | Freq: Every day | OPHTHALMIC | Status: DC
  Filled 2017-12-14: qty 5

## 2017-12-14 MED ORDER — ACETAMINOPHEN 325 MG PO TABS
650.0000 mg | ORAL_TABLET | Freq: Four times a day (QID) | ORAL | Status: DC | PRN
  Administered 2017-12-15 – 2017-12-27 (×9): 650 mg via ORAL
  Filled 2017-12-14 (×7): qty 2

## 2017-12-14 MED ORDER — PREDNISONE 5 MG PO TABS
5.0000 mg | ORAL_TABLET | Freq: Every day | ORAL | Status: DC
  Administered 2017-12-15 – 2017-12-29 (×15): 5 mg via ORAL
  Filled 2017-12-14 (×15): qty 1

## 2017-12-14 MED ORDER — VITAMIN D (ERGOCALCIFEROL) 1.25 MG (50000 UNIT) PO CAPS
50000.0000 [IU] | ORAL_CAPSULE | ORAL | Status: DC
  Administered 2017-12-15: 50000 [IU] via ORAL
  Filled 2017-12-14: qty 1

## 2017-12-14 MED ORDER — ALBUTEROL SULFATE (2.5 MG/3ML) 0.083% IN NEBU: 2.5000 mg | INHALATION_SOLUTION | Freq: Four times a day (QID) | RESPIRATORY_TRACT | Status: DC | PRN

## 2017-12-14 MED ORDER — CALCIUM ACETATE (PHOS BINDER) 667 MG PO CAPS
1334.0000 mg | ORAL_CAPSULE | Freq: Three times a day (TID) | ORAL | Status: DC
  Administered 2017-12-15 – 2017-12-16 (×5): 1334 mg via ORAL
  Filled 2017-12-14 (×5): qty 2

## 2017-12-14 MED ORDER — POLYSACCHARIDE IRON COMPLEX 150 MG PO CAPS
150.0000 mg | ORAL_CAPSULE | Freq: Three times a day (TID) | ORAL | Status: DC
  Administered 2017-12-15 (×3): 150 mg via ORAL
  Filled 2017-12-14 (×3): qty 1

## 2017-12-14 MED ORDER — LATANOPROST 0.005 % OP SOLN
1.0000 [drp] | Freq: Every day | OPHTHALMIC | Status: DC
  Administered 2017-12-15 – 2017-12-28 (×14): 1 [drp] via OPHTHALMIC
  Filled 2017-12-14 (×4): qty 2.5

## 2017-12-14 MED ORDER — ENOXAPARIN SODIUM 30 MG/0.3ML ~~LOC~~ SOLN: 30.0000 mg | SUBCUTANEOUS | Status: DC

## 2017-12-14 MED ORDER — ONDANSETRON HCL 4 MG/2ML IJ SOLN: 4.0000 mg | Freq: Four times a day (QID) | INTRAMUSCULAR | Status: DC | PRN

## 2017-12-14 MED ORDER — IPRATROPIUM BROMIDE 0.02 % IN SOLN: 0.5000 mg | Freq: Four times a day (QID) | RESPIRATORY_TRACT | Status: DC | PRN

## 2017-12-14 MED ORDER — ACETAMINOPHEN 650 MG RE SUPP: 650.0000 mg | Freq: Four times a day (QID) | RECTAL | Status: DC | PRN

## 2017-12-14 MED ORDER — FOLIC ACID 1 MG PO TABS
1.0000 mg | ORAL_TABLET | Freq: Every day | ORAL | Status: DC
  Administered 2017-12-15 – 2017-12-29 (×15): 1 mg via ORAL
  Filled 2017-12-14 (×15): qty 1

## 2017-12-14 MED ORDER — WARFARIN - PHARMACIST DOSING INPATIENT
Status: DC
  Administered 2017-12-16: 18:00:00

## 2017-12-14 MED ORDER — COLLAGENASE 250 UNIT/GM EX OINT
1.0000 "application " | TOPICAL_OINTMENT | Freq: Every day | CUTANEOUS | Status: DC
  Filled 2017-12-14: qty 30

## 2017-12-14 MED ORDER — INSULIN DETEMIR 100 UNIT/ML ~~LOC~~ SOLN
7.0000 [IU] | Freq: Every day | SUBCUTANEOUS | Status: DC
  Filled 2017-12-14 (×2): qty 0.07

## 2017-12-14 MED ORDER — KETOROLAC TROMETHAMINE 0.5 % OP SOLN
1.0000 [drp] | Freq: Four times a day (QID) | OPHTHALMIC | Status: DC
  Administered 2017-12-15 – 2017-12-29 (×46): 1 [drp] via OPHTHALMIC
  Filled 2017-12-14 (×4): qty 3

## 2017-12-14 MED ORDER — INSULIN ASPART 100 UNIT/ML ~~LOC~~ SOLN
0.0000 [IU] | Freq: Three times a day (TID) | SUBCUTANEOUS | Status: DC
  Administered 2017-12-15 – 2017-12-17 (×4): 2 [IU] via SUBCUTANEOUS
  Administered 2017-12-18 – 2017-12-21 (×4): 1 [IU] via SUBCUTANEOUS
  Administered 2017-12-23 – 2017-12-27 (×4): 2 [IU] via SUBCUTANEOUS
  Administered 2017-12-27: 5 [IU] via SUBCUTANEOUS
  Administered 2017-12-28: 1 [IU] via SUBCUTANEOUS
  Administered 2017-12-28 (×2): 2 [IU] via SUBCUTANEOUS

## 2017-12-14 MED ORDER — HEPARIN SODIUM (PORCINE) 5000 UNIT/ML IJ SOLN
5000.0000 [IU] | Freq: Three times a day (TID) | INTRAMUSCULAR | Status: DC
  Administered 2017-12-15 (×2): 5000 [IU] via SUBCUTANEOUS
  Filled 2017-12-14 (×2): qty 1

## 2017-12-14 MED ORDER — OXYCODONE HCL 5 MG PO TABS
5.0000 mg | ORAL_TABLET | Freq: Four times a day (QID) | ORAL | Status: DC | PRN
  Administered 2017-12-15 – 2017-12-17 (×8): 5 mg via ORAL
  Filled 2017-12-14 (×6): qty 1

## 2017-12-14 MED ORDER — ONDANSETRON HCL 4 MG PO TABS
4.0000 mg | ORAL_TABLET | Freq: Four times a day (QID) | ORAL | Status: DC | PRN
  Administered 2017-12-15 – 2017-12-16 (×2): 4 mg via ORAL
  Filled 2017-12-14 (×2): qty 1

## 2017-12-14 MED ORDER — IPRATROPIUM-ALBUTEROL 0.5-2.5 (3) MG/3ML IN SOLN: 3.0000 mL | Freq: Four times a day (QID) | RESPIRATORY_TRACT | Status: DC | PRN

## 2017-12-14 MED ORDER — FENTANYL CITRATE (PF) 100 MCG/2ML IJ SOLN
50.0000 ug | INTRAMUSCULAR | Status: DC | PRN
  Administered 2017-12-14: 50 ug via INTRAVENOUS
  Filled 2017-12-14: qty 2

## 2017-12-14 MED ORDER — ATORVASTATIN CALCIUM 20 MG PO TABS
20.0000 mg | ORAL_TABLET | Freq: Every day | ORAL | Status: DC
  Administered 2017-12-15 – 2017-12-29 (×15): 20 mg via ORAL
  Filled 2017-12-14 (×15): qty 1

## 2017-12-14 MED ORDER — DICYCLOMINE HCL 10 MG PO CAPS
10.0000 mg | ORAL_CAPSULE | Freq: Three times a day (TID) | ORAL | Status: DC
  Administered 2017-12-15 – 2017-12-25 (×32): 10 mg via ORAL
  Filled 2017-12-14 (×31): qty 1

## 2017-12-14 MED ORDER — ONDANSETRON HCL 4 MG/2ML IJ SOLN
4.0000 mg | Freq: Once | INTRAMUSCULAR | Status: AC
  Administered 2017-12-14: 4 mg via INTRAVENOUS
  Filled 2017-12-14: qty 2

## 2017-12-14 MED ORDER — VANCOMYCIN HCL 10 G IV SOLR
1500.0000 mg | Freq: Once | INTRAVENOUS | Status: AC
  Administered 2017-12-14: 1500 mg via INTRAVENOUS
  Filled 2017-12-14 (×2): qty 1500

## 2017-12-14 NOTE — Progress Notes (Addendum)
Pharmacy Antibiotic Note  [name] is a 72 y.o. female admitted on 12/14/2017 with cellulitis and r/o osteomyelitis.  Pharmacy has been consulted for Vancomcyin dosing.  Plan: Vancomycin 1500mg  IV x 1 Monitor labs, micro and vitals.  F/U am labs on 3/19 for further Vancomcyin dosing and HD schedule Zosyn 3.375gm IV every 12 hours  Height: 5\' 4"  (162.6 cm) Weight: 179 lb (81.2 kg) IBW/kg (Calculated) : 54.7  Temp (24hrs), Avg:98.5 F (36.9 C), Min:98.5 F (36.9 C), Max:98.5 F (36.9 C)  Recent Labs  Lab 12/14/17 1630 12/14/17 1631  WBC 12.7*  --   CREATININE 4.17*  --   LATICACIDVEN  --  1.2    Estimated Creatinine Clearance: 12.6 mL/min (A) (by C-G formula based on SCr of 4.17 mg/dL (H)).    Allergies  Allergen Reactions  . Ace Inhibitors Cough    Antimicrobials this admission: Vanc 3/18 >>  Zosyn 3/18  >> 3/19  Dose adjustments this admission:3 n/a   Microbiology results: 3/18 BCx: ngtd  UCx:    Sputum:    MRSA PCR:   Thank you for allowing pharmacy to be a part of this patient's care.  Pricilla Larsson 12/14/2017 7:19 PM   Addendum:  Continue Vancomycin 1000mg  IV after HD Tuesday, Thursday, and Saturday  Isac Sarna, BS [place], BCPS Clinical Pharmacist Pager [phone]

## 2017-12-14 NOTE — Progress Notes (Signed)
ANTICOAGULATION CONSULT NOTE - Initial Consult  Pharmacy Consult for Warfarin Indication: atrial fibrillation  Allergies  Allergen Reactions  . Ace Inhibitors Cough    Patient Measurements: Height: 5\' 4"  (162.6 cm) Weight: 179 lb (81.2 kg) IBW/kg (Calculated) : 54.7  Vital Signs: Temp: 98.5 F (36.9 C) (03/18 1549) Temp Source: Oral (03/18 1549) BP: 167/59 (03/18 1900) Pulse Rate: 130 (03/18 1700)  Labs: Recent Labs    12/14/17 1630  HGB 9.7*  HCT 32.9*  PLT 266  LABPROT 21.4*  INR 1.87  CREATININE 4.17*   Estimated Creatinine Clearance: 12.6 mL/min (A) (by C-G formula based on SCr of 4.17 mg/dL (H)).  Medical History: Past Medical History:  Diagnosis Date  . Anemia   . Arthritis   . Asthma   . Atrial fibrillation ([place])   . Atrial flutter [facility])    TEE/DCCV December 2013 - on Xarelto [facility])  . Bone spur of toe    Right 5th toe  . Chronic diastolic CHF (congestive heart failure) (HCC)    a. EF 50-55% by echo in 2015  . CKD (chronic kidney disease) stage 4, GFR 15-29 ml/min (H[facility])    a. s/p fistula placement but not on HD. Followed by Nephrology.   . Colon polyposis   . COPD (chronic obstructive pulmonary disease) ([place])   . DVT of upper extremity (deep vein thrombosis) ([place])    Right basilic vein, June 8841  . Essential hypertension, benign   . Fractures   . SLE (systemic lupus erythematosus) ([place])   . Type 2 diabetes mellitus ([place])   . UTI (lower urinary tract infection)    Medications:   (Not in a hospital admission)  Home Warfarin 4mg  daily  Assessment: Okay for Protocol, no bleeding noted. Admission H&P pending. INR below goal, last dose 3/17.  Goal of Therapy:  INR 2-3   Plan:  Warfarin 5mg  PO x 1. Daily PT / INR Monitor for signs and symptoms of bleeding.   Pricilla Larsson 12/14/2017,8:59 PM

## 2017-12-14 NOTE — H&P (Signed)
History and Physical    [name] ZJI:[id number] DOB: [birth year] DOA: 12/14/2017  PCP: Sharilyn Sites, MD   Patient coming from: Home/via Dr. Jake Shark office.  I have personally briefly reviewed patient's old medical records in [place]  Chief Complaint: Leg wound.  HPI: [name] is a 72 y.o. female with medical history significant of ESRD on hemodialysis, anemia or renal disease, osteoarthritis, COPD, atrial fibrillation/flutter, on support hose, colon polyposis, DVT of right upper extremity, history of hypertension, history of lupus, type 2 diabetes, history of a UTI who is coming to the emergency department referred by Dr. Delanna Ahmadi for evaluation of worsening chronic right diabetic foot with cellulitis of the right calf.  Per patient, she has had these wound for several months since the fall.  She does not recall any traumatic event to it.  She was following up at the wound clinic, but stopped going there in January due to changing health insurance.  Since then, she has noticed worsening of the wound and has been having worsening pain for the past few days.  She denies fever, chills, nausea, emesis, headache, dyspnea, chest pain, palpitations, dizziness, diaphoresis, abdominal pain, diarrhea, melena or hematochezia.  She states that she gets frequently constipated.  No dysuria, frequency or hematuria.  No polyuria, polydipsia or blurred vision.  ED Course: Initial vital signs temperature 36.9C (98.5 F), pulse 76, respirations 16, blood pressure 130/65 mmHg and O2 sat 100% on room air.  She received fentanyl 50 mcg IVP and vancomycin in the ED.  I added ondansetron 4 mg IVP and Zosyn per pharmacy.  Her white count was 12.7, hemoglobin 9.7 g/dL and platelets 266. PT was 21.4 seconds and INR 1.97.  Lactic acid was 1.2, sodium 138, potassium 4.0, chloride 98 and CO2 28 millimol/L.  BUN 41, creatinine 4.17, glucose 67 calcium 9.3 mg/dL (calcium corrected to albumin 10.3  mg/dL).  LFTs show an albumin of 2.7 g/dL, but all other values are normal.  Imaging: Chest radiograph showed cardiomegaly, but did not show any active cardio pulmonary disease.  Right tibia and fibula x-ray did not show any acute osseous abnormality.  There was an old fracture deformity of the distal fibula and a small ulcer in posterior soft tissues of the mid right calf.  Right foot x-ray did not show any acute fracture or malalignment.  There were possible tiny erosions of the tuft of the second and third distal phalanges.  MRI suggested if osteomyelitis is suspected.  In September 03, 2017, she had a lower extremity arterial duplex evaluation showed a patent right superficial femoral artery with velocities suggestive of a 75-99% stenosis involving the distal segment.  Patent left lower extremity arterial system without evidence of hemodynamically significant stenosis.  However diffuse atherosclerosis disease of the third through.  Circumferential calcification present in the arteries of the cough  Review of Systems: As per HPI otherwise 10 point review of systems negative.    Past Medical History:  Diagnosis Date  . Anemia   . Arthritis   . Asthma   . Atrial fibrillation ([place])   . Atrial flutter [facility])    TEE/DCCV December 2013 - on Xarelto [facility])  . Bone spur of toe    Right 5th toe  . Chronic diastolic CHF (congestive heart failure) (HCC)    a. EF 50-55% by echo in 2015  . CKD (chronic kidney disease) stage 4, GFR 15-29 ml/min (HCC)    a. s/p fistula placement but not on HD.  Followed by Nephrology.   . Colon polyposis   . COPD (chronic obstructive pulmonary disease) ([place])   . DVT of upper extremity (deep vein thrombosis) ([place])    Right basilic vein, June 4098  . Essential hypertension, benign   . Fractures   . SLE (systemic lupus erythematosus) ([place])   . Type 2 diabetes mellitus ([place])   . UTI (lower urinary tract infection)     Past Surgical History:  Procedure Laterality Date    . ABDOMINAL HYSTERECTOMY  1983  . ANKLE SURGERY  1993  . AV FISTULA PLACEMENT Left 03/27/2014   Procedure: ARTERIOVENOUS (AV) FISTULA CREATION;  Surgeon: Rosetta Posner, MD;  Location: [facility];  Service: Vascular;  Laterality: Left;  . BACK SURGERY  1980  . CARDIOVASCULAR STRESS TEST  12/19/2009   no stress induced rhythm abnormalities, ekg negative for ischemia  . CARDIOVERSION  09/16/2012   Procedure: CARDIOVERSION;  Surgeon: Sanda Klein, MD;  Location: [facility] ENDOSCOPY;  Service: Cardiovascular;  Laterality: N/A;  . COLONOSCOPY  09/20/2012   Dr. [name]: multiple tubular adenomas   . COLONOSCOPY  2005   Dr. [name]. Polyps, path unknown   . COLONOSCOPY N/A 07/24/2016   Procedure: COLONOSCOPY;  Surgeon: [name], MD;  [facility]O [facility];  Service: Endoscopy;  Laterality: N/A;  9:00 am  . ESOPHAGOGASTRODUODENOSCOPY  09/20/2012   Dr. [name]: duodenal erosion and possible resolving ulcer at the angularis   . IR FLUORO GUIDE CV LINE RIGHT  11/09/2017  . IR US GUIDE VASC ACCESS RIGHT  11/09/2017  . TEE WITHOUT CARDIOVERSION  09/16/2012   Procedure: TRANSESOPHAGEAL ECHOCARDIOGRAM (TEE);  Surgeon: Sanda Klein, MD;  Location: [facility] ENDOSCOPY;  Service: Cardiovascular;  Laterality: N/A;  . TRANSESOPHAGEAL ECHOCARDIOGRAM WITH CARDIOVERSION  09/16/2012   EF 60-65%, moderate LVH, moderate regurg of the aortic valve, LA moderately dilated     reports that she quit smoking about 5 years ago. Her smoking use included cigarettes. She has a 30.00 pack-year smoking history. she has never used smokeless tobacco. She reports that she does not drink alcohol or use drugs.  Allergies  Allergen Reactions  . Ace Inhibitors Cough    Family History  Problem Relation Age of Onset  . Diabetes Brother   . Diabetes Father   . Hypertension Father   . Hypertension Sister   . Stroke Mother   . Diabetes Sister   . Hypertension Brother   . Colon cancer Neg Hx     Prior to Admission medications    Medication Sig Start Date End Date Taking? Authorizing Provider  acetaminophen (TYLENOL) 500 MG tablet Take 1,000 mg by mouth every 6 (six) hours as needed. For pain   Yes [provider]  albuterol (ACCUNEB) 0.63 MG/3ML nebulizer solution Take 1 ampule by nebulization every 6 (six) hours as needed for wheezing or shortness of breath.    Yes [provider]  albuterol (PROVENTIL HFA;VENTOLIN HFA) 108 (90 BASE) MCG/ACT inhaler Inhale 2 puffs into the lungs every 6 (six) hours as needed. For shortness of breath   Yes [provider]  atorvastatin (LIPITOR) 20 MG tablet Take 20 mg by mouth daily.   Yes [provider]  brimonidine-timolol (COMBIGAN) 0.2-0.5 % ophthalmic solution Place 1 drop into the left eye daily.    Yes [provider]  calcium acetate (PHOSLO) 667 MG capsule Take 2 capsules (1,334 mg total) by mouth 3 (three) times daily with meals. 11/13/17  Yes Manuella Ghazi, Pratik D, DO  collagenase Annitta Needs)  ointment Apply 1 application topically daily.   Yes [provider]  diclofenac (VOLTAREN) 0.1 % ophthalmic solution Place 1 drop into the left eye daily. Uses at lunch time   Yes [provider]  dicyclomine (BENTYL) 10 MG capsule Take 10 mg by mouth 4 (four) times daily -  before meals and at bedtime.   Yes [provider]  febuxostat (ULORIC) 40 MG tablet Take 40 mg by mouth daily.   Yes [provider]  folic acid (FOLVITE) 1 MG tablet Take 1 mg by mouth daily.   Yes [provider]  HYDROcodone-acetaminophen (NORCO/VICODIN) 5-325 MG tablet Take 1-2 tablets by mouth every 8 (eight) hours as needed for moderate pain. 11/25/17  Yes Lassen, Arlo C, PA-C  hydroxychloroquine (PLAQUENIL) 200 MG tablet Take 300 mg by mouth as directed. Take 1 1/2 tablets daily   Yes [provider]  Insulin Detemir (LEVEMIR FLEXTOUCH) 100 UNIT/ML Pen Inject 7 Units into the skin daily at 10 pm.   Yes [provider]   insulin lispro (HUMALOG KWIKPEN) 100 UNIT/ML KiwkPen Give per sliding scale from 2-10 units subcutaneous three times a day.   Yes [provider]  iron polysaccharides (NIFEREX) 150 MG capsule Take 150 mg by mouth 3 (three) times daily.    Yes [provider]  metoprolol tartrate (LOPRESSOR) 50 MG tablet Take 50 mg by mouth 2 (two) times daily. Take 1 tablet twice a day, except on Tuesday, Thursday, Saturday, take 1 tablet daily in the morning.   Yes [provider]  Multiple Vitamins-Minerals (CENTRUM SILVER PO) Take 1 tablet by mouth daily.   Yes [provider]  predniSONE (DELTASONE) 5 MG tablet Take 5 mg by mouth daily with breakfast.   Yes [provider]  travoprost, benzalkonium, (TRAVATAN) 0.004 % ophthalmic solution Place 1 drop into the left eye at bedtime.   Yes [provider]  Vitamin D, Ergocalciferol, (DRISDOL) 50000 units CAPS capsule TAKE 1 CAPSULE BY MOUTH ONCE WEEKLY. 09/08/17  Yes Cassandria Anger, MD  warfarin (COUMADIN) 4 MG tablet Take 4 mg by mouth daily.   Yes [provider]    Physical Exam: Vitals:   12/14/17 1800 12/14/17 1830 12/14/17 1900 12/14/17 2100  BP: (!) 150/42 (!) 157/89 (!) 167/59 (!) 172/43  Pulse:    82  Resp:    18  Temp:      TempSrc:      SpO2:    100%  Weight:      Height:        Constitutional: NAD, calm, comfortable Eyes: PERRL, lids and conjunctivae normal ENMT: Mucous membranes are moist. Posterior pharynx clear of any exudate or lesions. Neck: normal, supple, no masses, no thyromegaly Respiratory: clear to auscultation bilaterally, no wheezing, no crackles. Normal respiratory effort. No accessory muscle use.  Chest: Right-sided hemodialysis catheter. Cardiovascular: Regular rate and rhythm, positive 2/6 diastolic murmur, rubs / gallops. No extremity edema. 2+ pedal pulses. No carotid bruits.  Left brachial AV fistula with good thrill. Abdomen: Soft, no tenderness, no  masses palpated. No hepatosplenomegaly. Bowel sounds positive.  Musculoskeletal: no clubbing / cyanosis.  Good ROM, no contractures. Normal muscle tone.  Skin: Large right calf ulcer with central necrosis, minimal foul-smelling discharge, with surrounding erythema/edema and tender to palpation.  Right third toe cellulitis with surrounding erythema and cellulitis.  Please see images below. Neurologic: CN 2-12 grossly intact. Sensation intact, DTR normal. Strength 5/5 in all 4.  Psychiatric: Normal judgment and  insight. Alert and oriented x 3. Normal mood.         Labs on Admission: I have personally reviewed following labs and imaging studies  CBC: Recent Labs  Lab 12/14/17 1630  WBC 12.7*  NEUTROABS 10.1*  HGB 9.7*  HCT 32.9*  MCV 96.8  PLT 973   Basic Metabolic Panel: Recent Labs  Lab 12/14/17 1630  NA 138  K 4.0  CL 98*  CO2 28  GLUCOSE 67  BUN 41*  CREATININE 4.17*  CALCIUM 9.3   GFR: Estimated Creatinine Clearance: 12.6 mL/min (A) (by C-G formula based on SCr of 4.17 mg/dL (H)). Liver Function Tests: Recent Labs  Lab 12/14/17 1630  AST 21  ALT 18  ALKPHOS 76  BILITOT 0.4  PROT 7.1  ALBUMIN 2.7*   No results for input(s): LIPASE, AMYLASE in the last 168 hours. No results for input(s): AMMONIA in the last 168 hours. Coagulation Profile: Recent Labs  Lab 12/09/17 12/09/17 1623 12/14/17 1630  INR 1.9 1.1 1.87   Cardiac Enzymes: No results for input(s): CKTOTAL, CKMB, CKMBINDEX, TROPONINI in the last 168 hours. BNP (last 3 results) No results for input(s): PROBNP in the last 8760 hours. HbA1C: No results for input(s): HGBA1C in the last 72 hours. CBG: Recent Labs  Lab 12/14/17 1837  GLUCAP 76   Lipid Profile: No results for input(s): CHOL, HDL, LDLCALC, TRIG, CHOLHDL, LDLDIRECT in the last 72 hours. Thyroid Function Tests: No results for input(s): TSH, T4TOTAL, FREET4, T3FREE, THYROIDAB in the last 72 hours. Anemia Panel: No results for  input(s): VITAMINB12, FOLATE, FERRITIN, TIBC, IRON, RETICCTPCT in the last 72 hours. Urine analysis:    Component Value Date/Time   COLORURINE AMBER (A) 11/19/2017 0300   APPEARANCEUR HAZY (A) 11/19/2017 0300   LABSPEC 1.021 11/19/2017 0300   PHURINE 5.0 11/19/2017 0300   GLUCOSEU NEGATIVE 11/19/2017 0300   HGBUR NEGATIVE 11/19/2017 0300   BILIRUBINUR NEGATIVE 11/19/2017 0300   KETONESUR NEGATIVE 11/19/2017 0300   PROTEINUR NEGATIVE 11/19/2017 0300   UROBILINOGEN 1.0 03/17/2014 1830   NITRITE NEGATIVE 11/19/2017 0300   LEUKOCYTESUR MODERATE (A) 11/19/2017 0300    Radiological Exams on Admission: Dg Chest 2 View  Result Date: 12/14/2017 CLINICAL DATA:  History of leg wound EXAM: CHEST - 2 VIEW COMPARISON:  11/03/2017 FINDINGS: Cardiac shadow is mildly prominent but likely related to the underlying dialysis state. Dialysis catheter is noted on the right and stable. The lungs are well aerated bilaterally without focal infiltrate or sizable effusion. No bony abnormality is seen. IMPRESSION: No active cardiopulmonary disease. Electronically Signed   By: Inez Catalina M.D.   On: 12/14/2017 16:19   Dg Tibia/fibula Right  Result Date: 12/14/2017 CLINICAL DATA:  Right leg ulcer EXAM: RIGHT TIBIA AND FIBULA - 2 VIEW COMPARISON:  11/02/2017 FINDINGS: No acute displaced fracture or malalignment. Fixating screw in the distal fibula with old fibular shaft fracture deformity. Soft tissue ulcer defect posterior mid lower leg. No soft tissue gas. Vascular calcifications and soft tissue calcifications. IMPRESSION: 1. No acute osseous abnormality 2. Old fracture deformity of the distal fibula 3. Small ulcer posterior soft tissues of the mid lower leg. Electronically Signed   By: Donavan Foil M.D.   On: 12/14/2017 17:08   Dg Foot Complete Right  Result Date: 12/14/2017 CLINICAL DATA:  Blister at right middle toe for 1 month EXAM: RIGHT FOOT COMPLETE - 3+ VIEW COMPARISON:  07/10/2016 FINDINGS: No acute  displaced fracture or malalignment. Moderate degenerative changes at the first MTP  joint. Vascular calcifications. Possible tiny erosions at the distal phalanges of the second and third digits. No soft tissue gas. No radiopaque foreign body. Moderate plantar calcaneal spur with plantar soft tissue calcifications. IMPRESSION: 1. No acute fracture or malalignment 2. Possible tiny erosions at the tufts of the second and third distal phalanges. If osteomyelitis is suspected, would correlate with MRI. Electronically Signed   By: Donavan Foil M.D.   On: 12/14/2017 17:11   11/03/2017 echocardiogram complete ------------------------------------------------------------------- LV EF: 55% -   60%  ------------------------------------------------------------------- Indications:      Atrial fibrillation - 427.31.  ------------------------------------------------------------------- History:   PMH:  Acquired from the patient and from the patient&'s chart.  Chronic obstructive pulmonary disease.  PMH:  Chronic diastolic heart failure Acute respiratory failure with hypoxia Renal failure (ARF), acute on chronic  Risk factors:  Mixed hyperlipidemia. Hypertension. Diabetes mellitus.  ------------------------------------------------------------------- Study Conclusions  - Left ventricle: The cavity size was normal. Wall thickness was   increased in a pattern of moderate LVH. Systolic function was   normal. The estimated ejection fraction was in the range of 55%   to 60%. Wall motion was normal; there were no regional wall   motion abnormalities. Features are consistent with a pseudonormal   left ventricular filling pattern, with concomitant abnormal   relaxation and increased filling pressure (grade 2 diastolic   dysfunction). Doppler parameters are consistent with high   ventricular filling pressure. - Aortic valve: Moderately calcified annulus. Trileaflet; mildly   thickened, moderately calcified  leaflets. Sclerosis without   stenosis. There was moderate regurgitation. - Mitral valve: Calcified annulus. Moderately calcified leaflets .   There was moderate regurgitation. - Left atrium: The atrium was severely dilated. - Right ventricle: Systolic function was moderately reduced. - Right atrium: The atrium was moderately dilated. - Tricuspid valve: Mildly thickened leaflets. There was moderate   regurgitation. - Pulmonary arteries: PA peak pressure: 63 mm Hg (S). - Inferior vena cava: The vessel was dilated. The respirophasic   diameter changes were blunted (< 50%), consistent with elevated   central venous pressure. Estimated CVP 15 mmHg.  EKG: Independently reviewed.   Assessment/Plan Principal Problem:   Cellulitis in diabetic foot ([place]) Admit to MedSurg/inpatient. Continue local care. Continue Santyl ointment daily. Consult wound care in a.m. Consult general surgery for evaluation and possible debridement. Vancomycin and Zosyn per pharmacy.  Active Problems:   Atrial fibrillation (HCC)   Atrial flutter (HCC) CHA?DS?-VASc Score of at least 6. Continue metoprolol for rate control. Warfarin per pharmacy.    Type 2 diabetes mellitus with diabetic neuropathy (HCC) Carbohydrate modified diet. Continue Levemir 7 units at bedtime. CBG monitoring with regular insulin sliding scale.    SLE (systemic lupus erythematosus) (HCC) Continue Plaquenil 300 mg p.o. daily. Continue prednisone 5 mg p.o. daily.    Essential hypertension, benign Continue metoprolol 50 mg p.o. twice daily. Monitor blood pressure and heart rate.    Mixed hyperlipidemia Continue atorvastatin 20 mg p.o. daily. Monitor LFTs as needed. Fasting lipid follow-up as an outpatient.    COPD (chronic obstructive pulmonary disease) (HCC) No signs of decompensation at this time.    Anemia of chronic renal failure, stage 4 (severe) (HCC) Monitor hematocrit and hemoglobin. Erythropoietin per nephrology.     ESRD (end stage renal disease) on dialysis [facility]) Consult nephrology for inpatient hemodialysis. Continue PhosLo 1334 mg 3 times daily. Continue U [place] 40 mg p.o. daily.    Glaucoma left eye Continue Combigan drops. Ketorolac drops will be used while  in the hospital, instead of Voltaren ophthalmic drops. Continue Travatan drops at bedtime.   DVT prophylaxis: Lovenox SQ. Code Status: Full code. Family Communication:  Disposition Plan: Admit for IV antibiotic for several days and possible wound debridement. Consults called: Routine general surgery and nephrology consults. Admission status: Inpatient/MedSurg.   Reubin Milan MD [facility] Pager [phone].  If 7PM-7AM, please contact night-coverage [email or web address] Password [facility]  12/14/2017, 9:05 PM

## 2017-12-14 NOTE — ED Notes (Signed)
Pt placed on bedpan, pt had large BM; pt cleaned and placed in a gown; Dr. Olevia Bowens is in with pt at this time

## 2017-12-14 NOTE — ED Triage Notes (Signed)
Pt sent from Dr. Delanna Ahmadi office for evaluation of right leg cellulitis and possible necrosis.

## 2017-12-14 NOTE — ED Provider Notes (Signed)
Emergency Department Provider Note   I have reviewed the triage vital signs and the nursing notes.   HISTORY  Chief Complaint Wound Check   HPI [name] is a 72 y.o. female with PMH of ESRD, CHF, COPD, DM, and a-fib presents to the emergency department for evaluation of painful ulceration to the back of the right calf and right foot.  The wound has been there since the fall 2018 but is worsening.  The patient had been following with wound care until January when she had an insurance change and stopped going.  She has had worsening pain and some foul-smelling discharge from the wound on the right leg as well as pain in the area.  Denies any numbness or tingling.  No injury.  She was seen by her home health nurse and PCP today who recommended ED evaluation given some right leg cellulitis and worsening wound.   Past Medical History:  Diagnosis Date  . Anemia   . Arthritis   . Asthma   . Atrial fibrillation ([place])   . Atrial flutter [facility])    TEE/DCCV December 2013 - on Xarelto [facility])  . Bone spur of toe    Right 5th toe  . Chronic diastolic CHF (congestive heart failure) (HCC)    a. EF 50-55% by echo in 2015  . CKD (chronic kidney disease) stage 4, GFR 15-29 ml/min (HCC)    a. s/p fistula placement but not on HD. Followed by Nephrology.   . Colon polyposis   . COPD (chronic obstructive pulmonary disease) ([place])   . DVT of upper extremity (deep vein thrombosis) ([place])    Right basilic vein, June 8242  . Essential hypertension, benign   . Fractures   . Glaucoma   . SLE (systemic lupus erythematosus) ([place])   . Type 2 diabetes mellitus ([place])   . UTI (lower urinary tract infection)     Patient Active Problem List   Diagnosis Date Noted  . Cellulitis in diabetic foot ([place]) 12/14/2017  . Atrial fibrillation ([place]) 12/14/2017  . Glaucoma 12/14/2017  . ESRD (end stage renal disease) on dialysis ([place]) 11/16/2017  . COPD with acute exacerbation ([place]) 11/03/2017  .  Community acquired pneumonia 11/02/2017  . Nausea and vomiting 11/02/2017  . Iron deficiency anemia 08/07/2017  . Heme positive stool 10/04/2015  . Mixed hyperlipidemia 06/29/2015  . Type 2 diabetes mellitus with stage 4 chronic kidney disease ([place]) 06/29/2015  . Chronic kidney disease, stage V ([place]) 04/12/2014  . 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 Encounter for therapeutic drug monitoring 03/30/2014  . Acute respiratory failure with hypoxia ([place]) 03/15/2014  . Chronic diastolic heart failure ([place]) 03/15/2014  . Anemia of chronic renal failure, stage 4 (severe) ([place]) 02/23/2014  . Folate deficiency 02/17/2014  . Aortic regurgitation 09/19/2012  . Atrial flutter ([place]) 09/14/2012  . Class 2 severe obesity due to excess calories with serious comorbidity and body mass index (BMI) of 35.0 to 35.9 in adult ([place]) 09/14/2012  . Type 2 diabetes mellitus with diabetic neuropathy ([place]) 09/14/2012  . SLE (systemic lupus erythematosus) ([place]) 09/14/2012  . Essential hypertension, benign 09/14/2012  . COPD (chronic obstructive pulmonary disease) ([place]) 09/14/2012    Past Surgical History:  Procedure Laterality Date  . ABDOMINAL HYSTERECTOMY  1983  . ANKLE SURGERY  1993  . AV FISTULA PLACEMENT Left 03/27/2014   Procedure: ARTERIOVENOUS (AV) FISTULA CREATION;  Surgeon: Rosetta Posner, MD;  Location: [facility];  Service: Vascular;  Laterality: Left;  . BACK SURGERY  1980  . CARDIOVASCULAR STRESS TEST  12/19/2009   no stress induced rhythm abnormalities, ekg negative for ischemia  . CARDIOVERSION  09/16/2012   Procedure: CARDIOVERSION;  Surgeon: Sanda Klein, MD;  Location[facility] ENDOSCOPY;  Service: Cardiovascular;  Laterality: N/A;  . COLONOSCOPY  09/20/2012   Dr. [name]: multiple tubular adenomas   . COLONOSCOPY  2005   Dr. [name]. Polyps, path unknown   . COLONOSCOPY N/A 07/24/2016   Procedure: COLONOSCOPY;  Surgeon: [name], MD;   Location[facility]O [facility];  Service: Endoscopy;  Laterality: N/A;  9:00 am  . ESOPHAGOGASTRODUODENOSCOPY  09/20/2012   Dr. [name]: duodenal erosion and possible resolving ulcer at the angularis   . IR FLUORO GUIDE CV LINE RIGHT  11/09/2017  . IR US GUIDE VASC ACCESS RIGHT  11/09/2017  . TEE WITHOUT CARDIOVERSION  09/16/2012   Procedure: TRANSESOPHAGEAL ECHOCARDIOGRAM (TEE);  Surgeon: Sanda Klein, MD;  Location: [facility] ENDOSCOPY;  Service: Cardiovascular;  Laterality: N/A;  . TRANSESOPHAGEAL ECHOCARDIOGRAM WITH CARDIOVERSION  09/16/2012   EF 60-65%, moderate LVH, moderate regurg of the aortic valve, LA moderately dilated      Allergies Ace inhibitors  Family History  Problem Relation Age of Onset  . Diabetes Brother   . Diabetes Father   . Hypertension Father   . Hypertension Sister   . Stroke Mother   . Diabetes Sister   . Hypertension Brother   . Colon cancer Neg Hx     Social History Social History   Tobacco Use  . Smoking status: Former Smoker    Packs/day: 1.00    Years: 30.00    Pack years: 30.00    Types: Cigarettes    Last attempt to quit: 08/29/2012    Years since quitting: 5.2  . Smokeless tobacco: Never Used  . Tobacco comment: quit 08-2012  Substance Use Topics  . Alcohol use: No    Alcohol/week: 0.0 oz  . Drug use: No    Review of Systems  Constitutional: No fever/chills Eyes: No visual changes. ENT: No sore throat. Cardiovascular: Denies chest pain. Respiratory: Denies shortness of breath. Gastrointestinal: No abdominal pain.  No nausea, no vomiting.  No diarrhea.  No constipation. Genitourinary: Negative for dysuria. Musculoskeletal: Negative for back pain. Skin: Leg and toe ulceration.  Neurological: Negative for headaches, focal weakness or numbness.  10-point ROS otherwise negative.  ____________________________________________   PHYSICAL EXAM:  VITAL SIGNS: ED Triage Vitals  Enc Vitals Group     BP 12/14/17 1550 138/65     Pulse Rate  12/14/17 1549 76     Resp 12/14/17 1549 16     Temp 12/14/17 1549 98.5 F (36.9 C)     Temp Source 12/14/17 1549 Oral     SpO2 12/14/17 1549 100 %     Weight 12/14/17 1550 179 lb (81.2 kg)     Height 12/14/17 1550 5\' 4"  (1.626 m)     Pain Score 12/14/17 1549 9   Constitutional: Alert and oriented. Well appearing and in no acute distress. Eyes: Conjunctivae are normal.  Head: Atraumatic. Nose: No congestion/rhinnorhea. Mouth/Throat: Mucous membranes are moist. Neck: No stridor.   Cardiovascular: Normal rate, regular rhythm. Good peripheral circulation. Grossly normal heart sounds.   Respiratory: Normal respiratory effort.  No retractions. Lungs CTAB. Gastrointestinal: Soft and nontender. No distention.  Musculoskeletal: No lower extremity tenderness nor edema. No gross deformities of extremities. Neurologic:  Normal speech and language. No gross focal neurologic deficits are appreciated.  Skin: Skin breakdown with surrounding cellulitis and dark-colored tissue over the posterior right calf. Foul smell with mild discharge. 2nd/3rd toes also with skin breakdown.   ____________________________________________   LABS (all labs ordered are listed, but only abnormal results are displayed)  Labs Reviewed  COMPREHENSIVE METABOLIC PANEL - Abnormal; Notable for the following components:      Result Value   Chloride 98 (*)    BUN 41 (*)    Creatinine, Ser 4.17 (*)    Albumin 2.7 (*)    GFR calc non Af Amer 10 (*)    GFR calc Af Amer 11 (*)    All other components within normal limits  CBC WITH DIFFERENTIAL/PLATELET - Abnormal; Notable for the following components:   WBC 12.7 (*)    RBC 3.40 (*)    Hemoglobin 9.7 (*)    HCT 32.9 (*)    MCHC 29.5 (*)    RDW 18.0 (*)    Neutro Abs 10.1 (*)    Monocytes Absolute 1.1 (*)    All other components within normal limits  PROTIME-INR - Abnormal; Notable for the following components:   Prothrombin Time 21.4 (*)    All other components  within normal limits  CULTURE, BLOOD (ROUTINE X 2)  CULTURE, BLOOD (ROUTINE X 2)  LACTIC ACID, PLASMA  URINALYSIS, ROUTINE W REFLEX MICROSCOPIC  CBC WITH DIFFERENTIAL/PLATELET  PROTIME-INR  PREALBUMIN  HEMOGLOBIN A1C  SEDIMENTATION RATE  C-REACTIVE PROTEIN  CBG MONITORING, ED   ____________________________________________  RADIOLOGY  Dg Chest 2 View  Result Date: 12/14/2017 CLINICAL DATA:  History of leg wound EXAM: CHEST - 2 VIEW COMPARISON:  11/03/2017 FINDINGS: Cardiac shadow is mildly prominent but likely related to the underlying dialysis state. Dialysis catheter is noted on the right and stable. The lungs are well aerated bilaterally without focal infiltrate or sizable effusion. No bony abnormality is seen. IMPRESSION: No active cardiopulmonary disease. Electronically Signed   By: Inez Catalina M.D.   On: 12/14/2017 16:19   Dg Tibia/fibula Right  Result Date: 12/14/2017 CLINICAL DATA:  Right leg ulcer EXAM: RIGHT TIBIA AND FIBULA - 2 VIEW COMPARISON:  11/02/2017 FINDINGS: No acute displaced fracture or malalignment. Fixating screw in the distal fibula with old fibular shaft fracture deformity. Soft tissue ulcer defect posterior mid lower leg. No soft tissue gas. Vascular calcifications and soft tissue calcifications. IMPRESSION: 1. No acute osseous abnormality 2. Old fracture deformity of the distal fibula 3. Small ulcer posterior soft tissues of the mid lower leg. Electronically Signed   By: Donavan Foil M.D.   On: 12/14/2017 17:08   Dg Foot Complete Right  Result Date: 12/14/2017 CLINICAL DATA:  Blister at right middle toe for 1 month EXAM: RIGHT FOOT COMPLETE - 3+ VIEW COMPARISON:  07/10/2016 FINDINGS: No acute displaced fracture or malalignment. Moderate degenerative changes at the first MTP joint. Vascular calcifications. Possible tiny erosions at the distal phalanges of the second and third digits. No soft tissue gas. No radiopaque foreign body. Moderate plantar calcaneal spur  with plantar soft tissue calcifications. IMPRESSION: 1. No acute fracture or malalignment 2. Possible tiny erosions at the tufts of the second and third distal phalanges. If osteomyelitis is suspected, would correlate with MRI. Electronically Signed   By: Donavan Foil M.D.   On: 12/14/2017 17:11    ____________________________________________   PROCEDURES  Procedure(s) performed:   Procedures  None ____________________________________________   INITIAL IMPRESSION / ASSESSMENT AND PLAN / ED COURSE  Pertinent labs & imaging results that were available  during my care of the patient were reviewed by me and considered in my medical decision making (see chart for details).  Patient presents to the emergency department for evaluation of worsening wound to the right leg.  There is some surrounding erythema.  There is also a wound of the right third toe.  I am able to Doppler the pulses in the feet.  There is normal range of motion of the ankle and toes.  No sensory deficit.  Plain films show possible osteomyelitis in the toe with no bony abnormality in the right leg.  Plan to start vancomycin with some surrounding cellulitis and leukocytosis on labs.  Remaining labs reviewed with no acute findings.   Plain films reviewed along with labs. Mild leukocytosis with possible toe osteo. Plan to start Vanc with right calf cellulitis. Will need wound evaluation and debridement.   Discussed patient's case with Hospitalist, Dr. Olevia Bowens to request admission. Patient and family (if present) updated with plan. Care transferred to Hospitalist service.  I reviewed all nursing notes, vitals, pertinent old records, EKGs, labs, imaging (as available).  ____________________________________________  FINAL CLINICAL IMPRESSION(S) / ED DIAGNOSES  Final diagnoses:  Cellulitis of right lower extremity  Skin ulcer of right calf with necrosis of muscle ([place])     MEDICATIONS GIVEN DURING THIS VISIT:  Medications    fentaNYL (SUBLIMAZE) injection 50 mcg (50 mcg Intravenous Given 12/14/17 1903)  piperacillin-tazobactam (ZOSYN) IVPB 3.375 g (0 g Intravenous Stopped 12/14/17 2137)  ondansetron (ZOFRAN) tablet 4 mg (not administered)    Or  ondansetron (ZOFRAN) injection 4 mg (not administered)  oxyCODONE (Oxy IR/ROXICODONE) immediate release tablet 5 mg (not administered)  acetaminophen (TYLENOL) tablet 650 mg (not administered)    Or  acetaminophen (TYLENOL) suppository 650 mg (not administered)  insulin aspart (novoLOG) injection 0-9 Units (not administered)  warfarin (COUMADIN) tablet 5 mg (not administered)  Warfarin - Pharmacist Dosing Inpatient (not administered)  ipratropium-albuterol (DUONEB) 0.5-2.5 (3) MG/3ML nebulizer solution 3 mL (not administered)  heparin injection 5,000 Units (not administered)  vancomycin (VANCOCIN) 1,500 mg in sodium chloride 0.9 % 500 mL IVPB (0 mg Intravenous Stopped 12/14/17 2255)  ondansetron (ZOFRAN) injection 4 mg (4 mg Intravenous Given 12/14/17 2013)    Note:  This document was prepared using Dragon voice recognition software and may include unintentional dictation errors.  Nanda Quinton, MD Emergency Medicine    Long, [facility], MD 12/14/17 2330

## 2017-12-15 ENCOUNTER — Inpatient Hospital Stay (HOSPITAL_COMMUNITY): Payer: Medicare Other

## 2017-12-15 ENCOUNTER — Other Ambulatory Visit: Payer: Self-pay

## 2017-12-15 DIAGNOSIS — L97519 Non-pressure chronic ulcer of other part of right foot with unspecified severity: Secondary | ICD-10-CM

## 2017-12-15 DIAGNOSIS — L97529 Non-pressure chronic ulcer of other part of left foot with unspecified severity: Secondary | ICD-10-CM

## 2017-12-15 LAB — GLUCOSE, CAPILLARY
GLUCOSE-CAPILLARY: 118 mg/dL — AB (ref 65–99)
GLUCOSE-CAPILLARY: 63 mg/dL — AB (ref 65–99)
Glucose-Capillary: 173 mg/dL — ABNORMAL HIGH (ref 65–99)
Glucose-Capillary: 174 mg/dL — ABNORMAL HIGH (ref 65–99)
Glucose-Capillary: 177 mg/dL — ABNORMAL HIGH (ref 65–99)
Glucose-Capillary: 201 mg/dL — ABNORMAL HIGH (ref 65–99)
Glucose-Capillary: 46 mg/dL — ABNORMAL LOW (ref 65–99)

## 2017-12-15 LAB — CBC WITH DIFFERENTIAL/PLATELET
BASOS ABS: 0 10*3/uL (ref 0.0–0.1)
Basophils Relative: 0 %
Eosinophils Absolute: 0 10*3/uL (ref 0.0–0.7)
Eosinophils Relative: 1 %
HEMATOCRIT: 29 % — AB (ref 36.0–46.0)
HEMOGLOBIN: 8.6 g/dL — AB (ref 12.0–15.0)
Lymphocytes Relative: 14 %
Lymphs Abs: 1.2 10*3/uL (ref 0.7–4.0)
MCH: 28.5 pg (ref 26.0–34.0)
MCHC: 29.7 g/dL — ABNORMAL LOW (ref 30.0–36.0)
MCV: 96 fL (ref 78.0–100.0)
Monocytes Absolute: 1 10*3/uL (ref 0.1–1.0)
Monocytes Relative: 12 %
NEUTROS ABS: 6.4 10*3/uL (ref 1.7–7.7)
NEUTROS PCT: 73 %
Platelets: 228 10*3/uL (ref 150–400)
RBC: 3.02 MIL/uL — AB (ref 3.87–5.11)
RDW: 17.7 % — ABNORMAL HIGH (ref 11.5–15.5)
WBC: 8.7 10*3/uL (ref 4.0–10.5)

## 2017-12-15 LAB — RENAL FUNCTION PANEL
ANION GAP: 10 (ref 5–15)
Albumin: 2.3 g/dL — ABNORMAL LOW (ref 3.5–5.0)
BUN: 47 mg/dL — ABNORMAL HIGH (ref 6–20)
CALCIUM: 9.1 mg/dL (ref 8.9–10.3)
CHLORIDE: 102 mmol/L (ref 101–111)
CO2: 24 mmol/L (ref 22–32)
CREATININE: 3.92 mg/dL — AB (ref 0.44–1.00)
GFR, EST AFRICAN AMERICAN: 12 mL/min — AB (ref >60)
GFR, EST NON AFRICAN AMERICAN: 11 mL/min — AB (ref >60)
Glucose, Bld: 208 mg/dL — ABNORMAL HIGH (ref 65–99)
Phosphorus: 2.2 mg/dL — ABNORMAL LOW (ref 2.5–4.6)
Potassium: 4.4 mmol/L (ref 3.5–5.1)
Sodium: 136 mmol/L (ref 135–145)

## 2017-12-15 LAB — CBC
HCT: 29.8 % — ABNORMAL LOW (ref 36.0–46.0)
HEMOGLOBIN: 9 g/dL — AB (ref 12.0–15.0)
MCH: 28.9 pg (ref 26.0–34.0)
MCHC: 30.2 g/dL (ref 30.0–36.0)
MCV: 95.8 fL (ref 78.0–100.0)
PLATELETS: 219 10*3/uL (ref 150–400)
RBC: 3.11 MIL/uL — AB (ref 3.87–5.11)
RDW: 17.7 % — ABNORMAL HIGH (ref 11.5–15.5)
WBC: 9.5 10*3/uL (ref 4.0–10.5)

## 2017-12-15 LAB — HEMOGLOBIN A1C
Hgb A1c MFr Bld: 5.8 % — ABNORMAL HIGH (ref 4.8–5.6)
MEAN PLASMA GLUCOSE: 119.76 mg/dL

## 2017-12-15 LAB — PROTIME-INR
INR: 1.74
Prothrombin Time: 20.2 seconds — ABNORMAL HIGH (ref 11.4–15.2)

## 2017-12-15 LAB — PREALBUMIN: PREALBUMIN: 25.5 mg/dL (ref 18–38)

## 2017-12-15 LAB — MRSA PCR SCREENING: MRSA by PCR: NEGATIVE

## 2017-12-15 LAB — C-REACTIVE PROTEIN: CRP: 2.1 mg/dL — AB (ref <1.0)

## 2017-12-15 MED ORDER — NEPRO/CARBSTEADY PO LIQD
237.0000 mL | ORAL | Status: DC
  Administered 2017-12-15 – 2017-12-28 (×9): 237 mL via ORAL
  Filled 2017-12-15 (×8): qty 237

## 2017-12-15 MED ORDER — LIDOCAINE-PRILOCAINE 2.5-2.5 % EX CREA: 1.0000 "application " | TOPICAL_CREAM | CUTANEOUS | Status: DC | PRN

## 2017-12-15 MED ORDER — VANCOMYCIN HCL IN DEXTROSE 1-5 GM/200ML-% IV SOLN
1000.0000 mg | INTRAVENOUS | Status: DC
  Administered 2017-12-17 – 2017-12-19 (×2): 1000 mg via INTRAVENOUS
  Filled 2017-12-15 (×3): qty 200

## 2017-12-15 MED ORDER — TIMOLOL MALEATE 0.5 % OP SOLN
1.0000 [drp] | Freq: Every day | OPHTHALMIC | Status: DC
  Administered 2017-12-15 – 2017-12-29 (×13): 1 [drp] via OPHTHALMIC
  Filled 2017-12-15 (×4): qty 5

## 2017-12-15 MED ORDER — METOPROLOL TARTRATE 25 MG PO TABS
25.0000 mg | ORAL_TABLET | ORAL | Status: DC
  Administered 2017-12-15 – 2017-12-26 (×6): 25 mg via ORAL
  Filled 2017-12-15 (×9): qty 1

## 2017-12-15 MED ORDER — EPOETIN ALFA 10000 UNIT/ML IJ SOLN
INTRAMUSCULAR | Status: AC
  Filled 2017-12-15: qty 1

## 2017-12-15 MED ORDER — HEPARIN SODIUM (PORCINE) 1000 UNIT/ML DIALYSIS: 1000.0000 [IU] | INTRAMUSCULAR | Status: DC | PRN

## 2017-12-15 MED ORDER — ALTEPLASE 2 MG IJ SOLR: 2.0000 mg | Freq: Once | INTRAMUSCULAR | Status: DC | PRN

## 2017-12-15 MED ORDER — EPOETIN ALFA 10000 UNIT/ML IJ SOLN
10000.0000 [IU] | INTRAMUSCULAR | Status: DC
  Filled 2017-12-15: qty 1

## 2017-12-15 MED ORDER — DARBEPOETIN ALFA 100 MCG/0.5ML IJ SOSY
100.0000 ug | PREFILLED_SYRINGE | INTRAMUSCULAR | Status: DC
  Administered 2017-12-16: 100 ug via INTRAVENOUS
  Filled 2017-12-15: qty 0.5

## 2017-12-15 MED ORDER — CALCITRIOL 0.25 MCG PO CAPS
0.2500 ug | ORAL_CAPSULE | ORAL | Status: DC
  Administered 2017-12-16 – 2017-12-19 (×3): 0.25 ug via ORAL
  Filled 2017-12-15 (×3): qty 1

## 2017-12-15 MED ORDER — VANCOMYCIN HCL IN DEXTROSE 1-5 GM/200ML-% IV SOLN
1000.0000 mg | INTRAVENOUS | Status: DC | PRN
  Filled 2017-12-15: qty 200

## 2017-12-15 MED ORDER — SODIUM CHLORIDE 0.9 % IV SOLN: 100.0000 mL | INTRAVENOUS | Status: DC | PRN

## 2017-12-15 MED ORDER — JUVEN PO PACK
1.0000 | PACK | Freq: Two times a day (BID) | ORAL | Status: DC
  Administered 2017-12-15 – 2017-12-29 (×21): 1 via ORAL
  Filled 2017-12-15 (×31): qty 1

## 2017-12-15 MED ORDER — LIDOCAINE HCL (PF) 1 % IJ SOLN: 5.0000 mL | INTRAMUSCULAR | Status: DC | PRN

## 2017-12-15 MED ORDER — WARFARIN SODIUM 5 MG PO TABS
5.0000 mg | ORAL_TABLET | Freq: Once | ORAL | Status: AC
  Administered 2017-12-15: 5 mg via ORAL
  Filled 2017-12-15: qty 1

## 2017-12-15 MED ORDER — PENTAFLUOROPROP-TETRAFLUOROETH EX AERO: 1.0000 "application " | INHALATION_SPRAY | CUTANEOUS | Status: DC | PRN

## 2017-12-15 MED ORDER — HEPARIN SODIUM (PORCINE) 1000 UNIT/ML IJ SOLN
INTRAMUSCULAR | Status: AC
  Filled 2017-12-15: qty 7

## 2017-12-15 MED ORDER — METOPROLOL TARTRATE 25 MG PO TABS: 25.0000 mg | ORAL_TABLET | Freq: Two times a day (BID) | ORAL | Status: DC

## 2017-12-15 MED ORDER — DOXERCALCIFEROL 4 MCG/2ML IV SOLN
7.0000 ug | INTRAVENOUS | Status: DC
  Filled 2017-12-15: qty 4

## 2017-12-15 MED ORDER — LATANOPROST 0.005 % OP SOLN
OPHTHALMIC | Status: AC
  Filled 2017-12-15: qty 2.5

## 2017-12-15 MED ORDER — DARBEPOETIN ALFA 100 MCG/0.5ML IJ SOSY
100.0000 ug | PREFILLED_SYRINGE | INTRAMUSCULAR | Status: DC
  Filled 2017-12-15: qty 0.5

## 2017-12-15 MED ORDER — BRIMONIDINE TARTRATE 0.2 % OP SOLN
1.0000 [drp] | Freq: Every day | OPHTHALMIC | Status: DC
  Administered 2017-12-15 – 2017-12-29 (×12): 1 [drp] via OPHTHALMIC
  Filled 2017-12-15 (×4): qty 5

## 2017-12-15 NOTE — Consult Note (Signed)
Reason for Consult: End-stage renal disease Referring Physician: Dr. Francetta Found is an 72 y.o. female.  HPI: She is a patient who was history of COPD, hypertension, lupus, diabetes, end-stage renal disease on maintenance hemodialysis presently sent from her primary care physician because of worsening of ulcer of her foot.  Patient had chronic diabetic foot ulcer on her right leg which seems to be getting worse.  Presently patient denies any fever chills or sweating.  This has been going on for some time.  Presently consult is called for dialysis.  Past Medical History:  Diagnosis Date  . Anemia   . Arthritis   . Asthma   . Atrial fibrillation ([place])   . Atrial flutter [facility])    TEE/DCCV December 2013 - on Xarelto [facility])  . Bone spur of toe    Right 5th toe  . Chronic diastolic CHF (congestive heart failure) (HCC)    a. EF 50-55% by echo in 2015  . CKD (chronic kidney disease) stage 4, GFR 15-29 ml/min (HCC)    a. s/p fistula placement but not on HD. Followed by Nephrology.   . Colon polyposis   . COPD (chronic obstructive pulmonary disease) ([place])   . DVT of upper extremity (deep vein thrombosis) ([place])    Right basilic vein, June 1194  . Essential hypertension, benign   . Fractures   . Glaucoma   . SLE (systemic lupus erythematosus) ([place])   . Type 2 diabetes mellitus ([place])   . UTI (lower urinary tract infection)     Past Surgical History:  Procedure Laterality Date  . ABDOMINAL HYSTERECTOMY  1983  . ANKLE SURGERY  1993  . AV FISTULA PLACEMENT Left 03/27/2014   Procedure: ARTERIOVENOUS (AV) FISTULA CREATION;  Surgeon: Rosetta Posner, MD;  Location: [facility];  Service: Vascular;  Laterality: Left;  . BACK SURGERY  1980  . CARDIOVASCULAR STRESS TEST  12/19/2009   no stress induced rhythm abnormalities, ekg negative for ischemia  . CARDIOVERSION  09/16/2012   Procedure: CARDIOVERSION;  Surgeon: Sanda Klein, MD;  Location: [facility] ENDOSCOPY;  Service: Cardiovascular;   Laterality: N/A;  . COLONOSCOPY  09/20/2012   Dr. [name]: multiple tubular adenomas   . COLONOSCOPY  2005   Dr. [name]. Polyps, path unknown   . COLONOSCOPY N/A 07/24/2016   Procedure: COLONOSCOPY;  Surgeon: [name], MD;  [facility];  Service: Endoscopy;  Laterality: N/A;  9:00 am  . ESOPHAGOGASTRODUODENOSCOPY  09/20/2012   Dr. [name]: duodenal erosion and possible resolving ulcer at the angularis   . IR FLUORO GUIDE CV LINE RIGHT  11/09/2017  . IR US GUIDE VASC ACCESS RIGHT  11/09/2017  . TEE WITHOUT CARDIOVERSION  09/16/2012   Procedure: TRANSESOPHAGEAL ECHOCARDIOGRAM (TEE);  Surgeon: Sanda Klein, MD;  Location: [facility] ENDOSCOPY;  Service: Cardiovascular;  Laterality: N/A;  . TRANSESOPHAGEAL ECHOCARDIOGRAM WITH CARDIOVERSION  09/16/2012   EF 60-65%, moderate LVH, moderate regurg of the aortic valve, LA moderately dilated    Family History  Problem Relation Age of Onset  . Diabetes Brother   . Diabetes Father   . Hypertension Father   . Hypertension Sister   . Stroke Mother   . Diabetes Sister   . Hypertension Brother   . Colon cancer Neg Hx     Social History:  reports that she quit smoking about 5 years ago. Her smoking use included cigarettes. She has a 30.00 pack-year smoking history. she has never used smokeless tobacco. She reports that she  does not drink alcohol or use drugs.  Allergies:  Allergies  Allergen Reactions  . Ace Inhibitors Cough    Medications: I have reviewed the patient's current medications.  Results for orders placed or performed during the hospital encounter of 12/14/17 (from the past 48 hour(s))  Comprehensive metabolic panel     Status: Abnormal   Collection Time: 12/14/17  4:30 PM  Result Value Ref Range   Sodium 138 135 - 145 mmol/L   Potassium 4.0 3.5 - 5.1 mmol/L   Chloride 98 (L) 101 - 111 mmol/L   CO2 28 22 - 32 mmol/L   Glucose, Bld 67 65 - 99 mg/dL   BUN 41 (H) 6 - 20 mg/dL   Creatinine, Ser 4.17 (H) 0.44 - 1.00  mg/dL   Calcium 9.3 8.9 - 10.3 mg/dL   Total Protein 7.1 6.5 - 8.1 g/dL   Albumin 2.7 (L) 3.5 - 5.0 g/dL   AST 21 15 - 41 U/L   ALT 18 14 - 54 U/L   Alkaline Phosphatase 76 38 - 126 U/L   Total Bilirubin 0.4 0.3 - 1.2 mg/dL   GFR calc non Af Amer 10 (L) >60 mL/min   GFR calc Af Amer 11 (L) >60 mL/min    Comment: (NOTE) The eGFR has been calculated using the CKD EPI equation. This calculation has not been validated in all clinical situations. eGFR's persistently <60 mL/min signify possible Chronic Kidney Disease.    Anion gap 12 5 - 15    Comment: Performed at [facility], [address] [place], [place] [address]  CBC with Differential     Status: Abnormal   Collection Time: 12/14/17  4:30 PM  Result Value Ref Range   WBC 12.7 (H) 4.0 - 10.5 K/uL   RBC 3.40 (L) 3.87 - 5.11 MIL/uL   Hemoglobin 9.7 (L) 12.0 - 15.0 g/dL   HCT 32.9 (L) 36.0 - 46.0 %   MCV 96.8 78.0 - 100.0 fL   MCH 28.5 26.0 - 34.0 pg   MCHC 29.5 (L) 30.0 - 36.0 g/dL   RDW 18.0 (H) 11.5 - 15.5 %   Platelets 266 150 - 400 K/uL   Neutrophils Relative % 80 %   Neutro Abs 10.1 (H) 1.7 - 7.7 K/uL   Lymphocytes Relative 12 %   Lymphs Abs 1.5 0.7 - 4.0 K/uL   Monocytes Relative 8 %   Monocytes Absolute 1.1 (H) 0.1 - 1.0 K/uL   Eosinophils Relative 0 %   Eosinophils Absolute 0.0 0.0 - 0.7 K/uL   Basophils Relative 0 %   Basophils Absolute 0.0 0.0 - 0.1 K/uL    Comment: Performed at [facility], [address] [place], [place] [address]  Protime-INR     Status: Abnormal   Collection Time: 12/14/17  4:30 PM  Result Value Ref Range   Prothrombin Time 21.4 (H) 11.4 - 15.2 seconds   INR 1.87     Comment: Performed at [facility], [address], [place], [place] [address]  Culture, blood (Routine x 2)     Status: None (Preliminary result)   Collection Time: 12/14/17  4:30 PM  Result Value Ref Range   Specimen Description RIGHT ANTECUBITAL    Special Requests      BOTTLES DRAWN AEROBIC AND ANAEROBIC Blood Culture  adequate volume   Culture      NO GROWTH < 24 HOURS Performed at [facility], [address], [place], [place] [address]    Report Status PENDING  Culture, blood (Routine x 2)     Status: None (Preliminary result)   Collection Time: 12/14/17  4:31 PM  Result Value Ref Range   Specimen Description BLOOD RIGHT HAND    Special Requests      BOTTLES DRAWN AEROBIC AND ANAEROBIC Blood Culture adequate volume   Culture      NO GROWTH < 24 HOURS Performed at [facility], [address] [place], [place] [address]    Report Status PENDING   Lactic acid, plasma     Status: None   Collection Time: 12/14/17  4:31 PM  Result Value Ref Range   Lactic Acid, Venous 1.2 0.5 - 1.9 mmol/L    Comment: Performed at [facility], [address], [place], [place] [address]  CBG monitoring, ED     Status: None   Collection Time: 12/14/17  6:37 PM  Result Value Ref Range   Glucose-Capillary 76 65 - 99 mg/dL  Sedimentation rate     Status: Abnormal   Collection Time: 12/14/17  9:45 PM  Result Value Ref Range   Sed Rate 80 (H) 0 - 22 mm/hr    Comment: Performed at [facility], [address], [place], [place] [address]  Glucose, capillary     Status: Abnormal   Collection Time: 12/15/17 12:05 AM  Result Value Ref Range   Glucose-Capillary 46 (L) 65 - 99 mg/dL  Glucose, capillary     Status: Abnormal   Collection Time: 12/15/17 12:06 AM  Result Value Ref Range   Glucose-Capillary 44 (LL) 65 - 99 mg/dL   Comment 1 Notify RN   Glucose, capillary     Status: Abnormal   Collection Time: 12/15/17 12:39 AM  Result Value Ref Range   Glucose-Capillary 63 (L) 65 - 99 mg/dL   Comment 1 Notify RN   CBC WITH DIFFERENTIAL     Status: Abnormal   Collection Time: 12/15/17  5:18 AM  Result Value Ref Range   WBC 8.7 4.0 - 10.5 K/uL   RBC 3.02 (L) 3.87 - 5.11 MIL/uL   Hemoglobin 8.6 (L) 12.0 - 15.0 g/dL   HCT 29.0 (L) 36.0 - 46.0 %   MCV 96.0 78.0 - 100.0 fL   MCH 28.5 26.0 - 34.0 pg   MCHC 29.7 (L) 30.0 -  36.0 g/dL   RDW 17.7 (H) 11.5 - 15.5 %   Platelets 228 150 - 400 K/uL   Neutrophils Relative % 73 %   Neutro Abs 6.4 1.7 - 7.7 K/uL   Lymphocytes Relative 14 %   Lymphs Abs 1.2 0.7 - 4.0 K/uL   Monocytes Relative 12 %   Monocytes Absolute 1.0 0.1 - 1.0 K/uL   Eosinophils Relative 1 %   Eosinophils Absolute 0.0 0.0 - 0.7 K/uL   Basophils Relative 0 %   Basophils Absolute 0.0 0.0 - 0.1 K/uL    Comment: Performed at [facility], [address] [place], [place] [address]  Protime-INR     Status: Abnormal   Collection Time: 12/15/17  5:18 AM  Result Value Ref Range   Prothrombin Time 20.2 (H) 11.4 - 15.2 seconds   INR 1.74     Comment: Performed at [facility] [address] [place], [place] [address]  Glucose, capillary     Status: Abnormal   Collection Time: 12/15/17  8:10 AM  Result Value Ref Range   Glucose-Capillary 118 (H) 65 - 99 mg/dL   Comment 1 Notify RN    Comment 2 Document in  Chart     Dg Chest 2 View  Result Date: 12/14/2017 CLINICAL DATA:  History of leg wound EXAM: CHEST - 2 VIEW COMPARISON:  11/03/2017 FINDINGS: Cardiac shadow is mildly prominent but likely related to the underlying dialysis state. Dialysis catheter is noted on the right and stable. The lungs are well aerated bilaterally without focal infiltrate or sizable effusion. No bony abnormality is seen. IMPRESSION: No active cardiopulmonary disease. Electronically Signed   By: Inez Catalina M.D.   On: 12/14/2017 16:19   Dg Tibia/fibula Right  Result Date: 12/14/2017 CLINICAL DATA:  Right leg ulcer EXAM: RIGHT TIBIA AND FIBULA - 2 VIEW COMPARISON:  11/02/2017 FINDINGS: No acute displaced fracture or malalignment. Fixating screw in the distal fibula with old fibular shaft fracture deformity. Soft tissue ulcer defect posterior mid lower leg. No soft tissue gas. Vascular calcifications and soft tissue calcifications. IMPRESSION: 1. No acute osseous abnormality 2. Old fracture deformity of the distal fibula 3. Small  ulcer posterior soft tissues of the mid lower leg. Electronically Signed   By: Donavan Foil M.D.   On: 12/14/2017 17:08   Mr Foot Right Wo Contrast  Result Date: 12/15/2017 CLINICAL DATA:  Skin ulcerations since the fall of 2018 on the right second and third toes in a diabetic patient with end-stage renal disease. EXAM: MRI OF THE RIGHT FOREFOOT WITHOUT CONTRAST TECHNIQUE: Multiplanar, multisequence MR imaging of the right forefoot was performed. No intravenous contrast was administered. COMPARISON:  Plain films right foot 12/14/2017. FINDINGS: This exam is degraded by patient motion. Bones/Joint/Cartilage No bone marrow edema to suggest osteomyelitis is identified. No fracture is seen. There is some degenerative change about the first MTP joint. Ligaments Intact. Muscles and Tendons Mild intrasubstance increased T2 signal in intrinsic musculature is most consistent with diabetic myopathy. No intramuscular fluid collection. Soft tissues Negative. The patient's skin ulcerations are not visible likely due to motion. IMPRESSION: Negative for abscess or osteomyelitis. Electronically Signed   By: Inge Rise M.D.   On: 12/15/2017 08:36   Dg Foot Complete Right  Result Date: 12/14/2017 CLINICAL DATA:  Blister at right middle toe for 1 month EXAM: RIGHT FOOT COMPLETE - 3+ VIEW COMPARISON:  07/10/2016 FINDINGS: No acute displaced fracture or malalignment. Moderate degenerative changes at the first MTP joint. Vascular calcifications. Possible tiny erosions at the distal phalanges of the second and third digits. No soft tissue gas. No radiopaque foreign body. Moderate plantar calcaneal spur with plantar soft tissue calcifications. IMPRESSION: 1. No acute fracture or malalignment 2. Possible tiny erosions at the tufts of the second and third distal phalanges. If osteomyelitis is suspected, would correlate with MRI. Electronically Signed   By: Donavan Foil M.D.   On: 12/14/2017 17:11    Review of Systems   Constitutional: Negative for chills and fever.  Respiratory: Negative for cough and shortness of breath.   Cardiovascular: Positive for leg swelling. Negative for orthopnea.  Gastrointestinal: Negative for nausea and vomiting.  Neurological: Positive for weakness. Negative for dizziness.   Blood pressure (!) 179/45, pulse 82, temperature 98 F (36.7 C), temperature source Oral, resp. rate 18, height '5\' 5"'  (1.651 m), weight 81.2 kg (179 lb), SpO2 99 %. Physical Exam  Constitutional: No distress.  Neck: No JVD present.  Cardiovascular:  irigular rate and rythm  Respiratory: She has no wheezes. She has no rales.  GI: She exhibits no distension. There is no tenderness.  Musculoskeletal: She exhibits no edema.    Assessment/Plan: 1] diabetic foot ulcer: Presently  she is on antibiotics.  Patient is a febrile and her white blood cell count is improving. 2] end-stage renal disease: She is status post hemodialysis on Saturday.  Her potassium is normal.  Patient is due for dialysis today. 3] anemia: Her hemoglobin is below target goal 4] history of hypertension: Blood pressure well controlled 5] history of atrial fibrillation 6] history of diabetes 7] history of COPD: Presently denies any difficulty breathing. Plan: We will make arrangement for patient to get dialysis today for 4 hours 2] we will hold heparin 3] we will try to remove about 2-3/3 L if systolic blood pressure remains above 90 5] will use Epogen 10,000 units IV after each dialysis 5] we will check her CBC renal panel in the morning.  Crixus Mcaulay S 12/15/2017, 9:38 AM

## 2017-12-15 NOTE — Consult Note (Addendum)
[facility] Renal Consultation Note    Indication for Consultation:  Management of ESRD/hemodialysis; anemia, hypertension/volume and secondary hyperparathyroidism PCP: Sharilyn Sites, MD  HPI: [name] is a 72 y.o. female with ESRD on TTS dialysis in [employer] who was admitted to [facility] from her PCP's office with worsening chronic right diabetic foot ulcer in her calf.  PMHx is significant for a history of COPD, afib, SLE, HTN, right upper extremity DVT . She has had LE wounds for several months and stopped going for care since January 2019 due to change in heath insurance and wounds have worsened.   Work up at [facility] showed MRI neg for osteo or abscess.  She had failed outpatient doxycycline. CXR yesterday showed NAD.  K 4 . He last HD was 3/16. She was consulted for dialysis at [facility] but transferred to [facility] for additional care   She has been ambulatory but has reduced walking due to pain in leg.  Appetite is poor and she knows she is losing weight.  She denies SOB, CP, N, V, D, fever or chills.  She does have right leg pain. She has know PAD with right fem artery occlusive disease.  She uses a right TDC and has a left upper AVF but is unclear about it's status..  Past Medical History:  Diagnosis Date  . Anemia   . Arthritis   . Asthma   . Atrial fibrillation ([place])   . Atrial flutter [facility])    TEE/DCCV December 2013 - on Xarelto [facility])  . Bone spur of toe    Right 5th toe  . Chronic diastolic CHF (congestive heart failure) (HCC)    a. EF 50-55% by echo in 2015  . CKD (chronic kidney disease) stage 4, GFR 15-29 ml/min (HCC)    a. s/p fistula placement but not on HD. Followed by Nephrology.   . Colon polyposis   . COPD (chronic obstructive pulmonary disease) ([place])   . DVT of upper extremity (deep vein thrombosis) ([place])    Right basilic vein, June 8786  . Essential hypertension, benign   . Fractures   . Glaucoma   . SLE (systemic lupus  erythematosus) ([place])   . Type 2 diabetes mellitus ([place])   . UTI (lower urinary tract infection)    Past Surgical History:  Procedure Laterality Date  . ABDOMINAL HYSTERECTOMY  1983  . ANKLE SURGERY  1993  . AV FISTULA PLACEMENT Left 03/27/2014   Procedure: ARTERIOVENOUS (AV) FISTULA CREATION;  Surgeon: Rosetta Posner, MD;  Location: [facility];  Service: Vascular;  Laterality: Left;  . BACK SURGERY  1980  . CARDIOVASCULAR STRESS TEST  12/19/2009   no stress induced rhythm abnormalities, ekg negative for ischemia  . CARDIOVERSION  09/16/2012   Procedure: CARDIOVERSION;  Surgeon: Sanda Klein, MD;  Location: [facility] ENDOSCOPY;  Service: Cardiovascular;  Laterality: N/A;  . COLONOSCOPY  09/20/2012   Dr. [name]: multiple tubular adenomas   . COLONOSCOPY  2005   Dr. [name]. Polyps, path unknown   . COLONOSCOPY N/A 07/24/2016   Procedure: COLONOSCOPY;  Surgeon: [name], MD;  [facility];  Service: Endoscopy;  Laterality: N/A;  9:00 am  . ESOPHAGOGASTRODUODENOSCOPY  09/20/2012   Dr. [name]: duodenal erosion and possible resolving ulcer at the angularis   . IR FLUORO GUIDE CV LINE RIGHT  11/09/2017  . IR US GUIDE VASC ACCESS RIGHT  11/09/2017  . TEE WITHOUT CARDIOVERSION  09/16/2012   Procedure: TRANSESOPHAGEAL ECHOCARDIOGRAM (  TEE);  Surgeon: Sanda Klein, MD;  Location: [facility] ENDOSCOPY;  Service: Cardiovascular;  Laterality: N/A;  . TRANSESOPHAGEAL ECHOCARDIOGRAM WITH CARDIOVERSION  09/16/2012   EF 60-65%, moderate LVH, moderate regurg of the aortic valve, LA moderately dilated   Family History  Problem Relation Age of Onset  . Diabetes Brother   . Diabetes Father   . Hypertension Father   . Hypertension Sister   . Stroke Mother   . Diabetes Sister   . Hypertension Brother   . Colon cancer Neg Hx    Social History:  reports that she quit smoking about 5 years ago. Her smoking use included cigarettes. She has a 30.00 pack-year smoking history. she has never used smokeless  tobacco. She reports that she does not drink alcohol or use drugs. Allergies  Allergen Reactions  . Ace Inhibitors Cough   Prior to Admission medications   Medication Sig Start Date End Date Taking? Authorizing Provider  acetaminophen (TYLENOL) 500 MG tablet Take 1,000 mg by mouth every 6 (six) hours as needed. For pain   Yes [provider]  albuterol (ACCUNEB) 0.63 MG/3ML nebulizer solution Take 1 ampule by nebulization every 6 (six) hours as needed for wheezing or shortness of breath.    Yes [provider]  albuterol (PROVENTIL HFA;VENTOLIN HFA) 108 (90 BASE) MCG/ACT inhaler Inhale 2 puffs into the lungs every 6 (six) hours as needed. For shortness of breath   Yes [provider]  atorvastatin (LIPITOR) 20 MG tablet Take 20 mg by mouth daily.   Yes [provider]  brimonidine-timolol (COMBIGAN) 0.2-0.5 % ophthalmic solution Place 1 drop into the left eye daily.    Yes [provider]  calcium acetate (PHOSLO) 667 MG capsule Take 2 capsules (1,334 mg total) by mouth 3 (three) times daily with meals. 11/13/17  Yes Shah, Pratik D, DO  collagenase (SANTYL) ointment Apply 1 application topically daily.   Yes [provider]  diclofenac (VOLTAREN) 0.1 % ophthalmic solution Place 1 drop into the left eye daily. Uses at lunch time   Yes [provider]  dicyclomine (BENTYL) 10 MG capsule Take 10 mg by mouth 4 (four) times daily -  before meals and at bedtime.   Yes [provider]  febuxostat (ULORIC) 40 MG tablet Take 40 mg by mouth daily.   Yes [provider]  folic acid (FOLVITE) 1 MG tablet Take 1 mg by mouth daily.   Yes [provider]  HYDROcodone-acetaminophen (NORCO/VICODIN) 5-325 MG tablet Take 1-2 tablets by mouth every 8 (eight) hours as needed for moderate pain. 11/25/17  Yes Lassen, Arlo C, PA-C  hydroxychloroquine (PLAQUENIL) 200 MG tablet Take 300 mg by mouth as directed. Take 1 1/2 tablets daily    Yes [provider]  Insulin Detemir (LEVEMIR FLEXTOUCH) 100 UNIT/ML Pen Inject 7 Units into the skin daily at 10 pm.   Yes [provider]  insulin lispro (HUMALOG KWIKPEN) 100 UNIT/ML KiwkPen Give per sliding scale from 2-10 units subcutaneous three times a day.   Yes [provider]  iron polysaccharides (NIFEREX) 150 MG capsule Take 150 mg by mouth 3 (three) times daily.    Yes [provider]  metoprolol tartrate (LOPRESSOR) 50 MG tablet Take 50 mg by mouth 2 (two) times daily. Take 1 tablet twice a day, except on Tuesday, Thursday, Saturday, take 1 tablet daily in the morning.   Yes [provider]  Multiple Vitamins-Minerals (CENTRUM SILVER PO) Take 1 tablet by mouth daily.  Yes [provider]  predniSONE (DELTASONE) 5 MG tablet Take 5 mg by mouth daily with breakfast.   Yes [provider]  travoprost, benzalkonium, (TRAVATAN) 0.004 % ophthalmic solution Place 1 drop into the left eye at bedtime.   Yes [provider]  Vitamin D, Ergocalciferol, (DRISDOL) 50000 units CAPS capsule TAKE 1 CAPSULE BY MOUTH ONCE WEEKLY. 09/08/17  Yes Cassandria Anger, MD  warfarin (COUMADIN) 4 MG tablet Take 4 mg by mouth daily.   Yes [provider]   Current Facility-Administered Medications  Medication Dose Route Frequency Provider Last Rate Last Dose  . 0.9 %  sodium chloride infusion  100 mL Intravenous PRN Befekadu, Eli Phillips, MD      . 0.9 %  sodium chloride infusion  100 mL Intravenous PRN Fran Lowes, MD      . acetaminophen (TYLENOL) tablet 650 mg  650 mg Oral Q6H PRN Reubin Milan, MD       Or  . acetaminophen (TYLENOL) suppository 650 mg  650 mg Rectal Q6H PRN Reubin Milan, MD      . alteplase (CATHFLO ACTIVASE) injection 2 mg  2 mg Intracatheter Once PRN Fran Lowes, MD      . atorvastatin (LIPITOR) tablet 20 mg  20 mg Oral Daily Reubin Milan, MD   20 mg at 12/15/17 0855  .  timolol (TIMOPTIC) 0.5 % ophthalmic solution 1 drop  1 drop Left Eye Daily Tat, David, MD   1 drop at 12/15/17 0859   And  . brimonidine (ALPHAGAN) 0.2 % ophthalmic solution 1 drop  1 drop Left Eye Daily Tat, David, MD   1 drop at 12/15/17 0858  . calcium acetate (PHOSLO) capsule 1,334 mg  1,334 mg Oral TID WC Reubin Milan, MD   1,334 mg at 12/15/17 1215  . Darbepoetin Alfa (ARANESP) injection 100 mcg  100 mcg Intravenous Q Tue-HD Alric Seton, PA-C      . dicyclomine (BENTYL) capsule 10 mg  10 mg Oral TID AC & HS Reubin Milan, MD   10 mg at 12/15/17 1215  . doxercalciferol (HECTOROL) injection 7 mcg  7 mcg Intravenous Q T,Th,Sa-HD Alric Seton, PA-C      . febuxostat (ULORIC) tablet 40 mg  40 mg Oral Daily Reubin Milan, MD   40 mg at 12/15/17 0857  . feeding supplement (NEPRO CARB STEADY) liquid 237 mL  237 mL Oral Q24H Tat, David, MD      . folic acid (FOLVITE) tablet 1 mg  1 mg Oral Daily Reubin Milan, MD   1 mg at 12/15/17 0857  . heparin injection 1,000 Units  1,000 Units Dialysis PRN Fran Lowes, MD      . hydroxychloroquine (PLAQUENIL) tablet 300 mg  300 mg Oral Daily Reubin Milan, MD   300 mg at 12/15/17 0857  . insulin aspart (novoLOG) injection 0-9 Units  0-9 Units Subcutaneous TID WC Reubin Milan, MD   2 Units at 12/15/17 1215  . ipratropium-albuterol (DUONEB) 0.5-2.5 (3) MG/3ML nebulizer solution 3 mL  3 mL Nebulization Q6H PRN Reubin Milan, MD      . iron polysaccharides (NIFEREX) capsule 150 mg  150 mg Oral TID Reubin Milan, MD   150 mg at 12/15/17 0857  . ketorolac (ACULAR) 0.5 % ophthalmic solution 1 drop  1 drop Left Eye QID Reubin Milan, MD   1 drop at 12/15/17 0858  . latanoprost (XALATAN) 0.005 % ophthalmic solution 1  drop  1 drop Left Eye QHS Reubin Milan, MD   1 drop at 12/15/17 0012  . lidocaine (PF) (XYLOCAINE) 1 % injection 5 mL  5 mL Intradermal PRN Fran Lowes, MD      .  lidocaine-prilocaine (EMLA) cream 1 application  1 application Topical PRN Fran Lowes, MD      . metoprolol tartrate (LOPRESSOR) tablet 25 mg  25 mg Oral Q T,Th,Sat-1800 Tat, David, MD      . metoprolol tartrate (LOPRESSOR) tablet 50 mg  50 mg Oral 2 times per day on Sun Mon Wed Fri Reubin Milan, MD   50 mg at 12/15/17 0011  . nutrition supplement (JUVEN) (JUVEN) powder packet 1 packet  1 packet Oral BID BM Tat, Shanon Brow, MD      . ondansetron [facility]) tablet 4 mg  4 mg Oral Q6H PRN Reubin Milan, MD       Or  . ondansetron [facility]) injection 4 mg  4 mg Intravenous Q6H PRN Reubin Milan, MD      . oxyCODONE (Oxy IR/ROXICODONE) immediate release tablet 5 mg  5 mg Oral Q6H PRN Reubin Milan, MD   5 mg at 12/15/17 0016  . pentafluoroprop-tetrafluoroeth (GEBAUERS) aerosol 1 application  1 application Topical PRN Fran Lowes, MD      . predniSONE (DELTASONE) tablet 5 mg  5 mg Oral Q breakfast Reubin Milan, MD   5 mg at 12/15/17 0856  . vancomycin (VANCOCIN) IVPB 1000 mg/200 mL premix  1,000 mg Intravenous Q T,Th,Sa-HD Orson Eva, MD      . Vitamin D (Ergocalciferol) (DRISDOL) capsule 50,000 Units  50,000 Units Oral Weekly Reubin Milan, MD   50,000 Units at 12/15/17 [phone]  . warfarin (COUMADIN) tablet 5 mg  5 mg Oral Once Tat, David, MD      . Warfarin - Pharmacist Dosing Inpatient   Does not apply Q24H Reubin Milan, MD       Labs: Basic Metabolic Panel: Recent Labs  Lab 12/14/17 1630  NA 138  K 4.0  CL 98*  CO2 28  GLUCOSE 67  BUN 41*  CREATININE 4.17*  CALCIUM 9.3   Liver Function Tests: Recent Labs  Lab 12/14/17 1630  AST 21  ALT 18  ALKPHOS 76  BILITOT 0.4  PROT 7.1  ALBUMIN 2.7*   CBC: Recent Labs  Lab 12/14/17 1630 12/15/17 0518  WBC 12.7* 8.7  NEUTROABS 10.1* 6.4  HGB 9.7* 8.6*  HCT 32.9* 29.0*  MCV 96.8 96.0  PLT 266 228   CBG: Recent Labs  Lab 12/15/17 0039 12/15/17 0810 12/15/17 1122 12/15/17 1457  12/15/17 1640  GLUCAP 63* 118* 173* 201* 174*   Iron Studies: No results for input(s): IRON, TIBC, TRANSFERRIN, FERRITIN in the last 72 hours. Studies/Results: Dg Chest 2 View  Result Date: 12/14/2017 CLINICAL DATA:  History of leg wound EXAM: CHEST - 2 VIEW COMPARISON:  11/03/2017 FINDINGS: Cardiac shadow is mildly prominent but likely related to the underlying dialysis state. Dialysis catheter is noted on the right and stable. The lungs are well aerated bilaterally without focal infiltrate or sizable effusion. No bony abnormality is seen. IMPRESSION: No active cardiopulmonary disease. Electronically Signed   By: Inez Catalina M.D.   On: 12/14/2017 16:19   Dg Tibia/fibula Right  Result Date: 12/14/2017 CLINICAL DATA:  Right leg ulcer EXAM: RIGHT TIBIA AND FIBULA - 2 VIEW COMPARISON:  11/02/2017 FINDINGS: No acute displaced fracture or malalignment. Fixating screw in the  distal fibula with old fibular shaft fracture deformity. Soft tissue ulcer defect posterior mid lower leg. No soft tissue gas. Vascular calcifications and soft tissue calcifications. IMPRESSION: 1. No acute osseous abnormality 2. Old fracture deformity of the distal fibula 3. Small ulcer posterior soft tissues of the mid lower leg. Electronically Signed   By: Donavan Foil M.D.   On: 12/14/2017 17:08   Mr Foot Right Wo Contrast  Result Date: 12/15/2017 CLINICAL DATA:  Skin ulcerations since the fall of 2018 on the right second and third toes in a diabetic patient with end-stage renal disease. EXAM: MRI OF THE RIGHT FOREFOOT WITHOUT CONTRAST TECHNIQUE: Multiplanar, multisequence MR imaging of the right forefoot was performed. No intravenous contrast was administered. COMPARISON:  Plain films right foot 12/14/2017. FINDINGS: This exam is degraded by patient motion. Bones/Joint/Cartilage No bone marrow edema to suggest osteomyelitis is identified. No fracture is seen. There is some degenerative change about the first MTP joint. Ligaments  Intact. Muscles and Tendons Mild intrasubstance increased T2 signal in intrinsic musculature is most consistent with diabetic myopathy. No intramuscular fluid collection. Soft tissues Negative. The patient's skin ulcerations are not visible likely due to motion. IMPRESSION: Negative for abscess or osteomyelitis. Electronically Signed   By: Inge Rise M.D.   On: 12/15/2017 08:36   Dg Foot Complete Right  Result Date: 12/14/2017 CLINICAL DATA:  Blister at right middle toe for 1 month EXAM: RIGHT FOOT COMPLETE - 3+ VIEW COMPARISON:  07/10/2016 FINDINGS: No acute displaced fracture or malalignment. Moderate degenerative changes at the first MTP joint. Vascular calcifications. Possible tiny erosions at the distal phalanges of the second and third digits. No soft tissue gas. No radiopaque foreign body. Moderate plantar calcaneal spur with plantar soft tissue calcifications. IMPRESSION: 1. No acute fracture or malalignment 2. Possible tiny erosions at the tufts of the second and third distal phalanges. If osteomyelitis is suspected, would correlate with MRI. Electronically Signed   By: Donavan Foil M.D.   On: 12/14/2017 17:11    ROS: As per HPI otherwise negative.  Physical Exam: Vitals:   12/14/17 2215 12/14/17 2300 12/14/17 2334 12/15/17 1459  BP:  (!) 167/61 (!) 179/45 (!) 145/90  Pulse: 84 82 82 (!) 106  Resp:   18 18  Temp:   98 F (36.7 C) 98.1 F (36.7 C)  TempSrc:   Oral Oral  SpO2: 100% 97% 99% 99%  Weight:      Height:   5\' 5"  (1.651 m)      General: elderly female NAD breathing easily on room air Head: NCAT sclera not icteric MMM  Neck: Supple.  Lungs: CTA bilaterally without wheezes, rales, or rhonchi.  Heart: RRR  Abdomen: soft + BS some right sided tenderness Lower extremities: tr right LE edema toes dusky, large posterior calf dry eschar- + erythema- tender  Neuro: A & O  X 3. Moves all extremities spontaneously. Psych:  Responds to questions appropriately with a normal  affect. Dialysis Access: right IJ TDC left upper AVF + bruit  Dialysis Orders: TTS 4 hr Reids 400/800 3 K 2.5 Ca EDW 78n right IJ TDC std Heparin, calcitriol 0.25 Mircera 50 last 3/5 --review of records show she has been pesenting below edw for at least several treatments with post HD wt 75.9 b3/16 and a net UF of 0.7 with her lowest BP being 127/44.  Assessment/Plan: 1. Bilateral LE chronic wounds with worsening right LE calf wound/mod SFA occlusive disease right leg - per primary 2. ESRD -  TTS HD today per routine- postpone HD until first round Wed due to high patient volume. - no acute need for HD today. Need to sort out issue with AVF-  3. Hypertension/volume  - needs lower edw at d/c- has been leaving below edw 4. Anemia  - hgb 8.1 - resume ESA Aranesp 100 due to decline in hgb - noted to have heme _ stools 2/23 during ED visit - on chroniccoumadin d/c niferex  Give prn IV Fe  5.  Metabolic bone disease -  Continue calcitriol/binders 6.  Nutrition -renal carb mod/vits/nepro 7.  Afib - on chronic coumadin/MTP  8. SLE 9. DM - per primary   Myriam Jacobson, PA-C [place] [phone] 12/15/2017, 4:48 PM   Pt seen, examined and agree w A/P as above.  Kelly Splinter MD [employer] pager [phone]   12/15/2017, 8:59 PM

## 2017-12-15 NOTE — Progress Notes (Signed)
Report called in to Mickel Baas, Therapist, sports at [place] at [phone]

## 2017-12-15 NOTE — Progress Notes (Signed)
Inpatient Diabetes Program Recommendations  AACE/ADA: New Consensus Statement on Inpatient Glycemic Control (2015)  Target Ranges:  Prepandial:   less than 140 mg/dL      Peak postprandial:   less than 180 mg/dL (1-2 hours)      Critically ill patients:  140 - 180 mg/dL   Results for [name], [name] (MRN [id number]) as of 12/15/2017 07:37  Ref. Range 12/14/2017 18:37 12/15/2017 00:05 12/15/2017 00:06 12/15/2017 00:39  Glucose-Capillary Latest Ref Range: 65 - 99 mg/dL 76 46 (L) 44 (LL) 63 (L)  Results for [name], [name] (MRN [id number]) as of 12/15/2017 07:37  Ref. Range 10/20/2017 10:29  Hemoglobin A1C Latest Ref Range: 4.8 - 5.6 % 5.6   Review of Glycemic Control  Diabetes history: DM2 Outpatient Diabetes medications: Levemir 7 units QHS, Humalog 2-10 units TID with meals Current orders for Inpatient glycemic control: Levemir 7 units QHS, Novolog 0-9 units TID with meals  Inpatient Diabetes Program Recommendations:  Insulin - Basal: Noted Levemir 7 units QHS ordered but NO Levemir was given last night. Glucose noted to be 44 mg/dl at 00:06 today. May want to consider discontinuing Levemir at this time and re-evaluating if basal insulin needs to be ordered.  Thanks, Barnie Alderman, RN, MSN, CDE Diabetes Coordinator Inpatient Diabetes Program [phone] (Team Pager from 8am to 5pm)

## 2017-12-15 NOTE — Progress Notes (Signed)
Pt arrived to 5M13. Orders reviewed, assessment complete. PT comfortable in bed and call bell within reach. Husband at bedside. Will continue to monitor.

## 2017-12-15 NOTE — Consult Note (Signed)
[facility] Nurse wound consult note Reason for Consult: LE wound Patient with large ulceration right posterior calf and foot wounds on the right foot. Has had for several weeks, has worsened in last week per family. Patient reports pain with rest. Distal pulses are weak, she has has arterial brachial studies in Dec. 2018 that were abnormal.  She has two areas on the right foot one that is partial thickness and open and one intact bulla. I was able to doppler pedal pulses but again weak even with the doppler.   Wound type: ? Arterial in the presence of DM and ESRD  Pressure Injury POA: NA Measurement: Posterior calf: 9cm x 5cm x 0cm; 100% black eschar Right 5th toes/lateral foot: 0.5cm x 0.5cm x 0.1cm: 100% pink, clean Right 3rd toe: intact serous filled bulla Wound bed:see above  Drainage (amount, consistency, odor) none noted at the time of my assessment, however no dressings in place. No odor noted  Periwound: some mild erythema noted pretibial/posterior; no edema bilaterally    Dressing procedure/placement/frequency: Will not add anything topically until surgery evaluates and we can collaborate with next steps. Consider vascular consultation If no surgical intervention planned would only paint areas with betadine to keep dry and stable    [place], [place], [place]

## 2017-12-15 NOTE — Progress Notes (Signed)
PROGRESS NOTE  [name] ZCH:[id number] DOB: 09/14/1946 DOA: 12/14/2017 PCP: Sharilyn Sites, MD  Brief History:  72 year old female with a history of ESRD, COPD, atrial fibrillation, lupus, diabetes mellitus type 2, hypertension, DVT right upper extremity, presenting from her PCPs office secondary to worsening of chronic right diabetic foot ulceration with concerns of cellulitis of the right calf.  The patient states that she has had wounds in her bilateral lower extremities for several months.  She denies any recent injury.  The patient had been following up by the wound care center but stopped going in January 2019 due to a change of her health insurance. Since then, she has noticed worsening of the wound and has been having worsening pain for the past few days.    In the emergency department, the patient was noted to have WBC 12.7, but she was afebrile hemodynamically stable.  X-rays of the right foot showed possible tiny erosions at the tops of the second and third distal phalanges.  The patient was started on Vancomycin Zosyn admitted for further evaluation. September 03, 2017, she had a lower extremity arterial duplex evaluation showed a patent right superficial femoral artery with velocities suggestive of a 75-99% stenosis involving the distal segment.  Patent left lower extremity arterial system without evidence of hemodynamically significant stenosis  Assessment/Plan: Cellulitis right lower extremity -MRI right lower extremity negative for osteomyelitis or abscess -failed outpt doxycycline--recently finished 10 days doxy on 12/12/17 -Continue vancomycin -Discontinue Zosyn -Start ceftriaxone -ESR 80 -Check CRP -concerned vascular insufficiency is more the problem than cellulitis as her wound is worse when compared to picture by Dr. Oneida Alar on 10/15/17 -transfer to [facility] for vascular eval--may need arteriogram--Dr. Scot Dock already contacted about transfer  Diabetic foot  ulcer/Peripheral arterial disease -Wound care consult -09/03/17--arterial duplex--patent right superficial femoral artery with velocities suggestive of a 75-99% stenosis involving the distal segment -follows Dr. Oneida Alar -Transfer to [facility] as discussed above  Atrial fibrillation -Rate controlled -Continue Coumadin -Continue metoprolol tartrate  ESRD -Consult nephrology for maintenance dialysis -Metabolic bone disease per nephrology  Essential hypertension, uncontrolled -Continue metoprolol tartrate change dosing to twice daily -hold dosing prior to dialysis  Systemic lupus erythematosus -continue Plaquenil and prednisone  Diabetes mellitus type 2 -Check hemoglobin A1c -Holding Levemir secondary to hypoglycemia -NovoLog sliding scale  Hyperlipidemia -Continue statin  COPD -stable on RA    Disposition Plan:   Transfer to [facility] Family Communication:  Spouse updated  Consultants:  Renal, VVS  Code Status:  FULL   DVT Prophylaxis:  [place] Heparin    Procedures: As Listed in Progress Note Above  Antibiotics: vanco 3/18>>> Zosyn 3/18    Subjective: Patient states that the right lower extremity is feeling a little bit better in terms of swelling.  She still has significant pain.  She denies any fever, chills, chest pain, shortness of breath, nausea, vomiting, diarrhea, abdominal pain.  Objective: Vitals:   12/14/17 2200 12/14/17 2215 12/14/17 2300 12/14/17 2334  BP: (!) 167/43  (!) 167/61 (!) 179/45  Pulse: 81 84 82 82  Resp:    18  Temp:    98 F (36.7 C)  TempSrc:    Oral  SpO2: 97% 100% 97% 99%  Weight:      Height:    _0  (1.651 m)    Intake/Output Summary (Last 24 hours) at 12/15/2017 0928 Last data filed at 12/15/2017 0900 Gross per 24 hour  Intake 790  ml  Output -  Net 790 ml   Weight change:  Exam:   General:  Pt is alert, follows commands appropriately, not in acute distress  HEENT: No icterus, No thrush, No neck mass,  [place]/AT  Cardiovascular: RRR, S1/S2, no rubs, no gallops  Respiratory: CTA bilaterally, no wheezing, no crackles, no rhonchi  Abdomen: Soft/+BS, non tender, non distended, no guarding  Extremities: Erythema on the posterior calf of the right lower extremity with eschar lesion.  See picture below.  Right posterior calf   Data Reviewed: I have personally reviewed following labs and imaging studies Basic Metabolic Panel: Recent Labs  Lab 12/14/17 1630  NA 138  K 4.0  CL 98*  CO2 28  GLUCOSE 67  BUN 41*  CREATININE 4.17*  CALCIUM 9.3   Liver Function Tests: Recent Labs  Lab 12/14/17 1630  AST 21  ALT 18  ALKPHOS 76  BILITOT 0.4  PROT 7.1  ALBUMIN 2.7*   No results for input(s): LIPASE, AMYLASE in the last 168 hours. No results for input(s): AMMONIA in the last 168 hours. Coagulation Profile: Recent Labs  Lab 12/09/17 12/09/17 1623 12/14/17 1630 12/15/17 0518  INR 1.9 1.1 1.87 1.74   CBC: Recent Labs  Lab 12/14/17 1630 12/15/17 0518  WBC 12.7* 8.7  NEUTROABS 10.1* 6.4  HGB 9.7* 8.6*  HCT 32.9* 29.0*  MCV 96.8 96.0  PLT 266 228   Cardiac Enzymes: No results for input(s): CKTOTAL, CKMB, CKMBINDEX, TROPONINI in the last 168 hours. BNP: Invalid input(s): POCBNP CBG: Recent Labs  Lab 12/14/17 1837 12/15/17 0005 12/15/17 0006 12/15/17 0039 12/15/17 0810  GLUCAP 76 46* 44* 63* 118*   HbA1C: No results for input(s): HGBA1C in the last 72 hours. Urine analysis:    Component Value Date/Time   COLORURINE AMBER (A) 11/19/2017 0300   APPEARANCEUR HAZY (A) 11/19/2017 0300   LABSPEC 1.021 11/19/2017 0300   PHURINE 5.0 11/19/2017 0300   GLUCOSEU NEGATIVE 11/19/2017 0300   HGBUR NEGATIVE 11/19/2017 0300   BILIRUBINUR NEGATIVE 11/19/2017 0300   KETONESUR NEGATIVE 11/19/2017 0300   PROTEINUR NEGATIVE 11/19/2017 0300   UROBILINOGEN 1.0 03/17/2014 1830   NITRITE NEGATIVE 11/19/2017 0300   LEUKOCYTESUR MODERATE (A) 11/19/2017 0300   Sepsis  Labs: '@LABRCNTIP'$ (procalcitonin:4,lacticidven:4) ) Recent Results (from the past 240 hour(s))  Culture, blood (Routine x 2)     Status: None (Preliminary result)   Collection Time: 12/14/17  4:30 PM  Result Value Ref Range Status   Specimen Description RIGHT ANTECUBITAL  Final   Special Requests   Final    BOTTLES DRAWN AEROBIC AND ANAEROBIC Blood Culture adequate volume   Culture   Final    NO GROWTH < 24 HOURS Performed at [facility], [address] [place], [place] [address]    Report Status PENDING  Incomplete  Culture, blood (Routine x 2)     Status: None (Preliminary result)   Collection Time: 12/14/17  4:31 PM  Result Value Ref Range Status   Specimen Description BLOOD RIGHT HAND  Final   Special Requests   Final    BOTTLES DRAWN AEROBIC AND ANAEROBIC Blood Culture adequate volume   Culture   Final    NO GROWTH < 24 HOURS Performed at [facility], [address] [place], [place] [address]    Report Status PENDING  Incomplete     Scheduled Meds: . atorvastatin  20 mg Oral Daily  . timolol  1 drop Left Eye Daily   And  . brimonidine  1 drop Left Eye Daily  . calcium acetate  1,334 mg Oral TID WC  . dicyclomine  10 mg Oral TID AC & HS  . febuxostat  40 mg Oral Daily  . folic acid  1 mg Oral Daily  . heparin injection (subcutaneous)  5,000 Units Subcutaneous Q8H  . hydroxychloroquine  300 mg Oral Daily  . insulin aspart  0-9 Units Subcutaneous TID WC  . insulin detemir  7 Units Subcutaneous Q2200  . iron polysaccharides  150 mg Oral TID  . ketorolac  1 drop Left Eye QID  . latanoprost  1 drop Left Eye QHS  . metoprolol tartrate  25 mg Oral Once per day on Sun Tue Thu  . metoprolol tartrate  50 mg Oral 2 times per day on Sun Mon Wed Fri  . predniSONE  5 mg Oral Q breakfast  . Vitamin D (Ergocalciferol)  50,000 Units Oral Weekly  . Warfarin - Pharmacist Dosing Inpatient   Does not apply Q24H   Continuous Infusions: . piperacillin-tazobactam (ZOSYN)  IV 3.375 g  (12/15/17 0858)    Procedures/Studies: Dg Chest 2 View  Result Date: 12/14/2017 CLINICAL DATA:  History of leg wound EXAM: CHEST - 2 VIEW COMPARISON:  11/03/2017 FINDINGS: Cardiac shadow is mildly prominent but likely related to the underlying dialysis state. Dialysis catheter is noted on the right and stable. The lungs are well aerated bilaterally without focal infiltrate or sizable effusion. No bony abnormality is seen. IMPRESSION: No active cardiopulmonary disease. Electronically Signed   By: Inez Catalina M.D.   On: 12/14/2017 16:19   Dg Tibia/fibula Right  Result Date: 12/14/2017 CLINICAL DATA:  Right leg ulcer EXAM: RIGHT TIBIA AND FIBULA - 2 VIEW COMPARISON:  11/02/2017 FINDINGS: No acute displaced fracture or malalignment. Fixating screw in the distal fibula with old fibular shaft fracture deformity. Soft tissue ulcer defect posterior mid lower leg. No soft tissue gas. Vascular calcifications and soft tissue calcifications. IMPRESSION: 1. No acute osseous abnormality 2. Old fracture deformity of the distal fibula 3. Small ulcer posterior soft tissues of the mid lower leg. Electronically Signed   By: Donavan Foil M.D.   On: 12/14/2017 17:08   Mr Foot Right Wo Contrast  Result Date: 12/15/2017 CLINICAL DATA:  Skin ulcerations since the fall of 2018 on the right second and third toes in a diabetic patient with end-stage renal disease. EXAM: MRI OF THE RIGHT FOREFOOT WITHOUT CONTRAST TECHNIQUE: Multiplanar, multisequence MR imaging of the right forefoot was performed. No intravenous contrast was administered. COMPARISON:  Plain films right foot 12/14/2017. FINDINGS: This exam is degraded by patient motion. Bones/Joint/Cartilage No bone marrow edema to suggest osteomyelitis is identified. No fracture is seen. There is some degenerative change about the first MTP joint. Ligaments Intact. Muscles and Tendons Mild intrasubstance increased T2 signal in intrinsic musculature is most consistent with  diabetic myopathy. No intramuscular fluid collection. Soft tissues Negative. The patient's skin ulcerations are not visible likely due to motion. IMPRESSION: Negative for abscess or osteomyelitis. Electronically Signed   By: Inge Rise M.D.   On: 12/15/2017 08:36   Dg Foot Complete Right  Result Date: 12/14/2017 CLINICAL DATA:  Blister at right middle toe for 1 month EXAM: RIGHT FOOT COMPLETE - 3+ VIEW COMPARISON:  07/10/2016 FINDINGS: No acute displaced fracture or malalignment. Moderate degenerative changes at the first MTP joint. Vascular calcifications. Possible tiny erosions at the distal phalanges of the second and third digits. No soft tissue gas. No radiopaque foreign body. Moderate  plantar calcaneal spur with plantar soft tissue calcifications. IMPRESSION: 1. No acute fracture or malalignment 2. Possible tiny erosions at the tufts of the second and third distal phalanges. If osteomyelitis is suspected, would correlate with MRI. Electronically Signed   By: Donavan Foil M.D.   On: 12/14/2017 17:11    Orson Eva, DO  [facility] Pager [phone]  If 7PM-7AM, please contact night-coverage [email or web address] Password [email or web address] 12/15/2017, 9:28 AM   LOS: 1 day

## 2017-12-15 NOTE — Progress Notes (Signed)
Pharmacy note: vancomycin  -Dialysis scheduled for 3/20.  Plan -Will give vancomycin 1000mg  IV post HD 3/20 then 1000mg  IV TTS -Will follow HD schedule  Hildred Laser, PharmD Clinical Pharmacist 12/15/2017 7:11 PM   e

## 2017-12-15 NOTE — Progress Notes (Signed)
ANTICOAGULATION CONSULT NOTE - Follow up [facility] for Warfarin Indication: atrial fibrillation  Allergies  Allergen Reactions  . Ace Inhibitors Cough    Patient Measurements: Height: 5\' 5"  (165.1 cm) Weight: 179 lb (81.2 kg) IBW/kg (Calculated) : 57  Vital Signs: Temp: 98 F (36.7 C) (03/18 2334) Temp Source: Oral (03/18 2334) BP: 179/45 (03/18 2334) Pulse Rate: 82 (03/18 2334)  Labs: Recent Labs    12/14/17 1630 12/15/17 0518  HGB 9.7* 8.6*  HCT 32.9* 29.0*  PLT 266 228  LABPROT 21.4* 20.2*  INR 1.87 1.74  CREATININE 4.17*  --    Estimated Creatinine Clearance: 12.8 mL/min (A) (by C-G formula based on SCr of 4.17 mg/dL (H)).  Medical History: Past Medical History:  Diagnosis Date  . Anemia   . Arthritis   . Asthma   . Atrial fibrillation ([place])   . Atrial flutter [facility])    TEE/DCCV December 2013 - on Xarelto [facility])  . Bone spur of toe    Right 5th toe  . Chronic diastolic CHF (congestive heart failure) (HCC)    a. EF 50-55% by echo in 2015  . CKD (chronic kidney disease) stage 4, GFR 15-29 ml/min (HCC)    a. s/p fistula placement but not on HD. Followed by Nephrology.   . Colon polyposis   . COPD (chronic obstructive pulmonary disease) ([place])   . DVT of upper extremity (deep vein thrombosis) ([place])    Right basilic vein, June 9417  . Essential hypertension, benign   . Fractures   . Glaucoma   . SLE (systemic lupus erythematosus) ([place])   . Type 2 diabetes mellitus ([place])   . UTI (lower urinary tract infection)    Medications:  Medications Prior to Admission  Medication Sig Dispense Refill Last Dose  . acetaminophen (TYLENOL) 500 MG tablet Take 1,000 mg by mouth every 6 (six) hours as needed. For pain   12/13/2017 at Unknown time  . albuterol (ACCUNEB) 0.63 MG/3ML nebulizer solution Take 1 ampule by nebulization every 6 (six) hours as needed for wheezing or shortness of breath.    unknown  . albuterol (PROVENTIL HFA;VENTOLIN HFA) 108 (90  BASE) MCG/ACT inhaler Inhale 2 puffs into the lungs every 6 (six) hours as needed. For shortness of breath   unknown  . atorvastatin (LIPITOR) 20 MG tablet Take 20 mg by mouth daily.   12/13/2017 at Unknown time  . brimonidine-timolol (COMBIGAN) 0.2-0.5 % ophthalmic solution Place 1 drop into the left eye daily.    12/13/2017 at Unknown time  . calcium acetate (PHOSLO) 667 MG capsule Take 2 capsules (1,334 mg total) by mouth 3 (three) times daily with meals. 90 capsule 0 12/14/2017 at Unknown time  . collagenase (SANTYL) ointment Apply 1 application topically daily.   12/14/2017 at Unknown time  . diclofenac (VOLTAREN) 0.1 % ophthalmic solution Place 1 drop into the left eye daily. Uses at lunch time   12/14/2017 at Unknown time  . dicyclomine (BENTYL) 10 MG capsule Take 10 mg by mouth 4 (four) times daily -  before meals and at bedtime.   12/14/2017 at Unknown time  . febuxostat (ULORIC) 40 MG tablet Take 40 mg by mouth daily.   12/14/2017 at Unknown time  . folic acid (FOLVITE) 1 MG tablet Take 1 mg by mouth daily.   12/14/2017 at Unknown time  . HYDROcodone-acetaminophen (NORCO/VICODIN) 5-325 MG tablet Take 1-2 tablets by mouth every 8 (eight) hours as needed for moderate pain. 60 tablet 0 Past  Week at Unknown time  . hydroxychloroquine (PLAQUENIL) 200 MG tablet Take 300 mg by mouth as directed. Take 1 1/2 tablets daily   12/14/2017 at Unknown time  . Insulin Detemir (LEVEMIR FLEXTOUCH) 100 UNIT/ML Pen Inject 7 Units into the skin daily at 10 pm.   12/13/2017 at Unknown time  . insulin lispro (HUMALOG KWIKPEN) 100 UNIT/ML KiwkPen Give per sliding scale from 2-10 units subcutaneous three times a day.   12/14/2017 at Unknown time  . iron polysaccharides (NIFEREX) 150 MG capsule Take 150 mg by mouth 3 (three) times daily.    12/14/2017 at Unknown time  . metoprolol tartrate (LOPRESSOR) 50 MG tablet Take 50 mg by mouth 2 (two) times daily. Take 1 tablet twice a day, except on Tuesday, Thursday, Saturday, take 1  tablet daily in the morning.   12/14/2017 at 0830  . Multiple Vitamins-Minerals (CENTRUM SILVER PO) Take 1 tablet by mouth daily.   12/14/2017 at Unknown time  . predniSONE (DELTASONE) 5 MG tablet Take 5 mg by mouth daily with breakfast.   12/14/2017 at Unknown time  . travoprost, benzalkonium, (TRAVATAN) 0.004 % ophthalmic solution Place 1 drop into the left eye at bedtime.   unknown  . Vitamin D, Ergocalciferol, (DRISDOL) 50000 units CAPS capsule TAKE 1 CAPSULE BY MOUTH ONCE WEEKLY. 12 capsule 0 Past Week at Unknown time  . warfarin (COUMADIN) 4 MG tablet Take 4 mg by mouth daily.   12/13/2017 at 1700     Home dose is 4mg  daily.  Assessment: Okay for Protocol, no bleeding noted. History of ESRD, COPD, atrial fibrillation, lupus, diabetes mellitus type 2, hypertension, DVT right upper extremity, presenting from her PCPs office secondary to worsening of chronic right diabetic foot ulceration with concerns of cellulitis of the right calf.  INR below goal and just slightly lower than yesterday afternoon.   Goal of Therapy:  INR 2-3   Plan:  Warfarin 5mg  PO x 1. Daily PT / INR Monitor for signs and symptoms of bleeding.   Isac Sarna, BS Pharm D, BCPS Clinical Pharmacist Pager [phone] 12/15/2017,10:58 AM

## 2017-12-15 NOTE — Progress Notes (Addendum)
Initial Nutrition Assessment  DOCUMENTATION CODES:  Obesity     INTERVENTION:  CHO modified diet with 1200 ml fluid restriction  Nepro Shake 237 ml po daily, supplement provides 425 kcal and 19 grams protein   Recommend consider adding a Renal vitamin  JUVEN BID    NUTRITION DIAGNOSIS:   Increased nutrient needs(Chronic diaylsis ) related to  wound healing, chronic illness(Chronic diaylsis), as evidenced by estimated needs(protein and energy increased).  GOAL:   Patient will meet greater than or equal to 90% of their needs MONITOR:   Supplement acceptance, PO intake, Labs, Weight trends, Skin REASON FOR ASSESSMENT:   Consult Wound healing  ASSESSMENT: the patient is a 72 yo female with ESRD with HD treatments three times weekly. Hx also of lupus, CHF, COPD, DM-2 and HTN. She was discharged from [facility] on 3/8 weight at 178.0 lb. At the nursing center she had been eating 25-75% of No added salt/ consistent cho meals.  Today PO intake 50% of breakfast.Therapeutic diet and fluid restriction noted below. RD visited patient and personal are here ready to take her to [facility]. Unable to complete nutrition hx or physical assessment.  Sup/Meds: Patient likes Nepro and was consuming 100% at SNF. Her weight has been between current 179 lbs range up to 197 lbs in December. Fluctuations expected given her ESRD and maintainance dialysis.  The patient is being transferred to [facility] for vascular evaluation. Lower extremity wound with large ulceration right posterior calf and multiple wounds to right foot (third and fifth toes) as well per WOC.  BMP Latest Ref Rng & Units 12/14/2017 11/30/2017 11/26/2017  Glucose 65 - 99 mg/dL 67 186(H) 59(L)  BUN 6 - 20 mg/dL 41(H) 37(H) 28(H)  Creatinine 0.44 - 1.00 mg/dL 4.17(H) 3.56(H) 3.37(H)  BUN/Creat Ratio 12 - 28 - - -  Sodium 135 - 145 mmol/L 138 136 136  Potassium 3.5 - 5.1 mmol/L 4.0 4.0 4.2  Chloride 101 - 111 mmol/L 98(L) 98(L)  100(L)  CO2 22 - 32 mmol/L 28 28 25   Calcium 8.9 - 10.3 mg/dL 9.3 8.8(L) 8.6(L)    Scheduled Meds: . atorvastatin  20 mg Oral Daily  . timolol  1 drop Left Eye Daily   And  . brimonidine  1 drop Left Eye Daily  . calcium acetate  1,334 mg Oral TID WC  . dicyclomine  10 mg Oral TID AC & HS  . epoetin (EPOGEN/PROCRIT) injection  10,000 Units Subcutaneous Q T,Th,Sa-HD  . febuxostat  40 mg Oral Daily  . folic acid  1 mg Oral Daily  . hydroxychloroquine  300 mg Oral Daily  . insulin aspart  0-9 Units Subcutaneous TID WC  . iron polysaccharides  150 mg Oral TID  . ketorolac  1 drop Left Eye QID  . latanoprost  1 drop Left Eye QHS  . metoprolol tartrate  25 mg Oral Q T,Th,Sat-1800  . metoprolol tartrate  50 mg Oral 2 times per day on Sun Mon Wed Fri  . predniSONE  5 mg Oral Q breakfast  . Vitamin D (Ergocalciferol)  50,000 Units Oral Weekly  . warfarin  5 mg Oral Once  . Warfarin - Pharmacist Dosing Inpatient   Does not apply Q24H   Continuous Infusions: . vancomycin     PRN Meds:.acetaminophen **OR** acetaminophen, ipratropium-albuterol, ondansetron **OR** ondansetron (ZOFRAN) IV, oxyCODONE   NUTRITION - FOCUSED PHYSICAL EXAM: Unable to perform at this time   Diet Order:  Diet renal/carb modified with fluid  restriction Diet-HS Snack? Nothing; Fluid restriction: 1200 mL Fluid; Room service appropriate? Yes; Fluid consistency: Thin  EDUCATION NEEDS:   No education needs have been identified at this time Skin:  Skin Assessment: Skin Integrity Issues: Skin Integrity Issues:: (Refer to [facility] evaluation)  Last BM:  3/18  Height:   Ht Readings from Last 1 Encounters:  12/14/17 5\' 5"  (1.651 m)    Weight:   Wt Readings from Last 1 Encounters:  12/14/17 179 lb (81.2 kg)    Ideal Body Weight:   57 kg  BMI:  Body mass index is 29.79 kg/m.  Estimated Nutritional Needs:   Kcal:  1800-2000  Protein:  85-95g Pro   Fluid:   1.2 L fluid   Colman Cater  MS,RD,CSG,LDN Office: [phone] Pager: [phone]

## 2017-12-16 ENCOUNTER — Ambulatory Visit (HOSPITAL_COMMUNITY): Payer: Medicare Other

## 2017-12-16 ENCOUNTER — Other Ambulatory Visit (HOSPITAL_COMMUNITY): Payer: Medicare Other

## 2017-12-16 ENCOUNTER — Inpatient Hospital Stay (HOSPITAL_COMMUNITY): Payer: Medicare Other

## 2017-12-16 LAB — RENAL FUNCTION PANEL
ALBUMIN: 2.2 g/dL — AB (ref 3.5–5.0)
ANION GAP: 9 (ref 5–15)
BUN: 52 mg/dL — AB (ref 6–20)
CALCIUM: 9.1 mg/dL (ref 8.9–10.3)
CO2: 26 mmol/L (ref 22–32)
Chloride: 102 mmol/L (ref 101–111)
Creatinine, Ser: 3.76 mg/dL — ABNORMAL HIGH (ref 0.44–1.00)
GFR calc Af Amer: 13 mL/min — ABNORMAL LOW (ref >60)
GFR, EST NON AFRICAN AMERICAN: 11 mL/min — AB (ref >60)
Glucose, Bld: 133 mg/dL — ABNORMAL HIGH (ref 65–99)
PHOSPHORUS: 2.1 mg/dL — AB (ref 2.5–4.6)
POTASSIUM: 3.9 mmol/L (ref 3.5–5.1)
SODIUM: 137 mmol/L (ref 135–145)

## 2017-12-16 LAB — GLUCOSE, CAPILLARY
GLUCOSE-CAPILLARY: 101 mg/dL — AB (ref 65–99)
GLUCOSE-CAPILLARY: 138 mg/dL — AB (ref 65–99)
Glucose-Capillary: 44 mg/dL — CL (ref 65–99)
Glucose-Capillary: 162 mg/dL — ABNORMAL HIGH (ref 65–99)

## 2017-12-16 LAB — CBC WITH DIFFERENTIAL/PLATELET
BASOS ABS: 0 10*3/uL (ref 0.0–0.1)
Basophils Relative: 0 %
Eosinophils Absolute: 0.1 10*3/uL (ref 0.0–0.7)
Eosinophils Relative: 1 %
HCT: 28.7 % — ABNORMAL LOW (ref 36.0–46.0)
HEMOGLOBIN: 8.6 g/dL — AB (ref 12.0–15.0)
LYMPHS ABS: 1.2 10*3/uL (ref 0.7–4.0)
LYMPHS PCT: 14 %
MCH: 28.3 pg (ref 26.0–34.0)
MCHC: 30 g/dL (ref 30.0–36.0)
MCV: 94.4 fL (ref 78.0–100.0)
Monocytes Absolute: 0.8 10*3/uL (ref 0.1–1.0)
Monocytes Relative: 10 %
NEUTROS PCT: 75 %
Neutro Abs: 6.4 10*3/uL (ref 1.7–7.7)
Platelets: 191 10*3/uL (ref 150–400)
RBC: 3.04 MIL/uL — AB (ref 3.87–5.11)
RDW: 17.5 % — ABNORMAL HIGH (ref 11.5–15.5)
WBC: 8.5 10*3/uL (ref 4.0–10.5)

## 2017-12-16 LAB — PROTIME-INR
INR: 1.83
PROTHROMBIN TIME: 21 s — AB (ref 11.4–15.2)

## 2017-12-16 MED ORDER — OXYCODONE HCL 5 MG PO TABS
ORAL_TABLET | ORAL | Status: AC
  Administered 2017-12-16: 5 mg via ORAL
  Filled 2017-12-16: qty 1

## 2017-12-16 MED ORDER — WARFARIN SODIUM 5 MG PO TABS
5.0000 mg | ORAL_TABLET | Freq: Once | ORAL | Status: AC
  Administered 2017-12-16: 5 mg via ORAL
  Filled 2017-12-16 (×2): qty 1

## 2017-12-16 MED ORDER — OXYCODONE HCL 5 MG PO TABS
ORAL_TABLET | ORAL | Status: AC
  Filled 2017-12-16: qty 1

## 2017-12-16 MED ORDER — VANCOMYCIN HCL IN DEXTROSE 1-5 GM/200ML-% IV SOLN
INTRAVENOUS | Status: AC
  Administered 2017-12-16: 1000 mg via INTRAVENOUS
  Filled 2017-12-16: qty 200

## 2017-12-16 MED ORDER — DARBEPOETIN ALFA 100 MCG/0.5ML IJ SOSY
PREFILLED_SYRINGE | INTRAMUSCULAR | Status: AC
  Administered 2017-12-16: 100 ug via INTRAVENOUS
  Filled 2017-12-16: qty 0.5

## 2017-12-16 MED ORDER — SODIUM CHLORIDE 0.9 % IV SOLN
62.5000 mg | INTRAVENOUS | Status: DC
  Administered 2017-12-17 – 2017-12-24 (×2): 62.5 mg via INTRAVENOUS
  Filled 2017-12-16 (×4): qty 5

## 2017-12-16 MED ORDER — VANCOMYCIN HCL IN DEXTROSE 1-5 GM/200ML-% IV SOLN
1000.0000 mg | INTRAVENOUS | Status: AC
  Administered 2017-12-16: 1000 mg via INTRAVENOUS

## 2017-12-16 NOTE — Consult Note (Addendum)
Referring Physician: Dr Berle Mull  Patient name: [name] MRN: [id number] DOB: 12/27/1945 Sex: female  REASON FOR CONSULT: right calf wound  HPI: [name] is a 72 y.o. female known to me from prior calf wounds that healed spotaneously.  She recently developed a blister on the right calf and now has been admitted with cellulitis and expanding wound. Other medical problems include ESRD, afib, hypertension diabetes which are controlled.  She is on prednisone presumably for lupus.  Past Medical History:  Diagnosis Date  . Anemia   . Arthritis   . Asthma   . Atrial fibrillation ([place])   . Atrial flutter [facility])    TEE/DCCV December 2013 - on Xarelto [facility])  . Bone spur of toe    Right 5th toe  . Chronic diastolic CHF (congestive heart failure) (HCC)    a. EF 50-55% by echo in 2015  . CKD (chronic kidney disease) stage 4, GFR 15-29 ml/min (HCC)    a. s/p fistula placement but not on HD. Followed by Nephrology.   . Colon polyposis   . COPD (chronic obstructive pulmonary disease) ([place])   . DVT of upper extremity (deep vein thrombosis) ([place])    Right basilic vein, June 6834  . Essential hypertension, benign   . Fractures   . Glaucoma   . SLE (systemic lupus erythematosus) ([place])   . Type 2 diabetes mellitus ([place])   . UTI (lower urinary tract infection)    Past Surgical History:  Procedure Laterality Date  . ABDOMINAL HYSTERECTOMY  1983  . ANKLE SURGERY  1993  . AV FISTULA PLACEMENT Left 03/27/2014   Procedure: ARTERIOVENOUS (AV) FISTULA CREATION;  Surgeon: Rosetta Posner, MD;  Location: [facility];  Service: Vascular;  Laterality: Left;  . BACK SURGERY  1980  . CARDIOVASCULAR STRESS TEST  12/19/2009   no stress induced rhythm abnormalities, ekg negative for ischemia  . CARDIOVERSION  09/16/2012   Procedure: CARDIOVERSION;  Surgeon: Sanda Klein, MD;  Location: [facility] ENDOSCOPY;  Service: Cardiovascular;  Laterality: N/A;  . COLONOSCOPY  09/20/2012   Dr. [name]:  multiple tubular adenomas   . COLONOSCOPY  2005   Dr. [name]. Polyps, path unknown   . COLONOSCOPY N/A 07/24/2016   Procedure: COLONOSCOPY;  Surgeon: [name], MD;  Location[facility] ENDO [facility];  Service: Endoscopy;  Laterality: N/A;  9:00 am  . ESOPHAGOGASTRODUODENOSCOPY  09/20/2012   Dr. [name]: duodenal erosion and possible resolving ulcer at the angularis   . IR FLUORO GUIDE CV LINE RIGHT  11/09/2017  . IR US GUIDE VASC ACCESS RIGHT  11/09/2017  . TEE WITHOUT CARDIOVERSION  09/16/2012   Procedure: TRANSESOPHAGEAL ECHOCARDIOGRAM (TEE);  Surgeon: Sanda Klein, MD;  Location: [facility] ENDOSCOPY;  Service: Cardiovascular;  Laterality: N/A;  . TRANSESOPHAGEAL ECHOCARDIOGRAM WITH CARDIOVERSION  09/16/2012   EF 60-65%, moderate LVH, moderate regurg of the aortic valve, LA moderately dilated    Family History  Problem Relation Age of Onset  . Diabetes Brother   . Diabetes Father   . Hypertension Father   . Hypertension Sister   . Stroke Mother   . Diabetes Sister   . Hypertension Brother   . Colon cancer Neg Hx     SOCIAL HISTORY: Social History   Socioeconomic History  . Marital status: Married    Spouse name: Not on file  . Number of children: Not on file  . Years of education: Not on file  . Highest education level: Not on file  Social Needs  . Financial resource strain: Not on file  . Food insecurity - worry: Not on file  . Food insecurity - inability: Not on file  . Transportation needs - medical: Not on file  . Transportation needs - non-medical: Not on file  Occupational History  . Not on file  Tobacco Use  . Smoking status: Former Smoker    Packs/day: 1.00    Years: 30.00    Pack years: 30.00    Types: Cigarettes    Last attempt to quit: 08/29/2012    Years since quitting: 5.3  . Smokeless tobacco: Never Used  . Tobacco comment: quit 08-2012  Substance and Sexual Activity  . Alcohol use: No    Alcohol/week: 0.0 oz  . Drug use: No  . Sexual activity: Yes     Partners: Male  Other Topics Concern  . Not on file  Social History Narrative  . Not on file    Allergies  Allergen Reactions  . Ace Inhibitors Cough    Current Facility-Administered Medications  Medication Dose Route Frequency Provider Last Rate Last Dose  . acetaminophen (TYLENOL) tablet 650 mg  650 mg Oral Q6H PRN Reubin Milan, MD   650 mg at 12/15/17 2229   Or  . acetaminophen (TYLENOL) suppository 650 mg  650 mg Rectal Q6H PRN Reubin Milan, MD      . atorvastatin (LIPITOR) tablet 20 mg  20 mg Oral Daily Reubin Milan, MD   20 mg at 12/16/17 1403  . timolol (TIMOPTIC) 0.5 % ophthalmic solution 1 drop  1 drop Left Eye Daily Tat, David, MD   1 drop at 12/15/17 0859   And  . brimonidine (ALPHAGAN) 0.2 % ophthalmic solution 1 drop  1 drop Left Eye Daily Tat, David, MD   1 drop at 12/15/17 0858  . [START ON 12/17/2017] calcitRIOL (ROCALTROL) capsule 0.25 mcg  0.25 mcg Oral Q T,Th,Sa-HD Alric Seton, PA-C   0.25 mcg at 12/16/17 1403  . calcium acetate (PHOSLO) capsule 1,334 mg  1,334 mg Oral TID WC Reubin Milan, MD   1,334 mg at 12/16/17 1746  . Darbepoetin Alfa (ARANESP) injection 100 mcg  100 mcg Intravenous Q Wed-HD Orson Eva, MD   100 mcg at 12/16/17 1309  . dicyclomine (BENTYL) capsule 10 mg  10 mg Oral TID AC & HS Reubin Milan, MD   10 mg at 12/16/17 1746  . febuxostat (ULORIC) tablet 40 mg  40 mg Oral Daily Reubin Milan, MD   40 mg at 12/16/17 1402  . feeding supplement (NEPRO CARB STEADY) liquid 237 mL  237 mL Oral Q24H Orson Eva, MD   237 mL at 12/15/17 1658  . [START ON 12/17/2017] ferric gluconate (NULECIT) 62.5 mg in sodium chloride 0.9 % 100 mL IVPB  62.5 mg Intravenous Q Thu-HD Alric Seton, PA-C      . folic acid (FOLVITE) tablet 1 mg  1 mg Oral Daily Reubin Milan, MD   1 mg at 12/16/17 1404  . hydroxychloroquine (PLAQUENIL) tablet 300 mg  300 mg Oral Daily Reubin Milan, MD   300 mg at 12/16/17 1404  . insulin  aspart (novoLOG) injection 0-9 Units  0-9 Units Subcutaneous TID WC Reubin Milan, MD   2 Units at 12/16/17 1746  . ipratropium-albuterol (DUONEB) 0.5-2.5 (3) MG/3ML nebulizer solution 3 mL  3 mL Nebulization Q6H PRN Reubin Milan, MD      . ketorolac (ACULAR) 0.5 % ophthalmic  solution 1 drop  1 drop Left Eye QID Reubin Milan, MD   1 drop at 12/16/17 1746  . latanoprost (XALATAN) 0.005 % ophthalmic solution 1 drop  1 drop Left Eye QHS Reubin Milan, MD   1 drop at 12/15/17 0012  . metoprolol tartrate (LOPRESSOR) tablet 25 mg  25 mg Oral Q Levan Hurst, MD   25 mg at 12/15/17 1810  . metoprolol tartrate (LOPRESSOR) tablet 50 mg  50 mg Oral 2 times per day on Sun Mon Wed Fri Reubin Milan, MD   50 mg at 12/16/17 1404  . nutrition supplement (JUVEN) (JUVEN) powder packet 1 packet  1 packet Oral BID BM Orson Eva, MD   1 packet at 12/16/17 1402  . ondansetron (ZOFRAN) tablet 4 mg  4 mg Oral Q6H PRN Reubin Milan, MD   4 mg at 12/15/17 2227   Or  . ondansetron [facility]) injection 4 mg  4 mg Intravenous Q6H PRN Reubin Milan, MD      . oxyCODONE (Oxy IR/ROXICODONE) immediate release tablet 5 mg  5 mg Oral Q6H PRN Reubin Milan, MD   5 mg at 12/16/17 1310  . predniSONE (DELTASONE) tablet 5 mg  5 mg Oral Q breakfast Reubin Milan, MD   5 mg at 12/16/17 1402  . vancomycin (VANCOCIN) IVPB 1000 mg/200 mL premix  1,000 mg Intravenous Q T,Th,Sa-HD Kris Mouton, RPH      . Vitamin D (Ergocalciferol) (DRISDOL) capsule 50,000 Units  50,000 Units Oral Weekly Reubin Milan, MD   50,000 Units at 12/15/17 [phone]  . Warfarin - Pharmacist Dosing Inpatient   Does not apply Q24H Reubin Milan, MD        ROS:   General:  No weight loss, Fever, chills  HEENT: No recent headaches, no nasal bleeding, no visual changes, no sore throat  Neurologic: No dizziness, blackouts, seizures. No recent symptoms of stroke or mini- stroke. No recent  episodes of slurred speech, or temporary blindness.  Cardiac: No recent episodes of chest pain/pressure, no shortness of breath at rest.  No shortness of breath with exertion.  Denies history of atrial fibrillation or irregular heartbeat  Vascular: No history of rest pain in feet.  No history of claudication.  No history of non-healing ulcer, No history of DVT   Pulmonary: No home oxygen, no productive cough, no hemoptysis,  No asthma or wheezing  Musculoskeletal:  [ ]  Arthritis, [ ]  Low back pain,  [ ]  Joint pain  Hematologic:No history of hypercoagulable state.  No history of easy bleeding.  No history of anemia  Gastrointestinal: No hematochezia or melena,  No gastroesophageal reflux, no trouble swallowing  Urinary: [X]  chronic Kidney disease, [X]  on HD - [ ]  MWF or [X]  TTHS, [ ]  Burning with urination, [ ]  Frequent urination, [ ]  Difficulty urinating;   Skin: No rashes  Psychological: No history of anxiety,  No history of depression   Physical Examination  Vitals:   12/16/17 1200 12/16/17 1227 12/16/17 1338 12/16/17 1632  BP: (!) 151/61 (!) 163/92 (!) 131/44 (!) 145/39  Pulse: 95 93 99 78  Resp:  18 17 18   Temp:  97.6 F (36.4 C) (!) 97.5 F (36.4 C) 98.5 F (36.9 C)  TempSrc:  Oral Oral Oral  SpO2:  96% 100% 95%  Weight:  168 lb 3.4 oz (76.3 kg) 168 lb 3.4 oz (76.3 kg)   Height:  Body mass index is 27.99 kg/m.  General:  Alert and oriented, no acute distress HEENT: Normal Neck: No bruit or JVD Pulmonary: Clear to auscultation bilaterally Cardiac: Irregular Abdomen: Soft, non-tender, non-distended Skin: No rash, 8 x 3 cm necrotic right calf wound dark eshar Extremity Pulses:  2+ radial, brachial, femoral, absent dorsalis pedis, posterior tibial pulses bilaterally, feet warm bilaterally Musculoskeletal: No deformity or edema  Neurologic: Upper and lower extremity motor 5/5 and symmetric  DATA: prior duplex in our office shows SFA stenosis  ASSESSMENT:   Right calf wound at risk for amputation.  Calf wounds are usually not related to short SFA occlusion so would continue to look for underlying cause ? calciphylaxis   PLAN:  Aortogram with bilateral runoff Friday possible intervention.  Procedure risk benefit discussed with pt.  Hold anticoagulation.  Need INR less than 1.7   [name], MD [facility] Office: [phone] Pager: [phone]

## 2017-12-16 NOTE — Progress Notes (Signed)
PROGRESS NOTE  [name] TDD:[id number] DOB: 11-23-1945 DOA: 12/14/2017 PCP: Sharilyn Sites, MD  Brief History:  72 year old female with a history of ESRD, COPD, atrial fibrillation, lupus, diabetes mellitus type 2, hypertension, DVT right upper extremity, presenting from her PCPs office secondary to worsening of chronic right diabetic foot ulceration with concerns of cellulitis of the right calf.  The patient states that she has had wounds in her bilateral lower extremities for several months.  She denies any recent injury.  The patient had been following up by the wound care center but stopped going in January 2019 due to a change of her health insurance. Since then, she has noticed worsening of the wound and has been having worsening pain for the past few days.    In the emergency department, the patient was noted to have WBC 12.7, but she was afebrile hemodynamically stable.  X-rays of the right foot showed possible tiny erosions at the tops of the second and third distal phalanges.  The patient was started on Vancomycin Zosyn admitted for further evaluation. September 03, 2017, she had a lower extremity arterial duplex evaluation showed a patent right superficial femoral artery with velocities suggestive of a 75-99% stenosis involving the distal segment.  Patent left lower extremity arterial system without evidence of hemodynamically significant stenosis  Assessment/Plan: Cellulitis right lower extremity -MRI right lower extremity negative for osteomyelitis or abscess -failed outpt doxycycline--recently finished 10 days doxy on 12/12/17 -Continue vancomycin -Discontinue Zosyn -Start ceftriaxone -ESR 80 -CRP 2.1 -concerned vascular insufficiency is more the problem than cellulitis as her wound is worse when compared to picture by Dr. Oneida Alar on 10/15/17 Dr Oneida Alar is aware of pt being here at [facility] Will await recommendation   Diabetic foot ulcer/Peripheral arterial disease -Wound  care consult -09/03/17--arterial duplex--patent right superficial femoral artery with velocities suggestive of a 75-99% stenosis involving the distal segment -follows Dr. Oneida Alar -Transfered to [facility] as discussed above  Atrial fibrillation -Rate controlled -Continue Coumadin -Continue metoprolol tartrate  ESRD -Consult nephrology for maintenance dialysis -Metabolic bone disease per nephrology  Essential hypertension, uncontrolled -Continue metoprolol tartrate change dosing to twice daily -hold dosing prior to dialysis  Systemic lupus erythematosus -continue Plaquenil and prednisone  Diabetes mellitus type 2, hypoglycemic uncontrolled -hemoglobin A1c 5.8 -Holding Levemir secondary to hypoglycemia -NovoLog sliding scale  Hyperlipidemia -Continue statin  COPD -stable on RA    Disposition Plan:  Await recommendation from vascular surgery, PTOT  Family Communication:  Spouse updated  Consultants:  Renal, VVS  Code Status:  FULL   DVT Prophylaxis:  [place] Heparin    Procedures: As Listed in Progress Note Above  Antibiotics: vanco 3/18>>> Zosyn 3/18    Subjective: Patient states she has some stomach pain, no nausea no vomiting  Objective: Vitals:   12/16/17 1130 12/16/17 1200 12/16/17 1338 12/16/17 1632  BP: (!) 149/51 (!) 151/61 (!) 131/44 (!) 145/39  Pulse: 87 95 99 78  Resp:   17 18  Temp:   (!) 97.5 F (36.4 C) 98.5 F (36.9 C)  TempSrc:   Oral Oral  SpO2:   100% 95%  Weight:   76.3 kg (168 lb 3.4 oz)   Height:        Intake/Output Summary (Last 24 hours) at 12/16/2017 1644 Last data filed at 12/16/2017 1400 Gross per 24 hour  Intake 240 ml  Output 200 ml  Net 40 ml   Weight change: -2.494 kg (-8 oz)  Exam:   General:  Pt is alert, follows commands appropriately, not in acute distress  HEENT: No icterus, No thrush, No neck mass, [place]/AT  Cardiovascular: RRR, S1/S2, no rubs, no gallops  Respiratory: CTA bilaterally, no wheezing, no  crackles, no rhonchi  Abdomen: Soft/+BS, non tender, non distended, no guarding  Extremities: Erythema on the posterior calf of the right lower extremity with eschar lesion.  See picture below.  Right posterior calf   Data Reviewed: I have personally reviewed following labs and imaging studies Basic Metabolic Panel: Recent Labs  Lab 12/14/17 1630 12/15/17 1758 12/16/17 0637  NA 138 136 137  K 4.0 4.4 3.9  CL 98* 102 102  CO2 _0 GLUCOSE 67 208* 133*  BUN 41* 47* 52*  CREATININE 4.17* 3.92* 3.76*  CALCIUM 9.3 9.1 9.1  PHOS  --  2.2* 2.1*   Liver Function Tests: Recent Labs  Lab 12/14/17 1630 12/15/17 1758 12/16/17 0637  AST 21  --   --   ALT 18  --   --   ALKPHOS 76  --   --   BILITOT 0.4  --   --   PROT 7.1  --   --   ALBUMIN 2.7* 2.3* 2.2*   No results for input(s): LIPASE, AMYLASE in the last 168 hours. No results for input(s): AMMONIA in the last 168 hours. Coagulation Profile: Recent Labs  Lab 12/14/17 1630 12/15/17 0518 12/16/17 0637  INR 1.87 1.74 1.83   CBC: Recent Labs  Lab 12/14/17 1630 12/15/17 0518 12/15/17 1758 12/16/17 0637  WBC 12.7* 8.7 9.5 8.5  NEUTROABS 10.1* 6.4  --  6.4  HGB 9.7* 8.6* 9.0* 8.6*  HCT 32.9* 29.0* 29.8* 28.7*  MCV 96.8 96.0 95.8 94.4  PLT 266 228 219 191   Cardiac Enzymes: No results for input(s): CKTOTAL, CKMB, CKMBINDEX, TROPONINI in the last 168 hours. BNP: Invalid input(s): POCBNP CBG: Recent Labs  Lab 12/15/17 1122 12/15/17 1457 12/15/17 1640 12/15/17 2011 12/16/17 1336  GLUCAP 173* 201* 174* 177* 101*   HbA1C: Recent Labs    12/15/17 0518  HGBA1C 5.8*   Urine analysis:    Component Value Date/Time   COLORURINE AMBER (A) 11/19/2017 0300   APPEARANCEUR HAZY (A) 11/19/2017 0300   LABSPEC 1.021 11/19/2017 0300   PHURINE 5.0 11/19/2017 0300   GLUCOSEU NEGATIVE 11/19/2017 0300   HGBUR NEGATIVE 11/19/2017 0300   BILIRUBINUR NEGATIVE 11/19/2017 0300   KETONESUR NEGATIVE 11/19/2017 0300     PROTEINUR NEGATIVE 11/19/2017 0300   UROBILINOGEN 1.0 03/17/2014 1830   NITRITE NEGATIVE 11/19/2017 0300   LEUKOCYTESUR MODERATE (A) 11/19/2017 0300   Sepsis Labs: _1 (procalcitonin:4,lacticidven:4) ) Recent Results (from the past 240 hour(s))  Culture, blood (Routine x 2)     Status: None (Preliminary result)   Collection Time: 12/14/17  4:30 PM  Result Value Ref Range Status   Specimen Description RIGHT ANTECUBITAL  Final   Special Requests   Final    BOTTLES DRAWN AEROBIC AND ANAEROBIC Blood Culture adequate volume   Culture   Final    NO GROWTH 2 DAYS Performed at [facility], [address] [place], [place] [address]    Report Status PENDING  Incomplete  Culture, blood (Routine x 2)     Status: None (Preliminary result)   Collection Time: 12/14/17  4:31 PM  Result Value Ref Range Status   Specimen Description BLOOD RIGHT HAND  Final   Special Requests   Final    BOTTLES DRAWN AEROBIC  AND ANAEROBIC Blood Culture adequate volume   Culture   Final    NO GROWTH 2 DAYS Performed at [facility], [address] [place], [place] [address]    Report Status PENDING  Incomplete  MRSA PCR Screening     Status: None   Collection Time: 12/15/17  9:15 AM  Result Value Ref Range Status   MRSA by PCR NEGATIVE NEGATIVE Final    Comment:        The GeneXpert MRSA Assay (FDA approved for NASAL specimens only), is one component of a comprehensive MRSA colonization surveillance program. It is not intended to diagnose MRSA infection nor to guide or monitor treatment for MRSA infections. Performed at [facility], [address], [place], [place] [address]      Scheduled Meds: . atorvastatin  20 mg Oral Daily  . timolol  1 drop Left Eye Daily   And  . brimonidine  1 drop Left Eye Daily  . [START ON 12/17/2017] calcitRIOL  0.25 mcg Oral Q T,Th,Sa-HD  . calcium acetate  1,334 mg Oral TID WC  . darbepoetin (ARANESP) injection - DIALYSIS  100 mcg Intravenous Q Wed-HD  .  dicyclomine  10 mg Oral TID AC & HS  . febuxostat  40 mg Oral Daily  . feeding supplement (NEPRO CARB STEADY)  237 mL Oral Q24H  . folic acid  1 mg Oral Daily  . hydroxychloroquine  300 mg Oral Daily  . insulin aspart  0-9 Units Subcutaneous TID WC  . ketorolac  1 drop Left Eye QID  . latanoprost  1 drop Left Eye QHS  . metoprolol tartrate  25 mg Oral Q T,Th,Sat-1800  . metoprolol tartrate  50 mg Oral 2 times per day on Sun Mon Wed Fri  . nutrition supplement (JUVEN)  1 packet Oral BID BM  . predniSONE  5 mg Oral Q breakfast  . Vitamin D (Ergocalciferol)  50,000 Units Oral Weekly  . warfarin  5 mg Oral ONCE-1800  . Warfarin - Pharmacist Dosing Inpatient   Does not apply Q24H   Continuous Infusions: . [START ON 12/17/2017] ferric gluconate (FERRLECIT/NULECIT) IV    . vancomycin      Procedures/Studies: Dg Chest 2 View  Result Date: 12/14/2017 CLINICAL DATA:  History of leg wound EXAM: CHEST - 2 VIEW COMPARISON:  11/03/2017 FINDINGS: Cardiac shadow is mildly prominent but likely related to the underlying dialysis state. Dialysis catheter is noted on the right and stable. The lungs are well aerated bilaterally without focal infiltrate or sizable effusion. No bony abnormality is seen. IMPRESSION: No active cardiopulmonary disease. Electronically Signed   By: Inez Catalina M.D.   On: 12/14/2017 16:19   Dg Tibia/fibula Right  Result Date: 12/14/2017 CLINICAL DATA:  Right leg ulcer EXAM: RIGHT TIBIA AND FIBULA - 2 VIEW COMPARISON:  11/02/2017 FINDINGS: No acute displaced fracture or malalignment. Fixating screw in the distal fibula with old fibular shaft fracture deformity. Soft tissue ulcer defect posterior mid lower leg. No soft tissue gas. Vascular calcifications and soft tissue calcifications. IMPRESSION: 1. No acute osseous abnormality 2. Old fracture deformity of the distal fibula 3. Small ulcer posterior soft tissues of the mid lower leg. Electronically Signed   By: Donavan Foil M.D.   On:  12/14/2017 17:08   Mr Foot Right Wo Contrast  Result Date: 12/15/2017 CLINICAL DATA:  Skin ulcerations since the fall of 2018 on the right second and third toes in a diabetic patient with end-stage renal disease. EXAM: MRI  OF THE RIGHT FOREFOOT WITHOUT CONTRAST TECHNIQUE: Multiplanar, multisequence MR imaging of the right forefoot was performed. No intravenous contrast was administered. COMPARISON:  Plain films right foot 12/14/2017. FINDINGS: This exam is degraded by patient motion. Bones/Joint/Cartilage No bone marrow edema to suggest osteomyelitis is identified. No fracture is seen. There is some degenerative change about the first MTP joint. Ligaments Intact. Muscles and Tendons Mild intrasubstance increased T2 signal in intrinsic musculature is most consistent with diabetic myopathy. No intramuscular fluid collection. Soft tissues Negative. The patient's skin ulcerations are not visible likely due to motion. IMPRESSION: Negative for abscess or osteomyelitis. Electronically Signed   By: Inge Rise M.D.   On: 12/15/2017 08:36   Dg Abd Portable 1v  Result Date: 12/16/2017 CLINICAL DATA:  Lower abdominal pain for several weeks EXAM: PORTABLE ABDOMEN - 1 VIEW COMPARISON:  11/02/2017 FINDINGS: Scattered large and small bowel gas is noted. Mild retained fecal material is seen. No obstructive changes are noted. No free air is noted. Degenerative changes of the lumbar spine are again seen. IMPRESSION: No acute abnormality noted. Electronically Signed   By: Inez Catalina M.D.   On: 12/16/2017 15:05   Dg Foot Complete Right  Result Date: 12/14/2017 CLINICAL DATA:  Blister at right middle toe for 1 month EXAM: RIGHT FOOT COMPLETE - 3+ VIEW COMPARISON:  07/10/2016 FINDINGS: No acute displaced fracture or malalignment. Moderate degenerative changes at the first MTP joint. Vascular calcifications. Possible tiny erosions at the distal phalanges of the second and third digits. No soft tissue gas. No radiopaque  foreign body. Moderate plantar calcaneal spur with plantar soft tissue calcifications. IMPRESSION: 1. No acute fracture or malalignment 2. Possible tiny erosions at the tufts of the second and third distal phalanges. If osteomyelitis is suspected, would correlate with MRI. Electronically Signed   By: Donavan Foil M.D.   On: 12/14/2017 17:11    Berle Mull, [facility]  If 7PM-7AM, please contact night-coverage [email or web address] Password [email or web address] 12/16/2017, 4:44 PM   LOS: 2 days

## 2017-12-16 NOTE — Progress Notes (Signed)
ANTICOAGULATION CONSULT NOTE - Follow up [place] for Warfarin Indication: atrial fibrillation  Allergies  Allergen Reactions  . Ace Inhibitors Cough    Patient Measurements: Height: 5\' 5"  (165.1 cm) Weight: 171 lb 8.3 oz (77.8 kg) IBW/kg (Calculated) : 57  Vital Signs: Temp: 97.9 F (36.6 C) (03/20 0803) Temp Source: Oral (03/20 0803) BP: 166/59 (03/20 0830) Pulse Rate: 83 (03/20 0830)  Labs: Recent Labs    12/14/17 1630 12/15/17 0518 12/15/17 1758 12/16/17 0637  HGB 9.7* 8.6* 9.0* 8.6*  HCT 32.9* 29.0* 29.8* 28.7*  PLT 266 228 219 191  LABPROT 21.4* 20.2*  --  21.0*  INR 1.87 1.74  --  1.83  CREATININE 4.17*  --  3.92* 3.76*   Estimated Creatinine Clearance: 13.9 mL/min (A) (by C-G formula based on SCr of 3.76 mg/dL (H)).  Medical History: Past Medical History:  Diagnosis Date  . Anemia   . Arthritis   . Asthma   . Atrial fibrillation ([place])   . Atrial flutter [facility])    TEE/DCCV December 2013 - on Xarelto [facility])  . Bone spur of toe    Right 5th toe  . Chronic diastolic CHF (congestive heart failure) (HCC)    a. EF 50-55% by echo in 2015  . CKD (chronic kidney disease) stage 4, GFR 15-29 ml/min (HCC)    a. s/p fistula placement but not on HD. Followed by Nephrology.   . Colon polyposis   . COPD (chronic obstructive pulmonary disease) ([place])   . DVT of upper extremity (deep vein thrombosis) ([place])    Right basilic vein, June 4010  . Essential hypertension, benign   . Fractures   . Glaucoma   . SLE (systemic lupus erythematosus) ([place])   . Type 2 diabetes mellitus ([place])   . UTI (lower urinary tract infection)    Medications:  Medications Prior to Admission  Medication Sig Dispense Refill Last Dose  . acetaminophen (TYLENOL) 500 MG tablet Take 1,000 mg by mouth every 6 (six) hours as needed. For pain   12/13/2017 at Unknown time  . albuterol (ACCUNEB) 0.63 MG/3ML nebulizer solution Take 1 ampule by nebulization every 6 (six) hours as needed  for wheezing or shortness of breath.    unknown  . albuterol (PROVENTIL HFA;VENTOLIN HFA) 108 (90 BASE) MCG/ACT inhaler Inhale 2 puffs into the lungs every 6 (six) hours as needed. For shortness of breath   unknown  . atorvastatin (LIPITOR) 20 MG tablet Take 20 mg by mouth daily.   12/13/2017 at Unknown time  . brimonidine-timolol (COMBIGAN) 0.2-0.5 % ophthalmic solution Place 1 drop into the left eye daily.    12/13/2017 at Unknown time  . calcium acetate (PHOSLO) 667 MG capsule Take 2 capsules (1,334 mg total) by mouth 3 (three) times daily with meals. 90 capsule 0 12/14/2017 at Unknown time  . collagenase (SANTYL) ointment Apply 1 application topically daily.   12/14/2017 at Unknown time  . diclofenac (VOLTAREN) 0.1 % ophthalmic solution Place 1 drop into the left eye daily. Uses at lunch time   12/14/2017 at Unknown time  . dicyclomine (BENTYL) 10 MG capsule Take 10 mg by mouth 4 (four) times daily -  before meals and at bedtime.   12/14/2017 at Unknown time  . febuxostat (ULORIC) 40 MG tablet Take 40 mg by mouth daily.   12/14/2017 at Unknown time  . folic acid (FOLVITE) 1 MG tablet Take 1 mg by mouth daily.   12/14/2017 at Unknown time  . HYDROcodone-acetaminophen (  NORCO/VICODIN) 5-325 MG tablet Take 1-2 tablets by mouth every 8 (eight) hours as needed for moderate pain. 60 tablet 0 Past Week at Unknown time  . hydroxychloroquine (PLAQUENIL) 200 MG tablet Take 300 mg by mouth as directed. Take 1 1/2 tablets daily   12/14/2017 at Unknown time  . Insulin Detemir (LEVEMIR FLEXTOUCH) 100 UNIT/ML Pen Inject 7 Units into the skin daily at 10 pm.   12/13/2017 at Unknown time  . insulin lispro (HUMALOG KWIKPEN) 100 UNIT/ML KiwkPen Give per sliding scale from 2-10 units subcutaneous three times a day.   12/14/2017 at Unknown time  . iron polysaccharides (NIFEREX) 150 MG capsule Take 150 mg by mouth 3 (three) times daily.    12/14/2017 at Unknown time  . metoprolol tartrate (LOPRESSOR) 50 MG tablet Take 50 mg by  mouth 2 (two) times daily. Take 1 tablet twice a day, except on Tuesday, Thursday, Saturday, take 1 tablet daily in the morning.   12/14/2017 at 0830  . Multiple Vitamins-Minerals (CENTRUM SILVER PO) Take 1 tablet by mouth daily.   12/14/2017 at Unknown time  . predniSONE (DELTASONE) 5 MG tablet Take 5 mg by mouth daily with breakfast.   12/14/2017 at Unknown time  . travoprost, benzalkonium, (TRAVATAN) 0.004 % ophthalmic solution Place 1 drop into the left eye at bedtime.   unknown  . Vitamin D, Ergocalciferol, (DRISDOL) 50000 units CAPS capsule TAKE 1 CAPSULE BY MOUTH ONCE WEEKLY. 12 capsule 0 Past Week at Unknown time  . warfarin (COUMADIN) 4 MG tablet Take 4 mg by mouth daily.   12/13/2017 at 1700     Home dose is 4mg  daily.  Assessment: Okay for Protocol, no bleeding noted. History of ESRD, COPD, atrial fibrillation, lupus, diabetes mellitus type 2, hypertension, DVT right upper extremity, presenting from her PCPs office secondary to worsening of chronic right diabetic foot ulceration with concerns of cellulitis of the right calf.  INR 1.74 at PCP office on 3/19 and now 1.83 on 3/20. Below goal and just slightly higher than yesterday AM.   Goal of Therapy:  INR 2-3   Plan:  Warfarin 5mg  PO x 1. Daily PT / INR Monitor for signs and symptoms of bleeding.   Joselyn Glassman, PharmD, [place] 12/16/17, [phone] AM

## 2017-12-16 NOTE — Progress Notes (Addendum)
[facility] Progress Note   Dialysis Orders: TTS 4 hr Reids 400/800 3 K 2.5 Ca EDW 78n right IJ TDC std Heparin, calcitriol 0.25 Mircera 50 last 3/5 --review of records show she has been pesenting below edw for at least several treatments with post HD wt 75.9 b3/16 and a net UF of 0.7 with her lowest BP being 127/44.  Assessment/Plan: 1. Bilateral LE chronic wounds with worsening right LE calf wound/mod SFA occlusive disease right leg - per primary/continuing abtx 2. ESRD -  TTS - postponed HD until first round Wed due to high patient volume. - Apparently has first stage BVT placed in 2015 with [facility] and never followed up with this although has been to [facility] for other issues. Needs f/u after d/c Back on schedule tomorrow with a slightly shorter HD. 3. Hypertension/volume  - needs lower edw at d/c- has been leaving below edw. BP down given bolus of fluid high pre HD came down with treatment; MTP bid- if BP remains up -may need to add back low dose amlodipine vs lowering edw 4. Anemia  - hgb 8.6 - resumed ESA Aranesp 100 -given today off schedule due to  - noted to have heme + stools 2/23 during ED visit - on chroniccoumadin d/c niferex  Give weekly Fe - ^ ferritin likely due to chronic wound 5.  Metabolic bone disease -  Continue calcitriol/binders 6.  Nutrition -renal carb mod/vits/nepro 7.  Afib - on chronic coumadin/MTP  8. SLE 9. DM - per primary   Myriam Jacobson, PA-C [place] [phone] 12/16/2017,10:30 AM  LOS: 2 days   Pt seen, examined and agree w A/P as above.  Kelly Splinter MD [facility] pager [phone]   12/16/2017, 12:45 PM    Subjective:   C/o right sided abdominal pain since before dialysis started.  Objective Vitals:   12/16/17 0803 12/16/17 0809 12/16/17 0830 12/16/17 0900  BP: (!) 188/58 (!) 191/67 (!) 166/59 (!) 151/51  Pulse: 85 80 83 86  Resp: 18     Temp: 97.9 F (36.6 C)     TempSrc: Oral     SpO2:  99%     Weight: 77.8 kg (171 lb 8.3 oz)     Height:       Physical Exam General: ill appearing Heart: RRR Lungs: no rales Abdomen: soft right sided tenderness + BS Extremities: right calf wound- no sig LE edema Dialysis Access:  Left BVT stage one -+ bruit - and right IJ [facility]   Additional Objective Labs: Basic Metabolic Panel: Recent Labs  Lab 12/14/17 1630 12/15/17 1758 12/16/17 0637  NA 138 136 137  K 4.0 4.4 3.9  CL 98* 102 102  CO2 28 24 26   GLUCOSE 67 208* 133*  BUN 41* 47* 52*  CREATININE 4.17* 3.92* 3.76*  CALCIUM 9.3 9.1 9.1  PHOS  --  2.2* 2.1*   Liver Function Tests: Recent Labs  Lab 12/14/17 1630 12/15/17 1758 12/16/17 0637  AST 21  --   --   ALT 18  --   --   ALKPHOS 76  --   --   BILITOT 0.4  --   --   PROT 7.1  --   --   ALBUMIN 2.7* 2.3* 2.2*   No results for input(s): LIPASE, AMYLASE in the last 168 hours. CBC: Recent Labs  Lab 12/14/17 1630 12/15/17 0518 12/15/17 1758 12/16/17 0637  WBC 12.7* 8.7 9.5 8.5  NEUTROABS 10.1* 6.4  --  6.4  HGB 9.7* 8.6* 9.0* 8.6*  HCT 32.9* 29.0* 29.8* 28.7*  MCV 96.8 96.0 95.8 94.4  PLT 266 228 219 191   Blood Culture    Component Value Date/Time   SDES BLOOD RIGHT HAND 12/14/2017 1631   SPECREQUEST  12/14/2017 1631    BOTTLES DRAWN AEROBIC AND ANAEROBIC Blood Culture adequate volume   CULT  12/14/2017 1631    NO GROWTH 2 DAYS Performed at [facility], [address], [place], [place] [address]    REPTSTATUS PENDING 12/14/2017 1631    Cardiac Enzymes: No results for input(s): CKTOTAL, CKMB, CKMBINDEX, TROPONINI in the last 168 hours. CBG: Recent Labs  Lab 12/15/17 0810 12/15/17 1122 12/15/17 1457 12/15/17 1640 12/15/17 2011  GLUCAP 118* 173* 201* 174* 177*   Iron Studies: No results for input(s): IRON, TIBC, TRANSFERRIN, FERRITIN in the last 72 hours. Lab Results  Component Value Date   INR 1.83 12/16/2017   INR 1.74 12/15/2017   INR 1.87 12/14/2017   Studies/Results: Dg Chest 2  View  Result Date: 12/14/2017 CLINICAL DATA:  History of leg wound EXAM: CHEST - 2 VIEW COMPARISON:  11/03/2017 FINDINGS: Cardiac shadow is mildly prominent but likely related to the underlying dialysis state. Dialysis catheter is noted on the right and stable. The lungs are well aerated bilaterally without focal infiltrate or sizable effusion. No bony abnormality is seen. IMPRESSION: No active cardiopulmonary disease. Electronically Signed   By: Inez Catalina M.D.   On: 12/14/2017 16:19   Dg Tibia/fibula Right  Result Date: 12/14/2017 CLINICAL DATA:  Right leg ulcer EXAM: RIGHT TIBIA AND FIBULA - 2 VIEW COMPARISON:  11/02/2017 FINDINGS: No acute displaced fracture or malalignment. Fixating screw in the distal fibula with old fibular shaft fracture deformity. Soft tissue ulcer defect posterior mid lower leg. No soft tissue gas. Vascular calcifications and soft tissue calcifications. IMPRESSION: 1. No acute osseous abnormality 2. Old fracture deformity of the distal fibula 3. Small ulcer posterior soft tissues of the mid lower leg. Electronically Signed   By: Donavan Foil M.D.   On: 12/14/2017 17:08   Mr Foot Right Wo Contrast  Result Date: 12/15/2017 CLINICAL DATA:  Skin ulcerations since the fall of 2018 on the right second and third toes in a diabetic patient with end-stage renal disease. EXAM: MRI OF THE RIGHT FOREFOOT WITHOUT CONTRAST TECHNIQUE: Multiplanar, multisequence MR imaging of the right forefoot was performed. No intravenous contrast was administered. COMPARISON:  Plain films right foot 12/14/2017. FINDINGS: This exam is degraded by patient motion. Bones/Joint/Cartilage No bone marrow edema to suggest osteomyelitis is identified. No fracture is seen. There is some degenerative change about the first MTP joint. Ligaments Intact. Muscles and Tendons Mild intrasubstance increased T2 signal in intrinsic musculature is most consistent with diabetic myopathy. No intramuscular fluid collection. Soft  tissues Negative. The patient's skin ulcerations are not visible likely due to motion. IMPRESSION: Negative for abscess or osteomyelitis. Electronically Signed   By: Inge Rise M.D.   On: 12/15/2017 08:36   Dg Foot Complete Right  Result Date: 12/14/2017 CLINICAL DATA:  Blister at right middle toe for 1 month EXAM: RIGHT FOOT COMPLETE - 3+ VIEW COMPARISON:  07/10/2016 FINDINGS: No acute displaced fracture or malalignment. Moderate degenerative changes at the first MTP joint. Vascular calcifications. Possible tiny erosions at the distal phalanges of the second and third digits. No soft tissue gas. No radiopaque foreign body. Moderate plantar calcaneal spur with plantar soft tissue calcifications. IMPRESSION: 1. No acute fracture or malalignment  2. Possible tiny erosions at the tufts of the second and third distal phalanges. If osteomyelitis is suspected, would correlate with MRI. Electronically Signed   By: Donavan Foil M.D.   On: 12/14/2017 17:11   Medications: . sodium chloride    . sodium chloride    . vancomycin    . vancomycin     . atorvastatin  20 mg Oral Daily  . timolol  1 drop Left Eye Daily   And  . brimonidine  1 drop Left Eye Daily  . [START ON 12/17/2017] calcitRIOL  0.25 mcg Oral Q T,Th,Sa-HD  . calcium acetate  1,334 mg Oral TID WC  . darbepoetin (ARANESP) injection - DIALYSIS  100 mcg Intravenous Q Wed-HD  . dicyclomine  10 mg Oral TID AC & HS  . febuxostat  40 mg Oral Daily  . feeding supplement (NEPRO CARB STEADY)  237 mL Oral Q24H  . folic acid  1 mg Oral Daily  . hydroxychloroquine  300 mg Oral Daily  . insulin aspart  0-9 Units Subcutaneous TID WC  . ketorolac  1 drop Left Eye QID  . latanoprost  1 drop Left Eye QHS  . metoprolol tartrate  25 mg Oral Q T,Th,Sat-1800  . metoprolol tartrate  50 mg Oral 2 times per day on Sun Mon Wed Fri  . nutrition supplement (JUVEN)  1 packet Oral BID BM  . oxyCODONE      . predniSONE  5 mg Oral Q breakfast  . Vitamin D  (Ergocalciferol)  50,000 Units Oral Weekly  . warfarin  5 mg Oral ONCE-1800  . Warfarin - Pharmacist Dosing Inpatient   Does not apply Q24H

## 2017-12-17 LAB — PROTIME-INR
INR: 1.99
PROTHROMBIN TIME: 22.5 s — AB (ref 11.4–15.2)

## 2017-12-17 LAB — CBC
HCT: 28.1 % — ABNORMAL LOW (ref 36.0–46.0)
HEMOGLOBIN: 8.7 g/dL — AB (ref 12.0–15.0)
MCH: 29.2 pg (ref 26.0–34.0)
MCHC: 31 g/dL (ref 30.0–36.0)
MCV: 94.3 fL (ref 78.0–100.0)
PLATELETS: 220 10*3/uL (ref 150–400)
RBC: 2.98 MIL/uL — AB (ref 3.87–5.11)
RDW: 17.4 % — ABNORMAL HIGH (ref 11.5–15.5)
WBC: 8.3 10*3/uL (ref 4.0–10.5)

## 2017-12-17 LAB — RENAL FUNCTION PANEL
ALBUMIN: 2.2 g/dL — AB (ref 3.5–5.0)
ANION GAP: 9 (ref 5–15)
BUN: 30 mg/dL — AB (ref 6–20)
CALCIUM: 8.8 mg/dL — AB (ref 8.9–10.3)
CO2: 28 mmol/L (ref 22–32)
CREATININE: 2.75 mg/dL — AB (ref 0.44–1.00)
Chloride: 97 mmol/L — ABNORMAL LOW (ref 101–111)
GFR calc Af Amer: 19 mL/min — ABNORMAL LOW (ref >60)
GFR calc non Af Amer: 16 mL/min — ABNORMAL LOW (ref >60)
GLUCOSE: 114 mg/dL — AB (ref 65–99)
PHOSPHORUS: 1.7 mg/dL — AB (ref 2.5–4.6)
Potassium: 3.5 mmol/L (ref 3.5–5.1)
SODIUM: 134 mmol/L — AB (ref 135–145)

## 2017-12-17 LAB — SURGICAL PCR SCREEN
MRSA, PCR: NEGATIVE
Staphylococcus aureus: NEGATIVE

## 2017-12-17 LAB — GLUCOSE, CAPILLARY
Glucose-Capillary: 78 mg/dL (ref 65–99)
Glucose-Capillary: 154 mg/dL — ABNORMAL HIGH (ref 65–99)
Glucose-Capillary: 191 mg/dL — ABNORMAL HIGH (ref 65–99)

## 2017-12-17 MED ORDER — OXYCODONE HCL 5 MG PO TABS
ORAL_TABLET | ORAL | Status: AC
  Filled 2017-12-17: qty 1

## 2017-12-17 MED ORDER — VANCOMYCIN HCL IN DEXTROSE 1-5 GM/200ML-% IV SOLN
INTRAVENOUS | Status: AC
  Administered 2017-12-17: 1000 mg via INTRAVENOUS
  Filled 2017-12-17: qty 200

## 2017-12-17 MED ORDER — PRO-STAT SUGAR FREE PO LIQD
30.0000 mL | Freq: Two times a day (BID) | ORAL | Status: DC
  Administered 2017-12-17 – 2017-12-29 (×18): 30 mL via ORAL
  Filled 2017-12-17 (×23): qty 30

## 2017-12-17 MED ORDER — VITAMIN K1 10 MG/ML IJ SOLN
5.0000 mg | Freq: Once | INTRAVENOUS | Status: AC
  Administered 2017-12-17: 5 mg via INTRAVENOUS
  Filled 2017-12-17: qty 0.5

## 2017-12-17 MED ORDER — CALCIUM ACETATE (PHOS BINDER) 667 MG PO CAPS
667.0000 mg | ORAL_CAPSULE | Freq: Three times a day (TID) | ORAL | Status: DC
  Administered 2017-12-17 – 2017-12-18 (×3): 667 mg via ORAL
  Filled 2017-12-17 (×6): qty 1

## 2017-12-17 NOTE — H&P (View-Only) (Signed)
Called husband and updated him on condition and plan for arteriogram tomorrow  NPO p midnight  Agram with bilat runoff possible intervention tomorrow  [name], MD [facility] [place] Office: [phone] Pager: [phone]

## 2017-12-17 NOTE — Progress Notes (Addendum)
[facility] Progress Note   Dialysis Orders:  TTS 4 hr Reids 400/800 3 K 2.5 Ca EDW 78n right IJ TDC std Heparin, calcitriol 0.25 Mircera 50 last 3/5 --review of records show she has been pesenting below edw for at least several treatments with post HD wt 75.9 b3/16 and a net UF of 0.7 with her lowest BP being 127/44.  Assessment/Plan: 1. Bilateral LE chronic wounds with worsening right LE calf wound/mod SFA occlusive disease right leg - per primary/continuing abtx- [facility]; no + culture yet;  for arteriogram by [facility] Friday if INR gets low enougth 2. ESRD- TTS - postponed HD until first round Wed due to high patient volume. - Apparently has first stage BVT placed in 2015 recent f/u regarding this;  Will ask [facility] to eval while here and plan for next stage even if it cannot be done during this admission.  Labs pending 3. Hypertension/volume- needs lower edw at d/c- has been leaving below edw. BP down given bolus of fluid high pre HD came down with treatment; MTP bid- if BP remains up -may need to add back low dose amlodipine vs lowering edw- net UF 1.6 - unable to do standing wt pre HD - goal reduced today to 1.5 - as BP in the low 100s now. 4. Anemia- hgb 8.6 - resumed ESA Aranesp 100 -given 3/20 off schedule - noted to have heme + stools 2/23 during ED visit - on chroniccoumadind/c niferex Give weekly Fe - ^ ferritin likely due to chronic wound 5. Metabolic bone disease- Continue calcitriol- reduced phoslo to 1 ac tid due to low P- may want to consider a nonCa based binder 6. Nutrition-renal carb mod/vits/nepro  alb 2.2 - add prostat 7. Afib- on chronic coumadin/MTP 8. SLE 9. DM- per primary  Dana Jacobson, PA-C [place] [phone] 12/17/2017,8:52 AM  LOS: 3 days   Pt seen, examined and agree w A/P as above.  Kelly Splinter MD [facility] pager [phone]   12/17/2017, 11:09 AM    Subjective:   No c/o except RLE  sore  Objective Vitals:   12/17/17 0742 12/17/17 0749 12/17/17 0800 12/17/17 0830  BP: (!) (P) 160/55 (!) (P) 144/73 (!) 153/48 (!) (P) 106/38  Pulse: (P) 73 70 72 (P) 68  Resp: (P) 18     Temp: (P) 98.2 F (36.8 C)     TempSrc: (P) Oral     SpO2: (P) 96%     Weight: (P) 75.8 kg (167 lb 1.7 oz)     Height:       Physical Exam General: NAD Heart: RRR Lungs: no rales Abdomen: soft NT Extremities: no LE edema, wound stable left calf Dialysis Access: right IJ [facility]    Additional Objective Labs: Lab Results  Component Value Date   INR 1.99 12/17/2017   INR 1.83 12/16/2017   INR 1.74 15/40/0867    Basic Metabolic Panel: Recent Labs  Lab 12/14/17 1630 12/15/17 1758 12/16/17 0637  NA 138 136 137  K 4.0 4.4 3.9  CL 98* 102 102  CO2 28 24 26   GLUCOSE 67 208* 133*  BUN 41* 47* 52*  CREATININE 4.17* 3.92* 3.76*  CALCIUM 9.3 9.1 9.1  PHOS  --  2.2* 2.1*   Liver Function Tests: Recent Labs  Lab 12/14/17 1630 12/15/17 1758 12/16/17 0637  AST 21  --   --   ALT 18  --   --   ALKPHOS 76  --   --  BILITOT 0.4  --   --   PROT 7.1  --   --   ALBUMIN 2.7* 2.3* 2.2*   No results for input(s): LIPASE, AMYLASE in the last 168 hours. CBC: Recent Labs  Lab 12/14/17 1630 12/15/17 0518 12/15/17 1758 12/16/17 0637  WBC 12.7* 8.7 9.5 8.5  NEUTROABS 10.1* 6.4  --  6.4  HGB 9.7* 8.6* 9.0* 8.6*  HCT 32.9* 29.0* 29.8* 28.7*  MCV 96.8 96.0 95.8 94.4  PLT 266 228 219 191   Blood Culture    Component Value Date/Time   SDES BLOOD RIGHT HAND 12/14/2017 1631   SPECREQUEST  12/14/2017 1631    BOTTLES DRAWN AEROBIC AND ANAEROBIC Blood Culture adequate volume   CULT  12/14/2017 1631    NO GROWTH 3 DAYS Performed at [facility], [address], [place], [place] [address]    REPTSTATUS PENDING 12/14/2017 1631    Cardiac Enzymes: No results for input(s): CKTOTAL, CKMB, CKMBINDEX, TROPONINI in the last 168 hours. CBG: Recent Labs  Lab 12/15/17 1640 12/15/17 2011  12/16/17 1336 12/16/17 1629 12/16/17 2146  GLUCAP 174* 177* 101* 162* 138*   Iron Studies: No results for input(s): IRON, TIBC, TRANSFERRIN, FERRITIN in the last 72 hours. Lab Results  Component Value Date   INR 1.83 12/16/2017   INR 1.74 12/15/2017   INR 1.87 12/14/2017   Studies/Results: Dg Abd Portable 1v  Result Date: 12/16/2017 CLINICAL DATA:  Lower abdominal pain for several weeks EXAM: PORTABLE ABDOMEN - 1 VIEW COMPARISON:  11/02/2017 FINDINGS: Scattered large and small bowel gas is noted. Mild retained fecal material is seen. No obstructive changes are noted. No free air is noted. Degenerative changes of the lumbar spine are again seen. IMPRESSION: No acute abnormality noted. Electronically Signed   By: Inez Catalina M.D.   On: 12/16/2017 15:05   Medications: . ferric gluconate (FERRLECIT/NULECIT) IV    . vancomycin     . atorvastatin  20 mg Oral Daily  . timolol  1 drop Left Eye Daily   And  . brimonidine  1 drop Left Eye Daily  . calcitRIOL  0.25 mcg Oral Q T,Th,Sa-HD  . calcium acetate  1,334 mg Oral TID WC  . darbepoetin (ARANESP) injection - DIALYSIS  100 mcg Intravenous Q Wed-HD  . dicyclomine  10 mg Oral TID AC & HS  . febuxostat  40 mg Oral Daily  . feeding supplement (NEPRO CARB STEADY)  237 mL Oral Q24H  . folic acid  1 mg Oral Daily  . hydroxychloroquine  300 mg Oral Daily  . insulin aspart  0-9 Units Subcutaneous TID WC  . ketorolac  1 drop Left Eye QID  . latanoprost  1 drop Left Eye QHS  . metoprolol tartrate  25 mg Oral Q T,Th,Sat-1800  . metoprolol tartrate  50 mg Oral 2 times per day on Sun Mon Wed Fri  . nutrition supplement (JUVEN)  1 packet Oral BID BM  . predniSONE  5 mg Oral Q breakfast  . Vitamin D (Ergocalciferol)  50,000 Units Oral Weekly  . Warfarin - Pharmacist Dosing Inpatient   Does not apply Q24H

## 2017-12-17 NOTE — Progress Notes (Signed)
Called husband and updated him on condition and plan for arteriogram tomorrow  NPO p midnight  Agram with bilat runoff possible intervention tomorrow  [name], MD [facility] [place] Office: [phone] Pager: [phone]

## 2017-12-17 NOTE — Progress Notes (Signed)
PROGRESS NOTE  [name] EBR:[id number] DOB: 03-17-1946 DOA: 12/14/2017 PCP: Sharilyn Sites, MD  Brief History:  72 year old female with a history of ESRD, COPD, atrial fibrillation, lupus, diabetes mellitus type 2, hypertension, DVT right upper extremity, presenting from her PCPs office secondary to worsening of chronic right diabetic foot ulceration with concerns of cellulitis of the right calf.  The patient states that she has had wounds in her bilateral lower extremities for several months.  She denies any recent injury.  The patient had been following up by the wound care center but stopped going in January 2019 due to a change of her health insurance. Since then, she has noticed worsening of the wound and has been having worsening pain for the past few days.    In the emergency department, the patient was noted to have WBC 12.7, but she was afebrile hemodynamically stable.  X-rays of the right foot showed possible tiny erosions at the tops of the second and third distal phalanges.  The patient was started on Vancomycin Zosyn admitted for further evaluation. September 03, 2017, she had a lower extremity arterial duplex evaluation showed a patent right superficial femoral artery with velocities suggestive of a 75-99% stenosis involving the distal segment.  Patent left lower extremity arterial system without evidence of hemodynamically significant stenosis  Assessment/Plan: Cellulitis right lower extremity -MRI right lower extremity negative for osteomyelitis or abscess -failed outpt doxycycline--recently finished 10 days doxy on 12/12/17 -Continue vancomycin -Discontinue Zosyn and ceftriaxone -ESR 80 -CRP 2.1 -concerned vascular insufficiency is more the problem than cellulitis as her wound is worse when compared to picture by Dr. Oneida Alar on 10/15/17 Appreciate vascular surgery assistance.  Diabetic foot ulcer/Peripheral arterial disease -Wound care consult -09/03/17--arterial  duplex--patent right superficial femoral artery with velocities suggestive of a 75-99% stenosis involving the distal segment -follows Dr. Oneida Alar -Transfered to [facility] as discussed above  Atrial fibrillation -Rate controlled -Hold Coumadin for vascular procedure.  Patient actually received vitamin K 5 mg on 12/17/2017. -Continue metoprolol tartrate  ESRD -Consult nephrology for maintenance dialysis -Metabolic bone disease per nephrology  Essential hypertension, uncontrolled -Continue metoprolol tartrate change dosing to twice daily -hold dosing prior to dialysis  Systemic lupus erythematosus -continue Plaquenil and prednisone  Diabetes mellitus type 2, hypoglycemic uncontrolled -hemoglobin A1c 5.8 -Holding Levemir secondary to hypoglycemia, also changing the diet to renal diet only from carb modified. -NovoLog sliding scale  Hyperlipidemia -Continue statin  COPD -stable on RA    Disposition Plan: Follow-up postprocedure course. Family Communication: no Family at bedside  Consultants:  Renal, VVS  Code Status:  FULL   DVT Prophylaxis:  [place] Heparin    Procedures: As Listed in Progress Note Above  Antibiotics: vanco 3/18>>> Zosyn 3/18    Subjective: Does not have any stomach pain right now but mentions that it comes and goes.  No nausea no vomiting.  No fever no chills.  Objective: Vitals:   12/17/17 1000 12/17/17 1030 12/17/17 1052 12/17/17 1127  BP: (!) 124/43 (!) 131/49 (!) 134/97 (!) 132/48  Pulse: 77 79 81 80  Resp:   18 18  Temp:   97.9 F (36.6 C) 98 F (36.7 C)  TempSrc:   Oral Oral  SpO2:   96% 98%  Weight:   73.5 kg (162 lb 0.6 oz)   Height:        Intake/Output Summary (Last 24 hours) at 12/17/2017 1433 Last data filed at 12/17/2017 1052 Gross  per 24 hour  Intake 220 ml  Output 1500 ml  Net -1280 ml   Weight change: -0.9 kg (-1 lb 15.7 oz) Exam:   General:  Pt is alert, follows commands appropriately, not in acute  distress  HEENT: No icterus, No thrush, No neck mass, [place]/AT  Cardiovascular: RRR, S1/S2, no rubs, no gallops  Respiratory: CTA bilaterally, no wheezing, no crackles, no rhonchi  Abdomen: Soft/+BS, non tender, non distended, no guarding  Extremities: Erythema on the posterior calf of the right lower extremity with eschar lesion.  See picture below.  Right posterior calf   Data Reviewed: I have personally reviewed following labs and imaging studies Basic Metabolic Panel: Recent Labs  Lab 12/14/17 1630 12/15/17 1758 12/16/17 0637 12/17/17 0841  NA 138 136 137 134*  K 4.0 4.4 3.9 3.5  CL 98* 102 102 97*  CO2 _0 GLUCOSE 67 208* 133* 114*  BUN 41* 47* 52* 30*  CREATININE 4.17* 3.92* 3.76* 2.75*  CALCIUM 9.3 9.1 9.1 8.8*  PHOS  --  2.2* 2.1* 1.7*   Liver Function Tests: Recent Labs  Lab 12/14/17 1630 12/15/17 1758 12/16/17 0637 12/17/17 0841  AST 21  --   --   --   ALT 18  --   --   --   ALKPHOS 76  --   --   --   BILITOT 0.4  --   --   --   PROT 7.1  --   --   --   ALBUMIN 2.7* 2.3* 2.2* 2.2*   No results for input(s): LIPASE, AMYLASE in the last 168 hours. No results for input(s): AMMONIA in the last 168 hours. Coagulation Profile: Recent Labs  Lab 12/14/17 1630 12/15/17 0518 12/16/17 0637 12/17/17 0500  INR 1.87 1.74 1.83 1.99   CBC: Recent Labs  Lab 12/14/17 1630 12/15/17 0518 12/15/17 1758 12/16/17 0637 12/17/17 0840  WBC 12.7* 8.7 9.5 8.5 8.3  NEUTROABS 10.1* 6.4  --  6.4  --   HGB 9.7* 8.6* 9.0* 8.6* 8.7*  HCT 32.9* 29.0* 29.8* 28.7* 28.1*  MCV 96.8 96.0 95.8 94.4 94.3  PLT 266 228 219 191 220   Cardiac Enzymes: No results for input(s): CKTOTAL, CKMB, CKMBINDEX, TROPONINI in the last 168 hours. BNP: Invalid input(s): POCBNP CBG: Recent Labs  Lab 12/15/17 2011 12/16/17 1336 12/16/17 1629 12/16/17 2146 12/17/17 1116  GLUCAP 177* 101* 162* 138* 78   HbA1C: Recent Labs    12/15/17 0518  HGBA1C 5.8*   Urine analysis:     Component Value Date/Time   COLORURINE AMBER (A) 11/19/2017 0300   APPEARANCEUR HAZY (A) 11/19/2017 0300   LABSPEC 1.021 11/19/2017 0300   PHURINE 5.0 11/19/2017 0300   GLUCOSEU NEGATIVE 11/19/2017 0300   HGBUR NEGATIVE 11/19/2017 0300   BILIRUBINUR NEGATIVE 11/19/2017 0300   KETONESUR NEGATIVE 11/19/2017 0300   PROTEINUR NEGATIVE 11/19/2017 0300   UROBILINOGEN 1.0 03/17/2014 1830   NITRITE NEGATIVE 11/19/2017 0300   LEUKOCYTESUR MODERATE (A) 11/19/2017 0300   Sepsis Labs: _1 (procalcitonin:4,lacticidven:4) ) Recent Results (from the past 240 hour(s))  Culture, blood (Routine x 2)     Status: None (Preliminary result)   Collection Time: 12/14/17  4:30 PM  Result Value Ref Range Status   Specimen Description RIGHT ANTECUBITAL  Final   Special Requests   Final    BOTTLES DRAWN AEROBIC AND ANAEROBIC Blood Culture adequate volume   Culture   Final    NO GROWTH 3 DAYS Performed at [facility]  [facility], [address], [place], [place] [address]    Report Status PENDING  Incomplete  Culture, blood (Routine x 2)     Status: None (Preliminary result)   Collection Time: 12/14/17  4:31 PM  Result Value Ref Range Status   Specimen Description BLOOD RIGHT HAND  Final   Special Requests   Final    BOTTLES DRAWN AEROBIC AND ANAEROBIC Blood Culture adequate volume   Culture   Final    NO GROWTH 3 DAYS Performed at [facility], [address] [place], [place] [address]    Report Status PENDING  Incomplete  MRSA PCR Screening     Status: None   Collection Time: 12/15/17  9:15 AM  Result Value Ref Range Status   MRSA by PCR NEGATIVE NEGATIVE Final    Comment:        The GeneXpert MRSA Assay (FDA approved for NASAL specimens only), is one component of a comprehensive MRSA colonization surveillance program. It is not intended to diagnose MRSA infection nor to guide or monitor treatment for MRSA infections. Performed at [facility], [address], [place], [place] [address]       Scheduled Meds: . atorvastatin  20 mg Oral Daily  . timolol  1 drop Left Eye Daily   And  . brimonidine  1 drop Left Eye Daily  . calcitRIOL  0.25 mcg Oral Q T,Th,Sa-HD  . calcium acetate  667 mg Oral TID WC  . darbepoetin (ARANESP) injection - DIALYSIS  100 mcg Intravenous Q Wed-HD  . dicyclomine  10 mg Oral TID AC & HS  . febuxostat  40 mg Oral Daily  . feeding supplement (NEPRO CARB STEADY)  237 mL Oral Q24H  . feeding supplement (PRO-STAT SUGAR FREE 64)  30 mL Oral BID  . folic acid  1 mg Oral Daily  . hydroxychloroquine  300 mg Oral Daily  . insulin aspart  0-9 Units Subcutaneous TID WC  . ketorolac  1 drop Left Eye QID  . latanoprost  1 drop Left Eye QHS  . metoprolol tartrate  25 mg Oral Q T,Th,Sat-1800  . metoprolol tartrate  50 mg Oral 2 times per day on Sun Mon Wed Fri  . nutrition supplement (JUVEN)  1 packet Oral BID BM  . oxyCODONE      . predniSONE  5 mg Oral Q breakfast  . Vitamin D (Ergocalciferol)  50,000 Units Oral Weekly   Continuous Infusions: . ferric gluconate (FERRLECIT/NULECIT) IV    . vancomycin Stopped (12/17/17 1127)    Procedures/Studies: Dg Chest 2 View  Result Date: 12/14/2017 CLINICAL DATA:  History of leg wound EXAM: CHEST - 2 VIEW COMPARISON:  11/03/2017 FINDINGS: Cardiac shadow is mildly prominent but likely related to the underlying dialysis state. Dialysis catheter is noted on the right and stable. The lungs are well aerated bilaterally without focal infiltrate or sizable effusion. No bony abnormality is seen. IMPRESSION: No active cardiopulmonary disease. Electronically Signed   By: Inez Catalina M.D.   On: 12/14/2017 16:19   Dg Tibia/fibula Right  Result Date: 12/14/2017 CLINICAL DATA:  Right leg ulcer EXAM: RIGHT TIBIA AND FIBULA - 2 VIEW COMPARISON:  11/02/2017 FINDINGS: No acute displaced fracture or malalignment. Fixating screw in the distal fibula with old fibular shaft fracture deformity. Soft tissue ulcer defect posterior mid lower  leg. No soft tissue gas. Vascular calcifications and soft tissue calcifications. IMPRESSION: 1. No acute osseous abnormality 2. Old fracture deformity of the distal fibula 3. Small ulcer  posterior soft tissues of the mid lower leg. Electronically Signed   By: Donavan Foil M.D.   On: 12/14/2017 17:08   Mr Foot Right Wo Contrast  Result Date: 12/15/2017 CLINICAL DATA:  Skin ulcerations since the fall of 2018 on the right second and third toes in a diabetic patient with end-stage renal disease. EXAM: MRI OF THE RIGHT FOREFOOT WITHOUT CONTRAST TECHNIQUE: Multiplanar, multisequence MR imaging of the right forefoot was performed. No intravenous contrast was administered. COMPARISON:  Plain films right foot 12/14/2017. FINDINGS: This exam is degraded by patient motion. Bones/Joint/Cartilage No bone marrow edema to suggest osteomyelitis is identified. No fracture is seen. There is some degenerative change about the first MTP joint. Ligaments Intact. Muscles and Tendons Mild intrasubstance increased T2 signal in intrinsic musculature is most consistent with diabetic myopathy. No intramuscular fluid collection. Soft tissues Negative. The patient's skin ulcerations are not visible likely due to motion. IMPRESSION: Negative for abscess or osteomyelitis. Electronically Signed   By: Inge Rise M.D.   On: 12/15/2017 08:36   Dg Abd Portable 1v  Result Date: 12/16/2017 CLINICAL DATA:  Lower abdominal pain for several weeks EXAM: PORTABLE ABDOMEN - 1 VIEW COMPARISON:  11/02/2017 FINDINGS: Scattered large and small bowel gas is noted. Mild retained fecal material is seen. No obstructive changes are noted. No free air is noted. Degenerative changes of the lumbar spine are again seen. IMPRESSION: No acute abnormality noted. Electronically Signed   By: Inez Catalina M.D.   On: 12/16/2017 15:05   Dg Foot Complete Right  Result Date: 12/14/2017 CLINICAL DATA:  Blister at right middle toe for 1 month EXAM: RIGHT FOOT  COMPLETE - 3+ VIEW COMPARISON:  07/10/2016 FINDINGS: No acute displaced fracture or malalignment. Moderate degenerative changes at the first MTP joint. Vascular calcifications. Possible tiny erosions at the distal phalanges of the second and third digits. No soft tissue gas. No radiopaque foreign body. Moderate plantar calcaneal spur with plantar soft tissue calcifications. IMPRESSION: 1. No acute fracture or malalignment 2. Possible tiny erosions at the tufts of the second and third distal phalanges. If osteomyelitis is suspected, would correlate with MRI. Electronically Signed   By: Donavan Foil M.D.   On: 12/14/2017 17:11    Berle Mull, [facility]  If 7PM-7AM, please contact night-coverage [email or web address] Password [facility] 12/17/2017, 2:33 PM   LOS: 3 days

## 2017-12-17 NOTE — Care Management Note (Addendum)
Case Management Note  Patient Details  Name: [name] MRN: [id number] Date of Birth: 1946/01/15  Subjective/Objective:  History of ESRD on HD, COPD, atrial fibrillation/flutter, DVT of RUE; Admitted with Bilateral LE chronic wounds with worsening right LE calf wound/mod SFA occlusive disease right leg. Had wound for several months s/p fall. She was following up at the wound clinic, but stopped going there in January due to changing health insurance           Action/Plan: Right tibia/fibula x-ray: old fx deformity of the distal fibula/ small ulcer in posterior soft tissues of the mid right calf.  Right foot x-ray: possible tiny erosions of the tuft of the second/third distal phalanges.  MRI suggested if osteomyelitis is suspected. 12/18/17 Arteriogram/possible intervention. Prior to admission patient lived at home with husband.  NCM will continue to monitor for discharge transition needs.  Expected Discharge Date:                  Expected Discharge Plan:  [place]  In-House Referral:  Nutrition  Discharge planning Services  CM Consult  Status of Service:  In process, will continue to follow  Kristen Cardinal, RN  Nurse case [place] 12/17/2017, 11:46 AM

## 2017-12-17 NOTE — Progress Notes (Signed)
[facility]  Patient Status: Active (receiving services up to time of hospitalization)  [facility] is providing the following services: RN, OT and HHA  If patient discharges after hours, please call [phone].   [name] 12/17/2017, 5:35 PM

## 2017-12-18 ENCOUNTER — Encounter (HOSPITAL_COMMUNITY)
Admission: EM | Disposition: A | Discharge status: Skilled Nursing Facility | Payer: Self-pay | Source: Home / Self Care | Attending: Family Medicine

## 2017-12-18 ENCOUNTER — Encounter (HOSPITAL_COMMUNITY): Payer: Self-pay | Admitting: Vascular Surgery

## 2017-12-18 DIAGNOSIS — I70238 Atherosclerosis of native arteries of right leg with ulceration of other part of lower right leg: Secondary | ICD-10-CM

## 2017-12-18 HISTORY — PX: ABDOMINAL AORTOGRAM W/LOWER EXTREMITY: CATH118223

## 2017-12-18 LAB — BASIC METABOLIC PANEL
ANION GAP: 11 (ref 5–15)
BUN: 37 mg/dL — ABNORMAL HIGH (ref 6–20)
CALCIUM: 8.4 mg/dL — AB (ref 8.9–10.3)
CHLORIDE: 97 mmol/L — AB (ref 101–111)
CO2: 26 mmol/L (ref 22–32)
CREATININE: 2.73 mg/dL — AB (ref 0.44–1.00)
GFR calc non Af Amer: 16 mL/min — ABNORMAL LOW (ref >60)
GFR, EST AFRICAN AMERICAN: 19 mL/min — AB (ref >60)
Glucose, Bld: 143 mg/dL — ABNORMAL HIGH (ref 65–99)
Potassium: 3.6 mmol/L (ref 3.5–5.1)
SODIUM: 134 mmol/L — AB (ref 135–145)

## 2017-12-18 LAB — CBC
HCT: 28.6 % — ABNORMAL LOW (ref 36.0–46.0)
Hemoglobin: 8.7 g/dL — ABNORMAL LOW (ref 12.0–15.0)
MCH: 28.6 pg (ref 26.0–34.0)
MCHC: 30.4 g/dL (ref 30.0–36.0)
MCV: 94.1 fL (ref 78.0–100.0)
Platelets: 191 10*3/uL (ref 150–400)
RBC: 3.04 MIL/uL — AB (ref 3.87–5.11)
RDW: 16.9 % — ABNORMAL HIGH (ref 11.5–15.5)
WBC: 6.8 10*3/uL (ref 4.0–10.5)

## 2017-12-18 LAB — GLUCOSE, CAPILLARY
GLUCOSE-CAPILLARY: 120 mg/dL — AB (ref 65–99)
Glucose-Capillary: 120 mg/dL — ABNORMAL HIGH (ref 65–99)
Glucose-Capillary: 115 mg/dL — ABNORMAL HIGH (ref 65–99)

## 2017-12-18 LAB — PROTIME-INR
INR: 1.26
PROTHROMBIN TIME: 15.7 s — AB (ref 11.4–15.2)

## 2017-12-18 LAB — HEPATITIS B SURFACE ANTIGEN: HEP B S AG: NEGATIVE

## 2017-12-18 SURGERY — ABDOMINAL AORTOGRAM W/LOWER EXTREMITY
Anesthesia: LOCAL

## 2017-12-18 MED ORDER — HEPARIN (PORCINE) IN NACL 2-0.9 UNIT/ML-% IJ SOLN
INTRAMUSCULAR | Status: AC | PRN
  Administered 2017-12-18: 500 mL

## 2017-12-18 MED ORDER — MIDAZOLAM HCL 2 MG/2ML IJ SOLN
INTRAMUSCULAR | Status: DC | PRN
  Administered 2017-12-18: 1 mg via INTRAVENOUS

## 2017-12-18 MED ORDER — ACETAMINOPHEN 325 MG PO TABS
650.0000 mg | ORAL_TABLET | ORAL | Status: DC | PRN
  Administered 2017-12-25 – 2017-12-28 (×4): 650 mg via ORAL
  Filled 2017-12-18 (×8): qty 2

## 2017-12-18 MED ORDER — LABETALOL HCL 5 MG/ML IV SOLN
10.0000 mg | INTRAVENOUS | Status: AC | PRN
  Administered 2017-12-18 – 2017-12-29 (×4): 10 mg via INTRAVENOUS
  Filled 2017-12-18 (×5): qty 4

## 2017-12-18 MED ORDER — SODIUM CHLORIDE 0.9 % IV SOLN
INTRAVENOUS | Status: AC | PRN
  Administered 2017-12-18: 10 mL/h via INTRAVENOUS

## 2017-12-18 MED ORDER — MIDAZOLAM HCL 2 MG/2ML IJ SOLN
INTRAMUSCULAR | Status: AC
  Filled 2017-12-18: qty 2

## 2017-12-18 MED ORDER — LIDOCAINE HCL (PF) 1 % IJ SOLN
INTRAMUSCULAR | Status: DC | PRN
  Administered 2017-12-18: 20 mL via INTRADERMAL

## 2017-12-18 MED ORDER — SODIUM CHLORIDE 0.9 % IV SOLN: INTRAVENOUS | Status: AC

## 2017-12-18 MED ORDER — IODIXANOL 320 MG/ML IV SOLN
INTRAVENOUS | Status: DC | PRN
  Administered 2017-12-18: 125 mL via INTRA_ARTERIAL

## 2017-12-18 MED ORDER — LABETALOL HCL 5 MG/ML IV SOLN
INTRAVENOUS | Status: AC
  Filled 2017-12-18: qty 4

## 2017-12-18 MED ORDER — SODIUM CHLORIDE 0.9 % IV SOLN: INTRAVENOUS | Status: DC

## 2017-12-18 MED ORDER — LIDOCAINE HCL 1 % IJ SOLN
INTRAMUSCULAR | Status: AC
  Filled 2017-12-18: qty 20

## 2017-12-18 MED ORDER — FENTANYL CITRATE (PF) 100 MCG/2ML IJ SOLN
INTRAMUSCULAR | Status: AC
  Filled 2017-12-18: qty 2

## 2017-12-18 MED ORDER — MORPHINE SULFATE (PF) 4 MG/ML IV SOLN
INTRAVENOUS | Status: AC
  Filled 2017-12-18: qty 1

## 2017-12-18 MED ORDER — OXYCODONE HCL 5 MG PO TABS
5.0000 mg | ORAL_TABLET | ORAL | Status: DC | PRN
  Administered 2017-12-18 – 2017-12-23 (×12): 10 mg via ORAL
  Filled 2017-12-18 (×10): qty 2

## 2017-12-18 MED ORDER — SODIUM CHLORIDE 0.9% FLUSH
3.0000 mL | Freq: Two times a day (BID) | INTRAVENOUS | Status: DC
  Administered 2017-12-18 – 2017-12-29 (×15): 3 mL via INTRAVENOUS

## 2017-12-18 MED ORDER — SODIUM CHLORIDE 0.9 % IV SOLN: 250.0000 mL | INTRAVENOUS | Status: DC | PRN

## 2017-12-18 MED ORDER — SODIUM CHLORIDE 0.9% FLUSH: 3.0000 mL | INTRAVENOUS | Status: DC | PRN

## 2017-12-18 MED ORDER — ONDANSETRON HCL 4 MG/2ML IJ SOLN: 4.0000 mg | Freq: Four times a day (QID) | INTRAMUSCULAR | Status: DC | PRN

## 2017-12-18 MED ORDER — HYDRALAZINE HCL 20 MG/ML IJ SOLN: 5.0000 mg | INTRAMUSCULAR | Status: DC | PRN

## 2017-12-18 MED ORDER — MORPHINE SULFATE (PF) 2 MG/ML IV SOLN
2.0000 mg | INTRAVENOUS | Status: DC | PRN
  Administered 2017-12-18 – 2017-12-29 (×4): 2 mg via INTRAVENOUS
  Filled 2017-12-18 (×4): qty 1

## 2017-12-18 MED ORDER — MORPHINE SULFATE (PF) 10 MG/ML IV SOLN
2.0000 mg | INTRAVENOUS | Status: DC | PRN
  Administered 2017-12-18 (×2): 2 mg via INTRAVENOUS

## 2017-12-18 MED ORDER — LABETALOL HCL 5 MG/ML IV SOLN
INTRAVENOUS | Status: DC | PRN
  Administered 2017-12-18: 10 mg via INTRAVENOUS

## 2017-12-18 MED ORDER — HYDROMORPHONE HCL 1 MG/ML IJ SOLN
0.5000 mg | INTRAMUSCULAR | Status: DC | PRN
  Administered 2017-12-22: 0.5 mg via INTRAVENOUS

## 2017-12-18 MED ORDER — FENTANYL CITRATE (PF) 100 MCG/2ML IJ SOLN
INTRAMUSCULAR | Status: DC | PRN
  Administered 2017-12-18: 25 ug via INTRAVENOUS

## 2017-12-18 SURGICAL SUPPLY — 14 items
CATH ANGIO 5F PIGTAIL 65CM (CATHETERS) ×1 IMPLANT
CATH CROSS OVER TEMPO 5F (CATHETERS) ×1 IMPLANT
CATH SOFT-VU 4F 65 STRAIGHT (CATHETERS) IMPLANT
CATH SOFT-VU STRAIGHT 4F 65CM (CATHETERS) ×2
CATH STRAIGHT 5FR 65CM (CATHETERS) ×1 IMPLANT
COVER PRB 48X5XTLSCP FOLD TPE (BAG) IMPLANT
COVER PROBE 5X48 (BAG) ×2
KIT PV (KITS) ×2 IMPLANT
SHEATH AVANTI 11CM 5FR (SHEATH) ×2 IMPLANT
SYR MEDRAD MARK V 150ML (SYRINGE) ×2 IMPLANT
TRANSDUCER W/STOPCOCK (MISCELLANEOUS) ×2 IMPLANT
TRAY PV CATH (CUSTOM PROCEDURE TRAY) ×2 IMPLANT
WIRE BENTSON .035X145CM (WIRE) ×1 IMPLANT
WIRE ROSEN-J .035X180CM (WIRE) ×1 IMPLANT

## 2017-12-18 NOTE — Progress Notes (Addendum)
[facility] Progress Note   Subjective:  Seen in room. Says R abdomen is painful, tells me this is a chronic issue which flares up at times. No CP, dyspnea, or leg pain at this time. Plan is for B LE angio w/ runoff +/- plasty today with Dr. Oneida Alar.  Objective Vitals:   12/17/17 1127 12/17/17 1705 12/17/17 2128 12/18/17 0459  BP: (!) 132/48 (!) 150/46 (!) 148/32 (!) 151/51  Pulse: 80 78 72 (!) 103  Resp: 18 18 17 18   Temp: 98 F (36.7 C) 98 F (36.7 C) 98.6 F (37 C) 98.1 F (36.7 C)  TempSrc: Oral Oral Oral Oral  SpO2: 98% 98% 100% 98%  Weight:   76.6 kg (168 lb 14 oz)   Height:       Physical Exam General: Thin female, NAD Heart: RRR; no murmur Lungs: CTAB Abdomen: mild RLQ tenderness, no rebound tenderness or guarding Extremities: No LE edema. Mild R calf tenderness Dialysis Access: R [facility]  Additional Objective Labs: Basic Metabolic Panel: Recent Labs  Lab 12/15/17 1758 12/16/17 0637 12/17/17 0841 12/18/17 0627  NA 136 137 134* 134*  K 4.4 3.9 3.5 3.6  CL 102 102 97* 97*  CO2 24 26 28 26   GLUCOSE 208* 133* 114* 143*  BUN 47* 52* 30* 37*  CREATININE 3.92* 3.76* 2.75* 2.73*  CALCIUM 9.1 9.1 8.8* 8.4*  PHOS 2.2* 2.1* 1.7*  --    Liver Function Tests: Recent Labs  Lab 12/14/17 1630 12/15/17 1758 12/16/17 0637 12/17/17 0841  AST 21  --   --   --   ALT 18  --   --   --   ALKPHOS 76  --   --   --   BILITOT 0.4  --   --   --   PROT 7.1  --   --   --   ALBUMIN 2.7* 2.3* 2.2* 2.2*   CBC: Recent Labs  Lab 12/14/17 1630 12/15/17 0518 12/15/17 1758 12/16/17 0637 12/17/17 0840 12/18/17 0627  WBC 12.7* 8.7 9.5 8.5 8.3 6.8  NEUTROABS 10.1* 6.4  --  6.4  --   --   HGB 9.7* 8.6* 9.0* 8.6* 8.7* 8.7*  HCT 32.9* 29.0* 29.8* 28.7* 28.1* 28.6*  MCV 96.8 96.0 95.8 94.4 94.3 94.1  PLT 266 228 219 191 220 191   Blood Culture    Component Value Date/Time   SDES BLOOD RIGHT HAND 12/14/2017 1631   SPECREQUEST  12/14/2017 1631    BOTTLES DRAWN  AEROBIC AND ANAEROBIC Blood Culture adequate volume   CULT  12/14/2017 1631    NO GROWTH 3 DAYS Performed at [facility], [address], [place], [place] [address]    REPTSTATUS PENDING 12/14/2017 1631    Studies/Results: Dg Abd Portable 1v  Result Date: 12/16/2017 CLINICAL DATA:  Lower abdominal pain for several weeks EXAM: PORTABLE ABDOMEN - 1 VIEW COMPARISON:  11/02/2017 FINDINGS: Scattered large and small bowel gas is noted. Mild retained fecal material is seen. No obstructive changes are noted. No free air is noted. Degenerative changes of the lumbar spine are again seen. IMPRESSION: No acute abnormality noted. Electronically Signed   By: Inez Catalina M.D.   On: 12/16/2017 15:05   Medications: . sodium chloride    . ferric gluconate (FERRLECIT/NULECIT) IV Stopped (12/17/17 1620)  . vancomycin Stopped (12/17/17 1127)   . atorvastatin  20 mg Oral Daily  . timolol  1 drop Left Eye Daily   And  . brimonidine  1 drop Left Eye Daily  . calcitRIOL  0.25 mcg Oral Q T,Th,Sa-HD  . calcium acetate  667 mg Oral TID WC  . darbepoetin (ARANESP) injection - DIALYSIS  100 mcg Intravenous Q Wed-HD  . dicyclomine  10 mg Oral TID AC & HS  . febuxostat  40 mg Oral Daily  . feeding supplement (NEPRO CARB STEADY)  237 mL Oral Q24H  . feeding supplement (PRO-STAT SUGAR FREE 64)  30 mL Oral BID  . folic acid  1 mg Oral Daily  . hydroxychloroquine  300 mg Oral Daily  . insulin aspart  0-9 Units Subcutaneous TID WC  . ketorolac  1 drop Left Eye QID  . latanoprost  1 drop Left Eye QHS  . metoprolol tartrate  25 mg Oral Q T,Th,Sat-1800  . metoprolol tartrate  50 mg Oral 2 times per day on Sun Mon Wed Fri  . nutrition supplement (JUVEN)  1 packet Oral BID BM  . predniSONE  5 mg Oral Q breakfast  . Vitamin D (Ergocalciferol)  50,000 Units Oral Weekly    Dialysis Orders: TTS 4 hr Reids 400/800 3 K 2.5 Ca EDW 78kg right IJ TDC std Heparin, calcitriol 0.25 Mircera 50 last 3/5 --review of records show  she has been pesenting below edw for at least several treatments with post HD wt 75.9 b3/16 and a net UF of 0.7 with her lowest BP being 127/44.  Assessment/Plan: 1. Bilateral LE chronic wounds with worsening right LE calf wound/mod SFA occlusive disease right leg - per primary. Remains on Vancomycin; BCx 3/18 negative. For arteriogram by VVS 3/22. 2. ESRD: Back to TTS schedule for now (bumped earlier this week), next HD 3/23. Apparently has L 1st stage BVT placed in 2015 recent f/u regarding this; [facility]VS asked to eval while here and plan for 2nd stage even if it cannot be done during this admission.  Labs pending 3. Hypertension/volume: BP ok, drops with HD, getting under EDW w/ HD -> likely needs to be adjusted. 4. Anemia: Hgb 8.7, Aranesp 100 given 3/20 off schedule- noted to have heme +stools 2/23 during ED visit - on chronic coumadind/c niferex.Giveweekly Fe - ^ ferritin likely due to chronic wound 5. Metabolic bone disease: Phos low, Phoslo reduced to 1/meals. Continue calcitriol and Vit D. 6. Nutrition: Alb very low (2.2), continue Nepro/Pro-stat/Juven pwdr. 7. Afib- on chronic coumadin/MTP 8. SLE: On Plaquenil. 9. DM: On insulin, per primary  Veneta Penton, PA-C 12/18/2017, 9:16 AM  [facility] Pager: [phone]  Pt seen, examined and agree w A/P as above.  Kelly Splinter MD [employer] pager [phone]   12/18/2017, 2:31 PM

## 2017-12-18 NOTE — Progress Notes (Signed)
Site area: 64fr Lt fem art sheath removed Site Prior to Removal:  Level 0 Pressure Applied For: 20 min Manual:   yes Patient Status During Pull:  A/O Post Pull Site:  Level 0 Post Pull Instructions Given:  Post instructions given and pt understands Post Pull Pulses Present: Doppler Lt Dp/Pt Dressing Applied:  Tegaderm and a 4x4 Bedrest begins @ 11:25AM Comments: Waiting for bed assignment. Pt has a skin tear above puncture site lt groin. 3cm x.5cm present before sheath pull and has remained unchanged.

## 2017-12-18 NOTE — Interval H&P Note (Signed)
History and Physical Interval Note:  12/18/2017 8:59 AM  [name]  has presented today for surgery, with the diagnosis of pvd  The various methods of treatment have been discussed with the patient and family. After consideration of risks, benefits and other options for treatment, the patient has consented to  Procedure(s): ABDOMINAL AORTOGRAM W/LOWER EXTREMITY (N/A) as a surgical intervention .  The patient's history has been reviewed, patient examined, no change in status, stable for surgery.  I have reviewed the patient's chart and labs.  Questions were answered to the patient's satisfaction.     [name]

## 2017-12-18 NOTE — Progress Notes (Signed)
PROGRESS NOTE  [name] FUX:[id number] DOB: 03/23/1946 DOA: 12/14/2017 PCP: Sharilyn Sites, MD  Brief History:  72 year old female with a history of ESRD, COPD, atrial fibrillation, lupus, diabetes mellitus type 2, hypertension, DVT right upper extremity, presenting from her PCPs office secondary to worsening of chronic right diabetic foot ulceration with concerns of cellulitis of the right calf.  The patient states that she has had wounds in her bilateral lower extremities for several months.  She denies any recent injury.  The patient had been following up by the wound care center but stopped going in January 2019 due to a change of her health insurance. Since then, she has noticed worsening of the wound and has been having worsening pain for the past few days.    In the emergency department, the patient was noted to have WBC 12.7, but she was afebrile hemodynamically stable.  X-rays of the right foot showed possible tiny erosions at the tops of the second and third distal phalanges.  The patient was started on Vancomycin Zosyn admitted for further evaluation. September 03, 2017, she had a lower extremity arterial duplex evaluation showed a patent right superficial femoral artery with velocities suggestive of a 75-99% stenosis involving the distal segment.  Patent left lower extremity arterial system without evidence of hemodynamically significant stenosis  Assessment/Plan: Cellulitis right lower extremity -MRI right lower extremity negative for osteomyelitis or abscess -failed outpt doxycycline--recently finished 10 days doxy on 12/12/17 -Continue vancomycin, check ESR and CRP again tomorrow. -Discontinue Zosyn and ceftriaxone -ESR 80 -CRP 2.1 -concerned vascular insufficiency is more the problem than cellulitis as her wound is worse when compared to picture by Dr. Oneida Alar on 10/15/17 Appreciate vascular surgery assistance.  Diabetic foot ulcer/Peripheral arterial disease -Wound  care consult -09/03/17--arterial duplex--patent right superficial femoral artery with velocities suggestive of a 75-99% stenosis involving the distal segment -follows Dr. Oneida Alar Underwent aortogram with runoff.  No significant stenosis identified.  Atrial fibrillation -Rate controlled -Coumadin currently on hold, resume per vascular surgery  ESRD -Consult nephrology for maintenance dialysis -Metabolic bone disease per nephrology  Essential hypertension, uncontrolled -Continue metoprolol tartrate change dosing to twice daily -hold dosing prior to dialysis  Systemic lupus erythematosus -continue Plaquenil and prednisone  Diabetes mellitus type 2, hypoglycemic uncontrolled -hemoglobin A1c 5.8 -Holding Levemir secondary to hypoglycemia, also changing the diet to renal diet only from carb modified. -NovoLog sliding scale  Hyperlipidemia -Continue statin  COPD -stable on RA  Abdominal pain. X-ray unremarkable. Continue PRN pain medications ordered  Disposition Plan: Follow-up postprocedure course. Family Communication: no Family at bedside  Consultants:  Renal, VVS  Code Status:  FULL   DVT Prophylaxis:  [place] Heparin    Procedures: As Listed in Progress Note Above  Antibiotics: vanco 3/18>>> Zosyn 3/18    Subjective: Patient is worried that the man caught her leg.  Denies any other acute complaint.  No nausea no vomiting.  No acute events overnight.  Objective: Vitals:   12/18/17 1600 12/18/17 1605 12/18/17 1610 12/18/17 1615  BP: (!) 168/30 (!) 135/30 (!) 161/39   Pulse: 87 87 91 88  Resp: 19 19 (!) 22 (!) 23  Temp:      TempSrc:      SpO2: 100% 100% 93% 100%  Weight:      Height:        Intake/Output Summary (Last 24 hours) at 12/18/2017 1708 Last data filed at 12/18/2017 0600 Gross per 24 hour  Intake 780 ml  Output 0 ml  Net 780 ml   Weight change: -2 kg (-4 lb 6.6 oz) Exam:   General:  Pt is alert, follows commands appropriately, not in acute  distress  HEENT: No icterus, No thrush, No neck mass, [place]/AT  Cardiovascular: RRR, S1/S2, no rubs, no gallops  Respiratory: CTA bilaterally, no wheezing, no crackles, no rhonchi  Abdomen: Soft/+BS, non tender, non distended, no guarding  Extremities: Erythema on the posterior calf of the right lower extremity with eschar lesion.  See picture below.  Right posterior calf   Data Reviewed: I have personally reviewed following labs and imaging studies Basic Metabolic Panel: Recent Labs  Lab 12/14/17 1630 12/15/17 1758 12/16/17 0637 12/17/17 0841 12/18/17 0627  NA 138 136 137 134* 134*  K 4.0 4.4 3.9 3.5 3.6  CL 98* 102 102 97* 97*  CO2 '28 24 26 28 26  ' GLUCOSE 67 208* 133* 114* 143*  BUN 41* 47* 52* 30* 37*  CREATININE 4.17* 3.92* 3.76* 2.75* 2.73*  CALCIUM 9.3 9.1 9.1 8.8* 8.4*  PHOS  --  2.2* 2.1* 1.7*  --    Liver Function Tests: Recent Labs  Lab 12/14/17 1630 12/15/17 1758 12/16/17 0637 12/17/17 0841  AST 21  --   --   --   ALT 18  --   --   --   ALKPHOS 76  --   --   --   BILITOT 0.4  --   --   --   PROT 7.1  --   --   --   ALBUMIN 2.7* 2.3* 2.2* 2.2*   No results for input(s): LIPASE, AMYLASE in the last 168 hours. No results for input(s): AMMONIA in the last 168 hours. Coagulation Profile: Recent Labs  Lab 12/14/17 1630 12/15/17 0518 12/16/17 0637 12/17/17 0500 12/18/17 0627  INR 1.87 1.74 1.83 1.99 1.26   CBC: Recent Labs  Lab 12/14/17 1630 12/15/17 0518 12/15/17 1758 12/16/17 0637 12/17/17 0840 12/18/17 0627  WBC 12.7* 8.7 9.5 8.5 8.3 6.8  NEUTROABS 10.1* 6.4  --  6.4  --   --   HGB 9.7* 8.6* 9.0* 8.6* 8.7* 8.7*  HCT 32.9* 29.0* 29.8* 28.7* 28.1* 28.6*  MCV 96.8 96.0 95.8 94.4 94.3 94.1  PLT 266 228 219 191 220 191   Cardiac Enzymes: No results for input(s): CKTOTAL, CKMB, CKMBINDEX, TROPONINI in the last 168 hours. BNP: Invalid input(s): POCBNP CBG: Recent Labs  Lab 12/17/17 1116 12/17/17 1701 12/17/17 2122 12/18/17 0742  12/18/17 1105  GLUCAP 78 154* 191* 120* 120*   HbA1C: No results for input(s): HGBA1C in the last 72 hours. Urine analysis:    Component Value Date/Time   COLORURINE AMBER (A) 11/19/2017 0300   APPEARANCEUR HAZY (A) 11/19/2017 0300   LABSPEC 1.021 11/19/2017 0300   PHURINE 5.0 11/19/2017 0300   GLUCOSEU NEGATIVE 11/19/2017 0300   HGBUR NEGATIVE 11/19/2017 0300   BILIRUBINUR NEGATIVE 11/19/2017 0300   KETONESUR NEGATIVE 11/19/2017 0300   PROTEINUR NEGATIVE 11/19/2017 0300   UROBILINOGEN 1.0 03/17/2014 1830   NITRITE NEGATIVE 11/19/2017 0300   LEUKOCYTESUR MODERATE (A) 11/19/2017 0300   Sepsis Labs: '@LABRCNTIP' (procalcitonin:4,lacticidven:4) ) Recent Results (from the past 240 hour(s))  Culture, blood (Routine x 2)     Status: None (Preliminary result)   Collection Time: 12/14/17  4:30 PM  Result Value Ref Range Status   Specimen Description RIGHT ANTECUBITAL  Final   Special Requests   Final    BOTTLES DRAWN AEROBIC AND ANAEROBIC  Blood Culture adequate volume   Culture   Final    NO GROWTH 4 DAYS Performed at [facility], [address], [place], [place] [address]    Report Status PENDING  Incomplete  Culture, blood (Routine x 2)     Status: None (Preliminary result)   Collection Time: 12/14/17  4:31 PM  Result Value Ref Range Status   Specimen Description BLOOD RIGHT HAND  Final   Special Requests   Final    BOTTLES DRAWN AEROBIC AND ANAEROBIC Blood Culture adequate volume   Culture   Final    NO GROWTH 4 DAYS Performed at [facility], [address], [place], [place] [address]    Report Status PENDING  Incomplete  MRSA PCR Screening     Status: None   Collection Time: 12/15/17  9:15 AM  Result Value Ref Range Status   MRSA by PCR NEGATIVE NEGATIVE Final    Comment:        The GeneXpert MRSA Assay (FDA approved for NASAL specimens only), is one component of a comprehensive MRSA colonization surveillance program. It is not intended to diagnose MRSA infection  nor to guide or monitor treatment for MRSA infections. Performed at [facility], [address] [place], [place] [address]   Surgical pcr screen     Status: None   Collection Time: 12/17/17  7:49 PM  Result Value Ref Range Status   MRSA, PCR NEGATIVE NEGATIVE Final   Staphylococcus aureus NEGATIVE NEGATIVE Final    Comment: (NOTE) The Xpert SA Assay (FDA approved for NASAL specimens in patients 73 years of age and older), is one component of a comprehensive surveillance program. It is not intended to diagnose infection nor to guide or monitor treatment. Performed at [facility], [place] [address], [place], [place] [address]      Scheduled Meds: . atorvastatin  20 mg Oral Daily  . timolol  1 drop Left Eye Daily   And  . brimonidine  1 drop Left Eye Daily  . calcitRIOL  0.25 mcg Oral Q T,Th,Sa-HD  . calcium acetate  667 mg Oral TID WC  . darbepoetin (ARANESP) injection - DIALYSIS  100 mcg Intravenous Q Wed-HD  . dicyclomine  10 mg Oral TID AC & HS  . febuxostat  40 mg Oral Daily  . feeding supplement (NEPRO CARB STEADY)  237 mL Oral Q24H  . feeding supplement (PRO-STAT SUGAR FREE 64)  30 mL Oral BID  . folic acid  1 mg Oral Daily  . hydroxychloroquine  300 mg Oral Daily  . insulin aspart  0-9 Units Subcutaneous TID WC  . ketorolac  1 drop Left Eye QID  . latanoprost  1 drop Left Eye QHS  . metoprolol tartrate  25 mg Oral Q T,Th,Sat-1800  . metoprolol tartrate  50 mg Oral 2 times per day on Sun Mon Wed Fri  . nutrition supplement (JUVEN)  1 packet Oral BID BM  . predniSONE  5 mg Oral Q breakfast  . Vitamin D (Ergocalciferol)  50,000 Units Oral Weekly   Continuous Infusions: . sodium chloride    . sodium chloride 100 mL/hr at 12/18/17 1100  . ferric gluconate (FERRLECIT/NULECIT) IV Stopped (12/17/17 1620)  . vancomycin Stopped (12/17/17 1127)    Procedures/Studies: Dg Chest 2 View  Result Date: 12/14/2017 CLINICAL DATA:  History of leg wound EXAM: CHEST - 2 VIEW  COMPARISON:  11/03/2017 FINDINGS: Cardiac shadow is mildly prominent but likely related to the underlying dialysis state. Dialysis  catheter is noted on the right and stable. The lungs are well aerated bilaterally without focal infiltrate or sizable effusion. No bony abnormality is seen. IMPRESSION: No active cardiopulmonary disease. Electronically Signed   By: Inez Catalina M.D.   On: 12/14/2017 16:19   Dg Tibia/fibula Right  Result Date: 12/14/2017 CLINICAL DATA:  Right leg ulcer EXAM: RIGHT TIBIA AND FIBULA - 2 VIEW COMPARISON:  11/02/2017 FINDINGS: No acute displaced fracture or malalignment. Fixating screw in the distal fibula with old fibular shaft fracture deformity. Soft tissue ulcer defect posterior mid lower leg. No soft tissue gas. Vascular calcifications and soft tissue calcifications. IMPRESSION: 1. No acute osseous abnormality 2. Old fracture deformity of the distal fibula 3. Small ulcer posterior soft tissues of the mid lower leg. Electronically Signed   By: Donavan Foil M.D.   On: 12/14/2017 17:08   Mr Foot Right Wo Contrast  Result Date: 12/15/2017 CLINICAL DATA:  Skin ulcerations since the fall of 2018 on the right second and third toes in a diabetic patient with end-stage renal disease. EXAM: MRI OF THE RIGHT FOREFOOT WITHOUT CONTRAST TECHNIQUE: Multiplanar, multisequence MR imaging of the right forefoot was performed. No intravenous contrast was administered. COMPARISON:  Plain films right foot 12/14/2017. FINDINGS: This exam is degraded by patient motion. Bones/Joint/Cartilage No bone marrow edema to suggest osteomyelitis is identified. No fracture is seen. There is some degenerative change about the first MTP joint. Ligaments Intact. Muscles and Tendons Mild intrasubstance increased T2 signal in intrinsic musculature is most consistent with diabetic myopathy. No intramuscular fluid collection. Soft tissues Negative. The patient's skin ulcerations are not visible likely due to motion.  IMPRESSION: Negative for abscess or osteomyelitis. Electronically Signed   By: Inge Rise M.D.   On: 12/15/2017 08:36   Dg Abd Portable 1v  Result Date: 12/16/2017 CLINICAL DATA:  Lower abdominal pain for several weeks EXAM: PORTABLE ABDOMEN - 1 VIEW COMPARISON:  11/02/2017 FINDINGS: Scattered large and small bowel gas is noted. Mild retained fecal material is seen. No obstructive changes are noted. No free air is noted. Degenerative changes of the lumbar spine are again seen. IMPRESSION: No acute abnormality noted. Electronically Signed   By: Inez Catalina M.D.   On: 12/16/2017 15:05   Dg Foot Complete Right  Result Date: 12/14/2017 CLINICAL DATA:  Blister at right middle toe for 1 month EXAM: RIGHT FOOT COMPLETE - 3+ VIEW COMPARISON:  07/10/2016 FINDINGS: No acute displaced fracture or malalignment. Moderate degenerative changes at the first MTP joint. Vascular calcifications. Possible tiny erosions at the distal phalanges of the second and third digits. No soft tissue gas. No radiopaque foreign body. Moderate plantar calcaneal spur with plantar soft tissue calcifications. IMPRESSION: 1. No acute fracture or malalignment 2. Possible tiny erosions at the tufts of the second and third distal phalanges. If osteomyelitis is suspected, would correlate with MRI. Electronically Signed   By: Donavan Foil M.D.   On: 12/14/2017 17:11    Berle Mull, [facility]  If 7PM-7AM, please contact night-coverage [email or web address] Password [email or web address] 12/18/2017, 5:08 PM   LOS: 4 days

## 2017-12-18 NOTE — Progress Notes (Signed)
Patient was taken for her aortagram. Notified that patient will not return to this unit post procedure.

## 2017-12-18 NOTE — Op Note (Signed)
Procedure: Abdominal aortogram with bilateral lower extremity runoff  Preoperative diagnosis: Nonhealing wound right leg  Postoperative diagnosis: Same  Anesthesia: Local with IV sedation  Operative findings: #1 essentially patent right lower extremity arterial tree with focal less than 1 mm length stenosis of right superficial femoral artery three-vessel runoff right foot #2 similar findings left leg with two-vessel runoff  Operative details: After obtaining informed consent, the patient was taken the PV lab.  The patient was placed in supine position Angio table.  Both groins were prepped and draped in usual sterile fashion.  Local anesthesia was infiltrated over the left common femoral artery.  Ultrasound was used to identify the left common femoral artery and femoral bifurcation.  An introducer needle was then used to cannulate the left common femoral artery under ultrasound guidance.  An 19 Bentson wire was advanced up into the left iliac system under fluoroscopic guidance.  A 5 French sheath was placed over the guidewire and left common femoral artery.  Next an [place] wire was advanced up to the abdominal aorta and a 5 French sheath placed over the guidewire into the left common femoral artery.  This was thoroughly flushed with heparinized saline.  A 5 French pigtail catheter was advanced over the guidewire in the abdominal aorta and an abdominal aortogram was obtained in AP projection.  This shows a left and right renal arteries are patent.  The infrarenal abdominal aorta is patent with no stenosis.  The left and right common internal and external iliac arteries are all widely patent.  Next the pigtail catheter was pulled down just above the aortic bifurcation and bilateral lower extremity runoff views were obtained through the pigtail catheter.  In the left lower extremity, the left common femoral profunda femoris and superficial femoral arteries are all patent.  There is some mild stenosis  of the left superficial femoral artery but no flow-limiting lesion.  The popliteal artery is patent.  There is two-vessel runoff to the left foot via the posterior tibial and peroneal arteries.  In the right lower extremity, the right common femoral profunda femoris and superficial femoral arteries are all patent.  There is a focal less than 1 mm length stenosis of the right superficial femoral artery otherwise the artery is widely patent.  The right popliteal artery is patent.  There is three-vessel runoff via the anterior tibial posterior tibial and peroneal arteries to the right foot.  At this point the pigtail catheter was removed over a guidewire and exchanged for a 5 [place] crossover catheter.  This was used to selectively catheterize the right common iliac artery.  I then advanced the [facility] wire all the way down into the right superficial femoral artery.  A 5 French straight catheter was then exchanged over the wire for the crossover catheter.  Right lower extremity angiogram was then performed from the mid SFA to further clarify the tibial vessels.  This again confirmed the above findings.  At this point the 5 French straight catheter was removed and the 5 French sheath left in place to be pulled in the holding area.  The patient tolerated the procedure well and there were no complications.  The patient was taken to the holding area in stable condition.  Operative management: No vascular etiology found for her right calf wound.  This is not a venous stasis ulcer or an ulcer that is arterial in nature.  I am highly suspicious that this ulcer more likely represents calciphylaxis or some other unknown etiology.  Would recommend biopsy of this lesion to rule out calciphylaxis.  We have also been requested from the renal service to address permanent hemodialysis access in this patient.  However, I would defer this for now until we have further clarified exactly what her wound issues are in the etiology of  these.  If this turns out to be calciphylaxis it may change our algorithm on what type of access we would provide for her.  We will continue to follow along peripherally for her access planning at some point in the future.  However, this may not necessarily be placed while she is an inpatient with her current issues related to her wound.  [name], MD [facility] Office: [phone] Pager: [phone]

## 2017-12-18 NOTE — Progress Notes (Signed)
Pharmacy Antibiotic Note  [name] is Dana 72 y.o. female admitted on 12/14/2017 with cellulitis.  Pharmacy has been consulted for Vancomycin dosing. Patient has been tolerating their HD sessions.   Plan: Will continue vancomycin after dialysis at current dosing.   Height: 5\' 5"  (165.1 cm) Weight: 168 lb 14 oz (76.6 kg) IBW/kg (Calculated) : 57  Temp (24hrs), Avg:98.1 F (36.7 C), Min:97.9 F (36.6 C), Max:98.6 F (37 C)  Recent Labs  Lab 12/14/17 1630 12/14/17 1631 12/15/17 0518 12/15/17 1758 12/16/17 0637 12/17/17 0840 12/17/17 0841 12/18/17 0627  WBC 12.7*  --  8.7 9.5 8.5 8.3  --  6.8  CREATININE 4.17*  --   --  3.92* 3.76*  --  2.75* 2.73*  LATICACIDVEN  --  1.2  --   --   --   --   --   --     Estimated Creatinine Clearance: 19.1 mL/min (Dana) (by C-G formula based on SCr of 2.73 mg/dL (H)).    Allergies  Allergen Reactions  . Ace Inhibitors Cough    Antimicrobials this admission: Vanc 3/18 >>  Zosyn 3/18  >> 3/19  Microbiology results: 3/18 BCx: ngtd   Thank you for allowing pharmacy to be Dana part of this patient's care.  [name] [name] 12/18/2017 9:06 AM

## 2017-12-19 LAB — CULTURE, BLOOD (ROUTINE X 2)
CULTURE: NO GROWTH
Culture: NO GROWTH
SPECIAL REQUESTS: ADEQUATE
SPECIAL REQUESTS: ADEQUATE

## 2017-12-19 LAB — RENAL FUNCTION PANEL
Albumin: 2.1 g/dL — ABNORMAL LOW (ref 3.5–5.0)
Anion gap: 12 (ref 5–15)
BUN: 46 mg/dL — ABNORMAL HIGH (ref 6–20)
CO2: 26 mmol/L (ref 22–32)
Calcium: 8.4 mg/dL — ABNORMAL LOW (ref 8.9–10.3)
Chloride: 95 mmol/L — ABNORMAL LOW (ref 101–111)
Creatinine, Ser: 4.4 mg/dL — ABNORMAL HIGH (ref 0.44–1.00)
GFR calc Af Amer: 11 mL/min — ABNORMAL LOW (ref >60)
GFR calc non Af Amer: 9 mL/min — ABNORMAL LOW (ref >60)
Glucose, Bld: 108 mg/dL — ABNORMAL HIGH (ref 65–99)
Phosphorus: 2.5 mg/dL (ref 2.5–4.6)
Potassium: 3.7 mmol/L (ref 3.5–5.1)
Sodium: 133 mmol/L — ABNORMAL LOW (ref 135–145)

## 2017-12-19 LAB — PROTIME-INR
INR: 1.11
Prothrombin Time: 14.2 seconds (ref 11.4–15.2)

## 2017-12-19 LAB — CBC
HEMATOCRIT: 25.9 % — AB (ref 36.0–46.0)
HEMOGLOBIN: 7.9 g/dL — AB (ref 12.0–15.0)
MCH: 28.3 pg (ref 26.0–34.0)
MCHC: 30.5 g/dL (ref 30.0–36.0)
MCV: 92.8 fL (ref 78.0–100.0)
PLATELETS: 201 10*3/uL (ref 150–400)
RBC: 2.79 MIL/uL — AB (ref 3.87–5.11)
RDW: 16.7 % — ABNORMAL HIGH (ref 11.5–15.5)
WBC: 9.4 10*3/uL (ref 4.0–10.5)

## 2017-12-19 LAB — GLUCOSE, CAPILLARY
GLUCOSE-CAPILLARY: 96 mg/dL (ref 65–99)
Glucose-Capillary: 135 mg/dL — ABNORMAL HIGH (ref 65–99)
Glucose-Capillary: 160 mg/dL — ABNORMAL HIGH (ref 65–99)

## 2017-12-19 MED ORDER — HEPARIN (PORCINE) IN NACL 100-0.45 UNIT/ML-% IJ SOLN
1100.0000 [IU]/h | INTRAMUSCULAR | Status: DC
  Administered 2017-12-19 – 2017-12-20 (×2): 1100 [IU]/h via INTRAVENOUS
  Filled 2017-12-19 (×2): qty 250

## 2017-12-19 MED ORDER — OXYCODONE HCL 5 MG PO TABS
ORAL_TABLET | ORAL | Status: AC
  Administered 2017-12-19: 10 mg via ORAL
  Filled 2017-12-19: qty 2

## 2017-12-19 MED ORDER — CALCITRIOL 0.25 MCG PO CAPS
ORAL_CAPSULE | ORAL | Status: AC
  Administered 2017-12-19: 0.25 ug via ORAL
  Filled 2017-12-19: qty 1

## 2017-12-19 MED ORDER — VANCOMYCIN HCL IN DEXTROSE 1-5 GM/200ML-% IV SOLN
INTRAVENOUS | Status: AC
  Filled 2017-12-19: qty 200

## 2017-12-19 NOTE — Progress Notes (Signed)
Patient arrived to unit by bed.  Reviewed treatment plan and this RN agrees with plan.  Report received from bedside RN, [place].  Consent verified.  Patient A & O X 4.   Lung sounds diminished and clear to ausculation in all fields. No edema. Cardiac:  NSR.  Removed caps and cleansed RIJ catheter with chlorhedxidine.  Aspirated ports of heparin and flushed them with saline per protocol.  Connected and secured lines, initiated treatment at [phone].  UF Goal of 1000 mL and net fluid removal 0.5 L.  Will continue to monitor.

## 2017-12-19 NOTE — Progress Notes (Addendum)
  Progress Note    12/19/2017 9:50 AM 1 Day Post-Op  Subjective:  No complaints  afebrile  Vitals:   12/19/17 0816 12/19/17 0833  BP: (!) 127/47 (!) 134/47  Pulse: 75 72  Resp: 16   Temp: 97.9 F (36.6 C)   SpO2:      Physical Exam: General:  No distress on HD Lungs:  Non labored Incisions:  Left groin is soft without hematoma   CBC    Component Value Date/Time   WBC 9.4 12/19/2017 0905   RBC 2.79 (L) 12/19/2017 0905   HGB 7.9 (L) 12/19/2017 0905   HGB 8.4 (L) 07/17/2017 1025   HCT 25.9 (L) 12/19/2017 0905   PLT 201 12/19/2017 0905   MCV 92.8 12/19/2017 0905   MCH 28.3 12/19/2017 0905   MCHC 30.5 12/19/2017 0905   RDW 16.7 (H) 12/19/2017 0905   LYMPHSABS 1.2 12/16/2017 0637   MONOABS 0.8 12/16/2017 0637   EOSABS 0.1 12/16/2017 0637   BASOSABS 0.0 12/16/2017 0637    BMET    Component Value Date/Time   NA 133 (L) 12/19/2017 0905   NA 146 (H) 10/20/2017 1029   K 3.7 12/19/2017 0905   CL 95 (L) 12/19/2017 0905   CO2 26 12/19/2017 0905   GLUCOSE 108 (H) 12/19/2017 0905   BUN 46 (H) 12/19/2017 0905   BUN 68 (H) 10/20/2017 1029   CREATININE 4.40 (H) 12/19/2017 0905   CALCIUM 8.4 (L) 12/19/2017 0905   CALCIUM 7.0 (L) 03/16/2014 0835   GFRNONAA 9 (L) 12/19/2017 0905   GFRAA 11 (L) 12/19/2017 0905    INR    Component Value Date/Time   INR 1.26 12/18/2017 0627     Intake/Output Summary (Last 24 hours) at 12/19/2017 0950 Last data filed at 12/19/2017 0700 Gross per 24 hour  Intake 100 ml  Output -  Net 100 ml     Assessment:  72 y.o. female is s/p:  Abdominal aortogram with bilateral lower extremity runoff  1 Day Post-Op   Plan: -pt doing well this morning on HD -left groin is without hematoma -no vascular etiology for right calf wound.  This is not a venous stasis ulcer or an ulcer that is arterial in nature.  I am highly suspicious that this ulcer more likely represents calciphylaxis or some other unknown etiology.  Would recommend biopsy of  this lesion to rule out calciphylaxis.  We have also been requested from the renal service to address permanent hemodialysis access in this patient.  However, I would defer this for now until we have further clarified exactly what her wound issues are in the etiology of these.  If this turns out to be calciphylaxis it may change our algorithm on what type of access we would provide for her.  We will continue to follow along peripherally for her access planning at some point in the future.  However, this may not necessarily be placed while she is an inpatient with her current issues related to her wound.     Leontine Locket, PA-C [facility] [phone] 12/19/2017 9:50 AM  Agree with above  [name], MD [facility] [place] Office: [phone] Pager: [phone]

## 2017-12-19 NOTE — Progress Notes (Signed)
Patient transferred to hemodialysis this morning for treatment. Called report to Jeneen Rinks RN on [place] where patient will be transferred after dialysis. Text-paged Dr. Cordelia Poche regarding closing of our unit and patient being transferred to 4east 25 after dialysis. Patient belongings brought to 4east room 25 and medications in pyxis tubed to 4east.

## 2017-12-19 NOTE — Progress Notes (Signed)
PROGRESS NOTE    [name]  DDU:[id number] DOB: 03/21/1946 DOA: 12/14/2017 PCP: Sharilyn Sites, MD   Brief Narrative: [name] is a 72 y.o. female with a history of ESRD, COPD, atrial fibrillation, lupus, diabetes mellitus type 2, hypertension, DVT right upper extremity, presenting from her PCPs office secondary to worsening of chronic right diabetic foot ulceration with concerns of cellulitis of the right calf.  The patient states that she has had wounds in her bilateral lower extremities for several months.  She denies any recent injury.  The patient had been following up by the wound care center but stopped going in January 2019 due to a change of her health insurance. Since then, she has noticed worsening of the wound and has been having worsening pain for the past few days.   In the emergency department, the patient was noted to have WBC 12.7, but she was afebrile hemodynamically stable.  X-rays of the right foot showed possible tiny erosions at the tops of the second and third distal phalanges.  The patient was started on Vancomycin Zosyn admitted for further evaluation. September 03, 2017, she had a lower extremity arterial duplex evaluation showed a patent right superficial femoral artery with velocities suggestive of a 75-99% stenosis involving the distal segment. Patent left lower extremity arterial system without evidence of hemodynamically significant stenosis. 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 Differential now includes calciphylaxis vs warfarin skin necrosis. Coumadin held. Calcium/vitamin D containing products discontinued per nephrology -Nephrology recs: starting sodium thio -General surgery consulted for skin biopsy on 3/25  Atrial fibrillation CHA2DS2-VASc Score is 4. Rate controlled. Coumadin held secondary to vascular procedure. Now on hold secondary to possible warfarin skin necrosis -Heparin gtt per pharmacy -Continue metoprolol  ESRD -Nephrology for HD  Essential hypertension -Continue metoprolol  SLE -Continue Plaquenil and prednisone  Diabetes mellitus, type 2 Last hemoglobin A1C of 5.8%. Levemir held. -Continue SSI  COPD Stable.  Abdominal pain Appears improved. X-ray unremarkable.   DVT prophylaxis: Heparin gtt Code Status:   Code Status: Full Code Family Communication: Husband Disposition Plan: Pending workup/biopsy of wound   Consultants:   Nephrology  Vascular surgery  General surgery  Procedures:   Arteriogram  Antimicrobials:  Vancomycin (3/18>>3/23)  Zosyn (3/18>>3/19)   Subjective: Patient reports right leg pain that is severe.  Objective: Vitals:   12/19/17 1200 12/19/17 1230 12/19/17 1233 12/19/17 1507  BP: (!) 143/46 (!) 133/56 (!) 147/47 (!) 142/89  Pulse: 78 79 81 81  Resp:   18 18  Temp:   98.4 F (36.9 C) 99.1 F (37.3 C)  TempSrc:   Oral Oral  SpO2:   98% 100%  Weight:   76.8 kg (169 lb 5 oz) 78.6 kg (173 lb 4.8 oz)  Height:    5\' 5"  (1.651 m)    Intake/Output Summary (Last 24 hours) at 12/19/2017 1700  Last data filed at 12/19/2017 1233 Gross per 24 hour  Intake 100 ml  Output 500 ml  Net -400 ml   Filed Weights   12/19/17 0816 12/19/17 1233 12/19/17 1507  Weight: 77.2 kg (170 lb 3.1 oz) 76.8 kg (169 lb 5 oz) 78.6 kg (173 lb 4.8 oz)    Examination:  General exam: Appears  calm and slightly uncomfortable Respiratory system: Clear to auscultation. Respiratory effort normal. Cardiovascular system: S1 & S2 heard, regular rate, 2/6 murmur. Gastrointestinal system: Abdomen is nondistended, soft and nontender. Normal bowel sounds heard. Central nervous system: Alert and oriented. No focal neurological deficits. Extremities: No edema. No calf tenderness Skin: No cyanosis. Ulcerative lesion on back of right calf Psychiatry: Judgement and insight appear normal. Mood & affect appropriate.    Data Reviewed: I have personally reviewed following labs and imaging studies  CBC: Recent Labs  Lab 12/14/17 1630 12/15/17 0518 12/15/17 1758 12/16/17 0637 12/17/17 0840 12/18/17 0627 12/19/17 0905  WBC 12.7* 8.7 9.5 8.5 8.3 6.8 9.4  NEUTROABS 10.1* 6.4  --  6.4  --   --   --   HGB 9.7* 8.6* 9.0* 8.6* 8.7* 8.7* 7.9*  HCT 32.9* 29.0* 29.8* 28.7* 28.1* 28.6* 25.9*  MCV 96.8 96.0 95.8 94.4 94.3 94.1 92.8  PLT 266 228 219 191 220 191 700   Basic Metabolic Panel: Recent Labs  Lab 12/15/17 1758 12/16/17 0637 12/17/17 0841 12/18/17 0627 12/19/17 0905  NA 136 137 134* 134* 133*  K 4.4 3.9 3.5 3.6 3.7  CL 102 102 97* 97* 95*  CO2 24 26 28 26 26   GLUCOSE 208* 133* 114* 143* 108*  BUN 47* 52* 30* 37* 46*  CREATININE 3.92* 3.76* 2.75* 2.73* 4.40*  CALCIUM 9.1 9.1 8.8* 8.4* 8.4*  PHOS 2.2* 2.1* 1.7*  --  2.5   GFR: Estimated Creatinine Clearance: 12 mL/min (A) (by C-G formula based on SCr of 4.4 mg/dL (H)). Liver Function Tests: Recent Labs  Lab 12/14/17 1630 12/15/17 1758 12/16/17 0637 12/17/17 0841 12/19/17 0905  AST 21  --   --   --   --   ALT 18  --   --   --   --   ALKPHOS 76  --   --   --   --   BILITOT 0.4  --   --   --   --   PROT 7.1  --   --   --   --   ALBUMIN 2.7* 2.3* 2.2* 2.2* 2.1*   No results for input(s): LIPASE, AMYLASE in the last 168 hours. No results for input(s): AMMONIA in the last 168 hours. Coagulation Profile: Recent Labs  Lab  12/14/17 1630 12/15/17 0518 12/16/17 0637 12/17/17 0500 12/18/17 0627  INR 1.87 1.74 1.83 1.99 1.26   Cardiac Enzymes: No results for input(s): CKTOTAL, CKMB, CKMBINDEX, TROPONINI in the last 168 hours. BNP (last 3 results) No results for input(s): PROBNP in the last 8760 hours. HbA1C: No results for input(s): HGBA1C in the last 72 hours. CBG: Recent Labs  Lab 12/17/17 2122 12/18/17 0742 12/18/17 1105 12/18/17 2049 12/19/17 0619  GLUCAP 191* 120* 120* 115* 135*   Lipid Profile: No results for input(s): CHOL, HDL, LDLCALC, TRIG, CHOLHDL, LDLDIRECT in the last 72 hours. Thyroid Function Tests: No results for input(s): TSH, T4TOTAL, FREET4, T3FREE, THYROIDAB in the last 72 hours. Anemia Panel: No results for input(s): VITAMINB12, FOLATE, FERRITIN, TIBC, IRON, RETICCTPCT in the last 72 hours. Sepsis Labs: Recent Labs  Lab 12/14/17 1631  LATICACIDVEN 1.2    Recent Results (from the past 240 hour(s))  Culture, blood (Routine x 2)     Status: None   Collection Time: 12/14/17  4:30 PM  Result Value Ref Range Status   Specimen Description RIGHT ANTECUBITAL  Final   Special Requests   Final    BOTTLES DRAWN AEROBIC AND ANAEROBIC Blood Culture adequate volume   Culture   Final    NO GROWTH 5 DAYS Performed at [facility], [address], [place], [place] [address]    Report Status 12/19/2017 FINAL  Final  Culture, blood (Routine x 2)     Status: None   Collection Time: 12/14/17  4:31 PM  Result Value Ref Range Status   Specimen Description BLOOD RIGHT HAND  Final   Special Requests   Final    BOTTLES DRAWN AEROBIC AND ANAEROBIC Blood Culture adequate volume   Culture   Final    NO GROWTH 5 DAYS Performed at [facility], [address] [place], [place] [address]    Report Status 12/19/2017 FINAL  Final  MRSA PCR Screening     Status: None   Collection Time: 12/15/17  9:15 AM  Result Value Ref Range Status   MRSA by PCR NEGATIVE NEGATIVE Final    Comment:          The GeneXpert MRSA Assay (FDA approved for NASAL specimens only), is one component of a comprehensive MRSA colonization surveillance program. It is not intended to diagnose MRSA infection nor to guide or monitor treatment for MRSA infections. Performed at [facility], [address], [place],  [address]   Surgical pcr screen     Status: None   Collection Time: 12/17/17  7:49 PM  Result Value Ref Range Status   MRSA, PCR NEGATIVE NEGATIVE Final   Staphylococcus aureus NEGATIVE NEGATIVE Final    Comment: (NOTE) The Xpert SA Assay (FDA approved for NASAL specimens in patients 50 years of age and older), is one component of a comprehensive surveillance program. It is not intended to diagnose infection nor to guide or monitor treatment. Performed at [facility], [place] [address], [place],  [address]          Radiology Studies: No results found.      Scheduled Meds: . atorvastatin  20 mg Oral Daily  . timolol  1 drop Left Eye Daily   And  . brimonidine  1 drop Left Eye Daily  . darbepoetin (ARANESP) injection - DIALYSIS  100 mcg Intravenous Q Wed-HD  . dicyclomine  10 mg Oral TID AC & HS  . febuxostat  40 mg Oral Daily  . feeding supplement (NEPRO CARB STEADY)  237 mL Oral Q24H  . feeding supplement (PRO-STAT SUGAR FREE 64)  30 mL Oral BID  . folic acid  1 mg Oral Daily  . hydroxychloroquine  300 mg Oral Daily  . insulin aspart  0-9 Units Subcutaneous TID WC  . ketorolac  1 drop Left Eye QID  . latanoprost  1 drop Left Eye QHS  . metoprolol tartrate  25 mg Oral Q T,Th,Sat-1800  . metoprolol tartrate  50 mg Oral 2 times per day on Sun Mon Wed Fri  . nutrition supplement (JUVEN)  1 packet Oral BID BM  . predniSONE  5 mg Oral Q breakfast  . sodium chloride flush  3 mL Intravenous Q12H   Continuous Infusions: . sodium chloride    . ferric gluconate (FERRLECIT/NULECIT) IV  Stopped (12/17/17 1620)  . heparin 1,100 Units/hr (12/19/17 1635)  .  vancomycin       LOS: 5 days     Cordelia Poche, MD [facility] 12/19/2017, 5:00 PM Pager: [phone]  If 7PM-7AM, please contact night-coverage [email or web address] Password [facility] 12/19/2017, 5:00 PM

## 2017-12-19 NOTE — Progress Notes (Signed)
ANTICOAGULATION CONSULT NOTE - Initial Consult  Pharmacy Consult for heparin Indication: atrial fibrillation and history of DVT  Allergies  Allergen Reactions  . Ace Inhibitors Cough    Patient Measurements: Height: 5\' 5"  (165.1 cm) Weight: 173 lb 4.8 oz (78.6 kg) IBW/kg (Calculated) : 57 Heparin Dosing Weight: 73.5kg  Vital Signs: Temp: 99.1 F (37.3 C) (03/23 1507) Temp Source: Oral (03/23 1507) BP: 142/89 (03/23 1507) Pulse Rate: 81 (03/23 1507)  Labs: Recent Labs    12/17/17 0500  12/17/17 0840 12/17/17 0841 12/18/17 0627 12/19/17 0905  HGB  --    < > 8.7*  --  8.7* 7.9*  HCT  --   --  28.1*  --  28.6* 25.9*  PLT  --   --  220  --  191 201  LABPROT 22.5*  --   --   --  15.7*  --   INR 1.99  --   --   --  1.26  --   CREATININE  --   --   --  2.75* 2.73* 4.40*   < > = values in this interval not displayed.    Estimated Creatinine Clearance: 12 mL/min (A) (by C-G formula based on SCr of 4.4 mg/dL (H)).  Assessment: 42 YOF with history of AFib and DVT on warfarin PTA. Warfarin was originally continued while inpatient (last dose given 3/20), but was then held due to need for vascular procedure.  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 To bridge with heparin.  hgb 7.9, plts 201- no bleeding noted.  Home dose of warfarin 4mg  daily. Patient did receive vitamin K 5mg  PO x1 on 3/21 in preparation for vascular procedure.  Goal of Therapy:  Heparin level 0.3-0.7 units/ml Monitor platelets by anticoagulation protocol: Yes   Plan:  Start heparin with no bolus at 1100 units/hr Heparin level in 8h Daily heparin level and CBC Follow for s/s bleeding and ability to restart warfarin  Rehana Uncapher D. Lacretia Tindall, PharmD, BCPS Clinical Pharmacist Clinical Phone for 12/19/2017 until 3:30pm: [phone] If after 3:30pm, please call main pharmacy at [phone] 12/19/2017 3:52 PM

## 2017-12-19 NOTE — Progress Notes (Signed)
[facility] Progress Note   Subjective:  Arteriogram didn't show any vasc cause for the leg ulcers.    Objective Vitals:   12/19/17 0816 12/19/17 0833 12/19/17 0900 12/19/17 0930  BP: (!) 127/47 (!) 134/47 (!) 126/52 (!) 132/56  Pulse: 75 72 71 73  Resp: 16   19  Temp: 97.9 F (36.6 C)     TempSrc:      SpO2:      Weight: 77.2 kg (170 lb 3.1 oz)     Height:       Physical Exam General: Thin female, NAD Heart: RRR; no murmur Lungs: CTAB Abdomen: mild RLQ tenderness, no rebound tenderness or guarding Extremities: No LE edema. Mild R calf tenderness Dialysis Access: R [facility]  Additional Objective Labs: Basic Metabolic Panel: Recent Labs  Lab 12/16/17 0637 12/17/17 0841 12/18/17 0627 12/19/17 0905  NA 137 134* 134* 133*  K 3.9 3.5 3.6 3.7  CL 102 97* 97* 95*  CO2 26 28 26 26   GLUCOSE 133* 114* 143* 108*  BUN 52* 30* 37* 46*  CREATININE 3.76* 2.75* 2.73* 4.40*  CALCIUM 9.1 8.8* 8.4* 8.4*  PHOS 2.1* 1.7*  --  2.5   Liver Function Tests: Recent Labs  Lab 12/14/17 1630  12/16/17 0637 12/17/17 0841 12/19/17 0905  AST 21  --   --   --   --   ALT 18  --   --   --   --   ALKPHOS 76  --   --   --   --   BILITOT 0.4  --   --   --   --   PROT 7.1  --   --   --   --   ALBUMIN 2.7*   < > 2.2* 2.2* 2.1*   < > = values in this interval not displayed.   CBC: Recent Labs  Lab 12/14/17 1630 12/15/17 0518 12/15/17 1758 12/16/17 0637 12/17/17 0840 12/18/17 0627 12/19/17 0905  WBC 12.7* 8.7 9.5 8.5 8.3 6.8 9.4  NEUTROABS 10.1* 6.4  --  6.4  --   --   --   HGB 9.7* 8.6* 9.0* 8.6* 8.7* 8.7* 7.9*  HCT 32.9* 29.0* 29.8* 28.7* 28.1* 28.6* 25.9*  MCV 96.8 96.0 95.8 94.4 94.3 94.1 92.8  PLT 266 228 219 191 220 191 201   Blood Culture    Component Value Date/Time   SDES BLOOD RIGHT HAND 12/14/2017 1631   SPECREQUEST  12/14/2017 1631    BOTTLES DRAWN AEROBIC AND ANAEROBIC Blood Culture adequate volume   CULT  12/14/2017 1631    NO GROWTH 5  DAYS Performed at [facility], [address] [place], [place] [address]    REPTSTATUS 12/19/2017 FINAL 12/14/2017 1631    Studies/Results: No results found. Medications: . sodium chloride    . sodium chloride    . ferric gluconate (FERRLECIT/NULECIT) IV Stopped (12/17/17 1620)  . vancomycin Stopped (12/17/17 1127)   . atorvastatin  20 mg Oral Daily  . timolol  1 drop Left Eye Daily   And  . brimonidine  1 drop Left Eye Daily  . calcitRIOL  0.25 mcg Oral Q T,Th,Sa-HD  . calcium acetate  667 mg Oral TID WC  . darbepoetin (ARANESP) injection - DIALYSIS  100 mcg Intravenous Q Wed-HD  . dicyclomine  10 mg Oral TID AC & HS  . febuxostat  40 mg Oral Daily  . feeding supplement (NEPRO CARB STEADY)  237 mL Oral Q24H  . feeding  supplement (PRO-STAT SUGAR FREE 64)  30 mL Oral BID  . folic acid  1 mg Oral Daily  . hydroxychloroquine  300 mg Oral Daily  . insulin aspart  0-9 Units Subcutaneous TID WC  . ketorolac  1 drop Left Eye QID  . latanoprost  1 drop Left Eye QHS  . metoprolol tartrate  25 mg Oral Q T,Th,Sat-1800  . metoprolol tartrate  50 mg Oral 2 times per day on Sun Mon Wed Fri  . nutrition supplement (JUVEN)  1 packet Oral BID BM  . predniSONE  5 mg Oral Q breakfast  . sodium chloride flush  3 mL Intravenous Q12H  . Vitamin D (Ergocalciferol)  50,000 Units Oral Weekly    Dialysis Orders: TTS 4 hr Reids 400/800 3 K 2.5 Ca EDW 78kg right IJ TDC std Heparin, calcitriol 0.25 Mircera 50 last 3/5 --review of records show she has been pesenting below edw for at least several treatments with post HD wt 75.9 b3/16 and a net UF of 0.7 with her lowest BP being 127/44.  Assessment/Plan: 1. RLE calf wound: arteriogram neg for arterial or venous cause of skin ulcer.  Suspect this may be calciphylaxis, it has the appearance.  Will need to hold for now all Ca++ and vit D containing products, start Na thio with OP HD and will consider skin biopsy in OP setting.  Have d/w primary. Will d/w  husband.  2. ESRD: TTS HD . Has L 1st stage BVT placed in 2015 3. Hypertension/volume: BP ok, drops with HD, getting under EDW w/ HD, likely can be lowered slightly 4. Anemia: Hgb 8.7, Aranesp 100 given 3/20 off schedule- noted to have heme +stools 2/23 during ED visit - on chronic coumadind/c niferex.Giveweekly Fe - ^ ferritin likely due to chronic wound 5. Metabolic bone disease: Phos low.  Given poss calciphylaxis will dc phoslo/ vit D and calcitriol. 6. Nutrition: Alb very low (2.2), continue Nepro/Pro-stat/Juven pwdr. 7. Afib- on chronic coumadin/MTP 8. SLE: On Plaquenil. 9. DM: On insulin, per primary 10. Dispo: ok for dc from renal standpoint after HD today     Kelly Splinter MD [place] pager [phone]   12/19/2017, 10:42 AM

## 2017-12-20 LAB — PROTIME-INR
INR: 1.19
Prothrombin Time: 15 seconds (ref 11.4–15.2)

## 2017-12-20 LAB — GLUCOSE, CAPILLARY
GLUCOSE-CAPILLARY: 89 mg/dL (ref 65–99)
Glucose-Capillary: 97 mg/dL (ref 65–99)
Glucose-Capillary: 146 mg/dL — ABNORMAL HIGH (ref 65–99)
Glucose-Capillary: 134 mg/dL — ABNORMAL HIGH (ref 65–99)

## 2017-12-20 LAB — HEPARIN LEVEL (UNFRACTIONATED)
HEPARIN UNFRACTIONATED: 0.42 [IU]/mL (ref 0.30–0.70)
Heparin Unfractionated: 0.67 IU/mL (ref 0.30–0.70)

## 2017-12-20 LAB — CBC
HCT: 28.4 % — ABNORMAL LOW (ref 36.0–46.0)
Hemoglobin: 8.6 g/dL — ABNORMAL LOW (ref 12.0–15.0)
MCH: 28.3 pg (ref 26.0–34.0)
MCHC: 30.3 g/dL (ref 30.0–36.0)
MCV: 93.4 fL (ref 78.0–100.0)
PLATELETS: 190 10*3/uL (ref 150–400)
RBC: 3.04 MIL/uL — ABNORMAL LOW (ref 3.87–5.11)
RDW: 16.5 % — AB (ref 11.5–15.5)
WBC: 8.1 10*3/uL (ref 4.0–10.5)

## 2017-12-20 MED ORDER — POLYETHYLENE GLYCOL 3350 17 G PO PACK
17.0000 g | PACK | Freq: Two times a day (BID) | ORAL | Status: DC
  Administered 2017-12-20 – 2017-12-29 (×13): 17 g via ORAL
  Filled 2017-12-20 (×16): qty 1

## 2017-12-20 MED ORDER — DARBEPOETIN ALFA 100 MCG/0.5ML IJ SOSY: 100.0000 ug | PREFILLED_SYRINGE | INTRAMUSCULAR | Status: DC

## 2017-12-20 NOTE — Plan of Care (Signed)
Progressing

## 2017-12-20 NOTE — Progress Notes (Signed)
ANTICOAGULATION CONSULT NOTE - Follow Up Consult  Pharmacy Consult for heparin Indication: Afib and h/o DVT  Labs: Recent Labs    12/17/17 0500  12/17/17 0840 12/17/17 0841 12/18/17 0627 12/19/17 0905 12/19/17 1650 12/19/17 2350  HGB  --    < > 8.7*  --  8.7* 7.9*  --   --   HCT  --   --  28.1*  --  28.6* 25.9*  --   --   PLT  --   --  220  --  191 201  --   --   LABPROT 22.5*  --   --   --  15.7*  --  14.2  --   INR 1.99  --   --   --  1.26  --  1.11  --   HEPARINUNFRC  --   --   --   --   --   --   --  0.42  CREATININE  --   --   --  2.75* 2.73* 4.40*  --   --    < > = values in this interval not displayed.     Assessment/Plan:  73yo female therapeutic on heparin with initial dosing while Coumadin on hold. Will continue gtt at current rate and confirm stable with additional level.   [name], PharmD, BCPS  12/20/2017,12:58 AM

## 2017-12-20 NOTE — Progress Notes (Signed)
ANTICOAGULATION CONSULT NOTE - Initial Consult  Pharmacy Consult for heparin Indication: atrial fibrillation and history of DVT  Allergies  Allergen Reactions  . Ace Inhibitors Cough    Patient Measurements: Height: 5\' 5"  (165.1 cm) Weight: 173 lb 4.8 oz (78.6 kg) IBW/kg (Calculated) : 57 Heparin Dosing Weight: 73.5kg  Vital Signs: Temp: 98.1 F (36.7 C) (03/24 0548) Temp Source: Oral (03/24 0548) BP: 158/43 (03/24 0548) Pulse Rate: 72 (03/24 0548)  Labs: Recent Labs    12/17/17 0841 12/18/17 0627 12/19/17 0905 12/19/17 1650 12/19/17 2350 12/20/17 0640  HGB  --  8.7* 7.9*  --   --  8.6*  HCT  --  28.6* 25.9*  --   --  28.4*  PLT  --  191 201  --   --  190  LABPROT  --  15.7*  --  14.2  --  15.0  INR  --  1.26  --  1.11  --  1.19  HEPARINUNFRC  --   --   --   --  0.42 0.67  CREATININE 2.75* 2.73* 4.40*  --   --   --     Estimated Creatinine Clearance: 12 mL/min (A) (by C-G formula based on SCr of 4.4 mg/dL (H)).  Assessment: 64 YOF with history of AFib and DVT on warfarin PTA. Warfarin was originally continued while inpatient (last dose given 3/20), but was then held due to need for vascular procedure. Home dose of warfarin 4mg  daily. 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 To bridge with heparin.  hgb 7.9, plts 190- no bleeding noted. Heparin level this morning at goal (higher end) at 0.67. No issues with heparin infusion noted.  Goal of Therapy:  Heparin level 0.3-0.7 units/ml Monitor platelets by anticoagulation protocol: Yes   Plan:  Continue heparin at 1100 units/hr Daily heparin level and CBC Follow for s/s bleeding and ability to restart warfarin  Jalene Mullet, Pharm.D. PGY1 Pharmacy Resident 12/20/2017 8:05 AM Main Pharmacy: [phone]

## 2017-12-20 NOTE — Progress Notes (Signed)
[facility] Progress Note   Subjective:  Arteriogram didn't show any vasc cause for the leg ulcers.    Objective Vitals:   12/19/17 1507 12/19/17 2133 12/20/17 0548 12/20/17 1523  BP: (!) 142/89 (!) 156/91 (!) 158/43 (!) 163/46  Pulse: 81 79 72 71  Resp: 18 17 18 16   Temp: 99.1 F (37.3 C) 98.8 F (37.1 C) 98.1 F (36.7 C) 98.1 F (36.7 C)  TempSrc: Oral Oral Oral Oral  SpO2: 100% 100% 98%   Weight: 78.6 kg (173 lb 4.8 oz)     Height: 5\' 5"  (1.651 m)      Physical Exam General: Thin female, NAD Heart: RRR; no murmur Lungs: CTAB Abdomen: mild RLQ tenderness, no rebound tenderness or guarding Extremities: No LE edema. Mild R calf tenderness Dialysis Access: R [facility]  Additional Objective Labs: Basic Metabolic Panel: Recent Labs  Lab 12/16/17 0637 12/17/17 0841 12/18/17 0627 12/19/17 0905  NA 137 134* 134* 133*  K 3.9 3.5 3.6 3.7  CL 102 97* 97* 95*  CO2 26 28 26 26   GLUCOSE 133* 114* 143* 108*  BUN 52* 30* 37* 46*  CREATININE 3.76* 2.75* 2.73* 4.40*  CALCIUM 9.1 8.8* 8.4* 8.4*  PHOS 2.1* 1.7*  --  2.5   Liver Function Tests: Recent Labs  Lab 12/14/17 1630  12/16/17 0637 12/17/17 0841 12/19/17 0905  AST 21  --   --   --   --   ALT 18  --   --   --   --   ALKPHOS 76  --   --   --   --   BILITOT 0.4  --   --   --   --   PROT 7.1  --   --   --   --   ALBUMIN 2.7*   < > 2.2* 2.2* 2.1*   < > = values in this interval not displayed.   CBC: Recent Labs  Lab 12/14/17 1630 12/15/17 0518  12/16/17 8366 12/17/17 0840 12/18/17 0627 12/19/17 0905 12/20/17 0640  WBC 12.7* 8.7   < > 8.5 8.3 6.8 9.4 8.1  NEUTROABS 10.1* 6.4  --  6.4  --   --   --   --   HGB 9.7* 8.6*   < > 8.6* 8.7* 8.7* 7.9* 8.6*  HCT 32.9* 29.0*   < > 28.7* 28.1* 28.6* 25.9* 28.4*  MCV 96.8 96.0   < > 94.4 94.3 94.1 92.8 93.4  PLT 266 228   < > 191 220 191 201 190   < > = values in this interval not displayed.   Blood Culture    Component Value Date/Time   SDES BLOOD  RIGHT HAND 12/14/2017 1631   SPECREQUEST  12/14/2017 1631    BOTTLES DRAWN AEROBIC AND ANAEROBIC Blood Culture adequate volume   CULT  12/14/2017 1631    NO GROWTH 5 DAYS Performed at [facility], [address] [place], [place] [address]    REPTSTATUS 12/19/2017 FINAL 12/14/2017 1631    Studies/Results: No results found. Medications: . sodium chloride    . ferric gluconate (FERRLECIT/NULECIT) IV Stopped (12/17/17 1620)  . heparin 1,100 Units/hr (12/20/17 1258)   . atorvastatin  20 mg Oral Daily  . timolol  1 drop Left Eye Daily   And  . brimonidine  1 drop Left Eye Daily  . [START ON 12/24/2017] darbepoetin (ARANESP) injection - DIALYSIS  100 mcg Intravenous Q Thu-HD  . dicyclomine  10 mg Oral TID  AC & HS  . febuxostat  40 mg Oral Daily  . feeding supplement (NEPRO CARB STEADY)  237 mL Oral Q24H  . feeding supplement (PRO-STAT SUGAR FREE 64)  30 mL Oral BID  . folic acid  1 mg Oral Daily  . hydroxychloroquine  300 mg Oral Daily  . insulin aspart  0-9 Units Subcutaneous TID WC  . ketorolac  1 drop Left Eye QID  . latanoprost  1 drop Left Eye QHS  . metoprolol tartrate  25 mg Oral Q T,Th,Sat-1800  . metoprolol tartrate  50 mg Oral 2 times per day on Sun Mon Wed Fri  . nutrition supplement (JUVEN)  1 packet Oral BID BM  . polyethylene glycol  17 g Oral BID  . predniSONE  5 mg Oral Q breakfast  . sodium chloride flush  3 mL Intravenous Q12H    Dialysis Orders: TTS 4 hr Reids 400/800 3 K 2.5 Ca EDW 78kg right IJ TDC std Heparin, calcitriol 0.25 Mircera 50 last 3/5 --review of records show she has been pesenting below edw for at least several treatments with post HD wt 75.9 b3/16 and a net UF of 0.7 with her lowest BP being 127/44.  Assessment/Plan: 1. RLE calf wound: arteriogram neg for arterial or venous cause of skin ulcer.  Possible calciphylaxis or warfarin skin necrosis.  Have dc'd Ca++containing and vit D meds and started Na thio with OP HD.  Gen surg will be consulted  for deep tissue skin biopsy. After biopsy done, please let pathology know to send the sample to derm pathology and that we are looking for calciphylaxis and/or warfarin skin necrosis. 2. ESRD: TTS HD . Has L 1st stage BVT placed in 2015. Seen by [facility], holding on further surgery until leg lesion diagnosis is made.  Next HD 3/26.  3. Hypertension/volume: BP ok, drops with HD, getting under EDW w/ HD, likely can be lowered slightly 4. Anemia: Hgb 8.7, Aranesp 100 given 3/20 off schedule- noted to have heme +stools 2/23 during ED visit - on chronic coumadind/c niferex.Giveweekly Fe - ^ ferritin likely due to chronic wound 5. Metabolic bone disease: Phos low.  Given poss calciphylaxis will dc phoslo/ vit D and calcitriol. 6. Nutrition: Alb very low (2.2), continue Nepro/Pro-stat/Juven pwdr. 7. Afib- on chronic coumadin/MTP 8. SLE: On Plaquenil. 9. DM: On insulin, per primary  Kelly Splinter MD [place] pager [phone]   12/20/2017, 5:33 PM

## 2017-12-20 NOTE — Progress Notes (Signed)
PROGRESS NOTE    [name]  JME:[id number] DOB: 06/11/1946 DOA: 12/14/2017 PCP: Sharilyn Sites, MD   Brief Narrative: [name] is a 72 y.o. female with a history of ESRD, COPD, atrial fibrillation, lupus, diabetes mellitus type 2, hypertension, DVT right upper extremity, presenting from her PCPs office secondary to worsening of chronic right diabetic foot ulceration with concerns of cellulitis of the right calf.  The patient states that she has had wounds in her bilateral lower extremities for several months.  She denies any recent injury.  The patient had been following up by the wound care center but stopped going in January 2019 due to a change of her health insurance. Since then, she has noticed worsening of the wound and has been having worsening pain for the past few days.   In the emergency department, the patient was noted to have WBC 12.7, but she was afebrile hemodynamically stable.  X-rays of the right foot showed possible tiny erosions at the tops of the second and third distal phalanges.  The patient was started on Vancomycin Zosyn admitted for further evaluation. September 03, 2017, she had a lower extremity arterial duplex evaluation showed a patent right superficial femoral artery with velocities suggestive of a 75-99% stenosis involving the distal segment. Patent left lower extremity arterial system without evidence of hemodynamically significant stenosis. 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 skin necrosis   Assessment & Plan:   Principal Problem:   Cellulitis in diabetic foot ([place]) Active Problems:   Atrial flutter (HCC)   Type 2 diabetes mellitus with diabetic neuropathy (HCC)   SLE (systemic lupus erythematosus) (HCC)   Essential hypertension, benign   COPD (chronic obstructive pulmonary disease) (HCC)   Anemia of chronic renal failure, stage 4 (severe) (HCC)   Mixed hyperlipidemia   ESRD (end stage renal disease) on dialysis [facility])   Atrial  fibrillation (HCC)   Glaucoma   Ischemic ulcer of toes on both feet (HCC)   Right leg wound Unlikely to be cellulitis. Differential now includes calciphylaxis vs warfarin skin necrosis. Coumadin held. Calcium/vitamin D containing products discontinued per nephrology -Nephrology recs: sodium thio -General surgery consulted for skin biopsy on 3/25  Atrial fibrillation CHA2DS2-VASc Score is 4. Rate controlled. Coumadin held secondary to vascular procedure. Now on hold secondary to possible warfarin skin necrosis -Heparin gtt per pharmacy -Continue metoprolol  ESRD -Nephrology for HD  Essential hypertension -Continue metoprolol  SLE -Continue Plaquenil and prednisone  Diabetes mellitus, type 2 Last hemoglobin A1C of 5.8%. Levemir held. -Continue SSI  COPD Stable.  Abdominal pain Restarted today. This has been worked up for the last few months without an etiology. Patient without a bowel movement in a while -Miralax   DVT prophylaxis: Heparin gtt Code Status:   Code Status: Full Code Family Communication: Husband Disposition Plan: Pending workup/biopsy of wound   Consultants:   Nephrology  Vascular surgery  General surgery  Procedures:   Arteriogram  Antimicrobials:  Vancomycin (3/18>>3/23)  Zosyn (3/18>>3/19)   Subjective: Right LQ abdominal pain. Right calf pain improved with analgesics  Objective: Vitals:   12/19/17 1233 12/19/17 1507 12/19/17 2133 12/20/17 0548  BP: (!) 147/47 (!) 142/89 (!) 156/91 (!) 158/43  Pulse: 81 81 79 72  Resp: 18 18 17 18   Temp: 98.4 F (36.9 C) 99.1 F (37.3 C) 98.8 F (37.1 C) 98.1 F (36.7 C)  TempSrc: Oral Oral Oral Oral  SpO2: 98% 100% 100% 98%  Weight: 76.8 kg (169 lb 5 oz) 78.6  kg (173 lb 4.8 oz)    Height:  5\' 5"  (1.651 m)      Intake/Output Summary (Last 24 hours) at 12/20/2017 1206 Last data filed at 12/20/2017 0551 Gross per 24 hour  Intake 265.93 ml  Output 550 ml  Net -284.07 ml   Filed Weights    12/19/17 0816 12/19/17 1233 12/19/17 1507  Weight: 77.2 kg (170 lb 3.1 oz) 76.8 kg (169 lb 5 oz) 78.6 kg (173 lb 4.8 oz)    Examination:  Physical Exam  Constitutional: She is oriented to person, place, and time.  Cardiovascular: Normal rate and regular rhythm. Exam reveals no gallop and no friction rub.  Murmur heard.  Systolic murmur is present with a grade of 2/6. Pulmonary/Chest: Effort normal and breath sounds normal.  Abdominal: Soft. She exhibits no distension. There is tenderness. There is no rebound and no guarding.  Musculoskeletal: Normal range of motion. She exhibits no edema.  Neurological: She is alert and oriented to person, place, and time.  Skin: Skin is warm and dry. Lesion (over right calf) noted.     Data Reviewed: I have personally reviewed following labs and imaging studies  CBC: Recent Labs  Lab 12/14/17 1630 12/15/17 0518  12/16/17 2633 12/17/17 0840 12/18/17 0627 12/19/17 0905 12/20/17 0640  WBC 12.7* 8.7   < > 8.5 8.3 6.8 9.4 8.1  NEUTROABS 10.1* 6.4  --  6.4  --   --   --   --   HGB 9.7* 8.6*   < > 8.6* 8.7* 8.7* 7.9* 8.6*  HCT 32.9* 29.0*   < > 28.7* 28.1* 28.6* 25.9* 28.4*  MCV 96.8 96.0   < > 94.4 94.3 94.1 92.8 93.4  PLT 266 228   < > 191 220 191 201 190   < > = values in this interval not displayed.   Basic Metabolic Panel: Recent Labs  Lab 12/15/17 1758 12/16/17 0637 12/17/17 0841 12/18/17 0627 12/19/17 0905  NA 136 137 134* 134* 133*  K 4.4 3.9 3.5 3.6 3.7  CL 102 102 97* 97* 95*  CO2 24 26 28 26 26   GLUCOSE 208* 133* 114* 143* 108*  BUN 47* 52* 30* 37* 46*  CREATININE 3.92* 3.76* 2.75* 2.73* 4.40*  CALCIUM 9.1 9.1 8.8* 8.4* 8.4*  PHOS 2.2* 2.1* 1.7*  --  2.5   GFR: Estimated Creatinine Clearance: 12 mL/min (A) (by C-G formula based on SCr of 4.4 mg/dL (H)). Liver Function Tests: Recent Labs  Lab 12/14/17 1630 12/15/17 1758 12/16/17 0637 12/17/17 0841 12/19/17 0905  AST 21  --   --   --   --   ALT 18  --   --    --   --   ALKPHOS 76  --   --   --   --   BILITOT 0.4  --   --   --   --   PROT 7.1  --   --   --   --   ALBUMIN 2.7* 2.3* 2.2* 2.2* 2.1*   No results for input(s): LIPASE, AMYLASE in the last 168 hours. No results for input(s): AMMONIA in the last 168 hours. Coagulation Profile: Recent Labs  Lab 12/16/17 0637 12/17/17 0500 12/18/17 0627 12/19/17 1650 12/20/17 0640  INR 1.83 1.99 1.26 1.11 1.19   Cardiac Enzymes: No results for input(s): CKTOTAL, CKMB, CKMBINDEX, TROPONINI in the last 168 hours. BNP (last 3 results) No results for input(s): PROBNP in the last 8760 hours. HbA1C: No results  for input(s): HGBA1C in the last 72 hours. CBG: Recent Labs  Lab 12/19/17 0619 12/19/17 1746 12/19/17 2136 12/20/17 0919 12/20/17 1155  GLUCAP 135* 96 160* 89 97   Lipid Profile: No results for input(s): CHOL, HDL, LDLCALC, TRIG, CHOLHDL, LDLDIRECT in the last 72 hours. Thyroid Function Tests: No results for input(s): TSH, T4TOTAL, FREET4, T3FREE, THYROIDAB in the last 72 hours. Anemia Panel: No results for input(s): VITAMINB12, FOLATE, FERRITIN, TIBC, IRON, RETICCTPCT in the last 72 hours. Sepsis Labs: Recent Labs  Lab 12/14/17 1631  LATICACIDVEN 1.2    Recent Results (from the past 240 hour(s))  Culture, blood (Routine x 2)     Status: None   Collection Time: 12/14/17  4:30 PM  Result Value Ref Range Status   Specimen Description RIGHT ANTECUBITAL  Final   Special Requests   Final    BOTTLES DRAWN AEROBIC AND ANAEROBIC Blood Culture adequate volume   Culture   Final    NO GROWTH 5 DAYS Performed at [facility], [address], [place], [place] [address]    Report Status 12/19/2017 FINAL  Final  Culture, blood (Routine x 2)     Status: None   Collection Time: 12/14/17  4:31 PM  Result Value Ref Range Status   Specimen Description BLOOD RIGHT HAND  Final   Special Requests   Final    BOTTLES DRAWN AEROBIC AND ANAEROBIC Blood Culture adequate volume   Culture   Final      NO GROWTH 5 DAYS Performed at [facility], [address] [place], [place] [address]    Report Status 12/19/2017 FINAL  Final  MRSA PCR Screening     Status: None   Collection Time: 12/15/17  9:15 AM  Result Value Ref Range Status   MRSA by PCR NEGATIVE NEGATIVE Final    Comment:        The GeneXpert MRSA Assay (FDA approved for NASAL specimens only), is one component of a comprehensive MRSA colonization surveillance program. It is not intended to diagnose MRSA infection nor to guide or monitor treatment for MRSA infections. Performed at [facility], [address], [place], [place] [address]   Surgical pcr screen     Status: None   Collection Time: 12/17/17  7:49 PM  Result Value Ref Range Status   MRSA, PCR NEGATIVE NEGATIVE Final   Staphylococcus aureus NEGATIVE NEGATIVE Final    Comment: (NOTE) The Xpert SA Assay (FDA approved for NASAL specimens in patients 63 years of age and older), is one component of a comprehensive surveillance program. It is not intended to diagnose infection nor to guide or monitor treatment. Performed at [facility], [place] [address], [place], [place] [address]          Radiology Studies: No results found.      Scheduled Meds: . atorvastatin  20 mg Oral Daily  . timolol  1 drop Left Eye Daily   And  . brimonidine  1 drop Left Eye Daily  . [START ON 12/24/2017] darbepoetin (ARANESP) injection - DIALYSIS  100 mcg Intravenous Q Thu-HD  . dicyclomine  10 mg Oral TID AC & HS  . febuxostat  40 mg Oral Daily  . feeding supplement (NEPRO CARB STEADY)  237 mL Oral Q24H  . feeding supplement (PRO-STAT SUGAR FREE 64)  30 mL Oral BID  . folic acid  1 mg Oral Daily  . hydroxychloroquine  300 mg Oral Daily  . insulin aspart  0-9 Units Subcutaneous TID  WC  . ketorolac  1 drop Left Eye QID  . latanoprost  1 drop Left Eye QHS  . metoprolol tartrate  25 mg Oral Q T,Th,Sat-1800  . metoprolol tartrate  50 mg Oral 2 times per day on Sun  Mon Wed Fri  . nutrition supplement (JUVEN)  1 packet Oral BID BM  . polyethylene glycol  17 g Oral BID  . predniSONE  5 mg Oral Q breakfast  . sodium chloride flush  3 mL Intravenous Q12H   Continuous Infusions: . sodium chloride    . ferric gluconate (FERRLECIT/NULECIT) IV Stopped (12/17/17 1620)  . heparin 1,100 Units/hr (12/19/17 1635)     LOS: 6 days     Cordelia Poche, MD [facility] 12/20/2017, 12:06 PM Pager: [phone]  If 7PM-7AM, please contact night-coverage [email or web address] Password [facility] 12/20/2017, 12:06 PM

## 2017-12-21 ENCOUNTER — Ambulatory Visit: Payer: Medicare Other | Admitting: Cardiology

## 2017-12-21 LAB — GLUCOSE, CAPILLARY
GLUCOSE-CAPILLARY: 102 mg/dL — AB (ref 65–99)
GLUCOSE-CAPILLARY: 148 mg/dL — AB (ref 65–99)
GLUCOSE-CAPILLARY: 124 mg/dL — AB (ref 65–99)
Glucose-Capillary: 124 mg/dL — ABNORMAL HIGH (ref 65–99)
Glucose-Capillary: 90 mg/dL (ref 65–99)

## 2017-12-21 LAB — RENAL FUNCTION PANEL
ANION GAP: 10 (ref 5–15)
Albumin: 2.2 g/dL — ABNORMAL LOW (ref 3.5–5.0)
BUN: 48 mg/dL — ABNORMAL HIGH (ref 6–20)
CHLORIDE: 95 mmol/L — AB (ref 101–111)
CO2: 26 mmol/L (ref 22–32)
Calcium: 8.4 mg/dL — ABNORMAL LOW (ref 8.9–10.3)
Creatinine, Ser: 4.8 mg/dL — ABNORMAL HIGH (ref 0.44–1.00)
GFR calc Af Amer: 10 mL/min — ABNORMAL LOW (ref >60)
GFR calc non Af Amer: 8 mL/min — ABNORMAL LOW (ref >60)
GLUCOSE: 136 mg/dL — AB (ref 65–99)
POTASSIUM: 3.4 mmol/L — AB (ref 3.5–5.1)
Phosphorus: 2.9 mg/dL (ref 2.5–4.6)
Sodium: 131 mmol/L — ABNORMAL LOW (ref 135–145)

## 2017-12-21 MED ORDER — SODIUM THIOSULFATE 25 % IV SOLN
25.0000 g | INTRAVENOUS | Status: DC
  Administered 2017-12-22: 25 g via INTRAVENOUS
  Filled 2017-12-21 (×3): qty 100

## 2017-12-21 MED ORDER — HEPARIN (PORCINE) IN NACL 100-0.45 UNIT/ML-% IJ SOLN
950.0000 [IU]/h | INTRAMUSCULAR | Status: DC
  Administered 2017-12-21: 1100 [IU]/h via INTRAVENOUS
  Filled 2017-12-21: qty 250

## 2017-12-21 MED ORDER — LIDOCAINE HCL (PF) 2 % IJ SOLN
0.0000 mL | Freq: Once | INTRAMUSCULAR | Status: DC | PRN
  Filled 2017-12-21 (×3): qty 20

## 2017-12-21 MED FILL — Lidocaine HCl Local Inj 1%: INTRAMUSCULAR | Qty: 20 | Status: AC

## 2017-12-21 MED FILL — Morphine Sulfate Inj 4 MG/ML: INTRAMUSCULAR | Qty: 0.5 | Status: AC

## 2017-12-21 NOTE — Progress Notes (Signed)
ANTICOAGULATION CONSULT NOTE - [place] for heparin Indication: atrial fibrillation and history of DVT  Allergies  Allergen Reactions  . Ace Inhibitors Cough    Patient Measurements: Height: 5\' 5"  (165.1 cm) Weight: 166 lb 14.2 oz (75.7 kg) IBW/kg (Calculated) : 57 Heparin Dosing Weight: 73.5kg  Vital Signs: Temp: 98.5 F (36.9 C) (03/25 0556) Temp Source: Oral (03/25 0556) BP: 163/44 (03/25 1000) Pulse Rate: 62 (03/25 1000)  Labs: Recent Labs    12/19/17 0905 12/19/17 1650 12/19/17 2350 12/20/17 0640  HGB 7.9*  --   --  8.6*  HCT 25.9*  --   --  28.4*  PLT 201  --   --  190  LABPROT  --  14.2  --  15.0  INR  --  1.11  --  1.19  HEPARINUNFRC  --   --  0.42 0.67  CREATININE 4.40*  --   --   --     Estimated Creatinine Clearance: 11.8 mL/min (A) (by C-G formula based on SCr of 4.4 mg/dL (H)).  Assessment: 72 yo F with history of AFib and DVT on warfarin PTA. Warfarin was originally continued while inpatient (last dose given 3/20), but was then held due to need for vascular procedure. Home dose of warfarin 4mg  daily. Patient did receive vitamin K 5mg  PO x1 on 3/21 in preparation for vascular procedure  Coumadin remains on hold given suspicion of calciphylaxis vs warfarin induced skin necrosis. Pt s/p skin punch biopsy 3/25.  To bridge with heparin.  Will restart at previously therapeutic rate.  Goal of Therapy:  Heparin level 0.3-0.7 units/ml Monitor platelets by anticoagulation protocol: Yes   Plan:  Restart heparin at 1100 units/hr (at 1345 today) Heparin level at 2200 for confirmation. Daily heparin level and CBC Follow for s/s bleeding and ability to restart warfarin  [employer], Pharm.D., BCPS Clinical Pharmacist Pager: [phone] Clinical phone for 12/21/2017 from 8:30-4:00 is [phone]. After 4pm, please call Main Rx [phone]) for assistance. 12/21/2017 2:11 PM

## 2017-12-21 NOTE — Care Management Important Message (Signed)
Important Message  Patient Details  Name: [name] MRN: [id number] Date of Birth: Aug 19, 1946   Medicare Important Message Given:  Yes    Orbie Pyo 12/21/2017, 2:58 PM

## 2017-12-21 NOTE — Progress Notes (Signed)
PROGRESS NOTE    KATHARINE ROCHEFORT  FGH:[id number] DOB: 1946/07/22 DOA: 12/14/2017 PCP: Sharilyn Sites, MD   Brief Narrative: [name] is a 72 y.o. female with a history of ESRD, COPD, atrial fibrillation, lupus, diabetes mellitus type 2, hypertension, DVT right upper extremity, presenting from her PCPs office secondary to worsening of chronic right diabetic foot ulceration with concerns of cellulitis of the right calf.  The patient states that she has had wounds in her bilateral lower extremities for several months.  She denies any recent injury.  The patient had been following up by the wound care center but stopped going in January 2019 due to a change of her health insurance. Since then, she has noticed worsening of the wound and has been having worsening pain for the past few days.   In the emergency department, the patient was noted to have WBC 12.7, but she was afebrile hemodynamically stable.  X-rays of the right foot showed possible tiny erosions at the tops of the second and third distal phalanges.  The patient was started on Vancomycin Zosyn admitted for further evaluation. September 03, 2017, she had a lower extremity arterial duplex evaluation showed a patent right superficial femoral artery with velocities suggestive of a 75-99% stenosis involving the distal segment. Patent left lower extremity arterial system without evidence of hemodynamically significant stenosis. 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 skin necrosis   Assessment & Plan:   Principal Problem:   Cellulitis in diabetic foot ([place]) Active Problems:   Atrial flutter (HCC)   Type 2 diabetes mellitus with diabetic neuropathy (HCC)   SLE (systemic lupus erythematosus) (HCC)   Essential hypertension, benign   COPD (chronic obstructive pulmonary disease) (HCC)   Anemia of chronic renal failure, stage 4 (severe) (HCC)   Mixed hyperlipidemia   ESRD (end stage renal disease) on dialysis [facility])   Atrial  fibrillation (HCC)   Glaucoma   Ischemic ulcer of toes on both feet (HCC)   Right leg wound Unlikely to be cellulitis. Differential now includes calciphylaxis vs warfarin skin necrosis. Coumadin held. Calcium/vitamin D containing products discontinued per nephrology -Nephrology recs: sodium thio -General surgery to perform biopsy today  Atrial fibrillation CHA2DS2-VASc Score is 4. Rate controlled. Coumadin held secondary to vascular procedure. Now on hold secondary to possible warfarin skin necrosis -Heparin gtt per pharmacy -Continue metoprolol  ESRD -Nephrology for HD  Essential hypertension -Continue metoprolol  SLE -Continue Plaquenil and prednisone  Diabetes mellitus, type 2 Last hemoglobin A1C of 5.8%. Levemir held. -Continue SSI  COPD Stable.  Abdominal pain Better today. This has been worked up for the last few months without an etiology. Patient without a bowel movement in a while -Miralax   DVT prophylaxis: Heparin gtt Code Status:   Code Status: Full Code Family Communication: None at bedside Disposition Plan: Discharge home when biopsy results available   Consultants:   Nephrology  Vascular surgery  General surgery  Procedures:   Arteriogram  Antimicrobials:  Vancomycin (3/18>>3/23)  Zosyn (3/18>>3/19)   Subjective: Leg pain improved with analgesics.  Objective: Vitals:   12/20/17 2133 12/20/17 2229 12/21/17 0556 12/21/17 1000  BP: (!) 174/35 (!) 181/55 (!) 162/33 (!) 163/44  Pulse: 73  64 62  Resp: 16  18   Temp: 98 F (36.7 C)  98.5 F (36.9 C)   TempSrc: Oral  Oral   SpO2: 100%  100%   Weight:   75.7 kg (166 lb 14.2 oz)   Height:  Intake/Output Summary (Last 24 hours) at 12/21/2017 1314 Last data filed at 12/20/2017 1400 Gross per 24 hour  Intake 89.65 ml  Output -  Net 89.65 ml   Filed Weights   12/19/17 1233 12/19/17 1507 12/21/17 0556  Weight: 76.8 kg (169 lb 5 oz) 78.6 kg (173 lb 4.8 oz) 75.7 kg (166 lb  14.2 oz)    Examination:  General exam: Appears calm and comfortable Respiratory system: Clear to auscultation. Respiratory effort normal. Cardiovascular system: S1 & S2 heard, RRR. 2/6 systolic murmur Gastrointestinal system: Abdomen is nondistended, soft and nontender. No organomegaly or masses felt. Normal bowel sounds heard. Central nervous system: Alert and oriented. No focal neurological deficits. Extremities: No edema. No calf tenderness Skin: No cyanosis. Large ulcerative lesion on posterior right leg Psychiatry: Judgement and insight appear normal. Mood & affect appropriate.    Data Reviewed: I have personally reviewed following labs and imaging studies  CBC: Recent Labs  Lab 12/14/17 1630 12/15/17 0518  12/16/17 5427 12/17/17 0840 12/18/17 0627 12/19/17 0905 12/20/17 0640  WBC 12.7* 8.7   < > 8.5 8.3 6.8 9.4 8.1  NEUTROABS 10.1* 6.4  --  6.4  --   --   --   --   HGB 9.7* 8.6*   < > 8.6* 8.7* 8.7* 7.9* 8.6*  HCT 32.9* 29.0*   < > 28.7* 28.1* 28.6* 25.9* 28.4*  MCV 96.8 96.0   < > 94.4 94.3 94.1 92.8 93.4  PLT 266 228   < > 191 220 191 201 190   < > = values in this interval not displayed.   Basic Metabolic Panel: Recent Labs  Lab 12/15/17 1758 12/16/17 0637 12/17/17 0841 12/18/17 0627 12/19/17 0905  NA 136 137 134* 134* 133*  K 4.4 3.9 3.5 3.6 3.7  CL 102 102 97* 97* 95*  CO2 24 26 28 26 26   GLUCOSE 208* 133* 114* 143* 108*  BUN 47* 52* 30* 37* 46*  CREATININE 3.92* 3.76* 2.75* 2.73* 4.40*  CALCIUM 9.1 9.1 8.8* 8.4* 8.4*  PHOS 2.2* 2.1* 1.7*  --  2.5   GFR: Estimated Creatinine Clearance: 11.8 mL/min (A) (by C-G formula based on SCr of 4.4 mg/dL (H)). Liver Function Tests: Recent Labs  Lab 12/14/17 1630 12/15/17 1758 12/16/17 0637 12/17/17 0841 12/19/17 0905  AST 21  --   --   --   --   ALT 18  --   --   --   --   ALKPHOS 76  --   --   --   --   BILITOT 0.4  --   --   --   --   PROT 7.1  --   --   --   --   ALBUMIN 2.7* 2.3* 2.2* 2.2* 2.1*     No results for input(s): LIPASE, AMYLASE in the last 168 hours. No results for input(s): AMMONIA in the last 168 hours. Coagulation Profile: Recent Labs  Lab 12/16/17 0637 12/17/17 0500 12/18/17 0627 12/19/17 1650 12/20/17 0640  INR 1.83 1.99 1.26 1.11 1.19   Cardiac Enzymes: No results for input(s): CKTOTAL, CKMB, CKMBINDEX, TROPONINI in the last 168 hours. BNP (last 3 results) No results for input(s): PROBNP in the last 8760 hours. HbA1C: No results for input(s): HGBA1C in the last 72 hours. CBG: Recent Labs  Lab 12/20/17 0919 12/20/17 1155 12/20/17 1721 12/20/17 2138 12/21/17 0749  GLUCAP 89 97 146* 134* 90   Lipid Profile: No results for input(s): CHOL, HDL, LDLCALC,  TRIG, CHOLHDL, LDLDIRECT in the last 72 hours. Thyroid Function Tests: No results for input(s): TSH, T4TOTAL, FREET4, T3FREE, THYROIDAB in the last 72 hours. Anemia Panel: No results for input(s): VITAMINB12, FOLATE, FERRITIN, TIBC, IRON, RETICCTPCT in the last 72 hours. Sepsis Labs: Recent Labs  Lab 12/14/17 1631  LATICACIDVEN 1.2    Recent Results (from the past 240 hour(s))  Culture, blood (Routine x 2)     Status: None   Collection Time: 12/14/17  4:30 PM  Result Value Ref Range Status   Specimen Description RIGHT ANTECUBITAL  Final   Special Requests   Final    BOTTLES DRAWN AEROBIC AND ANAEROBIC Blood Culture adequate volume   Culture   Final    NO GROWTH 5 DAYS Performed at [facility], [address], [place], [place] [address]    Report Status 12/19/2017 FINAL  Final  Culture, blood (Routine x 2)     Status: None   Collection Time: 12/14/17  4:31 PM  Result Value Ref Range Status   Specimen Description BLOOD RIGHT HAND  Final   Special Requests   Final    BOTTLES DRAWN AEROBIC AND ANAEROBIC Blood Culture adequate volume   Culture   Final    NO GROWTH 5 DAYS Performed at [facility], [address], [place], [place] [address]    Report Status 12/19/2017 FINAL  Final  MRSA  PCR Screening     Status: None   Collection Time: 12/15/17  9:15 AM  Result Value Ref Range Status   MRSA by PCR NEGATIVE NEGATIVE Final    Comment:        The GeneXpert MRSA Assay (FDA approved for NASAL specimens only), is one component of a comprehensive MRSA colonization surveillance program. It is not intended to diagnose MRSA infection nor to guide or monitor treatment for MRSA infections. Performed at [facility], [address], [place], [place] [address]   Surgical pcr screen     Status: None   Collection Time: 12/17/17  7:49 PM  Result Value Ref Range Status   MRSA, PCR NEGATIVE NEGATIVE Final   Staphylococcus aureus NEGATIVE NEGATIVE Final    Comment: (NOTE) The Xpert SA Assay (FDA approved for NASAL specimens in patients 39 years of age and older), is one component of a comprehensive surveillance program. It is not intended to diagnose infection nor to guide or monitor treatment. Performed at [facility], [place] [address], [place], [place] [address]          Radiology Studies: No results found.      Scheduled Meds: . atorvastatin  20 mg Oral Daily  . timolol  1 drop Left Eye Daily   And  . brimonidine  1 drop Left Eye Daily  . [START ON 12/24/2017] darbepoetin (ARANESP) injection - DIALYSIS  100 mcg Intravenous Q Thu-HD  . dicyclomine  10 mg Oral TID AC & HS  . febuxostat  40 mg Oral Daily  . feeding supplement (NEPRO CARB STEADY)  237 mL Oral Q24H  . feeding supplement (PRO-STAT SUGAR FREE 64)  30 mL Oral BID  . folic acid  1 mg Oral Daily  . hydroxychloroquine  300 mg Oral Daily  . insulin aspart  0-9 Units Subcutaneous TID WC  . ketorolac  1 drop Left Eye QID  . latanoprost  1 drop Left Eye QHS  . metoprolol tartrate  25 mg Oral Q T,Th,Sat-1800  . metoprolol tartrate  50 mg Oral 2 times per day on  Sun Mon Wed Fri  . nutrition supplement (JUVEN)  1 packet Oral BID BM  . polyethylene glycol  17 g Oral BID  . predniSONE  5 mg Oral Q  breakfast  . sodium chloride flush  3 mL Intravenous Q12H   Continuous Infusions: . sodium chloride    . ferric gluconate (FERRLECIT/NULECIT) IV Stopped (12/17/17 1620)  . heparin       LOS: 7 days     Cordelia Poche, MD [facility] 12/21/2017, 1:14 PM Pager: [phone]  If 7PM-7AM, please contact night-coverage [email or web address] Password [email or web address] 12/21/2017, 1:14 PM

## 2017-12-21 NOTE — Progress Notes (Signed)
  Skin Biopsy Procedure Note  Procedure: Punch biopsy  Pre-operative Diagnosis: RLE nonhealing ulcer  Post-procedure Diagnosis: same  Indications: RLE nonhealing ulcer, rule out coumadin necrosis/ rule out calciphalaxis  Anesthesia: lidocaine 1%  Procedure Details  The procedure, risks and complications have been discussed in detail (including, but not limited to pain, infection, bleeding) with the patient, and the patient wishesto proceed withthe procedure. The skin was sterilely prepped over the affected area in the usual fashion. Using a 76mm punch a skin biopsy was taken from ulcer on posterior right lower leg. Specimen placed in formalin specimen cup. Pressure held until hemostasis reached. Single 4-0 prolene suture used to close wound. Dry dressing applied. The patient was observed until stable. There were no complications, and the patient tolerated the procedure well.  Plan: Restart heparin drip 1 hour post procedure (ie 1345). Leave dressing in place x24 hours. Ok to Games developer. There is a single suture in place that will need to be removed in 5-7 days.  BROOKE A MEUTH

## 2017-12-21 NOTE — Consult Note (Signed)
   [facility] CM Inpatient Consult   12/21/2017  [name] 01-27-1946 [id number]    Patient screened for [place] Management services due to high unplanned risk of hospital readmission score.   Went to bedside to speak with [name] about [place] Management services. [name] pleasantly declines [facility] Care Management follow up.   States she lives with her husband at home. States her niece helps her out everyday. Denies having any difficulty with medication or transportation.   Provided [facility] Care Management brochure with contact information to call should she change her mind in the future.  Will make inpatient RNCM aware [place] Management services were declined.    Marthenia Rolling, MSN-Ed, RN,BSN [facility] Liaison [phone]

## 2017-12-21 NOTE — Progress Notes (Signed)
[name] Progress Note   Subjective:  For biopsy of leg ulcer today.   Objective Vitals:   12/20/17 2133 12/20/17 2229 12/21/17 0556 12/21/17 1000  BP: (!) 174/35 (!) 181/55 (!) 162/33 (!) 163/44  Pulse: 73  64 62  Resp: 16  18   Temp: 98 F (36.7 C)  98.5 F (36.9 C)   TempSrc: Oral  Oral   SpO2: 100%  100%   Weight:   75.7 kg (166 lb 14.2 oz)   Height:       Physical Exam General: NAD Heart: RRR; no murmur Lungs: CTAB Abdomen: mild RLQ tenderness, no rebound tenderness or guarding Extremities: No LE edema. Mild R calf tenderness- 8 x 3 (approximately) cm area of necrosis with clearly demarcated edges. Dialysis Access: R TDC, LUE AVF which is not matured  Additional Objective Labs: Basic Metabolic Panel: Recent Labs  Lab 12/16/17 0637 12/17/17 0841 12/18/17 0627 12/19/17 0905  NA 137 134* 134* 133*  K 3.9 3.5 3.6 3.7  CL 102 97* 97* 95*  CO2 26 28 26 26   GLUCOSE 133* 114* 143* 108*  BUN 52* 30* 37* 46*  CREATININE 3.76* 2.75* 2.73* 4.40*  CALCIUM 9.1 8.8* 8.4* 8.4*  PHOS 2.1* 1.7*  --  2.5   Liver Function Tests: Recent Labs  Lab 12/14/17 1630  12/16/17 0637 12/17/17 0841 12/19/17 0905  AST 21  --   --   --   --   ALT 18  --   --   --   --   ALKPHOS 76  --   --   --   --   BILITOT 0.4  --   --   --   --   PROT 7.1  --   --   --   --   ALBUMIN 2.7*   < > 2.2* 2.2* 2.1*   < > = values in this interval not displayed.   CBC: Recent Labs  Lab 12/14/17 1630 12/15/17 0518  12/16/17 7494 12/17/17 0840 12/18/17 0627 12/19/17 0905 12/20/17 0640  WBC 12.7* 8.7   < > 8.5 8.3 6.8 9.4 8.1  NEUTROABS 10.1* 6.4  --  6.4  --   --   --   --   HGB 9.7* 8.6*   < > 8.6* 8.7* 8.7* 7.9* 8.6*  HCT 32.9* 29.0*   < > 28.7* 28.1* 28.6* 25.9* 28.4*  MCV 96.8 96.0   < > 94.4 94.3 94.1 92.8 93.4  PLT 266 228   < > 191 220 191 201 190   < > = values in this interval not displayed.   Blood Culture    Component Value Date/Time   SDES BLOOD RIGHT  HAND 12/14/2017 1631   SPECREQUEST  12/14/2017 1631    BOTTLES DRAWN AEROBIC AND ANAEROBIC Blood Culture adequate volume   CULT  12/14/2017 1631    NO GROWTH 5 DAYS Performed at [facility], [address] [place], [place] [address]    REPTSTATUS 12/19/2017 FINAL 12/14/2017 1631    Studies/Results: No results found. Medications: . sodium chloride    . ferric gluconate (FERRLECIT/NULECIT) IV Stopped (12/17/17 1620)   . atorvastatin  20 mg Oral Daily  . timolol  1 drop Left Eye Daily   And  . brimonidine  1 drop Left Eye Daily  . [START ON 12/24/2017] darbepoetin (ARANESP) injection - DIALYSIS  100 mcg Intravenous Q Thu-HD  . dicyclomine  10 mg Oral TID AC & HS  .  febuxostat  40 mg Oral Daily  . feeding supplement (NEPRO CARB STEADY)  237 mL Oral Q24H  . feeding supplement (PRO-STAT SUGAR FREE 64)  30 mL Oral BID  . folic acid  1 mg Oral Daily  . hydroxychloroquine  300 mg Oral Daily  . insulin aspart  0-9 Units Subcutaneous TID WC  . ketorolac  1 drop Left Eye QID  . latanoprost  1 drop Left Eye QHS  . metoprolol tartrate  25 mg Oral Q T,Th,Sat-1800  . metoprolol tartrate  50 mg Oral 2 times per day on Sun Mon Wed Fri  . nutrition supplement (JUVEN)  1 packet Oral BID BM  . polyethylene glycol  17 g Oral BID  . predniSONE  5 mg Oral Q breakfast  . sodium chloride flush  3 mL Intravenous Q12H    Dialysis Orders: TTS 4 hr Reids 400/800 3 K 2.5 Ca EDW 78kg right IJ TDC std Heparin, calcitriol 0.25 Mircera 50 last 3/5 --review of records show she has been pesenting below edw for at least several treatments with post HD wt 75.9 b3/16 and a net UF of 0.7 with her lowest BP being 127/44.  Assessment/Plan: 1. RLE calf wound: arteriogram neg for arterial or venous cause of skin ulcer.  Possible calciphylaxis or warfarin skin necrosis.  Have dc'd Ca++containing and vit D meds and started Na thio with OP HD.  Gen surg will be consulted for deep tissue skin biopsy. After biopsy done,  please let pathology know to send the sample to derm pathology and that we are looking for calciphylaxis and/or warfarin skin necrosis. 2. ESRD: TTS HD . Has L 1st stage BVT placed in 2015.  Seen by VVS, holding on further surgery until leg lesion diagnosis is made.  Next HD 3/26.  3. Hypertension/volume: BP ok, drops with HD, getting under EDW w/ HD, likely can be lowered slightly 4. Anemia: Hgb 8.7, Aranesp 100 given 3/20 off schedule- noted to have heme +stools 2/23 during ED visit - on chronic coumadind/c niferex.Giveweekly Fe - ^ ferritin likely due to chronic wound 5. Metabolic bone disease: Phos low.  Given poss calciphylaxis will dc phoslo/ vit D and calcitriol. 6. Nutrition: Alb very low (2.2), continue Nepro/Pro-stat/Juven pwdr. 7. Afib- on chronic coumadin/MTP 8. SLE: On Plaquenil. 9. DM: On insulin, per primary  [place] pgr [phone] 12/21/2017, 11:51 AM

## 2017-12-22 LAB — RENAL FUNCTION PANEL
ANION GAP: 13 (ref 5–15)
Albumin: 2.2 g/dL — ABNORMAL LOW (ref 3.5–5.0)
BUN: 70 mg/dL — ABNORMAL HIGH (ref 6–20)
CHLORIDE: 91 mmol/L — AB (ref 101–111)
CO2: 25 mmol/L (ref 22–32)
Calcium: 8.6 mg/dL — ABNORMAL LOW (ref 8.9–10.3)
Creatinine, Ser: 5.27 mg/dL — ABNORMAL HIGH (ref 0.44–1.00)
GFR calc non Af Amer: 7 mL/min — ABNORMAL LOW (ref >60)
GFR, EST AFRICAN AMERICAN: 9 mL/min — AB (ref >60)
GLUCOSE: 127 mg/dL — AB (ref 65–99)
Phosphorus: 3 mg/dL (ref 2.5–4.6)
Potassium: 3.8 mmol/L (ref 3.5–5.1)
Sodium: 129 mmol/L — ABNORMAL LOW (ref 135–145)

## 2017-12-22 LAB — CBC
HEMATOCRIT: 26.2 % — AB (ref 36.0–46.0)
Hemoglobin: 8.1 g/dL — ABNORMAL LOW (ref 12.0–15.0)
MCH: 27.9 pg (ref 26.0–34.0)
MCHC: 30.9 g/dL (ref 30.0–36.0)
MCV: 90.3 fL (ref 78.0–100.0)
Platelets: 219 10*3/uL (ref 150–400)
RBC: 2.9 MIL/uL — AB (ref 3.87–5.11)
RDW: 16.1 % — ABNORMAL HIGH (ref 11.5–15.5)
WBC: 9.1 10*3/uL (ref 4.0–10.5)

## 2017-12-22 LAB — GLUCOSE, CAPILLARY
GLUCOSE-CAPILLARY: 87 mg/dL (ref 65–99)
GLUCOSE-CAPILLARY: 120 mg/dL — AB (ref 65–99)
GLUCOSE-CAPILLARY: 151 mg/dL — AB (ref 65–99)

## 2017-12-22 LAB — HEPARIN LEVEL (UNFRACTIONATED): Heparin Unfractionated: 0.95 IU/mL — ABNORMAL HIGH (ref 0.30–0.70)

## 2017-12-22 MED ORDER — APIXABAN 5 MG PO TABS
5.0000 mg | ORAL_TABLET | Freq: Two times a day (BID) | ORAL | Status: DC
  Administered 2017-12-22 – 2017-12-29 (×14): 5 mg via ORAL
  Filled 2017-12-22 (×15): qty 1

## 2017-12-22 MED ORDER — HYDROMORPHONE HCL 1 MG/ML IJ SOLN
INTRAMUSCULAR | Status: AC
  Filled 2017-12-22: qty 0.5

## 2017-12-22 NOTE — Progress Notes (Signed)
ANTICOAGULATION CONSULT NOTE - [place] for heparin >> apixaban Indication: atrial fibrillation and history of DVT  Allergies  Allergen Reactions  . Ace Inhibitors Cough    Patient Measurements: Height: 5\' 5"  (165.1 cm) Weight: 173 lb 4.5 oz (78.6 kg) IBW/kg (Calculated) : 57 Heparin Dosing Weight: 73.5kg  Vital Signs: Temp: 97.5 F (36.4 C) (03/26 1343) Temp Source: Oral (03/26 1343) BP: 127/31 (03/26 1343) Pulse Rate: 70 (03/26 1343)  Labs: Recent Labs    12/19/17 1650  12/20/17 0640 12/21/17 1338 12/22/17 0240 12/22/17 1254  HGB  --   --  8.6*  --  8.1*  --   HCT  --   --  28.4*  --  26.2*  --   PLT  --   --  190  --  219  --   LABPROT 14.2  --  15.0  --   --   --   INR 1.11  --  1.19  --   --   --   HEPARINUNFRC  --    < > 0.67  --  0.95* >2.20*  CREATININE  --   --   --  4.80* 5.27*  --    < > = values in this interval not displayed.    Estimated Creatinine Clearance: 10 mL/min (A) (by C-G formula based on SCr of 5.27 mg/dL (H)).  Assessment: 72 yo F with history of AFib and DVT on warfarin PTA. Warfarin was originally continued while inpatient (last dose given 3/20), but was then held due to need for vascular procedure. Home dose of warfarin 4mg  daily. Patient did receive vitamin K 5mg  PO x1 on 3/21 in preparation for vascular procedure.  Coumadin remains on hold given suspicion of calciphylaxis vs warfarin induced skin necrosis. Pt s/p skin punch biopsy 3/25.  Awaiting results.  In the interim pt has been on heparin with elevated levels x 2.  Heparin is difficult at this time given IV access is located in R hand and lab draw is occurring in R elbow area.  Spoke with phlebotomy and no other lab draw sites given left arm restriction with dialysis access.  Discussed with Dr. Teryl Lucy complications of IV heparin.  Will transition patient to PO apixaban pending biopsy results.  Goal of Therapy:  Therapeutic Anticoagulation Monitor  platelets by anticoagulation protocol: Yes   Plan:  Discontinue heparin and associated labs. Start Apixaban 5mg  PO BID Follow for s/s bleeding  Follow up skin punch bx results  [place], Pharm.D., BCPS Clinical Pharmacist Pager: [phone] Clinical phone for 12/22/2017 from 8:30-4:00 is [phone]. After 4pm, please call Main [facility] [phone]) for assistance. 12/22/2017 3:13 PM

## 2017-12-22 NOTE — Progress Notes (Signed)
[facility] Progress Note   Subjective:  S/p leg ulcer biopsy.  Awaiting results.  Just received some Diluadid so a bit sleepy this AM.  Objective Vitals:   12/22/17 0319 12/22/17 0537 12/22/17 0630 12/22/17 0710  BP: (!) 170/42 (!) 163/47  (!) 158/55  Pulse:  70  64  Resp:  16  16  Temp:  98 F (36.7 C)  98 F (36.7 C)  TempSrc:  Oral  Oral  SpO2:  100%  100%  Weight:   78.6 kg (173 lb 4.5 oz)   Height:       Physical Exam General: NAD Heart: RRR; no murmur Lungs: CTAB Abdomen: nontender today Extremities: No LE edema. Mild R calf tenderness- 8 x 3 (approximately) cm area of necrosis with clearly demarcated edges. Dialysis Access: R TDC, LUE AVF which is not matured  Additional Objective Labs: Basic Metabolic Panel: Recent Labs  Lab 12/19/17 0905 12/21/17 1338 12/22/17 0240  NA 133* 131* 129*  K 3.7 3.4* 3.8  CL 95* 95* 91*  CO2 26 26 25   GLUCOSE 108* 136* 127*  BUN 46* 48* 70*  CREATININE 4.40* 4.80* 5.27*  CALCIUM 8.4* 8.4* 8.6*  PHOS 2.5 2.9 3.0   Liver Function Tests: Recent Labs  Lab 12/19/17 0905 12/21/17 1338 12/22/17 0240  ALBUMIN 2.1* 2.2* 2.2*   CBC: Recent Labs  Lab 12/16/17 0637 12/17/17 0840 12/18/17 0627 12/19/17 0905 12/20/17 0640 12/22/17 0240  WBC 8.5 8.3 6.8 9.4 8.1 9.1  NEUTROABS 6.4  --   --   --   --   --   HGB 8.6* 8.7* 8.7* 7.9* 8.6* 8.1*  HCT 28.7* 28.1* 28.6* 25.9* 28.4* 26.2*  MCV 94.4 94.3 94.1 92.8 93.4 90.3  PLT 191 220 191 201 190 219   Blood Culture    Component Value Date/Time   SDES BLOOD RIGHT HAND 12/14/2017 1631   SPECREQUEST  12/14/2017 1631    BOTTLES DRAWN AEROBIC AND ANAEROBIC Blood Culture adequate volume   CULT  12/14/2017 1631    NO GROWTH 5 DAYS Performed at [facility], [address] [place], [place] [address]    REPTSTATUS 12/19/2017 FINAL 12/14/2017 1631    Studies/Results: No results found. Medications: . sodium chloride    . ferric gluconate (FERRLECIT/NULECIT) IV  Stopped (12/17/17 1620)  . heparin 950 Units/hr (12/22/17 0320)  . sodium thiosulfate infusion for calciphylaxis     . atorvastatin  20 mg Oral Daily  . timolol  1 drop Left Eye Daily   And  . brimonidine  1 drop Left Eye Daily  . [START ON 12/24/2017] darbepoetin (ARANESP) injection - DIALYSIS  100 mcg Intravenous Q Thu-HD  . dicyclomine  10 mg Oral TID AC & HS  . febuxostat  40 mg Oral Daily  . feeding supplement (NEPRO CARB STEADY)  237 mL Oral Q24H  . feeding supplement (PRO-STAT SUGAR FREE 64)  30 mL Oral BID  . folic acid  1 mg Oral Daily  . HYDROmorphone      . hydroxychloroquine  300 mg Oral Daily  . insulin aspart  0-9 Units Subcutaneous TID WC  . ketorolac  1 drop Left Eye QID  . latanoprost  1 drop Left Eye QHS  . metoprolol tartrate  25 mg Oral Q T,Th,Sat-1800  . metoprolol tartrate  50 mg Oral 2 times per day on Sun Mon Wed Fri  . nutrition supplement (JUVEN)  1 packet Oral BID BM  . polyethylene glycol  17 g  Oral BID  . predniSONE  5 mg Oral Q breakfast  . sodium chloride flush  3 mL Intravenous Q12H    Dialysis Orders: TTS 4 hr Reids 400/800 3 K 2.5 Ca EDW 78kg right IJ TDC std Heparin, calcitriol 0.25 Mircera 50 last 3/5 --review of records show she has been pesenting below edw for at least several treatments with post HD wt 75.9 b3/16 and a net UF of 0.7 with her lowest BP being 127/44.  Assessment/Plan: 1. RLE calf wound: arteriogram neg for arterial or venous cause of skin ulcer.  Possible calciphylaxis or warfarin skin necrosis.  Have dc'd Ca++containing and vit D meds and started Na thio with OP HD- have continued here.  S/p deep tissue biopsy 12/21/2017. After biopsy done, please let pathology know to send the sample to derm pathology and that we are looking for calciphylaxis and/or warfarin skin necrosis. 2. ESRD: TTS HD . Has L 1st stage BVT placed in 2015.  Seen by VVS, holding on further surgery until leg lesion diagnosis is made.  Next HD today 3/26.   3. Hypertension/volume: BP ok, drops with HD, getting under EDW w/ HD, likely can be lowered slightly 4. Anemia: Hgb 8.7, Aranesp 100 given 3/20 off schedule- noted to have heme +stools 2/23 during ED visit - on chronic coumadind/c niferex.Giveweekly Fe - ^ ferritin likely due to chronic wound 5. Metabolic bone disease: Phos low.  Given poss calciphylaxis will dc phoslo/ vit D and calcitriol. 6. Nutrition: Alb very low (2.2), continue Nepro/Pro-stat/Juven pwdr. 7. Afib- on chronic coumadin/MTP 8. SLE: On Plaquenil. 9. DM: On insulin, per primary  [place] pgr [phone] 12/22/2017, 8:12 AM

## 2017-12-22 NOTE — Progress Notes (Signed)
ANTICOAGULATION CONSULT NOTE - Follow Up Consult  Pharmacy Consult for heparin Indication: Afib and h/o DVT  Labs: Recent Labs    12/19/17 0905 12/19/17 1650 12/19/17 2350 12/20/17 0640 12/21/17 1338 12/22/17 0240  HGB 7.9*  --   --  8.6*  --  8.1*  HCT 25.9*  --   --  28.4*  --  26.2*  PLT 201  --   --  190  --  219  LABPROT  --  14.2  --  15.0  --   --   INR  --  1.11  --  1.19  --   --   HEPARINUNFRC  --   --  0.42 0.67  --  0.95*  CREATININE 4.40*  --   --   --  4.80*  --     Assessment: 72yo female supratherapeutic on heparin after resumed post-biopsy; was previously therapeutic at this rate though was trending up and was at upper end of goal; RN notes no overt signs of bleeding.  Goal of Therapy:  Heparin level 0.3-0.7 units/ml   Plan:  Will decrease heparin gtt by 2 units/kg/hr to 950 units/hr and check level in 8 hours.    [name], PharmD, BCPS  12/22/2017,3:13 AM

## 2017-12-22 NOTE — Progress Notes (Signed)
Patient had a large BM and does not want enema given

## 2017-12-22 NOTE — Progress Notes (Signed)
4 Days Post-Op    CC:  Nonhealing RLE ulcer  Subjective: On HD and bed pan, fairly uncomfortable.  Bx site is OK   Objective: Vital signs in last 24 hours: Temp:  [98 F (36.7 C)-98.5 F (36.9 C)] 98 F (36.7 C) (03/26 0710) Pulse Rate:  [62-70] 65 (03/26 0915) Resp:  [15-20] 17 (03/26 0845) BP: (123-188)/(34-56) 123/36 (03/26 0915) SpO2:  [94 %-100 %] 100 % (03/26 0710) Weight:  [78.6 kg (173 lb 4.5 oz)] 78.6 kg (173 lb 4.5 oz) (03/26 0630) Last BM Date: 12/17/17  Intake/Output from previous day: 03/25 0701 - 03/26 0700 In: 131.4 [P.O.:120; I.V.:11.4] Out: -  Intake/Output this shift: No intake/output data recorded.  General appearance: alert, cooperative and no distress Skin: BX site is OK.  will clean and redress after HD  Lab Results:  Recent Labs    12/20/17 0640 12/22/17 0240  WBC 8.1 9.1  HGB 8.6* 8.1*  HCT 28.4* 26.2*  PLT 190 219    BMET Recent Labs    12/21/17 1338 12/22/17 0240  NA 131* 129*  K 3.4* 3.8  CL 95* 91*  CO2 26 25  GLUCOSE 136* 127*  BUN 48* 70*  CREATININE 4.80* 5.27*  CALCIUM 8.4* 8.6*   PT/INR Recent Labs    12/19/17 1650 12/20/17 0640  LABPROT 14.2 15.0  INR 1.11 1.19    Recent Labs  Lab 12/16/17 0637 12/17/17 0841 12/19/17 0905 12/21/17 1338 12/22/17 0240  ALBUMIN 2.2* 2.2* 2.1* 2.2* 2.2*     Lipase     Component Value Date/Time   LIPASE 20 11/02/2017 1536     Medications: . atorvastatin  20 mg Oral Daily  . timolol  1 drop Left Eye Daily   And  . brimonidine  1 drop Left Eye Daily  . [START ON 12/24/2017] darbepoetin (ARANESP) injection - DIALYSIS  100 mcg Intravenous Q Thu-HD  . dicyclomine  10 mg Oral TID AC & HS  . febuxostat  40 mg Oral Daily  . feeding supplement (NEPRO CARB STEADY)  237 mL Oral Q24H  . feeding supplement (PRO-STAT SUGAR FREE 64)  30 mL Oral BID  . folic acid  1 mg Oral Daily  . HYDROmorphone      . hydroxychloroquine  300 mg Oral Daily  . insulin aspart  0-9 Units  Subcutaneous TID WC  . ketorolac  1 drop Left Eye QID  . latanoprost  1 drop Left Eye QHS  . metoprolol tartrate  25 mg Oral Q T,Th,Sat-1800  . metoprolol tartrate  50 mg Oral 2 times per day on Sun Mon Wed Fri  . nutrition supplement (JUVEN)  1 packet Oral BID BM  . polyethylene glycol  17 g Oral BID  . predniSONE  5 mg Oral Q breakfast  . sodium chloride flush  3 mL Intravenous Q12H   . sodium chloride    . ferric gluconate (FERRLECIT/NULECIT) IV Stopped (12/17/17 1620)  . heparin 950 Units/hr (12/22/17 0320)  . sodium thiosulfate infusion for calciphylaxis     Anti-infectives (From admission, onward)   Start     Dose/Rate Route Frequency Ordered Stop   12/19/17 1132  vancomycin (VANCOCIN) 1-5 GM/200ML-% IVPB    Note to Pharmacy:  Herriott, Melisa   : cabinet override      12/19/17 1132 12/19/17 2344   12/16/17 1200  vancomycin (VANCOCIN) IVPB 1000 mg/200 mL premix  Status:  Discontinued     1,000 mg 200 mL/hr over 60 Minutes Intravenous Every  Dialysis 12/15/17 1909 12/16/17 1112   12/16/17 1200  vancomycin (VANCOCIN) IVPB 1000 mg/200 mL premix     1,000 mg 200 mL/hr over 60 Minutes Intravenous Every Wed (Hemodialysis) 12/16/17 1112 12/16/17 1245   12/15/17 1600  vancomycin (VANCOCIN) IVPB 1000 mg/200 mL premix  Status:  Discontinued     1,000 mg 200 mL/hr over 60 Minutes Intravenous Every T-Th-Sa (Hemodialysis) 12/15/17 1116 12/19/17 1551   12/15/17 1000  hydroxychloroquine (PLAQUENIL) tablet 300 mg    Note to Pharmacy:  Take 1 1/2 tablets daily     300 mg Oral Daily 12/14/17 2334     12/14/17 2030  piperacillin-tazobactam (ZOSYN) IVPB 3.375 g  Status:  Discontinued     3.375 g 12.5 mL/hr over 240 Minutes Intravenous Every 12 hours 12/14/17 2022 12/15/17 1054   12/14/17 1930  vancomycin (VANCOCIN) 1,500 mg in sodium chloride 0.9 % 500 mL IVPB     1,500 mg 250 mL/hr over 120 Minutes Intravenous  Once 12/14/17 1923 12/14/17 2255       Assessment/Plan ESRD Hypertension Anemia AF - chronic coumadin SLE- plaquenil DM- insulin dependent  Nonhealing RLE ulcer - rule out coumadin necrosis/ rule out calcipha Punch bx - 12/17/17  FEN:  Renal/carb mod ID: Plaquenil DVT: heparin drip Follow up:  Dr. Jonnie Finner    Plan:  Clean site with soap and water, BID.  Triple abx ointment and dry dressing.  Remove suture next week.  Path is pending.     LOS: 8 days    Haddon Fyfe 12/22/2017 [phone]

## 2017-12-22 NOTE — Progress Notes (Signed)
PROGRESS NOTE    [name]  AST:[id number] DOB: 02-May-1946 DOA: 12/14/2017 PCP: Sharilyn Sites, MD   Brief Narrative: [name] is a 72 y.o. female with a history of ESRD, COPD, atrial fibrillation, lupus, diabetes mellitus type 2, hypertension, DVT right upper extremity, presenting from her PCPs office secondary to worsening of chronic right diabetic foot ulceration with concerns of cellulitis of the right calf.  The patient states that she has had wounds in her bilateral lower extremities for several months.  She denies any recent injury.  The patient had been following up by the wound care center but stopped going in January 2019 due to a change of her health insurance. Since then, she has noticed worsening of the wound and has been having worsening pain for the past few days.   In the emergency department, the patient was noted to have WBC 12.7, but she was afebrile hemodynamically stable.  X-rays of the right foot showed possible tiny erosions at the tops of the second and third distal phalanges.  The patient was started on Vancomycin Zosyn admitted for further evaluation. September 03, 2017, she had a lower extremity arterial duplex evaluation showed a patent right superficial femoral artery with velocities suggestive of a 75-99% stenosis involving the distal segment. Patent left lower extremity arterial system without evidence of hemodynamically significant stenosis. 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 skin necrosis   Assessment & Plan:   Principal Problem:   Cellulitis in diabetic foot ([place]) Active Problems:   Atrial flutter (HCC)   Type 2 diabetes mellitus with diabetic neuropathy (HCC)   SLE (systemic lupus erythematosus) (HCC)   Essential hypertension, benign   COPD (chronic obstructive pulmonary disease) (HCC)   Anemia of chronic renal failure, stage 4 (severe) (HCC)   Mixed hyperlipidemia   ESRD (end stage renal disease) on dialysis [facility])   Atrial  fibrillation (HCC)   Glaucoma   Ischemic ulcer of toes on both feet (HCC)   Right leg wound Unlikely to be cellulitis. Differential now includes calciphylaxis vs warfarin skin necrosis. Coumadin held. Calcium/vitamin D containing products discontinued per nephrology -Nephrology recs: sodium thiosulfate -Biopsy path pending (obtained 3/25)  Atrial fibrillation CHA2DS2-VASc Score is 4. Rate controlled. Coumadin held secondary to vascular procedure. Now on hold secondary to possible warfarin skin necrosis -Heparin gtt per pharmacy -Continue metoprolol  ESRD -Nephrology for HD  Essential hypertension -Continue metoprolol  SLE -Continue Plaquenil and prednisone  Diabetes mellitus, type 2 Last hemoglobin A1C of 5.8%. Levemir held. -Continue SSI  COPD Stable.  Abdominal pain This has been worked up for the last few months without an etiology. Patient without a bowel movement in a while -Miralax  Constipation Patient with difficulty having a bowel movement today. -Continue miralax BID -Soap suds enema   DVT prophylaxis: Heparin gtt Code Status:   Code Status: Full Code Family Communication: None at bedside Disposition Plan: Discharge home when biopsy results available   Consultants:   Nephrology  Vascular surgery  General surgery  Procedures:   Arteriogram  HD (TTS)  Antimicrobials:  Vancomycin (3/18>>3/23)  Zosyn (3/18>>3/19)   Subjective: Constipation. Having difficulty with bowel movement.  Objective: Vitals:   12/22/17 0915 12/22/17 0945 12/22/17 1000 12/22/17 1030  BP: (!) 123/36 (!) 163/72 (!) 145/72 137/73  Pulse: 65 76 75 73  Resp:   (!) 25 (!) 25  Temp:      TempSrc:      SpO2:      Weight:  Height:        Intake/Output Summary (Last 24 hours) at 12/22/2017 1132 Last data filed at 12/21/2017 1500 Gross per 24 hour  Intake 131.37 ml  Output -  Net 131.37 ml   Filed Weights   12/19/17 1507 12/21/17 0556 12/22/17 0630    Weight: 78.6 kg (173 lb 4.8 oz) 75.7 kg (166 lb 14.2 oz) 78.6 kg (173 lb 4.5 oz)    Examination:  General exam: Appears calm and comfortable Respiratory system: Clear to auscultation. Respiratory effort normal. Cardiovascular system: S1 & S2 heard, RRR. No murmurs Gastrointestinal system: Abdomen is nondistended, soft and nontender. Normal bowel sounds heard. Central nervous system: Alert and oriented. No focal neurological deficits. Extremities: No edema. No calf tenderness Skin: Could not assess wound this morning. Last evaluated on 3/25 and has been stable. Psychiatry: Judgement and insight appear normal. Mood & affect appropriate.     Data Reviewed: I have personally reviewed following labs and imaging studies  CBC: Recent Labs  Lab 12/16/17 0637 12/17/17 0840 12/18/17 0627 12/19/17 0905 12/20/17 0640 12/22/17 0240  WBC 8.5 8.3 6.8 9.4 8.1 9.1  NEUTROABS 6.4  --   --   --   --   --   HGB 8.6* 8.7* 8.7* 7.9* 8.6* 8.1*  HCT 28.7* 28.1* 28.6* 25.9* 28.4* 26.2*  MCV 94.4 94.3 94.1 92.8 93.4 90.3  PLT 191 220 191 201 190 209   Basic Metabolic Panel: Recent Labs  Lab 12/16/17 0637 12/17/17 0841 12/18/17 0627 12/19/17 0905 12/21/17 1338 12/22/17 0240  NA 137 134* 134* 133* 131* 129*  K 3.9 3.5 3.6 3.7 3.4* 3.8  CL 102 97* 97* 95* 95* 91*  CO2 26 28 26 26 26 25   GLUCOSE 133* 114* 143* 108* 136* 127*  BUN 52* 30* 37* 46* 48* 70*  CREATININE 3.76* 2.75* 2.73* 4.40* 4.80* 5.27*  CALCIUM 9.1 8.8* 8.4* 8.4* 8.4* 8.6*  PHOS 2.1* 1.7*  --  2.5 2.9 3.0   GFR: Estimated Creatinine Clearance: 10 mL/min (A) (by C-G formula based on SCr of 5.27 mg/dL (H)). Liver Function Tests: Recent Labs  Lab 12/16/17 0637 12/17/17 0841 12/19/17 0905 12/21/17 1338 12/22/17 0240  ALBUMIN 2.2* 2.2* 2.1* 2.2* 2.2*   No results for input(s): LIPASE, AMYLASE in the last 168 hours. No results for input(s): AMMONIA in the last 168 hours. Coagulation Profile: Recent Labs  Lab  12/16/17 0637 12/17/17 0500 12/18/17 0627 12/19/17 1650 12/20/17 0640  INR 1.83 1.99 1.26 1.11 1.19   Cardiac Enzymes: No results for input(s): CKTOTAL, CKMB, CKMBINDEX, TROPONINI in the last 168 hours. BNP (last 3 results) No results for input(s): PROBNP in the last 8760 hours. HbA1C: No results for input(s): HGBA1C in the last 72 hours. CBG: Recent Labs  Lab 12/20/17 2138 12/21/17 0749 12/21/17 1400 12/21/17 1629 12/21/17 2233  GLUCAP 134* 90 102* 148* 124*   Lipid Profile: No results for input(s): CHOL, HDL, LDLCALC, TRIG, CHOLHDL, LDLDIRECT in the last 72 hours. Thyroid Function Tests: No results for input(s): TSH, T4TOTAL, FREET4, T3FREE, THYROIDAB in the last 72 hours. Anemia Panel: No results for input(s): VITAMINB12, FOLATE, FERRITIN, TIBC, IRON, RETICCTPCT in the last 72 hours. Sepsis Labs: No results for input(s): PROCALCITON, LATICACIDVEN in the last 168 hours.  Recent Results (from the past 240 hour(s))  Culture, blood (Routine x 2)     Status: None   Collection Time: 12/14/17  4:30 PM  Result Value Ref Range Status   Specimen Description RIGHT ANTECUBITAL  Final  Special Requests   Final    BOTTLES DRAWN AEROBIC AND ANAEROBIC Blood Culture adequate volume   Culture   Final    NO GROWTH 5 DAYS Performed at [facility], [address], [place], [place] [address]    Report Status 12/19/2017 FINAL  Final  Culture, blood (Routine x 2)     Status: None   Collection Time: 12/14/17  4:31 PM  Result Value Ref Range Status   Specimen Description BLOOD RIGHT HAND  Final   Special Requests   Final    BOTTLES DRAWN AEROBIC AND ANAEROBIC Blood Culture adequate volume   Culture   Final    NO GROWTH 5 DAYS Performed at [facility], [address] [place], [place] [address]    Report Status 12/19/2017 FINAL  Final  MRSA PCR Screening     Status: None   Collection Time: 12/15/17  9:15 AM  Result Value Ref Range Status   MRSA by PCR NEGATIVE NEGATIVE Final     Comment:        The GeneXpert MRSA Assay (FDA approved for NASAL specimens only), is one component of a comprehensive MRSA colonization surveillance program. It is not intended to diagnose MRSA infection nor to guide or monitor treatment for MRSA infections. Performed at [facility], [address] [place], [place] [address]   Surgical pcr screen     Status: None   Collection Time: 12/17/17  7:49 PM  Result Value Ref Range Status   MRSA, PCR NEGATIVE NEGATIVE Final   Staphylococcus aureus NEGATIVE NEGATIVE Final    Comment: (NOTE) The Xpert SA Assay (FDA approved for NASAL specimens in patients 19 years of age and older), is one component of a comprehensive surveillance program. It is not intended to diagnose infection nor to guide or monitor treatment. Performed at [facility], [place] [address] [place],  [address]          Radiology Studies: No results found.      Scheduled Meds: . atorvastatin  20 mg Oral Daily  . timolol  1 drop Left Eye Daily   And  . brimonidine  1 drop Left Eye Daily  . [START ON 12/24/2017] darbepoetin (ARANESP) injection - DIALYSIS  100 mcg Intravenous Q Thu-HD  . dicyclomine  10 mg Oral TID AC & HS  . febuxostat  40 mg Oral Daily  . feeding supplement (NEPRO CARB STEADY)  237 mL Oral Q24H  . feeding supplement (PRO-STAT SUGAR FREE 64)  30 mL Oral BID  . folic acid  1 mg Oral Daily  . HYDROmorphone      . hydroxychloroquine  300 mg Oral Daily  . insulin aspart  0-9 Units Subcutaneous TID WC  . ketorolac  1 drop Left Eye QID  . latanoprost  1 drop Left Eye QHS  . metoprolol tartrate  25 mg Oral Q T,Th,Sat-1800  . metoprolol tartrate  50 mg Oral 2 times per day on Sun Mon Wed Fri  . nutrition supplement (JUVEN)  1 packet Oral BID BM  . polyethylene glycol  17 g Oral BID  . predniSONE  5 mg Oral Q breakfast  . sodium chloride flush  3 mL Intravenous Q12H   Continuous Infusions: . sodium chloride    . ferric gluconate  (FERRLECIT/NULECIT) IV Stopped (12/17/17 1620)  . heparin 950 Units/hr (12/22/17 0320)  . sodium thiosulfate infusion for calciphylaxis       LOS: 8 days     Cordelia Poche, MD  [facility] 12/22/2017, 11:32 AM Pager: [phone]  If 7PM-7AM, please contact night-coverage [email or web address] Password [facility] 12/22/2017, 11:32 AM

## 2017-12-22 NOTE — Procedures (Addendum)
Patient seen and examined on Hemodialysis. QB 400 mL/ min via R IJ TDC, UF goal 2.5L  Tolerating rx well  Treatment adjusted as needed  Madelon Lips MD [facility] pgr [phone] 8:10 AM

## 2017-12-22 NOTE — Progress Notes (Signed)
Nutrition Follow-up  DOCUMENTATION CODES:   Not applicable  INTERVENTION:  Continue:  1 packet Juven BID, each packet provides 80 calories, 8 grams of carbohydrate, and 14 grams of amino acids; supplement contains CaHMB, glutamine, and arginine, to promote wound healing  Pro-stat 22m BID, each supplement provides 100 calories and 15 grams of protein  Nepro Shake po BID, each supplement provides 425 kcal and 19 grams protein  NUTRITION DIAGNOSIS:   Increased nutrient needs(Chronic diaylsis ) related to wound healing, chronic illness(Chronic diaylsis) as evidenced by estimated needs(protein and energy increased). -ongoing  GOAL:   Patient will meet greater than or equal to 90% of their needs -met  MONITOR:   Supplement acceptance, PO intake, Labs, Weight trends, Skin  ASSESSMENT:   72yo female with ESRD with HD treatments three times weekly. Hx also of lupus, CHF, COPD, DM-2 and HTN. [facility] At the nursing center she had been eating 25-75%   Patient had HD this AM, RD arrived shortly after she had returned. Breakfast was at bedside. She has been drinking NePro and Juven, taking Pro-stat. Awaiting pathology of RLE ulcer. PO 75-100%  Labs reviewed:  Na 129, BUN/Creatinine 70/5.27 Medications reviewed and include:  Insulin, Miralax, Prednisone  Diet Order:  Diet renal/carb modified with fluid restriction Diet-HS Snack? Nothing; Fluid restriction: 1200 mL Fluid; Room service appropriate? Yes; Fluid consistency: Thin  EDUCATION NEEDS:   No education needs have been identified at this time  Skin:  Skin Assessment: Skin Integrity Issues: Skin Integrity Issues:: Other (Comment) Other: R third toe non-pressure wound; R fifth toe non-pressure wound; R Arterial ulceration to Leg  Last BM:  12/22/2017  Height:   Ht Readings from Last 1 Encounters:  12/19/17 '5\' 5"'  (1.651 m)    Weight:   Wt Readings from  Last 1 Encounters:  12/22/17 173 lb 4.5 oz (78.6 kg)    Ideal Body Weight:  56.8 kg  BMI:  Body mass index is 28.84 kg/m.  Estimated Nutritional Needs:   Kcal:  1800-2000  Protein:  85-95g Pro   Fluid:   1.2 L fluid  WSatira Anis Aloria Looper, MS, RD LDN Inpatient Clinical Dietitian Pager [phone]

## 2017-12-23 DIAGNOSIS — L03119 Cellulitis of unspecified part of limb: Secondary | ICD-10-CM

## 2017-12-23 DIAGNOSIS — E11628 Type 2 diabetes mellitus with other skin complications: Secondary | ICD-10-CM

## 2017-12-23 LAB — RENAL FUNCTION PANEL
ALBUMIN: 2.2 g/dL — AB (ref 3.5–5.0)
Anion gap: 11 (ref 5–15)
BUN: 26 mg/dL — ABNORMAL HIGH (ref 6–20)
CALCIUM: 8.4 mg/dL — AB (ref 8.9–10.3)
CO2: 28 mmol/L (ref 22–32)
Chloride: 96 mmol/L — ABNORMAL LOW (ref 101–111)
Creatinine, Ser: 3.13 mg/dL — ABNORMAL HIGH (ref 0.44–1.00)
GFR calc non Af Amer: 14 mL/min — ABNORMAL LOW (ref >60)
GFR, EST AFRICAN AMERICAN: 16 mL/min — AB (ref >60)
GLUCOSE: 103 mg/dL — AB (ref 65–99)
PHOSPHORUS: 2.3 mg/dL — AB (ref 2.5–4.6)
Potassium: 3.6 mmol/L (ref 3.5–5.1)
SODIUM: 135 mmol/L (ref 135–145)

## 2017-12-23 LAB — GLUCOSE, CAPILLARY
GLUCOSE-CAPILLARY: 81 mg/dL (ref 65–99)
GLUCOSE-CAPILLARY: 142 mg/dL — AB (ref 65–99)
Glucose-Capillary: 84 mg/dL (ref 65–99)
Glucose-Capillary: 166 mg/dL — ABNORMAL HIGH (ref 65–99)

## 2017-12-23 LAB — TSH: TSH: 2.111 u[IU]/mL (ref 0.350–4.500)

## 2017-12-23 LAB — VITAMIN B12: Vitamin B-12: 3030 pg/mL — ABNORMAL HIGH (ref 180–914)

## 2017-12-23 LAB — AMMONIA: AMMONIA: 18 umol/L (ref 9–35)

## 2017-12-23 MED ORDER — DARBEPOETIN ALFA 200 MCG/0.4ML IJ SOSY
200.0000 ug | PREFILLED_SYRINGE | INTRAMUSCULAR | Status: DC
  Administered 2017-12-24: 200 ug via INTRAVENOUS

## 2017-12-23 MED ORDER — HALOPERIDOL 2 MG PO TABS
2.0000 mg | ORAL_TABLET | Freq: Every day | ORAL | Status: DC | PRN
Start: 2017-12-23 — End: 2017-12-29
  Administered 2017-12-25: 2 mg via ORAL
  Filled 2017-12-23 (×3): qty 1

## 2017-12-23 NOTE — Progress Notes (Signed)
PROGRESS NOTE    [name]  TMA:[id number] DOB: October 26, 1945 DOA: 12/14/2017 PCP: Sharilyn Sites, MD      Brief Narrative:  This is Critzer is a 72 year old female with ESRD, COPD, A. fib, lupus, diabetes, HTN, DVT who presents with right leg ulcer.  She was started on empiric antibiotics, without improvement.  Vascular surgery was consulted, but duplex ultrasounds of the leg showed no evidence of stenosis.    Biopsy was obtained.   Assessment & Plan:  Right leg ulcer Possible calciphylaxis versus warfarin skin necrosis.  Biopsy today shows nonspecific findings, no help in diagnosis.  Other considerations could be pyoderma gangrenosum or lupus. -Continue sodium thiosulfate  Hallucinations Starting today, the patient has been having hallucinations of "little babies", "spiders that are growing and ".  Per husband she has no record of memory problems.  No previous dementia. -Hold hydromorphone and oxycodone, in case these are causing delirium -We will discuss with nephrology if sodium thiosulfate can cause hallucinations  Atrial fibrillation, paroxysmal Chads 2 Vasc 4.  Rate controlled.  Anticoagulated previously with warfarin. - Continue new apixaban -Continue metoprolol  ESRD -Consulted nephrology for HD, appreciate cares  Lupus -Continue prednisone and hydroxychloroquine  Diabetes Well controlled -Hold Levemir -SSI as needed  COPD No evidence of disease  Chronic constipation -Continue MiraLAX twice daily  Other medications -Continue statin -Continue eye drops -Continue Uloric -Continue dicyclomine         DVT prophylaxis: N/A on apixaban Code Status: FULL Family Communication: Husband at bedside MDM and disposition Plan: The below labs and imaging reports were reviewed.  The patient's status is clinically worsening with new delirium.  If no improvement with holding oxycodone, will obtain MRI brain tomorrow.    PT eval for disposition      Consultants:   Nephrology      Subjective: Reports new hallucinations.  Oriented to [facility] hospital, but slow to respond, somewhat disoriented.  States it is "1943".  No fever, no cough.  BM today.  Does not make urine.  Objective: Vitals:   12/23/17 0655 12/23/17 1145 12/23/17 1320 12/23/17 1501  BP: (!) 134/45 (!) 161/45 (!) 159/61   Pulse: 66 81 70   Resp:   20   Temp:   98.5 F (36.9 C)   TempSrc:   Oral   SpO2:  100% 100%   Weight:    79.9 kg (176 lb 2.4 oz)  Height:    5\' 5"  (1.651 m)    Intake/Output Summary (Last 24 hours) at 12/23/2017 1718 Last data filed at 12/23/2017 1300 Gross per 24 hour  Intake 320 ml  Output -  Net 320 ml   Filed Weights   12/21/17 0556 12/22/17 0630 12/23/17 1501  Weight: 75.7 kg (166 lb 14.2 oz) 78.6 kg (173 lb 4.5 oz) 79.9 kg (176 lb 2.4 oz)    Examination: General appearance: Elderly adult female, alert and in no acute distress.   HEENT: Anicteric, conjunctiva pink, lids and lashes normal. No nasal deformity, discharge, epistaxis.  Lips dry.  OP tacky dry.   Skin: Warm and dry.  The right leg has a palm sized dry ulcer with punched out edges, eschar in base.  No surroudndnig rendess or erythema or swelling or pain.  It is only mildly painful. Cardiac: RRR, nl S1-S2, no murmurs appreciated.  Capillary refill is brisk.  JVP normal.  No LE edema.  Radial pulses 2+ and symmetric. Respiratory: Normal respiratory rate and rhythm.  CTAB without rales or  wheezes. Abdomen: Abdomen soft.  No TTP. No ascites, distension, hepatosplenomegaly.   MSK: No deformities or effusions. Neuro: Awake and responding to questions but slow responses.  EOMI, moves all extremities. Speech fluent.    Psych: Sensorium intact and responding to questions, attention diminsihed. Affect flat.Hallucination babies.      Data Reviewed: I have personally reviewed following labs and imaging studies:  CBC: Recent Labs  Lab 12/17/17 0840 12/18/17 0627  12/19/17 0905 12/20/17 0640 12/22/17 0240  WBC 8.3 6.8 9.4 8.1 9.1  HGB 8.7* 8.7* 7.9* 8.6* 8.1*  HCT 28.1* 28.6* 25.9* 28.4* 26.2*  MCV 94.3 94.1 92.8 93.4 90.3  PLT 220 191 201 190 258   Basic Metabolic Panel: Recent Labs  Lab 12/17/17 0841 12/18/17 0627 12/19/17 0905 12/21/17 1338 12/22/17 0240 12/23/17 0556  NA 134* 134* 133* 131* 129* 135  K 3.5 3.6 3.7 3.4* 3.8 3.6  CL 97* 97* 95* 95* 91* 96*  CO2 28 26 26 26 25 28   GLUCOSE 114* 143* 108* 136* 127* 103*  BUN 30* 37* 46* 48* 70* 26*  CREATININE 2.75* 2.73* 4.40* 4.80* 5.27* 3.13*  CALCIUM 8.8* 8.4* 8.4* 8.4* 8.6* 8.4*  PHOS 1.7*  --  2.5 2.9 3.0 2.3*   GFR: Estimated Creatinine Clearance: 17 mL/min (A) (by C-G formula based on SCr of 3.13 mg/dL (H)). Liver Function Tests: Recent Labs  Lab 12/17/17 0841 12/19/17 0905 12/21/17 1338 12/22/17 0240 12/23/17 0556  ALBUMIN 2.2* 2.1* 2.2* 2.2* 2.2*   No results for input(s): LIPASE, AMYLASE in the last 168 hours. Recent Labs  Lab 12/23/17 1527  AMMONIA 18   Coagulation Profile: Recent Labs  Lab 12/17/17 0500 12/18/17 0627 12/19/17 1650 12/20/17 0640  INR 1.99 1.26 1.11 1.19   Cardiac Enzymes: No results for input(s): CKTOTAL, CKMB, CKMBINDEX, TROPONINI in the last 168 hours. BNP (last 3 results) No results for input(s): PROBNP in the last 8760 hours. HbA1C: No results for input(s): HGBA1C in the last 72 hours. CBG: Recent Labs  Lab 12/22/17 1655 12/22/17 2128 12/23/17 0809 12/23/17 1201 12/23/17 1653  GLUCAP 120* 151* 84 81 166*   Lipid Profile: No results for input(s): CHOL, HDL, LDLCALC, TRIG, CHOLHDL, LDLDIRECT in the last 72 hours. Thyroid Function Tests: Recent Labs    12/23/17 1527  TSH 2.111   Anemia Panel: No results for input(s): VITAMINB12, FOLATE, FERRITIN, TIBC, IRON, RETICCTPCT in the last 72 hours. Urine analysis:    Component Value Date/Time   COLORURINE AMBER (A) 11/19/2017 0300   APPEARANCEUR HAZY (A) 11/19/2017 0300    LABSPEC 1.021 11/19/2017 0300   PHURINE 5.0 11/19/2017 0300   GLUCOSEU NEGATIVE 11/19/2017 0300   HGBUR NEGATIVE 11/19/2017 0300   BILIRUBINUR NEGATIVE 11/19/2017 0300   KETONESUR NEGATIVE 11/19/2017 0300   PROTEINUR NEGATIVE 11/19/2017 0300   UROBILINOGEN 1.0 03/17/2014 1830   NITRITE NEGATIVE 11/19/2017 0300   LEUKOCYTESUR MODERATE (A) 11/19/2017 0300   Sepsis Labs: @LABRCNTIP (procalcitonin:4,lacticacidven:4)  ) Recent Results (from the past 240 hour(s))  Culture, blood (Routine x 2)     Status: None   Collection Time: 12/14/17  4:30 PM  Result Value Ref Range Status   Specimen Description RIGHT ANTECUBITAL  Final   Special Requests   Final    BOTTLES DRAWN AEROBIC AND ANAEROBIC Blood Culture adequate volume   Culture   Final    NO GROWTH 5 DAYS Performed at [facility], [address], [place], [place] [address]    Report Status 12/19/2017 FINAL  Final  Culture,  blood (Routine x 2)     Status: None   Collection Time: 12/14/17  4:31 PM  Result Value Ref Range Status   Specimen Description BLOOD RIGHT HAND  Final   Special Requests   Final    BOTTLES DRAWN AEROBIC AND ANAEROBIC Blood Culture adequate volume   Culture   Final    NO GROWTH 5 DAYS Performed at [facility], [address] [place], [place] [address]    Report Status 12/19/2017 FINAL  Final  MRSA PCR Screening     Status: None   Collection Time: 12/15/17  9:15 AM  Result Value Ref Range Status   MRSA by PCR NEGATIVE NEGATIVE Final    Comment:        The GeneXpert MRSA Assay (FDA approved for NASAL specimens only), is one component of a comprehensive MRSA colonization surveillance program. It is not intended to diagnose MRSA infection nor to guide or monitor treatment for MRSA infections. Performed at [facility], [address] [place], [place] [address]   Surgical pcr screen     Status: None   Collection Time: 12/17/17  7:49 PM  Result Value Ref Range Status   MRSA, PCR NEGATIVE NEGATIVE  Final   Staphylococcus aureus NEGATIVE NEGATIVE Final    Comment: (NOTE) The Xpert SA Assay (FDA approved for NASAL specimens in patients 37 years of age and older), is one component of a comprehensive surveillance program. It is not intended to diagnose infection nor to guide or monitor treatment. Performed at [facility], [place] [address] [place], [place] [address]          Radiology Studies: No results found.      Scheduled Meds: . apixaban  5 mg Oral BID  . atorvastatin  20 mg Oral Daily  . timolol  1 drop Left Eye Daily   And  . brimonidine  1 drop Left Eye Daily  . [START ON 12/24/2017] darbepoetin (ARANESP) injection - DIALYSIS  200 mcg Intravenous Q Thu-HD  . dicyclomine  10 mg Oral TID AC & HS  . febuxostat  40 mg Oral Daily  . feeding supplement (NEPRO CARB STEADY)  237 mL Oral Q24H  . feeding supplement (PRO-STAT SUGAR FREE 64)  30 mL Oral BID  . folic acid  1 mg Oral Daily  . hydroxychloroquine  300 mg Oral Daily  . insulin aspart  0-9 Units Subcutaneous TID WC  . ketorolac  1 drop Left Eye QID  . latanoprost  1 drop Left Eye QHS  . metoprolol tartrate  25 mg Oral Q T,Th,Sat-1800  . metoprolol tartrate  50 mg Oral 2 times per day on Sun Mon Wed Fri  . nutrition supplement (JUVEN)  1 packet Oral BID BM  . polyethylene glycol  17 g Oral BID  . predniSONE  5 mg Oral Q breakfast  . sodium chloride flush  3 mL Intravenous Q12H   Continuous Infusions: . sodium chloride    . ferric gluconate (FERRLECIT/NULECIT) IV Stopped (12/17/17 1620)  . sodium thiosulfate infusion for calciphylaxis       LOS: 9 days    Time spent: 40 minutes    Edwin Dada, MD [facility] 12/23/2017, 5:18 PM     Pager [phone] --- please page though AMION:  [email or web address] Password [email or web address] If 7PM-7AM, please contact night-coverage

## 2017-12-23 NOTE — Progress Notes (Signed)
Called to patient room, husband wanting to know about discharge, states "she can not be discharged, she can't walk, can't cook, is constipated and is seeing spiders" and "saw dogs yesterday". When asked patient reports bowel movement a few minutes ago, confirmed with another nurse who helped her to bedside commode, patient did not report any hallucinations to nurse and no report from previous nurse. She did however request not to take oxycodone for her pain this morning stating "it makes me feel bad", tylenol was given. Husband reports that she was 245 lbs fall of last year, she is now 176 lbs.  Provider text paged to make aware of concerns and that husband would like update.

## 2017-12-23 NOTE — Progress Notes (Signed)
[facility] Progress Note   Subjective:  Seen in room, sitting up and eating breakfast. Says pain controlled at this time. Path from skin biopsy 3/25 still pending. Started on Eliquis BID now (off heparin).  Objective Vitals:   12/22/17 1400 12/22/17 2127 12/23/17 0558 12/23/17 0655  BP:  (!) 130/40 (!) 78/60 (!) 134/45  Pulse:  71 71 66  Resp:  19 18   Temp:  99.3 F (37.4 C) 98.4 F (36.9 C)   TempSrc:  Oral Oral   SpO2: 100% 100% 99%   Weight:      Height:       Physical Exam General: Frail female, NAD Heart: RRR; no murmur Lungs: CTAB Extremities: No LE edema, ~8x3 cm posterior calf necrotic eschar with clearly demarcated edges. Blood blister to R 3rd toe Dialysis Access: R TDC, LUE AVF (maturing)   Additional Objective Labs: Basic Metabolic Panel: Recent Labs  Lab 12/21/17 1338 12/22/17 0240 12/23/17 0556  NA 131* 129* 135  K 3.4* 3.8 3.6  CL 95* 91* 96*  CO2 26 25 28   GLUCOSE 136* 127* 103*  BUN 48* 70* 26*  CREATININE 4.80* 5.27* 3.13*  CALCIUM 8.4* 8.6* 8.4*  PHOS 2.9 3.0 2.3*   Liver Function Tests: Recent Labs  Lab 12/21/17 1338 12/22/17 0240 12/23/17 0556  ALBUMIN 2.2* 2.2* 2.2*   CBC: Recent Labs  Lab 12/17/17 0840 12/18/17 0627 12/19/17 0905 12/20/17 0640 12/22/17 0240  WBC 8.3 6.8 9.4 8.1 9.1  HGB 8.7* 8.7* 7.9* 8.6* 8.1*  HCT 28.1* 28.6* 25.9* 28.4* 26.2*  MCV 94.3 94.1 92.8 93.4 90.3  PLT 220 191 201 190 219   Medications: . sodium chloride    . ferric gluconate (FERRLECIT/NULECIT) IV Stopped (12/17/17 1620)  . sodium thiosulfate infusion for calciphylaxis     . apixaban  5 mg Oral BID  . atorvastatin  20 mg Oral Daily  . timolol  1 drop Left Eye Daily   And  . brimonidine  1 drop Left Eye Daily  . [START ON 12/24/2017] darbepoetin (ARANESP) injection - DIALYSIS  100 mcg Intravenous Q Thu-HD  . dicyclomine  10 mg Oral TID AC & HS  . febuxostat  40 mg Oral Daily  . feeding supplement (NEPRO CARB STEADY)  237  mL Oral Q24H  . feeding supplement (PRO-STAT SUGAR FREE 64)  30 mL Oral BID  . folic acid  1 mg Oral Daily  . hydroxychloroquine  300 mg Oral Daily  . insulin aspart  0-9 Units Subcutaneous TID WC  . ketorolac  1 drop Left Eye QID  . latanoprost  1 drop Left Eye QHS  . metoprolol tartrate  25 mg Oral Q T,Th,Sat-1800  . metoprolol tartrate  50 mg Oral 2 times per day on Sun Mon Wed Fri  . nutrition supplement (JUVEN)  1 packet Oral BID BM  . polyethylene glycol  17 g Oral BID  . predniSONE  5 mg Oral Q breakfast  . sodium chloride flush  3 mL Intravenous Q12H    Dialysis Orders: TTS 4 hr Reids 400/800 3 K 2.5 Ca EDW 78kg right IJ TDC std Heparin, calcitriol 0.25 Mircera 50 last 3/5  Assessment/Plan: 1. RLE calf wound: arteriogram neg for arterial or venous cause of skin ulcer.  Possible calciphylaxis or warfarin skin necrosis.  Have dc'd Ca++containing and vit D meds and started Na thio with HD.  S/p deep tissue biopsy 12/21/2017. After biopsy done, please let pathology know to send the sample  to derm pathology and that we are looking for calciphylaxis and/or warfarin skin necrosis. 2. ESRD: TTS HD. Has L 1st stage BVT placed in 2015.  Seen by VVS, holding on further surgery until leg lesion diagnosis is made.  Next HD 3/28  3. Hypertension/volume: BP ok, drops with HD, getting under EDW w/ HD, likely can be lowered slightly 4. Anemia: Hgb 8.1, Aranesp 100 given3/20 off schedule, ^ dose to 245mcg weekly starting 3/28. +heme stools 2/23 during ED visit, no issues recently. 5. Metabolic bone disease: Phos low.  Given poss calciphylaxis -> d/c'd phoslo/ vit D and calcitriol. 6. Nutrition: Alb very low (2.2), continue Nepro/Pro-stat/Juven pwdr. 7. Afib- on chronic coumadin/MTP 8. SLE: On Plaquenil. 9. DM: On insulin, per primary   Dana Penton, PA-C 12/23/2017, 9:48 AM  [facility] Pager: [phone]

## 2017-12-24 DIAGNOSIS — R410 Disorientation, unspecified: Secondary | ICD-10-CM

## 2017-12-24 DIAGNOSIS — E1140 Type 2 diabetes mellitus with diabetic neuropathy, unspecified: Secondary | ICD-10-CM

## 2017-12-24 DIAGNOSIS — N184 Chronic kidney disease, stage 4 (severe): Secondary | ICD-10-CM

## 2017-12-24 DIAGNOSIS — D631 Anemia in chronic kidney disease: Secondary | ICD-10-CM

## 2017-12-24 DIAGNOSIS — Z992 Dependence on renal dialysis: Secondary | ICD-10-CM

## 2017-12-24 DIAGNOSIS — L97211 Non-pressure chronic ulcer of right calf limited to breakdown of skin: Secondary | ICD-10-CM

## 2017-12-24 DIAGNOSIS — M3214 Glomerular disease in systemic lupus erythematosus: Secondary | ICD-10-CM

## 2017-12-24 DIAGNOSIS — I4891 Unspecified atrial fibrillation: Secondary | ICD-10-CM

## 2017-12-24 DIAGNOSIS — N186 End stage renal disease: Secondary | ICD-10-CM

## 2017-12-24 DIAGNOSIS — Z794 Long term (current) use of insulin: Secondary | ICD-10-CM

## 2017-12-24 DIAGNOSIS — I10 Essential (primary) hypertension: Secondary | ICD-10-CM

## 2017-12-24 DIAGNOSIS — J438 Other emphysema: Secondary | ICD-10-CM

## 2017-12-24 LAB — CBC
HCT: 24.3 % — ABNORMAL LOW (ref 36.0–46.0)
HEMOGLOBIN: 7.2 g/dL — AB (ref 12.0–15.0)
MCH: 27.6 pg (ref 26.0–34.0)
MCHC: 29.6 g/dL — ABNORMAL LOW (ref 30.0–36.0)
MCV: 93.1 fL (ref 78.0–100.0)
Platelets: 235 10*3/uL (ref 150–400)
RBC: 2.61 MIL/uL — AB (ref 3.87–5.11)
RDW: 16.4 % — ABNORMAL HIGH (ref 11.5–15.5)
WBC: 8.5 10*3/uL (ref 4.0–10.5)

## 2017-12-24 LAB — RENAL FUNCTION PANEL
ALBUMIN: 2.2 g/dL — AB (ref 3.5–5.0)
ANION GAP: 10 (ref 5–15)
BUN: 59 mg/dL — ABNORMAL HIGH (ref 6–20)
CHLORIDE: 98 mmol/L — AB (ref 101–111)
CO2: 27 mmol/L (ref 22–32)
Calcium: 8.7 mg/dL — ABNORMAL LOW (ref 8.9–10.3)
Creatinine, Ser: 4.81 mg/dL — ABNORMAL HIGH (ref 0.44–1.00)
GFR calc Af Amer: 10 mL/min — ABNORMAL LOW (ref >60)
GFR, EST NON AFRICAN AMERICAN: 8 mL/min — AB (ref >60)
Glucose, Bld: 100 mg/dL — ABNORMAL HIGH (ref 65–99)
PHOSPHORUS: 2.8 mg/dL (ref 2.5–4.6)
POTASSIUM: 3.9 mmol/L (ref 3.5–5.1)
Sodium: 135 mmol/L (ref 135–145)

## 2017-12-24 LAB — GLUCOSE, CAPILLARY
GLUCOSE-CAPILLARY: 94 mg/dL (ref 65–99)
GLUCOSE-CAPILLARY: 35 mg/dL — AB (ref 65–99)
GLUCOSE-CAPILLARY: 36 mg/dL — AB (ref 65–99)
GLUCOSE-CAPILLARY: 108 mg/dL — AB (ref 65–99)
GLUCOSE-CAPILLARY: 186 mg/dL — AB (ref 65–99)
Glucose-Capillary: 142 mg/dL — ABNORMAL HIGH (ref 65–99)
Glucose-Capillary: 103 mg/dL — ABNORMAL HIGH (ref 65–99)
Glucose-Capillary: 210 mg/dL — ABNORMAL HIGH (ref 65–99)

## 2017-12-24 LAB — FOLATE RBC
Folate, Hemolysate: >620 ng/mL
Folate, RBC: >2490 ng/mL (ref >498)
Hematocrit: 24.9 % — ABNORMAL LOW (ref 34.0–46.6)

## 2017-12-24 MED ORDER — DEXTROSE 50 % IV SOLN
12.5000 g | Freq: Once | INTRAVENOUS | Status: AC
  Administered 2017-12-24: 12.5 g via INTRAVENOUS

## 2017-12-24 MED ORDER — DEXTROSE 50 % IV SOLN
INTRAVENOUS | Status: AC
  Filled 2017-12-24: qty 50

## 2017-12-24 MED ORDER — LIDOCAINE HCL 1 % IJ SOLN
10.0000 mL | INTRAMUSCULAR | Status: DC | PRN
  Filled 2017-12-24: qty 10

## 2017-12-24 MED ORDER — DARBEPOETIN ALFA 200 MCG/0.4ML IJ SOSY
PREFILLED_SYRINGE | INTRAMUSCULAR | Status: AC
  Administered 2017-12-24: 200 ug via INTRAVENOUS
  Filled 2017-12-24: qty 0.4

## 2017-12-24 MED ORDER — DEXTROSE 50 % IV SOLN
INTRAVENOUS | Status: AC
  Administered 2017-12-24: 50 mL
  Filled 2017-12-24: qty 50

## 2017-12-24 NOTE — Progress Notes (Signed)
PROGRESS NOTE    [name]  OEV:[id number] DOB: 1946/01/20 DOA: 12/14/2017 PCP: Sharilyn Sites, MD      Brief Narrative:  This is Gradel is a 72 year old female with ESRD, COPD, A. fib, lupus, diabetes, HTN, DVT who presents with right leg ulcer.  She was started on empiric antibiotics, without improvement.  Vascular surgery was consulted, but duplex ultrasounds of the leg showed no evidence of stenosis.    Biopsy was obtained 3/25, not definitive.   Assessment & Plan:  Right leg ulcer Possible calciphylaxis versus warfarin skin necrosis.  Biopsy today shows nonspecific findings, no help in diagnosis.  Other considerations could be pyoderma gangrenosum or lupus. -Hold Na thiosulfate for now (see #2)  Hallucinations Pt with hallucinations yesterday, very altered, delirious.  Probably was from oxycodone, but chronicity makes me wonder about thiosulfate (package insert lists agitation/hallucinations as side effects, although my understanding was that this was a fairly inert treatment, few actually observed side effects).  Regardless, until her delirium is more clearly resolved, I recommend stopping it.  No previous dementia. -Hold opiates -Hold Na thiosulfate for now -May have haldol PO prn for agitation -If still delirious this afternoon, will obtain MRI.    Atrial fibrillation, paroxysmal Chads 2 Vasc 4.  Rate controlled.  Anticoagulated previously with warfarin. -No change to apix, metop   ESRD -Consulted nephrology for HD, appreciate cares  Lupus -No change to pred, Plaqu   Diabetes Well controlled -Hold Lev -SSI    COPD No evidence of disease  Chronic constipation -Continue MiraLAX twice daily  Other medications -Continue statin -Continue eye drops -Continue Uloric -Continue dicyclomine   RLQ tenderness New.  -Monitor this.  If still present tomorrow, CT with contrast vs [place]      DVT prophylaxis: N/A on apixaban Code Status: FULL Family  Communication: Husband at bedside MDM and disposition Plan: Below labs and imaging reviewed.  Delirium seems to be better.    PT eval pending, dispo will be based on that.  I will ask Nephrology if they will need to wait for reepeat biopsy      Consultants:   Nephrology      Subjective: Hallucinations better.  No fever.  Oriented better this morning.  No new cough, sputum.    Objective: Vitals:   12/24/17 0731 12/24/17 0737 12/24/17 0800 12/24/17 0830  BP: (!) 179/83 (!) 175/51 (!) 173/58 (!) 181/63  Pulse: 70 72 70 72  Resp:      Temp: 98 F (36.7 C)     TempSrc: Oral     SpO2: 94%     Weight: 77 kg (169 lb 12.1 oz)     Height:        Intake/Output Summary (Last 24 hours) at 12/24/2017 0934 Last data filed at 12/23/2017 1300 Gross per 24 hour  Intake 200 ml  Output -  Net 200 ml   Filed Weights   12/23/17 1501 12/24/17 0520 12/24/17 0731  Weight: 79.9 kg (176 lb 2.4 oz) 73.9 kg (163 lb) 77 kg (169 lb 12.1 oz)    Examination: General appearance: Lying in dialysis bed.  HEENT: Amblyopia again.  Dentures.  No gum or lip abnormalities.   OP moist. Skin: No change to ulcer, black eschar base, punched edges.  No surrounding redness, pain induration warthm  Cardiac: Regular, no murmurs.  No LE eema Respiratory: Resp effort normal, no rales. No wheezes. Abdomen: Abdomen soft.  Mild RLQ ttp.  There is a subcutaenous cord?  Psych: More alert, responds to quesitons.  Oriented to [facility] hospital, to year.  Still responses somewhat slowed.  No hallucinations.    Data Reviewed: I have personally reviewed following labs and imaging studies:  CBC: Recent Labs  Lab 12/18/17 0627 12/19/17 0905 12/20/17 0640 12/22/17 0240  WBC 6.8 9.4 8.1 9.1  HGB 8.7* 7.9* 8.6* 8.1*  HCT 28.6* 25.9* 28.4* 26.2*  MCV 94.1 92.8 93.4 90.3  PLT 191 201 190 245   Basic Metabolic Panel: Recent Labs  Lab 12/19/17 0905 12/21/17 1338 12/22/17 0240 12/23/17 0556 12/24/17 0424  NA 133*  131* 129* 135 135  K 3.7 3.4* 3.8 3.6 3.9  CL 95* 95* 91* 96* 98*  CO2 26 26 25 28 27   GLUCOSE 108* 136* 127* 103* 100*  BUN 46* 48* 70* 26* 59*  CREATININE 4.40* 4.80* 5.27* 3.13* 4.81*  CALCIUM 8.4* 8.4* 8.6* 8.4* 8.7*  PHOS 2.5 2.9 3.0 2.3* 2.8   GFR: Estimated Creatinine Clearance: 10.8 mL/min (A) (by C-G formula based on SCr of 4.81 mg/dL (H)). Liver Function Tests: Recent Labs  Lab 12/19/17 0905 12/21/17 1338 12/22/17 0240 12/23/17 0556 12/24/17 0424  ALBUMIN 2.1* 2.2* 2.2* 2.2* 2.2*   No results for input(s): LIPASE, AMYLASE in the last 168 hours. Recent Labs  Lab 12/23/17 1527  AMMONIA 18   Coagulation Profile: Recent Labs  Lab 12/18/17 0627 12/19/17 1650 12/20/17 0640  INR 1.26 1.11 1.19   Cardiac Enzymes: No results for input(s): CKTOTAL, CKMB, CKMBINDEX, TROPONINI in the last 168 hours. BNP (last 3 results) No results for input(s): PROBNP in the last 8760 hours. HbA1C: No results for input(s): HGBA1C in the last 72 hours. CBG: Recent Labs  Lab 12/23/17 0809 12/23/17 1201 12/23/17 1653 12/23/17 2153 12/24/17 0722  GLUCAP 84 81 166* 142* 94   Lipid Profile: No results for input(s): CHOL, HDL, LDLCALC, TRIG, CHOLHDL, LDLDIRECT in the last 72 hours. Thyroid Function Tests: Recent Labs    12/23/17 1527  TSH 2.111   Anemia Panel: Recent Labs    12/23/17 1527  VITAMINB12 3,030*   Urine analysis:    Component Value Date/Time   COLORURINE AMBER (A) 11/19/2017 0300   APPEARANCEUR HAZY (A) 11/19/2017 0300   LABSPEC 1.021 11/19/2017 0300   PHURINE 5.0 11/19/2017 0300   GLUCOSEU NEGATIVE 11/19/2017 0300   HGBUR NEGATIVE 11/19/2017 0300   BILIRUBINUR NEGATIVE 11/19/2017 0300   KETONESUR NEGATIVE 11/19/2017 0300   PROTEINUR NEGATIVE 11/19/2017 0300   UROBILINOGEN 1.0 03/17/2014 1830   NITRITE NEGATIVE 11/19/2017 0300   LEUKOCYTESUR MODERATE (A) 11/19/2017 0300   Sepsis Labs: @LABRCNTIP (procalcitonin:4,lacticacidven:4)  ) Recent Results  (from the past 240 hour(s))  Culture, blood (Routine x 2)     Status: None   Collection Time: 12/14/17  4:30 PM  Result Value Ref Range Status   Specimen Description RIGHT ANTECUBITAL  Final   Special Requests   Final    BOTTLES DRAWN AEROBIC AND ANAEROBIC Blood Culture adequate volume   Culture   Final    NO GROWTH 5 DAYS Performed at [facility], [address], [place], [place] [address]    Report Status 12/19/2017 FINAL  Final  Culture, blood (Routine x 2)     Status: None   Collection Time: 12/14/17  4:31 PM  Result Value Ref Range Status   Specimen Description BLOOD RIGHT HAND  Final   Special Requests   Final    BOTTLES DRAWN AEROBIC AND ANAEROBIC Blood Culture adequate volume   Culture  Final    NO GROWTH 5 DAYS Performed at [facility], [address] [place], [place] [address]    Report Status 12/19/2017 FINAL  Final  MRSA PCR Screening     Status: None   Collection Time: 12/15/17  9:15 AM  Result Value Ref Range Status   MRSA by PCR NEGATIVE NEGATIVE Final    Comment:        The GeneXpert MRSA Assay (FDA approved for NASAL specimens only), is one component of a comprehensive MRSA colonization surveillance program. It is not intended to diagnose MRSA infection nor to guide or monitor treatment for MRSA infections. Performed at [facility], [address], [place], [place] [address]   Surgical pcr screen     Status: None   Collection Time: 12/17/17  7:49 PM  Result Value Ref Range Status   MRSA, PCR NEGATIVE NEGATIVE Final   Staphylococcus aureus NEGATIVE NEGATIVE Final    Comment: (NOTE) The Xpert SA Assay (FDA approved for NASAL specimens in patients 55 years of age and older), is one component of a comprehensive surveillance program. It is not intended to diagnose infection nor to guide or monitor treatment. Performed at [facility], [place] [address], [place], [place] [address]          Radiology Studies: No results  found.      Scheduled Meds: . apixaban  5 mg Oral BID  . atorvastatin  20 mg Oral Daily  . timolol  1 drop Left Eye Daily   And  . brimonidine  1 drop Left Eye Daily  . darbepoetin (ARANESP) injection - DIALYSIS  200 mcg Intravenous Q Thu-HD  . dicyclomine  10 mg Oral TID AC & HS  . febuxostat  40 mg Oral Daily  . feeding supplement (NEPRO CARB STEADY)  237 mL Oral Q24H  . feeding supplement (PRO-STAT SUGAR FREE 64)  30 mL Oral BID  . folic acid  1 mg Oral Daily  . hydroxychloroquine  300 mg Oral Daily  . insulin aspart  0-9 Units Subcutaneous TID WC  . ketorolac  1 drop Left Eye QID  . latanoprost  1 drop Left Eye QHS  . metoprolol tartrate  25 mg Oral Q T,Th,Sat-1800  . metoprolol tartrate  50 mg Oral 2 times per day on Sun Mon Wed Fri  . nutrition supplement (JUVEN)  1 packet Oral BID BM  . polyethylene glycol  17 g Oral BID  . predniSONE  5 mg Oral Q breakfast  . sodium chloride flush  3 mL Intravenous Q12H   Continuous Infusions: . sodium chloride    . ferric gluconate (FERRLECIT/NULECIT) IV Stopped (12/17/17 1620)  . sodium thiosulfate infusion for calciphylaxis       LOS: 10 days    Time spent: 35 minutes    Edwin Dada, MD [facility] 12/24/2017, 9:34 AM     Pager [phone] --- please page though AMION:  [email or web address] If 7PM-7AM, please contact night-coverage

## 2017-12-24 NOTE — Progress Notes (Signed)
CSW received consult regarding SNF placement. Per patient's husband, patient was at [facility] last month and used her 20 Medicare days. He reports being unable to pay the copays to send her back to SNF ($170/day). Unfortunately, unable to offer SNF assistance since patient has insurance. CSW explained that Medicare requires a 30 day wellness period before the SNF days will start over again. Patient's husband states they have tried to qualify for Medicaid but have too much income. Patient and her husband report that she had been set up with home health in [place] to come to the house. Will alert RNCM.   Percell Locus Frona Yost LCSW [phone]

## 2017-12-24 NOTE — Procedures (Signed)
After informed verbal consent was obtained, using Betadine for cleansing and 1% Lidocaine without epinephrine for anesthetic, with sterile technique a 4 mm punch biopsy was used to obtain a biopsy specimen of the lesion. Hemostasis was obtained by pressure and wound was sutured with one simple interrupted 4-0 prolene suture. Antibiotic dressing is applied, and wound care instructions provided. Be alert for any signs of cutaneous infection. The specimen is labeled and sent to pathology for evaluation. The procedure was well tolerated without complications.  Location of punch biopsy was: Right posterior lower leg  Jackson Latino, [facility] Pager [phone]

## 2017-12-24 NOTE — Progress Notes (Signed)
[facility] Progress Note   Subjective:  Seen on dialysis- feels "bad" this AM.  Having hallucinations yesterday.    Objective Vitals:   12/23/17 1744 12/23/17 2157 12/23/17 2312 12/24/17 0520  BP: (!) 164/70 (!) 145/59 (!) 145/59 (!) 173/31  Pulse: 67 66 66 71  Resp: 20 18 18 18   Temp: 98.3 F (36.8 C) 98.2 F (36.8 C) 98.2 F (36.8 C) 98.4 F (36.9 C)  TempSrc: Oral Oral Oral Oral  SpO2: 100% 100% 100% 93%  Weight:    73.9 kg (163 lb)  Height:       Physical Exam General: Frail female, NAD Heart: RRR; no murmur Lungs: CTAB Extremities: No LE edema, ~8x3 cm posterior calf necrotic eschar with clearly demarcated edges. Blood blister to R 3rd toe Dialysis Access: R TDC, LUE AVF (maturing)   Additional Objective Labs: Basic Metabolic Panel: Recent Labs  Lab 12/22/17 0240 12/23/17 0556 12/24/17 0424  NA 129* 135 135  K 3.8 3.6 3.9  CL 91* 96* 98*  CO2 25 28 27   GLUCOSE 127* 103* 100*  BUN 70* 26* 59*  CREATININE 5.27* 3.13* 4.81*  CALCIUM 8.6* 8.4* 8.7*  PHOS 3.0 2.3* 2.8   Liver Function Tests: Recent Labs  Lab 12/22/17 0240 12/23/17 0556 12/24/17 0424  ALBUMIN 2.2* 2.2* 2.2*   CBC: Recent Labs  Lab 12/17/17 0840 12/18/17 0627 12/19/17 0905 12/20/17 0640 12/22/17 0240  WBC 8.3 6.8 9.4 8.1 9.1  HGB 8.7* 8.7* 7.9* 8.6* 8.1*  HCT 28.1* 28.6* 25.9* 28.4* 26.2*  MCV 94.3 94.1 92.8 93.4 90.3  PLT 220 191 201 190 219   Medications: . sodium chloride    . ferric gluconate (FERRLECIT/NULECIT) IV Stopped (12/17/17 1620)  . sodium thiosulfate infusion for calciphylaxis     . apixaban  5 mg Oral BID  . atorvastatin  20 mg Oral Daily  . timolol  1 drop Left Eye Daily   And  . brimonidine  1 drop Left Eye Daily  . darbepoetin (ARANESP) injection - DIALYSIS  200 mcg Intravenous Q Thu-HD  . dicyclomine  10 mg Oral TID AC & HS  . febuxostat  40 mg Oral Daily  . feeding supplement (NEPRO CARB STEADY)  237 mL Oral Q24H  . feeding  supplement (PRO-STAT SUGAR FREE 64)  30 mL Oral BID  . folic acid  1 mg Oral Daily  . hydroxychloroquine  300 mg Oral Daily  . insulin aspart  0-9 Units Subcutaneous TID WC  . ketorolac  1 drop Left Eye QID  . latanoprost  1 drop Left Eye QHS  . metoprolol tartrate  25 mg Oral Q T,Th,Sat-1800  . metoprolol tartrate  50 mg Oral 2 times per day on Sun Mon Wed Fri  . nutrition supplement (JUVEN)  1 packet Oral BID BM  . polyethylene glycol  17 g Oral BID  . predniSONE  5 mg Oral Q breakfast  . sodium chloride flush  3 mL Intravenous Q12H    Dialysis Orders: TTS 4 hr Reids 400/800 3 K 2.5 Ca EDW 78kg right IJ TDC std Heparin, calcitriol 0.25 Mircera 50 last 3/5  Assessment/Plan: 1. RLE calf wound: arteriogram neg for arterial or venous cause of skin ulcer.  Possible calciphylaxis or warfarin skin necrosis.  Have dc'd Ca++containing and vit D meds and started Na thio with HD.  S/p deep tissue biopsy 12/21/2017- unfortunately there wasn't adequate subcutaneous tissue in the biopsy so she will need another biopsy for definitive diagnosis.  I have discussed this with pt and she's OK to proceed.  After biopsy done, please let pathology know to send the sample to derm pathology and that we are looking for calciphylaxis and/or warfarin skin necrosis. 2. ESRD: TTS HD. Has L 1st stage BVT placed in 2015.  Seen by VVS, holding on further surgery until leg lesion diagnosis is made.  Next HD 3/28  3. Hypertension/volume: BP ok, drops with HD, getting under EDW w/ HD, likely can be lowered slightly 4. Anemia: Hgb 8.1, Aranesp 100 given3/20 off schedule, increase dose to 242mcg weekly starting 3/28. +heme stools 2/23 during ED visit, no issues recently. 5. Metabolic bone disease: Phos low.  Given poss calciphylaxis -> d/c'd phoslo/ vit D and calcitriol. 6. Nutrition: Alb very low (2.2), continue Nepro/Pro-stat/Juven pwdr. 7. Afib- on chronic coumadin/MTP---> Coumadin stopped for poss calciphylaxis, now on  Eliquis. 8. SLE: On Plaquenil. 9. DM: On insulin, per primary 10. Dispo: pending   Madelon Lips MD 12/24/2017, 7:53 AM  [facility] Pager: [phone]

## 2017-12-24 NOTE — Procedures (Signed)
Patient seen and examined on Hemodialysis. QB 350 via R IJ TDC, UF goal 3L  Tolerating rx well so far.  Treatment adjusted as needed.  Madelon Lips MD [facility] pgr [phone] 7:56 AM

## 2017-12-24 NOTE — Progress Notes (Signed)
12/24/17 2000  PT Visit Information  Last PT Received On 12/24/17  Assistance Needed +1  History of Present Illness 72 y.o. female with cellulitis on RLE was admitted, had abd aortogram after admission.  Her recent admission was also for hallucinations.    Precautions  Precautions Fall  Restrictions  Weight Bearing Restrictions Yes  Home Living  Family/patient expects to be discharged to: Private residence  Living Arrangements Spouse/significant other  Available Help at Discharge Family  Type of [place] to enter  Entrance Stairs-Number of Steps 3  Entrance Stairs-Rails Right  Home Layout Two level  [place] - single point;Walker - 2 wheels;Wheelchair - Psychologist, occupational  Prior Function  Level of Independence Independent  Communication  Communication Expressive difficulties  Pain Assessment  Pain Assessment No/denies pain  Cognition  Arousal/Alertness Awake/alert  Behavior During Therapy WFL for tasks assessed/performed  Overall Cognitive Status Within Functional Limits for tasks assessed  Upper Extremity Assessment  Upper Extremity Assessment Generalized weakness  Lower Extremity Assessment  Lower Extremity Assessment Generalized weakness  Cervical / Trunk Assessment  Cervical / Trunk Assessment Normal  Bed Mobility  Overal bed mobility Modified Independent  Transfers  Overall transfer level Modified independent  Equipment used Rolling walker (2 wheeled)  General transfer comment Pt was prompted for physical assistance needed for the task  Ambulation/Gait  Ambulation Distance (Feet) 5 Feet  Assistive device Rolling walker (2 wheeled)  Gait Pattern/deviations Step-to pattern;Decreased stance time - left;Decreased weight shift to left;Shuffle;Trunk flexed;Narrow base of support  Gait velocity reduced  Gait velocity interpretation Below normal speed for age/gender  Balance   Overall balance assessment Needs assistance  Sitting-balance support Bilateral upper extremity supported;Feet supported  Sitting balance-Leahy Scale Good  Exercises  Exercises Other exercises (LE strength was noted to be less on hips)  PT - End of Session  Equipment Utilized During Treatment Gait belt  Patient left in bed;with call bell/phone within reach;with chair alarm set;with bed alarm set  Nurse Communication Mobility status  PT Assessment  PT Recommendation/Assessment Patient needs continued PT services  PT Visit Diagnosis Repeated falls (R29.6);Other abnormalities of gait and mobility (R26.89);Ataxic gait (R26.0)  PT Problem List Decreased strength;Decreased range of motion;Decreased activity tolerance;Decreased balance;Decreased mobility;Decreased coordination;Decreased knowledge of use of DME;Decreased knowledge of precautions;Impaired sensation;Cardiopulmonary status limiting activity  Barriers to Discharge Decreased caregiver support  PT Plan  PT Frequency (ACUTE ONLY) Min 2X/week  PT Treatment/Interventions (ACUTE ONLY) DME instruction;Gait training;Stair training;Therapeutic activities;Functional mobility training;Therapeutic exercise;Balance training;Cognitive remediation;Neuromuscular re-education;Wheelchair mobility training  AM-PAC PT "6 Clicks" Daily Activity Outcome Measure  Difficulty turning over in bed (including adjusting bedclothes, sheets and blankets)? 1  Difficulty moving from lying on back to sitting on the side of the bed?  1  Difficulty sitting down on and standing up from a chair with arms (e.g., wheelchair, bedside commode, etc,.)? 1  Help needed moving to and from a bed to chair (including a wheelchair)? 2  Help needed walking in hospital room? 2  Help needed climbing 3-5 steps with a railing?  2  6 Click Score 9  Mobility G Code  CL  PT Recommendation  Follow Up Recommendations CIR  PT equipment Rolling walker with 5" wheels  Individuals Consulted   Consulted and Agree with Results and Recommendations Patient  Acute Rehab PT Goals  PT Goal Formulation With patient  Time For Goal Achievement 01/06/18  Potential to Achieve Goals Good  PT Time Calculation  PT Start Time (ACUTE ONLY) 1521  PT Stop Time (ACUTE ONLY) 1553  PT Time Calculation (min) (ACUTE ONLY) 32 min  PT G-Codes **NOT FOR INPATIENT CLASS**  Functional Assessment Tool Used AM-PAC 6 Clicks Basic Mobility  PT General Charges  $$ ACUTE PT VISIT 1 Visit  PT Evaluation  $PT Eval Moderate Complexity 1 Mod  $PT Eval High Complexity 1 High  PT Treatments  $Gait Training 8-22 mins  Written Expression  Dominant Hand Right    Mee Hives, PT MS Acute Rehab Dept. Number: [place]

## 2017-12-25 LAB — CBC
HCT: 26.2 % — ABNORMAL LOW (ref 36.0–46.0)
Hemoglobin: 8 g/dL — ABNORMAL LOW (ref 12.0–15.0)
MCH: 28.9 pg (ref 26.0–34.0)
MCHC: 30.5 g/dL (ref 30.0–36.0)
MCV: 94.6 fL (ref 78.0–100.0)
PLATELETS: 235 10*3/uL (ref 150–400)
RBC: 2.77 MIL/uL — ABNORMAL LOW (ref 3.87–5.11)
RDW: 16.6 % — ABNORMAL HIGH (ref 11.5–15.5)
WBC: 7.5 10*3/uL (ref 4.0–10.5)

## 2017-12-25 LAB — RENAL FUNCTION PANEL
Albumin: 2.2 g/dL — ABNORMAL LOW (ref 3.5–5.0)
Anion gap: 9 (ref 5–15)
BUN: 31 mg/dL — AB (ref 6–20)
CHLORIDE: 98 mmol/L — AB (ref 101–111)
CO2: 28 mmol/L (ref 22–32)
CREATININE: 2.92 mg/dL — AB (ref 0.44–1.00)
Calcium: 8.4 mg/dL — ABNORMAL LOW (ref 8.9–10.3)
GFR calc Af Amer: 17 mL/min — ABNORMAL LOW (ref >60)
GFR calc non Af Amer: 15 mL/min — ABNORMAL LOW (ref >60)
GLUCOSE: 113 mg/dL — AB (ref 65–99)
Phosphorus: 1.8 mg/dL — ABNORMAL LOW (ref 2.5–4.6)
Potassium: 4 mmol/L (ref 3.5–5.1)
Sodium: 135 mmol/L (ref 135–145)

## 2017-12-25 LAB — GLUCOSE, CAPILLARY
GLUCOSE-CAPILLARY: 96 mg/dL (ref 65–99)
GLUCOSE-CAPILLARY: 177 mg/dL — AB (ref 65–99)
Glucose-Capillary: 76 mg/dL (ref 65–99)
Glucose-Capillary: 135 mg/dL — ABNORMAL HIGH (ref 65–99)

## 2017-12-25 MED ORDER — PENTAFLUOROPROP-TETRAFLUOROETH EX AERO: 1.0000 "application " | INHALATION_SPRAY | CUTANEOUS | Status: DC | PRN

## 2017-12-25 MED ORDER — LIDOCAINE-PRILOCAINE 2.5-2.5 % EX CREA: 1.0000 "application " | TOPICAL_CREAM | CUTANEOUS | Status: DC | PRN

## 2017-12-25 MED ORDER — SODIUM THIOSULFATE 25 % IV SOLN
25.0000 g | INTRAVENOUS | Status: DC
  Administered 2017-12-26 – 2017-12-29 (×2): 25 g via INTRAVENOUS
  Filled 2017-12-25 (×5): qty 100

## 2017-12-25 MED ORDER — ORAL CARE MOUTH RINSE
15.0000 mL | Freq: Two times a day (BID) | OROMUCOSAL | Status: DC
  Administered 2017-12-25 – 2017-12-28 (×6): 15 mL via OROMUCOSAL

## 2017-12-25 MED ORDER — SODIUM CHLORIDE 0.9 % IV SOLN: 100.0000 mL | INTRAVENOUS | Status: DC | PRN

## 2017-12-25 MED ORDER — HEPARIN SODIUM (PORCINE) 1000 UNIT/ML DIALYSIS: 1000.0000 [IU] | INTRAMUSCULAR | Status: DC | PRN

## 2017-12-25 MED ORDER — ALTEPLASE 2 MG IJ SOLR: 2.0000 mg | Freq: Once | INTRAMUSCULAR | Status: DC | PRN

## 2017-12-25 NOTE — Progress Notes (Signed)
Surgery:  Second biopsy performed yesterday Dermatopathology pending Bandage clean and dry.  We will check on her next week for suture removal.

## 2017-12-25 NOTE — Progress Notes (Signed)
[facility] Progress Note   Subjective:  Pleasant, no complaints unless wound on calf touched. Oriented to person, place, a bit confused on time. Can't remember where she has dialysis.   Objective Vitals:   12/24/17 2119 12/25/17 0453 12/25/17 0925 12/25/17 0936  BP: (!) 153/34 (!) 177/43 (!) 188/42 (!) 188/42  Pulse: 70 69 76 76  Resp:  18    Temp:  98.1 F (36.7 C)    TempSrc:  Oral    SpO2: 100% 96%  99%  Weight:  76 kg (167 lb 8.8 oz)    Height:       Physical Exam General: pleasant elderly female in NAD Heart: S1,S2 RRR No JVD.  Lungs: CTAB Abdomen: Active BS Extremities: No LE edema.R posterior calf ulcer, exquisitely tender to touch. Eschar in wound bed. Blood blister R toe.  Dialysis Access: LUA AVF + bruit. RIJ TDC Drsg CDI.    Additional Objective Labs: Basic Metabolic Panel: Recent Labs  Lab 12/23/17 0556 12/24/17 0424 12/25/17 0602  NA 135 135 135  K 3.6 3.9 4.0  CL 96* 98* 98*  CO2 28 27 28   GLUCOSE 103* 100* 113*  BUN 26* 59* 31*  CREATININE 3.13* 4.81* 2.92*  CALCIUM 8.4* 8.7* 8.4*  PHOS 2.3* 2.8 1.8*   Liver Function Tests: Recent Labs  Lab 12/23/17 0556 12/24/17 0424 12/25/17 0602  ALBUMIN 2.2* 2.2* 2.2*   No results for input(s): LIPASE, AMYLASE in the last 168 hours. CBC: Recent Labs  Lab 12/19/17 0905 12/20/17 0640 12/22/17 0240 12/23/17 1527 12/24/17 0916 12/25/17 0636  WBC 9.4 8.1 9.1  --  8.5 7.5  HGB 7.9* 8.6* 8.1*  --  7.2* 8.0*  HCT 25.9* 28.4* 26.2* 24.9* 24.3* 26.2*  MCV 92.8 93.4 90.3  --  93.1 94.6  PLT 201 190 219  --  235 235   Blood Culture    Component Value Date/Time   SDES BLOOD RIGHT HAND 12/14/2017 1631   SPECREQUEST  12/14/2017 1631    BOTTLES DRAWN AEROBIC AND ANAEROBIC Blood Culture adequate volume   CULT  12/14/2017 1631    NO GROWTH 5 DAYS Performed at [facility], [address] [place], [place] [address]    REPTSTATUS 12/19/2017 FINAL 12/14/2017 1631    Cardiac Enzymes: No  results for input(s): CKTOTAL, CKMB, CKMBINDEX, TROPONINI in the last 168 hours. CBG: Recent Labs  Lab 12/24/17 1423 12/24/17 1621 12/24/17 1646 12/24/17 2136 12/25/17 0801  GLUCAP 108* 142* 103* 210* 76   Iron Studies: No results for input(s): IRON, TIBC, TRANSFERRIN, FERRITIN in the last 72 hours. @lablastinr3 @ Studies/Results: No results found. Medications: . sodium chloride    . ferric gluconate (FERRLECIT/NULECIT) IV Stopped (12/24/17 1649)   . apixaban  5 mg Oral BID  . atorvastatin  20 mg Oral Daily  . timolol  1 drop Left Eye Daily   And  . brimonidine  1 drop Left Eye Daily  . darbepoetin (ARANESP) injection - DIALYSIS  200 mcg Intravenous Q Thu-HD  . dicyclomine  10 mg Oral TID AC & HS  . febuxostat  40 mg Oral Daily  . feeding supplement (NEPRO CARB STEADY)  237 mL Oral Q24H  . feeding supplement (PRO-STAT SUGAR FREE 64)  30 mL Oral BID  . folic acid  1 mg Oral Daily  . hydroxychloroquine  300 mg Oral Daily  . insulin aspart  0-9 Units Subcutaneous TID WC  . ketorolac  1 drop Left Eye QID  . latanoprost  1 drop Left Eye QHS  . mouth rinse  15 mL Mouth Rinse BID  . metoprolol tartrate  25 mg Oral Q T,Th,Sat-1800  . metoprolol tartrate  50 mg Oral 2 times per day on Sun Mon Wed Fri  . nutrition supplement (JUVEN)  1 packet Oral BID BM  . polyethylene glycol  17 g Oral BID  . predniSONE  5 mg Oral Q breakfast  . sodium chloride flush  3 mL Intravenous Q12H     Dialysis Orders: TTS 4 hr Reids 400/800 3 K 2.5 Ca EDW 78kg right IJ TDC std Heparin, calcitriol 0.25 Mircera 50 last 3/5  Assessment/Plan: 1. RLE calf wound: arteriogram neg for arterial or venous cause of skin ulcer. Possible calciphylaxis or warfarin skin necrosis. Have dc'd Ca++containing and vit D meds and started Na thio with HD.S/p deep tissue biopsy 12/21/2017- unfortunately there wasn't adequate subcutaneous tissue in the biopsy so she had second biopsy 12/24/17 for definitive diagnosis.   After biopsy done, please let pathology know to send the sample to derm pathologyand that we are looking for calciphylaxis and/or warfarin skin necrosis. Was being treated with Sodium Thiosulfate but this was stopped by primary D/T hallucinations. No experience in OP clinic or hospitals with use of this drug causing hallucinations-have discussed with pharmacy and will continue.  2. ESRD: TTS HD. Has L 1st stage BVT placed in 2015. Seen by [facility], holding on further surgery until leg lesion diagnosis is made. Next HD 12/26/17  3. Hypertension/volume: HD 12/25/17 Pre wt 77 kg Net UF 1 liter Post wt 75 kg (?). Concerned about accuracy of wts. Does not appear volume overloaded by exam 0.5-1 liter UFG tomorrow. Lower EDW on DC.  4. Anemia: Hgb 8.1, Aranesp 100 given3/20 off schedule, increase dose to 229mcg weekly starting 3/28. +heme stools 2/23 during ED visit, no issues recently. 5. Metabolic bone disease: Phos low. CA 8.4. Given poss calciphylaxis -> d/c'd phoslo/ vit D and calcitriol. 6. Nutrition: Alb very low (2.2), continue Nepro/Pro-stat/Juven pwdr. 7. Afib- on chronic coumadin/MTP---> Coumadin stopped for poss calciphylaxis, now on Eliquis. 8. SLE: On Plaquenil. 9. DM: On insulin, per primary 10. Dispo: pending     Rita H. Brown NP-C 12/25/2017, 11:09 AM  [employer] [phone]

## 2017-12-25 NOTE — Progress Notes (Signed)
PROGRESS NOTE    [name]  MBW:[id number] DOB: 1946/07/03 DOA: 12/14/2017 PCP: Sharilyn Sites, MD      Brief Narrative:  This is Petitti is a 72 year old female with ESRD, COPD, A. fib, lupus, diabetes, HTN, DVT who presents with right leg ulcer.  She was started on empiric antibiotics, without improvement.  Vascular surgery was consulted, but duplex ultrasounds of the leg showed no evidence of stenosis.    Biopsy was obtained 3/25, not definitive.  Second one obtained 3/28.   Assessment & Plan:  Right leg ulcer Possible calciphylaxis versus warfarin skin necrosis.  Biopsy today shows nonspecific findings, no help in diagnosis.  Other considerations could be pyoderma gangrenosum or lupus.   Hallucinations No change to hallucinations.  Oxycodone stopped several days ago and no improvement. ?  Malingering.  Other differential includes psychosis from lupus (vasculitis is probably also a consideration for her leg wound) -Hold opiates - Haldol as needed - MRI brain ordered -Check C3/C4, anti-DS DNA       Atrial fibrillation, paroxysmal Chads 2 Vasc 4.  Rate controlled.  Anticoagulated previously with warfarin. -Continue apixaban, metoprolol  ESRD -Consulted nephrology for HD, appreciate cares  Lupus -Continue home prednisone, Plaquenil  Diabetes Well controlled -Restart Levemir tomorrow -Continue SSI  COPD No evidence of disease  Chronic constipation -Continue MiraLAX twice daily  Other medications -Continue statin -Continue eye drops -Continue Uloric -Continue dicyclomine   RLQ tenderness This is a new complaint to me, however per her husband, this is a chronic thing, she has had for noncontrasted CT abdomens in the first 4 months of this year.  No further work-up.  This is probably a panniculitis.     DVT prophylaxis: N/A on apixaban Code Status: FULL Family Communication: Husband at bedside MDM and disposition Plan: Below labs and imaging  are reviewed.  Her hallucinations persisted.  Although she seems fairly oriented.  PT recommended CIR, she has been evaluated for [facility] authorization is pending.  Neurology did not need to wait for repeat biopsy.  Consultants:   Nephrology      Subjective: Persistent hallucinations.  No fever, cough, sputum, abdominal pain other than old abdominal wall pain.  She is oriented to place      Resting in bed, eating lunch. Objective: Vitals:   12/24/17 2119 12/25/17 0453 12/25/17 0925 12/25/17 0936  BP: (!) 153/34 (!) 177/43 (!) 188/42 (!) 188/42  Pulse: 70 69 76 76  Resp:  18    Temp:  98.1 F (36.7 C)    TempSrc:  Oral    SpO2: 100% 96%  99%  Weight:  76 kg (167 lb 8.8 oz)    Height:        Intake/Output Summary (Last 24 hours) at 12/25/2017 1409 Last data filed at 12/25/2017 1000 Gross per 24 hour  Intake 240 ml  Output -  Net 240 ml   Filed Weights   12/24/17 0731 12/24/17 1139 12/25/17 0453  Weight: 77 kg (169 lb 12.1 oz) 75 kg (165 lb 5.5 oz) 76 kg (167 lb 8.8 oz)    Examination: General appearance: Resting in bed eating lunch HEENT: Amblyopia unchanged, dentures in place, OP is moist, no lesions. Skin: No new redness swelling, induration, or warmth around her large stable right calf ulcer with black eschar.    Cardiac: Heart rate regular, no murmurs.  No lower extremity edema. Respiratory: Respiratory effort normal, no rales, no wheezes zes. Abdomen: Abdomen soft, no tenderness to palpation, although she has  some tenderness in the right lower pannus, seems like a panniculitis. Psych: Still hallucinating that there are bodies or frogs on the windowsill.  However she is oriented to come in hospital today as opposed to yesterday afternoon when she thought she was at any pain.  She knows the month and year if prompted.       Data Reviewed: I have personally reviewed following labs and imaging studies:  CBC: Recent Labs  Lab 12/19/17 0905 12/20/17 0640  12/22/17 0240 12/23/17 1527 12/24/17 0916 12/25/17 0636  WBC 9.4 8.1 9.1  --  8.5 7.5  HGB 7.9* 8.6* 8.1*  --  7.2* 8.0*  HCT 25.9* 28.4* 26.2* 24.9* 24.3* 26.2*  MCV 92.8 93.4 90.3  --  93.1 94.6  PLT 201 190 219  --  235 389   Basic Metabolic Panel: Recent Labs  Lab 12/21/17 1338 12/22/17 0240 12/23/17 0556 12/24/17 0424 12/25/17 0602  NA 131* 129* 135 135 135  K 3.4* 3.8 3.6 3.9 4.0  CL 95* 91* 96* 98* 98*  CO2 26 25 28 27 28   GLUCOSE 136* 127* 103* 100* 113*  BUN 48* 70* 26* 59* 31*  CREATININE 4.80* 5.27* 3.13* 4.81* 2.92*  CALCIUM 8.4* 8.6* 8.4* 8.7* 8.4*  PHOS 2.9 3.0 2.3* 2.8 1.8*   GFR: Estimated Creatinine Clearance: 17.8 mL/min (A) (by C-G formula based on SCr of 2.92 mg/dL (H)). Liver Function Tests: Recent Labs  Lab 12/21/17 1338 12/22/17 0240 12/23/17 0556 12/24/17 0424 12/25/17 0602  ALBUMIN 2.2* 2.2* 2.2* 2.2* 2.2*   No results for input(s): LIPASE, AMYLASE in the last 168 hours. Recent Labs  Lab 12/23/17 1527  AMMONIA 18   Coagulation Profile: Recent Labs  Lab 12/19/17 1650 12/20/17 0640  INR 1.11 1.19   Cardiac Enzymes: No results for input(s): CKTOTAL, CKMB, CKMBINDEX, TROPONINI in the last 168 hours. BNP (last 3 results) No results for input(s): PROBNP in the last 8760 hours. HbA1C: No results for input(s): HGBA1C in the last 72 hours. CBG: Recent Labs  Lab 12/24/17 1621 12/24/17 1646 12/24/17 2136 12/25/17 0801 12/25/17 1224  GLUCAP 142* 103* 210* 76 96   Lipid Profile: No results for input(s): CHOL, HDL, LDLCALC, TRIG, CHOLHDL, LDLDIRECT in the last 72 hours. Thyroid Function Tests: Recent Labs    12/23/17 1527  TSH 2.111   Anemia Panel: Recent Labs    12/23/17 1527  VITAMINB12 3,030*   Urine analysis:    Component Value Date/Time   COLORURINE AMBER (A) 11/19/2017 0300   APPEARANCEUR HAZY (A) 11/19/2017 0300   LABSPEC 1.021 11/19/2017 0300   PHURINE 5.0 11/19/2017 0300   GLUCOSEU NEGATIVE 11/19/2017 0300    HGBUR NEGATIVE 11/19/2017 0300   BILIRUBINUR NEGATIVE 11/19/2017 0300   KETONESUR NEGATIVE 11/19/2017 0300   PROTEINUR NEGATIVE 11/19/2017 0300   UROBILINOGEN 1.0 03/17/2014 1830   NITRITE NEGATIVE 11/19/2017 0300   LEUKOCYTESUR MODERATE (A) 11/19/2017 0300   Sepsis Labs: @LABRCNTIP (procalcitonin:4,lacticacidven:4)  ) Recent Results (from the past 240 hour(s))  Surgical pcr screen     Status: None   Collection Time: 12/17/17  7:49 PM  Result Value Ref Range Status   MRSA, PCR NEGATIVE NEGATIVE Final   Staphylococcus aureus NEGATIVE NEGATIVE Final    Comment: (NOTE) The Xpert SA Assay (FDA approved for NASAL specimens in patients 21 years of age and older), is one component of a comprehensive surveillance program. It is not intended to diagnose infection nor to guide or monitor treatment. Performed at [facility], [address] [place], [place] [address]          Radiology Studies: No results found.      Scheduled Meds: . apixaban  5 mg Oral BID  . atorvastatin  20 mg Oral Daily  . timolol  1 drop Left Eye Daily   And  . brimonidine  1 drop Left Eye Daily  . darbepoetin (ARANESP) injection - DIALYSIS  200 mcg Intravenous Q Thu-HD  . febuxostat  40 mg Oral Daily  . feeding supplement (NEPRO CARB STEADY)  237 mL Oral Q24H  . feeding supplement (PRO-STAT SUGAR FREE 64)  30 mL Oral BID  . folic acid  1 mg Oral Daily  . hydroxychloroquine  300 mg Oral Daily  . insulin aspart  0-9 Units Subcutaneous TID WC  . ketorolac  1 drop Left Eye QID  . latanoprost  1 drop Left Eye QHS  . mouth rinse  15 mL Mouth Rinse BID  . metoprolol tartrate  25 mg Oral Q T,Th,Sat-1800  . metoprolol tartrate  50 mg Oral 2 times per day on Sun Mon Wed Fri  . nutrition supplement (JUVEN)  1 packet Oral BID BM  . polyethylene glycol  17 g Oral BID  . predniSONE  5 mg Oral Q breakfast  . sodium chloride flush  3 mL Intravenous Q12H   Continuous Infusions: . sodium chloride     . sodium chloride    . sodium chloride    . ferric gluconate (FERRLECIT/NULECIT) IV Stopped (12/24/17 1649)  . [START ON 12/26/2017] sodium thiosulfate infusion for calciphylaxis       LOS: 11 days    Time spent: 25 minutes    Edwin Dada, MD [facility] 12/25/2017, 2:09 PM     Pager [phone] --- please page though AMION:  [email or web address] If 7PM-7AM, please contact night-coverage

## 2017-12-25 NOTE — Progress Notes (Signed)
Physical Therapy Treatment Patient Details Name: [name] FIRST MRN: [id number] DOB: 08/04/1946 Today's Date: 12/25/2017    History of Present Illness 72 y.o. female with cellulitis on RLE was admitted, had abd aortogram after admission.  Her recent admission was also for hallucinations.      PT Comments    Pt performed increased activity and progressed a short distance.  Pt with poor balance and required min assistance for functional mobility.  Pt has support from husband when she is able to return home.  Pt remains an excellent candidate for CIR therapies to improve strength and function back to baseline before returning to private residence.     Follow Up Recommendations  CIR     Equipment Recommendations  Rolling walker with 5" wheels    Recommendations for Other Services       Precautions / Restrictions Precautions Precautions: Fall Restrictions Weight Bearing Restrictions: Yes    Mobility  Bed Mobility Overal bed mobility: Needs Assistance Bed Mobility: Supine to Sit;Sit to Supine     Supine to sit: Supervision Sit to supine: Supervision   General bed mobility comments: Cues for technique for optimal postioning  Transfers Overall transfer level: Needs assistance Equipment used: Rolling walker (2 wheeled) Transfers: Sit to/from Stand Sit to Stand: Min assist         General transfer comment: Cues for hand placement to and from seated surface.  Pt with poor balance in standing and LOB laterally when turning.  Assist to boost into standing with heavy reliance on RW to regain and maintain balance.    Ambulation/Gait Ambulation/Gait assistance: Min assist Ambulation Distance (Feet): 40 Feet Assistive device: Rolling walker (2 wheeled) Gait Pattern/deviations: Step-to pattern;Decreased stance time - left;Decreased weight shift to left;Shuffle;Trunk flexed;Narrow base of support Gait velocity: reduced Gait velocity interpretation: Below normal speed for  age/gender General Gait Details: Pt with poor awareness to maintain close proximity to RW.  Able to progress gait distance.  Pt does present with LOB and unsteadiness during turns.     Stairs            Wheelchair Mobility    Modified Rankin (Stroke Patients Only)       Balance Overall balance assessment: Needs assistance   Sitting balance-Leahy Scale: Good       Standing balance-Leahy Scale: Fair                              Cognition Arousal/Alertness: Awake/alert Behavior During Therapy: WFL for tasks assessed/performed Overall Cognitive Status: Within Functional Limits for tasks assessed                                        Exercises      General Comments        Pertinent Vitals/Pain Pain Assessment: No/denies pain    Home Living                      Prior Function            PT Goals (current goals can now be found in the care plan section) Acute Rehab PT Goals Potential to Achieve Goals: Good Progress towards PT goals: Progressing toward goals    Frequency    Min 2X/week      PT Plan Current plan remains appropriate    Co-evaluation  AM-PAC PT "6 Clicks" Daily Activity  Outcome Measure  Difficulty turning over in bed (including adjusting bedclothes, sheets and blankets)?: Unable Difficulty moving from lying on back to sitting on the side of the bed? : Unable Difficulty sitting down on and standing up from a chair with arms (e.g., wheelchair, bedside commode, etc,.)?: Unable Help needed moving to and from a bed to chair (including a wheelchair)?: A Lot Help needed walking in hospital room?: A Lot Help needed climbing 3-5 steps with a railing? : A Lot 6 Click Score: 9    End of Session Equipment Utilized During Treatment: Gait belt Activity Tolerance: Patient tolerated treatment well Patient left: in bed;with call bell/phone within reach;with chair alarm set;with bed alarm  set Nurse Communication: Mobility status PT Visit Diagnosis: Repeated falls (R29.6);Other abnormalities of gait and mobility (R26.89);Ataxic gait (R26.0)     Time: 7414-2395 PT Time Calculation (min) (ACUTE ONLY): 17 min  Charges:  $Gait Training: 8-22 mins                    G Codes:       Governor Rooks, PTA pager [phone]    Cristela Blue 12/25/2017, 2:52 PM

## 2017-12-25 NOTE — Consult Note (Signed)
Physical Medicine and Rehabilitation Consult Reason for Consult:Debility Referring Physician: [place]   HPI: [name] is a 72 y.o. 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 Lesion biopsied and inconclusive. Pt with hallucinations/delirium which have shown improvement. Pt has been up with therapy and has displayed issues with balance and mobility. PM&R asked to assess pt for potential rehab admission   ROS Past Medical History:  Diagnosis Date  . Anemia   . Arthritis   . Asthma   . Atrial fibrillation ([place])   . Atrial flutter [facility])    TEE/DCCV December 2013 - on Xarelto [facility])  . Bone spur of toe    Right 5th toe  . Chronic diastolic CHF (congestive heart failure) (HCC)    a. EF 50-55% by echo in 2015  . CKD (chronic kidney disease) stage 4, GFR 15-29 ml/min (HCC)    a. s/p fistula placement but not on HD. Followed by Nephrology.   . Colon polyposis   . COPD (chronic obstructive pulmonary disease) ([place])   . DVT of upper extremity (deep vein thrombosis) ([place])    Right basilic vein, June 9811  . Essential hypertension, benign   . Fractures   . Glaucoma   . SLE (systemic lupus erythematosus) ([place])   . Type 2 diabetes mellitus ([place])   . UTI (lower urinary tract infection)    Past Surgical History:  Procedure Laterality Date  . ABDOMINAL AORTOGRAM W/LOWER EXTREMITY N/A 12/18/2017   Procedure: ABDOMINAL AORTOGRAM W/LOWER EXTREMITY;  Surgeon: Elam Dutch, MD;  Location: [facility] [facility];  Service: Cardiovascular;  Laterality: N/A;  bilateral  . ABDOMINAL HYSTERECTOMY  1983  . ANKLE SURGERY  1993  . AV FISTULA PLACEMENT Left 03/27/2014   Procedure: ARTERIOVENOUS (AV) FISTULA CREATION;  Surgeon: Rosetta Posner, MD;  Location: [facility];  Service: Vascular;  Laterality: Left;  . BACK SURGERY  1980  . CARDIOVASCULAR STRESS TEST  12/19/2009   no stress induced rhythm abnormalities,  ekg negative for ischemia  . CARDIOVERSION  09/16/2012   Procedure: CARDIOVERSION;  Surgeon: Sanda Klein, MD;  Location: [facility] ENDOSCOPY;  Service: Cardiovascular;  Laterality: N/A;  . COLONOSCOPY  09/20/2012   Dr. [name]: multiple tubular adenomas   . COLONOSCOPY  2005   Dr. [name]. Polyps, path unknown   . COLONOSCOPY N/A 07/24/2016   Procedure: COLONOSCOPY;  Surgeon: [name], MD;  [facility];  Service: Endoscopy;  Laterality: N/A;  9:00 am  . ESOPHAGOGASTRODUODENOSCOPY  09/20/2012   Dr. [name]: duodenal erosion and possible resolving ulcer at the angularis   . IR FLUORO GUIDE CV LINE RIGHT  11/09/2017  . IR US GUIDE VASC ACCESS RIGHT  11/09/2017  . TEE WITHOUT CARDIOVERSION  09/16/2012   Procedure: TRANSESOPHAGEAL ECHOCARDIOGRAM (TEE);  Surgeon: Sanda Klein, MD;  Location: [facility] ENDOSCOPY;  Service: Cardiovascular;  Laterality: N/A;  . TRANSESOPHAGEAL ECHOCARDIOGRAM WITH CARDIOVERSION  09/16/2012   EF 60-65%, moderate LVH, moderate regurg of the aortic valve, LA moderately dilated   Family History  Problem Relation Age of Onset  . Diabetes Brother   . Diabetes Father   . Hypertension Father   . Hypertension Sister   . Stroke Mother   . Diabetes Sister   . Hypertension Brother   . Colon cancer Neg Hx    Social History:  reports that she quit smoking about 5 years ago. Her smoking use included cigarettes.  She has a 30.00 pack-year smoking history. She has never used smokeless tobacco. She reports that she does not drink alcohol or use drugs. Allergies:  Allergies  Allergen Reactions  . Ace Inhibitors Cough   Medications Prior to Admission  Medication Sig Dispense Refill  . acetaminophen (TYLENOL) 500 MG tablet Take 1,000 mg by mouth every 6 (six) hours as needed. For pain    . albuterol (ACCUNEB) 0.63 MG/3ML nebulizer solution Take 1 ampule by nebulization every 6 (six) hours as needed for wheezing or shortness of breath.     Marland Kitchen albuterol (PROVENTIL  HFA;VENTOLIN HFA) 108 (90 BASE) MCG/ACT inhaler Inhale 2 puffs into the lungs every 6 (six) hours as needed. For shortness of breath    . atorvastatin (LIPITOR) 20 MG tablet Take 20 mg by mouth daily.    . brimonidine-timolol (COMBIGAN) 0.2-0.5 % ophthalmic solution Place 1 drop into the left eye daily.     . calcium acetate (PHOSLO) 667 MG capsule Take 2 capsules (1,334 mg total) by mouth 3 (three) times daily with meals. 90 capsule 0  . collagenase (SANTYL) ointment Apply 1 application topically daily.    . diclofenac (VOLTAREN) 0.1 % ophthalmic solution Place 1 drop into the left eye daily. Uses at lunch time    . dicyclomine (BENTYL) 10 MG capsule Take 10 mg by mouth 4 (four) times daily -  before meals and at bedtime.    . febuxostat (ULORIC) 40 MG tablet Take 40 mg by mouth daily.    . folic acid (FOLVITE) 1 MG tablet Take 1 mg by mouth daily.    Marland Kitchen HYDROcodone-acetaminophen (NORCO/VICODIN) 5-325 MG tablet Take 1-2 tablets by mouth every 8 (eight) hours as needed for moderate pain. 60 tablet 0  . hydroxychloroquine (PLAQUENIL) 200 MG tablet Take 300 mg by mouth as directed. Take 1 1/2 tablets daily    . Insulin Detemir (LEVEMIR FLEXTOUCH) 100 UNIT/ML Pen Inject 7 Units into the skin daily at 10 pm.    . insulin lispro (HUMALOG KWIKPEN) 100 UNIT/ML KiwkPen Give per sliding scale from 2-10 units subcutaneous three times a day.    . iron polysaccharides (NIFEREX) 150 MG capsule Take 150 mg by mouth 3 (three) times daily.     . metoprolol tartrate (LOPRESSOR) 50 MG tablet Take 50 mg by mouth 2 (two) times daily. Take 1 tablet twice a day, except on Tuesday, Thursday, Saturday, take 1 tablet daily in the morning.    . Multiple Vitamins-Minerals (CENTRUM SILVER PO) Take 1 tablet by mouth daily.    . predniSONE (DELTASONE) 5 MG tablet Take 5 mg by mouth daily with breakfast.    . travoprost, benzalkonium, (TRAVATAN) 0.004 % ophthalmic solution Place 1 drop into the left eye at bedtime.    . Vitamin  D, Ergocalciferol, (DRISDOL) 50000 units CAPS capsule TAKE 1 CAPSULE BY MOUTH ONCE WEEKLY. 12 capsule 0  . warfarin (COUMADIN) 4 MG tablet Take 4 mg by mouth daily.      Home: Home Living Family/patient expects to be discharged to:: Private residence Living Arrangements: Spouse/significant other Available Help at Discharge: Family Type of Home: House Home Access: Stairs to enter Technical brewer of Steps: 3 Entrance Stairs-Rails: Right Home Layout: Two level Bathroom Toilet: Standard Bathroom Accessibility: Yes Home Equipment: [place] - single point, Environmental consultant - 2 wheels, Wheelchair - manual, Shower seat, Bedside commode  Functional History: Prior Function Level of Independence: Independent Functional Status:  Mobility: Bed Mobility Overal bed mobility: Needs Assistance Bed Mobility: Supine to  Sit, Sit to Supine Supine to sit: Supervision Sit to supine: Supervision General bed mobility comments: Cues for technique for optimal postioning Transfers Overall transfer level: Needs assistance Equipment used: Rolling walker (2 wheeled) Transfers: Sit to/from Stand Sit to Stand: Min assist General transfer comment: Cues for hand placement to and from seated surface.  Pt with poor balance in standing and LOB laterally when turning.  Assist to boost into standing with heavy reliance on RW to regain and maintain balance.   Ambulation/Gait Ambulation/Gait assistance: Min assist Ambulation Distance (Feet): 40 Feet Assistive device: Rolling walker (2 wheeled) Gait Pattern/deviations: Step-to pattern, Decreased stance time - left, Decreased weight shift to left, Shuffle, Trunk flexed, Narrow base of support General Gait Details: Pt with poor awareness to maintain close proximity to RW.  Able to progress gait distance.  Pt does present with LOB and unsteadiness during turns.   Gait velocity: reduced Gait velocity interpretation: Below normal speed for age/gender    ADL:     Cognition: Cognition Overall Cognitive Status: Within Functional Limits for tasks assessed Orientation Level: Oriented to person, Oriented to place, Oriented to situation Cognition Arousal/Alertness: Awake/alert Behavior During Therapy: WFL for tasks assessed/performed Overall Cognitive Status: Within Functional Limits for tasks assessed  Blood pressure (!) 155/64, pulse 70, temperature 98 F (36.7 C), temperature source Oral, resp. rate 19, height 5\' 5"  (1.651 m), weight 76 kg (167 lb 8.8 oz), SpO2 97 %. Physical Exam  Constitutional: She is oriented to person, place, and time. She appears well-developed.  Eyes: Pupils are equal, round, and reactive to light.  Cardiovascular: Normal rate.  Respiratory: Effort normal.  Musculoskeletal: Normal range of motion.  Neurological: She is alert and oriented to person, place, and time.    Results for orders placed or performed during the hospital encounter of 12/14/17 (from the past 24 hour(s))  Glucose, capillary     Status: Abnormal   Collection Time: 12/24/17  9:36 PM  Result Value Ref Range   Glucose-Capillary 210 (H) 65 - 99 mg/dL  Renal function panel     Status: Abnormal   Collection Time: 12/25/17  6:02 AM  Result Value Ref Range   Sodium 135 135 - 145 mmol/L   Potassium 4.0 3.5 - 5.1 mmol/L   Chloride 98 (L) 101 - 111 mmol/L   CO2 28 22 - 32 mmol/L   Glucose, Bld 113 (H) 65 - 99 mg/dL   BUN 31 (H) 6 - 20 mg/dL   Creatinine, Ser 2.92 (H) 0.44 - 1.00 mg/dL   Calcium 8.4 (L) 8.9 - 10.3 mg/dL   Phosphorus 1.8 (L) 2.5 - 4.6 mg/dL   Albumin 2.2 (L) 3.5 - 5.0 g/dL   GFR calc non Af Amer 15 (L) >60 mL/min   GFR calc Af Amer 17 (L) >60 mL/min   Anion gap 9 5 - 15  CBC     Status: Abnormal   Collection Time: 12/25/17  6:36 AM  Result Value Ref Range   WBC 7.5 4.0 - 10.5 K/uL   RBC 2.77 (L) 3.87 - 5.11 MIL/uL   Hemoglobin 8.0 (L) 12.0 - 15.0 g/dL   HCT 26.2 (L) 36.0 - 46.0 %   MCV 94.6 78.0 - 100.0 fL   MCH 28.9 26.0 - 34.0  pg   MCHC 30.5 30.0 - 36.0 g/dL   RDW 16.6 (H) 11.5 - 15.5 %   Platelets 235 150 - 400 K/uL  Glucose, capillary     Status: None   Collection Time:  12/25/17  8:01 AM  Result Value Ref Range   Glucose-Capillary 76 65 - 99 mg/dL  Glucose, capillary     Status: None   Collection Time: 12/25/17 12:24 PM  Result Value Ref Range   Glucose-Capillary 96 65 - 99 mg/dL  Glucose, capillary     Status: Abnormal   Collection Time: 12/25/17  4:05 PM  Result Value Ref Range   Glucose-Capillary 177 (H) 65 - 99 mg/dL   No results found.  Assessment/Plan: Diagnosis: Functional deficits secondary to right leg ulcer, encephalopathy/delirium 1. Does the need for close, 24 hr/day medical supervision in concert with the patient's rehab needs make it unreasonable for this patient to be served in a less intensive setting? Potentially 2. Co-Morbidities requiring supervision/potential complications: ESRD, DM, Afib, COPD 3. Due to bladder management, bowel management, safety, skin/wound care, disease management, medication administration, pain management and patient education, does the patient require 24 hr/day rehab nursing? Yes 4. Does the patient require coordinated care of a physician, rehab nurse, PT (1-2 hrs/day, 5 days/week) and OT (1-2 hrs/day, 5 days/week) to address physical and functional deficits in the context of the above medical diagnosis(es)? Yes Addressing deficits in the following areas: balance, endurance, locomotion, strength, transferring, bowel/bladder control, bathing, dressing, feeding, grooming and psychosocial support 5. Can the patient actively participate in an intensive therapy program of at least 3 hrs of therapy per day at least 5 days per week? Yes and Potentially 6. The potential for patient to make measurable gains while on inpatient rehab is excellent 7. Anticipated functional outcomes upon discharge from inpatient rehab are modified independent and supervision  with PT, modified  independent and supervision with OT, n/a with SLP. 8. Estimated rehab length of stay to reach the above functional goals is: 9-14 days 9. Anticipated D/C setting: Home 10. Anticipated post D/C treatments: [place] therapy 11. Overall Rehab/Functional Prognosis: excellent  RECOMMENDATIONS: This patient's condition is appropriate for continued rehabilitative care in the following setting: CIR Patient has agreed to participate in recommended program. Potentially Note that insurance prior authorization may be required for reimbursement for recommended care.  Comment: Rehab Admissions Coordinator to follow up.  Thanks,  Meredith Staggers, MD, Mellody Drown    Meredith Staggers, MD 12/25/2017

## 2017-12-26 ENCOUNTER — Inpatient Hospital Stay (HOSPITAL_COMMUNITY): Payer: Medicare Other

## 2017-12-26 LAB — CBC
HCT: 25 % — ABNORMAL LOW (ref 36.0–46.0)
Hemoglobin: 7.9 g/dL — ABNORMAL LOW (ref 12.0–15.0)
MCH: 29.7 pg (ref 26.0–34.0)
MCHC: 31.6 g/dL (ref 30.0–36.0)
MCV: 94 fL (ref 78.0–100.0)
PLATELETS: 249 10*3/uL (ref 150–400)
RBC: 2.66 MIL/uL — ABNORMAL LOW (ref 3.87–5.11)
RDW: 16.6 % — AB (ref 11.5–15.5)
WBC: 6.4 10*3/uL (ref 4.0–10.5)

## 2017-12-26 LAB — RENAL FUNCTION PANEL
ALBUMIN: 2.1 g/dL — AB (ref 3.5–5.0)
Albumin: 2.2 g/dL — ABNORMAL LOW (ref 3.5–5.0)
Anion gap: 10 (ref 5–15)
Anion gap: 9 (ref 5–15)
BUN: 64 mg/dL — ABNORMAL HIGH (ref 6–20)
BUN: 64 mg/dL — AB (ref 6–20)
CALCIUM: 8.8 mg/dL — AB (ref 8.9–10.3)
CALCIUM: 8.5 mg/dL — AB (ref 8.9–10.3)
CHLORIDE: 95 mmol/L — AB (ref 101–111)
CO2: 28 mmol/L (ref 22–32)
CO2: 26 mmol/L (ref 22–32)
CREATININE: 4.06 mg/dL — AB (ref 0.44–1.00)
CREATININE: 4.16 mg/dL — AB (ref 0.44–1.00)
Chloride: 97 mmol/L — ABNORMAL LOW (ref 101–111)
GFR calc Af Amer: 11 mL/min — ABNORMAL LOW (ref >60)
GFR calc non Af Amer: 10 mL/min — ABNORMAL LOW (ref >60)
GFR, EST AFRICAN AMERICAN: 12 mL/min — AB (ref >60)
GFR, EST NON AFRICAN AMERICAN: 10 mL/min — AB (ref >60)
GLUCOSE: 82 mg/dL (ref 65–99)
Glucose, Bld: 82 mg/dL (ref 65–99)
PHOSPHORUS: 2.3 mg/dL — AB (ref 2.5–4.6)
POTASSIUM: 4.1 mmol/L (ref 3.5–5.1)
Phosphorus: 2.2 mg/dL — ABNORMAL LOW (ref 2.5–4.6)
Potassium: 4.4 mmol/L (ref 3.5–5.1)
SODIUM: 133 mmol/L — AB (ref 135–145)
SODIUM: 132 mmol/L — AB (ref 135–145)

## 2017-12-26 LAB — C3 COMPLEMENT: C3 Complement: 118 mg/dL (ref 82–167)

## 2017-12-26 LAB — ANTI-DNA ANTIBODY, DOUBLE-STRANDED: ds DNA Ab: 1 IU/mL (ref 0–9)

## 2017-12-26 LAB — GLUCOSE, CAPILLARY
GLUCOSE-CAPILLARY: 111 mg/dL — AB (ref 65–99)
GLUCOSE-CAPILLARY: 166 mg/dL — AB (ref 65–99)
GLUCOSE-CAPILLARY: 207 mg/dL — AB (ref 65–99)

## 2017-12-26 LAB — C4 COMPLEMENT: COMPLEMENT C4, BODY FLUID: 66 mg/dL — AB (ref 14–44)

## 2017-12-26 NOTE — Progress Notes (Addendum)
PTA, pt was independent with ADL and functional mobility. She currently presents with decreased cognition, decreased stability on her feet, and generalized weakness impacting her ability to participate in ADL at [facility]. Pt currently requires min assist for ambulating toilet transfers and min assist for standing grooming tasks. She demonstrates good potential and motivation to return to PLOF and would benefit from CIR level therapies post-acute D/C. Pt would benefit from continued OT services while admitted to improve independence and safety with ADL and functional mobility. OT will continue to follow while admitted.    12/26/17 1400  OT Visit Information  Last OT Received On 12/26/17  Assistance Needed +1  History of Present Illness 72 y.o. female with cellulitis on RLE was admitted, had abd aortogram after admission.  Her recent admission was also for hallucinations.    Precautions  Precautions Fall  Restrictions  Weight Bearing Restrictions No  Home Living  Family/patient expects to be discharged to: Private residence  Living Arrangements Spouse/significant other  Available Help at Discharge Family  Type of [place] to enter  Entrance Stairs-Number of Steps 3  Entrance Stairs-Rails Right  Home Layout Two level  [place] - single point;Walker - 2 wheels;Wheelchair - manual;Shower seat;BSC  Prior Function  Level of Independence Independent  Pain Assessment  Pain Assessment No/denies pain  Cognition  Arousal/Alertness Awake/alert  Behavior During Therapy WFL for tasks assessed/performed  Overall Cognitive Status Impaired/Different from baseline  Area of Impairment Problem solving;Awareness  Awareness Emergent  Problem Solving Slow processing  General Comments Pt with noted slow processing at times.   Upper Extremity Assessment  Upper Extremity Assessment Generalized weakness  Lower Extremity  Assessment  Lower Extremity Assessment Generalized weakness  ADL  Overall ADL's  Needs assistance/impaired  Eating/Feeding Sitting;Supervision/ safety  Grooming Minimal assistance;Standing  Upper Body Bathing Min guard;Sitting  Lower Body Bathing Minimal assistance;Sit to/from stand  Upper Body Dressing  Min guard;Sitting  Lower Body Dressing Minimal assistance;Sit to/from Retail buyer Minimal assistance;Ambulation;RW  Toilet Transfer Details (indicate cue type and reason) Cues and assist to manage RW.   Toileting- Clothing Manipulation and Hygiene Minimal assistance;Sit to/from stand  Functional mobility during ADLs Minimal assistance;Rolling walker  General ADL Comments Min assist for stability during ambulation.   Vision- History  Baseline Vision/History Glaucoma  Vision- Assessment  Vision Assessment? Vision impaired- to be further tested in functional context  Additional Comments Noted dysconjugate gaze. Will continue to follow.   Bed Mobility  Overal bed mobility Needs Assistance  Bed Mobility Supine to Sit;Sit to Supine  Supine to sit Supervision  Sit to supine Supervision  General bed mobility comments Cues for safety.   Transfers  Overall transfer level Needs assistance  Equipment used Rolling walker (2 wheeled)  Transfers Sit to/from Stand  Sit to Stand Min assist  General transfer comment Cues for hand placement to and from seated surface.  Pt with poor balance in standing and LOB laterally when turning.  Assist to boost into standing with heavy reliance on RW to regain and maintain balance.    Balance  Overall balance assessment Needs assistance  Sitting-balance support Bilateral upper extremity supported;Feet supported  Sitting balance-Leahy Scale Good  Standing balance support Bilateral upper extremity supported;No upper extremity supported  Standing balance-Leahy Scale Poor  Standing balance comment Decreased stability requiring min assist to maintain  stability.   OT - End of Session  Equipment Utilized During Treatment  Gait belt;Rolling walker  Activity Tolerance Patient tolerated treatment well  Patient left in bed;with call bell/phone within reach;with family/visitor present  Nurse Communication Mobility status  OT Assessment  OT Recommendation/Assessment Patient needs continued OT Services  OT Visit Diagnosis Other abnormalities of gait and mobility (R26.89);Muscle weakness (generalized) (M62.81)  OT Problem List Decreased strength;Decreased activity tolerance;Impaired balance (sitting and/or standing);Decreased safety awareness;Decreased knowledge of use of DME or AE;Decreased cognition;Decreased knowledge of precautions  OT Plan  OT Frequency (ACUTE ONLY) Min 2X/week  OT Treatment/Interventions (ACUTE ONLY) Self-care/ADL training;Therapeutic exercise;Energy conservation;DME and/or AE instruction;Therapeutic activities;Patient/family education;Balance training;Visual/perceptual remediation/compensation  AM-PAC OT "6 Clicks" Daily Activity Outcome Measure  Help from another person eating meals? 3  Help from another person taking care of personal grooming? 3  Help from another person toileting, which includes using toliet, bedpan, or urinal? 3  Help from another person bathing (including washing, rinsing, drying)? 3  Help from another person to put on and taking off regular upper body clothing? 3  Help from another person to put on and taking off regular lower body clothing? 3  6 Click Score 18  ADL G Code Conversion CK  OT Recommendation  Recommendations for Other Services Rehab consult  Follow Up Recommendations CIR;Supervision/Assistance - 24 hour  OT Equipment None recommended by OT  Individuals Consulted  Consulted and Agree with Results and Recommendations Patient;Family member/caregiver  Family Member Consulted husband  Acute Rehab OT Goals  Patient Stated Goal to go to rehab  OT Goal Formulation With patient/family  Time  For Goal Achievement 01/09/18  Potential to Achieve Goals Good  OT Time Calculation  OT Start Time (ACUTE ONLY) 1445  OT Stop Time (ACUTE ONLY) 1505  OT Time Calculation (min) 20 min  OT General Charges  $OT Visit 1 Visit  OT Evaluation  $OT Eval Moderate Complexity 1 Mod  Written Expression  Dominant Hand Right   Norman Herrlich, MS OTR/L  Pager: [phone]

## 2017-12-26 NOTE — Progress Notes (Signed)
OT Cancellation Note  Patient Details Name: [name] MRN: [id number] DOB: [birth year]   Cancelled Treatment:    Reason Eval/Treat Not Completed: Patient at procedure or test/ unavailable. Pt at HD at this time. Will check back as able.   Norman Herrlich, MS OTR/L  Pager: [place] 12/26/2017, 9:12 AM

## 2017-12-26 NOTE — Procedures (Signed)
Patient seen and examined on Hemodialysis. QB 400 mL/ min, UF goal 1.5L   Tolerating rx well.  Treatment adjusted as needed.  Madelon Lips MD [facility] pgr [phone] 8:26 AM

## 2017-12-26 NOTE — Progress Notes (Signed)
[facility] Progress Note   Subjective:  Tells me she's not having hallucinations anymore.  Doing OK.    Objective Vitals:   12/25/17 2135 12/26/17 0530 12/26/17 0745 12/26/17 0753  BP: (!) 160/60 (!) 155/60 (!) 170/40 (!) 153/44  Pulse: 68 67 72 68  Resp: 18 18 16 17   Temp: 98.1 F (36.7 C) 97.9 F (36.6 C) 97.6 F (36.4 C) 97.7 F (36.5 C)  TempSrc: Oral Oral Oral Oral  SpO2: 97% 98%  98%  Weight:  75.9 kg (167 lb 6.4 oz)  77 kg (169 lb 12.1 oz)  Height:       Physical Exam General: pleasant elderly female in NAD Heart: S1,S2 RRR No JVD.  Lungs: CTAB Abdomen: Active BS Extremities: No LE edema.R posterior calf ulcer, exquisitely tender to touch. Eschar in wound bed. Blood blister R toe.  Dialysis Access: LUA AVF + bruit. RIJ TDC Drsg CDI.    Additional Objective Labs: Basic Metabolic Panel: Recent Labs  Lab 12/24/17 0424 12/25/17 0602 12/26/17 0643  NA 135 135 133*  K 3.9 4.0 4.4  CL 98* 98* 95*  CO2 27 28 28   GLUCOSE 100* 113* 82  BUN 59* 31* 64*  CREATININE 4.81* 2.92* 4.06*  CALCIUM 8.7* 8.4* 8.8*  PHOS 2.8 1.8* 2.2*   Liver Function Tests: Recent Labs  Lab 12/24/17 0424 12/25/17 0602 12/26/17 0643  ALBUMIN 2.2* 2.2* 2.2*   No results for input(s): LIPASE, AMYLASE in the last 168 hours. CBC: Recent Labs  Lab 12/20/17 0640 12/22/17 0240  12/24/17 0916 12/25/17 0636 12/26/17 0752  WBC 8.1 9.1  --  8.5 7.5 6.4  HGB 8.6* 8.1*  --  7.2* 8.0* 7.9*  HCT 28.4* 26.2*   < > 24.3* 26.2* 25.0*  MCV 93.4 90.3  --  93.1 94.6 94.0  PLT 190 219  --  235 235 249   < > = values in this interval not displayed.   Blood Culture    Component Value Date/Time   SDES BLOOD RIGHT HAND 12/14/2017 1631   SPECREQUEST  12/14/2017 1631    BOTTLES DRAWN AEROBIC AND ANAEROBIC Blood Culture adequate volume   CULT  12/14/2017 1631    NO GROWTH 5 DAYS Performed at [facility], [address] [place], [place] [address]    REPTSTATUS 12/19/2017 FINAL  12/14/2017 1631    Cardiac Enzymes: No results for input(s): CKTOTAL, CKMB, CKMBINDEX, TROPONINI in the last 168 hours. CBG: Recent Labs  Lab 12/24/17 2136 12/25/17 0801 12/25/17 1224 12/25/17 1605 12/25/17 2140  GLUCAP 210* 76 96 177* 135*   Iron Studies: No results for input(s): IRON, TIBC, TRANSFERRIN, FERRITIN in the last 72 hours. @lablastinr3 @ Studies/Results: Mr Brain Wo Contrast  Result Date: 12/26/2017 CLINICAL DATA:  72 y/o  F; hallucinations, history of lupus. EXAM: MRI HEAD WITHOUT CONTRAST TECHNIQUE: Multiplanar, multiecho pulse sequences of the brain and surrounding structures were obtained without intravenous contrast. COMPARISON:  None. FINDINGS: Brain: No acute infarction, hemorrhage, hydrocephalus, extra-axial collection or mass lesion. Small chronic lacunar infarcts within bilateral thalamus and right pons. Fewnonspecific foci of T2 FLAIR hyperintense signal abnormality in subcortical and periventricular white matter are compatible withmildchronic microvascular ischemic changes for age. Moderate diffuse supratentorial and infratentorialbrain parenchymal volume loss. Nonspecific punctate foci of susceptibility hypointensity are present in the left putamen and left frontal white matter compatible with hemosiderin deposition of chronic microhemorrhage. Vascular: Normal flow voids. Skull and upper cervical spine: Normal marrow signal. Sinuses/Orbits: Mild ethmoid and maxillary sinus mucosal  thickening. No abnormal signal of mastoid air cells. Orbits are unremarkable. Other: None. IMPRESSION: 1. No acute process or structural abnormality of the brain identified. 2. Mild chronic microvascular ischemic changes and moderate diffuse parenchymal volume loss of the brain. Small chronic lacunar infarcts in basal ganglia and right pons. No specific findings of vasculitis. Electronically Signed   By: Kristine Garbe M.D.   On: 12/26/2017 01:10   Medications: . sodium chloride     . ferric gluconate (FERRLECIT/NULECIT) IV Stopped (12/24/17 1649)  . sodium thiosulfate infusion for calciphylaxis     . apixaban  5 mg Oral BID  . atorvastatin  20 mg Oral Daily  . timolol  1 drop Left Eye Daily   And  . brimonidine  1 drop Left Eye Daily  . darbepoetin (ARANESP) injection - DIALYSIS  200 mcg Intravenous Q Thu-HD  . febuxostat  40 mg Oral Daily  . feeding supplement (NEPRO CARB STEADY)  237 mL Oral Q24H  . feeding supplement (PRO-STAT SUGAR FREE 64)  30 mL Oral BID  . folic acid  1 mg Oral Daily  . hydroxychloroquine  300 mg Oral Daily  . insulin aspart  0-9 Units Subcutaneous TID WC  . ketorolac  1 drop Left Eye QID  . latanoprost  1 drop Left Eye QHS  . mouth rinse  15 mL Mouth Rinse BID  . metoprolol tartrate  25 mg Oral Q T,Th,Sat-1800  . metoprolol tartrate  50 mg Oral 2 times per day on Sun Mon Wed Fri  . nutrition supplement (JUVEN)  1 packet Oral BID BM  . polyethylene glycol  17 g Oral BID  . predniSONE  5 mg Oral Q breakfast  . sodium chloride flush  3 mL Intravenous Q12H     Dialysis Orders: TTS 4 hr Reids 400/800 3 K 2.5 Ca EDW 78kg right IJ TDC std Heparin, calcitriol 0.25 Mircera 50 last 3/5  Assessment/Plan: 1. RLE calf wound: arteriogram neg for arterial or venous cause of skin ulcer. Possible calciphylaxis or warfarin skin necrosis. Have dc'd Ca++containing and vit D meds and started Na thio with HD.S/p deep tissue biopsy 12/21/2017- unfortunately there wasn't adequate subcutaneous tissue in the biopsy so she had second biopsy 12/24/17 for definitive diagnosis appreciate gen surg, results pending.  After biopsy done, please let pathology know to send the sample to derm pathologyand that we are looking for calciphylaxis and/or warfarin skin necrosis. Was being treated with Sodium Thiosulfate but this was stopped by primary D/T hallucinations. No experience in OP clinic or hospitals with use of this drug causing hallucinations-have discussed with  pharmacy and will continue.  2. ESRD: TTS HD. Has L 1st stage BVT placed in 2015. Seen by [facility], holding on further surgery until leg lesion diagnosis is made. Next HD 12/26/17  3. Hypertension/volume: HD 12/25/17 Pre wt 77 kg Net UF 1 liter Post wt 75 kg (?). Concerned about accuracy of wts. Does not appear volume overloaded by exam 0.5-1 liter UFG tomorrow. Lower EDW on DC.  4. Anemia: Hgb 8.1, Aranesp 100 given3/20 off schedule, increase dose to 261mcg weekly starting 3/28. +heme stools 2/23 during ED visit, no issues recently. 5. Metabolic bone disease: Phos low. CA 8.4. Given poss calciphylaxis -> d/c'd phoslo/ vit D and calcitriol. 6. Nutrition: Alb very low (2.2), continue Nepro/Pro-stat/Juven pwdr. 7. Afib- on chronic coumadin/MTP---> Coumadin stopped for poss calciphylaxis, now on Eliquis. 8. SLE: On Plaquenil. 9. DM: On insulin, per primary 10. Dispo: pending- being evaluated for  CIR     Madelon Lips MD 12/26/2017, 8:22 AM  [employer] [phone]

## 2017-12-26 NOTE — Progress Notes (Signed)
PROGRESS NOTE    [name]  OEH:[id number] DOB: [birth year] DOA: 12/14/2017 PCP: Sharilyn Sites, MD      Brief Narrative:  This is [name] is a 72 year old female with ESRD, COPD, A. fib, lupus, diabetes, HTN, DVT who presents with right leg ulcer.  She was started on empiric antibiotics, without improvement.  Vascular surgery was consulted, but duplex ultrasounds of the leg showed no evidence of stenosis.    Biopsy was obtained 3/25, not definitive.  Second one obtained 3/28.   Assessment & Plan:  Right leg ulcer Possible calciphylaxis versus warfarin skin necrosis.  Biopsy today shows nonspecific findings, no help in diagnosis.  Other considerations could be pyoderma gangrenosum or lupus. -Acetaminophen for pain -Sodium thiosulfate per nephrology -Hold calcium supplements, iron -Follow biopsy   Hallucinations Delusions are resolved.  Likely this was oxycodone, agree with nephrology.  MRI brain unremarkable.  Complements and anti-dsDNA not consistent with lupus flare. -Hold opiates -PT, OT   Atrial fibrillation, paroxysmal Chads 2 Vasc 4.  Rate controlled.  Anticoagulated previously with warfarin. -Continue apixaban, metoprolol  ESRD -Consulted nephrology for HD, appreciate cares  Lupus -Continue home prednisone, Plaquenil  Diabetes Well controlled -Blood sugars very low even with sparse SSI -Hold Levemir -Continue SSI  COPD No evidence of disease  Chronic constipation -Continue MiraLAX twice daily  Other medications -Continue statin -Continue eye drops -Continue Uloric -Continue dicyclomine   RLQ tenderness This is a new complaint to me, however per her husband, this is a chronic thing, she has had for noncontrasted CT abdomens in the first 4 months of this year.  No further work-up.  This is probably a panniculitis.     DVT prophylaxis: N/A on apixaban Code Status: FULL Family Communication: Husband at bedside MDM and disposition Plan:  The below labs and imaging reports were reviewed.  Her hallucinations have resolved.  PT have recommended CIR, we will continue to rehab the patient until insurance authorization has been resolved.  The patient continues to require 1 person assist for transfers and ambulation, which is significantly different from her baseline.  However she appears to be making progress with physical therapy, and has good motivation.     Consultants:   Nephrology     Subjective: No more hallucinations.  Her right leg still hurts.  She has no new fever, cough, redness or swelling around the wound     Resting in bed, eating lunch. Objective: Vitals:   12/26/17 1145 12/26/17 1154 12/26/17 1329 12/26/17 1555  BP: (!) 155/65  (!) 137/55 (!) 131/45  Pulse: 77  80 86  Resp:   18 17  Temp:   98.3 F (36.8 C) 98.3 F (36.8 C)  TempSrc:   Oral Oral  SpO2:   100% 97%  Weight:  76 kg (167 lb 8.8 oz)    Height:        Intake/Output Summary (Last 24 hours) at 12/26/2017 1722 Last data filed at 12/26/2017 1154 Gross per 24 hour  Intake -  Output 1000 ml  Net -1000 ml   Filed Weights   12/26/17 0530 12/26/17 0753 12/26/17 1154  Weight: 75.9 kg (167 lb 6.4 oz) 77 kg (169 lb 12.1 oz) 76 kg (167 lb 8.8 oz)    Examination: General appearance: Relating with a walker with physical therapy HEENT: Exotropia, dentures, OP moist, no lesions.  Amblyopia unchanged, dentures in place, OP is moist, no lesions. Skin: Bandage over wound    Cardiac: RRR, no murmurs, no lower extremity  edema. Respiratory: Effort normal, no rales or wheezes. Abdomen: Abdomen soft, no tenderness to palpation, although she has some tenderness in the right lower pannus, seems like a panniculitis. Psych: Normal hallucinations.  She is oriented to person place and time and situation.  Judgment and insight appear normal.       Data Reviewed: I have personally reviewed following labs and imaging studies:  CBC: Recent Labs  Lab  12/26/2017 0640 12/22/17 0240 12/23/17 1527 12/24/17 0916 12/25/17 0636 12/26/17 0752  WBC 8.1 9.1  --  8.5 7.5 6.4  HGB 8.6* 8.1*  --  7.2* 8.0* 7.9*  HCT 28.4* 26.2* 24.9* 24.3* 26.2* 25.0*  MCV 93.4 90.3  --  93.1 94.6 94.0  PLT 190 219  --  235 235 161   Basic Metabolic Panel: Recent Labs  Lab 12/23/17 0556 12/24/17 0424 12/25/17 0602 12/26/17 0643 12/26/17 0756  NA 135 135 135 133* 132*  K 3.6 3.9 4.0 4.4 4.1  CL 96* 98* 98* 95* 97*  CO2 28 27 28 28 26   GLUCOSE 103* 100* 113* 82 82  BUN 26* 59* 31* 64* 64*  CREATININE 3.13* 4.81* 2.92* 4.06* 4.16*  CALCIUM 8.4* 8.7* 8.4* 8.8* 8.5*  PHOS 2.3* 2.8 1.8* 2.2* 2.3*   GFR: Estimated Creatinine Clearance: 12.5 mL/min (A) (by C-G formula based on SCr of 4.16 mg/dL (H)). Liver Function Tests: Recent Labs  Lab 12/23/17 0556 12/24/17 0424 12/25/17 0602 12/26/17 0643 12/26/17 0756  ALBUMIN 2.2* 2.2* 2.2* 2.2* 2.1*   No results for input(s): LIPASE, AMYLASE in the last 168 hours. Recent Labs  Lab 12/23/17 1527  AMMONIA 18   Coagulation Profile: Recent Labs  Lab 26-Dec-2017 0640  INR 1.19   Cardiac Enzymes: No results for input(s): CKTOTAL, CKMB, CKMBINDEX, TROPONINI in the last 168 hours. BNP (last 3 results) No results for input(s): PROBNP in the last 8760 hours. HbA1C: No results for input(s): HGBA1C in the last 72 hours. CBG: Recent Labs  Lab 12/25/17 0801 12/25/17 1224 12/25/17 1605 12/25/17 12-27-38 12/26/17 1313  GLUCAP 76 96 177* 135* 111*   Lipid Profile: No results for input(s): CHOL, HDL, LDLCALC, TRIG, CHOLHDL, LDLDIRECT in the last 72 hours. Thyroid Function Tests: No results for input(s): TSH, T4TOTAL, FREET4, T3FREE, THYROIDAB in the last 72 hours. Anemia Panel: No results for input(s): VITAMINB12, FOLATE, FERRITIN, TIBC, IRON, RETICCTPCT in the last 72 hours. Urine analysis:    Component Value Date/Time   COLORURINE AMBER (A) 11/19/2017 0300   APPEARANCEUR HAZY (A) 11/19/2017 0300    LABSPEC 1.021 11/19/2017 0300   PHURINE 5.0 11/19/2017 0300   GLUCOSEU NEGATIVE 11/19/2017 0300   HGBUR NEGATIVE 11/19/2017 0300   BILIRUBINUR NEGATIVE 11/19/2017 0300   KETONESUR NEGATIVE 11/19/2017 0300   PROTEINUR NEGATIVE 11/19/2017 0300   UROBILINOGEN 1.0 03/17/2014 1830   NITRITE NEGATIVE 11/19/2017 0300   LEUKOCYTESUR MODERATE (A) 11/19/2017 0300   Sepsis Labs: @LABRCNTIP (procalcitonin:4,lacticacidven:4)  ) Recent Results (from the past 240 hour(s))  Surgical pcr screen     Status: None   Collection Time: 12/17/17  7:49 PM  Result Value Ref Range Status   MRSA, PCR NEGATIVE NEGATIVE Final   Staphylococcus aureus NEGATIVE NEGATIVE Final    Comment: (NOTE) The Xpert SA Assay (FDA approved for NASAL specimens in patients 63 years of age and older), is one component of a comprehensive surveillance program. It is not intended to diagnose infection nor to guide or monitor treatment. Performed at [facility], [place],  [place] [address]          Radiology Studies: Mr Brain Wo Contrast  Result Date: 12/26/2017 CLINICAL DATA:  72 y/o  F; hallucinations, history of lupus. EXAM: MRI HEAD WITHOUT CONTRAST TECHNIQUE: Multiplanar, multiecho pulse sequences of the brain and surrounding structures were obtained without intravenous contrast. COMPARISON:  None. FINDINGS: Brain: No acute infarction, hemorrhage, hydrocephalus, extra-axial collection or mass lesion. Small chronic lacunar infarcts within bilateral thalamus and right pons. Fewnonspecific foci of T2 FLAIR hyperintense signal abnormality in subcortical and periventricular white matter are compatible withmildchronic microvascular ischemic changes for age. Moderate diffuse supratentorial and infratentorialbrain parenchymal volume loss. Nonspecific punctate foci of susceptibility hypointensity are present in the left putamen and left frontal white matter compatible with hemosiderin deposition of chronic  microhemorrhage. Vascular: Normal flow voids. Skull and upper cervical spine: Normal marrow signal. Sinuses/Orbits: Mild ethmoid and maxillary sinus mucosal thickening. No abnormal signal of mastoid air cells. Orbits are unremarkable. Other: None. IMPRESSION: 1. No acute process or structural abnormality of the brain identified. 2. Mild chronic microvascular ischemic changes and moderate diffuse parenchymal volume loss of the brain. Small chronic lacunar infarcts in basal ganglia and right pons. No specific findings of vasculitis. Electronically Signed   By: Kristine Garbe M.D.   On: 12/26/2017 01:10        Scheduled Meds: . apixaban  5 mg Oral BID  . atorvastatin  20 mg Oral Daily  . timolol  1 drop Left Eye Daily   And  . brimonidine  1 drop Left Eye Daily  . darbepoetin (ARANESP) injection - DIALYSIS  200 mcg Intravenous Q Thu-HD  . febuxostat  40 mg Oral Daily  . feeding supplement (NEPRO CARB STEADY)  237 mL Oral Q24H  . feeding supplement (PRO-STAT SUGAR FREE 64)  30 mL Oral BID  . folic acid  1 mg Oral Daily  . hydroxychloroquine  300 mg Oral Daily  . insulin aspart  0-9 Units Subcutaneous TID WC  . ketorolac  1 drop Left Eye QID  . latanoprost  1 drop Left Eye QHS  . mouth rinse  15 mL Mouth Rinse BID  . metoprolol tartrate  25 mg Oral Q T,Th,Sat-1800  . metoprolol tartrate  50 mg Oral 2 times per day on Sun Mon Wed Fri  . nutrition supplement (JUVEN)  1 packet Oral BID BM  . polyethylene glycol  17 g Oral BID  . predniSONE  5 mg Oral Q breakfast  . sodium chloride flush  3 mL Intravenous Q12H   Continuous Infusions: . sodium chloride    . ferric gluconate (FERRLECIT/NULECIT) IV Stopped (12/24/17 1649)  . sodium thiosulfate infusion for calciphylaxis Stopped (12/26/17 1620)     LOS: 12 days    Time spent: 25 minutes    Edwin Dada, MD [facility] 12/26/2017, 5:22 PM     Pager [phone] --- please page though AMION:   [email or web address] If 7PM-7AM, please contact night-coverage

## 2017-12-26 NOTE — Progress Notes (Signed)
This RN noticed pt having increase in coughing with drinking liquids and when taking pills this afternoon Dr Loleta Books notified will continue to monitor

## 2017-12-27 LAB — RENAL FUNCTION PANEL
ANION GAP: 9 (ref 5–15)
Albumin: 2.2 g/dL — ABNORMAL LOW (ref 3.5–5.0)
BUN: 30 mg/dL — ABNORMAL HIGH (ref 6–20)
CALCIUM: 8.5 mg/dL — AB (ref 8.9–10.3)
CO2: 27 mmol/L (ref 22–32)
Chloride: 100 mmol/L — ABNORMAL LOW (ref 101–111)
Creatinine, Ser: 3.18 mg/dL — ABNORMAL HIGH (ref 0.44–1.00)
GFR calc non Af Amer: 14 mL/min — ABNORMAL LOW (ref >60)
GFR, EST AFRICAN AMERICAN: 16 mL/min — AB (ref >60)
Glucose, Bld: 139 mg/dL — ABNORMAL HIGH (ref 65–99)
PHOSPHORUS: 1.5 mg/dL — AB (ref 2.5–4.6)
POTASSIUM: 3.7 mmol/L (ref 3.5–5.1)
SODIUM: 136 mmol/L (ref 135–145)

## 2017-12-27 LAB — GLUCOSE, CAPILLARY
GLUCOSE-CAPILLARY: 181 mg/dL — AB (ref 65–99)
GLUCOSE-CAPILLARY: 266 mg/dL — AB (ref 65–99)
GLUCOSE-CAPILLARY: 139 mg/dL — AB (ref 65–99)
Glucose-Capillary: 95 mg/dL (ref 65–99)

## 2017-12-27 NOTE — Plan of Care (Signed)
Progressing

## 2017-12-27 NOTE — Progress Notes (Signed)
PROGRESS NOTE    [name]  JHE:[id number] DOB: [birth year] DOA: 12/14/2017 PCP: Sharilyn Sites, MD      Brief Narrative:  This is [name] is a 72 year old female with ESRD, COPD, A. fib, lupus, diabetes, HTN, DVT who presents with right leg ulcer.  She was started on empiric antibiotics, without improvement.  Vascular surgery was consulted, but duplex ultrasounds of the leg showed no evidence of stenosis.    Biopsy was obtained 3/25, not definitive.  Second one obtained 3/28.   Assessment & Plan:  Right leg ulcer Possible calciphylaxis versus warfarin skin necrosis.  Biopsy today shows nonspecific findings, no help in diagnosis.  Other considerations could be pyoderma gangrenosum or lupus although this can less likely.  It is not actually that painful. -Continue acetaminophen -Continue sodium thiosulfate - Calcium supplements, iron -Follow biopsy     Hallucinations Hallucinations resolved.  Nephrology was correct this with oxycodone.  MRI brain unremarkable, ammonia, TSH, B12, complements, anti-dsDNA essentially within normal limits.    -No opiates -PT, OT   Atrial fibrillation, paroxysmal Chads 2 Vasc 4.  Rate controlled.  Anticoagulated previously with warfarin. -No change to apixaban, metoprolol  ESRD -Consulted nephrology for HD, appreciate excellent cares  Lupus -Continue prednisone, Plaquenil  Diabetes Well controlled. Blood sugars very low even with sparse SSI -Hold Levemir -Continue SSI  COPD No evidence of disease  Chronic constipation -Continue MiraLAX twice daily  Other medications -Continue statin -Continue eye drops -Continue Uloric -Continue dicyclomine         DVT prophylaxis: N/A on apixaban Code Status: FULL Family Communication: Husband at bedside MDM and disposition Plan: The laboratory reports and imaging below were reviewed.  Her hallucinations are still resolved.  PT recommending CIR and we will rehab until insurance  authorization has been resolved.  She continues to require 1 person assist for transfers and ambulation, which is significantly different from her baseline.  However she appears to be making progress with physical therapy and has good motivation        Consultants:   Nephrology     Subjective: Oriented to person place and time.  No more hallucinations.  Right leg no pain.  Her pannus is still somewhat uncomfortable.  This is actually her main complaint.  No fever, cough, redness or swelling around her right leg wound, difference confusion.        Resting in bed, eating lunch. Objective: Vitals:   12/26/17 2230 12/27/17 0629 12/27/17 0825 12/27/17 1437  BP: (!) 158/40 (!) 182/50  (!) 157/53  Pulse: 76 73 74 75  Resp:  20  17  Temp:  98 F (36.7 C)  97.7 F (36.5 C)  TempSrc:    Oral  SpO2:  100%  100%  Weight:      Height:        Intake/Output Summary (Last 24 hours) at 12/27/2017 1444 Last data filed at 12/27/2017 0616 Gross per 24 hour  Intake 120 ml  Output -  Net 120 ml   Filed Weights   12/26/17 0530 12/26/17 0753 12/26/17 1154  Weight: 75.9 kg (167 lb 6.4 oz) 77 kg (169 lb 12.1 oz) 76 kg (167 lb 8.8 oz)    Examination: General appearance: Elderly female, lying in bed, interactive, no acute distress HEENT: Change to exotropia.she has dentures in place, otherwise the OP is moist without lesions.    Skin: There is no tenderness, swelling, redness, or drainage around her right leg wound. Cardiac: Heart rate and rhythm regular,  no murmurs, no lower extremity edema. Respiratory: Respiratory effort normal without wheezes or rales. Abdomen: Abdomen soft, no tenderness to palpation, although she has some tenderness in the right lower pannus, seems like a panniculitis. Psych: She is alert and oriented to person place and time.  Her cognitive responses are somewhat slowed, but overall her judgment and insight appear normal for age.  She is having no hallucinations.        Data Reviewed: I have personally reviewed following labs and imaging studies:  CBC: Recent Labs  Lab 12/22/17 0240 12/23/17 1527 12/24/17 0916 12/25/17 0636 12/26/17 0752  WBC 9.1  --  8.5 7.5 6.4  HGB 8.1*  --  7.2* 8.0* 7.9*  HCT 26.2* 24.9* 24.3* 26.2* 25.0*  MCV 90.3  --  93.1 94.6 94.0  PLT 219  --  235 235 793   Basic Metabolic Panel: Recent Labs  Lab 12/24/17 0424 12/25/17 0602 12/26/17 0643 12/26/17 0756 12/27/17 0358  NA 135 135 133* 132* 136  K 3.9 4.0 4.4 4.1 3.7  CL 98* 98* 95* 97* 100*  CO2 27 28 28 26 27   GLUCOSE 100* 113* 82 82 139*  BUN 59* 31* 64* 64* 30*  CREATININE 4.81* 2.92* 4.06* 4.16* 3.18*  CALCIUM 8.7* 8.4* 8.8* 8.5* 8.5*  PHOS 2.8 1.8* 2.2* 2.3* 1.5*   GFR: Estimated Creatinine Clearance: 16.3 mL/min (A) (by C-G formula based on SCr of 3.18 mg/dL (H)). Liver Function Tests: Recent Labs  Lab 12/24/17 0424 12/25/17 0602 12/26/17 0643 12/26/17 0756 12/27/17 0358  ALBUMIN 2.2* 2.2* 2.2* 2.1* 2.2*   No results for input(s): LIPASE, AMYLASE in the last 168 hours. Recent Labs  Lab 12/23/17 1527  AMMONIA 18   Coagulation Profile: No results for input(s): INR, PROTIME in the last 168 hours. Cardiac Enzymes: No results for input(s): CKTOTAL, CKMB, CKMBINDEX, TROPONINI in the last 168 hours. BNP (last 3 results) No results for input(s): PROBNP in the last 8760 hours. HbA1C: No results for input(s): HGBA1C in the last 72 hours. CBG: Recent Labs  Lab 12/26/17 1313 12/26/17 1721 12/26/17 2102 12/27/17 0748 12/27/17 1233  GLUCAP 111* 166* 207* 95 181*   Lipid Profile: No results for input(s): CHOL, HDL, LDLCALC, TRIG, CHOLHDL, LDLDIRECT in the last 72 hours. Thyroid Function Tests: No results for input(s): TSH, T4TOTAL, FREET4, T3FREE, THYROIDAB in the last 72 hours. Anemia Panel: No results for input(s): VITAMINB12, FOLATE, FERRITIN, TIBC, IRON, RETICCTPCT in the last 72 hours. Urine analysis:    Component Value  Date/Time   COLORURINE AMBER (A) 11/19/2017 0300   APPEARANCEUR HAZY (A) 11/19/2017 0300   LABSPEC 1.021 11/19/2017 0300   PHURINE 5.0 11/19/2017 0300   GLUCOSEU NEGATIVE 11/19/2017 0300   HGBUR NEGATIVE 11/19/2017 0300   BILIRUBINUR NEGATIVE 11/19/2017 0300   KETONESUR NEGATIVE 11/19/2017 0300   PROTEINUR NEGATIVE 11/19/2017 0300   UROBILINOGEN 1.0 03/17/2014 1830   NITRITE NEGATIVE 11/19/2017 0300   LEUKOCYTESUR MODERATE (A) 11/19/2017 0300   Sepsis Labs: @LABRCNTIP (procalcitonin:4,lacticacidven:4)  ) Recent Results (from the past 240 hour(s))  Surgical pcr screen     Status: None   Collection Time: 12/17/17  7:49 PM  Result Value Ref Range Status   MRSA, PCR NEGATIVE NEGATIVE Final   Staphylococcus aureus NEGATIVE NEGATIVE Final    Comment: (NOTE) The Xpert SA Assay (FDA approved for NASAL specimens in patients 52 years of age and older), is one component of a comprehensive surveillance program. It is not intended to diagnose infection nor to guide or  monitor treatment. Performed at [facility], [place] [address], [place], [place] [address]          Radiology Studies: Mr Brain 34 Contrast  Result Date: 12/26/2017 CLINICAL DATA:  72 y/o  F; hallucinations, history of lupus. EXAM: MRI HEAD WITHOUT CONTRAST TECHNIQUE: Multiplanar, multiecho pulse sequences of the brain and surrounding structures were obtained without intravenous contrast. COMPARISON:  None. FINDINGS: Brain: No acute infarction, hemorrhage, hydrocephalus, extra-axial collection or mass lesion. Small chronic lacunar infarcts within bilateral thalamus and right pons. Fewnonspecific foci of T2 FLAIR hyperintense signal abnormality in subcortical and periventricular white matter are compatible withmildchronic microvascular ischemic changes for age. Moderate diffuse supratentorial and infratentorialbrain parenchymal volume loss. Nonspecific punctate foci of susceptibility hypointensity are present in the left  putamen and left frontal white matter compatible with hemosiderin deposition of chronic microhemorrhage. Vascular: Normal flow voids. Skull and upper cervical spine: Normal marrow signal. Sinuses/Orbits: Mild ethmoid and maxillary sinus mucosal thickening. No abnormal signal of mastoid air cells. Orbits are unremarkable. Other: None. IMPRESSION: 1. No acute process or structural abnormality of the brain identified. 2. Mild chronic microvascular ischemic changes and moderate diffuse parenchymal volume loss of the brain. Small chronic lacunar infarcts in basal ganglia and right pons. No specific findings of vasculitis. Electronically Signed   By: Kristine Garbe M.D.   On: 12/26/2017 01:10        Scheduled Meds: . apixaban  5 mg Oral BID  . atorvastatin  20 mg Oral Daily  . timolol  1 drop Left Eye Daily   And  . brimonidine  1 drop Left Eye Daily  . darbepoetin (ARANESP) injection - DIALYSIS  200 mcg Intravenous Q Thu-HD  . febuxostat  40 mg Oral Daily  . feeding supplement (NEPRO CARB STEADY)  237 mL Oral Q24H  . feeding supplement (PRO-STAT SUGAR FREE 64)  30 mL Oral BID  . folic acid  1 mg Oral Daily  . hydroxychloroquine  300 mg Oral Daily  . insulin aspart  0-9 Units Subcutaneous TID WC  . ketorolac  1 drop Left Eye QID  . latanoprost  1 drop Left Eye QHS  . mouth rinse  15 mL Mouth Rinse BID  . metoprolol tartrate  25 mg Oral Q T,Th,Sat-1800  . metoprolol tartrate  50 mg Oral 2 times per day on Sun Mon Wed Fri  . nutrition supplement (JUVEN)  1 packet Oral BID BM  . polyethylene glycol  17 g Oral BID  . predniSONE  5 mg Oral Q breakfast  . sodium chloride flush  3 mL Intravenous Q12H   Continuous Infusions: . sodium chloride    . sodium thiosulfate infusion for calciphylaxis Stopped (12/26/17 1620)     LOS: 13 days    Time spent: 15 minutes    Edwin Dada, MD [facility] 12/27/2017, 2:44 PM     Pager [phone] --- please page though  AMION:  [email or web address] Password [email or web address] If 7PM-7AM, please contact night-coverage

## 2017-12-27 NOTE — Progress Notes (Signed)
[facility] Progress Note   Subjective:  Feeling better, ate all of her breakfast this AM.   Objective Vitals:   12/26/17 2103 12/26/17 2230 12/27/17 0629 12/27/17 0825  BP: (!) 170/51 (!) 158/40 (!) 182/50   Pulse: 74 76 73 74  Resp: 18  20   Temp: 98 F (36.7 C)  98 F (36.7 C)   TempSrc:      SpO2: 98%  100%   Weight:      Height:       Physical Exam General: pleasant elderly female in NAD Heart: S1,S2 RRR No JVD.  Lungs: CTAB Abdomen: Active BS, very mild TTP in RLQ. Extremities: No LE edema.R posterior calf ulcer, exquisitely tender to touch. Eschar in wound bed. Blood blister R toe.  Leg wound is dressed today.  Dialysis Access: LUA AVF + bruit. RIJ TDC Drsg CDI.    Additional Objective Labs: Basic Metabolic Panel: Recent Labs  Lab 12/26/17 0643 12/26/17 0756 12/27/17 0358  NA 133* 132* 136  K 4.4 4.1 3.7  CL 95* 97* 100*  CO2 28 26 27   GLUCOSE 82 82 139*  BUN 64* 64* 30*  CREATININE 4.06* 4.16* 3.18*  CALCIUM 8.8* 8.5* 8.5*  PHOS 2.2* 2.3* 1.5*   Liver Function Tests: Recent Labs  Lab 12/26/17 0643 12/26/17 0756 12/27/17 0358  ALBUMIN 2.2* 2.1* 2.2*   No results for input(s): LIPASE, AMYLASE in the last 168 hours. CBC: Recent Labs  Lab 12/22/17 0240  12/24/17 0916 12/25/17 0636 12/26/17 0752  WBC 9.1  --  8.5 7.5 6.4  HGB 8.1*  --  7.2* 8.0* 7.9*  HCT 26.2*   < > 24.3* 26.2* 25.0*  MCV 90.3  --  93.1 94.6 94.0  PLT 219  --  235 235 249   < > = values in this interval not displayed.   Blood Culture    Component Value Date/Time   SDES BLOOD RIGHT HAND 12/14/2017 1631   SPECREQUEST  12/14/2017 1631    BOTTLES DRAWN AEROBIC AND ANAEROBIC Blood Culture adequate volume   CULT  12/14/2017 1631    NO GROWTH 5 DAYS Performed at [facility], [address] [place], [place] [address]    REPTSTATUS 12/19/2017 FINAL 12/14/2017 1631    Cardiac Enzymes: No results for input(s): CKTOTAL, CKMB, CKMBINDEX, TROPONINI in the last  168 hours. CBG: Recent Labs  Lab 12/25/17 2140 12/26/17 1313 12/26/17 1721 12/26/17 2102 12/27/17 0748  GLUCAP 135* 111* 166* 207* 95   Iron Studies: No results for input(s): IRON, TIBC, TRANSFERRIN, FERRITIN in the last 72 hours. @lablastinr3 @ Studies/Results: Mr Brain Wo Contrast  Result Date: 12/26/2017 CLINICAL DATA:  72 y/o  F; hallucinations, history of lupus. EXAM: MRI HEAD WITHOUT CONTRAST TECHNIQUE: Multiplanar, multiecho pulse sequences of the brain and surrounding structures were obtained without intravenous contrast. COMPARISON:  None. FINDINGS: Brain: No acute infarction, hemorrhage, hydrocephalus, extra-axial collection or mass lesion. Small chronic lacunar infarcts within bilateral thalamus and right pons. Fewnonspecific foci of T2 FLAIR hyperintense signal abnormality in subcortical and periventricular white matter are compatible withmildchronic microvascular ischemic changes for age. Moderate diffuse supratentorial and infratentorialbrain parenchymal volume loss. Nonspecific punctate foci of susceptibility hypointensity are present in the left putamen and left frontal white matter compatible with hemosiderin deposition of chronic microhemorrhage. Vascular: Normal flow voids. Skull and upper cervical spine: Normal marrow signal. Sinuses/Orbits: Mild ethmoid and maxillary sinus mucosal thickening. No abnormal signal of mastoid air cells. Orbits are unremarkable. Other: None. IMPRESSION: 1.  No acute process or structural abnormality of the brain identified. 2. Mild chronic microvascular ischemic changes and moderate diffuse parenchymal volume loss of the brain. Small chronic lacunar infarcts in basal ganglia and right pons. No specific findings of vasculitis. Electronically Signed   By: Kristine Garbe M.D.   On: 12/26/2017 01:10   Medications: . sodium chloride    . ferric gluconate (FERRLECIT/NULECIT) IV Stopped (12/24/17 1649)  . sodium thiosulfate infusion for  calciphylaxis Stopped (12/26/17 1620)   . apixaban  5 mg Oral BID  . atorvastatin  20 mg Oral Daily  . timolol  1 drop Left Eye Daily   And  . brimonidine  1 drop Left Eye Daily  . darbepoetin (ARANESP) injection - DIALYSIS  200 mcg Intravenous Q Thu-HD  . febuxostat  40 mg Oral Daily  . feeding supplement (NEPRO CARB STEADY)  237 mL Oral Q24H  . feeding supplement (PRO-STAT SUGAR FREE 64)  30 mL Oral BID  . folic acid  1 mg Oral Daily  . hydroxychloroquine  300 mg Oral Daily  . insulin aspart  0-9 Units Subcutaneous TID WC  . ketorolac  1 drop Left Eye QID  . latanoprost  1 drop Left Eye QHS  . mouth rinse  15 mL Mouth Rinse BID  . metoprolol tartrate  25 mg Oral Q T,Th,Sat-1800  . metoprolol tartrate  50 mg Oral 2 times per day on Sun Mon Wed Fri  . nutrition supplement (JUVEN)  1 packet Oral BID BM  . polyethylene glycol  17 g Oral BID  . predniSONE  5 mg Oral Q breakfast  . sodium chloride flush  3 mL Intravenous Q12H     Dialysis Orders: TTS 4 hr Reids 400/800 3 K 2.5 Ca EDW 78kg right IJ TDC std Heparin, calcitriol 0.25 Mircera 50 last 3/5  Assessment/Plan: 1. RLE calf wound: arteriogram neg for arterial or venous cause of skin ulcer. Possible calciphylaxis or warfarin skin necrosis. Have dc'd Ca++containing and vit D meds and started Na thio with HD.S/p deep tissue biopsy 12/21/2017- unfortunately there wasn't adequate subcutaneous tissue in the biopsy so she had second biopsy 12/24/17 for definitive diagnosis appreciate gen surg, results pending.  After biopsy done, please let pathology know to send the sample to derm pathologyand that we are looking for calciphylaxis and/or warfarin skin necrosis. Was being treated with Sodium Thiosulfate but this was stopped by primary D/T hallucinations. No experience in OP clinic or hospitals with use of this drug causing hallucinations-have discussed with pharmacy and will continue.  2. ESRD: TTS HD. Has L 1st stage BVT placed in  2015. Seen by [facility], holding on further surgery until leg lesion diagnosis is made. Next HD 12/29/2017.  3. Hypertension/volume: HD 12/25/17 Pre wt 77 kg Net UF 1 liter Post wt 75 kg (?). Concerned about accuracy of wts. UFG is aourn 1-1.5L 4. Anemia: Hgb 8.1, Aranesp 100 given3/20 off schedule, increase dose to 27mcg weekly starting 3/28. +heme stools 2/23 during ED visit, no issues recently.  No iron containing products for now. 5. Metabolic bone disease: Phos low. CA 8.4. Given poss calciphylaxis -> d/c'd phoslo/ vit D and calcitriol. 6. Nutrition: Alb very low (2.2), continue Nepro/Pro-stat/Juven pwdr. 7. Afib- on chronic coumadin/MTP---> Coumadin stopped for poss calciphylaxis, now on Eliquis. 8. SLE: On Plaquenil. 9. DM: On insulin, per primary 10. Gout- Uloric and pred 5 mg daily 11. Abd pain: longstanding, CTs have been done and haven't really defined the issue, ? Cutaneous neuropathy  12. Dispo: pending- being evaluated for CIR     Madelon Lips MD 12/27/2017, 10:16 AM  [facility] [phone]

## 2017-12-28 DIAGNOSIS — N186 End stage renal disease: Secondary | ICD-10-CM | POA: Diagnosis not present

## 2017-12-28 DIAGNOSIS — E1129 Type 2 diabetes mellitus with other diabetic kidney complication: Secondary | ICD-10-CM | POA: Diagnosis not present

## 2017-12-28 DIAGNOSIS — Z992 Dependence on renal dialysis: Secondary | ICD-10-CM | POA: Diagnosis not present

## 2017-12-28 LAB — RENAL FUNCTION PANEL
ANION GAP: 11 (ref 5–15)
Albumin: 2.1 g/dL — ABNORMAL LOW (ref 3.5–5.0)
BUN: 60 mg/dL — ABNORMAL HIGH (ref 6–20)
CALCIUM: 8.6 mg/dL — AB (ref 8.9–10.3)
CHLORIDE: 97 mmol/L — AB (ref 101–111)
CO2: 27 mmol/L (ref 22–32)
CREATININE: 4.36 mg/dL — AB (ref 0.44–1.00)
GFR, EST AFRICAN AMERICAN: 11 mL/min — AB (ref >60)
GFR, EST NON AFRICAN AMERICAN: 9 mL/min — AB (ref >60)
Glucose, Bld: 140 mg/dL — ABNORMAL HIGH (ref 65–99)
Phosphorus: 1.3 mg/dL — ABNORMAL LOW (ref 2.5–4.6)
Potassium: 3.3 mmol/L — ABNORMAL LOW (ref 3.5–5.1)
Sodium: 135 mmol/L (ref 135–145)

## 2017-12-28 LAB — GLUCOSE, CAPILLARY
GLUCOSE-CAPILLARY: 122 mg/dL — AB (ref 65–99)
Glucose-Capillary: 156 mg/dL — ABNORMAL HIGH (ref 65–99)
Glucose-Capillary: 181 mg/dL — ABNORMAL HIGH (ref 65–99)
Glucose-Capillary: 164 mg/dL — ABNORMAL HIGH (ref 65–99)

## 2017-12-28 MED ORDER — CLOTRIMAZOLE 2 % VA CREA
1.0000 | TOPICAL_CREAM | Freq: Every day | VAGINAL | Status: DC
  Administered 2017-12-28: 1 via VAGINAL
  Filled 2017-12-28: qty 21

## 2017-12-28 NOTE — Progress Notes (Signed)
Occupational Therapy Treatment Patient Details Name: [name] MRN: [id number] DOB: 06/19/1946 Today's Date: 12/28/2017    History of present illness 72 y.o. female with cellulitis on RLE was admitted, had abd aortogram after admission.  Her recent admission was also for hallucinations.     OT comments  Pt continues to demonstrate poor standing balance and decreased activity tolerance with R LE pain. Performed dressing with set up. Toileted and groomed standing at sink with min assist. Pt will benefit from intensive rehab in CIR.  Follow Up Recommendations  CIR;Supervision/Assistance - 24 hour    Equipment Recommendations  None recommended by OT    Recommendations for Other Services      Precautions / Restrictions Precautions Precautions: Fall Restrictions Weight Bearing Restrictions: No       Mobility Bed Mobility Overal bed mobility: Modified Independent             General bed mobility comments: HOB up, no assist, increased time  Transfers Overall transfer level: Needs assistance Equipment used: Rolling walker (2 wheeled) Transfers: Sit to/from Stand Sit to Stand: Min guard         General transfer comment: cues for hand placement, min guard for safety     Balance Overall balance assessment: Needs assistance   Sitting balance-Leahy Scale: Good     Standing balance support: During functional activity Standing balance-Leahy Scale: Poor Standing balance comment: can release walker in static standing, but leans on sink                           ADL either performed or assessed with clinical judgement   ADL Overall ADL's : Needs assistance/impaired     Grooming: Min guard;Wash/dry hands;Standing;Brushing hair           Upper Body Dressing : Set up;Sitting   Lower Body Dressing: Set up;Bed level   Toilet Transfer: Minimal assistance;Ambulation;RW   Toileting- Clothing Manipulation and Hygiene: Minimal assistance;Sit to/from  stand Toileting - Clothing Manipulation Details (indicate cue type and reason): to manage front opening gown     Functional mobility during ADLs: Minimal assistance;Rolling walker General ADL Comments: pt with posterior LOB x 1 requiring min assist for stability     Vision   Vision Assessment?: No apparent visual deficits Additional Comments: reports vision at baseline, sometimes wears reading glasses   Perception     Praxis      Cognition Arousal/Alertness: Awake/alert Behavior During Therapy: WFL for tasks assessed/performed Overall Cognitive Status: Within Functional Limits for tasks assessed                                          Exercises     Shoulder Instructions       General Comments      Pertinent Vitals/ Pain       Pain Assessment: Faces Faces Pain Scale: Hurts little more Pain Location: R LE Pain Descriptors / Indicators: Aching Pain Intervention(s): Monitored during session;Repositioned  Home Living                                          Prior Functioning/Environment              Frequency  Min 2X/week  Progress Toward Goals  OT Goals(current goals can now be found in the care plan section)  Progress towards OT goals: Progressing toward goals  Acute Rehab OT Goals Patient Stated Goal: to go to rehab OT Goal Formulation: With patient/family Time For Goal Achievement: 01/09/18 Potential to Achieve Goals: Good  Plan Discharge plan remains appropriate    Co-evaluation                 AM-PAC PT "6 Clicks" Daily Activity     Outcome Measure   Help from another person eating meals?: None Help from another person taking care of personal grooming?: A Little Help from another person toileting, which includes using toliet, bedpan, or urinal?: A Little Help from another person bathing (including washing, rinsing, drying)?: A Little Help from another person to put on and taking off regular  upper body clothing?: None Help from another person to put on and taking off regular lower body clothing?: A Little 6 Click Score: 20    End of Session Equipment Utilized During Treatment: Gait belt;Rolling walker  OT Visit Diagnosis: Other abnormalities of gait and mobility (R26.89);Muscle weakness (generalized) (M62.81)   Activity Tolerance Patient limited by fatigue   Patient Left in chair;with call bell/phone within reach;with chair alarm set   Nurse Communication          Time: 3128-1188 OT Time Calculation (min): 16 min  Charges: OT General Charges $OT Visit: 1 Visit OT Treatments $Self Care/Home Management : 8-22 mins  12/28/2017 Nestor Lewandowsky, OTR/L Pager: [phone]   Werner Lean Haze [place] 12/28/2017, 11:42 AM

## 2017-12-28 NOTE — Clinical Social Work Note (Signed)
Clinical Social Work Assessment  Patient Details  Name: [name] MRN: [id number] Date of Birth: 1945/11/07  Date of referral:  12/28/17               Reason for consult:  Facility Placement                Permission sought to share information with:  Facility Sport and exercise psychologist, Family Supports Permission granted to share information::  Yes, Verbal Permission Granted  Name::     Astronomer::  SNFs  Relationship::  Spouse  Contact Information:     Housing/Transportation Living arrangements for the past 2 months:  [place] of Information:  Patient, Spouse Patient Interpreter Needed:  None Criminal Activity/Legal Involvement Pertinent to Current Situation/Hospitalization:  No - Comment as needed Significant Relationships:  Spouse Lives with:  Spouse Do you feel safe going back to the place where you live?  No Need for family participation in patient care:  No (Coment)  Care giving concerns:  CSW received consult for possible SNF placement at time of discharge. Per CIR, patient was denied by insurance and patient's spouse wanted to reconsider SNF. CSW spoke with patient and spouse at bedside. They would like to pursue SNF placement. CSW to continue to follow and assist with discharge planning needs.   Social Worker assessment / plan:  CSW spoke with patient and spouse concerning possibility of rehab at [facility] before returning home.  Employment status:  Retired Nurse, adult PT Recommendations:  [place] / Referral to community resources:  [place]  Patient/Family's Response to care:  CSW informed patient and spouse of cost of copays at SNF and that patient will be billed for them. [facility] (where patient went before) is not able to provide transport to dialysis. [facility] is able to provide transport so patient's spouse would like to pursue that.   Patient/Family's  Understanding of and Emotional Response to Diagnosis, Current Treatment, and Prognosis:  Patient/family is realistic regarding therapy needs and expressed being hopeful for SNF placement. Patient expressed understanding of CSW role and discharge process as well as medical condition. No questions/concerns about plan or treatment.    Emotional Assessment Appearance:  Appears stated age Attitude/Demeanor/Rapport:  Gracious Affect (typically observed):  Accepting, Appropriate Orientation:  Oriented to Self, Oriented to Situation, Oriented to Place, Oriented to  Time Alcohol / Substance use:  Not Applicable Psych involvement (Current and /or in the community):  No (Comment)  Discharge Needs  Concerns to be addressed:  Care Coordination Readmission within the last 30 days:  No Current discharge risk:  None Barriers to Discharge:  Continued Medical Work up   [employer], [place] 12/28/2017, 3:42 PM

## 2017-12-28 NOTE — Care Management Note (Signed)
Case Management Note  Patient Details  Name: [name] MRN: [id number] Date of Birth: 06-Jul-1946  Subjective/Objective:       Admitted with R leg ulcer, hx of ESRD, COPD, A. fib, lupus, diabetes, HTN, DVT.      Hospital course: r/o calciphylaxis versus warfarin skin necrosis.Biopies obtained 3/25, 3/28.    Action/Plan: Transition to CIR  pending insurance approval... CM following for disposition needs.  Expected Discharge Date:   12/28/2017           Expected Discharge Plan:  IP Rehab Facility  In-House Referral:  Nutrition, Clinical Social Work  Discharge planning Services  CM Consult  Post Acute Care Choice:    Choice offered to:     DME Arranged:    DME Agency:     HH Arranged:    [place] Agency:     Status of Service:  In process, will continue to follow  If discussed at Long Length of Stay Meetings, dates discussed:    Additional Comments:  Sharin Mons, RN 12/28/2017, 10:42 AM

## 2017-12-28 NOTE — NC FL2 (Signed)
[name] LEVEL OF CARE SCREENING TOOL     IDENTIFICATION  Patient Name: [name] Birthdate: 06/08/1946 Sex: female Admission Date (Current Location): 12/14/2017  [name] and [place] Number:  Herbalist and Address:  [facility] [place], [place] [address] [place], [place] [address]      Provider Number: [id number]  Attending Physician Name and Address:  Edwin Dada, *  Relative Name and Phone Number:  Izell [employer], spouse, [phone]    Current Level of Care: Little Recommended Level of Care: [place] Prior Approval Number:    Date Approved/Denied:   PASRR Number: [id number]  Discharge Plan: SNF    Current Diagnoses: Patient Active Problem List   Diagnosis Date Noted  . Ischemic ulcer of toes on both feet ([name])   . Cellulitis in diabetic foot ([name]) 12/14/2017  . Atrial fibrillation ([name]) 12/14/2017  . Glaucoma 12/14/2017  . ESRD (end stage renal disease) on dialysis ([name]) 11/16/2017  . COPD with acute exacerbation ([name]) 11/03/2017  . Community acquired pneumonia 11/02/2017  . Nausea and vomiting 11/02/2017  . Iron deficiency anemia 08/07/2017  . Heme positive stool 10/04/2015  . Mixed hyperlipidemia 06/29/2015  . Type 2 diabetes mellitus with stage 4 chronic kidney disease ([name]) 06/29/2015  . Chronic kidney disease, stage V ([name]) 04/12/2014  . 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 Encounter for therapeutic drug monitoring 03/30/2014  . Acute respiratory failure with hypoxia ([name]) 03/15/2014  . Chronic diastolic heart failure ([name]) 03/15/2014  . Anemia of chronic renal failure, stage 4 (severe) ([name]) 02/23/2014  . Folate deficiency 02/17/2014  . Aortic regurgitation 09/19/2012  . Atrial flutter ([place]) 09/14/2012  . Class 2 severe obesity due to excess calories with serious comorbidity and body mass index (BMI) of 35.0 to 35.9 in adult  ([name]) 09/14/2012  . Type 2 diabetes mellitus with diabetic neuropathy ([name]) 09/14/2012  . SLE (systemic lupus erythematosus) ([name]) 09/14/2012  . Essential hypertension, benign 09/14/2012  . COPD (chronic obstructive pulmonary disease) ([name]) 09/14/2012    Orientation RESPIRATION BLADDER Height & Weight     Self, Time, Situation, Place  Normal Continent, External catheter Weight: 76 kg (167 lb 8.8 oz) Height:  5\' 5"  (165.1 cm)  BEHAVIORAL SYMPTOMS/MOOD NEUROLOGICAL BOWEL NUTRITION STATUS      Continent Diet(Please see DC Summary)  AMBULATORY STATUS COMMUNICATION OF NEEDS Skin   Limited Assist Verbally Other (Comment)(Wound on leg and toe with guaze dressing)                       Personal Care Assistance Level of Assistance  Bathing, Feeding, Dressing Bathing Assistance: Limited assistance Feeding assistance: Independent Dressing Assistance: Limited assistance     Functional Limitations Info  Sight Sight Info: Impaired        SPECIAL CARE FACTORS FREQUENCY  PT (By licensed PT), OT (By licensed OT)     PT Frequency: 5x/week OT Frequency: 2x/week            Contractures      Additional Factors Info  Insulin Sliding Scale Code Status Info: Full Allergies Info: Ace Inhibitors   Insulin Sliding Scale Info: 3x daily with meals       Current Medications (12/28/2017):  This is the current Little active medication list Current Facility-Administered Medications  Medication Dose Route Frequency Provider Last Rate Last Dose  . 0.9 %  sodium chloride infusion   Intravenous Continuous [name]  E, MD      . acetaminophen (TYLENOL) tablet 650 mg  650 mg Oral Q6H PRN Elam Dutch, MD   650 mg at 12/27/17 2201   Or  . acetaminophen (TYLENOL) suppository 650 mg  650 mg Rectal Q6H PRN Elam Dutch, MD      . acetaminophen (TYLENOL) tablet 650 mg  650 mg Oral Q4H PRN Elam Dutch, MD   650 mg at 12/28/17 0911  . apixaban (ELIQUIS) tablet 5 mg  5 mg  Oral BID Mariel Aloe, MD   5 mg at 12/28/17 0900  . atorvastatin (LIPITOR) tablet 20 mg  20 mg Oral Daily Elam Dutch, MD   20 mg at 12/28/17 0900  . timolol (TIMOPTIC) 0.5 % ophthalmic solution 1 drop  1 drop Left Eye Daily Elam Dutch, MD   1 drop at 12/28/17 0901   And  . brimonidine (ALPHAGAN) 0.2 % ophthalmic solution 1 drop  1 drop Left Eye Daily Elam Dutch, MD   1 drop at 12/28/17 0901  . clotrimazole (GYNE-LOTRIMIN 3) 2 % vaginal cream 1 Applicatorful  1 Applicatorful Vaginal QHS Danford, Suann Larry, MD      . Darbepoetin Alfa (ARANESP) injection 200 mcg  200 mcg Intravenous Q Thu-HD Loren Racer, PA-C   200 mcg at 12/24/17 1148  . febuxostat (ULORIC) tablet 40 mg  40 mg Oral Daily Elam Dutch, MD   40 mg at 12/28/17 0859  . feeding supplement (NEPRO CARB STEADY) liquid 237 mL  237 mL Oral Q24H Elam Dutch, MD   237 mL at 12/28/17 1536  . feeding supplement (PRO-STAT SUGAR FREE 64) liquid 30 mL  30 mL Oral BID Elam Dutch, MD   30 mL at 12/28/17 0900  . folic acid (FOLVITE) tablet 1 mg  1 mg Oral Daily Elam Dutch, MD   1 mg at 12/28/17 0900  . haloperidol (HALDOL) tablet 2 mg  2 mg Oral Daily PRN Edwin Dada, MD   2 mg at 12/25/17 2346  . hydrALAZINE (APRESOLINE) injection 5 mg  5 mg Intravenous Q20 Min PRN Elam Dutch, MD      . hydroxychloroquine (PLAQUENIL) tablet 300 mg  300 mg Oral Daily Elam Dutch, MD   300 mg at 12/28/17 0900  . insulin aspart (novoLOG) injection 0-9 Units  0-9 Units Subcutaneous TID WC Elam Dutch, MD   2 Units at 12/28/17 1245  . ipratropium-albuterol (DUONEB) 0.5-2.5 (3) MG/3ML nebulizer solution 3 mL  3 mL Nebulization Q6H PRN Elam Dutch, MD      . ketorolac (ACULAR) 0.5 % ophthalmic solution 1 drop  1 drop Left Eye QID Elam Dutch, MD   1 drop at 12/28/17 1448  . labetalol (NORMODYNE,TRANDATE) injection 10 mg  10 mg Intravenous Q10 min PRN Elam Dutch, MD    10 mg at 12/22/17 0247  . latanoprost (XALATAN) 0.005 % ophthalmic solution 1 drop  1 drop Left Eye QHS Elam Dutch, MD   1 drop at 12/27/17 2201  . lidocaine (XYLOCAINE) 1 % (with pres) injection 10 mL  10 mL Intradermal PRN Meuth, Brooke A, PA-C      . lidocaine (XYLOCAINE) 2 % injection 0-20 mL  0-20 mL Intradermal Once PRN Earnstine Regal, PA-C      . MEDLINE mouth rinse  15 mL Mouth Rinse BID Edwin Dada, MD   15 mL at 12/27/17 2202  .  metoprolol tartrate (LOPRESSOR) tablet 25 mg  25 mg Oral Q T,Th,Sat-1800 Elam Dutch, MD   25 mg at 12/26/17 1751  . metoprolol tartrate (LOPRESSOR) tablet 50 mg  50 mg Oral 2 times per day on Sun Mon Wed Fri Elam Dutch, MD   50 mg at 12/28/17 0623  . morphine 2 MG/ML injection 2 mg  2 mg Intravenous Q1H PRN Jalene Mullet, RPH   2 mg at 12/28/17 1248  . nutrition supplement (JUVEN) (JUVEN) powder packet 1 packet  1 packet Oral BID BM Elam Dutch, MD   1 packet at 12/28/17 0900  . ondansetron (ZOFRAN) tablet 4 mg  4 mg Oral Q6H PRN Elam Dutch, MD   4 mg at 12/16/17 2225   Or  . ondansetron (ZOFRAN) injection 4 mg  4 mg Intravenous Q6H PRN Elam Dutch, MD      . polyethylene glycol (MIRALAX / GLYCOLAX) packet 17 g  17 g Oral BID Mariel Aloe, MD   17 g at 12/27/17 2205  . predniSONE (DELTASONE) tablet 5 mg  5 mg Oral Q breakfast Elam Dutch, MD   5 mg at 12/28/17 0900  . sodium chloride flush (NS) 0.9 % injection 3 mL  3 mL Intravenous Q12H Elam Dutch, MD   3 mL at 12/28/17 0911  . sodium chloride flush (NS) 0.9 % injection 3 mL  3 mL Intravenous PRN Elam Dutch, MD      . sodium thiosulfate 25 g in sodium chloride 0.9 % 200 mL Infusion for Calciphylaxis  25 g Intravenous Q T,Th,Sa-HD Valentina Gu, NP   Stopped at 12/26/17 1620     Discharge Medications: Please see discharge summary for a list of discharge medications.  Relevant Imaging Results:  Relevant Lab  Results:   Additional Information SSN [id number]. Patient gets dialysis at [facility] and will need transport  Benard Halsted, [place]

## 2017-12-28 NOTE — Progress Notes (Signed)
I met with patient at bedside and contacted her spouse by conference call. We discussed goals, expectations and co pays for a possible inpt rehab admit. He prefers Inpt rehab rather than SNF, even with cost. I will begin insurance authorization for a possible admit today pending insurance approval. RN CM and SW made aware of plan. I will follow up today. [phone]

## 2017-12-28 NOTE — Progress Notes (Addendum)
[facility] Progress Note  Subjective: no c/o, in good spirits  Vitals:   12/27/17 0825 12/27/17 1437 12/27/17 2135 12/28/17 0543  BP:  (!) 157/53 (!) 143/81 (!) 165/62  Pulse: 74 75 80 71  Resp:  17 18 18   Temp:  97.7 F (36.5 C) 98 F (36.7 C) 98.6 F (37 C)  TempSrc:  Oral    SpO2:  100% 100% 99%  Weight:      Height:        Inpatient medications: . apixaban  5 mg Oral BID  . atorvastatin  20 mg Oral Daily  . timolol  1 drop Left Eye Daily   And  . brimonidine  1 drop Left Eye Daily  . darbepoetin (ARANESP) injection - DIALYSIS  200 mcg Intravenous Q Thu-HD  . febuxostat  40 mg Oral Daily  . feeding supplement (NEPRO CARB STEADY)  237 mL Oral Q24H  . feeding supplement (PRO-STAT SUGAR FREE 64)  30 mL Oral BID  . folic acid  1 mg Oral Daily  . hydroxychloroquine  300 mg Oral Daily  . insulin aspart  0-9 Units Subcutaneous TID WC  . ketorolac  1 drop Left Eye QID  . latanoprost  1 drop Left Eye QHS  . mouth rinse  15 mL Mouth Rinse BID  . metoprolol tartrate  25 mg Oral Q T,Th,Sat-1800  . metoprolol tartrate  50 mg Oral 2 times per day on Sun Mon Wed Fri  . nutrition supplement (JUVEN)  1 packet Oral BID BM  . polyethylene glycol  17 g Oral BID  . predniSONE  5 mg Oral Q breakfast  . sodium chloride flush  3 mL Intravenous Q12H   . sodium chloride    . sodium thiosulfate infusion for calciphylaxis Stopped (12/26/17 1620)   acetaminophen **OR** acetaminophen, acetaminophen, haloperidol, hydrALAZINE, ipratropium-albuterol, labetalol, lidocaine, lidocaine, morphine injection, ondansetron **OR** ondansetron (ZOFRAN) IV, sodium chloride flush  Exam: Elderly AAF, no distress, frail No jvd Chest clear bilat RRR  Abd soft, obese, ntnd, multiple old bruises lower abd but no nodules or ulcerations Ext large R post calf necrotic lesion, looks slightly larger than 2 wks ago, tender, no drainage No LE edema NF, ox 3, gen'd weak   Dialysis: [facility] 4h   400800  3k/ low Ca 2.0 bath (for #1)  78kg  R IJ TDC  Heparin std - calcitriol 0.25 ug - mircera 50 q 2 wks , last 3/5 (got darbe 200 ug here on 3/28)      Impression: 1  RLE calf wound: 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  Cont to treat as such, have dc'd Ca++ and vit D containing products, getting Na thio w each HD. Low Ca++ (2.0) bath.  Supportive care from here, prognosis is difficult to say, either gets better or gets worse from here.   2  ESRD HD TTS.  Has 1st stage L arm BVT from 2015, VVS has seen here. Cath-dependent at this time.  3  HTN / vol:  Under dry by 2kg, BP's good on metoprolol only 4  Anemia of CKD/ chronic disease: cont darbe 200 q thurs, no IV iron given #1 5  MBD of ckd: dc'd phoslo/ calcitriol due to #1, use nonCa containing binders when needed 6  Afib : coumadin dc'd d/t #1, on Eliquis now 7  SLE: on plaquenil 8  Dispo: being eval by [facility] pager [phone]   12/28/2017, 12:59 PM   Recent Labs  Lab 12/26/17 0756 12/27/17 0358 12/28/17 0712  NA 132* 136 135  K 4.1 3.7 3.3*  CL 97* 100* 97*  CO2 26 27 27   GLUCOSE 82 139* 140*  BUN 64* 30* 60*  CREATININE 4.16* 3.18* 4.36*  CALCIUM 8.5* 8.5* 8.6*  PHOS 2.3* 1.5* 1.3*   Recent Labs  Lab 12/26/17 0756 12/27/17 0358 12/28/17 0712  ALBUMIN 2.1* 2.2* 2.1*   Recent Labs  Lab 12/24/17 0916 12/25/17 0636 12/26/17 0752  WBC 8.5 7.5 6.4  HGB 7.2* 8.0* 7.9*  HCT 24.3* 26.2* 25.0*  MCV 93.1 94.6 94.0  PLT 235 235 249   Iron/TIBC/Ferritin/ %Sat    Component Value Date/Time   IRON 12 (L) 11/03/2017 1432   TIBC 104 (L) 11/03/2017 1432   FERRITIN 752 (H) 11/03/2017 1432   IRONPCTSAT 12 11/03/2017 1432

## 2017-12-28 NOTE — Progress Notes (Signed)
Suture removed from right left leg biopsy site per order.

## 2017-12-28 NOTE — Progress Notes (Signed)
PROGRESS NOTE    [name]  SEG:[id number] DOB: 10-14-1945 DOA: 12/14/2017 PCP: Sharilyn Sites, MD      Brief Narrative:  This is Neukam is a 72 year old female with ESRD, COPD, A. fib, lupus, diabetes, HTN, DVT who presents with right leg ulcer.  She was started on empiric antibiotics, without improvement.  Vascular surgery was consulted, but duplex ultrasounds of the leg showed no evidence of stenosis.    Biopsy was obtained 3/25, not definitive.  Second one obtained 3/28.   Assessment & Plan:  Right leg ulcer Biopsy report today shows chronic stasis dermatitis as well as arterial insufficiency changes which she obviously has, but is again somewhat ambivalent on the possibility of calciphylaxis, unfortunately.  -Continue acetaminophen, seems to be well controlled just on -Continue sodium thiosulfate per nephrology   Hallucinations/acute metabolic encephalopathy Hallucinations resolved.  Nephrology was correct this with oxycodone.  MRI brain unremarkable, ammonia, TSH, B12, complements, anti-dsDNA essentially within normal limits.    -No opiates -PT, OT   Atrial fibrillation, paroxysmal Chads 2 Vasc 4.  Rate controlled.  Anticoagulated previously with warfarin. -Continue apixaban, metoprolol  ESRD -Consulted nephrology for HD, appreciate excellent cares  Lupus No change -Continue prednisone, Plaquenil  Diabetes Well controlled. Blood sugars very low even with sparse SSI -Hold Levemir at discharge -Continue SSI  COPD No evidence of disease  Chronic constipation -Continue MiraLAX twice daily  Other medications -Continue statin -Continue eye drops -Continue Uloric -Continue dicyclomine   Vaginitis A litle blood with wiping, feels irritation, similar to previous yeast infections. -3 days Monistat       DVT prophylaxis: N/A on apixaban Code Status: FULL Family Communication: Husband at bedside MDM and disposition Plan: The laboratory reports  and imaging below reviewed, summarized above.  Her delirium is resolved.  She was initially admitted for patient ideally would require 6 cellulitis of her right leg ulcer.  Her discharge was delayed due to the need for biopsy and subsequent delirium.  Currently she is pending evaluation for inpatient rehab due to the fact that she continues to require 1 person assist for transfers and ambulation, which is significantly different from her baseline.  She appears to be making good progress with physical therapy, has good motivation, is an excellent rehab candidate to be able to return home with her husband.      Consultants:   Nephrology     Subjective: She is oriented to person place and time, her hallucinations are still resolved, her right leg has mild pain, worse with walking.  No new fever, cough, sputum.  No new skin rashes.  No joint pains.     Resting in bed, eating lunch. Objective: Vitals:   12/27/17 1437 12/27/17 2135 12/28/17 0543 12/28/17 1400  BP: (!) 157/53 (!) 143/81 (!) 165/62 (!) 162/78  Pulse: 75 80 71 76  Resp: 17 18 18 20   Temp: 97.7 F (36.5 C) 98 F (36.7 C) 98.6 F (37 C) 98.8 F (37.1 C)  TempSrc: Oral     SpO2: 100% 100% 99% (!) 85%  Weight:      Height:        Intake/Output Summary (Last 24 hours) at 12/28/2017 1836 Last data filed at 12/28/2017 1358 Gross per 24 hour  Intake 503 ml  Output 3 ml  Net 500 ml   Filed Weights   12/26/17 0530 12/26/17 0753 12/26/17 1154  Weight: 75.9 kg (167 lb 6.4 oz) 77 kg (169 lb 12.1 oz) 76 kg (167 lb 8.8  oz)    Examination: General appearance: Elderly female, lying in bed, interactive, no acute distress. HEENT: C no change to exotropia, dentures in place, otherwise oral pharynx is moist, no lesions.    Skin: Her wound is examined again, there is no redness, induration, swelling around the wound.  This is oblong, flat, punched-out ulcer, with a dark base.  There is no discharge. Cardiac: Heart rate is regular,  rhythm regular, no murmurs, no lower extremity edema. Respiratory: Normal respiratory effort, no wheezes or rales. Abdomen: Abdomen soft, no distention, no hepatosplenomegaly, persistent pain in her right carotid pannus. GU: Deferred Psych: She is alert and oriented to person place and time.  Her cognitive responses are somewhat slowed, but overall her judgment and insight appear normal for age.  She is having no hallucinations.       Data Reviewed: I have personally reviewed following labs and imaging studies:  CBC: Recent Labs  Lab 12/22/17 0240 12/23/17 1527 12/24/17 0916 12/25/17 0636 12/26/17 0752  WBC 9.1  --  8.5 7.5 6.4  HGB 8.1*  --  7.2* 8.0* 7.9*  HCT 26.2* 24.9* 24.3* 26.2* 25.0*  MCV 90.3  --  93.1 94.6 94.0  PLT 219  --  235 235 016   Basic Metabolic Panel: Recent Labs  Lab 12/25/17 0602 12/26/17 0643 12/26/17 0756 12/27/17 0358 12/28/17 0712  NA 135 133* 132* 136 135  K 4.0 4.4 4.1 3.7 3.3*  CL 98* 95* 97* 100* 97*  CO2 28 28 26 27 27   GLUCOSE 113* 82 82 139* 140*  BUN 31* 64* 64* 30* 60*  CREATININE 2.92* 4.06* 4.16* 3.18* 4.36*  CALCIUM 8.4* 8.8* 8.5* 8.5* 8.6*  PHOS 1.8* 2.2* 2.3* 1.5* 1.3*   GFR: Estimated Creatinine Clearance: 11.9 mL/min (A) (by C-G formula based on SCr of 4.36 mg/dL (H)). Liver Function Tests: Recent Labs  Lab 12/25/17 0602 12/26/17 0109 12/26/17 0756 12/27/17 0358 12/28/17 0712  ALBUMIN 2.2* 2.2* 2.1* 2.2* 2.1*   No results for input(s): LIPASE, AMYLASE in the last 168 hours. Recent Labs  Lab 12/23/17 1527  AMMONIA 18   Coagulation Profile: No results for input(s): INR, PROTIME in the last 168 hours. Cardiac Enzymes: No results for input(s): CKTOTAL, CKMB, CKMBINDEX, TROPONINI in the last 168 hours. BNP (last 3 results) No results for input(s): PROBNP in the last 8760 hours. HbA1C: No results for input(s): HGBA1C in the last 72 hours. CBG: Recent Labs  Lab 12/27/17 1800 12/27/17 2136 12/28/17 0753  12/28/17 1232 12/28/17 1657  GLUCAP 266* 139* 122* 156* 181*   Lipid Profile: No results for input(s): CHOL, HDL, LDLCALC, TRIG, CHOLHDL, LDLDIRECT in the last 72 hours. Thyroid Function Tests: No results for input(s): TSH, T4TOTAL, FREET4, T3FREE, THYROIDAB in the last 72 hours. Anemia Panel: No results for input(s): VITAMINB12, FOLATE, FERRITIN, TIBC, IRON, RETICCTPCT in the last 72 hours. Urine analysis:    Component Value Date/Time   COLORURINE AMBER (A) 11/19/2017 0300   APPEARANCEUR HAZY (A) 11/19/2017 0300   LABSPEC 1.021 11/19/2017 0300   PHURINE 5.0 11/19/2017 0300   GLUCOSEU NEGATIVE 11/19/2017 0300   HGBUR NEGATIVE 11/19/2017 0300   BILIRUBINUR NEGATIVE 11/19/2017 0300   KETONESUR NEGATIVE 11/19/2017 0300   PROTEINUR NEGATIVE 11/19/2017 0300   UROBILINOGEN 1.0 03/17/2014 1830   NITRITE NEGATIVE 11/19/2017 0300   LEUKOCYTESUR MODERATE (A) 11/19/2017 0300   Sepsis Labs: @LABRCNTIP (procalcitonin:4,lacticacidven:4)  ) No results found for this or any previous visit (from the past 240 hour(s)).  Radiology Studies: No results found.      Scheduled Meds: . apixaban  5 mg Oral BID  . atorvastatin  20 mg Oral Daily  . timolol  1 drop Left Eye Daily   And  . brimonidine  1 drop Left Eye Daily  . clotrimazole  1 Applicatorful Vaginal QHS  . darbepoetin (ARANESP) injection - DIALYSIS  200 mcg Intravenous Q Thu-HD  . febuxostat  40 mg Oral Daily  . feeding supplement (NEPRO CARB STEADY)  237 mL Oral Q24H  . feeding supplement (PRO-STAT SUGAR FREE 64)  30 mL Oral BID  . folic acid  1 mg Oral Daily  . hydroxychloroquine  300 mg Oral Daily  . insulin aspart  0-9 Units Subcutaneous TID WC  . ketorolac  1 drop Left Eye QID  . latanoprost  1 drop Left Eye QHS  . mouth rinse  15 mL Mouth Rinse BID  . metoprolol tartrate  25 mg Oral Q T,Th,Sat-1800  . metoprolol tartrate  50 mg Oral 2 times per day on Sun Mon Wed Fri  . nutrition supplement (JUVEN)  1 packet  Oral BID BM  . polyethylene glycol  17 g Oral BID  . predniSONE  5 mg Oral Q breakfast  . sodium chloride flush  3 mL Intravenous Q12H   Continuous Infusions: . sodium chloride    . sodium thiosulfate infusion for calciphylaxis Stopped (12/26/17 1620)     LOS: 14 days    Time spent: 25 minutes    Edwin Dada, MD [facility] 12/28/2017, 6:36 PM     Pager [phone] --- please page though AMION:  [email or web address] If 7PM-7AM, please contact night-coverage

## 2017-12-28 NOTE — Progress Notes (Signed)
Insurance has denied approval for an inpt rehab admit. I met with pt and her spouse at bedside and they are aware. They wish to pursue SNF. RN CM and SW made aware. We will sign off at this time. [phone]

## 2017-12-29 DIAGNOSIS — E876 Hypokalemia: Secondary | ICD-10-CM | POA: Diagnosis not present

## 2017-12-29 DIAGNOSIS — E11628 Type 2 diabetes mellitus with other skin complications: Secondary | ICD-10-CM | POA: Diagnosis not present

## 2017-12-29 DIAGNOSIS — D689 Coagulation defect, unspecified: Secondary | ICD-10-CM | POA: Diagnosis not present

## 2017-12-29 DIAGNOSIS — Z86718 Personal history of other venous thrombosis and embolism: Secondary | ICD-10-CM | POA: Diagnosis not present

## 2017-12-29 DIAGNOSIS — I70232 Atherosclerosis of native arteries of right leg with ulceration of calf: Secondary | ICD-10-CM | POA: Diagnosis not present

## 2017-12-29 DIAGNOSIS — N186 End stage renal disease: Secondary | ICD-10-CM | POA: Diagnosis not present

## 2017-12-29 DIAGNOSIS — D509 Iron deficiency anemia, unspecified: Secondary | ICD-10-CM | POA: Diagnosis not present

## 2017-12-29 DIAGNOSIS — G8929 Other chronic pain: Secondary | ICD-10-CM | POA: Diagnosis not present

## 2017-12-29 DIAGNOSIS — Z992 Dependence on renal dialysis: Secondary | ICD-10-CM | POA: Diagnosis not present

## 2017-12-29 DIAGNOSIS — J449 Chronic obstructive pulmonary disease, unspecified: Secondary | ICD-10-CM | POA: Diagnosis not present

## 2017-12-29 DIAGNOSIS — M159 Polyosteoarthritis, unspecified: Secondary | ICD-10-CM | POA: Diagnosis not present

## 2017-12-29 DIAGNOSIS — R300 Dysuria: Secondary | ICD-10-CM | POA: Diagnosis not present

## 2017-12-29 DIAGNOSIS — M109 Gout, unspecified: Secondary | ICD-10-CM | POA: Diagnosis not present

## 2017-12-29 DIAGNOSIS — E119 Type 2 diabetes mellitus without complications: Secondary | ICD-10-CM | POA: Diagnosis not present

## 2017-12-29 DIAGNOSIS — E1122 Type 2 diabetes mellitus with diabetic chronic kidney disease: Secondary | ICD-10-CM | POA: Diagnosis not present

## 2017-12-29 DIAGNOSIS — H409 Unspecified glaucoma: Secondary | ICD-10-CM | POA: Diagnosis not present

## 2017-12-29 DIAGNOSIS — R195 Other fecal abnormalities: Secondary | ICD-10-CM | POA: Diagnosis not present

## 2017-12-29 DIAGNOSIS — E1151 Type 2 diabetes mellitus with diabetic peripheral angiopathy without gangrene: Secondary | ICD-10-CM | POA: Diagnosis not present

## 2017-12-29 DIAGNOSIS — N185 Chronic kidney disease, stage 5: Secondary | ICD-10-CM | POA: Diagnosis not present

## 2017-12-29 DIAGNOSIS — I5032 Chronic diastolic (congestive) heart failure: Secondary | ICD-10-CM | POA: Diagnosis not present

## 2017-12-29 DIAGNOSIS — I4891 Unspecified atrial fibrillation: Secondary | ICD-10-CM | POA: Diagnosis not present

## 2017-12-29 DIAGNOSIS — L97219 Non-pressure chronic ulcer of right calf with unspecified severity: Secondary | ICD-10-CM | POA: Diagnosis not present

## 2017-12-29 DIAGNOSIS — M329 Systemic lupus erythematosus, unspecified: Secondary | ICD-10-CM | POA: Diagnosis not present

## 2017-12-29 DIAGNOSIS — E1129 Type 2 diabetes mellitus with other diabetic kidney complication: Secondary | ICD-10-CM | POA: Diagnosis not present

## 2017-12-29 DIAGNOSIS — E785 Hyperlipidemia, unspecified: Secondary | ICD-10-CM | POA: Diagnosis not present

## 2017-12-29 DIAGNOSIS — G8911 Acute pain due to trauma: Secondary | ICD-10-CM | POA: Diagnosis not present

## 2017-12-29 DIAGNOSIS — R1031 Right lower quadrant pain: Secondary | ICD-10-CM | POA: Diagnosis not present

## 2017-12-29 DIAGNOSIS — N2581 Secondary hyperparathyroidism of renal origin: Secondary | ICD-10-CM | POA: Diagnosis not present

## 2017-12-29 DIAGNOSIS — Z23 Encounter for immunization: Secondary | ICD-10-CM | POA: Diagnosis not present

## 2017-12-29 DIAGNOSIS — D649 Anemia, unspecified: Secondary | ICD-10-CM | POA: Diagnosis not present

## 2017-12-29 DIAGNOSIS — R262 Difficulty in walking, not elsewhere classified: Secondary | ICD-10-CM | POA: Diagnosis not present

## 2017-12-29 DIAGNOSIS — I132 Hypertensive heart and chronic kidney disease with heart failure and with stage 5 chronic kidney disease, or end stage renal disease: Secondary | ICD-10-CM | POA: Diagnosis not present

## 2017-12-29 DIAGNOSIS — E039 Hypothyroidism, unspecified: Secondary | ICD-10-CM | POA: Diagnosis not present

## 2017-12-29 DIAGNOSIS — Z7901 Long term (current) use of anticoagulants: Secondary | ICD-10-CM | POA: Diagnosis not present

## 2017-12-29 DIAGNOSIS — Z7952 Long term (current) use of systemic steroids: Secondary | ICD-10-CM | POA: Diagnosis not present

## 2017-12-29 DIAGNOSIS — D631 Anemia in chronic kidney disease: Secondary | ICD-10-CM | POA: Diagnosis not present

## 2017-12-29 DIAGNOSIS — Z794 Long term (current) use of insulin: Secondary | ICD-10-CM | POA: Diagnosis not present

## 2017-12-29 DIAGNOSIS — L03115 Cellulitis of right lower limb: Secondary | ICD-10-CM | POA: Diagnosis not present

## 2017-12-29 DIAGNOSIS — I509 Heart failure, unspecified: Secondary | ICD-10-CM | POA: Diagnosis not present

## 2017-12-29 DIAGNOSIS — M1990 Unspecified osteoarthritis, unspecified site: Secondary | ICD-10-CM | POA: Diagnosis not present

## 2017-12-29 DIAGNOSIS — Z87891 Personal history of nicotine dependence: Secondary | ICD-10-CM | POA: Diagnosis not present

## 2017-12-29 DIAGNOSIS — L0390 Cellulitis, unspecified: Secondary | ICD-10-CM | POA: Diagnosis not present

## 2017-12-29 DIAGNOSIS — J438 Other emphysema: Secondary | ICD-10-CM | POA: Diagnosis not present

## 2017-12-29 DIAGNOSIS — I120 Hypertensive chronic kidney disease with stage 5 chronic kidney disease or end stage renal disease: Secondary | ICD-10-CM | POA: Diagnosis not present

## 2017-12-29 DIAGNOSIS — R319 Hematuria, unspecified: Secondary | ICD-10-CM | POA: Diagnosis not present

## 2017-12-29 DIAGNOSIS — N184 Chronic kidney disease, stage 4 (severe): Secondary | ICD-10-CM | POA: Diagnosis not present

## 2017-12-29 LAB — CBC
HCT: 26 % — ABNORMAL LOW (ref 36.0–46.0)
Hemoglobin: 7.8 g/dL — ABNORMAL LOW (ref 12.0–15.0)
MCH: 28.3 pg (ref 26.0–34.0)
MCHC: 30 g/dL (ref 30.0–36.0)
MCV: 94.2 fL (ref 78.0–100.0)
Platelets: 290 10*3/uL (ref 150–400)
RBC: 2.76 MIL/uL — AB (ref 3.87–5.11)
RDW: 16.8 % — AB (ref 11.5–15.5)
WBC: 7.9 10*3/uL (ref 4.0–10.5)

## 2017-12-29 LAB — RENAL FUNCTION PANEL
ALBUMIN: 2.1 g/dL — AB (ref 3.5–5.0)
Anion gap: 12 (ref 5–15)
BUN: 74 mg/dL — AB (ref 6–20)
CHLORIDE: 96 mmol/L — AB (ref 101–111)
CO2: 25 mmol/L (ref 22–32)
Calcium: 8.5 mg/dL — ABNORMAL LOW (ref 8.9–10.3)
Creatinine, Ser: 4.56 mg/dL — ABNORMAL HIGH (ref 0.44–1.00)
GFR, EST AFRICAN AMERICAN: 10 mL/min — AB (ref >60)
GFR, EST NON AFRICAN AMERICAN: 9 mL/min — AB (ref >60)
Glucose, Bld: 120 mg/dL — ABNORMAL HIGH (ref 65–99)
PHOSPHORUS: 1.5 mg/dL — AB (ref 2.5–4.6)
POTASSIUM: 3.3 mmol/L — AB (ref 3.5–5.1)
Sodium: 133 mmol/L — ABNORMAL LOW (ref 135–145)

## 2017-12-29 LAB — GLUCOSE, CAPILLARY: Glucose-Capillary: 84 mg/dL (ref 65–99)

## 2017-12-29 MED ORDER — CLOTRIMAZOLE 2 % VA CREA: 1.0000 | TOPICAL_CREAM | Freq: Every day | VAGINAL | 0 refills | Status: AC

## 2017-12-29 MED ORDER — LIDOCAINE-PRILOCAINE 2.5-2.5 % EX CREA
1.0000 "application " | TOPICAL_CREAM | CUTANEOUS | Status: DC | PRN
  Filled 2017-12-29: qty 5

## 2017-12-29 MED ORDER — SODIUM CHLORIDE 0.9 % IV SOLN: 100.0000 mL | INTRAVENOUS | Status: DC | PRN

## 2017-12-29 MED ORDER — PRO-STAT SUGAR FREE PO LIQD: 30.0000 mL | Freq: Two times a day (BID) | ORAL | 0 refills | Status: AC

## 2017-12-29 MED ORDER — NEPRO/CARBSTEADY PO LIQD: 237.0000 mL | ORAL | 0 refills | Status: DC

## 2017-12-29 MED ORDER — INSULIN ASPART 100 UNIT/ML ~~LOC~~ SOLN: 0.0000 [IU] | Freq: Three times a day (TID) | SUBCUTANEOUS | 11 refills | Status: DC

## 2017-12-29 MED ORDER — HEPARIN SODIUM (PORCINE) 1000 UNIT/ML DIALYSIS: 100.0000 [IU]/kg | Freq: Once | INTRAMUSCULAR | Status: DC

## 2017-12-29 MED ORDER — APIXABAN 5 MG PO TABS: 5.0000 mg | ORAL_TABLET | Freq: Two times a day (BID) | ORAL | 6 refills | Status: DC

## 2017-12-29 MED ORDER — PENTAFLUOROPROP-TETRAFLUOROETH EX AERO: 1.0000 "application " | INHALATION_SPRAY | CUTANEOUS | Status: DC | PRN

## 2017-12-29 NOTE — Progress Notes (Signed)
Patient will DC to: [facility] Anticipated DC date: 12/29/17 Family notified: Spouse Transport by: Corey Harold   Per MD patient ready for DC to [facility]. RN, patient, patient's family, and facility notified of DC. Discharge Summary sent to facility. RN given number for report [phone]). DC packet on chart. Ambulance transport requested for patient.   CSW signing off.  Cedric Fishman, LCSW Clinical Social Worker [phone]

## 2017-12-29 NOTE — Progress Notes (Signed)
Patient back from Dialysis. Alert and oriented, complaining of pain in RLE. Patient received PRN pain medicine. Will continue to monitor.

## 2017-12-29 NOTE — Progress Notes (Signed)
[facility] Progress Note  Subjective: no c/o, in good spirits  Vitals:   12/29/17 0945 12/29/17 1015 12/29/17 1045 12/29/17 1115  BP: (!) 152/51 (!) 152/85 (!) 121/41 (!) 116/46  Pulse: 70 88 73 74  Resp:    17  Temp:    97.9 F (36.6 C)  TempSrc:    Oral  SpO2:    100%  Weight:    76 kg (167 lb 8.8 oz)  Height:        Inpatient medications: . apixaban  5 mg Oral BID  . atorvastatin  20 mg Oral Daily  . timolol  1 drop Left Eye Daily   And  . brimonidine  1 drop Left Eye Daily  . clotrimazole  1 Applicatorful Vaginal QHS  . darbepoetin (ARANESP) injection - DIALYSIS  200 mcg Intravenous Q Thu-HD  . febuxostat  40 mg Oral Daily  . feeding supplement (NEPRO CARB STEADY)  237 mL Oral Q24H  . feeding supplement (PRO-STAT SUGAR FREE 64)  30 mL Oral BID  . folic acid  1 mg Oral Daily  . [START ON 12/30/2017] heparin  100 Units/kg Dialysis Once in dialysis  . hydroxychloroquine  300 mg Oral Daily  . insulin aspart  0-9 Units Subcutaneous TID WC  . ketorolac  1 drop Left Eye QID  . latanoprost  1 drop Left Eye QHS  . mouth rinse  15 mL Mouth Rinse BID  . metoprolol tartrate  25 mg Oral Q T,Th,Sat-1800  . metoprolol tartrate  50 mg Oral 2 times per day on Sun Mon Wed Fri  . nutrition supplement (JUVEN)  1 packet Oral BID BM  . polyethylene glycol  17 g Oral BID  . predniSONE  5 mg Oral Q breakfast  . sodium chloride flush  3 mL Intravenous Q12H   . sodium chloride    . sodium chloride    . sodium thiosulfate infusion for calciphylaxis 0 g (12/26/17 1620)   sodium chloride, acetaminophen **OR** acetaminophen, acetaminophen, haloperidol, hydrALAZINE, ipratropium-albuterol, lidocaine, lidocaine, lidocaine-prilocaine, morphine injection, ondansetron **OR** ondansetron (ZOFRAN) IV, pentafluoroprop-tetrafluoroeth, sodium chloride flush  Exam: Elderly AAF, no distress, frail No jvd Chest clear bilat RRR  Abd soft, obese, ntnd, multiple old bruises lower abd but no  nodules or ulcerations Ext large R post calf necrotic lesion, looks slightly larger than 2 wks ago, tender, no drainage No LE edema NF, ox 3, gen'd weak   Dialysis: TTS [facility] 4h  400800  3k/ low Ca 2.0 bath (for #1)  78kg  R IJ TDC  Heparin std - calcitriol 0.25 ug - mircera 50 q 2 wks , last 3/5 (got darbe 200 ug here on 3/28)      Impression: 1  RLE calf wound: 2nd biopsy showed vessel calcification.  Cont to treat as calciphylaxis, have dc'd Ca++ and vit D containing products, getting Na thio w each HD and continue w/ a low Ca++ (2.0) bath.  Is OK for dc from renal standpoint, will cont Na thio at OP HD.  2  ESRD HD TTS.  Has 1st stage L arm BVT from 2015, VVS has seen here. Cath-dependent at this time.  3  HTN / vol:  Under dry by 2kg, BP's good on metoprolol only 4  Anemia of CKD/ chronic disease: cont darbe 200 q thurs, no IV iron given #1 5  MBD of ckd: dc'd phoslo/ calcitriol due to #1, use nonCa containing binders when needed 6  Afib : coumadin dc'd d/t #  1, on Eliquis now 7  SLE: on plaquenil 8  Dispo: insurance turned down CIR admit, prob going to SNF  Plan - HD today   Kelly Splinter MD [place] pager [phone]   12/29/2017, 12:00 PM   Recent Labs  Lab 12/27/17 0358 12/28/17 0712 12/29/17 0500  NA 136 135 133*  K 3.7 3.3* 3.3*  CL 100* 97* 96*  CO2 27 27 25   GLUCOSE 139* 140* 120*  BUN 30* 60* 74*  CREATININE 3.18* 4.36* 4.56*  CALCIUM 8.5* 8.6* 8.5*  PHOS 1.5* 1.3* 1.5*   Recent Labs  Lab 12/27/17 0358 12/28/17 0712 12/29/17 0500  ALBUMIN 2.2* 2.1* 2.1*   Recent Labs  Lab 12/24/17 0916 12/25/17 0636 12/26/17 0752  WBC 8.5 7.5 6.4  HGB 7.2* 8.0* 7.9*  HCT 24.3* 26.2* 25.0*  MCV 93.1 94.6 94.0  PLT 235 235 249   Iron/TIBC/Ferritin/ %Sat    Component Value Date/Time   IRON 12 (L) 11/03/2017 1432   TIBC 104 (L) 11/03/2017 1432   FERRITIN 752 (H) 11/03/2017 1432   IRONPCTSAT 12 11/03/2017 1432

## 2017-12-29 NOTE — Clinical Social Work Placement (Signed)
   CLINICAL SOCIAL WORK PLACEMENT  NOTE  Date:  12/29/2017  Patient Details  Name: [name] MRN: [id number] Date of Birth: 17-Feb-1946  Clinical Social Work is seeking post-discharge placement for this patient at the [place] level of care (*CSW will initial, date and re-position this form in  chart as items are completed):  Yes   Patient/family provided with [place] Work Department's list of facilities offering this level of care within the geographic area requested by the patient (or if unable, by the patient's family).  Yes   Patient/family informed of their freedom to choose among providers that offer the needed level of care, that participate in Medicare, Medicaid or managed care program needed by the patient, have an available bed and are willing to accept the patient.  Yes   Patient/family informed of [facility]'s ownership interest in [facility] and [facility], as well as of the fact that they are under no obligation to receive care at these facilities.  PASRR submitted to EDS on       PASRR number received on       Existing PASRR number confirmed on 12/28/17     FL2 transmitted to all facilities in geographic area requested by pt/family on 12/28/17     FL2 transmitted to all facilities within larger geographic area on       Patient informed that his/her managed care company has contracts with or will negotiate with certain facilities, including the following:        Yes   Patient/family informed of bed offers received.  Patient chooses bed at [facility]     Physician recommends and patient chooses bed at      Patient to be transferred to [facility] [place] on 12/29/17.  Patient to be transferred to facility by [facility]     Patient family notified on 12/29/17 of transfer.  Name of family member notified:  Spouse     PHYSICIAN Please prepare priority discharge summary, including medications      Additional Comment:    _______________________________________________ Benard Halsted, [place] 12/29/2017, 12:30 PM

## 2017-12-29 NOTE — Progress Notes (Addendum)
Patient was discharged to nursing home ([facility]) by MD order; discharged instructions  review and sent to facility with care notes; IV D/C; HD - R IJ; facility was called for report but the nurse was not available (I sent my phone number); patient will be transported to facility via [place].Patient's spouse was called and informed that the patient is in her way to SNF.

## 2017-12-29 NOTE — Progress Notes (Signed)
PT Cancellation Note  Patient Details Name: [name] MRN: [id number] DOB: [birth year]   Cancelled Treatment:    Reason Eval/Treat Not Completed: (P) Patient at procedure or test/unavailable Pt currently at HD. PT will try to follow back this afternoon as able.  Damari Suastegui B. Migdalia Dk PT, DPT Acute Rehabilitation  [phone] Pager [phone]     [place] 12/29/2017, 8:31 AM

## 2017-12-29 NOTE — Discharge Summary (Signed)
Physician Discharge Summary  RAYLYNN HERSH ZOX:[id number] DOB: 22-Nov-1945 DOA: 12/14/2017  PCP: Sharilyn Sites, MD  Admit date: 12/14/2017 Discharge date: 12/29/2017  Admitted From: Home  Disposition:  SNF   Recommendations for Outpatient Follow-up:  1. Follow up with PCP in 1-2 weeks 2. Please refer back to the wound center for nursing care of her right leg wound   Home Health: N/A  Equipment/Devices: TBD at SNF rehab  Discharge Condition: Fair  CODE STATUS: FULL Diet recommendation: Renal, diabetic  Brief/Interim Summary: This is [name] is a 72 year old female with ESRD, COPD, A. fib, lupus, diabetes, HTN, DVT who presents with right leg ulcer.  She was started on empiric antibiotics, without improvement.  Vascular surgery was consulted, but duplex ultrasounds of the leg showed no evidence of stenosis.   Biopsy was obtained 3/25, not definitive.  Second one obtained 3/28.  Discharge delayed by opiate induced delirium.    Cellulitis of the right lower extremity without abscess Suspected calciphylaxis The patient was initially admitted with redness and swelling around her leg ulcer.  No fever or leukocytosis.  She was treated with 5 days of vancomycin.  The redness resolved.  She was monitored off antibiotics and had no recurrence of apparent infection.  In the meantime, the question of calciphylaxis or warfarin-induced necrosis of the skin was raised.  Biopsy was obtained which was not definitive.  Repeat biopsy of the edge of the ulcer was obtained, this showed findings consistent with both calciphylaxis or chronic venous and arterial insufficiency, but was also not definitive.  She was started on sodium thiosulfate which will be continued by nephrology.  All of her calcium and vitamin D products were stopped.   Delirium Metabolic encephalopathy After initiating oxycodone for her calciphylaxis, the patient started to develop hallucinations and mental disorientation.  She had  no signs of hyperammonemia, thyroid disease, B12 deficiency, lupus flare.  Oxycodone was stopped and this resolved.  Paroxysmal atrial fibrillation Her warfarin was stopped due to the concern for warfarin skin necrosis.  She was started on apixaban by nephrology.  End-stage renal disease This was managed by serial dialysis per her routine schedule by nephrology  Lupus She was continued on her home prednisone and Plaquenil  Diabetes, well controlled, insulin-dependent, associated with end-stage renal disease on dialysis Her blood sugars were well controlled in the last week before discharge on sporadic sliding scale insulin alone.  Her Levemir was held at discharge.  COPD She had no evidence of disease.  Vaginitis She described vaginal irritation, was started on Monistat.  Chronic abdominal pain CT of the abdomen and pelvis was normal.  This was persistent throughout her hospitalization, not associated with leukocytosis, vomiting, or evolution in character, improved with warm compress.  Discharge Diagnoses:  Principal Problem:   Cellulitis in diabetic foot ([place]) Active Problems:   Atrial flutter (HCC)   Type 2 diabetes mellitus with diabetic neuropathy (HCC)   SLE (systemic lupus erythematosus) (HCC)   Essential hypertension, benign   COPD (chronic obstructive pulmonary disease) (HCC)   Anemia of chronic renal failure, stage 4 (severe) (HCC)   Mixed hyperlipidemia   ESRD (end stage renal disease) on dialysis (HCC)   Atrial fibrillation (HCC)   Glaucoma   Ischemic ulcer of toes on both feet [facility])    Discharge Instructions  Discharge Instructions    Diet - low sodium heart healthy   Complete by:  As directed    Discharge instructions   Complete by:  As directed  From Dr. Loleta Books: You were admitted with infection of your right leg wound.  The biopsies suggested that this is calciphylaxis, and so we will continue to treat it as such with medicine directed by Nephrology,  given at your routine dialysis sessions.   Do not resume your home Vitamin D or Calcium.  For your confusion/hallucinations: These must have been caused by the oxycodone (pain medicine) that was given for your leg ulcer.  Avoid this.  For your Afib: Stop taking warfarin, as an alternative possibility that we can't rule out is that your ulcer is from warfarin (even if not, people who take warfarin have worse cases of calciphylaxis). Instead of warfarin, take apixaban 5 mg twice daily  For your diabetes: We have reduced your insulin in the hospital.  Stop levemir and take a low dose sliding scale with meals for now. Restart the levemir at Rehab if needed   Increase activity slowly   Complete by:  As directed      Allergies as of 12/29/2017      Reactions   Ace Inhibitors Cough      Medication List    STOP taking these medications   calcium acetate 667 MG capsule Commonly known as:  PHOSLO   HUMALOG KWIKPEN 100 UNIT/ML KiwkPen Generic drug:  insulin lispro   HYDROcodone-acetaminophen 5-325 MG tablet Commonly known as:  NORCO/VICODIN   iron polysaccharides 150 MG capsule Commonly known as:  NIFEREX   LEVEMIR FLEXTOUCH 100 UNIT/ML Pen Generic drug:  Insulin Detemir   SANTYL ointment Generic drug:  collagenase   Vitamin D (Ergocalciferol) 50000 units Caps capsule Commonly known as:  DRISDOL   warfarin 4 MG tablet Commonly known as:  COUMADIN     TAKE these medications   acetaminophen 500 MG tablet Commonly known as:  TYLENOL Take 1,000 mg by mouth every 6 (six) hours as needed. For pain   albuterol 0.63 MG/3ML nebulizer solution Commonly known as:  ACCUNEB Take 1 ampule by nebulization every 6 (six) hours as needed for wheezing or shortness of breath.   albuterol 108 (90 Base) MCG/ACT inhaler Commonly known as:  PROVENTIL HFA;VENTOLIN HFA Inhale 2 puffs into the lungs every 6 (six) hours as needed. For shortness of breath   apixaban 5 MG Tabs tablet Commonly  known as:  ELIQUIS Take 1 tablet (5 mg total) by mouth 2 (two) times daily.   atorvastatin 20 MG tablet Commonly known as:  LIPITOR Take 20 mg by mouth daily.   CENTRUM SILVER PO Take 1 tablet by mouth daily.   clotrimazole 2 % vaginal cream Commonly known as:  GYNE-LOTRIMIN 3 Place 1 Applicatorful vaginally at bedtime for 1 day.   COMBIGAN 0.2-0.5 % ophthalmic solution Generic drug:  brimonidine-timolol Place 1 drop into the left eye daily.   diclofenac 0.1 % ophthalmic solution Commonly known as:  VOLTAREN Place 1 drop into the left eye daily. Uses at lunch time   dicyclomine 10 MG capsule Commonly known as:  BENTYL Take 10 mg by mouth 4 (four) times daily -  before meals and at bedtime.   febuxostat 40 MG tablet Commonly known as:  ULORIC Take 40 mg by mouth daily.   feeding supplement (NEPRO CARB STEADY) Liqd Take 237 mLs by mouth daily.   feeding supplement (PRO-STAT SUGAR FREE 64) Liqd Take 30 mLs by mouth 2 (two) times daily.   folic acid 1 MG tablet Commonly known as:  FOLVITE Take 1 mg by mouth daily.   hydroxychloroquine 200  MG tablet Commonly known as:  PLAQUENIL Take 300 mg by mouth as directed. Take 1 1/2 tablets daily   insulin aspart 100 UNIT/ML injection Commonly known as:  novoLOG Inject 0-9 Units into the skin 3 (three) times daily with meals.   metoprolol tartrate 50 MG tablet Commonly known as:  LOPRESSOR Take 50 mg by mouth 2 (two) times daily. Take 1 tablet twice a day, except on Tuesday, Thursday, Saturday, take 1 tablet daily in the morning.   predniSONE 5 MG tablet Commonly known as:  DELTASONE Take 5 mg by mouth daily with breakfast.   travoprost (benzalkonium) 0.004 % ophthalmic solution Commonly known as:  TRAVATAN Place 1 drop into the left eye at bedtime.       Contact information for follow-up providers    Sharilyn Sites, MD. Call.   Specialty:  Family Medicine Contact information: [address] [place]  [address] [phone]            Contact information for after-discharge care    Destination    [facility] .   Service:  Skilled Nursing Contact information: [place] [phone]                 Allergies  Allergen Reactions  . Ace Inhibitors Cough    Consultations:  Nephrology   Procedures/Studies: Dg Chest 2 View  Result Date: 12/14/2017 CLINICAL DATA:  History of leg wound EXAM: CHEST - 2 VIEW COMPARISON:  11/03/2017 FINDINGS: Cardiac shadow is mildly prominent but likely related to the underlying dialysis state. Dialysis catheter is noted on the right and stable. The lungs are well aerated bilaterally without focal infiltrate or sizable effusion. No bony abnormality is seen. IMPRESSION: No active cardiopulmonary disease. Electronically Signed   By: Inez Catalina M.D.   On: 12/14/2017 16:19   Dg Tibia/fibula Right  Result Date: 12/14/2017 CLINICAL DATA:  Right leg ulcer EXAM: RIGHT TIBIA AND FIBULA - 2 VIEW COMPARISON:  11/02/2017 FINDINGS: No acute displaced fracture or malalignment. Fixating screw in the distal fibula with old fibular shaft fracture deformity. Soft tissue ulcer defect posterior mid lower leg. No soft tissue gas. Vascular calcifications and soft tissue calcifications. IMPRESSION: 1. No acute osseous abnormality 2. Old fracture deformity of the distal fibula 3. Small ulcer posterior soft tissues of the mid lower leg. Electronically Signed   By: Donavan Foil M.D.   On: 12/14/2017 17:08   Mr Brain Wo Contrast  Result Date: 12/26/2017 CLINICAL DATA:  72 y/o  F; hallucinations, history of lupus. EXAM: MRI HEAD WITHOUT CONTRAST TECHNIQUE: Multiplanar, multiecho pulse sequences of the brain and surrounding structures were obtained without intravenous contrast. COMPARISON:  None. FINDINGS: Brain: No acute infarction, hemorrhage, hydrocephalus, extra-axial collection or mass lesion. Small chronic lacunar  infarcts within bilateral thalamus and right pons. Fewnonspecific foci of T2 FLAIR hyperintense signal abnormality in subcortical and periventricular white matter are compatible withmildchronic microvascular ischemic changes for age. Moderate diffuse supratentorial and infratentorialbrain parenchymal volume loss. Nonspecific punctate foci of susceptibility hypointensity are present in the left putamen and left frontal white matter compatible with hemosiderin deposition of chronic microhemorrhage. Vascular: Normal flow voids. Skull and upper cervical spine: Normal marrow signal. Sinuses/Orbits: Mild ethmoid and maxillary sinus mucosal thickening. No abnormal signal of mastoid air cells. Orbits are unremarkable. Other: None. IMPRESSION: 1. No acute process or structural abnormality of the brain identified. 2. Mild chronic microvascular ischemic changes and moderate diffuse parenchymal volume loss of the brain.  Small chronic lacunar infarcts in basal ganglia and right pons. No specific findings of vasculitis. Electronically Signed   By: Kristine Garbe M.D.   On: 12/26/2017 01:10   Mr Foot Right Wo Contrast  Result Date: 12/15/2017 CLINICAL DATA:  Skin ulcerations since the fall of 2018 on the right second and third toes in a diabetic patient with end-stage renal disease. EXAM: MRI OF THE RIGHT FOREFOOT WITHOUT CONTRAST TECHNIQUE: Multiplanar, multisequence MR imaging of the right forefoot was performed. No intravenous contrast was administered. COMPARISON:  Plain films right foot 12/14/2017. FINDINGS: This exam is degraded by patient motion. Bones/Joint/Cartilage No bone marrow edema to suggest osteomyelitis is identified. No fracture is seen. There is some degenerative change about the first MTP joint. Ligaments Intact. Muscles and Tendons Mild intrasubstance increased T2 signal in intrinsic musculature is most consistent with diabetic myopathy. No intramuscular fluid collection. Soft tissues Negative.  The patient's skin ulcerations are not visible likely due to motion. IMPRESSION: Negative for abscess or osteomyelitis. Electronically Signed   By: Inge Rise M.D.   On: 12/15/2017 08:36   Dg Abd Portable 1v  Result Date: 12/16/2017 CLINICAL DATA:  Lower abdominal pain for several weeks EXAM: PORTABLE ABDOMEN - 1 VIEW COMPARISON:  11/02/2017 FINDINGS: Scattered large and small bowel gas is noted. Mild retained fecal material is seen. No obstructive changes are noted. No free air is noted. Degenerative changes of the lumbar spine are again seen. IMPRESSION: No acute abnormality noted. Electronically Signed   By: Inez Catalina M.D.   On: 12/16/2017 15:05   Dg Foot Complete Right  Result Date: 12/14/2017 CLINICAL DATA:  Blister at right middle toe for 1 month EXAM: RIGHT FOOT COMPLETE - 3+ VIEW COMPARISON:  07/10/2016 FINDINGS: No acute displaced fracture or malalignment. Moderate degenerative changes at the first MTP joint. Vascular calcifications. Possible tiny erosions at the distal phalanges of the second and third digits. No soft tissue gas. No radiopaque foreign body. Moderate plantar calcaneal spur with plantar soft tissue calcifications. IMPRESSION: 1. No acute fracture or malalignment 2. Possible tiny erosions at the tufts of the second and third distal phalanges. If osteomyelitis is suspected, would correlate with MRI. Electronically Signed   By: Donavan Foil M.D.   On: 12/14/2017 17:11       Subjective: Feels well.  Seen on dialysis.  No more halluciations.  No cough, sputum.  No redness or swlling of her right leg wound.  No respiratory distress.   Discharge Exam: Vitals:   12/29/17 1115 12/29/17 1220  BP: (!) 116/46 (!) 145/43  Pulse: 74 75  Resp: 17 18  Temp: 97.9 F (36.6 C) 97.7 F (36.5 C)  SpO2: 100%    Vitals:   12/29/17 1015 12/29/17 1045 12/29/17 1115 12/29/17 1220  BP: (!) 152/85 (!) 121/41 (!) 116/46 (!) 145/43  Pulse: 88 73 74 75  Resp:   17 18  Temp:    97.9 F (36.6 C) 97.7 F (36.5 C)  TempSrc:   Oral   SpO2:   100%   Weight:   76 kg (167 lb 8.8 oz)   Height:        General: Pt is alert, awake, not in acute distress, on the dialysis machine Cardiovascular: RRR, S1/S2 +, no rubs, no gallops Respiratory: CTA bilaterally, no wheezing, no rhonchi Abdominal: Soft, NT, ND, bowel sounds +, has some mild discomfort in her right lower pannus Extremities: no edema, no cyanosis Neuro: Chronic exotropia, no hallucinations, oriented to person, place  and time   The results of significant diagnostics from this hospitalization (including imaging, microbiology, ancillary and laboratory) are listed below for reference.     Microbiology: No results found for this or any previous visit (from the past 240 hour(s)).   Labs: BNP (last 3 results) Recent Labs    11/03/17 0801  BNP 9,794.8*   Basic Metabolic Panel: Recent Labs  Lab 12/26/17 0643 12/26/17 0756 12/27/17 0358 12/28/17 0712 12/29/17 0500  NA 133* 132* 136 135 133*  K 4.4 4.1 3.7 3.3* 3.3*  CL 95* 97* 100* 97* 96*  CO2 28 26 27 27 25   GLUCOSE 82 82 139* 140* 120*  BUN 64* 64* 30* 60* 74*  CREATININE 4.06* 4.16* 3.18* 4.36* 4.56*  CALCIUM 8.8* 8.5* 8.5* 8.6* 8.5*  PHOS 2.2* 2.3* 1.5* 1.3* 1.5*   Liver Function Tests: Recent Labs  Lab 12/26/17 0165 12/26/17 0756 12/27/17 0358 12/28/17 0712 12/29/17 0500  ALBUMIN 2.2* 2.1* 2.2* 2.1* 2.1*   No results for input(s): LIPASE, AMYLASE in the last 168 hours. Recent Labs  Lab 12/23/17 1527  AMMONIA 18   CBC: Recent Labs  Lab 12/23/17 1527 12/24/17 0916 12/25/17 0636 12/26/17 0752 12/29/17 1058  WBC  --  8.5 7.5 6.4 7.9  HGB  --  7.2* 8.0* 7.9* 7.8*  HCT 24.9* 24.3* 26.2* 25.0* 26.0*  MCV  --  93.1 94.6 94.0 94.2  PLT  --  235 235 249 290   Cardiac Enzymes: No results for input(s): CKTOTAL, CKMB, CKMBINDEX, TROPONINI in the last 168 hours. BNP: Invalid input(s): POCBNP CBG: Recent Labs  Lab 12/28/17 0753  12/28/17 1232 12/28/17 1657 12/28/17 2121 12/29/17 1213  GLUCAP 122* 156* 181* 164* 84   D-Dimer No results for input(s): DDIMER in the last 72 hours. Hgb A1c No results for input(s): HGBA1C in the last 72 hours. Lipid Profile No results for input(s): CHOL, HDL, LDLCALC, TRIG, CHOLHDL, LDLDIRECT in the last 72 hours. Thyroid function studies No results for input(s): TSH, T4TOTAL, T3FREE, THYROIDAB in the last 72 hours.  Invalid input(s): FREET3 Anemia work up No results for input(s): VITAMINB12, FOLATE, FERRITIN, TIBC, IRON, RETICCTPCT in the last 72 hours. Urinalysis    Component Value Date/Time   COLORURINE AMBER (A) 11/19/2017 0300   APPEARANCEUR HAZY (A) 11/19/2017 0300   LABSPEC 1.021 11/19/2017 0300   PHURINE 5.0 11/19/2017 0300   GLUCOSEU NEGATIVE 11/19/2017 0300   HGBUR NEGATIVE 11/19/2017 0300   BILIRUBINUR NEGATIVE 11/19/2017 0300   KETONESUR NEGATIVE 11/19/2017 0300   PROTEINUR NEGATIVE 11/19/2017 0300   UROBILINOGEN 1.0 03/17/2014 1830   NITRITE NEGATIVE 11/19/2017 0300   LEUKOCYTESUR MODERATE (A) 11/19/2017 0300   Sepsis Labs Invalid input(s): PROCALCITONIN,  WBC,  LACTICIDVEN Microbiology No results found for this or any previous visit (from the past 240 hour(s)).   Time coordinating discharge: 25 minutes  SIGNED:   Edwin Dada, MD  [facility] 12/29/2017, 2:59 PM

## 2017-12-29 NOTE — Care Management Note (Signed)
Case Management Note  Patient Details  Name: ALAZAY LEICHT MRN: [id number] Date of Birth: 10-12-1945  Subjective/Objective:         Admitted with R leg ulcer, hx of ESRD, COPD, A. fib, lupus, diabetes, HTN, DVT  Action/Plan: Transition to SNF today. CSW managing disposition to SNF.  Expected Discharge Date:  12/29/17               Expected Discharge Plan:  [place]  In-House Referral:  Nutrition, Clinical Social Work  Discharge planning Services  CM Consult   Status of Service:  Completed, signed off  If discussed at [employer] of Stay Meetings, dates discussed:    Additional Comments:  Sharin Mons, RN 12/29/2017, 1:07 PM

## 2017-12-30 ENCOUNTER — Ambulatory Visit (HOSPITAL_COMMUNITY): Payer: Medicare Other

## 2017-12-30 ENCOUNTER — Other Ambulatory Visit (HOSPITAL_COMMUNITY): Payer: Medicare Other

## 2017-12-31 DIAGNOSIS — E876 Hypokalemia: Secondary | ICD-10-CM | POA: Diagnosis not present

## 2017-12-31 DIAGNOSIS — N2581 Secondary hyperparathyroidism of renal origin: Secondary | ICD-10-CM | POA: Diagnosis not present

## 2017-12-31 DIAGNOSIS — D689 Coagulation defect, unspecified: Secondary | ICD-10-CM | POA: Diagnosis not present

## 2017-12-31 DIAGNOSIS — D631 Anemia in chronic kidney disease: Secondary | ICD-10-CM | POA: Diagnosis not present

## 2017-12-31 DIAGNOSIS — E039 Hypothyroidism, unspecified: Secondary | ICD-10-CM | POA: Diagnosis not present

## 2017-12-31 DIAGNOSIS — N186 End stage renal disease: Secondary | ICD-10-CM | POA: Diagnosis not present

## 2017-12-31 DIAGNOSIS — Z23 Encounter for immunization: Secondary | ICD-10-CM | POA: Diagnosis not present

## 2017-12-31 DIAGNOSIS — E1129 Type 2 diabetes mellitus with other diabetic kidney complication: Secondary | ICD-10-CM | POA: Diagnosis not present

## 2018-01-02 DIAGNOSIS — E876 Hypokalemia: Secondary | ICD-10-CM | POA: Diagnosis not present

## 2018-01-02 DIAGNOSIS — E039 Hypothyroidism, unspecified: Secondary | ICD-10-CM | POA: Diagnosis not present

## 2018-01-02 DIAGNOSIS — D689 Coagulation defect, unspecified: Secondary | ICD-10-CM | POA: Diagnosis not present

## 2018-01-02 DIAGNOSIS — N186 End stage renal disease: Secondary | ICD-10-CM | POA: Diagnosis not present

## 2018-01-02 DIAGNOSIS — E1129 Type 2 diabetes mellitus with other diabetic kidney complication: Secondary | ICD-10-CM | POA: Diagnosis not present

## 2018-01-02 DIAGNOSIS — Z23 Encounter for immunization: Secondary | ICD-10-CM | POA: Diagnosis not present

## 2018-01-02 DIAGNOSIS — N2581 Secondary hyperparathyroidism of renal origin: Secondary | ICD-10-CM | POA: Diagnosis not present

## 2018-01-02 DIAGNOSIS — D631 Anemia in chronic kidney disease: Secondary | ICD-10-CM | POA: Diagnosis not present

## 2018-01-04 DIAGNOSIS — M329 Systemic lupus erythematosus, unspecified: Secondary | ICD-10-CM | POA: Diagnosis not present

## 2018-01-04 DIAGNOSIS — I70232 Atherosclerosis of native arteries of right leg with ulceration of calf: Secondary | ICD-10-CM | POA: Diagnosis not present

## 2018-01-04 DIAGNOSIS — L03115 Cellulitis of right lower limb: Secondary | ICD-10-CM | POA: Diagnosis not present

## 2018-01-04 DIAGNOSIS — E119 Type 2 diabetes mellitus without complications: Secondary | ICD-10-CM | POA: Diagnosis not present

## 2018-01-04 DIAGNOSIS — I4891 Unspecified atrial fibrillation: Secondary | ICD-10-CM | POA: Diagnosis not present

## 2018-01-05 DIAGNOSIS — Z23 Encounter for immunization: Secondary | ICD-10-CM | POA: Diagnosis not present

## 2018-01-05 DIAGNOSIS — E785 Hyperlipidemia, unspecified: Secondary | ICD-10-CM | POA: Diagnosis not present

## 2018-01-05 DIAGNOSIS — N2581 Secondary hyperparathyroidism of renal origin: Secondary | ICD-10-CM | POA: Diagnosis not present

## 2018-01-05 DIAGNOSIS — D689 Coagulation defect, unspecified: Secondary | ICD-10-CM | POA: Diagnosis not present

## 2018-01-05 DIAGNOSIS — E876 Hypokalemia: Secondary | ICD-10-CM | POA: Diagnosis not present

## 2018-01-05 DIAGNOSIS — M109 Gout, unspecified: Secondary | ICD-10-CM | POA: Diagnosis not present

## 2018-01-05 DIAGNOSIS — J449 Chronic obstructive pulmonary disease, unspecified: Secondary | ICD-10-CM | POA: Diagnosis not present

## 2018-01-05 DIAGNOSIS — I4891 Unspecified atrial fibrillation: Secondary | ICD-10-CM | POA: Diagnosis not present

## 2018-01-05 DIAGNOSIS — E1129 Type 2 diabetes mellitus with other diabetic kidney complication: Secondary | ICD-10-CM | POA: Diagnosis not present

## 2018-01-05 DIAGNOSIS — N186 End stage renal disease: Secondary | ICD-10-CM | POA: Diagnosis not present

## 2018-01-05 DIAGNOSIS — D631 Anemia in chronic kidney disease: Secondary | ICD-10-CM | POA: Diagnosis not present

## 2018-01-05 DIAGNOSIS — E039 Hypothyroidism, unspecified: Secondary | ICD-10-CM | POA: Diagnosis not present

## 2018-01-07 ENCOUNTER — Other Ambulatory Visit: Payer: Self-pay

## 2018-01-07 ENCOUNTER — Encounter: Payer: Self-pay | Admitting: *Deleted

## 2018-01-07 ENCOUNTER — Other Ambulatory Visit: Payer: Self-pay | Admitting: *Deleted

## 2018-01-07 ENCOUNTER — Encounter: Payer: Self-pay | Admitting: Vascular Surgery

## 2018-01-07 ENCOUNTER — Ambulatory Visit: Payer: Medicare Other | Admitting: Vascular Surgery

## 2018-01-07 VITALS — BP 150/49 | HR 74 | Temp 97.2°F | Resp 18 | Ht 65.0 in | Wt 167.0 lb

## 2018-01-07 DIAGNOSIS — Z992 Dependence on renal dialysis: Secondary | ICD-10-CM | POA: Diagnosis not present

## 2018-01-07 DIAGNOSIS — Z23 Encounter for immunization: Secondary | ICD-10-CM | POA: Diagnosis not present

## 2018-01-07 DIAGNOSIS — D631 Anemia in chronic kidney disease: Secondary | ICD-10-CM | POA: Diagnosis not present

## 2018-01-07 DIAGNOSIS — E876 Hypokalemia: Secondary | ICD-10-CM | POA: Diagnosis not present

## 2018-01-07 DIAGNOSIS — E039 Hypothyroidism, unspecified: Secondary | ICD-10-CM | POA: Diagnosis not present

## 2018-01-07 DIAGNOSIS — D689 Coagulation defect, unspecified: Secondary | ICD-10-CM | POA: Diagnosis not present

## 2018-01-07 DIAGNOSIS — N2581 Secondary hyperparathyroidism of renal origin: Secondary | ICD-10-CM | POA: Diagnosis not present

## 2018-01-07 DIAGNOSIS — E1129 Type 2 diabetes mellitus with other diabetic kidney complication: Secondary | ICD-10-CM | POA: Diagnosis not present

## 2018-01-07 DIAGNOSIS — N186 End stage renal disease: Secondary | ICD-10-CM

## 2018-01-08 NOTE — Progress Notes (Signed)
Patient is a 72 year old female who returns for follow-up today.  She has previously undergone a first stage left basilic vein transposition fistula by Dr. Donnetta Hutching in 2015.  She never underwent a second stage procedure.  She is also being evaluated for a large calf wound.  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  Recent showed no significant arterial obstruction.  He denies any numbness or tingling in her left hand.  Review of systems: Patient states that the ulcer in her right calf is still painful.  She is minimally ambulatory.  She is currently residing in a skilled nursing facility.  She is on dialysis Tuesday Thursday Saturday.  She takes Eliquis for fibrillation.  Other chronic medical problems include anemia arthritis asthma congestive heart failure COPD diabetes.  Past Medical History:  Diagnosis Date  . Anemia   . Arthritis   . Asthma   . Atrial fibrillation ([place])   . Atrial flutter [facility])    TEE/DCCV December 2013 - on Xarelto [facility])  . Bone spur of toe    Right 5th toe  . Chronic diastolic CHF (congestive heart failure) (HCC)    a. EF 50-55% by echo in 2015  . CKD (chronic kidney disease) stage 4, GFR 15-29 ml/min (HCC)    a. s/p fistula placement but not on HD. Followed by Nephrology.   . Colon polyposis   . COPD (chronic obstructive pulmonary disease) ([place])   . DVT of upper extremity (deep vein thrombosis) ([place])    Right basilic vein, June 1610  . Essential hypertension, benign   . Fractures   . Glaucoma   . SLE (systemic lupus erythematosus) ([place])   . Type 2 diabetes mellitus ([place])   . UTI (lower urinary tract infection)     Current Outpatient Medications on File Prior to Visit  Medication Sig Dispense Refill  . acetaminophen (TYLENOL) 500 MG tablet Take 1,000 mg by mouth every 6 (six) hours as needed. For pain    . albuterol (ACCUNEB) 0.63 MG/3ML nebulizer solution Take 1 ampule by nebulization every 6 (six) hours as needed for wheezing or shortness of breath.      Marland Kitchen albuterol (PROVENTIL HFA;VENTOLIN HFA) 108 (90 BASE) MCG/ACT inhaler Inhale 2 puffs into the lungs every 6 (six) hours as needed. For shortness of breath    . Amino Acids-Protein Hydrolys (FEEDING SUPPLEMENT, PRO-STAT SUGAR FREE 64,) LIQD Take 30 mLs by mouth 2 (two) times daily. 900 mL 0  . apixaban (ELIQUIS) 5 MG TABS tablet Take 1 tablet (5 mg total) by mouth 2 (two) times daily. 60 tablet 6  . atorvastatin (LIPITOR) 20 MG tablet Take 20 mg by mouth daily.    . brimonidine-timolol (COMBIGAN) 0.2-0.5 % ophthalmic solution Place 1 drop into the left eye daily.     . diclofenac (VOLTAREN) 0.1 % ophthalmic solution Place 1 drop into the left eye daily. Uses at lunch time    . dicyclomine (BENTYL) 10 MG capsule Take 10 mg by mouth 4 (four) times daily -  before meals and at bedtime.    . febuxostat (ULORIC) 40 MG tablet Take 40 mg by mouth daily.    . folic acid (FOLVITE) 1 MG tablet Take 1 mg by mouth daily.    . hydroxychloroquine (PLAQUENIL) 200 MG tablet Take 300 mg by mouth as directed. Take 1 1/2 tablets daily    . insulin aspart (NOVOLOG) 100 UNIT/ML injection Inject 0-9 Units into the skin 3 (three) times daily with meals. 10 mL 11  .  metoprolol tartrate (LOPRESSOR) 50 MG tablet Take 50 mg by mouth 2 (two) times daily. Take 1 tablet twice a day, except on Tuesday, Thursday, Saturday, take 1 tablet daily in the morning.    . Multiple Vitamins-Minerals (CENTRUM SILVER PO) Take 1 tablet by mouth daily.    . Nutritional Supplements (FEEDING SUPPLEMENT, NEPRO CARB STEADY,) LIQD Take 237 mLs by mouth daily. 1 Can 0  . predniSONE (DELTASONE) 5 MG tablet Take 5 mg by mouth daily with breakfast.    . travoprost, benzalkonium, (TRAVATAN) 0.004 % ophthalmic solution Place 1 drop into the left eye at bedtime.     No current facility-administered medications on file prior to visit.     Physical exam:  Vitals:   01/07/18 0849 01/07/18 0850  BP: (!) 152/48 (!) 150/49  Pulse: 74   Resp: 18    Temp: (!) 97.2 F (36.2 C)   TempSrc: Oral   SpO2: 99%   Weight: 167 lb (75.8 kg)   Height: 5\' 5"  (1.651 m)     Extremities: Large right calf wound measuring 14 cm x 5 cm with a large area of dark eschar.  Left upper extremity palpable thrill in fistula no palpable radial pulse  Neck: Right side dialysis catheter  Assessment: As far as the patient's lower extremity ulcer is concerned conservative measures with continued local wound care are her only option.  If she develops unrelenting pain or a sending infection the only seizure she would qualify for would be an amputation.  She is not currently interested in this.  As far as her fistula is concerned we will schedule her in the near future for second stage basilic vein transposition.  I did discuss with her today that she is at risk for forming nonhealing wounds and calciphylaxis type ulcerations in the incisions.  She understands and agrees to proceed.  Other risks benefits of possible complications include bleeding infection by maturation of the fistula fistula thrombosis.  We will stop her Eliquis perioperatively  [name], MD [facility] Office: [phone] Pager: [phone]

## 2018-01-08 NOTE — H&P (View-Only) (Signed)
Patient is a 72 year old female who returns for follow-up today.  She has previously undergone a first stage left basilic vein transposition fistula by Dr. Donnetta Hutching in 2015.  She never underwent a second stage procedure.  She is also being evaluated for a large calf wound.  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  Recent showed no significant arterial obstruction.  He denies any numbness or tingling in her left hand.  Review of systems: Patient states that the ulcer in her right calf is still painful.  She is minimally ambulatory.  She is currently residing in a skilled nursing facility.  She is on dialysis Tuesday Thursday Saturday.  She takes Eliquis for fibrillation.  Other chronic medical problems include anemia arthritis asthma congestive heart failure COPD diabetes.  Past Medical History:  Diagnosis Date  . Anemia   . Arthritis   . Asthma   . Atrial fibrillation ([place])   . Atrial flutter [facility])    TEE/DCCV December 2013 - on Xarelto [facility])  . Bone spur of toe    Right 5th toe  . Chronic diastolic CHF (congestive heart failure) (HCC)    a. EF 50-55% by echo in 2015  . CKD (chronic kidney disease) stage 4, GFR 15-29 ml/min (HCC)    a. s/p fistula placement but not on HD. Followed by Nephrology.   . Colon polyposis   . COPD (chronic obstructive pulmonary disease) ([place])   . DVT of upper extremity (deep vein thrombosis) ([place])    Right basilic vein, June 4098  . Essential hypertension, benign   . Fractures   . Glaucoma   . SLE (systemic lupus erythematosus) ([place])   . Type 2 diabetes mellitus ([place])   . UTI (lower urinary tract infection)     Current Outpatient Medications on File Prior to Visit  Medication Sig Dispense Refill  . acetaminophen (TYLENOL) 500 MG tablet Take 1,000 mg by mouth every 6 (six) hours as needed. For pain    . albuterol (ACCUNEB) 0.63 MG/3ML nebulizer solution Take 1 ampule by nebulization every 6 (six) hours as needed for wheezing or shortness of breath.      Marland Kitchen albuterol (PROVENTIL HFA;VENTOLIN HFA) 108 (90 BASE) MCG/ACT inhaler Inhale 2 puffs into the lungs every 6 (six) hours as needed. For shortness of breath    . Amino Acids-Protein Hydrolys (FEEDING SUPPLEMENT, PRO-STAT SUGAR FREE 64,) LIQD Take 30 mLs by mouth 2 (two) times daily. 900 mL 0  . apixaban (ELIQUIS) 5 MG TABS tablet Take 1 tablet (5 mg total) by mouth 2 (two) times daily. 60 tablet 6  . atorvastatin (LIPITOR) 20 MG tablet Take 20 mg by mouth daily.    . brimonidine-timolol (COMBIGAN) 0.2-0.5 % ophthalmic solution Place 1 drop into the left eye daily.     . diclofenac (VOLTAREN) 0.1 % ophthalmic solution Place 1 drop into the left eye daily. Uses at lunch time    . dicyclomine (BENTYL) 10 MG capsule Take 10 mg by mouth 4 (four) times daily -  before meals and at bedtime.    . febuxostat (ULORIC) 40 MG tablet Take 40 mg by mouth daily.    . folic acid (FOLVITE) 1 MG tablet Take 1 mg by mouth daily.    . hydroxychloroquine (PLAQUENIL) 200 MG tablet Take 300 mg by mouth as directed. Take 1 1/2 tablets daily    . insulin aspart (NOVOLOG) 100 UNIT/ML injection Inject 0-9 Units into the skin 3 (three) times daily with meals. 10 mL 11  .  metoprolol tartrate (LOPRESSOR) 50 MG tablet Take 50 mg by mouth 2 (two) times daily. Take 1 tablet twice a day, except on Tuesday, Thursday, Saturday, take 1 tablet daily in the morning.    . Multiple Vitamins-Minerals (CENTRUM SILVER PO) Take 1 tablet by mouth daily.    . Nutritional Supplements (FEEDING SUPPLEMENT, NEPRO CARB STEADY,) LIQD Take 237 mLs by mouth daily. 1 Can 0  . predniSONE (DELTASONE) 5 MG tablet Take 5 mg by mouth daily with breakfast.    . travoprost, benzalkonium, (TRAVATAN) 0.004 % ophthalmic solution Place 1 drop into the left eye at bedtime.     No current facility-administered medications on file prior to visit.     Physical exam:  Vitals:   01/07/18 0849 01/07/18 0850  BP: (!) 152/48 (!) 150/49  Pulse: 74   Resp: 18    Temp: (!) 97.2 F (36.2 C)   TempSrc: Oral   SpO2: 99%   Weight: 167 lb (75.8 kg)   Height: 5\' 5"  (1.651 m)     Extremities: Large right calf wound measuring 14 cm x 5 cm with a large area of dark eschar.  Left upper extremity palpable thrill in fistula no palpable radial pulse  Neck: Right side dialysis catheter  Assessment: As far as the patient's lower extremity ulcer is concerned conservative measures with continued local wound care are her only option.  If she develops unrelenting pain or a sending infection the only seizure she would qualify for would be an amputation.  She is not currently interested in this.  As far as her fistula is concerned we will schedule her in the near future for second stage basilic vein transposition.  I did discuss with her today that she is at risk for forming nonhealing wounds and calciphylaxis type ulcerations in the incisions.  She understands and agrees to proceed.  Other risks benefits of possible complications include bleeding infection by maturation of the fistula fistula thrombosis.  We will stop her Eliquis perioperatively  [name], MD [facility] [place] Office: [phone] Pager: [phone]

## 2018-01-09 DIAGNOSIS — D631 Anemia in chronic kidney disease: Secondary | ICD-10-CM | POA: Diagnosis not present

## 2018-01-09 DIAGNOSIS — N2581 Secondary hyperparathyroidism of renal origin: Secondary | ICD-10-CM | POA: Diagnosis not present

## 2018-01-09 DIAGNOSIS — D689 Coagulation defect, unspecified: Secondary | ICD-10-CM | POA: Diagnosis not present

## 2018-01-09 DIAGNOSIS — N186 End stage renal disease: Secondary | ICD-10-CM | POA: Diagnosis not present

## 2018-01-09 DIAGNOSIS — Z23 Encounter for immunization: Secondary | ICD-10-CM | POA: Diagnosis not present

## 2018-01-09 DIAGNOSIS — E1129 Type 2 diabetes mellitus with other diabetic kidney complication: Secondary | ICD-10-CM | POA: Diagnosis not present

## 2018-01-09 DIAGNOSIS — E039 Hypothyroidism, unspecified: Secondary | ICD-10-CM | POA: Diagnosis not present

## 2018-01-09 DIAGNOSIS — E876 Hypokalemia: Secondary | ICD-10-CM | POA: Diagnosis not present

## 2018-01-11 ENCOUNTER — Ambulatory Visit: Payer: Medicare Other | Admitting: Gastroenterology

## 2018-01-11 ENCOUNTER — Encounter: Payer: Self-pay | Admitting: Gastroenterology

## 2018-01-11 VITALS — BP 128/76 | HR 81 | Temp 97.0°F | Ht 64.0 in | Wt 179.0 lb

## 2018-01-11 DIAGNOSIS — R300 Dysuria: Secondary | ICD-10-CM | POA: Diagnosis not present

## 2018-01-11 DIAGNOSIS — L03115 Cellulitis of right lower limb: Secondary | ICD-10-CM | POA: Diagnosis not present

## 2018-01-11 DIAGNOSIS — R1031 Right lower quadrant pain: Secondary | ICD-10-CM | POA: Diagnosis not present

## 2018-01-11 DIAGNOSIS — G8929 Other chronic pain: Secondary | ICD-10-CM

## 2018-01-11 DIAGNOSIS — E119 Type 2 diabetes mellitus without complications: Secondary | ICD-10-CM | POA: Diagnosis not present

## 2018-01-11 DIAGNOSIS — R195 Other fecal abnormalities: Secondary | ICD-10-CM

## 2018-01-11 DIAGNOSIS — D509 Iron deficiency anemia, unspecified: Secondary | ICD-10-CM

## 2018-01-11 NOTE — Progress Notes (Signed)
cc'ed to pcp °

## 2018-01-11 NOTE — Progress Notes (Signed)
Primary Care Physician:  Sharilyn Sites, MD  Primary Gastroenterologist:  Garfield Cornea, MD   Chief Complaint  Patient presents with  . heme + stool  . Abdominal Pain    right side pain x 6 months, constant pain    HPI:  [name] is a 72 y.o. female with history of lupus, A. fib previously on Coumadin but recently switched to Eliquis, COPD, diabetes mellitus, IDA, end-stage renal disease, recently began hemodialysis this year who presented to the emergency department a few weeks ago with right leg ulcer.  Empiric antibiotics were not helping.  Vascular surgery consulted, duplex ultrasounds showed no evidence of stenosis.  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  There is some question whether it was warfarin-induced necrosis of the skin.  Warfarin was stopped and she was put on Eliquis.  Patient was hospitalized back in February as well with upper respiratory infection.  At that point she did go to rehabilitation at [facility] but after her recent hospitalization she is now at [facility].   Appointment was made here due to chronic abdominal pain, unclear etiology as well as heme positive stools found during ED visit on 11/21/17.  Last seen in September 2017, history of heme positive stool. She has been followed for anemia in chronic renal disease. Hgb has been slowly drifting in the setting of chronic Coumadin, with history of an upper extremity DVT in June 2015, history of aflutter. On Prednisone chronically. Receives Aranesp injections. Last colonoscopy/EGD in 2013 by Dr. [name], performed due to anemia while hospitalized, noting multiple tubular adenomas and EGD with duodenal erosion and possible resolving ulcer at the angularis. In Jan 2017, whereby a colonoscopy +/- EGD was arranged due to heme positive stool. They cancelled this due to concern for not eating  prior to the colonoscopy as well as bathroom being upstairs, etc. She did finally have TCS back in 06/2016 at which time she had multiple colonic polyps removed which were tubular adenomas.  She was advised to come back in 3 years for follow-up.  Patient states she has been having right lower quadrant abdominal pain for more than 6 months.  Husband says that she has had at least 6 CT scans to evaluate this pain and nothing has been found.  She reports the pain comes and goes but she has it every day.  Nothing seems to make it worse.  Not affected by meals or bowel function.  Sometimes it eases off if she is able to lay down.  Currently deconditioned with limited mobility.  Denies melena or rectal bleeding.  She states her bowel function is regular.  Denies heartburn, dysphagia, vomiting.  Patient's hemoglobin is 7.8, hematocrit 26, MCV 94.2 on 12/29/17. Back in 10/2017  TIBC 104, iron 12, ferritin elevated in setting of ESRD/HD.  Hemoglobin consistently in the 8 range since mid February however prior to that her hemoglobin had been in the 10-11 range in early February.  She does have a history of requiring Aranesp, IV iron infusions in the past. Previously followed by oncology but now on dialysis she is having her anemia managed by nephrology. It appears her last aranesp inj was in 09/2017.   Abdominal ultrasound on 11/03/2017 showed gallbladder sludge but no evidence of acute cholecystitis.  CBD of 0.4 cm.  She had an indeterminate right renal lesion measuring up to 2.8 cm, could represent  complex cystic lesion but solid component cannot be excluded.  Not well characterized on prior noncontrast CTs.  CT abdomen pelvis without contrast on 11/02/2017.  Pneumonia, left lung base, sludge in the gallbladder.  Advanced aortic atherosclerosis.  CT renal protocol 08/31/2017 with stable tiny nodular foci in the kidneys and the liver, nonspecific, extensive atherosclerotic disease with significant plaque at the origin of  the celiac artery.  Eliquis currently on hold due to upcoming vascular surgery on 01/13/18  Current Outpatient Medications  Medication Sig Dispense Refill  . acetaminophen (TYLENOL) 500 MG tablet Take 1,000 mg by mouth every 6 (six) hours as needed. For pain    . albuterol (ACCUNEB) 0.63 MG/3ML nebulizer solution Take 1 ampule by nebulization every 6 (six) hours as needed for wheezing or shortness of breath.     Marland Kitchen albuterol (PROVENTIL HFA;VENTOLIN HFA) 108 (90 BASE) MCG/ACT inhaler Inhale 2 puffs into the lungs every 6 (six) hours as needed. For shortness of breath    . Amino Acids-Protein Hydrolys (FEEDING SUPPLEMENT, PRO-STAT SUGAR FREE 64,) LIQD Take 30 mLs by mouth 2 (two) times daily. 900 mL 0  . apixaban (ELIQUIS) 5 MG TABS tablet Take 1 tablet (5 mg total) by mouth 2 (two) times daily. 60 tablet 6  . atorvastatin (LIPITOR) 20 MG tablet Take 20 mg by mouth daily.    . brimonidine-timolol (COMBIGAN) 0.2-0.5 % ophthalmic solution Place 1 drop into the left eye daily.     . diclofenac (VOLTAREN) 0.1 % ophthalmic solution Place 1 drop into the left eye daily. Uses at lunch time    . dicyclomine (BENTYL) 10 MG capsule Take 10 mg by mouth 4 (four) times daily -  before meals and at bedtime.    . febuxostat (ULORIC) 40 MG tablet Take 40 mg by mouth daily.    . folic acid (FOLVITE) 1 MG tablet Take 1 mg by mouth daily.    . hydroxychloroquine (PLAQUENIL) 200 MG tablet Take 300 mg by mouth as directed. Take 1 1/2 tablets daily    . insulin aspart (NOVOLOG) 100 UNIT/ML injection Inject 0-9 Units into the skin 3 (three) times daily with meals. 10 mL 11  . metoprolol tartrate (LOPRESSOR) 50 MG tablet Take 50 mg by mouth 2 (two) times daily. Take 1 tablet twice a day, except on Tuesday, Thursday, Saturday, take 1 tablet daily in the morning.    . Multiple Vitamins-Minerals (CENTRUM SILVER PO) Take 1 tablet by mouth daily.    . Nutritional Supplements (FEEDING SUPPLEMENT, NEPRO CARB STEADY,) LIQD Take  237 mLs by mouth daily. 1 Can 0  . predniSONE (DELTASONE) 5 MG tablet Take 5 mg by mouth daily with breakfast.    . travoprost, benzalkonium, (TRAVATAN) 0.004 % ophthalmic solution Place 1 drop into the left eye at bedtime.     No current facility-administered medications for this visit.     Allergies as of 01/11/2018 - Review Complete 01/11/2018  Allergen Reaction Noted  . Ace inhibitors Cough 06/15/2015    Past Medical History:  Diagnosis Date  . Anemia   . Arthritis   . Asthma   . Atrial fibrillation ([place])   . Atrial flutter [facility])    TEE/DCCV December 2013 - on Xarelto [facility])  . Bone spur of toe    Right 5th toe  . Chronic diastolic CHF (congestive heart failure) (HCC)    a. EF 50-55% by echo in 2015  . CKD (chronic kidney disease) stage 4, GFR 15-29 ml/min (HCC)  a. s/p fistula placement but not on HD. Followed by Nephrology.   . Colon polyposis   . COPD (chronic obstructive pulmonary disease) ([place])   . DVT of upper extremity (deep vein thrombosis) ([place])    Right basilic vein, June 0272  . Essential hypertension, benign   . Fractures   . Glaucoma   . SLE (systemic lupus erythematosus) ([place])   . Type 2 diabetes mellitus ([place])   . UTI (lower urinary tract infection)     Past Surgical History:  Procedure Laterality Date  . ABDOMINAL AORTOGRAM W/LOWER EXTREMITY N/A 12/18/2017   Procedure: ABDOMINAL AORTOGRAM W/LOWER EXTREMITY;  Surgeon: Elam Dutch, MD;  Location: [place] CV LAB;  Service: Cardiovascular;  Laterality: N/A;  bilateral  . ABDOMINAL HYSTERECTOMY  1983  . ANKLE SURGERY  1993  . AV FISTULA PLACEMENT Left 03/27/2014   Procedure: ARTERIOVENOUS (AV) FISTULA CREATION;  Surgeon: Rosetta Posner, MD;  Location: [place];  Service: Vascular;  Laterality: Left;  . BACK SURGERY  1980  . CARDIOVASCULAR STRESS TEST  12/19/2009   no stress induced rhythm abnormalities, ekg negative for ischemia  . CARDIOVERSION  09/16/2012   Procedure: CARDIOVERSION;  Surgeon:  Sanda Klein, MD;  Location: [facility] ENDOSCOPY;  Service: Cardiovascular;  Laterality: N/A;  . COLONOSCOPY  09/20/2012   Dr. [name]: multiple tubular adenomas   . COLONOSCOPY  2005   Dr. [name]. Polyps, path unknown   . COLONOSCOPY N/A 07/24/2016   Procedure: COLONOSCOPY;  Surgeon: [name], MD;  [facility];  Service: Endoscopy;  Laterality: N/A;  9:00 am  . ESOPHAGOGASTRODUODENOSCOPY  09/20/2012   Dr. [name]: duodenal erosion and possible resolving ulcer at the angularis   . IR FLUORO GUIDE CV LINE RIGHT  11/09/2017  . IR US GUIDE VASC ACCESS RIGHT  11/09/2017  . TEE WITHOUT CARDIOVERSION  09/16/2012   Procedure: TRANSESOPHAGEAL ECHOCARDIOGRAM (TEE);  Surgeon: Sanda Klein, MD;  Location: [facility] ENDOSCOPY;  Service: Cardiovascular;  Laterality: N/A;  . TRANSESOPHAGEAL ECHOCARDIOGRAM WITH CARDIOVERSION  09/16/2012   EF 60-65%, moderate LVH, moderate regurg of the aortic valve, LA moderately dilated    Family History  Problem Relation Age of Onset  . Diabetes Brother   . Diabetes Father   . Hypertension Father   . Hypertension Sister   . Stroke Mother   . Diabetes Sister   . Hypertension Brother   . Colon cancer Neg Hx     Social History   Socioeconomic History  . Marital status: Married    Spouse name: Not on file  . Number of children: Not on file  . Years of education: Not on file  . Highest education level: Not on file  Occupational History  . Not on file  Social Needs  . Financial resource strain: Not on file  . Food insecurity:    Worry: Not on file    Inability: Not on file  . Transportation needs:    Medical: Not on file    Non-medical: Not on file  Tobacco Use  . Smoking status: Former Smoker    Packs/day: 1.00    Years: 30.00    Pack years: 30.00    Types: Cigarettes    Last attempt to quit: 08/29/2012    Years since quitting: 5.3  . Smokeless tobacco: Never Used  . Tobacco comment: quit 08-2012  Substance and Sexual Activity  . Alcohol  use: No    Alcohol/week: 0.0 oz  . Drug use: No  . Sexual activity:  Yes    Partners: Male  Lifestyle  . Physical activity:    Days per week: Not on file    Minutes per session: Not on file  . Stress: Not on file  Relationships  . Social connections:    Talks on phone: Not on file    Gets together: Not on file    Attends religious service: Not on file    Active member of club or organization: Not on file    Attends meetings of clubs or organizations: Not on file    Relationship status: Not on file  . Intimate partner violence:    Fear of current or ex partner: Not on file    Emotionally abused: Not on file    Physically abused: Not on file    Forced sexual activity: Not on file  Other Topics Concern  . Not on file  Social History Narrative  . Not on file      ROS:  General: Negative for anorexia, weight loss, fever, chills, fatigue,+ weakness. Eyes: Negative for vision changes.  ENT: Negative for hoarseness, difficulty swallowing , nasal congestion. CV: Negative for chest pain, angina, palpitations, dyspnea on exertion, peripheral edema.  Respiratory: Negative for dyspnea at rest, dyspnea on exertion, cough, sputum, wheezing.  GI: See history of present illness. GU:  Negative for dysuria, hematuria, urinary incontinence, urinary frequency, nocturnal urination.  MS: Negative for joint pain, low back pain.  Derm: Negative for rash or itching. Right lower ext ulcers Neuro: Negative for weakness, abnormal sensation, seizure, frequent headaches, memory loss, confusion.  Psych: Negative for anxiety, depression, suicidal ideation, hallucinations.  Endo: Negative for unusual weight change.  Heme: Negative for bruising or bleeding. Allergy: Negative for rash or hives.    Physical Examination:  BP 128/76   Pulse 81   Temp (!) 97 F (36.1 C) (Oral)   Ht 5\' 4"  (1.626 m)   Wt 179 lb (81.2 kg)   BMI 30.73 kg/m    General: chronically ill-appearing female accompanied by  spouse. in no acute distress.  Head: Normocephalic, atraumatic.   Eyes: Conjunctiva pale, no icterus. Mouth: Oropharyngeal mucosa moist and pink , no lesions erythema or exudate. Neck: Supple without thyromegaly, masses, or lymphadenopathy.  Lungs: Clear to auscultation bilaterally.  Heart: Regular rate and rhythm, no murmurs rubs or gallops.  Abdomen: Bowel sounds are normal, tender in RLQ with deep palpation but no rebound or guarding.  nondistended, no hepatosplenomegaly or masses, no abdominal bruits or hernia    Rectal: not performed Extremities: No lower extremity edema. No clubbing. Large ulceration noted on anterior and lateral right lower extremity with significant scarring/deformity as well.   Neuro: Alert and oriented x 4 , grossly normal neurologically.  Skin: Warm and dry, no rash or jaundice.   Psych: Alert and cooperative, normal mood and affect.  Labs: Lab Results  Component Value Date   WBC 7.9 12/29/2017   HGB 7.8 (L) 12/29/2017   HCT 26.0 (L) 12/29/2017   MCV 94.2 12/29/2017   PLT 290 12/29/2017   Lab Results  Component Value Date   CREATININE 4.56 (H) 12/29/2017   BUN 74 (H) 12/29/2017   NA 133 (L) 12/29/2017   K 3.3 (L) 12/29/2017   CL 96 (L) 12/29/2017   CO2 25 12/29/2017   Lab Results  Component Value Date   ALT 18 12/14/2017   AST 21 12/14/2017   ALKPHOS 76 12/14/2017   BILITOT 0.4 12/14/2017   Lab Results  Component Value Date  LIPASE 20 11/02/2017   Lab Results  Component Value Date   IRON 12 (L) 11/03/2017   TIBC 104 (L) 11/03/2017   FERRITIN 752 (H) 11/03/2017   Lab Results  Component Value Date   VITAMINB12 3,030 (H) 12/23/2017   Lab Results  Component Value Date   FOLATE >100.0 10/21/2017     Imaging Studies: Dg Chest 2 View  Result Date: 12/14/2017 CLINICAL DATA:  History of leg wound EXAM: CHEST - 2 VIEW COMPARISON:  11/03/2017 FINDINGS: Cardiac shadow is mildly prominent but likely related to the underlying dialysis  state. Dialysis catheter is noted on the right and stable. The lungs are well aerated bilaterally without focal infiltrate or sizable effusion. No bony abnormality is seen. IMPRESSION: No active cardiopulmonary disease. Electronically Signed   By: Inez Catalina M.D.   On: 12/14/2017 16:19   Dg Tibia/fibula Right  Result Date: 12/14/2017 CLINICAL DATA:  Right leg ulcer EXAM: RIGHT TIBIA AND FIBULA - 2 VIEW COMPARISON:  11/02/2017 FINDINGS: No acute displaced fracture or malalignment. Fixating screw in the distal fibula with old fibular shaft fracture deformity. Soft tissue ulcer defect posterior mid lower leg. No soft tissue gas. Vascular calcifications and soft tissue calcifications. IMPRESSION: 1. No acute osseous abnormality 2. Old fracture deformity of the distal fibula 3. Small ulcer posterior soft tissues of the mid lower leg. Electronically Signed   By: Donavan Foil M.D.   On: 12/14/2017 17:08   Mr Brain Wo Contrast  Result Date: 12/26/2017 CLINICAL DATA:  72 y/o  F; hallucinations, history of lupus. EXAM: MRI HEAD WITHOUT CONTRAST TECHNIQUE: Multiplanar, multiecho pulse sequences of the brain and surrounding structures were obtained without intravenous contrast. COMPARISON:  None. FINDINGS: Brain: No acute infarction, hemorrhage, hydrocephalus, extra-axial collection or mass lesion. Small chronic lacunar infarcts within bilateral thalamus and right pons. Fewnonspecific foci of T2 FLAIR hyperintense signal abnormality in subcortical and periventricular white matter are compatible withmildchronic microvascular ischemic changes for age. Moderate diffuse supratentorial and infratentorialbrain parenchymal volume loss. Nonspecific punctate foci of susceptibility hypointensity are present in the left putamen and left frontal white matter compatible with hemosiderin deposition of chronic microhemorrhage. Vascular: Normal flow voids. Skull and upper cervical spine: Normal marrow signal. Sinuses/Orbits: Mild  ethmoid and maxillary sinus mucosal thickening. No abnormal signal of mastoid air cells. Orbits are unremarkable. Other: None. IMPRESSION: 1. No acute process or structural abnormality of the brain identified. 2. Mild chronic microvascular ischemic changes and moderate diffuse parenchymal volume loss of the brain. Small chronic lacunar infarcts in basal ganglia and right pons. No specific findings of vasculitis. Electronically Signed   By: Kristine Garbe M.D.   On: 12/26/2017 01:10   Mr Foot Right Wo Contrast  Result Date: 12/15/2017 CLINICAL DATA:  Skin ulcerations since the fall of 2018 on the right second and third toes in a diabetic patient with end-stage renal disease. EXAM: MRI OF THE RIGHT FOREFOOT WITHOUT CONTRAST TECHNIQUE: Multiplanar, multisequence MR imaging of the right forefoot was performed. No intravenous contrast was administered. COMPARISON:  Plain films right foot 12/14/2017. FINDINGS: This exam is degraded by patient motion. Bones/Joint/Cartilage No bone marrow edema to suggest osteomyelitis is identified. No fracture is seen. There is some degenerative change about the first MTP joint. Ligaments Intact. Muscles and Tendons Mild intrasubstance increased T2 signal in intrinsic musculature is most consistent with diabetic myopathy. No intramuscular fluid collection. Soft tissues Negative. The patient's skin ulcerations are not visible likely due to motion. IMPRESSION: Negative for abscess or osteomyelitis. Electronically  Signed   By: Inge Rise M.D.   On: 12/15/2017 08:36   Dg Abd Portable 1v  Result Date: 12/16/2017 CLINICAL DATA:  Lower abdominal pain for several weeks EXAM: PORTABLE ABDOMEN - 1 VIEW COMPARISON:  11/02/2017 FINDINGS: Scattered large and small bowel gas is noted. Mild retained fecal material is seen. No obstructive changes are noted. No free air is noted. Degenerative changes of the lumbar spine are again seen. IMPRESSION: No acute abnormality noted.  Electronically Signed   By: Inez Catalina M.D.   On: 12/16/2017 15:05   Dg Foot Complete Right  Result Date: 12/14/2017 CLINICAL DATA:  Blister at right middle toe for 1 month EXAM: RIGHT FOOT COMPLETE - 3+ VIEW COMPARISON:  07/10/2016 FINDINGS: No acute displaced fracture or malalignment. Moderate degenerative changes at the first MTP joint. Vascular calcifications. Possible tiny erosions at the distal phalanges of the second and third digits. No soft tissue gas. No radiopaque foreign body. Moderate plantar calcaneal spur with plantar soft tissue calcifications. IMPRESSION: 1. No acute fracture or malalignment 2. Possible tiny erosions at the tufts of the second and third distal phalanges. If osteomyelitis is suspected, would correlate with MRI. Electronically Signed   By: Donavan Foil M.D.   On: 12/14/2017 17:11

## 2018-01-11 NOTE — Assessment & Plan Note (Addendum)
72 year old female with multiple comorbidities as outlined above, history of chronic anemia which is multifactorial previously requiring Aranesp and IV iron injections orchestrated by hematology/oncology but now being followed solely by nephrology since she has been on hemodialysis a couple of months.  We were asked to see the patient due to anemia and Hemoccult positive stool, chronic abdominal pain.  Patient's last colonoscopy was in October 2017 she had multiple polyps removed at that time.  Advised to come back in 3 years.  She is never had an upper endoscopy.  Has had multiple CT imaging as outlined without explanation for abdominal pain.  Discussed with patient and husband today, she may require upper endoscopy as next step to evaluate Hemoccult positive stool, decline in hemoglobin.  She has multiple risk factors for occult GI bleeding in the setting of Eliquis, prednisone.  Her abdominal pain appears to be too low to be explained by gallbladder disease or peptic ulcer disease.  She does have advanced atherosclerotic disease seen on prior imaging, and UNfortunately she has not been able to have IV contrast given recent initiation of hemodialysis.  We will discuss findings further with Dr. Buford Dresser, further recommendations to follow.

## 2018-01-11 NOTE — Progress Notes (Signed)
Instructions provided to Lurline Del, LPN at [facility].   Last dose of Eliquis on 01/10/2018  Requested anesthesia review chart due to cardiac history and note from GI today.  Medication instructions noted under preop call documentation. Requested that they send any cardiac test, most recent lab work, and a MAR that has been printed after am medications are given. Also, requested that they ensure we can see last doses administered. Facility states they do not have a protocol for managing low blood sugar. The following instructions provided regarding diabetic management  . If your blood sugar is less than 70 mg/dL, you will need to treat for low blood sugar: o Do not take insulin. o Treat a low blood sugar (less than 70 mg/dL) with  cup of clear juice (cranberry or apple), 4 glucose tablets, OR glucose gel. Recheck blood sugar in 15 minutes after treatment (to make sure it is greater than 70 mg/dL). If your blood sugar is not greater than 70 mg/dL on recheck, call [phone] o  for further instructions.  . If your CBG is greater than 220 mg/dL, you may take  of your sliding scale (correction) dose of insulin.

## 2018-01-11 NOTE — Patient Instructions (Signed)
1. I will discuss your abdominal pain and anemia with Dr. [name].  Further recommendations to follow.

## 2018-01-12 DIAGNOSIS — E1129 Type 2 diabetes mellitus with other diabetic kidney complication: Secondary | ICD-10-CM | POA: Diagnosis not present

## 2018-01-12 DIAGNOSIS — D631 Anemia in chronic kidney disease: Secondary | ICD-10-CM | POA: Diagnosis not present

## 2018-01-12 DIAGNOSIS — N2581 Secondary hyperparathyroidism of renal origin: Secondary | ICD-10-CM | POA: Diagnosis not present

## 2018-01-12 DIAGNOSIS — Z23 Encounter for immunization: Secondary | ICD-10-CM | POA: Diagnosis not present

## 2018-01-12 DIAGNOSIS — E876 Hypokalemia: Secondary | ICD-10-CM | POA: Diagnosis not present

## 2018-01-12 DIAGNOSIS — N186 End stage renal disease: Secondary | ICD-10-CM | POA: Diagnosis not present

## 2018-01-12 DIAGNOSIS — D689 Coagulation defect, unspecified: Secondary | ICD-10-CM | POA: Diagnosis not present

## 2018-01-12 DIAGNOSIS — E039 Hypothyroidism, unspecified: Secondary | ICD-10-CM | POA: Diagnosis not present

## 2018-01-12 NOTE — Progress Notes (Addendum)
Anesthesia Chart Review:  Pt is a same day work up.   Pt is a 72 year old female scheduled for L basilic vein transposition second stage on 01/13/2018 with [name], MD.   - PCP is Sharilyn Sites, MD - Cardiologist is Rozann Lesches, MD. Last office visit 08/05/17 with Bernerd Pho, PA  PMH includes:  CHF, atrial fibrillation, atrial flutter, HTN, DM, DVT (2015), anemia, asthma, COPD, glaucoma, ESRD on hemodialysis, SLE. Former smoker (quit 2013). 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 Last dose eliquis 01/10/18.   Labs will be obtained day of surgery - CBC 12/29/17 showed H/H 7.8/26.0; this appears to be close to pt's baseline based on recent prior results.  - Pt saw PA with GI 01/11/18 for heme positive stool, question GI bleed; pt may need upper endoscopy in the future  CXR 12/14/17: No active cardiopulmonary disease.  EKG 11/02/17: Sinus tachycardia (100). Abnormal R-wave progression, late transition. Probable LVH.   Echo 11/03/17:  - Left ventricle: The cavity size was normal. Wall thickness was increased in a pattern of moderate LVH. Systolic function was normal. The estimated ejection fraction was in the range of 55% to 60%. Wall motion was normal; there were no regional wall motion abnormalities. Features are consistent with a pseudonormal left ventricular filling pattern, with concomitant abnormal relaxation and increased filling pressure (grade 2 diastolic dysfunction). Doppler parameters are consistent with high ventricular filling pressure. - Aortic valve: Moderately calcified annulus. Trileaflet;  mildly thickened, moderately calcified leaflets. Sclerosis without stenosis. There was moderate regurgitation. - Mitral valve: Calcified annulus. Moderately calcified leaflets. There was moderate regurgitation. - Left atrium: The atrium was severely dilated. - Right ventricle: Systolic function was moderately reduced. - Right atrium: The atrium was moderately dilated. - Tricuspid valve: Mildly thickened leaflets. There was moderate regurgitation. - Pulmonary arteries: PA peak pressure: 63 mm Hg (S). - Inferior vena cava: The vessel was dilated. The respirophasic diameter changes were blunted (< 50%), consistent with elevated central venous pressure. Estimated CVP 15 mmHg.  Carotid duplex 02/21/14:  - Minimal amount of bilateral atherosclerotic plaque, not resulting in a hemodynamically significant stenosis.  Nuclear stress test 12/19/09:  - Normal left ventricular ejection fraction of 57%. - Normal left ventricular wall motion. - Probable breast attenuation artifact at anteroseptal wall left ventricle. - No other definite myocardial perfusion defects are identified.  If labs acceptable day of surgery, I anticipate pt can proceed with surgery as scheduled.   Willeen Cass, FNP-BC [facility]/Anesthesiology Phone: [phone] 01/12/2018 9:36 AM

## 2018-01-12 NOTE — Progress Notes (Signed)
Spoke to patient on phone. I discussed time change to surgery and gave him instructions to arrive here at 1120. He sounded concerned because ht takes transportation from his rehab facility. Patient said that he may have to transfer surgery. I told him that it was okay if he came early for surgery, I mostly was updating him to make sure he knew of the time change. Told patient to call surgeons office in the morning if patient was concerned they would not be able to get here.

## 2018-01-13 ENCOUNTER — Ambulatory Visit (HOSPITAL_COMMUNITY)
Admission: RE | Admit: 2018-01-13 | Discharge: 2018-01-13 | Disposition: A | Discharge status: Home / Self Care | Payer: Medicare Other | Source: Ambulatory Visit | Attending: Vascular Surgery | Admitting: Vascular Surgery

## 2018-01-13 ENCOUNTER — Other Ambulatory Visit: Payer: Self-pay

## 2018-01-13 ENCOUNTER — Ambulatory Visit (HOSPITAL_COMMUNITY): Payer: Medicare Other | Admitting: Emergency Medicine

## 2018-01-13 ENCOUNTER — Encounter (HOSPITAL_COMMUNITY): Payer: Self-pay

## 2018-01-13 ENCOUNTER — Encounter (HOSPITAL_COMMUNITY)
Admission: RE | Disposition: A | Discharge status: Home / Self Care | Payer: Self-pay | Source: Ambulatory Visit | Attending: Vascular Surgery

## 2018-01-13 DIAGNOSIS — I5032 Chronic diastolic (congestive) heart failure: Secondary | ICD-10-CM | POA: Insufficient documentation

## 2018-01-13 DIAGNOSIS — Z794 Long term (current) use of insulin: Secondary | ICD-10-CM | POA: Insufficient documentation

## 2018-01-13 DIAGNOSIS — M1990 Unspecified osteoarthritis, unspecified site: Secondary | ICD-10-CM | POA: Insufficient documentation

## 2018-01-13 DIAGNOSIS — D631 Anemia in chronic kidney disease: Secondary | ICD-10-CM | POA: Diagnosis not present

## 2018-01-13 DIAGNOSIS — L97219 Non-pressure chronic ulcer of right calf with unspecified severity: Secondary | ICD-10-CM | POA: Diagnosis not present

## 2018-01-13 DIAGNOSIS — Z7952 Long term (current) use of systemic steroids: Secondary | ICD-10-CM | POA: Insufficient documentation

## 2018-01-13 DIAGNOSIS — H409 Unspecified glaucoma: Secondary | ICD-10-CM | POA: Insufficient documentation

## 2018-01-13 DIAGNOSIS — E1151 Type 2 diabetes mellitus with diabetic peripheral angiopathy without gangrene: Secondary | ICD-10-CM | POA: Insufficient documentation

## 2018-01-13 DIAGNOSIS — J449 Chronic obstructive pulmonary disease, unspecified: Secondary | ICD-10-CM | POA: Insufficient documentation

## 2018-01-13 DIAGNOSIS — M329 Systemic lupus erythematosus, unspecified: Secondary | ICD-10-CM | POA: Insufficient documentation

## 2018-01-13 DIAGNOSIS — Z87891 Personal history of nicotine dependence: Secondary | ICD-10-CM | POA: Diagnosis not present

## 2018-01-13 DIAGNOSIS — I132 Hypertensive heart and chronic kidney disease with heart failure and with stage 5 chronic kidney disease, or end stage renal disease: Secondary | ICD-10-CM | POA: Diagnosis not present

## 2018-01-13 DIAGNOSIS — E1122 Type 2 diabetes mellitus with diabetic chronic kidney disease: Secondary | ICD-10-CM | POA: Insufficient documentation

## 2018-01-13 DIAGNOSIS — N186 End stage renal disease: Secondary | ICD-10-CM | POA: Diagnosis not present

## 2018-01-13 DIAGNOSIS — Z7901 Long term (current) use of anticoagulants: Secondary | ICD-10-CM | POA: Insufficient documentation

## 2018-01-13 DIAGNOSIS — N185 Chronic kidney disease, stage 5: Secondary | ICD-10-CM | POA: Diagnosis not present

## 2018-01-13 DIAGNOSIS — Z992 Dependence on renal dialysis: Secondary | ICD-10-CM | POA: Insufficient documentation

## 2018-01-13 DIAGNOSIS — Z86718 Personal history of other venous thrombosis and embolism: Secondary | ICD-10-CM | POA: Insufficient documentation

## 2018-01-13 HISTORY — PX: BASCILIC VEIN TRANSPOSITION: SHX5742

## 2018-01-13 LAB — POCT I-STAT 4, (NA,K, GLUC, HGB,HCT)
GLUCOSE: 132 mg/dL — AB (ref 65–99)
HCT: 30 % — ABNORMAL LOW (ref 36.0–46.0)
HEMOGLOBIN: 10.2 g/dL — AB (ref 12.0–15.0)
Potassium: 3.8 mmol/L (ref 3.5–5.1)
Sodium: 139 mmol/L (ref 135–145)

## 2018-01-13 LAB — GLUCOSE, CAPILLARY: Glucose-Capillary: 108 mg/dL — ABNORMAL HIGH (ref 65–99)

## 2018-01-13 LAB — PROTIME-INR
INR: 1.1
PROTHROMBIN TIME: 14.1 s (ref 11.4–15.2)

## 2018-01-13 SURGERY — TRANSPOSITION, VEIN, BASILIC
Anesthesia: Monitor Anesthesia Care | Site: Arm Upper | Laterality: Left

## 2018-01-13 MED ORDER — PROMETHAZINE HCL 25 MG/ML IJ SOLN: 6.2500 mg | INTRAMUSCULAR | Status: DC | PRN

## 2018-01-13 MED ORDER — MEPERIDINE HCL 50 MG/ML IJ SOLN: 6.2500 mg | INTRAMUSCULAR | Status: DC | PRN

## 2018-01-13 MED ORDER — SODIUM CHLORIDE 0.9 % IV SOLN
INTRAVENOUS | Status: AC
  Filled 2018-01-13: qty 1.2

## 2018-01-13 MED ORDER — PROPOFOL 10 MG/ML IV BOLUS
INTRAVENOUS | Status: AC
  Filled 2018-01-13: qty 20

## 2018-01-13 MED ORDER — LIDOCAINE HCL (CARDIAC) PF 100 MG/5ML IV SOSY
PREFILLED_SYRINGE | INTRAVENOUS | Status: DC | PRN
  Administered 2018-01-13: 30 mg via INTRAVENOUS

## 2018-01-13 MED ORDER — 0.9 % SODIUM CHLORIDE (POUR BTL) OPTIME
TOPICAL | Status: DC | PRN
  Administered 2018-01-13: 1000 mL

## 2018-01-13 MED ORDER — CEFAZOLIN SODIUM-DEXTROSE 2-4 GM/100ML-% IV SOLN
INTRAVENOUS | Status: AC
Start: 2018-01-13 — End: 2018-01-13
  Filled 2018-01-13: qty 100

## 2018-01-13 MED ORDER — PROTAMINE SULFATE 10 MG/ML IV SOLN
INTRAVENOUS | Status: DC | PRN
  Administered 2018-01-13 (×5): 10 mg via INTRAVENOUS

## 2018-01-13 MED ORDER — PHENYLEPHRINE HCL 10 MG/ML IJ SOLN
INTRAMUSCULAR | Status: DC | PRN
  Administered 2018-01-13: 50 ug/min via INTRAVENOUS

## 2018-01-13 MED ORDER — PROPOFOL 10 MG/ML IV BOLUS
INTRAVENOUS | Status: DC | PRN
  Administered 2018-01-13: 100 mg via INTRAVENOUS

## 2018-01-13 MED ORDER — HEPARIN SODIUM (PORCINE) 1000 UNIT/ML IJ SOLN
INTRAMUSCULAR | Status: AC
  Filled 2018-01-13: qty 2

## 2018-01-13 MED ORDER — FENTANYL CITRATE (PF) 100 MCG/2ML IJ SOLN: 25.0000 ug | INTRAMUSCULAR | Status: DC | PRN

## 2018-01-13 MED ORDER — FENTANYL CITRATE (PF) 100 MCG/2ML IJ SOLN
INTRAMUSCULAR | Status: DC | PRN
  Administered 2018-01-13 (×4): 25 ug via INTRAVENOUS

## 2018-01-13 MED ORDER — LIDOCAINE HCL (PF) 1 % IJ SOLN
INTRAMUSCULAR | Status: AC
  Filled 2018-01-13: qty 30

## 2018-01-13 MED ORDER — HEPARIN SODIUM (PORCINE) 1000 UNIT/ML IJ SOLN
INTRAMUSCULAR | Status: DC | PRN
  Administered 2018-01-13: 5000 [IU] via INTRAVENOUS

## 2018-01-13 MED ORDER — SODIUM CHLORIDE 0.9 % IV SOLN
INTRAVENOUS | Status: DC | PRN
  Administered 2018-01-13: 15:00:00

## 2018-01-13 MED ORDER — OXYCODONE-ACETAMINOPHEN 5-325 MG PO TABS: 1.0000 | ORAL_TABLET | Freq: Four times a day (QID) | ORAL | 0 refills | Status: DC | PRN

## 2018-01-13 MED ORDER — SODIUM CHLORIDE 0.9 % IV SOLN
INTRAVENOUS | Status: DC
  Administered 2018-01-13 (×2): via INTRAVENOUS

## 2018-01-13 MED ORDER — CHLORHEXIDINE GLUCONATE 4 % EX LIQD: 60.0000 mL | Freq: Once | CUTANEOUS | Status: DC

## 2018-01-13 MED ORDER — FENTANYL CITRATE (PF) 250 MCG/5ML IJ SOLN
INTRAMUSCULAR | Status: AC
  Filled 2018-01-13: qty 5

## 2018-01-13 MED ORDER — PROTAMINE SULFATE 10 MG/ML IV SOLN
INTRAVENOUS | Status: AC
  Filled 2018-01-13: qty 5

## 2018-01-13 MED ORDER — CEFAZOLIN SODIUM-DEXTROSE 2-4 GM/100ML-% IV SOLN
2.0000 g | INTRAVENOUS | Status: AC
  Administered 2018-01-13: 2 g via INTRAVENOUS

## 2018-01-13 SURGICAL SUPPLY — 42 items
ADH SKN CLS APL DERMABOND .7 (GAUZE/BANDAGES/DRESSINGS) ×4
AGENT HMST SPONGE THK3/8 (HEMOSTASIS)
ARMBAND PINK RESTRICT EXTREMIT (MISCELLANEOUS) ×2 IMPLANT
CANISTER SUCT 3000ML PPV (MISCELLANEOUS) ×2 IMPLANT
CANNULA VESSEL 3MM 2 BLNT TIP (CANNULA) ×2 IMPLANT
CLIP VESOCCLUDE MED 24/CT (CLIP) ×2 IMPLANT
CLIP VESOCCLUDE SM WIDE 24/CT (CLIP) ×2 IMPLANT
COVER PROBE W GEL 5X96 (DRAPES) ×1 IMPLANT
DECANTER SPIKE VIAL GLASS SM (MISCELLANEOUS) ×2 IMPLANT
DERMABOND ADVANCED (GAUZE/BANDAGES/DRESSINGS) ×4
DERMABOND ADVANCED .7 DNX12 (GAUZE/BANDAGES/DRESSINGS) ×1 IMPLANT
DRAIN PENROSE 1/4X12 LTX STRL (WOUND CARE) IMPLANT
ELECT REM PT RETURN 9FT ADLT (ELECTROSURGICAL) ×2
ELECTRODE REM PT RTRN 9FT ADLT (ELECTROSURGICAL) ×1 IMPLANT
GAUZE SPONGE 4X4 16PLY XRAY LF (GAUZE/BANDAGES/DRESSINGS) ×1 IMPLANT
GLOVE BIO SURGEON STRL SZ 6.5 (GLOVE) ×2 IMPLANT
GLOVE BIO SURGEON STRL SZ7.5 (GLOVE) ×2 IMPLANT
GLOVE BIOGEL PI IND STRL 6.5 (GLOVE) IMPLANT
GLOVE BIOGEL PI INDICATOR 6.5 (GLOVE) ×2
GLOVE SURG SS PI 7.0 STRL IVOR (GLOVE) ×1 IMPLANT
GOWN STRL REUS W/ TWL LRG LVL3 (GOWN DISPOSABLE) ×3 IMPLANT
GOWN STRL REUS W/ TWL XL LVL3 (GOWN DISPOSABLE) IMPLANT
GOWN STRL REUS W/TWL LRG LVL3 (GOWN DISPOSABLE) ×6
GOWN STRL REUS W/TWL XL LVL3 (GOWN DISPOSABLE) ×2
HEMOSTAT SPONGE AVITENE ULTRA (HEMOSTASIS) IMPLANT
KIT BASIN OR (CUSTOM PROCEDURE TRAY) ×2 IMPLANT
KIT TURNOVER KIT B (KITS) ×2 IMPLANT
LOOP VESSEL MINI RED (MISCELLANEOUS) IMPLANT
NS IRRIG 1000ML POUR BTL (IV SOLUTION) ×2 IMPLANT
PACK CV ACCESS (CUSTOM PROCEDURE TRAY) ×2 IMPLANT
PAD ARMBOARD 7.5X6 YLW CONV (MISCELLANEOUS) ×4 IMPLANT
SUT PROLENE 7 0 BV 1 (SUTURE) ×3 IMPLANT
SUT SILK 2 0 SH (SUTURE) IMPLANT
SUT SILK 3 0 (SUTURE) ×2
SUT SILK 3-0 18XBRD TIE 12 (SUTURE) IMPLANT
SUT VIC AB 3-0 SH 27 (SUTURE) ×8
SUT VIC AB 3-0 SH 27X BRD (SUTURE) ×1 IMPLANT
SUT VIC AB 4-0 PS2 27 (SUTURE) ×1 IMPLANT
SUT VICRYL 4-0 PS2 18IN ABS (SUTURE) ×2 IMPLANT
TOWEL GREEN STERILE (TOWEL DISPOSABLE) ×2 IMPLANT
UNDERPAD 30X30 (UNDERPADS AND DIAPERS) ×2 IMPLANT
WATER STERILE IRR 1000ML POUR (IV SOLUTION) ×2 IMPLANT

## 2018-01-13 NOTE — Transfer of Care (Signed)
Immediate Anesthesia Transfer of Care Note  Patient: [name]  Procedure(s) Performed: LEFT BASILIC VEIN TRANSPOSITION SECOND STAGE (Left Arm Upper)  Patient Location: PACU  Anesthesia Type:General  Level of Consciousness: awake, alert  and oriented  Airway & Oxygen Therapy: Patient Spontanous Breathing and Patient connected to nasal cannula oxygen  Post-op Assessment: Report given to RN and Post -op Vital signs reviewed and stable  Post vital signs: Reviewed and stable  Last Vitals:  Vitals Value Taken Time  BP 153/65 01/13/2018  4:43 PM  Temp 36.7 C 01/13/2018  4:43 PM  Pulse 56 01/13/2018  4:44 PM  Resp 21 01/13/2018  4:44 PM  SpO2 92 % 01/13/2018  4:44 PM  Vitals shown include unvalidated device data.  Last Pain:  Vitals:   01/13/18 1211  TempSrc:   PainSc: 0-No pain         Complications: No apparent anesthesia complications

## 2018-01-13 NOTE — Progress Notes (Deleted)
[facility] transportation notified of patient discharge. Will arrive to hospital at 5:40 to transport patient home.

## 2018-01-13 NOTE — Anesthesia Procedure Notes (Signed)
Procedure Name: LMA Insertion Date/Time: 01/12/2018 2:21 PM Performed by: Eligha Bridegroom, CRNA Pre-anesthesia Checklist: Patient identified, Emergency Drugs available, Suction available, Timeout performed and Patient being monitored Patient Re-evaluated:Patient Re-evaluated prior to induction Oxygen Delivery Method: Circle system utilized Preoxygenation: Pre-oxygenation with 100% oxygen Induction Type: IV induction Ventilation: Mask ventilation without difficulty LMA: LMA inserted LMA Size: 4.0 Placement Confirmation: breath sounds checked- equal and bilateral Tube secured with: Tape Dental Injury: Teeth and Oropharynx as per pre-operative assessment

## 2018-01-13 NOTE — Op Note (Signed)
Procedure: 2nd stage basilic vein transposition fistula Preop: ESRD Postop: ESRD Anesthesia: General Asst: Luanne Bras RNFA Findings 6 mm fistula  Operative details: After obtaining informed consent, the patient was taken to the operating room. The patient was placed in supine position on the operating room table. After administration of general anesthesia, the patient's entire left upper extremity was prepped and draped in usual sterile fashion. A longitudinal incision was made in the left antecubital area. Incision was carried down through the subcutaneous tissues down to the level of the preexisting basilic to brachial artery fistula. The vein was approximately 6 mm in diameter. The fistula was dissected free circumferentially and side branches ligated and divided between silk ties.  Two additional longitudinal incisions were made to dissect out the vein to the level of the axilla.  The remainder of the branches were smaller and ligated and divided between clips or ties. The vein was intertwined in a sensory nerve so the patient was given 5000 units of heparin and after 2 minutes the fistula clamped proximally and distally near the antecubital region.  The fistula was divided and then transposed superficial to the nerve.  It was replaced by reconstructing the anastomosis end to end with a running 7 0 prolene suture. After the fistula was completely mobilized several 3 0 Vicryl sutures were placed posterior to the vein to encourage it to be closer to the skin surface.  Also the subcutaneous fat under the skin bridges was debrided so the vein would be closer to the skin surface.    The skin incisions were closed with 4-0 Vicryl subcuticular stitch. Dermabond was applied to all incisions.  The patient tolerated the procedure well and there were no complications. Instrument sponge and needle counts were correct at the end of the case. The patient was taken to the recovery room in stable condition. The  patient had a palpable radial pulse at the end of the case.  [name], MD

## 2018-01-13 NOTE — Progress Notes (Signed)
Report called to [employer] [facility] [employer]. Patient transporting back to rehab facility with husband.

## 2018-01-13 NOTE — Interval H&P Note (Signed)
History and Physical Interval Note:  01/13/2018 1:17 PM  [name]  has presented today for surgery, with the diagnosis of end stage renal disease  The various methods of treatment have been discussed with the patient and family. After consideration of risks, benefits and other options for treatment, the patient has consented to  Procedure(s): BASILIC VEIN TRANSPOSITION SECOND STAGE (Left) as a surgical intervention .  The patient's history has been reviewed, patient examined, no change in status, stable for surgery.  I have reviewed the patient's chart and labs.  Questions were answered to the patient's satisfaction.     [name]

## 2018-01-13 NOTE — Anesthesia Preprocedure Evaluation (Addendum)
Anesthesia Evaluation  Patient identified by MRN, date of birth, ID band Patient awake    Reviewed: Allergy & Precautions, H&P , NPO status , Patient's Chart, lab work & pertinent test results, reviewed documented beta blocker date and time   Airway Mallampati: II  TM Distance: >3 FB Neck ROM: full    Dental   Pulmonary shortness of breath and with exertion, asthma , pneumonia, COPD,  COPD inhaler, former smoker,    breath sounds clear to auscultation       Cardiovascular hypertension, On Medications, On Home Beta Blockers and Pt. on home beta blockers + Peripheral Vascular Disease and +CHF  + Valvular Problems/Murmurs AI  Rhythm:regular     Neuro/Psych negative neurological ROS  negative psych ROS   GI/Hepatic negative GI ROS, Neg liver ROS,   Endo/Other  diabetes, Insulin Dependent  Renal/GU ESRFRenal disease  negative genitourinary   Musculoskeletal  (+) Arthritis ,   Abdominal   Peds  Hematology  (+) anemia ,   Anesthesia Other Findings See surgeon's H&P   Reproductive/Obstetrics negative OB ROS                             Anesthesia Physical  Anesthesia Plan  ASA: III  Anesthesia Plan: General   Post-op Pain Management:    Induction: Intravenous  PONV Risk Score and Plan: 2 and Ondansetron and Dexamethasone  Airway Management Planned: LMA  Additional Equipment:   Intra-op Plan:   Post-operative Plan: Extubation in OR  Informed Consent: I have reviewed the patients History and Physical, chart, labs and discussed the procedure including the risks, benefits and alternatives for the proposed anesthesia with the patient or authorized representative who has indicated his/her understanding and acceptance.   Dental Advisory Given  Plan Discussed with: CRNA and Surgeon  Anesthesia Plan Comments:        Anesthesia Quick Evaluation

## 2018-01-14 ENCOUNTER — Encounter (HOSPITAL_COMMUNITY): Payer: Self-pay | Admitting: Vascular Surgery

## 2018-01-14 DIAGNOSIS — D631 Anemia in chronic kidney disease: Secondary | ICD-10-CM | POA: Diagnosis not present

## 2018-01-14 DIAGNOSIS — D689 Coagulation defect, unspecified: Secondary | ICD-10-CM | POA: Diagnosis not present

## 2018-01-14 DIAGNOSIS — E039 Hypothyroidism, unspecified: Secondary | ICD-10-CM | POA: Diagnosis not present

## 2018-01-14 DIAGNOSIS — E1129 Type 2 diabetes mellitus with other diabetic kidney complication: Secondary | ICD-10-CM | POA: Diagnosis not present

## 2018-01-14 DIAGNOSIS — E876 Hypokalemia: Secondary | ICD-10-CM | POA: Diagnosis not present

## 2018-01-14 DIAGNOSIS — N2581 Secondary hyperparathyroidism of renal origin: Secondary | ICD-10-CM | POA: Diagnosis not present

## 2018-01-14 DIAGNOSIS — N186 End stage renal disease: Secondary | ICD-10-CM | POA: Diagnosis not present

## 2018-01-14 DIAGNOSIS — Z23 Encounter for immunization: Secondary | ICD-10-CM | POA: Diagnosis not present

## 2018-01-15 NOTE — Addendum Note (Signed)
Addendum  created 01/15/18 0239 by [name], MD   Sign clinical note

## 2018-01-15 NOTE — Anesthesia Postprocedure Evaluation (Signed)
Anesthesia Post Note  Patient: [name]  Procedure(s) Performed: LEFT BASILIC VEIN TRANSPOSITION SECOND STAGE (Left Arm Upper)     Patient location during evaluation: PACU Anesthesia Type: General Level of consciousness: sedated and patient cooperative Pain management: pain level controlled Vital Signs Assessment: post-procedure vital signs reviewed and stable Respiratory status: spontaneous breathing Cardiovascular status: stable Anesthetic complications: no    Last Vitals:  Vitals:   01/13/18 1705 01/13/18 1735  BP: (!) 147/53 (!) 153/54  Pulse: (!) 113 75  Resp: 20 16  Temp: 36.7 C 36.7 C  SpO2: 97% 94%    Last Pain:  Vitals:   01/13/18 1735  TempSrc:   PainSc: 0-No pain   Pain Goal:                 [name]

## 2018-01-16 ENCOUNTER — Encounter (HOSPITAL_COMMUNITY): Payer: Self-pay | Admitting: Emergency Medicine

## 2018-01-16 ENCOUNTER — Emergency Department (HOSPITAL_COMMUNITY)
Admission: EM | Admit: 2018-01-16 | Discharge: 2018-01-16 | Disposition: A | Discharge status: Home / Self Care | Payer: Medicare Other | Attending: Emergency Medicine | Admitting: Emergency Medicine

## 2018-01-16 DIAGNOSIS — I5032 Chronic diastolic (congestive) heart failure: Secondary | ICD-10-CM | POA: Insufficient documentation

## 2018-01-16 DIAGNOSIS — I110 Hypertensive heart disease with heart failure: Secondary | ICD-10-CM | POA: Insufficient documentation

## 2018-01-16 DIAGNOSIS — E0822 Diabetes mellitus due to underlying condition with diabetic chronic kidney disease: Secondary | ICD-10-CM | POA: Insufficient documentation

## 2018-01-16 DIAGNOSIS — E039 Hypothyroidism, unspecified: Secondary | ICD-10-CM | POA: Diagnosis not present

## 2018-01-16 DIAGNOSIS — G8918 Other acute postprocedural pain: Secondary | ICD-10-CM | POA: Diagnosis not present

## 2018-01-16 DIAGNOSIS — Z794 Long term (current) use of insulin: Secondary | ICD-10-CM | POA: Diagnosis not present

## 2018-01-16 DIAGNOSIS — Z23 Encounter for immunization: Secondary | ICD-10-CM | POA: Diagnosis not present

## 2018-01-16 DIAGNOSIS — J45909 Unspecified asthma, uncomplicated: Secondary | ICD-10-CM | POA: Insufficient documentation

## 2018-01-16 DIAGNOSIS — N2581 Secondary hyperparathyroidism of renal origin: Secondary | ICD-10-CM | POA: Diagnosis not present

## 2018-01-16 DIAGNOSIS — N184 Chronic kidney disease, stage 4 (severe): Secondary | ICD-10-CM | POA: Diagnosis not present

## 2018-01-16 DIAGNOSIS — E876 Hypokalemia: Secondary | ICD-10-CM | POA: Diagnosis not present

## 2018-01-16 DIAGNOSIS — Z87891 Personal history of nicotine dependence: Secondary | ICD-10-CM | POA: Diagnosis not present

## 2018-01-16 DIAGNOSIS — M25522 Pain in left elbow: Secondary | ICD-10-CM | POA: Diagnosis not present

## 2018-01-16 DIAGNOSIS — D631 Anemia in chronic kidney disease: Secondary | ICD-10-CM | POA: Diagnosis not present

## 2018-01-16 DIAGNOSIS — E1129 Type 2 diabetes mellitus with other diabetic kidney complication: Secondary | ICD-10-CM | POA: Diagnosis not present

## 2018-01-16 DIAGNOSIS — N186 End stage renal disease: Secondary | ICD-10-CM | POA: Diagnosis not present

## 2018-01-16 DIAGNOSIS — M79632 Pain in left forearm: Secondary | ICD-10-CM | POA: Diagnosis not present

## 2018-01-16 DIAGNOSIS — Z79899 Other long term (current) drug therapy: Secondary | ICD-10-CM | POA: Insufficient documentation

## 2018-01-16 DIAGNOSIS — D689 Coagulation defect, unspecified: Secondary | ICD-10-CM | POA: Diagnosis not present

## 2018-01-16 LAB — CBC WITH DIFFERENTIAL/PLATELET
BASOS ABS: 0 10*3/uL (ref 0.0–0.1)
Basophils Relative: 0 %
EOS ABS: 0 10*3/uL (ref 0.0–0.7)
EOS PCT: 0 %
HCT: 25.2 % — ABNORMAL LOW (ref 36.0–46.0)
HEMOGLOBIN: 7.5 g/dL — AB (ref 12.0–15.0)
LYMPHS ABS: 1 10*3/uL (ref 0.7–4.0)
LYMPHS PCT: 15 %
MCH: 27.4 pg (ref 26.0–34.0)
MCHC: 29.8 g/dL — ABNORMAL LOW (ref 30.0–36.0)
MCV: 92 fL (ref 78.0–100.0)
Monocytes Absolute: 0.5 10*3/uL (ref 0.1–1.0)
Monocytes Relative: 7 %
NEUTROS PCT: 78 %
Neutro Abs: 5.5 10*3/uL (ref 1.7–7.7)
PLATELETS: 209 10*3/uL (ref 150–400)
RBC: 2.74 MIL/uL — AB (ref 3.87–5.11)
RDW: 15.3 % (ref 11.5–15.5)
WBC: 7 10*3/uL (ref 4.0–10.5)

## 2018-01-16 LAB — COMPREHENSIVE METABOLIC PANEL
ALT: 6 U/L — ABNORMAL LOW (ref 14–54)
AST: 17 U/L (ref 15–41)
Albumin: 2.3 g/dL — ABNORMAL LOW (ref 3.5–5.0)
Alkaline Phosphatase: 70 U/L (ref 38–126)
Anion gap: 12 (ref 5–15)
BILIRUBIN TOTAL: 0.4 mg/dL (ref 0.3–1.2)
BUN: 7 mg/dL (ref 6–20)
CHLORIDE: 99 mmol/L — AB (ref 101–111)
CO2: 26 mmol/L (ref 22–32)
CREATININE: 1.48 mg/dL — AB (ref 0.44–1.00)
Calcium: 8.4 mg/dL — ABNORMAL LOW (ref 8.9–10.3)
GFR, EST AFRICAN AMERICAN: 40 mL/min — AB (ref >60)
GFR, EST NON AFRICAN AMERICAN: 34 mL/min — AB (ref >60)
Glucose, Bld: 194 mg/dL — ABNORMAL HIGH (ref 65–99)
POTASSIUM: 3.8 mmol/L (ref 3.5–5.1)
Sodium: 137 mmol/L (ref 135–145)
TOTAL PROTEIN: 6.6 g/dL (ref 6.5–8.1)

## 2018-01-16 LAB — I-STAT CG4 LACTIC ACID, ED: LACTIC ACID, VENOUS: 1.8 mmol/L (ref 0.5–1.9)

## 2018-01-16 MED ORDER — OXYCODONE HCL 5 MG PO TABS
5.0000 mg | ORAL_TABLET | Freq: Once | ORAL | Status: AC
  Administered 2018-01-16: 5 mg via ORAL
  Filled 2018-01-16: qty 1

## 2018-01-16 MED ORDER — FENTANYL CITRATE (PF) 100 MCG/2ML IJ SOLN: 50.0000 ug | Freq: Once | INTRAMUSCULAR | Status: DC

## 2018-01-16 NOTE — ED Triage Notes (Signed)
Pt presents 4 days post-op from fistula placement in the left arm.  Entire arm is swollen and red, c/o increasing pain and general malaise at home.

## 2018-01-16 NOTE — Consult Note (Signed)
Patient name: [name] MRN: [id number] DOB: 1946-05-21 Sex: female  REASON FOR CONSULT:   Swelling in the left arm after placement of an AV fistula.  Consult is requested by Dr. Sherry Ruffing  HPI:   [name] is a pleasant 72 y.o. female who underwent a second stage basilic vein transposition on 01/13/2018.  She has a functioning dialysis catheter.  She is on Xarelto.  She was sent from the dialysis unit because of swelling in the left arm and the concern for infection.  She has moderate discomfort of the arm.  Current Facility-Administered Medications  Medication Dose Route Frequency Provider Last Rate Last Dose  . fentaNYL (SUBLIMAZE) injection 50 mcg  50 mcg Intravenous Once Tegeler, Gwenyth Allegra, MD       Current Outpatient Medications  Medication Sig Dispense Refill  . albuterol (ACCUNEB) 0.63 MG/3ML nebulizer solution Take 1 ampule by nebulization every 6 (six) hours as needed for wheezing or shortness of breath.     Marland Kitchen albuterol (PROVENTIL HFA;VENTOLIN HFA) 108 (90 BASE) MCG/ACT inhaler Inhale 2 puffs into the lungs every 6 (six) hours as needed. For shortness of breath    . Amino Acids-Protein Hydrolys (FEEDING SUPPLEMENT, PRO-STAT SUGAR FREE 64,) LIQD Take 30 mLs by mouth 2 (two) times daily. 900 mL 0  . apixaban (ELIQUIS) 5 MG TABS tablet Take 1 tablet (5 mg total) by mouth 2 (two) times daily. 60 tablet 6  . atorvastatin (LIPITOR) 20 MG tablet Take 20 mg by mouth daily.    . brimonidine-timolol (COMBIGAN) 0.2-0.5 % ophthalmic solution Place 1 drop into the left eye daily.     . diclofenac (VOLTAREN) 0.1 % ophthalmic solution Place 1 drop into the left eye daily. Uses at lunch time    . dicyclomine (BENTYL) 10 MG capsule Take 10 mg by mouth 4 (four) times daily -  before meals and at bedtime.    . febuxostat (ULORIC) 40 MG tablet Take 40 mg by mouth daily.    . folic acid (FOLVITE) 1 MG tablet Take 1 mg by mouth daily.    . hydroxychloroquine (PLAQUENIL) 200 MG  tablet Take 300 mg by mouth as directed. Take 1 1/2 tablets daily    . insulin aspart (NOVOLOG) 100 UNIT/ML injection Inject 0-9 Units into the skin 3 (three) times daily with meals. 10 mL 11  . metoprolol tartrate (LOPRESSOR) 50 MG tablet Take 50 mg by mouth 2 (two) times daily. Take 1 tablet twice a day, except on Tuesday, Thursday, Saturday, take 1 tablet daily in the morning.    . Multiple Vitamins-Minerals (CENTRUM SILVER PO) Take 1 tablet by mouth daily.    Marland Kitchen oxyCODONE-acetaminophen (PERCOCET/ROXICET) 5-325 MG tablet Take 1 tablet by mouth every 6 (six) hours as needed for severe pain. 20 tablet 0  . predniSONE (DELTASONE) 5 MG tablet Take 5 mg by mouth daily with breakfast.    . travoprost, benzalkonium, (TRAVATAN) 0.004 % ophthalmic solution Place 1 drop into the left eye at bedtime.    Marland Kitchen acetaminophen (TYLENOL) 500 MG tablet Take 1,000 mg by mouth every 6 (six) hours as needed. For pain    . Nutritional Supplements (FEEDING SUPPLEMENT, NEPRO CARB STEADY,) LIQD Take 237 mLs by mouth daily. (Patient not taking: Reported on 01/16/2018) 1 Can 0    REVIEW OF SYSTEMS:  [X]  denotes positive finding, [ ]  denotes negative finding Cardiac  Comments:  Chest pain or chest pressure:    Shortness of breath upon exertion:  Short of breath when lying flat:    Irregular heart rhythm:    Constitutional    Fever or chills:     PHYSICAL EXAM:   Vitals:   01/16/18 1741 01/16/18 1924 01/16/18 1926 01/16/18 1930  BP: (!) 141/40 (!) 154/43  (!) 143/57  Pulse: 80  87   Temp: 97.9 F (36.6 C)     TempSrc: Oral     SpO2:   93%     GENERAL: The patient is a well-nourished female, in no acute distress. The vital signs are documented above. CARDIOVASCULAR: There is a regular rate and rhythm. PULMONARY: There is good air exchange bilaterally without wheezing or rales. She has significant swelling of the entire left upper extremity with ecchymosis. She has an audible thrill in her fistula.  She has a  brisk radial and ulnar signal with the Doppler. Her incisions look fine.  DATA:   No new data.  MEDICAL ISSUES:   STATUS POST SECOND STAGE BASILIC VEIN TRANSPOSITION: Clearly she had bleeding related to her anticoagulation after her second stage basilic vein transposition.  This is an extensive dissection.  I do not see a focal area that could be drained and there is no evidence of active bleeding.  Likewise I do not see any signs of infection.  I have simply instructed her to elevate her arm on 2-3 pillows above her heart is much as possible.  Swelling could also potentially be related to a central venous obstruction although would be too early at this point to cannulate the fistula.  I will arrange for follow-up in the office with Dr. Oneida Alar in 2-3 weeks.  She will call sooner if there are problems.   [name] [facility] [phone]

## 2018-01-16 NOTE — ED Notes (Signed)
Pt stable, ambulatory, states understanding of discharge instructions 

## 2018-01-16 NOTE — ED Provider Notes (Signed)
[facility] EMERGENCY DEPARTMENT Provider Note   CSN: [id number] Arrival date & time: 01/16/18  1659     History   Chief Complaint Chief Complaint  Patient presents with  . Post-op Problem    HPI [name] is a 72 y.o. female.  The history is provided by the patient, the spouse and medical records.  Wound Check  This is a new problem. The current episode started more than 2 days ago. The problem occurs constantly. The problem has been gradually worsening. Pertinent negatives include no chest pain, no abdominal pain, no headaches and no shortness of breath. Nothing aggravates the symptoms. Nothing relieves the symptoms. She has tried nothing for the symptoms. The treatment provided no relief.    Past Medical History:  Diagnosis Date  . Anemia   . Arthritis   . Asthma   . Atrial fibrillation ([place])   . Atrial flutter [facility])    TEE/DCCV December 2013 - on Xarelto [facility])  . Bone spur of toe    Right 5th toe  . Chronic diastolic CHF (congestive heart failure) (HCC)    a. EF 50-55% by echo in 2015  . CKD (chronic kidney disease) stage 4, GFR 15-29 ml/min (HCC)    a. s/p fistula placement but not on HD. Followed by Nephrology.   . Colon polyposis   . COPD (chronic obstructive pulmonary disease) ([place])   . DVT of upper extremity (deep vein thrombosis) ([place])    Right basilic vein, June 7893  . Essential hypertension, benign   . Fractures   . Glaucoma   . SLE (systemic lupus erythematosus) ([place])   . Type 2 diabetes mellitus ([place])   . UTI (lower urinary tract infection)     Patient Active Problem List   Diagnosis Date Noted  . Chronic RLQ pain 01/11/2018  . Ischemic ulcer of toes on both feet ([place])   . Cellulitis in diabetic foot ([place]) 12/14/2017  . Atrial fibrillation ([place]) 12/14/2017  . Glaucoma 12/14/2017  . ESRD (end stage renal disease) on dialysis ([place]) 11/16/2017  . COPD with acute exacerbation ([place]) 11/03/2017  . Community acquired  pneumonia 11/02/2017  . Nausea and vomiting 11/02/2017  . Iron deficiency anemia 08/07/2017  . Heme positive stool 10/04/2015  . Mixed hyperlipidemia 06/29/2015  . Type 2 diabetes mellitus with stage 4 chronic kidney disease ([place]) 06/29/2015  . Chronic kidney disease, stage V ([place]) 04/12/2014  . 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 Encounter for therapeutic drug monitoring 03/30/2014  . Acute respiratory failure with hypoxia ([place]) 03/15/2014  . Chronic diastolic heart failure ([place]) 03/15/2014  . Anemia of chronic renal failure, stage 4 (severe) ([place]) 02/23/2014  . Folate deficiency 02/17/2014  . Aortic regurgitation 09/19/2012  . Atrial flutter ([place]) 09/14/2012  . Class 2 severe obesity due to excess calories with serious comorbidity and body mass index (BMI) of 35.0 to 35.9 in adult ([place]) 09/14/2012  . Type 2 diabetes mellitus with diabetic neuropathy ([place]) 09/14/2012  . SLE (systemic lupus erythematosus) ([place]) 09/14/2012  . Essential hypertension, benign 09/14/2012  . COPD (chronic obstructive pulmonary disease) ([place]) 09/14/2012    Past Surgical History:  Procedure Laterality Date  . ABDOMINAL AORTOGRAM W/LOWER EXTREMITY N/A 12/18/2017   Procedure: ABDOMINAL AORTOGRAM W/LOWER EXTREMITY;  Surgeon: Elam Dutch, MD;  Location: [facility];  Service: Cardiovascular;  Laterality: N/A;  bilateral  . ABDOMINAL HYSTERECTOMY  1983  . ANKLE SURGERY  1993  . AV  FISTULA PLACEMENT Left 03/27/2014   Procedure: ARTERIOVENOUS (AV) FISTULA CREATION;  Surgeon: Rosetta Posner, MD;  Location: [facility];  Service: Vascular;  Laterality: Left;  . BACK SURGERY  1980  . BASCILIC VEIN TRANSPOSITION Left 01/13/2018   Procedure: LEFT BASILIC VEIN TRANSPOSITION SECOND STAGE;  Surgeon: Elam Dutch, MD;  Location: [facility];  Service: Vascular;  Laterality: Left;  . CARDIOVASCULAR STRESS TEST  12/19/2009   no stress induced rhythm abnormalities,  ekg negative for ischemia  . CARDIOVERSION  09/16/2012   Procedure: CARDIOVERSION;  Surgeon: Sanda Klein, MD;  Location: [facility];  Service: Cardiovascular;  Laterality: N/A;  . COLONOSCOPY  09/20/2012   Dr. [name]: multiple tubular adenomas   . COLONOSCOPY  2005   Dr. [name]. Polyps, path unknown   . COLONOSCOPY N/A 07/24/2016   Procedure: COLONOSCOPY;  Surgeon: [name], MD;  [facility];  Service: Endoscopy;  Laterality: N/A;  9:00 am  . ESOPHAGOGASTRODUODENOSCOPY  09/20/2012   Dr. [name]: duodenal erosion and possible resolving ulcer at the angularis   . IR FLUORO GUIDE CV LINE RIGHT  11/09/2017  . IR US GUIDE VASC ACCESS RIGHT  11/09/2017  . TEE WITHOUT CARDIOVERSION  09/16/2012   Procedure: TRANSESOPHAGEAL ECHOCARDIOGRAM (TEE);  Surgeon: Sanda Klein, MD;  Location: [facility] ENDOSCOPY;  Service: Cardiovascular;  Laterality: N/A;  . TRANSESOPHAGEAL ECHOCARDIOGRAM WITH CARDIOVERSION  09/16/2012   EF 60-65%, moderate LVH, moderate regurg of the aortic valve, LA moderately dilated     OB History   None      Home Medications    Prior to Admission medications   Medication Sig Start Date End Date Taking? Authorizing Provider  Amino Acids-Protein Hydrolys (FEEDING SUPPLEMENT, PRO-STAT SUGAR FREE 64,) LIQD Take 30 mLs by mouth 2 (two) times daily. 12/29/17  Yes Danford, Suann Larry, MD  apixaban (ELIQUIS) 5 MG TABS tablet Take 1 tablet (5 mg total) by mouth 2 (two) times daily. 12/29/17  Yes Danford, Suann Larry, MD  dicyclomine (BENTYL) 10 MG capsule Take 10 mg by mouth 4 (four) times daily -  before meals and at bedtime.   Yes [provider]  febuxostat (ULORIC) 40 MG tablet Take 40 mg by mouth daily.   Yes [provider]  folic acid (FOLVITE) 1 MG tablet Take 1 mg by mouth daily.   Yes [provider]  hydroxychloroquine (PLAQUENIL) 200 MG tablet Take 300 mg by mouth as directed. Take 1 1/2 tablets daily   Yes [provider]   insulin aspart (NOVOLOG) 100 UNIT/ML injection Inject 0-9 Units into the skin 3 (three) times daily with meals. 12/29/17  Yes Danford, Suann Larry, MD  metoprolol tartrate (LOPRESSOR) 50 MG tablet Take 50 mg by mouth 2 (two) times daily. Take 1 tablet twice a day, except on Tuesday, Thursday, Saturday, take 1 tablet daily in the morning.   Yes [provider]  Multiple Vitamins-Minerals (CENTRUM SILVER PO) Take 1 tablet by mouth daily.   Yes [provider]  predniSONE (DELTASONE) 5 MG tablet Take 5 mg by mouth daily with breakfast.   Yes [provider]  acetaminophen (TYLENOL) 500 MG tablet Take 1,000 mg by mouth every 6 (six) hours as needed. For pain    [provider]  albuterol (ACCUNEB) 0.63 MG/3ML nebulizer solution Take 1 ampule by nebulization every 6 (six) hours as needed for wheezing or shortness of breath.     [provider]  albuterol (PROVENTIL HFA;VENTOLIN HFA) 108 (90 BASE) MCG/ACT inhaler  Inhale 2 puffs into the lungs every 6 (six) hours as needed. For shortness of breath    [provider]  atorvastatin (LIPITOR) 20 MG tablet Take 20 mg by mouth daily.    [provider]  brimonidine-timolol (COMBIGAN) 0.2-0.5 % ophthalmic solution Place 1 drop into the left eye daily.     [provider]  diclofenac (VOLTAREN) 0.1 % ophthalmic solution Place 1 drop into the left eye daily. Uses at lunch time    [provider]  Nutritional Supplements (FEEDING SUPPLEMENT, NEPRO CARB STEADY,) LIQD Take 237 mLs by mouth daily. 12/29/17   Danford, Suann Larry, MD  oxyCODONE-acetaminophen (PERCOCET/ROXICET) 5-325 MG tablet Take 1 tablet by mouth every 6 (six) hours as needed for severe pain. 01/13/18   Elam Dutch, MD  travoprost, benzalkonium, (TRAVATAN) 0.004 % ophthalmic solution Place 1 drop into the left eye at bedtime.    [provider]    Family History Family History  Problem Relation Age of  Onset  . Diabetes Brother   . Diabetes Father   . Hypertension Father   . Hypertension Sister   . Stroke Mother   . Diabetes Sister   . Hypertension Brother   . Colon cancer Neg Hx     Social History Social History   Tobacco Use  . Smoking status: Former Smoker    Packs/day: 1.00    Years: 30.00    Pack years: 30.00    Types: Cigarettes    Last attempt to quit: 08/29/2012    Years since quitting: 5.3  . Smokeless tobacco: Never Used  . Tobacco comment: quit 08-2012  Substance Use Topics  . Alcohol use: No    Alcohol/week: 0.0 oz  . Drug use: No     Allergies   Ace inhibitors   Review of Systems Review of Systems  Constitutional: Negative for chills, diaphoresis, fatigue and fever.  HENT: Negative for congestion.   Respiratory: Negative for cough, chest tightness, shortness of breath and wheezing.   Cardiovascular: Negative for chest pain.  Gastrointestinal: Negative for abdominal pain, diarrhea, nausea and vomiting.  Genitourinary: Negative for flank pain.  Musculoskeletal: Negative for back pain, neck pain and neck stiffness.  Skin: Positive for color change. Negative for rash.  Neurological: Negative for headaches.  Psychiatric/Behavioral: Negative for agitation.  All other systems reviewed and are negative.    Physical Exam Updated Vital Signs BP (!) 143/57   Pulse 87   Temp 97.9 F (36.6 C) (Oral)   SpO2 93%   Physical Exam  Constitutional: She is oriented to person, place, and time. She appears well-developed and well-nourished. No distress.  HENT:  Head: Normocephalic and atraumatic.  Mouth/Throat: Oropharynx is clear and moist.  Eyes: Pupils are equal, round, and reactive to light. Conjunctivae and EOM are normal.  Neck: Normal range of motion. Neck supple.  Cardiovascular: Normal rate and intact distal pulses.  Pulmonary/Chest: Effort normal. No respiratory distress. She has no rales. She exhibits no tenderness.  Abdominal: Soft. She exhibits  no distension. There is no tenderness.  Musculoskeletal: Normal range of motion. She exhibits edema and tenderness.       Left elbow: She exhibits swelling. Tenderness found.       Left forearm: She exhibits tenderness, swelling and edema.       Arms: Neurological: She is alert and oriented to person, place, and time. No sensory deficit. She exhibits normal muscle tone.  Skin: Skin is warm. Capillary refill takes less than 2  seconds. No rash noted. She is not diaphoretic. There is erythema.  Psychiatric: She has a normal mood and affect.  Nursing note and vitals reviewed.        ED Treatments / Results  Labs (all labs ordered are listed, but only abnormal results are displayed) Labs Reviewed  COMPREHENSIVE METABOLIC PANEL - Abnormal; Notable for the following components:      Result Value   Chloride 99 (*)    Glucose, Bld 194 (*)    Creatinine, Ser 1.48 (*)    Calcium 8.4 (*)    Albumin 2.3 (*)    ALT 6 (*)    GFR calc non Af Amer 34 (*)    GFR calc Af Amer 40 (*)    All other components within normal limits  CBC WITH DIFFERENTIAL/PLATELET - Abnormal; Notable for the following components:   RBC 2.74 (*)    Hemoglobin 7.5 (*)    HCT 25.2 (*)    MCHC 29.8 (*)    All other components within normal limits  CULTURE, BLOOD (ROUTINE X 2)  CULTURE, BLOOD (ROUTINE X 2)  I-STAT CG4 LACTIC ACID, ED  I-STAT CG4 LACTIC ACID, ED    EKG None  Radiology No results found.  Procedures Procedures (including critical care time)  Medications Ordered in ED Medications  fentaNYL (SUBLIMAZE) injection 50 mcg (0 mcg Intravenous Hold 01/16/18 2054)  oxyCODONE (Oxy IR/ROXICODONE) immediate release tablet 5 mg (5 mg Oral Given 01/16/18 2109)     Initial Impression / Assessment and Plan / ED Course  I have reviewed the triage vital signs and the nursing notes.  Pertinent labs & imaging results that were available during my care of the patient were reviewed by me and considered in my  medical decision making (see chart for details).     [name] is a 72 y.o. female with a past medical history significant for ESRD on intermittent dialysis, CHF, hypertension, atrial fibrillation on Eliquis, prior ED asthma as well as vascular surgery with a left fistula placement 3 days ago who presents with left arm pain, swelling, and redness.  Patient reports that she had a vascular surgery procedure performed 3 days ago by Dr. Oneida Alar.  She reports that since discharge, she has had worsening pain, color change, redness, and she was concerned about possible drainage.  She denies any numbness, tingling, or weakness.  She reports that the pain is 10 out of 10.  She denies fevers or chills.  She denies chest pain, shortness of breath or cough.  She denies other complaints.  On exam, see clinical photo.  Patient was found to have ecchymosis and redness of the left arm.  Patient had a dopplerable radial and ulnar pulse on the left arm.  Patient had normal grip strength and normal sensation in the arm.  Tenderness present all throughout the arm however no crepitance or discharge was appreciated.  Clinically I am concerned this could be a postoperative infection versus postoperative pain in the setting of her large ecchymosis on her blood thinners.  Vascular surgery was called to come to the patient.  Screening laboratory test performed and pain medicine given. Laboratory testing revealed no leukocytosis.  Hemoglobin is Anemic however it is similar to recent findings.  Lactic acid not elevated.  Vascular surgery saw the patient and did not feel she had any postoperative infection.  They recommended holding antibiotics.  They suspect this is pain swelling and bruising from the procedure.  They recommended elevation and  seeing her primary vascular surgeon in several days.  They have agreed with return precautions.  Patient understood plan of care and was discharged in good condition after  evaluation and recommendations of discharge by vascular surgery.    Final Clinical Impressions(s) / ED Diagnoses   Final diagnoses:  Post-operative pain    ED Discharge Orders    None      Clinical Impression: 1. Post-operative pain     Disposition: Discharge  Condition: Good  I have discussed the results, Dx and Tx plan with the pt(& family if present). He/she/they expressed understanding and agree(s) with the plan. Discharge instructions discussed at great length. Strict return precautions discussed and pt &/or family have verbalized understanding of the instructions. No further questions at time of discharge.    Discharge Medication List as of 01/16/2018  9:07 PM      Follow Up: [facility] [address] [id number] mc [place], [place], [place] [address] [phone]        Rowan Pollman, Gwenyth Allegra, MD 01/17/18 [phone]

## 2018-01-16 NOTE — Discharge Instructions (Addendum)
The vascular surgery team felt that he did not have an infection in your arm and this was likely postoperative swelling and pain.  Your laboratory testing did not reflect severe infection.  They feel you are safe for discharge home.  Please follow-up with your vascular surgeon in the next few days.  If any symptoms change or worsen, please return to the nearest emergency department.

## 2018-01-16 NOTE — ED Notes (Addendum)
Dr. Scot Dock at bedside advised pt that she can go home.  Pt not interested in an IV at this time for Fentanyl or additional stick for blood culture, told pt I will ask Dr. Sherry Ruffing for PO medication

## 2018-01-18 ENCOUNTER — Telehealth: Payer: Self-pay | Admitting: Vascular Surgery

## 2018-01-18 DIAGNOSIS — N186 End stage renal disease: Secondary | ICD-10-CM | POA: Diagnosis not present

## 2018-01-18 DIAGNOSIS — I4891 Unspecified atrial fibrillation: Secondary | ICD-10-CM | POA: Diagnosis not present

## 2018-01-18 DIAGNOSIS — M150 Primary generalized (osteo)arthritis: Secondary | ICD-10-CM | POA: Diagnosis not present

## 2018-01-18 DIAGNOSIS — I872 Venous insufficiency (chronic) (peripheral): Secondary | ICD-10-CM | POA: Diagnosis not present

## 2018-01-18 DIAGNOSIS — E1122 Type 2 diabetes mellitus with diabetic chronic kidney disease: Secondary | ICD-10-CM | POA: Diagnosis not present

## 2018-01-18 DIAGNOSIS — M109 Gout, unspecified: Secondary | ICD-10-CM | POA: Diagnosis not present

## 2018-01-18 DIAGNOSIS — J449 Chronic obstructive pulmonary disease, unspecified: Secondary | ICD-10-CM | POA: Diagnosis not present

## 2018-01-18 DIAGNOSIS — L03115 Cellulitis of right lower limb: Secondary | ICD-10-CM | POA: Diagnosis not present

## 2018-01-18 DIAGNOSIS — E785 Hyperlipidemia, unspecified: Secondary | ICD-10-CM | POA: Diagnosis not present

## 2018-01-18 DIAGNOSIS — Z87891 Personal history of nicotine dependence: Secondary | ICD-10-CM | POA: Diagnosis not present

## 2018-01-18 DIAGNOSIS — Z794 Long term (current) use of insulin: Secondary | ICD-10-CM | POA: Diagnosis not present

## 2018-01-18 DIAGNOSIS — H409 Unspecified glaucoma: Secondary | ICD-10-CM | POA: Diagnosis not present

## 2018-01-18 DIAGNOSIS — M329 Systemic lupus erythematosus, unspecified: Secondary | ICD-10-CM | POA: Diagnosis not present

## 2018-01-18 DIAGNOSIS — Z992 Dependence on renal dialysis: Secondary | ICD-10-CM | POA: Diagnosis not present

## 2018-01-18 NOTE — Telephone Encounter (Signed)
Sched appt 02/02/18 at 9:00 w/ [facility]. Spoke to pt's husband. Pt has all day dialysis T,TH, SAT. They were ok with seeing PA. Resched to 02/10/18 at 2:00 with PA.

## 2018-01-18 NOTE — Telephone Encounter (Signed)
-----   Message from Mena Goes, RN sent at 01/18/2018  9:31 AM EDT ----- Regarding: 2-3 weeks for postop, may already be made   ----- Message ----- From: Angelia Mould, MD Sent: 01/16/2018   8:54 PM To: Vvs Charge Pool Subject: charge?                                        I saw this patient in the emergency department for left arm swelling.  She had surgery 3 days ago by Dr. Oneida Alar.  She needs to follow-up visit with him in 2-3 weeks.  Thank you. CD

## 2018-01-19 DIAGNOSIS — E876 Hypokalemia: Secondary | ICD-10-CM | POA: Diagnosis not present

## 2018-01-19 DIAGNOSIS — E039 Hypothyroidism, unspecified: Secondary | ICD-10-CM | POA: Diagnosis not present

## 2018-01-19 DIAGNOSIS — E1129 Type 2 diabetes mellitus with other diabetic kidney complication: Secondary | ICD-10-CM | POA: Diagnosis not present

## 2018-01-19 DIAGNOSIS — D689 Coagulation defect, unspecified: Secondary | ICD-10-CM | POA: Diagnosis not present

## 2018-01-19 DIAGNOSIS — N2581 Secondary hyperparathyroidism of renal origin: Secondary | ICD-10-CM | POA: Diagnosis not present

## 2018-01-19 DIAGNOSIS — N186 End stage renal disease: Secondary | ICD-10-CM | POA: Diagnosis not present

## 2018-01-19 DIAGNOSIS — Z23 Encounter for immunization: Secondary | ICD-10-CM | POA: Diagnosis not present

## 2018-01-19 DIAGNOSIS — D631 Anemia in chronic kidney disease: Secondary | ICD-10-CM | POA: Diagnosis not present

## 2018-01-19 NOTE — Progress Notes (Signed)
Tried to call pt's husband, no answer, no answering machine.

## 2018-01-19 NOTE — Progress Notes (Addendum)
At this point given trend toward IDA, heme + stool, right sided abd pain I would advised EGD with RMR.   Hold Eliquis for 48 hours prior to procedure.   PLEASE MAKE SURE YOU NOTIFY HUSBAND AS WELL AS [place].   PLEASE ALSO TELL HUSBAND THAT I REVIEWED HER RECORDS AND U/S IN 10/2017 SHOWED INDETERMINATE HYPOECHOIC STRUCTURE IN LATERAL UPPER RIGHT KIDNEY, PARTIALLY CYSTIC BUT CANNOT EXCLUDE SOLID COMPONENT. NOT SEE WELL ON NONCONTRAST CTS. RADIOLOGIST RECOMMENDS NONCONTRAST MRI. HE NEEDS TO ASK PCP OR KIDNEY DOCTOR TO FOLLOW UP ON THIS.

## 2018-01-19 NOTE — Progress Notes (Addendum)
Received additional information from the nursing home.   Blood work from 12/29/2017 showed hemoglobin of 7.8, hematocrit 26, MCV 94.2, platelets 290,000, white blood cell count 7900, BUNs 74, creatinine 4.56, albumin 2.1  Also had las 4/20 Lab Results  Component Value Date   WBC 7.0 01/16/2018   HGB 7.5 (L) 01/16/2018   HCT 25.2 (L) 01/16/2018   MCV 92.0 01/16/2018   PLT 209 01/16/2018   Lab Results  Component Value Date   CREATININE 1.48 (H) 01/16/2018   BUN 7 01/16/2018   NA 137 01/16/2018   K 3.8 01/16/2018   CL 99 (L) 01/16/2018   CO2 26 01/16/2018

## 2018-01-20 ENCOUNTER — Other Ambulatory Visit: Payer: Self-pay | Admitting: "Endocrinology

## 2018-01-20 ENCOUNTER — Other Ambulatory Visit: Payer: Self-pay

## 2018-01-20 DIAGNOSIS — Z992 Dependence on renal dialysis: Secondary | ICD-10-CM | POA: Diagnosis not present

## 2018-01-20 DIAGNOSIS — N186 End stage renal disease: Secondary | ICD-10-CM | POA: Diagnosis not present

## 2018-01-20 DIAGNOSIS — J449 Chronic obstructive pulmonary disease, unspecified: Secondary | ICD-10-CM | POA: Diagnosis not present

## 2018-01-20 DIAGNOSIS — N184 Chronic kidney disease, stage 4 (severe): Secondary | ICD-10-CM | POA: Diagnosis not present

## 2018-01-20 DIAGNOSIS — E785 Hyperlipidemia, unspecified: Secondary | ICD-10-CM | POA: Diagnosis not present

## 2018-01-20 DIAGNOSIS — D509 Iron deficiency anemia, unspecified: Secondary | ICD-10-CM

## 2018-01-20 DIAGNOSIS — H409 Unspecified glaucoma: Secondary | ICD-10-CM | POA: Diagnosis not present

## 2018-01-20 DIAGNOSIS — L03115 Cellulitis of right lower limb: Secondary | ICD-10-CM | POA: Diagnosis not present

## 2018-01-20 DIAGNOSIS — M150 Primary generalized (osteo)arthritis: Secondary | ICD-10-CM | POA: Diagnosis not present

## 2018-01-20 DIAGNOSIS — G8929 Other chronic pain: Secondary | ICD-10-CM

## 2018-01-20 DIAGNOSIS — R195 Other fecal abnormalities: Secondary | ICD-10-CM

## 2018-01-20 DIAGNOSIS — M109 Gout, unspecified: Secondary | ICD-10-CM | POA: Diagnosis not present

## 2018-01-20 DIAGNOSIS — M329 Systemic lupus erythematosus, unspecified: Secondary | ICD-10-CM | POA: Diagnosis not present

## 2018-01-20 DIAGNOSIS — E1122 Type 2 diabetes mellitus with diabetic chronic kidney disease: Secondary | ICD-10-CM | POA: Diagnosis not present

## 2018-01-20 DIAGNOSIS — I872 Venous insufficiency (chronic) (peripheral): Secondary | ICD-10-CM | POA: Diagnosis not present

## 2018-01-20 DIAGNOSIS — I4891 Unspecified atrial fibrillation: Secondary | ICD-10-CM | POA: Diagnosis not present

## 2018-01-20 DIAGNOSIS — R7309 Other abnormal glucose: Secondary | ICD-10-CM | POA: Diagnosis not present

## 2018-01-20 DIAGNOSIS — R1031 Right lower quadrant pain: Secondary | ICD-10-CM

## 2018-01-20 DIAGNOSIS — Z794 Long term (current) use of insulin: Secondary | ICD-10-CM | POA: Diagnosis not present

## 2018-01-20 DIAGNOSIS — Z87891 Personal history of nicotine dependence: Secondary | ICD-10-CM | POA: Diagnosis not present

## 2018-01-20 NOTE — Progress Notes (Signed)
Called pt's husband. She is no longer at [facility], home now. Informed him of area on right kidney and recommendations. EGD w/RMR scheduled for 03/24/18 at 12:45pm. Husband aware to hold Eliquis for 48 hours prior to procedure. Instructions mailed. Orders entered.

## 2018-01-21 DIAGNOSIS — D689 Coagulation defect, unspecified: Secondary | ICD-10-CM | POA: Diagnosis not present

## 2018-01-21 DIAGNOSIS — N2581 Secondary hyperparathyroidism of renal origin: Secondary | ICD-10-CM | POA: Diagnosis not present

## 2018-01-21 DIAGNOSIS — E039 Hypothyroidism, unspecified: Secondary | ICD-10-CM | POA: Diagnosis not present

## 2018-01-21 DIAGNOSIS — N186 End stage renal disease: Secondary | ICD-10-CM | POA: Diagnosis not present

## 2018-01-21 DIAGNOSIS — D631 Anemia in chronic kidney disease: Secondary | ICD-10-CM | POA: Diagnosis not present

## 2018-01-21 DIAGNOSIS — Z23 Encounter for immunization: Secondary | ICD-10-CM | POA: Diagnosis not present

## 2018-01-21 DIAGNOSIS — E1129 Type 2 diabetes mellitus with other diabetic kidney complication: Secondary | ICD-10-CM | POA: Diagnosis not present

## 2018-01-21 DIAGNOSIS — E876 Hypokalemia: Secondary | ICD-10-CM | POA: Diagnosis not present

## 2018-01-21 LAB — CULTURE, BLOOD (ROUTINE X 2)
Culture: NO GROWTH
Special Requests: ADEQUATE

## 2018-01-21 LAB — COMPREHENSIVE METABOLIC PANEL
A/G RATIO: 0.7 — AB (ref 1.2–2.2)
ALT: 3 IU/L (ref 0–32)
AST: 17 IU/L (ref 0–40)
Albumin: 2.5 g/dL — ABNORMAL LOW (ref 3.5–4.8)
Alkaline Phosphatase: 77 IU/L (ref 39–117)
BILIRUBIN TOTAL: 0.5 mg/dL (ref 0.0–1.2)
BUN/Creatinine Ratio: 9 — ABNORMAL LOW (ref 12–28)
BUN: 30 mg/dL — AB (ref 8–27)
CALCIUM: 8.5 mg/dL — AB (ref 8.7–10.3)
CHLORIDE: 96 mmol/L (ref 96–106)
CO2: 23 mmol/L (ref 20–29)
Creatinine, Ser: 3.17 mg/dL — ABNORMAL HIGH (ref 0.57–1.00)
GFR calc Af Amer: 16 mL/min/{1.73_m2} — ABNORMAL LOW (ref >59)
GFR calc non Af Amer: 14 mL/min/{1.73_m2} — ABNORMAL LOW (ref >59)
GLUCOSE: 206 mg/dL — AB (ref 65–99)
Globulin, Total: 3.4 g/dL (ref 1.5–4.5)
POTASSIUM: 4.2 mmol/L (ref 3.5–5.2)
Sodium: 136 mmol/L (ref 134–144)
Total Protein: 5.9 g/dL — ABNORMAL LOW (ref 6.0–8.5)

## 2018-01-21 LAB — HGB A1C W/O EAG: Hgb A1c MFr Bld: 5.3 % (ref 4.8–5.6)

## 2018-01-21 LAB — VITAMIN D 25 HYDROXY (VIT D DEFICIENCY, FRACTURES): VIT D 25 HYDROXY: 69.7 ng/mL (ref 30.0–100.0)

## 2018-01-21 LAB — SPECIMEN STATUS REPORT

## 2018-01-22 DIAGNOSIS — E1129 Type 2 diabetes mellitus with other diabetic kidney complication: Secondary | ICD-10-CM | POA: Diagnosis not present

## 2018-01-22 DIAGNOSIS — Z992 Dependence on renal dialysis: Secondary | ICD-10-CM | POA: Diagnosis not present

## 2018-01-22 DIAGNOSIS — H401123 Primary open-angle glaucoma, left eye, severe stage: Secondary | ICD-10-CM | POA: Diagnosis not present

## 2018-01-22 DIAGNOSIS — H401111 Primary open-angle glaucoma, right eye, mild stage: Secondary | ICD-10-CM | POA: Diagnosis not present

## 2018-01-22 DIAGNOSIS — E113293 Type 2 diabetes mellitus with mild nonproliferative diabetic retinopathy without macular edema, bilateral: Secondary | ICD-10-CM | POA: Diagnosis not present

## 2018-01-22 DIAGNOSIS — M329 Systemic lupus erythematosus, unspecified: Secondary | ICD-10-CM | POA: Diagnosis not present

## 2018-01-22 DIAGNOSIS — Z79899 Other long term (current) drug therapy: Secondary | ICD-10-CM | POA: Diagnosis not present

## 2018-01-23 DIAGNOSIS — N2581 Secondary hyperparathyroidism of renal origin: Secondary | ICD-10-CM | POA: Diagnosis not present

## 2018-01-23 DIAGNOSIS — D631 Anemia in chronic kidney disease: Secondary | ICD-10-CM | POA: Diagnosis not present

## 2018-01-23 DIAGNOSIS — Z23 Encounter for immunization: Secondary | ICD-10-CM | POA: Diagnosis not present

## 2018-01-23 DIAGNOSIS — E1129 Type 2 diabetes mellitus with other diabetic kidney complication: Secondary | ICD-10-CM | POA: Diagnosis not present

## 2018-01-23 DIAGNOSIS — E039 Hypothyroidism, unspecified: Secondary | ICD-10-CM | POA: Diagnosis not present

## 2018-01-23 DIAGNOSIS — D689 Coagulation defect, unspecified: Secondary | ICD-10-CM | POA: Diagnosis not present

## 2018-01-23 DIAGNOSIS — N186 End stage renal disease: Secondary | ICD-10-CM | POA: Diagnosis not present

## 2018-01-23 DIAGNOSIS — E876 Hypokalemia: Secondary | ICD-10-CM | POA: Diagnosis not present

## 2018-01-25 DIAGNOSIS — Z794 Long term (current) use of insulin: Secondary | ICD-10-CM | POA: Diagnosis not present

## 2018-01-25 DIAGNOSIS — Z992 Dependence on renal dialysis: Secondary | ICD-10-CM | POA: Diagnosis not present

## 2018-01-25 DIAGNOSIS — H409 Unspecified glaucoma: Secondary | ICD-10-CM | POA: Diagnosis not present

## 2018-01-25 DIAGNOSIS — J449 Chronic obstructive pulmonary disease, unspecified: Secondary | ICD-10-CM | POA: Diagnosis not present

## 2018-01-25 DIAGNOSIS — E1151 Type 2 diabetes mellitus with diabetic peripheral angiopathy without gangrene: Secondary | ICD-10-CM | POA: Diagnosis not present

## 2018-01-25 DIAGNOSIS — I4891 Unspecified atrial fibrillation: Secondary | ICD-10-CM | POA: Diagnosis not present

## 2018-01-25 DIAGNOSIS — M109 Gout, unspecified: Secondary | ICD-10-CM | POA: Diagnosis not present

## 2018-01-25 DIAGNOSIS — E1140 Type 2 diabetes mellitus with diabetic neuropathy, unspecified: Secondary | ICD-10-CM | POA: Diagnosis not present

## 2018-01-25 DIAGNOSIS — E1122 Type 2 diabetes mellitus with diabetic chronic kidney disease: Secondary | ICD-10-CM | POA: Diagnosis not present

## 2018-01-25 DIAGNOSIS — I872 Venous insufficiency (chronic) (peripheral): Secondary | ICD-10-CM | POA: Diagnosis not present

## 2018-01-25 DIAGNOSIS — M329 Systemic lupus erythematosus, unspecified: Secondary | ICD-10-CM | POA: Diagnosis not present

## 2018-01-25 DIAGNOSIS — M150 Primary generalized (osteo)arthritis: Secondary | ICD-10-CM | POA: Diagnosis not present

## 2018-01-25 DIAGNOSIS — N186 End stage renal disease: Secondary | ICD-10-CM | POA: Diagnosis not present

## 2018-01-25 DIAGNOSIS — L03115 Cellulitis of right lower limb: Secondary | ICD-10-CM | POA: Diagnosis not present

## 2018-01-25 DIAGNOSIS — Z87891 Personal history of nicotine dependence: Secondary | ICD-10-CM | POA: Diagnosis not present

## 2018-01-25 DIAGNOSIS — L89892 Pressure ulcer of other site, stage 2: Secondary | ICD-10-CM | POA: Diagnosis not present

## 2018-01-25 DIAGNOSIS — E785 Hyperlipidemia, unspecified: Secondary | ICD-10-CM | POA: Diagnosis not present

## 2018-01-26 DIAGNOSIS — N186 End stage renal disease: Secondary | ICD-10-CM | POA: Diagnosis not present

## 2018-01-26 DIAGNOSIS — Z23 Encounter for immunization: Secondary | ICD-10-CM | POA: Diagnosis not present

## 2018-01-26 DIAGNOSIS — D631 Anemia in chronic kidney disease: Secondary | ICD-10-CM | POA: Diagnosis not present

## 2018-01-26 DIAGNOSIS — N2581 Secondary hyperparathyroidism of renal origin: Secondary | ICD-10-CM | POA: Diagnosis not present

## 2018-01-26 DIAGNOSIS — E876 Hypokalemia: Secondary | ICD-10-CM | POA: Diagnosis not present

## 2018-01-26 DIAGNOSIS — E039 Hypothyroidism, unspecified: Secondary | ICD-10-CM | POA: Diagnosis not present

## 2018-01-26 DIAGNOSIS — D689 Coagulation defect, unspecified: Secondary | ICD-10-CM | POA: Diagnosis not present

## 2018-01-26 DIAGNOSIS — E1129 Type 2 diabetes mellitus with other diabetic kidney complication: Secondary | ICD-10-CM | POA: Diagnosis not present

## 2018-01-27 ENCOUNTER — Ambulatory Visit: Payer: Medicare Other | Admitting: "Endocrinology

## 2018-01-27 ENCOUNTER — Encounter: Payer: Self-pay | Admitting: "Endocrinology

## 2018-01-27 VITALS — BP 137/67 | HR 66 | Ht 64.0 in

## 2018-01-27 DIAGNOSIS — H409 Unspecified glaucoma: Secondary | ICD-10-CM | POA: Diagnosis not present

## 2018-01-27 DIAGNOSIS — E782 Mixed hyperlipidemia: Secondary | ICD-10-CM | POA: Diagnosis not present

## 2018-01-27 DIAGNOSIS — Z87891 Personal history of nicotine dependence: Secondary | ICD-10-CM | POA: Diagnosis not present

## 2018-01-27 DIAGNOSIS — M109 Gout, unspecified: Secondary | ICD-10-CM | POA: Diagnosis not present

## 2018-01-27 DIAGNOSIS — I872 Venous insufficiency (chronic) (peripheral): Secondary | ICD-10-CM | POA: Diagnosis not present

## 2018-01-27 DIAGNOSIS — Z794 Long term (current) use of insulin: Secondary | ICD-10-CM | POA: Diagnosis not present

## 2018-01-27 DIAGNOSIS — N186 End stage renal disease: Secondary | ICD-10-CM | POA: Diagnosis not present

## 2018-01-27 DIAGNOSIS — N184 Chronic kidney disease, stage 4 (severe): Secondary | ICD-10-CM | POA: Diagnosis not present

## 2018-01-27 DIAGNOSIS — M150 Primary generalized (osteo)arthritis: Secondary | ICD-10-CM | POA: Diagnosis not present

## 2018-01-27 DIAGNOSIS — J449 Chronic obstructive pulmonary disease, unspecified: Secondary | ICD-10-CM | POA: Diagnosis not present

## 2018-01-27 DIAGNOSIS — I4891 Unspecified atrial fibrillation: Secondary | ICD-10-CM | POA: Diagnosis not present

## 2018-01-27 DIAGNOSIS — R7309 Other abnormal glucose: Secondary | ICD-10-CM | POA: Diagnosis not present

## 2018-01-27 DIAGNOSIS — L03115 Cellulitis of right lower limb: Secondary | ICD-10-CM | POA: Diagnosis not present

## 2018-01-27 DIAGNOSIS — Z992 Dependence on renal dialysis: Secondary | ICD-10-CM | POA: Diagnosis not present

## 2018-01-27 DIAGNOSIS — E785 Hyperlipidemia, unspecified: Secondary | ICD-10-CM | POA: Diagnosis not present

## 2018-01-27 DIAGNOSIS — M329 Systemic lupus erythematosus, unspecified: Secondary | ICD-10-CM | POA: Diagnosis not present

## 2018-01-27 DIAGNOSIS — I10 Essential (primary) hypertension: Secondary | ICD-10-CM

## 2018-01-27 DIAGNOSIS — E1122 Type 2 diabetes mellitus with diabetic chronic kidney disease: Secondary | ICD-10-CM

## 2018-01-27 NOTE — Progress Notes (Signed)
Subjective:    Patient ID: [name], female    DOB: Oct 15, 1945,    Past Medical History:  Diagnosis Date  . Anemia   . Arthritis   . Asthma   . Atrial fibrillation ([place])   . Atrial flutter [facility])    TEE/DCCV December 2013 - on Xarelto [facility])  . Bone spur of toe    Right 5th toe  . Chronic diastolic CHF (congestive heart failure) (HCC)    a. EF 50-55% by echo in 2015  . CKD (chronic Little disease) stage 4, GFR 15-29 ml/min (HCC)    a. s/p fistula placement but not on HD. Followed by Nephrology.   . Colon polyposis   . COPD (chronic obstructive pulmonary disease) ([place])   . DVT of upper extremity (deep vein thrombosis) ([place])    Right basilic vein, June 7425  . Essential hypertension, benign   . Fractures   . Glaucoma   . SLE (systemic lupus erythematosus) ([place])   . Type 2 diabetes mellitus ([place])   . UTI (lower urinary tract infection)    Past Surgical History:  Procedure Laterality Date  . ABDOMINAL AORTOGRAM W/LOWER EXTREMITY N/A 12/18/2017   Procedure: ABDOMINAL AORTOGRAM W/LOWER EXTREMITY;  Surgeon: Elam Dutch, MD;  Location: [place] [facility];  Service: Cardiovascular;  Laterality: N/A;  bilateral  . ABDOMINAL HYSTERECTOMY  1983  . ANKLE SURGERY  1993  . AV FISTULA PLACEMENT Left 03/27/2014   Procedure: ARTERIOVENOUS (AV) FISTULA CREATION;  Surgeon: Rosetta Posner, MD;  Location: [place];  Service: Vascular;  Laterality: Left;  . BACK SURGERY  1980  . BASCILIC VEIN TRANSPOSITION Left 01/13/2018   Procedure: LEFT BASILIC VEIN TRANSPOSITION SECOND STAGE;  Surgeon: Elam Dutch, MD;  Location: [place];  Service: Vascular;  Laterality: Left;  . CARDIOVASCULAR STRESS TEST  12/19/2009   no stress induced rhythm abnormalities, ekg negative for ischemia  . CARDIOVERSION  09/16/2012   Procedure: CARDIOVERSION;  Surgeon: Sanda Klein, MD;  Location: [facility] ENDOSCOPY;  Service: Cardiovascular;  Laterality: N/A;  . COLONOSCOPY  09/20/2012   Dr. [name]: multiple  tubular adenomas   . COLONOSCOPY  2005   Dr. [name]. Polyps, path unknown   . COLONOSCOPY N/A 07/24/2016   Procedure: COLONOSCOPY;  Surgeon: [name], MD;  [facility];  Service: Endoscopy;  Laterality: N/A;  9:00 am  . ESOPHAGOGASTRODUODENOSCOPY  09/20/2012   Dr. [name]: duodenal erosion and possible resolving ulcer at the angularis   . IR FLUORO GUIDE CV LINE RIGHT  11/09/2017  . IR US GUIDE VASC ACCESS RIGHT  11/09/2017  . TEE WITHOUT CARDIOVERSION  09/16/2012   Procedure: TRANSESOPHAGEAL ECHOCARDIOGRAM (TEE);  Surgeon: Sanda Klein, MD;  Location: [facility] ENDOSCOPY;  Service: Cardiovascular;  Laterality: N/A;  . TRANSESOPHAGEAL ECHOCARDIOGRAM WITH CARDIOVERSION  09/16/2012   EF 60-65%, moderate LVH, moderate regurg of the aortic valve, LA moderately dilated   Social History   Socioeconomic History  . Marital status: Married    Spouse name: Not on file  . Number of children: Not on file  . Years of education: Not on file  . Highest education level: Not on file  Occupational History  . Not on file  Social Needs  . Financial resource strain: Not on file  . Food insecurity:    Worry: Not on file    Inability: Not on file  . Transportation needs:    Medical: Not on file    Non-medical: Not on file  Tobacco Use  . Smoking status: Former Smoker    Packs/day: 1.00    Years: 30.00    Pack years: 30.00    Types: Cigarettes    Last attempt to quit: 08/29/2012    Years since quitting: 5.4  . Smokeless tobacco: Never Used  . Tobacco comment: quit 08-2012  Substance and Sexual Activity  . Alcohol use: No    Alcohol/week: 0.0 oz  . Drug use: No  . Sexual activity: Yes    Partners: Male  Lifestyle  . Physical activity:    Days per week: Not on file    Minutes per session: Not on file  . Stress: Not on file  Relationships  . Social connections:    Talks on phone: Not on file    Gets together: Not on file    Attends religious service: Not on file    Active  member of club or organization: Not on file    Attends meetings of clubs or organizations: Not on file    Relationship status: Not on file  Other Topics Concern  . Not on file  Social History Narrative  . Not on file   Outpatient Encounter Medications as of 01/27/2018  Medication Sig  . Insulin Detemir (LEVEMIR FLEXTOUCH) 100 UNIT/ML Pen Inject 10 Units into the skin at bedtime.  . insulin lispro (HUMALOG KWIKPEN) 100 UNIT/ML KiwkPen Inject 5 Units into the skin 3 (three) times daily with meals as needed.  Marland Kitchen acetaminophen (TYLENOL) 500 MG tablet Take 1,000 mg by mouth every 6 (six) hours as needed. For pain  . albuterol (ACCUNEB) 0.63 MG/3ML nebulizer solution Take 1 ampule by nebulization every 6 (six) hours as needed for wheezing or shortness of breath.   Marland Kitchen albuterol (PROVENTIL HFA;VENTOLIN HFA) 108 (90 BASE) MCG/ACT inhaler Inhale 2 puffs into the lungs every 6 (six) hours as needed. For shortness of breath  . Amino Acids-Protein Hydrolys (FEEDING SUPPLEMENT, PRO-STAT SUGAR FREE 64,) LIQD Take 30 mLs by mouth 2 (two) times daily.  Marland Kitchen apixaban (ELIQUIS) 5 MG TABS tablet Take 1 tablet (5 mg total) by mouth 2 (two) times daily.  Marland Kitchen atorvastatin (LIPITOR) 20 MG tablet Take 20 mg by mouth daily.  . brimonidine-timolol (COMBIGAN) 0.2-0.5 % ophthalmic solution Place 1 drop into the left eye daily.   . diclofenac (VOLTAREN) 0.1 % ophthalmic solution Place 1 drop into the left eye daily. Uses at lunch time  . dicyclomine (BENTYL) 10 MG capsule Take 10 mg by mouth 4 (four) times daily -  before meals and at bedtime.  . febuxostat (ULORIC) 40 MG tablet Take 40 mg by mouth daily.  . folic acid (FOLVITE) 1 MG tablet Take 1 mg by mouth daily.  . hydroxychloroquine (PLAQUENIL) 200 MG tablet Take 300 mg by mouth as directed. Take 1 1/2 tablets daily  . metoprolol tartrate (LOPRESSOR) 50 MG tablet Take 50 mg by mouth 2 (two) times daily. Take 1 tablet twice a day, except on Tuesday, Thursday, Saturday, take 1  tablet daily in the morning.  . Multiple Vitamins-Minerals (CENTRUM SILVER PO) Take 1 tablet by mouth daily.  Marland Kitchen oxyCODONE-acetaminophen (PERCOCET/ROXICET) 5-325 MG tablet Take 1 tablet by mouth every 6 (six) hours as needed for severe pain.  . predniSONE (DELTASONE) 5 MG tablet Take 5 mg by mouth daily with breakfast.  . travoprost, benzalkonium, (TRAVATAN) 0.004 % ophthalmic solution Place 1 drop into the left eye at bedtime.  . [DISCONTINUED] insulin aspart (NOVOLOG) 100 UNIT/ML injection Inject 0-9 Units into  the skin 3 (three) times daily with meals.  . [DISCONTINUED] Nutritional Supplements (FEEDING SUPPLEMENT, NEPRO CARB STEADY,) LIQD Take 237 mLs by mouth daily. (Patient not taking: Reported on 01/16/2018)   No facility-administered encounter medications on file as of 01/27/2018.    ALLERGIES: Allergies  Allergen Reactions  . Ace Inhibitors Cough   VACCINATION STATUS: Immunization History  Administered Date(s) Administered  . Influenza,inj,Quad PF,6+ Mos 07/03/2014  . Influenza-Unspecified 07/11/2015, 07/08/2016  . PPD Test 11/07/2017  . Pneumococcal Polysaccharide-23 11/04/2017    Diabetes  She presents for her follow-up diabetic visit. She has type 2 diabetes mellitus. Onset time: diagnosed at approx age of 86. Her disease course has been worsening. Pertinent negatives for hypoglycemia include no confusion, headaches, pallor or seizures. Associated symptoms include fatigue, polydipsia and polyuria. Pertinent negatives for diabetes include no chest pain and no polyphagia. Symptoms are worsening. Diabetic complications include nephropathy. Risk factors for coronary artery disease include dyslipidemia, diabetes mellitus and obesity. Current diabetic treatment includes insulin injections. She is compliant with treatment most of the time. Her weight is stable. She is following a generally unhealthy diet. She has had a previous visit with a dietitian. She never participates in exercise. Her  home blood glucose trend is increasing steadily. Her breakfast blood glucose range is generally 140-180 mg/dl. Her lunch blood glucose range is generally 180-200 mg/dl. Her dinner blood glucose range is generally 180-200 mg/dl. Her bedtime blood glucose range is generally 180-200 mg/dl. Her overall blood glucose range is 180-200 mg/dl. An ACE inhibitor/angiotensin II receptor blocker is being taken.  Hypertension  This is a chronic problem. The current episode started more than 1 year ago. The problem is controlled. Pertinent negatives include no chest pain, headaches, palpitations or shortness of breath. Risk factors for coronary artery disease include obesity, dyslipidemia, diabetes mellitus and sedentary lifestyle. Hypertensive end-organ damage includes Little disease. Identifiable causes of hypertension include chronic renal disease.  Hyperlipidemia  This is a chronic problem. The current episode started more than 1 year ago. Exacerbating diseases include chronic renal disease, diabetes and obesity. Pertinent negatives include no chest pain, myalgias or shortness of breath. Current antihyperlipidemic treatment includes statins. Risk factors for coronary artery disease include diabetes mellitus, dyslipidemia, hypertension, a sedentary lifestyle, family history and post-menopausal.     Review of Systems  Constitutional: Positive for fatigue. Negative for unexpected weight change.  HENT: Negative for trouble swallowing and voice change.   Eyes: Negative for visual disturbance.  Respiratory: Negative for cough, shortness of breath and wheezing.   Cardiovascular: Negative for chest pain, palpitations and leg swelling.  Gastrointestinal: Negative for diarrhea, nausea and vomiting.  Endocrine: Positive for polydipsia and polyuria. Negative for cold intolerance, heat intolerance and polyphagia.  Musculoskeletal: Negative for arthralgias and myalgias.  Skin: Negative for color change, pallor, rash and  wound.  Neurological: Negative for seizures and headaches.  Psychiatric/Behavioral: Negative for confusion and suicidal ideas.    Objective:    BP 137/67   Pulse 66   Ht 5\' 4"  (1.626 m)   BMI 30.73 kg/m   Wt Readings from Last 3 Encounters:  01/13/18 179 lb (81.2 kg)  01/11/18 179 lb (81.2 kg)  01/07/18 167 lb (75.8 kg)    Physical Exam  Constitutional: She is oriented to person, place, and time. She appears well-developed.  HENT:  Head: Normocephalic and atraumatic.  Eyes: EOM are normal.  Neck: Normal range of motion. Neck supple. No tracheal deviation present. No thyromegaly present.  Cardiovascular: Normal rate and  regular rhythm.  Pulmonary/Chest: Effort normal and breath sounds normal.  Abdominal: Soft. Bowel sounds are normal. There is no tenderness. There is no guarding.  Musculoskeletal: Normal range of motion. She exhibits no edema.  Neurological: She is alert and oriented to person, place, and time. She has normal reflexes. No cranial nerve deficit. Coordination normal.  Skin: Skin is warm and dry. No rash noted. No erythema. No pallor.  Psychiatric: She has a normal mood and affect. Judgment normal.    Chemistry (most recent): Lab Results  Component Value Date   NA 136 01/20/2018   K 4.2 01/20/2018   CL 96 01/20/2018   CO2 23 01/20/2018   BUN 30 (H) 01/20/2018   CREATININE 3.17 (H) 01/20/2018   Diabetic Labs (most recent): Lab Results  Component Value Date   HGBA1C 5.3 01/20/2018   HGBA1C 5.8 (H) 12/15/2017   HGBA1C 5.6 10/20/2017      Assessment & Plan:   1. Type 2 diabetes mellitus with stage 5 chronic Little disease- status post fistula placement for hemodialysis. - She is not started on hemodialysis. Patient is advised to stick to a routine mealtimes to eat 3 meals  a day and avoid unnecessary snacks ( to snack only to correct hypoglycemia). - Her recent labs show A1c of 5.3%, however she has severe anemia with hemoglobin of 7.5 which might  have underestimated her A1c.  Her blood glucose readings are consistent with near target fasting but significantly above target postprandial blood glucose readings.    - She has history of iron deficiency anemia, Getting iron infusion at [facility]. Patient is advised to eliminate simple carbs  from their diet including cakes desserts ice cream soda (  diet and regular) , sweet tea , Candies,  chips and cookies, artificial sweeteners,   and "sugar-free" products .  This will help patient to have stable blood glucose profile and potentially lose weight. Patient is given detailed personalized glucose monitoring and insulin dosing instructions. Patient is instructed to call back with extremes of blood glucose less than 70 or greater than 300. Patient to bring meter and  blood glucose logs during their next visit.  I will continue Levemir 10 units daily at bedtime, readjust her Humalog to start when her pre-meal blood glucose is 150 mg/dL or above.  She is advised to continue strict monitoring of blood glucose 4 times a day-before meals and at bedtime.   -She is advised to call clinic if blood glucose readings are less than 70 or greater than 3003.  2. Essential hypertension, benign - Her blood pressure is Controlled to target.  I have advised her to continue current medications including metoprolol 50 mg p.o. twice daily.    3. Hyperlipemia No recent fasting lipid panel.  I have advised her to continue atorvastatatin.  She will have fasting lipid panel before next visit.  4. Morbid obesity Carbs advise given. See above #1.  5. vitamin D deficiency.  -Continue current Vitamin D supplement.  6) Chronic Care/Health Maintenance:  -Patient is on  Statin medications and encouraged to continue to follow up with Ophthalmology, nephrology for end-stage renal disease status post fistula placement, Podiatrist at least yearly or according to recommendations, and advised to   stay away from smoking. I have  recommended yearly flu vaccine and pneumonia vaccination at least every 5 years; and  sleep for at least 7 hours a day. - She is advised to avoid Vitamin B 12 supplement.  - Time spent with  the patient: 25 min, of which >50% was spent in reviewing her blood glucose logs , discussing her hypo- and hyper-glycemic episodes, reviewing her current and  previous labs and insulin doses and developing a plan to avoid hypo- and hyper-glycemia. Please refer to Patient Instructions for Blood Glucose Monitoring and Insulin/Medications Dosing Guide"  in media tab for additional information. [name] participated in the discussions, expressed understanding, and voiced agreement with the above plans.  All questions were answered to her satisfaction. she is encouraged to contact clinic should she have any questions or concerns prior to her return visit.  Follow up plan: Return in about 1 week (around 02/03/2018) for follow up with meter and logs- no labs.  Glade Lloyd, MD Phone: [phone]  Fax: [phone]  -  This note was partially dictated with voice recognition software. Similar sounding words can be transcribed inadequately or may not  be corrected upon review.  01/27/2018, 5:05 PM

## 2018-01-28 DIAGNOSIS — D631 Anemia in chronic kidney disease: Secondary | ICD-10-CM | POA: Diagnosis not present

## 2018-01-28 DIAGNOSIS — N2581 Secondary hyperparathyroidism of renal origin: Secondary | ICD-10-CM | POA: Diagnosis not present

## 2018-01-28 DIAGNOSIS — D689 Coagulation defect, unspecified: Secondary | ICD-10-CM | POA: Diagnosis not present

## 2018-01-28 DIAGNOSIS — D509 Iron deficiency anemia, unspecified: Secondary | ICD-10-CM | POA: Diagnosis not present

## 2018-01-28 DIAGNOSIS — E1129 Type 2 diabetes mellitus with other diabetic kidney complication: Secondary | ICD-10-CM | POA: Diagnosis not present

## 2018-01-28 DIAGNOSIS — N186 End stage renal disease: Secondary | ICD-10-CM | POA: Diagnosis not present

## 2018-01-28 DIAGNOSIS — E876 Hypokalemia: Secondary | ICD-10-CM | POA: Diagnosis not present

## 2018-01-28 DIAGNOSIS — Z23 Encounter for immunization: Secondary | ICD-10-CM | POA: Diagnosis not present

## 2018-01-29 DIAGNOSIS — Z87891 Personal history of nicotine dependence: Secondary | ICD-10-CM | POA: Diagnosis not present

## 2018-01-29 DIAGNOSIS — E1122 Type 2 diabetes mellitus with diabetic chronic kidney disease: Secondary | ICD-10-CM | POA: Diagnosis not present

## 2018-01-29 DIAGNOSIS — M150 Primary generalized (osteo)arthritis: Secondary | ICD-10-CM | POA: Diagnosis not present

## 2018-01-29 DIAGNOSIS — E785 Hyperlipidemia, unspecified: Secondary | ICD-10-CM | POA: Diagnosis not present

## 2018-01-29 DIAGNOSIS — Z794 Long term (current) use of insulin: Secondary | ICD-10-CM | POA: Diagnosis not present

## 2018-01-29 DIAGNOSIS — I872 Venous insufficiency (chronic) (peripheral): Secondary | ICD-10-CM | POA: Diagnosis not present

## 2018-01-29 DIAGNOSIS — J449 Chronic obstructive pulmonary disease, unspecified: Secondary | ICD-10-CM | POA: Diagnosis not present

## 2018-01-29 DIAGNOSIS — M329 Systemic lupus erythematosus, unspecified: Secondary | ICD-10-CM | POA: Diagnosis not present

## 2018-01-29 DIAGNOSIS — L03115 Cellulitis of right lower limb: Secondary | ICD-10-CM | POA: Diagnosis not present

## 2018-01-29 DIAGNOSIS — N186 End stage renal disease: Secondary | ICD-10-CM | POA: Diagnosis not present

## 2018-01-29 DIAGNOSIS — Z992 Dependence on renal dialysis: Secondary | ICD-10-CM | POA: Diagnosis not present

## 2018-01-29 DIAGNOSIS — H409 Unspecified glaucoma: Secondary | ICD-10-CM | POA: Diagnosis not present

## 2018-01-29 DIAGNOSIS — I4891 Unspecified atrial fibrillation: Secondary | ICD-10-CM | POA: Diagnosis not present

## 2018-01-29 DIAGNOSIS — M109 Gout, unspecified: Secondary | ICD-10-CM | POA: Diagnosis not present

## 2018-01-29 NOTE — Progress Notes (Signed)
[facility] Note  02/01/2018     CHIEF COMPLAINT Patient presents for Retina Evaluation and Diabetic Eye Exam   HISTORY OF PRESENT ILLNESS: [name] is a 72 y.o. female who presents to the clinic today for:   HPI    Retina Evaluation    In both eyes.  This started 30 years ago.  Associated Symptoms Floaters.  Negative for Blind Spot, Glare, Shoulder/Hip pain, Fatigue, Jaw Claudication, Photophobia, Distortion, Redness, Scalp Tenderness, Weight Loss, Fever, Trauma, Pain and Flashes.  Context:  distance vision, near vision and mid-range vision.  Treatments tried include laser, injection and surgery.  Response to treatment was mild improvement.  I, the attending physician,  performed the HPI with the patient and updated documentation appropriately.          Diabetic Eye Exam    Vision fluctuates with blood sugars.  Associated Symptoms Floaters.  Negative for Blind Spot, Glare, Shoulder/Hip pain, Fatigue, Jaw Claudication, Photophobia, Distortion, Redness, Scalp Tenderness, Weight Loss, Fever, Trauma, Pain and Flashes.  Diabetes characteristics include Type 2 and on insulin.  This started 30 years ago.  Blood sugar level fluctuates.  Last Blood Glucose 230.  Associated Diagnosis Dialysis and Kidney Disease.  I, the attending physician,  performed the HPI with the patient and updated documentation appropriately.          Comments    Referral of Dr. Kathlen Mody for DME. Patient accompanied by her husband today. Patient states she has occasional floaters os,denies ocular pain and distortion. Pt  has been a diabetic for appx 30 yrs, bs this am 230's, her bs fluctuates, they are controlled by insulin.Pt reports she had cataract sx os, laser and  injections, unsure of dates,with a little improvement. Pt uses Com bigan, Travtan,Voltaren  eye gtt's.Denies vit's       Last edited by Bernarda Caffey, MD on 02/01/2018  2:18 PM. (History)    Pt states she saw Dr.  Kathlen Mody only once; Pt states she was told as a child that she had a lazy eye; Pt states she "has always had a lazy eye"; Pt states CBG is not stable, states blood sugars "go up and down"; Pt states she "used to see Dr. Cordelia Pen" and hasnt been seen by Dr. Cordelia Pen since "the first of the year"; Pt states she has had treatment with Dr. Cordelia Pen previously; Pt states she has gotten laser treatments and injections; pt reports Dr. Herbert Deaner performed cataract sx OU; Pt reports she has seen Dr. Cordelia Pen for "a long time", around 5 years, states she was previously seeing Dr. Silvestre Gunner;   Referring physician: Hortencia Pilar, MD [address], [place] [address]  HISTORICAL INFORMATION:   Selected notes from the MEDICAL RECORD NUMBER Referred by Dr. Kathlen Mody for concern of BDR LEE: 04.26.19 Read Drivers) [BCVA: OD: 20/30 OS: 20/50+2 Ocular Hx-Glaucoma OU, Pseudophakia OU, s/p YAP PC OS, floaters OU, diabetic retinopathy, HTN Ret, hx of iritis PMH- DM, HTN, lupus, arthritis, asthma  Previous exams by Silvestre Gunner and Blanchie Serve at [facility]: Current Outpatient Medications (Ophthalmic Drugs)  Medication Sig  . brimonidine-timolol (COMBIGAN) 0.2-0.5 % ophthalmic solution Place 1 drop into the left eye daily.   . diclofenac (VOLTAREN) 0.1 % ophthalmic solution Place 1 drop into the left eye daily. Uses at lunch time  . travoprost, benzalkonium, (TRAVATAN) 0.004 % ophthalmic solution Place 1 drop into the left eye at bedtime.   No current facility-administered  medications for this visit.  (Ophthalmic Drugs)   Current Outpatient Medications (Other)  Medication Sig  . acetaminophen (TYLENOL) 500 MG tablet Take 1,000 mg by mouth every 6 (six) hours as needed. For pain  . albuterol (ACCUNEB) 0.63 MG/3ML nebulizer solution Take 1 ampule by nebulization every 6 (six) hours as needed for wheezing or shortness of breath.   Marland Kitchen albuterol (PROVENTIL HFA;VENTOLIN HFA) 108 (90 BASE) MCG/ACT inhaler Inhale  2 puffs into the lungs every 6 (six) hours as needed. For shortness of breath  . Amino Acids-Protein Hydrolys (FEEDING SUPPLEMENT, PRO-STAT SUGAR FREE 64,) LIQD Take 30 mLs by mouth 2 (two) times daily.  Marland Kitchen apixaban (ELIQUIS) 5 MG TABS tablet Take 1 tablet (5 mg total) by mouth 2 (two) times daily.  Marland Kitchen atorvastatin (LIPITOR) 20 MG tablet Take 20 mg by mouth daily.  Marland Kitchen dicyclomine (BENTYL) 10 MG capsule Take 10 mg by mouth 4 (four) times daily -  before meals and at bedtime.  . febuxostat (ULORIC) 40 MG tablet Take 40 mg by mouth daily.  . folic acid (FOLVITE) 1 MG tablet Take 1 mg by mouth daily.  . hydroxychloroquine (PLAQUENIL) 200 MG tablet Take 300 mg by mouth as directed. Take 1 1/2 tablets daily  . Insulin Detemir (LEVEMIR FLEXTOUCH) 100 UNIT/ML Pen Inject 10 Units into the skin at bedtime.  . insulin lispro (HUMALOG KWIKPEN) 100 UNIT/ML KiwkPen Inject 5 Units into the skin 3 (three) times daily with meals as needed.  . metoprolol tartrate (LOPRESSOR) 50 MG tablet Take 50 mg by mouth 2 (two) times daily. Take 1 tablet twice a day, except on Tuesday, Thursday, Saturday, take 1 tablet daily in the morning.  . Multiple Vitamins-Minerals (CENTRUM SILVER PO) Take 1 tablet by mouth daily.  Marland Kitchen oxyCODONE-acetaminophen (PERCOCET/ROXICET) 5-325 MG tablet Take 1 tablet by mouth every 6 (six) hours as needed for severe pain.  . predniSONE (DELTASONE) 5 MG tablet Take 5 mg by mouth daily with breakfast.   No current facility-administered medications for this visit.  (Other)      REVIEW OF SYSTEMS: ROS    Positive for: Musculoskeletal, Endocrine, Cardiovascular, Eyes, Respiratory, Heme/Lymph   Negative for: Constitutional, Gastrointestinal, Neurological, Skin, Genitourinary, HENT, Psychiatric, Allergic/Imm   Last edited by Zenovia Jordan, LPN on 10/04/1094  0:45 PM. (History)       ALLERGIES Allergies  Allergen Reactions  . Ace Inhibitors Cough    PAST MEDICAL HISTORY Past Medical History:   Diagnosis Date  . Anemia   . Arthritis   . Asthma   . Atrial fibrillation ([place])   . Atrial flutter [facility])    TEE/DCCV December 2013 - on Xarelto [facility])  . Bone spur of toe    Right 5th toe  . Chronic diastolic CHF (congestive heart failure) (HCC)    a. EF 50-55% by echo in 2015  . CKD (chronic kidney disease) stage 4, GFR 15-29 ml/min (HCC)    a. s/p fistula placement but not on HD. Followed by Nephrology.   . Colon polyposis   . COPD (chronic obstructive pulmonary disease) ([place])   . DVT of upper extremity (deep vein thrombosis) ([place])    Right basilic vein, June 4098  . Essential hypertension, benign   . Fractures   . Glaucoma   . SLE (systemic lupus erythematosus) ([place])   . Type 2 diabetes mellitus ([place])   . UTI (lower urinary tract infection)    Past Surgical History:  Procedure Laterality Date  . ABDOMINAL AORTOGRAM W/LOWER EXTREMITY N/A  12/18/2017   Procedure: ABDOMINAL AORTOGRAM W/LOWER EXTREMITY;  Surgeon: Elam Dutch, MD;  Location: [place] [facility];  Service: Cardiovascular;  Laterality: N/A;  bilateral  . ABDOMINAL HYSTERECTOMY  1983  . ANKLE SURGERY  1993  . AV FISTULA PLACEMENT Left 03/27/2014   Procedure: ARTERIOVENOUS (AV) FISTULA CREATION;  Surgeon: Rosetta Posner, MD;  Location: [facility];  Service: Vascular;  Laterality: Left;  . BACK SURGERY  1980  . BASCILIC VEIN TRANSPOSITION Left 01/13/2018   Procedure: LEFT BASILIC VEIN TRANSPOSITION SECOND STAGE;  Surgeon: Elam Dutch, MD;  Location: [facility];  Service: Vascular;  Laterality: Left;  . CARDIOVASCULAR STRESS TEST  12/19/2009   no stress induced rhythm abnormalities, ekg negative for ischemia  . CARDIOVERSION  09/16/2012   Procedure: CARDIOVERSION;  Surgeon: Sanda Klein, MD;  [facility];  Service: Cardiovascular;  Laterality: N/A;  . CATARACT EXTRACTION    . COLONOSCOPY  09/20/2012   Dr. [name]: multiple tubular adenomas   . COLONOSCOPY  2005   Dr. [name]. Polyps, path unknown   .  COLONOSCOPY N/A 07/24/2016   Procedure: COLONOSCOPY;  Surgeon: [name], MD;  [facility];  Service: Endoscopy;  Laterality: N/A;  9:00 am  . ESOPHAGOGASTRODUODENOSCOPY  09/20/2012   Dr. [name]: duodenal erosion and possible resolving ulcer at the angularis   . EYE SURGERY    . IR FLUORO GUIDE CV LINE RIGHT  11/09/2017  . IR US GUIDE VASC ACCESS RIGHT  11/09/2017  . TEE WITHOUT CARDIOVERSION  09/16/2012   Procedure: TRANSESOPHAGEAL ECHOCARDIOGRAM (TEE);  Surgeon: Sanda Klein, MD;  Location: [facility] ENDOSCOPY;  Service: Cardiovascular;  Laterality: N/A;  . TRANSESOPHAGEAL ECHOCARDIOGRAM WITH CARDIOVERSION  09/16/2012   EF 60-65%, moderate LVH, moderate regurg of the aortic valve, LA moderately dilated    FAMILY HISTORY Family History  Problem Relation Age of Onset  . Diabetes Brother   . Diabetes Father   . Hypertension Father   . Hypertension Sister   . Stroke Mother   . Diabetes Sister   . Hypertension Brother   . Colon cancer Neg Hx     SOCIAL HISTORY Social History   Tobacco Use  . Smoking status: Former Smoker    Packs/day: 1.00    Years: 30.00    Pack years: 30.00    Types: Cigarettes    Last attempt to quit: 08/29/2012    Years since quitting: 5.4  . Smokeless tobacco: Never Used  . Tobacco comment: quit 08-2012  Substance Use Topics  . Alcohol use: No    Alcohol/week: 0.0 oz  . Drug use: No         OPHTHALMIC EXAM:  Base Eye Exam    Visual Acuity (Snellen - Linear)      Right Left   Dist cc 20/70 20/80 -1   Dist ph cc 20/50 +1 NI   Correction:  Glasses       Tonometry (Tonopen, 1:17 PM)      Right Left   Pressure 08 06       Pupils      Dark Light Shape React APD   Right 5 4.5 Round Minimal None   Left 5 4.5 Round Minimal None       Visual Fields (Counting fingers)      Left Right    Full Full       Extraocular Movement      Right Left    Full Full  L XT  Neuro/Psych    Oriented x3:  Yes   Mood/Affect:   Normal       Dilation    Both eyes:  1.0% Mydriacyl, 2.5% Phenylephrine @ 1:17 PM        Slit Lamp and Fundus Exam    Slit Lamp Exam      Right Left   Lids/Lashes Dermatochalasis - upper lid, mild Meibomian gland dysfunction Dermatochalasis - upper lid, mild Meibomian gland dysfunction   Conjunctiva/Sclera White and quiet White and quiet   Cornea Arcus Arcus   Anterior Chamber Deep and quiet Deep and quiet   Iris Round and dilated, No NVI Round and dilated, No NVI   Lens PC IOL in good position three piece sulcus IOL with optic capture, open PC   Vitreous Vitreous syneresis Vitreous syneresis       Fundus Exam      Right Left   Disc Pink and Sharp, no NVD Pink and Sharp, mild Pallor   C/D Ratio 0.2 0.2   Macula Blunted foveal reflex, Retinal pigment epithelial mottling, focal laser scars temporal to fovea, scattered Microaneurysms Blunted foveal reflex, Retinal pigment epithelial mottling, focal laser scars, rare Microaneurysms, mild Epiretinal membrane   Vessels Vascular attenuation, Tortuous Vascular attenuation, Tortuous, AV crossing changes   Periphery Attached, scattered DBH Attached, scattered DBH        Refraction    Wearing Rx      Sphere Cylinder Axis Add   Right -1.50 Sphere  +2.50   Left -1.25 +0.75 142 +2.50       Manifest Refraction      Sphere Cylinder Axis Dist VA   Right -1.50 Sphere  20/50-2   Left -1.25 +0.75 142 20/100-2          IMAGING AND PROCEDURES  Imaging and Procedures for 02/01/18  OCT, Retina - OU - Both Eyes       Right Eye Quality was good. Central Foveal Thickness: 266. Progression has no prior data. Findings include abnormal foveal contour, no IRF, no SRF, outer retinal atrophy, epiretinal membrane, vitreomacular adhesion  (Trace cystic changes associated with VMA, focal areas of ORA).   Left Eye Quality was good. Central Foveal Thickness: 197. Progression has no prior data. Findings include abnormal foveal contour, epiretinal  membrane, intraretinal fluid, no SRF, outer retinal atrophy (Mild IRF/ DME temporal to fovea).   Notes *Images captured and stored on drive  Diagnosis / Impression:  OD: No DME, mild VMA with trace cystic changes OS: non-central DME and ERM with abnormal contour  Clinical management:  See below  Abbreviations: NFP - Normal foveal profile. CME - cystoid macular edema. PED - pigment epithelial detachment. IRF - intraretinal fluid. SRF - subretinal fluid. EZ - ellipsoid zone. ERM - epiretinal membrane. ORA - outer retinal atrophy. ORT - outer retinal tubulation. SRHM - subretinal hyper-reflective material                  ASSESSMENT/PLAN:    ICD-10-CM   1. Moderate nonproliferative diabetic retinopathy of both eyes without macular edema associated with type 2 diabetes mellitus ([place]) A63.0160   2. Retinal edema H35.81 OCT, Retina - OU - Both Eyes  3. Epiretinal membrane (ERM) of left eye H35.372   4. Pseudophakia of both eyes Z96.1     1,2. Moderate nonproliferative diabetic retinopathy, both eyes - former pt of Drs. Cordelia Pen and [facility] at [facility] - s/p focal laser and intravitreal injections - The incidence, risk  factors for progression, natural history and treatment options for diabetic retinopathy were discussed with patient.   - The need for close monitoring of blood glucose, blood pressure, and serum lipids, avoiding cigarette or any type of tobacco, and the need for long term follow up was also discussed with patient. - exam shows scattered [place] -- no NV - OCT without diabetic macular edema OD, mild noncentral edema OS - f/u in 2-3 mos with FA (Optos transit OS)  3. Epiretinal membrane, left eye  The natural history, anatomy, potential for loss of vision, and treatment options including vitrectomy techniques and the complications of endophthalmitis, retinal detachment, vitreous hemorrhage, cataract progression and permanent vision loss discussed  with the patient. - mild ERM - not visually significant - no surgical intervention indicated at this time - monitor  4. Pseudophakia OU  - s/p CE/IOL OU Herbert Deaner)  - history of complex cataract surgery OS with retained lens fragment  - ?history of PPV/PPL OS w/ [place] -- will [facility] to obtain [facility]  - doing well -- PCIOLs in good position and stable  - monitor    Ophthalmic Meds Ordered this visit:  No orders of the defined types were placed in this encounter.      Return in about 3 months (around 05/04/2018) for F/U DM, DFE, OCT, FA.  There are no Patient Instructions on file for this visit.   Explained the diagnoses, plan, and follow up with the patient and they expressed understanding.  Patient expressed understanding of the importance of proper follow up care.   This document serves as a record of services personally performed by Gardiner Sleeper, MD, PhD. It was created on their behalf by Ernest Mallick, OA, an ophthalmic assistant. The creation of this record is the provider's dictation and/or activities during the visit.    Electronically signed by: Ernest Mallick, OA  01/29/2018 10:42 PM    Gardiner Sleeper, M.D., Ph.D. Diseases & Surgery of the Retina and [place] 02/01/18   I have reviewed the above documentation for accuracy and completeness, and I agree with the above. Gardiner Sleeper, M.D., Ph.D. 02/01/18 10:51 PM   Abbreviations: M myopia (nearsighted); A astigmatism; H hyperopia (farsighted); P presbyopia; Mrx spectacle prescription;  CTL contact lenses; OD right eye; OS left eye; OU both eyes  XT exotropia; ET esotropia; PEK punctate epithelial keratitis; PEE punctate epithelial erosions; DES dry eye syndrome; MGD meibomian gland dysfunction; ATs artificial tears; PFAT's preservative free artificial tears; [place] nuclear sclerotic cataract; PSC posterior subcapsular cataract; ERM epi-retinal membrane; PVD  posterior vitreous detachment; RD retinal detachment; DM diabetes mellitus; DR diabetic retinopathy; NPDR non-proliferative diabetic retinopathy; PDR proliferative diabetic retinopathy; CSME clinically significant macular edema; DME diabetic macular edema; dbh dot blot hemorrhages; CWS cotton wool spot; POAG primary open angle glaucoma; C/D cup-to-disc ratio; HVF humphrey visual field; GVF goldmann visual field; OCT optical coherence tomography; IOP intraocular pressure; BRVO Branch retinal vein occlusion; CRVO central retinal vein occlusion; CRAO central retinal artery occlusion; BRAO branch retinal artery occlusion; RT retinal tear; SB scleral buckle; PPV pars plana vitrectomy; VH Vitreous hemorrhage; PRP panretinal laser photocoagulation; IVK intravitreal kenalog; VMT vitreomacular traction; MH Macular hole;  NVD neovascularization of the disc; NVE neovascularization elsewhere; AREDS age related eye disease study; ARMD age related macular degeneration; POAG primary open angle glaucoma; EBMD epithelial/anterior basement membrane dystrophy; ACIOL anterior chamber intraocular lens; IOL intraocular lens; PCIOL posterior chamber intraocular lens; Phaco/IOL phacoemulsification  with intraocular lens placement; [place] photorefractive keratectomy; LASIK laser assisted in situ keratomileusis; HTN hypertension; DM diabetes mellitus; COPD chronic obstructive pulmonary disease

## 2018-01-30 DIAGNOSIS — D509 Iron deficiency anemia, unspecified: Secondary | ICD-10-CM | POA: Diagnosis not present

## 2018-01-30 DIAGNOSIS — E876 Hypokalemia: Secondary | ICD-10-CM | POA: Diagnosis not present

## 2018-01-30 DIAGNOSIS — N186 End stage renal disease: Secondary | ICD-10-CM | POA: Diagnosis not present

## 2018-01-30 DIAGNOSIS — D689 Coagulation defect, unspecified: Secondary | ICD-10-CM | POA: Diagnosis not present

## 2018-01-30 DIAGNOSIS — D631 Anemia in chronic kidney disease: Secondary | ICD-10-CM | POA: Diagnosis not present

## 2018-01-30 DIAGNOSIS — E1129 Type 2 diabetes mellitus with other diabetic kidney complication: Secondary | ICD-10-CM | POA: Diagnosis not present

## 2018-01-30 DIAGNOSIS — N2581 Secondary hyperparathyroidism of renal origin: Secondary | ICD-10-CM | POA: Diagnosis not present

## 2018-01-30 DIAGNOSIS — Z23 Encounter for immunization: Secondary | ICD-10-CM | POA: Diagnosis not present

## 2018-02-01 ENCOUNTER — Encounter (INDEPENDENT_AMBULATORY_CARE_PROVIDER_SITE_OTHER): Payer: Self-pay | Admitting: Ophthalmology

## 2018-02-01 ENCOUNTER — Ambulatory Visit (INDEPENDENT_AMBULATORY_CARE_PROVIDER_SITE_OTHER): Payer: Medicare Other | Admitting: Ophthalmology

## 2018-02-01 DIAGNOSIS — H35372 Puckering of macula, left eye: Secondary | ICD-10-CM | POA: Diagnosis not present

## 2018-02-01 DIAGNOSIS — M150 Primary generalized (osteo)arthritis: Secondary | ICD-10-CM | POA: Diagnosis not present

## 2018-02-01 DIAGNOSIS — H3581 Retinal edema: Secondary | ICD-10-CM

## 2018-02-01 DIAGNOSIS — Z961 Presence of intraocular lens: Secondary | ICD-10-CM

## 2018-02-01 DIAGNOSIS — M329 Systemic lupus erythematosus, unspecified: Secondary | ICD-10-CM | POA: Diagnosis not present

## 2018-02-01 DIAGNOSIS — I872 Venous insufficiency (chronic) (peripheral): Secondary | ICD-10-CM | POA: Diagnosis not present

## 2018-02-01 DIAGNOSIS — Z794 Long term (current) use of insulin: Secondary | ICD-10-CM | POA: Diagnosis not present

## 2018-02-01 DIAGNOSIS — E113393 Type 2 diabetes mellitus with moderate nonproliferative diabetic retinopathy without macular edema, bilateral: Secondary | ICD-10-CM

## 2018-02-01 DIAGNOSIS — E785 Hyperlipidemia, unspecified: Secondary | ICD-10-CM | POA: Diagnosis not present

## 2018-02-01 DIAGNOSIS — M109 Gout, unspecified: Secondary | ICD-10-CM | POA: Diagnosis not present

## 2018-02-01 DIAGNOSIS — Z992 Dependence on renal dialysis: Secondary | ICD-10-CM | POA: Diagnosis not present

## 2018-02-01 DIAGNOSIS — J449 Chronic obstructive pulmonary disease, unspecified: Secondary | ICD-10-CM | POA: Diagnosis not present

## 2018-02-01 DIAGNOSIS — I4891 Unspecified atrial fibrillation: Secondary | ICD-10-CM | POA: Diagnosis not present

## 2018-02-01 DIAGNOSIS — Z87891 Personal history of nicotine dependence: Secondary | ICD-10-CM | POA: Diagnosis not present

## 2018-02-01 DIAGNOSIS — H409 Unspecified glaucoma: Secondary | ICD-10-CM | POA: Diagnosis not present

## 2018-02-01 DIAGNOSIS — L03115 Cellulitis of right lower limb: Secondary | ICD-10-CM | POA: Diagnosis not present

## 2018-02-01 DIAGNOSIS — N186 End stage renal disease: Secondary | ICD-10-CM | POA: Diagnosis not present

## 2018-02-01 DIAGNOSIS — E1122 Type 2 diabetes mellitus with diabetic chronic kidney disease: Secondary | ICD-10-CM | POA: Diagnosis not present

## 2018-02-02 ENCOUNTER — Encounter: Payer: Medicare Other | Admitting: Vascular Surgery

## 2018-02-02 DIAGNOSIS — E1129 Type 2 diabetes mellitus with other diabetic kidney complication: Secondary | ICD-10-CM | POA: Diagnosis not present

## 2018-02-02 DIAGNOSIS — N2581 Secondary hyperparathyroidism of renal origin: Secondary | ICD-10-CM | POA: Diagnosis not present

## 2018-02-02 DIAGNOSIS — D631 Anemia in chronic kidney disease: Secondary | ICD-10-CM | POA: Diagnosis not present

## 2018-02-02 DIAGNOSIS — E876 Hypokalemia: Secondary | ICD-10-CM | POA: Diagnosis not present

## 2018-02-02 DIAGNOSIS — N186 End stage renal disease: Secondary | ICD-10-CM | POA: Diagnosis not present

## 2018-02-02 DIAGNOSIS — D509 Iron deficiency anemia, unspecified: Secondary | ICD-10-CM | POA: Diagnosis not present

## 2018-02-02 DIAGNOSIS — D689 Coagulation defect, unspecified: Secondary | ICD-10-CM | POA: Diagnosis not present

## 2018-02-02 DIAGNOSIS — Z23 Encounter for immunization: Secondary | ICD-10-CM | POA: Diagnosis not present

## 2018-02-03 DIAGNOSIS — M1990 Unspecified osteoarthritis, unspecified site: Secondary | ICD-10-CM | POA: Diagnosis not present

## 2018-02-03 DIAGNOSIS — I509 Heart failure, unspecified: Secondary | ICD-10-CM | POA: Diagnosis not present

## 2018-02-03 DIAGNOSIS — J449 Chronic obstructive pulmonary disease, unspecified: Secondary | ICD-10-CM | POA: Diagnosis not present

## 2018-02-04 DIAGNOSIS — N186 End stage renal disease: Secondary | ICD-10-CM | POA: Diagnosis not present

## 2018-02-04 DIAGNOSIS — N2581 Secondary hyperparathyroidism of renal origin: Secondary | ICD-10-CM | POA: Diagnosis not present

## 2018-02-04 DIAGNOSIS — E876 Hypokalemia: Secondary | ICD-10-CM | POA: Diagnosis not present

## 2018-02-04 DIAGNOSIS — E1129 Type 2 diabetes mellitus with other diabetic kidney complication: Secondary | ICD-10-CM | POA: Diagnosis not present

## 2018-02-04 DIAGNOSIS — D509 Iron deficiency anemia, unspecified: Secondary | ICD-10-CM | POA: Diagnosis not present

## 2018-02-04 DIAGNOSIS — D631 Anemia in chronic kidney disease: Secondary | ICD-10-CM | POA: Diagnosis not present

## 2018-02-04 DIAGNOSIS — D689 Coagulation defect, unspecified: Secondary | ICD-10-CM | POA: Diagnosis not present

## 2018-02-04 DIAGNOSIS — Z23 Encounter for immunization: Secondary | ICD-10-CM | POA: Diagnosis not present

## 2018-02-05 DIAGNOSIS — E785 Hyperlipidemia, unspecified: Secondary | ICD-10-CM | POA: Diagnosis not present

## 2018-02-05 DIAGNOSIS — J449 Chronic obstructive pulmonary disease, unspecified: Secondary | ICD-10-CM | POA: Diagnosis not present

## 2018-02-05 DIAGNOSIS — D649 Anemia, unspecified: Secondary | ICD-10-CM | POA: Diagnosis not present

## 2018-02-05 DIAGNOSIS — I4891 Unspecified atrial fibrillation: Secondary | ICD-10-CM | POA: Diagnosis not present

## 2018-02-05 DIAGNOSIS — M109 Gout, unspecified: Secondary | ICD-10-CM | POA: Diagnosis not present

## 2018-02-05 DIAGNOSIS — N189 Chronic kidney disease, unspecified: Secondary | ICD-10-CM | POA: Diagnosis not present

## 2018-02-05 DIAGNOSIS — M329 Systemic lupus erythematosus, unspecified: Secondary | ICD-10-CM | POA: Diagnosis not present

## 2018-02-05 DIAGNOSIS — I872 Venous insufficiency (chronic) (peripheral): Secondary | ICD-10-CM | POA: Diagnosis not present

## 2018-02-05 DIAGNOSIS — M159 Polyosteoarthritis, unspecified: Secondary | ICD-10-CM | POA: Diagnosis not present

## 2018-02-05 DIAGNOSIS — Z794 Long term (current) use of insulin: Secondary | ICD-10-CM | POA: Diagnosis not present

## 2018-02-05 DIAGNOSIS — L03115 Cellulitis of right lower limb: Secondary | ICD-10-CM | POA: Diagnosis not present

## 2018-02-05 DIAGNOSIS — E1122 Type 2 diabetes mellitus with diabetic chronic kidney disease: Secondary | ICD-10-CM | POA: Diagnosis not present

## 2018-02-05 DIAGNOSIS — M150 Primary generalized (osteo)arthritis: Secondary | ICD-10-CM | POA: Diagnosis not present

## 2018-02-05 DIAGNOSIS — Z7952 Long term (current) use of systemic steroids: Secondary | ICD-10-CM | POA: Diagnosis not present

## 2018-02-05 DIAGNOSIS — Z87891 Personal history of nicotine dependence: Secondary | ICD-10-CM | POA: Diagnosis not present

## 2018-02-05 DIAGNOSIS — N186 End stage renal disease: Secondary | ICD-10-CM | POA: Diagnosis not present

## 2018-02-05 DIAGNOSIS — H409 Unspecified glaucoma: Secondary | ICD-10-CM | POA: Diagnosis not present

## 2018-02-05 DIAGNOSIS — Z992 Dependence on renal dialysis: Secondary | ICD-10-CM | POA: Diagnosis not present

## 2018-02-06 DIAGNOSIS — Z23 Encounter for immunization: Secondary | ICD-10-CM | POA: Diagnosis not present

## 2018-02-06 DIAGNOSIS — D509 Iron deficiency anemia, unspecified: Secondary | ICD-10-CM | POA: Diagnosis not present

## 2018-02-06 DIAGNOSIS — D631 Anemia in chronic kidney disease: Secondary | ICD-10-CM | POA: Diagnosis not present

## 2018-02-06 DIAGNOSIS — N2581 Secondary hyperparathyroidism of renal origin: Secondary | ICD-10-CM | POA: Diagnosis not present

## 2018-02-06 DIAGNOSIS — E1129 Type 2 diabetes mellitus with other diabetic kidney complication: Secondary | ICD-10-CM | POA: Diagnosis not present

## 2018-02-06 DIAGNOSIS — E876 Hypokalemia: Secondary | ICD-10-CM | POA: Diagnosis not present

## 2018-02-06 DIAGNOSIS — N186 End stage renal disease: Secondary | ICD-10-CM | POA: Diagnosis not present

## 2018-02-06 DIAGNOSIS — D689 Coagulation defect, unspecified: Secondary | ICD-10-CM | POA: Diagnosis not present

## 2018-02-08 ENCOUNTER — Ambulatory Visit (INDEPENDENT_AMBULATORY_CARE_PROVIDER_SITE_OTHER): Payer: Medicare Other | Admitting: "Endocrinology

## 2018-02-08 ENCOUNTER — Encounter: Payer: Self-pay | Admitting: "Endocrinology

## 2018-02-08 DIAGNOSIS — Z992 Dependence on renal dialysis: Secondary | ICD-10-CM | POA: Diagnosis not present

## 2018-02-08 DIAGNOSIS — H409 Unspecified glaucoma: Secondary | ICD-10-CM | POA: Diagnosis not present

## 2018-02-08 DIAGNOSIS — E782 Mixed hyperlipidemia: Secondary | ICD-10-CM | POA: Diagnosis not present

## 2018-02-08 DIAGNOSIS — I872 Venous insufficiency (chronic) (peripheral): Secondary | ICD-10-CM | POA: Diagnosis not present

## 2018-02-08 DIAGNOSIS — I4891 Unspecified atrial fibrillation: Secondary | ICD-10-CM | POA: Diagnosis not present

## 2018-02-08 DIAGNOSIS — N186 End stage renal disease: Secondary | ICD-10-CM | POA: Diagnosis not present

## 2018-02-08 DIAGNOSIS — M150 Primary generalized (osteo)arthritis: Secondary | ICD-10-CM | POA: Diagnosis not present

## 2018-02-08 DIAGNOSIS — Z87891 Personal history of nicotine dependence: Secondary | ICD-10-CM | POA: Diagnosis not present

## 2018-02-08 DIAGNOSIS — N184 Chronic kidney disease, stage 4 (severe): Secondary | ICD-10-CM

## 2018-02-08 DIAGNOSIS — M329 Systemic lupus erythematosus, unspecified: Secondary | ICD-10-CM | POA: Diagnosis not present

## 2018-02-08 DIAGNOSIS — M109 Gout, unspecified: Secondary | ICD-10-CM | POA: Diagnosis not present

## 2018-02-08 DIAGNOSIS — E785 Hyperlipidemia, unspecified: Secondary | ICD-10-CM | POA: Diagnosis not present

## 2018-02-08 DIAGNOSIS — I10 Essential (primary) hypertension: Secondary | ICD-10-CM | POA: Diagnosis not present

## 2018-02-08 DIAGNOSIS — L03115 Cellulitis of right lower limb: Secondary | ICD-10-CM | POA: Diagnosis not present

## 2018-02-08 DIAGNOSIS — E1122 Type 2 diabetes mellitus with diabetic chronic kidney disease: Secondary | ICD-10-CM

## 2018-02-08 DIAGNOSIS — Z794 Long term (current) use of insulin: Secondary | ICD-10-CM | POA: Diagnosis not present

## 2018-02-08 DIAGNOSIS — J449 Chronic obstructive pulmonary disease, unspecified: Secondary | ICD-10-CM | POA: Diagnosis not present

## 2018-02-08 NOTE — Patient Instructions (Signed)

## 2018-02-08 NOTE — Progress Notes (Signed)
Subjective:    Patient ID: [name], female    DOB: 10-Jun-1946,    Past Medical History:  Diagnosis Date  . Anemia   . Arthritis   . Asthma   . Atrial fibrillation ([place])   . Atrial flutter [facility])    TEE/DCCV December 2013 - on Xarelto [facility])  . Bone spur of toe    Right 5th toe  . Chronic diastolic CHF (congestive heart failure) (HCC)    a. EF 50-55% by echo in 2015  . CKD (chronic Little disease) stage 4, GFR 15-29 ml/min (HCC)    a. s/p fistula placement but not on HD. Followed by Nephrology.   . Colon polyposis   . COPD (chronic obstructive pulmonary disease) ([place])   . DVT of upper extremity (deep vein thrombosis) ([place])    Right basilic vein, June 0932  . Essential hypertension, benign   . Fractures   . Glaucoma   . SLE (systemic lupus erythematosus) ([place])   . Type 2 diabetes mellitus ([place])   . UTI (lower urinary tract infection)    Past Surgical History:  Procedure Laterality Date  . ABDOMINAL AORTOGRAM W/LOWER EXTREMITY N/A 12/18/2017   Procedure: ABDOMINAL AORTOGRAM W/LOWER EXTREMITY;  Surgeon: Elam Dutch, MD;  Location: [place] [facility];  Service: Cardiovascular;  Laterality: N/A;  bilateral  . ABDOMINAL HYSTERECTOMY  1983  . ANKLE SURGERY  1993  . AV FISTULA PLACEMENT Left 03/27/2014   Procedure: ARTERIOVENOUS (AV) FISTULA CREATION;  Surgeon: Rosetta Posner, MD;  Location: [place];  Service: Vascular;  Laterality: Left;  . BACK SURGERY  1980  . BASCILIC VEIN TRANSPOSITION Left 01/13/2018   Procedure: LEFT BASILIC VEIN TRANSPOSITION SECOND STAGE;  Surgeon: Elam Dutch, MD;  Location: [place];  Service: Vascular;  Laterality: Left;  . CARDIOVASCULAR STRESS TEST  12/19/2009   no stress induced rhythm abnormalities, ekg negative for ischemia  . CARDIOVERSION  09/16/2012   Procedure: CARDIOVERSION;  Surgeon: Sanda Klein, MD;  Location: [facility];  Service: Cardiovascular;  Laterality: N/A;  . CATARACT EXTRACTION    . COLONOSCOPY  09/20/2012    Dr. [name]: multiple tubular adenomas   . COLONOSCOPY  2005   Dr. [name]. Polyps, path unknown   . COLONOSCOPY N/A 07/24/2016   Procedure: COLONOSCOPY;  Surgeon: [name], MD;  [facility];  Service: Endoscopy;  Laterality: N/A;  9:00 am  . ESOPHAGOGASTRODUODENOSCOPY  09/20/2012   Dr. [name]: duodenal erosion and possible resolving ulcer at the angularis   . EYE SURGERY    . IR FLUORO GUIDE CV LINE RIGHT  11/09/2017  . IR US GUIDE VASC ACCESS RIGHT  11/09/2017  . TEE WITHOUT CARDIOVERSION  09/16/2012   Procedure: TRANSESOPHAGEAL ECHOCARDIOGRAM (TEE);  Surgeon: Sanda Klein, MD;  Location: [facility] ENDOSCOPY;  Service: Cardiovascular;  Laterality: N/A;  . TRANSESOPHAGEAL ECHOCARDIOGRAM WITH CARDIOVERSION  09/16/2012   EF 60-65%, moderate LVH, moderate regurg of the aortic valve, LA moderately dilated   Social History   Socioeconomic History  . Marital status: Married    Spouse name: Not on file  . Number of children: Not on file  . Years of education: Not on file  . Highest education level: Not on file  Occupational History  . Not on file  Social Needs  . Financial resource strain: Not on file  . Food insecurity:    Worry: Not on file    Inability: Not on file  . Transportation needs:  Medical: Not on file    Non-medical: Not on file  Tobacco Use  . Smoking status: Former Smoker    Packs/day: 1.00    Years: 30.00    Pack years: 30.00    Types: Cigarettes    Last attempt to quit: 08/29/2012    Years since quitting: 5.4  . Smokeless tobacco: Never Used  . Tobacco comment: quit 08-2012  Substance and Sexual Activity  . Alcohol use: No    Alcohol/week: 0.0 oz  . Drug use: No  . Sexual activity: Yes    Partners: Male  Lifestyle  . Physical activity:    Days per week: Not on file    Minutes per session: Not on file  . Stress: Not on file  Relationships  . Social connections:    Talks on phone: Not on file    Gets together: Not on file    Attends  religious service: Not on file    Active member of club or organization: Not on file    Attends meetings of clubs or organizations: Not on file    Relationship status: Not on file  Other Topics Concern  . Not on file  Social History Narrative  . Not on file   Outpatient Encounter Medications as of 02/08/2018  Medication Sig  . calcium acetate (PHOSLO) 667 MG capsule Take 667 mg by mouth 3 (three) times daily with meals.  . silver sulfADIAZINE (SILVADENE) 1 % cream Apply 1 application topically daily.  Marland Kitchen acetaminophen (TYLENOL) 500 MG tablet Take 1,000 mg by mouth every 6 (six) hours as needed. For pain  . albuterol (ACCUNEB) 0.63 MG/3ML nebulizer solution Take 1 ampule by nebulization every 6 (six) hours as needed for wheezing or shortness of breath.   Marland Kitchen albuterol (PROVENTIL HFA;VENTOLIN HFA) 108 (90 BASE) MCG/ACT inhaler Inhale 2 puffs into the lungs every 6 (six) hours as needed. For shortness of breath  . Amino Acids-Protein Hydrolys (FEEDING SUPPLEMENT, PRO-STAT SUGAR FREE 64,) LIQD Take 30 mLs by mouth 2 (two) times daily.  Marland Kitchen apixaban (ELIQUIS) 5 MG TABS tablet Take 1 tablet (5 mg total) by mouth 2 (two) times daily.  Marland Kitchen atorvastatin (LIPITOR) 20 MG tablet Take 20 mg by mouth daily.  . brimonidine-timolol (COMBIGAN) 0.2-0.5 % ophthalmic solution Place 1 drop into the left eye daily.   . diclofenac (VOLTAREN) 0.1 % ophthalmic solution Place 1 drop into the left eye daily. Uses at lunch time  . dicyclomine (BENTYL) 10 MG capsule Take 10 mg by mouth 4 (four) times daily -  before meals and at bedtime.  . febuxostat (ULORIC) 40 MG tablet Take 40 mg by mouth daily.  . folic acid (FOLVITE) 1 MG tablet Take 1 mg by mouth daily.  . hydroxychloroquine (PLAQUENIL) 200 MG tablet Take 300 mg by mouth as directed. Take 1 1/2 tablets daily  . Insulin Detemir (LEVEMIR FLEXTOUCH) 100 UNIT/ML Pen Inject 10 Units into the skin at bedtime.  . insulin lispro (HUMALOG KWIKPEN) 100 UNIT/ML KiwkPen Inject 5  Units into the skin 3 (three) times daily with meals as needed.  . metoprolol tartrate (LOPRESSOR) 50 MG tablet Take 50 mg by mouth 2 (two) times daily. Take 1 tablet twice a day, except on Tuesday, Thursday, Saturday, take 1 tablet daily in the morning.  . Multiple Vitamins-Minerals (CENTRUM SILVER PO) Take 1 tablet by mouth daily.  Marland Kitchen oxyCODONE-acetaminophen (PERCOCET/ROXICET) 5-325 MG tablet Take 1 tablet by mouth every 6 (six) hours as needed for severe pain.  Marland Kitchen  predniSONE (DELTASONE) 5 MG tablet Take 5 mg by mouth daily with breakfast.  . travoprost, benzalkonium, (TRAVATAN) 0.004 % ophthalmic solution Place 1 drop into the left eye at bedtime.   No facility-administered encounter medications on file as of 02/08/2018.    ALLERGIES: Allergies  Allergen Reactions  . Ace Inhibitors Cough   VACCINATION STATUS: Immunization History  Administered Date(s) Administered  . Influenza,inj,Quad PF,6+ Mos 07/03/2014  . Influenza-Unspecified 07/11/2015, 07/08/2016  . PPD Test 11/07/2017  . Pneumococcal Polysaccharide-23 11/04/2017    Diabetes  She presents for her follow-up diabetic visit. She has type 2 diabetes mellitus. Onset time: diagnosed at approx age of 62. Her disease course has been improving. Pertinent negatives for hypoglycemia include no confusion, headaches, pallor or seizures. Associated symptoms include fatigue, polydipsia and polyuria. Pertinent negatives for diabetes include no chest pain and no polyphagia. Symptoms are improving. Diabetic complications include nephropathy. Risk factors for coronary artery disease include dyslipidemia, diabetes mellitus and obesity. Current diabetic treatment includes insulin injections. She is compliant with treatment most of the time. Her weight is fluctuating minimally. She is following a generally unhealthy diet. She has had a previous visit with a dietitian. She never participates in exercise. Her home blood glucose trend is increasing steadily.  Her breakfast blood glucose range is generally 130-140 mg/dl. Her lunch blood glucose range is generally 140-180 mg/dl. Her dinner blood glucose range is generally 140-180 mg/dl. Her bedtime blood glucose range is generally 180-200 mg/dl. Her overall blood glucose range is 180-200 mg/dl. An ACE inhibitor/angiotensin II receptor blocker is being taken.  Hypertension  This is a chronic problem. The current episode started more than 1 year ago. The problem is controlled. Pertinent negatives include no chest pain, headaches, palpitations or shortness of breath. Risk factors for coronary artery disease include obesity, dyslipidemia, diabetes mellitus and sedentary lifestyle. Hypertensive end-organ damage includes Little disease. Identifiable causes of hypertension include chronic renal disease.  Hyperlipidemia  This is a chronic problem. The current episode started more than 1 year ago. Exacerbating diseases include chronic renal disease, diabetes and obesity. Pertinent negatives include no chest pain, myalgias or shortness of breath. Current antihyperlipidemic treatment includes statins. Risk factors for coronary artery disease include diabetes mellitus, dyslipidemia, hypertension, a sedentary lifestyle, family history and post-menopausal.    Review of Systems  Constitutional: Positive for fatigue. Negative for unexpected weight change.  HENT: Negative for trouble swallowing and voice change.   Eyes: Negative for visual disturbance.  Respiratory: Negative for cough, shortness of breath and wheezing.   Cardiovascular: Negative for chest pain, palpitations and leg swelling.  Gastrointestinal: Negative for diarrhea, nausea and vomiting.  Endocrine: Positive for polydipsia and polyuria. Negative for cold intolerance, heat intolerance and polyphagia.  Musculoskeletal: Negative for arthralgias and myalgias.  Skin: Negative for color change, pallor, rash and wound.  Neurological: Negative for seizures and  headaches.  Psychiatric/Behavioral: Negative for confusion and suicidal ideas.    Objective:    There were no vitals taken for this visit.  Wt Readings from Last 3 Encounters:  01/13/18 179 lb (81.2 kg)  01/11/18 179 lb (81.2 kg)  01/07/18 167 lb (75.8 kg)    Physical Exam  Constitutional: She is oriented to person, place, and time. She appears well-developed.  HENT:  Head: Normocephalic and atraumatic.  Eyes: EOM are normal.  Neck: Normal range of motion. Neck supple. No tracheal deviation present. No thyromegaly present.  Cardiovascular: Normal rate and regular rhythm.  Pulmonary/Chest: Effort normal and breath sounds normal.  Abdominal: Soft. Bowel sounds are normal. There is no tenderness. There is no guarding.  Musculoskeletal: Normal range of motion. She exhibits no edema.  Neurological: She is alert and oriented to person, place, and time. She has normal reflexes. No cranial nerve deficit. Coordination normal.  Skin: Skin is warm and dry. No rash noted. No erythema. No pallor.  Psychiatric: She has a normal mood and affect. Judgment normal.    Chemistry (most recent): Lab Results  Component Value Date   NA 136 01/20/2018   K 4.2 01/20/2018   CL 96 01/20/2018   CO2 23 01/20/2018   BUN 30 (H) 01/20/2018   CREATININE 3.17 (H) 01/20/2018   Diabetic Labs (most recent): Lab Results  Component Value Date   HGBA1C 5.3 01/20/2018   HGBA1C 5.8 (H) 12/15/2017   HGBA1C 5.6 10/20/2017      Assessment & Plan:   1. Type 2 diabetes mellitus with stage 5 chronic Little disease- status post fistula placement for hemodialysis. - She is not started on hemodialysis. Patient is advised to stick to a routine mealtimes to eat 3 meals  a day and avoid unnecessary snacks ( to snack only to correct hypoglycemia). - Her recent labs show A1c of 5.3%, however she has severe anemia with hemoglobin of 7.5 which might have underestimated her A1c. -   Her blood glucose readings are  consistent with tight fasting blood glucose readings, better postprandial readings on readjusted Humalog dose.    - She has history of iron deficiency anemia, Getting iron infusion at [facility].  Patient is advised to eliminate simple carbs  from their diet including cakes desserts ice cream soda (  diet and regular) , sweet tea , Candies,  chips and cookies, artificial sweeteners,   and "sugar-free" products .  This will help patient to have stable blood glucose profile and potentially lose weight. Patient is given detailed personalized glucose monitoring and insulin dosing instructions. Patient is instructed to call back with extremes of blood glucose less than 70 or greater than 300. Patient to bring meter and  blood glucose logs during their next visit.  I will lower Levemir to 8 units and shifted to morning with breakfast, readjust her Humalog to start 5 units when her pre-meal blood glucose is 150 mg/dL or above.  She is advised to continue strict monitoring of blood glucose 4 times a day-before meals and at bedtime.   -She is advised to call clinic if blood glucose readings are less than 70 or greater than 3003.  2. Essential hypertension, benign - Her blood pressure is Controlled to target.  I have advised her to continue current medications including metoprolol 50 mg p.o. twice daily.    3. Hyperlipemia No recent fasting lipid panel.  I have advised her to continue atorvastatatin.  She will have fasting lipid panel before next visit.  4. Morbid obesity Carbs advise given. See above #1.  5. vitamin D deficiency.  -Continue current Vitamin D supplement.  6) Chronic Care/Health Maintenance:  -Patient is on  Statin medications and encouraged to continue to follow up with Ophthalmology, nephrology for end-stage renal disease status post fistula placement, Podiatrist at least yearly or according to recommendations, and advised to   stay away from smoking. I have recommended yearly flu  vaccine and pneumonia vaccination at least every 5 years; and  sleep for at least 7 hours a day. - She is advised to avoid Vitamin B 12 supplement.  - Time spent with the  patient: 25 min, of which >50% was spent in reviewing her blood glucose logs , discussing her hypo- and hyper-glycemic episodes, reviewing her current and  previous labs and insulin doses and developing a plan to avoid hypo- and hyper-glycemia. Please refer to Patient Instructions for Blood Glucose Monitoring and Insulin/Medications Dosing Guide"  in media tab for additional information. [name] participated in the discussions, expressed understanding, and voiced agreement with the above plans.  All questions were answered to her satisfaction. she is encouraged to contact clinic should she have any questions or concerns prior to her return visit.  Follow up plan: Return in about 3 months (around 05/11/2018) for follow up with pre-visit labs, meter, and logs.  Glade Lloyd, MD Phone: [phone]  Fax: [phone]  -  This note was partially dictated with voice recognition software. Similar sounding words can be transcribed inadequately or may not  be corrected upon review.  02/08/2018, 12:05 PM

## 2018-02-09 DIAGNOSIS — E1129 Type 2 diabetes mellitus with other diabetic kidney complication: Secondary | ICD-10-CM | POA: Diagnosis not present

## 2018-02-09 DIAGNOSIS — D631 Anemia in chronic kidney disease: Secondary | ICD-10-CM | POA: Diagnosis not present

## 2018-02-09 DIAGNOSIS — N186 End stage renal disease: Secondary | ICD-10-CM | POA: Diagnosis not present

## 2018-02-09 DIAGNOSIS — Z23 Encounter for immunization: Secondary | ICD-10-CM | POA: Diagnosis not present

## 2018-02-09 DIAGNOSIS — E876 Hypokalemia: Secondary | ICD-10-CM | POA: Diagnosis not present

## 2018-02-09 DIAGNOSIS — N2581 Secondary hyperparathyroidism of renal origin: Secondary | ICD-10-CM | POA: Diagnosis not present

## 2018-02-09 DIAGNOSIS — D509 Iron deficiency anemia, unspecified: Secondary | ICD-10-CM | POA: Diagnosis not present

## 2018-02-09 DIAGNOSIS — D689 Coagulation defect, unspecified: Secondary | ICD-10-CM | POA: Diagnosis not present

## 2018-02-10 ENCOUNTER — Ambulatory Visit (INDEPENDENT_AMBULATORY_CARE_PROVIDER_SITE_OTHER): Payer: Self-pay | Admitting: Physician Assistant

## 2018-02-10 ENCOUNTER — Other Ambulatory Visit: Payer: Self-pay

## 2018-02-10 ENCOUNTER — Encounter: Payer: Self-pay | Admitting: Physician Assistant

## 2018-02-10 ENCOUNTER — Ambulatory Visit: Payer: Self-pay | Admitting: *Deleted

## 2018-02-10 VITALS — BP 131/56 | HR 73 | Temp 98.2°F | Resp 18 | Ht 64.0 in | Wt 179.0 lb

## 2018-02-10 DIAGNOSIS — N186 End stage renal disease: Secondary | ICD-10-CM

## 2018-02-10 DIAGNOSIS — L97219 Non-pressure chronic ulcer of right calf with unspecified severity: Secondary | ICD-10-CM | POA: Insufficient documentation

## 2018-02-10 DIAGNOSIS — Z992 Dependence on renal dialysis: Secondary | ICD-10-CM

## 2018-02-10 NOTE — Progress Notes (Signed)
    Postoperative Access Visit   History of Present Illness   [name] is a 72 y.o. year old female who presents for postoperative follow-up for: left second stage basilic vein transposition by Dr. Oneida Alar  (Date: 01/12/18).  The patient's wounds are still healing healed.  She states drainage from incision near her AC fossa has resolved. Tthe patient denies steal symptoms.  The patient is minimally ambulatory with the use of a walker.  ADLs are performed mostly by her husband.  She is dialyzing on a Tuesday Thursday Saturday schedule via right IJ TDC.  She is also being followed for large necrotic ulceration of right calf.  Aortogram with runoff was performed by Dr. Oneida Alar in March of this year.  He concluded from this procedure that the ulceration is not vascular in nature arterial or venous.  She was offered amputation however was not ready at the time.  Today she denies persistent severe pain.  Patient is still not consenting for leg amputation at this time.  She continues to receive home health wound care.   Physical Examination   Vitals:   02/10/18 1352  BP: (!) 131/56  Pulse: 73  Resp: 18  Temp: 98.2 F (36.8 C)  TempSrc: Oral  SpO2: 99%  Weight: 179 lb (81.2 kg)  Height: 5\' 4"  (1.626 m)   Body mass index is 30.73 kg/m.  left arm Incisions of left arm are healing well with no drainage or other signs of infection; palpable thrill near [facility] fossa incision however unable to palpate thrill throughout the length of the fistula due to edema; audible bruit throughout length of fistula; palpable left radial pulse   Large necrotic right calf ulceration with minimal thin yellow drainage; large eschar overlying    Medical Decision Making   [name] is a 72 y.o. year old female who presents s/p left second stage basilic vein transposition   Patent transposed basilic vein fistula of left upper arm  This fistula is not ready for use as incisions are still healing  and edema is still present  Recheck left arm AV fistula in 1 month to assess readiness for dialysis; continue HD from right IJ [facility] for now  Patient was again offered amputation today due to extensive right calf wound; patient and husband are not yet ready to consent; we discussed delaying amputation to long can lead to systemic infection and can even be fatal; patient and husband expressed her understanding   Dagoberto Ligas, PA-C [facility] [place] Office: [phone]

## 2018-02-11 DIAGNOSIS — E1129 Type 2 diabetes mellitus with other diabetic kidney complication: Secondary | ICD-10-CM | POA: Diagnosis not present

## 2018-02-11 DIAGNOSIS — Z23 Encounter for immunization: Secondary | ICD-10-CM | POA: Diagnosis not present

## 2018-02-11 DIAGNOSIS — N2581 Secondary hyperparathyroidism of renal origin: Secondary | ICD-10-CM | POA: Diagnosis not present

## 2018-02-11 DIAGNOSIS — N186 End stage renal disease: Secondary | ICD-10-CM | POA: Diagnosis not present

## 2018-02-11 DIAGNOSIS — E876 Hypokalemia: Secondary | ICD-10-CM | POA: Diagnosis not present

## 2018-02-11 DIAGNOSIS — D509 Iron deficiency anemia, unspecified: Secondary | ICD-10-CM | POA: Diagnosis not present

## 2018-02-11 DIAGNOSIS — D631 Anemia in chronic kidney disease: Secondary | ICD-10-CM | POA: Diagnosis not present

## 2018-02-11 DIAGNOSIS — D689 Coagulation defect, unspecified: Secondary | ICD-10-CM | POA: Diagnosis not present

## 2018-02-12 DIAGNOSIS — J449 Chronic obstructive pulmonary disease, unspecified: Secondary | ICD-10-CM | POA: Diagnosis not present

## 2018-02-12 DIAGNOSIS — H409 Unspecified glaucoma: Secondary | ICD-10-CM | POA: Diagnosis not present

## 2018-02-12 DIAGNOSIS — E785 Hyperlipidemia, unspecified: Secondary | ICD-10-CM | POA: Diagnosis not present

## 2018-02-12 DIAGNOSIS — M109 Gout, unspecified: Secondary | ICD-10-CM | POA: Diagnosis not present

## 2018-02-12 DIAGNOSIS — I4891 Unspecified atrial fibrillation: Secondary | ICD-10-CM | POA: Diagnosis not present

## 2018-02-12 DIAGNOSIS — M150 Primary generalized (osteo)arthritis: Secondary | ICD-10-CM | POA: Diagnosis not present

## 2018-02-12 DIAGNOSIS — L03115 Cellulitis of right lower limb: Secondary | ICD-10-CM | POA: Diagnosis not present

## 2018-02-12 DIAGNOSIS — M329 Systemic lupus erythematosus, unspecified: Secondary | ICD-10-CM | POA: Diagnosis not present

## 2018-02-12 DIAGNOSIS — N186 End stage renal disease: Secondary | ICD-10-CM | POA: Diagnosis not present

## 2018-02-12 DIAGNOSIS — Z794 Long term (current) use of insulin: Secondary | ICD-10-CM | POA: Diagnosis not present

## 2018-02-12 DIAGNOSIS — I872 Venous insufficiency (chronic) (peripheral): Secondary | ICD-10-CM | POA: Diagnosis not present

## 2018-02-12 DIAGNOSIS — Z87891 Personal history of nicotine dependence: Secondary | ICD-10-CM | POA: Diagnosis not present

## 2018-02-12 DIAGNOSIS — E1122 Type 2 diabetes mellitus with diabetic chronic kidney disease: Secondary | ICD-10-CM | POA: Diagnosis not present

## 2018-02-12 DIAGNOSIS — Z992 Dependence on renal dialysis: Secondary | ICD-10-CM | POA: Diagnosis not present

## 2018-02-13 DIAGNOSIS — D689 Coagulation defect, unspecified: Secondary | ICD-10-CM | POA: Diagnosis not present

## 2018-02-13 DIAGNOSIS — N2581 Secondary hyperparathyroidism of renal origin: Secondary | ICD-10-CM | POA: Diagnosis not present

## 2018-02-13 DIAGNOSIS — N186 End stage renal disease: Secondary | ICD-10-CM | POA: Diagnosis not present

## 2018-02-13 DIAGNOSIS — D631 Anemia in chronic kidney disease: Secondary | ICD-10-CM | POA: Diagnosis not present

## 2018-02-13 DIAGNOSIS — Z23 Encounter for immunization: Secondary | ICD-10-CM | POA: Diagnosis not present

## 2018-02-13 DIAGNOSIS — E876 Hypokalemia: Secondary | ICD-10-CM | POA: Diagnosis not present

## 2018-02-13 DIAGNOSIS — D509 Iron deficiency anemia, unspecified: Secondary | ICD-10-CM | POA: Diagnosis not present

## 2018-02-13 DIAGNOSIS — E1129 Type 2 diabetes mellitus with other diabetic kidney complication: Secondary | ICD-10-CM | POA: Diagnosis not present

## 2018-02-15 DIAGNOSIS — M150 Primary generalized (osteo)arthritis: Secondary | ICD-10-CM | POA: Diagnosis not present

## 2018-02-15 DIAGNOSIS — N186 End stage renal disease: Secondary | ICD-10-CM | POA: Diagnosis not present

## 2018-02-15 DIAGNOSIS — Z992 Dependence on renal dialysis: Secondary | ICD-10-CM | POA: Diagnosis not present

## 2018-02-15 DIAGNOSIS — J449 Chronic obstructive pulmonary disease, unspecified: Secondary | ICD-10-CM | POA: Diagnosis not present

## 2018-02-15 DIAGNOSIS — I4891 Unspecified atrial fibrillation: Secondary | ICD-10-CM | POA: Diagnosis not present

## 2018-02-15 DIAGNOSIS — Z87891 Personal history of nicotine dependence: Secondary | ICD-10-CM | POA: Diagnosis not present

## 2018-02-15 DIAGNOSIS — E785 Hyperlipidemia, unspecified: Secondary | ICD-10-CM | POA: Diagnosis not present

## 2018-02-15 DIAGNOSIS — M329 Systemic lupus erythematosus, unspecified: Secondary | ICD-10-CM | POA: Diagnosis not present

## 2018-02-15 DIAGNOSIS — L03115 Cellulitis of right lower limb: Secondary | ICD-10-CM | POA: Diagnosis not present

## 2018-02-15 DIAGNOSIS — I872 Venous insufficiency (chronic) (peripheral): Secondary | ICD-10-CM | POA: Diagnosis not present

## 2018-02-15 DIAGNOSIS — E1122 Type 2 diabetes mellitus with diabetic chronic kidney disease: Secondary | ICD-10-CM | POA: Diagnosis not present

## 2018-02-15 DIAGNOSIS — Z794 Long term (current) use of insulin: Secondary | ICD-10-CM | POA: Diagnosis not present

## 2018-02-15 DIAGNOSIS — H409 Unspecified glaucoma: Secondary | ICD-10-CM | POA: Diagnosis not present

## 2018-02-15 DIAGNOSIS — M109 Gout, unspecified: Secondary | ICD-10-CM | POA: Diagnosis not present

## 2018-02-16 DIAGNOSIS — E876 Hypokalemia: Secondary | ICD-10-CM | POA: Diagnosis not present

## 2018-02-16 DIAGNOSIS — N2581 Secondary hyperparathyroidism of renal origin: Secondary | ICD-10-CM | POA: Diagnosis not present

## 2018-02-16 DIAGNOSIS — N186 End stage renal disease: Secondary | ICD-10-CM | POA: Diagnosis not present

## 2018-02-16 DIAGNOSIS — D631 Anemia in chronic kidney disease: Secondary | ICD-10-CM | POA: Diagnosis not present

## 2018-02-16 DIAGNOSIS — Z23 Encounter for immunization: Secondary | ICD-10-CM | POA: Diagnosis not present

## 2018-02-16 DIAGNOSIS — E1129 Type 2 diabetes mellitus with other diabetic kidney complication: Secondary | ICD-10-CM | POA: Diagnosis not present

## 2018-02-16 DIAGNOSIS — D689 Coagulation defect, unspecified: Secondary | ICD-10-CM | POA: Diagnosis not present

## 2018-02-16 DIAGNOSIS — D509 Iron deficiency anemia, unspecified: Secondary | ICD-10-CM | POA: Diagnosis not present

## 2018-02-17 DIAGNOSIS — H409 Unspecified glaucoma: Secondary | ICD-10-CM | POA: Diagnosis not present

## 2018-02-17 DIAGNOSIS — Z992 Dependence on renal dialysis: Secondary | ICD-10-CM | POA: Diagnosis not present

## 2018-02-17 DIAGNOSIS — I4891 Unspecified atrial fibrillation: Secondary | ICD-10-CM | POA: Diagnosis not present

## 2018-02-17 DIAGNOSIS — M150 Primary generalized (osteo)arthritis: Secondary | ICD-10-CM | POA: Diagnosis not present

## 2018-02-17 DIAGNOSIS — D179 Benign lipomatous neoplasm, unspecified: Secondary | ICD-10-CM | POA: Diagnosis not present

## 2018-02-17 DIAGNOSIS — L03115 Cellulitis of right lower limb: Secondary | ICD-10-CM | POA: Diagnosis not present

## 2018-02-17 DIAGNOSIS — Z87891 Personal history of nicotine dependence: Secondary | ICD-10-CM | POA: Diagnosis not present

## 2018-02-17 DIAGNOSIS — N399 Disorder of urinary system, unspecified: Secondary | ICD-10-CM | POA: Diagnosis not present

## 2018-02-17 DIAGNOSIS — I872 Venous insufficiency (chronic) (peripheral): Secondary | ICD-10-CM | POA: Diagnosis not present

## 2018-02-17 DIAGNOSIS — M25551 Pain in right hip: Secondary | ICD-10-CM | POA: Diagnosis not present

## 2018-02-17 DIAGNOSIS — M109 Gout, unspecified: Secondary | ICD-10-CM | POA: Diagnosis not present

## 2018-02-17 DIAGNOSIS — E1122 Type 2 diabetes mellitus with diabetic chronic kidney disease: Secondary | ICD-10-CM | POA: Diagnosis not present

## 2018-02-17 DIAGNOSIS — Z794 Long term (current) use of insulin: Secondary | ICD-10-CM | POA: Diagnosis not present

## 2018-02-17 DIAGNOSIS — E785 Hyperlipidemia, unspecified: Secondary | ICD-10-CM | POA: Diagnosis not present

## 2018-02-17 DIAGNOSIS — J449 Chronic obstructive pulmonary disease, unspecified: Secondary | ICD-10-CM | POA: Diagnosis not present

## 2018-02-17 DIAGNOSIS — M329 Systemic lupus erythematosus, unspecified: Secondary | ICD-10-CM | POA: Diagnosis not present

## 2018-02-17 DIAGNOSIS — N186 End stage renal disease: Secondary | ICD-10-CM | POA: Diagnosis not present

## 2018-02-18 DIAGNOSIS — E1129 Type 2 diabetes mellitus with other diabetic kidney complication: Secondary | ICD-10-CM | POA: Diagnosis not present

## 2018-02-18 DIAGNOSIS — D631 Anemia in chronic kidney disease: Secondary | ICD-10-CM | POA: Diagnosis not present

## 2018-02-18 DIAGNOSIS — D509 Iron deficiency anemia, unspecified: Secondary | ICD-10-CM | POA: Diagnosis not present

## 2018-02-18 DIAGNOSIS — Z23 Encounter for immunization: Secondary | ICD-10-CM | POA: Diagnosis not present

## 2018-02-18 DIAGNOSIS — N186 End stage renal disease: Secondary | ICD-10-CM | POA: Diagnosis not present

## 2018-02-18 DIAGNOSIS — E876 Hypokalemia: Secondary | ICD-10-CM | POA: Diagnosis not present

## 2018-02-18 DIAGNOSIS — N2581 Secondary hyperparathyroidism of renal origin: Secondary | ICD-10-CM | POA: Diagnosis not present

## 2018-02-18 DIAGNOSIS — D689 Coagulation defect, unspecified: Secondary | ICD-10-CM | POA: Diagnosis not present

## 2018-02-19 DIAGNOSIS — E1140 Type 2 diabetes mellitus with diabetic neuropathy, unspecified: Secondary | ICD-10-CM | POA: Diagnosis not present

## 2018-02-19 DIAGNOSIS — L89892 Pressure ulcer of other site, stage 2: Secondary | ICD-10-CM | POA: Diagnosis not present

## 2018-02-19 DIAGNOSIS — E1151 Type 2 diabetes mellitus with diabetic peripheral angiopathy without gangrene: Secondary | ICD-10-CM | POA: Diagnosis not present

## 2018-02-20 DIAGNOSIS — D509 Iron deficiency anemia, unspecified: Secondary | ICD-10-CM | POA: Diagnosis not present

## 2018-02-20 DIAGNOSIS — D631 Anemia in chronic kidney disease: Secondary | ICD-10-CM | POA: Diagnosis not present

## 2018-02-20 DIAGNOSIS — E876 Hypokalemia: Secondary | ICD-10-CM | POA: Diagnosis not present

## 2018-02-20 DIAGNOSIS — N2581 Secondary hyperparathyroidism of renal origin: Secondary | ICD-10-CM | POA: Diagnosis not present

## 2018-02-20 DIAGNOSIS — Z23 Encounter for immunization: Secondary | ICD-10-CM | POA: Diagnosis not present

## 2018-02-20 DIAGNOSIS — N186 End stage renal disease: Secondary | ICD-10-CM | POA: Diagnosis not present

## 2018-02-20 DIAGNOSIS — E1129 Type 2 diabetes mellitus with other diabetic kidney complication: Secondary | ICD-10-CM | POA: Diagnosis not present

## 2018-02-20 DIAGNOSIS — D689 Coagulation defect, unspecified: Secondary | ICD-10-CM | POA: Diagnosis not present

## 2018-02-22 DIAGNOSIS — J449 Chronic obstructive pulmonary disease, unspecified: Secondary | ICD-10-CM | POA: Diagnosis not present

## 2018-02-22 DIAGNOSIS — I872 Venous insufficiency (chronic) (peripheral): Secondary | ICD-10-CM | POA: Diagnosis not present

## 2018-02-22 DIAGNOSIS — E785 Hyperlipidemia, unspecified: Secondary | ICD-10-CM | POA: Diagnosis not present

## 2018-02-22 DIAGNOSIS — E1122 Type 2 diabetes mellitus with diabetic chronic kidney disease: Secondary | ICD-10-CM | POA: Diagnosis not present

## 2018-02-22 DIAGNOSIS — Z794 Long term (current) use of insulin: Secondary | ICD-10-CM | POA: Diagnosis not present

## 2018-02-22 DIAGNOSIS — H409 Unspecified glaucoma: Secondary | ICD-10-CM | POA: Diagnosis not present

## 2018-02-22 DIAGNOSIS — M109 Gout, unspecified: Secondary | ICD-10-CM | POA: Diagnosis not present

## 2018-02-22 DIAGNOSIS — L03115 Cellulitis of right lower limb: Secondary | ICD-10-CM | POA: Diagnosis not present

## 2018-02-22 DIAGNOSIS — N186 End stage renal disease: Secondary | ICD-10-CM | POA: Diagnosis not present

## 2018-02-22 DIAGNOSIS — I4891 Unspecified atrial fibrillation: Secondary | ICD-10-CM | POA: Diagnosis not present

## 2018-02-22 DIAGNOSIS — Z87891 Personal history of nicotine dependence: Secondary | ICD-10-CM | POA: Diagnosis not present

## 2018-02-22 DIAGNOSIS — M329 Systemic lupus erythematosus, unspecified: Secondary | ICD-10-CM | POA: Diagnosis not present

## 2018-02-22 DIAGNOSIS — M150 Primary generalized (osteo)arthritis: Secondary | ICD-10-CM | POA: Diagnosis not present

## 2018-02-22 DIAGNOSIS — Z992 Dependence on renal dialysis: Secondary | ICD-10-CM | POA: Diagnosis not present

## 2018-02-23 DIAGNOSIS — D631 Anemia in chronic kidney disease: Secondary | ICD-10-CM | POA: Diagnosis not present

## 2018-02-23 DIAGNOSIS — Z23 Encounter for immunization: Secondary | ICD-10-CM | POA: Diagnosis not present

## 2018-02-23 DIAGNOSIS — N186 End stage renal disease: Secondary | ICD-10-CM | POA: Diagnosis not present

## 2018-02-23 DIAGNOSIS — D509 Iron deficiency anemia, unspecified: Secondary | ICD-10-CM | POA: Diagnosis not present

## 2018-02-23 DIAGNOSIS — E876 Hypokalemia: Secondary | ICD-10-CM | POA: Diagnosis not present

## 2018-02-23 DIAGNOSIS — D689 Coagulation defect, unspecified: Secondary | ICD-10-CM | POA: Diagnosis not present

## 2018-02-23 DIAGNOSIS — E1129 Type 2 diabetes mellitus with other diabetic kidney complication: Secondary | ICD-10-CM | POA: Diagnosis not present

## 2018-02-23 DIAGNOSIS — N2581 Secondary hyperparathyroidism of renal origin: Secondary | ICD-10-CM | POA: Diagnosis not present

## 2018-02-24 DIAGNOSIS — I4891 Unspecified atrial fibrillation: Secondary | ICD-10-CM | POA: Diagnosis not present

## 2018-02-24 DIAGNOSIS — L03115 Cellulitis of right lower limb: Secondary | ICD-10-CM | POA: Diagnosis not present

## 2018-02-24 DIAGNOSIS — M150 Primary generalized (osteo)arthritis: Secondary | ICD-10-CM | POA: Diagnosis not present

## 2018-02-24 DIAGNOSIS — J449 Chronic obstructive pulmonary disease, unspecified: Secondary | ICD-10-CM | POA: Diagnosis not present

## 2018-02-24 DIAGNOSIS — Z87891 Personal history of nicotine dependence: Secondary | ICD-10-CM | POA: Diagnosis not present

## 2018-02-24 DIAGNOSIS — H409 Unspecified glaucoma: Secondary | ICD-10-CM | POA: Diagnosis not present

## 2018-02-24 DIAGNOSIS — I872 Venous insufficiency (chronic) (peripheral): Secondary | ICD-10-CM | POA: Diagnosis not present

## 2018-02-24 DIAGNOSIS — M109 Gout, unspecified: Secondary | ICD-10-CM | POA: Diagnosis not present

## 2018-02-24 DIAGNOSIS — N186 End stage renal disease: Secondary | ICD-10-CM | POA: Diagnosis not present

## 2018-02-24 DIAGNOSIS — Z794 Long term (current) use of insulin: Secondary | ICD-10-CM | POA: Diagnosis not present

## 2018-02-24 DIAGNOSIS — E785 Hyperlipidemia, unspecified: Secondary | ICD-10-CM | POA: Diagnosis not present

## 2018-02-24 DIAGNOSIS — M329 Systemic lupus erythematosus, unspecified: Secondary | ICD-10-CM | POA: Diagnosis not present

## 2018-02-24 DIAGNOSIS — Z992 Dependence on renal dialysis: Secondary | ICD-10-CM | POA: Diagnosis not present

## 2018-02-24 DIAGNOSIS — E1122 Type 2 diabetes mellitus with diabetic chronic kidney disease: Secondary | ICD-10-CM | POA: Diagnosis not present

## 2018-02-25 DIAGNOSIS — N186 End stage renal disease: Secondary | ICD-10-CM | POA: Diagnosis not present

## 2018-02-25 DIAGNOSIS — D689 Coagulation defect, unspecified: Secondary | ICD-10-CM | POA: Diagnosis not present

## 2018-02-25 DIAGNOSIS — E876 Hypokalemia: Secondary | ICD-10-CM | POA: Diagnosis not present

## 2018-02-25 DIAGNOSIS — D631 Anemia in chronic kidney disease: Secondary | ICD-10-CM | POA: Diagnosis not present

## 2018-02-25 DIAGNOSIS — D509 Iron deficiency anemia, unspecified: Secondary | ICD-10-CM | POA: Diagnosis not present

## 2018-02-25 DIAGNOSIS — N2581 Secondary hyperparathyroidism of renal origin: Secondary | ICD-10-CM | POA: Diagnosis not present

## 2018-02-25 DIAGNOSIS — Z23 Encounter for immunization: Secondary | ICD-10-CM | POA: Diagnosis not present

## 2018-02-25 DIAGNOSIS — E1129 Type 2 diabetes mellitus with other diabetic kidney complication: Secondary | ICD-10-CM | POA: Diagnosis not present

## 2018-02-26 ENCOUNTER — Telehealth: Payer: Self-pay | Admitting: Vascular Surgery

## 2018-02-26 DIAGNOSIS — Z87891 Personal history of nicotine dependence: Secondary | ICD-10-CM | POA: Diagnosis not present

## 2018-02-26 DIAGNOSIS — M150 Primary generalized (osteo)arthritis: Secondary | ICD-10-CM | POA: Diagnosis not present

## 2018-02-26 DIAGNOSIS — I4891 Unspecified atrial fibrillation: Secondary | ICD-10-CM | POA: Diagnosis not present

## 2018-02-26 DIAGNOSIS — Z794 Long term (current) use of insulin: Secondary | ICD-10-CM | POA: Diagnosis not present

## 2018-02-26 DIAGNOSIS — H409 Unspecified glaucoma: Secondary | ICD-10-CM | POA: Diagnosis not present

## 2018-02-26 DIAGNOSIS — M109 Gout, unspecified: Secondary | ICD-10-CM | POA: Diagnosis not present

## 2018-02-26 DIAGNOSIS — Z992 Dependence on renal dialysis: Secondary | ICD-10-CM | POA: Diagnosis not present

## 2018-02-26 DIAGNOSIS — L03115 Cellulitis of right lower limb: Secondary | ICD-10-CM | POA: Diagnosis not present

## 2018-02-26 DIAGNOSIS — E1122 Type 2 diabetes mellitus with diabetic chronic kidney disease: Secondary | ICD-10-CM | POA: Diagnosis not present

## 2018-02-26 DIAGNOSIS — J449 Chronic obstructive pulmonary disease, unspecified: Secondary | ICD-10-CM | POA: Diagnosis not present

## 2018-02-26 DIAGNOSIS — E1129 Type 2 diabetes mellitus with other diabetic kidney complication: Secondary | ICD-10-CM | POA: Diagnosis not present

## 2018-02-26 DIAGNOSIS — E785 Hyperlipidemia, unspecified: Secondary | ICD-10-CM | POA: Diagnosis not present

## 2018-02-26 DIAGNOSIS — M329 Systemic lupus erythematosus, unspecified: Secondary | ICD-10-CM | POA: Diagnosis not present

## 2018-02-26 DIAGNOSIS — I872 Venous insufficiency (chronic) (peripheral): Secondary | ICD-10-CM | POA: Diagnosis not present

## 2018-02-26 DIAGNOSIS — N186 End stage renal disease: Secondary | ICD-10-CM | POA: Diagnosis not present

## 2018-02-26 NOTE — Telephone Encounter (Signed)
Tobi Bastos from [place] called wanting Dr. Oneida Alar to debride this patient's wound and give new [place] orders.  Per Dr. Oneida Alar, refer patient to either a general surgeon, or a wound care center.  Albin Felling has been made aware.  Pt has follow up appt to see Dr. Scot Dock on 03/10/2018.   Thurston Hole., LPN

## 2018-02-26 NOTE — Telephone Encounter (Signed)
Starlena called back to say that a general surgeon in [place] will debride patient's R LE wound.

## 2018-02-27 DIAGNOSIS — D509 Iron deficiency anemia, unspecified: Secondary | ICD-10-CM | POA: Diagnosis not present

## 2018-02-27 DIAGNOSIS — D689 Coagulation defect, unspecified: Secondary | ICD-10-CM | POA: Diagnosis not present

## 2018-02-27 DIAGNOSIS — E876 Hypokalemia: Secondary | ICD-10-CM | POA: Diagnosis not present

## 2018-02-27 DIAGNOSIS — E1129 Type 2 diabetes mellitus with other diabetic kidney complication: Secondary | ICD-10-CM | POA: Diagnosis not present

## 2018-02-27 DIAGNOSIS — N2581 Secondary hyperparathyroidism of renal origin: Secondary | ICD-10-CM | POA: Diagnosis not present

## 2018-02-27 DIAGNOSIS — D631 Anemia in chronic kidney disease: Secondary | ICD-10-CM | POA: Diagnosis not present

## 2018-02-27 DIAGNOSIS — N186 End stage renal disease: Secondary | ICD-10-CM | POA: Diagnosis not present

## 2018-03-01 ENCOUNTER — Observation Stay (HOSPITAL_COMMUNITY)
Admission: EM | Admit: 2018-03-01 | Discharge: 2018-03-03 | Disposition: A | Discharge status: Home / Self Care | Payer: Medicare Other | Attending: Family Medicine | Admitting: Family Medicine

## 2018-03-01 ENCOUNTER — Emergency Department (HOSPITAL_COMMUNITY): Payer: Medicare Other

## 2018-03-01 ENCOUNTER — Telehealth (HOSPITAL_COMMUNITY): Payer: Self-pay | Admitting: Physical Therapy

## 2018-03-01 ENCOUNTER — Encounter (HOSPITAL_COMMUNITY): Payer: Self-pay | Admitting: Emergency Medicine

## 2018-03-01 DIAGNOSIS — Z79899 Other long term (current) drug therapy: Secondary | ICD-10-CM | POA: Diagnosis not present

## 2018-03-01 DIAGNOSIS — I10 Essential (primary) hypertension: Secondary | ICD-10-CM | POA: Diagnosis present

## 2018-03-01 DIAGNOSIS — E1122 Type 2 diabetes mellitus with diabetic chronic kidney disease: Secondary | ICD-10-CM | POA: Diagnosis not present

## 2018-03-01 DIAGNOSIS — Z794 Long term (current) use of insulin: Secondary | ICD-10-CM | POA: Insufficient documentation

## 2018-03-01 DIAGNOSIS — J449 Chronic obstructive pulmonary disease, unspecified: Secondary | ICD-10-CM | POA: Diagnosis not present

## 2018-03-01 DIAGNOSIS — Z5189 Encounter for other specified aftercare: Secondary | ICD-10-CM

## 2018-03-01 DIAGNOSIS — I132 Hypertensive heart and chronic kidney disease with heart failure and with stage 5 chronic kidney disease, or end stage renal disease: Secondary | ICD-10-CM | POA: Diagnosis not present

## 2018-03-01 DIAGNOSIS — L03115 Cellulitis of right lower limb: Principal | ICD-10-CM | POA: Insufficient documentation

## 2018-03-01 DIAGNOSIS — M150 Primary generalized (osteo)arthritis: Secondary | ICD-10-CM | POA: Diagnosis not present

## 2018-03-01 DIAGNOSIS — Z7901 Long term (current) use of anticoagulants: Secondary | ICD-10-CM | POA: Insufficient documentation

## 2018-03-01 DIAGNOSIS — I4891 Unspecified atrial fibrillation: Secondary | ICD-10-CM | POA: Insufficient documentation

## 2018-03-01 DIAGNOSIS — S81801A Unspecified open wound, right lower leg, initial encounter: Secondary | ICD-10-CM | POA: Diagnosis present

## 2018-03-01 DIAGNOSIS — M329 Systemic lupus erythematosus, unspecified: Secondary | ICD-10-CM | POA: Diagnosis not present

## 2018-03-01 DIAGNOSIS — E785 Hyperlipidemia, unspecified: Secondary | ICD-10-CM | POA: Diagnosis not present

## 2018-03-01 DIAGNOSIS — N186 End stage renal disease: Secondary | ICD-10-CM | POA: Insufficient documentation

## 2018-03-01 DIAGNOSIS — I872 Venous insufficiency (chronic) (peripheral): Secondary | ICD-10-CM | POA: Diagnosis not present

## 2018-03-01 DIAGNOSIS — E440 Moderate protein-calorie malnutrition: Secondary | ICD-10-CM

## 2018-03-01 DIAGNOSIS — L97219 Non-pressure chronic ulcer of right calf with unspecified severity: Secondary | ICD-10-CM | POA: Diagnosis present

## 2018-03-01 DIAGNOSIS — Z87891 Personal history of nicotine dependence: Secondary | ICD-10-CM | POA: Insufficient documentation

## 2018-03-01 DIAGNOSIS — I5032 Chronic diastolic (congestive) heart failure: Secondary | ICD-10-CM | POA: Insufficient documentation

## 2018-03-01 DIAGNOSIS — E46 Unspecified protein-calorie malnutrition: Secondary | ICD-10-CM | POA: Diagnosis not present

## 2018-03-01 DIAGNOSIS — E1140 Type 2 diabetes mellitus with diabetic neuropathy, unspecified: Secondary | ICD-10-CM | POA: Diagnosis present

## 2018-03-01 DIAGNOSIS — Z86718 Personal history of other venous thrombosis and embolism: Secondary | ICD-10-CM | POA: Insufficient documentation

## 2018-03-01 DIAGNOSIS — M109 Gout, unspecified: Secondary | ICD-10-CM | POA: Diagnosis not present

## 2018-03-01 DIAGNOSIS — H409 Unspecified glaucoma: Secondary | ICD-10-CM | POA: Diagnosis not present

## 2018-03-01 DIAGNOSIS — S81801S Unspecified open wound, right lower leg, sequela: Secondary | ICD-10-CM

## 2018-03-01 DIAGNOSIS — Z992 Dependence on renal dialysis: Secondary | ICD-10-CM

## 2018-03-01 LAB — COMPREHENSIVE METABOLIC PANEL
ALK PHOS: 64 U/L (ref 38–126)
ALT: 14 U/L (ref 14–54)
ANION GAP: 14 (ref 5–15)
AST: 17 U/L (ref 15–41)
Albumin: 2.6 g/dL — ABNORMAL LOW (ref 3.5–5.0)
BUN: 80 mg/dL — ABNORMAL HIGH (ref 6–20)
CALCIUM: 9.6 mg/dL (ref 8.9–10.3)
CO2: 29 mmol/L (ref 22–32)
Chloride: 95 mmol/L — ABNORMAL LOW (ref 101–111)
Creatinine, Ser: 6.36 mg/dL — ABNORMAL HIGH (ref 0.44–1.00)
GFR calc non Af Amer: 6 mL/min — ABNORMAL LOW (ref >60)
GFR, EST AFRICAN AMERICAN: 7 mL/min — AB (ref >60)
Glucose, Bld: 184 mg/dL — ABNORMAL HIGH (ref 65–99)
Potassium: 4.3 mmol/L (ref 3.5–5.1)
SODIUM: 138 mmol/L (ref 135–145)
Total Bilirubin: 0.4 mg/dL (ref 0.3–1.2)
Total Protein: 7.1 g/dL (ref 6.5–8.1)

## 2018-03-01 LAB — CBC WITH DIFFERENTIAL/PLATELET
BASOS PCT: 0 %
Basophils Absolute: 0 10*3/uL (ref 0.0–0.1)
Eosinophils Absolute: 0.1 10*3/uL (ref 0.0–0.7)
Eosinophils Relative: 1 %
HEMATOCRIT: 36.4 % (ref 36.0–46.0)
HEMOGLOBIN: 10.7 g/dL — AB (ref 12.0–15.0)
Lymphocytes Relative: 8 %
Lymphs Abs: 0.8 10*3/uL (ref 0.7–4.0)
MCH: 27.2 pg (ref 26.0–34.0)
MCHC: 29.4 g/dL — ABNORMAL LOW (ref 30.0–36.0)
MCV: 92.6 fL (ref 78.0–100.0)
Monocytes Absolute: 0.6 10*3/uL (ref 0.1–1.0)
Monocytes Relative: 6 %
NEUTROS ABS: 9.6 10*3/uL — AB (ref 1.7–7.7)
NEUTROS PCT: 85 %
Platelets: 272 10*3/uL (ref 150–400)
RBC: 3.93 MIL/uL (ref 3.87–5.11)
RDW: 16.8 % — ABNORMAL HIGH (ref 11.5–15.5)
WBC: 11.1 10*3/uL — AB (ref 4.0–10.5)

## 2018-03-01 LAB — CBG MONITORING, ED: GLUCOSE-CAPILLARY: 123 mg/dL — AB (ref 65–99)

## 2018-03-01 LAB — LACTIC ACID, PLASMA: Lactic Acid, Venous: 1.3 mmol/L (ref 0.5–1.9)

## 2018-03-01 MED ORDER — ATORVASTATIN CALCIUM 20 MG PO TABS
20.0000 mg | ORAL_TABLET | Freq: Every evening | ORAL | Status: DC
  Administered 2018-03-01 – 2018-03-02 (×2): 20 mg via ORAL
  Filled 2018-03-01 (×2): qty 1

## 2018-03-01 MED ORDER — INSULIN ASPART 100 UNIT/ML ~~LOC~~ SOLN
0.0000 [IU] | Freq: Three times a day (TID) | SUBCUTANEOUS | Status: DC
  Administered 2018-03-02: 1 [IU] via SUBCUTANEOUS

## 2018-03-01 MED ORDER — ALBUTEROL SULFATE (2.5 MG/3ML) 0.083% IN NEBU: 3.0000 mL | INHALATION_SOLUTION | Freq: Four times a day (QID) | RESPIRATORY_TRACT | Status: DC | PRN

## 2018-03-01 MED ORDER — FEBUXOSTAT 40 MG PO TABS
40.0000 mg | ORAL_TABLET | Freq: Every day | ORAL | Status: DC
  Administered 2018-03-02 – 2018-03-03 (×2): 40 mg via ORAL
  Filled 2018-03-01 (×2): qty 1

## 2018-03-01 MED ORDER — ONDANSETRON HCL 4 MG/2ML IJ SOLN: 4.0000 mg | Freq: Four times a day (QID) | INTRAMUSCULAR | Status: DC | PRN

## 2018-03-01 MED ORDER — SODIUM CHLORIDE 0.9% FLUSH: 3.0000 mL | INTRAVENOUS | Status: DC | PRN

## 2018-03-01 MED ORDER — SODIUM CHLORIDE 0.9 % IV SOLN
250.0000 mL | INTRAVENOUS | Status: DC | PRN
  Administered 2018-03-01: 250 mL via INTRAVENOUS

## 2018-03-01 MED ORDER — SILVER SULFADIAZINE 1 % EX CREA
1.0000 "application " | TOPICAL_CREAM | Freq: Every day | CUTANEOUS | Status: DC
  Administered 2018-03-02: 1 via TOPICAL
  Filled 2018-03-01: qty 85

## 2018-03-01 MED ORDER — METOPROLOL TARTRATE 50 MG PO TABS
50.0000 mg | ORAL_TABLET | ORAL | Status: DC
  Administered 2018-03-01 – 2018-03-03 (×2): 50 mg via ORAL
  Filled 2018-03-01 (×2): qty 1

## 2018-03-01 MED ORDER — TRAVOPROST (BAK FREE) 0.004 % OP SOLN
1.0000 [drp] | Freq: Every day | OPHTHALMIC | Status: DC
  Filled 2018-03-01: qty 2.5

## 2018-03-01 MED ORDER — HYDROXYCHLOROQUINE SULFATE 200 MG PO TABS
300.0000 mg | ORAL_TABLET | Freq: Every day | ORAL | Status: DC
  Administered 2018-03-02 – 2018-03-03 (×2): 300 mg via ORAL
  Filled 2018-03-01 (×2): qty 2

## 2018-03-01 MED ORDER — ENSURE ENLIVE PO LIQD: 237.0000 mL | Freq: Two times a day (BID) | ORAL | Status: DC

## 2018-03-01 MED ORDER — METOPROLOL TARTRATE 50 MG PO TABS: 50.0000 mg | ORAL_TABLET | ORAL | Status: DC

## 2018-03-01 MED ORDER — BRIMONIDINE TARTRATE-TIMOLOL 0.2-0.5 % OP SOLN
1.0000 [drp] | Freq: Every day | OPHTHALMIC | Status: DC
  Filled 2018-03-01: qty 5

## 2018-03-01 MED ORDER — OXYCODONE-ACETAMINOPHEN 5-325 MG PO TABS
1.0000 | ORAL_TABLET | Freq: Four times a day (QID) | ORAL | Status: DC | PRN
  Administered 2018-03-01 – 2018-03-03 (×7): 1 via ORAL
  Filled 2018-03-01 (×7): qty 1

## 2018-03-01 MED ORDER — PREDNISONE 10 MG PO TABS
5.0000 mg | ORAL_TABLET | Freq: Every day | ORAL | Status: DC
  Administered 2018-03-02 – 2018-03-03 (×2): 5 mg via ORAL
  Filled 2018-03-01 (×2): qty 1

## 2018-03-01 MED ORDER — APIXABAN 5 MG PO TABS
5.0000 mg | ORAL_TABLET | Freq: Two times a day (BID) | ORAL | Status: DC
  Administered 2018-03-01 – 2018-03-03 (×4): 5 mg via ORAL
  Filled 2018-03-01 (×4): qty 1

## 2018-03-01 MED ORDER — METOPROLOL TARTRATE 50 MG PO TABS
50.0000 mg | ORAL_TABLET | ORAL | Status: DC
  Filled 2018-03-01: qty 1

## 2018-03-01 MED ORDER — SILVER SULFADIAZINE 1 % EX CREA
TOPICAL_CREAM | CUTANEOUS | Status: AC
  Filled 2018-03-01: qty 50

## 2018-03-01 MED ORDER — FOLIC ACID 1 MG PO TABS
1.0000 mg | ORAL_TABLET | Freq: Every day | ORAL | Status: DC
  Administered 2018-03-02 – 2018-03-03 (×2): 1 mg via ORAL
  Filled 2018-03-01 (×2): qty 1

## 2018-03-01 MED ORDER — VANCOMYCIN HCL 500 MG IV SOLR
500.0000 mg | Freq: Once | INTRAVENOUS | Status: AC
  Administered 2018-03-01: 500 mg via INTRAVENOUS
  Filled 2018-03-01: qty 500

## 2018-03-01 MED ORDER — INSULIN DETEMIR 100 UNIT/ML ~~LOC~~ SOLN
8.0000 [IU] | Freq: Every morning | SUBCUTANEOUS | Status: DC
  Administered 2018-03-02 – 2018-03-03 (×2): 8 [IU] via SUBCUTANEOUS
  Filled 2018-03-01 (×3): qty 0.08

## 2018-03-01 MED ORDER — DICYCLOMINE HCL 10 MG PO CAPS
10.0000 mg | ORAL_CAPSULE | Freq: Three times a day (TID) | ORAL | Status: DC
  Administered 2018-03-01 – 2018-03-03 (×7): 10 mg via ORAL
  Filled 2018-03-01 (×7): qty 1

## 2018-03-01 MED ORDER — VANCOMYCIN HCL IN DEXTROSE 1-5 GM/200ML-% IV SOLN
1000.0000 mg | Freq: Once | INTRAVENOUS | Status: AC
  Administered 2018-03-01: 1000 mg via INTRAVENOUS
  Filled 2018-03-01: qty 200

## 2018-03-01 MED ORDER — ONDANSETRON HCL 4 MG PO TABS: 4.0000 mg | ORAL_TABLET | Freq: Four times a day (QID) | ORAL | Status: DC | PRN

## 2018-03-01 MED ORDER — SODIUM CHLORIDE 0.9% FLUSH
3.0000 mL | Freq: Two times a day (BID) | INTRAVENOUS | Status: DC
  Administered 2018-03-01 – 2018-03-03 (×4): 3 mL via INTRAVENOUS

## 2018-03-01 MED ORDER — VANCOMYCIN HCL 500 MG IV SOLR
INTRAVENOUS | Status: AC
  Filled 2018-03-01: qty 500

## 2018-03-01 NOTE — ED Provider Notes (Signed)
[facility] EMERGENCY DEPARTMENT Provider Note   CSN: [id number] Arrival date & time: 03/01/18  1301     History   Chief Complaint Chief Complaint  Patient presents with  . Wound Check    HPI [name] is a 72 y.o. female.  Patient has a wound that is not healing on her right lower leg.  She has been getting home health nursing care.  The wound has begun to smell bad.  Her physician felt like she needs to be admitted for antibiotics and treatment of this poor healing right lower leg  The history is provided by the patient. No language interpreter was used.  Wound Check  This is a recurrent problem. The current episode started more than 1 week ago. The problem occurs constantly. The problem has not changed since onset.Pertinent negatives include no chest pain, no abdominal pain and no headaches. Nothing aggravates the symptoms. Nothing relieves the symptoms. She has tried nothing for the symptoms. The treatment provided no relief.    Past Medical History:  Diagnosis Date  . Anemia   . Arthritis   . Asthma   . Atrial fibrillation ([place])   . Atrial flutter [facility])    TEE/DCCV December 2013 - on Xarelto [facility])  . Bone spur of toe    Right 5th toe  . Chronic diastolic CHF (congestive heart failure) (HCC)    a. EF 50-55% by echo in 2015  . CKD (chronic kidney disease) stage 4, GFR 15-29 ml/min (HCC)    a. s/p fistula placement but not on HD. Followed by Nephrology.   . Colon polyposis   . COPD (chronic obstructive pulmonary disease) ([place])   . DVT of upper extremity (deep vein thrombosis) ([place])    Right basilic vein, June 1916  . Essential hypertension, benign   . Fractures   . Glaucoma   . SLE (systemic lupus erythematosus) ([place])   . Type 2 diabetes mellitus ([place])   . UTI (lower urinary tract infection)     Patient Active Problem List   Diagnosis Date Noted  . Leg wound, right 03/01/2018  . Ulcer of right calf ([place]) 02/10/2018  . Chronic RLQ pain 01/11/2018  .  Ischemic ulcer of toes on both feet ([place])   . Cellulitis in diabetic foot ([place]) 12/14/2017  . Atrial fibrillation ([place]) 12/14/2017  . Glaucoma 12/14/2017  . ESRD (end stage renal disease) on dialysis ([place]) 11/16/2017  . COPD with acute exacerbation ([place]) 11/03/2017  . Community acquired pneumonia 11/02/2017  . Nausea and vomiting 11/02/2017  . Iron deficiency anemia 08/07/2017  . Heme positive stool 10/04/2015  . Mixed hyperlipidemia 06/29/2015  . Type 2 diabetes mellitus with stage 4 chronic kidney disease, with long-term current use of insulin ([place]) 06/29/2015  . Chronic kidney disease, stage V ([place]) 04/12/2014  . 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 Acute respiratory failure with hypoxia ([place]) 03/15/2014  . Chronic diastolic heart failure ([place]) 03/15/2014  . Anemia of chronic renal failure, stage 4 (severe) ([place]) 02/23/2014  . Folate deficiency 02/17/2014  . Aortic regurgitation 09/19/2012  . Class 2 severe obesity due to excess calories with serious comorbidity and body mass index (BMI) of 35.0 to 35.9 in adult ([place]) 09/14/2012  . Type 2 diabetes mellitus with diabetic neuropathy ([place]) 09/14/2012  . SLE (systemic lupus erythematosus) ([place]) 09/14/2012  . Essential hypertension, benign 09/14/2012  . COPD (chronic obstructive pulmonary disease) ([place]) 09/14/2012    Past Surgical History:  Procedure  Laterality Date  . ABDOMINAL AORTOGRAM W/LOWER EXTREMITY N/A 12/18/2017   Procedure: ABDOMINAL AORTOGRAM W/LOWER EXTREMITY;  Surgeon: Elam Dutch, MD;  Location: [place] [facility];  Service: Cardiovascular;  Laterality: N/A;  bilateral  . ABDOMINAL HYSTERECTOMY  1983  . ANKLE SURGERY  1993  . AV FISTULA PLACEMENT Left 03/27/2014   Procedure: ARTERIOVENOUS (AV) FISTULA CREATION;  Surgeon: Rosetta Posner, MD;  Location: [place];  Service: Vascular;  Laterality: Left;  . BACK SURGERY  1980  . BASCILIC VEIN TRANSPOSITION Left  01/13/2018   Procedure: LEFT BASILIC VEIN TRANSPOSITION SECOND STAGE;  Surgeon: Elam Dutch, MD;  Location: [place];  Service: Vascular;  Laterality: Left;  . CARDIOVASCULAR STRESS TEST  12/19/2009   no stress induced rhythm abnormalities, ekg negative for ischemia  . CARDIOVERSION  09/16/2012   Procedure: CARDIOVERSION;  Surgeon: Sanda Klein, MD;  Location: [facility];  Service: Cardiovascular;  Laterality: N/A;  . CATARACT EXTRACTION    . COLONOSCOPY  09/20/2012   Dr. [name]: multiple tubular adenomas   . COLONOSCOPY  2005   Dr. [name]. Polyps, path unknown   . COLONOSCOPY N/A 07/24/2016   Procedure: COLONOSCOPY;  Surgeon: [name], MD;  [facility];  Service: Endoscopy;  Laterality: N/A;  9:00 am  . ESOPHAGOGASTRODUODENOSCOPY  09/20/2012   Dr. [name]: duodenal erosion and possible resolving ulcer at the angularis   . EYE SURGERY    . IR FLUORO GUIDE CV LINE RIGHT  11/09/2017  . IR US GUIDE VASC ACCESS RIGHT  11/09/2017  . TEE WITHOUT CARDIOVERSION  09/16/2012   Procedure: TRANSESOPHAGEAL ECHOCARDIOGRAM (TEE);  Surgeon: Sanda Klein, MD;  Location: [facility] ENDOSCOPY;  Service: Cardiovascular;  Laterality: N/A;  . TRANSESOPHAGEAL ECHOCARDIOGRAM WITH CARDIOVERSION  09/16/2012   EF 60-65%, moderate LVH, moderate regurg of the aortic valve, LA moderately dilated     OB History   None      Home Medications    Prior to Admission medications   Medication Sig Start Date End Date Taking? Authorizing Provider  acetaminophen (TYLENOL) 500 MG tablet Take 1,000 mg by mouth every 6 (six) hours as needed. For pain   Yes [provider]  albuterol (ACCUNEB) 0.63 MG/3ML nebulizer solution Take 1 ampule by nebulization every 6 (six) hours as needed for wheezing or shortness of breath.    Yes [provider]  albuterol (PROVENTIL HFA;VENTOLIN HFA) 108 (90 BASE) MCG/ACT inhaler Inhale 2 puffs into the lungs every 6 (six) hours as needed. For shortness of  breath   Yes [provider]  Amino Acids-Protein Hydrolys (FEEDING SUPPLEMENT, PRO-STAT SUGAR FREE 64,) LIQD Take 30 mLs by mouth 2 (two) times daily. Patient taking differently: Take 30 mLs by mouth 3 (three) times daily with meals.  12/29/17  Yes Danford, Suann Larry, MD  apixaban (ELIQUIS) 5 MG TABS tablet Take 1 tablet (5 mg total) by mouth 2 (two) times daily. 12/29/17  Yes Danford, Suann Larry, MD  atorvastatin (LIPITOR) 20 MG tablet Take 20 mg by mouth every evening.    Yes [provider]  brimonidine-timolol (COMBIGAN) 0.2-0.5 % ophthalmic solution Place 1 drop into the left eye daily.    Yes [provider]  calcium acetate (PHOSLO) 667 MG capsule Take 667 mg by mouth 3 (three) times daily with meals.   Yes [provider]  diclofenac (VOLTAREN) 0.1 % ophthalmic solution Place 1 drop into the left eye daily. Uses at lunch time   Yes [provider]  dicyclomine (BENTYL) 10 MG capsule Take 10 mg by mouth 4 (four) times daily -  before meals and at bedtime.   Yes [provider]  febuxostat (ULORIC) 40 MG tablet Take 40 mg by mouth daily.   Yes [provider]  folic acid (FOLVITE) 1 MG tablet Take 1 mg by mouth daily.   Yes [provider]  hydroxychloroquine (PLAQUENIL) 200 MG tablet Take 300 mg by mouth as directed. Take 1 1/2 tablets daily   Yes [provider]  Insulin Detemir (LEVEMIR FLEXTOUCH) 100 UNIT/ML Pen Inject 8 Units into the skin every morning.    Yes [provider]  insulin lispro (HUMALOG KWIKPEN) 100 UNIT/ML KiwkPen Inject 5 Units into the skin 3 (three) times daily with meals as needed (*Only take if blood sugar levels are over 150).    Yes [provider]  metoprolol tartrate (LOPRESSOR) 50 MG tablet Take 50 mg by mouth See admin instructions. Take 1 tablet twice a day, except on Tuesday, Thursday, Saturday, take 1 tablet daily in the morning.   Yes [provider]    Multiple Vitamins-Minerals (CENTRUM SILVER PO) Take 1 tablet by mouth daily.   Yes [provider]  oxyCODONE-acetaminophen (PERCOCET/ROXICET) 5-325 MG tablet Take 1 tablet by mouth every 6 (six) hours as needed for severe pain. 01/13/18  Yes Fields, Jessy Oto, MD  predniSONE (DELTASONE) 5 MG tablet Take 5 mg by mouth daily with breakfast.   Yes [provider]  silver sulfADIAZINE (SILVADENE) 1 % cream Apply 1 application topically daily.   Yes [provider]  travoprost, benzalkonium, (TRAVATAN) 0.004 % ophthalmic solution Place 1 drop into the left eye at bedtime.   Yes [provider]    Family History Family History  Problem Relation Age of Onset  . Diabetes Brother   . Diabetes Father   . Hypertension Father   . Hypertension Sister   . Stroke Mother   . Diabetes Sister   . Hypertension Brother   . Colon cancer Neg Hx     Social History Social History   Tobacco Use  . Smoking status: Former Smoker    Packs/day: 1.00    Years: 30.00    Pack years: 30.00    Types: Cigarettes    Last attempt to quit: 08/29/2012    Years since quitting: 5.5  . Smokeless tobacco: Never Used  . Tobacco comment: quit 08-2012  Substance Use Topics  . Alcohol use: No    Alcohol/week: 0.0 oz  . Drug use: No     Allergies   Ace inhibitors   Review of Systems Review of Systems  Constitutional: Negative for appetite change and fatigue.  HENT: Negative for congestion, ear discharge and sinus pressure.   Eyes: Negative for discharge.  Respiratory: Negative for cough.   Cardiovascular: Negative for chest pain.  Gastrointestinal: Negative for abdominal pain and diarrhea.  Genitourinary: Negative for frequency and hematuria.  Musculoskeletal: Negative for back pain.       Right lower leg draining wound  Skin: Negative for rash.  Neurological: Negative for seizures and headaches.  Psychiatric/Behavioral: Negative for hallucinations.     Physical  Exam Updated Vital Signs BP (!) 198/53   Pulse 70   Temp 97.6 F (36.4 C) (Oral)   Resp 17   Ht 5\' 4"  (1.626 m)   Wt 81.2 kg (179 lb)   SpO2 100%   BMI 30.73 kg/m   Physical Exam  Constitutional: She is oriented to  person, place, and time. She appears well-developed.  HENT:  Head: Normocephalic.  Eyes: Conjunctivae and EOM are normal. No scleral icterus.  Neck: Neck supple. No thyromegaly present.  Cardiovascular: Normal rate and regular rhythm. Exam reveals no gallop and no friction rub.  No murmur heard. Pulmonary/Chest: No stridor. She has no wheezes. She has no rales. She exhibits no tenderness.  Abdominal: She exhibits no distension. There is no tenderness. There is no rebound.  Musculoskeletal: Normal range of motion. She exhibits no edema.  Right lower leg has open wound foul-smelling discharge and also some eschar  Lymphadenopathy:    She has no cervical adenopathy.  Neurological: She is oriented to person, place, and time. She exhibits normal muscle tone. Coordination normal.  Skin: No rash noted. No erythema.  Psychiatric: She has a normal mood and affect. Her behavior is normal.     ED Treatments / Results  Labs (all labs ordered are listed, but only abnormal results are displayed) Labs Reviewed  COMPREHENSIVE METABOLIC PANEL - Abnormal; Notable for the following components:      Result Value   Chloride 95 (*)    Glucose, Bld 184 (*)    BUN 80 (*)    Creatinine, Ser 6.36 (*)    Albumin 2.6 (*)    GFR calc non Af Amer 6 (*)    GFR calc Af Amer 7 (*)    All other components within normal limits  CBC WITH DIFFERENTIAL/PLATELET - Abnormal; Notable for the following components:   WBC 11.1 (*)    Hemoglobin 10.7 (*)    MCHC 29.4 (*)    RDW 16.8 (*)    Neutro Abs 9.6 (*)    All other components within normal limits  CBG MONITORING, ED - Abnormal; Notable for the following components:   Glucose-Capillary 123 (*)    All other components within normal limits   LACTIC ACID, PLASMA  URINALYSIS, ROUTINE W REFLEX MICROSCOPIC    EKG None  Radiology Dg Chest 2 View  Result Date: 03/01/2018 CLINICAL DATA:  Chronic soft tissue wound of the right lower leg. EXAM: CHEST - 2 VIEW COMPARISON:  Chest x-ray dated 12/14/2017 FINDINGS: Slight cardiomegaly. Pulmonary vascularity is normal. Calcification of the thoracic aorta. Double lumen central dialysis catheter in place with the tip at the cavoatrial junction. No infiltrates or effusions.  No acute bone abnormality. IMPRESSION: No acute disease in the chest.  Chronic cardiomegaly. Aortic Atherosclerosis (ICD10-I70.0). Electronically Signed   By: Lorriane Shire M.D.   On: 03/01/2018 13:57   Dg Tibia/fibula Right  Result Date: 03/01/2018 CLINICAL DATA:  Anterior right lower leg wound. EXAM: RIGHT TIBIA AND FIBULA - 2 VIEW COMPARISON:  12/14/2017 FINDINGS: Old fracture deformity in the distal right fibula with screw in place, stable appearance. No acute fracture, subluxation or dislocation. No radiographic changes of osteomyelitis. Soft tissue ulcer/defect noted laterally over the distal fibular shaft. IMPRESSION: No visible acute bony abnormality. No radiographic changes of osteomyelitis. Electronically Signed   By: Rolm Baptise M.D.   On: 03/01/2018 18:06    Procedures Procedures (including critical care time)  Medications Ordered in ED Medications  vancomycin (VANCOCIN) IVPB 1000 mg/200 mL premix (has no administration in time range)     Initial Impression / Assessment and Plan / ED Course  I have reviewed the triage vital signs and the nursing notes.  Pertinent labs & imaging results that were available during my care of the patient were reviewed by me and considered in my medical  decision making (see chart for details).     Patient with infected right lower leg poorly healing area.  Patient will be admitted to medicine for IV antibiotics with surgical consult  Final Clinical Impressions(s) / ED  Diagnoses   Final diagnoses:  Visit for wound check  Cellulitis of right leg    ED Discharge Orders    None       Milton Ferguson, MD 03/01/18 1958

## 2018-03-01 NOTE — Telephone Encounter (Signed)
Magda Paganini called back from ED - she has to talk w/Dr. Roderic Palau b/c pt has dialysis and the husband wants her tx in the hospital. Hold the order for now per [facility] Nurse ED

## 2018-03-01 NOTE — H&P (Signed)
TRH H&P    Patient Demographics:    [name], is a 72 y.o. female  MRN: [id number]  DOB - June 23, 1946  Admit Date - 03/01/2018  Referring MD/NP/PA: Dr. Roderic Palau  Outpatient Primary MD for the patient is Sharilyn Sites, MD  Patient coming from: Home  Chief complaint-right leg wound   HPI:    [name]  is a 72 y.o. 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  Patient was seen by home health RN who spoke to Dr. Hilma Favors after checking on the wound and he  recommended patient to go to ED for further evaluation. Patient denies any symptoms no fever or chills. No chest pain or shortness of breath. Denies dysuria. No nausea vomiting or diarrhea. In the ED patient was given vancomycin Patient gets hemodialysis Tuesday Thursday and Saturday No previous history of stroke or seizures. Patient underwent arteriogram by vascular surgery which showed no significant arterial obstruction.    Review of systems:      All other systems reviewed and are negative.   With Past History of the following :    Past Medical History:  Diagnosis Date  . Anemia   . Arthritis   . Asthma   . Atrial fibrillation ([place])   . Atrial flutter [facility])    TEE/DCCV December 2013 - on Xarelto [facility])  . Bone spur of toe    Right 5th toe  . Chronic diastolic CHF (congestive heart failure) (HCC)    a. EF 50-55% by echo in 2015  . CKD (chronic kidney disease) stage 4, GFR 15-29 ml/min (HCC)    a. s/p fistula placement but not on HD. Followed by Nephrology.   . Colon polyposis   . COPD (chronic obstructive pulmonary disease) ([place])   . DVT of upper extremity (deep vein thrombosis) ([place])    Right basilic  vein, June 8144  . Essential hypertension, benign   . Fractures   . Glaucoma   . SLE (systemic lupus erythematosus) ([place])   . Type 2 diabetes mellitus ([place])   . UTI (lower urinary tract infection)       Past Surgical History:  Procedure Laterality Date  . ABDOMINAL AORTOGRAM W/LOWER EXTREMITY N/A 12/18/2017   Procedure: ABDOMINAL AORTOGRAM W/LOWER EXTREMITY;  Surgeon: Elam Dutch, MD;  Location: [place] [facility];  Service: Cardiovascular;  Laterality: N/A;  bilateral  . ABDOMINAL HYSTERECTOMY  1983  . ANKLE SURGERY  1993  . AV FISTULA PLACEMENT Left 03/27/2014   Procedure: ARTERIOVENOUS (AV) FISTULA CREATION;  Surgeon: Rosetta Posner, MD;  Location: [place];  Service: Vascular;  Laterality: Left;  . BACK SURGERY  1980  . BASCILIC VEIN TRANSPOSITION Left 01/13/2018   Procedure: LEFT BASILIC VEIN TRANSPOSITION SECOND STAGE;  Surgeon: Elam Dutch, MD;  Location: [place];  Service: Vascular;  Laterality: Left;  . CARDIOVASCULAR STRESS TEST  12/19/2009  no stress induced rhythm abnormalities, ekg negative for ischemia  . CARDIOVERSION  09/16/2012   Procedure: CARDIOVERSION;  Surgeon: Sanda Klein, MD;  Location: [facility];  Service: Cardiovascular;  Laterality: N/A;  . CATARACT EXTRACTION    . COLONOSCOPY  09/20/2012   Dr. [name]: multiple tubular adenomas   . COLONOSCOPY  2005   Dr. [name]. Polyps, path unknown   . COLONOSCOPY N/A 07/24/2016   Procedure: COLONOSCOPY;  Surgeon: [name], MD;  [facility];  Service: Endoscopy;  Laterality: N/A;  9:00 am  . ESOPHAGOGASTRODUODENOSCOPY  09/20/2012   Dr. [name]: duodenal erosion and possible resolving ulcer at the angularis   . EYE SURGERY    . IR FLUORO GUIDE CV LINE RIGHT  11/09/2017  . IR US GUIDE VASC ACCESS RIGHT  11/09/2017  . TEE WITHOUT CARDIOVERSION  09/16/2012   Procedure: TRANSESOPHAGEAL ECHOCARDIOGRAM (TEE);  Surgeon: Sanda Klein, MD;  Location: [facility] ENDOSCOPY;  Service: Cardiovascular;  Laterality:  N/A;  . TRANSESOPHAGEAL ECHOCARDIOGRAM WITH CARDIOVERSION  09/16/2012   EF 60-65%, moderate LVH, moderate regurg of the aortic valve, LA moderately dilated      Social History:      Social History   Tobacco Use  . Smoking status: Former Smoker    Packs/day: 1.00    Years: 30.00    Pack years: 30.00    Types: Cigarettes    Last attempt to quit: 08/29/2012    Years since quitting: 5.5  . Smokeless tobacco: Never Used  . Tobacco comment: quit 08-2012  Substance Use Topics  . Alcohol use: No    Alcohol/week: 0.0 oz       Family History :     Family History  Problem Relation Age of Onset  . Diabetes Brother   . Diabetes Father   . Hypertension Father   . Hypertension Sister   . Stroke Mother   . Diabetes Sister   . Hypertension Brother   . Colon cancer Neg Hx       Home Medications:   Prior to Admission medications   Medication Sig Start Date End Date Taking? Authorizing Provider  acetaminophen (TYLENOL) 500 MG tablet Take 1,000 mg by mouth every 6 (six) hours as needed. For pain   Yes [provider]  albuterol (ACCUNEB) 0.63 MG/3ML nebulizer solution Take 1 ampule by nebulization every 6 (six) hours as needed for wheezing or shortness of breath.    Yes [provider]  albuterol (PROVENTIL HFA;VENTOLIN HFA) 108 (90 BASE) MCG/ACT inhaler Inhale 2 puffs into the lungs every 6 (six) hours as needed. For shortness of breath   Yes [provider]  Amino Acids-Protein Hydrolys (FEEDING SUPPLEMENT, PRO-STAT SUGAR FREE 64,) LIQD Take 30 mLs by mouth 2 (two) times daily. Patient taking differently: Take 30 mLs by mouth 3 (three) times daily with meals.  12/29/17  Yes Danford, Suann Larry, MD  apixaban (ELIQUIS) 5 MG TABS tablet Take 1 tablet (5 mg total) by mouth 2 (two) times daily. 12/29/17  Yes Danford, Suann Larry, MD  atorvastatin (LIPITOR) 20 MG tablet Take 20 mg by mouth every evening.    Yes [provider]  brimonidine-timolol  (COMBIGAN) 0.2-0.5 % ophthalmic solution Place 1 drop into the left eye daily.    Yes [provider]  calcium acetate (PHOSLO) 667 MG capsule Take 667 mg by mouth 3 (three) times daily with meals.   Yes [provider]  diclofenac (VOLTAREN) 0.1 % ophthalmic solution Place 1 drop into the  left eye daily. Uses at lunch time   Yes [provider]  dicyclomine (BENTYL) 10 MG capsule Take 10 mg by mouth 4 (four) times daily -  before meals and at bedtime.   Yes [provider]  febuxostat (ULORIC) 40 MG tablet Take 40 mg by mouth daily.   Yes [provider]  folic acid (FOLVITE) 1 MG tablet Take 1 mg by mouth daily.   Yes [provider]  hydroxychloroquine (PLAQUENIL) 200 MG tablet Take 300 mg by mouth as directed. Take 1 1/2 tablets daily   Yes [provider]  Insulin Detemir (LEVEMIR FLEXTOUCH) 100 UNIT/ML Pen Inject 8 Units into the skin every morning.    Yes [provider]  insulin lispro (HUMALOG KWIKPEN) 100 UNIT/ML KiwkPen Inject 5 Units into the skin 3 (three) times daily with meals as needed (*Only take if blood sugar levels are over 150).    Yes [provider]  metoprolol tartrate (LOPRESSOR) 50 MG tablet Take 50 mg by mouth See admin instructions. Take 1 tablet twice a day, except on Tuesday, Thursday, Saturday, take 1 tablet daily in the morning.   Yes [provider]  Multiple Vitamins-Minerals (CENTRUM SILVER PO) Take 1 tablet by mouth daily.   Yes [provider]  oxyCODONE-acetaminophen (PERCOCET/ROXICET) 5-325 MG tablet Take 1 tablet by mouth every 6 (six) hours as needed for severe pain. 01/13/18  Yes Fields, Jessy Oto, MD  predniSONE (DELTASONE) 5 MG tablet Take 5 mg by mouth daily with breakfast.   Yes [provider]  silver sulfADIAZINE (SILVADENE) 1 % cream Apply 1 application topically daily.   Yes [provider]  travoprost, benzalkonium, (TRAVATAN) 0.004 %  ophthalmic solution Place 1 drop into the left eye at bedtime.   Yes [provider]     Allergies:     Allergies  Allergen Reactions  . Ace Inhibitors Cough     Physical Exam:   Vitals  Blood pressure (!) 198/53, pulse 70, temperature 97.6 F (36.4 C), temperature source Oral, resp. rate 17, height 5\' 4"  (1.626 m), weight 81.2 kg (179 lb), SpO2 100 %.  1.  General: Appears in no acute distress  2. Psychiatric:  Intact judgement and  insight, awake alert, oriented x 3.  3. Neurologic: No focal neurological deficits, all cranial nerves intact.Strength 5/5 all 4 extremities, sensation intact all 4 extremities, plantars down going.  4. Eyes :  anicteric sclerae, moist conjunctivae with no lid lag. PERRLA.  5. ENMT:  Oropharynx clear with moist mucous membranes and good dentition  6. Neck:  supple, no cervical lymphadenopathy appriciated, No thyromegaly  7. Respiratory : Normal respiratory effort, good air movement bilaterally,clear to  auscultation bilaterally  8. Cardiovascular : RRR, no gallops, rubs or murmurs, no leg edema  9. Gastrointestinal:  Positive bowel sounds, abdomen soft, non-tender to palpation,no hepatosplenomegaly, no rigidity or guarding       10. Skin:  Large ulcer noted in the right calf, necrotic skin noted, foul-smelling  11.Musculoskeletal:  Good muscle tone,  joints appear normal , no effusions,  normal range of motion    Data Review:    CBC Recent Labs  Lab 03/01/18 1423  WBC 11.1*  HGB 10.7*  HCT 36.4  PLT 272  MCV 92.6  MCH 27.2  MCHC 29.4*  RDW 16.8*  LYMPHSABS 0.8  MONOABS 0.6  EOSABS 0.1  BASOSABS 0.0   ------------------------------------------------------------------------------------------------------------------  Chemistries  Recent Labs  Lab 03/01/18 1423  NA 138  K 4.3  CL 95*  CO2 29  GLUCOSE 184*  BUN 80*  CREATININE 6.36*  CALCIUM 9.6  AST 17  ALT 14  ALKPHOS 64  BILITOT 0.4    ------------------------------------------------------------------------------------------------------------------  ------------------------------------------------------------------------------------------------------------------ GFR: Estimated Creatinine Clearance: 8.2 mL/min (A) (by C-G formula based on SCr of 6.36 mg/dL (H)). Liver Function Tests: Recent Labs  Lab 03/01/18 1423  AST 17  ALT 14  ALKPHOS 64  BILITOT 0.4  PROT 7.1  ALBUMIN 2.6*   No results for input(s): LIPASE, AMYLASE in the last 168 hours. No results for input(s): AMMONIA in the last 168 hours. Coagulation Profile: No results for input(s): INR, PROTIME in the last 168 hours. Cardiac Enzymes: No results for input(s): CKTOTAL, CKMB, CKMBINDEX, TROPONINI in the last 168 hours. BNP (last 3 results) No results for input(s): PROBNP in the last 8760 hours. HbA1C: No results for input(s): HGBA1C in the last 72 hours. CBG: Recent Labs  Lab 03/01/18 1851  GLUCAP 123*   Lipid Profile: No results for input(s): CHOL, HDL, LDLCALC, TRIG, CHOLHDL, LDLDIRECT in the last 72 hours. Thyroid Function Tests: No results for input(s): TSH, T4TOTAL, FREET4, T3FREE, THYROIDAB in the last 72 hours. Anemia Panel: No results for input(s): VITAMINB12, FOLATE, FERRITIN, TIBC, IRON, RETICCTPCT in the last 72 hours.  --------------------------------------------------------------------------------------------------------------- Urine analysis:    Component Value Date/Time   COLORURINE AMBER (A) 11/19/2017 0300   APPEARANCEUR HAZY (A) 11/19/2017 0300   LABSPEC 1.021 11/19/2017 0300   PHURINE 5.0 11/19/2017 0300   GLUCOSEU NEGATIVE 11/19/2017 0300   HGBUR NEGATIVE 11/19/2017 0300   BILIRUBINUR NEGATIVE 11/19/2017 0300   KETONESUR NEGATIVE 11/19/2017 0300   PROTEINUR NEGATIVE 11/19/2017 0300   UROBILINOGEN 1.0 03/17/2014 1830   NITRITE NEGATIVE 11/19/2017 0300   LEUKOCYTESUR MODERATE (A) 11/19/2017 0300      Imaging  Results:    Dg Chest 2 View  Result Date: 03/01/2018 CLINICAL DATA:  Chronic soft tissue wound of the right lower leg. EXAM: CHEST - 2 VIEW COMPARISON:  Chest x-ray dated 12/14/2017 FINDINGS: Slight cardiomegaly. Pulmonary vascularity is normal. Calcification of the thoracic aorta. Double lumen central dialysis catheter in place with the tip at the cavoatrial junction. No infiltrates or effusions.  No acute bone abnormality. IMPRESSION: No acute disease in the chest.  Chronic cardiomegaly. Aortic Atherosclerosis (ICD10-I70.0). Electronically Signed   By: Lorriane Shire M.D.   On: 03/01/2018 13:57   Dg Tibia/fibula Right  Result Date: 03/01/2018 CLINICAL DATA:  Anterior right lower leg wound. EXAM: RIGHT TIBIA AND FIBULA - 2 VIEW COMPARISON:  12/14/2017 FINDINGS: Old fracture deformity in the distal right fibula with screw in place, stable appearance. No acute fracture, subluxation or dislocation. No radiographic changes of osteomyelitis. Soft tissue ulcer/defect noted laterally over the distal fibular shaft. IMPRESSION: No visible acute bony abnormality. No radiographic changes of osteomyelitis. Electronically Signed   By: Rolm Baptise M.D.   On: 03/01/2018 18:06       Assessment & Plan:    Active Problems:   Type 2 diabetes mellitus with diabetic neuropathy (HCC)   Essential hypertension, benign   ESRD (end stage renal disease) on dialysis (HCC)   Ulcer of right calf (HCC)   Leg wound, right   1. Ulcer of right calf-patient has chronic ulcer on right calf, likely from calciphylaxis, he was seen by vascular surgery and found that she does not have any arterial obstruction.  She was offered amputation but she refused.  Will start patient on vancomycin per pharmacy consultation.  Consult wound care for debridement in a.m. 2. ESRD on hemodialysis-patient gets hemodialysis Tuesday Thursday and Saturday, will consult nephrology in a.m. for hemodialysis. 3. Diabetes mellitus-type II with diabetic  neuropathy-continue Lantus, will start sliding scale insulin with NovoLog.   4. Atrial fibrillation- heart rate is stable, continue Lopressor, apixaban for anticoagulation    DVT Prophylaxis-patient on apixaban  AM Labs Ordered, also please review Full Orders  Family Communication: Admission, patients condition and plan of care including tests being ordered have been discussed with the patient  who indicate understanding and agree with the plan and Code Status.  Code Status: Full code  Admission status: Observation  Time spent in minutes : 60 minutes   Oswald Hillock M.D on 03/01/2018 at 7:56 PM  Between 7am to 7pm - Pager - [phone]. After 7pm go to [email or web address] - password [facility]  [facility] - Office  [phone]

## 2018-03-01 NOTE — Telephone Encounter (Signed)
Magda Paganini called back from ED - /Dr. Roderic Palau will admitt patient and cx order for wound eval and tx- per [facility] Nurse ED

## 2018-03-01 NOTE — ED Notes (Signed)
Placed dry dressing on wound

## 2018-03-01 NOTE — ED Notes (Signed)
Pt states she feels like her sugar is low  

## 2018-03-01 NOTE — ED Notes (Signed)
Pt has appt with wound center tomorrow on 03/02/18 at 2:15.  Notified patient and her husband and was told pt has dialysis tomorrow and can't be there at 2:15.  Husband says he doesn't want to go to the wound center, he wants pt admitted and have the wound debrided here.  Dr. Roderic Palau talking with pt.

## 2018-03-01 NOTE — Progress Notes (Signed)
Pharmacy Antibiotic Note  [name] is a 72 y.o. female admitted on 03/01/2018 with Wound Infection.  Pharmacy has been consulted for Vancomcyin dosing.  Initial dose given in ED.  Plan: Give an additional 500mg  IV Vancomycin to complete loading dose. F/U HD schedule for subsequent Vancomycin doses. Pre-Hemodialysis Vancomycin level goal range =15-25 mcg/ml   Height: 5\' 4"  (162.6 cm) Weight: 179 lb (81.2 kg) IBW/kg (Calculated) : 54.7  Temp (24hrs), Avg:97.6 F (36.4 C), Min:97.6 F (36.4 C), Max:97.6 F (36.4 C)  Recent Labs  Lab 03/01/18 1423  WBC 11.1*  CREATININE 6.36*  LATICACIDVEN 1.3    Estimated Creatinine Clearance: 8.2 mL/min (A) (by C-G formula based on SCr of 6.36 mg/dL (H)).    Allergies  Allergen Reactions  . Ace Inhibitors Cough   Antimicrobials this admission: Vancomycin 6/3 >>    >>   Dose adjustments this admission: ESRD dosing  Microbiology results:  BCx:   UCx:    Sputum:    MRSA PCR:   Thank you for allowing pharmacy to be a part of this patient's care.  Pricilla Larsson 03/01/2018 8:27 PM

## 2018-03-01 NOTE — ED Triage Notes (Signed)
Pt has necrotic wound on right lower leg which is being evaluated by home health.  [facility] came today and the wound had slough, eschar, and odor.  Called Dr. Hilma Favors for new orders and was told to send to ED for evaluation.  Pt was previously recommended to have amputation, but she refuses.

## 2018-03-02 ENCOUNTER — Other Ambulatory Visit: Payer: Self-pay

## 2018-03-02 ENCOUNTER — Ambulatory Visit (HOSPITAL_COMMUNITY): Payer: Medicare Other | Admitting: Physical Therapy

## 2018-03-02 DIAGNOSIS — S81801D Unspecified open wound, right lower leg, subsequent encounter: Secondary | ICD-10-CM | POA: Diagnosis not present

## 2018-03-02 DIAGNOSIS — L97213 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scle: Secondary | ICD-10-CM | POA: Diagnosis not present

## 2018-03-02 DIAGNOSIS — N186 End stage renal disease: Secondary | ICD-10-CM | POA: Diagnosis not present

## 2018-03-02 DIAGNOSIS — Z992 Dependence on renal dialysis: Secondary | ICD-10-CM | POA: Diagnosis not present

## 2018-03-02 LAB — CBC
HEMATOCRIT: 36.2 % (ref 36.0–46.0)
Hemoglobin: 10.7 g/dL — ABNORMAL LOW (ref 12.0–15.0)
MCH: 27.2 pg (ref 26.0–34.0)
MCHC: 29.6 g/dL — AB (ref 30.0–36.0)
MCV: 91.9 fL (ref 78.0–100.0)
Platelets: 271 10*3/uL (ref 150–400)
RBC: 3.94 MIL/uL (ref 3.87–5.11)
RDW: 16.7 % — AB (ref 11.5–15.5)
WBC: 10.4 10*3/uL (ref 4.0–10.5)

## 2018-03-02 LAB — HEMOGLOBIN A1C
Hgb A1c MFr Bld: 5.2 % (ref 4.8–5.6)
MEAN PLASMA GLUCOSE: 102.54 mg/dL

## 2018-03-02 LAB — COMPREHENSIVE METABOLIC PANEL
ALBUMIN: 2.5 g/dL — AB (ref 3.5–5.0)
ALT: 12 U/L — ABNORMAL LOW (ref 14–54)
ANION GAP: 12 (ref 5–15)
AST: 17 U/L (ref 15–41)
Alkaline Phosphatase: 60 U/L (ref 38–126)
BILIRUBIN TOTAL: 0.4 mg/dL (ref 0.3–1.2)
BUN: 93 mg/dL — ABNORMAL HIGH (ref 6–20)
CO2: 27 mmol/L (ref 22–32)
Calcium: 9.2 mg/dL (ref 8.9–10.3)
Chloride: 98 mmol/L — ABNORMAL LOW (ref 101–111)
Creatinine, Ser: 6.6 mg/dL — ABNORMAL HIGH (ref 0.44–1.00)
GFR calc Af Amer: 7 mL/min — ABNORMAL LOW (ref >60)
GFR calc non Af Amer: 6 mL/min — ABNORMAL LOW (ref >60)
GLUCOSE: 112 mg/dL — AB (ref 65–99)
POTASSIUM: 4.5 mmol/L (ref 3.5–5.1)
SODIUM: 137 mmol/L (ref 135–145)
Total Protein: 6.5 g/dL (ref 6.5–8.1)

## 2018-03-02 LAB — MRSA PCR SCREENING: MRSA BY PCR: NEGATIVE

## 2018-03-02 LAB — GLUCOSE, CAPILLARY
GLUCOSE-CAPILLARY: 113 mg/dL — AB (ref 65–99)
GLUCOSE-CAPILLARY: 127 mg/dL — AB (ref 65–99)
Glucose-Capillary: 120 mg/dL — ABNORMAL HIGH (ref 65–99)
Glucose-Capillary: 97 mg/dL (ref 65–99)

## 2018-03-02 MED ORDER — TIMOLOL MALEATE 0.5 % OP SOLN
1.0000 [drp] | Freq: Every day | OPHTHALMIC | Status: DC
  Administered 2018-03-02 – 2018-03-03 (×2): 1 [drp] via OPHTHALMIC
  Filled 2018-03-02: qty 5

## 2018-03-02 MED ORDER — CHLORHEXIDINE GLUCONATE CLOTH 2 % EX PADS
6.0000 | MEDICATED_PAD | Freq: Every day | CUTANEOUS | Status: DC
  Administered 2018-03-02 – 2018-03-03 (×2): 6 via TOPICAL

## 2018-03-02 MED ORDER — LIDOCAINE HCL (PF) 1 % IJ SOLN: 5.0000 mL | INTRAMUSCULAR | Status: DC | PRN

## 2018-03-02 MED ORDER — LATANOPROST 0.005 % OP SOLN
1.0000 [drp] | Freq: Every day | OPHTHALMIC | Status: DC
  Administered 2018-03-02: 1 [drp] via OPHTHALMIC
  Filled 2018-03-02: qty 2.5

## 2018-03-02 MED ORDER — SODIUM CHLORIDE 0.9 % IV SOLN: 100.0000 mL | INTRAVENOUS | Status: DC | PRN

## 2018-03-02 MED ORDER — LIDOCAINE-PRILOCAINE 2.5-2.5 % EX CREA: 1.0000 "application " | TOPICAL_CREAM | CUTANEOUS | Status: DC | PRN

## 2018-03-02 MED ORDER — PENTAFLUOROPROP-TETRAFLUOROETH EX AERO: 1.0000 "application " | INHALATION_SPRAY | CUTANEOUS | Status: DC | PRN

## 2018-03-02 MED ORDER — BRIMONIDINE TARTRATE 0.2 % OP SOLN
1.0000 [drp] | Freq: Every day | OPHTHALMIC | Status: DC
  Administered 2018-03-02 – 2018-03-03 (×2): 1 [drp] via OPHTHALMIC
  Filled 2018-03-02: qty 5

## 2018-03-02 MED ORDER — ALTEPLASE 2 MG IJ SOLR
2.0000 mg | Freq: Once | INTRAMUSCULAR | Status: DC | PRN
  Filled 2018-03-02: qty 2

## 2018-03-02 MED ORDER — HEPARIN SODIUM (PORCINE) 1000 UNIT/ML DIALYSIS
1000.0000 [IU] | INTRAMUSCULAR | Status: DC | PRN
  Administered 2018-03-02: 3200 [IU] via INTRAVENOUS_CENTRAL
  Filled 2018-03-02 (×2): qty 1

## 2018-03-02 NOTE — Consult Note (Signed)
   [facility] CM Inpatient Consult   03/02/2018  OMIE FERGER January 24, 1946 [id number]   Referral received to assess for care management services. Met with the patient regarding the benefits of [facility] services. Explained that [facility] is a covered benefit of patient's [employer]. Reviewed information for [place] and a brochure was provided with contact information.  Explained that [place] [facility] [facility] does not interfere with or replace any services arranged by the inpatient care management staff.  Patient declined services with [facility] at this time. Inpatient case manager made aware of patient's decision. For questions or concerns please contact:  Ireene Ballowe RN, [facility] Liaison  [phone]) Business Mobile [phone]) Toll free office

## 2018-03-02 NOTE — Consult Note (Signed)
Reason for Consult: End-stage renal disease Referring Physician: Dr. Donah Driver [name] is an 72 y.o. female.  HPI: She is a patient who has history of diabetes, hypertension, DVT, end-stage renal disease on maintenance hemodialysis presently came to the hospital because of nonhealing right leg wound.  Patient states that she has this wound for some time but seems to be nonhealing.  At this moment she denies any fever chills or sweating.  Patient also denies any difficulty breathing.  Consult is called for dialysis as patient is to be dialyzed today.  Past Medical History:  Diagnosis Date  . Anemia   . Arthritis   . Asthma   . Atrial fibrillation ([place])   . Atrial fl[facility])    TEE/DCCV December 2013 - on Xarelto [facility])  . Bone spur of toe    Right 5th toe  . Chronic diastolic CHF (congestive heart failure) (HCC)    a. EF 50-55% by echo in 2015  . CKD (chronic kidney disease) stage 4, GFR 15-29 ml/min (HCC)    a. s/p fistula placement but not on HD. Followed by Nephrology.   . Colon polyposis   . COPD (chronic obstructive pulmonary disease) ([place])   . DVT of upper extremity (deep vein thrombosis) ([place])    Right basilic vein, June 4481  . Essential hypertension, benign   . Fractures   . Glaucoma   . SLE (systemic lupus erythematosus) ([place])   . Type 2 diabetes mellitus ([place])   . UTI (lower urinary tract infection)     Past Surgical History:  Procedure Laterality Date  . ABDOMINAL AORTOGRAM W/LOWER EXTREMITY N/A 12/18/2017   Procedure: ABDOMINAL AORTOGRAM W/LOWER EXTREMITY;  Surgeon: Elam Dutch, MD;  Location: [place] CV LAB;  Service: Cardiovascular;  Laterality: N/A;  bilateral  . ABDOMINAL HYSTERECTOMY  1983  . ANKLE SURGERY  1993  . AV FISTULA PLACEMENT Left 03/27/2014   Procedure: ARTERIOVENOUS (AV) FISTULA CREATION;  Surgeon: Rosetta Posner, MD;  Location: [place];  Service: Vascular;  Laterality: Left;  . BACK SURGERY  1980  . BASCILIC VEIN TRANSPOSITION  Left 01/13/2018   Procedure: LEFT BASILIC VEIN TRANSPOSITION SECOND STAGE;  Surgeon: Elam Dutch, MD;  Location: [place];  Service: Vascular;  Laterality: Left;  . CARDIOVASCULAR STRESS TEST  12/19/2009   no stress induced rhythm abnormalities, ekg negative for ischemia  . CARDIOVERSION  09/16/2012   Procedure: CARDIOVERSION;  Surgeon: Sanda Klein, MD;  Location[facility];  Service: Cardiovascular;  Laterality: N/A;  . CATARACT EXTRACTION    . COLONOSCOPY  09/20/2012   Dr. [name]: multiple tubular adenomas   . COLONOSCOPY  2005   Dr. [name]. Polyps, path unknown   . COLONOSCOPY N/A 07/24/2016   Procedure: COLONOSCOPY;  Surgeon: [name], MD;  [facility];  Service: Endoscopy;  Laterality: N/A;  9:00 am  . ESOPHAGOGASTRODUODENOSCOPY  09/20/2012   Dr. [name]: duodenal erosion and possible resolving ulcer at the angularis   . EYE SURGERY    . IR FLUORO GUIDE CV LINE RIGHT  11/09/2017  . IR US GUIDE VASC ACCESS RIGHT  11/09/2017  . TEE WITHOUT CARDIOVERSION  09/16/2012   Procedure: TRANSESOPHAGEAL ECHOCARDIOGRAM (TEE);  Surgeon: Sanda Klein, MD;  Location: [facility] ENDOSCOPY;  Service: Cardiovascular;  Laterality: N/A;  . TRANSESOPHAGEAL ECHOCARDIOGRAM WITH CARDIOVERSION  09/16/2012   EF 60-65%, moderate LVH, moderate regurg of the aortic valve, LA moderately dilated    Family History  Problem Relation Age of Onset  .  Diabetes Brother   . Diabetes Father   . Hypertension Father   . Hypertension Sister   . Stroke Mother   . Diabetes Sister   . Hypertension Brother   . Colon cancer Neg Hx     Social History:  reports that she quit smoking about 5 years ago. Her smoking use included cigarettes. She has a 30.00 pack-year smoking history. She has never used smokeless tobacco. She reports that she does not drink alcohol or use drugs.  Allergies:  Allergies  Allergen Reactions  . Ace Inhibitors Cough    Medications: I have reviewed the patient's current  medications.  Results for orders placed or performed during the hospital encounter of 03/01/18 (from the past 48 hour(s))  Comprehensive metabolic panel     Status: Abnormal   Collection Time: 03/01/18  2:23 PM  Result Value Ref Range   Sodium 138 135 - 145 mmol/L   Potassium 4.3 3.5 - 5.1 mmol/L   Chloride 95 (L) 101 - 111 mmol/L   CO2 29 22 - 32 mmol/L   Glucose, Bld 184 (H) 65 - 99 mg/dL   BUN 80 (H) 6 - 20 mg/dL   Creatinine, Ser 6.36 (H) 0.44 - 1.00 mg/dL   Calcium 9.6 8.9 - 10.3 mg/dL   Total Protein 7.1 6.5 - 8.1 g/dL   Albumin 2.6 (L) 3.5 - 5.0 g/dL   AST 17 15 - 41 U/L   ALT 14 14 - 54 U/L   Alkaline Phosphatase 64 38 - 126 U/L   Total Bilirubin 0.4 0.3 - 1.2 mg/dL   GFR calc non Af Amer 6 (L) >60 mL/min   GFR calc Af Amer 7 (L) >60 mL/min    Comment: (NOTE) The eGFR has been calculated using the CKD EPI equation. This calculation has not been validated in all clinical situations. eGFR's persistently <60 mL/min signify possible Chronic Kidney Disease.    Anion gap 14 5 - 15    Comment: Performed at [facility], [address] [place], [place] [address]  CBC with Differential     Status: Abnormal   Collection Time: 03/01/18  2:23 PM  Result Value Ref Range   WBC 11.1 (H) 4.0 - 10.5 K/uL   RBC 3.93 3.87 - 5.11 MIL/uL   Hemoglobin 10.7 (L) 12.0 - 15.0 g/dL   HCT 36.4 36.0 - 46.0 %   MCV 92.6 78.0 - 100.0 fL   MCH 27.2 26.0 - 34.0 pg   MCHC 29.4 (L) 30.0 - 36.0 g/dL   RDW 16.8 (H) 11.5 - 15.5 %   Platelets 272 150 - 400 K/uL   Neutrophils Relative % 85 %   Neutro Abs 9.6 (H) 1.7 - 7.7 K/uL   Lymphocytes Relative 8 %   Lymphs Abs 0.8 0.7 - 4.0 K/uL   Monocytes Relative 6 %   Monocytes Absolute 0.6 0.1 - 1.0 K/uL   Eosinophils Relative 1 %   Eosinophils Absolute 0.1 0.0 - 0.7 K/uL   Basophils Relative 0 %   Basophils Absolute 0.0 0.0 - 0.1 K/uL    Comment: Performed at [facility], [address], [place], [place] [address]  Lactic acid, plasma     Status:  None   Collection Time: 03/01/18  2:23 PM  Result Value Ref Range   Lactic Acid, Venous 1.3 0.5 - 1.9 mmol/L    Comment: Performed at [facility], [address] [place], [place] [address]  CBG monitoring, ED     Status: Abnormal  Collection Time: 03/01/18  6:51 PM  Result Value Ref Range   Glucose-Capillary 123 (H) 65 - 99 mg/dL  MRSA PCR Screening     Status: None   Collection Time: 03/01/18 10:47 PM  Result Value Ref Range   MRSA by PCR NEGATIVE NEGATIVE    Comment:        The GeneXpert MRSA Assay (FDA approved for NASAL specimens only), is one component of a comprehensive MRSA colonization surveillance program. It is not intended to diagnose MRSA infection nor to guide or monitor treatment for MRSA infections. Performed at [facility], [address] [place], [place] [address]   CBC     Status: Abnormal   Collection Time: 03/02/18  6:34 AM  Result Value Ref Range   WBC 10.4 4.0 - 10.5 K/uL   RBC 3.94 3.87 - 5.11 MIL/uL   Hemoglobin 10.7 (L) 12.0 - 15.0 g/dL   HCT 36.2 36.0 - 46.0 %   MCV 91.9 78.0 - 100.0 fL   MCH 27.2 26.0 - 34.0 pg   MCHC 29.6 (L) 30.0 - 36.0 g/dL   RDW 16.7 (H) 11.5 - 15.5 %   Platelets 271 150 - 400 K/uL    Comment: Performed at [facility], [address] [place], [place] [address]  Comprehensive metabolic panel     Status: Abnormal   Collection Time: 03/02/18  6:34 AM  Result Value Ref Range   Sodium 137 135 - 145 mmol/L   Potassium 4.5 3.5 - 5.1 mmol/L   Chloride 98 (L) 101 - 111 mmol/L   CO2 27 22 - 32 mmol/L   Glucose, Bld 112 (H) 65 - 99 mg/dL   BUN 93 (H) 6 - 20 mg/dL   Creatinine, Ser 6.60 (H) 0.44 - 1.00 mg/dL   Calcium 9.2 8.9 - 10.3 mg/dL   Total Protein 6.5 6.5 - 8.1 g/dL   Albumin 2.5 (L) 3.5 - 5.0 g/dL   AST 17 15 - 41 U/L   ALT 12 (L) 14 - 54 U/L   Alkaline Phosphatase 60 38 - 126 U/L   Total Bilirubin 0.4 0.3 - 1.2 mg/dL   GFR calc non Af Amer 6 (L) >60 mL/min   GFR calc Af Amer 7 (L) >60 mL/min    Comment:  (NOTE) The eGFR has been calculated using the CKD EPI equation. This calculation has not been validated in all clinical situations. eGFR's persistently <60 mL/min signify possible Chronic Kidney Disease.    Anion gap 12 5 - 15    Comment: Performed at [facility], [address] [place], [place] [address]    Dg Chest 2 View  Result Date: 03/01/2018 CLINICAL DATA:  Chronic soft tissue wound of the right lower leg. EXAM: CHEST - 2 VIEW COMPARISON:  Chest x-ray dated 12/14/2017 FINDINGS: Slight cardiomegaly. Pulmonary vascularity is normal. Calcification of the thoracic aorta. Double lumen central dialysis catheter in place with the tip at the cavoatrial junction. No infiltrates or effusions.  No acute bone abnormality. IMPRESSION: No acute disease in the chest.  Chronic cardiomegaly. Aortic Atherosclerosis (ICD10-I70.0). Electronically Signed   By: Lorriane Shire M.D.   On: 03/01/2018 13:57   Dg Tibia/fibula Right  Result Date: 03/01/2018 CLINICAL DATA:  Anterior right lower leg wound. EXAM: RIGHT TIBIA AND FIBULA - 2 VIEW COMPARISON:  12/14/2017 FINDINGS: Old fracture deformity in the distal right fibula with screw in place, stable appearance. No acute fracture, subluxation or dislocation. No radiographic changes of osteomyelitis. Soft tissue ulcer/defect noted  laterally over the distal fibular shaft. IMPRESSION: No visible acute bony abnormality. No radiographic changes of osteomyelitis. Electronically Signed   By: Rolm Baptise M.D.   On: 03/01/2018 18:06    Review of Systems  Constitutional: Negative for chills and fever.  Cardiovascular: Negative for orthopnea and leg swelling.  Gastrointestinal: Negative for abdominal pain, nausea and vomiting.  Neurological: Negative for dizziness.   Blood pressure (!) 170/52, pulse 70, temperature 98.4 F (36.9 C), temperature source Oral, resp. rate 17, height 5' 4" (1.626 m), weight 81.2 kg (179 lb), SpO2 100 %. Physical Exam  Constitutional: She is  oriented to person, place, and time. No distress.  HENT:  Mouth/Throat: No oropharyngeal exudate.  Eyes: No scleral icterus.  Neck: No JVD present.  Cardiovascular: Normal rate and regular rhythm.  Respiratory: No respiratory distress. She has no wheezes. She has no rales.  GI: She exhibits no distension. There is no tenderness.  Musculoskeletal: She exhibits no edema.  She has dressing on the right leg wound  Neurological: She is alert and oriented to person, place, and time.    Assessment/Plan: 1] chronic wound of her right leg.  Patient presently does not have any fever chills or sweating.  Possible calciphylaxis. 2] end-stage renal disease: She is status post hemodialysis on Saturday.  Presently she denies any nausea or vomiting.  Potassium is normal.  Patient is due for dialysis today. 3] diabetes 4] hypertension blood pressure is reasonably controlled 5] history of atrial fibrillation: Her heart rate is controlled 6] history of DVT 7] anemia: Her hemoglobin is within our target goal Plan: 1]We will make arrangement for patient to get dialysis for 4 hours today 2] we will try to remove about 6-2/2 L if systolic remains above 90 3] we will check her CBC and renal panel in the morning.  Laine Giovanetti S 03/02/2018, 8:04 AM

## 2018-03-02 NOTE — Care Management Note (Addendum)
Case Management Note  Patient Details  Name: [name] MRN: [id number] Date of Birth: 06/07/1946  Subjective/Objective:    Observation for LE wound. Pt from home, ind with ADL;s. Lives with husband. Active with [facility].  Pt with 3 admits in 6 months. Per surgery leg needs to be amputated there is not non-surgical treatment for her wound, pt not ready for amputation.                Action/Plan: Plans to return home with cont of [place] services. [facility] rep aware of admission and will be notified when pt discharges. Pt observation and no resumption order necessary. CM will consult spiritual care for support in decision making and [facility] for CM services as pt is very high risk to cont to be hospitalized.   Expected Discharge Date:       03/02/18           Expected Discharge Plan:  [place]  In-House Referral:  NA  Discharge planning Services  CM Consult  Post Acute Care Choice:  Home Health, Resumption of Svcs/PTA Provider Choice offered to:     DME Arranged:    DME Agency:     HH Arranged:  RN [place] Agency:  [place]  Status of Service:  Completed, signed off  If discussed at [place] of Stay Meetings, dates discussed:    Additional Comments:  Sherald Barge, RN 03/02/2018, 2:02 PM

## 2018-03-02 NOTE — Progress Notes (Signed)
Patient has black areas to her right foot: callus-like with eschar to her outer right pinky measuring .5cm x .3cm. Eschar to outer right heel measuring 1cm X 1cm. She states she goes to a "foot doctor" for this. No drainage noted.

## 2018-03-02 NOTE — Care Management Obs Status (Signed)
MEDICARE OBSERVATION STATUS NOTIFICATION   Patient Details  Name: [name] MRN: [id number] Date of Birth: Dec 06, 1945   Medicare Observation Status Notification Given:  Yes    Sherald Barge, RN 03/02/2018, 12:14 PM

## 2018-03-02 NOTE — Procedures (Signed)
     HEMODIALYSIS TREATMENT NOTE:   4 hour heparin-free dialysis completed via right IJ tunneled catheter. Exit site unremarkable. Goal met: 2.5L removed without interruption in ultrafiltration.  All blood was returned.  Hemodynamically stable throughout HD session.  Report given to Karolee Ohs, RN.   Rock[place], RN, CDN

## 2018-03-02 NOTE — Consult Note (Signed)
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  Vascular has recommend amputation, but patient refused.  Debridement is not often recommended with this disease progress, but rather conservative management to control odor, infection and pain.  Two areas on right foot (5th digit and heel) are with necrosis. These are suspected to be vascular infarcts that may or may not be associated with pressure (heel). Wound type: Disease manifestation (AKI) Pressure Injury POA: N/A Measurement: 15cm x 12cm Per Bedside RN today Wound bed: Black eschar Drainage (amount, consistency, odor) Small amount of non-purulent on old dressing Periwound: mild erythema Dressing procedure/placement/frequency: I do not believe that enzymatic debridement (Collagenase/Santyl) is warranted in this instance and would instead recommend twice daily painting with a betadine swabstick and allowing the area to air-dry. After dry, eschar can be covered with either dry gauze or a single layer of xeroform gauze for continued astringent and antimicrobial effect.  Dry dressings to secure.  Alternatively and for definitive POC, suggest surgical consultation.  Their orders will of course, supercede that of a [place].  If you desire, please order/arrange consult.  [facility] nursing team will not follow, but will remain available to this patient, the nursing and medical teams.  Please re-consult if needed. Thanks, Maudie Flakes, MSN, RN, [place], Arther Abbott  Pager# [phone]

## 2018-03-02 NOTE — Progress Notes (Addendum)
Initial Nutrition Assessment  DOCUMENTATION CODES:   Non-severe (moderate) malnutrition in context of chronic illness   Obesity  INTERVENTION:  Nepro Shake po daily, each supplement provides 425 kcal and 19 grams protein   Monitor PO intake to reach needs.  Monitor acceptance of supplements.  NUTRITION DIAGNOSIS:   Moderate malnutrition  related to decreased appetite, Chronic ESRD-HD, Lupus, CHF as evidenced by PMH (chronic disease burden), percent weight loss, mild fat depletion, mild muscle depletion.  GOAL:   Patient will meet greater than or equal to 90% of their needs  MONITOR:   PO intake, Weight trends, Supplement acceptance  REASON FOR ASSESSMENT:   Malnutrition Screening Tool   ASSESSMENT:   72 y/o female with Hx of anemia, CHF, ESRD with dialysis, COPD, HTN, and DM2.  Admitted for: right calf wound.  MST score of 4.  Pt's appetite has been poor "for months", but a typical diet includes: B-turkey bacon, eggs, toast; L- half a sandwich with chips; S- "protein/veggies/and a starch sometimes" (per spouse).  Drinks throughout the day: water mostly, with "4 oz cranberry juice" (per spouse).  Occasionally snacks between meals, ex: grapes.  Pt does grocery shopping with niece.  She takes Pro-Stat 3 times/day and vitamin Centrum Silver.  Pt does not follow diet for DM2 (just eats what she wants), but does take insulin/checks blood sugar 4 times/day and normal morning blood sugar is anywhere between 90-130.  Pt's activity lvl was described by spouse as "going to dialysis 3 times/week and doctors appts on other days".    Pt's wt history scored a 4 with the MST.  According to spouse, pt was 245 lbs last year.  Current weight is 179 lbs.  Recorded wt hx shows a drop in 21 lbs between end of Jan. 2019 and beginning of April 2019 (188lbs to 167 lbs over ~2 months), but a regain in 12 lbs between April and present (167 lbs to 179 lbs).  Initial wt loss of 21 lbs or 11% BW over 2 months  is clinically significant of severe malnutrition, but as weight seems to be stabilizing with more recent wt gain, risk for malnutrition has dropped to moderate/less than moderate (4.7% over 4 months, which is not clinically significant).  NFPE showed some signs of malnutrition, but hopefully stabilization of weight will decrease any further risk of this as long as pt meets nutrient needs.  That being said, pt's appetite has been poor.  Recorded intake is only at 50% and both Ensure Enlive's ordered today have been refused.  Intervention is to monitor PO intake to try and reach 90% or more completion along with acceptance of supplements.  Labs reviewed: Chloride 98, BUN 93, Creatinine 6.6, Glucose 112 Recent Labs  Lab 03/01/18 1423 03/02/18 0634  NA 138 137  K 4.3 4.5  CL 95* 98*  CO2 29 27  BUN 80* 93*  CREATININE 6.36* 6.60*  CALCIUM 9.6 9.2  GLUCOSE 184* 112*   Medications: Lipitor, bentyl, uloric, folic acid, insulin, prednisone, saline, oxycodone  Past Medical History:  Diagnosis Date  . Anemia   . Arthritis   . Asthma   . Atrial fibrillation ([place])   . Atrial flutter [facility])    TEE/DCCV December 2013 - on Xarelto [facility])  . Bone spur of toe    Right 5th toe  . Chronic diastolic CHF (congestive heart failure) (HCC)    a. EF 50-55% by echo in 2015  . CKD (chronic kidney disease) stage 4, GFR 15-29 ml/min (  [place])    a. s/p fistula placement but not on HD. Followed by Nephrology.   . Colon polyposis   . COPD (chronic obstructive pulmonary disease) ([place])   . DVT of upper extremity (deep vein thrombosis) ([place])    Right basilic vein, June 9480  . Essential hypertension, benign   . Fractures   . Glaucoma   . SLE (systemic lupus erythematosus) ([place])   . Type 2 diabetes mellitus ([place])   . UTI (lower urinary tract infection)     NUTRITION - FOCUSED PHYSICAL EXAM:    Most Recent Value  Orbital Region  No depletion  Upper Arm Region  No depletion  Thoracic and Lumbar Region  No  depletion  Buccal Region  No depletion  Temple Region  Severe depletion  Clavicle Bone Region  No depletion  Clavicle and Acromion Bone Region  Mild depletion  Scapular Bone Region  No depletion  Dorsal Hand  Mild depletion  Anterior Thigh Region  Moderate depletion  Posterior Calf Region  Moderate depletion  Edema (RD Assessment)  None  Skin  Reviewed  Nails  Reviewed     Diet Order:   Diet Order           Diet Carb Modified Fluid consistency: Thin; Room service appropriate? Yes  Diet effective now         EDUCATION NEEDS:   No education needs have been identified at this time  Skin:  Skin Assessment: Skin Integrity Issues: Skin Integrity Issues:: Incisions, Other (Comment) Incisions: Non pressure wounds.  1) Right calf: red, yellow, black; drainiage: minimal, w/odor.  2) Right leg. Other: Blister: right toe; bloody.  MASD: right breast.  Last BM:  6/3  Height:   Ht Readings from Last 1 Encounters:  03/01/18 5\' 4"  (1.626 m)   Weight:   Wt Readings from Last 1 Encounters:  03/02/18 173 lb 15.1 oz (78.9 kg)   Ideal Body Weight:  54.4 kg  BMI:  Body mass index is 29.86 kg/m.  Estimated Nutritional Needs:   Kcal:  1930-2090 kcal (25-27 kcal/kg Adjusted BW) Adjusted from 30-35 kcal/kg for low activity level  Protein:  93-108 g (1.2-1.4 g/kg Adjusted BW)  Fluid:  1200 mL

## 2018-03-02 NOTE — Progress Notes (Signed)
PROGRESS NOTE    [name]  ALP:[id number] DOB: June 18, 1946 DOA: 03/01/2018 PCP: Sharilyn Sites, MD     Brief Narrative:  72 year old woman admitted from home on 6/3 due to right calf wound.  She has a history of end-stage renal disease on hemodialysis, COPD, A. fib on chronic anticoagulation with Eliquis, lupus, hypertension, type 2 diabetes.  Her right calf wound is chronic and due to calciphylaxis.  Patient was apparently seen yesterday by her home health nurse who spoke to her PCP after checking on the wound and he recommended patient to come to the ED for further evaluation.  Patient underwent arteriogram by vascular surgery which showed no significant arterial obstruction, however they did recommend amputation which she does not want.   Assessment & Plan:   Active Problems:   Type 2 diabetes mellitus with diabetic neuropathy (HCC)   Essential hypertension, benign   ESRD (end stage renal disease) on dialysis (HCC)   Ulcer of right calf (HCC)   Leg wound, right   Right calf ulcer/wound -This is not acute. -Is due to calciphylaxis. -Does not appear infected, will discontinue vancomycin. -Please see below pictures for details. -Have discussed case with Dr. Arnoldo Morale, he does not believe there is a role for surgical debridement but will however see the patient.  If no plans for debridement, patient can be discharged back home with continued home health wound care and continued outpatient vascular surgery follow-up if she would like to pursue amputation in the future.  End-stage renal disease on hemodialysis -Scheduled for dialysis today, nephrology is on board.  Atrial fibrillation -Rate controlled on Lopressor. -Continue Eliquis for anticoagulation.  Type 2 diabetes with diabetic neuropathy -Well-controlled, continue current management.   DVT prophylaxis: On Eliquis Code Status: Full code Family Communication: Patient only Disposition Plan: Likely discharge home  after seen by Dr. Arnoldo Morale if no plans for surgical debridement are entertained.  Consultants:   Dr. Arnoldo Morale, surgery pending  Procedures:   None  Antimicrobials:  Anti-infectives (From admission, onward)   Start     Dose/Rate Route Frequency Ordered Stop   03/02/18 1000  hydroxychloroquine (PLAQUENIL) tablet 300 mg    Note to Pharmacy:  Take 1 1/2 tablets daily     300 mg Oral Daily 03/01/18 2113     03/01/18 2100  vancomycin (VANCOCIN) 500 mg in sodium chloride 0.9 % 100 mL IVPB     500 mg 100 mL/hr over 60 Minutes Intravenous  Once 03/01/18 2033 03/01/18 2300   03/01/18 1930  vancomycin (VANCOCIN) IVPB 1000 mg/200 mL premix     1,000 mg 200 mL/hr over 60 Minutes Intravenous  Once 03/01/18 1915 03/01/18 2056       Subjective: In bed, complains of severe pain to her right calf, states this is no different than usual.  Otherwise no acute overnight events/complaints.  Objective: Vitals:   03/01/18 1847 03/01/18 1852 03/01/18 2137 03/02/18 0610  BP: (!) 198/53  (!) 198/61 (!) 170/52  Pulse: 70  69 70  Resp:  17    Temp:   98.4 F (36.9 C) 98.4 F (36.9 C)  TempSrc:   Oral Oral  SpO2: 100%     Weight:      Height:        Intake/Output Summary (Last 24 hours) at 03/02/2018 1124 Last data filed at 03/02/2018 0349 Gross per 24 hour  Intake 46.67 ml  Output -  Net 46.67 ml   Filed Weights   03/01/18 1315  Weight: 81.2  kg (179 lb)    Examination:  General exam: Alert, awake, oriented x 3 Respiratory system: Clear to auscultation. Respiratory effort normal. Cardiovascular system:RRR. No murmurs, rubs, gallops. Gastrointestinal system: Abdomen is nondistended, soft and nontender. No organomegaly or masses felt. Normal bowel sounds heard. Central nervous system: Alert and oriented. No focal neurological deficits. Extremities: Right calf:        Psychiatry: Judgement and insight appear normal. Mood & affect appropriate.     Data Reviewed: I have personally  reviewed following labs and imaging studies  CBC: Recent Labs  Lab 03/01/18 1423 03/02/18 0634  WBC 11.1* 10.4  NEUTROABS 9.6*  --   HGB 10.7* 10.7*  HCT 36.4 36.2  MCV 92.6 91.9  PLT 272 657   Basic Metabolic Panel: Recent Labs  Lab 03/01/18 1423 03/02/18 0634  NA 138 137  K 4.3 4.5  CL 95* 98*  CO2 29 27  GLUCOSE 184* 112*  BUN 80* 93*  CREATININE 6.36* 6.60*  CALCIUM 9.6 9.2   GFR: Estimated Creatinine Clearance: 7.9 mL/min (A) (by C-G formula based on SCr of 6.6 mg/dL (H)). Liver Function Tests: Recent Labs  Lab 03/01/18 1423 03/02/18 0634  AST 17 17  ALT 14 12*  ALKPHOS 64 60  BILITOT 0.4 0.4  PROT 7.1 6.5  ALBUMIN 2.6* 2.5*   No results for input(s): LIPASE, AMYLASE in the last 168 hours. No results for input(s): AMMONIA in the last 168 hours. Coagulation Profile: No results for input(s): INR, PROTIME in the last 168 hours. Cardiac Enzymes: No results for input(s): CKTOTAL, CKMB, CKMBINDEX, TROPONINI in the last 168 hours. BNP (last 3 results) No results for input(s): PROBNP in the last 8760 hours. HbA1C: No results for input(s): HGBA1C in the last 72 hours. CBG: Recent Labs  Lab 03/01/18 1851 03/02/18 0809  GLUCAP 123* 113*   Lipid Profile: No results for input(s): CHOL, HDL, LDLCALC, TRIG, CHOLHDL, LDLDIRECT in the last 72 hours. Thyroid Function Tests: No results for input(s): TSH, T4TOTAL, FREET4, T3FREE, THYROIDAB in the last 72 hours. Anemia Panel: No results for input(s): VITAMINB12, FOLATE, FERRITIN, TIBC, IRON, RETICCTPCT in the last 72 hours. Urine analysis:    Component Value Date/Time   COLORURINE AMBER (A) 11/19/2017 0300   APPEARANCEUR HAZY (A) 11/19/2017 0300   LABSPEC 1.021 11/19/2017 0300   PHURINE 5.0 11/19/2017 0300   GLUCOSEU NEGATIVE 11/19/2017 0300   HGBUR NEGATIVE 11/19/2017 0300   BILIRUBINUR NEGATIVE 11/19/2017 0300   KETONESUR NEGATIVE 11/19/2017 0300   PROTEINUR NEGATIVE 11/19/2017 0300   UROBILINOGEN 1.0  03/17/2014 1830   NITRITE NEGATIVE 11/19/2017 0300   LEUKOCYTESUR MODERATE (A) 11/19/2017 0300   Sepsis Labs: @LABRCNTIP (procalcitonin:4,lacticidven:4)  ) Recent Results (from the past 240 hour(s))  MRSA PCR Screening     Status: None   Collection Time: 03/01/18 10:47 PM  Result Value Ref Range Status   MRSA by PCR NEGATIVE NEGATIVE Final    Comment:        The GeneXpert MRSA Assay (FDA approved for NASAL specimens only), is one component of a comprehensive MRSA colonization surveillance program. It is not intended to diagnose MRSA infection nor to guide or monitor treatment for MRSA infections. Performed at [facility], [address], [place], [place] [address]          Radiology Studies: Dg Chest 2 View  Result Date: 03/01/2018 CLINICAL DATA:  Chronic soft tissue wound of the right lower leg. EXAM: CHEST - 2 VIEW COMPARISON:  Chest x-ray dated 12/14/2017 FINDINGS:  Slight cardiomegaly. Pulmonary vascularity is normal. Calcification of the thoracic aorta. Double lumen central dialysis catheter in place with the tip at the cavoatrial junction. No infiltrates or effusions.  No acute bone abnormality. IMPRESSION: No acute disease in the chest.  Chronic cardiomegaly. Aortic Atherosclerosis (ICD10-I70.0). Electronically Signed   By: Lorriane Shire M.D.   On: 03/01/2018 13:57   Dg Tibia/fibula Right  Result Date: 03/01/2018 CLINICAL DATA:  Anterior right lower leg wound. EXAM: RIGHT TIBIA AND FIBULA - 2 VIEW COMPARISON:  12/14/2017 FINDINGS: Old fracture deformity in the distal right fibula with screw in place, stable appearance. No acute fracture, subluxation or dislocation. No radiographic changes of osteomyelitis. Soft tissue ulcer/defect noted laterally over the distal fibular shaft. IMPRESSION: No visible acute bony abnormality. No radiographic changes of osteomyelitis. Electronically Signed   By: Rolm Baptise M.D.   On: 03/01/2018 18:06        Scheduled Meds: . apixaban   5 mg Oral BID  . atorvastatin  20 mg Oral QPM  . brimonidine  1 drop Left Eye Daily   And  . timolol  1 drop Left Eye Daily  . Chlorhexidine Gluconate Cloth  6 each Topical Q0600  . dicyclomine  10 mg Oral TID AC & HS  . febuxostat  40 mg Oral Daily  . feeding supplement (ENSURE ENLIVE)  237 mL Oral BID BM  . folic acid  1 mg Oral Daily  . hydroxychloroquine  300 mg Oral Daily  . insulin aspart  0-9 Units Subcutaneous TID WC  . insulin detemir  8 Units Subcutaneous q morning - 10a  . latanoprost  1 drop Both Eyes QHS  . metoprolol tartrate  50 mg Oral 2 times per day on Sun Mon Wed Fri   And  . metoprolol tartrate  50 mg Oral Once per day on Tue Thu Sat  . predniSONE  5 mg Oral Q breakfast  . sodium chloride flush  3 mL Intravenous Q12H   Continuous Infusions: . sodium chloride 250 mL (03/01/18 2309)  . sodium chloride    . sodium chloride       LOS: 0 days    Time spent: 25 minutes.      Lelon Frohlich, MD [facility] Pager [phone]  If 7PM-7AM, please contact night-coverage [email or web address] Password [email or web address] 03/02/2018, 11:24 AM

## 2018-03-03 DIAGNOSIS — S81801D Unspecified open wound, right lower leg, subsequent encounter: Secondary | ICD-10-CM | POA: Diagnosis not present

## 2018-03-03 DIAGNOSIS — E440 Moderate protein-calorie malnutrition: Secondary | ICD-10-CM | POA: Diagnosis not present

## 2018-03-03 DIAGNOSIS — L97213 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scle: Secondary | ICD-10-CM

## 2018-03-03 DIAGNOSIS — Z992 Dependence on renal dialysis: Secondary | ICD-10-CM | POA: Diagnosis not present

## 2018-03-03 DIAGNOSIS — Z794 Long term (current) use of insulin: Secondary | ICD-10-CM | POA: Diagnosis not present

## 2018-03-03 DIAGNOSIS — E1140 Type 2 diabetes mellitus with diabetic neuropathy, unspecified: Secondary | ICD-10-CM | POA: Diagnosis not present

## 2018-03-03 DIAGNOSIS — N186 End stage renal disease: Secondary | ICD-10-CM | POA: Diagnosis not present

## 2018-03-03 DIAGNOSIS — I10 Essential (primary) hypertension: Secondary | ICD-10-CM | POA: Diagnosis not present

## 2018-03-03 LAB — GLUCOSE, CAPILLARY
GLUCOSE-CAPILLARY: 109 mg/dL — AB (ref 65–99)
Glucose-Capillary: 80 mg/dL (ref 65–99)

## 2018-03-03 NOTE — Progress Notes (Signed)
Patient states understanding of discharge instructions.  

## 2018-03-03 NOTE — Care Management Note (Signed)
Case Management Note  Patient Details  Name: [name] MRN: [id number] Date of Birth: 1945-12-15   Expected Discharge Date:  03/03/18               Expected Discharge Plan:  [place]  In-House Referral:  NA  Discharge planning Services  CM Consult  Post Acute Care Choice:  Home Health, Resumption of Svcs/PTA Provider Choice offered to:     DME Arranged:    DME Agency:     HH Arranged:  RN [place] Agency:  [facility]  Status of Service:  Completed, signed off  If discussed at [place] of Stay Meetings, dates discussed:    Additional Comments: DC home today. [facility]HA aware of DC today. CM has made referral to wound center in [place].  Sherald Barge, RN 03/03/2018, 3:15 PM

## 2018-03-03 NOTE — Discharge Summary (Signed)
Physician Discharge Summary  [name] QZR:[id number] DOB: 03-13-1946 DOA: 03/01/2018  PCP: Sharilyn Sites, MD  Admit date: 03/01/2018 Discharge date: 03/03/2018  Admitted From: Home  Disposition: Home  Recommendations for Outpatient Follow-up:  1. Follow up with PCP in 1 weeks 2. Follow up with outpatient [facility] 3. Please obtain BMP/CBC in one week 4. Please follow up on the following pending results: Final Culture Results   Home Health: RN for wound care   Discharge Condition: STABLE   CODE STATUS: FULL    Brief Hospitalization Summary: Please see all hospital notes, images, labs for full details of the hospitalization. HPI:   [name]  is a 72 y.o. 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  Patient was seen by home health RN who spoke to Dr. Hilma Favors after checking on the wound and he  recommended patient to go to ED for further evaluation. Patient denies any symptoms no fever or chills. No chest pain or shortness of breath. Denies dysuria. No nausea vomiting or diarrhea. In the ED patient was given vancomycin Patient gets hemodialysis Tuesday Thursday and Saturday No previous history of stroke or seizures. Patient underwent arteriogram by vascular surgery which showed no significant arterial obstruction.  Brief Narrative:  72 year old woman admitted from home on 6/3 due to right calf wound.  She has a history of end-stage renal disease on hemodialysis, COPD, A. fib on chronic anticoagulation with Eliquis, lupus, hypertension, type 2 diabetes.  Her right calf wound is chronic and due to calciphylaxis.  Patient was apparently seen yesterday by her home health nurse who spoke to her PCP after checking on the wound and he  recommended patient to come to the ED for further evaluation.  Patient underwent arteriogram by vascular surgery which showed no significant arterial obstruction, however they did recommend amputation which she does not want.  Assessment & Plan:   Active Problems:   Type 2 diabetes mellitus with diabetic neuropathy   Essential hypertension, benign   ESRD (end stage renal disease) on dialysis   Ulcer of right calf    Leg wound, right  Right calf ulcer/wound -This is not acute. -Is due to calciphylaxis. -Does not appear infected, discontinued vancomycin. -Discussed case with Dr. Arnoldo Morale, he does not believe there is a role for surgical debridement at this time, definitive treatment could likely be amputation but patient refuses to consider amputation at this time, patient will be discharged back home with continued home health wound care and continued outpatient vascular surgery follow-up if she would like to pursue amputation in the future.  - Arrangements have been made for patient to establish care at [facility]. She has an appointment and instruction.  Resume home health wound care with Home health RN.  Pt declined to have surgical consult for amputation consideration but was counseled that if wound did not improve then that would likely be the next step.    End-stage renal disease on hemodialysis -Resume regular home hemodialysis schedule.   Atrial fibrillation -Rate controlled on Lopressor. -Continue Eliquis for anticoagulation.  Type 2 diabetes with diabetic neuropathy -Well-controlled, continue current management.  DVT prophylaxis: On Eliquis Code Status: Full code Family Communication: Patient  Disposition Plan: Home   Consultants:   Dr. Arnoldo Morale, surgery   Discharge Diagnoses:  Active Problems:  Type 2 diabetes mellitus with diabetic neuropathy (HCC)   Essential hypertension, benign   ESRD (end stage renal disease) on dialysis (HCC)   Ulcer of  right calf (HCC)   Leg wound, right   Malnutrition of moderate degree  Discharge Instructions: Discharge Instructions    Call MD for:  difficulty breathing, headache or visual disturbances   Complete by:  As directed    Call MD for:  extreme fatigue   Complete by:  As directed    Call MD for:  persistant dizziness or light-headedness   Complete by:  As directed    Call MD for:  persistant nausea and vomiting   Complete by:  As directed    Call MD for:  redness, tenderness, or signs of infection (pain, swelling, redness, odor or green/yellow discharge around incision site)   Complete by:  As directed    Call MD for:  severe uncontrolled pain   Complete by:  As directed    Diet - low sodium heart healthy   Complete by:  As directed    Increase activity slowly   Complete by:  As directed      Allergies as of 03/03/2018      Reactions   Ace Inhibitors Cough      Medication List    TAKE these medications   acetaminophen 500 MG tablet Commonly known as:  TYLENOL Take 1,000 mg by mouth every 6 (six) hours as needed. For pain   albuterol 0.63 MG/3ML nebulizer solution Commonly known as:  ACCUNEB Take 1 ampule by nebulization every 6 (six) hours as needed for wheezing or shortness of breath.   albuterol 108 (90 Base) MCG/ACT inhaler Commonly known as:  PROVENTIL HFA;VENTOLIN HFA Inhale 2 puffs into the lungs every 6 (six) hours as needed. For shortness of breath   apixaban 5 MG Tabs tablet Commonly known as:  ELIQUIS Take 1 tablet (5 mg total) by mouth 2 (two) times daily.   atorvastatin 20 MG tablet Commonly known as:  LIPITOR Take 20 mg by mouth every evening.   calcium acetate 667 MG capsule Commonly known as:  PHOSLO Take 667 mg by mouth 3 (three) times daily with meals.   CENTRUM SILVER PO Take 1 tablet by mouth daily.   COMBIGAN 0.2-0.5 % ophthalmic solution Generic drug:  brimonidine-timolol Place 1 drop into the left eye daily.   diclofenac 0.1 % ophthalmic  solution Commonly known as:  VOLTAREN Place 1 drop into the left eye daily. Uses at lunch time   dicyclomine 10 MG capsule Commonly known as:  BENTYL Take 10 mg by mouth 4 (four) times daily -  before meals and at bedtime.   febuxostat 40 MG tablet Commonly known as:  ULORIC Take 40 mg by mouth daily.   feeding supplement (PRO-STAT SUGAR FREE 64) Liqd Take 30 mLs by mouth 2 (two) times daily. What changed:  when to take this   folic acid 1 MG tablet Commonly known as:  FOLVITE Take 1 mg by mouth daily.   HUMALOG KWIKPEN 100 UNIT/ML KiwkPen Generic drug:  insulin lispro Inject 5 Units into the skin 3 (three) times daily with meals as needed (*Only take if blood sugar levels are over 150).   hydroxychloroquine 200 MG tablet Commonly known as:  PLAQUENIL Take 300 mg by mouth as directed. Take 1 1/2 tablets daily   LEVEMIR FLEXTOUCH 100 UNIT/ML Pen Generic drug:  Insulin Detemir Inject 8 Units into the skin every morning.   metoprolol  tartrate 50 MG tablet Commonly known as:  LOPRESSOR Take 50 mg by mouth See admin instructions. Take 1 tablet twice a day, except on Tuesday, Thursday, Saturday, take 1 tablet daily in the morning.   oxyCODONE-acetaminophen 5-325 MG tablet Commonly known as:  PERCOCET/ROXICET Take 1 tablet by mouth every 6 (six) hours as needed for severe pain.   predniSONE 5 MG tablet Commonly known as:  DELTASONE Take 5 mg by mouth daily with breakfast.   silver sulfADIAZINE 1 % cream Commonly known as:  SILVADENE Apply 1 application topically daily.   travoprost (benzalkonium) 0.004 % ophthalmic solution Commonly known as:  TRAVATAN Place 1 drop into the left eye at bedtime.      Follow-up Information    [facility]   Specialty:  Rehabilitation Contact information: [place] [id number] [address]y Fe[address] [place] [phone] [place]  Follow up on 03/10/2018.   Specialty:  Wound Care Why:   [address] in the [place] 12:30pm Contact information: [address] [id number] [place] [phone]       Sharilyn Sites, MD. Schedule an appointment as soon as possible for a visit in 1 week(s).   Specialty:  Family Medicine Why:  Hospital Follow Up  Contact information: [place] [address] [phone]          Allergies  Allergen Reactions  . Ace Inhibitors Cough   Allergies as of 03/03/2018      Reactions   Ace Inhibitors Cough      Medication List    TAKE these medications   acetaminophen 500 MG tablet Commonly known as:  TYLENOL Take 1,000 mg by mouth every 6 (six) hours as needed. For pain   albuterol 0.63 MG/3ML nebulizer solution Commonly known as:  ACCUNEB Take 1 ampule by nebulization every 6 (six) hours as needed for wheezing or shortness of breath.   albuterol 108 (90 Base) MCG/ACT inhaler Commonly known as:  PROVENTIL HFA;VENTOLIN HFA Inhale 2 puffs into the lungs every 6 (six) hours as needed. For shortness of breath   apixaban 5 MG Tabs tablet Commonly known as:  ELIQUIS Take 1 tablet (5 mg total) by mouth 2 (two) times daily.   atorvastatin 20 MG tablet Commonly known as:  LIPITOR Take 20 mg by mouth every evening.   calcium acetate 667 MG capsule Commonly known as:  PHOSLO Take 667 mg by mouth 3 (three) times daily with meals.   CENTRUM SILVER PO Take 1 tablet by mouth daily.   COMBIGAN 0.2-0.5 % ophthalmic solution Generic drug:  brimonidine-timolol Place 1 drop into the left eye daily.   diclofenac 0.1 % ophthalmic solution Commonly known as:  VOLTAREN Place 1 drop into the left eye daily. Uses at lunch time   dicyclomine 10 MG capsule Commonly known as:  BENTYL Take 10 mg by mouth 4 (four) times daily -  before meals and at bedtime.   febuxostat 40 MG tablet Commonly known as:  ULORIC Take 40 mg by mouth daily.    feeding supplement (PRO-STAT SUGAR FREE 64) Liqd Take 30 mLs by mouth 2 (two) times daily. What changed:  when to take this   folic acid 1 MG tablet Commonly known as:  FOLVITE Take 1 mg by mouth daily.   HUMALOG KWIKPEN 100 UNIT/ML KiwkPen Generic drug:  insulin lispro Inject 5 Units into the skin 3 (three) times daily with  meals as needed (*Only take if blood sugar levels are over 150).   hydroxychloroquine 200 MG tablet Commonly known as:  PLAQUENIL Take 300 mg by mouth as directed. Take 1 1/2 tablets daily   LEVEMIR FLEXTOUCH 100 UNIT/ML Pen Generic drug:  Insulin Detemir Inject 8 Units into the skin every morning.   metoprolol tartrate 50 MG tablet Commonly known as:  LOPRESSOR Take 50 mg by mouth See admin instructions. Take 1 tablet twice a day, except on Tuesday, Thursday, Saturday, take 1 tablet daily in the morning.   oxyCODONE-acetaminophen 5-325 MG tablet Commonly known as:  PERCOCET/ROXICET Take 1 tablet by mouth every 6 (six) hours as needed for severe pain.   predniSONE 5 MG tablet Commonly known as:  DELTASONE Take 5 mg by mouth daily with breakfast.   silver sulfADIAZINE 1 % cream Commonly known as:  SILVADENE Apply 1 application topically daily.   travoprost (benzalkonium) 0.004 % ophthalmic solution Commonly known as:  TRAVATAN Place 1 drop into the left eye at bedtime.       Procedures/Studies: Dg Chest 2 View  Result Date: 03/01/2018 CLINICAL DATA:  Chronic soft tissue wound of the right lower leg. EXAM: CHEST - 2 VIEW COMPARISON:  Chest x-ray dated 12/14/2017 FINDINGS: Slight cardiomegaly. Pulmonary vascularity is normal. Calcification of the thoracic aorta. Double lumen central dialysis catheter in place with the tip at the cavoatrial junction. No infiltrates or effusions.  No acute bone abnormality. IMPRESSION: No acute disease in the chest.  Chronic cardiomegaly. Aortic Atherosclerosis (ICD10-I70.0). Electronically Signed   By: Lorriane Shire  M.D.   On: 03/01/2018 13:57   Dg Tibia/fibula Right  Result Date: 03/01/2018 CLINICAL DATA:  Anterior right lower leg wound. EXAM: RIGHT TIBIA AND FIBULA - 2 VIEW COMPARISON:  12/14/2017 FINDINGS: Old fracture deformity in the distal right fibula with screw in place, stable appearance. No acute fracture, subluxation or dislocation. No radiographic changes of osteomyelitis. Soft tissue ulcer/defect noted laterally over the distal fibular shaft. IMPRESSION: No visible acute bony abnormality. No radiographic changes of osteomyelitis. Electronically Signed   By: Rolm Baptise M.D.   On: 03/01/2018 18:06      Subjective: Pt without complaints, says she does not even want to consider possible amputation of leg due to wounds.  She wants to try working with the wound care center to maximize medical therapy.    Discharge Exam: Vitals:   03/02/18 2050 03/03/18 0636  BP: (!) 178/48 (!) 147/57  Pulse: 78 84  Resp: 18 20  Temp: 98.9 F (37.2 C) 98.3 F (36.8 C)  SpO2: 97% 93%   Vitals:   03/02/18 1520 03/02/18 1530 03/02/18 2050 03/03/18 0636  BP: 140/62 (!) 150/58 (!) 178/48 (!) 147/57  Pulse: 84 77 78 84  Resp:  16 18 20   Temp:  98.1 F (36.7 C) 98.9 F (37.2 C) 98.3 F (36.8 C)  TempSrc:  Oral Oral Oral  SpO2:  97% 97% 93%  Weight:  78.9 kg (173 lb 15.1 oz)    Height:       General: Pt is alert, awake, not in acute distress Cardiovascular: RRR, S1/S2 +, no rubs, no gallops Respiratory: CTA bilaterally, no wheezing, no rhonchi Abdominal: Soft, NT, ND, bowel sounds + Extremities: RLE wound with necrotic tissue and eschar formation c/w calciphylaxis, no s/s of active infection.    The results of significant diagnostics from this hospitalization (including imaging, microbiology, ancillary and laboratory) are listed below for reference.    Microbiology: Recent Results (  from the past 240 hour(s))  MRSA PCR Screening     Status: None   Collection Time: 03/01/18 10:47 PM  Result Value Ref  Range Status   MRSA by PCR NEGATIVE NEGATIVE Final    Comment:        The GeneXpert MRSA Assay (FDA approved for NASAL specimens only), is one component of a comprehensive MRSA colonization surveillance program. It is not intended to diagnose MRSA infection nor to guide or monitor treatment for MRSA infections. Performed at [facility], [address], [place], [place] [address]      Labs: BNP (last 3 results) Recent Labs    11/03/17 0801  BNP 2,694.8*   Basic Metabolic Panel: Recent Labs  Lab 03/01/18 1423 03/02/18 0634  NA 138 137  K 4.3 4.5  CL 95* 98*  CO2 29 27  GLUCOSE 184* 112*  BUN 80* 93*  CREATININE 6.36* 6.60*  CALCIUM 9.6 9.2   Liver Function Tests: Recent Labs  Lab 03/01/18 1423 03/02/18 0634  AST 17 17  ALT 14 12*  ALKPHOS 64 60  BILITOT 0.4 0.4  PROT 7.1 6.5  ALBUMIN 2.6* 2.5*   No results for input(s): LIPASE, AMYLASE in the last 168 hours. No results for input(s): AMMONIA in the last 168 hours. CBC: Recent Labs  Lab 03/01/18 1423 03/02/18 0634  WBC 11.1* 10.4  NEUTROABS 9.6*  --   HGB 10.7* 10.7*  HCT 36.4 36.2  MCV 92.6 91.9  PLT 272 271   Cardiac Enzymes: No results for input(s): CKTOTAL, CKMB, CKMBINDEX, TROPONINI in the last 168 hours. BNP: Invalid input(s): POCBNP CBG: Recent Labs  Lab 03/02/18 1151 03/02/18 1649 03/02/18 2142 03/03/18 0806 03/03/18 1142  GLUCAP 127* 120* 97 80 109*   D-Dimer No results for input(s): DDIMER in the last 72 hours. Hgb A1c Recent Labs    03/01/18 1423  HGBA1C 5.2   Lipid Profile No results for input(s): CHOL, HDL, LDLCALC, TRIG, CHOLHDL, LDLDIRECT in the last 72 hours. Thyroid function studies No results for input(s): TSH, T4TOTAL, T3FREE, THYROIDAB in the last 72 hours.  Invalid input(s): FREET3 Anemia work up No results for input(s): VITAMINB12, FOLATE, FERRITIN, TIBC, IRON, RETICCTPCT in the last 72 hours. Urinalysis    Component Value Date/Time   COLORURINE AMBER  (A) 11/19/2017 0300   APPEARANCEUR HAZY (A) 11/19/2017 0300   LABSPEC 1.021 11/19/2017 0300   PHURINE 5.0 11/19/2017 0300   GLUCOSEU NEGATIVE 11/19/2017 0300   HGBUR NEGATIVE 11/19/2017 0300   BILIRUBINUR NEGATIVE 11/19/2017 0300   KETONESUR NEGATIVE 11/19/2017 0300   PROTEINUR NEGATIVE 11/19/2017 0300   UROBILINOGEN 1.0 03/17/2014 1830   NITRITE NEGATIVE 11/19/2017 0300   LEUKOCYTESUR MODERATE (A) 11/19/2017 0300   Sepsis Labs Invalid input(s): PROCALCITONIN,  WBC,  LACTICIDVEN Microbiology Recent Results (from the past 240 hour(s))  MRSA PCR Screening     Status: None   Collection Time: 03/01/18 10:47 PM  Result Value Ref Range Status   MRSA by PCR NEGATIVE NEGATIVE Final    Comment:        The GeneXpert MRSA Assay (FDA approved for NASAL specimens only), is one component of a comprehensive MRSA colonization surveillance program. It is not intended to diagnose MRSA infection nor to guide or monitor treatment for MRSA infections. Performed at [facility], [address], [place], [place] [address]    Time coordinating discharge:   SIGNED:  Irwin Brakeman, MD  [facility] 03/03/2018, 1:46 PM Pager [phone]  If 7PM-7AM, please  contact night-coverage [email or web address] Password [email or web address]

## 2018-03-03 NOTE — Discharge Instructions (Signed)
Follow up  with outpatient rehab.  Call for an appointment if they do not contact you.  Please go to the wound care center as soon as possible.  Wound Care Instructions: twice daily painting with a betadine swabstick and allowing the area to air-dry. After dry, eschar can be covered with either dry gauze or a single layer of xeroform gauze for continued astringent and antimicrobial effect.  Dry dressings to secure.   Please resume regular hemodialysis schedule tomorrow.    Follow with Primary MD  Sharilyn Sites, MD  and other consultant's as instructed your Hospitalist MD  Please get a complete blood count and chemistry panel checked by your Primary MD at your next visit, and again as instructed by your Primary MD.  Get Medicines reviewed and adjusted: Please take all your medications with you for your next visit with your Primary MD  Laboratory/radiological data: Please request your Primary MD to go over all hospital tests and procedure/radiological results at the follow up, please ask your Primary MD to get all Hospital records sent to his/her office.  In some cases, they will be blood work, cultures and biopsy results pending at the time of your discharge. Please request that your primary care M.D. follows up on these results.  Also Note the following: If you experience worsening of your admission symptoms, develop shortness of breath, life threatening emergency, suicidal or homicidal thoughts you must seek medical attention immediately by calling 911 or calling your MD immediately  if symptoms less severe.  You must read complete instructions/literature along with all the possible adverse reactions/side effects for all the Medicines you take and that have been prescribed to you. Take any new Medicines after you have completely understood and accpet all the possible adverse reactions/side effects.   Do not drive when taking Pain medications or sleeping medications (Benzodaizepines)  Do not  take more than prescribed Pain, Sleep and Anxiety Medications. It is not advisable to combine anxiety,sleep and pain medications without talking with your primary care practitioner  Special Instructions: If you have smoked or chewed Tobacco  in the last 2 yrs please stop smoking, stop any regular Alcohol  and or any Recreational drug use.  Wear Seat belts while driving.  Please note: You were cared for by a hospitalist during your hospital stay. Once you are discharged, your primary care physician will handle any further medical issues. Please note that NO REFILLS for any discharge medications will be authorized once you are discharged, as it is imperative that you return to your primary care physician (or establish a relationship with a primary care physician if you do not have one) for your post hospital discharge needs so that they can reassess your need for medications and monitor your lab values.

## 2018-03-03 NOTE — Progress Notes (Signed)
[name]  MRN: [id number]  DOB/AGE: 03/20/1946 72 y.o.  Primary Care Physician:Golding, Jenny Reichmann, MD  Admit date: 03/01/2018  Chief Complaint:  Chief Complaint  Patient presents with  . Wound Check    S-Pt presented on  03/01/2018 with  Chief Complaint  Patient presents with  . Wound Check  .     Pt says " I am doing better"    Pt offers no new complaints.    Meds . apixaban  5 mg Oral BID  . atorvastatin  20 mg Oral QPM  . brimonidine  1 drop Left Eye Daily   And  . timolol  1 drop Left Eye Daily  . Chlorhexidine Gluconate Cloth  6 each Topical Q0600  . dicyclomine  10 mg Oral TID AC & HS  . febuxostat  40 mg Oral Daily  . feeding supplement (ENSURE ENLIVE)  237 mL Oral BID BM  . folic acid  1 mg Oral Daily  . hydroxychloroquine  300 mg Oral Daily  . insulin aspart  0-9 Units Subcutaneous TID WC  . insulin detemir  8 Units Subcutaneous q morning - 10a  . latanoprost  1 drop Both Eyes QHS  . metoprolol tartrate  50 mg Oral 2 times per day on Sun Mon Wed Fri   And  . metoprolol tartrate  50 mg Oral Once per day on Tue Thu Sat  . predniSONE  5 mg Oral Q breakfast  . sodium chloride flush  3 mL Intravenous Q12H          Physical Exam: Vital signs in last 24 hours: Temp:  [98.1 F (36.7 C)-98.9 F (37.2 C)] 98.3 F (36.8 C) (06/05 0636) Pulse Rate:  [67-85] 84 (06/05 0636) Resp:  [16-20] 20 (06/05 0636) BP: (140-180)/(48-65) 147/57 (06/05 0636) SpO2:  [93 %-99 %] 93 % (06/05 0636) Weight:  [173 lb 15.1 oz (78.9 kg)-179 lb 7.3 oz (81.4 kg)] 173 lb 15.1 oz (78.9 kg) (06/04 1530) Weight change: 7.3 oz (0.206 kg) Last BM Date: 03/01/18  Intake/Output from previous day: 06/04 0701 - 06/05 0700 In: 240 [P.O.:240] Out: 2500  No intake/output data recorded.   Physical Exam: General- pt is awake,alert, oriented to time place and person Resp- No acute REsp distress,  No Rhonchi CVS- S1S2 regular ij rate and rhythm GIT- BS+, soft, NT, ND EXT- NO LE  Edema, Cyanosis Access- left AVF not matured for use PC in situ being used  Lab Results: CBC Recent Labs    03/01/18 1423 03/02/18 0634  WBC 11.1* 10.4  HGB 10.7* 10.7*  HCT 36.4 36.2  PLT 272 271    BMET Recent Labs    03/01/18 1423 03/02/18 0634  NA 138 137  K 4.3 4.5  CL 95* 98*  CO2 29 27  GLUCOSE 184* 112*  BUN 80* 93*  CREATININE 6.36* 6.60*  CALCIUM 9.6 9.2    Trend  Creat 11/09/17-HD initiated 2019  2.3=> 4.0=>4.4 2018    2.4--2.7 2017   2.2--2.6 2016   2.1--2.2 2015   2.2--3.2 2014   1.2--1.4 2013   1.1--1.6   KIDNEY, NEEDLE BIOPSY:done in June 2015 COLLAPSING VARIANT OF FOCAL AND SEGMENTAL GLOMERULOSCLEROSIS IN ASSOCIATION WITH DIABETIC GLOMERULOSCLEROSIS. SEVERE ARTERIOSCLEROSIS WITH MODERATE TO SEVERE TUBULOINTERSTITIAL SCARRING ANDSUPERIMPOSED ACUTE TUBULAR INJURY.    MICRO Recent Results (from the past 240 hour(s))  MRSA PCR Screening     Status: None   Collection Time: 03/01/18 10:47 PM  Result Value Ref Range Status  MRSA by PCR NEGATIVE NEGATIVE Final    Comment:        The GeneXpert MRSA Assay (FDA approved for NASAL specimens only), is one component of a comprehensive MRSA colonization surveillance program. It is not intended to diagnose MRSA infection nor to guide or monitor treatment for MRSA infections. 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                HD initiated 11/09/17                PC placed 11/09/17                Pt is on Tuesday/Thursday/Saturday schedule                 Pt was  last dialyzed yesterday   2)HTN  Medication- On Beta blockers   3)Anemia HGb at goal   4)CKD Mineral-Bone Disorder PTH elevated . Secondary Hyperparathyroidism  present. Phosphorus not at goal.   5)ID- admited with chronic wound  Primary MD following  6)Electrolytes  Normokalemic  normonatremic  7)Acid base Co2 at goal     Plan:   Will continue current care No need for Hd today Will dialyze in am.    BHUTANI,MANPREET S 03/03/2018, 9:12 AM

## 2018-03-04 DIAGNOSIS — N186 End stage renal disease: Secondary | ICD-10-CM | POA: Diagnosis not present

## 2018-03-04 DIAGNOSIS — D689 Coagulation defect, unspecified: Secondary | ICD-10-CM | POA: Diagnosis not present

## 2018-03-04 DIAGNOSIS — N2581 Secondary hyperparathyroidism of renal origin: Secondary | ICD-10-CM | POA: Diagnosis not present

## 2018-03-04 DIAGNOSIS — E876 Hypokalemia: Secondary | ICD-10-CM | POA: Diagnosis not present

## 2018-03-04 DIAGNOSIS — E1129 Type 2 diabetes mellitus with other diabetic kidney complication: Secondary | ICD-10-CM | POA: Diagnosis not present

## 2018-03-04 DIAGNOSIS — D509 Iron deficiency anemia, unspecified: Secondary | ICD-10-CM | POA: Diagnosis not present

## 2018-03-04 DIAGNOSIS — D631 Anemia in chronic kidney disease: Secondary | ICD-10-CM | POA: Diagnosis not present

## 2018-03-05 DIAGNOSIS — I4891 Unspecified atrial fibrillation: Secondary | ICD-10-CM | POA: Diagnosis not present

## 2018-03-05 DIAGNOSIS — L03115 Cellulitis of right lower limb: Secondary | ICD-10-CM | POA: Diagnosis not present

## 2018-03-05 DIAGNOSIS — Z87891 Personal history of nicotine dependence: Secondary | ICD-10-CM | POA: Diagnosis not present

## 2018-03-05 DIAGNOSIS — Z794 Long term (current) use of insulin: Secondary | ICD-10-CM | POA: Diagnosis not present

## 2018-03-05 DIAGNOSIS — M329 Systemic lupus erythematosus, unspecified: Secondary | ICD-10-CM | POA: Diagnosis not present

## 2018-03-05 DIAGNOSIS — M150 Primary generalized (osteo)arthritis: Secondary | ICD-10-CM | POA: Diagnosis not present

## 2018-03-05 DIAGNOSIS — N186 End stage renal disease: Secondary | ICD-10-CM | POA: Diagnosis not present

## 2018-03-05 DIAGNOSIS — H409 Unspecified glaucoma: Secondary | ICD-10-CM | POA: Diagnosis not present

## 2018-03-05 DIAGNOSIS — J449 Chronic obstructive pulmonary disease, unspecified: Secondary | ICD-10-CM | POA: Diagnosis not present

## 2018-03-05 DIAGNOSIS — E1122 Type 2 diabetes mellitus with diabetic chronic kidney disease: Secondary | ICD-10-CM | POA: Diagnosis not present

## 2018-03-05 DIAGNOSIS — Z992 Dependence on renal dialysis: Secondary | ICD-10-CM | POA: Diagnosis not present

## 2018-03-05 DIAGNOSIS — E785 Hyperlipidemia, unspecified: Secondary | ICD-10-CM | POA: Diagnosis not present

## 2018-03-05 DIAGNOSIS — M109 Gout, unspecified: Secondary | ICD-10-CM | POA: Diagnosis not present

## 2018-03-05 DIAGNOSIS — I872 Venous insufficiency (chronic) (peripheral): Secondary | ICD-10-CM | POA: Diagnosis not present

## 2018-03-06 DIAGNOSIS — I509 Heart failure, unspecified: Secondary | ICD-10-CM | POA: Diagnosis not present

## 2018-03-06 DIAGNOSIS — N2581 Secondary hyperparathyroidism of renal origin: Secondary | ICD-10-CM | POA: Diagnosis not present

## 2018-03-06 DIAGNOSIS — J449 Chronic obstructive pulmonary disease, unspecified: Secondary | ICD-10-CM | POA: Diagnosis not present

## 2018-03-06 DIAGNOSIS — D509 Iron deficiency anemia, unspecified: Secondary | ICD-10-CM | POA: Diagnosis not present

## 2018-03-06 DIAGNOSIS — N186 End stage renal disease: Secondary | ICD-10-CM | POA: Diagnosis not present

## 2018-03-06 DIAGNOSIS — M1990 Unspecified osteoarthritis, unspecified site: Secondary | ICD-10-CM | POA: Diagnosis not present

## 2018-03-06 DIAGNOSIS — E1129 Type 2 diabetes mellitus with other diabetic kidney complication: Secondary | ICD-10-CM | POA: Diagnosis not present

## 2018-03-06 DIAGNOSIS — E876 Hypokalemia: Secondary | ICD-10-CM | POA: Diagnosis not present

## 2018-03-06 DIAGNOSIS — D631 Anemia in chronic kidney disease: Secondary | ICD-10-CM | POA: Diagnosis not present

## 2018-03-06 DIAGNOSIS — D689 Coagulation defect, unspecified: Secondary | ICD-10-CM | POA: Diagnosis not present

## 2018-03-08 DIAGNOSIS — E1122 Type 2 diabetes mellitus with diabetic chronic kidney disease: Secondary | ICD-10-CM | POA: Diagnosis not present

## 2018-03-08 DIAGNOSIS — I4891 Unspecified atrial fibrillation: Secondary | ICD-10-CM | POA: Diagnosis not present

## 2018-03-08 DIAGNOSIS — N186 End stage renal disease: Secondary | ICD-10-CM | POA: Diagnosis not present

## 2018-03-08 DIAGNOSIS — M329 Systemic lupus erythematosus, unspecified: Secondary | ICD-10-CM | POA: Diagnosis not present

## 2018-03-08 DIAGNOSIS — M150 Primary generalized (osteo)arthritis: Secondary | ICD-10-CM | POA: Diagnosis not present

## 2018-03-08 DIAGNOSIS — Z794 Long term (current) use of insulin: Secondary | ICD-10-CM | POA: Diagnosis not present

## 2018-03-08 DIAGNOSIS — L03115 Cellulitis of right lower limb: Secondary | ICD-10-CM | POA: Diagnosis not present

## 2018-03-08 DIAGNOSIS — H409 Unspecified glaucoma: Secondary | ICD-10-CM | POA: Diagnosis not present

## 2018-03-08 DIAGNOSIS — I872 Venous insufficiency (chronic) (peripheral): Secondary | ICD-10-CM | POA: Diagnosis not present

## 2018-03-08 DIAGNOSIS — Z87891 Personal history of nicotine dependence: Secondary | ICD-10-CM | POA: Diagnosis not present

## 2018-03-08 DIAGNOSIS — L97212 Non-pressure chronic ulcer of right calf with fat layer exposed: Secondary | ICD-10-CM | POA: Diagnosis not present

## 2018-03-08 DIAGNOSIS — G894 Chronic pain syndrome: Secondary | ICD-10-CM | POA: Diagnosis not present

## 2018-03-08 DIAGNOSIS — M109 Gout, unspecified: Secondary | ICD-10-CM | POA: Diagnosis not present

## 2018-03-08 DIAGNOSIS — E785 Hyperlipidemia, unspecified: Secondary | ICD-10-CM | POA: Diagnosis not present

## 2018-03-08 DIAGNOSIS — Z992 Dependence on renal dialysis: Secondary | ICD-10-CM | POA: Diagnosis not present

## 2018-03-08 DIAGNOSIS — I10 Essential (primary) hypertension: Secondary | ICD-10-CM | POA: Diagnosis not present

## 2018-03-08 DIAGNOSIS — J449 Chronic obstructive pulmonary disease, unspecified: Secondary | ICD-10-CM | POA: Diagnosis not present

## 2018-03-09 DIAGNOSIS — D509 Iron deficiency anemia, unspecified: Secondary | ICD-10-CM | POA: Diagnosis not present

## 2018-03-09 DIAGNOSIS — E1129 Type 2 diabetes mellitus with other diabetic kidney complication: Secondary | ICD-10-CM | POA: Diagnosis not present

## 2018-03-09 DIAGNOSIS — D689 Coagulation defect, unspecified: Secondary | ICD-10-CM | POA: Diagnosis not present

## 2018-03-09 DIAGNOSIS — N186 End stage renal disease: Secondary | ICD-10-CM | POA: Diagnosis not present

## 2018-03-09 DIAGNOSIS — D631 Anemia in chronic kidney disease: Secondary | ICD-10-CM | POA: Diagnosis not present

## 2018-03-09 DIAGNOSIS — E876 Hypokalemia: Secondary | ICD-10-CM | POA: Diagnosis not present

## 2018-03-09 DIAGNOSIS — N2581 Secondary hyperparathyroidism of renal origin: Secondary | ICD-10-CM | POA: Diagnosis not present

## 2018-03-10 ENCOUNTER — Other Ambulatory Visit: Payer: Self-pay | Admitting: *Deleted

## 2018-03-10 ENCOUNTER — Ambulatory Visit (INDEPENDENT_AMBULATORY_CARE_PROVIDER_SITE_OTHER): Payer: Self-pay | Admitting: Vascular Surgery

## 2018-03-10 ENCOUNTER — Encounter: Payer: Self-pay | Admitting: Vascular Surgery

## 2018-03-10 ENCOUNTER — Encounter: Payer: Self-pay | Admitting: *Deleted

## 2018-03-10 ENCOUNTER — Encounter: Payer: Medicare Other | Attending: Internal Medicine | Admitting: Internal Medicine

## 2018-03-10 VITALS — BP 160/58 | HR 66 | Temp 97.5°F | Resp 20 | Ht 64.0 in | Wt 173.9 lb

## 2018-03-10 DIAGNOSIS — Z794 Long term (current) use of insulin: Secondary | ICD-10-CM | POA: Diagnosis not present

## 2018-03-10 DIAGNOSIS — S91301A Unspecified open wound, right foot, initial encounter: Secondary | ICD-10-CM | POA: Diagnosis not present

## 2018-03-10 DIAGNOSIS — Z48812 Encounter for surgical aftercare following surgery on the circulatory system: Secondary | ICD-10-CM

## 2018-03-10 DIAGNOSIS — L89610 Pressure ulcer of right heel, unstageable: Secondary | ICD-10-CM | POA: Diagnosis not present

## 2018-03-10 DIAGNOSIS — E11622 Type 2 diabetes mellitus with other skin ulcer: Secondary | ICD-10-CM | POA: Diagnosis not present

## 2018-03-10 DIAGNOSIS — N186 End stage renal disease: Secondary | ICD-10-CM | POA: Insufficient documentation

## 2018-03-10 DIAGNOSIS — I120 Hypertensive chronic kidney disease with stage 5 chronic kidney disease or end stage renal disease: Secondary | ICD-10-CM | POA: Diagnosis not present

## 2018-03-10 DIAGNOSIS — L97812 Non-pressure chronic ulcer of other part of right lower leg with fat layer exposed: Secondary | ICD-10-CM | POA: Diagnosis not present

## 2018-03-10 DIAGNOSIS — L97212 Non-pressure chronic ulcer of right calf with fat layer exposed: Secondary | ICD-10-CM | POA: Diagnosis not present

## 2018-03-10 DIAGNOSIS — Z992 Dependence on renal dialysis: Secondary | ICD-10-CM | POA: Diagnosis not present

## 2018-03-10 DIAGNOSIS — M329 Systemic lupus erythematosus, unspecified: Secondary | ICD-10-CM | POA: Diagnosis not present

## 2018-03-10 DIAGNOSIS — M793 Panniculitis, unspecified: Secondary | ICD-10-CM | POA: Diagnosis not present

## 2018-03-10 DIAGNOSIS — E1122 Type 2 diabetes mellitus with diabetic chronic kidney disease: Secondary | ICD-10-CM | POA: Insufficient documentation

## 2018-03-10 NOTE — H&P (View-Only) (Signed)
Patient name: [name] MRN: [id number] DOB: [birth year] Sex: female  REASON FOR VISIT:   Follow-up of right calf wound.  HPI:   [name] is a pleasant 72 y.o. female who was sent to evaluate for a wound over her left upper arm basilic vein transposition.  She dialyzes on Tuesdays Thursdays and Saturdays.  She has a functioning right IJ tunneled dialysis catheter.  She is on Eliquis.  The patient had undergone a second stage basilic vein transposition on 01/13/2018 by Dr. [name].  I saw the patient on 01/16/2018 and she had some swelling in the left upper arm and there was a concern for infection.  On exam she had swelling with significant ecchymosis suggesting some bleeding likely related to her anticoagulation.  There was really no area that could be drained and no evidence of active bleeding so I recommended leg elevation and she was to follow-up in the office with Dr. Oneida Alar.  Because of her dialysis schedule she comes in today.  Her swelling has significantly improved.  She has no specific complaints.  Current Outpatient Medications  Medication Sig Dispense Refill  . acetaminophen (TYLENOL) 500 MG tablet Take 1,000 mg by mouth every 6 (six) hours as needed. For pain    . albuterol (ACCUNEB) 0.63 MG/3ML nebulizer solution Take 1 ampule by nebulization every 6 (six) hours as needed for wheezing or shortness of breath.     Marland Kitchen albuterol (PROVENTIL HFA;VENTOLIN HFA) 108 (90 BASE) MCG/ACT inhaler Inhale 2 puffs into the lungs every 6 (six) hours as needed. For shortness of breath    . Amino Acids-Protein Hydrolys (FEEDING SUPPLEMENT, PRO-STAT SUGAR FREE 64,) LIQD Take 30 mLs by mouth 2 (two) times daily. (Patient taking differently: Take 30 mLs by mouth 3 (three) times daily with meals. ) 900 mL 0  . apixaban (ELIQUIS) 5 MG TABS tablet Take 1 tablet (5 mg total) by mouth 2 (two) times daily. 60 tablet 6  . atorvastatin (LIPITOR) 20 MG tablet Take 20 mg by mouth every  evening.     . brimonidine-timolol (COMBIGAN) 0.2-0.5 % ophthalmic solution Place 1 drop into the left eye daily.     . calcium acetate (PHOSLO) 667 MG capsule Take 667 mg by mouth 3 (three) times daily with meals.    . diclofenac (VOLTAREN) 0.1 % ophthalmic solution Place 1 drop into the left eye daily. Uses at lunch time    . dicyclomine (BENTYL) 10 MG capsule Take 10 mg by mouth 4 (four) times daily -  before meals and at bedtime.    . febuxostat (ULORIC) 40 MG tablet Take 40 mg by mouth daily.    . folic acid (FOLVITE) 1 MG tablet Take 1 mg by mouth daily.    . hydroxychloroquine (PLAQUENIL) 200 MG tablet Take 300 mg by mouth as directed. Take 1 1/2 tablets daily    . Insulin Detemir (LEVEMIR FLEXTOUCH) 100 UNIT/ML Pen Inject 8 Units into the skin every morning.     . insulin lispro (HUMALOG KWIKPEN) 100 UNIT/ML KiwkPen Inject 5 Units into the skin 3 (three) times daily with meals as needed (*Only take if blood sugar levels are over 150).     . metoprolol tartrate (LOPRESSOR) 50 MG tablet Take 50 mg by mouth See admin instructions. Take 1 tablet twice a day, except on Tuesday, Thursday, Saturday, take 1 tablet daily in the morning.    . Multiple Vitamins-Minerals (CENTRUM SILVER PO) Take 1 tablet by mouth daily.    Marland Kitchen  oxyCODONE-acetaminophen (PERCOCET/ROXICET) 5-325 MG tablet Take 1 tablet by mouth every 6 (six) hours as needed for severe pain. 20 tablet 0  . predniSONE (DELTASONE) 5 MG tablet Take 5 mg by mouth daily with breakfast.    . silver sulfADIAZINE (SILVADENE) 1 % cream Apply 1 application topically daily.    . travoprost, benzalkonium, (TRAVATAN) 0.004 % ophthalmic solution Place 1 drop into the left eye at bedtime.     No current facility-administered medications for this visit.     REVIEW OF SYSTEMS:  [X]  denotes positive finding, [ ]  denotes negative finding Cardiac  Comments:  Chest pain or chest pressure:    Shortness of breath upon exertion:    Short of breath when lying  flat:    Irregular heart rhythm:    Constitutional    Fever or chills:     PHYSICAL EXAM:   Vitals:   03/10/18 1605 03/10/18 1608  BP: (!) 162/64 (!) 160/58  Pulse: 66   Resp: 20   Temp: (!) 97.5 F (36.4 C)   TempSrc: Oral   SpO2: 93%   Weight: 173 lb 14.4 oz (78.9 kg)   Height: 5\' 4"  (1.626 m)     GENERAL: The patient is a well-nourished female, in no acute distress. The vital signs are documented above. CARDIOVASCULAR: There is a regular rate and rhythm. PULMONARY: There is good air exchange bilaterally without wheezing or rales. The swelling in her left arm has significantly improved.  However there is one area of this about a 8 mm in diameter over the central portion of her fistula where the skin is somewhat thin.  Beneath this area and the vein.  There is not a lot of soft tissue  DATA:   No new data.  MEDICAL ISSUES:   STATUS POST LEFT BASILIC VEIN TRANSPOSITION: I have recommended that the area of concern be addressed by excising this area and closing some soft tissue over the vein.  We will need to stop her Xarelto.  She dialyzes on Tuesdays Thursdays and Saturdays and will schedule this on Monday.  I have discussed the procedure and potential complications and she is agreeable to proceed.  [name] [facility] [phone]

## 2018-03-10 NOTE — Progress Notes (Signed)
Patient name: [name] MRN: [id number] DOB: January 07, 1946 Sex: female  REASON FOR VISIT:   Follow-up of right calf wound.  HPI:   [name] is a pleasant 72 y.o. female who was sent to evaluate for a wound over her left upper arm basilic vein transposition.  She dialyzes on Tuesdays Thursdays and Saturdays.  She has a functioning right IJ tunneled dialysis catheter.  She is on Eliquis.  The patient had undergone a second stage basilic vein transposition on 01/13/2018 by Dr. [name].  I saw the patient on 01/16/2018 and she had some swelling in the left upper arm and there was a concern for infection.  On exam she had swelling with significant ecchymosis suggesting some bleeding likely related to her anticoagulation.  There was really no area that could be drained and no evidence of active bleeding so I recommended leg elevation and she was to follow-up in the office with Dr. Oneida Alar.  Because of her dialysis schedule she comes in today.  Her swelling has significantly improved.  She has no specific complaints.  Current Outpatient Medications  Medication Sig Dispense Refill  . acetaminophen (TYLENOL) 500 MG tablet Take 1,000 mg by mouth every 6 (six) hours as needed. For pain    . albuterol (ACCUNEB) 0.63 MG/3ML nebulizer solution Take 1 ampule by nebulization every 6 (six) hours as needed for wheezing or shortness of breath.     Marland Kitchen albuterol (PROVENTIL HFA;VENTOLIN HFA) 108 (90 BASE) MCG/ACT inhaler Inhale 2 puffs into the lungs every 6 (six) hours as needed. For shortness of breath    . Amino Acids-Protein Hydrolys (FEEDING SUPPLEMENT, PRO-STAT SUGAR FREE 64,) LIQD Take 30 mLs by mouth 2 (two) times daily. (Patient taking differently: Take 30 mLs by mouth 3 (three) times daily with meals. ) 900 mL 0  . apixaban (ELIQUIS) 5 MG TABS tablet Take 1 tablet (5 mg total) by mouth 2 (two) times daily. 60 tablet 6  . atorvastatin (LIPITOR) 20 MG tablet Take 20 mg by mouth every  evening.     . brimonidine-timolol (COMBIGAN) 0.2-0.5 % ophthalmic solution Place 1 drop into the left eye daily.     . calcium acetate (PHOSLO) 667 MG capsule Take 667 mg by mouth 3 (three) times daily with meals.    . diclofenac (VOLTAREN) 0.1 % ophthalmic solution Place 1 drop into the left eye daily. Uses at lunch time    . dicyclomine (BENTYL) 10 MG capsule Take 10 mg by mouth 4 (four) times daily -  before meals and at bedtime.    . febuxostat (ULORIC) 40 MG tablet Take 40 mg by mouth daily.    . folic acid (FOLVITE) 1 MG tablet Take 1 mg by mouth daily.    . hydroxychloroquine (PLAQUENIL) 200 MG tablet Take 300 mg by mouth as directed. Take 1 1/2 tablets daily    . Insulin Detemir (LEVEMIR FLEXTOUCH) 100 UNIT/ML Pen Inject 8 Units into the skin every morning.     . insulin lispro (HUMALOG KWIKPEN) 100 UNIT/ML KiwkPen Inject 5 Units into the skin 3 (three) times daily with meals as needed (*Only take if blood sugar levels are over 150).     . metoprolol tartrate (LOPRESSOR) 50 MG tablet Take 50 mg by mouth See admin instructions. Take 1 tablet twice a day, except on Tuesday, Thursday, Saturday, take 1 tablet daily in the morning.    . Multiple Vitamins-Minerals (CENTRUM SILVER PO) Take 1 tablet by mouth daily.    Marland Kitchen  oxyCODONE-acetaminophen (PERCOCET/ROXICET) 5-325 MG tablet Take 1 tablet by mouth every 6 (six) hours as needed for severe pain. 20 tablet 0  . predniSONE (DELTASONE) 5 MG tablet Take 5 mg by mouth daily with breakfast.    . silver sulfADIAZINE (SILVADENE) 1 % cream Apply 1 application topically daily.    . travoprost, benzalkonium, (TRAVATAN) 0.004 % ophthalmic solution Place 1 drop into the left eye at bedtime.     No current facility-administered medications for this visit.     REVIEW OF SYSTEMS:  [X]  denotes positive finding, [ ]  denotes negative finding Cardiac  Comments:  Chest pain or chest pressure:    Shortness of breath upon exertion:    Short of breath when lying  flat:    Irregular heart rhythm:    Constitutional    Fever or chills:     PHYSICAL EXAM:   Vitals:   03/10/18 1605 03/10/18 1608  BP: (!) 162/64 (!) 160/58  Pulse: 66   Resp: 20   Temp: (!) 97.5 F (36.4 C)   TempSrc: Oral   SpO2: 93%   Weight: 173 lb 14.4 oz (78.9 kg)   Height: 5\' 4"  (1.626 m)     GENERAL: The patient is a well-nourished female, in no acute distress. The vital signs are documented above. CARDIOVASCULAR: There is a regular rate and rhythm. PULMONARY: There is good air exchange bilaterally without wheezing or rales. The swelling in her left arm has significantly improved.  However there is one area of this about a 8 mm in diameter over the central portion of her fistula where the skin is somewhat thin.  Beneath this area and the vein.  There is not a lot of soft tissue  DATA:   No new data.  MEDICAL ISSUES:   STATUS POST LEFT BASILIC VEIN TRANSPOSITION: I have recommended that the area of concern be addressed by excising this area and closing some soft tissue over the vein.  We will need to stop her Xarelto.  She dialyzes on Tuesdays Thursdays and Saturdays and will schedule this on Monday.  I have discussed the procedure and potential complications and she is agreeable to proceed.  [name] [facility] [phone]

## 2018-03-11 DIAGNOSIS — E1129 Type 2 diabetes mellitus with other diabetic kidney complication: Secondary | ICD-10-CM | POA: Diagnosis not present

## 2018-03-11 DIAGNOSIS — D631 Anemia in chronic kidney disease: Secondary | ICD-10-CM | POA: Diagnosis not present

## 2018-03-11 DIAGNOSIS — D509 Iron deficiency anemia, unspecified: Secondary | ICD-10-CM | POA: Diagnosis not present

## 2018-03-11 DIAGNOSIS — D689 Coagulation defect, unspecified: Secondary | ICD-10-CM | POA: Diagnosis not present

## 2018-03-11 DIAGNOSIS — N2581 Secondary hyperparathyroidism of renal origin: Secondary | ICD-10-CM | POA: Diagnosis not present

## 2018-03-11 DIAGNOSIS — E876 Hypokalemia: Secondary | ICD-10-CM | POA: Diagnosis not present

## 2018-03-11 DIAGNOSIS — N186 End stage renal disease: Secondary | ICD-10-CM | POA: Diagnosis not present

## 2018-03-12 ENCOUNTER — Encounter (HOSPITAL_COMMUNITY): Payer: Self-pay | Admitting: Orthopedic Surgery

## 2018-03-12 DIAGNOSIS — J449 Chronic obstructive pulmonary disease, unspecified: Secondary | ICD-10-CM | POA: Diagnosis not present

## 2018-03-12 DIAGNOSIS — N186 End stage renal disease: Secondary | ICD-10-CM | POA: Diagnosis not present

## 2018-03-12 DIAGNOSIS — M109 Gout, unspecified: Secondary | ICD-10-CM | POA: Diagnosis not present

## 2018-03-12 DIAGNOSIS — E785 Hyperlipidemia, unspecified: Secondary | ICD-10-CM | POA: Diagnosis not present

## 2018-03-12 DIAGNOSIS — H409 Unspecified glaucoma: Secondary | ICD-10-CM | POA: Diagnosis not present

## 2018-03-12 DIAGNOSIS — L03115 Cellulitis of right lower limb: Secondary | ICD-10-CM | POA: Diagnosis not present

## 2018-03-12 DIAGNOSIS — I872 Venous insufficiency (chronic) (peripheral): Secondary | ICD-10-CM | POA: Diagnosis not present

## 2018-03-12 DIAGNOSIS — E1122 Type 2 diabetes mellitus with diabetic chronic kidney disease: Secondary | ICD-10-CM | POA: Diagnosis not present

## 2018-03-12 DIAGNOSIS — M150 Primary generalized (osteo)arthritis: Secondary | ICD-10-CM | POA: Diagnosis not present

## 2018-03-12 DIAGNOSIS — Z794 Long term (current) use of insulin: Secondary | ICD-10-CM | POA: Diagnosis not present

## 2018-03-12 DIAGNOSIS — Z87891 Personal history of nicotine dependence: Secondary | ICD-10-CM | POA: Diagnosis not present

## 2018-03-12 DIAGNOSIS — M329 Systemic lupus erythematosus, unspecified: Secondary | ICD-10-CM | POA: Diagnosis not present

## 2018-03-12 DIAGNOSIS — Z992 Dependence on renal dialysis: Secondary | ICD-10-CM | POA: Diagnosis not present

## 2018-03-12 DIAGNOSIS — I4891 Unspecified atrial fibrillation: Secondary | ICD-10-CM | POA: Diagnosis not present

## 2018-03-12 NOTE — Progress Notes (Signed)
RANELLE, AUKER ([id number]) Visit Report for 03/10/2018 Abuse/Suicide Risk Screen Details Patient Name: EMMALISE, HUARD. Date of Service: 03/10/2018 12:30 PM Medical Record Number: [id number] Patient Account Number: [id number] Date of Birth/Sex: August 26, 1946 (72 y.o. Female) Treating RN: Ahmed Prima Primary Care Bobbi Yount: Sharilyn Sites Other Clinician: Referring Jourden Gilson: Eleonore Chiquito Treating Sharlene Mccluskey/Extender: Tito Dine in Treatment: 0 Abuse/Suicide Risk Screen Items Answer ABUSE/SUICIDE RISK SCREEN: Has anyone close to you tried to hurt or harm you recentlyo No Do you feel uncomfortable with anyone in your familyo No Has anyone forced you do things that you didnot want to doo No Do you have any thoughts of harming yourselfo No Patient displays signs or symptoms of abuse and/or neglect. No Electronic Signature(s) Signed: 03/10/2018 3:54:16 PM By: Alric Quan Entered By: Alric Quan on 03/10/2018 13:03:38 [name] ([id number]) -------------------------------------------------------------------------------- Activities of Daily Living Details Patient Name: DARTHA, ROZZELL. Date of Service: 03/10/2018 12:30 PM Medical Record Number: [id number] Patient Account Number: [id number] Date of Birth/Sex: Apr 19, 1946 (72 y.o. Female) Treating RN: Ahmed Prima Primary Care Rudine Rieger: Sharilyn Sites Other Clinician: Referring Khup Sapia: Eleonore Chiquito Treating Makenzey Nanni/Extender: Tito Dine in Treatment: 0 Activities of Daily Living Items Answer Activities of Daily Living (Please select one for each item) Drive Automobile Not Able Take Medications Need Assistance Use Telephone Completely Able Care for Appearance Completely Able Use Toilet Completely Able Bath / Shower Completely Able Dress Self Completely Able Feed Self Completely Able Walk Need Assistance Get In / Out Bed Need Assistance Housework Not Able Prepare Meals Not  Able Handle Money Not Able Shop for Self Not Able Electronic Signature(s) Signed: 03/10/2018 3:54:16 PM By: Alric Quan Entered By: Alric Quan on 03/10/2018 13:04:21 [name] ([id number]) -------------------------------------------------------------------------------- Education Assessment Details Patient Name: [name]. Date of Service: 03/10/2018 12:30 PM Medical Record Number: [id number] Patient Account Number: [id number] Date of Birth/Sex: [birth year] (72 y.o. Female) Treating RN: Carolyne Fiscal, Debi Primary Care Nila Winker: Sharilyn Sites Other Clinician: Referring Dereke Neumann: Eleonore Chiquito Treating Lakoda Mcanany/Extender: Tito Dine in Treatment: 0 Primary Learner Assessed: Patient Learning Preferences/Education Level/Primary Language Learning Preference: Explanation, Printed Material Highest Education Level: Grade School Preferred Language: English Cognitive Barrier Assessment/Beliefs Language Barrier: No Translator Needed: No Memory Deficit: No Emotional Barrier: No Physical Barrier Assessment Impaired Vision: No Impaired Hearing: No Decreased Hand dexterity: No Knowledge/Comprehension Assessment Knowledge Level: Medium Comprehension Level: Medium Ability to understand written Medium instructions: Ability to understand verbal Medium instructions: Motivation Assessment Anxiety Level: Calm Cooperation: Cooperative Education Importance: Acknowledges Need Interest in Health Problems: Asks Questions Perception: Coherent Willingness to Engage in Self- Medium Management Activities: Readiness to Engage in Self- Medium Management Activities: Electronic Signature(s) Signed: 03/10/2018 3:54:16 PM By: Alric Quan Entered By: Alric Quan on 03/10/2018 13:04:43 [name] ([id number]) -------------------------------------------------------------------------------- Fall Risk Assessment Details Patient Name: [name]. Date of Service: 03/10/2018 12:30 PM Medical Record Number: [id number] Patient Account Number: [id number] Date of Birth/Sex: [birth year] (72 y.o. Female) Treating RN: Carolyne Fiscal, Debi Primary Care Twilia Yaklin: Sharilyn Sites Other Clinician: Referring Elaijah Munoz: Eleonore Chiquito Treating Lethia Donlon/Extender: Tito Dine in Treatment: 0 Fall Risk Assessment Items Have you had 2 or more falls in the last 12 monthso 0 Yes Have you had any fall that resulted in injury in the last 12 monthso 0 No FALL RISK ASSESSMENT: History of falling - immediate or within 3 months 0 No Secondary diagnosis 15 Yes Ambulatory aid None/bed rest/wheelchair/nurse 0 Yes Crutches/cane/walker 15 Yes Furniture 0 No IV Access/Saline Lock 0  No Gait/Training Normal/bed rest/immobile 0 No Weak 10 Yes Impaired 0 No Mental Status Oriented to own ability 0 Yes Electronic Signature(s) Signed: 03/10/2018 3:54:16 PM By: Alric Quan Entered By: Alric Quan on 03/10/2018 13:05:14 [name] ([id number]) -------------------------------------------------------------------------------- Foot Assessment Details Patient Name: [name]. Date of Service: 03/10/2018 12:30 PM Medical Record Number: [id number] Patient Account Number: [id number] Date of Birth/Sex: April 06, 1946 (72 y.o. Female) Treating RN: Ahmed Prima Primary Care Bart Ashford: Sharilyn Sites Other Clinician: Referring Eboni Coval: Eleonore Chiquito Treating Monzerrat Wellen/Extender: Tito Dine in Treatment: 0 Foot Assessment Items Site Locations + = Sensation present, - = Sensation absent, C = Callus, U = Ulcer R = Redness, W = Warmth, M = Maceration, PU = Pre-ulcerative lesion F = Fissure, S = Swelling, D = Dryness Assessment Right: Left: Other Deformity: No No Prior Foot Ulcer: No No Prior Amputation: No No Charcot Joint: No No Ambulatory Status: Ambulatory Without Help Gait: Steady Electronic Signature(s) Signed:  03/10/2018 3:54:16 PM By: Alric Quan Entered By: Alric Quan on 03/10/2018 13:09:49 [name] ([id number]) -------------------------------------------------------------------------------- Nutrition Risk Assessment Details Patient Name: [name]. Date of Service: 03/10/2018 12:30 PM Medical Record Number: [id number] Patient Account Number: [id number] Date of Birth/Sex: [birth year] (72 y.o. Female) Treating RN: Carolyne Fiscal, Debi Primary Care Lamar Meter: Sharilyn Sites Other Clinician: Referring Joeann Steppe: Eleonore Chiquito Treating Avyn Coate/Extender: Tito Dine in Treatment: 0 Height (in): 65 Weight (lbs): 164 Body Mass Index (BMI): 27.3 Nutrition Risk Assessment Items NUTRITION RISK SCREEN: I have an illness or condition that made me change the kind and/or amount of 2 Yes food I eat I eat fewer than two meals per day 0 No I eat few fruits and vegetables, or milk products 0 No I have three or more drinks of beer, liquor or wine almost every day 0 No I have tooth or mouth problems that make it hard for me to eat 0 No I don't always have enough money to buy the food I need 0 No I eat alone most of the time 0 No I take three or more different prescribed or over-the-counter drugs a day 1 Yes Without wanting to, I have lost or gained 10 pounds in the last six months 0 No I am not always physically able to shop, cook and/or feed myself 0 No Nutrition Protocols Good Risk Protocol Moderate Risk Protocol Electronic Signature(s) Signed: 03/10/2018 3:54:16 PM By: Alric Quan Entered By: Alric Quan on 03/10/2018 13:05:33

## 2018-03-13 DIAGNOSIS — D509 Iron deficiency anemia, unspecified: Secondary | ICD-10-CM | POA: Diagnosis not present

## 2018-03-13 DIAGNOSIS — N2581 Secondary hyperparathyroidism of renal origin: Secondary | ICD-10-CM | POA: Diagnosis not present

## 2018-03-13 DIAGNOSIS — D631 Anemia in chronic kidney disease: Secondary | ICD-10-CM | POA: Diagnosis not present

## 2018-03-13 DIAGNOSIS — E876 Hypokalemia: Secondary | ICD-10-CM | POA: Diagnosis not present

## 2018-03-13 DIAGNOSIS — E1129 Type 2 diabetes mellitus with other diabetic kidney complication: Secondary | ICD-10-CM | POA: Diagnosis not present

## 2018-03-13 DIAGNOSIS — D689 Coagulation defect, unspecified: Secondary | ICD-10-CM | POA: Diagnosis not present

## 2018-03-13 DIAGNOSIS — N186 End stage renal disease: Secondary | ICD-10-CM | POA: Diagnosis not present

## 2018-03-13 NOTE — Progress Notes (Signed)
OBERA, STAUCH ([id number]) Visit Report for 03/10/2018 Allergy List Details Patient Name: [name], [name]. Date of Service: 03/10/2018 12:30 PM Medical Record Number: [id number] Patient Account Number: [id number] Date of Birth/Sex: 1946/06/24 (72 y.o. Female) Treating RN: Ahmed Prima Primary Care Fernando Stoiber: Sharilyn Sites Other Clinician: Referring Chan Rosasco: Eleonore Chiquito Treating Temprence Rhines/Extender: Ricard Dillon Weeks in Treatment: 0 Allergies Active Allergies ACE Inhibitors Allergy Notes Electronic Signature(s) Signed: 03/10/2018 3:54:16 PM By: Alric Quan Entered By: Alric Quan on 03/10/2018 12:53:57 [name] ([id number]) -------------------------------------------------------------------------------- Arrival Information Details Patient Name: [name]. Date of Service: 03/10/2018 12:30 PM Medical Record Number: [id number] Patient Account Number: [id number] Date of Birth/Sex: [birth year] (72 y.o. Female) Treating RN: Ahmed Prima Primary Care Yoshie Kosel: Sharilyn Sites Other Clinician: Referring Gilliam Hawkes: Eleonore Chiquito Treating Delsy Etzkorn/Extender: Tito Dine in Treatment: 0 Visit Information Patient Arrived: Wheel Chair Arrival Time: 12:42 Accompanied By: husband Transfer Assistance: EasyPivot Patient Lift Patient Identification Verified: Yes Secondary Verification Process Yes Completed: Patient Requires Transmission-Based No Precautions: Patient Has Alerts: Yes Patient Alerts: Patient on Blood Thinner Eliquis DM II Electronic Signature(s) Signed: 03/10/2018 3:54:16 PM By: Alric Quan Entered By: Alric Quan on 03/10/2018 12:45:13 [name] ([id number]) -------------------------------------------------------------------------------- Clinic Level of Care Assessment Details Patient Name: [name]. Date of Service: 03/10/2018 12:30 PM Medical Record Number: [id number] Patient  Account Number: [id number] Date of Birth/Sex: January 04, 1946 (72 y.o. Female) Treating RN: Cornell Barman Primary Care Makenzee Choudhry: Sharilyn Sites Other Clinician: Referring Ras Kollman: Eleonore Chiquito Treating Marciano Mundt/Extender: Tito Dine in Treatment: 0 Clinic Level of Care Assessment Items TOOL 1 Quantity Score []  - Use when EandM and Procedure is performed on INITIAL visit 0 ASSESSMENTS - Nursing Assessment / Reassessment X - General Physical Exam (combine w/ comprehensive assessment (listed just below) when 1 20 performed on new pt. evals) X- 1 25 Comprehensive Assessment (HX, ROS, Risk Assessments, Wounds Hx, etc.) ASSESSMENTS - Wound and Skin Assessment / Reassessment []  - Dermatologic / Skin Assessment (not related to wound area) 0 ASSESSMENTS - Ostomy and/or Continence Assessment and Care []  - Incontinence Assessment and Management 0 []  - 0 Ostomy Care Assessment and Management (repouching, etc.) PROCESS - Coordination of Care []  - Simple Patient / Family Education for ongoing care 0 X- 1 20 Complex (extensive) Patient / Family Education for ongoing care X- 1 10 Staff obtains Programmer, systems, Records, Test Results / Process Orders []  - 0 Staff telephones HHA, Nursing Homes / Clarify orders / etc []  - 0 Routine Transfer to another Facility (non-emergent condition) []  - 0 Routine Hospital Admission (non-emergent condition) X- 1 15 New Admissions / Biomedical engineer / Ordering NPWT, Apligraf, etc. []  - 0 Emergency Hospital Admission (emergent condition) PROCESS - Special Needs []  - Pediatric / Minor Patient Management 0 []  - 0 Isolation Patient Management []  - 0 Hearing / Language / Visual special needs []  - 0 Assessment of Community assistance (transportation, D/C planning, etc.) []  - 0 Additional assistance / Altered mentation []  - 0 Support Surface(s) Assessment (bed, cushion, seat, etc.) ARIEN, MORINE F. ([id number]) INTERVENTIONS - Miscellaneous []  -  External ear exam 0 []  - 0 Patient Transfer (multiple staff / Civil Service fast streamer / Similar devices) []  - 0 Simple Staple / Suture removal (25 or less) []  - 0 Complex Staple / Suture removal (26 or more) []  - 0 Hypo/Hyperglycemic Management (do not check if billed separately) X- 1 15 Ankle / Brachial Index (ABI) - do not check if billed separately Has the patient been seen at  the hospital within the last three years: Yes Total Score: 105 Level Of Care: New/Established - Level 3 Electronic Signature(s) Signed: 03/11/2018 8:47:48 AM By: Gretta Cool, BSN, RN, CWS, Kim RN, BSN Entered By: Gretta Cool, BSN, RN, CWS, Kim on 03/10/2018 13:58:44 [name] ([id number]) -------------------------------------------------------------------------------- Encounter Discharge Information Details Patient Name: [name]. Date of Service: 03/10/2018 12:30 PM Medical Record Number: [id number] Patient Account Number: [id number] Date of Birth/Sex: August 29, 1946 (72 y.o. Female) Treating RN: Montey Hora Primary Care Darrick Greenlaw: Sharilyn Sites Other Clinician: Referring Aarin Sparkman: Eleonore Chiquito Treating Lismary Kiehn/Extender: Tito Dine in Treatment: 0 Encounter Discharge Information Items Discharge Condition: Stable Ambulatory Status: Wheelchair Discharge Destination: Home Transportation: Private Auto Accompanied By: spouse Schedule Follow-up Appointment: Yes Clinical Summary of Care: Electronic Signature(s) Signed: 03/10/2018 3:17:36 PM By: Montey Hora Entered By: Montey Hora on 03/10/2018 15:17:36 [name] ([id number]) -------------------------------------------------------------------------------- Lower Extremity Assessment Details Patient Name: [name]. Date of Service: 03/10/2018 12:30 PM Medical Record Number: [id number] Patient Account Number: [id number] Date of Birth/Sex: October 20, 1945 (72 y.o. Female) Treating RN: Carolyne Fiscal, Debi Primary Care Faris Coolman:  Sharilyn Sites Other Clinician: Referring Kelvon Giannini: Eleonore Chiquito Treating Zaniyah Wernette/Extender: Tito Dine in Treatment: 0 Edema Assessment Assessed: [Left: No] [Right: No] Edema: [Left: No] [Right: No] Calf Left: Right: Point of Measurement: 36 cm From Medial Instep 32.2 cm 31 cm Ankle Left: Right: Point of Measurement: 12 cm From Medial Instep 19.6 cm 21 cm Vascular Assessment Pulses: Dorsalis Pedis Palpable: [Left:No] [Right:Yes] Doppler Audible: [Left:Yes] [Right:Yes] Posterior Tibial Palpable: [Left:No] [Right:Yes] Doppler Audible: [Left:Yes] [Right:Yes] Extremity colors, hair growth, and conditions: Extremity Color: [Left:Hyperpigmented] [Right:Hyperpigmented] Hair Growth on Extremity: [Left:No] [Right:No] Temperature of Extremity: [Left:Cool] [Right:Cool] Capillary Refill: [Left:< 3 seconds] [Right:< 3 seconds] Toe Nail Assessment Left: Right: Thick: Yes Yes Discolored: Yes Yes Deformed: No No Improper Length and Hygiene: Yes Yes Notes ABI Radcliffe R; ABI L TOO PAINFUL Electronic Signature(s) Signed: 03/10/2018 3:54:16 PM By: Alric Quan Entered By: Alric Quan on 03/10/2018 13:14:59 [name] ([id number]) -------------------------------------------------------------------------------- Multi Wound Chart Details Patient Name: [name]. Date of Service: 03/10/2018 12:30 PM Medical Record Number: [id number] Patient Account Number: [id number] Date of Birth/Sex: [birth year] (72 y.o. Female) Treating RN: Cornell Barman Primary Care Tomi Grandpre: Sharilyn Sites Other Clinician: Referring Soila Printup: Eleonore Chiquito Treating Oran Dillenburg/Extender: Tito Dine in Treatment: 0 Vital Signs Height(in): 84 Pulse(bpm): 64 Weight(lbs): 164 Blood Pressure(mmHg): 120/22 Body Mass Index(BMI): 27 Temperature(F): 97.9 Respiratory Rate 16 (breaths/min): Photos: [1:No Photos] [2:No Photos] [3:No Photos] Wound Location: [1:Right Lower Leg -  Anterior] [2:Right Lower Leg - Posterior] [3:Right Calcaneus - Lateral] Wounding Event: [1:Gradually Appeared] [2:Gradually Appeared] [3:Gradually Appeared] Primary Etiology: [1:Calciphylaxis] [2:Calciphylaxis] [3:Calciphylaxis] Secondary Etiology: [1:Diabetic Wound/Ulcer of the Lower Extremity] [2:Diabetic Wound/Ulcer of the Lower Extremity] [3:Diabetic Wound/Ulcer of the Lower Extremity] Comorbid History: [1:Cataracts, Anemia, Chronic Obstructive Pulmonary Disease (COPD), Arrhythmia, Hypertension, Type II Diabetes, End Stage Renal Disease, Gout, Rheumatoid Arthritis] [2:Cataracts, Anemia, Chronic Obstructive Pulmonary Disease (COPD),  Arrhythmia, Hypertension, Type II Diabetes, End Stage Renal Disease, Gout, Rheumatoid Arthritis] [3:Cataracts, Anemia, Chronic Obstructive Pulmonary Disease (COPD), Arrhythmia, Hypertension, Type II Diabetes, End Stage Renal Disease, Gout, Rheumatoid  Arthritis] Date Acquired: [1:06/29/2017] [2:06/29/2017] [3:06/29/2017] Weeks of Treatment: [1:0] [2:0] [3:0] Wound Status: [1:Open] [2:Open] [3:Open] Measurements L x W x D [1:9.6x4.6x0.2] [2:12.7x11.5x0.3] [3:1.1x1x0.1] (cm) Area (cm) : [1:34.683] [2:114.707] [3:0.864] Volume (cm) : [1:6.937] [2:34.412] [3:0.086] Classification: [1:Full Thickness Without Exposed Support Structures] [2:Full Thickness Without Exposed Support Structures] [3:Full Thickness Without Exposed Support Structures] Exudate Amount: [1:Large] [2:Large] [3:None  Present] Exudate Type: [1:Purulent] [2:Purulent] [3:N/A] Exudate Color: [1:yellow, brown, green] [2:yellow, brown, green] [3:N/A] Foul Odor After Cleansing: [1:Yes] [2:N/A] [3:No] Odor Anticipated Due to [1:No] [2:N/A] [3:N/A] Product Use: Wound Margin: [1:Distinct, outline attached] [2:Distinct, outline attached] [3:Flat and Intact] Granulation Amount: [1:Large (67-100%)] [2:N/A] [3:None Present (0%)] Granulation Quality: [1:Red] [2:N/A] [3:N/A] Necrotic Amount: [1:Small (1-33%)]  [2:N/A] [3:Large (67-100%)] Necrotic Tissue: [1:Eschar, Adherent Slough] [2:N/A] [3:Eschar] Exposed Structures: [1:Fascia: No Fat Layer (Subcutaneous Tissue) Exposed: No Tendon: No] [2:Fascia: No Fat Layer (Subcutaneous Tissue) Exposed: No Tendon: No] [3:N/A] Muscle: No Muscle: No Joint: No Joint: No Bone: No Bone: No Epithelialization: None None None Periwound Skin Texture: Scarring: Yes Scarring: Yes No Abnormalities Noted Periwound Skin Moisture: No Abnormalities Noted No Abnormalities Noted No Abnormalities Noted Periwound Skin Color: Erythema: Yes Erythema: Yes No Abnormalities Noted Erythema Location: Circumferential Circumferential N/A Temperature: No Abnormality No Abnormality No Abnormality Tenderness on Palpation: Yes Yes Yes Wound Preparation: Ulcer Cleansing: Ulcer Cleansing: Ulcer Cleansing: Rinsed/Irrigated with Saline, Rinsed/Irrigated with Saline, Rinsed/Irrigated with Saline, Other: soap and water Other: soap and water Other: soap and water Topical Anesthetic Applied: Topical Anesthetic Applied: Topical Anesthetic Applied: Other: lidocaine 4% Other: lidocaine 4% Other: lidocaine 4% Treatment Notes Electronic Signature(s) Signed: 03/11/2018 8:47:48 AM By: Gretta Cool, BSN, RN, CWS, Kim RN, BSN Entered By: Gretta Cool, BSN, RN, CWS, Kim on 03/10/2018 13:32:57 [name] ([id number]) -------------------------------------------------------------------------------- [place] Details Patient Name: AVITAL, DANCY. Date of Service: 03/10/2018 12:30 PM Medical Record Number: [id number] Patient Account Number: [id number] Date of Birth/Sex: 11/03/1945 (72 y.o. Female) Treating RN: Cornell Barman Primary Care Miyana Mordecai: Sharilyn Sites Other Clinician: Referring Anasofia Micallef: Eleonore Chiquito Treating Leonardo Makris/Extender: Tito Dine in Treatment: 0 Active Inactive ` Necrotic Tissue Nursing Diagnoses: Impaired tissue integrity related to  necrotic/devitalized tissue Goals: Necrotic/devitalized tissue will be minimized in the wound bed Date Initiated: 03/10/2018 Target Resolution Date: 05/10/2018 Goal Status: Active Patient/caregiver will verbalize understanding of reason and process for debridement of necrotic tissue Date Initiated: 03/10/2018 Target Resolution Date: 03/25/2018 Goal Status: Active Interventions: Assess patient pain level pre-, during and post procedure and prior to discharge Treatment Activities: Apply topical anesthetic as ordered : 03/10/2018 Notes: ` Orientation to the Wound Care Program Nursing Diagnoses: Knowledge deficit related to the wound healing center program Goals: Patient/caregiver will verbalize understanding of the [place] Date Initiated: 03/10/2018 Target Resolution Date: 04/02/2018 Goal Status: Active Interventions: Provide education on orientation to the wound center Notes: ` Soft Tissue Infection Nursing Diagnoses: Impaired tissue integrity Potential for infection: soft tissue IVELIS, NORGARD ([id number]) Goals: Patient will remain free of wound infection Date Initiated: 03/10/2018 Target Resolution Date: 04/02/2018 Goal Status: Active Interventions: Assess signs and symptoms of infection every visit Notes: ` Wound/Skin Impairment Nursing Diagnoses: Impaired tissue integrity Goals: Ulcer/skin breakdown will have a volume reduction of 80% by week 12 Date Initiated: 03/10/2018 Target Resolution Date: 06/10/2018 Goal Status: Active Interventions: Assess ulceration(s) every visit Treatment Activities: Skin care regimen initiated : 03/10/2018 Topical wound management initiated : 03/10/2018 Notes: Electronic Signature(s) Signed: 03/11/2018 8:47:48 AM By: Gretta Cool, BSN, RN, CWS, Kim RN, BSN Entered By: Gretta Cool, BSN, RN, CWS, Kim on 03/10/2018 13:32:26 [name]  ([id number]) -------------------------------------------------------------------------------- Pain Assessment Details Patient Name: [name]. Date of Service: 03/10/2018 12:30 PM Medical Record Number: [id number] Patient Account Number: [id number] Date of Birth/Sex: 1946-09-06 (72 y.o. Female) Treating RN: Carolyne Fiscal, Debi Primary Care Harlynn Kimbell: Sharilyn Sites Other Clinician: Referring Susy Placzek: Eleonore Chiquito Treating Vannak Montenegro/Extender: Tito Dine in  Treatment: 0 Active Problems Location of Pain Severity and Description of Pain Patient Has Paino No Site Locations Pain Management and Medication Current Pain Management: Electronic Signature(s) Signed: 03/10/2018 3:54:16 PM By: Alric Quan Entered By: Alric Quan on 03/10/2018 12:45:44 [name] ([id number]) -------------------------------------------------------------------------------- Patient/Caregiver Education Details Patient Name: [name]. Date of Service: 03/10/2018 12:30 PM Medical Record Number: [id number] Patient Account Number: [id number] Date of Birth/Gender: [birth year] (72 y.o. Female) Treating RN: Montey Hora Primary Care Physician: Sharilyn Sites Other Clinician: Referring Physician: Eleonore Chiquito Treating Physician/Extender: Tito Dine in Treatment: 0 Education Assessment Education Provided To: Patient and Caregiver Education Topics Provided Venous: Handouts: Other: leg elevation and wrap precautions Methods: Explain/Verbal Responses: State content correctly Electronic Signature(s) Signed: 03/10/2018 3:50:46 PM By: Montey Hora Entered By: Montey Hora on 03/10/2018 15:18:04 [name] ([id number]) -------------------------------------------------------------------------------- Wound Assessment Details Patient Name: [name]. Date of Service: 03/10/2018 12:30 PM Medical Record Number: [id number] Patient Account Number:  [id number] Date of Birth/Sex: July 23, 1946 (72 y.o. Female) Treating RN: Carolyne Fiscal, Debi Primary Care Suliman Termini: Sharilyn Sites Other Clinician: Referring Rekia Kujala: Eleonore Chiquito Treating Iline Buchinger/Extender: Tito Dine in Treatment: 0 Wound Status Wound Number: 1 Primary Calciphylaxis Etiology: Wound Location: Right Lower Leg - Anterior Secondary Diabetic Wound/Ulcer of the Lower Extremity Wounding Event: Gradually Appeared Etiology: Date Acquired: 06/29/2017 Wound Open Weeks Of Treatment: 0 Status: Clustered Wound: No Comorbid Cataracts, Anemia, Chronic Obstructive History: Pulmonary Disease (COPD), Arrhythmia, Hypertension, Type II Diabetes, End Stage Renal Disease, Gout, Rheumatoid Arthritis Photos Photo Uploaded By: Alric Quan on 03/10/2018 15:51:09 Wound Measurements Length: (cm) 9.6 Width: (cm) 4.6 Depth: (cm) 0.2 Area: (cm) 34.683 Volume: (cm) 6.937 % Reduction in Area: % Reduction in Volume: Epithelialization: None Tunneling: No Undermining: No Wound Description Full Thickness Without Exposed Support Classification: Structures Wound Margin: Distinct, outline attached Exudate Large Amount: Exudate Type: Purulent Exudate Color: yellow, brown, green Foul Odor After Cleansing: Yes Due to Product Use: No Slough/Fibrino Yes Wound Bed Granulation Amount: Large (67-100%) Exposed Structure Granulation Quality: Red Fascia Exposed: No Necrotic Amount: Small (1-33%) Fat Layer (Subcutaneous Tissue) Exposed: No Necrotic Quality: Eschar, Adherent Slough Tendon Exposed: No Muscle Exposed: No Joint Exposed: No Bone Exposed: No Tamber, Burtch Mackensey F. ([id number]) Periwound Skin Texture Texture Color No Abnormalities Noted: No No Abnormalities Noted: No Scarring: Yes Erythema: Yes Erythema Location: Circumferential Moisture No Abnormalities Noted: No Temperature / Pain Temperature: No Abnormality Tenderness on Palpation: Yes Wound  Preparation Ulcer Cleansing: Rinsed/Irrigated with Saline, Other: soap and water, Topical Anesthetic Applied: Other: lidocaine 4%, Treatment Notes Wound #1 (Right, Anterior Lower Leg) 1. Cleansed with: Cleanse wound with antibacterial soap and water 2. Anesthetic Topical Lidocaine 4% cream to wound bed prior to debridement 4. Dressing Applied: Other dressing (specify in notes) 5. Secondary Dressing Applied ABD Pad 7. Secured with Tape Other (specify in notes) Notes silvercel, kerramax, kerlix/coban wrap Electronic Signature(s) Signed: 03/10/2018 3:54:16 PM By: Alric Quan Entered By: Alric Quan on 03/10/2018 13:18:28 [name] ([id number]) -------------------------------------------------------------------------------- Wound Assessment Details Patient Name: [name]. Date of Service: 03/10/2018 12:30 PM Medical Record Number: [id number] Patient Account Number: [id number] Date of Birth/Sex: 17-Dec-1945 (72 y.o. Female) Treating RN: Carolyne Fiscal, Debi Primary Care Denorris Reust: Sharilyn Sites Other Clinician: Referring Madison Direnzo: Eleonore Chiquito Treating Frimet Durfee/Extender: Ricard Dillon Weeks in Treatment: 0 Wound Status Wound Number: 2 Primary Calciphylaxis Etiology: Wound Location: Right Lower Leg - Posterior Secondary Diabetic Wound/Ulcer of the Lower Extremity Wounding Event: Gradually Appeared Etiology: Date Acquired: 06/29/2017 Wound Open Weeks Of  Treatment: 0 Status: Clustered Wound: No Comorbid Cataracts, Anemia, Chronic Obstructive History: Pulmonary Disease (COPD), Arrhythmia, Hypertension, Type II Diabetes, End Stage Renal Disease, Gout, Rheumatoid Arthritis Photos Photo Uploaded By: Alric Quan on 03/10/2018 15:51:10 Wound Measurements Length: (cm) 12.7 Width: (cm) 11.5 Depth: (cm) 0.3 Area: (cm) 114.707 Volume: (cm) 34.412 % Reduction in Area: % Reduction in Volume: Epithelialization: None Tunneling: No Undermining:  No Wound Description Full Thickness Without Exposed Support Classification: Structures Wound Margin: Distinct, outline attached Exudate Large Amount: Exudate Type: Purulent Exudate Color: yellow, brown, green Wound Bed Exposed Structure Fascia Exposed: No Fat Layer (Subcutaneous Tissue) Exposed: No Tendon Exposed: No Muscle Exposed: No Joint Exposed: No Bone Exposed: No Makenzy, Krist Gatha F. ([id number]) Periwound Skin Texture Texture Color No Abnormalities Noted: No No Abnormalities Noted: No Scarring: Yes Erythema: Yes Erythema Location: Circumferential Moisture No Abnormalities Noted: No Temperature / Pain Temperature: No Abnormality Tenderness on Palpation: Yes Wound Preparation Ulcer Cleansing: Rinsed/Irrigated with Saline, Other: soap and water, Topical Anesthetic Applied: Other: lidocaine 4%, Treatment Notes Wound #2 (Right, Posterior Lower Leg) 1. Cleansed with: Cleanse wound with antibacterial soap and water 2. Anesthetic Topical Lidocaine 4% cream to wound bed prior to debridement 4. Dressing Applied: Other dressing (specify in notes) 5. Secondary Dressing Applied ABD Pad 7. Secured with Tape Other (specify in notes) Notes silvercel, kerramax, kerlix/coban wrap Electronic Signature(s) Signed: 03/10/2018 3:54:16 PM By: Alric Quan Entered By: Alric Quan on 03/10/2018 13:20:46 [name] ([id number]) -------------------------------------------------------------------------------- Wound Assessment Details Patient Name: [name]. Date of Service: 03/10/2018 12:30 PM Medical Record Number: [id number] Patient Account Number: [id number] Date of Birth/Sex: 11-12-1945 (72 y.o. Female) Treating RN: Carolyne Fiscal, Debi Primary Care Kashden Deboy: Sharilyn Sites Other Clinician: Referring Daley Gosse: Eleonore Chiquito Treating Yacob Wilkerson/Extender: Tito Dine in Treatment: 0 Wound Status Wound Number: 3 Primary  Calciphylaxis Etiology: Wound Location: Right Calcaneus - Lateral Secondary Diabetic Wound/Ulcer of the Lower Extremity Wounding Event: Gradually Appeared Etiology: Date Acquired: 06/29/2017 Wound Open Weeks Of Treatment: 0 Status: Clustered Wound: No Comorbid Cataracts, Anemia, Chronic Obstructive History: Pulmonary Disease (COPD), Arrhythmia, Hypertension, Type II Diabetes, End Stage Renal Disease, Gout, Rheumatoid Arthritis Photos Photo Uploaded By: Alric Quan on 03/10/2018 15:51:32 Wound Measurements Length: (cm) 1.1 Width: (cm) 1 Depth: (cm) 0.1 Area: (cm) 0.864 Volume: (cm) 0.086 % Reduction in Area: % Reduction in Volume: Epithelialization: None Tunneling: No Undermining: No Wound Description Full Thickness Without Exposed Support Classification: Structures Wound Margin: Flat and Intact Exudate None Present Amount: Foul Odor After Cleansing: No Slough/Fibrino Yes Wound Bed Granulation Amount: None Present (0%) Necrotic Amount: Large (67-100%) Necrotic Quality: Eschar Periwound Skin Texture Texture Color No Abnormalities Noted: No No Abnormalities Noted: No Moisture Temperature / Pain No Abnormalities Noted: No Temperature: No Abnormality Minassian, Tahesha F. ([id number]) Tenderness on Palpation: Yes Wound Preparation Ulcer Cleansing: Rinsed/Irrigated with Saline, Other: soap and water, Topical Anesthetic Applied: Other: lidocaine 4%, Treatment Notes Wound #3 (Right, Lateral Calcaneus) 1. Cleansed with: Cleanse wound with antibacterial soap and water 2. Anesthetic Topical Lidocaine 4% cream to wound bed prior to debridement 4. Dressing Applied: Other dressing (specify in notes) 5. Secondary Dressing Applied ABD Pad 7. Secured with Tape Other (specify in notes) Notes silvercel, kerramax, kerlix/coban wrap Electronic Signature(s) Signed: 03/10/2018 3:54:16 PM By: Alric Quan Entered By: Alric Quan on 03/10/2018  13:22:13 [name] ([id number]) -------------------------------------------------------------------------------- Vitals Details Patient Name: [name]. Date of Service: 03/10/2018 12:30 PM Medical Record Number: [id number] Patient Account Number: [id number] Date of Birth/Sex: [birth year] (72 y.o. Female)  Treating RN: Ahmed Prima Primary Care Raeden Schippers: Sharilyn Sites Other Clinician: Referring Apollo Timothy: Eleonore Chiquito Treating Quinnetta Roepke/Extender: Tito Dine in Treatment: 0 Vital Signs Time Taken: 12:46 Temperature (F): 97.9 Height (in): 65 Pulse (bpm): 66 Source: Stated Respiratory Rate (breaths/min): 16 Weight (lbs): 164 Blood Pressure (mmHg): 120/22 Source: Stated Reference Range: 80 - 120 mg / dl Body Mass Index (BMI): 27.3 Notes Made Dr. Dellia Nims aware of BP. Pt is a dialysis pt. Electronic Signature(s) Signed: 03/10/2018 3:54:16 PM By: Alric Quan Entered By: Alric Quan on 03/10/2018 12:53:39

## 2018-03-13 NOTE — Progress Notes (Signed)
KIYONA, MCNALL ([id number]) Visit Report for 03/10/2018 Chief Complaint Document Details Patient Name: [name], KONCZAL. Date of Service: 03/10/2018 12:30 PM Medical Record Number: [id number] Patient Account Number: [id number] Date of Birth/Sex: 01/09/1946 (72 y.o. Female) Treating RN: Primary Care Provider: Sharilyn Sites Other Clinician: Referring Provider: Eleonore Chiquito Treating Provider/Extender: Tito Dine in Treatment: 0 Information Obtained from: Patient Chief Complaint 03/10/18; patient is here for review of wounds predominantly on her right calf with substantial full-thickness skin loss. Electronic Signature(s) Signed: 03/10/2018 4:49:36 PM By: Linton Ham MD Entered By: Linton Ham on 03/10/2018 14:58:26 [name] ([id number]) -------------------------------------------------------------------------------- Debridement Details Patient Name: [name]. Date of Service: 03/10/2018 12:30 PM Medical Record Number: [id number] Patient Account Number: [id number] Date of Birth/Sex: 1945/11/11 (72 y.o. Female) Treating RN: Cornell Barman Primary Care Provider: Sharilyn Sites Other Clinician: Referring Provider: Eleonore Chiquito Treating Provider/Extender: Tito Dine in Treatment: 0 Debridement Performed for Wound #1 Right,Anterior Lower Leg Assessment: Performed By: Physician Ricard Dillon, MD Debridement Type: Debridement Severity of Tissue Pre Fat layer exposed Debridement: Pre-procedure Verification/Time Yes - 13:41 Out Taken: Start Time: 13:41 Pain Control: Other : lidocaine 4% Total Area Debrided (L x W): 9.6 (cm) x 4.6 (cm) = 44.16 (cm) Tissue and other material Viable, Non-Viable, Eschar, Fat, Slough, Subcutaneous, Slough debrided: Level: Skin/Subcutaneous Tissue Debridement Description: Excisional Instrument: Blade, Forceps Bleeding: Large Hemostasis Achieved: Pressure End Time: 13:45 Procedural Pain:  3 Post Procedural Pain: 1 Response to Treatment: Procedure was tolerated well Level of Consciousness: Awake and Alert Post Debridement Measurements of Total Wound Length: (cm) 9.6 Width: (cm) 4.6 Depth: (cm) 0.4 Volume: (cm) 13.873 Character of Wound/Ulcer Post Debridement: Requires Further Debridement Severity of Tissue Post Debridement: Fat layer exposed Post Procedure Diagnosis Same as Pre-procedure Electronic Signature(s) Signed: 03/10/2018 4:49:36 PM By: Linton Ham MD Signed: 03/11/2018 8:47:48 AM By: Gretta Cool, BSN, RN, CWS, Kim RN, BSN Entered By: Gretta Cool, BSN, RN, CWS, Kim on 03/10/2018 13:49:51 [name] ([id number]) -------------------------------------------------------------------------------- Debridement Details Patient Name: [name]. Date of Service: 03/10/2018 12:30 PM Medical Record Number: [id number] Patient Account Number: [id number] Date of Birth/Sex: [birth year] (72 y.o. Female) Treating RN: Primary Care Provider: Sharilyn Sites Other Clinician: Referring Provider: Eleonore Chiquito Treating Provider/Extender: Tito Dine in Treatment: 0 Debridement Performed for Wound #2 Right,Posterior Lower Leg Assessment: Performed By: Physician Ricard Dillon, MD Debridement Type: Debridement Severity of Tissue Pre Fat layer exposed Debridement: Pre-procedure Verification/Time Yes - 13:41 Out Taken: Start Time: 13:41 Pain Control: Other : lidocaine 4% Total Area Debrided (L x W): 6 (cm) x 6 (cm) = 36 (cm) Tissue and other material Viable, Non-Viable, Eschar, Fat, Slough, Subcutaneous, Slough debrided: Level: Skin/Subcutaneous Tissue Debridement Description: Excisional Instrument: Blade, Forceps Bleeding: Large Hemostasis Achieved: Pressure End Time: 13:45 Procedural Pain: 3 Post Procedural Pain: 1 Response to Treatment: Procedure was tolerated well Level of Consciousness: Awake and Alert Post Debridement Measurements of  Total Wound Length: (cm) 12.5 Width: (cm) 11.5 Depth: (cm) 0.3 Volume: (cm) 33.87 Character of Wound/Ulcer Post Debridement: Requires Further Debridement Severity of Tissue Post Debridement: Fat layer exposed Post Procedure Diagnosis Same as Pre-procedure Electronic Signature(s) Signed: 03/10/2018 4:49:36 PM By: Linton Ham MD Entered By: Linton Ham on 03/10/2018 15:46:15 [name] ([id number]) -------------------------------------------------------------------------------- HPI Details Patient Name: [name]. Date of Service: 03/10/2018 12:30 PM Medical Record Number: [id number] Patient Account Number: [id number] Date of Birth/Sex: May 22, 1946 (72 y.o. Female) Treating RN: Primary Care Provider: Sharilyn Sites Other Clinician: Referring Provider:  LAMA, Frederich Chick Treating Provider/Extender: Ricard Dillon Weeks in Treatment: 0 History of Present Illness HPI Description: ADMISSION 03/10/18; This is a 72 year old woman with a history of end-stage renal disease on hemodialysis, type 2 diabetes on insulin with excellent control, systemic lupus. Her problem began in October 2018 she developed a blister in an open wound on the right anterior tibial mid tibial area. This progressed and worsened until the tissue loss was substantial on the anterior and posterior right calf. she was followed in [place] by physical therapy at one point although I have not reviewed their notes. She apparently did not receive pulse lavage to the wound. I note that she was hospitalized from 03/01/18 through 03/03/18 predominantly for debridement of the wound by general surgery. She saw Dr. Arnoldo Morale who did not believe there was a role for surgical debridement. Was felt that definitive treatment would be amputation but the patient already been offered this by vein and vascular in [facility] and she refused arrangements were made for her to be sent to this clinic for review. The patient is  already had a reasonably substantial review for this problem. she saw vein and vascular extensively over the course of this year.her noninvasive arterial studies done in December 2018 suggested a right superficial femoral artery occlusion/stenosis. She did not have any issues on the left. At that point her ABI on the right was 0.8 with monophasic waveforms 1.06 on the left. However she had a velocity drop off in the superficial femoral artery. She went on to have diagnostic angiogram on 12/18/17 by Dr. Oneida Alar. This showed on the right that the common femoral, profunda femoris and superficial femoral arteries were all patent. There was a focal less than 1 mm length stenosis on the right superficial femoral artery otherwise the artery was widely patent. The right popliteal artery was widely patent. There was 3 vessel runoff via the anterior, tibial and paranoid Milta Deiters arteries to the right foot. She was not felt to have a vascular cause of these wounds. He's also had 2 separate biopsies. Firstly on 3/25 showed nonspecific findings with possible axis not entirely excluded. No definite calcium deposits were seen within the dermis within the sections reviewed. Vascular features were nonspecific. Furthermore pyogenic gain granuloma may demonstrate similar findings. 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 Calciphylaxis was not favored in this specimen. There was a calcified vessel. I don't see a pathology specimen that specifically says definitive calciphylaxis. The patient had an x-ray of her right tibia while she is in hospital there was no osteomyelitis. She had an MRI of her foot in March that did not show osteomyelitis as well The patient has been using Betadine and wraps to the wounds. This is being done by a home health where they live in [place] Signature(s) Signed:  03/10/2018 4:49:36 PM By: Linton Ham MD Entered By: Linton Ham on 03/10/2018 15:28:03 [name] ([id number]) -------------------------------------------------------------------------------- Physical Exam Details Patient Name: [name], [name]. Date of Service: 03/10/2018 12:30 PM Medical Record Number: [id number] Patient Account Number: [id number] Date of Birth/Sex: Apr 06, 1946 (72 y.o. Female) Treating RN: Primary Care Provider: Sharilyn Sites Other Clinician: Referring Provider: Eleonore Chiquito Treating Provider/Extender: Ricard Dillon Weeks in Treatment: 0 Constitutional Sitting or standing Blood Pressure is within target range for patient.. Pulse regular and within target range for patient.Marland Kitchen Respirations regular, non-labored and within target range.. Temperature is normal and within the  target range for the patient.Marland Kitchen appears in no distress. Eyes Conjunctivae clear. No discharge. Respiratory Respiratory effort is easy and symmetric bilaterally. Rate is normal at rest and on room air.. Bilateral breath sounds are clear and equal in all lobes with no wheezes, rales or rhonchi.. Cardiovascular Heart rhythm and rate regular, without murmur or gallop.. the pulses are palpable bilaterally. shunt in the left arm.. Gastrointestinal (GI) Abdomen is soft and non-distended without masses or tenderness. Bowel sounds active in all quadrants.. No liver or spleen enlargement or tenderness.. Lymphatic none palpable in the popliteal or inguinal area. Integumentary (Hair, Skin) no systemic skin issues. She has blisters on several dorsal toes on the right. Notes wound exam oThe patient has substantial full-thickness skin ulcers on predominantly the right anterior tibial area and also posteriorly on the right calf. Especially anteriorly covered in thick black necrotic surface. Most of the rest of the surface of these wounds covered and tightly adherent necrotic surface debris.  Using a #10 scalpel and pickups the thick black material removed from the surface of the wound. The patient tolerated this surprisingly well. Hemostasis with direct pressure. Then using an open curet I removed as much of the adherent material as possible. Again hemostasis with direct pressure. oShe also has a dried area with surface eschar on the right lateral heel and the dorsal aspect of several of her toes these almost looked ischemic Electronic Signature(s) Signed: 03/10/2018 4:49:36 PM By: Linton Ham MD Entered By: Linton Ham on 03/10/2018 15:36:02 [name] ([id number]) -------------------------------------------------------------------------------- Physician Orders Details Patient Name: [name]. Date of Service: 03/10/2018 12:30 PM Medical Record Number: [id number] Patient Account Number: [id number] Date of Birth/Sex: [birth year] (72 y.o. Female) Treating RN: Cornell Barman Primary Care Provider: Sharilyn Sites Other Clinician: Referring Provider: Eleonore Chiquito Treating Provider/Extender: Tito Dine in Treatment: 0 Verbal / Phone Orders: No Diagnosis Coding Wound Cleansing Wound #1 Right,Anterior Lower Leg o Clean wound with Normal Saline. Wound #2 Right,Posterior Lower Leg o Clean wound with Normal Saline. Wound #3 Right,Lateral Calcaneus o Clean wound with Normal Saline. Anesthetic (add to Medication List) Wound #1 Right,Anterior Lower Leg o Topical Lidocaine 4% cream applied to wound bed prior to debridement (In Clinic Only). Wound #2 Right,Posterior Lower Leg o Topical Lidocaine 4% cream applied to wound bed prior to debridement (In Clinic Only). Wound #3 Right,Lateral Calcaneus o Topical Lidocaine 4% cream applied to wound bed prior to debridement (In Clinic Only). Primary Wound Dressing Wound #1 Right,Anterior Lower Leg o Silver Alginate Wound #2 Right,Posterior Lower Leg o Silver Alginate Wound #3 Right,Lateral  Calcaneus o Silver Alginate Secondary Dressing Wound #1 Right,Anterior Lower Leg o Other - Keramax or equal Wound #2 Right,Posterior Lower Leg o Other - Keramax or equal Wound #3 Right,Lateral Calcaneus o Other - Keramax or equal Dressing Change Frequency Wound #1 Right,Anterior Lower Leg o Change Dressing Monday, Wednesday, Friday ALIANNAH, HOLSTROM. ([id number]) Wound #2 Right,Posterior Lower Leg o Change Dressing Monday, Wednesday, Friday Wound #3 Right,Lateral Calcaneus o Change Dressing Monday, Wednesday, Friday Follow-up Appointments Wound #1 Right,Anterior Lower Leg o Return Appointment in 1 week. Wound #2 Right,Posterior Lower Leg o Return Appointment in 1 week. Wound #3 Right,Lateral Calcaneus o Return Appointment in 1 week. Edema Control Wound #1 Right,Anterior Lower Leg o Kerlix and Coban - Right Lower Extremity o Elevate legs to the level of the heart and pump ankles as often as possible Wound #2 Right,Posterior Lower Leg o Kerlix and Coban - Right Lower Extremity o  Elevate legs to the level of the heart and pump ankles as often as possible Wound #3 Right,Lateral Calcaneus o Kerlix and Coban - Right Lower Extremity o Elevate legs to the level of the heart and pump ankles as often as possible Home Health Wound #1 [place] Visits - [place] Nurse may visit PRN to address patientos wound care needs. o FACE TO FACE ENCOUNTER: MEDICARE and MEDICAID PATIENTS: I certify that this patient is under my care and that I had a face-to-face encounter that meets the physician face-to-face encounter requirements with this patient on this date. The encounter with the patient was in whole or in part for the following MEDICAL CONDITION: (primary reason for [place]) MEDICAL NECESSITY: I certify, that based on my findings, NURSING services are a medically necessary home health service.  HOME BOUND STATUS: I certify that my clinical findings support that this patient is homebound (i.e., Due to illness or injury, pt requires aid of supportive devices such as crutches, cane, wheelchairs, walkers, the use of special transportation or the assistance of another person to leave their place of residence. There is a normal inability to leave the home and doing so requires considerable and taxing effort. Other absences are for medical reasons / religious services and are infrequent or of short duration when for other reasons). o If current dressing causes regression in wound condition, may D/C ordered dressing product/s and apply Normal Saline Moist Dressing daily until next [place] / Other MD appointment. [place] of regression in wound condition at [phone]. Wound #2 [place] Visits - [place] Nurse may visit PRN to address patientos wound care needs. o FACE TO FACE ENCOUNTER: MEDICARE and MEDICAID PATIENTS: I certify that this patient is under my care and that I had a face-to-face encounter that meets the physician face-to-face encounter requirements with this patient on this date. The encounter with the patient was in whole or in part for the following MEDICAL CONDITION: (primary reason for [place]) MEDICAL NECESSITY: I certify, that based on my findings, NURSING services are a medically necessary home health service. HOME BOUND STATUS: I certify that my HARUMI, YAMIN ([id number]) clinical findings support that this patient is homebound (i.e., Due to illness or injury, pt requires aid of supportive devices such as crutches, cane, wheelchairs, walkers, the use of special transportation or the assistance of another person to leave their place of residence. There is a normal inability to leave the home and doing so requires considerable and taxing effort. Other absences are  for medical reasons / religious services and are infrequent or of short duration when for other reasons). o If current dressing causes regression in wound condition, may D/C ordered dressing product/s and apply Normal Saline Moist Dressing daily until next [place] / Other MD appointment. [place] of regression in wound condition at [phone]. Wound #3 Right,Lateral Calcaneus o Continue Home Health Visits - [place] Nurse may visit PRN to address patientos wound care needs. o FACE TO FACE ENCOUNTER: MEDICARE and MEDICAID PATIENTS: I certify that this patient is under my care and that I had a face-to-face encounter that meets the physician face-to-face encounter requirements with this patient on this date. The encounter with the patient was in whole or in part for the following MEDICAL CONDITION: (primary reason for [place]) MEDICAL NECESSITY: I certify, that based  on my findings, NURSING services are a medically necessary home health service. HOME BOUND STATUS: I certify that my clinical findings support that this patient is homebound (i.e., Due to illness or injury, pt requires aid of supportive devices such as crutches, cane, wheelchairs, walkers, the use of special transportation or the assistance of another person to leave their place of residence. There is a normal inability to leave the home and doing so requires considerable and taxing effort. Other absences are for medical reasons / religious services and are infrequent or of short duration when for other reasons). o If current dressing causes regression in wound condition, may D/C ordered dressing product/s and apply Normal Saline Moist Dressing daily until next [place] / Other MD appointment. [place] of regression in wound condition at [phone]. Medications-please add to medication list. Wound #1 Right,Anterior Lower Leg o Santyl  Enzymatic Ointment Wound #2 Right,Posterior Lower Leg o Santyl Enzymatic Ointment Wound #3 Right,Lateral Calcaneus o Santyl Enzymatic Ointment Consults o Dermatology - [facility] Dermatology Patient Medications Allergies: ACE Inhibitors Notifications Medication Indication Start End Santyl DOSE topical 250 unit/gram ointment - ointment topical Notes Patient has appointment with Vascular today Electronic Signature(s) Signed: 03/10/2018 4:49:36 PM By: Linton Ham MD Signed: 03/11/2018 8:47:48 AM By: Gretta Cool, BSN, RN, CWS, Kim RN, BSN Entered By: Gretta Cool, BSN, RN, CWS, Kim on 03/10/2018 15:52:25 TIMMY, CLEVERLY ([id number]) JEYLI, ZWICKER ([id number]) -------------------------------------------------------------------------------- Prescription 03/10/2018 Patient Name: [name], [name]. Provider: Ricard Dillon MD Date of Birth: 06-27-1946 NPI#: [id number] Sex: Female DEA#: [id number] Phone #: [phone] License #: [id number] Patient Address: [place] [address] [place], [place] [address] [address] [place], [place] [address] [phone] Allergies ACE Inhibitors Medication Medication: Route: Strength: Form: Santyl topical 250 unit/gram ointment Class: TOPICAL/MUCOUS MEMBR./SUBCUT. ENZYMES Dose: Frequency / Time: Indication: ointment topical Number of Refills: Number of Units: 1 One (1) Tube(s) Generic Substitution: Start Date: End Date: Administered at [place]: No Note to Pharmacy: Signature(s): Date(s): Electronic Signature(s) Signed: 03/10/2018 4:49:36 PM By: Linton Ham MD Signed: 03/11/2018 8:47:48 AM By: Gretta Cool, BSN, RN, CWS, Kim RN, BSN Entered By: Gretta Cool, BSN, RN, CWS, Kim on 03/10/2018 15:52:27 [name] ([id number]) --------------------------------------------------------------------------------  Problem List  Details Patient Name: [name], [name]. Date of Service: 03/10/2018 12:30 PM Medical Record Number: [id number] Patient Account Number: [id number] Date of Birth/Sex: May 10, 1946 (72 y.o. 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 Date of Service: 03/10/2018 12:30 PM Medical Record Number: [id number] Patient Account Number: [id number] Date of Birth/Sex: December 31, 1945 (72 y.o. Female) Treating RN: Primary Care Provider: Sharilyn Sites Other Clinician: Referring Provider: Eleonore Chiquito Treating Provider/Extender: Tito Dine in  Treatment: 0 Subjective Chief Complaint Information obtained from Patient 03/10/18; patient is here for review of wounds predominantly on her right calf with substantial full-thickness skin loss. History of Present Illness (HPI) ADMISSION 03/10/18; This is a 72 year old woman with a history of end-stage renal disease on hemodialysis, type 2 diabetes on  insulin with excellent control, systemic lupus. Her problem began in October 2018 she developed a blister in an open wound on the right anterior tibial mid tibial area. This progressed and worsened until the tissue loss was substantial on the anterior and posterior right calf. she was followed in [place] by physical therapy at one point although I have not reviewed their notes. She apparently did not receive pulse lavage to the wound. I note that she was hospitalized from 03/01/18 through 03/03/18 predominantly for debridement of the wound by general surgery. She saw Dr. Arnoldo Morale who did not believe there was a role for surgical debridement. Was felt that definitive treatment would be amputation but the patient already been offered this by vein and vascular in [facility] and she refused arrangements were made for her to be sent to this clinic for review. The patient is already had a reasonably substantial review for this problem. she saw vein and vascular extensively over the course of this year.her noninvasive arterial studies done in December 2018 suggested a right superficial femoral artery occlusion/stenosis. She did not have any issues on the left. At that point her ABI on the right was 0.8 with monophasic waveforms 1.06 on the left. However she had a velocity drop off in the superficial femoral artery. She went on to have diagnostic angiogram on 12/18/17 by Dr. Oneida Alar. This showed on the right that the common femoral, profunda femoris and superficial femoral arteries were all patent. There was a focal less than 1 mm length stenosis on the right superficial femoral artery otherwise the artery was widely patent. The right popliteal artery was widely patent. There was 3 vessel runoff via the anterior, tibial and paranoid Milta Deiters arteries to the right foot. She was not felt to have a vascular cause of these wounds. He's also had 2 separate biopsies. Firstly on 3/25 showed nonspecific findings with  possible axis not entirely excluded. No definite calcium deposits were seen within the dermis within the sections reviewed. Vascular features were nonspecific. Furthermore pyogenic gain granuloma may demonstrate similar findings. 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 Calciphylaxis was not favored in this specimen. There was a calcified vessel. I don't see a pathology specimen that specifically says definitive calciphylaxis. The patient had an x-ray of her right tibia while she is in hospital there was no osteomyelitis. She had an MRI of her foot in March that did not show osteomyelitis as well The patient has been using Betadine and wraps to the wounds. This is being done by a home health where they live in [place] Patient presents with 1 open wound that has been present for approximately oct 2018. Patient has been treating wound in the following manner: betadine, bandage. Laboratory tests have not been performed in the last month. Patient reportedly has not tested positive for an antibiotic resistant organism. Patient reportedly has not tested positive for osteomyelitis. Patient reportedly has not had testing performed to evaluate circulation in the legs. Patient experiences the following problems associated with their wounds: swelling. DEAJAH, ERKKILA F. ([id number]) Patient History Information obtained from Patient. Allergies ACE  Inhibitors Family History Diabetes - Siblings, Hypertension - Siblings,Father,Mother, Little Disease - Siblings, No family history of Cancer, Heart Disease, Hereditary Spherocytosis, Lung Disease, Seizures, Stroke, Thyroid Problems, Tuberculosis. Social History Former smoker - 10 yrs ago, Marital Status - Married, Alcohol Use - Never, Drug Use - No History, Caffeine Use - Never. Medical History Eyes Patient has history of  Cataracts - surgery Hematologic/Lymphatic Patient has history of Anemia Respiratory Patient has history of Chronic Obstructive Pulmonary Disease (COPD) Cardiovascular Patient has history of Arrhythmia - a-fib, Hypertension Endocrine Patient has history of Type II Diabetes Genitourinary Patient has history of End Stage Renal Disease Musculoskeletal Patient has history of Gout, Rheumatoid Arthritis Patient is treated with Insulin. Blood sugar is tested. Review of Systems (ROS) Constitutional Symptoms (General Health) The patient has no complaints or symptoms. Ear/Nose/Mouth/Throat The patient has no complaints or symptoms. Gastrointestinal The patient has no complaints or symptoms. Genitourinary Complains or has symptoms of Little failure/ Dialysis. Immunological The patient has no complaints or symptoms. Integumentary (Skin) Complains or has symptoms of Wounds. Musculoskeletal lupus Neurologic The patient has no complaints or symptoms. Oncologic The patient has no complaints or symptoms. Psychiatric The patient has no complaints or symptoms. Jayona, Mccaig Cloie F. ([id number]) Objective Constitutional Sitting or standing Blood Pressure is within target range for patient.. Pulse regular and within target range for patient.Marland Kitchen Respirations regular, non-labored and within target range.. Temperature is normal and within the target range for the patient.Marland Kitchen appears in no distress. Vitals Time Taken: 12:46 PM, Height: 65 in, Source: Stated, Weight: 164 lbs, Source: Stated, BMI: 27.3, Temperature: 97.9  F, Pulse: 66 bpm, Respiratory Rate: 16 breaths/min, Blood Pressure: 120/22 mmHg. General Notes: Made Dr. Dellia Nims aware of BP. Pt is a dialysis pt. Eyes Conjunctivae clear. No discharge. Respiratory Respiratory effort is easy and symmetric bilaterally. Rate is normal at rest and on room air.. Bilateral breath sounds are clear and equal in all lobes with no wheezes, rales or  rhonchi.. Cardiovascular Heart rhythm and rate regular, without murmur or gallop.. the pulses are palpable bilaterally. shunt in the left arm.. Gastrointestinal (GI) Abdomen is soft and non-distended without masses or tenderness. Bowel sounds active in all quadrants.. No liver or spleen enlargement or tenderness.. Lymphatic none palpable in the popliteal or inguinal area. General Notes: wound exam The patient has substantial full-thickness skin ulcers on predominantly the right anterior tibial area and also posteriorly on the right calf. Especially anteriorly covered in thick black necrotic surface. Most of the rest of the surface of these wounds covered and tightly adherent necrotic surface debris. Using a #10 scalpel and pickups the thick black material removed from the surface of the wound. The patient tolerated this surprisingly well. Hemostasis with direct pressure. Then using an open curet I removed as much of the adherent material as possible. Again hemostasis with direct pressure. She also has a dried area with surface eschar on the right lateral heel and the dorsal aspect of several of her toes these almost looked ischemic Integumentary (Hair, Skin) no systemic skin issues. She has blisters on several dorsal toes on the right. Wound #1 status is Open. Original cause of wound was Gradually Appeared. The wound is located on the Right,Anterior Lower Leg. The wound measures 9.6cm length x 4.6cm width x 0.2cm depth; 34.683cm^2 area and 6.937cm^3 volume. There is no tunneling or undermining noted. There is a large amount of purulent drainage noted. Foul odor after cleansing was noted. The wound margin is distinct with the outline attached to  the wound base. There is large (67-100%) red granulation within the wound bed. There is a small (1-33%) amount of necrotic tissue within the wound bed including Eschar and Adherent Slough. The periwound skin appearance exhibited: Scarring, Erythema. The  surrounding wound skin color is noted with erythema which is circumferential. Periwound temperature was noted as No Abnormality. The periwound has tenderness on palpation. Wound #2 status is Open. Original cause of wound was Gradually Appeared. The wound is located on the Right,Posterior Lower Leg. The wound measures 12.7cm length x 11.5cm width x 0.3cm depth; 114.707cm^2 area and 34.412cm^3 volume. There is no tunneling or undermining noted. There is a large amount of purulent drainage noted. The wound margin is distinct with the outline attached to the wound base. The periwound skin appearance exhibited: Scarring, Erythema. The surrounding wound skin color is noted with erythema which is circumferential. Periwound temperature was noted as No Abnormality. The periwound has tenderness on palpation. NYALA, KIRCHNER F. ([id number]) Wound #3 status is Open. Original cause of wound was Gradually Appeared. The wound is located on the Right,Lateral Calcaneus. The wound measures 1.1cm length x 1cm width x 0.1cm depth; 0.864cm^2 area and 0.086cm^3 volume. There is no tunneling or undermining noted. There is a none present amount of drainage noted. The wound margin is flat and intact. There is no granulation within the wound bed. There is a large (67-100%) amount of necrotic tissue within the wound bed including Eschar. Periwound temperature was noted as No Abnormality. The periwound has tenderness on palpation. 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 There was a Excisional Skin/Subcutaneous Tissue Debridement with a total area of 44.16 sq cm performed by Ricard Dillon, MD. With the following instrument(s): Blade, and Forceps to remove Viable and Non-Viable tissue/material. Material removed includes Fat, Eschar, Subcutaneous Tissue, and Slough after achieving pain control using Other (lidocaine 4%). No specimens were taken. A time out was conducted at 13:41, prior to the start of the procedure. A Large amount of bleeding was controlled with Pressure. The procedure was tolerated well with a pain level of 3 throughout and a pain level of 1 following the procedure. Patient s Level of Consciousness post procedure was recorded as Awake and Alert. Post Debridement Measurements: 9.6cm length x 4.6cm width x 0.4cm depth; 13.873cm^3 volume. Character of Wound/Ulcer Post Debridement requires further debridement. Severity of Tissue Post Debridement is: Fat layer exposed. 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 There was a Excisional Skin/Subcutaneous Tissue Debridement with a total area of 36 sq cm performed by Ricard Dillon, MD. With the following instrument(s): Blade, and Forceps to remove Viable and Non-Viable tissue/material. Material removed includes Fat, Eschar, Subcutaneous Tissue, and Slough after achieving pain control using Other (lidocaine 4%). No specimens were taken. A time out was conducted at 13:41, prior to the start of the procedure. A Large amount of bleeding was controlled with Pressure. The procedure was tolerated well  with a pain level of 3 throughout and a pain level of 1 following the procedure. Patient s Level of Consciousness post procedure was recorded as Awake and Alert. Post Debridement  Measurements: 12.5cm length x 11.5cm width x 0.3cm depth; 33.87cm^3 volume. Character of Wound/Ulcer Post Debridement requires further debridement. Severity of Tissue Post Debridement is: Fat layer exposed. Post procedure Diagnosis Wound #2: Same as Pre-Procedure [name], Dana F. ([id number]) Plan Wound Cleansing: Wound #1 Right,Anterior Lower Leg: Clean wound with Normal Saline. Wound #2 Right,Posterior Lower Leg: Clean wound with Normal Saline. Wound #3 Right,Lateral Calcaneus: Clean wound with Normal Saline. Anesthetic (add to Medication List): Wound #1 Right,Anterior Lower Leg: Topical Lidocaine 4% cream applied to wound bed prior to debridement (In Clinic Only). Wound #2 Right,Posterior Lower Leg: Topical Lidocaine 4% cream applied to wound bed prior to debridement (In Clinic Only). Wound #3 Right,Lateral Calcaneus: Topical Lidocaine 4% cream applied to wound bed prior to debridement (In Clinic Only). Primary Wound Dressing: Wound #1 Right,Anterior Lower Leg: Silver Alginate Wound #2 Right,Posterior Lower Leg: Silver Alginate Wound #3 Right,Lateral Calcaneus: Silver Alginate Secondary Dressing: Wound #1 Right,Anterior Lower Leg: Other - Keramax or equal Wound #2 Right,Posterior Lower Leg: Other - Keramax or equal Wound #3 Right,Lateral Calcaneus: Other - Keramax or equal Dressing Change Frequency: Wound #1 Right,Anterior Lower Leg: Change Dressing Monday, Wednesday, Friday Wound #2 Right,Posterior Lower Leg: Change Dressing Monday, Wednesday, Friday Wound #3 Right,Lateral Calcaneus: Change Dressing Monday, Wednesday, Friday Follow-up Appointments: Wound #1 Right,Anterior Lower Leg: Return Appointment in 1 week. Wound #2 Right,Posterior Lower Leg: Return Appointment in 1 week. Wound #3 Right,Lateral Calcaneus: Return Appointment in 1 week. Edema Control: Wound #1 Right,Anterior Lower Leg: Kerlix and Coban - Right Lower Extremity Elevate legs to the level of  the heart and pump ankles as often as possible Wound #2 Right,Posterior Lower Leg: Kerlix and Coban - Right Lower Extremity Elevate legs to the level of the heart and pump ankles as often as possible Wound #3 Right,Lateral Calcaneus: Kerlix and Coban - Right Lower Extremity Elevate legs to the level of the heart and pump ankles as often as possible Home Health: Wound #1 Right,Anterior Lower Leg: ASHTEN, SARNOWSKI ([id number]) [place] Nurse may visit PRN to address patient s wound care needs. FACE TO FACE ENCOUNTER: MEDICARE and MEDICAID PATIENTS: I certify that this patient is under my care and that I had a face-to-face encounter that meets the physician face-to-face encounter requirements with this patient on this date. The encounter with the patient was in whole or in part for the following MEDICAL CONDITION: (primary reason for [place]) MEDICAL NECESSITY: I certify, that based on my findings, NURSING services are a medically necessary home health service. HOME BOUND STATUS: I certify that my clinical findings support that this patient is homebound (i.e., Due to illness or injury, pt requires aid of supportive devices such as crutches, cane, wheelchairs, walkers, the use of special transportation or the assistance of another person to leave their place of residence. There is a normal inability to leave the home and doing so requires considerable and taxing effort. Other absences are for medical reasons / religious services and are infrequent or of short duration when for other reasons). If current dressing causes regression in wound condition, may D/C ordered dressing product/s and apply Normal Saline Moist Dressing daily until next [place] / Other MD appointment. [place] of regression in wound condition at [phone]. Wound #2  Right,Posterior Lower Leg: Continue Home Health Visits - [place] Nurse may visit PRN to address patient s wound care needs. FACE TO FACE ENCOUNTER: MEDICARE and MEDICAID PATIENTS: I certify that this patient is under my care and that I had a face-to-face encounter that meets the physician face-to-face encounter requirements with this patient on this date. The encounter with the patient was in whole or in part for the following MEDICAL CONDITION: (primary reason for [place]) MEDICAL NECESSITY: I certify, that based on my findings, NURSING services are a medically necessary home health service. HOME BOUND STATUS: I certify that my clinical findings support that this patient is homebound (i.e., Due to illness or injury, pt requires aid of supportive devices such as crutches, cane, wheelchairs, walkers, the use of special transportation or the assistance of another person to leave their place of residence. There is a normal inability to leave the home and doing so requires considerable and taxing effort. Other absences are for medical reasons / religious services and are infrequent or of short duration when for other reasons). If current dressing causes regression in wound condition, may D/C ordered dressing product/s and apply Normal Saline Moist Dressing daily until next [place] / Other MD appointment. [place] of regression in wound condition at [phone]. Wound #3 Right,Lateral Calcaneus: Continue Home Health Visits - [place] Nurse may visit PRN to address patient s wound care needs. FACE TO FACE ENCOUNTER: MEDICARE and MEDICAID PATIENTS: I certify that this patient is under my care and that I had a face-to-face encounter that meets the physician face-to-face encounter requirements with this patient on this date. The encounter with the patient was in whole or in part for the following MEDICAL CONDITION: (primary reason for [place]) MEDICAL NECESSITY: I certify, that based on my findings,  NURSING services are a medically necessary home health service. HOME BOUND STATUS: I certify that my clinical findings support that this patient is homebound (i.e., Due to illness or injury, pt requires aid of supportive devices such as crutches, cane, wheelchairs, walkers, the use of special transportation or the assistance of another person to leave their place of residence. There is a normal inability to leave the home and doing so requires considerable and taxing effort. Other absences are for medical reasons / religious services and are infrequent or of short duration when for other reasons). If current dressing causes regression in wound condition, may D/C ordered dressing product/s and apply Normal Saline Moist Dressing daily until next [place] / Other MD appointment. [place] of regression in wound condition at [phone]. Consults ordered were: Dermatology - [facility] Dermatology General Notes: Patient has appointment with Vascular today #1 substantial wounds on the right lower extremity mid calf area as noted. Aggressive debridement done today. Although these are advertised is being definitively secondary to calciphylaxis 2 biopsies do not definitively show this alternative differential diagnoses were provided including pyogenic granuloma. Stains for Kaposi's sarcoma were negative. PAS stain was negative for fungus. I'm aware that some physicians do not favor debridement with definitive calciphylaxis I don't follow this in my practice. the wound will need to be debrided Any chance that he'll besides started this today GENNETTE, SHADIX F. ([id number]) #2 all use silver alginate for now under Kerlix Coban compression. I would like to use Santyl although this may be cost prohibitive. I put this through her pharmacy to see what that direct cost would be #3 have her  seen by dermatology at [facility]. I'm hopeful they can overview the pathology #4 on the right foot  there is an ischemic looking area on the right lateral heel as well as the dorsal aspect of several of her toes although she is not advertising is having macrovascular disease. Electronic Signature(s) Signed: 03/10/2018 3:47:31 PM By: Linton Ham MD Entered By: Linton Ham on 03/10/2018 15:47:31 [name] ([id number]) -------------------------------------------------------------------------------- ROS/PFSH Details Patient Name: [name]. Date of Service: 03/10/2018 12:30 PM Medical Record Number: [id number] Patient Account Number: [id number] Date of Birth/Sex: November 16, 1945 (72 y.o. Female) Treating RN: Carolyne Fiscal, Debi Primary Care Provider: Sharilyn Sites Other Clinician: Referring Provider: Eleonore Chiquito Treating Provider/Extender: Tito Dine in Treatment: 0 Information Obtained From Patient Wound History Do you currently have one or more open woundso Yes How many open wounds do you currently haveo 1 Approximately how long have you had your woundso oct 2018 How have you been treating your wound(s) until nowo betadine, bandage Has your wound(s) ever healed and then re-openedo No Have you had any lab work done in the past montho No Have you tested positive for an antibiotic resistant organism (MRSA, VRE)o No Have you tested positive for osteomyelitis (bone infection)o No Have you had any tests for circulation on your legso No Have you had other problems associated with your woundso Swelling Genitourinary Complaints and Symptoms: Positive for: Little failure/ Dialysis Medical History: Positive for: End Stage Renal Disease Integumentary (Skin) Complaints and Symptoms: Positive for: Wounds Constitutional Symptoms (General Health) Complaints and Symptoms: No Complaints or Symptoms Eyes Medical History: Positive for: Cataracts - surgery Ear/Nose/Mouth/Throat Complaints and Symptoms: No Complaints or Symptoms Hematologic/Lymphatic Medical  History: Positive for: Anemia Respiratory [name], Dana Gwendolyn F. ([id number]) Medical History: Positive for: Chronic Obstructive Pulmonary Disease (COPD) Cardiovascular Medical History: Positive for: Arrhythmia - a-fib; Hypertension Gastrointestinal Complaints and Symptoms: No Complaints or Symptoms Endocrine Medical History: Positive for: Type II Diabetes Time with diabetes: 75 yrs Treated with: Insulin Blood sugar tested every day: Yes Tested : Immunological Complaints and Symptoms: No Complaints or Symptoms Musculoskeletal Complaints and Symptoms: Review of System Notes: lupus Medical History: Positive for: Gout; Rheumatoid Arthritis Neurologic Complaints and Symptoms: No Complaints or Symptoms Oncologic Complaints and Symptoms: No Complaints or Symptoms Psychiatric Complaints and Symptoms: No Complaints or Symptoms HBO Extended History Items Eyes: Cataracts Immunizations Pneumococcal Vaccine: Received Pneumococcal Vaccination: Yes ASHMI, BLAS ([id number]) Implantable Devices Family and Social History Cancer: No; Diabetes: Yes - Siblings; Heart Disease: No; Hereditary Spherocytosis: No; Hypertension: Yes - Siblings,Father,Mother; Little Disease: Yes - Siblings; Lung Disease: No; Seizures: No; Stroke: No; Thyroid Problems: No; Tuberculosis: No; Former smoker - 10 yrs ago; Marital Status - Married; Alcohol Use: Never; Drug Use: No History; Caffeine Use: Never; Financial Concerns: No; Food, Clothing or Shelter Needs: No; Support System Lacking: No; Transportation Concerns: No; Advanced Directives: No; Patient does not want information on Advanced Directives; Do not resuscitate: No; Living Will: No; Medical Power of Attorney: No Electronic Signature(s) Signed: 03/10/2018 3:54:16 PM By: Alric Quan Signed: 03/10/2018 4:49:36 PM By: Linton Ham MD Entered By: Alric Quan on 03/10/2018 13:03:27 [name]  ([id number]) -------------------------------------------------------------------------------- [name] Details Patient Name: [name]. Date of Service: 03/10/2018 Medical Record Number: [id number] Patient Account Number: [id number] Date of Birth/Sex: 03/26/1946 (72 y.o. 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 Little I13.2 disease, or end stage renal disease Facility Procedures CPT4 Code: 31540086 Description: 76195 - WOUND CARE VISIT-LEV 3 EST PT Modifier: Quantity: 1 CPT4 Code: 09326712 Description: 45809 - DEB SUBQ TISSUE 20 SQ CM/< ICD-10 Diagnosis Description L97.213 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 Modifier: scle Quantity: 1 CPT4 Code: 98338250 Description: 53976 - DEB SUBQ TISS EA ADDL 20CM ICD-10 Diagnosis Description B34.193 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 Modifier: scle Quantity: 4 Physician Procedures CPT4 Code: 7902409 Description: 73532 - WC PHYS LEVEL 4 - NEW PT ICD-10 Diagnosis Description D92.426 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 ([id number]) Modifier: scle Quantity: 4 Electronic Signature(s) Signed: 03/10/2018 4:49:36 PM By: Linton Ham MD Entered By: Linton Ham on 03/10/2018 15:48:32

## 2018-03-14 NOTE — Anesthesia Preprocedure Evaluation (Addendum)
Anesthesia Evaluation  Patient identified by MRN, date of birth, ID band Patient awake    Reviewed: Allergy & Precautions, H&P , NPO status , Patient's Chart, lab work & pertinent test results  Airway Mallampati: II  TM Distance: >3 FB Neck ROM: Full    Dental no notable dental hx. (+) Edentulous Lower, Edentulous Upper, Dental Advisory Given   Pulmonary asthma , COPD,  COPD inhaler, former smoker,    Pulmonary exam normal breath sounds clear to auscultation       Cardiovascular Exercise Tolerance: Good hypertension, Pt. on medications and Pt. on home beta blockers + Peripheral Vascular Disease and +CHF   Rhythm:Regular Rate:Normal     Neuro/Psych negative neurological ROS  negative psych ROS   GI/Hepatic negative GI ROS, Neg liver ROS,   Endo/Other  diabetes, Insulin Dependent  Renal/GU ESRF and DialysisRenal disease  negative genitourinary   Musculoskeletal  (+) Arthritis , Osteoarthritis,    Abdominal   Peds  Hematology negative hematology ROS (+) anemia ,   Anesthesia Other Findings   Reproductive/Obstetrics negative OB ROS                           Anesthesia Physical Anesthesia Plan  ASA: III  Anesthesia Plan: General   Post-op Pain Management:    Induction: Intravenous  PONV Risk Score and Plan: 4 or greater and Ondansetron, Dexamethasone and Treatment may vary due to age or medical condition  Airway Management Planned: LMA  Additional Equipment:   Intra-op Plan:   Post-operative Plan: Extubation in OR  Informed Consent: I have reviewed the patients History and Physical, chart, labs and discussed the procedure including the risks, benefits and alternatives for the proposed anesthesia with the patient or authorized representative who has indicated his/her understanding and acceptance.   Dental advisory given  Plan Discussed with: CRNA  Anesthesia Plan Comments:          Anesthesia Quick Evaluation

## 2018-03-15 ENCOUNTER — Ambulatory Visit (HOSPITAL_COMMUNITY): Payer: Medicare Other | Admitting: Anesthesiology

## 2018-03-15 ENCOUNTER — Encounter (HOSPITAL_COMMUNITY): Payer: Self-pay

## 2018-03-15 ENCOUNTER — Ambulatory Visit (HOSPITAL_COMMUNITY)
Admission: RE | Admit: 2018-03-15 | Discharge: 2018-03-15 | Disposition: A | Discharge status: Home / Self Care | Payer: Medicare Other | Source: Ambulatory Visit | Attending: Vascular Surgery | Admitting: Vascular Surgery

## 2018-03-15 ENCOUNTER — Other Ambulatory Visit: Payer: Self-pay

## 2018-03-15 ENCOUNTER — Telehealth: Payer: Self-pay | Admitting: Vascular Surgery

## 2018-03-15 ENCOUNTER — Encounter (HOSPITAL_COMMUNITY)
Admission: RE | Disposition: A | Discharge status: Home / Self Care | Payer: Self-pay | Source: Ambulatory Visit | Attending: Vascular Surgery

## 2018-03-15 DIAGNOSIS — J449 Chronic obstructive pulmonary disease, unspecified: Secondary | ICD-10-CM | POA: Diagnosis not present

## 2018-03-15 DIAGNOSIS — Z7952 Long term (current) use of systemic steroids: Secondary | ICD-10-CM | POA: Insufficient documentation

## 2018-03-15 DIAGNOSIS — M329 Systemic lupus erythematosus, unspecified: Secondary | ICD-10-CM | POA: Diagnosis not present

## 2018-03-15 DIAGNOSIS — I4891 Unspecified atrial fibrillation: Secondary | ICD-10-CM | POA: Diagnosis not present

## 2018-03-15 DIAGNOSIS — I509 Heart failure, unspecified: Secondary | ICD-10-CM | POA: Diagnosis not present

## 2018-03-15 DIAGNOSIS — N186 End stage renal disease: Secondary | ICD-10-CM | POA: Diagnosis not present

## 2018-03-15 DIAGNOSIS — Z79899 Other long term (current) drug therapy: Secondary | ICD-10-CM | POA: Insufficient documentation

## 2018-03-15 DIAGNOSIS — Y832 Surgical operation with anastomosis, bypass or graft as the cause of abnormal reaction of the patient, or of later complication, without mention of misadventure at the time of the procedure: Secondary | ICD-10-CM | POA: Insufficient documentation

## 2018-03-15 DIAGNOSIS — Z7901 Long term (current) use of anticoagulants: Secondary | ICD-10-CM | POA: Insufficient documentation

## 2018-03-15 DIAGNOSIS — E1151 Type 2 diabetes mellitus with diabetic peripheral angiopathy without gangrene: Secondary | ICD-10-CM | POA: Insufficient documentation

## 2018-03-15 DIAGNOSIS — M109 Gout, unspecified: Secondary | ICD-10-CM | POA: Diagnosis not present

## 2018-03-15 DIAGNOSIS — M150 Primary generalized (osteo)arthritis: Secondary | ICD-10-CM | POA: Diagnosis not present

## 2018-03-15 DIAGNOSIS — L03115 Cellulitis of right lower limb: Secondary | ICD-10-CM | POA: Diagnosis not present

## 2018-03-15 DIAGNOSIS — I132 Hypertensive heart and chronic kidney disease with heart failure and with stage 5 chronic kidney disease, or end stage renal disease: Secondary | ICD-10-CM | POA: Insufficient documentation

## 2018-03-15 DIAGNOSIS — E1122 Type 2 diabetes mellitus with diabetic chronic kidney disease: Secondary | ICD-10-CM | POA: Insufficient documentation

## 2018-03-15 DIAGNOSIS — I5032 Chronic diastolic (congestive) heart failure: Secondary | ICD-10-CM | POA: Diagnosis not present

## 2018-03-15 DIAGNOSIS — I872 Venous insufficiency (chronic) (peripheral): Secondary | ICD-10-CM | POA: Diagnosis not present

## 2018-03-15 DIAGNOSIS — Z87891 Personal history of nicotine dependence: Secondary | ICD-10-CM | POA: Insufficient documentation

## 2018-03-15 DIAGNOSIS — Z794 Long term (current) use of insulin: Secondary | ICD-10-CM | POA: Diagnosis not present

## 2018-03-15 DIAGNOSIS — Z992 Dependence on renal dialysis: Secondary | ICD-10-CM | POA: Diagnosis not present

## 2018-03-15 DIAGNOSIS — H409 Unspecified glaucoma: Secondary | ICD-10-CM | POA: Diagnosis not present

## 2018-03-15 DIAGNOSIS — T829XXA Unspecified complication of cardiac and vascular prosthetic device, implant and graft, initial encounter: Secondary | ICD-10-CM | POA: Diagnosis present

## 2018-03-15 DIAGNOSIS — T82838A Hemorrhage of vascular prosthetic devices, implants and grafts, initial encounter: Secondary | ICD-10-CM | POA: Diagnosis not present

## 2018-03-15 DIAGNOSIS — Z6835 Body mass index (BMI) 35.0-35.9, adult: Secondary | ICD-10-CM

## 2018-03-15 DIAGNOSIS — I97638 Postprocedural hematoma of a circulatory system organ or structure following other circulatory system procedure: Secondary | ICD-10-CM | POA: Diagnosis not present

## 2018-03-15 DIAGNOSIS — E785 Hyperlipidemia, unspecified: Secondary | ICD-10-CM | POA: Diagnosis not present

## 2018-03-15 HISTORY — DX: Unspecified open wound, right lower leg, initial encounter: S81.801A

## 2018-03-15 HISTORY — PX: REVISON OF ARTERIOVENOUS FISTULA: SHX6074

## 2018-03-15 LAB — POCT I-STAT 4, (NA,K, GLUC, HGB,HCT)
GLUCOSE: 95 mg/dL (ref 65–99)
HEMATOCRIT: 39 % (ref 36.0–46.0)
Hemoglobin: 13.3 g/dL (ref 12.0–15.0)
Potassium: 4.4 mmol/L (ref 3.5–5.1)
Sodium: 132 mmol/L — ABNORMAL LOW (ref 135–145)

## 2018-03-15 LAB — GLUCOSE, CAPILLARY: GLUCOSE-CAPILLARY: 89 mg/dL (ref 65–99)

## 2018-03-15 LAB — PROTIME-INR
INR: 1.05
PROTHROMBIN TIME: 13.6 s (ref 11.4–15.2)

## 2018-03-15 SURGERY — REVISON OF ARTERIOVENOUS FISTULA
Anesthesia: General | Site: Arm Upper | Laterality: Left

## 2018-03-15 MED ORDER — SODIUM CHLORIDE 0.9 % IV SOLN: INTRAVENOUS | Status: DC

## 2018-03-15 MED ORDER — CEFAZOLIN SODIUM-DEXTROSE 2-4 GM/100ML-% IV SOLN
INTRAVENOUS | Status: AC
  Filled 2018-03-15: qty 100

## 2018-03-15 MED ORDER — SODIUM CHLORIDE 0.9 % IV SOLN
INTRAVENOUS | Status: DC | PRN
  Administered 2018-03-15: 500 mL

## 2018-03-15 MED ORDER — FENTANYL CITRATE (PF) 100 MCG/2ML IJ SOLN
INTRAMUSCULAR | Status: DC | PRN
  Administered 2018-03-15: 50 ug via INTRAVENOUS

## 2018-03-15 MED ORDER — LIDOCAINE HCL (PF) 1 % IJ SOLN
INTRAMUSCULAR | Status: AC
  Filled 2018-03-15: qty 30

## 2018-03-15 MED ORDER — FENTANYL CITRATE (PF) 250 MCG/5ML IJ SOLN
INTRAMUSCULAR | Status: AC
  Filled 2018-03-15: qty 5

## 2018-03-15 MED ORDER — HEPARIN SODIUM (PORCINE) 1000 UNIT/ML IJ SOLN
1600.0000 [IU] | Freq: Once | INTRAMUSCULAR | Status: AC
  Administered 2018-03-15: 1600 [IU] via INTRAVENOUS

## 2018-03-15 MED ORDER — PROPOFOL 10 MG/ML IV BOLUS
INTRAVENOUS | Status: DC | PRN
Start: 2018-03-15 — End: 2018-03-15
  Administered 2018-03-15: 90 mg via INTRAVENOUS

## 2018-03-15 MED ORDER — SODIUM CHLORIDE 0.9 % IR SOLN
Status: DC | PRN
  Administered 2018-03-15: 1000 mL

## 2018-03-15 MED ORDER — PROPOFOL 10 MG/ML IV BOLUS
INTRAVENOUS | Status: AC
  Filled 2018-03-15: qty 20

## 2018-03-15 MED ORDER — DEXAMETHASONE SODIUM PHOSPHATE 10 MG/ML IJ SOLN
INTRAMUSCULAR | Status: DC | PRN
  Administered 2018-03-15: 10 mg via INTRAVENOUS

## 2018-03-15 MED ORDER — CEFAZOLIN SODIUM-DEXTROSE 2-4 GM/100ML-% IV SOLN
2.0000 g | INTRAVENOUS | Status: AC
  Administered 2018-03-15: 2 g via INTRAVENOUS

## 2018-03-15 MED ORDER — SODIUM CHLORIDE 0.9 % IV SOLN
INTRAVENOUS | Status: DC | PRN
  Administered 2018-03-15 (×2): via INTRAVENOUS

## 2018-03-15 MED ORDER — LIDOCAINE-EPINEPHRINE (PF) 1 %-1:200000 IJ SOLN
INTRAMUSCULAR | Status: AC
  Filled 2018-03-15: qty 30

## 2018-03-15 MED ORDER — ONDANSETRON HCL 4 MG/2ML IJ SOLN
INTRAMUSCULAR | Status: DC | PRN
  Administered 2018-03-15: 4 mg via INTRAVENOUS

## 2018-03-15 MED ORDER — OXYCODONE HCL 5 MG PO TABS: 5.0000 mg | ORAL_TABLET | ORAL | 0 refills | Status: DC | PRN

## 2018-03-15 MED ORDER — SODIUM CHLORIDE 0.9 % IV SOLN
INTRAVENOUS | Status: AC
  Filled 2018-03-15: qty 1.2

## 2018-03-15 SURGICAL SUPPLY — 39 items
ADH SKN CLS APL DERMABOND .7 (GAUZE/BANDAGES/DRESSINGS) ×1
ARMBAND PINK RESTRICT EXTREMIT (MISCELLANEOUS) ×3 IMPLANT
BANDAGE ACE 4X5 VEL STRL LF (GAUZE/BANDAGES/DRESSINGS) ×2 IMPLANT
CANISTER SUCT 3000ML PPV (MISCELLANEOUS) ×3 IMPLANT
CANNULA VESSEL 3MM 2 BLNT TIP (CANNULA) ×3 IMPLANT
CLIP VESOCCLUDE MED 6/CT (CLIP) ×3 IMPLANT
CLIP VESOCCLUDE SM WIDE 6/CT (CLIP) ×3 IMPLANT
COVER PROBE W GEL 5X96 (DRAPES) ×2 IMPLANT
DERMABOND ADVANCED (GAUZE/BANDAGES/DRESSINGS) ×2
DERMABOND ADVANCED .7 DNX12 (GAUZE/BANDAGES/DRESSINGS) ×1 IMPLANT
ELECT REM PT RETURN 9FT ADLT (ELECTROSURGICAL) ×3
ELECTRODE REM PT RTRN 9FT ADLT (ELECTROSURGICAL) ×1 IMPLANT
GAUZE SPONGE 4X4 12PLY STRL LF (GAUZE/BANDAGES/DRESSINGS) ×2 IMPLANT
GLOVE BIO SURGEON STRL SZ 6 (GLOVE) ×4 IMPLANT
GLOVE BIO SURGEON STRL SZ7.5 (GLOVE) ×3 IMPLANT
GLOVE BIOGEL PI IND STRL 6 (GLOVE) IMPLANT
GLOVE BIOGEL PI IND STRL 7.0 (GLOVE) IMPLANT
GLOVE BIOGEL PI IND STRL 7.5 (GLOVE) IMPLANT
GLOVE BIOGEL PI IND STRL 8 (GLOVE) ×1 IMPLANT
GLOVE BIOGEL PI INDICATOR 6 (GLOVE) ×2
GLOVE BIOGEL PI INDICATOR 7.0 (GLOVE) ×2
GLOVE BIOGEL PI INDICATOR 7.5 (GLOVE) ×2
GLOVE BIOGEL PI INDICATOR 8 (GLOVE) ×2
GLOVE ECLIPSE 7.0 STRL STRAW (GLOVE) ×2 IMPLANT
GOWN STRL REUS W/ TWL LRG LVL3 (GOWN DISPOSABLE) ×3 IMPLANT
GOWN STRL REUS W/TWL LRG LVL3 (GOWN DISPOSABLE) ×9
KIT BASIN OR (CUSTOM PROCEDURE TRAY) ×3 IMPLANT
KIT TURNOVER KIT B (KITS) ×3 IMPLANT
NS IRRIG 1000ML POUR BTL (IV SOLUTION) ×3 IMPLANT
PACK CV ACCESS (CUSTOM PROCEDURE TRAY) ×3 IMPLANT
PAD ARMBOARD 7.5X6 YLW CONV (MISCELLANEOUS) ×6 IMPLANT
SPONGE SURGIFOAM ABS GEL 100 (HEMOSTASIS) IMPLANT
SUT PROLENE 6 0 BV (SUTURE) ×3 IMPLANT
SUT VIC AB 3-0 SH 27 (SUTURE) ×3
SUT VIC AB 3-0 SH 27X BRD (SUTURE) ×1 IMPLANT
SUT VICRYL 4-0 PS2 18IN ABS (SUTURE) ×3 IMPLANT
TOWEL GREEN STERILE (TOWEL DISPOSABLE) ×3 IMPLANT
UNDERPAD 30X30 (UNDERPADS AND DIAPERS) ×3 IMPLANT
WATER STERILE IRR 1000ML POUR (IV SOLUTION) ×3 IMPLANT

## 2018-03-15 NOTE — Transfer of Care (Signed)
Immediate Anesthesia Transfer of Care Note  Patient: [name]  Procedure(s) Performed: REVISION OF Left arm BASILIC VEIN TRANSPOSITION (Left Arm Upper)  Patient Location: PACU  Anesthesia Type:General  Level of Consciousness: awake, alert  and oriented  Airway & Oxygen Therapy: Patient Spontanous Breathing and Patient connected to nasal cannula oxygen  Post-op Assessment: Report given to RN, Post -op Vital signs reviewed and stable and Patient moving all extremities X 4  Post vital signs: Reviewed and stable  Last Vitals:  Vitals Value Taken Time  BP 160/48 03/15/2018  8:21 AM  Temp    Pulse 67 03/15/2018  8:21 AM  Resp 14 03/15/2018  8:21 AM  SpO2 92 % 03/15/2018  8:21 AM  Vitals shown include unvalidated device data.  Last Pain:  Vitals:   03/15/18 0624  TempSrc: Oral  PainSc:       Patients Stated Pain Goal: 2 (02/06/01 1117)  Complications: No apparent anesthesia complications

## 2018-03-15 NOTE — Anesthesia Postprocedure Evaluation (Signed)
Anesthesia Post Note  Patient: [name]  Procedure(s) Performed: REVISION OF Left arm BASILIC VEIN TRANSPOSITION (Left Arm Upper)     Patient location during evaluation: PACU Anesthesia Type: General Level of consciousness: awake and alert Pain management: pain level controlled Vital Signs Assessment: post-procedure vital signs reviewed and stable Respiratory status: spontaneous breathing, nonlabored ventilation and respiratory function stable Cardiovascular status: blood pressure returned to baseline and stable Postop Assessment: no apparent nausea or vomiting Anesthetic complications: no    Last Vitals:  Vitals:   03/15/18 0819 03/15/18 0830  BP: (!) 160/48 (!) 176/75  Pulse: 69 70  Resp: 16 17  Temp: 36.4 C   SpO2: 92% 100%    Last Pain:  Vitals:   03/15/18 0819  TempSrc:   PainSc: 0-No pain                 Azul Brumett,W. EDMOND

## 2018-03-15 NOTE — Telephone Encounter (Signed)
sch appt spk to pt husband 04/21/18 130pm wound check

## 2018-03-15 NOTE — Interval H&P Note (Signed)
History and Physical Interval Note:  03/15/2018 7:09 AM  [name]  has presented today for surgery, with the diagnosis of end stage renal disease  The various methods of treatment have been discussed with the patient and family. After consideration of risks, benefits and other options for treatment, the patient has consented to  Procedure(s): REVISION OF BASILIC VEIN TRANSPOSITION (Left) as a surgical intervention .  The patient's history has been reviewed, patient examined, no change in status, stable for surgery.  I have reviewed the patient's chart and labs.  Questions were answered to the patient's satisfaction.     [name]

## 2018-03-15 NOTE — Anesthesia Procedure Notes (Signed)
Procedure Name: LMA Insertion Date/Time: 03/15/2018 7:36 AM Performed by: Neldon Newport, CRNA Pre-anesthesia Checklist: Timeout performed, Patient being monitored, Suction available, Emergency Drugs available and Patient identified Patient Re-evaluated:Patient Re-evaluated prior to induction Oxygen Delivery Method: Circle system utilized Preoxygenation: Pre-oxygenation with 100% oxygen Induction Type: IV induction Ventilation: Mask ventilation without difficulty LMA: LMA inserted LMA Size: 4.0 Number of attempts: 1 Placement Confirmation: breath sounds checked- equal and bilateral and positive ETCO2 Tube secured with: Tape Dental Injury: Teeth and Oropharynx as per pre-operative assessment

## 2018-03-15 NOTE — Op Note (Signed)
    NAME: [name]    MRN: [id number] DOB: 02/17/1946    DATE OF OPERATION: 03/15/2018  PREOP DIAGNOSIS:    Exploration of left upper arm fistula and evacuation of hematoma  POSTOP DIAGNOSIS:    Same  PROCEDURE:    Exploration of left basilic vein transposition for chronic draining wound and evacuation of old hematoma  SURGEON: Judeth Cornfield. Scot Dock, MD, FACS  ASSIST: None  ANESTHESIA: General  EBL: Minimal  INDICATIONS:    [name] is a 72 y.o. female who had a second stage basilic vein transposition done by Dr. [name] on 01/13/2018.  The patient had some hematoma postoperatively and presented to the office with a  small wound overlying the fistula that I thought was at risk for bleeding.  She presents for exploration of the fistula.  FINDINGS:   The fistula had a good thrill.  Old hematoma was evacuated.  I closed the skin with a single layer of 4-0 and then apply gentle pressure with a 4 inch Ace to close the dead space from the old hematoma.  TECHNIQUE:   The patient was taken to the operating room and received a general anesthetic.  The left arm was prepped and draped in usual sterile fashion.  I excised an ellipse of skin where the wound was and this was carried down into the depths of the wound beneath the fistula where there was old hematoma.  The hematoma was evacuated and the wound irrigated.  The fistula was patent there was one small area that was bleeding adjacent to the fistula which was controlled with a 6-0 Prolene suture.  The wound was irrigated and hemostasis obtained.  The wound was then closed with a 4-0 Vicryl.  Sterile dressing was applied.  A 4 inch Ace was applied to apply some gentle pressure to close the dead space where the hematoma was.  CLINICAL NOTE: This fistula will not be ready for cannulation until mid to late July at the earliest.  I will see her back in the office in approximately 3 weeks.[name],  MD, FACS [facility]  DATE OF DICTATION:   03/15/2018

## 2018-03-16 ENCOUNTER — Encounter (HOSPITAL_COMMUNITY): Payer: Self-pay | Admitting: Vascular Surgery

## 2018-03-16 DIAGNOSIS — N186 End stage renal disease: Secondary | ICD-10-CM | POA: Diagnosis not present

## 2018-03-16 DIAGNOSIS — D631 Anemia in chronic kidney disease: Secondary | ICD-10-CM | POA: Diagnosis not present

## 2018-03-16 DIAGNOSIS — N2581 Secondary hyperparathyroidism of renal origin: Secondary | ICD-10-CM | POA: Diagnosis not present

## 2018-03-16 DIAGNOSIS — D509 Iron deficiency anemia, unspecified: Secondary | ICD-10-CM | POA: Diagnosis not present

## 2018-03-16 DIAGNOSIS — E876 Hypokalemia: Secondary | ICD-10-CM | POA: Diagnosis not present

## 2018-03-16 DIAGNOSIS — E1129 Type 2 diabetes mellitus with other diabetic kidney complication: Secondary | ICD-10-CM | POA: Diagnosis not present

## 2018-03-16 DIAGNOSIS — D689 Coagulation defect, unspecified: Secondary | ICD-10-CM | POA: Diagnosis not present

## 2018-03-17 ENCOUNTER — Encounter: Payer: Medicare Other | Admitting: Internal Medicine

## 2018-03-17 DIAGNOSIS — Z992 Dependence on renal dialysis: Secondary | ICD-10-CM | POA: Diagnosis not present

## 2018-03-17 DIAGNOSIS — I120 Hypertensive chronic kidney disease with stage 5 chronic kidney disease or end stage renal disease: Secondary | ICD-10-CM | POA: Diagnosis not present

## 2018-03-17 DIAGNOSIS — L89610 Pressure ulcer of right heel, unstageable: Secondary | ICD-10-CM | POA: Diagnosis not present

## 2018-03-17 DIAGNOSIS — E11622 Type 2 diabetes mellitus with other skin ulcer: Secondary | ICD-10-CM | POA: Diagnosis not present

## 2018-03-17 DIAGNOSIS — S81801A Unspecified open wound, right lower leg, initial encounter: Secondary | ICD-10-CM | POA: Diagnosis not present

## 2018-03-17 DIAGNOSIS — N186 End stage renal disease: Secondary | ICD-10-CM | POA: Diagnosis not present

## 2018-03-17 DIAGNOSIS — E1122 Type 2 diabetes mellitus with diabetic chronic kidney disease: Secondary | ICD-10-CM | POA: Diagnosis not present

## 2018-03-17 DIAGNOSIS — M793 Panniculitis, unspecified: Secondary | ICD-10-CM | POA: Diagnosis not present

## 2018-03-17 DIAGNOSIS — M329 Systemic lupus erythematosus, unspecified: Secondary | ICD-10-CM | POA: Diagnosis not present

## 2018-03-17 DIAGNOSIS — Z794 Long term (current) use of insulin: Secondary | ICD-10-CM | POA: Diagnosis not present

## 2018-03-18 ENCOUNTER — Other Ambulatory Visit: Payer: Self-pay | Admitting: "Endocrinology

## 2018-03-18 DIAGNOSIS — I4891 Unspecified atrial fibrillation: Secondary | ICD-10-CM | POA: Diagnosis not present

## 2018-03-18 DIAGNOSIS — E1122 Type 2 diabetes mellitus with diabetic chronic kidney disease: Secondary | ICD-10-CM | POA: Diagnosis not present

## 2018-03-18 DIAGNOSIS — N2581 Secondary hyperparathyroidism of renal origin: Secondary | ICD-10-CM | POA: Diagnosis not present

## 2018-03-18 DIAGNOSIS — L03115 Cellulitis of right lower limb: Secondary | ICD-10-CM | POA: Diagnosis not present

## 2018-03-18 DIAGNOSIS — N186 End stage renal disease: Secondary | ICD-10-CM | POA: Diagnosis not present

## 2018-03-18 DIAGNOSIS — E1129 Type 2 diabetes mellitus with other diabetic kidney complication: Secondary | ICD-10-CM | POA: Diagnosis not present

## 2018-03-18 DIAGNOSIS — J449 Chronic obstructive pulmonary disease, unspecified: Secondary | ICD-10-CM | POA: Diagnosis not present

## 2018-03-18 DIAGNOSIS — Z87891 Personal history of nicotine dependence: Secondary | ICD-10-CM | POA: Diagnosis not present

## 2018-03-18 DIAGNOSIS — D631 Anemia in chronic kidney disease: Secondary | ICD-10-CM | POA: Diagnosis not present

## 2018-03-18 DIAGNOSIS — Z992 Dependence on renal dialysis: Secondary | ICD-10-CM | POA: Diagnosis not present

## 2018-03-18 DIAGNOSIS — I872 Venous insufficiency (chronic) (peripheral): Secondary | ICD-10-CM | POA: Diagnosis not present

## 2018-03-18 DIAGNOSIS — E785 Hyperlipidemia, unspecified: Secondary | ICD-10-CM | POA: Diagnosis not present

## 2018-03-18 DIAGNOSIS — M150 Primary generalized (osteo)arthritis: Secondary | ICD-10-CM | POA: Diagnosis not present

## 2018-03-18 DIAGNOSIS — Z794 Long term (current) use of insulin: Secondary | ICD-10-CM | POA: Diagnosis not present

## 2018-03-18 DIAGNOSIS — M329 Systemic lupus erythematosus, unspecified: Secondary | ICD-10-CM | POA: Diagnosis not present

## 2018-03-18 DIAGNOSIS — D509 Iron deficiency anemia, unspecified: Secondary | ICD-10-CM | POA: Diagnosis not present

## 2018-03-18 DIAGNOSIS — E876 Hypokalemia: Secondary | ICD-10-CM | POA: Diagnosis not present

## 2018-03-18 DIAGNOSIS — D689 Coagulation defect, unspecified: Secondary | ICD-10-CM | POA: Diagnosis not present

## 2018-03-18 DIAGNOSIS — M109 Gout, unspecified: Secondary | ICD-10-CM | POA: Diagnosis not present

## 2018-03-18 DIAGNOSIS — H409 Unspecified glaucoma: Secondary | ICD-10-CM | POA: Diagnosis not present

## 2018-03-19 DIAGNOSIS — M150 Primary generalized (osteo)arthritis: Secondary | ICD-10-CM | POA: Diagnosis not present

## 2018-03-19 DIAGNOSIS — E785 Hyperlipidemia, unspecified: Secondary | ICD-10-CM | POA: Diagnosis not present

## 2018-03-19 DIAGNOSIS — E1122 Type 2 diabetes mellitus with diabetic chronic kidney disease: Secondary | ICD-10-CM | POA: Diagnosis not present

## 2018-03-19 DIAGNOSIS — I872 Venous insufficiency (chronic) (peripheral): Secondary | ICD-10-CM | POA: Diagnosis not present

## 2018-03-19 DIAGNOSIS — Z992 Dependence on renal dialysis: Secondary | ICD-10-CM | POA: Diagnosis not present

## 2018-03-19 DIAGNOSIS — M329 Systemic lupus erythematosus, unspecified: Secondary | ICD-10-CM | POA: Diagnosis not present

## 2018-03-19 DIAGNOSIS — Z794 Long term (current) use of insulin: Secondary | ICD-10-CM | POA: Diagnosis not present

## 2018-03-19 DIAGNOSIS — I4891 Unspecified atrial fibrillation: Secondary | ICD-10-CM | POA: Diagnosis not present

## 2018-03-19 DIAGNOSIS — N186 End stage renal disease: Secondary | ICD-10-CM | POA: Diagnosis not present

## 2018-03-19 DIAGNOSIS — M109 Gout, unspecified: Secondary | ICD-10-CM | POA: Diagnosis not present

## 2018-03-19 DIAGNOSIS — H409 Unspecified glaucoma: Secondary | ICD-10-CM | POA: Diagnosis not present

## 2018-03-19 DIAGNOSIS — J449 Chronic obstructive pulmonary disease, unspecified: Secondary | ICD-10-CM | POA: Diagnosis not present

## 2018-03-19 DIAGNOSIS — Z87891 Personal history of nicotine dependence: Secondary | ICD-10-CM | POA: Diagnosis not present

## 2018-03-19 NOTE — Progress Notes (Signed)
ZUZU, BEFORT ([id number]) Visit Report for 03/17/2018 Debridement Details Patient Name: [name], [name]. Date of Service: 03/17/2018 2:15 PM Medical Record Number: [id number] Patient Account Number: [id number] Date of Birth/Sex: 1945/11/27 (72 y.o. F) Treating RN: Cornell Barman Primary Care Provider: Sharilyn Sites Other Clinician: Referring Provider: Sharilyn Sites Treating Provider/Extender: Tito Dine in Treatment: 1 Debridement Performed for Wound #1 Right,Anterior Lower Leg Assessment: Performed By: Physician Ricard Dillon, MD Debridement Type: Debridement Severity of Tissue Pre Fat layer exposed Debridement: Pre-procedure Verification/Time Yes - 14:56 Out Taken: Start Time: 14:56 Pain Control: Lidocaine 4% Topical Solution Total Area Debrided (L x W): 5 (cm) x 6.5 (cm) = 32.5 (cm) Tissue and other material Viable, Non-Viable, Eschar, Slough, Subcutaneous, Slough debrided: Level: Skin/Subcutaneous Tissue Debridement Description: Excisional Instrument: Blade, Forceps Bleeding: Minimum Hemostasis Achieved: Pressure End Time: 14:57 Procedural Pain: 0 Post Procedural Pain: 0 Response to Treatment: Procedure was tolerated well Level of Consciousness: Awake and Alert Post Procedure Vitals: Temperature: 98.1 Pulse: 70 Respiratory Rate: 18 Blood Pressure: Systolic Blood Pressure: 330 Diastolic Blood Pressure: 87 Post Debridement Measurements of Total Wound Length: (cm) 10.5 Width: (cm) 6.5 Depth: (cm) 0.3 Volume: (cm) 16.081 Character of Wound/Ulcer Post Debridement: Improved Severity of Tissue Post Debridement: Fat layer exposed Post Procedure Diagnosis Same as Pre-procedure JOLYNN, BAJOREK ([id number]) Electronic Signature(s) Signed: 03/17/2018 4:07:49 PM By: Linton Ham MD Signed: 03/17/2018 4:19:21 PM By: Gretta Cool, BSN, RN, CWS, Kim RN, BSN Entered By: Linton Ham on 03/17/2018 15:12:39 [name]  ([id number]) -------------------------------------------------------------------------------- Debridement Details Patient Name: [name]. Date of Service: 03/17/2018 2:15 PM Medical Record Number: [id number] Patient Account Number: [id number] Date of Birth/Sex: 12/08/1945 (72 y.o. F) Treating RN: Cornell Barman Primary Care Provider: Sharilyn Sites Other Clinician: Referring Provider: Sharilyn Sites Treating Provider/Extender: Tito Dine in Treatment: 1 Debridement Performed for Wound #2 Right,Posterior Lower Leg Assessment: Performed By: Physician Ricard Dillon, MD Debridement Type: Debridement Severity of Tissue Pre Fat layer exposed Debridement: Pre-procedure Verification/Time Yes - 14:54 Out Taken: Start Time: 14:54 Pain Control: Lidocaine 4% Topical Solution Total Area Debrided (L x W): 12.4 (cm) x 10.5 (cm) = 130.2 (cm) Tissue and other material Viable, Non-Viable, Eschar, Slough, Subcutaneous, Slough debrided: Level: Skin/Subcutaneous Tissue Debridement Description: Excisional Instrument: Blade, Forceps Bleeding: Minimum Hemostasis Achieved: Pressure End Time: 14:54 Procedural Pain: 0 Post Procedural Pain: 0 Response to Treatment: Procedure was tolerated well Level of Consciousness: Awake and Alert Post Procedure Vitals: Temperature: 98.1 Pulse: 70 Respiratory Rate: 18 Blood Pressure: Systolic Blood Pressure: 811 Diastolic Blood Pressure: 87 Post Debridement Measurements of Total Wound Length: (cm) 12.4 Width: (cm) 10.5 Depth: (cm) 0.4 Volume: (cm) 40.904 Character of Wound/Ulcer Post Debridement: Improved Severity of Tissue Post Debridement: Fat layer exposed Post Procedure Diagnosis Same as Pre-procedure Electronic Signature(s) Signed: 03/17/2018 4:07:49 PM By: Linton Ham MD Signed: 03/17/2018 4:19:21 PM By: Gretta Cool, BSN, RN, CWS, Kim RN, BSN [place], [place] ([id number]) Entered By: Linton Ham on 03/17/2018  15:12:54 [name] ([id number]) -------------------------------------------------------------------------------- HPI Details Patient Name: [name]. Date of Service: 03/17/2018 2:15 PM Medical Record Number: [id number] Patient Account Number: [id number] Date of Birth/Sex: 13-Feb-1946 (72 y.o. F) Treating RN: Cornell Barman Primary Care Provider: Sharilyn Sites Other Clinician: Referring Provider: Sharilyn Sites Treating Provider/Extender: Tito Dine in Treatment: 1 History of Present Illness HPI Description: ADMISSION 03/10/18; This is a 72 year old woman with a history of end-stage renal disease on hemodialysis, type 2 diabetes on insulin with excellent control, systemic lupus.  Her problem began in October 2018 she developed a blister in an open wound on the right anterior tibial mid tibial area. This progressed and worsened until the tissue loss was substantial on the anterior and posterior right calf. she was followed in [place] by physical therapy at one point although I have not reviewed their notes. She apparently did not receive pulse lavage to the wound. I note that she was hospitalized from 03/01/18 through 03/03/18 predominantly for debridement of the wound by general surgery. She saw Dr. Arnoldo Morale who did not believe there was a role for surgical debridement. Was felt that definitive treatment would be amputation but the patient already been offered this by vein and vascular in [facility] and she refused arrangements were made for her to be sent to this clinic for review. The patient is already had a reasonably substantial review for this problem. she saw vein and vascular extensively over the course of this year.her noninvasive arterial studies done in December 2018 suggested a right superficial femoral artery occlusion/stenosis. She did not have any issues on the left. At that point her ABI on the right was 0.8 with monophasic waveforms 1.06 on the left.  However she had a velocity drop off in the superficial femoral artery. She went on to have diagnostic angiogram on 12/18/17 by Dr. Oneida Alar. This showed on the right that the common femoral, profunda femoris and superficial femoral arteries were all patent. There was a focal less than 1 mm length stenosis on the right superficial femoral artery otherwise the artery was widely patent. The right popliteal artery was widely patent. There was 3 vessel runoff via the anterior, tibial and paranoid Milta Deiters arteries to the right foot. She was not felt to have a vascular cause of these wounds. sHe's also had 2 separate biopsies. Firstly on 3/25 showed nonspecific findings with calciphylaxis not entirely excluded. No definite calcium deposits were seen within the dermis within the sections reviewed. Vascular features were nonspecific. Furthermore pyogenic gain granuloma may demonstrate similar findings. 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 Calciphylaxis was not favored in this specimen. There was a calcified vessel. I don't see a pathology specimen that specifically says definitive calciphylaxis. The patient had an x-ray of her right tibia while she is in hospital there was no osteomyelitis. She had an MRI of her foot in March that did not show osteomyelitis as well The patient has been using Betadine and wraps to the wounds. This is being done by a home health where they live in [place] 03/17/18; patient admitted to clinic last week. 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 Considerable amount of necrotic surface material that I removed from the posterior calf last week and again this week. The area anteriorly is far too adherent at this point. She lives in [place]. She has advanced home care home health. Dialyzes in  [place] as well. Electronic Signature(s) Signed: 03/17/2018 4:07:49 PM By: Linton Ham MD Entered By: Linton Ham on 03/17/2018 15:15:34 [name] ([id number]) -------------------------------------------------------------------------------- Physical Exam Details Patient Name: [name], [name]. Date of Service: 03/17/2018 2:15 PM Medical Record Number: [id number] Patient Account Number: [id number] Date of Birth/Sex: December 03, 1945 (72 y.o. F) Treating RN: Cornell Barman Primary Care Provider: Sharilyn Sites Other Clinician: Referring Provider: Sharilyn Sites Treating Provider/Extender: Tito Dine in Treatment: 1 Constitutional Patient is hypertensive.. Pulse regular  and within target range for patient.Marland Kitchen Respirations regular, non-labored and within target range.. Temperature is normal and within the target range for the patient.Marland Kitchen appears in no distress. Cardiovascular Pedal pulses faintly palpable at the right dorsalis pedis and right posterior tibial. Notes wound exam oThe patient has substantial wounds on the right posterior calf and also on the right anterior tibial area. oUsing a #10 scalpel and pickups I'm removing as much necrotic debris from the posterior wound is possible. Tightly adherent necrotic debris anteriorly although a large amount of the anterior wound I wouldn't debride at present. oThere is no clear evidence of infection. She probably has some degree of chronic venous insufficiency as well Electronic Signature(s) Signed: 03/17/2018 4:07:49 PM By: Linton Ham MD Entered By: Linton Ham on 03/17/2018 15:17:41 [name] ([id number]) -------------------------------------------------------------------------------- Physician Orders Details Patient Name: [name]. Date of Service: 03/17/2018 2:15 PM Medical Record Number: [id number] Patient Account Number: [id number] Date of Birth/Sex: 1946-01-05 (72 y.o. F) Treating  RN: Cornell Barman Primary Care Provider: Sharilyn Sites Other Clinician: Referring Provider: Sharilyn Sites Treating Provider/Extender: Tito Dine in Treatment: 1 Verbal / Phone Orders: No Diagnosis Coding Wound Cleansing Wound #1 Right,Anterior Lower Leg o Clean wound with Normal Saline. Wound #2 Right,Posterior Lower Leg o Clean wound with Normal Saline. Wound #3 Right,Lateral Calcaneus o Clean wound with Normal Saline. Anesthetic (add to Medication List) Wound #1 Right,Anterior Lower Leg o Topical Lidocaine 4% cream applied to wound bed prior to debridement (In Clinic Only). Wound #2 Right,Posterior Lower Leg o Topical Lidocaine 4% cream applied to wound bed prior to debridement (In Clinic Only). Wound #3 Right,Lateral Calcaneus o Topical Lidocaine 4% cream applied to wound bed prior to debridement (In Clinic Only). Primary Wound Dressing Wound #2 Right,Posterior Lower Leg o Silver Alginate Wound #3 Right,Lateral Calcaneus o Silver Alginate Wound #1 Right,Anterior Lower Leg o Santyl Ointment - black areas only Secondary Dressing Wound #1 Right,Anterior Lower Leg o Other - Keramax or equal Wound #2 Right,Posterior Lower Leg o Other - Keramax or equal Wound #3 Right,Lateral Calcaneus o Other - Keramax or equal Dressing Change Frequency Wound #1 Right,Anterior Lower Leg o Change Dressing Monday, Wednesday, Friday PATRICIE, GEESLIN. ([id number]) Wound #2 Right,Posterior Lower Leg o Change Dressing Monday, Wednesday, Friday Wound #3 Right,Lateral Calcaneus o Change Dressing Monday, Wednesday, Friday Follow-up Appointments Wound #1 Right,Anterior Lower Leg o Return Appointment in 2 weeks. - Patient has endoscopy next week. Wound #2 Right,Posterior Lower Leg o Return Appointment in 2 weeks. - Patient has endoscopy next week. Wound #3 Right,Lateral Calcaneus o Return Appointment in 2 weeks. - Patient has endoscopy next  week. Edema Control Wound #1 Right,Anterior Lower Leg o Kerlix and Coban - Right Lower Extremity o Elevate legs to the level of the heart and pump ankles as often as possible Wound #2 Right,Posterior Lower Leg o Kerlix and Coban - Right Lower Extremity o Elevate legs to the level of the heart and pump ankles as often as possible Wound #3 Right,Lateral Calcaneus o Kerlix and Coban - Right Lower Extremity o Elevate legs to the level of the heart and pump ankles as often as possible Home Health Wound #1 Right,Anterior Lower Leg o [place] Visits - [place] Nurse may visit PRN to address patientos wound care needs. o FACE TO FACE ENCOUNTER: MEDICARE and MEDICAID PATIENTS: I certify that this patient is under my care and that I had a face-to-face encounter that meets the physician face-to-face encounter  requirements with this patient on this date. The encounter with the patient was in whole or in part for the following MEDICAL CONDITION: (primary reason for [place]) MEDICAL NECESSITY: I certify, that based on my findings, NURSING services are a medically necessary home health service. HOME BOUND STATUS: I certify that my clinical findings support that this patient is homebound (i.e., Due to illness or injury, pt requires aid of supportive devices such as crutches, cane, wheelchairs, walkers, the use of special transportation or the assistance of another person to leave their place of residence. There is a normal inability to leave the home and doing so requires considerable and taxing effort. Other absences are for medical reasons / religious services and are infrequent or of short duration when for other reasons). o If current dressing causes regression in wound condition, may D/C ordered dressing product/s and apply Normal Saline Moist Dressing daily until next [place] / Other MD appointment. [place] of  regression in wound condition at [phone]. Wound #2 [place] Visits - [place] Nurse may visit PRN to address patientos wound care needs. o FACE TO FACE ENCOUNTER: MEDICARE and MEDICAID PATIENTS: I certify that this patient is under my care and that I had a face-to-face encounter that meets the physician face-to-face encounter requirements with this patient on this date. The encounter with the patient was in whole or in part for the following MEDICAL CONDITION: (primary reason for [place]) MEDICAL NECESSITY: I certify, that based on my findings, NURSING services are a medically necessary home health service. HOME BOUND STATUS: I certify that my ILEE, RANDLEMAN ([id number]) clinical findings support that this patient is homebound (i.e., Due to illness or injury, pt requires aid of supportive devices such as crutches, cane, wheelchairs, walkers, the use of special transportation or the assistance of another person to leave their place of residence. There is a normal inability to leave the home and doing so requires considerable and taxing effort. Other absences are for medical reasons / religious services and are infrequent or of short duration when for other reasons). o If current dressing causes regression in wound condition, may D/C ordered dressing product/s and apply Normal Saline Moist Dressing daily until next [place] / Other MD appointment. [place] of regression in wound condition at [phone]. Wound #3 Right,Lateral Calcaneus o Continue Home Health Visits - [place] Nurse may visit PRN to address patientos wound care needs. o FACE TO FACE ENCOUNTER: MEDICARE and MEDICAID PATIENTS: I certify that this patient is under my care and that I had a face-to-face encounter that meets the physician face-to-face encounter requirements with this patient on this  date. The encounter with the patient was in whole or in part for the following MEDICAL CONDITION: (primary reason for [place]) MEDICAL NECESSITY: I certify, that based on my findings, NURSING services are a medically necessary home health service. HOME BOUND STATUS: I certify that my clinical findings support that this patient is homebound (i.e., Due to illness or injury, pt requires aid of supportive devices such as crutches, cane, wheelchairs, walkers, the use of special transportation or the assistance of another person to leave their place of residence. There is a normal inability to leave the home and doing so requires considerable and taxing effort. Other absences are for medical reasons / religious services and are infrequent or of short duration when for other reasons). o If  current dressing causes regression in wound condition, may D/C ordered dressing product/s and apply Normal Saline Moist Dressing daily until next [place] / Other MD appointment. [place] of regression in wound condition at [phone]. Medications-please add to medication list. Wound #1 Right,Anterior Lower Leg o Santyl Enzymatic Ointment Electronic Signature(s) Signed: 03/17/2018 4:07:49 PM By: Linton Ham MD Signed: 03/17/2018 4:19:21 PM By: Gretta Cool, BSN, RN, CWS, Kim RN, BSN Entered By: Gretta Cool, BSN, RN, CWS, Kim on 03/17/2018 15:02:53 [name] ([id number]) -------------------------------------------------------------------------------- Problem List Details Patient Name: [name], [name]. Date of Service: 03/17/2018 2:15 PM Medical Record Number: [id number] Patient Account Number: [id number] Date of Birth/Sex: 23-Jan-1946 (72 y.o. 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/10/2018 No Yes muscle 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 03/10/2018 No Yes E11.622 Type 2 diabetes mellitus with other skin ulcer 03/10/2018 No Yes L89.610 Pressure ulcer of right heel, unstageable 03/10/2018 No Yes I13.2 Hypertensive heart and chronic Little disease with heart 03/10/2018 No Yes failure and with stage 5 chronic Little disease, or end stage renal disease Inactive Problems Resolved Problems Electronic Signature(s) Signed: 03/17/2018 4:07:49 PM By: Linton Ham MD Entered By: Linton Ham on 03/17/2018 15:12:13 [name] ([id number]) -------------------------------------------------------------------------------- Progress Note Details Patient Name: [name]. Date of Service: 03/17/2018 2:15 PM Medical Record Number: [id number] Patient Account Number: [id number] Date of Birth/Sex: [birth year] (72 y.o. F) Treating RN: Cornell Barman Primary Care Provider: Sharilyn Sites Other Clinician: Referring Provider: Sharilyn Sites Treating Provider/Extender: Tito Dine in Treatment: 1 Subjective History of Present Illness (HPI) ADMISSION 03/10/18; This is a 72 year old woman with a history of end-stage renal disease on hemodialysis, type 2 diabetes on insulin with excellent control, systemic lupus. Her problem began in October 2018 she developed a blister in an open wound on the right anterior tibial mid tibial area. This progressed and worsened until the tissue loss was substantial on the anterior and posterior right calf. she was followed in [place] by physical therapy at one point although I have not reviewed their notes. She apparently did not receive pulse lavage to the wound. I note that she was hospitalized from 03/01/18 through 03/03/18 predominantly for debridement of the wound by general surgery. She  saw Dr. Arnoldo Morale who did not believe there was a role for surgical debridement. Was felt that definitive treatment would be amputation but the patient already been offered this by vein and vascular in [facility] and she refused arrangements were made for her to be sent to this clinic for review. The patient is already had a reasonably substantial review for this problem. she saw vein and vascular extensively over the course of this year.her noninvasive arterial studies done in December 2018 suggested a right superficial femoral artery occlusion/stenosis. She did not have any issues on the left. At that point her ABI on the right was 0.8 with monophasic waveforms 1.06 on the left. However she had a velocity drop off in the superficial femoral artery. She went on to have diagnostic angiogram on 12/18/17 by Dr. Oneida Alar. This showed on the right that the common femoral, profunda femoris and superficial femoral arteries were all patent. There was a focal less than 1 mm length stenosis on the right superficial femoral artery otherwise the  artery was widely patent. The right popliteal artery was widely patent. There was 3 vessel runoff via the anterior, tibial and paranoid Milta Deiters arteries to the right foot. She was not felt to have a vascular cause of these wounds. sHe's also had 2 separate biopsies. Firstly on 3/25 showed nonspecific findings with calciphylaxis not entirely excluded. No definite calcium deposits were seen within the dermis within the sections reviewed. Vascular features were nonspecific. Furthermore pyogenic gain granuloma may demonstrate similar findings. 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 Calciphylaxis was not favored in this specimen. There was a calcified vessel. I don't see a pathology specimen that specifically says definitive calciphylaxis. The patient had an x-ray of  her right tibia while she is in hospital there was no osteomyelitis. She had an MRI of her foot in March that did not show osteomyelitis as well The patient has been using Betadine and wraps to the wounds. This is being done by a home health where they live in [place] 03/17/18; patient admitted to clinic last week. 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 Considerable amount of necrotic surface material that I removed from the posterior calf last week and again this week. The area anteriorly is far too adherent at this point. She lives in [place]. She has advanced home care home health. Dialyzes in [place] as well. CAROLANNE, MERCIER F. ([id number]) Objective Constitutional Patient is hypertensive.. Pulse regular and within target range for patient.Marland Kitchen Respirations regular, non-labored and within target range.. Temperature is normal and within the target range for the patient.Marland Kitchen appears in no distress. Vitals Time Taken: 2:24 PM, Height: 65 in, Weight: 164 lbs, BMI: 27.3, Temperature: 98.1 F, Pulse: 70 bpm, Respiratory Rate: 18 breaths/min, Blood Pressure: 144/87 mmHg. Cardiovascular Pedal pulses faintly palpable at the right dorsalis pedis and right posterior tibial. General Notes: wound exam The patient has substantial wounds on the right posterior calf and also on the right anterior tibial area. Using a #10 scalpel and pickups I'm removing as much necrotic debris from the posterior wound is possible. Tightly adherent necrotic debris anteriorly although a large amount of the anterior wound I wouldn't debride at present. There is no clear evidence of infection. She probably has some degree of chronic venous insufficiency as well Integumentary (Hair, Skin) Wound #1 status is Open. Original cause of wound was Gradually Appeared. The wound is located on the Right,Anterior Lower Leg. The wound measures 10.5cm length x  6.5cm width x 0.2cm depth; 53.603cm^2 area and 10.721cm^3 volume. Wound #2 status is Open. Original cause of wound was Gradually Appeared. The wound is located on the Right,Posterior Lower Leg. The wound measures 12.4cm length x 10.5cm width x 0.3cm depth; 102.259cm^2 area and 30.678cm^3 volume. Wound #3 status is Open. Original cause of wound was Gradually Appeared. The wound is located on the Right,Lateral Calcaneus. The wound measures 1cm length x 1cm width x 0.1cm depth; 0.785cm^2 area and 0.079cm^3 volume. 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 There was a Excisional Skin/Subcutaneous Tissue Debridement with a total area of 32.5 sq cm performed by Ricard Dillon, MD. With the following instrument(s): Blade, and Forceps to remove Viable and Non-Viable tissue/material. Material removed includes Eschar, Subcutaneous Tissue, and Slough after achieving pain control using Lidocaine 4% Topical Solution. No specimens were taken. A time out was conducted at 14:56, prior to the start of the KARALYNN, COTTONE. ([id number]) procedure. A Minimum amount of bleeding was controlled with Pressure. The procedure was tolerated well with a pain level of 0 throughout and a pain level of 0 following the procedure. Patient s Level of Consciousness post procedure was recorded as Awake and Alert. Post Debridement Measurements: 10.5cm length x 6.5cm width x 0.3cm depth; 16.081cm^3 volume. Character of Wound/Ulcer Post  Debridement is improved. Severity of Tissue Post Debridement is: Fat layer exposed. 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 There was a Excisional Skin/Subcutaneous Tissue Debridement with a total area of 130.2 sq cm performed by Ricard Dillon, MD. With the following instrument(s): Blade, and Forceps to remove Viable and Non-Viable tissue/material. Material removed includes Eschar, Subcutaneous Tissue, and Slough after achieving pain control using Lidocaine 4% Topical Solution. No specimens were taken. A time out was conducted at 14:54, prior to the start of the procedure. A Minimum amount of bleeding was controlled with Pressure. The procedure was tolerated well with a pain level of 0 throughout and a pain level of 0 following the procedure. Patient s Level of Consciousness post procedure was recorded as Awake and Alert. Post Debridement Measurements: 12.4cm length x 10.5cm width x 0.4cm depth; 40.904cm^3 volume. Character of Wound/Ulcer Post Debridement is improved. Severity of Tissue Post Debridement is: Fat layer exposed. Post procedure Diagnosis Wound #2: Same as Pre-Procedure Plan Wound Cleansing: Wound #1 Right,Anterior Lower Leg: Clean wound with Normal Saline. Wound #2 Right,Posterior Lower Leg: Clean wound with Normal Saline. Wound #3 Right,Lateral Calcaneus: Clean wound with Normal Saline. Anesthetic (add to Medication List): Wound #1 Right,Anterior Lower Leg: Topical Lidocaine 4% cream applied to wound bed prior to debridement (In Clinic Only). Wound #2 Right,Posterior Lower Leg: Topical Lidocaine 4% cream applied to wound bed prior to debridement (In Clinic Only). Wound #3 Right,Lateral Calcaneus: Topical Lidocaine 4% cream applied to wound bed prior to debridement (In Clinic Only). Primary  Wound Dressing: Wound #2 Right,Posterior Lower Leg: Silver Alginate Wound #3 Right,Lateral Calcaneus: Silver Alginate Wound #1 Right,Anterior Lower Leg: Santyl Ointment - black areas only Secondary Dressing: Wound #1 Right,Anterior Lower Leg: Other - Keramax or equal Wound #2 Right,Posterior Lower Leg: Other - Keramax or equal Wound #3 Right,Lateral Calcaneus: Other - Keramax or equal Dressing Change Frequency: Wound #1 Right,Anterior Lower Leg: Change Dressing Monday, Wednesday, Friday Wound #2 Right,Posterior Lower Leg: Change Dressing Monday, Wednesday, Friday JULIAN, ASKIN. ([id number]) Wound #3 Right,Lateral Calcaneus: Change Dressing Monday, Wednesday, Friday Follow-up Appointments: Wound #1 Right,Anterior Lower Leg: Return Appointment in 2 weeks. - Patient has endoscopy next week. Wound #2 Right,Posterior Lower Leg: Return Appointment in 2 weeks. - Patient has endoscopy next week. Wound #3 Right,Lateral Calcaneus: Return Appointment in 2 weeks. - Patient has endoscopy next week. Edema Control: Wound #1 Right,Anterior Lower Leg: Kerlix and Coban - Right Lower Extremity Elevate legs to the level of the heart and pump ankles as often as possible  Wound #2 Right,Posterior Lower Leg: Kerlix and Coban - Right Lower Extremity Elevate legs to the level of the heart and pump ankles as often as possible Wound #3 Right,Lateral Calcaneus: Kerlix and Coban - Right Lower Extremity Elevate legs to the level of the heart and pump ankles as often as possible Home Health: Wound #1 Right,Anterior Lower Leg: [place] Visits - [place] Nurse may visit PRN to address patient s wound care needs. FACE TO FACE ENCOUNTER: MEDICARE and MEDICAID PATIENTS: I certify that this patient is under my care and that I had a face-to-face encounter that meets the physician face-to-face encounter requirements with this patient on this date. The encounter with the patient was  in whole or in part for the following MEDICAL CONDITION: (primary reason for [place]) MEDICAL NECESSITY: I certify, that based on my findings, NURSING services are a medically necessary home health service. HOME BOUND STATUS: I certify that my clinical findings support that this patient is homebound (i.e., Due to illness or injury, pt requires aid of supportive devices such as crutches, cane, wheelchairs, walkers, the use of special transportation or the assistance of another person to leave their place of residence. There is a normal inability to leave the home and doing so requires considerable and taxing effort. Other absences are for medical reasons / religious services and are infrequent or of short duration when for other reasons). If current dressing causes regression in wound condition, may D/C ordered dressing product/s and apply Normal Saline Moist Dressing daily until next [place] / Other MD appointment. [place] of regression in wound condition at [phone]. Wound #2 Right,Posterior Lower Leg: [place] Visits - [place] Nurse may visit PRN to address patient s wound care needs. FACE TO FACE ENCOUNTER: MEDICARE and MEDICAID PATIENTS: I certify that this patient is under my care and that I had a face-to-face encounter that meets the physician face-to-face encounter requirements with this patient on this date. The encounter with the patient was in whole or in part for the following MEDICAL CONDITION: (primary reason for [place]) MEDICAL NECESSITY: I certify, that based on my findings, NURSING services are a medically necessary home health service. HOME BOUND STATUS: I certify that my clinical findings support that this patient is homebound (i.e., Due to illness or injury, pt requires aid of supportive devices such as crutches, cane, wheelchairs, walkers, the use of special transportation or the assistance of  another person to leave their place of residence. There is a normal inability to leave the home and doing so requires considerable and taxing effort. Other absences are for medical reasons / religious services and are infrequent or of short duration when for other reasons). If current dressing causes regression in wound condition, may D/C ordered dressing product/s and apply Normal Saline Moist Dressing daily until next [place] / Other MD appointment. [place] of regression in wound condition at [phone]. Wound #3 Right,Lateral Calcaneus: Continue Home Health Visits - [place] Nurse may visit PRN to address patient s wound care needs. FACE TO FACE ENCOUNTER: MEDICARE and MEDICAID PATIENTS: I certify that this patient is under my care and that I had a face-to-face encounter that meets the physician face-to-face encounter requirements with this patient on this date. The encounter with the patient was in whole or in part for the following MEDICAL CONDITION: (primary reason for [place]) MEDICAL NECESSITY: I certify, that based on my  findings, NURSING services are a medically necessary home health service. HOME BOUND STATUS: I certify that my clinical findings support that this patient is homebound (i.e., Due to illness or injury, pt requires aid of supportive devices such as crutches, cane, wheelchairs, walkers, the use of special transportation or the assistance of another person to leave their place of residence. There is a normal inability to leave the MONEKA, MCQUINN. ([id number]) home and doing so requires considerable and taxing effort. Other absences are for medical reasons / religious services and are infrequent or of short duration when for other reasons). If current dressing causes regression in wound condition, may D/C ordered dressing product/s and apply Normal Saline Moist Dressing daily until next [place] / Other  MD appointment. [place] of regression in wound condition at [phone]. Medications-please add to medication list.: Wound #1 Right,Anterior Lower Leg: Santyl Enzymatic Ointment #1 we are going to use Santyl to the tightly adherent necrotic eschar anteriorly #2 silver alginate to the posterior left leg wounds. #3 she has home health changing these Monday Wednesday and Friday in [place]. #4 she has an appointment with [facility] dermatology on July 10th. I'm hopeful with their help to firm up the diagnosis here either calciphylaxis or something else. #5 the patient is already been offered an amputation and refused. Electronic Signature(s) Signed: 03/17/2018 4:07:49 PM By: Linton Ham MD Entered By: Linton Ham on 03/17/2018 15:19:20 [name] ([id number]) -------------------------------------------------------------------------------- [place] Details Patient Name: [name]. Date of Service: 03/17/2018 Medical Record Number: [id number] Patient Account Number: [id number] Date of Birth/Sex: 11-06-1945 (72 y.o. 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 Little I13.2 disease, or end stage renal disease Facility Procedures CPT4 Code: 43154008 Description: 67619 - DEB SUBQ TISSUE 20 SQ CM/< ICD-10 Diagnosis Description L97.213 Non-pressure chronic ulcer of right calf with necrosis of m 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 Modifier:  uscle Quantity: 1 CPT4 Code: 50932671 Description: 24580 - DEB SUBQ TISS EA ADDL 20CM ICD-10 Diagnosis Description L97.213 Non-pressure chronic ulcer of right calf with necrosis of m Modifier: uscle Quantity: 8 Physician Procedures CPT4 Code: 9983382 Description: 11042 - WC PHYS SUBQ TISS 20 SQ CM ICD-10 Diagnosis Description L97.213 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

## 2018-03-19 NOTE — Progress Notes (Signed)
WALBURGA, HUDMAN ([id number]) Visit Report for 03/17/2018 Arrival Information Details Patient Name: [name], [name]. Date of Service: 03/17/2018 2:15 PM Medical Record Number: [id number] Patient Account Number: [id number] Date of Birth/Sex: 06/18/1946 (72 y.o. F) Treating RN: Roger Shelter Primary Care Donielle Kaigler: Sharilyn Sites Other Clinician: Referring Genavive Kubicki: Sharilyn Sites Treating Abriana Saltos/Extender: Tito Dine in Treatment: 1 Visit Information History Since Last Visit All ordered tests and consults were completed: No Patient Arrived: Wheel Chair Added or deleted any medications: No Arrival Time: 14:23 Any new allergies or adverse reactions: No Accompanied By: spouse Had a fall or experienced change in No Transfer Assistance: EasyPivot Patient activities of daily living that may affect Lift risk of falls: Patient Identification Verified: Yes Signs or symptoms of abuse/neglect since last visito No Secondary Verification Process Yes Hospitalized since last visit: No Completed: Implantable device outside of the clinic excluding No Patient Requires Transmission-Based No cellular tissue based products placed in the center Precautions: since last visit: Patient Has Alerts: Yes Pain Present Now: Yes Patient Alerts: Patient on Blood Thinner Eliquis DM II Electronic Signature(s) Signed: 03/17/2018 4:10:19 PM By: Roger Shelter Entered By: Roger Shelter on 03/17/2018 14:23:57 [name] ([id number]) -------------------------------------------------------------------------------- Encounter Discharge Information Details Patient Name: [name]. Date of Service: 03/17/2018 2:15 PM Medical Record Number: [id number] Patient Account Number: [id number] Date of Birth/Sex: 03-07-1946 (72 y.o. F) Treating RN: Montey Hora Primary Care Mallery Harshman: Sharilyn Sites Other Clinician: Referring Drayk Humbarger: Sharilyn Sites Treating Shericka Johnstone/Extender:  Tito Dine in Treatment: 1 Encounter Discharge Information Items Discharge Condition: Stable Ambulatory Status: Wheelchair Discharge Destination: Home Transportation: Private Auto Accompanied By: spouse Schedule Follow-up Appointment: Yes Clinical Summary of Care: Electronic Signature(s) Signed: 03/17/2018 3:43:21 PM By: Montey Hora Entered By: Montey Hora on 03/17/2018 15:43:20 [name] ([id number]) -------------------------------------------------------------------------------- Lower Extremity Assessment Details Patient Name: [name]. Date of Service: 03/17/2018 2:15 PM Medical Record Number: [id number] Patient Account Number: [id number] Date of Birth/Sex: [birth year] (72 y.o. F) Treating RN: Roger Shelter Primary Care Yailene Badia: Sharilyn Sites Other Clinician: Referring Zai Chmiel: Sharilyn Sites Treating Estoria Geary/Extender: Tito Dine in Treatment: 1 Edema Assessment Assessed: [Left: No] [Right: No] [Left: Edema] [Right: :] Calf Left: Right: Point of Measurement: 36 cm From Medial Instep cm 30.5 cm Ankle Left: Right: Point of Measurement: 12 cm From Medial Instep cm 20.7 cm Vascular Assessment Claudication: Claudication Assessment [Right:None] Pulses: Dorsalis Pedis Palpable: [Right:Yes] Posterior Tibial Extremity colors, hair growth, and conditions: Extremity Color: [Right:Hyperpigmented] Hair Growth on Extremity: [Right:No] Temperature of Extremity: [Right:Warm] Capillary Refill: [Right:> 3 seconds] Toe Nail Assessment Left: Right: Thick: Yes Discolored: Yes Deformed: Yes Improper Length and Hygiene: Yes Electronic Signature(s) Signed: 03/17/2018 4:10:19 PM By: Roger Shelter Entered By: Roger Shelter on 03/17/2018 14:38:32 [name] ([id number]) -------------------------------------------------------------------------------- Multi Wound Chart Details Patient Name: [name]. Date of Service: 03/17/2018 2:15 PM Medical Record Number: [id number] Patient Account Number: [id number] Date of Birth/Sex: 1946/09/24 (72 y.o. F) Treating RN: Cornell Barman Primary Care Malaya Cagley: Sharilyn Sites Other Clinician: Referring Benard Minturn: Sharilyn Sites Treating Aliyana Dlugosz/Extender: Tito Dine in Treatment: 1 Vital Signs Height(in): 65 Pulse(bpm): 70 Weight(lbs): 164 Blood Pressure(mmHg): 144/87 Body Mass Index(BMI): 27 Temperature(F): 98.1 Respiratory Rate 18 (breaths/min): Photos: [1:No Photos] [2:No Photos] [3:No Photos] Wound Location: [1:Right, Anterior Lower Leg] [2:Right, Posterior Lower Leg] [3:Right, Lateral Calcaneus] Wounding Event: [1:Gradually Appeared] [2:Gradually Appeared] [3:Gradually Appeared] Primary Etiology: [1:Calciphylaxis] [2:Calciphylaxis] [3:Calciphylaxis] Secondary Etiology: [1:Diabetic Wound/Ulcer of the Lower Extremity] [2:Diabetic Wound/Ulcer of the Lower Extremity] [3:Diabetic Wound/Ulcer  of the Lower Extremity] Date Acquired: [1:06/29/2017] [2:06/29/2017] [3:06/29/2017] Weeks of Treatment: [1:1] [2:1] [3:1] Wound Status: [1:Open] [2:Open] [3:Open] Measurements L x W x D [1:10.5x6.5x0.2] [2:12.4x10.5x0.3] [3:1x1x0.1] (cm) Area (cm) : [1:53.603] [2:102.259] [3:0.785] Volume (cm) : [1:10.721] [2:30.678] [3:0.079] % Reduction in Area: [1:-54.60%] [2:10.90%] [3:9.10%] % Reduction in Volume: [1:-54.50%] [2:10.90%] [3:8.10%] Classification: [1:Full Thickness Without Exposed Support Structures] [2:Full Thickness Without Exposed Support Structures] [3:Full Thickness Without Exposed Support Structures] Debridement: [1:Debridement - Excisional] [2:Debridement - Excisional] [3:N/A] Pre-procedure [1:14:56] [2:14:54] [3:N/A] Verification/Time Out Taken: Pain Control: [1:Lidocaine 4% Topical Solution] [2:Lidocaine 4% Topical Solution] [3:N/A] Tissue Debrided: [1:Necrotic/Eschar, Subcutaneous, Slough] [2:Necrotic/Eschar, Subcutaneous, Slough]  [3:N/A] Level: [1:Skin/Subcutaneous Tissue] [2:Skin/Subcutaneous Tissue] [3:N/A] Debridement Area (sq cm): 32.5 [2:130.2] [3:N/A] Instrument: [1:Blade, Forceps] [2:Blade, Forceps] [3:N/A] Bleeding: [1:Minimum] [2:Minimum] [3:N/A] Hemostasis Achieved: [1:Pressure] [2:Pressure] [3:N/A] Procedural Pain: [1:0] [2:0] [3:N/A] Post Procedural Pain: [1:0] [2:0] [3:N/A] Debridement Treatment [1:Procedure was tolerated well] [2:Procedure was tolerated well] [3:N/A] Response: Post Debridement [1:10.5x6.5x0.3] [2:12.4x10.5x0.4] [3:N/A] Measurements L x W x D (cm) Post Debridement Volume: 16.081 [2:40.904] [3:N/A] (cm) HANG, AMMON. ([id number]) Periwound Skin Texture: No Abnormalities Noted No Abnormalities Noted No Abnormalities Noted Periwound Skin Moisture: No Abnormalities Noted No Abnormalities Noted No Abnormalities Noted Periwound Skin Color: No Abnormalities Noted No Abnormalities Noted No Abnormalities Noted Tenderness on Palpation: No No No Procedures Performed: Debridement Debridement N/A Treatment Notes Electronic Signature(s) Signed: 03/17/2018 4:07:49 PM By: Linton Ham MD Entered By: Linton Ham on 03/17/2018 15:12:25 [name] ([id number]) -------------------------------------------------------------------------------- [place] Plan Details Patient Name: [name]. Date of Service: 03/17/2018 2:15 PM Medical Record Number: [id number] Patient Account Number: [id number] Date of Birth/Sex: [birth year] (72 y.o. F) Treating RN: Cornell Barman Primary Care Kenan Moodie: Sharilyn Sites Other Clinician: Referring Taiga Lupinacci: Sharilyn Sites Treating Railey Glad/Extender: Tito Dine in Treatment: 1 Active Inactive ` Necrotic Tissue Nursing Diagnoses: Impaired tissue integrity related to necrotic/devitalized tissue Goals: Necrotic/devitalized tissue will be minimized in the wound bed Date Initiated: 03/10/2018 Target Resolution Date:  05/10/2018 Goal Status: Active Patient/caregiver will verbalize understanding of reason and process for debridement of necrotic tissue Date Initiated: 03/10/2018 Target Resolution Date: 03/25/2018 Goal Status: Active Interventions: Assess patient pain level pre-, during and post procedure and prior to discharge Treatment Activities: Apply topical anesthetic as ordered : 03/10/2018 Notes: ` Orientation to the Wound Care Program Nursing Diagnoses: Knowledge deficit related to the wound healing center program Goals: Patient/caregiver will verbalize understanding of the [place] Program Date Initiated: 03/10/2018 Target Resolution Date: 04/02/2018 Goal Status: Active Interventions: Provide education on orientation to the wound center Notes: ` Soft Tissue Infection Nursing Diagnoses: Impaired tissue integrity Potential for infection: soft tissue INIYA, MATZEK ([id number]) Goals: Patient will remain free of wound infection Date Initiated: 03/10/2018 Target Resolution Date: 04/02/2018 Goal Status: Active Interventions: Assess signs and symptoms of infection every visit Notes: ` Wound/Skin Impairment Nursing Diagnoses: Impaired tissue integrity Goals: Ulcer/skin breakdown will have a volume reduction of 80% by week 12 Date Initiated: 03/10/2018 Target Resolution Date: 06/10/2018 Goal Status: Active Interventions: Assess ulceration(s) every visit Treatment Activities: Skin care regimen initiated : 03/10/2018 Topical wound management initiated : 03/10/2018 Notes: Electronic Signature(s) Signed: 03/17/2018 4:19:21 PM By: Gretta Cool, BSN, RN, CWS, Kim RN, BSN Entered By: Gretta Cool, BSN, RN, CWS, Kim on 03/17/2018 14:55:24 [name] ([id number]) -------------------------------------------------------------------------------- Pain Assessment Details Patient Name: [name]. Date of Service: 03/17/2018 2:15 PM Medical Record Number: [id number] Patient  Account Number: [id number] Date of Birth/Sex: [birth year] (72 y.o. F) Treating RN: Flinchum,  Malachy Mood Primary Care Kimyata Milich: Sharilyn Sites Other Clinician: Referring Eleen Litz: Sharilyn Sites Treating Doryan Bahl/Extender: Tito Dine in Treatment: 1 Active Problems Location of Pain Severity and Description of Pain Patient Has Paino Yes Site Locations Pain Location: Generalized Pain Duration of the Pain. Constant / Intermittento Constant Rate the pain. Current Pain Level: 8 Pain Management and Medication Current Pain Management: Electronic Signature(s) Signed: 03/17/2018 4:10:19 PM By: Roger Shelter Entered By: Roger Shelter on 03/17/2018 14:24:13 [name] ([id number]) -------------------------------------------------------------------------------- Patient/Caregiver Education Details Patient Name: [name]. Date of Service: 03/17/2018 2:15 PM Medical Record Number: [id number] Patient Account Number: [id number] Date of Birth/Gender: 1946-04-10 (72 y.o. F) Treating RN: Montey Hora Primary Care Physician: Sharilyn Sites Other Clinician: Referring Physician: Sharilyn Sites Treating Physician/Extender: Tito Dine in Treatment: 1 Education Assessment Education Provided To: Patient and Caregiver Education Topics Provided Venous: Handouts: Other: leg elevation Methods: Explain/Verbal Responses: State content correctly Electronic Signature(s) Signed: 03/17/2018 3:47:23 PM By: Montey Hora Entered By: Montey Hora on 03/17/2018 15:43:40 [name] ([id number]) -------------------------------------------------------------------------------- Wound Assessment Details Patient Name: [name]. Date of Service: 03/17/2018 2:15 PM Medical Record Number: [id number] Patient Account Number: [id number] Date of Birth/Sex: 16-Oct-1945 (72 y.o. F) Treating RN: Roger Shelter Primary Care Janeisha Ryle: Sharilyn Sites Other  Clinician: Referring Nayeliz Hipp: Sharilyn Sites Treating Larah Kuntzman/Extender: Tito Dine in Treatment: 1 Wound Status Wound Number: 1 Primary Etiology: Calciphylaxis Wound Location: Right, Anterior Lower Leg Secondary Diabetic Wound/Ulcer of the Lower Etiology: Extremity Wounding Event: Gradually Appeared Wound Status: Open Date Acquired: 06/29/2017 Weeks Of Treatment: 1 Clustered Wound: No Photos Photo Uploaded By: Roger Shelter on 03/17/2018 16:04:19 Wound Measurements Length: (cm) 10.5 Width: (cm) 6.5 Depth: (cm) 0.2 Area: (cm) 53.603 Volume: (cm) 10.721 % Reduction in Area: -54.6% % Reduction in Volume: -54.5% Wound Description Full Thickness Without Exposed Support Classification: Structures Periwound Skin Texture Texture Color No Abnormalities Noted: No No Abnormalities Noted: No Moisture No Abnormalities Noted: No Treatment Notes Wound #1 (Right, Anterior Lower Leg) 1. Cleansed with: Cleanse wound with antibacterial soap and water 2. Anesthetic Topical Lidocaine 4% cream to wound bed prior to debridement KHAMILA, BASSINGER F. ([id number]) 4. Dressing Applied: Santyl Ointment Other dressing (specify in notes) 5. Secondary Dressing Applied ABD Pad 7. Secured with Tape Other (specify in notes) Notes kerlix/coban wrap Electronic Signature(s) Signed: 03/17/2018 4:10:19 PM By: Roger Shelter Entered By: Roger Shelter on 03/17/2018 14:35:09 [name] ([id number]) -------------------------------------------------------------------------------- Wound Assessment Details Patient Name: [name]. Date of Service: 03/17/2018 2:15 PM Medical Record Number: [id number] Patient Account Number: [id number] Date of Birth/Sex: [birth year] (72 y.o. F) Treating RN: Roger Shelter Primary Care Monte Zinni: Sharilyn Sites Other Clinician: Referring Cardin Nitschke: Sharilyn Sites Treating Tammi Boulier/Extender: Tito Dine in Treatment:  1 Wound Status Wound Number: 2 Primary Etiology: Calciphylaxis Wound Location: Right, Posterior Lower Leg Secondary Diabetic Wound/Ulcer of the Lower Etiology: Extremity Wounding Event: Gradually Appeared Wound Status: Open Date Acquired: 06/29/2017 Weeks Of Treatment: 1 Clustered Wound: No Photos Photo Uploaded By: Roger Shelter on 03/17/2018 16:04:20 Wound Measurements Length: (cm) 12.4 Width: (cm) 10.5 Depth: (cm) 0.3 Area: (cm) 102.259 Volume: (cm) 30.678 % Reduction in Area: 10.9% % Reduction in Volume: 10.9% Wound Description Full Thickness Without Exposed Support Classification: Structures Periwound Skin Texture Texture Color No Abnormalities Noted: No No Abnormalities Noted: No Moisture No Abnormalities Noted: No Treatment Notes Wound #2 (Right, Posterior Lower Leg) 1. Cleansed with: Cleanse wound with antibacterial soap and water 2. Anesthetic Topical Lidocaine 4% cream to wound  bed prior to debridement NABIHA, PLANCK F. ([id number]) 4. Dressing Applied: Calcium Alginate with Silver Other dressing (specify in notes) 5. Secondary Dressing Applied ABD Pad 7. Secured with Tape Other (specify in notes) Notes kerlix/coban wrap Electronic Signature(s) Signed: 03/17/2018 4:10:19 PM By: Roger Shelter Entered By: Roger Shelter on 03/17/2018 14:36:53 [name] ([id number]) -------------------------------------------------------------------------------- Wound Assessment Details Patient Name: [name]. Date of Service: 03/17/2018 2:15 PM Medical Record Number: [id number] Patient Account Number: [id number] Date of Birth/Sex: 1945-11-03 (72 y.o. F) Treating RN: Roger Shelter Primary Care Shlomo Seres: Sharilyn Sites Other Clinician: Referring Chriselda Leppert: Sharilyn Sites Treating Makenzi Bannister/Extender: Tito Dine in Treatment: 1 Wound Status Wound Number: 3 Primary Calciphylaxis Etiology: Wound Location: Right Calcaneus  - Lateral Secondary Diabetic Wound/Ulcer of the Lower Extremity Wounding Event: Gradually Appeared Etiology: Date Acquired: 06/29/2017 Wound Open Weeks Of Treatment: 1 Status: Clustered Wound: No Comorbid Cataracts, Anemia, Chronic Obstructive History: Pulmonary Disease (COPD), Arrhythmia, Hypertension, Type II Diabetes, End Stage Renal Disease, Gout, Rheumatoid Arthritis Photos Wound Measurements Length: (cm) 1 Width: (cm) 1 Depth: (cm) 0.1 Area: (cm) 0.785 Volume: (cm) 0.079 % Reduction in Area: 9.1% % Reduction in Volume: 8.1% Epithelialization: None Wound Description Full Thickness Without Exposed Support Classification: Structures Wound Margin: Flat and Intact Exudate None Present Amount: Foul Odor After Cleansing: No Slough/Fibrino Yes Wound Bed Granulation Amount: None Present (0%) Necrotic Amount: Large (67-100%) Necrotic Quality: Eschar Periwound Skin Texture Texture Color No Abnormalities Noted: No No Abnormalities Noted: No Keyoni, Lapinski Samanvitha F. ([id number]) Moisture Temperature / Pain No Abnormalities Noted: No Temperature: No Abnormality Tenderness on Palpation: Yes Wound Preparation Ulcer Cleansing: Rinsed/Irrigated with Saline, Other: soap and water, Topical Anesthetic Applied: Other: lidocaine 4%, Treatment Notes Wound #3 (Right, Lateral Calcaneus) 1. Cleansed with: Cleanse wound with antibacterial soap and water 2. Anesthetic Topical Lidocaine 4% cream to wound bed prior to debridement 4. Dressing Applied: Calcium Alginate with Silver Other dressing (specify in notes) 5. Secondary Dressing Applied ABD Pad 7. Secured with Tape Other (specify in notes) Notes kerlix/coban wrap Electronic Signature(s) Signed: 03/17/2018 4:10:19 PM By: Roger Shelter Entered By: Roger Shelter on 03/17/2018 16:08:00 [name] ([id number]) -------------------------------------------------------------------------------- Vitals  Details Patient Name: [name]. Date of Service: 03/17/2018 2:15 PM Medical Record Number: [id number] Patient Account Number: [id number] Date of Birth/Sex: 1945-12-16 (72 y.o. F) Treating RN: Roger Shelter Primary Care Servando Kyllonen: Sharilyn Sites Other Clinician: Referring Ashaunte Standley: Sharilyn Sites Treating Shann Merrick/Extender: Tito Dine in Treatment: 1 Vital Signs Time Taken: 14:24 Temperature (F): 98.1 Height (in): 65 Pulse (bpm): 70 Weight (lbs): 164 Respiratory Rate (breaths/min): 18 Body Mass Index (BMI): 27.3 Blood Pressure (mmHg): 144/87 Reference Range: 80 - 120 mg / dl Electronic Signature(s) Signed: 03/17/2018 4:10:19 PM By: Roger Shelter Entered By: Roger Shelter on 03/17/2018 14:24:44

## 2018-03-20 DIAGNOSIS — N186 End stage renal disease: Secondary | ICD-10-CM | POA: Diagnosis not present

## 2018-03-20 DIAGNOSIS — E876 Hypokalemia: Secondary | ICD-10-CM | POA: Diagnosis not present

## 2018-03-20 DIAGNOSIS — D631 Anemia in chronic kidney disease: Secondary | ICD-10-CM | POA: Diagnosis not present

## 2018-03-20 DIAGNOSIS — D689 Coagulation defect, unspecified: Secondary | ICD-10-CM | POA: Diagnosis not present

## 2018-03-20 DIAGNOSIS — N2581 Secondary hyperparathyroidism of renal origin: Secondary | ICD-10-CM | POA: Diagnosis not present

## 2018-03-20 DIAGNOSIS — E1129 Type 2 diabetes mellitus with other diabetic kidney complication: Secondary | ICD-10-CM | POA: Diagnosis not present

## 2018-03-20 DIAGNOSIS — D509 Iron deficiency anemia, unspecified: Secondary | ICD-10-CM | POA: Diagnosis not present

## 2018-03-22 DIAGNOSIS — L89892 Pressure ulcer of other site, stage 2: Secondary | ICD-10-CM | POA: Diagnosis not present

## 2018-03-22 DIAGNOSIS — E1151 Type 2 diabetes mellitus with diabetic peripheral angiopathy without gangrene: Secondary | ICD-10-CM | POA: Diagnosis not present

## 2018-03-22 DIAGNOSIS — E1140 Type 2 diabetes mellitus with diabetic neuropathy, unspecified: Secondary | ICD-10-CM | POA: Diagnosis not present

## 2018-03-23 ENCOUNTER — Telehealth: Payer: Self-pay

## 2018-03-23 DIAGNOSIS — D509 Iron deficiency anemia, unspecified: Secondary | ICD-10-CM | POA: Diagnosis not present

## 2018-03-23 DIAGNOSIS — N2581 Secondary hyperparathyroidism of renal origin: Secondary | ICD-10-CM | POA: Diagnosis not present

## 2018-03-23 DIAGNOSIS — E1129 Type 2 diabetes mellitus with other diabetic kidney complication: Secondary | ICD-10-CM | POA: Diagnosis not present

## 2018-03-23 DIAGNOSIS — E876 Hypokalemia: Secondary | ICD-10-CM | POA: Diagnosis not present

## 2018-03-23 DIAGNOSIS — D689 Coagulation defect, unspecified: Secondary | ICD-10-CM | POA: Diagnosis not present

## 2018-03-23 DIAGNOSIS — N186 End stage renal disease: Secondary | ICD-10-CM | POA: Diagnosis not present

## 2018-03-23 DIAGNOSIS — D631 Anemia in chronic kidney disease: Secondary | ICD-10-CM | POA: Diagnosis not present

## 2018-03-23 NOTE — Telephone Encounter (Signed)
Called and spoke to pt's husband, she is now at home (no longer at [facility]). EGD for tomorrow moved up to 9:30am, arrive at 8:30am. Advised her husband for her to be NPO after midnight. LMOVM for endo scheduler.

## 2018-03-24 ENCOUNTER — Encounter (HOSPITAL_COMMUNITY): Payer: Self-pay | Admitting: *Deleted

## 2018-03-24 ENCOUNTER — Encounter (HOSPITAL_COMMUNITY)
Admission: RE | Disposition: A | Discharge status: Home / Self Care | Payer: Self-pay | Source: Ambulatory Visit | Attending: Internal Medicine

## 2018-03-24 ENCOUNTER — Ambulatory Visit (HOSPITAL_COMMUNITY)
Admission: RE | Admit: 2018-03-24 | Discharge: 2018-03-24 | Disposition: A | Discharge status: Home / Self Care | Payer: Medicare Other | Source: Ambulatory Visit | Attending: Internal Medicine | Admitting: Internal Medicine

## 2018-03-24 ENCOUNTER — Other Ambulatory Visit: Payer: Self-pay

## 2018-03-24 DIAGNOSIS — Z86718 Personal history of other venous thrombosis and embolism: Secondary | ICD-10-CM | POA: Insufficient documentation

## 2018-03-24 DIAGNOSIS — K210 Gastro-esophageal reflux disease with esophagitis: Secondary | ICD-10-CM | POA: Diagnosis not present

## 2018-03-24 DIAGNOSIS — I4891 Unspecified atrial fibrillation: Secondary | ICD-10-CM | POA: Diagnosis not present

## 2018-03-24 DIAGNOSIS — E1122 Type 2 diabetes mellitus with diabetic chronic kidney disease: Secondary | ICD-10-CM | POA: Diagnosis not present

## 2018-03-24 DIAGNOSIS — K3189 Other diseases of stomach and duodenum: Secondary | ICD-10-CM | POA: Diagnosis not present

## 2018-03-24 DIAGNOSIS — J449 Chronic obstructive pulmonary disease, unspecified: Secondary | ICD-10-CM | POA: Insufficient documentation

## 2018-03-24 DIAGNOSIS — M329 Systemic lupus erythematosus, unspecified: Secondary | ICD-10-CM | POA: Insufficient documentation

## 2018-03-24 DIAGNOSIS — Z79899 Other long term (current) drug therapy: Secondary | ICD-10-CM | POA: Insufficient documentation

## 2018-03-24 DIAGNOSIS — K209 Esophagitis, unspecified: Secondary | ICD-10-CM | POA: Diagnosis not present

## 2018-03-24 DIAGNOSIS — I5032 Chronic diastolic (congestive) heart failure: Secondary | ICD-10-CM | POA: Diagnosis not present

## 2018-03-24 DIAGNOSIS — N184 Chronic kidney disease, stage 4 (severe): Secondary | ICD-10-CM | POA: Diagnosis not present

## 2018-03-24 DIAGNOSIS — K2210 Ulcer of esophagus without bleeding: Secondary | ICD-10-CM | POA: Insufficient documentation

## 2018-03-24 DIAGNOSIS — R195 Other fecal abnormalities: Secondary | ICD-10-CM | POA: Diagnosis not present

## 2018-03-24 DIAGNOSIS — Z87891 Personal history of nicotine dependence: Secondary | ICD-10-CM | POA: Insufficient documentation

## 2018-03-24 DIAGNOSIS — K2951 Unspecified chronic gastritis with bleeding: Secondary | ICD-10-CM | POA: Diagnosis not present

## 2018-03-24 DIAGNOSIS — Z7952 Long term (current) use of systemic steroids: Secondary | ICD-10-CM | POA: Diagnosis not present

## 2018-03-24 DIAGNOSIS — Z7901 Long term (current) use of anticoagulants: Secondary | ICD-10-CM | POA: Insufficient documentation

## 2018-03-24 DIAGNOSIS — D509 Iron deficiency anemia, unspecified: Secondary | ICD-10-CM

## 2018-03-24 DIAGNOSIS — R1031 Right lower quadrant pain: Secondary | ICD-10-CM | POA: Diagnosis not present

## 2018-03-24 DIAGNOSIS — I130 Hypertensive heart and chronic kidney disease with heart failure and stage 1 through stage 4 chronic kidney disease, or unspecified chronic kidney disease: Secondary | ICD-10-CM | POA: Diagnosis not present

## 2018-03-24 DIAGNOSIS — M109 Gout, unspecified: Secondary | ICD-10-CM | POA: Diagnosis not present

## 2018-03-24 DIAGNOSIS — G8929 Other chronic pain: Secondary | ICD-10-CM

## 2018-03-24 DIAGNOSIS — H409 Unspecified glaucoma: Secondary | ICD-10-CM | POA: Insufficient documentation

## 2018-03-24 DIAGNOSIS — K2950 Unspecified chronic gastritis without bleeding: Secondary | ICD-10-CM | POA: Insufficient documentation

## 2018-03-24 DIAGNOSIS — R1010 Upper abdominal pain, unspecified: Secondary | ICD-10-CM | POA: Diagnosis present

## 2018-03-24 DIAGNOSIS — Z794 Long term (current) use of insulin: Secondary | ICD-10-CM | POA: Diagnosis not present

## 2018-03-24 DIAGNOSIS — M150 Primary generalized (osteo)arthritis: Secondary | ICD-10-CM | POA: Diagnosis not present

## 2018-03-24 DIAGNOSIS — N186 End stage renal disease: Secondary | ICD-10-CM | POA: Diagnosis not present

## 2018-03-24 DIAGNOSIS — Z992 Dependence on renal dialysis: Secondary | ICD-10-CM | POA: Diagnosis not present

## 2018-03-24 DIAGNOSIS — E785 Hyperlipidemia, unspecified: Secondary | ICD-10-CM | POA: Diagnosis not present

## 2018-03-24 DIAGNOSIS — I872 Venous insufficiency (chronic) (peripheral): Secondary | ICD-10-CM | POA: Diagnosis not present

## 2018-03-24 HISTORY — PX: ESOPHAGOGASTRODUODENOSCOPY: SHX5428

## 2018-03-24 LAB — GLUCOSE, CAPILLARY: Glucose-Capillary: 108 mg/dL — ABNORMAL HIGH (ref 70–99)

## 2018-03-24 SURGERY — EGD (ESOPHAGOGASTRODUODENOSCOPY)
Anesthesia: Moderate Sedation

## 2018-03-24 MED ORDER — SODIUM CHLORIDE 0.9 % IV SOLN
INTRAVENOUS | Status: DC
  Administered 2018-03-24: 1000 mL via INTRAVENOUS

## 2018-03-24 MED ORDER — MEPERIDINE HCL 100 MG/ML IJ SOLN
INTRAMUSCULAR | Status: DC | PRN
  Administered 2018-03-24: 15 mg via INTRAVENOUS

## 2018-03-24 MED ORDER — MEPERIDINE HCL 100 MG/ML IJ SOLN
INTRAMUSCULAR | Status: AC
  Filled 2018-03-24: qty 2

## 2018-03-24 MED ORDER — ONDANSETRON HCL 4 MG/2ML IJ SOLN
INTRAMUSCULAR | Status: DC
Start: 2018-03-24 — End: 2018-03-24
  Filled 2018-03-24: qty 2

## 2018-03-24 MED ORDER — STERILE WATER FOR IRRIGATION IR SOLN
Status: DC | PRN
  Administered 2018-03-24: 2.5 mL

## 2018-03-24 MED ORDER — LIDOCAINE VISCOUS HCL 2 % MT SOLN
OROMUCOSAL | Status: AC
  Filled 2018-03-24: qty 15

## 2018-03-24 MED ORDER — ONDANSETRON HCL 4 MG/2ML IJ SOLN
INTRAMUSCULAR | Status: DC | PRN
  Administered 2018-03-24: 4 mg via INTRAVENOUS

## 2018-03-24 MED ORDER — MIDAZOLAM HCL 5 MG/5ML IJ SOLN
INTRAMUSCULAR | Status: AC
  Filled 2018-03-24: qty 10

## 2018-03-24 MED ORDER — LIDOCAINE VISCOUS HCL 2 % MT SOLN
OROMUCOSAL | Status: DC | PRN
  Administered 2018-03-24: 1 via OROMUCOSAL

## 2018-03-24 MED ORDER — MIDAZOLAM HCL 5 MG/5ML IJ SOLN
INTRAMUSCULAR | Status: DC | PRN
  Administered 2018-03-24 (×2): 1 mg via INTRAVENOUS

## 2018-03-24 NOTE — Op Note (Signed)
[facility] Patient Name: [name] Procedure Date: 03/24/2018 9:19 AM MRN: [id number] Date of Birth: 07-25-1946 Attending MD: Norvel Richards , MD CSN: [id number] Age: 72 Admit Type: Outpatient Procedure:                Upper GI endoscopy Indications:              Abdominal pain in the right lower quadrant, Heme                            positive stool Providers:                Norvel Richards, MD, Lurline Del, RN, Randa Spike, Technician Referring MD:              Medicines:                Midazolam 2 mg IV, Meperidine 15 mg IV, Ondansetron                            4 mg IV Complications:            No immediate complications. Estimated Blood Loss:     Estimated blood loss was minimal. Procedure:                Pre-Anesthesia Assessment:                           - Prior to the procedure, a History and Physical                            was performed, and patient medications and                            allergies were reviewed. The patient's tolerance of                            previous anesthesia was also reviewed. The risks                            and benefits of the procedure and the sedation                            options and risks were discussed with the patient.                            All questions were answered, and informed consent                            was obtained. Prior Anticoagulants: The patient                            last took Eliquis (apixaban) 3 days prior to the  procedure. ASA Grade Assessment: III - A patient                            with severe systemic disease. After reviewing the                            risks and benefits, the patient was deemed in                            satisfactory condition to undergo the procedure.                           After obtaining informed consent, the endoscope was                            passed under direct  vision. Throughout the                            procedure, the patient's blood pressure, pulse, and                            oxygen saturations were monitored continuously. The                            [id number]-[id number] ([id number]) scope was introduced through the                            mouth, and advanced to the second part of duodenum.                            The upper GI endoscopy was accomplished without                            difficulty. The patient tolerated the procedure                            well. Scope In: 9:42:28 AM Scope Out: 9:49:03 AM Total Procedure Duration: 0 hours 6 minutes 35 seconds  Findings:      Esophagitis was found. Distal esophageal erosions within 5 mm of the GE       junction. No Barrett's epithelium seen.      Multiple erosions were found in the entire examined stomach. No ulcer or       infiltrating process. However, patient had numerous respectable       erosions. Mucosa friable.This was biopsied with a cold forceps for       histology. Estimated blood loss was minimal.      Multiple erosions were found in the duodenal bulb and D2. Impression:               - Mild erosive reflux esophagitis.                           - Erosive gastropathy and enteropathy - fairly  extensive. Biopsied. Today's findings could be                            major contributing factors to anemia, bleeding and                            abdominal discomfort. Moderate Sedation:      Moderate (conscious) sedation was administered by the endoscopy nurse       and supervised by the endoscopist. The following parameters were       monitored: oxygen saturation, heart rate, blood pressure, respiratory       rate, EKG, adequacy of pulmonary ventilation, and response to care.       Total physician intraservice time was 20 minutes. Recommendation:           - Written discharge instructions were provided to                            the patient.                            - The signs and symptoms of potential delayed                            complications were discussed with the patient.                           - Patient has a contact number available for                            emergencies.                           - Return to normal activities tomorrow. Further                            recommendations to follow in the near future after                            biopsy report has been reviewed.                           - Advance diet as tolerated.                           - Resume Eliquis (apixaban) at prior dose today. Procedure Code(s):        --- Professional ---                           [phone], Esophagogastroduodenoscopy, flexible,                            transoral; with biopsy, single or multiple                           G0500, Moderate sedation services provided by the  same physician or other qualified health care                            professional performing a gastrointestinal                            endoscopic service that sedation supports,                            requiring the presence of an independent trained                            observer to assist in the monitoring of the                            patient's level of consciousness and physiological                            status; initial 15 minutes of intra-service time;                            patient age 75 years or older (additional time may                            be reported with [id number], as appropriate) Diagnosis Code(s):        --- Professional ---                           K20.9, Esophagitis, unspecified                           K31.89, Other diseases of stomach and duodenum                           R10.31, Right lower quadrant pain                           R19.5, Other fecal abnormalities CPT copyright 2017 [employer]. All rights reserved. The codes documented in this report are  preliminary and upon coder review may  be revised to meet current compliance requirements. [name]. [name], MD Norvel Richards, MD 03/24/2018 9:59:40 AM This report has been signed electronically. Number of Addenda: 0

## 2018-03-24 NOTE — Discharge Instructions (Signed)
EGD Discharge instructions Please read the instructions outlined below and refer to this sheet in the next few weeks. These discharge instructions provide you with general information on caring for yourself after you leave the hospital. Your doctor may also give you specific instructions. While your treatment has been planned according to the most current medical practices available, unavoidable complications occasionally occur. If you have any problems or questions after discharge, please call your doctor.  Dr [name]:  [phone].  After hours and weekends call the hospital and have the GI doctor on call paged.  They will call you back. ACTIVITY  You may resume your regular activity but move at a slower pace for the next 24 hours.   Take frequent rest periods for the next 24 hours.   Walking will help expel (get rid of) the air and reduce the bloated feeling in your abdomen.   No driving for 24 hours (because of the anesthesia (medicine) used during the test).   You may shower.   Do not sign any important legal documents or operate any machinery for 24 hours (because of the anesthesia used during the test).  NUTRITION  Drink plenty of fluids.   You may resume your normal diet.   Begin with a light meal and progress to your normal diet.   Avoid alcoholic beverages for 24 hours or as instructed by your caregiver.  MEDICATIONS  You may resume your normal medications unless your caregiver tells you otherwise.  WHAT YOU CAN EXPECT TODAY  You may experience abdominal discomfort such as a feeling of fullness or gas pains.  FOLLOW-UP  Your doctor will discuss the results of your test with you.  SEEK IMMEDIATE MEDICAL ATTENTION IF ANY OF THE FOLLOWING OCCUR:  Excessive nausea (feeling sick to your stomach) and/or vomiting.   Severe abdominal pain and distention (swelling).   Trouble swallowing.   Temperature over 101 F (37.8 C).   Rectal bleeding or vomiting of blood.     Further recommendations to follow  Pending review of pathology report.  Resume Eliquis today

## 2018-03-24 NOTE — H&P (Signed)
@LOGO @   Primary Care Physician:  Sharilyn Sites, MD Primary Gastroenterologist:  Dr. [name]  Pre-Procedure History & Physical: HPI:  [name] is a 72 y.o. female here for chronic upper abdominal pain. Patient denies dysphagia. Last eliquis 3 days ago.  Past Medical History:  Diagnosis Date  . Anemia   . Arthritis   . Asthma   . Atrial fibrillation ([place])   . Atrial flutter [facility])    TEE/DCCV December 2013 - on Xarelto [facility])  . Bone spur of toe    Right 5th toe  . Chronic diastolic CHF (congestive heart failure) (HCC)    a. EF 50-55% by echo in 2015  . CKD (chronic kidney disease) stage 4, GFR 15-29 ml/min (HCC)    a. s/p fistula placement but not on HD. Followed by Nephrology.   . Colon polyposis   . COPD (chronic obstructive pulmonary disease) ([place])   . DVT of upper extremity (deep vein thrombosis) ([place])    Right basilic vein, June 9935  . Essential hypertension, benign   . Fractures   . Glaucoma   . SLE (systemic lupus erythematosus) ([place])   . Type 2 diabetes mellitus ([place])   . UTI (lower urinary tract infection)   . Wound of right leg 06/2017    Past Surgical History:  Procedure Laterality Date  . ABDOMINAL AORTOGRAM W/LOWER EXTREMITY N/A 12/18/2017   Procedure: ABDOMINAL AORTOGRAM W/LOWER EXTREMITY;  Surgeon: Elam Dutch, MD;  Location: [place] [facility];  Service: Cardiovascular;  Laterality: N/A;  bilateral  . ABDOMINAL HYSTERECTOMY  1983  . ANKLE SURGERY  1993  . AV FISTULA PLACEMENT Left 03/27/2014   Procedure: ARTERIOVENOUS (AV) FISTULA CREATION;  Surgeon: Rosetta Posner, MD;  Location: [place];  Service: Vascular;  Laterality: Left;  . BACK SURGERY  1980  . BASCILIC VEIN TRANSPOSITION Left 01/13/2018   Procedure: LEFT BASILIC VEIN TRANSPOSITION SECOND STAGE;  Surgeon: Elam Dutch, MD;  Location: [place];  Service: Vascular;  Laterality: Left;  . CARDIOVASCULAR STRESS TEST  12/19/2009   no stress induced rhythm abnormalities, ekg negative for  ischemia  . CARDIOVERSION  09/16/2012   Procedure: CARDIOVERSION;  Surgeon: Sanda Klein, MD;  [facility];  Service: Cardiovascular;  Laterality: N/A;  . CATARACT EXTRACTION    . COLONOSCOPY  09/20/2012   Dr. [name]: multiple tubular adenomas   . COLONOSCOPY  2005   Dr. [name]. Polyps, path unknown   . COLONOSCOPY N/A 07/24/2016   Procedure: COLONOSCOPY;  Surgeon: [name], MD;  [facility];  Service: Endoscopy;  Laterality: N/A;  9:00 am  . ESOPHAGOGASTRODUODENOSCOPY  09/20/2012   Dr. [name]: duodenal erosion and possible resolving ulcer at the angularis   . EYE SURGERY    . IR FLUORO GUIDE CV LINE RIGHT  11/09/2017  . IR US GUIDE VASC ACCESS RIGHT  11/09/2017  . REVISON OF ARTERIOVENOUS FISTULA Left 03/15/2018   Procedure: REVISION OF Left arm BASILIC VEIN TRANSPOSITION;  Surgeon: Angelia Mould, MD;  Location: [place];  Service: Vascular;  Laterality: Left;  . TEE WITHOUT CARDIOVERSION  09/16/2012   Procedure: TRANSESOPHAGEAL ECHOCARDIOGRAM (TEE);  Surgeon: Sanda Klein, MD;  Location: [facility] ENDOS[facility];  Service: Cardiovascular;  Laterality: N/A;  . TRANSESOPHAGEAL ECHOCARDIOGRAM WITH CARDIOVERSION  09/16/2012   EF 60-65%, moderate LVH, moderate regurg of the aortic valve, LA moderately dilated    Prior to Admission medications   Medication Sig Start Date End Date Taking? Authorizing Provider  acetaminophen (TYLENOL) 500  MG tablet Take 1,000 mg by mouth every 6 (six) hours as needed. For pain   Yes [provider]  Amino Acids-Protein Hydrolys (FEEDING SUPPLEMENT, PRO-STAT SUGAR FREE 64,) LIQD Take 30 mLs by mouth 2 (two) times daily. Patient taking differently: Take 30 mLs by mouth 3 (three) times daily with meals.  12/29/17  Yes Danford, Suann Larry, MD  atorvastatin (LIPITOR) 20 MG tablet Take 20 mg by mouth at bedtime.    Yes [provider]  brimonidine-timolol (COMBIGAN) 0.2-0.5 % ophthalmic solution Place 1 drop into the left  eye daily.    Yes [provider]  calcium acetate (PHOSLO) 667 MG capsule Take 667 mg by mouth 3 (three) times daily with meals.   Yes [provider]  dicyclomine (BENTYL) 10 MG capsule Take 10 mg by mouth 4 (four) times daily -  before meals and at bedtime.   Yes [provider]  febuxostat (ULORIC) 40 MG tablet Take 40 mg by mouth daily.   Yes [provider]  folic acid (FOLVITE) 1 MG tablet Take 1 mg by mouth daily.   Yes [provider]  hydroxychloroquine (PLAQUENIL) 200 MG tablet Take 300 mg by mouth as directed. Take 1 1/2 tablets daily   Yes [provider]  Insulin Detemir (LEVEMIR FLEXTOUCH) 100 UNIT/ML Pen Inject 8 Units into the skin every morning.    Yes [provider]  insulin lispro (HUMALOG KWIKPEN) 100 UNIT/ML KiwkPen Inject 5 Units into the skin 3 (three) times daily with meals as needed (*Only take if blood sugar levels are over 150).    Yes [provider]  metoprolol tartrate (LOPRESSOR) 50 MG tablet Take 50 mg by mouth See admin instructions. Take 1 tablet twice a day, except on Tuesday, Thursday, Saturday, only take 1 tablet daily in the evening   Yes [provider]  Multiple Vitamins-Minerals (CENTRUM SILVER PO) Take 1 tablet by mouth daily.   Yes [provider]  predniSONE (DELTASONE) 5 MG tablet Take 5 mg by mouth daily with breakfast.   Yes [provider]  silver sulfADIAZINE (SILVADENE) 1 % cream Apply 1 application topically daily.   Yes [provider]  travoprost, benzalkonium, (TRAVATAN) 0.004 % ophthalmic solution Place 1 drop into the left eye at bedtime.   Yes [provider]  albuterol (ACCUNEB) 0.63 MG/3ML nebulizer solution Take 1 ampule by nebulization every 6 (six) hours as needed for wheezing or shortness of breath.     [provider]  albuterol (PROVENTIL HFA;VENTOLIN HFA) 108 (90 BASE) MCG/ACT inhaler Inhale 2 puffs into the  lungs every 6 (six) hours as needed. For shortness of breath    [provider]  apixaban (ELIQUIS) 5 MG TABS tablet Take 1 tablet (5 mg total) by mouth 2 (two) times daily. 12/29/17   Danford, Suann Larry, MD  oxyCODONE (ROXICODONE) 5 MG immediate release tablet Take 1 tablet (5 mg total) by mouth every 4 (four) hours as needed for severe pain. 03/15/18   Angelia Mould, MD  oxyCODONE-acetaminophen (PERCOCET/ROXICET) 5-325 MG tablet Take 1 tablet by mouth every 6 (six) hours as needed for severe pain. 01/13/18   Elam Dutch, MD    Allergies as of 01/20/2018 - Review Complete 01/16/2018  Allergen Reaction Noted  . Ace inhibitors Cough 06/15/2015    Family History  Problem Relation Age of Onset  . Diabetes Brother   . Diabetes Father   . Hypertension Father   . Hypertension Sister   .  Stroke Mother   . Diabetes Sister   . Hypertension Brother   . Colon cancer Neg Hx     Social History   Socioeconomic History  . Marital status: Married    Spouse name: Not on file  . Number of children: Not on file  . Years of education: Not on file  . Highest education level: Not on file  Occupational History  . Not on file  Social Needs  . Financial resource strain: Not on file  . Food insecurity:    Worry: Not on file    Inability: Not on file  . Transportation needs:    Medical: Not on file    Non-medical: Not on file  Tobacco Use  . Smoking status: Former Smoker    Packs/day: 1.00    Years: 30.00    Pack years: 30.00    Types: Cigarettes    Last attempt to quit: 08/29/2012    Years since quitting: 5.5  . Smokeless tobacco: Never Used  . Tobacco comment: quit 08-2012  Substance and Sexual Activity  . Alcohol use: No    Alcohol/week: 0.0 oz  . Drug use: No  . Sexual activity: Yes    Partners: Male  Lifestyle  . Physical activity:    Days per week: Not on file    Minutes per session: Not on file  . Stress: Not on file  Relationships  . Social  connections:    Talks on phone: Not on file    Gets together: Not on file    Attends religious service: Not on file    Active member of club or organization: Not on file    Attends meetings of clubs or organizations: Not on file    Relationship status: Not on file  . Intimate partner violence:    Fear of current or ex partner: Not on file    Emotionally abused: Not on file    Physically abused: Not on file    Forced sexual activity: Not on file  Other Topics Concern  . Not on file  Social History Narrative  . Not on file    Review of Systems: See HPI, otherwise negative ROS  Physical Exam: BP (!) 131/57   Pulse 71   Temp (!) 97.5 F (36.4 C) (Oral)   Resp 18   Ht 5\' 5"  (1.651 m)   Wt 175 lb (79.4 kg)   BMI 29.12 kg/m  General:   Alert,  Well-developed, well-nourished, pleasant and cooperative in NAD Neck:  Supple; no masses or thyromegaly. No significant cervical adenopathy. Lungs:  Clear throughout to auscultation.   No wheezes, crackles, or rhonchi. No acute distress. Heart:  Regular rate and rhythm; no murmurs, clicks, rubs,  or gallops. Abdomen: Non-distended, normal bowel sounds.  Soft and nontender without appreciable mass or hepatosplenomegaly.  Pulses:  Normal pulses noted. Extremities:  Without clubbing or edema.  Impression/Plan:    72 year old gentleman with Hemoccult positive stool, chronic upper abdominal pain, chronic anticoagulation. Further evaluation via EGD today. The risks, benefits, limitations, alternatives and imponderables have been reviewed with the patient. Potential for esophageal dilation, biopsy, etc. have also been reviewed.  Questions have been answered. All parties agreeable.    Notice: This dictation was prepared with Dragon dictation along with smaller phrase technology. Any transcriptional errors that result from this process are unintentional and may not be corrected upon review.

## 2018-03-25 DIAGNOSIS — D509 Iron deficiency anemia, unspecified: Secondary | ICD-10-CM | POA: Diagnosis not present

## 2018-03-25 DIAGNOSIS — N186 End stage renal disease: Secondary | ICD-10-CM | POA: Diagnosis not present

## 2018-03-25 DIAGNOSIS — D631 Anemia in chronic kidney disease: Secondary | ICD-10-CM | POA: Diagnosis not present

## 2018-03-25 DIAGNOSIS — E876 Hypokalemia: Secondary | ICD-10-CM | POA: Diagnosis not present

## 2018-03-25 DIAGNOSIS — E1129 Type 2 diabetes mellitus with other diabetic kidney complication: Secondary | ICD-10-CM | POA: Diagnosis not present

## 2018-03-25 DIAGNOSIS — D689 Coagulation defect, unspecified: Secondary | ICD-10-CM | POA: Diagnosis not present

## 2018-03-25 DIAGNOSIS — N2581 Secondary hyperparathyroidism of renal origin: Secondary | ICD-10-CM | POA: Diagnosis not present

## 2018-03-26 ENCOUNTER — Encounter: Payer: Self-pay | Admitting: Internal Medicine

## 2018-03-26 ENCOUNTER — Encounter (HOSPITAL_COMMUNITY): Payer: Self-pay | Admitting: Internal Medicine

## 2018-03-26 DIAGNOSIS — I4891 Unspecified atrial fibrillation: Secondary | ICD-10-CM | POA: Diagnosis not present

## 2018-03-26 DIAGNOSIS — M150 Primary generalized (osteo)arthritis: Secondary | ICD-10-CM | POA: Diagnosis not present

## 2018-03-26 DIAGNOSIS — N186 End stage renal disease: Secondary | ICD-10-CM | POA: Diagnosis not present

## 2018-03-26 DIAGNOSIS — M329 Systemic lupus erythematosus, unspecified: Secondary | ICD-10-CM | POA: Diagnosis not present

## 2018-03-26 DIAGNOSIS — J449 Chronic obstructive pulmonary disease, unspecified: Secondary | ICD-10-CM | POA: Diagnosis not present

## 2018-03-26 DIAGNOSIS — M109 Gout, unspecified: Secondary | ICD-10-CM | POA: Diagnosis not present

## 2018-03-26 DIAGNOSIS — E785 Hyperlipidemia, unspecified: Secondary | ICD-10-CM | POA: Diagnosis not present

## 2018-03-26 DIAGNOSIS — Z87891 Personal history of nicotine dependence: Secondary | ICD-10-CM | POA: Diagnosis not present

## 2018-03-26 DIAGNOSIS — E1122 Type 2 diabetes mellitus with diabetic chronic kidney disease: Secondary | ICD-10-CM | POA: Diagnosis not present

## 2018-03-26 DIAGNOSIS — I872 Venous insufficiency (chronic) (peripheral): Secondary | ICD-10-CM | POA: Diagnosis not present

## 2018-03-26 DIAGNOSIS — H409 Unspecified glaucoma: Secondary | ICD-10-CM | POA: Diagnosis not present

## 2018-03-26 DIAGNOSIS — Z794 Long term (current) use of insulin: Secondary | ICD-10-CM | POA: Diagnosis not present

## 2018-03-26 DIAGNOSIS — Z992 Dependence on renal dialysis: Secondary | ICD-10-CM | POA: Diagnosis not present

## 2018-03-27 DIAGNOSIS — N2581 Secondary hyperparathyroidism of renal origin: Secondary | ICD-10-CM | POA: Diagnosis not present

## 2018-03-27 DIAGNOSIS — E1129 Type 2 diabetes mellitus with other diabetic kidney complication: Secondary | ICD-10-CM | POA: Diagnosis not present

## 2018-03-27 DIAGNOSIS — D631 Anemia in chronic kidney disease: Secondary | ICD-10-CM | POA: Diagnosis not present

## 2018-03-27 DIAGNOSIS — D509 Iron deficiency anemia, unspecified: Secondary | ICD-10-CM | POA: Diagnosis not present

## 2018-03-27 DIAGNOSIS — D689 Coagulation defect, unspecified: Secondary | ICD-10-CM | POA: Diagnosis not present

## 2018-03-27 DIAGNOSIS — E876 Hypokalemia: Secondary | ICD-10-CM | POA: Diagnosis not present

## 2018-03-27 DIAGNOSIS — N186 End stage renal disease: Secondary | ICD-10-CM | POA: Diagnosis not present

## 2018-03-28 DIAGNOSIS — N186 End stage renal disease: Secondary | ICD-10-CM | POA: Diagnosis not present

## 2018-03-28 DIAGNOSIS — Z992 Dependence on renal dialysis: Secondary | ICD-10-CM | POA: Diagnosis not present

## 2018-03-28 DIAGNOSIS — E1129 Type 2 diabetes mellitus with other diabetic kidney complication: Secondary | ICD-10-CM | POA: Diagnosis not present

## 2018-03-29 ENCOUNTER — Telehealth: Payer: Self-pay

## 2018-03-29 ENCOUNTER — Other Ambulatory Visit: Payer: Self-pay

## 2018-03-29 DIAGNOSIS — Z992 Dependence on renal dialysis: Secondary | ICD-10-CM | POA: Diagnosis not present

## 2018-03-29 DIAGNOSIS — J449 Chronic obstructive pulmonary disease, unspecified: Secondary | ICD-10-CM | POA: Diagnosis not present

## 2018-03-29 DIAGNOSIS — E1122 Type 2 diabetes mellitus with diabetic chronic kidney disease: Secondary | ICD-10-CM | POA: Diagnosis not present

## 2018-03-29 DIAGNOSIS — E785 Hyperlipidemia, unspecified: Secondary | ICD-10-CM | POA: Diagnosis not present

## 2018-03-29 DIAGNOSIS — M109 Gout, unspecified: Secondary | ICD-10-CM | POA: Diagnosis not present

## 2018-03-29 DIAGNOSIS — I4891 Unspecified atrial fibrillation: Secondary | ICD-10-CM | POA: Diagnosis not present

## 2018-03-29 DIAGNOSIS — Z87891 Personal history of nicotine dependence: Secondary | ICD-10-CM | POA: Diagnosis not present

## 2018-03-29 DIAGNOSIS — I872 Venous insufficiency (chronic) (peripheral): Secondary | ICD-10-CM | POA: Diagnosis not present

## 2018-03-29 DIAGNOSIS — H409 Unspecified glaucoma: Secondary | ICD-10-CM | POA: Diagnosis not present

## 2018-03-29 DIAGNOSIS — Z794 Long term (current) use of insulin: Secondary | ICD-10-CM | POA: Diagnosis not present

## 2018-03-29 DIAGNOSIS — N186 End stage renal disease: Secondary | ICD-10-CM | POA: Diagnosis not present

## 2018-03-29 DIAGNOSIS — M150 Primary generalized (osteo)arthritis: Secondary | ICD-10-CM | POA: Diagnosis not present

## 2018-03-29 DIAGNOSIS — M329 Systemic lupus erythematosus, unspecified: Secondary | ICD-10-CM | POA: Diagnosis not present

## 2018-03-29 MED ORDER — PANTOPRAZOLE SODIUM 40 MG PO TBEC: 40.0000 mg | DELAYED_RELEASE_TABLET | Freq: Every day | ORAL | 11 refills | Status: AC

## 2018-03-29 NOTE — Telephone Encounter (Signed)
Per RMR Patient needs to be treated for reflux. Let's call in a prescription for Protonix 40 mg once daily. Dispense 30. 11 refills.   Please advise patient to avoid all nonsteroidal agents. See biopsy / letter report.

## 2018-03-29 NOTE — Telephone Encounter (Signed)
Low-dose prednisone for non-GI disorder should be okay.

## 2018-03-29 NOTE — Telephone Encounter (Signed)
Pts spouse notified of results. Protonix 40 mg take once daily # 30 with 11 rfs was sent to pts pharmacy. Pts Spouse wants to know if pt can continue Prednisone 5mg ? Pt use to take a higher dosage of prednisone and was reduced to 5mg  daily. Pts spouse wants to make sure pt avoids all medications that could cause problems.

## 2018-03-29 NOTE — Telephone Encounter (Signed)
Per RMR- Send letter to patient.  Send copy of letter with path to referring provider and PCP.    Needs an appointment with [place] in about 2-3 months if not already scheduled.

## 2018-03-29 NOTE — Telephone Encounter (Signed)
Noted. Spouse notified.  

## 2018-03-30 ENCOUNTER — Encounter: Payer: Self-pay | Admitting: Internal Medicine

## 2018-03-30 DIAGNOSIS — E876 Hypokalemia: Secondary | ICD-10-CM | POA: Diagnosis not present

## 2018-03-30 DIAGNOSIS — D631 Anemia in chronic kidney disease: Secondary | ICD-10-CM | POA: Diagnosis not present

## 2018-03-30 DIAGNOSIS — E039 Hypothyroidism, unspecified: Secondary | ICD-10-CM | POA: Diagnosis not present

## 2018-03-30 DIAGNOSIS — N186 End stage renal disease: Secondary | ICD-10-CM | POA: Diagnosis not present

## 2018-03-30 DIAGNOSIS — N2581 Secondary hyperparathyroidism of renal origin: Secondary | ICD-10-CM | POA: Diagnosis not present

## 2018-03-30 DIAGNOSIS — D509 Iron deficiency anemia, unspecified: Secondary | ICD-10-CM | POA: Diagnosis not present

## 2018-03-30 DIAGNOSIS — E1129 Type 2 diabetes mellitus with other diabetic kidney complication: Secondary | ICD-10-CM | POA: Diagnosis not present

## 2018-03-30 DIAGNOSIS — D689 Coagulation defect, unspecified: Secondary | ICD-10-CM | POA: Diagnosis not present

## 2018-03-30 NOTE — Telephone Encounter (Signed)
OV made and letter mailed °

## 2018-03-31 ENCOUNTER — Encounter: Payer: Medicare Other | Attending: Internal Medicine | Admitting: Internal Medicine

## 2018-03-31 ENCOUNTER — Encounter: Payer: Self-pay | Admitting: Orthopaedic Surgery

## 2018-03-31 ENCOUNTER — Ambulatory Visit (INDEPENDENT_AMBULATORY_CARE_PROVIDER_SITE_OTHER): Payer: Medicare Other | Admitting: Orthopaedic Surgery

## 2018-03-31 ENCOUNTER — Ambulatory Visit (INDEPENDENT_AMBULATORY_CARE_PROVIDER_SITE_OTHER): Payer: Medicare Other

## 2018-03-31 VITALS — BP 113/47 | HR 73 | Ht 64.0 in | Wt 175.0 lb

## 2018-03-31 DIAGNOSIS — I509 Heart failure, unspecified: Secondary | ICD-10-CM | POA: Diagnosis not present

## 2018-03-31 DIAGNOSIS — M25551 Pain in right hip: Secondary | ICD-10-CM | POA: Diagnosis not present

## 2018-03-31 DIAGNOSIS — J449 Chronic obstructive pulmonary disease, unspecified: Secondary | ICD-10-CM | POA: Diagnosis not present

## 2018-03-31 DIAGNOSIS — M109 Gout, unspecified: Secondary | ICD-10-CM | POA: Insufficient documentation

## 2018-03-31 DIAGNOSIS — E1122 Type 2 diabetes mellitus with diabetic chronic kidney disease: Secondary | ICD-10-CM | POA: Insufficient documentation

## 2018-03-31 DIAGNOSIS — N186 End stage renal disease: Secondary | ICD-10-CM | POA: Diagnosis not present

## 2018-03-31 DIAGNOSIS — Z7901 Long term (current) use of anticoagulants: Secondary | ICD-10-CM | POA: Diagnosis not present

## 2018-03-31 DIAGNOSIS — N185 Chronic kidney disease, stage 5: Secondary | ICD-10-CM | POA: Insufficient documentation

## 2018-03-31 DIAGNOSIS — L89610 Pressure ulcer of right heel, unstageable: Secondary | ICD-10-CM | POA: Diagnosis not present

## 2018-03-31 DIAGNOSIS — L942 Calcinosis cutis: Secondary | ICD-10-CM | POA: Diagnosis not present

## 2018-03-31 DIAGNOSIS — M7061 Trochanteric bursitis, right hip: Secondary | ICD-10-CM

## 2018-03-31 DIAGNOSIS — L89899 Pressure ulcer of other site, unspecified stage: Secondary | ICD-10-CM | POA: Diagnosis not present

## 2018-03-31 DIAGNOSIS — Z794 Long term (current) use of insulin: Secondary | ICD-10-CM | POA: Diagnosis not present

## 2018-03-31 DIAGNOSIS — M069 Rheumatoid arthritis, unspecified: Secondary | ICD-10-CM | POA: Insufficient documentation

## 2018-03-31 DIAGNOSIS — E785 Hyperlipidemia, unspecified: Secondary | ICD-10-CM | POA: Diagnosis not present

## 2018-03-31 DIAGNOSIS — I132 Hypertensive heart and chronic kidney disease with heart failure and with stage 5 chronic kidney disease, or end stage renal disease: Secondary | ICD-10-CM | POA: Diagnosis not present

## 2018-03-31 DIAGNOSIS — I872 Venous insufficiency (chronic) (peripheral): Secondary | ICD-10-CM | POA: Diagnosis not present

## 2018-03-31 DIAGNOSIS — M329 Systemic lupus erythematosus, unspecified: Secondary | ICD-10-CM | POA: Diagnosis not present

## 2018-03-31 DIAGNOSIS — Z87891 Personal history of nicotine dependence: Secondary | ICD-10-CM | POA: Diagnosis not present

## 2018-03-31 DIAGNOSIS — H409 Unspecified glaucoma: Secondary | ICD-10-CM | POA: Diagnosis not present

## 2018-03-31 DIAGNOSIS — Z992 Dependence on renal dialysis: Secondary | ICD-10-CM | POA: Diagnosis not present

## 2018-03-31 DIAGNOSIS — I4891 Unspecified atrial fibrillation: Secondary | ICD-10-CM | POA: Diagnosis not present

## 2018-03-31 DIAGNOSIS — L97212 Non-pressure chronic ulcer of right calf with fat layer exposed: Secondary | ICD-10-CM | POA: Diagnosis not present

## 2018-03-31 DIAGNOSIS — M150 Primary generalized (osteo)arthritis: Secondary | ICD-10-CM | POA: Diagnosis not present

## 2018-03-31 NOTE — Progress Notes (Signed)
Patient [name], female DOB:[birth year], 72 y.o. EHM:[id number]  Chief Complaint  Patient presents with  . Hip Pain    Right    HPI  [name] is a 72 y.o. female who has pain of the right hip trochanteric area again. I saw her for this in December of 2018.  She has had multiple medical problems since then.  She has an ulceration of the right foot and lower leg and is seeing the wound clinic.  She has no new trauma, no redness of the hip.  It began bothering her about a couple of weeks ago. She has some swelling in the area now.   Body mass index is 30.04 kg/m.  ROS  Review of Systems  Constitutional: Positive for activity change.  HENT: Negative for congestion.   Respiratory: Positive for shortness of breath. Negative for cough.   Cardiovascular: Positive for palpitations. Negative for chest pain and leg swelling.  Gastrointestinal: Positive for constipation.  Endocrine: Positive for cold intolerance.  Musculoskeletal: Positive for arthralgias, back pain, gait problem and myalgias.  Skin: Positive for rash.  Allergic/Immunologic: Positive for environmental allergies.  Psychiatric/Behavioral: The patient is nervous/anxious.   All other systems reviewed and are negative.   All other systems reviewed and are negative.  Past Medical History:  Diagnosis Date  . Anemia   . Arthritis   . Asthma   . Atrial fibrillation ([place])   . Atrial flutter [facility]    TEE/DCCV December 2013 - on Xarelto [facility])  . Bone spur of toe    Right 5th toe  . Chronic diastolic CHF (congestive heart failure) (HCC)    a. EF 50-55% by echo in 2015  . CKD (chronic kidney disease) stage 4, GFR 15-29 ml/min (HCC)    a. s/p fistula placement but not on HD. Followed by Nephrology.   . Colon polyposis   . COPD (chronic obstructive pulmonary disease) ([place])   . DVT of upper extremity (deep vein thrombosis) ([place])    Right basilic vein, June 3662  . Essential hypertension, benign   .  Fractures   . Glaucoma   . SLE (systemic lupus erythematosus) ([place])   . Type 2 diabetes mellitus ([place])   . UTI (lower urinary tract infection)   . Wound of right leg 06/2017    Past Surgical History:  Procedure Laterality Date  . ABDOMINAL AORTOGRAM W/LOWER EXTREMITY N/A 12/18/2017   Procedure: ABDOMINAL AORTOGRAM W/LOWER EXTREMITY;  Surgeon: Elam Dutch, MD;  Location: [place] [facility];  Service: Cardiovascular;  Laterality: N/A;  bilateral  . ABDOMINAL HYSTERECTOMY  1983  . ANKLE SURGERY  1993  . AV FISTULA PLACEMENT Left 03/27/2014   Procedure: ARTERIOVENOUS (AV) FISTULA CREATION;  Surgeon: Rosetta Posner, MD;  Location: [place];  Service: Vascular;  Laterality: Left;  . BACK SURGERY  1980  . BASCILIC VEIN TRANSPOSITION Left 01/13/2018   Procedure: LEFT BASILIC VEIN TRANSPOSITION SECOND STAGE;  Surgeon: Elam Dutch, MD;  Location: [place];  Service: Vascular;  Laterality: Left;  . CARDIOVASCULAR STRESS TEST  12/19/2009   no stress induced rhythm abnormalities, ekg negative for ischemia  . CARDIOVERSION  09/16/2012   Procedure: CARDIOVERSION;  Surgeon: Sanda Klein, MD;  [facility];  Service: Cardiovascular;  Laterality: N/A;  . CATARACT EXTRACTION    . COLONOSCOPY  09/20/2012   Dr. [name]: multiple tubular adenomas   . COLONOSCOPY  2005   Dr. [name]. Polyps, path unknown   . COLONOSCOPY N/A 07/24/2016  Procedure: COLONOSCOPY;  Surgeon: [name], MD;  [facility];  Service: Endoscopy;  Laterality: N/A;  9:00 am  . ESOPHAGOGASTRODUODENOSCOPY  09/20/2012   Dr. [name]: duodenal erosion and possible resolving ulcer at the angularis   . ESOPHAGOGASTRODUODENOSCOPY N/A 03/24/2018   Procedure: ESOPHAGOGASTRODUODENOSCOPY (EGD);  Surgeon: [name], MD;  [facility];  Service: Endoscopy;  Laterality: N/A;  12:45pm- moved up to arrive at 8:30am per Tretha Sciara  . EYE SURGERY    . IR FLUORO GUIDE CV LINE RIGHT  11/09/2017  . IR US GUIDE VASC  ACCESS RIGHT  11/09/2017  . REVISON OF ARTERIOVENOUS FISTULA Left 03/15/2018   Procedure: REVISION OF Left arm BASILIC VEIN TRANSPOSITION;  Surgeon: Angelia Mould, MD;  Location: [place];  Service: Vascular;  Laterality: Left;  . TEE WITHOUT CARDIOVERSION  09/16/2012   Procedure: TRANSESOPHAGEAL ECHOCARDIOGRAM (TEE);  Surgeon: Sanda Klein, MD;  Location: [facility] ENDOSCOPY;  Service: Cardiovascular;  Laterality: N/A;  . TRANSESOPHAGEAL ECHOCARDIOGRAM WITH CARDIOVERSION  09/16/2012   EF 60-65%, moderate LVH, moderate regurg of the aortic valve, LA moderately dilated    Family History  Problem Relation Age of Onset  . Diabetes Brother   . Diabetes Father   . Hypertension Father   . Hypertension Sister   . Stroke Mother   . Diabetes Sister   . Hypertension Brother   . Colon cancer Neg Hx     Social History Social History   Tobacco Use  . Smoking status: Former Smoker    Packs/day: 1.00    Years: 30.00    Pack years: 30.00    Types: Cigarettes    Last attempt to quit: 08/29/2012    Years since quitting: 5.5  . Smokeless tobacco: Never Used  . Tobacco comment: quit 08-2012  Substance Use Topics  . Alcohol use: No    Alcohol/week: 0.0 oz  . Drug use: No    Allergies  Allergen Reactions  . Ace Inhibitors Cough    Current Outpatient Medications  Medication Sig Dispense Refill  . acetaminophen (TYLENOL) 500 MG tablet Take 1,000 mg by mouth every 6 (six) hours as needed. For pain    . albuterol (ACCUNEB) 0.63 MG/3ML nebulizer solution Take 1 ampule by nebulization every 6 (six) hours as needed for wheezing or shortness of breath.     Marland Kitchen albuterol (PROVENTIL HFA;VENTOLIN HFA) 108 (90 BASE) MCG/ACT inhaler Inhale 2 puffs into the lungs every 6 (six) hours as needed. For shortness of breath    . Amino Acids-Protein Hydrolys (FEEDING SUPPLEMENT, PRO-STAT SUGAR FREE 64,) LIQD Take 30 mLs by mouth 2 (two) times daily. (Patient taking differently: Take 30 mLs by mouth 3 (three) times  daily with meals. ) 900 mL 0  . apixaban (ELIQUIS) 5 MG TABS tablet Take 1 tablet (5 mg total) by mouth 2 (two) times daily. 60 tablet 6  . atorvastatin (LIPITOR) 20 MG tablet Take 20 mg by mouth at bedtime.     . brimonidine-timolol (COMBIGAN) 0.2-0.5 % ophthalmic solution Place 1 drop into the left eye daily.     . calcium acetate (PHOSLO) 667 MG capsule Take 667 mg by mouth 3 (three) times daily with meals.    . dicyclomine (BENTYL) 10 MG capsule Take 10 mg by mouth 4 (four) times daily -  before meals and at bedtime.    . febuxostat (ULORIC) 40 MG tablet Take 40 mg by mouth daily.    . folic acid (FOLVITE) 1 MG tablet Take 1  mg by mouth daily.    . hydroxychloroquine (PLAQUENIL) 200 MG tablet Take 300 mg by mouth as directed. Take 1 1/2 tablets daily    . Insulin Detemir (LEVEMIR FLEXTOUCH) 100 UNIT/ML Pen Inject 8 Units into the skin every morning.     . insulin lispro (HUMALOG KWIKPEN) 100 UNIT/ML KiwkPen Inject 5 Units into the skin 3 (three) times daily with meals as needed (*Only take if blood sugar levels are over 150).     . metoprolol tartrate (LOPRESSOR) 50 MG tablet Take 50 mg by mouth See admin instructions. Take 1 tablet twice a day, except on Tuesday, Thursday, Saturday, only take 1 tablet daily in the evening    . Multiple Vitamins-Minerals (CENTRUM SILVER PO) Take 1 tablet by mouth daily.    Marland Kitchen oxyCODONE (ROXICODONE) 5 MG immediate release tablet Take 1 tablet (5 mg total) by mouth every 4 (four) hours as needed for severe pain. 12 tablet 0  . oxyCODONE-acetaminophen (PERCOCET/ROXICET) 5-325 MG tablet Take 1 tablet by mouth every 6 (six) hours as needed for severe pain. 20 tablet 0  . pantoprazole (PROTONIX) 40 MG tablet Take 1 tablet (40 mg total) by mouth daily. 30 tablet 11  . predniSONE (DELTASONE) 5 MG tablet Take 5 mg by mouth daily with breakfast.    . silver sulfADIAZINE (SILVADENE) 1 % cream Apply 1 application topically daily.    . travoprost, benzalkonium, (TRAVATAN)  0.004 % ophthalmic solution Place 1 drop into the left eye at bedtime.     No current facility-administered medications for this visit.      Physical Exam  Blood pressure (!) 113/47, pulse 73, height 5\' 4"  (1.626 m), weight 175 lb (79.4 kg).  Constitutional: overall normal hygiene, normal nutrition, well developed, normal grooming, normal body habitus. Assistive device:wheelchair  Musculoskeletal: gait and station Limp can hardly walk, standing is difficult, muscle tone and strength are normal, no tremors or atrophy is present.  .  Neurological: coordination overall normal.  Deep tendon reflex/nerve stretch intact.  Sensation normal.  Cranial nerves II-XII intact.   Skin:   Normal overall no scars, lesions, ulcers or rashes. No psoriasis.  Psychiatric: Alert and oriented x 3.  Recent memory intact, remote memory unclear.  Normal mood and affect. Well groomed.  Good eye contact.  Cardiovascular: overall no swelling, no varicosities, no edema bilaterally, normal temperatures of the legs and arms, no clubbing, cyanosis and good capillary refill.  Lymphatic: palpation is normal.  Right hip area has tenderness of the right trochanteric bursa area.  ROM is full. She has a small cystic lesion about 1 cm by 3 cm at the trochanteric area.  It is not red, not fluctuant, not moveable.  All other systems reviewed and are negative   X-rays were done of the right hip, reported separately.  The patient has been educated about the nature of the problem(s) and counseled on treatment options.  The patient appeared to understand what I have discussed and is in agreement with it.  Encounter Diagnoses  Name Primary?  . Trochanteric bursitis of right hip Yes  . On warfarin therapy   . Pain of right hip joint    PROCEDURE NOTE:  The patient request injection, verbal consent was obtained.  The right trochanteric area of the hip was prepped appropriately after time out was performed.   Sterile  technique was observed and injection of 1 cc of Depo-Medrol 40 mg with several cc's of plain xylocaine. Anesthesia was provided by ethyl chloride  and a 20-gauge needle was used to inject the hip area. The injection was tolerated well.  A band aid dressing was applied.  The patient was advised to apply ice later today and tomorrow to the injection sight as needed.  PLAN Call if any problems.  Precautions discussed.  Continue current medications.   Return to clinic as needed.   Electronically Signed Sanjuana Kava, MD 7/3/201911:13 AM

## 2018-04-01 DIAGNOSIS — E876 Hypokalemia: Secondary | ICD-10-CM | POA: Diagnosis not present

## 2018-04-01 DIAGNOSIS — D689 Coagulation defect, unspecified: Secondary | ICD-10-CM | POA: Diagnosis not present

## 2018-04-01 DIAGNOSIS — N186 End stage renal disease: Secondary | ICD-10-CM | POA: Diagnosis not present

## 2018-04-01 DIAGNOSIS — E039 Hypothyroidism, unspecified: Secondary | ICD-10-CM | POA: Diagnosis not present

## 2018-04-01 DIAGNOSIS — N2581 Secondary hyperparathyroidism of renal origin: Secondary | ICD-10-CM | POA: Diagnosis not present

## 2018-04-01 DIAGNOSIS — D631 Anemia in chronic kidney disease: Secondary | ICD-10-CM | POA: Diagnosis not present

## 2018-04-01 DIAGNOSIS — E1129 Type 2 diabetes mellitus with other diabetic kidney complication: Secondary | ICD-10-CM | POA: Diagnosis not present

## 2018-04-01 DIAGNOSIS — D509 Iron deficiency anemia, unspecified: Secondary | ICD-10-CM | POA: Diagnosis not present

## 2018-04-02 DIAGNOSIS — J449 Chronic obstructive pulmonary disease, unspecified: Secondary | ICD-10-CM | POA: Diagnosis not present

## 2018-04-02 DIAGNOSIS — Z794 Long term (current) use of insulin: Secondary | ICD-10-CM | POA: Diagnosis not present

## 2018-04-02 DIAGNOSIS — I4891 Unspecified atrial fibrillation: Secondary | ICD-10-CM | POA: Diagnosis not present

## 2018-04-02 DIAGNOSIS — M109 Gout, unspecified: Secondary | ICD-10-CM | POA: Diagnosis not present

## 2018-04-02 DIAGNOSIS — E1122 Type 2 diabetes mellitus with diabetic chronic kidney disease: Secondary | ICD-10-CM | POA: Diagnosis not present

## 2018-04-02 DIAGNOSIS — M329 Systemic lupus erythematosus, unspecified: Secondary | ICD-10-CM | POA: Diagnosis not present

## 2018-04-02 DIAGNOSIS — H409 Unspecified glaucoma: Secondary | ICD-10-CM | POA: Diagnosis not present

## 2018-04-02 DIAGNOSIS — E785 Hyperlipidemia, unspecified: Secondary | ICD-10-CM | POA: Diagnosis not present

## 2018-04-02 DIAGNOSIS — I872 Venous insufficiency (chronic) (peripheral): Secondary | ICD-10-CM | POA: Diagnosis not present

## 2018-04-02 DIAGNOSIS — Z87891 Personal history of nicotine dependence: Secondary | ICD-10-CM | POA: Diagnosis not present

## 2018-04-02 DIAGNOSIS — M150 Primary generalized (osteo)arthritis: Secondary | ICD-10-CM | POA: Diagnosis not present

## 2018-04-02 DIAGNOSIS — Z992 Dependence on renal dialysis: Secondary | ICD-10-CM | POA: Diagnosis not present

## 2018-04-02 DIAGNOSIS — N186 End stage renal disease: Secondary | ICD-10-CM | POA: Diagnosis not present

## 2018-04-02 NOTE — Progress Notes (Signed)
[name] ([id number]) Visit Report for 03/31/2018 Arrival Information Details Patient Name: [name], [name]. Date of Service: 03/31/2018 1:30 PM Medical Record Number: [id number] Patient Account Number: [id number] Date of Birth/Sex: [birth year] (72 y.o. F) Treating RN: Ahmed Prima Primary Care Conchita Truxillo: Sharilyn Sites Other Clinician: Referring Chord Takahashi: Sharilyn Sites Treating Lealer Marsland/Extender: Tito Dine in Treatment: 3 Visit Information History Since Last Visit All ordered tests and consults were completed: No Patient Arrived: Wheel Chair Added or deleted any medications: No Arrival Time: 13:24 Any new allergies or adverse reactions: No Accompanied By: husband Had a fall or experienced change in No Transfer Assistance: EasyPivot Patient activities of daily living that may affect Lift risk of falls: Patient Identification Verified: Yes Signs or symptoms of abuse/neglect since last visito No Secondary Verification Process Yes Hospitalized since last visit: No Completed: Implantable device outside of the clinic excluding No Patient Requires Transmission-Based No cellular tissue based products placed in the center Precautions: since last visit: Patient Has Alerts: Yes Has Dressing in Place as Prescribed: Yes Patient Alerts: Patient on Blood Pain Present Now: Yes Thinner Eliquis DM II Electronic Signature(s) Signed: 03/31/2018 3:49:29 PM By: Alric Quan Entered By: Alric Quan on 03/31/2018 13:24:47 [name] ([id number]) -------------------------------------------------------------------------------- Encounter Discharge Information Details Patient Name: [name]. Date of Service: 03/31/2018 1:30 PM Medical Record Number: [id number] Patient Account Number: [id number] Date of Birth/Sex: 02/07/1946 (72 y.o. F) Treating RN: Montey Hora Primary Care Izyan Ezzell: Sharilyn Sites Other Clinician: Referring Aritza Brunet:  Sharilyn Sites Treating Yochanan Eddleman/Extender: Tito Dine in Treatment: 3 Encounter Discharge Information Items Discharge Condition: Stable Ambulatory Status: Wheelchair Discharge Destination: Home Transportation: Private Auto Accompanied By: spouse Schedule Follow-up Appointment: Yes Clinical Summary of Care: Electronic Signature(s) Signed: 03/31/2018 3:25:11 PM By: Montey Hora Entered By: Montey Hora on 03/31/2018 15:25:10 [name] ([id number]) -------------------------------------------------------------------------------- Lower Extremity Assessment Details Patient Name: [name]. Date of Service: 03/31/2018 1:30 PM Medical Record Number: [id number] Patient Account Number: [id number] Date of Birth/Sex: 08-Dec-1945 (72 y.o. F) Treating RN: Ahmed Prima Primary Care Devone Bonilla: Sharilyn Sites Other Clinician: Referring Rithika Seel: Sharilyn Sites Treating Fallon Haecker/Extender: Tito Dine in Treatment: 3 Edema Assessment Assessed: [Left: No] [Right: No] [Left: Edema] [Right: :] Calf Left: Right: Point of Measurement: 36 cm From Medial Instep cm 30.3 cm Ankle Left: Right: Point of Measurement: 12 cm From Medial Instep cm 20.5 cm Vascular Assessment Pulses: Dorsalis Pedis Palpable: [Right:Yes] Posterior Tibial Extremity colors, hair growth, and conditions: Extremity Color: [Right:Hyperpigmented] Hair Growth on Extremity: [Right:No] Temperature of Extremity: [Right:Warm] Capillary Refill: [Right:> 3 seconds] Toe Nail Assessment Left: Right: Thick: Yes Discolored: Yes Deformed: Yes Improper Length and Hygiene: Yes Electronic Signature(s) Signed: 03/31/2018 3:49:29 PM By: Alric Quan Entered By: Alric Quan on 03/31/2018 13:49:50 [name] ([id number]) -------------------------------------------------------------------------------- Multi Wound Chart Details Patient Name: [name]. Date of Service:  03/31/2018 1:30 PM Medical Record Number: [id number] Patient Account Number: [id number] Date of Birth/Sex: 1946/01/23 (72 y.o. F) Treating RN: Cornell Barman Primary Care Tashiya Souders: Sharilyn Sites Other Clinician: Referring Marko Skalski: Sharilyn Sites Treating Rebekah Zackery/Extender: Tito Dine in Treatment: 3 Vital Signs Height(in): 65 Pulse(bpm): 23 Weight(lbs): 164 Blood Pressure(mmHg): 106/36 Body Mass Index(BMI): 27 Temperature(F): 97.7 Respiratory Rate 18 (breaths/min): Photos: [1:No Photos] [2:No Photos] [3:No Photos] Wound Location: [1:Right Lower Leg - Anterior] [2:Right Lower Leg - Posterior] [3:Right Calcaneus - Lateral] Wounding Event: [1:Gradually Appeared] [2:Gradually Appeared] [3:Gradually Appeared] Primary Etiology: [1:Calciphylaxis] [2:Calciphylaxis] [3:Calciphylaxis] Secondary Etiology: [1:Diabetic Wound/Ulcer of the Lower Extremity] [2:Diabetic Wound/Ulcer  of the Lower Extremity] [3:Diabetic Wound/Ulcer of the Lower Extremity] Comorbid History: [1:Cataracts, Anemia, Chronic Obstructive Pulmonary Disease (COPD), Arrhythmia, Hypertension, Type II Diabetes, End Stage Renal Disease, Gout, Rheumatoid Arthritis] [2:Cataracts, Anemia, Chronic Obstructive Pulmonary Disease (COPD),  Arrhythmia, Hypertension, Type II Diabetes, End Stage Renal Disease, Gout, Rheumatoid Arthritis] [3:Cataracts, Anemia, Chronic Obstructive Pulmonary Disease (COPD), Arrhythmia, Hypertension, Type II Diabetes, End Stage Renal Disease, Gout, Rheumatoid  Arthritis] Date Acquired: [1:06/29/2017] [2:06/29/2017] [3:06/29/2017] Weeks of Treatment: [1:3] [2:3] [3:3] Wound Status: [1:Open] [2:Open] [3:Open] Measurements L x W x D [1:11.4x4.5x0.2] [2:12.5x9.5x0.2] [3:1.1x1.3x0.1] (cm) Area (cm) : [1:40.291] [2:93.266] [3:1.123] Volume (cm) : [1:8.058] [2:18.653] [3:0.112] % Reduction in Area: [1:-16.20%] [2:18.70%] [3:-30.00%] % Reduction in Volume: [1:-16.20%] [2:45.80%] [3:-30.20%] Classification:  [1:Full Thickness Without Exposed Support Structures] [2:Full Thickness With Exposed Support Structures] [3:Full Thickness Without Exposed Support Structures] Exudate Amount: [1:Large] [2:Large] [3:Medium] Exudate Type: [1:Purulent] [2:Purulent] [3:Purulent] Exudate Color: [1:yellow, brown, green] [2:yellow, brown, green] [3:yellow, brown, green] Foul Odor After Cleansing: [1:Yes] [2:Yes] [3:No] Odor Anticipated Due to [1:No] [2:No] [3:N/A] Product Use: Wound Margin: [1:Distinct, outline attached] [2:Distinct, outline attached] [3:Flat and Intact] Granulation Amount: [1:None Present (0%)] [2:Medium (34-66%)] [3:None Present (0%)] Granulation Quality: [1:N/A] [2:Red] [3:N/A] Necrotic Amount: [1:Large (67-100%)] [2:Medium (34-66%)] [3:Large (67-100%)] Necrotic Tissue: [1:Eschar] [2:Eschar, Adherent Slough] [3:Eschar] Exposed Structures: [1:Fascia: No Fat Layer (Subcutaneous] [3:N/A] Tissue) Exposed: No Fat Layer (Subcutaneous Tendon: No Tissue) Exposed: Yes Muscle: No Tendon: Yes Joint: No Bone: No Epithelialization: None None None Debridement: N/A Debridement - Excisional N/A Pre-procedure N/A 14:07 N/A Verification/Time Out Taken: Pain Control: N/A Other N/A Tissue Debrided: N/A Necrotic/Eschar, N/A Subcutaneous Level: N/A Skin/Subcutaneous Tissue N/A Debridement Area (sq cm): N/A 30 N/A Instrument: N/A Blade, Forceps N/A Bleeding: N/A Moderate N/A Hemostasis Achieved: N/A Pressure N/A Debridement Treatment N/A Procedure was tolerated well N/A Response: Post Debridement N/A 12.5x9.5x0.3 N/A Measurements L x W x D (cm) Post Debridement Volume: N/A 27.98 N/A (cm) Periwound Skin Texture: No Abnormalities Noted No Abnormalities Noted No Abnormalities Noted Periwound Skin Moisture: Maceration: Yes No Abnormalities Noted No Abnormalities Noted Periwound Skin Color: Erythema: Yes Erythema: Yes Erythema: Yes Erythema Location: Circumferential Circumferential  Circumferential Temperature: No Abnormality No Abnormality No Abnormality Tenderness on Palpation: Yes Yes Yes Wound Preparation: Ulcer Cleansing: Ulcer Cleansing: Ulcer Cleansing: Rinsed/Irrigated with Saline, Rinsed/Irrigated with Saline, Rinsed/Irrigated with Saline, Other: soap and water Other: soap and water Other: soap and water Topical Anesthetic Applied: Topical Anesthetic Applied: Topical Anesthetic Applied: Other: lidocaine 4% Other: lidocaine 4% Other: lidocaine 4% Procedures Performed: N/A Debridement N/A Treatment Notes Electronic Signature(s) Signed: 04/02/2018 8:34:07 AM By: Linton Ham MD Entered By: Linton Ham on 03/31/2018 14:42:51 [name] ([id number]) -------------------------------------------------------------------------------- [place] Details Patient Name: DEON, IVEY. Date of Service: 03/31/2018 1:30 PM Medical Record Number: [id number] Patient Account Number: [id number] Date of Birth/Sex: 06-23-1946 (72 y.o. F) Treating RN: Cornell Barman Primary Care Li Bobo: Sharilyn Sites Other Clinician: Referring Gayland Nicol: Sharilyn Sites Treating Darianne Muralles/Extender: Tito Dine in Treatment: 3 Active Inactive ` Necrotic Tissue Nursing Diagnoses: Impaired tissue integrity related to necrotic/devitalized tissue Goals: Necrotic/devitalized tissue will be minimized in the wound bed Date Initiated: 03/10/2018 Target Resolution Date: 05/10/2018 Goal Status: Active Patient/caregiver will verbalize understanding of reason and process for debridement of necrotic tissue Date Initiated: 03/10/2018 Target Resolution Date: 03/25/2018 Goal Status: Active Interventions: Assess patient pain level pre-, during and post procedure and prior to discharge Treatment Activities: Apply topical anesthetic as ordered : 03/10/2018 Notes: ` Orientation to the Wound Care Program Nursing  Diagnoses: Knowledge deficit related to the  wound healing center program Goals: Patient/caregiver will verbalize understanding of the [facility] Date Initiated: 03/10/2018 Target Resolution Date: 04/02/2018 Goal Status: Active Interventions: Provide education on orientation to the wound center Notes: ` Soft Tissue Infection Nursing Diagnoses: Impaired tissue integrity Potential for infection: soft tissue EVADEAN, SPROULE ([id number]) Goals: Patient will remain free of wound infection Date Initiated: 03/10/2018 Target Resolution Date: 04/02/2018 Goal Status: Active Interventions: Assess signs and symptoms of infection every visit Notes: ` Wound/Skin Impairment Nursing Diagnoses: Impaired tissue integrity Goals: Ulcer/skin breakdown will have a volume reduction of 80% by week 12 Date Initiated: 03/10/2018 Target Resolution Date: 06/10/2018 Goal Status: Active Interventions: Assess ulceration(s) every visit Treatment Activities: Skin care regimen initiated : 03/10/2018 Topical wound management initiated : 03/10/2018 Notes: Electronic Signature(s) Signed: 03/31/2018 4:43:24 PM By: Gretta Cool, BSN, RN, CWS, Kim RN, BSN Entered By: Gretta Cool, BSN, RN, CWS, Kim on 03/31/2018 14:03:01 [name] ([id number]) -------------------------------------------------------------------------------- Pain Assessment Details Patient Name: [name]. Date of Service: 03/31/2018 1:30 PM Medical Record Number: [id number] Patient Account Number: [id number] Date of Birth/Sex: 08-31-1946 (72 y.o. F) Treating RN: Ahmed Prima Primary Care Cane Dubray: Sharilyn Sites Other Clinician: Referring Lilas Diefendorf: Sharilyn Sites Treating Keyontay Stolz/Extender: Tito Dine in Treatment: 3 Active Problems Location of Pain Severity and Description of Pain Patient Has Paino Yes Site Locations Pain Location: Pain in Ulcers Rate the pain. Current Pain Level: 8 Character of Pain Describe the Pain: Aching Pain Management  and Medication Current Pain Management: Notes Topical or injectable lidocaine is offered to patient for acute pain when surgical debridement is performed. If needed, Patient is instructed to use over the counter pain medication for the following 24-48 hours after debridement. Wound care MDs do not prescribed pain medications. Patient has chronic pain or uncontrolled pain. Patient has been instructed to make an appointment with their Primary Care Physician for pain management. Electronic Signature(s) Signed: 03/31/2018 3:49:29 PM By: Alric Quan Entered By: Alric Quan on 03/31/2018 13:25:09 [name] ([id number]) -------------------------------------------------------------------------------- Patient/Caregiver Education Details Patient Name: [name]. Date of Service: 03/31/2018 1:30 PM Medical Record Number: [id number] Patient Account Number: [id number] Date of Birth/Gender: [birth year] (72 y.o. F) Treating RN: Montey Hora Primary Care Physician: Sharilyn Sites Other Clinician: Referring Physician: Sharilyn Sites Treating Physician/Extender: Tito Dine in Treatment: 3 Education Assessment Education Provided To: Patient Education Topics Provided Welcome To [facility]: Handouts: Other: stretch out your knee to avoid contracture Methods: Explain/Verbal Responses: State content correctly Electronic Signature(s) Signed: 03/31/2018 3:51:22 PM By: Montey Hora Entered By: Montey Hora on 03/31/2018 15:26:31 [name] ([id number]) -------------------------------------------------------------------------------- Wound Assessment Details Patient Name: [name]. Date of Service: 03/31/2018 1:30 PM Medical Record Number: [id number] Patient Account Number: [id number] Date of Birth/Sex: [birth year] (72 y.o. F) Treating RN: Ahmed Prima Primary Care Marvie Brevik: Sharilyn Sites Other Clinician: Referring Harley Fitzwater:  Sharilyn Sites Treating Bryam Taborda/Extender: Tito Dine in Treatment: 3 Wound Status Wound Number: 1 Primary Calciphylaxis Etiology: Wound Location: Right Lower Leg - Anterior Secondary Diabetic Wound/Ulcer of the Lower Extremity Wounding Event: Gradually Appeared Etiology: Date Acquired: 06/29/2017 Wound Open Weeks Of Treatment: 3 Status: Clustered Wound: No Comorbid Cataracts, Anemia, Chronic Obstructive History: Pulmonary Disease (COPD), Arrhythmia, Hypertension, Type II Diabetes, End Stage Renal Disease, Gout, Rheumatoid Arthritis Photos Photo Uploaded By: Alric Quan on 03/31/2018 15:24:03 Wound Measurements Length: (cm) 11.4 Width: (cm) 4.5 Depth: (cm) 0.2 Area: (cm) 40.291 Volume: (cm) 8.058 % Reduction in  Area: -16.2% % Reduction in Volume: -16.2% Epithelialization: None Tunneling: No Undermining: No Wound Description Full Thickness Without Exposed Support Classification: Structures Wound Margin: Distinct, outline attached Exudate Large Amount: Exudate Type: Purulent Exudate Color: yellow, brown, green Foul Odor After Cleansing: Yes Due to Product Use: No Slough/Fibrino Yes Wound Bed Granulation Amount: None Present (0%) Exposed Structure Necrotic Amount: Large (67-100%) Fascia Exposed: No Necrotic Quality: Eschar Fat Layer (Subcutaneous Tissue) Exposed: No Tendon Exposed: No Cloria, Ciresi Alexandre F. ([id number]) Muscle Exposed: No Joint Exposed: No Bone Exposed: No Periwound Skin Texture Texture Color No Abnormalities Noted: No No Abnormalities Noted: No Erythema: Yes Moisture Erythema Location: Circumferential No Abnormalities Noted: No Maceration: Yes Temperature / Pain Temperature: No Abnormality Tenderness on Palpation: Yes Wound Preparation Ulcer Cleansing: Rinsed/Irrigated with Saline, Other: soap and water, Topical Anesthetic Applied: Other: lidocaine 4%, Treatment Notes Wound #1 (Right, Anterior Lower Leg) 1.  Cleansed with: Cleanse wound with antibacterial soap and water 2. Anesthetic Topical Lidocaine 4% cream to wound bed prior to debridement 4. Dressing Applied: Calcium Alginate with Silver Santyl Ointment Other dressing (specify in notes) 5. Secondary Dressing Applied ABD Pad 7. Secured with Tape Other (specify in notes) Notes kerlix/coban wrap Electronic Signature(s) Signed: 03/31/2018 3:49:29 PM By: Alric Quan Entered By: Alric Quan on 03/31/2018 13:50:05 [name] ([id number]) -------------------------------------------------------------------------------- Wound Assessment Details Patient Name: [name]. Date of Service: 03/31/2018 1:30 PM Medical Record Number: [id number] Patient Account Number: [id number] Date of Birth/Sex: 10/05/1945 (72 y.o. F) Treating RN: Ahmed Prima Primary Care Naiya Corral: Sharilyn Sites Other Clinician: Referring Analiz Tvedt: Sharilyn Sites Treating Belvia Gotschall/Extender: Tito Dine in Treatment: 3 Wound Status Wound Number: 2 Primary Calciphylaxis Etiology: Wound Location: Right Lower Leg - Posterior Secondary Diabetic Wound/Ulcer of the Lower Extremity Wounding Event: Gradually Appeared Etiology: Date Acquired: 06/29/2017 Wound Open Weeks Of Treatment: 3 Status: Clustered Wound: No Comorbid Cataracts, Anemia, Chronic Obstructive History: Pulmonary Disease (COPD), Arrhythmia, Hypertension, Type II Diabetes, End Stage Renal Disease, Gout, Rheumatoid Arthritis Photos Photo Uploaded By: Alric Quan on 03/31/2018 15:24:04 Wound Measurements Length: (cm) 12.5 Width: (cm) 9.5 Depth: (cm) 0.2 Area: (cm) 93.266 Volume: (cm) 18.653 % Reduction in Area: 18.7% % Reduction in Volume: 45.8% Epithelialization: None Tunneling: No Undermining: No Wound Description Full Thickness With Exposed Support Classification: Structures Wound Margin: Distinct, outline attached Exudate Large Amount: Exudate  Type: Purulent Exudate Color: yellow, brown, green Foul Odor After Cleansing: Yes Due to Product Use: No Slough/Fibrino Yes Wound Bed Granulation Amount: Medium (34-66%) Exposed Structure Granulation Quality: Red Fat Layer (Subcutaneous Tissue) Exposed: Yes Necrotic Amount: Medium (34-66%) Tendon Exposed: Yes Necrotic Quality: Eschar, Adherent [address] Emmarae F. ([id number]) Periwound Skin Texture Texture Color No Abnormalities Noted: No No Abnormalities Noted: No Erythema: Yes Moisture Erythema Location: Circumferential No Abnormalities Noted: No Temperature / Pain Temperature: No Abnormality Tenderness on Palpation: Yes Wound Preparation Ulcer Cleansing: Rinsed/Irrigated with Saline, Other: soap and water, Topical Anesthetic Applied: Other: lidocaine 4%, Treatment Notes Wound #2 (Right, Posterior Lower Leg) 1. Cleansed with: Cleanse wound with antibacterial soap and water 2. Anesthetic Topical Lidocaine 4% cream to wound bed prior to debridement 4. Dressing Applied: Calcium Alginate with Silver Santyl Ointment Other dressing (specify in notes) 5. Secondary Dressing Applied ABD Pad 7. Secured with Tape Other (specify in notes) Notes kerlix/coban wrap Electronic Signature(s) Signed: 03/31/2018 3:49:29 PM By: Alric Quan Signed: 03/31/2018 4:43:24 PM By: Gretta Cool, BSN, RN, CWS, Kim RN, BSN Entered By: Gretta Cool, BSN, RN, CWS, Kim on 03/31/2018 14:06:51 [name] ([id number]) --------------------------------------------------------------------------------  Wound Assessment Details Patient Name: SHAMAINE, MULKERN. Date of Service: 03/31/2018 1:30 PM Medical Record Number: [id number] Patient Account Number: [id number] Date of Birth/Sex: December 05, 1945 (72 y.o. F) Treating RN: Ahmed Prima Primary Care Montoya Watkin: Sharilyn Sites Other Clinician: Referring Ceferino Lang: Sharilyn Sites Treating Lalia Loudon/Extender: Tito Dine in Treatment: 3 Wound  Status Wound Number: 3 Primary Calciphylaxis Etiology: Wound Location: Right Calcaneus - Lateral Secondary Diabetic Wound/Ulcer of the Lower Extremity Wounding Event: Gradually Appeared Etiology: Date Acquired: 06/29/2017 Wound Open Weeks Of Treatment: 3 Status: Clustered Wound: No Comorbid Cataracts, Anemia, Chronic Obstructive History: Pulmonary Disease (COPD), Arrhythmia, Hypertension, Type II Diabetes, End Stage Renal Disease, Gout, Rheumatoid Arthritis Photos Photo Uploaded By: Alric Quan on 03/31/2018 15:40:01 Wound Measurements Length: (cm) 1.1 Width: (cm) 1.3 Depth: (cm) 0.1 Area: (cm) 1.123 Volume: (cm) 0.112 % Reduction in Area: -30% % Reduction in Volume: -30.2% Epithelialization: None Tunneling: No Undermining: No Wound Description Full Thickness Without Exposed Support Classification: Structures Wound Margin: Flat and Intact Exudate Medium Amount: Exudate Type: Purulent Exudate Color: yellow, brown, green Foul Odor After Cleansing: No Slough/Fibrino Yes Wound Bed Granulation Amount: None Present (0%) Necrotic Amount: Large (67-100%) Necrotic Quality: Eschar Periwound Skin Texture Taleisha, Kaczynski Gratia F. ([id number]) Texture Color No Abnormalities Noted: No No Abnormalities Noted: No Erythema: Yes Moisture Erythema Location: Circumferential No Abnormalities Noted: No Temperature / Pain Temperature: No Abnormality Tenderness on Palpation: Yes Wound Preparation Ulcer Cleansing: Rinsed/Irrigated with Saline, Other: soap and water, Topical Anesthetic Applied: Other: lidocaine 4%, Treatment Notes Wound #3 (Right, Lateral Calcaneus) 1. Cleansed with: Cleanse wound with antibacterial soap and water 2. Anesthetic Topical Lidocaine 4% cream to wound bed prior to debridement 4. Dressing Applied: Calcium Alginate with Silver Santyl Ointment Other dressing (specify in notes) 5. Secondary Dressing Applied ABD Pad 7. Secured  with Tape Other (specify in notes) Notes kerlix/coban wrap Electronic Signature(s) Signed: 03/31/2018 3:49:29 PM By: Alric Quan Entered By: Alric Quan on 03/31/2018 13:50:26 [name] ([id number]) -------------------------------------------------------------------------------- Vitals Details Patient Name: [name]. Date of Service: 03/31/2018 1:30 PM Medical Record Number: [id number] Patient Account Number: [id number] Date of Birth/Sex: Jan 21, 1946 (72 y.o. F) Treating RN: Ahmed Prima Primary Care Anjelique Makar: Sharilyn Sites Other Clinician: Referring Zeba Luby: Sharilyn Sites Treating Faria Casella/Extender: Tito Dine in Treatment: 3 Vital Signs Time Taken: 13:25 Temperature (F): 97.7 Height (in): 65 Pulse (bpm): 77 Weight (lbs): 164 Respiratory Rate (breaths/min): 18 Body Mass Index (BMI): 27.3 Blood Pressure (mmHg): 106/36 Reference Range: 80 - 120 mg / dl Notes Retook BP manually and it was 110/38. Made Dr. Dellia Nims aware of BP. Electronic Signature(s) Signed: 03/31/2018 3:49:29 PM By: Alric Quan Entered By: Alric Quan on 03/31/2018 13:30:04

## 2018-04-02 NOTE — Progress Notes (Signed)
KEYONA, EMRICH ([id number]) Visit Report for 03/31/2018 Debridement Details Patient Name: [name], [name]. Date of Service: 03/31/2018 1:30 PM Medical Record Number: [id number] Patient Account Number: [id number] Date of Birth/Sex: October 17, 1945 (72 y.o. F) Treating RN: Cornell Barman Primary Care Provider: Sharilyn Sites Other Clinician: Referring Provider: Sharilyn Sites Treating Provider/Extender: Tito Dine in Treatment: 3 Debridement Performed for Wound #2 Right,Posterior Lower Leg Assessment: Performed By: Physician Ricard Dillon, MD Debridement Type: Debridement Severity of Tissue Pre Fat layer exposed Debridement: Pre-procedure Verification/Time Yes - 14:07 Out Taken: Start Time: 14:07 Pain Control: Other : lidocaine 4% Total Area Debrided (L x W): 6 (cm) x 5 (cm) = 30 (cm) Tissue and other material Viable, Non-Viable, Eschar, Subcutaneous, Skin: Dermis debrided: Level: Skin/Subcutaneous Tissue Debridement Description: Excisional Instrument: Blade, Forceps Bleeding: Moderate Hemostasis Achieved: Pressure Response to Treatment: Procedure was tolerated well Level of Consciousness: Awake and Alert Post Debridement Measurements of Total Wound Length: (cm) 12.5 Width: (cm) 9.5 Depth: (cm) 0.3 Volume: (cm) 27.98 Character of Wound/Ulcer Post Debridement: Stable Severity of Tissue Post Debridement: Fat layer exposed Post Procedure Diagnosis Same as Pre-procedure Electronic Signature(s) Signed: 03/31/2018 4:43:24 PM By: Gretta Cool, BSN, RN, CWS, Kim RN, BSN Signed: 04/02/2018 8:34:07 AM By: Linton Ham MD Entered By: Linton Ham on 03/31/2018 14:43:15 [name] ([id number]) -------------------------------------------------------------------------------- HPI Details Patient Name: [name]. Date of Service: 03/31/2018 1:30 PM Medical Record Number: [id number] Patient Account Number: [id number] Date of Birth/Sex: Nov 15, 1945 (72  y.o. F) Treating RN: Cornell Barman Primary Care Provider: Sharilyn Sites Other Clinician: Referring Provider: Sharilyn Sites Treating Provider/Extender: Tito Dine in Treatment: 3 History of Present Illness HPI Description: ADMISSION 03/10/18; This is a 72 year old woman with a history of end-stage renal disease on hemodialysis, type 2 diabetes on insulin with excellent control, systemic lupus. Her problem began in October 2018 she developed a blister in an open wound on the right anterior tibial mid tibial area. This progressed and worsened until the tissue loss was substantial on the anterior and posterior right calf. she was followed in [place] by physical therapy at one point although I have not reviewed their notes. She apparently did not receive pulse lavage to the wound. I note that she was hospitalized from 03/01/18 through 03/03/18 predominantly for debridement of the wound by general surgery. She saw Dr. Arnoldo Morale who did not believe there was a role for surgical debridement. Was felt that definitive treatment would be amputation but the patient already been offered this by vein and vascular in [facility] and she refused arrangements were made for her to be sent to this clinic for review. The patient is already had a reasonably substantial review for this problem. she saw vein and vascular extensively over the course of this year.her noninvasive arterial studies done in December 2018 suggested a right superficial femoral artery occlusion/stenosis. She did not have any issues on the left. At that point her ABI on the right was 0.8 with monophasic waveforms 1.06 on the left. However she had a velocity drop off in the superficial femoral artery. She went on to have diagnostic angiogram on 12/18/17 by Dr. Oneida Alar. This showed on the right that the common femoral, profunda femoris and superficial femoral arteries were all patent. There was a focal less than 1 mm length stenosis on the  right superficial femoral artery otherwise the artery was widely patent. The right popliteal artery was widely patent. There was 3 vessel runoff via the anterior, tibial and paranoid Milta Deiters arteries to  the right foot. She was not felt to have a vascular cause of these wounds. sHe's also had 2 separate biopsies. Firstly on 3/25 showed nonspecific findings with calciphylaxis not entirely excluded. No definite calcium deposits were seen within the dermis within the sections reviewed. Vascular features were nonspecific. Furthermore pyogenic granuloma may demonstrate similar findings. 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 Calciphylaxis was not favored in this specimen. There was a calcified vessel. I don't see a pathology specimen that specifically says definitive calciphylaxis. The patient had an x-ray of her right tibia while she is in hospital there was no osteomyelitis. She had an MRI of her foot in March that did not show osteomyelitis as well The patient has been using Betadine and wraps to the wounds. This is being done by a home health where they live in [place] 03/17/18; patient admitted to clinic last week. 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 Considerable amount of necrotic surface material that I removed from the posterior calf last week and again this week. The area anteriorly is far too adherent at this point. She lives in [place]. She has advanced home care home health. Dialyzes in [place] as well. 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 There is very apparent necrotic material in the anterior side. We've been using Santyl anteriorly and silver alginate posteriorly. 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 I've gotten her an  appointment at [facility] dermatology next week hopefully to have the original biopsy sites reviewed. The patient is still in a lot of pain. She is not straightening the leg out. She had an angiogram that did not suggest coexistent macrovascular disease Electronic Signature(s) Signed: 04/02/2018 8:34:07 AM By: Linton Ham MD [name], [name] ([id number]) Entered By: Linton Ham on 03/31/2018 15:03:59 [name] ([id number]) -------------------------------------------------------------------------------- Physical Exam Details Patient Name: [name], [name]. Date of Service: 03/31/2018 1:30 PM Medical Record Number: [id number] Patient Account Number: [id number] Date of Birth/Sex: 1946/06/02 (72 y.o. F) Treating RN: Cornell Barman Primary Care Provider: Sharilyn Sites Other Clinician: Referring Provider: Sharilyn Sites Treating Provider/Extender: Tito Dine in Treatment: 3 Notes wound exam oSubstantial tissue loss on the right posterior calf also on the right anterior. oUsing a #10 scalpel and pickups I remove necrotic debris from the posterior area. She tolerates this reasonably well. The tissue appears better than on the anterior side. oAnteriorly tightly adherent necrotic debris. No debridement Electronic Signature(s) Signed: 04/02/2018 8:34:07 AM By: Linton Ham MD Entered By: Linton Ham on 03/31/2018 15:05:34 [name] ([id number]) -------------------------------------------------------------------------------- Physician Orders Details Patient Name: [name]. Date of Service: 03/31/2018 1:30 PM Medical Record Number: [id number] Patient Account Number: [id number] Date of Birth/Sex: 04-22-1946 (72 y.o. F) Treating RN: Cornell Barman Primary Care Provider: Sharilyn Sites Other Clinician: Referring Provider: Sharilyn Sites Treating Provider/Extender: Tito Dine in Treatment: 3 Verbal / Phone Orders: No Diagnosis Coding Wound  Cleansing Wound #1 Right,Anterior Lower Leg o Clean wound with Normal Saline. Wound #2 Right,Posterior Lower Leg o Clean wound with Normal Saline. Wound #3 Right,Lateral Calcaneus o Clean wound with Normal Saline. Anesthetic (add to Medication List) Wound #1 Right,Anterior Lower Leg o Topical Lidocaine 4% cream applied to wound bed prior to debridement (In Clinic Only). Wound #2 Right,Posterior Lower Leg o Topical Lidocaine 4% cream applied to wound bed  prior to debridement (In Clinic Only). Wound #3 Right,Lateral Calcaneus o Topical Lidocaine 4% cream applied to wound bed prior to debridement (In Clinic Only). Primary Wound Dressing Wound #1 Right,Anterior Lower Leg o Santyl Ointment - black areas only Wound #2 Right,Posterior Lower Leg o Santyl Ointment - black areas only o Silver Alginate - reddened areas Wound #3 Right,Lateral Calcaneus o Santyl Ointment - black areas only o Silver Alginate - reddened areas Secondary Dressing Wound #1 Right,Anterior Lower Leg o Other - Keramax or equal Wound #2 Right,Posterior Lower Leg o Other - Keramax or equal Wound #3 Right,Lateral Calcaneus o Other - Keramax or equal Dressing Change Frequency Wound #1 Right,Anterior Lower Leg DELITA, CHIQUITO ([id number]) o Change Dressing Monday, Wednesday, Friday Wound #2 Right,Posterior Lower Leg o Change Dressing Monday, Wednesday, Friday Wound #3 Right,Lateral Calcaneus o Change Dressing Monday, Wednesday, Friday Follow-up Appointments Wound #1 Right,Anterior Lower Leg o Return Appointment in 1 week. Wound #2 Right,Posterior Lower Leg o Return Appointment in 1 week. Wound #3 Right,Lateral Calcaneus o Return Appointment in 1 week. Edema Control Wound #1 Right,Anterior Lower Leg o Kerlix and Coban - Right Lower Extremity o Elevate legs to the level of the heart and pump ankles as often as possible Wound #2 Right,Posterior Lower Leg o  Kerlix and Coban - Right Lower Extremity o Elevate legs to the level of the heart and pump ankles as often as possible Wound #3 Right,Lateral Calcaneus o Kerlix and Coban - Right Lower Extremity o Elevate legs to the level of the heart and pump ankles as often as possible Home Health Wound #1 Right,Anterior Lower Leg o [place] Visits - [place] Nurse may visit PRN to address patientos wound care needs. o FACE TO FACE ENCOUNTER: MEDICARE and MEDICAID PATIENTS: I certify that this patient is under my care and that I had a face-to-face encounter that meets the physician face-to-face encounter requirements with this patient on this date. The encounter with the patient was in whole or in part for the following MEDICAL CONDITION: (primary reason for [place]) MEDICAL NECESSITY: I certify, that based on my findings, NURSING services are a medically necessary home health service. HOME BOUND STATUS: I certify that my clinical findings support that this patient is homebound (i.e., Due to illness or injury, pt requires aid of supportive devices such as crutches, cane, wheelchairs, walkers, the use of special transportation or the assistance of another person to leave their place of residence. There is a normal inability to leave the home and doing so requires considerable and taxing effort. Other absences are for medical reasons / religious services and are infrequent or of short duration when for other reasons). o If current dressing causes regression in wound condition, may D/C ordered dressing product/s and apply Normal Saline Moist Dressing daily until next [place] / Other MD appointment. [place] of regression in wound condition at [phone]. Wound #2 [place] Visits - [place] Nurse may visit PRN to address patientos wound care needs. o FACE TO FACE  ENCOUNTER: MEDICARE and MEDICAID PATIENTS: I certify that this patient is under my care and that I had a face-to-face encounter that meets the physician face-to-face encounter requirements with this patient on this date. The encounter with the patient was in whole or in part for the following MEDICAL CONDITION: (primary reason for [place]) MEDICAL NECESSITY: I certify, that based on my findings, Kazmi, Royanne F. (  [id number]) NURSING services are a medically necessary home health service. HOME BOUND STATUS: I certify that my clinical findings support that this patient is homebound (i.e., Due to illness or injury, pt requires aid of supportive devices such as crutches, cane, wheelchairs, walkers, the use of special transportation or the assistance of another person to leave their place of residence. There is a normal inability to leave the home and doing so requires considerable and taxing effort. Other absences are for medical reasons / religious services and are infrequent or of short duration when for other reasons). o If current dressing causes regression in wound condition, may D/C ordered dressing product/s and apply Normal Saline Moist Dressing daily until next [place] / Other MD appointment. [place] of regression in wound condition at [phone]. Wound #3 Right,Lateral Calcaneus o Continue Home Health Visits - [place] Nurse may visit PRN to address patientos wound care needs. o FACE TO FACE ENCOUNTER: MEDICARE and MEDICAID PATIENTS: I certify that this patient is under my care and that I had a face-to-face encounter that meets the physician face-to-face encounter requirements with this patient on this date. The encounter with the patient was in whole or in part for the following MEDICAL CONDITION: (primary reason for [place]) MEDICAL NECESSITY: I certify, that based on my findings, NURSING services are a  medically necessary home health service. HOME BOUND STATUS: I certify that my clinical findings support that this patient is homebound (i.e., Due to illness or injury, pt requires aid of supportive devices such as crutches, cane, wheelchairs, walkers, the use of special transportation or the assistance of another person to leave their place of residence. There is a normal inability to leave the home and doing so requires considerable and taxing effort. Other absences are for medical reasons / religious services and are infrequent or of short duration when for other reasons). o If current dressing causes regression in wound condition, may D/C ordered dressing product/s and apply Normal Saline Moist Dressing daily until next [place] / Other MD appointment. [place] of regression in wound condition at [phone]. Medications-please add to medication list. Wound #1 Right,Anterior Lower Leg o Santyl Enzymatic Ointment Wound #2 Right,Posterior Lower Leg o Santyl Enzymatic Ointment Wound #3 Right,Lateral Calcaneus o Santyl Enzymatic Ointment Electronic Signature(s) Signed: 03/31/2018 4:43:24 PM By: Gretta Cool, BSN, RN, CWS, Kim RN, BSN Signed: 04/02/2018 8:34:07 AM By: Linton Ham MD Entered By: Gretta Cool, BSN, RN, CWS, Kim on 03/31/2018 14:11:11 [name] ([id number]) -------------------------------------------------------------------------------- Problem List Details Patient Name: [name], [name]. Date of Service: 03/31/2018 1:30 PM Medical Record Number: [id number] Patient Account Number: [id number] Date of Birth/Sex: 05-10-1946 (72 y.o. 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 owe continue with change Anterior covered by tightly adherent necrotic debris. Santyl to the anterior wound area which is covered by tightly Medical adherent necrotic Decision debris oBetter Making : looking surface posteriorly I'm going to change to Hydrofera Blue 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 03/10/2018 Yes Yes Status Complications Interventions No presumably this is secondary to calciphylaxis we have arranged change for the patient to see Medical dermatology next Decision  week on Making : [facility] dermatology [phone] Type 2 diabetes mellitus with other skin ulcer 03/10/2018 No Yes L89.610 Pressure ulcer of right heel, unstageable 03/10/2018 No Yes I13.2 Hypertensive heart and chronic Little disease with heart 03/10/2018 No Yes failure and with stage 5 chronic Little disease, or end stage renal disease Inactive Problems DARE, SPILLMAN ([id number]) Resolved Problems Electronic Signature(s) Signed: 04/02/2018 8:34:07 AM By: Linton Ham MD Entered By: Linton Ham on 03/31/2018 14:41:11 [name] ([id number]) -------------------------------------------------------------------------------- Progress Note Details Patient Name: [name]. Date of Service: 03/31/2018 1:30 PM Medical Record Number: [id number] Patient Account Number: [id number] Date of Birth/Sex: [birth year] (72 y.o. F) Treating RN: Cornell Barman Primary Care Provider: Sharilyn Sites Other Clinician: Referring Provider: Sharilyn Sites Treating Provider/Extender: Tito Dine in Treatment: 3 Subjective History of Present Illness (HPI) ADMISSION 03/10/18; This is a 72 year old woman with a history of end-stage renal disease on hemodialysis, type 2 diabetes on insulin with excellent control, systemic  lupus. Her problem began in October 2018 she developed a blister in an open wound on the right anterior tibial mid tibial area. This progressed and worsened until the tissue loss was substantial on the anterior and posterior right calf. she was followed in [place] by physical therapy at one point although I have not reviewed their notes. She apparently did not receive pulse lavage to the wound. I note that she was hospitalized from 03/01/18 through 03/03/18 predominantly for debridement of the wound by general surgery. She saw Dr. Arnoldo Morale who did not believe there was a role for surgical debridement. Was felt that definitive treatment would be amputation but the patient already been offered this by vein and vascular in [facility] and she refused arrangements were made for her to be sent to this clinic for review. The patient is already had a reasonably substantial review for this problem. she saw vein and vascular extensively over the course of this year.her noninvasive arterial studies done in December 2018 suggested a right superficial femoral artery occlusion/stenosis. She did not have any issues on the left. At that point her ABI on the right was 0.8 with monophasic waveforms 1.06 on the left. However she had a velocity drop off in the superficial femoral artery. She went on to have diagnostic angiogram on 12/18/17 by Dr. Oneida Alar. This showed on the right that the common femoral, profunda femoris and superficial femoral arteries were all patent. There was a focal less than 1 mm length stenosis on the right superficial femoral artery otherwise the artery was widely patent. The right popliteal artery was widely patent. There was 3 vessel runoff via the anterior, tibial and paranoid Milta Deiters arteries to the right foot. She was not felt to have a vascular cause of these wounds. sHe's also had 2 separate biopsies. Firstly on 3/25 showed nonspecific findings with calciphylaxis not entirely excluded.  No definite calcium deposits were seen within the dermis within the sections reviewed. Vascular features were nonspecific. Furthermore pyogenic granuloma may demonstrate similar findings. 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 Calciphylaxis was not favored in this specimen. There was a calcified vessel. I don't see a pathology specimen that specifically says definitive calciphylaxis. The patient had an x-ray of her right tibia while she is in hospital there was no osteomyelitis. She had an MRI of her foot in March that did not show osteomyelitis as well The patient has been using Betadine and  wraps to the wounds. This is being done by a home health where they live in [place] 03/17/18; patient admitted to clinic last week. 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 Considerable amount of necrotic surface material that I removed from the posterior calf last week and again this week. The area anteriorly is far too adherent at this point. She lives in [place]. She has advanced home care home health. Dialyzes in [place] as well. 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 There is very apparent necrotic material in the anterior side. We've been using Santyl anteriorly and silver alginate posteriorly. 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 I've gotten her an appointment at [facility] dermatology next week hopefully to have the original biopsy sites reviewed. The patient is still in a lot of pain. She is not straightening the leg out. She had an angiogram that did not suggest coexistent macrovascular disease [name], Dana F. ([id number]) Objective Constitutional Vitals Time Taken: 1:25 PM, Height: 65 in, Weight: 164 lbs, BMI: 27.3, Temperature: 97.7 F,  Pulse: 77 bpm, Respiratory Rate: 18 breaths/min, Blood Pressure: 106/36 mmHg. General Notes: Retook BP manually and it was 110/38. Made Dr. Dellia Nims aware of BP. Integumentary (Hair, Skin) Wound #1 status is Open. Original cause of wound was Gradually Appeared. The wound is located on the Right,Anterior Lower Leg. The wound measures 11.4cm length x 4.5cm width x 0.2cm depth; 40.291cm^2 area and 8.058cm^3 volume. There is no tunneling or undermining noted. There is a large amount of purulent drainage noted. Foul odor after cleansing was noted. The wound margin is distinct with the outline attached to the wound base. There is no granulation within the wound bed. There is a large (67-100%) amount of necrotic tissue within the wound bed including Eschar. The periwound skin appearance exhibited: Maceration, Erythema. The surrounding wound skin color is noted with erythema which is circumferential. Periwound temperature was noted as No Abnormality. The periwound has tenderness on palpation. Wound #2 status is Open. Original cause of wound was Gradually Appeared. The wound is located on the Right,Posterior Lower Leg. The wound measures 12.5cm length x 9.5cm width x 0.2cm depth; 93.266cm^2 area and 18.653cm^3 volume. There is tendon and Fat Layer (Subcutaneous Tissue) Exposed exposed. There is no tunneling or undermining noted. There is a large amount of purulent drainage noted. Foul odor after cleansing was noted. The wound margin is distinct with the outline attached to the wound base. There is medium (34-66%) red granulation within the wound bed. There is a medium (34- 66%) amount of necrotic tissue within the wound bed including Eschar and Adherent Slough. The periwound skin appearance exhibited: Erythema. The surrounding wound skin color is noted with erythema which is circumferential. Periwound temperature was noted as No Abnormality. The periwound has tenderness on palpation. Wound #3 status is Open.  Original cause of wound was Gradually Appeared. The wound is located on the Right,Lateral Calcaneus. The wound measures 1.1cm length x 1.3cm width x 0.1cm depth; 1.123cm^2 area and 0.112cm^3 volume. There is no tunneling or undermining noted. There is a medium amount of purulent drainage noted. The wound margin is flat and intact. There is no granulation within the wound bed. There is a large (67-100%) amount of necrotic tissue within the wound bed including Eschar. The periwound skin appearance exhibited: Erythema. The surrounding wound skin color is noted with erythema which is circumferential. Periwound temperature was noted as No Abnormality. The  periwound has tenderness on palpation. 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 ([id number]) Procedures Wound #2 Pre-procedure diagnosis of Wound #2 is a Calciphylaxis located on the Right,Posterior Lower Leg .Severity of Tissue Pre Debridement is: Fat layer exposed. There was a Excisional Skin/Subcutaneous Tissue Debridement with a total area of 30 sq cm performed by Ricard Dillon, MD. With the following instrument(s): Blade, and Forceps to remove Viable and Non-Viable tissue/material. Material removed includes Eschar, Subcutaneous Tissue, and Skin: Dermis after achieving pain control using Other (lidocaine 4%). A time out was conducted at 14:07, prior to the start of the procedure. A Moderate amount of bleeding was controlled with Pressure. The procedure was tolerated well. Patient s Level of Consciousness post procedure was recorded as Awake and Alert. Post Debridement Measurements: 12.5cm length x  9.5cm width x 0.3cm depth; 27.98cm^3 volume. Character of Wound/Ulcer Post Debridement is stable. Severity of Tissue Post Debridement is: Fat layer exposed. Post procedure Diagnosis Wound #2: Same as Pre-Procedure Plan Wound Cleansing: Wound #1 Right,Anterior Lower Leg: Clean wound with Normal Saline. Wound #2 Right,Posterior Lower Leg: Clean wound with Normal Saline. Wound #3 Right,Lateral Calcaneus: Clean wound with Normal Saline. Anesthetic (add to Medication List): Wound #1 Right,Anterior Lower Leg: Topical Lidocaine 4% cream applied to wound bed prior to debridement (In Clinic Only). Wound #2 Right,Posterior Lower Leg: Topical Lidocaine 4% cream applied to wound bed prior to debridement (In Clinic Only). Wound #3 Right,Lateral Calcaneus: Topical Lidocaine 4% cream applied to wound bed prior to debridement (In Clinic Only). Primary Wound Dressing: Wound #1 Right,Anterior Lower Leg: Santyl Ointment - black areas only Wound #2 Right,Posterior Lower Leg: Santyl Ointment - black areas only Silver Alginate - reddened areas Wound #3 Right,Lateral Calcaneus: Santyl Ointment - black areas only Silver Alginate - reddened areas Secondary Dressing: Wound #1 Right,Anterior Lower Leg: Other - Keramax or equal Wound #2 Right,Posterior Lower Leg: Other - Keramax or equal Wound #3 Right,Lateral Calcaneus: Other - Keramax or equal Dressing Change Frequency: Wound #1 Right,Anterior Lower Leg: Change Dressing Monday, Wednesday, Friday Wound #2 Right,Posterior Lower Leg: [name], [name] ([id number]) Change Dressing Monday, Wednesday, Friday Wound #3 Right,Lateral Calcaneus: Change Dressing Monday, Wednesday, Friday Follow-up Appointments: Wound #1 Right,Anterior Lower Leg: Return Appointment in 1 week. Wound #2 Right,Posterior Lower Leg: Return Appointment in 1 week. Wound #3 Right,Lateral Calcaneus: Return Appointment in 1 week. Edema Control: Wound #1 Right,Anterior Lower  Leg: Kerlix and Coban - Right Lower Extremity Elevate legs to the level of the heart and pump ankles as often as possible Wound #2 Right,Posterior Lower Leg: Kerlix and Coban - Right Lower Extremity Elevate legs to the level of the heart and pump ankles as often as possible Wound #3 Right,Lateral Calcaneus: Kerlix and Coban - Right Lower Extremity Elevate legs to the level of the heart and pump ankles as often as possible Home Health: Wound #1 Right,Anterior Lower Leg: [place] Visits - [place] Nurse may visit PRN to address patient s wound care needs. FACE TO FACE ENCOUNTER: MEDICARE and MEDICAID PATIENTS: I certify that this patient is under my care and that I had a face-to-face encounter that meets the physician face-to-face encounter requirements with this patient on this  date. The encounter with the patient was in whole or in part for the following MEDICAL CONDITION: (primary reason for [place]) MEDICAL NECESSITY: I certify, that based on my findings, NURSING services are a medically necessary home health service. HOME BOUND STATUS: I certify that my clinical findings support that this patient is homebound (i.e., Due to illness or injury, pt requires aid of supportive devices such as crutches, cane, wheelchairs, walkers, the use of special transportation or the assistance of another person to leave their place of residence. There is a normal inability to leave the home and doing so requires considerable and taxing effort. Other absences are for medical reasons / religious services and are infrequent or of short duration when for other reasons). If current dressing causes regression in wound condition, may D/C ordered dressing product/s and apply Normal Saline Moist Dressing daily until next [place] / Other MD appointment. [place] of regression in wound condition at [phone]. Wound #2 Right,Posterior Lower  Leg: [place] Visits - [place] Nurse may visit PRN to address patient s wound care needs. FACE TO FACE ENCOUNTER: MEDICARE and MEDICAID PATIENTS: I certify that this patient is under my care and that I had a face-to-face encounter that meets the physician face-to-face encounter requirements with this patient on this date. The encounter with the patient was in whole or in part for the following MEDICAL CONDITION: (primary reason for [place]) MEDICAL NECESSITY: I certify, that based on my findings, NURSING services are a medically necessary home health service. HOME BOUND STATUS: I certify that my clinical findings support that this patient is homebound (i.e., Due to illness or injury, pt requires aid of supportive devices such as crutches, cane, wheelchairs, walkers, the use of special transportation or the assistance of another person to leave their place of residence. There is a normal inability to leave the home and doing so requires considerable and taxing effort. Other absences are for medical reasons / religious services and are infrequent or of short duration when for other reasons). If current dressing causes regression in wound condition, may D/C ordered dressing product/s and apply Normal Saline Moist Dressing daily until next [place] / Other MD appointment. [place] of regression in wound condition at [phone]. Wound #3 Right,Lateral Calcaneus: Continue Home Health Visits - [place] Nurse may visit PRN to address patient s wound care needs. FACE TO FACE ENCOUNTER: MEDICARE and MEDICAID PATIENTS: I certify that this patient is under my care and that I had a face-to-face encounter that meets the physician face-to-face encounter requirements with this patient on this date. The encounter with the patient was in whole or in part for the following MEDICAL CONDITION: (primary reason for [place]) MEDICAL  NECESSITY: I certify, that based on my findings, NURSING services are a medically necessary home health service. HOME BOUND STATUS: I certify that my clinical findings support that this patient is homebound (i.e., Due to illness or injury, pt requires aid of supportive devices such as crutches, cane, wheelchairs, walkers, the use of special [name], [place]. ([id number]) transportation or the assistance of another person to leave their place of residence. There is a normal inability to leave the home and doing so requires considerable and taxing effort. Other absences are for medical reasons / religious services and are infrequent or of short duration when for other reasons). If current dressing causes regression in wound condition, may D/C ordered dressing product/s and  apply Normal Saline Moist Dressing daily until next [place] / Other MD appointment. [place] of regression in wound condition at [phone]. Medications-please add to medication list.: Wound #1 Right,Anterior Lower Leg: Santyl Enzymatic Ointment Wound #2 Right,Posterior Lower Leg: Santyl Enzymatic Ointment Wound #3 Right,Lateral Calcaneus: Santyl Enzymatic Ointment Medical Decision Making 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scle 03/10/2018 Status: No change Complications: central tissue loss on the right anterior and posterior. Anterior covered by tightly adherent necrotic debris. 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 Date of Service: 03/31/2018 Medical Record Number: [id number] Patient Account Number: [id number] Date of Birth/Sex: Aug 14, 1946 (72 y.o. 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 Little I13.2 disease, or end stage renal disease Facility Procedures CPT4 Code: 17616073 Description: 71062 - DEB SUBQ TISSUE 20 SQ CM/< ICD-10 Diagnosis Description L97.213 Non-pressure chronic ulcer of right calf with necrosis of m Modifier: uscle Quantity: 1 CPT4 Code: [id number] Description: 11045 - DEB SUBQ TISS EA ADDL 20CM ICD-10 Diagnosis Description L97.213 Non-pressure chronic ulcer of right calf with necrosis of m Modifier: uscle Quantity: 1 Physician Procedures CPT4 Code: 7035009 Description: 11042 - WC PHYS SUBQ TISS 20 SQ CM ICD-10 Diagnosis Description L97.213 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 Modifier: scle Quantity: 1 CPT4 Code: [id number] Description: 11045 - WC PHYS SUBQ TISS EA ADDL 20 CM ICD-10 Diagnosis Description B71.696 Non-pressure chronic ulcer of right calf with necrosis of  mu Modifier: scle Quantity: 1 Electronic Signature(s) Signed: 04/02/2018 8:34:07 AM By: Linton Ham MD Entered By: Linton Ham on 03/31/2018 15:08:22

## 2018-04-03 DIAGNOSIS — N186 End stage renal disease: Secondary | ICD-10-CM | POA: Diagnosis not present

## 2018-04-03 DIAGNOSIS — E1129 Type 2 diabetes mellitus with other diabetic kidney complication: Secondary | ICD-10-CM | POA: Diagnosis not present

## 2018-04-03 DIAGNOSIS — D631 Anemia in chronic kidney disease: Secondary | ICD-10-CM | POA: Diagnosis not present

## 2018-04-03 DIAGNOSIS — N2581 Secondary hyperparathyroidism of renal origin: Secondary | ICD-10-CM | POA: Diagnosis not present

## 2018-04-03 DIAGNOSIS — E039 Hypothyroidism, unspecified: Secondary | ICD-10-CM | POA: Diagnosis not present

## 2018-04-03 DIAGNOSIS — D509 Iron deficiency anemia, unspecified: Secondary | ICD-10-CM | POA: Diagnosis not present

## 2018-04-03 DIAGNOSIS — E876 Hypokalemia: Secondary | ICD-10-CM | POA: Diagnosis not present

## 2018-04-03 DIAGNOSIS — D689 Coagulation defect, unspecified: Secondary | ICD-10-CM | POA: Diagnosis not present

## 2018-04-05 DIAGNOSIS — E1122 Type 2 diabetes mellitus with diabetic chronic kidney disease: Secondary | ICD-10-CM | POA: Diagnosis not present

## 2018-04-05 DIAGNOSIS — J449 Chronic obstructive pulmonary disease, unspecified: Secondary | ICD-10-CM | POA: Diagnosis not present

## 2018-04-05 DIAGNOSIS — I4891 Unspecified atrial fibrillation: Secondary | ICD-10-CM | POA: Diagnosis not present

## 2018-04-05 DIAGNOSIS — E785 Hyperlipidemia, unspecified: Secondary | ICD-10-CM | POA: Diagnosis not present

## 2018-04-05 DIAGNOSIS — H409 Unspecified glaucoma: Secondary | ICD-10-CM | POA: Diagnosis not present

## 2018-04-05 DIAGNOSIS — I872 Venous insufficiency (chronic) (peripheral): Secondary | ICD-10-CM | POA: Diagnosis not present

## 2018-04-05 DIAGNOSIS — I509 Heart failure, unspecified: Secondary | ICD-10-CM | POA: Diagnosis not present

## 2018-04-05 DIAGNOSIS — Z992 Dependence on renal dialysis: Secondary | ICD-10-CM | POA: Diagnosis not present

## 2018-04-05 DIAGNOSIS — M329 Systemic lupus erythematosus, unspecified: Secondary | ICD-10-CM | POA: Diagnosis not present

## 2018-04-05 DIAGNOSIS — Z87891 Personal history of nicotine dependence: Secondary | ICD-10-CM | POA: Diagnosis not present

## 2018-04-05 DIAGNOSIS — M109 Gout, unspecified: Secondary | ICD-10-CM | POA: Diagnosis not present

## 2018-04-05 DIAGNOSIS — N186 End stage renal disease: Secondary | ICD-10-CM | POA: Diagnosis not present

## 2018-04-05 DIAGNOSIS — M150 Primary generalized (osteo)arthritis: Secondary | ICD-10-CM | POA: Diagnosis not present

## 2018-04-05 DIAGNOSIS — Z794 Long term (current) use of insulin: Secondary | ICD-10-CM | POA: Diagnosis not present

## 2018-04-05 DIAGNOSIS — M1990 Unspecified osteoarthritis, unspecified site: Secondary | ICD-10-CM | POA: Diagnosis not present

## 2018-04-06 DIAGNOSIS — D689 Coagulation defect, unspecified: Secondary | ICD-10-CM | POA: Diagnosis not present

## 2018-04-06 DIAGNOSIS — N186 End stage renal disease: Secondary | ICD-10-CM | POA: Diagnosis not present

## 2018-04-06 DIAGNOSIS — E039 Hypothyroidism, unspecified: Secondary | ICD-10-CM | POA: Diagnosis not present

## 2018-04-06 DIAGNOSIS — N2581 Secondary hyperparathyroidism of renal origin: Secondary | ICD-10-CM | POA: Diagnosis not present

## 2018-04-06 DIAGNOSIS — D509 Iron deficiency anemia, unspecified: Secondary | ICD-10-CM | POA: Diagnosis not present

## 2018-04-06 DIAGNOSIS — D631 Anemia in chronic kidney disease: Secondary | ICD-10-CM | POA: Diagnosis not present

## 2018-04-06 DIAGNOSIS — E1129 Type 2 diabetes mellitus with other diabetic kidney complication: Secondary | ICD-10-CM | POA: Diagnosis not present

## 2018-04-06 DIAGNOSIS — E876 Hypokalemia: Secondary | ICD-10-CM | POA: Diagnosis not present

## 2018-04-07 ENCOUNTER — Encounter: Payer: Medicare Other | Admitting: Internal Medicine

## 2018-04-07 DIAGNOSIS — J449 Chronic obstructive pulmonary disease, unspecified: Secondary | ICD-10-CM | POA: Diagnosis not present

## 2018-04-07 DIAGNOSIS — Z79899 Other long term (current) drug therapy: Secondary | ICD-10-CM | POA: Diagnosis not present

## 2018-04-07 DIAGNOSIS — S91105A Unspecified open wound of left lesser toe(s) without damage to nail, initial encounter: Secondary | ICD-10-CM | POA: Diagnosis not present

## 2018-04-07 DIAGNOSIS — I10 Essential (primary) hypertension: Secondary | ICD-10-CM | POA: Diagnosis not present

## 2018-04-07 DIAGNOSIS — Z1389 Encounter for screening for other disorder: Secondary | ICD-10-CM | POA: Diagnosis not present

## 2018-04-07 DIAGNOSIS — M069 Rheumatoid arthritis, unspecified: Secondary | ICD-10-CM | POA: Diagnosis not present

## 2018-04-07 DIAGNOSIS — R310 Gross hematuria: Secondary | ICD-10-CM | POA: Diagnosis not present

## 2018-04-07 DIAGNOSIS — I509 Heart failure, unspecified: Secondary | ICD-10-CM | POA: Diagnosis not present

## 2018-04-07 DIAGNOSIS — L89610 Pressure ulcer of right heel, unstageable: Secondary | ICD-10-CM | POA: Diagnosis not present

## 2018-04-07 DIAGNOSIS — L930 Discoid lupus erythematosus: Secondary | ICD-10-CM | POA: Diagnosis not present

## 2018-04-07 DIAGNOSIS — L97419 Non-pressure chronic ulcer of right heel and midfoot with unspecified severity: Secondary | ICD-10-CM | POA: Diagnosis not present

## 2018-04-07 DIAGNOSIS — N185 Chronic kidney disease, stage 5: Secondary | ICD-10-CM | POA: Diagnosis not present

## 2018-04-07 DIAGNOSIS — L97912 Non-pressure chronic ulcer of unspecified part of right lower leg with fat layer exposed: Secondary | ICD-10-CM | POA: Diagnosis not present

## 2018-04-07 DIAGNOSIS — L89899 Pressure ulcer of other site, unspecified stage: Secondary | ICD-10-CM | POA: Diagnosis not present

## 2018-04-07 DIAGNOSIS — L97219 Non-pressure chronic ulcer of right calf with unspecified severity: Secondary | ICD-10-CM | POA: Diagnosis not present

## 2018-04-07 DIAGNOSIS — Z0001 Encounter for general adult medical examination with abnormal findings: Secondary | ICD-10-CM | POA: Diagnosis not present

## 2018-04-07 DIAGNOSIS — S81801A Unspecified open wound, right lower leg, initial encounter: Secondary | ICD-10-CM | POA: Diagnosis not present

## 2018-04-07 DIAGNOSIS — E1122 Type 2 diabetes mellitus with diabetic chronic kidney disease: Secondary | ICD-10-CM | POA: Diagnosis not present

## 2018-04-07 DIAGNOSIS — E1129 Type 2 diabetes mellitus with other diabetic kidney complication: Secondary | ICD-10-CM | POA: Diagnosis not present

## 2018-04-07 DIAGNOSIS — L942 Calcinosis cutis: Secondary | ICD-10-CM | POA: Diagnosis not present

## 2018-04-07 DIAGNOSIS — M109 Gout, unspecified: Secondary | ICD-10-CM | POA: Diagnosis not present

## 2018-04-07 DIAGNOSIS — I132 Hypertensive heart and chronic kidney disease with heart failure and with stage 5 chronic kidney disease, or end stage renal disease: Secondary | ICD-10-CM | POA: Diagnosis not present

## 2018-04-08 DIAGNOSIS — E876 Hypokalemia: Secondary | ICD-10-CM | POA: Diagnosis not present

## 2018-04-08 DIAGNOSIS — E039 Hypothyroidism, unspecified: Secondary | ICD-10-CM | POA: Diagnosis not present

## 2018-04-08 DIAGNOSIS — E1129 Type 2 diabetes mellitus with other diabetic kidney complication: Secondary | ICD-10-CM | POA: Diagnosis not present

## 2018-04-08 DIAGNOSIS — N186 End stage renal disease: Secondary | ICD-10-CM | POA: Diagnosis not present

## 2018-04-08 DIAGNOSIS — D509 Iron deficiency anemia, unspecified: Secondary | ICD-10-CM | POA: Diagnosis not present

## 2018-04-08 DIAGNOSIS — D631 Anemia in chronic kidney disease: Secondary | ICD-10-CM | POA: Diagnosis not present

## 2018-04-08 DIAGNOSIS — N2581 Secondary hyperparathyroidism of renal origin: Secondary | ICD-10-CM | POA: Diagnosis not present

## 2018-04-08 DIAGNOSIS — D689 Coagulation defect, unspecified: Secondary | ICD-10-CM | POA: Diagnosis not present

## 2018-04-08 NOTE — Progress Notes (Signed)
Dana, Little ([id number]) Visit Report for 04/07/2018 HPI Details Patient Name: [name], [name]. Date of Service: 04/07/2018 4:00 PM Medical Record Number: [id number] Patient Account Number: [id number] Date of Birth/Sex: 08-Oct-1945 (72 y.o. F) Treating RN: Roger Shelter Primary Care Provider: Sharilyn Sites Other Clinician: Referring Provider: Sharilyn Sites Treating Provider/Extender: Tito Dine in Treatment: 4 History of Present Illness HPI Description: ADMISSION 03/10/18; This is a 72 year old woman with a history of end-stage renal disease on hemodialysis, type 2 diabetes on insulin with excellent control, systemic lupus. Her problem began in October 2018 she developed a blister in an open wound on the right anterior tibial mid tibial area. This progressed and worsened until the tissue loss was substantial on the anterior and posterior right calf. she was followed in [place] by physical therapy at one point although I have not reviewed their notes. She apparently did not receive pulse lavage to the wound. I note that she was hospitalized from 03/01/18 through 03/03/18 predominantly for debridement of the wound by general surgery. She saw Dr. Arnoldo Morale who did not believe there was a role for surgical debridement. Was felt that definitive treatment would be amputation but the patient already been offered this by vein and vascular in [facility] and she refused arrangements were made for her to be sent to this clinic for review. The patient is already had a reasonably substantial review for this problem. she saw vein and vascular extensively over the course of this year.her noninvasive arterial studies done in December 2018 suggested a right superficial femoral artery occlusion/stenosis. She did not have any issues on the left. At that point her ABI on the right was 0.8 with monophasic waveforms 1.06 on the left. However she had a velocity drop off in the superficial  femoral artery. She went on to have diagnostic angiogram on 12/18/17 by Dr. Oneida Alar. This showed on the right that the common femoral, profunda femoris and superficial femoral arteries were all patent. There was a focal less than 1 mm length stenosis on the right superficial femoral artery otherwise the artery was widely patent. The right popliteal artery was widely patent. There was 3 vessel runoff via the anterior, tibial and paranoid Milta Deiters arteries to the right foot. She was not felt to have a vascular cause of these wounds. sHe's also had 2 separate biopsies. Firstly on 3/25 showed nonspecific findings with calciphylaxis not entirely excluded. No definite calcium deposits were seen within the dermis within the sections reviewed. Vascular features were nonspecific. Furthermore pyogenic granuloma may demonstrate similar findings. 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 Calciphylaxis was not favored in this specimen. There was a calcified vessel. I don't see a pathology specimen that specifically says definitive calciphylaxis. The patient had an x-ray of her right tibia while she is in hospital there was no osteomyelitis. She had an MRI of her foot in March that did not show osteomyelitis as well The patient has been using Betadine and wraps to the wounds. This is being done by a home health where they live in [place] 03/17/18; patient admitted to clinic last week. 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 Considerable amount of necrotic surface material that I removed from the posterior calf last week and again this week. The area anteriorly is far too adherent at this point. She lives in [place]. She has advanced  home care home health. Dialyzes in [place] as well. 03/31/18; presumed calciphylaxis with large  wounds on the right posterior calf greater than right anterior calf. There is very apparent necrotic material in the anterior side. We've been using Santyl anteriorly and silver alginate posteriorly. 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 I've gotten her an appointment at [facility] dermatology next week hopefully to have the original biopsy sites reviewed. The patient is still in a lot of pain. She is not straightening the leg out. She had an angiogram that did not suggest coexistent macrovascular disease 04/07/18; absolutely no improvement in this lady's condition. Extensive wounds on the right anterior covered in necrotic surface Elie, Quanetta F. ([id number]) and the right posterior calf. There is better looking tissue posteriorly on the right over there is a large exposed tendon. In addition today she seems to develop some wounds on her toes on the left foot which were new this week especially the left third toe most circumferential loss. This unfortunate woman is in a lot of pain and I once again I've talked to her about an amputation of the right leg which she doesn't want to consider. 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 She has a large necrotic covered wound on the right anterior tibia oLarge full-thickness wound on the right posterior tibia with healthier looking tissue but a large exposed piece of tendon oNecrotic wound on the right lateral heel oThe new problem this week is wounds on her toes Electronic Signature(s) Signed: 04/07/2018 6:13:38 PM By: Linton Ham MD Entered By: Linton Ham on 04/07/2018 17:28:34 [name] ([id number]) -------------------------------------------------------------------------------- Physical Exam Details Patient Name: [name]. Date of Service: 04/07/2018 4:00 PM Medical Record Number: [id number] Patient Account Number: [id number] Date of Birth/Sex: 12/23/1945  (72 y.o. F) Treating RN: Roger Shelter Primary Care Provider: Sharilyn Sites Other Clinician: Referring Provider: Sharilyn Sites Treating Provider/Extender: Tito Dine in Treatment: 4 Constitutional Sitting or standing Blood Pressure is within target range for patient.. Pulse regular and within target range for patient.Marland Kitchen Respirations regular, non-labored and within target range.. Temperature is normal and within the target range for the patient.Marland Kitchen appears in no distress. in alot of pain. Respiratory Respiratory effort is easy and symmetric bilaterally. Rate is normal at rest and on room air.. Cardiovascular femoral pulses palpable. think pedal pulses. Gastrointestinal (GI) no liver no spleen no masses no tenderness. Integumentary (Hair, Skin) outside of the wounds described below there is no other skin issues seen. Notes when examined oSubstantial tissue loss on the posterior right calf with exposed tendon. oLarge necrotic surface on the right posterior calf oNo attempt at debridement today. oRight lateral heel has a necrotic surface all continue with Santyl at some point I'll attempt to debride this -She had new wounds on the left third toe almost circumferential. The cause of this is not clear Electronic Signature(s) Signed: 04/07/2018 6:13:38 PM By: Linton Ham MD Entered By: Linton Ham on 04/07/2018 17:34:12 [name] ([id number]) -------------------------------------------------------------------------------- Physician Orders Details Patient Name: [name]. Date of Service: 04/07/2018 4:00 PM Medical Record Number: [id number] Patient Account Number: [id number] Date of Birth/Sex: 05-31-1946 (72 y.o. F) Treating RN: Secundino Ginger Primary Care Provider: Sharilyn Sites Other Clinician: Referring Provider: Sharilyn Sites Treating Provider/Extender: Tito Dine in Treatment: 4 Verbal / Phone Orders: No Diagnosis Coding Wound  Cleansing Wound #1 Right,Anterior Lower Leg o Clean wound with Normal Saline. Wound #2 Right,Posterior Lower Leg   o Clean wound with Normal Saline. Wound #3 Right,Lateral Calcaneus o Clean wound with Normal Saline. Wound #4 Left,Dorsal Metatarsal head first o Clean wound with Normal Saline. Wound #5 Left Toe Third o Clean wound with Normal Saline. Anesthetic (add to Medication List) Wound #1 Right,Anterior Lower Leg o Topical Lidocaine 4% cream applied to wound bed prior to debridement (In Clinic Only). Wound #2 Right,Posterior Lower Leg o Topical Lidocaine 4% cream applied to wound bed prior to debridement (In Clinic Only). Wound #3 Right,Lateral Calcaneus o Topical Lidocaine 4% cream applied to wound bed prior to debridement (In Clinic Only). Wound #4 Left,Dorsal Metatarsal head first o Topical Lidocaine 4% cream applied to wound bed prior to debridement (In Clinic Only). Wound #5 Left Toe Third o Topical Lidocaine 4% cream applied to wound bed prior to debridement (In Clinic Only). Primary Wound Dressing Wound #1 Right,Anterior Lower Leg o Santyl Ointment Wound #2 Right,Posterior Lower Leg o Silver Collagen Wound #3 Right,Lateral Calcaneus o Santyl Ointment Wound #4 Left,Dorsal Metatarsal head first o Silver Alginate Chani, Ghanem Raileigh F. ([id number]) Wound #5 Left Toe Third o Silver Alginate Secondary Dressing Wound #1 Right,Anterior Lower Leg o Other - Keramax or equal Wound #2 Right,Posterior Lower Leg o Other - Keramax or equal Wound #3 Right,Lateral Calcaneus o Other - Keramax or equal Wound #4 Left,Dorsal Metatarsal head first o Conform/Kerlix Wound #5 Left Toe Third o Conform/Kerlix Dressing Change Frequency Wound #1 Right,Anterior Lower Leg o Change Dressing Monday, Wednesday, Friday Wound #2 Right,Posterior Lower Leg o Change Dressing Monday, Wednesday, Friday Wound #3 Right,Lateral Calcaneus o Change Dressing  Monday, Wednesday, Friday Follow-up Appointments Wound #1 Right,Anterior Lower Leg o Return Appointment in 1 week. Wound #2 Right,Posterior Lower Leg o Return Appointment in 1 week. Wound #3 Right,Lateral Calcaneus o Return Appointment in 1 week. Edema Control Wound #1 Right,Anterior Lower Leg o Kerlix and Coban - Right Lower Extremity o Elevate legs to the level of the heart and pump ankles as often as possible Wound #2 Right,Posterior Lower Leg o Kerlix and Coban - Right Lower Extremity o Elevate legs to the level of the heart and pump ankles as often as possible Wound #3 Right,Lateral Calcaneus o Kerlix and Coban - Right Lower Extremity o Elevate legs to the level of the heart and pump ankles as often as possible Home Health Wound #1 Right,Anterior Lower Leg MARRANDA, ARAKELIAN ([id number]) o [place] Visits - [place] Nurse may visit PRN to address patientos wound care needs. o FACE TO FACE ENCOUNTER: MEDICARE and MEDICAID PATIENTS: I certify that this patient is under my care and that I had a face-to-face encounter that meets the physician face-to-face encounter requirements with this patient on this date. The encounter with the patient was in whole or in part for the following MEDICAL CONDITION: (primary reason for [place]) MEDICAL NECESSITY: I certify, that based on my findings, NURSING services are a medically necessary home health service. HOME BOUND STATUS: I certify that my clinical findings support that this patient is homebound (i.e., Due to illness or injury, pt requires aid of supportive devices such as crutches, cane, wheelchairs, walkers, the use of special transportation or the assistance of another person to leave their place of residence. There is a normal inability to leave the home and doing so requires considerable and taxing effort. Other absences are for medical reasons / religious services and are  infrequent or of short duration when for other reasons). o If current dressing causes regression  in wound condition, may D/C ordered dressing product/s and apply Normal Saline Moist Dressing daily until next [place] / Other MD appointment. [place] of regression in wound condition at [phone]. Wound #2 [place] Visits - [place] Nurse may visit PRN to address patientos wound care needs. o FACE TO FACE ENCOUNTER: MEDICARE and MEDICAID PATIENTS: I certify that this patient is under my care and that I had a face-to-face encounter that meets the physician face-to-face encounter requirements with this patient on this date. The encounter with the patient was in whole or in part for the following MEDICAL CONDITION: (primary reason for [place]) MEDICAL NECESSITY: I certify, that based on my findings, NURSING services are a medically necessary home health service. HOME BOUND STATUS: I certify that my clinical findings support that this patient is homebound (i.e., Due to illness or injury, pt requires aid of supportive devices such as crutches, cane, wheelchairs, walkers, the use of special transportation or the assistance of another person to leave their place of residence. There is a normal inability to leave the home and doing so requires considerable and taxing effort. Other absences are for medical reasons / religious services and are infrequent or of short duration when for other reasons). o If current dressing causes regression in wound condition, may D/C ordered dressing product/s and apply Normal Saline Moist Dressing daily until next [place] / Other MD appointment. [place] of regression in wound condition at [phone]. Wound #3 Right,Lateral Calcaneus o Continue Home Health Visits - [place] Nurse may visit PRN to address patientos  wound care needs. o FACE TO FACE ENCOUNTER: MEDICARE and MEDICAID PATIENTS: I certify that this patient is under my care and that I had a face-to-face encounter that meets the physician face-to-face encounter requirements with this patient on this date. The encounter with the patient was in whole or in part for the following MEDICAL CONDITION: (primary reason for [place]) MEDICAL NECESSITY: I certify, that based on my findings, NURSING services are a medically necessary home health service. HOME BOUND STATUS: I certify that my clinical findings support that this patient is homebound (i.e., Due to illness or injury, pt requires aid of supportive devices such as crutches, cane, wheelchairs, walkers, the use of special transportation or the assistance of another person to leave their place of residence. There is a normal inability to leave the home and doing so requires considerable and taxing effort. Other absences are for medical reasons / religious services and are infrequent or of short duration when for other reasons). o If current dressing causes regression in wound condition, may D/C ordered dressing product/s and apply Normal Saline Moist Dressing daily until next [place] / Other MD appointment. [place] of regression in wound condition at [phone]. Medications-please add to medication list. Wound #1 Right,Anterior Lower Leg o Santyl Enzymatic Ointment Wound #2 Right,Posterior Lower Leg o Santyl Enzymatic Ointment Wound #3 Right,Lateral Calcaneus LISSIE, HINESLEY. ([id number]) o Santyl Enzymatic Ointment Electronic Signature(s) Signed: 04/07/2018 5:04:11 PM By: Secundino Ginger Signed: 04/07/2018 6:13:38 PM By: Linton Ham MD Entered By: Secundino Ginger on 04/07/2018 17:00:36 [name] ([id number]) -------------------------------------------------------------------------------- Problem List Details Patient Name: TIFFANIE, BLASSINGAME. Date of Service: 04/07/2018 4:00 PM Medical Record Number: [id number] Patient Account Number: [id number] Date of Birth/Sex: 1946-08-17 (72 y.o. 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 were using Santyl to Medical change Anterior covered by tightly adherent necrotic debris. the large necrotic Decision areas anteriorly and Making : silver alginate posteriorly. 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 Date of Service: 04/07/2018 4:00 PM Medical Record Number: [id number] Patient Account Number:  [id number] Date of Birth/Sex: 06-16-1946 (72 y.o. F) Treating RN: Roger Shelter Primary Care Provider: Sharilyn Sites Other Clinician: Referring Provider: Sharilyn Sites Treating Provider/Extender: Tito Dine in Treatment: 4 Subjective History of Present Illness (HPI) ADMISSION 03/10/18; This is a 72 year old woman with a history of end-stage renal disease on hemodialysis, type 2 diabetes on insulin with excellent control, systemic lupus. Her problem began in October 2018 she developed a blister in an open wound on the right anterior tibial mid tibial area. This progressed and worsened until the tissue loss was substantial on the anterior and posterior right calf. she was followed in [place] by physical therapy at one point although I have not reviewed their notes. She apparently did not receive pulse lavage to the wound. I note that she was hospitalized from 03/01/18 through 03/03/18 predominantly for debridement of the wound by general surgery. She saw Dr. Arnoldo Morale who did not believe there was a role for surgical debridement. Was felt that definitive treatment would be amputation but the patient already been offered this by vein and vascular in [facility] and she refused arrangements were made for her to be sent to this clinic for review. The patient is already had a reasonably substantial review for this problem. she saw vein and vascular extensively over the course of this year.her noninvasive arterial studies done in December 2018 suggested a right superficial femoral artery occlusion/stenosis. She did not have any issues on the left. At that point her ABI on the right was 0.8 with monophasic waveforms 1.06 on the left. However she had a velocity drop off in the superficial femoral artery. She went on to have diagnostic angiogram on 12/18/17 by Dr. Oneida Alar. This showed on the right that the common femoral, profunda femoris and superficial femoral arteries were all patent. There  was a focal less than 1 mm length stenosis on the right superficial femoral artery otherwise the artery was widely patent. The right popliteal artery was widely patent. There was 3 vessel runoff via the anterior, tibial and paranoid Milta Deiters arteries to the right foot. She was not felt to have a vascular cause of these wounds. sHe's also had 2 separate biopsies. Firstly on 3/25 showed nonspecific  findings with calciphylaxis not entirely excluded. No definite calcium deposits were seen within the dermis within the sections reviewed. Vascular features were nonspecific. Furthermore pyogenic granuloma may demonstrate similar findings. 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 Calciphylaxis was not favored in this specimen. There was a calcified vessel. I don't see a pathology specimen that specifically says definitive calciphylaxis. The patient had an x-ray of her right tibia while she is in hospital there was no osteomyelitis. She had an MRI of her foot in March that did not show osteomyelitis as well The patient has been using Betadine and wraps to the wounds. This is being done by a home health where they live in [place] 03/17/18; patient admitted to clinic last week. 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 Considerable amount of necrotic surface material that I removed from the posterior calf last week and again this week. The area anteriorly is far too adherent at this point. She lives in [place]. She has advanced home care home health. Dialyzes in [place] as well. 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 There is very apparent necrotic material in the anterior side. We've been using Santyl anteriorly and silver alginate posteriorly. This  is presumed calciphylaxis. I've gotten her an appointment at [facility] dermatology next week hopefully to have the original biopsy sites reviewed. The patient is still in a lot of pain. She is not straightening the leg out. She had an angiogram that did not suggest coexistent macrovascular disease 04/07/18; absolutely no improvement in this lady's condition. Extensive wounds on the right anterior covered in necrotic surface and the right posterior calf. There is better looking tissue posteriorly on the right over there is a large exposed tendon. In addition today she seems to develop some wounds on her toes on the left foot which were new this week especially the left Zuniga, Akya F. ([id number]) third toe most circumferential loss. This unfortunate woman is in a lot of pain and I once again I've talked to her about an amputation of the right leg which she doesn't want to consider. 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 She has a large necrotic covered wound on the right anterior tibia Large full-thickness wound on the right posterior tibia with healthier looking tissue but a large exposed piece of tendon Necrotic wound on the right lateral heel The new problem this week is wounds on her toes Objective Constitutional Sitting or standing Blood Pressure is within target range for patient.. Pulse regular and within target range for patient.Marland Kitchen Respirations regular, non-labored and within target range.. Temperature is normal and within the target range for the patient.Marland Kitchen appears in no distress. in alot of pain. Vitals Time Taken: 4:05 PM, Height: 65 in, Weight: 164 lbs, BMI: 27.3, Temperature: 98.1 F, Pulse: 72 bpm, Respiratory Rate: 16 breaths/min, Blood Pressure: 107/55 mmHg. Respiratory Respiratory effort is easy and symmetric bilaterally. Rate is normal at rest and on room air.. Cardiovascular femoral pulses palpable.  think pedal pulses. Gastrointestinal (GI) no liver no spleen no masses no tenderness. General Notes: when examined Substantial tissue loss on the posterior right calf with exposed tendon. Large necrotic surface on the right posterior calf No attempt at debridement today. Right lateral heel has a necrotic  surface all continue with Santyl at some point I'll attempt to debride this -She had new wounds on the left third toe almost circumferential. The cause of this is not clear Integumentary (Hair, Skin) outside of the wounds described below there is no other skin issues seen. Wound #1 status is Open. Original cause of wound was Gradually Appeared. The wound is located on the Right,Anterior Lower Leg. The wound measures 12.3cm length x 5.5cm width x 0.3cm depth; 53.132cm^2 area and 15.94cm^3 volume. Wound #2 status is Open. Original cause of wound was Gradually Appeared. The wound is located on the Right,Posterior Lower Leg. The wound measures 13cm length x 8cm width x 0.4cm depth; 81.681cm^2 area and 32.673cm^3 volume. Wound #3 status is Open. Original cause of wound was Gradually Appeared. The wound is located on the Right,Lateral Calcaneus. The wound measures 1.2cm length x 1.4cm width x 0.2cm depth; 1.319cm^2 area and 0.264cm^3 volume. Wound #4 status is Open. Original cause of wound was Pressure Injury. The wound is located on the Left,Dorsal Metatarsal head first. The wound measures 2cm length x 1cm width x 0.1cm depth; 1.571cm^2 area and 0.157cm^3 volume. There is Fat Layer (Subcutaneous Tissue) Exposed exposed. There is no tunneling or undermining noted. There is a large amount of serosanguineous drainage noted. Foul odor after cleansing was noted. The wound margin is distinct with the outline attached [place]quilla, Downum Dorean F. ([id number]) to the wound base. There is medium (34-66%) pink granulation within the wound bed. There is a medium (34-66%) amount of necrotic tissue within the wound bed  including Adherent Slough. The periwound skin appearance did not exhibit: Callus, Crepitus, Excoriation, Induration, Rash, Scarring, Dry/Scaly, Maceration, Atrophie Blanche, Cyanosis, Ecchymosis, Hemosiderin Staining, Mottled, Pallor, Rubor, Erythema. Periwound temperature was noted as No Abnormality. The periwound has tenderness on palpation. Wound #5 status is Open. Original cause of wound was Pressure Injury. The wound is located on the Left Toe Third. The wound measures 2cm length x 2.4cm width x 0.1cm depth; 3.77cm^2 area and 0.377cm^3 volume. There is no tunneling or undermining noted. There is a large amount of serosanguineous drainage noted. Foul odor after cleansing was noted. The wound margin is distinct with the outline attached to the wound base. There is medium (34-66%) red, pink granulation within the wound bed. There is a medium (34-66%) amount of necrotic tissue within the wound bed including Adherent Slough. The periwound skin appearance did not exhibit: Callus, Crepitus, Excoriation, Induration, Rash, Scarring, Dry/Scaly, Maceration, Atrophie Blanche, Cyanosis, Ecchymosis, Hemosiderin Staining, Mottled, Pallor, Rubor, Erythema. Periwound temperature was noted as No Abnormality. The periwound has tenderness on palpation. 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 Wound #2 Right,Posterior Lower Leg: Clean wound with Normal Saline. Wound #3 Right,Lateral Calcaneus: Clean wound with Normal Saline. Wound #4 Left,Dorsal Metatarsal head first: Clean wound with Normal  Saline. Wound #5 Left Toe Third: Clean wound with Normal Saline. Anesthetic (add to Medication List): Wound #1 Right,Anterior Lower Leg: Topical Lidocaine 4% cream applied to wound bed prior to debridement (In Clinic Only). Wound #2 Right,Posterior Lower Leg: Topical Lidocaine 4% cream applied to wound bed prior to debridement (In Clinic Only). Wound #3 Right,Lateral Calcaneus: Topical  Lidocaine 4% cream applied to wound bed prior to debridement (In Clinic Only). Wound #4 Left,Dorsal Metatarsal head first: Topical Lidocaine 4% cream applied to wound bed prior to debridement (In Clinic Only). Wound #5 Left Toe Third: KAVYA, HAAG ([id number]) Topical Lidocaine 4% cream applied to wound bed prior to debridement (In Clinic Only). Primary Wound Dressing: Wound #1 Right,Anterior Lower Leg: Santyl Ointment Wound #2 Right,Posterior Lower Leg: Silver Collagen Wound #3 Right,Lateral Calcaneus: Santyl Ointment Wound #4 Left,Dorsal Metatarsal head first: Silver Alginate Wound #5 Left Toe Third: Silver Alginate Secondary Dressing: Wound #1 Right,Anterior Lower Leg: Other - Keramax or equal Wound #2 Right,Posterior Lower Leg: Other - Keramax or equal Wound #3 Right,Lateral Calcaneus: Other - Keramax or equal Wound #4 Left,Dorsal Metatarsal head first: Conform/Kerlix Wound #5 Left Toe Third: Conform/Kerlix Dressing Change Frequency: Wound #1 Right,Anterior Lower Leg: Change Dressing Monday, Wednesday, Friday Wound #2 Right,Posterior Lower Leg: Change Dressing Monday, Wednesday, Friday Wound #3 Right,Lateral Calcaneus: Change Dressing Monday, Wednesday, Friday Follow-up Appointments: Wound #1 Right,Anterior Lower Leg: Return Appointment in 1 week. Wound #2 Right,Posterior Lower Leg: Return Appointment in 1 week. Wound #3 Right,Lateral Calcaneus: Return Appointment in 1 week. Edema Control: Wound #1 Right,Anterior Lower Leg: Kerlix and Coban - Right Lower  Extremity Elevate legs to the level of the heart and pump ankles as often as possible Wound #2 Right,Posterior Lower Leg: Kerlix and Coban - Right Lower Extremity Elevate legs to the level of the heart and pump ankles as often as possible Wound #3 Right,Lateral Calcaneus: Kerlix and Coban - Right Lower Extremity Elevate legs to the level of the heart and pump ankles as often as possible Home Health: Wound #1 Right,Anterior Lower Leg: [place] Visits - [place] Nurse may visit PRN to address patient s wound care needs. FACE TO FACE ENCOUNTER: MEDICARE and MEDICAID PATIENTS: I certify that this patient is under my care and that I had a face-to-face encounter that meets the physician face-to-face encounter requirements with this patient on this date. The encounter with the patient was in whole or in part for the following MEDICAL CONDITION: (primary reason for [place]) MEDICAL NECESSITY: I certify, that based on my findings, NURSING services are a medically necessary home health service. HOME BOUND STATUS: I certify that my clinical findings support that this patient is homebound (i.e., Due to illness or injury, pt requires aid of supportive devices such as crutches, cane, wheelchairs, walkers, the use of special transportation or the assistance of another person to leave their place of residence. There is a normal inability to leave the home and doing so requires considerable and taxing effort. Other absences are for medical reasons / religious services and are infrequent or of short duration when for other reasons). LEASA, KINCANNON F. ([id number]) If current dressing causes regression in wound condition, may D/C ordered dressing product/s and apply Normal Saline Moist Dressing daily until next [place] / Other MD appointment. [place] of regression in wound condition at [phone]. Wound #2 Right,Posterior Lower Leg: [place] Visits - [place] Nurse may visit PRN to address patient s wound care needs. FACE TO FACE ENCOUNTER: MEDICARE and MEDICAID PATIENTS: I certify that this patient is under my care and that I had a face-to-face encounter that meets the physician face-to-face encounter requirements with this patient on this date. The encounter with the patient was in whole or in part for the following MEDICAL CONDITION: (primary reason for [place])  MEDICAL NECESSITY: I certify, that based on my findings, NURSING services are a medically necessary home health service. HOME BOUND STATUS: I certify that my clinical findings support that this patient is homebound (i.e., Due to illness or injury, pt requires aid of supportive devices such as crutches, cane, wheelchairs, walkers, the use of special transportation or the assistance of another person to leave their place of residence. There is a normal inability to leave the home and doing so requires considerable and taxing effort. Other absences are for medical reasons / religious services and are infrequent or of short duration when for other reasons). If current dressing causes regression in wound condition, may D/C ordered dressing product/s and apply Normal Saline Moist Dressing daily until next [place] / Other MD appointment. [place] of regression in wound condition at [phone]. Wound #3 Right,Lateral Calcaneus: Continue Home Health Visits - [place] Nurse may visit PRN to address patient s wound care needs. FACE TO FACE ENCOUNTER: MEDICARE and MEDICAID PATIENTS: I certify that this patient is under my care and that I had a face-to-face encounter that meets the physician face-to-face encounter requirements with this patient on this date. The encounter with the patient was in whole or in part for the following MEDICAL CONDITION: (primary reason for [place]) MEDICAL NECESSITY: I  certify, that based on my findings, NURSING services are a medically necessary home health service. HOME BOUND STATUS: I certify that my clinical findings support that this patient is homebound (i.e., Due to illness or injury, pt requires aid of supportive devices such as crutches, cane, wheelchairs, walkers, the use of special transportation or the assistance of another person to leave their place of residence. There is a normal inability to leave the home and doing so requires considerable and taxing effort. Other absences are for medical reasons / religious services and are infrequent or of short duration when for other reasons). If current dressing causes regression in wound condition, may D/C ordered dressing product/s and apply Normal Saline Moist Dressing daily until next [place] / Other MD appointment. [place] of regression in wound condition at [phone]. Medications-please add to medication list.: Wound #1 Right,Anterior Lower Leg: Santyl Enzymatic Ointment Wound #2 Right,Posterior Lower Leg: Santyl Enzymatic Ointment Wound #3 Right,Lateral Calcaneus: Santyl Enzymatic Ointment Medical Decision Making 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scle 03/10/2018 Status: No change Complications: central tissue loss on the right anterior and posterior. Anterior covered by tightly adherent necrotic debris. Interventions: were using Santyl to the large necrotic areas anteriorly and silver alginate posteriorly. 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 Lillia Carmel at [facility] dermatology. A large amount of lab work is been  ordered. CRISTAL, QADIR F. ([id number]) #2 although these wounds looked ischemic she had a reasonably normal angiogram in March by Dr. Oneida Alar of vein and [facility] [place]. 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 Dr. Lillia Carmel is having the slides sent to academic pathologist which certainly will be helpful #5 I discussed things with the patient today. She was offered an amputation of the right leg at some point and because  of the pain I actually went over this with her today. She is still resistant to that idea Electronic Signature(s) Signed: 04/07/2018 6:13:38 PM By: Linton Ham MD Entered By: Linton Ham on 04/07/2018 17:36:09 [name] ([id number]) -------------------------------------------------------------------------------- SuperBill Details Patient Name: [name]. Date of Service: 04/07/2018 Medical Record Number: [id number] Patient Account Number: [id number] Date of Birth/Sex: 1945/10/05 (72 y.o. 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 Little I13.2 disease, or end stage renal disease Facility Procedures CPT4 Code: 11031594 Description: [phone] - WOUND CARE VISIT-LEV 5 EST PT Modifier: Quantity: 1 Physician Procedures CPT4 Code: [id number] Description: 63817 - WC PHYS LEVEL 4 - EST PT ICD-10 Diagnosis Description L97.213  Non-pressure chronic ulcer of right calf with necrosis of L89.610 Pressure ulcer of right heel, unstageable 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 Modifier: muscle Quantity: 1 Electronic Signature(s) Signed: 04/07/2018 6:13:38 PM By: Linton Ham MD Previous Signature: 04/07/2018 5:04:11 PM Version By: Secundino Ginger Entered By: Linton Ham on 04/07/2018 17:37:07

## 2018-04-08 NOTE — Progress Notes (Signed)
[name], [name] ([id number]) Visit Report for 04/07/2018 Arrival Information Details Patient Name: [name], [name]. Date of Service: 04/07/2018 4:00 PM Medical Record Number: [id number] Patient Account Number: [id number] Date of Birth/Sex: 07-27-1946 (72 y.o. F) Treating RN: Secundino Ginger Primary Care Thresea Doble: Sharilyn Sites Other Clinician: Referring Greidys Deland: Sharilyn Sites Treating Westyn Keatley/Extender: Tito Dine in Treatment: 4 Visit Information History Since Last Visit Added or deleted any medications: No Patient Arrived: Wheel Chair Any new allergies or adverse reactions: No Arrival Time: 16:13 Had a fall or experienced change in No Accompanied By: spouse activities of daily living that may affect Transfer Assistance: EasyPivot Patient risk of falls: Lift Signs or symptoms of abuse/neglect since last visito No Patient Identification Verified: Yes Hospitalized since last visit: No Secondary Verification Process Yes Implantable device outside of the clinic excluding No Completed: cellular tissue based products placed in the center Patient Requires Transmission-Based No since last visit: Precautions: Pain Present Now: No Patient Has Alerts: Yes Patient Alerts: Patient on Blood Thinner Eliquis DM II Electronic Signature(s) Signed: 04/07/2018 5:04:11 PM By: Secundino Ginger Entered By: Secundino Ginger on 04/07/2018 16:14:58 [name] ([id number]) -------------------------------------------------------------------------------- Clinic Level of Care Assessment Details Patient Name: [name]. Date of Service: 04/07/2018 4:00 PM Medical Record Number: [id number] Patient Account Number: [id number] Date of Birth/Sex: [birth year] (72 y.o. F) Treating RN: Secundino Ginger Primary Care Davidson Palmieri: Sharilyn Sites Other Clinician: Referring Bhavika Schnider: Sharilyn Sites Treating Celicia Minahan/Extender: Tito Dine in Treatment: 4 Clinic Level of Care Assessment  Items TOOL 4 Quantity Score X - Use when only an EandM is performed on FOLLOW-UP visit 1 0 ASSESSMENTS - Nursing Assessment / Reassessment X - Reassessment of Co-morbidities (includes updates in patient status) 1 10 X- 1 5 Reassessment of Adherence to Treatment Plan ASSESSMENTS - Wound and Skin Assessment / Reassessment []  - Simple Wound Assessment / Reassessment - one wound 0 X- 5 5 Complex Wound Assessment / Reassessment - multiple wounds []  - 0 Dermatologic / Skin Assessment (not related to wound area) ASSESSMENTS - Focused Assessment []  - Circumferential Edema Measurements - multi extremities 0 []  - 0 Nutritional Assessment / Counseling / Intervention []  - 0 Lower Extremity Assessment (monofilament, tuning fork, pulses) []  - 0 Peripheral Arterial Disease Assessment (using hand held doppler) ASSESSMENTS - Ostomy and/or Continence Assessment and Care []  - Incontinence Assessment and Management 0 []  - 0 Ostomy Care Assessment and Management (repouching, etc.) PROCESS - Coordination of Care []  - Simple Patient / Family Education for ongoing care 0 X- 1 20 Complex (extensive) Patient / Family Education for ongoing care []  - 0 Staff obtains Programmer, systems, Records, Test Results / Process Orders []  - 0 Staff telephones HHA, Nursing Homes / Clarify orders / etc []  - 0 Routine Transfer to another Facility (non-emergent condition) []  - 0 Routine Hospital Admission (non-emergent condition) []  - 0 New Admissions / Biomedical engineer / Ordering NPWT, Apligraf, etc. []  - 0 Emergency Hospital Admission (emergent condition) []  - 0 Simple Discharge Coordination [name]. ([id number]) X- 1 15 Complex (extensive) Discharge Coordination PROCESS - Special Needs []  - Pediatric / Minor Patient Management 0 []  - 0 Isolation Patient Management []  - 0 Hearing / Language / Visual special needs []  - 0 Assessment of Community assistance (transportation, D/C planning, etc.) []   - 0 Additional assistance / Altered mentation []  - 0 Support Surface(s) Assessment (bed, cushion, seat, etc.) INTERVENTIONS - Wound Cleansing / Measurement []  - Simple Wound Cleansing - one wound 0 X- 5  5 Complex Wound Cleansing - multiple wounds []  - 0 Wound Imaging (photographs - any number of wounds) []  - 0 Wound Tracing (instead of photographs) []  - 0 Simple Wound Measurement - one wound X- 5 5 Complex Wound Measurement - multiple wounds INTERVENTIONS - Wound Dressings X - Small Wound Dressing one or multiple wounds 3 10 X- 2 15 Medium Wound Dressing one or multiple wounds []  - 0 Large Wound Dressing one or multiple wounds []  - 0 Application of Medications - topical []  - 0 Application of Medications - injection INTERVENTIONS - Miscellaneous []  - External ear exam 0 []  - 0 Specimen Collection (cultures, biopsies, blood, body fluids, etc.) []  - 0 Specimen(s) / Culture(s) sent or taken to Lab for analysis []  - 0 Patient Transfer (multiple staff / Civil Service fast streamer / Similar devices) []  - 0 Simple Staple / Suture removal (25 or less) []  - 0 Complex Staple / Suture removal (26 or more) []  - 0 Hypo / Hyperglycemic Management (close monitor of Blood Glucose) []  - 0 Ankle / Brachial Index (ABI) - do not check if billed separately X- 1 5 Vital Signs [name], Dana Etosha F. ([id number]) Has the patient been seen at the hospital within the last three years: Yes Total Score: 190 Level Of Care: New/Established - Level 5 Electronic Signature(s) Signed: 04/07/2018 5:04:11 PM By: Secundino Ginger Entered By: Secundino Ginger on 04/07/2018 17:01:06 [name] ([id number]) -------------------------------------------------------------------------------- Encounter Discharge Information Details Patient Name: [name]. Date of Service: 04/07/2018 4:00 PM Medical Record Number: [id number] Patient Account Number: [id number] Date of Birth/Sex: [birth year] (72 y.o. F) Treating RN: Secundino Ginger Primary Care Jerney Baksh: Sharilyn Sites Other Clinician: Referring Carollee Nussbaumer: Sharilyn Sites Treating Bhargav Barbaro/Extender: Tito Dine in Treatment: 4 Encounter Discharge Information Items Discharge Condition: Stable Ambulatory Status: Wheelchair Discharge Destination: Home Transportation: Private Auto Accompanied By: spouse Schedule Follow-up Appointment: Yes Clinical Summary of Care: Electronic Signature(s) Signed: 04/07/2018 5:05:08 PM By: Secundino Ginger Entered By: Secundino Ginger on 04/07/2018 17:03:45 [name] ([id number]) -------------------------------------------------------------------------------- Lower Extremity Assessment Details Patient Name: [name]. Date of Service: 04/07/2018 4:00 PM Medical Record Number: [id number] Patient Account Number: [id number] Date of Birth/Sex: 1945-10-25 (72 y.o. F) Treating RN: Secundino Ginger Primary Care Jasia Hiltunen: Sharilyn Sites Other Clinician: Referring Elysse Polidore: Sharilyn Sites Treating Keiarah Orlowski/Extender: Tito Dine in Treatment: 4 Edema Assessment Assessed: [Left: No] [Right: No] [Left: Edema] [Right: :] Calf Left: Right: Point of Measurement: 36 cm From Medial Instep 32.5 cm 30.5 cm Ankle Left: Right: Point of Measurement: 12 cm From Medial Instep 19 cm 20.5 cm Vascular Assessment Claudication: Claudication Assessment [Left:None] [Right:None] Pulses: Dorsalis Pedis Palpable: [Left:Yes] [Right:Yes] Posterior Tibial Extremity colors, hair growth, and conditions: Extremity Color: [Left:Hyperpigmented] [Right:Hyperpigmented] Hair Growth on Extremity: [Left:No] [Right:No] Temperature of Extremity: [Left:Cool] [Right:Cool] Capillary Refill: [Left:< 3 seconds] [Right:< 3 seconds] Toe Nail Assessment Left: Right: Thick: Yes Yes Discolored: Yes Yes Deformed: Yes Yes Improper Length and Hygiene: Yes Yes Electronic Signature(s) Signed: 04/07/2018 5:04:11 PM By: Secundino Ginger Entered By: Secundino Ginger on  04/07/2018 16:40:05 [name] ([id number]) -------------------------------------------------------------------------------- Multi Wound Chart Details Patient Name: [name]. Date of Service: 04/07/2018 4:00 PM Medical Record Number: [id number] Patient Account Number: [id number] Date of Birth/Sex: Nov 04, 1945 (72 y.o. 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 Lower Extremity Comorbid History: N/A N/A N/A Date Acquired: 06/29/2017 06/29/2017 06/29/2017 Weeks of Treatment: 4 4 4  Wound Status: Open Open Open Measurements L x W x D 12.3x5.5x0.3 13x8x0.4 1.2x1.4x0.2 (cm) Area (cm) : 53.132 81.681 1.319 Volume (cm) : 15.94 32.673 0.264 % Reduction in Area: -53.20% 28.80% -52.70% % Reduction in Volume: -129.80% 5.10% -207.00% Classification: Full Thickness Without Full Thickness With Exposed Full Thickness Without Exposed Support Structures Support Structures Exposed Support Structures Exudate Amount: N/A N/A N/A Exudate Type: N/A N/A N/A Exudate Color: N/A N/A N/A Foul Odor After Cleansing: N/A N/A N/A Odor Anticipated Due to N/A N/A N/A Product Use: Wound Margin: N/A N/A N/A Granulation Amount: N/A N/A N/A Granulation Quality: N/A N/A N/A Necrotic Amount: N/A N/A  N/A Epithelialization: N/A N/A N/A Periwound Skin Texture: No Abnormalities Noted No Abnormalities Noted No Abnormalities Noted [name], DAUGHTRY. ([id number]) Periwound Skin Moisture: No Abnormalities Noted No Abnormalities Noted No Abnormalities Noted Periwound Skin Color: No Abnormalities Noted No Abnormalities Noted No Abnormalities Noted Temperature: N/A N/A N/A Tenderness on Palpation: No No No Wound Preparation: N/A N/A N/A Wound Number: 4 5 N/A Photos: N/A Wound Location: Left Metatarsal head first - Left Toe Third N/A Dorsal Wounding Event: Pressure Injury Pressure Injury N/A Primary Etiology: Diabetic Wound/Ulcer of the Diabetic Wound/Ulcer of the N/A Lower Extremity Lower Extremity Secondary Etiology: N/A N/A N/A Comorbid History: Cataracts, Anemia, Chronic Cataracts, Anemia, Chronic N/A Obstructive Pulmonary Obstructive Pulmonary Disease (COPD), Arrhythmia, Disease (COPD), Arrhythmia, Hypertension, Type II Hypertension, Type II Diabetes, End Stage Renal Diabetes, End Stage Renal Disease, Gout, Rheumatoid Disease, Gout, Rheumatoid Arthritis Arthritis Date Acquired: 04/05/2018 04/05/2018 N/A Weeks of Treatment: 0 0 N/A Wound Status: Open Open N/A Measurements L x W x D 2x1x0.1 2x2.4x0.1 N/A (cm) Area (cm) : 1.571 3.77 N/A Volume (cm) : 0.157 0.377 N/A % Reduction in Area: N/A N/A N/A % Reduction in Volume: N/A N/A N/A Classification: Grade 1 Grade 1 N/A Exudate Amount: Large Large N/A Exudate Type: Serosanguineous Serosanguineous N/A Exudate Color: red, brown red, brown N/A Foul Odor After Cleansing: Yes Yes N/A Odor Anticipated Due to No No N/A Product Use: Wound Margin: Distinct, outline attached Distinct, outline attached N/A Granulation Amount: Medium (34-66%) Medium (34-66%) N/A Granulation Quality: Pink Red, Pink N/A Necrotic Amount: Medium (34-66%) Medium (34-66%) N/A Exposed Structures: Fat Layer (Subcutaneous Fascia: No N/A Tissue) Exposed: Yes Fat  Layer (Subcutaneous Fascia: No Tissue) Exposed: No Tendon: No Tendon: No Muscle: No Muscle: No Joint: No Joint: No Bone: No Bone: No Epithelialization: None None N/A Periwound Skin Texture: N/A [name], Dana F. ([id number]) Excoriation: No Excoriation: No Induration: No Induration: No Callus: No Callus: No Crepitus: No Crepitus: No Rash: No Rash: No Scarring: No Scarring: No Periwound Skin Moisture: Maceration: No Maceration: No N/A Dry/Scaly: No Dry/Scaly: No Periwound Skin Color: Atrophie Blanche: No Atrophie Blanche: No N/A Cyanosis: No Cyanosis: No Ecchymosis: No Ecchymosis: No Erythema: No Erythema: No Hemosiderin Staining: No Hemosiderin Staining: No Mottled: No Mottled: No Pallor: No Pallor: No Rubor: No Rubor: No Temperature: No Abnormality No Abnormality N/A Tenderness on Palpation: Yes Yes N/A Wound Preparation: Ulcer Cleansing: Ulcer Cleansing: N/A Rinsed/Irrigated with Saline Rinsed/Irrigated with Saline Topical Anesthetic Applied: Topical Anesthetic Applied: Other:  lidocaine 4% Other: lidocaine 4% Treatment Notes Wound #1 (Right, Anterior Lower Leg) 1. Cleansed with: Clean wound with Dakins/Anasept 2. Anesthetic Topical Lidocaine 4% cream to wound bed prior to debridement 4. Dressing Applied: Santyl Ointment 5. Secondary Dressing Applied ABD Pad Notes kerlix and coban wrap Wound #2 (Right, Posterior Lower Leg) 1. Cleansed with: Cleanse wound with antibacterial soap and water 2. Anesthetic Topical Lidocaine 4% cream to wound bed prior to debridement 4. Dressing Applied: Prisma Ag Other dressing (specify in notes) 5. Secondary Dressing Applied ABD Pad 7. Secured with Tape Other (specify in notes) Notes kerlix/coban wrap Panozzo, Larcenia F. ([id number]) Wound #3 (Right, Lateral Calcaneus) 1. Cleansed with: Clean wound with Dakins/Anasept 2. Anesthetic Topical Lidocaine 4% cream to wound bed prior to  debridement 4. Dressing Applied: Santyl Ointment 5. Secondary Dressing Applied ABD Pad Notes kerlix and coban wrap Wound #4 (Left, Dorsal Metatarsal head first) 1. Cleansed with: Clean wound with Normal Saline 2. Anesthetic Topical Lidocaine 4% cream to wound bed prior to debridement 4. Dressing Applied: Other dressing (specify in notes) Notes silvercell, confrom wrap Wound #5 (Left Toe Third) 1. Cleansed with: Clean wound with Normal Saline 2. Anesthetic Topical Lidocaine 4% cream to wound bed prior to debridement 4. Dressing Applied: Other dressing (specify in notes) Notes silvercell, confrom wrap Electronic Signature(s) Signed: 04/07/2018 6:13:38 PM By: Linton Ham MD Previous Signature: 04/07/2018 5:04:11 PM Version By: Secundino Ginger Entered By: Linton Ham on 04/07/2018 17:17:26 [name] ([id number]) -------------------------------------------------------------------------------- [place] Details Patient Name: [name], RHOADS. Date of Service: 04/07/2018 4:00 PM Medical Record Number: [id number] Patient Account Number: [id number] Date of Birth/Sex: 1946-08-08 (72 y.o. F) Treating RN: Secundino Ginger Primary Care Oluwatamilore Starnes: Sharilyn Sites Other Clinician: Referring Babyboy Loya: Sharilyn Sites Treating Shirl Weir/Extender: Tito Dine in Treatment: 4 Active Inactive ` Necrotic Tissue Nursing Diagnoses: Impaired tissue integrity related to necrotic/devitalized tissue Goals: Necrotic/devitalized tissue will be minimized in the wound bed Date Initiated: 03/10/2018 Target Resolution Date: 05/10/2018 Goal Status: Active Patient/caregiver will verbalize understanding of reason and process for debridement of necrotic tissue Date Initiated: 03/10/2018 Target Resolution Date: 03/25/2018 Goal Status: Active Interventions: Assess patient pain level pre-, during and post procedure and prior to discharge Treatment Activities: Apply topical  anesthetic as ordered : 03/10/2018 Notes: ` Orientation to the Wound Care Program Nursing Diagnoses: Knowledge deficit related to the wound healing center program Goals: Patient/caregiver will verbalize understanding of the [place] Date Initiated: 03/10/2018 Target Resolution Date: 04/02/2018 Goal Status: Active Interventions: Provide education on orientation to the wound center Notes: ` Soft Tissue Infection Nursing Diagnoses: Impaired tissue integrity Potential for infection: soft tissue [name], [name] ([id number]) Goals: Patient will remain free of wound infection Date Initiated: 03/10/2018 Target Resolution Date: 04/02/2018 Goal Status: Active Interventions: Assess signs and symptoms of infection every visit Notes: ` Wound/Skin Impairment Nursing Diagnoses: Impaired tissue integrity Goals: Ulcer/skin breakdown will have a volume reduction of 80% by week 12 Date Initiated: 03/10/2018 Target Resolution Date: 06/10/2018 Goal Status: Active Interventions: Assess ulceration(s) every visit Treatment Activities: Skin care regimen initiated : 03/10/2018 Topical wound management initiated : 03/10/2018 Notes: Electronic Signature(s) Signed: 04/07/2018 5:04:11 PM By: Secundino Ginger Entered By: Secundino Ginger on 04/07/2018 16:45:56 [name] ([id number]) -------------------------------------------------------------------------------- Pain Assessment Details Patient Name: [name]. Date of Service: 04/07/2018 4:00 PM Medical Record Number: [id number] Patient Account Number: [id number] Date of Birth/Sex: 08/13/1946 (72 y.o. F) Treating RN: Secundino Ginger Primary Care Lilliahna Schubring: Sharilyn Sites Other Clinician: Referring Terrilyn Tyner:  Sharilyn Sites Treating Noga Fogg/Extender: Tito Dine in Treatment: 4 Active Problems Location of Pain Severity and Description of Pain Patient Has Paino No Site Locations Pain Management and  Medication Current Pain Management: Goals for Pain Management Topical or injectable lidocaine is offered to patient for acute pain when surgical debridement is performed. If needed, Patient is instructed to use over the counter pain medication for the following 24-48 hours after debridement. Wound care MDs do not prescribed pain medications. Patient has chronic pain or uncontrolled pain. Patient has been instructed to make an appointment with their Primary Care Physician for pain management. Electronic Signature(s) Signed: 04/07/2018 5:04:11 PM By: Secundino Ginger Entered By: Secundino Ginger on 04/07/2018 17:03:29 [name] ([id number]) -------------------------------------------------------------------------------- Patient/Caregiver Education Details Patient Name: [name]. Date of Service: 04/07/2018 4:00 PM Medical Record Number: [id number] Patient Account Number: [id number] Date of Birth/Gender: 1945-11-24 (72 y.o. F) Treating RN: Secundino Ginger Primary Care Physician: Sharilyn Sites Other Clinician: Referring Physician: Sharilyn Sites Treating Physician/Extender: Tito Dine in Treatment: 4 Education Assessment Education Provided To: Patient Education Topics Provided Wound Debridement: Handouts: Wound Debridement Methods: Explain/Verbal Responses: State content correctly Electronic Signature(s) Signed: 04/07/2018 5:05:08 PM By: Secundino Ginger Entered By: Secundino Ginger on 04/07/2018 17:04:08 [name] ([id number]) -------------------------------------------------------------------------------- Wound Assessment Details Patient Name: [name]. Date of Service: 04/07/2018 4:00 PM Medical Record Number: [id number] Patient Account Number: [id number] Date of Birth/Sex: 1946/06/29 (72 y.o. F) Treating RN: Secundino Ginger Primary Care Marlean Mortell: Sharilyn Sites Other Clinician: Referring Alyssah Algeo: Sharilyn Sites Treating Keenon Leitzel/Extender: Tito Dine  in Treatment: 4 Wound Status Wound Number: 1 Primary Etiology: Calciphylaxis Wound Location: Right, Anterior Lower Leg Secondary Diabetic Wound/Ulcer of the Lower Etiology: Extremity Wounding Event: Gradually Appeared Wound Status: Open Date Acquired: 06/29/2017 Weeks Of Treatment: 4 Clustered Wound: No Photos Photo Uploaded By: Secundino Ginger on 04/07/2018 16:50:31 Wound Measurements Length: (cm) 12.3 Width: (cm) 5.5 Depth: (cm) 0.3 Area: (cm) 53.132 Volume: (cm) 15.94 % Reduction in Area: -53.2% % Reduction in Volume: -129.8% Wound Description Full Thickness Without Exposed Support Classification: Structures Periwound Skin Texture Texture Color No Abnormalities Noted: No No Abnormalities Noted: No Moisture No Abnormalities Noted: No Treatment Notes Wound #1 (Right, Anterior Lower Leg) 1. Cleansed with: Clean wound with Dakins/Anasept 2. Anesthetic Topical Lidocaine 4% cream to wound bed prior to debridement [name], Dana F. ([id number]) 4. Dressing Applied: Santyl Ointment 5. Secondary Dressing Applied ABD Pad Notes kerlix and coban wrap Electronic Signature(s) Signed: 04/07/2018 5:04:11 PM By: Secundino Ginger Entered By: Secundino Ginger on 04/07/2018 16:29:16 [name] ([id number]) -------------------------------------------------------------------------------- Wound Assessment Details Patient Name: [name]. Date of Service: 04/07/2018 4:00 PM Medical Record Number: [id number] Patient Account Number: [id number] Date of Birth/Sex: 06/17/1946 (72 y.o. F) Treating RN: Secundino Ginger Primary Care Cathryn Gallery: Sharilyn Sites Other Clinician: Referring Guiselle Mian: Sharilyn Sites Treating Janera Peugh/Extender: Tito Dine in Treatment: 4 Wound Status Wound Number: 2 Primary Etiology: Calciphylaxis Wound Location: Right, Posterior Lower Leg Secondary Diabetic Wound/Ulcer of the Lower Etiology: Extremity Wounding Event: Gradually Appeared Wound  Status: Open Date Acquired: 06/29/2017 Weeks Of Treatment: 4 Clustered Wound: No Photos Photo Uploaded By: Secundino Ginger on 04/07/2018 16:50:32 Wound Measurements Length: (cm) 13 Width: (cm) 8 Depth: (cm) 0.4 Area: (cm) 81.681 Volume: (cm) 32.673 % Reduction in Area: 28.8% % Reduction in Volume: 5.1% Wound Description Full Thickness With Exposed Support Classification: Structures Periwound Skin Texture Texture Color No Abnormalities Noted: No No Abnormalities Noted: No Moisture No Abnormalities Noted: No  Treatment Notes Wound #2 (Right, Posterior Lower Leg) 1. Cleansed with: Cleanse wound with antibacterial soap and water 2. Anesthetic Topical Lidocaine 4% cream to wound bed prior to debridement [name], Dana F. ([id number]) 4. Dressing Applied: Prisma Ag Other dressing (specify in notes) 5. Secondary Dressing Applied ABD Pad 7. Secured with Tape Other (specify in notes) Notes kerlix/coban wrap Electronic Signature(s) Signed: 04/07/2018 5:04:11 PM By: Secundino Ginger Entered By: Secundino Ginger on 04/07/2018 16:29:16 [name] ([id number]) -------------------------------------------------------------------------------- Wound Assessment Details Patient Name: [name]. Date of Service: 04/07/2018 4:00 PM Medical Record Number: [id number] Patient Account Number: [id number] Date of Birth/Sex: 04-16-1946 (72 y.o. F) Treating RN: Secundino Ginger Primary Care Thaddaeus Granja: Sharilyn Sites Other Clinician: Referring Meliya Mcconahy: Sharilyn Sites Treating Roselind Klus/Extender: Tito Dine in Treatment: 4 Wound Status Wound Number: 3 Primary Etiology: Calciphylaxis Wound Location: Right, Lateral Calcaneus Secondary Diabetic Wound/Ulcer of the Lower Etiology: Extremity Wounding Event: Gradually Appeared Wound Status: Open Date Acquired: 06/29/2017 Weeks Of Treatment: 4 Clustered Wound: No Photos Photo Uploaded By: Secundino Ginger on 04/07/2018 16:52:07 Wound  Measurements Length: (cm) 1.2 Width: (cm) 1.4 Depth: (cm) 0.2 Area: (cm) 1.319 Volume: (cm) 0.264 % Reduction in Area: -52.7% % Reduction in Volume: -207% Wound Description Full Thickness Without Exposed Support Classification: Structures Periwound Skin Texture Texture Color No Abnormalities Noted: No No Abnormalities Noted: No Moisture No Abnormalities Noted: No Treatment Notes Wound #3 (Right, Lateral Calcaneus) 1. Cleansed with: Clean wound with Dakins/Anasept 2. Anesthetic Topical Lidocaine 4% cream to wound bed prior to debridement [name], Dana F. ([id number]) 4. Dressing Applied: Santyl Ointment 5. Secondary Dressing Applied ABD Pad Notes kerlix and coban wrap Electronic Signature(s) Signed: 04/07/2018 5:04:11 PM By: Secundino Ginger Entered By: Secundino Ginger on 04/07/2018 16:29:17 [name] ([id number]) -------------------------------------------------------------------------------- Wound Assessment Details Patient Name: [name]. Date of Service: 04/07/2018 4:00 PM Medical Record Number: [id number] Patient Account Number: [id number] Date of Birth/Sex: June 15, 1946 (72 y.o. F) Treating RN: Secundino Ginger Primary Care Uriel Dowding: Sharilyn Sites Other Clinician: Referring Kaden Dunkel: Sharilyn Sites Treating Amarilys Lyles/Extender: Tito Dine in Treatment: 4 Wound Status Wound Number: 4 Primary Diabetic Wound/Ulcer of the Lower Extremity Etiology: Wound Location: Left Metatarsal head first - Dorsal Wound Open Wounding Event: Pressure Injury Status: Date Acquired: 04/05/2018 Comorbid Cataracts, Anemia, Chronic Obstructive Weeks Of Treatment: 0 History: Pulmonary Disease (COPD), Arrhythmia, Clustered Wound: No Hypertension, Type II Diabetes, End Stage Renal Disease, Gout, Rheumatoid Arthritis Photos Photo Uploaded By: Secundino Ginger on 04/07/2018 16:56:15 Wound Measurements Length: (cm) 2 Width: (cm) 1 Depth: (cm) 0.1 Area: (cm)  1.571 Volume: (cm) 0.157 % Reduction in Area: % Reduction in Volume: Epithelialization: None Tunneling: No Undermining: No Wound Description Classification: Grade 1 Wound Margin: Distinct, outline attached Exudate Amount: Large Exudate Type: Serosanguineous Exudate Color: red, brown Foul Odor After Cleansing: Yes Due to Product Use: No Slough/Fibrino Yes Wound Bed Granulation Amount: Medium (34-66%) Exposed Structure Granulation Quality: Pink Fascia Exposed: No Necrotic Amount: Medium (34-66%) Fat Layer (Subcutaneous Tissue) Exposed: Yes Necrotic Quality: Adherent Slough Tendon Exposed: No Muscle Exposed: No Joint Exposed: No Bone Exposed: No [name], Dana F. ([id number]) Periwound Skin Texture Texture Color No Abnormalities Noted: No No Abnormalities Noted: No Callus: No Atrophie Blanche: No Crepitus: No Cyanosis: No Excoriation: No Ecchymosis: No Induration: No Erythema: No Rash: No Hemosiderin Staining: No Scarring: No Mottled: No Pallor: No Moisture Rubor: No No Abnormalities Noted: No Dry / Scaly: No Temperature / Pain Maceration: No Temperature: No Abnormality Tenderness on Palpation: Yes Wound Preparation Ulcer  Cleansing: Rinsed/Irrigated with Saline Topical Anesthetic Applied: Other: lidocaine 4%, Treatment Notes Wound #4 (Left, Dorsal Metatarsal head first) 1. Cleansed with: Clean wound with Normal Saline 2. Anesthetic Topical Lidocaine 4% cream to wound bed prior to debridement 4. Dressing Applied: Other dressing (specify in notes) Notes silvercell, confrom wrap Electronic Signature(s) Signed: 04/07/2018 5:04:11 PM By: Secundino Ginger Entered By: Secundino Ginger on 04/07/2018 16:34:01 Dana Huger F. ([id number]) -------------------------------------------------------------------------------- Wound Assessment Details Patient Name: [name]. Date of Service: 04/07/2018 4:00 PM Medical Record Number: [id number] Patient Account  Number: [id number] Date of Birth/Sex: [birth year] (72 y.o. F) Treating RN: Secundino Ginger Primary Care Aleka Twitty: Sharilyn Sites Other Clinician: Referring Worthy Boschert: Sharilyn Sites Treating Delani Kohli/Extender: Tito Dine in Treatment: 4 Wound Status Wound Number: 5 Primary Diabetic Wound/Ulcer of the Lower Extremity Etiology: Wound Location: Left Toe Third Wound Open Wounding Event: Pressure Injury Status: Date Acquired: 04/05/2018 Comorbid Cataracts, Anemia, Chronic Obstructive Weeks Of Treatment: 0 History: Pulmonary Disease (COPD), Arrhythmia, Clustered Wound: No Hypertension, Type II Diabetes, End Stage Renal Disease, Gout, Rheumatoid Arthritis Photos Photo Uploaded By: Secundino Ginger on 04/07/2018 16:54:42 Wound Measurements Length: (cm) 2 % Reduction Width: (cm) 2.4 % Reduction Depth: (cm) 0.1 Epitheliali Area: (cm) 3.77 Tunneling: Volume: (cm) 0.377 Underminin in Area: in Volume: zation: None No g: No Wound Description Classification: Grade 1 Foul Odor Wound Margin: Distinct, outline attached Due to Pro Exudate Amount: Large Slough/Fib Exudate Type: Serosanguineous Exudate Color: red, brown After Cleansing: Yes duct Use: No rino Yes Wound Bed Granulation Amount: Medium (34-66%) Exposed Structure Granulation Quality: Red, Pink Fascia Exposed: No Necrotic Amount: Medium (34-66%) Fat Layer (Subcutaneous Tissue) Exposed: No Necrotic Quality: Adherent Slough Tendon Exposed: No Muscle Exposed: No Joint Exposed: No Bone Exposed: No [name], Skufca Virgie F. ([id number]) Periwound Skin Texture Texture Color No Abnormalities Noted: No No Abnormalities Noted: No Callus: No Atrophie Blanche: No Crepitus: No Cyanosis: No Excoriation: No Ecchymosis: No Induration: No Erythema: No Rash: No Hemosiderin Staining: No Scarring: No Mottled: No Pallor: No Moisture Rubor: No No Abnormalities Noted: No Dry / Scaly: No Temperature / Pain Maceration:  No Temperature: No Abnormality Tenderness on Palpation: Yes Wound Preparation Ulcer Cleansing: Rinsed/Irrigated with Saline Topical Anesthetic Applied: Other: lidocaine 4%, Treatment Notes Wound #5 (Left Toe Third) 1. Cleansed with: Clean wound with Normal Saline 2. Anesthetic Topical Lidocaine 4% cream to wound bed prior to debridement 4. Dressing Applied: Other dressing (specify in notes) Notes silvercell, confrom wrap Electronic Signature(s) Signed: 04/07/2018 5:04:11 PM By: Secundino Ginger Entered By: Secundino Ginger on 04/07/2018 16:37:45 [name] ([id number]) -------------------------------------------------------------------------------- Vitals Details Patient Name: [name]. Date of Service: 04/07/2018 4:00 PM Medical Record Number: [id number] Patient Account Number: [id number] Date of Birth/Sex: 1946-08-06 (72 y.o. F) Treating RN: Secundino Ginger Primary Care Zaniya Mcaulay: Sharilyn Sites Other Clinician: Referring Graciana Sessa: Sharilyn Sites Treating Betzabe Bevans/Extender: Tito Dine in Treatment: 4 Vital Signs Time Taken: 16:05 Temperature (F): 98.1 Height (in): 65 Pulse (bpm): 72 Weight (lbs): 164 Respiratory Rate (breaths/min): 16 Body Mass Index (BMI): 27.3 Blood Pressure (mmHg): 107/55 Reference Range: 80 - 120 mg / dl Electronic Signature(s) Signed: 04/07/2018 5:04:11 PM By: Secundino Ginger Entered By: Secundino Ginger on 04/07/2018 16:16:45

## 2018-04-09 DIAGNOSIS — N186 End stage renal disease: Secondary | ICD-10-CM | POA: Diagnosis not present

## 2018-04-09 DIAGNOSIS — Z992 Dependence on renal dialysis: Secondary | ICD-10-CM | POA: Diagnosis not present

## 2018-04-09 DIAGNOSIS — M109 Gout, unspecified: Secondary | ICD-10-CM | POA: Diagnosis not present

## 2018-04-09 DIAGNOSIS — N390 Urinary tract infection, site not specified: Secondary | ICD-10-CM | POA: Diagnosis not present

## 2018-04-09 DIAGNOSIS — E785 Hyperlipidemia, unspecified: Secondary | ICD-10-CM | POA: Diagnosis not present

## 2018-04-09 DIAGNOSIS — Z87891 Personal history of nicotine dependence: Secondary | ICD-10-CM | POA: Diagnosis not present

## 2018-04-09 DIAGNOSIS — M150 Primary generalized (osteo)arthritis: Secondary | ICD-10-CM | POA: Diagnosis not present

## 2018-04-09 DIAGNOSIS — J449 Chronic obstructive pulmonary disease, unspecified: Secondary | ICD-10-CM | POA: Diagnosis not present

## 2018-04-09 DIAGNOSIS — R310 Gross hematuria: Secondary | ICD-10-CM | POA: Diagnosis not present

## 2018-04-09 DIAGNOSIS — H409 Unspecified glaucoma: Secondary | ICD-10-CM | POA: Diagnosis not present

## 2018-04-09 DIAGNOSIS — M329 Systemic lupus erythematosus, unspecified: Secondary | ICD-10-CM | POA: Diagnosis not present

## 2018-04-09 DIAGNOSIS — Z79899 Other long term (current) drug therapy: Secondary | ICD-10-CM | POA: Diagnosis not present

## 2018-04-09 DIAGNOSIS — Z794 Long term (current) use of insulin: Secondary | ICD-10-CM | POA: Diagnosis not present

## 2018-04-09 DIAGNOSIS — E1122 Type 2 diabetes mellitus with diabetic chronic kidney disease: Secondary | ICD-10-CM | POA: Diagnosis not present

## 2018-04-09 DIAGNOSIS — L930 Discoid lupus erythematosus: Secondary | ICD-10-CM | POA: Diagnosis not present

## 2018-04-09 DIAGNOSIS — I4891 Unspecified atrial fibrillation: Secondary | ICD-10-CM | POA: Diagnosis not present

## 2018-04-09 DIAGNOSIS — I872 Venous insufficiency (chronic) (peripheral): Secondary | ICD-10-CM | POA: Diagnosis not present

## 2018-04-10 DIAGNOSIS — D631 Anemia in chronic kidney disease: Secondary | ICD-10-CM | POA: Diagnosis not present

## 2018-04-10 DIAGNOSIS — N2581 Secondary hyperparathyroidism of renal origin: Secondary | ICD-10-CM | POA: Diagnosis not present

## 2018-04-10 DIAGNOSIS — E1129 Type 2 diabetes mellitus with other diabetic kidney complication: Secondary | ICD-10-CM | POA: Diagnosis not present

## 2018-04-10 DIAGNOSIS — D509 Iron deficiency anemia, unspecified: Secondary | ICD-10-CM | POA: Diagnosis not present

## 2018-04-10 DIAGNOSIS — D689 Coagulation defect, unspecified: Secondary | ICD-10-CM | POA: Diagnosis not present

## 2018-04-10 DIAGNOSIS — N186 End stage renal disease: Secondary | ICD-10-CM | POA: Diagnosis not present

## 2018-04-10 DIAGNOSIS — E876 Hypokalemia: Secondary | ICD-10-CM | POA: Diagnosis not present

## 2018-04-10 DIAGNOSIS — E039 Hypothyroidism, unspecified: Secondary | ICD-10-CM | POA: Diagnosis not present

## 2018-04-12 DIAGNOSIS — L89892 Pressure ulcer of other site, stage 2: Secondary | ICD-10-CM | POA: Diagnosis not present

## 2018-04-12 DIAGNOSIS — M150 Primary generalized (osteo)arthritis: Secondary | ICD-10-CM | POA: Diagnosis not present

## 2018-04-12 DIAGNOSIS — I4891 Unspecified atrial fibrillation: Secondary | ICD-10-CM | POA: Diagnosis not present

## 2018-04-12 DIAGNOSIS — Z992 Dependence on renal dialysis: Secondary | ICD-10-CM | POA: Diagnosis not present

## 2018-04-12 DIAGNOSIS — N186 End stage renal disease: Secondary | ICD-10-CM | POA: Diagnosis not present

## 2018-04-12 DIAGNOSIS — J449 Chronic obstructive pulmonary disease, unspecified: Secondary | ICD-10-CM | POA: Diagnosis not present

## 2018-04-12 DIAGNOSIS — H409 Unspecified glaucoma: Secondary | ICD-10-CM | POA: Diagnosis not present

## 2018-04-12 DIAGNOSIS — I872 Venous insufficiency (chronic) (peripheral): Secondary | ICD-10-CM | POA: Diagnosis not present

## 2018-04-12 DIAGNOSIS — E1122 Type 2 diabetes mellitus with diabetic chronic kidney disease: Secondary | ICD-10-CM | POA: Diagnosis not present

## 2018-04-12 DIAGNOSIS — M109 Gout, unspecified: Secondary | ICD-10-CM | POA: Diagnosis not present

## 2018-04-12 DIAGNOSIS — E1151 Type 2 diabetes mellitus with diabetic peripheral angiopathy without gangrene: Secondary | ICD-10-CM | POA: Diagnosis not present

## 2018-04-12 DIAGNOSIS — M329 Systemic lupus erythematosus, unspecified: Secondary | ICD-10-CM | POA: Diagnosis not present

## 2018-04-12 DIAGNOSIS — E1140 Type 2 diabetes mellitus with diabetic neuropathy, unspecified: Secondary | ICD-10-CM | POA: Diagnosis not present

## 2018-04-12 DIAGNOSIS — Z794 Long term (current) use of insulin: Secondary | ICD-10-CM | POA: Diagnosis not present

## 2018-04-12 DIAGNOSIS — E785 Hyperlipidemia, unspecified: Secondary | ICD-10-CM | POA: Diagnosis not present

## 2018-04-12 DIAGNOSIS — Z87891 Personal history of nicotine dependence: Secondary | ICD-10-CM | POA: Diagnosis not present

## 2018-04-13 DIAGNOSIS — D689 Coagulation defect, unspecified: Secondary | ICD-10-CM | POA: Diagnosis not present

## 2018-04-13 DIAGNOSIS — D631 Anemia in chronic kidney disease: Secondary | ICD-10-CM | POA: Diagnosis not present

## 2018-04-13 DIAGNOSIS — E876 Hypokalemia: Secondary | ICD-10-CM | POA: Diagnosis not present

## 2018-04-13 DIAGNOSIS — E039 Hypothyroidism, unspecified: Secondary | ICD-10-CM | POA: Diagnosis not present

## 2018-04-13 DIAGNOSIS — N2581 Secondary hyperparathyroidism of renal origin: Secondary | ICD-10-CM | POA: Diagnosis not present

## 2018-04-13 DIAGNOSIS — D509 Iron deficiency anemia, unspecified: Secondary | ICD-10-CM | POA: Diagnosis not present

## 2018-04-13 DIAGNOSIS — N186 End stage renal disease: Secondary | ICD-10-CM | POA: Diagnosis not present

## 2018-04-13 DIAGNOSIS — E1129 Type 2 diabetes mellitus with other diabetic kidney complication: Secondary | ICD-10-CM | POA: Diagnosis not present

## 2018-04-14 ENCOUNTER — Ambulatory Visit: Payer: Medicare Other | Admitting: Orthopaedic Surgery

## 2018-04-14 ENCOUNTER — Encounter: Payer: Medicare Other | Admitting: Internal Medicine

## 2018-04-14 DIAGNOSIS — S91301A Unspecified open wound, right foot, initial encounter: Secondary | ICD-10-CM | POA: Diagnosis not present

## 2018-04-14 DIAGNOSIS — M069 Rheumatoid arthritis, unspecified: Secondary | ICD-10-CM | POA: Diagnosis not present

## 2018-04-14 DIAGNOSIS — I509 Heart failure, unspecified: Secondary | ICD-10-CM | POA: Diagnosis not present

## 2018-04-14 DIAGNOSIS — L89610 Pressure ulcer of right heel, unstageable: Secondary | ICD-10-CM | POA: Diagnosis not present

## 2018-04-14 DIAGNOSIS — J449 Chronic obstructive pulmonary disease, unspecified: Secondary | ICD-10-CM | POA: Diagnosis not present

## 2018-04-14 DIAGNOSIS — I132 Hypertensive heart and chronic kidney disease with heart failure and with stage 5 chronic kidney disease, or end stage renal disease: Secondary | ICD-10-CM | POA: Diagnosis not present

## 2018-04-14 DIAGNOSIS — E1122 Type 2 diabetes mellitus with diabetic chronic kidney disease: Secondary | ICD-10-CM | POA: Diagnosis not present

## 2018-04-14 DIAGNOSIS — N185 Chronic kidney disease, stage 5: Secondary | ICD-10-CM | POA: Diagnosis not present

## 2018-04-14 DIAGNOSIS — L89899 Pressure ulcer of other site, unspecified stage: Secondary | ICD-10-CM | POA: Diagnosis not present

## 2018-04-14 DIAGNOSIS — L942 Calcinosis cutis: Secondary | ICD-10-CM | POA: Diagnosis not present

## 2018-04-14 DIAGNOSIS — M109 Gout, unspecified: Secondary | ICD-10-CM | POA: Diagnosis not present

## 2018-04-15 DIAGNOSIS — E876 Hypokalemia: Secondary | ICD-10-CM | POA: Diagnosis not present

## 2018-04-15 DIAGNOSIS — E039 Hypothyroidism, unspecified: Secondary | ICD-10-CM | POA: Diagnosis not present

## 2018-04-15 DIAGNOSIS — D509 Iron deficiency anemia, unspecified: Secondary | ICD-10-CM | POA: Diagnosis not present

## 2018-04-15 DIAGNOSIS — N2581 Secondary hyperparathyroidism of renal origin: Secondary | ICD-10-CM | POA: Diagnosis not present

## 2018-04-15 DIAGNOSIS — N186 End stage renal disease: Secondary | ICD-10-CM | POA: Diagnosis not present

## 2018-04-15 DIAGNOSIS — D689 Coagulation defect, unspecified: Secondary | ICD-10-CM | POA: Diagnosis not present

## 2018-04-15 DIAGNOSIS — D631 Anemia in chronic kidney disease: Secondary | ICD-10-CM | POA: Diagnosis not present

## 2018-04-15 DIAGNOSIS — E1129 Type 2 diabetes mellitus with other diabetic kidney complication: Secondary | ICD-10-CM | POA: Diagnosis not present

## 2018-04-15 NOTE — Progress Notes (Addendum)
OLINA, MELFI ([id number]) Visit Report for 04/14/2018 Arrival Information Details Patient Name: [name], [name]. Date of Service: 04/14/2018 3:15 PM Medical Record Number: [id number] Patient Account Number: [id number] Date of Birth/Sex: [birth year] (72 y.o. F) Treating RN: Secundino Ginger Primary Care [name]: [name] Other Clinician: Referring [name]: [name] Treating [name]/Extender: [name] in Treatment: 5 Visit Information History Since Last Visit Added or deleted any medications: No Patient Arrived: Wheel Chair Any new allergies or adverse reactions: No Arrival Time: 15:11 Had a fall or experienced change in No Accompanied By: husband activities of daily living that may affect Transfer Assistance: EasyPivot Patient risk of falls: Lift Signs or symptoms of abuse/neglect since last visito No Patient Identification Verified: Yes Hospitalized since last visit: No Secondary Verification Process Yes Implantable device outside of the clinic excluding No Completed: cellular tissue based products placed in the center Patient Requires Transmission-Based No since last visit: Precautions: Has Dressing in Place as Prescribed: Yes Patient Has Alerts: Yes Pain Present Now: No Patient Alerts: Patient on Blood Thinner Eliquis DM II Electronic Signature(s) Signed: 04/14/2018 4:26:46 PM By: Secundino Ginger Entered By: Secundino Ginger on 04/14/2018 15:19:23 [name] ([id number]) -------------------------------------------------------------------------------- Encounter Discharge Information Details Patient Name: [name]. Date of Service: 04/14/2018 3:15 PM Medical Record Number: [id number] Patient Account Number: [id number] Date of Birth/Sex: [birth year] (72 y.o. F) Treating RN: [name] Primary Care Raheem Kolbe: [name] Other Clinician: Referring [name]: [name] Treating Karter Little/Extender: [name] in  Treatment: 5 Encounter Discharge Information Items Discharge Condition: Stable Ambulatory Status: Wheelchair Discharge Destination: Home Transportation: Private Auto Accompanied By: husband Schedule Follow-up Appointment: Yes Clinical Summary of Care: Electronic Signature(s) Signed: 04/14/2018 5:29:35 PM By: [name] Entered By: [name] on 04/14/2018 16:59:59 [name] ([id number]) -------------------------------------------------------------------------------- Lower Extremity Assessment Details Patient Name: [name]. Date of Service: 04/14/2018 3:15 PM Medical Record Number: [id number] Patient Account Number: [id number] Date of Birth/Sex: 04-22-1946 (72 y.o. F) Treating RN: Secundino Ginger Primary Care Katurah Karapetian: [name] Other Clinician: Referring [name]: [name] Treating Anela Bensman/Extender: [name] in Treatment: 5 Edema Assessment Assessed: [Left: No] [Right: No] [Left: Edema] [Right: :] Calf Left: Right: Point of Measurement: 28 cm From Medial Instep 28.5 cm 29 cm Ankle Left: Right: Point of Measurement: 12 cm From Medial Instep 19.5 cm 21 cm Vascular Assessment Pulses: Dorsalis Pedis Palpable: [Left:Yes] [Right:Yes] Posterior Tibial Extremity colors, hair growth, and conditions: Extremity Color: [Left:Hyperpigmented] [Right:Hyperpigmented] Temperature of Extremity: [Left:Cool] [Right:Cool] Capillary Refill: [Left:< 3 seconds] [Right:< 3 seconds] Toe Nail Assessment Left: Right: Thick: Yes Yes Discolored: Yes Yes Deformed: Yes Yes Improper Length and Hygiene: Yes Yes Electronic Signature(s) Signed: 04/14/2018 4:26:46 PM By: Secundino Ginger Entered By: Secundino Ginger on 04/14/2018 16:10:01 [name] ([id number]) -------------------------------------------------------------------------------- Multi Wound Chart Details Patient Name: [name]. Date of Service: 04/14/2018 3:15 PM Medical Record  Number: [id number] Patient Account Number: [id number] Date of Birth/Sex: 10-18-1945 (72 y.o. 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 Lower  Extremity Lower Extremity Comorbid History: Cataracts, Anemia, Chronic Cataracts, Anemia, Chronic Cataracts, Anemia, Chronic Obstructive Pulmonary Obstructive Pulmonary Obstructive Pulmonary Disease (COPD), Arrhythmia, Disease (COPD), Arrhythmia, Disease (COPD), Arrhythmia, Hypertension, Type II Hypertension, Type II Hypertension, Type II Diabetes, End Stage Renal Diabetes, End Stage Renal Diabetes, End Stage Renal Disease, Gout, Rheumatoid Disease, Gout, Rheumatoid Disease, Gout, Rheumatoid Arthritis Arthritis Arthritis Date Acquired: 06/29/2017 06/29/2017 06/29/2017 Weeks of Treatment: 5 5 5  Wound Status: Open Open Open Measurements L x W x D 13x6x0.3 12.5x9x0.5 1.5x1.5x0.1 (cm) Area (cm) : 61.261 88.357 1.767 Volume (cm) : 18.378 44.179 0.177 % Reduction in Area: -76.60% 23.00% -104.50% % Reduction in Volume: -164.90% -28.40% -105.80% Classification: Full Thickness Without Full Thickness With Exposed Full Thickness Without Exposed Support Structures Support Structures Exposed Support Structures Exudate Amount: N/A Large None Present Exudate Type: N/A Serosanguineous  N/A Exudate Color: N/A red, brown N/A Foul Odor After Cleansing: No No No Odor Anticipated Due to N/A N/A N/A Product Use: Wound Margin: N/A Distinct, outline attached Distinct, outline attached Granulation Amount: Small (1-33%) Large (67-100%) None Present (0%) Little, Dana F. ([id number]) Granulation Quality: Red, Pink Red N/A Necrotic Amount: Large (67-100%) Small (1-33%) Large (67-100%) Necrotic Tissue: Eschar, Adherent Slough Eschar, Adherent Slough N/A Epithelialization: N/A None None Debridement: N/A N/A Debridement - Excisional Pre-procedure N/A N/A 16:26 Verification/Time Out Taken: Pain Control: N/A N/A Other Tissue Debrided: N/A N/A Callus, Subcutaneous, Slough Level: N/A N/A Skin/Subcutaneous Tissue Debridement Area (sq cm): N/A N/A 2.25 Instrument: N/A N/A Curette Bleeding: N/A N/A Moderate Hemostasis Achieved: N/A N/A Pressure Procedural Pain: N/A N/A 0 Post Procedural Pain: N/A N/A 0 Debridement Treatment N/A N/A Procedure was tolerated well Response: Post Debridement N/A N/A 1.5x1.5x0.3 Measurements L x W x D (cm) Post Debridement Volume: N/A N/A 0.53 (cm) Periwound Skin Texture: Scarring: Yes No Abnormalities Noted No Abnormalities Noted Periwound Skin Moisture: No Abnormalities Noted No Abnormalities Noted No Abnormalities Noted Periwound Skin Color: Hemosiderin Staining: Yes No Abnormalities Noted No Abnormalities Noted Temperature: N/A No Abnormality No Abnormality Tenderness on Palpation: No Yes Yes Wound Preparation: Ulcer Cleansing: Ulcer Cleansing: Ulcer Cleansing: Rinsed/Irrigated with Saline Rinsed/Irrigated with Saline Rinsed/Irrigated with Saline Topical Anesthetic Applied: Topical Anesthetic Applied: Topical Anesthetic Applied: Other: lidocaine 4% Other: lidocaine 4% Other: lidocaine 4% Procedures Performed: N/A N/A Debridement Wound Number: [id number]  Photos: Wound Location: Left Metatarsal head first - Left Toe Third Left Toe - Web  between 1st Dorsal and 2nd Wounding Event: Pressure Injury Pressure Injury Gradually Appeared Primary Etiology: Diabetic Wound/Ulcer of the Diabetic Wound/Ulcer of the Diabetic Wound/Ulcer of the Lower Extremity Lower Extremity Lower Extremity Secondary Etiology: N/A N/A N/A Comorbid History: Cataracts, Anemia, Chronic Cataracts, Anemia, Chronic Cataracts, Anemia, Chronic Obstructive Pulmonary Obstructive Pulmonary Obstructive Pulmonary Disease (COPD), Arrhythmia, Disease (COPD), Arrhythmia, Disease (COPD), Arrhythmia, Hypertension, Type II Hypertension, Type II Hypertension, Type II Diabetes, End Stage Renal Diabetes, End Stage Renal Diabetes, End Stage Renal Disease, Gout, Rheumatoid Disease, Gout, Rheumatoid Disease, Gout, Rheumatoid Arthritis Arthritis Arthritis Date Acquired: 04/05/2018 04/05/2018 04/14/2018 Weeks of Treatment: 1 1 0 [name], Dana F. ([id number]) Wound Status: Open Open Open Measurements L x W x D 0.1x0.1x0.1 0.1x0.1x0.1 3x4.5x0.1 (cm) Area (cm) : 0.008 0.008 10.603 Volume (cm) : 0.001 0.001 1.06 % Reduction in Area: 99.50% 99.80% N/A % Reduction in Volume: 99.40% 99.70% N/A Classification: Grade 1 Grade 1 Grade 1 Exudate Amount: Small None Present Large Exudate Type: Serosanguineous N/A Purulent Exudate Color: red, brown N/A yellow, brown, green Foul Odor After Cleansing: Yes Yes No Odor  Anticipated Due to No No N/A Product Use: Wound Margin: Distinct, outline attached Distinct, outline attached Flat and Intact Granulation Amount: Small (1-33%) None Present (0%) None Present (0%) Granulation Quality: Pink N/A N/A Necrotic Amount: Large (67-100%) Large (67-100%) Large (67-100%) Necrotic Tissue: Eschar, Adherent [place] Exposed Structures: Fat Layer (Subcutaneous Fascia: No Fascia: No Tissue) Exposed: Yes Fat Layer (Subcutaneous Fat Layer (Subcutaneous Fascia: No Tissue) Exposed: No Tissue) Exposed: No Tendon: No Tendon:  No Tendon: No Muscle: No Muscle: No Muscle: No Joint: No Joint: No Joint: No Bone: No Bone: No Bone: No Epithelialization: None None None Debridement: N/A N/A N/A Pain Control: N/A N/A N/A Tissue Debrided: N/A N/A N/A Level: N/A N/A N/A Debridement Area (sq cm): N/A N/A N/A Instrument: N/A N/A N/A Bleeding: N/A N/A N/A Hemostasis Achieved: N/A N/A N/A Procedural Pain: N/A N/A N/A Post Procedural Pain: N/A N/A N/A Debridement Treatment N/A N/A N/A Response: Post Debridement N/A N/A N/A Measurements L x W x D (cm) Post Debridement Volume: N/A N/A N/A (cm) Periwound Skin Texture: Excoriation: No Excoriation: No No Abnormalities Noted Induration: No Induration: No Callus: No Callus: No Crepitus: No Crepitus: No Rash: No Rash: No Scarring: No Scarring: No Periwound Skin Moisture: Maceration: No Maceration: No Maceration: Yes Dry/Scaly: No Dry/Scaly: No Periwound Skin Color: Atrophie Blanche: No Atrophie Blanche: No No Abnormalities Noted Cyanosis: No Cyanosis: No Ecchymosis: No Ecchymosis: No Erythema: No Erythema: No Hemosiderin Staining: No Hemosiderin Staining: No [name], JERDE. ([id number]) Mottled: No Mottled: No Pallor: No Pallor: No Rubor: No Rubor: No Temperature: No Abnormality No Abnormality No Abnormality Tenderness on Palpation: Yes Yes Yes Wound Preparation: Ulcer Cleansing: Ulcer Cleansing: Ulcer Cleansing: Rinsed/Irrigated with Saline Rinsed/Irrigated with Saline Rinsed/Irrigated with Saline Topical Anesthetic Applied: Topical Anesthetic Applied: Topical Anesthetic Applied: Other: lidocaine 4% Other: lidocaine 4% Other: lidocaine 4% Procedures Performed: N/A N/A N/A Wound Number: [id number]  Photos: Wound Location: Left Toe - Web between 2nd Toe Great - Dorsal Right Toe - Web between 2nd and 3rd and 3rd Wounding Event: Gradually Appeared Gradually Appeared Gradually Appeared Primary Etiology: Diabetic Wound/Ulcer of the  Diabetic Wound/Ulcer of the Diabetic Wound/Ulcer of the Lower Extremity Lower Extremity Lower Extremity Secondary Etiology: N/A N/A N/A Comorbid History: Cataracts, Anemia, Chronic Cataracts, Anemia, Chronic Cataracts, Anemia, Chronic Obstructive Pulmonary Obstructive Pulmonary Obstructive Pulmonary Disease (COPD), Arrhythmia, Disease (COPD), Arrhythmia, Disease (COPD), Arrhythmia, Hypertension, Type II Hypertension, Type II Hypertension, Type II Diabetes, End Stage Renal Diabetes, End Stage Renal Diabetes, End Stage Renal Disease, Gout, Rheumatoid Disease, Gout, Rheumatoid Disease, Gout, Rheumatoid Arthritis Arthritis Arthritis Date Acquired: 04/14/2018 04/14/2018 04/14/2018 Weeks of Treatment: 0 0 0 Wound Status: Open Open Open Measurements L x W x D 1.5x3.5x0.1 1.2x0.8x0.1 1.5x3.5x0.1 (cm) Area (cm) : 4.123 0.754 4.123 Volume (cm) : 0.412 0.075 0.412 % Reduction in Area: N/A N/A N/A % Reduction in Volume: N/A N/A N/A Classification: Grade 1 Grade 1 Grade 1 Exudate Amount: Large Medium Large Exudate Type: Purulent Purulent Purulent Exudate Color: yellow, brown, green yellow, brown, green yellow, brown, green Foul Odor After Cleansing: No No No Odor Anticipated Due to N/A N/A N/A Product Use: Wound Margin: Flat and Intact Distinct, outline attached Distinct, outline attached Granulation Amount: None Present (0%) None Present (0%) None Present (0%) Granulation Quality: N/A N/A N/A Necrotic Amount: Large (67-100%) Large (67-100%) Large (67-100%) Necrotic Tissue: Adherent Converse, Polo Exposed Structures: Fascia: No Fascia: No Fascia: No Fat Layer (Subcutaneous Fat Layer (Subcutaneous Fat Layer (Subcutaneous Tissue)  Exposed: No Tissue) Exposed: No Tissue) Exposed: No Tendon: No Tendon: No Tendon: No [name], Dana F. ([id number]) Muscle: No Muscle: No Muscle: No Joint: No Joint: No Joint: No Bone: No Bone: No Bone:  No Epithelialization: None None None Debridement: N/A N/A N/A Pain Control: N/A N/A N/A Tissue Debrided: N/A N/A N/A Level: N/A N/A N/A Debridement Area (sq cm): N/A N/A N/A Instrument: N/A N/A N/A Bleeding: N/A N/A N/A Hemostasis Achieved: N/A N/A N/A Procedural Pain: N/A N/A N/A Post Procedural Pain: N/A N/A N/A Debridement Treatment N/A N/A N/A Response: Post Debridement N/A N/A N/A Measurements L x W x D (cm) Post Debridement Volume: N/A N/A N/A (cm) Periwound Skin Texture: No Abnormalities Noted No Abnormalities Noted No Abnormalities Noted Periwound Skin Moisture: Maceration: Yes No Abnormalities Noted Maceration: Yes Periwound Skin Color: No Abnormalities Noted No Abnormalities Noted No Abnormalities Noted Temperature: No Abnormality No Abnormality No Abnormality Tenderness on Palpation: Yes Yes Yes Wound Preparation: Ulcer Cleansing: Ulcer Cleansing: Ulcer Cleansing: Rinsed/Irrigated with Saline Rinsed/Irrigated with Saline Rinsed/Irrigated with Saline Topical Anesthetic Applied: Topical Anesthetic Applied: Topical Anesthetic Applied: Other: lidocaine 4% Other: lidocaine 4% Other: lidocaine 4% Procedures Performed: N/A N/A N/A Treatment Notes Wound #1 (Right, Anterior Lower Leg) 1. Cleansed with: Cleanse wound with antibacterial soap and water 2. Anesthetic Topical Lidocaine 4% cream to wound bed prior to debridement 4. Dressing Applied: Prisma Ag Santyl Ointment 5. Secondary Dressing Applied ABD Pad Notes santyl to black areas and prisma to red areas, kerlix and coban wrap Wound #2 (Right, Posterior Lower Leg) 1. Cleansed with: Cleanse wound with antibacterial soap and water 2. Anesthetic Topical Lidocaine 4% cream to wound bed prior to debridement 4. Dressing Applied: [name], Dana F. ([id number]) Santyl Ointment 5. Secondary Dressing Applied ABD Pad Notes santyl to black areas and prisma to red areas, kerlix and coban wrap Wound  #3 (Right, Lateral Calcaneus) 1. Cleansed with: Cleanse wound with antibacterial soap and water 2. Anesthetic Topical Lidocaine 4% cream to wound bed prior to debridement 4. Dressing Applied: Prisma Ag Santyl Ointment 5. Secondary Dressing Applied ABD Pad Notes santyl to black areas and prisma to red areas, kerlix and coban wrap Wound #4 (Left, Dorsal Metatarsal head first) 1. Cleansed with: Clean wound with Normal Saline 2. Anesthetic Topical Lidocaine 4% cream to wound bed prior to debridement 4. Dressing Applied: Calcium Alginate with Silver Notes silvercell, conform wrap Wound #5 (Left Toe Third) 1. Cleansed with: Clean wound with Normal Saline 2. Anesthetic Topical Lidocaine 4% cream to wound bed prior to debridement 4. Dressing Applied: Calcium Alginate with Silver Notes silvercell, conform wrap Wound #6 (Left Toe - Web between 2nd and 3rd) 1. Cleansed with: Clean wound with Normal Saline 2. Anesthetic Topical Lidocaine 4% cream to wound bed prior to debridement 4. Dressing Applied: Calcium Alginate with Silver Notes silvercell, conform wrap [name], Dana F. ([id number]) Wound #7 (Left Toe - Web between 3rd and 4th) 1. Cleansed with: Clean wound with Normal Saline 2. Anesthetic Topical Lidocaine 4% cream to wound bed prior to debridement 4. Dressing Applied: Calcium Alginate with Silver Notes silvercell, conform wrap Wound #8 (Dorsal Toe Great) 1. Cleansed with: Clean wound with Normal Saline 2. Anesthetic Topical Lidocaine 4% cream to wound bed prior to debridement 4. Dressing Applied: Calcium Alginate with Silver Notes silvercell, conform wrap Wound #9 (Right Toe - Web between 2nd and 3rd) 1. Cleansed with: Clean wound with Normal Saline 2. Anesthetic Topical Lidocaine 4% cream to wound bed prior to debridement 4.  Dressing Applied: Calcium Alginate with Silver Notes silvercell, conform wrap Electronic Signature(s) Signed: 04/16/2018  7:56:50 AM By: Linton Ham MD Previous Signature: 04/14/2018 4:58:57 PM Version By: [name] Entered By: Linton Ham on 04/15/2018 07:49:22 [name] ([id number]) -------------------------------------------------------------------------------- Geronimo Plan Details Patient Name: [name], [name]. Date of Service: 04/14/2018 3:15 PM Medical Record Number: [id number] Patient Account Number: [id number] Date of Birth/Sex: [birth year] (72 y.o. F) Treating RN: [name] Primary Care Neveen Daponte: [name] Other Clinician: Referring Dae Antonucci: [name] Treating Laiana Fratus/Extender: [name] in Treatment: 5 Active Inactive ` Necrotic Tissue Nursing Diagnoses: Impaired tissue integrity related to necrotic/devitalized tissue Goals: Necrotic/devitalized tissue will be minimized in the wound bed Date Initiated: 03/10/2018 Target Resolution Date: 05/10/2018 Goal Status: Active Patient/caregiver will verbalize understanding of reason and process for debridement of necrotic tissue Date Initiated: 03/10/2018 Target Resolution Date: 03/25/2018 Goal Status: Active Interventions: Assess patient pain level pre-, during and post procedure and prior to discharge Treatment Activities: Apply topical anesthetic as ordered : 03/10/2018 Notes: ` Orientation to the Wound Care Program Nursing Diagnoses: Knowledge deficit related to the wound healing center program Goals: Patient/caregiver will verbalize understanding of the [facility] Date Initiated: 03/10/2018 Target Resolution Date: 04/02/2018 Goal Status: Active Interventions: Provide education on orientation to the wound center Notes: ` Soft Tissue Infection Nursing Diagnoses: Impaired tissue integrity Potential for infection: soft tissue JERITA, WIMBUSH ([id number]) Goals: Patient will remain free of wound infection Date Initiated: 03/10/2018 Target  Resolution Date: 04/02/2018 Goal Status: Active Interventions: Assess signs and symptoms of infection every visit Notes: ` Wound/Skin Impairment Nursing Diagnoses: Impaired tissue integrity Goals: Ulcer/skin breakdown will have a volume reduction of 80% by week 12 Date Initiated: 03/10/2018 Target Resolution Date: 06/10/2018 Goal Status: Active Interventions: Assess ulceration(s) every visit Treatment Activities: Skin care regimen initiated : 03/10/2018 Topical wound management initiated : 03/10/2018 Notes: Electronic Signature(s) Signed: 04/14/2018 4:58:57 PM By: [name] Entered By: [name] on 04/14/2018 16:16:53 [name] ([id number]) -------------------------------------------------------------------------------- Pain Assessment Details Patient Name: [name]. Date of Service: 04/14/2018 3:15 PM Medical Record Number: [id number] Patient Account Number: [id number] Date of Birth/Sex: 1946-02-27 (72 y.o. F) Treating RN: Secundino Ginger Primary Care Alanys Godino: [name] Other Clinician: Referring Aiyanah Kalama: [name] Treating Xcaret Morad/Extender: [name] in Treatment: 5 Active Problems Location of Pain Severity and Description of Pain Patient Has Paino No Site Locations Pain Management and Medication Current Pain Management: Goals for Pain Management Topical or injectable lidocaine is offered to patient for acute pain when surgical debridement is performed. If needed, Patient is instructed to use over the counter pain medication for the following 24-48 hours after debridement. Wound care MDs do not prescribed pain medications. Patient has chronic pain or uncontrolled pain. Patient has been instructed to make an appointment with their Primary Care Physician for pain management. Electronic Signature(s) Signed: 04/14/2018 4:26:46 PM By: Secundino Ginger Entered By: Secundino Ginger on 04/14/2018 15:19:37 [name]  ([id number]) -------------------------------------------------------------------------------- Patient/Caregiver Education Details Patient Name: [name]. Date of Service: 04/14/2018 3:15 PM Medical Record Number: [id number] Patient Account Number: [id number] Date of Birth/Gender: [birth year] (72 y.o. F) Treating RN: [name] Primary Care Physician: [name] Other Clinician: Referring Physician: Sharilyn Little Treating Physician/Extender: [name] in Treatment: 5 Education Assessment Education Provided To: Patient and Caregiver Education Topics Provided Wound/Skin Impairment: Handouts: Other: wound care to toes Methods: Demonstration, Explain/Verbal Responses: State content correctly Electronic Signature(s) Signed: 04/14/2018 5:29:35 PM By: Marjory Lies,  Di Kindle Entered By: [name] on 04/14/2018 17:00:24 [name] ([id number]) -------------------------------------------------------------------------------- Wound Assessment Details Patient Name: [name], [name]. Date of Service: 04/14/2018 3:15 PM Medical Record Number: [id number] Patient Account Number: [id number] Date of Birth/Sex: 1946/05/08 (72 y.o. F) Treating RN: Secundino Ginger Primary Care Jigar Zielke: [name] Other Clinician: Referring Marasia Newhall: [name] Treating Pleasant Bensinger/Extender: [name] in Treatment: 5 Wound Status Wound Number: 1 Primary Calciphylaxis Etiology: Wound Location: Right Lower Leg - Anterior Secondary Diabetic Wound/Ulcer of the Lower Extremity Wounding Event: Gradually Appeared Etiology: Date Acquired: 06/29/2017 Wound Open Weeks Of Treatment: 5 Status: Clustered Wound: No Comorbid Cataracts, Anemia, Chronic Obstructive History: Pulmonary Disease (COPD), Arrhythmia, Hypertension, Type II Diabetes, End Stage Renal Disease, Gout, Rheumatoid Arthritis Photos Photo Uploaded By: Secundino Ginger on 04/14/2018 16:19:45 Wound  Measurements Length: (cm) 13 Width: (cm) 6 Depth: (cm) 0.3 Area: (cm) 61.261 Volume: (cm) 18.378 % Reduction in Area: -76.6% % Reduction in Volume: -164.9% Tunneling: No Undermining: No Wound Description Full Thickness Without Exposed Support Classification: Structures Foul Odor After Cleansing: No Slough/Fibrino Yes Wound Bed Granulation Amount: Small (1-33%) Granulation Quality: Red, Pink Necrotic Amount: Large (67-100%) Necrotic Quality: Eschar, Adherent Slough Periwound Skin Texture Texture Color No Abnormalities Noted: No No Abnormalities Noted: No Scarring: Yes Hemosiderin Staining: Yes Moisture No Abnormalities Noted: No [name], Dana F. ([id number]) Wound Preparation Ulcer Cleansing: Rinsed/Irrigated with Saline Topical Anesthetic Applied: Other: lidocaine 4%, Treatment Notes Wound #1 (Right, Anterior Lower Leg) 1. Cleansed with: Cleanse wound with antibacterial soap and water 2. Anesthetic Topical Lidocaine 4% cream to wound bed prior to debridement 4. Dressing Applied: Prisma Ag Santyl Ointment 5. Secondary Dressing Applied ABD Pad Notes santyl to black areas and prisma to red areas, kerlix and coban wrap Electronic Signature(s) Signed: 04/14/2018 4:26:46 PM By: Secundino Ginger Entered By: Secundino Ginger on 04/14/2018 15:45:26 [name] ([id number]) -------------------------------------------------------------------------------- Wound Assessment Details Patient Name: [name]. Date of Service: 04/14/2018 3:15 PM Medical Record Number: [id number] Patient Account Number: [id number] Date of Birth/Sex: Mar 24, 1946 (72 y.o. 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 Wounding Event: Gradually Appeared Etiology: Date Acquired: 06/29/2017 Wound Open Weeks Of Treatment: 5 Status: Clustered Wound: No Comorbid Cataracts, Anemia, Chronic Obstructive History: Pulmonary Disease (COPD), Arrhythmia, Hypertension, Type II Diabetes, End Stage Renal Disease, Gout, Rheumatoid Arthritis Photos Photo Uploaded By: Secundino Ginger on 04/14/2018 16:19:46 Wound Measurements Length: (cm) 12.5 Width: (cm) 9 Depth: (cm) 0.5 Area: (cm) 88.357 Volume: (cm) 44.179 % Reduction in Area: 23% % Reduction in Volume: -28.4% Epithelialization: None Tunneling: No Undermining: No Wound Description Full Thickness With Exposed Support Foul Odor Classification: Structures Slough/Fib Wound Margin: Distinct, outline attached Exudate Large Amount: Exudate Type: Serosanguineous Exudate Color: red, brown After Cleansing: No rino Yes Wound Bed Granulation Amount: Large (67-100%) Exposed Structure Granulation Quality: Red Fascia Exposed: No Necrotic Amount: Small (1-33%) Fat Layer (Subcutaneous Tissue) Exposed: No Necrotic Quality: Eschar, Adherent Slough Tendon Exposed: No Muscle Exposed: No Joint Exposed: No Bone Exposed: No Briseida, Gittings Mylinh F. ([id number]) Periwound Skin Texture Texture Color No Abnormalities Noted: No No Abnormalities Noted: No Moisture Temperature / Pain No Abnormalities Noted: No Temperature: No Abnormality Tenderness on Palpation: Yes Wound Preparation Ulcer Cleansing: Rinsed/Irrigated with Saline Topical Anesthetic Applied: Other: lidocaine 4%, Treatment Notes Wound #2 (  Right, Posterior Lower Leg) 1. Cleansed with: Cleanse wound with antibacterial soap and water 2. Anesthetic Topical Lidocaine 4% cream to wound bed prior to debridement 4. Dressing Applied: Prisma Ag Santyl Ointment 5. Secondary Dressing Applied ABD Pad Notes santyl to black areas and prisma to red areas, kerlix and coban  wrap Electronic Signature(s) Signed: 04/14/2018 4:26:46 PM By: Secundino Ginger Entered By: Secundino Ginger on 04/14/2018 15:48:37 Dana Huger F. ([id number]) -------------------------------------------------------------------------------- Wound Assessment Details Patient Name: [name]. Date of Service: 04/14/2018 3:15 PM Medical Record Number: [id number] Patient Account Number: [id number] Date of Birth/Sex: [birth year] (72 y.o. 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 Wounding Event: Gradually Appeared Etiology: Date Acquired: 06/29/2017 Wound Open Weeks Of Treatment: 5 Status: Clustered Wound: No Comorbid Cataracts, Anemia, Chronic Obstructive History: Pulmonary Disease (COPD), Arrhythmia, Hypertension, Type II Diabetes, End Stage Renal Disease, Gout, Rheumatoid Arthritis Photos Photo Uploaded By: Secundino Ginger on 04/14/2018 16:21:18 Wound Measurements Length: (cm) 1.5 Width: (cm) 1.5 Depth: (cm) 0.1 Area: (cm) 1.767 Volume: (cm) 0.177 % Reduction in Area: -104.5% % Reduction in Volume: -105.8% Epithelialization: None Tunneling: No Undermining: No Wound Description Full Thickness Without Exposed Support Foul Odor Classification: Structures Slough/Fi Wound Margin: Distinct, outline attached Exudate None Present Amount: After Cleansing: No brino Yes Wound Bed Granulation Amount: None Present (0%) Exposed Structure Necrotic Amount: Large (67-100%) Fascia Exposed: No Fat Layer (Subcutaneous Tissue) Exposed: No Tendon Exposed: No Muscle Exposed: No Joint Exposed: No Bone Exposed: No Periwound Skin Texture Stagliano, Cedricka F. ([id number]) Texture Color No  Abnormalities Noted: No No Abnormalities Noted: No Moisture Temperature / Pain No Abnormalities Noted: No Temperature: No Abnormality Tenderness on Palpation: Yes Wound Preparation Ulcer Cleansing: Rinsed/Irrigated with Saline Topical Anesthetic Applied: Other: lidocaine 4%, Treatment Notes Wound #3 (Right, Lateral Calcaneus) 1. Cleansed with: Cleanse wound with antibacterial soap and water 2. Anesthetic Topical Lidocaine 4% cream to wound bed prior to debridement 4. Dressing Applied: Prisma Ag Santyl Ointment 5. Secondary Dressing Applied ABD Pad Notes santyl to black areas and prisma to red areas, kerlix and coban wrap Electronic Signature(s) Signed: 04/14/2018 4:26:46 PM By: Secundino Ginger Entered By: Secundino Ginger on 04/14/2018 15:49:35 Dana Huger F. ([id number]) -------------------------------------------------------------------------------- Wound Assessment Details Patient Name: [name]. Date of Service: 04/14/2018 3:15 PM Medical Record Number: [id number] Patient Account Number: [id number] Date of Birth/Sex: 06/27/1946 (72 y.o. F) Treating RN: Secundino Ginger Primary Care Mayjor Ager: [name] Other Clinician: Referring Ankit Degregorio: [name] Treating Kale Rondeau/Extender: [name] in Treatment: 5 Wound Status Wound Number: 4 Primary Diabetic Wound/Ulcer of the Lower Extremity Etiology: Wound Location: Left Metatarsal head first - Dorsal Wound Open Wounding Event: Pressure Injury Status: Date Acquired: 04/05/2018 Comorbid Cataracts, Anemia, Chronic Obstructive Weeks Of Treatment: 1 History: Pulmonary Disease (COPD), Arrhythmia, Clustered Wound: No Hypertension, Type II Diabetes, End Stage Renal Disease, Gout, Rheumatoid Arthritis Photos Photo Uploaded By: Secundino Ginger on 04/14/2018 16:21:18 Wound Measurements Length: (cm) 0.1 Width: (cm) 0.1 Depth: (cm) 0.1 Area: (cm) 0.008 Volume: (cm) 0.001 % Reduction in Area: 99.5% % Reduction in  Volume: 99.4% Epithelialization: None Tunneling: No Undermining: No Wound Description Classification: Grade 1 Wound Margin: Distinct, outline attached Exudate Amount: Small Exudate Type: Serosanguineous Exudate Color: red, brown Foul Odor After Cleansing: Yes Due to Product Use: No Slough/Fibrino Yes  Wound Bed Granulation Amount: Small (1-33%) Exposed Structure Granulation Quality: Pink Fascia Exposed: No Necrotic Amount: Large (67-100%) Fat Layer (Subcutaneous Tissue) Exposed: Yes Necrotic Quality: Eschar, Adherent Slough Tendon Exposed: No Muscle Exposed: No Joint Exposed: No Bone Exposed: No Periwound Skin Texture Texture Color Fritsche, Ahliya F. ([id number]) No Abnormalities Noted: No No Abnormalities Noted: No Callus: No Atrophie Blanche: No Crepitus: No Cyanosis: No Excoriation: No Ecchymosis: No Induration: No Erythema: No Rash: No Hemosiderin Staining: No Scarring: No Mottled: No Pallor: No Moisture Rubor: No No Abnormalities Noted: No Dry / Scaly: No Temperature / Pain Maceration: No Temperature: No Abnormality Tenderness on Palpation: Yes Wound Preparation Ulcer Cleansing: Rinsed/Irrigated with Saline Topical Anesthetic Applied: Other: lidocaine 4%, Treatment Notes Wound #4 (Left, Dorsal Metatarsal head first) 1. Cleansed with: Clean wound with Normal Saline 2. Anesthetic Topical Lidocaine 4% cream to wound bed prior to debridement 4. Dressing Applied: Calcium Alginate with Silver Notes silvercell, conform wrap Electronic Signature(s) Signed: 04/14/2018 4:26:46 PM By: Secundino Ginger Entered By: Secundino Ginger on 04/14/2018 15:51:56 [name] ([id number]) -------------------------------------------------------------------------------- Wound Assessment Details Patient Name: [name]. Date of Service: 04/14/2018 3:15 PM Medical Record Number: [id number] Patient Account Number: [id number] Date of Birth/Sex: [birth year] (72 y.o.  F) Treating RN: Secundino Ginger Primary Care Alisse Tuite: [name] Other Clinician: Referring Ferd Horrigan: [name] Treating Meg Niemeier/Extender: [name] in Treatment: 5 Wound Status Wound Number: 5 Primary Diabetic Wound/Ulcer of the Lower Extremity Etiology: Wound Location: Left Toe Third Wound Open Wounding Event: Pressure Injury Status: Date Acquired: 04/05/2018 Comorbid Cataracts, Anemia, Chronic Obstructive Weeks Of Treatment: 1 History: Pulmonary Disease (COPD), Arrhythmia, Clustered Wound: No Hypertension, Type II Diabetes, End Stage Renal Disease, Gout, Rheumatoid Arthritis Photos Photo Uploaded By: Secundino Ginger on 04/14/2018 16:22:40 Wound Measurements Length: (cm) 0.1 Width: (cm) 0.1 Depth: (cm) 0.1 Area: (cm) 0.008 Volume: (cm) 0.001 % Reduction in Area: 99.8% % Reduction in Volume: 99.7% Epithelialization: None Tunneling: No Undermining: No Wound Description Classification: Grade 1 Wound Margin: Distinct, outline attached Exudate Amount: None Present Foul Odor After Cleansing: Yes Due to Product Use: No Slough/Fibrino Yes Wound Bed Granulation Amount: None Present (0%) Exposed Structure Necrotic Amount: Large (67-100%) Fascia Exposed: No Necrotic Quality: Eschar Fat Layer (Subcutaneous Tissue) Exposed: No Tendon Exposed: No Muscle Exposed: No Joint Exposed: No Bone Exposed: No Periwound Skin Texture Texture Color No Abnormalities Noted: No No Abnormalities Noted: No Callus: No Atrophie Blanche: No [name], SIROIS. ([id number]) Crepitus: No Cyanosis: No Excoriation: No Ecchymosis: No Induration: No Erythema: No Rash: No Hemosiderin Staining: No Scarring: No Mottled: No Pallor: No Moisture Rubor: No No Abnormalities Noted: No Dry / Scaly: No Temperature / Pain Maceration: No Temperature: No Abnormality Tenderness on Palpation: Yes Wound Preparation Ulcer Cleansing: Rinsed/Irrigated with Saline Topical Anesthetic  Applied: Other: lidocaine 4%, Treatment Notes Wound #5 (Left Toe Third) 1. Cleansed with: Clean wound with Normal Saline 2. Anesthetic Topical Lidocaine 4% cream to wound bed prior to debridement 4. Dressing Applied: Calcium Alginate with Silver Notes silvercell, conform wrap Electronic Signature(s) Signed: 04/14/2018 4:26:46 PM By: Secundino Ginger Entered By: Secundino Ginger on 04/14/2018 15:54:00 [name] ([id number]) -------------------------------------------------------------------------------- Wound Assessment Details Patient Name: [name]. Date of Service: 04/14/2018 3:15 PM Medical Record Number: [id number] Patient Account Number: [id number] Date of Birth/Sex: 25-Aug-1946 (72 y.o. F) Treating RN: Secundino Ginger Primary Care Davanta Meuser: [name] Other Clinician: Referring Aniqua Briere: [name] Treating Cheryln Balcom/Extender: [name] in Treatment: 5 Wound Status Wound Number: 6 Primary Diabetic Wound/Ulcer  of the Lower Extremity Etiology: Wound Location: Left Toe - Web between 1st and 2nd Wound Open Wounding Event: Gradually Appeared Status: Date Acquired: 04/14/2018 Comorbid Cataracts, Anemia, Chronic Obstructive Weeks Of Treatment: 0 History: Pulmonary Disease (COPD), Arrhythmia, Clustered Wound: No Hypertension, Type II Diabetes, End Stage Renal Disease, Gout, Rheumatoid Arthritis Photos Photo Uploaded By: Secundino Ginger on 04/14/2018 16:22:40 Wound Measurements Length: (cm) 3 Width: (cm) 4.5 Depth: (cm) 0.1 Area: (cm) 10.603 Volume: (cm) 1.06 % Reduction in Area: % Reduction in Volume: Epithelialization: None Tunneling: No Undermining: No Wound Description Classification: Grade 1 Wound Margin: Flat and Intact Exudate Amount: Large Exudate Type: Purulent Exudate Color: yellow, brown, green Foul Odor After Cleansing: No Slough/Fibrino Yes Wound Bed Granulation Amount: None Present (0%) Exposed Structure Necrotic Amount: Large  (67-100%) Fascia Exposed: No Necrotic Quality: Adherent Slough Fat Layer (Subcutaneous Tissue) Exposed: No Tendon Exposed: No Muscle Exposed: No Joint Exposed: No Bone Exposed: No Periwound Skin Texture Texture Color Shenee, [place] Kerri-Anne F. ([id number]) No Abnormalities Noted: No No Abnormalities Noted: No Moisture Temperature / Pain No Abnormalities Noted: No Temperature: No Abnormality Maceration: Yes Tenderness on Palpation: Yes Wound Preparation Ulcer Cleansing: Rinsed/Irrigated with Saline Topical Anesthetic Applied: Other: lidocaine 4%, Electronic Signature(s) Signed: 04/14/2018 4:26:46 PM By: Secundino Ginger Entered By: Secundino Ginger on 04/14/2018 15:57:00 [name] ([id number]) -------------------------------------------------------------------------------- Wound Assessment Details Patient Name: [name]. Date of Service: 04/14/2018 3:15 PM Medical Record Number: [id number] Patient Account Number: [id number] Date of Birth/Sex: August 21, 1946 (72 y.o. F) Treating RN: Secundino Ginger Primary Care Celina Shiley: [name] Other Clinician: Referring Cesily Cuoco: [name] Treating Mirissa Lopresti/Extender: [name] in Treatment: 5 Wound Status Wound Number: 7 Primary Diabetic Wound/Ulcer of the Lower Extremity Etiology: Wound Location: Left Toe - Web between 2nd and 3rd Wound Open Wounding Event: Gradually Appeared Status: Date Acquired: 04/14/2018 Comorbid Cataracts, Anemia, Chronic Obstructive Weeks Of Treatment: 0 History: Pulmonary Disease (COPD), Arrhythmia, Clustered Wound: No Hypertension, Type II Diabetes, End Stage Renal Disease, Gout, Rheumatoid Arthritis Photos Photo Uploaded By: Secundino Ginger on 04/14/2018 16:24:19 Wound Measurements Length: (cm) 1.5 Width: (cm) 3.5 Depth: (cm) 0.1 Area: (cm) 4.123 Volume: (cm) 0.412 % Reduction in Area: % Reduction in Volume: Epithelialization: None Tunneling: No Undermining: No Wound  Description Classification: Grade 1 Wound Margin: Flat and Intact Exudate Amount: Large Exudate Type: Purulent Exudate Color: yellow, brown, green Foul Odor After Cleansing: No Slough/Fibrino Yes Wound Bed Granulation Amount: None Present (0%) Exposed Structure Necrotic Amount: Large (67-100%) Fascia Exposed: No Necrotic Quality: Adherent Slough Fat Layer (Subcutaneous Tissue) Exposed: No Tendon Exposed: No Muscle Exposed: No Joint Exposed: No Bone Exposed: No Periwound Skin Texture Texture Color Tyneshia, Stivers Akelia F. ([id number]) No Abnormalities Noted: No No Abnormalities Noted: No Moisture Temperature / Pain No Abnormalities Noted: No Temperature: No Abnormality Maceration: Yes Tenderness on Palpation: Yes Wound Preparation Ulcer Cleansing: Rinsed/Irrigated with Saline Topical Anesthetic Applied: Other: lidocaine 4%, Electronic Signature(s) Signed: 04/14/2018 4:26:46 PM By: Secundino Ginger Entered By: Secundino Ginger on 04/14/2018 15:59:44 [name] ([id number]) -------------------------------------------------------------------------------- Wound Assessment Details Patient Name: [name]. Date of Service: 04/14/2018 3:15 PM Medical Record Number: [id number] Patient Account Number: [id number] Date of Birth/Sex: [birth year] (72 y.o. F) Treating RN: Secundino Ginger Primary Care Wilna Pennie: [name] Other Clinician: Referring Zaryah Seckel: [name] Treating Lene Mckay/Extender: [name] in Treatment: 5 Wound Status Wound Number: 8 Primary Diabetic Wound/Ulcer of the Lower Extremity Etiology: Wound Location: Toe Great - Dorsal Wound Open Wounding Event: Gradually Appeared Status: Date Acquired: 04/14/2018 Comorbid  Cataracts, Anemia, Chronic Obstructive Weeks Of Treatment: 0 History: Pulmonary Disease (COPD), Arrhythmia, Clustered Wound: No Hypertension, Type II Diabetes, End Stage Renal Disease, Gout, Rheumatoid Arthritis Photos Photo  Uploaded By: Secundino Ginger on 04/14/2018 16:24:48 Wound Measurements Length: (cm) 1.2 % Reduction Width: (cm) 0.8 % Reduction Depth: (cm) 0.1 Epitheliali Area: (cm) 0.754 Tunneling: Volume: (cm) 0.075 Underminin in Area: in Volume: zation: None No g: No Wound Description Classification: Grade 1 Foul Odor A Wound Margin: Distinct, outline attached Slough/Fibr Exudate Amount: Medium Exudate Type: Purulent Exudate Color: yellow, brown, green fter Cleansing: No ino Yes Wound Bed Granulation Amount: None Present (0%) Exposed Structure Necrotic Amount: Large (67-100%) Fascia Exposed: No Necrotic Quality: Eschar, Adherent Slough Fat Layer (Subcutaneous Tissue) Exposed: No Tendon Exposed: No Muscle Exposed: No Joint Exposed: No Bone Exposed: No Periwound Skin Texture Texture Color Shala, [place] Fraidy F. ([id number]) No Abnormalities Noted: No No Abnormalities Noted: No Moisture Temperature / Pain No Abnormalities Noted: No Temperature: No Abnormality Tenderness on Palpation: Yes Wound Preparation Ulcer Cleansing: Rinsed/Irrigated with Saline Topical Anesthetic Applied: Other: lidocaine 4%, Treatment Notes Wound #8 (Dorsal Toe Great) 1. Cleansed with: Clean wound with Normal Saline 2. Anesthetic Topical Lidocaine 4% cream to wound bed prior to debridement 4. Dressing Applied: Calcium Alginate with Silver Notes silvercell, conform wrap Electronic Signature(s) Signed: 04/14/2018 4:26:46 PM By: Secundino Ginger Entered By: Secundino Ginger on 04/14/2018 16:02:10 [name] ([id number]) -------------------------------------------------------------------------------- Wound Assessment Details Patient Name: [name]. Date of Service: 04/14/2018 3:15 PM Medical Record Number: [id number] Patient Account Number: [id number] Date of Birth/Sex: [birth year] (72 y.o. F) Treating RN: Secundino Ginger Primary Care Cing : [name] Other Clinician: Referring Marinell Igarashi: [name] Treating Dominyk Law/Extender: [name] in Treatment: 5 Wound Status Wound Number: 9 Primary Diabetic Wound/Ulcer of the Lower Extremity Etiology: Wound Location: Right Toe - Web between 2nd and 3rd Wound Open Wounding Event: Gradually Appeared Status: Date Acquired: 04/14/2018 Comorbid Cataracts, Anemia, Chronic Obstructive Weeks Of Treatment: 0 History: Pulmonary Disease (COPD), Arrhythmia, Clustered Wound: No Hypertension, Type II Diabetes, End Stage Renal Disease, Gout, Rheumatoid Arthritis Photos Photo Uploaded By: Secundino Ginger on 04/14/2018 16:25:25 Wound Measurements Length: (cm) 1.5 Width: (cm) 3.5 Depth: (cm) 0.1 Area: (cm) 4.123 Volume: (cm) 0.412 % Reduction in Area: % Reduction in Volume: Epithelialization: None Tunneling: No Undermining: No Wound Description Classification: Grade 1 Wound Margin: Distinct, outline attached Exudate Amount: Large Exudate Type: Purulent Exudate Color: yellow, brown, green Foul Odor After Cleansing: No Slough/Fibrino Yes Wound Bed Granulation Amount: None Present (0%) Exposed Structure Necrotic Amount: Large (67-100%) Fascia Exposed: No Necrotic Quality: Adherent Slough Fat Layer (Subcutaneous Tissue) Exposed: No Tendon Exposed: No Muscle Exposed: No Joint Exposed: No Bone Exposed: No Periwound Skin Texture Texture Color Takeya, Marquis Tiearra F. ([id number]) No Abnormalities Noted: No No Abnormalities Noted: No Moisture Temperature / Pain No Abnormalities Noted: No Temperature: No Abnormality Maceration: Yes Tenderness on Palpation: Yes Wound Preparation Ulcer Cleansing: Rinsed/Irrigated with Saline Topical Anesthetic Applied: Other: lidocaine 4%, Treatment Notes Wound #9 (Right Toe - Web between 2nd and 3rd) 1. Cleansed with: Clean wound with Normal Saline 2. Anesthetic Topical Lidocaine 4% cream to wound bed prior to debridement 4. Dressing Applied: Calcium Alginate with  Silver Notes silvercell, conform wrap Electronic Signature(s) Signed: 04/14/2018 4:26:46 PM By: Secundino Ginger Entered By: Secundino Ginger on 04/14/2018 16:05:20 [name] ([id number]) -------------------------------------------------------------------------------- Vitals Details Patient Name: [name]. Date of Service: 04/14/2018 3:15 PM Medical Record Number: [id number] Patient Account Number: [id number] Date of Birth/Sex:  [birth year] (72 y.o. F) Treating RN: Secundino Ginger Primary Care Leeba Barbe: [name] Other Clinician: Referring Candita Borenstein: [name] Treating Meiah Zamudio/Extender: [name] in Treatment: 5 Vital Signs Time Taken: 15:20 Temperature (F): 98.1 Height (in): 65 Pulse (bpm): 68 Weight (lbs): 164 Respiratory Rate (breaths/min): 18 Body Mass Index (BMI): 27.3 Blood Pressure (mmHg): 99/53 Reference Range: 80 - 120 mg / dl Electronic Signature(s) Signed: 04/14/2018 4:26:46 PM By: Secundino Ginger Entered BySecundino Ginger on 04/14/2018 15:20:18

## 2018-04-16 DIAGNOSIS — N189 Chronic kidney disease, unspecified: Secondary | ICD-10-CM | POA: Diagnosis not present

## 2018-04-16 DIAGNOSIS — I872 Venous insufficiency (chronic) (peripheral): Secondary | ICD-10-CM | POA: Diagnosis not present

## 2018-04-16 DIAGNOSIS — D649 Anemia, unspecified: Secondary | ICD-10-CM | POA: Diagnosis not present

## 2018-04-16 DIAGNOSIS — H409 Unspecified glaucoma: Secondary | ICD-10-CM | POA: Diagnosis not present

## 2018-04-16 DIAGNOSIS — Z794 Long term (current) use of insulin: Secondary | ICD-10-CM | POA: Diagnosis not present

## 2018-04-16 DIAGNOSIS — I4891 Unspecified atrial fibrillation: Secondary | ICD-10-CM | POA: Diagnosis not present

## 2018-04-16 DIAGNOSIS — M159 Polyosteoarthritis, unspecified: Secondary | ICD-10-CM | POA: Diagnosis not present

## 2018-04-16 DIAGNOSIS — N186 End stage renal disease: Secondary | ICD-10-CM | POA: Diagnosis not present

## 2018-04-16 DIAGNOSIS — Z7952 Long term (current) use of systemic steroids: Secondary | ICD-10-CM | POA: Diagnosis not present

## 2018-04-16 DIAGNOSIS — Z992 Dependence on renal dialysis: Secondary | ICD-10-CM | POA: Diagnosis not present

## 2018-04-16 DIAGNOSIS — M329 Systemic lupus erythematosus, unspecified: Secondary | ICD-10-CM | POA: Diagnosis not present

## 2018-04-16 DIAGNOSIS — J449 Chronic obstructive pulmonary disease, unspecified: Secondary | ICD-10-CM | POA: Diagnosis not present

## 2018-04-16 DIAGNOSIS — E785 Hyperlipidemia, unspecified: Secondary | ICD-10-CM | POA: Diagnosis not present

## 2018-04-16 DIAGNOSIS — M109 Gout, unspecified: Secondary | ICD-10-CM | POA: Diagnosis not present

## 2018-04-16 DIAGNOSIS — E1122 Type 2 diabetes mellitus with diabetic chronic kidney disease: Secondary | ICD-10-CM | POA: Diagnosis not present

## 2018-04-16 DIAGNOSIS — M150 Primary generalized (osteo)arthritis: Secondary | ICD-10-CM | POA: Diagnosis not present

## 2018-04-16 DIAGNOSIS — Z87891 Personal history of nicotine dependence: Secondary | ICD-10-CM | POA: Diagnosis not present

## 2018-04-17 DIAGNOSIS — D689 Coagulation defect, unspecified: Secondary | ICD-10-CM | POA: Diagnosis not present

## 2018-04-17 DIAGNOSIS — E1129 Type 2 diabetes mellitus with other diabetic kidney complication: Secondary | ICD-10-CM | POA: Diagnosis not present

## 2018-04-17 DIAGNOSIS — N186 End stage renal disease: Secondary | ICD-10-CM | POA: Diagnosis not present

## 2018-04-17 DIAGNOSIS — N2581 Secondary hyperparathyroidism of renal origin: Secondary | ICD-10-CM | POA: Diagnosis not present

## 2018-04-17 DIAGNOSIS — D509 Iron deficiency anemia, unspecified: Secondary | ICD-10-CM | POA: Diagnosis not present

## 2018-04-17 DIAGNOSIS — E876 Hypokalemia: Secondary | ICD-10-CM | POA: Diagnosis not present

## 2018-04-17 DIAGNOSIS — D631 Anemia in chronic kidney disease: Secondary | ICD-10-CM | POA: Diagnosis not present

## 2018-04-17 DIAGNOSIS — E039 Hypothyroidism, unspecified: Secondary | ICD-10-CM | POA: Diagnosis not present

## 2018-04-17 NOTE — Progress Notes (Signed)
JUNI, GLAAB ([id number]) Visit Report for 04/14/2018 Debridement Details Patient Name: [name], [name]. Date of Service: 04/14/2018 3:15 PM Medical Record Number: [id number] Patient Account Number: [id number] Date of Birth/Sex: 23-Apr-1946 (72 y.o. F) Treating RN: Roger Shelter Primary Care Provider: Sharilyn Sites Other Clinician: Referring Provider: Sharilyn Sites Treating Provider/Extender: Tito Dine in Treatment: 5 Debridement Performed for Wound #3 Right,Lateral Calcaneus Assessment: Performed By: Physician Ricard Dillon, MD Debridement Type: Debridement Severity of Tissue Pre Fat layer exposed Debridement: Pre-procedure Verification/Time Yes - 16:26 Out Taken: Start Time: 16:26 Pain Control: Other : lidocaine 4% Total Area Debrided (L x W): 1.5 (cm) x 1.5 (cm) = 2.25 (cm) Tissue and other material Viable, Non-Viable, Callus, Slough, Subcutaneous, Biofilm, Slough debrided: Level: Skin/Subcutaneous Tissue Debridement Description: Excisional Instrument: Curette Bleeding: Moderate Hemostasis Achieved: Pressure End Time: 16:28 Procedural Pain: 0 Post Procedural Pain: 0 Response to Treatment: Procedure was tolerated well Level of Consciousness: Awake and Alert Post Debridement Measurements of Total Wound Length: (cm) 1.5 Width: (cm) 1.5 Depth: (cm) 0.3 Volume: (cm) 0.53 Character of Wound/Ulcer Post Debridement: Stable Severity of Tissue Post Debridement: Fat layer exposed Post Procedure Diagnosis Same as Pre-procedure Electronic Signature(s) Signed: 04/14/2018 4:58:57 PM By: Roger Shelter Signed: 04/16/2018 7:56:50 AM By: Linton Ham MD Entered By: Roger Shelter on 04/14/2018 16:27:38 [name] ([id number]) -------------------------------------------------------------------------------- HPI Details Patient Name: [name]. Date of Service: 04/14/2018 3:15 PM Medical Record Number:  [id number] Patient Account Number: [id number] Date of Birth/Sex: [birth year] (72 y.o. F) Treating RN: Montey Hora Primary Care Provider: Sharilyn Sites Other Clinician: Referring Provider: Sharilyn Sites Treating Provider/Extender: Tito Dine in Treatment: 5 History of Present Illness HPI Description: ADMISSION 03/10/18; This is a 72 year old woman with a history of end-stage renal disease on hemodialysis, type 2 diabetes on insulin with excellent control, systemic lupus. Her problem began in October 2018 she developed a blister in an open wound on the right anterior tibial mid tibial area. This progressed and worsened until the tissue loss was substantial on the anterior and posterior right calf. she was followed in [facility] by physical therapy at one point although I have not reviewed their notes. She apparently did not receive pulse lavage to the wound. I note that she was hospitalized from 03/01/18 through 03/03/18 predominantly for debridement of the wound by general surgery. She saw Dr. Arnoldo Morale who did not believe there was a role for surgical debridement. Was felt that definitive treatment would be amputation but the patient already been offered this by vein and vascular in [facility] and she refused arrangements were made for her to be sent to this clinic for review. The patient is already had a reasonably substantial review for this problem. she saw vein and vascular extensively over the course of this year.her noninvasive arterial studies done in December 2018 suggested a right superficial femoral artery occlusion/stenosis. She did not have any issues on the left. At that point her ABI on the right was 0.8 with monophasic waveforms 1.06 on the left. However she had a velocity drop off in the superficial femoral artery. She went on to have diagnostic angiogram on 12/18/17 by Dr. Oneida Alar. This showed on the right that the common femoral, profunda femoris and superficial femoral  arteries were all patent. There was a focal less than 1 mm length stenosis on the right superficial femoral artery otherwise the artery was widely patent. The right popliteal artery was widely patent. There was 3 vessel runoff via the anterior, tibial and  paranoid [place] arteries to the right foot. She was not felt to have a vascular cause of these wounds. sHe's also had 2 separate biopsies. Firstly on 3/25 showed nonspecific findings with calciphylaxis not entirely excluded. No definite calcium deposits were seen within the dermis within the sections reviewed. Vascular features were nonspecific. Furthermore pyogenic granuloma may demonstrate similar findings. 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 Calciphylaxis was not favored in this specimen. There was a calcified vessel. I don't see a pathology specimen that specifically says definitive calciphylaxis. The patient had an x-ray of her right tibia while she is in hospital there was no osteomyelitis. She had an MRI of her foot in March that did not show osteomyelitis as well The patient has been using Betadine and wraps to the wounds. This is being done by a home health where they live in [place] 03/17/18; patient admitted to clinic last week. 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 Considerable amount of necrotic surface material that I removed from the posterior calf last week and again this week. The area anteriorly is far too adherent at this point. She lives in [place]. She has advanced home care home health. [facility] in [place] as well. 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 There is very apparent necrotic material in the anterior side. We've been using Santyl anteriorly and silver  alginate posteriorly. 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 I've gotten her an appointment at [facility] dermatology next week hopefully to have the original biopsy sites reviewed. The patient is still in a lot of pain. She is not straightening the leg out. She had an angiogram that did not suggest coexistent macrovascular disease 04/07/18; absolutely no improvement in this lady's condition. Extensive wounds on the right anterior covered in necrotic surface and the right posterior calf. There is better looking tissue posteriorly on the right over there is a large exposed tendon. In addition today she seems to develop some wounds on her toes on the left foot which were new this week especially the left third toe most circumferential loss. This unfortunate woman is in a lot of pain and I once again I've talked to her about an SHANIKA, LEVINGS. ([id number]) amputation of the right leg which she doesn't want to consider. 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 She has a large necrotic covered wound on the right anterior tibia oLarge full-thickness wound on the right posterior tibia with healthier looking tissue but a large exposed piece of tendon oNecrotic wound on the right lateral heel oThe new problem this week is wounds on her toes 04/14/18; this patient's overall condition in the right leg is unchanged she has 3 open areas of large necrotic area anteriorly. Better-looking surface but exposed tendon in the large wound posteriorly and an area on her right lateral calcaneus oShe has new wounds this week on the left first second and third toes Although these all look vascular previous arteriogram on the right did not show an obvious macrovascular etiology. This does not rule out a Small vesselvasculopathy. I have her being followed by Dr. Lillia Carmel at [facility] dermatology to see if they can review the slides already done to see if we can  establish a specific etiology i.e. calciphylaxis, antiphospholipid syndrome [  patient has SLE]; or some other cause of this. I have not wanted to attempt to rebiopsy this in our clinic leaving that to the academic dermatologist's. I note she is now on Levaquin I can see in [place] that the dermatologist culture something that was Pseudomonas. I'm not sure what they cultured. Electronic Signature(s) Signed: 04/16/2018 7:56:50 AM By: Linton Ham MD Entered By: Linton Ham on 04/15/2018 07:51:53 [name] ([id number]) -------------------------------------------------------------------------------- Physical Exam Details Patient Name: [name], [name]. Date of Service: 04/14/2018 3:15 PM Medical Record Number: [id number] Patient Account Number: [id number] Date of Birth/Sex: [birth year] (72 y.o. F) Treating RN: Montey Hora Primary Care Provider: Sharilyn Sites Other Clinician: Referring Provider: Sharilyn Sites Treating Provider/Extender: Tito Dine in Treatment: 5 Constitutional Patient is hypotensive.Does not look systemically medically unwell. Supine Blood Pressure is within target range for patient.. Pulse regular and within target range for patient.Marland Kitchen Respirations regular, non-labored and within target range.. She looks very uncomfortable. Cardiovascular Pedal pulses Are palpable. Gastrointestinal (GI) No abdominal pulsations. Notes Wound exam oOn the right anterior leg this is reasonably unchanged she has considerable tissue loss with a necrotic covering anteriorly. Large cleaner looking wound surface posteriorly however again substantial tissue loss. She has a small wound on the right lateral heel covered with necrotic eschar. I remove this with a #5 curet. There was not much bleeding however hemostasis with direct pressure. oNew this week on the left first second and third toes there are ischemic-looking wounds these are painful  otherwise nondescript and superficial. No evidence of infection Electronic Signature(s) Signed: 04/16/2018 7:56:50 AM By: Linton Ham MD Entered By: Linton Ham on 04/15/2018 07:54:04 [name] ([id number]) -------------------------------------------------------------------------------- Physician Orders Details Patient Name: [name]. Date of Service: 04/14/2018 3:15 PM Medical Record Number: [id number] Patient Account Number: [id number] Date of Birth/Sex: 10-Apr-1946 (72 y.o. F) Treating RN: Roger Shelter Primary Care Provider: Sharilyn Sites Other Clinician: Referring Provider: Sharilyn Sites Treating Provider/Extender: Tito Dine in Treatment: 5 Verbal / Phone Orders: No Diagnosis Coding Wound Cleansing Wound #1 Right,Anterior Lower Leg o Clean wound with Normal Saline. Wound #2 Right,Posterior Lower Leg o Clean wound with Normal Saline. Wound #3 Right,Lateral Calcaneus o Clean wound with Normal Saline. Wound #4 Left,Dorsal Metatarsal head first o Clean wound with Normal Saline. Wound #5 Left Toe Third o Clean wound with Normal Saline. Wound #6 Left Toe - Web between 2nd and 3rd o Clean wound with Normal Saline. Wound #7 Left Toe - Web between 3rd and 4th o Clean wound with Normal Saline. Wound #8 Dorsal Toe Great o Clean wound with Normal Saline. Wound #9 Right Toe - Web between 2nd and 3rd o Clean wound with Normal Saline. Anesthetic (add to Medication List) Wound #1 Right,Anterior Lower Leg o Topical Lidocaine 4% cream applied to wound bed prior to debridement (In Clinic Only). Wound #2 Right,Posterior Lower Leg o Topical Lidocaine 4% cream applied to wound bed prior to debridement (In Clinic Only). Wound #3 Right,Lateral Calcaneus o Topical Lidocaine 4% cream applied to wound bed prior to debridement (In Clinic Only). Wound #4 Left,Dorsal Metatarsal head first o Topical Lidocaine 4% cream applied to  wound bed prior to debridement (In Clinic Only). Wound #5 Left Toe Third o Topical Lidocaine 4% cream applied to wound bed prior to debridement (In Clinic Only). Wound #6 Left Toe - Web between 2nd and 3rd o Topical Lidocaine 4% cream applied to wound bed prior to debridement (In Clinic Only). [name], Dana F. (  [id number]) Wound #7 Left Toe - Web between 3rd and 4th o Topical Lidocaine 4% cream applied to wound bed prior to debridement (In Clinic Only). Wound #8 Dorsal Toe Great o Topical Lidocaine 4% cream applied to wound bed prior to debridement (In Clinic Only). Wound #9 Right Toe - Web between 2nd and 3rd o Topical Lidocaine 4% cream applied to wound bed prior to debridement (In Clinic Only). Primary Wound Dressing Wound #4 Left,Dorsal Metatarsal head first o Silver Alginate Wound #5 Left Toe Third o Silver Alginate Wound #6 Left Toe - Web between 2nd and 3rd o Silver Alginate Wound #7 Left Toe - Web between 3rd and 4th o Silver Alginate Wound #8 Dorsal Toe Great o Silver Alginate Wound #9 Right Toe - Web between 2nd and 3rd o Silver Alginate Wound #2 Right,Posterior Lower Leg o Silver Collagen Wound #3 Right,Lateral Calcaneus o Silver Collagen Wound #1 Right,Anterior Lower Leg o Santyl Ointment Secondary Dressing Wound #1 Right,Anterior Lower Leg o ABD pad Wound #2 Right,Posterior Lower Leg o ABD pad Wound #3 Right,Lateral Calcaneus o ABD pad Wound #4 Left,Dorsal Metatarsal head first o ABD pad Wound #5 Left Toe Third o ABD pad Wound #6 Left Toe - Web between 2nd and 3rd o ABD pad [name], Dana F. ([id number]) Wound #7 Left Toe - Web between 3rd and 4th o ABD pad Wound #8 Dorsal Toe Great o ABD pad Wound #9 Right Toe - Web between 2nd and 3rd o ABD pad Dressing Change Frequency Wound #1 Right,Anterior Lower Leg o Change Dressing Monday, Wednesday, Friday - HHRN to change on Friday and Monday Wound #2  Right,Posterior Lower Leg o Change Dressing Monday, Wednesday, Friday - HHRN to change on Friday and Monday Wound #3 Right,Lateral Calcaneus o Change Dressing Monday, Wednesday, Friday - HHRN to change on Friday and Monday Wound #4 Left,Dorsal Metatarsal head first o Change Dressing Monday, Wednesday, Friday - HHRN to change on Friday and Monday Wound #5 Left Toe Third o Change Dressing Monday, Wednesday, Friday - HHRN to change on Friday and Monday Wound #6 Left Toe - Web between 2nd and 3rd o Change Dressing Monday, Wednesday, Friday - HHRN to change on Friday and Monday Wound #7 Left Toe - Web between 3rd and 4th o Change Dressing Monday, Wednesday, Friday - HHRN to change on Friday and Monday Wound #8 Dorsal Toe Great o Change Dressing Monday, Wednesday, Friday - HHRN to change on Friday and Monday Wound #9 Right Toe - Web between 2nd and 3rd o Change Dressing Monday, Wednesday, Friday - HHRN to change on Friday and Monday Follow-up Appointments Wound #1 Right,Anterior Lower Leg o Return Appointment in 2 weeks. Wound #2 Right,Posterior Lower Leg o Return Appointment in 2 weeks. Wound #3 Right,Lateral Calcaneus o Return Appointment in 2 weeks. Wound #4 Left,Dorsal Metatarsal head first o Return Appointment in 2 weeks. Wound #5 Left Toe Third o Return Appointment in 2 weeks. Wound #6 Left Toe - Web between 2nd and 3rd o Return Appointment in 2 weeks. [name], Dana F. ([id number]) Wound #7 Left Toe - Web between 3rd and 4th o Return Appointment in 2 weeks. Wound #8 Dorsal Toe Great o Return Appointment in 2 weeks. Wound #9 Right Toe - Web between 2nd and 3rd o Return Appointment in 2 weeks. Edema Control Wound #1 Right,Anterior Lower Leg o Kerlix and Coban - Right Lower Extremity Wound #2 Right,Posterior Lower Leg o Kerlix and Coban - Right Lower Extremity Wound #3 Right,Lateral Calcaneus o Kerlix and Coban -  Right Lower  Extremity Home Health Wound #1 [place] Nurse may visit PRN to address patientos wound care needs. o FACE TO FACE ENCOUNTER: MEDICARE and MEDICAID PATIENTS: I certify that this patient is under my care and that I had a face-to-face encounter that meets the physician face-to-face encounter requirements with this patient on this date. The encounter with the patient was in whole or in part for the following MEDICAL CONDITION: (primary reason for [place]) MEDICAL NECESSITY: I certify, that based on my findings, NURSING services are a medically necessary home health service. HOME BOUND STATUS: I certify that my clinical findings support that this patient is homebound (i.e., Due to illness or injury, pt requires aid of supportive devices such as crutches, cane, wheelchairs, walkers, the use of special transportation or the assistance of another person to leave their place of residence. There is a normal inability to leave the home and doing so requires considerable and taxing effort. Other absences are for medical reasons / religious services and are infrequent or of short duration when for other reasons). o If current dressing causes regression in wound condition, may D/C ordered dressing product/s and apply Normal Saline Moist Dressing daily until next [place] / Other MD appointment. [place] of regression in wound condition at [phone]. o Please direct any NON-WOUND related issues/requests for orders to patient's Primary Care Physician Wound #2 [place] Nurse may visit PRN to address patientos wound care needs. o FACE TO FACE ENCOUNTER: MEDICARE and MEDICAID PATIENTS: I certify that this patient is under my care and that I had a face-to-face encounter that meets the physician face-to-face encounter requirements with  this patient on this date. The encounter with the patient was in whole or in part for the following MEDICAL CONDITION: (primary reason for [place]) MEDICAL NECESSITY: I certify, that based on my findings, NURSING services are a medically necessary home health service. HOME BOUND STATUS: I certify that my clinical findings support that this patient is homebound (i.e., Due to illness or injury, pt requires aid of supportive devices such as crutches, cane, wheelchairs, walkers, the use of special transportation or the assistance of another person to leave their place of residence. There is a normal inability to leave the home and doing so requires considerable and taxing effort. Other absences are for medical reasons / religious services and are infrequent or of short duration when for other reasons). o If current dressing causes regression in wound condition, may D/C ordered dressing product/s and apply Normal Saline Moist Dressing daily until next [place] / Other MD appointment. [place] of regression in wound condition at [phone]. o Please direct any NON-WOUND related issues/requests for orders to patient's Primary Care Physician [name], [name] ([id number]) Wound #3 [place] Nurse may visit PRN to address patientos wound care needs. o FACE TO FACE ENCOUNTER: MEDICARE and MEDICAID PATIENTS: I certify that this patient is under my care and that I had a face-to-face encounter that meets the physician face-to-face encounter requirements with this patient on this date. The encounter with the patient was in whole or in part for the following MEDICAL CONDITION: (primary reason for [place]) MEDICAL NECESSITY: I certify, that based on my findings, NURSING services are a medically necessary home health service. HOME BOUND STATUS: I certify that  my clinical findings support that  this patient is homebound (i.e., Due to illness or injury, pt requires aid of supportive devices such as crutches, cane, wheelchairs, walkers, the use of special transportation or the assistance of another person to leave their place of residence. There is a normal inability to leave the home and doing so requires considerable and taxing effort. Other absences are for medical reasons / religious services and are infrequent or of short duration when for other reasons). o If current dressing causes regression in wound condition, may D/C ordered dressing product/s and apply Normal Saline Moist Dressing daily until next [place] / Other MD appointment. [name] of regression in wound condition at [phone]. o Please direct any NON-WOUND related issues/requests for orders to patient's Primary Care Physician Wound #4 Left,Dorsal Metatarsal head first o [place] Visits o Home Health Nurse may visit PRN to address patientos wound care needs. o FACE TO FACE ENCOUNTER: MEDICARE and MEDICAID PATIENTS: I certify that this patient is under my care and that I had a face-to-face encounter that meets the physician face-to-face encounter requirements with this patient on this date. The encounter with the patient was in whole or in part for the following MEDICAL CONDITION: (primary reason for [place]) MEDICAL NECESSITY: I certify, that based on my findings, NURSING services are a medically necessary home health service. HOME BOUND STATUS: I certify that my clinical findings support that this patient is homebound (i.e., Due to illness or injury, pt requires aid of supportive devices such as crutches, cane, wheelchairs, walkers, the use of special transportation or the assistance of another person to leave their place of residence. There is a normal inability to leave the home and doing so requires considerable and taxing effort. Other absences are for  medical reasons / religious services and are infrequent or of short duration when for other reasons). o If current dressing causes regression in wound condition, may D/C ordered dressing product/s and apply Normal Saline Moist Dressing daily until next [place] / Other MD appointment. [place] of regression in wound condition at [phone]. o Please direct any NON-WOUND related issues/requests for orders to patient's Primary Care Physician Wound #5 Left Toe [place] Nurse may visit PRN to address patientos wound care needs. o FACE TO FACE ENCOUNTER: MEDICARE and MEDICAID PATIENTS: I certify that this patient is under my care and that I had a face-to-face encounter that meets the physician face-to-face encounter requirements with this patient on this date. The encounter with the patient was in whole or in part for the following MEDICAL CONDITION: (primary reason for [place]) MEDICAL NECESSITY: I certify, that based on my findings, NURSING services are a medically necessary home health service. HOME BOUND STATUS: I certify that my clinical findings support that this patient is homebound (i.e., Due to illness or injury, pt requires aid of supportive devices such as crutches, cane, wheelchairs, walkers, the use of special transportation or the assistance of another person to leave their place of residence. There is a normal inability to leave the home and doing so requires considerable and taxing effort. Other absences are for medical reasons / religious services and are infrequent or of short duration when for other reasons). o If current dressing causes regression in wound condition, may D/C ordered dressing product/s and apply Normal Saline Moist Dressing daily until next [place] / Other MD appointment. [facility] of regression in wound condition at [phone]. o Please  direct any NON-WOUND related issues/requests for orders to patient's Primary Care Physician Wound #6 Left Toe - Web between 2nd and [place] Nurse may visit PRN to address patientos wound care needs. [name], Dana F. ([id number]) o FACE TO FACE ENCOUNTER: MEDICARE and MEDICAID PATIENTS: I certify that this patient is under my care and that I had a face-to-face encounter that meets the physician face-to-face encounter requirements with this patient on this date. The encounter with the patient was in whole or in part for the following MEDICAL CONDITION: (primary reason for [place]) MEDICAL NECESSITY: I certify, that based on my findings, NURSING services are a medically necessary home health service. HOME BOUND STATUS: I certify that my clinical findings support that this patient is homebound (i.e., Due to illness or injury, pt requires aid of supportive devices such as crutches, cane, wheelchairs, walkers, the use of special transportation or the assistance of another person to leave their place of residence. There is a normal inability to leave the home and doing so requires considerable and taxing effort. Other absences are for medical reasons / religious services and are infrequent or of short duration when for other reasons). o If current dressing causes regression in wound condition, may D/C ordered dressing product/s and apply Normal Saline Moist Dressing daily until next [place] / Other MD appointment. [place] of regression in wound condition at [phone]. o Please direct any NON-WOUND related issues/requests for orders to patient's Primary Care Physician Wound #7 Left Toe - Web between 3rd and [place] Nurse may visit PRN to address patientos wound care needs. o FACE TO FACE ENCOUNTER: MEDICARE and MEDICAID PATIENTS: I certify that this patient is  under my care and that I had a face-to-face encounter that meets the physician face-to-face encounter requirements with this patient on this date. The encounter with the patient was in whole or in part for the following MEDICAL CONDITION: (primary reason for [place]) MEDICAL NECESSITY: I certify, that based on my findings, NURSING services are a medically necessary home health service. HOME BOUND STATUS: I certify that my clinical findings support that this patient is homebound (i.e., Due to illness or injury, pt requires aid of supportive devices such as crutches, cane, wheelchairs, walkers, the use of special transportation or the assistance of another person to leave their place of residence. There is a normal inability to leave the home and doing so requires considerable and taxing effort. Other absences are for medical reasons / religious services and are infrequent or of short duration when for other reasons). o If current dressing causes regression in wound condition, may D/C ordered dressing product/s and apply Normal Saline Moist Dressing daily until next [place] / Other MD appointment. [place] of regression in wound condition at [phone]. o Please direct any NON-WOUND related issues/requests for orders to patient's Primary Care Physician Wound #8 Dorsal Toe [place] Visits o Home Health Nurse may visit PRN to address patientos wound care needs. o FACE TO FACE ENCOUNTER: MEDICARE and MEDICAID PATIENTS: I certify that this patient is under my care and that I had a face-to-face encounter that meets the physician face-to-face encounter requirements with this patient on this date. The encounter with the patient was in whole or in part for the following MEDICAL CONDITION: (  primary reason for Home Healthcare) MEDICAL NECESSITY: I certify, that based on my findings, NURSING services are a medically necessary home health  service. HOME BOUND STATUS: I certify that my clinical findings support that this patient is homebound (i.e., Due to illness or injury, pt requires aid of supportive devices such as crutches, cane, wheelchairs, walkers, the use of special transportation or the assistance of another person to leave their place of residence. There is a normal inability to leave the home and doing so requires considerable and taxing effort. Other absences are for medical reasons / religious services and are infrequent or of short duration when for other reasons). o If current dressing causes regression in wound condition, may D/C ordered dressing product/s and apply Normal Saline Moist Dressing daily until next [place] / Other MD appointment. [place] of regression in wound condition at [phone]. o Please direct any NON-WOUND related issues/requests for orders to patient's Primary Care Physician Wound #9 Right Toe - Web between 2nd and [place] Nurse may visit PRN to address patientos wound care needs. o FACE TO FACE ENCOUNTER: MEDICARE and MEDICAID PATIENTS: I certify that this patient is under my care and that I had a face-to-face encounter that meets the physician face-to-face encounter requirements with this patient on this date. The encounter with the patient was in whole or in part for the following MEDICAL CONDITION: (primary reason for [place]) MEDICAL NECESSITY: I certify, that based on my findings, NURSING services are a medically necessary home health service. HOME BOUND STATUS: I certify that my EMALEE, KNIES ([id number]) clinical findings support that this patient is homebound (i.e., Due to illness or injury, pt requires aid of supportive devices such as crutches, cane, wheelchairs, walkers, the use of special transportation or the assistance of another person to leave their place of residence. There is  a normal inability to leave the home and doing so requires considerable and taxing effort. Other absences are for medical reasons / religious services and are infrequent or of short duration when for other reasons). o If current dressing causes regression in wound condition, may D/C ordered dressing product/s and apply Normal Saline Moist Dressing daily until next [place] / Other MD appointment. [place] of regression in wound condition at [phone]. o Please direct any NON-WOUND related issues/requests for orders to patient's Primary Care Physician Electronic Signature(s) Signed: 04/14/2018 4:58:57 PM By: Roger Shelter Signed: 04/16/2018 7:56:50 AM By: Linton Ham MD Entered By: Roger Shelter on 04/14/2018 16:36:37 [name] ([id number]) -------------------------------------------------------------------------------- Problem List Details Patient Name: [name], [name]. Date of Service: 04/14/2018 3:15 PM Medical Record Number: [id number] Patient Account Number: [id number] Date of Birth/Sex: Mar 12, 1946 (72 y.o. 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 were using Santyl to Medical change Anterior covered by tightly adherent necrotic debris. the large necrotic Decision areas anteriorly and Making : silver collagen posteriorly. 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 Lillia Carmel. She is attempting to get  previous biopsy slides for review by Medical her pathologist oI Decision note since last week Making : she has been put on Levaquin. In Epic I'm able to see this was for a Pseudomonas infection but I'm not sure where the culture result was from E11.622 Type 2 diabetes mellitus with other skin ulcer 03/10/2018 No Yes L89.610 Pressure ulcer of right heel, unstageable 03/10/2018 Yes Yes Status Complications Interventions Medical No necrotic surface using Santyl Decision change Making : [name], [name] ([id number]) L97.529 Non-pressure chronic ulcer of other part of left foot with 04/15/2018 Yes Yes unspecified severity Status Complications Interventions Other This is new this week. Ulcer left first second and third toes We apply silver very painful small ulcers that look vascular alginate to the wounds oPatient's husband says they have an appointment with [facility] vein and vascular next week I'll try to Medical reach out to them. Decision Previous angiogram Making : was negative In 3/19 for a significant inhibiting macrovascular lesion on the right. I'll inform them about the left wounds that are new. 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 Date of Service: 04/14/2018 3:15 PM Medical Record Number: [id number] Patient Account Number: [id number] Date of Birth/Sex: 26-Mar-1946 (72 y.o. F) Treating RN: Montey Hora Primary Care  Provider: Sharilyn Sites Other Clinician: Referring Provider: Sharilyn Sites Treating Provider/Extender: Tito Dine in Treatment: 5 Subjective History of Present Illness (HPI) ADMISSION 03/10/18; This is a 72 year old woman with a history of end-stage renal disease on hemodialysis, type 2 diabetes on insulin with excellent control, systemic lupus. Her problem began in October 2018 she developed a blister in an open wound on the right anterior tibial mid tibial area. This progressed and worsened until the tissue loss was substantial on the anterior and posterior right calf. she was followed in [place] by physical therapy at one point although I have not reviewed their notes. She apparently did not receive pulse lavage to the wound. I note that she was hospitalized from 03/01/18 through 03/03/18 predominantly for debridement of the wound by general surgery. She saw Dr. Arnoldo Morale who did not believe there was a role for surgical debridement. Was felt that definitive treatment would be amputation but the patient already been offered this by vein and vascular in [facility] and she refused arrangements were made for her to be sent to this clinic for review. The patient is already had a reasonably substantial review for this problem. she saw vein and vascular extensively over the course of this year.her noninvasive arterial studies done in December 2018 suggested a right superficial femoral artery occlusion/stenosis. She did not have any issues on the left. At that point her ABI on the right was 0.8 with monophasic waveforms 1.06 on the left. However she had a velocity drop off in the superficial femoral artery. She went on to have diagnostic angiogram on 12/18/17 by Dr. Oneida Alar. This showed on the right that the common femoral, profunda femoris and superficial femoral arteries were all patent. There was a focal less than 1 mm length stenosis on the right superficial femoral artery otherwise the  artery was widely patent. The right popliteal artery was widely patent. There was 3 vessel runoff via the anterior, tibial  and paranoid Milta Deiters arteries to the right foot. She was not felt to have a vascular cause of these wounds. sHe's also had 2 separate biopsies. Firstly on 3/25 showed nonspecific findings with calciphylaxis not entirely excluded. No definite calcium deposits were seen within the dermis within the sections reviewed. Vascular features were nonspecific. Furthermore pyogenic granuloma may demonstrate similar findings. 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 Calciphylaxis was not favored in this specimen. There was a calcified vessel. I don't see a pathology specimen that specifically says definitive calciphylaxis. The patient had an x-ray of her right tibia while she is in hospital there was no osteomyelitis. She had an MRI of her foot in March that did not show osteomyelitis as well The patient has been using Betadine and wraps to the wounds. This is being done by a home health where they live in [place] 03/17/18; patient admitted to clinic last week. 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 Considerable amount of necrotic surface material that I removed from the posterior calf last week and again this week. The area anteriorly is far too adherent at this point. She lives in [place]. She has advanced home care home health. Dialyzes in [place] as well. 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 There is very apparent necrotic material in the anterior side. We've been using Santyl anteriorly and silver alginate posteriorly. 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 I've gotten her an appointment at [facility] dermatology next week hopefully to  have the original biopsy sites reviewed. The patient is still in a lot of pain. She is not straightening the leg out. She had an angiogram that did not suggest coexistent macrovascular disease 04/07/18; absolutely no improvement in this lady's condition. Extensive wounds on the right anterior covered in necrotic surface and the right posterior calf. There is better looking tissue posteriorly on the right over there is a large exposed tendon. In addition today she seems to develop some wounds on her toes on the left foot which were new this week especially the left Viviano, Tierany F. ([id number]) third toe most circumferential loss. This unfortunate woman is in a lot of pain and I once again I've talked to her about an amputation of the right leg which she doesn't want to consider. 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 She has a large necrotic covered wound on the right anterior tibia Large full-thickness wound on the right posterior tibia with healthier looking tissue but a large exposed piece of tendon Necrotic wound on the right lateral heel The new problem this week is wounds on her toes 04/14/18; this patient's overall condition in the right leg is unchanged she has 3 open areas of large necrotic area anteriorly. Better-looking surface but exposed tendon in the large wound posteriorly and an area on her right lateral calcaneus She has new wounds this week on the left first second and third toes Although these all look vascular previous arteriogram on the right did not show an obvious macrovascular etiology. This does not rule out a Small vesselvasculopathy. I have her being followed by Dr. Lillia Carmel at [facility] dermatology to see if they can review the slides already done to see if we can establish a specific etiology i.e. calciphylaxis, antiphospholipid syndrome [  patient has SLE]; or some other cause of this. I have not  wanted to attempt to rebiopsy this in our clinic leaving that to the academic dermatologist's. I note she is now on Levaquin I can see in [place] that the dermatologist culture something that was Pseudomonas. I'm not sure what they cultured. Objective Constitutional Patient is hypotensive.Does not look systemically medically unwell. Supine Blood Pressure is within target range for patient.. Pulse regular and within target range for patient.Marland Kitchen Respirations regular, non-labored and within target range.. She looks very uncomfortable. Vitals Time Taken: 3:20 PM, Height: 65 in, Weight: 164 lbs, BMI: 27.3, Temperature: 98.1 F, Pulse: 68 bpm, Respiratory Rate: 18 breaths/min, Blood Pressure: 99/53 mmHg. Cardiovascular Pedal pulses Are palpable. Gastrointestinal (GI) No abdominal pulsations. General Notes: Wound exam On the right anterior leg this is reasonably unchanged she has considerable tissue loss with a necrotic covering anteriorly. Large cleaner looking wound surface posteriorly however again substantial tissue loss. She has a small wound on the right lateral heel covered with necrotic eschar. I remove this with a #5 curet. There was not much bleeding however hemostasis with direct pressure. New this week on the left first second and third toes there are ischemic- looking wounds these are painful otherwise nondescript and superficial. No evidence of infection Integumentary (Hair, Skin) Wound #1 status is Open. Original cause of wound was Gradually Appeared. The wound is located on the Right,Anterior Lower Leg. The wound measures 13cm length x 6cm width x 0.3cm depth; 61.261cm^2 area and 18.378cm^3 volume. There is no tunneling or undermining noted. There is small (1-33%) red, pink granulation within the wound bed. There is a large (67- 100%) amount of necrotic tissue within the wound bed including Eschar and Adherent Slough. The periwound skin appearance exhibited: Scarring,  Hemosiderin Staining. Dana, [name] F. ([id number]) Wound #2 status is Open. Original cause of wound was Gradually Appeared. The wound is located on the Right,Posterior Lower Leg. The wound measures 12.5cm length x 9cm width x 0.5cm depth; 88.357cm^2 area and 44.179cm^3 volume. There is no tunneling or undermining noted. There is a large amount of serosanguineous drainage noted. The wound margin is distinct with the outline attached to the wound base. There is large (67-100%) red granulation within the wound bed. There is a small (1-33%) amount of necrotic tissue within the wound bed including Eschar and Adherent Slough. Periwound temperature was noted as No Abnormality. The periwound has tenderness on palpation. Wound #3 status is Open. Original cause of wound was Gradually Appeared. The wound is located on the Right,Lateral Calcaneus. The wound measures 1.5cm length x 1.5cm width x 0.1cm depth; 1.767cm^2 area and 0.177cm^3 volume. There is no tunneling or undermining noted. There is a none present amount of drainage noted. The wound margin is distinct with the outline attached to the wound base. There is no granulation within the wound bed. There is a large (67-100%) amount of necrotic tissue within the wound bed. Periwound temperature was noted as No Abnormality. The periwound has tenderness on palpation. Wound #4 status is Open. Original cause of wound was Pressure Injury. The wound is located on the Left,Dorsal Metatarsal head first. The wound measures 0.1cm length x 0.1cm width x 0.1cm depth; 0.008cm^2 area and 0.001cm^3 volume. There is Fat Layer (Subcutaneous Tissue) Exposed exposed. There is no tunneling or undermining noted. There is a small amount of serosanguineous drainage noted. Foul odor after cleansing was noted. The wound margin is distinct with the outline attached to the wound base.  There is small (1-33%) pink granulation within the wound bed. There is a large  (67-100%) amount of necrotic tissue within the wound bed including Eschar and Adherent Slough. The periwound skin appearance did not exhibit: Callus, Crepitus, Excoriation, Induration, Rash, Scarring, Dry/Scaly, Maceration, Atrophie Blanche, Cyanosis, Ecchymosis, Hemosiderin Staining, Mottled, Pallor, Rubor, Erythema. Periwound temperature was noted as No Abnormality. The periwound has tenderness on palpation. Wound #5 status is Open. Original cause of wound was Pressure Injury. The wound is located on the Left Toe Third. The wound measures 0.1cm length x 0.1cm width x 0.1cm depth; 0.008cm^2 area and 0.001cm^3 volume. There is no tunneling or undermining noted. There is a none present amount of drainage noted. Foul odor after cleansing was noted. The wound margin is distinct with the outline attached to the wound base. There is no granulation within the wound bed. There is a large (67-100%) amount of necrotic tissue within the wound bed including Eschar. The periwound skin appearance did not exhibit: Callus, Crepitus, Excoriation, Induration, Rash, Scarring, Dry/Scaly, Maceration, Atrophie Blanche, Cyanosis, Ecchymosis, Hemosiderin Staining, Mottled, Pallor, Rubor, Erythema. Periwound temperature was noted as No Abnormality. The periwound has tenderness on palpation. Wound #6 status is Open. Original cause of wound was Gradually Appeared. The wound is located on the Left Toe - Web between 2nd and 3rd. The wound measures 3cm length x 4.5cm width x 0.1cm depth; 10.603cm^2 area and 1.06cm^3 volume. There is no tunneling or undermining noted. There is a large amount of purulent drainage noted. The wound margin is flat and intact. There is no granulation within the wound bed. There is a large (67-100%) amount of necrotic tissue within the wound bed including Adherent Slough. The periwound skin appearance exhibited: Maceration. Periwound temperature was noted as No Abnormality. The periwound has  tenderness on palpation. Wound #7 status is Open. Original cause of wound was Gradually Appeared. The wound is located on the Left Toe - Web between 3rd and 4th. The wound measures 1.5cm length x 3.5cm width x 0.1cm depth; 4.123cm^2 area and 0.412cm^3 volume. There is no tunneling or undermining noted. There is a large amount of purulent drainage noted. The wound margin is flat and intact. There is no granulation within the wound bed. There is a large (67-100%) amount of necrotic tissue within the wound bed including Adherent Slough. The periwound skin appearance exhibited: Maceration. Periwound temperature was noted as No Abnormality. The periwound has tenderness on palpation. Wound #8 status is Open. Original cause of wound was Gradually Appeared. The wound is located on the Dorsal Toe Great. The wound measures 1.2cm length x 0.8cm width x 0.1cm depth; 0.754cm^2 area and 0.075cm^3 volume. There is no tunneling or undermining noted. There is a medium amount of purulent drainage noted. The wound margin is distinct with the outline attached to the wound base. There is no granulation within the wound bed. There is a large (67-100%) amount of necrotic tissue within the wound bed including Eschar and Adherent Slough. Periwound temperature was noted as No Abnormality. The periwound has tenderness on palpation. Wound #9 status is Open. Original cause of wound was Gradually Appeared. The wound is located on the Right Toe - Web between 2nd and 3rd. The wound measures 1.5cm length x 3.5cm width x 0.1cm depth; 4.123cm^2 area and 0.412cm^3 volume. There is no tunneling or undermining noted. There is a large amount of purulent drainage noted. The wound margin is distinct with the outline attached to the wound base. There is no granulation within the wound  bed. There is a large (67- 100%) amount of necrotic tissue within the wound bed including Adherent Slough. The periwound skin appearance  exhibited: [name], [name]. ([id number]) Maceration. Periwound temperature was noted as No Abnormality. The periwound has tenderness on palpation. 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 There was a Excisional Skin/Subcutaneous Tissue Debridement with a total area of 2.25 sq cm performed by Ricard Dillon, MD. With the following instrument(s): Curette to remove Viable and Non-Viable tissue/material. Material removed includes Callus, Subcutaneous Tissue, Slough, and Biofilm after achieving pain control using Other (lidocaine 4%). No specimens were taken. A time out was conducted at 16:26, prior to the start of the procedure. A Moderate amount of bleeding was controlled with Pressure. The procedure was tolerated well with a pain level of 0 throughout and a pain level of 0 following the procedure. Patient s Level of Consciousness post procedure was recorded as Awake and Alert. Post Debridement Measurements: 1.5cm length x 1.5cm width x 0.3cm depth; 0.53cm^3 volume. Character of Wound/Ulcer Post Debridement is stable. Severity of Tissue Post Debridement is: Fat layer exposed. Post procedure Diagnosis Wound #3: Same as Pre-Procedure Plan Wound Cleansing: Wound #1 Right,Anterior Lower Leg: Clean wound with Normal Saline. Wound #2 Right,Posterior Lower Leg: Clean wound with Normal Saline. Wound #3 Right,Lateral  Calcaneus: Clean wound with Normal Saline. Wound #4 Left,Dorsal Metatarsal head first: Clean wound with Normal Saline. Wound #5 Left Toe Third: Clean wound with Normal Saline. Wound #6 Left Toe - Web between 2nd and 3rd: Clean wound with Normal Saline. Wound #7 Left Toe - Web between 3rd and 4th: LYSA, LIVENGOOD. ([id number]) Clean wound with Normal Saline. Wound #8 Dorsal Toe Great: Clean wound with Normal Saline. Wound #9 Right Toe - Web between 2nd and 3rd: Clean wound with Normal Saline. Anesthetic (add to Medication List): Wound #1 Right,Anterior Lower Leg: Topical Lidocaine 4% cream applied to wound bed prior to debridement (In Clinic Only). Wound #2 Right,Posterior Lower Leg: Topical Lidocaine 4% cream applied to wound bed prior to debridement (In Clinic Only). Wound #3 Right,Lateral Calcaneus: Topical Lidocaine 4% cream applied to wound bed prior to debridement (In Clinic Only). Wound #4 Left,Dorsal Metatarsal head first: Topical Lidocaine 4% cream applied to wound bed prior to debridement (In Clinic Only). Wound #5 Left Toe Third: Topical Lidocaine 4% cream applied to wound bed prior to debridement (In Clinic Only). Wound #6 Left Toe - Web between 2nd and 3rd: Topical Lidocaine 4% cream applied to wound bed prior to debridement (In Clinic Only). Wound #7 Left Toe - Web between 3rd and 4th: Topical Lidocaine 4% cream applied to wound bed prior to debridement (In Clinic Only). Wound #8 Dorsal Toe Great: Topical Lidocaine 4% cream applied to wound bed prior to debridement (In Clinic Only). Wound #9 Right Toe - Web between 2nd and 3rd: Topical Lidocaine 4% cream applied to wound bed prior to debridement (In Clinic Only). Primary Wound Dressing: Wound #4 Left,Dorsal Metatarsal head first: Silver Alginate Wound #5 Left Toe Third:  Silver Alginate Wound #6 Left Toe - Web between 2nd and 3rd: Silver Alginate Wound #7 Left Toe - Web between 3rd and 4th: Silver  Alginate Wound #8 Dorsal Toe Great: Silver Alginate Wound #9 Right Toe - Web between 2nd and 3rd: Silver Alginate Wound #2 Right,Posterior Lower Leg: Silver Collagen Wound #3 Right,Lateral Calcaneus: Silver Collagen Wound #1 Right,Anterior Lower Leg: Santyl Ointment Secondary Dressing: Wound #1 Right,Anterior Lower Leg: ABD pad Wound #2 Right,Posterior Lower Leg: ABD pad Wound #3 Right,Lateral Calcaneus: ABD pad Wound #4 Left,Dorsal Metatarsal head first: ABD pad Wound #5 Left Toe Third: ABD pad Wound #6 Left Toe - Web between 2nd and 3rd: ABD pad Wound #7 Left Toe - Web between 3rd and 4th: ABD pad Wound #8 Dorsal Toe Great: ABD pad ALJEAN, HORIUCHI F. ([id number]) Wound #9 Right Toe - Web between 2nd and 3rd: ABD pad Dressing Change Frequency: Wound #1 Right,Anterior Lower Leg: Change Dressing Monday, Wednesday, Friday - HHRN to change on Friday and Monday Wound #2 Right,Posterior Lower Leg: Change Dressing Monday, Wednesday, Friday - HHRN to change on Friday and Monday Wound #3 Right,Lateral Calcaneus: Change Dressing Monday, Wednesday, Friday - HHRN to change on Friday and Monday Wound #4 Left,Dorsal Metatarsal head first: Change Dressing Monday, Wednesday, Friday - HHRN to change on Friday and Monday Wound #5 Left Toe Third: Change Dressing Monday, Wednesday, Friday - HHRN to change on Friday and Monday Wound #6 Left Toe - Web between 2nd and 3rd: Change Dressing Monday, Wednesday, Friday - HHRN to change on Friday and Monday Wound #7 Left Toe - Web between 3rd and 4th: Change Dressing Monday, Wednesday, Friday - HHRN to change on Friday and Monday Wound #8 Dorsal Toe Great: Change Dressing Monday, Wednesday, Friday - HHRN to change on Friday and Monday Wound #9 Right Toe - Web between 2nd and 3rd: Change Dressing Monday, Wednesday, Friday - HHRN to change on Friday and Monday Follow-up Appointments: Wound #1 Right,Anterior Lower Leg: Return Appointment in 2  weeks. Wound #2 Right,Posterior Lower Leg: Return Appointment in 2 weeks. Wound #3 Right,Lateral Calcaneus: Return Appointment in 2 weeks. Wound #4 Left,Dorsal Metatarsal head first: Return Appointment in 2 weeks. Wound #5 Left Toe Third: Return Appointment in 2 weeks. Wound #6 Left Toe - Web between 2nd and 3rd: Return Appointment in 2 weeks. Wound #7 Left Toe - Web between 3rd and 4th: Return Appointment in 2 weeks. Wound #8 Dorsal Toe Great: Return Appointment in 2 weeks. Wound #9 Right Toe - Web between 2nd and 3rd: Return Appointment in 2 weeks. Edema Control: Wound #1 Right,Anterior Lower Leg: Kerlix and Coban - Right Lower Extremity Wound #2 Right,Posterior Lower Leg: Kerlix and Coban - Right Lower Extremity Wound #3 Right,Lateral Calcaneus: Kerlix and Coban - Right Lower Extremity Home Health: Wound #1 Right,Anterior Lower Leg: [place] Nurse may visit PRN to address patient s wound care needs. FACE TO FACE ENCOUNTER: MEDICARE and MEDICAID PATIENTS: I certify that this patient is under my care and that I had a face-to-face encounter that meets the physician face-to-face encounter requirements with this patient on this date. The encounter with the patient was in whole or in part for the following MEDICAL CONDITION: (primary reason for [place]) MEDICAL NECESSITY: I certify, that based on my findings, NURSING services are a medically necessary home health service. HOME BOUND STATUS: I certify that my clinical findings support that this patient is homebound (i.e., Due to illness or injury, pt requires aid of  supportive devices such as crutches, cane, wheelchairs, walkers, the use of special transportation or the assistance of another person to leave their place of residence. There is a normal inability to leave the home and doing so requires considerable and taxing effort. Other absences are for medical reasons / religious services  and are infrequent or of short duration when for other reasons). ASHLON, LOTTMAN F. ([id number]) If current dressing causes regression in wound condition, may D/C ordered dressing product/s and apply Normal Saline Moist Dressing daily until next [place] / Other MD appointment. [place] of regression in wound condition at [phone]. Please direct any NON-WOUND related issues/requests for orders to patient's Primary Care Physician Wound #2 Right,Posterior Lower Leg: [place] Nurse may visit PRN to address patient s wound care needs. FACE TO FACE ENCOUNTER: MEDICARE and MEDICAID PATIENTS: I certify that this patient is under my care and that I had a face-to-face encounter that meets the physician face-to-face encounter requirements with this patient on this date. The encounter with the patient was in whole or in part for the following MEDICAL CONDITION: (primary reason for [place]) MEDICAL NECESSITY: I certify, that based on my findings, NURSING services are a medically necessary home health service. HOME BOUND STATUS: I certify that my clinical findings support that this patient is homebound (i.e., Due to illness or injury, pt requires aid of supportive devices such as crutches, cane, wheelchairs, walkers, the use of special transportation or the assistance of another person to leave their place of residence. There is a normal inability to leave the home and doing so requires considerable and taxing effort. Other absences are for medical reasons / religious services and are infrequent or of short duration when for other reasons). If current dressing causes regression in wound condition, may D/C ordered dressing product/s and apply Normal Saline Moist Dressing daily until next [place] / Other MD appointment. [place] of regression in wound condition at [phone]. Please direct any  NON-WOUND related issues/requests for orders to patient's Primary Care Physician Wound #3 Right,Lateral Calcaneus: [place] Nurse may visit PRN to address patient s wound care needs. FACE TO FACE ENCOUNTER: MEDICARE and MEDICAID PATIENTS: I certify that this patient is under my care and that I had a face-to-face encounter that meets the physician face-to-face encounter requirements with this patient on this date. The encounter with the patient was in whole or in part for the following MEDICAL CONDITION: (primary reason for [place]) MEDICAL NECESSITY: I certify, that based on my findings, NURSING services are a medically necessary home health service. HOME BOUND STATUS: I certify that my clinical findings support that this patient is homebound (i.e., Due to illness or injury, pt requires aid of supportive devices such as crutches, cane, wheelchairs, walkers, the use of special transportation or the assistance of another person to leave their place of residence. There is a normal inability to leave the home and doing so requires considerable and taxing effort. Other absences are for medical reasons / religious services and are infrequent or of short duration when for other reasons). If current dressing causes regression in wound condition, may D/C ordered dressing product/s and apply Normal Saline Moist Dressing daily until next [place] / Other MD appointment. [place] of regression in wound condition at [phone]. Please direct any NON-WOUND related issues/requests for orders to patient's Primary Care Physician Wound #4 Left,Dorsal Metatarsal  head first: [place] Nurse may visit PRN to address patient s wound care needs. FACE TO FACE ENCOUNTER: MEDICARE and MEDICAID PATIENTS: I certify that this patient is under my care and that I had a face-to-face encounter that meets the physician  face-to-face encounter requirements with this patient on this date. The encounter with the patient was in whole or in part for the following MEDICAL CONDITION: (primary reason for [place]) MEDICAL NECESSITY: I certify, that based on my findings, NURSING services are a medically necessary home health service. HOME BOUND STATUS: I certify that my clinical findings support that this patient is homebound (i.e., Due to illness or injury, pt requires aid of supportive devices such as crutches, cane, wheelchairs, walkers, the use of special transportation or the assistance of another person to leave their place of residence. There is a normal inability to leave the home and doing so requires considerable and taxing effort. Other absences are for medical reasons / religious services and are infrequent or of short duration when for other reasons). If current dressing causes regression in wound condition, may D/C ordered dressing product/s and apply Normal Saline Moist Dressing daily until next [place] / Other MD appointment. [place] of regression in wound condition at [phone]. Please direct any NON-WOUND related issues/requests for orders to patient's Primary Care Physician Wound #5 Left Toe Third: [place] Nurse may visit PRN to address patient s wound care needs. FACE TO FACE ENCOUNTER: MEDICARE and MEDICAID PATIENTS: I certify that this patient is under my care and that I had a face-to-face encounter that meets the physician face-to-face encounter requirements with this patient on this date. The encounter with the patient was in whole or in part for the following MEDICAL CONDITION: (primary reason for [place]) MEDICAL NECESSITY: I certify, that based on my findings, NURSING services are a medically necessary home health service. HOME BOUND STATUS: I certify that my clinical findings support that this patient is  homebound (i.e., Due to KENNADY, ZIMMERLE. ([id number]) illness or injury, pt requires aid of supportive devices such as crutches, cane, wheelchairs, walkers, the use of special transportation or the assistance of another person to leave their place of residence. There is a normal inability to leave the home and doing so requires considerable and taxing effort. Other absences are for medical reasons / religious services and are infrequent or of short duration when for other reasons). If current dressing causes regression in wound condition, may D/C ordered dressing product/s and apply Normal Saline Moist Dressing daily until next [place] / Other MD appointment. [place] of regression in wound condition at [phone]. Please direct any NON-WOUND related issues/requests for orders to patient's Primary Care Physician Wound #6 Left Toe - Web between 2nd and 3rd: [place] Nurse may visit PRN to address patient s wound care needs. FACE TO FACE ENCOUNTER: MEDICARE and MEDICAID PATIENTS: I certify that this patient is under my care and that I had a face-to-face encounter that meets the physician face-to-face encounter requirements with this patient on this date. The encounter with the patient was in whole or in part for the following MEDICAL CONDITION: (primary reason for [place]) MEDICAL NECESSITY: I certify, that based on my findings, NURSING services are a medically necessary home health service. HOME BOUND STATUS: I certify that my clinical findings support that this patient is homebound (i.e., Due to  illness or injury, pt requires aid of supportive devices such as crutches, cane, wheelchairs, walkers, the use of special transportation or the assistance of another person to leave their place of residence. There is a normal inability to leave the home and doing so requires considerable and taxing effort. Other absences are for  medical reasons / religious services and are infrequent or of short duration when for other reasons). If current dressing causes regression in wound condition, may D/C ordered dressing product/s and apply Normal Saline Moist Dressing daily until next [place] / Other MD appointment. [place] of regression in wound condition at [phone]. Please direct any NON-WOUND related issues/requests for orders to patient's Primary Care Physician Wound #7 Left Toe - Web between 3rd and 4th: [place] Nurse may visit PRN to address patient s wound care needs. FACE TO FACE ENCOUNTER: MEDICARE and MEDICAID PATIENTS: I certify that this patient is under my care and that I had a face-to-face encounter that meets the physician face-to-face encounter requirements with this patient on this date. The encounter with the patient was in whole or in part for the following MEDICAL CONDITION: (primary reason for [place]) MEDICAL NECESSITY: I certify, that based on my findings, NURSING services are a medically necessary home health service. HOME BOUND STATUS: I certify that my clinical findings support that this patient is homebound (i.e., Due to illness or injury, pt requires aid of supportive devices such as crutches, cane, wheelchairs, walkers, the use of special transportation or the assistance of another person to leave their place of residence. There is a normal inability to leave the home and doing so requires considerable and taxing effort. Other absences are for medical reasons / religious services and are infrequent or of short duration when for other reasons). If current dressing causes regression in wound condition, may D/C ordered dressing product/s and apply Normal Saline Moist Dressing daily until next [place] / Other MD appointment. [place] of regression in wound condition at [phone]. Please direct  any NON-WOUND related issues/requests for orders to patient's Primary Care Physician Wound #8 Dorsal Toe Great: [place] Nurse may visit PRN to address patient s wound care needs. FACE TO FACE ENCOUNTER: MEDICARE and MEDICAID PATIENTS: I certify that this patient is under my care and that I had a face-to-face encounter that meets the physician face-to-face encounter requirements with this patient on this date. The encounter with the patient was in whole or in part for the following MEDICAL CONDITION: (primary reason for [place]) MEDICAL NECESSITY: I certify, that based on my findings, NURSING services are a medically necessary home health service. HOME BOUND STATUS: I certify that my clinical findings support that this patient is homebound (i.e., Due to illness or injury, pt requires aid of supportive devices such as crutches, cane, wheelchairs, walkers, the use of special transportation or the assistance of another person to leave their place of residence. There is a normal inability to leave the home and doing so requires considerable and taxing effort. Other absences are for medical reasons / religious services and are infrequent or of short duration when for other reasons). If current dressing causes regression in wound condition, may D/C ordered dressing product/s and apply Normal Saline Moist Dressing daily until next [place] / Other MD appointment. [place] of regression in wound condition at [phone]. Please direct any NON-WOUND related issues/requests for orders  to patient's Primary Care Physician Wound #9 Right Toe - Web between 2nd and 3rd: [place] Nurse may visit PRN to address patient s wound care needs. FACE TO FACE ENCOUNTER: MEDICARE and MEDICAID PATIENTS: I certify that this patient is under my care and that I LYSSA, HACKLEY. ([id number]) had a face-to-face encounter  that meets the physician face-to-face encounter requirements with this patient on this date. The encounter with the patient was in whole or in part for the following MEDICAL CONDITION: (primary reason for [place]) MEDICAL NECESSITY: I certify, that based on my findings, NURSING services are a medically necessary home health service. HOME BOUND STATUS: I certify that my clinical findings support that this patient is homebound (i.e., Due to illness or injury, pt requires aid of supportive devices such as crutches, cane, wheelchairs, walkers, the use of special transportation or the assistance of another person to leave their place of residence. There is a normal inability to leave the home and doing so requires considerable and taxing effort. Other absences are for medical reasons / religious services and are infrequent or of short duration when for other reasons). If current dressing causes regression in wound condition, may D/C ordered dressing product/s and apply Normal Saline Moist Dressing daily until next [place] / Other MD appointment. [place] of regression in wound condition at [phone]. Please direct any NON-WOUND related issues/requests for orders to patient's Primary Care Physician Medical Decision Making 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scle 03/10/2018 Status: No change Complications: central tissue loss on the right anterior and posterior. Anterior covered by tightly adherent necrotic debris. Interventions: were using Santyl to the large necrotic areas anteriorly and silver collagen posteriorly. 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 Lillia Carmel. She is attempting to get previous biopsy slides for review by her pathologist I note since last  week she has been put on Levaquin. 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 Ulcer left first second and third toes very painful small ulcers that look vascular Interventions: We apply silver alginate to the wounds Patient's husband says they have an appointment with [facility] vein and vascular next week I'll try to reach out to them. Previous angiogram was negative In 3/19 for a significant inhibiting macrovascular lesion on the right. I'll inform them about the left wounds that are new. #1 the patient is in too much pain currently for debridement #2 she says she is tolerating dialysis #3 I'm going to see if I can reach out the vein and vascular for a final thought about her macrovascular status and wondering whether she needs to be screened for an atheromatous emboli syndrome. As mentioned I have dermatology reviewing her biopsy sites for other causes of this seemingly vascular type wound. This will be predominantly reviewing her current slides #4 she is also seeing her rheumatologist. I wonder about antiphospholipid syndrome and this woman who has SLE #5 the patient is now on Levaquin ordered by the dermatologist I don't really know what they cultured. I don't see really  any evidence of active infection Electronic Signature(s) Signed: 04/16/2018 7:56:50 AM By: Linton Ham MD Entered By: Linton Ham on 04/15/2018 07:56:11 [name] ([id number]) -------------------------------------------------------------------------------- SuperBill Details Patient Name: [name]. Date of Service: 04/14/2018 Medical Record Number:  [id number] Patient Account Number: [id number] Date of Birth/Sex: 1946-08-16 (72 y.o. 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 Little I13.2 disease, or end stage renal disease L97.413 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

## 2018-04-19 DIAGNOSIS — J449 Chronic obstructive pulmonary disease, unspecified: Secondary | ICD-10-CM | POA: Diagnosis not present

## 2018-04-19 DIAGNOSIS — M150 Primary generalized (osteo)arthritis: Secondary | ICD-10-CM | POA: Diagnosis not present

## 2018-04-19 DIAGNOSIS — H409 Unspecified glaucoma: Secondary | ICD-10-CM | POA: Diagnosis not present

## 2018-04-19 DIAGNOSIS — I4891 Unspecified atrial fibrillation: Secondary | ICD-10-CM | POA: Diagnosis not present

## 2018-04-19 DIAGNOSIS — I872 Venous insufficiency (chronic) (peripheral): Secondary | ICD-10-CM | POA: Diagnosis not present

## 2018-04-19 DIAGNOSIS — N186 End stage renal disease: Secondary | ICD-10-CM | POA: Diagnosis not present

## 2018-04-19 DIAGNOSIS — Z87891 Personal history of nicotine dependence: Secondary | ICD-10-CM | POA: Diagnosis not present

## 2018-04-19 DIAGNOSIS — M109 Gout, unspecified: Secondary | ICD-10-CM | POA: Diagnosis not present

## 2018-04-19 DIAGNOSIS — Z992 Dependence on renal dialysis: Secondary | ICD-10-CM | POA: Diagnosis not present

## 2018-04-19 DIAGNOSIS — E785 Hyperlipidemia, unspecified: Secondary | ICD-10-CM | POA: Diagnosis not present

## 2018-04-19 DIAGNOSIS — Z794 Long term (current) use of insulin: Secondary | ICD-10-CM | POA: Diagnosis not present

## 2018-04-19 DIAGNOSIS — M329 Systemic lupus erythematosus, unspecified: Secondary | ICD-10-CM | POA: Diagnosis not present

## 2018-04-19 DIAGNOSIS — E1122 Type 2 diabetes mellitus with diabetic chronic kidney disease: Secondary | ICD-10-CM | POA: Diagnosis not present

## 2018-04-20 DIAGNOSIS — N186 End stage renal disease: Secondary | ICD-10-CM | POA: Diagnosis not present

## 2018-04-20 DIAGNOSIS — D631 Anemia in chronic kidney disease: Secondary | ICD-10-CM | POA: Diagnosis not present

## 2018-04-20 DIAGNOSIS — E039 Hypothyroidism, unspecified: Secondary | ICD-10-CM | POA: Diagnosis not present

## 2018-04-20 DIAGNOSIS — D689 Coagulation defect, unspecified: Secondary | ICD-10-CM | POA: Diagnosis not present

## 2018-04-20 DIAGNOSIS — D509 Iron deficiency anemia, unspecified: Secondary | ICD-10-CM | POA: Diagnosis not present

## 2018-04-20 DIAGNOSIS — N2581 Secondary hyperparathyroidism of renal origin: Secondary | ICD-10-CM | POA: Diagnosis not present

## 2018-04-20 DIAGNOSIS — E876 Hypokalemia: Secondary | ICD-10-CM | POA: Diagnosis not present

## 2018-04-20 DIAGNOSIS — E1129 Type 2 diabetes mellitus with other diabetic kidney complication: Secondary | ICD-10-CM | POA: Diagnosis not present

## 2018-04-21 ENCOUNTER — Encounter: Payer: Self-pay | Admitting: Vascular Surgery

## 2018-04-21 ENCOUNTER — Ambulatory Visit (INDEPENDENT_AMBULATORY_CARE_PROVIDER_SITE_OTHER): Payer: Medicare Other | Admitting: Vascular Surgery

## 2018-04-21 ENCOUNTER — Ambulatory Visit: Payer: Medicare Other | Admitting: Vascular Surgery

## 2018-04-21 ENCOUNTER — Other Ambulatory Visit: Payer: Self-pay

## 2018-04-21 VITALS — BP 96/56 | HR 73 | Temp 97.2°F | Resp 18 | Ht 64.0 in | Wt 175.0 lb

## 2018-04-21 DIAGNOSIS — M109 Gout, unspecified: Secondary | ICD-10-CM | POA: Diagnosis not present

## 2018-04-21 DIAGNOSIS — Z87891 Personal history of nicotine dependence: Secondary | ICD-10-CM | POA: Diagnosis not present

## 2018-04-21 DIAGNOSIS — E1122 Type 2 diabetes mellitus with diabetic chronic kidney disease: Secondary | ICD-10-CM | POA: Diagnosis not present

## 2018-04-21 DIAGNOSIS — J449 Chronic obstructive pulmonary disease, unspecified: Secondary | ICD-10-CM | POA: Diagnosis not present

## 2018-04-21 DIAGNOSIS — Z794 Long term (current) use of insulin: Secondary | ICD-10-CM | POA: Diagnosis not present

## 2018-04-21 DIAGNOSIS — N186 End stage renal disease: Secondary | ICD-10-CM | POA: Diagnosis not present

## 2018-04-21 DIAGNOSIS — I4891 Unspecified atrial fibrillation: Secondary | ICD-10-CM | POA: Diagnosis not present

## 2018-04-21 DIAGNOSIS — Z48812 Encounter for surgical aftercare following surgery on the circulatory system: Secondary | ICD-10-CM | POA: Diagnosis not present

## 2018-04-21 DIAGNOSIS — E785 Hyperlipidemia, unspecified: Secondary | ICD-10-CM | POA: Diagnosis not present

## 2018-04-21 DIAGNOSIS — I872 Venous insufficiency (chronic) (peripheral): Secondary | ICD-10-CM | POA: Diagnosis not present

## 2018-04-21 DIAGNOSIS — H409 Unspecified glaucoma: Secondary | ICD-10-CM | POA: Diagnosis not present

## 2018-04-21 DIAGNOSIS — M150 Primary generalized (osteo)arthritis: Secondary | ICD-10-CM | POA: Diagnosis not present

## 2018-04-21 DIAGNOSIS — M329 Systemic lupus erythematosus, unspecified: Secondary | ICD-10-CM | POA: Diagnosis not present

## 2018-04-21 DIAGNOSIS — Z992 Dependence on renal dialysis: Secondary | ICD-10-CM | POA: Diagnosis not present

## 2018-04-21 NOTE — Progress Notes (Signed)
Patient name: [name] MRN: [id number] DOB: [birth year] Sex: female  REASON FOR VISIT:   Follow-up   HPI:   [name] is a pleasant 72 y.o. female who had a second stage basilic vein transposition done by Dr. [name] on 01/13/2018.  The patient had some hematoma postoperatively and presented to the office with a small wound overlying the fistula that I thought was at risk for bleeding.  On 03/15/2018, she underwent exploration of her left upper arm fistula and evacuation of hematoma.  She comes in for a follow-up visit.  She has no specific complaints referrable to the left upper arm fistula at this point.  She is using her right IJ tunneled dialysis catheter for hemodialysis.  They have not yet used her fistula which is right about 3 months out.  She is also being followed with wounds on both feet and is being seen in [place] by a dermatologist there.  Dr. Oneida Alar had previously performed arteriography on the patient because of these wounds and there were no options for revascularization.  The feeling was that she could potentially require amputation if the wounds fail to heal.  Current Outpatient Medications  Medication Sig Dispense Refill  . acetaminophen (TYLENOL) 500 MG tablet Take 1,000 mg by mouth every 6 (six) hours as needed. For pain    . albuterol (ACCUNEB) 0.63 MG/3ML nebulizer solution Take 1 ampule by nebulization every 6 (six) hours as needed for wheezing or shortness of breath.     Marland Kitchen albuterol (PROVENTIL HFA;VENTOLIN HFA) 108 (90 BASE) MCG/ACT inhaler Inhale 2 puffs into the lungs every 6 (six) hours as needed. For shortness of breath    . Amino Acids-Protein Hydrolys (FEEDING SUPPLEMENT, PRO-STAT SUGAR FREE 64,) LIQD Take 30 mLs by mouth 2 (two) times daily. (Patient taking differently: Take 30 mLs by mouth 3 (three) times daily with meals. ) 900 mL 0  . apixaban (ELIQUIS) 5 MG TABS tablet Take 1 tablet (5 mg total) by mouth 2 (two) times daily. 60  tablet 6  . atorvastatin (LIPITOR) 20 MG tablet Take 20 mg by mouth at bedtime.     . brimonidine-timolol (COMBIGAN) 0.2-0.5 % ophthalmic solution Place 1 drop into the left eye daily.     . calcium acetate (PHOSLO) 667 MG capsule Take 667 mg by mouth 3 (three) times daily with meals.    . dicyclomine (BENTYL) 10 MG capsule Take 10 mg by mouth 4 (four) times daily -  before meals and at bedtime.    . febuxostat (ULORIC) 40 MG tablet Take 40 mg by mouth daily.    . folic acid (FOLVITE) 1 MG tablet Take 1 mg by mouth daily.    . hydroxychloroquine (PLAQUENIL) 200 MG tablet Take 300 mg by mouth as directed. Take 1 1/2 tablets daily    . Insulin Detemir (LEVEMIR FLEXTOUCH) 100 UNIT/ML Pen Inject 8 Units into the skin every morning.     . insulin lispro (HUMALOG KWIKPEN) 100 UNIT/ML KiwkPen Inject 5 Units into the skin 3 (three) times daily with meals as needed (*Only take if blood sugar levels are over 150).     . metoprolol tartrate (LOPRESSOR) 50 MG tablet Take 50 mg by mouth See admin instructions. Take 1 tablet twice a day, except on Tuesday, Thursday, Saturday, only take 1 tablet daily in the evening    . Multiple Vitamins-Minerals (CENTRUM SILVER PO) Take 1 tablet by mouth daily.    Marland Kitchen oxyCODONE (ROXICODONE) 5 MG immediate release  tablet Take 1 tablet (5 mg total) by mouth every 4 (four) hours as needed for severe pain. 12 tablet 0  . oxyCODONE-acetaminophen (PERCOCET/ROXICET) 5-325 MG tablet Take 1 tablet by mouth every 6 (six) hours as needed for severe pain. 20 tablet 0  . pantoprazole (PROTONIX) 40 MG tablet Take 1 tablet (40 mg total) by mouth daily. 30 tablet 11  . predniSONE (DELTASONE) 5 MG tablet Take 5 mg by mouth daily with breakfast.    . silver sulfADIAZINE (SILVADENE) 1 % cream Apply 1 application topically daily.    . travoprost, benzalkonium, (TRAVATAN) 0.004 % ophthalmic solution Place 1 drop into the left eye at bedtime.     No current facility-administered medications for this  visit.     REVIEW OF SYSTEMS:  [X]  denotes positive finding, [ ]  denotes negative finding Vascular    Leg swelling    Cardiac    Chest pain or chest pressure:    Shortness of breath upon exertion:    Short of breath when lying flat:    Irregular heart rhythm:    Constitutional    Fever or chills:     PHYSICAL EXAM:   Vitals:   04/21/18 1342  BP: (!) 96/56  Pulse: 73  Resp: 18  Temp: (!) 97.2 F (36.2 C)  TempSrc: Oral  SpO2: 100%  Weight: 175 lb (79.4 kg)  Height: 5\' 4"  (1.626 m)    GENERAL: The patient is a well-nourished female, in no acute distress. The vital signs are documented above. CARDIOVASCULAR: There is a regular rate and rhythm. PULMONARY: There is good air exchange bilaterally without wheezing or rales Her fistula has a palpable thrill but the vein is not especially large.  There is a diminished left radial pulse. She has a pressure dressing on her right leg and I suspect she has an underlying venous ulcer.  The husband states there is a significant wound.  She did not want [place] to remove the dressings today nor did she want [place] to try to get her up on the table for a full exam. On the left side she has some gangrenous changes to multiple toes.  DATA:   ARTERIOGRAPHY: I tried to review the arteriogram in the office today but am unable to retrieve the films on merge.  I reviewed the operative report and the patient had no significant infrainguinal arterial occlusive disease.  There was three-vessel runoff on the right and two-vessel runoff on the left with no real options for revascularization.  MEDICAL ISSUES:   STATUS POST BASILIC VEIN TRANSPOSITION: This patient had a second stage basilic vein transposition on 01/13/2018.  Currently the vein does not appear adequate size for cannulation.  I have arranged for a duplex scan of the fistula and a follow-up visit with Dr. Oneida Alar.  The patient has not yet had a follow-up study.  In the meantime they will continue to  use her catheter.  BILATERAL LEG WOUNDS: Dr. Oneida Alar had also been following the patient with wounds on both feet. Arteriography in March did not show any options for revascularization.  I have arranged follow-up ABIs when she comes in for a visit with Dr. Oneida Alar.  [name] [facility] [phone]

## 2018-04-22 DIAGNOSIS — E876 Hypokalemia: Secondary | ICD-10-CM | POA: Diagnosis not present

## 2018-04-22 DIAGNOSIS — N2581 Secondary hyperparathyroidism of renal origin: Secondary | ICD-10-CM | POA: Diagnosis not present

## 2018-04-22 DIAGNOSIS — N186 End stage renal disease: Secondary | ICD-10-CM | POA: Diagnosis not present

## 2018-04-22 DIAGNOSIS — E039 Hypothyroidism, unspecified: Secondary | ICD-10-CM | POA: Diagnosis not present

## 2018-04-22 DIAGNOSIS — D509 Iron deficiency anemia, unspecified: Secondary | ICD-10-CM | POA: Diagnosis not present

## 2018-04-22 DIAGNOSIS — D689 Coagulation defect, unspecified: Secondary | ICD-10-CM | POA: Diagnosis not present

## 2018-04-22 DIAGNOSIS — E1129 Type 2 diabetes mellitus with other diabetic kidney complication: Secondary | ICD-10-CM | POA: Diagnosis not present

## 2018-04-22 DIAGNOSIS — D631 Anemia in chronic kidney disease: Secondary | ICD-10-CM | POA: Diagnosis not present

## 2018-04-23 DIAGNOSIS — M150 Primary generalized (osteo)arthritis: Secondary | ICD-10-CM | POA: Diagnosis not present

## 2018-04-23 DIAGNOSIS — E785 Hyperlipidemia, unspecified: Secondary | ICD-10-CM | POA: Diagnosis not present

## 2018-04-23 DIAGNOSIS — M109 Gout, unspecified: Secondary | ICD-10-CM | POA: Diagnosis not present

## 2018-04-23 DIAGNOSIS — M329 Systemic lupus erythematosus, unspecified: Secondary | ICD-10-CM | POA: Diagnosis not present

## 2018-04-23 DIAGNOSIS — Z87891 Personal history of nicotine dependence: Secondary | ICD-10-CM | POA: Diagnosis not present

## 2018-04-23 DIAGNOSIS — J449 Chronic obstructive pulmonary disease, unspecified: Secondary | ICD-10-CM | POA: Diagnosis not present

## 2018-04-23 DIAGNOSIS — Z992 Dependence on renal dialysis: Secondary | ICD-10-CM | POA: Diagnosis not present

## 2018-04-23 DIAGNOSIS — E1122 Type 2 diabetes mellitus with diabetic chronic kidney disease: Secondary | ICD-10-CM | POA: Diagnosis not present

## 2018-04-23 DIAGNOSIS — Z794 Long term (current) use of insulin: Secondary | ICD-10-CM | POA: Diagnosis not present

## 2018-04-23 DIAGNOSIS — N186 End stage renal disease: Secondary | ICD-10-CM | POA: Diagnosis not present

## 2018-04-23 DIAGNOSIS — H409 Unspecified glaucoma: Secondary | ICD-10-CM | POA: Diagnosis not present

## 2018-04-23 DIAGNOSIS — I872 Venous insufficiency (chronic) (peripheral): Secondary | ICD-10-CM | POA: Diagnosis not present

## 2018-04-23 DIAGNOSIS — I4891 Unspecified atrial fibrillation: Secondary | ICD-10-CM | POA: Diagnosis not present

## 2018-04-24 DIAGNOSIS — D689 Coagulation defect, unspecified: Secondary | ICD-10-CM | POA: Diagnosis not present

## 2018-04-24 DIAGNOSIS — E876 Hypokalemia: Secondary | ICD-10-CM | POA: Diagnosis not present

## 2018-04-24 DIAGNOSIS — E039 Hypothyroidism, unspecified: Secondary | ICD-10-CM | POA: Diagnosis not present

## 2018-04-24 DIAGNOSIS — N186 End stage renal disease: Secondary | ICD-10-CM | POA: Diagnosis not present

## 2018-04-24 DIAGNOSIS — E1129 Type 2 diabetes mellitus with other diabetic kidney complication: Secondary | ICD-10-CM | POA: Diagnosis not present

## 2018-04-24 DIAGNOSIS — D631 Anemia in chronic kidney disease: Secondary | ICD-10-CM | POA: Diagnosis not present

## 2018-04-24 DIAGNOSIS — N2581 Secondary hyperparathyroidism of renal origin: Secondary | ICD-10-CM | POA: Diagnosis not present

## 2018-04-24 DIAGNOSIS — D509 Iron deficiency anemia, unspecified: Secondary | ICD-10-CM | POA: Diagnosis not present

## 2018-04-26 DIAGNOSIS — M329 Systemic lupus erythematosus, unspecified: Secondary | ICD-10-CM | POA: Diagnosis not present

## 2018-04-26 DIAGNOSIS — E785 Hyperlipidemia, unspecified: Secondary | ICD-10-CM | POA: Diagnosis not present

## 2018-04-26 DIAGNOSIS — Z87891 Personal history of nicotine dependence: Secondary | ICD-10-CM | POA: Diagnosis not present

## 2018-04-26 DIAGNOSIS — J449 Chronic obstructive pulmonary disease, unspecified: Secondary | ICD-10-CM | POA: Diagnosis not present

## 2018-04-26 DIAGNOSIS — N186 End stage renal disease: Secondary | ICD-10-CM | POA: Diagnosis not present

## 2018-04-26 DIAGNOSIS — I4891 Unspecified atrial fibrillation: Secondary | ICD-10-CM | POA: Diagnosis not present

## 2018-04-26 DIAGNOSIS — Z992 Dependence on renal dialysis: Secondary | ICD-10-CM | POA: Diagnosis not present

## 2018-04-26 DIAGNOSIS — M150 Primary generalized (osteo)arthritis: Secondary | ICD-10-CM | POA: Diagnosis not present

## 2018-04-26 DIAGNOSIS — H409 Unspecified glaucoma: Secondary | ICD-10-CM | POA: Diagnosis not present

## 2018-04-26 DIAGNOSIS — I872 Venous insufficiency (chronic) (peripheral): Secondary | ICD-10-CM | POA: Diagnosis not present

## 2018-04-26 DIAGNOSIS — E1122 Type 2 diabetes mellitus with diabetic chronic kidney disease: Secondary | ICD-10-CM | POA: Diagnosis not present

## 2018-04-26 DIAGNOSIS — M109 Gout, unspecified: Secondary | ICD-10-CM | POA: Diagnosis not present

## 2018-04-26 DIAGNOSIS — Z794 Long term (current) use of insulin: Secondary | ICD-10-CM | POA: Diagnosis not present

## 2018-04-27 DIAGNOSIS — E1129 Type 2 diabetes mellitus with other diabetic kidney complication: Secondary | ICD-10-CM | POA: Diagnosis not present

## 2018-04-27 DIAGNOSIS — N2581 Secondary hyperparathyroidism of renal origin: Secondary | ICD-10-CM | POA: Diagnosis not present

## 2018-04-27 DIAGNOSIS — E876 Hypokalemia: Secondary | ICD-10-CM | POA: Diagnosis not present

## 2018-04-27 DIAGNOSIS — D631 Anemia in chronic kidney disease: Secondary | ICD-10-CM | POA: Diagnosis not present

## 2018-04-27 DIAGNOSIS — N186 End stage renal disease: Secondary | ICD-10-CM | POA: Diagnosis not present

## 2018-04-27 DIAGNOSIS — E039 Hypothyroidism, unspecified: Secondary | ICD-10-CM | POA: Diagnosis not present

## 2018-04-27 DIAGNOSIS — D509 Iron deficiency anemia, unspecified: Secondary | ICD-10-CM | POA: Diagnosis not present

## 2018-04-27 DIAGNOSIS — D689 Coagulation defect, unspecified: Secondary | ICD-10-CM | POA: Diagnosis not present

## 2018-04-28 ENCOUNTER — Other Ambulatory Visit: Payer: Self-pay

## 2018-04-28 ENCOUNTER — Encounter: Payer: Medicare Other | Admitting: Internal Medicine

## 2018-04-28 DIAGNOSIS — S81801A Unspecified open wound, right lower leg, initial encounter: Secondary | ICD-10-CM | POA: Diagnosis not present

## 2018-04-28 DIAGNOSIS — M069 Rheumatoid arthritis, unspecified: Secondary | ICD-10-CM | POA: Diagnosis not present

## 2018-04-28 DIAGNOSIS — J449 Chronic obstructive pulmonary disease, unspecified: Secondary | ICD-10-CM | POA: Diagnosis not present

## 2018-04-28 DIAGNOSIS — Z992 Dependence on renal dialysis: Secondary | ICD-10-CM | POA: Diagnosis not present

## 2018-04-28 DIAGNOSIS — N186 End stage renal disease: Secondary | ICD-10-CM | POA: Diagnosis not present

## 2018-04-28 DIAGNOSIS — I132 Hypertensive heart and chronic kidney disease with heart failure and with stage 5 chronic kidney disease, or end stage renal disease: Secondary | ICD-10-CM | POA: Diagnosis not present

## 2018-04-28 DIAGNOSIS — L97219 Non-pressure chronic ulcer of right calf with unspecified severity: Secondary | ICD-10-CM

## 2018-04-28 DIAGNOSIS — L97215 Non-pressure chronic ulcer of right calf with muscle involvement without evidence of necrosis: Secondary | ICD-10-CM | POA: Diagnosis not present

## 2018-04-28 DIAGNOSIS — E1129 Type 2 diabetes mellitus with other diabetic kidney complication: Secondary | ICD-10-CM | POA: Diagnosis not present

## 2018-04-28 DIAGNOSIS — L97519 Non-pressure chronic ulcer of other part of right foot with unspecified severity: Secondary | ICD-10-CM | POA: Diagnosis not present

## 2018-04-28 DIAGNOSIS — I739 Peripheral vascular disease, unspecified: Secondary | ICD-10-CM

## 2018-04-28 DIAGNOSIS — L89899 Pressure ulcer of other site, unspecified stage: Secondary | ICD-10-CM | POA: Diagnosis not present

## 2018-04-28 DIAGNOSIS — E1122 Type 2 diabetes mellitus with diabetic chronic kidney disease: Secondary | ICD-10-CM | POA: Diagnosis not present

## 2018-04-28 DIAGNOSIS — I509 Heart failure, unspecified: Secondary | ICD-10-CM | POA: Diagnosis not present

## 2018-04-28 DIAGNOSIS — B359 Dermatophytosis, unspecified: Secondary | ICD-10-CM | POA: Diagnosis not present

## 2018-04-28 DIAGNOSIS — M109 Gout, unspecified: Secondary | ICD-10-CM | POA: Diagnosis not present

## 2018-04-28 DIAGNOSIS — N185 Chronic kidney disease, stage 5: Secondary | ICD-10-CM | POA: Diagnosis not present

## 2018-04-28 DIAGNOSIS — S91105A Unspecified open wound of left lesser toe(s) without damage to nail, initial encounter: Secondary | ICD-10-CM | POA: Diagnosis not present

## 2018-04-28 DIAGNOSIS — L97419 Non-pressure chronic ulcer of right heel and midfoot with unspecified severity: Secondary | ICD-10-CM | POA: Diagnosis not present

## 2018-04-28 DIAGNOSIS — L942 Calcinosis cutis: Secondary | ICD-10-CM | POA: Diagnosis not present

## 2018-04-28 DIAGNOSIS — L97819 Non-pressure chronic ulcer of other part of right lower leg with unspecified severity: Secondary | ICD-10-CM | POA: Diagnosis not present

## 2018-04-28 DIAGNOSIS — R21 Rash and other nonspecific skin eruption: Secondary | ICD-10-CM | POA: Diagnosis not present

## 2018-04-28 DIAGNOSIS — L988 Other specified disorders of the skin and subcutaneous tissue: Secondary | ICD-10-CM | POA: Diagnosis not present

## 2018-04-28 DIAGNOSIS — L89610 Pressure ulcer of right heel, unstageable: Secondary | ICD-10-CM | POA: Diagnosis not present

## 2018-04-29 DIAGNOSIS — E1129 Type 2 diabetes mellitus with other diabetic kidney complication: Secondary | ICD-10-CM | POA: Diagnosis not present

## 2018-04-29 DIAGNOSIS — D631 Anemia in chronic kidney disease: Secondary | ICD-10-CM | POA: Diagnosis not present

## 2018-04-29 DIAGNOSIS — N2581 Secondary hyperparathyroidism of renal origin: Secondary | ICD-10-CM | POA: Diagnosis not present

## 2018-04-29 DIAGNOSIS — E876 Hypokalemia: Secondary | ICD-10-CM | POA: Diagnosis not present

## 2018-04-29 DIAGNOSIS — D509 Iron deficiency anemia, unspecified: Secondary | ICD-10-CM | POA: Diagnosis not present

## 2018-04-29 DIAGNOSIS — D689 Coagulation defect, unspecified: Secondary | ICD-10-CM | POA: Diagnosis not present

## 2018-04-29 DIAGNOSIS — N186 End stage renal disease: Secondary | ICD-10-CM | POA: Diagnosis not present

## 2018-04-30 DIAGNOSIS — M329 Systemic lupus erythematosus, unspecified: Secondary | ICD-10-CM | POA: Diagnosis not present

## 2018-04-30 DIAGNOSIS — J449 Chronic obstructive pulmonary disease, unspecified: Secondary | ICD-10-CM | POA: Diagnosis not present

## 2018-04-30 DIAGNOSIS — Z992 Dependence on renal dialysis: Secondary | ICD-10-CM | POA: Diagnosis not present

## 2018-04-30 DIAGNOSIS — I4891 Unspecified atrial fibrillation: Secondary | ICD-10-CM | POA: Diagnosis not present

## 2018-04-30 DIAGNOSIS — D649 Anemia, unspecified: Secondary | ICD-10-CM | POA: Diagnosis not present

## 2018-04-30 DIAGNOSIS — Z794 Long term (current) use of insulin: Secondary | ICD-10-CM | POA: Diagnosis not present

## 2018-04-30 DIAGNOSIS — N186 End stage renal disease: Secondary | ICD-10-CM | POA: Diagnosis not present

## 2018-04-30 DIAGNOSIS — E785 Hyperlipidemia, unspecified: Secondary | ICD-10-CM | POA: Diagnosis not present

## 2018-04-30 DIAGNOSIS — I872 Venous insufficiency (chronic) (peripheral): Secondary | ICD-10-CM | POA: Diagnosis not present

## 2018-04-30 DIAGNOSIS — N189 Chronic kidney disease, unspecified: Secondary | ICD-10-CM | POA: Diagnosis not present

## 2018-04-30 DIAGNOSIS — M159 Polyosteoarthritis, unspecified: Secondary | ICD-10-CM | POA: Diagnosis not present

## 2018-04-30 DIAGNOSIS — M150 Primary generalized (osteo)arthritis: Secondary | ICD-10-CM | POA: Diagnosis not present

## 2018-04-30 DIAGNOSIS — Z87891 Personal history of nicotine dependence: Secondary | ICD-10-CM | POA: Diagnosis not present

## 2018-04-30 DIAGNOSIS — E1122 Type 2 diabetes mellitus with diabetic chronic kidney disease: Secondary | ICD-10-CM | POA: Diagnosis not present

## 2018-04-30 DIAGNOSIS — M109 Gout, unspecified: Secondary | ICD-10-CM | POA: Diagnosis not present

## 2018-04-30 DIAGNOSIS — H409 Unspecified glaucoma: Secondary | ICD-10-CM | POA: Diagnosis not present

## 2018-04-30 DIAGNOSIS — Z7952 Long term (current) use of systemic steroids: Secondary | ICD-10-CM | POA: Diagnosis not present

## 2018-05-01 DIAGNOSIS — D689 Coagulation defect, unspecified: Secondary | ICD-10-CM | POA: Diagnosis not present

## 2018-05-01 DIAGNOSIS — N2581 Secondary hyperparathyroidism of renal origin: Secondary | ICD-10-CM | POA: Diagnosis not present

## 2018-05-01 DIAGNOSIS — E876 Hypokalemia: Secondary | ICD-10-CM | POA: Diagnosis not present

## 2018-05-01 DIAGNOSIS — E1129 Type 2 diabetes mellitus with other diabetic kidney complication: Secondary | ICD-10-CM | POA: Diagnosis not present

## 2018-05-01 DIAGNOSIS — D631 Anemia in chronic kidney disease: Secondary | ICD-10-CM | POA: Diagnosis not present

## 2018-05-01 DIAGNOSIS — N186 End stage renal disease: Secondary | ICD-10-CM | POA: Diagnosis not present

## 2018-05-01 DIAGNOSIS — D509 Iron deficiency anemia, unspecified: Secondary | ICD-10-CM | POA: Diagnosis not present

## 2018-05-03 DIAGNOSIS — I4891 Unspecified atrial fibrillation: Secondary | ICD-10-CM | POA: Diagnosis not present

## 2018-05-03 DIAGNOSIS — M109 Gout, unspecified: Secondary | ICD-10-CM | POA: Diagnosis not present

## 2018-05-03 DIAGNOSIS — M329 Systemic lupus erythematosus, unspecified: Secondary | ICD-10-CM | POA: Diagnosis not present

## 2018-05-03 DIAGNOSIS — J449 Chronic obstructive pulmonary disease, unspecified: Secondary | ICD-10-CM | POA: Diagnosis not present

## 2018-05-03 DIAGNOSIS — Z87891 Personal history of nicotine dependence: Secondary | ICD-10-CM | POA: Diagnosis not present

## 2018-05-03 DIAGNOSIS — I872 Venous insufficiency (chronic) (peripheral): Secondary | ICD-10-CM | POA: Diagnosis not present

## 2018-05-03 DIAGNOSIS — Z794 Long term (current) use of insulin: Secondary | ICD-10-CM | POA: Diagnosis not present

## 2018-05-03 DIAGNOSIS — E1122 Type 2 diabetes mellitus with diabetic chronic kidney disease: Secondary | ICD-10-CM | POA: Diagnosis not present

## 2018-05-03 DIAGNOSIS — H409 Unspecified glaucoma: Secondary | ICD-10-CM | POA: Diagnosis not present

## 2018-05-03 DIAGNOSIS — Z992 Dependence on renal dialysis: Secondary | ICD-10-CM | POA: Diagnosis not present

## 2018-05-03 DIAGNOSIS — N186 End stage renal disease: Secondary | ICD-10-CM | POA: Diagnosis not present

## 2018-05-03 DIAGNOSIS — E785 Hyperlipidemia, unspecified: Secondary | ICD-10-CM | POA: Diagnosis not present

## 2018-05-03 DIAGNOSIS — M150 Primary generalized (osteo)arthritis: Secondary | ICD-10-CM | POA: Diagnosis not present

## 2018-05-04 DIAGNOSIS — N2581 Secondary hyperparathyroidism of renal origin: Secondary | ICD-10-CM | POA: Diagnosis not present

## 2018-05-04 DIAGNOSIS — D689 Coagulation defect, unspecified: Secondary | ICD-10-CM | POA: Diagnosis not present

## 2018-05-04 DIAGNOSIS — E876 Hypokalemia: Secondary | ICD-10-CM | POA: Diagnosis not present

## 2018-05-04 DIAGNOSIS — N186 End stage renal disease: Secondary | ICD-10-CM | POA: Diagnosis not present

## 2018-05-04 DIAGNOSIS — D509 Iron deficiency anemia, unspecified: Secondary | ICD-10-CM | POA: Diagnosis not present

## 2018-05-04 DIAGNOSIS — D631 Anemia in chronic kidney disease: Secondary | ICD-10-CM | POA: Diagnosis not present

## 2018-05-04 DIAGNOSIS — E1129 Type 2 diabetes mellitus with other diabetic kidney complication: Secondary | ICD-10-CM | POA: Diagnosis not present

## 2018-05-05 ENCOUNTER — Other Ambulatory Visit: Payer: Self-pay | Admitting: "Endocrinology

## 2018-05-05 ENCOUNTER — Encounter (INDEPENDENT_AMBULATORY_CARE_PROVIDER_SITE_OTHER): Payer: Medicare Other | Admitting: Ophthalmology

## 2018-05-05 DIAGNOSIS — N184 Chronic kidney disease, stage 4 (severe): Secondary | ICD-10-CM | POA: Diagnosis not present

## 2018-05-05 DIAGNOSIS — I4891 Unspecified atrial fibrillation: Secondary | ICD-10-CM | POA: Diagnosis not present

## 2018-05-05 DIAGNOSIS — E1122 Type 2 diabetes mellitus with diabetic chronic kidney disease: Secondary | ICD-10-CM | POA: Diagnosis not present

## 2018-05-05 DIAGNOSIS — Z87891 Personal history of nicotine dependence: Secondary | ICD-10-CM | POA: Diagnosis not present

## 2018-05-05 DIAGNOSIS — M109 Gout, unspecified: Secondary | ICD-10-CM | POA: Diagnosis not present

## 2018-05-05 DIAGNOSIS — Z992 Dependence on renal dialysis: Secondary | ICD-10-CM | POA: Diagnosis not present

## 2018-05-05 DIAGNOSIS — H409 Unspecified glaucoma: Secondary | ICD-10-CM | POA: Diagnosis not present

## 2018-05-05 DIAGNOSIS — J449 Chronic obstructive pulmonary disease, unspecified: Secondary | ICD-10-CM | POA: Diagnosis not present

## 2018-05-05 DIAGNOSIS — M329 Systemic lupus erythematosus, unspecified: Secondary | ICD-10-CM | POA: Diagnosis not present

## 2018-05-05 DIAGNOSIS — E785 Hyperlipidemia, unspecified: Secondary | ICD-10-CM | POA: Diagnosis not present

## 2018-05-05 DIAGNOSIS — I872 Venous insufficiency (chronic) (peripheral): Secondary | ICD-10-CM | POA: Diagnosis not present

## 2018-05-05 DIAGNOSIS — M150 Primary generalized (osteo)arthritis: Secondary | ICD-10-CM | POA: Diagnosis not present

## 2018-05-05 DIAGNOSIS — Z794 Long term (current) use of insulin: Secondary | ICD-10-CM | POA: Diagnosis not present

## 2018-05-05 DIAGNOSIS — N186 End stage renal disease: Secondary | ICD-10-CM | POA: Diagnosis not present

## 2018-05-06 DIAGNOSIS — M1990 Unspecified osteoarthritis, unspecified site: Secondary | ICD-10-CM | POA: Diagnosis not present

## 2018-05-06 DIAGNOSIS — D631 Anemia in chronic kidney disease: Secondary | ICD-10-CM | POA: Diagnosis not present

## 2018-05-06 DIAGNOSIS — E876 Hypokalemia: Secondary | ICD-10-CM | POA: Diagnosis not present

## 2018-05-06 DIAGNOSIS — D689 Coagulation defect, unspecified: Secondary | ICD-10-CM | POA: Diagnosis not present

## 2018-05-06 DIAGNOSIS — J449 Chronic obstructive pulmonary disease, unspecified: Secondary | ICD-10-CM | POA: Diagnosis not present

## 2018-05-06 DIAGNOSIS — N186 End stage renal disease: Secondary | ICD-10-CM | POA: Diagnosis not present

## 2018-05-06 DIAGNOSIS — N2581 Secondary hyperparathyroidism of renal origin: Secondary | ICD-10-CM | POA: Diagnosis not present

## 2018-05-06 DIAGNOSIS — E1129 Type 2 diabetes mellitus with other diabetic kidney complication: Secondary | ICD-10-CM | POA: Diagnosis not present

## 2018-05-06 DIAGNOSIS — I509 Heart failure, unspecified: Secondary | ICD-10-CM | POA: Diagnosis not present

## 2018-05-06 DIAGNOSIS — D509 Iron deficiency anemia, unspecified: Secondary | ICD-10-CM | POA: Diagnosis not present

## 2018-05-06 LAB — COMPREHENSIVE METABOLIC PANEL
ALBUMIN: 2.6 g/dL — AB (ref 3.5–4.8)
ALK PHOS: 101 IU/L (ref 39–117)
ALT: 7 IU/L (ref 0–32)
AST: 15 IU/L (ref 0–40)
Albumin/Globulin Ratio: 0.8 — ABNORMAL LOW (ref 1.2–2.2)
BILIRUBIN TOTAL: 0.3 mg/dL (ref 0.0–1.2)
BUN / CREAT RATIO: 11 — AB (ref 12–28)
BUN: 50 mg/dL — ABNORMAL HIGH (ref 8–27)
CHLORIDE: 92 mmol/L — AB (ref 96–106)
CO2: 23 mmol/L (ref 20–29)
Calcium: 8 mg/dL — ABNORMAL LOW (ref 8.7–10.3)
Creatinine, Ser: 4.54 mg/dL — ABNORMAL HIGH (ref 0.57–1.00)
GFR calc Af Amer: 10 mL/min/{1.73_m2} — ABNORMAL LOW (ref >59)
GFR calc non Af Amer: 9 mL/min/{1.73_m2} — ABNORMAL LOW (ref >59)
GLOBULIN, TOTAL: 3.4 g/dL (ref 1.5–4.5)
GLUCOSE: 83 mg/dL (ref 65–99)
Potassium: 3.1 mmol/L — ABNORMAL LOW (ref 3.5–5.2)
SODIUM: 136 mmol/L (ref 134–144)
Total Protein: 6 g/dL (ref 6.0–8.5)

## 2018-05-06 LAB — HGB A1C W/O EAG: HEMOGLOBIN A1C: 6.2 % — AB (ref 4.8–5.6)

## 2018-05-06 LAB — SPECIMEN STATUS REPORT

## 2018-05-06 NOTE — Progress Notes (Signed)
[facility] Note  05/07/2018     CHIEF COMPLAINT Patient presents for Retina Follow Up   HISTORY OF PRESENT ILLNESS: [name] is a 72 y.o. female who presents to the clinic today for:   HPI    Retina Follow Up    Patient presents with  Diabetic Retinopathy.  In left eye.  This started 3 months ago.  Severity is mild.  Since onset it is stable.  I, the attending physician,  performed the HPI with the patient and updated documentation appropriately.          Comments    F/U Dm Ou. Patient states she has occasional floaters Os, she is using Combigan gtt's Bid and Travtan ou HS.Denies new visual onsets. BS 224 this am ,Bs fluctuates ,denies visual issues with Bs.       Last edited by Bernarda Caffey, MD on 05/07/2018  1:42 PM. (History)      Referring physician: Sharilyn Sites, MD [address] [place], [place] [address]  HISTORICAL INFORMATION:   Selected notes from the MEDICAL RECORD NUMBER Referred by Dr. Kathlen Mody for concern of BDR LEE: 04.26.19 Read Drivers) [BCVA: OD: 20/30 OS: 20/50+2 Ocular Hx-Glaucoma OU, Pseudophakia OU, s/p YAP PC OS, floaters OU, diabetic retinopathy, HTN Ret, hx of iritis PMH- DM, HTN, lupus, arthritis, asthma  Previous exams by Silvestre Gunner and Blanchie Serve at [place]: Current Outpatient Medications (Ophthalmic Drugs)  Medication Sig  . brimonidine-timolol (COMBIGAN) 0.2-0.5 % ophthalmic solution Place 1 drop into the left eye daily.   . travoprost, benzalkonium, (TRAVATAN) 0.004 % ophthalmic solution Place 1 drop into the left eye at bedtime.   No current facility-administered medications for this visit.  (Ophthalmic Drugs)   Current Outpatient Medications (Other)  Medication Sig  . acetaminophen (TYLENOL) 500 MG tablet Take 1,000 mg by mouth every 6 (six) hours as needed. For pain  . albuterol (ACCUNEB) 0.63 MG/3ML nebulizer solution Take 1 ampule by nebulization every 6 (six) hours as  needed for wheezing or shortness of breath.   Marland Kitchen albuterol (PROVENTIL HFA;VENTOLIN HFA) 108 (90 BASE) MCG/ACT inhaler Inhale 2 puffs into the lungs every 6 (six) hours as needed. For shortness of breath  . Amino Acids-Protein Hydrolys (FEEDING SUPPLEMENT, PRO-STAT SUGAR FREE 64,) LIQD Take 30 mLs by mouth 2 (two) times daily. (Patient taking differently: Take 30 mLs by mouth 3 (three) times daily with meals. )  . apixaban (ELIQUIS) 5 MG TABS tablet Take 1 tablet (5 mg total) by mouth 2 (two) times daily.  Marland Kitchen atorvastatin (LIPITOR) 20 MG tablet Take 20 mg by mouth at bedtime.   . calcium acetate (PHOSLO) 667 MG capsule Take 667 mg by mouth 3 (three) times daily with meals.  . dicyclomine (BENTYL) 10 MG capsule Take 10 mg by mouth 4 (four) times daily -  before meals and at bedtime.  . febuxostat (ULORIC) 40 MG tablet Take 40 mg by mouth daily.  . folic acid (FOLVITE) 1 MG tablet Take 1 mg by mouth daily.  . hydroxychloroquine (PLAQUENIL) 200 MG tablet Take 300 mg by mouth as directed. Take 1 1/2 tablets daily  . Insulin Detemir (LEVEMIR FLEXTOUCH) 100 UNIT/ML Pen Inject 8 Units into the skin every morning.   . insulin lispro (HUMALOG KWIKPEN) 100 UNIT/ML KiwkPen Inject 5 Units into the skin 3 (three) times daily with meals as needed (*Only take if blood sugar levels are over 150).   . metoprolol tartrate (LOPRESSOR)  50 MG tablet Take 50 mg by mouth See admin instructions. Take 1 tablet twice a day, except on Tuesday, Thursday, Saturday, only take 1 tablet daily in the evening  . Multiple Vitamins-Minerals (CENTRUM SILVER PO) Take 1 tablet by mouth daily.  Marland Kitchen oxyCODONE (ROXICODONE) 5 MG immediate release tablet Take 1 tablet (5 mg total) by mouth every 4 (four) hours as needed for severe pain.  Marland Kitchen oxyCODONE-acetaminophen (PERCOCET/ROXICET) 5-325 MG tablet Take 1 tablet by mouth every 6 (six) hours as needed for severe pain.  . pantoprazole (PROTONIX) 40 MG tablet Take 1 tablet (40 mg total) by mouth daily.   . predniSONE (DELTASONE) 5 MG tablet Take 5 mg by mouth daily with breakfast.  . silver sulfADIAZINE (SILVADENE) 1 % cream Apply 1 application topically daily.   No current facility-administered medications for this visit.  (Other)      REVIEW OF SYSTEMS: ROS    Positive for: Musculoskeletal, Endocrine, Eyes   Negative for: Constitutional, Gastrointestinal, Neurological, Skin, Genitourinary, HENT, Cardiovascular, Respiratory, Psychiatric, Allergic/Imm, Heme/Lymph   Last edited by Zenovia Jordan, LPN on 12/04/3426  7:68 PM. (History)       ALLERGIES Allergies  Allergen Reactions  . Ace Inhibitors Cough    PAST MEDICAL HISTORY Past Medical History:  Diagnosis Date  . Anemia   . Arthritis   . Asthma   . Atrial fibrillation ([place])   . Atrial flutter [facility])    TEE/DCCV December 2013 - on Xarelto [facility])  . Bone spur of toe    Right 5th toe  . Chronic diastolic CHF (congestive heart failure) (HCC)    a. EF 50-55% by echo in 2015  . CKD (chronic kidney disease) stage 4, GFR 15-29 ml/min (HCC)    a. s/p fistula placement but not on HD. Followed by Nephrology.   . Colon polyposis   . COPD (chronic obstructive pulmonary disease) ([place])   . DVT of upper extremity (deep vein thrombosis) ([place])    Right basilic vein, June 1157  . Essential hypertension, benign   . Fractures   . Glaucoma   . SLE (systemic lupus erythematosus) ([place])   . Type 2 diabetes mellitus ([place])   . UTI (lower urinary tract infection)   . Wound of right leg 06/2017   Past Surgical History:  Procedure Laterality Date  . ABDOMINAL AORTOGRAM W/LOWER EXTREMITY N/A 12/18/2017   Procedure: ABDOMINAL AORTOGRAM W/LOWER EXTREMITY;  Surgeon: Elam Dutch, MD;  Location: [facility];  Service: Cardiovascular;  Laterality: N/A;  bilateral  . ABDOMINAL HYSTERECTOMY  1983  . ANKLE SURGERY  1993  . AV FISTULA PLACEMENT Left 03/27/2014   Procedure: ARTERIOVENOUS (AV) FISTULA CREATION;  Surgeon: Rosetta Posner, MD;   Location: [facility];  Service: Vascular;  Laterality: Left;  . BACK SURGERY  1980  . BASCILIC VEIN TRANSPOSITION Left 01/13/2018   Procedure: LEFT BASILIC VEIN TRANSPOSITION SECOND STAGE;  Surgeon: Elam Dutch, MD;  Location: [facility];  Service: Vascular;  Laterality: Left;  . CARDIOVASCULAR STRESS TEST  12/19/2009   no stress induced rhythm abnormalities, ekg negative for ischemia  . CARDIOVERSION  09/16/2012   Procedure: CARDIOVERSION;  Surgeon: Sanda Klein, MD;  [facility];  Service: Cardiovascular;  Laterality: N/A;  . CATARACT EXTRACTION    . COLONOSCOPY  09/20/2012   Dr. [name]: multiple tubular adenomas   . COLONOSCOPY  2005   Dr. [name]. Polyps, path unknown   . COLONOSCOPY N/A 07/24/2016   Procedure: COLONOSCOPY;  Surgeon: [name]  Rourk, MD;  [facility];  Service: Endoscopy;  Laterality: N/A;  9:00 am  . ESOPHAGOGASTRODUODENOSCOPY  09/20/2012   Dr. [name]: duodenal erosion and possible resolving ulcer at the angularis   . ESOPHAGOGASTRODUODENOSCOPY N/A 03/24/2018   Procedure: ESOPHAGOGASTRODUODENOSCOPY (EGD);  Surgeon: [name], MD;  [facility];  Service: Endoscopy;  Laterality: N/A;  12:45pm- moved up to arrive at 8:30am per Tretha Sciara  . EYE SURGERY    . IR FLUORO GUIDE CV LINE RIGHT  11/09/2017  . IR US GUIDE VASC ACCESS RIGHT  11/09/2017  . REVISON OF ARTERIOVENOUS FISTULA Left 03/15/2018   Procedure: REVISION OF Left arm BASILIC VEIN TRANSPOSITION;  Surgeon: Angelia Mould, MD;  Location: [facility];  Service: Vascular;  Laterality: Left;  . TEE WITHOUT CARDIOVERSION  09/16/2012   Procedure: TRANSESOPHAGEAL ECHOCARDIOGRAM (TEE);  Surgeon: Sanda Klein, MD;  Location: [facility] ENDOS[facility];  Service: Cardiovascular;  Laterality: N/A;  . TRANSESOPHAGEAL ECHOCARDIOGRAM WITH CARDIOVERSION  09/16/2012   EF 60-65%, moderate LVH, moderate regurg of the aortic valve, LA moderately dilated    FAMILY HISTORY Family History  Problem Relation Age  of Onset  . Diabetes Brother   . Diabetes Father   . Hypertension Father   . Hypertension Sister   . Stroke Mother   . Diabetes Sister   . Hypertension Brother   . Colon cancer Neg Hx     SOCIAL HISTORY Social History   Tobacco Use  . Smoking status: Former Smoker    Packs/day: 1.00    Years: 30.00    Pack years: 30.00    Types: Cigarettes    Last attempt to quit: 08/29/2012    Years since quitting: 5.7  . Smokeless tobacco: Never Used  . Tobacco comment: quit 08-2012  Substance Use Topics  . Alcohol use: No    Alcohol/week: 0.0 standard drinks  . Drug use: No         OPHTHALMIC EXAM:  Base Eye Exam    Visual Acuity (Snellen - Linear)      Right Left   Dist [place] 20/70 20/100   Dist ph [place] NI NI       Tonometry (Tonopen, 1:31 PM)      Right Left   Pressure 17 18       Pupils      Dark Light Shape React APD   Right 5 4 Round Minimal None   Left 5 4 Round Minimal None       Visual Fields (Counting fingers)      Left Right    Full Full       Extraocular Movement      Right Left    Full, Ortho Full, Ortho       Neuro/Psych    Oriented x3:  Yes   Mood/Affect:  Normal       Dilation    Both eyes:  1.0% Mydriacyl, 2.5% Phenylephrine @ 1:31 PM        Slit Lamp and Fundus Exam    Slit Lamp Exam      Right Left   Lids/Lashes Dermatochalasis - upper lid, mild Meibomian gland dysfunction Dermatochalasis - upper lid, mild Meibomian gland dysfunction   Conjunctiva/Sclera White and quiet White and quiet   Cornea Arcus Arcus   Anterior Chamber Deep and quiet Deep and quiet   Iris Round and dilated, No NVI Round and dilated, No NVI   Lens PC IOL in good position three piece sulcus IOL with optic  capture, open PC   Vitreous Vitreous syneresis empty - ?post vitrectomy       Fundus Exam      Right Left   Disc Pink and Sharp, no NVD Pink and Sharp, mild Pallor   C/D Ratio 0.2 0.25   Macula Blunted foveal reflex, Retinal pigment epithelial mottling, focal  laser scars temporal to fovea, scattered Microaneurysms Good foveal reflex, Retinal pigment epithelial mottling, focal laser scars, rare Microaneurysms, mild Epiretinal membrane   Vessels Vascular attenuation, Tortuous Vascular attenuation, Tortuous, AV crossing changes   Periphery Attached, scattered DBH Attached, scattered DBH          IMAGING AND PROCEDURES  Imaging and Procedures for 02/01/18  OCT, Retina - OU - Both Eyes       Right Eye Quality was good. Central Foveal Thickness: 265. Progression has been stable. Findings include abnormal foveal contour, no SRF, outer retinal atrophy, epiretinal membrane, vitreomacular adhesion , intraretinal fluid (Trace cystic changes associated with VMA, focal areas of ORA).   Left Eye Quality was good. Central Foveal Thickness: 196. Progression has improved. Findings include abnormal foveal contour, epiretinal membrane, no SRF, outer retinal atrophy, no IRF (Interval improvement in IRF and trace cystic changes).   Notes *Images captured and stored on drive  Diagnosis / Impression:  OD: No DME, mild VMA with trace cystic changes OS: Interval improvement in IRF/DME and ERM with abnormal contour  Clinical management:  See below  Abbreviations: NFP - Normal foveal profile. CME - cystoid macular edema. PED - pigment epithelial detachment. IRF - intraretinal fluid. SRF - subretinal fluid. EZ - ellipsoid zone. ERM - epiretinal membrane. ORA - outer retinal atrophy. ORT - outer retinal tubulation. SRHM - subretinal hyper-reflective material         Fluorescein Angiography Optos (Transit OS)       Right Eye   Progression has no prior data. Early phase findings include staining, retinal neovascularization, microaneurysm, vascular perfusion defect. Mid/Late phase findings include staining, leakage, microaneurysm, retinal neovascularization, vascular perfusion defect.   Left Eye   Progression has no prior data. Early phase findings  include microaneurysm, vascular perfusion defect, staining, window defect. Mid/Late phase findings include leakage, staining, window defect, microaneurysm, vascular perfusion defect.   Notes Images captured and stored on drive;   Impression: OD: PDR OS: Severe NPDR         Panretinal Photocoagulation - OD - Right Eye       LASER PROCEDURE NOTE  Diagnosis:   Proliferative Diabetic Retinopathy, RIGHT EYE  Procedure:  Pan-retinal photocoagulation using slit lamp laser, RIGHT EYE  Anesthesia:  Topical  Surgeon: Bernarda Caffey, MD, PhD   Informed consent obtained, operative eye marked, and time out performed prior to initiation of laser.   Lumenis [id number] slit lamp laser Pattern: 3x3 square Power: 250 mW Duration: 30 msec  Spot size: 200 microns  # spots: 2993 spots   Complications: None.  RTC: 2-4 wks for PRP, contralateral eye  Patient tolerated the procedure well and received written and verbal post-procedure care information/education.                  ASSESSMENT/PLAN:    ICD-10-CM   1. Proliferative diabetic retinopathy of right eye without macular edema associated with type 2 diabetes mellitus (HCC) Z16.9678 Fluorescein Angiography Optos (Transit OS)    Panretinal Photocoagulation - OD - Right Eye  2. Severe nonproliferative diabetic retinopathy of left eye without macular edema associated with type 2 diabetes mellitus ([place]) L38.1017 OCT,  Retina - OU - Both Eyes    Fluorescein Angiography Optos (Transit OS)  3. Retinal edema H35.81 OCT, Retina - OU - Both Eyes  4. Epiretinal membrane (ERM) of left eye H35.372   5. Pseudophakia of both eyes Z96.1     1. PDR w/o macular edema OD 2. Severe NPDR w/o macular edema OS - former pt of Drs. Cordelia Pen and Kurup at [facility] - s/p focal laser and intravitreal injections - exam shows scattered [place] OU -- no NV - OCT without diabetic macular edema OD, mild noncentral edema OS - FA today  shows leaking NV OD, scattered peripheral capillary nonperfusion OS - recommend PRP OD today (08.09.19) - pt wishes to proceed - RBA of procedure discussed, questions answered - informed consent obtained and signed - see procedure note - f/u in 2-4 weeks, possible PRP OS for peripheral nonperfusion  3. Epiretinal membrane, left eye  The natural history, anatomy, potential for loss of vision, and treatment options including vitrectomy techniques and the complications of endophthalmitis, retinal detachment, vitreous hemorrhage, cataract progression and permanent vision loss discussed with the patient. - mild ERM - not visually significant - no surgical intervention indicated at this time - monitor  4. Pseudophakia OU  - s/p CE/IOL OU Herbert Deaner)  - history of complex cataract surgery OS with retained lens fragment  - ?history of PPV/PPL OS w/ Kurup -- will try to obtain records from [facility]  - doing well -- PCIOLs in good position and stable  - monitor    Ophthalmic Meds Ordered this visit:  No orders of the defined types were placed in this encounter.      Return in about 4 weeks (around 06/04/2018) for F/U NPDR OU, DFE, OCT.  There are no Patient Instructions on file for this visit.   Explained the diagnoses, plan, and follow up with the patient and they expressed understanding.  Patient expressed understanding of the importance of proper follow up care.   This document serves as a record of services personally performed by Gardiner Sleeper, MD, PhD. It was created on their behalf by Catha Brow, [place], a certified ophthalmic assistant. The creation of this record is the provider's dictation and/or activities during the visit.  Electronically signed by: Catha Brow, COA  08.08.19 5:13 PM   Gardiner Sleeper, M.D., Ph.D. Diseases & Surgery of the Retina and Vitreous [facility]   I have reviewed the above documentation for accuracy and  completeness, and I agree with the above. Gardiner Sleeper, M.D., Ph.D. 05/11/18 5:13 PM    Abbreviations: M myopia (nearsighted); A astigmatism; H hyperopia (farsighted); P presbyopia; Mrx spectacle prescription;  CTL contact lenses; OD right eye; OS left eye; OU both eyes  XT exotropia; ET esotropia; PEK punctate epithelial keratitis; PEE punctate epithelial erosions; DES dry eye syndrome; MGD meibomian gland dysfunction; ATs artificial tears; PFAT's preservative free artificial tears; [place] nuclear sclerotic cataract; PSC posterior subcapsular cataract; ERM epi-retinal membrane; PVD posterior vitreous detachment; RD retinal detachment; DM diabetes mellitus; DR diabetic retinopathy; NPDR non-proliferative diabetic retinopathy; PDR proliferative diabetic retinopathy; CSME clinically significant macular edema; DME diabetic macular edema; dbh dot blot hemorrhages; CWS cotton wool spot; POAG primary open angle glaucoma; C/D cup-to-disc ratio; HVF humphrey visual field; GVF goldmann visual field; OCT optical coherence tomography; IOP intraocular pressure; BRVO Branch retinal vein occlusion; CRVO central retinal vein occlusion; CRAO central retinal artery occlusion; BRAO branch retinal artery occlusion; RT  retinal tear; SB scleral buckle; PPV pars plana vitrectomy; VH Vitreous hemorrhage; PRP panretinal laser photocoagulation; IVK intravitreal kenalog; VMT vitreomacular traction; MH Macular hole;  NVD neovascularization of the disc; NVE neovascularization elsewhere; AREDS age related eye disease study; ARMD age related macular degeneration; POAG primary open angle glaucoma; EBMD epithelial/anterior basement membrane dystrophy; ACIOL anterior chamber intraocular lens; IOL intraocular lens; PCIOL posterior chamber intraocular lens; Phaco/IOL phacoemulsification with intraocular lens placement; [place] photorefractive keratectomy; LASIK laser assisted in situ keratomileusis; HTN hypertension; DM diabetes mellitus; COPD  chronic obstructive pulmonary disease

## 2018-05-07 ENCOUNTER — Ambulatory Visit (INDEPENDENT_AMBULATORY_CARE_PROVIDER_SITE_OTHER): Payer: Medicare Other | Admitting: Ophthalmology

## 2018-05-07 ENCOUNTER — Encounter (INDEPENDENT_AMBULATORY_CARE_PROVIDER_SITE_OTHER): Payer: Self-pay | Admitting: Ophthalmology

## 2018-05-07 DIAGNOSIS — H3581 Retinal edema: Secondary | ICD-10-CM

## 2018-05-07 DIAGNOSIS — H35372 Puckering of macula, left eye: Secondary | ICD-10-CM | POA: Diagnosis not present

## 2018-05-07 DIAGNOSIS — Z961 Presence of intraocular lens: Secondary | ICD-10-CM

## 2018-05-07 DIAGNOSIS — E113591 Type 2 diabetes mellitus with proliferative diabetic retinopathy without macular edema, right eye: Secondary | ICD-10-CM

## 2018-05-07 DIAGNOSIS — E113492 Type 2 diabetes mellitus with severe nonproliferative diabetic retinopathy without macular edema, left eye: Secondary | ICD-10-CM | POA: Diagnosis not present

## 2018-05-08 DIAGNOSIS — N2581 Secondary hyperparathyroidism of renal origin: Secondary | ICD-10-CM | POA: Diagnosis not present

## 2018-05-08 DIAGNOSIS — D631 Anemia in chronic kidney disease: Secondary | ICD-10-CM | POA: Diagnosis not present

## 2018-05-08 DIAGNOSIS — N186 End stage renal disease: Secondary | ICD-10-CM | POA: Diagnosis not present

## 2018-05-08 DIAGNOSIS — D689 Coagulation defect, unspecified: Secondary | ICD-10-CM | POA: Diagnosis not present

## 2018-05-08 DIAGNOSIS — D509 Iron deficiency anemia, unspecified: Secondary | ICD-10-CM | POA: Diagnosis not present

## 2018-05-08 DIAGNOSIS — E1129 Type 2 diabetes mellitus with other diabetic kidney complication: Secondary | ICD-10-CM | POA: Diagnosis not present

## 2018-05-08 DIAGNOSIS — E876 Hypokalemia: Secondary | ICD-10-CM | POA: Diagnosis not present

## 2018-05-09 NOTE — Progress Notes (Signed)
[name], [name] ([id number]) Visit Report for 04/28/2018 HPI Details Patient Name: [name], [name]. Date of Service: 04/28/2018 1:00 PM Medical Record Number: [id number] Patient Account Number: [id number] Date of Birth/Sex: 1946/08/12 (72 y.o. F) Treating RN: Cornell Barman Primary Care Provider: Sharilyn Sites Other Clinician: Referring Provider: Sharilyn Sites Treating Provider/Extender: Tito Dine in Treatment: 7 History of Present Illness HPI Description: ADMISSION 03/10/18; This is a 72 year old woman with a history of end-stage renal disease on hemodialysis, type 2 diabetes on insulin with excellent control, systemic lupus. Her problem began in October 2018 she developed a blister in an open wound on the right anterior tibial mid tibial area. This progressed and worsened until the tissue loss was substantial on the anterior and posterior right calf. she was followed in [place] by physical therapy at one point although I have not reviewed their notes. She apparently did not receive pulse lavage to the wound. I note that she was hospitalized from 03/01/18 through 03/03/18 predominantly for debridement of the wound by general surgery. She saw Dr. Arnoldo Morale who did not believe there was a role for surgical debridement. Was felt that definitive treatment would be amputation but the patient already been offered this by vein and vascular in [facility] and she refused arrangements were made for her to be sent to this clinic for review. The patient is already had a reasonably substantial review for this problem. she saw vein and vascular extensively over the course of this year.her noninvasive arterial studies done in December 2018 suggested a right superficial femoral artery occlusion/stenosis. She did not have any issues on the left. At that point her ABI on the right was 0.8 with monophasic waveforms 1.06 on the left. However she had a velocity drop off in the superficial femoral  artery. She went on to have diagnostic angiogram on 12/18/17 by Dr. Oneida Alar. This showed on the right that the common femoral, profunda femoris and superficial femoral arteries were all patent. There was a focal less than 1 mm length stenosis on the right superficial femoral artery otherwise the artery was widely patent. The right popliteal artery was widely patent. There was 3 vessel runoff via the anterior, tibial and paranoid Milta Deiters arteries to the right foot. She was not felt to have a vascular cause of these wounds. 04/28/18; really no progress at all. Fact the area of the right anterior leg is worse more necrotic tissue. Also more exposed tendon on the right posterior leg and I think the left leg is developing same thing. Her calf looks increasingly pale and almost mottled looking. She has had breakdown on her toes. The patient is seen her rheumatologist that [facility]. From this point of view I would wonder about antiphospholipid syndrome oShe is also seeing Dr. Shanon Rosser of vascular in [place]. By discussion with her husband I don't think Dr. Doren Custard felt that anything further was to be done even given the breakdown on the left leg. Also if I can review his notes to that effect oShe is seeing Dr. Lillia Carmel of [facility] dermatology this afternoon I am hopeful that their pathologist will give [place] a diagnosis. I am increasingly worried that we are on her way to a similar situation in the left sHe's also had 2 separate biopsies. Firstly on 3/25 showed nonspecific findings with calciphylaxis not entirely excluded. No definite calcium deposits were seen within the dermis within the sections reviewed. Vascular features were nonspecific. Furthermore pyogenic granuloma may demonstrate similar findings. It looks as though  she had a second biopsy on 12/24/17 this showed stasis changes with lipoma membranous panniculitis and arterial vascular insufficiency. Calciphylaxis was not favored in  this specimen. There was a calcified vessel. I don't see a pathology specimen that specifically says definitive calciphylaxis. The patient had an x-ray of her right tibia while she is in hospital there was no osteomyelitis. She had an MRI of her foot in March that did not show osteomyelitis as well The patient has been using Betadine and wraps to the wounds. This is being done by a home health where they live in [place], [place]. ([id number]) 03/17/18; patient admitted to clinic last week. 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 Considerable amount of necrotic surface material that I removed from the posterior calf last week and again this week. The area anteriorly is far too adherent at this point. She lives in [place]. She has advanced home care home health. Dialyzes in [place] as well. 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 There is very apparent necrotic material in the anterior side. We've been using Santyl anteriorly and silver alginate posteriorly. 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 I've gotten her an appointment at [facility] dermatology next week hopefully to have the original biopsy sites reviewed. The patient is still in a lot of pain. She is not straightening the leg out. She had an angiogram that did not suggest coexistent macrovascular disease 04/07/18; absolutely no improvement in this lady's condition. Extensive wounds on the right anterior covered in necrotic surface and the right posterior calf. There is better looking tissue posteriorly on the right over there is a large exposed tendon. In addition today she seems to develop some wounds on her toes on the left foot which were new this week especially the left third toe most circumferential loss. This unfortunate woman is in a lot of pain and I once again I've talked to her about  an amputation of the right leg which she doesn't want to consider. 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 She has a large necrotic covered wound on the right anterior tibia oLarge full-thickness wound on the right posterior tibia with healthier looking tissue but a large exposed piece of tendon oNecrotic wound on the right lateral heel oThe new problem this week is wounds on her toes 04/14/18; this patient's overall condition in the right leg is unchanged she has 3 open areas of large necrotic area anteriorly. Better-looking surface but exposed tendon in the large wound posteriorly and an area on her right lateral calcaneus oShe has new wounds this week on the left first second and third toes Although these all look vascular previous arteriogram on the right did not show an obvious macrovascular etiology. This does not rule out a Small vesselvasculopathy. I have her being followed by Dr. Lillia Carmel at [facility] dermatology to see if they can review the slides already done to see if we can establish a specific etiology i.e. calciphylaxis, antiphospholipid syndrome [patient has SLE]; or some other cause of this. I have not wanted to attempt to rebiopsy this in our clinic leaving that to the academic dermatologist's. I note she is now on Levaquin I can see in [place] that the dermatologist culture something that was Pseudomonas. I'm not sure what they cultured. Electronic Signature(s) Signed: 04/28/2018 6:17:43 PM By: Linton Ham  MD Entered By: Linton Ham on 04/28/2018 15:01:59 [name] ([id number]) -------------------------------------------------------------------------------- Physical Exam Details Patient Name: [name], [name]. Date of Service: 04/28/2018 1:00 PM Medical Record Number: [id number] Patient Account Number: [id number] Date of Birth/Sex: 1946/01/01 (72 y.o. F) Treating RN: Cornell Barman Primary Care Provider: Sharilyn Sites Other Clinician: Referring Provider: Sharilyn Sites Treating Provider/Extender: Tito Dine in Treatment: 7 Constitutional Sitting or standing Blood Pressure is within target range for patient.. Pulse regular and within target range for patient.Marland Kitchen Respirations regular, non-labored and within target range.. Temperature is normal and within the target range for the patient.Marland Kitchen appears in no distress. Respiratory Respiratory effort is easy and symmetric bilaterally. Rate is normal at rest and on room air.. Cardiovascular I am able to feel dorsalis pedis pulses bilaterally. Gastrointestinal (GI) Abdomen is soft and non-distended without masses or tenderness. Bowel sounds active in all quadrants.. No liver or spleen enlargement or tenderness.. Lymphatic none palpable in the popliteal or inguinal area. Integumentary (Hair, Skin) concerned about the skin in the left calf which has a pale and somewhat dusky threatened look. Psychiatric Patient appears depressed today.however she appears to be at baseline. Notes wound exam; oOn the right anterior leg but worsening necrotic surface superiorly and inferiorly. We are making no progress with central oOn the right posterior leg there is more exposed tendon suggesting deterioration oSmall wound on the right lateral heel using Santyl to this area. oWounds on the left first second and third toes are painful using silver alginate Electronic Signature(s) Signed: 04/28/2018 6:17:43 PM By: Linton Ham MD Entered By: Linton Ham on 04/28/2018 15:04:11 [name] ([id number]) -------------------------------------------------------------------------------- Physician Orders Details Patient Name: [name]. Date of Service: 04/28/2018 1:00 PM Medical Record Number: [id number] Patient Account Number: [id number] Date of Birth/Sex: 07/15/1946 (72 y.o. F) Treating RN: Cornell Barman Primary Care Provider: Sharilyn Sites Other Clinician: Referring Provider: Sharilyn Sites Treating Provider/Extender: Tito Dine in Treatment: 7 Verbal / Phone Orders: No Diagnosis Coding Wound Cleansing Wound #1 Right,Anterior Lower Leg o Clean wound with Normal Saline. Anesthetic (add to Medication List) Wound #1 Right,Anterior Lower Leg o Topical Lidocaine 4% cream applied to wound bed prior to debridement (In Clinic Only). Wound #2 Right,Posterior Lower Leg o Topical Lidocaine 4% cream applied to wound bed prior to debridement (In Clinic Only). Wound #3 Right,Lateral Calcaneus o Topical Lidocaine 4% cream applied to wound bed prior to debridement (In Clinic Only). Wound #4 Left,Dorsal Metatarsal head first o Topical Lidocaine 4% cream applied to wound bed prior to debridement (In Clinic Only). Wound #5 Left Toe Third o Topical Lidocaine 4% cream applied to wound bed prior to debridement (In Clinic Only). Wound #6 Left Toe - Web between 2nd and 3rd o Topical Lidocaine 4% cream applied to wound bed prior to debridement (In Clinic Only). Wound #7 Left Toe - Web between 3rd and 4th o Topical Lidocaine 4% cream applied to wound bed prior to debridement (In Clinic Only). Wound #8 Dorsal Toe Great o Topical Lidocaine 4% cream applied to wound bed prior to debridement (In Clinic Only). Wound #9 Right Toe - Web between 2nd and 3rd o Topical Lidocaine 4% cream applied to wound bed prior to debridement (In Clinic Only). Primary Wound Dressing Wound #1 Right,Anterior Lower Leg o Santyl Ointment Wound #2 Right,Posterior Lower Leg o Mepitel One Contact layer - Hydrogel covered by contact layer over tendon to keep moist. Wound #3 Right,Lateral Calcaneus o Silver Alginate Wound #4 Left,Dorsal Metatarsal head  first o Silver Alginate CRUCITA, LACORTE F. ([id number]) Wound #5 Left Toe Third o Silver Alginate Wound #6 Left Toe - Web between 2nd  and 3rd o Silver Alginate Wound #7 Left Toe - Web between 3rd and 4th o Silver Alginate Wound #8 Dorsal Toe Great o Silver Alginate Wound #9 Right Toe - Web between 2nd and 3rd o Silver Alginate Secondary Dressing Wound #1 Right,Anterior Lower Leg o ABD pad Wound #2 Right,Posterior Lower Leg o ABD pad Wound #3 Right,Lateral Calcaneus o ABD pad Wound #4 Left,Dorsal Metatarsal head first o ABD pad Wound #5 Left Toe Third o ABD pad Wound #6 Left Toe - Web between 2nd and 3rd o ABD pad Wound #7 Left Toe - Web between 3rd and 4th o ABD pad Wound #8 Dorsal Toe Great o ABD pad Wound #9 Right Toe - Web between 2nd and 3rd o ABD pad Dressing Change Frequency Wound #1 Right,Anterior Lower Leg o Change Dressing Monday, Wednesday, Friday - HHRN to change on Friday and Monday Wound #2 Right,Posterior Lower Leg o Change Dressing Monday, Wednesday, Friday - HHRN to change on Friday and Monday Wound #3 Right,Lateral Calcaneus o Change Dressing Monday, Wednesday, Friday - HHRN to change on Friday and Monday Wound #4 Left,Dorsal Metatarsal head first o Change Dressing Monday, Wednesday, Friday - HHRN to change on Friday and Monday KALEIGH, SPIEGELMAN. ([id number]) Wound #5 Left Toe Third o Change Dressing Monday, Wednesday, Friday - HHRN to change on Friday and Monday Wound #6 Left Toe - Web between 2nd and 3rd o Change Dressing Monday, Wednesday, Friday - HHRN to change on Friday and Monday Wound #7 Left Toe - Web between 3rd and 4th o Change Dressing Monday, Wednesday, Friday - HHRN to change on Friday and Monday Wound #8 Dorsal Toe Great o Change Dressing Monday, Wednesday, Friday - HHRN to change on Friday and Monday Wound #9 Right Toe - Web between 2nd and 3rd o Change Dressing Monday, Wednesday, Friday - HHRN to change on Friday and Monday Follow-up Appointments Wound #1 Right,Anterior Lower Leg o Return Appointment in 2 weeks. Wound  #2 Right,Posterior Lower Leg o Return Appointment in 2 weeks. Wound #3 Right,Lateral Calcaneus o Return Appointment in 2 weeks. Wound #4 Left,Dorsal Metatarsal head first o Return Appointment in 2 weeks. Wound #5 Left Toe Third o Return Appointment in 2 weeks. Wound #6 Left Toe - Web between 2nd and 3rd o Return Appointment in 2 weeks. Wound #7 Left Toe - Web between 3rd and 4th o Return Appointment in 2 weeks. Wound #8 Dorsal Toe Great o Return Appointment in 2 weeks. Wound #9 Right Toe - Web between 2nd and 3rd o Return Appointment in 2 weeks. Edema Control Wound #1 Right,Anterior Lower Leg o Kerlix and Coban - Right Lower Extremity Wound #2 Right,Posterior Lower Leg o Kerlix and [place]an - Right Lower Extremity Wound #3 Right,Lateral Calcaneus o Kerlix and [place]an - Right Lower Extremity Home Health Wound #1 Right,Anterior Lower Leg ESTRELLA, ALCARAZ ([id number]) o [place] Nurse may visit PRN to address patientos wound care needs. o FACE TO FACE ENCOUNTER: MEDICARE and MEDICAID PATIENTS: I certify that this patient is under my care and that I had a face-to-face encounter that meets the physician face-to-face encounter requirements with this patient on this date. The encounter with the patient was in whole or in part for the following MEDICAL CONDITION: (primary reason for [place]) MEDICAL NECESSITY: I certify, that based on my findings,  NURSING services are a medically necessary home health service. HOME BOUND STATUS: I certify that my clinical findings support that this patient is homebound (i.e., Due to illness or injury, pt requires aid of supportive devices such as crutches, cane, wheelchairs, walkers, the use of special transportation or the assistance of another person to leave their place of residence. There is a normal inability to leave the home and doing so requires considerable and taxing effort.  Other absences are for medical reasons / religious services and are infrequent or of short duration when for other reasons). o If current dressing causes regression in wound condition, may D/C ordered dressing product/s and apply Normal Saline Moist Dressing daily until next [place] / Other MD appointment. [place] of regression in wound condition at [phone]. o Please direct any NON-WOUND related issues/requests for orders to patient's Primary Care Physician Wound #2 [place] Nurse may visit PRN to address patientos wound care needs. o FACE TO FACE ENCOUNTER: MEDICARE and MEDICAID PATIENTS: I certify that this patient is under my care and that I had a face-to-face encounter that meets the physician face-to-face encounter requirements with this patient on this date. The encounter with the patient was in whole or in part for the following MEDICAL CONDITION: (primary reason for [place]) MEDICAL NECESSITY: I certify, that based on my findings, NURSING services are a medically necessary home health service. HOME BOUND STATUS: I certify that my clinical findings support that this patient is homebound (i.e., Due to illness or injury, pt requires aid of supportive devices such as crutches, cane, wheelchairs, walkers, the use of special transportation or the assistance of another person to leave their place of residence. There is a normal inability to leave the home and doing so requires considerable and taxing effort. Other absences are for medical reasons / religious services and are infrequent or of short duration when for other reasons). o If current dressing causes regression in wound condition, may D/C ordered dressing product/s and apply Normal Saline Moist Dressing daily until next [place] / Other MD appointment. [place] of regression in wound  condition at [phone]. o Please direct any NON-WOUND related issues/requests for orders to patient's Primary Care Physician Wound #3 [place] Visits o Home Health Nurse may visit PRN to address patientos wound care needs. o FACE TO FACE ENCOUNTER: MEDICARE and MEDICAID PATIENTS: I certify that this patient is under my care and that I had a face-to-face encounter that meets the physician face-to-face encounter requirements with this patient on this date. The encounter with the patient was in whole or in part for the following MEDICAL CONDITION: (primary reason for [place]) MEDICAL NECESSITY: I certify, that based on my findings, NURSING services are a medically necessary home health service. HOME BOUND STATUS: I certify that my clinical findings support that this patient is homebound (i.e., Due to illness or injury, pt requires aid of supportive devices such as crutches, cane, wheelchairs, walkers, the use of special transportation or the assistance of another person to leave their place of residence. There is a normal inability to leave the home and doing so requires considerable and taxing effort. Other absences are for medical reasons / religious services and are infrequent or of short duration when for other reasons). o If current dressing causes regression in wound condition, may D/C ordered dressing product/s and apply Normal Saline Moist  Dressing daily until next [place] / Other MD appointment. [place] of regression in wound condition at [phone]. o Please direct any NON-WOUND related issues/requests for orders to patient's Primary Care Physician Wound #4 Left,Dorsal Metatarsal head first o [place] Visits o Home Health Nurse may visit PRN to address patientos wound care needs. o FACE TO FACE ENCOUNTER: MEDICARE and MEDICAID PATIENTS: I certify that this patient is under my  care and that I had a face-to-face encounter that meets the physician face-to-face encounter requirements with this [name], [name]. ([id number]) patient on this date. The encounter with the patient was in whole or in part for the following MEDICAL CONDITION: (primary reason for [place]) MEDICAL NECESSITY: I certify, that based on my findings, NURSING services are a medically necessary home health service. HOME BOUND STATUS: I certify that my clinical findings support that this patient is homebound (i.e., Due to illness or injury, pt requires aid of supportive devices such as crutches, cane, wheelchairs, walkers, the use of special transportation or the assistance of another person to leave their place of residence. There is a normal inability to leave the home and doing so requires considerable and taxing effort. Other absences are for medical reasons / religious services and are infrequent or of short duration when for other reasons). o If current dressing causes regression in wound condition, may D/C ordered dressing product/s and apply Normal Saline Moist Dressing daily until next [place] / Other MD appointment. [place] of regression in wound condition at [phone]. o Please direct any NON-WOUND related issues/requests for orders to patient's Primary Care Physician Wound #5 Left Toe [place] Nurse may visit PRN to address patientos wound care needs. o FACE TO FACE ENCOUNTER: MEDICARE and MEDICAID PATIENTS: I certify that this patient is under my care and that I had a face-to-face encounter that meets the physician face-to-face encounter requirements with this patient on this date. The encounter with the patient was in whole or in part for the following MEDICAL CONDITION: (primary reason for [place]) MEDICAL NECESSITY: I certify, that based on my findings, NURSING services are a  medically necessary home health service. HOME BOUND STATUS: I certify that my clinical findings support that this patient is homebound (i.e., Due to illness or injury, pt requires aid of supportive devices such as crutches, cane, wheelchairs, walkers, the use of special transportation or the assistance of another person to leave their place of residence. There is a normal inability to leave the home and doing so requires considerable and taxing effort. Other absences are for medical reasons / religious services and are infrequent or of short duration when for other reasons). o If current dressing causes regression in wound condition, may D/C ordered dressing product/s and apply Normal Saline Moist Dressing daily until next [place] / Other MD appointment. [place] of regression in wound condition at [phone]. o Please direct any NON-WOUND related issues/requests for orders to patient's Primary Care Physician Wound #6 Left Toe - Web between 2nd and [place] Nurse may visit PRN to address patientos wound care needs. o FACE TO FACE ENCOUNTER: MEDICARE and MEDICAID PATIENTS: I certify that this patient is under my care and that I had a face-to-face encounter that meets the physician face-to-face encounter requirements with this patient on this date. The encounter with the patient was in  whole or in part for the following MEDICAL CONDITION: (primary reason for Home Healthcare) MEDICAL NECESSITY: I certify, that based on my findings, NURSING services are a medically necessary home health service. HOME BOUND STATUS: I certify that my clinical findings support that this patient is homebound (i.e., Due to illness or injury, pt requires aid of supportive devices such as crutches, cane, wheelchairs, walkers, the use of special transportation or the assistance of another person to leave their place of residence. There is a  normal inability to leave the home and doing so requires considerable and taxing effort. Other absences are for medical reasons / religious services and are infrequent or of short duration when for other reasons). o If current dressing causes regression in wound condition, may D/C ordered dressing product/s and apply Normal Saline Moist Dressing daily until next [place] / Other MD appointment. [place] of regression in wound condition at [phone]. o Please direct any NON-WOUND related issues/requests for orders to patient's Primary Care Physician Wound #7 Left Toe - Web between 3rd and [place] Nurse may visit PRN to address patientos wound care needs. o FACE TO FACE ENCOUNTER: MEDICARE and MEDICAID PATIENTS: I certify that this patient is under my care and that I had a face-to-face encounter that meets the physician face-to-face encounter requirements with this patient on this date. The encounter with the patient was in whole or in part for the following MEDICAL CONDITION: (primary reason for [place]) MEDICAL NECESSITY: I certify, that based on my findings, NURSING services are a medically necessary home health service. HOME BOUND STATUS: I certify that my clinical findings support that this patient is homebound (i.e., Due to illness or injury, pt requires aid of supportive devices such as crutches, cane, wheelchairs, walkers, the use of special transportation or the [place]. ([id number]) assistance of another person to leave their place of residence. There is a normal inability to leave the home and doing so requires considerable and taxing effort. Other absences are for medical reasons / religious services and are infrequent or of short duration when for other reasons). o If current dressing causes regression in wound condition, may D/C ordered dressing product/s and apply Normal Saline  Moist Dressing daily until next [place] / Other MD appointment. [place] of regression in wound condition at [phone]. o Please direct any NON-WOUND related issues/requests for orders to patient's Primary Care Physician Wound #8 Dorsal Toe [place] Visits o Home Health Nurse may visit PRN to address patientos wound care needs. o FACE TO FACE ENCOUNTER: MEDICARE and MEDICAID PATIENTS: I certify that this patient is under my care and that I had a face-to-face encounter that meets the physician face-to-face encounter requirements with this patient on this date. The encounter with the patient was in whole or in part for the following MEDICAL CONDITION: (primary reason for [place]) MEDICAL NECESSITY: I certify, that based on my findings, NURSING services are a medically necessary home health service. HOME BOUND STATUS: I certify that my clinical findings support that this patient is homebound (i.e., Due to illness or injury, pt requires aid of supportive devices such as crutches, cane, wheelchairs, walkers, the use of special transportation or the assistance of another person to leave their place of residence. There is a normal inability to leave the home and doing so requires considerable and taxing effort. Other absences are for medical reasons /  religious services and are infrequent or of short duration when for other reasons). o If current dressing causes regression in wound condition, may D/C ordered dressing product/s and apply Normal Saline Moist Dressing daily until next [place] / Other MD appointment. [place] of regression in wound condition at [phone]. o Please direct any NON-WOUND related issues/requests for orders to patient's Primary Care Physician Wound #9 Right Toe - Web between 2nd and [place] Nurse may visit PRN to address  patientos wound care needs. o FACE TO FACE ENCOUNTER: MEDICARE and MEDICAID PATIENTS: I certify that this patient is under my care and that I had a face-to-face encounter that meets the physician face-to-face encounter requirements with this patient on this date. The encounter with the patient was in whole or in part for the following MEDICAL CONDITION: (primary reason for [place]) MEDICAL NECESSITY: I certify, that based on my findings, NURSING services are a medically necessary home health service. HOME BOUND STATUS: I certify that my clinical findings support that this patient is homebound (i.e., Due to illness or injury, pt requires aid of supportive devices such as crutches, cane, wheelchairs, walkers, the use of special transportation or the assistance of another person to leave their place of residence. There is a normal inability to leave the home and doing so requires considerable and taxing effort. Other absences are for medical reasons / religious services and are infrequent or of short duration when for other reasons). o If current dressing causes regression in wound condition, may D/C ordered dressing product/s and apply Normal Saline Moist Dressing daily until next [place] / Other MD appointment. [place] of regression in wound condition at [phone]. o Please direct any NON-WOUND related issues/requests for orders to patient's Primary Care Physician Electronic Signature(s) Signed: 04/28/2018 6:17:43 PM By: Linton Ham MD Signed: 04/30/2018 6:17:48 PM By: Gretta Cool, BSN, RN, CWS, Kim RN, BSN Entered By: Gretta Cool, BSN, RN, CWS, Kim on 04/28/2018 14:11:35 [name] ([id number]) -------------------------------------------------------------------------------- Problem List Details Patient Name: [name], [name]. Date of Service: 04/28/2018 1:00 PM Medical Record Number: [id number] Patient Account Number: [id number] Date of  Birth/Sex: 1946/01/26 (72 y.o. 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 at this point I'm More necrotic tissue. The area on the back with exposed going to change to Medical tendon is about the same. 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 Hopefully Decision they have her slides Making : from late March 2 biopsy sites. E11.622 Type 2 diabetes mellitus with other skin ulcer 03/10/2018 No Yes L89.610 Pressure ulcer of right heel, unstageable 03/10/2018 Yes Yes Status Complications Interventions Medical No necrotic surface using Santyl Decision change Making : L97.529 Non-pressure chronic ulcer of other part of left foot with 04/15/2018 Yes Yes unspecified severity Status Complications Interventions Medical Other This is new this week. 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 Date of Service: 04/28/2018 1:00 PM Medical Record Number: [id number] Patient Account Number: [id number] Date of Birth/Sex: Sep 23, 1946 (72 y.o. F) Treating RN: Cornell Barman Primary Care Provider: Sharilyn Sites Other Clinician: Referring Provider: Sharilyn Sites Treating Provider/Extender: Tito Dine in Treatment: 7 Subjective History of Present Illness (HPI) ADMISSION 03/10/18; This is a 72 year old woman with a history of end-stage renal disease on hemodialysis, type 2 diabetes on insulin with excellent control, systemic lupus. Her problem began in October 2018 she developed a blister in an open wound on the right anterior tibial mid tibial area. This progressed and worsened until the tissue loss was substantial on the anterior and posterior right calf. she was followed in [place] by physical therapy at one point although I have not reviewed their notes. She apparently did not receive pulse lavage to the wound. I note that she was hospitalized from 03/01/18 through 03/03/18 predominantly for debridement of the wound by general surgery. She saw Dr. Arnoldo Morale who did not believe there was a role for surgical debridement. Was felt that definitive treatment would be amputation but the patient already been offered this by vein and vascular in [facility] and she refused arrangements were made for her to be sent to this clinic for review. The patient is already had a reasonably substantial review for this problem. she saw vein and vascular extensively over the course of this year.her noninvasive arterial studies done in December 2018 suggested a right superficial femoral artery occlusion/stenosis. She did not have any issues on the left. At that point her ABI on the right was 0.8 with  monophasic waveforms 1.06 on the left. However she had a velocity drop off in the superficial femoral artery. She went on to have diagnostic angiogram on 12/18/17 by Dr. Oneida Alar. This showed on the right that the common femoral, profunda femoris and superficial femoral arteries were all patent. There was a focal less than 1 mm length stenosis on the right superficial femoral artery otherwise the artery was widely patent. The right popliteal artery was widely patent. There was 3 vessel runoff via the anterior, tibial and paranoid Milta Deiters arteries to the right foot. She was not felt to have a vascular cause of these wounds. 04/28/18; really no progress at all. Fact the area of the right anterior leg is worse more necrotic tissue. Also more exposed tendon on the right posterior leg and I think the left leg is developing same thing. Her calf looks increasingly pale and almost mottled looking. She has had breakdown on her toes. The patient is seen her rheumatologist that [facility]. From this point of view I would wonder about antiphospholipid syndrome She is also seeing Dr. Shanon Rosser of vascular in [place]. By discussion with her husband I don't think Dr. Doren Custard felt that anything further was to be done even given the breakdown on the left leg. Also if I can review his notes to that effect She is seeing Dr. Lillia Carmel of [facility] dermatology this afternoon I am hopeful that their pathologist will give [place] a diagnosis. I am increasingly worried that  we are on her way to a similar situation in the left sHe's also had 2 separate biopsies. Firstly on 3/25 showed nonspecific findings with calciphylaxis not entirely excluded. No definite calcium deposits were seen within the dermis within the sections reviewed. Vascular features were nonspecific. Furthermore pyogenic granuloma may demonstrate similar findings. It looks as though she had a second biopsy on 12/24/17 this showed stasis changes with  lipoma membranous panniculitis and arterial vascular insufficiency. Calciphylaxis was not favored in this specimen. There was a calcified vessel. I don't see a pathology specimen that specifically says definitive calciphylaxis. The patient had an x-ray of her right tibia while she is in hospital there was no osteomyelitis. She had an MRI of her foot in March that did not show osteomyelitis as well The patient has been using Betadine and wraps to the wounds. This is being done by a home health where they live in [place] 03/17/18; patient admitted to clinic last week. 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 ([id number]) greater than right anterior calf. Considerable amount of necrotic surface material that I removed from the posterior calf last week and again this week. The area anteriorly is far too adherent at this point. She lives in [place]. She has advanced home care home health. Dialyzes in [place] as well. 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 There is very apparent necrotic material in the anterior side. We've been using Santyl anteriorly and silver alginate posteriorly. 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 I've gotten her an appointment at [facility] dermatology next week hopefully to have the original biopsy sites reviewed. The patient is still in a lot of pain. She is not straightening the leg out. She had an angiogram that did not suggest coexistent macrovascular disease 04/07/18; absolutely no improvement in this lady's condition. Extensive wounds on the right anterior covered in necrotic surface and the right posterior calf. There is better looking tissue posteriorly on the right over there is a large exposed tendon. In addition today she seems to develop some wounds on her toes on the left foot which were new this week especially the left third toe most  circumferential loss. This unfortunate woman is in a lot of pain and I once again I've talked to her about an amputation of the right leg which she doesn't want to consider. 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 She has a large necrotic covered wound on the right anterior tibia Large full-thickness wound on the right posterior tibia with healthier looking tissue but a large exposed piece of tendon Necrotic wound on the right lateral heel The new problem this week is wounds on her toes 04/14/18; this patient's overall condition in the right leg is unchanged she has 3 open areas of large necrotic area anteriorly. Better-looking surface but exposed tendon in the large wound posteriorly and an area on her right lateral calcaneus She has new wounds this week on the left first second and third toes Although these all look vascular previous arteriogram on the right did not show an obvious macrovascular etiology. This does not rule out a Small vesselvasculopathy. I have her being followed by Dr. Lillia Carmel at [facility] dermatology to see if they can review the slides already done to see if we can establish a specific etiology i.e. calciphylaxis, antiphospholipid syndrome [patient has SLE]; or some other cause  of this. I have not wanted to attempt to rebiopsy this in our clinic leaving that to the academic dermatologist's. I note she is now on Levaquin I can see in [place] that the dermatologist culture something that was Pseudomonas. I'm not sure what they cultured. Objective Constitutional Sitting or standing Blood Pressure is within target range for patient.. Pulse regular and within target range for patient.Marland Kitchen Respirations regular, non-labored and within target range.. Temperature is normal and within the target range for the patient.Marland Kitchen appears in no distress. Vitals Time Taken: 1:10 PM, Height: 65 in, Weight: 164 lbs, BMI:  27.3, Temperature: 97.7 F, Pulse: 64 bpm, Respiratory Rate: 16 breaths/min, Blood Pressure: 110/36 mmHg. Respiratory Respiratory effort is easy and symmetric bilaterally. Rate is normal at rest and on room air.. Cardiovascular I am able to feel dorsalis pedis pulses bilaterally. Gastrointestinal (GI) Abdomen is soft and non-distended without masses or tenderness. Bowel sounds active in all quadrants.. No liver or spleen TAMEKIA, ROTTER F. ([id number]) enlargement or tenderness.. Lymphatic none palpable in the popliteal or inguinal area. Psychiatric Patient appears depressed today.however she appears to be at baseline. General Notes: wound exam; On the right anterior leg but worsening necrotic surface superiorly and inferiorly. We are making no progress with central On the right posterior leg there is more exposed tendon suggesting deterioration Small wound on the right lateral heel using Santyl to this area. Wounds on the left first second and third toes are painful using silver alginate Integumentary (Hair, Skin) concerned about the skin in the left calf which has a pale and somewhat dusky threatened look. Wound #1 status is Open. Original cause of wound was Gradually Appeared. The wound is located on the Right,Anterior Lower Leg. The wound measures 14.8cm length x 6cm width x 0.3cm depth; 69.743cm^2 area and 20.923cm^3 volume. There is Fat Layer (Subcutaneous Tissue) Exposed exposed. There is no tunneling or undermining noted. There is a medium amount of purulent drainage noted. The wound margin is flat and intact. There is small (1-33%) red, pink granulation within the wound bed. There is a large (67-100%) amount of necrotic tissue within the wound bed including Eschar and Adherent Slough. The periwound skin appearance exhibited: Scarring, Hemosiderin Staining. Periwound temperature was noted as No Abnormality. The periwound has tenderness on palpation. Wound #2 status is Open.  Original cause of wound was Gradually Appeared. The wound is located on the Right,Posterior Lower Leg. The wound measures 12.5cm length x 11.6cm width x 0.6cm depth; 113.883cm^2 area and 68.33cm^3 volume. There is tendon and Fat Layer (Subcutaneous Tissue) Exposed exposed. There is no tunneling or undermining noted. There is a large amount of serosanguineous drainage noted. The wound margin is distinct with the outline attached to the wound base. There is medium (34-66%) red granulation within the wound bed. There is a medium (34-66%) amount of necrotic tissue within the wound bed including Eschar and Adherent Slough. The periwound skin appearance exhibited: Ecchymosis. The periwound skin appearance did not exhibit: Callus, Crepitus, Excoriation, Induration, Rash, Scarring, Dry/Scaly, Maceration, Atrophie Blanche, Cyanosis, Hemosiderin Staining, Mottled, Pallor, Rubor, Erythema. Periwound temperature was noted as No Abnormality. The periwound has tenderness on palpation. Wound #3 status is Open. Original cause of wound was Gradually Appeared. The wound is located on the Right,Lateral Calcaneus. The wound measures 2cm length x 2.1cm width x 0.3cm depth; 3.299cm^2 area and 0.99cm^3 volume. There is no tunneling or undermining noted. There is a none present amount of drainage noted. The wound margin is distinct with the  outline attached to the wound base. There is no granulation within the wound bed. There is a large (67-100%) amount of necrotic tissue within the wound bed including Eschar. The periwound skin appearance exhibited: Ecchymosis. The periwound skin appearance did not exhibit: Callus, Crepitus, Excoriation, Induration, Rash, Scarring, Dry/Scaly, Maceration, Atrophie Blanche, Cyanosis, Hemosiderin Staining, Mottled, Pallor, Rubor, Erythema. Periwound temperature was noted as No Abnormality. The periwound has tenderness on palpation. Wound #4 status is Open. Original cause of wound was  Pressure Injury. The wound is located on the Left,Dorsal Metatarsal head first. The wound measures 0.5cm length x 0.5cm width x 0.1cm depth; 0.196cm^2 area and 0.02cm^3 volume. There is Fat Layer (Subcutaneous Tissue) Exposed exposed. There is no tunneling or undermining noted. There is a none present amount of drainage noted. Foul odor after cleansing was noted. The wound margin is distinct with the outline attached to the wound base. There is no granulation within the wound bed. There is a large (67-100%) amount of necrotic tissue within the wound bed including Eschar. The periwound skin appearance did not exhibit: Callus, Crepitus, Excoriation, Induration, Rash, Scarring, Dry/Scaly, Maceration, Atrophie Blanche, Cyanosis, Ecchymosis, Hemosiderin Staining, Mottled, Pallor, Rubor, Erythema. Periwound temperature was noted as No Abnormality. The periwound has tenderness on palpation. Wound #5 status is Open. Original cause of wound was Pressure Injury. The wound is located on the Left Toe Third. The wound measures 2cm length x 5cm width x 0.1cm depth; 7.854cm^2 area and 0.785cm^3 volume. There is no tunneling or undermining noted. There is a medium amount of purulent drainage noted. Foul odor after cleansing was noted. The wound margin is distinct with the outline attached to the wound base. There is no granulation within the wound bed. There is a large (67-100%) amount of necrotic tissue within the wound bed including Eschar and Adherent Slough. The periwound skin appearance did not exhibit: Callus, Crepitus, Excoriation, Induration, Rash, Scarring, Dry/Scaly, Maceration, Atrophie Blanche, Cyanosis, Ecchymosis, Hemosiderin Staining, Mottled, Pallor, Rubor, Erythema. Periwound temperature was noted [name], [name] F. ([id number]) as No Abnormality. The periwound has tenderness on palpation. Wound #6 status is Open. Original cause of wound was Gradually Appeared. The wound is located on the Left  Toe - Web between 2nd and 3rd. The wound measures 3cm length x 1.5cm width x 0.1cm depth; 3.534cm^2 area and 0.353cm^3 volume. There is no tunneling or undermining noted. There is a large amount of purulent drainage noted. The wound margin is flat and intact. There is no granulation within the wound bed. There is a large (67-100%) amount of necrotic tissue within the wound bed including Adherent Slough. The periwound skin appearance exhibited: Maceration. Periwound temperature was noted as No Abnormality. The periwound has tenderness on palpation. Wound #7 status is Open. Original cause of wound was Gradually Appeared. The wound is located on the Left Toe - Web between 3rd and 4th. The wound measures 2cm length x 1cm width x 0.1cm depth; 1.571cm^2 area and 0.157cm^3 volume. There is no tunneling or undermining noted. There is a large amount of purulent drainage noted. The wound margin is flat and intact. There is no granulation within the wound bed. There is a large (67-100%) amount of necrotic tissue within the wound bed including Adherent Slough. The periwound skin appearance exhibited: Maceration. Periwound temperature was noted as No Abnormality. The periwound has tenderness on palpation. Wound #8 status is Open. Original cause of wound was Gradually Appeared. The wound is located on the Dorsal Toe Great. The wound measures 0.6cm length x 0.9cm  width x 0.1cm depth; 0.424cm^2 area and 0.042cm^3 volume. There is no tunneling or undermining noted. There is a none present amount of drainage noted. The wound margin is distinct with the outline attached to the wound base. There is no granulation within the wound bed. There is a large (67-100%) amount of necrotic tissue within the wound bed including Eschar. The periwound skin appearance did not exhibit: Callus, Crepitus, Excoriation, Induration, Rash, Scarring, Dry/Scaly, Maceration, Atrophie Blanche, Cyanosis, Ecchymosis, Hemosiderin Staining,  Mottled, Pallor, Rubor, Erythema. Periwound temperature was noted as No Abnormality. The periwound has tenderness on palpation. Wound #9 status is Open. Original cause of wound was Gradually Appeared. The wound is located on the Right Toe - Web between 2nd and 3rd. The wound measures 2cm length x 1.5cm width x 0.1cm depth; 2.356cm^2 area and 0.236cm^3 volume. There is no tunneling or undermining noted. There is a none present amount of drainage noted. The wound margin is distinct with the outline attached to the wound base. There is no granulation within the wound bed. There is a large (67-100%) amount of necrotic tissue within the wound bed including Eschar. The periwound skin appearance exhibited: Maceration. Periwound temperature was noted as No Abnormality. The periwound has tenderness on palpation. 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 AVONNE, BERKERY F. ([id number]) Anesthetic (add to Medication List): Wound #1 Right,Anterior Lower Leg: Topical Lidocaine 4% cream applied to wound bed prior to debridement (In Clinic Only). Wound #2 Right,Posterior Lower Leg: Topical Lidocaine 4% cream applied to wound bed prior to debridement (In Clinic Only). Wound #3 Right,Lateral Calcaneus: Topical Lidocaine 4% cream applied to wound bed prior to debridement (In Clinic Only). Wound #4 Left,Dorsal Metatarsal head first: Topical Lidocaine 4% cream applied to wound bed prior to debridement (In Clinic  Only). Wound #5 Left Toe Third: Topical Lidocaine 4% cream applied to wound bed prior to debridement (In Clinic Only). Wound #6 Left Toe - Web between 2nd and 3rd: Topical Lidocaine 4% cream applied to wound bed prior to debridement (In Clinic Only). Wound #7 Left Toe - Web between 3rd and 4th: Topical Lidocaine 4% cream applied to wound bed prior to debridement (In Clinic Only). Wound #8 Dorsal Toe Great: Topical Lidocaine 4% cream applied to wound bed prior to debridement (In Clinic Only). Wound #9 Right Toe - Web between 2nd and 3rd: Topical Lidocaine 4% cream applied to wound bed prior to debridement (In Clinic Only). Primary Wound Dressing: Wound #1 Right,Anterior Lower Leg: Santyl Ointment Wound #2 Right,Posterior Lower Leg: Mepitel One Contact layer - Hydrogel covered by contact layer over tendon to keep moist. Wound #3 Right,Lateral Calcaneus: Silver Alginate Wound #4 Left,Dorsal Metatarsal head first: Silver Alginate Wound #5 Left Toe Third: Silver Alginate Wound #6 Left Toe - Web between 2nd and 3rd: Silver Alginate Wound #7 Left Toe - Web between 3rd and 4th: Silver Alginate Wound #8 Dorsal Toe Great: Silver Alginate Wound #9 Right Toe - Web between 2nd and 3rd: Silver Alginate Secondary Dressing: Wound #1 Right,Anterior Lower Leg: ABD pad Wound #2 Right,Posterior Lower Leg: ABD pad Wound #3  Right,Lateral Calcaneus: ABD pad Wound #4 Left,Dorsal Metatarsal head first: ABD pad Wound #5 Left Toe Third: ABD pad Wound #6 Left Toe - Web between 2nd and 3rd: ABD pad Wound #7 Left Toe - Web between 3rd and 4th: ABD pad Wound #8 Dorsal Toe Great: ABD pad Wound #9 Right Toe - Web between 2nd and 3rd: ABD pad Dressing Change Frequency: Wound #1 Right,Anterior Lower Leg: Change Dressing Monday, Wednesday, Friday - HHRN to change on Friday and Monday LEONOR, DARNELL. ([id number]) Wound #2 Right,Posterior Lower Leg: Change Dressing Monday, Wednesday, Friday -  HHRN to change on Friday and Monday Wound #3 Right,Lateral Calcaneus: Change Dressing Monday, Wednesday, Friday - HHRN to change on Friday and Monday Wound #4 Left,Dorsal Metatarsal head first: Change Dressing Monday, Wednesday, Friday - HHRN to change on Friday and Monday Wound #5 Left Toe Third: Change Dressing Monday, Wednesday, Friday - HHRN to change on Friday and Monday Wound #6 Left Toe - Web between 2nd and 3rd: Change Dressing Monday, Wednesday, Friday - HHRN to change on Friday and Monday Wound #7 Left Toe - Web between 3rd and 4th: Change Dressing Monday, Wednesday, Friday - HHRN to change on Friday and Monday Wound #8 Dorsal Toe Great: Change Dressing Monday, Wednesday, Friday - HHRN to change on Friday and Monday Wound #9 Right Toe - Web between 2nd and 3rd: Change Dressing Monday, Wednesday, Friday - HHRN to change on Friday and Monday Follow-up Appointments: Wound #1 Right,Anterior Lower Leg: Return Appointment in 2 weeks. Wound #2 Right,Posterior Lower Leg: Return Appointment in 2 weeks. Wound #3 Right,Lateral Calcaneus: Return Appointment in 2 weeks. Wound #4 Left,Dorsal Metatarsal head first: Return Appointment in 2 weeks. Wound #5 Left Toe Third: Return Appointment in 2 weeks. Wound #6 Left Toe - Web between 2nd and 3rd: Return Appointment in 2 weeks. Wound #7 Left Toe - Web between 3rd and 4th: Return Appointment in 2 weeks. Wound #8 Dorsal Toe Great: Return Appointment in 2 weeks. Wound #9 Right Toe - Web between 2nd and 3rd: Return Appointment in 2 weeks. Edema Control: Wound #1 Right,Anterior Lower Leg: Kerlix and Coban - Right Lower Extremity Wound #2 Right,Posterior Lower Leg: Kerlix and Coban - Right Lower Extremity Wound #3 Right,Lateral Calcaneus: Kerlix and Coban - Right Lower Extremity Home Health: Wound #1 Right,Anterior Lower Leg: [place] Nurse may visit PRN to address patient s wound care needs. FACE TO FACE  ENCOUNTER: MEDICARE and MEDICAID PATIENTS: I certify that this patient is under my care and that I had a face-to-face encounter that meets the physician face-to-face encounter requirements with this patient on this date. The encounter with the patient was in whole or in part for the following MEDICAL CONDITION: (primary reason for [place]) MEDICAL NECESSITY: I certify, that based on my findings, NURSING services are a medically necessary home health service. HOME BOUND STATUS: I certify that my clinical findings support that this patient is homebound (i.e., Due to illness or injury, pt requires aid of supportive devices such as crutches, cane, wheelchairs, walkers, the use of special transportation or the assistance of another person to leave their place of residence. There is a normal inability to leave the home and doing so requires considerable and taxing effort. Other absences are for medical reasons / religious services and are infrequent or of short duration when for other reasons). If current dressing causes regression in wound condition, may D/C ordered dressing product/s and apply Normal Saline Moist Dressing daily until  next [place] / Other MD appointment. [place] of regression in wound condition at [phone]. Please direct any NON-WOUND related issues/requests for orders to patient's Primary Care Physician Wound #2 Right,Posterior Lower Leg: PRUDIE, GUTHRIDGE ([id number]) [place] Nurse may visit PRN to address patient s wound care needs. FACE TO FACE ENCOUNTER: MEDICARE and MEDICAID PATIENTS: I certify that this patient is under my care and that I had a face-to-face encounter that meets the physician face-to-face encounter requirements with this patient on this date. The encounter with the patient was in whole or in part for the following MEDICAL CONDITION: (primary reason for [place]) MEDICAL  NECESSITY: I certify, that based on my findings, NURSING services are a medically necessary home health service. HOME BOUND STATUS: I certify that my clinical findings support that this patient is homebound (i.e., Due to illness or injury, pt requires aid of supportive devices such as crutches, cane, wheelchairs, walkers, the use of special transportation or the assistance of another person to leave their place of residence. There is a normal inability to leave the home and doing so requires considerable and taxing effort. Other absences are for medical reasons / religious services and are infrequent or of short duration when for other reasons). If current dressing causes regression in wound condition, may D/C ordered dressing product/s and apply Normal Saline Moist Dressing daily until next [place] / Other MD appointment. [place] of regression in wound condition at [phone]. Please direct any NON-WOUND related issues/requests for orders to patient's Primary Care Physician Wound #3 Right,Lateral Calcaneus: [place] Nurse may visit PRN to address patient s wound care needs. FACE TO FACE ENCOUNTER: MEDICARE and MEDICAID PATIENTS: I certify that this patient is under my care and that I had a face-to-face encounter that meets the physician face-to-face encounter requirements with this patient on this date. The encounter with the patient was in whole or in part for the following MEDICAL CONDITION: (primary reason for [place]) MEDICAL NECESSITY: I certify, that based on my findings, NURSING services are a medically necessary home health service. HOME BOUND STATUS: I certify that my clinical findings support that this patient is homebound (i.e., Due to illness or injury, pt requires aid of supportive devices such as crutches, cane, wheelchairs, walkers, the use of special transportation or the assistance of another person to  leave their place of residence. There is a normal inability to leave the home and doing so requires considerable and taxing effort. Other absences are for medical reasons / religious services and are infrequent or of short duration when for other reasons). If current dressing causes regression in wound condition, may D/C ordered dressing product/s and apply Normal Saline Moist Dressing daily until next [place] / Other MD appointment. [place] of regression in wound condition at [phone]. Please direct any NON-WOUND related issues/requests for orders to patient's Primary Care Physician Wound #4 Left,Dorsal Metatarsal head first: [place] Nurse may visit PRN to address patient s wound care needs. FACE TO FACE ENCOUNTER: MEDICARE and MEDICAID PATIENTS: I certify that this patient is under my care and that I had a face-to-face encounter that meets the physician face-to-face encounter requirements with this patient on this date. The encounter with the patient was in whole or in part for the following MEDICAL CONDITION: (primary reason for [place]) MEDICAL NECESSITY: I certify, that based  on my findings, NURSING services are a medically necessary home health service. HOME BOUND STATUS: I certify that my clinical findings support that this patient is homebound (i.e., Due to illness or injury, pt requires aid of supportive devices such as crutches, cane, wheelchairs, walkers, the use of special transportation or the assistance of another person to leave their place of residence. There is a normal inability to leave the home and doing so requires considerable and taxing effort. Other absences are for medical reasons / religious services and are infrequent or of short duration when for other reasons). If current dressing causes regression in wound condition, may D/C ordered dressing product/s and apply Normal Saline Moist Dressing  daily until next [place] / Other MD appointment. [place] of regression in wound condition at [phone]. Please direct any NON-WOUND related issues/requests for orders to patient's Primary Care Physician Wound #5 Left Toe Third: [place] Nurse may visit PRN to address patient s wound care needs. FACE TO FACE ENCOUNTER: MEDICARE and MEDICAID PATIENTS: I certify that this patient is under my care and that I had a face-to-face encounter that meets the physician face-to-face encounter requirements with this patient on this date. The encounter with the patient was in whole or in part for the following MEDICAL CONDITION: (primary reason for [place]) MEDICAL NECESSITY: I certify, that based on my findings, NURSING services are a medically necessary home health service. HOME BOUND STATUS: I certify that my clinical findings support that this patient is homebound (i.e., Due to illness or injury, pt requires aid of supportive devices such as crutches, cane, wheelchairs, walkers, the use of special transportation or the assistance of another person to leave their place of residence. There is a normal inability to leave the home and doing so requires considerable and taxing effort. Other absences are for medical reasons / religious services and are infrequent or of short duration when for other reasons). If current dressing causes regression in wound condition, may D/C ordered dressing product/s and apply Normal Saline [name], [place] [name] F. ([id number]) Moist Dressing daily until next [place] / Other MD appointment. [place] of regression in wound condition at [phone]. Please direct any NON-WOUND related issues/requests for orders to patient's Primary Care Physician Wound #6 Left Toe - Web between 2nd and 3rd: [place] Nurse may visit PRN to address patient s wound  care needs. FACE TO FACE ENCOUNTER: MEDICARE and MEDICAID PATIENTS: I certify that this patient is under my care and that I had a face-to-face encounter that meets the physician face-to-face encounter requirements with this patient on this date. The encounter with the patient was in whole or in part for the following MEDICAL CONDITION: (primary reason for [place]) MEDICAL NECESSITY: I certify, that based on my findings, NURSING services are a medically necessary home health service. HOME BOUND STATUS: I certify that my clinical findings support that this patient is homebound (i.e., Due to illness or injury, pt requires aid of supportive devices such as crutches, cane, wheelchairs, walkers, the use of special transportation or the assistance of another person to leave their place of residence. There is a normal inability to leave the home and doing so requires considerable and taxing effort. Other absences are for medical reasons / religious services and are infrequent or of short duration when for other reasons). If current dressing causes regression in wound condition, may D/C ordered dressing product/s and  apply Normal Saline Moist Dressing daily until next [place] / Other MD appointment. [place] of regression in wound condition at [phone]. Please direct any NON-WOUND related issues/requests for orders to patient's Primary Care Physician Wound #7 Left Toe - Web between 3rd and 4th: [place] Nurse may visit PRN to address patient s wound care needs. FACE TO FACE ENCOUNTER: MEDICARE and MEDICAID PATIENTS: I certify that this patient is under my care and that I had a face-to-face encounter that meets the physician face-to-face encounter requirements with this patient on this date. The encounter with the patient was in whole or in part for the following MEDICAL CONDITION: (primary reason for [place]) MEDICAL  NECESSITY: I certify, that based on my findings, NURSING services are a medically necessary home health service. HOME BOUND STATUS: I certify that my clinical findings support that this patient is homebound (i.e., Due to illness or injury, pt requires aid of supportive devices such as crutches, cane, wheelchairs, walkers, the use of special transportation or the assistance of another person to leave their place of residence. There is a normal inability to leave the home and doing so requires considerable and taxing effort. Other absences are for medical reasons / religious services and are infrequent or of short duration when for other reasons). If current dressing causes regression in wound condition, may D/C ordered dressing product/s and apply Normal Saline Moist Dressing daily until next [place] / Other MD appointment. [place] of regression in wound condition at [phone]. Please direct any NON-WOUND related issues/requests for orders to patient's Primary Care Physician Wound #8 Dorsal Toe Great: [place] Nurse may visit PRN to address patient s wound care needs. FACE TO FACE ENCOUNTER: MEDICARE and MEDICAID PATIENTS: I certify that this patient is under my care and that I had a face-to-face encounter that meets the physician face-to-face encounter requirements with this patient on this date. The encounter with the patient was in whole or in part for the following MEDICAL CONDITION: (primary reason for [place]) MEDICAL NECESSITY: I certify, that based on my findings, NURSING services are a medically necessary home health service. HOME BOUND STATUS: I certify that my clinical findings support that this patient is homebound (i.e., Due to illness or injury, pt requires aid of supportive devices such as crutches, cane, wheelchairs, walkers, the use of special transportation or the assistance of another person to leave their  place of residence. There is a normal inability to leave the home and doing so requires considerable and taxing effort. Other absences are for medical reasons / religious services and are infrequent or of short duration when for other reasons). If current dressing causes regression in wound condition, may D/C ordered dressing product/s and apply Normal Saline Moist Dressing daily until next [place] / Other MD appointment. [place] of regression in wound condition at [phone]. Please direct any NON-WOUND related issues/requests for orders to patient's Primary Care Physician Wound #9 Right Toe - Web between 2nd and 3rd: [place] Nurse may visit PRN to address patient s wound care needs. FACE TO FACE ENCOUNTER: MEDICARE and MEDICAID PATIENTS: I certify that this patient is under my care and that I had a face-to-face encounter that meets the physician face-to-face encounter requirements with this patient on this date. The encounter with the patient was in whole or in part for the following  MEDICAL CONDITION: (primary reason for [facility]) MEDICAL NECESSITY: I certify, that based on my findings, NURSING services are a medically necessary home health service. HOME BOUND STATUS: I certify that my clinical findings support that this patient is homebound (i.e., Due to illness or injury, pt requires aid of supportive devices such as crutches, cane, wheelchairs, walkers, the use of special [name], [name]. ([id number]) transportation or the assistance of another person to leave their place of residence. There is a normal inability to leave the home and doing so requires considerable and taxing effort. Other absences are for medical reasons / religious services and are infrequent or of short duration when for other reasons). If current dressing causes regression in wound condition, may D/C ordered dressing product/s and apply Normal  Saline Moist Dressing daily until next [place] / Other MD appointment. [place] of regression in wound condition at [phone]. Please direct any NON-WOUND related issues/requests for orders to patient's Primary Care Physician Medical Decision Making 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scle 03/10/2018 Status: Worsening Complications: the right anterior lower extremity wounds are deteriorating. More necrotic tissue. The area on the back with exposed tendon is about the same. 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 Hopefully they have her slides from late March 2 biopsy sites. 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 Ulcer left first second and third toes very painful small ulcers that look vascular Interventions: using silver alginate #1 I have moved to silver alginate all areas. I don't think were making any progress. #2 apparently not felt to be macrovascular disease by Dr. Doren Custard although I'll try to check his note. I previously communicated with him by secured text message #3 she is seen her rheumatologist/SLE Dr. and she is also being reviewed this afternoon by [facility] dermatology. 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 Would wonder about antiphospholipid syndrome associated with her lupus or possibly a neutrophilic dermatitis/ulcer Electronic Signature(s) Signed: 04/28/2018 6:17:43 PM By:  Linton Ham MD Entered By: Linton Ham on 04/28/2018 15:06:55 [name] ([id number]) -------------------------------------------------------------------------------- [place] Details Patient Name: [name]. Date of Service: 04/28/2018 Medical Record Number: [id number] Patient Account Number: [id number] Date of Birth/Sex: [birth year] (72 y.o. 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 I13.2 disease, or end stage renal disease Facility Procedures CPT4 Code: [id number] Description: 99214 - WOUND CARE VISIT-LEV 4 EST PT Modifier: Quantity: 1 Physician Procedures CPT4 Code Description: [id number] 99213 - WC PHYS LEVEL 3 - EST PT ICD-10 Diagnosis Description L97.213 Non-pressure chronic ulcer of right calf with necrosis  of muscle L97.529 Non-pressure chronic ulcer of other part of left foot with unspec Modifier: ified severity Quantity: 1 Electronic Signature(s) Signed: 04/30/2018 8:08:59 AM By: Gretta Cool, BSN, RN, CWS, Kim RN, BSN Previous Signature: 04/28/2018 6:17:43 PM Version By: Linton Ham MD Entered By: Gretta Cool, BSN, RN, CWS, Kim on 04/30/2018 08:08:59

## 2018-05-09 NOTE — Progress Notes (Signed)
TAYSHA, MAJEWSKI ([id number]) Visit Report for 04/28/2018 Arrival Information Details Patient Name: [name], [name]. Date of Service: 04/28/2018 1:00 PM Medical Record Number: [id number] Patient Account Number: [id number] Date of Birth/Sex: [birth year] (72 y.o. F) Treating RN: Montey Hora Primary Care Bathsheba Durrett: Sharilyn Sites Other Clinician: Referring Azaliah Carrero: Sharilyn Sites Treating Divante Kotch/Extender: Tito Dine in Treatment: 7 Visit Information History Since Last Visit Added or deleted any medications: No Patient Arrived: Wheel Chair Any new allergies or adverse reactions: No Arrival Time: 13:06 Had a fall or experienced change in No Accompanied By: spouse activities of daily living that may affect Transfer Assistance: Manual risk of falls: Patient Identification Verified: Yes Signs or symptoms of abuse/neglect since last visito No Secondary Verification Process Yes Hospitalized since last visit: No Completed: Implantable device outside of the clinic excluding No Patient Requires Transmission-Based No cellular tissue based products placed in the center Precautions: since last visit: Patient Has Alerts: Yes Has Dressing in Place as Prescribed: Yes Patient Alerts: Patient on Blood Has Compression in Place as Prescribed: Yes Thinner Pain Present Now: Yes Eliquis DM II Electronic Signature(s) Signed: 04/28/2018 5:29:24 PM By: Montey Hora Entered By: Montey Hora on 04/28/2018 13:06:21 [name] ([id number]) -------------------------------------------------------------------------------- Clinic Level of Care Assessment Details Patient Name: [name]. Date of Service: 04/28/2018 1:00 PM Medical Record Number: [id number] Patient Account Number: [id number] Date of Birth/Sex: [birth year] (72 y.o. F) Treating RN: Cornell Barman Primary Care Kamela Blansett: Sharilyn Sites Other Clinician: Referring Elsy Chiang: Sharilyn Sites Treating  Amesha Bailey/Extender: Tito Dine in Treatment: 7 Clinic Level of Care Assessment Items TOOL 4 Quantity Score []  - Use when only an EandM is performed on FOLLOW-UP visit 0 ASSESSMENTS - Nursing Assessment / Reassessment X - Reassessment of Co-morbidities (includes updates in patient status) 1 10 X- 1 5 Reassessment of Adherence to Treatment Plan ASSESSMENTS - Wound and Skin Assessment / Reassessment []  - Simple Wound Assessment / Reassessment - one wound 0 X- 4 5 Complex Wound Assessment / Reassessment - multiple wounds []  - 0 Dermatologic / Skin Assessment (not related to wound area) ASSESSMENTS - Focused Assessment []  - Circumferential Edema Measurements - multi extremities 0 []  - 0 Nutritional Assessment / Counseling / Intervention []  - 0 Lower Extremity Assessment (monofilament, tuning fork, pulses) []  - 0 Peripheral Arterial Disease Assessment (using hand held doppler) ASSESSMENTS - Ostomy and/or Continence Assessment and Care []  - Incontinence Assessment and Management 0 []  - 0 Ostomy Care Assessment and Management (repouching, etc.) PROCESS - Coordination of Care X - Simple Patient / Family Education for ongoing care 1 15 []  - 0 Complex (extensive) Patient / Family Education for ongoing care X- 1 10 Staff obtains Programmer, systems, Records, Test Results / Process Orders []  - 0 Staff telephones HHA, Nursing Homes / Clarify orders / etc []  - 0 Routine Transfer to another Facility (non-emergent condition) []  - 0 Routine Hospital Admission (non-emergent condition) []  - 0 New Admissions / Biomedical engineer / Ordering NPWT, Apligraf, etc. []  - 0 Emergency Hospital Admission (emergent condition) X- 1 10 Simple Discharge Coordination SUNDEE, GARLAND. ([id number]) []  - 0 Complex (extensive) Discharge Coordination PROCESS - Special Needs []  - Pediatric / Minor Patient Management 0 []  - 0 Isolation Patient Management []  - 0 Hearing / Language / Visual  special needs []  - 0 Assessment of Community assistance (transportation, D/C planning, etc.) []  - 0 Additional assistance / Altered mentation []  - 0 Support Surface(s) Assessment (bed, cushion, seat, etc.) INTERVENTIONS - Wound Cleansing /  Measurement []  - Simple Wound Cleansing - one wound 0 X- 4 5 Complex Wound Cleansing - multiple wounds X- 1 5 Wound Imaging (photographs - any number of wounds) []  - 0 Wound Tracing (instead of photographs) []  - 0 Simple Wound Measurement - one wound X- 4 5 Complex Wound Measurement - multiple wounds INTERVENTIONS - Wound Dressings []  - Small Wound Dressing one or multiple wounds 0 []  - 0 Medium Wound Dressing one or multiple wounds X- 1 20 Large Wound Dressing one or multiple wounds []  - 0 Application of Medications - topical []  - 0 Application of Medications - injection INTERVENTIONS - Miscellaneous []  - External ear exam 0 []  - 0 Specimen Collection (cultures, biopsies, blood, body fluids, etc.) []  - 0 Specimen(s) / Culture(s) sent or taken to Lab for analysis []  - 0 Patient Transfer (multiple staff / Civil Service fast streamer / Similar devices) []  - 0 Simple Staple / Suture removal (25 or less) []  - 0 Complex Staple / Suture removal (26 or more) []  - 0 Hypo / Hyperglycemic Management (close monitor of Blood Glucose) []  - 0 Ankle / Brachial Index (ABI) - do not check if billed separately X- 1 5 Vital Signs [name], Dana F. ([id number]) Has the patient been seen at the hospital within the last three years: Yes Total Score: 140 Level Of Care: New/Established - Level 4 Electronic Signature(s) Signed: 04/30/2018 6:17:48 PM By: Gretta Cool, BSN, RN, CWS, Kim RN, BSN Entered By: Gretta Cool, BSN, RN, CWS, Kim on 04/30/2018 08:08:12 [name] ([id number]) -------------------------------------------------------------------------------- Encounter Discharge Information Details Patient Name: [name]. Date of Service: 04/28/2018 1:00  PM Medical Record Number: [id number] Patient Account Number: [id number] Date of Birth/Sex: 03/08/1946 (72 y.o. F) Treating RN: Montey Hora Primary Care Frieda Arnall: Sharilyn Sites Other Clinician: Referring Vika Buske: Sharilyn Sites Treating Fisher Hargadon/Extender: Tito Dine in Treatment: 7 Encounter Discharge Information Items Discharge Condition: Stable Ambulatory Status: Wheelchair Discharge Destination: Home Transportation: Private Auto Accompanied By: spouse Schedule Follow-up Appointment: Yes Clinical Summary of Care: Electronic Signature(s) Signed: 04/28/2018 3:09:47 PM By: Montey Hora Entered By: Montey Hora on 04/28/2018 15:09:47 [name] ([id number]) -------------------------------------------------------------------------------- Lower Extremity Assessment Details Patient Name: [name]. Date of Service: 04/28/2018 1:00 PM Medical Record Number: [id number] Patient Account Number: [id number] Date of Birth/Sex: Dec 08, 1945 (72 y.o. F) Treating RN: Montey Hora Primary Care Alahni Varone: Sharilyn Sites Other Clinician: Referring Senovia Gauer: Sharilyn Sites Treating Eulises Kijowski/Extender: Tito Dine in Treatment: 7 Vascular Assessment Pulses: Dorsalis Pedis Palpable: [Left:No] [Right:No] Doppler Audible: [Left:Yes] [Right:Yes] Posterior Tibial Extremity colors, hair growth, and conditions: Extremity Color: [Left:Hyperpigmented] [Right:Hyperpigmented] Hair Growth on Extremity: [Left:No] [Right:No] Temperature of Extremity: [Left:Warm] [Right:Warm] Capillary Refill: [Left:< 3 seconds] [Right:< 3 seconds] Toe Nail Assessment Left: Right: Thick: Yes Yes Discolored: Yes Yes Deformed: Yes Yes Improper Length and Hygiene: No No Electronic Signature(s) Signed: 04/28/2018 5:29:24 PM By: Montey Hora Entered By: Montey Hora on 04/28/2018 13:31:55 [name]  ([id number]) -------------------------------------------------------------------------------- Multi Wound Chart Details Patient Name: [name]. Date of Service: 04/28/2018 1:00 PM Medical Record Number: [id number] Patient Account Number: [id number] Date of Birth/Sex: Jun 16, 1946 (72 y.o. F) Treating RN: Cornell Barman Primary Care Javayah Magaw: Sharilyn Sites Other Clinician: Referring Rafan Sanders: Sharilyn Sites Treating Julius Boniface/Extender: Tito Dine in Treatment: 7 Vital Signs Height(in): 65 Pulse(bpm): 80 Weight(lbs): 164 Blood Pressure(mmHg): 110/36 Body Mass Index(BMI): 27 Temperature(F): 97.7 Respiratory Rate 16 (breaths/min): Photos: [1:No Photos] [2:No Photos] [3:No Photos] Wound Location: [1:Right Lower Leg - Anterior] [2:Right Lower Leg - Posterior] [3:Right  Calcaneus - Lateral] Wounding Event: [1:Gradually Appeared] [2:Gradually Appeared] [3:Gradually Appeared] Primary Etiology: [1:Calciphylaxis] [2:Calciphylaxis] [3:Calciphylaxis] Secondary Etiology: [1:Diabetic Wound/Ulcer of the Lower Extremity] [2:Diabetic Wound/Ulcer of the Lower Extremity] [3:Diabetic Wound/Ulcer of the Lower Extremity] Comorbid History: [1:Cataracts, Anemia, Chronic Obstructive Pulmonary Disease (COPD), Arrhythmia, Hypertension, Type II Diabetes, End Stage Renal Disease, Gout, Rheumatoid Arthritis] [2:Cataracts, Anemia, Chronic Obstructive Pulmonary Disease (COPD),  Arrhythmia, Hypertension, Type II Diabetes, End Stage Renal Disease, Gout, Rheumatoid Arthritis] [3:Cataracts, Anemia, Chronic Obstructive Pulmonary Disease (COPD), Arrhythmia, Hypertension, Type II Diabetes, End Stage Renal Disease, Gout, Rheumatoid  Arthritis] Date Acquired: [1:06/29/2017] [2:06/29/2017] [3:06/29/2017] Weeks of Treatment: [1:7] [2:7] [3:7] Wound Status: [1:Open] [2:Open] [3:Open] Measurements L x W x D [1:14.8x6x0.3] [2:12.5x11.6x0.6] [3:2x2.1x0.3] (cm) Area (cm) : [1:69.743] [2:113.883] [3:3.299] Volume  (cm) : [1:20.923] [2:68.33] [3:0.99] % Reduction in Area: [1:-101.10%] [2:0.70%] [3:-281.80%] % Reduction in Volume: [1:-201.60%] [2:-98.60%] [3:-1051.20%] Classification: [1:Full Thickness Without Exposed Support Structures] [2:Full Thickness With Exposed Support Structures] [3:Full Thickness Without Exposed Support Structures] Exudate Amount: [1:Medium] [2:Large] [3:None Present] Exudate Type: [1:Purulent] [2:Serosanguineous] [3:N/A] Exudate Color: [1:yellow, brown, green] [2:red, brown] [3:N/A] Foul Odor After Cleansing: [1:No] [2:No] [3:No] Odor Anticipated Due to [1:N/A] [2:N/A] [3:N/A] Product Use: Wound Margin: [1:Flat and Intact] [2:Distinct, outline attached] [3:Distinct, outline attached] Granulation Amount: [1:Small (1-33%)] [2:Medium (34-66%)] [3:None Present (0%)] Granulation Quality: [1:Red, Pink] [2:Red] [3:N/A] Necrotic Amount: [1:Large (67-100%)] [2:Medium (34-66%)] [3:Large (67-100%)] Necrotic Tissue: [1:Eschar, Adherent Slough] [2:Eschar, Adherent Slough] [3:Eschar] Exposed Structures: [1:Fat Layer (Subcutaneous Tissue) Exposed: Yes] [2:Fat Layer (Subcutaneous Tissue) Exposed: Yes] [3:Fascia: No Fat Layer (Subcutaneous] Fascia: No Tendon: Yes Tissue) Exposed: No Tendon: No Fascia: No Tendon: No Muscle: No Muscle: No Muscle: No Joint: No Joint: No Joint: No Bone: No Bone: No Bone: No Epithelialization: None None None Periwound Skin Texture: Scarring: Yes Excoriation: No Excoriation: No Induration: No Induration: No Callus: No Callus: No Crepitus: No Crepitus: No Rash: No Rash: No Scarring: No Scarring: No Periwound Skin Moisture: No Abnormalities Noted Maceration: No Maceration: No Dry/Scaly: No Dry/Scaly: No Periwound Skin Color: Hemosiderin Staining: Yes Ecchymosis: Yes Ecchymosis: Yes Atrophie Blanche: No Atrophie Blanche: No Cyanosis: No Cyanosis: No Erythema: No Erythema: No Hemosiderin Staining: No Hemosiderin Staining:  No Mottled: No Mottled: No Pallor: No Pallor: No Rubor: No Rubor: No Temperature: No Abnormality No Abnormality No Abnormality Tenderness on Palpation: Yes Yes Yes Wound Preparation: Ulcer Cleansing: Ulcer Cleansing: Ulcer Cleansing: Rinsed/Irrigated with Saline, Rinsed/Irrigated with Saline, Rinsed/Irrigated with Saline, Other: soap and water Other: soap and water Other: soap and water Topical Anesthetic Applied: Topical Anesthetic Applied: Topical Anesthetic Applied: Other: lidocaine 4% Other: lidocaine 4% Other: lidocaine 4% Wound Number: [id number]  Photos: No Photos No Photos No Photos Wound Location: Left Metatarsal head first - Left Toe Third Left Toe - Web between 2nd Dorsal and 3rd Wounding Event: Pressure Injury Pressure Injury Gradually Appeared Primary Etiology: Diabetic Wound/Ulcer of the Diabetic Wound/Ulcer of the Diabetic Wound/Ulcer of the Lower Extremity Lower Extremity Lower Extremity Secondary Etiology: N/A N/A N/A Comorbid History: Cataracts, Anemia, Chronic Cataracts, Anemia, Chronic Cataracts, Anemia, Chronic Obstructive Pulmonary Obstructive Pulmonary Obstructive Pulmonary Disease (COPD), Arrhythmia, Disease (COPD), Arrhythmia, Disease (COPD), Arrhythmia, Hypertension, Type II Hypertension, Type II Hypertension, Type II Diabetes, End Stage Renal Diabetes, End Stage Renal Diabetes, End Stage Renal Disease, Gout, Rheumatoid Disease, Gout, Rheumatoid Disease, Gout, Rheumatoid Arthritis Arthritis Arthritis Date Acquired: 04/05/2018 04/05/2018 04/14/2018 Weeks of Treatment: 3 3 2  Wound Status: Open Open Open Measurements L x W x D 0.5x0.5x0.1 2x5x0.1 3x1.5x0.1 (cm) Area (cm) : 0.196  7.854 3.534 Volume (cm) : 0.02 0.785 0.353 % Reduction in Area: 87.50% -108.30% 66.70% % Reduction in Volume: 87.30% -108.20% 66.70% Classification: Grade 1 Grade 1 Grade 1 Exudate Amount: None Present Medium Large Exudate Type: N/A Purulent Purulent ZOI, DEVINE.  ([id number]) Exudate Color: N/A yellow, brown, green yellow, brown, green Foul Odor After Cleansing: Yes Yes No Odor Anticipated Due to No No N/A Product Use: Wound Margin: Distinct, outline attached Distinct, outline attached Flat and Intact Granulation Amount: None Present (0%) None Present (0%) None Present (0%) Granulation Quality: N/A N/A N/A Necrotic Amount: Large (67-100%) Large (67-100%) Large (67-100%) Necrotic Tissue: Eschar Eschar, Adherent Slough Adherent Slough Exposed Structures: Fat Layer (Subcutaneous Fascia: No Fascia: No Tissue) Exposed: Yes Fat Layer (Subcutaneous Fat Layer (Subcutaneous Fascia: No Tissue) Exposed: No Tissue) Exposed: No Tendon: No Tendon: No Tendon: No Muscle: No Muscle: No Muscle: No Joint: No Joint: No Joint: No Bone: No Bone: No Bone: No Epithelialization: None None None Periwound Skin Texture: Excoriation: No Excoriation: No No Abnormalities Noted Induration: No Induration: No Callus: No Callus: No Crepitus: No Crepitus: No Rash: No Rash: No Scarring: No Scarring: No Periwound Skin Moisture: Maceration: No Maceration: No Maceration: Yes Dry/Scaly: No Dry/Scaly: No Periwound Skin Color: Atrophie Blanche: No Atrophie Blanche: No No Abnormalities Noted Cyanosis: No Cyanosis: No Ecchymosis: No Ecchymosis: No Erythema: No Erythema: No Hemosiderin Staining: No Hemosiderin Staining: No Mottled: No Mottled: No Pallor: No Pallor: No Rubor: No Rubor: No Temperature: No Abnormality No Abnormality No Abnormality Tenderness on Palpation: Yes Yes Yes Wound Preparation: Ulcer Cleansing: Ulcer Cleansing: Ulcer Cleansing: Rinsed/Irrigated with Saline, Rinsed/Irrigated with Saline, Rinsed/Irrigated with Saline, Other: soap and water Other: soap and water Other: soap and water Topical Anesthetic Applied: Topical Anesthetic Applied: Topical Anesthetic Applied: Other: lidocaine 4% Other: lidocaine 4% Other: lidocaine  4% Wound Number: [id number]  Photos: No Photos No Photos No Photos Wound Location: Left Toe - Web between 3rd Toe Great - Dorsal Right Toe - Web between 2nd and 4th and 3rd Wounding Event: Gradually Appeared Gradually Appeared Gradually Appeared Primary Etiology: Diabetic Wound/Ulcer of the Diabetic Wound/Ulcer of the Diabetic Wound/Ulcer of the Lower Extremity Lower Extremity Lower Extremity Secondary Etiology: N/A N/A N/A Comorbid History: Cataracts, Anemia, Chronic Cataracts, Anemia, Chronic Cataracts, Anemia, Chronic Obstructive Pulmonary Obstructive Pulmonary Obstructive Pulmonary Disease (COPD), Arrhythmia, Disease (COPD), Arrhythmia, Disease (COPD), Arrhythmia, Hypertension, Type II Hypertension, Type II Hypertension, Type II Diabetes, End Stage Renal Diabetes, End Stage Renal Diabetes, End Stage Renal Disease, Gout, Rheumatoid Disease, Gout, Rheumatoid Disease, Gout, Rheumatoid Arthritis Arthritis Arthritis Date Acquired: 04/14/2018 04/14/2018 04/14/2018 [name] ([id number]) Weeks of Treatment: 2 2 2  Wound Status: Open Open Open Measurements L x W x D 2x1x0.1 0.6x0.9x0.1 2x1.5x0.1 (cm) Area (cm) : 1.571 0.424 2.356 Volume (cm) : 0.157 0.042 0.236 % Reduction in Area: 61.90% 43.80% 42.90% % Reduction in Volume: 61.90% 44.00% 42.70% Classification: Grade 1 Grade 1 Grade 1 Exudate Amount: Large None Present None Present Exudate Type: Purulent N/A N/A Exudate Color: yellow, brown, green N/A N/A Foul Odor After Cleansing: No No No Odor Anticipated Due to N/A N/A N/A Product Use: Wound Margin: Flat and Intact Distinct, outline attached Distinct, outline attached Granulation Amount: None Present (0%) None Present (0%) None Present (0%) Granulation Quality: N/A N/A N/A Necrotic Amount: Large (67-100%) Large (67-100%) Large (67-100%) Necrotic Tissue: Adherent Slough Eschar Eschar Exposed Structures: Fascia: No Fascia: No Fascia: No Fat Layer (Subcutaneous Fat Layer  (Subcutaneous Fat Layer (Subcutaneous Tissue) Exposed: No Tissue)  Exposed: No Tissue) Exposed: No Tendon: No Tendon: No Tendon: No Muscle: No Muscle: No Muscle: No Joint: No Joint: No Joint: No Bone: No Bone: No Bone: No Epithelialization: None None None Periwound Skin Texture: No Abnormalities Noted Excoriation: No No Abnormalities Noted Induration: No Callus: No Crepitus: No Rash: No Scarring: No Periwound Skin Moisture: Maceration: Yes Maceration: No Maceration: Yes Dry/Scaly: No Periwound Skin Color: No Abnormalities Noted Atrophie Blanche: No No Abnormalities Noted Cyanosis: No Ecchymosis: No Erythema: No Hemosiderin Staining: No Mottled: No Pallor: No Rubor: No Temperature: No Abnormality No Abnormality No Abnormality Tenderness on Palpation: Yes Yes Yes Wound Preparation: Ulcer Cleansing: Ulcer Cleansing: Ulcer Cleansing: Rinsed/Irrigated with Saline Rinsed/Irrigated with Saline, Rinsed/Irrigated with Saline, Other: soap and water Other: soap and water Topical Anesthetic Applied: Other: lidocaine 4% Topical Anesthetic Applied: Topical Anesthetic Applied: Other: lidocaine 4% Other: lidocaine 4% Treatment Notes Electronic Signature(s) Signed: 04/30/2018 6:17:48 PM By: Gretta Cool, BSN, RN, CWS, Kim RN, BSN [address] [employer] ([id number]) Entered By: Gretta Cool, BSN, RN, CWS, Kim on 04/28/2018 14:03:14 [name] ([id number]) -------------------------------------------------------------------------------- [place] Details Patient Name: ANNALI, LYBRAND. Date of Service: 04/28/2018 1:00 PM Medical Record Number: [id number] Patient Account Number: [id number] Date of Birth/Sex: 1946/07/13 (72 y.o. F) Treating RN: Cornell Barman Primary Care Ines Warf: Sharilyn Sites Other Clinician: Referring Sonoma Firkus: Sharilyn Sites Treating Kariel Skillman/Extender: Tito Dine in Treatment: 7 Active Inactive ` Necrotic Tissue Nursing  Diagnoses: Impaired tissue integrity related to necrotic/devitalized tissue Goals: Necrotic/devitalized tissue will be minimized in the wound bed Date Initiated: 03/10/2018 Target Resolution Date: 05/10/2018 Goal Status: Active Patient/caregiver will verbalize understanding of reason and process for debridement of necrotic tissue Date Initiated: 03/10/2018 Target Resolution Date: 03/25/2018 Goal Status: Active Interventions: Assess patient pain level pre-, during and post procedure and prior to discharge Treatment Activities: Apply topical anesthetic as ordered : 03/10/2018 Notes: ` Orientation to the Wound Care Program Nursing Diagnoses: Knowledge deficit related to the wound healing center program Goals: Patient/caregiver will verbalize understanding of the [place] Date Initiated: 03/10/2018 Target Resolution Date: 04/02/2018 Goal Status: Active Interventions: Provide education on orientation to the wound center Notes: ` Soft Tissue Infection Nursing Diagnoses: Impaired tissue integrity Potential for infection: soft tissue TERESSA, MCGLOCKLIN ([id number]) Goals: Patient will remain free of wound infection Date Initiated: 03/10/2018 Target Resolution Date: 04/02/2018 Goal Status: Active Interventions: Assess signs and symptoms of infection every visit Notes: ` Wound/Skin Impairment Nursing Diagnoses: Impaired tissue integrity Goals: Ulcer/skin breakdown will have a volume reduction of 80% by week 12 Date Initiated: 03/10/2018 Target Resolution Date: 06/10/2018 Goal Status: Active Interventions: Assess ulceration(s) every visit Treatment Activities: Skin care regimen initiated : 03/10/2018 Topical wound management initiated : 03/10/2018 Notes: Electronic Signature(s) Signed: 04/30/2018 6:17:48 PM By: Gretta Cool, BSN, RN, CWS, Kim RN, BSN Entered By: Gretta Cool, BSN, RN, CWS, Kim on 04/28/2018 13:58:46 [name]  ([id number]) -------------------------------------------------------------------------------- Pain Assessment Details Patient Name: [name]. Date of Service: 04/28/2018 1:00 PM Medical Record Number: [id number] Patient Account Number: [id number] Date of Birth/Sex: 11-17-1945 (72 y.o. F) Treating RN: Montey Hora Primary Care Shallyn Constancio: Sharilyn Sites Other Clinician: Referring Corrinna Karapetyan: Sharilyn Sites Treating Zian Delair/Extender: Tito Dine in Treatment: 7 Active Problems Location of Pain Severity and Description of Pain Patient Has Paino Yes Site Locations Pain Location: Pain in Ulcers With Dressing Change: Yes Duration of the Pain. Constant / Intermittento Constant Pain Management and Medication Current Pain Management: Electronic Signature(s) Signed: 04/28/2018 5:29:24 PM By: Montey Hora Entered By: Marjory Lies,  Joanna on 04/28/2018 13:06:32 EMMORY, SOLIVAN ([id number]) -------------------------------------------------------------------------------- Patient/Caregiver Education Details Patient Name: ORLENE, SALMONS. Date of Service: 04/28/2018 1:00 PM Medical Record Number: [id number] Patient Account Number: [id number] Date of Birth/Gender: 1946/06/22 (72 y.o. F) Treating RN: Montey Hora Primary Care Physician: Sharilyn Sites Other Clinician: Referring Physician: Sharilyn Sites Treating Physician/Extender: Tito Dine in Treatment: 7 Education Assessment Education Provided To: Patient and Caregiver Education Topics Provided Wound/Skin Impairment: Handouts: Other: wound care as ordered Methods: Demonstration, Explain/Verbal Responses: State content correctly Electronic Signature(s) Signed: 04/28/2018 5:29:24 PM By: Montey Hora Entered By: Montey Hora on 04/28/2018 15:10:10 [name] ([id number]) -------------------------------------------------------------------------------- Wound Assessment Details Patient  Name: [name]. Date of Service: 04/28/2018 1:00 PM Medical Record Number: [id number] Patient Account Number: [id number] Date of Birth/Sex: 10-16-1945 (72 y.o. F) Treating RN: Montey Hora Primary Care Caria Transue: Sharilyn Sites Other Clinician: Referring Jamaul Heist: Sharilyn Sites Treating Nazar Kuan/Extender: Tito Dine in Treatment: 7 Wound Status Wound Number: 1 Primary Calciphylaxis Etiology: Wound Location: Right Lower Leg - Anterior Secondary Diabetic Wound/Ulcer of the Lower Extremity Wounding Event: Gradually Appeared Etiology: Date Acquired: 06/29/2017 Wound Open Weeks Of Treatment: 7 Status: Clustered Wound: No Comorbid Cataracts, Anemia, Chronic Obstructive History: Pulmonary Disease (COPD), Arrhythmia, Hypertension, Type II Diabetes, End Stage Renal Disease, Gout, Rheumatoid Arthritis Wound Measurements Length: (cm) 14.8 Width: (cm) 6 Depth: (cm) 0.3 Area: (cm) 69.743 Volume: (cm) 20.923 % Reduction in Area: -101.1% % Reduction in Volume: -201.6% Epithelialization: None Tunneling: No Undermining: No Wound Description Full Thickness Without Exposed Support Classification: Structures Wound Margin: Flat and Intact Exudate Medium Amount: Exudate Type: Purulent Exudate Color: yellow, brown, green Foul Odor After Cleansing: No Slough/Fibrino Yes Wound Bed Granulation Amount: Small (1-33%) Exposed Structure Granulation Quality: Red, Pink Fascia Exposed: No Necrotic Amount: Large (67-100%) Fat Layer (Subcutaneous Tissue) Exposed: Yes Necrotic Quality: Eschar, Adherent Slough Tendon Exposed: No Muscle Exposed: No Joint Exposed: No Bone Exposed: No Periwound Skin Texture Texture Color No Abnormalities Noted: No No Abnormalities Noted: No Scarring: Yes Hemosiderin Staining: Yes Moisture Temperature / Pain No Abnormalities Noted: No Temperature: No Abnormality Tenderness on Palpation: Yes Wound Preparation JARIKA, ROBBEN F.  ([id number]) Ulcer Cleansing: Rinsed/Irrigated with Saline, Other: soap and water, Topical Anesthetic Applied: Other: lidocaine 4%, Treatment Notes Wound #1 (Right, Anterior Lower Leg) 1. Cleansed with: Cleanse wound with antibacterial soap and water 2. Anesthetic Topical Lidocaine 4% cream to wound bed prior to debridement 4. Dressing Applied: Calcium Alginate with Silver Santyl Ointment 5. Secondary Dressing Applied ABD Pad Notes santyl to black areas and silvercel to red areas, kerlix and coban wrap, mepitel and hydrogel to tendon Electronic Signature(s) Signed: 04/28/2018 5:29:24 PM By: Montey Hora Entered By: Montey Hora on 04/28/2018 13:21:29 [name] ([id number]) -------------------------------------------------------------------------------- Wound Assessment Details Patient Name: [name]. Date of Service: 04/28/2018 1:00 PM Medical Record Number: [id number] Patient Account Number: [id number] Date of Birth/Sex: Apr 07, 1946 (72 y.o. 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 Wounding Event: Gradually Appeared Etiology: Date Acquired: 06/29/2017 Wound Open Weeks Of Treatment: 7 Status: Clustered Wound: No Comorbid Cataracts, Anemia, Chronic Obstructive History: Pulmonary Disease (COPD), Arrhythmia, Hypertension, Type II Diabetes, End Stage Renal Disease, Gout, Rheumatoid Arthritis Wound Measurements Length: (cm) 12.5 Width: (cm) 11.6 Depth: (cm) 0.6 Area: (cm) 333.545 Volume: (  cm) 68.33 % Reduction in Area: 0.7% % Reduction in Volume: -98.6% Epithelialization: None Tunneling: No Undermining: No Wound Description Full Thickness  With Exposed Support Classification: Structures Wound Margin: Distinct, outline attached Exudate Large Amount: Exudate Type: Serosanguineous Exudate Color: red, brown Foul Odor After Cleansing: No Slough/Fibrino Yes Wound Bed Granulation Amount: Medium (34-66%) Exposed Structure Granulation Quality: Red Fascia Exposed: No Necrotic Amount: Medium (34-66%) Fat Layer (Subcutaneous Tissue) Exposed: Yes Necrotic Quality: Eschar, Adherent Slough Tendon Exposed: Yes Muscle Exposed: No Joint Exposed: No Bone Exposed: No Periwound Skin Texture Texture Color No Abnormalities Noted: No No Abnormalities Noted: No Callus: No Atrophie Blanche: No Crepitus: No Cyanosis: No Excoriation: No Ecchymosis: Yes Induration: No Erythema: No Rash: No Hemosiderin Staining: No Scarring: No Mottled: No Alahna, Dunne Cailie F. ([id number]) Moisture Pallor: No No Abnormalities Noted: No Rubor: No Dry / Scaly: No Temperature / Pain Maceration: No Temperature: No Abnormality Tenderness on Palpation: Yes Wound Preparation Ulcer Cleansing: Rinsed/Irrigated with Saline, Other: soap and water, Topical Anesthetic Applied: Other: lidocaine 4%, Treatment Notes Wound #2 (Right, Posterior Lower Leg) 1. Cleansed with: Cleanse wound with antibacterial soap and water 2. Anesthetic Topical Lidocaine 4% cream to wound bed prior to debridement 4. Dressing Applied: Calcium Alginate with Silver Santyl Ointment 5. Secondary Dressing Applied ABD Pad Notes santyl to black areas and silvercel to red areas, kerlix and coban wrap, mepitel and hydrogel to tendon Electronic Signature(s) Signed: 04/28/2018 5:29:24 PM By: Montey Hora Entered By: Montey Hora on 04/28/2018 13:21:11 [name] ([id number]) -------------------------------------------------------------------------------- Wound Assessment Details Patient Name: [name]. Date of Service: 04/28/2018 1:00 PM Medical Record  Number: [id number] Patient Account Number: [id number] Date of Birth/Sex: 12-13-1945 (72 y.o. 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 Wounding Event: Gradually Appeared Etiology: Date Acquired: 06/29/2017 Wound Open Weeks Of Treatment: 7 Status: Clustered Wound: No Comorbid Cataracts, Anemia, Chronic Obstructive History: Pulmonary Disease (COPD), Arrhythmia, Hypertension, Type II Diabetes, End Stage Renal Disease, Gout, Rheumatoid Arthritis Wound Measurements Length: (cm) 2 Width: (cm) 2.1 Depth: (cm) 0.3 Area: (cm) 3.299 Volume: (cm) 0.99 % Reduction in Area: -281.8% % Reduction in Volume: -1051.2% Epithelialization: None Tunneling: No Undermining: No Wound Description Full Thickness Without Exposed Support Foul Odo Classification: Structures Slough/F Wound Margin: Distinct, outline attached Exudate None Present Amount: r After Cleansing: No ibrino Yes Wound Bed Granulation Amount: None Present (0%) Exposed Structure Necrotic Amount: Large (67-100%) Fascia Exposed: No Necrotic Quality: Eschar Fat Layer (Subcutaneous Tissue) Exposed: No Tendon Exposed: No Muscle Exposed: No Joint Exposed: No Bone Exposed: No Periwound Skin Texture Texture Color No Abnormalities Noted: No No Abnormalities Noted: No Callus: No Atrophie Blanche: No Crepitus: No Cyanosis: No Excoriation: No Ecchymosis: Yes Induration: No Erythema: No Rash: No Hemosiderin Staining: No Scarring: No Mottled: No Pallor: No Moisture Rubor: No No Abnormalities Noted: No DILYN, OSORIA. ([id number]) Dry / Scaly: No Temperature / Pain Maceration: No Temperature: No  Abnormality Tenderness on Palpation: Yes Wound Preparation Ulcer Cleansing: Rinsed/Irrigated with Saline, Other: soap and water, Topical Anesthetic Applied: Other: lidocaine 4%, Treatment Notes Wound #3 (Right, Lateral Calcaneus) 1. Cleansed with: Cleanse wound with antibacterial soap and water 2. Anesthetic Topical Lidocaine 4% cream to wound bed prior to debridement 4. Dressing Applied: Calcium Alginate with Silver Santyl Ointment 5. Secondary Dressing Applied  ABD Pad Notes santyl to black areas and silvercel to red areas, kerlix and coban wrap, mepitel and hydrogel to tendon Electronic Signature(s) Signed: 04/28/2018 5:29:24 PM By: Montey Hora Entered By: Montey Hora on 04/28/2018 13:22:10 [name] ([id number]) -------------------------------------------------------------------------------- Wound Assessment Details Patient Name: [name]. Date of Service: 04/28/2018 1:00 PM Medical Record Number: [id number] Patient Account Number: [id number] Date of Birth/Sex: 01-21-1946 (72 y.o. F) Treating RN: Montey Hora Primary Care Anabelle Bungert: Sharilyn Sites Other Clinician: Referring Angelyne Terwilliger: Sharilyn Sites Treating Lindell Renfrew/Extender: Tito Dine in Treatment: 7 Wound Status Wound Number: 4 Primary Diabetic Wound/Ulcer of the Lower Extremity Etiology: Wound Location: Left Metatarsal head first - Dorsal Wound Open Wounding Event: Pressure Injury Status: Date Acquired: 04/05/2018 Comorbid Cataracts, Anemia, Chronic Obstructive Weeks Of Treatment: 3 History: Pulmonary Disease (COPD), Arrhythmia, Clustered Wound: No Hypertension, Type II Diabetes, End Stage Renal Disease, Gout, Rheumatoid Arthritis Wound Measurements Length: (cm) 0.5 Width: (cm) 0.5 Depth: (cm) 0.1 Area: (cm) 0.196 Volume: (cm) 0.02 % Reduction in Area: 87.5% % Reduction in Volume: 87.3% Epithelialization: None Tunneling: No Undermining: No Wound  Description Classification: Grade 1 Wound Margin: Distinct, outline attached Exudate Amount: None Present Foul Odor After Cleansing: Yes Due to Product Use: No Slough/Fibrino Yes Wound Bed Granulation Amount: None Present (0%) Exposed Structure Necrotic Amount: Large (67-100%) Fascia Exposed: No Necrotic Quality: Eschar Fat Layer (Subcutaneous Tissue) Exposed: Yes Tendon Exposed: No Muscle Exposed: No Joint Exposed: No Bone Exposed: No Periwound Skin Texture Texture Color No Abnormalities Noted: No No Abnormalities Noted: No Callus: No Atrophie Blanche: No Crepitus: No Cyanosis: No Excoriation: No Ecchymosis: No Induration: No Erythema: No Rash: No Hemosiderin Staining: No Scarring: No Mottled: No Pallor: No Moisture Rubor: No No Abnormalities Noted: No Dry / Scaly: No Temperature / Pain Maceration: No Temperature: No Abnormality Thurman, Taylyn F. ([id number]) Tenderness on Palpation: Yes Wound Preparation Ulcer Cleansing: Rinsed/Irrigated with Saline, Other: soap and water, Topical Anesthetic Applied: Other: lidocaine 4%, Treatment Notes Wound #4 (Left, Dorsal Metatarsal head first) 1. Cleansed with: Cleanse wound with antibacterial soap and water 2. Anesthetic Topical Lidocaine 4% cream to wound bed prior to debridement 4. Dressing Applied: Calcium Alginate with Silver Santyl Ointment 5. Secondary Dressing Applied ABD Pad Notes santyl to black areas and silvercel to red areas, kerlix and coban wrap, mepitel and hydrogel to tendon Electronic Signature(s) Signed: 04/28/2018 5:29:24 PM By: Montey Hora Entered By: Montey Hora on 04/28/2018 13:23:17 [name] ([id number]) -------------------------------------------------------------------------------- Wound Assessment Details Patient Name: [name]. Date of Service: 04/28/2018 1:00 PM Medical Record Number: [id number] Patient Account Number: [id number] Date of Birth/Sex:  [birth year] (72 y.o. F) Treating RN: Montey Hora Primary Care Ranjit Ashurst: Sharilyn Sites Other Clinician: Referring Lynia Landry: Sharilyn Sites Treating Italia Wolfert/Extender: Tito Dine in Treatment: 7 Wound Status Wound Number: 5 Primary Diabetic Wound/Ulcer of the Lower Extremity Etiology: Wound Location: Left Toe Third Wound Open Wounding Event: Pressure Injury Status: Date Acquired: 04/05/2018 Comorbid Cataracts, Anemia, Chronic Obstructive Weeks Of Treatment: 3 History: Pulmonary Disease (COPD), Arrhythmia, Clustered Wound: No Hypertension, Type II Diabetes, End Stage Renal Disease, Gout, Rheumatoid Arthritis Wound Measurements Length: (cm) 2 Width: (cm) 5 Depth: (cm) 0.1 Area: (cm) 7.854 Volume: (cm) 0.785 % Reduction in Area: -108.3% % Reduction in Volume: -108.2% Epithelialization: None Tunneling: No Undermining: No Wound Description Classification: Grade 1 Wound Margin: Distinct, outline attached Exudate Amount: Medium Exudate Type: Purulent Exudate Color: yellow, brown, green Foul Odor After Cleansing: Yes Due to Product Use: No Slough/Fibrino Yes Wound Bed  Granulation Amount: None Present (0%) Exposed Structure Necrotic Amount: Large (67-100%) Fascia Exposed: No Necrotic Quality: Eschar, Adherent Slough Fat Layer (Subcutaneous Tissue) Exposed: No Tendon Exposed: No Muscle Exposed: No Joint Exposed: No Bone Exposed: No Periwound Skin Texture Texture Color No Abnormalities Noted: No No Abnormalities Noted: No Callus: No Atrophie Blanche: No Crepitus: No Cyanosis: No Excoriation: No Ecchymosis: No Induration: No Erythema: No Rash: No Hemosiderin Staining: No Scarring: No Mottled: No Pallor: No Moisture Rubor: No No Abnormalities Noted: No Dry / Scaly: No Temperature / Pain Telfair, Pricella F. ([id number]) Maceration: No Temperature: No Abnormality Tenderness on Palpation: Yes Wound Preparation Ulcer  Cleansing: Rinsed/Irrigated with Saline, Other: soap and water, Topical Anesthetic Applied: Other: lidocaine 4%, Treatment Notes Wound #5 (Left Toe Third) 1. Cleansed with: Clean wound with Normal Saline 2. Anesthetic Topical Lidocaine 4% cream to wound bed prior to debridement 4. Dressing Applied: Calcium Alginate with Silver Notes silvercell, conform wrap Electronic Signature(s) Signed: 04/28/2018 5:29:24 PM By: Montey Hora Entered By: Montey Hora on 04/28/2018 13:24:15 [name] ([id number]) -------------------------------------------------------------------------------- Wound Assessment Details Patient Name: [name]. Date of Service: 04/28/2018 1:00 PM Medical Record Number: [id number] Patient Account Number: [id number] Date of Birth/Sex: 1946/08/07 (72 y.o. F) Treating RN: Montey Hora Primary Care Avonne Berkery: Sharilyn Sites Other Clinician: Referring Eiliana Drone: Sharilyn Sites Treating Suezette Lafave/Extender: Tito Dine in Treatment: 7 Wound Status Wound Number: 6 Primary Diabetic Wound/Ulcer of the Lower Extremity Etiology: Wound Location: Left Toe - Web between 2nd and 3rd Wound Open Wounding Event: Gradually Appeared Status: Date Acquired: 04/14/2018 Comorbid Cataracts, Anemia, Chronic Obstructive Weeks Of Treatment: 2 History: Pulmonary Disease (COPD), Arrhythmia, Clustered Wound: No Hypertension, Type II Diabetes, End Stage Renal Disease, Gout, Rheumatoid Arthritis Wound Measurements Length: (cm) 3 Width: (cm) 1.5 Depth: (cm) 0.1 Area: (cm) 3.534 Volume: (cm) 0.353 % Reduction in Area: 66.7% % Reduction in Volume: 66.7% Epithelialization: None Tunneling: No Undermining: No Wound Description Classification: Grade 1 Wound Margin: Flat and Intact Exudate Amount: Large Exudate Type: Purulent Exudate Color: yellow, brown, green Foul Odor After Cleansing: No Slough/Fibrino Yes Wound Bed Granulation Amount: None  Present (0%) Exposed Structure Necrotic Amount: Large (67-100%) Fascia Exposed: No Necrotic Quality: Adherent Slough Fat Layer (Subcutaneous Tissue) Exposed: No Tendon Exposed: No Muscle Exposed: No Joint Exposed: No Bone Exposed: No Periwound Skin Texture Texture Color No Abnormalities Noted: No No Abnormalities Noted: No Moisture Temperature / Pain No Abnormalities Noted: No Temperature: No Abnormality Maceration: Yes Tenderness on Palpation: Yes Wound Preparation Ulcer Cleansing: Rinsed/Irrigated with Saline, Other: soap and water, Topical Anesthetic Applied: Other: lidocaine 4%, Treatment Notes COREEN, SHIPPEE. ([id number]) Wound #6 (Left Toe - Web between 2nd and 3rd) 1. Cleansed with: Clean wound with Normal Saline 2. Anesthetic Topical Lidocaine 4% cream to wound bed prior to debridement 4. Dressing Applied: Calcium Alginate with Silver Notes silvercell, conform wrap Electronic Signature(s) Signed: 04/28/2018 5:29:24 PM By: Montey Hora Entered By: Montey Hora on 04/28/2018 13:25:19 [name] ([id number]) -------------------------------------------------------------------------------- Wound Assessment Details Patient Name: [name]. Date of Service: 04/28/2018 1:00 PM Medical Record Number: [id number] Patient Account Number: [id number] Date of Birth/Sex: 1946-04-19 (72 y.o. F) Treating RN: Montey Hora Primary Care Jeshua Ransford: Sharilyn Sites Other Clinician: Referring Bijan Ridgley: Sharilyn Sites Treating Lyndzee Kliebert/Extender: Tito Dine in Treatment: 7 Wound Status Wound Number: 7 Primary Diabetic Wound/Ulcer of the Lower Extremity Etiology: Wound Location: Left Toe - Web between 3rd and 4th Wound Open Wounding Event: Gradually Appeared Status: Date Acquired: 04/14/2018 Comorbid Cataracts, Anemia,  Chronic Obstructive Weeks Of Treatment: 2 History: Pulmonary Disease (COPD), Arrhythmia, Clustered Wound: No Hypertension,  Type II Diabetes, End Stage Renal Disease, Gout, Rheumatoid Arthritis Wound Measurements Length: (cm) 2 Width: (cm) 1 Depth: (cm) 0.1 Area: (cm) 1.571 Volume: (cm) 0.157 % Reduction in Area: 61.9% % Reduction in Volume: 61.9% Epithelialization: None Tunneling: No Undermining: No Wound Description Classification: Grade 1 Wound Margin: Flat and Intact Exudate Amount: Large Exudate Type: Purulent Exudate Color: yellow, brown, green Foul Odor After Cleansing: No Slough/Fibrino Yes Wound Bed Granulation Amount: None Present (0%) Exposed Structure Necrotic Amount: Large (67-100%) Fascia Exposed: No Necrotic Quality: Adherent Slough Fat Layer (Subcutaneous Tissue) Exposed: No Tendon Exposed: No Muscle Exposed: No Joint Exposed: No Bone Exposed: No Periwound Skin Texture Texture Color No Abnormalities Noted: No No Abnormalities Noted: No Moisture Temperature / Pain No Abnormalities Noted: No Temperature: No Abnormality Maceration: Yes Tenderness on Palpation: Yes Wound Preparation Ulcer Cleansing: Rinsed/Irrigated with Saline Topical Anesthetic Applied: Other: lidocaine 4%, Treatment Notes DENIM, START. ([id number]) Wound #7 (Left Toe - Web between 3rd and 4th) 1. Cleansed with: Clean wound with Normal Saline 2. Anesthetic Topical Lidocaine 4% cream to wound bed prior to debridement 4. Dressing Applied: Calcium Alginate with Silver Notes silvercell, conform wrap Electronic Signature(s) Signed: 04/28/2018 5:29:24 PM By: Montey Hora Entered By: Montey Hora on 04/28/2018 13:25:53 [name] ([id number]) -------------------------------------------------------------------------------- Wound Assessment Details Patient Name: [name]. Date of Service: 04/28/2018 1:00 PM Medical Record Number: [id number] Patient Account Number: [id number] Date of Birth/Sex: 1946/02/10 (72 y.o. F) Treating RN: Montey Hora Primary Care Reinette Cuneo:  Sharilyn Sites Other Clinician: Referring Ahsley Attwood: Sharilyn Sites Treating Kasey Ewings/Extender: Tito Dine in Treatment: 7 Wound Status Wound Number: 8 Primary Diabetic Wound/Ulcer of the Lower Extremity Etiology: Wound Location: Toe Great - Dorsal Wound Open Wounding Event: Gradually Appeared Status: Date Acquired: 04/14/2018 Comorbid Cataracts, Anemia, Chronic Obstructive Weeks Of Treatment: 2 History: Pulmonary Disease (COPD), Arrhythmia, Clustered Wound: No Hypertension, Type II Diabetes, End Stage Renal Disease, Gout, Rheumatoid Arthritis Wound Measurements Length: (cm) 0.6 Width: (cm) 0.9 Depth: (cm) 0.1 Area: (cm) 0.424 Volume: (cm) 0.042 % Reduction in Area: 43.8% % Reduction in Volume: 44% Epithelialization: None Tunneling: No Undermining: No Wound Description Classification: Grade 1 Wound Margin: Distinct, outline attached Exudate Amount: None Present Foul Odor After Cleansing: No Slough/Fibrino Yes Wound Bed Granulation Amount: None Present (0%) Exposed Structure Necrotic Amount: Large (67-100%) Fascia Exposed: No Necrotic Quality: Eschar Fat Layer (Subcutaneous Tissue) Exposed: No Tendon Exposed: No Muscle Exposed: No Joint Exposed: No Bone Exposed: No Periwound Skin Texture Texture Color No Abnormalities Noted: No No Abnormalities Noted: No Callus: No Atrophie Blanche: No Crepitus: No Cyanosis: No Excoriation: No Ecchymosis: No Induration: No Erythema: No Rash: No Hemosiderin Staining: No Scarring: No Mottled: No Pallor: No Moisture Rubor: No No Abnormalities Noted: No Dry / Scaly: No Temperature / Pain Maceration: No Temperature: No Abnormality Tenderness on Palpation: Yes Talor, [place] Mahek F. ([id number]) Wound Preparation Ulcer Cleansing: Rinsed/Irrigated with Saline, Other: soap and water, Topical Anesthetic Applied: Other: lidocaine 4%, Treatment Notes Wound #8 (Dorsal Toe Great) 1. Cleansed with: Clean wound  with Normal Saline 2. Anesthetic Topical Lidocaine 4% cream to wound bed prior to debridement 4. Dressing Applied: Calcium Alginate with Silver Notes silvercell, conform wrap Electronic Signature(s) Signed: 04/28/2018 5:29:24 PM By: Montey Hora Entered By: Montey Hora on 04/28/2018 13:26:39 [name] ([id number]) -------------------------------------------------------------------------------- Wound Assessment Details Patient Name: [name]. Date of Service: 04/28/2018 1:00 PM Medical Record Number:  [id number] Patient Account Number: [id number] Date of Birth/Sex: 08-Mar-1946 (72 y.o. F) Treating RN: Montey Hora Primary Care Joash Tony: Sharilyn Sites Other Clinician: Referring Lisia Westbay: Sharilyn Sites Treating Glenora Morocho/Extender: Tito Dine in Treatment: 7 Wound Status Wound Number: 9 Primary Diabetic Wound/Ulcer of the Lower Extremity Etiology: Wound Location: Right Toe - Web between 2nd and 3rd Wound Open Wounding Event: Gradually Appeared Status: Date Acquired: 04/14/2018 Comorbid Cataracts, Anemia, Chronic Obstructive Weeks Of Treatment: 2 History: Pulmonary Disease (COPD), Arrhythmia, Clustered Wound: No Hypertension, Type II Diabetes, End Stage Renal Disease, Gout, Rheumatoid Arthritis Wound Measurements Length: (cm) 2 Width: (cm) 1.5 Depth: (cm) 0.1 Area: (cm) 2.356 Volume: (cm) 0.236 % Reduction in Area: 42.9% % Reduction in Volume: 42.7% Epithelialization: None Tunneling: No Undermining: No Wound Description Classification: Grade 1 Wound Margin: Distinct, outline attached Exudate Amount: None Present Foul Odor After Cleansing: No Slough/Fibrino Yes Wound Bed Granulation Amount: None Present (0%) Exposed Structure Necrotic Amount: Large (67-100%) Fascia Exposed: No Necrotic Quality: Eschar Fat Layer (Subcutaneous Tissue) Exposed: No Tendon Exposed: No Muscle Exposed: No Joint Exposed: No Bone Exposed:  No Periwound Skin Texture Texture Color No Abnormalities Noted: No No Abnormalities Noted: No Moisture Temperature / Pain No Abnormalities Noted: No Temperature: No Abnormality Maceration: Yes Tenderness on Palpation: Yes Wound Preparation Ulcer Cleansing: Rinsed/Irrigated with Saline, Other: soap and water, Topical Anesthetic Applied: Other: lidocaine 4%, Treatment Notes Wound #9 (Right Toe - Web between 2nd and 3rd) 1. Cleansed with: AUDRIE, KURI ([id number]) Clean wound with Normal Saline 2. Anesthetic Topical Lidocaine 4% cream to wound bed prior to debridement 4. Dressing Applied: Calcium Alginate with Silver Notes silvercell, conform wrap Electronic Signature(s) Signed: 04/28/2018 5:29:24 PM By: Montey Hora Entered By: Montey Hora on 04/28/2018 13:27:18 [name] ([id number]) -------------------------------------------------------------------------------- [place] Details Patient Name: [name]. Date of Service: 04/28/2018 1:00 PM Medical Record Number: [id number] Patient Account Number: [id number] Date of Birth/Sex: [birth year] (72 y.o. F) Treating RN: Montey Hora Primary Care Louise Victory: Sharilyn Sites Other Clinician: Referring Eyoel Throgmorton: Sharilyn Sites Treating Toni Hoffmeister/Extender: Tito Dine in Treatment: 7 Vital Signs Time Taken: 13:10 Temperature (F): 97.7 Height (in): 65 Pulse (bpm): 64 Weight (lbs): 164 Respiratory Rate (breaths/min): 16 Body Mass Index (BMI): 27.3 Blood Pressure (mmHg): 110/36 Reference Range: 80 - 120 mg / dl Electronic Signature(s) Signed: 04/28/2018 5:29:24 PM By: Montey Hora Entered By: Montey Hora on 04/28/2018 13:11:52

## 2018-05-10 DIAGNOSIS — M150 Primary generalized (osteo)arthritis: Secondary | ICD-10-CM | POA: Diagnosis not present

## 2018-05-10 DIAGNOSIS — M329 Systemic lupus erythematosus, unspecified: Secondary | ICD-10-CM | POA: Diagnosis not present

## 2018-05-10 DIAGNOSIS — E785 Hyperlipidemia, unspecified: Secondary | ICD-10-CM | POA: Diagnosis not present

## 2018-05-10 DIAGNOSIS — J449 Chronic obstructive pulmonary disease, unspecified: Secondary | ICD-10-CM | POA: Diagnosis not present

## 2018-05-10 DIAGNOSIS — E1122 Type 2 diabetes mellitus with diabetic chronic kidney disease: Secondary | ICD-10-CM | POA: Diagnosis not present

## 2018-05-10 DIAGNOSIS — I4891 Unspecified atrial fibrillation: Secondary | ICD-10-CM | POA: Diagnosis not present

## 2018-05-10 DIAGNOSIS — Z87891 Personal history of nicotine dependence: Secondary | ICD-10-CM | POA: Diagnosis not present

## 2018-05-10 DIAGNOSIS — H409 Unspecified glaucoma: Secondary | ICD-10-CM | POA: Diagnosis not present

## 2018-05-10 DIAGNOSIS — M109 Gout, unspecified: Secondary | ICD-10-CM | POA: Diagnosis not present

## 2018-05-10 DIAGNOSIS — Z794 Long term (current) use of insulin: Secondary | ICD-10-CM | POA: Diagnosis not present

## 2018-05-10 DIAGNOSIS — Z992 Dependence on renal dialysis: Secondary | ICD-10-CM | POA: Diagnosis not present

## 2018-05-10 DIAGNOSIS — N186 End stage renal disease: Secondary | ICD-10-CM | POA: Diagnosis not present

## 2018-05-10 DIAGNOSIS — I872 Venous insufficiency (chronic) (peripheral): Secondary | ICD-10-CM | POA: Diagnosis not present

## 2018-05-11 DIAGNOSIS — D631 Anemia in chronic kidney disease: Secondary | ICD-10-CM | POA: Diagnosis not present

## 2018-05-11 DIAGNOSIS — D689 Coagulation defect, unspecified: Secondary | ICD-10-CM | POA: Diagnosis not present

## 2018-05-11 DIAGNOSIS — N186 End stage renal disease: Secondary | ICD-10-CM | POA: Diagnosis not present

## 2018-05-11 DIAGNOSIS — D509 Iron deficiency anemia, unspecified: Secondary | ICD-10-CM | POA: Diagnosis not present

## 2018-05-11 DIAGNOSIS — E1129 Type 2 diabetes mellitus with other diabetic kidney complication: Secondary | ICD-10-CM | POA: Diagnosis not present

## 2018-05-11 DIAGNOSIS — N2581 Secondary hyperparathyroidism of renal origin: Secondary | ICD-10-CM | POA: Diagnosis not present

## 2018-05-11 DIAGNOSIS — E876 Hypokalemia: Secondary | ICD-10-CM | POA: Diagnosis not present

## 2018-05-12 ENCOUNTER — Ambulatory Visit (INDEPENDENT_AMBULATORY_CARE_PROVIDER_SITE_OTHER): Payer: Medicare Other | Admitting: "Endocrinology

## 2018-05-12 ENCOUNTER — Encounter: Payer: Self-pay | Admitting: "Endocrinology

## 2018-05-12 VITALS — BP 116/65 | HR 78 | Ht 64.0 in

## 2018-05-12 DIAGNOSIS — N186 End stage renal disease: Secondary | ICD-10-CM | POA: Diagnosis not present

## 2018-05-12 DIAGNOSIS — I4891 Unspecified atrial fibrillation: Secondary | ICD-10-CM | POA: Diagnosis not present

## 2018-05-12 DIAGNOSIS — N184 Chronic kidney disease, stage 4 (severe): Secondary | ICD-10-CM | POA: Diagnosis not present

## 2018-05-12 DIAGNOSIS — E782 Mixed hyperlipidemia: Secondary | ICD-10-CM

## 2018-05-12 DIAGNOSIS — E785 Hyperlipidemia, unspecified: Secondary | ICD-10-CM | POA: Diagnosis not present

## 2018-05-12 DIAGNOSIS — I10 Essential (primary) hypertension: Secondary | ICD-10-CM | POA: Diagnosis not present

## 2018-05-12 DIAGNOSIS — I872 Venous insufficiency (chronic) (peripheral): Secondary | ICD-10-CM | POA: Diagnosis not present

## 2018-05-12 DIAGNOSIS — Z87891 Personal history of nicotine dependence: Secondary | ICD-10-CM | POA: Diagnosis not present

## 2018-05-12 DIAGNOSIS — M150 Primary generalized (osteo)arthritis: Secondary | ICD-10-CM | POA: Diagnosis not present

## 2018-05-12 DIAGNOSIS — Z794 Long term (current) use of insulin: Secondary | ICD-10-CM | POA: Diagnosis not present

## 2018-05-12 DIAGNOSIS — J449 Chronic obstructive pulmonary disease, unspecified: Secondary | ICD-10-CM | POA: Diagnosis not present

## 2018-05-12 DIAGNOSIS — H409 Unspecified glaucoma: Secondary | ICD-10-CM | POA: Diagnosis not present

## 2018-05-12 DIAGNOSIS — Z992 Dependence on renal dialysis: Secondary | ICD-10-CM | POA: Diagnosis not present

## 2018-05-12 DIAGNOSIS — E1122 Type 2 diabetes mellitus with diabetic chronic kidney disease: Secondary | ICD-10-CM

## 2018-05-12 DIAGNOSIS — M109 Gout, unspecified: Secondary | ICD-10-CM | POA: Diagnosis not present

## 2018-05-12 DIAGNOSIS — M329 Systemic lupus erythematosus, unspecified: Secondary | ICD-10-CM | POA: Diagnosis not present

## 2018-05-12 NOTE — Patient Instructions (Signed)

## 2018-05-12 NOTE — Progress Notes (Signed)
Endocrinology follow-up note  Subjective:    Patient ID: [name], female    DOB: 25-Apr-1946,    Past Medical History:  Diagnosis Date  . Anemia   . Arthritis   . Asthma   . Atrial fibrillation ([place])   . Atrial flutter [facility])    TEE/DCCV December 2013 - on Xarelto [facility])  . Bone spur of toe    Right 5th toe  . Chronic diastolic CHF (congestive heart failure) (HCC)    a. EF 50-55% by echo in 2015  . CKD (chronic kidney disease) stage 4, GFR 15-29 ml/min (HCC)    a. s/p fistula placement but not on HD. Followed by Nephrology.   . Colon polyposis   . COPD (chronic obstructive pulmonary disease) ([place])   . DVT of upper extremity (deep vein thrombosis) ([place])    Right basilic vein, June 0160  . Essential hypertension, benign   . Fractures   . Glaucoma   . SLE (systemic lupus erythematosus) ([place])   . Type 2 diabetes mellitus ([place])   . UTI (lower urinary tract infection)   . Wound of right leg 06/2017   Past Surgical History:  Procedure Laterality Date  . ABDOMINAL AORTOGRAM W/LOWER EXTREMITY N/A 12/18/2017   Procedure: ABDOMINAL AORTOGRAM W/LOWER EXTREMITY;  Surgeon: Elam Dutch, MD;  Location: [facility];  Service: Cardiovascular;  Laterality: N/A;  bilateral  . ABDOMINAL HYSTERECTOMY  1983  . ANKLE SURGERY  1993  . AV FISTULA PLACEMENT Left 03/27/2014   Procedure: ARTERIOVENOUS (AV) FISTULA CREATION;  Surgeon: Rosetta Posner, MD;  Location: [place];  Service: Vascular;  Laterality: Left;  . BACK SURGERY  1980  . BASCILIC VEIN TRANSPOSITION Left 01/13/2018   Procedure: LEFT BASILIC VEIN TRANSPOSITION SECOND STAGE;  Surgeon: Elam Dutch, MD;  Location: [place];  Service: Vascular;  Laterality: Left;  . CARDIOVASCULAR STRESS TEST  12/19/2009   no stress induced rhythm abnormalities, ekg negative for ischemia  . CARDIOVERSION  09/16/2012   Procedure: CARDIOVERSION;  Surgeon: Sanda Klein, MD;  Location: [facility];  Service: Cardiovascular;  Laterality:  N/A;  . CATARACT EXTRACTION    . COLONOSCOPY  09/20/2012   Dr. [name]: multiple tubular adenomas   . COLONOSCOPY  2005   Dr. [name]. Polyps, path unknown   . COLONOSCOPY N/A 07/24/2016   Procedure: COLONOSCOPY;  Surgeon: [name], MD;  [facility];  Service: Endoscopy;  Laterality: N/A;  9:00 am  . ESOPHAGOGASTRODUODENOSCOPY  09/20/2012   Dr. [name]: duodenal erosion and possible resolving ulcer at the angularis   . ESOPHAGOGASTRODUODENOSCOPY N/A 03/24/2018   Procedure: ESOPHAGOGASTRODUODENOSCOPY (EGD);  Surgeon: [name], MD;  [facility];  Service: Endoscopy;  Laterality: N/A;  12:45pm- moved up to arrive at 8:30am per Tretha Sciara  . EYE SURGERY    . IR FLUORO GUIDE CV LINE RIGHT  11/09/2017  . IR US GUIDE VASC ACCESS RIGHT  11/09/2017  . REVISON OF ARTERIOVENOUS FISTULA Left 03/15/2018   Procedure: REVISION OF Left arm BASILIC VEIN TRANSPOSITION;  Surgeon: Angelia Mould, MD;  Location: [place];  Service: Vascular;  Laterality: Left;  . TEE WITHOUT CARDIOVERSION  09/16/2012   Procedure: TRANSESOPHAGEAL ECHOCARDIOGRAM (TEE);  Surgeon: Sanda Klein, MD;  Location: [facility] ENDOSCOPY;  Service: Cardiovascular;  Laterality: N/A;  . TRANSESOPHAGEAL ECHOCARDIOGRAM WITH CARDIOVERSION  09/16/2012   EF 60-65%, moderate LVH, moderate regurg of the aortic valve, LA moderately dilated   Social History   Socioeconomic History  .  Marital status: Married    Spouse name: Not on file  . Number of children: Not on file  . Years of education: Not on file  . Highest education level: Not on file  Occupational History  . Not on file  Social Needs  . Financial resource strain: Not on file  . Food insecurity:    Worry: Not on file    Inability: Not on file  . Transportation needs:    Medical: Not on file    Non-medical: Not on file  Tobacco Use  . Smoking status: Former Smoker    Packs/day: 1.00    Years: 30.00    Pack years: 30.00    Types: Cigarettes    Last  attempt to quit: 08/29/2012    Years since quitting: 5.7  . Smokeless tobacco: Never Used  . Tobacco comment: quit 08-2012  Substance and Sexual Activity  . Alcohol use: No    Alcohol/week: 0.0 standard drinks  . Drug use: No  . Sexual activity: Yes    Partners: Male  Lifestyle  . Physical activity:    Days per week: Not on file    Minutes per session: Not on file  . Stress: Not on file  Relationships  . Social connections:    Talks on phone: Not on file    Gets together: Not on file    Attends religious service: Not on file    Active member of club or organization: Not on file    Attends meetings of clubs or organizations: Not on file    Relationship status: Not on file  Other Topics Concern  . Not on file  Social History Narrative  . Not on file   Outpatient Encounter Medications as of 05/12/2018  Medication Sig  . acetaminophen (TYLENOL) 500 MG tablet Take 1,000 mg by mouth every 6 (six) hours as needed. For pain  . albuterol (ACCUNEB) 0.63 MG/3ML nebulizer solution Take 1 ampule by nebulization every 6 (six) hours as needed for wheezing or shortness of breath.   Marland Kitchen albuterol (PROVENTIL HFA;VENTOLIN HFA) 108 (90 BASE) MCG/ACT inhaler Inhale 2 puffs into the lungs every 6 (six) hours as needed. For shortness of breath  . Amino Acids-Protein Hydrolys (FEEDING SUPPLEMENT, PRO-STAT SUGAR FREE 64,) LIQD Take 30 mLs by mouth 2 (two) times daily. (Patient taking differently: Take 30 mLs by mouth 3 (three) times daily with meals. )  . apixaban (ELIQUIS) 5 MG TABS tablet Take 1 tablet (5 mg total) by mouth 2 (two) times daily.  Marland Kitchen atorvastatin (LIPITOR) 20 MG tablet Take 20 mg by mouth at bedtime.   . brimonidine-timolol (COMBIGAN) 0.2-0.5 % ophthalmic solution Place 1 drop into the left eye daily.   . calcium acetate (PHOSLO) 667 MG capsule Take 667 mg by mouth 3 (three) times daily with meals.  . dicyclomine (BENTYL) 10 MG capsule Take 10 mg by mouth 4 (four) times daily -  before meals  and at bedtime.  . febuxostat (ULORIC) 40 MG tablet Take 40 mg by mouth daily.  . folic acid (FOLVITE) 1 MG tablet Take 1 mg by mouth daily.  . hydroxychloroquine (PLAQUENIL) 200 MG tablet Take 300 mg by mouth as directed. Take 1 1/2 tablets daily  . Insulin Detemir (LEVEMIR FLEXTOUCH) 100 UNIT/ML Pen Inject 10 Units into the skin every morning.  . insulin lispro (HUMALOG KWIKPEN) 100 UNIT/ML KiwkPen Inject 5 Units into the skin 3 (three) times daily with meals as needed (*Only take if blood sugar levels are  over 150).  . metoprolol tartrate (LOPRESSOR) 50 MG tablet Take 50 mg by mouth See admin instructions. Take 1 tablet twice a day, except on Tuesday, Thursday, Saturday, only take 1 tablet daily in the evening  . Multiple Vitamins-Minerals (CENTRUM SILVER PO) Take 1 tablet by mouth daily.  Marland Kitchen oxyCODONE (ROXICODONE) 5 MG immediate release tablet Take 1 tablet (5 mg total) by mouth every 4 (four) hours as needed for severe pain.  Marland Kitchen oxyCODONE-acetaminophen (PERCOCET/ROXICET) 5-325 MG tablet Take 1 tablet by mouth every 6 (six) hours as needed for severe pain.  . pantoprazole (PROTONIX) 40 MG tablet Take 1 tablet (40 mg total) by mouth daily.  . predniSONE (DELTASONE) 5 MG tablet Take 5 mg by mouth daily with breakfast.  . silver sulfADIAZINE (SILVADENE) 1 % cream Apply 1 application topically daily.  . travoprost, benzalkonium, (TRAVATAN) 0.004 % ophthalmic solution Place 1 drop into the left eye at bedtime.   No facility-administered encounter medications on file as of 05/12/2018.    ALLERGIES: Allergies  Allergen Reactions  . Ace Inhibitors Cough   VACCINATION STATUS: Immunization History  Administered Date(s) Administered  . Influenza,inj,Quad PF,6+ Mos 07/03/2014  . Influenza-Unspecified 07/11/2015, 07/08/2016  . PPD Test 11/07/2017  . Pneumococcal Polysaccharide-23 11/04/2017    Diabetes  She presents for her follow-up diabetic visit. She has type 2 diabetes mellitus. Onset time:  diagnosed at approx age of 84. Her disease course has been worsening. Pertinent negatives for hypoglycemia include no confusion, headaches, pallor or seizures. Pertinent negatives for diabetes include no chest pain, no fatigue, no polydipsia, no polyphagia and no polyuria. Symptoms are stable. Diabetic complications include nephropathy. Risk factors for coronary artery disease include dyslipidemia, diabetes mellitus and obesity. Current diabetic treatment includes insulin injections. She is compliant with treatment most of the time. Her weight is fluctuating minimally. She is following a generally unhealthy diet. She has had a previous visit with a dietitian. She never participates in exercise. Her home blood glucose trend is increasing steadily. Her breakfast blood glucose range is generally 140-180 mg/dl. Her lunch blood glucose range is generally 180-200 mg/dl. Her dinner blood glucose range is generally 180-200 mg/dl. Her bedtime blood glucose range is generally 180-200 mg/dl. Her overall blood glucose range is 180-200 mg/dl. An ACE inhibitor/angiotensin II receptor blocker is being taken.  Hypertension  This is a chronic problem. The current episode started more than 1 year ago. The problem is controlled. Pertinent negatives include no chest pain, headaches, palpitations or shortness of breath. Risk factors for coronary artery disease include obesity, dyslipidemia, diabetes mellitus and sedentary lifestyle. Hypertensive end-organ damage includes kidney disease. Identifiable causes of hypertension include chronic renal disease.  Hyperlipidemia  This is a chronic problem. The current episode started more than 1 year ago. Exacerbating diseases include chronic renal disease, diabetes and obesity. Pertinent negatives include no chest pain, myalgias or shortness of breath. Current antihyperlipidemic treatment includes statins. Risk factors for coronary artery disease include diabetes mellitus, dyslipidemia,  hypertension, a sedentary lifestyle, family history and post-menopausal.    Review of Systems  Constitutional: Negative for fatigue and unexpected weight change.  HENT: Negative for trouble swallowing and voice change.   Eyes: Negative for visual disturbance.  Respiratory: Negative for cough, shortness of breath and wheezing.   Cardiovascular: Negative for chest pain, palpitations and leg swelling.  Gastrointestinal: Negative for diarrhea, nausea and vomiting.  Endocrine: Negative for cold intolerance, heat intolerance, polydipsia, polyphagia and polyuria.  Musculoskeletal: Negative for arthralgias and myalgias.  Skin: Negative  for color change, pallor, rash and wound.  Neurological: Negative for seizures and headaches.  Psychiatric/Behavioral: Negative for confusion and suicidal ideas.    Objective:    BP 116/65   Pulse 78   Ht 5\' 4"  (1.626 m)   BMI 30.04 kg/m   Wt Readings from Last 3 Encounters:  04/21/18 175 lb (79.4 kg)  03/31/18 175 lb (79.4 kg)  03/24/18 175 lb (79.4 kg)    Physical Exam  Constitutional: She is oriented to person, place, and time. She appears well-developed.  Wheelchair-bound due to disequilibrium.  HENT:  Head: Normocephalic and atraumatic.  Eyes: EOM are normal.  Neck: Normal range of motion. Neck supple. No tracheal deviation present. No thyromegaly present.  Cardiovascular: Normal rate.  Pulmonary/Chest: Effort normal.  Abdominal: There is no tenderness. There is no guarding.  Musculoskeletal: She exhibits no edema.  Wheelchair-bound due to disequilibrium.  Neurological: She is alert and oriented to person, place, and time. She has normal reflexes. No cranial nerve deficit. Coordination normal.  Skin: Skin is warm and dry. No rash noted. No erythema. No pallor.  Psychiatric: She has a normal mood and affect. Judgment normal.    Chemistry (most recent): Lab Results  Component Value Date   NA 136 05/05/2018   K 3.1 (L) 05/05/2018   CL 92  (L) 05/05/2018   CO2 23 05/05/2018   BUN 50 (H) 05/05/2018   CREATININE 4.54 (H) 05/05/2018   Diabetic Labs (most recent): Lab Results  Component Value Date   HGBA1C 6.2 (H) 05/05/2018   HGBA1C 5.2 03/01/2018   HGBA1C 5.3 01/20/2018      Assessment & Plan:   1. Type 2 diabetes mellitus with stage 5 chronic kidney disease- status post fistula placement for hemodialysis. - She is not started on hemodialysis. -She is advised to stick to a routine mealtimes to eat 3 meals  a day and avoid unnecessary snacks ( to snack only to correct hypoglycemia). - Her recent labs show A1c of 6.2%.  Her anemia is treated to hemoglobin of 13.3. -   Her postprandial blood glucose readings are significantly above target .  Patient is advised to eliminate simple carbs  from their diet including cakes desserts ice cream soda (  diet and regular) , sweet tea , Candies,  chips and cookies, artificial sweeteners,   and "sugar-free" products .  This will help patient to have stable blood glucose profile and potentially lose weight. Patient is given detailed personalized glucose monitoring and insulin dosing instructions. Patient is instructed to call back with extremes of blood glucose less than 70 or greater than 300. Patient to bring meter and  blood glucose logs during their next visit.  I advised her to increase Levemir to 10 units every morning with breakfast, readjust her Humalog to start 5 units when her pre-meal blood glucose is 100 mg/dL or above.  She is advised to continue strict monitoring of blood glucose 4 times a day-before meals and at bedtime.   -She is advised to call clinic if blood glucose readings are less than 70 or greater than 3003.  2. Essential hypertension, benign - Her blood pressure is Controlled to target.  I have advised her to continue current medications including metoprolol 50 mg p.o. twice daily.    3. Hyperlipemia No recent fasting lipid panel.  She is advised to continue  atorvastatin 20 mg p.o. Nightly.    4. Morbid obesity Carbs advise given. See above #1.  5. vitamin D deficiency.  -  Continue current Vitamin D supplement.  6) Chronic Care/Health Maintenance:  -Patient is on  Statin medications and encouraged to continue to follow up with Ophthalmology, nephrology for end-stage renal disease status post fistula placement, Podiatrist at least yearly or according to recommendations, and advised to   stay away from smoking. I have recommended yearly flu vaccine and pneumonia vaccination at least every 5 years; and  sleep for at least 7 hours a day.  - Time spent with the patient: 25 min, of which >50% was spent in reviewing her blood glucose logs , discussing her hypo- and hyper-glycemic episodes, reviewing her current and  previous labs and insulin doses and developing a plan to avoid hypo- and hyper-glycemia. Please refer to Patient Instructions for Blood Glucose Monitoring and Insulin/Medications Dosing Guide"  in media tab for additional information. [name] participated in the discussions, expressed understanding, and voiced agreement with the above plans.  All questions were answered to her satisfaction. she is encouraged to contact clinic should she have any questions or concerns prior to her return visit.   Follow up plan: Return in about 3 months (around 08/12/2018) for Meter, and Logs.  Glade Lloyd, MD Phone: [phone]  Fax: [phone]  -  This note was partially dictated with voice recognition software. Similar sounding words can be transcribed inadequately or may not  be corrected upon review.  05/12/2018, 1:22 PM

## 2018-05-13 DIAGNOSIS — E876 Hypokalemia: Secondary | ICD-10-CM | POA: Diagnosis not present

## 2018-05-13 DIAGNOSIS — D631 Anemia in chronic kidney disease: Secondary | ICD-10-CM | POA: Diagnosis not present

## 2018-05-13 DIAGNOSIS — N2581 Secondary hyperparathyroidism of renal origin: Secondary | ICD-10-CM | POA: Diagnosis not present

## 2018-05-13 DIAGNOSIS — D509 Iron deficiency anemia, unspecified: Secondary | ICD-10-CM | POA: Diagnosis not present

## 2018-05-13 DIAGNOSIS — D689 Coagulation defect, unspecified: Secondary | ICD-10-CM | POA: Diagnosis not present

## 2018-05-13 DIAGNOSIS — E1129 Type 2 diabetes mellitus with other diabetic kidney complication: Secondary | ICD-10-CM | POA: Diagnosis not present

## 2018-05-13 DIAGNOSIS — N186 End stage renal disease: Secondary | ICD-10-CM | POA: Diagnosis not present

## 2018-05-14 DIAGNOSIS — Z87891 Personal history of nicotine dependence: Secondary | ICD-10-CM | POA: Diagnosis not present

## 2018-05-14 DIAGNOSIS — I4891 Unspecified atrial fibrillation: Secondary | ICD-10-CM | POA: Diagnosis not present

## 2018-05-14 DIAGNOSIS — Z794 Long term (current) use of insulin: Secondary | ICD-10-CM | POA: Diagnosis not present

## 2018-05-14 DIAGNOSIS — E1122 Type 2 diabetes mellitus with diabetic chronic kidney disease: Secondary | ICD-10-CM | POA: Diagnosis not present

## 2018-05-14 DIAGNOSIS — I872 Venous insufficiency (chronic) (peripheral): Secondary | ICD-10-CM | POA: Diagnosis not present

## 2018-05-14 DIAGNOSIS — H409 Unspecified glaucoma: Secondary | ICD-10-CM | POA: Diagnosis not present

## 2018-05-14 DIAGNOSIS — M150 Primary generalized (osteo)arthritis: Secondary | ICD-10-CM | POA: Diagnosis not present

## 2018-05-14 DIAGNOSIS — N186 End stage renal disease: Secondary | ICD-10-CM | POA: Diagnosis not present

## 2018-05-14 DIAGNOSIS — J449 Chronic obstructive pulmonary disease, unspecified: Secondary | ICD-10-CM | POA: Diagnosis not present

## 2018-05-14 DIAGNOSIS — M109 Gout, unspecified: Secondary | ICD-10-CM | POA: Diagnosis not present

## 2018-05-14 DIAGNOSIS — Z992 Dependence on renal dialysis: Secondary | ICD-10-CM | POA: Diagnosis not present

## 2018-05-14 DIAGNOSIS — E785 Hyperlipidemia, unspecified: Secondary | ICD-10-CM | POA: Diagnosis not present

## 2018-05-14 DIAGNOSIS — M329 Systemic lupus erythematosus, unspecified: Secondary | ICD-10-CM | POA: Diagnosis not present

## 2018-05-15 DIAGNOSIS — N2581 Secondary hyperparathyroidism of renal origin: Secondary | ICD-10-CM | POA: Diagnosis not present

## 2018-05-15 DIAGNOSIS — D631 Anemia in chronic kidney disease: Secondary | ICD-10-CM | POA: Diagnosis not present

## 2018-05-15 DIAGNOSIS — N186 End stage renal disease: Secondary | ICD-10-CM | POA: Diagnosis not present

## 2018-05-15 DIAGNOSIS — E876 Hypokalemia: Secondary | ICD-10-CM | POA: Diagnosis not present

## 2018-05-15 DIAGNOSIS — D509 Iron deficiency anemia, unspecified: Secondary | ICD-10-CM | POA: Diagnosis not present

## 2018-05-15 DIAGNOSIS — D689 Coagulation defect, unspecified: Secondary | ICD-10-CM | POA: Diagnosis not present

## 2018-05-15 DIAGNOSIS — E1129 Type 2 diabetes mellitus with other diabetic kidney complication: Secondary | ICD-10-CM | POA: Diagnosis not present

## 2018-05-17 DIAGNOSIS — M329 Systemic lupus erythematosus, unspecified: Secondary | ICD-10-CM | POA: Diagnosis not present

## 2018-05-17 DIAGNOSIS — N186 End stage renal disease: Secondary | ICD-10-CM | POA: Diagnosis not present

## 2018-05-17 DIAGNOSIS — I4891 Unspecified atrial fibrillation: Secondary | ICD-10-CM | POA: Diagnosis not present

## 2018-05-17 DIAGNOSIS — E1122 Type 2 diabetes mellitus with diabetic chronic kidney disease: Secondary | ICD-10-CM | POA: Diagnosis not present

## 2018-05-17 DIAGNOSIS — J449 Chronic obstructive pulmonary disease, unspecified: Secondary | ICD-10-CM | POA: Diagnosis not present

## 2018-05-17 DIAGNOSIS — I872 Venous insufficiency (chronic) (peripheral): Secondary | ICD-10-CM | POA: Diagnosis not present

## 2018-05-17 DIAGNOSIS — Z87891 Personal history of nicotine dependence: Secondary | ICD-10-CM | POA: Diagnosis not present

## 2018-05-17 DIAGNOSIS — M150 Primary generalized (osteo)arthritis: Secondary | ICD-10-CM | POA: Diagnosis not present

## 2018-05-17 DIAGNOSIS — M109 Gout, unspecified: Secondary | ICD-10-CM | POA: Diagnosis not present

## 2018-05-17 DIAGNOSIS — Z794 Long term (current) use of insulin: Secondary | ICD-10-CM | POA: Diagnosis not present

## 2018-05-17 DIAGNOSIS — H409 Unspecified glaucoma: Secondary | ICD-10-CM | POA: Diagnosis not present

## 2018-05-17 DIAGNOSIS — Z992 Dependence on renal dialysis: Secondary | ICD-10-CM | POA: Diagnosis not present

## 2018-05-17 DIAGNOSIS — E785 Hyperlipidemia, unspecified: Secondary | ICD-10-CM | POA: Diagnosis not present

## 2018-05-18 DIAGNOSIS — D631 Anemia in chronic kidney disease: Secondary | ICD-10-CM | POA: Diagnosis not present

## 2018-05-18 DIAGNOSIS — D509 Iron deficiency anemia, unspecified: Secondary | ICD-10-CM | POA: Diagnosis not present

## 2018-05-18 DIAGNOSIS — E876 Hypokalemia: Secondary | ICD-10-CM | POA: Diagnosis not present

## 2018-05-18 DIAGNOSIS — N186 End stage renal disease: Secondary | ICD-10-CM | POA: Diagnosis not present

## 2018-05-18 DIAGNOSIS — N2581 Secondary hyperparathyroidism of renal origin: Secondary | ICD-10-CM | POA: Diagnosis not present

## 2018-05-18 DIAGNOSIS — E1129 Type 2 diabetes mellitus with other diabetic kidney complication: Secondary | ICD-10-CM | POA: Diagnosis not present

## 2018-05-18 DIAGNOSIS — D689 Coagulation defect, unspecified: Secondary | ICD-10-CM | POA: Diagnosis not present

## 2018-05-19 ENCOUNTER — Encounter: Payer: Medicare Other | Attending: Internal Medicine | Admitting: Internal Medicine

## 2018-05-19 DIAGNOSIS — Z794 Long term (current) use of insulin: Secondary | ICD-10-CM | POA: Diagnosis not present

## 2018-05-19 DIAGNOSIS — E1122 Type 2 diabetes mellitus with diabetic chronic kidney disease: Secondary | ICD-10-CM | POA: Diagnosis not present

## 2018-05-19 DIAGNOSIS — I132 Hypertensive heart and chronic kidney disease with heart failure and with stage 5 chronic kidney disease, or end stage renal disease: Secondary | ICD-10-CM | POA: Diagnosis not present

## 2018-05-19 DIAGNOSIS — M329 Systemic lupus erythematosus, unspecified: Secondary | ICD-10-CM | POA: Insufficient documentation

## 2018-05-19 DIAGNOSIS — L97213 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scle: Secondary | ICD-10-CM | POA: Diagnosis not present

## 2018-05-19 DIAGNOSIS — L97523 Non-pressure chronic ulcer of other part of left foot with necrosis of muscle: Secondary | ICD-10-CM | POA: Diagnosis not present

## 2018-05-19 DIAGNOSIS — M793 Panniculitis, unspecified: Secondary | ICD-10-CM | POA: Insufficient documentation

## 2018-05-19 DIAGNOSIS — F329 Major depressive disorder, single episode, unspecified: Secondary | ICD-10-CM | POA: Insufficient documentation

## 2018-05-19 DIAGNOSIS — L980 Pyogenic granuloma: Secondary | ICD-10-CM | POA: Diagnosis not present

## 2018-05-19 DIAGNOSIS — Z992 Dependence on renal dialysis: Secondary | ICD-10-CM | POA: Insufficient documentation

## 2018-05-19 DIAGNOSIS — E11622 Type 2 diabetes mellitus with other skin ulcer: Secondary | ICD-10-CM | POA: Diagnosis not present

## 2018-05-19 DIAGNOSIS — L89610 Pressure ulcer of right heel, unstageable: Secondary | ICD-10-CM | POA: Insufficient documentation

## 2018-05-19 DIAGNOSIS — S91301A Unspecified open wound, right foot, initial encounter: Secondary | ICD-10-CM | POA: Diagnosis not present

## 2018-05-19 DIAGNOSIS — N186 End stage renal disease: Secondary | ICD-10-CM | POA: Insufficient documentation

## 2018-05-19 DIAGNOSIS — S91302A Unspecified open wound, left foot, initial encounter: Secondary | ICD-10-CM | POA: Diagnosis not present

## 2018-05-19 DIAGNOSIS — S81801A Unspecified open wound, right lower leg, initial encounter: Secondary | ICD-10-CM | POA: Diagnosis not present

## 2018-05-19 NOTE — Progress Notes (Addendum)
[facility] Note  05/21/2018     CHIEF COMPLAINT Patient presents for Retina Follow Up   HISTORY OF PRESENT ILLNESS: [name] is a 72 y.o. female who presents to the clinic today for:   HPI    Retina Follow Up    Patient presents with  Diabetic Retinopathy.  In right eye.  This started 2 weeks ago.  Severity is mild.  Since onset it is stable.  I, the attending physician,  performed the HPI with the patient and updated documentation appropriately.          Comments    F/U NPDR OU.S/P laser retinopexy OD 05/07/18. Patient states she has less floaters'" I have them off and on". BS patient does not recall, husband thinks it was in the 200's. Denies visual onsets issues.Patient states she is using gtt's , she does not recall names. Pt reports she is to have amputation rt leg in near future.        Last edited by Bernarda Caffey, MD on 05/21/2018  1:32 PM. (History)      Referring physician: Sharilyn Sites, MD [address] [place],  [address]  HISTORICAL INFORMATION:   Selected notes from the MEDICAL RECORD NUMBER Referred by Dr. Kathlen Mody for concern of BDR LEE: 04.26.19 Read Drivers) [BCVA: OD: 20/30 OS: 20/50+2 Ocular Hx-Glaucoma OU, Pseudophakia OU, s/p YAP PC OS, floaters OU, diabetic retinopathy, HTN Ret, hx of iritis PMH- DM, HTN, lupus, arthritis, asthma  Previous exams by Silvestre Gunner and Blanchie Serve at [place]: Current Outpatient Medications (Ophthalmic Drugs)  Medication Sig  . brimonidine-timolol (COMBIGAN) 0.2-0.5 % ophthalmic solution Place 1 drop into the left eye daily.   . prednisoLONE acetate (PRED FORTE) 1 % ophthalmic suspension Place 1 drop into the left eye 4 (four) times daily for 7 days.  . travoprost, benzalkonium, (TRAVATAN) 0.004 % ophthalmic solution Place 1 drop into the left eye at bedtime.   No current facility-administered medications for this visit.  (Ophthalmic Drugs)   Current  Outpatient Medications (Other)  Medication Sig  . acetaminophen (TYLENOL) 500 MG tablet Take 1,000 mg by mouth every 6 (six) hours as needed. For pain  . albuterol (ACCUNEB) 0.63 MG/3ML nebulizer solution Take 1 ampule by nebulization every 6 (six) hours as needed for wheezing or shortness of breath.   Marland Kitchen albuterol (PROVENTIL HFA;VENTOLIN HFA) 108 (90 BASE) MCG/ACT inhaler Inhale 2 puffs into the lungs every 6 (six) hours as needed. For shortness of breath  . Amino Acids-Protein Hydrolys (FEEDING SUPPLEMENT, PRO-STAT SUGAR FREE 64,) LIQD Take 30 mLs by mouth 2 (two) times daily. (Patient taking differently: Take 30 mLs by mouth 3 (three) times daily with meals. )  . apixaban (ELIQUIS) 5 MG TABS tablet Take 1 tablet (5 mg total) by mouth 2 (two) times daily.  Marland Kitchen atorvastatin (LIPITOR) 20 MG tablet Take 20 mg by mouth at bedtime.   . calcium acetate (PHOSLO) 667 MG capsule Take 667 mg by mouth 3 (three) times daily with meals.  . dicyclomine (BENTYL) 10 MG capsule Take 10 mg by mouth 4 (four) times daily -  before meals and at bedtime.  . febuxostat (ULORIC) 40 MG tablet Take 40 mg by mouth daily.  . folic acid (FOLVITE) 1 MG tablet Take 1 mg by mouth daily.  . hydroxychloroquine (PLAQUENIL) 200 MG tablet Take 300 mg by mouth as directed. Take 1 1/2 tablets daily  . Insulin Detemir (LEVEMIR FLEXTOUCH) 100  UNIT/ML Pen Inject 10 Units into the skin every morning.  . insulin lispro (HUMALOG KWIKPEN) 100 UNIT/ML KiwkPen Inject 5 Units into the skin 3 (three) times daily with meals as needed (*Only take if blood sugar levels are over 150).  . metoprolol tartrate (LOPRESSOR) 50 MG tablet Take 50 mg by mouth See admin instructions. Take 1 tablet twice a day, except on Tuesday, Thursday, Saturday, only take 1 tablet daily in the evening  . Multiple Vitamins-Minerals (CENTRUM SILVER PO) Take 1 tablet by mouth daily.  Marland Kitchen oxyCODONE (ROXICODONE) 5 MG immediate release tablet Take 1 tablet (5 mg total) by mouth every  4 (four) hours as needed for severe pain.  Marland Kitchen oxyCODONE-acetaminophen (PERCOCET/ROXICET) 5-325 MG tablet Take 1 tablet by mouth every 6 (six) hours as needed for severe pain.  . pantoprazole (PROTONIX) 40 MG tablet Take 1 tablet (40 mg total) by mouth daily.  . predniSONE (DELTASONE) 5 MG tablet Take 5 mg by mouth daily with breakfast.  . silver sulfADIAZINE (SILVADENE) 1 % cream Apply 1 application topically daily.   No current facility-administered medications for this visit.  (Other)      REVIEW OF SYSTEMS: ROS    Positive for: Endocrine, Eyes   Negative for: Constitutional, Gastrointestinal, Neurological, Skin, Genitourinary, Musculoskeletal, HENT, Cardiovascular, Respiratory, Psychiatric, Allergic/Imm, Heme/Lymph   Last edited by Zenovia Jordan, LPN on 10/31/3341  5:68 PM. (History)       ALLERGIES Allergies  Allergen Reactions  . Ace Inhibitors Cough    PAST MEDICAL HISTORY Past Medical History:  Diagnosis Date  . Anemia   . Arthritis   . Asthma   . Atrial fibrillation ([place])   . Atrial flutter [facility])    TEE/DCCV December 2013 - on Xarelto [facility])  . Bone spur of toe    Right 5th toe  . Chronic diastolic CHF (congestive heart failure) (HCC)    a. EF 50-55% by echo in 2015  . CKD (chronic kidney disease) stage 4, GFR 15-29 ml/min (HCC)    a. s/p fistula placement but not on HD. Followed by Nephrology.   . Colon polyposis   . COPD (chronic obstructive pulmonary disease) ([place])   . DVT of upper extremity (deep vein thrombosis) ([place])    Right basilic vein, June 6168  . Essential hypertension, benign   . Fractures   . Glaucoma   . SLE (systemic lupus erythematosus) ([place])   . Type 2 diabetes mellitus ([place])   . UTI (lower urinary tract infection)   . Wound of right leg 06/2017   Past Surgical History:  Procedure Laterality Date  . ABDOMINAL AORTOGRAM W/LOWER EXTREMITY N/A 12/18/2017   Procedure: ABDOMINAL AORTOGRAM W/LOWER EXTREMITY;  Surgeon: Elam Dutch, MD;   Location: [place] [facility];  Service: Cardiovascular;  Laterality: N/A;  bilateral  . ABDOMINAL HYSTERECTOMY  1983  . ANKLE SURGERY  1993  . AV FISTULA PLACEMENT Left 03/27/2014   Procedure: ARTERIOVENOUS (AV) FISTULA CREATION;  Surgeon: Rosetta Posner, MD;  Location: [facility];  Service: Vascular;  Laterality: Left;  . BACK SURGERY  1980  . BASCILIC VEIN TRANSPOSITION Left 01/13/2018   Procedure: LEFT BASILIC VEIN TRANSPOSITION SECOND STAGE;  Surgeon: Elam Dutch, MD;  Location: [facility];  Service: Vascular;  Laterality: Left;  . CARDIOVASCULAR STRESS TEST  12/19/2009   no stress induced rhythm abnormalities, ekg negative for ischemia  . CARDIOVERSION  09/16/2012   Procedure: CARDIOVERSION;  Surgeon: Sanda Klein, MD;  Location: [place];  Service: Cardiovascular;  Laterality: N/A;  . CATARACT EXTRACTION    . COLONOSCOPY  09/20/2012   Dr. [name]: multiple tubular adenomas   . COLONOSCOPY  2005   Dr. [name]. Polyps, path unknown   . COLONOSCOPY N/A 07/24/2016   Procedure: COLONOSCOPY;  Surgeon: [name], MD;  [facility];  Service: Endoscopy;  Laterality: N/A;  9:00 am  . ESOPHAGOGASTRODUODENOSCOPY  09/20/2012   Dr. [name]: duodenal erosion and possible resolving ulcer at the angularis   . ESOPHAGOGASTRODUODENOSCOPY N/A 03/24/2018   Procedure: ESOPHAGOGASTRODUODENOSCOPY (EGD);  Surgeon: [name], MD;  [facility];  Service: Endoscopy;  Laterality: N/A;  12:45pm- moved up to arrive at 8:30am per Tretha Sciara  . EYE SURGERY    . IR FLUORO GUIDE CV LINE RIGHT  11/09/2017  . IR US GUIDE VASC ACCESS RIGHT  11/09/2017  . REVISON OF ARTERIOVENOUS FISTULA Left 03/15/2018   Procedure: REVISION OF Left arm BASILIC VEIN TRANSPOSITION;  Surgeon: Angelia Mould, MD;  Location: [facility];  Service: Vascular;  Laterality: Left;  . TEE WITHOUT CARDIOVERSION  09/16/2012   Procedure: TRANSESOPHAGEAL ECHOCARDIOGRAM (TEE);  Surgeon: Sanda Klein, MD;  [facility]  ENDOSCOPY;  Service: Cardiovascular;  Laterality: N/A;  . TRANSESOPHAGEAL ECHOCARDIOGRAM WITH CARDIOVERSION  09/16/2012   EF 60-65%, moderate LVH, moderate regurg of the aortic valve, LA moderately dilated    FAMILY HISTORY Family History  Problem Relation Age of Onset  . Diabetes Brother   . Diabetes Father   . Hypertension Father   . Hypertension Sister   . Stroke Mother   . Diabetes Sister   . Hypertension Brother   . Colon cancer Neg Hx     SOCIAL HISTORY Social History   Tobacco Use  . Smoking status: Former Smoker    Packs/day: 1.00    Years: 30.00    Pack years: 30.00    Types: Cigarettes    Last attempt to quit: 08/29/2012    Years since quitting: 5.7  . Smokeless tobacco: Never Used  . Tobacco comment: quit 08-2012  Substance Use Topics  . Alcohol use: No    Alcohol/week: 0.0 standard drinks  . Drug use: No         OPHTHALMIC EXAM:  Base Eye Exam    Visual Acuity (Snellen - Linear)      Right Left   Dist [place] 20/70 -2 20/100 -1   Dist ph [place] NI NI   Correction:  Glasses       Tonometry (Tonopen, 1:13 PM)      Right Left   Pressure 17 18       Pupils      Dark Light Shape React APD   Right 4 3 Round Brisk None   Left 4 3 Round Brisk None       Visual Fields (Counting fingers)      Left Right    Full Full       Extraocular Movement      Right Left    Full, Ortho Full, Ortho       Neuro/Psych    Oriented x3:  Yes   Mood/Affect:  Normal       Dilation    Both eyes:  1.0% Mydriacyl, 2.5% Phenylephrine @ 1:14 PM        Slit Lamp and Fundus Exam    Slit Lamp Exam      Right Left   Lids/Lashes Dermatochalasis - upper lid, mild Meibomian gland dysfunction Dermatochalasis - upper lid, mild  Meibomian gland dysfunction   Conjunctiva/Sclera White and quiet White and quiet   Cornea Arcus Arcus   Anterior Chamber Deep and quiet Deep and quiet   Iris Round and dilated, No NVI Round and dilated, No NVI   Lens PC IOL in good position three  piece sulcus IOL with optic capture, open PC   Vitreous Vitreous syneresis empty - ?post vitrectomy       Fundus Exam      Right Left   Disc Pink and Sharp, no NVD Pink and Sharp, mild Pallor   C/D Ratio 0.2 0.25   Macula Blunted foveal reflex, Retinal pigment epithelial mottling, focal laser scars temporal to fovea, scattered Microaneurysms Good foveal reflex, Retinal pigment epithelial mottling, focal laser scars, rare Microaneurysms, mild Epiretinal membrane   Vessels Vascular attenuation, Tortuous Vascular attenuation, Tortuous, AV crossing changes   Periphery Attached, scattered DBH; early laser changes -- 360 PRP Attached, scattered DBH          IMAGING AND PROCEDURES  Imaging and Procedures for 02/01/18  Panretinal Photocoagulation - OS - Left Eye       LASER PROCEDURE NOTE  Diagnosis:   Severe Non-Proliferative Diabetic Retinopathy, LEFT EYE  Procedure:  Pan-retinal photocoagulation using slit lamp laser, LEFT EYE  Anesthesia:  Topical  Surgeon: Bernarda Caffey, MD, PhD   Informed consent obtained, operative eye marked, and time out performed prior to initiation of laser.   Lumenis [id number] slit lamp laser Pattern: 3x3 square Power: 300 mW Duration: 30 msec  Spot size: 200 microns  # spots: 381 spots  Complications: None.  Notes: pt with more discomfort this time  RTC: 4-6 wks  Patient tolerated the procedure well and received written and verbal post-procedure care information/education.                  ASSESSMENT/PLAN:    ICD-10-CM   1. Proliferative diabetic retinopathy of right eye without macular edema associated with type 2 diabetes mellitus (HCC) W29.9371 CANCELED: OCT, Retina - OU - Both Eyes    CANCELED: OCT, Retina - OU - Both Eyes  2. Severe nonproliferative diabetic retinopathy of left eye without macular edema associated with type 2 diabetes mellitus (HCC) I96.7893 Panretinal Photocoagulation - OS - Left Eye    CANCELED: OCT,  Retina - OU - Both Eyes  3. Epiretinal membrane (ERM) of left eye H35.372   4. Pseudophakia of both eyes Z96.1     1. PDR w/o macular edema OD 2. Severe NPDR w/o macular edema OS - former pt of Drs. Cordelia Pen and Kurup at [facility] - s/p focal laser and intravitreal injections - exam shows scattered [place] OU -- no NV - OCT without diabetic macular edema OD, mild noncentral edema OS - FA today shows leaking NV OD, scattered peripheral capillary nonperfusion OS - S/P PRP OD (08.09.19) - recommend PRP OS today (08.23.19) - pt wishes to proceed - RBA of procedure discussed, questions answered - informed consent obtained and signed - see procedure note - f/u in 4-6 weeks, DFE, OCT  3. Epiretinal membrane, left eye  The natural history, anatomy, potential for loss of vision, and treatment options including vitrectomy techniques and the complications of endophthalmitis, retinal detachment, vitreous hemorrhage, cataract progression and permanent vision loss discussed with the patient. - mild ERM - not visually significant - no surgical intervention indicated at this time - monitor  4. Pseudophakia OU  - s/p CE/IOL OU [facility])  - history of complex  cataract surgery OS with retained lens fragment  - ?history of PPV/PPL OS w/ Kurup -- will try to obtain records from [facility]  - doing well -- PCIOLs in good position and stable  - monitor    Ophthalmic Meds Ordered this visit:  Meds ordered this encounter  Medications  . prednisoLONE acetate (PRED FORTE) 1 % ophthalmic suspension    Sig: Place 1 drop into the left eye 4 (four) times daily for 7 days.    Dispense:  10 mL    Refill:  0       Return for 4-6 wks, , Dilated Exam, OCT.  There are no Patient Instructions on file for this visit.   Explained the diagnoses, plan, and follow up with the patient and they expressed understanding.  Patient expressed understanding of the importance of proper  follow up care.   This document serves as a record of services personally performed by Gardiner Sleeper, MD, PhD. It was created on their behalf by Ernest Mallick, OA, an ophthalmic assistant. The creation of this record is the provider's dictation and/or activities during the visit.    Electronically signed by: Ernest Mallick, OA  08.21.2019 11:41 PM    Gardiner Sleeper, M.D., Ph.D. Diseases & Surgery of the Retina and Vitreous [facility]   I have reviewed the above documentation for accuracy and completeness, and I agree with the above. Gardiner Sleeper, M.D., Ph.D. 05/22/18 11:41 PM     Abbreviations: M myopia (nearsighted); A astigmatism; H hyperopia (farsighted); P presbyopia; Mrx spectacle prescription;  CTL contact lenses; OD right eye; OS left eye; OU both eyes  XT exotropia; ET esotropia; PEK punctate epithelial keratitis; PEE punctate epithelial erosions; DES dry eye syndrome; MGD meibomian gland dysfunction; ATs artificial tears; PFAT's preservative free artificial tears; [place] nuclear sclerotic cataract; PSC posterior subcapsular cataract; ERM epi-retinal membrane; PVD posterior vitreous detachment; RD retinal detachment; DM diabetes mellitus; DR diabetic retinopathy; NPDR non-proliferative diabetic retinopathy; PDR proliferative diabetic retinopathy; CSME clinically significant macular edema; DME diabetic macular edema; dbh dot blot hemorrhages; CWS cotton wool spot; POAG primary open angle glaucoma; C/D cup-to-disc ratio; HVF humphrey visual field; GVF goldmann visual field; OCT optical coherence tomography; IOP intraocular pressure; BRVO Branch retinal vein occlusion; CRVO central retinal vein occlusion; CRAO central retinal artery occlusion; BRAO branch retinal artery occlusion; RT retinal tear; SB scleral buckle; PPV pars plana vitrectomy; VH Vitreous hemorrhage; PRP panretinal laser photocoagulation; IVK intravitreal kenalog; VMT vitreomacular traction; MH Macular  hole;  NVD neovascularization of the disc; NVE neovascularization elsewhere; AREDS age related eye disease study; ARMD age related macular degeneration; POAG primary open angle glaucoma; EBMD epithelial/anterior basement membrane dystrophy; ACIOL anterior chamber intraocular lens; IOL intraocular lens; PCIOL posterior chamber intraocular lens; Phaco/IOL phacoemulsification with intraocular lens placement; [place] photorefractive keratectomy; LASIK laser assisted in situ keratomileusis; HTN hypertension; DM diabetes mellitus; COPD chronic obstructive pulmonary disease

## 2018-05-20 DIAGNOSIS — D509 Iron deficiency anemia, unspecified: Secondary | ICD-10-CM | POA: Diagnosis not present

## 2018-05-20 DIAGNOSIS — D631 Anemia in chronic kidney disease: Secondary | ICD-10-CM | POA: Diagnosis not present

## 2018-05-20 DIAGNOSIS — E876 Hypokalemia: Secondary | ICD-10-CM | POA: Diagnosis not present

## 2018-05-20 DIAGNOSIS — N2581 Secondary hyperparathyroidism of renal origin: Secondary | ICD-10-CM | POA: Diagnosis not present

## 2018-05-20 DIAGNOSIS — D689 Coagulation defect, unspecified: Secondary | ICD-10-CM | POA: Diagnosis not present

## 2018-05-20 DIAGNOSIS — N186 End stage renal disease: Secondary | ICD-10-CM | POA: Diagnosis not present

## 2018-05-20 DIAGNOSIS — E1129 Type 2 diabetes mellitus with other diabetic kidney complication: Secondary | ICD-10-CM | POA: Diagnosis not present

## 2018-05-21 ENCOUNTER — Ambulatory Visit (INDEPENDENT_AMBULATORY_CARE_PROVIDER_SITE_OTHER): Payer: Medicare Other | Admitting: Ophthalmology

## 2018-05-21 ENCOUNTER — Encounter (INDEPENDENT_AMBULATORY_CARE_PROVIDER_SITE_OTHER): Payer: Self-pay | Admitting: Ophthalmology

## 2018-05-21 DIAGNOSIS — M109 Gout, unspecified: Secondary | ICD-10-CM | POA: Diagnosis not present

## 2018-05-21 DIAGNOSIS — E785 Hyperlipidemia, unspecified: Secondary | ICD-10-CM | POA: Diagnosis not present

## 2018-05-21 DIAGNOSIS — N186 End stage renal disease: Secondary | ICD-10-CM | POA: Diagnosis not present

## 2018-05-21 DIAGNOSIS — H409 Unspecified glaucoma: Secondary | ICD-10-CM | POA: Diagnosis not present

## 2018-05-21 DIAGNOSIS — E1122 Type 2 diabetes mellitus with diabetic chronic kidney disease: Secondary | ICD-10-CM | POA: Diagnosis not present

## 2018-05-21 DIAGNOSIS — L97521 Non-pressure chronic ulcer of other part of left foot limited to breakdown of skin: Secondary | ICD-10-CM | POA: Diagnosis not present

## 2018-05-21 DIAGNOSIS — M150 Primary generalized (osteo)arthritis: Secondary | ICD-10-CM | POA: Diagnosis not present

## 2018-05-21 DIAGNOSIS — Z992 Dependence on renal dialysis: Secondary | ICD-10-CM | POA: Diagnosis not present

## 2018-05-21 DIAGNOSIS — E113591 Type 2 diabetes mellitus with proliferative diabetic retinopathy without macular edema, right eye: Secondary | ICD-10-CM

## 2018-05-21 DIAGNOSIS — M329 Systemic lupus erythematosus, unspecified: Secondary | ICD-10-CM | POA: Diagnosis not present

## 2018-05-21 DIAGNOSIS — I4891 Unspecified atrial fibrillation: Secondary | ICD-10-CM | POA: Diagnosis not present

## 2018-05-21 DIAGNOSIS — E113492 Type 2 diabetes mellitus with severe nonproliferative diabetic retinopathy without macular edema, left eye: Secondary | ICD-10-CM | POA: Diagnosis not present

## 2018-05-21 DIAGNOSIS — Z87891 Personal history of nicotine dependence: Secondary | ICD-10-CM | POA: Diagnosis not present

## 2018-05-21 DIAGNOSIS — I872 Venous insufficiency (chronic) (peripheral): Secondary | ICD-10-CM | POA: Diagnosis not present

## 2018-05-21 DIAGNOSIS — H35372 Puckering of macula, left eye: Secondary | ICD-10-CM

## 2018-05-21 DIAGNOSIS — L97213 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scle: Secondary | ICD-10-CM | POA: Diagnosis not present

## 2018-05-21 DIAGNOSIS — Z794 Long term (current) use of insulin: Secondary | ICD-10-CM | POA: Diagnosis not present

## 2018-05-21 DIAGNOSIS — J449 Chronic obstructive pulmonary disease, unspecified: Secondary | ICD-10-CM | POA: Diagnosis not present

## 2018-05-21 DIAGNOSIS — L89610 Pressure ulcer of right heel, unstageable: Secondary | ICD-10-CM | POA: Diagnosis not present

## 2018-05-21 DIAGNOSIS — E11622 Type 2 diabetes mellitus with other skin ulcer: Secondary | ICD-10-CM | POA: Diagnosis not present

## 2018-05-21 DIAGNOSIS — Z961 Presence of intraocular lens: Secondary | ICD-10-CM

## 2018-05-21 MED ORDER — PREDNISOLONE ACETATE 1 % OP SUSP: 1.0000 [drp] | Freq: Four times a day (QID) | OPHTHALMIC | 0 refills | Status: AC

## 2018-05-22 DIAGNOSIS — D631 Anemia in chronic kidney disease: Secondary | ICD-10-CM | POA: Diagnosis not present

## 2018-05-22 DIAGNOSIS — N186 End stage renal disease: Secondary | ICD-10-CM | POA: Diagnosis not present

## 2018-05-22 DIAGNOSIS — N2581 Secondary hyperparathyroidism of renal origin: Secondary | ICD-10-CM | POA: Diagnosis not present

## 2018-05-22 DIAGNOSIS — E1129 Type 2 diabetes mellitus with other diabetic kidney complication: Secondary | ICD-10-CM | POA: Diagnosis not present

## 2018-05-22 DIAGNOSIS — D509 Iron deficiency anemia, unspecified: Secondary | ICD-10-CM | POA: Diagnosis not present

## 2018-05-22 DIAGNOSIS — E876 Hypokalemia: Secondary | ICD-10-CM | POA: Diagnosis not present

## 2018-05-22 DIAGNOSIS — D689 Coagulation defect, unspecified: Secondary | ICD-10-CM | POA: Diagnosis not present

## 2018-05-24 DIAGNOSIS — Z794 Long term (current) use of insulin: Secondary | ICD-10-CM | POA: Diagnosis not present

## 2018-05-24 DIAGNOSIS — H409 Unspecified glaucoma: Secondary | ICD-10-CM | POA: Diagnosis not present

## 2018-05-24 DIAGNOSIS — E785 Hyperlipidemia, unspecified: Secondary | ICD-10-CM | POA: Diagnosis not present

## 2018-05-24 DIAGNOSIS — I872 Venous insufficiency (chronic) (peripheral): Secondary | ICD-10-CM | POA: Diagnosis not present

## 2018-05-24 DIAGNOSIS — I4891 Unspecified atrial fibrillation: Secondary | ICD-10-CM | POA: Diagnosis not present

## 2018-05-24 DIAGNOSIS — E11622 Type 2 diabetes mellitus with other skin ulcer: Secondary | ICD-10-CM | POA: Diagnosis not present

## 2018-05-24 DIAGNOSIS — L97213 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scle: Secondary | ICD-10-CM | POA: Diagnosis not present

## 2018-05-24 DIAGNOSIS — M329 Systemic lupus erythematosus, unspecified: Secondary | ICD-10-CM | POA: Diagnosis not present

## 2018-05-24 DIAGNOSIS — E1122 Type 2 diabetes mellitus with diabetic chronic kidney disease: Secondary | ICD-10-CM | POA: Diagnosis not present

## 2018-05-24 DIAGNOSIS — Z992 Dependence on renal dialysis: Secondary | ICD-10-CM | POA: Diagnosis not present

## 2018-05-24 DIAGNOSIS — Z87891 Personal history of nicotine dependence: Secondary | ICD-10-CM | POA: Diagnosis not present

## 2018-05-24 DIAGNOSIS — L89610 Pressure ulcer of right heel, unstageable: Secondary | ICD-10-CM | POA: Diagnosis not present

## 2018-05-24 DIAGNOSIS — N186 End stage renal disease: Secondary | ICD-10-CM | POA: Diagnosis not present

## 2018-05-24 DIAGNOSIS — J449 Chronic obstructive pulmonary disease, unspecified: Secondary | ICD-10-CM | POA: Diagnosis not present

## 2018-05-24 DIAGNOSIS — L97521 Non-pressure chronic ulcer of other part of left foot limited to breakdown of skin: Secondary | ICD-10-CM | POA: Diagnosis not present

## 2018-05-24 DIAGNOSIS — M150 Primary generalized (osteo)arthritis: Secondary | ICD-10-CM | POA: Diagnosis not present

## 2018-05-24 DIAGNOSIS — M109 Gout, unspecified: Secondary | ICD-10-CM | POA: Diagnosis not present

## 2018-05-25 DIAGNOSIS — E1129 Type 2 diabetes mellitus with other diabetic kidney complication: Secondary | ICD-10-CM | POA: Diagnosis not present

## 2018-05-25 DIAGNOSIS — D509 Iron deficiency anemia, unspecified: Secondary | ICD-10-CM | POA: Diagnosis not present

## 2018-05-25 DIAGNOSIS — D631 Anemia in chronic kidney disease: Secondary | ICD-10-CM | POA: Diagnosis not present

## 2018-05-25 DIAGNOSIS — E876 Hypokalemia: Secondary | ICD-10-CM | POA: Diagnosis not present

## 2018-05-25 DIAGNOSIS — D689 Coagulation defect, unspecified: Secondary | ICD-10-CM | POA: Diagnosis not present

## 2018-05-25 DIAGNOSIS — N186 End stage renal disease: Secondary | ICD-10-CM | POA: Diagnosis not present

## 2018-05-25 DIAGNOSIS — N2581 Secondary hyperparathyroidism of renal origin: Secondary | ICD-10-CM | POA: Diagnosis not present

## 2018-05-26 ENCOUNTER — Encounter: Payer: Medicare Other | Admitting: Internal Medicine

## 2018-05-26 DIAGNOSIS — L89610 Pressure ulcer of right heel, unstageable: Secondary | ICD-10-CM | POA: Diagnosis not present

## 2018-05-26 DIAGNOSIS — S91302A Unspecified open wound, left foot, initial encounter: Secondary | ICD-10-CM | POA: Diagnosis not present

## 2018-05-26 DIAGNOSIS — E1122 Type 2 diabetes mellitus with diabetic chronic kidney disease: Secondary | ICD-10-CM | POA: Diagnosis not present

## 2018-05-26 DIAGNOSIS — Z992 Dependence on renal dialysis: Secondary | ICD-10-CM | POA: Diagnosis not present

## 2018-05-26 DIAGNOSIS — M793 Panniculitis, unspecified: Secondary | ICD-10-CM | POA: Diagnosis not present

## 2018-05-26 DIAGNOSIS — S81801A Unspecified open wound, right lower leg, initial encounter: Secondary | ICD-10-CM | POA: Diagnosis not present

## 2018-05-26 DIAGNOSIS — L97523 Non-pressure chronic ulcer of other part of left foot with necrosis of muscle: Secondary | ICD-10-CM | POA: Diagnosis not present

## 2018-05-26 DIAGNOSIS — Z794 Long term (current) use of insulin: Secondary | ICD-10-CM | POA: Diagnosis not present

## 2018-05-26 DIAGNOSIS — L97213 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scle: Secondary | ICD-10-CM | POA: Diagnosis not present

## 2018-05-26 DIAGNOSIS — N186 End stage renal disease: Secondary | ICD-10-CM | POA: Diagnosis not present

## 2018-05-26 DIAGNOSIS — I132 Hypertensive heart and chronic kidney disease with heart failure and with stage 5 chronic kidney disease, or end stage renal disease: Secondary | ICD-10-CM | POA: Diagnosis not present

## 2018-05-26 DIAGNOSIS — G894 Chronic pain syndrome: Secondary | ICD-10-CM | POA: Diagnosis not present

## 2018-05-26 DIAGNOSIS — S91301A Unspecified open wound, right foot, initial encounter: Secondary | ICD-10-CM | POA: Diagnosis not present

## 2018-05-26 DIAGNOSIS — E11622 Type 2 diabetes mellitus with other skin ulcer: Secondary | ICD-10-CM | POA: Diagnosis not present

## 2018-05-26 DIAGNOSIS — L980 Pyogenic granuloma: Secondary | ICD-10-CM | POA: Diagnosis not present

## 2018-05-26 DIAGNOSIS — M329 Systemic lupus erythematosus, unspecified: Secondary | ICD-10-CM | POA: Diagnosis not present

## 2018-05-27 DIAGNOSIS — E1129 Type 2 diabetes mellitus with other diabetic kidney complication: Secondary | ICD-10-CM | POA: Diagnosis not present

## 2018-05-27 DIAGNOSIS — E876 Hypokalemia: Secondary | ICD-10-CM | POA: Diagnosis not present

## 2018-05-27 DIAGNOSIS — N2581 Secondary hyperparathyroidism of renal origin: Secondary | ICD-10-CM | POA: Diagnosis not present

## 2018-05-27 DIAGNOSIS — D509 Iron deficiency anemia, unspecified: Secondary | ICD-10-CM | POA: Diagnosis not present

## 2018-05-27 DIAGNOSIS — D689 Coagulation defect, unspecified: Secondary | ICD-10-CM | POA: Diagnosis not present

## 2018-05-27 DIAGNOSIS — D631 Anemia in chronic kidney disease: Secondary | ICD-10-CM | POA: Diagnosis not present

## 2018-05-27 DIAGNOSIS — N186 End stage renal disease: Secondary | ICD-10-CM | POA: Diagnosis not present

## 2018-05-28 DIAGNOSIS — Z794 Long term (current) use of insulin: Secondary | ICD-10-CM | POA: Diagnosis not present

## 2018-05-28 DIAGNOSIS — Z992 Dependence on renal dialysis: Secondary | ICD-10-CM | POA: Diagnosis not present

## 2018-05-28 DIAGNOSIS — L89610 Pressure ulcer of right heel, unstageable: Secondary | ICD-10-CM | POA: Diagnosis not present

## 2018-05-28 DIAGNOSIS — H409 Unspecified glaucoma: Secondary | ICD-10-CM | POA: Diagnosis not present

## 2018-05-28 DIAGNOSIS — L97521 Non-pressure chronic ulcer of other part of left foot limited to breakdown of skin: Secondary | ICD-10-CM | POA: Diagnosis not present

## 2018-05-28 DIAGNOSIS — M329 Systemic lupus erythematosus, unspecified: Secondary | ICD-10-CM | POA: Diagnosis not present

## 2018-05-28 DIAGNOSIS — M150 Primary generalized (osteo)arthritis: Secondary | ICD-10-CM | POA: Diagnosis not present

## 2018-05-28 DIAGNOSIS — E785 Hyperlipidemia, unspecified: Secondary | ICD-10-CM | POA: Diagnosis not present

## 2018-05-28 DIAGNOSIS — I872 Venous insufficiency (chronic) (peripheral): Secondary | ICD-10-CM | POA: Diagnosis not present

## 2018-05-28 DIAGNOSIS — N186 End stage renal disease: Secondary | ICD-10-CM | POA: Diagnosis not present

## 2018-05-28 DIAGNOSIS — M109 Gout, unspecified: Secondary | ICD-10-CM | POA: Diagnosis not present

## 2018-05-28 DIAGNOSIS — J449 Chronic obstructive pulmonary disease, unspecified: Secondary | ICD-10-CM | POA: Diagnosis not present

## 2018-05-28 DIAGNOSIS — L97213 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scle: Secondary | ICD-10-CM | POA: Diagnosis not present

## 2018-05-28 DIAGNOSIS — E11622 Type 2 diabetes mellitus with other skin ulcer: Secondary | ICD-10-CM | POA: Diagnosis not present

## 2018-05-28 DIAGNOSIS — Z87891 Personal history of nicotine dependence: Secondary | ICD-10-CM | POA: Diagnosis not present

## 2018-05-28 DIAGNOSIS — I4891 Unspecified atrial fibrillation: Secondary | ICD-10-CM | POA: Diagnosis not present

## 2018-05-28 DIAGNOSIS — E1122 Type 2 diabetes mellitus with diabetic chronic kidney disease: Secondary | ICD-10-CM | POA: Diagnosis not present

## 2018-05-28 NOTE — Progress Notes (Signed)
MARELY, APGAR ([id number]) Visit Report for 05/19/2018 Arrival Information Details Patient Name: [name], [name]. Date of Service: 05/19/2018 1:30 PM Medical Record Number: [id number] Patient Account Number: [id number] Date of Birth/Sex: 1946-09-12 (72 y.o. F) Treating RN: Secundino Ginger Primary Care Trevian Hayashida: Sharilyn Sites Other Clinician: Referring Rowdy Guerrini: Sharilyn Sites Treating Reather Steller/Extender: Tito Dine in Treatment: 10 Visit Information History Since Last Visit All ordered tests and consults were completed: No Patient Arrived: Wheel Chair Added or deleted any medications: Yes Arrival Time: 13:29 Any new allergies or adverse reactions: No Accompanied By: husband Had a fall or experienced change in No Transfer Assistance: EasyPivot Patient activities of daily living that may affect Lift risk of falls: Patient Identification Verified: Yes Signs or symptoms of abuse/neglect since last visito No Secondary Verification Process Yes Hospitalized since last visit: No Completed: Implantable device outside of the clinic excluding No Patient Requires Transmission-Based No cellular tissue based products placed in the center Precautions: since last visit: Patient Has Alerts: Yes Has Dressing in Place as Prescribed: Yes Patient Alerts: Patient on Blood Has Compression in Place as Prescribed: Yes Thinner Eliquis Pain Present Now: Yes DM II Electronic Signature(s) Signed: 05/19/2018 3:41:35 PM By: Secundino Ginger Entered By: Secundino Ginger on 05/19/2018 13:31:33 [name] ([id number]) -------------------------------------------------------------------------------- Clinic Level of Care Assessment Details Patient Name: [name]. Date of Service: 05/19/2018 1:30 PM Medical Record Number: [id number] Patient Account Number: [id number] Date of Birth/Sex: [birth year] (72 y.o. F) Treating RN: Montey Hora Primary Care Franko Hilliker: Sharilyn Sites Other  Clinician: Referring Kenadie Royce: Sharilyn Sites Treating Dylann Layne/Extender: Tito Dine in Treatment: 10 Clinic Level of Care Assessment Items TOOL 4 Quantity Score []  - Use when only an EandM is performed on FOLLOW-UP visit 0 ASSESSMENTS - Nursing Assessment / Reassessment X - Reassessment of Co-morbidities (includes updates in patient status) 1 10 X- 1 5 Reassessment of Adherence to Treatment Plan ASSESSMENTS - Wound and Skin Assessment / Reassessment []  - Simple Wound Assessment / Reassessment - one wound 0 X- 5 5 Complex Wound Assessment / Reassessment - multiple wounds []  - 0 Dermatologic / Skin Assessment (not related to wound area) ASSESSMENTS - Focused Assessment X - Circumferential Edema Measurements - multi extremities 1 5 []  - 0 Nutritional Assessment / Counseling / Intervention X- 1 5 Lower Extremity Assessment (monofilament, tuning fork, pulses) []  - 0 Peripheral Arterial Disease Assessment (using hand held doppler) ASSESSMENTS - Ostomy and/or Continence Assessment and Care []  - Incontinence Assessment and Management 0 []  - 0 Ostomy Care Assessment and Management (repouching, etc.) PROCESS - Coordination of Care X - Simple Patient / Family Education for ongoing care 1 15 []  - 0 Complex (extensive) Patient / Family Education for ongoing care []  - 0 Staff obtains Programmer, systems, Records, Test Results / Process Orders []  - 0 Staff telephones HHA, Nursing Homes / Clarify orders / etc []  - 0 Routine Transfer to another Facility (non-emergent condition) []  - 0 Routine Hospital Admission (non-emergent condition) []  - 0 New Admissions / Biomedical engineer / Ordering NPWT, Apligraf, etc. []  - 0 Emergency Hospital Admission (emergent condition) X- 1 10 Simple Discharge Coordination RENESSA, WELLNITZ F. ([id number]) []  - 0 Complex (extensive) Discharge Coordination PROCESS - Special Needs []  - Pediatric / Minor Patient Management 0 []  - 0 Isolation  Patient Management []  - 0 Hearing / Language / Visual special needs []  - 0 Assessment of Community assistance (transportation, D/C planning, etc.) []  - 0 Additional assistance / Altered mentation []  - 0 Support  Surface(s) Assessment (bed, cushion, seat, etc.) INTERVENTIONS - Wound Cleansing / Measurement []  - Simple Wound Cleansing - one wound 0 X- 5 5 Complex Wound Cleansing - multiple wounds X- 1 5 Wound Imaging (photographs - any number of wounds) []  - 0 Wound Tracing (instead of photographs) []  - 0 Simple Wound Measurement - one wound X- 5 5 Complex Wound Measurement - multiple wounds INTERVENTIONS - Wound Dressings []  - Small Wound Dressing one or multiple wounds 0 X- 1 15 Medium Wound Dressing one or multiple wounds X- 1 20 Large Wound Dressing one or multiple wounds []  - 0 Application of Medications - topical []  - 0 Application of Medications - injection INTERVENTIONS - Miscellaneous []  - External ear exam 0 []  - 0 Specimen Collection (cultures, biopsies, blood, body fluids, etc.) []  - 0 Specimen(s) / Culture(s) sent or taken to Lab for analysis []  - 0 Patient Transfer (multiple staff / Civil Service fast streamer / Similar devices) []  - 0 Simple Staple / Suture removal (25 or less) []  - 0 Complex Staple / Suture removal (26 or more) []  - 0 Hypo / Hyperglycemic Management (close monitor of Blood Glucose) []  - 0 Ankle / Brachial Index (ABI) - do not check if billed separately X- 1 5 Vital Signs Janyah, Singleterry Kazi F. ([id number]) Has the patient been seen at the hospital within the last three years: Yes Total Score: 170 Level Of Care: New/Established - Level 5 Electronic Signature(s) Signed: 05/19/2018 5:18:29 PM By: Montey Hora Entered By: Montey Hora on 05/19/2018 14:29:45 [name] ([id number]) -------------------------------------------------------------------------------- Encounter Discharge Information Details Patient Name: [name]. Date of Service: 05/19/2018 1:30 PM Medical Record Number: [id number] Patient Account Number: [id number] Date of Birth/Sex: Jul 10, 1946 (72 y.o. F) Treating RN: Ahmed Prima Primary Care Deshaun Schou: Sharilyn Sites Other Clinician: Referring Takeo Harts: Sharilyn Sites Treating Larsen Dungan/Extender: Tito Dine in Treatment: 10 Encounter Discharge Information Items Discharge Condition: Stable Ambulatory Status: Wheelchair Discharge Destination: Home Transportation: Private Auto Accompanied By: husband Schedule Follow-up Appointment: Yes Clinical Summary of Care: Electronic Signature(s) Signed: 05/19/2018 4:43:18 PM By: Alric Quan Entered By: Alric Quan on 05/19/2018 14:56:19 [name] ([id number]) -------------------------------------------------------------------------------- Lower Extremity Assessment Details Patient Name: [name]. Date of Service: 05/19/2018 1:30 PM Medical Record Number: [id number] Patient Account Number: [id number] Date of Birth/Sex: [birth year] (72 y.o. F) Treating RN: Secundino Ginger Primary Care Hutton Pellicane: Sharilyn Sites Other Clinician: Referring Jassmin Kemmerer: Sharilyn Sites Treating Labradford Schnitker/Extender: Tito Dine in Treatment: 10 Electronic Signature(s) Signed: 05/19/2018 3:41:35 PM By: Secundino Ginger Entered By: Secundino Ginger on 05/19/2018 13:56:03 [name] ([id number]) -------------------------------------------------------------------------------- Multi Wound Chart Details Patient Name: [name]. Date of Service: 05/19/2018 1:30 PM Medical Record Number: [id number] Patient Account Number: [id number] Date of Birth/Sex: 1946-04-06 (72 y.o. 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 Lower Extremity Lower Extremity Comorbid History: Cataracts, Anemia, Chronic Cataracts, Anemia, Chronic Cataracts, Anemia, Chronic Obstructive Pulmonary Obstructive Pulmonary Obstructive Pulmonary Disease (COPD), Arrhythmia, Disease (COPD), Arrhythmia, Disease (COPD), Arrhythmia, Hypertension, Type II Hypertension,  Type II Hypertension, Type II Diabetes, End Stage Renal Diabetes, End Stage Renal Diabetes, End Stage Renal Disease, Gout, Rheumatoid Disease, Gout, Rheumatoid Disease, Gout, Rheumatoid Arthritis Arthritis Arthritis Date Acquired: 06/29/2017 06/29/2017 06/29/2017 Weeks of Treatment: 10 10 10  Wound Status: Open Open Open Clustered Wound: No No No Clustered Quantity: N/A N/A N/A Measurements L x W x D 31.5x7.7x0.3 12.2x9.1x0.5 2x8.3x0.2 (cm) Area (cm) : 190.498 87.195 13.038 Volume (cm) : 57.149 43.597 2.608 % Reduction in Area: -449.30% 24.00% -1409.00% % Reduction in Volume: -723.80% -26.70% -2932.60% Classification: Full Thickness Without Full Thickness With Exposed Full Thickness Without Exposed Support Structures Support Structures Exposed Support Structures Exudate Amount: Medium Large Large Exudate Type: Purulent Serosanguineous Purulent Exudate Color: yellow, brown, green red, brown yellow, brown, green Foul Odor After Cleansing: No No No KEYSHIA, ORWICK F. ([id number]) Odor Anticipated Due to N/A N/A N/A Product Use: Wound Margin: Flat and Intact Distinct, outline attached Distinct, outline  attached Granulation Amount: Small (1-33%) Medium (34-66%) None Present (0%) Granulation Quality: Red, Pink Red N/A Necrotic Amount: Large (67-100%) Medium (34-66%) Large (67-100%) Necrotic Tissue: Eschar, Adherent Slough Eschar, Adherent Slough Eschar Exposed Structures: Fat Layer (Subcutaneous Fat Layer (Subcutaneous Fascia: No Tissue) Exposed: Yes Tissue) Exposed: Yes Fat Layer (Subcutaneous Fascia: No Tendon: Yes Tissue) Exposed: No Tendon: No Fascia: No Tendon: No Muscle: No Muscle: No Muscle: No Joint: No Joint: No Joint: No Bone: No Bone: No Bone: No Epithelialization: None None None Periwound Skin Texture: Scarring: Yes Excoriation: No Excoriation: No Induration: No Induration: No Callus: No Callus: No Crepitus: No Crepitus: No Rash: No Rash: No Scarring: No Scarring: No Periwound Skin Moisture: No Abnormalities Noted Maceration: No Maceration: No Dry/Scaly: No Dry/Scaly: No Periwound Skin Color: Hemosiderin Staining: Yes Ecchymosis: Yes Ecchymosis: Yes Atrophie Blanche: No Atrophie Blanche: No Cyanosis: No Cyanosis: No Erythema: No Erythema: No Hemosiderin Staining: No Hemosiderin Staining: No Mottled: No Mottled: No Pallor: No Pallor: No Rubor: No Rubor: No Temperature: No Abnormality No Abnormality No Abnormality Tenderness on Palpation: Yes Yes Yes Wound Preparation: Ulcer Cleansing: Ulcer Cleansing: Ulcer Cleansing: Rinsed/Irrigated with Saline, Rinsed/Irrigated with Saline, Rinsed/Irrigated with Saline, Other: soap and water Other: soap and water Other: soap and water Topical Anesthetic Applied: Topical Anesthetic Applied: Topical Anesthetic Applied: Other: lidocaine 4% Other: lidocaine 4% Other: lidocaine 4% Wound Number: [id number]  Photos: No Photos Wound Location: Left Foot Right Foot Left Toe - Web between 2nd and 3rd Wounding Event: Pressure Injury Pressure Injury Gradually Appeared Primary Etiology: Diabetic Wound/Ulcer  of the Diabetic Wound/Ulcer of the Diabetic Wound/Ulcer of the Lower Extremity Lower Extremity Lower Extremity Secondary Etiology: N/A N/A N/A Comorbid History: Cataracts, Anemia, Chronic Cataracts, Anemia, Chronic N/A Obstructive Pulmonary Obstructive Pulmonary Disease (COPD), Arrhythmia, Disease (COPD), Arrhythmia, NIHAL, DOAN. ([id number]) Hypertension, Type II Hypertension, Type II Diabetes, End Stage Renal Diabetes, End Stage Renal Disease, Gout, Rheumatoid Disease, Gout, Rheumatoid Arthritis Arthritis Date Acquired: 04/05/2018 04/05/2018 04/14/2018 Weeks of Treatment: 6 6 5  Wound Status: Open Open Converted Clustered Wound: Yes Yes No Clustered Quantity: N/A 6 N/A Measurements L x W x D 5.3x7.6x0.1 4x6.3x0.1 N/A (cm) Area (cm) : 31.636 19.792 N/A Volume (cm) : 3.164 1.979 N/A % Reduction in Area: -1913.70% -425.00% N/A % Reduction in Volume: -1915.30% -424.90% N/A Classification: Grade 1 Grade 1 Grade 1 Exudate Amount: Medium Medium N/A Exudate Type: Purulent Purulent N/A Exudate Color: yellow, brown, green yellow, brown, green N/A Foul Odor After Cleansing: Yes Yes N/A Odor Anticipated Due to No No N/A Product Use: Wound Margin:  Distinct, outline attached Distinct, outline attached N/A Granulation Amount: Small (1-33%) Small (1-33%) N/A Granulation Quality: Red Red N/A Necrotic Amount: Large (67-100%) Large (67-100%) N/A Necrotic Tissue: Eschar, Adherent Slough Eschar, Adherent Slough N/A Exposed Structures: Fat Layer (Subcutaneous Fascia: No N/A Tissue) Exposed: Yes Fat Layer (Subcutaneous Fascia: No Tissue) Exposed: No Tendon: No Tendon: No Muscle: No Muscle: No Joint: No Joint: No Bone: No Bone: No Epithelialization: None None N/A Periwound Skin Texture: Excoriation: No Excoriation: No No Abnormalities Noted Induration: No Induration: No Callus: No Callus: No Crepitus: No Crepitus: No Rash: No Rash: No Scarring: No Scarring: No Periwound  Skin Moisture: Maceration: No Maceration: No No Abnormalities Noted Dry/Scaly: No Dry/Scaly: No Periwound Skin Color: Atrophie Blanche: No Atrophie Blanche: No No Abnormalities Noted Cyanosis: No Cyanosis: No Ecchymosis: No Ecchymosis: No Erythema: No Erythema: No Hemosiderin Staining: No Hemosiderin Staining: No Mottled: No Mottled: No Pallor: No Pallor: No Rubor: No Rubor: No Temperature: No Abnormality No Abnormality N/A Tenderness on Palpation: Yes Yes No Wound Preparation: Ulcer Cleansing: Ulcer Cleansing: N/A Rinsed/Irrigated with Saline, Rinsed/Irrigated with Saline, Other: soap and water Other: soap and water ALLYANA, VOGAN F. 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 Lower Extremity Lower Extremity Secondary Etiology: N/A N/A N/A Comorbid History: N/A N/A N/A Date Acquired: 04/14/2018 04/14/2018 04/14/2018 Weeks of Treatment: 5 5 5  Wound Status: Converted Converted Converted Clustered Wound: No No No Clustered Quantity: N/A N/A N/A Measurements L x W x D N/A N/A N/A (cm) Area (cm) : N/A N/A N/A Volume (cm) : N/A N/A N/A % Reduction in Area: N/A N/A N/A % Reduction in Volume: N/A N/A N/A Classification: Grade 1 Grade 1 Grade 1 Exudate Amount: N/A N/A N/A Exudate Type: N/A N/A N/A Exudate Color: N/A N/A N/A Foul Odor After Cleansing: N/A N/A N/A Odor Anticipated Due to N/A N/A N/A Product Use: Wound Margin: N/A N/A N/A Granulation Amount: N/A N/A N/A Granulation Quality: N/A N/A N/A Necrotic Amount: N/A N/A N/A Necrotic Tissue:  N/A N/A N/A Exposed Structures: N/A N/A N/A Epithelialization: N/A N/A N/A Periwound Skin Texture: No Abnormalities Noted No Abnormalities Noted No Abnormalities Noted Periwound Skin Moisture: No Abnormalities Noted No Abnormalities Noted No Abnormalities Noted Periwound Skin Color: No Abnormalities Noted No Abnormalities Noted No Abnormalities Noted Temperature: N/A N/A N/A Tenderness on Palpation: No No No Wound Preparation: N/A N/A N/A Treatment Notes Wound #1 (Right, Anterior Lower Leg) 1. Cleansed with: Clean wound with Normal Saline 2. Anesthetic Topical Lidocaine 4% cream to wound bed prior to debridement 4. Dressing Applied: Other dressing (specify in notes) 5. Secondary Dressing Applied ABD Pad SHAHLA, BETSILL F. ([id number]) Notes silvercell weaved between toes and on wound Wound #2 (Right, Posterior Lower Leg) 1. Cleansed with: Clean wound with Normal Saline 2. Anesthetic Topical Lidocaine 4% cream to wound bed prior to debridement 4. Dressing Applied: Other dressing (specify in notes) 5. Secondary Dressing Applied ABD Pad Notes silvercell weaved between toes and on wound Wound #3 (Right, Lateral Calcaneus) 1. Cleansed with: Clean wound with Normal Saline 2. Anesthetic Topical Lidocaine 4% cream to wound bed prior to debridement 4. Dressing Applied: Other dressing (specify  in notes) 5. Secondary Dressing Applied ABD Pad Notes silvercell weaved between toes and on wound Wound #4 (Left Foot) 1. Cleansed with: Clean wound with Normal Saline 2. Anesthetic Topical Lidocaine 4% cream to wound bed prior to debridement 4. Dressing Applied: Other dressing (specify in notes) 5. Secondary Dressing Applied ABD Pad Notes silvercell weaved between toes and on wound Wound #5 (Right Foot) 1. Cleansed with: Clean wound with Normal Saline 2. Anesthetic Topical Lidocaine 4% cream to wound bed prior to debridement 4. Dressing Applied: Other dressing (specify in  notes) 5. Secondary Dressing Applied ABD Pad Notes silvercell weaved between toes and on wound CLORIA, CIRESI ([id number]) Electronic Signature(s) Signed: 05/19/2018 5:35:06 PM By: Linton Ham MD Previous Signature: 05/19/2018 5:18:29 PM Version By: Montey Hora Entered By: Linton Ham on 05/19/2018 17:20:05 [name] ([id number]) -------------------------------------------------------------------------------- [place] Details Patient Name: DASHAY, GIESLER. Date of Service: 05/19/2018 1:30 PM Medical Record Number: [id number] Patient Account Number: [id number] Date of Birth/Sex: 1946-08-26 (72 y.o. F) Treating RN: Montey Hora Primary Care Malachai Schalk: Sharilyn Sites Other Clinician: Referring Journey Castonguay: Sharilyn Sites Treating Hasheem Voland/Extender: Tito Dine in Treatment: 10 Active Inactive ` Necrotic Tissue Nursing Diagnoses: Impaired tissue integrity related to necrotic/devitalized tissue Goals: Necrotic/devitalized tissue will be minimized in the wound bed Date Initiated: 03/10/2018 Target Resolution Date: 05/10/2018 Goal Status: Active Patient/caregiver will verbalize understanding of reason and process for debridement of necrotic tissue Date Initiated: 03/10/2018 Target Resolution Date: 03/25/2018 Goal Status: Active Interventions: Assess patient pain level pre-, during and post procedure and prior to discharge Treatment Activities: Apply topical anesthetic as ordered : 03/10/2018 Notes: ` Orientation to the Wound Care Program Nursing Diagnoses: Knowledge deficit related to the wound healing center program Goals: Patient/caregiver will verbalize understanding of the [place] Date Initiated: 03/10/2018 Target Resolution Date: 04/02/2018 Goal Status: Active Interventions: Provide education on orientation to the wound center Notes: ` Soft Tissue Infection Nursing Diagnoses: Impaired tissue  integrity Potential for infection: soft tissue MARCINE, GADWAY ([id number]) Goals: Patient will remain free of wound infection Date Initiated: 03/10/2018 Target Resolution Date: 04/02/2018 Goal Status: Active Interventions: Assess signs and symptoms of infection every visit Notes: ` Wound/Skin Impairment Nursing Diagnoses: Impaired tissue integrity Goals: Ulcer/skin breakdown will have a volume reduction of 80% by week 12 Date Initiated: 03/10/2018 Target Resolution Date: 06/10/2018 Goal Status: Active Interventions: Assess ulceration(s) every visit Treatment Activities: Skin care regimen initiated : 03/10/2018 Topical wound management initiated : 03/10/2018 Notes: Electronic Signature(s) Signed: 05/19/2018 5:18:29 PM By: Montey Hora Entered By: Montey Hora on 05/19/2018 14:14:58 [name] ([id number]) -------------------------------------------------------------------------------- Pain Assessment Details Patient Name: [name]. Date of Service: 05/19/2018 1:30 PM Medical Record Number: [id number] Patient Account Number: [id number] Date of Birth/Sex: 13-Jan-1946 (72 y.o. F) Treating RN: Secundino Ginger Primary Care Aliyanna Wassmer: Sharilyn Sites Other Clinician: Referring Acadia Thammavong: Sharilyn Sites Treating Rutger Salton/Extender: Tito Dine in Treatment: 10 Active Problems Location of Pain Severity and Description of Pain Patient Has Paino Yes Site Locations Rate the pain. Current Pain Level: 9 Character of Pain Describe the Pain: Aching, Throbbing Pain Management and Medication Current Pain Management: Electronic Signature(s) Signed: 05/19/2018 3:41:35 PM By: Secundino Ginger Entered By: Secundino Ginger on 05/19/2018 13:31:47 [name] ([id number]) -------------------------------------------------------------------------------- Patient/Caregiver Education Details Patient Name: [name]. Date of Service: 05/19/2018 1:30 PM Medical  Record Number: [id number] Patient Account Number: [id number] Date of Birth/Gender: 23-Nov-1945 (72 y.o. F) Treating RN: Ahmed Prima Primary Care Physician: Sharilyn Sites Other Clinician:  Referring Physician: Sharilyn Sites Treating Physician/Extender: Tito Dine in Treatment: 10 Education Assessment Education Provided To: Patient Education Topics Provided Wound Debridement: Handouts: Wound Debridement Methods: Explain/Verbal Responses: State content correctly Wound/Skin Impairment: Handouts: Caring for Your Ulcer Methods: Explain/Verbal Responses: State content correctly Electronic Signature(s) Signed: 05/19/2018 4:43:18 PM By: Alric Quan Entered By: Alric Quan on 05/19/2018 14:56:34 Lloyd Huger F. ([id number]) -------------------------------------------------------------------------------- Wound Assessment Details Patient Name: [name]. Date of Service: 05/19/2018 1:30 PM Medical Record Number: [id number] Patient Account Number: [id number] Date of Birth/Sex: [birth year] (72 y.o. F) Treating RN: Secundino Ginger Primary Care Curry Dulski: Sharilyn Sites Other Clinician: Referring Basia Mcginty: Sharilyn Sites Treating Madellyn Denio/Extender: Tito Dine in Treatment: 10 Wound Status Wound Number: 1 Primary Calciphylaxis Etiology: Wound Location: Right Lower Leg - Anterior Secondary Diabetic Wound/Ulcer of the Lower Extremity Wounding Event: Gradually Appeared Etiology: Date Acquired: 06/29/2017 Wound Open Weeks Of Treatment: 10 Status: Clustered Wound: No Comorbid Cataracts, Anemia, Chronic Obstructive History: Pulmonary Disease (COPD), Arrhythmia, Hypertension, Type II Diabetes, End Stage Renal Disease, Gout, Rheumatoid Arthritis Photos Photo Uploaded By: Alric Quan on 05/19/2018 16:41:29 Wound Measurements Length: (cm) 31.5 Width: (cm) 7.7 Depth: (cm) 0.3 Area: (cm) 190.498 Volume: (cm) 57.149 % Reduction in Area:  -449.3% % Reduction in Volume: -723.8% Epithelialization: None Tunneling: No Undermining: No Wound Description Full Thickness Without Exposed Support Classification: Structures Wound Margin: Flat and Intact Exudate Medium Amount: Exudate Type: Purulent Exudate Color: yellow, brown, green Foul Odor After Cleansing: No Slough/Fibrino Yes Wound Bed Granulation Amount: Small (1-33%) Exposed Structure Granulation Quality: Red, Pink Fascia Exposed: No Necrotic Amount: Large (67-100%) Fat Layer (Subcutaneous Tissue) Exposed: Yes Necrotic Quality: Eschar, Adherent Slough Tendon Exposed: No YAA, DONNELLAN F. ([id number]) Muscle Exposed: No Joint Exposed: No Bone Exposed: No Periwound Skin Texture Texture Color No Abnormalities Noted: No No Abnormalities Noted: No Scarring: Yes Hemosiderin Staining: Yes Moisture Temperature / Pain No Abnormalities Noted: No Temperature: No Abnormality Tenderness on Palpation: Yes Wound Preparation Ulcer Cleansing: Rinsed/Irrigated with Saline, Other: soap and water, Topical Anesthetic Applied: Other: lidocaine 4%, Treatment Notes Wound #1 (Right, Anterior Lower Leg) 1. Cleansed with: Clean wound with Normal Saline 2. Anesthetic Topical Lidocaine 4% cream to wound bed prior to debridement 4. Dressing Applied: Other dressing (specify in notes) 5. Secondary Dressing Applied ABD Pad Notes silvercell weaved between toes and on wound Electronic Signature(s) Signed: 05/19/2018 3:41:35 PM By: Secundino Ginger Entered By: Secundino Ginger on 05/19/2018 13:54:08 [name] ([id number]) -------------------------------------------------------------------------------- Wound Assessment Details Patient Name: [name]. Date of Service: 05/19/2018 1:30 PM Medical Record Number: [id number] Patient Account Number: [id number] Date of Birth/Sex: [birth year] (72 y.o. 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 Wounding Event: Gradually Appeared Etiology: Date Acquired: 06/29/2017 Wound Open Weeks Of Treatment: 10 Status: Clustered Wound: No Comorbid Cataracts, Anemia, Chronic Obstructive History: Pulmonary Disease (COPD), Arrhythmia, Hypertension, Type II Diabetes, End Stage Renal Disease, Gout, Rheumatoid Arthritis Photos Photo Uploaded By: Alric Quan on 05/19/2018 16:41:29 Wound Measurements Length: (cm) 12.2 Width: (cm) 9.1 Depth: (cm) 0.5 Area: (cm) 87.195 Volume: (cm) 43.597 % Reduction in Area: 24% % Reduction in Volume: -26.7% Epithelialization: None Tunneling: No Undermining: No Wound Description Full Thickness With Exposed Support Classification: Structures Wound Margin: Distinct, outline attached Exudate Large Amount: Exudate Type: Serosanguineous  Exudate Color: red, brown Foul Odor After Cleansing: No Slough/Fibrino Yes Wound Bed Granulation Amount: Medium (34-66%) Exposed Structure Granulation Quality: Red Fascia Exposed: No Necrotic Amount: Medium (34-66%) Fat Layer (Subcutaneous Tissue) Exposed: Yes Necrotic Quality: Eschar, Adherent Slough Tendon Exposed: Yes TAMIEKA, RANCOURT F. ([id number]) Muscle Exposed: No Joint Exposed: No Bone Exposed: No Periwound Skin Texture Texture Color No Abnormalities Noted: No No Abnormalities Noted: No Callus: No Atrophie Blanche: No Crepitus: No Cyanosis: No Excoriation: No Ecchymosis: Yes Induration: No Erythema: No Rash: No Hemosiderin Staining: No Scarring: No Mottled: No Pallor: No Moisture Rubor: No No Abnormalities Noted: No Dry / Scaly: No Temperature / Pain Maceration: No Temperature: No Abnormality Tenderness  on Palpation: Yes Wound Preparation Ulcer Cleansing: Rinsed/Irrigated with Saline, Other: soap and water, Topical Anesthetic Applied: Other: lidocaine 4%, Treatment Notes Wound #2 (Right, Posterior Lower Leg) 1. Cleansed with: Clean wound with Normal Saline 2. Anesthetic Topical Lidocaine 4% cream to wound bed prior to debridement 4. Dressing Applied: Other dressing (specify in notes) 5. Secondary Dressing Applied ABD Pad Notes silvercell weaved between toes and on wound Electronic Signature(s) Signed: 05/19/2018 3:41:35 PM By: Secundino Ginger Entered By: Secundino Ginger on 05/19/2018 13:55:50 [name] ([id number]) -------------------------------------------------------------------------------- Wound Assessment Details Patient Name: [name]. Date of Service: 05/19/2018 1:30 PM Medical Record Number: [id number] Patient Account Number: [id number] Date of Birth/Sex: March 06, 1946 (72 y.o. F) Treating RN: Secundino Ginger Primary Care Irby Fails: Sharilyn Sites Other Clinician: Referring Melisa Donofrio: Sharilyn Sites Treating Srihan Brutus/Extender: Tito Dine in Treatment: 10 Wound Status Wound Number: 3 Primary Calciphylaxis Etiology: Wound Location: Right Calcaneus - Lateral Secondary Diabetic Wound/Ulcer of the Lower Extremity Wounding Event: Gradually Appeared Etiology: Date Acquired: 06/29/2017 Wound Open Weeks Of Treatment: 10 Status: Clustered Wound: No Comorbid Cataracts, Anemia, Chronic Obstructive History: Pulmonary Disease (COPD), Arrhythmia, Hypertension, Type II Diabetes, End Stage Renal Disease, Gout, Rheumatoid Arthritis Photos Photo Uploaded By: Alric Quan on 05/19/2018 16:42:15 Wound Measurements Length: (cm) 2 Width: (cm) 8.3 Depth: (cm) 0.2 Area: (cm) 13.038 Volume: (cm) 2.608 % Reduction in Area: -1409% % Reduction in Volume: -2932.6% Epithelialization: None Tunneling: No Undermining: No Wound Description Full Thickness Without  Exposed Support Classification: Structures Wound Margin: Distinct, outline attached Exudate Large Amount: Exudate Type: Purulent Exudate Color: yellow, brown, green Foul Odor After Cleansing: No Slough/Fibrino Yes Wound Bed Granulation Amount: None Present (0%) Exposed Structure Necrotic Amount: Large (67-100%) Fascia Exposed: No Necrotic Quality: Eschar Fat Layer (Subcutaneous Tissue) Exposed: No Tendon Exposed: No Mort, Janin F. ([id number]) Muscle Exposed: No Joint Exposed: No Bone Exposed: No Periwound Skin Texture Texture Color No Abnormalities Noted: No No Abnormalities Noted: No Callus: No Atrophie Blanche: No Crepitus: No Cyanosis: No Excoriation: No Ecchymosis: Yes Induration: No Erythema: No Rash: No Hemosiderin Staining: No Scarring: No Mottled: No Pallor: No Moisture Rubor: No No Abnormalities Noted: No Dry / Scaly: No Temperature / Pain Maceration: No Temperature: No Abnormality Tenderness on Palpation: Yes Wound Preparation Ulcer Cleansing: Rinsed/Irrigated with Saline, Other: soap and water, Topical Anesthetic Applied: Other: lidocaine 4%, Treatment Notes Wound #3 (Right, Lateral Calcaneus) 1. Cleansed with: Clean wound with Normal Saline 2. Anesthetic Topical Lidocaine 4% cream to wound bed prior to debridement 4. Dressing Applied: Other dressing (specify in notes) 5. Secondary Dressing Applied ABD Pad Notes silvercell weaved between toes and on wound Electronic Signature(s) Signed: 05/19/2018 3:41:35 PM By: Secundino Ginger Entered By: Secundino Ginger on 05/19/2018 13:57:31 Lloyd Huger F. ([id number]) -------------------------------------------------------------------------------- Wound Assessment Details Patient Name: [name]. Date of Service:  05/19/2018 1:30 PM Medical Record Number: [id number] Patient Account Number: [id number] Date of Birth/Sex: 05-03-1946 (72 y.o. F) Treating RN: Secundino Ginger Primary Care Zerah Hilyer:  Sharilyn Sites Other Clinician: Referring Nina Mondor: Sharilyn Sites Treating Rebie Peale/Extender: Tito Dine in Treatment: 10 Wound Status Wound Number: 4 Primary Diabetic Wound/Ulcer of the Lower Extremity Etiology: Wound Location: Left Foot Wound Open Wounding Event: Pressure Injury Status: Date Acquired: 04/05/2018 Comorbid Cataracts, Anemia, Chronic Obstructive Weeks Of Treatment: 6 History: Pulmonary Disease (COPD), Arrhythmia, Clustered Wound: Yes Hypertension, Type II Diabetes, End Stage Renal Disease, Gout, Rheumatoid Arthritis Photos Photo Uploaded By: Alric Quan on 05/19/2018 16:42:16 Wound Measurements Length: (cm) 5.3 Width: (cm) 7.6 Depth: (cm) 0.1 Area: (cm) 31.636 Volume: (cm) 3.164 % Reduction in Area: -1913.7% % Reduction in Volume: -1915.3% Epithelialization: None Tunneling: No Undermining: No Wound Description Classification: Grade 1 Wound Margin: Distinct, outline attached Exudate Amount: Medium Exudate Type: Purulent Exudate Color: yellow, brown, green Foul Odor After Cleansing: Yes Due to Product Use: No Slough/Fibrino Yes Wound Bed Granulation Amount: Small (1-33%) Exposed Structure Granulation Quality: Red Fascia Exposed: No Necrotic Amount: Large (67-100%) Fat Layer (Subcutaneous Tissue) Exposed: Yes Necrotic Quality: Eschar, Adherent Slough Tendon Exposed: No Muscle Exposed: No Joint Exposed: No Bone Exposed: No Murial, Beam Hermila F. ([id number]) Periwound Skin Texture Texture Color No Abnormalities Noted: No No Abnormalities Noted: No Callus: No Atrophie Blanche: No Crepitus: No Cyanosis: No Excoriation: No Ecchymosis: No Induration: No Erythema: No Rash: No Hemosiderin Staining: No Scarring: No Mottled: No Pallor: No Moisture Rubor: No No Abnormalities Noted: No Dry / Scaly: No Temperature / Pain Maceration: No Temperature: No Abnormality Tenderness on Palpation: Yes Wound Preparation Ulcer  Cleansing: Rinsed/Irrigated with Saline, Other: soap and water, Topical Anesthetic Applied: Other: lidocaine 4%, Treatment Notes Wound #4 (Left Foot) 1. Cleansed with: Clean wound with Normal Saline 2. Anesthetic Topical Lidocaine 4% cream to wound bed prior to debridement 4. Dressing Applied: Other dressing (specify in notes) 5. Secondary Dressing Applied ABD Pad Notes silvercell weaved between toes and on wound Electronic Signature(s) Signed: 05/19/2018 3:41:35 PM By: Secundino Ginger Entered By: Secundino Ginger on 05/19/2018 13:51:42 Lloyd Huger F. ([id number]) -------------------------------------------------------------------------------- Wound Assessment Details Patient Name: [name]. Date of Service: 05/19/2018 1:30 PM Medical Record Number: [id number] Patient Account Number: [id number] Date of Birth/Sex: October 09, 1945 (72 y.o. F) Treating RN: Secundino Ginger Primary Care Jarold Macomber: Sharilyn Sites Other Clinician: Referring Usiel Astarita: Sharilyn Sites Treating Nelly Scriven/Extender: Tito Dine in Treatment: 10 Wound Status Wound Number: 5 Primary Diabetic Wound/Ulcer of the Lower Extremity Etiology: Wound Location: Right Foot Wound Open Wounding Event: Pressure Injury Status: Date Acquired: 04/05/2018 Comorbid Cataracts, Anemia, Chronic Obstructive Weeks Of Treatment: 6 History: Pulmonary Disease (COPD), Arrhythmia, Clustered Wound: Yes Hypertension, Type II Diabetes, End Stage Renal Disease, Gout, Rheumatoid Arthritis Photos Photo Uploaded By: Alric Quan on 05/19/2018 16:42:49 Wound Measurements Length: (cm) 4 Width: (cm) 6.3 Depth: (cm) 0.1 Clustered Quantity: 6 Area: (cm) 19.792 Volume: (cm) 1.979 % Reduction in Area: -425% % Reduction in Volume: -424.9% Epithelialization: None Tunneling: No Undermining: No Wound Description Classification: Grade 1 Wound Margin: Distinct, outline attached Exudate Amount: Medium Exudate Type:  Purulent Exudate Color: yellow, brown, green Foul Odor After Cleansing: Yes Due to Product Use: No Slough/Fibrino Yes Wound Bed Granulation Amount: Small (1-33%) Exposed Structure Granulation Quality: Red Fascia Exposed: No Necrotic Amount: Large (67-100%) Fat Layer (Subcutaneous Tissue) Exposed: No Necrotic Quality: Eschar, Adherent Slough Tendon Exposed: No Muscle Exposed: No Joint Exposed: No Bone Exposed: No Varney, Andrey F. (  [id number]) Periwound Skin Texture Texture Color No Abnormalities Noted: No No Abnormalities Noted: No Callus: No Atrophie Blanche: No Crepitus: No Cyanosis: No Excoriation: No Ecchymosis: No Induration: No Erythema: No Rash: No Hemosiderin Staining: No Scarring: No Mottled: No Pallor: No Moisture Rubor: No No Abnormalities Noted: No Dry / Scaly: No Temperature / Pain Maceration: No Temperature: No Abnormality Tenderness on Palpation: Yes Wound Preparation Ulcer Cleansing: Rinsed/Irrigated with Saline, Other: soap and water, Topical Anesthetic Applied: Other: lidocaine 4%, Treatment Notes Wound #5 (Right Foot) 1. Cleansed with: Clean wound with Normal Saline 2. Anesthetic Topical Lidocaine 4% cream to wound bed prior to debridement 4. Dressing Applied: Other dressing (specify in notes) 5. Secondary Dressing Applied ABD Pad Notes silvercell weaved between toes and on wound Electronic Signature(s) Signed: 05/19/2018 3:41:35 PM By: Secundino Ginger Entered By: Secundino Ginger on 05/19/2018 13:50:27 [name] ([id number]) -------------------------------------------------------------------------------- Wound Assessment Details Patient Name: [name]. Date of Service: 05/19/2018 1:30 PM Medical Record Number: [id number] Patient Account Number: [id number] Date of Birth/Sex: Nov 20, 1945 (72 y.o. F) Treating RN: Secundino Ginger Primary Care Eaton Folmar: Sharilyn Sites Other Clinician: Referring Aairah Negrette: Sharilyn Sites Treating  Dosha Broshears/Extender: Tito Dine in Treatment: 10 Wound Status Wound Number: 6 Primary Diabetic Wound/Ulcer of the Lower Etiology: Extremity Wound Location: Left Toe - Web between 2nd and 3rd Wound Status: Converted Wounding Event: Gradually Appeared Date Acquired: 04/14/2018 Weeks Of Treatment: 5 Clustered Wound: No Wound Description Classification: Grade 1 Periwound Skin Texture Texture Color No Abnormalities Noted: No No Abnormalities Noted: No Moisture No Abnormalities Noted: No Electronic Signature(s) Signed: 05/19/2018 3:41:35 PM By: Secundino Ginger Entered By: Secundino Ginger on 05/19/2018 13:45:12 [name] ([id number]) -------------------------------------------------------------------------------- Wound Assessment Details Patient Name: [name]. Date of Service: 05/19/2018 1:30 PM Medical Record Number: [id number] Patient Account Number: [id number] Date of Birth/Sex: [birth year] (72 y.o. F) Treating RN: Secundino Ginger Primary Care Jj Enyeart: Sharilyn Sites Other Clinician: Referring Carmalita Wakefield: Sharilyn Sites Treating Rashaan Wyles/Extender: Tito Dine in Treatment: 10 Wound Status Wound Number: 7 Primary Diabetic Wound/Ulcer of the Lower Etiology: Extremity Wound Location: Left Toe - Web between 3rd and 4th Wound Status: Converted Wounding Event: Gradually Appeared Date Acquired: 04/14/2018 Weeks Of Treatment: 5 Clustered Wound: No Wound Description Classification: Grade 1 Periwound Skin Texture Texture Color No Abnormalities Noted: No No Abnormalities Noted: No Moisture No Abnormalities Noted: No Electronic Signature(s) Signed: 05/19/2018 3:41:35 PM By: Secundino Ginger Entered By: Secundino Ginger on 05/19/2018 13:45:12 [name] ([id number]) -------------------------------------------------------------------------------- Wound Assessment Details Patient Name: [name]. Date of Service: 05/19/2018 1:30 PM Medical Record  Number: [id number] Patient Account Number: [id number] Date of Birth/Sex: 01/11/1946 (72 y.o. F) Treating RN: Secundino Ginger Primary Care Margaret Cockerill: Sharilyn Sites Other Clinician: Referring Eddi Hymes: Sharilyn Sites Treating Monteen Toops/Extender: Ricard Dillon Weeks in Treatment: 10 Wound Status Wound Number: 8 Primary Diabetic Wound/Ulcer of the Lower Etiology: Extremity Wound Location: Dorsal Toe Great Wound Status: Converted Wounding Event: Gradually Appeared Date Acquired: 04/14/2018 Weeks Of Treatment: 5 Clustered Wound: No Wound Description Classification: Grade 1 Periwound Skin Texture Texture Color No Abnormalities Noted: No No Abnormalities Noted: No Moisture No Abnormalities Noted: No Electronic Signature(s) Signed: 05/19/2018 3:41:35 PM By: Secundino Ginger Entered By: Secundino Ginger on 05/19/2018 13:45:12 [name] ([id number]) -------------------------------------------------------------------------------- Wound Assessment Details Patient Name: [name]. Date of Service: 05/19/2018 1:30 PM Medical Record Number: [id number] Patient Account Number: [id number] Date of Birth/Sex: December 03, 1945 (72 y.o. F) Treating RN: Secundino Ginger Primary Care Regino Fournet: Sharilyn Sites Other Clinician: Referring  Myer Bohlman: Sharilyn Sites Treating Nawaf Strange/Extender: Tito Dine in Treatment: 10 Wound Status Wound Number: 9 Primary Diabetic Wound/Ulcer of the Lower Etiology: Extremity Wound Location: Right Toe - Web between 2nd and 3rd Wound Status: Converted Wounding Event: Gradually Appeared Date Acquired: 04/14/2018 Weeks Of Treatment: 5 Clustered Wound: No Wound Description Classification: Grade 1 Periwound Skin Texture Texture Color No Abnormalities Noted: No No Abnormalities Noted: No Moisture No Abnormalities Noted: No Electronic Signature(s) Signed: 05/19/2018 3:41:35 PM By: Secundino Ginger Entered By: Secundino Ginger on 05/19/2018 13:45:12 [name]  ([id number]) -------------------------------------------------------------------------------- Vitals Details Patient Name: [name]. Date of Service: 05/19/2018 1:30 PM Medical Record Number: [id number] Patient Account Number: [id number] Date of Birth/Sex: [birth year] (72 y.o. F) Treating RN: Secundino Ginger Primary Care Laiklynn Raczynski: Sharilyn Sites Other Clinician: Referring Khristine Verno: Sharilyn Sites Treating Malik Ruffino/Extender: Tito Dine in Treatment: 10 Vital Signs Time Taken: 13:32 Temperature (F): 98.3 Height (in): 65 Pulse (bpm): 77 Weight (lbs): 164 Respiratory Rate (breaths/min): 16 Body Mass Index (BMI): 27.3 Blood Pressure (mmHg): 109/49 Reference Range: 80 - 120 mg / dl Electronic Signature(s) Signed: 05/19/2018 3:41:35 PM By: Secundino Ginger Entered BySecundino Ginger on 05/19/2018 13:33:54

## 2018-05-28 NOTE — Progress Notes (Signed)
ERYCA, BOLTE ([id number]) Visit Report for 05/19/2018 HPI Details Patient Name: [name], [name]. Date of Service: 05/19/2018 1:30 PM Medical Record Number: [id number] Patient Account Number: [id number] Date of Birth/Sex: Apr 06, 1946 (72 y.o. F) Treating RN: Cornell Barman Primary Care Provider: Sharilyn Sites Other Clinician: Referring Provider: Sharilyn Sites Treating Provider/Extender: Tito Dine in Treatment: 10 History of Present Illness HPI Description: ADMISSION 03/10/18; This is a 72 year old woman with a history of end-stage renal disease on hemodialysis, type 2 diabetes on insulin with excellent control, systemic lupus. Her problem began in October 2018 she developed a blister in an open wound on the right anterior tibial mid tibial area. This progressed and worsened until the tissue loss was substantial on the anterior and posterior right calf. she was followed in [place] by physical therapy at one point although I have not reviewed their notes. She apparently did not receive pulse lavage to the wound. I note that she was hospitalized from 72/3/19 through 03/03/18 predominantly for debridement of the wound by general surgery. She saw Dr. Arnoldo Morale who did not believe there was a role for surgical debridement. Was felt that definitive treatment would be amputation but the patient already been offered this by vein and vascular in [facility] and she refused arrangements were made for her to be sent to this clinic for review. The patient is already had a reasonably substantial review for this problem. she saw vein and vascular extensively over the course of this year.her noninvasive arterial studies done in December 2018 suggested a right superficial femoral artery occlusion/stenosis. She did not have any issues on the left. At that point her ABI on the right was 0.8 with monophasic waveforms 1.06 on the left. However she had a velocity drop off in the superficial femoral  artery. She went on to have diagnostic angiogram on 12/18/17 by Dr. Oneida Alar. This showed on the right that the common femoral, profunda femoris and superficial femoral arteries were all patent. There was a focal less than 1 mm length stenosis on the right superficial femoral artery otherwise the artery was widely patent. The right popliteal artery was widely patent. There was 3 vessel runoff via the anterior, tibial and paranoid Milta Deiters arteries to the right foot. She was not felt to have a vascular cause of these wounds. 04/28/18; really no progress at all. Fact the area of the right anterior leg is worse more necrotic tissue. Also more exposed tendon on the right posterior leg and I think the left leg is developing same thing. Her calf looks increasingly pale and almost mottled looking. She has had breakdown on her toes. The patient is seen her rheumatologist that [facility]. From this point of view I would wonder about antiphospholipid syndrome oShe is also seeing Dr. Shanon Rosser of vascular in [place]. By discussion with her husband I don't think Dr. Doren Custard felt that anything further was to be done even given the breakdown on the left leg. Also if I can review his notes to that effect oShe is seeing Dr. Lillia Carmel of [facility] dermatology this afternoon I am hopeful that their pathologist will give [place] a diagnosis. I am increasingly worried that we are on her way to a similar situation in the left sHe's also had 2 separate biopsies. Firstly on 3/25 showed nonspecific findings with calciphylaxis not entirely excluded. No definite calcium deposits were seen within the dermis within the sections reviewed. Vascular features were nonspecific. Furthermore pyogenic granuloma may demonstrate similar findings. It looks as though  she had a second biopsy on 12/24/17 this showed stasis changes with lipoma membranous panniculitis and arterial vascular insufficiency. Calciphylaxis was not favored in  this specimen. There was a calcified vessel. I don't see a pathology specimen that specifically says definitive calciphylaxis. The patient had an x-ray of her right tibia while she is in hospital there was no osteomyelitis. She had an MRI of her foot in March that did not show osteomyelitis as well The patient has been using Betadine and wraps to the wounds. This is being done by a home health where they live in [place]. ([id number]) 03/17/18; patient admitted to clinic last week. 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 Considerable amount of necrotic surface material that I removed from the posterior calf last week and again this week. The area anteriorly is far too adherent at this point. She lives in [place]. She has advanced home care home health. Dialyzes in [place] as well. 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 There is very apparent necrotic material in the anterior side. We've been using Santyl anteriorly and silver alginate posteriorly. 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 I've gotten her an appointment at [facility] dermatology next week hopefully to have the original biopsy sites reviewed. The patient is still in a lot of pain. She is not straightening the leg out. She had an angiogram that did not suggest coexistent macrovascular disease 04/07/18; absolutely no improvement in this lady's condition. Extensive wounds on the right anterior covered in necrotic surface and the right posterior calf. There is better looking tissue posteriorly on the right over there is a large exposed tendon. In addition today she seems to develop some wounds on her toes on the left foot which were new this week especially the left third toe most circumferential loss. This unfortunate woman is in a lot of pain and I once again I've talked to her about  an amputation of the right leg which she doesn't want to consider. 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 She has a large necrotic covered wound on the right anterior tibia oLarge full-thickness wound on the right posterior tibia with healthier looking tissue but a large exposed piece of tendon oNecrotic wound on the right lateral heel oThe new problem this week is wounds on her toes 04/14/18; this patient's overall condition in the right leg is unchanged she has 3 open areas of large necrotic area anteriorly. Better-looking surface but exposed tendon in the large wound posteriorly and an area on her right lateral calcaneus oShe has new wounds this week on the left first second and third toes Although these all look vascular previous arteriogram on the right did not show an obvious macrovascular etiology. This does not rule out a Small vesselvasculopathy. I have her being followed by Dr. Lillia Carmel at [facility] dermatology to see if they can review the slides already done to see if we can establish a specific etiology i.e. calciphylaxis, antiphospholipid syndrome [patient has SLE]; or some other cause of this. I have not wanted to attempt to rebiopsy this in our clinic leaving that to the academic dermatologist's. I note she is now on Levaquin I can see in [place] that the dermatologist culture something that was Pseudomonas. I'm not sure what they cultured. 05/19/18; is more than a month since I last  saw this patient. She is not doing well. I have notes from [facility] dermatology a careful review by Dr. Hazle Quant. Apparently additional biopsies were done that did not suggest vasculitis or vasculopathy. In addition don't believe that there is any evidence of calcification suggesting calciphylaxis. She did culture MSSA and was given a course of Keflex which she is finished. They referred her to [facility] hematology to consider  a workup for antiphospholipid syndrome. The patient has not done well. She is developed increasing areas of necrosis in the right anterior leg both superiorly and inferiorly. All of her toes on the right foot are ischemic as are on the left foot. She saw Dr. Doren Custard last month to noted her previously normal arteriogram I believe in April. I was really shocked a day to see how much worse this was. Patient rates her pain on the right leg at 8 out of 10 Electronic Signature(s) Signed: 05/19/2018 5:35:06 PM By: Linton Ham MD Entered By: Linton Ham on 05/19/2018 17:22:46 [name] ([id number]) -------------------------------------------------------------------------------- Physical Exam Details Patient Name: [name], [name]. Date of Service: 05/19/2018 1:30 PM Medical Record Number: [id number] Patient Account Number: [id number] Date of Birth/Sex: 05/10/1946 (72 y.o. F) Treating RN: Cornell Barman Primary Care Provider: Sharilyn Sites Other Clinician: Referring Provider: Sharilyn Sites Treating Provider/Extender: Tito Dine in Treatment: 10 Constitutional Sitting or standing Blood Pressure is within target range for patient.. Pulse regular and within target range for patient.Marland Kitchen Respirations regular, non-labored and within target range.. Temperature is normal and within the target range for the patient.Marland Kitchen appears in no distress.the patient looks uncomfortable but not in any distress. Respiratory Respiratory effort is easy and symmetric bilaterally. Rate is normal at rest and on room air.. Cardiovascular Pedal pulses palpable and strong bilaterally.. Lymphatic none palpable in the popliteal area bilaterally. Integumentary (Hair, Skin) I see no other primary skin issue. Psychiatric the patient looks somewhat depressed and in pain. Notes wound exam; oOn the right leg things looked terrible passive areas of tissue necrosis from just below her knee down to her  anterior foot. Large area of necrosis posteriorly. All of her toes look ischemic this is all new since the last time I've seen her. Nothing looks overtly infected but I was startled to see how much this is deteriorated. oOn the left side all over 5 left toes look ischemic. This looks like dry gangrene Electronic Signature(s) Signed: 05/19/2018 5:35:06 PM By: Linton Ham MD Entered By: Linton Ham on 05/19/2018 17:29:41 [name] ([id number]) -------------------------------------------------------------------------------- Physician Orders Details Patient Name: [name]. Date of Service: 05/19/2018 1:30 PM Medical Record Number: [id number] Patient Account Number: [id number] Date of Birth/Sex: [birth year] (72 y.o. F) Treating RN: Montey Hora Primary Care Provider: Sharilyn Sites Other Clinician: Referring Provider: Sharilyn Sites Treating Provider/Extender: Tito Dine in Treatment: 10 Verbal / Phone Orders: No Diagnosis Coding Wound Cleansing Wound #1 Right,Anterior Lower Leg o Clean wound with Normal Saline. o Cleanse wound with mild soap and water Wound #2 Right,Posterior Lower Leg o Clean wound with Normal Saline. o Cleanse wound with mild soap and water Wound #3 Right,Lateral Calcaneus o Clean wound with Normal Saline. o Cleanse wound with mild soap and water Wound #4 Left Foot o Clean wound with Normal Saline. o Cleanse wound with mild soap and water Wound #5 Right Foot o Clean wound with Normal Saline. o Cleanse wound with mild soap and water Anesthetic (add to Medication List) Wound #1 Right,Anterior Lower Leg o Topical Lidocaine 4%  cream applied to wound bed prior to debridement (In Clinic Only). Wound #2 Right,Posterior Lower Leg o Topical Lidocaine 4% cream applied to wound bed prior to debridement (In Clinic Only). Wound #3 Right,Lateral Calcaneus o Topical Lidocaine 4% cream applied to wound bed  prior to debridement (In Clinic Only). Wound #4 Left Foot o Topical Lidocaine 4% cream applied to wound bed prior to debridement (In Clinic Only). Wound #5 Right Foot o Topical Lidocaine 4% cream applied to wound bed prior to debridement (In Clinic Only). Primary Wound Dressing Wound #1 Right,Anterior Lower Leg o Silver Alginate Wound #2 Right,Posterior Lower Leg o Silver Alginate Wound #3 Right,Lateral Calcaneus [name], Dana F. ([id number]) o Silver Alginate Wound #4 Left Foot o Silver Alginate Wound #5 Right Foot o Silver Alginate Secondary Dressing Wound #1 Right,Anterior Lower Leg o ABD pad Wound #2 Right,Posterior Lower Leg o ABD pad Wound #3 Right,Lateral Calcaneus o ABD pad Wound #4 Left Foot o ABD pad Wound #5 Right Foot o ABD pad Dressing Change Frequency Wound #1 Right,Anterior Lower Leg o Change Dressing Monday, Wednesday, Friday - HHRN to change on Friday and Monday Wound #2 Right,Posterior Lower Leg o Change Dressing Monday, Wednesday, Friday - HHRN to change on Friday and Monday Wound #3 Right,Lateral Calcaneus o Change Dressing Monday, Wednesday, Friday - HHRN to change on Friday and Monday Wound #4 Left Foot o Change Dressing Monday, Wednesday, Friday - HHRN to change on Friday and Monday Wound #5 Right Foot o Change Dressing Monday, Wednesday, Friday - HHRN to change on Friday and Monday Follow-up Appointments Wound #1 Right,Anterior Lower Leg o Return Appointment in 1 week. Wound #2 Right,Posterior Lower Leg o Return Appointment in 1 week. Wound #3 Right,Lateral Calcaneus o Return Appointment in 1 week. Wound #4 Left Foot o Return Appointment in 1 week. Wound #5 Right Foot o Return Appointment in 1 week. [name], Dana F. ([id number]) Edema Control Wound #1 Right,Anterior Lower Leg o Kerlix and Coban - Right Lower Extremity Wound #2 Right,Posterior Lower Leg o Kerlix and Coban - Right  Lower Extremity Wound #3 Right,Lateral Calcaneus o Kerlix and Coban - Right Lower Extremity Wound #4 Left Foot o Kerlix and Coban - Right Lower Extremity Wound #5 Right Foot o Kerlix and Coban - Right Lower Extremity Home Health Wound #1 Right,Anterior Lower Leg o [place] Nurse may visit PRN to address patientos wound care needs. o FACE TO FACE ENCOUNTER: MEDICARE and MEDICAID PATIENTS: I certify that this patient is under my care and that I had a face-to-face encounter that meets the physician face-to-face encounter requirements with this patient on this date. The encounter with the patient was in whole or in part for the following MEDICAL CONDITION: (primary reason for [place]) MEDICAL NECESSITY: I certify, that based on my findings, NURSING services are a medically necessary home health service. HOME BOUND STATUS: I certify that my clinical findings support that this patient is homebound (i.e., Due to illness or injury, pt requires aid of supportive devices such as crutches, cane, wheelchairs, walkers, the use of special transportation or the assistance of another person to leave their place of residence. There is a normal inability to leave the home and doing so requires considerable and taxing effort. Other absences are for medical reasons / religious services and are infrequent or of short duration when for other reasons). o If current dressing causes regression in wound condition, may D/C ordered dressing product/s and apply Normal Saline Moist Dressing daily  until next [place] / Other MD appointment. [place] of regression in wound condition at [phone]. o Please direct any NON-WOUND related issues/requests for orders to patient's Primary Care Physician Wound #2 [place] Nurse may visit PRN to address patientos wound care  needs. o FACE TO FACE ENCOUNTER: MEDICARE and MEDICAID PATIENTS: I certify that this patient is under my care and that I had a face-to-face encounter that meets the physician face-to-face encounter requirements with this patient on this date. The encounter with the patient was in whole or in part for the following MEDICAL CONDITION: (primary reason for [place]) MEDICAL NECESSITY: I certify, that based on my findings, NURSING services are a medically necessary home health service. HOME BOUND STATUS: I certify that my clinical findings support that this patient is homebound (i.e., Due to illness or injury, pt requires aid of supportive devices such as crutches, cane, wheelchairs, walkers, the use of special transportation or the assistance of another person to leave their place of residence. There is a normal inability to leave the home and doing so requires considerable and taxing effort. Other absences are for medical reasons / religious services and are infrequent or of short duration when for other reasons). o If current dressing causes regression in wound condition, may D/C ordered dressing product/s and apply Normal Saline Moist Dressing daily until next [place] / Other MD appointment. [place] of regression in wound condition at [phone]. o Please direct any NON-WOUND related issues/requests for orders to patient's Primary Care Physician Wound #3 [place] Visits o Home Health Nurse may visit PRN to address patientos wound care needs. o FACE TO FACE ENCOUNTER: MEDICARE and MEDICAID PATIENTS: I certify that this patient is under my care and that I had a face-to-face encounter that meets the physician face-to-face encounter requirements with this [name], [name]. ([id number]) patient on this date. The encounter with the patient was in whole or in part for the following MEDICAL CONDITION: (primary  reason for [place]) MEDICAL NECESSITY: I certify, that based on my findings, NURSING services are a medically necessary home health service. HOME BOUND STATUS: I certify that my clinical findings support that this patient is homebound (i.e., Due to illness or injury, pt requires aid of supportive devices such as crutches, cane, wheelchairs, walkers, the use of special transportation or the assistance of another person to leave their place of residence. There is a normal inability to leave the home and doing so requires considerable and taxing effort. Other absences are for medical reasons / religious services and are infrequent or of short duration when for other reasons). o If current dressing causes regression in wound condition, may D/C ordered dressing product/s and apply Normal Saline Moist Dressing daily until next [place] / Other MD appointment. [place] of regression in wound condition at [phone]. o Please direct any NON-WOUND related issues/requests for orders to patient's Primary Care Physician Wound #4 Left [place] Visits o Home Health Nurse may visit PRN to address patientos wound care needs. o FACE TO FACE ENCOUNTER: MEDICARE and MEDICAID PATIENTS: I certify that this patient is under my care and that I had a face-to-face encounter that meets the physician face-to-face encounter requirements with this patient on this date. The encounter with the patient was in whole or in part for the following MEDICAL CONDITION: (primary  reason for Home Healthcare) MEDICAL NECESSITY: I certify, that based on my findings, NURSING services are a medically necessary home health service. HOME BOUND STATUS: I certify that my clinical findings support that this patient is homebound (i.e., Due to illness or injury, pt requires aid of supportive devices such as crutches, cane, wheelchairs, walkers, the use of special transportation  or the assistance of another person to leave their place of residence. There is a normal inability to leave the home and doing so requires considerable and taxing effort. Other absences are for medical reasons / religious services and are infrequent or of short duration when for other reasons). o If current dressing causes regression in wound condition, may D/C ordered dressing product/s and apply Normal Saline Moist Dressing daily until next [place] / Other MD appointment. [place] of regression in wound condition at [phone]. o Please direct any NON-WOUND related issues/requests for orders to patient's Primary Care Physician Wound #5 Right [place] Visits o Home Health Nurse may visit PRN to address patientos wound care needs. o FACE TO FACE ENCOUNTER: MEDICARE and MEDICAID PATIENTS: I certify that this patient is under my care and that I had a face-to-face encounter that meets the physician face-to-face encounter requirements with this patient on this date. The encounter with the patient was in whole or in part for the following MEDICAL CONDITION: (primary reason for [place]) MEDICAL NECESSITY: I certify, that based on my findings, NURSING services are a medically necessary home health service. HOME BOUND STATUS: I certify that my clinical findings support that this patient is homebound (i.e., Due to illness or injury, pt requires aid of supportive devices such as crutches, cane, wheelchairs, walkers, the use of special transportation or the assistance of another person to leave their place of residence. There is a normal inability to leave the home and doing so requires considerable and taxing effort. Other absences are for medical reasons / religious services and are infrequent or of short duration when for other reasons). o If current dressing causes regression in wound condition, may D/C ordered dressing product/s  and apply Normal Saline Moist Dressing daily until next [place] / Other MD appointment. [place] of regression in wound condition at [phone]. o Please direct any NON-WOUND related issues/requests for orders to patient's Primary Care Physician Electronic Signature(s) Signed: 05/19/2018 5:18:29 PM By: Montey Hora Signed: 05/19/2018 5:35:06 PM By: Linton Ham MD Entered By: Montey Hora on 05/19/2018 14:28:38 [name] ([id number]) -------------------------------------------------------------------------------- Problem List Details Patient Name: [name], [name]. Date of Service: 05/19/2018 1:30 PM Medical Record Number: [id number] Patient Account Number: [id number] Date of Birth/Sex: 27-Jan-1946 (72 y.o. 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/10/2018 No Yes muscle 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 03/10/2018 No Yes E11.622 Type 2 diabetes mellitus with other skin ulcer 03/10/2018 No Yes L89.610 Pressure ulcer of right heel, unstageable 03/10/2018 No Yes L97.529 Non-pressure chronic ulcer of other part of left foot with 04/15/2018 No Yes unspecified severity I13.2 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 Date of Service: 05/19/2018 1:30 PM Medical Record Number: [id number] Patient Account Number: [id number] Date of Birth/Sex: 1945-11-04 (72 y.o. F) Treating RN: Cornell Barman Primary Care Provider: Sharilyn Sites Other Clinician: Referring Provider: Sharilyn Sites Treating Provider/Extender: Tito Dine in Treatment: 10 Subjective History of Present Illness (HPI) ADMISSION 03/10/18; This is a 72 year old woman with a history of end-stage renal disease on hemodialysis, type 2 diabetes on insulin with excellent control, systemic lupus. Her problem began in October 2018 she developed a blister in an open wound on the right anterior tibial mid tibial area. This progressed and worsened until the tissue loss was substantial on the anterior and posterior right calf. she was followed in [place] by physical therapy at one point although I have not reviewed their notes. She apparently did not receive pulse lavage to the wound. I note that she was hospitalized from 72/3/19 through 03/03/18 predominantly for debridement of the wound by general surgery. She saw Dr. Arnoldo Morale who did not believe there was a role for surgical debridement. Was felt that definitive treatment would be amputation but the patient already been offered this by vein and vascular in [facility] and she refused arrangements were made for her to be sent to this clinic for review. The patient is already had a reasonably substantial review for this problem. she saw vein and vascular extensively over the course of this year.her noninvasive arterial studies done in December 2018 suggested a right superficial femoral artery occlusion/stenosis. She did not have any issues on the left. At that point her ABI on the right was 0.8 with monophasic waveforms 1.06 on the left. However she had a velocity drop off in the superficial femoral artery. She went on to have diagnostic angiogram on 12/18/17 by Dr. Oneida Alar. This  showed on the right that the common femoral, profunda femoris and superficial femoral arteries were all patent. There was a focal less than 1 mm length stenosis on the right superficial femoral artery otherwise the artery was widely patent. The right popliteal artery was widely patent. There was 3 vessel runoff via the anterior, tibial and paranoid Milta Deiters arteries to the right foot. She was not felt to have a vascular cause of these wounds. 04/28/18; really no progress at all. Fact the area of the right anterior leg is worse more necrotic tissue. Also more exposed tendon on the right posterior leg and I think the left leg is developing same thing. Her calf looks increasingly pale and almost mottled looking. She has had breakdown on her toes. The patient is seen her rheumatologist that [facility]. From this point of view I would wonder about antiphospholipid syndrome She is also seeing Dr. Shanon Rosser of vascular in [place]. By discussion with her husband I don't think Dr. Doren Custard felt that anything further was to be done even given the breakdown on the left leg. Also if I can review his notes to that effect She is seeing Dr. Lillia Carmel of [facility] dermatology this afternoon I am hopeful that their pathologist will give [place] a diagnosis. I am increasingly worried that we are on her way to a similar situation in the left sHe's also had 2 separate biopsies. Firstly on 3/25 showed nonspecific findings with calciphylaxis not entirely excluded. No definite calcium deposits were seen within the dermis within the sections reviewed. Vascular features were nonspecific. Furthermore pyogenic granuloma may demonstrate  similar findings. 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 Calciphylaxis was not favored in this specimen. There was a calcified vessel. I don't see a pathology specimen that  specifically says definitive calciphylaxis. The patient had an x-ray of her right tibia while she is in hospital there was no osteomyelitis. She had an MRI of her foot in March that did not show osteomyelitis as well The patient has been using Betadine and wraps to the wounds. This is being done by a home health where they live in [place] 03/17/18; patient admitted to clinic last week. 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 ([id number]) greater than right anterior calf. Considerable amount of necrotic surface material that I removed from the posterior calf last week and again this week. The area anteriorly is far too adherent at this point. She lives in [place]. She has advanced home care home health. [facility] in [place] as well. 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 There is very apparent necrotic material in the anterior side. We've been using Santyl anteriorly and silver alginate posteriorly. 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 I've gotten her an appointment at [facility] dermatology next week hopefully to have the original biopsy sites reviewed. The patient is still in a lot of pain. She is not straightening the leg out. She had an angiogram that did not suggest coexistent macrovascular disease 04/07/18; absolutely no improvement in this lady's condition. Extensive wounds on the right anterior covered in necrotic surface and the right posterior calf. There is better looking tissue posteriorly on the right over there is a large exposed tendon. In addition today she seems to develop some wounds on her toes on the left foot which were new this week especially the left third toe most circumferential loss. This unfortunate woman is in a lot of pain and I once again I've talked to her about an amputation of the right leg which she doesn't want to consider. The working  diagnosis year has been calciphylaxis however I'd like to really confirm this one way or the other. She has a large necrotic covered wound on the right anterior tibia Large full-thickness wound on the right posterior tibia with healthier looking tissue but a large exposed piece of tendon Necrotic wound on the right lateral heel The new problem this week is wounds on her toes 04/14/18; this patient's overall condition in the right leg is unchanged she has 3 open areas of large necrotic area anteriorly. Better-looking surface but exposed tendon in the large wound posteriorly and an area on her right lateral calcaneus She has new wounds this week on the left first second and third toes Although these all look vascular previous arteriogram on the right did not show an obvious macrovascular etiology. This does not rule out a Small vesselvasculopathy. I have her being followed by Dr. Lillia Carmel at [facility] dermatology to see if they can review the slides already done to see if we can establish a specific etiology i.e. calciphylaxis, antiphospholipid syndrome [patient has SLE]; or some other cause of this. I have not wanted to attempt to rebiopsy this in our clinic leaving that to the academic dermatologist's. I note she is now on Levaquin I can see in [place] that the dermatologist culture something that was Pseudomonas. I'm not sure what they cultured. 05/19/18; is more  than a month since I last saw this patient. She is not doing well. I have notes from [facility] dermatology a careful review by Dr. Hazle Quant. Apparently additional biopsies were done that did not suggest vasculitis or vasculopathy. In addition don't believe that there is any evidence of calcification suggesting calciphylaxis. She did culture MSSA and was given a course of Keflex which she is finished. They referred her to [facility] hematology to consider a workup for antiphospholipid syndrome. The patient has not done well. She is  developed increasing areas of necrosis in the right anterior leg both superiorly and inferiorly. All of her toes on the right foot are ischemic as are on the left foot. She saw Dr. Doren Custard last month to noted her previously normal arteriogram I believe in April. I was really shocked a day to see how much worse this was. Patient rates her pain on the right leg at 8 out of 10 Objective Constitutional Sitting or standing Blood Pressure is within target range for patient.. Pulse regular and within target range for patient.Marland Kitchen Respirations regular, non-labored and within target range.. Temperature is normal and within the target range for the patient.Marland Kitchen appears in no distress.the patient looks uncomfortable but not in any distress. Vitals Time Taken: 1:32 PM, Height: 65 in, Weight: 164 lbs, BMI: 27.3, Temperature: 98.3 F, Pulse: 77 bpm, Respiratory Rate: 16 breaths/min, Blood Pressure: 109/49 mmHg. [name], Dana F. ([id number]) Respiratory Respiratory effort is easy and symmetric bilaterally. Rate is normal at rest and on room air.. Cardiovascular Pedal pulses palpable and strong bilaterally.. Lymphatic none palpable in the popliteal area bilaterally. Psychiatric the patient looks somewhat depressed and in pain. General Notes: wound exam; On the right leg things looked terrible passive areas of tissue necrosis from just below her knee down to her anterior foot. Large area of necrosis posteriorly. All of her toes look ischemic this is all new since the last time I've seen her. Nothing looks overtly infected but I was startled to see how much this is deteriorated. On the left side all over 5 left toes look ischemic. This looks like dry gangrene Integumentary (Hair, Skin) I see no other primary skin issue. Wound #1 status is Open. Original cause of wound was Gradually Appeared. The wound is located on the Right,Anterior Lower Leg. The wound measures 31.5cm length x 7.7cm width x 0.3cm depth;  190.498cm^2 area and 57.149cm^3 volume. There is Fat Layer (Subcutaneous Tissue) Exposed exposed. There is no tunneling or undermining noted. There is a medium amount of purulent drainage noted. The wound margin is flat and intact. There is small (1-33%) red, pink granulation within the wound bed. There is a large (67-100%) amount of necrotic tissue within the wound bed including Eschar and Adherent Slough. The periwound skin appearance exhibited: Scarring, Hemosiderin Staining. Periwound temperature was noted as No Abnormality. The periwound has tenderness on palpation. Wound #2 status is Open. Original cause of wound was Gradually Appeared. The wound is located on the Right,Posterior Lower Leg. The wound measures 12.2cm length x 9.1cm width x 0.5cm depth; 87.195cm^2 area and 43.597cm^3 volume. There is tendon and Fat Layer (Subcutaneous Tissue) Exposed exposed. There is no tunneling or undermining noted. There is a large amount of serosanguineous drainage noted. The wound margin is distinct with the outline attached to the wound base. There is medium (34-66%) red granulation within the wound bed. There is a medium (34-66%) amount of necrotic tissue within the wound bed including Eschar and Adherent Slough. The periwound skin appearance  exhibited: Ecchymosis. The periwound skin appearance did not exhibit: Callus, Crepitus, Excoriation, Induration, Rash, Scarring, Dry/Scaly, Maceration, Atrophie Blanche, Cyanosis, Hemosiderin Staining, Mottled, Pallor, Rubor, Erythema. Periwound temperature was noted as No Abnormality. The periwound has tenderness on palpation. Wound #3 status is Open. Original cause of wound was Gradually Appeared. The wound is located on the Right,Lateral Calcaneus. The wound measures 2cm length x 8.3cm width x 0.2cm depth; 13.038cm^2 area and 2.608cm^3 volume. There is no tunneling or undermining noted. There is a large amount of purulent drainage noted. The wound margin is  distinct with the outline attached to the wound base. There is no granulation within the wound bed. There is a large (67-100%) amount of necrotic tissue within the wound bed including Eschar. The periwound skin appearance exhibited: Ecchymosis. The periwound skin appearance did not exhibit: Callus, Crepitus, Excoriation, Induration, Rash, Scarring, Dry/Scaly, Maceration, Atrophie Blanche, Cyanosis, Hemosiderin Staining, Mottled, Pallor, Rubor, Erythema. Periwound temperature was noted as No Abnormality. The periwound has tenderness on palpation. Wound #4 status is Open. Original cause of wound was Pressure Injury. The wound is located on the Left Foot. The wound measures 5.3cm length x 7.6cm width x 0.1cm depth; 31.636cm^2 area and 3.164cm^3 volume. There is Fat Layer (Subcutaneous Tissue) Exposed exposed. There is no tunneling or undermining noted. There is a medium amount of purulent drainage noted. Foul odor after cleansing was noted. The wound margin is distinct with the outline attached to the wound base. There is small (1-33%) red granulation within the wound bed. There is a large (67-100%) amount of necrotic tissue within the wound bed including Eschar and Adherent Slough. The periwound skin appearance did not exhibit: Callus, Crepitus, Excoriation, Induration, Rash, Scarring, Dry/Scaly, Maceration, Atrophie Blanche, Cyanosis, Ecchymosis, Hemosiderin Staining, Mottled, Pallor, Rubor, Erythema. Periwound temperature was noted as No Abnormality. The periwound has tenderness on palpation. [name], Dana F. ([id number]) Wound #5 status is Open. Original cause of wound was Pressure Injury. The wound is located on the Right Foot. The wound measures 4cm length x 6.3cm width x 0.1cm depth; 19.792cm^2 area and 1.979cm^3 volume. There is no tunneling or undermining noted. There is a medium amount of purulent drainage noted. Foul odor after cleansing was noted. The wound margin is distinct with the  outline attached to the wound base. There is small (1-33%) red granulation within the wound bed. There is a large (67-100%) amount of necrotic tissue within the wound bed including Eschar and Adherent Slough. The periwound skin appearance did not exhibit: Callus, Crepitus, Excoriation, Induration, Rash, Scarring, Dry/Scaly, Maceration, Atrophie Blanche, Cyanosis, Ecchymosis, Hemosiderin Staining, Mottled, Pallor, Rubor, Erythema. Periwound temperature was noted as No Abnormality. The periwound has tenderness on palpation. Wound #6 status is Converted. Original cause of wound was Gradually Appeared. The wound is located on the Left Toe - Web between 2nd and 3rd. Wound #7 status is Converted. Original cause of wound was Gradually Appeared. The wound is located on the Left Toe - Web between 3rd and 4th. Wound #8 status is Converted. Original cause of wound was Gradually Appeared. The wound is located on the Dorsal Toe Great. Wound #9 status is Converted. Original cause of wound was Gradually Appeared. The wound is located on the Right Toe - Web between 2nd and 3rd. 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 Cleanse wound with mild soap and water Wound #2 Right,Posterior Lower Leg: Clean wound with Normal Saline. Cleanse wound with mild soap and water Wound #3 Right,Lateral Calcaneus: Clean wound with Normal Saline. Cleanse wound with mild soap and water Wound #4 Left Foot: Clean wound with Normal  Saline. Cleanse wound with mild soap and water Wound #5 Right Foot: [name], Dana F. ([id number]) Clean wound with Normal Saline. Cleanse wound with mild soap and water Anesthetic (add to Medication List): Wound #1 Right,Anterior Lower Leg: Topical Lidocaine 4% cream applied to wound bed prior to debridement (In Clinic Only). Wound #2 Right,Posterior Lower Leg: Topical Lidocaine 4% cream applied to wound bed prior to debridement (In Clinic Only). Wound #3 Right,Lateral Calcaneus: Topical Lidocaine 4% cream applied to wound bed prior to debridement (In Clinic Only). Wound #4 Left Foot: Topical Lidocaine 4% cream applied to wound bed prior to debridement (In Clinic Only). Wound #5 Right Foot: Topical Lidocaine 4% cream applied to wound bed prior to debridement (In Clinic Only). Primary Wound Dressing: Wound #1 Right,Anterior Lower Leg: Silver Alginate Wound #2 Right,Posterior Lower Leg: Silver Alginate Wound #3 Right,Lateral Calcaneus: Silver Alginate Wound #4 Left Foot: Silver Alginate Wound #5 Right Foot: Silver Alginate Secondary Dressing: Wound #1 Right,Anterior Lower Leg: ABD pad Wound #2 Right,Posterior Lower Leg: ABD pad Wound #3 Right,Lateral Calcaneus: ABD pad Wound #4 Left Foot: ABD pad Wound #5 Right Foot: ABD pad Dressing Change Frequency: Wound #1 Right,Anterior Lower Leg: Change Dressing Monday, Wednesday, Friday - HHRN to change on Friday and Monday Wound #2 Right,Posterior Lower Leg: Change Dressing Monday, Wednesday, Friday - HHRN to change on Friday and Monday Wound #3 Right,Lateral Calcaneus: Change Dressing Monday, Wednesday, Friday - HHRN to change on Friday and Monday Wound #4 Left Foot: Change Dressing Monday, Wednesday, Friday - HHRN to change on Friday and Monday Wound #5 Right Foot: Change Dressing Monday, Wednesday, Friday - HHRN to change on Friday and Monday Follow-up Appointments: Wound #1 Right,Anterior Lower Leg: Return Appointment  in 1 week. Wound #2 Right,Posterior Lower Leg: Return Appointment in 1 week. Wound #3 Right,Lateral Calcaneus: Return Appointment in 1 week. Wound #4 Left Foot: Return Appointment in 1 week. Wound #5 Right Foot: Return Appointment in 1 week. Edema Control: Wound #1 Right,Anterior Lower Leg: Kerlix and Coban - Right Lower Extremity [name], [name]. ([id number]) Wound #2 Right,Posterior Lower Leg: Kerlix and Coban - Right Lower Extremity Wound #3 Right,Lateral Calcaneus: Kerlix and Coban - Right Lower Extremity Wound #4 Left Foot: Kerlix and Coban - Right Lower Extremity Wound #5 Right Foot: Kerlix and Coban - Right Lower Extremity Home Health: Wound #1 Right,Anterior Lower Leg: [place] Nurse may visit PRN to address patient s wound care needs. FACE TO FACE ENCOUNTER: MEDICARE and MEDICAID PATIENTS: I certify that this patient is under my care and that I had a face-to-face encounter that meets the physician face-to-face encounter requirements with this patient on this date. The encounter with the patient was in whole or in part for the following MEDICAL CONDITION: (primary reason for [place]) MEDICAL NECESSITY: I certify, that based on my findings, NURSING services are a medically necessary home health service. HOME BOUND STATUS: I certify that my clinical findings support that this patient is homebound (i.e., Due to  illness or injury, pt requires aid of supportive devices such as crutches, cane, wheelchairs, walkers, the use of special transportation or the assistance of another person to leave their place of residence. There is a normal inability to leave the home and doing so requires considerable and taxing effort. Other absences are for medical reasons / religious services and are infrequent or of short duration when for other reasons). If current dressing causes regression in wound condition, may D/C ordered dressing product/s and  apply Normal Saline Moist Dressing daily until next [place] / Other MD appointment. [place] of regression in wound condition at [phone]. Please direct any NON-WOUND related issues/requests for orders to patient's Primary Care Physician Wound #2 Right,Posterior Lower Leg: [place] Nurse may visit PRN to address patient s wound care needs. FACE TO FACE ENCOUNTER: MEDICARE and MEDICAID PATIENTS: I certify that this patient is under my care and that I had a face-to-face encounter that meets the physician face-to-face encounter requirements with this patient on this date. The encounter with the patient was in whole or in part for the following MEDICAL CONDITION: (primary reason for [place]) MEDICAL NECESSITY: I certify, that based on my findings, NURSING services are a medically necessary home health service. HOME BOUND STATUS: I certify that my clinical findings support that this patient is homebound (i.e., Due to illness or injury, pt requires aid of supportive devices such as crutches, cane, wheelchairs, walkers, the use of special transportation or the assistance of another person to leave their place of residence. There is a normal inability to leave the home and doing so requires considerable and taxing effort. Other absences are for medical reasons / religious services and are infrequent or of short duration when for other reasons). If current dressing causes regression in wound condition, may D/C ordered dressing product/s and apply Normal Saline Moist Dressing daily until next [place] / Other MD appointment. [place] of regression in wound condition at [phone]. Please direct any NON-WOUND related issues/requests for orders to patient's Primary Care Physician Wound #3 Right,Lateral Calcaneus: [place] Nurse may visit PRN to address patient s wound  care needs. FACE TO FACE ENCOUNTER: MEDICARE and MEDICAID PATIENTS: I certify that this patient is under my care and that I had a face-to-face encounter that meets the physician face-to-face encounter requirements with this patient on this date. The encounter with the patient was in whole or in part for the following MEDICAL CONDITION: (primary reason for [place]) MEDICAL NECESSITY: I certify, that based on my findings, NURSING services are a medically necessary home health service. HOME BOUND STATUS: I certify that my clinical findings support that this patient is homebound (i.e., Due to illness or injury, pt requires aid of supportive devices such as crutches, cane, wheelchairs, walkers, the use of special transportation or the assistance of another person to leave their place of residence. There is a normal inability to leave the home and doing so requires considerable and taxing effort. Other absences are for medical reasons / religious services and are infrequent or of short duration when for other reasons). If current dressing causes regression in wound condition, may D/C ordered dressing product/s and apply Normal Saline Moist Dressing daily until next [place] / Other MD appointment. [place] of regression in wound condition at [phone]. Please direct any NON-WOUND related issues/requests for orders to patient's Primary Care Physician Wound #  4 Left Foot: [place] Nurse may visit PRN to address patient s wound care needs. [name], [name] F. ([id number]) FACE TO FACE ENCOUNTER: MEDICARE and MEDICAID PATIENTS: I certify that this patient is under my care and that I had a face-to-face encounter that meets the physician face-to-face encounter requirements with this patient on this date. The encounter with the patient was in whole or in part for the following MEDICAL CONDITION: (primary reason for [place])  MEDICAL NECESSITY: I certify, that based on my findings, NURSING services are a medically necessary home health service. HOME BOUND STATUS: I certify that my clinical findings support that this patient is homebound (i.e., Due to illness or injury, pt requires aid of supportive devices such as crutches, cane, wheelchairs, walkers, the use of special transportation or the assistance of another person to leave their place of residence. There is a normal inability to leave the home and doing so requires considerable and taxing effort. Other absences are for medical reasons / religious services and are infrequent or of short duration when for other reasons). If current dressing causes regression in wound condition, may D/C ordered dressing product/s and apply Normal Saline Moist Dressing daily until next [place] / Other MD appointment. [place] of regression in wound condition at [phone]. Please direct any NON-WOUND related issues/requests for orders to patient's Primary Care Physician Wound #5 Right Foot: [place] Nurse may visit PRN to address patient s wound care needs. FACE TO FACE ENCOUNTER: MEDICARE and MEDICAID PATIENTS: I certify that this patient is under my care and that I had a face-to-face encounter that meets the physician face-to-face encounter requirements with this patient on this date. The encounter with the patient was in whole or in part for the following MEDICAL CONDITION: (primary reason for [place]) MEDICAL NECESSITY: I certify, that based on my findings, NURSING services are a medically necessary home health service. HOME BOUND STATUS: I certify that my clinical findings support that this patient is homebound (i.e., Due to illness or injury, pt requires aid of supportive devices such as crutches, cane, wheelchairs, walkers, the use of special transportation or the assistance of another person to leave  their place of residence. There is a normal inability to leave the home and doing so requires considerable and taxing effort. Other absences are for medical reasons / religious services and are infrequent or of short duration when for other reasons). If current dressing causes regression in wound condition, may D/C ordered dressing product/s and apply Normal Saline Moist Dressing daily until next [place] / Other MD appointment. [place] of regression in wound condition at [phone]. Please direct any NON-WOUND related issues/requests for orders to patient's Primary Care Physician #1 at this point I am really at a loss to know what a primary diagnosis is here. The biopsy results done by dermatology suggested venous ulceration I don't think this is the issue. There is too much for rapid tissue necrosis here. I wonder about antiphospholipid syndrome associated with her lupus although I think this would be somewhat unusual presentation for this which usually involves more central arterial beds but I'm certainly not an expert on this. #2 on the left she has ischemic-looking toes #3 I've told the patient and her husband that no matter what the diagnosis is here I think there is too much tissue necrosis on the right leg and too much pain for her to  continue like this. I think she is going to need a right above-knee amputation. They said they would think this over. The patient saw Dr. Doren Custard about a month ago and I be willing to contact him to see if his group would be willing to go ahead and do the surgery #4 I still think she needs the antiphospholipid workup at [facility] hematology. At one point I was hopeful that there was a more specific diagnosis here like calciphylaxis, pyoderma gangrenosum etc. however this has evolved into something that is clearly involving vascular compromise. Her toes look like dry gangrene #5 I last the patient to contact me if she agrees with the  amputation and I'll try to help make arrangements for the Electronic Signature(s) Signed: 05/19/2018 5:35:06 PM By: Linton Ham MD Entered By: Linton Ham on 05/19/2018 17:33:12 [name] ([id number]) -------------------------------------------------------------------------------- [facility] Patient Name: [name]. Date of Service: 05/19/2018 Medical Record Number: [id number] Patient Account Number: [id number] Date of Birth/Sex: 1945-11-25 (72 y.o. F) Treating RN: Cornell Barman Primary Care Provider: Sharilyn Sites Other Clinician: Referring Provider: Sharilyn Sites Treating Provider/Extender: Tito Dine in Treatment: 10 Diagnosis Coding ICD-10 Codes Code Description [phone] 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scle 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 E11.622 Type 2 diabetes mellitus with other skin ulcer L89.610 Pressure ulcer of right heel, unstageable L97.529 Non-pressure chronic ulcer of other part of left foot with unspecified severity Hypertensive heart and chronic Little disease with heart failure and with stage 5 chronic Little I13.2 disease, or end stage renal disease Facility Procedures CPT4 Code: 21308657 Description: 84696 - WOUND CARE VISIT-LEV 5 EST PT Modifier: Quantity: 1 Physician Procedures CPT4 Code Description: [id number] 99213 - WC PHYS LEVEL 3 - EST PT ICD-10 Diagnosis Description L97.213 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scle L97.529 Non-pressure chronic ulcer of other part of left foot with unspec Modifier: ified severity Quantity: 1 Electronic Signature(s) Signed: 05/19/2018 5:35:06 PM By: Linton Ham MD Entered By: Linton Ham on 05/19/2018 17:33:44

## 2018-05-29 DIAGNOSIS — N2581 Secondary hyperparathyroidism of renal origin: Secondary | ICD-10-CM | POA: Diagnosis not present

## 2018-05-29 DIAGNOSIS — Z992 Dependence on renal dialysis: Secondary | ICD-10-CM | POA: Diagnosis not present

## 2018-05-29 DIAGNOSIS — D631 Anemia in chronic kidney disease: Secondary | ICD-10-CM | POA: Diagnosis not present

## 2018-05-29 DIAGNOSIS — D509 Iron deficiency anemia, unspecified: Secondary | ICD-10-CM | POA: Diagnosis not present

## 2018-05-29 DIAGNOSIS — D689 Coagulation defect, unspecified: Secondary | ICD-10-CM | POA: Diagnosis not present

## 2018-05-29 DIAGNOSIS — E1129 Type 2 diabetes mellitus with other diabetic kidney complication: Secondary | ICD-10-CM | POA: Diagnosis not present

## 2018-05-29 DIAGNOSIS — N186 End stage renal disease: Secondary | ICD-10-CM | POA: Diagnosis not present

## 2018-05-29 DIAGNOSIS — E876 Hypokalemia: Secondary | ICD-10-CM | POA: Diagnosis not present

## 2018-05-30 DIAGNOSIS — Z992 Dependence on renal dialysis: Secondary | ICD-10-CM | POA: Diagnosis not present

## 2018-05-30 DIAGNOSIS — E1129 Type 2 diabetes mellitus with other diabetic kidney complication: Secondary | ICD-10-CM | POA: Diagnosis not present

## 2018-05-30 DIAGNOSIS — N186 End stage renal disease: Secondary | ICD-10-CM | POA: Diagnosis not present

## 2018-05-31 DIAGNOSIS — E785 Hyperlipidemia, unspecified: Secondary | ICD-10-CM | POA: Diagnosis not present

## 2018-05-31 DIAGNOSIS — L89610 Pressure ulcer of right heel, unstageable: Secondary | ICD-10-CM | POA: Diagnosis not present

## 2018-05-31 DIAGNOSIS — E11622 Type 2 diabetes mellitus with other skin ulcer: Secondary | ICD-10-CM | POA: Diagnosis not present

## 2018-05-31 DIAGNOSIS — I4891 Unspecified atrial fibrillation: Secondary | ICD-10-CM | POA: Diagnosis not present

## 2018-05-31 DIAGNOSIS — L97521 Non-pressure chronic ulcer of other part of left foot limited to breakdown of skin: Secondary | ICD-10-CM | POA: Diagnosis not present

## 2018-05-31 DIAGNOSIS — Z992 Dependence on renal dialysis: Secondary | ICD-10-CM | POA: Diagnosis not present

## 2018-05-31 DIAGNOSIS — J449 Chronic obstructive pulmonary disease, unspecified: Secondary | ICD-10-CM | POA: Diagnosis not present

## 2018-05-31 DIAGNOSIS — M150 Primary generalized (osteo)arthritis: Secondary | ICD-10-CM | POA: Diagnosis not present

## 2018-05-31 DIAGNOSIS — M329 Systemic lupus erythematosus, unspecified: Secondary | ICD-10-CM | POA: Diagnosis not present

## 2018-05-31 DIAGNOSIS — M109 Gout, unspecified: Secondary | ICD-10-CM | POA: Diagnosis not present

## 2018-05-31 DIAGNOSIS — I872 Venous insufficiency (chronic) (peripheral): Secondary | ICD-10-CM | POA: Diagnosis not present

## 2018-05-31 DIAGNOSIS — E1122 Type 2 diabetes mellitus with diabetic chronic kidney disease: Secondary | ICD-10-CM | POA: Diagnosis not present

## 2018-05-31 DIAGNOSIS — Z794 Long term (current) use of insulin: Secondary | ICD-10-CM | POA: Diagnosis not present

## 2018-05-31 DIAGNOSIS — H409 Unspecified glaucoma: Secondary | ICD-10-CM | POA: Diagnosis not present

## 2018-05-31 DIAGNOSIS — N186 End stage renal disease: Secondary | ICD-10-CM | POA: Diagnosis not present

## 2018-05-31 DIAGNOSIS — L97213 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scle: Secondary | ICD-10-CM | POA: Diagnosis not present

## 2018-05-31 DIAGNOSIS — Z87891 Personal history of nicotine dependence: Secondary | ICD-10-CM | POA: Diagnosis not present

## 2018-06-01 DIAGNOSIS — N186 End stage renal disease: Secondary | ICD-10-CM | POA: Diagnosis not present

## 2018-06-01 DIAGNOSIS — D509 Iron deficiency anemia, unspecified: Secondary | ICD-10-CM | POA: Diagnosis not present

## 2018-06-01 DIAGNOSIS — D689 Coagulation defect, unspecified: Secondary | ICD-10-CM | POA: Diagnosis not present

## 2018-06-01 DIAGNOSIS — D631 Anemia in chronic kidney disease: Secondary | ICD-10-CM | POA: Diagnosis not present

## 2018-06-01 DIAGNOSIS — E876 Hypokalemia: Secondary | ICD-10-CM | POA: Diagnosis not present

## 2018-06-01 DIAGNOSIS — N2581 Secondary hyperparathyroidism of renal origin: Secondary | ICD-10-CM | POA: Diagnosis not present

## 2018-06-02 DIAGNOSIS — E1122 Type 2 diabetes mellitus with diabetic chronic kidney disease: Secondary | ICD-10-CM | POA: Diagnosis not present

## 2018-06-02 DIAGNOSIS — D6861 Antiphospholipid syndrome: Secondary | ICD-10-CM | POA: Diagnosis not present

## 2018-06-02 DIAGNOSIS — Z794 Long term (current) use of insulin: Secondary | ICD-10-CM | POA: Diagnosis not present

## 2018-06-02 DIAGNOSIS — Z992 Dependence on renal dialysis: Secondary | ICD-10-CM | POA: Diagnosis not present

## 2018-06-02 DIAGNOSIS — I4891 Unspecified atrial fibrillation: Secondary | ICD-10-CM | POA: Diagnosis not present

## 2018-06-02 DIAGNOSIS — N186 End stage renal disease: Secondary | ICD-10-CM | POA: Diagnosis not present

## 2018-06-02 DIAGNOSIS — Z87891 Personal history of nicotine dependence: Secondary | ICD-10-CM | POA: Diagnosis not present

## 2018-06-02 DIAGNOSIS — H409 Unspecified glaucoma: Secondary | ICD-10-CM | POA: Diagnosis not present

## 2018-06-02 DIAGNOSIS — J449 Chronic obstructive pulmonary disease, unspecified: Secondary | ICD-10-CM | POA: Diagnosis not present

## 2018-06-02 DIAGNOSIS — E11622 Type 2 diabetes mellitus with other skin ulcer: Secondary | ICD-10-CM | POA: Diagnosis not present

## 2018-06-02 DIAGNOSIS — E785 Hyperlipidemia, unspecified: Secondary | ICD-10-CM | POA: Diagnosis not present

## 2018-06-02 DIAGNOSIS — M150 Primary generalized (osteo)arthritis: Secondary | ICD-10-CM | POA: Diagnosis not present

## 2018-06-02 DIAGNOSIS — L89610 Pressure ulcer of right heel, unstageable: Secondary | ICD-10-CM | POA: Diagnosis not present

## 2018-06-02 DIAGNOSIS — M329 Systemic lupus erythematosus, unspecified: Secondary | ICD-10-CM | POA: Diagnosis not present

## 2018-06-02 DIAGNOSIS — L97213 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scle: Secondary | ICD-10-CM | POA: Diagnosis not present

## 2018-06-02 DIAGNOSIS — L97521 Non-pressure chronic ulcer of other part of left foot limited to breakdown of skin: Secondary | ICD-10-CM | POA: Diagnosis not present

## 2018-06-02 DIAGNOSIS — I872 Venous insufficiency (chronic) (peripheral): Secondary | ICD-10-CM | POA: Diagnosis not present

## 2018-06-02 DIAGNOSIS — M109 Gout, unspecified: Secondary | ICD-10-CM | POA: Diagnosis not present

## 2018-06-03 DIAGNOSIS — E876 Hypokalemia: Secondary | ICD-10-CM | POA: Diagnosis not present

## 2018-06-03 DIAGNOSIS — N186 End stage renal disease: Secondary | ICD-10-CM | POA: Diagnosis not present

## 2018-06-03 DIAGNOSIS — D509 Iron deficiency anemia, unspecified: Secondary | ICD-10-CM | POA: Diagnosis not present

## 2018-06-03 DIAGNOSIS — D631 Anemia in chronic kidney disease: Secondary | ICD-10-CM | POA: Diagnosis not present

## 2018-06-03 DIAGNOSIS — N2581 Secondary hyperparathyroidism of renal origin: Secondary | ICD-10-CM | POA: Diagnosis not present

## 2018-06-03 DIAGNOSIS — D689 Coagulation defect, unspecified: Secondary | ICD-10-CM | POA: Diagnosis not present

## 2018-06-04 ENCOUNTER — Encounter: Payer: Self-pay | Admitting: *Deleted

## 2018-06-04 ENCOUNTER — Encounter: Payer: Self-pay | Admitting: Vascular Surgery

## 2018-06-04 ENCOUNTER — Ambulatory Visit (HOSPITAL_COMMUNITY)
Admission: RE | Admit: 2018-06-04 | Discharge: 2018-06-04 | Disposition: A | Discharge status: Home / Self Care | Payer: Medicare Other | Source: Ambulatory Visit | Attending: Vascular Surgery | Admitting: Vascular Surgery

## 2018-06-04 ENCOUNTER — Other Ambulatory Visit: Payer: Self-pay

## 2018-06-04 ENCOUNTER — Other Ambulatory Visit: Payer: Self-pay | Admitting: *Deleted

## 2018-06-04 ENCOUNTER — Encounter (HOSPITAL_COMMUNITY): Payer: Self-pay

## 2018-06-04 ENCOUNTER — Ambulatory Visit (INDEPENDENT_AMBULATORY_CARE_PROVIDER_SITE_OTHER): Payer: Medicare Other | Admitting: Vascular Surgery

## 2018-06-04 VITALS — BP 123/47 | HR 73 | Temp 97.9°F | Resp 20 | Ht 64.0 in | Wt 175.0 lb

## 2018-06-04 DIAGNOSIS — L97521 Non-pressure chronic ulcer of other part of left foot limited to breakdown of skin: Secondary | ICD-10-CM | POA: Diagnosis not present

## 2018-06-04 DIAGNOSIS — N186 End stage renal disease: Secondary | ICD-10-CM | POA: Diagnosis not present

## 2018-06-04 DIAGNOSIS — L97219 Non-pressure chronic ulcer of right calf with unspecified severity: Secondary | ICD-10-CM

## 2018-06-04 DIAGNOSIS — M150 Primary generalized (osteo)arthritis: Secondary | ICD-10-CM | POA: Diagnosis not present

## 2018-06-04 DIAGNOSIS — I4891 Unspecified atrial fibrillation: Secondary | ICD-10-CM | POA: Diagnosis not present

## 2018-06-04 DIAGNOSIS — J449 Chronic obstructive pulmonary disease, unspecified: Secondary | ICD-10-CM | POA: Diagnosis not present

## 2018-06-04 DIAGNOSIS — E11622 Type 2 diabetes mellitus with other skin ulcer: Secondary | ICD-10-CM | POA: Diagnosis not present

## 2018-06-04 DIAGNOSIS — M109 Gout, unspecified: Secondary | ICD-10-CM | POA: Diagnosis not present

## 2018-06-04 DIAGNOSIS — Z992 Dependence on renal dialysis: Secondary | ICD-10-CM | POA: Diagnosis not present

## 2018-06-04 DIAGNOSIS — Z87891 Personal history of nicotine dependence: Secondary | ICD-10-CM | POA: Diagnosis not present

## 2018-06-04 DIAGNOSIS — L89610 Pressure ulcer of right heel, unstageable: Secondary | ICD-10-CM | POA: Diagnosis not present

## 2018-06-04 DIAGNOSIS — I872 Venous insufficiency (chronic) (peripheral): Secondary | ICD-10-CM | POA: Diagnosis not present

## 2018-06-04 DIAGNOSIS — E785 Hyperlipidemia, unspecified: Secondary | ICD-10-CM | POA: Diagnosis not present

## 2018-06-04 DIAGNOSIS — Z794 Long term (current) use of insulin: Secondary | ICD-10-CM | POA: Diagnosis not present

## 2018-06-04 DIAGNOSIS — E1122 Type 2 diabetes mellitus with diabetic chronic kidney disease: Secondary | ICD-10-CM | POA: Diagnosis not present

## 2018-06-04 DIAGNOSIS — H409 Unspecified glaucoma: Secondary | ICD-10-CM | POA: Diagnosis not present

## 2018-06-04 DIAGNOSIS — L97213 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scle: Secondary | ICD-10-CM | POA: Diagnosis not present

## 2018-06-04 DIAGNOSIS — M329 Systemic lupus erythematosus, unspecified: Secondary | ICD-10-CM | POA: Diagnosis not present

## 2018-06-04 DIAGNOSIS — I739 Peripheral vascular disease, unspecified: Secondary | ICD-10-CM

## 2018-06-04 NOTE — H&P (View-Only) (Signed)
Patient ID: [name], female   DOB: November 28, 1945, 72 y.o.   MRN: [id number]  Reason for Consult: Chronic Kidney Disease (6 wk f/u.  Dialysis duplex. Dialyzes T, Th, Sat. )   Referred by Sharilyn Sites, MD  Subjective:     HPI:  [name] is a 72 y.o. female known to this practice for dialysis access as well as bilateral lower extremity wounds that began earlier this year.  She has had a left upper extremity second stage basilic vein transposed fistula that had evacuation of hematoma back in June.  There now dialyzing her via right IJ tunneled catheter on Tuesday Thursdays and Saturdays.  She does take Eliquis.  She presents today for evaluation of her bilateral lower extrema wounds.  She had angiogram in March without any intervention with mostly two-vessel runoff bilaterally without any flow-limiting stenoses although the right lower extremity did have one small area in the SFA that was not intervened upon.  At this time the wounds are causing her significant pain particularly the right where the wound is progressed up onto the calf and anterior leg.  She does not walk due to pain at this time.  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  She currently has no fevers.  She does have a wound on her left foot as well.  Both of these wounds are wrapped today.  She takes a fentanyl patch with as needed Percocet for pain control.  Past Medical History:  Diagnosis Date  . Anemia   . Arthritis   . Asthma   . Atrial fibrillation ([place])   . Atrial flutter [facility])    TEE/DCCV December 2013 - on Xarelto [facility])  . Bone spur of toe    Right 5th toe  . Chronic diastolic CHF (congestive heart failure) (HCC)    a. EF 50-55% by echo in 2015  . CKD (chronic kidney disease) stage 4, GFR 15-29 ml/min (HCC)    a. s/p fistula placement but not on HD. Followed by Nephrology.   . Colon  polyposis   . COPD (chronic obstructive pulmonary disease) ([place])   . DVT of upper extremity (deep vein thrombosis) ([place])    Right basilic vein, June 5732  . Essential hypertension, benign   . Fractures   . Glaucoma   . SLE (systemic lupus erythematosus) ([place])   . Type 2 diabetes mellitus ([place])   . UTI (lower urinary tract infection)   . Wound of right leg 06/2017   Family History  Problem Relation Age of Onset  . Diabetes Brother   . Diabetes Father   . Hypertension Father   . Hypertension Sister   . Stroke Mother   . Diabetes Sister   . Hypertension Brother   . Colon cancer Neg Hx    Past Surgical History:  Procedure Laterality Date  . ABDOMINAL AORTOGRAM W/LOWER EXTREMITY N/A 12/18/2017   Procedure: ABDOMINAL AORTOGRAM W/LOWER EXTREMITY;  Surgeon: Elam Dutch, MD;  Location: [place] CV LAB;  Service: Cardiovascular;  Laterality: N/A;  bilateral  . ABDOMINAL HYSTERECTOMY  1983  . ANKLE SURGERY  1993  . AV FISTULA PLACEMENT Left 03/27/2014   Procedure: ARTERIOVENOUS (AV) FISTULA CREATION;  Surgeon: Rosetta Posner, MD;  Location: [place];  Service: Vascular;  Laterality: Left;  . BACK SURGERY  1980  . BASCILIC VEIN TRANSPOSITION Left 01/13/2018   Procedure: LEFT BASILIC VEIN TRANSPOSITION  SECOND STAGE;  Surgeon: Elam Dutch, MD;  Location: [place];  Service: Vascular;  Laterality: Left;  . CARDIOVASCULAR STRESS TEST  12/19/2009   no stress induced rhythm abnormalities, ekg negative for ischemia  . CARDIOVERSION  09/16/2012   Procedure: CARDIOVERSION;  Surgeon: Sanda Klein, MD;  Location[facility];  Service: Cardiovascular;  Laterality: N/A;  . CATARACT EXTRACTION    . COLONOSCOPY  09/20/2012   Dr. [name]: multiple tubular adenomas   . COLONOSCOPY  2005   Dr. [name]. Polyps, path unknown   . COLONOSCOPY N/A 07/24/2016   Procedure: COLONOSCOPY;  Surgeon: [name], MD;  [facility];  Service: Endoscopy;  Laterality: N/A;  9:00 am  .  ESOPHAGOGASTRODUODENOSCOPY  09/20/2012   Dr. [name]: duodenal erosion and possible resolving ulcer at the angularis   . ESOPHAGOGASTRODUODENOSCOPY N/A 03/24/2018   Procedure: ESOPHAGOGASTRODUODENOSCOPY (EGD);  Surgeon: [name], MD;  [facility];  Service: Endoscopy;  Laterality: N/A;  12:45pm- moved up to arrive at 8:30am per Tretha Sciara  . EYE SURGERY    . IR FLUORO GUIDE CV LINE RIGHT  11/09/2017  . IR US GUIDE VASC ACCESS RIGHT  11/09/2017  . REVISON OF ARTERIOVENOUS FISTULA Left 03/15/2018   Procedure: REVISION OF Left arm BASILIC VEIN TRANSPOSITION;  Surgeon: Angelia Mould, MD;  Location: [place];  Service: Vascular;  Laterality: Left;  . TEE WITHOUT CARDIOVERSION  09/16/2012   Procedure: TRANSESOPHAGEAL ECHOCARDIOGRAM (TEE);  Surgeon: Sanda Klein, MD;  Location: [facility] ENDOSCOPY;  Service: Cardiovascular;  Laterality: N/A;  . TRANSESOPHAGEAL ECHOCARDIOGRAM WITH CARDIOVERSION  09/16/2012   EF 60-65%, moderate LVH, moderate regurg of the aortic valve, LA moderately dilated    Short Social History:  Social History   Tobacco Use  . Smoking status: Former Smoker    Packs/day: 1.00    Years: 30.00    Pack years: 30.00    Types: Cigarettes    Last attempt to quit: 08/29/2012    Years since quitting: 5.7  . Smokeless tobacco: Never Used  . Tobacco comment: quit 08-2012  Substance Use Topics  . Alcohol use: No    Alcohol/week: 0.0 standard drinks    Allergies  Allergen Reactions  . Ace Inhibitors Cough    Current Outpatient Medications  Medication Sig Dispense Refill  . acetaminophen (TYLENOL) 500 MG tablet Take 1,000 mg by mouth every 6 (six) hours as needed. For pain    . albuterol (ACCUNEB) 0.63 MG/3ML nebulizer solution Take 1 ampule by nebulization every 6 (six) hours as needed for wheezing or shortness of breath.     Marland Kitchen albuterol (PROVENTIL HFA;VENTOLIN HFA) 108 (90 BASE) MCG/ACT inhaler Inhale 2 puffs into the lungs every 6 (six) hours as needed. For  shortness of breath    . allopurinol (ZYLOPRIM) 100 MG tablet Take 100 mg by mouth 3 (three) times a week. On M, W, F.    . Amino Acids-Protein Hydrolys (FEEDING SUPPLEMENT, PRO-STAT SUGAR FREE 64,) LIQD Take 30 mLs by mouth 2 (two) times daily. (Patient taking differently: Take 30 mLs by mouth 3 (three) times daily with meals. ) 900 mL 0  . apixaban (ELIQUIS) 5 MG TABS tablet Take 1 tablet (5 mg total) by mouth 2 (two) times daily. 60 tablet 6  . atorvastatin (LIPITOR) 20 MG tablet Take 20 mg by mouth at bedtime.     . brimonidine-timolol (COMBIGAN) 0.2-0.5 % ophthalmic solution Place 1 drop into the left eye daily.     Marland Kitchen dicyclomine (BENTYL)  10 MG capsule Take 10 mg by mouth 4 (four) times daily -  before meals and at bedtime.    . fentaNYL (DURAGESIC - DOSED MCG/HR) 100 MCG/HR Place 100 mcg onto the skin every 3 (three) days. Change patch every 3 days.    . folic acid (FOLVITE) 1 MG tablet Take 1 mg by mouth daily.    . hydroxychloroquine (PLAQUENIL) 200 MG tablet Take 300 mg by mouth as directed. Take 1 1/2 tablets daily    . Insulin Detemir (LEVEMIR FLEXTOUCH) 100 UNIT/ML Pen Inject 10 Units into the skin every morning.    . insulin lispro (HUMALOG KWIKPEN) 100 UNIT/ML KiwkPen Inject 5 Units into the skin 3 (three) times daily with meals as needed (*Only take if blood sugar levels are over 150).    . metoprolol tartrate (LOPRESSOR) 50 MG tablet Take 50 mg by mouth See admin instructions. Take 1 tablet twice a day, except on Tuesday, Thursday, Saturday, only take 1 tablet daily in the evening    . oxyCODONE-acetaminophen (PERCOCET/ROXICET) 5-325 MG tablet Take 1 tablet by mouth every 6 (six) hours as needed for severe pain. 20 tablet 0  . pantoprazole (PROTONIX) 40 MG tablet Take 1 tablet (40 mg total) by mouth daily. 30 tablet 11  . predniSONE (DELTASONE) 5 MG tablet Take 5 mg by mouth daily with breakfast.    . travoprost, benzalkonium, (TRAVATAN) 0.004 % ophthalmic solution Place 1 drop into  the left eye at bedtime.     No current facility-administered medications for this visit.     Review of Systems  Constitutional: Positive for fatigue.  HENT: HENT negative.  Eyes: Eyes negative.  Respiratory: Positive for shortness of breath.  GI: Gastrointestinal negative.  Musculoskeletal: Positive for leg pain.  Skin: Positive for wound.  Neurological: Neurological negative. Hematologic: Hematologic/lymphatic negative.  Psychiatric: Psychiatric negative.        Objective:  Objective   Vitals:   06/04/18 1518  BP: (!) 123/47  Pulse: 73  Resp: 20  Temp: 97.9 F (36.6 C)  TempSrc: Oral  SpO2: 100%  Weight: 175 lb (79.4 kg)  Height: 5\' 4"  (1.626 m)   Body mass index is 30.04 kg/m.  Physical Exam  Constitutional: She appears well-developed.  HENT:  Head: Normocephalic.  Eyes: Pupils are equal, round, and reactive to light.  Neck: Normal range of motion.  Cardiovascular: Normal rate.  Pulses:      Radial pulses are 2+ on the right side, and 2+ on the left side.  Pulmonary/Chest: Effort normal.  Musculoskeletal: She exhibits no edema.  Bilateral lower extremities have wounds wrapped  Fistula in left upper arm with pulsatility  Psychiatric: She has a normal mood and affect. Her behavior is normal. Judgment and thought content normal.    Data: I have independently interpreted her left upper extremity dialysis duplex which demonstrates flow volume 260 mL/min diameter ranges from 0.39 up to 0.63 cm     Assessment/Plan:     72 year old female with bilateral lower extremity wounds.  At this point the wound on the right leg is so extensive that she is requiring amputation which I discussed with her today.  She will likely need an above-the-knee amputation although we can evaluate the wounds to see if there is any way to fashion a below-knee amputation at the time but I am skeptical that will be possible.  I was unable to fully evaluate the wounds today as she did not  want her dressings taken down secondary to  pain.  She also has a wound in the left foot that is being followed she has no room for vascular intervention when reviewing her angiogram performed in March.  We will proceed with right likely AK versus below-knee amputation in the near future to alleviate her pain.  She is on dialysis Tuesdays and Thursdays and Saturdays and will need this as an inpatient and I discussed that she will likely need rehab as an outpatient as well.  She will need to hold Eliquis for 48 hours prior to the procedure.   At the time of surgery we will admit her to medical service given her multiple medical comorbidities.  She will also need a fistulogram in the future given the fact that this has not matured adequately at this time.  All of the above was discussed with the patient and her husband and they are amenable to proceed.     Waynetta Sandy MD [facility]

## 2018-06-04 NOTE — Progress Notes (Signed)
Patient ID: [name], female   DOB: 08/02/1946, 72 y.o.   MRN: [id number]  Reason for Consult: Chronic Kidney Disease (6 wk f/u.  Dialysis duplex. Dialyzes T, Th, Sat. )   Referred by Sharilyn Sites, MD  Subjective:     HPI:  [name] is a 72 y.o. female known to this practice for dialysis access as well as bilateral lower extremity wounds that began earlier this year.  She has had a left upper extremity second stage basilic vein transposed fistula that had evacuation of hematoma back in June.  There now dialyzing her via right IJ tunneled catheter on Tuesday Thursdays and Saturdays.  She does take Eliquis.  She presents today for evaluation of her bilateral lower extrema wounds.  She had angiogram in March without any intervention with mostly two-vessel runoff bilaterally without any flow-limiting stenoses although the right lower extremity did have one small area in the SFA that was not intervened upon.  At this time the wounds are causing her significant pain particularly the right where the wound is progressed up onto the calf and anterior leg.  She does not walk due to pain at this time.  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  She currently has no fevers.  She does have a wound on her left foot as well.  Both of these wounds are wrapped today.  She takes a fentanyl patch with as needed Percocet for pain control.  Past Medical History:  Diagnosis Date  . Anemia   . Arthritis   . Asthma   . Atrial fibrillation ([place])   . Atrial flutter [facility])    TEE/DCCV December 2013 - on Xarelto [facility])  . Bone spur of toe    Right 5th toe  . Chronic diastolic CHF (congestive heart failure) (HCC)    a. EF 50-55% by echo in 2015  . CKD (chronic kidney disease) stage 4, GFR 15-29 ml/min (HCC)    a. s/p fistula placement but not on HD. Followed by Nephrology.   . Colon  polyposis   . COPD (chronic obstructive pulmonary disease) ([place])   . DVT of upper extremity (deep vein thrombosis) ([place])    Right basilic vein, June 1245  . Essential hypertension, benign   . Fractures   . Glaucoma   . SLE (systemic lupus erythematosus) ([place])   . Type 2 diabetes mellitus ([place])   . UTI (lower urinary tract infection)   . Wound of right leg 06/2017   Family History  Problem Relation Age of Onset  . Diabetes Brother   . Diabetes Father   . Hypertension Father   . Hypertension Sister   . Stroke Mother   . Diabetes Sister   . Hypertension Brother   . Colon cancer Neg Hx    Past Surgical History:  Procedure Laterality Date  . ABDOMINAL AORTOGRAM W/LOWER EXTREMITY N/A 12/18/2017   Procedure: ABDOMINAL AORTOGRAM W/LOWER EXTREMITY;  Surgeon: Elam Dutch, MD;  Location: [place] CV LAB;  Service: Cardiovascular;  Laterality: N/A;  bilateral  . ABDOMINAL HYSTERECTOMY  1983  . ANKLE SURGERY  1993  . AV FISTULA PLACEMENT Left 03/27/2014   Procedure: ARTERIOVENOUS (AV) FISTULA CREATION;  Surgeon: Rosetta Posner, MD;  Location: [place];  Service: Vascular;  Laterality: Left;  . BACK SURGERY  1980  . BASCILIC VEIN TRANSPOSITION Left 01/13/2018   Procedure: LEFT BASILIC VEIN TRANSPOSITION  SECOND STAGE;  Surgeon: Elam Dutch, MD;  Location: [place];  Service: Vascular;  Laterality: Left;  . CARDIOVASCULAR STRESS TEST  12/19/2009   no stress induced rhythm abnormalities, ekg negative for ischemia  . CARDIOVERSION  09/16/2012   Procedure: CARDIOVERSION;  Surgeon: Sanda Klein, MD;  [facility];  Service: Cardiovascular;  Laterality: N/A;  . CATARACT EXTRACTION    . COLONOSCOPY  09/20/2012   Dr. [name]: multiple tubular adenomas   . COLONOSCOPY  2005   Dr. [name]. Polyps, path unknown   . COLONOSCOPY N/A 07/24/2016   Procedure: COLONOSCOPY;  Surgeon: [name], MD;  [facility];  Service: Endoscopy;  Laterality: N/A;  9:00 am  .  ESOPHAGOGASTRODUODENOSCOPY  09/20/2012   Dr. [name]: duodenal erosion and possible resolving ulcer at the angularis   . ESOPHAGOGASTRODUODENOSCOPY N/A 03/24/2018   Procedure: ESOPHAGOGASTRODUODENOSCOPY (EGD);  Surgeon: [name], MD;  [facility];  Service: Endoscopy;  Laterality: N/A;  12:45pm- moved up to arrive at 8:30am per Tretha Sciara  . EYE SURGERY    . IR FLUORO GUIDE CV LINE RIGHT  11/09/2017  . IR US GUIDE VASC ACCESS RIGHT  11/09/2017  . REVISON OF ARTERIOVENOUS FISTULA Left 03/15/2018   Procedure: REVISION OF Left arm BASILIC VEIN TRANSPOSITION;  Surgeon: Angelia Mould, MD;  Location: [place];  Service: Vascular;  Laterality: Left;  . TEE WITHOUT CARDIOVERSION  09/16/2012   Procedure: TRANSESOPHAGEAL ECHOCARDIOGRAM (TEE);  Surgeon: Sanda Klein, MD;  Location: [facility] ENDOSCOPY;  Service: Cardiovascular;  Laterality: N/A;  . TRANSESOPHAGEAL ECHOCARDIOGRAM WITH CARDIOVERSION  09/16/2012   EF 60-65%, moderate LVH, moderate regurg of the aortic valve, LA moderately dilated    Short Social History:  Social History   Tobacco Use  . Smoking status: Former Smoker    Packs/day: 1.00    Years: 30.00    Pack years: 30.00    Types: Cigarettes    Last attempt to quit: 08/29/2012    Years since quitting: 5.7  . Smokeless tobacco: Never Used  . Tobacco comment: quit 08-2012  Substance Use Topics  . Alcohol use: No    Alcohol/week: 0.0 standard drinks    Allergies  Allergen Reactions  . Ace Inhibitors Cough    Current Outpatient Medications  Medication Sig Dispense Refill  . acetaminophen (TYLENOL) 500 MG tablet Take 1,000 mg by mouth every 6 (six) hours as needed. For pain    . albuterol (ACCUNEB) 0.63 MG/3ML nebulizer solution Take 1 ampule by nebulization every 6 (six) hours as needed for wheezing or shortness of breath.     Marland Kitchen albuterol (PROVENTIL HFA;VENTOLIN HFA) 108 (90 BASE) MCG/ACT inhaler Inhale 2 puffs into the lungs every 6 (six) hours as needed. For  shortness of breath    . allopurinol (ZYLOPRIM) 100 MG tablet Take 100 mg by mouth 3 (three) times a week. On M, W, F.    . Amino Acids-Protein Hydrolys (FEEDING SUPPLEMENT, PRO-STAT SUGAR FREE 64,) LIQD Take 30 mLs by mouth 2 (two) times daily. (Patient taking differently: Take 30 mLs by mouth 3 (three) times daily with meals. ) 900 mL 0  . apixaban (ELIQUIS) 5 MG TABS tablet Take 1 tablet (5 mg total) by mouth 2 (two) times daily. 60 tablet 6  . atorvastatin (LIPITOR) 20 MG tablet Take 20 mg by mouth at bedtime.     . brimonidine-timolol (COMBIGAN) 0.2-0.5 % ophthalmic solution Place 1 drop into the left eye daily.     Marland Kitchen dicyclomine (BENTYL)  10 MG capsule Take 10 mg by mouth 4 (four) times daily -  before meals and at bedtime.    . fentaNYL (DURAGESIC - DOSED MCG/HR) 100 MCG/HR Place 100 mcg onto the skin every 3 (three) days. Change patch every 3 days.    . folic acid (FOLVITE) 1 MG tablet Take 1 mg by mouth daily.    . hydroxychloroquine (PLAQUENIL) 200 MG tablet Take 300 mg by mouth as directed. Take 1 1/2 tablets daily    . Insulin Detemir (LEVEMIR FLEXTOUCH) 100 UNIT/ML Pen Inject 10 Units into the skin every morning.    . insulin lispro (HUMALOG KWIKPEN) 100 UNIT/ML KiwkPen Inject 5 Units into the skin 3 (three) times daily with meals as needed (*Only take if blood sugar levels are over 150).    . metoprolol tartrate (LOPRESSOR) 50 MG tablet Take 50 mg by mouth See admin instructions. Take 1 tablet twice a day, except on Tuesday, Thursday, Saturday, only take 1 tablet daily in the evening    . oxyCODONE-acetaminophen (PERCOCET/ROXICET) 5-325 MG tablet Take 1 tablet by mouth every 6 (six) hours as needed for severe pain. 20 tablet 0  . pantoprazole (PROTONIX) 40 MG tablet Take 1 tablet (40 mg total) by mouth daily. 30 tablet 11  . predniSONE (DELTASONE) 5 MG tablet Take 5 mg by mouth daily with breakfast.    . travoprost, benzalkonium, (TRAVATAN) 0.004 % ophthalmic solution Place 1 drop into  the left eye at bedtime.     No current facility-administered medications for this visit.     Review of Systems  Constitutional: Positive for fatigue.  HENT: HENT negative.  Eyes: Eyes negative.  Respiratory: Positive for shortness of breath.  GI: Gastrointestinal negative.  Musculoskeletal: Positive for leg pain.  Skin: Positive for wound.  Neurological: Neurological negative. Hematologic: Hematologic/lymphatic negative.  Psychiatric: Psychiatric negative.        Objective:  Objective   Vitals:   06/04/18 1518  BP: (!) 123/47  Pulse: 73  Resp: 20  Temp: 97.9 F (36.6 C)  TempSrc: Oral  SpO2: 100%  Weight: 175 lb (79.4 kg)  Height: 5\' 4"  (1.626 m)   Body mass index is 30.04 kg/m.  Physical Exam  Constitutional: She appears well-developed.  HENT:  Head: Normocephalic.  Eyes: Pupils are equal, round, and reactive to light.  Neck: Normal range of motion.  Cardiovascular: Normal rate.  Pulses:      Radial pulses are 2+ on the right side, and 2+ on the left side.  Pulmonary/Chest: Effort normal.  Musculoskeletal: She exhibits no edema.  Bilateral lower extremities have wounds wrapped  Fistula in left upper arm with pulsatility  Psychiatric: She has a normal mood and affect. Her behavior is normal. Judgment and thought content normal.    Data: I have independently interpreted her left upper extremity dialysis duplex which demonstrates flow volume 260 mL/min diameter ranges from 0.39 up to 0.63 cm     Assessment/Plan:     72 year old female with bilateral lower extremity wounds.  At this point the wound on the right leg is so extensive that she is requiring amputation which I discussed with her today.  She will likely need an above-the-knee amputation although we can evaluate the wounds to see if there is any way to fashion a below-knee amputation at the time but I am skeptical that will be possible.  I was unable to fully evaluate the wounds today as she did not  want her dressings taken down secondary to  pain.  She also has a wound in the left foot that is being followed she has no room for vascular intervention when reviewing her angiogram performed in March.  We will proceed with right likely AK versus below-knee amputation in the near future to alleviate her pain.  She is on dialysis Tuesdays and Thursdays and Saturdays and will need this as an inpatient and I discussed that she will likely need rehab as an outpatient as well.  She will need to hold Eliquis for 48 hours prior to the procedure.   At the time of surgery we will admit her to medical service given her multiple medical comorbidities.  She will also need a fistulogram in the future given the fact that this has not matured adequately at this time.  All of the above was discussed with the patient and her husband and they are amenable to proceed.     Waynetta Sandy MD [facility]

## 2018-06-05 DIAGNOSIS — D631 Anemia in chronic kidney disease: Secondary | ICD-10-CM | POA: Diagnosis not present

## 2018-06-05 DIAGNOSIS — N2581 Secondary hyperparathyroidism of renal origin: Secondary | ICD-10-CM | POA: Diagnosis not present

## 2018-06-05 DIAGNOSIS — N186 End stage renal disease: Secondary | ICD-10-CM | POA: Diagnosis not present

## 2018-06-05 DIAGNOSIS — E876 Hypokalemia: Secondary | ICD-10-CM | POA: Diagnosis not present

## 2018-06-05 DIAGNOSIS — D689 Coagulation defect, unspecified: Secondary | ICD-10-CM | POA: Diagnosis not present

## 2018-06-05 DIAGNOSIS — D509 Iron deficiency anemia, unspecified: Secondary | ICD-10-CM | POA: Diagnosis not present

## 2018-06-06 DIAGNOSIS — M1990 Unspecified osteoarthritis, unspecified site: Secondary | ICD-10-CM | POA: Diagnosis not present

## 2018-06-06 DIAGNOSIS — I509 Heart failure, unspecified: Secondary | ICD-10-CM | POA: Diagnosis not present

## 2018-06-06 DIAGNOSIS — J449 Chronic obstructive pulmonary disease, unspecified: Secondary | ICD-10-CM | POA: Diagnosis not present

## 2018-06-07 ENCOUNTER — Telehealth: Payer: Self-pay | Admitting: Vascular Surgery

## 2018-06-07 ENCOUNTER — Encounter (HOSPITAL_COMMUNITY): Payer: Self-pay

## 2018-06-07 DIAGNOSIS — L97521 Non-pressure chronic ulcer of other part of left foot limited to breakdown of skin: Secondary | ICD-10-CM | POA: Diagnosis not present

## 2018-06-07 DIAGNOSIS — L97213 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scle: Secondary | ICD-10-CM | POA: Diagnosis not present

## 2018-06-07 DIAGNOSIS — M329 Systemic lupus erythematosus, unspecified: Secondary | ICD-10-CM | POA: Diagnosis not present

## 2018-06-07 DIAGNOSIS — Z992 Dependence on renal dialysis: Secondary | ICD-10-CM | POA: Diagnosis not present

## 2018-06-07 DIAGNOSIS — Z794 Long term (current) use of insulin: Secondary | ICD-10-CM | POA: Diagnosis not present

## 2018-06-07 DIAGNOSIS — N186 End stage renal disease: Secondary | ICD-10-CM | POA: Diagnosis not present

## 2018-06-07 DIAGNOSIS — H409 Unspecified glaucoma: Secondary | ICD-10-CM | POA: Diagnosis not present

## 2018-06-07 DIAGNOSIS — I4891 Unspecified atrial fibrillation: Secondary | ICD-10-CM | POA: Diagnosis not present

## 2018-06-07 DIAGNOSIS — M109 Gout, unspecified: Secondary | ICD-10-CM | POA: Diagnosis not present

## 2018-06-07 DIAGNOSIS — E785 Hyperlipidemia, unspecified: Secondary | ICD-10-CM | POA: Diagnosis not present

## 2018-06-07 DIAGNOSIS — Z87891 Personal history of nicotine dependence: Secondary | ICD-10-CM | POA: Diagnosis not present

## 2018-06-07 DIAGNOSIS — I872 Venous insufficiency (chronic) (peripheral): Secondary | ICD-10-CM | POA: Diagnosis not present

## 2018-06-07 DIAGNOSIS — E11622 Type 2 diabetes mellitus with other skin ulcer: Secondary | ICD-10-CM | POA: Diagnosis not present

## 2018-06-07 DIAGNOSIS — J449 Chronic obstructive pulmonary disease, unspecified: Secondary | ICD-10-CM | POA: Diagnosis not present

## 2018-06-07 DIAGNOSIS — L89610 Pressure ulcer of right heel, unstageable: Secondary | ICD-10-CM | POA: Diagnosis not present

## 2018-06-07 DIAGNOSIS — E1122 Type 2 diabetes mellitus with diabetic chronic kidney disease: Secondary | ICD-10-CM | POA: Diagnosis not present

## 2018-06-07 DIAGNOSIS — M150 Primary generalized (osteo)arthritis: Secondary | ICD-10-CM | POA: Diagnosis not present

## 2018-06-07 NOTE — Progress Notes (Addendum)
Called and spoke with patient's niece [name] and husband [name], per patient's request.  Patient's husband Hildreth Orsak) manages her healthcare but was not home. He called back after original call and I gave him instructions  Advised NPO after midnight.  Medications to take with a sip of water: plaquenil, tylenol-if needed, percocet-if needed, albuterol inhaler/nebulizer-if needed, eye drops, protonix, prednisone, Pt takes metoprolol only  At HS on Tuesdays due to dialysis per husband, so will take tonight but not tomorrow morning  Insulin instructions given- levimir insulin-5 units day of procedure, humalog insulin day of surgery -2 units ONLY IF CBG is greater than 220.  Patient advised to hold Eliquis beginning 06/05/18 by surgeon, she has held medication per niece and husband  Advised to arrive at 0800 at admitting office. History reviewed  Anesthesia advised to review chart with patient's heart history

## 2018-06-07 NOTE — Progress Notes (Signed)
Anesthesia Chart Review: SAME DAY WORK-UP  Case:  [id number] Date/Time:  06/08/18 1015   Procedure:  AMPUTATION BELOW KNEE POSSIBLE ABOVE KNEE (Right )   Anesthesia type:  General   Pre-op diagnosis:  nonviable tissue   [facility] OR ROOM 10 / [place] [facility]   Surgeon:  Elam Dutch, MD      DISCUSSION: Patient is a 72 year old female scheduled for the above procedure.   History includes former smoker (quit '13), chronic diastolic CHF, atrial fibrillation/flutter/PAF (s/p DCCV 09/16/12), HTN, DM2, anemia, asthma, COPD, glaucoma, ESRD (on hemodialysis TTS, started 01/04/13; s/p left basilic vein transposition 01/12/18; as of 06/04/18, right IJ used for HD because AVF not matured adequately and needs fistulogram), SLE, right brachial vein DVT 03/24/14.   Per [facility], patient normal has hemodialysis on TTS and will have in-patient hemodialysis. They advised her to hold Eliquis beginning 06/05/18.   She is a same day work-up. She will get labs on the day of surgery. If labs and patient evaluation stable then I would anticipate that she can proceed as planned.   PROVIDERS: Sharilyn Sites, MD is PCP - Rozann Lesches, MD is cardiologist. Last visit Bernerd Pho, PA-C on 08/05/17. No new medication changes. Corliss Parish, MD is nephrologist (unclear if seeing a different nephrologist now that she has started hemodialysis). Loni Beckwith, MD is endocrinologist. Manus Rudd, MD is GI. EGD 03/24/18 showed mild erosive esophagitis, erosive gastropathy and enteropathy which could be contributing to her anemia, bleeding, and abdominal discomfort. Stomach biopsy showed mild chronic gastritis with intestinal metaplasia and hyperplastic changes, no H. Pylori.  Mardella Layman, MD is dermatologist ([place]). Patient was referred to help determine etiology of RLE wounds. (No significant arterial disease of the RLE by PV-gram and vascular did not suspect venous stasis ulcer.). DDX  according to 04/28/18 note, "DDX includes Lupus vasculitis vs APLA (antibodies positive but on eliquis so possible false positive) vs. Arterial insufficiency associated vasculopathy induced ulcerations vs. calciphylaxis (disfavor given lower extremity distribution and progression despite starting hemodialysis) vs. Less likely pyoderma gangrenosum given lack of rapid spread and lack of undermined border vs. less likely infectious given time-course (though likely had secondary infection given improvement with antibiotics). Unlikely cryoglobulinemic vasculitis given negative serologies." Punch biopsies outlined below. She is being referred to hematology for APLA positivity, although "may be false positive while on eliquis, esp because patient denies any known miscarriages or significant bleeding episodes in the past." (Last saw Twana First, MD with [facility] 08/26/17.)   LABS: She is for labs on the day of surgery. A1c 6.l2 on 05/05/18.   Labs at [place]: 04/09/18: CBC: H/H 11.5/37.0, PLT 250. CHEM: AST 17, ALT 10, BUN 47, Cr 4.39  DRVVT, Lupus anticoagulant > 180.0 (H). FANA Staining Patterns: Homogeneous Pattern 1:1280 (H)  dsDNA Ab  1 (0-9 IU/mL)  Extractable Nuclear Antigen:  Anti-RNP Antibody  >8.0 (H)   Anti-Smith Antibody 0.0 - 0.9 AI <0.2 (0-0.9 AI) Concern for antiphospholipid antibody syndrome from a clinical standpoint although patient on Eliquis  04/28/18 punch biopsies: A. Skin, right shin, punch B. Skin, left 1st toes, punch A: Right shin, punch -Pauci-inflammatory skin with small vessel ectasia. -Negative for convincing evidence of vasculitis or vasculopathy. -Negative for significant calcification on limited sampling of subcutis. -See comment.  B: Left 1st toe, punch -Dermatophytosis with numerous fungal hyphae in the stratum corneum. -Postinflammatory skin with small vessel ectasia. -Solitary congested small vessel, not  diagnostic of thrombotic  vasculopathy.  -Negative for evidence of vasculitis.  -Negative for significant calcification on limited sampling of subcutis. -See comment.   Comment These findings are nonspecific. There is likely a component of venous insufficiency/venous stasis. There is no definitive evidence of vasculitis or vasculopathy; the focal congested small vessel observed is nonspecific in isolation. There is also no definitive evidence of calciphylaxis, however, deeper biopsy with ample sampling of the subcutis is often necessary to make that diagnosis if there is high clinical concern. The dermatophytosis observed in the second biopsy is likely an exacerbating factor but unlikely to be solely responsible for the patient's clinical findings. Pyoderma gangrenosum does not have specific histological findings although active disease is classically neutrophil rich. Clinical correlation, including correlation with tissue cultures, is recommended.    IMAGES: CXR 03/01/18: IMPRESSION: No acute disease in the chest.  Chronic cardiomegaly. Aortic Atherosclerosis (ICD10-I70.0).   EKG: EKG 11/02/17: Sinus tachycardia (100). Abnormal R-wave progression, late transition. Probable LVH.    CV: Abdominal aortogram with BLE runoff 12/18/17: Operative findings: #1 essentially patent right lower extremity arterial tree with focal less than 1 mm length stenosis of right superficial femoral artery three-vessel runoff right foot #2 similar findings left leg with two-vessel runoff Operative management: No vascular etiology found for her right calf wound. This is not a venous stasis ulcer or an ulcer that is arterial in nature.  I am highly suspicious that this ulcer more likely represents calciphylaxis or some other unknown etiology.  Would recommend biopsy of this lesion to rule out calciphylaxis.   Echo 11/03/17:  Study Conclusions: - Left ventricle: The cavity size was normal. Wall thickness was increased in a pattern of moderate LVH.  Systolic function was normal. The estimated ejection fraction was in the range of 55% to 60%. Wall motion was normal; there were no regional wall motion abnormalities. Features are consistent with a pseudonormalleft ventricular filling pattern, with concomitant abnormalrelaxation and increased filling pressure (grade 2 diastolicdysfunction). Doppler parameters are consistent with highventricular filling pressure. - Aortic valve: Moderately calcified annulus. Trileaflet; mildly thickened, moderately calcified leaflets. Sclerosis without stenosis. There was moderate regurgitation. - Mitral valve: Calcified annulus. Moderately calcified leaflets. There was moderate regurgitation. - Left atrium: The atrium was severely dilated. - Right ventricle: Systolic function was moderately reduced. - Right atrium: The atrium was moderately dilated. - Tricuspid valve: Mildly thickened leaflets. There was moderate regurgitation. - Pulmonary arteries: PA peak pressure: 63 mm Hg (S). - Inferior vena cava: The vessel was dilated. The respirophasic diameter changes were blunted (<50%), consistent with elevated central venous pressure. Estimated CVP 15 mmHg.  Carotid duplex 02/21/14:  Impression: Minimal amount of bilateral atherosclerotic plaque, not resulting in a hemodynamically significant stenosis.  Nuclear stress test 12/19/09:  Impression: - Normal left ventricular ejection fraction of 57%. - Normal left ventricular wall motion. - Probable breast attenuation artifact at anteroseptal wall left ventricle. - No other definite myocardial perfusion defects are identified.   Past Medical History:  Diagnosis Date  . Anemia   . Arthritis   . Asthma   . Atrial fibrillation ([place])   . Atrial flutter [facility])    TEE/DCCV December 2013 - on Xarelto [facility])  . Bone spur of toe    Right 5th toe  . Chronic diastolic CHF (congestive heart failure) (HCC)    a. EF 50-55% by echo in 2015  . CKD (chronic kidney  disease) stage 4, GFR 15-29 ml/min (HCC)    a. s/p fistula placement but  not on HD. Followed by Nephrology.   . Colon polyposis   . COPD (chronic obstructive pulmonary disease) ([place])   . DVT of upper extremity (deep vein thrombosis) ([place])    Right basilic vein, June 9326  . Essential hypertension, benign   . Fractures   . Glaucoma   . SLE (systemic lupus erythematosus) ([place])   . Type 2 diabetes mellitus ([place])   . UTI (lower urinary tract infection)   . Wound of right leg 06/2017    Past Surgical History:  Procedure Laterality Date  . ABDOMINAL AORTOGRAM W/LOWER EXTREMITY N/A 12/18/2017   Procedure: ABDOMINAL AORTOGRAM W/LOWER EXTREMITY;  Surgeon: Elam Dutch, MD;  Location: [place] CV LAB;  Service: Cardiovascular;  Laterality: N/A;  bilateral  . ABDOMINAL HYSTERECTOMY  1983  . ANKLE SURGERY  1993  . AV FISTULA PLACEMENT Left 03/27/2014   Procedure: ARTERIOVENOUS (AV) FISTULA CREATION;  Surgeon: Rosetta Posner, MD;  Location: [place];  Service: Vascular;  Laterality: Left;  . BACK SURGERY  1980  . BASCILIC VEIN TRANSPOSITION Left 01/13/2018   Procedure: LEFT BASILIC VEIN TRANSPOSITION SECOND STAGE;  Surgeon: Elam Dutch, MD;  Location: [place];  Service: Vascular;  Laterality: Left;  . CARDIOVASCULAR STRESS TEST  12/19/2009   no stress induced rhythm abnormalities, ekg negative for ischemia  . CARDIOVERSION  09/16/2012   Procedure: CARDIOVERSION;  Surgeon: Sanda Klein, MD;  [facility];  Service: Cardiovascular;  Laterality: N/A;  . CATARACT EXTRACTION    . COLONOSCOPY  09/20/2012   Dr. [name]: multiple tubular adenomas   . COLONOSCOPY  2005   Dr. [name]. Polyps, path unknown   . COLONOSCOPY N/A 07/24/2016   Procedure: COLONOSCOPY;  Surgeon: [name], MD;  [facility];  Service: Endoscopy;  Laterality: N/A;  9:00 am  . ESOPHAGOGASTRODUODENOSCOPY  09/20/2012   Dr. [name]: duodenal erosion and possible resolving ulcer at the angularis   .  ESOPHAGOGASTRODUODENOSCOPY N/A 03/24/2018   Procedure: ESOPHAGOGASTRODUODENOSCOPY (EGD);  Surgeon: [name], MD;  [facility];  Service: Endoscopy;  Laterality: N/A;  12:45pm- moved up to arrive at 8:30am per Tretha Sciara  . EYE SURGERY    . IR FLUORO GUIDE CV LINE RIGHT  11/09/2017  . IR US GUIDE VASC ACCESS RIGHT  11/09/2017  . REVISON OF ARTERIOVENOUS FISTULA Left 03/15/2018   Procedure: REVISION OF Left arm BASILIC VEIN TRANSPOSITION;  Surgeon: Angelia Mould, MD;  Location: [place];  Service: Vascular;  Laterality: Left;  . TEE WITHOUT CARDIOVERSION  09/16/2012   Procedure: TRANSESOPHAGEAL ECHOCARDIOGRAM (TEE);  Surgeon: Sanda Klein, MD;  Location: [facility] ENDOSCOPY;  Service: Cardiovascular;  Laterality: N/A;  . TRANSESOPHAGEAL ECHOCARDIOGRAM WITH CARDIOVERSION  09/16/2012   EF 60-65%, moderate LVH, moderate regurg of the aortic valve, LA moderately dilated    MEDICATIONS: No current facility-administered medications for this encounter.    Marland Kitchen acetaminophen (TYLENOL) 500 MG tablet  . albuterol (ACCUNEB) 0.63 MG/3ML nebulizer solution  . albuterol (PROVENTIL HFA;VENTOLIN HFA) 108 (90 BASE) MCG/ACT inhaler  . allopurinol (ZYLOPRIM) 100 MG tablet  . Amino Acids-Protein Hydrolys (FEEDING SUPPLEMENT, PRO-STAT SUGAR FREE 64,) LIQD  . apixaban (ELIQUIS) 5 MG TABS tablet  . atorvastatin (LIPITOR) 20 MG tablet  . brimonidine-timolol (COMBIGAN) 0.2-0.5 % ophthalmic solution  . dicyclomine (BENTYL) 10 MG capsule  . fentaNYL (DURAGESIC - DOSED MCG/HR) 50 MCG/HR  . folic acid (FOLVITE) 1 MG tablet  . folic acid-vitamin b complex-vitamin c-selenium-zinc (DIALYVITE) 3 MG TABS tablet  . hydroxychloroquine (  PLAQUENIL) 200 MG tablet  . Insulin Detemir (LEVEMIR FLEXTOUCH) 100 UNIT/ML Pen  . insulin lispro (HUMALOG KWIKPEN) 100 UNIT/ML KiwkPen  . metoprolol tartrate (LOPRESSOR) 50 MG tablet  . oxyCODONE-acetaminophen (PERCOCET/ROXICET) 5-325 MG tablet  . pantoprazole (PROTONIX) 40 MG  tablet  . predniSONE (DELTASONE) 5 MG tablet  . travoprost, benzalkonium, (TRAVATAN) 0.004 % ophthalmic solution    [facility] Short Stay Center/Anesthesiology Phone [phone] 06/07/2018 2:30 PM

## 2018-06-08 ENCOUNTER — Inpatient Hospital Stay (HOSPITAL_COMMUNITY): Payer: Medicare Other | Admitting: Vascular Surgery

## 2018-06-08 ENCOUNTER — Inpatient Hospital Stay (HOSPITAL_COMMUNITY)
Admission: RE | Admit: 2018-06-08 | Discharge: 2018-06-10 | DRG: 239 | Disposition: A | Discharge status: Rehabilitation Facility | Payer: Medicare Other | Attending: Vascular Surgery | Admitting: Vascular Surgery

## 2018-06-08 ENCOUNTER — Encounter (HOSPITAL_COMMUNITY)
Admission: RE | Disposition: A | Discharge status: Rehabilitation Facility | Payer: Self-pay | Source: Home / Self Care | Attending: Vascular Surgery

## 2018-06-08 DIAGNOSIS — E1122 Type 2 diabetes mellitus with diabetic chronic kidney disease: Secondary | ICD-10-CM | POA: Diagnosis not present

## 2018-06-08 DIAGNOSIS — E11319 Type 2 diabetes mellitus with unspecified diabetic retinopathy without macular edema: Secondary | ICD-10-CM

## 2018-06-08 DIAGNOSIS — Z8249 Family history of ischemic heart disease and other diseases of the circulatory system: Secondary | ICD-10-CM | POA: Diagnosis not present

## 2018-06-08 DIAGNOSIS — Z23 Encounter for immunization: Secondary | ICD-10-CM | POA: Diagnosis not present

## 2018-06-08 DIAGNOSIS — E1140 Type 2 diabetes mellitus with diabetic neuropathy, unspecified: Secondary | ICD-10-CM | POA: Diagnosis not present

## 2018-06-08 DIAGNOSIS — E1151 Type 2 diabetes mellitus with diabetic peripheral angiopathy without gangrene: Principal | ICD-10-CM | POA: Diagnosis present

## 2018-06-08 DIAGNOSIS — I5032 Chronic diastolic (congestive) heart failure: Secondary | ICD-10-CM | POA: Diagnosis present

## 2018-06-08 DIAGNOSIS — E11622 Type 2 diabetes mellitus with other skin ulcer: Secondary | ICD-10-CM | POA: Diagnosis not present

## 2018-06-08 DIAGNOSIS — I4891 Unspecified atrial fibrillation: Secondary | ICD-10-CM | POA: Diagnosis not present

## 2018-06-08 DIAGNOSIS — M329 Systemic lupus erythematosus, unspecified: Secondary | ICD-10-CM | POA: Diagnosis not present

## 2018-06-08 DIAGNOSIS — S78111A Complete traumatic amputation at level between right hip and knee, initial encounter: Secondary | ICD-10-CM

## 2018-06-08 DIAGNOSIS — D638 Anemia in other chronic diseases classified elsewhere: Secondary | ICD-10-CM | POA: Diagnosis not present

## 2018-06-08 DIAGNOSIS — D72829 Elevated white blood cell count, unspecified: Secondary | ICD-10-CM | POA: Diagnosis not present

## 2018-06-08 DIAGNOSIS — Z7952 Long term (current) use of systemic steroids: Secondary | ICD-10-CM

## 2018-06-08 DIAGNOSIS — G8918 Other acute postprocedural pain: Secondary | ICD-10-CM

## 2018-06-08 DIAGNOSIS — E1152 Type 2 diabetes mellitus with diabetic peripheral angiopathy with gangrene: Secondary | ICD-10-CM | POA: Diagnosis not present

## 2018-06-08 DIAGNOSIS — Z4781 Encounter for orthopedic aftercare following surgical amputation: Secondary | ICD-10-CM | POA: Diagnosis not present

## 2018-06-08 DIAGNOSIS — R0989 Other specified symptoms and signs involving the circulatory and respiratory systems: Secondary | ICD-10-CM | POA: Diagnosis not present

## 2018-06-08 DIAGNOSIS — Z992 Dependence on renal dialysis: Secondary | ICD-10-CM | POA: Diagnosis not present

## 2018-06-08 DIAGNOSIS — I132 Hypertensive heart and chronic kidney disease with heart failure and with stage 5 chronic kidney disease, or end stage renal disease: Secondary | ICD-10-CM | POA: Diagnosis present

## 2018-06-08 DIAGNOSIS — R739 Hyperglycemia, unspecified: Secondary | ICD-10-CM | POA: Diagnosis not present

## 2018-06-08 DIAGNOSIS — M7989 Other specified soft tissue disorders: Secondary | ICD-10-CM | POA: Diagnosis not present

## 2018-06-08 DIAGNOSIS — Z794 Long term (current) use of insulin: Secondary | ICD-10-CM | POA: Diagnosis not present

## 2018-06-08 DIAGNOSIS — I700 Atherosclerosis of aorta: Secondary | ICD-10-CM | POA: Diagnosis present

## 2018-06-08 DIAGNOSIS — Z79899 Other long term (current) drug therapy: Secondary | ICD-10-CM

## 2018-06-08 DIAGNOSIS — N186 End stage renal disease: Secondary | ICD-10-CM | POA: Diagnosis not present

## 2018-06-08 DIAGNOSIS — F329 Major depressive disorder, single episode, unspecified: Secondary | ICD-10-CM | POA: Diagnosis present

## 2018-06-08 DIAGNOSIS — I120 Hypertensive chronic kidney disease with stage 5 chronic kidney disease or end stage renal disease: Secondary | ICD-10-CM | POA: Diagnosis not present

## 2018-06-08 DIAGNOSIS — E1143 Type 2 diabetes mellitus with diabetic autonomic (poly)neuropathy: Secondary | ICD-10-CM | POA: Diagnosis not present

## 2018-06-08 DIAGNOSIS — R7309 Other abnormal glucose: Secondary | ICD-10-CM | POA: Diagnosis not present

## 2018-06-08 DIAGNOSIS — E889 Metabolic disorder, unspecified: Secondary | ICD-10-CM | POA: Diagnosis present

## 2018-06-08 DIAGNOSIS — Z79891 Long term (current) use of opiate analgesic: Secondary | ICD-10-CM | POA: Diagnosis not present

## 2018-06-08 DIAGNOSIS — J449 Chronic obstructive pulmonary disease, unspecified: Secondary | ICD-10-CM | POA: Diagnosis not present

## 2018-06-08 DIAGNOSIS — Z7901 Long term (current) use of anticoagulants: Secondary | ICD-10-CM

## 2018-06-08 DIAGNOSIS — I471 Supraventricular tachycardia: Secondary | ICD-10-CM | POA: Diagnosis not present

## 2018-06-08 DIAGNOSIS — Z823 Family history of stroke: Secondary | ICD-10-CM

## 2018-06-08 DIAGNOSIS — Z89611 Acquired absence of right leg above knee: Secondary | ICD-10-CM | POA: Diagnosis not present

## 2018-06-08 DIAGNOSIS — I480 Paroxysmal atrial fibrillation: Secondary | ICD-10-CM | POA: Diagnosis not present

## 2018-06-08 DIAGNOSIS — Z993 Dependence on wheelchair: Secondary | ICD-10-CM

## 2018-06-08 DIAGNOSIS — Z7983 Long term (current) use of bisphosphonates: Secondary | ICD-10-CM

## 2018-06-08 DIAGNOSIS — R471 Dysarthria and anarthria: Secondary | ICD-10-CM | POA: Diagnosis present

## 2018-06-08 DIAGNOSIS — K2950 Unspecified chronic gastritis without bleeding: Secondary | ICD-10-CM | POA: Diagnosis present

## 2018-06-08 DIAGNOSIS — I4892 Unspecified atrial flutter: Secondary | ICD-10-CM | POA: Diagnosis present

## 2018-06-08 DIAGNOSIS — Z90710 Acquired absence of both cervix and uterus: Secondary | ICD-10-CM | POA: Diagnosis not present

## 2018-06-08 DIAGNOSIS — Z888 Allergy status to other drugs, medicaments and biological substances status: Secondary | ICD-10-CM

## 2018-06-08 DIAGNOSIS — I70238 Atherosclerosis of native arteries of right leg with ulceration of other part of lower right leg: Secondary | ICD-10-CM | POA: Diagnosis not present

## 2018-06-08 DIAGNOSIS — E1121 Type 2 diabetes mellitus with diabetic nephropathy: Secondary | ICD-10-CM | POA: Diagnosis not present

## 2018-06-08 DIAGNOSIS — Z87891 Personal history of nicotine dependence: Secondary | ICD-10-CM | POA: Diagnosis not present

## 2018-06-08 DIAGNOSIS — L97219 Non-pressure chronic ulcer of right calf with unspecified severity: Secondary | ICD-10-CM | POA: Diagnosis present

## 2018-06-08 DIAGNOSIS — Z833 Family history of diabetes mellitus: Secondary | ICD-10-CM

## 2018-06-08 DIAGNOSIS — Z86718 Personal history of other venous thrombosis and embolism: Secondary | ICD-10-CM | POA: Diagnosis not present

## 2018-06-08 DIAGNOSIS — N2581 Secondary hyperparathyroidism of renal origin: Secondary | ICD-10-CM | POA: Diagnosis present

## 2018-06-08 DIAGNOSIS — M1990 Unspecified osteoarthritis, unspecified site: Secondary | ICD-10-CM | POA: Diagnosis present

## 2018-06-08 DIAGNOSIS — I998 Other disorder of circulatory system: Secondary | ICD-10-CM | POA: Diagnosis not present

## 2018-06-08 DIAGNOSIS — Z8719 Personal history of other diseases of the digestive system: Secondary | ICD-10-CM

## 2018-06-08 DIAGNOSIS — L97913 Non-pressure chronic ulcer of unspecified part of right lower leg with necrosis of muscle: Secondary | ICD-10-CM | POA: Diagnosis not present

## 2018-06-08 DIAGNOSIS — S81809A Unspecified open wound, unspecified lower leg, initial encounter: Secondary | ICD-10-CM | POA: Diagnosis present

## 2018-06-08 DIAGNOSIS — D62 Acute posthemorrhagic anemia: Secondary | ICD-10-CM | POA: Diagnosis not present

## 2018-06-08 DIAGNOSIS — E162 Hypoglycemia, unspecified: Secondary | ICD-10-CM | POA: Diagnosis not present

## 2018-06-08 DIAGNOSIS — R7303 Prediabetes: Secondary | ICD-10-CM | POA: Diagnosis not present

## 2018-06-08 DIAGNOSIS — Z7951 Long term (current) use of inhaled steroids: Secondary | ICD-10-CM

## 2018-06-08 DIAGNOSIS — H409 Unspecified glaucoma: Secondary | ICD-10-CM | POA: Diagnosis not present

## 2018-06-08 DIAGNOSIS — R030 Elevated blood-pressure reading, without diagnosis of hypertension: Secondary | ICD-10-CM | POA: Diagnosis not present

## 2018-06-08 DIAGNOSIS — I169 Hypertensive crisis, unspecified: Secondary | ICD-10-CM | POA: Diagnosis not present

## 2018-06-08 DIAGNOSIS — T380X5A Adverse effect of glucocorticoids and synthetic analogues, initial encounter: Secondary | ICD-10-CM | POA: Diagnosis not present

## 2018-06-08 HISTORY — PX: AMPUTATION: SHX166

## 2018-06-08 LAB — COMPREHENSIVE METABOLIC PANEL
ALBUMIN: 1.8 g/dL — AB (ref 3.5–5.0)
ALT: 13 U/L (ref 0–44)
ANION GAP: 19 — AB (ref 5–15)
AST: 18 U/L (ref 15–41)
Alkaline Phosphatase: 83 U/L (ref 38–126)
BUN: 100 mg/dL — ABNORMAL HIGH (ref 8–23)
CALCIUM: 8.1 mg/dL — AB (ref 8.9–10.3)
CHLORIDE: 95 mmol/L — AB (ref 98–111)
CO2: 21 mmol/L — AB (ref 22–32)
Creatinine, Ser: 5.89 mg/dL — ABNORMAL HIGH (ref 0.44–1.00)
GFR calc non Af Amer: 6 mL/min — ABNORMAL LOW (ref >60)
GFR, EST AFRICAN AMERICAN: 7 mL/min — AB (ref >60)
Glucose, Bld: 118 mg/dL — ABNORMAL HIGH (ref 70–99)
POTASSIUM: 3 mmol/L — AB (ref 3.5–5.1)
SODIUM: 135 mmol/L (ref 135–145)
Total Bilirubin: 0.4 mg/dL (ref 0.3–1.2)
Total Protein: 6.6 g/dL (ref 6.5–8.1)

## 2018-06-08 LAB — CBC
HCT: 29 % — ABNORMAL LOW (ref 36.0–46.0)
Hemoglobin: 8.3 g/dL — ABNORMAL LOW (ref 12.0–15.0)
MCH: 26.6 pg (ref 26.0–34.0)
MCHC: 28.6 g/dL — AB (ref 30.0–36.0)
MCV: 92.9 fL (ref 78.0–100.0)
PLATELETS: 326 10*3/uL (ref 150–400)
RBC: 3.12 MIL/uL — ABNORMAL LOW (ref 3.87–5.11)
RDW: 17.8 % — AB (ref 11.5–15.5)
WBC: 13.1 10*3/uL — ABNORMAL HIGH (ref 4.0–10.5)

## 2018-06-08 LAB — PROTIME-INR
INR: 1.18
Prothrombin Time: 14.9 seconds (ref 11.4–15.2)

## 2018-06-08 LAB — APTT: APTT: 39 s — AB (ref 24–36)

## 2018-06-08 LAB — GLUCOSE, CAPILLARY
GLUCOSE-CAPILLARY: 100 mg/dL — AB (ref 70–99)
Glucose-Capillary: 124 mg/dL — ABNORMAL HIGH (ref 70–99)
Glucose-Capillary: 104 mg/dL — ABNORMAL HIGH (ref 70–99)

## 2018-06-08 SURGERY — AMPUTATION BELOW KNEE
Anesthesia: General | Laterality: Right

## 2018-06-08 MED ORDER — POLYETHYLENE GLYCOL 3350 17 G PO PACK: 17.0000 g | PACK | Freq: Every day | ORAL | Status: DC | PRN

## 2018-06-08 MED ORDER — MIDAZOLAM HCL 2 MG/2ML IJ SOLN
INTRAMUSCULAR | Status: AC
  Filled 2018-06-08: qty 2

## 2018-06-08 MED ORDER — APIXABAN 5 MG PO TABS
5.0000 mg | ORAL_TABLET | Freq: Two times a day (BID) | ORAL | Status: DC
  Administered 2018-06-09 – 2018-06-10 (×3): 5 mg via ORAL
  Filled 2018-06-08 (×3): qty 1

## 2018-06-08 MED ORDER — 0.9 % SODIUM CHLORIDE (POUR BTL) OPTIME
TOPICAL | Status: DC | PRN
  Administered 2018-06-08: 1000 mL

## 2018-06-08 MED ORDER — INSULIN ASPART 100 UNIT/ML ~~LOC~~ SOLN
0.0000 [IU] | Freq: Three times a day (TID) | SUBCUTANEOUS | Status: DC
  Administered 2018-06-09: 5 [IU] via SUBCUTANEOUS
  Administered 2018-06-09: 2 [IU] via SUBCUTANEOUS
  Administered 2018-06-09 – 2018-06-10 (×2): 3 [IU] via SUBCUTANEOUS

## 2018-06-08 MED ORDER — METOPROLOL TARTRATE 50 MG PO TABS
50.0000 mg | ORAL_TABLET | ORAL | Status: DC
  Administered 2018-06-08: 50 mg via ORAL

## 2018-06-08 MED ORDER — DARBEPOETIN ALFA 200 MCG/0.4ML IJ SOSY: 200.0000 ug | PREFILLED_SYRINGE | INTRAMUSCULAR | Status: DC

## 2018-06-08 MED ORDER — BRIMONIDINE TARTRATE 0.2 % OP SOLN
1.0000 [drp] | Freq: Two times a day (BID) | OPHTHALMIC | Status: DC
  Administered 2018-06-08 – 2018-06-10 (×4): 1 [drp] via OPHTHALMIC
  Filled 2018-06-08: qty 5

## 2018-06-08 MED ORDER — TIMOLOL MALEATE 0.5 % OP SOLN
1.0000 [drp] | Freq: Two times a day (BID) | OPHTHALMIC | Status: DC
  Administered 2018-06-08 – 2018-06-10 (×4): 1 [drp] via OPHTHALMIC
  Filled 2018-06-08: qty 5

## 2018-06-08 MED ORDER — KETAMINE HCL 50 MG/5ML IJ SOSY
PREFILLED_SYRINGE | INTRAMUSCULAR | Status: AC
  Filled 2018-06-08: qty 5

## 2018-06-08 MED ORDER — ALBUTEROL SULFATE HFA 108 (90 BASE) MCG/ACT IN AERS: 2.0000 | INHALATION_SPRAY | Freq: Four times a day (QID) | RESPIRATORY_TRACT | Status: DC | PRN

## 2018-06-08 MED ORDER — HYDROXYCHLOROQUINE SULFATE 200 MG PO TABS
200.0000 mg | ORAL_TABLET | Freq: Every day | ORAL | Status: DC
  Administered 2018-06-09 – 2018-06-10 (×2): 200 mg via ORAL
  Filled 2018-06-08 (×2): qty 1

## 2018-06-08 MED ORDER — HYDRALAZINE HCL 20 MG/ML IJ SOLN: 5.0000 mg | INTRAMUSCULAR | Status: DC | PRN

## 2018-06-08 MED ORDER — PREDNISONE 5 MG PO TABS
5.0000 mg | ORAL_TABLET | Freq: Every day | ORAL | Status: DC
  Administered 2018-06-09 – 2018-06-10 (×2): 5 mg via ORAL
  Filled 2018-06-08 (×2): qty 1

## 2018-06-08 MED ORDER — DOCUSATE SODIUM 100 MG PO CAPS
100.0000 mg | ORAL_CAPSULE | Freq: Every day | ORAL | Status: DC
  Administered 2018-06-09 – 2018-06-10 (×2): 100 mg via ORAL
  Filled 2018-06-08 (×2): qty 1

## 2018-06-08 MED ORDER — PHENYLEPHRINE 40 MCG/ML (10ML) SYRINGE FOR IV PUSH (FOR BLOOD PRESSURE SUPPORT)
PREFILLED_SYRINGE | INTRAVENOUS | Status: DC | PRN
  Administered 2018-06-08: 80 ug via INTRAVENOUS

## 2018-06-08 MED ORDER — METOPROLOL TARTRATE 50 MG PO TABS
ORAL_TABLET | ORAL | Status: AC
  Administered 2018-06-09: 50 mg via ORAL
  Filled 2018-06-08: qty 1

## 2018-06-08 MED ORDER — INSULIN LISPRO 100 UNIT/ML (KWIKPEN): 5.0000 [IU] | PEN_INJECTOR | Freq: Three times a day (TID) | SUBCUTANEOUS | Status: DC | PRN

## 2018-06-08 MED ORDER — PROMETHAZINE HCL 25 MG/ML IJ SOLN: 6.2500 mg | INTRAMUSCULAR | Status: DC | PRN

## 2018-06-08 MED ORDER — ACETAMINOPHEN 325 MG PO TABS: 325.0000 mg | ORAL_TABLET | ORAL | Status: DC | PRN

## 2018-06-08 MED ORDER — PENTAFLUOROPROP-TETRAFLUOROETH EX AERO: 1.0000 "application " | INHALATION_SPRAY | CUTANEOUS | Status: DC | PRN

## 2018-06-08 MED ORDER — ACETAMINOPHEN 325 MG RE SUPP: 325.0000 mg | RECTAL | Status: DC | PRN

## 2018-06-08 MED ORDER — METOPROLOL TARTRATE 5 MG/5ML IV SOLN: 2.0000 mg | INTRAVENOUS | Status: DC | PRN

## 2018-06-08 MED ORDER — HEPARIN SODIUM (PORCINE) 1000 UNIT/ML DIALYSIS: 1000.0000 [IU] | INTRAMUSCULAR | Status: DC | PRN

## 2018-06-08 MED ORDER — DICYCLOMINE HCL 10 MG PO CAPS
10.0000 mg | ORAL_CAPSULE | Freq: Three times a day (TID) | ORAL | Status: DC
  Administered 2018-06-08 – 2018-06-10 (×7): 10 mg via ORAL
  Filled 2018-06-08 (×9): qty 1

## 2018-06-08 MED ORDER — KETAMINE HCL 10 MG/ML IJ SOLN
INTRAMUSCULAR | Status: DC | PRN
  Administered 2018-06-08: 20 mg via INTRAVENOUS

## 2018-06-08 MED ORDER — LIDOCAINE 2% (20 MG/ML) 5 ML SYRINGE
INTRAMUSCULAR | Status: AC
  Filled 2018-06-08: qty 5

## 2018-06-08 MED ORDER — PHENOL 1.4 % MT LIQD: 1.0000 | OROMUCOSAL | Status: DC | PRN

## 2018-06-08 MED ORDER — ONDANSETRON HCL 4 MG/2ML IJ SOLN: 4.0000 mg | Freq: Four times a day (QID) | INTRAMUSCULAR | Status: DC | PRN

## 2018-06-08 MED ORDER — MORPHINE SULFATE (PF) 2 MG/ML IV SOLN
2.0000 mg | INTRAVENOUS | Status: DC | PRN
  Administered 2018-06-08 – 2018-06-09 (×3): 2 mg via INTRAVENOUS
  Filled 2018-06-08 (×3): qty 1

## 2018-06-08 MED ORDER — TRAVOPROST 0.004 % OP SOLN: 1.0000 [drp] | Freq: Every day | OPHTHALMIC | Status: DC

## 2018-06-08 MED ORDER — CEFAZOLIN SODIUM-DEXTROSE 1-4 GM/50ML-% IV SOLN
1.0000 g | INTRAVENOUS | Status: DC
  Administered 2018-06-09: 1 g via INTRAVENOUS
  Filled 2018-06-08 (×2): qty 50

## 2018-06-08 MED ORDER — ONDANSETRON HCL 4 MG/2ML IJ SOLN
INTRAMUSCULAR | Status: DC | PRN
  Administered 2018-06-08: 4 mg via INTRAVENOUS

## 2018-06-08 MED ORDER — BRIMONIDINE TARTRATE-TIMOLOL 0.2-0.5 % OP SOLN: 1.0000 [drp] | Freq: Two times a day (BID) | OPHTHALMIC | Status: DC

## 2018-06-08 MED ORDER — MIDAZOLAM HCL 2 MG/2ML IJ SOLN
INTRAMUSCULAR | Status: DC | PRN
  Administered 2018-06-08: 1 mg via INTRAVENOUS

## 2018-06-08 MED ORDER — SODIUM CHLORIDE 0.9 % IV SOLN
INTRAVENOUS | Status: DC
  Administered 2018-06-08: 10 mL/h via INTRAVENOUS

## 2018-06-08 MED ORDER — FENTANYL CITRATE (PF) 100 MCG/2ML IJ SOLN: 25.0000 ug | INTRAMUSCULAR | Status: DC | PRN

## 2018-06-08 MED ORDER — METOPROLOL TARTRATE 50 MG PO TABS
50.0000 mg | ORAL_TABLET | Freq: Once | ORAL | Status: AC
  Administered 2018-06-08: 50 mg via ORAL

## 2018-06-08 MED ORDER — SODIUM CHLORIDE 0.9 % IV SOLN: 100.0000 mL | INTRAVENOUS | Status: DC | PRN

## 2018-06-08 MED ORDER — METOPROLOL TARTRATE 50 MG PO TABS: 50.0000 mg | ORAL_TABLET | ORAL | Status: DC

## 2018-06-08 MED ORDER — DIALYVITE 3000 3 MG PO TABS: 1.0000 | ORAL_TABLET | Freq: Every day | ORAL | Status: DC

## 2018-06-08 MED ORDER — FOLIC ACID 1 MG PO TABS
1.0000 mg | ORAL_TABLET | Freq: Every day | ORAL | Status: DC
  Administered 2018-06-08 – 2018-06-10 (×3): 1 mg via ORAL
  Filled 2018-06-08 (×3): qty 1

## 2018-06-08 MED ORDER — FENTANYL 25 MCG/HR TD PT72: 50.0000 ug | MEDICATED_PATCH | TRANSDERMAL | Status: DC

## 2018-06-08 MED ORDER — FENTANYL CITRATE (PF) 250 MCG/5ML IJ SOLN
INTRAMUSCULAR | Status: AC
  Filled 2018-06-08: qty 5

## 2018-06-08 MED ORDER — ALBUTEROL SULFATE (2.5 MG/3ML) 0.083% IN NEBU: 2.5000 mg | INHALATION_SOLUTION | Freq: Four times a day (QID) | RESPIRATORY_TRACT | Status: DC | PRN

## 2018-06-08 MED ORDER — LIDOCAINE-PRILOCAINE 2.5-2.5 % EX CREA: 1.0000 "application " | TOPICAL_CREAM | CUTANEOUS | Status: DC | PRN

## 2018-06-08 MED ORDER — MIDAZOLAM HCL 2 MG/2ML IJ SOLN: 0.5000 mg | Freq: Once | INTRAMUSCULAR | Status: DC | PRN

## 2018-06-08 MED ORDER — LATANOPROST 0.005 % OP SOLN
1.0000 [drp] | Freq: Every day | OPHTHALMIC | Status: DC
  Administered 2018-06-08 – 2018-06-09 (×2): 1 [drp] via OPHTHALMIC
  Filled 2018-06-08: qty 2.5

## 2018-06-08 MED ORDER — MAGNESIUM SULFATE 2 GM/50ML IV SOLN: 2.0000 g | Freq: Every day | INTRAVENOUS | Status: DC | PRN

## 2018-06-08 MED ORDER — DIPHENHYDRAMINE HCL 50 MG/ML IJ SOLN
INTRAMUSCULAR | Status: DC | PRN
  Administered 2018-06-08: 12.5 mg via INTRAVENOUS

## 2018-06-08 MED ORDER — ALUM & MAG HYDROXIDE-SIMETH 200-200-20 MG/5ML PO SUSP: 15.0000 mL | ORAL | Status: DC | PRN

## 2018-06-08 MED ORDER — CEFAZOLIN SODIUM-DEXTROSE 2-4 GM/100ML-% IV SOLN
2.0000 g | INTRAVENOUS | Status: AC
  Administered 2018-06-08: 2 g via INTRAVENOUS
  Filled 2018-06-08: qty 100

## 2018-06-08 MED ORDER — DEXAMETHASONE SODIUM PHOSPHATE 10 MG/ML IJ SOLN
INTRAMUSCULAR | Status: DC | PRN
  Administered 2018-06-08: 5 mg via INTRAVENOUS

## 2018-06-08 MED ORDER — FENTANYL CITRATE (PF) 250 MCG/5ML IJ SOLN
INTRAMUSCULAR | Status: DC | PRN
  Administered 2018-06-08 (×4): 50 ug via INTRAVENOUS

## 2018-06-08 MED ORDER — ATORVASTATIN CALCIUM 20 MG PO TABS
20.0000 mg | ORAL_TABLET | Freq: Every day | ORAL | Status: DC
  Administered 2018-06-08 – 2018-06-09 (×2): 20 mg via ORAL
  Filled 2018-06-08 (×2): qty 1

## 2018-06-08 MED ORDER — ONDANSETRON HCL 4 MG/2ML IJ SOLN
INTRAMUSCULAR | Status: AC
  Filled 2018-06-08: qty 2

## 2018-06-08 MED ORDER — METOPROLOL TARTRATE 50 MG PO TABS
50.0000 mg | ORAL_TABLET | ORAL | Status: DC
  Administered 2018-06-09 (×2): 50 mg via ORAL
  Filled 2018-06-08 (×3): qty 1

## 2018-06-08 MED ORDER — PROPOFOL 10 MG/ML IV BOLUS
INTRAVENOUS | Status: AC
  Filled 2018-06-08: qty 20

## 2018-06-08 MED ORDER — CHLORHEXIDINE GLUCONATE CLOTH 2 % EX PADS: 6.0000 | MEDICATED_PAD | Freq: Once | CUTANEOUS | Status: DC

## 2018-06-08 MED ORDER — GUAIFENESIN-DM 100-10 MG/5ML PO SYRP: 15.0000 mL | ORAL_SOLUTION | ORAL | Status: DC | PRN

## 2018-06-08 MED ORDER — DEXAMETHASONE SODIUM PHOSPHATE 10 MG/ML IJ SOLN
INTRAMUSCULAR | Status: AC
  Filled 2018-06-08: qty 1

## 2018-06-08 MED ORDER — LIDOCAINE 2% (20 MG/ML) 5 ML SYRINGE
INTRAMUSCULAR | Status: DC | PRN
  Administered 2018-06-08: 40 mg via INTRAVENOUS

## 2018-06-08 MED ORDER — PROPOFOL 10 MG/ML IV BOLUS
INTRAVENOUS | Status: DC | PRN
  Administered 2018-06-08: 130 mg via INTRAVENOUS

## 2018-06-08 MED ORDER — NEPRO/CARBSTEADY PO LIQD: 237.0000 mL | Freq: Three times a day (TID) | ORAL | Status: DC

## 2018-06-08 MED ORDER — OXYCODONE-ACETAMINOPHEN 5-325 MG PO TABS
1.0000 | ORAL_TABLET | Freq: Four times a day (QID) | ORAL | Status: DC | PRN
  Administered 2018-06-08 – 2018-06-10 (×6): 1 via ORAL
  Filled 2018-06-08 (×6): qty 1

## 2018-06-08 MED ORDER — ALLOPURINOL 100 MG PO TABS
100.0000 mg | ORAL_TABLET | ORAL | Status: DC
  Administered 2018-06-09: 100 mg via ORAL
  Filled 2018-06-08: qty 1

## 2018-06-08 MED ORDER — LABETALOL HCL 5 MG/ML IV SOLN: 10.0000 mg | INTRAVENOUS | Status: DC | PRN

## 2018-06-08 MED ORDER — PRO-STAT SUGAR FREE PO LIQD
30.0000 mL | Freq: Two times a day (BID) | ORAL | Status: DC
  Administered 2018-06-09 – 2018-06-10 (×2): 30 mL via ORAL
  Filled 2018-06-08 (×3): qty 30

## 2018-06-08 MED ORDER — PANTOPRAZOLE SODIUM 40 MG PO TBEC
40.0000 mg | DELAYED_RELEASE_TABLET | Freq: Every day | ORAL | Status: DC
  Administered 2018-06-09 – 2018-06-10 (×2): 40 mg via ORAL
  Filled 2018-06-08 (×2): qty 1

## 2018-06-08 MED ORDER — CEFAZOLIN SODIUM-DEXTROSE 2-4 GM/100ML-% IV SOLN: 2.0000 g | Freq: Three times a day (TID) | INTRAVENOUS | Status: DC

## 2018-06-08 MED ORDER — RENA-VITE PO TABS
1.0000 | ORAL_TABLET | Freq: Every day | ORAL | Status: DC
  Administered 2018-06-08 – 2018-06-09 (×2): 1 via ORAL
  Filled 2018-06-08 (×2): qty 1

## 2018-06-08 MED ORDER — SUCCINYLCHOLINE CHLORIDE 200 MG/10ML IV SOSY
PREFILLED_SYRINGE | INTRAVENOUS | Status: DC | PRN
  Administered 2018-06-08: 100 mg via INTRAVENOUS

## 2018-06-08 MED ORDER — MEPERIDINE HCL 50 MG/ML IJ SOLN: 6.2500 mg | INTRAMUSCULAR | Status: DC | PRN

## 2018-06-08 MED ORDER — LIDOCAINE HCL (PF) 1 % IJ SOLN: 5.0000 mL | INTRAMUSCULAR | Status: DC | PRN

## 2018-06-08 MED ORDER — PRO-STAT SUGAR FREE PO LIQD
30.0000 mL | Freq: Two times a day (BID) | ORAL | Status: DC
  Filled 2018-06-08: qty 30

## 2018-06-08 SURGICAL SUPPLY — 45 items
BANDAGE ACE 6X5 VEL STRL LF (GAUZE/BANDAGES/DRESSINGS) ×2 IMPLANT
BANDAGE ELASTIC 6 VELCRO ST LF (GAUZE/BANDAGES/DRESSINGS) ×1 IMPLANT
BANDAGE ESMARK 6X9 LF (GAUZE/BANDAGES/DRESSINGS) IMPLANT
BLADE SAW RECIP 87.9 MT (BLADE) ×2 IMPLANT
BNDG CMPR 9X6 STRL LF SNTH (GAUZE/BANDAGES/DRESSINGS)
BNDG COHESIVE 6X5 TAN STRL LF (GAUZE/BANDAGES/DRESSINGS) ×2 IMPLANT
BNDG ESMARK 6X9 LF (GAUZE/BANDAGES/DRESSINGS)
BNDG GAUZE ELAST 4 BULKY (GAUZE/BANDAGES/DRESSINGS) ×3 IMPLANT
CANISTER SUCT 3000ML PPV (MISCELLANEOUS) ×2 IMPLANT
CLIP VESOCCLUDE MED 6/CT (CLIP) IMPLANT
COVER BACK TABLE 60X90IN (DRAPES) ×2 IMPLANT
COVER SURGICAL LIGHT HANDLE (MISCELLANEOUS) ×2 IMPLANT
CUFF TOURNIQUET SINGLE 18IN (TOURNIQUET CUFF) IMPLANT
CUFF TOURNIQUET SINGLE 24IN (TOURNIQUET CUFF) IMPLANT
CUFF TOURNIQUET SINGLE 34IN LL (TOURNIQUET CUFF) IMPLANT
CUFF TOURNIQUET SINGLE 44IN (TOURNIQUET CUFF) IMPLANT
DRAIN CHANNEL 19F RND (DRAIN) IMPLANT
DRAPE HALF SHEET 40X57 (DRAPES) ×4 IMPLANT
DRAPE ORTHO SPLIT 77X108 STRL (DRAPES) ×4
DRAPE SURG ORHT 6 SPLT 77X108 (DRAPES) ×2 IMPLANT
DRSG ADAPTIC 3X8 NADH LF (GAUZE/BANDAGES/DRESSINGS) ×2 IMPLANT
ELECT REM PT RETURN 9FT ADLT (ELECTROSURGICAL) ×2
ELECTRODE REM PT RTRN 9FT ADLT (ELECTROSURGICAL) ×1 IMPLANT
EVACUATOR SILICONE 100CC (DRAIN) IMPLANT
GAUZE SPONGE 4X4 12PLY STRL (GAUZE/BANDAGES/DRESSINGS) ×3 IMPLANT
GLOVE BIO SURGEON STRL SZ7.5 (GLOVE) ×2 IMPLANT
GOWN STRL REUS W/ TWL LRG LVL3 (GOWN DISPOSABLE) ×3 IMPLANT
GOWN STRL REUS W/TWL LRG LVL3 (GOWN DISPOSABLE) ×6
KIT BASIN OR (CUSTOM PROCEDURE TRAY) ×2 IMPLANT
KIT TURNOVER KIT B (KITS) ×2 IMPLANT
NS IRRIG 1000ML POUR BTL (IV SOLUTION) ×2 IMPLANT
PACK GENERAL/GYN (CUSTOM PROCEDURE TRAY) ×2 IMPLANT
PAD ARMBOARD 7.5X6 YLW CONV (MISCELLANEOUS) ×4 IMPLANT
STAPLER VISISTAT 35W (STAPLE) ×2 IMPLANT
STOCKINETTE IMPERVIOUS LG (DRAPES) ×2 IMPLANT
SUT ETHILON 3 0 PS 1 (SUTURE) IMPLANT
SUT SILK 2 0 (SUTURE) ×2
SUT SILK 2 0 SH CR/8 (SUTURE) ×2 IMPLANT
SUT SILK 2-0 18XBRD TIE 12 (SUTURE) ×1 IMPLANT
SUT VIC AB 2-0 SH 18 (SUTURE) ×4 IMPLANT
SUT VIC AB 3-0 SH 27 (SUTURE) ×2
SUT VIC AB 3-0 SH 27X BRD (SUTURE) ×1 IMPLANT
TOWEL GREEN STERILE (TOWEL DISPOSABLE) ×4 IMPLANT
UNDERPAD 30X30 (UNDERPADS AND DIAPERS) ×2 IMPLANT
WATER STERILE IRR 1000ML POUR (IV SOLUTION) ×2 IMPLANT

## 2018-06-08 NOTE — Progress Notes (Signed)
Pt has pre-existing LUE AVF, positive bruit & thrill. Pt has pre-existing right upper chest HD cath.-both ports capped & clamped, gauze dsg. CDI/occlusive.

## 2018-06-08 NOTE — Interval H&P Note (Signed)
History and Physical Interval Note:  06/08/2018 9:16 AM  [name]  has presented today for surgery, with the diagnosis of nonviable tissue  The various methods of treatment have been discussed with the patient and family. After consideration of risks, benefits and other options for treatment, the patient has consented to  Procedure(s): AMPUTATION BELOW KNEE POSSIBLE ABOVE KNEE (Right) as a surgical intervention .  The patient's history has been reviewed, patient examined, no change in status, stable for surgery.  I have reviewed the patient's chart and labs.  Questions were answered to the patient's satisfaction.     [name]

## 2018-06-08 NOTE — Progress Notes (Signed)
Patient arrived to the unit from Hemodialysis. Patient had a right BKA earlier today. She is alert and oriented and is not complaining of any pain. Patient has been placed on telemetry and CCMD notified with 2nd verification. Patient has RHD cath and a LA Fistula with B & T. Patient surgical wound is wrapped with ace bandage and it is clean dry and intact. Patient has been oriented to room and equipment in the room. Her husband is with her at the bedside. Tray has been ordered for patient. Will continue to monitor patient.

## 2018-06-08 NOTE — Interval H&P Note (Signed)
History and Physical Interval Note:  06/08/2018 9:12 AM  [name]  has presented today for surgery, with the diagnosis of nonviable tissue  The various methods of treatment have been discussed with the patient and family. After consideration of risks, benefits and other options for treatment, the patient has consented to  Procedure(s): AMPUTATION BELOW KNEE POSSIBLE ABOVE KNEE (Right) as a surgical intervention .  The patient's history has been reviewed, patient examined, no change in status, stable for surgery.  I have reviewed the patient's chart and labs.  Questions were answered to the patient's satisfaction.     [name]

## 2018-06-08 NOTE — Anesthesia Postprocedure Evaluation (Signed)
Anesthesia Post Note  Patient: [name]  Procedure(s) Performed: RIGHT ABOVE KNEE AMPUTATION (Right )     Patient location during evaluation: PACU Anesthesia Type: General Level of consciousness: awake and alert, oriented and patient cooperative Pain management: pain level controlled Vital Signs Assessment: post-procedure vital signs reviewed and stable Respiratory status: spontaneous breathing, nonlabored ventilation, respiratory function stable and patient connected to nasal cannula oxygen Cardiovascular status: blood pressure returned to baseline and stable Postop Assessment: no apparent nausea or vomiting Anesthetic complications: no    Last Vitals:  Vitals:   06/08/18 1310 06/08/18 1315  BP: (!) 155/51 (!) 155/57  Pulse: 92 84  Resp: (!) 21   Temp: (!) 36.3 C   SpO2: 95%     Last Pain:  Vitals:   06/08/18 1310  TempSrc: Oral  PainSc: 0-No pain                 Helon Wisinski,E. Onita Pfluger

## 2018-06-08 NOTE — Consult Note (Addendum)
[facility] Renal Consultation Note    Indication for Consultation:  Management of ESRD/hemodialysis, anemia, hypertension/volume, and secondary hyperparathyroidism. PCP:  HPI: [name] is a 72 y.o. 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  Presented for elective surgery this morning. At this time, she is sleepy post-surgery, but denies CP, dyspnea, fever or chills. RN reports that she has been tearful over the loss of her leg. Per notes, no complications with her surgery this morning.  Dialyzes on TTS schedule at [facility]. Last HD was Sat 9/7. Her L AVF is maturing, still using TDC as access.  Past Medical History:  Diagnosis Date  . Anemia   . Arthritis   . Asthma   . Atrial fibrillation ([place])   . Atrial flutter [facility])    TEE/DCCV December 2013 - on Xarelto [facility])  . Bone spur of toe    Right 5th toe  . Chronic diastolic CHF (congestive heart failure) (HCC)    a. EF 50-55% by echo in 2015  . CKD (chronic kidney disease) stage 4, GFR 15-29 ml/min (HCC)    a. s/p fistula placement but not on HD. Followed by Nephrology.   . Colon polyposis   . COPD (chronic obstructive pulmonary disease) ([place])   . DVT of upper extremity (deep vein thrombosis) ([place])    Right basilic vein, June 6503  . Essential hypertension, benign   . Fractures   . Glaucoma   . SLE (systemic lupus erythematosus) ([place])   . Type 2 diabetes mellitus ([place])   . UTI (lower urinary tract infection)   . Wound of right leg 06/2017   Past Surgical History:  Procedure Laterality Date  . ABDOMINAL AORTOGRAM W/LOWER EXTREMITY N/A 12/18/2017   Procedure: ABDOMINAL AORTOGRAM W/LOWER EXTREMITY;  Surgeon: Elam Dutch, MD;  Location: [place] [facility];  Service: Cardiovascular;  Laterality: N/A;  bilateral  . ABDOMINAL HYSTERECTOMY  1983  . ANKLE SURGERY  1993  . AV  FISTULA PLACEMENT Left 03/27/2014   Procedure: ARTERIOVENOUS (AV) FISTULA CREATION;  Surgeon: Rosetta Posner, MD;  Location: [place];  Service: Vascular;  Laterality: Left;  . BACK SURGERY  1980  . BASCILIC VEIN TRANSPOSITION Left 01/13/2018   Procedure: LEFT BASILIC VEIN TRANSPOSITION SECOND STAGE;  Surgeon: Elam Dutch, MD;  Location: [place];  Service: Vascular;  Laterality: Left;  . CARDIOVASCULAR STRESS TEST  12/19/2009   no stress induced rhythm abnormalities, ekg negative for ischemia  . CARDIOVERSION  09/16/2012   Procedure: CARDIOVERSION;  Surgeon: Sanda Klein, MD;  Location: [facility];  Service: Cardiovascular;  Laterality: N/A;  . CATARACT EXTRACTION    . COLONOSCOPY  09/20/2012   Dr. [name]: multiple tubular adenomas   . COLONOSCOPY  2005   Dr. [name]. Polyps, path unknown   . COLONOSCOPY N/A 07/24/2016   Procedure: COLONOSCOPY;  Surgeon: [name], MD;  [facility];  Service: Endoscopy;  Laterality: N/A;  9:00 am  . ESOPHAGOGASTRODUODENOSCOPY  09/20/2012   Dr. [name]: duodenal erosion and possible resolving ulcer at the angularis   . ESOPHAGOGASTRODUODENOSCOPY N/A 03/24/2018   Procedure: ESOPHAGOGASTRODUODENOSCOPY (EGD);  Surgeon: [name], MD;  [facility];  Service: Endoscopy;  Laterality: N/A;  12:45pm- moved up to arrive at 8:30am per Tretha Sciara  . EYE SURGERY    . IR FLUORO GUIDE CV LINE RIGHT  11/09/2017  .  IR US GUIDE VASC ACCESS RIGHT  11/09/2017  . REVISON OF ARTERIOVENOUS FISTULA Left 03/15/2018   Procedure: REVISION OF Left arm BASILIC VEIN TRANSPOSITION;  Surgeon: Angelia Mould, MD;  Location: [place];  Service: Vascular;  Laterality: Left;  . TEE WITHOUT CARDIOVERSION  09/16/2012   Procedure: TRANSESOPHAGEAL ECHOCARDIOGRAM (TEE);  Surgeon: Sanda Klein, MD;  Location: [facility];  Service: Cardiovascular;  Laterality: N/A;  . TRANSESOPHAGEAL ECHOCARDIOGRAM WITH CARDIOVERSION  09/16/2012   EF 60-65%, moderate LVH,  moderate regurg of the aortic valve, LA moderately dilated   Family History  Problem Relation Age of Onset  . Diabetes Brother   . Diabetes Father   . Hypertension Father   . Hypertension Sister   . Stroke Mother   . Diabetes Sister   . Hypertension Brother   . Colon cancer Neg Hx    Social History:  reports that she quit smoking about 5 years ago. Her smoking use included cigarettes. She has a 30.00 pack-year smoking history. She has never used smokeless tobacco. She reports that she does not drink alcohol or use drugs.  ROS: As per HPI otherwise negative.  Physical Exam: Vitals:   06/08/18 1217 06/08/18 1230 06/08/18 1232 06/08/18 1245  BP: (!) 146/48  (!) 159/52   Pulse:  86  90  Resp:  20  (!) 21  Temp:    98.3 F (36.8 C)  TempSrc:      SpO2:  98%  97%     General: Frail woman, NAD Head: Normocephalic, atraumatic, sclera non-icteric, mucus membranes are moist. Neck: Supple without Betsy Pries. JVD not elevated.PCLLungs: Clear bilaterally to auscultation without wheezes, rales, or rhonchi. Breathing is unlabored.Decreased bs Heart: RRR; 2/6 systolic murmur. Abdomen: Soft, non-tender, non-distended with normoactive bowel sounds. No rebound/guarding.  Musculoskeletal:  Strength and tone appear normal for age. Lower extremities: R AKA stump is bandaged; L ankle bandaged Bilat fem bruits Neuro: Alert and oriented X 3. Moves all extremities spontaneously. Psych:  Responds to questions appropriately with a normal affect. Dialysis Access: TDC in R chest, maturing LUE AVF + thrill  Allergies  Allergen Reactions  . Ace Inhibitors Cough   Prior to Admission medications   Medication Sig Start Date End Date Taking? Authorizing Provider  acetaminophen (TYLENOL) 500 MG tablet Take 1,000 mg by mouth every 6 (six) hours as needed for moderate pain.    Yes [provider]  albuterol (PROVENTIL HFA;VENTOLIN HFA) 108 (90 BASE) MCG/ACT inhaler Inhale 2 puffs into the lungs every 6  (six) hours as needed for shortness of breath.    Yes [provider]  allopurinol (ZYLOPRIM) 100 MG tablet Take 100 mg by mouth every Monday, Wednesday, and Friday.    Yes [provider]  Amino Acids-Protein Hydrolys (FEEDING SUPPLEMENT, PRO-STAT SUGAR FREE 64,) LIQD Take 30 mLs by mouth 2 (two) times daily. 12/29/17  Yes Danford, Suann Larry, MD  apixaban (ELIQUIS) 5 MG TABS tablet Take 1 tablet (5 mg total) by mouth 2 (two) times daily. 12/29/17  Yes Danford, Suann Larry, MD  atorvastatin (LIPITOR) 20 MG tablet Take 20 mg by mouth at bedtime.    Yes [provider]  brimonidine-timolol (COMBIGAN) 0.2-0.5 % ophthalmic solution Place 1 drop into the left eye 2 (two) times daily.    Yes [provider]  dicyclomine (BENTYL) 10 MG capsule Take 10 mg by mouth 4 (four) times daily -  before meals and at bedtime.   Yes [provider]  fentaNYL ([place] - DOSED  MCG/HR) 50 MCG/HR Place 50 mcg onto the skin every 3 (three) days.    Yes [provider]  folic acid (FOLVITE) 1 MG tablet Take 1 mg by mouth daily.   Yes [provider]  folic acid-vitamin b complex-vitamin c-selenium-zinc (DIALYVITE) 3 MG TABS tablet Take 1 tablet by mouth daily.   Yes [provider]  hydroxychloroquine (PLAQUENIL) 200 MG tablet Take 200 mg by mouth daily.    Yes [provider]  Insulin Detemir (LEVEMIR FLEXTOUCH) 100 UNIT/ML Pen Inject 10 Units into the skin every morning.   Yes [provider]  insulin lispro (HUMALOG KWIKPEN) 100 UNIT/ML KiwkPen Inject 5 Units into the skin 3 (three) times daily with meals as needed (*Only take if blood sugar levels are over 100).    Yes [provider]  metoprolol tartrate (LOPRESSOR) 50 MG tablet Take 50 mg by mouth See admin instructions. Take 50mg  twice a day, except on Tuesday, Thursday, Saturday only take once daily.   Yes [provider]  oxyCODONE-acetaminophen  (PERCOCET/ROXICET) 5-325 MG tablet Take 1 tablet by mouth every 6 (six) hours as needed for severe pain. 01/13/18  Yes Fields, Jessy Oto, MD  pantoprazole (PROTONIX) 40 MG tablet Take 1 tablet (40 mg total) by mouth daily. 03/29/18  Yes Rourk, [name], MD  predniSONE (DELTASONE) 5 MG tablet Take 5 mg by mouth daily with breakfast.   Yes [provider]  travoprost, benzalkonium, (TRAVATAN) 0.004 % ophthalmic solution Place 1 drop into both eyes at bedtime.    Yes [provider]  albuterol (ACCUNEB) 0.63 MG/3ML nebulizer solution Take 1 ampule by nebulization every 6 (six) hours as needed for wheezing or shortness of breath.     [provider]   Current Facility-Administered Medications  Medication Dose Route Frequency Provider Last Rate Last Dose  . 0.9 %  sodium chloride infusion   Intravenous Continuous Elam Dutch, MD 10 mL/hr at 06/08/18 0850    . Chlorhexidine Gluconate Cloth 2 % PADS 6 each  6 each Topical Once Elam Dutch, MD      . fentaNYL (SUBLIMAZE) injection 25-50 mcg  25-50 mcg Intravenous Q5 min PRN Annye Asa, MD      . fentaNYL (SUBLIMAZE) injection 25-50 mcg  25-50 mcg Intravenous Q5 min PRN Roderic Palau, MD      . meperidine (DEMEROL) injection 6.25-12.5 mg  6.25-12.5 mg Intravenous Q5 min PRN Annye Asa, MD      . metoprolol tartrate (LOPRESSOR) 50 MG tablet           . midazolam (VERSED) injection 0.5-2 mg  0.5-2 mg Intravenous Once PRN Annye Asa, MD      . promethazine (PHENERGAN) injection 6.25-12.5 mg  6.25-12.5 mg Intravenous Q15 min PRN Annye Asa, MD       Labs: Basic Metabolic Panel: Recent Labs  Lab 06/08/18 0752  NA 135  K 3.0*  CL 95*  CO2 21*  GLUCOSE 118*  BUN 100*  CREATININE 5.89*  CALCIUM 8.1*   Liver Function Tests: Recent Labs  Lab 06/08/18 0752  AST 18  ALT 13  ALKPHOS 83  BILITOT 0.4  PROT 6.6  ALBUMIN 1.8*   CBC: Recent Labs  Lab 06/08/18 0752  WBC 13.1*   HGB 8.3*  HCT 29.0*  MCV 92.9  PLT 326   CBG: Recent Labs  Lab 06/08/18 0804 06/08/18 1117  GLUCAP 100* 124*   Dialysis Orders:  TTS at [facility] 3:30hr, 400/800, EDW 69.5kg, 2K/2.5Ca,  AVF, heparin 2000 bolus - Venofer 50mg  IV weekly - Mircera 243mcg IV q 2 weeks (last given 9/5 - Hgb 7.4 at that time)  Assessment/Plan: 1.  S/p R AKA 9/10 for severe RLE wounds: Per [facility]. Suspect will need rehab. 2.  ESRD: Will dialyze today per usual TTS schedule. 4K bath, no heparin. 3.  Hypertension/volume: BP a little high, 1.5L UF goal today. Will need lower EDW at d/c. 4.  Anemia: Hgb 8.3 - not due for ESA yet.Will give 5.  Metabolic bone disease: Ca ok, Phos pending. No VDRA. 6.  Nutrition: Alb very low, will add pro-stat supps. 7.  Type 2 DM 8.  PVD, L foot wound: Per [facility].  Dana Penton, PA-C 06/08/2018, 1:17 PM  [facility] Pager: [phone] I have seen and examined this patient and agree with the plan of care seen , eval, examined, counseled, discussed with PA .  [name] 06/08/2018, 2:15 PM

## 2018-06-08 NOTE — Anesthesia Procedure Notes (Signed)
Procedure Name: Intubation Date/Time: 06/08/2018 10:01 AM Performed by: Bryson Corona, CRNA Pre-anesthesia Checklist: Patient identified, Emergency Drugs available, Suction available and Patient being monitored Patient Re-evaluated:Patient Re-evaluated prior to induction Oxygen Delivery Method: Circle System Utilized Preoxygenation: Pre-oxygenation with 100% oxygen Induction Type: IV induction Ventilation: Mask ventilation without difficulty Laryngoscope Size: Mac and 3 Grade View: Grade I Tube type: Oral Tube size: 7.0 mm Number of attempts: 1 Airway Equipment and Method: Stylet and Oral airway Placement Confirmation: ETT inserted through vocal cords under direct vision,  positive ETCO2 and breath sounds checked- equal and bilateral Secured at: 23 cm Tube secured with: Tape Dental Injury: Teeth and Oropharynx as per pre-operative assessment

## 2018-06-08 NOTE — Op Note (Signed)
[facility] OPERATIVE NOTE  [facility] OPERATIVE NOTE  Procedure: Right above knee amputation  Surgeon(s): Elam Dutch, MD  ASSISTANT: Leontine Locket, PA-C  Anesthesia: General  Specimens: Right leg  PROCEDURE DETAIL: After obtaining informed consent, the patient was taken to the operating room. The patient was placed in supine position the operating room table. After induction of general anesthesia and endotracheal intubation the patient's entire right lower extremity was prepped and draped in usual sterile fashion. A circumferential incision was made on the right leg just above the knee. The incision was carried down into the sucutaneous tissues down to level the saphenous vein. This was ligated and divided between silk ties. Soft tissues were taken down as well as the muscle and fascia with cautery. The superficial femoral artery and vein were dissected free circumferentially clamped and divided. These were suture ligated proximally. Remainder of the soft tissues were taken down with cautery. The periosteum was raised on the femur approximately 5 cm above the skin edge. The femur was divided at this level. The leg was passed off the table as a specimen. Hemostasis was obtained. The wound was thoroughly irrigated with normal saline solution. The fascial edges were reapproximated using interrupted 2 0 Vicryl sutures. The subcutaneous tissues reapproximated using a running 3-0 Vicryl suture. The skin was closed staples. Patient tolerated procedure well and there were no complications. Instrument sponge and needle counts correct in the case. Patient was taken to recovery in stable condition.  [name], MD [facility] [place] Office: [phone] Pager: [phone]

## 2018-06-08 NOTE — Anesthesia Preprocedure Evaluation (Addendum)
Anesthesia Evaluation  Patient identified by MRN, date of birth, ID band Patient awake    Reviewed: Allergy & Precautions, NPO status , Patient's Chart, lab work & pertinent test results, reviewed documented beta blocker date and time   History of Anesthesia Complications Negative for: history of anesthetic complications  Airway Mallampati: II  TM Distance: >3 FB Neck ROM: Full    Dental  (+) Edentulous Upper, Edentulous Lower   Pulmonary COPD (last inhaler needed a few weeks ago),  COPD inhaler, former smoker,    breath sounds clear to auscultation       Cardiovascular hypertension, Pt. on medications and Pt. on home beta blockers (-) angina+ Peripheral Vascular Disease and + DVT (2015)  + dysrhythmias Atrial Fibrillation  Rhythm:Irregular Rate:Normal  2/19 ECHO: EF 55-60%, mod AI, mod MR, mod TR   Neuro/Psych negative neurological ROS     GI/Hepatic Neg liver ROS, GERD  Medicated and Controlled,  Endo/Other  diabetes (glu 100), Insulin Dependent  Renal/GU ESRF and DialysisRenal disease (K+ 3.0, TuThSa dialysis)     Musculoskeletal   Abdominal   Peds  Hematology  (+) Blood dyscrasia (Hb 8.3), anemia , eliquis   Anesthesia Other Findings   Reproductive/Obstetrics                            Anesthesia Physical Anesthesia Plan  ASA: III  Anesthesia Plan: General   Post-op Pain Management:    Induction: Intravenous  PONV Risk Score and Plan: 4 or greater and Ondansetron, Dexamethasone, Treatment may vary due to age or medical condition and Diphenhydramine  Airway Management Planned: Oral ETT  Additional Equipment:   Intra-op Plan:   Post-operative Plan: Extubation in OR  Informed Consent: I have reviewed the patients History and Physical, chart, labs and discussed the procedure including the risks, benefits and alternatives for the proposed anesthesia with the patient or  authorized representative who has indicated his/her understanding and acceptance.   Dental advisory given  Plan Discussed with: CRNA and Surgeon  Anesthesia Plan Comments: (Plan routine monitors, GETA)       Anesthesia Quick Evaluation

## 2018-06-08 NOTE — Transfer of Care (Signed)
Immediate Anesthesia Transfer of Care Note  Patient: [name]  Procedure(s) Performed: RIGHT ABOVE KNEE AMPUTATION (Right )  Patient Location: PACU  Anesthesia Type:General  Level of Consciousness: awake and alert   Airway & Oxygen Therapy: Patient Spontanous Breathing and Patient connected to nasal cannula oxygen  Post-op Assessment: Report given to RN and Post -op Vital signs reviewed and stable  Post vital signs: Reviewed and stable  Last Vitals:  Vitals Value Taken Time  BP 138/58 06/08/2018 11:17 AM  Temp    Pulse 73 06/08/2018 11:19 AM  Resp 25 06/08/2018 11:19 AM  SpO2 100 % 06/08/2018 11:19 AM  Vitals shown include unvalidated device data.  Last Pain:  Vitals:   06/08/18 0853  TempSrc:   PainSc: 8       Patients Stated Pain Goal: 3 (49/32/41 9914)  Complications: No apparent anesthesia complications

## 2018-06-09 ENCOUNTER — Encounter (HOSPITAL_COMMUNITY): Payer: Self-pay | Admitting: Vascular Surgery

## 2018-06-09 ENCOUNTER — Ambulatory Visit: Payer: Medicare Other | Admitting: Internal Medicine

## 2018-06-09 DIAGNOSIS — G8918 Other acute postprocedural pain: Secondary | ICD-10-CM

## 2018-06-09 DIAGNOSIS — E1140 Type 2 diabetes mellitus with diabetic neuropathy, unspecified: Secondary | ICD-10-CM | POA: Diagnosis not present

## 2018-06-09 DIAGNOSIS — J449 Chronic obstructive pulmonary disease, unspecified: Secondary | ICD-10-CM

## 2018-06-09 DIAGNOSIS — D72829 Elevated white blood cell count, unspecified: Secondary | ICD-10-CM

## 2018-06-09 DIAGNOSIS — Z89611 Acquired absence of right leg above knee: Secondary | ICD-10-CM

## 2018-06-09 DIAGNOSIS — E1121 Type 2 diabetes mellitus with diabetic nephropathy: Secondary | ICD-10-CM

## 2018-06-09 DIAGNOSIS — S78111A Complete traumatic amputation at level between right hip and knee, initial encounter: Secondary | ICD-10-CM

## 2018-06-09 DIAGNOSIS — E11319 Type 2 diabetes mellitus with unspecified diabetic retinopathy without macular edema: Secondary | ICD-10-CM

## 2018-06-09 LAB — CBC
HCT: 26.8 % — ABNORMAL LOW (ref 36.0–46.0)
Hemoglobin: 7.7 g/dL — ABNORMAL LOW (ref 12.0–15.0)
MCH: 26.5 pg (ref 26.0–34.0)
MCHC: 28.7 g/dL — ABNORMAL LOW (ref 30.0–36.0)
MCV: 92.1 fL (ref 78.0–100.0)
PLATELETS: 276 10*3/uL (ref 150–400)
RBC: 2.91 MIL/uL — AB (ref 3.87–5.11)
RDW: 18.1 % — ABNORMAL HIGH (ref 11.5–15.5)
WBC: 12 10*3/uL — AB (ref 4.0–10.5)

## 2018-06-09 LAB — PREPARE RBC (CROSSMATCH)

## 2018-06-09 LAB — GLUCOSE, CAPILLARY
GLUCOSE-CAPILLARY: 167 mg/dL — AB (ref 70–99)
GLUCOSE-CAPILLARY: 134 mg/dL — AB (ref 70–99)
GLUCOSE-CAPILLARY: 231 mg/dL — AB (ref 70–99)
Glucose-Capillary: 245 mg/dL — ABNORMAL HIGH (ref 70–99)

## 2018-06-09 LAB — HEMOGLOBIN A1C
Hgb A1c MFr Bld: 5.4 % (ref 4.8–5.6)
Mean Plasma Glucose: 108.28 mg/dL

## 2018-06-09 LAB — BASIC METABOLIC PANEL
Anion gap: 13 (ref 5–15)
BUN: 30 mg/dL — AB (ref 8–23)
CO2: 25 mmol/L (ref 22–32)
Calcium: 7.7 mg/dL — ABNORMAL LOW (ref 8.9–10.3)
Chloride: 98 mmol/L (ref 98–111)
Creatinine, Ser: 3 mg/dL — ABNORMAL HIGH (ref 0.44–1.00)
GFR calc Af Amer: 17 mL/min — ABNORMAL LOW (ref >60)
GFR calc non Af Amer: 15 mL/min — ABNORMAL LOW (ref >60)
GLUCOSE: 191 mg/dL — AB (ref 70–99)
Potassium: 3.9 mmol/L (ref 3.5–5.1)
Sodium: 136 mmol/L (ref 135–145)

## 2018-06-09 MED ORDER — SODIUM CHLORIDE 0.9% IV SOLUTION: Freq: Once | INTRAVENOUS | Status: DC

## 2018-06-09 MED ORDER — DARBEPOETIN ALFA 200 MCG/0.4ML IJ SOSY
200.0000 ug | PREFILLED_SYRINGE | INTRAMUSCULAR | Status: DC
  Administered 2018-06-10: 200 ug via INTRAVENOUS
  Filled 2018-06-09: qty 0.4

## 2018-06-09 MED ORDER — CHLORHEXIDINE GLUCONATE CLOTH 2 % EX PADS
6.0000 | MEDICATED_PAD | Freq: Every day | CUTANEOUS | Status: DC
  Administered 2018-06-09 – 2018-06-10 (×2): 6 via TOPICAL

## 2018-06-09 NOTE — Telephone Encounter (Signed)
Encounter opened in error

## 2018-06-09 NOTE — Evaluation (Signed)
Occupational Therapy Evaluation Patient Details Name: [name] MRN: [id number] DOB: 12-03-1945 Today's Date: 06/09/2018    History of Present Illness Pt is a 72 y.o. female admitted 06/08/18 with severe RLE wounds; now s/p R AKA 9/10. PMH includes HTN, ESRD (HD TTS), metabolic bone disease, DM2, PVD w/ L foot wound.    Clinical Impression   [name] limited due to pt fearful of mobility from recliner and fatigue, pt also tearful. Pt with decline in function and safety with ADLs and ADL mobility with decreased strength, balance and endurance. Pt also with cognitive/STM impaired. Pt reports that PTA, she was able to SPT from w/c to toilet and perform toileting without assist; her niece assists her with bathing and LB ADLs. Pt would benefit from acute OT services to address impairments to maximize level of function and safety    Follow Up Recommendations  CIR    Equipment Recommendations  Other (comment)(TBD at next venue of care)    Recommendations for Other Services       Precautions / Restrictions Precautions Precautions: Fall;Other (comment) Precaution Comments: R AKA, L foot wounds Restrictions Weight Bearing Restrictions: Yes RLE Weight Bearing: Non weight bearing      Mobility Bed Mobility Overal bed mobility: Needs Assistance Bed Mobility: Supine to Sit     Supine to sit: Mod assist;HOB elevated     General bed mobility comments: pt up in recliner upon OT arrival  Transfers Overall transfer level: Needs assistance Equipment used: 1 person hand held assist Transfers: Lateral/Scoot Transfers          Lateral/Scoot Transfers: Max assist General transfer comment: NT pt declined to attempt SPT due to L foot pain, will need lateral scoot max A per PT note    Balance Overall balance assessment: Needs assistance   Sitting balance-Leahy Scale: Fair         Standing balance comment: NT                           ADL either performed or assessed  with clinical judgement   ADL Overall ADL's : Needs assistance/impaired Eating/Feeding: Set up;Sitting   Grooming: Min guard;Sitting   Upper Body Bathing: Min guard;Sitting   Lower Body Bathing: Maximal assistance;Sitting/lateral leans   Upper Body Dressing : Min guard;Sitting   Lower Body Dressing: Total assistance     Toilet Transfer Details (indicate cue type and reason): pt declined due to L foot pain and fatigue Toileting- Clothing Manipulation and Hygiene: Total assistance         General ADL Comments: pt tearful     Vision Baseline Vision/History: Wears glasses Wears Glasses: Reading only Patient Visual Report: No change from baseline       Perception     Praxis      Pertinent Vitals/Pain Pain Assessment: Faces Faces Pain Scale: Hurts little more Pain Location: R residual limb, L foot Pain Descriptors / Indicators: Sore;Guarding;Moaning Pain Intervention(s): Limited activity within patient's tolerance;Monitored during session;Repositioned     Hand Dominance Right   Extremity/Trunk Assessment Upper Extremity Assessment Upper Extremity Assessment: Generalized weakness   Lower Extremity Assessment Lower Extremity Assessment: Defer to PT evaluation RLE Deficits / Details: s/p R AKA; hip flex grossly 3/5, limited due to pain RLE: Unable to fully assess due to pain LLE Deficits / Details: L foot wounds bandaged; grossly 3/5 throughout   Cervical / Trunk Assessment Cervical / Trunk Assessment: Kyphotic   Communication  Cognition Arousal/Alertness: Awake/alert Behavior During Therapy: WFL for tasks assessed/performed Overall Cognitive Status: Impaired/Different from baseline Area of Impairment: Orientation;Attention;Memory;Following commands;Safety/judgement;Awareness;Problem solving                 Orientation Level: Disoriented to;Time   Memory: Decreased short-term memory Following Commands: Follows one step commands consistently;Follows  multi-step commands inconsistently Safety/Judgement: Decreased awareness of deficits Awareness: Intellectual Problem Solving: Slow processing;Difficulty sequencing;Requires verbal cues General Comments: Pt initially disoriented today; reoriented, and when asked by RN later, able to recall. Pt aware of short-term memory deficits. Intermittently emotional/crying about situation (i.e. food not being delivered) but able to be redirected   General Comments       Exercises     Shoulder Instructions      Home Living Family/patient expects to be discharged to:: Private residence Living Arrangements: Spouse/significant other Available Help at Discharge: Family;Available 24 hours/day Type of Home: House Home Access: Ramped entrance     Home Layout: Two level Alternate Level Stairs-Number of Steps: 6 Alternate Level Stairs-Rails: Left Bathroom Shower/Tub: Occupational psychologist: Standard Bathroom Accessibility: Yes   Home Equipment: Cane - single point;Walker - 2 wheels;Wheelchair - manual;Shower seat;Bedside commode;Other (comment)   Additional Comments: Per pt, husband in good physical condition      Prior Functioning/Environment Level of Independence: Needs assistance  Gait / Transfers Assistance Needed: Pt has not walked in the past few months due to BLE pain, husband assists with transfers to/from wheelchair and recliner. Stair lift to 2nd floor bedroom ADL's / Homemaking Assistance Needed: Niece and/or husband assist with bathing, household tasks   Comments: Reports vision as "fair", able to watch tv. Enjoys playing Monopoly with husband        OT Problem List: Decreased strength;Decreased activity tolerance;Decreased cognition;Decreased knowledge of use of DME or AE;Impaired balance (sitting and/or standing);Decreased safety awareness;Obesity;Pain      OT Treatment/Interventions: Self-care/ADL training;Balance training;Therapeutic exercise;DME and/or AE  instruction;Neuromuscular education;Therapeutic activities;Patient/family education    OT Goals(Current goals can be found in the care plan section) Acute Rehab OT Goals Patient Stated Goal: Pt reports hopeful to walk with prosthetic in the future OT Goal Formulation: With patient Time For Goal Achievement: 06/23/18 Potential to Achieve Goals: Good ADL Goals Pt Will Perform Grooming: with supervision;with set-up;sitting Pt Will Perform Upper Body Bathing: with supervision;with set-up;sitting Pt Will Perform Lower Body Bathing: with mod assist;sitting/lateral leans Pt Will Perform Upper Body Dressing: with supervision;with set-up;sitting Pt Will Transfer to Toilet: with mod assist;with +2 assist;stand pivot transfer;with max assist  OT Frequency: Min 2X/week   Barriers to D/C: Decreased caregiver support          Co-evaluation              AM-PAC PT "6 Clicks" Daily Activity     Outcome Measure Help from another person eating meals?: None Help from another person taking care of personal grooming?: A Little Help from another person toileting, which includes using toliet, bedpan, or urinal?: Total Help from another person bathing (including washing, rinsing, drying)?: A Lot Help from another person to put on and taking off regular upper body clothing?: A Little Help from another person to put on and taking off regular lower body clothing?: Total 6 Click Score: 14   End of Session    Activity Tolerance: Patient limited by pain Patient left: in chair;with call bell/phone within reach  OT Visit Diagnosis: Muscle weakness (generalized) (M62.81);Other abnormalities of gait and mobility (R26.89);Other symptoms and signs involving cognitive  function;Pain Pain - Right/Left: Right Pain - part of body: Leg                Time: 0034-9179 OT Time Calculation (min): 18 min Charges:  OT General Charges $OT Visit: 1 Visit OT Evaluation $OT Eval Moderate Complexity: 1  Mod    Britt Bottom 06/09/2018, 11:44 AM

## 2018-06-09 NOTE — Consult Note (Signed)
Physical Medicine and Rehabilitation Consult  Reason for Consult: L-AKA Referring Physician: Dr. Oneida Alar   HPI: [name] is a 72 y.o. female with history of T2DM with nephropathy and retinopathy, COPD, DVT, A. fib/a flutter, bilateral lower extremity wounds with progression of RLE ulcers to calf and anterior leg, inability to walk and significant pain.  History taken from chart review, patient, and wife. She was willing to undergo surgical intervention and was admitted on 06/08/2018 for right AKA by Dr. Oneida Alar.  Postop therapy evaluations pending.  CIR recommended in anticipation of extensive rehab needs.   She has been wheelchair dependent for past few months but was able to perform transfers with assist. Sits in her recliner chair most of the day. Niece assist with ADLs.   Review of Systems  Constitutional: Negative for chills and fever.  HENT: Negative for hearing loss and tinnitus.   Eyes: Negative for blurred vision and double vision.  Respiratory: Negative for cough and shortness of breath.   Cardiovascular: Negative for chest pain and palpitations.  Gastrointestinal: Negative for abdominal pain, heartburn and nausea.  Genitourinary: Negative for dysuria.  Musculoskeletal: Positive for myalgias. Negative for back pain.  Skin: Negative for rash.  Neurological: Positive for weakness. Negative for dizziness and headaches.  Psychiatric/Behavioral: Positive for memory loss.  All other systems reviewed and are negative.  Past Medical History:  Diagnosis Date  . Anemia   . Arthritis   . Asthma   . Atrial fibrillation ([place])   . Atrial flutter [facility])    TEE/DCCV December 2013 - on Xarelto [facility])  . Bone spur of toe    Right 5th toe  . Chronic diastolic CHF (congestive heart failure) (HCC)    a. EF 50-55% by echo in 2015  . CKD (chronic kidney disease) stage 4, GFR 15-29 ml/min (HCC)    a. s/p fistula placement but not on HD. Followed by Nephrology.   . Colon  polyposis   . COPD (chronic obstructive pulmonary disease) ([place])   . DVT of upper extremity (deep vein thrombosis) ([place])    Right basilic vein, June 1517  . Essential hypertension, benign   . Fractures   . Glaucoma   . SLE (systemic lupus erythematosus) ([place])   . Type 2 diabetes mellitus ([place])   . UTI (lower urinary tract infection)   . Wound of right leg 06/2017    Past Surgical History:  Procedure Laterality Date  . ABDOMINAL AORTOGRAM W/LOWER EXTREMITY N/A 12/18/2017   Procedure: ABDOMINAL AORTOGRAM W/LOWER EXTREMITY;  Surgeon: Elam Dutch, MD;  Location: [place] [facility];  Service: Cardiovascular;  Laterality: N/A;  bilateral  . ABDOMINAL HYSTERECTOMY  1983  . AMPUTATION Right 06/08/2018   Procedure: RIGHT ABOVE KNEE AMPUTATION;  Surgeon: Elam Dutch, MD;  Location: [place];  Service: Vascular;  Laterality: Right;  . ANKLE SURGERY  1993  . AV FISTULA PLACEMENT Left 03/27/2014   Procedure: ARTERIOVENOUS (AV) FISTULA CREATION;  Surgeon: Rosetta Posner, MD;  Location: [place];  Service: Vascular;  Laterality: Left;  . BACK SURGERY  1980  . BASCILIC VEIN TRANSPOSITION Left 01/13/2018   Procedure: LEFT BASILIC VEIN TRANSPOSITION SECOND STAGE;  Surgeon: Elam Dutch, MD;  Location: [place];  Service: Vascular;  Laterality: Left;  . CARDIOVASCULAR STRESS TEST  12/19/2009   no stress induced rhythm abnormalities, ekg negative for ischemia  . CARDIOVERSION  09/16/2012   Procedure: CARDIOVERSION;  Surgeon: Sanda Klein, MD;  [facility];  Service: Cardiovascular;  Laterality: N/A;  . CATARACT EXTRACTION    . COLONOSCOPY  09/20/2012   Dr. [name]: multiple tubular adenomas   . COLONOSCOPY  2005   Dr. [name]. Polyps, path unknown   . COLONOSCOPY N/A 07/24/2016   Procedure: COLONOSCOPY;  Surgeon: [name], MD;  [facility];  Service: Endoscopy;  Laterality: N/A;  9:00 am  . ESOPHAGOGASTRODUODENOSCOPY  09/20/2012   Dr. [name]: duodenal erosion and  possible resolving ulcer at the angularis   . ESOPHAGOGASTRODUODENOSCOPY N/A 03/24/2018   Procedure: ESOPHAGOGASTRODUODENOSCOPY (EGD);  Surgeon: [name], MD;  [facility];  Service: Endoscopy;  Laterality: N/A;  12:45pm- moved up to arrive at 8:30am per Tretha Sciara  . EYE SURGERY    . IR FLUORO GUIDE CV LINE RIGHT  11/09/2017  . IR US GUIDE VASC ACCESS RIGHT  11/09/2017  . REVISON OF ARTERIOVENOUS FISTULA Left 03/15/2018   Procedure: REVISION OF Left arm BASILIC VEIN TRANSPOSITION;  Surgeon: Angelia Mould, MD;  Location: [place];  Service: Vascular;  Laterality: Left;  . TEE WITHOUT CARDIOVERSION  09/16/2012   Procedure: TRANSESOPHAGEAL ECHOCARDIOGRAM (TEE);  Surgeon: Sanda Klein, MD;  Location: [facility];  Service: Cardiovascular;  Laterality: N/A;  . TRANSESOPHAGEAL ECHOCARDIOGRAM WITH CARDIOVERSION  09/16/2012   EF 60-65%, moderate LVH, moderate regurg of the aortic valve, LA moderately dilated    Family History  Problem Relation Age of Onset  . Diabetes Brother   . Diabetes Father   . Hypertension Father   . Hypertension Sister   . Stroke Mother   . Diabetes Sister   . Hypertension Brother   . Colon cancer Neg Hx    Social History:   Married. Husband supportive and she reports that family has been assisting PTA. per reports that she quit smoking about 5 years ago. Her smoking use included cigarettes. She has a 30.00 pack-year smoking history. She has never used smokeless tobacco. She reports that she does not drink alcohol or use drugs.   Allergies  Allergen Reactions  . Ace Inhibitors Cough    Medications Prior to Admission  Medication Sig Dispense Refill  . acetaminophen (TYLENOL) 500 MG tablet Take 1,000 mg by mouth every 6 (six) hours as needed for moderate pain.     Marland Kitchen albuterol (PROVENTIL HFA;VENTOLIN HFA) 108 (90 BASE) MCG/ACT inhaler Inhale 2 puffs into the lungs every 6 (six) hours as needed for shortness of breath.     . allopurinol (ZYLOPRIM)  100 MG tablet Take 100 mg by mouth every Monday, Wednesday, and Friday.     . Amino Acids-Protein Hydrolys (FEEDING SUPPLEMENT, PRO-STAT SUGAR FREE 64,) LIQD Take 30 mLs by mouth 2 (two) times daily. 900 mL 0  . apixaban (ELIQUIS) 5 MG TABS tablet Take 1 tablet (5 mg total) by mouth 2 (two) times daily. 60 tablet 6  . atorvastatin (LIPITOR) 20 MG tablet Take 20 mg by mouth at bedtime.     . brimonidine-timolol (COMBIGAN) 0.2-0.5 % ophthalmic solution Place 1 drop into the left eye 2 (two) times daily.     Marland Kitchen dicyclomine (BENTYL) 10 MG capsule Take 10 mg by mouth 4 (four) times daily -  before meals and at bedtime.    . fentaNYL (DURAGESIC - DOSED MCG/HR) 50 MCG/HR Place 50 mcg onto the skin every 3 (three) days.     . folic acid (FOLVITE) 1 MG tablet Take 1 mg by mouth daily.    . folic acid-vitamin b complex-vitamin c-selenium-zinc (DIALYVITE) 3  MG TABS tablet Take 1 tablet by mouth daily.    . hydroxychloroquine (PLAQUENIL) 200 MG tablet Take 200 mg by mouth daily.     . Insulin Detemir (LEVEMIR FLEXTOUCH) 100 UNIT/ML Pen Inject 10 Units into the skin every morning.    . insulin lispro (HUMALOG KWIKPEN) 100 UNIT/ML KiwkPen Inject 5 Units into the skin 3 (three) times daily with meals as needed (*Only take if blood sugar levels are over 100).     . metoprolol tartrate (LOPRESSOR) 50 MG tablet Take 50 mg by mouth See admin instructions. Take 50mg  twice a day, except on Tuesday, Thursday, Saturday only take once daily.    Marland Kitchen oxyCODONE-acetaminophen (PERCOCET/ROXICET) 5-325 MG tablet Take 1 tablet by mouth every 6 (six) hours as needed for severe pain. 20 tablet 0  . pantoprazole (PROTONIX) 40 MG tablet Take 1 tablet (40 mg total) by mouth daily. 30 tablet 11  . predniSONE (DELTASONE) 5 MG tablet Take 5 mg by mouth daily with breakfast.    . travoprost, benzalkonium, (TRAVATAN) 0.004 % ophthalmic solution Place 1 drop into both eyes at bedtime.     Marland Kitchen albuterol (ACCUNEB) 0.63 MG/3ML nebulizer solution  Take 1 ampule by nebulization every 6 (six) hours as needed for wheezing or shortness of breath.       Home:    Functional History:   Functional Status:  Mobility:          ADL:    Cognition: Cognition Orientation Level: Oriented X4    Blood pressure (!) 151/50, pulse 70, temperature 97.9 F (36.6 C), temperature source Oral, resp. rate (!) 22, weight 63.7 kg, SpO2 100 %. Physical Exam  Nursing note and vitals reviewed. Constitutional: She appears well-developed and well-nourished.  HENT:  Head: Normocephalic and atraumatic.  Eyes: Right eye exhibits no discharge. Left eye exhibits no discharge.  EOM not intact  Neck: Neck supple.  Cardiovascular: Normal rate, regular rhythm and normal heart sounds.  Respiratory: Effort normal and breath sounds normal.  GI: Soft. Bowel sounds are normal.  Musculoskeletal:  R-AKA with compressive dressing.  Left foot transmet site with coban dressing.   Neurological: She is alert.  Mild dysarthria.  Tendency to lean to the right.  Mild disorientation noted--she thought that she was [facility] but able to correct with questioning cues.  Able to follow simple motor commands.  Motor: B/l UE: 4+/5 proximal to distal RLE: HF 4/5 (pain inhibition) LLE: HF, KE 4/5, ADF limited due to pain  Skin:  See above  Psychiatric: She has a normal mood and affect. Her behavior is normal.    Results for orders placed or performed during the hospital encounter of 06/08/18 (from the past 24 hour(s))  Glucose, capillary     Status: Abnormal   Collection Time: 06/08/18 11:17 AM  Result Value Ref Range   Glucose-Capillary 124 (H) 70 - 99 mg/dL   Comment 1 Notify RN   Glucose, capillary     Status: Abnormal   Collection Time: 06/08/18  4:14 PM  Result Value Ref Range   Glucose-Capillary 104 (H) 70 - 99 mg/dL  Basic metabolic panel     Status: Abnormal   Collection Time: 06/09/18  3:44 AM  Result Value Ref Range   Sodium 136 135 - 145 mmol/L    Potassium 3.9 3.5 - 5.1 mmol/L   Chloride 98 98 - 111 mmol/L   CO2 25 22 - 32 mmol/L   Glucose, Bld 191 (H) 70 - 99 mg/dL   BUN  30 (H) 8 - 23 mg/dL   Creatinine, Ser 3.00 (H) 0.44 - 1.00 mg/dL   Calcium 7.7 (L) 8.9 - 10.3 mg/dL   GFR calc non Af Amer 15 (L) >60 mL/min   GFR calc Af Amer 17 (L) >60 mL/min   Anion gap 13 5 - 15  CBC     Status: Abnormal   Collection Time: 06/09/18  3:44 AM  Result Value Ref Range   WBC 12.0 (H) 4.0 - 10.5 K/uL   RBC 2.91 (L) 3.87 - 5.11 MIL/uL   Hemoglobin 7.7 (L) 12.0 - 15.0 g/dL   HCT 26.8 (L) 36.0 - 46.0 %   MCV 92.1 78.0 - 100.0 fL   MCH 26.5 26.0 - 34.0 pg   MCHC 28.7 (L) 30.0 - 36.0 g/dL   RDW 18.1 (H) 11.5 - 15.5 %   Platelets 276 150 - 400 K/uL  Hemoglobin A1c     Status: None   Collection Time: 06/09/18  3:44 AM  Result Value Ref Range   Hgb A1c MFr Bld 5.4 4.8 - 5.6 %   Mean Plasma Glucose 108.28 mg/dL  Glucose, capillary     Status: Abnormal   Collection Time: 06/09/18  6:26 AM  Result Value Ref Range   Glucose-Capillary 167 (H) 70 - 99 mg/dL   No results found.  Assessment/Plan: Diagnosis: Right AKA Labs independently reviewed.  Records reviewed and summated above. Clean amputation daily with soap and water Monitor incision site for signs of infection or impending skin breakdown. Staples to remain in place for 3-4 weeks Stump shrinker, for edema control  Scar mobilization massaging to prevent soft tissue adherence Stump protector during therapies Prevent flexion contractures by implementing the following:   Encourage prone lying for 20-30 mins per day BID to avoid hip flexion  Contractures if medically appropriate;  Avoid prolonged sitting Post surgical pain control with oral medication Phantom limb pain control with physical modalities including desensitization techniques (gentle self massage to the residual stump,hot packs if sensation intact, [place]) and mirror therapy, TENS. If ineffective, consider pharmacological treatment  for neuropathic pain (e.g gabapentin, pregabalin, amytriptalyine, duloxetine).  Avoid injury to contralateral side  1. Does the need for close, 24 hr/day medical supervision in concert with the patient's rehab needs make it unreasonable for this patient to be served in a less intensive setting? Yes  2. Co-Morbidities requiring supervision/potential complications: T2 DM with nephropathy and retinopathy (Monitor in accordance with exercise and adjust meds as necessary), COPD (monitor RR and O2 sats with increased exertion), DVT, A. fib/a flutter (cont meds, monitor HR with increased mobility), bilateral lower extremity wounds, post-op pain (Biofeedback training with therapies to help reduce reliance on opiate pain medications, particularly IV morphine, monitor pain control during therapies, and sedation at rest and titrate to maximum efficacy to ensure participation and gains in therapies), leukocytosis (cont to monitor for signs and symptoms of infection, further workup if indicated) 3. tachypnea (monitor RR and O2 Sats with increased physical exertion) 4. Due to bladder management, safety, skin/wound care, disease management, medication administration, pain management and patient education, does the patient require 24 hr/day rehab nursing? Yes 5. Does the patient require coordinated care of a physician, rehab nurse, PT (1-2 hrs/day, 5 days/week) and OT (1-2 hrs/day, 5 days/week) to address physical and functional deficits in the context of the above medical diagnosis(es)? Yes Addressing deficits in the following areas: balance, endurance, locomotion, strength, transferring, bowel/bladder control, bathing, dressing, feeding, grooming, toileting and psychosocial support 6. Can  the patient actively participate in an intensive therapy program of at least 3 hrs of therapy per day at least 5 days per week? Potentially 7. The potential for patient to make measurable gains while on inpatient rehab is  excellent 8. Anticipated functional outcomes upon discharge from inpatient rehab are Min/Mod A wheelchair level  with PT, Min/Mod A at wheelchair level with OT, n/a with SLP. 9. Estimated rehab length of stay to reach the above functional goals is: 12-16 days. 10. Anticipated D/C setting: Home 11. Anticipated post D/C treatments: HH therapy and Home excercise program 12. Overall Rehab/Functional Prognosis: good and fair  RECOMMENDATIONS: This patient's condition is appropriate for continued rehabilitative care in the following setting: CIR when medically stable and able to tolerate 3 hours of therapy/day. Patient has agreed to participate in recommended program. Potentially Note that insurance prior authorization may be required for reimbursement for recommended care.  Comment: Rehab Admissions Coordinator to follow up.   I have personally performed a face to face diagnostic evaluation, including, but not limited to relevant history and physical exam findings, of this patient and developed relevant assessment and plan.  Additionally, I have reviewed and concur with the physician assistant's documentation above.   Delice Lesch, MD, ABPMR Bary Leriche, PA-C 06/09/2018

## 2018-06-09 NOTE — Progress Notes (Addendum)
[facility]  Subjective  - Doing OK, comfortable.   Objective (!) 151/50 70 97.9 F (36.6 C) (Oral) (!) 22 100%  Intake/Output Summary (Last 24 hours) at 06/09/2018 0740 Last data filed at 06/08/2018 2122 Gross per 24 hour  Intake 630 ml  Output 1502 ml  Net -872 ml    Right AKA dressing in place, clean and dry. Lungs non labored breathing Heart RRR  Assessment/Planning: POD # right AKA  Plan to change the dressing tomorrow.   Start Eliquis today.   Roxy Horseman 06/09/2018 7:40 AM --  Agree with above Acute blood loss anemia Would transfuse with HD today Possible d/c next 1-2 days home most likely  [name], MD [facility] [place]: [phone] Pager: [phone]  Laboratory Lab Results: Recent Labs    06/08/18 0752 06/09/18 0344  WBC 13.1* 12.0*  HGB 8.3* 7.7*  HCT 29.0* 26.8*  PLT 326 276   BMET Recent Labs    06/08/18 0752 06/09/18 0344  NA 135 136  K 3.0* 3.9  CL 95* 98  CO2 21* 25  GLUCOSE 118* 191*  BUN 100* 30*  CREATININE 5.89* 3.00*  CALCIUM 8.1* 7.7*    COAG Lab Results  Component Value Date   INR 1.18 06/08/2018   INR 1.05 03/15/2018   INR 1.10 01/13/2018   No results found for: PTT

## 2018-06-09 NOTE — Evaluation (Signed)
Physical Therapy Evaluation Patient Details Name: [name] MRN: [id number] DOB: May 27, 1946 Today's Date: 06/09/2018   History of Present Illness  Pt is a 72 y.o. female admitted 06/08/18 with severe RLE wounds; now s/p R AKA 9/10. PMH includes HTN, ESRD (HD TTS), metabolic bone disease, DM2, PVD w/ L foot wound.     Clinical Impression  Pt presents with an overall decrease in functional mobility secondary to above. PTA, pt able to stand pivot to w/c with assist from husband; amb limited due to BLE pain. Today, pt able to perform lateral scoot transfer to recliner with maxA. Motivated to participate with therapies in order to regain strength; reports she is hopeful for walking with a prosthetic in the future. Feel pt would benefit from intensive CIR-level therapies to maximize functional mobility and decrease caregiver burden prior to return home. Will follow acutely to address established goals.     Follow Up Recommendations CIR;Supervision for mobility/OOB    Equipment Recommendations  (TBD)    Recommendations for Other Services Rehab consult;OT consult     Precautions / Restrictions Precautions Precautions: Fall;Other (comment) Precaution Comments: R AKA, L foot wounds      Mobility  Bed Mobility Overal bed mobility: Needs Assistance Bed Mobility: Supine to Sit     Supine to sit: Mod assist;HOB elevated     General bed mobility comments: Good ability to maneuver LLE to EOB; modA for UE support and trunk elevation. Once seated in recliner, able to offload each hip well with minA for pad repositioning  Transfers Overall transfer level: Needs assistance Equipment used: 1 person hand held assist Transfers: Lateral/Scoot Transfers          Lateral/Scoot Transfers: Max assist General transfer comment: MaxA to scoot from bed to drop arm recliner towards L-side; difficulty performing pivot on LLE due to pain  Ambulation/Gait                Stairs             Wheelchair Mobility    Modified Rankin (Stroke Patients Only)       Balance Overall balance assessment: Needs assistance   Sitting balance-Leahy Scale: Fair                                       Pertinent Vitals/Pain Pain Assessment: Faces Faces Pain Scale: Hurts little more Pain Location: R residual limb, L foot Pain Descriptors / Indicators: Sore;Guarding;Moaning Pain Intervention(s): Monitored during session;Repositioned    Home Living Family/patient expects to be discharged to:: Private residence Living Arrangements: Spouse/significant other Available Help at Discharge: Family;Available 24 hours/day Type of Home: House Home Access: Ramped entrance     Home Layout: Two level Home Equipment: Cane - single point;Walker - 2 wheels;Wheelchair - manual;Shower seat;Bedside commode;Other (comment)(stair lift) Additional Comments: Per pt, husband in good physical condition    Prior Function Level of Independence: Needs assistance   Gait / Transfers Assistance Needed: Pt has not walked in the past few months due to BLE pain, husband assists with transfers to/from wheelchair and recliner. Stair lift to 2nd floor bedroom  ADL's / Homemaking Assistance Needed: Niece and/or husband assist with bathing, household tasks  Comments: Reports vision as "fair", able to watch tv. Enjoys playing Monopoly with husband     Hand Dominance        Extremity/Trunk Assessment   Upper Extremity Assessment Upper Extremity Assessment:  Generalized weakness    Lower Extremity Assessment Lower Extremity Assessment: RLE deficits/detail;LLE deficits/detail RLE Deficits / Details: s/p R AKA; hip flex grossly 3/5, limited due to pain RLE: Unable to fully assess due to pain LLE Deficits / Details: L foot wounds bandaged; grossly 3/5 throughout    Cervical / Trunk Assessment Cervical / Trunk Assessment: Kyphotic  Communication      Cognition Arousal/Alertness:  Awake/alert Behavior During Therapy: WFL for tasks assessed/performed Overall Cognitive Status: Impaired/Different from baseline Area of Impairment: Orientation;Attention;Memory;Following commands;Safety/judgement;Awareness;Problem solving                 Orientation Level: Disoriented to;Time   Memory: Decreased short-term memory Following Commands: Follows one step commands consistently;Follows multi-step commands inconsistently Safety/Judgement: Decreased awareness of deficits Awareness: Intellectual Problem Solving: Slow processing;Difficulty sequencing;Requires verbal cues General Comments: Pt initially disoriented today; reoriented, and when asked by RN later, able to recall. Pt aware of short-term memory deficits. Intermittently emotional/crying about situation (i.e. food not being delivered) but able to be redirected      General Comments      Exercises     Assessment/Plan    PT Assessment Patient needs continued PT services  PT Problem List Decreased strength;Decreased range of motion;Decreased activity tolerance;Decreased balance;Decreased mobility;Decreased cognition;Decreased knowledge of use of DME;Decreased safety awareness;Decreased knowledge of precautions;Pain;Decreased skin integrity;Impaired sensation       PT Treatment Interventions Gait training;DME instruction;Functional mobility training;Therapeutic activities;Therapeutic exercise;Balance training;Cognitive remediation;Wheelchair mobility training;Patient/family education    PT Goals (Current goals can be found in the Care Plan section)  Acute Rehab PT Goals Patient Stated Goal: Pt reports hopeful to walk with prosthetic in the future PT Goal Formulation: With patient Time For Goal Achievement: 06/23/18 Potential to Achieve Goals: Good    Frequency Min 3X/week   Barriers to discharge        Co-evaluation               AM-PAC PT "6 Clicks" Daily Activity  Outcome Measure Difficulty  turning over in bed (including adjusting bedclothes, sheets and blankets)?: Unable Difficulty moving from lying on back to sitting on the side of the bed? : Unable Difficulty sitting down on and standing up from a chair with arms (e.g., wheelchair, bedside commode, etc,.)?: Unable Help needed moving to and from a bed to chair (including a wheelchair)?: A Lot Help needed walking in hospital room?: Total Help needed climbing 3-5 steps with a railing? : Total 6 Click Score: 7    End of Session Equipment Utilized During Treatment: Gait belt Activity Tolerance: Patient tolerated treatment well Patient left: in chair;with call bell/phone within reach;with chair alarm set Nurse Communication: Mobility status PT Visit Diagnosis: Other abnormalities of gait and mobility (R26.89);Muscle weakness (generalized) (M62.81)    Time: 5456-2563 PT Time Calculation (min) (ACUTE ONLY): 39 min   Charges:   PT Evaluation $PT Eval Moderate Complexity: 1 Mod PT Treatments $Therapeutic Activity: 23-37 mins       Mabeline Caras, PT, DPT Acute Rehabilitation Services  Pager [phone] Office [place] 06/09/2018, 10:34 AM

## 2018-06-09 NOTE — Progress Notes (Addendum)
[name] Progress Note   Subjective: Seen in room. No CP or dyspnea. Leg pain improving. Being evaluated for CIR.    Objective Vitals:   06/08/18 1645 06/08/18 1800 06/08/18 2121 06/09/18 0533  BP: (!) 174/62 (!) 161/45 (!) 170/55 (!) 151/50  Pulse: 79  78 70  Resp: 16  16 (!) 22  Temp: (!) 97.4 F (36.3 C) 98.4 F (36.9 C) 98.5 F (36.9 C) 97.9 F (36.6 C)  TempSrc: Oral Oral Oral Oral  SpO2:   100% 100%  Weight: 63.7 kg      Physical Exam General: Frail woman, NAD Heart: RRR; 2/6 SEM Lungs: CTAB Extremities: R AKA stump is bandaged, L ankle bandaged Dialysis Access: TDC in R chest, maturing LUE AVF + bruit Abdm soft, pos bs, nontender  Additional Objective Labs: Basic Metabolic Panel: Recent Labs  Lab 06/08/18 0752 06/09/18 0344  NA 135 136  K 3.0* 3.9  CL 95* 98  CO2 21* 25  GLUCOSE 118* 191*  BUN 100* 30*  CREATININE 5.89* 3.00*  CALCIUM 8.1* 7.7*   Liver Function Tests: Recent Labs  Lab 06/08/18 0752  AST 18  ALT 13  ALKPHOS 83  BILITOT 0.4  PROT 6.6  ALBUMIN 1.8*   CBC: Recent Labs  Lab 06/08/18 0752 06/09/18 0344  WBC 13.1* 12.0*  HGB 8.3* 7.7*  HCT 29.0* 26.8*  MCV 92.9 92.1  PLT 326 276   CBG: Recent Labs  Lab 06/08/18 0804 06/08/18 1117 06/08/18 1614 06/09/18 0626  GLUCAP 100* 124* 104* 167*   Medications: .  ceFAZolin (ANCEF) IV    . magnesium sulfate 1 - 4 g bolus IVPB     . sodium chloride   Intravenous Once  . allopurinol  100 mg Oral Q M,W,F  . apixaban  5 mg Oral BID  . atorvastatin  20 mg Oral QHS  . brimonidine  1 drop Left Eye BID   And  . timolol  1 drop Left Eye BID  . [START ON 06/15/2018] darbepoetin (ARANESP) injection - DIALYSIS  200 mcg Intravenous Q Tue-HD  . dicyclomine  10 mg Oral TID AC & HS  . docusate sodium  100 mg Oral Daily  . feeding supplement (NEPRO CARB STEADY)  237 mL Oral TID WC  . feeding supplement (PRO-STAT SUGAR FREE 64)  30 mL Oral BID  . fentaNYL  50 mcg  Transdermal Q72H  . folic acid  1 mg Oral Daily  . hydroxychloroquine  200 mg Oral Daily  . insulin aspart  0-15 Units Subcutaneous TID WC  . latanoprost  1 drop Both Eyes QHS  . metoprolol tartrate  50 mg Oral Once per day on Tue Thu Sat  . metoprolol tartrate  50 mg Oral 2 times per day on Sun Mon Wed Fri  . multivitamin  1 tablet Oral QHS  . pantoprazole  40 mg Oral Daily  . predniSONE  5 mg Oral Q breakfast    Dialysis Orders: TTS at [facility] 3:30hr, 400/800, EDW 69.5kg, 2K/2.5Ca, AVF, heparin 2000 bolus - Venofer 50mg  IV weekly - Mircera 268mcg IV q 2 weeks (last given 9/5 - Hgb 7.4 at that time)  Assessment/Plan: 1.  S/p R AKA 9/10 for severe RLE wounds: Per [facility]. Eval by CIR. 2.  ESRD: Continue TTS schedule, next 9/12. No heparin.needs longer HD with lower UFR. 3.  Hypertension/volume: BP slightly high, EDW will be lowered.lnger HD 4.  Anemia: Hgb down to 7.7. Not due yet for ESA. Transfuse  prn.Give ESA 5.  Metabolic bone disease: Corr Ca ok, Phos pending. No VDRA. 6.  Nutrition: Alb very low, continue Nepro and Pro-stat supps. 7.  Type 2 DM: Per primary. 8.  PVD, L foot wound: Per [facility].  Veneta Penton, PA-C 06/09/2018, 9:28 AM  [facility] Pager: [phone] I have seen and examined this patient and agree with the plan of care seen,eval, counseled, discussed with staff. Orders changed. Jeneen Rinks Alson Mcpheeters 06/09/2018, 10:28 AM

## 2018-06-09 NOTE — Discharge Instructions (Signed)

## 2018-06-10 ENCOUNTER — Encounter (HOSPITAL_COMMUNITY): Payer: Self-pay | Admitting: *Deleted

## 2018-06-10 ENCOUNTER — Inpatient Hospital Stay (HOSPITAL_COMMUNITY)
Admission: RE | Admit: 2018-06-10 | Discharge: 2018-06-24 | DRG: 559 | Disposition: A | Discharge status: Home Health | Payer: Medicare Other | Source: Intra-hospital | Attending: Physical Medicine & Rehabilitation | Admitting: Physical Medicine & Rehabilitation

## 2018-06-10 ENCOUNTER — Other Ambulatory Visit: Payer: Self-pay

## 2018-06-10 DIAGNOSIS — J449 Chronic obstructive pulmonary disease, unspecified: Secondary | ICD-10-CM | POA: Diagnosis not present

## 2018-06-10 DIAGNOSIS — E162 Hypoglycemia, unspecified: Secondary | ICD-10-CM

## 2018-06-10 DIAGNOSIS — E1152 Type 2 diabetes mellitus with diabetic peripheral angiopathy with gangrene: Secondary | ICD-10-CM | POA: Diagnosis not present

## 2018-06-10 DIAGNOSIS — Z833 Family history of diabetes mellitus: Secondary | ICD-10-CM

## 2018-06-10 DIAGNOSIS — R739 Hyperglycemia, unspecified: Secondary | ICD-10-CM | POA: Diagnosis not present

## 2018-06-10 DIAGNOSIS — Z23 Encounter for immunization: Secondary | ICD-10-CM | POA: Diagnosis not present

## 2018-06-10 DIAGNOSIS — R7309 Other abnormal glucose: Secondary | ICD-10-CM

## 2018-06-10 DIAGNOSIS — Z87891 Personal history of nicotine dependence: Secondary | ICD-10-CM

## 2018-06-10 DIAGNOSIS — Z90710 Acquired absence of both cervix and uterus: Secondary | ICD-10-CM

## 2018-06-10 DIAGNOSIS — N186 End stage renal disease: Secondary | ICD-10-CM | POA: Diagnosis not present

## 2018-06-10 DIAGNOSIS — Z86718 Personal history of other venous thrombosis and embolism: Secondary | ICD-10-CM | POA: Diagnosis not present

## 2018-06-10 DIAGNOSIS — I4892 Unspecified atrial flutter: Secondary | ICD-10-CM | POA: Diagnosis present

## 2018-06-10 DIAGNOSIS — Z7901 Long term (current) use of anticoagulants: Secondary | ICD-10-CM | POA: Diagnosis not present

## 2018-06-10 DIAGNOSIS — I5032 Chronic diastolic (congestive) heart failure: Secondary | ICD-10-CM | POA: Diagnosis present

## 2018-06-10 DIAGNOSIS — G8918 Other acute postprocedural pain: Secondary | ICD-10-CM

## 2018-06-10 DIAGNOSIS — M329 Systemic lupus erythematosus, unspecified: Secondary | ICD-10-CM | POA: Diagnosis not present

## 2018-06-10 DIAGNOSIS — Z79899 Other long term (current) drug therapy: Secondary | ICD-10-CM | POA: Diagnosis not present

## 2018-06-10 DIAGNOSIS — E1121 Type 2 diabetes mellitus with diabetic nephropathy: Secondary | ICD-10-CM | POA: Diagnosis present

## 2018-06-10 DIAGNOSIS — N2581 Secondary hyperparathyroidism of renal origin: Secondary | ICD-10-CM | POA: Diagnosis not present

## 2018-06-10 DIAGNOSIS — R7303 Prediabetes: Secondary | ICD-10-CM | POA: Diagnosis not present

## 2018-06-10 DIAGNOSIS — R0989 Other specified symptoms and signs involving the circulatory and respiratory systems: Secondary | ICD-10-CM | POA: Diagnosis not present

## 2018-06-10 DIAGNOSIS — I169 Hypertensive crisis, unspecified: Secondary | ICD-10-CM

## 2018-06-10 DIAGNOSIS — E1165 Type 2 diabetes mellitus with hyperglycemia: Secondary | ICD-10-CM | POA: Diagnosis present

## 2018-06-10 DIAGNOSIS — Z79891 Long term (current) use of opiate analgesic: Secondary | ICD-10-CM | POA: Diagnosis not present

## 2018-06-10 DIAGNOSIS — E1143 Type 2 diabetes mellitus with diabetic autonomic (poly)neuropathy: Secondary | ICD-10-CM | POA: Diagnosis present

## 2018-06-10 DIAGNOSIS — Z7952 Long term (current) use of systemic steroids: Secondary | ICD-10-CM | POA: Diagnosis not present

## 2018-06-10 DIAGNOSIS — Z4781 Encounter for orthopedic aftercare following surgical amputation: Secondary | ICD-10-CM | POA: Diagnosis not present

## 2018-06-10 DIAGNOSIS — I872 Venous insufficiency (chronic) (peripheral): Secondary | ICD-10-CM | POA: Diagnosis not present

## 2018-06-10 DIAGNOSIS — I2510 Atherosclerotic heart disease of native coronary artery without angina pectoris: Secondary | ICD-10-CM | POA: Diagnosis present

## 2018-06-10 DIAGNOSIS — L97521 Non-pressure chronic ulcer of other part of left foot limited to breakdown of skin: Secondary | ICD-10-CM | POA: Diagnosis not present

## 2018-06-10 DIAGNOSIS — I471 Supraventricular tachycardia: Secondary | ICD-10-CM | POA: Diagnosis not present

## 2018-06-10 DIAGNOSIS — R030 Elevated blood-pressure reading, without diagnosis of hypertension: Secondary | ICD-10-CM

## 2018-06-10 DIAGNOSIS — S78111A Complete traumatic amputation at level between right hip and knee, initial encounter: Secondary | ICD-10-CM

## 2018-06-10 DIAGNOSIS — E11319 Type 2 diabetes mellitus with unspecified diabetic retinopathy without macular edema: Secondary | ICD-10-CM | POA: Diagnosis present

## 2018-06-10 DIAGNOSIS — E1151 Type 2 diabetes mellitus with diabetic peripheral angiopathy without gangrene: Secondary | ICD-10-CM

## 2018-06-10 DIAGNOSIS — E1140 Type 2 diabetes mellitus with diabetic neuropathy, unspecified: Secondary | ICD-10-CM | POA: Diagnosis present

## 2018-06-10 DIAGNOSIS — I132 Hypertensive heart and chronic kidney disease with heart failure and with stage 5 chronic kidney disease, or end stage renal disease: Secondary | ICD-10-CM | POA: Diagnosis present

## 2018-06-10 DIAGNOSIS — Z794 Long term (current) use of insulin: Secondary | ICD-10-CM | POA: Diagnosis not present

## 2018-06-10 DIAGNOSIS — Z992 Dependence on renal dialysis: Secondary | ICD-10-CM | POA: Diagnosis not present

## 2018-06-10 DIAGNOSIS — Z993 Dependence on wheelchair: Secondary | ICD-10-CM | POA: Diagnosis not present

## 2018-06-10 DIAGNOSIS — I120 Hypertensive chronic kidney disease with stage 5 chronic kidney disease or end stage renal disease: Secondary | ICD-10-CM | POA: Diagnosis not present

## 2018-06-10 DIAGNOSIS — E1122 Type 2 diabetes mellitus with diabetic chronic kidney disease: Secondary | ICD-10-CM | POA: Diagnosis not present

## 2018-06-10 DIAGNOSIS — T380X5A Adverse effect of glucocorticoids and synthetic analogues, initial encounter: Secondary | ICD-10-CM | POA: Diagnosis present

## 2018-06-10 DIAGNOSIS — D638 Anemia in other chronic diseases classified elsewhere: Secondary | ICD-10-CM

## 2018-06-10 DIAGNOSIS — M79606 Pain in leg, unspecified: Secondary | ICD-10-CM

## 2018-06-10 DIAGNOSIS — M79651 Pain in right thigh: Secondary | ICD-10-CM | POA: Diagnosis present

## 2018-06-10 DIAGNOSIS — H409 Unspecified glaucoma: Secondary | ICD-10-CM | POA: Diagnosis not present

## 2018-06-10 DIAGNOSIS — Z89611 Acquired absence of right leg above knee: Secondary | ICD-10-CM

## 2018-06-10 DIAGNOSIS — I4891 Unspecified atrial fibrillation: Secondary | ICD-10-CM | POA: Diagnosis present

## 2018-06-10 DIAGNOSIS — E11622 Type 2 diabetes mellitus with other skin ulcer: Secondary | ICD-10-CM | POA: Diagnosis not present

## 2018-06-10 DIAGNOSIS — I998 Other disorder of circulatory system: Secondary | ICD-10-CM | POA: Diagnosis present

## 2018-06-10 DIAGNOSIS — E8889 Other specified metabolic disorders: Secondary | ICD-10-CM | POA: Diagnosis present

## 2018-06-10 DIAGNOSIS — G8929 Other chronic pain: Secondary | ICD-10-CM | POA: Diagnosis present

## 2018-06-10 LAB — CBC
HCT: 29.4 % — ABNORMAL LOW (ref 36.0–46.0)
HEMOGLOBIN: 8.3 g/dL — AB (ref 12.0–15.0)
MCH: 26.9 pg (ref 26.0–34.0)
MCHC: 28.2 g/dL — ABNORMAL LOW (ref 30.0–36.0)
MCV: 95.1 fL (ref 78.0–100.0)
PLATELETS: 252 10*3/uL (ref 150–400)
RBC: 3.09 MIL/uL — AB (ref 3.87–5.11)
RDW: 18.3 % — ABNORMAL HIGH (ref 11.5–15.5)
WBC: 9.4 10*3/uL (ref 4.0–10.5)

## 2018-06-10 LAB — BASIC METABOLIC PANEL
Anion gap: 11 (ref 5–15)
BUN: 47 mg/dL — ABNORMAL HIGH (ref 8–23)
CHLORIDE: 100 mmol/L (ref 98–111)
CO2: 24 mmol/L (ref 22–32)
CREATININE: 4.38 mg/dL — AB (ref 0.44–1.00)
Calcium: 7.8 mg/dL — ABNORMAL LOW (ref 8.9–10.3)
GFR calc non Af Amer: 9 mL/min — ABNORMAL LOW (ref >60)
GFR, EST AFRICAN AMERICAN: 11 mL/min — AB (ref >60)
Glucose, Bld: 174 mg/dL — ABNORMAL HIGH (ref 70–99)
Potassium: 3.7 mmol/L (ref 3.5–5.1)
Sodium: 135 mmol/L (ref 135–145)

## 2018-06-10 LAB — GLUCOSE, CAPILLARY
GLUCOSE-CAPILLARY: 153 mg/dL — AB (ref 70–99)
Glucose-Capillary: 168 mg/dL — ABNORMAL HIGH (ref 70–99)
Glucose-Capillary: 112 mg/dL — ABNORMAL HIGH (ref 70–99)

## 2018-06-10 MED ORDER — PANTOPRAZOLE SODIUM 40 MG PO TBEC
40.0000 mg | DELAYED_RELEASE_TABLET | Freq: Every day | ORAL | Status: DC
  Administered 2018-06-11 – 2018-06-24 (×14): 40 mg via ORAL
  Filled 2018-06-10 (×14): qty 1

## 2018-06-10 MED ORDER — INFLUENZA VAC SPLIT HIGH-DOSE 0.5 ML IM SUSY
0.5000 mL | PREFILLED_SYRINGE | INTRAMUSCULAR | Status: AC
  Administered 2018-06-11: 0.5 mL via INTRAMUSCULAR
  Filled 2018-06-10: qty 0.5

## 2018-06-10 MED ORDER — DIPHENHYDRAMINE HCL 12.5 MG/5ML PO ELIX: 12.5000 mg | ORAL_SOLUTION | Freq: Four times a day (QID) | ORAL | Status: DC | PRN

## 2018-06-10 MED ORDER — GUAIFENESIN-DM 100-10 MG/5ML PO SYRP: 5.0000 mL | ORAL_SOLUTION | Freq: Four times a day (QID) | ORAL | Status: DC | PRN

## 2018-06-10 MED ORDER — PROCHLORPERAZINE 25 MG RE SUPP: 12.5000 mg | Freq: Four times a day (QID) | RECTAL | Status: DC | PRN

## 2018-06-10 MED ORDER — CHLORHEXIDINE GLUCONATE CLOTH 2 % EX PADS
6.0000 | MEDICATED_PAD | Freq: Every day | CUTANEOUS | Status: DC
  Administered 2018-06-12 – 2018-06-24 (×8): 6 via TOPICAL

## 2018-06-10 MED ORDER — PRO-STAT SUGAR FREE PO LIQD
30.0000 mL | Freq: Two times a day (BID) | ORAL | Status: DC
  Administered 2018-06-10 – 2018-06-23 (×27): 30 mL via ORAL
  Filled 2018-06-10 (×27): qty 30

## 2018-06-10 MED ORDER — INSULIN ASPART 100 UNIT/ML ~~LOC~~ SOLN
0.0000 [IU] | Freq: Three times a day (TID) | SUBCUTANEOUS | Status: DC
  Administered 2018-06-11: 3 [IU] via SUBCUTANEOUS
  Administered 2018-06-11 (×2): 2 [IU] via SUBCUTANEOUS
  Administered 2018-06-12: 3 [IU] via SUBCUTANEOUS
  Administered 2018-06-12 – 2018-06-13 (×2): 2 [IU] via SUBCUTANEOUS
  Administered 2018-06-13 (×2): 3 [IU] via SUBCUTANEOUS
  Administered 2018-06-14: 2 [IU] via SUBCUTANEOUS
  Administered 2018-06-14 – 2018-06-15 (×3): 3 [IU] via SUBCUTANEOUS
  Administered 2018-06-16: 2 [IU] via SUBCUTANEOUS
  Administered 2018-06-16 – 2018-06-17 (×2): 3 [IU] via SUBCUTANEOUS
  Administered 2018-06-18 – 2018-06-22 (×4): 2 [IU] via SUBCUTANEOUS

## 2018-06-10 MED ORDER — DOCUSATE SODIUM 100 MG PO CAPS
100.0000 mg | ORAL_CAPSULE | Freq: Every day | ORAL | Status: DC
  Administered 2018-06-11 – 2018-06-23 (×13): 100 mg via ORAL
  Filled 2018-06-10 (×13): qty 1

## 2018-06-10 MED ORDER — LATANOPROST 0.005 % OP SOLN
1.0000 [drp] | Freq: Every day | OPHTHALMIC | Status: DC
  Administered 2018-06-10 – 2018-06-23 (×14): 1 [drp] via OPHTHALMIC
  Filled 2018-06-10: qty 2.5

## 2018-06-10 MED ORDER — BRIMONIDINE TARTRATE 0.2 % OP SOLN
1.0000 [drp] | Freq: Two times a day (BID) | OPHTHALMIC | Status: DC
  Administered 2018-06-10 – 2018-06-23 (×27): 1 [drp] via OPHTHALMIC
  Filled 2018-06-10: qty 5

## 2018-06-10 MED ORDER — METOPROLOL TARTRATE 50 MG PO TABS
50.0000 mg | ORAL_TABLET | ORAL | Status: DC
  Administered 2018-06-10: 50 mg via ORAL
  Filled 2018-06-10: qty 1

## 2018-06-10 MED ORDER — RENA-VITE PO TABS
1.0000 | ORAL_TABLET | Freq: Every day | ORAL | Status: DC
  Administered 2018-06-10 – 2018-06-23 (×14): 1 via ORAL
  Filled 2018-06-10 (×14): qty 1

## 2018-06-10 MED ORDER — DARBEPOETIN ALFA 200 MCG/0.4ML IJ SOSY
PREFILLED_SYRINGE | INTRAMUSCULAR | Status: AC
  Administered 2018-06-10: 200 ug via INTRAVENOUS
  Filled 2018-06-10: qty 0.4

## 2018-06-10 MED ORDER — ALUMINUM HYDROXIDE GEL 320 MG/5ML PO SUSP
10.0000 mL | ORAL | Status: DC | PRN
  Filled 2018-06-10: qty 30

## 2018-06-10 MED ORDER — BISACODYL 10 MG RE SUPP
10.0000 mg | Freq: Every day | RECTAL | Status: DC | PRN
  Administered 2018-06-16 – 2018-06-19 (×2): 10 mg via RECTAL
  Filled 2018-06-10 (×2): qty 1

## 2018-06-10 MED ORDER — PROCHLORPERAZINE MALEATE 5 MG PO TABS: 5.0000 mg | ORAL_TABLET | Freq: Four times a day (QID) | ORAL | Status: DC | PRN

## 2018-06-10 MED ORDER — TIMOLOL MALEATE 0.5 % OP SOLN
1.0000 [drp] | Freq: Two times a day (BID) | OPHTHALMIC | Status: DC
  Administered 2018-06-10 – 2018-06-23 (×27): 1 [drp] via OPHTHALMIC
  Filled 2018-06-10: qty 5

## 2018-06-10 MED ORDER — ALLOPURINOL 100 MG PO TABS
100.0000 mg | ORAL_TABLET | ORAL | Status: DC
  Administered 2018-06-11 – 2018-06-23 (×6): 100 mg via ORAL
  Filled 2018-06-10 (×6): qty 1

## 2018-06-10 MED ORDER — PREDNISONE 10 MG PO TABS
5.0000 mg | ORAL_TABLET | Freq: Every day | ORAL | Status: DC
  Administered 2018-06-11 – 2018-06-24 (×14): 5 mg via ORAL
  Filled 2018-06-10 (×14): qty 1

## 2018-06-10 MED ORDER — INSULIN DETEMIR 100 UNIT/ML ~~LOC~~ SOLN
5.0000 [IU] | Freq: Every morning | SUBCUTANEOUS | Status: DC
  Administered 2018-06-11 – 2018-06-13 (×3): 5 [IU] via SUBCUTANEOUS
  Filled 2018-06-10 (×3): qty 0.05

## 2018-06-10 MED ORDER — TRAZODONE HCL 50 MG PO TABS
25.0000 mg | ORAL_TABLET | Freq: Every evening | ORAL | Status: DC | PRN
  Administered 2018-06-10: 25 mg via ORAL
  Administered 2018-06-11: 50 mg via ORAL
  Administered 2018-06-12: 25 mg via ORAL
  Administered 2018-06-14 – 2018-06-24 (×8): 50 mg via ORAL
  Filled 2018-06-10 (×12): qty 1

## 2018-06-10 MED ORDER — OXYCODONE-ACETAMINOPHEN 5-325 MG PO TABS
1.0000 | ORAL_TABLET | Freq: Four times a day (QID) | ORAL | Status: DC | PRN
  Administered 2018-06-10 – 2018-06-24 (×40): 1 via ORAL
  Filled 2018-06-10 (×40): qty 1

## 2018-06-10 MED ORDER — HYDROXYCHLOROQUINE SULFATE 200 MG PO TABS
200.0000 mg | ORAL_TABLET | Freq: Every day | ORAL | Status: DC
  Administered 2018-06-11 – 2018-06-24 (×14): 200 mg via ORAL
  Filled 2018-06-10 (×14): qty 1

## 2018-06-10 MED ORDER — METOPROLOL TARTRATE 50 MG PO TABS: 50.0000 mg | ORAL_TABLET | ORAL | Status: DC

## 2018-06-10 MED ORDER — APIXABAN 5 MG PO TABS
5.0000 mg | ORAL_TABLET | Freq: Two times a day (BID) | ORAL | Status: DC
  Administered 2018-06-10 – 2018-06-24 (×28): 5 mg via ORAL
  Filled 2018-06-10 (×29): qty 1

## 2018-06-10 MED ORDER — DARBEPOETIN ALFA 200 MCG/0.4ML IJ SOSY
200.0000 ug | PREFILLED_SYRINGE | INTRAMUSCULAR | Status: DC
  Filled 2018-06-10 (×2): qty 0.4

## 2018-06-10 MED ORDER — NEPRO/CARBSTEADY PO LIQD
237.0000 mL | Freq: Three times a day (TID) | ORAL | Status: DC
  Administered 2018-06-13: 237 mL via ORAL

## 2018-06-10 MED ORDER — ALBUTEROL SULFATE HFA 108 (90 BASE) MCG/ACT IN AERS: 2.0000 | INHALATION_SPRAY | Freq: Four times a day (QID) | RESPIRATORY_TRACT | Status: DC | PRN

## 2018-06-10 MED ORDER — PHENOL 1.4 % MT LIQD: 1.0000 | OROMUCOSAL | Status: DC | PRN

## 2018-06-10 MED ORDER — FENTANYL 50 MCG/HR TD PT72
50.0000 ug | MEDICATED_PATCH | TRANSDERMAL | Status: DC
  Administered 2018-06-11 – 2018-06-23 (×4): 50 ug via TRANSDERMAL
  Filled 2018-06-10 (×5): qty 1

## 2018-06-10 MED ORDER — ALUM & MAG HYDROXIDE-SIMETH 200-200-20 MG/5ML PO SUSP: 15.0000 mL | ORAL | Status: DC | PRN

## 2018-06-10 MED ORDER — ATORVASTATIN CALCIUM 20 MG PO TABS
20.0000 mg | ORAL_TABLET | Freq: Every day | ORAL | Status: DC
  Administered 2018-06-10 – 2018-06-23 (×14): 20 mg via ORAL
  Filled 2018-06-10 (×14): qty 1

## 2018-06-10 MED ORDER — DICYCLOMINE HCL 10 MG PO CAPS
10.0000 mg | ORAL_CAPSULE | Freq: Three times a day (TID) | ORAL | Status: DC
  Administered 2018-06-10 – 2018-06-24 (×53): 10 mg via ORAL
  Filled 2018-06-10 (×57): qty 1

## 2018-06-10 MED ORDER — ALBUTEROL SULFATE (2.5 MG/3ML) 0.083% IN NEBU: 2.5000 mg | INHALATION_SOLUTION | Freq: Four times a day (QID) | RESPIRATORY_TRACT | Status: DC | PRN

## 2018-06-10 MED ORDER — FOLIC ACID 1 MG PO TABS
1.0000 mg | ORAL_TABLET | Freq: Every day | ORAL | Status: DC
  Administered 2018-06-11: 1 mg via ORAL
  Filled 2018-06-10: qty 1

## 2018-06-10 MED ORDER — ACETAMINOPHEN 325 MG PO TABS
325.0000 mg | ORAL_TABLET | ORAL | Status: DC | PRN
  Administered 2018-06-10 – 2018-06-11 (×2): 650 mg via ORAL
  Administered 2018-06-11 – 2018-06-13 (×2): 325 mg via ORAL
  Administered 2018-06-14 – 2018-06-23 (×11): 650 mg via ORAL
  Filled 2018-06-10 (×9): qty 2
  Filled 2018-06-10: qty 1
  Filled 2018-06-10 (×5): qty 2
  Filled 2018-06-10: qty 1
  Filled 2018-06-10: qty 2

## 2018-06-10 MED ORDER — POLYETHYLENE GLYCOL 3350 17 G PO PACK
17.0000 g | PACK | Freq: Every day | ORAL | Status: DC | PRN
  Administered 2018-06-13 – 2018-06-16 (×2): 17 g via ORAL
  Filled 2018-06-10: qty 1

## 2018-06-10 MED ORDER — PROCHLORPERAZINE EDISYLATE 10 MG/2ML IJ SOLN: 5.0000 mg | Freq: Four times a day (QID) | INTRAMUSCULAR | Status: DC | PRN

## 2018-06-10 MED ORDER — ALBUTEROL SULFATE 0.63 MG/3ML IN NEBU: 1.0000 | INHALATION_SOLUTION | Freq: Four times a day (QID) | RESPIRATORY_TRACT | Status: DC | PRN

## 2018-06-10 NOTE — Discharge Summary (Signed)
Physician Discharge Summary   Patient ID: [name] [id number] 72 y.o. [birth year]  Admit date: 06/08/2018  Discharge date and time: 06/10/18   Admitting Physician: Elam Dutch, MD   Discharge Physician: same  Admission Diagnoses: nonviable tissue  Discharge Diagnoses: s/p AKA  Admission Condition: fair  Discharged Condition: fair  Indication for Admission: nonviable RLE  Hospital Course: Ms. [name] is a 72 year old female who was brought into the hospital as an outpatient for right above-the-knee amputation Dr. Oneida Alar on 06/08/2018.  She tolerated the procedure well and was admitted to the hospital postoperatively.  Nephrology was consulted for management of end-stage renal disease on hemodialysis during hospitalization.  POD #1 PT and OT were consulted for evaluation and treatment.  Recommendations were made for inpatient rehabilitation.  Inpatient rehab team evaluated the patient and she was accepted.  POD #2 right AKA incision pain had improved and incision was unremarkable.  She underwent hemodialysis as scheduled per nephrology.  She will be discharged to the inpatient rehabilitation unit.  She will need her staples removed in 4 to 6 weeks.  It should also be mentioned that she has a chronic wound on her left foot.  Surgical management including possible amputation will be discussed on an outpatient basis.  Discharge instructions were reviewed with the patient and she voiced her understanding.  She will be discharged to inpatient rehabilitation this afternoon in stable condition.  Consults: Nephrology  Treatments: surgery: R AKA by Dr. Oneida Alar 06/08/18  Discharge Exam: see progress note 06/10/18 Vitals:   06/10/18 1600 06/10/18 1630  BP: (!) 130/46 (!) 141/51  Pulse: 73 79  Resp: 18 20  Temp:    SpO2:       Disposition: Discharge disposition: 90-DC/txfr to inpt rehab facility with planned acute care hosp IP admission       Patient Instructions:   Allergies as of 06/10/2018      Reactions   Ace Inhibitors Cough      Medication List    TAKE these medications   acetaminophen 500 MG tablet Commonly known as:  TYLENOL Take 1,000 mg by mouth every 6 (six) hours as needed for moderate pain.   albuterol 0.63 MG/3ML nebulizer solution Commonly known as:  ACCUNEB Take 1 ampule by nebulization every 6 (six) hours as needed for wheezing or shortness of breath.   albuterol 108 (90 Base) MCG/ACT inhaler Commonly known as:  PROVENTIL HFA;VENTOLIN HFA Inhale 2 puffs into the lungs every 6 (six) hours as needed for shortness of breath.   allopurinol 100 MG tablet Commonly known as:  ZYLOPRIM Take 100 mg by mouth every Monday, Wednesday, and Friday.   apixaban 5 MG Tabs tablet Commonly known as:  ELIQUIS Take 1 tablet (5 mg total) by mouth 2 (two) times daily.   atorvastatin 20 MG tablet Commonly known as:  LIPITOR Take 20 mg by mouth at bedtime.   COMBIGAN 0.2-0.5 % ophthalmic solution Generic drug:  brimonidine-timolol Place 1 drop into the left eye 2 (two) times daily.   dicyclomine 10 MG capsule Commonly known as:  BENTYL Take 10 mg by mouth 4 (four) times daily -  before meals and at bedtime.   feeding supplement (PRO-STAT SUGAR FREE 64) Liqd Take 30 mLs by mouth 2 (two) times daily.   fentaNYL 50 MCG/HR Commonly known as:  DURAGESIC - dosed mcg/hr Place 50 mcg onto the skin every 3 (three) days.   folic acid 1 MG tablet Commonly known as:  FOLVITE Take 1 mg  by mouth daily.   folic acid-vitamin b complex-vitamin c-selenium-zinc 3 MG Tabs tablet Take 1 tablet by mouth daily.   HUMALOG KWIKPEN 100 UNIT/ML KiwkPen Generic drug:  insulin lispro Inject 5 Units into the skin 3 (three) times daily with meals as needed (*Only take if blood sugar levels are over 100).   hydroxychloroquine 200 MG tablet Commonly known as:  PLAQUENIL Take 200 mg by mouth daily.   LEVEMIR FLEXTOUCH 100 UNIT/ML Pen Generic drug:  Insulin  Detemir Inject 10 Units into the skin every morning.   metoprolol tartrate 50 MG tablet Commonly known as:  LOPRESSOR Take 50 mg by mouth See admin instructions. Take 50mg  twice a day, except on Tuesday, Thursday, Saturday only take once daily.   oxyCODONE-acetaminophen 5-325 MG tablet Commonly known as:  PERCOCET/ROXICET Take 1 tablet by mouth every 6 (six) hours as needed for severe pain.   pantoprazole 40 MG tablet Commonly known as:  PROTONIX Take 1 tablet (40 mg total) by mouth daily.   predniSONE 5 MG tablet Commonly known as:  DELTASONE Take 5 mg by mouth daily with breakfast.   travoprost (benzalkonium) 0.004 % ophthalmic solution Commonly known as:  TRAVATAN Place 1 drop into both eyes at bedtime.      Activity: activity as tolerated Diet: regular diet Wound Care: keep wound clean and dry  Follow-up with Dr. Oneida Alar in 4 weeks.  SignedDagoberto Ligas 06/10/2018 4:50 PM

## 2018-06-10 NOTE — H&P (Signed)
Physical Medicine and Rehabilitation Admission H&P     CC: Functional deficits due to left AKA     HPI: [name] is a 72 year old female with history of T2DM with neuropathy nephropathy and retinopathy, COPD, DVT, A. fib/a flutter, bilateral lower extremity wounds with progression of right lower extremity except ulcers to calf and anterior leg, inability to walk and significant pain.  Patient was finally agreeable to undergo surgical intervention and was admitted on 06/08/2018 for right AKA by Dr. Oneida Alar.  Therapy evaluations done yesterday and CIR recommended due to functional deficits     ROS incomplete , lethargic in HD, denies CP, SOB, -N/V/D         Past Medical History:  Diagnosis Date  . Anemia    . Arthritis    . Asthma    . Atrial fibrillation ([place])    . Atrial flutter [facility])      TEE/DCCV December 2013 - on Xarelto [facility])  . Bone spur of toe      Right 5th toe  . Chronic diastolic CHF (congestive heart failure) (HCC)      a. EF 50-55% by echo in 2015  . CKD (chronic kidney disease) stage 4, GFR 15-29 ml/min (HCC)      a. s/p fistula placement but not on HD. Followed by Nephrology.   . Colon polyposis    . COPD (chronic obstructive pulmonary disease) ([place])    . DVT of upper extremity (deep vein thrombosis) ([place])      Right basilic vein, June 8299  . Essential hypertension, benign    . Fractures    . Glaucoma    . SLE (systemic lupus erythematosus) ([place])    . Type 2 diabetes mellitus ([place])    . UTI (lower urinary tract infection)    . Wound of right leg 06/2017           Past Surgical History:  Procedure Laterality Date  . ABDOMINAL AORTOGRAM W/LOWER EXTREMITY N/A 12/18/2017    Procedure: ABDOMINAL AORTOGRAM W/LOWER EXTREMITY;  Surgeon: Elam Dutch, MD;  Location: [place] [facility];  Service: Cardiovascular;  Laterality: N/A;  bilateral  . ABDOMINAL HYSTERECTOMY   1983  . AMPUTATION Right 06/08/2018    Procedure: RIGHT ABOVE KNEE AMPUTATION;   Surgeon: Elam Dutch, MD;  Location: [place];  Service: Vascular;  Laterality: Right;  . ANKLE SURGERY   1993  . AV FISTULA PLACEMENT Left 03/27/2014    Procedure: ARTERIOVENOUS (AV) FISTULA CREATION;  Surgeon: Rosetta Posner, MD;  Location: [place];  Service: Vascular;  Laterality: Left;  . BACK SURGERY   1980  . BASCILIC VEIN TRANSPOSITION Left 01/13/2018    Procedure: LEFT BASILIC VEIN TRANSPOSITION SECOND STAGE;  Surgeon: Elam Dutch, MD;  Location: [place];  Service: Vascular;  Laterality: Left;  . CARDIOVASCULAR STRESS TEST   12/19/2009    no stress induced rhythm abnormalities, ekg negative for ischemia  . CARDIOVERSION   09/16/2012    Procedure: CARDIOVERSION;  Surgeon: Sanda Klein, MD;  [facility];  Service: Cardiovascular;  Laterality: N/A;  . CATARACT EXTRACTION      . COLONOSCOPY   09/20/2012    Dr. [name]: multiple tubular adenomas   . COLONOSCOPY   2005    Dr. [name]. Polyps, path unknown   . COLONOSCOPY N/A 07/24/2016    Procedure: COLONOSCOPY;  Surgeon: [name], MD;  [facility];  Service: Endoscopy;  Laterality: N/A;  9:00 am  .  ESOPHAGOGASTRODUODENOSCOPY   09/20/2012    Dr. [name]: duodenal erosion and possible resolving ulcer at the angularis   . ESOPHAGOGASTRODUODENOSCOPY N/A 03/24/2018    Procedure: ESOPHAGOGASTRODUODENOSCOPY (EGD);  Surgeon: [name], MD;  [facility];  Service: Endoscopy;  Laterality: N/A;  12:45pm- moved up to arrive at 8:30am per Tretha Sciara  . EYE SURGERY      . IR FLUORO GUIDE CV LINE RIGHT   11/09/2017  . IR US GUIDE VASC ACCESS RIGHT   11/09/2017  . REVISON OF ARTERIOVENOUS FISTULA Left 03/15/2018    Procedure: REVISION OF Left arm BASILIC VEIN TRANSPOSITION;  Surgeon: Angelia Mould, MD;  Location: [place];  Service: Vascular;  Laterality: Left;  . TEE WITHOUT CARDIOVERSION   09/16/2012    Procedure: TRANSESOPHAGEAL ECHOCARDIOGRAM (TEE);  Surgeon: Sanda Klein, MD;  Location: [facility]  ENDOSCOPY;  Service: Cardiovascular;  Laterality: N/A;  . TRANSESOPHAGEAL ECHOCARDIOGRAM WITH CARDIOVERSION   09/16/2012    EF 60-65%, moderate LVH, moderate regurg of the aortic valve, LA moderately dilated           Family History  Problem Relation Age of Onset  . Diabetes Brother    . Diabetes Father    . Hypertension Father    . Hypertension Sister    . Stroke Mother    . Diabetes Sister    . Hypertension Brother    . Colon cancer Neg Hx        Social History:   Married. Husband supportive. Wheelchair dependent due to pain BLE. Niece assists with ADLs. Per reports that she quit smoking about 5 years ago. Her smoking use included cigarettes. She has a 30.00 pack-year smoking history. She has never used smokeless tobacco. She reports that she does not drink alcohol or use drugs.          Allergies  Allergen Reactions  . Ace Inhibitors Cough          Medications Prior to Admission  Medication Sig Dispense Refill  . acetaminophen (TYLENOL) 500 MG tablet Take 1,000 mg by mouth every 6 (six) hours as needed for moderate pain.       Marland Kitchen albuterol (PROVENTIL HFA;VENTOLIN HFA) 108 (90 BASE) MCG/ACT inhaler Inhale 2 puffs into the lungs every 6 (six) hours as needed for shortness of breath.       . allopurinol (ZYLOPRIM) 100 MG tablet Take 100 mg by mouth every Monday, Wednesday, and Friday.       . Amino Acids-Protein Hydrolys (FEEDING SUPPLEMENT, PRO-STAT SUGAR FREE 64,) LIQD Take 30 mLs by mouth 2 (two) times daily. 900 mL 0  . apixaban (ELIQUIS) 5 MG TABS tablet Take 1 tablet (5 mg total) by mouth 2 (two) times daily. 60 tablet 6  . atorvastatin (LIPITOR) 20 MG tablet Take 20 mg by mouth at bedtime.       . brimonidine-timolol (COMBIGAN) 0.2-0.5 % ophthalmic solution Place 1 drop into the left eye 2 (two) times daily.       Marland Kitchen dicyclomine (BENTYL) 10 MG capsule Take 10 mg by mouth 4 (four) times daily -  before meals and at bedtime.      . fentaNYL (DURAGESIC - DOSED MCG/HR) 50 MCG/HR  Place 50 mcg onto the skin every 3 (three) days.       . folic acid (FOLVITE) 1 MG tablet Take 1 mg by mouth daily.      . folic acid-vitamin b complex-vitamin c-selenium-zinc (DIALYVITE) 3 MG TABS tablet Take 1 tablet by mouth  daily.      . hydroxychloroquine (PLAQUENIL) 200 MG tablet Take 200 mg by mouth daily.       . Insulin Detemir (LEVEMIR FLEXTOUCH) 100 UNIT/ML Pen Inject 10 Units into the skin every morning.      . insulin lispro (HUMALOG KWIKPEN) 100 UNIT/ML KiwkPen Inject 5 Units into the skin 3 (three) times daily with meals as needed (*Only take if blood sugar levels are over 100).       . metoprolol tartrate (LOPRESSOR) 50 MG tablet Take 50 mg by mouth See admin instructions. Take 9m twice a day, except on Tuesday, Thursday, Saturday only take once daily.      .Marland KitchenoxyCODONE-acetaminophen (PERCOCET/ROXICET) 5-325 MG tablet Take 1 tablet by mouth every 6 (six) hours as needed for severe pain. 20 tablet 0  . pantoprazole (PROTONIX) 40 MG tablet Take 1 tablet (40 mg total) by mouth daily. 30 tablet 11  . predniSONE (DELTASONE) 5 MG tablet Take 5 mg by mouth daily with breakfast.      . travoprost, benzalkonium, (TRAVATAN) 0.004 % ophthalmic solution Place 1 drop into both eyes at bedtime.       .Marland Kitchenalbuterol (ACCUNEB) 0.63 MG/3ML nebulizer solution Take 1 ampule by nebulization every 6 (six) hours as needed for wheezing or shortness of breath.           Drug Regimen Review  Drug regimen was reviewed and remains appropriate with no significant issues identified   Home: Home Living Family/patient expects to be discharged to:: Private residence Living Arrangements: Spouse/significant other Available Help at Discharge: Family, Available 24 hours/day Type of Home: House Home Access: Ramped entrance Home Layout: Two level Alternate Level Stairs-Number of Steps: 6 Alternate Level Stairs-Rails: Left Bathroom Shower/Tub: WMultimedia programmer Standard Bathroom Accessibility:  Yes Home Equipment: Cane - single point, WEnvironmental consultant- 2 wheels, Wheelchair - manual, Shower seat, Bedside commode, Other (comment) Additional Comments: Per pt, husband in good physical condition   Functional History: Prior Function Level of Independence: Needs assistance Gait / Transfers Assistance Needed: Pt has not walked in the past few months due to BLE pain, husband assists with transfers to/from wheelchair and recliner. Stair lift to 2nd floor bedroom ADL's / Homemaking Assistance Needed: Niece and/or husband assist with bathing, household tasks Comments: Reports vision as "fair", able to watch tv. Enjoys playing Monopoly with husband   Functional Status:  Mobility: Bed Mobility Overal bed mobility: Needs Assistance Bed Mobility: Supine to Sit Supine to sit: Mod assist, HOB elevated General bed mobility comments: pt up in recliner upon OT arrival Transfers Overall transfer level: Needs assistance Equipment used: 1 person hand held assist Transfers: Lateral/Scoot Transfers  Lateral/Scoot Transfers: Max assist General transfer comment: NT pt declined to attempt SPT due to L foot pain, will need lateral scoot max A per PT note   ADL: ADL Overall ADL's : Needs assistance/impaired Eating/Feeding: Set up, Sitting Grooming: Min guard, Sitting Upper Body Bathing: Min guard, Sitting Lower Body Bathing: Maximal assistance, Sitting/lateral leans Upper Body Dressing : Min guard, Sitting Lower Body Dressing: Total assistance Toilet Transfer Details (indicate cue type and reason): pt declined due to L foot pain and fatigue Toileting- Clothing Manipulation and Hygiene: Total assistance General ADL Comments: pt tearful   Cognition: Cognition Overall Cognitive Status: Impaired/Different from baseline Orientation Level: Oriented X4 Cognition Arousal/Alertness: Awake/alert Behavior During Therapy: WFL for tasks assessed/performed Overall Cognitive Status: Impaired/Different from  baseline Area of Impairment: Orientation, Attention, Memory, Following commands, Safety/judgement, Awareness, Problem solving  Orientation Level: Disoriented to, Time Memory: Decreased short-term memory Following Commands: Follows one step commands consistently, Follows multi-step commands inconsistently Safety/Judgement: Decreased awareness of deficits Awareness: Intellectual Problem Solving: Slow processing, Difficulty sequencing, Requires verbal cues General Comments: Pt initially disoriented today; reoriented, and when asked by RN later, able to recall. Pt aware of short-term memory deficits. Intermittently emotional/crying about situation (i.e. food not being delivered) but able to be redirected     Blood pressure (!) 141/51, pulse 79, temperature 98.4 F (36.9 C), temperature source Oral, resp. rate 20, weight 65.4 kg, SpO2 100 %. Physical Exam  Nursing note and vitals reviewed. Constitutional: She has a sickly appearance.  Musculoskeletal:  LLE with multiple healed ulcers and coban dressing on forefoot. R-AKA sensitive to touch--dry dressing with stocking in place.   Neurological: She is alert.  Dysconjugate gaze. Slow to answer and mildly disoriented but able to self correct with minimal cues. Able to follow simple motor commands.    oriented to person "Forestine Na", not time General: No acute distress Mood and affect are appropriate Heart: Regular rate and rhythm no rubs murmurs or extra sounds, HD cath Right subclavian Lungs: Clear to auscultation, breathing unlabored, no rales or wheezes Abdomen: Positive bowel sounds, soft nontender to palpation, nondistended Extremities: No clubbing, cyanosis, or edema, Skin: No evidence of breakdown, no evidence of rash Neurologic:  motor strength is 5/5 in bilateral deltoid, bicep, tricep, grip, hip flexor, 4/5 L knee extensors, ankle dorsiflexor and plantar flexor, RLE NT d/t AKA Sensory exam normal sensation to light touch and  proprioception in bilateral upper and lower extremities Cerebellar exam normal finger to nose to finger Musculoskeletal: Full range of motion in all 4 extremities.Right AKA with dressing, Left forefoot coban wrap   Lab Results Last 48 Hours        Results for orders placed or performed during the hospital encounter of 06/08/18 (from the past 48 hour(s))  Basic metabolic panel     Status: Abnormal    Collection Time: 06/09/18  3:44 AM  Result Value Ref Range    Sodium 136 135 - 145 mmol/L    Potassium 3.9 3.5 - 5.1 mmol/L      Comment: NO VISIBLE HEMOLYSIS    Chloride 98 98 - 111 mmol/L    CO2 25 22 - 32 mmol/L    Glucose, Bld 191 (H) 70 - 99 mg/dL    BUN 30 (H) 8 - 23 mg/dL    Creatinine, Ser 3.00 (H) 0.44 - 1.00 mg/dL      Comment: DELTA CHECK NOTED    Calcium 7.7 (L) 8.9 - 10.3 mg/dL    GFR calc non Af Amer 15 (L) >60 mL/min    GFR calc Af Amer 17 (L) >60 mL/min      Comment: (NOTE) The eGFR has been calculated using the CKD EPI equation. This calculation has not been validated in all clinical situations. eGFR's persistently <60 mL/min signify possible Chronic Kidney Disease.      Anion gap 13 5 - 15      Comment: Performed at [facility] [address], [place] [address]  CBC     Status: Abnormal    Collection Time: 06/09/18  3:44 AM  Result Value Ref Range    WBC 12.0 (H) 4.0 - 10.5 K/uL    RBC 2.91 (L) 3.87 - 5.11 MIL/uL    Hemoglobin 7.7 (L) 12.0 - 15.0 g/dL    HCT 26.8 (L) 36.0 - 46.0 %  MCV 92.1 78.0 - 100.0 fL    MCH 26.5 26.0 - 34.0 pg    MCHC 28.7 (L) 30.0 - 36.0 g/dL    RDW 18.1 (H) 11.5 - 15.5 %    Platelets 276 150 - 400 K/uL      Comment: Performed at [facility] [address], [place], [place] [address]  Hemoglobin A1c     Status: None    Collection Time: 06/09/18  3:44 AM  Result Value Ref Range    Hgb A1c MFr Bld 5.4 4.8 - 5.6 %      Comment: (NOTE) Pre diabetes:          5.7%-6.4% Diabetes:              >6.4% Glycemic  control for   <7.0% adults with diabetes      Mean Plasma Glucose 108.28 mg/dL      Comment: Performed at [facility] [address] [place], [place] [address]  Glucose, capillary     Status: Abnormal    Collection Time: 06/09/18  6:26 AM  Result Value Ref Range    Glucose-Capillary 167 (H) 70 - 99 mg/dL  Type and screen [place]     Status: None (Preliminary result)    Collection Time: 06/09/18  8:39 AM  Result Value Ref Range    ABO/RH(D) A POS      Antibody Screen NEG      Sample Expiration 06/12/2018      Unit Number [id number]      Blood Component Type RED CELLS,LR      Unit division 00      Status of Unit ALLOCATED      Transfusion Status OK TO TRANSFUSE      Crossmatch Result          Compatible Performed at [facility], [place] [address] [place],  [address]      Unit Number [id number]      Blood Component Type RED CELLS,LR      Unit division 00      Status of Unit ALLOCATED      Transfusion Status OK TO TRANSFUSE      Crossmatch Result Compatible    Prepare RBC     Status: None    Collection Time: 06/09/18  8:39 AM  Result Value Ref Range    Order Confirmation          ORDER PROCESSED BY BLOOD BANK Performed at [facility], [place] [address] [place], [place] [address]    Glucose, capillary     Status: Abnormal    Collection Time: 06/09/18 11:56 AM  Result Value Ref Range    Glucose-Capillary 134 (H) 70 - 99 mg/dL  Glucose, capillary     Status: Abnormal    Collection Time: 06/09/18  4:13 PM  Result Value Ref Range    Glucose-Capillary 231 (H) 70 - 99 mg/dL    Comment 1 Notify RN      Comment 2 Document in Chart    Glucose, capillary     Status: Abnormal    Collection Time: 06/09/18  9:00 PM  Result Value Ref Range    Glucose-Capillary 245 (H) 70 - 99 mg/dL  Basic metabolic panel     Status: Abnormal    Collection Time: 06/10/18  4:09 AM  Result Value Ref Range    Sodium 135 135 - 145 mmol/L    Potassium  3.7 3.5 -  5.1 mmol/L    Chloride 100 98 - 111 mmol/L    CO2 24 22 - 32 mmol/L    Glucose, Bld 174 (H) 70 - 99 mg/dL    BUN 47 (H) 8 - 23 mg/dL    Creatinine, Ser 4.38 (H) 0.44 - 1.00 mg/dL    Calcium 7.8 (L) 8.9 - 10.3 mg/dL    GFR calc non Af Amer 9 (L) >60 mL/min    GFR calc Af Amer 11 (L) >60 mL/min      Comment: (NOTE) The eGFR has been calculated using the CKD EPI equation. This calculation has not been validated in all clinical situations. eGFR's persistently <60 mL/min signify possible Chronic Kidney Disease.      Anion gap 11 5 - 15      Comment: Performed at [facility] [address] [place],  [address]  CBC     Status: Abnormal    Collection Time: 06/10/18  4:09 AM  Result Value Ref Range    WBC 9.4 4.0 - 10.5 K/uL    RBC 3.09 (L) 3.87 - 5.11 MIL/uL    Hemoglobin 8.3 (L) 12.0 - 15.0 g/dL    HCT 29.4 (L) 36.0 - 46.0 %    MCV 95.1 78.0 - 100.0 fL    MCH 26.9 26.0 - 34.0 pg    MCHC 28.2 (L) 30.0 - 36.0 g/dL    RDW 18.3 (H) 11.5 - 15.5 %    Platelets 252 150 - 400 K/uL      Comment: Performed at [facility], [place] [address] [place], [place] [address]  Glucose, capillary     Status: Abnormal    Collection Time: 06/10/18  6:19 AM  Result Value Ref Range    Glucose-Capillary 168 (H) 70 - 99 mg/dL  Glucose, capillary     Status: Abnormal    Collection Time: 06/10/18 11:42 AM  Result Value Ref Range    Glucose-Capillary 112 (H) 70 - 99 mg/dL    Comment 1 Notify RN      Comment 2 Document in Chart        Imaging Results (Last 48 hours)  No results found.           Medical Problem List and Plan: 1.  decline in functional mobility and ADLs  secondary to Right Above knee amputation 2.  H/o DVT/ A fib/anticoagulation: Pharmaceutical: Other (comment)--Eliquis.  3. Chronic Pain Management: Now on fentanyl  50 mcg (100 mcg PTA?) with prn oxycodone.  4. Mood: LCSW to follow for evaluation and support.  Seems to be accepting of the decision for  R-AKA. VVS feels that patient will likely need surgical intervention in the future. . 5. Neuropsych: This patient is not fully capable of making decisions on her own behalf. 6. Skin/Wound Care: Continue nephro/prostat supplements to promote healing.  Monitor wounds for healing.  7. Fluids/Electrolytes/Nutrition: Daily weights with strict I/O. Labs with HD. 8. T2DM with neuropathy and retionpathy: Was on levemir 10 units daily--resume 5 units daily. Monitor BS ac/hs. Continue SSI for tighter control.  9. PVD with left foot wound: Question due to calciphylaxis v/s pyoderma gangrenosum. compressive dressing on left foot wounds.   10. A fib/Aflutter: Monitor heart rate twice daily.  On Eliquis and beta-blocker twice daily 11. SLE: Continue plaquenil and low dose prednisone.  12. ESRD: Schedule HD on TTS at end of day to help with tolerance of therapy.  13. Anemia of chronic disease:Arransep weekly started on  9/12.  14. H/o Glaucoma: Stable on Timpotic and Xalatan.  15. H/o gastritis/erosive gastropathy: Per EGD 6/26. Continue PPI. Serial H/H monitored with HD.       Post Admission Physician Evaluation: 1. Functional deficits secondary  to Right AKA. 2. Patient admitted to receive collaborative, interdisciplinary care between the physiatrist, rehab nursing staff, and therapy team. 3. Patient's level of medical complexity and substantial therapy needs in context of that medical necessity cannot be provided at a lesser intensity of care. 4. Patient has experienced substantial functional loss from his/her baseline. Judging by the patient's diagnosis, physical exam, and functional history, the patient has potential for functional progress which will result in measurable gains while on inpatient rehab.  These gains will be of substantial and practical use upon discharge in facilitating mobility and self-care at the household level. 5. Physiatrist will provide 24 hour management of medical needs as well as  oversight of the therapy plan/treatment and provide guidance as appropriate regarding the interaction of the two. 6. 24 hour rehab nursing will assist in the management of  bladder management, bowel management, safety, skin/wound care, disease management, medication administration, pain management and patient education  and help integrate therapy concepts, techniques,education, etc. 7. PT will assess and treat for pre gait, gait training, endurance , safety, equipment, neuromuscular re education:  .  Goals are: independent with assistive device. 8. OT will assess and treat for ADLs, Cognitive perceptual skills, Neuromuscular re education, safety, endurance, equipment  .  Goals are: independent with assistive device.  9. SLP will assess and treat for  .  Goals are: supervision. 10. Case Management and Social Worker will assess and treat for psychological issues and discharge planning. 11. Team conference will be held weekly to assess progress toward goals and to determine barriers to discharge. 12.  Patient will receive at least 3 hours of therapy per day at least 5 days per week. 13. ELOS and Prognosis: 10-14d good  "I have personally performed a face to face diagnostic evaluation of this patient.  Additionally, I have reviewed and concur with the physician assistant's documentation above."  Charlett Blake M.D. [facility] FAAPM&R (Sports Med, Neuromuscular Med) Diplomate Am Board of [place], PA-C 06/10/2018

## 2018-06-10 NOTE — Progress Notes (Signed)
Patient to dialysis at this time.

## 2018-06-10 NOTE — Progress Notes (Signed)
Inpatient Rehabilitation-Admissions Coordinator   [facility] has received medical approval and insurance approval for admit to CIR today. AC has updated pt's family, floor RN, CM and SW regarding plan.   AC has also spoken with charge nurse in dialysis, Almyra Brace, and communicated that pt will be admitted to CIR today.   Please call if questions.   [name], OTR/L  Rehab Admissions Coordinator  [phone] 06/10/2018 5:17 PM

## 2018-06-10 NOTE — Progress Notes (Signed)
Patient ID: [name], female   DOB: 1946-07-06, 72 y.o.   MRN: [id number] Patient arrived from HD and 4E with RN and patient belongings. Patient oriented to room, nurse call system, bed alarm, fall prevention plan, rehab safety plan, and rehab schedule. Patient resting comfortably in bed with bed alarm on and call bell at side.

## 2018-06-10 NOTE — Progress Notes (Signed)
PMR Admission Coordinator Pre-Admission Assessment  Patient: [name] is an 72 y.o., female MRN: [id number] DOB: 18-Oct-1945 Height:   Weight: 65.4 kg                                                                                                                                                  Insurance Information HMO: Yes    PPO:      PCP:      IPA:      80/20:      OTHER:  PRIMARY: U[employer] Medicare      Policy#: [id number]      Subscriber: Patient CM Name: Vevelyn Royals      Phone#: [phone]     Fax#: [phone] Pre-Cert#: [id number]      Employer:  Benefits:  Phone #: NA     Name: [name].com Eff. Date: 06/10/2018     Deduct: $0      Out of Pocket Max: $4,400 (met $4,400)      Life Max: NA CIR: $345/day for days 1-5, $0/day for days 6+      SNF: $0/day for days 1-20, $160/day for days 21-48, $0/day for days 49-100. (100 day SNF limit) Outpatient: per necessity     Co-Pay: $40/visit Home Health: 100%  Per necessity    Co-Pay:  DME: 80%     Co-Pay: 20% Providers:  SECONDARY:       Policy#:       Subscriber:  CM Name:       Phone#:      Fax#:  Pre-Cert#:       Employer:  Benefits:  Phone #:      Name:  Eff. Date:      Deduct:       Out of Pocket Max:       Life Max:  CIR:       SNF:  Outpatient:      Co-Pay:  Home Health:       Co-Pay:  DME:      Co-Pay:   Medicaid Application Date:       Case Manager:  Disability Application Date:       Case Worker:   Emergency Contact Information         Contact Information    Name Relation Home [place] [phone]  [phone]     Current Medical History  Patient Admitting Diagnosis: Right BKA History of Present Illness: [name] a 72 y.o.femalewith history of T2DM with nephropathy and retinopathy, COPD, DVT, A. fib/a flutter,bilateral lower extremity wounds with progression of RLE ulcersto calf and anterior leg,inability to walk and significant pain. History taken from  chart review, patient, and wife.Shewas willing to undergo surgical intervention andwas admitted on 06/08/2018 for right AKA by Dr. Oneida Alar. Postop therapy evaluations pending. CIR recommended in anticipation of  extensiverehab needs.  She has been wheelchair dependent for past few months but was able to perform transfers with assist. Sits in her recliner chair most of the day. Niece assist with ADLs.  Pt is to be admitted to CIR on 06/10/18.   Past Medical History      Past Medical History:  Diagnosis Date  . Anemia   . Arthritis   . Asthma   . Atrial fibrillation ([place])   . Atrial flutter [facility])    TEE/DCCV December 2013 - on Xarelto [facility])  . Bone spur of toe    Right 5th toe  . Chronic diastolic CHF (congestive heart failure) (HCC)    a. EF 50-55% by echo in 2015  . CKD (chronic kidney disease) stage 4, GFR 15-29 ml/min (HCC)    a. s/p fistula placement but not on HD. Followed by Nephrology.   . Colon polyposis   . COPD (chronic obstructive pulmonary disease) ([place])   . DVT of upper extremity (deep vein thrombosis) ([place])    Right basilic vein, June 6237  . Essential hypertension, benign   . Fractures   . Glaucoma   . SLE (systemic lupus erythematosus) ([place])   . Type 2 diabetes mellitus ([place])   . UTI (lower urinary tract infection)   . Wound of right leg 06/2017    Family History  family history includes Diabetes in her brother, father, and sister; Hypertension in her brother, father, and sister; Stroke in her mother.  Prior Rehab/Hospitalizations:  Has the patient had major surgery during 100 days prior to admission? No  Current Medications   Current Facility-Administered Medications:  .  0.9 %  sodium chloride infusion (Manually program via Guardrails IV Fluids), , Intravenous, Once, Fields, Jessy Oto, MD .  acetaminophen (TYLENOL) tablet 325-650 mg, 325-650 mg, Oral, Q4H PRN **OR** acetaminophen (TYLENOL) suppository 325-650 mg,  325-650 mg, Rectal, Q4H PRN, Rhyne, Samantha J, PA-C .  albuterol (PROVENTIL) (2.5 MG/3ML) 0.083% nebulizer solution 2.5 mg, 2.5 mg, Nebulization, Q6H PRN, Rhyne, Samantha J, PA-C .  allopurinol (ZYLOPRIM) tablet 100 mg, 100 mg, Oral, Q M,W,F, Rhyne, Samantha J, PA-C, 100 mg at 06/09/18 1104 .  alum & mag hydroxide-simeth (MAALOX/MYLANTA) 200-200-20 MG/5ML suspension 15-30 mL, 15-30 mL, Oral, Q2H PRN, Rhyne, Samantha J, PA-C .  apixaban (ELIQUIS) tablet 5 mg, 5 mg, Oral, BID, Rhyne, Samantha J, PA-C, 5 mg at 06/10/18 0941 .  atorvastatin (LIPITOR) tablet 20 mg, 20 mg, Oral, QHS, Rhyne, Samantha J, PA-C, 20 mg at 06/09/18 2119 .  brimonidine (ALPHAGAN) 0.2 % ophthalmic solution 1 drop, 1 drop, Left Eye, BID, 1 drop at 06/10/18 0942 **AND** timolol (TIMOPTIC) 0.5 % ophthalmic solution 1 drop, 1 drop, Left Eye, BID, Elam Dutch, MD, 1 drop at 06/10/18 0943 .  Chlorhexidine Gluconate Cloth 2 % PADS 6 each, 6 each, Topical, Q0600, Loren Racer, PA-C, 6 each at 06/10/18 [phone] .  Darbepoetin Alfa (ARANESP) injection 200 mcg, 200 mcg, Intravenous, Q Thu-HD, Deterding, James, MD .  dicyclomine (BENTYL) capsule 10 mg, 10 mg, Oral, TID AC & HS, Rhyne, Samantha J, PA-C, 10 mg at 06/10/18 1249 .  docusate sodium (COLACE) capsule 100 mg, 100 mg, Oral, Daily, Rhyne, Samantha J, PA-C, 100 mg at 06/10/18 0940 .  feeding supplement (NEPRO CARB STEADY) liquid 237 mL, 237 mL, Oral, TID WC, Rhyne, Samantha J, PA-C .  feeding supplement (PRO-STAT SUGAR FREE 64) liquid 30 mL, 30 mL, Oral, BID, Rhyne, Samantha J, PA-C, 30 mL at 06/10/18 0943 .  fentaNYL (DURAGESIC - dosed mcg/hr) patch 50 mcg, 50 mcg, Transdermal, Q72H, Rhyne, Samantha J, PA-C .  folic acid (FOLVITE) tablet 1 mg, 1 mg, Oral, Daily, Rhyne, Samantha J, PA-C, 1 mg at 06/10/18 0941 .  guaiFENesin-dextromethorphan (ROBITUSSIN DM) 100-10 MG/5ML syrup 15 mL, 15 mL, Oral, Q4H PRN, Rhyne, Samantha J, PA-C .  hydrALAZINE (APRESOLINE) injection 5 mg, 5 mg,  Intravenous, Q20 Min PRN, Rhyne, Samantha J, PA-C .  hydroxychloroquine (PLAQUENIL) tablet 200 mg, 200 mg, Oral, Daily, Rhyne, Samantha J, PA-C, 200 mg at 06/10/18 0940 .  insulin aspart (novoLOG) injection 0-15 Units, 0-15 Units, Subcutaneous, TID WC, Rhyne, Samantha J, PA-C, 3 Units at 06/10/18 0651 .  labetalol (NORMODYNE,TRANDATE) injection 10 mg, 10 mg, Intravenous, Q10 min PRN, Rhyne, Samantha J, PA-C .  latanoprost (XALATAN) 0.005 % ophthalmic solution 1 drop, 1 drop, Both Eyes, QHS, Fields, Jessy Oto, MD, 1 drop at 06/09/18 2120 .  magnesium sulfate IVPB 2 g 50 mL, 2 g, Intravenous, Daily PRN, Rhyne, Samantha J, PA-C .  metoprolol tartrate (LOPRESSOR) injection 2-5 mg, 2-5 mg, Intravenous, Q2H PRN, Rhyne, Samantha J, PA-C .  metoprolol tartrate (LOPRESSOR) tablet 50 mg, 50 mg, Oral, Once per day on Tue Thu Sat, Fields, Charles E, MD, 50 mg at 06/08/18 2305 .  metoprolol tartrate (LOPRESSOR) tablet 50 mg, 50 mg, Oral, 2 times per day on Sun Mon Wed Fri, Fields, Jessy Oto, MD, 50 mg at 06/09/18 2128 .  morphine 2 MG/ML injection 2 mg, 2 mg, Intravenous, Q2H PRN, Rhyne, Samantha J, PA-C, 2 mg at 06/09/18 2125 .  multivitamin (RENA-VIT) tablet 1 tablet, 1 tablet, Oral, QHS, Elam Dutch, MD, 1 tablet at 06/09/18 2119 .  ondansetron (ZOFRAN) injection 4 mg, 4 mg, Intravenous, Q6H PRN, Rhyne, Samantha J, PA-C .  oxyCODONE-acetaminophen (PERCOCET/ROXICET) 5-325 MG per tablet 1 tablet, 1 tablet, Oral, Q6H PRN, Rhyne, Samantha J, PA-C, 1 tablet at 06/10/18 1249 .  pantoprazole (PROTONIX) EC tablet 40 mg, 40 mg, Oral, Daily, Rhyne, Samantha J, PA-C, 40 mg at 06/10/18 0940 .  phenol (CHLORASEPTIC) mouth spray 1 spray, 1 spray, Mouth/Throat, PRN, Rhyne, Samantha J, PA-C .  polyethylene glycol (MIRALAX / GLYCOLAX) packet 17 g, 17 g, Oral, Daily PRN, Rhyne, Samantha J, PA-C .  predniSONE (DELTASONE) tablet 5 mg, 5 mg, Oral, Q breakfast, Rhyne, Samantha J, PA-C, 5 mg at 06/10/18 8127  Patients  Current Diet:     Diet Order                  Diet renal/carb modified with fluid restriction Diet-HS Snack? Nothing; Fluid restriction: 1200 mL Fluid; Room service appropriate? Yes; Fluid consistency: Thin  Diet effective now               Precautions / Restrictions Precautions Precautions: Fall, Other (comment) Precaution Comments: R AKA, L foot wounds Restrictions Weight Bearing Restrictions: Yes RLE Weight Bearing: Non weight bearing   Has the patient had 2 or more falls or a fall with injury in the past year?Yes  Prior Activity Level Limited Community (1-2x/wk): will go out for HD and wound care with husband; use of wc for [place] / Equipment Home Equipment: Kasandra Knudsen - single point, Environmental consultant - 2 wheels, Wheelchair - manual, Shower seat, Bedside commode, Other (comment)  Prior Device Use: Indicate devices/aids used by the patient prior to current illness, exacerbation or injury? Manual wheelchair  Prior Functional Level Prior Function Level of Independence: Needs assistance Gait / Transfers Assistance  Needed: Pt has not walked in the past few months due to BLE pain, husband assists with transfers to/from wheelchair and recliner. Stair lift to 2nd floor bedroom ADL's / Homemaking Assistance Needed: Niece and/or husband assist with bathing, household tasks Comments: Reports vision as "fair", able to watch tv. Enjoys playing Monopoly with husband  Self Care: Did the patient need help bathing, dressing, using the toilet or eating?  Needed some help  Indoor Mobility: Did the patient need assistance with walking from room to room (with or without device)? Dependent; used wheelchair for mobility.   Stairs: Did the patient need assistance with internal or external stairs (with or without device)? Dependent  Functional Cognition: Did the patient need help planning regular tasks such as shopping or remembering to take medications? Needed some  help  Current Functional Level Cognition  Overall Cognitive Status: Impaired/Different from baseline Orientation Level: Oriented X4 Following Commands: Follows one step commands consistently, Follows multi-step commands inconsistently Safety/Judgement: Decreased awareness of deficits General Comments: Pt initially disoriented today; reoriented, and when asked by RN later, able to recall. Pt aware of short-term memory deficits. Intermittently emotional/crying about situation (i.e. food not being delivered) but able to be redirected    Extremity Assessment (includes Sensation/Coordination)  Upper Extremity Assessment: Generalized weakness  Lower Extremity Assessment: Defer to PT evaluation RLE Deficits / Details: s/p R AKA; hip flex grossly 3/5, limited due to pain RLE: Unable to fully assess due to pain LLE Deficits / Details: L foot wounds bandaged; grossly 3/5 throughout    ADLs  Overall ADL's : Needs assistance/impaired Eating/Feeding: Set up, Sitting Grooming: Min guard, Sitting Upper Body Bathing: Min guard, Sitting Lower Body Bathing: Maximal assistance, Sitting/lateral leans Upper Body Dressing : Min guard, Sitting Lower Body Dressing: Total assistance Toilet Transfer Details (indicate cue type and reason): pt declined due to L foot pain and fatigue Toileting- Clothing Manipulation and Hygiene: Total assistance General ADL Comments: pt tearful    Mobility  Overal bed mobility: Needs Assistance Bed Mobility: Supine to Sit Supine to sit: Mod assist, HOB elevated General bed mobility comments: pt up in recliner upon OT arrival    Transfers  Overall transfer level: Needs assistance Equipment used: 1 person hand held assist Transfers: Lateral/Scoot Transfers  Lateral/Scoot Transfers: Max assist General transfer comment: NT pt declined to attempt SPT due to L foot pain, will need lateral scoot max A per PT note    Ambulation / Gait / Stairs / Wheelchair  Mobility       Posture / Balance Balance Overall balance assessment: Needs assistance Sitting balance-Leahy Scale: Fair Standing balance comment: NT    Special needs/care consideration BiPAP/CPAP: no CPM: no Continuous Drip IV: magnesium sulfate IVPB  Dialysis: yes        Days: Tue, Thurs, Sat Life Vest: no Oxygen: no Special Bed: no, pt sleeps in recliner at home Trach Size: no Wound Vac (area): no      Location: no Skin:  Right LE surgical incision (AKA); wounds to plantar surface of L foot                             Bowel mgmt:06/09/18 Bladder mgmt: oliguria  Diabetic mgmt: yes     Previous Home Environment Living Arrangements: Spouse/significant other Available Help at Discharge: Family, Available 24 hours/day Type of Home: House Home Layout: Two level Alternate Level Stairs-Rails: Left Alternate Level Stairs-Number of Steps: 6 Home Access: Ramped entrance Bathroom  Shower/Tub: Engineer, mining: Yes Additional Comments: Per pt, husband in good physical condition  Discharge Living Setting Plans for Discharge Living Setting: Patient's home, Lives with (comment), House(lives with husband; neice michelle helps with ADLs) Type of Home at Discharge: House Discharge Home Layout: Two level, Able to live on main level with bedroom/bathroom(split level; bathrooms upstairs; uses BSC downstairs) Alternate Level Stairs-Rails: None(unknown -pt does not use) Alternate Level Stairs-Number of Steps: unknown-pt does not use Discharge Home Access: Ramped entrance Discharge Bathroom Shower/Tub: None(pt sponge bathes; no bathroom downstairs) Discharge Bathroom Toilet: Handicapped height(uses BSC next to recliner) Discharge Bathroom Accessibility: No(bathrooms only upstairs) Does the patient have any problems obtaining your medications?: No  Social/Family/Support Systems Patient Roles: Spouse Contact Information: husband is  emergency contact  Anticipated Caregiver: husband: Izell [place] [phone]; [phone]) Anticipated Caregiver's Contact Information: see above; niece Sharyn Lull also assists (but does work job from [employer]) Ability/Limitations of Caregiver: Min/Mod A for transfers Caregiver Availability: 24/7 Discharge Plan Discussed with Primary Caregiver: Yes(with pt and husband) Is Caregiver In Agreement with Plan?: Yes Does Caregiver/Family have Issues with Lodging/Transportation while Pt is in Rehab?: No   Goals/Additional Needs Patient/Family Goal for Rehab: PT/OT: Min/Mod A from wheelchair level; SLP: NA Expected length of stay: 12-16 days Cultural Considerations: NA Dietary Needs: renal/carb modified diet with fluid restriction: 1219m fluid restriction; thin liquids Equipment Needs: TBD Special Service Needs: wound care for LLE Additional Information: family needs training on transfer technique Pt/Family Agrees to Admission and willing to participate: Yes(pt and husband) Program Orientation Provided & Reviewed with Pt/Caregiver Including Roles  & Responsibilities: Yes(pt and husband)  Barriers to Discharge: Hemodialysis, Lack of/limited family support, Wound Care(family will need trainng to reduce caregiver effort)   Decrease burden of Care through IP rehab admission: Other family would like education to improve transfer technique   Possible need for SNF placement upon discharge: Not anticipated; pt has 24/7 support at home with good prognosis to progress back to PLOF.    Patient Condition: This patient's condition remains as documented in the consult dated 06/09/18, in which the Rehabilitation Physician determined and documented that the patient's condition is appropriate for intensive rehabilitative care in an inpatient rehabilitation facility pending medical stability and tolerance. These areas have been addressed as pt's attending has provided approval for CIR and pt in  agreement for intensive therapy with pt performing max scoot transfers with Max A and hopeful for ambulation with prosthetic in the future. Will admit to inpatient rehab today.  Preadmission Screen Completed By:  KJhonnie Garner 06/10/2018 4:44 PM ______________________________________________________________________   Discussed status with Dr. KLetta Pateon 06/10/18 at 4:41PM  and received telephone approval for admission today.  Admission Coordinator:  KJhonnie Garner time 4:41 PM /Sudie Grumbling9/12/19.            Cosigned by: KCharlett Blake MD at 06/10/2018 4:59 PM  Revision History

## 2018-06-10 NOTE — Progress Notes (Addendum)
  Progress Note    06/10/2018 7:45 AM 2 Days Post-Op  Subjective:  Soreness R AKA overnight  Vitals:   06/09/18 1943 06/10/18 0454  BP: (!) 160/58 (!) 154/67  Pulse: 75 90  Resp: 15 18  Temp: 97.7 F (36.5 C) 98.1 F (36.7 C)  SpO2:  96%    Physical Exam: Incisions:  Skin edges approximated well with staples in place; skin edges and corners are viable; no fluctuance; no drainage or bleeding; minimal stump edema   CBC    Component Value Date/Time   WBC 9.4 06/10/2018 0409   RBC 3.09 (L) 06/10/2018 0409   HGB 8.3 (L) 06/10/2018 0409   HGB 8.4 (L) 07/17/2017 1025   HCT 29.4 (L) 06/10/2018 0409   HCT 24.9 (L) 12/23/2017 1527   PLT 252 06/10/2018 0409   MCV 95.1 06/10/2018 0409   MCH 26.9 06/10/2018 0409   MCHC 28.2 (L) 06/10/2018 0409   RDW 18.3 (H) 06/10/2018 0409   LYMPHSABS 0.8 03/01/2018 1423   MONOABS 0.6 03/01/2018 1423   EOSABS 0.1 03/01/2018 1423   BASOSABS 0.0 03/01/2018 1423    BMET    Component Value Date/Time   NA 135 06/10/2018 0409   NA 136 05/05/2018 1150   K 3.7 06/10/2018 0409   CL 100 06/10/2018 0409   CO2 24 06/10/2018 0409   GLUCOSE 174 (H) 06/10/2018 0409   BUN 47 (H) 06/10/2018 0409   BUN 50 (H) 05/05/2018 1150   CREATININE 4.38 (H) 06/10/2018 0409   CALCIUM 7.8 (L) 06/10/2018 0409   CALCIUM 7.0 (L) 03/16/2014 0835   GFRNONAA 9 (L) 06/10/2018 0409   GFRAA 11 (L) 06/10/2018 0409    INR    Component Value Date/Time   INR 1.18 06/08/2018 0752     Intake/Output Summary (Last 24 hours) at 06/10/2018 0745 Last data filed at 06/10/2018 0600 Gross per 24 hour  Intake 480 ml  Output -  Net 480 ml     Assessment/Plan:  72 y.o. female is s/p right above knee amputation  2 Days Post-Op  - Dressing changed this morning; incision unremarkable - AKA stump sock ordered - Dispo: medically ready fro CIR when approved   Dagoberto Ligas, PA-C [facility] [phone] 06/10/2018 7:45 AM  Agree with above.   Medically ready for d/c. Rehab vs SNF vs Home hopefully tomorrow HD today  [name], MD [facility] [place] Office: [phone] Pager: [phone]

## 2018-06-10 NOTE — PMR Pre-admission (Signed)
PMR Admission Coordinator Pre-Admission Assessment  Patient: [name] is an 72 y.o., female MRN: [id number] DOB: [birth year] Height:   Weight: 65.4 kg              Insurance Information HMO: Yes    PPO:      PCP:      IPA:      80/20:      OTHER:  PRIMARY: UHC Medicare      Policy#: [id number]      Subscriber: Patient CM Name: Vevelyn Royals      Phone#: [phone]     Fax#: [phone] Pre-Cert#: [id number]      Employer:  Benefits:  Phone #: NA     Name: [facility] Eff. Date: 06/10/2018     Deduct: $0      Out of Pocket Max: $4,400 (met $4,400)      Life Max: NA CIR: $345/day for days 1-5, $0/day for days 6+      SNF: $0/day for days 1-20, $160/day for days 21-48, $0/day for days 49-100. (100 day SNF limit) Outpatient: per necessity     Co-Pay: $40/visit Home Health: 100%  Per necessity    Co-Pay:  DME: 80%     Co-Pay: 20% Providers:  SECONDARY:       Policy#:       Subscriber:  CM Name:       Phone#:      Fax#:  Pre-Cert#:       Employer:  Benefits:  Phone #:      Name:  Eff. Date:      Deduct:       Out of Pocket Max:       Life Max:  CIR:       SNF:  Outpatient:      Co-Pay:  Home Health:       Co-Pay:  DME:      Co-Pay:   Medicaid Application Date:       Case Manager:  Disability Application Date:       Case Worker:   Emergency Contact Information Contact Information    Name Relation Home Work [place] Spouse [phone]  [phone]     Current Medical History  Patient Admitting Diagnosis: Right BKA History of Present Illness: [name] is a 72 y.o. female with history of T2DM with nephropathy and retinopathy, COPD, DVT, A. fib/a flutter, bilateral lower extremity wounds with progression of RLE ulcers to calf and anterior leg, inability to walk and significant pain.  History taken from chart review, patient, and wife. She was willing to undergo surgical intervention and was admitted on 06/08/2018 for right AKA by Dr. Oneida Alar.  Postop  therapy evaluations pending.  CIR recommended in anticipation of extensive rehab needs.   She has been wheelchair dependent for past few months but was able to perform transfers with assist. Sits in her recliner chair most of the day. Niece assist with ADLs.   Pt is to be admitted to CIR on 06/10/18.       Past Medical History  Past Medical History:  Diagnosis Date  . Anemia   . Arthritis   . Asthma   . Atrial fibrillation ([place])   . Atrial flutter [facility])    TEE/DCCV December 2013 - on Xarelto [facility])  . Bone spur of toe    Right 5th toe  . Chronic diastolic CHF (congestive heart failure) (HCC)    a. EF 50-55% by echo in 2015  .  CKD (chronic kidney disease) stage 4, GFR 15-29 ml/min (HCC)    a. s/p fistula placement but not on HD. Followed by Nephrology.   . Colon polyposis   . COPD (chronic obstructive pulmonary disease) ([place])   . DVT of upper extremity (deep vein thrombosis) ([place])    Right basilic vein, June 3754  . Essential hypertension, benign   . Fractures   . Glaucoma   . SLE (systemic lupus erythematosus) ([place])   . Type 2 diabetes mellitus ([place])   . UTI (lower urinary tract infection)   . Wound of right leg 06/2017    Family History  family history includes Diabetes in her brother, father, and sister; Hypertension in her brother, father, and sister; Stroke in her mother.  Prior Rehab/Hospitalizations:  Has the patient had major surgery during 100 days prior to admission? No  Current Medications   Current Facility-Administered Medications:  .  0.9 %  sodium chloride infusion (Manually program via Guardrails IV Fluids), , Intravenous, Once, Fields, Jessy Oto, MD .  acetaminophen (TYLENOL) tablet 325-650 mg, 325-650 mg, Oral, Q4H PRN **OR** acetaminophen (TYLENOL) suppository 325-650 mg, 325-650 mg, Rectal, Q4H PRN, Rhyne, Samantha J, PA-C .  albuterol (PROVENTIL) (2.5 MG/3ML) 0.083% nebulizer solution 2.5 mg, 2.5 mg, Nebulization, Q6H PRN, Rhyne, Samantha J, PA-C .   allopurinol (ZYLOPRIM) tablet 100 mg, 100 mg, Oral, Q M,W,F, Rhyne, Samantha J, PA-C, 100 mg at 06/09/18 1104 .  alum & mag hydroxide-simeth (MAALOX/MYLANTA) 200-200-20 MG/5ML suspension 15-30 mL, 15-30 mL, Oral, Q2H PRN, Rhyne, Samantha J, PA-C .  apixaban (ELIQUIS) tablet 5 mg, 5 mg, Oral, BID, Rhyne, Samantha J, PA-C, 5 mg at 06/10/18 0941 .  atorvastatin (LIPITOR) tablet 20 mg, 20 mg, Oral, QHS, Rhyne, Samantha J, PA-C, 20 mg at 06/09/18 2119 .  brimonidine (ALPHAGAN) 0.2 % ophthalmic solution 1 drop, 1 drop, Left Eye, BID, 1 drop at 06/10/18 0942 **AND** timolol (TIMOPTIC) 0.5 % ophthalmic solution 1 drop, 1 drop, Left Eye, BID, Elam Dutch, MD, 1 drop at 06/10/18 0943 .  Chlorhexidine Gluconate Cloth 2 % PADS 6 each, 6 each, Topical, Q0600, Loren Racer, PA-C, 6 each at 06/10/18 [phone] .  Darbepoetin Alfa (ARANESP) injection 200 mcg, 200 mcg, Intravenous, Q Thu-HD, Deterding, James, MD .  dicyclomine (BENTYL) capsule 10 mg, 10 mg, Oral, TID AC & HS, Rhyne, Samantha J, PA-C, 10 mg at 06/10/18 1249 .  docusate sodium (COLACE) capsule 100 mg, 100 mg, Oral, Daily, Rhyne, Samantha J, PA-C, 100 mg at 06/10/18 0940 .  feeding supplement (NEPRO CARB STEADY) liquid 237 mL, 237 mL, Oral, TID WC, Rhyne, Samantha J, PA-C .  feeding supplement (PRO-STAT SUGAR FREE 64) liquid 30 mL, 30 mL, Oral, BID, Rhyne, Samantha J, PA-C, 30 mL at 06/10/18 0943 .  fentaNYL (DURAGESIC - dosed mcg/hr) patch 50 mcg, 50 mcg, Transdermal, Q72H, Rhyne, Samantha J, PA-C .  folic acid (FOLVITE) tablet 1 mg, 1 mg, Oral, Daily, Rhyne, Samantha J, PA-C, 1 mg at 06/10/18 0941 .  guaiFENesin-dextromethorphan (ROBITUSSIN DM) 100-10 MG/5ML syrup 15 mL, 15 mL, Oral, Q4H PRN, Rhyne, Samantha J, PA-C .  hydrALAZINE (APRESOLINE) injection 5 mg, 5 mg, Intravenous, Q20 Min PRN, Rhyne, Samantha J, PA-C .  hydroxychloroquine (PLAQUENIL) tablet 200 mg, 200 mg, Oral, Daily, Rhyne, Samantha J, PA-C, 200 mg at 06/10/18 0940 .  insulin  aspart (novoLOG) injection 0-15 Units, 0-15 Units, Subcutaneous, TID WC, Rhyne, Samantha J, PA-C, 3 Units at 06/10/18 0651 .  labetalol (NORMODYNE,TRANDATE) injection 10 mg,  10 mg, Intravenous, Q10 min PRN, Rhyne, Samantha J, PA-C .  latanoprost (XALATAN) 0.005 % ophthalmic solution 1 drop, 1 drop, Both Eyes, QHS, Fields, Jessy Oto, MD, 1 drop at 06/09/18 2120 .  magnesium sulfate IVPB 2 g 50 mL, 2 g, Intravenous, Daily PRN, Rhyne, Samantha J, PA-C .  metoprolol tartrate (LOPRESSOR) injection 2-5 mg, 2-5 mg, Intravenous, Q2H PRN, Rhyne, Samantha J, PA-C .  metoprolol tartrate (LOPRESSOR) tablet 50 mg, 50 mg, Oral, Once per day on Tue Thu Sat, Fields, Charles E, MD, 50 mg at 06/08/18 2305 .  metoprolol tartrate (LOPRESSOR) tablet 50 mg, 50 mg, Oral, 2 times per day on Sun Mon Wed Fri, Fields, Jessy Oto, MD, 50 mg at 06/09/18 2128 .  morphine 2 MG/ML injection 2 mg, 2 mg, Intravenous, Q2H PRN, Rhyne, Samantha J, PA-C, 2 mg at 06/09/18 2125 .  multivitamin (RENA-VIT) tablet 1 tablet, 1 tablet, Oral, QHS, Elam Dutch, MD, 1 tablet at 06/09/18 2119 .  ondansetron (ZOFRAN) injection 4 mg, 4 mg, Intravenous, Q6H PRN, Rhyne, Samantha J, PA-C .  oxyCODONE-acetaminophen (PERCOCET/ROXICET) 5-325 MG per tablet 1 tablet, 1 tablet, Oral, Q6H PRN, Rhyne, Samantha J, PA-C, 1 tablet at 06/10/18 1249 .  pantoprazole (PROTONIX) EC tablet 40 mg, 40 mg, Oral, Daily, Rhyne, Samantha J, PA-C, 40 mg at 06/10/18 0940 .  phenol (CHLORASEPTIC) mouth spray 1 spray, 1 spray, Mouth/Throat, PRN, Rhyne, Samantha J, PA-C .  polyethylene glycol (MIRALAX / GLYCOLAX) packet 17 g, 17 g, Oral, Daily PRN, Rhyne, Samantha J, PA-C .  predniSONE (DELTASONE) tablet 5 mg, 5 mg, Oral, Q breakfast, Rhyne, Samantha J, PA-C, 5 mg at 06/10/18 0211  Patients Current Diet:  Diet Order            Diet renal/carb modified with fluid restriction Diet-HS Snack? Nothing; Fluid restriction: 1200 mL Fluid; Room service appropriate? Yes; Fluid  consistency: Thin  Diet effective now              Precautions / Restrictions Precautions Precautions: Fall, Other (comment) Precaution Comments: R AKA, L foot wounds Restrictions Weight Bearing Restrictions: Yes RLE Weight Bearing: Non weight bearing   Has the patient had 2 or more falls or a fall with injury in the past year?Yes  Prior Activity Level Limited Community (1-2x/wk): will go out for HD and wound care with husband; use of wc for [place] / Equipment Home Equipment: Kasandra Knudsen - single point, Environmental consultant - 2 wheels, Wheelchair - manual, Shower seat, Bedside commode, Other (comment)  Prior Device Use: Indicate devices/aids used by the patient prior to current illness, exacerbation or injury? Manual wheelchair  Prior Functional Level Prior Function Level of Independence: Needs assistance Gait / Transfers Assistance Needed: Pt has not walked in the past few months due to BLE pain, husband assists with transfers to/from wheelchair and recliner. Stair lift to 2nd floor bedroom ADL's / Homemaking Assistance Needed: Niece and/or husband assist with bathing, household tasks Comments: Reports vision as "fair", able to watch tv. Enjoys playing Monopoly with husband  Self Care: Did the patient need help bathing, dressing, using the toilet or eating?  Needed some help  Indoor Mobility: Did the patient need assistance with walking from room to room (with or without device)? Dependent; used wheelchair for mobility.   Stairs: Did the patient need assistance with internal or external stairs (with or without device)? Dependent  Functional Cognition: Did the patient need help planning regular tasks such as shopping or remembering to take  medications? Needed some help  Current Functional Level Cognition  Overall Cognitive Status: Impaired/Different from baseline Orientation Level: Oriented X4 Following Commands: Follows one step commands consistently, Follows  multi-step commands inconsistently Safety/Judgement: Decreased awareness of deficits General Comments: Pt initially disoriented today; reoriented, and when asked by RN later, able to recall. Pt aware of short-term memory deficits. Intermittently emotional/crying about situation (i.e. food not being delivered) but able to be redirected    Extremity Assessment (includes Sensation/Coordination)  Upper Extremity Assessment: Generalized weakness  Lower Extremity Assessment: Defer to PT evaluation RLE Deficits / Details: s/p R AKA; hip flex grossly 3/5, limited due to pain RLE: Unable to fully assess due to pain LLE Deficits / Details: L foot wounds bandaged; grossly 3/5 throughout    ADLs  Overall ADL's : Needs assistance/impaired Eating/Feeding: Set up, Sitting Grooming: Min guard, Sitting Upper Body Bathing: Min guard, Sitting Lower Body Bathing: Maximal assistance, Sitting/lateral leans Upper Body Dressing : Min guard, Sitting Lower Body Dressing: Total assistance Toilet Transfer Details (indicate cue type and reason): pt declined due to L foot pain and fatigue Toileting- Clothing Manipulation and Hygiene: Total assistance General ADL Comments: pt tearful    Mobility  Overal bed mobility: Needs Assistance Bed Mobility: Supine to Sit Supine to sit: Mod assist, HOB elevated General bed mobility comments: pt up in recliner upon OT arrival    Transfers  Overall transfer level: Needs assistance Equipment used: 1 person hand held assist Transfers: Lateral/Scoot Transfers  Lateral/Scoot Transfers: Max assist General transfer comment: NT pt declined to attempt SPT due to L foot pain, will need lateral scoot max A per PT note    Ambulation / Gait / Stairs / Wheelchair Mobility       Posture / Balance Balance Overall balance assessment: Needs assistance Sitting balance-Leahy Scale: Fair Standing balance comment: NT    Special needs/care consideration BiPAP/CPAP: no CPM:  no Continuous Drip IV: magnesium sulfate IVPB  Dialysis: yes        Days: Tue, Thurs, Sat Life Vest: no Oxygen: no Special Bed: no, pt sleeps in recliner at home Trach Size: no Wound Vac (area): no      Location: no Skin:  Right LE surgical incision (AKA); wounds to plantar surface of L foot                             Bowel mgmt:06/09/18 Bladder mgmt: oliguria  Diabetic mgmt: yes     Previous Home Environment Living Arrangements: Spouse/significant other Available Help at Discharge: Family, Available 24 hours/day Type of Home: House Home Layout: Two level Alternate Level Stairs-Rails: Left Alternate Level Stairs-Number of Steps: 6 Home Access: Ramped entrance Bathroom Shower/Tub: Multimedia programmer: Standard Bathroom Accessibility: Yes Additional Comments: Per pt, husband in good physical condition  Discharge Living Setting Plans for Discharge Living Setting: Patient's home, Lives with (comment), House(lives with husband; neice michelle helps with ADLs) Type of Home at Discharge: House Discharge Home Layout: Two level, Able to live on main level with bedroom/bathroom(split level; bathrooms upstairs; uses BSC downstairs) Alternate Level Stairs-Rails: None(unknown -pt does not use) Alternate Level Stairs-Number of Steps: unknown-pt does not use Discharge Home Access: Ramped entrance Discharge Bathroom Shower/Tub: None(pt sponge bathes; no bathroom downstairs) Discharge Bathroom Toilet: Handicapped height(uses BSC next to recliner) Discharge Bathroom Accessibility: No(bathrooms only upstairs) Does the patient have any problems obtaining your medications?: No  Social/Family/Support Systems Patient Roles: Spouse Contact Information: husband is emergency contact  Anticipated Caregiver: husband: Izell Lyons [phone]; [phone]) Anticipated Caregiver's Contact Information: see above; niece Sharyn Lull also assists (but does work job from  [employer]) Ability/Limitations of Caregiver: Min/Mod A for transfers Caregiver Availability: 24/7 Discharge Plan Discussed with Primary Caregiver: Yes(with pt and husband) Is Caregiver In Agreement with Plan?: Yes Does Caregiver/Family have Issues with Lodging/Transportation while Pt is in Rehab?: No   Goals/Additional Needs Patient/Family Goal for Rehab: PT/OT: Min/Mod A from wheelchair level; SLP: NA Expected length of stay: 12-16 days Cultural Considerations: NA Dietary Needs: renal/carb modified diet with fluid restriction: 1259m fluid restriction; thin liquids Equipment Needs: TBD Special Service Needs: wound care for LLE Additional Information: family needs training on transfer technique Pt/Family Agrees to Admission and willing to participate: Yes(pt and husband) Program Orientation Provided & Reviewed with Pt/Caregiver Including Roles  & Responsibilities: Yes(pt and husband)  Barriers to Discharge: Hemodialysis, Lack of/limited family support, Wound Care(family will need trainng to reduce caregiver effort)   Decrease burden of Care through IP rehab admission: Other family would like education to improve transfer technique   Possible need for SNF placement upon discharge: Not anticipated; pt has 24/7 support at home with good prognosis to progress back to PLOF.    Patient Condition: This patient's condition remains as documented in the consult dated 06/09/18, in which the Rehabilitation Physician determined and documented that the patient's condition is appropriate for intensive rehabilitative care in an inpatient rehabilitation facility pending medical stability and tolerance. These areas have been addressed as pt's attending has provided approval for CIR and pt in agreement for intensive therapy with pt performing max scoot transfers with Max A and hopeful for ambulation with prosthetic in the future.  Will admit to inpatient rehab today.  Preadmission Screen Completed By:  KJhonnie Garner 06/10/2018 4:44 PM ______________________________________________________________________   Discussed status with Dr. KLetta Pateon 06/10/18 at 4:41PM  and received telephone approval for admission today.  Admission Coordinator:  KJhonnie Garner time 4:41 PM /Sudie Grumbling9/12/19.

## 2018-06-10 NOTE — Progress Notes (Signed)
Inpatient Rehabilitation-Admissions Coordinator    Met with patient at the bedside to discuss team's recommendation for inpatient rehabilitation. Shared booklets, expectations while in CIR, expected length of stay, and anticipated functional level at DC. Per pt request, AC spoke with pt's husband over the phone regarding the information above and wants her husband to help make decision. After lengthy discussion, the plan is to pursue CIR at this time.   AC has initiated Ship broker. Will follow along for activity tolerance with therapies and insurance decision.   Please call if questions.   [name], OTR/L  Rehab Admissions Coordinator  [phone] 06/10/2018 10:35 AM

## 2018-06-10 NOTE — Progress Notes (Addendum)
Initial Nutrition Assessment  DOCUMENTATION CODES:   Not applicable  INTERVENTION:    Nepro Shake po TID, each supplement provides 425 kcal and 19 grams protein  Prostat liquid protein po 30 ml BID, each supplement provides 100 kcal, 15 grams protein  NUTRITION DIAGNOSIS:   Increased nutrient needs related to chronic illness, post-op healing as evidenced by estimated needs  GOAL:   Patient will meet greater than or equal to 90% of their needs  MONITOR:   PO intake, Supplement acceptance, Labs, Skin, Weight trends, I & O's  REASON FOR ASSESSMENT:   Malnutrition Screening Tool  ASSESSMENT:   72 yo Female with PMH of ESRD on HD, CHF, COPD, type II DM; presented with the diagnosis of nonviable tissue; pt is s/p AKA 9/10.  RD spoke with patient at bedside. She reports her appetite is fair. Was eating fair PTA. Pt doesn't particularly like Nepro Shakes but will drink them.  She is unsure if she's lost weight or not. Does not know her EDW. Medications include colace, folvite and rena-vit daily. Labs reviewed. BUN 47 (H). Cr 4.38 (H).  NUTRITION - FOCUSED PHYSICAL EXAM:  Deferred at this time.  Diet Order:   Diet Order            Diet renal/carb modified with fluid restriction Diet-HS Snack? Nothing; Fluid restriction: 1200 mL Fluid; Room service appropriate? Yes; Fluid consistency: Thin  Diet effective now             EDUCATION NEEDS:   No education needs have been identified at this time  Skin:  Skin Assessment: Skin Integrity Issues: Skin Integrity Issues:: Incisions Incisions: R leg  Last BM:  9/11  Height:   Ht Readings from Last 1 Encounters:  06/04/18 5\' 4"  (1.626 m)   Weight:   Wt Readings from Last 1 Encounters:  06/10/18 65.4 kg   BMI:  Body mass index is 24.75 kg/m.  Estimated Nutritional Needs:   Kcal:  1700-1900  Protein:  80-95 gm  Fluid:  1000 ml + UOP  Arthur Holms, RD, LDN Pager #: [phone] After-Hours Pager #:  [phone]

## 2018-06-10 NOTE — Progress Notes (Signed)
Report received from [place], [place] in dialysis. Patient will go to Inpatient rehab from dialysis. Report given to Marjorie Smolder, RN in rehab.

## 2018-06-10 NOTE — Progress Notes (Signed)
PT Cancellation Note  Patient Details Name: [name] MRN: [id number] DOB: [birth year]   Cancelled Treatment:    Reason Eval/Treat Not Completed: Patient at procedure or test/unavailable (HD). Will follow-up for PT treatment as schedule permits.  Mabeline Caras, PT, DPT Acute Rehabilitation Services  Pager [phone] Office [place] 06/10/2018, 3:39 PM

## 2018-06-10 NOTE — Progress Notes (Addendum)
[facility] Progress Note   Subjective:  Having pain in her R stump. No CP/dyspnea. Scheduled for dialysis later today.  Objective Vitals:   06/09/18 1119 06/09/18 1943 06/10/18 0454 06/10/18 0819  BP:  (!) 160/58 (!) 154/67 (!) 130/48  Pulse: 70 75 90   Resp:  15 18 (!) 25  Temp:  97.7 F (36.5 C) 98.1 F (36.7 C) 98.6 F (37 C)  TempSrc:  Oral Oral Oral  SpO2:   96%   Weight:       Physical Exam General: Frail woman, NAD Heart: RRR; 2/6 SEM Lungs: CTAB Abdomen: soft, non-tender Extremities: R AKA stump is bandaged, L ankle bandaged Dialysis Access: TDC in R chest, maturing LUE AVF + bruit  Additional Objective Labs: Basic Metabolic Panel: Recent Labs  Lab 06/08/18 0752 06/09/18 0344 06/10/18 0409  NA 135 136 135  K 3.0* 3.9 3.7  CL 95* 98 100  CO2 21* 25 24  GLUCOSE 118* 191* 174*  BUN 100* 30* 47*  CREATININE 5.89* 3.00* 4.38*  CALCIUM 8.1* 7.7* 7.8*   Liver Function Tests: Recent Labs  Lab 06/08/18 0752  AST 18  ALT 13  ALKPHOS 83  BILITOT 0.4  PROT 6.6  ALBUMIN 1.8*   CBC: Recent Labs  Lab 06/08/18 0752 06/09/18 0344 06/10/18 0409  WBC 13.1* 12.0* 9.4  HGB 8.3* 7.7* 8.3*  HCT 29.0* 26.8* 29.4*  MCV 92.9 92.1 95.1  PLT 326 276 252   Recent Labs  Lab 06/09/18 0626 06/09/18 1156 06/09/18 1613 06/09/18 2100 06/10/18 0619  GLUCAP 167* 134* 231* 245* 168*   Medications: .  ceFAZolin (ANCEF) IV 1 g (06/09/18 1118)  . magnesium sulfate 1 - 4 g bolus IVPB     . sodium chloride   Intravenous Once  . allopurinol  100 mg Oral Q M,W,F  . apixaban  5 mg Oral BID  . atorvastatin  20 mg Oral QHS  . brimonidine  1 drop Left Eye BID   And  . timolol  1 drop Left Eye BID  . Chlorhexidine Gluconate Cloth  6 each Topical Q0600  . darbepoetin (ARANESP) injection - DIALYSIS  200 mcg Intravenous Q Thu-HD  . dicyclomine  10 mg Oral TID AC & HS  . docusate sodium  100 mg Oral Daily  . feeding supplement (NEPRO CARB STEADY)  237 mL  Oral TID WC  . feeding supplement (PRO-STAT SUGAR FREE 64)  30 mL Oral BID  . fentaNYL  50 mcg Transdermal Q72H  . folic acid  1 mg Oral Daily  . hydroxychloroquine  200 mg Oral Daily  . insulin aspart  0-15 Units Subcutaneous TID WC  . latanoprost  1 drop Both Eyes QHS  . metoprolol tartrate  50 mg Oral Once per day on Tue Thu Sat  . metoprolol tartrate  50 mg Oral 2 times per day on Sun Mon Wed Fri  . multivitamin  1 tablet Oral QHS  . pantoprazole  40 mg Oral Daily  . predniSONE  5 mg Oral Q breakfast    Dialysis Orders: TTS at [facility] 3:30hr, 400/800, EDW 69.5kg, 2K/2.5Ca, AVF, heparin 2000 bolus - Venofer 50mg  IV weekly - Mircera 266mcg IV q 2 weeks (last given 9/5 - Hgb 7.4 at that time)  Assessment/Plan: 1. S/p R AKA 9/10 for severe RLE wounds: Per [facility]. Looks like plan for CIR soon. Depression issues 2. ESRD:Continue TTS schedule, next 9/12. Tight heparin. 3. Hypertension/volume:BP improved, EDW will be lowered on d/c.  4. Anemia:Hgb trending up - 8.3 todayStart ESA. Transfuse prn. 5. Metabolic bone disease:Corr Ca ok, Phos pending. No VDRA. 6. Nutrition:Alb very low, continue Nepro and Pro-stat supps. 7. Type 2 DM: Per primary. 8. PVD, L foot wound: Per [facility].  Veneta Penton, PA-C 06/10/2018, 9:55 AM  [facility] Pager: [phone] I have seen and examined this patient and agree with the plan of care seen , eval examined, counseled .  Jeneen Rinks Khamani Daniely 06/10/2018, 12:58 PM

## 2018-06-10 NOTE — Progress Notes (Signed)
Orthopedic Tech Progress Note Patient Details:  [name] [birth year] [id number]  Patient ID: [name], female   DOB: [birth year], 72 y.o.   MRN: [id number]   [name] 06/10/2018, 8:44 AMCalled [employer] for right AKA stump sock.

## 2018-06-10 NOTE — Progress Notes (Signed)
Physical Medicine and Rehabilitation Consult   Reason for Consult: L-AKA Referring Physician: Dr. Oneida Alar     HPI: [name] is a 72 y.o. female with history of T2DM with nephropathy and retinopathy, COPD, DVT, A. fib/a flutter, bilateral lower extremity wounds with progression of RLE ulcers to calf and anterior leg, inability to walk and significant pain.  History taken from chart review, patient, and wife. She was willing to undergo surgical intervention and was admitted on 06/08/2018 for right AKA by Dr. Oneida Alar.  Postop therapy evaluations pending.  CIR recommended in anticipation of extensive rehab needs.    She has been wheelchair dependent for past few months but was able to perform transfers with assist. Sits in her recliner chair most of the day. Niece assist with ADLs.    Review of Systems  Constitutional: Negative for chills and fever.  HENT: Negative for hearing loss and tinnitus.   Eyes: Negative for blurred vision and double vision.  Respiratory: Negative for cough and shortness of breath.   Cardiovascular: Negative for chest pain and palpitations.  Gastrointestinal: Negative for abdominal pain, heartburn and nausea.  Genitourinary: Negative for dysuria.  Musculoskeletal: Positive for myalgias. Negative for back pain.  Skin: Negative for rash.  Neurological: Positive for weakness. Negative for dizziness and headaches.  Psychiatric/Behavioral: Positive for memory loss.  All other systems reviewed and are negative.       Past Medical History:  Diagnosis Date  . Anemia    . Arthritis    . Asthma    . Atrial fibrillation ([place])    . Atrial flutter [facility])      TEE/DCCV December 2013 - on Xarelto [facility])  . Bone spur of toe      Right 5th toe  . Chronic diastolic CHF (congestive heart failure) (HCC)      a. EF 50-55% by echo in 2015  . CKD (chronic kidney disease) stage 4, GFR 15-29 ml/min (HCC)      a. s/p fistula placement but not on HD. Followed by Nephrology.    . Colon polyposis    . COPD (chronic obstructive pulmonary disease) ([place])    . DVT of upper extremity (deep vein thrombosis) ([place])      Right basilic vein, June 5643  . Essential hypertension, benign    . Fractures    . Glaucoma    . SLE (systemic lupus erythematosus) ([place])    . Type 2 diabetes mellitus ([place])    . UTI (lower urinary tract infection)    . Wound of right leg 06/2017           Past Surgical History:  Procedure Laterality Date  . ABDOMINAL AORTOGRAM W/LOWER EXTREMITY N/A 12/18/2017    Procedure: ABDOMINAL AORTOGRAM W/LOWER EXTREMITY;  Surgeon: Elam Dutch, MD;  Location: [place] CV LAB;  Service: Cardiovascular;  Laterality: N/A;  bilateral  . ABDOMINAL HYSTERECTOMY   1983  . AMPUTATION Right 06/08/2018    Procedure: RIGHT ABOVE KNEE AMPUTATION;  Surgeon: Elam Dutch, MD;  Location: [place];  Service: Vascular;  Laterality: Right;  . ANKLE SURGERY   1993  . AV FISTULA PLACEMENT Left 03/27/2014    Procedure: ARTERIOVENOUS (AV) FISTULA CREATION;  Surgeon: Rosetta Posner, MD;  Location: [place];  Service: Vascular;  Laterality: Left;  . BACK SURGERY   1980  . BASCILIC VEIN TRANSPOSITION Left 01/13/2018    Procedure: LEFT BASILIC VEIN TRANSPOSITION SECOND STAGE;  Surgeon: Elam Dutch, MD;  Location: [place];  Service: Vascular;  Laterality: Left;  . CARDIOVASCULAR STRESS TEST   12/19/2009    no stress induced rhythm abnormalities, ekg negative for ischemia  . CARDIOVERSION   09/16/2012    Procedure: CARDIOVERSION;  Surgeon: Sanda Klein, MD;  [facility];  Service: Cardiovascular;  Laterality: N/A;  . CATARACT EXTRACTION      . COLONOSCOPY   09/20/2012    Dr. [name]: multiple tubular adenomas   . COLONOSCOPY   2005    Dr. [name]. Polyps, path unknown   . COLONOSCOPY N/A 07/24/2016    Procedure: COLONOSCOPY;  Surgeon: [name], MD;  [facility];  Service: Endoscopy;  Laterality: N/A;  9:00 am  . ESOPHAGOGASTRODUODENOSCOPY    09/20/2012    Dr. [name]: duodenal erosion and possible resolving ulcer at the angularis   . ESOPHAGOGASTRODUODENOSCOPY N/A 03/24/2018    Procedure: ESOPHAGOGASTRODUODENOSCOPY (EGD);  Surgeon: [name], MD;  [facility];  Service: Endoscopy;  Laterality: N/A;  12:45pm- moved up to arrive at 8:30am per Tretha Sciara  . EYE SURGERY      . IR FLUORO GUIDE CV LINE RIGHT   11/09/2017  . IR US GUIDE VASC ACCESS RIGHT   11/09/2017  . REVISON OF ARTERIOVENOUS FISTULA Left 03/15/2018    Procedure: REVISION OF Left arm BASILIC VEIN TRANSPOSITION;  Surgeon: Angelia Mould, MD;  Location: [place];  Service: Vascular;  Laterality: Left;  . TEE WITHOUT CARDIOVERSION   09/16/2012    Procedure: TRANSESOPHAGEAL ECHOCARDIOGRAM (TEE);  Surgeon: Sanda Klein, MD;  Location: [facility] ENDOSCOPY;  Service: Cardiovascular;  Laterality: N/A;  . TRANSESOPHAGEAL ECHOCARDIOGRAM WITH CARDIOVERSION   09/16/2012    EF 60-65%, moderate LVH, moderate regurg of the aortic valve, LA moderately dilated           Family History  Problem Relation Age of Onset  . Diabetes Brother    . Diabetes Father    . Hypertension Father    . Hypertension Sister    . Stroke Mother    . Diabetes Sister    . Hypertension Brother    . Colon cancer Neg Hx      Social History:   Married. Husband supportive and she reports that family has been assisting PTA. per reports that she quit smoking about 5 years ago. Her smoking use included cigarettes. She has a 30.00 pack-year smoking history. She has never used smokeless tobacco. She reports that she does not drink alcohol or use drugs.        Allergies  Allergen Reactions  . Ace Inhibitors Cough            Medications Prior to Admission  Medication Sig Dispense Refill  . acetaminophen (TYLENOL) 500 MG tablet Take 1,000 mg by mouth every 6 (six) hours as needed for moderate pain.       Marland Kitchen albuterol (PROVENTIL HFA;VENTOLIN HFA) 108 (90 BASE) MCG/ACT inhaler Inhale 2 puffs into  the lungs every 6 (six) hours as needed for shortness of breath.       . allopurinol (ZYLOPRIM) 100 MG tablet Take 100 mg by mouth every Monday, Wednesday, and Friday.       . Amino Acids-Protein Hydrolys (FEEDING SUPPLEMENT, PRO-STAT SUGAR FREE 64,) LIQD Take 30 mLs by mouth 2 (two) times daily. 900 mL 0  . apixaban (ELIQUIS) 5 MG TABS tablet Take 1 tablet (5 mg total) by mouth 2 (two) times daily. 60 tablet 6  . atorvastatin (LIPITOR) 20 MG tablet Take 20 mg by  mouth at bedtime.       . brimonidine-timolol (COMBIGAN) 0.2-0.5 % ophthalmic solution Place 1 drop into the left eye 2 (two) times daily.       Marland Kitchen dicyclomine (BENTYL) 10 MG capsule Take 10 mg by mouth 4 (four) times daily -  before meals and at bedtime.      . fentaNYL (DURAGESIC - DOSED MCG/HR) 50 MCG/HR Place 50 mcg onto the skin every 3 (three) days.       . folic acid (FOLVITE) 1 MG tablet Take 1 mg by mouth daily.      . folic acid-vitamin b complex-vitamin c-selenium-zinc (DIALYVITE) 3 MG TABS tablet Take 1 tablet by mouth daily.      . hydroxychloroquine (PLAQUENIL) 200 MG tablet Take 200 mg by mouth daily.       . Insulin Detemir (LEVEMIR FLEXTOUCH) 100 UNIT/ML Pen Inject 10 Units into the skin every morning.      . insulin lispro (HUMALOG KWIKPEN) 100 UNIT/ML KiwkPen Inject 5 Units into the skin 3 (three) times daily with meals as needed (*Only take if blood sugar levels are over 100).       . metoprolol tartrate (LOPRESSOR) 50 MG tablet Take 50 mg by mouth See admin instructions. Take 50mg  twice a day, except on Tuesday, Thursday, Saturday only take once daily.      Marland Kitchen oxyCODONE-acetaminophen (PERCOCET/ROXICET) 5-325 MG tablet Take 1 tablet by mouth every 6 (six) hours as needed for severe pain. 20 tablet 0  . pantoprazole (PROTONIX) 40 MG tablet Take 1 tablet (40 mg total) by mouth daily. 30 tablet 11  . predniSONE (DELTASONE) 5 MG tablet Take 5 mg by mouth daily with breakfast.      . travoprost, benzalkonium, (TRAVATAN) 0.004 %  ophthalmic solution Place 1 drop into both eyes at bedtime.       Marland Kitchen albuterol (ACCUNEB) 0.63 MG/3ML nebulizer solution Take 1 ampule by nebulization every 6 (six) hours as needed for wheezing or shortness of breath.           Home:    Functional History: Functional Status:  Mobility:   ADL:   Cognition: Cognition Orientation Level: Oriented X4   Blood pressure (!) 151/50, pulse 70, temperature 97.9 F (36.6 C), temperature source Oral, resp. rate (!) 22, weight 63.7 kg, SpO2 100 %. Physical Exam  Nursing note and vitals reviewed. Constitutional: She appears well-developed and well-nourished.  HENT:  Head: Normocephalic and atraumatic.  Eyes: Right eye exhibits no discharge. Left eye exhibits no discharge.  EOM not intact  Neck: Neck supple.  Cardiovascular: Normal rate, regular rhythm and normal heart sounds.  Respiratory: Effort normal and breath sounds normal.  GI: Soft. Bowel sounds are normal.  Musculoskeletal:  R-AKA with compressive dressing.  Left foot transmet site with coban dressing.   Neurological: She is alert.  Mild dysarthria.  Tendency to lean to the right.  Mild disorientation noted--she thought that she was [facility] but able to correct with questioning cues.  Able to follow simple motor commands.  Motor: B/l UE: 4+/5 proximal to distal RLE: HF 4/5 (pain inhibition) LLE: HF, KE 4/5, ADF limited due to pain  Skin:  See above  Psychiatric: She has a normal mood and affect. Her behavior is normal.      Lab Results Last 24 Hours       Results for orders placed or performed during the hospital encounter of 06/08/18 (from the past 24 hour(s))  Glucose, capillary  Status: Abnormal    Collection Time: 06/08/18 11:17 AM  Result Value Ref Range    Glucose-Capillary 124 (H) 70 - 99 mg/dL    Comment 1 Notify RN    Glucose, capillary     Status: Abnormal    Collection Time: 06/08/18  4:14 PM  Result Value Ref Range    Glucose-Capillary 104 (H) 70 - 99 mg/dL   Basic metabolic panel     Status: Abnormal    Collection Time: 06/09/18  3:44 AM  Result Value Ref Range    Sodium 136 135 - 145 mmol/L    Potassium 3.9 3.5 - 5.1 mmol/L    Chloride 98 98 - 111 mmol/L    CO2 25 22 - 32 mmol/L    Glucose, Bld 191 (H) 70 - 99 mg/dL    BUN 30 (H) 8 - 23 mg/dL    Creatinine, Ser 3.00 (H) 0.44 - 1.00 mg/dL    Calcium 7.7 (L) 8.9 - 10.3 mg/dL    GFR calc non Af Amer 15 (L) >60 mL/min    GFR calc Af Amer 17 (L) >60 mL/min    Anion gap 13 5 - 15  CBC     Status: Abnormal    Collection Time: 06/09/18  3:44 AM  Result Value Ref Range    WBC 12.0 (H) 4.0 - 10.5 K/uL    RBC 2.91 (L) 3.87 - 5.11 MIL/uL    Hemoglobin 7.7 (L) 12.0 - 15.0 g/dL    HCT 26.8 (L) 36.0 - 46.0 %    MCV 92.1 78.0 - 100.0 fL    MCH 26.5 26.0 - 34.0 pg    MCHC 28.7 (L) 30.0 - 36.0 g/dL    RDW 18.1 (H) 11.5 - 15.5 %    Platelets 276 150 - 400 K/uL  Hemoglobin A1c     Status: None    Collection Time: 06/09/18  3:44 AM  Result Value Ref Range    Hgb A1c MFr Bld 5.4 4.8 - 5.6 %    Mean Plasma Glucose 108.28 mg/dL  Glucose, capillary     Status: Abnormal    Collection Time: 06/09/18  6:26 AM  Result Value Ref Range    Glucose-Capillary 167 (H) 70 - 99 mg/dL      Imaging Results (Last 48 hours)  No results found.     Assessment/Plan: Diagnosis: Right AKA Labs independently reviewed.  Records reviewed and summated above. Clean amputation daily with soap and water Monitor incision site for signs of infection or impending skin breakdown. Staples to remain in place for 3-4 weeks Stump shrinker, for edema control  Scar mobilization massaging to prevent soft tissue adherence Stump protector during therapies Prevent flexion contractures by implementing the following:              Encourage prone lying for 20-30 mins per day BID to avoid hip flexion       Contractures if medically appropriate;             Avoid prolonged sitting Post surgical pain control with oral  medication Phantom limb pain control with physical modalities including desensitization techniques (gentle self massage to the residual stump,hot packs if sensation intact, [place]) and mirror therapy, TENS. If ineffective, consider pharmacological treatment for neuropathic pain (e.g gabapentin, pregabalin, amytriptalyine, duloxetine).  Avoid injury to contralateral side   1. Does the need for close, 24 hr/day medical supervision in concert with the patient's rehab needs make it unreasonable for this patient to be  served in a less intensive setting? Yes  2. Co-Morbidities requiring supervision/potential complications: T2 DM with nephropathy and retinopathy (Monitor in accordance with exercise and adjust meds as necessary), COPD (monitor RR and O2 sats with increased exertion), DVT, A. fib/a flutter (cont meds, monitor HR with increased mobility), bilateral lower extremity wounds, post-op pain (Biofeedback training with therapies to help reduce reliance on opiate pain medications, particularly IV morphine, monitor pain control during therapies, and sedation at rest and titrate to maximum efficacy to ensure participation and gains in therapies), leukocytosis (cont to monitor for signs and symptoms of infection, further workup if indicated) 3. tachypnea (monitor RR and O2 Sats with increased physical exertion) 4. Due to bladder management, safety, skin/wound care, disease management, medication administration, pain management and patient education, does the patient require 24 hr/day rehab nursing? Yes 5. Does the patient require coordinated care of a physician, rehab nurse, PT (1-2 hrs/day, 5 days/week) and OT (1-2 hrs/day, 5 days/week) to address physical and functional deficits in the context of the above medical diagnosis(es)? Yes Addressing deficits in the following areas: balance, endurance, locomotion, strength, transferring, bowel/bladder control, bathing, dressing, feeding, grooming, toileting and  psychosocial support 6. Can the patient actively participate in an intensive therapy program of at least 3 hrs of therapy per day at least 5 days per week? Potentially 7. The potential for patient to make measurable gains while on inpatient rehab is excellent 8. Anticipated functional outcomes upon discharge from inpatient rehab are Min/Mod A wheelchair level  with PT, Min/Mod A at wheelchair level with OT, n/a with SLP. 9. Estimated rehab length of stay to reach the above functional goals is: 12-16 days. 10. Anticipated D/C setting: Home 11. Anticipated post D/C treatments: HH therapy and Home excercise program 12. Overall Rehab/Functional Prognosis: good and fair   RECOMMENDATIONS: This patient's condition is appropriate for continued rehabilitative care in the following setting: CIR when medically stable and able to tolerate 3 hours of therapy/day. Patient has agreed to participate in recommended program. Potentially Note that insurance prior authorization may be required for reimbursement for recommended care.   Comment: Rehab Admissions Coordinator to follow up.     I have personally performed a face to face diagnostic evaluation, including, but not limited to relevant history and physical exam findings, of this patient and developed relevant assessment and plan.  Additionally, I have reviewed and concur with the physician assistant's documentation above.    Delice Lesch, MD, ABPMR Bary Leriche, PA-C 06/09/2018        Revision History        Routing History

## 2018-06-10 NOTE — IPOC Note (Signed)
Overall Plan of Care [facility]) Patient Details Name: [name] MRN: [id number] DOB: 1946/05/29  Admitting Diagnosis: Right AKA  Hospital Problems: Active Problems:   Unilateral AKA, right (HCC)   Type II diabetes mellitus with peripheral autonomic neuropathy (HCC)   Anemia of chronic disease   Prediabetes   Postoperative pain   Ischemic leg pain     Functional Problem List: Nursing Medication Management, Safety, Nutrition, Skin Integrity, Pain, Endurance  PT Balance, Endurance, Motor, Pain, Sensory  OT Balance, Behavior, Nutrition, Pain, Edema, Endurance, Motor, Safety, Sensory  SLP    TR         Basic ADL's: OT Grooming, Bathing, Dressing, Toileting     Advanced  ADL's: OT       Transfers: PT Bed Mobility, Bed to Chair, Car, [employer], Floor  OT Toilet     Locomotion: PT Ambulation, Emergency planning/management officer, Stairs     Additional Impairments: OT None  SLP        TR      Anticipated Outcomes Item Anticipated Outcome  Self Feeding n/a  Swallowing      Basic self-care  supervision  Toileting  supervision   Bathroom Transfers supervision  Bowel/Bladder  Dealer  Supervision at w/c level  Communication     Cognition     Pain  <4 on a 1-10 pain scale  Safety/Judgment  Moderate assist and call for assistance before getting out of bed   Therapy Plan: PT Intensity: Minimum of 1-2 x/day ,45 to 90 minutes PT Frequency: 5 out of 7 days PT Duration Estimated Length of Stay: 14-17 days OT Intensity: Minimum of 1-2 x/day, 45 to 90 minutes OT Frequency: 5 out of 7 days OT Duration/Estimated Length of Stay: ~12-14 days      Team Interventions: Nursing Interventions Patient/Family Education, Pain Management, Skin Care/Wound Management, Psychosocial Support, Discharge Planning, Medication Management, Disease Management/Prevention  PT interventions Ambulation/gait training, Training and development officer, Community  reintegration, Discharge planning, Disease management/prevention, DME/adaptive equipment instruction, Functional mobility training, Pain management, Patient/family education, Psychosocial support, Therapeutic Exercise, Therapeutic Activities, UE/LE Strength taining/ROM, UE/LE Coordination activities, Wheelchair propulsion/positioning  OT Interventions Training and development officer, Community reintegration, Disease mangement/prevention, Barrister's clerk education, Self Care/advanced ADL retraining, Splinting/orthotics, Therapeutic Exercise, UE/LE Coordination activities, Wheelchair propulsion/positioning, UE/LE Strength taining/ROM, Therapeutic Activities, Skin care/wound managment, Psychosocial support, Pain management, Functional mobility training, DME/adaptive equipment instruction, Discharge planning  SLP Interventions    TR Interventions    SW/CM Interventions Discharge Planning, Psychosocial Support, Patient/Family Education   Barriers to Discharge MD  Medical stability, Wound care and Left foot dry gangrene  Nursing Inaccessible home environment, Decreased caregiver support, Wound Care, Weight bearing restrictions    PT Inaccessible home environment, Medical stability, Home environment access/layout, Hemodialysis    OT      SLP      SW       Team Discharge Planning: Destination: PT-Home ,OT- Home , SLP-  Projected Follow-up: PT-Home health PT, OT-  Home health OT, SLP-  Projected Equipment Needs: PT-Wheelchair (measurements), Wheelchair cushion (measurements), OT- 3 in 1 bedside comode, SLP-  Equipment Details: PT-pt owns RW and manual w/c, OT-wide drop arm BSC  Patient/family involved in discharge planning: PT- Patient, Family member/caregiver,  OT-Patient, SLP-   MD ELOS: 14-17 days. Medical Rehab Prognosis:  Fair Assessment: 72 year old female with history of T2DM with neuropathy nephropathy and retinopathy, COPD, DVT, A. fib/a flutter, bilauteral lower extremity wounds with  progression of right lower extremity except ulcers to calf and  anterior leg,inability to walk and significant pain.Patient was finally agreeable to undergo surgical intervention and was admitted on 06/08/2018 for right AKA by Dr. Oneida Alar. Patient with resulting functional deficits with mobility, transfers, self-care.  Will set goals for Supervision at wheelchair level with PT/OT.   See Team Conference Notes for weekly updates to the plan of care

## 2018-06-11 ENCOUNTER — Inpatient Hospital Stay (HOSPITAL_COMMUNITY): Payer: Medicare Other | Admitting: Physical Therapy

## 2018-06-11 ENCOUNTER — Inpatient Hospital Stay (HOSPITAL_COMMUNITY): Payer: Medicare Other | Admitting: Occupational Therapy

## 2018-06-11 DIAGNOSIS — E1143 Type 2 diabetes mellitus with diabetic autonomic (poly)neuropathy: Secondary | ICD-10-CM

## 2018-06-11 DIAGNOSIS — I998 Other disorder of circulatory system: Secondary | ICD-10-CM

## 2018-06-11 DIAGNOSIS — D638 Anemia in other chronic diseases classified elsewhere: Secondary | ICD-10-CM

## 2018-06-11 DIAGNOSIS — G8918 Other acute postprocedural pain: Secondary | ICD-10-CM

## 2018-06-11 DIAGNOSIS — R7303 Prediabetes: Secondary | ICD-10-CM | POA: Insufficient documentation

## 2018-06-11 LAB — TYPE AND SCREEN
ABO/RH(D): A POS
ANTIBODY SCREEN: NEGATIVE
UNIT DIVISION: 0
Unit division: 0

## 2018-06-11 LAB — BPAM RBC
Blood Product Expiration Date: 201909172359
Blood Product Expiration Date: 201909172359
Unit Type and Rh: 600
Unit Type and Rh: 600

## 2018-06-11 LAB — GLUCOSE, CAPILLARY
GLUCOSE-CAPILLARY: 122 mg/dL — AB (ref 70–99)
GLUCOSE-CAPILLARY: 140 mg/dL — AB (ref 70–99)
Glucose-Capillary: 142 mg/dL — ABNORMAL HIGH (ref 70–99)
Glucose-Capillary: 163 mg/dL — ABNORMAL HIGH (ref 70–99)

## 2018-06-11 MED ORDER — CHLORHEXIDINE GLUCONATE CLOTH 2 % EX PADS: 6.0000 | MEDICATED_PAD | Freq: Every day | CUTANEOUS | Status: DC

## 2018-06-11 MED ORDER — METOPROLOL TARTRATE 12.5 MG HALF TABLET
12.5000 mg | ORAL_TABLET | ORAL | Status: DC
  Administered 2018-06-11 – 2018-06-23 (×16): 12.5 mg via ORAL
  Filled 2018-06-11 (×17): qty 1

## 2018-06-11 NOTE — Progress Notes (Signed)
Subjective: Interval History: has no complaint,ready to work.  Objective: Vital signs in last 24 hours: Temp:  [97.7 F (36.5 C)-98.4 F (36.9 C)] 98.3 F (36.8 C) (09/13 0444) Pulse Rate:  [73-83] 73 (09/13 0444) Resp:  [17-26] 18 (09/13 0444) BP: (119-160)/(43-92) 119/92 (09/13 0444) SpO2:  [96 %-100 %] 100 % (09/13 0444) Weight:  [63.3 kg-65.4 kg] 63.3 kg (09/12 1850) Weight change:   Intake/Output from previous day: No intake/output data recorded. Intake/Output this shift: No intake/output data recorded.  General appearance: alert, cooperative, no distress and mildly obese Resp: clear to auscultation bilaterally Chest wall: RIJ PC Cardio: S1, S2 normal and systolic murmur: systolic ejection 2/6, crescendo and decrescendo at 2nd left intercostal space GI: soft, non-tender; bowel sounds normal; no masses,  no organomegaly Extremities: R BKA, L foot dressing  Lab Results: Recent Labs    06/09/18 0344 06/10/18 0409  WBC 12.0* 9.4  HGB 7.7* 8.3*  HCT 26.8* 29.4*  PLT 276 252   BMET:  Recent Labs    06/09/18 0344 06/10/18 0409  NA 136 135  K 3.9 3.7  CL 98 100  CO2 25 24  GLUCOSE 191* 174*  BUN 30* 47*  CREATININE 3.00* 4.38*  CALCIUM 7.7* 7.8*   No results for input(s): PTH in the last 72 hours. Iron Studies: No results for input(s): IRON, TIBC, TRANSFERRIN, FERRITIN in the last 72 hours.  Studies/Results: No results found.  I have reviewed the patient's current medications.  Assessment/Plan: 1 ESRD HD tomorrow. 2 Anemia esa, give Fe 3 DM controlled 4 PVD post BKA per rehab 5 HPTH vit D 6 [place] 7 COPD P HD, esa, , Rehab, control glu    LOS: 1 day   Kerstyn Coryell 06/11/2018,9:57 AM

## 2018-06-11 NOTE — Progress Notes (Signed)
Physical Therapy Assessment and Plan  Patient Details  Name: [name] MRN: [id number] Date of Birth: 1945-10-03  PT Diagnosis: Difficulty walking, Impaired sensation and Muscle weakness Rehab Potential: Good ELOS: 14-17 days   Today's Date: 06/11/2018 PT Individual Time: 1100-1155 PT Individual Time Calculation (min): 55 min    Problem List:  Patient Active Problem List   Diagnosis Date Noted  . Unilateral AKA, right ([place])   . Post-operative pain   . Diabetic retinopathy associated with type 2 diabetes mellitus ([place])   . Diabetic nephropathy associated with type 2 diabetes mellitus ([place])   . Leukocytosis   . Non-healing wound of lower extremity 06/08/2018  . Malnutrition of moderate degree 03/03/2018  . Leg wound, right 03/01/2018  . Ulcer of right calf ([place]) 02/10/2018  . Chronic RLQ pain 01/11/2018  . Ischemic ulcer of toes on both feet ([place])   . Cellulitis in diabetic foot ([place]) 12/14/2017  . Atrial fibrillation ([place]) 12/14/2017  . Glaucoma 12/14/2017  . ESRD (end stage renal disease) on dialysis ([place]) 11/16/2017  . COPD with acute exacerbation ([place]) 11/03/2017  . Community acquired pneumonia 11/02/2017  . Nausea and vomiting 11/02/2017  . Iron deficiency anemia 08/07/2017  . Heme positive stool 10/04/2015  . Mixed hyperlipidemia 06/29/2015  . Type 2 diabetes mellitus with stage 4 chronic kidney disease, with long-term current use of insulin ([place]) 06/29/2015  . Chronic kidney disease, stage V ([place]) 04/12/2014  . 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 Acute respiratory failure with hypoxia ([place]) 03/15/2014  . Chronic diastolic heart failure ([place]) 03/15/2014  . Anemia of chronic renal failure, stage 4 (severe) ([place]) 02/23/2014  . Folate deficiency 02/17/2014  . Aortic regurgitation 09/19/2012  . Class 2 severe obesity due to excess calories with serious comorbidity and body mass index (BMI) of 35.0 to  35.9 in adult ([place]) 09/14/2012  . Type 2 diabetes mellitus with diabetic neuropathy ([place]) 09/14/2012  . SLE (systemic lupus erythematosus) ([place]) 09/14/2012  . Essential hypertension, benign 09/14/2012  . COPD (chronic obstructive pulmonary disease) ([place]) 09/14/2012    Past Medical History:  Past Medical History:  Diagnosis Date  . Anemia   . Arthritis   . Asthma   . Atrial fibrillation ([place])   . Atrial flutter [facility])    TEE/DCCV December 2013 - on Xarelto [facility])  . Bone spur of toe    Right 5th toe  . Chronic diastolic CHF (congestive heart failure) (HCC)    a. EF 50-55% by echo in 2015  . CKD (chronic kidney disease) stage 4, GFR 15-29 ml/min (HCC)    a. s/p fistula placement but not on HD. Followed by Nephrology.   . Colon polyposis   . COPD (chronic obstructive pulmonary disease) ([place])   . DVT of upper extremity (deep vein thrombosis) ([place])    Right basilic vein, June 5974  . Essential hypertension, benign   . Fractures   . Glaucoma   . SLE (systemic lupus erythematosus) ([place])   . Type 2 diabetes mellitus ([place])   . UTI (lower urinary tract infection)   . Wound of right leg 06/2017   Past Surgical History:  Past Surgical History:  Procedure Laterality Date  . ABDOMINAL AORTOGRAM W/LOWER EXTREMITY N/A 12/18/2017   Procedure: ABDOMINAL AORTOGRAM W/LOWER EXTREMITY;  Surgeon: Elam Dutch, MD;  Location: [place] [facility];  Service: Cardiovascular;  Laterality: N/A;  bilateral  . ABDOMINAL HYSTERECTOMY  1983  . AMPUTATION Right 06/08/2018  Procedure: RIGHT ABOVE KNEE AMPUTATION;  Surgeon: Elam Dutch, MD;  Location: [place];  Service: Vascular;  Laterality: Right;  . ANKLE SURGERY  1993  . AV FISTULA PLACEMENT Left 03/27/2014   Procedure: ARTERIOVENOUS (AV) FISTULA CREATION;  Surgeon: Rosetta Posner, MD;  [facility];  Service: Vascular;  Laterality: Left;  . BACK SURGERY  1980  . BASCILIC VEIN TRANSPOSITION Left 01/13/2018   Procedure: LEFT BASILIC VEIN  TRANSPOSITION SECOND STAGE;  Surgeon: Elam Dutch, MD;  Location: [place];  Service: Vascular;  Laterality: Left;  . CARDIOVASCULAR STRESS TEST  12/19/2009   no stress induced rhythm abnormalities, ekg negative for ischemia  . CARDIOVERSION  09/16/2012   Procedure: CARDIOVERSION;  Surgeon: Sanda Klein, MD;  [facility];  Service: Cardiovascular;  Laterality: N/A;  . CATARACT EXTRACTION    . COLONOSCOPY  09/20/2012   Dr. [name]: multiple tubular adenomas   . COLONOSCOPY  2005   Dr. [name]. Polyps, path unknown   . COLONOSCOPY N/A 07/24/2016   Procedure: COLONOSCOPY;  Surgeon: [name], MD;  [facility];  Service: Endoscopy;  Laterality: N/A;  9:00 am  . ESOPHAGOGASTRODUODENOSCOPY  09/20/2012   Dr. [name]: duodenal erosion and possible resolving ulcer at the angularis   . ESOPHAGOGASTRODUODENOSCOPY N/A 03/24/2018   Procedure: ESOPHAGOGASTRODUODENOSCOPY (EGD);  Surgeon: [name], MD;  [facility];  Service: Endoscopy;  Laterality: N/A;  12:45pm- moved up to arrive at 8:30am per Tretha Sciara  . EYE SURGERY    . IR FLUORO GUIDE CV LINE RIGHT  11/09/2017  . IR US GUIDE VASC ACCESS RIGHT  11/09/2017  . REVISON OF ARTERIOVENOUS FISTULA Left 03/15/2018   Procedure: REVISION OF Left arm BASILIC VEIN TRANSPOSITION;  Surgeon: Angelia Mould, MD;  Location: [place];  Service: Vascular;  Laterality: Left;  . TEE WITHOUT CARDIOVERSION  09/16/2012   Procedure: TRANSESOPHAGEAL ECHOCARDIOGRAM (TEE);  Surgeon: Sanda Klein, MD;  Location[facility] ENDOSCOPY;  Service: Cardiovascular;  Laterality: N/A;  . TRANSESOPHAGEAL ECHOCARDIOGRAM WITH CARDIOVERSION  09/16/2012   EF 60-65%, moderate LVH, moderate regurg of the aortic valve, LA moderately dilated    Assessment & Plan Clinical Impression:  Bretta Bang. Bohnsack is a 72 year old female with history of T2DM with neuropathy nephropathy and retinopathy, COPD, DVT, A. fib/a flutter, bilateral lower extremity  wounds with progression of right lower extremity except ulcers to calf and anterior leg,inability to walk and significant pain.Patient was finally agreeable to undergo surgical intervention and was admitted on 06/08/2018 for right AKA by Dr. Oneida Alar. Therapy evaluations done yesterday and CIR recommended due to functional deficits. Patient transferred to CIR on 06/10/2018 .   Patient currently requires mod with mobility secondary to muscle weakness and decreased sitting balance.  Prior to hospitalization, patient was min with mobility and lived with Spouse in a House home.  Home access is  Ramped entrance.  Patient will benefit from skilled PT intervention to maximize safe functional mobility, minimize fall risk and decrease caregiver burden for planned discharge home with 24 hour supervision.  Anticipate patient will benefit from follow up [place] at discharge.  PT - End of Session Activity Tolerance: Tolerates 10 - 20 min activity with multiple rests Endurance Deficit: Yes Endurance Deficit Description: due to pain and generalized weakness PT Assessment Rehab Potential (ACUTE/IP ONLY): Good PT Barriers to Discharge: [place] home environment;Medical stability;Home environment access/layout;Hemodialysis PT Patient demonstrates impairments in the following area(s): Balance;Endurance;Motor;Pain;Sensory PT Transfers Functional Problem(s): Bed Mobility;Bed to Chair;Car;Furniture;Floor PT Locomotion Functional  Problem(s): Ambulation;Wheelchair Mobility;Stairs PT Plan PT Intensity: Minimum of 1-2 x/day ,45 to 90 minutes PT Frequency: 5 out of 7 days PT Duration Estimated Length of Stay: 14-17 days PT Treatment/Interventions: Ambulation/gait training;Balance/vestibular training;Community reintegration;Discharge planning;Disease management/prevention;DME/adaptive equipment instruction;Functional mobility training;Pain management;Patient/family education;Psychosocial support;Therapeutic  Exercise;Therapeutic Activities;UE/LE Strength taining/ROM;UE/LE Coordination activities;Wheelchair propulsion/positioning PT Transfers Anticipated Outcome(s): Supervision PT Locomotion Anticipated Outcome(s): Supervision at w/c level PT Recommendation Recommendations for Other Services: Neuropsych consult;Therapeutic Recreation consult Therapeutic Recreation Interventions: Stress management Follow Up Recommendations: Home health PT Patient destination: Home Equipment Recommended: Wheelchair (measurements);Wheelchair cushion (measurements) Equipment Details: pt owns RW and manual w/c  Skilled Therapeutic Intervention Evaluation completed (see details above and below) with education on PT POC and goals and individual treatment initiated with focus on functional transfers, w/c mobility, and education with patient and her spouse about realistic discharge goals. Pt received seated in w/c in room, reports 8/10 pain in R hip. RN aware and can provide pain medication to patient at end of therapy session, pt agreeable. Manual w/c propulsion x 100 ft with BUE and min A for steering as pt tends to deviate to the L. Sliding board transfer w/c to/from mat table with mod A, v/c for head/hips relationship and weight shift during transfer. Pt requests to use the bathroom at end of therapy session. Set pt up with drop arm platform commode in her bathroom. Family present in room at end of session and pt declines to use the bathroom. Pt left seated in w/c in room with needs in reach and family present.  PT Evaluation Precautions/Restrictions Precautions Precautions: Fall;Other (comment) Precaution Comments: R AKA, L foot wounds Restrictions Weight Bearing Restrictions: Yes RLE Weight Bearing: Non weight bearing Other Position/Activity Restrictions: caution about trying to put weight through left LE due to wounds on forefoot Pain Pain Assessment Pain Scale: 0-10 Pain Score: 9  Pain Type: Acute pain;Surgical  pain Pain Location: Leg Pain Orientation: Right Pain Descriptors / Indicators: Aching Pain Frequency: Constant Pain Onset: On-going Pain Intervention(s): Medication (See eMAR) Home Living/Prior Functioning Home Living Living Arrangements: Spouse/significant other Available Help at Discharge: Family;Available 24 hours/day Type of Home: House Home Access: Ramped entrance Home Layout: Two level Alternate Level Stairs-Number of Steps: 6- stair lift Alternate Level Stairs-Rails: Left Bathroom Shower/Tub: Multimedia programmer: Standard Bathroom Accessibility: Yes Additional Comments: Per pt, husband in good physical condition  Lives With: Spouse Prior Function Level of Independence: Needs assistance with gait;Needs assistance with tranfers  Able to Take Stairs?: No(ramped entry, stair lift in home) Driving: No Vocation: Retired Radiographer, therapeutic - History Baseline Vision: Wears glasses all the time(not present during eval) Vision - Assessment Additional Comments: L eye deviation Perception Perception: Within Functional Limits Praxis Praxis: Intact  Cognition Overall Cognitive Status: Impaired/Different from baseline Arousal/Alertness: Awake/alert Orientation Level: Oriented to person;Oriented to place;Oriented to situation Attention: Selective Selective Attention: Appears intact Memory: Appears intact Awareness: Appears intact Problem Solving: Appears intact Safety/Judgment: Appears intact Sensation Sensation Light Touch: Impaired Detail Peripheral sensation comments: decreased/absent in distal LLE Light Touch Impaired Details: Impaired LLE Proprioception: Impaired by gross assessment(decreased in LLE) Coordination Gross Motor Movements are Fluid and Coordinated: Yes Fine Motor Movements are Fluid and Coordinated: Yes Motor  Motor Motor - Skilled Clinical Observations: acute pain and generalized weakness  Mobility Bed Mobility Bed Mobility:  Rolling Right;Rolling Left;Right Sidelying to Sit Rolling Right: Minimal Assistance - Patient > 75% Rolling Left: Minimal Assistance - Patient > 75% Right Sidelying to Sit: Minimal Assistance - Patient > 75% Transfers  Transfers: Lateral/Scoot Transfers Lateral/Scoot Transfers: Moderate Assistance - Patient 50-74% Transfer (Assistive device): (sliding board) Artist / Additional Locomotion Stairs: No Architect: Yes Wheelchair Assistance: Minimal assistance - Patient >75% Wheelchair Propulsion: Both upper extremities Wheelchair Parts Management: Needs assistance Distance: 100 ft  Trunk/Postural Assessment  Cervical Assessment Cervical Assessment: Within Functional Limits Thoracic Assessment Thoracic Assessment: Within Functional Limits(flexed forward) Lumbar Assessment Lumbar Assessment: Exceptions to WFL(posterior pelvic tilt) Postural Control Postural Control: Within Functional Limits  Balance Balance Balance Assessed: Yes Static Sitting Balance Static Sitting - Balance Support: No upper extremity supported Static Sitting - Level of Assistance: 5: Stand by assistance Dynamic Sitting Balance Dynamic Sitting - Balance Support: Feet unsupported;No upper extremity supported Dynamic Sitting - Level of Assistance: 4: Min assist Extremity Assessment  RUE Assessment RUE Assessment: Within Functional Limits LUE Assessment LUE Assessment: Within Functional Limits RLE Assessment RLE Assessment: Exceptions to WFL(R AKA) General Strength Comments: 3/5 hip flex LLE Assessment LLE Assessment: Exceptions to [facility] General Strength Comments: 4/5 grossly  See Function Navigator for Current Functional Status.   Refer to Care Plan for Long Term Goals  Recommendations for other services: Neuropsych and Therapeutic Recreation  Stress management  Discharge Criteria: Patient will be discharged from PT if patient refuses treatment 3 consecutive times  without medical reason, if treatment goals not met, if there is a change in medical status, if patient makes no progress towards goals or if patient is discharged from hospital.  The above assessment, treatment plan, treatment alternatives and goals were discussed and mutually agreed upon: by patient and by family  Excell Seltzer, PT, DPT  06/11/2018, 12:23 PM

## 2018-06-11 NOTE — Progress Notes (Deleted)
Dry dressing applied to left foot wound. Toes with dry gangrene and patient reports ongoing pain as well as questions  "treating the foot" as she does not want to lose that leg. . Discussed lack of circulation leading to progressive decline, inability to reverse gangrenous changes and that we will continue to monitor for now.

## 2018-06-11 NOTE — Evaluation (Signed)
Occupational Therapy Assessment and Plan  Patient Details  Name: [name] MRN: [id number] Date of Birth: Dec 03, 1945  OT Diagnosis: acute pain and muscle weakness (generalized) Rehab Potential: Rehab Potential (ACUTE ONLY): Good ELOS: ~12-14 days   Today's Date: 06/11/2018 OT Individual Time: 2878-6767 OT Individual Time Calculation (min): 75 min     Problem List:  Patient Active Problem List   Diagnosis Date Noted  . Unilateral AKA, right ([place])   . Post-operative pain   . Diabetic retinopathy associated with type 2 diabetes mellitus ([place])   . Diabetic nephropathy associated with type 2 diabetes mellitus ([place])   . Leukocytosis   . Non-healing wound of lower extremity 06/08/2018  . Malnutrition of moderate degree 03/03/2018  . Leg wound, right 03/01/2018  . Ulcer of right calf ([place]) 02/10/2018  . Chronic RLQ pain 01/11/2018  . Ischemic ulcer of toes on both feet ([place])   . Cellulitis in diabetic foot ([place]) 12/14/2017  . Atrial fibrillation ([place]) 12/14/2017  . Glaucoma 12/14/2017  . ESRD (end stage renal disease) on dialysis ([place]) 11/16/2017  . COPD with acute exacerbation ([place]) 11/03/2017  . Community acquired pneumonia 11/02/2017  . Nausea and vomiting 11/02/2017  . Iron deficiency anemia 08/07/2017  . Heme positive stool 10/04/2015  . Mixed hyperlipidemia 06/29/2015  . Type 2 diabetes mellitus with stage 4 chronic kidney disease, with long-term current use of insulin ([place]) 06/29/2015  . Chronic kidney disease, stage V ([place]) 04/12/2014  . 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 Acute respiratory failure with hypoxia ([place]) 03/15/2014  . Chronic diastolic heart failure ([place]) 03/15/2014  . Anemia of chronic renal failure, stage 4 (severe) ([place]) 02/23/2014  . Folate deficiency 02/17/2014  . Aortic regurgitation 09/19/2012  . Class 2 severe obesity due to excess calories with serious comorbidity and body mass  index (BMI) of 35.0 to 35.9 in adult ([facility]) 09/14/2012  . Type 2 diabetes mellitus with diabetic neuropathy ([place]) 09/14/2012  . SLE (systemic lupus erythematosus) ([place]) 09/14/2012  . Essential hypertension, benign 09/14/2012  . COPD (chronic obstructive pulmonary disease) ([place]) 09/14/2012    Past Medical History:  Past Medical History:  Diagnosis Date  . Anemia   . Arthritis   . Asthma   . Atrial fibrillation ([place])   . Atrial flutter [facility])    TEE/DCCV December 2013 - on Xarelto [facility])  . Bone spur of toe    Right 5th toe  . Chronic diastolic CHF (congestive heart failure) (HCC)    a. EF 50-55% by echo in 2015  . CKD (chronic kidney disease) stage 4, GFR 15-29 ml/min (HCC)    a. s/p fistula placement but not on HD. Followed by Nephrology.   . Colon polyposis   . COPD (chronic obstructive pulmonary disease) ([place])   . DVT of upper extremity (deep vein thrombosis) ([place])    Right basilic vein, June 2094  . Essential hypertension, benign   . Fractures   . Glaucoma   . SLE (systemic lupus erythematosus) ([place])   . Type 2 diabetes mellitus ([place])   . UTI (lower urinary tract infection)   . Wound of right leg 06/2017   Past Surgical History:  Past Surgical History:  Procedure Laterality Date  . ABDOMINAL AORTOGRAM W/LOWER EXTREMITY N/A 12/18/2017   Procedure: ABDOMINAL AORTOGRAM W/LOWER EXTREMITY;  Surgeon: Elam Dutch, MD;  Location: [facility];  Service: Cardiovascular;  Laterality: N/A;  bilateral  . ABDOMINAL HYSTERECTOMY  1983  . AMPUTATION  Right 06/08/2018   Procedure: RIGHT ABOVE KNEE AMPUTATION;  Surgeon: Elam Dutch, MD;  Location: [place] [facility];  Service: Vascular;  Laterality: Right;  . ANKLE SURGERY  1993  . AV FISTULA PLACEMENT Left 03/27/2014   Procedure: ARTERIOVENOUS (AV) FISTULA CREATION;  Surgeon: Rosetta Posner, MD;  Location: [facility];  Service: Vascular;  Laterality: Left;  . BACK SURGERY  1980  . BASCILIC VEIN TRANSPOSITION Left 01/13/2018   Procedure: LEFT  BASILIC VEIN TRANSPOSITION SECOND STAGE;  Surgeon: Elam Dutch, MD;  Location: [place];  Service: Vascular;  Laterality: Left;  . CARDIOVASCULAR STRESS TEST  12/19/2009   no stress induced rhythm abnormalities, ekg negative for ischemia  . CARDIOVERSION  09/16/2012   Procedure: CARDIOVERSION;  Surgeon: Sanda Klein, MD;  [facility];  Service: Cardiovascular;  Laterality: N/A;  . CATARACT EXTRACTION    . COLONOSCOPY  09/20/2012   Dr. [name]: multiple tubular adenomas   . COLONOSCOPY  2005   Dr. [name]. Polyps, path unknown   . COLONOSCOPY N/A 07/24/2016   Procedure: COLONOSCOPY;  Surgeon: [name], MD;  [facility];  Service: Endoscopy;  Laterality: N/A;  9:00 am  . ESOPHAGOGASTRODUODENOSCOPY  09/20/2012   Dr. [name]: duodenal erosion and possible resolving ulcer at the angularis   . ESOPHAGOGASTRODUODENOSCOPY N/A 03/24/2018   Procedure: ESOPHAGOGASTRODUODENOSCOPY (EGD);  Surgeon: [name], MD;  [facility];  Service: Endoscopy;  Laterality: N/A;  12:45pm- moved up to arrive at 8:30am per Tretha Sciara  . EYE SURGERY    . IR FLUORO GUIDE CV LINE RIGHT  11/09/2017  . IR US GUIDE VASC ACCESS RIGHT  11/09/2017  . REVISON OF ARTERIOVENOUS FISTULA Left 03/15/2018   Procedure: REVISION OF Left arm BASILIC VEIN TRANSPOSITION;  Surgeon: Angelia Mould, MD;  Location: [facility];  Service: Vascular;  Laterality: Left;  . TEE WITHOUT CARDIOVERSION  09/16/2012   Procedure: TRANSESOPHAGEAL ECHOCARDIOGRAM (TEE);  Surgeon: Sanda Klein, MD;  Location: [facility];  Service: Cardiovascular;  Laterality: N/A;  . TRANSESOPHAGEAL ECHOCARDIOGRAM WITH CARDIOVERSION  09/16/2012   EF 60-65%, moderate LVH, moderate regurg of the aortic valve, LA moderately dilated    Assessment & Plan Clinical Impression: Patient is a 72 y.o. year old female history of T2DM with neuropathy nephropathy and retinopathy, COPD, DVT, A. fib/a flutter, bilateral lower extremity wounds  with progression of right lower extremity except ulcers to calf and anterior leg,inability to walk and significant pain.Patient was finally agreeable to undergo surgical intervention and was admitted on 06/08/2018 for right AKA by Dr. Oneida Alar.   Patient transferred to CIR on 06/10/2018 .    Patient currently requires mod with basic self-care skills and basic mobiltiy  secondary to muscle weakness and acute pain, decreased cardiorespiratoy endurance and decreased sitting balance and decreased balance strategies.  Prior to hospitalization, patient could complete ADL with min.  Patient will benefit from skilled intervention to decrease level of assist with basic self-care skills and increase independence with basic self-care skills prior to discharge home with care partner.  Anticipate patient will require intermittent supervision and follow up home health.  OT - End of Session Activity Tolerance: Tolerates 30+ min activity with multiple rests Endurance Deficit: Yes Endurance Deficit Description: due to pain and has decr tolerance to a lot of activiyt OT Assessment Rehab Potential (ACUTE ONLY): Good OT Patient demonstrates impairments in the following area(s): Balance;Behavior;Nutrition;Pain;Edema;Endurance;Motor;Safety;Sensory OT Basic ADL's Functional Problem(s): Grooming;Bathing;Dressing;Toileting OT Transfers Functional Problem(s): Toilet OT Additional Impairment(s): None OT Plan  OT Intensity: Minimum of 1-2 x/day, 45 to 90 minutes OT Frequency: 5 out of 7 days OT Duration/Estimated Length of Stay: ~12-14 days OT Treatment/Interventions: Balance/vestibular training;Community reintegration;Disease mangement/prevention;Patient/family education;Self Care/advanced ADL retraining;Splinting/orthotics;Therapeutic Exercise;UE/LE Coordination activities;Wheelchair propulsion/positioning;UE/LE Strength taining/ROM;Therapeutic Activities;Skin care/wound managment;Psychosocial support;Pain  management;Functional mobility training;DME/adaptive equipment instruction;Discharge planning OT Self Feeding Anticipated Outcome(s): n/a OT Basic Self-Care Anticipated Outcome(s): supervision OT Toileting Anticipated Outcome(s): supervision OT Bathroom Transfers Anticipated Outcome(s): supervision OT Recommendation Recommendations for Other Services: Neuropsych consult Patient destination: Home Follow Up Recommendations: Home health OT Equipment Recommended: 3 in 1 bedside comode Equipment Details: wide drop arm BSC    Skilled Therapeutic Intervention OT eval initiated with goals, purpose and role discussed.  Pt reporting pain in residual limb today and taking meds for it when arrived. Pt rolled both directions for bed pan to be place to void with increased pain in residual limb requiring more time. Pt required total A for hygiene afterwards. Noted blood from vaginal area and alert RN to it. PT reports this has been going on for a month now. Transitioned to eOB with min A with extra time. Pt perform UB bathing and dressing with setup. Lateral leans with min to mod A due to pain for donning pants. Pt required total A to threading and pulling up pants. Mod A slide board transfer from EOB to bed with extra time. Pt able to perform all grooming tasks at sink. Pt with black fore foot wrapped right foot with nursing for protection - instead of the sock- discussed with RN to discuss with PA about proper footwear to protect foot.   Left sitting up in w/c at end of session.    OT Evaluation Precautions/Restrictions  Precautions Precautions: Fall;Other (comment) Precaution Comments: R AKA, L foot wounds Restrictions Weight Bearing Restrictions: No Other Position/Activity Restrictions: caution about trying to put weight through left LE due to wounds on forefoot General Chart Reviewed: Yes Family/Caregiver Present: No Vital Signs  Pain Pain Assessment Pain Scale: 0-10 Pain Score: 9  Pain  Type: Acute pain;Surgical pain Pain Location: Leg Pain Orientation: Right Pain Descriptors / Indicators: Aching Pain Frequency: Constant Pain Onset: On-going Pain Intervention(s): Medication (See eMAR) Home Living/Prior Functioning Home Living Family/patient expects to be discharged to:: Private residence Living Arrangements: Spouse/significant other Available Help at Discharge: Family, Available 24 hours/day Type of Home: House Home Access: Ramped entrance Home Layout: Two level Alternate Level Stairs-Number of Steps: 6- stair lift Alternate Level Stairs-Rails: Left Bathroom Shower/Tub: Multimedia programmer: Standard Bathroom Accessibility: Yes Additional Comments: Per pt, husband in good physical condition  Lives With: Spouse ADL ADL Eating: Set up Where Assessed-Eating: Wheelchair Grooming: Setup Where Assessed-Grooming: Sitting at sink Upper Body Bathing: Contact guard Where Assessed-Upper Body Bathing: Edge of bed Lower Body Bathing: Maximal assistance Upper Body Dressing: Setup Where Assessed-Upper Body Dressing: Edge of bed Lower Body Dressing: Maximal assistance Where Assessed-Lower Body Dressing: Edge of bed Toileting: Maximal assistance Where Assessed-Toileting: Bed level Vision Baseline Vision/History: Wears glasses Wears Glasses: Reading only Patient Visual Report: No change from baseline Additional Comments: left eye does deviate out Perception  Perception: Within Functional Limits Praxis Praxis: Intact Cognition Overall Cognitive Status: Within Functional Limits for tasks assessed Arousal/Alertness: Awake/alert Orientation Level: Person;Place;Situation Person: Oriented Place: Oriented Situation: Oriented Year: 2019 Month: September Day of Week: Correct Memory: Appears intact Immediate Memory Recall: Sock;Blue;Bed Memory Recall: Sock;Blue;Bed Memory Recall Sock: Without Cue Memory Recall Blue: Without Cue Memory Recall Bed: Without  Cue Attention: Selective Selective Attention: Appears intact  Awareness: Appears intact Problem Solving: Appears intact Safety/Judgment: Appears intact Sensation Sensation Light Touch: Impaired Detail Peripheral sensation comments: due to black foot Light Touch Impaired Details: Impaired LLE Proprioception: Appears Intact Coordination Gross Motor Movements are Fluid and Coordinated: Yes Fine Motor Movements are Fluid and Coordinated: Yes Motor  Motor Motor - Skilled Clinical Observations: acute pain and generalized weakness Mobility  Bed Mobility Bed Mobility: Rolling Right;Rolling Left;Right Sidelying to Sit Rolling Right: Minimal Assistance - Patient > 75% Rolling Left: Minimal Assistance - Patient > 75% Right Sidelying to Sit: Minimal Assistance - Patient > 75%  Trunk/Postural Assessment  Cervical Assessment Cervical Assessment: Within Functional Limits Thoracic Assessment Thoracic Assessment: Within Functional Limits(flexed forward) Lumbar Assessment Lumbar Assessment: (posterior pelvic tilt) Postural Control Postural Control: Within Functional Limits  Balance Balance Balance Assessed: Yes Static Sitting Balance Static Sitting - Balance Support: No upper extremity supported;Feet unsupported Static Sitting - Level of Assistance: 5: Stand by assistance Dynamic Sitting Balance Dynamic Sitting - Balance Support: Feet unsupported;During functional activity;No upper extremity supported Dynamic Sitting - Level of Assistance: 4: Min assist Extremity/Trunk Assessment RUE Assessment RUE Assessment: Within Functional Limits LUE Assessment LUE Assessment: Within Functional Limits   See Function Navigator for Current Functional Status.   Refer to Care Plan for Long Term Goals  Recommendations for other services: Neuropsych   Discharge Criteria: Patient will be discharged from OT if patient refuses treatment 3 consecutive times without medical reason, if treatment goals  not met, if there is a change in medical status, if patient makes no progress towards goals or if patient is discharged from hospital.  The above assessment, treatment plan, treatment alternatives and goals were discussed and mutually agreed upon: by patient  Nicoletta Ba 06/11/2018, 10:09 AM

## 2018-06-11 NOTE — Progress Notes (Signed)
[facility] PHYSICAL MEDICINE & REHABILITATION     PROGRESS NOTE  Subjective/Complaints:  Pt seen laying in bed this AM.  She states she slept well overnight.  Nursing with questions regarding dressing changes.   ROS: Denies CP, SOB, N/V/D.  Objective: Vital Signs: Blood pressure (!) 126/58, pulse 78, temperature 98.3 F (36.8 C), resp. rate 18, height 5\' 4"  (1.626 m), weight 63.3 kg, SpO2 100 %. No results found. Recent Labs    06/09/18 0344 06/10/18 0409  WBC 12.0* 9.4  HGB 7.7* 8.3*  HCT 26.8* 29.4*  PLT 276 252   Recent Labs    06/09/18 0344 06/10/18 0409  NA 136 135  K 3.9 3.7  CL 98 100  GLUCOSE 191* 174*  BUN 30* 47*  CREATININE 3.00* 4.38*  CALCIUM 7.7* 7.8*   CBG (last 3)  Recent Labs    06/10/18 2256 06/11/18 0617 06/11/18 1144  GLUCAP 153* 142* 122*    Wt Readings from Last 3 Encounters:  06/10/18 63.3 kg  06/10/18 65.4 kg  06/04/18 79.4 kg    Physical Exam:  BP (!) 126/58   Pulse 78   Temp 98.3 F (36.8 C)   Resp 18   Ht 5\' 4"  (1.626 m)   Wt 63.3 kg   SpO2 100%   BMI 23.95 kg/m  Constitutional: She has asickly appearance. NAD. HENT: normocephalic. Atraumatic Eyes: disconjugate gaze Cardiac: RRR. No JVD. Lungs: Clear to auscultation, breathing unlabored Abdomen: Positive bowel sounds, nondistended Musculoskeletal: Right BKA with edema Neurologic:  alert. Motor: 5/5 in bilateral deltoid, bicep, tricep, grip 4/5 left hip flexors, knee extensors, ankle dorsiflexor  Skin: Right BKA with staples C/D/I Left lower extremity digits with dry gangrene  Assessment/Plan: 1. Functional deficits secondary to right AKA with left lower extremity dry gangrene which require 3+ hours per day of interdisciplinary therapy in a comprehensive inpatient rehab setting. Physiatrist is providing close team supervision and 24 hour management of active medical problems listed below. Physiatrist and rehab team continue to assess barriers to discharge/monitor  patient progress toward functional and medical goals.  Function:  Bathing Bathing position   Position: Sitting EOB  Bathing parts Body parts bathed by patient: Right arm, Left arm, Chest, Abdomen Body parts bathed by helper: Front perineal area, Buttocks, Right upper leg, Left upper leg, Left lower leg, Back  Bathing assist Assist Level: Set up, Touching or steadying assistance(Pt > 75%)   Set up : To obtain items  Upper Body Dressing/Undressing Upper body dressing   What is the patient wearing?: Pull over shirt/dress     Pull over shirt/dress - Perfomed by patient: Thread/unthread right sleeve, Thread/unthread left sleeve, Put head through opening, Pull shirt over trunk          Upper body assist Assist Level: Supervision or verbal cues      Lower Body Dressing/Undressing Lower body dressing   What is the patient wearing?: Pants       Pants- Performed by helper: Thread/unthread right pants leg, Thread/unthread left pants leg, Pull pants up/down                      Lower body assist Assist for lower body dressing: Touching or steadying assistance (Pt > 75%)      Toileting Toileting     Toileting steps completed by helper: Adjust clothing prior to toileting, Performs perineal hygiene, Adjust clothing after toileting    Toileting assist Assist level: Touching or steadying assistance (Pt.75%)  Transfers Chair/bed transfer   Chair/bed transfer method: Lateral scoot Chair/bed transfer assist level: Moderate assist (Pt 50 - 74%/lift or lower) Chair/bed transfer assistive device: Sliding board     Locomotion Ambulation Ambulation activity did not occur: Safety/medical concerns         Wheelchair   Type: Manual Max wheelchair distance: 100' Assist Level: Touching or steadying assistance (Pt > 75%)  Cognition Comprehension Comprehension assist level: Understands basic 75 - 89% of the time/ requires cueing 10 - 24% of the time  Expression Expression  assist level: Expresses basic 75 - 89% of the time/requires cueing 10 - 24% of the time. Needs helper to occlude trach/needs to repeat words.  Social Interaction Social Interaction assist level: Interacts appropriately with others with medication or extra time (anti-anxiety, antidepressant).  Problem Solving Problem solving assist level: Solves basic 90% of the time/requires cueing < 10% of the time  Memory Memory assist level: Recognizes or recalls 90% of the time/requires cueing < 10% of the time    Medical Problem List and Plan: 1. Decline in functional mobility and ADLs  secondary to Right Above knee amputation  Begin CIR  Notes reviewed - progressive ischemic pain with ultimate right AKA, labs reviewed 2.H/oDVT/ A fib/anticoagulation:Pharmaceutical:Other (comment)--Eliquis. 3.ChronicPain Management:On fentanyl 50 mcg (100 mcg PTA?) with prn oxycodone.  4. Mood:LCSW to follow for evaluation and support. Seems to be accepting of thedecision forR-AKA.  5. Neuropsych: This patientis not fullycapable of making decisions onherown behalf. 6. Skin/Wound Care:Continue nephro/prostat supplements to promote healing. Monitor wounds for healing.   Patient will likely require left lower extremity amputation future 7. Fluids/Electrolytes/Nutrition:Daily weights with strict I/O. Labs with HD. 8. T2DM with neuropathy and retionpathy: Was on levemir 10 units daily--resumed 5 units daily. Monitor BS ac/hs. Continue SSI for tighter control.   Monitor with increased mobility 9. PVD with left foot wound: Question due to calciphylaxis v/s pyoderma gangrenosum.  10. A fib/Aflutter:Monitor heart rate twice daily. On Eliquis and beta-blocker twice daily 11. SLE: Continue plaquenil and low dose prednisone.  12. ESRD: Schedule HD on TTS at end of day to help with tolerance of therapy.  13. Anemia of chronic disease:Arransep weekly started on 9/12.   Hemoglobin 8.3 on 9/12  Continue to  monitor 14. H/o Glaucoma: Stable on Timpotic and Xalatan.  15. H/o gastritis/erosive gastropathy: Per EGD 6/26. Continue PPI. Serial H/H monitored with HD. 16. Prediabetes  Labile on 9/13  LOS (Days) 1 A FACE TO FACE EVALUATION WAS PERFORMED  Ankit Lorie Phenix 06/11/2018 1:14 PM

## 2018-06-11 NOTE — Care Management (Signed)
[facility] Individual Statement of Services  Patient Name:  [name]  Date:  06/11/2018  Welcome to the [facility].  Our goal is to provide you with an individualized program based on your diagnosis and situation, designed to meet your specific needs.  With this comprehensive rehabilitation program, you will be expected to participate in at least 3 hours of rehabilitation therapies Monday-Friday, with modified therapy programming on the weekends.  Your rehabilitation program will include the following services:  Physical Therapy (PT), Occupational Therapy (OT), Speech Therapy (ST), 24 hour per day rehabilitation nursing, Case Management (Social Worker), Rehabilitation Medicine, Nutrition Services and Pharmacy Services  Weekly team conferences will be held on Wednesday to discuss your progress.  Your Social Worker will talk with you frequently to get your input and to update you on team discussions.  Team conferences with you and your family in attendance may also be held.  Expected length of stay: 12-14 days  Overall anticipated outcome: supervision w/c level-min tolieting and car transfers  Depending on your progress and recovery, your program may change. Your Social Worker will coordinate services and will keep you informed of any changes. Your Social Worker's name and contact numbers are listed  below.  The following services may also be recommended but are not provided by the [facility]:    [place]ante will be made to provide these services after discharge if needed.  Arrangements include referral to agencies that provide these services.  Your insurance has been verified to be:  [employer]_Medicare Your primary doctor is:  Sharilyn Sites  Pertinent information will be shared with your doctor and your insurance company.  Social Worker:  Ovidio Kin, [place] or ([phone]  Information discussed with and copy given to patient by: Elease Hashimoto, 06/11/2018, 10:53 AM

## 2018-06-11 NOTE — Progress Notes (Signed)
Occupational Therapy Session Note  Patient Details  Name: [name] MRN: [id number] Date of Birth: 06-22-1946  Today's Date: 06/11/2018 OT Individual Time: 1300-1400 OT Individual Time Calculation (min): 60 min    Short Term Goals: Week 1:  OT Short Term Goal 1 (Week 1): Pt will perform transfer to wide drop arm BSC with mod A  OT Short Term Goal 2 (Week 1): Pt will perform lateral leans with min A while pulling up pants OT Short Term Goal 3 (Week 1): Pt will don pants with mod A (2/3 steps) EOB OT Short Term Goal 4 (Week 1): Pt will setup w/c in prep for transfers with supervision with mod cues  Skilled Therapeutic Interventions/Progress Updates:    Pt received in w/c and agreeable to practicing [facility] transfers using slide board and drop arm BSC.  Pt was able to transfer to toilet with mod -max A with cues for angling shoulders in relation to hips.  She worked on lateral leans with 75% A to pull pants down.  Pt had a BM was able to cleanse self most of the way, but therapist continued for thoroughness.  Pt worked on leaning again for therapist to readjust pants.  Pt had more difficulty transferring back as her pants kept getting caught on the edge of the board. Pt felt it would be easier to do it without the board as the edge of the w/c is so close to the edge of the [facility] seat.  Pt then completed board transfer to bed with mod A.  Set up and adjusted in bed so she could eat her late lunch.  Pt in bed with alarm set and all needs met.  Therapy Documentation Precautions:  Precautions Precautions: Fall, Other (comment) Precaution Comments: R AKA, L foot wounds Restrictions Weight Bearing Restrictions: Yes RLE Weight Bearing: Non weight bearing Other Position/Activity Restrictions: caution about trying to put weight through left LE due to wounds on forefoot    Vital Signs: Therapy Vitals Temp: 97.7 F (36.5 C) Pulse Rate: 73 BP: (!) 145/49 Patient Position (if appropriate):  Lying Pain: Pain Assessment Pain Scale: 0-10 Pain Score: 9  Pain Type: Acute pain;Surgical pain Pain Location: Leg Pain Orientation: Right Pain Descriptors / Indicators: Aching Pain Frequency: Constant Pain Onset: On-going Pain Intervention(s): Medication (See eMAR) ADL:   See Function Navigator for Current Functional Status.   Therapy/Group: Individual Therapy  [place] 06/11/2018, 2:10 PM

## 2018-06-11 NOTE — Progress Notes (Signed)
Social Work  Social Work Assessment and Plan  Patient Details  Name: [name] MRN: [id number] Date of Birth: 1945/12/24  Today's Date: 06/11/2018  Problem List:  Patient Active Problem List   Diagnosis Date Noted  . Unilateral AKA, right ([place])   . Post-operative pain   . Diabetic retinopathy associated with type 2 diabetes mellitus ([place])   . Diabetic nephropathy associated with type 2 diabetes mellitus ([place])   . Leukocytosis   . Non-healing wound of lower extremity 06/08/2018  . Malnutrition of moderate degree 03/03/2018  . Leg wound, right 03/01/2018  . Ulcer of right calf ([place]) 02/10/2018  . Chronic RLQ pain 01/11/2018  . Ischemic ulcer of toes on both feet ([place])   . Cellulitis in diabetic foot ([place]) 12/14/2017  . Atrial fibrillation ([place]) 12/14/2017  . Glaucoma 12/14/2017  . ESRD (end stage renal disease) on dialysis ([place]) 11/16/2017  . COPD with acute exacerbation ([place]) 11/03/2017  . Community acquired pneumonia 11/02/2017  . Nausea and vomiting 11/02/2017  . Iron deficiency anemia 08/07/2017  . Heme positive stool 10/04/2015  . Mixed hyperlipidemia 06/29/2015  . Type 2 diabetes mellitus with stage 4 chronic kidney disease, with long-term current use of insulin ([place]) 06/29/2015  . Chronic kidney disease, stage V ([place]) 04/12/2014  . 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 Acute respiratory failure with hypoxia ([place]) 03/15/2014  . Chronic diastolic heart failure ([place]) 03/15/2014  . Anemia of chronic renal failure, stage 4 (severe) ([place]) 02/23/2014  . Folate deficiency 02/17/2014  . Aortic regurgitation 09/19/2012  . Class 2 severe obesity due to excess calories with serious comorbidity and body mass index (BMI) of 35.0 to 35.9 in adult ([place]) 09/14/2012  . Type 2 diabetes mellitus with diabetic neuropathy ([place]) 09/14/2012  . SLE (systemic lupus erythematosus) ([place]) 09/14/2012  . Essential hypertension,  benign 09/14/2012  . COPD (chronic obstructive pulmonary disease) ([place]) 09/14/2012   Past Medical History:  Past Medical History:  Diagnosis Date  . Anemia   . Arthritis   . Asthma   . Atrial fibrillation ([place])   . Atrial flutter [facility])    TEE/DCCV December 2013 - on Xarelto [facility])  . Bone spur of toe    Right 5th toe  . Chronic diastolic CHF (congestive heart failure) (HCC)    a. EF 50-55% by echo in 2015  . CKD (chronic kidney disease) stage 4, GFR 15-29 ml/min (HCC)    a. s/p fistula placement but not on HD. Followed by Nephrology.   . Colon polyposis   . COPD (chronic obstructive pulmonary disease) ([place])   . DVT of upper extremity (deep vein thrombosis) ([place])    Right basilic vein, June 2694  . Essential hypertension, benign   . Fractures   . Glaucoma   . SLE (systemic lupus erythematosus) ([place])   . Type 2 diabetes mellitus ([place])   . UTI (lower urinary tract infection)   . Wound of right leg 06/2017   Past Surgical History:  Past Surgical History:  Procedure Laterality Date  . ABDOMINAL AORTOGRAM W/LOWER EXTREMITY N/A 12/18/2017   Procedure: ABDOMINAL AORTOGRAM W/LOWER EXTREMITY;  Surgeon: Elam Dutch, MD;  Location: [place] CV [facility];  Service: Cardiovascular;  Laterality: N/A;  bilateral  . ABDOMINAL HYSTERECTOMY  1983  . AMPUTATION Right 06/08/2018   Procedure: RIGHT ABOVE KNEE AMPUTATION;  Surgeon: Elam Dutch, MD;  Location: [place];  Service: Vascular;  Laterality: Right;  . ANKLE SURGERY  1993  .  AV FISTULA PLACEMENT Left 03/27/2014   Procedure: ARTERIOVENOUS (AV) FISTULA CREATION;  Surgeon: Rosetta Posner, MD;  Location: [place];  Service: Vascular;  Laterality: Left;  . BACK SURGERY  1980  . BASCILIC VEIN TRANSPOSITION Left 01/13/2018   Procedure: LEFT BASILIC VEIN TRANSPOSITION SECOND STAGE;  Surgeon: Elam Dutch, MD;  Location: [place];  Service: Vascular;  Laterality: Left;  . CARDIOVASCULAR STRESS TEST  12/19/2009   no stress induced rhythm  abnormalities, ekg negative for ischemia  . CARDIOVERSION  09/16/2012   Procedure: CARDIOVERSION;  Surgeon: Sanda Klein, MD;  Location[facility];  Service: Cardiovascular;  Laterality: N/A;  . CATARACT EXTRACTION    . COLONOSCOPY  09/20/2012   Dr. [name]: multiple tubular adenomas   . COLONOSCOPY  2005   Dr. [name]. Polyps, path unknown   . COLONOSCOPY N/A 07/24/2016   Procedure: COLONOSCOPY;  Surgeon: [name], MD;  [facility];  Service: Endoscopy;  Laterality: N/A;  9:00 am  . ESOPHAGOGASTRODUODENOSCOPY  09/20/2012   Dr. [name]: duodenal erosion and possible resolving ulcer at the angularis   . ESOPHAGOGASTRODUODENOSCOPY N/A 03/24/2018   Procedure: ESOPHAGOGASTRODUODENOSCOPY (EGD);  Surgeon: [name], MD;  [facility];  Service: Endoscopy;  Laterality: N/A;  12:45pm- moved up to arrive at 8:30am per Tretha Sciara  . EYE SURGERY    . IR FLUORO GUIDE CV LINE RIGHT  11/09/2017  . IR US GUIDE VASC ACCESS RIGHT  11/09/2017  . REVISON OF ARTERIOVENOUS FISTULA Left 03/15/2018   Procedure: REVISION OF Left arm BASILIC VEIN TRANSPOSITION;  Surgeon: Angelia Mould, MD;  Location: [place];  Service: Vascular;  Laterality: Left;  . TEE WITHOUT CARDIOVERSION  09/16/2012   Procedure: TRANSESOPHAGEAL ECHOCARDIOGRAM (TEE);  Surgeon: Sanda Klein, MD;  Location[facility] END[facility]COPY;  Service: Cardiovascular;  Laterality: N/A;  . TRANSESOPHAGEAL ECHOCARDIOGRAM WITH CARDIOVERSION  09/16/2012   EF 60-65%, moderate LVH, moderate regurg of the aortic valve, LA moderately dilated   Social History:  reports that she quit smoking about 5 years ago. Her smoking use included cigarettes. She has a 30.00 pack-year smoking history. She has never used smokeless tobacco. She reports that she does not drink alcohol or use drugs.  Family / Support Systems Marital Status: Married Patient Roles: Spouse, Other (Comment)(Aunt) Spouse/Significant Other: Izell [place] [phone]   [phone] Other Supports: Nieces here visiting Anticipated Caregiver: Husband and niece Ability/Limitations of Caregiver: Was providing assist with transfers prior to admission and niece was assisting with ADL's Caregiver Availability: 24/7 Family Dynamics: Close knit family both she and husband rely upon each other and will do whatever the other ones needs. They have extened family who are involved and supportive. They also assist them. She has church members who visit and provide support.  Social History Preferred language: English Religion: None Cultural Background: No issues Education: High School Read: Yes Write: Yes Employment Status: Disabled Freight forwarder Issues: No issues Guardian/Conservator: None-according to MD pt is not fully capable of making her own decisions here, will look toward her husband to make any decision while here   Abuse/Neglect Abuse/Neglect Assessment Can Be Completed: Yes Physical Abuse: Denies Verbal Abuse: Denies Sexual Abuse: Denies Exploitation of patient/patient's resources: Denies Self-Neglect: Denies  Emotional Status Pt's affect, behavior adn adjustment status: Pt is motivated to do well and be able to do as much as she can for herself. She is grateful for her husband who has been assisting her prior to her amputation. She is still having pain but  not as much as she was before the surgery. She is trying to keep her other leg and wants it to heal. Recent Psychosocial Issues: other health issues Pyschiatric History: No history deferred depression due to able to verbalize her concerns and feelings. She is here to recover and then back home. Will monitor and ask team if would benefit from seeing neuro-psych while here Substance Abuse History: No issues  Patient / Family Perceptions, Expectations & Goals Pt/Family understanding of illness & functional limitations: Pt and nieces can explain her amputation and reason for it. She is  hoping this will be better than she was before due to the pain she was in. She and husband have spoken with the MD and feel they have a good understanding of her treatment plan going forward. Premorbid pt/family roles/activities: Wife, aunt, retiree, dialysis pt, church member, etc Anticipated changes in roles/activities/participation: resume Pt/family expectations/goals: Pt states: " I want to be able to help some, my husband has been doing the most of my care."  Husband states: " I will do whatever she needs she would for me."  [employer]: Other (Comment)(Wound Clinic for B LE wounds) Premorbid Home Care/DME Agencies: Other (Comment)(has HH ? agency name and has DME) Transportation available at discharge: Husband and extended family Resource referrals recommended: Support group (specify)  Discharge Planning Living Arrangements: Spouse/significant other Support Systems: Spouse/significant other, Other relatives, Friends/neighbors, Social worker community Type of Residence: Private residence Insurance Resources: Multimedia programmer (specify)(UHC-Medicare) Museum/gallery curator Resources: Social Security, Family Support Financial Screen Referred: No Living Expenses: Own Money Management: Spouse Does the patient have any problems obtaining your medications?: No Home Management: Husband and nieces Patient/Family Preliminary Plans: Return home with husband who was assisting prior to admission with transfers, transportation and tolieting. Niece was assisting wioth her bathing and dressing. Will await team's evaluations and work on discharge needs.  Social Work Anticipated Follow Up Needs: HH/OP  Clinical Impression Pleasant motivated patient who is trying to save her other leg but that leg also has wounds that need to heal. Her husband has been caring for her and also her niece prior to admission due to she was not ambulating for the last few months. Will work on discharge needs and  figure out the [facility] agency seeing her at home.  Elease Hashimoto 06/11/2018, 10:49 AM

## 2018-06-11 NOTE — Progress Notes (Signed)
Left foot dressing changed and dry gangrene noted.  Patient with concerns about losing left foot and questioned what was being done to heal/treat it. Discussed lack of circulation leading to progressive decline, inability to reverse gangrenous changes and current plans to monitor. She reports ongoing pain and advised her that pain will likely be the determining factor.   _

## 2018-06-12 ENCOUNTER — Inpatient Hospital Stay (HOSPITAL_COMMUNITY): Payer: Medicare Other | Admitting: Physical Therapy

## 2018-06-12 ENCOUNTER — Inpatient Hospital Stay (HOSPITAL_COMMUNITY): Payer: Medicare Other

## 2018-06-12 DIAGNOSIS — R030 Elevated blood-pressure reading, without diagnosis of hypertension: Secondary | ICD-10-CM

## 2018-06-12 LAB — GLUCOSE, CAPILLARY
GLUCOSE-CAPILLARY: 154 mg/dL — AB (ref 70–99)
GLUCOSE-CAPILLARY: 124 mg/dL — AB (ref 70–99)
GLUCOSE-CAPILLARY: 81 mg/dL (ref 70–99)
Glucose-Capillary: 68 mg/dL — ABNORMAL LOW (ref 70–99)
Glucose-Capillary: 153 mg/dL — ABNORMAL HIGH (ref 70–99)

## 2018-06-12 LAB — CBC
HEMATOCRIT: 30.2 % — AB (ref 36.0–46.0)
Hemoglobin: 8.6 g/dL — ABNORMAL LOW (ref 12.0–15.0)
MCH: 26.8 pg (ref 26.0–34.0)
MCHC: 28.5 g/dL — AB (ref 30.0–36.0)
MCV: 94.1 fL (ref 78.0–100.0)
PLATELETS: 269 10*3/uL (ref 150–400)
RBC: 3.21 MIL/uL — ABNORMAL LOW (ref 3.87–5.11)
RDW: 17.4 % — AB (ref 11.5–15.5)
WBC: 10.9 10*3/uL — ABNORMAL HIGH (ref 4.0–10.5)

## 2018-06-12 LAB — IRON AND TIBC
Iron: 30 ug/dL (ref 28–170)
Saturation Ratios: 23 % (ref 10.4–31.8)
TIBC: 132 ug/dL — ABNORMAL LOW (ref 250–450)
UIBC: 102 ug/dL

## 2018-06-12 LAB — RENAL FUNCTION PANEL
ALBUMIN: 1.7 g/dL — AB (ref 3.5–5.0)
Anion gap: 12 (ref 5–15)
BUN: 53 mg/dL — AB (ref 8–23)
CHLORIDE: 95 mmol/L — AB (ref 98–111)
CO2: 24 mmol/L (ref 22–32)
CREATININE: 4.82 mg/dL — AB (ref 0.44–1.00)
Calcium: 8 mg/dL — ABNORMAL LOW (ref 8.9–10.3)
GFR calc Af Amer: 10 mL/min — ABNORMAL LOW (ref >60)
GFR calc non Af Amer: 8 mL/min — ABNORMAL LOW (ref >60)
GLUCOSE: 163 mg/dL — AB (ref 70–99)
POTASSIUM: 4.3 mmol/L (ref 3.5–5.1)
Phosphorus: 4.3 mg/dL (ref 2.5–4.6)
Sodium: 131 mmol/L — ABNORMAL LOW (ref 135–145)

## 2018-06-12 LAB — FERRITIN: Ferritin: 1227 ng/mL — ABNORMAL HIGH (ref 11–307)

## 2018-06-12 NOTE — Plan of Care (Signed)
  Problem: RH SKIN INTEGRITY Goal: RH STG SKIN FREE OF INFECTION/BREAKDOWN Description Skin to remain free from infection and breakdown with mod assist while on rehab.  Outcome: Not Progressing; pain surgical site; lt foot questionable likely to amputate in future per progress notes

## 2018-06-12 NOTE — Progress Notes (Signed)
Physical Therapy Session Note  Patient Details  Name: [name] MRN: [id number] Date of Birth: 04/09/1946  Today's Date: 06/12/2018 PT Individual Time: 1450-1602 PT Individual Time Calculation (min): 72 min   Short Term Goals: Week 1:  PT Short Term Goal 1 (Week 1): Pt will perform bed mobility with Supervision PT Short Term Goal 2 (Week 1): Pt will perform least restrictive transfer with min A PT Short Term Goal 3 (Week 1): Pt will propel w/c x 100 ft with Supervision  Skilled Therapeutic Interventions/Progress Updates:      Pt received sitting in WC and agreeable to PT  WC mobility x 135f (1026f+50 after short rest break). Supervision assist from PT with cues for proper turning technique and doorway management.   SB transfer with Min assist to and from mat table and increased time due to anxiety and pain in the RLE. Min cues from PT for proper UE placement as well as head/hips relationship.   Sit>supine with close supervision assist from PT. Pt self selected to transition to supine through long sitting.   PT instructed pt in supine therex: prolonged hip flexor stretch to neutral 2 x 45 sec bil. SAQ, LLE x12, SLR LLEx 8, hip flexion RLE x8. Hip abduction BLE 2x 10, bridge through thighs on bolster 2 x6. Supine>sit with mod assist due to pain in the RLE and anxiety.   Pt returned to room and performed SB transfer to bed with mod assist for safety. Sit>supine completed with supervision assist, and left supine in bed with call bell in reach and all needs met.        Therapy Documentation Precautions:  Precautions Precautions: Fall, Other (comment) Precaution Comments: R AKA, L foot wounds Restrictions Weight Bearing Restrictions: Yes RLE Weight Bearing: Non weight bearing Other Position/Activity Restrictions: caution about trying to put weight through left LE due to wounds on forefoot Vital Signs: Therapy Vitals Temp: 97.7 F (36.5 C) Temp Source: Oral Pulse  Rate: 85 Resp: 18 BP: (!) 128/43 Patient Position (if appropriate): Sitting Oxygen Therapy SpO2: 100 % O2 Device: Room Air Pain: Pain Assessment Pain Scale: 0-10 Pain Score: 2  Pain Type: Acute pain;Surgical pain Pain Location: Leg Pain Orientation: Right Pain Descriptors / Indicators: Constant;Aching;Throbbing Pain Onset: On-going Pain Intervention(s): Medication (See eMAR)   See Function Navigator for Current Functional Status.   Therapy/Group: Individual Therapy  AuLorie Phenix/14/2019, 4:07 PM

## 2018-06-12 NOTE — Progress Notes (Signed)
Occupational Therapy Session Note  Patient Details  Name: [name] MRN: [id number] Date of Birth: 06/13/1946  Today's Date: 06/12/2018 OT Individual Time: 1300-1413 OT Individual Time Calculation (min): 73 min    Short Term Goals: Week 1:  OT Short Term Goal 1 (Week 1): Pt will perform transfer to wide drop arm BSC with mod A  OT Short Term Goal 2 (Week 1): Pt will perform lateral leans with min A while pulling up pants OT Short Term Goal 3 (Week 1): Pt will don pants with mod A (2/3 steps) EOB OT Short Term Goal 4 (Week 1): Pt will setup w/c in prep for transfers with supervision with mod cues  Skilled Therapeutic Interventions/Progress Updates:    1;1. Pt reporting 8/10 pain originally and Rn delivers pain medication. Pt completes supine>sitting EOB with A for LLE and trunk managemetn. Pt requires VC and min A for reciprocal scooting to EOM. Pt completes slide board transfer EOB>w/c<>EOM with mod A fading to min A to mat with VC for head hips relationship as well as hand placement for more efficient scooting across board. Pt leans laterally on mat to obtain horse shoes in B directions and pitch at target anterior to pt in prep for clothing management for toileting and LB dressing. Pt propels w/c back to room with VC for w/c propulsion steering as pt veering to L. Exited session with pt seated in w/c, call light in reach and all needs met awaiting next PT session.  Therapy Documentation Precautions:  Precautions Precautions: Fall, Other (comment) Precaution Comments: R AKA, L foot wounds Restrictions Weight Bearing Restrictions: Yes RLE Weight Bearing: Non weight bearing Other Position/Activity Restrictions: caution about trying to put weight through left LE due to wounds on forefoot General:   Vital Signs:  Pain: Pain Assessment Pain Scale: 0-10 Pain Score: 8  Pain Type: Acute pain;Surgical pain Pain Location: Leg Pain Orientation: Right Pain Descriptors /  Indicators: Constant;Aching;Throbbing Pain Onset: On-going Pain Intervention(s): Medication (See eMAR)  See Function Navigator for Current Functional Status.   Therapy/Group: Individual Therapy   M  06/12/2018, 1:35 PM 

## 2018-06-12 NOTE — Progress Notes (Signed)
Subjective: Interval History: has no complaint ,getting stronger.  Objective: Vital signs in last 24 hours: Temp:  [97.7 F (36.5 C)-98.6 F (37 C)] 98.6 F (37 C) (09/14 0509) Pulse Rate:  [73-83] 83 (09/14 0509) Resp:  [14-18] 16 (09/14 0509) BP: (126-160)/(43-58) 149/43 (09/14 0509) SpO2:  [99 %-100 %] 99 % (09/14 0509) Weight:  [63.2 kg-64.2 kg] 64.2 kg (09/14 0509) Weight change: -0.1 kg  Intake/Output from previous day: 09/13 0701 - 09/14 0700 In: 600 [P.O.:600] Out: -  Intake/Output this shift: No intake/output data recorded.  General appearance: alert, cooperative, no distress and mildly obese Resp: clear to auscultation bilaterally Chest wall: RIJ PC.   Cardio: regular rate and rhythm and systolic murmur: systolic ejection 2/6, crescendo and decrescendo at 2nd left intercostal space GI: obese, pos bs, liver down 4 cm Extremities: R BKA, dressing L foot  Lab Results: Recent Labs    06/10/18 0409  WBC 9.4  HGB 8.3*  HCT 29.4*  PLT 252   BMET:  Recent Labs    06/10/18 0409  NA 135  K 3.7  CL 100  CO2 24  GLUCOSE 174*  BUN 47*  CREATININE 4.38*  CALCIUM 7.8*   No results for input(s): PTH in the last 72 hours. Iron Studies: No results for input(s): IRON, TIBC, TRANSFERRIN, FERRITIN in the last 72 hours.  Studies/Results: No results found.  I have reviewed the patient's current medications.  Assessment/Plan: 1 ESRD for HD,  2 HTN lower vol 3 DM controlled 4 PVD post amp per rehab 5 CAD 6 Anemia stable 7 HPTH meds P REhab, HD, esa, control DM    LOS: 2 days   Jeneen Rinks Albino Bufford 06/12/2018,8:50 AM

## 2018-06-12 NOTE — Progress Notes (Signed)
Dialysis notified 2x that patient will be done therapy by 4pm.

## 2018-06-12 NOTE — Progress Notes (Signed)
[place] [facility]ICINE & RE[facility]ABILITATION     PROGRESS NOTE  Subjective/Complaints:  Patient seen sitting up in bed this morning. She states she slept well overnight. She states she had a good first in therapies yesterday.  ROS: Denies CP, SOB, N/V/D.  Objective: Vital Signs: Blood pressure (!) 149/43, pulse 83, temperature 98.6 F (37 C), temperature source Oral, resp. rate 16, height 5\' 4"  (1.626 m), weight 64.2 kg, SpO2 99 %. No results found. Recent Labs    06/10/18 0409  WBC 9.4  HGB 8.3*  HCT 29.4*  PLT 252   Recent Labs    06/10/18 0409  NA 135  K 3.7  CL 100  GLUCOSE 174*  BUN 47*  CREATININE 4.38*  CALCIUM 7.8*   CBG (last 3)  Recent Labs    06/11/18 2106 06/12/18 0640 06/12/18 1211  GLUCAP 140* 154* 124*    Wt Readings from Last 3 Encounters:  06/12/18 64.2 kg  06/10/18 65.4 kg  06/04/18 79.4 kg    Physical Exam:  BP (!) 149/43 (BP Location: Right Arm)   Pulse 83   Temp 98.6 F (37 C) (Oral)   Resp 16   Ht 5\' 4"  (1.626 m)   Wt 64.2 kg   SpO2 99%   BMI 24.29 kg/m  Constitutional: She has asickly appearance. NAD. HENT: normocephalic. Atraumatic Eyes: disconjugate gaze Cardiac: RRR. No JVD. Lungs: clear. Unlabored. Abdomen: Positive bowel sounds, nondistended Musculoskeletal: Right AKA with edema Neurologic:  alert. Motor:  4/5 left hip flexors, knee extensors, ankle dorsiflexor, stable  4+/5 right hip flexion Skin: Right AKA with dressing C/D/I Left lower extremity digits with dry gangrene  Assessment/Plan: 1. Functional deficits secondary to right AKA with left lower extremity dry gangrene which require 3+ hours per day of interdisciplinary therapy in a comprehensive inpatient rehab setting. Physiatrist is providing close team supervision and 24 hour management of active medical problems listed below. Physiatrist and rehab team continue to assess barriers to discharge/monitor patient progress toward functional and medical  goals.  Function:  Bathing Bathing position   Position: Sitting EOB  Bathing parts Body parts bathed by patient: Right arm, Left arm, Chest, Abdomen Body parts bathed by helper: Front perineal area, Buttocks, Right upper leg, Left upper leg, Left lower leg, Back  Bathing assist Assist Level: Set up, Touching or steadying assistance(Pt > 75%)   Set up : To obtain items  Upper Body Dressing/Undressing Upper body dressing   What is the patient wearing?: Pull over shirt/dress     Pull over shirt/dress - Perfomed by patient: Thread/unthread right sleeve, Thread/unthread left sleeve, Put head through opening, Pull shirt over trunk          Upper body assist Assist Level: Supervision or verbal cues      Lower Body Dressing/Undressing Lower body dressing   What is the patient wearing?: Pants       Pants- Performed by helper: Thread/unthread right pants leg, Thread/unthread left pants leg, Pull pants up/down                      Lower body assist Assist for lower body dressing: Touching or steadying assistance (Pt > 75%)      Toileting Toileting     Toileting steps completed by helper: Adjust clothing prior to toileting, Performs perineal hygiene, Adjust clothing after toileting    Toileting assist Assist level: Touching or steadying assistance (Pt.75%)   Transfers Chair/bed transfer   Chair/bed transfer method: Lateral  scoot Chair/bed transfer assist level: Moderate assist (Pt 50 - 74%/lift or lower) Chair/bed transfer assistive device: Sliding board     Locomotion Ambulation Ambulation activity did not occur: Safety/medical concerns         Wheelchair   Type: Manual Max wheelchair distance: 100' Assist Level: Touching or steadying assistance (Pt > 75%)  Cognition Comprehension Comprehension assist level: Understands basic 75 - 89% of the time/ requires cueing 10 - 24% of the time  Expression Expression assist level: Expresses basic 75 - 89% of the  time/requires cueing 10 - 24% of the time. Needs helper to occlude trach/needs to repeat words.  Social Interaction Social Interaction assist level: Interacts appropriately with others with medication or extra time (anti-anxiety, antidepressant).  Problem Solving Problem solving assist level: Solves basic 90% of the time/requires cueing < 10% of the time  Memory Memory assist level: Recognizes or recalls 90% of the time/requires cueing < 10% of the time    Medical Problem List and Plan: 1. Decline in functional mobility and ADLs  secondary to Right Above knee amputation  Cont CIR 2.H/oDVT/ A fib/anticoagulation:Pharmaceutical:Other (comment)--Eliquis. 3.ChronicPain Management:On fentanyl 50 mcg (100 mcg PTA?) with prn oxycodone.  4. Mood:LCSW to follow for evaluation and support. Seems to be accepting of thedecision forR-AKA.  5. Neuropsych: This patientis not fullycapable of making decisions onherown behalf. 6. Skin/Wound Care:Continue nephro/prostat supplements to promote healing. Monitor wounds for healing.   Patient will likely require left lower extremity amputation future 7. Fluids/Electrolytes/Nutrition:Daily weights with strict I/O. Labs with HD. 8. T2DM with neuropathy and retionpathy: Was on levemir 10 units daily--resumed 5 units daily. Monitor BS ac/hs. Continue SSI for tighter control.   Slightly elevated on 9/14 9. PVD with left foot wound: Question due to calciphylaxis v/s pyoderma gangrenosum.  10. A fib/Aflutter:Monitor heart rate twice daily. On Eliquis and beta-blocker twice daily 11. SLE: Continue plaquenil and low dose prednisone.  12. ESRD: Schedule HD on TTS at end of day to help with tolerance of therapy.  13. Anemia of chronic disease:Arransep weekly started on 9/12.   Hemoglobin 8.3 on 9/12  Continue to monitor 14. H/o Glaucoma: Stable on Timpotic and Xalatan.  15. H/o gastritis/erosive gastropathy: Per EGD 6/26. Continue PPI. Serial H/H  monitored with HD. 16. Elevated blood pressure  Continue to monitor  LOS (Days) 2 A FACE TO FACE EVALUATION WAS PERFORMED  Dashiell Franchino Lorie Phenix 06/12/2018 12:38 PM

## 2018-06-12 NOTE — Progress Notes (Signed)
Occupational Therapy Session Note  Patient Details  Name: [name] MRN: [id number] Date of Birth: 1946/04/07  Today's Date: 06/12/2018 OT Individual Time: 7412-8786 OT Individual Time Calculation (min): 35 min    Short Term Goals: Week 1:  OT Short Term Goal 1 (Week 1): Pt will perform transfer to wide drop arm BSC with mod A  OT Short Term Goal 2 (Week 1): Pt will perform lateral leans with min A while pulling up pants OT Short Term Goal 3 (Week 1): Pt will don pants with mod A (2/3 steps) EOB OT Short Term Goal 4 (Week 1): Pt will setup w/c in prep for transfers with supervision with mod cues  Skilled Therapeutic Interventions/Progress Updates:    Pt on phone, in bed, eating breakfast upon arrival missing 10 min d/t unwilling to end phone conversation despite OT presence. Ot facilitates question cues to assist pt reading schedule when reporting to family member on phone. Pt applies lotion to LLE in bed and supine>sitting EOB for dressing with MOD A. Pt guarded d/t pain, however able to reach bag placed on floor in min-mod ranges outside BOS to obtain clothing. Pt able to gather apprpriate clothing for dressing this morning with CGA. Pt dons shirt noticing error when donning backwards without cue. Pt reports needing to have BM, however BSC does not have bucket that fits. D/t time constraint and energy conservation pt completes rolling with VC for bed pan placement.  Therapy Documentation Precautions:  Precautions Precautions: Fall, Other (comment) Precaution Comments: R AKA, L foot wounds Restrictions Weight Bearing Restrictions: Yes RLE Weight Bearing: Non weight bearing Other Position/Activity Restrictions: caution about trying to put weight through left LE due to wounds on forefoot General:  See Function Navigator for Current Functional Status.   Therapy/Group: Individual Therapy  Tonny Branch 06/12/2018, 8:26 AM

## 2018-06-12 NOTE — Progress Notes (Signed)
Hypoglycemic Event  CBG: 68  Treatment: snack / dinner  Symptoms: none  Follow-up CBG: Time:1737 CBG Result: 81  Possible Reasons for Event: pt claims food is not good ; refuses glucerna  Comments/MD notified:per protocol    Jillyn Ledger

## 2018-06-13 ENCOUNTER — Inpatient Hospital Stay (HOSPITAL_COMMUNITY): Payer: Medicare Other

## 2018-06-13 DIAGNOSIS — D72829 Elevated white blood cell count, unspecified: Secondary | ICD-10-CM

## 2018-06-13 LAB — GLUCOSE, CAPILLARY
GLUCOSE-CAPILLARY: 138 mg/dL — AB (ref 70–99)
GLUCOSE-CAPILLARY: 187 mg/dL — AB (ref 70–99)
GLUCOSE-CAPILLARY: 153 mg/dL — AB (ref 70–99)
Glucose-Capillary: 140 mg/dL — ABNORMAL HIGH (ref 70–99)

## 2018-06-13 MED ORDER — INSULIN DETEMIR 100 UNIT/ML ~~LOC~~ SOLN
10.0000 [IU] | Freq: Every day | SUBCUTANEOUS | Status: DC
  Administered 2018-06-14 – 2018-06-17 (×4): 10 [IU] via SUBCUTANEOUS
  Filled 2018-06-13 (×4): qty 0.1

## 2018-06-13 NOTE — Progress Notes (Signed)
Occupational Therapy Session Note  Patient Details  Name: [name] MRN: [id number] Date of Birth: 1946/07/24  Today's Date: 06/13/2018 OT Individual Time: 5300-5110 OT Individual Time Calculation (min): 50 min    Short Term Goals: Week 1:  OT Short Term Goal 1 (Week 1): Pt will perform transfer to wide drop arm BSC with mod A  OT Short Term Goal 2 (Week 1): Pt will perform lateral leans with min A while pulling up pants OT Short Term Goal 3 (Week 1): Pt will don pants with mod A (2/3 steps) EOB OT Short Term Goal 4 (Week 1): Pt will setup w/c in prep for transfers with supervision with mod cues  Skilled Therapeutic Interventions/Progress Updates:    1:1. Pt received with family in the room. Pt agreeable to practice toileting on [facility] with encouragement. With 4" step under L heel, Pt completes slide board transfer EOB<>BSC with MIN-MOD A overall and VC for head hips relationship/hand placement. Pt completes lateral leans for OT to remove brief and after B&B void for posterior cleansing. Pt requires increased time for all movement d/t pain, however encouraged by progress. Pt requires total A overall for toileting. Educated pt and husband on use of BSC at EOB for better leaning into bed and w/c and wearing dresses for easier clothing management when at home. Exited session with pt seated in bed, call light in reach and all needs met.  Therapy Documentation Precautions:  Precautions Precautions: Fall, Other (comment) Precaution Comments: R AKA, L foot wounds Restrictions Weight Bearing Restrictions: Yes RLE Weight Bearing: Non weight bearing Other Position/Activity Restrictions: caution about trying to put weight through left LE due to wounds on forefoot Pain: Pain Assessment Pain Scale: 0-10 Pain Score: 8  Pain Type: Acute pain Pain Location: Toe (Comment which one) Pain Orientation: Right;Left Pain Descriptors / Indicators: Aching Pain Frequency: Constant Pain Onset:  On-going Patients Stated Pain Goal: 2 Pain Intervention(s): Medication (See eMAR) ADL: Other Treatments:    See Function Navigator for Current Functional Status.   Therapy/Group: Individual Therapy  Tonny Branch 06/13/2018, 4:57 PM

## 2018-06-13 NOTE — Progress Notes (Signed)
[place] PHYSICAL MEDICINE & REHABILITATION     PROGRESS NOTE  Subjective/Complaints:  Patient seen lying in bed this AM.  She slept well overnight.  She states she is having some difficulty trying to go to the bathroom.   ROS: Denies CP, SOB, N/V/D.  Objective: Vital Signs: Blood pressure (!) 125/47, pulse 82, temperature 98.6 F (37 C), temperature source Oral, resp. rate 16, height 5\' 4"  (1.626 m), weight 62.2 kg, SpO2 99 %. No results found. Recent Labs    06/12/18 1859  WBC 10.9*  HGB 8.6*  HCT 30.2*  PLT 269   Recent Labs    06/12/18 1900  NA 131*  K 4.3  CL 95*  GLUCOSE 163*  BUN 53*  CREATININE 4.82*  CALCIUM 8.0*   CBG (last 3)  Recent Labs    06/12/18 2319 06/13/18 0632 06/13/18 1152  GLUCAP 153* 138* 187*    Wt Readings from Last 3 Encounters:  06/13/18 62.2 kg  06/10/18 65.4 kg  06/04/18 79.4 kg    Physical Exam:  BP (!) 125/47 (BP Location: Right Arm)   Pulse 82   Temp 98.6 F (37 C) (Oral)   Resp 16   Ht 5\' 4"  (1.626 m)   Wt 62.2 kg   SpO2 99%   BMI 23.54 kg/m  Constitutional: She has asickly appearance. NAD. HENT: normocephalic. Atraumatic Eyes: disconjugate gaze Cardiac: RRR. No JVD. Lungs: Clear. Unlabored. Abdomen: Positive bowel sounds, nondistended Musculoskeletal: Right AKA with edema Neurologic:  alert. Motor:  4/5 left hip flexors, knee extensors, ankle dorsiflexor, stable  4+/5 right hip flexion Skin: Right AKA with dressing C/D/I Left lower extremity dressing c/d/i  Assessment/Plan: 1. Functional deficits secondary to right AKA with left lower extremity dry gangrene which require 3+ hours per day of interdisciplinary therapy in a comprehensive inpatient rehab setting. Physiatrist is providing close team supervision and 24 hour management of active medical problems listed below. Physiatrist and rehab team continue to assess barriers to discharge/monitor patient progress toward functional and medical  goals.  Function:  Bathing Bathing position   Position: Sitting EOB  Bathing parts Body parts bathed by patient: Right arm, Left arm, Chest, Abdomen Body parts bathed by helper: Front perineal area, Buttocks, Right upper leg, Left upper leg, Left lower leg, Back  Bathing assist Assist Level: Set up, Touching or steadying assistance(Pt > 75%)   Set up : To obtain items  Upper Body Dressing/Undressing Upper body dressing   What is the patient wearing?: Pull over shirt/dress     Pull over shirt/dress - Perfomed by patient: Thread/unthread right sleeve, Thread/unthread left sleeve, Put head through opening, Pull shirt over trunk          Upper body assist Assist Level: Supervision or verbal cues      Lower Body Dressing/Undressing Lower body dressing   What is the patient wearing?: Pants       Pants- Performed by helper: Thread/unthread right pants leg, Thread/unthread left pants leg, Pull pants up/down                      Lower body assist Assist for lower body dressing: Touching or steadying assistance (Pt > 75%)      Toileting Toileting     Toileting steps completed by helper: Performs perineal hygiene    Toileting assist Assist level: Touching or steadying assistance (Pt.75%)   Transfers Chair/bed transfer   Chair/bed transfer method: Lateral scoot Chair/bed transfer assist level: Touching or steadying  assistance (Pt > 75%) Chair/bed transfer assistive device: Sliding board, Armrests     Locomotion Ambulation Ambulation activity did not occur: Safety/medical concerns         Wheelchair   Type: Manual Max wheelchair distance: 100 Assist Level: Supervision or verbal cues  Cognition Comprehension Comprehension assist level: Understands basic 75 - 89% of the time/ requires cueing 10 - 24% of the time  Expression Expression assist level: Expresses basic 75 - 89% of the time/requires cueing 10 - 24% of the time. Needs helper to occlude trach/needs to  repeat words.  Social Interaction Social Interaction assist level: Interacts appropriately with others with medication or extra time (anti-anxiety, antidepressant).  Problem Solving Problem solving assist level: Solves basic 90% of the time/requires cueing < 10% of the time  Memory Memory assist level: Recognizes or recalls 90% of the time/requires cueing < 10% of the time    Medical Problem List and Plan: 1. Decline in functional mobility and ADLs  secondary to Right Above knee amputation  Cont CIR 2.H/oDVT/ A fib/anticoagulation:Pharmaceutical:Other (comment)--Eliquis. 3.ChronicPain Management:On fentanyl 50 mcg (100 mcg PTA?) with prn oxycodone.  4. Mood:LCSW to follow for evaluation and support. Seems to be accepting of thedecision forR-AKA.  5. Neuropsych: This patientis not fullycapable of making decisions onherown behalf. 6. Skin/Wound Care:Continue nephro/prostat supplements to promote healing. Monitor wounds for healing.   Patient will likely require left lower extremity amputation future 7. Fluids/Electrolytes/Nutrition:Daily weights with strict I/O. Labs with HD. 8. T2DM with neuropathy and retionpathy:   Was on levemir 10 units daily--resumed 5 units daily, increased to 10 units on 9/16   Monitor BS ac/hs. Continue SSI for tighter control.   Slightly elevated on 9/14 9. PVD with left foot wound: Question due to calciphylaxis v/s pyoderma gangrenosum.  10. A fib/Aflutter:Monitor heart rate twice daily. On Eliquis and beta-blocker twice daily 11. SLE: Continue plaquenil and low dose prednisone.  12. ESRD: Schedule HD on TTS at end of day to help with tolerance of therapy.  13. Anemia of chronic disease:Arransep weekly started on 9/12.   Hemoglobin 8.6 on 9/14  Continue to monitor 14. H/o Glaucoma: Stable on Timpotic and Xalatan.  15. H/o gastritis/erosive gastropathy: Per EGD 6/26. Continue PPI. Serial H/H monitored with HD. 16. Elevated blood  pressure  Controlled on 9/15 17. Leukocytosis  WBCs 10.9 on 9/14  Afebrile  Cont to monitor  LOS (Days) 3 A FACE TO FACE EVALUATION WAS PERFORMED  Shatonya Passon Lorie Phenix 06/13/2018 2:14 PM

## 2018-06-13 NOTE — Progress Notes (Signed)
Subjective: Interval History: has no complaint, rehab going well..  Objective: Vital signs in last 24 hours: Temp:  [97.7 F (36.5 C)-98.7 F (37.1 C)] 98.7 F (37.1 C) (09/15 0422) Pulse Rate:  [67-99] 85 (09/15 0422) Resp:  [18-20] 18 (09/15 0422) BP: (85-164)/(37-77) 126/59 (09/15 0422) SpO2:  [97 %-100 %] 100 % (09/15 0422) Weight:  [62.2 kg-63.9 kg] 62.2 kg (09/15 0600) Weight change: 0.7 kg  Intake/Output from previous day: 09/14 0701 - 09/15 0700 In: 362 [P.O.:362] Out: 1819 [Urine:100] Intake/Output this shift: No intake/output data recorded.  General appearance: alert, cooperative, no distress and mildly obese Resp: clear to auscultation bilaterally Chest wall: RIJ cath Cardio: S1, S2 normal and systolic murmur: systolic ejection 2/6, crescendo and decrescendo at 2nd left intercostal space GI: soft, pos bs, liver down 4  cm Extremities: R BKA, dressing L foot, AVF LUA  Lab Results: Recent Labs    06/12/18 1859  WBC 10.9*  HGB 8.6*  HCT 30.2*  PLT 269   BMET:  Recent Labs    06/12/18 1900  NA 131*  K 4.3  CL 95*  CO2 24  GLUCOSE 163*  BUN 53*  CREATININE 4.82*  CALCIUM 8.0*   No results for input(s): PTH in the last 72 hours. Iron Studies:  Recent Labs    06/12/18 1859  IRON 30  TIBC 132*  FERRITIN 1,227*    Studies/Results: No results found.  I have reviewed the patient's current medications.  Assessment/Plan: 1 ESRD HD yest. Stable 2 Anemia stable 3 HPTH meds 4 DM controlled 5 PVD per VVS 6 [place] per rehab P HD TTS, control bs, rehab    LOS: 3 days   Jeneen Rinks Lucious Zou 06/13/2018,8:48 AM

## 2018-06-14 ENCOUNTER — Inpatient Hospital Stay (HOSPITAL_COMMUNITY): Payer: Medicare Other | Admitting: Physical Therapy

## 2018-06-14 ENCOUNTER — Inpatient Hospital Stay (HOSPITAL_COMMUNITY): Payer: Medicare Other | Admitting: Occupational Therapy

## 2018-06-14 DIAGNOSIS — L97521 Non-pressure chronic ulcer of other part of left foot limited to breakdown of skin: Secondary | ICD-10-CM | POA: Diagnosis not present

## 2018-06-14 DIAGNOSIS — E1122 Type 2 diabetes mellitus with diabetic chronic kidney disease: Secondary | ICD-10-CM | POA: Diagnosis not present

## 2018-06-14 DIAGNOSIS — I4891 Unspecified atrial fibrillation: Secondary | ICD-10-CM | POA: Diagnosis not present

## 2018-06-14 DIAGNOSIS — T380X5A Adverse effect of glucocorticoids and synthetic analogues, initial encounter: Secondary | ICD-10-CM

## 2018-06-14 DIAGNOSIS — E11622 Type 2 diabetes mellitus with other skin ulcer: Secondary | ICD-10-CM | POA: Diagnosis not present

## 2018-06-14 DIAGNOSIS — N186 End stage renal disease: Secondary | ICD-10-CM | POA: Diagnosis not present

## 2018-06-14 DIAGNOSIS — I872 Venous insufficiency (chronic) (peripheral): Secondary | ICD-10-CM | POA: Diagnosis not present

## 2018-06-14 DIAGNOSIS — J449 Chronic obstructive pulmonary disease, unspecified: Secondary | ICD-10-CM | POA: Diagnosis not present

## 2018-06-14 DIAGNOSIS — R739 Hyperglycemia, unspecified: Secondary | ICD-10-CM

## 2018-06-14 LAB — GLUCOSE, CAPILLARY
Glucose-Capillary: 169 mg/dL — ABNORMAL HIGH (ref 70–99)
Glucose-Capillary: 137 mg/dL — ABNORMAL HIGH (ref 70–99)
Glucose-Capillary: 76 mg/dL (ref 70–99)
Glucose-Capillary: 104 mg/dL — ABNORMAL HIGH (ref 70–99)

## 2018-06-14 MED ORDER — CHLORHEXIDINE GLUCONATE CLOTH 2 % EX PADS: 6.0000 | MEDICATED_PAD | Freq: Every day | CUTANEOUS | Status: DC

## 2018-06-14 NOTE — Progress Notes (Addendum)
[facility] Progress Note   Subjective:  Seen during PT session; doing well. C/o mild leg pain, but says it is tolerable. No CP or dyspnea.  Objective Vitals:   06/14/18 0449 06/14/18 0453 06/14/18 0958 06/14/18 1327  BP: (!) 180/45 (!) 102/52 (!) 129/40 (!) 134/41  Pulse: 83 74 71 75  Resp: 18   16  Temp: 97.9 F (36.6 C)   (!) 97.5 F (36.4 C)  TempSrc: Oral   Oral  SpO2: 99%   100%  Weight:  62.1 kg    Height:       Physical Exam General: Frail woman, NAD Heart: RRR; 2/6 systolic murmur Lungs: CTAB Extremities: No LLE edema (foot bandaged); R AKA with bandaged stump Dialysis Access: AVG + [facility]  Additional Objective Labs: Basic Metabolic Panel: Recent Labs  Lab 06/09/18 0344 06/10/18 0409 06/12/18 1900  NA 136 135 131*  K 3.9 3.7 4.3  CL 98 100 95*  CO2 25 24 24   GLUCOSE 191* 174* 163*  BUN 30* 47* 53*  CREATININE 3.00* 4.38* 4.82*  CALCIUM 7.7* 7.8* 8.0*  PHOS  --   --  4.3   Liver Function Tests: Recent Labs  Lab 06/08/18 0752 06/12/18 1900  AST 18  --   ALT 13  --   ALKPHOS 83  --   BILITOT 0.4  --   PROT 6.6  --   ALBUMIN 1.8* 1.7*   CBC: Recent Labs  Lab 06/08/18 0752 06/09/18 0344 06/10/18 0409 06/12/18 1859  WBC 13.1* 12.0* 9.4 10.9*  HGB 8.3* 7.7* 8.3* 8.6*  HCT 29.0* 26.8* 29.4* 30.2*  MCV 92.9 92.1 95.1 94.1  PLT 326 276 252 269   CBG: Recent Labs  Lab 06/13/18 1152 06/13/18 1638 06/13/18 2111 06/14/18 0637 06/14/18 1140  GLUCAP 187* 153* 140* 169* 137*   Iron Studies:  Recent Labs    06/12/18 1859  IRON 30  TIBC 132*  FERRITIN 1,227*   Medications:  . allopurinol  100 mg Oral Q M,W,F  . apixaban  5 mg Oral BID  . atorvastatin  20 mg Oral QHS  . brimonidine  1 drop Left Eye BID   And  . timolol  1 drop Left Eye BID  . Chlorhexidine Gluconate Cloth  6 each Topical Q0600  . [START ON 06/15/2018] Chlorhexidine Gluconate Cloth  6 each Topical Q0600  . [START ON 06/17/2018] darbepoetin (ARANESP)  injection - DIALYSIS  200 mcg Intravenous Q Thu-HD  . dicyclomine  10 mg Oral TID AC & HS  . docusate sodium  100 mg Oral Daily  . feeding supplement (NEPRO CARB STEADY)  237 mL Oral TID WC  . feeding supplement (PRO-STAT SUGAR FREE 64)  30 mL Oral BID  . fentaNYL  50 mcg Transdermal Q72H  . hydroxychloroquine  200 mg Oral Daily  . insulin aspart  0-15 Units Subcutaneous TID WC  . insulin detemir  10 Units Subcutaneous Daily  . latanoprost  1 drop Both Eyes QHS  . metoprolol tartrate  12.5 mg Oral 2 times per day on Sun Mon Wed Fri  . multivitamin  1 tablet Oral QHS  . pantoprazole  40 mg Oral Daily  . predniSONE  5 mg Oral Q breakfast    Dialysis Orders: TTS at [facility] 3:30hr, 400/800, EDW 69.5kg, 2K/2.5Ca, AVF, heparin 2000 bolus - Venofer 50mg  IV weekly - Mircera 270mcg IV q 2 weeks (last given 9/5 - Hgb 7.4 at that time)  Assessment/Plan: 1. S/p R AKA  9/10 for severe RLE wounds: In CIR - doing well with PT. 2.  ESRD:Continue TTS schedule, next 9/17. Tight heparin. 3. Hypertension/volume:BPcontrolled, EDW will be lowered on d/c. 4. Anemia:Hgb 8.6, continue Aranesp 223mcg q Thursday. 5. Metabolic bone disease:Labs ok. No VDRA. 6. Nutrition:Alb very low,continue Nepro and Pro-stat supps. 7. Type 2 DM: Per primary. 8. PVD, L foot wound: Per primary (?calciphylaxis).  Veneta Penton, PA-C 06/14/2018, 3:36 PM  [facility] Pager: [phone]  Pt seen, examined and agree w A/P as above.  Kelly Splinter MD [employer] pager [phone]   06/14/2018, 4:28 PM

## 2018-06-14 NOTE — Progress Notes (Signed)
Physical Therapy Session Note  Patient Details  Name: [name] MRN: [id number] Date of Birth: March 12, 1946  Today's Date: 06/14/2018 PT Individual Time: 0815-0915 PT Individual Time Calculation (min): 60 min   Short Term Goals: Week 1:  PT Short Term Goal 1 (Week 1): Pt will perform bed mobility with Supervision PT Short Term Goal 2 (Week 1): Pt will perform least restrictive transfer with min A PT Short Term Goal 3 (Week 1): Pt will propel w/c x 100 ft with Supervision  Skilled Therapeutic Interventions/Progress Updates:    Patient initially refused PT due to having not had breakfast; 15 minutes of skilled therapy time missed due to patient eating meal. Returned to room and she required high levels of encouragement to participate, stating "you'd better just come back because the nurse techs need to get me dressed and its going to take awhile"; able to eventually convince patient to participate in therapy by incorporating dressing/bathing tasks into session. She was able to roll back and forth in bed for application of brief and pants with S, and required MinA for supine to sit at EOB. TotalA for applying bra but able to put on shirt with no assistance today. Performed sliding board transfer with [place] and Mod cues, she was then able to self-propel her WC distances of 68f before becoming fatigued and required multiple rest breaks to advanced chair to PT gym. Otherwise worked on B UE strengthening and endurance to assist in improving transfers and WC endurance. She was left up in her chair with all needs met this morning.   Therapy Documentation Precautions:  Precautions Precautions: Fall, Other (comment) Precaution Comments: R AKA, L foot wounds Restrictions Weight Bearing Restrictions: Yes RLE Weight Bearing: Non weight bearing Other Position/Activity Restrictions: caution about trying to put weight through left LE due to wounds on forefoot General: PT Amount of Missed Time (min):  15 Minutes PT Missed Treatment Reason: Patient unwilling to participate Pain: Pain Assessment Pain Scale: 0-10 Pain Score: 0-No pain Faces Pain Scale: No hurt   See Function Navigator for Current Functional Status.   Therapy/Group: Individual Therapy  KDeniece ReePT, DPT, CBIS  Supplemental Physical Therapist [facility]   Pager [phone] Rehab Office [phone]  06/14/2018, 12:12 PM

## 2018-06-14 NOTE — Progress Notes (Signed)
[place] PHYSICAL MEDICINE & REHABILITATION     PROGRESS NOTE  Subjective/Complaints:  Patient seen sitting up in bed this morning. She states she slept fairly overnight, confirms sleep chart. She asked me what I think will help control her left foot. She appears surprised and taken aback with the suggestion of amputation at some point.  ROS: + leg pain. Denies CP, SOB, N/V/D.  Objective: Vital Signs: Blood pressure (!) 102/52, pulse 74, temperature 97.9 F (36.6 C), temperature source Oral, resp. rate 18, height 5\' 4"  (1.626 m), weight 62.1 kg, SpO2 99 %. No results found. Recent Labs    06/12/18 1859  WBC 10.9*  HGB 8.6*  HCT 30.2*  PLT 269   Recent Labs    06/12/18 1900  NA 131*  K 4.3  CL 95*  GLUCOSE 163*  BUN 53*  CREATININE 4.82*  CALCIUM 8.0*   CBG (last 3)  Recent Labs    06/13/18 1638 06/13/18 2111 06/14/18 0637  GLUCAP 153* 140* 169*    Wt Readings from Last 3 Encounters:  06/14/18 62.1 kg  06/10/18 65.4 kg  06/04/18 79.4 kg    Physical Exam:  BP (!) 102/52 (BP Location: Left Leg)   Pulse 74   Temp 97.9 F (36.6 C) (Oral)   Resp 18   Ht 5\' 4"  (1.626 m)   Wt 62.1 kg   SpO2 99%   BMI 23.50 kg/m  Constitutional: She has sickly appearance. NAD. HENT: normocephalic. Atraumatic Eyes: disconjugate gaze Cardiac: RRR. No JVD. Lungs: clear. Unlabored. Abdomen: Positive bowel sounds, nondistended Musculoskeletal: Right AKA with edema Neurologic: Alert. Motor:  4/5 left hip flexors, knee extensors, ankle dorsiflexor, unchanged 4+/5 right hip flexion, unchanged Skin: Right AKA with dressing C/D/I Left lower extremity dressing c/d/i  Assessment/Plan: 1. Functional deficits secondary to right AKA with left lower extremity dry gangrene which require 3+ hours per day of interdisciplinary therapy in a comprehensive inpatient rehab setting. Physiatrist is providing close team supervision and 24 hour management of active medical problems listed  below. Physiatrist and rehab team continue to assess barriers to discharge/monitor patient progress toward functional and medical goals.  Function:  Bathing Bathing position   Position: Sitting EOB  Bathing parts Body parts bathed by patient: Right arm, Left arm, Chest, Abdomen Body parts bathed by helper: Front perineal area, Buttocks, Right upper leg, Left upper leg, Left lower leg, Back  Bathing assist Assist Level: Set up, Touching or steadying assistance(Pt > 75%)   Set up : To obtain items  Upper Body Dressing/Undressing Upper body dressing   What is the patient wearing?: Pull over shirt/dress     Pull over shirt/dress - Perfomed by patient: Thread/unthread right sleeve, Thread/unthread left sleeve, Put head through opening, Pull shirt over trunk          Upper body assist Assist Level: Supervision or verbal cues      Lower Body Dressing/Undressing Lower body dressing   What is the patient wearing?: Pants       Pants- Performed by helper: Thread/unthread right pants leg, Thread/unthread left pants leg, Pull pants up/down                      Lower body assist Assist for lower body dressing: Touching or steadying assistance (Pt > 75%)      Toileting Toileting     Toileting steps completed by helper: Performs perineal hygiene    Toileting assist Assist level: Touching or steadying assistance (Pt.75%)  Transfers Chair/bed transfer   Chair/bed transfer method: Lateral scoot Chair/bed transfer assist level: Touching or steadying assistance (Pt > 75%) Chair/bed transfer assistive device: Sliding board, Armrests     Locomotion Ambulation Ambulation activity did not occur: Safety/medical concerns         Wheelchair   Type: Manual Max wheelchair distance: 100 Assist Level: Supervision or verbal cues  Cognition Comprehension Comprehension assist level: Understands basic 75 - 89% of the time/ requires cueing 10 - 24% of the time  Expression  Expression assist level: Expresses basic 75 - 89% of the time/requires cueing 10 - 24% of the time. Needs helper to occlude trach/needs to repeat words.  Social Interaction Social Interaction assist level: Interacts appropriately with others with medication or extra time (anti-anxiety, antidepressant).  Problem Solving Problem solving assist level: Solves basic 90% of the time/requires cueing < 10% of the time  Memory Memory assist level: Recognizes or recalls 90% of the time/requires cueing < 10% of the time    Medical Problem List and Plan: 1. Decline in functional mobility and ADLs  secondary to Right Above knee amputation  Cont CIR 2.H/oDVT/ A fib/anticoagulation:Pharmaceutical:Other (comment)--Eliquis. 3.ChronicPain Management:On fentanyl50 mcg (100 mcg PTA?) with prn oxycodone.  4. Mood:LCSW to follow for evaluation and support. Seems to be accepting of thedecision forR-AKA.  5. Neuropsych: This patientis not fullycapable of making decisions onherown behalf. 6. Skin/Wound Care:Continue nephro/prostat supplements to promote healing. Monitor wounds for healing.   Patient will likely require left lower extremity amputation future 7. Fluids/Electrolytes/Nutrition:Daily weights with strict I/O. Labs with HD. 8. T2DM with neuropathy and retionpathy, coundounded by steroids:   Was on levemir 10 units daily--resumed 5 units daily, increased to 10 units on 9/16  Monitor BS ac/hs. Continue SSI for tighter control.  9. PVD with left foot wound: Question due to calciphylaxis v/s pyoderma gangrenosum.  10. A fib/Aflutter:Monitor heart rate twice daily. On Eliquis and beta-blocker twice daily 11. SLE: Continue plaquenil and low dose prednisone.  12. ESRD: Schedule HD on TTS at end of day to help with tolerance of therapy.  13. Anemia of chronic disease:Arransep weekly started on 9/12.   Hemoglobin 8.6 on 9/14  Labs with HD  Continue to monitor 14. H/o Glaucoma: Stable on  Timpotic and Xalatan.  15. H/o gastritis/erosive gastropathy: Per EGD 6/26. Continue PPI. Serial H/H monitored with HD. 16. Elevated blood pressure  Labile on 9/16 17. Leukocytosis  WBCs 10.9 on 9/14  Afebrile  Cont to monitor  LOS (Days) 4 A FACE TO FACE EVALUATION WAS PERFORMED  Mylasia Vorhees Lorie Phenix 06/14/2018 8:07 AM

## 2018-06-14 NOTE — Progress Notes (Signed)
Occupational Therapy Note  Patient Details  Name: [name] MRN: [id number] Date of Birth: [birth year]  Pt's plan of care adjusted to 15/7 after speaking with care team and discussed with PA  as pt currently unable to tolerate current therapy schedule with OT,and PT.   Gypsy Decant 06/14/2018, 3:55 PM

## 2018-06-14 NOTE — Progress Notes (Signed)
Occupational Therapy Session Note  Patient Details  Name: ARIANY KESSELMAN MRN: [id number] Date of Birth: [birth year]  Today's Date: 06/14/2018 OT Individual Time: 1500-1540 OT Individual Time Calculation (min): 40 min  20 missed minutes secondary to pt refusal  Short Term Goals: Week 1:  OT Short Term Goal 1 (Week 1): Pt will perform transfer to wide drop arm BSC with mod A  OT Short Term Goal 2 (Week 1): Pt will perform lateral leans with min A while pulling up pants OT Short Term Goal 3 (Week 1): Pt will don pants with mod A (2/3 steps) EOB OT Short Term Goal 4 (Week 1): Pt will setup w/c in prep for transfers with supervision with mod cues  Skilled Therapeutic Interventions/Progress Updates:    Upon entering the room, pt seated in wheelchair with husband present. Pt immediately refusing OT intervention and needing maximal encouragement for participation. Pt agreeable to B UE strengthening  with use of orange level 2 theraband. Pt performed 1 set of 10 chest pulls, shoulder diagonals, shoulder flexion, and alternating punches. [name] stating, " They said they gonna have to cut off her other leg now." OT notified PA of concerns and family wishing to speak to MD regarding this topic. Pt reports feeling fatigued from prior therapy session and discussed possible 15/7 schedule change to increase participation with scheduled therapy sessions. Pt remained seated in wheelchair with family present in room. Call bell and all needed items within reach.   Therapy Documentation Precautions:  Precautions Precautions: Fall, Other (comment) Precaution Comments: R AKA, L foot wounds Restrictions Weight Bearing Restrictions: Yes RLE Weight Bearing: Non weight bearing Other Position/Activity Restrictions: caution about trying to put weight through left LE due to wounds on forefoot General: General OT Amount of Missed Time: 20 Minutes PT Missed Treatment Reason: Patient unwilling to  participate Vital Signs: Therapy Vitals Temp: (!) 97.5 F (36.4 C) Temp Source: Oral Pulse Rate: 75 Resp: 16 BP: (!) 134/41 Patient Position (if appropriate): Lying Oxygen Therapy SpO2: 100 % O2 Device: Room Air Pain: Pain Assessment Pain Scale: 0-10 Pain Score: 2  Faces Pain Scale: No hurt Pain Type: Surgical pain Pain Location: Leg Pain Orientation: Right Pain Descriptors / Indicators: Aching Pain Frequency: Intermittent Pain Onset: On-going Patients Stated Pain Goal: 2 Pain Intervention(s): Medication (See eMAR);Repositioned Multiple Pain Sites: No ADL: ADL Eating: Set up Where Assessed-Eating: Wheelchair Grooming: Setup Where Assessed-Grooming: Sitting at sink Upper Body Bathing: Contact guard Where Assessed-Upper Body Bathing: Edge of bed Lower Body Bathing: Maximal assistance Upper Body Dressing: Setup Where Assessed-Upper Body Dressing: Edge of bed Lower Body Dressing: Maximal assistance Where Assessed-Lower Body Dressing: Edge of bed Toileting: Maximal assistance Where Assessed-Toileting: Bed level  See Function Navigator for Current Functional Status.   Therapy/Group: Individual Therapy  Gypsy Decant 06/14/2018, 4:01 PM

## 2018-06-14 NOTE — Progress Notes (Signed)
Physical Therapy Session Note  Patient Details  Name: [name] MRN: [id number] Date of Birth: 03-31-1946  Today's Date: 06/14/2018 PT Individual Time: 1400-1500 PT Individual Time Calculation (min): 60 min   Short Term Goals: Week 1:  PT Short Term Goal 1 (Week 1): Pt will perform bed mobility with Supervision PT Short Term Goal 2 (Week 1): Pt will perform least restrictive transfer with min A PT Short Term Goal 3 (Week 1): Pt will propel w/c x 100 ft with Supervision  Skilled Therapeutic Interventions/Progress Updates: Pt received seated in bed, c/o pain as below and agreeable to treatment. R residual limb re-wrapped d/t dressing falling off limb, not providing any edema control. Educated pt regarding role of compression, edema management. Pt reports medical staff "has mentioned" prosthetic but does not know much about it. Supine>sit minA with HOB elevated. Transfer bed>w/c with transfer board and min guard, increased time. Per husband, pt sleeps in recliner as w/c unable to fit into bedroom. Performed transfer w/c >recliner with min guard, requires min/modA +2 for uphill transfer out of recliner. Transfer w/c <>mat table with min guard, increased time. Sit>supine with S. Supine straight led raise x10 reps BLE, sidelying RLE hip abduction x10 reps AAROM d/t muscle spasms and pain. Returned to sitting modA from supine. Pt agreeable to remain in w/c at end of session, son and husband present, all needs in reach. Pt verbalizes understanding to call for staff when ready to return to bed.      Therapy Documentation Precautions:  Precautions Precautions: Fall, Other (comment) Precaution Comments: R AKA, L foot wounds Restrictions Weight Bearing Restrictions: Yes RLE Weight Bearing: Non weight bearing Other Position/Activity Restrictions: caution about trying to put weight through left LE due to wounds on forefoot Pain: Pain Assessment Pain Scale: 0-10 Pain Score: 2  Faces Pain  Scale: No hurt Pain Type: Surgical pain Pain Location: Leg Pain Orientation: Right Pain Descriptors / Indicators: Aching Pain Frequency: Intermittent Pain Onset: On-going Patients Stated Pain Goal: 2 Pain Intervention(s): Medication (See eMAR);Repositioned Multiple Pain Sites: No   See Function Navigator for Current Functional Status.   Therapy/Group: Individual Therapy  Corliss Skains 06/14/2018, 3:20 PM

## 2018-06-14 NOTE — Progress Notes (Signed)
Patient information reviewed and entered into e[facility] system by Melenda Bielak, RN, CRRN, PPS Coordinator.  Information including medical coding and functional independence measure will be reviewed and updated through discharge.     Per nursing patient was given "Data Collection Information Summary for Patients in Inpatient Rehabilitation Facilities with attached "Privacy Act Statement-Health Care Records" upon admission.  

## 2018-06-15 ENCOUNTER — Inpatient Hospital Stay (HOSPITAL_COMMUNITY): Payer: Medicare Other | Admitting: Occupational Therapy

## 2018-06-15 ENCOUNTER — Inpatient Hospital Stay (HOSPITAL_COMMUNITY): Payer: Medicare Other | Admitting: Physical Therapy

## 2018-06-15 LAB — GLUCOSE, CAPILLARY
GLUCOSE-CAPILLARY: 155 mg/dL — AB (ref 70–99)
GLUCOSE-CAPILLARY: 153 mg/dL — AB (ref 70–99)
GLUCOSE-CAPILLARY: 91 mg/dL (ref 70–99)
Glucose-Capillary: 92 mg/dL (ref 70–99)
Glucose-Capillary: 165 mg/dL — ABNORMAL HIGH (ref 70–99)

## 2018-06-15 LAB — CBC
HCT: 29.7 % — ABNORMAL LOW (ref 36.0–46.0)
Hemoglobin: 8.5 g/dL — ABNORMAL LOW (ref 12.0–15.0)
MCH: 27.2 pg (ref 26.0–34.0)
MCHC: 28.6 g/dL — ABNORMAL LOW (ref 30.0–36.0)
MCV: 94.9 fL (ref 78.0–100.0)
Platelets: 269 K/uL (ref 150–400)
RBC: 3.13 MIL/uL — ABNORMAL LOW (ref 3.87–5.11)
RDW: 17.8 % — ABNORMAL HIGH (ref 11.5–15.5)
WBC: 9.6 K/uL (ref 4.0–10.5)

## 2018-06-15 LAB — RENAL FUNCTION PANEL
Albumin: 1.7 g/dL — ABNORMAL LOW (ref 3.5–5.0)
Anion gap: 12 (ref 5–15)
BUN: 68 mg/dL — ABNORMAL HIGH (ref 8–23)
CO2: 26 mmol/L (ref 22–32)
Calcium: 8.1 mg/dL — ABNORMAL LOW (ref 8.9–10.3)
Chloride: 95 mmol/L — ABNORMAL LOW (ref 98–111)
Creatinine, Ser: 5.28 mg/dL — ABNORMAL HIGH (ref 0.44–1.00)
GFR calc Af Amer: 9 mL/min — ABNORMAL LOW
GFR calc non Af Amer: 7 mL/min — ABNORMAL LOW
Glucose, Bld: 143 mg/dL — ABNORMAL HIGH (ref 70–99)
Phosphorus: 4.3 mg/dL (ref 2.5–4.6)
Potassium: 3.7 mmol/L (ref 3.5–5.1)
Sodium: 133 mmol/L — ABNORMAL LOW (ref 135–145)

## 2018-06-15 MED ORDER — HEPARIN SODIUM (PORCINE) 1000 UNIT/ML DIALYSIS: 20.0000 [IU]/kg | INTRAMUSCULAR | Status: DC | PRN

## 2018-06-15 NOTE — Progress Notes (Signed)
PHYSICAL MEDICINE & REHABILITATION     PROGRESS NOTE  Subjective/Complaints:  Patient seen sitting up in bed this morning.  She states she slept well overnight, confirmed with sleep chart.  She has questions again about amputation and states she needs to speak with her husband.  ROS: Denies CP, SOB, N/V/D.  Objective: Vital Signs: Blood pressure (!) 134/36, pulse 86, temperature 98.7 F (37.1 C), temperature source Oral, resp. rate 18, height 5\' 4"  (1.626 m), weight 62.2 kg, SpO2 100 %. No results found. Recent Labs    06/12/18 1859  WBC 10.9*  HGB 8.6*  HCT 30.2*  PLT 269   Recent Labs    06/12/18 1900  NA 131*  K 4.3  CL 95*  GLUCOSE 163*  BUN 53*  CREATININE 4.82*  CALCIUM 8.0*   CBG (last 3)  Recent Labs    06/14/18 1654 06/14/18 2108 06/15/18 0627  GLUCAP 76 104* 155*    Wt Readings from Last 3 Encounters:  06/15/18 62.2 kg  06/10/18 65.4 kg  06/04/18 79.4 kg    Physical Exam:  BP (!) 134/36 (BP Location: Right Arm)   Pulse 86   Temp 98.7 F (37.1 C) (Oral)   Resp 18   Ht 5\' 4"  (1.626 m)   Wt 62.2 kg   SpO2 100%   BMI 23.54 kg/m  Constitutional: She has sickly appearance. NAD. HENT: normocephalic. Atraumatic Eyes: disconjugate gaze Cardiac: RRR.  No JVD. Lungs: Clear.  Unlabored. Abdomen: Positive bowel sounds, nondistended Musculoskeletal: Right AKA with edema Neurologic: Alert. Motor:  4/5 left hip flexors, knee extensors, ankle dorsiflexor, stable 4+/5 right hip flexion, stable Skin: Right AKA with dressing C/D/I Left lower extremity dressing c/d/i  Assessment/Plan: 1. Functional deficits secondary to right AKA with left lower extremity dry gangrene which require 3+ hours per day of interdisciplinary therapy in a comprehensive inpatient rehab setting. Physiatrist is providing close team supervision and 24 hour management of active medical problems listed below. Physiatrist and rehab team continue to assess barriers to  discharge/monitor patient progress toward functional and medical goals.  Function:  Bathing Bathing position   Position: Sitting EOB  Bathing parts Body parts bathed by patient: Right arm, Left arm, Chest, Abdomen Body parts bathed by helper: Front perineal area, Buttocks, Right upper leg, Left upper leg, Left lower leg, Back  Bathing assist Assist Level: Set up, Touching or steadying assistance(Pt > 75%)   Set up : To obtain items  Upper Body Dressing/Undressing Upper body dressing   What is the patient wearing?: Pull over shirt/dress     Pull over shirt/dress - Perfomed by patient: Thread/unthread right sleeve, Thread/unthread left sleeve, Put head through opening, Pull shirt over trunk          Upper body assist Assist Level: Supervision or verbal cues      Lower Body Dressing/Undressing Lower body dressing   What is the patient wearing?: Pants       Pants- Performed by helper: Thread/unthread right pants leg, Thread/unthread left pants leg, Pull pants up/down                      Lower body assist Assist for lower body dressing: Touching or steadying assistance (Pt > 75%)      Toileting Toileting     Toileting steps completed by helper: Performs perineal hygiene    Toileting assist Assist level: Touching or steadying assistance (Pt.75%)   Transfers Chair/bed transfer   Chair/bed transfer method:  Lateral scoot Chair/bed transfer assist level: Touching or steadying assistance (Pt > 75%) Chair/bed transfer assistive device: Armrests, Sliding board     Locomotion Ambulation Ambulation activity did not occur: Safety/medical concerns         Wheelchair   Type: Manual Max wheelchair distance: 50 Assist Level: Supervision or verbal cues  Cognition Comprehension Comprehension assist level: Understands basic 75 - 89% of the time/ requires cueing 10 - 24% of the time  Expression Expression assist level: Expresses basic 75 - 89% of the time/requires  cueing 10 - 24% of the time. Needs helper to occlude trach/needs to repeat words.  Social Interaction Social Interaction assist level: Interacts appropriately 90% of the time - Needs monitoring or encouragement for participation or interaction.  Problem Solving Problem solving assist level: Solves basic 90% of the time/requires cueing < 10% of the time  Memory Memory assist level: Recognizes or recalls 90% of the time/requires cueing < 10% of the time    Medical Problem List and Plan: 1. Decline in functional mobility and ADLs  secondary to Right Above knee amputation  Cont CIR 2.H/oDVT/ A fib/anticoagulation:Pharmaceutical:Other (comment)--Eliquis. 3.ChronicPain Management:On fentanyl 50 mcg (100 mcg PTA?) with prn oxycodone.  4. Mood:LCSW to follow for evaluation and support. Seems to be accepting of thedecision forR-AKA.  5. Neuropsych: This patientis not fullycapable of making decisions onherown behalf. 6. Skin/Wound Care:Continue nephro/prostat supplements to promote healing. Monitor wounds for healing.   Patient will likely require left lower extremity amputation future 7. Fluids/Electrolytes/Nutrition:Daily weights with strict I/O. Labs with HD. 8. T2DM with neuropathy and retionpathy, coundounded by steroids:   Was on levemir 10 units daily--resumed 5 units daily, increased to 10 units on 9/16  Monitor BS ac/hs. Continue SSI for tighter control.   Slightly labile on 9/17 9. PVD with left foot wound: Question due to calciphylaxis v/s pyoderma gangrenosum.  10. A fib/Aflutter:Monitor heart rate twice daily. On Eliquis and beta-blocker twice daily 11. SLE: Continue plaquenil and low dose prednisone.  12. ESRD: Schedule HD on TTS at end of day to help with tolerance of therapy.  13. Anemia of chronic disease:Arransep weekly started on 9/12.   Hemoglobin 8.6 on 9/14  Labs with HD  Continue to monitor 14. H/o Glaucoma: Stable on Timpotic and Xalatan.  15. H/o  gastritis/erosive gastropathy: Per EGD 6/26. Continue PPI. Serial H/H monitored with HD. 16. Elevated blood pressure  Relatively controlled on 9/17 17. Leukocytosis  WBCs 10.9 on 9/14  Afebrile  Cont to monitor  LOS (Days) 5 A FACE TO FACE EVALUATION WAS PERFORMED  Natane Heward Lorie Phenix 06/15/2018 8:00 AM

## 2018-06-15 NOTE — Evaluation (Signed)
Recreational Therapy Assessment and Plan  Patient Details  Name: [name] MRN: [id number] Date of Birth: Jan 24, 1946 Today's Date: 06/15/2018  Rehab Potential:  Good ELOS: 2 weeks Assessment  Problem List:      Patient Active Problem List   Diagnosis Date Noted  . Unilateral AKA, right ([place])   . Post-operative pain   . Diabetic retinopathy associated with type 2 diabetes mellitus ([place])   . Diabetic nephropathy associated with type 2 diabetes mellitus ([place])   . Leukocytosis   . Non-healing wound of lower extremity 06/08/2018  . Malnutrition of moderate degree 03/03/2018  . Leg wound, right 03/01/2018  . Ulcer of right calf ([place]) 02/10/2018  . Chronic RLQ pain 01/11/2018  . Ischemic ulcer of toes on both feet ([place])   . Cellulitis in diabetic foot ([place]) 12/14/2017  . Atrial fibrillation ([place]) 12/14/2017  . Glaucoma 12/14/2017  . ESRD (end stage renal disease) on dialysis ([place]) 11/16/2017  . COPD with acute exacerbation ([place]) 11/03/2017  . Community acquired pneumonia 11/02/2017  . Nausea and vomiting 11/02/2017  . Iron deficiency anemia 08/07/2017  . Heme positive stool 10/04/2015  . Mixed hyperlipidemia 06/29/2015  . Type 2 diabetes mellitus with stage 4 chronic kidney disease, with long-term current use of insulin ([place]) 06/29/2015  . Chronic kidney disease, stage V ([place]) 04/12/2014  . 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 Acute respiratory failure with hypoxia ([place]) 03/15/2014  . Chronic diastolic heart failure ([place]) 03/15/2014  . Anemia of chronic renal failure, stage 4 (severe) ([place]) 02/23/2014  . Folate deficiency 02/17/2014  . Aortic regurgitation 09/19/2012  . Class 2 severe obesity due to excess calories with serious comorbidity and body mass index (BMI) of 35.0 to 35.9 in adult ([place]) 09/14/2012  . Type 2 diabetes mellitus with diabetic neuropathy ([place]) 09/14/2012  . SLE (systemic lupus  erythematosus) ([place]) 09/14/2012  . Essential hypertension, benign 09/14/2012  . COPD (chronic obstructive pulmonary disease) ([place]) 09/14/2012    Past Medical History:      Past Medical History:  Diagnosis Date  . Anemia   . Arthritis   . Asthma   . Atrial fibrillation ([place])   . Atrial flutter [facility])    TEE/DCCV December 2013 - on Xarelto [facility])  . Bone spur of toe    Right 5th toe  . Chronic diastolic CHF (congestive heart failure) (HCC)    a. EF 50-55% by echo in 2015  . CKD (chronic kidney disease) stage 4, GFR 15-29 ml/min (H[facility])    a. s/p fistula placement but not on HD. Followed by Nephrology.   . Colon polyposis   . COPD (chronic obstructive pulmonary disease) ([place])   . DVT of upper extremity (deep vein thrombosis) ([place])    Right basilic vein, June 4010  . Essential hypertension, benign   . Fractures   . Glaucoma   . SLE (systemic lupus erythematosus) ([place])   . Type 2 diabetes mellitus ([place])   . UTI (lower urinary tract infection)   . Wound of right leg 06/2017   Past Surgical History:  Past Surgical History:  Procedure Laterality Date  . ABDOMINAL AORTOGRAM W/LOWER EXTREMITY N/A 12/18/2017   Procedure: ABDOMINAL AORTOGRAM W/LOWER EXTREMITY;  Surgeon: Elam Dutch, MD;  Location: [place] CV LAB;  Service: Cardiovascular;  Laterality: N/A;  bilateral  . ABDOMINAL HYSTERECTOMY  1983  . AMPUTATION Right 06/08/2018   Procedure: RIGHT ABOVE KNEE AMPUTATION;  Surgeon: Elam Dutch, MD;  Location: [facility]  OR;  Service: Vascular;  Laterality: Right;  . ANKLE SURGERY  1993  . AV FISTULA PLACEMENT Left 03/27/2014   Procedure: ARTERIOVENOUS (AV) FISTULA CREATION;  Surgeon: Rosetta Posner, MD;  Location: [place];  Service: Vascular;  Laterality: Left;  . BACK SURGERY  1980  . BASCILIC VEIN TRANSPOSITION Left 01/13/2018   Procedure: LEFT BASILIC VEIN TRANSPOSITION SECOND STAGE;  Surgeon: Elam Dutch, MD;  Location: [place];  Service: Vascular;   Laterality: Left;  . CARDIOVASCULAR STRESS TEST  12/19/2009   no stress induced rhythm abnormalities, ekg negative for ischemia  . CARDIOVERSION  09/16/2012   Procedure: CARDIOVERSION;  Surgeon: Sanda Klein, MD;  [facility] END[facility];  Service: Cardiovascular;  Laterality: N/A;  . CATARACT EXTRACTION    . COLONOSCOPY  09/20/2012   Dr. [name]: multiple tubular adenomas   . COLONOSCOPY  2005   Dr. [name]. Polyps, path unknown   . COLONOSCOPY N/A 07/24/2016   Procedure: COLONOSCOPY;  Surgeon: [name], MD;  [facility];  Service: Endoscopy;  Laterality: N/A;  9:00 am  . ESOPHAGOGASTRODUODENOSCOPY  09/20/2012   Dr. [name]: duodenal erosion and possible resolving ulcer at the angularis   . ESOPHAGOGASTRODUODENOSCOPY N/A 03/24/2018   Procedure: ESOPHAGOGASTRODUODENOSCOPY (EGD);  Surgeon: [name], MD;  [facility];  Service: Endoscopy;  Laterality: N/A;  12:45pm- moved up to arrive at 8:30am per Tretha Sciara  . EYE SURGERY    . IR FLUORO GUIDE CV LINE RIGHT  11/09/2017  . IR US GUIDE VASC ACCESS RIGHT  11/09/2017  . REVISON OF ARTERIOVENOUS FISTULA Left 03/15/2018   Procedure: REVISION OF Left arm BASILIC VEIN TRANSPOSITION;  Surgeon: Angelia Mould, MD;  Location: [place];  Service: Vascular;  Laterality: Left;  . TEE WITHOUT CARDIOVERSION  09/16/2012   Procedure: TRANSESOPHAGEAL ECHOCARDIOGRAM (TEE);  Surgeon: Sanda Klein, MD;  Location: [facility] ENDOSCOPY;  Service: Cardiovascular;  Laterality: N/A;  . TRANSESOPHAGEAL ECHOCARDIOGRAM WITH CARDIOVERSION  09/16/2012   EF 60-65%, moderate LVH, moderate regurg of the aortic valve, LA moderately dilated    Assessment & Plan Clinical Impression: Patient is a 72 y.o. year old female history of T2DM with neuropathy nephropathy and retinopathy, COPD, DVT, A. fib/a flutter, bilateral lower extremity wounds with progression of right lower extremity except ulcers to calf and anterior  leg,inability to walk and significant pain.Patient was finally agreeable to undergo surgical intervention and was admitted on 06/08/2018 for right AKA by Dr. Oneida Alar.   Patient transferred to CIR on 06/10/2018.  Pt presents with decreased activity tolerance, decreased functional mobility, decreased balance, acute pain Limiting pt's independence with leisure/community pursuits.  Met with pt to discuss TR services and pt states fatigue from current therapy schedule.  Addressed stress management per team referral and pt states that she is not easily stressed or anxious.  Pt further stated that she has had to let things go over the years vs building up because it would have a negative impact on her health.  Pt states that she uses diversion and leisure participation to assist with managing stress when it builds up. Pt stated that she is currently feeling ok about hospitalization and recovery but recognizes that she gets tired quickly.  Plan No further TR as pt has low activity tolerance.  Will continue to monitor through team.  Recommendations for other services: None   Discharge Criteria: Patient will be discharged from TR if patient refuses treatment 3 consecutive times without medical reason.  If treatment goals not met,  if there is a change in medical status, if patient makes no progress towards goals or if patient is discharged from hospital.  The above assessment, treatment plan, treatment alternatives and goals were discussed and mutually agreed upon: by patient  [place] 06/15/2018, 1:20 PM

## 2018-06-15 NOTE — Progress Notes (Signed)
Occupational Therapy Session Note  Patient Details  Name: [name] MRN: [id number] Date of Birth: 08/19/1946  Today's Date: 06/15/2018 OT Individual Time: 9191-6606 OT Individual Time Calculation (min): 60 min    Short Term Goals: Week 1:  OT Short Term Goal 1 (Week 1): Pt will perform transfer to wide drop arm BSC with mod A  OT Short Term Goal 2 (Week 1): Pt will perform lateral leans with min A while pulling up pants OT Short Term Goal 3 (Week 1): Pt will don pants with mod A (2/3 steps) EOB OT Short Term Goal 4 (Week 1): Pt will setup w/c in prep for transfers with supervision with mod cues  Skilled Therapeutic Interventions/Progress Updates:    1:1 SElf care retraining at bed level. Pt requested to use the bed pan ; pt able to roll on and off of it but requires A for pericare. Brief donned with total A. Pt able to come from a flat bed into sitting EOB with instructional cues and bed rail. Pt bathed and dressed with lateral leans. Pt able to come into lateral lean and then return to sitting position with supervision with cues but did require A to manage clothing. Pt performed min A slide board transfer with more than reasonable amt of time and cues for hand placement. PT can perform grooming mod I to setup (for some items) LEft sitting up with the RN.  Therapy Documentation Precautions:  Precautions Precautions: Fall, Other (comment) Precaution Comments: R AKA, L foot wounds Restrictions Weight Bearing Restrictions: Yes RLE Weight Bearing: Non weight bearing Other Position/Activity Restrictions: caution about trying to put weight through left LE due to wounds on forefoot Pain: Pain Assessment Pain Scale: 0-10 Pain Score: 8  Pain Type: Surgical pain Pain Location: Leg Pain Orientation: Right Pain Descriptors / Indicators: Sore Pain Onset: On-going Pain Intervention(s): Medication (See eMAR) and repositioning   See Function Navigator for Current Functional  Status.   Therapy/Group: Individual Therapy  Willeen Cass [facility] 06/15/2018, 10:43 AM

## 2018-06-15 NOTE — Progress Notes (Addendum)
[facility] Progress Note   Subjective:  Seen on HD - 2L UF goal. No issues so far. No CP/dyspnea. Tells me that PT is going well and that she has been able to do all of the exercises.  Objective Vitals:   06/14/18 0958 06/14/18 1327 06/14/18 1938 06/15/18 0444  BP: (!) 129/40 (!) 134/41 (!) 146/62 (!) 134/36  Pulse: 71 75 80 86  Resp:  16 18 18   Temp:  (!) 97.5 F (36.4 C) 98.2 F (36.8 C) 98.7 F (37.1 C)  TempSrc:  Oral Oral Oral  SpO2:  100% 100% 100%  Weight:    62.2 kg  Height:       Physical Exam General: Frail woman, NAD Heart: RRR; 2/6 systolic murmur Lungs: CTAB Extremities: No LLE edema (foot bandaged); R AKA with bandaged stump Dialysis Access: AVG + [facility]  Additional Objective Labs: Basic Metabolic Panel: Recent Labs  Lab 06/09/18 0344 06/10/18 0409 06/12/18 1900  NA 136 135 131*  K 3.9 3.7 4.3  CL 98 100 95*  CO2 25 24 24   GLUCOSE 191* 174* 163*  BUN 30* 47* 53*  CREATININE 3.00* 4.38* 4.82*  CALCIUM 7.7* 7.8* 8.0*  PHOS  --   --  4.3   Liver Function Tests: Recent Labs  Lab 06/12/18 1900  ALBUMIN 1.7*   CBC: Recent Labs  Lab 06/09/18 0344 06/10/18 0409 06/12/18 1859  WBC 12.0* 9.4 10.9*  HGB 7.7* 8.3* 8.6*  HCT 26.8* 29.4* 30.2*  MCV 92.1 95.1 94.1  PLT 276 252 269   Iron Studies:  Recent Labs    06/12/18 1859  IRON 30  TIBC 132*  FERRITIN 1,227*   Medications:  . allopurinol  100 mg Oral Q M,W,F  . apixaban  5 mg Oral BID  . atorvastatin  20 mg Oral QHS  . brimonidine  1 drop Left Eye BID   And  . timolol  1 drop Left Eye BID  . Chlorhexidine Gluconate Cloth  6 each Topical Q0600  . [START ON 06/17/2018] darbepoetin (ARANESP) injection - DIALYSIS  200 mcg Intravenous Q Thu-HD  . dicyclomine  10 mg Oral TID AC & HS  . docusate sodium  100 mg Oral Daily  . feeding supplement (NEPRO CARB STEADY)  237 mL Oral TID WC  . feeding supplement (PRO-STAT SUGAR FREE 64)  30 mL Oral BID  . fentaNYL  50 mcg  Transdermal Q72H  . hydroxychloroquine  200 mg Oral Daily  . insulin aspart  0-15 Units Subcutaneous TID WC  . insulin detemir  10 Units Subcutaneous Daily  . latanoprost  1 drop Both Eyes QHS  . metoprolol tartrate  12.5 mg Oral 2 times per day on Sun Mon Wed Fri  . multivitamin  1 tablet Oral QHS  . pantoprazole  40 mg Oral Daily  . predniSONE  5 mg Oral Q breakfast    Dialysis Orders: TTS at [facility] 3:30hr, 400/800, EDW 69.5kg, 2K/2.5Ca, AVF, heparin 2000 bolus - Venofer 50mg  IV weekly - Mircera 261mcg IV q 2 weeks (last given 9/5 - Hgb 7.4 at that time)  Assessment/Plan: 1. S/p R AKA 9/10 for severe RLE wounds: In CIR - doing well with PT. 2.  ESRD:Continue TTS schedule, next 9/17. Tight heparin. 3. Hypertension/volume:BPcontrolled, EDW will be loweredon d/c. 4. Anemia:Hgb 8.6, continue Aranesp 269mcg q Thursday. 5. Metabolic bone disease:Labs ok. No VDRA. 6. Nutrition:Alb very low,continue Nepro and Pro-stat supps. 7. Type 2 DM: Per primary. 8. PVD, L foot  wound: Per primary (?calciphylaxis).  Veneta Penton, PA-C 06/15/2018, 1:23 PM  [facility] Pager: [phone]  Pt seen, examined and agree w A/P as above.  Kelly Splinter MD [employer] pager [phone]   06/15/2018, 2:34 PM

## 2018-06-15 NOTE — Plan of Care (Signed)
  Problem: Consults Goal: RH LIMB LOSS PATIENT EDUCATION Description Description: See Patient Education module for eduction specifics. Outcome: Progressing Goal: Skin Care Protocol Initiated - if Braden Score 18 or less Description If consults are not indicated, leave blank or document N/A Outcome: Progressing Goal: Diabetes Guidelines if Diabetic/Glucose > 140 Description If diabetic or lab glucose is > 140 mg/dl - Initiate Diabetes/Hyperglycemia Guidelines & Document Interventions  Outcome: Progressing   Problem: RH SKIN INTEGRITY Goal: RH STG SKIN FREE OF INFECTION/BREAKDOWN Description Skin to remain free from infection and breakdown with mod assist while on rehab.  Outcome: Progressing Goal: RH STG ABLE TO PERFORM INCISION/WOUND CARE W/ASSISTANCE Description STG Able To Perform Incision and Wound Care With max Assistance.  Outcome: Progressing   Problem: RH SAFETY Goal: RH STG ADHERE TO SAFETY PRECAUTIONS W/ASSISTANCE/DEVICE Description STG Adhere to Safety Precautions With mod Assistance and appropriate Device.  Outcome: Progressing Goal: RH STG DECREASED RISK OF FALL WITH ASSISTANCE Description STG Decreased Risk of Fall With min Assistance.  Outcome: Progressing   Problem: RH PAIN MANAGEMENT Goal: RH STG PAIN MANAGED AT OR BELOW PT'S PAIN GOAL Description <4 on a 1-10 pain scale  Outcome: Progressing   Problem: RH KNOWLEDGE DEFICIT LIMB LOSS Goal: RH STG INCREASE KNOWLEDGE OF SELF CARE AFTER LIMB LOSS Description Patient and caregiver will demonstrate knowledge on incisional care, wound care, and residual limb with min assist from rehab staff.  Outcome: Progressing   Problem: Consults Goal: RH GENERAL PATIENT EDUCATION Description See Patient Education module for education specifics. Outcome: Progressing Goal: Skin Care Protocol Initiated - if Braden Score 18 or less Description If consults are not indicated, leave blank or document N/A Outcome:  Progressing Goal: Nutrition Consult-if indicated Outcome: Progressing Goal: Diabetes Guidelines if Diabetic/Glucose > 140 Description If diabetic or lab glucose is > 140 mg/dl - Initiate Diabetes/Hyperglycemia Guidelines & Document Interventions  Outcome: Progressing

## 2018-06-15 NOTE — Progress Notes (Signed)
Physical Therapy Session Note  Patient Details  Name: [name] MRN: [id number] Date of Birth: August 03, 1946  Today's Date: 06/15/2018 PT Individual Time: 1000-1045 PT Individual Time Calculation (min): 45 min   Short Term Goals: Week 1:  PT Short Term Goal 1 (Week 1): Pt will perform bed mobility with Supervision PT Short Term Goal 2 (Week 1): Pt will perform least restrictive transfer with min A PT Short Term Goal 3 (Week 1): Pt will propel w/c x 100 ft with Supervision  Skilled Therapeutic Interventions/Progress Updates: Pt presented in w/c agreeable to therapy. Pt propelled w/c to rehab gym supervision with x 1 brief rest due to fatigue. Pt indicating 8/10 pain at residual limb, per pt recently received pain meds. Pt performed SB transfer to level surface minA with verbal cues for head/hips relationship. Pt participated in ball toss with 2lb dowel x 3 min for endurance. Pt performed SB transfer to R to return to w/c with slight incline. PTA encouraged pt to attempt to place SB on own. Multimodal cues for increasing lateral leans and HOH assist for placement of SB. Pt required mod then maxA for completion of transfer. Pt required tactile cues for anterior wt shifting. Pt transported back to room and handed off to OT for following session.      Therapy Documentation Precautions:  Precautions Precautions: Fall, Other (comment) Precaution Comments: R AKA, L foot wounds Restrictions Weight Bearing Restrictions: Yes RLE Weight Bearing: Non weight bearing Other Position/Activity Restrictions: caution about trying to put weight through left LE due to wounds on forefoot General:   Vital Signs: Therapy Vitals Pulse Rate: 86 BP: (!) 125/58  See Function Navigator for Current Functional Status.   Therapy/Group: Individual Therapy  Mirranda Monrroy  Frady Taddeo, PTA  06/15/2018, 4:26 PM

## 2018-06-15 NOTE — Progress Notes (Signed)
Occupational Therapy Session Note  Patient Details  Name: [name] MRN: [id number] Date of Birth: 1945-11-23  Today's Date: 06/15/2018 OT Individual Time: 4128-7867 OT Individual Time Calculation (min): 74 min    Short Term Goals: Week 1:  OT Short Term Goal 1 (Week 1): Pt will perform transfer to wide drop arm BSC with mod A  OT Short Term Goal 2 (Week 1): Pt will perform lateral leans with min A while pulling up pants OT Short Term Goal 3 (Week 1): Pt will don pants with mod A (2/3 steps) EOB OT Short Term Goal 4 (Week 1): Pt will setup w/c in prep for transfers with supervision with mod cues  Skilled Therapeutic Interventions/Progress Updates:    Pt presents up in w/c with hand off from PT session. Pt taking seated rest break as she just self-propelled w/c back to room from therapy gym. Pt with intermittent moments of increased pain in RLE, alleviated with rest and repositioning. During seated rest break discussed goals for session including practice of functional transfers to padded tub bench as option for use during toileting task. Pt performs slide board transfer w/c>TTB>EOB with overall minA given increased time for each transfer and pt taking rest breaks in between transfers. Pt reports possible need for brief change due to urination. Transitioned to supine from sitting EOB where pt rolled to L/R with minguard-minA. maxA for removing pants from hips and totalA for brief management. Pt able to perform peri-care while in supine with setup assist. TotalA to don new brief and to re-don pants at bed level. Pt left supine in bed end of session with bed alarm set, call bell and needs within reach.   Therapy Documentation Precautions:  Precautions Precautions: Fall, Other (comment) Precaution Comments: R AKA, L foot wounds Restrictions Weight Bearing Restrictions: Yes RLE Weight Bearing: Non weight bearing Other Position/Activity Restrictions: caution about trying to put weight  through left LE due to wounds on forefoot   See Function Navigator for Current Functional Status.   Therapy/Group: Individual Therapy  Raymondo Band 06/15/2018, 7:53 AM

## 2018-06-16 ENCOUNTER — Inpatient Hospital Stay (HOSPITAL_COMMUNITY): Payer: Medicare Other | Admitting: Occupational Therapy

## 2018-06-16 ENCOUNTER — Telehealth: Payer: Self-pay | Admitting: Vascular Surgery

## 2018-06-16 ENCOUNTER — Inpatient Hospital Stay (HOSPITAL_COMMUNITY): Payer: Medicare Other

## 2018-06-16 ENCOUNTER — Inpatient Hospital Stay (HOSPITAL_COMMUNITY): Payer: Medicare Other | Admitting: Physical Therapy

## 2018-06-16 ENCOUNTER — Ambulatory Visit: Payer: Medicare Other | Admitting: Internal Medicine

## 2018-06-16 DIAGNOSIS — N186 End stage renal disease: Secondary | ICD-10-CM

## 2018-06-16 DIAGNOSIS — Z992 Dependence on renal dialysis: Secondary | ICD-10-CM

## 2018-06-16 LAB — GLUCOSE, CAPILLARY
GLUCOSE-CAPILLARY: 109 mg/dL — AB (ref 70–99)
GLUCOSE-CAPILLARY: 149 mg/dL — AB (ref 70–99)
GLUCOSE-CAPILLARY: 122 mg/dL — AB (ref 70–99)
Glucose-Capillary: 162 mg/dL — ABNORMAL HIGH (ref 70–99)

## 2018-06-16 NOTE — Progress Notes (Signed)
PHYSICAL MEDICINE & REHABILITATION     PROGRESS NOTE  Subjective/Complaints:  Patient seen laying in bed this AM.  She states she slept well overnight.  She has questions about team conference.   ROS: Denies CP, SOB, N/V/D.  Objective: Vital Signs: Blood pressure 127/78, pulse 78, temperature 97.8 F (36.6 C), temperature source Oral, resp. rate 18, height 5\' 4"  (1.626 m), weight 63 kg, SpO2 98 %. No results found. Recent Labs    06/15/18 1320  WBC 9.6  HGB 8.5*  HCT 29.7*  PLT 269   Recent Labs    06/15/18 1320  NA 133*  K 3.7  CL 95*  GLUCOSE 143*  BUN 68*  CREATININE 5.28*  CALCIUM 8.1*   CBG (last 3)  Recent Labs    06/15/18 1757 06/15/18 2103 06/16/18 0646  GLUCAP 92 165* 162*    Wt Readings from Last 3 Encounters:  06/16/18 63 kg  06/10/18 65.4 kg  06/04/18 79.4 kg    Physical Exam:  BP 127/78 (BP Location: Right Arm)   Pulse 78   Temp 97.8 F (36.6 C) (Oral)   Resp 18   Ht 5\' 4"  (1.626 m)   Wt 63 kg   SpO2 98%   BMI 23.84 kg/m  Constitutional: She has sickly appearance. NAD. HENT: normocephalic. Atraumatic Eyes: disconjugate gaze Cardiac: RRR. No JVD. Lungs: Clear. Unlabored. Abdomen: Positive bowel sounds, nondistended Musculoskeletal: Right AKA with edema, stable Neurologic: Alert. Motor:  4/5 left hip flexors, knee extensors, ankle dorsiflexor, unchanged 4+/5 right hip flexion, unchanged Skin: Right AKA with dressing C/D/I Left lower extremity dressing c/d/i  Assessment/Plan: 1. Functional deficits secondary to right AKA with left lower extremity dry gangrene which require 3+ hours per day of interdisciplinary therapy in a comprehensive inpatient rehab setting. Physiatrist is providing close team supervision and 24 hour management of active medical problems listed below. Physiatrist and rehab team continue to assess barriers to discharge/monitor patient progress toward functional and medical  goals.  Function:  Bathing Bathing position   Position: Sitting EOB  Bathing parts Body parts bathed by patient: Right arm, Left arm, Chest, Abdomen, Right upper leg, Left upper leg Body parts bathed by helper: Front perineal area, Buttocks, Back  Bathing assist Assist Level: Set up   Set up : To obtain items  Upper Body Dressing/Undressing Upper body dressing   What is the patient wearing?: Pull over shirt/dress, Bra Bra - Perfomed by patient: Thread/unthread right bra strap, Thread/unthread left bra strap Bra - Perfomed by helper: Hook/unhook bra (pull down sports bra) Pull over shirt/dress - Perfomed by patient: Thread/unthread right sleeve, Thread/unthread left sleeve, Put head through opening, Pull shirt over trunk          Upper body assist Assist Level: Supervision or verbal cues      Lower Body Dressing/Undressing Lower body dressing   What is the patient wearing?: Pants     Pants- Performed by patient: Thread/unthread right pants leg, Thread/unthread left pants leg Pants- Performed by helper: Pull pants up/down                      Lower body assist Assist for lower body dressing: Touching or steadying assistance (Pt > 75%)      Toileting Toileting     Toileting steps completed by helper: Performs perineal hygiene, Adjust clothing after toileting    Toileting assist Assist level: Supervision or verbal cues   Transfers Chair/bed transfer   Chair/bed transfer  method: Lateral scoot Chair/bed transfer assist level: Touching or steadying assistance (Pt > 75%) Chair/bed transfer assistive device: Armrests, Sliding board     Locomotion Ambulation Ambulation activity did not occur: Safety/medical concerns         Wheelchair   Type: Manual Max wheelchair distance: 50 Assist Level: Supervision or verbal cues  Cognition Comprehension Comprehension assist level: Understands basic 90% of the time/cues < 10% of the time  Expression Expression assist  level: Expresses basic needs/ideas: With extra time/assistive device  Social Interaction Social Interaction assist level: Interacts appropriately 90% of the time - Needs monitoring or encouragement for participation or interaction.  Problem Solving Problem solving assist level: Solves basic 90% of the time/requires cueing < 10% of the time  Memory Memory assist level: Recognizes or recalls 90% of the time/requires cueing < 10% of the time    Medical Problem List and Plan: 1. Decline in functional mobility and ADLs  secondary to Right Above knee amputation  Cont CIR 2.H/oDVT/ A fib/anticoagulation:Pharmaceutical:Other (comment)--Eliquis. 3.ChronicPain Management:On fentanyl 50 mcg (100 mcg PTA?) with prn oxycodone.  4. Mood:LCSW to follow for evaluation and support. Seems to be accepting of thedecision forR-AKA.  5. Neuropsych: This patientis not fullycapable of making decisions onherown behalf. 6. Skin/Wound Care:Continue nephro/prostat supplements to promote healing. Monitor wounds for healing.   Patient will likely require left lower extremity amputation future 7. Fluids/Electrolytes/Nutrition:Daily weights with strict I/O. Labs with HD. 8. T2DM with neuropathy and retionpathy, coundounded by steroids:   Was on levemir 10 units daily--resumed 5 units daily, increased to 10 units on 9/16  Monitor BS ac/hs. Continue SSI for tighter control.   Elevated/Slightly labile on 9/18, will consider further mediations adjustments tomorrow if necessary 9. PVD with left foot wound: Question due to calciphylaxis v/s pyoderma gangrenosum.  10. A fib/Aflutter:Monitor heart rate twice daily. On Eliquis and beta-blocker twice daily 11. SLE: Continue plaquenil and low dose prednisone.  12. ESRD: Schedule HD on TTS at end of day to help with tolerance of therapy.  13. Anemia of chronic disease:Arransep weekly started on 9/12.   Hemoglobin 8.5 on 9/17  Labs with HD  Continue to  monitor 14. H/o Glaucoma: Stable on Timpotic and Xalatan.  15. H/o gastritis/erosive gastropathy: Per EGD 6/26. Continue PPI. Serial H/H monitored with HD. 16. Elevated blood pressure  Relatively controlled on 9/18 17. Leukocytosis: Resolved  WBCs 9.6 on 9/17  Afebrile  Cont to monitor  LOS (Days) 6 A FACE TO FACE EVALUATION WAS PERFORMED  Namish Krise Lorie Phenix 06/16/2018 7:27 AM

## 2018-06-16 NOTE — Progress Notes (Signed)
Occupational Therapy Session Note  Patient Details  Name: [name] MRN: [id number] Date of Birth: December 04, 1945  Today's Date: 06/16/2018 OT Individual Time: 1430-1500 OT Individual Time Calculation (min): 30 min    Short Term Goals: Week 1:  OT Short Term Goal 1 (Week 1): Pt will perform transfer to wide drop arm BSC with mod A  OT Short Term Goal 2 (Week 1): Pt will perform lateral leans with min A while pulling up pants OT Short Term Goal 3 (Week 1): Pt will don pants with mod A (2/3 steps) EOB OT Short Term Goal 4 (Week 1): Pt will setup w/c in prep for transfers with supervision with mod cues  Skilled Therapeutic Interventions/Progress Updates:    Pt seen for OT session focusing on education regarding DME and planning for self care tasks. Pt sitting up in w/c upon arrival with husband present. Pt denying increased fatigue from previous sessions but denying pain. Extensive discussion and education regarding pt's plans for positioning of bathing/dressing as pt currently completing bathing/dressing and toileting tasks from  Bed level, however, sleeps in recliner at home and does not use standard bed. Demonstration and education provided regarding padded tub bench with cutout vs bariatric BSC in order to increase safety and ease with sliding board transfers and allow space for bathing/dressing tasks. Plans to complete AM ADLs from this position tomorrow in order to assess pt's preference and safety with various positions and DME. Pt and husband will require assist to order any DME.  Pt left seated in w/c at end of session with husband present, all needs in reach.    Therapy Documentation Precautions:  Precautions Precautions: Fall, Other (comment) Precaution Comments: R AKA, L foot wounds Restrictions Weight Bearing Restrictions: Yes RLE Weight Bearing: Non weight bearing Other Position/Activity Restrictions: caution about trying to put weight through left LE due to wounds on  forefoot  See Function Navigator for Current Functional Status.   Therapy/Group: Individual Therapy  Jaydi Bray L 06/16/2018, 8:09 AM

## 2018-06-16 NOTE — Progress Notes (Signed)
Occupational Therapy Session Note  Patient Details  Name: [name] MRN: [id number] Date of Birth: Jan 29, 1946  Today's Date: 06/16/2018 OT Individual Time: 1030-1055 OT Individual Time Calculation (min): 25 min    Short Term Goals: Week 1:  OT Short Term Goal 1 (Week 1): Pt will perform transfer to wide drop arm BSC with mod A  OT Short Term Goal 2 (Week 1): Pt will perform lateral leans with min A while pulling up pants OT Short Term Goal 3 (Week 1): Pt will don pants with mod A (2/3 steps) EOB OT Short Term Goal 4 (Week 1): Pt will setup w/c in prep for transfers with supervision with mod cues  Skilled Therapeutic Interventions/Progress Updates:    Pt resting in w/c upon arrival and agreeable to therapy.  OT intervention with focus on BUE therex to increase strength and independence with BADLs and slide board transfers.  Pt demonstrated Theraband exercises from earlier therapy session with another therapist (total 6 X 8). Pt instructed in additional exercises with Theraband and 2# weight bar (3 X 8 of chest presses and straight arm raises). Pt c/o R hip pain seated in w/c. Assisted pt with repositioning. Pt remained in w/c with all needs within reach.    Therapy Documentation Precautions:  Precautions Precautions: Fall, Other (comment) Precaution Comments: R AKA, L foot wounds Restrictions Weight Bearing Restrictions: Yes RLE Weight Bearing: Non weight bearing Other Position/Activity Restrictions: caution about trying to put weight through left LE due to wounds on forefoot   Pain: Pt c/o r hip pain seated in w/c (4/10); repositioned    See Function Navigator for Current Functional Status.   Therapy/Group: Individual Therapy  Leroy Libman 06/16/2018, 11:01 AM

## 2018-06-16 NOTE — Progress Notes (Signed)
Physical Therapy Session Note  Patient Details  Name: [name] MRN: [id number] Date of Birth: Jul 07, 1946  Today's Date: 06/16/2018 PT Individual Time: 0900-1000 PT Individual Time Calculation (min): 60 min   Short Term Goals: Week 1:  PT Short Term Goal 1 (Week 1): Pt will perform bed mobility with Supervision PT Short Term Goal 2 (Week 1): Pt will perform least restrictive transfer with min A PT Short Term Goal 3 (Week 1): Pt will propel w/c x 100 ft with Supervision  Skilled Therapeutic Interventions/Progress Updates: Pt received in bed, denies pain and agreeable to treatment. RLE residual limb wrapped with ace wraps including pelvic anchor and sock on top. Pt dons pants in semi-reclined position with setupA and increased time with pt declining assistance as she wants to do it independently. Once pt nearly completed pulling pants over hips, reports urgency to void. Rolling R/L minA to place/remove bedpan d/t urgency. Total for clothing management and hygiene. Supine>sit with minA. Transfer bed>w/c with transfer board and minA, increased time required, cues for technique and head/hips relationship. Remained seated in w/c at end of session, all needs in reach.      Therapy Documentation Precautions:  Precautions Precautions: Fall, Other (comment) Precaution Comments: R AKA, L foot wounds Restrictions Weight Bearing Restrictions: Yes RLE Weight Bearing: Non weight bearing Other Position/Activity Restrictions: caution about trying to put weight through left LE due to wounds on forefoot   See Function Navigator for Current Functional Status.   Therapy/Group: Individual Therapy  Corliss Skains 06/16/2018, 11:16 AM

## 2018-06-16 NOTE — Progress Notes (Signed)
  [facility] Progress Note   Subjective:  Seen in room. Feels well today. No leg pain, CP, dyspnea.   Objective Vitals:   06/15/18 1630 06/15/18 1657 06/15/18 1937 06/16/18 0505  BP: (!) 107/51 (!) 140/50 (!) 142/62 127/78  Pulse: 92 83 74 78  Resp:  18 18 18   Temp:  98.5 F (36.9 C) 98.3 F (36.8 C) 97.8 F (36.6 C)  TempSrc:  Oral Oral Oral  SpO2:   93% 98%  Weight:  62.7 kg  63 kg  Height:       Physical Exam General:Frail woman, NAD Heart:RRR; 2/6 systolic murmur Lungs:CTAB Extremities:No LLE edema (foot bandaged); R AKA with bandaged stump Dialysis Access:AVG + [facility] [address]  Additional Objective Labs: Basic Metabolic Panel: Recent Labs  Lab 06/10/18 0409 06/12/18 1900 06/15/18 1320  NA 135 131* 133*  K 3.7 4.3 3.7  CL 100 95* 95*  CO2 24 24 26   GLUCOSE 174* 163* 143*  BUN 47* 53* 68*  CREATININE 4.38* 4.82* 5.28*  CALCIUM 7.8* 8.0* 8.1*  PHOS  --  4.3 4.3   Liver Function Tests: Recent Labs  Lab 06/12/18 1900 06/15/18 1320  ALBUMIN 1.7* 1.7*   CBC: Recent Labs  Lab 06/10/18 0409 06/12/18 1859 06/15/18 1320  WBC 9.4 10.9* 9.6  HGB 8.3* 8.6* 8.5*  HCT 29.4* 30.2* 29.7*  MCV 95.1 94.1 94.9  PLT 252 269 269   CBG: Recent Labs  Lab 06/15/18 1147 06/15/18 1607 06/15/18 1757 06/15/18 2103 06/16/18 0646  GLUCAP 153* 91 92 165* 162*   Medications:  . allopurinol  100 mg Oral Q M,W,F  . apixaban  5 mg Oral BID  . atorvastatin  20 mg Oral QHS  . brimonidine  1 drop Left Eye BID   And  . timolol  1 drop Left Eye BID  . Chlorhexidine Gluconate Cloth  6 each Topical Q0600  . [START ON 06/17/2018] darbepoetin (ARANESP) injection - DIALYSIS  200 mcg Intravenous Q Thu-HD  . dicyclomine  10 mg Oral TID AC & HS  . docusate sodium  100 mg Oral Daily  . feeding supplement (NEPRO CARB STEADY)  237 mL Oral TID WC  . feeding supplement (PRO-STAT SUGAR FREE 64)  30 mL Oral BID  . fentaNYL  50 mcg Transdermal Q72H  . hydroxychloroquine   200 mg Oral Daily  . insulin aspart  0-15 Units Subcutaneous TID WC  . insulin detemir  10 Units Subcutaneous Daily  . latanoprost  1 drop Both Eyes QHS  . metoprolol tartrate  12.5 mg Oral 2 times per day on Sun Mon Wed Fri  . multivitamin  1 tablet Oral QHS  . pantoprazole  40 mg Oral Daily  . predniSONE  5 mg Oral Q breakfast    Dialysis Orders: TTS at [facility] 3:30hr, 400/800, EDW 69.5kg, 2K/2.5Ca, AVF, heparin 2000 bolus - Venofer 50mg  IV weekly - Mircera 251mcg IV q 2 weeks (last given 9/5 - Hgb 7.4 at that time)  Assessment/Plan: 1. S/p R AKA 9/10 for severe RLE wounds:In CIR - doing well with PT. 2. ESRD:Continue TTS schedule, next 9/19. Tight heparin. 3. Hypertension/volume:BPcontrolled, EDW will be loweredon d/c. 4. Anemia:Hgb8.5, continue Aranesp 213mcg q Thursday. 5. Metabolic bone disease:Labs ok.No VDRA. 6. Nutrition:Alb very low,continue Nepro and Pro-stat supps. 7. Type 2 DM: Per primary. 8. PVD, L foot wound:Per primary (?calciphylaxis).  Veneta Penton, PA-C 06/16/2018, 11:31 AM  [facility] Pager: [phone]

## 2018-06-16 NOTE — Plan of Care (Signed)
  Problem: RH Bed Mobility Goal: LTG Patient will perform bed mobility with assist (PT) Description LTG: Patient will perform bed mobility with assistance, with/without cues (PT). Flowsheets (Taken 06/16/2018 0925) LTG: Pt will perform bed mobility with assistance level of: Minimal Assistance - Patient > 75% (downgraded d/t slow progress) Note:  Downgraded d/t slow progress   Problem: RH Bed to Chair Transfers Goal: LTG Patient will perform bed/chair transfers w/assist (PT) Description LTG: Patient will perform bed to chair transfers with assistance (PT). Flowsheets (Taken 06/16/2018 0925) LTG: Pt will perform Bed to Chair Transfers with assistance level  : Minimal Assistance - Patient > 75% (recliner <>chair as pt does not sleep in bed at home; downgraded d/t slow progress, WB limitations) Note:  Recliner <>w/c; downgraded d/t slow progress, WB limitations

## 2018-06-16 NOTE — Progress Notes (Signed)
Occupational Therapy Session Note  Patient Details  Name: SHEROLYN TRETTIN MRN: [id number] Date of Birth: November 05, 1945  Today's Date: 06/16/2018 OT Individual Time: 1130-1158 OT Individual Time Calculation (min): 28 min  Makeup Session   Short Term Goals: Week 1:  OT Short Term Goal 1 (Week 1): Pt will perform transfer to wide drop arm BSC with mod A  OT Short Term Goal 2 (Week 1): Pt will perform lateral leans with min A while pulling up pants OT Short Term Goal 3 (Week 1): Pt will don pants with mod A (2/3 steps) EOB OT Short Term Goal 4 (Week 1): Pt will setup w/c in prep for transfers with supervision with mod cues  Skilled Therapeutic Interventions/Progress Updates:    Makeup Session  Pt resting in w/c upon arrival and "ready for therapy." Pt engaged in BUE therex with focus on straight arm raises and rowing (forward and reverse) with 2# weight bar 8 X 3. Pt noted that her arms "really ached near the end of each set." Pt required extended rest breaks between each set. Pt remained in w/c with all needs within reach.   Therapy Documentation Precautions:  Precautions Precautions: Fall, Other (comment) Precaution Comments: R AKA, L foot wounds Restrictions Weight Bearing Restrictions: Yes RLE Weight Bearing: Non weight bearing Other Position/Activity Restrictions: caution about trying to put weight through left LE due to wounds on forefoot   Pain: Pt c/o R hip discomfort seated in w/c; repositioned with relief reported See Function Navigator for Current Functional Status.   Therapy/Group: Individual Therapy  Leroy Libman 06/16/2018, 11:59 AM

## 2018-06-16 NOTE — Telephone Encounter (Signed)
resch appt spk to pt husband 07/14/18 330pm p/o MD

## 2018-06-16 NOTE — Progress Notes (Signed)
Occupational Therapy Session Note  Patient Details  Name: [name] MRN: [id number] Date of Birth: Dec 28, 1945  Today's Date: 06/16/2018 OT Individual Time: 1335-1405 OT Individual Time Calculation (min): 30 min    Short Term Goals: Week 1:  OT Short Term Goal 1 (Week 1): Pt will perform transfer to wide drop arm BSC with mod A  OT Short Term Goal 2 (Week 1): Pt will perform lateral leans with min A while pulling up pants OT Short Term Goal 3 (Week 1): Pt will don pants with mod A (2/3 steps) EOB OT Short Term Goal 4 (Week 1): Pt will setup w/c in prep for transfers with supervision with mod cues  Skilled Therapeutic Interventions/Progress Updates:    Pt presents sitting up in w/c agreeable to OT tx session. Pt with some c/o of pain in RLE and R hip, providing relief with repositioning and increasing LE support under RLE. Pt self propels w/c 25% distance to dayroom given increased time to perform, therapist providing assist for remaining distance for time management and energy conservation. In dayroom pt participating in arm bike with focus on UE strengthening/endurance. Pt propelling forward and backwards for approx 12 min total, taking intermittent seated rest breaks throughout. Pt returned to room in manner described above, propelling w/c approx 50% distance to her room given increased time/effort. Pt was left seated in w/c, call bell and needs within reach end of session.   Therapy Documentation Precautions:  Precautions Precautions: Fall, Other (comment) Precaution Comments: R AKA, L foot wounds Restrictions Weight Bearing Restrictions: Yes RLE Weight Bearing: Non weight bearing Other Position/Activity Restrictions: caution about trying to put weight through left LE due to wounds on forefoot    See Function Navigator for Current Functional Status.   Therapy/Group: Individual Therapy  Raymondo Band 06/16/2018, 9:51 AM

## 2018-06-16 NOTE — Progress Notes (Signed)
Orthopedic Tech Progress Note Patient Details:  [name] 26-Dec-1945 [id number]  Patient ID: [name], female   DOB: [birth year], 72 y.o.   MRN: [id number]   [name] 06/16/2018, 4:05 PM Called in [employer] brace order; spoke with [facility]

## 2018-06-17 ENCOUNTER — Inpatient Hospital Stay (HOSPITAL_COMMUNITY): Payer: Medicare Other | Admitting: Occupational Therapy

## 2018-06-17 ENCOUNTER — Inpatient Hospital Stay (HOSPITAL_COMMUNITY): Payer: Medicare Other | Admitting: Physical Therapy

## 2018-06-17 LAB — CBC
HEMATOCRIT: 30 % — AB (ref 36.0–46.0)
HEMOGLOBIN: 8.5 g/dL — AB (ref 12.0–15.0)
MCH: 26.8 pg (ref 26.0–34.0)
MCHC: 28.3 g/dL — ABNORMAL LOW (ref 30.0–36.0)
MCV: 94.6 fL (ref 78.0–100.0)
PLATELETS: 275 10*3/uL (ref 150–400)
RBC: 3.17 MIL/uL — AB (ref 3.87–5.11)
RDW: 17.5 % — ABNORMAL HIGH (ref 11.5–15.5)
WBC: 10.6 10*3/uL — AB (ref 4.0–10.5)

## 2018-06-17 LAB — RENAL FUNCTION PANEL
ANION GAP: 15 (ref 5–15)
Albumin: 1.8 g/dL — ABNORMAL LOW (ref 3.5–5.0)
BUN: 59 mg/dL — ABNORMAL HIGH (ref 8–23)
CHLORIDE: 91 mmol/L — AB (ref 98–111)
CO2: 23 mmol/L (ref 22–32)
Calcium: 8 mg/dL — ABNORMAL LOW (ref 8.9–10.3)
Creatinine, Ser: 4.7 mg/dL — ABNORMAL HIGH (ref 0.44–1.00)
GFR, EST AFRICAN AMERICAN: 10 mL/min — AB (ref >60)
GFR, EST NON AFRICAN AMERICAN: 8 mL/min — AB (ref >60)
Glucose, Bld: 161 mg/dL — ABNORMAL HIGH (ref 70–99)
POTASSIUM: 3.9 mmol/L (ref 3.5–5.1)
Phosphorus: 4.8 mg/dL — ABNORMAL HIGH (ref 2.5–4.6)
Sodium: 129 mmol/L — ABNORMAL LOW (ref 135–145)

## 2018-06-17 LAB — GLUCOSE, CAPILLARY
GLUCOSE-CAPILLARY: 165 mg/dL — AB (ref 70–99)
GLUCOSE-CAPILLARY: 71 mg/dL (ref 70–99)
Glucose-Capillary: 152 mg/dL — ABNORMAL HIGH (ref 70–99)
Glucose-Capillary: 125 mg/dL — ABNORMAL HIGH (ref 70–99)

## 2018-06-17 MED ORDER — INSULIN DETEMIR 100 UNIT/ML ~~LOC~~ SOLN
13.0000 [IU] | Freq: Every day | SUBCUTANEOUS | Status: DC
  Administered 2018-06-18 – 2018-06-21 (×4): 13 [IU] via SUBCUTANEOUS
  Filled 2018-06-17 (×5): qty 0.13

## 2018-06-17 MED ORDER — HEPARIN SODIUM (PORCINE) 1000 UNIT/ML DIALYSIS: 20.0000 [IU]/kg | INTRAMUSCULAR | Status: DC | PRN

## 2018-06-17 NOTE — Progress Notes (Signed)
  [facility] Progress Note   Subjective:  Seen in room. Feels well today. No edema, SOB.   Objective Vitals:   06/16/18 1327 06/16/18 1923 06/17/18 0446 06/17/18 0532  BP: (!) 142/48 (!) 128/50 (!) 157/48   Pulse: 77 75 83   Resp: 17 18 18    Temp: 98 F (36.7 C) 98.2 F (36.8 C) 98.4 F (36.9 C)   TempSrc: Oral  Oral   SpO2: 100% 100% 100%   Weight:    63.1 kg  Height:       Physical Exam General:Frail woman, NAD Heart:RRR; 2/6 systolic murmur Lungs:CTAB Extremities:No LLE edema (foot bandaged); R AKA with bandaged stump Dialysis Access:AVG + [facility]  Additional Objective Labs: Basic Metabolic Panel: Recent Labs  Lab 06/12/18 1900 06/15/18 1320  NA 131* 133*  K 4.3 3.7  CL 95* 95*  CO2 24 26  GLUCOSE 163* 143*  BUN 53* 68*  CREATININE 4.82* 5.28*  CALCIUM 8.0* 8.1*  PHOS 4.3 4.3   Liver Function Tests: Recent Labs  Lab 06/12/18 1900 06/15/18 1320  ALBUMIN 1.7* 1.7*   CBC: Recent Labs  Lab 06/12/18 1859 06/15/18 1320  WBC 10.9* 9.6  HGB 8.6* 8.5*  HCT 30.2* 29.7*  MCV 94.1 94.9  PLT 269 269   CBG: Recent Labs  Lab 06/16/18 0646 06/16/18 1137 06/16/18 1651 06/16/18 2107 06/17/18 0644  GLUCAP 162* 109* 149* 122* 165*   Medications:  . allopurinol  100 mg Oral Q M,W,F  . apixaban  5 mg Oral BID  . atorvastatin  20 mg Oral QHS  . brimonidine  1 drop Left Eye BID   And  . timolol  1 drop Left Eye BID  . Chlorhexidine Gluconate Cloth  6 each Topical Q0600  . darbepoetin (ARANESP) injection - DIALYSIS  200 mcg Intravenous Q Thu-HD  . dicyclomine  10 mg Oral TID AC & HS  . docusate sodium  100 mg Oral Daily  . feeding supplement (NEPRO CARB STEADY)  237 mL Oral TID WC  . feeding supplement (PRO-STAT SUGAR FREE 64)  30 mL Oral BID  . fentaNYL  50 mcg Transdermal Q72H  . hydroxychloroquine  200 mg Oral Daily  . insulin aspart  0-15 Units Subcutaneous TID WC  . [START ON 06/18/2018] insulin detemir  13 Units Subcutaneous  Daily  . latanoprost  1 drop Both Eyes QHS  . metoprolol tartrate  12.5 mg Oral 2 times per day on Sun Mon Wed Fri  . multivitamin  1 tablet Oral QHS  . pantoprazole  40 mg Oral Daily  . predniSONE  5 mg Oral Q breakfast    Dialysis Orders: TTS at [facility] 3.5h  69.5kg   2/2.5 bath  AVF  Hep 2000 - Venofer 50mg  IV weekly - Mircera 217mcg IV q 2 weeks (last given 9/5 - Hgb 7.4 at that time)  Assessment/Plan: 1. S/p R AKA 9/10 (non-healing wounds):on CIR - doing well with PT. 2. ESRD:Continue TTS schedule. HD today. Tight heparin. 3. Hypertension/volume:BPcontrolled, EDW will be loweredon d/c. 4. Anemia:Hgb8.5, continue Aranesp 267mcg q Thursday. 5. Metabolic bone disease:Labs ok.No VDRA. 6. Nutrition:Alb very low,continue Nepro and Pro-stat supps. 7. Type 2 DM: Per primary. 8. PVD, L foot wound:Per primary  Kelly Splinter MD [facility] pgr [phone]   06/17/2018, 10:43 AM

## 2018-06-17 NOTE — Progress Notes (Signed)
Occupational Therapy Session Note  Patient Details  Name: [name] MRN: [id number] Date of Birth: [birth year]  Today's Date: 06/17/2018 OT Individual Time: 5102-5852 OT Individual Time Calculation (min): 60 min    Short Term Goals: Week 1:  OT Short Term Goal 1 (Week 1): Pt will perform transfer to wide drop arm BSC with mod A  OT Short Term Goal 2 (Week 1): Pt will perform lateral leans with min A while pulling up pants OT Short Term Goal 3 (Week 1): Pt will don pants with mod A (2/3 steps) EOB OT Short Term Goal 4 (Week 1): Pt will setup w/c in prep for transfers with supervision with mod cues  Skilled Therapeutic Interventions/Progress Updates:    Pt seen for OT session focusing on ADL re-training and problem solving ADL routine for home. Pt in supine upon arrival, voiced some discomfort in R residual limb at surgical site, however, declined need for intervention. Cont with discussions regarding various DME and recommendation for positioning of ADL tasks at d/c. Pt has displayed increased safety and independence with ADLs from bed level, however, sleeps in recliner at home as bedroom not w/c accessible. Recommending use of hospital bed at home for ease, safety and independence with ADLs. Pt reports she believes hospital bed would fit in den, [employer], needs to check with husband. Pt declined bathing task this morning, completed dressing only from bed level with set-up/supervision and significantly increased time and rest breaks required throughout. She transferred to EOB with mod A and VCs for technique using hospital bed functions. Donned shirt seated EOB with supervision. Completed sliding board transfer to w/c, requiring significantly increased time, min-mod A with cuing for technique.  Completed UE strengthening exercises using orange level II theraband. Completed x10 chest expansion and diagonal up/ diagonal down with multimodal cuing for proper form and technique with rest  breaks required throughout.  Pt left seated in w/c at end of session, all needs in reach.   Therapy Documentation Precautions:  Precautions Precautions: Fall, Other (comment) Precaution Comments: R AKA, L foot wounds Restrictions Weight Bearing Restrictions: Yes RLE Weight Bearing: Non weight bearing Other Position/Activity Restrictions: caution about trying to put weight through left LE due to wounds on forefoot  See Function Navigator for Current Functional Status.   Therapy/Group: Individual Therapy  Emrik Erhard L 06/17/2018, 7:48 AM

## 2018-06-17 NOTE — Progress Notes (Signed)
[place] PHYSICAL MEDICINE & REHABILITATION     PROGRESS NOTE  Subjective/Complaints:  Patient seen sitting up in bed this morning.  She states he slept well overnight.  She has questions about prognosis.  She also asked me to help her situation her belongings.  ROS: Denies CP, SOB, N/V/D.  Objective: Vital Signs: Blood pressure (!) 157/48, pulse 83, temperature 98.4 F (36.9 C), temperature source Oral, resp. rate 18, height 5\' 4"  (1.626 m), weight 63.1 kg, SpO2 100 %. No results found. Recent Labs    06/15/18 1320  WBC 9.6  HGB 8.5*  HCT 29.7*  PLT 269   Recent Labs    06/15/18 1320  NA 133*  K 3.7  CL 95*  GLUCOSE 143*  BUN 68*  CREATININE 5.28*  CALCIUM 8.1*   CBG (last 3)  Recent Labs    06/16/18 1651 06/16/18 2107 06/17/18 0644  GLUCAP 149* 122* 165*    Wt Readings from Last 3 Encounters:  06/17/18 63.1 kg  06/10/18 65.4 kg  06/04/18 79.4 kg    Physical Exam:  BP (!) 157/48 (BP Location: Right Arm)   Pulse 83   Temp 98.4 F (36.9 C) (Oral)   Resp 18   Ht 5\' 4"  (1.626 m)   Wt 63.1 kg   SpO2 100%   BMI 23.88 kg/m  Constitutional: She has sickly appearance. NAD. HENT: normocephalic. Atraumatic Eyes: disconjugate gaze Cardiac: RRR.  No JVD. Lungs: Clear.  Unlabored. Abdomen: Positive bowel sounds, nondistended Musculoskeletal: Right AKA with edema, stable Neurologic: Alert. Motor:  4/5 left hip flexors, knee extensors, ankle dorsiflexor, stable 4+/5 right hip flexion, stable Skin: Right AKA with dressing C/D/I Left lower extremity dressing c/d/i  Assessment/Plan: 1. Functional deficits secondary to right AKA with left lower extremity dry gangrene which require 3+ hours per day of interdisciplinary therapy in a comprehensive inpatient rehab setting. Physiatrist is providing close team supervision and 24 hour management of active medical problems listed below. Physiatrist and rehab team continue to assess barriers to discharge/monitor  patient progress toward functional and medical goals.  Function:  Bathing Bathing position   Position: Sitting EOB  Bathing parts Body parts bathed by patient: Right arm, Left arm, Chest, Abdomen, Right upper leg, Left upper leg Body parts bathed by helper: Front perineal area, Buttocks, Back  Bathing assist Assist Level: Set up   Set up : To obtain items  Upper Body Dressing/Undressing Upper body dressing   What is the patient wearing?: Pull over shirt/dress, Bra Bra - Perfomed by patient: Thread/unthread right bra strap, Thread/unthread left bra strap Bra - Perfomed by helper: Hook/unhook bra (pull down sports bra) Pull over shirt/dress - Perfomed by patient: Thread/unthread right sleeve, Thread/unthread left sleeve, Put head through opening, Pull shirt over trunk          Upper body assist Assist Level: Supervision or verbal cues      Lower Body Dressing/Undressing Lower body dressing   What is the patient wearing?: Pants     Pants- Performed by patient: Thread/unthread right pants leg, Thread/unthread left pants leg Pants- Performed by helper: Pull pants up/down                      Lower body assist Assist for lower body dressing: Touching or steadying assistance (Pt > 75%)      Toileting Toileting     Toileting steps completed by helper: Performs perineal hygiene, Adjust clothing after toileting    Toileting assist Assist  level: Supervision or verbal cues   Transfers Chair/bed transfer   Chair/bed transfer method: Lateral scoot Chair/bed transfer assist level: Moderate assist (Pt 50 - 74%/lift or lower) Chair/bed transfer assistive device: Armrests, Sliding board     Locomotion Ambulation Ambulation activity did not occur: Safety/medical concerns         Wheelchair   Type: Manual Max wheelchair distance: 50 Assist Level: Supervision or verbal cues  Cognition Comprehension Comprehension assist level: Follows basic conversation/direction with  extra time/assistive device  Expression Expression assist level: Expresses basic needs/ideas: With extra time/assistive device  Social Interaction Social Interaction assist level: Interacts appropriately 90% of the time - Needs monitoring or encouragement for participation or interaction.  Problem Solving Problem solving assist level: Solves basic 90% of the time/requires cueing < 10% of the time  Memory Memory assist level: Recognizes or recalls 90% of the time/requires cueing < 10% of the time    Medical Problem List and Plan: 1. Decline in functional mobility and ADLs  secondary to Right Above knee amputation  Cont CIR  Stump shrinker ordered 2.H/oDVT/ A fib/anticoagulation:Pharmaceutical:Other (comment)--Eliquis. 3.ChronicPain Management:On fentanyl 50 mcg (100 mcg PTA?) with prn oxycodone.  4. Mood:LCSW to follow for evaluation and support. Seems to be accepting of thedecision forR-AKA.  5. Neuropsych: This patientis not fullycapable of making decisions onherown behalf. 6. Skin/Wound Care:Continue nephro/prostat supplements to promote healing. Monitor wounds for healing.   Patient will likely require left lower extremity amputation future 7. Fluids/Electrolytes/Nutrition:Daily weights with strict I/O. Labs with HD. 8. T2DM with neuropathy and retionpathy, coundounded by steroids:   Was on levemir 10 units daily--resumed 5 units daily, increased to 10 units on 9/16, increased to 13 units on 9/20  Monitor BS ac/hs. Continue SSI for tighter control.  9. PVD with left foot wound: Question due to calciphylaxis v/s pyoderma gangrenosum.  10. A fib/Aflutter:Monitor heart rate twice daily. On Eliquis and beta-blocker twice daily 11. SLE: Continue plaquenil and low dose prednisone.  12. ESRD: Schedule HD on TTS at end of day to help with tolerance of therapy.  13. Anemia of chronic disease:Arransep weekly started on 9/12.   Hemoglobin 8.5 on 9/17  Labs with HD  Continue  to monitor 14. H/o Glaucoma: Stable on Timpotic and Xalatan.  15. H/o gastritis/erosive gastropathy: Per EGD 6/26. Continue PPI. Serial H/H monitored with HD. 16. Elevated blood pressure  Slightly labile on 9/19 17. Leukocytosis: Resolved  WBCs 9.6 on 9/17  Afebrile  Cont to monitor  LOS (Days) 7 A FACE TO FACE EVALUATION WAS PERFORMED  Arvine Clayburn Lorie Phenix 06/17/2018 9:04 AM

## 2018-06-17 NOTE — Progress Notes (Signed)
Physical Therapy Session Note  Patient Details  Name: [name] MRN: [id number] Date of Birth: 1945-12-27  Today's Date: 06/17/2018 PT Individual Time: 1000-1100 PT Individual Time Calculation (min): 60 min   Short Term Goals: Week 1:  PT Short Term Goal 1 (Week 1): Pt will perform bed mobility with Supervision PT Short Term Goal 2 (Week 1): Pt will perform least restrictive transfer with min A PT Short Term Goal 3 (Week 1): Pt will propel w/c x 100 ft with Supervision  Skilled Therapeutic Interventions/Progress Updates:    Pt received sitting in w/c and agreeable to PT treatment. TotalA w/c transport to/from rehab gym at beginning/end of session for energy/time conservation. Pt demonstrated ability to prepare/set up for transfer to mat table with mod cueing: lock brakes, move leg rest, ask for block under LLE (higher surface required due to inability to reach ground), slide to edge of seat, move arm rest, WS to place slideboard. MinA slideboard transfer to mat table with increased time. LE strengthening exercises performed: bil SLR, L SAQ (regressed to SAQ from SLR due to extensor lag when performing L SLR), hooklying adduction squeezes, and sidelying hip flex/ext/abd. Pt able to reach neutral hip ext but complains of "muscle spasms/tightness." R hip was passively stretched into hip extension to gain ROM (~5 deg ext) and prevent muscle cramps. With set up, pt was able to perform transfer with slideboard to w/c with S and increased time. Pt was sluggish during today's treatment and required regular verbal cues to stay alert and involved. Pt remained sitting in w/c with all needs within reach.   Therapy Documentation Precautions:  Precautions Precautions: Fall, Other (comment) Precaution Comments: R AKA, L foot wounds Restrictions Weight Bearing Restrictions: Yes RLE Weight Bearing: Non weight bearing Other Position/Activity Restrictions: caution about trying to put weight through  left LE due to wounds on forefoot  Pain: Pain Assessment Pain Scale: 0-10 Pain Score: 8  Faces Pain Scale: Hurts whole lot Pain Type: Surgical pain Pain Location: Leg Pain Orientation: Right Pain Descriptors / Indicators: Aching Pain Frequency: Intermittent Pain Onset: On-going Patients Stated Pain Goal: 2 Pain Intervention(s): Medication (See eMAR)   See Function Navigator for Current Functional Status.   Therapy/Group: Individual Therapy  [place] Ernestine Rohman, SPT 06/17/2018, 12:03 PM

## 2018-06-17 NOTE — Progress Notes (Signed)
Occupational Therapy Session Note  Patient Details  Name: [name] MRN: [id number] Date of Birth: 09-Jan-1946  Today's Date: 06/17/2018 OT Individual Time: 1130-1200 OT Individual Time Calculation (min): 30 min    Short Term Goals: Week 1:  OT Short Term Goal 1 (Week 1): Pt will perform transfer to wide drop arm BSC with mod A  OT Short Term Goal 2 (Week 1): Pt will perform lateral leans with min A while pulling up pants OT Short Term Goal 3 (Week 1): Pt will don pants with mod A (2/3 steps) EOB OT Short Term Goal 4 (Week 1): Pt will setup w/c in prep for transfers with supervision with mod cues Week 2:     Skilled Therapeutic Interventions/Progress Updates:    1:1 Focus on toileting and toilet transfers to the padded tub bench. Focus on slide board transfer with A for board placement and with min A. Pt able to perform lateral leans with supervision with instructional cues for process. Pt did require A to pull down pants and remove brief. Pt able to perform hygiene with setup after voiding small amt of urine. Pt able to perform lateral leans again for therapist to assist with pull up pants. Pt able to transfer back into w/c with min A via slide board.  Also new shrinker placed on residual right limb with total A.  Educated provided about care of shrinker and continual care for limb.    Therapy Documentation Precautions:  Precautions Precautions: Fall, Other (comment) Precaution Comments: R AKA, L foot wounds Restrictions Weight Bearing Restrictions: Yes RLE Weight Bearing: Non weight bearing Other Position/Activity Restrictions: caution about trying to put weight through left LE due to wounds on forefoot General:   Vital Signs: Therapy Vitals Temp: 98.9 F (37.2 C) Temp Source: Oral Pulse Rate: 82 Resp: 14 BP: (!) 143/70 Patient Position (if appropriate): Sitting Oxygen Therapy SpO2: 100 % O2 Device: Room Air Pain: Pain Assessment Pain Scale: 0-10 Pain Score:  8  Pain Type: Surgical pain Pain Location: Other (Comment)(AKA) Pain Orientation: Right Pain Intervention(s): Medication (See eMAR) ADL: ADL Eating: Set up Where Assessed-Eating: Wheelchair Grooming: Setup Where Assessed-Grooming: Sitting at sink Upper Body Bathing: Contact guard Where Assessed-Upper Body Bathing: Edge of bed Lower Body Bathing: Maximal assistance Upper Body Dressing: Setup Where Assessed-Upper Body Dressing: Edge of bed Lower Body Dressing: Maximal assistance Where Assessed-Lower Body Dressing: Edge of bed Toileting: Maximal assistance Where Assessed-Toileting: Bed level  See Function Navigator for Current Functional Status.   Therapy/Group: Individual Therapy  [facility] 06/17/2018, 8:57 PM

## 2018-06-17 NOTE — Patient Care Conference (Signed)
Inpatient RehabilitationTeam Conference and Plan of Care Update Date: 06/16/2018   Time: 2:20 PM    Patient Name: [name]      Medical Record Number: [id number]  Date of Birth: 1946/02/14 Sex: Female         Room/Bed: 4M02C/4M02C-01 Payor Info: Payor: [employer] MEDICARE / Plan: UHC MEDICARE / Product Type: *No Product type* /    Admitting Diagnosis: R AKA  Admit Date/Time:  06/10/2018  7:00 PM Admission Comments: No comment available   Primary Diagnosis:  <principal problem not specified> Principal Problem: <principal problem not specified>  Patient Active Problem List   Diagnosis Date Noted  . ESRD on dialysis ([place])   . Steroid-induced hyperglycemia   . Elevated blood pressure reading   . Type II diabetes mellitus with peripheral autonomic neuropathy (H[facility])   . Anemia of chronic disease   . Prediabetes   . Postoperative pain   . Ischemic leg pain   . Unilateral AKA, right ([place])   . Post-operative pain   . Diabetic retinopathy associated with type 2 diabetes mellitus ([place])   . Diabetic nephropathy associated with type 2 diabetes mellitus ([place])   . Leukocytosis   . Non-healing wound of lower extremity 06/08/2018  . Malnutrition of moderate degree 03/03/2018  . Leg wound, right 03/01/2018  . Ulcer of right calf ([name]) 02/10/2018  . Chronic RLQ pain 01/11/2018  . Ischemic ulcer of toes on both feet ([place])   . Cellulitis in diabetic foot ([place]) 12/14/2017  . Atrial fibrillation ([place]) 12/14/2017  . Glaucoma 12/14/2017  . ESRD (end stage renal disease) on dialysis ([place]) 11/16/2017  . COPD with acute exacerbation ([place]) 11/03/2017  . Community acquired pneumonia 11/02/2017  . Nausea and vomiting 11/02/2017  . Iron deficiency anemia 08/07/2017  . Heme positive stool 10/04/2015  . Mixed hyperlipidemia 06/29/2015  . Type 2 diabetes mellitus with stage 4 chronic kidney disease, with long-term current use of insulin ([place]) 06/29/2015  . Chronic kidney disease, stage V  ([place]) 04/12/2014  . 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 Acute respiratory failure with hypoxia ([place]) 03/15/2014  . Chronic diastolic heart failure ([place]) 03/15/2014  . Anemia of chronic renal failure, stage 4 (severe) ([place]) 02/23/2014  . Folate deficiency 02/17/2014  . Aortic regurgitation 09/19/2012  . Class 2 severe obesity due to excess calories with serious comorbidity and body mass index (BMI) of 35.0 to 35.9 in adult ([place]) 09/14/2012  . Type 2 diabetes mellitus with diabetic neuropathy ([place]) 09/14/2012  . SLE (systemic lupus erythematosus) ([place]) 09/14/2012  . Essential hypertension, benign 09/14/2012  . COPD (chronic obstructive pulmonary disease) ([place]) 09/14/2012    Expected Discharge Date: Expected Discharge Date: 06/24/18  Team Members Present: Physician leading conference: Dr. Delice Lesch Social Worker Present: Ovidio Kin, LCSW Nurse Present: Rayetta Humphrey, RN PT Present: Kem Parkinson, PT OT Present: Napoleon Form, OT SLP Present: Windell Moulding, SLP PPS Coordinator present : Daiva Nakayama, RN, CRRN     Current Status/Progress Goal Weekly Team Focus  Medical   Decline in functional mobility and ADLs  secondary to Right Above knee amputation  Improve pain, mobility, transfers, DM, gangrene left foot  See above   Bowel/Bladder   Continent of bowel/bladderm ESRD w/ HD Tuesday/Thursdat/Saturday, Oliguria, LBM 06/12/18 po Prune juice administer per request,   Maintain bladder/bowel pattern  Assess QS and prn ,    Swallow/Nutrition/ Hydration             ADL's  ADLs at EOB with lateral leans, bed mobility with bed rails supervision, slide board transfers with min to mod A with extra time, toileting in bed and using a bed pan currently  overall supervision   transitioning voiding and toileting on padded tub bench, transfers, functional mobility at w/c level    Mobility   modA bed mobility, modA slideboard transfers, S  w/c propulsion  bed <>recliner and w/c <>car with minA, S w/c propulsion; bed mobility/transfer goal d/c as pt sleeps in recliner  transfer training, generalized strengthening, amputee education ongoing   Communication             Safety/Cognition/ Behavioral Observations            Pain   Pain score 8/10 s/p Right AKA, with left foot necrosis, PRN Percocet 5-325mg  Q6 hrs and Tyllenol 325-650mg  Q 4 hrs pain constant and remain at 5-6/10 on pain scale after prn medication  < 3  Assess pain qs and prn medicate and evaluate,   Skin   s/p AKA surgical wound w/ surgical dressing change x1 and ace wrap, left foot dressing with Kerlix dressing change,Still Need orders for dressing changes   Prevent infection   Assess QS and monitor surgical and non surgical sites,       *See Care Plan and progress notes for long and short-term goals.     Barriers to Discharge  Current Status/Progress Possible Resolutions Date Resolved   Physician    Medical stability;Wound Care;Weight bearing restrictions     See above  Therapies, optimize DM meds, monitor wounds      Nursing                  PT                    OT                  SLP                SW                Discharge Planning/Teaching Needs:  HOme with husband who can and was providing carwe prior to admission and niece assisitng with ADL's prior to admission.      Team Discussion:  Goals supervision-min assist wheelchair level. Downgraded goals to min overall. Chronic pain meds and adjusting DM meds. Stump shrinker ordered. monitoring her foot for changes. Husband assisted prior to admission along with niece for ADL's. Progressing in therapies.  Revisions to Treatment Plan:  DC 9/26    Continued Need for Acute Rehabilitation Level of Care: The patient requires daily medical management by a physician with specialized training in physical medicine and rehabilitation for the following conditions: Daily direction of a multidisciplinary  physical rehabilitation program to ensure safe treatment while eliciting the highest outcome that is of practical value to the patient.: Yes Daily medical management of patient stability for increased activity during participation in an intensive rehabilitation regime.: Yes Daily analysis of laboratory values and/or radiology reports with any subsequent need for medication adjustment of medical intervention for : Diabetes problems;Blood pressure problems;Wound care problems;Post surgical problems;Other   I attest that I was present, lead the team conference, and concur with the assessment and plan of the team.   Elease Hashimoto 06/17/2018, 8:25 AM

## 2018-06-17 NOTE — Progress Notes (Signed)
Social Work Patient ID: [name], female   DOB: [birth year], 72 y.o.   MRN: [id number]  Met with pt and spoke with husband via telephone to discuss team conference goals-min assist level and target discharge 9/26. Discussed equipment needs and follow up. Pt was receiving services from [facility] prior to admission. Will contact and resume RN services and add therapies. Work toward discharge next week. Husband to come in next week prior to pt's discharge for therapies.

## 2018-06-17 NOTE — Progress Notes (Signed)
Resting throughout shift, cooperative and pleasant,continue to verbalize discomfort of rt leg stump surgical pain and left foot pain rating pain 8/10 on pain scale , medication prn provided for pain and discomfort, reassurance continue. Anticipate dialysis today,Sleep chart monitoring indicating the majority of shift patient was asleep and arousal w/o apparent distress. Refer to assessment shift data, call bell within reach, bed alarm on.

## 2018-06-18 ENCOUNTER — Inpatient Hospital Stay (HOSPITAL_COMMUNITY): Payer: Medicare Other | Admitting: Physical Therapy

## 2018-06-18 ENCOUNTER — Encounter: Payer: Medicare Other | Admitting: Adult Health

## 2018-06-18 ENCOUNTER — Inpatient Hospital Stay (HOSPITAL_COMMUNITY): Payer: Medicare Other

## 2018-06-18 ENCOUNTER — Inpatient Hospital Stay (HOSPITAL_COMMUNITY): Payer: Medicare Other | Admitting: Occupational Therapy

## 2018-06-18 LAB — GLUCOSE, CAPILLARY
Glucose-Capillary: 128 mg/dL — ABNORMAL HIGH (ref 70–99)
Glucose-Capillary: 83 mg/dL (ref 70–99)
Glucose-Capillary: 74 mg/dL (ref 70–99)
Glucose-Capillary: 139 mg/dL — ABNORMAL HIGH (ref 70–99)

## 2018-06-18 NOTE — Progress Notes (Signed)
Physical Therapy Weekly Progress Note  Patient Details  Name: [name] MRN: [id number] Date of Birth: [birth year]  Beginning of progress report period: June 12, 2019 End of progress report period: June 18, 2018  Today's Date: 06/18/2018 PT Individual Time: 1100-1155 PT Individual Time Calculation (min): 55 min   Patient has met 3 of 3 short term goals.  Patient currently requires xxx for bed mobility, variable S>modA for slideboard transfers depending on transfer surface; able to perform level transfers with S and increased time, however requires modA for uphill transfers including w/c <>recliner which has been a major focus of therapy sessions as pt reports sleeping in a recliner at home. Standing activities have not been pursued due to wounds on L foot and likelihood that standing/gait will not be functional or safe in the near future. Will require family education session prior to d/c to train family in [place] level transfers.  Patient continues to demonstrate the following deficits muscle weakness, decreased cardiorespiratoy endurance and decreased sitting balance and decreased balance strategies and therefore will continue to benefit from skilled PT intervention to increase functional independence with mobility.  Patient progressing toward long term goals..  Continue plan of care.  PT Short Term Goals Week 1:  PT Short Term Goal 1 (Week 1): Pt will perform bed mobility with Supervision PT Short Term Goal 2 (Week 1): Pt will perform least restrictive transfer with min A PT Short Term Goal 2 - Progress (Week 1): Met PT Short Term Goal 3 (Week 1): Pt will propel w/c x 100 ft with Supervision PT Short Term Goal 3 - Progress (Week 1): Met Week 2:  PT Short Term Goal 1 (Week 2): = LTG due to estimated LOS  Skilled Therapeutic Interventions/Progress Updates: Pt received seated in w/c, c/o pain as below in R residual limb, agreeable to treatment. W/c propulsion BUE x100' with  S; cues for hand placement to increase efficiency. Transfer w/c <>mat table with slight downhill to mat, uphill from mat with S overall. Moderate question cues for w/c setup and setupA for board placement. Sit <>supine with S, increased time and cues for technique and problem solving. R residual limb shrinker adjusted for better fit. Performed car transfer with transfer board and min guard, therapist stabilized board throughout transfer for safety. Discussed with OT plan to proceed with transfer training without step stool to reduce WB through residual limb with wounds as well as train transfers without use of L foot to prepare pt in the event of L foot amputation. Left note for pt/husband to discuss possibility of a hospital bed at d/c, and to alert staff to a day/time early next week to practice a real car transfer. Remained in w/c at end of session, all needs in reach.      Therapy Documentation Precautions:  Precautions Precautions: Fall, Other (comment) Precaution Comments: R AKA, L foot wounds Restrictions Weight Bearing Restrictions: Yes RLE Weight Bearing: Non weight bearing Other Position/Activity Restrictions: caution about trying to put weight through left LE due to wounds on forefoot Pain: Pain Assessment Pain Scale: 0-10 Pain Score: 0-No pain   See Function Navigator for Current Functional Status.  Therapy/Group: Individual Therapy  [name] 06/18/2018, 11:58 AM

## 2018-06-18 NOTE — Progress Notes (Signed)
Social Work Patient ID: [name], female   DOB: 28-May-1946, 72 y.o.   MRN: [id number]  Met with pt and husband to discuss the need for a hospital bed which Amy-OT recommends. Both do not want one and pt feels she is more comfortable with her recliner and plans to use this at home. Discussed if has BSC and she does not sure when she got one. This worker will look into this and see if it has been five years or more. Husband to come in Monday @ 1:00 for education with his actual car. Have let team know about this.

## 2018-06-18 NOTE — Progress Notes (Signed)
[facility] PHYSICAL MEDICINE & REHABILITATION     PROGRESS NOTE  Subjective/Complaints:  Patient seen sitting up in bed this morning.  She states she slept well overnight.  ROS: Denies CP, SOB, N/V/D.  Objective: Vital Signs: Blood pressure 134/63, pulse 79, temperature 98.7 F (37.1 C), temperature source Oral, resp. rate 18, height 5\' 4"  (1.626 m), weight 64.5 kg, SpO2 100 %. No results found. Recent Labs    06/15/18 1320 06/17/18 1403  WBC 9.6 10.6*  HGB 8.5* 8.5*  HCT 29.7* 30.0*  PLT 269 275   Recent Labs    06/15/18 1320 06/17/18 1403  NA 133* 129*  K 3.7 3.9  CL 95* 91*  GLUCOSE 143* 161*  BUN 68* 59*  CREATININE 5.28* 4.70*  CALCIUM 8.1* 8.0*   CBG (last 3)  Recent Labs    06/17/18 1819 06/17/18 2114 06/18/18 0638  GLUCAP 152* 125* 128*    Wt Readings from Last 3 Encounters:  06/18/18 64.5 kg  06/10/18 65.4 kg  06/04/18 79.4 kg    Physical Exam:  BP 134/63 (BP Location: Right Arm)   Pulse 79   Temp 98.7 F (37.1 C) (Oral)   Resp 18   Ht 5\' 4"  (1.626 m)   Wt 64.5 kg   SpO2 100%   BMI 24.41 kg/m  Constitutional: She has sickly appearance. NAD. HENT: normocephalic. Atraumatic Eyes: disconjugate gaze Cardiac: RRR.  No JVD. Lungs: Clear.  Unlabored. Abdomen: Positive bowel sounds, nondistended Musculoskeletal: Right AKA Neurologic: Alert. Motor:  4/5 left hip flexors, knee extensors, ankle dorsiflexor, unchanged 4+/5 right hip flexion, unchanged Skin: Right AKA with dressing C/D/I Left lower extremity dressing c/d/i  Assessment/Plan: 1. Functional deficits secondary to right AKA with left lower extremity dry gangrene which require 3+ hours per day of interdisciplinary therapy in a comprehensive inpatient rehab setting. Physiatrist is providing close team supervision and 24 hour management of active medical problems listed below. Physiatrist and rehab team continue to assess barriers to discharge/monitor patient progress toward functional  and medical goals.  Function:  Bathing Bathing position   Position: Sitting EOB  Bathing parts Body parts bathed by patient: Right arm, Left arm, Chest, Abdomen, Right upper leg, Left upper leg Body parts bathed by helper: Front perineal area, Buttocks, Back  Bathing assist Assist Level: Set up   Set up : To obtain items  Upper Body Dressing/Undressing Upper body dressing   What is the patient wearing?: Pull over shirt/dress Bra - Perfomed by patient: Thread/unthread right bra strap, Thread/unthread left bra strap Bra - Perfomed by helper: Hook/unhook bra (pull down sports bra) Pull over shirt/dress - Perfomed by patient: Thread/unthread right sleeve, Thread/unthread left sleeve, Put head through opening, Pull shirt over trunk          Upper body assist Assist Level: Supervision or verbal cues      Lower Body Dressing/Undressing Lower body dressing   What is the patient wearing?: Pants     Pants- Performed by patient: Thread/unthread right pants leg, Thread/unthread left pants leg, Pull pants up/down Pants- Performed by helper: Pull pants up/down                      Lower body assist Assist for lower body dressing: Supervision or verbal cues      Toileting Toileting     Toileting steps completed by helper: Performs perineal hygiene, Adjust clothing after toileting    Toileting assist Assist level: Supervision or verbal cues   Transfers  Chair/bed transfer   Chair/bed transfer method: Lateral scoot Chair/bed transfer assist level: Moderate assist (Pt 50 - 74%/lift or lower) Chair/bed transfer assistive device: Armrests, Sliding board     Locomotion Ambulation Ambulation activity did not occur: Safety/medical concerns         Wheelchair   Type: Manual Max wheelchair distance: 50 Assist Level: Supervision or verbal cues  Cognition Comprehension Comprehension assist level: Follows basic conversation/direction with extra time/assistive device   Expression Expression assist level: Expresses basic needs/ideas: With extra time/assistive device  Social Interaction Social Interaction assist level: Interacts appropriately 90% of the time - Needs monitoring or encouragement for participation or interaction.  Problem Solving Problem solving assist level: Solves basic 90% of the time/requires cueing < 10% of the time  Memory Memory assist level: Recognizes or recalls 90% of the time/requires cueing < 10% of the time    Medical Problem List and Plan: 1. Decline in functional mobility and ADLs  secondary to Right Above knee amputation  Cont CIR  Stump shrinker ordered 2.H/oDVT/ A fib/anticoagulation:Pharmaceutical:Other (comment)--Eliquis. 3.ChronicPain Management:On fentanyl 50 mcg (100 mcg PTA?) with prn oxycodone.  4. Mood:LCSW to follow for evaluation and support. Seems to be accepting of thedecision forR-AKA.  5. Neuropsych: This patientis not fullycapable of making decisions onherown behalf. 6. Skin/Wound Care:Continue nephro/prostat supplements to promote healing. Monitor wounds for healing.   Patient will likely require left lower extremity amputation future 7. Fluids/Electrolytes/Nutrition:Daily weights with strict I/O. Labs with HD. 8. T2DM with neuropathy and retionpathy, coundounded by steroids:   Was on levemir 10 units daily--resumed 5 units daily, increased to 10 units on 9/16, increased to 13 units on 9/20  Monitor BS ac/hs. Continue SSI for tighter control.  9. PVD with left foot wound: Question due to calciphylaxis v/s pyoderma gangrenosum.  10. A fib/Aflutter:Monitor heart rate twice daily. On Eliquis and beta-blocker twice daily 11. SLE: Continue plaquenil and low dose prednisone.  12. ESRD: Schedule HD on TTS at end of day to help with tolerance of therapy.  13. Anemia of chronic disease:Arransep weekly started on 9/12.   Hemoglobin 8.5 on 9/19  Labs with HD  Continue to monitor 14. H/o  Glaucoma: Stable on Timpotic and Xalatan.  15. H/o gastritis/erosive gastropathy: Per EGD 6/26. Continue PPI. Serial H/H monitored with HD. 16. Elevated blood pressure  Slightly labile on 9/20 17. Leukocytosis:   WBCs 10.6 on 9/19  Afebrile  Cont to monitor  LOS (Days) 8 A FACE TO FACE EVALUATION WAS PERFORMED  Ankit Lorie Phenix 06/18/2018 7:57 AM

## 2018-06-18 NOTE — Progress Notes (Signed)
Occupational Therapy Weekly Progress Note  Patient Details  Name: [name] MRN: [id number] Date of Birth: 06-16-1946  Beginning of progress report period: June 11, 2018 End of progress report period: June 18, 2018  Today's Date: 06/18/2018 OT Individual Time: 1415-1500 OT Individual Time Calculation (min): 45 min    Patient has met 4 of 4 short term goals.  Pt is making slow but steady progress towards OT goals. She cont to be most limited by decreased functional activity tolerance, requiring significantly increased time and rest breaks throughout all ADL tasks.  Significant time spent practicing and problem solving toileting, dressing and bathing routine for d/c. At this time, pt plans to complete bathing/dressing routine from padded tub bench with cut out.  Completing toileting on padded tub bench with cut out, transferring  Via sliding board. [name] has been present for one OT session in order to assist with problem solving and education provided regarding pt's CLOF and recommendations. He will benefit from cont education and hands on training prior to d/c home next week.   Patient continues to demonstrate the following deficits:acute pain and muscle weakness (generalized) and therefore will continue to benefit from skilled OT intervention to enhance overall performance with BADL and Reduce care partner burden.  Patient not progressing toward long term goals.  See goal revision..  Plan of care revisions: Toileting goal downgraded to max A. Toilet transfer goal modified to min A via slide board.  OT Short Term Goals Week 1:  OT Short Term Goal 1 (Week 1): Pt will perform transfer to wide drop arm BSC with mod A  OT Short Term Goal 1 - Progress (Week 1): Met OT Short Term Goal 2 (Week 1): Pt will perform lateral leans with min A while pulling up pants OT Short Term Goal 2 - Progress (Week 1): Met OT Short Term Goal 3 (Week 1): Pt will don pants with mod A (2/3  steps) EOB OT Short Term Goal 3 - Progress (Week 1): Met OT Short Term Goal 4 (Week 1): Pt will setup w/c in prep for transfers with supervision with mod cues OT Short Term Goal 4 - Progress (Week 1): Met Week 2:  OT Short Term Goal 1 (Week 2): STG=LTG due to LOS  Skilled Therapeutic Interventions/Progress Updates:    Pt seen for OT session focusing on functional transfers and toileting task. Pt sitting up in w/c upon arrival with husband present, pt denying pain and agreeable to tx session.  Pt completed sliding board transfers, w/c <> padded tub bench with cut out and w/c>EOB with overall min A with mod cuing for hand palcement and head/hip relationship. Pt completed lateral leans for clothing management, able to maintain balance in side position while therapist assisted with clothing management prior to and after void. Pt able to complete hygiene.  She returned to bed at end of session, left with all needs in reach, husband present and bed alarm on.  Pt and husband reporting they are not getting hospital bed for use at d/c despite recommendation. Discussed bathing/dressing routine from padded tub bench. Will cont with education.   Therapy Documentation Precautions:  Precautions Precautions: Fall, Other (comment) Precaution Comments: R AKA, L foot wounds Restrictions Weight Bearing Restrictions: (P) Yes RLE Weight Bearing: Non weight bearing Other Position/Activity Restrictions: caution about trying to put weight through left LE due to wounds on forefoot  See Function Navigator for Current Functional Status.   Therapy/Group: Individual Therapy  Marquese Burkland L 06/18/2018,  6:57 AM

## 2018-06-18 NOTE — Progress Notes (Addendum)
[name] Progress Note   Subjective:  Seen in room, therapy going well. No new complaints today.  Objective Vitals:   06/17/18 1944 06/18/18 0433 06/18/18 0600 06/18/18 0922  BP: (!) 143/70 134/63  136/64  Pulse: 82 79  82  Resp: 14 18    Temp: 98.9 F (37.2 C) 98.7 F (37.1 C)    TempSrc: Oral Oral    SpO2: 100% 100%    Weight:   64.5 kg   Height:       Physical Exam General:Frail woman, NAD Heart:RRR; 2/6 systolic murmur Lungs:CTAB Extremities:No LLE edema (foot bandaged); R AKA with bandaged stump Dialysis Access:AVF + [facility]  Additional Objective Labs: Basic Metabolic Panel: Recent Labs  Lab 06/12/18 1900 06/15/18 1320 06/17/18 1403  NA 131* 133* 129*  K 4.3 3.7 3.9  CL 95* 95* 91*  CO2 24 26 23   GLUCOSE 163* 143* 161*  BUN 53* 68* 59*  CREATININE 4.82* 5.28* 4.70*  CALCIUM 8.0* 8.1* 8.0*  PHOS 4.3 4.3 4.8*   Liver Function Tests: Recent Labs  Lab 06/12/18 1900 06/15/18 1320 06/17/18 1403  ALBUMIN 1.7* 1.7* 1.8*   CBC: Recent Labs  Lab 06/12/18 1859 06/15/18 1320 06/17/18 1403  WBC 10.9* 9.6 10.6*  HGB 8.6* 8.5* 8.5*  HCT 30.2* 29.7* 30.0*  MCV 94.1 94.9 94.6  PLT 269 269 275   CBG: Recent Labs  Lab 06/17/18 0644 06/17/18 1206 06/17/18 1819 06/17/18 2114 06/18/18 0638  GLUCAP 165* 71 152* 125* 128*   Medications:  . allopurinol  100 mg Oral Q M,W,F  . apixaban  5 mg Oral BID  . atorvastatin  20 mg Oral QHS  . brimonidine  1 drop Left Eye BID   And  . timolol  1 drop Left Eye BID  . Chlorhexidine Gluconate Cloth  6 each Topical Q0600  . darbepoetin (ARANESP) injection - DIALYSIS  200 mcg Intravenous Q Thu-HD  . dicyclomine  10 mg Oral TID AC & HS  . docusate sodium  100 mg Oral Daily  . feeding supplement (NEPRO CARB STEADY)  237 mL Oral TID WC  . feeding supplement (PRO-STAT SUGAR FREE 64)  30 mL Oral BID  . fentaNYL  50 mcg Transdermal Q72H  . hydroxychloroquine  200 mg Oral Daily  . insulin aspart   0-15 Units Subcutaneous TID WC  . insulin detemir  13 Units Subcutaneous Daily  . latanoprost  1 drop Both Eyes QHS  . metoprolol tartrate  12.5 mg Oral 2 times per day on Sun Mon Wed Fri  . multivitamin  1 tablet Oral QHS  . pantoprazole  40 mg Oral Daily  . predniSONE  5 mg Oral Q breakfast    Dialysis Orders: TTS at [facility] 3.5h  69.5kg   2/2.5 bath  AVF + TDC  Hep 2000 - Venofer 50mg  IV weekly - Mircera 236mcg IV q 2 weeks (last given 9/5 - Hgb 7.4 at that time)  Assessment/Plan: 1. S/p R AKA 9/10 (non-healing wounds):on CIR - continue to do well with PT/OT. 2. ESRD:Continue TTS schedule, next 9/21. 3. Hypertension/volume:BPcontrolled, EDW will be loweredon d/c. 4. Anemia:Hgb8.5, continue Aranesp 261mcg q Thursday. 5. Metabolic bone disease:Labs ok.No VDRA. 6. Nutrition:Alb very low,continue Nepro and Pro-stat supps. 7. Type 2 DM: Per primary. 8. PVD, L foot wound:Per primary  Veneta Penton, PA-C 06/18/2018, 10:25 AM  [facility] Pager: [phone]  Pt seen, examined and agree w A/P as above.  Kelly Splinter MD [employer]  pager [phone]   06/18/2018, 11:41 AM

## 2018-06-18 NOTE — Progress Notes (Signed)
Occupational Therapy Session Note  Patient Details  Name: [name] MRN: [id number] Date of Birth: [birth year]  Today's Date: 06/18/2018 OT Individual Time: 3818-4037 OT Individual Time Calculation (min): 72 min    Short Term Goals: Week 2:  OT Short Term Goal 1 (Week 2): STG=LTG due to LOS  Skilled Therapeutic Interventions/Progress Updates:    1;1, Pt completes peri care/nbathing in supine rolling B with supervision. PT completes UB dressing with supervision to clip bra in front and spin around. Pt dons pants EOB with lateral leans and min A for puling pants past hips completely. Pt slide board transer with A for board placement and min A for slide across board/VC for hand palcement. PT requires ~6 min to scoot EOB>w/c across slide board. Pt remain in w/c with call light inreach and all needs met.  Therapy Documentation Precautions:  Precautions Precautions: Fall, Other (comment) Precaution Comments: R AKA, L foot wounds Restrictions Weight Bearing Restrictions: (P) Yes RLE Weight Bearing: Non weight bearing Other Position/Activity Restrictions: caution about trying to put weight through left LE due to wounds on forefoot General:   Vital Signs: Therapy Vitals Pulse Rate: 82 BP: 136/64 Pain:   ADL:  See Function Navigator for Current Functional Status.   Therapy/Group: Individual Therapy  Tonny Branch 06/18/2018, 10:15 AM

## 2018-06-19 ENCOUNTER — Inpatient Hospital Stay (HOSPITAL_COMMUNITY): Payer: Medicare Other | Admitting: Physical Therapy

## 2018-06-19 ENCOUNTER — Inpatient Hospital Stay (HOSPITAL_COMMUNITY): Payer: Medicare Other | Admitting: Occupational Therapy

## 2018-06-19 LAB — RENAL FUNCTION PANEL
ANION GAP: 12 (ref 5–15)
Albumin: 1.8 g/dL — ABNORMAL LOW (ref 3.5–5.0)
BUN: 64 mg/dL — ABNORMAL HIGH (ref 8–23)
CALCIUM: 8.1 mg/dL — AB (ref 8.9–10.3)
CHLORIDE: 95 mmol/L — AB (ref 98–111)
CO2: 25 mmol/L (ref 22–32)
Creatinine, Ser: 4.59 mg/dL — ABNORMAL HIGH (ref 0.44–1.00)
GFR calc Af Amer: 10 mL/min — ABNORMAL LOW (ref >60)
GFR calc non Af Amer: 9 mL/min — ABNORMAL LOW (ref >60)
GLUCOSE: 102 mg/dL — AB (ref 70–99)
POTASSIUM: 4 mmol/L (ref 3.5–5.1)
Phosphorus: 4.2 mg/dL (ref 2.5–4.6)
SODIUM: 132 mmol/L — AB (ref 135–145)

## 2018-06-19 LAB — CBC
HEMATOCRIT: 29.6 % — AB (ref 36.0–46.0)
HEMOGLOBIN: 8.5 g/dL — AB (ref 12.0–15.0)
MCH: 26.9 pg (ref 26.0–34.0)
MCHC: 28.7 g/dL — AB (ref 30.0–36.0)
MCV: 93.7 fL (ref 78.0–100.0)
Platelets: 235 10*3/uL (ref 150–400)
RBC: 3.16 MIL/uL — ABNORMAL LOW (ref 3.87–5.11)
RDW: 17.1 % — ABNORMAL HIGH (ref 11.5–15.5)
WBC: 9.1 10*3/uL (ref 4.0–10.5)

## 2018-06-19 LAB — GLUCOSE, CAPILLARY
Glucose-Capillary: 103 mg/dL — ABNORMAL HIGH (ref 70–99)
Glucose-Capillary: 104 mg/dL — ABNORMAL HIGH (ref 70–99)
Glucose-Capillary: 76 mg/dL (ref 70–99)
Glucose-Capillary: 152 mg/dL — ABNORMAL HIGH (ref 70–99)

## 2018-06-19 MED ORDER — HEPARIN SODIUM (PORCINE) 1000 UNIT/ML DIALYSIS
20.0000 [IU]/kg | INTRAMUSCULAR | Status: DC | PRN
  Filled 2018-06-19: qty 2

## 2018-06-19 NOTE — Progress Notes (Signed)
[facility] PHYSICAL MEDICINE & REHABILITATION     PROGRESS NOTE  Subjective/Complaints:  RN reports HR of 147 , pt without symptoms, rechecked minutes later at 85bpm  ROS: Denies CP, SOB, N/V/D.  Objective: Vital Signs: Blood pressure (!) 127/48, pulse 89, temperature 98.7 F (37.1 C), resp. rate 16, height 5\' 4"  (1.626 m), weight 64.9 kg, SpO2 100 %. No results found. Recent Labs    06/17/18 1403  WBC 10.6*  HGB 8.5*  HCT 30.0*  PLT 275   Recent Labs    06/17/18 1403  NA 129*  K 3.9  CL 91*  GLUCOSE 161*  BUN 59*  CREATININE 4.70*  CALCIUM 8.0*   CBG (last 3)  Recent Labs    06/18/18 1206 06/18/18 1703 06/18/18 2113  GLUCAP 83 74 139*    Wt Readings from Last 3 Encounters:  06/19/18 64.9 kg  06/10/18 65.4 kg  06/04/18 79.4 kg    Physical Exam:  BP (!) 127/48 (BP Location: Right Arm)   Pulse 89   Temp 98.7 F (37.1 C)   Resp 16   Ht 5\' 4"  (1.626 m)   Wt 64.9 kg   SpO2 100%   BMI 24.56 kg/m  Constitutional: She has sickly appearance. NAD. HENT: normocephalic. Atraumatic Eyes: disconjugate gaze Cardiac: RRR.  No JVD. Lungs: Clear.  Unlabored. Abdomen: Positive bowel sounds, nondistended Musculoskeletal: Right AKA Neurologic: Alert. Motor:  4/5 left hip flexors, knee extensors, ankle dorsiflexor, unchanged 4+/5 right hip flexion, unchanged Skin: Right AKA with dressing C/D/I Left lower extremity dressing c/d/i  Assessment/Plan: 1. Functional deficits secondary to right AKA with left lower extremity dry gangrene which require 3+ hours per day of interdisciplinary therapy in a comprehensive inpatient rehab setting. Physiatrist is providing close team supervision and 24 hour management of active medical problems listed below. Physiatrist and rehab team continue to assess barriers to discharge/monitor patient progress toward functional and medical goals.  Function:  Bathing Bathing position   Position: Sitting EOB  Bathing parts Body parts  bathed by patient: Right arm, Left arm, Chest, Abdomen, Right upper leg, Left upper leg Body parts bathed by helper: Front perineal area, Buttocks, Back  Bathing assist Assist Level: Set up   Set up : To obtain items  Upper Body Dressing/Undressing Upper body dressing   What is the patient wearing?: Pull over shirt/dress Bra - Perfomed by patient: Thread/unthread right bra strap, Thread/unthread left bra strap Bra - Perfomed by helper: Hook/unhook bra (pull down sports bra) Pull over shirt/dress - Perfomed by patient: Thread/unthread right sleeve, Thread/unthread left sleeve, Put head through opening, Pull shirt over trunk          Upper body assist Assist Level: Supervision or verbal cues      Lower Body Dressing/Undressing Lower body dressing   What is the patient wearing?: Pants     Pants- Performed by patient: Thread/unthread right pants leg, Thread/unthread left pants leg, Pull pants up/down Pants- Performed by helper: Pull pants up/down                      Lower body assist Assist for lower body dressing: Supervision or verbal cues      Toileting Toileting   Toileting steps completed by patient: Adjust clothing prior to toileting, Performs perineal hygiene, Adjust clothing after toileting Toileting steps completed by helper: Performs perineal hygiene Toileting Assistive Devices: Grab bar or rail(walker)  Toileting assist Assist level: Supervision or verbal cues   Transfers Chair/bed transfer  Chair/bed transfer method: Lateral scoot Chair/bed transfer assist level: Touching or steadying assistance (Pt > 75%) Chair/bed transfer assistive device: Armrests, Sliding board     Locomotion Ambulation Ambulation activity did not occur: Safety/medical concerns         Wheelchair   Type: Manual Max wheelchair distance: 100 Assist Level: Supervision or verbal cues  Cognition Comprehension Comprehension assist level: Follows basic conversation/direction with  extra time/assistive device  Expression Expression assist level: Expresses basic needs/ideas: With extra time/assistive device  Social Interaction Social Interaction assist level: Interacts appropriately 90% of the time - Needs monitoring or encouragement for participation or interaction.  Problem Solving Problem solving assist level: Solves basic 90% of the time/requires cueing < 10% of the time  Memory Memory assist level: Recognizes or recalls 90% of the time/requires cueing < 10% of the time    Medical Problem List and Plan: 1. Decline in functional mobility and ADLs  secondary to Right Above knee amputation  Cont CIR  Stump shrinker ordered 2.H/oDVT/ A fib/anticoagulation:Pharmaceutical:Other (comment)--Eliquis. 3.ChronicPain Management:On fentanyl 50 mcg TD (100 mcg PTA?) with prn oxycodone.  4. Mood:LCSW to follow for evaluation and support. Seems to be accepting of thedecision forR-AKA.  5. Neuropsych: This patientis not fullycapable of making decisions onherown behalf. 6. Skin/Wound Care:Continue nephro/prostat supplements to promote healing. Monitor wounds for healing.   Patient will likely require left lower extremity amputation future 7. Fluids/Electrolytes/Nutrition:Daily weights with strict I/O. Labs with HD. 8. T2DM with neuropathy and retionpathy, coundounded by steroids:   Was on levemir 10 units daily--resumed 5 units daily, increased to 10 units on 9/16, increased to 13 units on 9/20   CBG (last 3)  Recent Labs    06/18/18 1206 06/18/18 1703 06/18/18 2113  GLUCAP 83 74 139*   9. PVD with left foot wound: Question due to calciphylaxis v/s pyoderma gangrenosum.  10. A fib/Aflutter:Monitor heart rate twice daily. On Eliquis and beta-blocker twice daily, may have had a run of RVR earlier today, will cont to monitor , no extra metoprolol today due to HD, if recurreent will ask cardiology to eval 11. SLE: Continue plaquenil and low dose prednisone.   12. ESRD: Schedule HD on TTS at end of day to help with tolerance of therapy.  13. Anemia of chronic disease:Arransep weekly started on 9/12.   Hemoglobin 8.5 on 9/19  Labs with HD  Continue to monitor 14. H/o Glaucoma: Stable on Timpotic and Xalatan.  15. H/o gastritis/erosive gastropathy: Per EGD 6/26. Continue PPI. Serial H/H monitored with HD. 16. Elevated blood pressure   labile on 9/21- lopressor is held on day of HD (tu, Th, Sa) Vitals:   06/19/18 0521 06/19/18 0654  BP: (!) 176/62 (!) 127/48  Pulse: 85 89  Resp: 20 16  Temp: 98.7 F (37.1 C)   SpO2: 99% 100%   17. Leukocytosis:   WBCs 10.6 on 9/19  Afebrile  Cont to monitor  LOS (Days) 9 A FACE TO FACE EVALUATION WAS PERFORMED  Charlett Blake 06/19/2018 6:55 AM

## 2018-06-19 NOTE — Progress Notes (Signed)
Occupational Therapy Session Note  Patient Details  Name: [name] MRN: [id number] Date of Birth: Oct 26, 1945  Today's Date: 06/19/2018 OT Individual Time: 0800-0930 OT Individual Time Calculation (min): 90 min    Short Term Goals: Week 2:  OT Short Term Goal 1 (Week 2): STG=LTG due to LOS  Skilled Therapeutic Interventions/Progress Updates:    Pt received in bed eating breakfast slowly and constantly moaning in pain, stating her R lower abdomen and R hip area were very painful.  Pt took over 35 min to complete breakfast, moaning frequently.  RN aware and brought her tylenol.  MD also aware.  Pt then stated she has been struggling with this pain for quite some time and has had MRIs and other tests with no conclusive diagnosis.   Pt felt she was in too much pain to transfer again to w/c. (RN reported that she had just done a BSC transfer and toileted at 745).  Pt declined bathing, stating "oh I have been washed up 2x already by nursing".   Pt dressed from bed with set up to don shirt and pants using rolling to don over hips.  Discussed use of a hospital bed. Pt continues to resist the idea of a hospital bed and wants to sleep in her recliner.  Suggested pt just give the bed a one month trial, if she finds her recliner transfers easy then she could send the hospital bed back. If she declines to have one set up at home, then she may find herself "stuck" if the recliner transfers are much harder than she anticipates.  Pt agreeable to working on theraband exercises from bed.  She worked on UE strengthening focusing on back, biceps and triceps. Pt complaining less of abdominal pain once she began the exercises. Pt tolerated exercises well. Pt resting in bed with alarm set and all needs met.   Therapy Documentation Precautions:  Precautions Precautions: Fall, Other (comment) Precaution Comments: R AKA, L foot wounds Restrictions Weight Bearing Restrictions: Yes RLE Weight Bearing: Non  weight bearing Other Position/Activity Restrictions: caution about trying to put weight through left LE due to wounds on forefoot   Vital Signs: Therapy Vitals Temp: 98.7 F (37.1 C) Pulse Rate: 89 Resp: 16 BP: (!) 127/48 Patient Position (if appropriate): Lying Oxygen Therapy SpO2: 100 % O2 Device: Room Air Pain: Pain Assessment Pain Scale: 0-10 Pain Score: 5  Pain Type: Acute pain;Surgical pain Pain Location: Leg Pain Orientation: Right Pain Descriptors / Indicators: Aching;Sharp Pain Frequency: Intermittent Pain Onset: Awakened from sleep Pain Intervention(s): Medication (See eMAR)   ADL: See Function Navigator for Current Functional Status.   Therapy/Group: Individual Therapy  Birdia Jaycox 06/19/2018, 8:12 AM

## 2018-06-19 NOTE — Progress Notes (Signed)
Physical Therapy Session Note  Patient Details  Name: [name] MRN: [id number] Date of Birth: 27-Oct-1945  Today's Date: 06/19/2018 PT Individual Time: 1100-1200 PT Individual Time Calculation (min): 60 min   Short Term Goals: Week 2:  PT Short Term Goal 1 (Week 2): = LTG due to estimated LOS  Skilled Therapeutic Interventions/Progress Updates:    Pt received supine in bed, reports 8/10 pain in R groin/hip area and ongoing pain in LLE, not rated. Supine to sit with Supervision and use of bedrails. Attempt sliding board transfer bed to w/c, pt has increase in LLE pain described as "throbbing" while sitting, declines to participate in transfer and OOB mobility secondary to pain. Sit to supine with Supervision. Supine BLE therex x 10 reps with ongoing encouragement throughout therapy session due to R groin/hip pain. Pt declines ice pack to area and reports she is able to have more pain medication at end of therapy session. Pt requests to use the bathroom, declines to get up to bedside commode due to ongoing pain. Encouraged pt to use commode as this will be more functional once she goes home, pt declines. Rolling L/R with Supervision and bedrails for bedpan placement. Pt requires assist for pericare and min A for clothing management. Pt left seated in bed set up for lunch at end of session, needs in reach and bed alarm in place.  Therapy Documentation Precautions:  Precautions Precautions: Fall, Other (comment) Precaution Comments: R AKA, L foot wounds Restrictions Weight Bearing Restrictions: Yes RLE Weight Bearing: Non weight bearing Other Position/Activity Restrictions: caution about trying to put weight through left LE due to wounds on forefoot  See Function Navigator for Current Functional Status.   Therapy/Group: Individual Therapy  Excell Seltzer, PT, DPT  06/19/2018, 12:04 PM

## 2018-06-19 NOTE — Progress Notes (Signed)
Pt's pulse rate up to 147-153/min, irregular with BP 127/48. Pt in bed and denies any dizziness, chest palpitation or pain. Elevated PR lasted for about 2 mins and went down to 80's without intervention. Dr. Letta Pate aware.

## 2018-06-19 NOTE — Progress Notes (Addendum)
[facility] Progress Note   Subjective: Awake, alert, C/O pain in R hip but says she will get pain meds later. No new complaints, says her therapy is going well.   Objective Vitals:   06/18/18 1355 06/18/18 1926 06/19/18 0521 06/19/18 0654  BP: 120/62 (!) 144/54 (!) 176/62 (!) 127/48  Pulse: 74 79 85 89  Resp: 19 18 20 16   Temp: 97.9 F (36.6 C) 98.1 F (36.7 C) 98.7 F (37.1 C)   TempSrc: Oral Oral    SpO2: 100% 100% 99% 100%  Weight:   64.9 kg   Height:       Physical Exam General: Elderly female in NAD Heart: Z6,X0, RRR 2/6 systolic M.  Lungs: CTAB A/P Abdomen: Active BS Extremities: No LLE edema-guarding toes on L foot. R AKA stump in wrap.  Dialysis Access: LUA AVF + bruit, not developing well. RIJ TDC drsg intact.   Dialysis Orders:  Additional Objective Labs: Basic Metabolic Panel: Recent Labs  Lab 06/12/18 1900 06/15/18 1320 06/17/18 1403  NA 131* 133* 129*  K 4.3 3.7 3.9  CL 95* 95* 91*  CO2 24 26 23   GLUCOSE 163* 143* 161*  BUN 53* 68* 59*  CREATININE 4.82* 5.28* 4.70*  CALCIUM 8.0* 8.1* 8.0*  PHOS 4.3 4.3 4.8*   Liver Function Tests: Recent Labs  Lab 06/12/18 1900 06/15/18 1320 06/17/18 1403  ALBUMIN 1.7* 1.7* 1.8*   No results for input(s): LIPASE, AMYLASE in the last 168 hours. CBC: Recent Labs  Lab 06/12/18 1859 06/15/18 1320 06/17/18 1403  WBC 10.9* 9.6 10.6*  HGB 8.6* 8.5* 8.5*  HCT 30.2* 29.7* 30.0*  MCV 94.1 94.9 94.6  PLT 269 269 275   Blood Culture    Component Value Date/Time   SDES BLOOD RIGHT ANTECUBITAL 01/16/2018 1800   SPECREQUEST  01/16/2018 1800    BOTTLES DRAWN AEROBIC AND ANAEROBIC Blood Culture adequate volume   CULT  01/16/2018 1800    NO GROWTH 5 DAYS Performed at [facility], [place] [address] [place], [place] [address]    REPTSTATUS 01/21/2018 FINAL 01/16/2018 1800    Cardiac Enzymes: No results for input(s): CKTOTAL, CKMB, CKMBINDEX, TROPONINI in the last 168  hours. CBG: Recent Labs  Lab 06/18/18 0638 06/18/18 1206 06/18/18 1703 06/18/18 2113 06/19/18 0706  GLUCAP 128* 83 74 139* 103*   Iron Studies: No results for input(s): IRON, TIBC, TRANSFERRIN, FERRITIN in the last 72 hours. @lablastinr3 @ Studies/Results: No results found. Medications:  . allopurinol  100 mg Oral Q M,W,F  . apixaban  5 mg Oral BID  . atorvastatin  20 mg Oral QHS  . brimonidine  1 drop Left Eye BID   And  . timolol  1 drop Left Eye BID  . Chlorhexidine Gluconate Cloth  6 each Topical Q0600  . darbepoetin (ARANESP) injection - DIALYSIS  200 mcg Intravenous Q Thu-HD  . dicyclomine  10 mg Oral TID AC & HS  . docusate sodium  100 mg Oral Daily  . feeding supplement (NEPRO CARB STEADY)  237 mL Oral TID WC  . feeding supplement (PRO-STAT SUGAR FREE 64)  30 mL Oral BID  . fentaNYL  50 mcg Transdermal Q72H  . hydroxychloroquine  200 mg Oral Daily  . insulin aspart  0-15 Units Subcutaneous TID WC  . insulin detemir  13 Units Subcutaneous Daily  . latanoprost  1 drop Both Eyes QHS  . metoprolol tartrate  12.5 mg Oral 2 times per day on Sun Mon Wed  Fri  . multivitamin  1 tablet Oral QHS  . pantoprazole  40 mg Oral Daily  . predniSONE  5 mg Oral Q breakfast     Dialysis Orders: TTS at [facility] 3.5h 69.5kg 2/2.5 bath AVF + TDC Hep 2000 - Venofer 50mg  IV weekly - Mircera 275mcg IV q 2 weeks (last given 9/5 - Hgb 7.4 at that time)  Assessment/Plan: 1. S/p R AKA 9/10(non-healingwounds):on CIR - continue to do well with PT/OT. 2. ESRD:Continue TTS schedule, next today. Check labs with HD today.  3. Hypertension/volume:BPcontrolled, EDW will be loweredon d/c. No excess volume on exam.  4. Anemia:Hgb8.5, continue Aranesp 285mcg q Thursday. 5. Metabolic bone disease:Ca 8.0 C Ca 9.8 Phos 4.8. No binders, VDRA yet.  6. Nutrition:Alb 1.8continue Nepro and Pro-stat supps. 7. Type 2 DM: Per primary. 8. PVD, L foot wound:Perprimary  Rita H.  Brown NP-C 06/19/2018, 10:13 AM  [facility] [phone]  Pt seen, examined and agree w A/P as above.  Kelly Splinter MD [employer] pager [phone]   06/19/2018, 11:38 AM

## 2018-06-20 ENCOUNTER — Inpatient Hospital Stay (HOSPITAL_COMMUNITY): Payer: Medicare Other | Admitting: Occupational Therapy

## 2018-06-20 ENCOUNTER — Inpatient Hospital Stay (HOSPITAL_COMMUNITY): Payer: Medicare Other

## 2018-06-20 LAB — GLUCOSE, CAPILLARY
GLUCOSE-CAPILLARY: 118 mg/dL — AB (ref 70–99)
GLUCOSE-CAPILLARY: 111 mg/dL — AB (ref 70–99)
Glucose-Capillary: 144 mg/dL — ABNORMAL HIGH (ref 70–99)
Glucose-Capillary: 118 mg/dL — ABNORMAL HIGH (ref 70–99)

## 2018-06-20 LAB — HEPATITIS B SURFACE ANTIGEN: Hepatitis B Surface Ag: NEGATIVE

## 2018-06-20 MED ORDER — DARBEPOETIN ALFA 200 MCG/0.4ML IJ SOSY
200.0000 ug | PREFILLED_SYRINGE | Freq: Once | INTRAMUSCULAR | Status: DC
  Filled 2018-06-20: qty 0.4

## 2018-06-20 NOTE — Progress Notes (Signed)
[place] PHYSICAL MEDICINE & REHABILITATION     PROGRESS NOTE  Subjective/Complaints:   CHronic Left foot and post op Right thigh pain No abd pain today  ROS: Denies CP, SOB, N/V/D.  Objective: Vital Signs: Blood pressure (!) 139/42, pulse 95, temperature 99.2 F (37.3 C), temperature source Oral, resp. rate 20, height 5\' 4"  (1.626 m), weight 63.2 kg, SpO2 100 %. No results found. Recent Labs    06/17/18 1403 06/19/18 1416  WBC 10.6* 9.1  HGB 8.5* 8.5*  HCT 30.0* 29.6*  PLT 275 235   Recent Labs    06/17/18 1403 06/19/18 1417  NA 129* 132*  K 3.9 4.0  CL 91* 95*  GLUCOSE 161* 102*  BUN 59* 64*  CREATININE 4.70* 4.59*  CALCIUM 8.0* 8.1*   CBG (last 3)  Recent Labs    06/19/18 1830 06/19/18 2145 06/20/18 0603  GLUCAP 76 152* 144*    Wt Readings from Last 3 Encounters:  06/20/18 63.2 kg  06/10/18 65.4 kg  06/04/18 79.4 kg    Physical Exam:  BP (!) 139/42 (BP Location: Right Arm)   Pulse 95   Temp 99.2 F (37.3 C) (Oral)   Resp 20   Ht 5\' 4"  (1.626 m)   Wt 63.2 kg   SpO2 100%   BMI 23.92 kg/m  Constitutional: She has sickly appearance. NAD. HENT: normocephalic. Atraumatic Eyes: disconjugate gaze Cardiac: RRR.  No JVD. Lungs: Clear.  Unlabored. Abdomen: Positive bowel sounds, nondistended Musculoskeletal: Right AKA Neurologic: Alert.oriented to person place and day of week Motor:  4/5 left hip flexors, knee extensors, ankle dorsiflexor, unchanged 4+/5 right hip flexion, unchanged Skin: Right AKA with dressing C/D/I Left foot dressing   Assessment/Plan: 1. Functional deficits secondary to right AKA with left lower extremity dry gangrene which require 3+ hours per day of interdisciplinary therapy in a comprehensive inpatient rehab setting. Physiatrist is providing close team supervision and 24 hour management of active medical problems listed below. Physiatrist and rehab team continue to assess barriers to discharge/monitor patient progress  toward functional and medical goals.  Function:  Bathing Bathing position   Position: Sitting EOB  Bathing parts Body parts bathed by patient: Right arm, Left arm, Chest, Abdomen, Right upper leg, Left upper leg Body parts bathed by helper: Front perineal area, Buttocks, Back  Bathing assist Assist Level: Set up   Set up : To obtain items  Upper Body Dressing/Undressing Upper body dressing   What is the patient wearing?: Pull over shirt/dress Bra - Perfomed by patient: Thread/unthread right bra strap, Thread/unthread left bra strap Bra - Perfomed by helper: Hook/unhook bra (pull down sports bra) Pull over shirt/dress - Perfomed by patient: Thread/unthread right sleeve, Thread/unthread left sleeve, Put head through opening, Pull shirt over trunk          Upper body assist Assist Level: Supervision or verbal cues      Lower Body Dressing/Undressing Lower body dressing   What is the patient wearing?: Pants     Pants- Performed by patient: Thread/unthread right pants leg, Thread/unthread left pants leg, Pull pants up/down Pants- Performed by helper: Pull pants up/down                      Lower body assist Assist for lower body dressing: Supervision or verbal cues      Toileting Toileting   Toileting steps completed by patient: Adjust clothing prior to toileting, Performs perineal hygiene, Adjust clothing after toileting Toileting steps completed by  helper: Performs perineal hygiene Toileting Assistive Devices: Statistician)  Toileting assist Assist level: Supervision or verbal cues   Transfers Chair/bed transfer   Chair/bed transfer method: Lateral scoot Chair/bed transfer assist level: Touching or steadying assistance (Pt > 75%) Chair/bed transfer assistive device: Armrests, Sliding board     Locomotion Ambulation Ambulation activity did not occur: Safety/medical concerns         Wheelchair   Type: Manual Max wheelchair distance: 100 Assist  Level: Supervision or verbal cues  Cognition Comprehension Comprehension assist level: Follows basic conversation/direction with extra time/assistive device  Expression Expression assist level: Expresses basic needs/ideas: With no assist  Social Interaction Social Interaction assist level: Interacts appropriately with others with medication or extra time (anti-anxiety, antidepressant).  Problem Solving Problem solving assist level: Solves basic 90% of the time/requires cueing < 10% of the time  Memory Memory assist level: Recognizes or recalls 90% of the time/requires cueing < 10% of the time    Medical Problem List and Plan: 1. Decline in functional mobility and ADLs  secondary to Right Above knee amputation  Cont CIR  Stump shrinker ordered 2.H/oDVT/ A fib/anticoagulation:Pharmaceutical:Other (comment)--Eliquis. 3.ChronicPain Management:On fentanyl 50 mcg TD (100 mcg PTA?) with prn oxycodone.  4. Mood:LCSW to follow for evaluation and support. Seems to be accepting of thedecision forR-AKA.  5. Neuropsych: This patientis not fullycapable of making decisions onherown behalf. 6. Skin/Wound Care:Continue nephro/prostat supplements to promote healing. Monitor wounds for healing.   Patient will likely require left lower extremity amputation future 7. Fluids/Electrolytes/Nutrition:Daily weights with strict I/O. Labs with HD. 8. T2DM with neuropathy and retionpathy, coundounded by steroids:   Was on levemir 10 units daily--resumed 5 units daily, increased to 10 units on 9/16, increased to 13 units on 9/20   CBG (last 3)  Recent Labs    06/19/18 1830 06/19/18 2145 06/20/18 0603  GLUCAP 76 152* 144*  controlled 9/22 9. PVD with left foot wound: Question due to calciphylaxis v/s pyoderma gangrenosum.  10. A fib/Aflutter:Monitor heart rate twice daily. On Eliquis and beta-blocker twice daily, may have had a run of RVR on 9/21, will cont to monitor , no extra metoprolol  today due to HD, if recurreent will ask cardiology to eval 11. SLE: Continue plaquenil and low dose prednisone.  12. ESRD: Schedule HD on TTS at end of day to help with tolerance of therapy.  13. Anemia of chronic disease:Arransep weekly started on 9/12.   Hemoglobin 8.5 on 9/19  Labs with HD  Continue to monitor 14. H/o Glaucoma: Stable on Timpotic and Xalatan.  15. H/o gastritis/erosive gastropathy: Per EGD 6/26. Continue PPI. Serial H/H monitored with HD. 16. Elevated blood pressure   labile on 9/21- lopressor is held on day of HD (tu, Th, Sa) Vitals:   06/19/18 1948 06/20/18 0504  BP:  (!) 139/42  Pulse:  95  Resp:  20  Temp: 98.5 F (36.9 C) 99.2 F (37.3 C)  SpO2:  100%  Controlled 9/22 17. Leukocytosis: resolved  WBCs 10.6 on 9/19, 9.1 on 9/21  Afebrile  Cont to monitor  LOS (Days) 10 A FACE TO FACE EVALUATION WAS PERFORMED  Charlett Blake 06/20/2018 7:16 AM

## 2018-06-20 NOTE — Progress Notes (Addendum)
Physical Therapy Session Note  Patient Details  Name: [name] MRN: [id number] Date of Birth: 1946-01-15  Today's Date: 06/20/2018 PT Individual Time: 1312-1405 PT Individual Time Calculation (min): 53 min   Short Term Goals: Week 1:  PT Short Term Goal 1 (Week 1): Pt will perform bed mobility with Supervision PT Short Term Goal 2 (Week 1): Pt will perform least restrictive transfer with min A PT Short Term Goal 2 - Progress (Week 1): Met PT Short Term Goal 3 (Week 1): Pt will propel w/c x 100 ft with Supervision PT Short Term Goal 3 - Progress (Week 1): Met Week 2:  PT Short Term Goal 1 (Week 2): = LTG due to estimated LOS      Skilled Therapeutic Interventions/Progress Updates:   Pt still eating lunch, and requested tx be delayed until she finished eating.  A-P transfer w/c>< mat with pillow case under LEs for reduced friction.  Min assist to initiate.  Pt's L hamstrings are tight, making it difficcult for her to tolerate long sitting during this transfer.  She benefits from being positioned near the edge of mat so that she can place foot off of edge PRN.  She was somewhat receptive to the idea of sleeping in a bed at home; need measurements of bed height.   In unsupported sitting ( L foot off of floor) edge of mat, 10 x 2 bil hip adduction against resistance for core activation, bil UE use to bounce ball from overhead to floor. 5 x 2 bil triceps press-ups with press-up blocks ( to elevate hips off of mat, with limited clearance); in R/L  Side lying, 10 x 2 L/R hip abduction in hip and knee flexion   Side lying> sitting on mat, to R with supervision, to L with min assist.  W/c propulsion over level tile with cues for efficiency and turns.  Pt left resting in w/c with needs at hand.    Therapy Documentation Precautions:  Precautions Precautions: Fall, Other (comment) Precaution Comments: R AKA, L foot wounds Restrictions Weight Bearing Restrictions: Yes RLE Weight  Bearing: Non weight bearing Other Position/Activity Restrictions: caution about trying to put weight through left LE due to wounds on forefoot  Pain: Pain Assessment Pain Scale: 0-10 Pain Score: 7  Pain Type: Acute pain Pain Location: Foot Pain Orientation: Left And R thigh Pain Descriptors / Indicators: Aching;Throbbing Pain Frequency: Intermittent  Pain Intervention(s): Medication (See eMAR)     See Function Navigator for Current Functional Status.   Therapy/Group: Individual Therapy  Ameir Faria 06/20/2018, 3:01 PM

## 2018-06-20 NOTE — Progress Notes (Signed)
Occupational Therapy Session Note  Patient Details  Name: [name] MRN: [id number] Date of Birth: 1946-08-03  Today's Date: 06/20/2018 OT Individual Time: 0867-6195 OT Individual Time Calculation (min): 69 min    Short Term Goals: Week 2:  OT Short Term Goal 1 (Week 2): STG=LTG due to LOS  Skilled Therapeutic Interventions/Progress Updates:    Pt seen for OT session foucsing on functional transfers and ADL re-training. PT sitting up in bed upon arrival, requesting to change clothes.  Throughout session, pt limited by pain in L LE pain requiring rest breaks throughout,not time for scheduled pain meds per pt and declined need to alert RN.  Throughout session, completed sliding board transfers with overall min A with significantly increased time required and VCs for head/hip relationship and hand placement during transfer.  She bathed  Seated on padded tub bench as planned for home. Set-up of supplies and completed lateral leans onto bed and into w/c placed on side in order to complete pericare/buttock hygiene and LB clothing management. Total A to pull pants up/down, pt able to complete lateral lean and return to midline with supervision.  Pt completed sliding board transfer back to w/c at end of session with supervision and assist to [place] equipment. Pt at high risk for skin breakdown with transfers as she is unable to clear buttock during transfer. Cont to work on UE strengthening in order to increase ease and clearance with transfer. Pt left seated in w/c at end of session with all needs in reach and RN present.    Therapy Documentation  Precautions:  Precautions Precautions: Fall, Other (comment) Precaution Comments: R AKA, L foot wounds Restrictions Weight Bearing Restrictions: Yes RLE Weight Bearing: Non weight bearing Other Position/Activity Restrictions: caution about trying to put weight through left LE due to wounds on forefoot  See Function Navigator for Current  Functional Status.   Therapy/Group: Individual Therapy  Maddix Heinz L 06/20/2018, 9:55 AM

## 2018-06-20 NOTE — Plan of Care (Signed)
  Problem: RH PAIN MANAGEMENT Goal: RH STG PAIN MANAGED AT OR BELOW PT'S PAIN GOAL Description <4 on a 1-10 pain scale  06/20/2018 1832 by Erie Noe, LPN Outcome: Not Progressing  Pt has had x2 doses of Perocet 5/325mg  and x2 doses of Tylenol 650mg . Pain level has not decreased below level 7. Pt also complaining of persistent pain in Right lower quandrant abdominal pain that radiates to groin area. Pt states that pain throbs. Also has pain in R AKA and L foot.

## 2018-06-20 NOTE — Progress Notes (Addendum)
[facility] Progress Note   Subjective: No new complaints  Objective Vitals:   06/19/18 1948 06/20/18 0504 06/20/18 0601 06/20/18 1057  BP:  (!) 139/42  121/86  Pulse:  95  88  Resp:  20    Temp: 98.5 F (36.9 C) 99.2 F (37.3 C)    TempSrc: Oral Oral    SpO2:  100%  100%  Weight:   63.2 kg   Height:       Physical Exam General: Pleasant, elderly female in NAD Heart: N1,Z0, 2/6 systolic M. Lungs: CTAB Abdomen: S, NT Extremities: No LLE edema. R AKA stump in wrap Dialysis Access: LUA AVF + bruit, not developing well, RIJ TDC Drsg CDI.    Additional Objective Labs: Basic Metabolic Panel: Recent Labs  Lab 06/15/18 1320 06/17/18 1403 06/19/18 1417  NA 133* 129* 132*  K 3.7 3.9 4.0  CL 95* 91* 95*  CO2 26 23 25   GLUCOSE 143* 161* 102*  BUN 68* 59* 64*  CREATININE 5.28* 4.70* 4.59*  CALCIUM 8.1* 8.0* 8.1*  PHOS 4.3 4.8* 4.2   Liver Function Tests: Recent Labs  Lab 06/15/18 1320 06/17/18 1403 06/19/18 1417  ALBUMIN 1.7* 1.8* 1.8*   No results for input(s): LIPASE, AMYLASE in the last 168 hours. CBC: Recent Labs  Lab 06/15/18 1320 06/17/18 1403 06/19/18 1416  WBC 9.6 10.6* 9.1  HGB 8.5* 8.5* 8.5*  HCT 29.7* 30.0* 29.6*  MCV 94.9 94.6 93.7  PLT 269 275 235   Blood Culture    Component Value Date/Time   SDES BLOOD RIGHT ANTECUBITAL 01/16/2018 1800   SPECREQUEST  01/16/2018 1800    BOTTLES DRAWN AEROBIC AND ANAEROBIC Blood Culture adequate volume   CULT  01/16/2018 1800    NO GROWTH 5 DAYS Performed at [facility], [place] [address], [place], [place] [address]    REPTSTATUS 01/21/2018 FINAL 01/16/2018 1800    Cardiac Enzymes: No results for input(s): CKTOTAL, CKMB, CKMBINDEX, TROPONINI in the last 168 hours. CBG: Recent Labs  Lab 06/19/18 1146 06/19/18 1830 06/19/18 2145 06/20/18 0603 06/20/18 1134  GLUCAP 104* 76 152* 144* 118*   Iron Studies: No results for input(s): IRON, TIBC, TRANSFERRIN, FERRITIN in the last  72 hours. @lablastinr3 @ Studies/Results: No results found. Medications:  . allopurinol  100 mg Oral Q M,W,F  . apixaban  5 mg Oral BID  . atorvastatin  20 mg Oral QHS  . brimonidine  1 drop Left Eye BID   And  . timolol  1 drop Left Eye BID  . Chlorhexidine Gluconate Cloth  6 each Topical Q0600  . darbepoetin (ARANESP) injection - DIALYSIS  200 mcg Intravenous Q Thu-HD  . [START ON 06/22/2018] darbepoetin (ARANESP) injection - DIALYSIS  200 mcg Intravenous Once  . dicyclomine  10 mg Oral TID AC & HS  . docusate sodium  100 mg Oral Daily  . feeding supplement (NEPRO CARB STEADY)  237 mL Oral TID WC  . feeding supplement (PRO-STAT SUGAR FREE 64)  30 mL Oral BID  . fentaNYL  50 mcg Transdermal Q72H  . hydroxychloroquine  200 mg Oral Daily  . insulin aspart  0-15 Units Subcutaneous TID WC  . insulin detemir  13 Units Subcutaneous Daily  . latanoprost  1 drop Both Eyes QHS  . metoprolol tartrate  12.5 mg Oral 2 times per day on Sun Mon Wed Fri  . multivitamin  1 tablet Oral QHS  . pantoprazole  40 mg Oral Daily  . predniSONE  5 mg Oral Q breakfast     Dialysis Orders: TTS at [facility] 3.5h 69.5kg 2/2.5 bath AVF+ TDCHep 2000 - Venofer 50mg  IV weekly - Mircera 268mcg IV q 2 weeks (last given 9/5 - Hgb 7.4 at that time)  Assessment/Plan: 1. S/p R AKA 9/10(non-healingwounds):on CIR -continue to do well with PT/OT. 2. ESRD:Continue TTS schedule, next today. Next HD 09/24 3. Hypertension/volume:BPcontrolled, EDW will be loweredon d/c. No excess volume on exam.  4. Anemia:Hgb8.5, continue Aranesp 246mcg q Thursday. 5. Metabolic bone disease:Ca 8.1 C Ca 9.8 Phos 4.2 No binders, VDRA yet.  6. Nutrition:Alb 1.8continue Nepro and Pro-stat supps. 7. Type 2 DM: Per primary. 8. PVD, L foot wound:Perprimary  Rita H. Brown NP-C 06/20/2018, 1:36 PM  [facility] [phone]  Pt seen, examined and agree w A/P as above.  Kelly Splinter  MD [employer] pager [phone]   06/20/2018, 2:11 PM

## 2018-06-21 ENCOUNTER — Inpatient Hospital Stay (HOSPITAL_COMMUNITY): Payer: Medicare Other | Admitting: Occupational Therapy

## 2018-06-21 ENCOUNTER — Inpatient Hospital Stay (HOSPITAL_COMMUNITY): Payer: Medicare Other | Admitting: Physical Therapy

## 2018-06-21 ENCOUNTER — Ambulatory Visit (HOSPITAL_COMMUNITY): Payer: Medicare Other | Admitting: Physical Therapy

## 2018-06-21 ENCOUNTER — Encounter (HOSPITAL_COMMUNITY): Payer: Medicare Other | Admitting: Occupational Therapy

## 2018-06-21 DIAGNOSIS — R0989 Other specified symptoms and signs involving the circulatory and respiratory systems: Secondary | ICD-10-CM

## 2018-06-21 LAB — GLUCOSE, CAPILLARY
GLUCOSE-CAPILLARY: 80 mg/dL (ref 70–99)
GLUCOSE-CAPILLARY: 26 mg/dL — AB (ref 70–99)
GLUCOSE-CAPILLARY: 39 mg/dL — AB (ref 70–99)
Glucose-Capillary: 145 mg/dL — ABNORMAL HIGH (ref 70–99)
Glucose-Capillary: 38 mg/dL — CL (ref 70–99)
Glucose-Capillary: 70 mg/dL (ref 70–99)
Glucose-Capillary: 97 mg/dL (ref 70–99)
Glucose-Capillary: 158 mg/dL — ABNORMAL HIGH (ref 70–99)

## 2018-06-21 MED ORDER — GLUCOSE 40 % PO GEL
ORAL | Status: AC
  Administered 2018-06-21: 37.5 g
  Filled 2018-06-21: qty 1

## 2018-06-21 MED ORDER — GLUCOSE 40 % PO GEL
ORAL | Status: AC
  Filled 2018-06-21: qty 1

## 2018-06-21 NOTE — Progress Notes (Addendum)
Occupational Therapy Session Note  Patient Details  Name: [name] MRN: [id number] Date of Birth: [birth year]  Today's Date: 06/21/2018 OT Individual Time: 9242-6834 and 1300-1400 OT Individual Time Calculation (min): 60 min and 60 min   Short Term Goals: Week 2:  OT Short Term Goal 1 (Week 2): STG=LTG due to LOS  Skilled Therapeutic Interventions/Progress Updates:    Session One: Pt seen for OT session focusing on ADL re-training and functional transfers. Pt in supine upon arrival, on bed pan. Discussed pt's plans for toileting at home, plans to sleep in recliner and therefore bed pan not option (as well as pt too high level to require bed pan), pt reports "just not feeling like getting on the pot" as to reason for use of bedpan. Provided education regarding benefits and importance of upright toileting and participation in transfers for strengthening/endurance. Pt agreeable to get to padded tub bench with cutout for toileting . Required significantly increased time, cuing and eventual +2 for balance for sliding board transfer to padded tub bench due to poor weight imbalance and pt fatigue. Pt attempted to void on bench, still unsuccessful. Completed LB dressing on padded bench, VCs for lateral leans.  Pt returned to w/c via slide board. Pt left seated in w/c with all needs in reach.  Cont with education regarding recommendation for clothing for ease of dressing and transfers, POC, and d/c planning.   Session Two: Pt seen for OT session focusing on caregiver training of functional transfers and ADL routine. Pt sitting up in w/c upon arrival, no complaints of pain. [name] present for scheduled family ed session, however, requires encouragement for engagement and participation in hands on family education session. Pt self propelled w/c throughout unit with supervision, VCs for effective propulsion technique and increased time. Completed w/c>recliner> padded tub bench>w/c via slide  board with overall min A and significantly increased time. Assist required for placement of board and stabilization of equipment. Education provided to pt and husband regarding set-up of w/c in prep for transfers, importance of clothing for skin protection against board and proper weightshift/ head/hip relationship needed for effective techniques. Education and demonstration provided on how to provide more than min A if required when fatigued or on uneven surface transfer.  Education and demonstration also provided regarding use and purpose of shrinker for residual limb. Reviewed care of limb and shrinker. [name] provided assist to re-don with assist and cuing from therapist. Would benefit from cont education. [name] reports understanding of education provided, reports being willing and able to provide needed assist at d/c.  Pt returned to room at end of session, left seated in w/c with all needs in reach.   Therapy Documentation Precautions:  Precautions Precautions: Fall, Other (comment) Precaution Comments: R AKA, L foot wounds Restrictions Weight Bearing Restrictions: Yes RLE Weight Bearing: Non weight bearing Other Position/Activity Restrictions: caution about trying to put weight through left LE due to wounds on forefoot   Pain: Not formally ranked. L foot cramps throughout session with rest breaks provided   See Function Navigator for Current Functional Status.   Therapy/Group: Individual Therapy  Candis Kabel L 06/21/2018, 12:44 PM

## 2018-06-21 NOTE — Progress Notes (Signed)
TITIANNA, LOOMIS ([id number]) Visit Report for 05/26/2018 Arrival Information Details Patient Name: [name], [name]. Date of Service: 05/26/2018 2:45 PM Medical Record Number: [id number] Patient Account Number: [id number] Date of Birth/Sex: May 31, 1946 (72 y.o. F) Treating RN: Montey Hora Primary Care Pablo Stauffer: Sharilyn Sites Other Clinician: Referring Floyce Bujak: Sharilyn Sites Treating Lismary Kiehn/Extender: Tito Dine in Treatment: 11 Visit Information History Since Last Visit Added or deleted any medications: No Patient Arrived: Wheel Chair Any new allergies or adverse reactions: No Arrival Time: 14:45 Had a fall or experienced change in No Accompanied By: husband activities of daily living that may affect Transfer Assistance: Manual risk of falls: Patient Identification Verified: Yes Signs or symptoms of abuse/neglect since last visito No Secondary Verification Process Yes Hospitalized since last visit: No Completed: Implantable device outside of the clinic excluding No Patient Requires Transmission-Based No cellular tissue based products placed in the center Precautions: since last visit: Patient Has Alerts: Yes Has Dressing in Place as Prescribed: Yes Patient Alerts: Patient on Blood Has Compression in Place as Prescribed: Yes Thinner Pain Present Now: Yes Eliquis DM II Electronic Signature(s) Signed: 05/26/2018 5:10:17 PM By: Montey Hora Entered By: Montey Hora on 05/26/2018 14:45:50 [name] ([id number]) -------------------------------------------------------------------------------- Clinic Level of Care Assessment Details Patient Name: [name]. Date of Service: 05/26/2018 2:45 PM Medical Record Number: [id number] Patient Account Number: [id number] Date of Birth/Sex: 1946-07-13 (72 y.o. F) Treating RN: Cornell Barman Primary Care Indiah Heyden: Sharilyn Sites Other Clinician: Referring Fallon Haecker: Sharilyn Sites Treating  Odie Rauen/Extender: Tito Dine in Treatment: 11 Clinic Level of Care Assessment Items TOOL 4 Quantity Score []  - Use when only an EandM is performed on FOLLOW-UP visit 0 ASSESSMENTS - Nursing Assessment / Reassessment []  - Reassessment of Co-morbidities (includes updates in patient status) 0 X- 1 5 Reassessment of Adherence to Treatment Plan ASSESSMENTS - Wound and Skin Assessment / Reassessment []  - Simple Wound Assessment / Reassessment - one wound 0 X- 1 5 Complex Wound Assessment / Reassessment - multiple wounds []  - 0 Dermatologic / Skin Assessment (not related to wound area) ASSESSMENTS - Focused Assessment []  - Circumferential Edema Measurements - multi extremities 0 []  - 0 Nutritional Assessment / Counseling / Intervention []  - 0 Lower Extremity Assessment (monofilament, tuning fork, pulses) []  - 0 Peripheral Arterial Disease Assessment (using hand held doppler) ASSESSMENTS - Ostomy and/or Continence Assessment and Care []  - Incontinence Assessment and Management 0 []  - 0 Ostomy Care Assessment and Management (repouching, etc.) PROCESS - Coordination of Care X - Simple Patient / Family Education for ongoing care 1 15 []  - 0 Complex (extensive) Patient / Family Education for ongoing care []  - 0 Staff obtains Programmer, systems, Records, Test Results / Process Orders []  - 0 Staff telephones HHA, Nursing Homes / Clarify orders / etc []  - 0 Routine Transfer to another Facility (non-emergent condition) []  - 0 Routine Hospital Admission (non-emergent condition) []  - 0 New Admissions / Biomedical engineer / Ordering NPWT, Apligraf, etc. []  - 0 Emergency Hospital Admission (emergent condition) X- 1 10 Simple Discharge Coordination [name], [name]. ([id number]) []  - 0 Complex (extensive) Discharge Coordination PROCESS - Special Needs []  - Pediatric / Minor Patient Management 0 []  - 0 Isolation Patient Management []  - 0 Hearing / Language / Visual special  needs []  - 0 Assessment of Community assistance (transportation, D/C planning, etc.) []  - 0 Additional assistance / Altered mentation []  - 0 Support Surface(s) Assessment (bed, cushion, seat, etc.) INTERVENTIONS - Wound Cleansing / Measurement  X - Simple Wound Cleansing - one wound 1 5 []  - 0 Complex Wound Cleansing - multiple wounds X- 1 5 Wound Imaging (photographs - any number of wounds) []  - 0 Wound Tracing (instead of photographs) X- 1 5 Simple Wound Measurement - one wound []  - 0 Complex Wound Measurement - multiple wounds INTERVENTIONS - Wound Dressings []  - Small Wound Dressing one or multiple wounds 0 []  - 0 Medium Wound Dressing one or multiple wounds X- 1 20 Large Wound Dressing one or multiple wounds []  - 0 Application of Medications - topical []  - 0 Application of Medications - injection INTERVENTIONS - Miscellaneous []  - External ear exam 0 []  - 0 Specimen Collection (cultures, biopsies, blood, body fluids, etc.) []  - 0 Specimen(s) / Culture(s) sent or taken to Lab for analysis []  - 0 Patient Transfer (multiple staff / Civil Service fast streamer / Similar devices) []  - 0 Simple Staple / Suture removal (25 or less) []  - 0 Complex Staple / Suture removal (26 or more) []  - 0 Hypo / Hyperglycemic Management (close monitor of Blood Glucose) []  - 0 Ankle / Brachial Index (ABI) - do not check if billed separately X- 1 5 Vital Signs [name], [name]. ([id number]) Has the patient been seen at the hospital within the last three years: Yes Total Score: 75 Level Of Care: New/Established - Level 2 Electronic Signature(s) Signed: 05/26/2018 5:28:07 PM By: Gretta Cool, BSN, RN, CWS, Kim RN, BSN Entered By: Gretta Cool, BSN, RN, CWS, Kim on 05/26/2018 15:16:26 [name] ([id number]) -------------------------------------------------------------------------------- Encounter Discharge Information Details Patient Name: [name]. Date of Service: 05/26/2018 2:45  PM Medical Record Number: [id number] Patient Account Number: [id number] Date of Birth/Sex: July 16, 1946 (72 y.o. F) Treating RN: Roger Shelter Primary Care Airi Copado: Sharilyn Sites Other Clinician: Referring Jerett Odonohue: Sharilyn Sites Treating Sherle Mello/Extender: Tito Dine in Treatment: 60 Encounter Discharge Information Items Discharge Condition: Stable Ambulatory Status: Wheelchair Discharge Destination: Home Transportation: Private Auto Accompanied By: husband Schedule Follow-up Appointment: Yes Clinical Summary of Care: Electronic Signature(s) Signed: 05/28/2018 12:21:16 PM By: Roger Shelter Entered By: Roger Shelter on 05/26/2018 15:37:15 [name] ([id number]) -------------------------------------------------------------------------------- Lower Extremity Assessment Details Patient Name: [name]. Date of Service: 05/26/2018 2:45 PM Medical Record Number: [id number] Patient Account Number: [id number] Date of Birth/Sex: August 20, 1946 (72 y.o. F) Treating RN: Montey Hora Primary Care Rishard Delange: Sharilyn Sites Other Clinician: Referring Brodie Scovell: Sharilyn Sites Treating Leniya Breit/Extender: Tito Dine in Treatment: 11 Vascular Assessment Pulses: Dorsalis Pedis Palpable: [Left:No] [Right:No] Doppler Audible: [Left:Yes] [Right:Inaudible] Posterior Tibial Doppler Audible: [Left:Yes] Extremity colors, hair growth, and conditions: Extremity Color: [Left:Hyperpigmented] [Right:Dusky] Hair Growth on Extremity: [Left:No] Temperature of Extremity: [Left:Cool] [Right:Cool] Electronic Signature(s) Signed: 05/26/2018 5:10:17 PM By: Montey Hora Entered By: Montey Hora on 05/26/2018 15:03:36 [name] ([id number]) -------------------------------------------------------------------------------- Multi Wound Chart Details Patient Name: [name]. Date of Service: 05/26/2018 2:45 PM Medical Record Number:  [id number] Patient Account Number: [id number] Date of Birth/Sex: 07/19/1946 (72 y.o. 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 Extremity Lower  Extremity Date Acquired: 06/29/2017 06/29/2017 06/29/2017 Weeks of Treatment: 11 11 11  Wound Status: Open Open Open Clustered Wound: No No No Measurements L x W x D 35x9x0.3 14x9.5x0.5 1.9x8.6x0.1 (cm) Area (cm) : 247.4 104.458 12.833 Volume (cm) : 74.22 52.229 1.283 % Reduction in Area: -613.30% 8.90% -1385.30% % Reduction in Volume: -969.90% -51.80% -1391.90% Classification: Full Thickness Without Full Thickness With Exposed Full Thickness Without Exposed Support Structures Support Structures Exposed Support Structures Periwound Skin Texture: No Abnormalities Noted No Abnormalities Noted No Abnormalities Noted Periwound Skin Moisture: No Abnormalities Noted No Abnormalities Noted No Abnormalities Noted Periwound Skin Color: No Abnormalities Noted No Abnormalities Noted No Abnormalities Noted Tenderness on Palpation: No No No Wound Number: 4 5 N/A Photos: N/A [name], [name] ([id number]) Wound Location: Left Foot Right Foot N/A Wounding Event: Pressure Injury Pressure Injury N/A Primary Etiology: Diabetic Wound/Ulcer of the Diabetic Wound/Ulcer of the N/A Lower  Extremity Lower Extremity Secondary Etiology: N/A N/A N/A Date Acquired: 04/05/2018 04/05/2018 N/A Weeks of Treatment: 7 7 N/A Wound Status: Open Open N/A Clustered Wound: Yes Yes N/A Measurements L x W x D 4.3x9x0.1 6.3x8.4x0.1 N/A (cm) Area (cm) : 30.395 41.563 N/A Volume (cm) : 3.039 4.156 N/A % Reduction in Area: -1834.80% -1002.50% N/A % Reduction in Volume: -1835.70% -1002.40% N/A Classification: Grade 1 Grade 1 N/A Periwound Skin Texture: No Abnormalities Noted No Abnormalities Noted N/A Periwound Skin Moisture: No Abnormalities Noted No Abnormalities Noted N/A Periwound Skin Color: No Abnormalities Noted No Abnormalities Noted N/A Tenderness on Palpation: No No N/A Treatment Notes Electronic Signature(s) Signed: 05/26/2018 5:28:07 PM By: Gretta Cool, BSN, RN, CWS, Kim RN, BSN Entered By: Gretta Cool, BSN, RN, CWS, Kim on 05/26/2018 15:11:31 [name] ([id number]) -------------------------------------------------------------------------------- [place] Details Patient Name: [name], [name]. Date of Service: 05/26/2018 2:45 PM Medical Record Number: [id number] Patient Account Number: [id number] Date of Birth/Sex: 12-15-1945 (72 y.o. F) Treating RN: Cornell Barman Primary Care Meara Wiechman: Sharilyn Sites Other Clinician: Referring Nayda Riesen: Sharilyn Sites Treating Tenee Wish/Extender: Tito Dine in Treatment: 11 Active Inactive Electronic Signature(s) Signed: 06/16/2018 8:07:04 AM By: Gretta Cool, BSN, RN, CWS, Kim RN, BSN Previous Signature: 05/26/2018 5:28:07 PM Version By: Gretta Cool, BSN, RN, CWS, Kim RN, BSN Entered By: Gretta Cool, BSN, RN, CWS, Kim on 06/16/2018 08:07:03 [name] ([id number]) -------------------------------------------------------------------------------- Pain Assessment Details Patient Name: [name], [name]. Date of Service: 05/26/2018 2:45 PM Medical Record Number: [id number] Patient Account Number: [id number] Date of  Birth/Sex: November 05, 1945 (72 y.o. F) Treating RN: Montey Hora Primary Care Dunia Pringle: Sharilyn Sites Other Clinician: Referring Matilyn Fehrman: Sharilyn Sites Treating Aryah Doering/Extender: Tito Dine in Treatment: 11 Active Problems Location of Pain Severity and Description of Pain Patient Has Paino Yes Site Locations Pain Location: Generalized Pain, Pain in Ulcers With Dressing Change: Yes Duration of the Pain. Constant / Intermittento Constant Pain Management and Medication Current Pain Management: Electronic Signature(s) Signed: 05/26/2018 5:10:17 PM By: Montey Hora Entered By: Montey Hora on 05/26/2018 14:46:07 [name] ([id number]) -------------------------------------------------------------------------------- Patient/Caregiver Education Details Patient Name: [name]. Date of Service: 05/26/2018 2:45 PM Medical Record Number: [id number] Patient Account Number: [id number] Date of Birth/Gender: 1946/02/14 (72 y.o. F) Treating RN: Roger Shelter Primary Care Physician: Sharilyn Sites Other Clinician: Referring Physician: Sharilyn Sites Treating Physician/Extender: Tito Dine in Treatment: 11 Education Assessment Education Provided To: Patient Education Topics Provided Wound/Skin Impairment: Handouts: Caring for Your Ulcer Methods: Explain/Verbal Responses: State content correctly Electronic Signature(s) Signed: 05/28/2018 12:21:16 PM By: Roger Shelter Entered By: Roger Shelter on  05/26/2018 15:37:25 [name], [name] ([id number]) -------------------------------------------------------------------------------- Wound Assessment Details Patient Name: [name], [name]. Date of Service: 05/26/2018 2:45 PM Medical Record Number: [id number] Patient Account Number: [id number] Date of Birth/Sex: December 28, 1945 (72 y.o. F) Treating RN: Montey Hora Primary Care Ireland Virrueta: Sharilyn Sites Other Clinician: Referring Taelar Gronewold:  Sharilyn Sites Treating Janeka Libman/Extender: Tito Dine in Treatment: 11 Wound Status Wound Number: 1 Primary Etiology: Calciphylaxis Wound Location: Right, Anterior Lower Leg Secondary Diabetic Wound/Ulcer of the Lower Etiology: Extremity Wounding Event: Gradually Appeared Wound Status: Open Date Acquired: 06/29/2017 Weeks Of Treatment: 11 Clustered Wound: No Photos Photo Uploaded By: Montey Hora on 05/26/2018 15:05:36 Wound Measurements Length: (cm) 35 Width: (cm) 9 Depth: (cm) 0.3 Area: (cm) 247.4 Volume: (cm) 74.22 % Reduction in Area: -613.3% % Reduction in Volume: -969.9% Wound Description Full Thickness Without Exposed Support Classification: Structures Periwound Skin Texture Texture Color No Abnormalities Noted: No No Abnormalities Noted: No Moisture No Abnormalities Noted: No Electronic Signature(s) Signed: 05/26/2018 5:10:17 PM By: Montey Hora Entered By: Montey Hora on 05/26/2018 15:00:28 [name] ([id number]) -------------------------------------------------------------------------------- Wound Assessment Details Patient Name: [name]. Date of Service: 05/26/2018 2:45 PM Medical Record Number: [id number] Patient Account Number: [id number] Date of Birth/Sex: [birth year] (72 y.o. F) Treating RN: Montey Hora Primary Care Keitra Carusone: Sharilyn Sites Other Clinician: Referring Solon Alban: Sharilyn Sites Treating Dax Murguia/Extender: Tito Dine in Treatment: 11 Wound Status Wound Number: 2 Primary Etiology: Calciphylaxis Wound Location: Right, Posterior Lower Leg Secondary Diabetic Wound/Ulcer of the Lower Etiology: Extremity Wounding Event: Gradually Appeared Wound Status: Open Date Acquired: 06/29/2017 Weeks Of Treatment: 11 Clustered Wound: No Photos Photo Uploaded By: Montey Hora on 05/26/2018 15:05:38 Wound Measurements Length: (cm) 14 Width: (cm) 9.5 Depth: (cm) 0.5 Area: (cm)  104.458 Volume: (cm) 52.229 % Reduction in Area: 8.9% % Reduction in Volume: -51.8% Wound Description Full Thickness With Exposed Support Classification: Structures Periwound Skin Texture Texture Color No Abnormalities Noted: No No Abnormalities Noted: No Moisture No Abnormalities Noted: No Electronic Signature(s) Signed: 05/26/2018 5:10:17 PM By: Montey Hora Entered By: Montey Hora on 05/26/2018 15:00:28 [name] ([id number]) -------------------------------------------------------------------------------- Wound Assessment Details Patient Name: [name]. Date of Service: 05/26/2018 2:45 PM Medical Record Number: [id number] Patient Account Number: [id number] Date of Birth/Sex: 1946-08-27 (72 y.o. F) Treating RN: Montey Hora Primary Care Bonita Brindisi: Sharilyn Sites Other Clinician: Referring Editha Bridgeforth: Sharilyn Sites Treating Somaya Grassi/Extender: Tito Dine in Treatment: 11 Wound Status Wound Number: 3 Primary Etiology: Calciphylaxis Wound Location: Right, Lateral Calcaneus Secondary Diabetic Wound/Ulcer of the Lower Etiology: Extremity Wounding Event: Gradually Appeared Wound Status: Open Date Acquired: 06/29/2017 Weeks Of Treatment: 11 Clustered Wound: No Photos Photo Uploaded By: Montey Hora on 05/26/2018 15:05:55 Wound Measurements Length: (cm) 1.9 Width: (cm) 8.6 Depth: (cm) 0.1 Area: (cm) 12.833 Volume: (cm) 1.283 % Reduction in Area: -1385.3% % Reduction in Volume: -1391.9% Wound Description Full Thickness Without Exposed Support Classification: Structures Periwound Skin Texture Texture Color No Abnormalities Noted: No No Abnormalities Noted: No Moisture No Abnormalities Noted: No Electronic Signature(s) Signed: 05/26/2018 5:10:17 PM By: Montey Hora Entered By: Montey Hora on 05/26/2018 15:00:28 [name]  ([id number]) -------------------------------------------------------------------------------- Wound Assessment Details Patient Name: [name]. Date of Service: 05/26/2018 2:45 PM Medical Record Number: [id number] Patient Account Number: [id number] Date of Birth/Sex: [birth year] (72 y.o. F) Treating RN: Montey Hora Primary Care Trinnity Breunig: Sharilyn Sites Other Clinician: Referring Abelino Tippin: Sharilyn Sites Treating Merdis Snodgrass/Extender: Tito Dine in Treatment: 11 Wound Status Wound Number: 4 Primary Diabetic Wound/Ulcer of the  Lower Etiology: Extremity Wound Location: Left Foot Wound Status: Open Wounding Event: Pressure Injury Date Acquired: 04/05/2018 Weeks Of Treatment: 7 Clustered Wound: Yes Photos Photo Uploaded By: Montey Hora on 05/26/2018 15:05:55 Wound Measurements Length: (cm) 4.3 Width: (cm) 9 Depth: (cm) 0.1 Area: (cm) 30.395 Volume: (cm) 3.039 % Reduction in Area: -1834.8% % Reduction in Volume: -1835.7% Wound Description Classification: Grade 1 Periwound Skin Texture Texture Color No Abnormalities Noted: No No Abnormalities Noted: No Moisture No Abnormalities Noted: No Electronic Signature(s) Signed: 05/26/2018 5:10:17 PM By: Montey Hora Entered By: Montey Hora on 05/26/2018 15:00:28 [name] ([id number]) -------------------------------------------------------------------------------- Wound Assessment Details Patient Name: [name]. Date of Service: 05/26/2018 2:45 PM Medical Record Number: [id number] Patient Account Number: [id number] Date of Birth/Sex: April 17, 1946 (72 y.o. F) Treating RN: Montey Hora Primary Care Azile Minardi: Sharilyn Sites Other Clinician: Referring Adron Geisel: Sharilyn Sites Treating Skylinn Vialpando/Extender: Tito Dine in Treatment: 11 Wound Status Wound Number: 5 Primary Diabetic Wound/Ulcer of the Lower Etiology: Extremity Wound Location: Right Foot Wound Status:  Open Wounding Event: Pressure Injury Date Acquired: 04/05/2018 Weeks Of Treatment: 7 Clustered Wound: Yes Photos Photo Uploaded By: Montey Hora on 05/26/2018 15:06:06 Wound Measurements Length: (cm) 6.3 Width: (cm) 8.4 Depth: (cm) 0.1 Area: (cm) 41.563 Volume: (cm) 4.156 % Reduction in Area: -1002.5% % Reduction in Volume: -1002.4% Wound Description Classification: Grade 1 Periwound Skin Texture Texture Color No Abnormalities Noted: No No Abnormalities Noted: No Moisture No Abnormalities Noted: No Electronic Signature(s) Signed: 05/26/2018 5:10:17 PM By: Montey Hora Entered By: Montey Hora on 05/26/2018 15:00:28 [name] ([id number]) -------------------------------------------------------------------------------- [place] Details Patient Name: [name]. Date of Service: 05/26/2018 2:45 PM Medical Record Number: [id number] Patient Account Number: [id number] Date of Birth/Sex: [birth year] (72 y.o. F) Treating RN: Montey Hora Primary Care Malyah Ohlrich: Sharilyn Sites Other Clinician: Referring Gordon Carlson: Sharilyn Sites Treating Azam Gervasi/Extender: Tito Dine in Treatment: 11 Vital Signs Time Taken: 14:46 Temperature (F): 98.3 Height (in): 65 Pulse (bpm): 81 Weight (lbs): 164 Respiratory Rate (breaths/min): 18 Body Mass Index (BMI): 27.3 Blood Pressure (mmHg): 139/63 Reference Range: 80 - 120 mg / dl Electronic Signature(s) Signed: 05/26/2018 5:10:17 PM By: Montey Hora Entered By: Montey Hora on 05/26/2018 14:46:27

## 2018-06-21 NOTE — Progress Notes (Signed)
[place] PHYSICAL MEDICINE & REHABILITATION     PROGRESS NOTE  Subjective/Complaints:  Pt seen laying in bed this AM.  She states she slept well overnight.  She states she had a good weekend.  She states she is able to don and doff her shrinker independently.  ROS: Denies CP, SOB, N/V/D.  Objective: Vital Signs: Blood pressure (!) 157/46, pulse 82, temperature 98.5 F (36.9 C), temperature source Oral, resp. rate 18, height 5\' 4"  (1.626 m), weight 63.5 kg, SpO2 100 %. No results found. Recent Labs    06/19/18 1416  WBC 9.1  HGB 8.5*  HCT 29.6*  PLT 235   Recent Labs    06/19/18 1417  NA 132*  K 4.0  CL 95*  GLUCOSE 102*  BUN 64*  CREATININE 4.59*  CALCIUM 8.1*   CBG (last 3)  Recent Labs    06/20/18 1718 06/20/18 2140 06/21/18 0624  GLUCAP 118* 111* 80    Wt Readings from Last 3 Encounters:  06/21/18 63.5 kg  06/10/18 65.4 kg  06/04/18 79.4 kg    Physical Exam:  BP (!) 157/46 (BP Location: Right Arm)   Pulse 82   Temp 98.5 F (36.9 C) (Oral)   Resp 18   Ht 5\' 4"  (1.626 m)   Wt 63.5 kg   SpO2 100%   BMI 24.03 kg/m  Constitutional: She has sickly appearance. NAD. HENT: normocephalic. Atraumatic Eyes: disconjugate gaze Cardiac: RRR.  No JVD. Lungs: Clear.  Unlabored. Abdomen: Positive bowel sounds, nondistended Musculoskeletal: Right AKA Neurologic: Alert and oriented Motor:  4/5 left hip flexors, knee extensors, ankle dorsiflexor, stable 4+/5 right hip flexion, stable Skin: Right AKA with shrinker C/D/I Left foot dressing C/D/I  Assessment/Plan: 1. Functional deficits secondary to right AKA with left lower extremity dry gangrene which require 3+ hours per day of interdisciplinary therapy in a comprehensive inpatient rehab setting. Physiatrist is providing close team supervision and 24 hour management of active medical problems listed below. Physiatrist and rehab team continue to assess barriers to discharge/monitor patient progress toward  functional and medical goals.  Function:  Bathing Bathing position   Position: Other (comment)(Sitting on padded tub bench)  Bathing parts Body parts bathed by patient: Right arm, Left arm, Chest, Abdomen, Right upper leg, Left upper leg, Front perineal area, Buttocks Body parts bathed by helper: Back  Bathing assist Assist Level: Supervision or verbal cues   Set up : To obtain items  Upper Body Dressing/Undressing Upper body dressing   What is the patient wearing?: Pull over shirt/dress Bra - Perfomed by patient: Thread/unthread right bra strap, Thread/unthread left bra strap, Hook/unhook bra (pull down sports bra) Bra - Perfomed by helper: Hook/unhook bra (pull down sports bra) Pull over shirt/dress - Perfomed by patient: Thread/unthread right sleeve, Thread/unthread left sleeve, Put head through opening, Pull shirt over trunk          Upper body assist Assist Level: Supervision or verbal cues      Lower Body Dressing/Undressing Lower body dressing   What is the patient wearing?: Pants     Pants- Performed by patient: Thread/unthread right pants leg, Thread/unthread left pants leg, Pull pants up/down Pants- Performed by helper: Pull pants up/down                      Lower body assist Assist for lower body dressing: Supervision or verbal cues      Toileting Toileting   Toileting steps completed by patient: Performs perineal hygiene  Toileting steps completed by helper: Adjust clothing prior to toileting, Adjust clothing after toileting Toileting Assistive Devices: Grab bar or rail(walker)  Toileting assist Assist level: Supervision or verbal cues   Transfers Chair/bed transfer   Chair/bed transfer method: Anterior/posterior Chair/bed transfer assist level: Touching or steadying assistance (Pt > 75%) Chair/bed transfer assistive device: Armrests     Locomotion Ambulation Ambulation activity did not occur: Safety/medical concerns         Wheelchair    Type: Manual Max wheelchair distance: 80 Assist Level: Supervision or verbal cues  Cognition Comprehension Comprehension assist level: Follows basic conversation/direction with extra time/assistive device  Expression Expression assist level: Expresses basic needs/ideas: With no assist  Social Interaction Social Interaction assist level: Interacts appropriately with others with medication or extra time (anti-anxiety, antidepressant).  Problem Solving Problem solving assist level: Solves basic 90% of the time/requires cueing < 10% of the time  Memory Memory assist level: Recognizes or recalls 90% of the time/requires cueing < 10% of the time    Medical Problem List and Plan: 1. Decline in functional mobility and ADLs  secondary to Right Above knee amputation  Cont CIR  Stump shrinker ordered 2.H/oDVT/ A fib/anticoagulation:Pharmaceutical:Other (comment)--Eliquis. 3.ChronicPain Management:On fentanyl 50 mcg TD (100 mcg PTA?) with prn oxycodone.  4. Mood:LCSW to follow for evaluation and support. Seems to be accepting of thedecision forR-AKA.  5. Neuropsych: This patientis not fullycapable of making decisions onherown behalf. 6. Skin/Wound Care:Continue nephro/prostat supplements to promote healing. Monitor wounds for healing.   Patient will likely require left lower extremity amputation future 7. Fluids/Electrolytes/Nutrition:Daily weights with strict I/O. Labs with HD. 8. T2DM with neuropathy and retionpathy, coundounded by steroids:   Was on levemir 10 units daily--resumed 5 units daily, increased to 10 units on 9/16, increased to 13 units on 9/20   CBG (last 3)  Recent Labs    06/20/18 1718 06/20/18 2140 06/21/18 0624  GLUCAP 118* 111* 80   ?  Improving on 9/23 9. PVD with left foot wound: Question due to calciphylaxis v/s pyoderma gangrenosum.  10. A fib/Aflutter:Monitor heart rate twice daily. On Eliquis and beta-blocker twice daily, may have had a run of  RVR on 9/21, will cont to monitor , no extra metoprolol today due to HD, if recurreent will ask cardiology to eval 11. SLE: Continue plaquenil and low dose prednisone.  12. ESRD: Schedule HD on TTS at end of day to help with tolerance of therapy.  13. Anemia of chronic disease:Arransep weekly started on 9/12.   Hemoglobin 8.5 on 9/21  Labs with HD  Continue to monitor 14. H/o Glaucoma: Stable on Timpotic and Xalatan.  15. H/o gastritis/erosive gastropathy: Per EGD 6/26. Continue PPI. Serial H/H monitored with HD. 16. Elevated blood pressure Vitals:   06/20/18 1942 06/21/18 0627  BP: (!) 163/55 (!) 157/46  Pulse: 84 82  Resp: 18 18  Temp: 98.3 F (36.8 C) 98.5 F (36.9 C)  SpO2: 100% 100%   Labile, management per nephro. 17. Leukocytosis: resolved  WBCs 9.1 on 9/21  Afebrile  Cont to monitor  LOS (Days) 11 A FACE TO FACE EVALUATION WAS PERFORMED  Evett Kassa Lorie Phenix 06/21/2018 8:00 AM

## 2018-06-21 NOTE — Progress Notes (Signed)
[facility] Progress Note   Subjective:  Working with OT Doing well. No complaints this am   Objective Vitals:   06/20/18 1942 06/21/18 0500 06/21/18 0627 06/21/18 0939  BP: (!) 163/55  (!) 157/46 (!) 123/48  Pulse: 84  82 85  Resp: 18  18   Temp: 98.3 F (36.8 C)  98.5 F (36.9 C)   TempSrc: Oral  Oral   SpO2: 100%  100%   Weight:  63.5 kg    Height:       Physical Exam General: Pleasant, elderly female NAD Heart: M4,Q6, 2/6 systolic M. Lungs: CTAB Abdomen: S, NT Extremities: No LLE edema. R AKA stump in wrap Dialysis Access: LUA AVF RIJ TDC Drsg CDI.    Additional Objective Labs: Basic Metabolic Panel: Recent Labs  Lab 06/15/18 1320 06/17/18 1403 06/19/18 1417  NA 133* 129* 132*  K 3.7 3.9 4.0  CL 95* 91* 95*  CO2 26 23 25   GLUCOSE 143* 161* 102*  BUN 68* 59* 64*  CREATININE 5.28* 4.70* 4.59*  CALCIUM 8.1* 8.0* 8.1*  PHOS 4.3 4.8* 4.2   Liver Function Tests: Recent Labs  Lab 06/15/18 1320 06/17/18 1403 06/19/18 1417  ALBUMIN 1.7* 1.8* 1.8*   No results for input(s): LIPASE, AMYLASE in the last 168 hours. CBC: Recent Labs  Lab 06/15/18 1320 06/17/18 1403 06/19/18 1416  WBC 9.6 10.6* 9.1  HGB 8.5* 8.5* 8.5*  HCT 29.7* 30.0* 29.6*  MCV 94.9 94.6 93.7  PLT 269 275 235   Blood Culture    Component Value Date/Time   SDES BLOOD RIGHT ANTECUBITAL 01/16/2018 1800   SPECREQUEST  01/16/2018 1800    BOTTLES DRAWN AEROBIC AND ANAEROBIC Blood Culture adequate volume   CULT  01/16/2018 1800    NO GROWTH 5 DAYS Performed at [facility], [place] [address] [place], [place] [address]    REPTSTATUS 01/21/2018 FINAL 01/16/2018 1800    Cardiac Enzymes: No results for input(s): CKTOTAL, CKMB, CKMBINDEX, TROPONINI in the last 168 hours. CBG: Recent Labs  Lab 06/20/18 0603 06/20/18 1134 06/20/18 1718 06/20/18 2140 06/21/18 0624  GLUCAP 144* 118* 118* 111* 80   Iron Studies: No results for input(s): IRON, TIBC, TRANSFERRIN,  FERRITIN in the last 72 hours. @lablastinr3 @ Studies/Results: No results found. Medications:  . allopurinol  100 mg Oral Q M,W,F  . apixaban  5 mg Oral BID  . atorvastatin  20 mg Oral QHS  . brimonidine  1 drop Left Eye BID   And  . timolol  1 drop Left Eye BID  . Chlorhexidine Gluconate Cloth  6 each Topical Q0600  . darbepoetin (ARANESP) injection - DIALYSIS  200 mcg Intravenous Q Thu-HD  . [START ON 06/22/2018] darbepoetin (ARANESP) injection - DIALYSIS  200 mcg Intravenous Once  . dicyclomine  10 mg Oral TID AC & HS  . docusate sodium  100 mg Oral Daily  . feeding supplement (NEPRO CARB STEADY)  237 mL Oral TID WC  . feeding supplement (PRO-STAT SUGAR FREE 64)  30 mL Oral BID  . fentaNYL  50 mcg Transdermal Q72H  . hydroxychloroquine  200 mg Oral Daily  . insulin aspart  0-15 Units Subcutaneous TID WC  . insulin detemir  13 Units Subcutaneous Daily  . latanoprost  1 drop Both Eyes QHS  . metoprolol tartrate  12.5 mg Oral 2 times per day on Sun Mon Wed Fri  . multivitamin  1 tablet Oral QHS  . pantoprazole  40 mg Oral Daily  .  predniSONE  5 mg Oral Q breakfast     Dialysis Orders: TTS at [facility] 3.5h 69.5kg 2/2.5 bath AVF+ TDCHep 2000 - Venofer 50mg  IV weekly - Mircera 248mcg IV q 2 weeks (last given 9/5 - Hgb 7.4 at that time)  Assessment/Plan: 1. S/p R AKA 9/10(non-healingwounds):on CIR -continue to do well with PT/OT. 2. ESRD:Continue TTS schedule. Next HD 09/24 3. Hypertension/volume:BPcontrolled, EDW will be loweredon d/c. No excess volume on exam.  4. Anemia:Hgb8.5, continue Aranesp 252mcg q Thursday. 5. Metabolic bone disease:Ca/Phos ok. No binders, VDRA yet.  6. Nutrition:Alb 1.8continue Nepro and Pro-stat supps. 7. Type 2 DM: Per primary. 8. PVD, L foot wound:Perprimary  Lynnda Child PA-C [facility] Pager [phone] 06/21/2018,10:32 AM

## 2018-06-21 NOTE — Progress Notes (Signed)
Physical Therapy Session Note  Patient Details  Name: [name] MRN: [id number] Date of Birth: 09-22-1946  Today's Date: 06/21/2018 PT Individual Time: 1415-1510 PT Individual Time Calculation (min): 55 min   Short Term Goals: Week 2:  PT Short Term Goal 1 (Week 2): = LTG due to estimated LOS  Skilled Therapeutic Interventions/Progress Updates: Pt received seated in w/c, husband present for family education. Transported pt in/out to hospital entrance [place] for energy conservation. Performed car <>w/c transfer min/modA with transfer board; therapist provided moderate cues for w/c and board setup, husband providing physical assist. Performed transfer w/c <>recliner with husband providing setupA for board placement and minA for scooting hips and stabilizing board during transfer. Returned to room totalA; remained in w/c at end of session, all needs in reach.      Therapy Documentation Precautions:  Precautions Precautions: Fall, Other (comment) Precaution Comments: R AKA, L foot wounds Restrictions Weight Bearing Restrictions: Yes RLE Weight Bearing: Non weight bearing Other Position/Activity Restrictions: caution about trying to put weight through left LE due to wounds on forefoot  See Function Navigator for Current Functional Status.   Therapy/Group: Individual Therapy  Corliss Skains 06/21/2018, 3:12 PM

## 2018-06-21 NOTE — Progress Notes (Signed)
Pt's CBG went from 38 to 26 to 39 to 70.[name] been informed about each one of the results. Glucose gel was given to pt. She ate her dinner, she is alert and oriented,asymptomatic during the whole event.Keep monitoring pt. Closely.

## 2018-06-21 NOTE — Progress Notes (Signed)
Hypoglycemic Event  CBG: 38  Treatment:eatting dinner  Symptoms: None  Follow-up CBG: Time:1738 CBG  Result: 26  Possible Reasons for Event: Unknown  Comments/MD notified:Dan Huntington PAC    [name], [name]

## 2018-06-21 NOTE — Progress Notes (Signed)
Pt's last CBG was 70. Marlowe [place] PAC was notified.Keep monitoring pt. Closely.

## 2018-06-22 ENCOUNTER — Inpatient Hospital Stay (HOSPITAL_COMMUNITY): Payer: Medicare Other | Admitting: Physical Therapy

## 2018-06-22 ENCOUNTER — Inpatient Hospital Stay (HOSPITAL_COMMUNITY): Payer: Medicare Other | Admitting: Occupational Therapy

## 2018-06-22 ENCOUNTER — Inpatient Hospital Stay (HOSPITAL_COMMUNITY): Payer: Medicare Other

## 2018-06-22 DIAGNOSIS — E162 Hypoglycemia, unspecified: Secondary | ICD-10-CM

## 2018-06-22 DIAGNOSIS — I169 Hypertensive crisis, unspecified: Secondary | ICD-10-CM

## 2018-06-22 LAB — RENAL FUNCTION PANEL
Albumin: 1.8 g/dL — ABNORMAL LOW (ref 3.5–5.0)
Anion gap: 11 (ref 5–15)
BUN: 90 mg/dL — AB (ref 8–23)
CHLORIDE: 94 mmol/L — AB (ref 98–111)
CO2: 26 mmol/L (ref 22–32)
CREATININE: 5.89 mg/dL — AB (ref 0.44–1.00)
Calcium: 8.6 mg/dL — ABNORMAL LOW (ref 8.9–10.3)
GFR calc Af Amer: 7 mL/min — ABNORMAL LOW (ref >60)
GFR calc non Af Amer: 6 mL/min — ABNORMAL LOW (ref >60)
Glucose, Bld: 79 mg/dL (ref 70–99)
POTASSIUM: 4.6 mmol/L (ref 3.5–5.1)
Phosphorus: 4.5 mg/dL (ref 2.5–4.6)
Sodium: 131 mmol/L — ABNORMAL LOW (ref 135–145)

## 2018-06-22 LAB — CBC
HEMATOCRIT: 28.5 % — AB (ref 36.0–46.0)
Hemoglobin: 8.2 g/dL — ABNORMAL LOW (ref 12.0–15.0)
MCH: 26.7 pg (ref 26.0–34.0)
MCHC: 28.8 g/dL — AB (ref 30.0–36.0)
MCV: 92.8 fL (ref 78.0–100.0)
PLATELETS: 226 10*3/uL (ref 150–400)
RBC: 3.07 MIL/uL — ABNORMAL LOW (ref 3.87–5.11)
RDW: 16.5 % — AB (ref 11.5–15.5)
WBC: 9.1 10*3/uL (ref 4.0–10.5)

## 2018-06-22 LAB — GLUCOSE, CAPILLARY
GLUCOSE-CAPILLARY: 140 mg/dL — AB (ref 70–99)
GLUCOSE-CAPILLARY: 183 mg/dL — AB (ref 70–99)
Glucose-Capillary: 84 mg/dL (ref 70–99)
Glucose-Capillary: 68 mg/dL — ABNORMAL LOW (ref 70–99)
Glucose-Capillary: 225 mg/dL — ABNORMAL HIGH (ref 70–99)

## 2018-06-22 MED ORDER — INSULIN ASPART 100 UNIT/ML ~~LOC~~ SOLN
0.0000 [IU] | Freq: Every day | SUBCUTANEOUS | Status: DC
  Administered 2018-06-23: 3 [IU] via SUBCUTANEOUS

## 2018-06-22 MED ORDER — INSULIN DETEMIR 100 UNIT/ML ~~LOC~~ SOLN
10.0000 [IU] | Freq: Every day | SUBCUTANEOUS | Status: DC
  Administered 2018-06-22: 10 [IU] via SUBCUTANEOUS
  Filled 2018-06-22: qty 0.1

## 2018-06-22 MED ORDER — HEPARIN SODIUM (PORCINE) 1000 UNIT/ML DIALYSIS
1000.0000 [IU] | INTRAMUSCULAR | Status: DC | PRN
  Filled 2018-06-22: qty 1

## 2018-06-22 MED ORDER — SODIUM CHLORIDE 0.9 % IV SOLN: 100.0000 mL | INTRAVENOUS | Status: DC | PRN

## 2018-06-22 MED ORDER — PENTAFLUOROPROP-TETRAFLUOROETH EX AERO: 1.0000 "application " | INHALATION_SPRAY | CUTANEOUS | Status: DC | PRN

## 2018-06-22 MED ORDER — GLUCOSE 40 % PO GEL
ORAL | Status: AC
  Administered 2018-06-22: 15:00:00
  Filled 2018-06-22: qty 1

## 2018-06-22 MED ORDER — ALTEPLASE 2 MG IJ SOLR
2.0000 mg | Freq: Once | INTRAMUSCULAR | Status: DC | PRN
  Filled 2018-06-22: qty 2

## 2018-06-22 MED ORDER — LIDOCAINE HCL (PF) 1 % IJ SOLN: 5.0000 mL | INTRAMUSCULAR | Status: DC | PRN

## 2018-06-22 MED ORDER — INSULIN ASPART 100 UNIT/ML ~~LOC~~ SOLN
0.0000 [IU] | Freq: Three times a day (TID) | SUBCUTANEOUS | Status: DC
  Administered 2018-06-23: 4 [IU] via SUBCUTANEOUS

## 2018-06-22 MED ORDER — LIDOCAINE-PRILOCAINE 2.5-2.5 % EX CREA: 1.0000 "application " | TOPICAL_CREAM | CUTANEOUS | Status: DC | PRN

## 2018-06-22 MED ORDER — HEPARIN SODIUM (PORCINE) 1000 UNIT/ML DIALYSIS
2000.0000 [IU] | INTRAMUSCULAR | Status: DC | PRN
  Filled 2018-06-22: qty 2

## 2018-06-22 MED ORDER — INSULIN DETEMIR 100 UNIT/ML ~~LOC~~ SOLN
5.0000 [IU] | Freq: Every day | SUBCUTANEOUS | Status: DC
  Administered 2018-06-23 – 2018-06-24 (×2): 5 [IU] via SUBCUTANEOUS
  Filled 2018-06-22 (×4): qty 0.05

## 2018-06-22 NOTE — Progress Notes (Signed)
Occupational Therapy Session Note  Patient Details  Name: [name] MRN: [id number] Date of Birth: [birth year]  Today's Date: 06/22/2018 OT Individual Time: 1855-0158 OT Individual Time Calculation (min): 75 min    Short Term Goals: Week 1:  OT Short Term Goal 1 (Week 1): Pt will perform transfer to wide drop arm BSC with mod A  OT Short Term Goal 1 - Progress (Week 1): Met OT Short Term Goal 2 (Week 1): Pt will perform lateral leans with min A while pulling up pants OT Short Term Goal 2 - Progress (Week 1): Met OT Short Term Goal 3 (Week 1): Pt will don pants with mod A (2/3 steps) EOB OT Short Term Goal 3 - Progress (Week 1): Discontinued (comment)(POC changed to dressing bed level- supervision level) OT Short Term Goal 4 (Week 1): Pt will setup w/c in prep for transfers with supervision with mod cues OT Short Term Goal 4 - Progress (Week 1): Met  Skilled Therapeutic Interventions/Progress Updates:    Pt seen for therapy this session with a focus on lateral leans and transfers to complete self care from sitting position.  Pt received in bed stating she was about to urinate and did not have time to transfer to the [facility] so she asked for a bed pan. Bed pan placed but after several minutes pt was unable to go.  She was agreeable to transfers to drop arm BSC for B/D.  (the padded open seat tub bench was in the room but there was no bucket or pan available to fit).  Pt sat to EOB with S and a bed pad placed under pt's legs as she did not have pants on.  The board then was placed under the pad for pt to slide with the pad.  Pt needed min A to transfer to the [facility] to have A with pad.  Once on the [facility] pt was able to urinate and have a BM. She worked on lateral leans to self cleanse, then to have incontinence brief place on her.  Pt was having difficulty reaching forward enough to don pants over L foot so A pt with that, then pt donned over R limb.  She pulled pants up to hip crease but had great  difficult donning over hips with bulky brief.  Pt then worked on lateral leans as therapist pulled up pants.  Pt then used slide board to w/c with S and set up A to ensure slide board would not move.  Otherwise, pt moved herself on board to the w/c.  Pt set up in w/c with all needs met.   Therapy Documentation Precautions:  Precautions Precautions: Fall, Other (comment) Precaution Comments: R AKA, L foot wounds Restrictions Weight Bearing Restrictions: Yes RLE Weight Bearing: Non weight bearing Other Position/Activity Restrictions: caution about trying to put weight through left LE due to wounds on forefoot  Pain: Pain Assessment Pain Scale: 0-10 Pain Score: 7  Pain Type: Acute pain Pain Location: Leg Pain Orientation: Left Pain Descriptors / Indicators: Aching Pain Frequency: Intermittent Pain Onset: On-going Patients Stated Pain Goal: 2 Pain Intervention(s): Medication (See eMAR);Repositioned Multiple Pain Sites: No ADL:   See Function Navigator for Current Functional Status.   Therapy/Group: Individual Therapy  Zariana Strub 06/22/2018, 11:16 AM

## 2018-06-22 NOTE — Progress Notes (Addendum)
Pt's CBG was check before going to HEMO, blood sugar was 68,HEMO nurse was notified,glucose gel was given before pt. Went to dialysis.

## 2018-06-22 NOTE — Progress Notes (Signed)
Physical Therapy Session Note  Patient Details  Name: [name] MRN: [id number] Date of Birth: 06/20/1946  Today's Date: 06/22/2018 PT Individual Time: 1000-1045 PT Individual Time Calculation (min): 45 min   Short Term Goals: Week 2:  PT Short Term Goal 1 (Week 2): = LTG due to estimated LOS  Skilled Therapeutic Interventions/Progress Updates:    Pt seated in w/c upon PT arrival, agreeable to therapy tx and denies pain. Pt propelled w/c from room>gym x 150 ft using B UEs with min assist and increased time to complete. Pt transferred from w/c>mat with slideboard and min assist, increased time to complete with verbal cues for techniques and weightshifting. Pt transferred from sitting>supine with supervision. In supine pt performed UE/LE therex for strengthening, 2 x 10 each with therapist providing verbal cues for techniques/proper form: hip flexion, hip abduction, chest press with 3# dowel. Pt transferred from supine>prone with min assist, maintained prone position for hip flexor stretching. While in prone pt performed x 10 hamstring curls with L LE, x 10 R LE hip extension. Pt left on mat in care of PT for next therapy session.   Therapy Documentation Precautions:  Precautions Precautions: Fall, Other (comment) Precaution Comments: R AKA, L foot wounds Restrictions Weight Bearing Restrictions: Yes RLE Weight Bearing: Non weight bearing Other Position/Activity Restrictions: caution about trying to put weight through left LE due to wounds on forefoot   See Function Navigator for Current Functional Status.   Therapy/Group: Individual Therapy  Netta Corrigan, PT, DPT 06/22/2018, 7:57 AM

## 2018-06-22 NOTE — Significant Event (Signed)
Rapid Response Event Note  Overview: Time Called: 2028 Arrival Time: 2030 Event Type: Cardiac - Tachycardia  Initial Focused Assessment: RN called about patient's HR being elevated in the 160s. During VS check, HR was in the 160s, RN ausculated HR of 162, EKG was done - read SVT. Per RN, patient was asymptomatic. Upon arrival, patient was alert and oriented, denied palpitations, CP/SOB, stated she felt fine. When I arrived, HR was back in the 90 in SR. Skin warm and dry, patient had just returned from HD about an hour ago, BP stable.   Interventions: - EKG - looks like Atrial Flutter, now in SR  Plan of Care: - After reviewing the chart, patient has a history of AF/AFlutter and is on Eliquis and Metoprolol 12.5mg  BID ( on non- HD days) as well. On 9/21 had an episode of AF RVR per MD notes as well. - RN to call Rehab POC and update provider. I did order a portable continuous pulse ox monitoring for the overnight hours. RN will ask MD if that is okay as well.   Event Summary: Name of Physician Notified: Rehab RN to call Rehab Provider on call  at    Outcome: Stayed in room and stabalized  Event End Time: 2055  [place], [place]

## 2018-06-22 NOTE — Progress Notes (Signed)
[facility]IC[facility] RE[facility]ABILITATION     PROGRESS NOTE  Subjective/Complaints:  Patient seen sitting up in bed this morning.  She states she slept well overnight, confirmed with sleep chart.  She has questions about her left foot.  ROS: Denies CP, SOB, N/V/D.  Objective: Vital Signs: Blood pressure (!) 140/32, pulse 77, temperature 98.9 F (37.2 C), temperature source Oral, resp. rate 18, height 5\' 4"  (1.626 m), weight 65.4 kg, SpO2 96 %. No results found. Recent Labs    06/19/18 1416  WBC 9.1  HGB 8.5*  HCT 29.6*  PLT 235   Recent Labs    06/19/18 1417  NA 132*  K 4.0  CL 95*  GLUCOSE 102*  BUN 64*  CREATININE 4.59*  CALCIUM 8.1*   CBG (last 3)  Recent Labs    06/21/18 1914 06/21/18 2122 06/22/18 0636  GLUCAP 97 158* 140*    Wt Readings from Last 3 Encounters:  06/22/18 65.4 kg  06/10/18 65.4 kg  06/04/18 79.4 kg    Physical Exam:  BP (!) 140/32 (BP Location: Right Arm)   Pulse 77   Temp 98.9 F (37.2 C) (Oral)   Resp 18   Ht 5\' 4"  (1.626 m)   Wt 65.4 kg   SpO2 96%   BMI 24.75 kg/m  Constitutional: She has sickly appearance. NAD. HENT: normocephalic. Atraumatic Eyes: disconjugate gaze Cardiac: RRR.  No JVD. Lungs: Clear.  Unlabored. Abdomen: Positive bowel sounds, nondistended Musculoskeletal: Right AKA Neurologic: Alert and oriented Motor:  4/5 left hip flexors, knee extensors, ankle dorsiflexor, stable 4+/5 right hip flexion, stable Skin: Right AKA with shrinker C/D/I Left foot with dry gangrene  Assessment/Plan: 1. Functional deficits secondary to right AKA with left lower extremity dry gangrene which require 3+ hours per day of interdisciplinary therapy in a comprehensive inpatient rehab setting. Physiatrist is providing close team supervision and 24 hour management of active medical problems listed below. Physiatrist and rehab team continue to assess barriers to discharge/monitor patient progress toward functional and medical  goals.  Function:  Bathing Bathing position   Position: Other (comment)(Sitting on padded tub bench)  Bathing parts Body parts bathed by patient: Right arm, Left arm, Chest, Abdomen, Right upper leg, Left upper leg, Front perineal area, Buttocks Body parts bathed by helper: Back  Bathing assist Assist Level: Supervision or verbal cues   Set up : To obtain items  Upper Body Dressing/Undressing Upper body dressing   What is the patient wearing?: Pull over shirt/dress Bra - Perfomed by patient: Thread/unthread right bra strap, Thread/unthread left bra strap, Hook/unhook bra (pull down sports bra) Bra - Perfomed by helper: Hook/unhook bra (pull down sports bra) Pull over shirt/dress - Perfomed by patient: Thread/unthread right sleeve, Thread/unthread left sleeve, Put head through opening, Pull shirt over trunk          Upper body assist Assist Level: Supervision or verbal cues      Lower Body Dressing/Undressing Lower body dressing   What is the patient wearing?: Pants     Pants- Performed by patient: Thread/unthread right pants leg, Thread/unthread left pants leg, Pull pants up/down Pants- Performed by helper: Pull pants up/down                      Lower body assist Assist for lower body dressing: Supervision or verbal cues      Toileting Toileting   Toileting steps completed by patient: Performs perineal hygiene Toileting steps completed by helper: Adjust clothing  prior to toileting, Adjust clothing after toileting Toileting Assistive Devices: Grab bar or rail(walker)  Toileting assist Assist level: Supervision or verbal cues   Transfers Chair/bed transfer   Chair/bed transfer method: Lateral scoot Chair/bed transfer assist level: Touching or steadying assistance (Pt > 75%) Chair/bed transfer assistive device: Armrests, Sliding board     Locomotion Ambulation Ambulation activity did not occur: Safety/medical concerns         Wheelchair   Type:  Manual Max wheelchair distance: 80 Assist Level: Supervision or verbal cues  Cognition Comprehension Comprehension assist level: Understands basic 75 - 89% of the time/ requires cueing 10 - 24% of the time  Expression Expression assist level: Expresses basic 50 - 74% of the time/requires cueing 25 - 49% of the time. Needs to repeat parts of sentences.  Social Interaction Social Interaction assist level: Interacts appropriately 75 - 89% of the time - Needs redirection for appropriate language or to initiate interaction.  Problem Solving Problem solving assist level: Solves basic 50 - 74% of the time/requires cueing 25 - 49% of the time  Memory Memory assist level: Recognizes or recalls 50 - 74% of the time/requires cueing 25 - 49% of the time    Medical Problem List and Plan: 1. Decline in functional mobility and ADLs  secondary to Right Above knee amputation  Cont CIR  Stump shrinker ordered 2.H/oDVT/ A fib/anticoagulation:Pharmaceutical:Other (comment)--Eliquis. 3.ChronicPain Management:On fentanyl 50 mcg TD (100 mcg PTA?) with prn oxycodone.  4. Mood:LCSW to follow for evaluation and support. Seems to be accepting of thedecision forR-AKA.  5. Neuropsych: This patientis not fullycapable of making decisions onherown behalf. 6. Skin/Wound Care:Continue nephro/prostat supplements to promote healing. Monitor wounds for healing.   Patient will likely require left lower extremity amputation future 7. Fluids/Electrolytes/Nutrition:Daily weights with strict I/O. Labs with HD. 8. T2DM with neuropathy and retionpathy, coundounded by steroids:   Was on levemir 10 units daily--resumed 5 units daily, increased to 10 units on 9/16, increased to 13 units on 9/20 decreased to 10 on 9/24   CBG (last 3)  Recent Labs    06/21/18 1914 06/21/18 2122 06/22/18 0636  GLUCAP 97 158* 140*   Labile with hypoglycemia on 9/24 9. PVD with left foot wound: Question due to calciphylaxis v/s  pyoderma gangrenosum.  10. A fib/Aflutter:Monitor heart rate twice daily. On Eliquis and beta-blocker twice daily, may have had a run of RVR on 9/21, will cont to monitor , no extra metoprolol today due to HD, if recurreent will ask cardiology to eval 11. SLE: Continue plaquenil and low dose prednisone.  12. ESRD: Schedule HD on TTS at end of day to help with tolerance of therapy.  13. Anemia of chronic disease:Arransep weekly started on 9/12.   Hemoglobin 8.5 on 9/21  Labs with HD  Continue to monitor 14. H/o Glaucoma: Stable on Timpotic and Xalatan.  15. H/o gastritis/erosive gastropathy: Per EGD 6/26. Continue PPI. Serial H/H monitored with HD. 16. Elevated blood pressure Vitals:   06/21/18 1957 06/22/18 0446  BP: (!) 182/48 (!) 140/32  Pulse: 76 77  Resp: 18 18  Temp: 97.7 F (36.5 C) 98.9 F (37.2 C)  SpO2: 98% 96%   Extremely labile with hypertensive crisis overnight, management per nephro 17. Leukocytosis: resolved  WBCs 9.1 on 9/21  Afebrile  Cont to monitor  LOS (Days) 12 A FACE TO FACE EVALUATION WAS PERFORMED  Berniece Abid Lorie Phenix 06/22/2018 7:50 AM

## 2018-06-22 NOTE — Progress Notes (Signed)
[facility] Progress Note   Subjective:  Seen in room. Eating breakfast. No complaints this am.   Objective Vitals:   06/21/18 0939 06/21/18 1527 06/21/18 1957 06/22/18 0446  BP: (!) 123/48 (!) 111/42 (!) 182/48 (!) 140/32  Pulse: 85 69 76 77  Resp:  18 18 18   Temp:  97.7 F (36.5 C) 97.7 F (36.5 C) 98.9 F (37.2 C)  TempSrc:  Oral Oral Oral  SpO2:  97% 98% 96%  Weight:    65.4 kg  Height:       Physical Exam General: Pleasant, elderly female NAD Heart: Y4,I3, 2/6 systolic M. Lungs: CTAB Abdomen: S, NT Extremities: No LLE edema. R AKA stump in wrap Dialysis Access: LUA AVF +bruit pulsitile RIJ TDC Drsg CDI.    Additional Objective Labs: Basic Metabolic Panel: Recent Labs  Lab 06/15/18 1320 06/17/18 1403 06/19/18 1417  NA 133* 129* 132*  K 3.7 3.9 4.0  CL 95* 91* 95*  CO2 26 23 25   GLUCOSE 143* 161* 102*  BUN 68* 59* 64*  CREATININE 5.28* 4.70* 4.59*  CALCIUM 8.1* 8.0* 8.1*  PHOS 4.3 4.8* 4.2   Liver Function Tests: Recent Labs  Lab 06/15/18 1320 06/17/18 1403 06/19/18 1417  ALBUMIN 1.7* 1.8* 1.8*   No results for input(s): LIPASE, AMYLASE in the last 168 hours. CBC: Recent Labs  Lab 06/15/18 1320 06/17/18 1403 06/19/18 1416  WBC 9.6 10.6* 9.1  HGB 8.5* 8.5* 8.5*  HCT 29.7* 30.0* 29.6*  MCV 94.9 94.6 93.7  PLT 269 275 235   Blood Culture    Component Value Date/Time   SDES BLOOD RIGHT ANTECUBITAL 01/16/2018 1800   SPECREQUEST  01/16/2018 1800    BOTTLES DRAWN AEROBIC AND ANAEROBIC Blood Culture adequate volume   CULT  01/16/2018 1800    NO GROWTH 5 DAYS Performed at [facility], [place] [address] [place], [place] [address]    REPTSTATUS 01/21/2018 FINAL 01/16/2018 1800    Cardiac Enzymes: No results for input(s): CKTOTAL, CKMB, CKMBINDEX, TROPONINI in the last 168 hours. CBG: Recent Labs  Lab 06/21/18 1805 06/21/18 1834 06/21/18 1914 06/21/18 2122 06/22/18 0636  GLUCAP 39* 70 97 158* 140*   Iron  Studies: No results for input(s): IRON, TIBC, TRANSFERRIN, FERRITIN in the last 72 hours. @lablastinr3 @ Studies/Results: No results found. Medications:  . allopurinol  100 mg Oral Q M,W,F  . apixaban  5 mg Oral BID  . atorvastatin  20 mg Oral QHS  . brimonidine  1 drop Left Eye BID   And  . timolol  1 drop Left Eye BID  . Chlorhexidine Gluconate Cloth  6 each Topical Q0600  . darbepoetin (ARANESP) injection - DIALYSIS  200 mcg Intravenous Q Thu-HD  . darbepoetin (ARANESP) injection - DIALYSIS  200 mcg Intravenous Once  . dicyclomine  10 mg Oral TID AC & HS  . docusate sodium  100 mg Oral Daily  . feeding supplement (NEPRO CARB STEADY)  237 mL Oral TID WC  . feeding supplement (PRO-STAT SUGAR FREE 64)  30 mL Oral BID  . fentaNYL  50 mcg Transdermal Q72H  . hydroxychloroquine  200 mg Oral Daily  . insulin aspart  0-15 Units Subcutaneous TID WC  . insulin detemir  10 Units Subcutaneous Daily  . latanoprost  1 drop Both Eyes QHS  . metoprolol tartrate  12.5 mg Oral 2 times per day on Sun Mon Wed Fri  . multivitamin  1 tablet Oral QHS  . pantoprazole  40  mg Oral Daily  . predniSONE  5 mg Oral Q breakfast     Dialysis Orders: TTS at [facility] 3.5h 69.5kg 2/2.5 bath AVF+ TDCHep 2000 - Venofer 50mg  IV weekly - Mircera 2107mcg IV q 2 weeks (last given 9/5 - Hgb 7.4 at that time)  Assessment/Plan: 1. S/p R AKA 9/10(non-healingwounds):on CIR -continues to do well with PT/OT. 2. ESRD:Continue TTS schedule. HD today. LUE 2nd stage BVT that has not matured adequately. Per Dr. Donzetta Matters  will need fistulogram in the future  3. Hypertension/volume:BPcontrolled, EDW will be loweredon d/c. No excess volume on exam.  4. Anemia:Hgb8.5, continue Aranesp 260mcg q Thursday. Check Fe studies  5. Metabolic bone disease:Ca/Phos ok. No binders, VDRA yet.  6. Nutrition:Alb 1.8continue Nepro and Pro-stat supps. 7. Type 2 DM: Per primary. 8. PVD, L foot wound:Perprimary  Lynnda Child PA-C [facility] Pager [phone] 06/22/2018,8:42 AM

## 2018-06-22 NOTE — Progress Notes (Signed)
Pt apical pulse 162 on auscultation; EKG reading SVT; pt remains asymptomatic; rapid response RN contacted; on- call provider to be contacted; will continue to monitor

## 2018-06-22 NOTE — Progress Notes (Signed)
Physical Therapy Session Note  Patient Details  Name: [name] MRN: [id number] Date of Birth: 1945/12/17  Today's Date: 06/22/2018 PT Individual [id number] Total Time: 74 mins   Short Term Goals: Week 2:  PT Short Term Goal 1 (Week 2): = LTG due to estimated LOS  Skilled Therapeutic Interventions/Progress Updates:    Pt received positioned in prone in rehab gym and ready for PT services. MinA for bed mobility (rolling from prone>supine and rolling>L sidelying) and S for sidelying>sitting EOB. Shrinker removed and reapplied for proper fit, as it was slipping off and crooked. Today's session focused on transfers to/from w/c to mat table, car, recliner, and bed with increased time and mod cueing to prepare/set up for slideboard transfer (lock brakes, move leg rest, slide to edge of seat, move arm rest, and WS to place slideboard). Car and recliner transfers required minA, while mat and bed transfers required S. Propelled w/c ~123ft with S, increased time, and min cues for w/c navigation through door to prevent bumping in to door frame and walls. Educated how hand placement on wheels can aid with sharper turns and speed/propusion. Assisted with clothing change to hospital gown to prepare for dialysis. Pt remained supine with HOB elevated and all needs in reach.  Therapy Documentation Precautions:  Precautions Precautions: Fall, Other (comment) Precaution Comments: R AKA, L foot wounds Restrictions Weight Bearing Restrictions: Yes RLE Weight Bearing: Non weight bearing Other Position/Activity Restrictions: caution about trying to put weight through left LE due to wounds on forefoot Pain: Pain Assessment Pain Scale: 0-10 Pain Score: 3  Pain Type: Acute pain Pain Location: Leg Pain Orientation: Left Pain Descriptors / Indicators: Aching Pain Frequency: Intermittent Pain Onset: On-going Patients Stated Pain Goal: 2 Pain Intervention(s): Medication (See  eMAR);Repositioned Multiple Pain Sites: No   See Function Navigator for Current Functional Status.   Therapy/Group: Individual Therapy  [place] Naika Noto, SPT 06/22/2018, 12:24 PM

## 2018-06-23 ENCOUNTER — Inpatient Hospital Stay (HOSPITAL_COMMUNITY): Payer: Medicare Other | Admitting: Physical Therapy

## 2018-06-23 ENCOUNTER — Inpatient Hospital Stay (HOSPITAL_COMMUNITY): Payer: Medicare Other | Admitting: Occupational Therapy

## 2018-06-23 DIAGNOSIS — R7303 Prediabetes: Secondary | ICD-10-CM

## 2018-06-23 DIAGNOSIS — R7309 Other abnormal glucose: Secondary | ICD-10-CM

## 2018-06-23 DIAGNOSIS — I471 Supraventricular tachycardia: Secondary | ICD-10-CM

## 2018-06-23 LAB — GLUCOSE, CAPILLARY
GLUCOSE-CAPILLARY: 207 mg/dL — AB (ref 70–99)
GLUCOSE-CAPILLARY: 86 mg/dL (ref 70–99)
GLUCOSE-CAPILLARY: 118 mg/dL — AB (ref 70–99)
Glucose-Capillary: 110 mg/dL — ABNORMAL HIGH (ref 70–99)
Glucose-Capillary: 122 mg/dL — ABNORMAL HIGH (ref 70–99)
Glucose-Capillary: 270 mg/dL — ABNORMAL HIGH (ref 70–99)

## 2018-06-23 LAB — IRON AND TIBC
IRON: 23 ug/dL — AB (ref 28–170)
Saturation Ratios: 15 % (ref 10.4–31.8)
TIBC: 154 ug/dL — AB (ref 250–450)
UIBC: 131 ug/dL

## 2018-06-23 LAB — FERRITIN: Ferritin: 795 ng/mL — ABNORMAL HIGH (ref 11–307)

## 2018-06-23 NOTE — Progress Notes (Signed)
On-call [place], [place] contacted and updated on patient's previous and current status; pt on continuous pulse ox; recommendation to continue monitoring

## 2018-06-23 NOTE — Consult Note (Signed)
Cardiology Consultation:   Patient ID: [name]; [id number]; 18-Oct-1945   Admit date: 06/10/2018 Date of Consult: 06/23/2018  Primary Care Provider: Sharilyn Sites, MD Primary Cardiologist: No primary care provider on file.  Primary Electrophysiologist:  None   Patient Profile:   [name] is a 72 y.o. female with a hx of atrial fib flutter with distant cardioversion and chronic anticoagulation who is being seen today for the evaluation of arrhythmia at the request of Reesa Chew  History of Present Illness:   [name] has a history of chronic anticoagulation and atrial fib flutter.  She apparently has maintained NSR for years. She has had an echo in Feb with normal EF.  She had moderate AI.  She does have elevated pulmonary pressures.  She is hospitalized for AKA.  She has had a couple of episodes of rapid heart rate although she is not on tele.  She had this last night and she did have an EKG with probable 2:1 flutter.    She reports that she was noted to have an increased heart rate last night but she did not feel this.  The EKG was as above and I reviewed the nurses notes.  She was hemodynamically stable and she converted to NSR.  This was the second episode in 4 days.  However, she denies any symptoms.  The patient denies any new symptoms such as chest discomfort, neck or arm discomfort. There has been no new shortness of breath, PND or orthopnea. There have been no reported palpitations, presyncope or syncope.  She is going home tomorrow.     Past Medical History:  Diagnosis Date  . Anemia   . Arthritis   . Asthma   . Atrial fibrillation ([place])   . Atrial flutter [facility])    TEE/DCCV December 2013 - on Xarelto [facility])  . Bone spur of toe    Right 5th toe  . Chronic diastolic CHF (congestive heart failure) (HCC)    a. EF 50-55% by echo in 2015  . CKD (chronic kidney disease) stage 4, GFR 15-29 ml/min (HCC)    a. s/p fistula placement but not on HD.  Followed by Nephrology.   . Colon polyposis   . COPD (chronic obstructive pulmonary disease) ([place])   . DVT of upper extremity (deep vein thrombosis) ([place])    Right basilic vein, June 6213  . Essential hypertension, benign   . Fractures   . Glaucoma   . SLE (systemic lupus erythematosus) ([place])   . Type 2 diabetes mellitus ([place])   . UTI (lower urinary tract infection)   . Wound of right leg 06/2017    Past Surgical History:  Procedure Laterality Date  . ABDOMINAL AORTOGRAM W/LOWER EXTREMITY N/A 12/18/2017   Procedure: ABDOMINAL AORTOGRAM W/LOWER EXTREMITY;  Surgeon: Elam Dutch, MD;  Location: [place] CV [facility];  Service: Cardiovascular;  Laterality: N/A;  bilateral  . ABDOMINAL HYSTERECTOMY  1983  . AMPUTATION Right 06/08/2018   Procedure: RIGHT ABOVE KNEE AMPUTATION;  Surgeon: Elam Dutch, MD;  Location: [place];  Service: Vascular;  Laterality: Right;  . ANKLE SURGERY  1993  . AV FISTULA PLACEMENT Left 03/27/2014   Procedure: ARTERIOVENOUS (AV) FISTULA CREATION;  Surgeon: Rosetta Posner, MD;  Location: [place];  Service: Vascular;  Laterality: Left;  . BACK SURGERY  1980  . BASCILIC VEIN TRANSPOSITION Left 01/13/2018   Procedure: LEFT BASILIC VEIN TRANSPOSITION SECOND STAGE;  Surgeon: Elam Dutch, MD;  Location: [place];  Service: Vascular;  Laterality: Left;  . CARDIOVASCULAR STRESS TEST  12/19/2009   no stress induced rhythm abnormalities, ekg negative for ischemia  . CARDIOVERSION  09/16/2012   Procedure: CARDIOVERSION;  Surgeon: Sanda Klein, MD;  Location: [facility];  Service: Cardiovascular;  Laterality: N/A;  . CATARACT EXTRACTION    . COLONOSCOPY  09/20/2012   Dr. [name]: multiple tubular adenomas   . COLONOSCOPY  2005   Dr. [name]. Polyps, path unknown   . COLONOSCOPY N/A 07/24/2016   Procedure: COLONOSCOPY;  Surgeon: [name], MD;  [facility];  Service: Endoscopy;  Laterality: N/A;  9:00 am  . ESOPHAGOGASTRODUODENOSCOPY  09/20/2012   Dr.  [name]: duodenal erosion and possible resolving ulcer at the angularis   . ESOPHAGOGASTRODUODENOSCOPY N/A 03/24/2018   Procedure: ESOPHAGOGASTRODUODENOSCOPY (EGD);  Surgeon: [name], MD;  [facility];  Service: Endoscopy;  Laterality: N/A;  12:45pm- moved up to arrive at 8:30am per Tretha Sciara  . EYE SURGERY    . IR FLUORO GUIDE CV LINE RIGHT  11/09/2017  . IR US GUIDE VASC ACCESS RIGHT  11/09/2017  . REVISON OF ARTERIOVENOUS FISTULA Left 03/15/2018   Procedure: REVISION OF Left arm BASILIC VEIN TRANSPOSITION;  Surgeon: Angelia Mould, MD;  Location: [place];  Service: Vascular;  Laterality: Left;  . TEE WITHOUT CARDIOVERSION  09/16/2012   Procedure: TRANSESOPHAGEAL ECHOCARDIOGRAM (TEE);  Surgeon: Sanda Klein, MD;  Location: [facility] ENDOSCOPY;  Service: Cardiovascular;  Laterality: N/A;  . TRANSESOPHAGEAL ECHOCARDIOGRAM WITH CARDIOVERSION  09/16/2012   EF 60-65%, moderate LVH, moderate regurg of the aortic valve, LA moderately dilated      Inpatient Medications: Scheduled Meds: . allopurinol  100 mg Oral Q M,W,F  . apixaban  5 mg Oral BID  . atorvastatin  20 mg Oral QHS  . brimonidine  1 drop Left Eye BID   And  . timolol  1 drop Left Eye BID  . Chlorhexidine Gluconate Cloth  6 each Topical Q0600  . darbepoetin (ARANESP) injection - DIALYSIS  200 mcg Intravenous Q Thu-HD  . darbepoetin (ARANESP) injection - DIALYSIS  200 mcg Intravenous Once  . dicyclomine  10 mg Oral TID AC & HS  . docusate sodium  100 mg Oral Daily  . feeding supplement (NEPRO CARB STEADY)  237 mL Oral TID WC  . feeding supplement (PRO-STAT SUGAR FREE 64)  30 mL Oral BID  . fentaNYL  50 mcg Transdermal Q72H  . hydroxychloroquine  200 mg Oral Daily  . insulin aspart  0-10 Units Subcutaneous TID WC  . insulin aspart  0-5 Units Subcutaneous QHS  . insulin detemir  5 Units Subcutaneous Daily  . latanoprost  1 drop Both Eyes QHS  . metoprolol tartrate  12.5 mg Oral 2 times per day on Sun Mon Wed Fri    . multivitamin  1 tablet Oral QHS  . pantoprazole  40 mg Oral Daily  . predniSONE  5 mg Oral Q breakfast   Continuous Infusions: . sodium chloride    . sodium chloride     PRN Meds: sodium chloride, sodium chloride, acetaminophen, albuterol, alteplase, bisacodyl, diphenhydrAMINE, guaiFENesin-dextromethorphan, heparin, heparin, heparin, lidocaine (PF), lidocaine-prilocaine, oxyCODONE-acetaminophen, pentafluoroprop-tetrafluoroeth, phenol, polyethylene glycol, prochlorperazine **OR** prochlorperazine **OR** prochlorperazine, traZODone  Allergies:    Allergies  Allergen Reactions  . Ace Inhibitors Cough    Social History:   Social History   Socioeconomic History  . Marital status: Married    Spouse name: Not on file  . Number of children: Not  on file  . Years of education: Not on file  . Highest education level: Not on file  Occupational History  . Not on file  Social Needs  . Financial resource strain: Not on file  . Food insecurity:    Worry: Not on file    Inability: Not on file  . Transportation needs:    Medical: Not on file    Non-medical: Not on file  Tobacco Use  . Smoking status: Former Smoker    Packs/day: 1.00    Years: 30.00    Pack years: 30.00    Types: Cigarettes    Last attempt to quit: 08/29/2012    Years since quitting: 5.8  . Smokeless tobacco: Never Used  . Tobacco comment: quit 08-2012  Substance and Sexual Activity  . Alcohol use: No    Alcohol/week: 0.0 standard drinks  . Drug use: No  . Sexual activity: Yes    Partners: Male  Lifestyle  . Physical activity:    Days per week: Not on file    Minutes per session: Not on file  . Stress: Not on file  Relationships  . Social connections:    Talks on phone: Not on file    Gets together: Not on file    Attends religious service: Not on file    Active member of club or organization: Not on file    Attends meetings of clubs or organizations: Not on file    Relationship status: Not on file  .  Intimate partner violence:    Fear of current or ex partner: Not on file    Emotionally abused: Not on file    Physically abused: Not on file    Forced sexual activity: Not on file  Other Topics Concern  . Not on file  Social History Narrative  . Not on file    Family History:    Family History  Problem Relation Age of Onset  . Diabetes Brother   . Diabetes Father   . Hypertension Father   . Hypertension Sister   . Stroke Mother   . Diabetes Sister   . Hypertension Brother   . Colon cancer Neg Hx      ROS:  Please see the history of present illness.  ROS  All other ROS reviewed and negative.     Physical Exam/Data:   Vitals:   06/22/18 2214 06/23/18 0446 06/23/18 0500 06/23/18 1412  BP: (!) 130/40 (!) 150/45  (!) 125/46  Pulse: 88 81  71  Resp: 16 16  17   Temp: 98.1 F (36.7 C) 98.1 F (36.7 C)  97.9 F (36.6 C)  TempSrc: Oral Oral  Oral  SpO2: 100% 98%  93%  Weight:   68.9 kg   Height:        Intake/Output Summary (Last 24 hours) at 06/23/2018 1734 Last data filed at 06/22/2018 1820 Gross per 24 hour  Intake -  Output 2000 ml  Net -2000 ml   Filed Weights   06/22/18 1445 06/22/18 1820 06/23/18 0500  Weight: 65.6 kg 63.7 kg 68.9 kg   Body mass index is 26.07 kg/m.  GENERAL:  Well appearing HEENT:   Pupils equal round and reactive, fundi not visualized, oral mucosa unremarkable NECK:  No  jugular venous distention, waveform within normal limits, carotid upstroke brisk and symmetric, no bruits, no thyromegaly LYMPHATICS:  No cervical, inguinal adenopathy LUNGS:   Clear to auscultation bilaterally BACK:  No CVA tenderness CHEST:  Dialysis catheter in place  HEART:  PMI not displaced or sustained,S1 and S2 within normal limits, no S3, no S4, no clicks, no rubs, no murmurs ABD:  Flat, positive bowel sounds normal in frequency in pitch, no bruits, no rebound, no guarding, no midline pulsatile mass, no hepatomegaly, no splenomegaly EXT:  2 plus pulses  throughout, no  edema, no cyanosis no clubbing. Right AKA SKIN:  No rashes no nodules. Left upper arm fistula NEURO:   Cranial nerves II through XII grossly intact, motor grossly intact throughout PSYCH:    Cognitively intact, oriented to person place and time   EKG:  The EKG was personally reviewed and demonstrates:  NSR, rate 84, LVH, no acute ST T wave changes.  06/23/2018  Telemetry:  Telemetry was personally reviewed and demonstrates:  NA  Relevant CV Studies: NA  Laboratory Data:  Chemistry Recent Labs  Lab 06/17/18 1403 06/19/18 1417 06/22/18 1557  NA 129* 132* 131*  K 3.9 4.0 4.6  CL 91* 95* 94*  CO2 23 25 26   GLUCOSE 161* 102* 79  BUN 59* 64* 90*  CREATININE 4.70* 4.59* 5.89*  CALCIUM 8.0* 8.1* 8.6*  GFRNONAA 8* 9* 6*  GFRAA 10* 10* 7*  ANIONGAP 15 12 11     Recent Labs  Lab 06/17/18 1403 06/19/18 1417 06/22/18 1557  ALBUMIN 1.8* 1.8* 1.8*   Hematology Recent Labs  Lab 06/17/18 1403 06/19/18 1416 06/22/18 1557  WBC 10.6* 9.1 9.1  RBC 3.17* 3.16* 3.07*  HGB 8.5* 8.5* 8.2*  HCT 30.0* 29.6* 28.5*  MCV 94.6 93.7 92.8  MCH 26.8 26.9 26.7  MCHC 28.3* 28.7* 28.8*  RDW 17.5* 17.1* 16.5*  PLT 275 235 226   Cardiac EnzymesNo results for input(s): TROPONINI in the last 168 hours. No results for input(s): TROPIPOC in the last 168 hours.  BNPNo results for input(s): BNP, PROBNP in the last 168 hours.  DDimer No results for input(s): DDIMER in the last 168 hours.  Radiology/Studies:  No results found.  Assessment and Plan:   ARRHYTHMIA:  The patient has a history of fib/flutter.   She is on Eliquis.  She does not tolerate her beta blocker apparently on dialysis days because of hypotension.  She has no symptoms.  Since the are short lived and apparently infrequent I would not suggest any change in therapy.  We can plan an out patient event monitor to judge the frequency after she gets home and then follow up with Dr. Domenic Polite.   HTN:  BP controlled  currently.    AI:  This has been moderate on echo.  This will be followed clinically.  CKD:  Per renal   For questions or updates, please contact [place] Please consult [email or web address] for contact info under Cardiology/STEMI.   Signed, Minus Breeding, MD  06/23/2018 5:34 PM

## 2018-06-23 NOTE — Progress Notes (Addendum)
[facility]IL[facility]     PROGRESS NOTE  Subjective/Complaints:  Patient seen sitting up in bed this morning.  She states she slept well overnight, confirmed by sleep chart.  Discussed with nursing and chart reviewed regarding episode of tachycardia overnight, EKG reviewed, noted to be in SVT.  Per notes, she returned back to baseline relatively quickly, this morning she denies issues.  ROS: Denies CP, SOB, N/V/D.  Objective: Vital Signs: Blood pressure (!) 150/45, pulse 81, temperature 98.1 F (36.7 C), temperature source Oral, resp. rate 16, height 5\' 4"  (1.626 m), weight 68.9 kg, SpO2 98 %. No results found. Recent Labs    06/22/18 1557  WBC 9.1  HGB 8.2*  HCT 28.5*  PLT 226   Recent Labs    06/22/18 1557  NA 131*  K 4.6  CL 94*  GLUCOSE 79  BUN 90*  CREATININE 5.89*  CALCIUM 8.6*   CBG (last 3)  Recent Labs    06/22/18 2217 06/23/18 0016 06/23/18 0639  GLUCAP 225* 270* 122*    Wt Readings from Last 3 Encounters:  06/23/18 68.9 kg  06/10/18 65.4 kg  06/04/18 79.4 kg    Physical Exam:  BP (!) 150/45 (BP Location: Right Arm)   Pulse 81   Temp 98.1 F (36.7 C) (Oral)   Resp 16   Ht 5\' 4"  (1.626 m)   Wt 68.9 kg   SpO2 98%   BMI 26.07 kg/m  Constitutional: She has sickly appearance. NAD. HENT: normocephalic. Atraumatic Eyes: disconjugate gaze Cardiac: RRR no JVD. Lungs: Clear.  Unlabored. Abdomen: Positive bowel sounds, nondistended Musculoskeletal: Right AKA Neurologic: Alert and oriented Motor:  4/5 left hip flexors, knee extensors, ankle dorsiflexor, unchanged 4+/5 right hip flexion, unchanged Skin: Right AKA with shrinker C/D/I Left foot with dry gangrene  Assessment/Plan: 1. Functional deficits secondary to right AKA with left lower extremity dry gangrene which require 3+ hours per day of interdisciplinary therapy in a comprehensive inpatient rehab setting. Physiatrist is providing close team supervision and 24 hour  management of active medical problems listed below. Physiatrist and rehab team continue to assess barriers to discharge/monitor patient progress toward functional and medical goals.  Function:  Bathing Bathing position   Position: Other (comment)(sitting on BSC)  Bathing parts Body parts bathed by patient: Right arm, Left arm, Chest, Abdomen, Right upper leg, Left upper leg, Front perineal area, Buttocks Body parts bathed by helper: Back  Bathing assist Assist Level: Supervision or verbal cues   Set up : To obtain items  Upper Body Dressing/Undressing Upper body dressing   What is the patient wearing?: Pull over shirt/dress Bra - Perfomed by patient: Thread/unthread right bra strap, Thread/unthread left bra strap, Hook/unhook bra (pull down sports bra) Bra - Perfomed by helper: Hook/unhook bra (pull down sports bra) Pull over shirt/dress - Perfomed by patient: Thread/unthread right sleeve, Thread/unthread left sleeve, Put head through opening, Pull shirt over trunk          Upper body assist Assist Level: Supervision or verbal cues      Lower Body Dressing/Undressing Lower body dressing   What is the patient wearing?: Pants     Pants- Performed by patient: Thread/unthread right pants leg Pants- Performed by helper: Thread/unthread left pants leg, Pull pants up/down                      Lower body assist Assist for lower body dressing: Supervision or verbal cues  Toileting Toileting   Toileting steps completed by patient: Performs perineal hygiene Toileting steps completed by helper: Adjust clothing prior to toileting, Adjust clothing after toileting Toileting Assistive Devices: Grab bar or rail(walker)  Toileting assist Assist level: Supervision or verbal cues   Transfers Chair/bed transfer   Chair/bed transfer method: Lateral scoot Chair/bed transfer assist level: Touching or steadying assistance (Pt > 75%) Chair/bed transfer assistive device: Armrests,  Sliding board     Locomotion Ambulation Ambulation activity did not occur: Safety/medical concerns         Wheelchair   Type: Manual Max wheelchair distance: 100 Assist Level: Supervision or verbal cues  Cognition Comprehension Comprehension assist level: Understands basic 90% of the time/cues < 10% of the time  Expression Expression assist level: Expresses basic 90% of the time/requires cueing < 10% of the time.  Social Interaction Social Interaction assist level: Interacts appropriately 90% of the time - Needs monitoring or encouragement for participation or interaction.  Problem Solving Problem solving assist level: Solves basic 75 - 89% of the time/requires cueing 10 - 24% of the time  Memory Memory assist level: Recognizes or recalls 75 - 89% of the time/requires cueing 10 - 24% of the time    Medical Problem List and Plan: 1. Decline in functional mobility and ADLs  secondary to Right Above knee amputation  Plan for d/c tomorrow  Will see patient for transitional care management in 1-2 weeks post-discharge  Stump shrinker ordered 2.H/oDVT/ A fib/anticoagulation:Pharmaceutical:Other (comment)--Eliquis. 3.ChronicPain Management:On fentanyl 50 mcg TD (100 mcg PTA?) with prn oxycodone.  4. Mood:LCSW to follow for evaluation and support. Seems to be accepting of thedecision forR-AKA.  5. Neuropsych: This patientis not fullycapable of making decisions onherown behalf. 6. Skin/Wound Care:Continue nephro/prostat supplements to promote healing. Monitor wounds for healing.   Patient will likely require left lower extremity amputation future 7. Fluids/Electrolytes/Nutrition:Daily weights with strict I/O. Labs with HD. 8. T2DM with neuropathy and retionpathy, coundounded by steroids:   Was on levemir 10 units daily--resumed 5 units daily, increased to 10 units on 9/16, increased to 13 units on 9/20 decreased to 10 on 9/24   CBG (last 3)  Recent Labs     06/22/18 2217 06/23/18 0016 06/23/18 0639  GLUCAP 225* 270* 122*   Labile on 9/25 9. PVD with left foot wound: Question due to calciphylaxis v/s pyoderma gangrenosum.  10. A fib/Aflutter:Monitor heart rate twice daily. On Eliquis and beta-blocker twice daily, no extra metoprolol today due to HD,   Will ask cardiology to reeval prior to discharge 11. SLE: Continue plaquenil and low dose prednisone.  12. ESRD: Schedule HD on TTS at end of day to help with tolerance of therapy.  13. Anemia of chronic disease:Arransep weekly started on 9/12.   Hemoglobin 8.2 on 9/24  Labs with HD  Continue to monitor 14. H/o Glaucoma: Stable on Timpotic and Xalatan.  15. H/o gastritis/erosive gastropathy: Per EGD 6/26. Continue PPI. Serial H/H monitored with HD. 16. Elevated blood pressure Vitals:   06/22/18 2214 06/23/18 0446  BP: (!) 130/40 (!) 150/45  Pulse: 88 81  Resp: 16 16  Temp: 98.1 F (36.7 C) 98.1 F (36.7 C)  SpO2: 100% 98%   Labile on 9/25, management per nephro 17. Leukocytosis: resolved  Afebrile  Cont to monitor  LOS (Days) 13 A FACE TO FACE EVALUATION WAS PERFORMED  Ankit Lorie Phenix 06/23/2018 8:14 AM

## 2018-06-23 NOTE — Progress Notes (Signed)
Social Work Patient ID: [name], female   DOB: [birth year], 72 y.o.   MRN: [id number]  Spoke with Sheila-HD to place pt on first run tomorrow due to discharging home after HD. Prepare pt for discharge tomorrow.

## 2018-06-23 NOTE — Patient Care Conference (Signed)
Inpatient RehabilitationTeam Conference and Plan of Care Update Date: 06/23/2018   Time: 2:15 PM    Patient Name: [name]      Medical Record Number: [id number]  Date of Birth: 06/10/1946 Sex: Female         Room/Bed: 4M02C/4M02C-01 Payor Info: Payor: [employer] MEDICARE / Plan: UHC MEDICARE / Product Type: *No Product type* /    Admitting Diagnosis: R AKA  Admit Date/Time:  06/10/2018  7:00 PM Admission Comments: No comment available   Primary Diagnosis:  <principal problem not specified> Principal Problem: <principal problem not specified>  Patient Active Problem List   Diagnosis Date Noted  . Labile blood glucose   . SVT (supraventricular tachycardia) ([place])   . Hypoglycemia   . Hypertensive crisis   . Labile blood pressure   . ESRD on dialysis ([place])   . Steroid-induced hyperglycemia   . Elevated blood pressure reading   . Type II diabetes mellitus with peripheral autonomic neuropathy (HCC)   . Anemia of chronic disease   . Prediabetes   . Postoperative pain   . Ischemic leg pain   . Unilateral AKA, right ([place])   . Post-operative pain   . Diabetic retinopathy associated with type 2 diabetes mellitus ([place])   . Diabetic nephropathy associated with type 2 diabetes mellitus ([place])   . Leukocytosis   . Non-healing wound of lower extremity 06/08/2018  . Malnutrition of moderate degree 03/03/2018  . Leg wound, right 03/01/2018  . Ulcer of right calf ([place]) 02/10/2018  . Chronic RLQ pain 01/11/2018  . Ischemic ulcer of toes on both feet ([place])   . Cellulitis in diabetic foot ([place]) 12/14/2017  . Atrial fibrillation ([place]) 12/14/2017  . Glaucoma 12/14/2017  . ESRD (end stage renal disease) on dialysis ([place]) 11/16/2017  . COPD with acute exacerbation ([place]) 11/03/2017  . Community acquired pneumonia 11/02/2017  . Nausea and vomiting 11/02/2017  . Iron deficiency anemia 08/07/2017  . Heme positive stool 10/04/2015  . Mixed hyperlipidemia 06/29/2015  . Type 2  diabetes mellitus with stage 4 chronic kidney disease, with long-term current use of insulin ([place]) 06/29/2015  . Chronic kidney disease, stage V ([place]) 04/12/2014  . 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 Acute respiratory failure with hypoxia ([place]) 03/15/2014  . Chronic diastolic heart failure ([place]) 03/15/2014  . Anemia of chronic renal failure, stage 4 (severe) ([place]) 02/23/2014  . Folate deficiency 02/17/2014  . Aortic regurgitation 09/19/2012  . Class 2 severe obesity due to excess calories with serious comorbidity and body mass index (BMI) of 35.0 to 35.9 in adult ([place]) 09/14/2012  . Type 2 diabetes mellitus with diabetic neuropathy ([place]) 09/14/2012  . SLE (systemic lupus erythematosus) ([place]) 09/14/2012  . Essential hypertension, benign 09/14/2012  . COPD (chronic obstructive pulmonary disease) ([place]) 09/14/2012    Expected Discharge Date: Expected Discharge Date: 06/24/18  Team Members Present: Physician leading conference: Dr. Delice Lesch Social Worker Present: Ovidio Kin, LCSW PT Present: Kem Parkinson, PT OT Present: Napoleon Form, OT SLP Present: Windell Moulding, SLP PPS Coordinator present : Daiva Nakayama, RN, CRRN     Current Status/Progress Goal Weekly Team Focus  Medical   1.Decline in functional mobility and ADLssecondary to Right Above knee amputation  Improve mobility, transfers, DM, cardiac issues  See above   Bowel/Bladder   cont b/b; oliguria; HD (T,Th,Sat); lbm 9/22  maintain cont  assess q shift and prn; admin scheduled/prn stool softener/laxatives   Swallow/Nutrition/ Hydration  ADL's   ADLs seated on padded tub bench with lateral leans, functional transfers EOB <> w/c <> tub transfer with slide board; toileting while seated on tub transfer bench   overall supervision; max A LB dressing; min A functional transfers  d/c 9/26   Mobility   minA car and recliner transfers, S w/c propulsion  bed  <>recliner and w/c <>car with minA, S w/c propulsion; bed mobility/transfer goal d/c as pt sleeps in recliner  pt/family education, strengthening, endurance   Communication             Safety/Cognition/ Behavioral Observations            Pain   c/o pain to R AKA and L foot; percocet and tylenol prn  <3  assess q shift and prn   Skin   AKA to RLE; necrosis to L foot  free from infection and breakdown with mod assist  assess q shift and prn      *See Care Plan and progress notes for long and short-term goals.     Barriers to Discharge  Current Status/Progress Possible Resolutions Date Resolved   Physician    Medical stability;Wound Care;Weight bearing restrictions     See above  Therapies, optimize DM meds, monitor wounds, Cards consult      Nursing                  PT                    OT                  SLP                SW                Discharge Planning/Teaching Needs:  Husband here for family training with therapy team. Can provide 24 hr care at home. Niece will continue to assist wiht ADL's.      Team Discussion:  Reaching goals of min assist with sliding board. Left groin pain-MD aware of. Cardiology to see prior to DC home. Husband here Monday for education and learn pt's care. HD T-TH & Sat on first run for tomorrow prior to DC.   Revisions to Treatment Plan:  DC 9/26    Continued Need for Acute Rehabilitation Level of Care: The patient requires daily medical management by a physician with specialized training in physical medicine and rehabilitation for the following conditions: Daily direction of a multidisciplinary physical rehabilitation program to ensure safe treatment while eliciting the highest outcome that is of practical value to the patient.: Yes Daily medical management of patient stability for increased activity during participation in an intensive rehabilitation regime.: Yes Daily analysis of laboratory values and/or radiology reports with any  subsequent need for medication adjustment of medical intervention for : Diabetes problems;Wound care problems;Post surgical problems;Other   I attest that I was present, lead the team conference, and concur with the assessment and plan of the team.   Elease Hashimoto 06/23/2018, 3:07 PM

## 2018-06-23 NOTE — Progress Notes (Signed)
Social Work Patient ID: [name], female   DOB: Feb 04, 1946, 72 y.o.   MRN: [id number] Met with pt and husband who was here visiting made aware pt will be on first run for HD and ready around 1:00pm for discharge. husband plans to take the equipment in the room home today and get set up. Pt glad to be going home tomorrow. Aware of team conference. See in am for any last minute questions.

## 2018-06-23 NOTE — Progress Notes (Signed)
Social Work  Discharge Note  The overall goal for the admission was met for:   Discharge location: Yes-HOME WITH HUSBAND WHO CAN PROVIDE 24 HR CARE  Length of Stay: Yes-14 DAYS  Discharge activity level: Yes-MIN WITH TRANSFERS [place]  Home/community participation: Yes  Services provided included: MD, RD, PT, OT, SLP, RN, CM, Pharmacy and SW  Financial Services: Private Insurance: [employer]  Follow-up services arranged: Home Health: [facility]-PT,OT,RN, DME: [facility]-PADDED TUB BENCH WITH CUT OUT & 30 TRANSFER BOARD and Patient/Family request agency HH: [place], DME: NO PREF  Comments (or additional information):HUSBAND WAS HERE FOR FAMILY TRAINING AND FEELS COMFORTABLE WITH WIFE'S CARE. WAS ASSISTING PRIOR TO ADMISSION.  Patient/Family verbalized understanding of follow-up arrangements: Yes  Individual responsible for coordination of the follow-up plan: WILLIE-HUSBAND  Confirmed correct DME delivered: Elease Hashimoto 06/23/2018    Elease Hashimoto

## 2018-06-23 NOTE — Progress Notes (Addendum)
Occupational Therapy Discharge Summary  Patient Details  Name: [name] MRN: [id number] Date of Birth: [birth year]  Today's Date: 06/23/2018   Patient has met 8 of 8 long term goals due to improved activity tolerance, improved balance and ability to compensate for deficits.  Patient to discharge at overall supervision-min A level however requires max A for LB dressing and min A for functional transfers with slide board.  Patient's care partner is independent to provide the necessary physical assistance at discharge. Pt is safe to d/c home with care from her husband. Pt and her husband have participated in hands on and educational sessions regarding slide board transfers to/from padded tub transfer bench <> recliner <> w/c with education provided regarding varying assistance levels at d/c with transfers due to time of day and whether pt has had dialysis that day. Pt performing toileting and bathing on padded tub bench with cut out due to pt's level of abilities. Pt and husband have also been educated on lateral leans when pulling up pants/toileting with pt performing lateral leans throughout therapy sessions and demonstrating increased improvement over time. Hygiene/wearing schedule as well as donning/doffing of the shrinker has also been completed with pt and husband.    Recommendation:  Patient will benefit from ongoing skilled OT services in home health setting to continue to advance functional skills in the area of BADL.  Equipment: Padded tub transfer bench   Reasons for discharge: treatment goals met and discharge from hospital  Patient/family agrees with progress made and goals achieved: Yes  OT Discharge Precautions/Restrictions  Restrictions Weight Bearing Restrictions: Yes RLE Weight Bearing: Non weight bearing(L LE ) Other Position/Activity Restrictions: no weight through left LE due to wounds on foot Vision Baseline Vision/History: Wears glasses Wears Glasses: Reading  only Patient Visual Report: No change from baseline Perception  Perception: Within Functional Limits Praxis Praxis: Intact Cognition Overall Cognitive Status: Within Functional Limits for tasks assessed Arousal/Alertness: Awake/alert Orientation Level: Oriented X4 Attention: Selective Selective Attention: Appears intact Memory: Appears intact Awareness: Appears intact Problem Solving: Appears intact Safety/Judgment: Appears intact Sensation Sensation Light Touch: Impaired Detail Peripheral sensation comments: decreased/absent in distal LLE Light Touch Impaired Details: Impaired LLE Coordination Gross Motor Movements are Fluid and Coordinated: Yes Fine Motor Movements are Fluid and Coordinated: Yes Motor  Motor Motor - Discharge Observations: Pt with generalized weakness however improvements made over LOS  Trunk/Postural Assessment  Cervical Assessment Cervical Assessment: Within Functional Limits Thoracic Assessment Thoracic Assessment: Within Functional Limits Lumbar Assessment Lumbar Assessment: Exceptions to WFL(posterior pelvic tilt ) Postural Control Postural Control: Within Functional Limits  Balance Balance Balance Assessed: Yes Static Sitting Balance Static Sitting - Balance Support: No upper extremity supported Static Sitting - Level of Assistance: 6: Modified independent (Device/Increase time) Dynamic Sitting Balance Dynamic Sitting - Balance Support: Feet unsupported;No upper extremity supported Dynamic Sitting - Level of Assistance: 5: Stand by assistance Dynamic Sitting - Balance Activities: Lateral lean/weight shifting Sitting balance - Comments: sitting EOB/tub transfer bench for self care tasks  Extremity/Trunk Assessment RUE Assessment RUE Assessment: Within Functional Limits LUE Assessment LUE Assessment: Within Functional Limits   See Function Navigator for Current Functional Status.  [name] 06/23/2018, 12:47 PM

## 2018-06-23 NOTE — Progress Notes (Signed)
[facility] Progress Note   Subjective:  Episode of SVT overnight  Stable today No c/os   Objective Vitals:   06/22/18 2033 06/22/18 2214 06/23/18 0446 06/23/18 0500  BP: (!) 96/59 (!) 130/40 (!) 150/45   Pulse: (!) 105 88 81   Resp: 16 16 16    Temp: 98.4 F (36.9 C) 98.1 F (36.7 C) 98.1 F (36.7 C)   TempSrc: Oral Oral Oral   SpO2: 100% 100% 98%   Weight:    68.9 kg  Height:       Physical Exam General: Pleasant, elderly female NAD Heart: Z6,X0, 2/6 systolic M. Lungs: CTAB Abdomen: S, NT Extremities: No LLE edema. R AKA stump in wrap Dialysis Access: LUA AVF +bruit pulsitile RIJ TDC Drsg CDI.    Additional Objective Labs: Basic Metabolic Panel: Recent Labs  Lab 06/17/18 1403 06/19/18 1417 06/22/18 1557  NA 129* 132* 131*  K 3.9 4.0 4.6  CL 91* 95* 94*  CO2 23 25 26   GLUCOSE 161* 102* 79  BUN 59* 64* 90*  CREATININE 4.70* 4.59* 5.89*  CALCIUM 8.0* 8.1* 8.6*  PHOS 4.8* 4.2 4.5   Liver Function Tests: Recent Labs  Lab 06/17/18 1403 06/19/18 1417 06/22/18 1557  ALBUMIN 1.8* 1.8* 1.8*   No results for input(s): LIPASE, AMYLASE in the last 168 hours. CBC: Recent Labs  Lab 06/17/18 1403 06/19/18 1416 06/22/18 1557  WBC 10.6* 9.1 9.1  HGB 8.5* 8.5* 8.2*  HCT 30.0* 29.6* 28.5*  MCV 94.6 93.7 92.8  PLT 275 235 226   Blood Culture    Component Value Date/Time   SDES BLOOD RIGHT ANTECUBITAL 01/16/2018 1800   SPECREQUEST  01/16/2018 1800    BOTTLES DRAWN AEROBIC AND ANAEROBIC Blood Culture adequate volume   CULT  01/16/2018 1800    NO GROWTH 5 DAYS Performed at [facility], [place] [address], [place], [place] [address]    REPTSTATUS 01/21/2018 FINAL 01/16/2018 1800    Cardiac Enzymes: No results for input(s): CKTOTAL, CKMB, CKMBINDEX, TROPONINI in the last 168 hours. CBG: Recent Labs  Lab 06/22/18 1430 06/22/18 2003 06/22/18 2217 06/23/18 0016 06/23/18 0639  GLUCAP 68* 183* 225* 270* 122*   Iron Studies:  Recent  Labs    06/23/18 0546  IRON 23*  TIBC 154*  FERRITIN 795*   @lablastinr3 @ Studies/Results: No results found. Medications: . sodium chloride    . sodium chloride     . allopurinol  100 mg Oral Q M,W,F  . apixaban  5 mg Oral BID  . atorvastatin  20 mg Oral QHS  . brimonidine  1 drop Left Eye BID   And  . timolol  1 drop Left Eye BID  . Chlorhexidine Gluconate Cloth  6 each Topical Q0600  . darbepoetin (ARANESP) injection - DIALYSIS  200 mcg Intravenous Q Thu-HD  . darbepoetin (ARANESP) injection - DIALYSIS  200 mcg Intravenous Once  . dicyclomine  10 mg Oral TID AC & HS  . docusate sodium  100 mg Oral Daily  . feeding supplement (NEPRO CARB STEADY)  237 mL Oral TID WC  . feeding supplement (PRO-STAT SUGAR FREE 64)  30 mL Oral BID  . fentaNYL  50 mcg Transdermal Q72H  . hydroxychloroquine  200 mg Oral Daily  . insulin aspart  0-10 Units Subcutaneous TID WC  . insulin aspart  0-5 Units Subcutaneous QHS  . insulin detemir  5 Units Subcutaneous Daily  . latanoprost  1 drop Both Eyes QHS  . metoprolol  tartrate  12.5 mg Oral 2 times per day on Sun Mon Wed Fri  . multivitamin  1 tablet Oral QHS  . pantoprazole  40 mg Oral Daily  . predniSONE  5 mg Oral Q breakfast     Dialysis Orders: TTS at [facility] 3.5h 69.5kg 2/2.5 bath AVF+ TDCHep 2000 - Venofer 50mg  IV weekly - Mircera 266mcg IV q 2 weeks (last given 9/5 - Hgb 7.4 at that time)  Assessment/Plan: 1. S/p R AKA 9/10(non-healingwounds):on CIR -continues to do well with PT/OT. 2. ESRD:Continue TTS schedule. Next HD 9/26  LUE 2nd stage BVT that has not matured adequately. Per Dr. Donzetta Matters will need fistulogram in the future  3. Hypertension/volume:BPcontrolled. Now well below OP EDW --> will be loweredon d/c. No excess volume on exam.  4. Anemia:Hgb8.5>8.2.  Aranesp 224mcg q Thursday. Tsat 15% >Will replete 5. Metabolic bone disease:Ca/Phos ok. No binders, VDRA yet.  6. Nutrition:Alb 1.8continue Nepro and  Pro-stat supps. 7. Type 2 DM: Per primary. 8. PVD, L foot wound:Perprimary  Lynnda Child PA-C [facility] Pager [phone] 06/23/2018,9:07 AM

## 2018-06-23 NOTE — Progress Notes (Signed)
Occupational Therapy Session Note  Patient Details  Name: [name] MRN: [id number] Date of Birth: 01-26-1946  Today's Date: 06/23/2018 OT Individual Time: 1330-1400 OT Individual Time Calculation (min): 30 min    Short Term Goals: Week 2:  OT Short Term Goal 1 (Week 2): STG=LTG due to LOS  Skilled Therapeutic Interventions/Progress Updates:    Pt seen for OT session focusing on functional transfers, education, and hands on caregiver training. Pt sitting up in w/c upon arrival with husband present, pt agreeable to tx session and denying pain. [name] present and with questions regarding pt's access to public restroom. Discussed feasibility of pt being able to fit w/c into public restroom, positioning of w/c in prep for sliding board transfer, time to get pt positioned and clothing management in order to have continent void. Recommend planning on voiding prior to leaving house and managing fluid intake in prep for community outing, pt and husband voiced understanding.  Hands on training with pt's husband assisting with w/c<> padded tub bench with cutout. Guarding assist following positioning of sliding board. Cont with education regarding body positioning and technique for caregiver assist and lateral leans for toileting/ LB clothing management. Both pt and caregiver feeling comfortable with planned d/c home tomorrow.   Therapy Documentation Precautions:  Precautions Precautions: Fall, Other (comment) Precaution Comments: R AKA, L foot wounds Restrictions Weight Bearing Restrictions: Yes RLE Weight Bearing: Non weight bearing(L LE ) Other Position/Activity Restrictions: no weight through left LE due to wounds on forefoot  See Function Navigator for Current Functional Status.   Therapy/Group: Individual Therapy  Serafina Topham L 06/23/2018, 2:04 PM

## 2018-06-23 NOTE — Progress Notes (Signed)
Physical Therapy Discharge Summary  Patient Details  Name: [name] MRN: [id number] Date of Birth: [birth year]  Today's Date: 06/23/2018 PT Individual Time: 1100-1200 PT Individual Time Calculation (min): 60 min    Patient has met 7 of 7 long term goals due to improved activity tolerance, improved balance, improved postural control, increased strength, functional use of  right upper extremity and left upper extremity and improved coordination.  Patient to discharge at a wheelchair level [place].   Patient's care partner is independent to provide the necessary physical and cognitive assistance at discharge.  Reasons goals not met: All goals met.  Recommendation:  Patient will benefit from ongoing skilled PT services in home health setting to continue to advance safe functional mobility, address ongoing impairments in strength, functional transfers, endurance, w/c mobility, and minimize fall risk.  Equipment: slideboard; recommended hospital bed however pt/husband declined  Reasons for discharge: treatment goals met and discharge from hospital  Patient/family agrees with progress made and goals achieved: Yes  PT Discharge Precautions/Restrictions Restrictions Weight Bearing Restrictions: Yes RLE Weight Bearing: Non weight bearing(L LE ) Other Position/Activity Restrictions: no weight through left LE due to wounds on forefoot Vital Signs Therapy Vitals Temp: 97.9 F (36.6 C) Temp Source: Oral Pulse Rate: 71 Resp: 17 BP: (!) 125/46 Patient Position (if appropriate): Sitting Oxygen Therapy SpO2: 93 % O2 Device: Room Air Vision/Perception  Perception Perception: Within Functional Limits Praxis Praxis: Intact  Cognition Overall Cognitive Status: Within Functional Limits for tasks assessed Arousal/Alertness: Awake/alert Orientation Level: Oriented X4 Attention: Selective Selective Attention: Appears intact Memory: Appears intact Awareness: Appears  intact Problem Solving: Appears intact Safety/Judgment: Appears intact Sensation Sensation Light Touch: Impaired Detail Peripheral sensation comments: decreased/absent in distal LLE Light Touch Impaired Details: Impaired LLE Coordination Gross Motor Movements are Fluid and Coordinated: Yes Fine Motor Movements are Fluid and Coordinated: Yes Motor  Motor Motor - Discharge Observations: generalized weakness  Mobility Bed Mobility Bed Mobility: Rolling Right;Rolling Left;Left Sidelying to Sit Rolling Right: Supervision/verbal cueing Rolling Left: Supervision/Verbal cueing Right Sidelying to Sit: Supervision/Verbal cueing Left Sidelying to Sit: Supervision/Verbal cueing Transfers Transfers: Lateral/Scoot Transfers Lateral/Scoot Transfers: Minimal Assistance - Patient > 75% Transfer (Assistive device): (sliding board) Locomotion  Gait Ambulation: No Gait Gait: No Stairs / Additional Locomotion Stairs: No Product manager Mobility: Yes Wheelchair Assistance: Chartered loss adjuster: Both upper extremities Wheelchair Parts Management: Supervision/cueing Distance: 138f  Trunk/Postural Assessment  Cervical Assessment Cervical Assessment: Within Functional Limits Thoracic Assessment Thoracic Assessment: Within Functional Limits Lumbar Assessment Lumbar Assessment: Exceptions to WFL(posterior pelvic tilt ) Postural Control Postural Control: Within Functional Limits  Balance Balance Balance Assessed: Yes Static Sitting Balance Static Sitting - Balance Support: No upper extremity supported Static Sitting - Level of Assistance: 6: Modified independent (Device/Increase time) Dynamic Sitting Balance Dynamic Sitting - Balance Support: Feet unsupported;No upper extremity supported Dynamic Sitting - Level of Assistance: 5: Stand by assistance Dynamic Sitting - Balance Activities: Lateral lean/weight shifting Sitting balance - Comments:  lateral lean/weight shifting for slidboard placement Extremity Assessment  RUE Assessment RUE Assessment: Within Functional Limits LUE Assessment LUE Assessment: Within Functional Limits RLE Strength Right Hip Flexion: 3+/5 Right Hip ABduction: 3/5 Right Hip ADduction: 4/5 LLE Assessment LLE Assessment: Exceptions to W[place] Stanley LLCLLE Strength Left Hip Flexion: 4+/5 Left Hip ABduction: 4+/5 Left Hip ADduction: 4/5 Left Knee Flexion: 4+/5 Left Knee Extension: 4+/5 Left Ankle Dorsiflexion: 3/5 Left Ankle Plantar Flexion: 3/5  Skilled Therapeutic Interventions: Pt received seated in w/c and agreeable to treatment. TotalA  w/c transport to simulated car for energy/time conservation. Functional w/c transfers to/from car, bed, and recliner require minA with verbal cues for sequencing and technique. Bed mobility and w/c propulsion require supervision. Assessed motor, strength, and mobility as mentioned above. Education and handout provided for when to don/doff shrinker and how to clean it and residual limb appropriately. Returned to room via self-propelling w/c transport with supervision. Husband's question regarding PT services after d/c were answered. PT left with no further questions/concerns regarding d/c home. Remained in w/c at end of session with husband present and all needs in reach.  See Function Navigator for Current Functional Status.  [place] , SPT 06/23/2018, 3:12 PM

## 2018-06-23 NOTE — Progress Notes (Signed)
Occupational Therapy Session Note  Patient Details  Name: [name] MRN: [id number] Date of Birth: Feb 02, 1946  Today's Date: 06/23/2018 OT Individual Time:  0845-1000 (75 mins)     Short Term Goals: Week 2:  OT Short Term Goal 1 (Week 2): STG=LTG due to LOS  Skilled Therapeutic Interventions/Progress Updates:    Pt supine in bed upon entry finishing breakfast, with reports of pain in L LE and R surgical wound site however pt not due for pain meds. Pt performed all functional transfers with slide board with guarding-min A dependent on fatigue. Pt transferred to padded tub transfer bench and performed all dressing and bathing tasks while seated utilizing lateral leans when needed for pulling up pants/bathing buttocks. Pt able to complete periarea care however therapist required to A for thoroughness. Throughout education and bathing tasks, pt able to recall information from previous sessions with prompts from therapist however pt required further education regarding slide board positioning and residual limb hygiene schedule. Pt transferred to w/c and left in room with all needs in reach.   Therapy Documentation Precautions:  Precautions Precautions: Fall, Other (comment) Precaution Comments: R AKA, L foot wounds Restrictions Weight Bearing Restrictions: Yes RLE Weight Bearing: Non weight bearing Other Position/Activity Restrictions: caution about trying to put weight through left LE due to wounds on forefoot  See Function Navigator for Current Functional Status.   Therapy/Group: Individual Therapy  Seleta Hovland 06/23/2018, 9:19 AM

## 2018-06-23 NOTE — Progress Notes (Signed)
Husband with questions about patient ongoing abdominal pain. She has had it for months and they do not have any answers. Chart reviewed and note that patient has had multiple abdominal CTs to work up pain. Has also seen GI doctor and on bentyl. He also has concerns about some vaginal bleeding that has been ongoing for the past year--he has had to cancel GYN appt due to hospitalizations.   Pain with palpation of panus as well as pressure RLQ. Question component of neuropathy. She reports the only thing that makes it better is pain medication. Recommended trying local measures to see if this would help. Also recommended follow up with GI and GYN for further input/workup.

## 2018-06-24 ENCOUNTER — Other Ambulatory Visit: Payer: Self-pay | Admitting: Medical

## 2018-06-24 DIAGNOSIS — Z23 Encounter for immunization: Secondary | ICD-10-CM | POA: Diagnosis not present

## 2018-06-24 DIAGNOSIS — I4892 Unspecified atrial flutter: Secondary | ICD-10-CM

## 2018-06-24 DIAGNOSIS — I471 Supraventricular tachycardia: Secondary | ICD-10-CM

## 2018-06-24 LAB — RENAL FUNCTION PANEL
Albumin: 1.9 g/dL — ABNORMAL LOW (ref 3.5–5.0)
Anion gap: 12 (ref 5–15)
BUN: 62 mg/dL — ABNORMAL HIGH (ref 8–23)
CHLORIDE: 94 mmol/L — AB (ref 98–111)
CO2: 24 mmol/L (ref 22–32)
CREATININE: 4.34 mg/dL — AB (ref 0.44–1.00)
Calcium: 8.3 mg/dL — ABNORMAL LOW (ref 8.9–10.3)
GFR calc Af Amer: 11 mL/min — ABNORMAL LOW (ref >60)
GFR calc non Af Amer: 9 mL/min — ABNORMAL LOW (ref >60)
Glucose, Bld: 121 mg/dL — ABNORMAL HIGH (ref 70–99)
POTASSIUM: 3.6 mmol/L (ref 3.5–5.1)
Phosphorus: 4.4 mg/dL (ref 2.5–4.6)
Sodium: 130 mmol/L — ABNORMAL LOW (ref 135–145)

## 2018-06-24 LAB — CBC WITH DIFFERENTIAL/PLATELET
Abs Immature Granulocytes: 0 10*3/uL (ref 0.0–0.1)
BASOS PCT: 1 %
Basophils Absolute: 0 10*3/uL (ref 0.0–0.1)
EOS ABS: 0.1 10*3/uL (ref 0.0–0.7)
Eosinophils Relative: 2 %
HEMATOCRIT: 28.2 % — AB (ref 36.0–46.0)
Hemoglobin: 8 g/dL — ABNORMAL LOW (ref 12.0–15.0)
Immature Granulocytes: 1 %
Lymphocytes Relative: 20 %
Lymphs Abs: 1.2 10*3/uL (ref 0.7–4.0)
MCH: 26.4 pg (ref 26.0–34.0)
MCHC: 28.4 g/dL — ABNORMAL LOW (ref 30.0–36.0)
MCV: 93.1 fL (ref 78.0–100.0)
MONO ABS: 0.7 10*3/uL (ref 0.1–1.0)
MONOS PCT: 12 %
Neutro Abs: 3.9 10*3/uL (ref 1.7–7.7)
Neutrophils Relative %: 64 %
PLATELETS: 258 10*3/uL (ref 150–400)
RBC: 3.03 MIL/uL — ABNORMAL LOW (ref 3.87–5.11)
RDW: 16.2 % — AB (ref 11.5–15.5)
WBC: 6 10*3/uL (ref 4.0–10.5)

## 2018-06-24 LAB — GLUCOSE, CAPILLARY
Glucose-Capillary: 97 mg/dL (ref 70–99)
Glucose-Capillary: 114 mg/dL — ABNORMAL HIGH (ref 70–99)

## 2018-06-24 MED ORDER — SODIUM CHLORIDE 0.9 % IV SOLN: 100.0000 mL | INTRAVENOUS | Status: DC | PRN

## 2018-06-24 MED ORDER — INSULIN DETEMIR 100 UNIT/ML FLEXPEN: 5.0000 [IU] | PEN_INJECTOR | Freq: Every morning | SUBCUTANEOUS | 11 refills | Status: DC

## 2018-06-24 MED ORDER — OXYCODONE-ACETAMINOPHEN 5-325 MG PO TABS: 1.0000 | ORAL_TABLET | Freq: Four times a day (QID) | ORAL | 0 refills | Status: DC | PRN

## 2018-06-24 MED ORDER — HEPARIN SODIUM (PORCINE) 1000 UNIT/ML IJ SOLN
INTRAMUSCULAR | Status: AC
  Filled 2018-06-24: qty 4

## 2018-06-24 MED ORDER — HEPARIN SODIUM (PORCINE) 1000 UNIT/ML DIALYSIS
1000.0000 [IU] | INTRAMUSCULAR | Status: DC | PRN
  Administered 2018-06-24: 1000 [IU] via INTRAVENOUS_CENTRAL
  Filled 2018-06-24 (×2): qty 1

## 2018-06-24 MED ORDER — ALTEPLASE 2 MG IJ SOLR: 2.0000 mg | Freq: Once | INTRAMUSCULAR | Status: DC | PRN

## 2018-06-24 MED ORDER — ACETAMINOPHEN 325 MG PO TABS: 325.0000 mg | ORAL_TABLET | ORAL | Status: AC | PRN

## 2018-06-24 MED ORDER — SODIUM CHLORIDE 0.9 % IV SOLN
125.0000 mg | INTRAVENOUS | Status: DC
  Administered 2018-06-24: 125 mg via INTRAVENOUS
  Filled 2018-06-24: qty 10

## 2018-06-24 MED ORDER — METOPROLOL TARTRATE 25 MG PO TABS: ORAL_TABLET | ORAL | 0 refills | Status: AC

## 2018-06-24 MED ORDER — HEPARIN SODIUM (PORCINE) 1000 UNIT/ML DIALYSIS
2000.0000 [IU] | INTRAMUSCULAR | Status: DC | PRN
  Filled 2018-06-24: qty 2

## 2018-06-24 MED ORDER — DOCUSATE SODIUM 100 MG PO CAPS: 100.0000 mg | ORAL_CAPSULE | Freq: Every day | ORAL | 0 refills | Status: DC

## 2018-06-24 NOTE — Progress Notes (Signed)
    [facility] will sign off.   Medication Recommendations:  No change in therapy.  Other recommendations (labs, testing, etc):  None Follow up as an outpatient:  We will arrange office follow.

## 2018-06-24 NOTE — Discharge Summary (Addendum)
Physician Discharge Summary  Patient ID: [name] MRN: [id number] DOB/AGE: Sep 21, 1946 72 y.o.  Admit date: 06/10/2018 Discharge date: 06/24/2018  Discharge Diagnoses:  Principal Problem:   Unilateral AKA, right ([name]) Active Problems:   COPD (chronic obstructive pulmonary disease) (HCC)   Type II diabetes mellitus with peripheral autonomic neuropathy (HCC)   Anemia of chronic disease   Postoperative pain   Ischemic leg pain   Steroid-induced hyperglycemia   ESRD on dialysis ([place])   SVT (supraventricular tachycardia) (HCC)   Atrial flutter ([place])   Discharged Condition:  Stable   Significant Diagnostic Studies: No results found.  Labs:  Basic Metabolic Panel: Recent Labs  Lab 06/17/18 1403 06/19/18 1417 06/22/18 1557 06/24/18 1013  NA 129* 132* 131* 130*  K 3.9 4.0 4.6 3.6  CL 91* 95* 94* 94*  CO2 23 25 26 24   GLUCOSE 161* 102* 79 121*  BUN 59* 64* 90* 62*  CREATININE 4.70* 4.59* 5.89* 4.34*  CALCIUM 8.0* 8.1* 8.6* 8.3*  PHOS 4.8* 4.2 4.5 4.4    CBC: Recent Labs  Lab 06/19/18 1416 06/22/18 1557 06/24/18 0821  WBC 9.1 9.1 6.0  NEUTROABS  --   --  3.9  HGB 8.5* 8.2* 8.0*  HCT 29.6* 28.5* 28.2*  MCV 93.7 92.8 93.1  PLT 235 226 258    CBG: Recent Labs  Lab 06/23/18 1214 06/23/18 1720 06/23/18 2213 06/24/18 0633 06/24/18 1226  GLUCAP 207* 86 118* 97 114*    Brief HPI:   [name] is a 72 year old female with history of T2DM with neuropathy, nephropathy and retinopathy, COPD, DVT, A. fib/a flutter, bilateral lower extremity wounds with progression of ulcers on right lower extremity to calf and anterior leg with inability to walk and significant pain.  Patient was finally agreeable to undergo surgical intervention and was admitted on 06/08/2018 for right AKA by Dr. Oneida Alar.  Postop therapy evaluations revealed functional deficits and CIR was recommended for follow-up therapy   Hospital Course: [name] was admitted to rehab  06/10/2018 for inpatient therapies to consist of PT  and OT at least three hours five days a week. Past admission physiatrist, therapy team and rehab RN have worked together to provide customized collaborative inpatient rehab.  Ego support has been provided by team and anxiety levels are controlled.  Hemodialysis has been ongoing on TTS at the end of the day to help with tolerance of therapy.  Anemia of chronic disease has been monitored and she was started on Aranesp weekly on 9/12 as well as Nulecit on 9/26.   Blood pressures have been monitored on twice daily basis and have been labile.  Beta-blocker was decreased to Monday, Wednesday, Friday to avoid hypotension with HD.  Nephro supplements as well as pro-stat was added for low calorie malnutrition and to promote wound healing.  Diabetes has been monitored with AC/at bedtime CBG checks.  Levemir was resumed at 5 mg daily and titrated upwards to 10 mg.  P.o. intake varies and she has had issues with hypoglycemic episode therefore Levemir was titrated down to 5 units again.  Husband was advised to continue checking blood sugars on AC/HS bedtime basis and use sliding scale insulin per home protocol.  She has had 2 episodes of  Aflutter and converted to sinus rhythm. As patient not always symptomatic' cardiology has recommended 30-day event monitor on outpatient basis.  Right AKA incision is healing well without signs or symptoms of infection and staples remain in place.  Left toes  and forefoot continues to have dark dry gangrenous changes.  She continues to have foot pain as well as intermittent issues with chronic abdominal pain that is managed with prn use of  Oxycodone. She has made steady progress and is min assist at wheelchair level. She will continue to receive follow up [place] [place] [facility] [place] [facility] after discharge.    Rehab course: During patient's stay in rehab weekly team conferences were held to monitor patient's progress, set goals  and discuss barriers to discharge. At admission, patient required mod assist for ADL tasks and mobility. She  has had improvement in activity tolerance, balance, postural control as well as ability to compensate for deficits  She is able to complete bathing with supervision and verbal cues.  She requires set up assist for upper body dressing and supervision with cues for lower body dressing. She is able to perform bed mobility with supervision and verbal cues.  She requires min assist for sliding board transfers.  She is able to propel her wheelchair with supervision and verbal cues.   Discharge disposition: 01-Home or Self Care  Diet: Renal diet. Diabetic restrictions.   Special Instructions: 1. Monitor BS ac/hs and continue to use novolog for BS > 100 and > 50% meal intake.  2. Dry dressing to left foot or can resume wound care to left foot as per wound clinic.   3. Follow up with GI (keep GI appointment next week) and GYN for work up of abdominal pain.  Allergies as of 06/24/2018      Reactions   Ace Inhibitors Cough      Medication List    TAKE these medications   acetaminophen 325 MG tablet Commonly known as:  TYLENOL Take 1-2 tablets (325-650 mg total) by mouth every 4 (four) hours as needed for mild pain. What changed:    medication strength  how much to take  when to take this  reasons to take this   albuterol 108 (90 Base) MCG/ACT inhaler Commonly known as:  PROVENTIL HFA;VENTOLIN HFA Inhale 2 puffs into the lungs every 6 (six) hours as needed for shortness of breath. What changed:  Another medication with the same name was removed. Continue taking this medication, and follow the directions you see here.   allopurinol 100 MG tablet Commonly known as:  ZYLOPRIM Take 100 mg by mouth every Monday, Wednesday, and Friday.   apixaban 5 MG Tabs tablet Commonly known as:  ELIQUIS Take 1 tablet (5 mg total) by mouth 2 (two) times daily.   atorvastatin 20 MG  tablet Commonly known as:  LIPITOR Take 20 mg by mouth at bedtime.   COMBIGAN 0.2-0.5 % ophthalmic solution Generic drug:  brimonidine-timolol Place 1 drop into the left eye 2 (two) times daily.   dicyclomine 10 MG capsule Commonly known as:  BENTYL Take 10 mg by mouth 4 (four) times daily -  before meals and at bedtime.   docusate sodium 100 MG capsule Commonly known as:  COLACE Take 1 capsule (100 mg total) by mouth daily.   feeding supplement (PRO-STAT SUGAR FREE 64) Liqd Take 30 mLs by mouth 2 (two) times daily.   fentaNYL 50 MCG/HR Commonly known as:  DURAGESIC - dosed mcg/hr Place 50 mcg onto the skin every 3 (three) days.   folic acid 1 MG tablet Commonly known as:  FOLVITE Take 1 mg by mouth daily.   folic acid-vitamin b complex-vitamin c-selenium-zinc 3 MG Tabs tablet Take 1 tablet by mouth  daily.   HUMALOG KWIKPEN 100 UNIT/ML KiwkPen Generic drug:  insulin lispro Inject 5 Units into the skin 3 (three) times daily with meals as needed (*Only take if blood sugar levels are over 100).   hydroxychloroquine 200 MG tablet Commonly known as:  PLAQUENIL Take 200 mg by mouth daily.   Insulin Detemir 100 UNIT/ML Pen Commonly known as:  LEVEMIR Inject 5 Units into the skin every morning. What changed:  how much to take   metoprolol tartrate 25 MG tablet Commonly known as:  LOPRESSOR Take 1/2 tablet twice a day on Mondays, Wednesdays and Fridays What changed:    medication strength  how much to take  how to take this  when to take this  additional instructions   oxyCODONE-acetaminophen 5-325 MG tablet--Rx # 28 pills Commonly known as:  PERCOCET/ROXICET Take 1 tablet by mouth every 6 (six) hours as needed for severe pain.   pantoprazole 40 MG tablet Commonly known as:  PROTONIX Take 1 tablet (40 mg total) by mouth daily.   predniSONE 5 MG tablet Commonly known as:  DELTASONE Take 5 mg by mouth daily with breakfast.   travoprost (benzalkonium) 0.004 %  ophthalmic solution Commonly known as:  TRAVATAN Place 1 drop into both eyes at bedtime.      Follow-up Information    Sharilyn Sites, MD Follow up on 06/30/2018.   Specialty:  Family Medicine Why:  Appointment @ 11:15 be there @ 10:45 am Contact information: [address] [place] [address] [phone]        Jamse Arn, MD Follow up.   Specialty:  Physical Medicine and Rehabilitation Why:  Office will call you with follow up appointment Contact information: [address] [place] [address] [phone]        Angelia Mould, MD Follow up on 07/14/2018.   Specialties:  Vascular Surgery, Cardiology Why:  Appointment at 3:30 for staple removal.  Contact information: [place] [address] [phone]        Erma Heritage, PA-C Follow up on 08/04/2018.   Specialties:  Physician Assistant, Cardiology Why:  Please arrive 15 minutes early for your 2:30pm cardiology appointment. The office will arrange a 30-day heart monitor to be performed prior to this visit.  Contact information: [place] [address] [phone]           Signed: Bary Leriche 06/25/2018, 12:15 PM

## 2018-06-24 NOTE — Discharge Instructions (Signed)
Inpatient Rehab Discharge Instructions  [name] Discharge date and time: 06/24/18   Activities/Precautions/ Functional Status: Activity: no lifting, driving, or strenuous exercise till cleared by MD Diet: renal diet 1200cc fluid restriction Wound Care: Cleanse right amputation site with soap and water. Pat dry and apply stocking. Keep wound clean and dry. Contact MD if you develop any problems with your incision/wound--redness, swelling, increase in pain, drainage or if you develop fever or chills.   Functional status:  ___ No restrictions     ___ Walk up steps independently _X__ 24/7 supervision/assistance   ___ Walk up steps with assistance ___ Intermittent supervision/assistance  ___ Bathe/dress independently ___ Walk with walker     ___ Bathe/dress with assistance ___ Walk Independently    ___ Shower independently ___ Walk with assistance    ___ Shower with assistance ___ No alcohol     ___ Return to work/school ________  Special Instructions: 1.Continue to monitor blood sugars before meals. Note decrease in Levemir. 2. Cleanse left foot with soap and water, pat dry and apply dry dressing. Do not soak or get foot wet.    My questions have been answered and I understand these instructions. I will adhere to these goals and the provided educational materials after my discharge from the hospital.  Patient/Caregiver Signature _______________________________ Date __________  Clinician Signature _______________________________________ Date __________  Please bring this form and your medication list with you to all your follow-up doctor's appointments. Information on my medicine - ELIQUIS (apixaban)  This medication education was reviewed with me or my healthcare representative as part of my discharge preparation.  The pharmacist that spoke with me during my hospital stay was:  Onnie Boer, RPH-CPP  Why was Eliquis prescribed for you? Eliquis was prescribed for you to  reduce the risk of a blood clot forming that can cause a stroke if you have a medical condition called atrial fibrillation (a type of irregular heartbeat).  What do You need to know about Eliquis ? Take your Eliquis TWICE DAILY - one tablet in the morning and one tablet in the evening with or without food. If you have difficulty swallowing the tablet whole please discuss with your pharmacist how to take the medication safely.  Take Eliquis exactly as prescribed by your doctor and DO NOT stop taking Eliquis without talking to the doctor who prescribed the medication.  Stopping may increase your risk of developing a stroke.  Refill your prescription before you run out.  After discharge, you should have regular check-up appointments with your healthcare provider that is prescribing your Eliquis.  In the future your dose may need to be changed if your kidney function or weight changes by a significant amount or as you get older.  What do you do if you miss a dose? If you miss a dose, take it as soon as you remember on the same day and resume taking twice daily.  Do not take more than one dose of ELIQUIS at the same time to make up a missed dose.  Important Safety Information A possible side effect of Eliquis is bleeding. You should call your healthcare provider right away if you experience any of the following: ? Bleeding from an injury or your nose that does not stop. ? Unusual colored urine (red or dark brown) or unusual colored stools (red or black). ? Unusual bruising for unknown reasons. ? A serious fall or if you hit your head (even if there is no bleeding).  Some medicines may  interact with Eliquis and might increase your risk of bleeding or clotting while on Eliquis. To help avoid this, consult your healthcare provider or pharmacist prior to using any new prescription or non-prescription medications, including herbals, vitamins, non-steroidal anti-inflammatory drugs (NSAIDs) and  supplements.  This website has more information on Eliquis (apixaban): http://www.eliquis.com/eliquis/home   COMMUNITY REFERRALS UPON DISCHARGE:    Home Health:   PT, OT, RN   [place]  Date of last service:06/24/2018  Medical Equipment/Items Ordered:30 TRANSFER BOARD AND PADDED TUB BENCH WITH CUT OUT  Agency/Supplier:[facility]   [phone]   GENERAL COMMUNITY RESOURCES FOR PATIENT/FAMILY: Support Groups:[facility] SUPPORT GROUP THE SECOND TUESDAY @ 7:00-8:30 PM @ [place] [phone]

## 2018-06-24 NOTE — Progress Notes (Signed)
Patient discharged to home per wheelchair accompanied by NT and PT student to help with transfer from chair to car. Discharge instructions done by [facility] PA. No further questions noted.

## 2018-06-24 NOTE — Procedures (Signed)
Patient was seen on dialysis and the procedure was supervised.  BFR 400  Via TDC BP is  128/44.   Patient appears to be tolerating treatment well. UF goal 2 L.  She will follow outpatient dialysis center after discharge.  [name] 06/24/2018

## 2018-06-24 NOTE — Plan of Care (Signed)
  Problem: RH SKIN INTEGRITY Goal: RH STG SKIN FREE OF INFECTION/BREAKDOWN Description Skin to remain free from infection and breakdown with mod assist while on rehab.  Outcome: Progressing Goal: RH STG ABLE TO PERFORM INCISION/WOUND CARE W/ASSISTANCE Description STG Able To Perform Incision and Wound Care With max Assistance.  Outcome: Progressing   Problem: RH PAIN MANAGEMENT Goal: RH STG PAIN MANAGED AT OR BELOW PT'S PAIN GOAL Description <4 on a 1-10 pain scale  Outcome: Progressing

## 2018-06-24 NOTE — Progress Notes (Addendum)
[facility] PHYSICAL MEDICINE & REHABILITATION     PROGRESS NOTE  Subjective/Complaints:  Patient seen laying in bed this morning.  She states she slept well overnight.  She appears aware, but slightly confused that she is going home today.  ROS: Denies CP, SOB, N/V/D.  Objective: Vital Signs: Blood pressure (!) 147/38, pulse 75, temperature 98.1 F (36.7 C), temperature source Oral, resp. rate 18, height 5\' 4"  (1.626 m), weight 64.2 kg, SpO2 100 %. No results found. Recent Labs    06/22/18 1557  WBC 9.1  HGB 8.2*  HCT 28.5*  PLT 226   Recent Labs    06/22/18 1557  NA 131*  K 4.6  CL 94*  GLUCOSE 79  BUN 90*  CREATININE 5.89*  CALCIUM 8.6*   CBG (last 3)  Recent Labs    06/23/18 1720 06/23/18 2213 06/24/18 0633  GLUCAP 86 118* 97    Wt Readings from Last 3 Encounters:  06/24/18 64.2 kg  06/10/18 65.4 kg  06/04/18 79.4 kg    Physical Exam:  BP (!) 147/38 (BP Location: Right Arm)   Pulse 75   Temp 98.1 F (36.7 C) (Oral)   Resp 18   Ht 5\' 4"  (1.626 m)   Wt 64.2 kg   SpO2 100%   BMI 24.29 kg/m  Constitutional: She has sickly appearance. NAD. HENT: normocephalic. Atraumatic Eyes: disconjugate gaze Cardiac: RRR.  No JVD. Lungs: Clear.  Unlabored. Abdomen: Positive bowel sounds, nondistended, non TTP with stethescope Musculoskeletal: Right AKA Neurologic: Alert and oriented Motor:  4/5 left hip flexors, knee extensors, ankle dorsiflexor, stable 4+/5 right hip flexion, stable Skin: Right AKA with shrinker C/D/I Left foot with dry gangrene  Assessment/Plan: 1. Functional deficits secondary to right AKA with left lower extremity dry gangrene which require 3+ hours per day of interdisciplinary therapy in a comprehensive inpatient rehab setting. Physiatrist is providing close team supervision and 24 hour management of active medical problems listed below. Physiatrist and rehab team continue to assess barriers to discharge/monitor patient progress toward  functional and medical goals.  Function:  Bathing Bathing position   Position: Other (comment)(sitting on BSC)  Bathing parts Body parts bathed by patient: Right arm, Left arm, Chest, Abdomen, Right upper leg, Left upper leg, Front perineal area, Buttocks Body parts bathed by helper: Back  Bathing assist Assist Level: Supervision or verbal cues   Set up : To obtain items  Upper Body Dressing/Undressing Upper body dressing   What is the patient wearing?: Pull over shirt/dress, Bra Bra - Perfomed by patient: Thread/unthread right bra strap, Thread/unthread left bra strap, Hook/unhook bra (pull down sports bra) Bra - Perfomed by helper: Hook/unhook bra (pull down sports bra) Pull over shirt/dress - Perfomed by patient: Thread/unthread right sleeve, Thread/unthread left sleeve, Put head through opening, Pull shirt over trunk          Upper body assist Assist Level: Set up   Set up : To obtain clothing/put away  Lower Body Dressing/Undressing Lower body dressing   What is the patient wearing?: Pants     Pants- Performed by patient: Thread/unthread right pants leg Pants- Performed by helper: Thread/unthread left pants leg, Pull pants up/down                      Lower body assist Assist for lower body dressing: Supervision or verbal cues      Toileting Toileting   Toileting steps completed by patient: Performs perineal hygiene Toileting steps completed by  helper: Adjust clothing prior to toileting, Adjust clothing after toileting Toileting Assistive Devices: Grab bar or rail(walker)  Toileting assist Assist level: Supervision or verbal cues   Transfers Chair/bed transfer   Chair/bed transfer method: Lateral scoot Chair/bed transfer assist level: Touching or steadying assistance (Pt > 75%) Chair/bed transfer assistive device: Armrests, Sliding board     Locomotion Ambulation Ambulation activity did not occur: Safety/medical concerns         Wheelchair    Type: Manual Max wheelchair distance: 150 Assist Level: Supervision or verbal cues  Cognition Comprehension Comprehension assist level: Understands basic 90% of the time/cues < 10% of the time  Expression Expression assist level: Expresses basic needs/ideas: With extra time/assistive device  Social Interaction Social Interaction assist level: Interacts appropriately with others with medication or extra time (anti-anxiety, antidepressant).  Problem Solving Problem solving assist level: Solves basic 90% of the time/requires cueing < 10% of the time  Memory Memory assist level: Recognizes or recalls 90% of the time/requires cueing < 10% of the time    Medical Problem List and Plan: 1. Decline in functional mobility and ADLs  secondary to Right Above knee amputation  DC today  Will see patient for transitional care management in 1-2 weeks post-discharge  Stump shrinker 2.H/oDVT/ A fib/anticoagulation:Pharmaceutical:Other (comment)--Eliquis. 3.ChronicPain Management:On fentanyl 50 mcg TD (100 mcg PTA?) with prn oxycodone.   Please see PA note regarding abdominal pain 4. Mood:LCSW to follow for evaluation and support. Seems to be accepting of thedecision forR-AKA.  5. Neuropsych: This patientis not fullycapable of making decisions onherown behalf. 6. Skin/Wound Care:Continue nephro/prostat supplements to promote healing. Monitor wounds for healing.   Patient will likely require left lower extremity amputation future 7. Fluids/Electrolytes/Nutrition:Daily weights with strict I/O. Labs with HD. 8. T2DM with neuropathy and retionpathy, coundounded by steroids:   Was on levemir 10 units daily--resumed 5 units daily, increased to 10 units on 9/16, increased to 13 units on 9/20 decreased to 10 on 9/24   CBG (last 3)  Recent Labs    06/23/18 1720 06/23/18 2213 06/24/18 0633  GLUCAP 86 118* 97   Improving on 9/26 9. PVD with left foot wound: Question due to calciphylaxis  v/s pyoderma gangrenosum.  10. A fib/Aflutter:Monitor heart rate twice daily. On Eliquis and beta-blocker twice daily, no extra metoprolol today due to HD,   Appreciate cardiology recommendations, no changes at present, follow-up as an outpatient.  Repeat ECG reviewed, sinus 11. SLE: Continue plaquenil and low dose prednisone.  12. ESRD: Schedule HD on TTS at end of day to help with tolerance of therapy.  13. Anemia of chronic disease:Arransep weekly started on 9/12.   Hemoglobin 8.2 on 9/24  Labs with HD  Continue to monitor 14. H/o Glaucoma: Stable on Timpotic and Xalatan.  15. H/o gastritis/erosive gastropathy: Per EGD 6/26. Continue PPI. Serial H/H monitored with HD. 16. Elevated blood pressure Vitals:   06/23/18 2026 06/24/18 0515  BP: (!) 161/42 (!) 147/38  Pulse: 79 75  Resp: 17 18  Temp: 98.6 F (37 C) 98.1 F (36.7 C)  SpO2: 95% 100%   Labile on 9/26, management per nephro 17. Leukocytosis: resolved  Afebrile  Cont to monitor  >30 minutes spent in total, with greater than 25 minutes in counseling and coordination of care with patient and husband regarding chronic conditions such as abdominal pain and vaginal bleeding and ischemic leg pain as well as cardiac related issues.  Please also see discharge summary.  LOS (Days) 14 A FACE TO  FACE EVALUATION WAS PERFORMED  Roopa Graver Lorie Phenix 06/24/2018 7:57 AM

## 2018-06-25 ENCOUNTER — Telehealth: Payer: Self-pay | Admitting: Registered Nurse

## 2018-06-25 DIAGNOSIS — I129 Hypertensive chronic kidney disease with stage 1 through stage 4 chronic kidney disease, or unspecified chronic kidney disease: Secondary | ICD-10-CM | POA: Diagnosis not present

## 2018-06-25 DIAGNOSIS — M1990 Unspecified osteoarthritis, unspecified site: Secondary | ICD-10-CM | POA: Diagnosis not present

## 2018-06-25 DIAGNOSIS — E1122 Type 2 diabetes mellitus with diabetic chronic kidney disease: Secondary | ICD-10-CM | POA: Diagnosis not present

## 2018-06-25 DIAGNOSIS — I872 Venous insufficiency (chronic) (peripheral): Secondary | ICD-10-CM | POA: Diagnosis not present

## 2018-06-25 DIAGNOSIS — M329 Systemic lupus erythematosus, unspecified: Secondary | ICD-10-CM | POA: Diagnosis not present

## 2018-06-25 DIAGNOSIS — N186 End stage renal disease: Secondary | ICD-10-CM | POA: Diagnosis not present

## 2018-06-25 DIAGNOSIS — E1142 Type 2 diabetes mellitus with diabetic polyneuropathy: Secondary | ICD-10-CM | POA: Diagnosis not present

## 2018-06-25 DIAGNOSIS — E11319 Type 2 diabetes mellitus with unspecified diabetic retinopathy without macular edema: Secondary | ICD-10-CM | POA: Diagnosis not present

## 2018-06-25 DIAGNOSIS — J449 Chronic obstructive pulmonary disease, unspecified: Secondary | ICD-10-CM | POA: Diagnosis not present

## 2018-06-25 DIAGNOSIS — I4891 Unspecified atrial fibrillation: Secondary | ICD-10-CM | POA: Diagnosis not present

## 2018-06-25 DIAGNOSIS — M150 Primary generalized (osteo)arthritis: Secondary | ICD-10-CM | POA: Diagnosis not present

## 2018-06-25 DIAGNOSIS — Z86718 Personal history of other venous thrombosis and embolism: Secondary | ICD-10-CM | POA: Diagnosis not present

## 2018-06-25 DIAGNOSIS — E1151 Type 2 diabetes mellitus with diabetic peripheral angiopathy without gangrene: Secondary | ICD-10-CM | POA: Diagnosis not present

## 2018-06-25 DIAGNOSIS — Z89611 Acquired absence of right leg above knee: Secondary | ICD-10-CM | POA: Diagnosis not present

## 2018-06-25 DIAGNOSIS — D631 Anemia in chronic kidney disease: Secondary | ICD-10-CM | POA: Diagnosis not present

## 2018-06-25 DIAGNOSIS — Z794 Long term (current) use of insulin: Secondary | ICD-10-CM | POA: Diagnosis not present

## 2018-06-25 DIAGNOSIS — H409 Unspecified glaucoma: Secondary | ICD-10-CM | POA: Diagnosis not present

## 2018-06-25 DIAGNOSIS — Z87891 Personal history of nicotine dependence: Secondary | ICD-10-CM | POA: Diagnosis not present

## 2018-06-25 DIAGNOSIS — E785 Hyperlipidemia, unspecified: Secondary | ICD-10-CM | POA: Diagnosis not present

## 2018-06-25 DIAGNOSIS — Z4781 Encounter for orthopedic aftercare following surgical amputation: Secondary | ICD-10-CM | POA: Diagnosis not present

## 2018-06-25 DIAGNOSIS — M109 Gout, unspecified: Secondary | ICD-10-CM | POA: Diagnosis not present

## 2018-06-25 DIAGNOSIS — Z992 Dependence on renal dialysis: Secondary | ICD-10-CM | POA: Diagnosis not present

## 2018-06-25 NOTE — Telephone Encounter (Signed)
Transitional Care call  Patient name: [name]  DOB: April 06, 1946 1. Are you/is patient experiencing any problems since coming home? No a. Are there any questions regarding any aspect of care? No 2. Are there any questions regarding medications administration/dosing? No a. Are meds being taken as prescribed? Yes b. "Patient should review meds with caller to confirm" [name] asked If I will call her hospital. 3. Have there been any falls? No 4. Has Home Health been to the house and/or have they contacted you? She's not sure,she asked if this provider would call her husband.  a. If not, have you tried to contact them? NA b. Can we help you contact them? NA 5. Are bowels and bladder emptying properly? Yes a. Are there any unexpected incontinence issues? NA/Anuric. ESRD b. If applicable, is patient following bowel/bladder programs? NA 6. Any fevers, problems with breathing, unexpected pain? No. 7. Are there any skin problems or new areas of breakdown? No 8. Has the patient/family member arranged specialty MD follow up (ie cardiology/neurology/renal/surgical/etc.)?  All follow up appointments were arranged pre-discharge.  a. Can we help arrange? NA 9. Does the patient need any other services or support that we can help arrange? NA 10. Are caregivers following through as expected in assisting the patient? Yes Has the patient quit smoking, drinking alcohol, or using drugs as recommended? [name] denies smoking, drinking alcohol or using illicit drugs.   [name] was called, he asked for this provider to call him on Monday, he was at an appointment.  Appointment date/time 07/07/18, arrival time 11:00 for 11:20 appointment with Dr. Posey Pronto. [facility]

## 2018-06-26 DIAGNOSIS — D631 Anemia in chronic kidney disease: Secondary | ICD-10-CM | POA: Diagnosis not present

## 2018-06-26 DIAGNOSIS — N2581 Secondary hyperparathyroidism of renal origin: Secondary | ICD-10-CM | POA: Diagnosis not present

## 2018-06-26 DIAGNOSIS — D509 Iron deficiency anemia, unspecified: Secondary | ICD-10-CM | POA: Diagnosis not present

## 2018-06-26 DIAGNOSIS — D689 Coagulation defect, unspecified: Secondary | ICD-10-CM | POA: Diagnosis not present

## 2018-06-26 DIAGNOSIS — E876 Hypokalemia: Secondary | ICD-10-CM | POA: Diagnosis not present

## 2018-06-26 DIAGNOSIS — N186 End stage renal disease: Secondary | ICD-10-CM | POA: Diagnosis not present

## 2018-06-28 ENCOUNTER — Telehealth: Payer: Self-pay | Admitting: Registered Nurse

## 2018-06-28 DIAGNOSIS — E1122 Type 2 diabetes mellitus with diabetic chronic kidney disease: Secondary | ICD-10-CM | POA: Diagnosis not present

## 2018-06-28 DIAGNOSIS — J449 Chronic obstructive pulmonary disease, unspecified: Secondary | ICD-10-CM | POA: Diagnosis not present

## 2018-06-28 DIAGNOSIS — Z794 Long term (current) use of insulin: Secondary | ICD-10-CM | POA: Diagnosis not present

## 2018-06-28 DIAGNOSIS — E11319 Type 2 diabetes mellitus with unspecified diabetic retinopathy without macular edema: Secondary | ICD-10-CM | POA: Diagnosis not present

## 2018-06-28 DIAGNOSIS — E1142 Type 2 diabetes mellitus with diabetic polyneuropathy: Secondary | ICD-10-CM | POA: Diagnosis not present

## 2018-06-28 DIAGNOSIS — Z89611 Acquired absence of right leg above knee: Secondary | ICD-10-CM | POA: Diagnosis not present

## 2018-06-28 DIAGNOSIS — H409 Unspecified glaucoma: Secondary | ICD-10-CM | POA: Diagnosis not present

## 2018-06-28 DIAGNOSIS — D631 Anemia in chronic kidney disease: Secondary | ICD-10-CM | POA: Diagnosis not present

## 2018-06-28 DIAGNOSIS — M329 Systemic lupus erythematosus, unspecified: Secondary | ICD-10-CM | POA: Diagnosis not present

## 2018-06-28 DIAGNOSIS — M1990 Unspecified osteoarthritis, unspecified site: Secondary | ICD-10-CM | POA: Diagnosis not present

## 2018-06-28 DIAGNOSIS — I872 Venous insufficiency (chronic) (peripheral): Secondary | ICD-10-CM | POA: Diagnosis not present

## 2018-06-28 DIAGNOSIS — Z86718 Personal history of other venous thrombosis and embolism: Secondary | ICD-10-CM | POA: Diagnosis not present

## 2018-06-28 DIAGNOSIS — M109 Gout, unspecified: Secondary | ICD-10-CM | POA: Diagnosis not present

## 2018-06-28 DIAGNOSIS — Z87891 Personal history of nicotine dependence: Secondary | ICD-10-CM | POA: Diagnosis not present

## 2018-06-28 DIAGNOSIS — Z992 Dependence on renal dialysis: Secondary | ICD-10-CM | POA: Diagnosis not present

## 2018-06-28 DIAGNOSIS — I4891 Unspecified atrial fibrillation: Secondary | ICD-10-CM | POA: Diagnosis not present

## 2018-06-28 DIAGNOSIS — E785 Hyperlipidemia, unspecified: Secondary | ICD-10-CM | POA: Diagnosis not present

## 2018-06-28 DIAGNOSIS — E1151 Type 2 diabetes mellitus with diabetic peripheral angiopathy without gangrene: Secondary | ICD-10-CM | POA: Diagnosis not present

## 2018-06-28 DIAGNOSIS — N186 End stage renal disease: Secondary | ICD-10-CM | POA: Diagnosis not present

## 2018-06-28 DIAGNOSIS — Z4781 Encounter for orthopedic aftercare following surgical amputation: Secondary | ICD-10-CM | POA: Diagnosis not present

## 2018-06-28 DIAGNOSIS — I129 Hypertensive chronic kidney disease with stage 1 through stage 4 chronic kidney disease, or unspecified chronic kidney disease: Secondary | ICD-10-CM | POA: Diagnosis not present

## 2018-06-28 DIAGNOSIS — M150 Primary generalized (osteo)arthritis: Secondary | ICD-10-CM | POA: Diagnosis not present

## 2018-06-28 NOTE — Telephone Encounter (Signed)
Placed a call to [name] and appointment date given. [name] appointment is on 07/07/18 arrival at 11:00 for 11:20 appointment with Dr. Posey Pronto. He verbalizes understanding.

## 2018-06-29 ENCOUNTER — Telehealth: Payer: Self-pay | Admitting: *Deleted

## 2018-06-29 DIAGNOSIS — E876 Hypokalemia: Secondary | ICD-10-CM | POA: Diagnosis not present

## 2018-06-29 DIAGNOSIS — N186 End stage renal disease: Secondary | ICD-10-CM | POA: Diagnosis not present

## 2018-06-29 DIAGNOSIS — D631 Anemia in chronic kidney disease: Secondary | ICD-10-CM | POA: Diagnosis not present

## 2018-06-29 DIAGNOSIS — D689 Coagulation defect, unspecified: Secondary | ICD-10-CM | POA: Diagnosis not present

## 2018-06-29 DIAGNOSIS — R52 Pain, unspecified: Secondary | ICD-10-CM | POA: Diagnosis not present

## 2018-06-29 DIAGNOSIS — D509 Iron deficiency anemia, unspecified: Secondary | ICD-10-CM | POA: Diagnosis not present

## 2018-06-29 DIAGNOSIS — N2581 Secondary hyperparathyroidism of renal origin: Secondary | ICD-10-CM | POA: Diagnosis not present

## 2018-06-29 DIAGNOSIS — Z23 Encounter for immunization: Secondary | ICD-10-CM | POA: Diagnosis not present

## 2018-06-29 DIAGNOSIS — E039 Hypothyroidism, unspecified: Secondary | ICD-10-CM | POA: Diagnosis not present

## 2018-06-29 DIAGNOSIS — E1129 Type 2 diabetes mellitus with other diabetic kidney complication: Secondary | ICD-10-CM | POA: Diagnosis not present

## 2018-06-29 NOTE — Telephone Encounter (Signed)
Mr Koffler called about the packet they received in the mail.  He says she is going to need help filling it out. (her appt is not until 07/07/18.  I spoke with Mr Hartl and said just to fill out what they can, arrive early and we can help if there is something specific needed.

## 2018-06-30 ENCOUNTER — Encounter: Payer: Self-pay | Admitting: Gastroenterology

## 2018-06-30 ENCOUNTER — Ambulatory Visit (INDEPENDENT_AMBULATORY_CARE_PROVIDER_SITE_OTHER): Payer: Medicare Other | Admitting: Gastroenterology

## 2018-06-30 VITALS — BP 102/60 | HR 90 | Temp 97.6°F | Ht 65.0 in | Wt 174.0 lb

## 2018-06-30 DIAGNOSIS — G4700 Insomnia, unspecified: Secondary | ICD-10-CM | POA: Diagnosis not present

## 2018-06-30 DIAGNOSIS — Z794 Long term (current) use of insulin: Secondary | ICD-10-CM | POA: Diagnosis not present

## 2018-06-30 DIAGNOSIS — E1151 Type 2 diabetes mellitus with diabetic peripheral angiopathy without gangrene: Secondary | ICD-10-CM | POA: Diagnosis not present

## 2018-06-30 DIAGNOSIS — E1142 Type 2 diabetes mellitus with diabetic polyneuropathy: Secondary | ICD-10-CM | POA: Diagnosis not present

## 2018-06-30 DIAGNOSIS — G8929 Other chronic pain: Secondary | ICD-10-CM

## 2018-06-30 DIAGNOSIS — M109 Gout, unspecified: Secondary | ICD-10-CM | POA: Diagnosis not present

## 2018-06-30 DIAGNOSIS — I4892 Unspecified atrial flutter: Secondary | ICD-10-CM | POA: Diagnosis not present

## 2018-06-30 DIAGNOSIS — R1031 Right lower quadrant pain: Secondary | ICD-10-CM | POA: Diagnosis not present

## 2018-06-30 DIAGNOSIS — M150 Primary generalized (osteo)arthritis: Secondary | ICD-10-CM | POA: Diagnosis not present

## 2018-06-30 DIAGNOSIS — M329 Systemic lupus erythematosus, unspecified: Secondary | ICD-10-CM | POA: Diagnosis not present

## 2018-06-30 DIAGNOSIS — Z87891 Personal history of nicotine dependence: Secondary | ICD-10-CM | POA: Diagnosis not present

## 2018-06-30 DIAGNOSIS — Z89611 Acquired absence of right leg above knee: Secondary | ICD-10-CM | POA: Diagnosis not present

## 2018-06-30 DIAGNOSIS — N186 End stage renal disease: Secondary | ICD-10-CM | POA: Diagnosis not present

## 2018-06-30 DIAGNOSIS — E785 Hyperlipidemia, unspecified: Secondary | ICD-10-CM | POA: Diagnosis not present

## 2018-06-30 DIAGNOSIS — Z86718 Personal history of other venous thrombosis and embolism: Secondary | ICD-10-CM | POA: Diagnosis not present

## 2018-06-30 DIAGNOSIS — D631 Anemia in chronic kidney disease: Secondary | ICD-10-CM | POA: Diagnosis not present

## 2018-06-30 DIAGNOSIS — E11319 Type 2 diabetes mellitus with unspecified diabetic retinopathy without macular edema: Secondary | ICD-10-CM | POA: Diagnosis not present

## 2018-06-30 DIAGNOSIS — Z4781 Encounter for orthopedic aftercare following surgical amputation: Secondary | ICD-10-CM | POA: Diagnosis not present

## 2018-06-30 DIAGNOSIS — E1122 Type 2 diabetes mellitus with diabetic chronic kidney disease: Secondary | ICD-10-CM | POA: Diagnosis not present

## 2018-06-30 DIAGNOSIS — I4891 Unspecified atrial fibrillation: Secondary | ICD-10-CM | POA: Diagnosis not present

## 2018-06-30 DIAGNOSIS — Z992 Dependence on renal dialysis: Secondary | ICD-10-CM | POA: Diagnosis not present

## 2018-06-30 DIAGNOSIS — M1990 Unspecified osteoarthritis, unspecified site: Secondary | ICD-10-CM | POA: Diagnosis not present

## 2018-06-30 DIAGNOSIS — I129 Hypertensive chronic kidney disease with stage 1 through stage 4 chronic kidney disease, or unspecified chronic kidney disease: Secondary | ICD-10-CM | POA: Diagnosis not present

## 2018-06-30 DIAGNOSIS — J449 Chronic obstructive pulmonary disease, unspecified: Secondary | ICD-10-CM | POA: Diagnosis not present

## 2018-06-30 DIAGNOSIS — H409 Unspecified glaucoma: Secondary | ICD-10-CM | POA: Diagnosis not present

## 2018-06-30 DIAGNOSIS — I872 Venous insufficiency (chronic) (peripheral): Secondary | ICD-10-CM | POA: Diagnosis not present

## 2018-06-30 NOTE — Patient Instructions (Signed)
We will be in touch soon with further recommendations once I speak to Dr. [name].

## 2018-06-30 NOTE — Progress Notes (Signed)
Primary Care Physician: Sharilyn Sites, MD  Primary Gastroenterologist:  Garfield Cornea, MD   Chief Complaint  Patient presents with  . Abdominal Pain    HPI: [name] is a 72 y.o. female here for follow-up of abdominal pain.  We saw her back in April for trend toward iron deficiency anemia, Hemoccult positive stool, right-sided abdominal pain.  She was noted to have a right upper kidney structure partially cystic but cannot exclude solid component follow-up with kidney doctor for further imaging.  Patient's last colonoscopy was in October 2017, 3 polyps removed, advised to have another colonoscopy in October 2020.  He had an EGD in June showing mild erosive reflux esophagitis but fairly extensive erosive gastropathy and enteropathy.    As far as her chronic right lower quadrant pain she has had multiple CTs without explanation.  Pain has been present for 1 year.  Pain is unrelated by meals or bowel function.  Fairly constant but at times worse than others.  Denies constipation or diarrhea.  She is fairly immobile.  Has yet to be fitted for prosthesis since her right above-the-knee amputation last month.  Appetite good.  No vomiting.  Urinates once per day.  No dysuria.  Has an appointment to see gynecologist 2 weeks because when she wipes after urinating she sees blood.   Current Outpatient Medications  Medication Sig Dispense Refill  . acetaminophen (TYLENOL) 325 MG tablet Take 1-2 tablets (325-650 mg total) by mouth every 4 (four) hours as needed for mild pain.    Marland Kitchen albuterol (PROVENTIL HFA;VENTOLIN HFA) 108 (90 BASE) MCG/ACT inhaler Inhale 2 puffs into the lungs every 6 (six) hours as needed for shortness of breath.     . allopurinol (ZYLOPRIM) 100 MG tablet Take 100 mg by mouth every Monday, Wednesday, and Friday.     . Amino Acids-Protein Hydrolys (FEEDING SUPPLEMENT, PRO-STAT SUGAR FREE 64,) LIQD Take 30 mLs by mouth 2 (two) times daily. 900 mL 0  . apixaban (ELIQUIS) 5  MG TABS tablet Take 1 tablet (5 mg total) by mouth 2 (two) times daily. 60 tablet 6  . atorvastatin (LIPITOR) 20 MG tablet Take 20 mg by mouth at bedtime.     . brimonidine-timolol (COMBIGAN) 0.2-0.5 % ophthalmic solution Place 1 drop into the left eye 2 (two) times daily.     Marland Kitchen dicyclomine (BENTYL) 10 MG capsule Take 10 mg by mouth 4 (four) times daily -  before meals and at bedtime.    . fentaNYL (DURAGESIC - DOSED MCG/HR) 50 MCG/HR Place 25 mcg onto the skin every 3 (three) days.     . folic acid (FOLVITE) 1 MG tablet Take 1 mg by mouth daily.    . hydroxychloroquine (PLAQUENIL) 200 MG tablet Take 200 mg by mouth daily.     . Insulin Detemir (LEVEMIR FLEXTOUCH) 100 UNIT/ML Pen Inject 5 Units into the skin every morning. 15 mL 11  . insulin lispro (HUMALOG KWIKPEN) 100 UNIT/ML KiwkPen Inject 5 Units into the skin 3 (three) times daily with meals as needed (*Only take if blood sugar levels are over 100).     . metoprolol tartrate (LOPRESSOR) 25 MG tablet Take 1/2 tablet twice a day on Mondays, Wednesdays and Fridays 30 tablet 0  . oxyCODONE-acetaminophen (PERCOCET/ROXICET) 5-325 MG tablet Take 1 tablet by mouth every 6 (six) hours as needed for severe pain. 28 tablet 0  . pantoprazole (PROTONIX) 40 MG tablet Take 1 tablet (40 mg total) by mouth daily.  30 tablet 11  . predniSONE (DELTASONE) 5 MG tablet Take 5 mg by mouth daily with breakfast.    . travoprost, benzalkonium, (TRAVATAN) 0.004 % ophthalmic solution Place 1 drop into both eyes at bedtime.      No current facility-administered medications for this visit.     Allergies as of 06/30/2018 - Review Complete 06/30/2018  Allergen Reaction Noted  . Ace inhibitors Cough 06/15/2015    ROS:  General: Negative for anorexia, weight loss, fever, chills, fatigue, weakness. ENT: Negative for hoarseness, difficulty swallowing , nasal congestion. CV: Negative for chest pain, angina, palpitations, dyspnea on exertion, peripheral edema.    Respiratory: Negative for dyspnea at rest, dyspnea on exertion, cough, sputum, wheezing.  GI: See history of present illness. GU:  Negative for dysuria, hematuria, urinary incontinence, urinary frequency, nocturnal urination.  Endo: Negative for unusual weight change.    Physical Examination:   BP 102/60   Pulse 90   Temp 97.6 F (36.4 C) (Oral)   Ht 5\' 5"  (1.651 m)   Wt 174 lb (78.9 kg) Comment: unable to stand; family member states last wt approx 174lb  BMI 28.96 kg/m   General: Well-nourished, well-developed in no acute distress. Accompanied by spouse.  Eyes: No icterus. Mouth: Oropharyngeal mucosa moist and pink , no lesions erythema or exudate. Lungs: Clear to auscultation bilaterally.  Heart: Regular rate and rhythm, no murmurs rubs or gallops.  Abdomen: examined in wheelchair. Patient uses transfer board at home and we were not able to get her on exam table. Bowel sounds are normal,  nondistended,   no abdominal bruits or hernia , no rebound or guarding.  Tender rlq near hip.  Extremities:right AKA Neuro: Alert and oriented x 4   Skin: Warm and dry, no jaundice.   Psych: Alert and cooperative, normal mood and affect.  Labs:  Lab Results  Component Value Date   CREATININE 4.34 (H) 06/24/2018   BUN 62 (H) 06/24/2018   NA 130 (L) 06/24/2018   K 3.6 06/24/2018   CL 94 (L) 06/24/2018   CO2 24 06/24/2018   Lab Results  Component Value Date   ALT 13 06/08/2018   AST 18 06/08/2018   ALKPHOS 83 06/08/2018   BILITOT 0.4 06/08/2018   Lab Results  Component Value Date   WBC 6.0 06/24/2018   HGB 8.0 (L) 06/24/2018   HCT 28.2 (L) 06/24/2018   MCV 93.1 06/24/2018   PLT 258 06/24/2018     Imaging Studies: No results found.

## 2018-07-01 DIAGNOSIS — E876 Hypokalemia: Secondary | ICD-10-CM | POA: Diagnosis not present

## 2018-07-01 DIAGNOSIS — D631 Anemia in chronic kidney disease: Secondary | ICD-10-CM | POA: Diagnosis not present

## 2018-07-01 DIAGNOSIS — E1129 Type 2 diabetes mellitus with other diabetic kidney complication: Secondary | ICD-10-CM | POA: Diagnosis not present

## 2018-07-01 DIAGNOSIS — N2581 Secondary hyperparathyroidism of renal origin: Secondary | ICD-10-CM | POA: Diagnosis not present

## 2018-07-01 DIAGNOSIS — D509 Iron deficiency anemia, unspecified: Secondary | ICD-10-CM | POA: Diagnosis not present

## 2018-07-01 DIAGNOSIS — D689 Coagulation defect, unspecified: Secondary | ICD-10-CM | POA: Diagnosis not present

## 2018-07-01 DIAGNOSIS — Z23 Encounter for immunization: Secondary | ICD-10-CM | POA: Diagnosis not present

## 2018-07-01 DIAGNOSIS — R52 Pain, unspecified: Secondary | ICD-10-CM | POA: Diagnosis not present

## 2018-07-01 DIAGNOSIS — N186 End stage renal disease: Secondary | ICD-10-CM | POA: Diagnosis not present

## 2018-07-01 DIAGNOSIS — E039 Hypothyroidism, unspecified: Secondary | ICD-10-CM | POA: Diagnosis not present

## 2018-07-01 NOTE — Progress Notes (Signed)
[facility] Note  07/02/2018     CHIEF COMPLAINT Patient presents for Retina Follow Up   HISTORY OF PRESENT ILLNESS: [name] is a 72 y.o. female who presents to the clinic today for:   HPI    Retina Follow Up    Patient presents with  Diabetic Retinopathy.  In right eye.  This started 5 months ago.  Severity is mild.  Since onset it is gradually improving.  I, the attending physician,  performed the HPI with the patient and updated documentation appropriately.          Comments    F/Un PDR OD S/P laser retinopexy OD (05/07/18) Patient states her vision "is better, I have floaters sometimes". Patient does not recall last BS, denies hyper/hypo glucemic episodes. Patient reports she had Right ABK(06/08/18)       Last edited by Bernarda Caffey, MD on 07/02/2018  2:54 PM. (History)      Referring physician: Sharilyn Sites, MD [address] [place], [place] [address]  HISTORICAL INFORMATION:   Selected notes from the MEDICAL RECORD NUMBER Referred by Dr. Kathlen Mody for concern of BDR LEE: 04.26.19 [employer]) [BCVA: OD: 20/30 OS: 20/50+2 Ocular Hx-Glaucoma OU, Pseudophakia OU, s/p YAP PC OS, floaters OU, diabetic retinopathy, HTN Ret, hx of iritis PMH- DM, HTN, lupus, arthritis, asthma  Previous exams by Silvestre Gunner and Blanchie Serve at [facility]: Current Outpatient Medications (Ophthalmic Drugs)  Medication Sig  . brimonidine-timolol (COMBIGAN) 0.2-0.5 % ophthalmic solution Place 1 drop into the left eye 2 (two) times daily.   . travoprost, benzalkonium, (TRAVATAN) 0.004 % ophthalmic solution Place 1 drop into both eyes at bedtime.    No current facility-administered medications for this visit.  (Ophthalmic Drugs)   Current Outpatient Medications (Other)  Medication Sig  . acetaminophen (TYLENOL) 325 MG tablet Take 1-2 tablets (325-650 mg total) by mouth every 4 (four) hours as needed for mild pain.  Marland Kitchen albuterol (PROVENTIL  HFA;VENTOLIN HFA) 108 (90 BASE) MCG/ACT inhaler Inhale 2 puffs into the lungs every 6 (six) hours as needed for shortness of breath.   . allopurinol (ZYLOPRIM) 100 MG tablet Take 100 mg by mouth every Monday, Wednesday, and Friday.   . Amino Acids-Protein Hydrolys (FEEDING SUPPLEMENT, PRO-STAT SUGAR FREE 64,) LIQD Take 30 mLs by mouth 2 (two) times daily.  Marland Kitchen apixaban (ELIQUIS) 5 MG TABS tablet Take 1 tablet (5 mg total) by mouth 2 (two) times daily.  Marland Kitchen atorvastatin (LIPITOR) 20 MG tablet Take 20 mg by mouth at bedtime.   . dicyclomine (BENTYL) 10 MG capsule Take 10 mg by mouth 4 (four) times daily -  before meals and at bedtime.  . fentaNYL (DURAGESIC - DOSED MCG/HR) 50 MCG/HR Place 25 mcg onto the skin every 3 (three) days.   . folic acid (FOLVITE) 1 MG tablet Take 1 mg by mouth daily.  . hydroxychloroquine (PLAQUENIL) 200 MG tablet Take 200 mg by mouth daily.   . Insulin Detemir (LEVEMIR FLEXTOUCH) 100 UNIT/ML Pen Inject 5 Units into the skin every morning.  . insulin lispro (HUMALOG KWIKPEN) 100 UNIT/ML KiwkPen Inject 5 Units into the skin 3 (three) times daily with meals as needed (*Only take if blood sugar levels are over 100).   . metoprolol tartrate (LOPRESSOR) 25 MG tablet Take 1/2 tablet twice a day on Mondays, Wednesdays and Fridays  . oxyCODONE-acetaminophen (PERCOCET/ROXICET) 5-325 MG tablet Take 1 tablet by mouth every 6 (six) hours as needed  for severe pain.  . pantoprazole (PROTONIX) 40 MG tablet Take 1 tablet (40 mg total) by mouth daily.  . predniSONE (DELTASONE) 5 MG tablet Take 5 mg by mouth daily with breakfast.   No current facility-administered medications for this visit.  (Other)      REVIEW OF SYSTEMS: ROS    Positive for: Endocrine, Eyes   Negative for: Constitutional, Gastrointestinal, Neurological, Skin, Genitourinary, Musculoskeletal, HENT, Cardiovascular, Respiratory, Psychiatric, Allergic/Imm, Heme/Lymph   Last edited by Zenovia Jordan, LPN on 57/0/1779  3:90  PM. (History)       ALLERGIES Allergies  Allergen Reactions  . Ace Inhibitors Cough    PAST MEDICAL HISTORY Past Medical History:  Diagnosis Date  . Anemia   . Arthritis   . Asthma   . Atrial fibrillation ([place])   . Atrial flutter [facility])    TEE/DCCV December 2013 - on Xarelto [facility])  . Bone spur of toe    Right 5th toe  . Chronic diastolic CHF (congestive heart failure) (HCC)    a. EF 50-55% by echo in 2015  . CKD (chronic kidney disease) stage 4, GFR 15-29 ml/min (HCC)    a. s/p fistula placement but not on HD. Followed by Nephrology.   . Colon polyposis   . COPD (chronic obstructive pulmonary disease) ([place])   . DVT of upper extremity (deep vein thrombosis) ([place])    Right basilic vein, June 3009  . Essential hypertension, benign   . Fractures   . Glaucoma   . SLE (systemic lupus erythematosus) ([place])   . Type 2 diabetes mellitus ([place])   . UTI (lower urinary tract infection)   . Wound of right leg 06/2017   Past Surgical History:  Procedure Laterality Date  . ABDOMINAL AORTOGRAM W/LOWER EXTREMITY N/A 12/18/2017   Procedure: ABDOMINAL AORTOGRAM W/LOWER EXTREMITY;  Surgeon: Elam Dutch, MD;  Location: [facility] CV [facility];  Service: Cardiovascular;  Laterality: N/A;  bilateral  . ABDOMINAL HYSTERECTOMY  1983  . AMPUTATION Right 06/08/2018   Procedure: RIGHT ABOVE KNEE AMPUTATION;  Surgeon: Elam Dutch, MD;  Location: [facility];  Service: Vascular;  Laterality: Right;  . ANKLE SURGERY  1993  . AV FISTULA PLACEMENT Left 03/27/2014   Procedure: ARTERIOVENOUS (AV) FISTULA CREATION;  Surgeon: Rosetta Posner, MD;  Location: [facility];  Service: Vascular;  Laterality: Left;  . BACK SURGERY  1980  . BASCILIC VEIN TRANSPOSITION Left 01/13/2018   Procedure: LEFT BASILIC VEIN TRANSPOSITION SECOND STAGE;  Surgeon: Elam Dutch, MD;  Location: [facility];  Service: Vascular;  Laterality: Left;  . CARDIOVASCULAR STRESS TEST  12/19/2009   no stress induced rhythm abnormalities, ekg  negative for ischemia  . CARDIOVERSION  09/16/2012   Procedure: CARDIOVERSION;  Surgeon: Sanda Klein, MD;  Location[facility];  Service: Cardiovascular;  Laterality: N/A;  . CATARACT EXTRACTION    . COLONOSCOPY  09/20/2012   Dr. [name]: multiple tubular adenomas   . COLONOSCOPY  2005   Dr. [name]. Polyps, path unknown   . COLONOSCOPY N/A 07/24/2016   Dr. [name]: 3 significant size polyps removed from the colon, tubular adenomas.  Next colonoscopy in October 2020.  Marland Kitchen ESOPHAGOGASTRODUODENOSCOPY  09/20/2012   Dr. [name]: duodenal erosion and possible resolving ulcer at the angularis   . ESOPHAGOGASTRODUODENOSCOPY N/A 03/24/2018   Dr. [name]: Mild erosive reflux esophagitis, erosive gastropathy and enteropathy fairly extensive, no H. pylori on biopsy.  Marland Kitchen EYE SURGERY    . IR FLUORO GUIDE CV LINE RIGHT  11/09/2017  .  IR US GUIDE VASC ACCESS RIGHT  11/09/2017  . REVISON OF ARTERIOVENOUS FISTULA Left 03/15/2018   Procedure: REVISION OF Left arm BASILIC VEIN TRANSPOSITION;  Surgeon: Angelia Mould, MD;  Location: [facility];  Service: Vascular;  Laterality: Left;  . TEE WITHOUT CARDIOVERSION  09/16/2012   Procedure: TRANSESOPHAGEAL ECHOCARDIOGRAM (TEE);  Surgeon: Sanda Klein, MD;  Location: [facility];  Service: Cardiovascular;  Laterality: N/A;  . TRANSESOPHAGEAL ECHOCARDIOGRAM WITH CARDIOVERSION  09/16/2012   EF 60-65%, moderate LVH, moderate regurg of the aortic valve, LA moderately dilated    FAMILY HISTORY Family History  Problem Relation Age of Onset  . Diabetes Brother   . Diabetes Father   . Hypertension Father   . Hypertension Sister   . Stroke Mother   . Diabetes Sister   . Hypertension Brother   . Colon cancer Neg Hx     SOCIAL HISTORY Social History   Tobacco Use  . Smoking status: Former Smoker    Packs/day: 1.00    Years: 30.00    Pack years: 30.00    Types: Cigarettes    Last attempt to quit: 08/29/2012    Years since quitting: 5.8  . Smokeless tobacco:  Never Used  . Tobacco comment: quit 08-2012  Substance Use Topics  . Alcohol use: No    Alcohol/week: 0.0 standard drinks  . Drug use: No         OPHTHALMIC EXAM:  Base Eye Exam    Visual Acuity (Snellen - Linear)      Right Left   Dist [place] 20/70 20/150   Dist ph [place] 20/60 20/150 +2  No glass today       Tonometry (Tonopen, 1:50 PM)      Right Left   Pressure 18 18       Pupils      Dark Light Shape React APD   Right 4 3 Round Slow None   Left 4 3 Round Brisk None       Visual Fields (Counting fingers)      Left Right    Full Full       Extraocular Movement      Right Left    Full, Ortho Full, Ortho       Neuro/Psych    Oriented x3:  Yes   Mood/Affect:  Normal       Dilation    Both eyes:  1.0% Mydriacyl, 2.5% Phenylephrine @ 1:50 PM        Slit Lamp and Fundus Exam    Slit Lamp Exam      Right Left   Lids/Lashes Dermatochalasis - upper lid, mild Meibomian gland dysfunction Dermatochalasis - upper lid, mild Meibomian gland dysfunction   Conjunctiva/Sclera White and quiet White and quiet   Cornea Arcus Arcus   Anterior Chamber Deep and quiet Deep and quiet   Iris Round and dilated, No NVI Round and dilated, No NVI   Lens PC IOL in good position three piece sulcus IOL with optic capture, open PC   Vitreous Vitreous syneresis empty - ?post vitrectomy       Fundus Exam      Right Left   Disc Pink and Sharp, no NVD Pink and Sharp, mild Pallor   C/D Ratio 0.2 0.25   Macula Blunted foveal reflex, Retinal pigment epithelial mottling, focal laser scars temporal to fovea, scattered Microaneurysms Good foveal reflex, Retinal pigment epithelial mottling, focal laser scars, rare Microaneurysms, mild Epiretinal membrane   Vessels Vascular attenuation, Tortuous  Vascular attenuation, Tortuous, AV crossing changes   Periphery Attached, scattered DBH; early laser changes -- good 360 PRP Attached, scattered DBH, 360 PRP          IMAGING AND PROCEDURES  Imaging  and Procedures for 02/01/18  OCT, Retina - OU - Both Eyes       Right Eye Quality was good. Central Foveal Thickness: 296. Progression has worsened. Findings include abnormal foveal contour, outer retinal atrophy, epiretinal membrane, vitreomacular adhesion , subretinal fluid, no IRF (new central sliver of SRF).   Left Eye Quality was good. Central Foveal Thickness: 209. Progression has been stable. Findings include abnormal foveal contour, epiretinal membrane, no SRF, outer retinal atrophy, no IRF (Interval improvement in cystic changes).   Notes *Images captured and stored on drive  Diagnosis / Impression:  OD: No DME, mild VMA with trace cystic changes, new central sliver of SRF--mild OS: ERM with abnormal contour, interval improvement in cystic changes  Clinical management:  See below  Abbreviations: NFP - Normal foveal profile. CME - cystoid macular edema. PED - pigment epithelial detachment. IRF - intraretinal fluid. SRF - subretinal fluid. EZ - ellipsoid zone. ERM - epiretinal membrane. ORA - outer retinal atrophy. ORT - outer retinal tubulation. SRHM - subretinal hyper-reflective material                  ASSESSMENT/PLAN:    ICD-10-CM   1. Proliferative diabetic retinopathy of right eye without macular edema associated with type 2 diabetes mellitus ([place]) E11.3591   2. Severe nonproliferative diabetic retinopathy of left eye without macular edema associated with type 2 diabetes mellitus ([place]) J85.6314   3. Retinal edema H35.81 OCT, Retina - OU - Both Eyes  4. Epiretinal membrane (ERM) of left eye H35.372   5. Pseudophakia of both eyes Z96.1     1-3.  PDR w/o macular edema OD  Severe NPDR w/o macular edema OS - former pt of Drs. Cordelia Pen and Kurup at [facility] - s/p focal laser and intravitreal injections - exam shows scattered [place] OU -- no NV - OCT without diabetic macular edema OD, mild noncentral edema OS - FA 8.13.19 shows leaking NV  OD, scattered peripheral capillary nonperfusion OS - s/p PRP OD (08.09.19) - s/p PRP OS (08.23.19) - good laser in place - f/u in 6 weeks, recheck FA and possible fill in PRP  4. Epiretinal membrane, left eye  The natural history, anatomy, potential for loss of vision, and treatment options including vitrectomy techniques and the complications of endophthalmitis, retinal detachment, vitreous hemorrhage, cataract progression and permanent vision loss discussed with the patient. - mild ERM - not visually significant - no surgical intervention indicated at this time - monitor  5. Pseudophakia OU  - s/p CE/IOL OU [facility])  - history of complex cataract surgery OS with retained lens fragment  - ?history of PPV/PPL OS w/ Kurup -- will [facility] to obtain [facility] from [facility]  - doing well -- PCIOLs in good position and stable  - monitor    Ophthalmic Meds Ordered this visit:  No orders of the defined types were placed in this encounter.      Return in about 6 weeks (around 08/13/2018) for FA.  There are no Patient Instructions on file for this visit.   Explained the diagnoses, plan, and follow up with the patient and they expressed understanding.  Patient expressed understanding of the importance of proper follow up care.   This document serves  as a record of services personally performed by Gardiner Sleeper, MD, PhD. It was created on their behalf by Ernest Mallick, OA, an ophthalmic assistant. The creation of this record is the provider's dictation and/or activities during the visit.    Electronically signed by: Ernest Mallick, OA  10.03.19 12:48 AM    Gardiner Sleeper, M.D., Ph.D. Diseases & Surgery of the Retina and Vitreous [facility]   I have reviewed the above documentation for accuracy and completeness, and I agree with the above. Gardiner Sleeper, M.D., Ph.D. 07/04/18 12:48 AM    Abbreviations: M myopia (nearsighted); A astigmatism; H  hyperopia (farsighted); P presbyopia; Mrx spectacle prescription;  CTL contact lenses; OD right eye; OS left eye; OU both eyes  XT exotropia; ET esotropia; PEK punctate epithelial keratitis; PEE punctate epithelial erosions; DES dry eye syndrome; MGD meibomian gland dysfunction; ATs artificial tears; PFAT's preservative free artificial tears; [place] nuclear sclerotic cataract; PSC posterior subcapsular cataract; ERM epi-retinal membrane; PVD posterior vitreous detachment; RD retinal detachment; DM diabetes mellitus; DR diabetic retinopathy; NPDR non-proliferative diabetic retinopathy; PDR proliferative diabetic retinopathy; CSME clinically significant macular edema; DME diabetic macular edema; dbh dot blot hemorrhages; CWS cotton wool spot; POAG primary open angle glaucoma; C/D cup-to-disc ratio; HVF humphrey visual field; GVF goldmann visual field; OCT optical coherence tomography; IOP intraocular pressure; BRVO Branch retinal vein occlusion; CRVO central retinal vein occlusion; CRAO central retinal artery occlusion; BRAO branch retinal artery occlusion; RT retinal tear; SB scleral buckle; PPV pars plana vitrectomy; VH Vitreous hemorrhage; PRP panretinal laser photocoagulation; IVK intravitreal kenalog; VMT vitreomacular traction; MH Macular hole;  NVD neovascularization of the disc; NVE neovascularization elsewhere; AREDS age related eye disease study; ARMD age related macular degeneration; POAG primary open angle glaucoma; EBMD epithelial/anterior basement membrane dystrophy; ACIOL anterior chamber intraocular lens; IOL intraocular lens; PCIOL posterior chamber intraocular lens; Phaco/IOL phacoemulsification with intraocular lens placement; [place] photorefractive keratectomy; LASIK laser assisted in situ keratomileusis; HTN hypertension; DM diabetes mellitus; COPD chronic obstructive pulmonary disease

## 2018-07-02 ENCOUNTER — Telehealth: Payer: Self-pay | Admitting: Gastroenterology

## 2018-07-02 ENCOUNTER — Ambulatory Visit (INDEPENDENT_AMBULATORY_CARE_PROVIDER_SITE_OTHER): Payer: Medicare Other | Admitting: Ophthalmology

## 2018-07-02 ENCOUNTER — Encounter: Payer: Self-pay | Admitting: Gastroenterology

## 2018-07-02 DIAGNOSIS — H3581 Retinal edema: Secondary | ICD-10-CM

## 2018-07-02 DIAGNOSIS — H35372 Puckering of macula, left eye: Secondary | ICD-10-CM

## 2018-07-02 DIAGNOSIS — E113591 Type 2 diabetes mellitus with proliferative diabetic retinopathy without macular edema, right eye: Secondary | ICD-10-CM

## 2018-07-02 DIAGNOSIS — E113492 Type 2 diabetes mellitus with severe nonproliferative diabetic retinopathy without macular edema, left eye: Secondary | ICD-10-CM | POA: Diagnosis not present

## 2018-07-02 DIAGNOSIS — Z961 Presence of intraocular lens: Secondary | ICD-10-CM

## 2018-07-02 NOTE — Assessment & Plan Note (Signed)
72 year old female with multiple comorbidities including end-stage renal disease on hemodialysis, chronic anemia who presents with chronic right lower quadrant pain of one-year duration.  Since we last saw her she underwent right above-the-knee amputation.  At this point she is wheelchair-bound, uses a transfer board.  Her right lower quadrant pain is persistent, sometimes worse than others.  Appears to be unrelated to meals or bowel function.  He has had multiple CTs, ultrasounds without any explanation.  EGD earlier this year could easily explain heme positive stool or upper abdominal discomfort but would not necessarily explain right lower quadrant pain.  She also has an indeterminate lesion in the right kidney, previously radiologist recommended noncontrast MRI to further evaluate.  This has not been done.  With regards to abdominal pain, will discuss further with Dr. [name].  Patient may benefit from second opinion from a tertiary care center.  I suspect were dealing with musculoskeletal component.  We will touch base with her nephrologist regarding abnormal kidney on imaging studies to have them follow-up on that.

## 2018-07-02 NOTE — Telephone Encounter (Signed)
Please let patient know she needs to follow up with her nephrologist regarding abnormal imaging of the right kidney. We advised her to do this earlier this year and unclear if it has been addressed.   Please contact nephrologist's nurse and discuss the abnormal results below and that radiology recommended noncontrast MRI. Please let them know we are just making sure they are aware since the u/s was done while patient was in hospital.   US Abdomen Complete from 11/03/17 IMPRESSION: Indeterminate right renal lesion measuring up to 2.8 cm. This could represent a complex cystic lesion but cannot exclude a solid component. This lesion is not well characterized on the prior non contrast CTs. Patient is not a candidate for contrast enhanced studies due to renal function but this lesion could be further characterized with a non contrast renal MRI.

## 2018-07-03 ENCOUNTER — Telehealth: Payer: Self-pay | Admitting: Student

## 2018-07-03 ENCOUNTER — Ambulatory Visit (INDEPENDENT_AMBULATORY_CARE_PROVIDER_SITE_OTHER): Payer: Medicare Other

## 2018-07-03 DIAGNOSIS — E1129 Type 2 diabetes mellitus with other diabetic kidney complication: Secondary | ICD-10-CM | POA: Diagnosis not present

## 2018-07-03 DIAGNOSIS — R52 Pain, unspecified: Secondary | ICD-10-CM | POA: Diagnosis not present

## 2018-07-03 DIAGNOSIS — N186 End stage renal disease: Secondary | ICD-10-CM | POA: Diagnosis not present

## 2018-07-03 DIAGNOSIS — I471 Supraventricular tachycardia: Secondary | ICD-10-CM

## 2018-07-03 DIAGNOSIS — E876 Hypokalemia: Secondary | ICD-10-CM | POA: Diagnosis not present

## 2018-07-03 DIAGNOSIS — N2581 Secondary hyperparathyroidism of renal origin: Secondary | ICD-10-CM | POA: Diagnosis not present

## 2018-07-03 DIAGNOSIS — Z23 Encounter for immunization: Secondary | ICD-10-CM | POA: Diagnosis not present

## 2018-07-03 DIAGNOSIS — D689 Coagulation defect, unspecified: Secondary | ICD-10-CM | POA: Diagnosis not present

## 2018-07-03 DIAGNOSIS — E039 Hypothyroidism, unspecified: Secondary | ICD-10-CM | POA: Diagnosis not present

## 2018-07-03 DIAGNOSIS — D631 Anemia in chronic kidney disease: Secondary | ICD-10-CM | POA: Diagnosis not present

## 2018-07-03 DIAGNOSIS — D509 Iron deficiency anemia, unspecified: Secondary | ICD-10-CM | POA: Diagnosis not present

## 2018-07-03 NOTE — Telephone Encounter (Addendum)
   [facility] called to report an abnormal EKG. They reported that she had episodes of SVT lasting for 30 to 45-second intervals and had a possible episode of NSVT lasting for 16 beats versus SVT with aberrancy. I asked them to please fax the strips to the office for review.  [facility] was unable to get in touch with the patient. I was able to reach her husband and she is currently undergoing hemodialysis. He reports that today is the first time she has worn the monitor as they had difficulty setting this up. Will continue with planned monitoring and review strips once available.   Signed, Erma Heritage, PA-C 07/03/2018, 2:44 PM Pager: [phone]

## 2018-07-04 ENCOUNTER — Encounter (INDEPENDENT_AMBULATORY_CARE_PROVIDER_SITE_OTHER): Payer: Self-pay | Admitting: Ophthalmology

## 2018-07-05 ENCOUNTER — Telehealth: Payer: Self-pay | Admitting: Cardiology

## 2018-07-05 DIAGNOSIS — Z794 Long term (current) use of insulin: Secondary | ICD-10-CM | POA: Diagnosis not present

## 2018-07-05 DIAGNOSIS — I872 Venous insufficiency (chronic) (peripheral): Secondary | ICD-10-CM | POA: Diagnosis not present

## 2018-07-05 DIAGNOSIS — J449 Chronic obstructive pulmonary disease, unspecified: Secondary | ICD-10-CM | POA: Diagnosis not present

## 2018-07-05 DIAGNOSIS — E11319 Type 2 diabetes mellitus with unspecified diabetic retinopathy without macular edema: Secondary | ICD-10-CM | POA: Diagnosis not present

## 2018-07-05 DIAGNOSIS — S81801S Unspecified open wound, right lower leg, sequela: Secondary | ICD-10-CM | POA: Diagnosis not present

## 2018-07-05 DIAGNOSIS — N186 End stage renal disease: Secondary | ICD-10-CM | POA: Diagnosis not present

## 2018-07-05 DIAGNOSIS — M329 Systemic lupus erythematosus, unspecified: Secondary | ICD-10-CM | POA: Diagnosis not present

## 2018-07-05 DIAGNOSIS — M1990 Unspecified osteoarthritis, unspecified site: Secondary | ICD-10-CM | POA: Diagnosis not present

## 2018-07-05 DIAGNOSIS — I4891 Unspecified atrial fibrillation: Secondary | ICD-10-CM | POA: Diagnosis not present

## 2018-07-05 DIAGNOSIS — H409 Unspecified glaucoma: Secondary | ICD-10-CM | POA: Diagnosis not present

## 2018-07-05 DIAGNOSIS — M109 Gout, unspecified: Secondary | ICD-10-CM | POA: Diagnosis not present

## 2018-07-05 DIAGNOSIS — Z136 Encounter for screening for cardiovascular disorders: Secondary | ICD-10-CM | POA: Diagnosis not present

## 2018-07-05 DIAGNOSIS — M150 Primary generalized (osteo)arthritis: Secondary | ICD-10-CM | POA: Diagnosis not present

## 2018-07-05 DIAGNOSIS — D631 Anemia in chronic kidney disease: Secondary | ICD-10-CM | POA: Diagnosis not present

## 2018-07-05 DIAGNOSIS — D6861 Antiphospholipid syndrome: Secondary | ICD-10-CM | POA: Diagnosis not present

## 2018-07-05 DIAGNOSIS — Z992 Dependence on renal dialysis: Secondary | ICD-10-CM | POA: Diagnosis not present

## 2018-07-05 DIAGNOSIS — Z89611 Acquired absence of right leg above knee: Secondary | ICD-10-CM | POA: Diagnosis not present

## 2018-07-05 DIAGNOSIS — Z4781 Encounter for orthopedic aftercare following surgical amputation: Secondary | ICD-10-CM | POA: Diagnosis not present

## 2018-07-05 DIAGNOSIS — E1142 Type 2 diabetes mellitus with diabetic polyneuropathy: Secondary | ICD-10-CM | POA: Diagnosis not present

## 2018-07-05 DIAGNOSIS — E1151 Type 2 diabetes mellitus with diabetic peripheral angiopathy without gangrene: Secondary | ICD-10-CM | POA: Diagnosis not present

## 2018-07-05 DIAGNOSIS — Z86718 Personal history of other venous thrombosis and embolism: Secondary | ICD-10-CM | POA: Diagnosis not present

## 2018-07-05 DIAGNOSIS — I129 Hypertensive chronic kidney disease with stage 1 through stage 4 chronic kidney disease, or unspecified chronic kidney disease: Secondary | ICD-10-CM | POA: Diagnosis not present

## 2018-07-05 DIAGNOSIS — E785 Hyperlipidemia, unspecified: Secondary | ICD-10-CM | POA: Diagnosis not present

## 2018-07-05 DIAGNOSIS — E1122 Type 2 diabetes mellitus with diabetic chronic kidney disease: Secondary | ICD-10-CM | POA: Diagnosis not present

## 2018-07-05 DIAGNOSIS — Z87891 Personal history of nicotine dependence: Secondary | ICD-10-CM | POA: Diagnosis not present

## 2018-07-05 MED ORDER — AMIODARONE HCL 200 MG PO TABS: ORAL_TABLET | ORAL | 6 refills | Status: DC

## 2018-07-05 NOTE — Telephone Encounter (Signed)
I spoke with husband as they were on their way to another doctor apt.I explained her monitor results and the reason for amiodarone.He verbalized understanding that [name] would take Amiodarone 200 mg twice a day for 2 weeks and then reduce to 200 mg daily.All other medications stay the same. Pt has f/u apt in November with B Strader PA-C

## 2018-07-05 NOTE — Addendum Note (Signed)
Addended by: Barbarann Ehlers A on: 07/05/2018 12:12 PM   Modules accepted: Orders

## 2018-07-05 NOTE — Progress Notes (Signed)
CC'D TO PCP °

## 2018-07-05 NOTE — Telephone Encounter (Signed)
Several monitor strips placed in my box for review from October 5 - I was not in the office to review these until today.  I reviewed the chart.  My last office encounter with the patient was in September 2018.  She has a history of atrial flutter previously requiring cardioversion, has been on Eliquis for stroke prophylaxis.  She is on low-dose Lopressor which is taken only on non-hemodialysis days.  She was recently hospitalized and seen by Dr. Percival Spanish in consultation with concern about recurring arrhythmia.  An outpatient cardiac monitor was then placed.  I reviewed the strips which show sinus rhythm and recurring sustained SVT and probable aberrantly conducted SVT with wide complex.  Very rapid rates up to 200 noted.  Cannot exclude atypical atrial flutter or other reentrant SVT.  I reviewed her recent lab work, LFTs are normal.  Last chest x-ray was without acute process.  Echocardiogram from February showed LVEF 55 to 60%.  Recommend starting amiodarone at 200 mg twice daily for the next 2 weeks as a low-dose load in an attempt to suppress recurring arrhythmia, then reduce to 200 mg once daily.  Continue Lopressor otherwise as well as Eliquis.  Please make sure that an office visit is scheduled for further review of the monitor.  Satira Sark, M.D., F.A.C.C.

## 2018-07-06 DIAGNOSIS — J449 Chronic obstructive pulmonary disease, unspecified: Secondary | ICD-10-CM | POA: Diagnosis not present

## 2018-07-06 DIAGNOSIS — E1129 Type 2 diabetes mellitus with other diabetic kidney complication: Secondary | ICD-10-CM | POA: Diagnosis not present

## 2018-07-06 DIAGNOSIS — M1990 Unspecified osteoarthritis, unspecified site: Secondary | ICD-10-CM | POA: Diagnosis not present

## 2018-07-06 DIAGNOSIS — Z23 Encounter for immunization: Secondary | ICD-10-CM | POA: Diagnosis not present

## 2018-07-06 DIAGNOSIS — D509 Iron deficiency anemia, unspecified: Secondary | ICD-10-CM | POA: Diagnosis not present

## 2018-07-06 DIAGNOSIS — E876 Hypokalemia: Secondary | ICD-10-CM | POA: Diagnosis not present

## 2018-07-06 DIAGNOSIS — I509 Heart failure, unspecified: Secondary | ICD-10-CM | POA: Diagnosis not present

## 2018-07-06 DIAGNOSIS — R52 Pain, unspecified: Secondary | ICD-10-CM | POA: Diagnosis not present

## 2018-07-06 DIAGNOSIS — N2581 Secondary hyperparathyroidism of renal origin: Secondary | ICD-10-CM | POA: Diagnosis not present

## 2018-07-06 DIAGNOSIS — N186 End stage renal disease: Secondary | ICD-10-CM | POA: Diagnosis not present

## 2018-07-06 DIAGNOSIS — D631 Anemia in chronic kidney disease: Secondary | ICD-10-CM | POA: Diagnosis not present

## 2018-07-06 DIAGNOSIS — E039 Hypothyroidism, unspecified: Secondary | ICD-10-CM | POA: Diagnosis not present

## 2018-07-06 DIAGNOSIS — D689 Coagulation defect, unspecified: Secondary | ICD-10-CM | POA: Diagnosis not present

## 2018-07-07 ENCOUNTER — Encounter: Payer: Self-pay | Admitting: Physical Medicine & Rehabilitation

## 2018-07-07 ENCOUNTER — Encounter: Payer: Medicare Other | Attending: Physical Medicine & Rehabilitation | Admitting: Physical Medicine & Rehabilitation

## 2018-07-07 VITALS — BP 117/72 | HR 89 | Resp 16 | Ht 65.0 in | Wt 137.0 lb

## 2018-07-07 DIAGNOSIS — I4891 Unspecified atrial fibrillation: Secondary | ICD-10-CM | POA: Diagnosis not present

## 2018-07-07 DIAGNOSIS — Z8249 Family history of ischemic heart disease and other diseases of the circulatory system: Secondary | ICD-10-CM | POA: Insufficient documentation

## 2018-07-07 DIAGNOSIS — Z87440 Personal history of urinary (tract) infections: Secondary | ICD-10-CM | POA: Insufficient documentation

## 2018-07-07 DIAGNOSIS — E1151 Type 2 diabetes mellitus with diabetic peripheral angiopathy without gangrene: Secondary | ICD-10-CM | POA: Diagnosis not present

## 2018-07-07 DIAGNOSIS — I872 Venous insufficiency (chronic) (peripheral): Secondary | ICD-10-CM | POA: Diagnosis not present

## 2018-07-07 DIAGNOSIS — I4892 Unspecified atrial flutter: Secondary | ICD-10-CM | POA: Insufficient documentation

## 2018-07-07 DIAGNOSIS — Z833 Family history of diabetes mellitus: Secondary | ICD-10-CM | POA: Insufficient documentation

## 2018-07-07 DIAGNOSIS — N186 End stage renal disease: Secondary | ICD-10-CM | POA: Diagnosis not present

## 2018-07-07 DIAGNOSIS — M109 Gout, unspecified: Secondary | ICD-10-CM | POA: Diagnosis not present

## 2018-07-07 DIAGNOSIS — E1140 Type 2 diabetes mellitus with diabetic neuropathy, unspecified: Secondary | ICD-10-CM | POA: Diagnosis not present

## 2018-07-07 DIAGNOSIS — H409 Unspecified glaucoma: Secondary | ICD-10-CM | POA: Diagnosis not present

## 2018-07-07 DIAGNOSIS — Z87891 Personal history of nicotine dependence: Secondary | ICD-10-CM | POA: Insufficient documentation

## 2018-07-07 DIAGNOSIS — J449 Chronic obstructive pulmonary disease, unspecified: Secondary | ICD-10-CM | POA: Diagnosis not present

## 2018-07-07 DIAGNOSIS — Z90710 Acquired absence of both cervix and uterus: Secondary | ICD-10-CM | POA: Insufficient documentation

## 2018-07-07 DIAGNOSIS — Z823 Family history of stroke: Secondary | ICD-10-CM | POA: Diagnosis not present

## 2018-07-07 DIAGNOSIS — E1142 Type 2 diabetes mellitus with diabetic polyneuropathy: Secondary | ICD-10-CM | POA: Diagnosis not present

## 2018-07-07 DIAGNOSIS — D631 Anemia in chronic kidney disease: Secondary | ICD-10-CM | POA: Diagnosis not present

## 2018-07-07 DIAGNOSIS — Z794 Long term (current) use of insulin: Secondary | ICD-10-CM

## 2018-07-07 DIAGNOSIS — I998 Other disorder of circulatory system: Secondary | ICD-10-CM | POA: Diagnosis not present

## 2018-07-07 DIAGNOSIS — I5032 Chronic diastolic (congestive) heart failure: Secondary | ICD-10-CM | POA: Diagnosis not present

## 2018-07-07 DIAGNOSIS — G894 Chronic pain syndrome: Secondary | ICD-10-CM

## 2018-07-07 DIAGNOSIS — G8929 Other chronic pain: Secondary | ICD-10-CM | POA: Insufficient documentation

## 2018-07-07 DIAGNOSIS — M1990 Unspecified osteoarthritis, unspecified site: Secondary | ICD-10-CM | POA: Diagnosis not present

## 2018-07-07 DIAGNOSIS — E1122 Type 2 diabetes mellitus with diabetic chronic kidney disease: Secondary | ICD-10-CM | POA: Diagnosis not present

## 2018-07-07 DIAGNOSIS — E11319 Type 2 diabetes mellitus with unspecified diabetic retinopathy without macular edema: Secondary | ICD-10-CM | POA: Insufficient documentation

## 2018-07-07 DIAGNOSIS — R269 Unspecified abnormalities of gait and mobility: Secondary | ICD-10-CM

## 2018-07-07 DIAGNOSIS — R109 Unspecified abdominal pain: Secondary | ICD-10-CM | POA: Diagnosis not present

## 2018-07-07 DIAGNOSIS — E785 Hyperlipidemia, unspecified: Secondary | ICD-10-CM | POA: Diagnosis not present

## 2018-07-07 DIAGNOSIS — Z992 Dependence on renal dialysis: Secondary | ICD-10-CM | POA: Diagnosis not present

## 2018-07-07 DIAGNOSIS — Z89611 Acquired absence of right leg above knee: Secondary | ICD-10-CM | POA: Insufficient documentation

## 2018-07-07 DIAGNOSIS — Z86718 Personal history of other venous thrombosis and embolism: Secondary | ICD-10-CM | POA: Insufficient documentation

## 2018-07-07 DIAGNOSIS — I132 Hypertensive heart and chronic kidney disease with heart failure and with stage 5 chronic kidney disease, or end stage renal disease: Secondary | ICD-10-CM | POA: Insufficient documentation

## 2018-07-07 DIAGNOSIS — E1121 Type 2 diabetes mellitus with diabetic nephropathy: Secondary | ICD-10-CM | POA: Diagnosis not present

## 2018-07-07 DIAGNOSIS — S78111A Complete traumatic amputation at level between right hip and knee, initial encounter: Secondary | ICD-10-CM | POA: Diagnosis not present

## 2018-07-07 DIAGNOSIS — M79606 Pain in leg, unspecified: Secondary | ICD-10-CM

## 2018-07-07 DIAGNOSIS — S81801S Unspecified open wound, right lower leg, sequela: Secondary | ICD-10-CM

## 2018-07-07 DIAGNOSIS — M329 Systemic lupus erythematosus, unspecified: Secondary | ICD-10-CM | POA: Insufficient documentation

## 2018-07-07 DIAGNOSIS — I129 Hypertensive chronic kidney disease with stage 1 through stage 4 chronic kidney disease, or unspecified chronic kidney disease: Secondary | ICD-10-CM | POA: Diagnosis not present

## 2018-07-07 DIAGNOSIS — Z4781 Encounter for orthopedic aftercare following surgical amputation: Secondary | ICD-10-CM | POA: Diagnosis not present

## 2018-07-07 DIAGNOSIS — M150 Primary generalized (osteo)arthritis: Secondary | ICD-10-CM | POA: Diagnosis not present

## 2018-07-07 NOTE — Progress Notes (Signed)
Subjective:    Patient ID: [name], female    DOB: [birth year], 72 y.o.   MRN: [id number]  HPI 72 year old female with history of T2DM with neuropathy, nephropathy and retinopathy, COPD, DVT, A. fib/a flutter, bilateral lower extremity wounds with progression of ulcers on right lower extremity to calf and anterior leg presents for transitional care management after receiving CIR for right AKA.  Admit date: 06/10/2018 Discharge date: 06/24/2018  Husband present, who provides history. At discharge, she was instructed to check CBGs, husband stated upper 100s-200s. She is follow up with PCP. She has an appointment with VS next week. She sees Cards next month. Left foot is the same. She saw GI and is doing additional workup.  She sees Gyn next week. BP is controlled. Denies falls.  DME: Bedside commode, transfer board Therapies: 1/weeks Mobility: Wheelchair at all times.   Pain Inventory Average Pain 8 Pain Right Now 7 My pain is stabbing  In the last 24 hours, has pain interfered with the following? General activity 5 Relation with others 5 Enjoyment of life 5 What TIME of day is your pain at its worst? evening Sleep (in general) Fair  Pain is worse with: some activites Pain improves with: medication Relief from Meds: 9  Mobility ability to climb steps?  no do you drive?  no  Function retired I need assistance with the following:  feeding, dressing, bathing, toileting, meal prep, household duties and shopping  Neuro/Psych bladder control problems bowel control problems weakness numbness tingling trouble walking confusion depression  Prior Studies Any changes since last visit?  no  Physicians involved in your care Any changes since last visit?  no   Family History  Problem Relation Age of Onset  . Diabetes Brother   . Diabetes Father   . Hypertension Father   . Hypertension Sister   . Stroke Mother   . Diabetes Sister   . Hypertension Brother     . Colon cancer Neg Hx    Social History   Socioeconomic History  . Marital status: Married    Spouse name: Not on file  . Number of children: Not on file  . Years of education: Not on file  . Highest education level: Not on file  Occupational History  . Not on file  Social Needs  . Financial resource strain: Not on file  . Food insecurity:    Worry: Not on file    Inability: Not on file  . Transportation needs:    Medical: Not on file    Non-medical: Not on file  Tobacco Use  . Smoking status: Former Smoker    Packs/day: 1.00    Years: 30.00    Pack years: 30.00    Types: Cigarettes    Last attempt to quit: 08/29/2012    Years since quitting: 5.8  . Smokeless tobacco: Never Used  . Tobacco comment: quit 08-2012  Substance and Sexual Activity  . Alcohol use: No    Alcohol/week: 0.0 standard drinks  . Drug use: No  . Sexual activity: Yes    Partners: Male  Lifestyle  . Physical activity:    Days per week: Not on file    Minutes per session: Not on file  . Stress: Not on file  Relationships  . Social connections:    Talks on phone: Not on file    Gets together: Not on file    Attends religious service: Not on file    Active member of club  or organization: Not on file    Attends meetings of clubs or organizations: Not on file    Relationship status: Not on file  Other Topics Concern  . Not on file  Social History Narrative  . Not on file   Past Surgical History:  Procedure Laterality Date  . ABDOMINAL AORTOGRAM W/LOWER EXTREMITY N/A 12/18/2017   Procedure: ABDOMINAL AORTOGRAM W/LOWER EXTREMITY;  Surgeon: Elam Dutch, MD;  Location: [place] [facility];  Service: Cardiovascular;  Laterality: N/A;  bilateral  . ABDOMINAL HYSTERECTOMY  1983  . AMPUTATION Right 06/08/2018   Procedure: RIGHT ABOVE KNEE AMPUTATION;  Surgeon: Elam Dutch, MD;  Location: [place];  Service: Vascular;  Laterality: Right;  . ANKLE SURGERY  1993  . AV FISTULA PLACEMENT Left  03/27/2014   Procedure: ARTERIOVENOUS (AV) FISTULA CREATION;  Surgeon: Rosetta Posner, MD;  Location: [place];  Service: Vascular;  Laterality: Left;  . BACK SURGERY  1980  . BASCILIC VEIN TRANSPOSITION Left 01/13/2018   Procedure: LEFT BASILIC VEIN TRANSPOSITION SECOND STAGE;  Surgeon: Elam Dutch, MD;  Location: [place];  Service: Vascular;  Laterality: Left;  . CARDIOVASCULAR STRESS TEST  12/19/2009   no stress induced rhythm abnormalities, ekg negative for ischemia  . CARDIOVERSION  09/16/2012   Procedure: CARDIOVERSION;  Surgeon: Sanda Klein, MD;  [facility];  Service: Cardiovascular;  Laterality: N/A;  . CATARACT EXTRACTION    . COLONOSCOPY  09/20/2012   Dr. [name]: multiple tubular adenomas   . COLONOSCOPY  2005   Dr. [name]. Polyps, path unknown   . COLONOSCOPY N/A 07/24/2016   Dr. [name]: 3 significant size polyps removed from the colon, tubular adenomas.  Next colonoscopy in October 2020.  Marland Kitchen ESOPHAGOGASTRODUODENOSCOPY  09/20/2012   Dr. [name]: duodenal erosion and possible resolving ulcer at the angularis   . ESOPHAGOGASTRODUODENOSCOPY N/A 03/24/2018   Dr. [name]: Mild erosive reflux esophagitis, erosive gastropathy and enteropathy fairly extensive, no H. pylori on biopsy.  Marland Kitchen EYE SURGERY    . IR FLUORO GUIDE CV LINE RIGHT  11/09/2017  . IR US GUIDE VASC ACCESS RIGHT  11/09/2017  . REVISON OF ARTERIOVENOUS FISTULA Left 03/15/2018   Procedure: REVISION OF Left arm BASILIC VEIN TRANSPOSITION;  Surgeon: Angelia Mould, MD;  Location: [place];  Service: Vascular;  Laterality: Left;  . TEE WITHOUT CARDIOVERSION  09/16/2012   Procedure: TRANSESOPHAGEAL ECHOCARDIOGRAM (TEE);  Surgeon: Sanda Klein, MD;  Location[facility];  Service: Cardiovascular;  Laterality: N/A;  . TRANSESOPHAGEAL ECHOCARDIOGRAM WITH CARDIOVERSION  09/16/2012   EF 60-65%, moderate LVH, moderate regurg of the aortic valve, LA moderately dilated   Past Medical History:  Diagnosis Date  . Anemia    . Arthritis   . Asthma   . Atrial fibrillation ([place])   . Atrial flutter [facility])    TEE/DCCV December 2013 - on Xarelto [facility])  . Bone spur of toe    Right 5th toe  . Chronic diastolic CHF (congestive heart failure) (HCC)    a. EF 50-55% by echo in 2015  . CKD (chronic kidney disease) stage 4, GFR 15-29 ml/min (HCC)    a. s/p fistula placement but not on HD. Followed by Nephrology.   . Colon polyposis   . COPD (chronic obstructive pulmonary disease) ([place])   . DVT of upper extremity (deep vein thrombosis) ([place])    Right basilic vein, June 1610  . Essential hypertension, benign   . Fractures   . Glaucoma   . SLE (systemic lupus  erythematosus) ([place])   . Type 2 diabetes mellitus ([place])   . UTI (lower urinary tract infection)   . Wound of right leg 06/2017   BP 117/72   Pulse 89   Resp 16   Ht 5\' 5"  (1.651 m)   Wt 137 lb (62.1 kg)   BMI 22.80 kg/m   Opioid Risk Score:   Fall Risk Score:  `1  Depression screen PHQ 2/9  Depression screen [facility] 2/9 05/12/2018 01/27/2018 10/27/2017 07/24/2017 01/16/2017  Decreased Interest 0 0 0 0 0  Down, Depressed, Hopeless 0 0 0 0 0  PHQ - 2 Score 0 0 0 0 0  Some recent data might be hidden     Review of Systems  Constitutional: Positive for unexpected weight change.  HENT: Negative.   Eyes: Negative.   Respiratory: Positive for apnea, cough, shortness of breath and wheezing.   Cardiovascular: Negative.   Gastrointestinal: Positive for abdominal pain, constipation, diarrhea and nausea.  Endocrine: Negative.   Genitourinary: Negative.   Musculoskeletal: Positive for arthralgias, gait problem and myalgias.  Skin: Positive for rash.  Allergic/Immunologic: Negative.   Neurological: Positive for weakness and numbness.  Hematological: Negative.   Psychiatric/Behavioral: Positive for confusion and dysphoric mood.  All other systems reviewed and are negative.      Objective:   Physical Exam Constitutional: She has sickly appearance.  NAD. HENT: normocephalic. Atraumatic Eyes: disconjugate gaze Cardiac: RRR. No JVD. Lungs: Clear. Unlabored. Abdomen: Positive bowel sounds, nondistended Musculoskeletal: Right AKA Neurologic: Alert and oriented Motor:  4/5 left hip flexors, 4+/5 knee extensors, ankle dorsiflexor 4+/5 right hip flexion Skin: Right AKA with shrinker C/D/I Left foot with dressing c/d/i    Assessment & Plan:  72 year old female with history of T2DM with neuropathy, nephropathy and retinopathy, COPD, DVT, A. fib/a flutter, bilateral lower extremity wounds with progression of ulcers on right lower extremity to calf and anterior leg presents for transitional care management after receiving CIR for right AKA.  1. Decline in functional mobility and ADLs  secondary to Right Above knee amputation  Cont therapies  Follow up with Vascular  2.  Chronic Pain Management  Fentanyl per PCP  3. Ischemic left foot  Stable per pt, currently dressed  4. T2DM with neuropathy and retionpathy, coundounded by steroids:   Appears to be elevated, recommended follow up with PCP  5. A fib/Aflutter:   Cont to follow up with Cards  Cont monitor  6. ESRD:   Cont follow up with Nephrology  7. Abdominal pain  Cont follow up with GI  Meds reviewed Referrals reviewed All questions answered

## 2018-07-08 DIAGNOSIS — N2581 Secondary hyperparathyroidism of renal origin: Secondary | ICD-10-CM | POA: Diagnosis not present

## 2018-07-08 DIAGNOSIS — D509 Iron deficiency anemia, unspecified: Secondary | ICD-10-CM | POA: Diagnosis not present

## 2018-07-08 DIAGNOSIS — N186 End stage renal disease: Secondary | ICD-10-CM | POA: Diagnosis not present

## 2018-07-08 DIAGNOSIS — D689 Coagulation defect, unspecified: Secondary | ICD-10-CM | POA: Diagnosis not present

## 2018-07-08 DIAGNOSIS — E876 Hypokalemia: Secondary | ICD-10-CM | POA: Diagnosis not present

## 2018-07-08 DIAGNOSIS — R52 Pain, unspecified: Secondary | ICD-10-CM | POA: Diagnosis not present

## 2018-07-08 DIAGNOSIS — E1129 Type 2 diabetes mellitus with other diabetic kidney complication: Secondary | ICD-10-CM | POA: Diagnosis not present

## 2018-07-08 DIAGNOSIS — E039 Hypothyroidism, unspecified: Secondary | ICD-10-CM | POA: Diagnosis not present

## 2018-07-08 DIAGNOSIS — Z23 Encounter for immunization: Secondary | ICD-10-CM | POA: Diagnosis not present

## 2018-07-08 DIAGNOSIS — D631 Anemia in chronic kidney disease: Secondary | ICD-10-CM | POA: Diagnosis not present

## 2018-07-09 DIAGNOSIS — E11319 Type 2 diabetes mellitus with unspecified diabetic retinopathy without macular edema: Secondary | ICD-10-CM | POA: Diagnosis not present

## 2018-07-09 DIAGNOSIS — M150 Primary generalized (osteo)arthritis: Secondary | ICD-10-CM | POA: Diagnosis not present

## 2018-07-09 DIAGNOSIS — I129 Hypertensive chronic kidney disease with stage 1 through stage 4 chronic kidney disease, or unspecified chronic kidney disease: Secondary | ICD-10-CM | POA: Diagnosis not present

## 2018-07-09 DIAGNOSIS — Z794 Long term (current) use of insulin: Secondary | ICD-10-CM | POA: Diagnosis not present

## 2018-07-09 DIAGNOSIS — E785 Hyperlipidemia, unspecified: Secondary | ICD-10-CM | POA: Diagnosis not present

## 2018-07-09 DIAGNOSIS — I872 Venous insufficiency (chronic) (peripheral): Secondary | ICD-10-CM | POA: Diagnosis not present

## 2018-07-09 DIAGNOSIS — H409 Unspecified glaucoma: Secondary | ICD-10-CM | POA: Diagnosis not present

## 2018-07-09 DIAGNOSIS — E1151 Type 2 diabetes mellitus with diabetic peripheral angiopathy without gangrene: Secondary | ICD-10-CM | POA: Diagnosis not present

## 2018-07-09 DIAGNOSIS — Z4781 Encounter for orthopedic aftercare following surgical amputation: Secondary | ICD-10-CM | POA: Diagnosis not present

## 2018-07-09 DIAGNOSIS — I4891 Unspecified atrial fibrillation: Secondary | ICD-10-CM | POA: Diagnosis not present

## 2018-07-09 DIAGNOSIS — E1142 Type 2 diabetes mellitus with diabetic polyneuropathy: Secondary | ICD-10-CM | POA: Diagnosis not present

## 2018-07-09 DIAGNOSIS — M1990 Unspecified osteoarthritis, unspecified site: Secondary | ICD-10-CM | POA: Diagnosis not present

## 2018-07-09 DIAGNOSIS — Z89611 Acquired absence of right leg above knee: Secondary | ICD-10-CM | POA: Diagnosis not present

## 2018-07-09 DIAGNOSIS — D631 Anemia in chronic kidney disease: Secondary | ICD-10-CM | POA: Diagnosis not present

## 2018-07-09 DIAGNOSIS — Z87891 Personal history of nicotine dependence: Secondary | ICD-10-CM | POA: Diagnosis not present

## 2018-07-09 DIAGNOSIS — N186 End stage renal disease: Secondary | ICD-10-CM | POA: Diagnosis not present

## 2018-07-09 DIAGNOSIS — E1122 Type 2 diabetes mellitus with diabetic chronic kidney disease: Secondary | ICD-10-CM | POA: Diagnosis not present

## 2018-07-09 DIAGNOSIS — M329 Systemic lupus erythematosus, unspecified: Secondary | ICD-10-CM | POA: Diagnosis not present

## 2018-07-09 DIAGNOSIS — Z136 Encounter for screening for cardiovascular disorders: Secondary | ICD-10-CM | POA: Diagnosis not present

## 2018-07-09 DIAGNOSIS — Z992 Dependence on renal dialysis: Secondary | ICD-10-CM | POA: Diagnosis not present

## 2018-07-09 DIAGNOSIS — D6861 Antiphospholipid syndrome: Secondary | ICD-10-CM | POA: Diagnosis not present

## 2018-07-09 DIAGNOSIS — Z86718 Personal history of other venous thrombosis and embolism: Secondary | ICD-10-CM | POA: Diagnosis not present

## 2018-07-09 DIAGNOSIS — J449 Chronic obstructive pulmonary disease, unspecified: Secondary | ICD-10-CM | POA: Diagnosis not present

## 2018-07-09 DIAGNOSIS — M109 Gout, unspecified: Secondary | ICD-10-CM | POA: Diagnosis not present

## 2018-07-09 NOTE — Telephone Encounter (Signed)
Lm for Dr. Tempie Hoist nurse Blanch Media at [phone]. I was given this info by the dialysis center. Waiting on a return call.

## 2018-07-09 NOTE — Telephone Encounter (Signed)
Spoke with pts spouse. He isn't sure pt followed up with nephrology. He isn't sure who that doctor is either. He asked that I check with her dialysis specialist.

## 2018-07-09 NOTE — Telephone Encounter (Signed)
Received a call back from [place]. Pt see's Dr. Clover Mealy instead and the contact number is [phone]. I spoke to her nurse and she will pulled the pts report of the u/s and will make sure that Dr. Clover Mealy is aware and take care of anything needed.

## 2018-07-10 DIAGNOSIS — E876 Hypokalemia: Secondary | ICD-10-CM | POA: Diagnosis not present

## 2018-07-10 DIAGNOSIS — D631 Anemia in chronic kidney disease: Secondary | ICD-10-CM | POA: Diagnosis not present

## 2018-07-10 DIAGNOSIS — D509 Iron deficiency anemia, unspecified: Secondary | ICD-10-CM | POA: Diagnosis not present

## 2018-07-10 DIAGNOSIS — D689 Coagulation defect, unspecified: Secondary | ICD-10-CM | POA: Diagnosis not present

## 2018-07-10 DIAGNOSIS — N2581 Secondary hyperparathyroidism of renal origin: Secondary | ICD-10-CM | POA: Diagnosis not present

## 2018-07-10 DIAGNOSIS — E1129 Type 2 diabetes mellitus with other diabetic kidney complication: Secondary | ICD-10-CM | POA: Diagnosis not present

## 2018-07-10 DIAGNOSIS — E039 Hypothyroidism, unspecified: Secondary | ICD-10-CM | POA: Diagnosis not present

## 2018-07-10 DIAGNOSIS — N186 End stage renal disease: Secondary | ICD-10-CM | POA: Diagnosis not present

## 2018-07-10 DIAGNOSIS — Z23 Encounter for immunization: Secondary | ICD-10-CM | POA: Diagnosis not present

## 2018-07-10 DIAGNOSIS — R52 Pain, unspecified: Secondary | ICD-10-CM | POA: Diagnosis not present

## 2018-07-11 DIAGNOSIS — Z4781 Encounter for orthopedic aftercare following surgical amputation: Secondary | ICD-10-CM | POA: Diagnosis not present

## 2018-07-12 ENCOUNTER — Ambulatory Visit (INDEPENDENT_AMBULATORY_CARE_PROVIDER_SITE_OTHER): Payer: Medicare Other | Admitting: Obstetrics and Gynecology

## 2018-07-12 ENCOUNTER — Other Ambulatory Visit: Payer: Self-pay | Admitting: "Endocrinology

## 2018-07-12 ENCOUNTER — Encounter: Payer: Self-pay | Admitting: Obstetrics and Gynecology

## 2018-07-12 VITALS — BP 142/56 | HR 72

## 2018-07-12 DIAGNOSIS — I872 Venous insufficiency (chronic) (peripheral): Secondary | ICD-10-CM | POA: Diagnosis not present

## 2018-07-12 DIAGNOSIS — Z89611 Acquired absence of right leg above knee: Secondary | ICD-10-CM | POA: Diagnosis not present

## 2018-07-12 DIAGNOSIS — A5901 Trichomonal vulvovaginitis: Secondary | ICD-10-CM

## 2018-07-12 DIAGNOSIS — E1151 Type 2 diabetes mellitus with diabetic peripheral angiopathy without gangrene: Secondary | ICD-10-CM | POA: Diagnosis not present

## 2018-07-12 DIAGNOSIS — M150 Primary generalized (osteo)arthritis: Secondary | ICD-10-CM | POA: Diagnosis not present

## 2018-07-12 DIAGNOSIS — H409 Unspecified glaucoma: Secondary | ICD-10-CM | POA: Diagnosis not present

## 2018-07-12 DIAGNOSIS — E11319 Type 2 diabetes mellitus with unspecified diabetic retinopathy without macular edema: Secondary | ICD-10-CM | POA: Diagnosis not present

## 2018-07-12 DIAGNOSIS — M1990 Unspecified osteoarthritis, unspecified site: Secondary | ICD-10-CM | POA: Diagnosis not present

## 2018-07-12 DIAGNOSIS — D631 Anemia in chronic kidney disease: Secondary | ICD-10-CM | POA: Diagnosis not present

## 2018-07-12 DIAGNOSIS — Z992 Dependence on renal dialysis: Secondary | ICD-10-CM | POA: Diagnosis not present

## 2018-07-12 DIAGNOSIS — Z86718 Personal history of other venous thrombosis and embolism: Secondary | ICD-10-CM | POA: Diagnosis not present

## 2018-07-12 DIAGNOSIS — N186 End stage renal disease: Secondary | ICD-10-CM | POA: Diagnosis not present

## 2018-07-12 DIAGNOSIS — E785 Hyperlipidemia, unspecified: Secondary | ICD-10-CM | POA: Diagnosis not present

## 2018-07-12 DIAGNOSIS — Z113 Encounter for screening for infections with a predominantly sexual mode of transmission: Secondary | ICD-10-CM | POA: Diagnosis not present

## 2018-07-12 DIAGNOSIS — M109 Gout, unspecified: Secondary | ICD-10-CM | POA: Diagnosis not present

## 2018-07-12 DIAGNOSIS — I4891 Unspecified atrial fibrillation: Secondary | ICD-10-CM | POA: Diagnosis not present

## 2018-07-12 DIAGNOSIS — Z4781 Encounter for orthopedic aftercare following surgical amputation: Secondary | ICD-10-CM | POA: Diagnosis not present

## 2018-07-12 DIAGNOSIS — J449 Chronic obstructive pulmonary disease, unspecified: Secondary | ICD-10-CM | POA: Diagnosis not present

## 2018-07-12 DIAGNOSIS — Z87891 Personal history of nicotine dependence: Secondary | ICD-10-CM | POA: Diagnosis not present

## 2018-07-12 DIAGNOSIS — I129 Hypertensive chronic kidney disease with stage 1 through stage 4 chronic kidney disease, or unspecified chronic kidney disease: Secondary | ICD-10-CM | POA: Diagnosis not present

## 2018-07-12 DIAGNOSIS — Z794 Long term (current) use of insulin: Secondary | ICD-10-CM | POA: Diagnosis not present

## 2018-07-12 DIAGNOSIS — E1122 Type 2 diabetes mellitus with diabetic chronic kidney disease: Secondary | ICD-10-CM | POA: Diagnosis not present

## 2018-07-12 DIAGNOSIS — M329 Systemic lupus erythematosus, unspecified: Secondary | ICD-10-CM | POA: Diagnosis not present

## 2018-07-12 DIAGNOSIS — E1142 Type 2 diabetes mellitus with diabetic polyneuropathy: Secondary | ICD-10-CM | POA: Diagnosis not present

## 2018-07-12 MED ORDER — METRONIDAZOLE 500 MG PO TABS: 500.0000 mg | ORAL_TABLET | Freq: Two times a day (BID) | ORAL | 0 refills | Status: DC

## 2018-07-12 NOTE — Progress Notes (Signed)
Patient ID: [name], female   DOB: 03-10-1946, 72 y.o.   MRN: [id number]    [place] [facility] Visit  @DATE @            Patient name: [name] MRN [id number]  Date of birth: [birth year]  CC & HPI:  [name] is a 72 y.o. female presenting today for vaginal discomfort. Describes the pain as " something is being cut". Pain has been going on for 2-3 months. Is here with her husband and pain keeps her from having sex with husband  Denies amputaion being related to diabetes  ROS:  ROS +yellow vaginal discharge -fever -chills  All systems are negative except as noted in the HPI and PMH.   Pertinent History Reviewed:   Reviewed:  Medical         Past Medical History:  Diagnosis Date  . Anemia   . Arthritis   . Asthma   . Atrial fibrillation ([place])   . Atrial flutter [facility])    TEE/DCCV December 2013 - on Xarelto [facility])  . Bone spur of toe    Right 5th toe  . Chronic diastolic CHF (congestive heart failure) (HCC)    a. EF 50-55% by echo in 2015  . CKD (chronic kidney disease) stage 4, GFR 15-29 ml/min (HCC)    a. s/p fistula placement but not on HD. Followed by Nephrology.   . Colon polyposis   . COPD (chronic obstructive pulmonary disease) ([place])   . DVT of upper extremity (deep vein thrombosis) ([place])    Right basilic vein, June 2706  . Essential hypertension, benign   . Fractures   . Glaucoma   . SLE (systemic lupus erythematosus) ([place])   . Type 2 diabetes mellitus ([place])   . UTI (lower urinary tract infection)   . Wound of right leg 06/2017                              Surgical Hx:    Past Surgical History:  Procedure Laterality Date  . ABDOMINAL AORTOGRAM W/LOWER EXTREMITY N/A 12/18/2017   Procedure: ABDOMINAL AORTOGRAM W/LOWER EXTREMITY;  Surgeon: Elam Dutch, MD;  Location: [place] [facility];  Service: Cardiovascular;  Laterality: N/A;  bilateral  . ABDOMINAL HYSTERECTOMY  1983  . AMPUTATION Right 06/08/2018   Procedure: RIGHT  ABOVE KNEE AMPUTATION;  Surgeon: Elam Dutch, MD;  Location: [place];  Service: Vascular;  Laterality: Right;  . ANKLE SURGERY  1993  . AV FISTULA PLACEMENT Left 03/27/2014   Procedure: ARTERIOVENOUS (AV) FISTULA CREATION;  Surgeon: Rosetta Posner, MD;  Location: [place];  Service: Vascular;  Laterality: Left;  . BACK SURGERY  1980  . BASCILIC VEIN TRANSPOSITION Left 01/13/2018   Procedure: LEFT BASILIC VEIN TRANSPOSITION SECOND STAGE;  Surgeon: Elam Dutch, MD;  Location: [place];  Service: Vascular;  Laterality: Left;  . CARDIOVASCULAR STRESS TEST  12/19/2009   no stress induced rhythm abnormalities, ekg negative for ischemia  . CARDIOVERSION  09/16/2012   Procedure: CARDIOVERSION;  Surgeon: Sanda Klein, MD;  [facility];  Service: Cardiovascular;  Laterality: N/A;  . CATARACT EXTRACTION    . COLONOSCOPY  09/20/2012   Dr. [name]: multiple tubular adenomas   . COLONOSCOPY  2005   Dr. [name]. Polyps, path unknown   . COLONOSCOPY N/A 07/24/2016   Dr. [name]: 3 significant size polyps removed from the colon, tubular adenomas.  Next colonoscopy  in October 2020.  Marland Kitchen ESOPHAGOGASTRODUODENOSCOPY  09/20/2012   Dr. [name]: duodenal erosion and possible resolving ulcer at the angularis   . ESOPHAGOGASTRODUODENOSCOPY N/A 03/24/2018   Dr. [name]: Mild erosive reflux esophagitis, erosive gastropathy and enteropathy fairly extensive, no H. pylori on biopsy.  Marland Kitchen EYE SURGERY    . IR FLUORO GUIDE CV LINE RIGHT  11/09/2017  . IR US GUIDE VASC ACCESS RIGHT  11/09/2017  . REVISON OF ARTERIOVENOUS FISTULA Left 03/15/2018   Procedure: REVISION OF Left arm BASILIC VEIN TRANSPOSITION;  Surgeon: Angelia Mould, MD;  Location: [place];  Service: Vascular;  Laterality: Left;  . TEE WITHOUT CARDIOVERSION  09/16/2012   Procedure: TRANSESOPHAGEAL ECHOCARDIOGRAM (TEE);  Surgeon: Sanda Klein, MD;  Location:  [facility] ENDOSCOPY;  Service: Cardiovascular;  Laterality: N/A;  . TRANSESOPHAGEAL ECHOCARDIOGRAM WITH  CARDIOVERSION  09/16/2012   EF 60-65%, moderate LVH, moderate regurg of the aortic valve, LA moderately dilated   Medications: Reviewed & Updated - see associated section                       Current Outpatient Medications:  .  acetaminophen (TYLENOL) 325 MG tablet, Take 1-2 tablets (325-650 mg total) by mouth every 4 (four) hours as needed for mild pain., Disp: , Rfl:  .  albuterol (PROVENTIL HFA;VENTOLIN HFA) 108 (90 BASE) MCG/ACT inhaler, Inhale 2 puffs into the lungs every 6 (six) hours as needed for shortness of breath. , Disp: , Rfl:  .  allopurinol (ZYLOPRIM) 100 MG tablet, Take 100 mg by mouth every Monday, Wednesday, and Friday. , Disp: , Rfl:  .  Amino Acids-Protein Hydrolys (FEEDING SUPPLEMENT, PRO-STAT SUGAR FREE 64,) LIQD, Take 30 mLs by mouth 2 (two) times daily., Disp: 900 mL, Rfl: 0 .  amiodarone (PACERONE) 200 MG tablet, Take 200 mg twice a day for 2 weeks, THEN take 200 mg daily thereafter, Disp: 45 tablet, Rfl: 6 .  apixaban (ELIQUIS) 5 MG TABS tablet, Take 1 tablet (5 mg total) by mouth 2 (two) times daily., Disp: 60 tablet, Rfl: 6 .  atorvastatin (LIPITOR) 20 MG tablet, Take 20 mg by mouth at bedtime. , Disp: , Rfl:  .  brimonidine-timolol (COMBIGAN) 0.2-0.5 % ophthalmic solution, Place 1 drop into the left eye 2 (two) times daily. , Disp: , Rfl:  .  dicyclomine (BENTYL) 10 MG capsule, Take 10 mg by mouth 4 (four) times daily -  before meals and at bedtime., Disp: , Rfl:  .  fentaNYL (DURAGESIC - DOSED MCG/HR) 50 MCG/HR, Place 25 mcg onto the skin every 3 (three) days. , Disp: , Rfl:  .  folic acid (FOLVITE) 1 MG tablet, Take 1 mg by mouth daily., Disp: , Rfl:  .  hydroxychloroquine (PLAQUENIL) 200 MG tablet, Take 200 mg by mouth daily. , Disp: , Rfl:  .  Insulin Detemir (LEVEMIR FLEXTOUCH) 100 UNIT/ML Pen, Inject 5 Units into the skin every morning., Disp: 15 mL, Rfl: 11 .  insulin lispro (HUMALOG KWIKPEN) 100 UNIT/ML KiwkPen, Inject 5 Units into the skin 3 (three) times  daily with meals as needed (*Only take if blood sugar levels are over 100). , Disp: , Rfl:  .  metoprolol tartrate (LOPRESSOR) 25 MG tablet, Take 1/2 tablet twice a day on Mondays, Wednesdays and Fridays, Disp: 30 tablet, Rfl: 0 .  oxyCODONE-acetaminophen (PERCOCET/ROXICET) 5-325 MG tablet, Take 1 tablet by mouth every 6 (six) hours as needed for severe pain., Disp: 28 tablet, Rfl: 0 .  pantoprazole (PROTONIX) 40 MG  tablet, Take 1 tablet (40 mg total) by mouth daily., Disp: 30 tablet, Rfl: 11 .  predniSONE (DELTASONE) 5 MG tablet, Take 5 mg by mouth daily with breakfast., Disp: , Rfl:  .  travoprost, benzalkonium, (TRAVATAN) 0.004 % ophthalmic solution, Place 1 drop into both eyes at bedtime. , Disp: , Rfl:    Social History: Reviewed -  reports that she quit smoking about 5 years ago. Her smoking use included cigarettes. She has a 30.00 pack-year smoking history. She has never used smokeless tobacco.  Objective Findings:  Vitals: There were no vitals taken for this visit.  PHYSICAL EXAMINATION General appearance - alert, well appearing, and in no distress and oriented to person, place, and time Mental status - alert, oriented to person, place, and time, normal mood, behavior, speech, dress, motor activity, and thought processes, affect appropriate to mood  PELVIC External genitalia - 1.5 cm sebaceous cyst on labia majora Vagina - Yellow vaginal discharge, well healed top of vagina, good vaginal length Uterus - surgically absent [place] - positive Trichomonas    Assessment & Plan:   A:  1. Trichomonas vaginitis  P:  1.  Rx metronidazole 500 bid x 7 d pt and partner. 2. F/u 4 weeks proof of cure    By signing my name below, I, Samul Dada, attest that this documentation has been prepared under the direction and in the presence of Jonnie Kind, MD. Electronically Signed: [place]. 07/12/18. 1:55 PM.  I personally performed the services described in this  documentation, which was SCRIBED in my presence. The recorded information has been reviewed and considered accurate. It has been edited as necessary during review. Jonnie Kind, MD

## 2018-07-12 NOTE — Addendum Note (Signed)
Addended by: Diona Fanti A on: 07/12/2018 03:32 PM   Modules accepted: Orders

## 2018-07-12 NOTE — Patient Instructions (Signed)
Trichomoniasis °Trichomoniasis is an STI (sexually transmitted infection) that can affect both women and men. In women, the outer area of the female genitalia (vulva) and the vagina are affected. In men, the penis is mainly affected, but the prostate and other reproductive organs can also be involved. This condition can be treated with medicine. It often has no symptoms (is asymptomatic), especially in men. °What are the causes? °This condition is caused by an organism called Trichomonas vaginalis. Trichomoniasis most often spreads from person to person (is contagious) through sexual contact. °What increases the risk? °The following factors may make you more likely to develop this condition: °· Having unprotected sexual intercourse. °· Having sexual intercourse with a partner who has trichomoniasis. °· Having multiple sexual partners. °· Having had previous trichomoniasis infections or other STIs. ° °What are the signs or symptoms? °In women, symptoms of trichomoniasis include: °· Abnormal vaginal discharge that is clear, white, gray, or yellow-green and foamy and has an unusual "fishy" odor. °· Itching and irritation of the vagina and vulva. °· Burning or pain during urination or sexual intercourse. °· Genital redness and swelling. ° °In men, symptoms of trichomoniasis include: °· Penile discharge that may be foamy or contain pus. °· Pain in the penis. This may happen only when urinating. °· Itching or irritation inside the penis. °· Burning after urination or ejaculation. ° °How is this diagnosed? °In women, this condition may be found during a routine Pap test or physical exam. It may be found in men during a routine physical exam. Your health care provider may perform tests to help diagnose this infection, such as: °· Urine tests (men and women). °· The following in women: °? Testing the pH of the vagina. °? A vaginal swab test that checks for the Trichomonas vaginalis organism. °? Testing vaginal  secretions. ° °Your health care provider may test you for other STIs, including HIV (human immunodeficiency virus). °How is this treated? °This condition is treated with medicine taken by mouth (orally), such as metronidazole or tinidazole to fight the infection. Your sexual partner(s) may also need to be tested and treated. °· If you are a woman and you plan to become pregnant or think you may be pregnant, tell your health care provider right away. Some medicines that are used to treat the infection should not be taken during pregnancy. ° °Your health care provider may recommend over-the-counter medicines or creams to help relieve itching or irritation. You may be tested for infection again 3 months after treatment. °Follow these instructions at home: °· Take and use over-the-counter and prescription medicines, including creams, only as told by your health care provider. °· Do not have sexual intercourse until one week after you finish your medicine, or until your health care provider approves. Ask your health care provider when you may resume sexual intercourse. °· (Women) Do not douche or wear tampons while you have the infection. °· Discuss your infection with your sexual partner(s). Make sure that your partner gets tested and treated, if necessary. °· Keep all follow-up visits as told by your health care provider. This is important. °How is this prevented? °· Use condoms every time you have sex. Using condoms correctly and consistently can help protect against STIs. °· Avoid having multiple sexual partners. °· Talk with your sexual partner about any symptoms that either of you may have, as well as any history of STIs. °· Get tested for STIs and STDs (sexually transmitted diseases) before you have sex. Ask your partner   to do the same. °· Do not have sexual contact if you have symptoms of trichomoniasis or another STI. °Contact a health care provider if: °· You still have symptoms after you finish your  medicine. °· You develop pain in your abdomen. °· You have pain when you urinate. °· You have bleeding after sexual intercourse. °· You develop a rash. °· You feel nauseous or you vomit. °· You plan to become pregnant or think you may be pregnant. °Summary °· Trichomoniasis is an STI (sexually transmitted infection) that can affect both women and men. °· This condition often has no symptoms (is asymptomatic), especially in men. °· You should not have sexual intercourse until one week after you finish your medicine, or until your health care provider approves. Ask your health care provider when you may resume sexual intercourse. °· Discuss your infection with your sexual partner. Make sure that your partner gets tested and treated, if necessary. °This information is not intended to replace advice given to you by your health care provider. Make sure you discuss any questions you have with your health care provider. °Document Released: 03/11/2001 Document Revised: 08/08/2016 Document Reviewed: 08/08/2016 °Elsevier Interactive Patient Education © 2017 [employer] ° °

## 2018-07-12 NOTE — Telephone Encounter (Addendum)
Noted prior notations.   Please let patient know that I discussed her chronic RLQ pain with Dr. [name]. At this point, he advises updating a colonoscopy with ileoscopy to rule out something in colon/small bowel to explain her RLQ pain.   If agreeable to TCS/ileoscopy: -Hold Eliquis 48 hours before. -day before tcs: Levemir 2 units daily, humalog 2 units tid with meals as needed  -am of tcs: hold Levemir and Humalog -must have Trilyte due to kidney disease -if she is having constipation, let me know she we can address prior to her bowel prep

## 2018-07-12 NOTE — Telephone Encounter (Signed)
Noted. Spoke with spouse and pt. They want to see what the pts kidney doctor recommends about her kidney first. Pt states she just can't do anything else right now. Spouse states she has an appointment every day that she's not at dialysis. They are aware that they can think about the procedure and call us back.

## 2018-07-13 DIAGNOSIS — D509 Iron deficiency anemia, unspecified: Secondary | ICD-10-CM | POA: Diagnosis not present

## 2018-07-13 DIAGNOSIS — D631 Anemia in chronic kidney disease: Secondary | ICD-10-CM | POA: Diagnosis not present

## 2018-07-13 DIAGNOSIS — N186 End stage renal disease: Secondary | ICD-10-CM | POA: Diagnosis not present

## 2018-07-13 DIAGNOSIS — D689 Coagulation defect, unspecified: Secondary | ICD-10-CM | POA: Diagnosis not present

## 2018-07-13 DIAGNOSIS — N2581 Secondary hyperparathyroidism of renal origin: Secondary | ICD-10-CM | POA: Diagnosis not present

## 2018-07-13 DIAGNOSIS — R52 Pain, unspecified: Secondary | ICD-10-CM | POA: Diagnosis not present

## 2018-07-13 DIAGNOSIS — Z23 Encounter for immunization: Secondary | ICD-10-CM | POA: Diagnosis not present

## 2018-07-13 DIAGNOSIS — E1129 Type 2 diabetes mellitus with other diabetic kidney complication: Secondary | ICD-10-CM | POA: Diagnosis not present

## 2018-07-13 DIAGNOSIS — E876 Hypokalemia: Secondary | ICD-10-CM | POA: Diagnosis not present

## 2018-07-13 DIAGNOSIS — E039 Hypothyroidism, unspecified: Secondary | ICD-10-CM | POA: Diagnosis not present

## 2018-07-14 ENCOUNTER — Encounter: Payer: Self-pay | Admitting: Vascular Surgery

## 2018-07-14 ENCOUNTER — Ambulatory Visit (INDEPENDENT_AMBULATORY_CARE_PROVIDER_SITE_OTHER): Payer: Medicare Other | Admitting: Vascular Surgery

## 2018-07-14 VITALS — BP 127/53 | HR 78 | Temp 97.2°F | Resp 16

## 2018-07-14 DIAGNOSIS — E11319 Type 2 diabetes mellitus with unspecified diabetic retinopathy without macular edema: Secondary | ICD-10-CM | POA: Diagnosis not present

## 2018-07-14 DIAGNOSIS — M150 Primary generalized (osteo)arthritis: Secondary | ICD-10-CM | POA: Diagnosis not present

## 2018-07-14 DIAGNOSIS — I872 Venous insufficiency (chronic) (peripheral): Secondary | ICD-10-CM | POA: Diagnosis not present

## 2018-07-14 DIAGNOSIS — E785 Hyperlipidemia, unspecified: Secondary | ICD-10-CM | POA: Diagnosis not present

## 2018-07-14 DIAGNOSIS — Z992 Dependence on renal dialysis: Secondary | ICD-10-CM | POA: Diagnosis not present

## 2018-07-14 DIAGNOSIS — E1142 Type 2 diabetes mellitus with diabetic polyneuropathy: Secondary | ICD-10-CM | POA: Diagnosis not present

## 2018-07-14 DIAGNOSIS — N186 End stage renal disease: Secondary | ICD-10-CM | POA: Diagnosis not present

## 2018-07-14 DIAGNOSIS — E1122 Type 2 diabetes mellitus with diabetic chronic kidney disease: Secondary | ICD-10-CM | POA: Diagnosis not present

## 2018-07-14 DIAGNOSIS — M329 Systemic lupus erythematosus, unspecified: Secondary | ICD-10-CM | POA: Diagnosis not present

## 2018-07-14 DIAGNOSIS — E1151 Type 2 diabetes mellitus with diabetic peripheral angiopathy without gangrene: Secondary | ICD-10-CM | POA: Diagnosis not present

## 2018-07-14 DIAGNOSIS — Z89611 Acquired absence of right leg above knee: Secondary | ICD-10-CM | POA: Diagnosis not present

## 2018-07-14 DIAGNOSIS — D631 Anemia in chronic kidney disease: Secondary | ICD-10-CM | POA: Diagnosis not present

## 2018-07-14 DIAGNOSIS — I129 Hypertensive chronic kidney disease with stage 1 through stage 4 chronic kidney disease, or unspecified chronic kidney disease: Secondary | ICD-10-CM | POA: Diagnosis not present

## 2018-07-14 DIAGNOSIS — Z4781 Encounter for orthopedic aftercare following surgical amputation: Secondary | ICD-10-CM | POA: Diagnosis not present

## 2018-07-14 DIAGNOSIS — M109 Gout, unspecified: Secondary | ICD-10-CM | POA: Diagnosis not present

## 2018-07-14 DIAGNOSIS — Z87891 Personal history of nicotine dependence: Secondary | ICD-10-CM | POA: Diagnosis not present

## 2018-07-14 DIAGNOSIS — J449 Chronic obstructive pulmonary disease, unspecified: Secondary | ICD-10-CM | POA: Diagnosis not present

## 2018-07-14 DIAGNOSIS — Z86718 Personal history of other venous thrombosis and embolism: Secondary | ICD-10-CM | POA: Diagnosis not present

## 2018-07-14 DIAGNOSIS — Z48812 Encounter for surgical aftercare following surgery on the circulatory system: Secondary | ICD-10-CM

## 2018-07-14 DIAGNOSIS — Z794 Long term (current) use of insulin: Secondary | ICD-10-CM | POA: Diagnosis not present

## 2018-07-14 DIAGNOSIS — I4891 Unspecified atrial fibrillation: Secondary | ICD-10-CM | POA: Diagnosis not present

## 2018-07-14 DIAGNOSIS — M1990 Unspecified osteoarthritis, unspecified site: Secondary | ICD-10-CM | POA: Diagnosis not present

## 2018-07-14 DIAGNOSIS — H409 Unspecified glaucoma: Secondary | ICD-10-CM | POA: Diagnosis not present

## 2018-07-14 LAB — GC/CHLAMYDIA PROBE AMP
Chlamydia trachomatis, NAA: NEGATIVE
Neisseria gonorrhoeae by PCR: NEGATIVE

## 2018-07-14 NOTE — Progress Notes (Signed)
Patient name: [name] MRN: [id number] DOB: [birth year] Sex: female  REASON FOR VISIT:   Follow-up after right above-the-knee amputation.  HPI:   [name] is a pleasant 72 y.o. female who had a right above-the-knee amputation by Dr. [name] on 06/08/2018.  She comes in today to have her staples removed.  Of note she was seen in consultation by Dr. Donzetta Matters on 06/04/2018 with bilateral leg wounds.  Given the extent of the wound of the right foot it was felt that she would require right above-the-knee amputation.  There were no revascularization options on the left based on her previous arteriogram that was done in March of this year.  She had dry gangrene of the toes of the left foot.  The patient is on dialysis and dialyzes on Tuesdays Thursdays and Saturdays.  She is nonambulatory.  She has had no complaints referrable to her right above-the-knee amputation.  She has really no significant pain where she has dry gangrene of the left forefoot.  She denies fever or chills.  Current Outpatient Medications  Medication Sig Dispense Refill  . acetaminophen (TYLENOL) 325 MG tablet Take 1-2 tablets (325-650 mg total) by mouth every 4 (four) hours as needed for mild pain.    Marland Kitchen albuterol (PROVENTIL HFA;VENTOLIN HFA) 108 (90 BASE) MCG/ACT inhaler Inhale 2 puffs into the lungs every 6 (six) hours as needed for shortness of breath.     . allopurinol (ZYLOPRIM) 100 MG tablet Take 100 mg by mouth every Monday, Wednesday, and Friday.     . Amino Acids-Protein Hydrolys (FEEDING SUPPLEMENT, PRO-STAT SUGAR FREE 64,) LIQD Take 30 mLs by mouth 2 (two) times daily. 900 mL 0  . amiodarone (PACERONE) 200 MG tablet Take 200 mg twice a day for 2 weeks, THEN take 200 mg daily thereafter 45 tablet 6  . apixaban (ELIQUIS) 5 MG TABS tablet Take 1 tablet (5 mg total) by mouth 2 (two) times daily. 60 tablet 6  . atorvastatin (LIPITOR) 20 MG tablet Take 20 mg by mouth at bedtime.     .  brimonidine-timolol (COMBIGAN) 0.2-0.5 % ophthalmic solution Place 1 drop into the left eye 2 (two) times daily.     Marland Kitchen dicyclomine (BENTYL) 10 MG capsule Take 10 mg by mouth 4 (four) times daily -  before meals and at bedtime.    . fentaNYL (DURAGESIC - DOSED MCG/HR) 50 MCG/HR Place 25 mcg onto the skin every 3 (three) days.     . folic acid (FOLVITE) 1 MG tablet Take 1 mg by mouth daily.    . hydroxychloroquine (PLAQUENIL) 200 MG tablet Take 200 mg by mouth daily.     . Insulin Detemir (LEVEMIR FLEXTOUCH) 100 UNIT/ML Pen Inject 5 Units into the skin every morning. 15 mL 11  . insulin lispro (HUMALOG KWIKPEN) 100 UNIT/ML KiwkPen Inject 5 Units into the skin 3 (three) times daily with meals as needed (*Only take if blood sugar levels are over 100).     . metoprolol tartrate (LOPRESSOR) 25 MG tablet Take 1/2 tablet twice a day on Mondays, Wednesdays and Fridays 30 tablet 0  . metroNIDAZOLE (FLAGYL) 500 MG tablet Take 1 tablet (500 mg total) by mouth 2 (two) times daily. For patient and partner, (expedited partner therapy) 28 tablet 0  . ONE TOUCH ULTRA TEST test strip TEST 4 TIMES A DAY AS DIRECTED. 150 each 5  . oxyCODONE-acetaminophen (PERCOCET/ROXICET) 5-325 MG tablet Take 1 tablet by mouth every 6 (six) hours as needed for  severe pain. 28 tablet 0  . pantoprazole (PROTONIX) 40 MG tablet Take 1 tablet (40 mg total) by mouth daily. 30 tablet 11  . predniSONE (DELTASONE) 5 MG tablet Take 5 mg by mouth daily with breakfast.    . travoprost, benzalkonium, (TRAVATAN) 0.004 % ophthalmic solution Place 1 drop into both eyes at bedtime.      No current facility-administered medications for this visit.     REVIEW OF SYSTEMS:  [X]  denotes positive finding, [ ]  denotes negative finding Vascular    Leg swelling    Cardiac    Chest pain or chest pressure:    Shortness of breath upon exertion:    Short of breath when lying flat:    Irregular heart rhythm:    Constitutional    Fever or chills:      PHYSICAL EXAM:   Vitals:   07/14/18 1519  BP: (!) 127/53  Pulse: 78  Resp: 16  Temp: (!) 97.2 F (36.2 C)  TempSrc: Oral  SpO2: 99%    GENERAL: The patient is a well-nourished female, in no acute distress. The vital signs are documented above. CARDIOVASCULAR: There is a regular rate and rhythm. PULMONARY: There is good air exchange bilaterally without wheezing or rales. VASCULAR: She has a monophasic posterior tibial signal on the left with the Doppler and a surprisingly brisk proximal dorsalis pedis signal on the left with the Doppler. She has dry gangrene of all of the toes of the left foot. Her right above-the-knee amputation is healing adequately.  DATA:   I did review her previous arteriogram.  On the left side she had no significant occlusive disease down to the proximal tibials.  The posterior tibial was patent down to the ankle where was occluded.  The peroneal and anterior tibial arteries were occluded.  Her arteries were markedly calcified.  She was not a candidate for revascularization.  MEDICAL ISSUES:   STATUS POST RIGHT ABOVE-THE-KNEE AMPUTATION: This is healing adequately.  She is one-month postop and we removed her staples in the office today.  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  Given her diabetes, end-stage renal disease, and tibial artery occlusive disease, I think she is at high risk for not healing a transmetatarsal amputation.  Given her debilitated state I think she would not be a good candidate for bilateral prostheses and therefore probably the safest option on the left would be an above-the-knee amputation.  I am just seeing her for the first time about this problem and obviously she is upset as is her family member who is with her.  They would like to discuss this further with Dr. Dellia Nims at the wound care center which I think is  reasonable.  They would prefer to see Dr. Oneida Alar but he is not here on a nondialysis day.  I have offered to get an appointment in the morning but she cannot make it in the morning.  Her dialysis is in the afternoon.  I have offered to see her in 2 weeks to discuss this again.  I do not think it would be unreasonable to try a transmetatarsal amputation but I think the chances of healing are probably less than 50%.  However we will discuss this further when she returns.  Currently she has no evidence of active infection.  There is no erythema or drainage.  [name] [facility]  [employer] [phone]

## 2018-07-15 ENCOUNTER — Encounter: Payer: Medicare Other | Admitting: Vascular Surgery

## 2018-07-15 ENCOUNTER — Other Ambulatory Visit (HOSPITAL_COMMUNITY): Payer: Self-pay | Admitting: Nephrology

## 2018-07-15 DIAGNOSIS — R1031 Right lower quadrant pain: Secondary | ICD-10-CM

## 2018-07-15 DIAGNOSIS — Z23 Encounter for immunization: Secondary | ICD-10-CM | POA: Diagnosis not present

## 2018-07-15 DIAGNOSIS — E039 Hypothyroidism, unspecified: Secondary | ICD-10-CM | POA: Diagnosis not present

## 2018-07-15 DIAGNOSIS — N2581 Secondary hyperparathyroidism of renal origin: Secondary | ICD-10-CM | POA: Diagnosis not present

## 2018-07-15 DIAGNOSIS — N186 End stage renal disease: Secondary | ICD-10-CM | POA: Diagnosis not present

## 2018-07-15 DIAGNOSIS — D631 Anemia in chronic kidney disease: Secondary | ICD-10-CM | POA: Diagnosis not present

## 2018-07-15 DIAGNOSIS — R52 Pain, unspecified: Secondary | ICD-10-CM | POA: Diagnosis not present

## 2018-07-15 DIAGNOSIS — D509 Iron deficiency anemia, unspecified: Secondary | ICD-10-CM | POA: Diagnosis not present

## 2018-07-15 DIAGNOSIS — D689 Coagulation defect, unspecified: Secondary | ICD-10-CM | POA: Diagnosis not present

## 2018-07-15 DIAGNOSIS — E876 Hypokalemia: Secondary | ICD-10-CM | POA: Diagnosis not present

## 2018-07-15 DIAGNOSIS — E1129 Type 2 diabetes mellitus with other diabetic kidney complication: Secondary | ICD-10-CM | POA: Diagnosis not present

## 2018-07-15 NOTE — Telephone Encounter (Signed)
Noted  

## 2018-07-16 DIAGNOSIS — H35351 Cystoid macular degeneration, right eye: Secondary | ICD-10-CM | POA: Diagnosis not present

## 2018-07-16 DIAGNOSIS — H401111 Primary open-angle glaucoma, right eye, mild stage: Secondary | ICD-10-CM | POA: Diagnosis not present

## 2018-07-16 DIAGNOSIS — Z79899 Other long term (current) drug therapy: Secondary | ICD-10-CM | POA: Diagnosis not present

## 2018-07-16 DIAGNOSIS — M329 Systemic lupus erythematosus, unspecified: Secondary | ICD-10-CM | POA: Diagnosis not present

## 2018-07-16 DIAGNOSIS — H401123 Primary open-angle glaucoma, left eye, severe stage: Secondary | ICD-10-CM | POA: Diagnosis not present

## 2018-07-17 DIAGNOSIS — D509 Iron deficiency anemia, unspecified: Secondary | ICD-10-CM | POA: Diagnosis not present

## 2018-07-17 DIAGNOSIS — N186 End stage renal disease: Secondary | ICD-10-CM | POA: Diagnosis not present

## 2018-07-17 DIAGNOSIS — D631 Anemia in chronic kidney disease: Secondary | ICD-10-CM | POA: Diagnosis not present

## 2018-07-17 DIAGNOSIS — D689 Coagulation defect, unspecified: Secondary | ICD-10-CM | POA: Diagnosis not present

## 2018-07-17 DIAGNOSIS — N2581 Secondary hyperparathyroidism of renal origin: Secondary | ICD-10-CM | POA: Diagnosis not present

## 2018-07-17 DIAGNOSIS — Z23 Encounter for immunization: Secondary | ICD-10-CM | POA: Diagnosis not present

## 2018-07-17 DIAGNOSIS — E876 Hypokalemia: Secondary | ICD-10-CM | POA: Diagnosis not present

## 2018-07-17 DIAGNOSIS — E039 Hypothyroidism, unspecified: Secondary | ICD-10-CM | POA: Diagnosis not present

## 2018-07-17 DIAGNOSIS — R52 Pain, unspecified: Secondary | ICD-10-CM | POA: Diagnosis not present

## 2018-07-17 DIAGNOSIS — E1129 Type 2 diabetes mellitus with other diabetic kidney complication: Secondary | ICD-10-CM | POA: Diagnosis not present

## 2018-07-19 DIAGNOSIS — Z794 Long term (current) use of insulin: Secondary | ICD-10-CM | POA: Diagnosis not present

## 2018-07-19 DIAGNOSIS — E785 Hyperlipidemia, unspecified: Secondary | ICD-10-CM | POA: Diagnosis not present

## 2018-07-19 DIAGNOSIS — E11319 Type 2 diabetes mellitus with unspecified diabetic retinopathy without macular edema: Secondary | ICD-10-CM | POA: Diagnosis not present

## 2018-07-19 DIAGNOSIS — J449 Chronic obstructive pulmonary disease, unspecified: Secondary | ICD-10-CM | POA: Diagnosis not present

## 2018-07-19 DIAGNOSIS — E1142 Type 2 diabetes mellitus with diabetic polyneuropathy: Secondary | ICD-10-CM | POA: Diagnosis not present

## 2018-07-19 DIAGNOSIS — D631 Anemia in chronic kidney disease: Secondary | ICD-10-CM | POA: Diagnosis not present

## 2018-07-19 DIAGNOSIS — I4891 Unspecified atrial fibrillation: Secondary | ICD-10-CM | POA: Diagnosis not present

## 2018-07-19 DIAGNOSIS — M329 Systemic lupus erythematosus, unspecified: Secondary | ICD-10-CM | POA: Diagnosis not present

## 2018-07-19 DIAGNOSIS — Z87891 Personal history of nicotine dependence: Secondary | ICD-10-CM | POA: Diagnosis not present

## 2018-07-19 DIAGNOSIS — Z89611 Acquired absence of right leg above knee: Secondary | ICD-10-CM | POA: Diagnosis not present

## 2018-07-19 DIAGNOSIS — E1151 Type 2 diabetes mellitus with diabetic peripheral angiopathy without gangrene: Secondary | ICD-10-CM | POA: Diagnosis not present

## 2018-07-19 DIAGNOSIS — I129 Hypertensive chronic kidney disease with stage 1 through stage 4 chronic kidney disease, or unspecified chronic kidney disease: Secondary | ICD-10-CM | POA: Diagnosis not present

## 2018-07-19 DIAGNOSIS — E1122 Type 2 diabetes mellitus with diabetic chronic kidney disease: Secondary | ICD-10-CM | POA: Diagnosis not present

## 2018-07-19 DIAGNOSIS — M150 Primary generalized (osteo)arthritis: Secondary | ICD-10-CM | POA: Diagnosis not present

## 2018-07-19 DIAGNOSIS — M1990 Unspecified osteoarthritis, unspecified site: Secondary | ICD-10-CM | POA: Diagnosis not present

## 2018-07-19 DIAGNOSIS — Z992 Dependence on renal dialysis: Secondary | ICD-10-CM | POA: Diagnosis not present

## 2018-07-19 DIAGNOSIS — H409 Unspecified glaucoma: Secondary | ICD-10-CM | POA: Diagnosis not present

## 2018-07-19 DIAGNOSIS — M109 Gout, unspecified: Secondary | ICD-10-CM | POA: Diagnosis not present

## 2018-07-19 DIAGNOSIS — N186 End stage renal disease: Secondary | ICD-10-CM | POA: Diagnosis not present

## 2018-07-19 DIAGNOSIS — I872 Venous insufficiency (chronic) (peripheral): Secondary | ICD-10-CM | POA: Diagnosis not present

## 2018-07-19 DIAGNOSIS — Z4781 Encounter for orthopedic aftercare following surgical amputation: Secondary | ICD-10-CM | POA: Diagnosis not present

## 2018-07-19 DIAGNOSIS — Z86718 Personal history of other venous thrombosis and embolism: Secondary | ICD-10-CM | POA: Diagnosis not present

## 2018-07-20 ENCOUNTER — Encounter: Payer: Self-pay | Admitting: *Deleted

## 2018-07-20 DIAGNOSIS — Z23 Encounter for immunization: Secondary | ICD-10-CM | POA: Diagnosis not present

## 2018-07-20 DIAGNOSIS — E039 Hypothyroidism, unspecified: Secondary | ICD-10-CM | POA: Diagnosis not present

## 2018-07-20 DIAGNOSIS — N186 End stage renal disease: Secondary | ICD-10-CM | POA: Diagnosis not present

## 2018-07-20 DIAGNOSIS — D689 Coagulation defect, unspecified: Secondary | ICD-10-CM | POA: Diagnosis not present

## 2018-07-20 DIAGNOSIS — N2581 Secondary hyperparathyroidism of renal origin: Secondary | ICD-10-CM | POA: Diagnosis not present

## 2018-07-20 DIAGNOSIS — E876 Hypokalemia: Secondary | ICD-10-CM | POA: Diagnosis not present

## 2018-07-20 DIAGNOSIS — E1129 Type 2 diabetes mellitus with other diabetic kidney complication: Secondary | ICD-10-CM | POA: Diagnosis not present

## 2018-07-20 DIAGNOSIS — R52 Pain, unspecified: Secondary | ICD-10-CM | POA: Diagnosis not present

## 2018-07-20 DIAGNOSIS — D509 Iron deficiency anemia, unspecified: Secondary | ICD-10-CM | POA: Diagnosis not present

## 2018-07-20 DIAGNOSIS — D631 Anemia in chronic kidney disease: Secondary | ICD-10-CM | POA: Diagnosis not present

## 2018-07-21 ENCOUNTER — Encounter: Payer: Medicare Other | Attending: Internal Medicine | Admitting: Internal Medicine

## 2018-07-21 DIAGNOSIS — Z87891 Personal history of nicotine dependence: Secondary | ICD-10-CM | POA: Insufficient documentation

## 2018-07-21 DIAGNOSIS — Z841 Family history of disorders of kidney and ureter: Secondary | ICD-10-CM | POA: Insufficient documentation

## 2018-07-21 DIAGNOSIS — E785 Hyperlipidemia, unspecified: Secondary | ICD-10-CM | POA: Diagnosis not present

## 2018-07-21 DIAGNOSIS — Z4781 Encounter for orthopedic aftercare following surgical amputation: Secondary | ICD-10-CM | POA: Diagnosis not present

## 2018-07-21 DIAGNOSIS — Z833 Family history of diabetes mellitus: Secondary | ICD-10-CM | POA: Diagnosis not present

## 2018-07-21 DIAGNOSIS — Z7901 Long term (current) use of anticoagulants: Secondary | ICD-10-CM | POA: Diagnosis not present

## 2018-07-21 DIAGNOSIS — I96 Gangrene, not elsewhere classified: Secondary | ICD-10-CM | POA: Diagnosis not present

## 2018-07-21 DIAGNOSIS — D6861 Antiphospholipid syndrome: Secondary | ICD-10-CM | POA: Diagnosis not present

## 2018-07-21 DIAGNOSIS — M1990 Unspecified osteoarthritis, unspecified site: Secondary | ICD-10-CM | POA: Diagnosis not present

## 2018-07-21 DIAGNOSIS — I129 Hypertensive chronic kidney disease with stage 1 through stage 4 chronic kidney disease, or unspecified chronic kidney disease: Secondary | ICD-10-CM | POA: Diagnosis not present

## 2018-07-21 DIAGNOSIS — I120 Hypertensive chronic kidney disease with stage 5 chronic kidney disease or end stage renal disease: Secondary | ICD-10-CM | POA: Insufficient documentation

## 2018-07-21 DIAGNOSIS — E1152 Type 2 diabetes mellitus with diabetic peripheral angiopathy with gangrene: Secondary | ICD-10-CM | POA: Insufficient documentation

## 2018-07-21 DIAGNOSIS — M109 Gout, unspecified: Secondary | ICD-10-CM | POA: Diagnosis not present

## 2018-07-21 DIAGNOSIS — E1151 Type 2 diabetes mellitus with diabetic peripheral angiopathy without gangrene: Secondary | ICD-10-CM | POA: Diagnosis not present

## 2018-07-21 DIAGNOSIS — L539 Erythematous condition, unspecified: Secondary | ICD-10-CM | POA: Diagnosis not present

## 2018-07-21 DIAGNOSIS — D631 Anemia in chronic kidney disease: Secondary | ICD-10-CM | POA: Diagnosis not present

## 2018-07-21 DIAGNOSIS — M069 Rheumatoid arthritis, unspecified: Secondary | ICD-10-CM | POA: Insufficient documentation

## 2018-07-21 DIAGNOSIS — Z89611 Acquired absence of right leg above knee: Secondary | ICD-10-CM | POA: Insufficient documentation

## 2018-07-21 DIAGNOSIS — Z86718 Personal history of other venous thrombosis and embolism: Secondary | ICD-10-CM | POA: Diagnosis not present

## 2018-07-21 DIAGNOSIS — E11621 Type 2 diabetes mellitus with foot ulcer: Secondary | ICD-10-CM | POA: Insufficient documentation

## 2018-07-21 DIAGNOSIS — Z794 Long term (current) use of insulin: Secondary | ICD-10-CM | POA: Insufficient documentation

## 2018-07-21 DIAGNOSIS — Z992 Dependence on renal dialysis: Secondary | ICD-10-CM | POA: Insufficient documentation

## 2018-07-21 DIAGNOSIS — Z8249 Family history of ischemic heart disease and other diseases of the circulatory system: Secondary | ICD-10-CM | POA: Insufficient documentation

## 2018-07-21 DIAGNOSIS — S91302A Unspecified open wound, left foot, initial encounter: Secondary | ICD-10-CM | POA: Diagnosis not present

## 2018-07-21 DIAGNOSIS — M329 Systemic lupus erythematosus, unspecified: Secondary | ICD-10-CM | POA: Diagnosis not present

## 2018-07-21 DIAGNOSIS — E1142 Type 2 diabetes mellitus with diabetic polyneuropathy: Secondary | ICD-10-CM | POA: Diagnosis not present

## 2018-07-21 DIAGNOSIS — N186 End stage renal disease: Secondary | ICD-10-CM | POA: Insufficient documentation

## 2018-07-21 DIAGNOSIS — E1122 Type 2 diabetes mellitus with diabetic chronic kidney disease: Secondary | ICD-10-CM | POA: Diagnosis not present

## 2018-07-21 DIAGNOSIS — I872 Venous insufficiency (chronic) (peripheral): Secondary | ICD-10-CM | POA: Diagnosis not present

## 2018-07-21 DIAGNOSIS — M150 Primary generalized (osteo)arthritis: Secondary | ICD-10-CM | POA: Diagnosis not present

## 2018-07-21 DIAGNOSIS — I4891 Unspecified atrial fibrillation: Secondary | ICD-10-CM | POA: Diagnosis not present

## 2018-07-21 DIAGNOSIS — M793 Panniculitis, unspecified: Secondary | ICD-10-CM | POA: Insufficient documentation

## 2018-07-21 DIAGNOSIS — E11319 Type 2 diabetes mellitus with unspecified diabetic retinopathy without macular edema: Secondary | ICD-10-CM | POA: Diagnosis not present

## 2018-07-21 DIAGNOSIS — I998 Other disorder of circulatory system: Secondary | ICD-10-CM | POA: Diagnosis not present

## 2018-07-21 DIAGNOSIS — H409 Unspecified glaucoma: Secondary | ICD-10-CM | POA: Insufficient documentation

## 2018-07-21 DIAGNOSIS — J449 Chronic obstructive pulmonary disease, unspecified: Secondary | ICD-10-CM | POA: Diagnosis not present

## 2018-07-22 DIAGNOSIS — N2581 Secondary hyperparathyroidism of renal origin: Secondary | ICD-10-CM | POA: Diagnosis not present

## 2018-07-22 DIAGNOSIS — D689 Coagulation defect, unspecified: Secondary | ICD-10-CM | POA: Diagnosis not present

## 2018-07-22 DIAGNOSIS — E1129 Type 2 diabetes mellitus with other diabetic kidney complication: Secondary | ICD-10-CM | POA: Diagnosis not present

## 2018-07-22 DIAGNOSIS — E039 Hypothyroidism, unspecified: Secondary | ICD-10-CM | POA: Diagnosis not present

## 2018-07-22 DIAGNOSIS — E876 Hypokalemia: Secondary | ICD-10-CM | POA: Diagnosis not present

## 2018-07-22 DIAGNOSIS — D509 Iron deficiency anemia, unspecified: Secondary | ICD-10-CM | POA: Diagnosis not present

## 2018-07-22 DIAGNOSIS — D631 Anemia in chronic kidney disease: Secondary | ICD-10-CM | POA: Diagnosis not present

## 2018-07-22 DIAGNOSIS — R52 Pain, unspecified: Secondary | ICD-10-CM | POA: Diagnosis not present

## 2018-07-22 DIAGNOSIS — N186 End stage renal disease: Secondary | ICD-10-CM | POA: Diagnosis not present

## 2018-07-22 DIAGNOSIS — Z23 Encounter for immunization: Secondary | ICD-10-CM | POA: Diagnosis not present

## 2018-07-22 NOTE — Progress Notes (Signed)
Little, Dana ([id number]) Visit Report for 07/21/2018 Abuse/Suicide Risk Screen Details Patient Name: [name], [name]. Date of Service: 07/21/2018 12:30 PM Medical Record Number: [id number] Patient Account Number: [id number] Date of Birth/Sex: November 25, 1945 (72 y.o. F) Treating RN: Secundino Ginger Primary Care Auther Lyerly: Sharilyn Sites Other Clinician: Referring Obinna Ehresman: Sharilyn Sites Treating Kellyann Ordway/Extender: Tito Dine in Treatment: 0 Abuse/Suicide Risk Screen Items Answer ABUSE/SUICIDE RISK SCREEN: Has anyone close to you tried to hurt or harm you recentlyo No Do you feel uncomfortable with anyone in your familyo No Has anyone forced you do things that you didnot want to doo No Do you have any thoughts of harming yourselfo No Patient displays signs or symptoms of abuse and/or neglect. No Electronic Signature(s) Signed: 07/21/2018 4:05:05 PM By: Secundino Ginger Entered By: Secundino Ginger on 07/21/2018 13:03:46 [name] ([id number]) -------------------------------------------------------------------------------- Activities of Daily Living Details Patient Name: Dana, Little. Date of Service: 07/21/2018 12:30 PM Medical Record Number: [id number] Patient Account Number: [id number] Date of Birth/Sex: January 06, 1946 (72 y.o. F) Treating RN: Secundino Ginger Primary Care Riyana Biel: Sharilyn Sites Other Clinician: Referring Quorra Rosene: Sharilyn Sites Treating Curlie Sittner/Extender: Tito Dine in Treatment: 0 Activities of Daily Living Items Answer Activities of Daily Living (Please select one for each item) Drive Automobile Not Able Take Medications Completely Able Use Telephone Completely Able Care for Appearance Completely Able Use Toilet Need Assistance Bath / Shower Need Assistance Dress Self Need Assistance Feed Self Completely Able Walk Need Assistance Get In / Out Bed Need Assistance Housework Need Assistance Prepare Meals Need Assistance Handle  Money Need Assistance Shop for Self Need Assistance Electronic Signature(s) Signed: 07/21/2018 4:05:05 PM By: Secundino Ginger Entered By: Secundino Ginger on 07/21/2018 13:04:58 [name] ([id number]) -------------------------------------------------------------------------------- Education Assessment Details Patient Name: [name]. Date of Service: 07/21/2018 12:30 PM Medical Record Number: [id number] Patient Account Number: [id number] Date of Birth/Sex: Sep 11, 1946 (72 y.o. F) Treating RN: Secundino Ginger Primary Care Wash Nienhaus: Sharilyn Sites Other Clinician: Referring Zarria Towell: Sharilyn Sites Treating Brannon Levene/Extender: Tito Dine in Treatment: 0 Learning Preferences/Education Level/Primary Language Learning Preference: Explanation, Demonstration Highest Education Level: Grade School Preferred Language: English Cognitive Barrier Assessment/Beliefs Language Barrier: No Translator Needed: No Memory Deficit: No Emotional Barrier: No Cultural/Religious Beliefs Affecting Medical Care: No Physical Barrier Assessment Impaired Vision: No Impaired Hearing: No Decreased Hand dexterity: No Knowledge/Comprehension Assessment Knowledge Level: High Comprehension Level: High Ability to understand written High instructions: Ability to understand verbal High instructions: Motivation Assessment Anxiety Level: Calm Cooperation: Cooperative Education Importance: Acknowledges Need Interest in Health Problems: Asks Questions Perception: Coherent Willingness to Engage in Self- High Management Activities: Readiness to Engage in Self- High Management Activities: Electronic Signature(s) Signed: 07/21/2018 4:05:05 PM By: Secundino Ginger Entered By: Secundino Ginger on 07/21/2018 13:05:47 [name] ([id number]) -------------------------------------------------------------------------------- Fall Risk Assessment Details Patient Name: [name]. Date of Service:  07/21/2018 12:30 PM Medical Record Number: [id number] Patient Account Number: [id number] Date of Birth/Sex: 12/14/1945 (72 y.o. F) Treating RN: Secundino Ginger Primary Care Davena Julian: Sharilyn Sites Other Clinician: Referring Amdrew Oboyle: Sharilyn Sites Treating Charls Custer/Extender: Tito Dine in Treatment: 0 Fall Risk Assessment Items Have you had 2 or more falls in the last 12 monthso 0 No Have you had any fall that resulted in injury in the last 12 monthso 0 No FALL RISK ASSESSMENT: History of falling - immediate or within 3 months 0 No Secondary diagnosis 15 Yes Ambulatory aid None/bed rest/wheelchair/nurse 0 Yes Crutches/cane/walker 0 No Furniture 0 No IV Access/Saline Lock  0 No Gait/Training Normal/bed rest/immobile 0 No Weak 0 No Impaired 0 No Mental Status Oriented to own ability 0 Yes Electronic Signature(s) Signed: 07/21/2018 4:05:05 PM By: Secundino Ginger Entered By: Secundino Ginger on 07/21/2018 13:06:50 [name] ([id number]) -------------------------------------------------------------------------------- Foot Assessment Details Patient Name: [name]. Date of Service: 07/21/2018 12:30 PM Medical Record Number: [id number] Patient Account Number: [id number] Date of Birth/Sex: 1946/02/24 (72 y.o. F) Treating RN: Secundino Ginger Primary Care Tiberius Loftus: Sharilyn Sites Other Clinician: Referring Natilie Krabbenhoft: Sharilyn Sites Treating Lysette Lindenbaum/Extender: Tito Dine in Treatment: 0 Foot Assessment Items [x]  Unable to perform right foot assessment due to amputation Site Locations + = Sensation present, - = Sensation absent, C = Callus, U = Ulcer R = Redness, W = Warmth, M = Maceration, PU = Pre-ulcerative lesion F = Fissure, S = Swelling, D = Dryness Assessment Right: Left: Other Deformity: No Prior Foot Ulcer: No Prior Amputation: No Charcot Joint: No Ambulatory Status: Gait: Notes pt. denies any feeling to left foot. Electronic Signature(s) Signed:  07/21/2018 4:05:05 PM By: Secundino Ginger Entered By: Secundino Ginger on 07/21/2018 13:09:03 [name] ([id number]) -------------------------------------------------------------------------------- Nutrition Risk Assessment Details Patient Name: [name]. Date of Service: 07/21/2018 12:30 PM Medical Record Number: [id number] Patient Account Number: [id number] Date of Birth/Sex: [birth year] (72 y.o. F) Treating RN: Secundino Ginger Primary Care Kyrie Fludd: Sharilyn Sites Other Clinician: Referring Naysha Sholl: Sharilyn Sites Treating Quantae Martel/Extender: Tito Dine in Treatment: 0 Height (in): Weight (lbs): Body Mass Index (BMI): Nutrition Risk Assessment Items NUTRITION RISK SCREEN: I have an illness or condition that made me change the kind and/or amount of 0 No food I eat I eat fewer than two meals per day 0 No I eat few fruits and vegetables, or milk products 0 No I have three or more drinks of beer, liquor or wine almost every day 0 No I have tooth or mouth problems that make it hard for me to eat 0 No I don't always have enough money to buy the food I need 0 No I eat alone most of the time 0 No I take three or more different prescribed or over-the-counter drugs a day 0 No Without wanting to, I have lost or gained 10 pounds in the last six months 0 No I am not always physically able to shop, cook and/or feed myself 0 No Nutrition Protocols Good Risk Protocol Moderate Risk Protocol Electronic Signature(s) Signed: 07/21/2018 4:05:05 PM By: Secundino Ginger Entered BySecundino Ginger on 07/21/2018 13:07:05

## 2018-07-23 ENCOUNTER — Telehealth: Payer: Self-pay | Admitting: *Deleted

## 2018-07-23 DIAGNOSIS — Z86718 Personal history of other venous thrombosis and embolism: Secondary | ICD-10-CM | POA: Diagnosis not present

## 2018-07-23 DIAGNOSIS — D631 Anemia in chronic kidney disease: Secondary | ICD-10-CM | POA: Diagnosis not present

## 2018-07-23 DIAGNOSIS — E1151 Type 2 diabetes mellitus with diabetic peripheral angiopathy without gangrene: Secondary | ICD-10-CM | POA: Diagnosis not present

## 2018-07-23 DIAGNOSIS — M329 Systemic lupus erythematosus, unspecified: Secondary | ICD-10-CM | POA: Diagnosis not present

## 2018-07-23 DIAGNOSIS — M1990 Unspecified osteoarthritis, unspecified site: Secondary | ICD-10-CM | POA: Diagnosis not present

## 2018-07-23 DIAGNOSIS — Z89611 Acquired absence of right leg above knee: Secondary | ICD-10-CM | POA: Diagnosis not present

## 2018-07-23 DIAGNOSIS — Z87891 Personal history of nicotine dependence: Secondary | ICD-10-CM | POA: Diagnosis not present

## 2018-07-23 DIAGNOSIS — H409 Unspecified glaucoma: Secondary | ICD-10-CM | POA: Diagnosis not present

## 2018-07-23 DIAGNOSIS — M150 Primary generalized (osteo)arthritis: Secondary | ICD-10-CM | POA: Diagnosis not present

## 2018-07-23 DIAGNOSIS — I872 Venous insufficiency (chronic) (peripheral): Secondary | ICD-10-CM | POA: Diagnosis not present

## 2018-07-23 DIAGNOSIS — Z794 Long term (current) use of insulin: Secondary | ICD-10-CM | POA: Diagnosis not present

## 2018-07-23 DIAGNOSIS — M109 Gout, unspecified: Secondary | ICD-10-CM | POA: Diagnosis not present

## 2018-07-23 DIAGNOSIS — E1122 Type 2 diabetes mellitus with diabetic chronic kidney disease: Secondary | ICD-10-CM | POA: Diagnosis not present

## 2018-07-23 DIAGNOSIS — I739 Peripheral vascular disease, unspecified: Secondary | ICD-10-CM | POA: Diagnosis not present

## 2018-07-23 DIAGNOSIS — E11319 Type 2 diabetes mellitus with unspecified diabetic retinopathy without macular edema: Secondary | ICD-10-CM | POA: Diagnosis not present

## 2018-07-23 DIAGNOSIS — S78111A Complete traumatic amputation at level between right hip and knee, initial encounter: Secondary | ICD-10-CM | POA: Diagnosis not present

## 2018-07-23 DIAGNOSIS — J449 Chronic obstructive pulmonary disease, unspecified: Secondary | ICD-10-CM | POA: Diagnosis not present

## 2018-07-23 DIAGNOSIS — N186 End stage renal disease: Secondary | ICD-10-CM | POA: Diagnosis not present

## 2018-07-23 DIAGNOSIS — Z992 Dependence on renal dialysis: Secondary | ICD-10-CM | POA: Diagnosis not present

## 2018-07-23 DIAGNOSIS — Z4781 Encounter for orthopedic aftercare following surgical amputation: Secondary | ICD-10-CM | POA: Diagnosis not present

## 2018-07-23 DIAGNOSIS — I998 Other disorder of circulatory system: Secondary | ICD-10-CM | POA: Diagnosis not present

## 2018-07-23 DIAGNOSIS — I129 Hypertensive chronic kidney disease with stage 1 through stage 4 chronic kidney disease, or unspecified chronic kidney disease: Secondary | ICD-10-CM | POA: Diagnosis not present

## 2018-07-23 DIAGNOSIS — E785 Hyperlipidemia, unspecified: Secondary | ICD-10-CM | POA: Diagnosis not present

## 2018-07-23 DIAGNOSIS — I4891 Unspecified atrial fibrillation: Secondary | ICD-10-CM | POA: Diagnosis not present

## 2018-07-23 DIAGNOSIS — E1142 Type 2 diabetes mellitus with diabetic polyneuropathy: Secondary | ICD-10-CM | POA: Diagnosis not present

## 2018-07-23 NOTE — Telephone Encounter (Signed)
[name] from [facility] in [place] is requesting a call back from Dr Posey Pronto about [name].  Her number is  [phone]

## 2018-07-23 NOTE — Progress Notes (Signed)
ELESA, [name] ([id number]) Visit Report for 07/21/2018 Allergy List Details Patient Name: [name], [name]. Date of Service: 07/21/2018 12:30 PM Medical Record Number: [id number] Patient Account Number: [id number] Date of Birth/Sex: May 31, 1946 (72 y.o. F) Treating RN: Secundino Ginger Primary Care Stefannie Defeo: Sharilyn Sites Other Clinician: Referring Mateya Torti: Sharilyn Sites Treating Averill Winters/Extender: Ricard Dillon Weeks in Treatment: 0 Allergies Active Allergies ACE Inhibitors Allergy Notes Electronic Signature(s) Signed: 07/21/2018 4:05:05 PM By: Secundino Ginger Entered By: Secundino Ginger on 07/21/2018 12:58:09 [name] ([id number]) -------------------------------------------------------------------------------- Arrival Information Details Patient Name: [name]. Date of Service: 07/21/2018 12:30 PM Medical Record Number: [id number] Patient Account Number: [id number] Date of Birth/Sex: [birth year] (72 y.o. F) Treating RN: Secundino Ginger Primary Care Ras Kollman: Sharilyn Sites Other Clinician: Referring Shammond Arave: Sharilyn Sites Treating Desaree Downen/Extender: Tito Dine in Treatment: 0 Visit Information Patient Arrived: Wheel Chair Arrival Time: 12:39 Accompanied By: husband Transfer Assistance: EasyPivot Patient Lift Patient Identification Verified: Yes Secondary Verification Process Yes Completed: History Since Last Visit Added or deleted any medications: No Any new allergies or adverse reactions: No Had Little fall or experienced change in activities of daily living that may affect risk of falls: No Signs or symptoms of abuse/neglect since last visito No Hospitalized since last visit: No Implantable device outside of the clinic excluding cellular tissue based products placed in the center since last visit: No Has Dressing in Place as Prescribed: Yes Pain Present Now: Yes Electronic Signature(s) Signed: 07/21/2018 4:05:05 PM By: Secundino Ginger Entered By: Secundino Ginger on 07/21/2018 12:50:53 [name] ([id number]) -------------------------------------------------------------------------------- Clinic Level of Care Assessment Details Patient Name: [name]. Date of Service: 07/21/2018 12:30 PM Medical Record Number: [id number] Patient Account Number: [id number] Date of Birth/Sex: [birth year] (72 y.o. F) Treating RN: Cornell Barman Primary Care Yader Criger: Sharilyn Sites Other Clinician: Referring Marco Raper: Sharilyn Sites Treating Ariauna Farabee/Extender: Tito Dine in Treatment: 0 Clinic Level of Care Assessment Items TOOL 2 Quantity Score []  - Use when only an EandM is performed on the INITIAL visit 0 ASSESSMENTS - Nursing Assessment / Reassessment X - General Physical Exam (combine w/ comprehensive assessment (listed just below) when 1 20 performed on new pt. evals) X- 1 25 Comprehensive Assessment (HX, ROS, Risk Assessments, Wounds Hx, etc.) ASSESSMENTS - Wound and Skin Assessment / Reassessment X - Simple Wound Assessment / Reassessment - one wound 1 5 []  - 0 Complex Wound Assessment / Reassessment - multiple wounds []  - 0 Dermatologic / Skin Assessment (not related to wound area) ASSESSMENTS - Ostomy and/or Continence Assessment and Care []  - Incontinence Assessment and Management 0 []  - 0 Ostomy Care Assessment and Management (repouching, etc.) PROCESS - Coordination of Care X - Simple Patient / Family Education for ongoing care 1 15 []  - 0 Complex (extensive) Patient / Family Education for ongoing care []  - 0 Staff obtains Programmer, systems, Records, Test Results / Process Orders []  - 0 Staff telephones HHA, Nursing Homes / Clarify orders / etc []  - 0 Routine Transfer to another Facility (non-emergent condition) []  - 0 Routine Hospital Admission (non-emergent condition) []  - 0 New Admissions / Biomedical engineer / Ordering NPWT, Apligraf, etc. []  - 0 Emergency Hospital Admission (emergent condition) X- 1  10 Simple Discharge Coordination []  - 0 Complex (extensive) Discharge Coordination PROCESS - Special Needs []  - Pediatric / Minor Patient Management 0 []  - 0 Isolation Patient Management [name], Dana F. ([id number]) []  - 0 Hearing / Language / Visual special needs []  - 0 Assessment of Community  assistance (transportation, D/C planning, etc.) []  - 0 Additional assistance / Altered mentation []  - 0 Support Surface(s) Assessment (bed, cushion, seat, etc.) INTERVENTIONS - Wound Cleansing / Measurement X - Wound Imaging (photographs - any number of wounds) 1 5 []  - 0 Wound Tracing (instead of photographs) X- 1 5 Simple Wound Measurement - one wound []  - 0 Complex Wound Measurement - multiple wounds X- 1 5 Simple Wound Cleansing - one wound []  - 0 Complex Wound Cleansing - multiple wounds INTERVENTIONS - Wound Dressings []  - Small Wound Dressing one or multiple wounds 0 X- 1 15 Medium Wound Dressing one or multiple wounds []  - 0 Large Wound Dressing one or multiple wounds []  - 0 Application of Medications - injection INTERVENTIONS - Miscellaneous []  - External ear exam 0 []  - 0 Specimen Collection (cultures, biopsies, blood, body fluids, etc.) []  - 0 Specimen(s) / Culture(s) sent or taken to Lab for analysis []  - 0 Patient Transfer (multiple staff / Civil Service fast streamer / Similar devices) []  - 0 Simple Staple / Suture removal (25 or less) []  - 0 Complex Staple / Suture removal (26 or more) []  - 0 Hypo / Hyperglycemic Management (close monitor of Blood Glucose) []  - 0 Ankle / Brachial Index (ABI) - do not check if billed separately Has the patient been seen at the hospital within the last three years: Yes Total Score: 105 Level Of Care: New/Established - Level 3 Electronic Signature(s) Signed: 07/21/2018 4:45:51 PM By: Gretta Cool, BSN, RN, CWS, Kim RN, BSN Entered By: Gretta Cool, BSN, RN, CWS, Kim on 07/21/2018 16:15:34 [name]  ([id number]) -------------------------------------------------------------------------------- Encounter Discharge Information Details Patient Name: [name]. Date of Service: 07/21/2018 12:30 PM Medical Record Number: [id number] Patient Account Number: [id number] Date of Birth/Sex: 07-Jun-1946 (72 y.o. F) Treating RN: Cornell Barman Primary Care Brek Reece: Sharilyn Sites Other Clinician: Referring Nikeia Henkes: Sharilyn Sites Treating Kennen Stammer/Extender: Tito Dine in Treatment: 0 Encounter Discharge Information Items Discharge Condition: Stable Ambulatory Status: Wheelchair Discharge Destination: Home Transportation: Private Auto Accompanied By: husband Schedule Follow-up Appointment: Yes Clinical Summary of Care: Electronic Signature(s) Signed: 07/21/2018 4:33:03 PM By: Gretta Cool, BSN, RN, CWS, Kim RN, BSN Entered By: Gretta Cool, BSN, RN, CWS, Kim on 07/21/2018 16:33:03 [name] ([id number]) -------------------------------------------------------------------------------- Lower Extremity Assessment Details Patient Name: [name], [name]. Date of Service: 07/21/2018 12:30 PM Medical Record Number: [id number] Patient Account Number: [id number] Date of Birth/Sex: September 03, 1946 (72 y.o. F) Treating RN: Secundino Ginger Primary Care Jenavive Lamboy: Sharilyn Sites Other Clinician: Referring Rhia Blatchford: Sharilyn Sites Treating Vaniyah Lansky/Extender: Tito Dine in Treatment: 0 Electronic Signature(s) Signed: 07/21/2018 4:05:05 PM By: Secundino Ginger Entered By: Secundino Ginger on 07/21/2018 13:10:55 [name] ([id number]) -------------------------------------------------------------------------------- Multi Wound Chart Details Patient Name: [name]. Date of Service: 07/21/2018 12:30 PM Medical Record Number: [id number] Patient Account Number: [id number] Date of Birth/Sex: 1946-03-25 (72 y.o. F) Treating RN: Cornell Barman Primary Care Rayhaan Huster: Sharilyn Sites Other  Clinician: Referring Max Nuno: Sharilyn Sites Treating Taron Mondor/Extender: Tito Dine in Treatment: 0 Vital Signs Height(in): Pulse(bpm): 39 Weight(lbs): Blood Pressure(mmHg): 127/53 Body Mass Index(BMI): Temperature(F): 98.0 Respiratory Rate 16 (breaths/min): Photos: [10:No Photos] [N/Little:N/Little] Wound Location: [10:Left Foot - Distal] [N/Little:N/Little] Wounding Event: [10:Gradually Appeared] [N/Little:N/Little] Primary Etiology: [10:Arterial Insufficiency Ulcer N/Little] Comorbid History: [10:Cataracts, Glaucoma, Anemia, N/Little Chronic Obstructive Pulmonary Disease (COPD), Arrhythmia, Hypertension, Type II Diabetes, End Stage Renal Disease, Gout, Rheumatoid Arthritis] Date Acquired: [10:04/29/2018] [N/Little:N/Little] Weeks of Treatment: [10:0] [N/Little:N/Little] Wound Status: [10:Open] [N/Little:N/Little] Measurements L x W x D [10:8x21.5x0.1] [N/Little:N/Little] (  cm) Area (cm) : [10:135.088] [N/Little:N/Little] Volume (cm) : [10:13.509] [N/Little:N/Little] % Reduction in Area: [10:0.00%] [N/Little:N/Little] % Reduction in Volume: [10:0.00%] [N/Little:N/Little] Classification: [10:Unclassifiable] [N/Little:N/Little] Exudate Amount: [10:None Present] [N/Little:N/Little] Granulation Amount: [10:None Present (0%)] [N/Little:N/Little] Necrotic Amount: [10:Large (67-100%)] [N/Little:N/Little] Necrotic Tissue: [10:Eschar] [N/Little:N/Little] Exposed Structures: [10:Fascia: No Fat Layer (Subcutaneous Tissue) Exposed: No Tendon: No Muscle: No Joint: No Bone: No] [N/Little:N/Little] Periwound Skin Texture: [10:Excoriation: No Induration: No Callus: No Crepitus: No] [N/Little:N/Little] Rash: No Scarring: No Periwound Skin Moisture: Maceration: No N/Little N/Little Dry/Scaly: No Periwound Skin Color: Atrophie Blanche: No N/Little N/Little Cyanosis: No Ecchymosis: No Erythema: No Hemosiderin Staining: No Mottled: No Pallor: No Rubor: No Tenderness on Palpation: No N/Little N/Little Wound Preparation: Ulcer Cleansing: N/Little N/Little Rinsed/Irrigated with Saline Treatment Notes Electronic Signature(s) Signed: 07/21/2018 4:45:51 PM By: Gretta Cool, BSN, RN, CWS, Kim RN,  BSN Entered By: Gretta Cool, BSN, RN, CWS, Kim on 07/21/2018 13:26:41 [name] ([id number]) -------------------------------------------------------------------------------- [place] Details Patient Name: [name], [name]. Date of Service: 07/21/2018 12:30 PM Medical Record Number: [id number] Patient Account Number: [id number] Date of Birth/Sex: Feb 20, 1946 (72 y.o. F) Treating RN: Secundino Ginger Primary Care Quame Spratlin: Sharilyn Sites Other Clinician: Referring Kaio Kuhlman: Sharilyn Sites Treating Gazella Anglin/Extender: Tito Dine in Treatment: 0 Active Inactive Electronic Signature(s) Signed: 07/21/2018 4:05:05 PM By: Secundino Ginger Signed: 07/21/2018 4:45:51 PM By: Gretta Cool, BSN, RN, CWS, Kim RN, BSN Entered By: Gretta Cool, BSN, RN, CWS, Kim on 07/21/2018 13:26:15 [name] ([id number]) -------------------------------------------------------------------------------- Pain Assessment Details Patient Name: [name], [name]. Date of Service: 07/21/2018 12:30 PM Medical Record Number: [id number] Patient Account Number: [id number] Date of Birth/Sex: 1946-01-21 (72 y.o. F) Treating RN: Secundino Ginger Primary Care Kayo Zion: Sharilyn Sites Other Clinician: Referring Markcus Lazenby: Sharilyn Sites Treating Doniqua Saxby/Extender: Tito Dine in Treatment: 0 Active Problems Location of Pain Severity and Description of Pain Patient Has Paino Yes Site Locations Pain Location: Pain in Ulcers Duration of the Pain. Constant / Intermittento Constant Rate the pain. Current Pain Level: 8 Worst Pain Level: 10 Least Pain Level: 7 Tolerable Pain Level: 7 Character of Pain Describe the Pain: Aching, Splitting Pain Management and Medication Current Pain Management: Goals for Pain Management pt C/O constant pain to left foot and toes. currently on fentanyl patch. enc. to see primary as needed for pain managment. Electronic Signature(s) Signed: 07/21/2018 4:05:05 PM By:  Secundino Ginger Entered By: Secundino Ginger on 07/21/2018 12:54:35 [name] ([id number]) -------------------------------------------------------------------------------- Wound Assessment Details Patient Name: [name]. Date of Service: 07/21/2018 12:30 PM Medical Record Number: [id number] Patient Account Number: [id number] Date of Birth/Sex: 1946/05/03 (72 y.o. F) Treating RN: Secundino Ginger Primary Care Jamica Woodyard: Sharilyn Sites Other Clinician: Referring Seiya Silsby: Sharilyn Sites Treating Ted Goodner/Extender: Tito Dine in Treatment: 0 Wound Status Wound Number: 10 Primary Arterial Insufficiency Ulcer Etiology: Wound Location: Left Foot - Distal Wound Open Wounding Event: Gradually Appeared Status: Date Acquired: 04/29/2018 Comorbid Cataracts, Glaucoma, Anemia, Chronic Weeks Of Treatment: 0 History: Obstructive Pulmonary Disease (COPD), Clustered Wound: No Arrhythmia, Hypertension, Type II Diabetes, End Stage Renal Disease, Gout, Rheumatoid Arthritis Photos Wound Measurements Length: (cm) 8 % Reducti Width: (cm) 21.5 % Reducti Depth: (cm) 0.1 Tunneling Area: (cm) 135.088 Undermin Volume: (cm) 13.509 on in Area: 0% on in Volume: 0% : No ing: No Wound Description Classification: Unclassifiable Foul Odo Exudate Amount: None Present Slough/F r After Cleansing: No ibrino No Wound Bed Granulation Amount: None Present (0%) Exposed Structure Necrotic Amount: Large (67-100%) Fascia Exposed: No Necrotic Quality: Eschar Fat Layer (Subcutaneous Tissue)  Exposed: No Tendon Exposed: No Muscle Exposed: No Joint Exposed: No Bone Exposed: No Periwound Skin Texture Texture Color No Abnormalities Noted: No No Abnormalities Noted: No [name], RAMA. ([id number]) Callus: No Atrophie Blanche: No Crepitus: No Cyanosis: No Excoriation: No Ecchymosis: No Induration: No Erythema: No Rash: No Hemosiderin Staining: No Scarring: No Mottled: No Pallor:  No Moisture Rubor: No No Abnormalities Noted: No Dry / Scaly: No Maceration: No Wound Preparation Ulcer Cleansing: Rinsed/Irrigated with Saline Electronic Signature(s) Signed: 07/21/2018 4:05:05 PM By: Secundino Ginger Entered By: Secundino Ginger on 07/21/2018 16:02:20 [name] ([id number]) -------------------------------------------------------------------------------- Vitals Details Patient Name: [name]. Date of Service: 07/21/2018 12:30 PM Medical Record Number: [id number] Patient Account Number: [id number] Date of Birth/Sex: 07/30/1946 (72 y.o. F) Treating RN: Secundino Ginger Primary Care [name] [name]: Sharilyn Sites Other Clinician: Referring Izzabell Klasen: Sharilyn Sites Treating Clovia Reine/Extender: Tito Dine in Treatment: 0 Vital Signs Time Taken: 12:54 Temperature (F): 98.0 Pulse (bpm): 83 Respiratory Rate (breaths/min): 16 Blood Pressure (mmHg): 127/53 Reference Range: 80 - 120 mg / dl Electronic Signature(s) Signed: 07/21/2018 4:05:05 PM By: Secundino Ginger Entered By: Secundino Ginger on 07/21/2018 12:55:59

## 2018-07-23 NOTE — Telephone Encounter (Signed)
Attempted to call during office hours, however, message stating office is closed. Left call back number. Thanks.

## 2018-07-23 NOTE — Progress Notes (Signed)
BRYTANI, VOTH ([id number]) Visit Report for 07/21/2018 Chief Complaint Document Details Patient Name: [name], [name]. Date of Service: 07/21/2018 12:30 PM Medical Record Number: [id number] Patient Account Number: [id number] Date of Birth/Sex: [birth year] (72 y.o. F) Treating RN: Cornell Barman Primary Care Provider: Sharilyn Sites Other Clinician: Referring Provider: Sharilyn Sites Treating Provider/Extender: Tito Dine in Treatment: 0 Information Obtained from: Patient Chief Complaint 03/10/18; patient is here for review of wounds predominantly on her right calf with substantial full-thickness skin loss. 07/21/18; patient returns for review of dry gangrene in the left foot. She has had a right AKA Electronic Signature(s) Signed: 07/21/2018 4:09:45 PM By: Linton Ham MD Entered By: Linton Ham on 07/21/2018 13:40:53 [name] ([id number]) -------------------------------------------------------------------------------- HPI Details Patient Name: [name]. Date of Service: 07/21/2018 12:30 PM Medical Record Number: [id number] Patient Account Number: [id number] Date of Birth/Sex: July 16, 1946 (72 y.o. F) Treating RN: Cornell Barman Primary Care Provider: Sharilyn Sites Other Clinician: Referring Provider: Sharilyn Sites Treating Provider/Extender: Tito Dine in Treatment: 0 History of Present Illness HPI Description: ADMISSION 03/10/18; This is a 72 year old woman with a history of end-stage renal disease on hemodialysis, type 2 diabetes on insulin with excellent control, systemic lupus. Her problem began in October 2018 she developed a blister in an open wound on the right anterior tibial mid tibial area. This progressed and worsened until the tissue loss was substantial on the anterior and posterior right calf. she was followed in [place] by physical therapy at one point although I have not reviewed their notes. She apparently  did not receive pulse lavage to the wound. I note that she was hospitalized from 03/01/18 through 03/03/18 predominantly for debridement of the wound by general surgery. She saw Dr. Arnoldo Morale who did not believe there was a role for surgical debridement. Was felt that definitive treatment would be amputation but the patient already been offered this by vein and vascular in [facility] and she refused arrangements were made for her to be sent to this clinic for review. The patient is already had a reasonably substantial review for this problem. she saw vein and vascular extensively over the course of this year.her noninvasive arterial studies done in December 2018 suggested a right superficial femoral artery occlusion/stenosis. She did not have any issues on the left. At that point her ABI on the right was 0.8 with monophasic waveforms 1.06 on the left. However she had a velocity drop off in the superficial femoral artery. She went on to have diagnostic angiogram on 12/18/17 by Dr. Oneida Alar. This showed on the right that the common femoral, profunda femoris and superficial femoral arteries were all patent. There was a focal less than 1 mm length stenosis on the right superficial femoral artery otherwise the artery was widely patent. The right popliteal artery was widely patent. There was 3 vessel runoff via the anterior, tibial and paranoid Milta Deiters arteries to the right foot. She was not felt to have a vascular cause of these wounds. 04/28/18; really no progress at all. Fact the area of the right anterior leg is worse more necrotic tissue. Also more exposed tendon on the right posterior leg and I think the left leg is developing same thing. Her calf looks increasingly pale and almost mottled looking. She has had breakdown on her toes. The patient is seen her rheumatologist that [facility]. From this point of view I would wonder about antiphospholipid syndrome oShe is also seeing Dr. Shanon Rosser  of vascular in [place].  By discussion with her husband I don't think Dr. Doren Custard felt that anything further was to be done even given the breakdown on the left leg. Also if I can review his notes to that effect oShe is seeing Dr. Lillia Carmel of [facility] dermatology this afternoon I am hopeful that their pathologist will give [place] a diagnosis. I am increasingly worried that we are on her way to a similar situation in the left sHe's also had 2 separate biopsies. Firstly on 3/25 showed nonspecific findings with calciphylaxis not entirely excluded. No definite calcium deposits were seen within the dermis within the sections reviewed. Vascular features were nonspecific. Furthermore pyogenic granuloma may demonstrate similar findings. 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 Calciphylaxis was not favored in this specimen. There was a calcified vessel. I don't see a pathology specimen that specifically says definitive calciphylaxis. The patient had an x-ray of her right tibia while she is in hospital there was no osteomyelitis. She had an MRI of her foot in March that did not show osteomyelitis as well The patient has been using Betadine and wraps to the wounds. This is being done by a home health where they live in [place] 03/17/18; patient admitted to clinic last week. 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 Considerable amount of necrotic surface material that I removed from the posterior calf last MARESA, MORASH F. ([id number]) week and again this week. The area anteriorly is far too adherent at this point. She lives in [place]. She has advanced home care home health. Dialyzes in [place] as well. 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 There is  very apparent necrotic material in the anterior side. We've been using Santyl anteriorly and silver alginate posteriorly. 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 I've gotten her an appointment at [facility] dermatology next week hopefully to have the original biopsy sites reviewed. The patient is still in a lot of pain. She is not straightening the leg out. She had an angiogram that did not suggest coexistent macrovascular disease 04/07/18; absolutely no improvement in this lady's condition. Extensive wounds on the right anterior covered in necrotic surface and the right posterior calf. There is better looking tissue posteriorly on the right over there is a large exposed tendon. In addition today she seems to develop some wounds on her toes on the left foot which were new this week especially the left third toe most circumferential loss. This unfortunate woman is in a lot of pain and I once again I've talked to her about an amputation of the right leg which she doesn't want to consider. 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 She has a large necrotic covered wound on the right anterior tibia oLarge full-thickness wound on the right posterior tibia with healthier looking tissue but a large exposed piece of tendon oNecrotic wound on the right lateral heel oThe new problem this week is wounds on her toes 04/14/18; this patient's overall condition in the right leg is unchanged she has 3 open areas of large necrotic area anteriorly. Better-looking surface but exposed tendon in the large wound posteriorly and an area on her right lateral calcaneus oShe has new wounds this week on the left first second and third toes Although these all look vascular  previous arteriogram on the right did not show an obvious macrovascular etiology. This does not rule out a Small vesselvasculopathy. I have her being followed by Dr. Lillia Carmel at [facility] dermatology to see  if they can review the slides already done to see if we can establish a specific etiology i.e. calciphylaxis, antiphospholipid syndrome [patient has SLE]; or some other cause of this. I have not wanted to attempt to rebiopsy this in our clinic leaving that to the academic dermatologist's. I note she is now on Levaquin I can see in [place] that the dermatologist culture something that was Pseudomonas. I'm not sure what they cultured. 05/19/18; is more than a month since I last saw this patient. She is not doing well. I have notes from [facility] dermatology a careful review by Dr. Hazle Quant. Apparently additional biopsies were done that did not suggest vasculitis or vasculopathy. In addition don't believe that there is any evidence of calcification suggesting calciphylaxis. She did culture MSSA and was given a course of Keflex which she is finished. They referred her to [facility] hematology to consider a workup for antiphospholipid syndrome. The patient has not done well. She is developed increasing areas of necrosis in the right anterior leg both superiorly and inferiorly. All of her toes on the right foot are ischemic as are on the left foot. She saw Dr. Doren Custard last month to noted her previously normal arteriogram I believe in April. I was really shocked a day to see how much worse this was. Patient rates her pain on the right leg at 8 out of 10 828/19; the patient's right leg continues to deteriorate with increasing necrotic material. This extends almost to her knee and into her forefoot. Her toes are mummified. She is in a lot of pain. Her primary physician put her on fentanyl patch 73 og per hour according to her husband which is really a large dose even though she is not opiate naove. She also has oxycodone when necessary. In any case I think she is agreed to consider an amputation. She has an appointment on September 6 with Dr. Doren Custard of [facility] I'm going to text him about this and  also the mummification of her left toes READMISSION 07/21/18 Mrs. Dunton returns to clinic today for review of her left foot. She has undergone a right AKA and this is healed well. This was done by Dr. Doren Custard of vein and vascular in [place].Pathology of her amputation showed leg with ulceration and associated necrotizing inflammation. Calcific atherosclerosis of the medium-sized arteries without focal obstruction. During the last 2 visits she had in our clinic was noteworthy that her left toes showed gangrenous changes/dry gangrene. Since she was last here she has been followed by Dr. Wanita Chamberlain [facility] ..hematology in [facility] She is thought to have antiphospholipid syndrome based on a positive lupus anticoagulant and a clinical .presentation of arterial thrombosis. As a understand her hematologist last note the panel was done on Eliquis. Her liquids was held for a few days and then the panel repeated. I'm not exactly sure I am looking at the final result of this however. The patient has dry gangrene of all of her toes with ischemic changes extending into her forefoot. She rates her pain as 8 out of 10 and states she cannot sleep. She is on fentanyl 25 og an hour and when necessary Percocet 5/325. She states that LOYAL, RUDY. ([id number]) somebody is trying to taper her Percocet, short of a side effect  I'm not really aware of she certainly isn't enough pain to justify continuing potent narcotics. They tell me she has a follow-up with Dr. Doren Custard in November at which time she is supposed to have noninvasive studies. Electronic Signature(s) Signed: 07/21/2018 4:09:45 PM By: Linton Ham MD Entered By: Linton Ham on 07/21/2018 13:59:24 [name] ([id number]) -------------------------------------------------------------------------------- Physical Exam Details Patient Name: VERDIA, BOLT. Date of Service: 07/21/2018 12:30 PM Medical Record  Number: [id number] Patient Account Number: [id number] Date of Birth/Sex: 12-22-1945 (72 y.o. F) Treating RN: Cornell Barman Primary Care Provider: Sharilyn Sites Other Clinician: Referring Provider: Sharilyn Sites Treating Provider/Extender: Tito Dine in Treatment: 0 Constitutional Sitting or standing Blood Pressure is within target range for patient.. Pulse regular and within target range for patient.Marland Kitchen Respirations regular, non-labored and within target range.. Temperature is normal and within the target range for the patient.Marland Kitchen appears in no distress. Eyes Conjunctivae clear. No discharge. Respiratory Respiratory effort is easy and symmetric bilaterally. Rate is normal at rest and on room air.. Cardiovascular Pedal pulses are absent on the left. I cannot feel a popliteal pulse either. Her femoral pulses palpable. He has a dialysis catheter in the right subclavian. Gastrointestinal (GI) No liver or spleen enlargement or tenderness.. Lymphatic None palpable in the popliteal or inguinal area. Musculoskeletal Right AK stump is well-healed. Integumentary (Hair, Skin) Within the ischemic dry gangrene in the left forefoot including all of her toes no other skin issues are seen. Notes Wound exam; the patient really has no open wounds. She has dry gangrene of all of her toes with mummification. She also has spreading ischemic change up until her metatarsal heads. The rest of her foot still looks viable although it also looks ischemic. She has no open wounds noted drainage no evidence of infection Electronic Signature(s) Signed: 07/21/2018 4:09:45 PM By: Linton Ham MD Entered By: Linton Ham on 07/21/2018 14:01:39 [name] ([id number]) -------------------------------------------------------------------------------- Physician Orders Details Patient Name: [name]. Date of Service: 07/21/2018 12:30 PM Medical Record Number: [id number] Patient Account  Number: [id number] Date of Birth/Sex: [birth year] (72 y.o. F) Treating RN: Cornell Barman Primary Care Provider: Sharilyn Sites Other Clinician: Referring Provider: Sharilyn Sites Treating Provider/Extender: Tito Dine in Treatment: 0 Verbal / Phone Orders: No Diagnosis Coding ICD-10 Coding Code Description D68.61 Antiphospholipid syndrome E11.621 Type 2 diabetes mellitus with foot ulcer E11.52 Type 2 diabetes mellitus with diabetic peripheral angiopathy with gangrene E11.22 Type 2 diabetes mellitus with diabetic chronic Little disease Discharge From [facility] Wound #10 Left,Distal Foot o Discharge from [place] - Patient to follow-up with Dr. Doren Custard. Will call wound care center in the future if needed. Electronic Signature(s) Signed: 07/21/2018 4:09:45 PM By: Linton Ham MD Signed: 07/21/2018 4:45:51 PM By: Gretta Cool, BSN, RN, CWS, Kim RN, BSN Entered By: Gretta Cool, BSN, RN, CWS, Kim on 07/21/2018 13:45:10 [name] ([id number]) -------------------------------------------------------------------------------- Problem List Details Patient Name: NELSIE, DOMINO. Date of Service: 07/21/2018 12:30 PM Medical Record Number: [id number] Patient Account Number: [id number] Date of Birth/Sex: 1946/09/23 (72 y.o. F) Treating RN: Cornell Barman Primary Care Provider: Sharilyn Sites Other Clinician: Referring Provider: Sharilyn Sites Treating Provider/Extender: Tito Dine in Treatment: 0 Active Problems ICD-10 Evaluated Encounter Code Description Active Date Today Diagnosis D68.61 Antiphospholipid syndrome 07/21/2018 No Yes E11.621 Type 2 diabetes mellitus with foot ulcer 07/21/2018 No Yes E11.52 Type 2 diabetes mellitus with diabetic peripheral angiopathy 07/21/2018 No Yes with gangrene E11.22 Type 2 diabetes mellitus with diabetic chronic Little disease  07/21/2018 No Yes Inactive Problems Resolved Problems Electronic Signature(s) Signed:  07/21/2018 4:09:45 PM By: Linton Ham MD Entered By: Linton Ham on 07/21/2018 13:14:03 [name] ([id number]) -------------------------------------------------------------------------------- Progress Note Details Patient Name: [name]. Date of Service: 07/21/2018 12:30 PM Medical Record Number: [id number] Patient Account Number: [id number] Date of Birth/Sex: [birth year] (72 y.o. F) Treating RN: Cornell Barman Primary Care Provider: Sharilyn Sites Other Clinician: Referring Provider: Sharilyn Sites Treating Provider/Extender: Tito Dine in Treatment: 0 Subjective Chief Complaint Information obtained from Patient 03/10/18; patient is here for review of wounds predominantly on her right calf with substantial full-thickness skin loss. 07/21/18; patient returns for review of dry gangrene in the left foot. She has had a right AKA History of Present Illness (HPI) ADMISSION 03/10/18; This is a 72 year old woman with a history of end-stage renal disease on hemodialysis, type 2 diabetes on insulin with excellent control, systemic lupus. Her problem began in October 2018 she developed a blister in an open wound on the right anterior tibial mid tibial area. This progressed and worsened until the tissue loss was substantial on the anterior and posterior right calf. she was followed in [place] by physical therapy at one point although I have not reviewed their notes. She apparently did not receive pulse lavage to the wound. I note that she was hospitalized from 03/01/18 through 03/03/18 predominantly for debridement of the wound by general surgery. She saw Dr. Arnoldo Morale who did not believe there was a role for surgical debridement. Was felt that definitive treatment would be amputation but the patient already been offered this by vein and vascular in [facility] and she refused arrangements were made for her to be sent to this clinic for review. The patient is already  had a reasonably substantial review for this problem. she saw vein and vascular extensively over the course of this year.her noninvasive arterial studies done in December 2018 suggested a right superficial femoral artery occlusion/stenosis. She did not have any issues on the left. At that point her ABI on the right was 0.8 with monophasic waveforms 1.06 on the left. However she had a velocity drop off in the superficial femoral artery. She went on to have diagnostic angiogram on 12/18/17 by Dr. Oneida Alar. This showed on the right that the common femoral, profunda femoris and superficial femoral arteries were all patent. There was a focal less than 1 mm length stenosis on the right superficial femoral artery otherwise the artery was widely patent. The right popliteal artery was widely patent. There was 3 vessel runoff via the anterior, tibial and paranoid Milta Deiters arteries to the right foot. She was not felt to have a vascular cause of these wounds. 04/28/18; really no progress at all. Fact the area of the right anterior leg is worse more necrotic tissue. Also more exposed tendon on the right posterior leg and I think the left leg is developing same thing. Her calf looks increasingly pale and almost mottled looking. She has had breakdown on her toes. The patient is seen her rheumatologist that [facility]. From this point of view I would wonder about antiphospholipid syndrome She is also seeing Dr. Shanon Rosser of vascular in [place]. By discussion with her husband I don't think Dr. Doren Custard felt that anything further was to be done even given the breakdown on the left leg. Also if I can review his notes to that effect She is seeing Dr. Lillia Carmel of [facility] dermatology this afternoon I am hopeful that their pathologist will give  [place] a diagnosis. I am increasingly worried that we are on her way to a similar situation in the left sHe's also had 2 separate biopsies. Firstly on 3/25 showed  nonspecific findings with calciphylaxis not entirely excluded. No definite calcium deposits were seen within the dermis within the sections reviewed. Vascular features were nonspecific. Furthermore pyogenic granuloma may demonstrate similar findings. 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 Calciphylaxis was not favored in this specimen. There was a calcified vessel. I don't see a pathology specimen that specifically says definitive calciphylaxis. The patient had an x-ray of her right tibia while she is in hospital there was no osteomyelitis. She had an MRI of her foot in [place]. ([id number]) March that did not show osteomyelitis as well The patient has been using Betadine and wraps to the wounds. This is being done by a home health where they live in [place] 03/17/18; patient admitted to clinic last week. 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 Considerable amount of necrotic surface material that I removed from the posterior calf last week and again this week. The area anteriorly is far too adherent at this point. She lives in [place]. She has advanced home care home health. Dialyzes in [place] as well. 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 There is very apparent necrotic material in the anterior side. We've been using Santyl anteriorly and silver alginate posteriorly. 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 I've gotten her an appointment at [facility] dermatology next week hopefully to have the original biopsy sites reviewed. The patient is still in a lot of pain. She is not straightening the leg out. She had an angiogram that did not suggest coexistent macrovascular disease 04/07/18; absolutely no improvement in this lady's condition.  Extensive wounds on the right anterior covered in necrotic surface and the right posterior calf. There is better looking tissue posteriorly on the right over there is a large exposed tendon. In addition today she seems to develop some wounds on her toes on the left foot which were new this week especially the left third toe most circumferential loss. This unfortunate woman is in a lot of pain and I once again I've talked to her about an amputation of the right leg which she doesn't want to consider. 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 She has a large necrotic covered wound on the right anterior tibia Large full-thickness wound on the right posterior tibia with healthier looking tissue but a large exposed piece of tendon Necrotic wound on the right lateral heel The new problem this week is wounds on her toes 04/14/18; this patient's overall condition in the right leg is unchanged she has 3 open areas of large necrotic area anteriorly. Better-looking surface but exposed tendon in the large wound posteriorly and an area on her right lateral calcaneus She has new wounds this week on the left first second and third toes Although these all look vascular previous arteriogram on the right did not show an obvious macrovascular etiology. This does not rule out a Small vesselvasculopathy. I have her being followed by Dr. Lillia Carmel at [facility] dermatology to see if they can review the slides already done to see if we can establish a specific etiology i.e. calciphylaxis, antiphospholipid  syndrome [patient has SLE]; or some other cause of this. I have not wanted to attempt to rebiopsy this in our clinic leaving that to the academic dermatologist's. I note she is now on Levaquin I can see in [place] that the dermatologist culture something that was Pseudomonas. I'm not sure what they cultured. 05/19/18; is more than a month since I  last saw this patient. She is not doing well. I have notes from [facility] dermatology a careful review by Dr. Hazle Quant. Apparently additional biopsies were done that did not suggest vasculitis or vasculopathy. In addition don't believe that there is any evidence of calcification suggesting calciphylaxis. She did culture MSSA and was given a course of Keflex which she is finished. They referred her to [facility] hematology to consider a workup for antiphospholipid syndrome. The patient has not done well. She is developed increasing areas of necrosis in the right anterior leg both superiorly and inferiorly. All of her toes on the right foot are ischemic as are on the left foot. She saw Dr. Doren Custard last month to noted her previously normal arteriogram I believe in April. I was really shocked a day to see how much worse this was. Patient rates her pain on the right leg at 8 out of 10 828/19; the patient's right leg continues to deteriorate with increasing necrotic material. This extends almost to her knee and into her forefoot. Her toes are mummified. She is in a lot of pain. Her primary physician put her on fentanyl patch 50 g per hour according to her husband which is really a large dose even though she is not opiate nave. She also has oxycodone when necessary. In any case I think she is agreed to consider an amputation. She has an appointment on September 6 with Dr. Doren Custard of [facility] I'm going to text him about this and also the mummification of her left toes READMISSION 07/21/18 Mrs. Berland returns to clinic today for review of her left foot. She has undergone a right AKA and this is healed well. This was done by Dr. [facility] in [place].Pathology of her amputation showed leg with ulceration and associated necrotizing inflammation. Calcific atherosclerosis of the medium-sized arteries without focal obstruction. During the last 2 visits she had in our clinic was  noteworthy that her left toes showed gangrenous changes/dry gangrene. Since she LAYLA, KESLING. ([id number]) was last here she has been followed by Dr. Wanita Chamberlain [facility] ..hematology in [facility]. She is thought to have antiphospholipid syndrome based on a positive lupus anticoagulant and a clinical .presentation of arterial thrombosis. As a understand her hematologist last note the panel was done on Eliquis. Her liquids was held for a few days and then the panel repeated. I'm not exactly sure I am looking at the final result of this however. The patient has dry gangrene of all of her toes with ischemic changes extending into her forefoot. She rates her pain as 8 out of 10 and states she cannot sleep. She is on fentanyl 25 g an hour and when necessary Percocet 5/325. She states that somebody is trying to taper her Percocet, short of a side effect I'm not really aware of she certainly isn't enough pain to justify continuing potent narcotics. They tell me she has a follow-up with Dr. Doren Custard in November at which time she is supposed to have noninvasive studies. Patient History Information obtained from Patient. Allergies ACE Inhibitors Family History Diabetes - Siblings, Hypertension -  Siblings,Father,Mother, Little Disease - Siblings, No family history of Cancer, Heart Disease, Hereditary Spherocytosis, Lung Disease, Seizures, Stroke, Thyroid Problems, Tuberculosis. Social History Former smoker - 10 yrs ago, Marital Status - Married, Alcohol Use - Never, Drug Use - No History, Caffeine Use - Never. Medical History Eyes Patient has history of Glaucoma Hematologic/Lymphatic Denies history of Hemophilia, Human Immunodeficiency Virus, Lymphedema, Sickle Cell Disease Respiratory Denies history of Aspiration, Asthma, Pneumothorax, Sleep Apnea, Tuberculosis Cardiovascular Denies history of Congestive Heart Failure, Coronary Artery Disease, Deep Vein Thrombosis, Hypotension,  Myocardial Infarction, Peripheral Arterial Disease, Peripheral Venous Disease, Phlebitis, Vasculitis Hospitalization/Surgery History - 06/08/2018, [facility], [facility]. Review of Systems (ROS) Eyes Complains or has symptoms of Glasses / Contacts - glasses. Hematologic/Lymphatic Denies complaints or symptoms of Bleeding / Clotting Disorders, Human Immunodeficiency Virus. Respiratory Denies complaints or symptoms of Chronic or frequent coughs, Shortness of Breath. Endocrine Denies complaints or symptoms of Hepatitis, Thyroid disease, Polydypsia (Excessive Thirst). Musculoskeletal Denies complaints or symptoms of Muscle Pain, Muscle Weakness. Malaika, Arnall Lakeisa F. ([id number]) Objective Constitutional Sitting or standing Blood Pressure is within target range for patient.. Pulse regular and within target range for patient.Marland Kitchen Respirations regular, non-labored and within target range.. Temperature is normal and within the target range for the patient.Marland Kitchen appears in no distress. Vitals Time Taken: 12:54 PM, Temperature: 98.0 F, Pulse: 83 bpm, Respiratory Rate: 16 breaths/min, Blood Pressure: 127/53 mmHg. Eyes Conjunctivae clear. No discharge. Respiratory Respiratory effort is easy and symmetric bilaterally. Rate is normal at rest and on room air.. Cardiovascular Pedal pulses are absent on the left. I cannot feel a popliteal pulse either. Her femoral pulses palpable. He has a dialysis catheter in the right subclavian. Gastrointestinal (GI) No liver or spleen enlargement or tenderness.. Lymphatic None palpable in the popliteal or inguinal area. Musculoskeletal Right AK stump is well-healed. General Notes: Wound exam; the patient really has no open wounds. She has dry gangrene of all of her toes with mummification. She also has spreading ischemic change up until her metatarsal heads. The rest of her foot still looks viable although it also looks ischemic. She has no open wounds noted drainage no  evidence of infection Integumentary (Hair, Skin) Within the ischemic dry gangrene in the left forefoot including all of her toes no other skin issues are seen. Wound #10 status is Open. Original cause of wound was Gradually Appeared. The wound is located on the Left,Distal Foot. The wound measures 8cm length x 21.5cm width x 0.1cm depth; 135.088cm^2 area and 13.509cm^3 volume. There is no tunneling or undermining noted. There is a none present amount of drainage noted. There is no granulation within the wound bed. There is a large (67-100%) amount of necrotic tissue within the wound bed including Eschar. The periwound skin appearance did not exhibit: Callus, Crepitus, Excoriation, Induration, Rash, Scarring, Dry/Scaly, Maceration, Atrophie Blanche, Cyanosis, Ecchymosis, Hemosiderin Staining, Mottled, Pallor, Rubor, Erythema. Assessment Active Problems ICD-10 Antiphospholipid syndrome Type 2 diabetes mellitus with foot ulcer Type 2 diabetes mellitus with diabetic peripheral angiopathy with gangrene Type 2 diabetes mellitus with diabetic chronic Little disease DREAMA, KUNA ([id number]) Plan Discharge From [facility] Services: Wound #10 Left,Distal Foot: Discharge from [place] - Patient to follow-up with Dr. Doren Custard. Will call wound care center in the future if needed. #1 progressive ischemia with gangrene in the left foot including her toes and proximal forefoot. She is on a lot of pain #2 antiphospholipid syndrome resulting in considerable tissue loss in her right calf necessitating a right above-knee amputation done by  Dr. Doren Custard. This is well-healed #3 I suspect the patient is going to need a left amputation as well. She is in a lot of pain which doesn't seem to be well controlled. #4 at this point I don't think there is anything we can do to help this patient. She will need when she decides she is ready to have a left-sided amputation as well #5 we have advised her that should  she have further tissue breakdown and needs our assistance to call us and we'll be glad to bring her back in. Electronic Signature(s) Signed: 07/21/2018 4:09:45 PM By: Linton Ham MD Entered By: Linton Ham on 07/21/2018 14:03:42 [name] ([id number]) -------------------------------------------------------------------------------- ROS/PFSH Details Patient Name: [name]. Date of Service: 07/21/2018 12:30 PM Medical Record Number: [id number] Patient Account Number: [id number] Date of Birth/Sex: Apr 23, 1946 (72 y.o. F) Treating RN: Secundino Ginger Primary Care Provider: Sharilyn Sites Other Clinician: Referring Provider: Sharilyn Sites Treating Provider/Extender: Tito Dine in Treatment: 0 Information Obtained From Patient Wound History Eyes Complaints and Symptoms: Positive for: Glasses / Contacts - glasses Medical History: Positive for: Cataracts - surgery; Glaucoma Hematologic/Lymphatic Complaints and Symptoms: Negative for: Bleeding / Clotting Disorders; Human Immunodeficiency Virus Medical History: Positive for: Anemia Negative for: Hemophilia; Human Immunodeficiency Virus; Lymphedema; Sickle Cell Disease Respiratory Complaints and Symptoms: Negative for: Chronic or frequent coughs; Shortness of Breath Medical History: Positive for: Chronic Obstructive Pulmonary Disease (COPD) Negative for: Aspiration; Asthma; Pneumothorax; Sleep Apnea; Tuberculosis Endocrine Complaints and Symptoms: Negative for: Hepatitis; Thyroid disease; Polydypsia (Excessive Thirst) Medical History: Positive for: Type II Diabetes Time with diabetes: 40 yrs Treated with: Insulin Blood sugar tested every day: Yes Tested : Musculoskeletal Complaints and Symptoms: Negative for: Muscle Pain; Muscle Weakness Medical History: Positive for: Gout; Rheumatoid Arthritis KONNER, WARRIOR ([id number]) Cardiovascular Medical History: Positive for: Arrhythmia -  a-fib; Hypertension Negative for: Congestive Heart Failure; Coronary Artery Disease; Deep Vein Thrombosis; Hypotension; Myocardial Infarction; Peripheral Arterial Disease; Peripheral Venous Disease; Phlebitis; Vasculitis Genitourinary Medical History: Positive for: End Stage Renal Disease HBO Extended History Items Eyes: Eyes: Cataracts Glaucoma Immunizations Pneumococcal Vaccine: Received Pneumococcal Vaccination: Yes Implantable Devices Hospitalization / Surgery History Name of Hospital Purpose of Hospitalization/Surgery Date [facility] 06/08/2018 Family and Social History Cancer: No; Diabetes: Yes - Siblings; Heart Disease: No; Hereditary Spherocytosis: No; Hypertension: Yes - Siblings,Father,Mother; Little Disease: Yes - Siblings; Lung Disease: No; Seizures: No; Stroke: No; Thyroid Problems: No; Tuberculosis: No; Former smoker - 10 yrs ago; Marital Status - Married; Alcohol Use: Never; Drug Use: No History; Caffeine Use: Never; Financial Concerns: No; Food, Clothing or Shelter Needs: No; Support System Lacking: No; Transportation Concerns: No; Advanced Directives: No; Patient does not want information on Advanced Directives; Do not resuscitate: No; Living Will: No; Medical Power of Attorney: No Electronic Signature(s) Signed: 07/21/2018 4:05:05 PM By: Secundino Ginger Signed: 07/21/2018 4:09:45 PM By: Linton Ham MD Entered By: Secundino Ginger on 07/21/2018 13:03:23 [name] ([id number]) -------------------------------------------------------------------------------- [place] Details Patient Name: [name]. Date of Service: 07/21/2018 Medical Record Number: [id number] Patient Account Number: [id number] Date of Birth/Sex: [birth year] (72 y.o. F) Treating RN: Cornell Barman Primary Care Provider: Sharilyn Sites Other Clinician: Referring Provider: Sharilyn Sites Treating Provider/Extender: Tito Dine in Treatment: 0 Diagnosis Coding ICD-10 Codes Code  Description [phone] Antiphospholipid syndrome E11.621 Type 2 diabetes mellitus with foot ulcer E11.52 Type 2 diabetes mellitus with diabetic peripheral angiopathy with gangrene E11.22 Type 2 diabetes mellitus with diabetic chronic Little disease Facility Procedures CPT4 Code: [id number]  Description: 99213 - WOUND CARE VISIT-LEV 3 EST PT Modifier: Quantity: 1 Physician Procedures CPT4 Code: 9179150 Description: 56979 - WC PHYS LEVEL 3 - EST PT ICD-10 Diagnosis Description E11.52 Type 2 diabetes mellitus with diabetic peripheral angiopathy D68.61 Antiphospholipid syndrome Modifier: with gangrene Quantity: 1 Electronic Signature(s) Signed: 07/21/2018 4:15:47 PM By: Gretta Cool, BSN, RN, CWS, Kim RN, BSN Previous Signature: 07/21/2018 4:09:45 PM Version By: Linton Ham MD Entered By: Gretta Cool, BSN, RN, CWS, Kim on 07/21/2018 16:15:47

## 2018-07-24 DIAGNOSIS — D689 Coagulation defect, unspecified: Secondary | ICD-10-CM | POA: Diagnosis not present

## 2018-07-24 DIAGNOSIS — E039 Hypothyroidism, unspecified: Secondary | ICD-10-CM | POA: Diagnosis not present

## 2018-07-24 DIAGNOSIS — R52 Pain, unspecified: Secondary | ICD-10-CM | POA: Diagnosis not present

## 2018-07-24 DIAGNOSIS — D631 Anemia in chronic kidney disease: Secondary | ICD-10-CM | POA: Diagnosis not present

## 2018-07-24 DIAGNOSIS — E876 Hypokalemia: Secondary | ICD-10-CM | POA: Diagnosis not present

## 2018-07-24 DIAGNOSIS — N186 End stage renal disease: Secondary | ICD-10-CM | POA: Diagnosis not present

## 2018-07-24 DIAGNOSIS — Z23 Encounter for immunization: Secondary | ICD-10-CM | POA: Diagnosis not present

## 2018-07-24 DIAGNOSIS — N2581 Secondary hyperparathyroidism of renal origin: Secondary | ICD-10-CM | POA: Diagnosis not present

## 2018-07-24 DIAGNOSIS — E1129 Type 2 diabetes mellitus with other diabetic kidney complication: Secondary | ICD-10-CM | POA: Diagnosis not present

## 2018-07-24 DIAGNOSIS — D509 Iron deficiency anemia, unspecified: Secondary | ICD-10-CM | POA: Diagnosis not present

## 2018-07-26 ENCOUNTER — Telehealth: Payer: Self-pay | Admitting: Cardiology

## 2018-07-26 DIAGNOSIS — I4891 Unspecified atrial fibrillation: Secondary | ICD-10-CM | POA: Diagnosis not present

## 2018-07-26 DIAGNOSIS — Z992 Dependence on renal dialysis: Secondary | ICD-10-CM | POA: Diagnosis not present

## 2018-07-26 DIAGNOSIS — H409 Unspecified glaucoma: Secondary | ICD-10-CM | POA: Diagnosis not present

## 2018-07-26 DIAGNOSIS — N186 End stage renal disease: Secondary | ICD-10-CM | POA: Diagnosis not present

## 2018-07-26 DIAGNOSIS — E1122 Type 2 diabetes mellitus with diabetic chronic kidney disease: Secondary | ICD-10-CM | POA: Diagnosis not present

## 2018-07-26 DIAGNOSIS — Z87891 Personal history of nicotine dependence: Secondary | ICD-10-CM | POA: Diagnosis not present

## 2018-07-26 DIAGNOSIS — Z4781 Encounter for orthopedic aftercare following surgical amputation: Secondary | ICD-10-CM | POA: Diagnosis not present

## 2018-07-26 DIAGNOSIS — I872 Venous insufficiency (chronic) (peripheral): Secondary | ICD-10-CM | POA: Diagnosis not present

## 2018-07-26 DIAGNOSIS — D631 Anemia in chronic kidney disease: Secondary | ICD-10-CM | POA: Diagnosis not present

## 2018-07-26 DIAGNOSIS — Z89611 Acquired absence of right leg above knee: Secondary | ICD-10-CM | POA: Diagnosis not present

## 2018-07-26 DIAGNOSIS — E1151 Type 2 diabetes mellitus with diabetic peripheral angiopathy without gangrene: Secondary | ICD-10-CM | POA: Diagnosis not present

## 2018-07-26 DIAGNOSIS — Z794 Long term (current) use of insulin: Secondary | ICD-10-CM | POA: Diagnosis not present

## 2018-07-26 DIAGNOSIS — I129 Hypertensive chronic kidney disease with stage 1 through stage 4 chronic kidney disease, or unspecified chronic kidney disease: Secondary | ICD-10-CM | POA: Diagnosis not present

## 2018-07-26 DIAGNOSIS — M150 Primary generalized (osteo)arthritis: Secondary | ICD-10-CM | POA: Diagnosis not present

## 2018-07-26 DIAGNOSIS — E785 Hyperlipidemia, unspecified: Secondary | ICD-10-CM | POA: Diagnosis not present

## 2018-07-26 DIAGNOSIS — J449 Chronic obstructive pulmonary disease, unspecified: Secondary | ICD-10-CM | POA: Diagnosis not present

## 2018-07-26 DIAGNOSIS — E1142 Type 2 diabetes mellitus with diabetic polyneuropathy: Secondary | ICD-10-CM | POA: Diagnosis not present

## 2018-07-26 DIAGNOSIS — E11319 Type 2 diabetes mellitus with unspecified diabetic retinopathy without macular edema: Secondary | ICD-10-CM | POA: Diagnosis not present

## 2018-07-26 DIAGNOSIS — Z86718 Personal history of other venous thrombosis and embolism: Secondary | ICD-10-CM | POA: Diagnosis not present

## 2018-07-26 DIAGNOSIS — M109 Gout, unspecified: Secondary | ICD-10-CM | POA: Diagnosis not present

## 2018-07-26 DIAGNOSIS — M329 Systemic lupus erythematosus, unspecified: Secondary | ICD-10-CM | POA: Diagnosis not present

## 2018-07-26 DIAGNOSIS — M1990 Unspecified osteoarthritis, unspecified site: Secondary | ICD-10-CM | POA: Diagnosis not present

## 2018-07-26 NOTE — Telephone Encounter (Signed)
Believe that part of her monitor is not working

## 2018-07-26 NOTE — Telephone Encounter (Signed)
[employer] is mailing a new battery to them

## 2018-07-28 ENCOUNTER — Ambulatory Visit (HOSPITAL_COMMUNITY)
Admission: RE | Admit: 2018-07-28 | Discharge: 2018-07-28 | Disposition: A | Discharge status: Home / Self Care | Payer: Medicare Other | Source: Ambulatory Visit | Attending: Nephrology | Admitting: Nephrology

## 2018-07-28 DIAGNOSIS — I2510 Atherosclerotic heart disease of native coronary artery without angina pectoris: Secondary | ICD-10-CM | POA: Insufficient documentation

## 2018-07-28 DIAGNOSIS — N186 End stage renal disease: Secondary | ICD-10-CM | POA: Diagnosis not present

## 2018-07-28 DIAGNOSIS — I700 Atherosclerosis of aorta: Secondary | ICD-10-CM | POA: Diagnosis not present

## 2018-07-28 DIAGNOSIS — Z86718 Personal history of other venous thrombosis and embolism: Secondary | ICD-10-CM | POA: Diagnosis not present

## 2018-07-28 DIAGNOSIS — Z4781 Encounter for orthopedic aftercare following surgical amputation: Secondary | ICD-10-CM | POA: Diagnosis not present

## 2018-07-28 DIAGNOSIS — M1990 Unspecified osteoarthritis, unspecified site: Secondary | ICD-10-CM | POA: Diagnosis not present

## 2018-07-28 DIAGNOSIS — I517 Cardiomegaly: Secondary | ICD-10-CM | POA: Insufficient documentation

## 2018-07-28 DIAGNOSIS — Z992 Dependence on renal dialysis: Secondary | ICD-10-CM | POA: Diagnosis not present

## 2018-07-28 DIAGNOSIS — M109 Gout, unspecified: Secondary | ICD-10-CM | POA: Diagnosis not present

## 2018-07-28 DIAGNOSIS — I129 Hypertensive chronic kidney disease with stage 1 through stage 4 chronic kidney disease, or unspecified chronic kidney disease: Secondary | ICD-10-CM | POA: Diagnosis not present

## 2018-07-28 DIAGNOSIS — Z89611 Acquired absence of right leg above knee: Secondary | ICD-10-CM | POA: Diagnosis not present

## 2018-07-28 DIAGNOSIS — Z87891 Personal history of nicotine dependence: Secondary | ICD-10-CM | POA: Diagnosis not present

## 2018-07-28 DIAGNOSIS — E1151 Type 2 diabetes mellitus with diabetic peripheral angiopathy without gangrene: Secondary | ICD-10-CM | POA: Diagnosis not present

## 2018-07-28 DIAGNOSIS — M329 Systemic lupus erythematosus, unspecified: Secondary | ICD-10-CM | POA: Diagnosis not present

## 2018-07-28 DIAGNOSIS — D631 Anemia in chronic kidney disease: Secondary | ICD-10-CM | POA: Diagnosis not present

## 2018-07-28 DIAGNOSIS — M150 Primary generalized (osteo)arthritis: Secondary | ICD-10-CM | POA: Diagnosis not present

## 2018-07-28 DIAGNOSIS — Z794 Long term (current) use of insulin: Secondary | ICD-10-CM | POA: Diagnosis not present

## 2018-07-28 DIAGNOSIS — R1031 Right lower quadrant pain: Secondary | ICD-10-CM

## 2018-07-28 DIAGNOSIS — I872 Venous insufficiency (chronic) (peripheral): Secondary | ICD-10-CM | POA: Diagnosis not present

## 2018-07-28 DIAGNOSIS — N184 Chronic kidney disease, stage 4 (severe): Secondary | ICD-10-CM | POA: Insufficient documentation

## 2018-07-28 DIAGNOSIS — H409 Unspecified glaucoma: Secondary | ICD-10-CM | POA: Diagnosis not present

## 2018-07-28 DIAGNOSIS — J449 Chronic obstructive pulmonary disease, unspecified: Secondary | ICD-10-CM | POA: Diagnosis not present

## 2018-07-28 DIAGNOSIS — I4891 Unspecified atrial fibrillation: Secondary | ICD-10-CM | POA: Diagnosis not present

## 2018-07-28 DIAGNOSIS — E11319 Type 2 diabetes mellitus with unspecified diabetic retinopathy without macular edema: Secondary | ICD-10-CM | POA: Diagnosis not present

## 2018-07-28 DIAGNOSIS — N2889 Other specified disorders of kidney and ureter: Secondary | ICD-10-CM | POA: Insufficient documentation

## 2018-07-28 DIAGNOSIS — E785 Hyperlipidemia, unspecified: Secondary | ICD-10-CM | POA: Diagnosis not present

## 2018-07-28 DIAGNOSIS — E1142 Type 2 diabetes mellitus with diabetic polyneuropathy: Secondary | ICD-10-CM | POA: Diagnosis not present

## 2018-07-28 DIAGNOSIS — E1122 Type 2 diabetes mellitus with diabetic chronic kidney disease: Secondary | ICD-10-CM | POA: Diagnosis not present

## 2018-07-28 MED ORDER — IOPAMIDOL (ISOVUE-300) INJECTION 61%
100.0000 mL | Freq: Once | INTRAVENOUS | Status: AC | PRN
  Administered 2018-07-28: 100 mL via INTRAVENOUS

## 2018-07-29 ENCOUNTER — Observation Stay (HOSPITAL_COMMUNITY)
Admission: EM | Admit: 2018-07-29 | Discharge: 2018-07-30 | Disposition: A | Discharge status: Home / Self Care | Payer: Medicare Other | Attending: Family Medicine | Admitting: Family Medicine

## 2018-07-29 ENCOUNTER — Other Ambulatory Visit: Payer: Self-pay

## 2018-07-29 ENCOUNTER — Encounter (HOSPITAL_COMMUNITY): Payer: Self-pay

## 2018-07-29 DIAGNOSIS — Y69 Unspecified misadventure during surgical and medical care: Secondary | ICD-10-CM | POA: Diagnosis not present

## 2018-07-29 DIAGNOSIS — R531 Weakness: Secondary | ICD-10-CM | POA: Diagnosis not present

## 2018-07-29 DIAGNOSIS — I4891 Unspecified atrial fibrillation: Secondary | ICD-10-CM | POA: Insufficient documentation

## 2018-07-29 DIAGNOSIS — E1140 Type 2 diabetes mellitus with diabetic neuropathy, unspecified: Secondary | ICD-10-CM | POA: Insufficient documentation

## 2018-07-29 DIAGNOSIS — E1129 Type 2 diabetes mellitus with other diabetic kidney complication: Secondary | ICD-10-CM | POA: Diagnosis not present

## 2018-07-29 DIAGNOSIS — E160 Drug-induced hypoglycemia without coma: Principal | ICD-10-CM | POA: Insufficient documentation

## 2018-07-29 DIAGNOSIS — E162 Hypoglycemia, unspecified: Secondary | ICD-10-CM | POA: Diagnosis present

## 2018-07-29 DIAGNOSIS — T383X5A Adverse effect of insulin and oral hypoglycemic [antidiabetic] drugs, initial encounter: Secondary | ICD-10-CM | POA: Insufficient documentation

## 2018-07-29 DIAGNOSIS — I96 Gangrene, not elsewhere classified: Secondary | ICD-10-CM

## 2018-07-29 DIAGNOSIS — I70262 Atherosclerosis of native arteries of extremities with gangrene, left leg: Secondary | ICD-10-CM | POA: Insufficient documentation

## 2018-07-29 DIAGNOSIS — I10 Essential (primary) hypertension: Secondary | ICD-10-CM | POA: Diagnosis present

## 2018-07-29 DIAGNOSIS — M329 Systemic lupus erythematosus, unspecified: Secondary | ICD-10-CM | POA: Diagnosis not present

## 2018-07-29 DIAGNOSIS — Z794 Long term (current) use of insulin: Secondary | ICD-10-CM | POA: Insufficient documentation

## 2018-07-29 DIAGNOSIS — Z992 Dependence on renal dialysis: Secondary | ICD-10-CM | POA: Diagnosis not present

## 2018-07-29 DIAGNOSIS — I5032 Chronic diastolic (congestive) heart failure: Secondary | ICD-10-CM | POA: Diagnosis not present

## 2018-07-29 DIAGNOSIS — M3210 Systemic lupus erythematosus, organ or system involvement unspecified: Secondary | ICD-10-CM | POA: Insufficient documentation

## 2018-07-29 DIAGNOSIS — N186 End stage renal disease: Secondary | ICD-10-CM | POA: Diagnosis not present

## 2018-07-29 DIAGNOSIS — E161 Other hypoglycemia: Secondary | ICD-10-CM | POA: Diagnosis not present

## 2018-07-29 DIAGNOSIS — J45909 Unspecified asthma, uncomplicated: Secondary | ICD-10-CM | POA: Insufficient documentation

## 2018-07-29 DIAGNOSIS — I132 Hypertensive heart and chronic kidney disease with heart failure and with stage 5 chronic kidney disease, or end stage renal disease: Secondary | ICD-10-CM | POA: Diagnosis not present

## 2018-07-29 DIAGNOSIS — E11649 Type 2 diabetes mellitus with hypoglycemia without coma: Secondary | ICD-10-CM | POA: Diagnosis not present

## 2018-07-29 DIAGNOSIS — Z7901 Long term (current) use of anticoagulants: Secondary | ICD-10-CM | POA: Diagnosis not present

## 2018-07-29 DIAGNOSIS — E1121 Type 2 diabetes mellitus with diabetic nephropathy: Secondary | ICD-10-CM | POA: Diagnosis present

## 2018-07-29 DIAGNOSIS — I959 Hypotension, unspecified: Secondary | ICD-10-CM | POA: Diagnosis not present

## 2018-07-29 DIAGNOSIS — T887XXA Unspecified adverse effect of drug or medicament, initial encounter: Secondary | ICD-10-CM | POA: Insufficient documentation

## 2018-07-29 LAB — COMPREHENSIVE METABOLIC PANEL
ALK PHOS: 59 U/L (ref 38–126)
ALT: 11 U/L (ref 0–44)
AST: 18 U/L (ref 15–41)
Albumin: 2.3 g/dL — ABNORMAL LOW (ref 3.5–5.0)
Anion gap: 15 (ref 5–15)
BILIRUBIN TOTAL: 0.5 mg/dL (ref 0.3–1.2)
BUN: 123 mg/dL — ABNORMAL HIGH (ref 8–23)
CALCIUM: 8.4 mg/dL — AB (ref 8.9–10.3)
CO2: 19 mmol/L — AB (ref 22–32)
CREATININE: 7.81 mg/dL — AB (ref 0.44–1.00)
Chloride: 100 mmol/L (ref 98–111)
GFR calc non Af Amer: 5 mL/min — ABNORMAL LOW (ref >60)
GFR, EST AFRICAN AMERICAN: 5 mL/min — AB (ref >60)
Glucose, Bld: 96 mg/dL (ref 70–99)
Potassium: 4.5 mmol/L (ref 3.5–5.1)
SODIUM: 134 mmol/L — AB (ref 135–145)
TOTAL PROTEIN: 6.5 g/dL (ref 6.5–8.1)

## 2018-07-29 LAB — CBC WITH DIFFERENTIAL/PLATELET
ABS IMMATURE GRANULOCYTES: 0.03 10*3/uL (ref 0.00–0.07)
Basophils Absolute: 0 10*3/uL (ref 0.0–0.1)
Basophils Relative: 0 %
EOS PCT: 1 %
Eosinophils Absolute: 0.1 10*3/uL (ref 0.0–0.5)
HEMATOCRIT: 38.4 % (ref 36.0–46.0)
HEMOGLOBIN: 10.9 g/dL — AB (ref 12.0–15.0)
Immature Granulocytes: 0 %
LYMPHS ABS: 0.4 10*3/uL — AB (ref 0.7–4.0)
LYMPHS PCT: 5 %
MCH: 27.5 pg (ref 26.0–34.0)
MCHC: 28.4 g/dL — ABNORMAL LOW (ref 30.0–36.0)
MCV: 96.7 fL (ref 80.0–100.0)
MONO ABS: 0.7 10*3/uL (ref 0.1–1.0)
MONOS PCT: 9 %
NEUTROS ABS: 6.7 10*3/uL (ref 1.7–7.7)
Neutrophils Relative %: 85 %
Platelets: 237 10*3/uL (ref 150–400)
RBC: 3.97 MIL/uL (ref 3.87–5.11)
RDW: 18.5 % — ABNORMAL HIGH (ref 11.5–15.5)
WBC: 7.9 10*3/uL (ref 4.0–10.5)
nRBC: 0 % (ref 0.0–0.2)

## 2018-07-29 LAB — CBG MONITORING, ED
GLUCOSE-CAPILLARY: 55 mg/dL — AB (ref 70–99)
Glucose-Capillary: 44 mg/dL — CL (ref 70–99)
Glucose-Capillary: 114 mg/dL — ABNORMAL HIGH (ref 70–99)

## 2018-07-29 LAB — GLUCOSE, CAPILLARY: GLUCOSE-CAPILLARY: 201 mg/dL — AB (ref 70–99)

## 2018-07-29 MED ORDER — INSULIN ASPART 100 UNIT/ML ~~LOC~~ SOLN: 2.0000 [IU] | Freq: Three times a day (TID) | SUBCUTANEOUS | Status: DC

## 2018-07-29 MED ORDER — CALCIUM ACETATE (PHOS BINDER) 667 MG PO CAPS
667.0000 mg | ORAL_CAPSULE | Freq: Three times a day (TID) | ORAL | Status: DC
  Administered 2018-07-29 – 2018-07-30 (×3): 667 mg via ORAL
  Filled 2018-07-29 (×6): qty 1

## 2018-07-29 MED ORDER — SODIUM CHLORIDE 0.9 % IV SOLN: 250.0000 mL | INTRAVENOUS | Status: DC | PRN

## 2018-07-29 MED ORDER — SODIUM CHLORIDE 0.9 % IV SOLN: 100.0000 mL | INTRAVENOUS | Status: DC | PRN

## 2018-07-29 MED ORDER — METOPROLOL TARTRATE 25 MG PO TABS
12.5000 mg | ORAL_TABLET | ORAL | Status: DC
  Administered 2018-07-30: 12.5 mg via ORAL
  Filled 2018-07-29: qty 1

## 2018-07-29 MED ORDER — POLYETHYLENE GLYCOL 3350 17 G PO PACK: 17.0000 g | PACK | Freq: Every day | ORAL | Status: DC | PRN

## 2018-07-29 MED ORDER — PRO-STAT SUGAR FREE PO LIQD
30.0000 mL | Freq: Two times a day (BID) | ORAL | Status: DC
  Filled 2018-07-29 (×3): qty 30

## 2018-07-29 MED ORDER — DEXTROSE 10 % IV SOLN
INTRAVENOUS | Status: DC
  Administered 2018-07-29 – 2018-07-30 (×2): via INTRAVENOUS
  Filled 2018-07-29: qty 1000

## 2018-07-29 MED ORDER — PREDNISONE 10 MG PO TABS
10.0000 mg | ORAL_TABLET | Freq: Every day | ORAL | Status: DC
  Administered 2018-07-30: 10 mg via ORAL
  Filled 2018-07-29: qty 1

## 2018-07-29 MED ORDER — ONDANSETRON HCL 4 MG/2ML IJ SOLN: 4.0000 mg | Freq: Four times a day (QID) | INTRAMUSCULAR | Status: DC | PRN

## 2018-07-29 MED ORDER — BRIMONIDINE TARTRATE 0.2 % OP SOLN
1.0000 [drp] | Freq: Two times a day (BID) | OPHTHALMIC | Status: DC
  Administered 2018-07-29 – 2018-07-30 (×2): 1 [drp] via OPHTHALMIC
  Filled 2018-07-29 (×2): qty 5

## 2018-07-29 MED ORDER — ALLOPURINOL 100 MG PO TABS
100.0000 mg | ORAL_TABLET | ORAL | Status: DC
  Administered 2018-07-30: 100 mg via ORAL
  Filled 2018-07-29 (×2): qty 1

## 2018-07-29 MED ORDER — HYDRALAZINE HCL 20 MG/ML IJ SOLN: 10.0000 mg | Freq: Four times a day (QID) | INTRAMUSCULAR | Status: DC | PRN

## 2018-07-29 MED ORDER — INSULIN DETEMIR 100 UNIT/ML ~~LOC~~ SOLN
5.0000 [IU] | Freq: Every morning | SUBCUTANEOUS | Status: DC
  Administered 2018-07-30: 5 [IU] via SUBCUTANEOUS
  Filled 2018-07-29 (×4): qty 0.05

## 2018-07-29 MED ORDER — ACETAMINOPHEN 325 MG PO TABS: 650.0000 mg | ORAL_TABLET | Freq: Four times a day (QID) | ORAL | Status: DC | PRN

## 2018-07-29 MED ORDER — CHLORHEXIDINE GLUCONATE CLOTH 2 % EX PADS
6.0000 | MEDICATED_PAD | Freq: Every day | CUTANEOUS | Status: DC
  Administered 2018-07-30: 6 via TOPICAL

## 2018-07-29 MED ORDER — ALTEPLASE 2 MG IJ SOLR
2.0000 mg | Freq: Once | INTRAMUSCULAR | Status: DC | PRN
  Filled 2018-07-29: qty 2

## 2018-07-29 MED ORDER — HEPARIN SODIUM (PORCINE) 5000 UNIT/ML IJ SOLN: 5000.0000 [IU] | Freq: Three times a day (TID) | INTRAMUSCULAR | Status: DC

## 2018-07-29 MED ORDER — AMIODARONE HCL 200 MG PO TABS
200.0000 mg | ORAL_TABLET | Freq: Every day | ORAL | Status: DC
  Administered 2018-07-30: 200 mg via ORAL
  Filled 2018-07-29: qty 1

## 2018-07-29 MED ORDER — OXYCODONE-ACETAMINOPHEN 5-325 MG PO TABS
1.0000 | ORAL_TABLET | Freq: Four times a day (QID) | ORAL | Status: DC | PRN
  Administered 2018-07-29 – 2018-07-30 (×3): 1 via ORAL
  Filled 2018-07-29 (×3): qty 1

## 2018-07-29 MED ORDER — NEPHRO-VITE 0.8 MG PO TABS
1.0000 | ORAL_TABLET | Freq: Every day | ORAL | Status: DC
  Administered 2018-07-29 – 2018-07-30 (×2): 1 via ORAL
  Filled 2018-07-29 (×7): qty 1

## 2018-07-29 MED ORDER — INSULIN ASPART 100 UNIT/ML ~~LOC~~ SOLN: 5.0000 [IU] | Freq: Three times a day (TID) | SUBCUTANEOUS | Status: DC | PRN

## 2018-07-29 MED ORDER — FOLIC ACID 1 MG PO TABS
1.0000 mg | ORAL_TABLET | Freq: Every day | ORAL | Status: DC
  Administered 2018-07-30: 1 mg via ORAL
  Filled 2018-07-29: qty 1

## 2018-07-29 MED ORDER — ONDANSETRON HCL 4 MG PO TABS: 4.0000 mg | ORAL_TABLET | Freq: Four times a day (QID) | ORAL | Status: DC | PRN

## 2018-07-29 MED ORDER — DEXTROSE 50 % IV SOLN
50.0000 mL | Freq: Once | INTRAVENOUS | Status: AC
  Administered 2018-07-29: 50 mL via INTRAVENOUS

## 2018-07-29 MED ORDER — ALBUTEROL SULFATE (2.5 MG/3ML) 0.083% IN NEBU: 3.0000 mL | INHALATION_SOLUTION | Freq: Four times a day (QID) | RESPIRATORY_TRACT | Status: DC | PRN

## 2018-07-29 MED ORDER — ALBUTEROL SULFATE (2.5 MG/3ML) 0.083% IN NEBU: 2.5000 mg | INHALATION_SOLUTION | RESPIRATORY_TRACT | Status: DC | PRN

## 2018-07-29 MED ORDER — HYDROCORTISONE NA SUCCINATE PF 100 MG IJ SOLR
50.0000 mg | Freq: Once | INTRAMUSCULAR | Status: AC
  Administered 2018-07-29: 50 mg via INTRAVENOUS
  Filled 2018-07-29: qty 2

## 2018-07-29 MED ORDER — PANTOPRAZOLE SODIUM 40 MG PO TBEC
40.0000 mg | DELAYED_RELEASE_TABLET | Freq: Every day | ORAL | Status: DC
  Administered 2018-07-30: 40 mg via ORAL
  Filled 2018-07-29: qty 1

## 2018-07-29 MED ORDER — ACETAMINOPHEN 650 MG RE SUPP: 650.0000 mg | Freq: Four times a day (QID) | RECTAL | Status: DC | PRN

## 2018-07-29 MED ORDER — APIXABAN 5 MG PO TABS
5.0000 mg | ORAL_TABLET | Freq: Two times a day (BID) | ORAL | Status: DC
  Administered 2018-07-29 – 2018-07-30 (×2): 5 mg via ORAL
  Filled 2018-07-29 (×2): qty 1

## 2018-07-29 MED ORDER — SODIUM CHLORIDE 0.9% FLUSH
3.0000 mL | Freq: Two times a day (BID) | INTRAVENOUS | Status: DC
  Administered 2018-07-30: 3 mL via INTRAVENOUS

## 2018-07-29 MED ORDER — HEPARIN SODIUM (PORCINE) 1000 UNIT/ML DIALYSIS
1000.0000 [IU] | INTRAMUSCULAR | Status: DC | PRN
  Filled 2018-07-29: qty 1

## 2018-07-29 MED ORDER — TIMOLOL MALEATE 0.5 % OP SOLN
1.0000 [drp] | Freq: Two times a day (BID) | OPHTHALMIC | Status: DC
  Administered 2018-07-29 – 2018-07-30 (×2): 1 [drp] via OPHTHALMIC
  Filled 2018-07-29 (×2): qty 5

## 2018-07-29 MED ORDER — FENTANYL 25 MCG/HR TD PT72
25.0000 ug | MEDICATED_PATCH | TRANSDERMAL | Status: DC
  Filled 2018-07-29: qty 1

## 2018-07-29 MED ORDER — TRAZODONE HCL 50 MG PO TABS: 50.0000 mg | ORAL_TABLET | Freq: Every evening | ORAL | Status: DC | PRN

## 2018-07-29 MED ORDER — DEXTROSE 50 % IV SOLN
INTRAVENOUS | Status: AC
  Filled 2018-07-29: qty 50

## 2018-07-29 MED ORDER — DEXTROSE 50 % IV SOLN
1.0000 | Freq: Once | INTRAVENOUS | Status: AC
  Administered 2018-07-29: 50 mL via INTRAVENOUS

## 2018-07-29 MED ORDER — PREDNISONE 10 MG PO TABS: 5.0000 mg | ORAL_TABLET | Freq: Every day | ORAL | Status: DC

## 2018-07-29 MED ORDER — SODIUM CHLORIDE 0.9% FLUSH: 3.0000 mL | INTRAVENOUS | Status: DC | PRN

## 2018-07-29 MED ORDER — BRIMONIDINE TARTRATE-TIMOLOL 0.2-0.5 % OP SOLN: 1.0000 [drp] | Freq: Two times a day (BID) | OPHTHALMIC | Status: DC

## 2018-07-29 MED ORDER — HYDROXYCHLOROQUINE SULFATE 200 MG PO TABS
200.0000 mg | ORAL_TABLET | Freq: Every day | ORAL | Status: DC
  Administered 2018-07-30: 200 mg via ORAL
  Filled 2018-07-29 (×2): qty 1

## 2018-07-29 MED ORDER — TRAVOPROST 0.004 % OP SOLN: 1.0000 [drp] | Freq: Every day | OPHTHALMIC | Status: DC

## 2018-07-29 MED ORDER — ATORVASTATIN CALCIUM 20 MG PO TABS
20.0000 mg | ORAL_TABLET | Freq: Every day | ORAL | Status: DC
  Administered 2018-07-29: 20 mg via ORAL
  Filled 2018-07-29: qty 1

## 2018-07-29 MED ORDER — DEXTROSE 50 % IV SOLN
INTRAVENOUS | Status: AC
  Administered 2018-07-29: 50 mL via INTRAVENOUS
  Filled 2018-07-29: qty 50

## 2018-07-29 MED ORDER — LATANOPROST 0.005 % OP SOLN
1.0000 [drp] | Freq: Every day | OPHTHALMIC | Status: DC
  Administered 2018-07-29: 1 [drp] via OPHTHALMIC
  Filled 2018-07-29: qty 2.5

## 2018-07-29 NOTE — ED Triage Notes (Signed)
EMS reports pt didn't feel well yesterday so she didn't go to dialysis.  Today c/o extreme generalized weakness.  EMS reports initial cbg was 58, unable to obtain iv access so gave oral glucose, last cbg was 50.  Pt alert and oriented.  C/O pain in r hip but says is chronic.

## 2018-07-29 NOTE — ED Notes (Signed)
Given breakfast tray. 

## 2018-07-29 NOTE — Consult Note (Signed)
Reason for Consult: End-stage renal disease Referring Physician: Dr.Yelverton  [name] is an 72 y.o. female.  HPI: She is a patient who has history of diabetes, hypertension, lupus, end-stage renal disease on maintenance hemodialysis presently was brought to the emergency room because of weakness and difficulty walking.  When she was evaluated patient was found to have hypoglycemia.  Presently patient is states that she is feeling much better.  She has some chills but no fever.  Patient also denies any nausea or vomiting.  Patient states that she got her last dialysis on Saturday.  She was not able to get to the unit on Tuesday before she was feeling sick.  Presently patient denies any difficulty breathing.  She does not have any chest pain.  She was having some left-sided abdominal pain which was worked up as an outpatient.  Past Medical History:  Diagnosis Date  . Anemia   . Arthritis   . Asthma   . Atrial fibrillation ([place])   . Atrial flutter [facility])    TEE/DCCV December 2013 - on Xarelto [facility])  . Bone spur of toe    Right 5th toe  . Chronic diastolic CHF (congestive heart failure) (HCC)    a. EF 50-55% by echo in 2015  . CKD (chronic kidney disease) stage 4, GFR 15-29 ml/min (HCC)    a. s/p fistula placement but not on HD. Followed by Nephrology.   . Colon polyposis   . COPD (chronic obstructive pulmonary disease) ([place])   . DVT of upper extremity (deep vein thrombosis) ([place])    Right basilic vein, June 5366  . Essential hypertension, benign   . Fractures   . Glaucoma   . Lupus ([place])   . Pericarditis   . SLE (systemic lupus erythematosus) ([place])   . Type 2 diabetes mellitus ([place])   . UTI (lower urinary tract infection)   . Wound of right leg 06/2017    Past Surgical History:  Procedure Laterality Date  . ABDOMINAL AORTOGRAM W/LOWER EXTREMITY N/A 12/18/2017   Procedure: ABDOMINAL AORTOGRAM W/LOWER EXTREMITY;  Surgeon: Elam Dutch, MD;  Location: [place] [facility];  Service: Cardiovascular;  Laterality: N/A;  bilateral  . ABDOMINAL HYSTERECTOMY  1983  . AMPUTATION Right 06/08/2018   Procedure: RIGHT ABOVE KNEE AMPUTATION;  Surgeon: Elam Dutch, MD;  Location: [place];  Service: Vascular;  Laterality: Right;  . ANKLE SURGERY  1993  . AV FISTULA PLACEMENT Left 03/27/2014   Procedure: ARTERIOVENOUS (AV) FISTULA CREATION;  Surgeon: Rosetta Posner, MD;  Location: [place];  Service: Vascular;  Laterality: Left;  . BACK SURGERY  1980  . BASCILIC VEIN TRANSPOSITION Left 01/13/2018   Procedure: LEFT BASILIC VEIN TRANSPOSITION SECOND STAGE;  Surgeon: Elam Dutch, MD;  Location: [place];  Service: Vascular;  Laterality: Left;  . CARDIOVASCULAR STRESS TEST  12/19/2009   no stress induced rhythm abnormalities, ekg negative for ischemia  . CARDIOVERSION  09/16/2012   Procedure: CARDIOVERSION;  Surgeon: Sanda Klein, MD;  [facility];  Service: Cardiovascular;  Laterality: N/A;  . CATARACT EXTRACTION    . COLONOSCOPY  09/20/2012   Dr. [name]: multiple tubular adenomas   . COLONOSCOPY  2005   Dr. [name]. Polyps, path unknown   . COLONOSCOPY N/A 07/24/2016   Dr. [name]: 3 significant size polyps removed from the colon, tubular adenomas.  Next colonoscopy in October 2020.  Marland Kitchen ESOPHAGOGASTRODUODENOSCOPY  09/20/2012   Dr. [name]: duodenal erosion and possible resolving ulcer at the  angularis   . ESOPHAGOGASTRODUODENOSCOPY N/A 03/24/2018   Dr. [name]: Mild erosive reflux esophagitis, erosive gastropathy and enteropathy fairly extensive, no H. pylori on biopsy.  Marland Kitchen EYE SURGERY    . IR FLUORO GUIDE CV LINE RIGHT  11/09/2017  . IR US GUIDE VASC ACCESS RIGHT  11/09/2017  . REVISON OF ARTERIOVENOUS FISTULA Left 03/15/2018   Procedure: REVISION OF Left arm BASILIC VEIN TRANSPOSITION;  Surgeon: Angelia Mould, MD;  Location: [place];  Service: Vascular;  Laterality: Left;  . TEE WITHOUT CARDIOVERSION  09/16/2012   Procedure: TRANSESOPHAGEAL ECHOCARDIOGRAM  (TEE);  Surgeon: Sanda Klein, MD;  Location: [facility] END[facility]COPY;  Service: Cardiovascular;  Laterality: N/A;  . TRANSESOPHAGEAL ECHOCARDIOGRAM WITH CARDIOVERSION  09/16/2012   EF 60-65%, moderate LVH, moderate regurg of the aortic valve, LA moderately dilated    Family History  Problem Relation Age of Onset  . Diabetes Brother   . Diabetes Father   . Hypertension Father   . Hypertension Sister   . Stroke Mother   . Diabetes Sister   . Hypertension Brother   . Colon cancer Neg Hx     Social History:  reports that she quit smoking about 5 years ago. Her smoking use included cigarettes. She has a 30.00 pack-year smoking history. She has never used smokeless tobacco. She reports that she does not drink alcohol or use drugs.  Allergies:  Allergies  Allergen Reactions  . Ace Inhibitors Cough    Medications: I have reviewed the patient's current medications.  Results for orders placed or performed during the hospital encounter of 07/29/18 (from the past 48 hour(s))  CBG monitoring, ED     Status: Abnormal   Collection Time: 07/29/18 11:55 AM  Result Value Ref Range   Glucose-Capillary 44 (LL) 70 - 99 mg/dL   Comment 1 Document in Chart    Comment 2 Repeat Test    Comment 3 Call MD NNP PA CNM   CBC with Differential/Platelet     Status: Abnormal   Collection Time: 07/29/18 12:11 PM  Result Value Ref Range   WBC 7.9 4.0 - 10.5 K/uL   RBC 3.97 3.87 - 5.11 MIL/uL   Hemoglobin 10.9 (L) 12.0 - 15.0 g/dL   HCT 38.4 36.0 - 46.0 %   MCV 96.7 80.0 - 100.0 fL   MCH 27.5 26.0 - 34.0 pg   MCHC 28.4 (L) 30.0 - 36.0 g/dL   RDW 18.5 (H) 11.5 - 15.5 %   Platelets 237 150 - 400 K/uL   nRBC 0.0 0.0 - 0.2 %   Neutrophils Relative % 85 %   Neutro Abs 6.7 1.7 - 7.7 K/uL   Lymphocytes Relative 5 %   Lymphs Abs 0.4 (L) 0.7 - 4.0 K/uL   Monocytes Relative 9 %   Monocytes Absolute 0.7 0.1 - 1.0 K/uL   Eosinophils Relative 1 %   Eosinophils Absolute 0.1 0.0 - 0.5 K/uL   Basophils Relative 0 %    Basophils Absolute 0.0 0.0 - 0.1 K/uL   Immature Granulocytes 0 %   Abs Immature Granulocytes 0.03 0.00 - 0.07 K/uL    Comment: Performed at [facility], [address] [place], [place] [address]  Comprehensive metabolic panel     Status: Abnormal   Collection Time: 07/29/18 12:11 PM  Result Value Ref Range   Sodium 134 (L) 135 - 145 mmol/L   Potassium 4.5 3.5 - 5.1 mmol/L   Chloride 100 98 - 111 mmol/L   CO2 19 (L) 22 -  32 mmol/L   Glucose, Bld 96 70 - 99 mg/dL   BUN 123 (H) 8 - 23 mg/dL    Comment: RESULTS CONFIRMED BY MANUAL DILUTION   Creatinine, Ser 7.81 (H) 0.44 - 1.00 mg/dL   Calcium 8.4 (L) 8.9 - 10.3 mg/dL   Total Protein 6.5 6.5 - 8.1 g/dL   Albumin 2.3 (L) 3.5 - 5.0 g/dL   AST 18 15 - 41 U/L   ALT 11 0 - 44 U/L   Alkaline Phosphatase 59 38 - 126 U/L   Total Bilirubin 0.5 0.3 - 1.2 mg/dL   GFR calc non Af Amer 5 (L) >60 mL/min   GFR calc Af Amer 5 (L) >60 mL/min    Comment: (NOTE) The eGFR has been calculated using the CKD EPI equation. This calculation has not been validated in all clinical situations. eGFR's persistently <60 mL/min signify possible Chronic Kidney Disease.    Anion gap 15 5 - 15    Comment: Performed at [facility], [address], [place], [place] [address]  CBG monitoring, ED     Status: Abnormal   Collection Time: 07/29/18  1:27 PM  Result Value Ref Range   Glucose-Capillary 55 (L) 70 - 99 mg/dL  CBG monitoring, ED     Status: Abnormal   Collection Time: 07/29/18  3:11 PM  Result Value Ref Range   Glucose-Capillary 114 (H) 70 - 99 mg/dL    Ct Abdomen Pelvis W Contrast  Result Date: 07/28/2018 CLINICAL DATA:  72 year old female with RIGHT abdominal and pelvic pain for 3 months. History of chronic kidney disease. EXAM: CT ABDOMEN AND PELVIS WITH CONTRAST TECHNIQUE: Multidetector CT imaging of the abdomen and pelvis was performed using the standard protocol following bolus administration of intravenous contrast. CONTRAST:  165m ISOVUE-300  IOPAMIDOL (ISOVUE-300) INJECTION 61% COMPARISON:  11/02/2017 CT FINDINGS: Lower chest: No acute abnormality. Cardiomegaly again noted. Coronary artery and aortic atherosclerotic calcifications again identified. Hepatobiliary: The liver and gallbladder are unremarkable except for small hepatic cysts. No biliary dilatation. Pancreas: Unremarkable Spleen: Unremarkable Adrenals/Urinary Tract: A 3 cm RIGHT UPPER pole mass is suspicious for renal cell carcinoma. No other renal abnormalities noted. No definite renal vein thrombosis. The adrenal glands and bladder are unremarkable. Stomach/Bowel: Equivocal mild circumferential wall thickening of the RIGHT colon noted which could represent a colitis. No evidence of bowel obstruction. No other bowel wall thickening identified. The appendix is normal. Vascular/Lymphatic: Aortic atherosclerosis. No enlarged abdominal or pelvic lymph nodes. Reproductive: Status post hysterectomy. No adnexal masses. Other: No ascites, focal collection or pneumoperitoneum. Musculoskeletal: No acute bony abnormalities are identified. Degenerative changes within the lumbar spine noted. IMPRESSION: 1. Equivocal mild circumferential wall thickening of the RIGHT: Which may represent colitis. No evidence of bowel obstruction, pneumoperitoneum or abscess. 2. 3 cm RIGHT UPPER pole renal mass again identified, suspicious for renal cell carcinoma. No enlarged lymph nodes, RIGHT adrenal abnormality or venous thrombosis. 3. Cardiomegaly 4. Coronary artery and Aortic Atherosclerosis (ICD10-I70.0). Electronically Signed   By: JMargarette CanadaM.D.   On: 07/28/2018 17:26    Review of Systems  Constitutional: Positive for chills and malaise/fatigue. Negative for fever.  Respiratory: Negative for cough and shortness of breath.   Cardiovascular: Negative for chest pain, orthopnea and PND.  Gastrointestinal: Positive for abdominal pain. Negative for nausea and vomiting.   Blood pressure 135/65, pulse 68,  temperature 97.7 F (36.5 C), temperature source Oral, resp. rate 14, height '5\' 5"'  (1.651 m), weight 62.1 kg, SpO2 100 %. Physical Exam  Constitutional: No distress.  Neck: JVD present.  Cardiovascular: Normal rate and regular rhythm.  Respiratory: No respiratory distress. She has no wheezes.  GI: She exhibits no distension. There is no tenderness.  Musculoskeletal:  She has dry gangrene of her toes on the left leg    Assessment/Plan: 1] end-stage renal disease: She is status post hemodialysis on Saturday.  She missed his dialysis on Tuesday and today was not able to go there.  Presently she does not seem to have uremic signs and symptoms.  Potassium is normal. 2] history of diabetes: Patient came with hypoglycemia.  She is feeling somewhat better. 3] hypertension: Her blood pressure is reasonably controlled 4] anemia: Her hemoglobin is within our target goal 5] bone and mineral disorder: Her calcium and phosphorus is range 6] history of peripheral vascular disease 7] history of COPD Plan:1] We will make arrangement for patient to get dialysis for 3-1/2 hours today 2]We will try to remove about 2 L if systolic blood pressure remains above 90 3]We will check a renal panel and CBC in the morning.  Juanluis Guastella S 07/29/2018, 3:39 PM

## 2018-07-29 NOTE — ED Provider Notes (Signed)
[facility] EMERGENCY DEPARTMENT Provider Note   CSN: [id number] Arrival date & time: 07/29/18  1026     History   Chief Complaint Chief Complaint  Patient presents with  . Weakness  . Hypoglycemia    HPI [name] is a 72 y.o. female.  HPI Patient took her insulin this morning but did not eat breakfast.  States she was having some generalized weakness and checked her blood sugar.  It was a 55 by EMS report.  Patient states she is feeling better after some oral intake.  She missed dialysis on Tuesday and is scheduled for dialysis today.  She denies any shortness of breath.  She had no fever or chills.  She does have chronic left foot gangrene.  She is being followed by vascular surgery for this who have recommended amputation.  No acute changes noted by patient or family. Past Medical History:  Diagnosis Date  . Anemia   . Arthritis   . Asthma   . Atrial fibrillation ([place])   . Atrial flutter [facility])    TEE/DCCV December 2013 - on [facility] [address])  . Bone spur of toe    Right 5th toe  . Chronic diastolic CHF (congestive heart failure) (HCC)    a. EF 50-55% by echo in 2015  . CKD (chronic kidney disease) stage 4, GFR 15-29 ml/min (HCC)    a. s/p fistula placement but not on HD. Followed by Nephrology.   . Colon polyposis   . COPD (chronic obstructive pulmonary disease) ([place])   . DVT of upper extremity (deep vein thrombosis) ([place])    Right basilic vein, June 9678  . Essential hypertension, benign   . Fractures   . Glaucoma   . Lupus ([place])   . Pericarditis   . SLE (systemic lupus erythematosus) ([place])   . Type 2 diabetes mellitus ([place])   . UTI (lower urinary tract infection)   . Wound of right leg 06/2017    Patient Active Problem List   Diagnosis Date Noted  . Trichomonal vaginitis 07/12/2018  . Atrial flutter ([place])   . SVT (supraventricular tachycardia) ([place])   . ESRD on dialysis ([place])   . Steroid-induced hyperglycemia   . Type II diabetes mellitus with  peripheral autonomic neuropathy (HCC)   . Anemia of chronic disease   . Prediabetes   . Postoperative pain   . Ischemic leg pain   . Unilateral AKA, right ([place])   . Post-operative pain   . Diabetic retinopathy associated with type 2 diabetes mellitus ([place])   . Diabetic nephropathy associated with type 2 diabetes mellitus ([place])   . Leukocytosis   . Non-healing wound of lower extremity 06/08/2018  . Malnutrition of moderate degree 03/03/2018  . Leg wound, right 03/01/2018  . Ulcer of right calf ([place]) 02/10/2018  . Chronic RLQ pain 01/11/2018  . Ischemic ulcer of toes on both feet ([place])   . Cellulitis in diabetic foot ([place]) 12/14/2017  . Atrial fibrillation ([place]) 12/14/2017  . Glaucoma 12/14/2017  . ESRD (end stage renal disease) on dialysis ([place]) 11/16/2017  . COPD with acute exacerbation ([place]) 11/03/2017  . Community acquired pneumonia 11/02/2017  . Nausea and vomiting 11/02/2017  . Iron deficiency anemia 08/07/2017  . Heme positive stool 10/04/2015  . Mixed hyperlipidemia 06/29/2015  . Type 2 diabetes mellitus with stage 4 chronic kidney disease, with long-term current use of insulin ([place]) 06/29/2015  . Chronic kidney disease, stage V ([place]) 04/12/2014  . Focal segmental glomerulosclerosis without nephrosis  or chronic glomerulonephritis 04/06/2014  . Acute respiratory failure with hypoxia ([place]) 03/15/2014  . Chronic diastolic heart failure ([place]) 03/15/2014  . Anemia of chronic renal failure, stage 4 (severe) ([place]) 02/23/2014  . Folate deficiency 02/17/2014  . Aortic regurgitation 09/19/2012  . Class 2 severe obesity due to excess calories with serious comorbidity and body mass index (BMI) of 35.0 to 35.9 in adult ([place]) 09/14/2012  . Type 2 diabetes mellitus with diabetic neuropathy ([place]) 09/14/2012  . SLE (systemic lupus erythematosus) ([place]) 09/14/2012  . Essential hypertension, benign 09/14/2012  . COPD (chronic obstructive pulmonary disease) ([place]) 09/14/2012    Past Surgical  History:  Procedure Laterality Date  . ABDOMINAL AORTOGRAM W/LOWER EXTREMITY N/A 12/18/2017   Procedure: ABDOMINAL AORTOGRAM W/LOWER EXTREMITY;  Surgeon: Elam Dutch, MD;  Location: [place] [facility];  Service: Cardiovascular;  Laterality: N/A;  bilateral  . ABDOMINAL HYSTERECTOMY  1983  . AMPUTATION Right 06/08/2018   Procedure: RIGHT ABOVE KNEE AMPUTATION;  Surgeon: Elam Dutch, MD;  Location: [place];  Service: Vascular;  Laterality: Right;  . ANKLE SURGERY  1993  . AV FISTULA PLACEMENT Left 03/27/2014   Procedure: ARTERIOVENOUS (AV) FISTULA CREATION;  Surgeon: Rosetta Posner, MD;  Location: [place];  Service: Vascular;  Laterality: Left;  . BACK SURGERY  1980  . BASCILIC VEIN TRANSPOSITION Left 01/13/2018   Procedure: LEFT BASILIC VEIN TRANSPOSITION SECOND STAGE;  Surgeon: Elam Dutch, MD;  Location: [place];  Service: Vascular;  Laterality: Left;  . CARDIOVASCULAR STRESS TEST  12/19/2009   no stress induced rhythm abnormalities, ekg negative for ischemia  . CARDIOVERSION  09/16/2012   Procedure: CARDIOVERSION;  Surgeon: Sanda Klein, MD;  Location: [facility];  Service: Cardiovascular;  Laterality: N/A;  . CATARACT EXTRACTION    . COLONOSCOPY  09/20/2012   Dr. [name]: multiple tubular adenomas   . COLONOSCOPY  2005   Dr. [name]. Polyps, path unknown   . COLONOSCOPY N/A 07/24/2016   Dr. [name]: 3 significant size polyps removed from the colon, tubular adenomas.  Next colonoscopy in October 2020.  [facility] Kitchen ESOPHAGOGASTRODUODENOSCOPY  09/20/2012   Dr. [name]: duodenal erosion and possible resolving ulcer at the angularis   . ESOPHAGOGASTRODUODENOSCOPY N/A 03/24/2018   Dr. [name]: Mild erosive reflux esophagitis, erosive gastropathy and enteropathy fairly extensive, no H. pylori on biopsy.  Marland Kitchen EYE SURGERY    . IR FLUORO GUIDE CV LINE RIGHT  11/09/2017  . IR US GUIDE VASC ACCESS RIGHT  11/09/2017  . REVISON OF ARTERIOVENOUS FISTULA Left 03/15/2018   Procedure: REVISION OF Left arm  BASILIC VEIN TRANSPOSITION;  Surgeon: Angelia Mould, MD;  Location: [place];  Service: Vascular;  Laterality: Left;  . TEE WITHOUT CARDIOVERSION  09/16/2012   Procedure: TRANSESOPHAGEAL ECHOCARDIOGRAM (TEE);  Surgeon: Sanda Klein, MD;  Location[facility] END[facility]COPY;  Service: Cardiovascular;  Laterality: N/A;  . TRANSESOPHAGEAL ECHOCARDIOGRAM WITH CARDIOVERSION  09/16/2012   EF 60-65%, moderate LVH, moderate regurg of the aortic valve, LA moderately dilated     OB History    Gravida  0   Para  0   Term  0   Preterm  0   AB  0   Living  0     SAB  0   TAB  0   Ectopic  0   Multiple  0   Live Births  0            Home Medications    Prior to Admission medications   Medication Sig  Start Date End Date Taking? Authorizing Provider  acetaminophen (TYLENOL) 325 MG tablet Take 1-2 tablets (325-650 mg total) by mouth every 4 (four) hours as needed for mild pain. Patient taking differently: Take 1,000 mg by mouth daily as needed for mild pain or moderate pain.  06/24/18  Yes Love, Ivan Anchors, PA-C  albuterol (PROVENTIL HFA;VENTOLIN HFA) 108 (90 BASE) MCG/ACT inhaler Inhale 2 puffs into the lungs every 6 (six) hours as needed for shortness of breath.    Yes [provider]  allopurinol (ZYLOPRIM) 100 MG tablet Take 100 mg by mouth every Monday, Wednesday, and Friday.    Yes [provider]  Amino Acids-Protein Hydrolys (FEEDING SUPPLEMENT, PRO-STAT SUGAR FREE 64,) LIQD Take 30 mLs by mouth 2 (two) times daily. 12/29/17  Yes Danford, Suann Larry, MD  amiodarone (PACERONE) 200 MG tablet Take 200 mg twice a day for 2 weeks, THEN take 200 mg daily thereafter Patient taking differently: Take 200 mg by mouth daily. Take 200 mg twice a day for 2 weeks, THEN take 200 mg daily thereafter 07/05/18  Yes Satira Sark, MD  apixaban (ELIQUIS) 5 MG TABS tablet Take 1 tablet (5 mg total) by mouth 2 (two) times daily. 12/29/17  Yes Danford, Suann Larry, MD  atorvastatin  (LIPITOR) 20 MG tablet Take 20 mg by mouth at bedtime.    Yes [provider]  B Complex-C-Folic Acid (DIALYVITE 009) 0.8 MG TABS Take 1 tablet by mouth daily.   Yes [provider]  brimonidine-timolol (COMBIGAN) 0.2-0.5 % ophthalmic solution Place 1 drop into the left eye 2 (two) times daily.    Yes [provider]  calcium acetate (PHOSLO) 667 MG capsule Take 667 mg by mouth 3 (three) times daily with meals.  07/01/18  Yes [provider]  fentaNYL (DURAGESIC - DOSED MCG/HR) 50 MCG/HR Place 25 mcg onto the skin every 3 (three) days.    Yes [provider]  folic acid (FOLVITE) 1 MG tablet Take 1 mg by mouth daily.   Yes [provider]  hydroxychloroquine (PLAQUENIL) 200 MG tablet Take 200 mg by mouth daily.    Yes [provider]  Insulin Detemir (LEVEMIR FLEXTOUCH) 100 UNIT/ML Pen Inject 5 Units into the skin every morning. 06/24/18  Yes Love, Pamela S, PA-C  insulin lispro (HUMALOG KWIKPEN) 100 UNIT/ML KiwkPen Inject 5 Units into the skin 3 (three) times daily with meals as needed (*Only take if blood sugar levels are over 100).    Yes [provider]  metoprolol tartrate (LOPRESSOR) 25 MG tablet Take 1/2 tablet twice a day on Mondays, Wednesdays and Fridays Patient taking differently: Take 25 mg by mouth See admin instructions. Take 12.5 mg twice a day on Mondays, Wednesdays and Fridays 06/24/18  Yes Love, Ivan Anchors, PA-C  oxyCODONE-acetaminophen (PERCOCET/ROXICET) 5-325 MG tablet Take 1 tablet by mouth every 6 (six) hours as needed for severe pain. Patient taking differently: Take 1 tablet by mouth 2 (two) times daily.  06/24/18  Yes Love, Ivan Anchors, PA-C  pantoprazole (PROTONIX) 40 MG tablet Take 1 tablet (40 mg total) by mouth daily. 03/29/18  Yes Rourk, [name], MD  predniSONE (DELTASONE) 5 MG tablet Take 5 mg by mouth daily with breakfast.   Yes [provider]  travoprost, benzalkonium, (TRAVATAN) 0.004 % ophthalmic  solution Place 1 drop into both eyes at bedtime.    Yes [provider]  metroNIDAZOLE (FLAGYL) 500 MG tablet Take 1 tablet (500 mg total) by mouth 2 (  two) times daily. For patient and partner, (expedited partner therapy) Patient not taking: Reported on 07/29/2018 07/12/18   Jonnie Kind, MD    Family History Family History  Problem Relation Age of Onset  . Diabetes Brother   . Diabetes Father   . Hypertension Father   . Hypertension Sister   . Stroke Mother   . Diabetes Sister   . Hypertension Brother   . Colon cancer Neg Hx     Social History Social History   Tobacco Use  . Smoking status: Former Smoker    Packs/day: 1.00    Years: 30.00    Pack years: 30.00    Types: Cigarettes    Last attempt to quit: 08/29/2012    Years since quitting: 5.9  . Smokeless tobacco: Never Used  . Tobacco comment: quit 08-2012  Substance Use Topics  . Alcohol use: No    Alcohol/week: 0.0 standard drinks  . Drug use: No     Allergies   Ace inhibitors   Review of Systems Review of Systems  Constitutional: Positive for fatigue. Negative for chills and fever.  HENT: Negative for trouble swallowing.   Eyes: Negative for visual disturbance.  Respiratory: Negative for cough and shortness of breath.   Cardiovascular: Negative for chest pain.  Gastrointestinal: Negative for abdominal pain, constipation, diarrhea, nausea and vomiting.  Musculoskeletal: Negative for back pain, myalgias and neck pain.  Skin: Negative for rash and wound.  Neurological: Positive for weakness. Negative for dizziness, light-headedness and headaches.  All other systems reviewed and are negative.    Physical Exam Updated Vital Signs BP (!) 150/69   Pulse 68   Temp 97.7 F (36.5 C) (Oral)   Resp 16   Ht 5\' 5"  (1.651 m)   Wt 62.1 kg   SpO2 100%   BMI 22.80 kg/m   Physical Exam  Constitutional: She is oriented to person, place, and time. She appears well-developed and well-nourished.    HENT:  Head: Normocephalic and atraumatic.  Mouth/Throat: Oropharynx is clear and moist.  Eyes: Pupils are equal, round, and reactive to light. EOM are normal.  Neck: Normal range of motion. Neck supple.  Cardiovascular: Normal rate and regular rhythm. Exam reveals no gallop and no friction rub.  No murmur heard. Pulmonary/Chest: Effort normal and breath sounds normal. No stridor. No respiratory distress. She has no wheezes. She has no rales. She exhibits no tenderness.  Abdominal: Soft. Bowel sounds are normal. There is no tenderness. There is no rebound and no guarding.  Musculoskeletal: Normal range of motion. She exhibits no edema or tenderness.  Right above-the-knee amputation.  Scar appears well-healing.  2 staples are still in place.  No obvious erythema warmth or fluctuance.  All of the toes of the left foot are gangrenous.  No foul smell, erythema or warmth just proximal.   Lymphadenopathy:    She has no cervical adenopathy.  Neurological: She is alert and oriented to person, place, and time.  Moves all extremities without focal deficit  Skin: Skin is warm and dry. No rash noted. No erythema.  Psychiatric: She has a normal mood and affect. Her behavior is normal.  Nursing note and vitals reviewed.    ED Treatments / Results  Labs (all labs ordered are listed, but only abnormal results are displayed) Labs Reviewed  CBC WITH DIFFERENTIAL/PLATELET - Abnormal; Notable for the following components:      Result Value   Hemoglobin 10.9 (*)    MCHC 28.4 (*)  RDW 18.5 (*)    Lymphs Abs 0.4 (*)    All other components within normal limits  COMPREHENSIVE METABOLIC PANEL - Abnormal; Notable for the following components:   Sodium 134 (*)    CO2 19 (*)    BUN 123 (*)    Creatinine, Ser 7.81 (*)    Calcium 8.4 (*)    Albumin 2.3 (*)    GFR calc non Af Amer 5 (*)    GFR calc Af Amer 5 (*)    All other components within normal limits  CBG MONITORING, ED - Abnormal; Notable for  the following components:   Glucose-Capillary 44 (*)    All other components within normal limits  CBG MONITORING, ED - Abnormal; Notable for the following components:   Glucose-Capillary 55 (*)    All other components within normal limits  URINALYSIS, ROUTINE W REFLEX MICROSCOPIC  CBG MONITORING, ED    EKG EKG Interpretation  Date/Time:  Thursday July 29 2018 10:39:53 EDT Ventricular Rate:  68 PR Interval:    QRS Duration: 99 QT Interval:  454 QTC Calculation: 483 R Axis:   -6 Text Interpretation:  Sinus rhythm Atrial premature complex Left atrial enlargement Left ventricular hypertrophy Confirmed by Julianne Rice [phone]) on 07/29/2018 11:09:43 AM   Radiology Ct Abdomen Pelvis W Contrast  Result Date: 07/28/2018 CLINICAL DATA:  72 year old female with RIGHT abdominal and pelvic pain for 3 months. History of chronic kidney disease. EXAM: CT ABDOMEN AND PELVIS WITH CONTRAST TECHNIQUE: Multidetector CT imaging of the abdomen and pelvis was performed using the standard protocol following bolus administration of intravenous contrast. CONTRAST:  125mL ISOVUE-300 IOPAMIDOL (ISOVUE-300) INJECTION 61% COMPARISON:  11/02/2017 CT FINDINGS: Lower chest: No acute abnormality. Cardiomegaly again noted. Coronary artery and aortic atherosclerotic calcifications again identified. Hepatobiliary: The liver and gallbladder are unremarkable except for small hepatic cysts. No biliary dilatation. Pancreas: Unremarkable Spleen: Unremarkable Adrenals/Urinary Tract: A 3 cm RIGHT UPPER pole mass is suspicious for renal cell carcinoma. No other renal abnormalities noted. No definite renal vein thrombosis. The adrenal glands and bladder are unremarkable. Stomach/Bowel: Equivocal mild circumferential wall thickening of the RIGHT colon noted which could represent a colitis. No evidence of bowel obstruction. No other bowel wall thickening identified. The appendix is normal. Vascular/Lymphatic: Aortic  atherosclerosis. No enlarged abdominal or pelvic lymph nodes. Reproductive: Status post hysterectomy. No adnexal masses. Other: No ascites, focal collection or pneumoperitoneum. Musculoskeletal: No acute bony abnormalities are identified. Degenerative changes within the lumbar spine noted. IMPRESSION: 1. Equivocal mild circumferential wall thickening of the RIGHT: Which may represent colitis. No evidence of bowel obstruction, pneumoperitoneum or abscess. 2. 3 cm RIGHT UPPER pole renal mass again identified, suspicious for renal cell carcinoma. No enlarged lymph nodes, RIGHT adrenal abnormality or venous thrombosis. 3. Cardiomegaly 4. Coronary artery and Aortic Atherosclerosis (ICD10-I70.0). Electronically Signed   By: Margarette Canada M.D.   On: 07/28/2018 17:26    Procedures Procedures (including critical care time)  Medications Ordered in ED Medications  dextrose 10 % infusion ( Intravenous New Bag/Given 07/29/18 1412)  dextrose 50 % solution 50 mL (50 mLs Intravenous Given 07/29/18 1204)  dextrose 50 % solution 50 mL (50 mLs Intravenous Given 07/29/18 1408)    CRITICAL CARE Performed by: Julianne Rice Total critical care time: 30 minutes Critical care time was exclusive of separately billable procedures and treating other patients. Critical care was necessary to treat or prevent imminent or life-threatening deterioration. Critical care was time spent personally by me on the following activities:  development of treatment plan with patient and/or surrogate as well as nursing, discussions with consultants, evaluation of patient's response to treatment, examination of patient, obtaining history from patient or surrogate, ordering and performing treatments and interventions, ordering and review of laboratory studies, ordering and review of radiographic studies, pulse oximetry and re-evaluation of patient's condition. Initial Impression / Assessment and Plan / ED Course  I have reviewed the triage  vital signs and the nursing notes.  Pertinent labs & imaging results that were available during my care of the patient were reviewed by me and considered in my medical decision making (see chart for details).    Difficulty maintaining normal blood sugar even after D50 bolus and feeding patient.  Discussed with hospitalist.  Will admit.  We will also speak with nephrology regarding dialysis.  Patient's foot appears to be gangrenous.  No obvious secondary infection.  Normal white blood cell count.  She is being followed by vascular surgery for this.   Final Clinical Impressions(s) / ED Diagnoses   Final diagnoses:  Hypoglycemia due to insulin  Gangrene of left foot [facility])    ED Discharge Orders    None       Julianne Rice, MD 07/29/18 [phone]

## 2018-07-29 NOTE — H&P (Signed)
Patient Demographics:    [name], is a 72 y.o. female  MRN: [id number]   DOB - 04-Jul-1946  Admit Date - 07/29/2018  Outpatient Primary MD for the patient is Sharilyn Sites, MD   Assessment & Plan:    Principal Problem:   Hypoglycemia Active Problems:   Type 2 diabetes mellitus with diabetic neuropathy ([place])   SLE (systemic lupus erythematosus) ([place])   Essential hypertension, benign   ESRD (end stage renal disease) on dialysis [facility])   Atrial fibrillation ([place])   Diabetic nephropathy associated with type 2 diabetes mellitus ([place])    1)Persistent Hypoglycemia--due to insulin stacking in the setting of mixed multiple sessions of hemodialysis and poor oral intake, despite IV dextrose solution hypoglycemia persist nephrologist will initiate hemodialysis on 07/29/2018, without hemodialysis patient is at risk for volume overload from continuous infusion of hypertonic dextrose solution  2)ESRD on HD T/T/S last hemodialysis session on 07/24/2018---nephrologist will initiate hemodialysis on 07/29/2018,  3)Chronic Atrial Fibrillation/History of DVT --- continue apixaban for anticoagulation, continue amiodarone and metoprolol rate control  4)History of Lupus----stable, continue prednisone 5 mg daily give stress dose steroids , continue Plaquenil and folic acid, fentanyl patch for chronic pain  5)Anemia of ESRD----stable, EPO agent per nephrologist  With History of - Reviewed by me  Past Medical History:  Diagnosis Date  . Anemia   . Arthritis   . Asthma   . Atrial fibrillation ([place])   . Atrial flutter [facility])    TEE/DCCV December 2013 - on Xarelto [facility])  . Bone spur of toe    Right 5th toe  . Chronic diastolic CHF (congestive heart failure) (HCC)    a. EF 50-55% by echo in 2015  . CKD (chronic kidney  disease) stage 4, GFR 15-29 ml/min (HCC)    a. s/p fistula placement but not on HD. Followed by Nephrology.   . Colon polyposis   . COPD (chronic obstructive pulmonary disease) ([place])   . DVT of upper extremity (deep vein thrombosis) ([place])    Right basilic vein, June 0102  . Essential hypertension, benign   . Fractures   . Glaucoma   . Lupus ([place])   . Pericarditis   . SLE (systemic lupus erythematosus) ([place])   . Type 2 diabetes mellitus ([place])   . UTI (lower urinary tract infection)   . Wound of right leg 06/2017      Past Surgical History:  Procedure Laterality Date  . ABDOMINAL AORTOGRAM W/LOWER EXTREMITY N/A 12/18/2017   Procedure: ABDOMINAL AORTOGRAM W/LOWER EXTREMITY;  Surgeon: Elam Dutch, MD;  Location: [place] [facility];  Service: Cardiovascular;  Laterality: N/A;  bilateral  . ABDOMINAL HYSTERECTOMY  1983  . AMPUTATION Right 06/08/2018   Procedure: RIGHT ABOVE KNEE AMPUTATION;  Surgeon: Elam Dutch, MD;  Location: [place];  Service: Vascular;  Laterality: Right;  . ANKLE SURGERY  1993  . AV FISTULA PLACEMENT Left 03/27/2014   Procedure: ARTERIOVENOUS (AV) FISTULA  CREATION;  Surgeon: Rosetta Posner, MD;  Location: [place];  Service: Vascular;  Laterality: Left;  . BACK SURGERY  1980  . BASCILIC VEIN TRANSPOSITION Left 01/13/2018   Procedure: LEFT BASILIC VEIN TRANSPOSITION SECOND STAGE;  Surgeon: Elam Dutch, MD;  Location: [place];  Service: Vascular;  Laterality: Left;  . CARDIOVASCULAR STRESS TEST  12/19/2009   no stress induced rhythm abnormalities, ekg negative for ischemia  . CARDIOVERSION  09/16/2012   Procedure: CARDIOVERSION;  Surgeon: Sanda Klein, MD;  [facility];  Service: Cardiovascular;  Laterality: N/A;  . CATARACT EXTRACTION    . COLONOSCOPY  09/20/2012   Dr. [name]: multiple tubular adenomas   . COLONOSCOPY  2005   Dr. [name]. Polyps, path unknown   . COLONOSCOPY N/A 07/24/2016   Dr. [name]: 3 significant size polyps removed from the  colon, tubular adenomas.  Next colonoscopy in October 2020.  Marland Kitchen ESOPHAGOGASTRODUODENOSCOPY  09/20/2012   Dr. [name]: duodenal erosion and possible resolving ulcer at the angularis   . ESOPHAGOGASTRODUODENOSCOPY N/A 03/24/2018   Dr. [name]: Mild erosive reflux esophagitis, erosive gastropathy and enteropathy fairly extensive, no H. pylori on biopsy.  Marland Kitchen EYE SURGERY    . IR FLUORO GUIDE CV LINE RIGHT  11/09/2017  . IR US GUIDE VASC ACCESS RIGHT  11/09/2017  . REVISON OF ARTERIOVENOUS FISTULA Left 03/15/2018   Procedure: REVISION OF Left arm BASILIC VEIN TRANSPOSITION;  Surgeon: Angelia Mould, MD;  Location: [place];  Service: Vascular;  Laterality: Left;  . TEE WITHOUT CARDIOVERSION  09/16/2012   Procedure: TRANSESOPHAGEAL ECHOCARDIOGRAM (TEE);  Surgeon: Sanda Klein, MD;  Location: [facility] END[facility]COPY;  Service: Cardiovascular;  Laterality: N/A;  . TRANSESOPHAGEAL ECHOCARDIOGRAM WITH CARDIOVERSION  09/16/2012   EF 60-65%, moderate LVH, moderate regurg of the aortic valve, LA moderately dilated    Chief Complaint  Patient presents with  . Weakness  . Hypoglycemia      HPI:    [name]  is a 72 y.o. female with Pmhx of DM2, HTN, Lupus, ESRD on HD T/T/S last hemodialysis session on 07/24/2018..... Now presents with generalized weakness and difficulty walking...Marland KitchenMarland KitchenMarland Kitchen patient has sluggishness, fatigue, no significant confusion, no vomiting or diarrhea, no fevers no chills  In ED... She is found to be persistently hypoglycemic with glucose in the 40s and 50s, significant order she is oral intake has been poor patient has been taking insulin as prescribed suspect insulin stacking in the absence of hemodialysis for almost a week........  No chest pains no palpitations no dizziness no shortness of breath  EDP.. Initiated IV dextrose solution....   Discussed with on-call nephrologist to initiate HD on 07/29/2018    Review of systems:    In addition to the HPI above,   A full Review of   Systems was done, all other systems reviewed are negative except as noted above in HPI , .    Social History:  Reviewed by me    Social History   Tobacco Use  . Smoking status: Former Smoker    Packs/day: 1.00    Years: 30.00    Pack years: 30.00    Types: Cigarettes    Last attempt to quit: 08/29/2012    Years since quitting: 5.9  . Smokeless tobacco: Never Used  . Tobacco comment: quit 08-2012  Substance Use Topics  . Alcohol use: No    Alcohol/week: 0.0 standard drinks     Family History :  Reviewed by me    Family History  Problem Relation  Age of Onset  . Diabetes Brother   . Diabetes Father   . Hypertension Father   . Hypertension Sister   . Stroke Mother   . Diabetes Sister   . Hypertension Brother   . Colon cancer Neg Hx     Home Medications:   Prior to Admission medications   Medication Sig Start Date End Date Taking? Authorizing Provider  acetaminophen (TYLENOL) 325 MG tablet Take 1-2 tablets (325-650 mg total) by mouth every 4 (four) hours as needed for mild pain. Patient taking differently: Take 1,000 mg by mouth daily as needed for mild pain or moderate pain.  06/24/18  Yes Love, Ivan Anchors, PA-C  albuterol (PROVENTIL HFA;VENTOLIN HFA) 108 (90 BASE) MCG/ACT inhaler Inhale 2 puffs into the lungs every 6 (Little) hours as needed for shortness of breath.    Yes [provider]  allopurinol (ZYLOPRIM) 100 MG tablet Take 100 mg by mouth every Monday, Wednesday, and Friday.    Yes [provider]  Amino Acids-Protein Hydrolys (FEEDING SUPPLEMENT, PRO-STAT SUGAR FREE 64,) LIQD Take 30 mLs by mouth 2 (two) times daily. 12/29/17  Yes Danford, Suann Larry, MD  amiodarone (PACERONE) 200 MG tablet Take 200 mg twice a day for 2 weeks, THEN take 200 mg daily thereafter Patient taking differently: Take 200 mg by mouth daily. Take 200 mg twice a day for 2 weeks, THEN take 200 mg daily thereafter 07/05/18  Yes Satira Sark, MD  apixaban (ELIQUIS) 5 MG TABS  tablet Take 1 tablet (5 mg total) by mouth 2 (two) times daily. 12/29/17  Yes Danford, Suann Larry, MD  atorvastatin (LIPITOR) 20 MG tablet Take 20 mg by mouth at bedtime.    Yes [provider]  B Complex-C-Folic Acid (DIALYVITE 062) 0.8 MG TABS Take 1 tablet by mouth daily.   Yes [provider]  brimonidine-timolol (COMBIGAN) 0.2-0.5 % ophthalmic solution Place 1 drop into the left eye 2 (two) times daily.    Yes [provider]  calcium acetate (PHOSLO) 667 MG capsule Take 667 mg by mouth 3 (three) times daily with meals.  07/01/18  Yes [provider]  fentaNYL (DURAGESIC - DOSED MCG/HR) 50 MCG/HR Place 25 mcg onto the skin every 3 (three) days.    Yes [provider]  folic acid (FOLVITE) 1 MG tablet Take 1 mg by mouth daily.   Yes [provider]  hydroxychloroquine (PLAQUENIL) 200 MG tablet Take 200 mg by mouth daily.    Yes [provider]  Insulin Detemir (LEVEMIR FLEXTOUCH) 100 UNIT/ML Pen Inject 5 Units into the skin every morning. 06/24/18  Yes Love, Pamela S, PA-C  insulin lispro (HUMALOG KWIKPEN) 100 UNIT/ML KiwkPen Inject 5 Units into the skin 3 (three) times daily with meals as needed (*Only take if blood sugar levels are over 100).    Yes [provider]  metoprolol tartrate (LOPRESSOR) 25 MG tablet Take 1/2 tablet twice a day on Mondays, Wednesdays and Fridays Patient taking differently: Take 25 mg by mouth See admin instructions. Take 12.5 mg twice a day on Mondays, Wednesdays and Fridays 06/24/18  Yes Love, Ivan Anchors, PA-C  oxyCODONE-acetaminophen (PERCOCET/ROXICET) 5-325 MG tablet Take 1 tablet by mouth every 6 (Little) hours as needed for severe pain. Patient taking differently: Take 1 tablet by mouth 2 (two) times daily.  06/24/18  Yes Love, Ivan Anchors, PA-C  pantoprazole (PROTONIX) 40 MG tablet Take 1 tablet (40 mg total) by mouth daily. 03/29/18  Yes Rourk, [name],  MD  predniSONE (DELTASONE) 5 MG tablet Take 5 mg  by mouth daily with breakfast.   Yes [provider]  travoprost, benzalkonium, (TRAVATAN) 0.004 % ophthalmic solution Place 1 drop into both eyes at bedtime.    Yes [provider]  metroNIDAZOLE (FLAGYL) 500 MG tablet Take 1 tablet (500 mg total) by mouth 2 (two) times daily. For patient and partner, (expedited partner therapy) Patient not taking: Reported on 07/29/2018 07/12/18   Jonnie Kind, MD     Allergies:     Allergies  Allergen Reactions  . Ace Inhibitors Cough     Physical Exam:   Vitals  Blood pressure (!) 146/51, pulse 66, temperature 97.7 F (36.5 C), temperature source Oral, resp. rate 16, height 5\' 5"  (1.651 m), weight 68.2 kg, SpO2 100 %.  Physical Examination: General appearance - alert, well appearing, and in no distress  Mental status - alert, oriented to person, place, and time,  Eyes - sclera anicteric Neck - supple, no JVD elevation , hemodialysis tunneled catheter site is clean dry and intact Chest - clear  to auscultation bilaterally, symmetrical air movement,  Heart - S1 and S2 normal, regular Abdomen - soft, nontender, nondistended, no masses or organomegaly, no CVA tenderness Neurological -generalized weakness without new focal deficits Extremities -right AKA, left foot with significant , extensive area of dry gangrene, left arm AV fistula appears to be nonfunctional Skin - warm, dry    Data Review:    CBC Recent Labs  Lab 07/29/18 1211  WBC 7.9  HGB 10.9*  HCT 38.4  PLT 237  MCV 96.7  MCH 27.5  MCHC 28.4*  RDW 18.5*  LYMPHSABS 0.4*  MONOABS 0.7  EOSABS 0.1  BASOSABS 0.0   ------------------------------------------------------------------------------------------------------------------  Chemistries  Recent Labs  Lab 07/29/18 1211  NA 134*  K 4.5  CL 100  CO2 19*  GLUCOSE 96  BUN 123*  CREATININE 7.81*  CALCIUM 8.4*  AST 18  ALT 11  ALKPHOS 59  BILITOT 0.5    ------------------------------------------------------------------------------------------------------------------ estimated creatinine clearance is 5.9 mL/min (A) (by C-G formula based on SCr of 7.81 mg/dL (H)). ------------------------------------------------------------------------------------------------------------------ No results for input(s): TSH, T4TOTAL, T3FREE, THYROIDAB in the last 72 hours.  Invalid input(s): FREET3   Coagulation profile No results for input(s): INR, PROTIME in the last 168 hours. ------------------------------------------------------------------------------------------------------------------- No results for input(s): DDIMER in the last 72 hours. -------------------------------------------------------------------------------------------------------------------  Cardiac Enzymes No results for input(s): CKMB, TROPONINI, MYOGLOBIN in the last 168 hours.  Invalid input(s): CK ------------------------------------------------------------------------------------------------------------------    Component Value Date/Time   BNP 3,320.0 (H) 11/03/2017 0801   ---------------------------------------------------------------------------------------------------------------  Urinalysis    Component Value Date/Time   COLORURINE AMBER (A) 11/19/2017 0300   APPEARANCEUR HAZY (A) 11/19/2017 0300   LABSPEC 1.021 11/19/2017 0300   PHURINE 5.0 11/19/2017 0300   GLUCOSEU NEGATIVE 11/19/2017 0300   HGBUR NEGATIVE 11/19/2017 0300   BILIRUBINUR NEGATIVE 11/19/2017 0300   KETONESUR NEGATIVE 11/19/2017 0300   PROTEINUR NEGATIVE 11/19/2017 0300   UROBILINOGEN 1.0 03/17/2014 1830   NITRITE NEGATIVE 11/19/2017 0300   LEUKOCYTESUR MODERATE (A) 11/19/2017 0300    ----------------------------------------------------------------------------------------------------------------   Imaging Results:    Ct Abdomen Pelvis W Contrast  Result Date: 07/28/2018 CLINICAL DATA:   72 year old female with RIGHT abdominal and pelvic pain for 3 months. History of chronic kidney disease. EXAM: CT ABDOMEN AND PELVIS WITH CONTRAST TECHNIQUE: Multidetector CT imaging of the abdomen and pelvis was performed using the standard protocol following bolus administration of intravenous contrast. CONTRAST:  122mL ISOVUE-300 IOPAMIDOL (ISOVUE-300) INJECTION 61% COMPARISON:  11/02/2017 CT FINDINGS: Lower chest: No acute abnormality. Cardiomegaly again noted. Coronary artery and aortic atherosclerotic calcifications again identified. Hepatobiliary: The liver and gallbladder are unremarkable except for small hepatic cysts. No biliary dilatation. Pancreas: Unremarkable Spleen: Unremarkable Adrenals/Urinary Tract: A 3 cm RIGHT UPPER pole mass is suspicious for renal cell carcinoma. No other renal abnormalities noted. No definite renal vein thrombosis. The adrenal glands and bladder are unremarkable. Stomach/Bowel: Equivocal mild circumferential wall thickening of the RIGHT colon noted which could represent a colitis. No evidence of bowel obstruction. No other bowel wall thickening identified. The appendix is normal. Vascular/Lymphatic: Aortic atherosclerosis. No enlarged abdominal or pelvic lymph nodes. Reproductive: Status post hysterectomy. No adnexal masses. Other: No ascites, focal collection or pneumoperitoneum. Musculoskeletal: No acute bony abnormalities are identified. Degenerative changes within the lumbar spine noted. IMPRESSION: 1. Equivocal mild circumferential wall thickening of the RIGHT: Which may represent colitis. No evidence of bowel obstruction, pneumoperitoneum or abscess. 2. 3 cm RIGHT UPPER pole renal mass again identified, suspicious for renal cell carcinoma. No enlarged lymph nodes, RIGHT adrenal abnormality or venous thrombosis. 3. Cardiomegaly 4. Coronary artery and Aortic Atherosclerosis (ICD10-I70.0). Electronically Signed   By: Margarette Canada M.D.   On: 07/28/2018 17:26     Radiological Exams on Admission: Ct Abdomen Pelvis W Contrast  Result Date: 07/28/2018 CLINICAL DATA:  72 year old female with RIGHT abdominal and pelvic pain for 3 months. History of chronic kidney disease. EXAM: CT ABDOMEN AND PELVIS WITH CONTRAST TECHNIQUE: Multidetector CT imaging of the abdomen and pelvis was performed using the standard protocol following bolus administration of intravenous contrast. CONTRAST:  161mL ISOVUE-300 IOPAMIDOL (ISOVUE-300) INJECTION 61% COMPARISON:  11/02/2017 CT FINDINGS: Lower chest: No acute abnormality. Cardiomegaly again noted. Coronary artery and aortic atherosclerotic calcifications again identified. Hepatobiliary: The liver and gallbladder are unremarkable except for small hepatic cysts. No biliary dilatation. Pancreas: Unremarkable Spleen: Unremarkable Adrenals/Urinary Tract: A 3 cm RIGHT UPPER pole mass is suspicious for renal cell carcinoma. No other renal abnormalities noted. No definite renal vein thrombosis. The adrenal glands and bladder are unremarkable. Stomach/Bowel: Equivocal mild circumferential wall thickening of the RIGHT colon noted which could represent a colitis. No evidence of bowel obstruction. No other bowel wall thickening identified. The appendix is normal. Vascular/Lymphatic: Aortic atherosclerosis. No enlarged abdominal or pelvic lymph nodes. Reproductive: Status post hysterectomy. No adnexal masses. Other: No ascites, focal collection or pneumoperitoneum. Musculoskeletal: No acute bony abnormalities are identified. Degenerative changes within the lumbar spine noted. IMPRESSION: 1. Equivocal mild circumferential wall thickening of the RIGHT: Which may represent colitis. No evidence of bowel obstruction, pneumoperitoneum or abscess. 2. 3 cm RIGHT UPPER pole renal mass again identified, suspicious for renal cell carcinoma. No enlarged lymph nodes, RIGHT adrenal abnormality or venous thrombosis. 3. Cardiomegaly 4. Coronary artery and Aortic  Atherosclerosis (ICD10-I70.0). Electronically Signed   By: Margarette Canada M.D.   On: 07/28/2018 17:26   DVT Prophylaxis -SCD /apixaban AM Labs Ordered, also please review Full Orders  Family Communication: Admission, patients condition and plan of care including tests being ordered have been discussed with the patient and spouse who indicate understanding and agree with the plan   Code Status - Full Code  Likely DC to  home  Condition   stable  Roxan Hockey M.D on 07/29/2018 at 6:53 PM Pager---[phone] Go to [email or web address] - [email or web address] for contact info  [facility] - Office  [phone]

## 2018-07-30 DIAGNOSIS — E162 Hypoglycemia, unspecified: Secondary | ICD-10-CM | POA: Diagnosis not present

## 2018-07-30 LAB — RENAL FUNCTION PANEL
Albumin: 2.2 g/dL — ABNORMAL LOW (ref 3.5–5.0)
Anion gap: 11 (ref 5–15)
BUN: 50 mg/dL — ABNORMAL HIGH (ref 8–23)
CALCIUM: 7.9 mg/dL — AB (ref 8.9–10.3)
CO2: 23 mmol/L (ref 22–32)
CREATININE: 4.41 mg/dL — AB (ref 0.44–1.00)
Chloride: 99 mmol/L (ref 98–111)
GFR, EST AFRICAN AMERICAN: 11 mL/min — AB (ref >60)
GFR, EST NON AFRICAN AMERICAN: 9 mL/min — AB (ref >60)
Glucose, Bld: 367 mg/dL — ABNORMAL HIGH (ref 70–99)
Phosphorus: 2.9 mg/dL (ref 2.5–4.6)
Potassium: 4.2 mmol/L (ref 3.5–5.1)
SODIUM: 133 mmol/L — AB (ref 135–145)

## 2018-07-30 LAB — CBC
HCT: 37.5 % (ref 36.0–46.0)
Hemoglobin: 11.3 g/dL — ABNORMAL LOW (ref 12.0–15.0)
MCH: 28.1 pg (ref 26.0–34.0)
MCHC: 30.1 g/dL (ref 30.0–36.0)
MCV: 93.3 fL (ref 80.0–100.0)
NRBC: 0 % (ref 0.0–0.2)
PLATELETS: 252 10*3/uL (ref 150–400)
RBC: 4.02 MIL/uL (ref 3.87–5.11)
RDW: 17.9 % — ABNORMAL HIGH (ref 11.5–15.5)
WBC: 7 10*3/uL (ref 4.0–10.5)

## 2018-07-30 LAB — GLUCOSE, CAPILLARY
GLUCOSE-CAPILLARY: 365 mg/dL — AB (ref 70–99)
GLUCOSE-CAPILLARY: 174 mg/dL — AB (ref 70–99)

## 2018-07-30 MED ORDER — INSULIN ASPART 100 UNIT/ML ~~LOC~~ SOLN: 0.0000 [IU] | Freq: Every day | SUBCUTANEOUS | Status: DC

## 2018-07-30 MED ORDER — INSULIN DETEMIR 100 UNIT/ML FLEXPEN: 4.0000 [IU] | PEN_INJECTOR | Freq: Every morning | SUBCUTANEOUS | 11 refills | Status: DC

## 2018-07-30 MED ORDER — INSULIN ASPART 100 UNIT/ML ~~LOC~~ SOLN
0.0000 [IU] | Freq: Three times a day (TID) | SUBCUTANEOUS | Status: DC
  Administered 2018-07-30: 9 [IU] via SUBCUTANEOUS
  Administered 2018-07-30: 2 [IU] via SUBCUTANEOUS

## 2018-07-30 NOTE — Care Management Note (Addendum)
Case Management Note  Patient Details  Name: [name] MRN: [id number] Date of Birth: Oct 21, 1945  Subjective/Objective:     Hypolglycemia. From home with husband. Recommended for SNF. Patient and husband decline. Reports she has help at home and home health. Unaware of agency name. Husband to call CM with name.                Action/Plan: DC home with continuation of home health. Patient is in observation and will not need resumption orders.   ADDENDUM: Spoke with Mr. Tijerino, he states patient has [facility]. Santiago Glad of [employer] notified.   Expected Discharge Date:  07/30/18               Expected Discharge Plan:  [place]  In-House Referral:     Discharge planning Services  CM Consult  Post Acute Care Choice:  Home Health, Resumption of Svcs/PTA Provider Choice offered to:  Patient  DME Arranged:    DME Agency:     HH Arranged:    [place] Agency:     Status of Service:  Completed, signed off  If discussed at [employer], dates discussed:    Additional Comments:  Kaylub Detienne, Chauncey Reading, RN 07/30/2018, 12:49 PM

## 2018-07-30 NOTE — Progress Notes (Signed)
Subjective: Interval History: has no complaint of nausea or vomiting.  Patient also denies any difficulty breathing..  Objective: Vital signs in last 24 hours: Temp:  [97.7 F (36.5 C)-98.3 F (36.8 C)] 98.2 F (36.8 C) (11/01 0547) Pulse Rate:  [62-84] 73 (11/01 0547) Resp:  [13-18] 16 (11/01 0120) BP: (103-210)/(32-71) 153/39 (11/01 0547) SpO2:  [96 %-100 %] 99 % (11/01 0547) Weight:  [63.1 kg-68.2 kg] 63.1 kg (11/01 0120) Weight change:   Intake/Output from previous day: 10/31 0701 - 11/01 0700 In: 1300.1 [P.O.:240; I.V.:1060.1] Out: 3400  Intake/Output this shift: Total I/O In: 480.9 [P.O.:240; I.V.:240.9] Out: 500 [Urine:500]  General appearance: alert, cooperative and no distress Resp: clear to auscultation bilaterally Cardio: regular rate and rhythm Extremities: No edema  Lab Results: Recent Labs    07/29/18 1211 07/30/18 0455  WBC 7.9 7.0  HGB 10.9* 11.3*  HCT 38.4 37.5  PLT 237 252   BMET:  Recent Labs    07/29/18 1211 07/30/18 0455  NA 134* 133*  K 4.5 4.2  CL 100 99  CO2 19* 23  GLUCOSE 96 367*  BUN 123* 50*  CREATININE 7.81* 4.41*  CALCIUM 8.4* 7.9*   No results for input(Little): PTH in the last 72 hours. Iron Studies: No results for input(Little): IRON, TIBC, TRANSFERRIN, FERRITIN in the last 72 hours.  Studies/Results: Ct Abdomen Pelvis W Contrast  Result Date: 07/28/2018 CLINICAL DATA:  72 year old female with RIGHT abdominal and pelvic pain for 3 months. History of chronic kidney disease. EXAM: CT ABDOMEN AND PELVIS WITH CONTRAST TECHNIQUE: Multidetector CT imaging of the abdomen and pelvis was performed using the standard protocol following bolus administration of intravenous contrast. CONTRAST:  125mL ISOVUE-300 IOPAMIDOL (ISOVUE-300) INJECTION 61% COMPARISON:  11/02/2017 CT FINDINGS: Lower chest: No acute abnormality. Cardiomegaly again noted. Coronary artery and aortic atherosclerotic calcifications again identified. Hepatobiliary: The liver and  gallbladder are unremarkable except for small hepatic cysts. No biliary dilatation. Pancreas: Unremarkable Spleen: Unremarkable Adrenals/Urinary Tract: A 3 cm RIGHT UPPER pole mass is suspicious for renal cell carcinoma. No other renal abnormalities noted. No definite renal vein thrombosis. The adrenal glands and bladder are unremarkable. Stomach/Bowel: Equivocal mild circumferential wall thickening of the RIGHT colon noted which could represent a colitis. No evidence of bowel obstruction. No other bowel wall thickening identified. The appendix is normal. Vascular/Lymphatic: Aortic atherosclerosis. No enlarged abdominal or pelvic lymph nodes. Reproductive: Status post hysterectomy. No adnexal masses. Other: No ascites, focal collection or pneumoperitoneum. Musculoskeletal: No acute bony abnormalities are identified. Degenerative changes within the lumbar spine noted. IMPRESSION: 1. Equivocal mild circumferential wall thickening of the RIGHT: Which may represent colitis. No evidence of bowel obstruction, pneumoperitoneum or abscess. 2. 3 cm RIGHT UPPER pole renal mass again identified, suspicious for renal cell carcinoma. No enlarged lymph nodes, RIGHT adrenal abnormality or venous thrombosis. 3. Cardiomegaly 4. Coronary artery and Aortic Atherosclerosis (ICD10-I70.0). Electronically Signed   By: Margarette Canada M.D.   On: 07/28/2018 17:26    I have reviewed the patient'Little current medications.  Assessment/Plan: 1] end-stage renal disease: She is status post hemodialysis yesterday.  Presently patient does not have any uremic signs and symptoms. 2] hypoglycemia: Her blood sugar has improved 3] hypertension: Her blood pressure is reasonably controlled. 4] bone and mineral disorder: Her calcium and phosphorus is range 5] anemia: Her hemoglobin is within our target goal. 6] fluid management patient presently asymptomatic and she does not have any significant sign of fluid overload. Plan: Patient does not require  dialysis today.  2] her next dialysis will be tomorrow which is her regular schedule and she could call to her outpatient unit.  I have discussed with the patient.    LOS: 0 days   [name] 07/30/2018,11:46 AM

## 2018-07-30 NOTE — Discharge Summary (Signed)
[name], is a 72 y.o. female  DOB 01-24-1946  MRN [id number].  Admission date:  07/29/2018  Admitting Physician  Roxan Hockey, MD  Discharge Date:  07/30/2018   Primary MD  Sharilyn Sites, MD  Recommendations for primary care physician for things to follow:   Status post right AKA, left foot gangrene, generalized weakness debility and end-stage renal disease  1) please keep your appointment for outpatient hemodialysis on Saturday, 07/31/2018 2) in order to avoid episodes of low blood sugar and you  need to eat small snacks between your meals and please eat a snack at bedtime before you go to bed to prevent low blood sugars 3) continue medications as advised and follow-up with your primary care physician for reevaluation within a week 4) you have declined placement of a skilled nursing facility at this time we will arrange for home health physical therapy for you at home 5) decrease Lantus insulin to 4 units daily  Admission Diagnosis  Hypoglycemia due to insulin [E16.0, T38.3X5A] Gangrene of left foot ([place]) [I96]   Discharge Diagnosis  Hypoglycemia due to insulin [E16.0, T38.3X5A] Gangrene of left foot (HCC) [I96]    Principal Problem:   Hypoglycemia Active Problems:   Type 2 diabetes mellitus with diabetic neuropathy (HCC)   SLE (systemic lupus erythematosus) ([place])   Essential hypertension, benign   ESRD (end stage renal disease) on dialysis [facility])   Atrial fibrillation ([place])   Diabetic nephropathy associated with type 2 diabetes mellitus ([place])      Past Medical History:  Diagnosis Date  . Anemia   . Arthritis   . Asthma   . Atrial fibrillation ([place])   . Atrial flutter [facility])    TEE/DCCV December 2013 - on Xarelto [facility])  . Bone spur of toe    Right 5th toe  . Chronic diastolic CHF (congestive heart failure) (HCC)    a. EF 50-55% by echo in 2015  . CKD (chronic kidney  disease) stage 4, GFR 15-29 ml/min (HCC)    a. s/p fistula placement but not on HD. Followed by Nephrology.   . Colon polyposis   . COPD (chronic obstructive pulmonary disease) ([place])   . DVT of upper extremity (deep vein thrombosis) ([place])    Right basilic vein, June 5015  . Essential hypertension, benign   . Fractures   . Glaucoma   . Lupus ([place])   . Pericarditis   . SLE (systemic lupus erythematosus) ([place])   . Type 2 diabetes mellitus ([place])   . UTI (lower urinary tract infection)   . Wound of right leg 06/2017    Past Surgical History:  Procedure Laterality Date  . ABDOMINAL AORTOGRAM W/LOWER EXTREMITY N/A 12/18/2017   Procedure: ABDOMINAL AORTOGRAM W/LOWER EXTREMITY;  Surgeon: Elam Dutch, MD;  Location: [place] CV LAB;  Service: Cardiovascular;  Laterality: N/A;  bilateral  . ABDOMINAL HYSTERECTOMY  1983  . AMPUTATION Right 06/08/2018   Procedure: RIGHT ABOVE KNEE AMPUTATION;  Surgeon: Elam Dutch, MD;  Location: [place];  Service: Vascular;  Laterality: Right;  . ANKLE SURGERY  1993  . AV FISTULA PLACEMENT Left 03/27/2014   Procedure: ARTERIOVENOUS (AV) FISTULA CREATION;  Surgeon: Rosetta Posner, MD;  Location: [place];  Service: Vascular;  Laterality: Left;  . BACK SURGERY  1980  . BASCILIC VEIN TRANSPOSITION Left 01/13/2018   Procedure: LEFT BASILIC VEIN TRANSPOSITION SECOND STAGE;  Surgeon: Elam Dutch, MD;  Location: [place];  Service: Vascular;  Laterality: Left;  . CARDIOVASCULAR STRESS TEST  12/19/2009   no stress induced rhythm abnormalities, ekg negative for ischemia  . CARDIOVERSION  09/16/2012   Procedure: CARDIOVERSION;  Surgeon: Sanda Klein, MD;  [facility];  Service: Cardiovascular;  Laterality: N/A;  . CATARACT EXTRACTION    . COLONOSCOPY  09/20/2012   Dr. [name]: multiple tubular adenomas   . COLONOSCOPY  2005   Dr. [name]. Polyps, path unknown   . COLONOSCOPY N/A 07/24/2016   Dr. [name]: 3 significant size polyps removed from the  colon, tubular adenomas.  Next colonoscopy in October 2020.  Marland Kitchen ESOPHAGOGASTRODUODENOSCOPY  09/20/2012   Dr. [name]: duodenal erosion and possible resolving ulcer at the angularis   . ESOPHAGOGASTRODUODENOSCOPY N/A 03/24/2018   Dr. [name]: Mild erosive reflux esophagitis, erosive gastropathy and enteropathy fairly extensive, no H. pylori on biopsy.  Marland Kitchen EYE SURGERY    . IR FLUORO GUIDE CV LINE RIGHT  11/09/2017  . IR US GUIDE VASC ACCESS RIGHT  11/09/2017  . REVISON OF ARTERIOVENOUS FISTULA Left 03/15/2018   Procedure: REVISION OF Left arm BASILIC VEIN TRANSPOSITION;  Surgeon: Angelia Mould, MD;  Location: [place];  Service: Vascular;  Laterality: Left;  . TEE WITHOUT CARDIOVERSION  09/16/2012   Procedure: TRANSESOPHAGEAL ECHOCARDIOGRAM (TEE);  Surgeon: Sanda Klein, MD;  Location[facility] ENDOSCOPY;  Service: Cardiovascular;  Laterality: N/A;  . TRANSESOPHAGEAL ECHOCARDIOGRAM WITH CARDIOVERSION  09/16/2012   EF 60-65%, moderate LVH, moderate regurg of the aortic valve, LA moderately dilated       HPI  from the history and physical done on the day of admission:     [name]  is a 72 y.o. female with Pmhx of DM2, HTN, Lupus, ESRD on HD T/T/S last hemodialysis session on 07/24/2018..... Now presents with generalized weakness and difficulty walking...Marland KitchenMarland KitchenMarland Kitchen patient has sluggishness, fatigue, no significant confusion, no vomiting or diarrhea, no fevers no chills  In ED... She is found to be persistently hypoglycemic with glucose in the 40s and 50s, significant order she is oral intake has been poor patient has been taking insulin as prescribed suspect insulin stacking in the absence of hemodialysis for almost a week........  No chest pains no palpitations no dizziness no shortness of breath  EDP.. Initiated IV dextrose solution....   Discussed with on-call nephrologist to initiate HD on 07/29/2018     Hospital Course:   1)DM2 with Persistent Hypoglycemia--due to insulin stacking  in the setting of mixed multiple sessions of hemodialysis and poor oral intake, despite IV dextrose solution hypoglycemia persisted..... Glycemic control has improved significantly since hemodialysis late on 07/29/2018 .   A1c was 5.4 couple weeks ago, decrease Lantus insulin to 4 units daily to avoid further hypoglycemic episodes, she is advised to eat snacks at bedtime to avoid a.m. hypoglycemic episodes  2)ESRD on HD T/T/S last hemodialysis session on 07/24/2018---nephrologist consult appreciated, patient had hemodialysis on 07/29/2018,  3)Chronic Atrial Fibrillation/History of DVT ---  stable, rate appears controlled, continue apixaban for anticoagulation, continue amiodarone and metoprolol rate control  4)History of Lupus----stable, continue prednisone  5 mg daily  , continue Plaquenil and folic acid, fentanyl patch for chronic pain  5)Anemia of ESRD----stable, EPO agent per nephrologist    Discharge Condition: stable  Follow UP   Consults obtained - Nephrology for HD  Diet and Activity recommendation:  As advised  Discharge Instructions    Discharge Instructions    Call MD for:  difficulty breathing, headache or visual disturbances   Complete by:  As directed    Call MD for:  persistant dizziness or light-headedness   Complete by:  As directed    Call MD for:  persistant nausea and vomiting   Complete by:  As directed    Call MD for:  severe uncontrolled pain   Complete by:  As directed    Call MD for:  temperature >100.4   Complete by:  As directed    Diet - low sodium heart healthy   Complete by:  As directed    Discharge instructions   Complete by:  As directed    1) please keep your appointment for outpatient hemodialysis on Saturday, 07/31/2018 2) in order to avoid episodes of low blood sugar and you  need to eat small snacks between your meals and please eat a snack at bedtime before you go to bed to prevent low blood sugars 3) continue medications as advised and  follow-up with your primary care physician for reevaluation within a week 4) you have declined placement of a skilled nursing facility at this time we will arrange for home health physical therapy for you at home 5) decrease Lantus insulin to 4 units daily   Increase activity slowly   Complete by:  As directed    Mostly wheelchair-bound, fall precaution        Discharge Medications     Allergies as of 07/30/2018      Reactions   Ace Inhibitors Cough      Medication List    TAKE these medications   acetaminophen 325 MG tablet Commonly known as:  TYLENOL Take 1-2 tablets (325-650 mg total) by mouth every 4 (four) hours as needed for mild pain. What changed:    how much to take  when to take this  reasons to take this   albuterol 108 (90 Base) MCG/ACT inhaler Commonly known as:  PROVENTIL HFA;VENTOLIN HFA Inhale 2 puffs into the lungs every 6 (six) hours as needed for shortness of breath.   allopurinol 100 MG tablet Commonly known as:  ZYLOPRIM Take 100 mg by mouth every Monday, Wednesday, and Friday.   amiodarone 200 MG tablet Commonly known as:  PACERONE Take 200 mg twice a day for 2 weeks, THEN take 200 mg daily thereafter What changed:    how much to take  how to take this  when to take this   apixaban 5 MG Tabs tablet Commonly known as:  ELIQUIS Take 1 tablet (5 mg total) by mouth 2 (two) times daily.   atorvastatin 20 MG tablet Commonly known as:  LIPITOR Take 20 mg by mouth at bedtime.   calcium acetate 667 MG capsule Commonly known as:  PHOSLO Take 667 mg by mouth 3 (three) times daily with meals.   COMBIGAN 0.2-0.5 % ophthalmic solution Generic drug:  brimonidine-timolol Place 1 drop into the left eye 2 (two) times daily.   DIALYVITE 800 0.8 MG Tabs Take 1 tablet by mouth daily.   feeding supplement (PRO-STAT SUGAR FREE 64) Liqd Take 30 mLs by mouth 2 (two) times daily.  fentaNYL 50 MCG/HR Commonly known as:  DURAGESIC - dosed  mcg/hr Place 25 mcg onto the skin every 3 (three) days.   folic acid 1 MG tablet Commonly known as:  FOLVITE Take 1 mg by mouth daily.   HUMALOG KWIKPEN 100 UNIT/ML KiwkPen Generic drug:  insulin lispro Inject 5 Units into the skin 3 (three) times daily with meals as needed (*Only take if blood sugar levels are over 100).   hydroxychloroquine 200 MG tablet Commonly known as:  PLAQUENIL Take 200 mg by mouth daily.   Insulin Detemir 100 UNIT/ML Pen Commonly known as:  LEVEMIR Inject 4 Units into the skin every morning. What changed:  how much to take   metoprolol tartrate 25 MG tablet Commonly known as:  LOPRESSOR Take 1/2 tablet twice a day on Mondays, Wednesdays and Fridays What changed:    how much to take  how to take this  when to take this  additional instructions   metroNIDAZOLE 500 MG tablet Commonly known as:  FLAGYL Take 1 tablet (500 mg total) by mouth 2 (two) times daily. For patient and partner, (expedited partner therapy)   oxyCODONE-acetaminophen 5-325 MG tablet Commonly known as:  PERCOCET/ROXICET Take 1 tablet by mouth every 6 (six) hours as needed for severe pain. What changed:  when to take this   pantoprazole 40 MG tablet Commonly known as:  PROTONIX Take 1 tablet (40 mg total) by mouth daily.   predniSONE 5 MG tablet Commonly known as:  DELTASONE Take 5 mg by mouth daily with breakfast.   travoprost (benzalkonium) 0.004 % ophthalmic solution Commonly known as:  TRAVATAN Place 1 drop into both eyes at bedtime.       Major procedures and Radiology Reports - PLEASE review detailed and final reports for all details, in brief -   Ct Abdomen Pelvis W Contrast  Result Date: 07/28/2018 CLINICAL DATA:  72 year old female with RIGHT abdominal and pelvic pain for 3 months. History of chronic kidney disease. EXAM: CT ABDOMEN AND PELVIS WITH CONTRAST TECHNIQUE: Multidetector CT imaging of the abdomen and pelvis was performed using the standard  protocol following bolus administration of intravenous contrast. CONTRAST:  137mL ISOVUE-300 IOPAMIDOL (ISOVUE-300) INJECTION 61% COMPARISON:  11/02/2017 CT FINDINGS: Lower chest: No acute abnormality. Cardiomegaly again noted. Coronary artery and aortic atherosclerotic calcifications again identified. Hepatobiliary: The liver and gallbladder are unremarkable except for small hepatic cysts. No biliary dilatation. Pancreas: Unremarkable Spleen: Unremarkable Adrenals/Urinary Tract: A 3 cm RIGHT UPPER pole mass is suspicious for renal cell carcinoma. No other renal abnormalities noted. No definite renal vein thrombosis. The adrenal glands and bladder are unremarkable. Stomach/Bowel: Equivocal mild circumferential wall thickening of the RIGHT colon noted which could represent a colitis. No evidence of bowel obstruction. No other bowel wall thickening identified. The appendix is normal. Vascular/Lymphatic: Aortic atherosclerosis. No enlarged abdominal or pelvic lymph nodes. Reproductive: Status post hysterectomy. No adnexal masses. Other: No ascites, focal collection or pneumoperitoneum. Musculoskeletal: No acute bony abnormalities are identified. Degenerative changes within the lumbar spine noted. IMPRESSION: 1. Equivocal mild circumferential wall thickening of the RIGHT: Which may represent colitis. No evidence of bowel obstruction, pneumoperitoneum or abscess. 2. 3 cm RIGHT UPPER pole renal mass again identified, suspicious for renal cell carcinoma. No enlarged lymph nodes, RIGHT adrenal abnormality or venous thrombosis. 3. Cardiomegaly 4. Coronary artery and Aortic Atherosclerosis (ICD10-I70.0). Electronically Signed   By: Margarette Canada M.D.   On: 07/28/2018 17:26    Micro Results   Today   Subjective  [name] today has no new complaints, eating and drinking well, no nausea no vomiting no diarrhea, husband at bedside, questions answered,          Patient has been seen and examined prior to  discharge   Objective   Blood pressure (!) 153/39, pulse 73, temperature 98.2 F (36.8 C), temperature source Oral, resp. rate 16, height 5\' 5"  (1.651 m), weight 63.1 kg, SpO2 99 %.   Intake/Output Summary (Last 24 hours) at 07/30/2018 1218 Last data filed at 07/30/2018 3500 Gross per 24 hour  Intake 1781.04 ml  Output 3900 ml  Net -2118.96 ml    Exam Physical Examination: General appearance - alert, well appearing, and in no distress  Mental status - alert, oriented to person, place, and time,  Eyes - sclera anicteric Neck - supple, no JVD elevation , hemodialysis tunneled catheter site is clean dry and intact Chest - clear  to auscultation bilaterally, symmetrical air movement,  Heart - S1 and S2 normal, regular Abdomen - soft, nontender, nondistended, no masses or organomegaly, no CVA tenderness Neurological -generalized weakness without new focal deficits Extremities -right AKA, left foot with significant , extensive area of dry gangrene, left arm AV fistula appears to be nonfunctional Skin - warm, dry   Data Review   CBC w Diff:  Lab Results  Component Value Date   WBC 7.0 07/30/2018   HGB 11.3 (L) 07/30/2018   HGB 8.4 (L) 07/17/2017   HCT 37.5 07/30/2018   HCT 24.9 (L) 12/23/2017   PLT 252 07/30/2018   LYMPHOPCT 5 07/29/2018   BANDSPCT 0 03/13/2014   MONOPCT 9 07/29/2018   EOSPCT 1 07/29/2018   BASOPCT 0 07/29/2018    CMP:  Lab Results  Component Value Date   NA 133 (L) 07/30/2018   NA 136 05/05/2018   K 4.2 07/30/2018   CL 99 07/30/2018   CO2 23 07/30/2018   BUN 50 (H) 07/30/2018   BUN 50 (H) 05/05/2018   CREATININE 4.41 (H) 07/30/2018   PROT 6.5 07/29/2018   PROT 6.0 05/05/2018   ALBUMIN 2.2 (L) 07/30/2018   ALBUMIN 2.6 (L) 05/05/2018   BILITOT 0.5 07/29/2018   BILITOT 0.3 05/05/2018   ALKPHOS 59 07/29/2018   AST 18 07/29/2018   ALT 11 07/29/2018  .   Total Discharge time is about 33 minutes  Roxan Hockey M.D on 07/30/2018 at 12:18  PM  Pager---[phone]  Go to [email or web address] - [email or web address] for contact info  [facility] - Office  [phone]

## 2018-07-30 NOTE — Care Management Obs Status (Signed)
MEDICARE OBSERVATION STATUS NOTIFICATION   Patient Details  Name: [name] MRN: [id number] Date of Birth: 04/05/1946   Medicare Observation Status Notification Given:  Yes    Shelda Altes 07/30/2018, 11:43 AM

## 2018-07-30 NOTE — Discharge Instructions (Signed)
Status post right AKA, left foot gangrene, generalized weakness debility and end-stage renal disease    1) please keep your appointment for outpatient hemodialysis on Saturday, 07/31/2018 2) in order to avoid episodes of low blood sugar and you  need to eat small snacks between your meals and please eat a snack at bedtime before you go to bed to prevent low blood sugars 3) continue medications as advised and follow-up with your primary care physician for reevaluation within a week 4) you have declined placement of a skilled nursing facility at this time we will arrange for home health physical therapy for you at home 5) decrease Lantus insulin to 4 units daily

## 2018-07-30 NOTE — Plan of Care (Signed)
  Problem: Acute Rehab PT Goals(only PT should resolve) Goal: Pt Will Go Supine/Side To Sit Outcome: Progressing Flowsheets (Taken 07/30/2018 1232) Pt will go Supine/Side to Sit: with moderate assist Goal: Pt Will Go Sit To Supine/Side Outcome: Progressing Flowsheets (Taken 07/30/2018 1232) Pt will go Sit to Supine/Side: with moderate assist Goal: Patient Will Perform Sitting Balance Outcome: Progressing Flowsheets (Taken 07/30/2018 1232) Patient will perform sitting balance: with supervision Goal: Pt Will Transfer Bed To Chair/Chair To Bed Outcome: Progressing Flowsheets (Taken 07/30/2018 1232) Pt will Transfer Bed to Chair/Chair to Bed: with mod assist   12:33 PM, 07/30/18 Lonell Grandchild, MPT Physical Therapist with [facility] [phone] office [phone] mobile phone

## 2018-07-30 NOTE — Progress Notes (Signed)
Pt discharged home today per Dr. Emokpae.  Pt's IV site D/C'd and WDL.  Pt's VSS.  Pt provided with home medication list, discharge instructions and prescriptions.  Verbalized understanding.  Pt left floor via WC in stable condition accompanied by NT. 

## 2018-07-30 NOTE — Evaluation (Signed)
Physical Therapy Evaluation Patient Details Name: [name] MRN: [id number] DOB: 11/30/1945 Today's Date: 07/30/2018   History of Present Illness  [name] is an 72 y.o. female.  HPI: She is a patient who has history of diabetes, hypertension, lupus, end-stage renal disease on maintenance hemodialysis presently was brought to the emergency room because of weakness and difficulty walking.  When she was evaluated patient was found to have hypoglycemia.  Presently patient is states that she is feeling much better.  She has some chills but no fever.  Patient also denies any nausea or vomiting.  Patient states that she got her last dialysis on Saturday.  She was not able to get to the unit on Tuesday before she was feeling sick.  Presently patient denies any difficulty breathing.  She does not have any chest pain.  She was having some left-sided abdominal pain which was worked up as an outpatient.    Clinical Impression  Patient had much difficulty sitting up at bedside and demonstrated poor tolerance for any pressure or weightbearing on left foot due to pain, fair sitting balance while resting, but loses balance when attempting to scoot laterally using sliding board mostly due to increased pain left foot when touching floor.  Patient tolerated sitting up in wheelchair after therapy - RN notified.  Patient will benefit from continued physical therapy in hospital and recommended venue below to increase strength, balance, endurance for safe ADLs and gait.    Follow Up Recommendations SNF;Supervision/Assistance - 24 hour;Supervision for mobility/OOB    Equipment Recommendations  None recommended by PT    Recommendations for Other Services       Precautions / Restrictions Precautions Precautions: Fall Restrictions Weight Bearing Restrictions: No      Mobility  Bed Mobility Overal bed mobility: Needs Assistance Bed Mobility: Sit to Supine       Sit to supine: Mod  assist;Max assist   General bed mobility comments: slow labored movement  Transfers Overall transfer level: Needs assistance   Transfers: Lateral/Scoot Transfers          Lateral/Scoot Transfers: With slide board;Max assist;+2 safety/equipment General transfer comment: slow labored movement with diffiuclty maintaining sitting balance due to left foot pain when weightbearing  Ambulation/Gait                Stairs            Wheelchair Mobility    Modified Rankin (Stroke Patients Only)       Balance Overall balance assessment: Needs assistance Sitting-balance support: Feet supported;Bilateral upper extremity supported Sitting balance-Leahy Scale: Fair Sitting balance - Comments: poor dynamic sitting balance mostly due to poor tolerance for weightbearing on left foot                                     Pertinent Vitals/Pain Pain Assessment: Faces Faces Pain Scale: Hurts whole lot Pain Location: left foot with any pressure Pain Descriptors / Indicators: Sharp;Sore;Grimacing;Guarding Pain Intervention(s): Limited activity within patient's tolerance;Monitored during session    Home Living Family/patient expects to be discharged to:: Private residence Living Arrangements: Spouse/significant other Available Help at Discharge: Family;Available 24 hours/day Type of Home: House Home Access: Ramped entrance     Home Layout: Two level Home Equipment: Cane - single point;Walker - 2 wheels;Wheelchair - manual;Shower seat;Bedside commode;Other (comment)      Prior Function Level of Independence: Needs assistance   Gait /  Transfers Assistance Needed: non ambulatory, assisted transfers to wheelchair, [facility] using sliding board with at least 1 person assist, sometimes 2   ADL's / Homemaking Assistance Needed: assisted by family, patient use depends for urination, assisted to [facility] for bowel movements using sliding board        Hand Dominance    Dominant Hand: Right    Extremity/Trunk Assessment   Upper Extremity Assessment Upper Extremity Assessment: Generalized weakness    Lower Extremity Assessment Lower Extremity Assessment: Generalized weakness;LLE deficits/detail LLE Deficits / Details: grossly -3/5 except left foot/ankle not tested due to pain    Cervical / Trunk Assessment Cervical / Trunk Assessment: Normal  Communication   Communication: No difficulties  Cognition Arousal/Alertness: Awake/alert Behavior During Therapy: WFL for tasks assessed/performed Overall Cognitive Status: Within Functional Limits for tasks assessed                                        General Comments      Exercises     Assessment/Plan    PT Assessment Patient needs continued PT services  PT Problem List Decreased strength;Decreased activity tolerance;Decreased balance;Decreased mobility       PT Treatment Interventions Functional mobility training;Therapeutic activities;Therapeutic exercise;Patient/family education;Wheelchair mobility training    PT Goals (Current goals can be found in the Care Plan section)  Acute Rehab PT Goals Patient Stated Goal: return home with family to assist PT Goal Formulation: With patient Time For Goal Achievement: 08/13/18 Potential to Achieve Goals: Fair    Frequency Min 3X/week   Barriers to discharge        Co-evaluation               AM-PAC PT "6 Clicks" Daily Activity  Outcome Measure Difficulty turning over in bed (including adjusting bedclothes, sheets and blankets)?: Unable Difficulty moving from lying on back to sitting on the side of the bed? : Unable Difficulty sitting down on and standing up from a chair with arms (e.g., wheelchair, bedside commode, etc,.)?: Unable Help needed moving to and from a bed to chair (including a wheelchair)?: A Lot Help needed walking in hospital room?: Total Help needed climbing 3-5 steps with a railing? : Total 6 Click  Score: 7    End of Session   Activity Tolerance: Patient tolerated treatment well;Patient limited by fatigue;Patient limited by pain Patient left: in chair;with call bell/phone within reach;with chair alarm set Nurse Communication: Mobility status;Other (comment)(RN notified that patient left up in chair) PT Visit Diagnosis: Unsteadiness on feet (R26.81);Muscle weakness (generalized) (M62.81);Difficulty in walking, not elsewhere classified (R26.2)    Time: 4765-4650 PT Time Calculation (min) (ACUTE ONLY): 36 min   Charges:   PT Evaluation $PT Eval Moderate Complexity: 1 Mod PT Treatments $Therapeutic Activity: 23-37 mins        12:30 PM, 07/30/18 Lonell Grandchild, MPT Physical Therapist with [facility] [phone] office [phone] mobile phone

## 2018-07-31 DIAGNOSIS — E876 Hypokalemia: Secondary | ICD-10-CM | POA: Diagnosis not present

## 2018-07-31 DIAGNOSIS — R197 Diarrhea, unspecified: Secondary | ICD-10-CM | POA: Diagnosis not present

## 2018-07-31 DIAGNOSIS — N186 End stage renal disease: Secondary | ICD-10-CM | POA: Diagnosis not present

## 2018-07-31 DIAGNOSIS — R52 Pain, unspecified: Secondary | ICD-10-CM | POA: Diagnosis not present

## 2018-07-31 DIAGNOSIS — E1129 Type 2 diabetes mellitus with other diabetic kidney complication: Secondary | ICD-10-CM | POA: Diagnosis not present

## 2018-07-31 DIAGNOSIS — N2581 Secondary hyperparathyroidism of renal origin: Secondary | ICD-10-CM | POA: Diagnosis not present

## 2018-07-31 DIAGNOSIS — D631 Anemia in chronic kidney disease: Secondary | ICD-10-CM | POA: Diagnosis not present

## 2018-07-31 DIAGNOSIS — D689 Coagulation defect, unspecified: Secondary | ICD-10-CM | POA: Diagnosis not present

## 2018-07-31 LAB — HEPATITIS B SURFACE ANTIGEN: HEP B S AG: NEGATIVE

## 2018-08-02 ENCOUNTER — Other Ambulatory Visit: Payer: Self-pay

## 2018-08-02 DIAGNOSIS — I4891 Unspecified atrial fibrillation: Secondary | ICD-10-CM | POA: Diagnosis not present

## 2018-08-02 DIAGNOSIS — M329 Systemic lupus erythematosus, unspecified: Secondary | ICD-10-CM | POA: Diagnosis not present

## 2018-08-02 DIAGNOSIS — Z794 Long term (current) use of insulin: Secondary | ICD-10-CM | POA: Diagnosis not present

## 2018-08-02 DIAGNOSIS — I872 Venous insufficiency (chronic) (peripheral): Secondary | ICD-10-CM | POA: Diagnosis not present

## 2018-08-02 DIAGNOSIS — I739 Peripheral vascular disease, unspecified: Secondary | ICD-10-CM

## 2018-08-02 DIAGNOSIS — Z992 Dependence on renal dialysis: Secondary | ICD-10-CM | POA: Diagnosis not present

## 2018-08-02 DIAGNOSIS — E1142 Type 2 diabetes mellitus with diabetic polyneuropathy: Secondary | ICD-10-CM | POA: Diagnosis not present

## 2018-08-02 DIAGNOSIS — E785 Hyperlipidemia, unspecified: Secondary | ICD-10-CM | POA: Diagnosis not present

## 2018-08-02 DIAGNOSIS — M109 Gout, unspecified: Secondary | ICD-10-CM | POA: Diagnosis not present

## 2018-08-02 DIAGNOSIS — E11319 Type 2 diabetes mellitus with unspecified diabetic retinopathy without macular edema: Secondary | ICD-10-CM | POA: Diagnosis not present

## 2018-08-02 DIAGNOSIS — H409 Unspecified glaucoma: Secondary | ICD-10-CM | POA: Diagnosis not present

## 2018-08-02 DIAGNOSIS — Z86718 Personal history of other venous thrombosis and embolism: Secondary | ICD-10-CM | POA: Diagnosis not present

## 2018-08-02 DIAGNOSIS — N186 End stage renal disease: Secondary | ICD-10-CM | POA: Diagnosis not present

## 2018-08-02 DIAGNOSIS — Z4781 Encounter for orthopedic aftercare following surgical amputation: Secondary | ICD-10-CM | POA: Diagnosis not present

## 2018-08-02 DIAGNOSIS — D631 Anemia in chronic kidney disease: Secondary | ICD-10-CM | POA: Diagnosis not present

## 2018-08-02 DIAGNOSIS — Z87891 Personal history of nicotine dependence: Secondary | ICD-10-CM | POA: Diagnosis not present

## 2018-08-02 DIAGNOSIS — Z89611 Acquired absence of right leg above knee: Secondary | ICD-10-CM | POA: Diagnosis not present

## 2018-08-02 DIAGNOSIS — I129 Hypertensive chronic kidney disease with stage 1 through stage 4 chronic kidney disease, or unspecified chronic kidney disease: Secondary | ICD-10-CM | POA: Diagnosis not present

## 2018-08-02 DIAGNOSIS — J449 Chronic obstructive pulmonary disease, unspecified: Secondary | ICD-10-CM | POA: Diagnosis not present

## 2018-08-02 DIAGNOSIS — M150 Primary generalized (osteo)arthritis: Secondary | ICD-10-CM | POA: Diagnosis not present

## 2018-08-02 DIAGNOSIS — E1122 Type 2 diabetes mellitus with diabetic chronic kidney disease: Secondary | ICD-10-CM | POA: Diagnosis not present

## 2018-08-02 DIAGNOSIS — M1990 Unspecified osteoarthritis, unspecified site: Secondary | ICD-10-CM | POA: Diagnosis not present

## 2018-08-02 DIAGNOSIS — E1151 Type 2 diabetes mellitus with diabetic peripheral angiopathy without gangrene: Secondary | ICD-10-CM | POA: Diagnosis not present

## 2018-08-03 DIAGNOSIS — E1129 Type 2 diabetes mellitus with other diabetic kidney complication: Secondary | ICD-10-CM | POA: Diagnosis not present

## 2018-08-03 DIAGNOSIS — D689 Coagulation defect, unspecified: Secondary | ICD-10-CM | POA: Diagnosis not present

## 2018-08-03 DIAGNOSIS — N2581 Secondary hyperparathyroidism of renal origin: Secondary | ICD-10-CM | POA: Diagnosis not present

## 2018-08-03 DIAGNOSIS — N186 End stage renal disease: Secondary | ICD-10-CM | POA: Diagnosis not present

## 2018-08-03 DIAGNOSIS — D631 Anemia in chronic kidney disease: Secondary | ICD-10-CM | POA: Diagnosis not present

## 2018-08-03 DIAGNOSIS — R197 Diarrhea, unspecified: Secondary | ICD-10-CM | POA: Diagnosis not present

## 2018-08-03 DIAGNOSIS — R52 Pain, unspecified: Secondary | ICD-10-CM | POA: Diagnosis not present

## 2018-08-03 DIAGNOSIS — E876 Hypokalemia: Secondary | ICD-10-CM | POA: Diagnosis not present

## 2018-08-04 ENCOUNTER — Encounter: Payer: Self-pay | Admitting: Student

## 2018-08-04 ENCOUNTER — Ambulatory Visit (INDEPENDENT_AMBULATORY_CARE_PROVIDER_SITE_OTHER): Payer: Medicare Other | Admitting: Student

## 2018-08-04 VITALS — BP 136/50 | HR 88 | Ht 65.0 in | Wt 137.0 lb

## 2018-08-04 DIAGNOSIS — Z794 Long term (current) use of insulin: Secondary | ICD-10-CM | POA: Diagnosis not present

## 2018-08-04 DIAGNOSIS — I872 Venous insufficiency (chronic) (peripheral): Secondary | ICD-10-CM | POA: Diagnosis not present

## 2018-08-04 DIAGNOSIS — Z7901 Long term (current) use of anticoagulants: Secondary | ICD-10-CM | POA: Diagnosis not present

## 2018-08-04 DIAGNOSIS — I739 Peripheral vascular disease, unspecified: Secondary | ICD-10-CM | POA: Diagnosis not present

## 2018-08-04 DIAGNOSIS — M150 Primary generalized (osteo)arthritis: Secondary | ICD-10-CM | POA: Diagnosis not present

## 2018-08-04 DIAGNOSIS — E11319 Type 2 diabetes mellitus with unspecified diabetic retinopathy without macular edema: Secondary | ICD-10-CM | POA: Diagnosis not present

## 2018-08-04 DIAGNOSIS — I4892 Unspecified atrial flutter: Secondary | ICD-10-CM | POA: Diagnosis not present

## 2018-08-04 DIAGNOSIS — M329 Systemic lupus erythematosus, unspecified: Secondary | ICD-10-CM | POA: Diagnosis not present

## 2018-08-04 DIAGNOSIS — I10 Essential (primary) hypertension: Secondary | ICD-10-CM

## 2018-08-04 DIAGNOSIS — Z4781 Encounter for orthopedic aftercare following surgical amputation: Secondary | ICD-10-CM | POA: Diagnosis not present

## 2018-08-04 DIAGNOSIS — J449 Chronic obstructive pulmonary disease, unspecified: Secondary | ICD-10-CM | POA: Diagnosis not present

## 2018-08-04 DIAGNOSIS — M109 Gout, unspecified: Secondary | ICD-10-CM | POA: Diagnosis not present

## 2018-08-04 DIAGNOSIS — Z992 Dependence on renal dialysis: Secondary | ICD-10-CM | POA: Diagnosis not present

## 2018-08-04 DIAGNOSIS — Z87891 Personal history of nicotine dependence: Secondary | ICD-10-CM | POA: Diagnosis not present

## 2018-08-04 DIAGNOSIS — E785 Hyperlipidemia, unspecified: Secondary | ICD-10-CM | POA: Diagnosis not present

## 2018-08-04 DIAGNOSIS — Z89611 Acquired absence of right leg above knee: Secondary | ICD-10-CM | POA: Diagnosis not present

## 2018-08-04 DIAGNOSIS — N186 End stage renal disease: Secondary | ICD-10-CM

## 2018-08-04 DIAGNOSIS — M1990 Unspecified osteoarthritis, unspecified site: Secondary | ICD-10-CM | POA: Diagnosis not present

## 2018-08-04 DIAGNOSIS — E1142 Type 2 diabetes mellitus with diabetic polyneuropathy: Secondary | ICD-10-CM | POA: Diagnosis not present

## 2018-08-04 DIAGNOSIS — H409 Unspecified glaucoma: Secondary | ICD-10-CM | POA: Diagnosis not present

## 2018-08-04 DIAGNOSIS — Z86718 Personal history of other venous thrombosis and embolism: Secondary | ICD-10-CM | POA: Diagnosis not present

## 2018-08-04 DIAGNOSIS — D631 Anemia in chronic kidney disease: Secondary | ICD-10-CM | POA: Diagnosis not present

## 2018-08-04 DIAGNOSIS — I471 Supraventricular tachycardia: Secondary | ICD-10-CM | POA: Diagnosis not present

## 2018-08-04 DIAGNOSIS — E1151 Type 2 diabetes mellitus with diabetic peripheral angiopathy without gangrene: Secondary | ICD-10-CM | POA: Diagnosis not present

## 2018-08-04 DIAGNOSIS — I129 Hypertensive chronic kidney disease with stage 1 through stage 4 chronic kidney disease, or unspecified chronic kidney disease: Secondary | ICD-10-CM | POA: Diagnosis not present

## 2018-08-04 DIAGNOSIS — E1122 Type 2 diabetes mellitus with diabetic chronic kidney disease: Secondary | ICD-10-CM | POA: Diagnosis not present

## 2018-08-04 DIAGNOSIS — I4891 Unspecified atrial fibrillation: Secondary | ICD-10-CM | POA: Diagnosis not present

## 2018-08-04 MED ORDER — AMIODARONE HCL 200 MG PO TABS: 200.0000 mg | ORAL_TABLET | Freq: Every day | ORAL | 3 refills | Status: AC

## 2018-08-04 NOTE — Patient Instructions (Signed)
Medication Instructions:  Your physician recommends that you continue on your current medications as directed. Please refer to the Current Medication list given to you today.  If you need a refill on your cardiac medications before your next appointment, please call your pharmacy.   Lab work: NONE   If you have labs (blood work) drawn today and your tests are completely normal, you will receive your results only by: . MyChart Message (if you have MyChart) OR . A paper copy in the mail If you have any lab test that is abnormal or we need to change your treatment, we will call you to review the results.  Testing/Procedures: NONE   Follow-Up: At [facility], you and your health needs are our priority.  As part of our continuing mission to provide you with exceptional heart care, we have created designated Provider Care Teams.  These Care Teams include your primary Cardiologist (physician) and Advanced Practice Providers (APPs -  Physician Assistants and Nurse Practitioners) who all work together to provide you with the care you need, when you need it. You will need a follow up appointment in 3 months.  Please call our office 2 months in advance to schedule this appointment.  You may see Samuel McDowell, MD or one of the following Advanced Practice Providers on your designated Care Team:   Brittany Strader, PA-C ([place] Office) . Michele Lenze, PA-C ([place] Office)  Any Other Special Instructions Will Be Listed Below (If Applicable). Thank you for choosing [facility]!     

## 2018-08-04 NOTE — Progress Notes (Signed)
Cardiology Office Note    Date:  08/04/2018   ID:  [name], DOB 09-22-1946, MRN [id number]  PCP:  Sharilyn Sites, MD  Cardiologist: Rozann Lesches, MD    Chief Complaint  Patient presents with  . Hospitalization Follow-up    History of Present Illness:    [name] is a 72 y.o. female with past medical history of paroxysmal atrial fibrillation (on Eliquis for anticoagulation - previously on Coumadin but discontinued by Nephrology due to calciphylaxis), ESRD, PVD (s/p right AKA), HTN, IDDM, and history of DVT who presents to the office today for hospital follow-up.  She was admitted to [facility] in 05/2018 for a planned right AKA in the setting of nonhealing lower extremity wounds.  She tolerated the procedure without any immediate complications and was transferred to inpatient rehab.  While there, she was noted to have occasional episodes of a tachyarrhythmia.  She was evaluated by Dr. Percival Spanish during admission and given that she was overall asymptomatic, it was recommended that she continue on Eliquis for anticoagulation and Lopressor for rate control which was being held on HD days given episodes of hypotension.  An outpatient event monitor was recommended for further assessment. While the full monitor has not yet resulted as this was sent back on 08/01/2018, she has been noted to have frequent episodes of sustained SVT, probable aberrantly conducted SVT with wide complexes, and likely atypical atrial flutter.  This was reviewed with Dr. Domenic Polite on 07/05/2018 and it was recommended that she start Amiodarone 200 mg twice daily for the next 2 weeks and then reduce to 200 mg once daily.  In the interim, she presented to [facility] ED on 07/29/2018 with generalized weakness. Was found to be hypoglycemic with glucose at 58 while in the ED. She was admitted for further management of this as her episodes of hypoglycemia were thought to be due to poor oral intake and Lantus  dosing was reduced at the time of discharge. EKG at the time of admission did show normal sinus rhythm and by review of telemetry she was in normal sinus rhythm during admission. She was continued on Amiodarone, Lopressor, and Eliquis throughout admission and at discharge.   In talking with the patient today, she reports overall doing well from a cardiac perspective since her surgery. She reports being overall asymptomatic with her arrhythmia and was unaware of any episodes of tachycardia during admission or while wearing her event monitor. Her main concerning symptom at this time is pain along her left leg. She is being followed closely by Vascular Surgery and is unsure if she will require amputation of her left foot in the future.  She denies any recent chest pain, dyspnea on exertion, orthopnea, PND, or lower extremity edema. She undergoes hemodialysis on T/TH/SAT. Reports BP has overall been stable during her sessions as she holds Lopressor surrounding HD.    Past Medical History:  Diagnosis Date  . Anemia   . Arthritis   . Asthma   . Atrial fibrillation ([place])   . Atrial flutter ([place])    a. s/p TEE/DCCV December 2013 b. 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 Bone spur of toe    Right 5th toe  . Chronic diastolic CHF (congestive heart failure) (HCC)    a. EF 50-55% by echo in 2015  . Colon  polyposis   . COPD (chronic obstructive pulmonary disease) ([place])   . DVT of upper extremity (deep vein thrombosis) ([place])    Right basilic vein, June 5852  . ESRD (end stage renal disease) on dialysis ([place])   . Essential hypertension, benign   . Fractures   . Glaucoma   . Lupus ([employer])   . Pericarditis   . SLE (systemic lupus erythematosus) ([place])   . Type 2 diabetes mellitus ([place])   . UTI (lower urinary tract infection)   .  Wound of right leg 06/2017    Past Surgical History:  Procedure Laterality Date  . ABDOMINAL AORTOGRAM W/LOWER EXTREMITY N/A 12/18/2017   Procedure: ABDOMINAL AORTOGRAM W/LOWER EXTREMITY;  Surgeon: Elam Dutch, MD;  Location: [facility];  Service: Cardiovascular;  Laterality: N/A;  bilateral  . ABDOMINAL HYSTERECTOMY  1983  . AMPUTATION Right 06/08/2018   Procedure: RIGHT ABOVE KNEE AMPUTATION;  Surgeon: Elam Dutch, MD;  Location: [place];  Service: Vascular;  Laterality: Right;  . ANKLE SURGERY  1993  . AV FISTULA PLACEMENT Left 03/27/2014   Procedure: ARTERIOVENOUS (AV) FISTULA CREATION;  Surgeon: Rosetta Posner, MD;  Location: [place];  Service: Vascular;  Laterality: Left;  . BACK SURGERY  1980  . BASCILIC VEIN TRANSPOSITION Left 01/13/2018   Procedure: LEFT BASILIC VEIN TRANSPOSITION SECOND STAGE;  Surgeon: Elam Dutch, MD;  Location: [place];  Service: Vascular;  Laterality: Left;  . CARDIOVASCULAR STRESS TEST  12/19/2009   no stress induced rhythm abnormalities, ekg negative for ischemia  . CARDIOVERSION  09/16/2012   Procedure: CARDIOVERSION;  Surgeon: Sanda Klein, MD;  Location: [facility];  Service: Cardiovascular;  Laterality: N/A;  . CATARACT EXTRACTION    . COLONOSCOPY  09/20/2012   Dr. [name]: multiple tubular adenomas   . COLONOSCOPY  2005   Dr. [name]. Polyps, path unknown   . COLONOSCOPY N/A 07/24/2016   Dr. [name]: 3 significant size polyps removed from the colon, tubular adenomas.  Next colonoscopy in October 2020.  Marland Kitchen ESOPHAGOGASTRODUODENOSCOPY  09/20/2012   Dr. [name]: duodenal erosion and possible resolving ulcer at the angularis   . ESOPHAGOGASTRODUODENOSCOPY N/A 03/24/2018   Dr. [name]: Mild erosive reflux esophagitis, erosive gastropathy and enteropathy fairly extensive, no H. pylori on biopsy.  Marland Kitchen EYE SURGERY    . IR FLUORO GUIDE CV LINE RIGHT  11/09/2017  . IR US GUIDE VASC ACCESS RIGHT  11/09/2017  . REVISON OF ARTERIOVENOUS FISTULA Left  03/15/2018   Procedure: REVISION OF Left arm BASILIC VEIN TRANSPOSITION;  Surgeon: Angelia Mould, MD;  Location: [place];  Service: Vascular;  Laterality: Left;  . TEE WITHOUT CARDIOVERSION  09/16/2012   Procedure: TRANSESOPHAGEAL ECHOCARDIOGRAM (TEE);  Surgeon: Sanda Klein, MD;  Location: [facility] END[facility];  Service: Cardiovascular;  Laterality: N/A;  . TRANSESOPHAGEAL ECHOCARDIOGRAM WITH CARDIOVERSION  09/16/2012   EF 60-65%, moderate LVH, moderate regurg of the aortic valve, LA moderately dilated    Current Medications: Outpatient Medications Prior to Visit  Medication Sig Dispense Refill  . acetaminophen (TYLENOL) 325 MG tablet Take 1-2 tablets (325-650 mg total) by mouth every 4 (four) hours as needed for mild pain. (Patient taking differently: Take 1,000 mg by mouth daily as needed for mild pain or moderate pain. )    . albuterol (PROVENTIL HFA;VENTOLIN HFA) 108 (90 BASE) MCG/ACT inhaler Inhale 2 puffs into the lungs every 6 (six) hours as needed for shortness of breath.     . allopurinol (ZYLOPRIM) 100 MG tablet Take 100  mg by mouth every Monday, Wednesday, and Friday.     . Amino Acids-Protein Hydrolys (FEEDING SUPPLEMENT, PRO-STAT SUGAR FREE 64,) LIQD Take 30 mLs by mouth 2 (two) times daily. 900 mL 0  . apixaban (ELIQUIS) 5 MG TABS tablet Take 1 tablet (5 mg total) by mouth 2 (two) times daily. 60 tablet 6  . atorvastatin (LIPITOR) 20 MG tablet Take 20 mg by mouth at bedtime.     . B Complex-C-Folic Acid (DIALYVITE 517) 0.8 MG TABS Take 1 tablet by mouth daily.    . brimonidine-timolol (COMBIGAN) 0.2-0.5 % ophthalmic solution Place 1 drop into the left eye 2 (two) times daily.     . calcium acetate (PHOSLO) 667 MG capsule Take 667 mg by mouth 3 (three) times daily with meals.   4  . fentaNYL (DURAGESIC - DOSED MCG/HR) 50 MCG/HR Place 25 mcg onto the skin every 3 (three) days.     . folic acid (FOLVITE) 1 MG tablet Take 1 mg by mouth daily.    . hydroxychloroquine (PLAQUENIL) 200  MG tablet Take 200 mg by mouth daily.     . Insulin Detemir (LEVEMIR FLEXTOUCH) 100 UNIT/ML Pen Inject 4 Units into the skin every morning. 15 mL 11  . insulin lispro (HUMALOG KWIKPEN) 100 UNIT/ML KiwkPen Inject 5 Units into the skin 3 (three) times daily with meals as needed (*Only take if blood sugar levels are over 100).     . metoprolol tartrate (LOPRESSOR) 25 MG tablet Take 1/2 tablet twice a day on Mondays, Wednesdays and Fridays (Patient taking differently: Take 25 mg by mouth See admin instructions. Take 12.5 mg twice a day on Mondays, Wednesdays and Fridays) 30 tablet 0  . oxyCODONE-acetaminophen (PERCOCET/ROXICET) 5-325 MG tablet Take 1 tablet by mouth every 6 (six) hours as needed for severe pain. (Patient taking differently: Take 1 tablet by mouth 2 (two) times daily. ) 28 tablet 0  . pantoprazole (PROTONIX) 40 MG tablet Take 1 tablet (40 mg total) by mouth daily. 30 tablet 11  . predniSONE (DELTASONE) 5 MG tablet Take 5 mg by mouth daily with breakfast.    . travoprost, benzalkonium, (TRAVATAN) 0.004 % ophthalmic solution Place 1 drop into both eyes at bedtime.     Marland Kitchen amiodarone (PACERONE) 200 MG tablet Take 200 mg twice a day for 2 weeks, THEN take 200 mg daily thereafter (Patient taking differently: Take 200 mg by mouth daily. Take 200 mg twice a day for 2 weeks, THEN take 200 mg daily thereafter) 45 tablet 6  . metroNIDAZOLE (FLAGYL) 500 MG tablet Take 1 tablet (500 mg total) by mouth 2 (two) times daily. For patient and partner, (expedited partner therapy) (Patient not taking: Reported on 07/29/2018) 28 tablet 0   No facility-administered medications prior to visit.      Allergies:   Ace inhibitors   Social History   Socioeconomic History  . Marital status: Married    Spouse name: Not on file  . Number of children: Not on file  . Years of education: Not on file  . Highest education level: Not on file  Occupational History  . Not on file  Social Needs  . Financial resource  strain: Not on file  . Food insecurity:    Worry: Not on file    Inability: Not on file  . Transportation needs:    Medical: Not on file    Non-medical: Not on file  Tobacco Use  . Smoking status: Former Smoker    Packs/day:  1.00    Years: 30.00    Pack years: 30.00    Types: Cigarettes    Last attempt to quit: 08/29/2012    Years since quitting: 5.9  . Smokeless tobacco: Never Used  . Tobacco comment: quit 08-2012  Substance and Sexual Activity  . Alcohol use: No    Alcohol/week: 0.0 standard drinks  . Drug use: No  . Sexual activity: Yes    Partners: Male    Birth control/protection: Surgical    Comment: hyst  Lifestyle  . Physical activity:    Days per week: Not on file    Minutes per session: Not on file  . Stress: Not on file  Relationships  . Social connections:    Talks on phone: Not on file    Gets together: Not on file    Attends religious service: Not on file    Active member of club or organization: Not on file    Attends meetings of clubs or organizations: Not on file    Relationship status: Not on file  Other Topics Concern  . Not on file  Social History Narrative  . Not on file     Family History:  The patient's family history includes Diabetes in her brother, father, and sister; Hypertension in her brother, father, and sister; Stroke in her mother.   Review of Systems:   Please see the history of present illness.     General:  No chills, fever, night sweats or weight changes. Positive for fatigue and left foot pain.  Cardiovascular:  No chest pain, dyspnea on exertion, edema, orthopnea, palpitations, paroxysmal nocturnal dyspnea. Dermatological: No rash, lesions/masses Respiratory: No cough, dyspnea Urologic: No hematuria, dysuria Abdominal:   No nausea, vomiting, diarrhea, bright red blood per rectum, melena, or hematemesis Neurologic:  No visual changes, wkns, changes in mental status.  All other systems reviewed and are otherwise negative  except as noted above.   Physical Exam:    VS:  BP (!) 136/50   Pulse 88   Ht 5\' 5"  (1.651 m)   Wt 137 lb (62.1 kg) Comment: per pt  SpO2 98%   BMI 22.80 kg/m    General: Well developed, elderly African American female appearing in no acute distress. Sitting in wheelchair.  Head: Normocephalic, atraumatic, sclera non-icteric, no xanthomas, nares are without discharge.  Neck: No carotid bruits. JVD not elevated.  Lungs: Respirations regular and unlabored, without wheezes or rales.  Heart: Regular rate and rhythm. No S3 or S4.  No murmur, no rubs, or gallops appreciated. Abdomen: Soft, non-tender, non-distended with normoactive bowel sounds. No hepatomegaly. No rebound/guarding. No obvious abdominal masses. Msk:  Strength and tone appear normal for age. No joint deformities or effusions. Extremities: No clubbing or cyanosis. No lower extremity edema.  Right AKA. Left foot wrapped in ACE bandage.  Neuro: Alert and oriented X 3. Moves all extremities spontaneously. No focal deficits noted. Psych:  Responds to questions appropriately with a normal affect. Skin: No rashes or lesions noted  Wt Readings from Last 3 Encounters:  08/04/18 137 lb (62.1 kg)  07/30/18 139 lb 1.8 oz (63.1 kg)  07/07/18 137 lb (62.1 kg)     Studies/Labs Reviewed:   EKG:  EKG is not ordered today.   Recent Labs: 11/03/2017: B Natriuretic Peptide 3,320.0 11/06/2017: Magnesium 2.4 12/23/2017: TSH 2.111 07/29/2018: ALT 11 07/30/2018: BUN 50; Creatinine, Ser 4.41; Hemoglobin 11.3; Platelets 252; Potassium 4.2; Sodium 133   Lipid Panel No results found for: CHOL, TRIG,  HDL, CHOLHDL, VLDL, LDLCALC, LDLDIRECT  Additional studies/ records that were reviewed today include:   Echocardiogram: 10/2017 Study Conclusions  - Left ventricle: The cavity size was normal. Wall thickness was   increased in a pattern of moderate LVH. Systolic function was   normal. The estimated ejection fraction was in the range of 55%    to 60%. Wall motion was normal; there were no regional wall   motion abnormalities. Features are consistent with a pseudonormal   left ventricular filling pattern, with concomitant abnormal   relaxation and increased filling pressure (grade 2 diastolic   dysfunction). Doppler parameters are consistent with high   ventricular filling pressure. - Aortic valve: Moderately calcified annulus. Trileaflet; mildly   thickened, moderately calcified leaflets. Sclerosis without   stenosis. There was moderate regurgitation. - Mitral valve: Calcified annulus. Moderately calcified leaflets .   There was moderate regurgitation. - Left atrium: The atrium was severely dilated. - Right ventricle: Systolic function was moderately reduced. - Right atrium: The atrium was moderately dilated. - Tricuspid valve: Mildly thickened leaflets. There was moderate   regurgitation. - Pulmonary arteries: PA peak pressure: 63 mm Hg (S). - Inferior vena cava: The vessel was dilated. The respirophasic   diameter changes were blunted (< 50%), consistent with elevated   central venous pressure. Estimated CVP 15 mmHg.  Assessment:    1. Paroxysmal atrial flutter ([place])   2. SVT (supraventricular tachycardia) (HCC)   3. Current use of long term anticoagulation   4. PAD (peripheral artery disease) ([place])   5. Essential hypertension   6. ESRD (end stage renal disease) ([place])      Plan:   In order of problems listed above:  1. Paroxysmal Atrial Flutter/ SVT - the patient has a known history of atrial fibrillation and a recent event monitor was placed which showed frequent episodes of SVT, thought to be most consistent with atrial flutter. She did have some episodes of a wide-complex tachycardia which is felt to be most consistent with SVT with aberrancy. She was overall asymptomatic while wearing the monitor and while the full report is not available for review yet as she just returned this, strips are reviewed from the  [employer] website and her arrhythmia did significantly improve with initiation of Amiodarone on 07/05/2018. Will plan to continue at 200 mg daily along with Lopressor 12.5 mg on non-dialysis days.  2. Use of Long-Term Anticoagulation - She was previously on Coumadin for anticoagulation given her ESRD but this was stopped by Nephrology due to the concern of calciphylaxis. Data surrounding NOAC's with ESRD is limited but if not a candidate for Coumadin, options are limited. She has remained on Eliquis 5 mg twice daily and denies any evidence of active bleeding.  3. PAD - s/p right AKA in 05/2018. Being followed closely by Vascular as the patient is concerned she might require amputation of her left foot in the future due to dry gangrene. Has follow-up scheduled next week.  - Not on ASA given the need for anticoagulation. Continue statin therapy.   4. HTN - BP is well controlled at 136/50 during today's visit. Continue Lopressor 12.5 mg BID on nondialysis days.   5. ESRD - on HD - Tuesday, Thursday, Saturday.   Medication Adjustments/Labs and Tests Ordered: Current medicines are reviewed at length with the patient today.  Concerns regarding medicines are outlined above.  Medication changes, Labs and Tests ordered today are listed in the Patient Instructions below. Patient Instructions  Medication Instructions:  Your  physician recommends that you continue on your current medications as directed. Please refer to the Current Medication list given to you today.  If you need a refill on your cardiac medications before your next appointment, please call your pharmacy.   Lab work: NONE  If you have labs (blood work) drawn today and your tests are completely normal, you will receive your results only by: Marland Kitchen MyChart Message (if you have MyChart) OR . A paper copy in the mail If you have any lab test that is abnormal or we need to change your treatment, we will call you to review the  results.  Testing/Procedures: NONE   Follow-Up: At [facility], you and your health needs are our priority.  As part of our continuing mission to provide you with exceptional heart care, we have created designated Provider Care Teams.  These Care Teams include your primary Cardiologist (physician) and Advanced Practice Providers (APPs -  Physician Assistants and Nurse Practitioners) who all work together to provide you with the care you need, when you need it. You will need a follow up appointment in 3 months.  Please call our office 2 months in advance to schedule this appointment.  You may see Rozann Lesches, MD or one of the following Advanced Practice Providers on your designated Care Team:   Bernerd Pho, PA-C [facility]) . Ermalinda Barrios, PA-C ([place])  Any Other Special Instructions Will Be Listed Below (If Applicable). Thank you for choosing [facility]!     Signed, Erma Heritage, PA-C  08/04/2018 5:18 PM    [place] [address] [place], [place] [address] Phone: [phone]

## 2018-08-05 DIAGNOSIS — D689 Coagulation defect, unspecified: Secondary | ICD-10-CM | POA: Diagnosis not present

## 2018-08-05 DIAGNOSIS — N2581 Secondary hyperparathyroidism of renal origin: Secondary | ICD-10-CM | POA: Diagnosis not present

## 2018-08-05 DIAGNOSIS — R52 Pain, unspecified: Secondary | ICD-10-CM | POA: Diagnosis not present

## 2018-08-05 DIAGNOSIS — E1129 Type 2 diabetes mellitus with other diabetic kidney complication: Secondary | ICD-10-CM | POA: Diagnosis not present

## 2018-08-05 DIAGNOSIS — E876 Hypokalemia: Secondary | ICD-10-CM | POA: Diagnosis not present

## 2018-08-05 DIAGNOSIS — N186 End stage renal disease: Secondary | ICD-10-CM | POA: Diagnosis not present

## 2018-08-05 DIAGNOSIS — D631 Anemia in chronic kidney disease: Secondary | ICD-10-CM | POA: Diagnosis not present

## 2018-08-05 DIAGNOSIS — R197 Diarrhea, unspecified: Secondary | ICD-10-CM | POA: Diagnosis not present

## 2018-08-06 DIAGNOSIS — E162 Hypoglycemia, unspecified: Secondary | ICD-10-CM | POA: Diagnosis not present

## 2018-08-06 DIAGNOSIS — N186 End stage renal disease: Secondary | ICD-10-CM | POA: Diagnosis not present

## 2018-08-06 DIAGNOSIS — I509 Heart failure, unspecified: Secondary | ICD-10-CM | POA: Diagnosis not present

## 2018-08-06 DIAGNOSIS — J449 Chronic obstructive pulmonary disease, unspecified: Secondary | ICD-10-CM | POA: Diagnosis not present

## 2018-08-06 DIAGNOSIS — M1990 Unspecified osteoarthritis, unspecified site: Secondary | ICD-10-CM | POA: Diagnosis not present

## 2018-08-06 DIAGNOSIS — G894 Chronic pain syndrome: Secondary | ICD-10-CM | POA: Diagnosis not present

## 2018-08-06 DIAGNOSIS — I998 Other disorder of circulatory system: Secondary | ICD-10-CM | POA: Diagnosis not present

## 2018-08-06 DIAGNOSIS — I10 Essential (primary) hypertension: Secondary | ICD-10-CM | POA: Diagnosis not present

## 2018-08-07 DIAGNOSIS — N2581 Secondary hyperparathyroidism of renal origin: Secondary | ICD-10-CM | POA: Diagnosis not present

## 2018-08-07 DIAGNOSIS — N186 End stage renal disease: Secondary | ICD-10-CM | POA: Diagnosis not present

## 2018-08-07 DIAGNOSIS — D631 Anemia in chronic kidney disease: Secondary | ICD-10-CM | POA: Diagnosis not present

## 2018-08-07 DIAGNOSIS — R197 Diarrhea, unspecified: Secondary | ICD-10-CM | POA: Diagnosis not present

## 2018-08-07 DIAGNOSIS — E1129 Type 2 diabetes mellitus with other diabetic kidney complication: Secondary | ICD-10-CM | POA: Diagnosis not present

## 2018-08-07 DIAGNOSIS — D689 Coagulation defect, unspecified: Secondary | ICD-10-CM | POA: Diagnosis not present

## 2018-08-07 DIAGNOSIS — E876 Hypokalemia: Secondary | ICD-10-CM | POA: Diagnosis not present

## 2018-08-07 DIAGNOSIS — R52 Pain, unspecified: Secondary | ICD-10-CM | POA: Diagnosis not present

## 2018-08-09 ENCOUNTER — Encounter: Payer: Self-pay | Admitting: Gastroenterology

## 2018-08-09 ENCOUNTER — Other Ambulatory Visit: Payer: Self-pay | Admitting: "Endocrinology

## 2018-08-09 DIAGNOSIS — Z794 Long term (current) use of insulin: Secondary | ICD-10-CM | POA: Diagnosis not present

## 2018-08-09 DIAGNOSIS — Z87891 Personal history of nicotine dependence: Secondary | ICD-10-CM | POA: Diagnosis not present

## 2018-08-09 DIAGNOSIS — H409 Unspecified glaucoma: Secondary | ICD-10-CM | POA: Diagnosis not present

## 2018-08-09 DIAGNOSIS — Z86718 Personal history of other venous thrombosis and embolism: Secondary | ICD-10-CM | POA: Diagnosis not present

## 2018-08-09 DIAGNOSIS — J449 Chronic obstructive pulmonary disease, unspecified: Secondary | ICD-10-CM | POA: Diagnosis not present

## 2018-08-09 DIAGNOSIS — E1142 Type 2 diabetes mellitus with diabetic polyneuropathy: Secondary | ICD-10-CM | POA: Diagnosis not present

## 2018-08-09 DIAGNOSIS — M109 Gout, unspecified: Secondary | ICD-10-CM | POA: Diagnosis not present

## 2018-08-09 DIAGNOSIS — N184 Chronic kidney disease, stage 4 (severe): Secondary | ICD-10-CM | POA: Diagnosis not present

## 2018-08-09 DIAGNOSIS — E11319 Type 2 diabetes mellitus with unspecified diabetic retinopathy without macular edema: Secondary | ICD-10-CM | POA: Diagnosis not present

## 2018-08-09 DIAGNOSIS — Z992 Dependence on renal dialysis: Secondary | ICD-10-CM | POA: Diagnosis not present

## 2018-08-09 DIAGNOSIS — E1122 Type 2 diabetes mellitus with diabetic chronic kidney disease: Secondary | ICD-10-CM | POA: Diagnosis not present

## 2018-08-09 DIAGNOSIS — N186 End stage renal disease: Secondary | ICD-10-CM | POA: Diagnosis not present

## 2018-08-09 DIAGNOSIS — M329 Systemic lupus erythematosus, unspecified: Secondary | ICD-10-CM | POA: Diagnosis not present

## 2018-08-09 DIAGNOSIS — E1140 Type 2 diabetes mellitus with diabetic neuropathy, unspecified: Secondary | ICD-10-CM | POA: Diagnosis not present

## 2018-08-09 DIAGNOSIS — I872 Venous insufficiency (chronic) (peripheral): Secondary | ICD-10-CM | POA: Diagnosis not present

## 2018-08-09 DIAGNOSIS — D631 Anemia in chronic kidney disease: Secondary | ICD-10-CM | POA: Diagnosis not present

## 2018-08-09 DIAGNOSIS — I4891 Unspecified atrial fibrillation: Secondary | ICD-10-CM | POA: Diagnosis not present

## 2018-08-09 DIAGNOSIS — M1990 Unspecified osteoarthritis, unspecified site: Secondary | ICD-10-CM | POA: Diagnosis not present

## 2018-08-09 DIAGNOSIS — Z4781 Encounter for orthopedic aftercare following surgical amputation: Secondary | ICD-10-CM | POA: Diagnosis not present

## 2018-08-09 DIAGNOSIS — Z89611 Acquired absence of right leg above knee: Secondary | ICD-10-CM | POA: Diagnosis not present

## 2018-08-09 DIAGNOSIS — E785 Hyperlipidemia, unspecified: Secondary | ICD-10-CM | POA: Diagnosis not present

## 2018-08-09 DIAGNOSIS — I129 Hypertensive chronic kidney disease with stage 1 through stage 4 chronic kidney disease, or unspecified chronic kidney disease: Secondary | ICD-10-CM | POA: Diagnosis not present

## 2018-08-09 DIAGNOSIS — E1151 Type 2 diabetes mellitus with diabetic peripheral angiopathy without gangrene: Secondary | ICD-10-CM | POA: Diagnosis not present

## 2018-08-09 DIAGNOSIS — M150 Primary generalized (osteo)arthritis: Secondary | ICD-10-CM | POA: Diagnosis not present

## 2018-08-10 DIAGNOSIS — N186 End stage renal disease: Secondary | ICD-10-CM | POA: Diagnosis not present

## 2018-08-10 DIAGNOSIS — E1129 Type 2 diabetes mellitus with other diabetic kidney complication: Secondary | ICD-10-CM | POA: Diagnosis not present

## 2018-08-10 DIAGNOSIS — R52 Pain, unspecified: Secondary | ICD-10-CM | POA: Diagnosis not present

## 2018-08-10 DIAGNOSIS — R197 Diarrhea, unspecified: Secondary | ICD-10-CM | POA: Diagnosis not present

## 2018-08-10 DIAGNOSIS — N2581 Secondary hyperparathyroidism of renal origin: Secondary | ICD-10-CM | POA: Diagnosis not present

## 2018-08-10 DIAGNOSIS — D631 Anemia in chronic kidney disease: Secondary | ICD-10-CM | POA: Diagnosis not present

## 2018-08-10 DIAGNOSIS — E876 Hypokalemia: Secondary | ICD-10-CM | POA: Diagnosis not present

## 2018-08-10 DIAGNOSIS — D689 Coagulation defect, unspecified: Secondary | ICD-10-CM | POA: Diagnosis not present

## 2018-08-10 LAB — COMPREHENSIVE METABOLIC PANEL
A/G RATIO: 0.7 — AB (ref 1.2–2.2)
ALK PHOS: 76 IU/L (ref 39–117)
ALT: 11 IU/L (ref 0–32)
AST: 17 IU/L (ref 0–40)
Albumin: 2.5 g/dL — ABNORMAL LOW (ref 3.5–4.8)
BUN/Creatinine Ratio: 15 (ref 12–28)
BUN: 67 mg/dL — ABNORMAL HIGH (ref 8–27)
Bilirubin Total: 0.2 mg/dL (ref 0.0–1.2)
CHLORIDE: 99 mmol/L (ref 96–106)
CO2: 21 mmol/L (ref 20–29)
Calcium: 8.4 mg/dL — ABNORMAL LOW (ref 8.7–10.3)
Creatinine, Ser: 4.38 mg/dL — ABNORMAL HIGH (ref 0.57–1.00)
GFR calc Af Amer: 11 mL/min/{1.73_m2} — ABNORMAL LOW (ref >59)
GFR calc non Af Amer: 9 mL/min/{1.73_m2} — ABNORMAL LOW (ref >59)
Globulin, Total: 3.5 g/dL (ref 1.5–4.5)
Glucose: 149 mg/dL — ABNORMAL HIGH (ref 65–99)
POTASSIUM: 4.4 mmol/L (ref 3.5–5.2)
SODIUM: 140 mmol/L (ref 134–144)
Total Protein: 6 g/dL (ref 6.0–8.5)

## 2018-08-10 LAB — SPECIMEN STATUS REPORT

## 2018-08-10 LAB — HGB A1C W/O EAG: HEMOGLOBIN A1C: 5 % (ref 4.8–5.6)

## 2018-08-11 ENCOUNTER — Ambulatory Visit (HOSPITAL_COMMUNITY)
Admission: RE | Admit: 2018-08-11 | Discharge: 2018-08-11 | Disposition: A | Discharge status: Home / Self Care | Payer: Medicare Other | Source: Ambulatory Visit | Attending: Vascular Surgery | Admitting: Vascular Surgery

## 2018-08-11 ENCOUNTER — Ambulatory Visit: Payer: Medicare Other | Admitting: Vascular Surgery

## 2018-08-11 ENCOUNTER — Other Ambulatory Visit: Payer: Self-pay

## 2018-08-11 ENCOUNTER — Ambulatory Visit (INDEPENDENT_AMBULATORY_CARE_PROVIDER_SITE_OTHER): Payer: Self-pay | Admitting: Vascular Surgery

## 2018-08-11 ENCOUNTER — Encounter: Payer: Self-pay | Admitting: Vascular Surgery

## 2018-08-11 ENCOUNTER — Encounter: Payer: Self-pay | Admitting: *Deleted

## 2018-08-11 ENCOUNTER — Encounter (HOSPITAL_COMMUNITY): Payer: Medicare Other

## 2018-08-11 VITALS — BP 125/65 | HR 69 | Temp 97.7°F | Resp 16 | Ht 65.0 in | Wt 137.0 lb

## 2018-08-11 DIAGNOSIS — I4891 Unspecified atrial fibrillation: Secondary | ICD-10-CM | POA: Diagnosis not present

## 2018-08-11 DIAGNOSIS — M329 Systemic lupus erythematosus, unspecified: Secondary | ICD-10-CM | POA: Diagnosis not present

## 2018-08-11 DIAGNOSIS — M1990 Unspecified osteoarthritis, unspecified site: Secondary | ICD-10-CM | POA: Diagnosis not present

## 2018-08-11 DIAGNOSIS — Z89611 Acquired absence of right leg above knee: Secondary | ICD-10-CM | POA: Diagnosis not present

## 2018-08-11 DIAGNOSIS — Z87891 Personal history of nicotine dependence: Secondary | ICD-10-CM | POA: Diagnosis not present

## 2018-08-11 DIAGNOSIS — M109 Gout, unspecified: Secondary | ICD-10-CM | POA: Diagnosis not present

## 2018-08-11 DIAGNOSIS — Z86718 Personal history of other venous thrombosis and embolism: Secondary | ICD-10-CM | POA: Diagnosis not present

## 2018-08-11 DIAGNOSIS — D631 Anemia in chronic kidney disease: Secondary | ICD-10-CM | POA: Diagnosis not present

## 2018-08-11 DIAGNOSIS — E1142 Type 2 diabetes mellitus with diabetic polyneuropathy: Secondary | ICD-10-CM | POA: Diagnosis not present

## 2018-08-11 DIAGNOSIS — Z4781 Encounter for orthopedic aftercare following surgical amputation: Secondary | ICD-10-CM | POA: Diagnosis not present

## 2018-08-11 DIAGNOSIS — E11319 Type 2 diabetes mellitus with unspecified diabetic retinopathy without macular edema: Secondary | ICD-10-CM | POA: Diagnosis not present

## 2018-08-11 DIAGNOSIS — Z794 Long term (current) use of insulin: Secondary | ICD-10-CM | POA: Diagnosis not present

## 2018-08-11 DIAGNOSIS — I739 Peripheral vascular disease, unspecified: Secondary | ICD-10-CM | POA: Insufficient documentation

## 2018-08-11 DIAGNOSIS — E1122 Type 2 diabetes mellitus with diabetic chronic kidney disease: Secondary | ICD-10-CM | POA: Diagnosis not present

## 2018-08-11 DIAGNOSIS — N186 End stage renal disease: Secondary | ICD-10-CM | POA: Diagnosis not present

## 2018-08-11 DIAGNOSIS — M150 Primary generalized (osteo)arthritis: Secondary | ICD-10-CM | POA: Diagnosis not present

## 2018-08-11 DIAGNOSIS — J449 Chronic obstructive pulmonary disease, unspecified: Secondary | ICD-10-CM | POA: Diagnosis not present

## 2018-08-11 DIAGNOSIS — Z992 Dependence on renal dialysis: Secondary | ICD-10-CM | POA: Diagnosis not present

## 2018-08-11 DIAGNOSIS — I129 Hypertensive chronic kidney disease with stage 1 through stage 4 chronic kidney disease, or unspecified chronic kidney disease: Secondary | ICD-10-CM | POA: Diagnosis not present

## 2018-08-11 DIAGNOSIS — E1151 Type 2 diabetes mellitus with diabetic peripheral angiopathy without gangrene: Secondary | ICD-10-CM | POA: Diagnosis not present

## 2018-08-11 DIAGNOSIS — I872 Venous insufficiency (chronic) (peripheral): Secondary | ICD-10-CM | POA: Diagnosis not present

## 2018-08-11 DIAGNOSIS — H409 Unspecified glaucoma: Secondary | ICD-10-CM | POA: Diagnosis not present

## 2018-08-11 DIAGNOSIS — E785 Hyperlipidemia, unspecified: Secondary | ICD-10-CM | POA: Diagnosis not present

## 2018-08-11 NOTE — Progress Notes (Addendum)
[facility] Note  08/13/2018     CHIEF COMPLAINT Patient presents for Retina Follow Up   HISTORY OF PRESENT ILLNESS: [name] is a 72 y.o. female who presents to the clinic today for:   HPI    Retina Follow Up    Patient presents with  Diabetic Retinopathy.  In right eye.  This started 2 months ago.  Severity is mild.  Since onset it is gradually improving.  I, the attending physician,  performed the HPI with the patient and updated documentation appropriately.          Comments    F/U NPDR  OS W/ mac edema. Patient states her vision "has been doing good", Denies new visual onsets Bs 127 this am, Pt was hospitalized 07/30/18 for hypoglycemic episode. Pt states her BS have been WINL since then.        Last edited by Bernarda Caffey, MD on 08/16/2018 12:40 AM. (History)    pt states her vision is doing well, she states she is having half of her foot amputated on December 9th  Referring physician: Sharilyn Sites, MD [address] [place], [place] [address]  HISTORICAL INFORMATION:   Selected notes from the MEDICAL RECORD NUMBER Referred by Dr. Kathlen Mody for concern of BDR LEE: 04.26.19 Read Drivers) [BCVA: OD: 20/30 OS: 20/50+2 Ocular Hx-Glaucoma OU, Pseudophakia OU, s/p YAP PC OS, floaters OU, diabetic retinopathy, HTN Ret, hx of iritis PMH- DM, HTN, lupus, arthritis, asthma  Previous exams by Silvestre Gunner and Blanchie Serve at [place]: Current Outpatient Medications (Ophthalmic Drugs)  Medication Sig  . brimonidine-timolol (COMBIGAN) 0.2-0.5 % ophthalmic solution Place 1 drop into the left eye 2 (two) times daily.   . travoprost, benzalkonium, (TRAVATAN) 0.004 % ophthalmic solution Place 1 drop into both eyes at bedtime.    No current facility-administered medications for this visit.  (Ophthalmic Drugs)   Current Outpatient Medications (Other)  Medication Sig  . acetaminophen (TYLENOL) 325 MG tablet Take 1-2 tablets  (325-650 mg total) by mouth every 4 (four) hours as needed for mild pain. (Patient taking differently: Take 1,000 mg by mouth daily as needed for mild pain or moderate pain. )  . albuterol (PROVENTIL HFA;VENTOLIN HFA) 108 (90 BASE) MCG/ACT inhaler Inhale 2 puffs into the lungs every 6 (six) hours as needed for shortness of breath.   . allopurinol (ZYLOPRIM) 100 MG tablet Take 100 mg by mouth every Monday, Wednesday, and Friday.   . Amino Acids-Protein Hydrolys (FEEDING SUPPLEMENT, PRO-STAT SUGAR FREE 64,) LIQD Take 30 mLs by mouth 2 (two) times daily.  Marland Kitchen amiodarone (PACERONE) 200 MG tablet Take 1 tablet (200 mg total) by mouth daily.  Marland Kitchen apixaban (ELIQUIS) 5 MG TABS tablet Take 1 tablet (5 mg total) by mouth 2 (two) times daily.  Marland Kitchen atorvastatin (LIPITOR) 20 MG tablet Take 20 mg by mouth at bedtime.   . B Complex-C-Folic Acid (DIALYVITE 856) 0.8 MG TABS Take 1 tablet by mouth daily.  . calcium acetate (PHOSLO) 667 MG capsule Take 667 mg by mouth 3 (three) times daily with meals.   . fentaNYL (DURAGESIC - DOSED MCG/HR) 50 MCG/HR Place 25 mcg onto the skin every 3 (three) days.   . folic acid (FOLVITE) 1 MG tablet Take 1 mg by mouth daily.  . hydroxychloroquine (PLAQUENIL) 200 MG tablet Take 200 mg by mouth daily.   . Insulin Detemir (LEVEMIR FLEXTOUCH) 100 UNIT/ML Pen Inject 4 Units into the skin every  morning.  . insulin lispro (HUMALOG KWIKPEN) 100 UNIT/ML KiwkPen Inject 5 Units into the skin 3 (three) times daily with meals as needed (*Only take if blood sugar levels are over 100).   . metoprolol tartrate (LOPRESSOR) 25 MG tablet Take 1/2 tablet twice a day on Mondays, Wednesdays and Fridays (Patient taking differently: Take 25 mg by mouth See admin instructions. Take 12.5 mg twice a day on Mondays, Wednesdays and Fridays)  . Oxycodone HCl 10 MG TABS Take by mouth.  . pantoprazole (PROTONIX) 40 MG tablet Take 1 tablet (40 mg total) by mouth daily.  . predniSONE (DELTASONE) 5 MG tablet Take 5 mg by  mouth daily with breakfast.   No current facility-administered medications for this visit.  (Other)      REVIEW OF SYSTEMS: ROS    Positive for: Endocrine, Eyes   Negative for: Constitutional, Gastrointestinal, Neurological, Skin, Genitourinary, Musculoskeletal, HENT, Cardiovascular, Respiratory, Psychiatric, Allergic/Imm, Heme/Lymph   Last edited by Zenovia Jordan, LPN on 61/22/4497  5:30 PM. (History)       ALLERGIES Allergies  Allergen Reactions  . Ace Inhibitors Cough    PAST MEDICAL HISTORY Past Medical History:  Diagnosis Date  . Anemia   . Arthritis   . Asthma   . Atrial fibrillation ([place])   . Atrial flutter ([place])    a. s/p TEE/DCCV December 2013 b. 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 Bone spur of toe    Right 5th toe  . Chronic diastolic CHF (congestive heart failure) (HCC)    a. EF 50-55% by echo in 2015  . Colon polyposis   . COPD (chronic obstructive pulmonary disease) ([place])   . DVT of upper extremity (deep vein thrombosis) ([place])    Right basilic vein, June 0511  . ESRD (end stage renal disease) on dialysis ([place])   . Essential hypertension, benign   . Fractures   . Glaucoma   . Lupus ([place])   . Pericarditis   . SLE (systemic lupus erythematosus) ([place])   . Type 2 diabetes mellitus ([place])   . UTI (lower urinary tract infection)   . Wound of right leg 06/2017   Past Surgical History:  Procedure Laterality Date  . ABDOMINAL AORTOGRAM W/LOWER EXTREMITY N/A 12/18/2017   Procedure: ABDOMINAL AORTOGRAM W/LOWER EXTREMITY;  Surgeon: Elam Dutch, MD;  Location: [facility];  Service: Cardiovascular;  Laterality: N/A;  bilateral  . ABDOMINAL HYSTERECTOMY  1983  . AMPUTATION Right 06/08/2018   Procedure: RIGHT ABOVE KNEE AMPUTATION;  Surgeon: Elam Dutch, MD;  Location:  [facility];  Service: Vascular;  Laterality: Right;  . ANKLE SURGERY  1993  . AV FISTULA PLACEMENT Left 03/27/2014   Procedure: ARTERIOVENOUS (AV) FISTULA CREATION;  Surgeon: Rosetta Posner, MD;  Location: [facility];  Service: Vascular;  Laterality: Left;  . BACK SURGERY  1980  . BASCILIC VEIN TRANSPOSITION Left 01/13/2018   Procedure: LEFT BASILIC VEIN TRANSPOSITION SECOND STAGE;  Surgeon: Elam Dutch, MD;  Location: [facility];  Service: Vascular;  Laterality: Left;  . CARDIOVASCULAR STRESS TEST  12/19/2009   no stress induced rhythm abnormalities, ekg negative for ischemia  . CARDIOVERSION  09/16/2012   Procedure: CARDIOVERSION;  Surgeon: Sanda Klein, MD;  Location: [facility];  Service: Cardiovascular;  Laterality: N/A;  . CATARACT EXTRACTION    . COLONOSCOPY  09/20/2012  Dr. [name]: multiple tubular adenomas   . COLONOSCOPY  2005   Dr. [name]. Polyps, path unknown   . COLONOSCOPY N/A 07/24/2016   Dr. [name]: 3 significant size polyps removed from the colon, tubular adenomas.  Next colonoscopy in October 2020.  Marland Kitchen ESOPHAGOGASTRODUODENOSCOPY  09/20/2012   Dr. [name]: duodenal erosion and possible resolving ulcer at the angularis   . ESOPHAGOGASTRODUODENOSCOPY N/A 03/24/2018   Dr. [name]: Mild erosive reflux esophagitis, erosive gastropathy and enteropathy fairly extensive, no H. pylori on biopsy.  Marland Kitchen EYE SURGERY    . IR FLUORO GUIDE CV LINE RIGHT  11/09/2017  . IR US GUIDE VASC ACCESS RIGHT  11/09/2017  . REVISON OF ARTERIOVENOUS FISTULA Left 03/15/2018   Procedure: REVISION OF Left arm BASILIC VEIN TRANSPOSITION;  Surgeon: Angelia Mould, MD;  Location: [facility];  Service: Vascular;  Laterality: Left;  . TEE WITHOUT CARDIOVERSION  09/16/2012   Procedure: TRANSESOPHAGEAL ECHOCARDIOGRAM (TEE);  Surgeon: Sanda Klein, MD;  Location: [facility];  Service: Cardiovascular;  Laterality: N/A;  . TRANSESOPHAGEAL ECHOCARDIOGRAM WITH CARDIOVERSION  09/16/2012   EF 60-65%, moderate LVH, moderate  regurg of the aortic valve, LA moderately dilated    FAMILY HISTORY Family History  Problem Relation Age of Onset  . Diabetes Brother   . Diabetes Father   . Hypertension Father   . Hypertension Sister   . Stroke Mother   . Diabetes Sister   . Hypertension Brother   . Colon cancer Neg Hx     SOCIAL HISTORY Social History   Tobacco Use  . Smoking status: Former Smoker    Packs/day: 1.00    Years: 30.00    Pack years: 30.00    Types: Cigarettes    Last attempt to quit: 08/29/2012    Years since quitting: 5.9  . Smokeless tobacco: Never Used  . Tobacco comment: quit 08-2012  Substance Use Topics  . Alcohol use: No    Alcohol/week: 0.0 standard drinks  . Drug use: No         OPHTHALMIC EXAM:  Base Eye Exam    Visual Acuity (Snellen - Linear)      Right Left   Dist [place] 20/60 20/100 -1   Dist ph [place] 20/60 +2 20/60 -1  Pt forgot her glasses today       Tonometry (Tonopen, 1:18 PM)      Right Left   Pressure 17 17       Pupils      Dark Light Shape React APD   Right 4 3 Round Brisk None   Left 4 3 Round Brisk None       Extraocular Movement      Right Left    Full, Ortho Full, Ortho       Neuro/Psych    Oriented x3:  Yes   Mood/Affect:  Normal       Dilation    Both eyes:  1.0% Mydriacyl, 2.5% Phenylephrine @ 1:14 PM        Slit Lamp and Fundus Exam    Slit Lamp Exam      Right Left   Lids/Lashes Dermatochalasis - upper lid, mild Meibomian gland dysfunction Dermatochalasis - upper lid, mild Meibomian gland dysfunction   Conjunctiva/Sclera White and quiet White and quiet   Cornea Arcus Arcus   Anterior Chamber Deep and quiet Deep and quiet   Iris Round and dilated, No NVI Round and dilated, No NVI   Lens PC IOL in good position three piece sulcus IOL  with optic capture, open PC   Vitreous Vitreous syneresis empty - ?post vitrectomy       Fundus Exam      Right Left   Disc Pink and Sharp, no NVD Pink and Sharp, mild Pallor   C/D Ratio 0.2  0.25   Macula Blunted foveal reflex, Retinal pigment epithelial mottling, focal laser scars temporal to fovea, scattered Microaneurysms Good foveal reflex, Retinal pigment epithelial mottling, focal laser scars, rare Microaneurysms, mild Epiretinal membrane   Vessels Vascular attenuation, Tortuous, residual flat NV ST quad Vascular attenuation, Tortuous, AV crossing changes   Periphery Attached, scattered DBH; 360 PRP room for posterior fill in  Attached, scattered DBH, 360 PRP          IMAGING AND PROCEDURES  Imaging and Procedures for 02/01/18  OCT, Retina - OU - Both Eyes       Right Eye Quality was good. Central Foveal Thickness: 292. Progression has been stable. Findings include abnormal foveal contour, outer retinal atrophy, epiretinal membrane, vitreomacular adhesion , subretinal fluid, no IRF (Persistent central sliver of SRF).   Left Eye Quality was good. Central Foveal Thickness: 221. Progression has been stable. Findings include abnormal foveal contour, epiretinal membrane, no SRF, outer retinal atrophy, intraretinal fluid (Cystic changes temporal macula).   Notes *Images captured and stored on drive  Diagnosis / Impression:  OD: No DME, mild VMA with trace cystic changes, persistent central sliver of SRF--mild OS: ERM with abnormal contour, interval improvement in cystic changes  Clinical management:  See below  Abbreviations: NFP - Normal foveal profile. CME - cystoid macular edema. PED - pigment epithelial detachment. IRF - intraretinal fluid. SRF - subretinal fluid. EZ - ellipsoid zone. ERM - epiretinal membrane. ORA - outer retinal atrophy. ORT - outer retinal tubulation. SRHM - subretinal hyper-reflective material         Fluorescein Angiography Optos (Transit OS)       Right Eye   Progression has improved. Early phase findings include staining, retinal neovascularization, microaneurysm, vascular perfusion defect (Interval improvement in NV still some  residual leakage ST quadrant). Mid/Late phase findings include staining, leakage, microaneurysm, retinal neovascularization, vascular perfusion defect.   Left Eye   Progression has been stable. Early phase findings include microaneurysm, vascular perfusion defect, staining, window defect. Mid/Late phase findings include leakage, staining, window defect, microaneurysm, vascular perfusion defect.   Notes Images captured and stored on drive;   Impression: OD: PDR OS: Severe NPDR         Panretinal Photocoagulation - OD - Right Eye       LASER PROCEDURE NOTE  Diagnosis:   Proliferative Diabetic Retinopathy, RIGHT EYE  Procedure:  Pan-retinal photocoagulation using slit lamp laser, RIGHT EYE  Anesthesia:  Topical  Surgeon: Bernarda Caffey, MD, PhD   Informed consent obtained, operative eye marked, and time out performed prior to initiation of laser.   Lumenis [id number] slit lamp laser Pattern: 3x3 square Power: 240 mW Duration: 30 msec  Spot size: 200 microns  # spots: 450 spots posterior fill in mostly temporal  Complications: None.  RTC: 4-6 wks  Patient tolerated the procedure well and received written and verbal post-procedure care information/education.                  ASSESSMENT/PLAN:    ICD-10-CM   1. Proliferative diabetic retinopathy of right eye without macular edema associated with type 2 diabetes mellitus (HCC) Y07.3710 Fluorescein Angiography Optos (Transit OS)    Panretinal Photocoagulation - OD - Right Eye  2. Severe nonproliferative diabetic retinopathy of left eye without macular edema associated with type 2 diabetes mellitus (HCC) I29.7989 Fluorescein Angiography Optos (Transit OS)  3. Retinal edema H35.81 OCT, Retina - OU - Both Eyes  4. Epiretinal membrane (ERM) of left eye H35.372   5. Essential hypertension I10   6. Hypertensive retinopathy of both eyes H35.033 Fluorescein Angiography Optos (Transit OS)  7. Pseudophakia of both eyes  Z96.1     1-3.  PDR w/o macular edema OD  Severe NPDR w/o macular edema OS - former pt of Drs. Cordelia Pen and Kurup at [facility] - s/p focal laser and intravitreal injections - exam shows scattered [place] OU -- no NV - OCT without diabetic macular edema OD, mild noncentral edema OS - FA 8.13.19 shows leaking NV OD, scattered peripheral capillary nonperfusion OS - repeat FA 11.15.19 shows improved leakage / NV OD, but still with some focal NV with significant leakge - recommend PRP OD fill in today, 11.15.19 - RBA of procedure discussed, questions answered - informed consent obtained and signed - see procedure note - s/p PRP OD (08.09.19) - s/p PRP OS (08.23.19) - good laser in place - f/u in 4-6 weeks  4. Epiretinal membrane, left eye  The natural history, anatomy, potential for loss of vision, and treatment options including vitrectomy techniques and the complications of endophthalmitis, retinal detachment, vitreous hemorrhage, cataract progression and permanent vision loss discussed with the patient. - mild ERM - not visually significant - no surgical intervention indicated at this time - monitor  5,6. Hypertensive retinopathy OU - discussed importance of tight BP control - monitor  7. Pseudophakia OU  - s/p CE/IOL OU Herbert Deaner)  - history of complex cataract surgery OS with retained lens fragment  - ?history of PPV/PPL OS w/ Kurup -- will try to obtain records from [facility]  - doing well -- PCIOLs in good position and stable  - monitor    Ophthalmic Meds Ordered this visit:  No orders of the defined types were placed in this encounter.      Return for F/U 4-6 weeks PDR, DFE, OCT.  There are no Patient Instructions on file for this visit.   Explained the diagnoses, plan, and follow up with the patient and they expressed understanding.  Patient expressed understanding of the importance of proper follow up care.   This document serves as  a record of services personally performed by Gardiner Sleeper, MD, PhD. It was created on their behalf by Ernest Mallick, OA, an ophthalmic assistant. The creation of this record is the provider's dictation and/or activities during the visit.    Electronically signed by: Ernest Mallick, OA  11.13.19 12:47 AM    Gardiner Sleeper, M.D., Ph.D. Diseases & Surgery of the Retina and Vitreous [facility]   I have reviewed the above documentation for accuracy and completeness, and I agree with the above. Gardiner Sleeper, M.D., Ph.D. 08/16/18 12:47 AM     Abbreviations: M myopia (nearsighted); A astigmatism; H hyperopia (farsighted); P presbyopia; Mrx spectacle prescription;  CTL contact lenses; OD right eye; OS left eye; OU both eyes  XT exotropia; ET esotropia; PEK punctate epithelial keratitis; PEE punctate epithelial erosions; DES dry eye syndrome; MGD meibomian gland dysfunction; ATs artificial tears; PFAT's preservative free artificial tears; [place] nuclear sclerotic cataract; PSC posterior subcapsular cataract; ERM epi-retinal membrane; PVD posterior vitreous detachment; RD retinal detachment; DM diabetes mellitus; DR diabetic retinopathy; NPDR non-proliferative  diabetic retinopathy; PDR proliferative diabetic retinopathy; CSME clinically significant macular edema; DME diabetic macular edema; dbh dot blot hemorrhages; CWS cotton wool spot; POAG primary open angle glaucoma; C/D cup-to-disc ratio; HVF humphrey visual field; GVF goldmann visual field; OCT optical coherence tomography; IOP intraocular pressure; BRVO Branch retinal vein occlusion; CRVO central retinal vein occlusion; CRAO central retinal artery occlusion; BRAO branch retinal artery occlusion; RT retinal tear; SB scleral buckle; PPV pars plana vitrectomy; VH Vitreous hemorrhage; PRP panretinal laser photocoagulation; IVK intravitreal kenalog; VMT vitreomacular traction; MH Macular hole;  NVD neovascularization of the disc; NVE  neovascularization elsewhere; AREDS age related eye disease study; ARMD age related macular degeneration; POAG primary open angle glaucoma; EBMD epithelial/anterior basement membrane dystrophy; ACIOL anterior chamber intraocular lens; IOL intraocular lens; PCIOL posterior chamber intraocular lens; Phaco/IOL phacoemulsification with intraocular lens placement; [place] photorefractive keratectomy; LASIK laser assisted in situ keratomileusis; HTN hypertension; DM diabetes mellitus; COPD chronic obstructive pulmonary disease

## 2018-08-11 NOTE — Progress Notes (Signed)
Patient name: [name] MRN: [id number] DOB: 15-Jun-1946 Sex: female  REASON FOR VISIT:   Follow-up visit.  HPI:   HEAVYN Little is a pleasant 72 y.o. female who underwent a right above-the-knee amputation on 06/08/2018 by Dr. [name].  The patient had previously been seen in consultation by Dr. Servando Snare with bilateral leg wounds.  Of note there were no revascularization options on the left based on the previous arteriogram.  Patient dialyzes on Tuesdays Thursdays and Saturdays.  I last saw the patient on 07/14/2018.  The right above-the-knee amputation site was healing nicely and the staples were removed in the office.  On the left side there is dry gangrene of the left forefoot with tibial artery occlusive disease.  Given that the patient had diabetes, end-stage renal disease, and tibial artery occlusive disease I felt that she would be a very high risk for not healing a transmetatarsal amputation.  I felt that the safest option at this progressed would be a left above-the-knee amputation.  She comes in today to discuss her options further.   Since I saw her last, she denies any fever or chills.  She does have some pain associated with the dry gangrene of her left forefoot.  She said no significant drainage from this area.  She has no complaints referrable to the right above-the-knee amputation.  She is requesting a new stump shrinker.  Past Medical History:  Diagnosis Date  . Anemia   . Arthritis   . Asthma   . Atrial fibrillation ([place])   . Atrial flutter ([place])    a. s/p TEE/DCCV December 2013 b. 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 Bone spur of toe    Right 5th toe  . Chronic diastolic CHF (congestive heart failure) (HCC)    a. EF 50-55% by echo in 2015  . Colon polyposis   . COPD  (chronic obstructive pulmonary disease) ([place])   . DVT of upper extremity (deep vein thrombosis) ([place])    Right basilic vein, June 3244  . ESRD (end stage renal disease) on dialysis ([place])   . Essential hypertension, benign   . Fractures   . Glaucoma   . Lupus ([place])   . Pericarditis   . SLE (systemic lupus erythematosus) ([place])   . Type 2 diabetes mellitus ([place])   . UTI (lower urinary tract infection)   . Wound of right leg 06/2017    Family History  Problem Relation Age of Onset  . Diabetes Brother   . Diabetes Father   . Hypertension Father   . Hypertension Sister   . Stroke Mother   . Diabetes Sister   . Hypertension Brother   . Colon cancer Neg Hx     SOCIAL HISTORY: Social History   Tobacco Use  . Smoking status: Former Smoker    Packs/day: 1.00    Years: 30.00    Pack years: 30.00    Types: Cigarettes    Last attempt to quit: 08/29/2012    Years since quitting: 5.9  . Smokeless tobacco: Never Used  . Tobacco comment: quit 08-2012  Substance Use Topics  . Alcohol use: No    Alcohol/week: 0.0 standard drinks    Allergies  Allergen Reactions  . Ace Inhibitors Cough    Current Outpatient Medications  Medication Sig Dispense Refill  .  acetaminophen (TYLENOL) 325 MG tablet Take 1-2 tablets (325-650 mg total) by mouth every 4 (four) hours as needed for mild pain. (Patient taking differently: Take 1,000 mg by mouth daily as needed for mild pain or moderate pain. )    . albuterol (PROVENTIL HFA;VENTOLIN HFA) 108 (90 BASE) MCG/ACT inhaler Inhale 2 puffs into the lungs every 6 (six) hours as needed for shortness of breath.     . allopurinol (ZYLOPRIM) 100 MG tablet Take 100 mg by mouth every Monday, Wednesday, and Friday.     . Amino Acids-Protein Hydrolys (FEEDING SUPPLEMENT, PRO-STAT SUGAR FREE 64,) LIQD Take 30 mLs by mouth 2 (two) times daily. 900 mL 0  . amiodarone (PACERONE) 200 MG tablet Take 1 tablet (200 mg total) by mouth daily. 90 tablet 3  . apixaban  (ELIQUIS) 5 MG TABS tablet Take 1 tablet (5 mg total) by mouth 2 (two) times daily. 60 tablet 6  . atorvastatin (LIPITOR) 20 MG tablet Take 20 mg by mouth at bedtime.     . B Complex-C-Folic Acid (DIALYVITE 245) 0.8 MG TABS Take 1 tablet by mouth daily.    . brimonidine-timolol (COMBIGAN) 0.2-0.5 % ophthalmic solution Place 1 drop into the left eye 2 (two) times daily.     . calcium acetate (PHOSLO) 667 MG capsule Take 667 mg by mouth 3 (three) times daily with meals.   4  . fentaNYL (DURAGESIC - DOSED MCG/HR) 50 MCG/HR Place 25 mcg onto the skin every 3 (three) days.     . folic acid (FOLVITE) 1 MG tablet Take 1 mg by mouth daily.    . hydroxychloroquine (PLAQUENIL) 200 MG tablet Take 200 mg by mouth daily.     . Insulin Detemir (LEVEMIR FLEXTOUCH) 100 UNIT/ML Pen Inject 4 Units into the skin every morning. 15 mL 11  . insulin lispro (HUMALOG KWIKPEN) 100 UNIT/ML KiwkPen Inject 5 Units into the skin 3 (three) times daily with meals as needed (*Only take if blood sugar levels are over 100).     . metoprolol tartrate (LOPRESSOR) 25 MG tablet Take 1/2 tablet twice a day on Mondays, Wednesdays and Fridays (Patient taking differently: Take 25 mg by mouth See admin instructions. Take 12.5 mg twice a day on Mondays, Wednesdays and Fridays) 30 tablet 0  . Oxycodone HCl 10 MG TABS Take by mouth.    . pantoprazole (PROTONIX) 40 MG tablet Take 1 tablet (40 mg total) by mouth daily. 30 tablet 11  . predniSONE (DELTASONE) 5 MG tablet Take 5 mg by mouth daily with breakfast.    . travoprost, benzalkonium, (TRAVATAN) 0.004 % ophthalmic solution Place 1 drop into both eyes at bedtime.      No current facility-administered medications for this visit.     REVIEW OF SYSTEMS:  [X]  denotes positive finding, [ ]  denotes negative finding Cardiac  Comments:  Chest pain or chest pressure:    Shortness of breath upon exertion:    Short of breath when lying flat:    Irregular heart rhythm:        Vascular    Pain  in calf, thigh, or hip brought on by ambulation:    Pain in feet at night that wakes you up from your sleep:     Blood clot in your veins:    Leg swelling:         Pulmonary    Oxygen at home:    Productive cough:     Wheezing:  Neurologic    Sudden weakness in arms or legs:     Sudden numbness in arms or legs:     Sudden onset of difficulty speaking or slurred speech:    Temporary loss of vision in one eye:     Problems with dizziness:         Gastrointestinal    Blood in stool:     Vomited blood:         Genitourinary    Burning when urinating:     Blood in urine:        Psychiatric    Major depression:         Hematologic    Bleeding problems:    Problems with blood clotting too easily:        Skin    Rashes or ulcers: x       Constitutional    Fever or chills:     PHYSICAL EXAM:   Vitals:   08/11/18 1616  BP: 125/65  Pulse: 69  Resp: 16  Temp: 97.7 F (36.5 C)  TempSrc: Oral  SpO2: 100%  Weight: 137 lb (62.1 kg)  Height: 5\' 5"  (1.651 m)    GENERAL: The patient is a well-nourished female, in no acute distress. The vital signs are documented above. CARDIAC: There is a regular rate and rhythm.  VASCULAR: The patient does have a reasonable left posterior tibial signal at the ankle with a Doppler with some diastolic flow. PULMONARY: There is good air exchange bilaterally without wheezing or rales. MUSCULOSKELETAL: She has a right above-the-knee amputation which is healed. NEUROLOGIC: No focal weakness or paresthesias are detected. SKIN: She has dry gangrene of the entire left forefoot with no drainage or erythema. PSYCHIATRIC: The patient has a normal affect.  DATA:    ARTERIOGRAM: I did review the patient's previous arteriogram.  On the left side there was no significant occlusive disease down the proximal tibial vessels.  The posterior tibial was patent down to the ankle where was occluded.  The peroneal and anterior tibial arteries were  occluded.  There were no options for revascularization.  MEDICAL ISSUES:   DRY GANGRENE LEFT FOREFOOT: This patient has extensive dry gangrene of the left forefoot.  Based on her previous arteriogram she has in-line flow through the posterior tibial artery to the ankle but this is occluded there.  I explained that I see only 2 options.  We could consider a transmetatarsal amputation although I think this would be associated with significant risk of not healing given her tibial artery occlusive disease, diabetes, end-stage renal disease.  The alternative would be primary above-the-knee amputation on the left.  I do not think there would be any advantage to doing a below the knee amputation given the increased risk of nonhealing and the fact that she is probably not a good candidate for bilateral prostheses.  After extensive discussion with the patient and her family member they would like to attempt transmetatarsal amputation which I think is reasonable.  We would keep her in the hospital after.  She dialyzes on Tuesdays Thursdays and Saturdays and we will have to try to arrange this on Monday.  We would have to stop her Eliquis 48 hours preoperatively.  [name] [facility] [phone]

## 2018-08-11 NOTE — H&P (View-Only) (Signed)
Patient name: [name] MRN: [id number] DOB: 1946/04/08 Sex: female  REASON FOR VISIT:   Follow-up visit.  HPI:   [name] is a pleasant 72 y.o. female who underwent a right above-the-knee amputation on 06/08/2018 by Dr. [name].  The patient had previously been seen in consultation by Dr. Servando Snare with bilateral leg wounds.  Of note there were no revascularization options on the left based on the previous arteriogram.  Patient dialyzes on Tuesdays Thursdays and Saturdays.  I last saw the patient on 07/14/2018.  The right above-the-knee amputation site was healing nicely and the staples were removed in the office.  On the left side there is dry gangrene of the left forefoot with tibial artery occlusive disease.  Given that the patient had diabetes, end-stage renal disease, and tibial artery occlusive disease I felt that she would be a very high risk for not healing a transmetatarsal amputation.  I felt that the safest option at this progressed would be a left above-the-knee amputation.  She comes in today to discuss her options further.   Since I saw her last, she denies any fever or chills.  She does have some pain associated with the dry gangrene of her left forefoot.  She said no significant drainage from this area.  She has no complaints referrable to the right above-the-knee amputation.  She is requesting a new stump shrinker.  Past Medical History:  Diagnosis Date  . Anemia   . Arthritis   . Asthma   . Atrial fibrillation ([place])   . Atrial flutter ([place])    a. s/p TEE/DCCV December 2013 b. 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 Bone spur of toe    Right 5th toe  . Chronic diastolic CHF (congestive heart failure) (HCC)    a. EF 50-55% by echo in 2015  . Colon polyposis   . COPD  (chronic obstructive pulmonary disease) ([place])   . DVT of upper extremity (deep vein thrombosis) ([place])    Right basilic vein, June 6010  . ESRD (end stage renal disease) on dialysis ([place])   . Essential hypertension, benign   . Fractures   . Glaucoma   . Lupus ([place])   . Pericarditis   . SLE (systemic lupus erythematosus) ([place])   . Type 2 diabetes mellitus ([place])   . UTI (lower urinary tract infection)   . Wound of right leg 06/2017    Family History  Problem Relation Age of Onset  . Diabetes Brother   . Diabetes Father   . Hypertension Father   . Hypertension Sister   . Stroke Mother   . Diabetes Sister   . Hypertension Brother   . Colon cancer Neg Hx     SOCIAL HISTORY: Social History   Tobacco Use  . Smoking status: Former Smoker    Packs/day: 1.00    Years: 30.00    Pack years: 30.00    Types: Cigarettes    Last attempt to quit: 08/29/2012    Years since quitting: 5.9  . Smokeless tobacco: Never Used  . Tobacco comment: quit 08-2012  Substance Use Topics  . Alcohol use: No    Alcohol/week: 0.0 standard drinks    Allergies  Allergen Reactions  . Ace Inhibitors Cough    Current Outpatient Medications  Medication Sig Dispense Refill  .  acetaminophen (TYLENOL) 325 MG tablet Take 1-2 tablets (325-650 mg total) by mouth every 4 (four) hours as needed for mild pain. (Patient taking differently: Take 1,000 mg by mouth daily as needed for mild pain or moderate pain. )    . albuterol (PROVENTIL HFA;VENTOLIN HFA) 108 (90 BASE) MCG/ACT inhaler Inhale 2 puffs into the lungs every 6 (six) hours as needed for shortness of breath.     . allopurinol (ZYLOPRIM) 100 MG tablet Take 100 mg by mouth every Monday, Wednesday, and Friday.     . Amino Acids-Protein Hydrolys (FEEDING SUPPLEMENT, PRO-STAT SUGAR FREE 64,) LIQD Take 30 mLs by mouth 2 (two) times daily. 900 mL 0  . amiodarone (PACERONE) 200 MG tablet Take 1 tablet (200 mg total) by mouth daily. 90 tablet 3  . apixaban  (ELIQUIS) 5 MG TABS tablet Take 1 tablet (5 mg total) by mouth 2 (two) times daily. 60 tablet 6  . atorvastatin (LIPITOR) 20 MG tablet Take 20 mg by mouth at bedtime.     . B Complex-C-Folic Acid (DIALYVITE 338) 0.8 MG TABS Take 1 tablet by mouth daily.    . brimonidine-timolol (COMBIGAN) 0.2-0.5 % ophthalmic solution Place 1 drop into the left eye 2 (two) times daily.     . calcium acetate (PHOSLO) 667 MG capsule Take 667 mg by mouth 3 (three) times daily with meals.   4  . fentaNYL (DURAGESIC - DOSED MCG/HR) 50 MCG/HR Place 25 mcg onto the skin every 3 (three) days.     . folic acid (FOLVITE) 1 MG tablet Take 1 mg by mouth daily.    . hydroxychloroquine (PLAQUENIL) 200 MG tablet Take 200 mg by mouth daily.     . Insulin Detemir (LEVEMIR FLEXTOUCH) 100 UNIT/ML Pen Inject 4 Units into the skin every morning. 15 mL 11  . insulin lispro (HUMALOG KWIKPEN) 100 UNIT/ML KiwkPen Inject 5 Units into the skin 3 (three) times daily with meals as needed (*Only take if blood sugar levels are over 100).     . metoprolol tartrate (LOPRESSOR) 25 MG tablet Take 1/2 tablet twice a day on Mondays, Wednesdays and Fridays (Patient taking differently: Take 25 mg by mouth See admin instructions. Take 12.5 mg twice a day on Mondays, Wednesdays and Fridays) 30 tablet 0  . Oxycodone HCl 10 MG TABS Take by mouth.    . pantoprazole (PROTONIX) 40 MG tablet Take 1 tablet (40 mg total) by mouth daily. 30 tablet 11  . predniSONE (DELTASONE) 5 MG tablet Take 5 mg by mouth daily with breakfast.    . travoprost, benzalkonium, (TRAVATAN) 0.004 % ophthalmic solution Place 1 drop into both eyes at bedtime.      No current facility-administered medications for this visit.     REVIEW OF SYSTEMS:  [X]  denotes positive finding, [ ]  denotes negative finding Cardiac  Comments:  Chest pain or chest pressure:    Shortness of breath upon exertion:    Short of breath when lying flat:    Irregular heart rhythm:        Vascular    Pain  in calf, thigh, or hip brought on by ambulation:    Pain in feet at night that wakes you up from your sleep:     Blood clot in your veins:    Leg swelling:         Pulmonary    Oxygen at home:    Productive cough:     Wheezing:  Neurologic    Sudden weakness in arms or legs:     Sudden numbness in arms or legs:     Sudden onset of difficulty speaking or slurred speech:    Temporary loss of vision in one eye:     Problems with dizziness:         Gastrointestinal    Blood in stool:     Vomited blood:         Genitourinary    Burning when urinating:     Blood in urine:        Psychiatric    Major depression:         Hematologic    Bleeding problems:    Problems with blood clotting too easily:        Skin    Rashes or ulcers: x       Constitutional    Fever or chills:     PHYSICAL EXAM:   Vitals:   08/11/18 1616  BP: 125/65  Pulse: 69  Resp: 16  Temp: 97.7 F (36.5 C)  TempSrc: Oral  SpO2: 100%  Weight: 137 lb (62.1 kg)  Height: 5\' 5"  (1.651 m)    GENERAL: The patient is a well-nourished female, in no acute distress. The vital signs are documented above. CARDIAC: There is a regular rate and rhythm.  VASCULAR: The patient does have a reasonable left posterior tibial signal at the ankle with a Doppler with some diastolic flow. PULMONARY: There is good air exchange bilaterally without wheezing or rales. MUSCULOSKELETAL: She has a right above-the-knee amputation which is healed. NEUROLOGIC: No focal weakness or paresthesias are detected. SKIN: She has dry gangrene of the entire left forefoot with no drainage or erythema. PSYCHIATRIC: The patient has a normal affect.  DATA:    ARTERIOGRAM: I did review the patient's previous arteriogram.  On the left side there was no significant occlusive disease down the proximal tibial vessels.  The posterior tibial was patent down to the ankle where was occluded.  The peroneal and anterior tibial arteries were  occluded.  There were no options for revascularization.  MEDICAL ISSUES:   DRY GANGRENE LEFT FOREFOOT: This patient has extensive dry gangrene of the left forefoot.  Based on her previous arteriogram she has in-line flow through the posterior tibial artery to the ankle but this is occluded there.  I explained that I see only 2 options.  We could consider a transmetatarsal amputation although I think this would be associated with significant risk of not healing given her tibial artery occlusive disease, diabetes, end-stage renal disease.  The alternative would be primary above-the-knee amputation on the left.  I do not think there would be any advantage to doing a below the knee amputation given the increased risk of nonhealing and the fact that she is probably not a good candidate for bilateral prostheses.  After extensive discussion with the patient and her family member they would like to attempt transmetatarsal amputation which I think is reasonable.  We would keep her in the hospital after.  She dialyzes on Tuesdays Thursdays and Saturdays and we will have to try to arrange this on Monday.  We would have to stop her Eliquis 48 hours preoperatively.  [name] [facility] [phone]

## 2018-08-12 DIAGNOSIS — E1129 Type 2 diabetes mellitus with other diabetic kidney complication: Secondary | ICD-10-CM | POA: Diagnosis not present

## 2018-08-12 DIAGNOSIS — E876 Hypokalemia: Secondary | ICD-10-CM | POA: Diagnosis not present

## 2018-08-12 DIAGNOSIS — D631 Anemia in chronic kidney disease: Secondary | ICD-10-CM | POA: Diagnosis not present

## 2018-08-12 DIAGNOSIS — R52 Pain, unspecified: Secondary | ICD-10-CM | POA: Diagnosis not present

## 2018-08-12 DIAGNOSIS — D689 Coagulation defect, unspecified: Secondary | ICD-10-CM | POA: Diagnosis not present

## 2018-08-12 DIAGNOSIS — R197 Diarrhea, unspecified: Secondary | ICD-10-CM | POA: Diagnosis not present

## 2018-08-12 DIAGNOSIS — N2581 Secondary hyperparathyroidism of renal origin: Secondary | ICD-10-CM | POA: Diagnosis not present

## 2018-08-12 DIAGNOSIS — N186 End stage renal disease: Secondary | ICD-10-CM | POA: Diagnosis not present

## 2018-08-13 ENCOUNTER — Encounter (INDEPENDENT_AMBULATORY_CARE_PROVIDER_SITE_OTHER): Payer: Self-pay | Admitting: Ophthalmology

## 2018-08-13 ENCOUNTER — Ambulatory Visit (INDEPENDENT_AMBULATORY_CARE_PROVIDER_SITE_OTHER): Payer: Medicare Other | Admitting: Ophthalmology

## 2018-08-13 DIAGNOSIS — H35372 Puckering of macula, left eye: Secondary | ICD-10-CM | POA: Diagnosis not present

## 2018-08-13 DIAGNOSIS — E113492 Type 2 diabetes mellitus with severe nonproliferative diabetic retinopathy without macular edema, left eye: Secondary | ICD-10-CM | POA: Diagnosis not present

## 2018-08-13 DIAGNOSIS — Z961 Presence of intraocular lens: Secondary | ICD-10-CM

## 2018-08-13 DIAGNOSIS — D49511 Neoplasm of unspecified behavior of right kidney: Secondary | ICD-10-CM | POA: Diagnosis not present

## 2018-08-13 DIAGNOSIS — I10 Essential (primary) hypertension: Secondary | ICD-10-CM

## 2018-08-13 DIAGNOSIS — E113591 Type 2 diabetes mellitus with proliferative diabetic retinopathy without macular edema, right eye: Secondary | ICD-10-CM | POA: Diagnosis not present

## 2018-08-13 DIAGNOSIS — N19 Unspecified kidney failure: Secondary | ICD-10-CM | POA: Diagnosis not present

## 2018-08-13 DIAGNOSIS — H35033 Hypertensive retinopathy, bilateral: Secondary | ICD-10-CM

## 2018-08-13 DIAGNOSIS — R1031 Right lower quadrant pain: Secondary | ICD-10-CM | POA: Diagnosis not present

## 2018-08-13 DIAGNOSIS — H3581 Retinal edema: Secondary | ICD-10-CM

## 2018-08-13 DIAGNOSIS — Z89611 Acquired absence of right leg above knee: Secondary | ICD-10-CM | POA: Diagnosis not present

## 2018-08-14 DIAGNOSIS — D631 Anemia in chronic kidney disease: Secondary | ICD-10-CM | POA: Diagnosis not present

## 2018-08-14 DIAGNOSIS — E876 Hypokalemia: Secondary | ICD-10-CM | POA: Diagnosis not present

## 2018-08-14 DIAGNOSIS — R197 Diarrhea, unspecified: Secondary | ICD-10-CM | POA: Diagnosis not present

## 2018-08-14 DIAGNOSIS — E1129 Type 2 diabetes mellitus with other diabetic kidney complication: Secondary | ICD-10-CM | POA: Diagnosis not present

## 2018-08-14 DIAGNOSIS — R52 Pain, unspecified: Secondary | ICD-10-CM | POA: Diagnosis not present

## 2018-08-14 DIAGNOSIS — D689 Coagulation defect, unspecified: Secondary | ICD-10-CM | POA: Diagnosis not present

## 2018-08-14 DIAGNOSIS — N186 End stage renal disease: Secondary | ICD-10-CM | POA: Diagnosis not present

## 2018-08-14 DIAGNOSIS — N2581 Secondary hyperparathyroidism of renal origin: Secondary | ICD-10-CM | POA: Diagnosis not present

## 2018-08-16 ENCOUNTER — Encounter (INDEPENDENT_AMBULATORY_CARE_PROVIDER_SITE_OTHER): Payer: Self-pay | Admitting: Ophthalmology

## 2018-08-16 ENCOUNTER — Ambulatory Visit (INDEPENDENT_AMBULATORY_CARE_PROVIDER_SITE_OTHER): Payer: Medicare Other | Admitting: "Endocrinology

## 2018-08-16 ENCOUNTER — Other Ambulatory Visit: Payer: Self-pay | Admitting: Urology

## 2018-08-16 ENCOUNTER — Encounter: Payer: Self-pay | Admitting: "Endocrinology

## 2018-08-16 ENCOUNTER — Other Ambulatory Visit: Payer: Self-pay | Admitting: *Deleted

## 2018-08-16 VITALS — BP 132/74 | HR 72

## 2018-08-16 DIAGNOSIS — N184 Chronic kidney disease, stage 4 (severe): Secondary | ICD-10-CM

## 2018-08-16 DIAGNOSIS — J449 Chronic obstructive pulmonary disease, unspecified: Secondary | ICD-10-CM | POA: Diagnosis not present

## 2018-08-16 DIAGNOSIS — M329 Systemic lupus erythematosus, unspecified: Secondary | ICD-10-CM | POA: Diagnosis not present

## 2018-08-16 DIAGNOSIS — E782 Mixed hyperlipidemia: Secondary | ICD-10-CM | POA: Diagnosis not present

## 2018-08-16 DIAGNOSIS — I4891 Unspecified atrial fibrillation: Secondary | ICD-10-CM | POA: Diagnosis not present

## 2018-08-16 DIAGNOSIS — I10 Essential (primary) hypertension: Secondary | ICD-10-CM | POA: Diagnosis not present

## 2018-08-16 DIAGNOSIS — E1151 Type 2 diabetes mellitus with diabetic peripheral angiopathy without gangrene: Secondary | ICD-10-CM | POA: Diagnosis not present

## 2018-08-16 DIAGNOSIS — Z86718 Personal history of other venous thrombosis and embolism: Secondary | ICD-10-CM | POA: Diagnosis not present

## 2018-08-16 DIAGNOSIS — N2889 Other specified disorders of kidney and ureter: Secondary | ICD-10-CM

## 2018-08-16 DIAGNOSIS — I872 Venous insufficiency (chronic) (peripheral): Secondary | ICD-10-CM | POA: Diagnosis not present

## 2018-08-16 DIAGNOSIS — Z992 Dependence on renal dialysis: Secondary | ICD-10-CM | POA: Diagnosis not present

## 2018-08-16 DIAGNOSIS — E1122 Type 2 diabetes mellitus with diabetic chronic kidney disease: Secondary | ICD-10-CM

## 2018-08-16 DIAGNOSIS — M150 Primary generalized (osteo)arthritis: Secondary | ICD-10-CM | POA: Diagnosis not present

## 2018-08-16 DIAGNOSIS — M1990 Unspecified osteoarthritis, unspecified site: Secondary | ICD-10-CM | POA: Diagnosis not present

## 2018-08-16 DIAGNOSIS — D631 Anemia in chronic kidney disease: Secondary | ICD-10-CM | POA: Diagnosis not present

## 2018-08-16 DIAGNOSIS — Z794 Long term (current) use of insulin: Secondary | ICD-10-CM | POA: Diagnosis not present

## 2018-08-16 DIAGNOSIS — H409 Unspecified glaucoma: Secondary | ICD-10-CM | POA: Diagnosis not present

## 2018-08-16 DIAGNOSIS — I129 Hypertensive chronic kidney disease with stage 1 through stage 4 chronic kidney disease, or unspecified chronic kidney disease: Secondary | ICD-10-CM | POA: Diagnosis not present

## 2018-08-16 DIAGNOSIS — N186 End stage renal disease: Secondary | ICD-10-CM | POA: Diagnosis not present

## 2018-08-16 DIAGNOSIS — Z87891 Personal history of nicotine dependence: Secondary | ICD-10-CM | POA: Diagnosis not present

## 2018-08-16 DIAGNOSIS — Z89611 Acquired absence of right leg above knee: Secondary | ICD-10-CM | POA: Diagnosis not present

## 2018-08-16 DIAGNOSIS — M109 Gout, unspecified: Secondary | ICD-10-CM | POA: Diagnosis not present

## 2018-08-16 DIAGNOSIS — E1142 Type 2 diabetes mellitus with diabetic polyneuropathy: Secondary | ICD-10-CM | POA: Diagnosis not present

## 2018-08-16 DIAGNOSIS — E11319 Type 2 diabetes mellitus with unspecified diabetic retinopathy without macular edema: Secondary | ICD-10-CM | POA: Diagnosis not present

## 2018-08-16 DIAGNOSIS — Z4781 Encounter for orthopedic aftercare following surgical amputation: Secondary | ICD-10-CM | POA: Diagnosis not present

## 2018-08-16 DIAGNOSIS — E785 Hyperlipidemia, unspecified: Secondary | ICD-10-CM | POA: Diagnosis not present

## 2018-08-16 NOTE — Patient Instructions (Signed)

## 2018-08-16 NOTE — Progress Notes (Signed)
Endocrinology follow-up note  Subjective:    Patient ID: [name], female    DOB: 1946/08/27,    Past Medical History:  Diagnosis Date  . Anemia   . Arthritis   . Asthma   . Atrial fibrillation ([place])   . Atrial flutter ([place])    a. s/p TEE/DCCV December 2013 b. 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 Bone spur of toe    Right 5th toe  . Chronic diastolic CHF (congestive heart failure) (HCC)    a. EF 50-55% by echo in 2015  . Colon polyposis   . COPD (chronic obstructive pulmonary disease) ([place])   . DVT of upper extremity (deep vein thrombosis) ([place])    Right basilic vein, June 0998  . ESRD (end stage renal disease) on dialysis ([place])   . Essential hypertension, benign   . Fractures   . Glaucoma   . Lupus ([place])   . Pericarditis   . SLE (systemic lupus erythematosus) ([place])   . Type 2 diabetes mellitus ([place])   . UTI (lower urinary tract infection)   . Wound of right leg 06/2017   Past Surgical History:  Procedure Laterality Date  . ABDOMINAL AORTOGRAM W/LOWER EXTREMITY N/A 12/18/2017   Procedure: ABDOMINAL AORTOGRAM W/LOWER EXTREMITY;  Surgeon: Elam Dutch, MD;  Location: [facility];  Service: Cardiovascular;  Laterality: N/A;  bilateral  . ABDOMINAL HYSTERECTOMY  1983  . AMPUTATION Right 06/08/2018   Procedure: RIGHT ABOVE KNEE AMPUTATION;  Surgeon: Elam Dutch, MD;  Location: [facility];  Service: Vascular;  Laterality: Right;  . ANKLE SURGERY  1993  . AV FISTULA PLACEMENT Left 03/27/2014   Procedure: ARTERIOVENOUS (AV) FISTULA CREATION;  Surgeon: Rosetta Posner, MD;  Location: [facility];  Service: Vascular;  Laterality: Left;  . BACK SURGERY  1980  . BASCILIC VEIN TRANSPOSITION Left 01/13/2018   Procedure: LEFT BASILIC VEIN TRANSPOSITION SECOND STAGE;  Surgeon: Elam Dutch, MD;   Location: [facility];  Service: Vascular;  Laterality: Left;  . CARDIOVASCULAR STRESS TEST  12/19/2009   no stress induced rhythm abnormalities, ekg negative for ischemia  . CARDIOVERSION  09/16/2012   Procedure: CARDIOVERSION;  Surgeon: Sanda Klein, MD;  [facility];  Service: Cardiovascular;  Laterality: N/A;  . CATARACT EXTRACTION    . COLONOSCOPY  09/20/2012   Dr. [name]: multiple tubular adenomas   . COLONOSCOPY  2005   Dr. [name]. Polyps, path unknown   . COLONOSCOPY N/A 07/24/2016   Dr. [name]: 3 significant size polyps removed from the colon, tubular adenomas.  Next colonoscopy in October 2020.  Marland Kitchen ESOPHAGOGASTRODUODENOSCOPY  09/20/2012   Dr. [name]: duodenal erosion and possible resolving ulcer at the angularis   . ESOPHAGOGASTRODUODENOSCOPY N/A 03/24/2018   Dr. [name]: Mild erosive reflux esophagitis, erosive gastropathy and enteropathy fairly extensive, no H. pylori on biopsy.  Marland Kitchen EYE SURGERY    . IR FLUORO GUIDE CV LINE RIGHT  11/09/2017  . IR US GUIDE VASC ACCESS RIGHT  11/09/2017  . REVISON OF ARTERIOVENOUS FISTULA Left 03/15/2018   Procedure: REVISION OF Left arm BASILIC VEIN TRANSPOSITION;  Surgeon: Angelia Mould, MD;  Location: [facility];  Service: Vascular;  Laterality: Left;  . TEE WITHOUT CARDIOVERSION  09/16/2012   Procedure: TRANSESOPHAGEAL ECHOCARDIOGRAM (TEE);  Surgeon: Sanda Klein, MD;  Location: [facility];  Service: Cardiovascular;  Laterality: N/A;  . TRANSESOPHAGEAL ECHOCARDIOGRAM WITH CARDIOVERSION  09/16/2012   EF 60-65%, moderate LVH, moderate regurg of the aortic valve, LA moderately dilated   Social History   Socioeconomic History  . Marital status: Married    Spouse name: Not on file  . Number of children: Not on file  . Years of education: Not on file  . Highest education level: Not on file  Occupational History  . Not on file  Social Needs  . Financial resource strain: Not on file  . Food insecurity:    Worry: Not on file     Inability: Not on file  . Transportation needs:    Medical: Not on file    Non-medical: Not on file  Tobacco Use  . Smoking status: Former Smoker    Packs/day: 1.00    Years: 30.00    Pack years: 30.00    Types: Cigarettes    Last attempt to quit: 08/29/2012    Years since quitting: 5.9  . Smokeless tobacco: Never Used  . Tobacco comment: quit 08-2012  Substance and Sexual Activity  . Alcohol use: No    Alcohol/week: 0.0 standard drinks  . Drug use: No  . Sexual activity: Yes    Partners: Male    Birth control/protection: Surgical    Comment: hyst  Lifestyle  . Physical activity:    Days per week: Not on file    Minutes per session: Not on file  . Stress: Not on file  Relationships  . Social connections:    Talks on phone: Not on file    Gets together: Not on file    Attends religious service: Not on file    Active member of club or organization: Not on file    Attends meetings of clubs or organizations: Not on file    Relationship status: Not on file  Other Topics Concern  . Not on file  Social History Narrative  . Not on file   Outpatient Encounter Medications as of 08/16/2018  Medication Sig  . acetaminophen (TYLENOL) 325 MG tablet Take 1-2 tablets (325-650 mg total) by mouth every 4 (four) hours as needed for mild pain. (Patient taking differently: Take 1,000 mg by mouth daily as needed for mild pain or moderate pain. )  . albuterol (PROVENTIL HFA;VENTOLIN HFA) 108 (90 BASE) MCG/ACT inhaler Inhale 2 puffs into the lungs every 6 (six) hours as needed for shortness of breath.   . allopurinol (ZYLOPRIM) 100 MG tablet Take 100 mg by mouth every Monday, Wednesday, and Friday.   . Amino Acids-Protein Hydrolys (FEEDING SUPPLEMENT, PRO-STAT SUGAR FREE 64,) LIQD Take 30 mLs by mouth 2 (two) times daily.  Marland Kitchen amiodarone (PACERONE) 200 MG tablet Take 1 tablet (200 mg total) by mouth daily.  Marland Kitchen apixaban (ELIQUIS) 5 MG TABS tablet Take 1 tablet (5 mg total) by mouth 2 (two) times  daily.  Marland Kitchen atorvastatin (LIPITOR) 20 MG tablet Take 20 mg by mouth at bedtime.   . B Complex-C-Folic Acid (DIALYVITE 937) 0.8 MG TABS Take 1 tablet by mouth daily.  . brimonidine-timolol (COMBIGAN) 0.2-0.5 % ophthalmic solution Place 1 drop into the left eye 2 (two) times daily.   . calcium acetate (PHOSLO) 667 MG capsule Take 667 mg by mouth 3 (three) times daily with meals.   . fentaNYL (DURAGESIC - DOSED MCG/HR) 50 MCG/HR Place 25 mcg onto the skin every 3 (three) days.   . folic acid (FOLVITE) 1 MG tablet Take 1 mg by mouth daily.  Marland Kitchen  hydroxychloroquine (PLAQUENIL) 200 MG tablet Take 200 mg by mouth daily.   . Insulin Detemir (LEVEMIR FLEXTOUCH) 100 UNIT/ML Pen Inject 4 Units into the skin every morning.  . insulin lispro (HUMALOG KWIKPEN) 100 UNIT/ML KiwkPen Inject 5 Units into the skin 3 (three) times daily with meals as needed (*Only take if blood sugar levels are over 100).   . metoprolol tartrate (LOPRESSOR) 25 MG tablet Take 1/2 tablet twice a day on Mondays, Wednesdays and Fridays (Patient taking differently: Take 25 mg by mouth See admin instructions. Take 12.5 mg twice a day on Mondays, Wednesdays and Fridays)  . Oxycodone HCl 10 MG TABS Take by mouth.  . pantoprazole (PROTONIX) 40 MG tablet Take 1 tablet (40 mg total) by mouth daily.  . predniSONE (DELTASONE) 5 MG tablet Take 5 mg by mouth daily with breakfast.  . travoprost, benzalkonium, (TRAVATAN) 0.004 % ophthalmic solution Place 1 drop into both eyes at bedtime.    No facility-administered encounter medications on file as of 08/16/2018.    ALLERGIES: Allergies  Allergen Reactions  . Ace Inhibitors Cough   VACCINATION STATUS: Immunization History  Administered Date(s) Administered  . Influenza, High Dose Seasonal PF 06/11/2018  . Influenza,inj,Quad PF,6+ Mos 07/03/2014  . Influenza-Unspecified 07/11/2015, 07/08/2016  . PPD Test 11/07/2017  . Pneumococcal Polysaccharide-23 11/04/2017    Diabetes  She presents for her  follow-up diabetic visit. She has type 2 diabetes mellitus. Onset time: diagnosed at approx age of 42. Her disease course has been improving. Pertinent negatives for hypoglycemia include no confusion, headaches, pallor or seizures. Pertinent negatives for diabetes include no chest pain, no fatigue, no polydipsia, no polyphagia and no polyuria. Symptoms are improving. Diabetic complications include nephropathy. Risk factors for coronary artery disease include dyslipidemia, diabetes mellitus and obesity. Current diabetic treatment includes insulin injections. She is compliant with treatment most of the time. Her weight is fluctuating minimally. She is following a generally unhealthy diet. She has had a previous visit with a dietitian. She never participates in exercise. Her home blood glucose trend is increasing steadily. Her breakfast blood glucose range is generally 140-180 mg/dl. Her lunch blood glucose range is generally 180-200 mg/dl. Her dinner blood glucose range is generally 180-200 mg/dl. Her bedtime blood glucose range is generally 180-200 mg/dl. Her overall blood glucose range is 180-200 mg/dl. An ACE inhibitor/angiotensin II receptor blocker is being taken.  Hypertension  This is a chronic problem. The current episode started more than 1 year ago. The problem is controlled. Pertinent negatives include no chest pain, headaches, palpitations or shortness of breath. Risk factors for coronary artery disease include obesity, dyslipidemia, diabetes mellitus and sedentary lifestyle. Hypertensive end-organ damage includes kidney disease. Identifiable causes of hypertension include chronic renal disease.  Hyperlipidemia  This is a chronic problem. The current episode started more than 1 year ago. Exacerbating diseases include chronic renal disease, diabetes and obesity. Pertinent negatives include no chest pain, myalgias or shortness of breath. Current antihyperlipidemic treatment includes statins. Risk factors  for coronary artery disease include diabetes mellitus, dyslipidemia, hypertension, a sedentary lifestyle, family history and post-menopausal.    Review of Systems  Constitutional: Negative for fatigue and unexpected weight change.  HENT: Negative for trouble swallowing and voice change.   Eyes: Negative for visual disturbance.  Respiratory: Negative for cough, shortness of breath and wheezing.   Cardiovascular: Negative for chest pain, palpitations and leg swelling.  Gastrointestinal: Negative for diarrhea, nausea and vomiting.  Endocrine: Negative for cold intolerance, heat intolerance, polydipsia, polyphagia and  polyuria.  Musculoskeletal: Negative for arthralgias and myalgias.  Skin: Negative for color change, pallor, rash and wound.  Neurological: Negative for seizures and headaches.  Psychiatric/Behavioral: Negative for confusion and suicidal ideas.    Objective:    BP 132/74   Pulse 72   Wt Readings from Last 3 Encounters:  08/11/18 137 lb (62.1 kg)  08/04/18 137 lb (62.1 kg)  07/30/18 139 lb 1.8 oz (63.1 kg)    Physical Exam  Constitutional: She is oriented to person, place, and time. She appears well-developed.  Wheelchair-bound due to disequilibrium.  HENT:  Head: Normocephalic and atraumatic.  Eyes: EOM are normal.  Neck: Normal range of motion. Neck supple. No tracheal deviation present. No thyromegaly present.  Cardiovascular: Normal rate.  Pulmonary/Chest: Effort normal.  Abdominal: There is no tenderness. There is no guarding.  Musculoskeletal: She exhibits no edema.  Wheelchair-bound due to, recent right above-knee amputation, and planned left lower extremity ambulation.    Neurological: She is alert and oriented to person, place, and time. She has normal reflexes. No cranial nerve deficit. Coordination normal.  Skin: Skin is warm and dry. No rash noted. No erythema. No pallor.  Psychiatric: She has a normal mood and affect. Judgment normal.    Chemistry  (most recent): Lab Results  Component Value Date   NA 140 08/09/2018   K 4.4 08/09/2018   CL 99 08/09/2018   CO2 21 08/09/2018   BUN 67 (H) 08/09/2018   CREATININE 4.38 (H) 08/09/2018   Diabetic Labs (most recent): Lab Results  Component Value Date   HGBA1C 5.0 08/09/2018   HGBA1C 5.4 06/09/2018   HGBA1C 6.2 (H) 05/05/2018      Assessment & Plan:   1. Type 2 diabetes mellitus with stage 5 chronic kidney disease- status post fistula placement for hemodialysis. - She is not started on hemodialysis. -She is advised to stick to a routine mealtimes to eat 3 meals  a day and avoid unnecessary snacks ( to snack only to correct hypoglycemia). - Her recent labs show A1c of 5%, likely affected by a rapid turnover of RBCs.  Her anemia is treated to hemoglobin of 11.3. -   Her postprandial blood glucose readings are significantly above target .  Patient is advised to eliminate simple carbs  from their diet including cakes desserts ice cream soda (  diet and regular) , sweet tea , Candies,  chips and cookies, artificial sweeteners,   and "sugar-free" products .  This will help patient to have stable blood glucose profile and potentially lose weight. Patient is given detailed personalized glucose monitoring and insulin dosing instructions. Patient is instructed to call back with extremes of blood glucose less than 70 or greater than 300. Patient to bring meter and  blood glucose logs during their next visit.  -She will continue to require intensive treatment with basal/bolus insulin in order for her to maintain control of diabetes to target. -She is advised to continue Lvemir to 10 units every morning with breakfast, and continue Humalog  5 units 3 times daily AC when her pre-meal blood glucose is 100 mg/dL or above.  She is advised to continue strict monitoring of blood glucose 4 times a day-before meals and at bedtime.   -She is advised to call clinic if blood glucose readings are less than 70 or  greater than 3003.  2. Essential hypertension, benign - Her blood pressure is Controlled to target.  I have advised her to continue current medications including metoprolol 50  mg p.o. twice daily.    3. Hyperlipemia No recent fasting lipid panel.  She is advised to continue atorvastatin 20 mg p.o. Nightly.  4. Morbid obesity Carbs advise given. See above #1.  5. vitamin D deficiency.  -Continue current Vitamin D supplement.  6) Chronic Care/Health Maintenance:  -Patient is on  Statin medications and encouraged to continue to follow up with Ophthalmology, nephrology for end-stage renal disease status post fistula placement, Podiatrist at least yearly or according to recommendations, and advised to   stay away from smoking. I have recommended yearly flu vaccine and pneumonia vaccination at least every 5 years; and  sleep for at least 7 hours a day.   - Time spent with the patient: 25 min, of which >50% was spent in reviewing her blood glucose logs , discussing her hypo- and hyper-glycemic episodes, reviewing her current and  previous labs and insulin doses and developing a plan to avoid hypo- and hyper-glycemia. Please refer to Patient Instructions for Blood Glucose Monitoring and Insulin/Medications Dosing Guide"  in media tab for additional information. [name] participated in the discussions, expressed understanding, and voiced agreement with the above plans.  All questions were answered to her satisfaction. she is encouraged to contact clinic should she have any questions or concerns prior to her return visit.    Follow up plan: Return in about 6 months (around 02/14/2019) for Follow up with Pre-visit Labs, Meter, and Logs.  Glade Lloyd, MD Phone: [phone]  Fax: [phone]  -  This note was partially dictated with voice recognition software. Similar sounding words can be transcribed inadequately or may not  be corrected upon review.  08/16/2018, 6:20 PM

## 2018-08-17 DIAGNOSIS — N186 End stage renal disease: Secondary | ICD-10-CM | POA: Diagnosis not present

## 2018-08-17 DIAGNOSIS — E1129 Type 2 diabetes mellitus with other diabetic kidney complication: Secondary | ICD-10-CM | POA: Diagnosis not present

## 2018-08-17 DIAGNOSIS — D631 Anemia in chronic kidney disease: Secondary | ICD-10-CM | POA: Diagnosis not present

## 2018-08-17 DIAGNOSIS — R197 Diarrhea, unspecified: Secondary | ICD-10-CM | POA: Diagnosis not present

## 2018-08-17 DIAGNOSIS — R52 Pain, unspecified: Secondary | ICD-10-CM | POA: Diagnosis not present

## 2018-08-17 DIAGNOSIS — D689 Coagulation defect, unspecified: Secondary | ICD-10-CM | POA: Diagnosis not present

## 2018-08-17 DIAGNOSIS — E876 Hypokalemia: Secondary | ICD-10-CM | POA: Diagnosis not present

## 2018-08-17 DIAGNOSIS — N2581 Secondary hyperparathyroidism of renal origin: Secondary | ICD-10-CM | POA: Diagnosis not present

## 2018-08-18 ENCOUNTER — Ambulatory Visit (INDEPENDENT_AMBULATORY_CARE_PROVIDER_SITE_OTHER): Payer: Medicare Other | Admitting: Obstetrics and Gynecology

## 2018-08-18 DIAGNOSIS — Z09 Encounter for follow-up examination after completed treatment for conditions other than malignant neoplasm: Secondary | ICD-10-CM | POA: Diagnosis not present

## 2018-08-18 DIAGNOSIS — Z8619 Personal history of other infectious and parasitic diseases: Secondary | ICD-10-CM | POA: Diagnosis not present

## 2018-08-18 LAB — POCT WET PREP (WET MOUNT): TRICHOMONAS WET PREP HPF POC: ABSENT

## 2018-08-18 NOTE — Addendum Note (Signed)
Addended by: Gaylyn Rong A on: 08/18/2018 02:04 PM   Modules accepted: Orders

## 2018-08-18 NOTE — Patient Instructions (Signed)
Trichomoniasis °Trichomoniasis is an STI (sexually transmitted infection) that can affect both women and men. In women, the outer area of the female genitalia (vulva) and the vagina are affected. In men, the penis is mainly affected, but the prostate and other reproductive organs can also be involved. This condition can be treated with medicine. It often has no symptoms (is asymptomatic), especially in men. °What are the causes? °This condition is caused by an organism called Trichomonas vaginalis. Trichomoniasis most often spreads from person to person (is contagious) through sexual contact. °What increases the risk? °The following factors may make you more likely to develop this condition: °· Having unprotected sexual intercourse. °· Having sexual intercourse with a partner who has trichomoniasis. °· Having multiple sexual partners. °· Having had previous trichomoniasis infections or other STIs. ° °What are the signs or symptoms? °In women, symptoms of trichomoniasis include: °· Abnormal vaginal discharge that is clear, white, gray, or yellow-green and foamy and has an unusual "fishy" odor. °· Itching and irritation of the vagina and vulva. °· Burning or pain during urination or sexual intercourse. °· Genital redness and swelling. ° °In men, symptoms of trichomoniasis include: °· Penile discharge that may be foamy or contain pus. °· Pain in the penis. This may happen only when urinating. °· Itching or irritation inside the penis. °· Burning after urination or ejaculation. ° °How is this diagnosed? °In women, this condition may be found during a routine Pap test or physical exam. It may be found in men during a routine physical exam. Your health care provider may perform tests to help diagnose this infection, such as: °· Urine tests (men and women). °· The following in women: °? Testing the pH of the vagina. °? A vaginal swab test that checks for the Trichomonas vaginalis organism. °? Testing vaginal  secretions. ° °Your health care provider may test you for other STIs, including HIV (human immunodeficiency virus). °How is this treated? °This condition is treated with medicine taken by mouth (orally), such as metronidazole or tinidazole to fight the infection. Your sexual partner(s) may also need to be tested and treated. °· If you are a woman and you plan to become pregnant or think you may be pregnant, tell your health care provider right away. Some medicines that are used to treat the infection should not be taken during pregnancy. ° °Your health care provider may recommend over-the-counter medicines or creams to help relieve itching or irritation. You may be tested for infection again 3 months after treatment. °Follow these instructions at home: °· Take and use over-the-counter and prescription medicines, including creams, only as told by your health care provider. °· Do not have sexual intercourse until one week after you finish your medicine, or until your health care provider approves. Ask your health care provider when you may resume sexual intercourse. °· (Women) Do not douche or wear tampons while you have the infection. °· Discuss your infection with your sexual partner(s). Make sure that your partner gets tested and treated, if necessary. °· Keep all follow-up visits as told by your health care provider. This is important. °How is this prevented? °· Use condoms every time you have sex. Using condoms correctly and consistently can help protect against STIs. °· Avoid having multiple sexual partners. °· Talk with your sexual partner about any symptoms that either of you may have, as well as any history of STIs. °· Get tested for STIs and STDs (sexually transmitted diseases) before you have sex. Ask your partner   to do the same. °· Do not have sexual contact if you have symptoms of trichomoniasis or another STI. °Contact a health care provider if: °· You still have symptoms after you finish your  medicine. °· You develop pain in your abdomen. °· You have pain when you urinate. °· You have bleeding after sexual intercourse. °· You develop a rash. °· You feel nauseous or you vomit. °· You plan to become pregnant or think you may be pregnant. °Summary °· Trichomoniasis is an STI (sexually transmitted infection) that can affect both women and men. °· This condition often has no symptoms (is asymptomatic), especially in men. °· You should not have sexual intercourse until one week after you finish your medicine, or until your health care provider approves. Ask your health care provider when you may resume sexual intercourse. °· Discuss your infection with your sexual partner. Make sure that your partner gets tested and treated, if necessary. °This information is not intended to replace advice given to you by your health care provider. Make sure you discuss any questions you have with your health care provider. °Document Released: 03/11/2001 Document Revised: 08/08/2016 Document Reviewed: 08/08/2016 °Elsevier Interactive Patient Education © 2017 [employer] ° °

## 2018-08-18 NOTE — Progress Notes (Signed)
Patient ID: [name], female   DOB: 11-20-1945, 72 y.o.   MRN: [id number]    [facility] Visit  @DATE @            Patient name: [name] MRN [id number]  Date of birth: 22-Dec-1945  CC & HPI:  [name] is a 72 y.o. female presenting today for POC for trichomonal vaginitis. [name] is accompanied by her husband. They haven't resumed sexual activity  ROS:  ROS -fever -chills All systems are negative except as noted in the HPI and PMH.   Pertinent History Reviewed:   Reviewed: Medical         Past Medical History:  Diagnosis Date  . Anemia   . Arthritis   . Asthma   . Atrial fibrillation ([place])   . Atrial flutter ([place])    a. s/p TEE/DCCV December 2013 b. 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 Bone spur of toe    Right 5th toe  . Chronic diastolic CHF (congestive heart failure) (HCC)    a. EF 50-55% by echo in 2015  . Colon polyposis   . COPD (chronic obstructive pulmonary disease) ([place])   . DVT of upper extremity (deep vein thrombosis) ([place])    Right basilic vein, June 1448  . ESRD (end stage renal disease) on dialysis ([place])   . Essential hypertension, benign   . Fractures   . Glaucoma   . Lupus ([place])   . Pericarditis   . SLE (systemic lupus erythematosus) ([place])   . Type 2 diabetes mellitus ([place])   . UTI (lower urinary tract infection)   . Wound of right leg 06/2017                              Surgical Hx:    Past Surgical History:  Procedure Laterality Date  . ABDOMINAL AORTOGRAM W/LOWER EXTREMITY N/A 12/18/2017   Procedure: ABDOMINAL AORTOGRAM W/LOWER EXTREMITY;  Surgeon: Elam Dutch, MD;  Location: [facility];  Service: Cardiovascular;  Laterality: N/A;  bilateral  . ABDOMINAL HYSTERECTOMY  1983  . AMPUTATION Right 06/08/2018   Procedure:  RIGHT ABOVE KNEE AMPUTATION;  Surgeon: Elam Dutch, MD;  Location: [facility];  Service: Vascular;  Laterality: Right;  . ANKLE SURGERY  1993  . AV FISTULA PLACEMENT Left 03/27/2014   Procedure: ARTERIOVENOUS (AV) FISTULA CREATION;  Surgeon: Rosetta Posner, MD;  Location: [facility];  Service: Vascular;  Laterality: Left;  . BACK SURGERY  1980  . BASCILIC VEIN TRANSPOSITION Left 01/13/2018   Procedure: LEFT BASILIC VEIN TRANSPOSITION SECOND STAGE;  Surgeon: Elam Dutch, MD;  Location: [facility];  Service: Vascular;  Laterality: Left;  . CARDIOVASCULAR STRESS TEST  12/19/2009   no stress induced rhythm abnormalities, ekg negative for ischemia  . CARDIOVERSION  09/16/2012   Procedure: CARDIOVERSION;  Surgeon: Sanda Klein, MD;  [facility];  Service: Cardiovascular;  Laterality: N/A;  . CATARACT EXTRACTION    . COLONOSCOPY  09/20/2012   Dr. [name]: multiple tubular adenomas   . COLONOSCOPY  2005   Dr. [name]. Polyps, path unknown   . COLONOSCOPY N/A 07/24/2016   Dr. [name]: 3 significant size polyps removed from the colon, tubular adenomas.  Next colonoscopy in October 2020.  Marland Kitchen ESOPHAGOGASTRODUODENOSCOPY  09/20/2012   Dr. [name]: duodenal erosion and possible resolving ulcer at the angularis   . ESOPHAGOGASTRODUODENOSCOPY N/A 03/24/2018   Dr. [name]: Mild erosive reflux esophagitis, erosive gastropathy and enteropathy fairly extensive, no H. pylori on biopsy.  Marland Kitchen EYE SURGERY    . IR FLUORO GUIDE CV LINE RIGHT  11/09/2017  . IR US GUIDE VASC ACCESS RIGHT  11/09/2017  . REVISON OF ARTERIOVENOUS FISTULA Left 03/15/2018   Procedure: REVISION OF Left arm BASILIC VEIN TRANSPOSITION;  Surgeon: Angelia Mould, MD;  Location: [facility];  Service: Vascular;  Laterality: Left;  . TEE WITHOUT CARDIOVERSION  09/16/2012   Procedure: TRANSESOPHAGEAL ECHOCARDIOGRAM (TEE);  Surgeon: Sanda Klein, MD;  Location: [facility];  Service: Cardiovascular;  Laterality: N/A;  . TRANSESOPHAGEAL  ECHOCARDIOGRAM WITH CARDIOVERSION  09/16/2012   EF 60-65%, moderate LVH, moderate regurg of the aortic valve, LA moderately dilated   Medications: Reviewed & Updated - see associated section                       Current Outpatient Medications:  .  acetaminophen (TYLENOL) 325 MG tablet, Take 1-2 tablets (325-650 mg total) by mouth every 4 (four) hours as needed for mild pain. (Patient taking differently: Take 1,000 mg by mouth daily as needed for mild pain or moderate pain. ), Disp: , Rfl:  .  albuterol (PROVENTIL HFA;VENTOLIN HFA) 108 (90 BASE) MCG/ACT inhaler, Inhale 2 puffs into the lungs every 6 (six) hours as needed for shortness of breath. , Disp: , Rfl:  .  allopurinol (ZYLOPRIM) 100 MG tablet, Take 100 mg by mouth every Monday, Wednesday, and Friday. , Disp: , Rfl:  .  Amino Acids-Protein Hydrolys (FEEDING SUPPLEMENT, PRO-STAT SUGAR FREE 64,) LIQD, Take 30 mLs by mouth 2 (two) times daily., Disp: 900 mL, Rfl: 0 .  amiodarone (PACERONE) 200 MG tablet, Take 1 tablet (200 mg total) by mouth daily., Disp: 90 tablet, Rfl: 3 .  apixaban (ELIQUIS) 5 MG TABS tablet, Take 1 tablet (5 mg total) by mouth 2 (two) times daily., Disp: 60 tablet, Rfl: 6 .  atorvastatin (LIPITOR) 20 MG tablet, Take 20 mg by mouth at bedtime. , Disp: , Rfl:  .  B Complex-C-Folic Acid (DIALYVITE 811) 0.8 MG TABS, Take 1 tablet by mouth daily., Disp: , Rfl:  .  brimonidine-timolol (COMBIGAN) 0.2-0.5 % ophthalmic solution, Place 1 drop into the left eye 2 (two) times daily. , Disp: , Rfl:  .  calcium acetate (PHOSLO) 667 MG capsule, Take 667 mg by mouth 3 (three) times daily with meals. , Disp: , Rfl: 4 .  fentaNYL (DURAGESIC - DOSED MCG/HR) 50 MCG/HR, Place 25 mcg onto the skin every 3 (three) days. , Disp: , Rfl:  .  folic acid (FOLVITE) 1 MG tablet, Take 1 mg by mouth daily., Disp: , Rfl:  .  hydroxychloroquine (PLAQUENIL) 200 MG tablet, Take 200 mg by mouth daily. , Disp: , Rfl:  .  Insulin Detemir (LEVEMIR FLEXTOUCH) 100  UNIT/ML Pen, Inject 4 Units into the skin every morning., Disp: 15 mL, Rfl: 11 .  insulin lispro (HUMALOG KWIKPEN) 100 UNIT/ML KiwkPen, Inject 5 Units into the skin 3 (three) times daily with meals as needed (*Only take if blood sugar levels are over 100). , Disp: , Rfl:  .  metoprolol tartrate (LOPRESSOR) 25 MG tablet, Take 1/2 tablet twice a day on Mondays, Wednesdays and Fridays (Patient taking differently: Take 25 mg by mouth See admin instructions. Take 12.5 mg twice  a day on Mondays, Wednesdays and Fridays), Disp: 30 tablet, Rfl: 0 .  Oxycodone HCl 10 MG TABS, Take by mouth., Disp: , Rfl:  .  pantoprazole (PROTONIX) 40 MG tablet, Take 1 tablet (40 mg total) by mouth daily., Disp: 30 tablet, Rfl: 11 .  predniSONE (DELTASONE) 5 MG tablet, Take 5 mg by mouth daily with breakfast., Disp: , Rfl:  .  travoprost, benzalkonium, (TRAVATAN) 0.004 % ophthalmic solution, Place 1 drop into both eyes at bedtime. , Disp: , Rfl:    Social History: Reviewed -  reports that she quit smoking about 5 years ago. Her smoking use included cigarettes. She has a 30.00 pack-year smoking history. She has never used smokeless tobacco.  Objective Findings:  Vitals: There were no vitals taken for this visit.  PHYSICAL EXAMINATION General appearance - alert, well appearing, and in no distress Mental status - alert, oriented to person, place, and time, normal mood, behavior, speech, dress, motor activity, and thought processes, affect appropriate to mood  PELVIC External genitalia - sebaceous cyst on labia majora, stable Vagina - white discharge Uterus - surgically absent [place] - neg trich    Assessment & Plan:   A:  1.  POC trichomonal vaginitis= confirmed  P:  1.  GYN PRN    By signing my name below, I, Samul Dada, attest that this documentation has been prepared under the direction and in the presence of Jonnie Kind, MD. Electronically Signed: [place]. 08/18/18. 1:34  PM.  I personally performed the services described in this documentation, which was SCRIBED in my presence. The recorded information has been reviewed and considered accurate. It has been edited as necessary during review. Jonnie Kind, MD

## 2018-08-19 DIAGNOSIS — N186 End stage renal disease: Secondary | ICD-10-CM | POA: Diagnosis not present

## 2018-08-19 DIAGNOSIS — D689 Coagulation defect, unspecified: Secondary | ICD-10-CM | POA: Diagnosis not present

## 2018-08-19 DIAGNOSIS — N2581 Secondary hyperparathyroidism of renal origin: Secondary | ICD-10-CM | POA: Diagnosis not present

## 2018-08-19 DIAGNOSIS — E1129 Type 2 diabetes mellitus with other diabetic kidney complication: Secondary | ICD-10-CM | POA: Diagnosis not present

## 2018-08-19 DIAGNOSIS — R197 Diarrhea, unspecified: Secondary | ICD-10-CM | POA: Diagnosis not present

## 2018-08-19 DIAGNOSIS — E876 Hypokalemia: Secondary | ICD-10-CM | POA: Diagnosis not present

## 2018-08-19 DIAGNOSIS — R52 Pain, unspecified: Secondary | ICD-10-CM | POA: Diagnosis not present

## 2018-08-19 DIAGNOSIS — D631 Anemia in chronic kidney disease: Secondary | ICD-10-CM | POA: Diagnosis not present

## 2018-08-20 DIAGNOSIS — D631 Anemia in chronic kidney disease: Secondary | ICD-10-CM | POA: Diagnosis not present

## 2018-08-20 DIAGNOSIS — E785 Hyperlipidemia, unspecified: Secondary | ICD-10-CM | POA: Diagnosis not present

## 2018-08-20 DIAGNOSIS — E1122 Type 2 diabetes mellitus with diabetic chronic kidney disease: Secondary | ICD-10-CM | POA: Diagnosis not present

## 2018-08-20 DIAGNOSIS — E1151 Type 2 diabetes mellitus with diabetic peripheral angiopathy without gangrene: Secondary | ICD-10-CM | POA: Diagnosis not present

## 2018-08-20 DIAGNOSIS — J449 Chronic obstructive pulmonary disease, unspecified: Secondary | ICD-10-CM | POA: Diagnosis not present

## 2018-08-20 DIAGNOSIS — I872 Venous insufficiency (chronic) (peripheral): Secondary | ICD-10-CM | POA: Diagnosis not present

## 2018-08-20 DIAGNOSIS — H409 Unspecified glaucoma: Secondary | ICD-10-CM | POA: Diagnosis not present

## 2018-08-20 DIAGNOSIS — Z794 Long term (current) use of insulin: Secondary | ICD-10-CM | POA: Diagnosis not present

## 2018-08-20 DIAGNOSIS — M150 Primary generalized (osteo)arthritis: Secondary | ICD-10-CM | POA: Diagnosis not present

## 2018-08-20 DIAGNOSIS — M109 Gout, unspecified: Secondary | ICD-10-CM | POA: Diagnosis not present

## 2018-08-20 DIAGNOSIS — Z4781 Encounter for orthopedic aftercare following surgical amputation: Secondary | ICD-10-CM | POA: Diagnosis not present

## 2018-08-20 DIAGNOSIS — E1142 Type 2 diabetes mellitus with diabetic polyneuropathy: Secondary | ICD-10-CM | POA: Diagnosis not present

## 2018-08-20 DIAGNOSIS — N186 End stage renal disease: Secondary | ICD-10-CM | POA: Diagnosis not present

## 2018-08-20 DIAGNOSIS — Z992 Dependence on renal dialysis: Secondary | ICD-10-CM | POA: Diagnosis not present

## 2018-08-20 DIAGNOSIS — Z89611 Acquired absence of right leg above knee: Secondary | ICD-10-CM | POA: Diagnosis not present

## 2018-08-20 DIAGNOSIS — E11319 Type 2 diabetes mellitus with unspecified diabetic retinopathy without macular edema: Secondary | ICD-10-CM | POA: Diagnosis not present

## 2018-08-20 DIAGNOSIS — Z86718 Personal history of other venous thrombosis and embolism: Secondary | ICD-10-CM | POA: Diagnosis not present

## 2018-08-20 DIAGNOSIS — M329 Systemic lupus erythematosus, unspecified: Secondary | ICD-10-CM | POA: Diagnosis not present

## 2018-08-20 DIAGNOSIS — I4891 Unspecified atrial fibrillation: Secondary | ICD-10-CM | POA: Diagnosis not present

## 2018-08-20 DIAGNOSIS — M1990 Unspecified osteoarthritis, unspecified site: Secondary | ICD-10-CM | POA: Diagnosis not present

## 2018-08-20 DIAGNOSIS — I129 Hypertensive chronic kidney disease with stage 1 through stage 4 chronic kidney disease, or unspecified chronic kidney disease: Secondary | ICD-10-CM | POA: Diagnosis not present

## 2018-08-20 DIAGNOSIS — Z87891 Personal history of nicotine dependence: Secondary | ICD-10-CM | POA: Diagnosis not present

## 2018-08-21 DIAGNOSIS — D631 Anemia in chronic kidney disease: Secondary | ICD-10-CM | POA: Diagnosis not present

## 2018-08-21 DIAGNOSIS — R52 Pain, unspecified: Secondary | ICD-10-CM | POA: Diagnosis not present

## 2018-08-21 DIAGNOSIS — R197 Diarrhea, unspecified: Secondary | ICD-10-CM | POA: Diagnosis not present

## 2018-08-21 DIAGNOSIS — N2581 Secondary hyperparathyroidism of renal origin: Secondary | ICD-10-CM | POA: Diagnosis not present

## 2018-08-21 DIAGNOSIS — N186 End stage renal disease: Secondary | ICD-10-CM | POA: Diagnosis not present

## 2018-08-21 DIAGNOSIS — D689 Coagulation defect, unspecified: Secondary | ICD-10-CM | POA: Diagnosis not present

## 2018-08-21 DIAGNOSIS — E1129 Type 2 diabetes mellitus with other diabetic kidney complication: Secondary | ICD-10-CM | POA: Diagnosis not present

## 2018-08-21 DIAGNOSIS — E876 Hypokalemia: Secondary | ICD-10-CM | POA: Diagnosis not present

## 2018-08-23 DIAGNOSIS — N186 End stage renal disease: Secondary | ICD-10-CM | POA: Diagnosis not present

## 2018-08-23 DIAGNOSIS — R197 Diarrhea, unspecified: Secondary | ICD-10-CM | POA: Diagnosis not present

## 2018-08-23 DIAGNOSIS — D631 Anemia in chronic kidney disease: Secondary | ICD-10-CM | POA: Diagnosis not present

## 2018-08-23 DIAGNOSIS — N2581 Secondary hyperparathyroidism of renal origin: Secondary | ICD-10-CM | POA: Diagnosis not present

## 2018-08-23 DIAGNOSIS — E1129 Type 2 diabetes mellitus with other diabetic kidney complication: Secondary | ICD-10-CM | POA: Diagnosis not present

## 2018-08-23 DIAGNOSIS — R52 Pain, unspecified: Secondary | ICD-10-CM | POA: Diagnosis not present

## 2018-08-23 DIAGNOSIS — E876 Hypokalemia: Secondary | ICD-10-CM | POA: Diagnosis not present

## 2018-08-23 DIAGNOSIS — D689 Coagulation defect, unspecified: Secondary | ICD-10-CM | POA: Diagnosis not present

## 2018-08-24 DIAGNOSIS — Z89611 Acquired absence of right leg above knee: Secondary | ICD-10-CM | POA: Diagnosis not present

## 2018-08-24 DIAGNOSIS — Z86718 Personal history of other venous thrombosis and embolism: Secondary | ICD-10-CM | POA: Diagnosis not present

## 2018-08-24 DIAGNOSIS — D649 Anemia, unspecified: Secondary | ICD-10-CM | POA: Diagnosis not present

## 2018-08-24 DIAGNOSIS — Z794 Long term (current) use of insulin: Secondary | ICD-10-CM | POA: Diagnosis not present

## 2018-08-24 DIAGNOSIS — Z7952 Long term (current) use of systemic steroids: Secondary | ICD-10-CM | POA: Diagnosis not present

## 2018-08-24 DIAGNOSIS — I4891 Unspecified atrial fibrillation: Secondary | ICD-10-CM | POA: Diagnosis not present

## 2018-08-24 DIAGNOSIS — D631 Anemia in chronic kidney disease: Secondary | ICD-10-CM | POA: Diagnosis not present

## 2018-08-24 DIAGNOSIS — E785 Hyperlipidemia, unspecified: Secondary | ICD-10-CM | POA: Diagnosis not present

## 2018-08-24 DIAGNOSIS — M150 Primary generalized (osteo)arthritis: Secondary | ICD-10-CM | POA: Diagnosis not present

## 2018-08-24 DIAGNOSIS — M159 Polyosteoarthritis, unspecified: Secondary | ICD-10-CM | POA: Diagnosis not present

## 2018-08-24 DIAGNOSIS — M79643 Pain in unspecified hand: Secondary | ICD-10-CM | POA: Diagnosis not present

## 2018-08-24 DIAGNOSIS — Z87891 Personal history of nicotine dependence: Secondary | ICD-10-CM | POA: Diagnosis not present

## 2018-08-24 DIAGNOSIS — I872 Venous insufficiency (chronic) (peripheral): Secondary | ICD-10-CM | POA: Diagnosis not present

## 2018-08-24 DIAGNOSIS — M109 Gout, unspecified: Secondary | ICD-10-CM | POA: Diagnosis not present

## 2018-08-24 DIAGNOSIS — E1151 Type 2 diabetes mellitus with diabetic peripheral angiopathy without gangrene: Secondary | ICD-10-CM | POA: Diagnosis not present

## 2018-08-24 DIAGNOSIS — N186 End stage renal disease: Secondary | ICD-10-CM | POA: Diagnosis not present

## 2018-08-24 DIAGNOSIS — E1122 Type 2 diabetes mellitus with diabetic chronic kidney disease: Secondary | ICD-10-CM | POA: Diagnosis not present

## 2018-08-24 DIAGNOSIS — Z992 Dependence on renal dialysis: Secondary | ICD-10-CM | POA: Diagnosis not present

## 2018-08-24 DIAGNOSIS — M1990 Unspecified osteoarthritis, unspecified site: Secondary | ICD-10-CM | POA: Diagnosis not present

## 2018-08-24 DIAGNOSIS — E1142 Type 2 diabetes mellitus with diabetic polyneuropathy: Secondary | ICD-10-CM | POA: Diagnosis not present

## 2018-08-24 DIAGNOSIS — J449 Chronic obstructive pulmonary disease, unspecified: Secondary | ICD-10-CM | POA: Diagnosis not present

## 2018-08-24 DIAGNOSIS — H409 Unspecified glaucoma: Secondary | ICD-10-CM | POA: Diagnosis not present

## 2018-08-24 DIAGNOSIS — E11319 Type 2 diabetes mellitus with unspecified diabetic retinopathy without macular edema: Secondary | ICD-10-CM | POA: Diagnosis not present

## 2018-08-24 DIAGNOSIS — Z4781 Encounter for orthopedic aftercare following surgical amputation: Secondary | ICD-10-CM | POA: Diagnosis not present

## 2018-08-24 DIAGNOSIS — I129 Hypertensive chronic kidney disease with stage 1 through stage 4 chronic kidney disease, or unspecified chronic kidney disease: Secondary | ICD-10-CM | POA: Diagnosis not present

## 2018-08-24 DIAGNOSIS — M329 Systemic lupus erythematosus, unspecified: Secondary | ICD-10-CM | POA: Diagnosis not present

## 2018-08-25 DIAGNOSIS — E1129 Type 2 diabetes mellitus with other diabetic kidney complication: Secondary | ICD-10-CM | POA: Diagnosis not present

## 2018-08-25 DIAGNOSIS — D631 Anemia in chronic kidney disease: Secondary | ICD-10-CM | POA: Diagnosis not present

## 2018-08-25 DIAGNOSIS — R197 Diarrhea, unspecified: Secondary | ICD-10-CM | POA: Diagnosis not present

## 2018-08-25 DIAGNOSIS — D689 Coagulation defect, unspecified: Secondary | ICD-10-CM | POA: Diagnosis not present

## 2018-08-25 DIAGNOSIS — N2581 Secondary hyperparathyroidism of renal origin: Secondary | ICD-10-CM | POA: Diagnosis not present

## 2018-08-25 DIAGNOSIS — N186 End stage renal disease: Secondary | ICD-10-CM | POA: Diagnosis not present

## 2018-08-25 DIAGNOSIS — E876 Hypokalemia: Secondary | ICD-10-CM | POA: Diagnosis not present

## 2018-08-25 DIAGNOSIS — R52 Pain, unspecified: Secondary | ICD-10-CM | POA: Diagnosis not present

## 2018-08-28 DIAGNOSIS — Z992 Dependence on renal dialysis: Secondary | ICD-10-CM | POA: Diagnosis not present

## 2018-08-28 DIAGNOSIS — N186 End stage renal disease: Secondary | ICD-10-CM | POA: Diagnosis not present

## 2018-08-28 DIAGNOSIS — D689 Coagulation defect, unspecified: Secondary | ICD-10-CM | POA: Diagnosis not present

## 2018-08-28 DIAGNOSIS — E876 Hypokalemia: Secondary | ICD-10-CM | POA: Diagnosis not present

## 2018-08-28 DIAGNOSIS — R197 Diarrhea, unspecified: Secondary | ICD-10-CM | POA: Diagnosis not present

## 2018-08-28 DIAGNOSIS — R52 Pain, unspecified: Secondary | ICD-10-CM | POA: Diagnosis not present

## 2018-08-28 DIAGNOSIS — E1129 Type 2 diabetes mellitus with other diabetic kidney complication: Secondary | ICD-10-CM | POA: Diagnosis not present

## 2018-08-28 DIAGNOSIS — N2581 Secondary hyperparathyroidism of renal origin: Secondary | ICD-10-CM | POA: Diagnosis not present

## 2018-08-28 DIAGNOSIS — D631 Anemia in chronic kidney disease: Secondary | ICD-10-CM | POA: Diagnosis not present

## 2018-08-30 DIAGNOSIS — J449 Chronic obstructive pulmonary disease, unspecified: Secondary | ICD-10-CM | POA: Diagnosis not present

## 2018-08-30 DIAGNOSIS — E1122 Type 2 diabetes mellitus with diabetic chronic kidney disease: Secondary | ICD-10-CM | POA: Diagnosis not present

## 2018-08-30 DIAGNOSIS — Z1389 Encounter for screening for other disorder: Secondary | ICD-10-CM | POA: Diagnosis not present

## 2018-08-30 DIAGNOSIS — I872 Venous insufficiency (chronic) (peripheral): Secondary | ICD-10-CM | POA: Diagnosis not present

## 2018-08-30 DIAGNOSIS — D631 Anemia in chronic kidney disease: Secondary | ICD-10-CM | POA: Diagnosis not present

## 2018-08-30 DIAGNOSIS — Z4781 Encounter for orthopedic aftercare following surgical amputation: Secondary | ICD-10-CM | POA: Diagnosis not present

## 2018-08-30 DIAGNOSIS — I96 Gangrene, not elsewhere classified: Secondary | ICD-10-CM | POA: Diagnosis not present

## 2018-08-30 DIAGNOSIS — E1151 Type 2 diabetes mellitus with diabetic peripheral angiopathy without gangrene: Secondary | ICD-10-CM | POA: Diagnosis not present

## 2018-08-30 DIAGNOSIS — Z86718 Personal history of other venous thrombosis and embolism: Secondary | ICD-10-CM | POA: Diagnosis not present

## 2018-08-30 DIAGNOSIS — E785 Hyperlipidemia, unspecified: Secondary | ICD-10-CM | POA: Diagnosis not present

## 2018-08-30 DIAGNOSIS — Z87891 Personal history of nicotine dependence: Secondary | ICD-10-CM | POA: Diagnosis not present

## 2018-08-30 DIAGNOSIS — L8990 Pressure ulcer of unspecified site, unspecified stage: Secondary | ICD-10-CM | POA: Diagnosis not present

## 2018-08-30 DIAGNOSIS — I129 Hypertensive chronic kidney disease with stage 1 through stage 4 chronic kidney disease, or unspecified chronic kidney disease: Secondary | ICD-10-CM | POA: Diagnosis not present

## 2018-08-30 DIAGNOSIS — E11319 Type 2 diabetes mellitus with unspecified diabetic retinopathy without macular edema: Secondary | ICD-10-CM | POA: Diagnosis not present

## 2018-08-30 DIAGNOSIS — M109 Gout, unspecified: Secondary | ICD-10-CM | POA: Diagnosis not present

## 2018-08-30 DIAGNOSIS — M150 Primary generalized (osteo)arthritis: Secondary | ICD-10-CM | POA: Diagnosis not present

## 2018-08-30 DIAGNOSIS — Z992 Dependence on renal dialysis: Secondary | ICD-10-CM | POA: Diagnosis not present

## 2018-08-30 DIAGNOSIS — M1990 Unspecified osteoarthritis, unspecified site: Secondary | ICD-10-CM | POA: Diagnosis not present

## 2018-08-30 DIAGNOSIS — Z794 Long term (current) use of insulin: Secondary | ICD-10-CM | POA: Diagnosis not present

## 2018-08-30 DIAGNOSIS — Z89611 Acquired absence of right leg above knee: Secondary | ICD-10-CM | POA: Diagnosis not present

## 2018-08-30 DIAGNOSIS — M329 Systemic lupus erythematosus, unspecified: Secondary | ICD-10-CM | POA: Diagnosis not present

## 2018-08-30 DIAGNOSIS — E1142 Type 2 diabetes mellitus with diabetic polyneuropathy: Secondary | ICD-10-CM | POA: Diagnosis not present

## 2018-08-30 DIAGNOSIS — H409 Unspecified glaucoma: Secondary | ICD-10-CM | POA: Diagnosis not present

## 2018-08-30 DIAGNOSIS — I4891 Unspecified atrial fibrillation: Secondary | ICD-10-CM | POA: Diagnosis not present

## 2018-08-30 DIAGNOSIS — N186 End stage renal disease: Secondary | ICD-10-CM | POA: Diagnosis not present

## 2018-08-31 ENCOUNTER — Ambulatory Visit: Payer: Medicare Other | Admitting: Nurse Practitioner

## 2018-08-31 DIAGNOSIS — E876 Hypokalemia: Secondary | ICD-10-CM | POA: Diagnosis not present

## 2018-08-31 DIAGNOSIS — N186 End stage renal disease: Secondary | ICD-10-CM | POA: Diagnosis not present

## 2018-08-31 DIAGNOSIS — D689 Coagulation defect, unspecified: Secondary | ICD-10-CM | POA: Diagnosis not present

## 2018-08-31 DIAGNOSIS — E1129 Type 2 diabetes mellitus with other diabetic kidney complication: Secondary | ICD-10-CM | POA: Diagnosis not present

## 2018-08-31 DIAGNOSIS — R197 Diarrhea, unspecified: Secondary | ICD-10-CM | POA: Diagnosis not present

## 2018-08-31 DIAGNOSIS — N2581 Secondary hyperparathyroidism of renal origin: Secondary | ICD-10-CM | POA: Diagnosis not present

## 2018-08-31 DIAGNOSIS — D631 Anemia in chronic kidney disease: Secondary | ICD-10-CM | POA: Diagnosis not present

## 2018-08-31 DIAGNOSIS — R52 Pain, unspecified: Secondary | ICD-10-CM | POA: Diagnosis not present

## 2018-09-01 ENCOUNTER — Ambulatory Visit
Admission: RE | Admit: 2018-09-01 | Discharge: 2018-09-01 | Disposition: A | Discharge status: Home / Self Care | Payer: Medicare Other | Source: Ambulatory Visit | Attending: Urology | Admitting: Urology

## 2018-09-01 ENCOUNTER — Other Ambulatory Visit: Payer: Self-pay | Admitting: Interventional Radiology

## 2018-09-01 DIAGNOSIS — M109 Gout, unspecified: Secondary | ICD-10-CM | POA: Diagnosis not present

## 2018-09-01 DIAGNOSIS — Z86718 Personal history of other venous thrombosis and embolism: Secondary | ICD-10-CM | POA: Diagnosis not present

## 2018-09-01 DIAGNOSIS — Z87891 Personal history of nicotine dependence: Secondary | ICD-10-CM | POA: Diagnosis not present

## 2018-09-01 DIAGNOSIS — Z89611 Acquired absence of right leg above knee: Secondary | ICD-10-CM | POA: Diagnosis not present

## 2018-09-01 DIAGNOSIS — E1122 Type 2 diabetes mellitus with diabetic chronic kidney disease: Secondary | ICD-10-CM | POA: Diagnosis not present

## 2018-09-01 DIAGNOSIS — Z794 Long term (current) use of insulin: Secondary | ICD-10-CM | POA: Diagnosis not present

## 2018-09-01 DIAGNOSIS — N2889 Other specified disorders of kidney and ureter: Secondary | ICD-10-CM

## 2018-09-01 DIAGNOSIS — Z992 Dependence on renal dialysis: Secondary | ICD-10-CM | POA: Diagnosis not present

## 2018-09-01 DIAGNOSIS — I872 Venous insufficiency (chronic) (peripheral): Secondary | ICD-10-CM | POA: Diagnosis not present

## 2018-09-01 DIAGNOSIS — I129 Hypertensive chronic kidney disease with stage 1 through stage 4 chronic kidney disease, or unspecified chronic kidney disease: Secondary | ICD-10-CM | POA: Diagnosis not present

## 2018-09-01 DIAGNOSIS — N186 End stage renal disease: Secondary | ICD-10-CM | POA: Diagnosis not present

## 2018-09-01 DIAGNOSIS — Z4781 Encounter for orthopedic aftercare following surgical amputation: Secondary | ICD-10-CM | POA: Diagnosis not present

## 2018-09-01 DIAGNOSIS — E785 Hyperlipidemia, unspecified: Secondary | ICD-10-CM | POA: Diagnosis not present

## 2018-09-01 DIAGNOSIS — E1142 Type 2 diabetes mellitus with diabetic polyneuropathy: Secondary | ICD-10-CM | POA: Diagnosis not present

## 2018-09-01 DIAGNOSIS — I4891 Unspecified atrial fibrillation: Secondary | ICD-10-CM | POA: Diagnosis not present

## 2018-09-01 DIAGNOSIS — J449 Chronic obstructive pulmonary disease, unspecified: Secondary | ICD-10-CM | POA: Diagnosis not present

## 2018-09-01 DIAGNOSIS — M150 Primary generalized (osteo)arthritis: Secondary | ICD-10-CM | POA: Diagnosis not present

## 2018-09-01 DIAGNOSIS — E1151 Type 2 diabetes mellitus with diabetic peripheral angiopathy without gangrene: Secondary | ICD-10-CM | POA: Diagnosis not present

## 2018-09-01 DIAGNOSIS — M1990 Unspecified osteoarthritis, unspecified site: Secondary | ICD-10-CM | POA: Diagnosis not present

## 2018-09-01 DIAGNOSIS — H409 Unspecified glaucoma: Secondary | ICD-10-CM | POA: Diagnosis not present

## 2018-09-01 DIAGNOSIS — D631 Anemia in chronic kidney disease: Secondary | ICD-10-CM | POA: Diagnosis not present

## 2018-09-01 DIAGNOSIS — M329 Systemic lupus erythematosus, unspecified: Secondary | ICD-10-CM | POA: Diagnosis not present

## 2018-09-01 DIAGNOSIS — E11319 Type 2 diabetes mellitus with unspecified diabetic retinopathy without macular edema: Secondary | ICD-10-CM | POA: Diagnosis not present

## 2018-09-01 HISTORY — PX: IR RADIOLOGIST EVAL & MGMT: IMG5224

## 2018-09-01 NOTE — Consult Note (Addendum)
Chief Complaint: Patient was seen in consultation today for right renal mass  at the request of Winter,Christopher Marjory Lies  Referring Physician(s): Winter,Christopher Marjory Lies  History of Present Illness: [name] is a 72 y.o. female with a complex medical history including peripheral arterial disease and end-stage renal disease requiring hemodialysis.  An upper pole right renal mass was noted on ultrasound abdomen 11/03/2017 obtained for abdominal pain, emesis, and fever.  CT abdomen with contrast 07/28/2018 confirmed 3 cm right upper pole mass suspicious for renal cell carcinoma.  No evidence of renal vein involvement or regional adenopathy. She is scheduled for a transmetatarsal amputation  on Monday by Dr Scot Dock.  Past Medical History:  Diagnosis Date  . Anemia   . Arthritis   . Asthma   . Atrial fibrillation ([place])   . Atrial flutter ([place])    a. s/p TEE/DCCV December 2013 b. 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 Bone spur of toe    Right 5th toe  . Chronic diastolic CHF (congestive heart failure) (HCC)    a. EF 50-55% by echo in 2015  . Colon polyposis   . COPD (chronic obstructive pulmonary disease) ([place])   . DVT of upper extremity (deep vein thrombosis) ([place])    Right basilic vein, June 5956  . ESRD (end stage renal disease) on dialysis ([place])   . Essential hypertension, benign   . Fractures   . Glaucoma   . Lupus ([place])   . Pericarditis   . SLE (systemic lupus erythematosus) ([place])   . Type 2 diabetes mellitus ([place])   . UTI (lower urinary tract infection)   . Wound of right leg 06/2017    Past Surgical History:  Procedure Laterality Date  . ABDOMINAL AORTOGRAM W/LOWER EXTREMITY N/A 12/18/2017   Procedure: ABDOMINAL AORTOGRAM W/LOWER EXTREMITY;  Surgeon: Elam Dutch, MD;   Location: [facility];  Service: Cardiovascular;  Laterality: N/A;  bilateral  . ABDOMINAL HYSTERECTOMY  1983  . AMPUTATION Right 06/08/2018   Procedure: RIGHT ABOVE KNEE AMPUTATION;  Surgeon: Elam Dutch, MD;  Location: [facility];  Service: Vascular;  Laterality: Right;  . ANKLE SURGERY  1993  . AV FISTULA PLACEMENT Left 03/27/2014   Procedure: ARTERIOVENOUS (AV) FISTULA CREATION;  Surgeon: Rosetta Posner, MD;  Location: [facility];  Service: Vascular;  Laterality: Left;  . BACK SURGERY  1980  . BASCILIC VEIN TRANSPOSITION Left 01/13/2018   Procedure: LEFT BASILIC VEIN TRANSPOSITION SECOND STAGE;  Surgeon: Elam Dutch, MD;  Location: [facility];  Service: Vascular;  Laterality: Left;  . CARDIOVASCULAR STRESS TEST  12/19/2009   no stress induced rhythm abnormalities, ekg negative for ischemia  . CARDIOVERSION  09/16/2012   Procedure: CARDIOVERSION;  Surgeon: Sanda Klein, MD;  Location: [facility];  Service: Cardiovascular;  Laterality: N/A;  . CATARACT EXTRACTION    . COLONOSCOPY  09/20/2012   Dr. [name]: multiple tubular adenomas   . COLONOSCOPY  2005   Dr. [name]. Polyps, path unknown   . COLONOSCOPY N/A 07/24/2016   Dr. [name]: 3 significant size polyps removed from the colon, tubular adenomas.  Next colonoscopy in October 2020.  Marland Kitchen ESOPHAGOGASTRODUODENOSCOPY  09/20/2012   Dr. [name]: duodenal erosion and possible resolving ulcer at the angularis   . ESOPHAGOGASTRODUODENOSCOPY N/A 03/24/2018   Dr. [name]: Mild erosive  reflux esophagitis, erosive gastropathy and enteropathy fairly extensive, no H. pylori on biopsy.  Marland Kitchen EYE SURGERY    . IR FLUORO GUIDE CV LINE RIGHT  11/09/2017  . IR RADIOLOGIST EVAL & MGMT  09/01/2018  . IR US GUIDE VASC ACCESS RIGHT  11/09/2017  . REVISON OF ARTERIOVENOUS FISTULA Left 03/15/2018   Procedure: REVISION OF Left arm BASILIC VEIN TRANSPOSITION;  Surgeon: Angelia Mould, MD;  Location: [facility];  Service: Vascular;  Laterality: Left;  . TEE WITHOUT  CARDIOVERSION  09/16/2012   Procedure: TRANSESOPHAGEAL ECHOCARDIOGRAM (TEE);  Surgeon: Sanda Klein, MD;  Location: [facility];  Service: Cardiovascular;  Laterality: N/A;  . TRANSESOPHAGEAL ECHOCARDIOGRAM WITH CARDIOVERSION  09/16/2012   EF 60-65%, moderate LVH, moderate regurg of the aortic valve, LA moderately dilated    Allergies: Ace inhibitors  Medications: Prior to Admission medications   Medication Sig Start Date End Date Taking? Authorizing Provider  acetaminophen (TYLENOL) 325 MG tablet Take 1-2 tablets (325-650 mg total) by mouth every 4 (four) hours as needed for mild pain. Patient taking differently: Take 1,000 mg by mouth daily as needed for mild pain or moderate pain.  06/24/18   Love, Ivan Anchors, PA-C  albuterol (PROVENTIL HFA;VENTOLIN HFA) 108 (90 BASE) MCG/ACT inhaler Inhale 2 puffs into the lungs every 6 (six) hours as needed for shortness of breath.     [provider]  allopurinol (ZYLOPRIM) 100 MG tablet Take 100 mg by mouth every Monday, Wednesday, and Friday.     [provider]  Amino Acids-Protein Hydrolys (FEEDING SUPPLEMENT, PRO-STAT SUGAR FREE 64,) LIQD Take 30 mLs by mouth 2 (two) times daily. 12/29/17   Danford, Suann Larry, MD  amiodarone (PACERONE) 200 MG tablet Take 1 tablet (200 mg total) by mouth daily. 08/04/18   Strader, Fransisco Hertz, PA-C  apixaban (ELIQUIS) 5 MG TABS tablet Take 1 tablet (5 mg total) by mouth 2 (two) times daily. 12/29/17   Danford, Suann Larry, MD  atorvastatin (LIPITOR) 20 MG tablet Take 20 mg by mouth at bedtime.     [provider]  B Complex-C-Folic Acid (DIALYVITE 831) 0.8 MG TABS Take 1 tablet by mouth daily.    [provider]  brimonidine-timolol (COMBIGAN) 0.2-0.5 % ophthalmic solution Place 1 drop into the left eye 2 (two) times daily.     [provider]  calcium acetate (PHOSLO) 667 MG capsule Take 667 mg by mouth 3 (three) times daily with meals.  07/01/18   [provider]    Control Gel Formula Dressing (DUODERM CGF BORDER DRESSING EX) Place 1 patch onto the skin daily. Applied to left hip    [provider]  fentaNYL (DURAGESIC - DOSED MCG/HR) 50 MCG/HR Place 25 mcg onto the skin every 3 (three) days.     [provider]  folic acid (FOLVITE) 1 MG tablet Take 1 mg by mouth daily.    [provider]  hydroxychloroquine (PLAQUENIL) 200 MG tablet Take 200 mg by mouth daily.     [provider]  Insulin Detemir (LEVEMIR FLEXTOUCH) 100 UNIT/ML Pen Inject 4 Units into the skin every morning. Patient taking differently: Inject 10 Units into the skin daily with breakfast.  07/30/18   Denton Brick, Courage, MD  insulin lispro (HUMALOG KWIKPEN) 100 UNIT/ML KiwkPen Inject 5 Units into the skin 3 (three) times daily with meals as needed (*Only take if blood sugar levels are over 100).     [provider]  metoprolol tartrate (LOPRESSOR) 25 MG tablet Take  1/2 tablet twice a day on Mondays, Wednesdays and Fridays Patient taking differently: Take 12.5 mg by mouth See admin instructions. Take 12.5 mg twice a day on Mondays, Wednesdays and Fridays 06/24/18   Love, Ivan Anchors, PA-C  Oxycodone HCl 10 MG TABS Take 10 mg by mouth every 6 (six) hours as needed (pain.).     [provider]  pantoprazole (PROTONIX) 40 MG tablet Take 1 tablet (40 mg total) by mouth daily. 03/29/18   Rourk, [name], MD  predniSONE (DELTASONE) 5 MG tablet Take 5 mg by mouth daily with breakfast.    [provider]  travoprost, benzalkonium, (TRAVATAN) 0.004 % ophthalmic solution Place 1 drop into both eyes at bedtime.     [provider]     Family History  Problem Relation Age of Onset  . Diabetes Brother   . Diabetes Father   . Hypertension Father   . Hypertension Sister   . Stroke Mother   . Diabetes Sister   . Hypertension Brother   . Colon cancer Neg Hx     Social History   Socioeconomic History  . Marital status: Married     Spouse name: Not on file  . Number of children: Not on file  . Years of education: Not on file  . Highest education level: Not on file  Occupational History  . Not on file  Social Needs  . Financial resource strain: Not on file  . Food insecurity:    Worry: Not on file    Inability: Not on file  . Transportation needs:    Medical: Not on file    Non-medical: Not on file  Tobacco Use  . Smoking status: Former Smoker    Packs/day: 1.00    Years: 30.00    Pack years: 30.00    Types: Cigarettes    Last attempt to quit: 08/29/2012    Years since quitting: 6.0  . Smokeless tobacco: Never Used  . Tobacco comment: quit 08-2012  Substance and Sexual Activity  . Alcohol use: No    Alcohol/week: 0.0 standard drinks  . Drug use: No  . Sexual activity: Yes    Partners: Male    Birth control/protection: Surgical    Comment: hyst  Lifestyle  . Physical activity:    Days per week: Not on file    Minutes per session: Not on file  . Stress: Not on file  Relationships  . Social connections:    Talks on phone: Not on file    Gets together: Not on file    Attends religious service: Not on file    Active member of club or organization: Not on file    Attends meetings of clubs or organizations: Not on file    Relationship status: Not on file  Other Topics Concern  . Not on file  Social History Narrative  . Not on file    ECOG Status: 1 - Symptomatic but completely ambulatory    Physical Exam Vital Signs: BP (!) 144/41 (BP Location: Right Wrist, Patient Position: Sitting, Cuff Size: Normal)   Pulse 70   Temp 98.1 F (36.7 C)   Resp 12   SpO2 100%  Constitutional: Oriented to person, place, and time. Well-developed and well-nourished. No distress.  Examined in wheelchair. HENT:  Head: Normocephalic and atraumatic.  Eyes: Conjunctivae and EOM are normal. Right eye exhibits no discharge. Left eye exhibits no discharge. No scleral icterus.  Neck: No JVD present.   Pulmonary/Chest: Effort normal.  No stridor. No respiratory distress.  Abdomen: soft, non distended Neurological:  alert and oriented to person, place, and time.  Skin: Skin is warm and dry.  not diaphoretic.  Psychiatric:   normal mood and affect.   behavior is normal. Judgment and thought content normal.  Review of Systems Review of Systems: A 12 point ROS discussed and pertinent positives are indicated in the HPI above.  All other systems are negative.   Mallampati Score:     Imaging: ABDOMEN ULTRASOUND COMPLETE  COMPARISON: Abdominal CT 11/02/2017  FINDINGS: Gallbladder: Small amount of echogenic sludge within the gallbladder. No gallbladder wall distention or wall thickening. Reportedly, the patient does not have a sonographic Murphy sign. No gallstones.  Common bile duct: Diameter: 0.4 cm.  Liver: No focal lesion identified. Within normal limits in parenchymal echogenicity. Portal vein is patent on color Doppler imaging with normal direction of blood flow towards the liver.  IVC: No abnormality visualized.  Pancreas: Visualized portion unremarkable.  Spleen: Size and appearance within normal limits.  Right Kidney: Length: 11.6 cm. Increased echogenicity in the right kidney without hydronephrosis. There is a indeterminate hypoechoic structure along the lateral upper pole region. This structure measures 2.6 x 2.4 x 2.8 cm. Structure may be partially cystic but cannot exclude a solid component. Review of the prior non contrast CT demonstrates minimal fullness in this area but there is not an obvious lesion based on the non contrast images.  Left Kidney: Length: 10.1 cm. Echogenicity within normal limits. No mass or hydronephrosis visualized.  Abdominal aorta: No aneurysm visualized.  Other findings: None.  IMPRESSION: Indeterminate right renal lesion measuring up to 2.8 cm. This could represent a complex cystic lesion but cannot exclude a solid component.  This lesion is not well characterized on the prior non contrast CTs. Patient is not a candidate for contrast enhanced studies due to renal function but this lesion could be further characterized with a non contrast renal MRI.  Gallbladder sludge without stones or biliary dilatation.   Electronically Signed By: Markus Daft M.D. On: 11/03/2017 11:50    CT ABDOMEN AND PELVIS WITH CONTRAST  TECHNIQUE: Multidetector CT imaging of the abdomen and pelvis was performed using the standard protocol following bolus administration of intravenous contrast.  CONTRAST: 146mL ISOVUE-300 IOPAMIDOL (ISOVUE-300) INJECTION 61%  COMPARISON: 11/02/2017 CT  FINDINGS: Lower chest: No acute abnormality. Cardiomegaly again noted. Coronary artery and aortic atherosclerotic calcifications again identified.  Hepatobiliary: The liver and gallbladder are unremarkable except for small hepatic cysts. No biliary dilatation.  Pancreas: Unremarkable  Spleen: Unremarkable  Adrenals/Urinary Tract: A 3 cm RIGHT UPPER pole mass is suspicious for renal cell carcinoma. No other renal abnormalities noted. No definite renal vein thrombosis. The adrenal glands and bladder are unremarkable.  Stomach/Bowel: Equivocal mild circumferential wall thickening of the RIGHT colon noted which could represent a colitis. No evidence of bowel obstruction. No other bowel wall thickening identified. The appendix is normal.  Vascular/Lymphatic: Aortic atherosclerosis. No enlarged abdominal or pelvic lymph nodes.  Reproductive: Status post hysterectomy. No adnexal masses.  Other: No ascites, focal collection or pneumoperitoneum.  Musculoskeletal: No acute bony abnormalities are identified. Degenerative changes within the lumbar spine noted.  IMPRESSION: 1. Equivocal mild circumferential wall thickening of the RIGHT: Which may represent colitis. No evidence of bowel obstruction, pneumoperitoneum or abscess. 2. 3 cm RIGHT  UPPER pole renal mass again identified, suspicious for renal cell carcinoma. No enlarged lymph nodes, RIGHT adrenal abnormality or venous thrombosis. 3. Cardiomegaly 4. Coronary artery and  Aortic Atherosclerosis (ICD10-I70.0).   Electronically Signed By: Margarette Canada M.D. On: 07/28/2018 17:26    Labs:  CBC: Recent Labs    06/22/18 1557 06/24/18 0821 07/29/18 1211 07/30/18 0455  WBC 9.1 6.0 7.9 7.0  HGB 8.2* 8.0* 10.9* 11.3*  HCT 28.5* 28.2* 38.4 37.5  PLT 226 258 237 252    COAGS: Recent Labs    12/20/17 0640 01/13/18 1218 03/15/18 0628 06/08/18 0752  INR 1.19 1.10 1.05 1.18  APTT  --   --   --  39*    BMP: Recent Labs    06/24/18 1013 07/29/18 1211 07/30/18 0455 08/09/18 1159  NA 130* 134* 133* 140  K 3.6 4.5 4.2 4.4  CL 94* 100 99 99  CO2 24 19* 23 21  GLUCOSE 121* 96 367* 149*  BUN 62* 123* 50* 67*  CALCIUM 8.3* 8.4* 7.9* 8.4*  CREATININE 4.34* 7.81* 4.41* 4.38*  GFRNONAA 9* 5* 9* 9*  GFRAA 11* 5* 11* 11*    LIVER FUNCTION TESTS: Recent Labs    05/05/18 1150 06/08/18 0752  06/24/18 1013 07/29/18 1211 07/30/18 0455 08/09/18 1159  BILITOT 0.3 0.4  --   --  0.5  --  0.2  AST 15 18  --   --  18  --  17  ALT 7 13  --   --  11  --  11  ALKPHOS 101 83  --   --  59  --  76  PROT 6.0 6.6  --   --  6.5  --  6.0  ALBUMIN 2.6* 1.8*   < > 1.9* 2.3* 2.2* 2.5*   < > = values in this interval not displayed.    TUMOR MARKERS: No results for input(s): AFPTM, CEA, CA199, CHROMGRNA in the last 8760 hours.  Assessment and Plan:  My impression is that this patient has a 3 cm upper pole right renal mass most likely renal cell carcinoma.  No evidence of regional or distant metastatic disease.  Resection or ablation could be curative.  Given her chronic renal failure and need for hemodialysis, nephron sparing is not high priority.  Given her comorbidities, she be a poor surgical candidate for nephrectomy or partial nephrectomy.  The lesion is approachable from  a posterior approach for percutaneous ablation, and its size does not exclude this procedure.  I would consider concomitant core biopsy to exclude rare benign lesion such as oncocytoma which would obviate continued surveillance imaging post procedure. I spent a majority of the consultation discussing with the patient pathophysiology of renal cell carcinoma and treatment options.  We touched on nephrectomy, partial nephrectomy, watchful waiting, and percutaneous ablation.  We discussed the in detail the technique of percutaneous cryoablation under CT with anesthesia, with possible hydrodissection to  protect adjacent organs.  We discussed anticipated benefits, possible risks and complications, and need for continued follow-up assuming biopsy is positive for renal cell carcinoma.  She had her husband seem to understand, and did ask appropriate questions, which were answered.  She is motivated to proceed.    Accordingly, we can set  up CT guided cryoablation of her right upper pole renal mass under anesthesia at [facility], with plans for possible overnight observation, at her convenience.  I will touch base with Dr. Scot Dock in vascular surgery for his recommendations regarding how long she might need after the transmetatarsal amputation before undergoing this procedure.  Thank you for this interesting consult.  I greatly enjoyed meeting [name]  and look forward to participating in their care.  A copy of this report was sent to the requesting provider on this date.  Electronically Signed: Rickard Rhymes 09/01/2018, 3:12 PM   I spent a total of  40 Minutes   in face to face in clinical consultation, greater than 50% of which was counseling/coordinating care for right renal mass.

## 2018-09-02 DIAGNOSIS — R52 Pain, unspecified: Secondary | ICD-10-CM | POA: Diagnosis not present

## 2018-09-02 DIAGNOSIS — R197 Diarrhea, unspecified: Secondary | ICD-10-CM | POA: Diagnosis not present

## 2018-09-02 DIAGNOSIS — D631 Anemia in chronic kidney disease: Secondary | ICD-10-CM | POA: Diagnosis not present

## 2018-09-02 DIAGNOSIS — N186 End stage renal disease: Secondary | ICD-10-CM | POA: Diagnosis not present

## 2018-09-02 DIAGNOSIS — N2581 Secondary hyperparathyroidism of renal origin: Secondary | ICD-10-CM | POA: Diagnosis not present

## 2018-09-02 DIAGNOSIS — D689 Coagulation defect, unspecified: Secondary | ICD-10-CM | POA: Diagnosis not present

## 2018-09-02 DIAGNOSIS — E876 Hypokalemia: Secondary | ICD-10-CM | POA: Diagnosis not present

## 2018-09-02 DIAGNOSIS — E1129 Type 2 diabetes mellitus with other diabetic kidney complication: Secondary | ICD-10-CM | POA: Diagnosis not present

## 2018-09-03 ENCOUNTER — Encounter (HOSPITAL_COMMUNITY): Payer: Self-pay | Admitting: *Deleted

## 2018-09-03 ENCOUNTER — Other Ambulatory Visit: Payer: Self-pay | Admitting: *Deleted

## 2018-09-03 ENCOUNTER — Other Ambulatory Visit: Payer: Self-pay | Admitting: "Endocrinology

## 2018-09-03 ENCOUNTER — Other Ambulatory Visit: Payer: Self-pay

## 2018-09-03 NOTE — Progress Notes (Signed)
Spoke with pt's husband Izell [place] for pre-op call. DPR on file. Pt has hx of Atrial fibrillation and CAD. Pt is on Eliquis, instructed to stop 48 hours prior to surgery. Last dose will be tonight's dose.  Pt's cardiologist is Dr. Domenic Polite. Mr.Pasley states pt has not c/o recent chest pain. Pt is a type 2 diabetic. Last A1C was 5.0 on 08/09/18. He states pt's blood sugar is usually around 124. . Instructed him to have pt take 1/2 of her regular dose of Levemir Insulin Monday AM, she will take 5 units and not to take Humulog insulin Monday AM. Instructed Mr. Mensch have pt check her blood sugar when she gets up Monday AM and every 2 hours until she leaves for the hospital. If blood sugar is 70 or below, treat with 1/2 cup of clear juice (apple or cranberry) and recheck blood sugar 15 minutes after drinking juice. If blood sugar continues to be 70 or below, call the Short Stay department and ask to speak to a nurse. Mr. Fors voiced understanding.

## 2018-09-04 DIAGNOSIS — R52 Pain, unspecified: Secondary | ICD-10-CM | POA: Diagnosis not present

## 2018-09-04 DIAGNOSIS — N2581 Secondary hyperparathyroidism of renal origin: Secondary | ICD-10-CM | POA: Diagnosis not present

## 2018-09-04 DIAGNOSIS — E1129 Type 2 diabetes mellitus with other diabetic kidney complication: Secondary | ICD-10-CM | POA: Diagnosis not present

## 2018-09-04 DIAGNOSIS — R197 Diarrhea, unspecified: Secondary | ICD-10-CM | POA: Diagnosis not present

## 2018-09-04 DIAGNOSIS — D689 Coagulation defect, unspecified: Secondary | ICD-10-CM | POA: Diagnosis not present

## 2018-09-04 DIAGNOSIS — D631 Anemia in chronic kidney disease: Secondary | ICD-10-CM | POA: Diagnosis not present

## 2018-09-04 DIAGNOSIS — E876 Hypokalemia: Secondary | ICD-10-CM | POA: Diagnosis not present

## 2018-09-04 DIAGNOSIS — N186 End stage renal disease: Secondary | ICD-10-CM | POA: Diagnosis not present

## 2018-09-05 DIAGNOSIS — J449 Chronic obstructive pulmonary disease, unspecified: Secondary | ICD-10-CM | POA: Diagnosis not present

## 2018-09-05 DIAGNOSIS — I509 Heart failure, unspecified: Secondary | ICD-10-CM | POA: Diagnosis not present

## 2018-09-05 DIAGNOSIS — M1990 Unspecified osteoarthritis, unspecified site: Secondary | ICD-10-CM | POA: Diagnosis not present

## 2018-09-06 ENCOUNTER — Encounter (HOSPITAL_COMMUNITY)
Admission: RE | Disposition: A | Discharge status: Skilled Nursing Facility | Payer: Self-pay | Source: Home / Self Care | Attending: Vascular Surgery

## 2018-09-06 ENCOUNTER — Encounter (HOSPITAL_COMMUNITY): Payer: Self-pay | Admitting: Anesthesiology

## 2018-09-06 ENCOUNTER — Inpatient Hospital Stay (HOSPITAL_COMMUNITY): Payer: Medicare Other | Admitting: Anesthesiology

## 2018-09-06 ENCOUNTER — Inpatient Hospital Stay (HOSPITAL_COMMUNITY)
Admission: RE | Admit: 2018-09-06 | Discharge: 2018-09-13 | DRG: 239 | Disposition: A | Discharge status: Skilled Nursing Facility | Payer: Medicare Other | Attending: Vascular Surgery | Admitting: Vascular Surgery

## 2018-09-06 DIAGNOSIS — N186 End stage renal disease: Secondary | ICD-10-CM | POA: Diagnosis not present

## 2018-09-06 DIAGNOSIS — E440 Moderate protein-calorie malnutrition: Secondary | ICD-10-CM | POA: Diagnosis not present

## 2018-09-06 DIAGNOSIS — I5032 Chronic diastolic (congestive) heart failure: Secondary | ICD-10-CM | POA: Diagnosis present

## 2018-09-06 DIAGNOSIS — J4520 Mild intermittent asthma, uncomplicated: Secondary | ICD-10-CM | POA: Diagnosis not present

## 2018-09-06 DIAGNOSIS — L8993 Pressure ulcer of unspecified site, stage 3: Secondary | ICD-10-CM

## 2018-09-06 DIAGNOSIS — Z7952 Long term (current) use of systemic steroids: Secondary | ICD-10-CM

## 2018-09-06 DIAGNOSIS — E785 Hyperlipidemia, unspecified: Secondary | ICD-10-CM | POA: Diagnosis not present

## 2018-09-06 DIAGNOSIS — L89322 Pressure ulcer of left buttock, stage 2: Secondary | ICD-10-CM | POA: Diagnosis not present

## 2018-09-06 DIAGNOSIS — L97503 Non-pressure chronic ulcer of other part of unspecified foot with necrosis of muscle: Secondary | ICD-10-CM | POA: Diagnosis not present

## 2018-09-06 DIAGNOSIS — I951 Orthostatic hypotension: Secondary | ICD-10-CM

## 2018-09-06 DIAGNOSIS — E1152 Type 2 diabetes mellitus with diabetic peripheral angiopathy with gangrene: Principal | ICD-10-CM | POA: Diagnosis present

## 2018-09-06 DIAGNOSIS — J449 Chronic obstructive pulmonary disease, unspecified: Secondary | ICD-10-CM | POA: Diagnosis present

## 2018-09-06 DIAGNOSIS — Z833 Family history of diabetes mellitus: Secondary | ICD-10-CM | POA: Diagnosis not present

## 2018-09-06 DIAGNOSIS — E161 Other hypoglycemia: Secondary | ICD-10-CM | POA: Diagnosis not present

## 2018-09-06 DIAGNOSIS — I120 Hypertensive chronic kidney disease with stage 5 chronic kidney disease or end stage renal disease: Secondary | ICD-10-CM | POA: Diagnosis not present

## 2018-09-06 DIAGNOSIS — Z86010 Personal history of colonic polyps: Secondary | ICD-10-CM

## 2018-09-06 DIAGNOSIS — M2550 Pain in unspecified joint: Secondary | ICD-10-CM | POA: Diagnosis not present

## 2018-09-06 DIAGNOSIS — Z86718 Personal history of other venous thrombosis and embolism: Secondary | ICD-10-CM | POA: Diagnosis not present

## 2018-09-06 DIAGNOSIS — D529 Folate deficiency anemia, unspecified: Secondary | ICD-10-CM | POA: Diagnosis not present

## 2018-09-06 DIAGNOSIS — K219 Gastro-esophageal reflux disease without esophagitis: Secondary | ICD-10-CM | POA: Diagnosis not present

## 2018-09-06 DIAGNOSIS — Z89611 Acquired absence of right leg above knee: Secondary | ICD-10-CM

## 2018-09-06 DIAGNOSIS — M6281 Muscle weakness (generalized): Secondary | ICD-10-CM | POA: Diagnosis not present

## 2018-09-06 DIAGNOSIS — L89892 Pressure ulcer of other site, stage 2: Secondary | ICD-10-CM | POA: Diagnosis not present

## 2018-09-06 DIAGNOSIS — E119 Type 2 diabetes mellitus without complications: Secondary | ICD-10-CM | POA: Diagnosis not present

## 2018-09-06 DIAGNOSIS — Z992 Dependence on renal dialysis: Secondary | ICD-10-CM

## 2018-09-06 DIAGNOSIS — I480 Paroxysmal atrial fibrillation: Secondary | ICD-10-CM | POA: Diagnosis not present

## 2018-09-06 DIAGNOSIS — D62 Acute posthemorrhagic anemia: Secondary | ICD-10-CM | POA: Diagnosis not present

## 2018-09-06 DIAGNOSIS — Z87891 Personal history of nicotine dependence: Secondary | ICD-10-CM

## 2018-09-06 DIAGNOSIS — Z79899 Other long term (current) drug therapy: Secondary | ICD-10-CM | POA: Diagnosis not present

## 2018-09-06 DIAGNOSIS — M329 Systemic lupus erythematosus, unspecified: Secondary | ICD-10-CM | POA: Diagnosis present

## 2018-09-06 DIAGNOSIS — I739 Peripheral vascular disease, unspecified: Secondary | ICD-10-CM | POA: Diagnosis present

## 2018-09-06 DIAGNOSIS — Z4781 Encounter for orthopedic aftercare following surgical amputation: Secondary | ICD-10-CM | POA: Diagnosis not present

## 2018-09-06 DIAGNOSIS — Z89432 Acquired absence of left foot: Secondary | ICD-10-CM | POA: Diagnosis not present

## 2018-09-06 DIAGNOSIS — N2581 Secondary hyperparathyroidism of renal origin: Secondary | ICD-10-CM | POA: Diagnosis present

## 2018-09-06 DIAGNOSIS — Z888 Allergy status to other drugs, medicaments and biological substances status: Secondary | ICD-10-CM

## 2018-09-06 DIAGNOSIS — Z7401 Bed confinement status: Secondary | ICD-10-CM | POA: Diagnosis not present

## 2018-09-06 DIAGNOSIS — I10 Essential (primary) hypertension: Secondary | ICD-10-CM | POA: Diagnosis not present

## 2018-09-06 DIAGNOSIS — Z7901 Long term (current) use of anticoagulants: Secondary | ICD-10-CM

## 2018-09-06 DIAGNOSIS — H409 Unspecified glaucoma: Secondary | ICD-10-CM | POA: Diagnosis present

## 2018-09-06 DIAGNOSIS — E1142 Type 2 diabetes mellitus with diabetic polyneuropathy: Secondary | ICD-10-CM | POA: Diagnosis present

## 2018-09-06 DIAGNOSIS — Z794 Long term (current) use of insulin: Secondary | ICD-10-CM

## 2018-09-06 DIAGNOSIS — E1140 Type 2 diabetes mellitus with diabetic neuropathy, unspecified: Secondary | ICD-10-CM | POA: Diagnosis not present

## 2018-09-06 DIAGNOSIS — I132 Hypertensive heart and chronic kidney disease with heart failure and with stage 5 chronic kidney disease, or end stage renal disease: Secondary | ICD-10-CM | POA: Diagnosis not present

## 2018-09-06 DIAGNOSIS — R279 Unspecified lack of coordination: Secondary | ICD-10-CM | POA: Diagnosis not present

## 2018-09-06 DIAGNOSIS — Z79891 Long term (current) use of opiate analgesic: Secondary | ICD-10-CM

## 2018-09-06 DIAGNOSIS — E11319 Type 2 diabetes mellitus with unspecified diabetic retinopathy without macular edema: Secondary | ICD-10-CM | POA: Diagnosis not present

## 2018-09-06 DIAGNOSIS — G8918 Other acute postprocedural pain: Secondary | ICD-10-CM | POA: Diagnosis not present

## 2018-09-06 DIAGNOSIS — E1122 Type 2 diabetes mellitus with diabetic chronic kidney disease: Secondary | ICD-10-CM | POA: Diagnosis present

## 2018-09-06 DIAGNOSIS — M109 Gout, unspecified: Secondary | ICD-10-CM | POA: Diagnosis not present

## 2018-09-06 DIAGNOSIS — R21 Rash and other nonspecific skin eruption: Secondary | ICD-10-CM | POA: Diagnosis not present

## 2018-09-06 DIAGNOSIS — I4891 Unspecified atrial fibrillation: Secondary | ICD-10-CM | POA: Diagnosis not present

## 2018-09-06 DIAGNOSIS — D638 Anemia in other chronic diseases classified elsewhere: Secondary | ICD-10-CM | POA: Diagnosis not present

## 2018-09-06 HISTORY — PX: TRANSMETATARSAL AMPUTATION: SHX6197

## 2018-09-06 LAB — BASIC METABOLIC PANEL
Anion gap: 14 (ref 5–15)
BUN: 54 mg/dL — ABNORMAL HIGH (ref 8–23)
CO2: 26 mmol/L (ref 22–32)
Calcium: 8.2 mg/dL — ABNORMAL LOW (ref 8.9–10.3)
Chloride: 98 mmol/L (ref 98–111)
Creatinine, Ser: 4.58 mg/dL — ABNORMAL HIGH (ref 0.44–1.00)
GFR calc Af Amer: 10 mL/min — ABNORMAL LOW (ref >60)
GFR calc non Af Amer: 9 mL/min — ABNORMAL LOW (ref >60)
Glucose, Bld: 80 mg/dL (ref 70–99)
Potassium: 3.6 mmol/L (ref 3.5–5.1)
Sodium: 138 mmol/L (ref 135–145)

## 2018-09-06 LAB — GLUCOSE, CAPILLARY
GLUCOSE-CAPILLARY: 54 mg/dL — AB (ref 70–99)
GLUCOSE-CAPILLARY: 68 mg/dL — AB (ref 70–99)
Glucose-Capillary: 63 mg/dL — ABNORMAL LOW (ref 70–99)
Glucose-Capillary: 64 mg/dL — ABNORMAL LOW (ref 70–99)
Glucose-Capillary: 52 mg/dL — ABNORMAL LOW (ref 70–99)
Glucose-Capillary: 71 mg/dL (ref 70–99)
Glucose-Capillary: 77 mg/dL (ref 70–99)
Glucose-Capillary: 89 mg/dL (ref 70–99)

## 2018-09-06 LAB — HEMOGLOBIN A1C
Hgb A1c MFr Bld: 4.5 % — ABNORMAL LOW (ref 4.8–5.6)
Mean Plasma Glucose: 82.45 mg/dL

## 2018-09-06 LAB — CBC
HCT: 30.6 % — ABNORMAL LOW (ref 36.0–46.0)
HEMOGLOBIN: 9 g/dL — AB (ref 12.0–15.0)
MCH: 27.6 pg (ref 26.0–34.0)
MCHC: 29.4 g/dL — ABNORMAL LOW (ref 30.0–36.0)
MCV: 93.9 fL (ref 80.0–100.0)
Platelets: 230 10*3/uL (ref 150–400)
RBC: 3.26 MIL/uL — ABNORMAL LOW (ref 3.87–5.11)
RDW: 18.4 % — ABNORMAL HIGH (ref 11.5–15.5)
WBC: 7 10*3/uL (ref 4.0–10.5)
nRBC: 0 % (ref 0.0–0.2)

## 2018-09-06 SURGERY — AMPUTATION, FOOT, TRANSMETATARSAL
Anesthesia: General | Site: Foot | Laterality: Left

## 2018-09-06 MED ORDER — PROPOFOL 10 MG/ML IV BOLUS
INTRAVENOUS | Status: DC | PRN
  Administered 2018-09-06: 30 mg via INTRAVENOUS
  Administered 2018-09-06: 100 mg via INTRAVENOUS

## 2018-09-06 MED ORDER — ALBUTEROL SULFATE (2.5 MG/3ML) 0.083% IN NEBU: 2.5000 mg | INHALATION_SOLUTION | Freq: Four times a day (QID) | RESPIRATORY_TRACT | Status: DC | PRN

## 2018-09-06 MED ORDER — CHLORHEXIDINE GLUCONATE CLOTH 2 % EX PADS
6.0000 | MEDICATED_PAD | Freq: Every day | CUTANEOUS | Status: DC
  Administered 2018-09-07 – 2018-09-13 (×6): 6 via TOPICAL

## 2018-09-06 MED ORDER — INSULIN ASPART 100 UNIT/ML ~~LOC~~ SOLN
0.0000 [IU] | Freq: Three times a day (TID) | SUBCUTANEOUS | Status: DC
  Administered 2018-09-07 – 2018-09-08 (×3): 3 [IU] via SUBCUTANEOUS

## 2018-09-06 MED ORDER — POTASSIUM CHLORIDE CRYS ER 20 MEQ PO TBCR: 20.0000 meq | EXTENDED_RELEASE_TABLET | Freq: Every day | ORAL | Status: DC | PRN

## 2018-09-06 MED ORDER — CEFAZOLIN SODIUM-DEXTROSE 2-4 GM/100ML-% IV SOLN
INTRAVENOUS | Status: AC
  Filled 2018-09-06: qty 100

## 2018-09-06 MED ORDER — OXYCODONE HCL 5 MG PO TABS
5.0000 mg | ORAL_TABLET | ORAL | Status: DC | PRN
  Administered 2018-09-06: 10 mg via ORAL
  Administered 2018-09-06: 5 mg via ORAL
  Administered 2018-09-07 – 2018-09-13 (×21): 10 mg via ORAL
  Filled 2018-09-06 (×25): qty 2

## 2018-09-06 MED ORDER — LABETALOL HCL 5 MG/ML IV SOLN
10.0000 mg | INTRAVENOUS | Status: DC | PRN
  Filled 2018-09-06: qty 4

## 2018-09-06 MED ORDER — FOLIC ACID 1 MG PO TABS
1.0000 mg | ORAL_TABLET | Freq: Every day | ORAL | Status: DC
Start: 2018-09-06 — End: 2018-09-13
  Administered 2018-09-06 – 2018-09-13 (×8): 1 mg via ORAL
  Filled 2018-09-06 (×8): qty 1

## 2018-09-06 MED ORDER — FENTANYL CITRATE (PF) 100 MCG/2ML IJ SOLN
INTRAMUSCULAR | Status: AC
  Filled 2018-09-06: qty 2

## 2018-09-06 MED ORDER — SODIUM CHLORIDE 0.9 % IV SOLN
INTRAVENOUS | Status: DC
  Administered 2018-09-06 (×2): via INTRAVENOUS

## 2018-09-06 MED ORDER — LIDOCAINE 2% (20 MG/ML) 5 ML SYRINGE
INTRAMUSCULAR | Status: DC | PRN
  Administered 2018-09-06: 100 mg via INTRAVENOUS

## 2018-09-06 MED ORDER — SODIUM CHLORIDE 0.9 % IV SOLN: 250.0000 mL | INTRAVENOUS | Status: DC | PRN

## 2018-09-06 MED ORDER — HYDROXYCHLOROQUINE SULFATE 200 MG PO TABS
200.0000 mg | ORAL_TABLET | Freq: Every day | ORAL | Status: DC
  Administered 2018-09-07 – 2018-09-13 (×7): 200 mg via ORAL
  Filled 2018-09-06 (×7): qty 1

## 2018-09-06 MED ORDER — MAGNESIUM SULFATE 2 GM/50ML IV SOLN: 2.0000 g | Freq: Every day | INTRAVENOUS | Status: DC | PRN

## 2018-09-06 MED ORDER — INSULIN DETEMIR 100 UNIT/ML ~~LOC~~ SOLN
5.0000 [IU] | Freq: Every day | SUBCUTANEOUS | Status: DC
  Administered 2018-09-08: 5 [IU] via SUBCUTANEOUS
  Filled 2018-09-06 (×3): qty 0.05

## 2018-09-06 MED ORDER — MORPHINE SULFATE (PF) 2 MG/ML IV SOLN
1.0000 mg | INTRAVENOUS | Status: DC | PRN
  Administered 2018-09-06 – 2018-09-13 (×15): 2 mg via INTRAVENOUS
  Filled 2018-09-06 (×12): qty 1

## 2018-09-06 MED ORDER — BRIMONIDINE TARTRATE-TIMOLOL 0.2-0.5 % OP SOLN
1.0000 [drp] | Freq: Two times a day (BID) | OPHTHALMIC | Status: DC
  Filled 2018-09-06: qty 5

## 2018-09-06 MED ORDER — SENNOSIDES-DOCUSATE SODIUM 8.6-50 MG PO TABS: 1.0000 | ORAL_TABLET | Freq: Every evening | ORAL | Status: DC | PRN

## 2018-09-06 MED ORDER — ONDANSETRON HCL 4 MG/2ML IJ SOLN
INTRAMUSCULAR | Status: DC | PRN
  Administered 2018-09-06: 4 mg via INTRAVENOUS

## 2018-09-06 MED ORDER — CALCIUM ACETATE (PHOS BINDER) 667 MG PO CAPS
667.0000 mg | ORAL_CAPSULE | Freq: Three times a day (TID) | ORAL | Status: DC
  Administered 2018-09-06 – 2018-09-12 (×15): 667 mg via ORAL
  Filled 2018-09-06 (×15): qty 1

## 2018-09-06 MED ORDER — CEFAZOLIN SODIUM-DEXTROSE 2-4 GM/100ML-% IV SOLN
2.0000 g | INTRAVENOUS | Status: AC
  Administered 2018-09-07: 2 g via INTRAVENOUS
  Filled 2018-09-06: qty 100

## 2018-09-06 MED ORDER — PHENOL 1.4 % MT LIQD: 1.0000 | OROMUCOSAL | Status: DC | PRN

## 2018-09-06 MED ORDER — PANTOPRAZOLE SODIUM 40 MG PO TBEC
40.0000 mg | DELAYED_RELEASE_TABLET | Freq: Every day | ORAL | Status: DC
  Administered 2018-09-07 – 2018-09-13 (×7): 40 mg via ORAL
  Filled 2018-09-06 (×7): qty 1

## 2018-09-06 MED ORDER — ATORVASTATIN CALCIUM 10 MG PO TABS
20.0000 mg | ORAL_TABLET | Freq: Every day | ORAL | Status: DC
  Administered 2018-09-06 – 2018-09-12 (×7): 20 mg via ORAL
  Filled 2018-09-06 (×7): qty 2

## 2018-09-06 MED ORDER — CHLORHEXIDINE GLUCONATE CLOTH 2 % EX PADS: 6.0000 | MEDICATED_PAD | Freq: Once | CUTANEOUS | Status: DC

## 2018-09-06 MED ORDER — BISACODYL 5 MG PO TBEC
5.0000 mg | DELAYED_RELEASE_TABLET | Freq: Every day | ORAL | Status: DC | PRN
  Administered 2018-09-08: 5 mg via ORAL
  Filled 2018-09-06: qty 1

## 2018-09-06 MED ORDER — BACITRACIN ZINC 500 UNIT/GM EX OINT
TOPICAL_OINTMENT | CUTANEOUS | Status: DC | PRN
  Administered 2018-09-06: 1 via TOPICAL

## 2018-09-06 MED ORDER — AMIODARONE HCL 100 MG PO TABS
200.0000 mg | ORAL_TABLET | Freq: Every day | ORAL | Status: DC
  Administered 2018-09-07 – 2018-09-13 (×7): 200 mg via ORAL
  Filled 2018-09-06 (×7): qty 2

## 2018-09-06 MED ORDER — TRAVOPROST (BAK FREE) 0.004 % OP SOLN
1.0000 [drp] | Freq: Every day | OPHTHALMIC | Status: DC
  Administered 2018-09-06 – 2018-09-12 (×7): 1 [drp] via OPHTHALMIC
  Filled 2018-09-06: qty 2.5

## 2018-09-06 MED ORDER — ALUM & MAG HYDROXIDE-SIMETH 200-200-20 MG/5ML PO SUSP: 15.0000 mL | ORAL | Status: DC | PRN

## 2018-09-06 MED ORDER — SODIUM CHLORIDE 0.9 % IV SOLN
INTRAVENOUS | Status: DC | PRN
  Administered 2018-09-06: 20 ug/min via INTRAVENOUS

## 2018-09-06 MED ORDER — EPHEDRINE SULFATE-NACL 50-0.9 MG/10ML-% IV SOSY
PREFILLED_SYRINGE | INTRAVENOUS | Status: DC | PRN
  Administered 2018-09-06: 10 mg via INTRAVENOUS

## 2018-09-06 MED ORDER — ACETAMINOPHEN 325 MG PO TABS: 325.0000 mg | ORAL_TABLET | ORAL | Status: DC | PRN

## 2018-09-06 MED ORDER — BRIMONIDINE TARTRATE 0.2 % OP SOLN
1.0000 [drp] | Freq: Two times a day (BID) | OPHTHALMIC | Status: DC
  Administered 2018-09-06 – 2018-09-13 (×14): 1 [drp] via OPHTHALMIC
  Filled 2018-09-06: qty 5

## 2018-09-06 MED ORDER — ACETAMINOPHEN 650 MG RE SUPP: 325.0000 mg | RECTAL | Status: DC | PRN

## 2018-09-06 MED ORDER — ADULT MULTIVITAMIN W/MINERALS CH
1.0000 | ORAL_TABLET | Freq: Every day | ORAL | Status: DC
  Administered 2018-09-06 – 2018-09-13 (×8): 1 via ORAL
  Filled 2018-09-06 (×8): qty 1

## 2018-09-06 MED ORDER — SODIUM CHLORIDE 0.9% FLUSH
3.0000 mL | Freq: Two times a day (BID) | INTRAVENOUS | Status: DC
  Administered 2018-09-06 – 2018-09-13 (×13): 3 mL via INTRAVENOUS

## 2018-09-06 MED ORDER — APIXABAN 5 MG PO TABS
5.0000 mg | ORAL_TABLET | Freq: Two times a day (BID) | ORAL | Status: DC
  Administered 2018-09-07 – 2018-09-13 (×13): 5 mg via ORAL
  Filled 2018-09-06 (×13): qty 1

## 2018-09-06 MED ORDER — METOPROLOL TARTRATE 5 MG/5ML IV SOLN: 2.0000 mg | INTRAVENOUS | Status: DC | PRN

## 2018-09-06 MED ORDER — FENTANYL CITRATE (PF) 250 MCG/5ML IJ SOLN
INTRAMUSCULAR | Status: AC
  Filled 2018-09-06: qty 5

## 2018-09-06 MED ORDER — HYDRALAZINE HCL 20 MG/ML IJ SOLN: 5.0000 mg | INTRAMUSCULAR | Status: DC | PRN

## 2018-09-06 MED ORDER — PRO-STAT SUGAR FREE PO LIQD
30.0000 mL | Freq: Two times a day (BID) | ORAL | Status: DC
  Administered 2018-09-07 – 2018-09-13 (×8): 30 mL via ORAL
  Filled 2018-09-06 (×9): qty 30

## 2018-09-06 MED ORDER — MEPERIDINE HCL 50 MG/ML IJ SOLN: 6.2500 mg | INTRAMUSCULAR | Status: DC | PRN

## 2018-09-06 MED ORDER — ONDANSETRON HCL 4 MG/2ML IJ SOLN: 4.0000 mg | Freq: Four times a day (QID) | INTRAMUSCULAR | Status: DC | PRN

## 2018-09-06 MED ORDER — GUAIFENESIN-DM 100-10 MG/5ML PO SYRP
15.0000 mL | ORAL_SOLUTION | ORAL | Status: DC | PRN
  Filled 2018-09-06: qty 15

## 2018-09-06 MED ORDER — METOPROLOL TARTRATE 12.5 MG HALF TABLET
12.5000 mg | ORAL_TABLET | ORAL | Status: DC
  Administered 2018-09-06 – 2018-09-13 (×6): 12.5 mg via ORAL
  Filled 2018-09-06 (×6): qty 1

## 2018-09-06 MED ORDER — ALLOPURINOL 100 MG PO TABS
100.0000 mg | ORAL_TABLET | ORAL | Status: DC
  Administered 2018-09-06 – 2018-09-13 (×4): 100 mg via ORAL
  Filled 2018-09-06 (×7): qty 1

## 2018-09-06 MED ORDER — CEFAZOLIN SODIUM-DEXTROSE 2-3 GM-%(50ML) IV SOLR
INTRAVENOUS | Status: DC | PRN
  Administered 2018-09-06: 2 g via INTRAVENOUS

## 2018-09-06 MED ORDER — FENTANYL 25 MCG/HR TD PT72
25.0000 ug | MEDICATED_PATCH | TRANSDERMAL | Status: DC
  Administered 2018-09-06 – 2018-09-12 (×3): 25 ug via TRANSDERMAL
  Filled 2018-09-06 (×3): qty 1

## 2018-09-06 MED ORDER — 0.9 % SODIUM CHLORIDE (POUR BTL) OPTIME
TOPICAL | Status: DC | PRN
  Administered 2018-09-06: 1000 mL

## 2018-09-06 MED ORDER — DARBEPOETIN ALFA 200 MCG/0.4ML IJ SOSY
200.0000 ug | PREFILLED_SYRINGE | INTRAMUSCULAR | Status: DC
  Administered 2018-09-07: 200 ug via INTRAVENOUS
  Filled 2018-09-06: qty 0.4

## 2018-09-06 MED ORDER — FENTANYL CITRATE (PF) 100 MCG/2ML IJ SOLN
25.0000 ug | INTRAMUSCULAR | Status: AC | PRN
  Administered 2018-09-06 (×6): 25 ug via INTRAVENOUS

## 2018-09-06 MED ORDER — DOCUSATE SODIUM 100 MG PO CAPS
100.0000 mg | ORAL_CAPSULE | Freq: Every day | ORAL | Status: DC
  Administered 2018-09-07 – 2018-09-13 (×6): 100 mg via ORAL
  Filled 2018-09-06 (×7): qty 1

## 2018-09-06 MED ORDER — PREDNISONE 5 MG PO TABS
5.0000 mg | ORAL_TABLET | Freq: Every day | ORAL | Status: DC
  Administered 2018-09-07 – 2018-09-13 (×7): 5 mg via ORAL
  Filled 2018-09-06 (×6): qty 1

## 2018-09-06 MED ORDER — OXYCODONE HCL 5 MG PO TABS
ORAL_TABLET | ORAL | Status: AC
  Filled 2018-09-06: qty 1

## 2018-09-06 MED ORDER — SODIUM CHLORIDE 0.9% FLUSH: 3.0000 mL | INTRAVENOUS | Status: DC | PRN

## 2018-09-06 MED ORDER — TIMOLOL MALEATE 0.5 % OP SOLN
1.0000 [drp] | Freq: Two times a day (BID) | OPHTHALMIC | Status: DC
  Administered 2018-09-06 – 2018-09-13 (×14): 1 [drp] via OPHTHALMIC
  Filled 2018-09-06: qty 5

## 2018-09-06 MED ORDER — LIDOCAINE 5 % EX PTCH
1.0000 | MEDICATED_PATCH | CUTANEOUS | Status: DC
  Filled 2018-09-06: qty 1

## 2018-09-06 MED ORDER — BACITRACIN ZINC 500 UNIT/GM EX OINT
TOPICAL_OINTMENT | CUTANEOUS | Status: AC
  Filled 2018-09-06: qty 28.35

## 2018-09-06 MED ORDER — FENTANYL CITRATE (PF) 250 MCG/5ML IJ SOLN
INTRAMUSCULAR | Status: DC | PRN
  Administered 2018-09-06: 50 ug via INTRAVENOUS
  Administered 2018-09-06: 25 ug via INTRAVENOUS
  Administered 2018-09-06 (×2): 50 ug via INTRAVENOUS
  Administered 2018-09-06: 25 ug via INTRAVENOUS

## 2018-09-06 SURGICAL SUPPLY — 39 items
BANDAGE ACE 4X5 VEL STRL LF (GAUZE/BANDAGES/DRESSINGS) ×3 IMPLANT
BANDAGE ELASTIC 4 VELCRO ST LF (GAUZE/BANDAGES/DRESSINGS) ×2 IMPLANT
BLADE AVERAGE 25MMX9MM (BLADE) ×1
BLADE AVERAGE 25X9 (BLADE) ×1 IMPLANT
BLADE SURG 10 STRL SS (BLADE) ×2 IMPLANT
BNDG CONFORM 3 STRL LF (GAUZE/BANDAGES/DRESSINGS) ×3 IMPLANT
BNDG GAUZE ELAST 4 BULKY (GAUZE/BANDAGES/DRESSINGS) ×3 IMPLANT
CANISTER SUCT 3000ML PPV (MISCELLANEOUS) ×3 IMPLANT
COVER SURGICAL LIGHT HANDLE (MISCELLANEOUS) ×3 IMPLANT
COVER WAND RF STERILE (DRAPES) ×3 IMPLANT
DRAPE HALF SHEET 40X57 (DRAPES) ×3 IMPLANT
DRAPE ORTHO SPLIT 77X108 STRL (DRAPES) ×6
DRAPE SURG ORHT 6 SPLT 77X108 (DRAPES) ×2 IMPLANT
DRSG EMULSION OIL 3X3 NADH (GAUZE/BANDAGES/DRESSINGS) ×2 IMPLANT
ELECT REM PT RETURN 9FT ADLT (ELECTROSURGICAL) ×3
ELECTRODE REM PT RTRN 9FT ADLT (ELECTROSURGICAL) ×1 IMPLANT
GAUZE SPONGE 4X4 12PLY STRL (GAUZE/BANDAGES/DRESSINGS) ×3 IMPLANT
GLOVE BIO SURGEON STRL SZ 6.5 (GLOVE) ×2 IMPLANT
GLOVE BIO SURGEON STRL SZ7.5 (GLOVE) ×3 IMPLANT
GLOVE BIO SURGEONS STRL SZ 6.5 (GLOVE) ×2
GLOVE BIOGEL PI IND STRL 6.5 (GLOVE) IMPLANT
GLOVE BIOGEL PI IND STRL 8 (GLOVE) ×1 IMPLANT
GLOVE BIOGEL PI INDICATOR 6.5 (GLOVE) ×6
GLOVE BIOGEL PI INDICATOR 8 (GLOVE) ×2
GOWN STRL REUS W/ TWL LRG LVL3 (GOWN DISPOSABLE) ×3 IMPLANT
GOWN STRL REUS W/TWL LRG LVL3 (GOWN DISPOSABLE) ×9
KIT BASIN OR (CUSTOM PROCEDURE TRAY) ×3 IMPLANT
KIT TURNOVER KIT B (KITS) ×3 IMPLANT
NS IRRIG 1000ML POUR BTL (IV SOLUTION) ×3 IMPLANT
PACK GENERAL/GYN (CUSTOM PROCEDURE TRAY) ×3 IMPLANT
PAD ARMBOARD 7.5X6 YLW CONV (MISCELLANEOUS) ×6 IMPLANT
SPONGE LAP 18X18 RF (DISPOSABLE) ×2 IMPLANT
SUT ETHILON 3 0 PS 1 (SUTURE) ×5 IMPLANT
SUT VIC AB 3-0 SH 27 (SUTURE) ×6
SUT VIC AB 3-0 SH 27X BRD (SUTURE) IMPLANT
TOWEL GREEN STERILE (TOWEL DISPOSABLE) ×6 IMPLANT
TOWEL GREEN STERILE FF (TOWEL DISPOSABLE) ×3 IMPLANT
UNDERPAD 30X30 (UNDERPADS AND DIAPERS) ×3 IMPLANT
WATER STERILE IRR 1000ML POUR (IV SOLUTION) ×3 IMPLANT

## 2018-09-06 NOTE — Interval H&P Note (Signed)
History and Physical Interval Note:  09/06/2018 8:36 AM  [name]  has presented today for surgery, with the diagnosis of NONVIABLE TISSUE LEFT  FOOT  The various methods of treatment have been discussed with the patient and family. After consideration of risks, benefits and other options for treatment, the patient has consented to  Procedure(s): TRANSMETATARSAL AMPUTATION (Left) as a surgical intervention .  The patient's history has been reviewed, patient examined, no change in status, stable for surgery.  I have reviewed the patient's chart and labs.  Questions were answered to the patient's satisfaction.     [name]

## 2018-09-06 NOTE — Progress Notes (Signed)
PT Cancellation Note  Patient Details Name: [name] MRN: [id number] DOB: 20-Aug-1946   Cancelled Treatment:    Reason Eval/Treat Not Completed: Medical issues which prohibited therapy. Diastolic BP's are 21'H to 40's and will try again at another time.   Ramond Dial 09/06/2018, 3:39 PM   Mee Hives, PT MS Acute Rehab Dept. Number: [place] and [place]

## 2018-09-06 NOTE — Anesthesia Postprocedure Evaluation (Signed)
Anesthesia Post Note  Patient: [name]  Procedure(s) Performed: TRANSMETATARSAL AMPUTATION (Left Foot)     Patient location during evaluation: PACU Anesthesia Type: General Level of consciousness: awake and alert Pain management: pain level controlled Vital Signs Assessment: post-procedure vital signs reviewed and stable Respiratory status: spontaneous breathing, nonlabored ventilation, respiratory function stable and patient connected to nasal cannula oxygen Cardiovascular status: blood pressure returned to baseline and stable Postop Assessment: no apparent nausea or vomiting Anesthetic complications: no    Last Vitals:  Vitals:   09/06/18 1230 09/06/18 1235  BP:  (!) 159/37  Pulse:  67  Resp: 18 18  Temp:    SpO2: 100% 100%    Last Pain:  Vitals:   09/06/18 1320  PainSc: 10-Worst pain ever                 Weslynn Ke

## 2018-09-06 NOTE — Transfer of Care (Signed)
Immediate Anesthesia Transfer of Care Note  Patient: [name]  Procedure(s) Performed: TRANSMETATARSAL AMPUTATION (Left Foot)  Patient Location: PACU  Anesthesia Type:General  Level of Consciousness: awake, alert  and oriented  Airway & Oxygen Therapy: Patient Spontanous Breathing and Patient connected to nasal cannula oxygen  Post-op Assessment: Report given to RN and Post -op Vital signs reviewed and stable  Post vital signs: Reviewed and stable  Last Vitals:  Vitals Value Taken Time  BP 181/38 09/06/2018 10:35 AM  Temp 36.4 C 09/06/2018 10:36 AM  Pulse 70 09/06/2018 10:38 AM  Resp 11 09/06/2018 10:38 AM  SpO2 100 % 09/06/2018 10:38 AM  Vitals shown include unvalidated device data.  Last Pain:  Vitals:   09/06/18 0748  PainSc: 7       Patients Stated Pain Goal: 3 (22/97/98 9211)  Complications: No apparent anesthesia complications

## 2018-09-06 NOTE — Anesthesia Preprocedure Evaluation (Signed)
Anesthesia Evaluation  Patient identified by MRN, date of birth, ID band Patient awake    Reviewed: Allergy & Precautions, NPO status , Patient's Chart, lab work & pertinent test results, reviewed documented beta blocker date and time   History of Anesthesia Complications Negative for: history of anesthetic complications  Airway Mallampati: II  TM Distance: >3 FB Neck ROM: Full    Dental  (+) Edentulous Upper, Edentulous Lower   Pulmonary COPD (last inhaler needed a few weeks ago),  COPD inhaler, former smoker,    breath sounds clear to auscultation       Cardiovascular hypertension, Pt. on medications and Pt. on home beta blockers (-) angina+ Peripheral Vascular Disease and + DVT (2015)  + dysrhythmias Atrial Fibrillation  Rhythm:Irregular Rate:Normal  2/19 ECHO: EF 55-60%, mod AI, mod MR, mod TR   Neuro/Psych negative neurological ROS     GI/Hepatic Neg liver ROS, GERD  Medicated and Controlled,  Endo/Other  diabetes, Insulin Dependent  Renal/GU ESRF and DialysisRenal disease (K+ 3.0, [facility] dialysis)     Musculoskeletal   Abdominal   Peds  Hematology  (+) Blood dyscrasia (Hb 8.3), anemia , eliquis   Anesthesia Other Findings   Reproductive/Obstetrics                             Anesthesia Physical  Anesthesia Plan  ASA: III  Anesthesia Plan: General   Post-op Pain Management:    Induction: Intravenous  PONV Risk Score and Plan: 4 or greater and Ondansetron, Dexamethasone, Treatment may vary due to age or medical condition and Diphenhydramine  Airway Management Planned: Oral ETT and LMA  Additional Equipment:   Intra-op Plan:   Post-operative Plan: Extubation in OR  Informed Consent: I have reviewed the patients History and Physical, chart, labs and discussed the procedure including the risks, benefits and alternatives for the proposed anesthesia with the patient or  authorized representative who has indicated his/her understanding and acceptance.   Dental advisory given  Plan Discussed with: CRNA, Surgeon and Anesthesiologist  Anesthesia Plan Comments: (Plan routine monitors, GETA)        Anesthesia Quick Evaluation

## 2018-09-06 NOTE — Consult Note (Signed)
[facility] Renal Consultation Note    Indication for Consultation:  Management of ESRD/hemodialysis, anemia, hypertension/volume, and secondary hyperparathyroidism. PCP:  HPI: [name] is a 72 y.o. female with ESRD, A-fib (on Eliquis), HTN, Type 2 DM, and PAD (s/p R AKA 05/2018) who was admitted for planned L TMA.  Known severe PAD, vascular surgery has exhausted all options to revascularize the legs. 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 Hx R AKA few months ago, now with interim dry gangrene of L toes. S/p L transmetatarsal amputation this morning, went well. Denies any CP, dyspnea, fever, chills, nausea, vomiting, abdominal pain.   Dialyzes at [facility] on TTS schedule. Last was 12/7 via [facility]. No recent dialysis issues.  Past Medical History:  Diagnosis Date  . Anemia   . Arthritis   . Asthma   . Atrial fibrillation ([place])   . Atrial flutter ([place])    a. s/p TEE/DCCV December 2013 b. 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 Bone spur of toe    Right 5th toe  . Chronic diastolic CHF (congestive heart failure) (HCC)    a. EF 50-55% by echo in 2015  . Colon polyposis   . COPD (chronic obstructive pulmonary disease) ([place])   . DVT of upper extremity (deep vein thrombosis) ([place])    Right basilic vein, June 3875  . ESRD (end stage renal disease) on dialysis ([place])   . Essential hypertension, benign   . Fractures   . Glaucoma   . Lupus ([place])   . Pericarditis   . SLE (systemic lupus erythematosus) ([place])   . Type 2 diabetes mellitus ([place])   . UTI (lower urinary tract infection)   . Wound of right leg 06/2017   Past Surgical History:  Procedure Laterality Date  . ABDOMINAL AORTOGRAM W/LOWER EXTREMITY N/A 12/18/2017   Procedure: ABDOMINAL AORTOGRAM W/LOWER EXTREMITY;   Surgeon: Elam Dutch, MD;  Location: [facility];  Service: Cardiovascular;  Laterality: N/A;  bilateral  . ABDOMINAL HYSTERECTOMY  1983  . AMPUTATION Right 06/08/2018   Procedure: RIGHT ABOVE KNEE AMPUTATION;  Surgeon: Elam Dutch, MD;  Location: [place];  Service: Vascular;  Laterality: Right;  . ANKLE SURGERY  1993  . AV FISTULA PLACEMENT Left 03/27/2014   Procedure: ARTERIOVENOUS (AV) FISTULA CREATION;  Surgeon: Rosetta Posner, MD;  Location: [place];  Service: Vascular;  Laterality: Left;  . BACK SURGERY  1980  . BASCILIC VEIN TRANSPOSITION Left 01/13/2018   Procedure: LEFT BASILIC VEIN TRANSPOSITION SECOND STAGE;  Surgeon: Elam Dutch, MD;  Location: [place];  Service: Vascular;  Laterality: Left;  . CARDIOVASCULAR STRESS TEST  12/19/2009   no stress induced rhythm abnormalities, ekg negative for ischemia  . CARDIOVERSION  09/16/2012   Procedure: CARDIOVERSION;  Surgeon: Sanda Klein, MD;  Location: [facility];  Service: Cardiovascular;  Laterality: N/A;  . CATARACT EXTRACTION    . COLONOSCOPY  09/20/2012   Dr. [name]: multiple tubular adenomas   . COLONOSCOPY  2005   Dr. [name]. Polyps, path unknown   . COLONOSCOPY N/A 07/24/2016   Dr. [name]: 3 significant size polyps removed from the colon, tubular adenomas.  Next colonoscopy in October 2020.  Marland Kitchen ESOPHAGOGASTRODUODENOSCOPY  09/20/2012   Dr. [name]: duodenal erosion and possible resolving ulcer at the angularis   . ESOPHAGOGASTRODUODENOSCOPY N/A  03/24/2018   Dr. [name]: Mild erosive reflux esophagitis, erosive gastropathy and enteropathy fairly extensive, no H. pylori on biopsy.  Marland Kitchen EYE SURGERY    . IR FLUORO GUIDE CV LINE RIGHT  11/09/2017  . IR RADIOLOGIST EVAL & MGMT  09/01/2018  . IR US GUIDE VASC ACCESS RIGHT  11/09/2017  . REVISON OF ARTERIOVENOUS FISTULA Left 03/15/2018   Procedure: REVISION OF Left arm BASILIC VEIN TRANSPOSITION;  Surgeon: Angelia Mould, MD;  Location: [place];  Service: Vascular;   Laterality: Left;  . TEE WITHOUT CARDIOVERSION  09/16/2012   Procedure: TRANSESOPHAGEAL ECHOCARDIOGRAM (TEE);  Surgeon: Sanda Klein, MD;  Location: [facility];  Service: Cardiovascular;  Laterality: N/A;  . TRANSESOPHAGEAL ECHOCARDIOGRAM WITH CARDIOVERSION  09/16/2012   EF 60-65%, moderate LVH, moderate regurg of the aortic valve, LA moderately dilated   Family History  Problem Relation Age of Onset  . Diabetes Brother   . Diabetes Father   . Hypertension Father   . Hypertension Sister   . Stroke Mother   . Diabetes Sister   . Hypertension Brother   . Colon cancer Neg Hx    Social History:  reports that she quit smoking about 6 years ago. Her smoking use included cigarettes. She has a 30.00 pack-year smoking history. She has never used smokeless tobacco. She reports that she does not drink alcohol or use drugs.  ROS: As per HPI otherwise negative.  Physical Exam: Vitals:   09/06/18 1405 09/06/18 1415 09/06/18 1419 09/06/18 1436  BP: (!) 163/39   (!) 149/37  Pulse:    64  Resp: 20 18    Temp:   (!) 97.5 F (36.4 C) 97.7 F (36.5 C)  TempSrc:    Oral  SpO2:    100%     General: Frail woman, NAD. Looks older than stated age. Head: Normocephalic, atraumatic, sclera non-icteric, mucus membranes are moist. Neck: Supple without lymphadenopathy/masses. JVD not elevated. Lungs: Clear bilaterally to auscultation without wheezes, rales, or rhonchi. Breathing is unlabored. Heart: RRR with normal S1, S2. No murmurs, rubs, or gallops appreciated. Abdomen: Soft, mildly tender in lower abdomen without guarding. Musculoskeletal:  Strength and tone appear normal for age. Lower extremities: R AKA stump healed without edema. L foot bandaged; hyperpigmentation/vascular changes to LLE without edema Neuro: Alert and oriented X 3. Moves all extremities spontaneously. Psych:  Responds to questions appropriately with a normal affect. Dialysis Access: TDC in R chest, no tenderness.  Allergies   Allergen Reactions  . Ace Inhibitors Cough   Prior to Admission medications   Medication Sig Start Date End Date Taking? Authorizing Provider  acetaminophen (TYLENOL) 325 MG tablet Take 1-2 tablets (325-650 mg total) by mouth every 4 (four) hours as needed for mild pain. Patient taking differently: Take 1,000 mg by mouth daily as needed for mild pain or moderate pain.  06/24/18  Yes Love, Ivan Anchors, PA-C  albuterol (PROVENTIL HFA;VENTOLIN HFA) 108 (90 BASE) MCG/ACT inhaler Inhale 2 puffs into the lungs every 6 (six) hours as needed for shortness of breath.    Yes [provider]  allopurinol (ZYLOPRIM) 100 MG tablet Take 100 mg by mouth every Monday, Wednesday, and Friday.    Yes [provider]  Amino Acids-Protein Hydrolys (FEEDING SUPPLEMENT, PRO-STAT SUGAR FREE 64,) LIQD Take 30 mLs by mouth 2 (two) times daily. 12/29/17  Yes Danford, Suann Larry, MD  amiodarone (PACERONE) 200 MG tablet Take 1 tablet (200 mg total) by mouth daily. 08/04/18  Yes Strader, Fransisco Hertz, PA-C  apixaban (ELIQUIS) 5 MG TABS tablet Take 1 tablet (5 mg total) by mouth 2 (two) times daily. 12/29/17  Yes Danford, Suann Larry, MD  atorvastatin (LIPITOR) 20 MG tablet Take 20 mg by mouth at bedtime.    Yes [provider]  B Complex-C-Folic Acid (DIALYVITE 161) 0.8 MG TABS Take 1 tablet by mouth daily.   Yes [provider]  brimonidine-timolol (COMBIGAN) 0.2-0.5 % ophthalmic solution Place 1 drop into the left eye 2 (two) times daily.    Yes [provider]  calcium acetate (PHOSLO) 667 MG capsule Take 667 mg by mouth 3 (three) times daily with meals.  07/01/18  Yes [provider]  Control Gel Formula Dressing (DUODERM CGF BORDER DRESSING EX) Place 1 patch onto the skin daily. Applied to left hip   Yes [provider]  fentaNYL (DURAGESIC - DOSED MCG/HR) 50 MCG/HR Place 25 mcg onto the skin every 3 (three) days.    Yes [provider]  folic acid  (FOLVITE) 1 MG tablet Take 1 mg by mouth daily.   Yes [provider]  hydroxychloroquine (PLAQUENIL) 200 MG tablet Take 200 mg by mouth daily.    Yes [provider]  Insulin Detemir (LEVEMIR FLEXTOUCH) 100 UNIT/ML Pen Inject 4 Units into the skin every morning. Patient taking differently: Inject 10 Units into the skin daily with breakfast.  07/30/18  Yes Emokpae, Courage, MD  insulin lispro (HUMALOG KWIKPEN) 100 UNIT/ML KiwkPen Inject 5 Units into the skin 3 (three) times daily with meals as needed (*Only take if blood sugar levels are over 100).    Yes [provider]  metoprolol tartrate (LOPRESSOR) 25 MG tablet Take 1/2 tablet twice a day on Mondays, Wednesdays and Fridays Patient taking differently: Take 12.5 mg by mouth See admin instructions. Take 12.5 mg twice a day on Mondays, Wednesdays and Fridays 06/24/18  Yes Love, Ivan Anchors, PA-C  Oxycodone HCl 10 MG TABS Take 10 mg by mouth every 6 (six) hours as needed (pain.).    Yes [provider]  pantoprazole (PROTONIX) 40 MG tablet Take 1 tablet (40 mg total) by mouth daily. 03/29/18  Yes Rourk, [name], MD  predniSONE (DELTASONE) 5 MG tablet Take 5 mg by mouth daily with breakfast.   Yes [provider]  travoprost, benzalkonium, (TRAVATAN) 0.004 % ophthalmic solution Place 1 drop into both eyes at bedtime.    Yes [provider]  UNIFINE PENTIPS 31G X 6 MM MISC USE AS DIRECTED AT BEDTIME WITH LEVEMIR AND WITH SLIDING SCALE INSULIN. 09/03/18   Cassandria Anger, MD   Current Facility-Administered Medications  Medication Dose Route Frequency Provider Last Rate Last Dose  . 0.9 %  sodium chloride infusion  250 mL Intravenous PRN Laurence Slate M, PA-C      . acetaminophen (TYLENOL) tablet 325-650 mg  325-650 mg Oral Q4H PRN Laurence Slate M, PA-C       Or  . acetaminophen (TYLENOL) suppository 325-650 mg  325-650 mg Rectal Q4H PRN Laurence Slate M, PA-C      . albuterol (PROVENTIL) (2.5  MG/3ML) 0.083% nebulizer solution 2.5 mg  2.5 mg Inhalation Q6H PRN Laurence Slate M, PA-C      . allopurinol (ZYLOPRIM) tablet 100 mg  100 mg Oral Q M,W,F Laurence Slate M, PA-C   100 mg at 09/06/18 1513  . [START ON 09/07/2018] amiodarone (PACERONE) tablet 200 mg  200 mg Oral Daily Laurence Slate M, PA-C      . [  START ON 09/07/2018] apixaban (ELIQUIS) tablet 5 mg  5 mg Oral BID Laurence Slate M, PA-C      . atorvastatin (LIPITOR) tablet 20 mg  20 mg Oral QHS Collins, Emma M, PA-C      . bisacodyl (DULCOLAX) EC tablet 5 mg  5 mg Oral Daily PRN Laurence Slate M, PA-C      . brimonidine-timolol (COMBIGAN) 0.2-0.5 % ophthalmic solution 1 drop  1 drop Left Eye BID Laurence Slate M, PA-C      . calcium acetate (PHOSLO) capsule 667 mg  667 mg Oral TID WC Collins, Emma M, PA-C      . ceFAZolin (ANCEF) 2-4 GM/100ML-% IVPB           . [START ON 09/07/2018] ceFAZolin (ANCEF) IVPB 2g/100 mL premix  2 g Intravenous Q24H Collins, Emma M, PA-C      . [START ON 09/07/2018] docusate sodium (COLACE) capsule 100 mg  100 mg Oral Daily Collins, Emma M, PA-C      . feeding supplement (PRO-STAT SUGAR FREE 64) liquid 30 mL  30 mL Oral BID Laurence Slate M, PA-C      . fentaNYL (DURAGESIC - dosed mcg/hr) patch 25 mcg  25 mcg Transdermal Q72H Laurence Slate M, PA-C   25 mcg at 09/06/18 1513  . fentaNYL (SUBLIMAZE) 100 MCG/2ML injection           . fentaNYL (SUBLIMAZE) 100 MCG/2ML injection           . folic acid (FOLVITE) tablet 1 mg  1 mg Oral Daily Laurence Slate M, PA-C   1 mg at 09/06/18 1513  . guaiFENesin-dextromethorphan (ROBITUSSIN DM) 100-10 MG/5ML syrup 15 mL  15 mL Oral Q4H PRN Laurence Slate M, PA-C      . hydrALAZINE (APRESOLINE) injection 5 mg  5 mg Intravenous Q20 Min PRN Laurence Slate M, [place]      . [START ON 09/07/2018] hydroxychloroquine (PLAQUENIL) tablet 200 mg  200 mg Oral Daily Theda Sers, Emma M, PA-C      . insulin aspart (novoLOG) injection 0-15 Units  0-15 Units Subcutaneous TID WC Collins, Emma M, PA-C       . insulin detemir (LEVEMIR) injection 5 Units  5 Units Subcutaneous QHS Collins, Emma M, PA-C      . labetalol (NORMODYNE,TRANDATE) injection 10 mg  10 mg Intravenous Q10 min PRN Laurence Slate M, PA-C      . lidocaine (LIDODERM) 5 % 1 patch  1 patch Transdermal Q24H Angelia Mould, MD      . metoprolol tartrate (LOPRESSOR) injection 2-5 mg  2-5 mg Intravenous Q2H PRN Laurence Slate M, PA-C      . metoprolol tartrate (LOPRESSOR) tablet 12.5 mg  12.5 mg Oral 2 times per day on Mon Wed Fri Theda Sers, Emma M, PA-C      . morphine 2 MG/ML injection 1-2 mg  1-2 mg Intravenous Q2H PRN Laurence Slate M, PA-C   2 mg at 09/06/18 1513  . multivitamin with minerals tablet 1 tablet  1 tablet Oral Daily Ulyses Amor, PA-C   1 tablet at 09/06/18 1512  . ondansetron (ZOFRAN) injection 4 mg  4 mg Intravenous Q6H PRN Laurence Slate M, PA-C      . oxyCODONE (Oxy IR/ROXICODONE) 5 MG immediate release tablet           . oxyCODONE (Oxy IR/ROXICODONE) immediate release tablet 5-10 mg  5-10 mg Oral Q4H PRN Laurence Slate M, PA-C   5 mg at 09/06/18  1131  . [START ON 09/07/2018] pantoprazole (PROTONIX) EC tablet 40 mg  40 mg Oral Daily Collins, Emma M, PA-C      . phenol (CHLORASEPTIC) mouth spray 1 spray  1 spray Mouth/Throat PRN Laurence Slate M, PA-C      . [START ON 09/07/2018] predniSONE (DELTASONE) tablet 5 mg  5 mg Oral Q breakfast Theda Sers, Emma M, PA-C      . senna-docusate (Senokot-S) tablet 1 tablet  1 tablet Oral QHS PRN Laurence Slate M, PA-C      . sodium chloride flush (NS) 0.9 % injection 3 mL  3 mL Intravenous Q12H Collins, Emma M, PA-C      . sodium chloride flush (NS) 0.9 % injection 3 mL  3 mL Intravenous PRN Ulyses Amor, PA-C      . Travoprost (BAK Free) (TRAVATAN) 0.004 % ophthalmic solution SOLN 1 drop  1 drop Both Eyes QHS Ulyses Amor, [place]       Labs: Basic Metabolic Panel: Recent Labs  Lab 09/06/18 0732  NA 138  K 3.6  CL 98  CO2 26  GLUCOSE 80  BUN 54*  CREATININE 4.58*   CALCIUM 8.2*   CBC: Recent Labs  Lab 09/06/18 0732  WBC 7.0  HGB 9.0*  HCT 30.6*  MCV 93.9  PLT 230   CBG: Recent Labs  Lab 09/06/18 0711 09/06/18 1038 09/06/18 1345 09/06/18 1348  GLUCAP 63* 64* 52* 71   Dialysis Orders: TTS at [facility] 3:30hr, 400/800, EDW 62.5kg, 2K/2.5ca, TDC, heparin 2000 - Mircera 276mcg IV q 2 weeks (last 11/25)  Assessment/Plan: 1.  L toe dry gangrene (s/p L TMA 09/06/18): Severe PAD. Per vascular surgery. 2.  ESRD: Continue HD per usual TTS schedule, next 12/10. Will hold heparin. 3.  Hypertension/volume: BP slightly high, no edema on exam. Low UF goal. 4.  Anemia: Hgb 9. Due for ESA, will dose tomorrow. 5.  Metabolic bone disease: Ca ok, Phos pending. Continue home meds. 6.  Nutrition: Will start protein supplements for wound healing. 7.  Type 2 DM  Veneta Penton, PA-C 09/06/2018, 3:14 PM  [facility] Pager: [phone]

## 2018-09-06 NOTE — Progress Notes (Signed)
Pt has pressure injury on left buttocks when arriving to floor. Family states MD told them to use duoderm and change weekly. Family stated it was due to be changed, this RN took duoderm off and applied foam dressing, MD made aware and WOC RN consult in.

## 2018-09-06 NOTE — Op Note (Signed)
    NAME: [name]    MRN: [id number] DOB: 02-05-1946    DATE OF OPERATION: 09/06/2018  PREOP DIAGNOSIS:    Gangrene of the left foot  POSTOP DIAGNOSIS:    Same  PROCEDURE:    Left transmetatarsal amputation  SURGEON: Judeth Cornfield. Scot Dock, MD, FACS  ASSIST: None  ANESTHESIA: General  EBL: Minimal  INDICATIONS:    [name] is a 72 y.o. female who presented with a gangrenous wound of her left foot.  She underwent an arteriogram which showed that she had no options for revascularization.  She did have a patent posterior tibial vessel down to the ankle which was then occluded.  We offered the option of primary above-the-knee amputation versus attempting a transmetatarsal amputation with only a 50% chance of healing.  She elected to proceed with attempted transmetatarsal amputation.  FINDINGS:   There was marginal bleeding at the wound.  TECHNIQUE:   The patient was taken to the operating room and received a general anesthetic.  The left foot was prepped and draped in usual sterile fashion.  A fishmouth incision was made encompassing the toes on the left foot and the dissection carried down to the metatarsal heads.  The periosteum was elevated and the bone divided using a CD4 saw.  I then dissected the posterior flap away from the bones and the forefoot was removed.  Hemostasis was obtained using electrocautery.  The wound was then irrigated with copious amounts of saline.  The tendons were retracted into the wound and divided.  The deep layer was closed with interrupted 3-0 Vicryl's.  The skin was closed with 3 oh nylons.  A sterile dressing was applied.  The patient tolerated the procedure well and was transferred to the recovery room in stable condition.  All needle and sponge counts were correct.  [name], MD, FACS [facility]  DATE OF DICTATION:   09/06/2018

## 2018-09-06 NOTE — Anesthesia Procedure Notes (Signed)
Procedure Name: LMA Insertion Date/Time: 09/06/2018 9:25 AM Performed by: Marsa Aris, CRNA Pre-anesthesia Checklist: Patient identified, Emergency Drugs available, Suction available and Patient being monitored Patient Re-evaluated:Patient Re-evaluated prior to induction Oxygen Delivery Method: Circle System Utilized Preoxygenation: Pre-oxygenation with 100% oxygen Induction Type: IV induction Ventilation: Mask ventilation without difficulty LMA: LMA inserted LMA Size: 4.0 Number of attempts: 1 Airway Equipment and Method: Bite block Placement Confirmation: positive ETCO2 Tube secured with: Tape Dental Injury: Teeth and Oropharynx as per pre-operative assessment

## 2018-09-07 ENCOUNTER — Other Ambulatory Visit: Payer: Self-pay

## 2018-09-07 ENCOUNTER — Encounter (HOSPITAL_COMMUNITY): Payer: Self-pay | Admitting: Vascular Surgery

## 2018-09-07 ENCOUNTER — Encounter: Payer: Self-pay | Admitting: Vascular Surgery

## 2018-09-07 DIAGNOSIS — Z89611 Acquired absence of right leg above knee: Secondary | ICD-10-CM

## 2018-09-07 DIAGNOSIS — G8918 Other acute postprocedural pain: Secondary | ICD-10-CM

## 2018-09-07 DIAGNOSIS — Z992 Dependence on renal dialysis: Secondary | ICD-10-CM

## 2018-09-07 DIAGNOSIS — I480 Paroxysmal atrial fibrillation: Secondary | ICD-10-CM

## 2018-09-07 DIAGNOSIS — I739 Peripheral vascular disease, unspecified: Secondary | ICD-10-CM

## 2018-09-07 DIAGNOSIS — I951 Orthostatic hypotension: Secondary | ICD-10-CM

## 2018-09-07 DIAGNOSIS — D638 Anemia in other chronic diseases classified elsewhere: Secondary | ICD-10-CM

## 2018-09-07 DIAGNOSIS — N186 End stage renal disease: Secondary | ICD-10-CM

## 2018-09-07 DIAGNOSIS — Z89432 Acquired absence of left foot: Secondary | ICD-10-CM

## 2018-09-07 DIAGNOSIS — D62 Acute posthemorrhagic anemia: Secondary | ICD-10-CM

## 2018-09-07 DIAGNOSIS — E119 Type 2 diabetes mellitus without complications: Secondary | ICD-10-CM

## 2018-09-07 LAB — CBC
HCT: 29.2 % — ABNORMAL LOW (ref 36.0–46.0)
Hemoglobin: 8.5 g/dL — ABNORMAL LOW (ref 12.0–15.0)
MCH: 27.2 pg (ref 26.0–34.0)
MCHC: 29.1 g/dL — ABNORMAL LOW (ref 30.0–36.0)
MCV: 93.3 fL (ref 80.0–100.0)
NRBC: 0 % (ref 0.0–0.2)
Platelets: 262 10*3/uL (ref 150–400)
RBC: 3.13 MIL/uL — ABNORMAL LOW (ref 3.87–5.11)
RDW: 18.2 % — ABNORMAL HIGH (ref 11.5–15.5)
WBC: 9.8 10*3/uL (ref 4.0–10.5)

## 2018-09-07 LAB — BASIC METABOLIC PANEL
Anion gap: 14 (ref 5–15)
BUN: 63 mg/dL — ABNORMAL HIGH (ref 8–23)
CO2: 22 mmol/L (ref 22–32)
Calcium: 8 mg/dL — ABNORMAL LOW (ref 8.9–10.3)
Chloride: 98 mmol/L (ref 98–111)
Creatinine, Ser: 5.07 mg/dL — ABNORMAL HIGH (ref 0.44–1.00)
GFR calc Af Amer: 9 mL/min — ABNORMAL LOW (ref >60)
GFR calc non Af Amer: 8 mL/min — ABNORMAL LOW (ref >60)
Glucose, Bld: 139 mg/dL — ABNORMAL HIGH (ref 70–99)
Potassium: 4.3 mmol/L (ref 3.5–5.1)
Sodium: 134 mmol/L — ABNORMAL LOW (ref 135–145)

## 2018-09-07 LAB — GLUCOSE, CAPILLARY
GLUCOSE-CAPILLARY: 81 mg/dL (ref 70–99)
Glucose-Capillary: 124 mg/dL — ABNORMAL HIGH (ref 70–99)
Glucose-Capillary: 172 mg/dL — ABNORMAL HIGH (ref 70–99)
Glucose-Capillary: 86 mg/dL (ref 70–99)

## 2018-09-07 MED ORDER — SODIUM CHLORIDE 0.9 % IV SOLN: 100.0000 mL | INTRAVENOUS | Status: DC | PRN

## 2018-09-07 MED ORDER — MORPHINE SULFATE (PF) 2 MG/ML IV SOLN
INTRAVENOUS | Status: AC
  Filled 2018-09-07: qty 1

## 2018-09-07 MED ORDER — HEPARIN SODIUM (PORCINE) 1000 UNIT/ML IJ SOLN
INTRAMUSCULAR | Status: AC
  Administered 2018-09-07: 1000 [IU]
  Filled 2018-09-07: qty 4

## 2018-09-07 MED ORDER — LIDOCAINE HCL (PF) 1 % IJ SOLN: 5.0000 mL | INTRAMUSCULAR | Status: DC | PRN

## 2018-09-07 MED ORDER — LIDOCAINE-PRILOCAINE 2.5-2.5 % EX CREA: 1.0000 "application " | TOPICAL_CREAM | CUTANEOUS | Status: DC | PRN

## 2018-09-07 MED ORDER — DARBEPOETIN ALFA 200 MCG/0.4ML IJ SOSY
PREFILLED_SYRINGE | INTRAMUSCULAR | Status: AC
  Administered 2018-09-07: 200 ug via INTRAVENOUS
  Filled 2018-09-07: qty 0.4

## 2018-09-07 MED ORDER — PENTAFLUOROPROP-TETRAFLUOROETH EX AERO: 1.0000 "application " | INHALATION_SPRAY | CUTANEOUS | Status: DC | PRN

## 2018-09-07 MED ORDER — NEPRO/CARBSTEADY PO LIQD
237.0000 mL | Freq: Two times a day (BID) | ORAL | Status: DC
  Administered 2018-09-08 – 2018-09-13 (×12): 237 mL via ORAL
  Filled 2018-09-07 (×12): qty 237

## 2018-09-07 MED ORDER — MORPHINE SULFATE (PF) 2 MG/ML IV SOLN
INTRAVENOUS | Status: AC
  Administered 2018-09-07: 2 mg via INTRAVENOUS
  Filled 2018-09-07: qty 1

## 2018-09-07 NOTE — Evaluation (Signed)
Occupational Therapy Evaluation Patient Details Name: [name] MRN: [id number] DOB: April 28, 1946 Today's Date: 09/07/2018    History of Present Illness 72 y.o. female who underwent a right above-the-knee amputation on 06/08/2018. Presents with dry gangrene of the left forefoot with tibial artery occlusive disease. S/p L transmetatarsal amputation. PMH includes diabetes, end-stage renal disease, and tibial artery occlusive disease with prior arteriogram.     Clinical Impression   PTA, pt was living with her husband who assisted with functional transfers and ADLs; Niece comes to assist with sponge bathing. Pt currently requiring Max A for LB ADLs and Mod-Max A +2 for functional transfers. Pt motivated to participate and family very supportive. Pt would benefit from further acute OT to facilitate safe dc. Recommend dc to CIR for intensive OT to optimize safety, increase independence with ADLs, and decrease caregiver burden.    Follow Up Recommendations  CIR;Supervision/Assistance - 24 hour    Equipment Recommendations  Other (comment)(Defer to next venue)    Recommendations for Other Services Rehab consult;PT consult     Precautions / Restrictions Precautions Precautions: Fall Required Braces or Orthoses: Other Brace(Darco shoe for weightbearing through heel) Restrictions Weight Bearing Restrictions: Yes LUE Weight Bearing: Partial weight bearing Other Position/Activity Restrictions: weightbearing through heel only      Mobility Bed Mobility Overal bed mobility: Needs Assistance Bed Mobility: Sit to Supine       Sit to supine: Mod assist;+2 for physical assistance;Max assist   General bed mobility comments: Mod A to initate hip movements once in bed. Max A to position.   Transfers Overall transfer level: Needs assistance Equipment used: 2 person hand held assist Transfers: Comptroller transfers: Mod assist;+2 physical  assistance;Max assist   General transfer comment: Mod A +2 for lateral leaning to bring hips onto EOB. Then Max A +2 for repositioning hips with bed pad    Balance Overall balance assessment: Needs assistance Sitting-balance support: No upper extremity supported;Feet supported Sitting balance-Leahy Scale: Good                                     ADL either performed or assessed with clinical judgement   ADL Overall ADL's : Needs assistance/impaired Eating/Feeding: Supervision/ safety;Set up;Sitting   Grooming: Supervision/safety;Set up;Sitting;Wash/dry face   Upper Body Bathing: Set up;Supervision/ safety;Sitting   Lower Body Bathing: Maximal assistance;Sitting/lateral leans;Bed level   Upper Body Dressing : Set up;Supervision/safety;Sitting   Lower Body Dressing: Maximal assistance;Bed level;Sitting/lateral leans Lower Body Dressing Details (indicate cue type and reason): doffing depends Toilet Transfer: Moderate assistance;+2 for physical assistance(simulated at recliner<>bed) Toilet Transfer Details (indicate cue type and reason): Anterior/posterior transfer at recliner and bed. Mod A +2 for transitioning hips from recliner. Once at bed, Max A to manage hips with bed pad         Functional mobility during ADLs: Moderate assistance;Maximal assistance;+2 for physical assistance(anterior/posterior transfer) General ADL Comments: Pt with decreased activity tolerance due to pain. Family very supportive     Museum/gallery curator      Pertinent Vitals/Pain Pain Assessment: Faces Faces Pain Scale: Hurts whole lot Pain Location: L foot surgical site Pain Descriptors / Indicators: Grimacing;Guarding;Throbbing;Sharp;Shooting;Sore Pain Intervention(s): Monitored during session;Limited activity within patient's tolerance;Repositioned;Relaxation     Hand Dominance Right   Extremity/Trunk Assessment Upper Extremity Assessment Upper Extremity  Assessment: Overall WFL for tasks assessed   Lower Extremity Assessment Lower Extremity Assessment: Defer to PT evaluation;RLE deficits/detail;LLE deficits/detail RLE Deficits / Details: R AKA LLE Deficits / Details: s/p L transmetatarsal amputation, ankle ROM limited by pain, knee and hip ROM WFL, hip and knee strength grossly 4/5 LLE: Unable to fully assess due to pain LLE Sensation: WNL       Communication Communication Communication: No difficulties   Cognition Arousal/Alertness: Awake/alert Behavior During Therapy: Flat affect Overall Cognitive Status: Within Functional Limits for tasks assessed                                     General Comments  Family in room throughout session - son, daughter-in-law, and husband    Exercises Exercises: General Lower Extremity General Exercises - Lower Extremity Heel Slides: AROM;Left;10 reps;Seated   Shoulder Instructions      Home Living Family/patient expects to be discharged to:: Private residence Living Arrangements: Spouse/significant other Available Help at Discharge: Family;Available 24 hours/day Type of Home: House Home Access: Ramped entrance     Home Layout: Two level Alternate Level Stairs-Number of Steps: 6- stair lift Alternate Level Stairs-Rails: Left Bathroom Shower/Tub: Occupational psychologist: Standard Bathroom Accessibility: No   Home Equipment: Cane - single point;Walker - 2 wheels;Wheelchair - manual;Shower seat;Bedside commode          Prior Functioning/Environment Level of Independence: Needs assistance  Gait / Transfers Assistance Needed: non ambulatory, assisted transfers to wheelchair, BSC using sliding board with at least 1 person assist, sometimes 2  ADL's / Homemaking Assistance Needed: assisted by family, patient use depends for urination, assisted to [facility] for bowel movements using sliding board. Niece comes to assist with sponge bathing.            OT Problem  List: Decreased strength;Decreased range of motion;Decreased activity tolerance;Impaired balance (sitting and/or standing);Decreased knowledge of use of DME or AE;Decreased knowledge of precautions;Pain      OT Treatment/Interventions: Self-care/ADL training;Therapeutic exercise;Energy conservation;DME and/or AE instruction;Therapeutic activities;Patient/family education    OT Goals(Current goals can be found in the care plan section) Acute Rehab OT Goals Patient Stated Goal: have less pain OT Goal Formulation: With patient/family Time For Goal Achievement: 09/21/18 Potential to Achieve Goals: Good ADL Goals Pt Will Perform Lower Body Dressing: with set-up;with supervision;sitting/lateral leans;with caregiver independent in assisting;with adaptive equipment Pt Will Transfer to Toilet: bedside commode;with min guard assist;with transfer board Pt Will Perform Toileting - Clothing Manipulation and hygiene: with min guard assist;sit to/from stand Additional ADL Goal #1: Pt will perform bed mobility with supervision in preparation for ADLs  OT Frequency: Min 2X/week   Barriers to D/C:            Co-evaluation              AM-PAC OT "6 Clicks" Daily Activity     Outcome Measure Help from another person eating meals?: A Little Help from another person taking care of personal grooming?: A Little Help from another person toileting, which includes using toliet, bedpan, or urinal?: A Lot Help from another person bathing (including washing, rinsing, drying)?: A Lot Help from another person to put on and taking off regular upper body clothing?: A Little Help from another person to put on and taking off regular lower body clothing?: A Lot 6 Click Score: 15   End of Session Nurse Communication: Mobility status  Activity Tolerance: Patient tolerated treatment well;Patient limited by pain Patient left: in bed;with call bell/phone within reach;with nursing/sitter in room;with family/visitor  present  OT Visit Diagnosis: Unsteadiness on feet (R26.81);Other abnormalities of gait and mobility (R26.89);Muscle weakness (generalized) (M62.81);Pain Pain - Right/Left: Left Pain - part of body: Ankle and joints of foot                Time: 1207-1226 OT Time Calculation (min): 19 min Charges:  OT General Charges $OT Visit: 1 Visit OT Evaluation $OT Eval Moderate Complexity: [place], OTR/L Acute Rehab Pager: [phone] Office: [place] 09/07/2018, 2:27 PM

## 2018-09-07 NOTE — Evaluation (Signed)
Physical Therapy Evaluation Patient Details Name: [name] MRN: [id number] DOB: [birth year] Today's Date: 09/07/2018   History of Present Illness  72 y.o. female who underwent a right above-the-knee amputation on 06/08/2018. Presents with dry gangrene of the left forefoot with tibial artery occlusive disease. S/p L transmetatarsal amputation. PMH includes diabetes, end-stage renal disease, and tibial artery occlusive disease with prior arteriogram.    Clinical Impression  PTA pt living with husband in split level home with chair lift to bedroom/bathroom, however pt has been sleeping on main level in recliner using BSC because her wheelchair does not fit in the bathroom upstairs. Pt utilizes sliding board for transfers from recliner to [place] with assist of her husband. Niece comes everyday to give her sponge baths. Pt currently limited in safe mobility by L foot pain and deconditioning. Pt currently requires min-modA for bed mobility and modAx2 for A-P transfer to recliner. PT concurs with physicians recommendation for CIR level rehab at d/c. PT will continue to follow acutely.     Follow Up Recommendations CIR    Equipment Recommendations  Other (comment)(TBD at next venue)    Recommendations for Other Services Rehab consult     Precautions / Restrictions Precautions Precautions: Fall Required Braces or Orthoses: Other Brace(Darco shoe for weightbearing through heel) Restrictions Weight Bearing Restrictions: Yes LUE Weight Bearing: Partial weight bearing Other Position/Activity Restrictions: weightbearing through heel only      Mobility  Bed Mobility Overal bed mobility: Needs Assistance Bed Mobility: Supine to Sit     Supine to sit: Min assist;Mod assist     General bed mobility comments: min A to bring trunk into upright, modA for scooting hips to EoB and managing L LE due to increased pain in dependent position   Transfers Overall transfer level: Needs  assistance Equipment used: 2 person hand held assist Transfers: Comptroller transfers: Mod assist;+2 physical assistance   General transfer comment: heavy modAx2, to scoot backwards from bed to recliner  Ambulation/Gait             General Gait Details: unable to attempt         Balance Overall balance assessment: Modified Independent(in seated )                                           Pertinent Vitals/Pain Pain Assessment: 0-10 Pain Score: 7  Pain Location: L foot surgical site Pain Descriptors / Indicators: Grimacing;Guarding;Throbbing;Sharp;Shooting;Sore Pain Intervention(s): Limited activity within patient's tolerance;Monitored during session;Premedicated before session;Repositioned    Home Living Family/patient expects to be discharged to:: Private residence Living Arrangements: Spouse/significant other Available Help at Discharge: Family;Available 24 hours/day Type of Home: House Home Access: Ramped entrance     Home Layout: Two level Home Equipment: Cane - single point;Walker - 2 wheels;Wheelchair - manual;Shower seat;Bedside commode      Prior Function Level of Independence: Needs assistance   Gait / Transfers Assistance Needed: non ambulatory, assisted transfers to wheelchair, BSC using sliding board with at least 1 person assist, sometimes 2   ADL's / Homemaking Assistance Needed: assisted by family, patient use depends for urination, assisted to [facility] for bowel movements using sliding board        Hand Dominance   Dominant Hand: Right    Extremity/Trunk Assessment   Upper Extremity Assessment Upper Extremity Assessment: Overall WFL for tasks  assessed    Lower Extremity Assessment Lower Extremity Assessment: LLE deficits/detail;RLE deficits/detail RLE Deficits / Details: R AKA LLE Deficits / Details: s/p L transmetatarsal amputation, ankle ROM limited by pain, knee and hip ROM WFL,  hip and knee strength grossly 4/5 LLE: Unable to fully assess due to pain LLE Sensation: WNL       Communication   Communication: No difficulties  Cognition Arousal/Alertness: Awake/alert Behavior During Therapy: Flat affect Overall Cognitive Status: Within Functional Limits for tasks assessed                                        General Comments General comments (skin integrity, edema, etc.): Pt family in room during session. Pt BP prior to mobilization 147/56. VSS Darco shoe not in room. RN notified of need for shoe to perform transfers with PWB through L LE. Foam dressing over L hip pressure injury dry and intact.         Assessment/Plan    PT Assessment Patient needs continued PT services  PT Problem List Decreased strength;Decreased activity tolerance;Decreased balance;Decreased mobility;Pain       PT Treatment Interventions DME instruction;Functional mobility training;Therapeutic activities;Therapeutic exercise;Balance training;Wheelchair mobility training;Patient/family education    PT Goals (Current goals can be found in the Care Plan section)  Acute Rehab PT Goals Patient Stated Goal: have less pain PT Goal Formulation: With patient/family Time For Goal Achievement: 09/21/18 Potential to Achieve Goals: Good    Frequency Min 3X/week    AM-PAC PT "6 Clicks" Mobility  Outcome Measure Help needed turning from your back to your side while in a flat bed without using bedrails?: A Little Help needed moving from lying on your back to sitting on the side of a flat bed without using bedrails?: A Lot Help needed moving to and from a bed to a chair (including a wheelchair)?: A Lot Help needed standing up from a chair using your arms (e.g., wheelchair or bedside chair)?: Total Help needed to walk in hospital room?: Total Help needed climbing 3-5 steps with a railing? : Total 6 Click Score: 10    End of Session   Activity Tolerance: Patient tolerated  treatment well;Patient limited by pain;No increased pain Patient left: in chair;with chair alarm set;with family/visitor present;with call bell/phone within reach Nurse Communication: Mobility status;Patient requests pain meds;Weight bearing status;Precautions PT Visit Diagnosis: Unsteadiness on feet (R26.81);Other abnormalities of gait and mobility (R26.89);Muscle weakness (generalized) (M62.81);Difficulty in walking, not elsewhere classified (R26.2);Pain Pain - Right/Left: Left Pain - part of body: Ankle and joints of foot    Time: 0915-1000 PT Time Calculation (min) (ACUTE ONLY): 45 min   Charges:   PT Evaluation $PT Eval Moderate Complexity: 1 Mod PT Treatments $Therapeutic Activity: 23-37 mins        Churchill Grimsley B. Migdalia Dk PT, DPT Acute Rehabilitation Services Pager [phone] Office [phone]   [place] 09/07/2018, 11:22 AM

## 2018-09-07 NOTE — Progress Notes (Signed)
   VASCULAR SURGERY ASSESSMENT & PLAN:   1 Day Post-Op s/p: Left transmetatarsal amputation.  Her dressing is dry.  Her dressing changes will begin today.  The nurses are doing a good job of floating her left heel.  She has a right above-the-knee amputation and will need physical therapy before going home.  She will have to learn how to use a Darco shoe on the left foot (partial weightbearing left foot heel only)  RENAL: Hemodialysis Tuesday Thursday Saturday.  SUBJECTIVE:   No complaints this morning.  PHYSICAL EXAM:   Vitals:   09/06/18 1436 09/06/18 1923 09/07/18 0009 09/07/18 0503  BP: (!) 149/37 (!) 200/48 (!) 182/51 (!) 149/48  Pulse: 64 80 76 79  Resp:  14 14 14   Temp: 97.7 F (36.5 C) 98.4 F (36.9 C) 99.9 F (37.7 C) 99.7 F (37.6 C)  TempSrc: Oral Oral Oral Oral  SpO2: 100% 100% 91% 97%   The dressing on her transmetatarsal amputation site is dry.  LABS:   Lab Results  Component Value Date   WBC 9.8 09/07/2018   HGB 8.5 (L) 09/07/2018   HCT 29.2 (L) 09/07/2018   MCV 93.3 09/07/2018   PLT 262 09/07/2018   Lab Results  Component Value Date   CREATININE 5.07 (H) 09/07/2018   Lab Results  Component Value Date   INR 1.18 06/08/2018   CBG (last 3)  Recent Labs    09/06/18 2304 09/06/18 2327 09/07/18 0643  GLUCAP 68* 89 124*    PROBLEM LIST:    Active Problems:   PAD (peripheral artery disease) (HCC)   CURRENT MEDS:   . allopurinol  100 mg Oral Q M,W,F  . amiodarone  200 mg Oral Daily  . apixaban  5 mg Oral BID  . atorvastatin  20 mg Oral QHS  . brimonidine  1 drop Left Eye BID   And  . timolol  1 drop Left Eye BID  . calcium acetate  667 mg Oral TID WC  . Chlorhexidine Gluconate Cloth  6 each Topical Q0600  . darbepoetin (ARANESP) injection - DIALYSIS  200 mcg Intravenous Q Tue-HD  . docusate sodium  100 mg Oral Daily  . feeding supplement (PRO-STAT SUGAR FREE 64)  30 mL Oral BID  . fentaNYL  25 mcg Transdermal Q72H  . folic acid  1  mg Oral Daily  . hydroxychloroquine  200 mg Oral Daily  . insulin aspart  0-15 Units Subcutaneous TID WC  . insulin detemir  5 Units Subcutaneous QHS  . metoprolol tartrate  12.5 mg Oral 2 times per day on Mon Wed Fri  . multivitamin with minerals  1 tablet Oral Daily  . pantoprazole  40 mg Oral Daily  . predniSONE  5 mg Oral Q breakfast  . sodium chloride flush  3 mL Intravenous Q12H  . Travoprost (BAK Free)  1 drop Both Eyes QHS    [name] Beeper: [phone] Office: [phone] 09/07/2018

## 2018-09-07 NOTE — Consult Note (Signed)
Physical Medicine and Rehabilitation Consult Reason for Consult:  Decreased functional mobility Referring Physician: Dr. Scot Dock   HPI: [name] is a 72 y.o.right handed female with history of glaucoma, type 2 diabetes mellitus with peripheral neuropathy, COPD, DVT, atrial fibrillation/A flutter maintained on amiodarone as well as Eliquis, chronic diastolic congestive heart failure, end-stage renal disease with hemodialysis as well as right AKA 06/08/2018 per Dr. Oneida Alar. History taken from chart review and patient. Patient received inpatient rehabilitation services 06/10/2018 to 06/24/2018 and was discharged to home with home health therapies. Per chart review patient lives with spouse. 2 level home with ramped entrance. Patient primarily used a wheelchair at home. Husband local family with good support. Presented 09/06/2018 with gangrenous left foot. Arteriogram showed no options for revascularization. Underwent left transmetatarsal amputation 09/06/2018 per Dr. [name]. Hospital course pain management as well as orthostatic hypotension blood pressures 30s to 40s. Acute blood loss anemia 8.5. Hemodialysis ongoing as per renal services. Chronic Eliquis has been resumed. Physical occupational therapy evaluations pending. M.D. Has requested physical medicine rehabilitation consult.  Review of Systems  Constitutional: Negative for chills and fever.  HENT: Negative for hearing loss.   Eyes:       Vision impaired from glaucoma  Respiratory: Negative for cough and shortness of breath.   Cardiovascular: Positive for leg swelling.  Gastrointestinal: Positive for constipation. Negative for nausea and vomiting.  Genitourinary: Negative for flank pain.  Musculoskeletal: Positive for joint pain and myalgias.  Skin: Negative for rash.  All other systems reviewed and are negative.  Past Medical History:  Diagnosis Date  . Anemia   . Arthritis   . Asthma   . Atrial  fibrillation ([place])   . Atrial flutter ([place])    a. s/p TEE/DCCV December 2013 b. 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 Bone spur of toe    Right 5th toe  . Chronic diastolic CHF (congestive heart failure) (H[facility])    a. EF 50-55% by echo in 2015  . Colon polyposis   . COPD (chronic obstructive pulmonary disease) ([place])   . DVT of upper extremity (deep vein thrombosis) ([place])    Right basilic vein, June 7169  . ESRD (end stage renal disease) on dialysis ([place])   . Essential hypertension, benign   . Fractures   . Glaucoma   . Lupus ([place])   . Pericarditis   . SLE (systemic lupus erythematosus) ([place])   . Type 2 diabetes mellitus ([place])   . UTI (lower urinary tract infection)   . Wound of right leg 06/2017   Past Surgical History:  Procedure Laterality Date  . ABDOMINAL AORTOGRAM W/LOWER EXTREMITY N/A 12/18/2017   Procedure: ABDOMINAL AORTOGRAM W/LOWER EXTREMITY;  Surgeon: Elam Dutch, MD;  Location: [facility];  Service: Cardiovascular;  Laterality: N/A;  bilateral  . ABDOMINAL HYSTERECTOMY  1983  . AMPUTATION Right 06/08/2018   Procedure: RIGHT ABOVE KNEE AMPUTATION;  Surgeon: Elam Dutch, MD;  Location: [facility];  Service: Vascular;  Laterality: Right;  . ANKLE SURGERY  1993  . AV FISTULA PLACEMENT Left 03/27/2014   Procedure: ARTERIOVENOUS (AV) FISTULA CREATION;  Surgeon: Rosetta Posner, MD;  Location: [facility];  Service: Vascular;  Laterality: Left;  . BACK SURGERY  1980  . BASCILIC VEIN TRANSPOSITION Left 01/13/2018   Procedure: LEFT  BASILIC VEIN TRANSPOSITION SECOND STAGE;  Surgeon: Elam Dutch, MD;  Location: [place];  Service: Vascular;  Laterality: Left;  . CARDIOVASCULAR STRESS TEST  12/19/2009   no stress induced rhythm abnormalities, ekg negative for ischemia  . CARDIOVERSION  09/16/2012    Procedure: CARDIOVERSION;  Surgeon: Sanda Klein, MD;  [facility];  Service: Cardiovascular;  Laterality: N/A;  . CATARACT EXTRACTION    . COLONOSCOPY  09/20/2012   Dr. [name]: multiple tubular adenomas   . COLONOSCOPY  2005   Dr. [name]. Polyps, path unknown   . COLONOSCOPY N/A 07/24/2016   Dr. [name]: 3 significant size polyps removed from the colon, tubular adenomas.  Next colonoscopy in October 2020.  Marland Kitchen ESOPHAGOGASTRODUODENOSCOPY  09/20/2012   Dr. [name]: duodenal erosion and possible resolving ulcer at the angularis   . ESOPHAGOGASTRODUODENOSCOPY N/A 03/24/2018   Dr. [name]: Mild erosive reflux esophagitis, erosive gastropathy and enteropathy fairly extensive, no H. pylori on biopsy.  Marland Kitchen EYE SURGERY    . IR FLUORO GUIDE CV LINE RIGHT  11/09/2017  . IR RADIOLOGIST EVAL & MGMT  09/01/2018  . IR US GUIDE VASC ACCESS RIGHT  11/09/2017  . REVISON OF ARTERIOVENOUS FISTULA Left 03/15/2018   Procedure: REVISION OF Left arm BASILIC VEIN TRANSPOSITION;  Surgeon: Angelia Mould, MD;  Location: [place];  Service: Vascular;  Laterality: Left;  . TEE WITHOUT CARDIOVERSION  09/16/2012   Procedure: TRANSESOPHAGEAL ECHOCARDIOGRAM (TEE);  Surgeon: Sanda Klein, MD;  Location: [facility];  Service: Cardiovascular;  Laterality: N/A;  . TRANSESOPHAGEAL ECHOCARDIOGRAM WITH CARDIOVERSION  09/16/2012   EF 60-65%, moderate LVH, moderate regurg of the aortic valve, LA moderately dilated   Family History  Problem Relation Age of Onset  . Diabetes Brother   . Diabetes Father   . Hypertension Father   . Hypertension Sister   . Stroke Mother   . Diabetes Sister   . Hypertension Brother   . Colon cancer Neg Hx    Social History:  reports that she quit smoking about 6 years ago. Her smoking use included cigarettes. She has a 30.00 pack-year smoking history. She has never used smokeless tobacco. She reports that she does not drink alcohol or use drugs. Allergies:  Allergies  Allergen  Reactions  . Ace Inhibitors Cough   Medications Prior to Admission  Medication Sig Dispense Refill  . acetaminophen (TYLENOL) 325 MG tablet Take 1-2 tablets (325-650 mg total) by mouth every 4 (four) hours as needed for mild pain. (Patient taking differently: Take 1,000 mg by mouth daily as needed for mild pain or moderate pain. )    . albuterol (PROVENTIL HFA;VENTOLIN HFA) 108 (90 BASE) MCG/ACT inhaler Inhale 2 puffs into the lungs every 6 (six) hours as needed for shortness of breath.     . allopurinol (ZYLOPRIM) 100 MG tablet Take 100 mg by mouth every Monday, Wednesday, and Friday.     . Amino Acids-Protein Hydrolys (FEEDING SUPPLEMENT, PRO-STAT SUGAR FREE 64,) LIQD Take 30 mLs by mouth 2 (two) times daily. 900 mL 0  . amiodarone (PACERONE) 200 MG tablet Take 1 tablet (200 mg total) by mouth daily. 90 tablet 3  . apixaban (ELIQUIS) 5 MG TABS tablet Take 1 tablet (5 mg total) by mouth 2 (two) times daily. 60 tablet 6  . atorvastatin (LIPITOR) 20 MG tablet Take 20 mg by mouth at bedtime.     . B Complex-C-Folic Acid (DIALYVITE 160) 0.8 MG TABS Take 1 tablet by mouth daily.    . brimonidine-timolol (  COMBIGAN) 0.2-0.5 % ophthalmic solution Place 1 drop into the left eye 2 (two) times daily.     . calcium acetate (PHOSLO) 667 MG capsule Take 667 mg by mouth 3 (three) times daily with meals.   4  . Control Gel Formula Dressing (DUODERM CGF BORDER DRESSING EX) Place 1 patch onto the skin daily. Applied to left hip    . fentaNYL (DURAGESIC - DOSED MCG/HR) 50 MCG/HR Place 25 mcg onto the skin every 3 (three) days.     . folic acid (FOLVITE) 1 MG tablet Take 1 mg by mouth daily.    . hydroxychloroquine (PLAQUENIL) 200 MG tablet Take 200 mg by mouth daily.     . Insulin Detemir (LEVEMIR FLEXTOUCH) 100 UNIT/ML Pen Inject 4 Units into the skin every morning. (Patient taking differently: Inject 10 Units into the skin daily with breakfast. ) 15 mL 11  . insulin lispro (HUMALOG KWIKPEN) 100 UNIT/ML KiwkPen  Inject 5 Units into the skin 3 (three) times daily with meals as needed (*Only take if blood sugar levels are over 100).     . metoprolol tartrate (LOPRESSOR) 25 MG tablet Take 1/2 tablet twice a day on Mondays, Wednesdays and Fridays (Patient taking differently: Take 12.5 mg by mouth See admin instructions. Take 12.5 mg twice a day on Mondays, Wednesdays and Fridays) 30 tablet 0  . Oxycodone HCl 10 MG TABS Take 10 mg by mouth every 6 (six) hours as needed (pain.).     Marland Kitchen pantoprazole (PROTONIX) 40 MG tablet Take 1 tablet (40 mg total) by mouth daily. 30 tablet 11  . predniSONE (DELTASONE) 5 MG tablet Take 5 mg by mouth daily with breakfast.    . travoprost, benzalkonium, (TRAVATAN) 0.004 % ophthalmic solution Place 1 drop into both eyes at bedtime.     Marland Kitchen UNIFINE PENTIPS 31G X 6 MM MISC USE AS DIRECTED AT BEDTIME WITH LEVEMIR AND WITH SLIDING SCALE INSULIN. 100 each 0    Home:    Functional History:   Functional Status:  Mobility:          ADL:    Cognition: Cognition Orientation Level: Oriented X4    Blood pressure (!) 149/48, pulse 79, temperature 99.7 F (37.6 C), temperature source Oral, resp. rate 14, SpO2 97 %. Physical Exam  Vitals reviewed. Constitutional: She is oriented to person, place, and time. She appears well-developed and well-nourished.  HENT:  Head: Normocephalic and atraumatic.  Eyes: Right eye exhibits no discharge. Left eye exhibits no discharge.  Pupil sluggish but reactive to light  Neck: Normal range of motion. Neck supple. No thyromegaly present.  Cardiovascular: Normal rate and regular rhythm.  Respiratory: Effort normal and breath sounds normal. No respiratory distress.  GI: Soft. Bowel sounds are normal. She exhibits no distension.  Musculoskeletal:  No edema or tenderness in extremities  Neurological: She is alert and oriented to person, place, and time.  Motor: RUE: 5/5 proximal to distal RLE: HF 5/5 LUE: 4/5 proximal to distal LLE: HF, KE  3/5, ankle dressed  Skin:  Left transmetatarsal amputation site is dressed appropriately tender.  Right AKA site well-healed  Psychiatric: Her affect is blunt. Her speech is delayed. She is slowed.    Results for orders placed or performed during the hospital encounter of 09/06/18 (from the past 24 hour(s))  Glucose, capillary     Status: Abnormal   Collection Time: 09/06/18  7:11 AM  Result Value Ref Range   Glucose-Capillary 63 (L) 70 - 99 mg/dL  Basic metabolic panel     Status: Abnormal   Collection Time: 09/06/18  7:32 AM  Result Value Ref Range   Sodium 138 135 - 145 mmol/L   Potassium 3.6 3.5 - 5.1 mmol/L   Chloride 98 98 - 111 mmol/L   CO2 26 22 - 32 mmol/L   Glucose, Bld 80 70 - 99 mg/dL   BUN 54 (H) 8 - 23 mg/dL   Creatinine, Ser 4.58 (H) 0.44 - 1.00 mg/dL   Calcium 8.2 (L) 8.9 - 10.3 mg/dL   GFR calc non Af Amer 9 (L) >60 mL/min   GFR calc Af Amer 10 (L) >60 mL/min   Anion gap 14 5 - 15  CBC     Status: Abnormal   Collection Time: 09/06/18  7:32 AM  Result Value Ref Range   WBC 7.0 4.0 - 10.5 K/uL   RBC 3.26 (L) 3.87 - 5.11 MIL/uL   Hemoglobin 9.0 (L) 12.0 - 15.0 g/dL   HCT 30.6 (L) 36.0 - 46.0 %   MCV 93.9 80.0 - 100.0 fL   MCH 27.6 26.0 - 34.0 pg   MCHC 29.4 (L) 30.0 - 36.0 g/dL   RDW 18.4 (H) 11.5 - 15.5 %   Platelets 230 150 - 400 K/uL   nRBC 0.0 0.0 - 0.2 %  Hemoglobin A1c     Status: Abnormal   Collection Time: 09/06/18  7:32 AM  Result Value Ref Range   Hgb A1c MFr Bld 4.5 (L) 4.8 - 5.6 %   Mean Plasma Glucose 82.45 mg/dL  Glucose, capillary     Status: Abnormal   Collection Time: 09/06/18 10:38 AM  Result Value Ref Range   Glucose-Capillary 64 (L) 70 - 99 mg/dL  Glucose, capillary     Status: Abnormal   Collection Time: 09/06/18  1:45 PM  Result Value Ref Range   Glucose-Capillary 52 (L) 70 - 99 mg/dL  Glucose, capillary     Status: None   Collection Time: 09/06/18  1:48 PM  Result Value Ref Range   Glucose-Capillary 71 70 - 99 mg/dL    Glucose, capillary     Status: None   Collection Time: 09/06/18  4:50 PM  Result Value Ref Range   Glucose-Capillary 77 70 - 99 mg/dL  Glucose, capillary     Status: Abnormal   Collection Time: 09/06/18 10:03 PM  Result Value Ref Range   Glucose-Capillary 54 (L) 70 - 99 mg/dL  Glucose, capillary     Status: Abnormal   Collection Time: 09/06/18 11:04 PM  Result Value Ref Range   Glucose-Capillary 68 (L) 70 - 99 mg/dL  Glucose, capillary     Status: None   Collection Time: 09/06/18 11:27 PM  Result Value Ref Range   Glucose-Capillary 89 70 - 99 mg/dL  Basic metabolic panel     Status: Abnormal   Collection Time: 09/07/18  2:51 AM  Result Value Ref Range   Sodium 134 (L) 135 - 145 mmol/L   Potassium 4.3 3.5 - 5.1 mmol/L   Chloride 98 98 - 111 mmol/L   CO2 22 22 - 32 mmol/L   Glucose, Bld 139 (H) 70 - 99 mg/dL   BUN 63 (H) 8 - 23 mg/dL   Creatinine, Ser 5.07 (H) 0.44 - 1.00 mg/dL   Calcium 8.0 (L) 8.9 - 10.3 mg/dL   GFR calc non Af Amer 8 (L) >60 mL/min   GFR calc Af Amer 9 (L) >60 mL/min   Anion gap 14 5 -  15  CBC     Status: Abnormal   Collection Time: 09/07/18  2:51 AM  Result Value Ref Range   WBC 9.8 4.0 - 10.5 K/uL   RBC 3.13 (L) 3.87 - 5.11 MIL/uL   Hemoglobin 8.5 (L) 12.0 - 15.0 g/dL   HCT 29.2 (L) 36.0 - 46.0 %   MCV 93.3 80.0 - 100.0 fL   MCH 27.2 26.0 - 34.0 pg   MCHC 29.1 (L) 30.0 - 36.0 g/dL   RDW 18.2 (H) 11.5 - 15.5 %   Platelets 262 150 - 400 K/uL   nRBC 0.0 0.0 - 0.2 %   No results found.  Assessment/Plan: Diagnosis: left transmet amputations with history of right AKA Labs independently reviewed.  Records reviewed and summated above.  1. Does the need for close, 24 hr/day medical supervision in concert with the patient's rehab needs make it unreasonable for this patient to be served in a less intensive setting? Potentially  2. Co-Morbidities requiring supervision/potential complications: Acute blood loss anemia on chronic anemia (repeat labs,  transfuse to ensure appropriate perfusion for increased activity tolerance), post-op pain management (Biofeedback training with therapies to help reduce reliance on opiates pain, particularly IV morphine, monitor pain control during therapies, and sedation at rest and titrate to maximum efficacy to ensure participation and gains in therapies), orthostatic hypotension, glaucoma, type 2 diabetes mellitus with peripheral neuropathy (Monitor in accordance with exercise and adjust meds as necessary), COPD, DVT, atrial fibrillation/A flutter, chronic diastolic congestive heart failure, end-stage renal disease (recs per Nephro) 3. Due to safety, skin/wound care, disease management, pain management and patient education, does the patient require 24 hr/day rehab nursing? Yes 4. Does the patient require coordinated care of a physician, rehab nurse, PT (1-2 hrs/day, 5 days/week) and OT (1-2 hrs/day, 5 days/week) to address physical and functional deficits in the context of the above medical diagnosis(es)? Potentially Addressing deficits in the following areas: balance, endurance, locomotion, strength, transferring, bathing, dressing, toileting and psychosocial support 5. Can the patient actively participate in an intensive therapy program of at least 3 hrs of therapy per day at least 5 days per week? Potentially 6. The potential for patient to make measurable gains while on inpatient rehab is TBD 7. Anticipated functional outcomes upon discharge from inpatient rehab are TBD  with PT, TBD with OT, n/a with SLP. 8. Estimated rehab length of stay to reach the above functional goals is: TBD. 9. Anticipated D/C setting: Home 10. Anticipated post D/C treatments: HH therapy and Home excercise program 11. Overall Rehab/Functional Prognosis: good  RECOMMENDATIONS: This patient's condition is appropriate for continued rehabilitative care in the following setting: Will await therapy evaluations.  If patient at/near baseline  level of functioning (wheelchair for the most part) recommend home with [place] vs SNF if husband cannot provide support.  If deficits signifcantly increased, recommend CIR. Patient has agreed to participate in recommended program. Yes Note that insurance prior authorization may be required for reimbursement for recommended care.  Comment: Rehab Admissions Coordinator to follow up.   I have personally performed a face to face diagnostic evaluation, including, but not limited to relevant history and physical exam findings, of this patient and developed relevant assessment and plan.  Additionally, I have reviewed and concur with the physician assistant's documentation above.   Delice Lesch, MD, ABPMR Lavon Paganini Angiulli, PA-C 09/07/2018

## 2018-09-07 NOTE — Progress Notes (Signed)
  [name] Progress Note   Subjective:  Seen in room. Foot pain rated 7 of 10 today. Denies CP/dyspnea. For HD later today.  Objective Vitals:   09/06/18 1436 09/06/18 1923 09/07/18 0009 09/07/18 0503  BP: (!) 149/37 (!) 200/48 (!) 182/51 (!) 149/48  Pulse: 64 80 76 79  Resp:  14 14 14   Temp: 97.7 F (36.5 C) 98.4 F (36.9 C) 99.9 F (37.7 C) 99.7 F (37.6 C)  TempSrc: Oral Oral Oral Oral  SpO2: 100% 100% 91% 97%   Physical Exam General: Well appearing, NAD. Mild R periorbital edema. Heart: RRR; no murmur Lungs: CTAB Abdomen: soft, non-tender Extremities: R AKA, L foot bandaged; no edema Dialysis Access: TDC in R chest  Additional Objective Labs: Basic Metabolic Panel: Recent Labs  Lab 09/06/18 0732 09/07/18 0251  NA 138 134*  K 3.6 4.3  CL 98 98  CO2 26 22  GLUCOSE 80 139*  BUN 54* 63*  CREATININE 4.58* 5.07*  CALCIUM 8.2* 8.0*   CBC: Recent Labs  Lab 09/06/18 0732 09/07/18 0251  WBC 7.0 9.8  HGB 9.0* 8.5*  HCT 30.6* 29.2*  MCV 93.9 93.3  PLT 230 262   CBG: Recent Labs  Lab 09/06/18 1650 09/06/18 2203 09/06/18 2304 09/06/18 2327 09/07/18 0643  GLUCAP 77 54* 68* 89 124*   Medications: . sodium chloride     . allopurinol  100 mg Oral Q M,W,F  . amiodarone  200 mg Oral Daily  . apixaban  5 mg Oral BID  . atorvastatin  20 mg Oral QHS  . brimonidine  1 drop Left Eye BID   And  . timolol  1 drop Left Eye BID  . calcium acetate  667 mg Oral TID WC  . Chlorhexidine Gluconate Cloth  6 each Topical Q0600  . darbepoetin (ARANESP) injection - DIALYSIS  200 mcg Intravenous Q Tue-HD  . docusate sodium  100 mg Oral Daily  . feeding supplement (PRO-STAT SUGAR FREE 64)  30 mL Oral BID  . fentaNYL  25 mcg Transdermal Q72H  . folic acid  1 mg Oral Daily  . hydroxychloroquine  200 mg Oral Daily  . insulin aspart  0-15 Units Subcutaneous TID WC  . insulin detemir  5 Units Subcutaneous QHS  . metoprolol tartrate  12.5 mg Oral 2 times  per day on Mon Wed Fri  . multivitamin with minerals  1 tablet Oral Daily  . pantoprazole  40 mg Oral Daily  . predniSONE  5 mg Oral Q breakfast  . sodium chloride flush  3 mL Intravenous Q12H  . Travoprost (BAK Free)  1 drop Both Eyes QHS    Dialysis Orders: TTS at [facility] 3:30hr, 400/800, EDW 62.5kg, 2K/2.5ca, TDC, heparin 2000 - Mircera 278mcg IV q 2 weeks (last 11/25)  Assessment/Plan: 1.  L toe dry gangrene (s/p L TMA 09/06/18): Severe PAD. Per vascular surgery. 2.  ESRD: Continue HD per usual TTS schedule, next today. Will hold heparin. 3.  Hypertension/volume: BP slightly high, no edema on exam. Low UF goal. 4.  Anemia: Hgb 8.5. Due for ESA, giving today. 5.  Metabolic bone disease: Ca ok, Phos pending. 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 Continue home meds for now. 6.  Nutrition: Will start protein supplements for wound healing. 7.  Type 2 DM 8.  A-fib (on Eliquis)  Veneta Penton, PA-C 09/07/2018, 9:43 AM  [facility] Pager: [phone]

## 2018-09-07 NOTE — Progress Notes (Signed)
Blood sugar 54 this evening and asymptomatic.  Held nightly insulin, multiple snacks given.  Blood sugar up to 89.  Patient denies complaints.

## 2018-09-07 NOTE — Plan of Care (Signed)

## 2018-09-07 NOTE — Progress Notes (Signed)
Inpatient Rehabilitation Admissions Coordinator  I met with patient in dialysis. Pt was previously at inpt rehab/CIR 9/19 after AKA and d/c'd home. I contacted her spouse by phone. He would like to pursue readmit to CIR if possible before return home. States they were using a sideboard to transfer into the wheelchair. I will begin insurance authorization with [facility] Medicare for a possible inpt rehab admit pending insurance approval.  Danne Baxter, RN, MSN Rehab Admissions Coordinator [phone] 09/07/2018 3:40 PM

## 2018-09-07 NOTE — Progress Notes (Signed)
Inpatient Diabetes Program Recommendations  AACE/ADA: New Consensus Statement on Inpatient Glycemic Control (2015)  Target Ranges:  Prepandial:   less than 140 mg/dL      Peak postprandial:   less than 180 mg/dL (1-2 hours)      Critically ill patients:  140 - 180 mg/dL   Lab Results  Component Value Date   GLUCAP 124 (H) 09/07/2018   HGBA1C 4.5 (L) 09/06/2018    Review of Glycemic Control Results for [name], [name] (MRN [id number]) as of 09/07/2018 10:30  Ref. Range 09/06/2018 16:50 09/06/2018 22:03 09/06/2018 23:04 09/06/2018 23:27 09/07/2018 06:43  Glucose-Capillary Latest Ref Range: 70 - 99 mg/dL 77 54 (L) 68 (L) 89 124 (H)   Diabetes history: DM Outpatient Diabetes medications: Levemir 10 units daily + Humalog 5 units tid Current orders for Inpatient glycemic control: Levemir 5 units + Novolog moderate correction scale tid + hs  Inpatient Diabetes Program Recommendations:   A1c 4.5 and hypoglycemia on admission. -Decrease Novolog correction scale to sensitive Review CBGs to determine if Levemir is needed.  Thank you, Nani Gasser. Lennyn Bellanca, RN, MSN, CDE  Diabetes Coordinator Inpatient Glycemic Control Team Team Pager [phone] (8am-5pm) 09/07/2018 10:35 AM

## 2018-09-07 NOTE — Consult Note (Signed)
[place] Nurse wound consult note Patient receiving care in [facility] 5N05.  No family present at the time of my assessment. Reason for Consult: Wound care recommendations Wound type: Stage 2 PI to left ischium Pressure Injury POA: Yes Measurement: 2.6 cm x 2.0 cm x no measurable depth Wound TMY:TRZN Drainage (amount, consistency, odor) small amount of serousanginous on existing foam dressing Periwound: Fragile, peeling Dressing procedure/placement/frequency:Cleanse the wound on the left ischium with saline. Pat dry. Apply one pad of skin barrier around the wound.  Allow this to air dry, then apply a thin hydrocolloid Kellie Simmering [phone]).  Date the dressing.  These can remain in place 5 days.  Monitor the wound area(s) for worsening of condition such as: Signs/symptoms of infection,  Increase in size,  Development of or worsening of odor, Development of pain, or increased pain at the affected locations.  Notify the medical team if any of these develop.  Thank you for the consult.  Discussed plan of care with the patient and bedside nurse.  [place] nurse will not follow at this time.  Please re-consult the [place] team if needed.  Val Riles, RN, MSN, CWOCN, CNS-BC, pager [phone]

## 2018-09-08 ENCOUNTER — Ambulatory Visit: Payer: Medicare Other | Admitting: Physical Medicine & Rehabilitation

## 2018-09-08 LAB — MRSA PCR SCREENING: MRSA by PCR: NEGATIVE

## 2018-09-08 LAB — GLUCOSE, CAPILLARY
Glucose-Capillary: 77 mg/dL (ref 70–99)
Glucose-Capillary: 162 mg/dL — ABNORMAL HIGH (ref 70–99)
Glucose-Capillary: 159 mg/dL — ABNORMAL HIGH (ref 70–99)
Glucose-Capillary: 87 mg/dL (ref 70–99)

## 2018-09-08 LAB — BASIC METABOLIC PANEL
Anion gap: 11 (ref 5–15)
BUN: 23 mg/dL (ref 8–23)
CALCIUM: 8 mg/dL — AB (ref 8.9–10.3)
CO2: 26 mmol/L (ref 22–32)
CREATININE: 2.68 mg/dL — AB (ref 0.44–1.00)
Chloride: 97 mmol/L — ABNORMAL LOW (ref 98–111)
GFR calc non Af Amer: 17 mL/min — ABNORMAL LOW (ref >60)
GFR, EST AFRICAN AMERICAN: 20 mL/min — AB (ref >60)
Glucose, Bld: 92 mg/dL (ref 70–99)
POTASSIUM: 3.1 mmol/L — AB (ref 3.5–5.1)
Sodium: 134 mmol/L — ABNORMAL LOW (ref 135–145)

## 2018-09-08 LAB — CBC
HCT: 26.1 % — ABNORMAL LOW (ref 36.0–46.0)
Hemoglobin: 7.6 g/dL — ABNORMAL LOW (ref 12.0–15.0)
MCH: 26.9 pg (ref 26.0–34.0)
MCHC: 29.1 g/dL — ABNORMAL LOW (ref 30.0–36.0)
MCV: 92.2 fL (ref 80.0–100.0)
Platelets: 227 10*3/uL (ref 150–400)
RBC: 2.83 MIL/uL — ABNORMAL LOW (ref 3.87–5.11)
RDW: 17.6 % — ABNORMAL HIGH (ref 11.5–15.5)
WBC: 10.5 10*3/uL (ref 4.0–10.5)
nRBC: 0 % (ref 0.0–0.2)

## 2018-09-08 NOTE — Discharge Instructions (Addendum)
DRESSING CHANGES INSTRUCTIONS:  Soak the left transmetatarsal amputation site daily in lukewarm dial soap soaks or hospital equivalent. The left transmetatarsal amputation site needs to be packed twice daily with wet-to-dry 2 x 2's (normal saline). The foot should then be wrapped with Kerlix and a 4 inch Ace. These orders have previously been written but it does not appear that this is being done. Thanks Please pack the open part of the transmetatarsal amputation by packing a moist 2 x 2 into the open area using the back of a Q-tip. Thank you.      Information on my medicine - ELIQUIS (apixaban)  This medication education was reviewed with me or my healthcare representative as part of my discharge preparation.  Why was Eliquis prescribed for you? Eliquis was prescribed for you to reduce the risk of a blood clot forming that can cause a stroke if you have a medical condition called atrial fibrillation (a type of irregular heartbeat).  What do You need to know about Eliquis ? Take your Eliquis TWICE DAILY - one tablet in the morning and one tablet in the evening with or without food. If you have difficulty swallowing the tablet whole please discuss with your pharmacist how to take the medication safely.  Take Eliquis exactly as prescribed by your doctor and DO NOT stop taking Eliquis without talking to the doctor who prescribed the medication.  Stopping may increase your risk of developing a stroke.  Refill your prescription before you run out.  After discharge, you should have regular check-up appointments with your healthcare provider that is prescribing your Eliquis.  In the future your dose may need to be changed if your kidney function or weight changes by a significant amount or as you get older.  What do you do if you miss a dose? If you miss a dose, take it as soon as you remember on the same day and resume taking twice daily.  Do not take more than one dose of ELIQUIS at  the same time to make up a missed dose.  Important Safety Information A possible side effect of Eliquis is bleeding. You should call your healthcare provider right away if you experience any of the following: ? Bleeding from an injury or your nose that does not stop. ? Unusual colored urine (red or dark brown) or unusual colored stools (red or black). ? Unusual bruising for unknown reasons. ? A serious fall or if you hit your head (even if there is no bleeding).  Some medicines may interact with Eliquis and might increase your risk of bleeding or clotting while on Eliquis. To help avoid this, consult your healthcare provider or pharmacist prior to using any new prescription or non-prescription medications, including herbals, vitamins, non-steroidal anti-inflammatory drugs (NSAIDs) and supplements.  This website has more information on Eliquis (apixaban): http://www.eliquis.com/eliquis/home

## 2018-09-08 NOTE — NC FL2 (Addendum)
[place] LEVEL OF CARE SCREENING TOOL     IDENTIFICATION  Patient Name: [name] Birthdate: 08-08-1946 Sex: female Admission Date (Current Location): 09/06/2018  [facility] and [place] Number:  [employer] and Address:  [facility], [place] [address] [place], [place] [address]      Provider Number: [id number]  Attending Physician Name and Address:  Angelia Mould, *  Relative Name and Phone Number:  Dana [place] (spouse) [phone]    Current Level of Care: Hospital Recommended Level of Care: [place] Prior Approval Number:    Date Approved/Denied:   PASRR Number: [id number]  Discharge Plan: SNF    Current Diagnoses: Patient Active Problem List   Diagnosis Date Noted  . S/P AKA (above knee amputation) unilateral, right ([place])   . Status post amputation of left foot through metatarsal bone ([place])   . Acute blood loss anemia   . Orthostasis   . Diabetes mellitus type 2 in nonobese (HCC)   . PAF (paroxysmal atrial fibrillation) ([place])   . PAD (peripheral artery disease) ([place]) 09/06/2018  . History of trichomonal vaginitis 08/18/2018  . Hypoglycemia 07/29/2018  . Trichomonal vaginitis 07/12/2018  . Atrial flutter ([place])   . SVT (supraventricular tachycardia) ([place])   . ESRD on dialysis ([place])   . Steroid-induced hyperglycemia   . Type II diabetes mellitus with peripheral autonomic neuropathy (HCC)   . Anemia of chronic disease   . Prediabetes   . Postoperative pain   . Ischemic leg pain   . Unilateral AKA, right ([place])   . Post-operative pain   . Diabetic retinopathy associated with type 2 diabetes mellitus ([place])   . Diabetic nephropathy associated with type 2 diabetes mellitus ([place])   . Leukocytosis   . Non-healing wound of lower extremity 06/08/2018  . Malnutrition of moderate degree 03/03/2018  . Leg wound, right 03/01/2018  . Ulcer of right calf ([place]) 02/10/2018  . Chronic RLQ pain  01/11/2018  . Ischemic ulcer of toes on both feet ([place])   . Cellulitis in diabetic foot ([place]) 12/14/2017  . Atrial fibrillation ([place]) 12/14/2017  . Glaucoma 12/14/2017  . ESRD (end stage renal disease) on dialysis ([place]) 11/16/2017  . COPD with acute exacerbation ([place]) 11/03/2017  . Community acquired pneumonia 11/02/2017  . Nausea and vomiting 11/02/2017  . Iron deficiency anemia 08/07/2017  . Heme positive stool 10/04/2015  . Mixed hyperlipidemia 06/29/2015  . Type 2 diabetes mellitus with stage 4 chronic kidney disease, with long-term current use of insulin ([place]) 06/29/2015  . Chronic kidney disease, stage V ([place]) 04/12/2014  . 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 Acute respiratory failure with hypoxia ([place]) 03/15/2014  . Chronic diastolic heart failure ([place]) 03/15/2014  . Anemia of chronic renal failure, stage 4 (severe) ([place]) 02/23/2014  . Folate deficiency 02/17/2014  . Aortic regurgitation 09/19/2012  . Class 2 severe obesity due to excess calories with serious comorbidity and body mass index (BMI) of 35.0 to 35.9 in adult ([place]) 09/14/2012  . Type 2 diabetes mellitus with diabetic neuropathy ([place]) 09/14/2012  . SLE (systemic lupus erythematosus) ([place]) 09/14/2012  . Essential hypertension, benign 09/14/2012  . COPD (chronic obstructive pulmonary disease) ([place]) 09/14/2012    Orientation RESPIRATION BLADDER Height & Weight     Self, Time, Situation, Place  Normal Continent Weight: 62.2 kg Height:     BEHAVIORAL SYMPTOMS/MOOD NEUROLOGICAL BOWEL NUTRITION STATUS      Continent Diet(carb modified,  renal, see discharge summary)  AMBULATORY STATUS COMMUNICATION OF NEEDS Skin   Extensive Assist Verbally Surgical wounds, Other (Comment)(right leg and left foot amputation, buttocks open ulcer, left foot closed surgical incision)                       Personal Care Assistance Level of Assistance  Bathing, Feeding,  Dressing, Total care Bathing Assistance: Maximum assistance Feeding assistance: Limited assistance Dressing Assistance: Maximum assistance Total Care Assistance: Maximum assistance   Functional Limitations Info  Sight, Speech, Hearing Sight Info: Adequate Hearing Info: Adequate Speech Info: Adequate    SPECIAL CARE FACTORS FREQUENCY  PT (By licensed PT), OT (By licensed OT)     PT Frequency: min 5x weekly OT Frequency: min 5x weekly            Contractures Contractures Info: Not present    Additional Factors Info  Code Status, Allergies   Dialysis Tues, Thurs, Sat at [facility] [address], [place] [address] Code Status Info: full Allergies Info: Allergies:  Ace Inhibitors           Current Medications (09/08/2018):  This is the current hospital active medication list Current Facility-Administered Medications  Medication Dose Route Frequency Provider Last Rate Last Dose  . 0.9 %  sodium chloride infusion  250 mL Intravenous PRN Laurence Slate M, PA-C      . acetaminophen (TYLENOL) tablet 325-650 mg  325-650 mg Oral Q4H PRN Laurence Slate M, PA-C       Or  . acetaminophen (TYLENOL) suppository 325-650 mg  325-650 mg Rectal Q4H PRN Laurence Slate M, PA-C      . albuterol (PROVENTIL) (2.5 MG/3ML) 0.083% nebulizer solution 2.5 mg  2.5 mg Inhalation Q6H PRN Laurence Slate M, PA-C      . allopurinol (ZYLOPRIM) tablet 100 mg  100 mg Oral Q M,W,F Laurence Slate M, PA-C   100 mg at 09/08/18 1443  . amiodarone (PACERONE) tablet 200 mg  200 mg Oral Daily Laurence Slate M, PA-C   200 mg at 09/08/18 0932  . apixaban (ELIQUIS) tablet 5 mg  5 mg Oral BID Laurence Slate M, PA-C   5 mg at 09/08/18 0932  . atorvastatin (LIPITOR) tablet 20 mg  20 mg Oral QHS Laurence Slate M, PA-C   20 mg at 09/07/18 2255  . bisacodyl (DULCOLAX) EC tablet 5 mg  5 mg Oral Daily PRN Ulyses Amor, PA-C   5 mg at 09/08/18 1540  . brimonidine (ALPHAGAN) 0.2 % ophthalmic solution 1 drop  1 drop Left Eye BID  Hammons, Kimberly B, RPH   1 drop at 09/08/18 0935   And  . timolol (TIMOPTIC) 0.5 % ophthalmic solution 1 drop  1 drop Left Eye BID Hammons, Kimberly B, RPH   1 drop at 09/08/18 0934  . calcium acetate (PHOSLO) capsule 667 mg  667 mg Oral TID WC Ulyses Amor, PA-C   667 mg at 09/08/18 1103  . Chlorhexidine Gluconate Cloth 2 % PADS 6 each  6 each Topical Q0600 Loren Racer, PA-C   6 each at 09/07/18 0867  . Darbepoetin Alfa (ARANESP) injection 200 mcg  200 mcg Intravenous Q Tue-HD Loren Racer, PA-C   200 mcg at 09/07/18 1602  . docusate sodium (COLACE) capsule 100 mg  100 mg Oral Daily Laurence Slate M, PA-C   100 mg at 09/08/18 6195  . feeding supplement (NEPRO CARB STEADY) liquid 237 mL  237 mL Oral BID  BM Angelia Mould, MD   237 mL at 09/08/18 1429  . feeding supplement (PRO-STAT SUGAR FREE 64) liquid 30 mL  30 mL Oral BID Laurence Slate M, PA-C   30 mL at 09/08/18 0933  . fentaNYL (DURAGESIC - dosed mcg/hr) patch 25 mcg  25 mcg Transdermal Q72H Ulyses Amor, PA-C   25 mcg at 09/06/18 1513  . folic acid (FOLVITE) tablet 1 mg  1 mg Oral Daily Laurence Slate M, PA-C   1 mg at 09/08/18 0932  . guaiFENesin-dextromethorphan (ROBITUSSIN DM) 100-10 MG/5ML syrup 15 mL  15 mL Oral Q4H PRN Laurence Slate M, PA-C      . hydrALAZINE (APRESOLINE) injection 5 mg  5 mg Intravenous Q20 Min PRN Laurence Slate M, PA-C      . hydroxychloroquine (PLAQUENIL) tablet 200 mg  200 mg Oral Daily Laurence Slate M, PA-C   200 mg at 09/08/18 0932  . insulin aspart (novoLOG) injection 0-15 Units  0-15 Units Subcutaneous TID WC Ulyses Amor, PA-C   3 Units at 09/08/18 1228  . insulin detemir (LEVEMIR) injection 5 Units  5 Units Subcutaneous QHS Collins, Emma M, PA-C      . labetalol (NORMODYNE,TRANDATE) injection 10 mg  10 mg Intravenous Q10 min PRN Laurence Slate M, PA-C      . metoprolol tartrate (LOPRESSOR) injection 2-5 mg  2-5 mg Intravenous Q2H PRN Laurence Slate M, PA-C      . metoprolol tartrate  (LOPRESSOR) tablet 12.5 mg  12.5 mg Oral 2 times per day on Mon Wed Fri Laurence Slate M, PA-C   12.5 mg at 09/08/18 0865  . morphine 2 MG/ML injection 1-2 mg  1-2 mg Intravenous Q2H PRN Laurence Slate M, PA-C   2 mg at 09/07/18 2351  . multivitamin with minerals tablet 1 tablet  1 tablet Oral Daily Ulyses Amor, PA-C   1 tablet at 09/08/18 0932  . ondansetron (ZOFRAN) injection 4 mg  4 mg Intravenous Q6H PRN Laurence Slate M, PA-C      . oxyCODONE (Oxy IR/ROXICODONE) immediate release tablet 5-10 mg  5-10 mg Oral Q4H PRN Laurence Slate M, PA-C   10 mg at 09/08/18 1103  . pantoprazole (PROTONIX) EC tablet 40 mg  40 mg Oral Daily Laurence Slate M, PA-C   40 mg at 09/08/18 7846  . phenol (CHLORASEPTIC) mouth spray 1 spray  1 spray Mouth/Throat PRN Laurence Slate M, PA-C      . predniSONE (DELTASONE) tablet 5 mg  5 mg Oral Q breakfast Laurence Slate M, PA-C   5 mg at 09/08/18 9629  . senna-docusate (Senokot-S) tablet 1 tablet  1 tablet Oral QHS PRN Ulyses Amor, PA-C      . sodium chloride flush (NS) 0.9 % injection 3 mL  3 mL Intravenous Q12H Laurence Slate M, PA-C   3 mL at 09/08/18 5284  . sodium chloride flush (NS) 0.9 % injection 3 mL  3 mL Intravenous PRN Ulyses Amor, PA-C      . Travoprost (BAK Free) (TRAVATAN) 0.004 % ophthalmic solution SOLN 1 drop  1 drop Both Eyes QHS Laurence Slate M, [place]   1 drop at 09/07/18 2258     Discharge Medications: Please see discharge summary for a list of discharge medications.  Relevant Imaging Results:  Relevant Lab Results:   Additional Information SSN: [id number]  Alberteen Sam, LCSW

## 2018-09-08 NOTE — Progress Notes (Addendum)
[facility] Progress Note   Subjective: Seen in room. + leg pain, but tolerable. Denies CP/dyspnea.    Objective Vitals:   09/07/18 1600 09/07/18 1608 09/07/18 2006 09/08/18 0440  BP: (!) 157/62 (!) 152/59 (!) 165/48 (!) 142/42  Pulse: 83 83 88 80  Resp:  20 14 14   Temp:  98.3 F (36.8 C) 98.8 F (37.1 C) 100 F (37.8 C)  TempSrc:  Oral Oral Oral  SpO2:  98% 100% 100%  Weight:  62.2 kg     Physical Exam General: Well appearing elderly woman, NAD. Heart: RRR; no murmur Lungs: CTAB Abdomen: soft, non-tender Extremities: R AKA, L foot bandaged; no edema Dialysis Access: TDC in R chest  Additional Objective Labs: Basic Metabolic Panel: Recent Labs  Lab 09/06/18 0732 09/07/18 0251 09/08/18 0218  NA 138 134* 134*  K 3.6 4.3 3.1*  CL 98 98 97*  CO2 26 22 26   GLUCOSE 80 139* 92  BUN 54* 63* 23  CREATININE 4.58* 5.07* 2.68*  CALCIUM 8.2* 8.0* 8.0*   CBC: Recent Labs  Lab 09/06/18 0732 09/07/18 0251 09/08/18 0218  WBC 7.0 9.8 10.5  HGB 9.0* 8.5* 7.6*  HCT 30.6* 29.2* 26.1*  MCV 93.9 93.3 92.2  PLT 230 262 227   CBG: Recent Labs  Lab 09/07/18 0643 09/07/18 1112 09/07/18 1553 09/07/18 2135 09/08/18 0612  GLUCAP 124* 172* 86 81 77   Medications: . sodium chloride     . allopurinol  100 mg Oral Q M,W,F  . amiodarone  200 mg Oral Daily  . apixaban  5 mg Oral BID  . atorvastatin  20 mg Oral QHS  . brimonidine  1 drop Left Eye BID   And  . timolol  1 drop Left Eye BID  . calcium acetate  667 mg Oral TID WC  . Chlorhexidine Gluconate Cloth  6 each Topical Q0600  . darbepoetin (ARANESP) injection - DIALYSIS  200 mcg Intravenous Q Tue-HD  . docusate sodium  100 mg Oral Daily  . feeding supplement (NEPRO CARB STEADY)  237 mL Oral BID BM  . feeding supplement (PRO-STAT SUGAR FREE 64)  30 mL Oral BID  . fentaNYL  25 mcg Transdermal Q72H  . folic acid  1 mg Oral Daily  . hydroxychloroquine  200 mg Oral Daily  . insulin aspart  0-15 Units  Subcutaneous TID WC  . insulin detemir  5 Units Subcutaneous QHS  . metoprolol tartrate  12.5 mg Oral 2 times per day on Mon Wed Fri  . multivitamin with minerals  1 tablet Oral Daily  . pantoprazole  40 mg Oral Daily  . predniSONE  5 mg Oral Q breakfast  . sodium chloride flush  3 mL Intravenous Q12H  . Travoprost (BAK Free)  1 drop Both Eyes QHS    Dialysis Orders: TTS at [facility] 3:30hr, 400/800, EDW 62.5kg, 2K/2.5ca, TDC, heparin 2000 - Mircera 280mcg IV q 2 weeks (last 11/25)  Assessment/Plan: 1. L toe dry gangrene (s/p L TMA 09/06/18): Severe PAD. Per vascular surgery. 2. ESRD:Continue HD per usual TTS schedule, next 12/12. Will hold heparin. 3. Hypertension/volume:BP slightly high, no edema on exam. Low UF goal. 4. Anemia:Hgb 7.6. Aranesp 200 given 12/10 - will transfuse if lower tomorrow. 5. Metabolic bone disease:Ca ok, Phos pending. 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 Continue home meds for now. 6. Nutrition:Will start protein supplements for wound healing. 7.  Type 2 DM 8.  A-fib (on Eliquis)  Veneta Penton, PA-C 09/08/2018, 10:15  AM  [facility] Pager: [phone]  Pt seen, examined and agree w A/P as above.  Kelly Splinter MD [employer] pager [phone]   09/08/2018, 12:57 PM

## 2018-09-08 NOTE — Progress Notes (Signed)
Inpatient Rehabilitation Admissions Coordinator  I have received a denial from [facility] for an inpt rehab admit. I contacted pt's spouse by phone and he is aware. He does want to pursue SNF placement. I have notified RN CM, Manuela Schwartz, and SW, [place]. We will sign off at this time.  Danne Baxter, RN, MSN Rehab Admissions Coordinator [phone] 09/08/2018 1:53 PM

## 2018-09-08 NOTE — Plan of Care (Signed)

## 2018-09-08 NOTE — Progress Notes (Signed)
Physical Therapy Treatment Patient Details Name: [name] MRN: [id number] DOB: April 23, 1946 Today's Date: 09/08/2018    History of Present Illness 72 y.o. female who underwent a right above-the-knee amputation on 06/08/2018. Presents with dry gangrene of the left forefoot with tibial artery occlusive disease. S/p L transmetatarsal amputation. PMH includes diabetes, end-stage renal disease, and tibial artery occlusive disease with prior arteriogram.      PT Comments    Pt agreeable to transferring from bed into drop arm recliner utilizing the sliding board. Pt requires modA for coming to the EoB and for placement of sliding board and maxA for offloading bilateral hips to slide across board. Pt unable to bear any weight through L heel due to increase in pain. [name] reports that prior to surgery pt required assist for sliding board placement but was able to assist in transfers into her wheelchair. CIR placement has been refused by her insurance company, so PT now recommends SNF level rehab at d/c to facilitate further instruction in safe transfers with sliding board while maintaining PWB precautions with Darco shoe as well as to improve strength and endurance..   Follow Up Recommendations  SNF     Equipment Recommendations  Other (comment)(TBD at next venue)    Recommendations for Other Services Rehab consult     Precautions / Restrictions Precautions Precautions: Fall Required Braces or Orthoses: Other Brace(Darco shoe for weightbearing through heel) Restrictions Weight Bearing Restrictions: Yes LUE Weight Bearing: Partial weight bearing Other Position/Activity Restrictions: weightbearing through heel only    Mobility  Bed Mobility Overal bed mobility: Needs Assistance Bed Mobility: Supine to Sit     Supine to sit: Mod assist     General bed mobility comments: modA for scooting hips to EoB due to increased L LE pain with movement  Transfers Overall transfer  level: Needs assistance Equipment used: 2 person hand held assist Transfers: Lateral/Scoot Transfers          Lateral/Scoot Transfers: Max assist;With Tax inspector transfer comment: modA for sliding board placement, maxA for facilitating offweighting of bilateral hips to slide laterally to L , vc for weightbearing through L heel however pt with decreased ability for weightbearing due to pain   Ambulation/Gait             General Gait Details: unable to attempt       Balance Overall balance assessment: Modified Independent(in seated )                                          Cognition Arousal/Alertness: Awake/alert Behavior During Therapy: Flat affect Overall Cognitive Status: Within Functional Limits for tasks assessed                                           General Comments General comments (skin integrity, edema, etc.): Pt with c/o of pain in R LQ, area has moderate distension. RN notified. Family in room during session. Pt husband relayed that he is able to help pt with sliding board transfer      Pertinent Vitals/Pain Pain Assessment: Faces Faces Pain Scale: Hurts whole lot Pain Location: L foot surgical site Pain Descriptors / Indicators: Grimacing;Guarding;Throbbing;Sharp;Shooting;Sore Pain Intervention(s): Limited activity within patient's tolerance;Monitored during session;Repositioned  PT Goals (current goals can now be found in the care plan section) Acute Rehab PT Goals Patient Stated Goal: have less pain PT Goal Formulation: With patient/family Time For Goal Achievement: 09/21/18 Potential to Achieve Goals: Good Progress towards PT goals: Progressing toward goals    Frequency    Min 3X/week      PT Plan Discharge plan needs to be updated    M-PAC PT "6 Clicks" Mobility   Outcome Measure  Help needed turning from your back to your side while in a flat bed without using bedrails?: A  Little Help needed moving from lying on your back to sitting on the side of a flat bed without using bedrails?: A Lot Help needed moving to and from a bed to a chair (including a wheelchair)?: A Lot Help needed standing up from a chair using your arms (e.g., wheelchair or bedside chair)?: Total Help needed to walk in hospital room?: Total Help needed climbing 3-5 steps with a railing? : Total 6 Click Score: 10    End of Session Equipment Utilized During Treatment: Gait belt Activity Tolerance: Patient tolerated treatment well;Patient limited by pain;No increased pain Patient left: in chair;with chair alarm set;with family/visitor present;with call bell/phone within reach Nurse Communication: Mobility status;Patient requests pain meds;Weight bearing status;Precautions PT Visit Diagnosis: Unsteadiness on feet (R26.81);Other abnormalities of gait and mobility (R26.89);Muscle weakness (generalized) (M62.81);Difficulty in walking, not elsewhere classified (R26.2);Pain Pain - Right/Left: Left Pain - part of body: Ankle and joints of foot     Time: 6389-3734 PT Time Calculation (min) (ACUTE ONLY): 31 min  Charges:  $Therapeutic Activity: 23-37 mins                     Anali Cabanilla B. Migdalia Dk PT, DPT Acute Rehabilitation Services Pager [phone] Office [phone]    [place] 09/08/2018, 2:59 PM

## 2018-09-08 NOTE — Progress Notes (Signed)
VASCULAR SURGERY ASSESSMENT & PLAN:   2 Days Post-Op s/p: Left transmetatarsal amputation.  So far this appears to be healing adequately.  I will have to follow this as an outpatient closely however given that there is only 50-50 percent chance of healing given her tibial artery occlusive disease.  She will need daily dressing changes likely done by the home health nurse.  She will be ready for discharge once she can go home safely after physical therapy has trained her on how to use a Darco shoe.  I wrote for dressing changes yesterday.  This had not been done but I have changed the dressing this morning.   SUBJECTIVE:   Pain under better control.  PHYSICAL EXAM:   Vitals:   09/07/18 1600 09/07/18 1608 09/07/18 2006 09/08/18 0440  BP: (!) 157/62 (!) 152/59 (!) 165/48 (!) 142/42  Pulse: 83 83 88 80  Resp:  20 14 14   Temp:  98.3 F (36.8 C) 98.8 F (37.1 C) 100 F (37.8 C)  TempSrc:  Oral Oral Oral  SpO2:  98% 100% 100%  Weight:  62.2 kg     I inspected her left transmetatarsal amputation site which so far appears to be healing adequately with minimal drainage.  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 allopurinol  100 mg Oral Q M,W,F  . amiodarone  200 mg Oral Daily    . apixaban  5 mg Oral BID  . atorvastatin  20 mg Oral QHS  . brimonidine  1 drop Left Eye BID   And  . timolol  1 drop Left Eye BID  . calcium acetate  667 mg Oral TID WC  . Chlorhexidine Gluconate Cloth  6 each Topical Q0600  . darbepoetin (ARANESP) injection - DIALYSIS  200 mcg Intravenous Q Tue-HD  . docusate sodium  100 mg Oral Daily  . feeding supplement (NEPRO CARB STEADY)  237 mL Oral BID BM  . feeding supplement (PRO-STAT SUGAR FREE 64)  30 mL Oral BID  . fentaNYL  25 mcg Transdermal Q72H  . folic acid  1 mg Oral Daily  . hydroxychloroquine  200 mg Oral Daily  . insulin aspart  0-15 Units Subcutaneous TID WC  . insulin detemir  5 Units Subcutaneous QHS  . metoprolol tartrate  12.5 mg Oral 2 times per day on Mon Wed Fri  . multivitamin with minerals  1 tablet Oral Daily  . pantoprazole  40 mg Oral Daily  . predniSONE  5 mg Oral Q breakfast  . sodium chloride flush  3 mL Intravenous Q12H  . Travoprost (BAK Free)  1 drop Both Eyes  [place]: [phone] Office: [phone] 09/08/2018

## 2018-09-08 NOTE — Progress Notes (Signed)
Orthopedic Tech Progress Note Patient Details:  [name] [birth year] [id number]  Ortho Devices Type of Ortho Device: Postop shoe/boot Ortho Device/Splint Location: lle Ortho Device/Splint Interventions: Ordered, Application, Adjustment   Post Interventions Patient Tolerated: Well Instructions Provided: Care of device, Adjustment of device   Karolee Stamps 09/08/2018, 1:38 PM

## 2018-09-08 NOTE — Progress Notes (Signed)
Initial Nutrition Assessment  DOCUMENTATION CODES:   Not applicable  INTERVENTION:   - Continue Nepro BID (each provides 425 kcal and 19 g protein) - Continue ProStat BID (each provides 100 kcal and 15 g protein) - Continue with folvite, MVI with minerals   NUTRITION DIAGNOSIS:   Increased nutrient needs related to chronic illness as evidenced by estimated needs.   GOAL:   Patient will meet greater than or equal to 90% of their needs   MONITOR:   PO intake, Supplement acceptance, Weight trends, Labs  REASON FOR ASSESSMENT:   Malnutrition Screening Tool    ASSESSMENT:   72 yo female, admitted for transmetatarsal amputation 2/2 peripheral artery disease. PMH significant for anemia, atrial fibrillation, chronic diastolic CHF with EF 68-34%, colon polyposis, COPD, ESRD on HD, HTN, h/o fx, SLE, T2DM, former smoker. Home meds include ProStat BID, Lipitor daily, Dialyvite 800 daily, Folvite daily, humalog TID with meals, Protonix daily.  Labs: sodium 134, potassium 3.1, chloride 97, Creatinine 2.68, GFR 17%/20%, Hbg 7.6, Hct 26.1%, Hgb A1c 4.5% (L) Meds: Lipitor 20 mg daily, Phoslo 667 mg TID with meals, colace 100 mg daily, Nepro Carb Steady BI, ProStat BID, folvite daily, novolog TID with meals, Levemir 5 units daily, MVI with minerals, Dulcolax EC 5 mg daily PRN  Pt resting with meal tray in front her her, family present at time of nutrition visit.  Per husband, pt weighed 245# ~18 mo ago --> 108# (49.1 kg)/44% wt loss. This is clinically significant. Per chart, 6 mo wt hx stable around 62-65 kg with 3 wts documented at 78-79 kg in July, Sept, and Oct 2019.   Pt states she wishes her appetite was better, but is making an effort at meal times. Pt had vegetable lasagna for lunch today, which she ate most of, but left the bread and roll. Appetite has been poor for the last several months. Typically eats 3 meals a day, mostly picks at meals. Does not follow any special diet. Used  to take [employer], but stopped.   Denies nausea or vomiting, diarrhea, or difficulty chewing. Reports some trouble swallowing dry foods - encouraged her to make sure food is moist with gravies/sauces and to have a drink handy. Endorses constipation - last BM 12/7. Received Dulcolax today.  Encouraged pt to include protein-rich foods with meals and snacks, and to eat those foods first if not feeling hungry. Pt amenable to continuing Nepro and ProStat BID.  NUTRITION - FOCUSED PHYSICAL EXAM:   Most Recent Value  Orbital Region  No depletion  Upper Arm Region  No depletion  Thoracic and Lumbar Region  No depletion  Buccal Region  Mild depletion  Temple Region  No depletion  Clavicle Bone Region  Mild depletion  Clavicle and Acromion Bone Region  No depletion  Scapular Bone Region  No depletion  Dorsal Hand  No depletion  Patellar Region  Mild depletion  Anterior Thigh Region  Mild depletion  Posterior Calf Region  Mild depletion  Edema (RD Assessment)  None  Hair  Unable to assess  Eyes  Reviewed  Mouth  Reviewed  Skin  Reviewed  Nails  Reviewed      Diet Order:  40-80% of 2 meals completed, per nsg documentation Diet Order            Diet renal with fluid restriction Fluid restriction: 1200 mL Fluid; Room service appropriate? Yes; Fluid consistency: Thin  Diet effective now  EDUCATION NEEDS:  No education needs have been identified at this time  Skin:  Skin Assessment: Skin Integrity Issues: Skin Integrity Issues:: Other (Comment), Incisions, Stage II Stage II: buttocks Incisions: L foot Other: Pressure injury (BL feet), amputation  Last BM:  PTA  Height:  Ht Readings from Last 1 Encounters:  08/11/18 5\' 5"  (1.651 m)    Weight:  Wt Readings from Last 1 Encounters:  09/07/18 62.2 kg    Ideal Body Weight:  56.8 kg  BMI:  Body mass index is 22.82 kg/m.  Estimated Nutritional Needs:   Kcal:  6060-0459 calories daily (30-35 kcal/kg  ABW)  Protein:  75-93 gm daily (1.2-1.5 g/kg ABW)  Fluid:  1200 mL daily  Althea Grimmer, MS, RDN, LDN Pager: [phone]

## 2018-09-09 DIAGNOSIS — L8993 Pressure ulcer of unspecified site, stage 3: Secondary | ICD-10-CM

## 2018-09-09 LAB — RENAL FUNCTION PANEL
Albumin: 1.6 g/dL — ABNORMAL LOW (ref 3.5–5.0)
Anion gap: 13 (ref 5–15)
BUN: 47 mg/dL — ABNORMAL HIGH (ref 8–23)
CO2: 27 mmol/L (ref 22–32)
Calcium: 8.1 mg/dL — ABNORMAL LOW (ref 8.9–10.3)
Chloride: 95 mmol/L — ABNORMAL LOW (ref 98–111)
Creatinine, Ser: 4.55 mg/dL — ABNORMAL HIGH (ref 0.44–1.00)
GFR calc Af Amer: 10 mL/min — ABNORMAL LOW (ref >60)
GFR calc non Af Amer: 9 mL/min — ABNORMAL LOW (ref >60)
Glucose, Bld: 83 mg/dL (ref 70–99)
Phosphorus: 4 mg/dL (ref 2.5–4.6)
Potassium: 3.3 mmol/L — ABNORMAL LOW (ref 3.5–5.1)
Sodium: 135 mmol/L (ref 135–145)

## 2018-09-09 LAB — GLUCOSE, CAPILLARY
GLUCOSE-CAPILLARY: 95 mg/dL (ref 70–99)
Glucose-Capillary: 259 mg/dL — ABNORMAL HIGH (ref 70–99)
Glucose-Capillary: 47 mg/dL — ABNORMAL LOW (ref 70–99)
Glucose-Capillary: 30 mg/dL — CL (ref 70–99)
Glucose-Capillary: 65 mg/dL — ABNORMAL LOW (ref 70–99)
Glucose-Capillary: 136 mg/dL — ABNORMAL HIGH (ref 70–99)
Glucose-Capillary: 138 mg/dL — ABNORMAL HIGH (ref 70–99)

## 2018-09-09 LAB — CBC
HCT: 26.1 % — ABNORMAL LOW (ref 36.0–46.0)
Hemoglobin: 7.6 g/dL — ABNORMAL LOW (ref 12.0–15.0)
MCH: 27.2 pg (ref 26.0–34.0)
MCHC: 29.1 g/dL — ABNORMAL LOW (ref 30.0–36.0)
MCV: 93.5 fL (ref 80.0–100.0)
Platelets: 261 10*3/uL (ref 150–400)
RBC: 2.79 MIL/uL — AB (ref 3.87–5.11)
RDW: 17.3 % — ABNORMAL HIGH (ref 11.5–15.5)
WBC: 11.3 10*3/uL — ABNORMAL HIGH (ref 4.0–10.5)
nRBC: 0 % (ref 0.0–0.2)

## 2018-09-09 MED ORDER — DEXTROSE 50 % IV SOLN
INTRAVENOUS | Status: AC
  Administered 2018-09-09: 07:00:00
  Filled 2018-09-09: qty 50

## 2018-09-09 MED ORDER — INSULIN ASPART 100 UNIT/ML ~~LOC~~ SOLN
0.0000 [IU] | Freq: Three times a day (TID) | SUBCUTANEOUS | Status: DC
  Administered 2018-09-09 – 2018-09-10 (×2): 1 [IU] via SUBCUTANEOUS
  Administered 2018-09-11: 2 [IU] via SUBCUTANEOUS
  Administered 2018-09-11 – 2018-09-12 (×2): 3 [IU] via SUBCUTANEOUS

## 2018-09-09 MED ORDER — HEPARIN SODIUM (PORCINE) 1000 UNIT/ML IJ SOLN
INTRAMUSCULAR | Status: AC
  Administered 2018-09-09: 1000 [IU]
  Filled 2018-09-09: qty 4

## 2018-09-09 MED ORDER — DEXTROSE 50 % IV SOLN
INTRAVENOUS | Status: AC
  Filled 2018-09-09: qty 50

## 2018-09-09 MED ORDER — INSULIN ASPART 100 UNIT/ML ~~LOC~~ SOLN
0.0000 [IU] | Freq: Every day | SUBCUTANEOUS | Status: DC
  Administered 2018-09-12: 2 [IU] via SUBCUTANEOUS

## 2018-09-09 NOTE — Progress Notes (Signed)
Hypoglycemic Event  CBG: 30  Treatment: 8 ounces orange juice and 1 cup of ice cream  Symptoms: asymptomatic   Follow-up CBG: Time: 12:07 and 12:33  CBG Result: 65 and 95  Possible Reasons for Event: poor appetite  Comments/MD notified: Dr. Scot Dock made aware, levemir discontinued, SS changed to sensitive scale.    Debbora Dus

## 2018-09-09 NOTE — Care Management Important Message (Signed)
Important Message  Patient Details  Name: [name] MRN: [id number] Date of Birth: 02/09/1946   Medicare Important Message Given:  Yes    Orbie Pyo 09/09/2018, 4:06 PM

## 2018-09-09 NOTE — Progress Notes (Signed)
CSW has been in contact with patient husband 12/11 and today regarding SNF in which they are accepting of skilled nursing with preference of [facility].   [facility] has started insurance authorization on this day.   CSW will continue to follow for insurance auth and future discharge planning concerns.  [place], [place]

## 2018-09-09 NOTE — Progress Notes (Addendum)
  [facility] Progress Note   Subjective:  Seen on HD, 1.5L UF goal, tolerating without issues. Denies CP/dyspnea, still with leg pain (stable).   Objective Vitals:   09/09/18 0728 09/09/18 0739 09/09/18 0744 09/09/18 0800  BP: (!) 131/48 (!) 118/38 (!) 116/42 (!) 113/41  Pulse: 65 61 61 63  Resp: 16     Temp: 97.8 F (36.6 C)     TempSrc: Oral     SpO2: 100%     Weight: 61.7 kg      Physical Exam General:Well appearing elderly woman, NAD. Heart:RRR; no murmur Lungs:CTAB Abdomen:soft, non-tender Extremities:R AKA, L foot bandaged; no edema Dialysis Access:TDC in R chest  Additional Objective Labs: Basic Metabolic Panel: Recent Labs  Lab 09/06/18 0732 09/07/18 0251 09/08/18 0218  NA 138 134* 134*  K 3.6 4.3 3.1*  CL 98 98 97*  CO2 26 22 26   GLUCOSE 80 139* 92  BUN 54* 63* 23  CREATININE 4.58* 5.07* 2.68*  CALCIUM 8.2* 8.0* 8.0*   CBC: Recent Labs  Lab 09/06/18 0732 09/07/18 0251 09/08/18 0218 09/09/18 0757  WBC 7.0 9.8 10.5 11.3*  HGB 9.0* 8.5* 7.6* 7.6*  HCT 30.6* 29.2* 26.1* 26.1*  MCV 93.9 93.3 92.2 93.5  PLT 230 262 227 261   Medications: . sodium chloride     . allopurinol  100 mg Oral Q M,W,F  . amiodarone  200 mg Oral Daily  . apixaban  5 mg Oral BID  . atorvastatin  20 mg Oral QHS  . brimonidine  1 drop Left Eye BID   And  . timolol  1 drop Left Eye BID  . calcium acetate  667 mg Oral TID WC  . Chlorhexidine Gluconate Cloth  6 each Topical Q0600  . darbepoetin (ARANESP) injection - DIALYSIS  200 mcg Intravenous Q Tue-HD  . docusate sodium  100 mg Oral Daily  . feeding supplement (NEPRO CARB STEADY)  237 mL Oral BID BM  . feeding supplement (PRO-STAT SUGAR FREE 64)  30 mL Oral BID  . fentaNYL  25 mcg Transdermal Q72H  . folic acid  1 mg Oral Daily  . hydroxychloroquine  200 mg Oral Daily  . insulin aspart  0-15 Units Subcutaneous TID WC  . insulin detemir  5 Units Subcutaneous QHS  . metoprolol tartrate  12.5 mg  Oral 2 times per day on Mon Wed Fri  . multivitamin with minerals  1 tablet Oral Daily  . pantoprazole  40 mg Oral Daily  . predniSONE  5 mg Oral Q breakfast  . sodium chloride flush  3 mL Intravenous Q12H  . Travoprost (BAK Free)  1 drop Both Eyes QHS    Dialysis Orders: TTS at [facility] 3:30hr, 400/800, EDW 62.5kg, 2K/2.5ca, TDC, heparin 2000 - Mircera 285mcg IV q 2 weeks (last 11/25)  Assessment/Plan: 1. L toe dry gangrene (s/p L TMA 09/06/18): Severe PAD. Per vascular surgery. 2. ESRD:Continue HD per usual TTS schedule, next 12/12. Holding heparin. 3. Hypertension/volume:BP controlled, no edema on exam. Low UF goal. 4. Anemia:Hgb7.6. Aranesp 200 given 12/10, follow. 5.  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 6. Nutrition:Continue protein supplements for wound healing. 7. Type 2 DM 8. A-fib (on Eliquis)  Veneta Penton, PA-C 09/09/2018, 8:24 AM  [facility] Pager: [phone]  Pt seen, examined and agree w A/P as above.  Kelly Splinter MD [employer] pager [phone]   09/09/2018, 12:31 PM

## 2018-09-09 NOTE — Progress Notes (Signed)
VASCULAR SURGERY ASSESSMENT & PLAN:   3 Days Post-Op s/p: Left transmetatarsal amputation.  I looked at her wound yesterday she had a small amount of drainage.  I think there is a 50-50 chance of this healing.  Based on her previous arteriogram she has no options for revascularization.  DISPOSITION: The patient was seen by CIR yesterday and was denied from [facility] for inpatient rehab.  The husband does not want skilled nursing placement.  Therefore when physical therapy feels that it safe for her to go home she can go home partial weightbearing on the left foot with a Darco shoe heel only.  She is afebrile.  Her white blood cell count is 10.5.  Currently she is not and antibiotics.  If she continues to have drainage from the wound or this increases I think she should be started on po antibiotics.  SUBJECTIVE:   No complaints this morning.  She just arrived in the dialysis unit.  PHYSICAL EXAM:   Vitals:   09/08/18 1300 09/08/18 2012 09/09/18 0300 09/09/18 0450  BP: (!) 128/43 (!) 148/40  (!) 128/51  Pulse: 71 66  60  Resp: 16 16  16   Temp: 99.2 F (37.3 C) 98.7 F (37.1 C)  98.1 F (36.7 C)  TempSrc: Oral Oral  Oral  SpO2: 100% 100%  100%  Weight:   62 kg    The dressing on her left transmetatarsal amputation site currently is dry.  LABS:   Lab Results  Component Value Date   WBC 10.5 09/08/2018   HGB 7.6 (L) 09/08/2018   HCT 26.1 (L) 09/08/2018   MCV 92.2 09/08/2018   PLT 227 09/08/2018   Lab Results  Component Value Date   CREATININE 2.68 (H) 09/08/2018   Lab Results  Component Value Date   INR 1.18 06/08/2018   CBG (last 3)  Recent Labs    09/08/18 2117 09/09/18 0630 09/09/18 0658  GLUCAP 87 47* 259*    PROBLEM LIST:    Active Problems:   PAD (peripheral artery disease) (HCC)   S/P AKA (above knee amputation) unilateral, right (HCC)   Status post amputation of left foot through metatarsal bone (HCC)   Acute blood loss anemia   Orthostasis  Diabetes mellitus type 2 in nonobese (HCC)   PAF (paroxysmal atrial fibrillation) (HCC)   Pressure injury of skin   CURRENT MEDS:   . allopurinol  100 mg Oral Q M,W,F  . amiodarone  200 mg Oral Daily  . apixaban  5 mg Oral BID  . atorvastatin  20 mg Oral QHS  . brimonidine  1 drop Left Eye BID   And  . timolol  1 drop Left Eye BID  . calcium acetate  667 mg Oral TID WC  . Chlorhexidine Gluconate Cloth  6 each Topical Q0600  . darbepoetin (ARANESP) injection - DIALYSIS  200 mcg Intravenous Q Tue-HD  . docusate sodium  100 mg Oral Daily  . feeding supplement (NEPRO CARB STEADY)  237 mL Oral BID BM  . feeding supplement (PRO-STAT SUGAR FREE 64)  30 mL Oral BID  . fentaNYL  25 mcg Transdermal Q72H  . folic acid  1 mg Oral Daily  . hydroxychloroquine  200 mg Oral Daily  . insulin aspart  0-15 Units Subcutaneous TID WC  . insulin detemir  5 Units Subcutaneous QHS  . metoprolol tartrate  12.5 mg Oral 2 times per day on Mon Wed Fri  . multivitamin with minerals  1 tablet  Oral Daily  . pantoprazole  40 mg Oral Daily  . predniSONE  5 mg Oral Q breakfast  . sodium chloride flush  3 mL Intravenous Q12H  . Travoprost (BAK Free)  1 drop Both Eyes QHS    [name] Beeper: [phone] Office: [phone] 09/09/2018

## 2018-09-09 NOTE — Care Management Important Message (Signed)
Important Message  Patient Details  Name: [name] MRN: [id number] Date of Birth: [birth year]   Medicare Important Message Given:  Yes    Toshi Ishii [address] 09/09/2018, 4:03 PM

## 2018-09-09 NOTE — Progress Notes (Signed)
Blood sugar this am 49, asymptomatic and denies complaints. 1/2 amp D50 given per protocol.  Current blood sugar 20 mins after dextrose given 259, to recheck blood sugar in a few.

## 2018-09-09 NOTE — Progress Notes (Signed)
Results for FENDI, MEINHARDT (MRN [id number]) as of 09/09/2018 11:33  Ref. Range 09/08/2018 11:05 09/08/2018 16:14 09/08/2018 21:17 09/09/2018 06:30 09/09/2018 06:58  Glucose-Capillary Latest Ref Range: 70 - 99 mg/dL 162 (H) 159 (H) 87 47 (L) 259 (H)  Noted that patient had a CBG of 47 mg/dl this am at 0630.  Recommend discontinuing Levemir 5 units daily and decreasing Novolog correction scale to SENSITIVE (0-9 units) if blood sugars continue to be low.   Harvel Ricks RN BSN CDE Diabetes Coordinator Pager: [phone]  8am-5pm

## 2018-09-10 ENCOUNTER — Telehealth: Payer: Self-pay | Admitting: Vascular Surgery

## 2018-09-10 LAB — GLUCOSE, CAPILLARY
GLUCOSE-CAPILLARY: 108 mg/dL — AB (ref 70–99)
Glucose-Capillary: 93 mg/dL (ref 70–99)
Glucose-Capillary: 130 mg/dL — ABNORMAL HIGH (ref 70–99)
Glucose-Capillary: 159 mg/dL — ABNORMAL HIGH (ref 70–99)

## 2018-09-10 NOTE — Progress Notes (Addendum)
  Progress Note    09/10/2018 8:52 AM 4 Days Post-Op  Subjective:  Worried about her foot healing  afebrile  Vitals:   09/09/18 2118 09/10/18 0449  BP: (!) 169/48 (!) 148/54  Pulse: 79 76  Resp: 18 20  Temp: 98.5 F (36.9 C) 98.2 F (36.8 C)  SpO2: 100% 99%    Physical Exam: Incisions:        CBC    Component Value Date/Time   WBC 11.3 (H) 09/09/2018 0757   RBC 2.79 (L) 09/09/2018 0757   HGB 7.6 (L) 09/09/2018 0757   HGB 8.4 (L) 07/17/2017 1025   HCT 26.1 (L) 09/09/2018 0757   HCT 24.9 (L) 12/23/2017 1527   PLT 261 09/09/2018 0757   MCV 93.5 09/09/2018 0757   MCH 27.2 09/09/2018 0757   MCHC 29.1 (L) 09/09/2018 0757   RDW 17.3 (H) 09/09/2018 0757   LYMPHSABS 0.4 (L) 07/29/2018 1211   MONOABS 0.7 07/29/2018 1211   EOSABS 0.1 07/29/2018 1211   BASOSABS 0.0 07/29/2018 1211    BMET    Component Value Date/Time   NA 135 09/09/2018 0757   NA 140 08/09/2018 1159   K 3.3 (L) 09/09/2018 0757   CL 95 (L) 09/09/2018 0757   CO2 27 09/09/2018 0757   GLUCOSE 83 09/09/2018 0757   BUN 47 (H) 09/09/2018 0757   BUN 67 (H) 08/09/2018 1159   CREATININE 4.55 (H) 09/09/2018 0757   CALCIUM 8.1 (L) 09/09/2018 0757   CALCIUM 7.0 (L) 03/16/2014 0835   GFRNONAA 9 (L) 09/09/2018 0757   GFRAA 10 (L) 09/09/2018 0757    INR    Component Value Date/Time   INR 1.18 06/08/2018 0752     Intake/Output Summary (Last 24 hours) at 09/10/2018 0852 Last data filed at 09/09/2018 1111 Gross per 24 hour  Intake -  Output 1500 ml  Net -1500 ml     Assessment:  72 y.o. female is s/p:  Left transmetatarsal amputation  4 Days Post-Op  Plan: -wound healing will be tenuous.  Per Dr. Nicole Cella note yesterday, based on her previous arteriogram she has no options for revascularization. -pt remains afebrile -continue PT and ambulation with Darco shoe and heel weight bearing -social work working on SNF -pain medication not given as she received 120 tablets last month of Oxycodone  and Fentanyl patches last week.    Leontine Locket, PA-C [facility] [phone] 09/10/2018 8:52 AM  I have interviewed the patient and examined the patient. I agree with the findings by the PA.  Gae Gallop, MD [phone]

## 2018-09-10 NOTE — Discharge Summary (Addendum)
Discharge Summary    [name] 04-09-1946 72 y.o. female  [id number]  Admission Date: 09/06/2018  Discharge Date: 09/13/18  Physician: Angelia Mould, *  Admission Diagnosis: NONVIABLE TISSUE LEFT  FOOT   HPI:   This is a 72 y.o. female who underwent a right above-the-knee amputation on 06/08/2018 by Dr. [name].  The patient had previously been seen in consultation by Dr. Servando Snare with bilateral leg wounds.  Of note there were no revascularization options on the left based on the previous arteriogram.  Patient dialyzes on Tuesdays Thursdays and Saturdays.  I last saw the patient on 07/14/2018.  The right above-the-knee amputation site was healing nicely and the staples were removed in the office.  On the left side there is dry gangrene of the left forefoot with tibial artery occlusive disease.  Given that the patient had diabetes, end-stage renal disease, and tibial artery occlusive disease I felt that she would be a very high risk for not healing a transmetatarsal amputation.  I felt that the safest option at this progressed would be a left above-the-knee amputation.  She comes in today to discuss her options further.   Since I saw her last, she denies any fever or chills.  She does have some pain associated with the dry gangrene of her left forefoot.  She said no significant drainage from this area.  She has no complaints referrable to the right above-the-knee amputation.  She is requesting a new stump shrinker.  Hospital Course:  The patient was admitted to the hospital and taken to the operating room on 09/06/2018 and underwent: Left transmetatarsal amputation    Findings: There was marginal bleeding at the wound.  The pt tolerated the procedure well and was transported to the PACU in good condition.   On POD 1, dressing changes were started.  She will have to learn to use a Darco shoe for heel weight bearing.  She had dialysis on T/T/S while  hospitalized.  [place] nurse was consulted for stage 2 PI to left ischium.    A CIR consult was obtained and denied from [facility] for inpatient rehab.  Case manager consulted for SNF placement.  She is afebrile.  Her white blood cell count is 10.5.  Currently she is not and antibiotics.  If she continues to have drainage from the wound or this increases I think she should be started on po antibiotics.  Diabetic coordinator following pt.  Levemir discontinued and SSI changed to sensitive scale.  Her Eliquis was restarted postoperatively.   The remainder of the hospital course consisted of increasing mobilization and increasing intake of solids without difficulty.  She will follow up in office for a wound check in 2 weeks.  She will continue current wound care which includes the following twice daily: Soak the left transmetatarsal amputation site daily in lukewarm dial soap soaks, pack open part of TMA with moist 2x2 using the back of a Q-tip, wrap the foot with kerlix, wrap foot with 4 inch ACE.  Discharge instructions were reviewed with the patient and she voices her understanding.  She will continue Keflex 500mg  q 24h.  This prescription will be filled prior to discharge via [facility].  She will be discharged to SNF in stable condition.  CBC    Component Value Date/Time   WBC 11.3 (H) 09/09/2018 0757   RBC 2.79 (L) 09/09/2018 0757   HGB 7.6 (L) 09/09/2018 0757   HGB 8.4 (L) 07/17/2017 1025  HCT 26.1 (L) 09/09/2018 0757   HCT 24.9 (L) 12/23/2017 1527   PLT 261 09/09/2018 0757   MCV 93.5 09/09/2018 0757   MCH 27.2 09/09/2018 0757   MCHC 29.1 (L) 09/09/2018 0757   RDW 17.3 (H) 09/09/2018 0757   LYMPHSABS 0.4 (L) 07/29/2018 1211   MONOABS 0.7 07/29/2018 1211   EOSABS 0.1 07/29/2018 1211   BASOSABS 0.0 07/29/2018 1211    BMET    Component Value Date/Time   NA 135 09/09/2018 0757   NA 140 08/09/2018 1159   K 3.3 (L) 09/09/2018 0757   CL 95 (L) 09/09/2018 0757     CO2 27 09/09/2018 0757   GLUCOSE 83 09/09/2018 0757   BUN 47 (H) 09/09/2018 0757   BUN 67 (H) 08/09/2018 1159   CREATININE 4.55 (H) 09/09/2018 0757   CALCIUM 8.1 (L) 09/09/2018 0757   CALCIUM 7.0 (L) 03/16/2014 0835   GFRNONAA 9 (L) 09/09/2018 0757   GFRAA 10 (L) 09/09/2018 0757        Discharge Diagnosis:  NONVIABLE TISSUE LEFT  FOOT  Secondary Diagnosis: Patient Active Problem List   Diagnosis Date Noted  . Pressure injury of skin 09/09/2018  . S/P AKA (above knee amputation) unilateral, right ([place])   . Status post amputation of left foot through metatarsal bone ([place])   . Acute blood loss anemia   . Orthostasis   . Diabetes mellitus type 2 in nonobese (HCC)   . PAF (paroxysmal atrial fibrillation) ([place])   . PAD (peripheral artery disease) ([place]) 09/06/2018  . History of trichomonal vaginitis 08/18/2018  . Hypoglycemia 07/29/2018  . Trichomonal vaginitis 07/12/2018  . Atrial flutter ([place])   . SVT (supraventricular tachycardia) ([place])   . ESRD on dialysis ([place])   . Steroid-induced hyperglycemia   . Type II diabetes mellitus with peripheral autonomic neuropathy (HCC)   . Anemia of chronic disease   . Prediabetes   . Postoperative pain   . Ischemic leg pain   . Unilateral AKA, right ([place])   . Post-operative pain   . Diabetic retinopathy associated with type 2 diabetes mellitus ([place])   . Diabetic nephropathy associated with type 2 diabetes mellitus ([place])   . Leukocytosis   . Non-healing wound of lower extremity 06/08/2018  . Malnutrition of moderate degree 03/03/2018  . Leg wound, right 03/01/2018  . Ulcer of right calf ([place]) 02/10/2018  . Chronic RLQ pain 01/11/2018  . Ischemic ulcer of toes on both feet ([place])   . Cellulitis in diabetic foot ([place]) 12/14/2017  . Atrial fibrillation ([place]) 12/14/2017  . Glaucoma 12/14/2017  . ESRD (end stage renal disease) on dialysis ([place]) 11/16/2017  . COPD with acute exacerbation ([place]) 11/03/2017  . Community acquired pneumonia  11/02/2017  . Nausea and vomiting 11/02/2017  . Iron deficiency anemia 08/07/2017  . Heme positive stool 10/04/2015  . Mixed hyperlipidemia 06/29/2015  . Type 2 diabetes mellitus with stage 4 chronic kidney disease, with long-term current use of insulin ([place]) 06/29/2015  . Chronic kidney disease, stage V ([place]) 04/12/2014  . 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 Acute respiratory failure with hypoxia ([place]) 03/15/2014  . Chronic diastolic heart failure ([place]) 03/15/2014  . Anemia of chronic renal failure, stage 4 (severe) ([place]) 02/23/2014  . Folate deficiency 02/17/2014  . Aortic regurgitation 09/19/2012  . Class 2 severe obesity due to excess calories with serious comorbidity and body mass index (BMI) of 35.0 to 35.9 in adult ([place]) 09/14/2012  . Type 2  diabetes mellitus with diabetic neuropathy ([place]) 09/14/2012  . SLE (systemic lupus erythematosus) ([place]) 09/14/2012  . Essential hypertension, benign 09/14/2012  . COPD (chronic obstructive pulmonary disease) ([place]) 09/14/2012   Past Medical History:  Diagnosis Date  . Anemia   . Arthritis   . Asthma   . Atrial fibrillation ([place])   . Atrial flutter ([place])    a. s/p TEE/DCCV December 2013 b. 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 Bone spur of toe    Right 5th toe  . Chronic diastolic CHF (congestive heart failure) (HCC)    a. EF 50-55% by echo in 2015  . Colon polyposis   . COPD (chronic obstructive pulmonary disease) ([place])   . DVT of upper extremity (deep vein thrombosis) ([place])    Right basilic vein, June 4235  . ESRD (end stage renal disease) on dialysis ([place])    "TTF; [place]; off [employer]." (09/07/2018)  . Essential hypertension, benign   . Fractures   . Glaucoma   . Lupus ([place])   . Pericarditis    . SLE (systemic lupus erythematosus) ([place])   . Type 2 diabetes mellitus ([place])   . UTI (lower urinary tract infection)   . Wound of right leg 06/2017     Allergies as of 09/10/2018      Reactions   Ace Inhibitors Cough      Medication List    STOP taking these medications   Insulin Detemir 100 UNIT/ML Pen Commonly known as:  LEVEMIR FLEXTOUCH     TAKE these medications   acetaminophen 325 MG tablet Commonly known as:  TYLENOL Take 1-2 tablets (325-650 mg total) by mouth every 4 (four) hours as needed for mild pain. What changed:    how much to take  when to take this  reasons to take this   albuterol 108 (90 Base) MCG/ACT inhaler Commonly known as:  PROVENTIL HFA;VENTOLIN HFA Inhale 2 puffs into the lungs every 6 (six) hours as needed for shortness of breath.   allopurinol 100 MG tablet Commonly known as:  ZYLOPRIM Take 100 mg by mouth every Monday, Wednesday, and Friday.   amiodarone 200 MG tablet Commonly known as:  PACERONE Take 1 tablet (200 mg total) by mouth daily.   apixaban 5 MG Tabs tablet Commonly known as:  ELIQUIS Take 1 tablet (5 mg total) by mouth 2 (two) times daily.   atorvastatin 20 MG tablet Commonly known as:  LIPITOR Take 20 mg by mouth at bedtime.   calcium acetate 667 MG capsule Commonly known as:  PHOSLO Take 667 mg by mouth 3 (three) times daily with meals.   COMBIGAN 0.2-0.5 % ophthalmic solution Generic drug:  brimonidine-timolol Place 1 drop into the left eye 2 (two) times daily.   DIALYVITE 800 0.8 MG Tabs Take 1 tablet by mouth daily.   DUODERM CGF BORDER DRESSING EX Place 1 patch onto the skin daily. Applied to left hip   feeding supplement (PRO-STAT SUGAR FREE 64) Liqd Take 30 mLs by mouth 2 (two) times daily.   fentaNYL 50 MCG/HR Commonly known as:  DURAGESIC - dosed mcg/hr Place 25 mcg onto the skin every 3 (three) days.   folic acid 1 MG tablet Commonly known as:  FOLVITE Take 1 mg by mouth daily.   HUMALOG  KWIKPEN 100 UNIT/ML KiwkPen Generic drug:  insulin lispro  Inject 5 Units into the skin 3 (three) times daily with meals as needed (*Only take if blood sugar levels are over 100).   hydroxychloroquine 200 MG tablet Commonly known as:  PLAQUENIL Take 200 mg by mouth daily.   metoprolol tartrate 25 MG tablet Commonly known as:  LOPRESSOR Take 1/2 tablet twice a day on Mondays, Wednesdays and Fridays What changed:    how much to take  how to take this  when to take this  additional instructions   Oxycodone HCl 10 MG Tabs Take 10 mg by mouth every 6 (six) hours as needed (pain.).   pantoprazole 40 MG tablet Commonly known as:  PROTONIX Take 1 tablet (40 mg total) by mouth daily.   predniSONE 5 MG tablet Commonly known as:  DELTASONE Take 5 mg by mouth daily with breakfast.   travoprost (benzalkonium) 0.004 % ophthalmic solution Commonly known as:  TRAVATAN Place 1 drop into both eyes at bedtime.   UNIFINE PENTIPS 31G X 6 MM Misc Generic drug:  Insulin Pen Needle USE AS DIRECTED AT BEDTIME WITH LEVEMIR AND WITH SLIDING SCALE INSULIN.       Prescriptions given: None given.  Receives pain medication (Oxycodone and Fentanyl Patches) from  Dr. Gerarda Fraction and Dr. Collene Mares respectively.  Instructions: 1.  Heel weight bearing left foot with Darco shoe. 2.  Continue current wound care which includes the following twice daily: Soak the left transmetatarsal amputation site daily in lukewarm dial soap soaks, pack open part of TMA with moist 2x2 using the back of a Q-tip, wrap the foot with kerlix, wrap foot with 4 inch ACE.  Disposition: SNF  Patient's condition: fair  Follow up: 1. Dr. Scot Dock in 2 weeks   Dagoberto Ligas, PA-C [facility] [phone] 09/13/2018  12:05 PM

## 2018-09-10 NOTE — Progress Notes (Deleted)
Subjective:  No cos. Tolerated hd yest on schedule   Objective Vital signs in last 24 hours: Vitals:   09/09/18 1412 09/09/18 2118 09/10/18 0449 09/10/18 1333  BP: (!) 131/39 (!) 169/48 (!) 148/54 (!) 138/51  Pulse: 78 79 76 69  Resp: 17 18 20    Temp: 98.3 F (36.8 C) 98.5 F (36.9 C) 98.2 F (36.8 C) 98.6 F (37 C)  TempSrc: Oral Oral Oral Oral  SpO2: 99% 100% 99% 97%  Weight:       Weight change: -0.3 kg  Physical Exam: General: alert elderly female nad pleasant  Heart: RRR, no rub or mur Lungs: CTA bilat. Abdomen: Soft , Nt, ND Extremities: R AKA/ Left ft bandaged dry /clean / no pedal edema  Dialysis Access: R Perm cath    Dialysis Orders: TTS at [facility] 3:30hr, 400/800, EDW 62.5kg, 2K/2.5ca, TDC, heparin 2000 - Mircera 281mcg IV q 2 weeks (last 11/25)  Problem/Plan: 1. L toe dry gangrene (s/p L TMA 09/06/18): Severe PAD. Plans Per vascular surgery. 2. ESRD:Continue HD per usual TTS schedule, next12/14. Holding heparin. 3. Hypertension/volume:BP controlled, no edema on exam. Low UF goal. 4. Anemia:Hgb7.6.x2  Aranesp 200 given 12/10, follow. 5.  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 6. Nutrition:Continue protein supplements for wound healing. 7. Type 2 DM 8. A-fib (on Eliquis)= reg on exam   Ernest Haber, PA-C [place] [phone] 09/10/2018,2:30 PM  LOS: 4 days   Labs: Basic Metabolic Panel: Recent Labs  Lab 09/07/18 0251 09/08/18 0218 09/09/18 0757  NA 134* 134* 135  K 4.3 3.1* 3.3*  CL 98 97* 95*  CO2 22 26 27   GLUCOSE 139* 92 83  BUN 63* 23 47*  CREATININE 5.07* 2.68* 4.55*  CALCIUM 8.0* 8.0* 8.1*  PHOS  --   --  4.0   Liver Function Tests: Recent Labs  Lab 09/09/18 0757  ALBUMIN 1.6*   No results for input(s): LIPASE, AMYLASE in the last 168 hours. No results for input(s): AMMONIA in the last 168 hours. CBC: Recent Labs   Lab 09/06/18 0732 09/07/18 0251 09/08/18 0218 09/09/18 0757  WBC 7.0 9.8 10.5 11.3*  HGB 9.0* 8.5* 7.6* 7.6*  HCT 30.6* 29.2* 26.1* 26.1*  MCV 93.9 93.3 92.2 93.5  PLT 230 262 227 261   Cardiac Enzymes: No results for input(s): CKTOTAL, CKMB, CKMBINDEX, TROPONINI in the last 168 hours. CBG: Recent Labs  Lab 09/09/18 1233 09/09/18 1648 09/09/18 2202 09/10/18 0614 09/10/18 1108  GLUCAP 95 136* 138* 93 108*    Studies/Results: No results found. Medications: . sodium chloride     . allopurinol  100 mg Oral Q M,W,F  . amiodarone  200 mg Oral Daily  . apixaban  5 mg Oral BID  . atorvastatin  20 mg Oral QHS  . brimonidine  1 drop Left Eye BID   And  . timolol  1 drop Left Eye BID  . calcium acetate  667 mg Oral TID WC  . Chlorhexidine Gluconate Cloth  6 each Topical Q0600  . darbepoetin (ARANESP) injection - DIALYSIS  200 mcg Intravenous Q Tue-HD  . docusate sodium  100 mg Oral Daily  . feeding supplement (NEPRO CARB STEADY)  237 mL Oral BID BM  . feeding supplement (PRO-STAT SUGAR FREE 64)  30 mL Oral BID  . fentaNYL  25 mcg Transdermal Q72H  . folic acid  1 mg Oral Daily  . hydroxychloroquine  200 mg  Oral Daily  . insulin aspart  0-5 Units Subcutaneous QHS  . insulin aspart  0-9 Units Subcutaneous TID WC  . metoprolol tartrate  12.5 mg Oral 2 times per day on Mon Wed Fri  . multivitamin with minerals  1 tablet Oral Daily  . pantoprazole  40 mg Oral Daily  . predniSONE  5 mg Oral Q breakfast  . sodium chloride flush  3 mL Intravenous Q12H  . Travoprost (BAK Free)  1 drop Both Eyes QHS

## 2018-09-10 NOTE — Telephone Encounter (Signed)
-----   Message from Gabriel Earing, [place] sent at 09/10/2018  9:02 AM EST ----- S/p left TMA.  F/u with Dr. Scot Dock in 2 weeks.

## 2018-09-10 NOTE — Progress Notes (Signed)
Subjective:  No cos. Tolerated hd yest on schedule   Objective Vital signs in last 24 hours: Vitals:   09/09/18 1412 09/09/18 2118 09/10/18 0449 09/10/18 1333  BP: (!) 131/39 (!) 169/48 (!) 148/54 (!) 138/51  Pulse: 78 79 76 69  Resp: 17 18 20    Temp: 98.3 F (36.8 C) 98.5 F (36.9 C) 98.2 F (36.8 C) 98.6 F (37 C)  TempSrc: Oral Oral Oral Oral  SpO2: 99% 100% 99% 97%  Weight:       Weight change: -0.3 kg  Physical Exam: General: alert elderly female nad pleasant  Heart: RRR, no rub or mur Lungs: CTA bilat. Abdomen: Soft , Nt, ND Extremities: R AKA/ Left ft bandaged dry /clean / no pedal edema  Dialysis Access: R Perm cath    Dialysis Orders: TTS at [facility] 3:30hr, 400/800, EDW 62.5kg, 2K/2.5ca, TDC, heparin 2000 - Mircera 262mcg IV q 2 weeks (last 11/25)  Problem/Plan: 1. SP L TMA 09/06/18 (for gangrene): Severe PAD. Plans Per vascular surgery. 2. ESRD:Continue HD per usual TTS schedule, next12/14. Holding heparin. 3. Hypertension/volume:BP controlled, no edema on exam. Low UF goal. 4. Anemia:Hgb7.6.x2  Aranesp 200 given 12/10, follow. 5.  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 6. Nutrition:Continue protein supplements for wound healing. 7. Type 2 DM 8. A-fib (on Eliquis)= reg on exam   Ernest Haber, PA-C [place] [phone] 09/10/2018,3:11 PM  LOS: 4 days   Pt seen, examined and agree w A/P as above.  Kelly Splinter MD [facility] pager [phone]   09/10/2018, 3:11 PM    Labs: Basic Metabolic Panel: Recent Labs  Lab 09/07/18 0251 09/08/18 0218 09/09/18 0757  NA 134* 134* 135  K 4.3 3.1* 3.3*  CL 98 97* 95*  CO2 22 26 27   GLUCOSE 139* 92 83  BUN 63* 23 47*  CREATININE 5.07* 2.68* 4.55*  CALCIUM 8.0* 8.0* 8.1*  PHOS  --   --  4.0   Liver Function Tests: Recent Labs  Lab 09/09/18 0757  ALBUMIN 1.6*   No results  for input(s): LIPASE, AMYLASE in the last 168 hours. No results for input(s): AMMONIA in the last 168 hours. CBC: Recent Labs  Lab 09/06/18 0732 09/07/18 0251 09/08/18 0218 09/09/18 0757  WBC 7.0 9.8 10.5 11.3*  HGB 9.0* 8.5* 7.6* 7.6*  HCT 30.6* 29.2* 26.1* 26.1*  MCV 93.9 93.3 92.2 93.5  PLT 230 262 227 261   Cardiac Enzymes: No results for input(s): CKTOTAL, CKMB, CKMBINDEX, TROPONINI in the last 168 hours. CBG: Recent Labs  Lab 09/09/18 1233 09/09/18 1648 09/09/18 2202 09/10/18 0614 09/10/18 1108  GLUCAP 95 136* 138* 93 108*    Studies/Results: No results found. Medications: . sodium chloride     . allopurinol  100 mg Oral Q M,W,F  . amiodarone  200 mg Oral Daily  . apixaban  5 mg Oral BID  . atorvastatin  20 mg Oral QHS  . brimonidine  1 drop Left Eye BID   And  . timolol  1 drop Left Eye BID  . calcium acetate  667 mg Oral TID WC  . Chlorhexidine Gluconate Cloth  6 each Topical Q0600  . darbepoetin (ARANESP) injection - DIALYSIS  200 mcg Intravenous Q Tue-HD  . docusate sodium  100 mg Oral Daily  . feeding supplement (NEPRO CARB STEADY)  237 mL Oral BID BM  . feeding supplement (PRO-STAT SUGAR FREE 64)  30 mL Oral  BID  . fentaNYL  25 mcg Transdermal Q72H  . folic acid  1 mg Oral Daily  . hydroxychloroquine  200 mg Oral Daily  . insulin aspart  0-5 Units Subcutaneous QHS  . insulin aspart  0-9 Units Subcutaneous TID WC  . metoprolol tartrate  12.5 mg Oral 2 times per day on Mon Wed Fri  . multivitamin with minerals  1 tablet Oral Daily  . pantoprazole  40 mg Oral Daily  . predniSONE  5 mg Oral Q breakfast  . sodium chloride flush  3 mL Intravenous Q12H  . Travoprost (BAK Free)  1 drop Both Eyes QHS

## 2018-09-10 NOTE — Telephone Encounter (Signed)
sch appt spk to pt husband 09/24/18 330pm p/o PA

## 2018-09-10 NOTE — Progress Notes (Signed)
Physical Therapy Treatment Patient Details Name: [name] MRN: [id number] DOB: 12/22/1945 Today's Date: 09/10/2018    History of Present Illness 72 y.o. female who underwent a right above-the-knee amputation on 06/08/2018. Presents with dry gangrene of the left forefoot with tibial artery occlusive disease. S/p L transmetatarsal amputation. PMH includes diabetes, end-stage renal disease, and tibial artery occlusive disease with prior arteriogram.      PT Comments    Patient seen for mobility progression. Pt is pleasant and participatory. This session focused on functional transfers. Pt requires mod A +2 for slide board transfer EOB to drop arm recliner. Continue to progress as tolerated with anticipated d/c to SNF for further skilled PT services.     Follow Up Recommendations  SNF     Equipment Recommendations  Other (comment)(TBD at next venue)    Recommendations for Other Services       Precautions / Restrictions Precautions Precautions: Fall Required Braces or Orthoses: Other Brace(Darco shoe for weightbearing through heel) Restrictions Weight Bearing Restrictions: Yes LLE Weight Bearing: Partial weight bearing Other Position/Activity Restrictions: weightbearing through heel only    Mobility  Bed Mobility Overal bed mobility: Needs Assistance Bed Mobility: Supine to Sit     Supine to sit: Mod assist Sit to supine: Mod assist;+2 for physical assistance;Max assist   General bed mobility comments: modA for scooting hips to EoB due to increased L LE pain with movement  Transfers Overall transfer level: Needs assistance Equipment used: Sliding board Transfers: Lateral/Scoot Transfers          Lateral/Scoot Transfers: With slide board;Mod assist;+2 safety/equipment General transfer comment: cues for safe hand placement on slide board with transition and for sequencing; pt assisted as much as able and needs assistance for scooting hips EOB to drop arm  recliner  Ambulation/Gait             General Gait Details: unable to attempt    Stairs             Wheelchair Mobility    Modified Rankin (Stroke Patients Only)       Balance Overall balance assessment: Modified Independent Sitting-balance support: No upper extremity supported;Feet supported Sitting balance-Leahy Scale: Good                                      Cognition Arousal/Alertness: Awake/alert Behavior During Therapy: WFL for tasks assessed/performed Overall Cognitive Status: Within Functional Limits for tasks assessed                                 General Comments: slow to process when attempting mobility tasks      Exercises      General Comments        Pertinent Vitals/Pain Pain Assessment: Faces Faces Pain Scale: Hurts even more Pain Location: R side abdomen and L foot in dependent position Pain Descriptors / Indicators: Grimacing;Guarding;Throbbing;Aching Pain Intervention(s): Limited activity within patient's tolerance;Monitored during session;Repositioned    Home Living                      Prior Function            PT Goals (current goals can now be found in the care plan section) Progress towards PT goals: Progressing toward goals    Frequency    Min 3X/week  PT Plan Current plan remains appropriate    Co-evaluation              AM-PAC PT "6 Clicks" Mobility   Outcome Measure  Help needed turning from your back to your side while in a flat bed without using bedrails?: A Little Help needed moving from lying on your back to sitting on the side of a flat bed without using bedrails?: A Lot Help needed moving to and from a bed to a chair (including a wheelchair)?: A Lot Help needed standing up from a chair using your arms (e.g., wheelchair or bedside chair)?: Total Help needed to walk in hospital room?: Total Help needed climbing 3-5 steps with a railing? : Total 6  Click Score: 10    End of Session Equipment Utilized During Treatment: Gait belt Activity Tolerance: Patient tolerated treatment well Patient left: in chair;with chair alarm set;with call bell/phone within reach Nurse Communication: Mobility status PT Visit Diagnosis: Unsteadiness on feet (R26.81);Other abnormalities of gait and mobility (R26.89);Muscle weakness (generalized) (M62.81);Difficulty in walking, not elsewhere classified (R26.2);Pain Pain - Right/Left: Left Pain - part of body: Ankle and joints of foot     Time: 1509-1530 PT Time Calculation (min) (ACUTE ONLY): 21 min  Charges:  $Therapeutic Activity: 8-22 mins                     Earney Navy, PTA Acute Rehabilitation Services Pager: [phone] Office: [phone]     Darliss Cheney 09/10/2018, 4:45 PM

## 2018-09-11 LAB — RENAL FUNCTION PANEL
Albumin: 1.6 g/dL — ABNORMAL LOW (ref 3.5–5.0)
Anion gap: 12 (ref 5–15)
BUN: 32 mg/dL — AB (ref 8–23)
CO2: 27 mmol/L (ref 22–32)
Calcium: 8.4 mg/dL — ABNORMAL LOW (ref 8.9–10.3)
Chloride: 93 mmol/L — ABNORMAL LOW (ref 98–111)
Creatinine, Ser: 4.98 mg/dL — ABNORMAL HIGH (ref 0.44–1.00)
GFR calc Af Amer: 9 mL/min — ABNORMAL LOW (ref >60)
GFR, EST NON AFRICAN AMERICAN: 8 mL/min — AB (ref >60)
Glucose, Bld: 148 mg/dL — ABNORMAL HIGH (ref 70–99)
Phosphorus: 1.8 mg/dL — ABNORMAL LOW (ref 2.5–4.6)
Potassium: 4.6 mmol/L (ref 3.5–5.1)
Sodium: 132 mmol/L — ABNORMAL LOW (ref 135–145)

## 2018-09-11 LAB — CBC
HCT: 26.3 % — ABNORMAL LOW (ref 36.0–46.0)
Hemoglobin: 7.5 g/dL — ABNORMAL LOW (ref 12.0–15.0)
MCH: 26.9 pg (ref 26.0–34.0)
MCHC: 28.5 g/dL — ABNORMAL LOW (ref 30.0–36.0)
MCV: 94.3 fL (ref 80.0–100.0)
NRBC: 0 % (ref 0.0–0.2)
PLATELETS: 288 10*3/uL (ref 150–400)
RBC: 2.79 MIL/uL — ABNORMAL LOW (ref 3.87–5.11)
RDW: 17.1 % — AB (ref 11.5–15.5)
WBC: 9.1 10*3/uL (ref 4.0–10.5)

## 2018-09-11 LAB — GLUCOSE, CAPILLARY
Glucose-Capillary: 115 mg/dL — ABNORMAL HIGH (ref 70–99)
Glucose-Capillary: 174 mg/dL — ABNORMAL HIGH (ref 70–99)
Glucose-Capillary: 173 mg/dL — ABNORMAL HIGH (ref 70–99)

## 2018-09-11 MED ORDER — MORPHINE SULFATE (PF) 2 MG/ML IV SOLN
INTRAVENOUS | Status: AC
  Filled 2018-09-11: qty 1

## 2018-09-11 MED ORDER — CEPHALEXIN 250 MG PO CAPS
250.0000 mg | ORAL_CAPSULE | Freq: Three times a day (TID) | ORAL | Status: DC
  Administered 2018-09-11 (×2): 250 mg via ORAL
  Filled 2018-09-11 (×4): qty 1

## 2018-09-11 MED ORDER — HEPARIN SODIUM (PORCINE) 1000 UNIT/ML IJ SOLN
INTRAMUSCULAR | Status: AC
  Administered 2018-09-11: 17:00:00
  Filled 2018-09-11: qty 4

## 2018-09-11 NOTE — Progress Notes (Addendum)
Subjective:   Mild foot discomfort , for hd today   Objective Vital signs in last 24 hours: Vitals:   09/10/18 1333 09/10/18 1800 09/10/18 2011 09/11/18 0505  BP: (!) 138/51  (!) 180/49 (!) 165/59  Pulse: 69  67 68  Resp:   14 14  Temp: 98.6 F (37 C)  98.5 F (36.9 C) 98.1 F (36.7 C)  TempSrc: Oral  Oral Rectal  SpO2: 97%  100% 93%  Weight:  62.7 kg    Height:  5' (1.524 m)     Weight change: 1 kg  Physical Exam: General: alert, elderly female nad pleasant  Heart: RRR, no rub or mur Lungs: CTA bilat. Abdomen: Soft , Nt, ND Extremities: R AKA/ Left ft bandaged dry /clean / no pedal edema  Dialysis Access: R Perm cath    Dialysis Orders: TTS at [facility] 3:30hr, 400/800, EDW 62.5kg, 2K/2.5ca, TDC, heparin 2000 - Mircera 233mcg IV q 2 weeks (last 11/25)  Problem/Plan: 1. SP L TMA 09/06/18 (for gangrene): Severe PAD. Plans as  Per vascular surgery. 2. ESRD:Continue HD per usual TTS schedule, nexttoday.Holdingheparin. 3. Hypertension/volume:BP slightly  up today with  HD pending , no edema on exam. Low UF goal./ on Metop.12.5mg  bid  MWF 4. Anemia:Hgb7.6.x2  Aranesp 200 given 12/10, follow  hgb  5. 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 6. Nutrition:Continueprotein supplements for wound healing. 7. Type 2 DM 8. A-fib (on Eliquis)= reg on exam    Ernest Haber, PA-C [place] [phone] 09/11/2018,12:22 PM  LOS: 5 days   Pt seen, examined and agree w A/P as above.  Kelly Splinter MD [facility] pager [phone]   09/11/2018, 3:17 PM    Labs: Basic Metabolic Panel: Recent Labs  Lab 09/07/18 0251 09/08/18 0218 09/09/18 0757  NA 134* 134* 135  K 4.3 3.1* 3.3*  CL 98 97* 95*  CO2 22 26 27   GLUCOSE 139* 92 83  BUN 63* 23 47*  CREATININE 5.07* 2.68* 4.55*  CALCIUM 8.0* 8.0* 8.1*  PHOS  --   --  4.0   Liver Function  Tests: Recent Labs  Lab 09/09/18 0757  ALBUMIN 1.6*   No results for input(s): LIPASE, AMYLASE in the last 168 hours. No results for input(s): AMMONIA in the last 168 hours. CBC: Recent Labs  Lab 09/06/18 0732 09/07/18 0251 09/08/18 0218 09/09/18 0757  WBC 7.0 9.8 10.5 11.3*  HGB 9.0* 8.5* 7.6* 7.6*  HCT 30.6* 29.2* 26.1* 26.1*  MCV 93.9 93.3 92.2 93.5  PLT 230 262 227 261   Cardiac Enzymes: No results for input(s): CKTOTAL, CKMB, CKMBINDEX, TROPONINI in the last 168 hours. CBG: Recent Labs  Lab 09/10/18 1108 09/10/18 1631 09/10/18 2223 09/11/18 0635 09/11/18 1144  GLUCAP 108* 130* 159* 115* 174*    Studies/Results: No results found. Medications: . sodium chloride     . allopurinol  100 mg Oral Q M,W,F  . amiodarone  200 mg Oral Daily  . apixaban  5 mg Oral BID  . atorvastatin  20 mg Oral QHS  . brimonidine  1 drop Left Eye BID   And  . timolol  1 drop Left Eye BID  . calcium acetate  667 mg Oral TID WC  . cephALEXin  250 mg Oral Q8H  . Chlorhexidine Gluconate Cloth  6 each Topical Q0600  . darbepoetin (ARANESP) injection - DIALYSIS  200 mcg Intravenous Q Tue-HD  . docusate sodium  100 mg Oral Daily  . feeding supplement (NEPRO CARB STEADY)  237 mL Oral BID BM  . feeding supplement (PRO-STAT SUGAR FREE 64)  30 mL Oral BID  . fentaNYL  25 mcg Transdermal Q72H  . folic acid  1 mg Oral Daily  . hydroxychloroquine  200 mg Oral Daily  . insulin aspart  0-5 Units Subcutaneous QHS  . insulin aspart  0-9 Units Subcutaneous TID WC  . metoprolol tartrate  12.5 mg Oral 2 times per day on Mon Wed Fri  . multivitamin with minerals  1 tablet Oral Daily  . pantoprazole  40 mg Oral Daily  . predniSONE  5 mg Oral Q breakfast  . sodium chloride flush  3 mL Intravenous Q12H  . Travoprost (BAK Free)  1 drop Both Eyes QHS

## 2018-09-11 NOTE — Progress Notes (Signed)
   VASCULAR SURGERY ASSESSMENT & PLAN:   5 Days Post-Op s/p: Left TMA. Some drainage from TMA. She is not out of the woods yet. 50-50 chance of healing. No options for revascularization.  Will have nurses soak foot daily in [place] warm soapy water.   Needs SNF.   On Eliquis for A. Fib.  Afeb, but WBC has increased to 11.3. Will add Keflex.   ESRD: HD - TTS  SUBJECTIVE:   Pain controlled.   PHYSICAL EXAM:   Vitals:   09/10/18 1333 09/10/18 1800 09/10/18 2011 09/11/18 0505  BP: (!) 138/51  (!) 180/49 (!) 165/59  Pulse: 69  67 68  Resp:   14 14  Temp: 98.6 F (37 C)  98.5 F (36.9 C) 98.1 F (36.7 C)  TempSrc: Oral  Oral Rectal  SpO2: 97%  100% 93%  Weight:  62.7 kg    Height:  5' (1.524 m)     Dressing changed. Some drainage.   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 allopurinol  100 mg Oral Q M,W,F  . amiodarone  200 mg Oral Daily  . apixaban  5 mg Oral BID  . atorvastatin  20 mg Oral QHS  . brimonidine  1 drop Left Eye BID   And  . timolol  1 drop Left Eye BID  . calcium acetate  667 mg Oral TID WC  . Chlorhexidine Gluconate Cloth  6 each Topical Q0600  . darbepoetin (ARANESP) injection - DIALYSIS  200 mcg Intravenous Q Tue-HD  . docusate sodium  100 mg Oral Daily  . feeding supplement (NEPRO CARB STEADY)  237 mL Oral BID BM  . feeding supplement  (PRO-STAT SUGAR FREE 64)  30 mL Oral BID  . fentaNYL  25 mcg Transdermal Q72H  . folic acid  1 mg Oral Daily  . hydroxychloroquine  200 mg Oral Daily  . insulin aspart  0-5 Units Subcutaneous QHS  . insulin aspart  0-9 Units Subcutaneous TID WC  . metoprolol tartrate  12.5 mg Oral 2 times per day on Mon Wed Fri  . multivitamin with minerals  1 tablet Oral Daily  . pantoprazole  40 mg Oral Daily  . predniSONE  5 mg Oral Q breakfast  . sodium chloride flush  3 mL Intravenous Q12H  . Travoprost (BAK Free)  1 drop Both Eyes QHS    [name] Beeper: [phone] Office: [phone] 09/11/2018

## 2018-09-12 ENCOUNTER — Encounter (HOSPITAL_COMMUNITY): Payer: Self-pay | Admitting: *Deleted

## 2018-09-12 LAB — CBC
HCT: 26.9 % — ABNORMAL LOW (ref 36.0–46.0)
Hemoglobin: 7.8 g/dL — ABNORMAL LOW (ref 12.0–15.0)
MCH: 27 pg (ref 26.0–34.0)
MCHC: 29 g/dL — ABNORMAL LOW (ref 30.0–36.0)
MCV: 93.1 fL (ref 80.0–100.0)
Platelets: 282 10*3/uL (ref 150–400)
RBC: 2.89 MIL/uL — ABNORMAL LOW (ref 3.87–5.11)
RDW: 17 % — ABNORMAL HIGH (ref 11.5–15.5)
WBC: 7.8 10*3/uL (ref 4.0–10.5)
nRBC: 0 % (ref 0.0–0.2)

## 2018-09-12 LAB — GLUCOSE, CAPILLARY
Glucose-Capillary: 130 mg/dL — ABNORMAL HIGH (ref 70–99)
Glucose-Capillary: 212 mg/dL — ABNORMAL HIGH (ref 70–99)

## 2018-09-12 MED ORDER — K PHOS MONO-SOD PHOS DI & MONO 155-852-130 MG PO TABS
250.0000 mg | ORAL_TABLET | Freq: Once | ORAL | Status: AC
  Administered 2018-09-12: 250 mg via ORAL
  Filled 2018-09-12: qty 1

## 2018-09-12 MED ORDER — POLYETHYLENE GLYCOL 3350 17 G PO PACK
17.0000 g | PACK | Freq: Every day | ORAL | Status: DC
  Administered 2018-09-12 – 2018-09-13 (×2): 17 g via ORAL
  Filled 2018-09-12 (×2): qty 1

## 2018-09-12 MED ORDER — POLYETHYLENE GLYCOL 3350 17 G PO PACK: 17.0000 g | PACK | Freq: Every day | ORAL | Status: DC | PRN

## 2018-09-12 MED ORDER — CEPHALEXIN 500 MG PO CAPS
500.0000 mg | ORAL_CAPSULE | ORAL | Status: DC
  Administered 2018-09-12: 500 mg via ORAL
  Filled 2018-09-12: qty 1

## 2018-09-12 NOTE — Progress Notes (Signed)
Dressing change to pt's Left TMA completed per MD orders. Attempts to educate pt and spouse on dsg change and wound care did not go over well. Husband asked if he was going to have to do that, or if home health would come out everyday to do it. He was not comfortable with looking at the wound and neither was the pt. Treatment completed as ordered. Re-dressed. Pt continues to complain of severe pain to foot, especially the bottom of the foot. Medicated prior to and after dressing change

## 2018-09-12 NOTE — Progress Notes (Signed)
VASCULAR SURGERY ASSESSMENT & PLAN:   6 Days Post-Op s/p: Left transmetatarsal amputation.  I started Keflex yesterday (500 mg every 24 hours) given her mildly elevated white blood cell count with some persistent drainage.  Today would be day 2 of po antibiotics.  This morning I removed 2 sutures and packed to the wound with a moist 2 x 2.  I have previously written orders to soak the foot daily in lukewarm dial soap soaks and to pack the wound it does not look like this is being done.  I will reinforce with the nurses the importance of aggressive wound care.  On Eliquis for A. Fib.  END-STAGE RENAL DISEASE: She dialyzes on Tuesdays Thursdays and Saturdays.  She is awaiting a bed at a skilled nursing facility.   SUBJECTIVE:   Pain adequately controlled.  PHYSICAL EXAM:   Vitals:   09/11/18 1600 09/11/18 1620 09/11/18 2031 09/12/18 0428  BP: (!) 129/56 (!) 148/74 (!) 144/51 (!) 144/41  Pulse: 77 77 71 61  Resp:  17 18   Temp:  98.4 F (36.9 C) 98.7 F (37.1 C) 98.4 F (36.9 C)  TempSrc:  Oral Oral Oral  SpO2: 97% 98% 93%   Weight:  61 kg    Height:       She has a small amount of drainage from her left transmetatarsal amputation.  I removed 2 sutures and packed this with a 2 x 2.  The TMA does appear adequately perfused.  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 allopurinol  100 mg Oral Q M,W,F  . amiodarone  200 mg Oral Daily  . apixaban  5 mg Oral BID  . atorvastatin  20 mg Oral QHS  . brimonidine  1 drop Left Eye BID   And  . timolol  1 drop Left Eye BID  . calcium acetate  667 mg Oral TID WC  . cephALEXin  500 mg Oral Q24H  . Chlorhexidine Gluconate Cloth  6 each Topical Q0600  . darbepoetin (ARANESP) injection - DIALYSIS  200 mcg Intravenous Q Tue-HD  . docusate sodium  100 mg Oral Daily  . feeding supplement (NEPRO CARB STEADY)  237 mL Oral BID BM  . feeding supplement (PRO-STAT SUGAR FREE 64)  30 mL Oral BID  . fentaNYL  25 mcg Transdermal Q72H  . folic acid  1 mg Oral Daily  . hydroxychloroquine  200 mg Oral Daily  . insulin aspart  0-5 Units Subcutaneous QHS  . insulin aspart  0-9 Units Subcutaneous TID WC  . metoprolol tartrate  12.5 mg Oral 2 times per day on Mon Wed Fri  . multivitamin with minerals  1 tablet Oral Daily  . pantoprazole  40 mg Oral Daily  . predniSONE  5 mg Oral Q breakfast  . sodium chloride flush  3 mL Intravenous Q12H  . Travoprost (BAK Free)  1 drop Both Eyes QHS    [name] Beeper: [phone] Office: [phone] 09/12/2018

## 2018-09-12 NOTE — Progress Notes (Addendum)
Subjective:   Tolerated Hd yesterday , reports mild constipation , per RN Stool yest , add miralax to colace  Objective Vital signs in last 24 hours: Vitals:   09/11/18 1600 09/11/18 1620 09/11/18 2031 09/12/18 0428  BP: (!) 129/56 (!) 148/74 (!) 144/51 (!) 144/41  Pulse: 77 77 71 61  Resp:  17 18   Temp:  98.4 F (36.9 C) 98.7 F (37.1 C) 98.4 F (36.9 C)  TempSrc:  Oral Oral Oral  SpO2: 97% 98% 93%   Weight:  61 kg    Height:       Weight change: -0.3 kg  Physical Exam: General: alert, elderly female nad pleasant  Heart: RRR, no rub or mur Lungs: CTA bilat. Abdomen: Soft , Nt, ND Extremities: R AKA/ Left ft bandaged dry /clean / no pedal edema  Dialysis Access: R Perm cath   Dialysis Orders: TTS at [facility] 3:30hr, 400/800, EDW 62.5kg, 2K/2.5ca, TDC, heparin 2000 - Mircera 239mcg IV q 2 weeks (last 11/25)  Problem/Plan: 1. SPL TMA 09/06/18 (for gangrene): Severe PAD. Plans as  Per vascular surgery. 2. ESRD:Continue HD per usual TTS schedule.Holdingheparin past week ,should be ok on 2000 units next week start back hep with HD 3. Hypertension/volume:BP improved with HD uf 1.5 l yest on hd   , no edema on exam. Low UF goal./ on Metop.12.5mg  bid  MWF 4. Anemia:Hgb7.6.x2 > 7.8 this am Aranesp 200 given 12/10, follow  hgb trend 5. 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 6. Nutrition:Continueprotein supplements for wound healing. 7. Type 2 DM 8. A-fib (on Eliquis)= reg on exam  9. Dispo - awaiting SNF placement   Ernest Haber, PA-C [place] [phone] 09/12/2018,11:43 AM  LOS: 6 days   Pt seen, examined and agree w A/P as above.  Kelly Splinter MD [facility] pager [phone]   09/12/2018, 3:09 PM    Labs: Basic Metabolic Panel: Recent Labs  Lab 09/08/18 0218 09/09/18 0757 09/11/18 1250   NA 134* 135 132*  K 3.1* 3.3* 4.6  CL 97* 95* 93*  CO2 26 27 27   GLUCOSE 92 83 148*  BUN 23 47* 32*  CREATININE 2.68* 4.55* 4.98*  CALCIUM 8.0* 8.1* 8.4*  PHOS  --  4.0 1.8*   Liver Function Tests: Recent Labs  Lab 09/09/18 0757 09/11/18 1250  ALBUMIN 1.6* 1.6*   No results for input(s): LIPASE, AMYLASE in the last 168 hours. No results for input(s): AMMONIA in the last 168 hours. CBC: Recent Labs  Lab 09/07/18 0251 09/08/18 0218 09/09/18 0757 09/11/18 1250 09/12/18 0705  WBC 9.8 10.5 11.3* 9.1 7.8  HGB 8.5* 7.6* 7.6* 7.5* 7.8*  HCT 29.2* 26.1* 26.1* 26.3* 26.9*  MCV 93.3 92.2 93.5 94.3 93.1  PLT 262 227 261 288 282   Cardiac Enzymes: No results for input(s): CKTOTAL, CKMB, CKMBINDEX, TROPONINI in the last 168 hours. CBG: Recent Labs  Lab 09/10/18 2223 09/11/18 0635 09/11/18 1144 09/11/18 2112 09/12/18 0612  GLUCAP 159* 115* 174* 173* 130*    Studies/Results: No results found. Medications: . sodium chloride     . allopurinol  100 mg Oral Q M,W,F  . amiodarone  200 mg Oral Daily  . apixaban  5 mg Oral BID  . atorvastatin  20 mg Oral QHS  . brimonidine  1 drop Left Eye BID   And  .  timolol  1 drop Left Eye BID  . calcium acetate  667 mg Oral TID WC  . cephALEXin  500 mg Oral Q24H  . Chlorhexidine Gluconate Cloth  6 each Topical Q0600  . darbepoetin (ARANESP) injection - DIALYSIS  200 mcg Intravenous Q Tue-HD  . docusate sodium  100 mg Oral Daily  . feeding supplement (NEPRO CARB STEADY)  237 mL Oral BID BM  . feeding supplement (PRO-STAT SUGAR FREE 64)  30 mL Oral BID  . fentaNYL  25 mcg Transdermal Q72H  . folic acid  1 mg Oral Daily  . hydroxychloroquine  200 mg Oral Daily  . insulin aspart  0-5 Units Subcutaneous QHS  . insulin aspart  0-9 Units Subcutaneous TID WC  . metoprolol tartrate  12.5 mg Oral 2 times per day on Mon Wed Fri  . multivitamin with minerals  1 tablet Oral Daily  . pantoprazole  40 mg Oral Daily  . predniSONE  5 mg Oral Q  breakfast  . sodium chloride flush  3 mL Intravenous Q12H  . Travoprost (BAK Free)  1 drop Both Eyes QHS

## 2018-09-12 NOTE — Plan of Care (Signed)
  Problem: Education: Goal: Knowledge of General Education information will improve Description: Including pain rating scale, medication(s)/side effects and non-pharmacologic comfort measures Outcome: Progressing   Problem: Nutrition: Goal: Adequate nutrition will be maintained Outcome: Progressing   Problem: Safety: Goal: Ability to remain free from injury will improve Outcome: Progressing   

## 2018-09-13 DIAGNOSIS — N186 End stage renal disease: Secondary | ICD-10-CM | POA: Diagnosis not present

## 2018-09-13 DIAGNOSIS — I120 Hypertensive chronic kidney disease with stage 5 chronic kidney disease or end stage renal disease: Secondary | ICD-10-CM | POA: Diagnosis not present

## 2018-09-13 DIAGNOSIS — R0789 Other chest pain: Secondary | ICD-10-CM | POA: Diagnosis not present

## 2018-09-13 DIAGNOSIS — E1140 Type 2 diabetes mellitus with diabetic neuropathy, unspecified: Secondary | ICD-10-CM | POA: Diagnosis not present

## 2018-09-13 DIAGNOSIS — I132 Hypertensive heart and chronic kidney disease with heart failure and with stage 5 chronic kidney disease, or end stage renal disease: Secondary | ICD-10-CM | POA: Diagnosis not present

## 2018-09-13 DIAGNOSIS — H409 Unspecified glaucoma: Secondary | ICD-10-CM | POA: Diagnosis not present

## 2018-09-13 DIAGNOSIS — D72829 Elevated white blood cell count, unspecified: Secondary | ICD-10-CM | POA: Diagnosis not present

## 2018-09-13 DIAGNOSIS — Z89432 Acquired absence of left foot: Secondary | ICD-10-CM | POA: Diagnosis not present

## 2018-09-13 DIAGNOSIS — Z89612 Acquired absence of left leg above knee: Secondary | ICD-10-CM | POA: Diagnosis not present

## 2018-09-13 DIAGNOSIS — I10 Essential (primary) hypertension: Secondary | ICD-10-CM | POA: Diagnosis not present

## 2018-09-13 DIAGNOSIS — Z4781 Encounter for orthopedic aftercare following surgical amputation: Secondary | ICD-10-CM | POA: Diagnosis not present

## 2018-09-13 DIAGNOSIS — D529 Folate deficiency anemia, unspecified: Secondary | ICD-10-CM | POA: Diagnosis not present

## 2018-09-13 DIAGNOSIS — T8142XA Infection following a procedure, deep incisional surgical site, initial encounter: Secondary | ICD-10-CM | POA: Diagnosis not present

## 2018-09-13 DIAGNOSIS — Y95 Nosocomial condition: Secondary | ICD-10-CM | POA: Diagnosis not present

## 2018-09-13 DIAGNOSIS — E161 Other hypoglycemia: Secondary | ICD-10-CM | POA: Diagnosis not present

## 2018-09-13 DIAGNOSIS — R279 Unspecified lack of coordination: Secondary | ICD-10-CM | POA: Diagnosis not present

## 2018-09-13 DIAGNOSIS — N2581 Secondary hyperparathyroidism of renal origin: Secondary | ICD-10-CM | POA: Diagnosis not present

## 2018-09-13 DIAGNOSIS — Y835 Amputation of limb(s) as the cause of abnormal reaction of the patient, or of later complication, without mention of misadventure at the time of the procedure: Secondary | ICD-10-CM | POA: Diagnosis present

## 2018-09-13 DIAGNOSIS — J849 Interstitial pulmonary disease, unspecified: Secondary | ICD-10-CM | POA: Diagnosis not present

## 2018-09-13 DIAGNOSIS — M6281 Muscle weakness (generalized): Secondary | ICD-10-CM | POA: Diagnosis not present

## 2018-09-13 DIAGNOSIS — I4892 Unspecified atrial flutter: Secondary | ICD-10-CM | POA: Diagnosis not present

## 2018-09-13 DIAGNOSIS — L97503 Non-pressure chronic ulcer of other part of unspecified foot with necrosis of muscle: Secondary | ICD-10-CM | POA: Diagnosis not present

## 2018-09-13 DIAGNOSIS — J439 Emphysema, unspecified: Secondary | ICD-10-CM | POA: Diagnosis not present

## 2018-09-13 DIAGNOSIS — L89153 Pressure ulcer of sacral region, stage 3: Secondary | ICD-10-CM | POA: Diagnosis not present

## 2018-09-13 DIAGNOSIS — E876 Hypokalemia: Secondary | ICD-10-CM | POA: Diagnosis not present

## 2018-09-13 DIAGNOSIS — L03116 Cellulitis of left lower limb: Secondary | ICD-10-CM | POA: Diagnosis not present

## 2018-09-13 DIAGNOSIS — E43 Unspecified severe protein-calorie malnutrition: Secondary | ICD-10-CM | POA: Diagnosis not present

## 2018-09-13 DIAGNOSIS — A419 Sepsis, unspecified organism: Secondary | ICD-10-CM | POA: Diagnosis not present

## 2018-09-13 DIAGNOSIS — Z515 Encounter for palliative care: Secondary | ICD-10-CM | POA: Diagnosis not present

## 2018-09-13 DIAGNOSIS — K922 Gastrointestinal hemorrhage, unspecified: Secondary | ICD-10-CM | POA: Diagnosis not present

## 2018-09-13 DIAGNOSIS — E785 Hyperlipidemia, unspecified: Secondary | ICD-10-CM | POA: Diagnosis not present

## 2018-09-13 DIAGNOSIS — R0902 Hypoxemia: Secondary | ICD-10-CM | POA: Diagnosis not present

## 2018-09-13 DIAGNOSIS — D649 Anemia, unspecified: Secondary | ICD-10-CM | POA: Diagnosis not present

## 2018-09-13 DIAGNOSIS — I5032 Chronic diastolic (congestive) heart failure: Secondary | ICD-10-CM | POA: Diagnosis not present

## 2018-09-13 DIAGNOSIS — J4520 Mild intermittent asthma, uncomplicated: Secondary | ICD-10-CM | POA: Diagnosis not present

## 2018-09-13 DIAGNOSIS — D631 Anemia in chronic kidney disease: Secondary | ICD-10-CM | POA: Diagnosis not present

## 2018-09-13 DIAGNOSIS — Z1612 Extended spectrum beta lactamase (ESBL) resistance: Secondary | ICD-10-CM | POA: Diagnosis not present

## 2018-09-13 DIAGNOSIS — J449 Chronic obstructive pulmonary disease, unspecified: Secondary | ICD-10-CM | POA: Diagnosis not present

## 2018-09-13 DIAGNOSIS — E1151 Type 2 diabetes mellitus with diabetic peripheral angiopathy without gangrene: Secondary | ICD-10-CM | POA: Diagnosis not present

## 2018-09-13 DIAGNOSIS — R05 Cough: Secondary | ICD-10-CM | POA: Diagnosis not present

## 2018-09-13 DIAGNOSIS — Z992 Dependence on renal dialysis: Secondary | ICD-10-CM | POA: Diagnosis not present

## 2018-09-13 DIAGNOSIS — Z7189 Other specified counseling: Secondary | ICD-10-CM | POA: Diagnosis not present

## 2018-09-13 DIAGNOSIS — M329 Systemic lupus erythematosus, unspecified: Secondary | ICD-10-CM | POA: Diagnosis not present

## 2018-09-13 DIAGNOSIS — E11319 Type 2 diabetes mellitus with unspecified diabetic retinopathy without macular edema: Secondary | ICD-10-CM | POA: Diagnosis not present

## 2018-09-13 DIAGNOSIS — Y828 Other medical devices associated with adverse incidents: Secondary | ICD-10-CM | POA: Diagnosis not present

## 2018-09-13 DIAGNOSIS — R197 Diarrhea, unspecified: Secondary | ICD-10-CM | POA: Diagnosis not present

## 2018-09-13 DIAGNOSIS — L089 Local infection of the skin and subcutaneous tissue, unspecified: Secondary | ICD-10-CM | POA: Diagnosis not present

## 2018-09-13 DIAGNOSIS — Z9911 Dependence on respirator [ventilator] status: Secondary | ICD-10-CM | POA: Diagnosis not present

## 2018-09-13 DIAGNOSIS — R627 Adult failure to thrive: Secondary | ICD-10-CM | POA: Diagnosis not present

## 2018-09-13 DIAGNOSIS — G9341 Metabolic encephalopathy: Secondary | ICD-10-CM | POA: Diagnosis not present

## 2018-09-13 DIAGNOSIS — I959 Hypotension, unspecified: Secondary | ICD-10-CM | POA: Diagnosis not present

## 2018-09-13 DIAGNOSIS — D62 Acute posthemorrhagic anemia: Secondary | ICD-10-CM | POA: Diagnosis not present

## 2018-09-13 DIAGNOSIS — E1129 Type 2 diabetes mellitus with other diabetic kidney complication: Secondary | ICD-10-CM | POA: Diagnosis not present

## 2018-09-13 DIAGNOSIS — R509 Fever, unspecified: Secondary | ICD-10-CM | POA: Diagnosis not present

## 2018-09-13 DIAGNOSIS — A4151 Sepsis due to Escherichia coli [E. coli]: Secondary | ICD-10-CM | POA: Diagnosis not present

## 2018-09-13 DIAGNOSIS — J9601 Acute respiratory failure with hypoxia: Secondary | ICD-10-CM | POA: Diagnosis not present

## 2018-09-13 DIAGNOSIS — E782 Mixed hyperlipidemia: Secondary | ICD-10-CM | POA: Diagnosis not present

## 2018-09-13 DIAGNOSIS — R1310 Dysphagia, unspecified: Secondary | ICD-10-CM | POA: Diagnosis not present

## 2018-09-13 DIAGNOSIS — I5033 Acute on chronic diastolic (congestive) heart failure: Secondary | ICD-10-CM | POA: Diagnosis not present

## 2018-09-13 DIAGNOSIS — K219 Gastro-esophageal reflux disease without esophagitis: Secondary | ICD-10-CM | POA: Diagnosis not present

## 2018-09-13 DIAGNOSIS — N179 Acute kidney failure, unspecified: Secondary | ICD-10-CM | POA: Diagnosis not present

## 2018-09-13 DIAGNOSIS — T8130XA Disruption of wound, unspecified, initial encounter: Secondary | ICD-10-CM | POA: Diagnosis not present

## 2018-09-13 DIAGNOSIS — M109 Gout, unspecified: Secondary | ICD-10-CM | POA: Diagnosis not present

## 2018-09-13 DIAGNOSIS — Z888 Allergy status to other drugs, medicaments and biological substances status: Secondary | ICD-10-CM | POA: Diagnosis not present

## 2018-09-13 DIAGNOSIS — Z86718 Personal history of other venous thrombosis and embolism: Secondary | ICD-10-CM | POA: Diagnosis not present

## 2018-09-13 DIAGNOSIS — J155 Pneumonia due to Escherichia coli: Secondary | ICD-10-CM | POA: Diagnosis not present

## 2018-09-13 DIAGNOSIS — Z89611 Acquired absence of right leg above knee: Secondary | ICD-10-CM | POA: Diagnosis not present

## 2018-09-13 DIAGNOSIS — I471 Supraventricular tachycardia: Secondary | ICD-10-CM | POA: Diagnosis not present

## 2018-09-13 DIAGNOSIS — E440 Moderate protein-calorie malnutrition: Secondary | ICD-10-CM | POA: Diagnosis not present

## 2018-09-13 DIAGNOSIS — M2550 Pain in unspecified joint: Secondary | ICD-10-CM | POA: Diagnosis not present

## 2018-09-13 DIAGNOSIS — Z7401 Bed confinement status: Secondary | ICD-10-CM | POA: Diagnosis not present

## 2018-09-13 DIAGNOSIS — Y92239 Unspecified place in hospital as the place of occurrence of the external cause: Secondary | ICD-10-CM | POA: Diagnosis not present

## 2018-09-13 DIAGNOSIS — D689 Coagulation defect, unspecified: Secondary | ICD-10-CM | POA: Diagnosis not present

## 2018-09-13 DIAGNOSIS — R52 Pain, unspecified: Secondary | ICD-10-CM | POA: Diagnosis not present

## 2018-09-13 DIAGNOSIS — I480 Paroxysmal atrial fibrillation: Secondary | ICD-10-CM | POA: Diagnosis not present

## 2018-09-13 DIAGNOSIS — J9691 Respiratory failure, unspecified with hypoxia: Secondary | ICD-10-CM | POA: Diagnosis not present

## 2018-09-13 DIAGNOSIS — E1143 Type 2 diabetes mellitus with diabetic autonomic (poly)neuropathy: Secondary | ICD-10-CM | POA: Diagnosis not present

## 2018-09-13 DIAGNOSIS — R6521 Severe sepsis with septic shock: Secondary | ICD-10-CM | POA: Diagnosis not present

## 2018-09-13 DIAGNOSIS — L89322 Pressure ulcer of left buttock, stage 2: Secondary | ICD-10-CM | POA: Diagnosis not present

## 2018-09-13 DIAGNOSIS — D320 Benign neoplasm of cerebral meninges: Secondary | ICD-10-CM | POA: Diagnosis not present

## 2018-09-13 DIAGNOSIS — B370 Candidal stomatitis: Secondary | ICD-10-CM | POA: Diagnosis not present

## 2018-09-13 DIAGNOSIS — I4819 Other persistent atrial fibrillation: Secondary | ICD-10-CM | POA: Diagnosis not present

## 2018-09-13 DIAGNOSIS — I4891 Unspecified atrial fibrillation: Secondary | ICD-10-CM | POA: Diagnosis not present

## 2018-09-13 DIAGNOSIS — E1152 Type 2 diabetes mellitus with diabetic peripheral angiopathy with gangrene: Secondary | ICD-10-CM | POA: Diagnosis not present

## 2018-09-13 DIAGNOSIS — A0472 Enterocolitis due to Clostridium difficile, not specified as recurrent: Secondary | ICD-10-CM | POA: Diagnosis not present

## 2018-09-13 DIAGNOSIS — E1122 Type 2 diabetes mellitus with diabetic chronic kidney disease: Secondary | ICD-10-CM | POA: Diagnosis not present

## 2018-09-13 LAB — GLUCOSE, CAPILLARY
GLUCOSE-CAPILLARY: 104 mg/dL — AB (ref 70–99)
Glucose-Capillary: 65 mg/dL — ABNORMAL LOW (ref 70–99)
Glucose-Capillary: 201 mg/dL — ABNORMAL HIGH (ref 70–99)
Glucose-Capillary: 113 mg/dL — ABNORMAL HIGH (ref 70–99)

## 2018-09-13 MED ORDER — CEPHALEXIN 500 MG PO CAPS: 500.0000 mg | ORAL_CAPSULE | ORAL | 0 refills | Status: AC

## 2018-09-13 MED ORDER — CHLORHEXIDINE GLUCONATE CLOTH 2 % EX PADS: 6.0000 | MEDICATED_PAD | Freq: Every day | CUTANEOUS | Status: DC

## 2018-09-13 MED FILL — CEPHALEXIN 500 MG CAPSULE: 500 | 7 days supply | Qty: 7 | Fill #0

## 2018-09-13 NOTE — Progress Notes (Signed)
VASCULAR SURGERY ASSESSMENT & PLAN:   7 Days Post-Op s/p: Left transmetatarsal amputation.  She is day 3 of Keflex.  She will need continued twice daily dressing changes once she is discharged to the skilled nursing facility.  The wound on the TMA needs to be packed with a moist 2 x 2.  I will follow the wound as an outpatient.  The patient is on Eliquis for A. Fib.  Dialysis on Tuesdays Thursdays and Saturdays.  SUBJECTIVE:   No complaints this morning.  PHYSICAL EXAM:   Vitals:   09/12/18 0428 09/12/18 1552 09/12/18 1926 09/13/18 0527  BP: (!) 144/41 (!) 152/41 (!) 159/42 (!) 169/71  Pulse: 61 68 74 83  Resp:  17 15 16   Temp: 98.4 F (36.9 C) 98.1 F (36.7 C) 99 F (37.2 C) 98.2 F (36.8 C)  TempSrc: Oral Oral Oral Oral  SpO2:  100% 100% 97%  Weight:      Height:       The dressing change was just done by the nurse so I did not remove the dressing this morning.  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 allopurinol  100 mg Oral Q M,W,F  . amiodarone  200 mg Oral Daily  . apixaban  5 mg Oral BID  . atorvastatin  20 mg Oral QHS  . brimonidine  1 drop Left Eye BID   And  . timolol  1 drop Left Eye BID  . cephALEXin  500 mg Oral Q24H   . Chlorhexidine Gluconate Cloth  6 each Topical Q0600  . Chlorhexidine Gluconate Cloth  6 each Topical Q0600  . darbepoetin (ARANESP) injection - DIALYSIS  200 mcg Intravenous Q Tue-HD  . docusate sodium  100 mg Oral Daily  . feeding supplement (NEPRO CARB STEADY)  237 mL Oral BID BM  . feeding supplement (PRO-STAT SUGAR FREE 64)  30 mL Oral BID  . fentaNYL  25 mcg Transdermal Q72H  . folic acid  1 mg Oral Daily  . hydroxychloroquine  200 mg Oral Daily  . insulin aspart  0-5 Units Subcutaneous QHS  . insulin aspart  0-9 Units Subcutaneous TID WC  . metoprolol tartrate  12.5 mg Oral 2 times per day on Mon Wed Fri  . multivitamin with minerals  1 tablet Oral Daily  . pantoprazole  40 mg Oral Daily  . polyethylene glycol  17 g Oral Daily  . predniSONE  5 mg Oral Q breakfast  . sodium chloride flush  3 mL Intravenous Q12H  . Travoprost (BAK Free)  1 drop Both Eyes QHS  [name] Beeper: [phone] Office: [phone] 09/13/2018

## 2018-09-13 NOTE — Consult Note (Signed)
   [facility] CM Inpatient Consult   09/13/2018  [name] March 27, 1946 [id number]   Patient screened for potential [facility] Care Management services due to unplanned readmission risk score of 48% and 4 hospitalizations within the past 6 months.  Chart reviewed for [facility] Care Management needs. Current recommendation is for SNF. No [facility] CM needs at this time.  Netta Cedars, MSN, [facility] Liaison Nurse Mobile Phone [phone]  Toll free office [phone]

## 2018-09-13 NOTE — Progress Notes (Signed)
Pt "s dressing unravelled and left foot soaked in warm, soapy water. Drainage present where stiches removed.  Foot dried carefully. 4x4 folded in half and applied.  Kerlix wrapped followed by an ace. Medicated immediately before with 2mg  Morphine. Pt tolerated dressing change well. Will continue to monitor.

## 2018-09-13 NOTE — Clinical Social Work Placement (Signed)
   CLINICAL SOCIAL WORK PLACEMENT  NOTE  Date:  09/13/2018  Patient Details  Name: [name] MRN: [id number] Date of Birth: 02/23/1946  Clinical Social Work is seeking post-discharge placement for this patient at the [place] level of care (*CSW will initial, date and re-position this form in  chart as items are completed):      Patient/family provided with [facility] Work Department's list of facilities offering this level of care within the geographic area requested by the patient (or if unable, by the patient's family).  Yes   Patient/family informed of their freedom to choose among providers that offer the needed level of care, that participate in Medicare, Medicaid or managed care program needed by the patient, have an available bed and are willing to accept the patient.      Patient/family informed of [facility]'s ownership interest in [facility] and [facility], as well as of the fact that they are under no obligation to receive care at these facilities.  PASRR submitted to EDS on       PASRR number received on 09/08/18     Existing PASRR number confirmed on       FL2 transmitted to all facilities in geographic area requested by pt/family on 09/08/18     FL2 transmitted to all facilities within larger geographic area on       Patient informed that his/her managed care company has contracts with or will negotiate with certain facilities, including the following:        Yes   Patient/family informed of bed offers received.  Patient chooses bed at [facility]     Physician recommends and patient chooses bed at      Patient to be transferred to [facility] on 09/13/18.  Patient to be transferred to facility by [facility]     Patient family notified on 09/13/18 of transfer.  Name of family member notified:  Izell Dudley (spouse)     PHYSICIAN       Additional Comment:     _______________________________________________ Alberteen Sam, LCSW 09/13/2018, 12:19 PM

## 2018-09-13 NOTE — Progress Notes (Signed)
Occupational Therapy Treatment Patient Details Name: [name] MRN: [id number] DOB: 01/17/1946 Today's Date: 09/13/2018    History of present illness 72 y.o. female who underwent a right above-the-knee amputation on 06/08/2018. Presents with dry gangrene of the left forefoot with tibial artery occlusive disease. S/p L transmetatarsal amputation. PMH includes diabetes, end-stage renal disease, and tibial artery occlusive disease with prior arteriogram.     OT comments  Pt making good progress towards OT goals this session. Able to complete transfer from bed to recliner with sliding board and mod A+2 assist. Pt also able to complete dressing tasks with set up and grooming tasks with set up. Pt continues to benefit from skilled OT in the acute setting as well as afterwards at the SNF level to maximize safety and independence in ADL and functional transfers.    Follow Up Recommendations  SNF;Supervision/Assistance - 24 hour    Equipment Recommendations  (defer to next venue)    Recommendations for Other Services      Precautions / Restrictions Precautions Precautions: Fall Required Braces or Orthoses: Other Brace(Darco shoe for weightbearing through heel) Restrictions Weight Bearing Restrictions: Yes LLE Weight Bearing: Partial weight bearing Other Position/Activity Restrictions: weightbearing through heel only       Mobility Bed Mobility Overal bed mobility: Needs Assistance Bed Mobility: Supine to Sit     Supine to sit: Mod assist     General bed mobility comments: modA for scooting hips to EoB due to increased L LE pain with movement, heavy use of bed rails  Transfers Overall transfer level: Needs assistance Equipment used: Sliding board Transfers: Lateral/Scoot Transfers          Lateral/Scoot Transfers: With slide board;Mod assist;+2 safety/equipment General transfer comment: cues for safe hand placement on slide board with transition and for sequencing;  pt assisted as much as able and needs assistance for scooting hips EOB to drop arm recliner    Balance Overall balance assessment: Modified Independent Sitting-balance support: No upper extremity supported;Feet supported Sitting balance-Leahy Scale: Good                                     ADL either performed or assessed with clinical judgement   ADL Overall ADL's : Needs assistance/impaired     Grooming: Supervision/safety;Set up;Sitting;Wash/dry face Grooming Details (indicate cue type and reason): set up in recliner         Upper Body Dressing : Set up;Supervision/safety;Sitting Upper Body Dressing Details (indicate cue type and reason): to don new hospital gown     Toilet Transfer: Moderate assistance;+2 for physical assistance(simulated through bed to recliner) Toilet Transfer Details (indicate cue type and reason): sliding board transfer, pad used to decreased friction                 Vision       Perception     Praxis      Cognition Arousal/Alertness: Awake/alert Behavior During Therapy: WFL for tasks assessed/performed Overall Cognitive Status: Within Functional Limits for tasks assessed                                 General Comments: slow to process when attempting mobility tasks        Exercises     Shoulder Instructions       General Comments      Pertinent  Vitals/ Pain       Pain Assessment: Faces Faces Pain Scale: Hurts little more Pain Location: R side abdomen and L foot in dependent position Pain Descriptors / Indicators: Grimacing;Guarding;Throbbing;Aching Pain Intervention(s): Monitored during session;Repositioned  Home Living                                          Prior Functioning/Environment              Frequency  Min 2X/week        Progress Toward Goals  OT Goals(current goals can now be found in the care plan section)  Progress towards OT goals: Progressing  toward goals  Acute Rehab OT Goals Patient Stated Goal: get stronger OT Goal Formulation: With patient Time For Goal Achievement: 09/21/18 Potential to Achieve Goals: Good  Plan Discharge plan needs to be updated;Frequency remains appropriate    Co-evaluation                 AM-PAC OT "6 Clicks" Daily Activity     Outcome Measure   Help from another person eating meals?: None Help from another person taking care of personal grooming?: A Little Help from another person toileting, which includes using toliet, bedpan, or urinal?: A Lot Help from another person bathing (including washing, rinsing, drying)?: A Lot Help from another person to put on and taking off regular upper body clothing?: A Little Help from another person to put on and taking off regular lower body clothing?: A Lot 6 Click Score: 16    End of Session Equipment Utilized During Treatment: Other (comment)(sliding board)  OT Visit Diagnosis: Unsteadiness on feet (R26.81);Other abnormalities of gait and mobility (R26.89);Muscle weakness (generalized) (M62.81);Pain Pain - Right/Left: Left Pain - part of body: Ankle and joints of foot   Activity Tolerance Patient tolerated treatment well   Patient Left in chair;with call bell/phone within reach;with chair alarm set   Nurse Communication Mobility status        Time: [phone] OT Time Calculation (min): 26 min  Charges: OT General Charges $OT Visit: 1 Visit OT Treatments $Self Care/Home Management : 8-22 mins Hulda Humphrey OTR/L Acute Rehabilitation Services Pager: [phone] Office: [place] 09/13/2018, 9:32 AM

## 2018-09-13 NOTE — Progress Notes (Signed)
Called facility for report, [facility] LPN. [facility] picked up pt.

## 2018-09-13 NOTE — Progress Notes (Signed)
Patient will DC to: [facility] Anticipated DC date: 09/13/18 Family notified: [place] Transport by: Corey Harold  Per MD patient ready for DC to [facility]. RN, patient, patient's family, and facility notified of DC. Discharge Summary sent to facility. RN given number for report [phone]. DC packet on chart. Ambulance transport requested for patient.  CSW signing off.  [place], [place]

## 2018-09-13 NOTE — Progress Notes (Signed)
Subjective:   No new c/o  Objective Vital signs in last 24 hours: Vitals:   09/12/18 0428 09/12/18 1552 09/12/18 1926 09/13/18 0527  BP: (!) 144/41 (!) 152/41 (!) 159/42 (!) 169/71  Pulse: 61 68 74 83  Resp:  17 15 16   Temp: 98.4 F (36.9 C) 98.1 F (36.7 C) 99 F (37.2 C) 98.2 F (36.8 C)  TempSrc: Oral Oral Oral Oral  SpO2:  100% 100% 97%  Weight:      Height:       Weight change:   Physical Exam: General: alert, elderly female nad pleasant  Heart: RRR, no rub or mur Lungs: CTA bilat. Abdomen: Soft , Nt, ND Extremities: R AKA/ Left ft bandaged dry /clean / no pedal edema  Dialysis Access: R Perm cath   Dialysis Orders: TTS at [facility] 3.5h   400/800   62.5kg   2K/2.5ca   TDC  Heparin 2000 - Mircera 24mcg IV q 2 weeks (last 11/25)  Problem/Plan:  SPL TMA 09/06/18 (for gangrene): Severe PAD. On po abx for drainage from wound stump, per [facility].   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  Nutrition:Continueprotein supplements for wound healing.  Type 2 DM  A-fib (on Eliquis)= reg on exam   Dispo - awaiting SNF placement   Kelly Splinter MD [place] pager [phone]   09/13/2018, 9:19 AM    Labs: Basic Metabolic Panel: Recent Labs  Lab 09/08/18 0218 09/09/18 0757 09/11/18 1250  NA 134* 135 132*  K 3.1* 3.3* 4.6  CL 97* 95* 93*  CO2 26 27 27   GLUCOSE 92 83 148*  BUN 23 47* 32*  CREATININE 2.68* 4.55* 4.98*  CALCIUM 8.0* 8.1* 8.4*  PHOS  --  4.0 1.8*   Liver Function Tests: Recent Labs  Lab 09/09/18 0757 09/11/18 1250  ALBUMIN 1.6* 1.6*   No results  for input(s): LIPASE, AMYLASE in the last 168 hours. No results for input(s): AMMONIA in the last 168 hours. CBC: Recent Labs  Lab 09/07/18 0251 09/08/18 0218 09/09/18 0757 09/11/18 1250 09/12/18 0705  WBC 9.8 10.5 11.3* 9.1 7.8  HGB 8.5* 7.6* 7.6* 7.5* 7.8*  HCT 29.2* 26.1* 26.1* 26.3* 26.9*  MCV 93.3 92.2 93.5 94.3 93.1  PLT 262 227 261 288 282   Cardiac Enzymes: No results for input(s): CKTOTAL, CKMB, CKMBINDEX, TROPONINI in the last 168 hours. CBG: Recent Labs  Lab 09/12/18 0612 09/12/18 1128 09/12/18 1638 09/12/18 2132 09/13/18 0602  GLUCAP 130* 212* 65* 201* 104*    Studies/Results: No results found. Medications: . sodium chloride     . allopurinol  100 mg Oral Q M,W,F  . amiodarone  200 mg Oral Daily  . apixaban  5 mg Oral BID  . atorvastatin  20 mg Oral QHS  . brimonidine  1 drop Left Eye BID   And  . timolol  1 drop Left Eye BID  . cephALEXin  500 mg Oral Q24H  . Chlorhexidine Gluconate Cloth  6 each Topical Q0600  . darbepoetin (ARANESP) injection - DIALYSIS  200 mcg Intravenous Q Tue-HD  . docusate sodium  100 mg Oral Daily  .  feeding supplement (NEPRO CARB STEADY)  237 mL Oral BID BM  . feeding supplement (PRO-STAT SUGAR FREE 64)  30 mL Oral BID  . fentaNYL  25 mcg Transdermal Q72H  . folic acid  1 mg Oral Daily  . hydroxychloroquine  200 mg Oral Daily  . insulin aspart  0-5 Units Subcutaneous QHS  . insulin aspart  0-9 Units Subcutaneous TID WC  . metoprolol tartrate  12.5 mg Oral 2 times per day on Mon Wed Fri  . multivitamin with minerals  1 tablet Oral Daily  . pantoprazole  40 mg Oral Daily  . polyethylene glycol  17 g Oral Daily  . predniSONE  5 mg Oral Q breakfast  . sodium chloride flush  3 mL Intravenous Q12H  . Travoprost (BAK Free)  1 drop Both Eyes QHS

## 2018-09-13 NOTE — Progress Notes (Signed)
Physical Therapy Treatment Patient Details Name: [name] MRN: [id number] DOB: 11/18/1945 Today's Date: 09/13/2018    History of Present Illness 72 y.o. female who underwent a right above-the-knee amputation on 06/08/2018. Presents with dry gangrene of the left forefoot with tibial artery occlusive disease. S/p L transmetatarsal amputation. PMH includes diabetes, end-stage renal disease, and tibial artery occlusive disease with prior arteriogram.      PT Comments    Patient seen for mobility progression. Pt continues to require mod A +2 for functional transfers with sliding board. Pt c/o continued R side abdominal pain which pt's husband reported last session is chronic and L LE pain in dependent position. Continue to progress as tolerated.  Current plan remains appropriate.   Follow Up Recommendations  SNF     Equipment Recommendations  Other (comment)(TBD next venue)    Recommendations for Other Services       Precautions / Restrictions Precautions Precautions: Fall Required Braces or Orthoses: Other Brace(Darco shoe for weightbearing through heel) Restrictions Weight Bearing Restrictions: Yes LLE Weight Bearing: Partial weight bearing Other Position/Activity Restrictions: weightbearing through heel only    Mobility  Bed Mobility Overal bed mobility: Needs Assistance Bed Mobility: Supine to Sit     Supine to sit: Mod assist     General bed mobility comments: modA for scooting hips to EoB due to increased L LE pain with movement, heavy use of bed rails  Transfers Overall transfer level: Needs assistance Equipment used: Sliding board Transfers: Lateral/Scoot Transfers          Lateral/Scoot Transfers: With slide board;Mod assist;+2 safety/equipment;+2 physical assistance General transfer comment: cues for sequencing and safe hand placement; assistance required for scooting hips EOB to drop arm recliner; no weight through L LE during  transfer  Ambulation/Gait                 Stairs             Wheelchair Mobility    Modified Rankin (Stroke Patients Only)       Balance Overall balance assessment: Modified Independent Sitting-balance support: No upper extremity supported;Feet supported Sitting balance-Leahy Scale: Good                                      Cognition Arousal/Alertness: Awake/alert Behavior During Therapy: WFL for tasks assessed/performed Overall Cognitive Status: Within Functional Limits for tasks assessed                                 General Comments: pt seems to have improved cognition ans more alert since last PT session however does need cues for completion of all mobility tasks      Exercises General Exercises - Upper Extremity Chair Push Up: (pt educated on chair pushups ) General Exercises - Lower Extremity Long Arc Quad: AROM;Strengthening;Left;10 reps;Seated    General Comments        Pertinent Vitals/Pain Pain Assessment: Faces Faces Pain Scale: Hurts little more Pain Location: R side abdomen and L foot in dependent position Pain Descriptors / Indicators: Grimacing;Guarding;Throbbing;Aching Pain Intervention(s): Monitored during session;Repositioned    Home Living                      Prior Function            PT Goals (current goals can now  be found in the care plan section) Acute Rehab PT Goals Patient Stated Goal: get stronger Progress towards PT goals: Progressing toward goals    Frequency    Min 3X/week      PT Plan Current plan remains appropriate    Co-evaluation PT/OT/SLP Co-Evaluation/Treatment: Yes Reason for Co-Treatment: For patient/therapist safety;To address functional/ADL transfers PT goals addressed during session: Mobility/safety with mobility;Balance;Proper use of DME        AM-PAC PT "6 Clicks" Mobility   Outcome Measure  Help needed turning from your back to your side  while in a flat bed without using bedrails?: A Little Help needed moving from lying on your back to sitting on the side of a flat bed without using bedrails?: A Lot Help needed moving to and from a bed to a chair (including a wheelchair)?: A Lot Help needed standing up from a chair using your arms (e.g., wheelchair or bedside chair)?: Total Help needed to walk in hospital room?: Total Help needed climbing 3-5 steps with a railing? : Total 6 Click Score: 10    End of Session   Activity Tolerance: Patient tolerated treatment well Patient left: in chair;with call bell/phone within reach;with chair alarm set(geo mat cushion in chair) Nurse Communication: Mobility status PT Visit Diagnosis: Unsteadiness on feet (R26.81);Other abnormalities of gait and mobility (R26.89);Muscle weakness (generalized) (M62.81);Difficulty in walking, not elsewhere classified (R26.2);Pain Pain - Right/Left: Left Pain - part of body: Ankle and joints of foot     Time: 0569-7948 PT Time Calculation (min) (ACUTE ONLY): 27 min  Charges:  $Therapeutic Activity: 8-22 mins                     Earney Navy, PTA Acute Rehabilitation Services Pager: [phone] Office: [phone]     Darliss [place] 09/13/2018, 10:25 AM

## 2018-09-14 DIAGNOSIS — E876 Hypokalemia: Secondary | ICD-10-CM | POA: Diagnosis not present

## 2018-09-14 DIAGNOSIS — N186 End stage renal disease: Secondary | ICD-10-CM | POA: Diagnosis not present

## 2018-09-14 DIAGNOSIS — D689 Coagulation defect, unspecified: Secondary | ICD-10-CM | POA: Diagnosis not present

## 2018-09-14 DIAGNOSIS — D631 Anemia in chronic kidney disease: Secondary | ICD-10-CM | POA: Diagnosis not present

## 2018-09-14 DIAGNOSIS — R197 Diarrhea, unspecified: Secondary | ICD-10-CM | POA: Diagnosis not present

## 2018-09-14 DIAGNOSIS — E1129 Type 2 diabetes mellitus with other diabetic kidney complication: Secondary | ICD-10-CM | POA: Diagnosis not present

## 2018-09-14 DIAGNOSIS — R52 Pain, unspecified: Secondary | ICD-10-CM | POA: Diagnosis not present

## 2018-09-14 DIAGNOSIS — N2581 Secondary hyperparathyroidism of renal origin: Secondary | ICD-10-CM | POA: Diagnosis not present

## 2018-09-15 DIAGNOSIS — J449 Chronic obstructive pulmonary disease, unspecified: Secondary | ICD-10-CM | POA: Diagnosis not present

## 2018-09-15 DIAGNOSIS — E440 Moderate protein-calorie malnutrition: Secondary | ICD-10-CM | POA: Diagnosis not present

## 2018-09-15 DIAGNOSIS — N186 End stage renal disease: Secondary | ICD-10-CM | POA: Diagnosis not present

## 2018-09-15 DIAGNOSIS — E1140 Type 2 diabetes mellitus with diabetic neuropathy, unspecified: Secondary | ICD-10-CM | POA: Diagnosis not present

## 2018-09-16 DIAGNOSIS — E1129 Type 2 diabetes mellitus with other diabetic kidney complication: Secondary | ICD-10-CM | POA: Diagnosis not present

## 2018-09-16 DIAGNOSIS — E876 Hypokalemia: Secondary | ICD-10-CM | POA: Diagnosis not present

## 2018-09-16 DIAGNOSIS — D689 Coagulation defect, unspecified: Secondary | ICD-10-CM | POA: Diagnosis not present

## 2018-09-16 DIAGNOSIS — R52 Pain, unspecified: Secondary | ICD-10-CM | POA: Diagnosis not present

## 2018-09-16 DIAGNOSIS — N2581 Secondary hyperparathyroidism of renal origin: Secondary | ICD-10-CM | POA: Diagnosis not present

## 2018-09-16 DIAGNOSIS — N186 End stage renal disease: Secondary | ICD-10-CM | POA: Diagnosis not present

## 2018-09-16 DIAGNOSIS — R197 Diarrhea, unspecified: Secondary | ICD-10-CM | POA: Diagnosis not present

## 2018-09-16 DIAGNOSIS — D631 Anemia in chronic kidney disease: Secondary | ICD-10-CM | POA: Diagnosis not present

## 2018-09-17 ENCOUNTER — Encounter (INDEPENDENT_AMBULATORY_CARE_PROVIDER_SITE_OTHER): Payer: Medicare Other | Admitting: Ophthalmology

## 2018-09-18 DIAGNOSIS — E876 Hypokalemia: Secondary | ICD-10-CM | POA: Diagnosis not present

## 2018-09-18 DIAGNOSIS — R52 Pain, unspecified: Secondary | ICD-10-CM | POA: Diagnosis not present

## 2018-09-18 DIAGNOSIS — E1129 Type 2 diabetes mellitus with other diabetic kidney complication: Secondary | ICD-10-CM | POA: Diagnosis not present

## 2018-09-18 DIAGNOSIS — D689 Coagulation defect, unspecified: Secondary | ICD-10-CM | POA: Diagnosis not present

## 2018-09-18 DIAGNOSIS — N186 End stage renal disease: Secondary | ICD-10-CM | POA: Diagnosis not present

## 2018-09-18 DIAGNOSIS — R197 Diarrhea, unspecified: Secondary | ICD-10-CM | POA: Diagnosis not present

## 2018-09-18 DIAGNOSIS — N2581 Secondary hyperparathyroidism of renal origin: Secondary | ICD-10-CM | POA: Diagnosis not present

## 2018-09-18 DIAGNOSIS — D631 Anemia in chronic kidney disease: Secondary | ICD-10-CM | POA: Diagnosis not present

## 2018-09-20 DIAGNOSIS — R197 Diarrhea, unspecified: Secondary | ICD-10-CM | POA: Diagnosis not present

## 2018-09-20 DIAGNOSIS — N186 End stage renal disease: Secondary | ICD-10-CM | POA: Diagnosis not present

## 2018-09-20 DIAGNOSIS — E876 Hypokalemia: Secondary | ICD-10-CM | POA: Diagnosis not present

## 2018-09-20 DIAGNOSIS — E1129 Type 2 diabetes mellitus with other diabetic kidney complication: Secondary | ICD-10-CM | POA: Diagnosis not present

## 2018-09-20 DIAGNOSIS — R52 Pain, unspecified: Secondary | ICD-10-CM | POA: Diagnosis not present

## 2018-09-20 DIAGNOSIS — D689 Coagulation defect, unspecified: Secondary | ICD-10-CM | POA: Diagnosis not present

## 2018-09-20 DIAGNOSIS — D631 Anemia in chronic kidney disease: Secondary | ICD-10-CM | POA: Diagnosis not present

## 2018-09-20 DIAGNOSIS — N2581 Secondary hyperparathyroidism of renal origin: Secondary | ICD-10-CM | POA: Diagnosis not present

## 2018-09-23 DIAGNOSIS — N186 End stage renal disease: Secondary | ICD-10-CM | POA: Diagnosis not present

## 2018-09-23 DIAGNOSIS — E876 Hypokalemia: Secondary | ICD-10-CM | POA: Diagnosis not present

## 2018-09-23 DIAGNOSIS — D689 Coagulation defect, unspecified: Secondary | ICD-10-CM | POA: Diagnosis not present

## 2018-09-23 DIAGNOSIS — N2581 Secondary hyperparathyroidism of renal origin: Secondary | ICD-10-CM | POA: Diagnosis not present

## 2018-09-23 DIAGNOSIS — D631 Anemia in chronic kidney disease: Secondary | ICD-10-CM | POA: Diagnosis not present

## 2018-09-23 DIAGNOSIS — E1129 Type 2 diabetes mellitus with other diabetic kidney complication: Secondary | ICD-10-CM | POA: Diagnosis not present

## 2018-09-23 DIAGNOSIS — R52 Pain, unspecified: Secondary | ICD-10-CM | POA: Diagnosis not present

## 2018-09-23 DIAGNOSIS — R197 Diarrhea, unspecified: Secondary | ICD-10-CM | POA: Diagnosis not present

## 2018-09-24 ENCOUNTER — Other Ambulatory Visit: Payer: Self-pay

## 2018-09-24 ENCOUNTER — Inpatient Hospital Stay (HOSPITAL_COMMUNITY)
Admission: EM | Admit: 2018-09-24 | Discharge: 2018-10-23 | DRG: 856 | Disposition: A | Discharge status: Other Acute Inpatient Hospital | Payer: Medicare Other | Attending: Family Medicine | Admitting: Family Medicine

## 2018-09-24 ENCOUNTER — Encounter: Payer: Medicare Other | Admitting: Physical Medicine & Rehabilitation

## 2018-09-24 ENCOUNTER — Inpatient Hospital Stay: Admission: AD | Admit: 2018-09-24 | Payer: Medicare Other | Source: Ambulatory Visit | Admitting: Internal Medicine

## 2018-09-24 ENCOUNTER — Ambulatory Visit (INDEPENDENT_AMBULATORY_CARE_PROVIDER_SITE_OTHER): Payer: Self-pay | Admitting: Physician Assistant

## 2018-09-24 ENCOUNTER — Inpatient Hospital Stay (HOSPITAL_COMMUNITY): Payer: Medicare Other

## 2018-09-24 ENCOUNTER — Encounter (HOSPITAL_COMMUNITY): Payer: Self-pay | Admitting: Emergency Medicine

## 2018-09-24 ENCOUNTER — Emergency Department (HOSPITAL_COMMUNITY): Payer: Medicare Other

## 2018-09-24 VITALS — BP 134/54 | HR 75 | Temp 101.0°F | Resp 20 | Ht 60.0 in | Wt 134.0 lb

## 2018-09-24 DIAGNOSIS — Z1612 Extended spectrum beta lactamase (ESBL) resistance: Secondary | ICD-10-CM | POA: Diagnosis not present

## 2018-09-24 DIAGNOSIS — R52 Pain, unspecified: Secondary | ICD-10-CM | POA: Diagnosis not present

## 2018-09-24 DIAGNOSIS — J849 Interstitial pulmonary disease, unspecified: Secondary | ICD-10-CM | POA: Diagnosis present

## 2018-09-24 DIAGNOSIS — Y95 Nosocomial condition: Secondary | ICD-10-CM | POA: Diagnosis not present

## 2018-09-24 DIAGNOSIS — L89326 Pressure-induced deep tissue damage of left buttock: Secondary | ICD-10-CM | POA: Diagnosis not present

## 2018-09-24 DIAGNOSIS — D649 Anemia, unspecified: Secondary | ICD-10-CM | POA: Diagnosis not present

## 2018-09-24 DIAGNOSIS — Z9981 Dependence on supplemental oxygen: Secondary | ICD-10-CM | POA: Diagnosis not present

## 2018-09-24 DIAGNOSIS — J9601 Acute respiratory failure with hypoxia: Secondary | ICD-10-CM | POA: Diagnosis not present

## 2018-09-24 DIAGNOSIS — T8142XA Infection following a procedure, deep incisional surgical site, initial encounter: Secondary | ICD-10-CM | POA: Diagnosis present

## 2018-09-24 DIAGNOSIS — D62 Acute posthemorrhagic anemia: Secondary | ICD-10-CM | POA: Diagnosis not present

## 2018-09-24 DIAGNOSIS — T859XXA Unspecified complication of internal prosthetic device, implant and graft, initial encounter: Secondary | ICD-10-CM | POA: Diagnosis not present

## 2018-09-24 DIAGNOSIS — E1142 Type 2 diabetes mellitus with diabetic polyneuropathy: Secondary | ICD-10-CM | POA: Diagnosis present

## 2018-09-24 DIAGNOSIS — I10 Essential (primary) hypertension: Secondary | ICD-10-CM | POA: Diagnosis not present

## 2018-09-24 DIAGNOSIS — Z792 Long term (current) use of antibiotics: Secondary | ICD-10-CM

## 2018-09-24 DIAGNOSIS — Z515 Encounter for palliative care: Secondary | ICD-10-CM

## 2018-09-24 DIAGNOSIS — N179 Acute kidney failure, unspecified: Secondary | ICD-10-CM | POA: Diagnosis not present

## 2018-09-24 DIAGNOSIS — L89896 Pressure-induced deep tissue damage of other site: Secondary | ICD-10-CM | POA: Diagnosis not present

## 2018-09-24 DIAGNOSIS — G8929 Other chronic pain: Secondary | ICD-10-CM | POA: Diagnosis present

## 2018-09-24 DIAGNOSIS — I4819 Other persistent atrial fibrillation: Secondary | ICD-10-CM | POA: Diagnosis not present

## 2018-09-24 DIAGNOSIS — I70262 Atherosclerosis of native arteries of extremities with gangrene, left leg: Secondary | ICD-10-CM | POA: Diagnosis not present

## 2018-09-24 DIAGNOSIS — L8993 Pressure ulcer of unspecified site, stage 3: Secondary | ICD-10-CM

## 2018-09-24 DIAGNOSIS — I471 Supraventricular tachycardia: Secondary | ICD-10-CM | POA: Diagnosis not present

## 2018-09-24 DIAGNOSIS — J189 Pneumonia, unspecified organism: Secondary | ICD-10-CM

## 2018-09-24 DIAGNOSIS — Z89432 Acquired absence of left foot: Secondary | ICD-10-CM | POA: Diagnosis not present

## 2018-09-24 DIAGNOSIS — H409 Unspecified glaucoma: Secondary | ICD-10-CM | POA: Diagnosis present

## 2018-09-24 DIAGNOSIS — Z89612 Acquired absence of left leg above knee: Secondary | ICD-10-CM

## 2018-09-24 DIAGNOSIS — R0789 Other chest pain: Secondary | ICD-10-CM | POA: Diagnosis not present

## 2018-09-24 DIAGNOSIS — I132 Hypertensive heart and chronic kidney disease with heart failure and with stage 5 chronic kidney disease, or end stage renal disease: Secondary | ICD-10-CM | POA: Diagnosis not present

## 2018-09-24 DIAGNOSIS — E1129 Type 2 diabetes mellitus with other diabetic kidney complication: Secondary | ICD-10-CM | POA: Diagnosis not present

## 2018-09-24 DIAGNOSIS — F329 Major depressive disorder, single episode, unspecified: Secondary | ICD-10-CM | POA: Diagnosis present

## 2018-09-24 DIAGNOSIS — D631 Anemia in chronic kidney disease: Secondary | ICD-10-CM | POA: Diagnosis present

## 2018-09-24 DIAGNOSIS — I5032 Chronic diastolic (congestive) heart failure: Secondary | ICD-10-CM | POA: Diagnosis present

## 2018-09-24 DIAGNOSIS — Z888 Allergy status to other drugs, medicaments and biological substances status: Secondary | ICD-10-CM | POA: Diagnosis not present

## 2018-09-24 DIAGNOSIS — Z4682 Encounter for fitting and adjustment of non-vascular catheter: Secondary | ICD-10-CM | POA: Diagnosis not present

## 2018-09-24 DIAGNOSIS — K922 Gastrointestinal hemorrhage, unspecified: Secondary | ICD-10-CM | POA: Diagnosis not present

## 2018-09-24 DIAGNOSIS — E8889 Other specified metabolic disorders: Secondary | ICD-10-CM | POA: Diagnosis present

## 2018-09-24 DIAGNOSIS — D72829 Elevated white blood cell count, unspecified: Secondary | ICD-10-CM | POA: Diagnosis not present

## 2018-09-24 DIAGNOSIS — J439 Emphysema, unspecified: Secondary | ICD-10-CM | POA: Diagnosis present

## 2018-09-24 DIAGNOSIS — T380X5A Adverse effect of glucocorticoids and synthetic analogues, initial encounter: Secondary | ICD-10-CM | POA: Diagnosis not present

## 2018-09-24 DIAGNOSIS — Z992 Dependence on renal dialysis: Secondary | ICD-10-CM | POA: Diagnosis not present

## 2018-09-24 DIAGNOSIS — Z89611 Acquired absence of right leg above knee: Secondary | ICD-10-CM | POA: Diagnosis not present

## 2018-09-24 DIAGNOSIS — E1152 Type 2 diabetes mellitus with diabetic peripheral angiopathy with gangrene: Secondary | ICD-10-CM | POA: Diagnosis not present

## 2018-09-24 DIAGNOSIS — K219 Gastro-esophageal reflux disease without esophagitis: Secondary | ICD-10-CM | POA: Diagnosis present

## 2018-09-24 DIAGNOSIS — L97529 Non-pressure chronic ulcer of other part of left foot with unspecified severity: Secondary | ICD-10-CM

## 2018-09-24 DIAGNOSIS — I509 Heart failure, unspecified: Secondary | ICD-10-CM | POA: Diagnosis not present

## 2018-09-24 DIAGNOSIS — J449 Chronic obstructive pulmonary disease, unspecified: Secondary | ICD-10-CM | POA: Diagnosis present

## 2018-09-24 DIAGNOSIS — I4892 Unspecified atrial flutter: Secondary | ICD-10-CM | POA: Diagnosis not present

## 2018-09-24 DIAGNOSIS — G8918 Other acute postprocedural pain: Secondary | ICD-10-CM | POA: Diagnosis not present

## 2018-09-24 DIAGNOSIS — A0472 Enterocolitis due to Clostridium difficile, not specified as recurrent: Secondary | ICD-10-CM | POA: Diagnosis not present

## 2018-09-24 DIAGNOSIS — M160 Bilateral primary osteoarthritis of hip: Secondary | ICD-10-CM | POA: Diagnosis not present

## 2018-09-24 DIAGNOSIS — R0602 Shortness of breath: Secondary | ICD-10-CM

## 2018-09-24 DIAGNOSIS — R1310 Dysphagia, unspecified: Secondary | ICD-10-CM

## 2018-09-24 DIAGNOSIS — E782 Mixed hyperlipidemia: Secondary | ICD-10-CM | POA: Diagnosis not present

## 2018-09-24 DIAGNOSIS — R6521 Severe sepsis with septic shock: Secondary | ICD-10-CM | POA: Diagnosis not present

## 2018-09-24 DIAGNOSIS — G9341 Metabolic encephalopathy: Secondary | ICD-10-CM | POA: Diagnosis not present

## 2018-09-24 DIAGNOSIS — Y828 Other medical devices associated with adverse incidents: Secondary | ICD-10-CM | POA: Diagnosis not present

## 2018-09-24 DIAGNOSIS — Z794 Long term (current) use of insulin: Secondary | ICD-10-CM

## 2018-09-24 DIAGNOSIS — D4101 Neoplasm of uncertain behavior of right kidney: Secondary | ICD-10-CM | POA: Diagnosis present

## 2018-09-24 DIAGNOSIS — E11649 Type 2 diabetes mellitus with hypoglycemia without coma: Secondary | ICD-10-CM | POA: Diagnosis not present

## 2018-09-24 DIAGNOSIS — E1140 Type 2 diabetes mellitus with diabetic neuropathy, unspecified: Secondary | ICD-10-CM | POA: Diagnosis not present

## 2018-09-24 DIAGNOSIS — K210 Gastro-esophageal reflux disease with esophagitis: Secondary | ICD-10-CM | POA: Diagnosis present

## 2018-09-24 DIAGNOSIS — Z7189 Other specified counseling: Secondary | ICD-10-CM

## 2018-09-24 DIAGNOSIS — J155 Pneumonia due to Escherichia coli: Secondary | ICD-10-CM | POA: Diagnosis not present

## 2018-09-24 DIAGNOSIS — A4151 Sepsis due to Escherichia coli [E. coli]: Secondary | ICD-10-CM | POA: Diagnosis not present

## 2018-09-24 DIAGNOSIS — E43 Unspecified severe protein-calorie malnutrition: Secondary | ICD-10-CM | POA: Diagnosis not present

## 2018-09-24 DIAGNOSIS — I480 Paroxysmal atrial fibrillation: Secondary | ICD-10-CM | POA: Diagnosis not present

## 2018-09-24 DIAGNOSIS — R05 Cough: Secondary | ICD-10-CM | POA: Diagnosis not present

## 2018-09-24 DIAGNOSIS — R627 Adult failure to thrive: Secondary | ICD-10-CM

## 2018-09-24 DIAGNOSIS — R509 Fever, unspecified: Secondary | ICD-10-CM | POA: Diagnosis not present

## 2018-09-24 DIAGNOSIS — Z6826 Body mass index (BMI) 26.0-26.9, adult: Secondary | ICD-10-CM

## 2018-09-24 DIAGNOSIS — L089 Local infection of the skin and subcutaneous tissue, unspecified: Secondary | ICD-10-CM | POA: Diagnosis not present

## 2018-09-24 DIAGNOSIS — Z8249 Family history of ischemic heart disease and other diseases of the circulatory system: Secondary | ICD-10-CM

## 2018-09-24 DIAGNOSIS — M1990 Unspecified osteoarthritis, unspecified site: Secondary | ICD-10-CM | POA: Diagnosis present

## 2018-09-24 DIAGNOSIS — Z7952 Long term (current) use of systemic steroids: Secondary | ICD-10-CM

## 2018-09-24 DIAGNOSIS — L97519 Non-pressure chronic ulcer of other part of right foot with unspecified severity: Secondary | ICD-10-CM

## 2018-09-24 DIAGNOSIS — Z86718 Personal history of other venous thrombosis and embolism: Secondary | ICD-10-CM

## 2018-09-24 DIAGNOSIS — Y835 Amputation of limb(s) as the cause of abnormal reaction of the patient, or of later complication, without mention of misadventure at the time of the procedure: Secondary | ICD-10-CM | POA: Diagnosis not present

## 2018-09-24 DIAGNOSIS — E1143 Type 2 diabetes mellitus with diabetic autonomic (poly)neuropathy: Secondary | ICD-10-CM | POA: Diagnosis not present

## 2018-09-24 DIAGNOSIS — J4520 Mild intermittent asthma, uncomplicated: Secondary | ICD-10-CM | POA: Diagnosis not present

## 2018-09-24 DIAGNOSIS — J9690 Respiratory failure, unspecified, unspecified whether with hypoxia or hypercapnia: Secondary | ICD-10-CM | POA: Diagnosis not present

## 2018-09-24 DIAGNOSIS — N186 End stage renal disease: Secondary | ICD-10-CM | POA: Diagnosis present

## 2018-09-24 DIAGNOSIS — Z833 Family history of diabetes mellitus: Secondary | ICD-10-CM

## 2018-09-24 DIAGNOSIS — Y92239 Unspecified place in hospital as the place of occurrence of the external cause: Secondary | ICD-10-CM | POA: Diagnosis not present

## 2018-09-24 DIAGNOSIS — R1084 Generalized abdominal pain: Secondary | ICD-10-CM

## 2018-09-24 DIAGNOSIS — T8130XA Disruption of wound, unspecified, initial encounter: Secondary | ICD-10-CM | POA: Diagnosis present

## 2018-09-24 DIAGNOSIS — E1122 Type 2 diabetes mellitus with diabetic chronic kidney disease: Secondary | ICD-10-CM | POA: Diagnosis not present

## 2018-09-24 DIAGNOSIS — Z978 Presence of other specified devices: Secondary | ICD-10-CM

## 2018-09-24 DIAGNOSIS — L89153 Pressure ulcer of sacral region, stage 3: Secondary | ICD-10-CM | POA: Diagnosis not present

## 2018-09-24 DIAGNOSIS — M329 Systemic lupus erythematosus, unspecified: Secondary | ICD-10-CM | POA: Diagnosis present

## 2018-09-24 DIAGNOSIS — B370 Candidal stomatitis: Secondary | ICD-10-CM

## 2018-09-24 DIAGNOSIS — Z4659 Encounter for fitting and adjustment of other gastrointestinal appliance and device: Secondary | ICD-10-CM

## 2018-09-24 DIAGNOSIS — E1151 Type 2 diabetes mellitus with diabetic peripheral angiopathy without gangrene: Secondary | ICD-10-CM | POA: Diagnosis not present

## 2018-09-24 DIAGNOSIS — I5033 Acute on chronic diastolic (congestive) heart failure: Secondary | ICD-10-CM

## 2018-09-24 DIAGNOSIS — R278 Other lack of coordination: Secondary | ICD-10-CM | POA: Diagnosis present

## 2018-09-24 DIAGNOSIS — L299 Pruritus, unspecified: Secondary | ICD-10-CM | POA: Diagnosis not present

## 2018-09-24 DIAGNOSIS — Z86010 Personal history of colonic polyps: Secondary | ICD-10-CM

## 2018-09-24 DIAGNOSIS — Z87891 Personal history of nicotine dependence: Secondary | ICD-10-CM

## 2018-09-24 DIAGNOSIS — J9600 Acute respiratory failure, unspecified whether with hypoxia or hypercapnia: Secondary | ICD-10-CM | POA: Diagnosis not present

## 2018-09-24 DIAGNOSIS — N2581 Secondary hyperparathyroidism of renal origin: Secondary | ICD-10-CM | POA: Diagnosis not present

## 2018-09-24 DIAGNOSIS — I959 Hypotension, unspecified: Secondary | ICD-10-CM | POA: Diagnosis not present

## 2018-09-24 DIAGNOSIS — R0902 Hypoxemia: Secondary | ICD-10-CM | POA: Diagnosis not present

## 2018-09-24 DIAGNOSIS — I4891 Unspecified atrial fibrillation: Secondary | ICD-10-CM | POA: Diagnosis present

## 2018-09-24 DIAGNOSIS — E1165 Type 2 diabetes mellitus with hyperglycemia: Secondary | ICD-10-CM | POA: Diagnosis not present

## 2018-09-24 DIAGNOSIS — R109 Unspecified abdominal pain: Secondary | ICD-10-CM | POA: Diagnosis not present

## 2018-09-24 DIAGNOSIS — E878 Other disorders of electrolyte and fluid balance, not elsewhere classified: Secondary | ICD-10-CM | POA: Diagnosis not present

## 2018-09-24 DIAGNOSIS — B359 Dermatophytosis, unspecified: Secondary | ICD-10-CM | POA: Diagnosis present

## 2018-09-24 DIAGNOSIS — I120 Hypertensive chronic kidney disease with stage 5 chronic kidney disease or end stage renal disease: Secondary | ICD-10-CM | POA: Diagnosis not present

## 2018-09-24 DIAGNOSIS — E875 Hyperkalemia: Secondary | ICD-10-CM | POA: Diagnosis not present

## 2018-09-24 DIAGNOSIS — L03116 Cellulitis of left lower limb: Secondary | ICD-10-CM | POA: Diagnosis present

## 2018-09-24 DIAGNOSIS — E876 Hypokalemia: Secondary | ICD-10-CM | POA: Diagnosis not present

## 2018-09-24 DIAGNOSIS — Z0189 Encounter for other specified special examinations: Secondary | ICD-10-CM

## 2018-09-24 DIAGNOSIS — Z823 Family history of stroke: Secondary | ICD-10-CM

## 2018-09-24 DIAGNOSIS — L97503 Non-pressure chronic ulcer of other part of unspecified foot with necrosis of muscle: Secondary | ICD-10-CM | POA: Diagnosis not present

## 2018-09-24 DIAGNOSIS — R740 Nonspecific elevation of levels of transaminase and lactic acid dehydrogenase [LDH]: Secondary | ICD-10-CM | POA: Diagnosis not present

## 2018-09-24 DIAGNOSIS — Z79899 Other long term (current) drug therapy: Secondary | ICD-10-CM

## 2018-09-24 DIAGNOSIS — L89892 Pressure ulcer of other site, stage 2: Secondary | ICD-10-CM | POA: Diagnosis not present

## 2018-09-24 DIAGNOSIS — D320 Benign neoplasm of cerebral meninges: Secondary | ICD-10-CM | POA: Diagnosis present

## 2018-09-24 DIAGNOSIS — A419 Sepsis, unspecified organism: Secondary | ICD-10-CM | POA: Diagnosis not present

## 2018-09-24 DIAGNOSIS — J9691 Respiratory failure, unspecified with hypoxia: Secondary | ICD-10-CM | POA: Diagnosis not present

## 2018-09-24 DIAGNOSIS — R059 Cough, unspecified: Secondary | ICD-10-CM

## 2018-09-24 DIAGNOSIS — M109 Gout, unspecified: Secondary | ICD-10-CM | POA: Diagnosis present

## 2018-09-24 DIAGNOSIS — E440 Moderate protein-calorie malnutrition: Secondary | ICD-10-CM | POA: Diagnosis not present

## 2018-09-24 DIAGNOSIS — I739 Peripheral vascular disease, unspecified: Secondary | ICD-10-CM

## 2018-09-24 DIAGNOSIS — S81809S Unspecified open wound, unspecified lower leg, sequela: Secondary | ICD-10-CM

## 2018-09-24 DIAGNOSIS — Z79891 Long term (current) use of opiate analgesic: Secondary | ICD-10-CM

## 2018-09-24 DIAGNOSIS — Z9911 Dependence on respirator [ventilator] status: Secondary | ICD-10-CM | POA: Diagnosis not present

## 2018-09-24 LAB — I-STAT CHEM 8, ED
BUN: 48 mg/dL — ABNORMAL HIGH (ref 8–23)
Calcium, Ion: 1.02 mmol/L — ABNORMAL LOW (ref 1.15–1.40)
Chloride: 95 mmol/L — ABNORMAL LOW (ref 98–111)
Creatinine, Ser: 4.3 mg/dL — ABNORMAL HIGH (ref 0.44–1.00)
Glucose, Bld: 107 mg/dL — ABNORMAL HIGH (ref 70–99)
HCT: 35 % — ABNORMAL LOW (ref 36.0–46.0)
Hemoglobin: 11.9 g/dL — ABNORMAL LOW (ref 12.0–15.0)
Potassium: 3.8 mmol/L (ref 3.5–5.1)
SODIUM: 132 mmol/L — AB (ref 135–145)
TCO2: 32 mmol/L (ref 22–32)

## 2018-09-24 LAB — CBC WITH DIFFERENTIAL/PLATELET
Abs Immature Granulocytes: 0.02 10*3/uL (ref 0.00–0.07)
BASOS PCT: 0 %
Basophils Absolute: 0 10*3/uL (ref 0.0–0.1)
Eosinophils Absolute: 0 10*3/uL (ref 0.0–0.5)
Eosinophils Relative: 0 %
HCT: 33.2 % — ABNORMAL LOW (ref 36.0–46.0)
Hemoglobin: 9.5 g/dL — ABNORMAL LOW (ref 12.0–15.0)
Immature Granulocytes: 0 %
Lymphocytes Relative: 7 %
Lymphs Abs: 0.5 10*3/uL — ABNORMAL LOW (ref 0.7–4.0)
MCH: 27.1 pg (ref 26.0–34.0)
MCHC: 28.6 g/dL — ABNORMAL LOW (ref 30.0–36.0)
MCV: 94.9 fL (ref 80.0–100.0)
Monocytes Absolute: 0.6 10*3/uL (ref 0.1–1.0)
Monocytes Relative: 9 %
Neutro Abs: 5.9 10*3/uL (ref 1.7–7.7)
Neutrophils Relative %: 84 %
Platelets: 192 10*3/uL (ref 150–400)
RBC: 3.5 MIL/uL — AB (ref 3.87–5.11)
RDW: 18.1 % — ABNORMAL HIGH (ref 11.5–15.5)
WBC: 7.1 10*3/uL (ref 4.0–10.5)
nRBC: 0 % (ref 0.0–0.2)

## 2018-09-24 LAB — COMPREHENSIVE METABOLIC PANEL
ALT: 9 U/L (ref 0–44)
AST: 31 U/L (ref 15–41)
Albumin: 1.9 g/dL — ABNORMAL LOW (ref 3.5–5.0)
Alkaline Phosphatase: 72 U/L (ref 38–126)
Anion gap: 8 (ref 5–15)
BUN: 38 mg/dL — ABNORMAL HIGH (ref 8–23)
CO2: 31 mmol/L (ref 22–32)
Calcium: 7.8 mg/dL — ABNORMAL LOW (ref 8.9–10.3)
Chloride: 94 mmol/L — ABNORMAL LOW (ref 98–111)
Creatinine, Ser: 4.22 mg/dL — ABNORMAL HIGH (ref 0.44–1.00)
GFR calc Af Amer: 11 mL/min — ABNORMAL LOW (ref >60)
GFR, EST NON AFRICAN AMERICAN: 10 mL/min — AB (ref >60)
Glucose, Bld: 114 mg/dL — ABNORMAL HIGH (ref 70–99)
POTASSIUM: 3.6 mmol/L (ref 3.5–5.1)
Sodium: 133 mmol/L — ABNORMAL LOW (ref 135–145)
TOTAL PROTEIN: 6.3 g/dL — AB (ref 6.5–8.1)
Total Bilirubin: 0.6 mg/dL (ref 0.3–1.2)

## 2018-09-24 LAB — I-STAT CG4 LACTIC ACID, ED: Lactic Acid, Venous: 1.35 mmol/L (ref 0.5–1.9)

## 2018-09-24 MED ORDER — ACETAMINOPHEN 325 MG PO TABS
650.0000 mg | ORAL_TABLET | Freq: Four times a day (QID) | ORAL | Status: DC | PRN
  Administered 2018-09-25 – 2018-09-29 (×4): 650 mg via ORAL
  Filled 2018-09-24 (×4): qty 2

## 2018-09-24 MED ORDER — HEPARIN (PORCINE) 25000 UT/250ML-% IV SOLN: 900.0000 [IU]/h | INTRAVENOUS | Status: DC

## 2018-09-24 MED ORDER — ACETAMINOPHEN 650 MG RE SUPP: 650.0000 mg | Freq: Four times a day (QID) | RECTAL | Status: DC | PRN

## 2018-09-24 MED ORDER — VANCOMYCIN HCL IN DEXTROSE 1-5 GM/200ML-% IV SOLN
1000.0000 mg | Freq: Once | INTRAVENOUS | Status: AC
  Administered 2018-09-25: 1000 mg via INTRAVENOUS
  Filled 2018-09-24: qty 200

## 2018-09-24 MED ORDER — SODIUM CHLORIDE 0.9 % IV SOLN
1.0000 g | INTRAVENOUS | Status: DC
  Administered 2018-09-24 – 2018-09-26 (×3): 1 g via INTRAVENOUS
  Filled 2018-09-24 (×3): qty 10

## 2018-09-24 MED ORDER — INSULIN ASPART 100 UNIT/ML ~~LOC~~ SOLN
0.0000 [IU] | Freq: Three times a day (TID) | SUBCUTANEOUS | Status: DC
  Administered 2018-09-25 – 2018-09-26 (×3): 1 [IU] via SUBCUTANEOUS
  Administered 2018-09-26: 2 [IU] via SUBCUTANEOUS
  Administered 2018-09-26 – 2018-09-28 (×3): 1 [IU] via SUBCUTANEOUS

## 2018-09-24 MED ORDER — INSULIN ASPART 100 UNIT/ML ~~LOC~~ SOLN: 0.0000 [IU] | Freq: Every day | SUBCUTANEOUS | Status: DC

## 2018-09-24 MED ORDER — VANCOMYCIN HCL IN DEXTROSE 750-5 MG/150ML-% IV SOLN
750.0000 mg | INTRAVENOUS | Status: DC
  Administered 2018-09-25: 750 mg via INTRAVENOUS
  Filled 2018-09-24: qty 150

## 2018-09-24 MED ORDER — PIPERACILLIN-TAZOBACTAM 3.375 G IVPB 30 MIN
3.3750 g | Freq: Once | INTRAVENOUS | Status: AC
  Administered 2018-09-24: 3.375 g via INTRAVENOUS
  Filled 2018-09-24: qty 50

## 2018-09-24 MED ORDER — HYDRALAZINE HCL 20 MG/ML IJ SOLN
5.0000 mg | INTRAMUSCULAR | Status: DC | PRN
  Administered 2018-09-29 – 2018-10-16 (×3): 5 mg via INTRAVENOUS
  Filled 2018-09-24 (×3): qty 1

## 2018-09-24 MED ORDER — ALBUTEROL SULFATE (2.5 MG/3ML) 0.083% IN NEBU
2.5000 mg | INHALATION_SOLUTION | RESPIRATORY_TRACT | Status: DC | PRN
  Administered 2018-09-29: 2.5 mg via RESPIRATORY_TRACT
  Filled 2018-09-24: qty 3

## 2018-09-24 MED ORDER — ZOLPIDEM TARTRATE 5 MG PO TABS
5.0000 mg | ORAL_TABLET | Freq: Every evening | ORAL | Status: DC | PRN
  Administered 2018-09-26: 5 mg via ORAL
  Filled 2018-09-24: qty 1

## 2018-09-24 MED ORDER — HYDROXYZINE HCL 10 MG PO TABS
10.0000 mg | ORAL_TABLET | Freq: Three times a day (TID) | ORAL | Status: DC | PRN
  Filled 2018-09-24: qty 1

## 2018-09-24 MED ORDER — HEPARIN (PORCINE) 25000 UT/250ML-% IV SOLN
900.0000 [IU]/h | INTRAVENOUS | Status: DC
  Administered 2018-09-25: 900 [IU]/h via INTRAVENOUS
  Filled 2018-09-24: qty 250

## 2018-09-24 MED ORDER — HYDROCORTISONE NA SUCCINATE PF 100 MG IJ SOLR
50.0000 mg | Freq: Two times a day (BID) | INTRAMUSCULAR | Status: DC
  Administered 2018-09-24 – 2018-09-25 (×3): 50 mg via INTRAVENOUS
  Filled 2018-09-24: qty 1
  Filled 2018-09-24: qty 2
  Filled 2018-09-24 (×3): qty 1

## 2018-09-24 NOTE — ED Provider Notes (Signed)
[facility] EMERGENCY DEPARTMENT Provider Note   CSN: [id number] Arrival date & time: 09/24/18  1703     History   Chief Complaint Chief Complaint  Patient presents with  . Post-op Problem    HPI [name] is a 72 y.o. female.  The history is provided by the patient, the EMS personnel and medical records. No language interpreter was used.   [name] is a 72 y.o. female who presents to the Emergency Department complaining of post op problem. Level five caveat due to confusion. History is provided by EMS and medical records. Per EMS patient came from her vascular appointment following the left partial foot amputation on December 19. Patient has been complaining of increased pain to the left foot. There is concern for gangrene in the foot and she was referred to the emergency department. She was noted to be febrile at the appointment to 101 oral. On ED arrival patient complains of feeling unwell with generalized malaise and pain all over. She is unsure how long she has been feeling poorly. Past Medical History:  Diagnosis Date  . Anemia   . Arthritis   . Asthma   . Atrial fibrillation ([employer])   . Atrial flutter ([place])    a. s/p TEE/DCCV December 2013 b. 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 Bone spur of toe    Right 5th toe  . Chronic diastolic CHF (congestive heart failure) (HCC)    a. EF 50-55% by echo in 2015  . Colon polyposis   . COPD (chronic obstructive pulmonary disease) ([place])   . DVT of upper extremity (deep vein thrombosis) ([place])    Right basilic vein, June 2671  . ESRD (end stage renal disease) on dialysis ([place])    "TTF; [place]; off [employer]." (09/07/2018)  . Essential hypertension, benign   . Fractures   . Glaucoma   . Lupus ([place])   . Pericarditis     . SLE (systemic lupus erythematosus) ([place])   . Type 2 diabetes mellitus ([place])   . UTI (lower urinary tract infection)   . Wound of right leg 06/2017    Patient Active Problem List   Diagnosis Date Noted  . GERD (gastroesophageal reflux disease) 09/24/2018  . Gout 09/24/2018  . Left foot infection 09/24/2018  . Acute metabolic encephalopathy 24/58/0998  . Pressure injury of skin 09/09/2018  . S/P AKA (above knee amputation) unilateral, right ([place])   . Status post amputation of left foot through metatarsal bone ([place])   . Acute blood loss anemia   . Orthostasis   . Diabetes mellitus type 2 in nonobese (HCC)   . PAF (paroxysmal atrial fibrillation) ([place])   . PAD (peripheral artery disease) ([place]) 09/06/2018  . History of trichomonal vaginitis 08/18/2018  . Hypoglycemia 07/29/2018  . Trichomonal vaginitis 07/12/2018  . Atrial flutter ([place])   . SVT (supraventricular tachycardia) ([place])   . ESRD on dialysis ([place])   . Steroid-induced hyperglycemia   . Type II diabetes mellitus with peripheral autonomic neuropathy (HCC)   . Anemia of chronic disease   . Prediabetes   . Postoperative pain   . Ischemic leg pain   . Unilateral AKA, right ([place])   . Post-operative pain   . Diabetic retinopathy associated with type 2 diabetes mellitus ([place])   . Diabetic nephropathy associated with  type 2 diabetes mellitus ([place])   . Leukocytosis   . Non-healing wound of lower extremity 06/08/2018  . Malnutrition of moderate degree 03/03/2018  . Leg wound, right 03/01/2018  . Ulcer of right calf ([place]) 02/10/2018  . Chronic RLQ pain 01/11/2018  . Ischemic ulcer of toes on both feet ([place])   . Cellulitis in diabetic foot ([place]) 12/14/2017  . Atrial fibrillation ([place]) 12/14/2017  . Glaucoma 12/14/2017  . ESRD (end stage renal disease) on dialysis ([place]) 11/16/2017  . COPD with acute exacerbation ([place]) 11/03/2017  . Community acquired pneumonia 11/02/2017  . Nausea and vomiting 11/02/2017  . Iron deficiency  anemia 08/07/2017  . Heme positive stool 10/04/2015  . Mixed hyperlipidemia 06/29/2015  . Type 2 diabetes mellitus with stage 4 chronic kidney disease, with long-term current use of insulin ([place]) 06/29/2015  . Chronic kidney disease, stage V ([place]) 04/12/2014  . 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 Acute respiratory failure with hypoxia ([place]) 03/15/2014  . Chronic diastolic heart failure ([place]) 03/15/2014  . Anemia of chronic renal failure, stage 4 (severe) ([place]) 02/23/2014  . Folate deficiency 02/17/2014  . Aortic regurgitation 09/19/2012  . Class 2 severe obesity due to excess calories with serious comorbidity and body mass index (BMI) of 35.0 to 35.9 in adult ([place]) 09/14/2012  . Type 2 diabetes mellitus with diabetic neuropathy ([place]) 09/14/2012  . SLE (systemic lupus erythematosus) ([place]) 09/14/2012  . Essential hypertension, benign 09/14/2012  . COPD (chronic obstructive pulmonary disease) ([place]) 09/14/2012    Past Surgical History:  Procedure Laterality Date  . ABDOMINAL AORTOGRAM W/LOWER EXTREMITY N/A 12/18/2017   Procedure: ABDOMINAL AORTOGRAM W/LOWER EXTREMITY;  Surgeon: Elam Dutch, MD;  Location: [facility];  Service: Cardiovascular;  Laterality: N/A;  bilateral  . ABDOMINAL HYSTERECTOMY  1983  . AMPUTATION Right 06/08/2018   Procedure: RIGHT ABOVE KNEE AMPUTATION;  Surgeon: Elam Dutch, MD;  Location: [facility];  Service: Vascular;  Laterality: Right;  . ANKLE SURGERY  1993  . AV FISTULA PLACEMENT Left 03/27/2014   Procedure: ARTERIOVENOUS (AV) FISTULA CREATION;  Surgeon: Rosetta Posner, MD;  Location: [facility];  Service: Vascular;  Laterality: Left;  . BACK SURGERY  1980  . BASCILIC VEIN TRANSPOSITION Left 01/13/2018   Procedure: LEFT BASILIC VEIN TRANSPOSITION SECOND STAGE;  Surgeon: Elam Dutch, MD;  Location: [facility];  Service: Vascular;  Laterality: Left;  . CARDIOVASCULAR STRESS TEST  12/19/2009   no  stress induced rhythm abnormalities, ekg negative for ischemia  . CARDIOVERSION  09/16/2012   Procedure: CARDIOVERSION;  Surgeon: Sanda Klein, MD;  [facility];  Service: Cardiovascular;  Laterality: N/A;  . CATARACT EXTRACTION    . COLONOSCOPY  09/20/2012   Dr. [name]: multiple tubular adenomas   . COLONOSCOPY  2005   Dr. [name]. Polyps, path unknown   . COLONOSCOPY N/A 07/24/2016   Dr. [name]: 3 significant size polyps removed from the colon, tubular adenomas.  Next colonoscopy in October 2020.  Marland Kitchen ESOPHAGOGASTRODUODENOSCOPY  09/20/2012   Dr. [name]: duodenal erosion and possible resolving ulcer at the angularis   . ESOPHAGOGASTRODUODENOSCOPY N/A 03/24/2018   Dr. [name]: Mild erosive reflux esophagitis, erosive gastropathy and enteropathy fairly extensive, no H. pylori on biopsy.  Marland Kitchen EYE SURGERY    . IR FLUORO GUIDE CV LINE RIGHT  11/09/2017  . IR RADIOLOGIST EVAL & MGMT  09/01/2018  . IR US GUIDE VASC ACCESS RIGHT  11/09/2017  . REVISON OF ARTERIOVENOUS FISTULA Left  03/15/2018   Procedure: REVISION OF Left arm BASILIC VEIN TRANSPOSITION;  Surgeon: Angelia Mould, MD;  Location: [facility];  Service: Vascular;  Laterality: Left;  . TEE WITHOUT CARDIOVERSION  09/16/2012   Procedure: TRANSESOPHAGEAL ECHOCARDIOGRAM (TEE);  Surgeon: Sanda Klein, MD;  Location: [facility] ENDOSCOPY;  Service: Cardiovascular;  Laterality: N/A;  . TRANSESOPHAGEAL ECHOCARDIOGRAM WITH CARDIOVERSION  09/16/2012   EF 60-65%, moderate LVH, moderate regurg of the aortic valve, LA moderately dilated  . TRANSMETATARSAL AMPUTATION Left 09/06/2018   Procedure: TRANSMETATARSAL AMPUTATION;  Surgeon: Angelia Mould, MD;  Location[facility];  Service: Vascular;  Laterality: Left;     OB History    Gravida  0   Para  0   Term  0   Preterm  0   AB  0   Living  0     SAB  0   TAB  0   Ectopic  0   Multiple  0   Live Births  0            Home Medications    Prior to Admission medications    Medication Sig Start Date End Date Taking? Authorizing Provider  acetaminophen (TYLENOL) 325 MG tablet Take 1-2 tablets (325-650 mg total) by mouth every 4 (four) hours as needed for mild pain. Patient taking differently: Take 325-650 mg by mouth every 4 (four) hours as needed (for mild to moderate pain).  06/24/18  Yes Love, Ivan Anchors, PA-C  albuterol (PROVENTIL HFA;VENTOLIN HFA) 108 (90 BASE) MCG/ACT inhaler Inhale 2 puffs into the lungs every 6 (six) hours as needed for wheezing or shortness of breath.    Yes [provider]  allopurinol (ZYLOPRIM) 100 MG tablet Take 100 mg by mouth every Monday, Wednesday, and Friday.    Yes [provider]  Amino Acids-Protein Hydrolys (FEEDING SUPPLEMENT, PRO-STAT SUGAR FREE 64,) LIQD Take 30 mLs by mouth 2 (two) times daily. 12/29/17  Yes Danford, Suann Larry, MD  amiodarone (PACERONE) 200 MG tablet Take 1 tablet (200 mg total) by mouth daily. 08/04/18  Yes Strader, [place], PA-C  apixaban (ELIQUIS) 5 MG TABS tablet Take 1 tablet (5 mg total) by mouth 2 (two) times daily. 12/29/17  Yes Danford, Suann Larry, MD  atorvastatin (LIPITOR) 20 MG tablet Take 20 mg by mouth at bedtime.    Yes [provider]  brimonidine-timolol (COMBIGAN) 0.2-0.5 % ophthalmic solution Place 1 drop into the left eye 2 (two) times daily.    Yes [provider]  carbamazepine (TEGRETOL XR) 100 MG 12 hr tablet Take 100 mg by mouth 2 (two) times daily.   Yes [provider]  docusate sodium (COLACE) 100 MG capsule Take 100 mg by mouth 2 (two) times daily.   Yes [provider]  fentaNYL (DURAGESIC - DOSED MCG/HR) 25 MCG/HR patch Place 25 mcg onto the skin every 3 (three) days.   Yes [provider]  folic acid (FOLVITE) 546 MCG tablet Take 800 mcg by mouth daily.   Yes [provider]  insulin aspart (NOVOLOG FLEXPEN) 100 UNIT/ML FlexPen Inject 5 Units into the skin 3 (three) times daily with meals as needed (if BGL  is over 100).   Yes [provider]  latanoprost (XALATAN) 0.005 % ophthalmic solution Place 1 drop into both eyes at bedtime.   Yes [provider]  metoprolol tartrate (LOPRESSOR) 25 MG tablet Take 1/2 tablet twice a day on Mondays, Wednesdays and Fridays Patient taking differently: Take 12.5 mg by  mouth See admin instructions. Take 12.5 mg by mouth two times a day on Mon/Wed/Fri 06/24/18  Yes Love, Ivan Anchors, PA-C  multivitamin (RENA-VIT) TABS tablet Take 1 tablet by mouth daily.   Yes [provider]  Oxycodone HCl 10 MG TABS Take 10 mg by mouth every 6 (six) hours as needed (for moderate to severe pain).    Yes [provider]  pantoprazole (PROTONIX) 40 MG tablet Take 1 tablet (40 mg total) by mouth daily. 03/29/18  Yes Rourk, [name], MD  predniSONE (DELTASONE) 5 MG tablet Take 5 mg by mouth daily with breakfast.   Yes [provider]  calcium acetate (PHOSLO) 667 MG capsule Take 667 mg by mouth 3 (three) times daily with meals.  07/01/18   [provider]  Control Gel Formula Dressing (DUODERM CGF BORDER DRESSING EX) Place 1 patch onto the skin daily. Applied to left hip    [provider]  hydroxychloroquine (PLAQUENIL) 200 MG tablet Take 200 mg by mouth daily.  09/24/18   [provider]  travoprost, benzalkonium, (TRAVATAN) 0.004 % ophthalmic solution Place 1 drop into both eyes at bedtime.     [provider]  UNIFINE PENTIPS 31G X 6 MM MISC USE AS DIRECTED AT BEDTIME WITH LEVEMIR AND WITH SLIDING SCALE INSULIN. Patient taking differently: as directed.  09/03/18   Cassandria Anger, MD    Family History Family History  Problem Relation Age of Onset  . Diabetes Brother   . Diabetes Father   . Hypertension Father   . Hypertension Sister   . Stroke Mother   . Diabetes Sister   . Hypertension Brother   . Colon cancer Neg Hx     Social History Social History   Tobacco Use  . Smoking status: Former  Smoker    Packs/day: 1.00    Years: 30.00    Pack years: 30.00    Types: Cigarettes    Last attempt to quit: 08/29/2012    Years since quitting: 6.0  . Smokeless tobacco: Never Used  . Tobacco comment: quit 08-2012  Substance Use Topics  . Alcohol use: No    Alcohol/week: 0.0 standard drinks  . Drug use: No     Allergies   Ace inhibitors   Review of Systems Review of Systems  All other systems reviewed and are negative.    Physical Exam Updated Vital Signs BP (!) 174/50 (BP Location: Right Arm)   Pulse 81   Temp (!) 100.6 F (38.1 C) (Oral)   Resp 16   SpO2 92%   Physical Exam Vitals signs and nursing note reviewed.  Constitutional:      Appearance: She is well-developed.  HENT:     Head: Normocephalic and atraumatic.  Cardiovascular:     Rate and Rhythm: Normal rate and regular rhythm.     Heart sounds: Murmur present.  Pulmonary:     Effort: Pulmonary effort is normal. No respiratory distress.     Breath sounds: Normal breath sounds.     Comments: Vas cath in right upper chest wall Abdominal:     Palpations: Abdomen is soft.     Tenderness: There is no abdominal tenderness. There is no guarding or rebound.  Musculoskeletal:     Comments: Right AKA, left mid foot amputation with wound dehiscence. There is purulent drainage from the wound sites with necrosis of the wound margins. Drainages foul-smelling. Faint femoral pulse on the left, absent DP pulses.  Skin:    General:  Skin is warm and dry.  Neurological:     Mental Status: She is alert.     Comments: Confused. Slow to answer questions. Disoriented to place in time.  Psychiatric:        Behavior: Behavior normal.      ED Treatments / Results  Labs (all labs ordered are listed, but only abnormal results are displayed) Labs Reviewed  COMPREHENSIVE METABOLIC PANEL - Abnormal; Notable for the following components:      Result Value   Sodium 133 (*)    Chloride 94 (*)    Glucose, Bld 114 (*)     BUN 38 (*)    Creatinine, Ser 4.22 (*)    Calcium 7.8 (*)    Total Protein 6.3 (*)    Albumin 1.9 (*)    GFR calc non Af Amer 10 (*)    GFR calc Af Amer 11 (*)    All other components within normal limits  CBC WITH DIFFERENTIAL/PLATELET - Abnormal; Notable for the following components:   RBC 3.50 (*)    Hemoglobin 9.5 (*)    HCT 33.2 (*)    MCHC 28.6 (*)    RDW 18.1 (*)    Lymphs Abs 0.5 (*)    All other components within normal limits  C-REACTIVE PROTEIN - Abnormal; Notable for the following components:   CRP 13.7 (*)    All other components within normal limits  GLUCOSE, CAPILLARY - Abnormal; Notable for the following components:   Glucose-Capillary 105 (*)    All other components within normal limits  I-STAT CHEM 8, ED - Abnormal; Notable for the following components:   Sodium 132 (*)    Chloride 95 (*)    BUN 48 (*)    Creatinine, Ser 4.30 (*)    Glucose, Bld 107 (*)    Calcium, Ion 1.02 (*)    Hemoglobin 11.9 (*)    HCT 35.0 (*)    All other components within normal limits  CULTURE, BLOOD (ROUTINE X 2)  CULTURE, BLOOD (ROUTINE X 2)  URINALYSIS, ROUTINE W REFLEX MICROSCOPIC  SEDIMENTATION RATE  BASIC METABOLIC PANEL  CBC  APTT  HEPARIN LEVEL (UNFRACTIONATED)  I-STAT CG4 LACTIC ACID, ED    EKG None  Radiology Ct Head Wo Contrast  Result Date: 09/24/2018 CLINICAL DATA:  Encephalopathy EXAM: CT HEAD WITHOUT CONTRAST TECHNIQUE: Contiguous axial images were obtained from the base of the skull through the vertex without intravenous contrast. COMPARISON:  MRI 12/26/2017 FINDINGS: Brain: Small left parafalcine meningioma measuring 7 x 3 x 6 mm, stable in appearance. Patchy periventricular white matter hypodensities compatible with chronic microvascular ischemia. No large vascular territory infarct. Chronic involutional changes brain with sulcal and ventricular prominence. Intra-axial mass nor extra-axial fluid. No acute intracranial hemorrhage. Small chronic basal  ganglia lacunar infarcts are again noted. Midline fourth ventricle basal cisterns without effacement. Brainstem and cerebellum appear nonacute. Vascular: No hyperdense vessel sign. Skull: Intact Sinuses/Orbits: Bilateral lens replacements. Slight lateral deviation of gaze involving the right globe. No acute sinus disease. Other: No significant calvarial soft tissue swelling. IMPRESSION: 1. Stable small left parafalcine meningioma measuring 7 x 3 x 6 mm. 2. Chronic small vessel ischemic disease. No acute intracranial abnormality. Electronically Signed   By: Ashley Royalty M.D.   On: 09/24/2018 21:44   Dg Chest Port 1 View  Result Date: 09/24/2018 CLINICAL DATA:  Fever over diabetic foot wound. EXAM: PORTABLE CHEST 1 VIEW COMPARISON:  03/01/2018 FINDINGS: Stable cardiomegaly with aortic atherosclerosis. Right IJ approach  dialysis catheter is noted with tip in the proximal right atrium. Lungs are clear without edema or consolidation. No effusion or pneumothorax. No acute osseous appearing abnormality. IMPRESSION: No active disease.  Stable cardiomegaly with aortic atherosclerosis. Electronically Signed   By: Ashley Royalty M.D.   On: 09/24/2018 17:50    Procedures Procedures (including critical care time)  Medications Ordered in ED Medications  hydrALAZINE (APRESOLINE) injection 5 mg (has no administration in time range)  hydrOXYzine (ATARAX/VISTARIL) tablet 10 mg (has no administration in time range)  cefTRIAXone (ROCEPHIN) 1 g in sodium chloride 0.9 % 100 mL IVPB (0 g Intravenous Stopped 09/24/18 2129)  hydrocortisone sodium succinate (SOLU-CORTEF) 100 MG injection 50 mg (50 mg Intravenous Given 09/24/18 2049)  insulin aspart (novoLOG) injection 0-9 Units (has no administration in time range)  insulin aspart (novoLOG) injection 0-5 Units (has no administration in time range)  albuterol (PROVENTIL) (2.5 MG/3ML) 0.083% nebulizer solution 2.5 mg (has no administration in time range)  acetaminophen  (TYLENOL) tablet 650 mg (has no administration in time range)    Or  acetaminophen (TYLENOL) suppository 650 mg (has no administration in time range)  zolpidem (AMBIEN) tablet 5 mg (has no administration in time range)  vancomycin (VANCOCIN) IVPB 1000 mg/200 mL premix (has no administration in time range)  heparin ADULT infusion 100 units/mL (25000 units/236m sodium chloride 0.45%) (has no administration in time range)  vancomycin (VANCOCIN) IVPB 750 mg/150 ml premix (has no administration in time range)  allopurinol (ZYLOPRIM) tablet 100 mg (has no administration in time range)  oxyCODONE (Oxy IR/ROXICODONE) immediate release tablet 10 mg (has no administration in time range)  hydroxychloroquine (PLAQUENIL) tablet 200 mg (has no administration in time range)  amiodarone (PACERONE) tablet 200 mg (has no administration in time range)  atorvastatin (LIPITOR) tablet 20 mg (has no administration in time range)  metoprolol tartrate (LOPRESSOR) tablet 12.5 mg (has no administration in time range)  calcium acetate (PHOSLO) capsule 667 mg (has no administration in time range)  docusate sodium (COLACE) capsule 100 mg (has no administration in time range)  pantoprazole (PROTONIX) EC tablet 40 mg (has no administration in time range)  folic acid (FOLVITE) tablet 800 mcg (has no administration in time range)  carbamazepine (TEGRETOL XR) 12 hr tablet 100 mg (has no administration in time range)  feeding supplement (PRO-STAT SUGAR FREE 64) liquid 30 mL (has no administration in time range)  multivitamin (RENA-VIT) tablet 1 tablet (has no administration in time range)  brimonidine-timolol (COMBIGAN) 0.2-0.5 % ophthalmic solution 1 drop (has no administration in time range)  latanoprost (XALATAN) 0.005 % ophthalmic solution 1 drop (has no administration in time range)  travoprost (benzalkonium) (TRAVATAN) 0.004 % ophthalmic solution 1 drop (has no administration in time range)  piperacillin-tazobactam  (ZOSYN) IVPB 3.375 g (0 g Intravenous Stopped 09/24/18 1933)     Initial Impression / Assessment and Plan / ED Course  I have reviewed the triage vital signs and the nursing notes.  Pertinent labs & imaging results that were available during my care of the patient were reviewed by me and considered in my medical decision making (see chart for details).     Patient with history of ESR D on hemodialysis as well as recent amputation here for evaluation of fever and wound infection from vascular surgery office. Patient is unable to provide history. Records reviewed in care everywhere. Examination is consistent with wound infection. She was treated with antibiotics. Hospitalist consulted for admission for further evaluation and treatment.  Final  Clinical Impressions(s) / ED Diagnoses   Final diagnoses:  None    ED Discharge Orders    None       Quintella Reichert, MD 09/25/18 0045

## 2018-09-24 NOTE — ED Notes (Signed)
Husband at bedside, Dr. Blaine Hamper in to talk with husband

## 2018-09-24 NOTE — Progress Notes (Signed)
ANTICOAGULATION CONSULT NOTE - Initial Consult  Pharmacy Consult for hepain Indication: atrial fibrillation / DVT  Allergies  Allergen Reactions  . Ace Inhibitors Cough    Patient Measurements: TBW 60.8 kg Heparin Dosing Weight: 58 kg  Vital Signs: Temp: 100 F (37.8 C) (12/27 1708) Temp Source: Oral (12/27 1708) BP: 134/69 (12/27 1708) Pulse Rate: 71 (12/27 1708)  Labs: Recent Labs    09/24/18 1749 09/24/18 1821  HGB 9.5* 11.9*  HCT 33.2* 35.0*  PLT 192  --   CREATININE 4.22* 4.30*    Estimated Creatinine Clearance: 9.6 mL/min (A) (by C-G formula based on SCr of 4.3 mg/dL (H)).   Medical History: Past Medical History:  Diagnosis Date  . Anemia   . Arthritis   . Asthma   . Atrial fibrillation ([place])   . Atrial flutter ([place])    a. s/p TEE/DCCV December 2013 b. 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 Bone spur of toe    Right 5th toe  . Chronic diastolic CHF (congestive heart failure) (HCC)    a. EF 50-55% by echo in 2015  . Colon polyposis   . COPD (chronic obstructive pulmonary disease) ([place])   . DVT of upper extremity (deep vein thrombosis) ([place])    Right basilic vein, June 4010  . ESRD (end stage renal disease) on dialysis ([place])    "TTF; [place]; off [employer]." (09/07/2018)  . Essential hypertension, benign   . Fractures   . Glaucoma   . Lupus ([place])   . Pericarditis   . SLE (systemic lupus erythematosus) ([place])   . Type 2 diabetes mellitus ([place])   . UTI (lower urinary tract infection)   . Wound of right leg 06/2017    Assessment: 72 yo F presents with wound infection. On Eliquis PTA for DVT and Afib. Last dose was at 1000 this morning. Hgb 11.9, plts wnl.  Goal of Therapy:  Heparin level 0.3-0.7 units/ml aPTT 66-102 seconds Monitor platelets by anticoagulation protocol:  Yes   Plan:  No heparin bolus Start heparin gtt at 900 units/hr at 2200 Monitor daily aPTT / heparin level, CBC, s/s of bleed   Damia Bobrowski J 09/24/2018,8:30 PM

## 2018-09-24 NOTE — ED Triage Notes (Signed)
Pt BIB [facility] from vascular appointment, left partial foot amputation on 09/16/18, c/o increased pain to left foot pain, MD at appointment concerned pt may have gangrene to the foot. Febrile at appointment (101 oral) given 1g tylenol PTA.

## 2018-09-24 NOTE — Progress Notes (Signed)
Established Critical Limb Ischemia Patient   History of Present Illness   [name] is a 72 y.o. (04-09-1946) female who presents in follow-up for left transmetatarsal amputation by Dr. Scot Dock on 09/06/2018.  Surgical history significant for right above-the-knee amputation.  Recent arteriogram demonstrated single-vessel runoff with posterior tibial artery which occludes at the level of the ankle.  Dr. Scot Dock offered the patient a primary left above-the-knee amputation however through discussion with her family and the patient decided on having a TMA with high risk of nonhealing.  She has been in a rehab facility since discharge from the hospital.  She is having pain in her left foot and says it is oozing.  Husband says patient has been more somnolent than usual over the past couple days.  Vital signs during today's visit include a temperature of 101 degrees.  She is taking Eliquis for atrial fibrillation.  PMH also significant for ESRD on hemodialysis via R IJ TDC.   The patient's PMH, PSH, SH, and FamHx were reviewed and are unchanged from prior visit.  Current Outpatient Medications  Medication Sig Dispense Refill  . acetaminophen (TYLENOL) 325 MG tablet Take 1-2 tablets (325-650 mg total) by mouth every 4 (four) hours as needed for mild pain. (Patient taking differently: Take 1,000 mg by mouth daily as needed for mild pain or moderate pain. )    . albuterol (PROVENTIL HFA;VENTOLIN HFA) 108 (90 BASE) MCG/ACT inhaler Inhale 2 puffs into the lungs every 6 (six) hours as needed for shortness of breath.     . allopurinol (ZYLOPRIM) 100 MG tablet Take 100 mg by mouth every Monday, Wednesday, and Friday.     . Amino Acids-Protein Hydrolys (FEEDING SUPPLEMENT, PRO-STAT SUGAR FREE 64,) LIQD Take 30 mLs by mouth 2 (two) times daily. 900 mL 0  . amiodarone (PACERONE) 200 MG tablet Take 1 tablet (200 mg total) by mouth daily. 90 tablet 3  . apixaban (ELIQUIS) 5 MG TABS tablet Take 1 tablet (5  mg total) by mouth 2 (two) times daily. 60 tablet 6  . atorvastatin (LIPITOR) 20 MG tablet Take 20 mg by mouth at bedtime.     . B Complex-C-Folic Acid (DIALYVITE 696) 0.8 MG TABS Take 1 tablet by mouth daily.    . brimonidine-timolol (COMBIGAN) 0.2-0.5 % ophthalmic solution Place 1 drop into the left eye 2 (two) times daily.     . calcium acetate (PHOSLO) 667 MG capsule Take 667 mg by mouth 3 (three) times daily with meals.   4  . Control Gel Formula Dressing (DUODERM CGF BORDER DRESSING EX) Place 1 patch onto the skin daily. Applied to left hip    . fentaNYL (DURAGESIC - DOSED MCG/HR) 50 MCG/HR Place 25 mcg onto the skin every 3 (three) days.     . folic acid (FOLVITE) 1 MG tablet Take 1 mg by mouth daily.    . hydroxychloroquine (PLAQUENIL) 200 MG tablet Take 200 mg by mouth daily.     . insulin lispro (HUMALOG KWIKPEN) 100 UNIT/ML KiwkPen Inject 5 Units into the skin 3 (three) times daily with meals as needed (*Only take if blood sugar levels are over 100).     . metoprolol tartrate (LOPRESSOR) 25 MG tablet Take 1/2 tablet twice a day on Mondays, Wednesdays and Fridays (Patient taking differently: Take 12.5 mg by mouth See admin instructions. Take 12.5 mg twice a day on Mondays, Wednesdays and Fridays) 30 tablet 0  . Oxycodone HCl 10 MG TABS Take 10 mg  by mouth every 6 (six) hours as needed (pain.).     Marland Kitchen pantoprazole (PROTONIX) 40 MG tablet Take 1 tablet (40 mg total) by mouth daily. 30 tablet 11  . predniSONE (DELTASONE) 5 MG tablet Take 5 mg by mouth daily with breakfast.    . travoprost, benzalkonium, (TRAVATAN) 0.004 % ophthalmic solution Place 1 drop into both eyes at bedtime.     Marland Kitchen UNIFINE PENTIPS 31G X 6 MM MISC USE AS DIRECTED AT BEDTIME WITH LEVEMIR AND WITH SLIDING SCALE INSULIN. 100 each 0   No current facility-administered medications for this visit.     On ROS today: 10 system ROS is negative unless otherwise noted in HPI   Physical Examination   Vitals:   09/24/18 1452    BP: (!) 134/54  Pulse: 75  Resp: 20  Temp: (!) 101 F (38.3 C)  SpO2: 98%  Weight: 134 lb (60.8 kg)  Height: 5' (1.524 m)   Body mass index is 26.17 kg/m.  General Somnolent on exam  Pulmonary Sym exp, good B air movt,   Cardiac RRR, Nl S1, S2,  Vascular Vessel Right Left  Radial Palpable Palpable  Popliteal AKA Not palpable  PT AKA Not palpable  DP AKA Not palpable    Gastro- intestinal soft, non-distended, non-tender to palpation,   Musculo- skeletal M/S 5/5 throughout; left TMA site nonhealing with purulent drainage boggy to touch; darkening forefoot; foul odor  Neurologic Somnolent     Medical Decision Making   [name] is a 72 y.o. female who presents with nonhealing L TMA now with fever    Non healing L TMA with concern for systemic infection  No revascularization options for this patient based on previous arteriogram  Plan is for direct admission to hospital on the [facility] service for IV antibiotics  Hold Eliquis  Patient is posted for L AKA by Dr. Donnetta Hutching on Sunday 09/26/18  Husband present during visit today and is agreeable to this plan  Dagoberto Ligas PA-C [facility] Office: [phone]

## 2018-09-24 NOTE — Progress Notes (Signed)
Pharmacy Antibiotic Note  [name] is a 72 y.o. female admitted on 09/24/2018 with sepsis.  Pharmacy has been consulted for vancomycin dosing.  PMH includes ESRD on HD-TTS. Plan for L AKA on 12/29  Plan: Give vancomycin 1g IV x 1, then start vancomycin 750mg  IV QHD-TTS Continue ceftriaxone 1g IV Q24h per MD Monitor clinical picture, renal function, vanc levels prn F/U C&S, abx deescalation / LOT   Temp (24hrs), Avg:100.5 F (38.1 C), Min:100 F (37.8 C), Max:101 F (38.3 C)  Recent Labs  Lab 09/24/18 1749 09/24/18 1821 09/24/18 1822  WBC 7.1  --   --   CREATININE 4.22* 4.30*  --   LATICACIDVEN  --   --  1.35    Estimated Creatinine Clearance: 9.6 mL/min (A) (by C-G formula based on SCr of 4.3 mg/dL (H)).    Allergies  Allergen Reactions  . Ace Inhibitors Cough    Thank you for allowing pharmacy to be a part of this patient's care.  Reginia Naas 09/24/2018 8:46 PM

## 2018-09-24 NOTE — H&P (Signed)
History and Physical    [name] IEP:[id number] DOB: 1946/01/08 DOA: 09/24/2018  Referring MD/NP/PA:   PCP: Sharilyn Sites, MD   Patient coming from:  The patient is coming from rehab  At baseline, pt is dependent for most of ADL.        Chief Complaint: left foot pain and AMS.  HPI: [name] is a 72 y.o. female with medical history significant of hypertension, hyperlipidemia, diabetes mellitus, COPD, GERD, gout, and anemia, atrial fibrillation and DVT on Eliquis, d had his annual return call to stepdown bed CHF, ESRD-HD (TTS), lupus, PAD, s/p of R AKA, who presents with left foot pain.  Per report, pt had left transmetatarsal amputation by Dr. Rachelle Hora on 09/06/18. She has worsening pain in the left foot. She also has oozing. She has fever of 101 and chills. Patient complains of feeling unwell with generalized malaise and pain all over. Pt was seen in [facility] office today. There is concern for gangrene in the foot and she was referred to the emergency department. Per her husband, patient has confused in the past several days.  No fall.  No unilateral tingling in extremities, vision change or hearing loss.  Patient does not have active cough, shortness of breath, nausea vomiting, diarrhea.  No chest pain or abdominal pain.  ED Course: pt was found to have WBC 7.1, lactic acid of 1.35, potassium 3.8, bicarbonate 31, creatinine 4.30, BUN 48, temperature 100, heart rate 71, oxygen saturation 98% on room air.  Chest x-ray negative.  CT head is negative for acute intracranial abnormalities.  Patient is admitted to telemetry bed as inpatient.  Review of Systems: Could not be reviewed accurately due to altered mental status.  Allergy:  Allergies  Allergen Reactions  . Ace Inhibitors Cough    Past Medical History:  Diagnosis Date  . Anemia   . Arthritis   . Asthma   . Atrial fibrillation ([place])   . Atrial flutter ([place])    a. s/p TEE/DCCV December 2013 b. recurrent episodes  since and also documented during admission in 05/2018 and by monitor in 06/2018 --> on Eliquis for anticoagulation - previously on Coumadin but discontinued by Nephrology due to calciphylaxis  . Bone spur of toe    Right 5th toe  . Chronic diastolic CHF (congestive heart failure) (HCC)    a. EF 50-55% by echo in 2015  . Colon polyposis   . COPD (chronic obstructive pulmonary disease) ([place])   . DVT of upper extremity (deep vein thrombosis) ([place])    Right basilic vein, June 6606  . ESRD (end stage renal disease) on dialysis ([place])    "TTF; [place]; off [employer]." (09/07/2018)  . Essential hypertension, benign   . Fractures   . Glaucoma   . Lupus ([place])   . Pericarditis   . SLE (systemic lupus erythematosus) ([place])   . Type 2 diabetes mellitus ([place])   . UTI (lower urinary tract infection)   . Wound of right leg 06/2017    Past Surgical History:  Procedure Laterality Date  . ABDOMINAL AORTOGRAM W/LOWER EXTREMITY N/A 12/18/2017   Procedure: ABDOMINAL AORTOGRAM W/LOWER EXTREMITY;  Surgeon: Elam Dutch, MD;  Location: [facility];  Service: Cardiovascular;  Laterality: N/A;  bilateral  . ABDOMINAL HYSTERECTOMY  1983  . AMPUTATION Right 06/08/2018   Procedure: RIGHT ABOVE KNEE AMPUTATION;  Surgeon: Elam Dutch, MD;  Location: [facility];  Service: Vascular;  Laterality: Right;  . ANKLE SURGERY  1993  .  AV FISTULA PLACEMENT Left 03/27/2014   Procedure: ARTERIOVENOUS (AV) FISTULA CREATION;  Surgeon: Rosetta Posner, MD;  Location: [facility];  Service: Vascular;  Laterality: Left;  . BACK SURGERY  1980  . BASCILIC VEIN TRANSPOSITION Left 01/13/2018   Procedure: LEFT BASILIC VEIN TRANSPOSITION SECOND STAGE;  Surgeon: Elam Dutch, MD;  Location: [facility];  Service: Vascular;  Laterality: Left;  . CARDIOVASCULAR STRESS TEST  12/19/2009   no stress induced rhythm abnormalities, ekg negative for ischemia  . CARDIOVERSION  09/16/2012   Procedure: CARDIOVERSION;  Surgeon: Sanda Klein,  MD;  Location: [facility];  Service: Cardiovascular;  Laterality: N/A;  . CATARACT EXTRACTION    . COLONOSCOPY  09/20/2012   Dr. [name]: multiple tubular adenomas   . COLONOSCOPY  2005   Dr. [name]. Polyps, path unknown   . COLONOSCOPY N/A 07/24/2016   Dr. [name]: 3 significant size polyps removed from the colon, tubular adenomas.  Next colonoscopy in October 2020.  Marland Kitchen ESOPHAGOGASTRODUODENOSCOPY  09/20/2012   Dr. [name]: duodenal erosion and possible resolving ulcer at the angularis   . ESOPHAGOGASTRODUODENOSCOPY N/A 03/24/2018   Dr. [name]: Mild erosive reflux esophagitis, erosive gastropathy and enteropathy fairly extensive, no H. pylori on biopsy.  Marland Kitchen EYE SURGERY    . IR FLUORO GUIDE CV LINE RIGHT  11/09/2017  . IR RADIOLOGIST EVAL & MGMT  09/01/2018  . IR US GUIDE VASC ACCESS RIGHT  11/09/2017  . REVISON OF ARTERIOVENOUS FISTULA Left 03/15/2018   Procedure: REVISION OF Left arm BASILIC VEIN TRANSPOSITION;  Surgeon: Angelia Mould, MD;  Location: [facility];  Service: Vascular;  Laterality: Left;  . TEE WITHOUT CARDIOVERSION  09/16/2012   Procedure: TRANSESOPHAGEAL ECHOCARDIOGRAM (TEE);  Surgeon: Sanda Klein, MD;  Location: [facility];  Service: Cardiovascular;  Laterality: N/A;  . TRANSESOPHAGEAL ECHOCARDIOGRAM WITH CARDIOVERSION  09/16/2012   EF 60-65%, moderate LVH, moderate regurg of the aortic valve, LA moderately dilated  . TRANSMETATARSAL AMPUTATION Left 09/06/2018   Procedure: TRANSMETATARSAL AMPUTATION;  Surgeon: Angelia Mould, MD;  Location: [facility] OR;  Service: Vascular;  Laterality: Left;    Social History:  reports that she quit smoking about 6 years ago. Her smoking use included cigarettes. She has a 30.00 pack-year smoking history. She has never used smokeless tobacco. She reports that she does not drink alcohol or use drugs.  Family History:  Family History  Problem Relation Age of Onset  . Diabetes Brother   . Diabetes Father   . Hypertension Father   .  Hypertension Sister   . Stroke Mother   . Diabetes Sister   . Hypertension Brother   . Colon cancer Neg Hx      Prior to Admission medications   Medication Sig Start Date End Date Taking? Authorizing Provider  acetaminophen (TYLENOL) 325 MG tablet Take 1-2 tablets (325-650 mg total) by mouth every 4 (four) hours as needed for mild pain. Patient taking differently: Take 1,000 mg by mouth daily as needed for mild pain or moderate pain.  06/24/18   Love, Ivan Anchors, PA-C  albuterol (PROVENTIL HFA;VENTOLIN HFA) 108 (90 BASE) MCG/ACT inhaler Inhale 2 puffs into the lungs every 6 (six) hours as needed for shortness of breath.     [provider]  allopurinol (ZYLOPRIM) 100 MG tablet Take 100 mg by mouth every Monday, Wednesday, and Friday.     [provider]  Amino Acids-Protein Hydrolys (FEEDING SUPPLEMENT, PRO-STAT SUGAR FREE 64,) LIQD Take 30 mLs by mouth 2 (two) times daily. 12/29/17  Danford, Suann Larry, MD  amiodarone (PACERONE) 200 MG tablet Take 1 tablet (200 mg total) by mouth daily. 08/04/18   Strader, Fransisco Hertz, PA-C  apixaban (ELIQUIS) 5 MG TABS tablet Take 1 tablet (5 mg total) by mouth 2 (two) times daily. 12/29/17   Danford, Suann Larry, MD  atorvastatin (LIPITOR) 20 MG tablet Take 20 mg by mouth at bedtime.     [provider]  B Complex-C-Folic Acid (DIALYVITE 468) 0.8 MG TABS Take 1 tablet by mouth daily.    [provider]  brimonidine-timolol (COMBIGAN) 0.2-0.5 % ophthalmic solution Place 1 drop into the left eye 2 (two) times daily.     [provider]  calcium acetate (PHOSLO) 667 MG capsule Take 667 mg by mouth 3 (three) times daily with meals.  07/01/18   [provider]  Control Gel Formula Dressing (DUODERM CGF BORDER DRESSING EX) Place 1 patch onto the skin daily. Applied to left hip    [provider]  fentaNYL (DURAGESIC - DOSED MCG/HR) 50 MCG/HR Place 25 mcg onto the skin every 3 (three) days.     [provider]  folic acid (FOLVITE) 1 MG tablet Take 1 mg by mouth daily.    [provider]  hydroxychloroquine (PLAQUENIL) 200 MG tablet Take 200 mg by mouth daily.     [provider]  insulin lispro (HUMALOG KWIKPEN) 100 UNIT/ML KiwkPen Inject 5 Units into the skin 3 (three) times daily with meals as needed (*Only take if blood sugar levels are over 100).     [provider]  metoprolol tartrate (LOPRESSOR) 25 MG tablet Take 1/2 tablet twice a day on Mondays, Wednesdays and Fridays Patient taking differently: Take 12.5 mg by mouth See admin instructions. Take 12.5 mg twice a day on Mondays, Wednesdays and Fridays 06/24/18   Love, Ivan Anchors, PA-C  Oxycodone HCl 10 MG TABS Take 10 mg by mouth every 6 (six) hours as needed (pain.).     [provider]  pantoprazole (PROTONIX) 40 MG tablet Take 1 tablet (40 mg total) by mouth daily. 03/29/18   Rourk, [name], MD  predniSONE (DELTASONE) 5 MG tablet Take 5 mg by mouth daily with breakfast.    [provider]  travoprost, benzalkonium, (TRAVATAN) 0.004 % ophthalmic solution Place 1 drop into both eyes at bedtime.     [provider]  UNIFINE PENTIPS 31G X 6 MM MISC USE AS DIRECTED AT BEDTIME WITH LEVEMIR AND WITH SLIDING SCALE INSULIN. Patient taking differently: as directed.  09/03/18   Cassandria Anger, MD    Physical Exam: Vitals:   09/24/18 2100 09/24/18 2151 09/25/18 0105 09/25/18 0505  BP: (!) 187/44 (!) 174/50 (!) 187/36 (!) 139/38  Pulse:  81 78 69  Resp: '16 16 12 14  ' Temp:  (!) 100.6 F (38.1 C) 100.1 F (37.8 C) 98.6 F (37 C)  TempSrc:  Oral Oral Oral  SpO2:  92% 100% 99%   General: Not in acute distress HEENT:       Eyes: PERRL, EOMI, no scleral icterus.       ENT: No discharge from the ears and nose, no pharynx injection, no tonsillar enlargement.        Neck: No JVD, no bruit, no mass felt. Heme: No neck lymph node enlargement. Cardiac: S1/S2, RRR, No murmurs, No  gallops or rubs. Respiratory:  No rales, wheezing, rhonchi or rubs. GI: Soft, nondistended, nontender, No organomegaly, BS present. GU: No hematuria Ext: has  trace edema in left leg. S/p of R AKA. Musculoskeletal: No joint deformities, No joint redness or warmth, no limitation of ROM in spin. Skin: No rashes. left mid foot amputation with wound dehiscence. There is purulent drainage from the wound sites with necrosis of the wound margins. Drainages foul-smelling.   Neuro: confused, knows her own name, but not oriented to place and time. cranial nerves II-XII grossly intact, moves all extremities normally. Psych: Patient is not psychotic, no suicidal or hemocidal ideation.  Labs on Admission: I have personally reviewed following labs and imaging studies  CBC: Recent Labs  Lab 09/24/18 1749 09/24/18 1821  WBC 7.1  --   NEUTROABS 5.9  --   HGB 9.5* 11.9*  HCT 33.2* 35.0*  MCV 94.9  --   PLT 192  --    Basic Metabolic Panel: Recent Labs  Lab 09/24/18 1749 09/24/18 1821  NA 133* 132*  K 3.6 3.8  CL 94* 95*  CO2 31  --   GLUCOSE 114* 107*  BUN 38* 48*  CREATININE 4.22* 4.30*  CALCIUM 7.8*  --    GFR: Estimated Creatinine Clearance: 9.6 mL/min (A) (by C-G formula based on SCr of 4.3 mg/dL (H)). Liver Function Tests: Recent Labs  Lab 09/24/18 1749  AST 31  ALT 9  ALKPHOS 72  BILITOT 0.6  PROT 6.3*  ALBUMIN 1.9*   No results for input(s): LIPASE, AMYLASE in the last 168 hours. No results for input(s): AMMONIA in the last 168 hours. Coagulation Profile: No results for input(s): INR, PROTIME in the last 168 hours. Cardiac Enzymes: No results for input(s): CKTOTAL, CKMB, CKMBINDEX, TROPONINI in the last 168 hours. BNP (last 3 results) No results for input(s): PROBNP in the last 8760 hours. HbA1C: No results for input(s): HGBA1C in the last 72 hours. CBG: Recent Labs  Lab 09/25/18 0032  GLUCAP 105*   Lipid Profile: No results for input(s): CHOL, HDL, LDLCALC,  TRIG, CHOLHDL, LDLDIRECT in the last 72 hours. Thyroid Function Tests: No results for input(s): TSH, T4TOTAL, FREET4, T3FREE, THYROIDAB in the last 72 hours. Anemia Panel: No results for input(s): VITAMINB12, FOLATE, FERRITIN, TIBC, IRON, RETICCTPCT in the last 72 hours. Urine analysis:    Component Value Date/Time   COLORURINE AMBER (A) 11/19/2017 0300   APPEARANCEUR HAZY (A) 11/19/2017 0300   LABSPEC 1.021 11/19/2017 0300   PHURINE 5.0 11/19/2017 0300   GLUCOSEU NEGATIVE 11/19/2017 0300   HGBUR NEGATIVE 11/19/2017 0300   BILIRUBINUR NEGATIVE 11/19/2017 0300   KETONESUR NEGATIVE 11/19/2017 0300   PROTEINUR NEGATIVE 11/19/2017 0300   UROBILINOGEN 1.0 03/17/2014 1830   NITRITE NEGATIVE 11/19/2017 0300   LEUKOCYTESUR MODERATE (A) 11/19/2017 0300   Sepsis Labs: '@LABRCNTIP' (procalcitonin:4,lacticidven:4) )No results found for this or any previous visit (from the past 240 hour(s)).   Radiological Exams on Admission: Ct Head Wo Contrast  Result Date: 09/24/2018 CLINICAL DATA:  Encephalopathy EXAM: CT HEAD WITHOUT CONTRAST TECHNIQUE: Contiguous axial images were obtained from the base of the skull through the vertex without intravenous contrast. COMPARISON:  MRI 12/26/2017 FINDINGS: Brain: Small left parafalcine meningioma measuring 7 x 3 x 6 mm, stable in appearance. Patchy periventricular white matter hypodensities compatible with chronic microvascular ischemia. No large vascular territory infarct. Chronic involutional changes brain with sulcal and ventricular prominence. Intra-axial mass nor extra-axial fluid. No acute intracranial hemorrhage. Small chronic basal ganglia lacunar infarcts are again noted. Midline fourth ventricle basal cisterns without effacement. Brainstem and cerebellum appear nonacute. Vascular: No hyperdense vessel sign. Skull: Intact Sinuses/Orbits: Bilateral  lens replacements. Slight lateral deviation of gaze involving the right globe. No acute sinus disease. Other: No  significant calvarial soft tissue swelling. IMPRESSION: 1. Stable small left parafalcine meningioma measuring 7 x 3 x 6 mm. 2. Chronic small vessel ischemic disease. No acute intracranial abnormality. Electronically Signed   By: Ashley Royalty M.D.   On: 09/24/2018 21:44   Dg Chest Port 1 View  Result Date: 09/24/2018 CLINICAL DATA:  Fever over diabetic foot wound. EXAM: PORTABLE CHEST 1 VIEW COMPARISON:  03/01/2018 FINDINGS: Stable cardiomegaly with aortic atherosclerosis. Right IJ approach dialysis catheter is noted with tip in the proximal right atrium. Lungs are clear without edema or consolidation. No effusion or pneumothorax. No acute osseous appearing abnormality. IMPRESSION: No active disease.  Stable cardiomegaly with aortic atherosclerosis. Electronically Signed   By: Ashley Royalty M.D.   On: 09/24/2018 17:50     EKG: Independently reviewed.  Sinus rhythm, QTC 509, LVH, poor R wave progression, LAD, nonspecific T wave change.  Assessment/Plan Principal Problem:   Left foot infection Active Problems:   SLE (systemic lupus erythematosus) (HCC)   Essential hypertension, benign   COPD (chronic obstructive pulmonary disease) (HCC)   Chronic diastolic heart failure (HCC)   Mixed hyperlipidemia   Atrial fibrillation (HCC)   Type II diabetes mellitus with peripheral autonomic neuropathy (HCC)   ESRD on dialysis (HCC)   S/P AKA (above knee amputation) unilateral, right (HCC)   Status post amputation of left foot through metatarsal bone (HCC)   GERD (gastroesophageal reflux disease)   Gout   Acute metabolic encephalopathy    Left foot wound infection: pt has non healing left foot wound with infection after transmetatarsal amputation. Pt has fever, but no leukocytosis.  Currently hemodynamically stable.  Per VVS, Planing to do L AKA by Dr. Donnetta Hutching on Sunday 09/26/18.   -will admit to tele bed as inpt -switch Eliquis to IV heparin for DVT and A fib - Empiric antimicrobial treatment with  vancomycin and Rocephin (patient received 1 dose of Zosyn in ED) - Blood cultures x 2  - ESR and CRP - wound care consult - INR/PTT/type & screen  SLE (systemic lupus erythematosus) ([place]): pt is supposed to start Plaquenil on 12/27 -Start Plaquenil -continu prednisone, increase the dose from 5 to 10 mg daily -Stress dose Solu-Cortef 50 mg twice daily  HTN:  -Continue home medications: Metoprolol -IV hydralazine prn  COPD (chronic obstructive pulmonary disease) ([place]): per floor RN report, pt was noted to have productive cough with streaks of blood.  That time IV heparin was not started yet.  Chest x-ray negative.  Patient may have mild COPD exacerbation -DuoNeb nebulizers, PRN albuterol nebulizers, -Patient is on antibiotics as above -PRN Mucinex for cough, -Follow-up sputum culture and blood culture -Patient is on Solu-Cortef as above  Chronic diastolic heart failure ([place]): 2D echo on 11/03/2017 showed EF of 50-60% with grade 2 diastolic dysfunction.  Patient has trace leg edema.  CHF seems to be compensated. -Volume management per renal, dialysis  Mixed hyperlipidemia: -Lipitor  Atrial Fibrillation: CHA2DS2-VASc Score is 6, needs oral anticoagulation. Patient is Eliquis at home. Heart rate is well controlled. -Continue metoprolol - Eliquis is switched to IV heparin as above  Type II diabetes mellitus with peripheral autonomic neuropathy ([place]): Last A1c 4.5 on 09/06/18, well controled. Patient is taking Humalog at home -SSI  ESRD on dialysis (TTF): -please call renal for HD  GERD: -Protonix  Gout: -continue home allopurinol  Acute metabolic encephalopathy: Likely due to  ongoing infection.  CT head is negative for acute intracranial abnormalities.  No focal neurologic findings on physical examination. -Frequent neuro check    # Patient requires inpatient status due to high intensity of service, high risk for further deterioration and high frequency of surveillance  required.  I certify that at the point of admission it is my clinical judgment that the patient will require inpatient hospital care spanning beyond 2 midnights from the point of admission.  . This patient has multiple chronic comorbidities including hypertension, hyperlipidemia, diabetes mellitus, COPD, GERD, gout, and anemia, atrial fibrillation and DVT on Eliquis, d had his annual return call to stepdown bed CHF, ESRD-HD (TTS), lupus, PAD, s/p of R AKA. . Now patient has presenting symptoms include left foot wound infection and AMS . The worrisome physical exam findings: left mid foot amputation with wound dehiscence. There is purulent drainage from the wound sites with necrosis of the wound margins. Drainages foul-smelling. Pt also has AMS, fever. . Current medical needs: please see my assessment and plan . Predictability of an adverse outcome (risk): Patient has multiple comorbidities, who presents with left foot wound infection after partial amputation.  Patient will need L AKA by Dr. Donnetta Hutching on Sunday 09/26/18.  Patient will need to stay in hospital for more than 2 days.   DVT ppx: on IV Heparin    Code Status: Full code Family Communication: Yes, patient's husband at bed side Disposition Plan:  Anticipate discharge back to previous rehab Consults called: none  Admission status:   Inpatient/tele       Date of Service 09/25/2018    Ivor Costa [facility] Pager [phone]  If 7PM-7AM, please contact night-coverage [email or web address] Password [facility] 09/25/2018, 7:13 AM

## 2018-09-24 NOTE — ED Notes (Signed)
Pt has foul smelling drainage from left foot at incision site

## 2018-09-25 ENCOUNTER — Inpatient Hospital Stay (HOSPITAL_COMMUNITY): Payer: Medicare Other

## 2018-09-25 ENCOUNTER — Other Ambulatory Visit: Payer: Self-pay

## 2018-09-25 LAB — BASIC METABOLIC PANEL
Anion gap: 14 (ref 5–15)
BUN: 49 mg/dL — ABNORMAL HIGH (ref 8–23)
CALCIUM: 7.6 mg/dL — AB (ref 8.9–10.3)
CO2: 25 mmol/L (ref 22–32)
Chloride: 94 mmol/L — ABNORMAL LOW (ref 98–111)
Creatinine, Ser: 4.91 mg/dL — ABNORMAL HIGH (ref 0.44–1.00)
GFR calc Af Amer: 10 mL/min — ABNORMAL LOW (ref >60)
GFR, EST NON AFRICAN AMERICAN: 8 mL/min — AB (ref >60)
GLUCOSE: 144 mg/dL — AB (ref 70–99)
Potassium: 3.7 mmol/L (ref 3.5–5.1)
Sodium: 133 mmol/L — ABNORMAL LOW (ref 135–145)

## 2018-09-25 LAB — APTT
aPTT: >200 seconds (ref 24–36)
aPTT: >200 seconds (ref 24–36)
aPTT: 69 seconds — ABNORMAL HIGH (ref 24–36)

## 2018-09-25 LAB — GLUCOSE, CAPILLARY
GLUCOSE-CAPILLARY: 149 mg/dL — AB (ref 70–99)
GLUCOSE-CAPILLARY: 134 mg/dL — AB (ref 70–99)
Glucose-Capillary: 105 mg/dL — ABNORMAL HIGH (ref 70–99)
Glucose-Capillary: 138 mg/dL — ABNORMAL HIGH (ref 70–99)

## 2018-09-25 LAB — RENAL FUNCTION PANEL
Albumin: 1.6 g/dL — ABNORMAL LOW (ref 3.5–5.0)
Anion gap: 13 (ref 5–15)
BUN: 54 mg/dL — ABNORMAL HIGH (ref 8–23)
CO2: 24 mmol/L (ref 22–32)
Calcium: 7.6 mg/dL — ABNORMAL LOW (ref 8.9–10.3)
Chloride: 95 mmol/L — ABNORMAL LOW (ref 98–111)
Creatinine, Ser: 5.11 mg/dL — ABNORMAL HIGH (ref 0.44–1.00)
GFR calc Af Amer: 9 mL/min — ABNORMAL LOW (ref >60)
GFR calc non Af Amer: 8 mL/min — ABNORMAL LOW (ref >60)
Glucose, Bld: 162 mg/dL — ABNORMAL HIGH (ref 70–99)
Phosphorus: 6.9 mg/dL — ABNORMAL HIGH (ref 2.5–4.6)
Potassium: 4.2 mmol/L (ref 3.5–5.1)
Sodium: 132 mmol/L — ABNORMAL LOW (ref 135–145)

## 2018-09-25 LAB — HEPARIN LEVEL (UNFRACTIONATED)
HEPARIN UNFRACTIONATED: 1.32 [IU]/mL — AB (ref 0.30–0.70)
Heparin Unfractionated: >2.2 IU/mL — ABNORMAL HIGH (ref 0.30–0.70)
Heparin Unfractionated: >2.2 IU/mL — ABNORMAL HIGH (ref 0.30–0.70)

## 2018-09-25 LAB — CBC
HCT: 28.3 % — ABNORMAL LOW (ref 36.0–46.0)
Hemoglobin: 8.2 g/dL — ABNORMAL LOW (ref 12.0–15.0)
MCH: 27.1 pg (ref 26.0–34.0)
MCHC: 29 g/dL — ABNORMAL LOW (ref 30.0–36.0)
MCV: 93.4 fL (ref 80.0–100.0)
Platelets: 169 10*3/uL (ref 150–400)
RBC: 3.03 MIL/uL — ABNORMAL LOW (ref 3.87–5.11)
RDW: 17.9 % — ABNORMAL HIGH (ref 11.5–15.5)
WBC: 13 10*3/uL — ABNORMAL HIGH (ref 4.0–10.5)
nRBC: 0 % (ref 0.0–0.2)

## 2018-09-25 LAB — SEDIMENTATION RATE: Sed Rate: 55 mm/hr — ABNORMAL HIGH (ref 0–22)

## 2018-09-25 LAB — C-REACTIVE PROTEIN: CRP: 13.7 mg/dL — ABNORMAL HIGH (ref <1.0)

## 2018-09-25 MED ORDER — AMIODARONE HCL 100 MG PO TABS
200.0000 mg | ORAL_TABLET | Freq: Every day | ORAL | Status: DC
  Administered 2018-09-25 – 2018-09-30 (×6): 200 mg via ORAL
  Filled 2018-09-25 (×7): qty 2

## 2018-09-25 MED ORDER — METOPROLOL TARTRATE 12.5 MG HALF TABLET
12.5000 mg | ORAL_TABLET | ORAL | Status: DC
  Administered 2018-09-27 – 2018-09-29 (×4): 12.5 mg via ORAL
  Filled 2018-09-25 (×3): qty 1

## 2018-09-25 MED ORDER — CLOTRIMAZOLE 10 MG MT TROC
10.0000 mg | Freq: Every day | OROMUCOSAL | Status: DC
  Administered 2018-09-25 – 2018-09-30 (×24): 10 mg via ORAL
  Filled 2018-09-25 (×26): qty 1

## 2018-09-25 MED ORDER — ALLOPURINOL 100 MG PO TABS
100.0000 mg | ORAL_TABLET | ORAL | Status: DC
  Administered 2018-09-27 – 2018-09-29 (×2): 100 mg via ORAL
  Filled 2018-09-25 (×3): qty 1

## 2018-09-25 MED ORDER — FOLIC ACID 1 MG PO TABS
1000.0000 ug | ORAL_TABLET | Freq: Every day | ORAL | Status: DC
  Administered 2018-09-25: 1 mg via ORAL
  Filled 2018-09-25: qty 1

## 2018-09-25 MED ORDER — RENA-VITE PO TABS
1.0000 | ORAL_TABLET | Freq: Every day | ORAL | Status: DC
  Administered 2018-09-25 – 2018-10-01 (×6): 1 via ORAL
  Filled 2018-09-25 (×7): qty 1

## 2018-09-25 MED ORDER — BRIMONIDINE TARTRATE-TIMOLOL 0.2-0.5 % OP SOLN: 1.0000 [drp] | Freq: Two times a day (BID) | OPHTHALMIC | Status: DC

## 2018-09-25 MED ORDER — HEPARIN SODIUM (PORCINE) 1000 UNIT/ML DIALYSIS
1000.0000 [IU] | INTRAMUSCULAR | Status: DC | PRN
  Administered 2018-09-25: 1000 [IU] via INTRAVENOUS_CENTRAL
  Filled 2018-09-25 (×2): qty 1

## 2018-09-25 MED ORDER — DOCUSATE SODIUM 100 MG PO CAPS
100.0000 mg | ORAL_CAPSULE | Freq: Two times a day (BID) | ORAL | Status: DC
  Administered 2018-09-25 – 2018-09-30 (×10): 100 mg via ORAL
  Filled 2018-09-25 (×14): qty 1

## 2018-09-25 MED ORDER — OXYCODONE HCL 5 MG PO TABS
10.0000 mg | ORAL_TABLET | Freq: Four times a day (QID) | ORAL | Status: DC | PRN
  Administered 2018-09-25 – 2018-10-01 (×11): 10 mg via ORAL
  Filled 2018-09-25 (×12): qty 2

## 2018-09-25 MED ORDER — IPRATROPIUM-ALBUTEROL 0.5-2.5 (3) MG/3ML IN SOLN
3.0000 mL | Freq: Four times a day (QID) | RESPIRATORY_TRACT | Status: DC
  Administered 2018-09-25: 3 mL via RESPIRATORY_TRACT
  Filled 2018-09-25: qty 3

## 2018-09-25 MED ORDER — CALCIUM ACETATE (PHOS BINDER) 667 MG PO CAPS
667.0000 mg | ORAL_CAPSULE | Freq: Three times a day (TID) | ORAL | Status: DC
  Administered 2018-09-25 – 2018-10-08 (×24): 667 mg via ORAL
  Filled 2018-09-25 (×27): qty 1

## 2018-09-25 MED ORDER — ATORVASTATIN CALCIUM 10 MG PO TABS
20.0000 mg | ORAL_TABLET | Freq: Every day | ORAL | Status: DC
  Administered 2018-09-25 – 2018-09-30 (×7): 20 mg via ORAL
  Filled 2018-09-25 (×8): qty 2

## 2018-09-25 MED ORDER — PANTOPRAZOLE SODIUM 40 MG PO TBEC
40.0000 mg | DELAYED_RELEASE_TABLET | Freq: Every day | ORAL | Status: DC
  Administered 2018-09-25 – 2018-10-01 (×7): 40 mg via ORAL
  Filled 2018-09-25 (×7): qty 1

## 2018-09-25 MED ORDER — SODIUM CHLORIDE 0.9 % IV SOLN: 100.0000 mL | INTRAVENOUS | Status: DC | PRN

## 2018-09-25 MED ORDER — CARBAMAZEPINE ER 100 MG PO TB12
100.0000 mg | ORAL_TABLET | Freq: Two times a day (BID) | ORAL | Status: DC
  Administered 2018-09-25 – 2018-09-30 (×12): 100 mg via ORAL
  Filled 2018-09-25 (×15): qty 1

## 2018-09-25 MED ORDER — VANCOMYCIN HCL IN DEXTROSE 750-5 MG/150ML-% IV SOLN
INTRAVENOUS | Status: AC
  Administered 2018-09-25: 750 mg via INTRAVENOUS
  Filled 2018-09-25: qty 150

## 2018-09-25 MED ORDER — DARBEPOETIN ALFA 200 MCG/0.4ML IJ SOSY
PREFILLED_SYRINGE | INTRAMUSCULAR | Status: AC
  Administered 2018-09-25: 200 ug via INTRAVENOUS
  Filled 2018-09-25: qty 0.4

## 2018-09-25 MED ORDER — DM-GUAIFENESIN ER 30-600 MG PO TB12
1.0000 | ORAL_TABLET | Freq: Two times a day (BID) | ORAL | Status: DC | PRN
  Administered 2018-09-25: 1 via ORAL
  Filled 2018-09-25 (×2): qty 1

## 2018-09-25 MED ORDER — LATANOPROST 0.005 % OP SOLN
1.0000 [drp] | Freq: Every day | OPHTHALMIC | Status: DC
  Administered 2018-09-25 – 2018-10-22 (×28): 1 [drp] via OPHTHALMIC
  Filled 2018-09-25 (×2): qty 2.5

## 2018-09-25 MED ORDER — HEPARIN SODIUM (PORCINE) 1000 UNIT/ML IJ SOLN
INTRAMUSCULAR | Status: AC
  Filled 2018-09-25: qty 4

## 2018-09-25 MED ORDER — TIMOLOL MALEATE 0.5 % OP SOLN
1.0000 [drp] | Freq: Two times a day (BID) | OPHTHALMIC | Status: DC
  Administered 2018-09-25 – 2018-10-23 (×59): 1 [drp] via OPHTHALMIC
  Filled 2018-09-25 (×2): qty 5

## 2018-09-25 MED ORDER — CLOTRIMAZOLE 10 MG MT TROC
10.0000 mg | Freq: Every day | OROMUCOSAL | Status: DC
  Administered 2018-09-25: 10 mg via ORAL
  Filled 2018-09-25 (×4): qty 1

## 2018-09-25 MED ORDER — DARBEPOETIN ALFA 200 MCG/0.4ML IJ SOSY
200.0000 ug | PREFILLED_SYRINGE | INTRAMUSCULAR | Status: DC
  Administered 2018-09-25 – 2018-10-23 (×5): 200 ug via INTRAVENOUS
  Filled 2018-09-25 (×7): qty 0.4

## 2018-09-25 MED ORDER — ALTEPLASE 2 MG IJ SOLR
2.0000 mg | Freq: Once | INTRAMUSCULAR | Status: DC | PRN
  Filled 2018-09-25: qty 2

## 2018-09-25 MED ORDER — HEPARIN (PORCINE) 25000 UT/250ML-% IV SOLN
550.0000 [IU]/h | INTRAVENOUS | Status: DC
  Administered 2018-09-25 (×2): 700 [IU]/h via INTRAVENOUS
  Filled 2018-09-25: qty 250

## 2018-09-25 MED ORDER — HEPARIN SODIUM (PORCINE) 1000 UNIT/ML DIALYSIS: 1850.0000 [IU] | Freq: Once | INTRAMUSCULAR | Status: DC

## 2018-09-25 MED ORDER — CHLORHEXIDINE GLUCONATE CLOTH 2 % EX PADS
6.0000 | MEDICATED_PAD | Freq: Every day | CUTANEOUS | Status: DC
  Administered 2018-09-25 – 2018-09-27 (×3): 6 via TOPICAL

## 2018-09-25 MED ORDER — TRAVOPROST (BAK FREE) 0.004 % OP SOLN
1.0000 [drp] | Freq: Every day | OPHTHALMIC | Status: DC
  Administered 2018-09-25 – 2018-09-29 (×6): 1 [drp] via OPHTHALMIC
  Filled 2018-09-25: qty 2.5

## 2018-09-25 MED ORDER — HYDROXYCHLOROQUINE SULFATE 200 MG PO TABS
200.0000 mg | ORAL_TABLET | Freq: Every day | ORAL | Status: DC
  Administered 2018-09-25 – 2018-09-30 (×6): 200 mg via ORAL
  Filled 2018-09-25 (×7): qty 1

## 2018-09-25 MED ORDER — PRO-STAT SUGAR FREE PO LIQD
30.0000 mL | Freq: Two times a day (BID) | ORAL | Status: DC
  Administered 2018-09-25 – 2018-09-30 (×10): 30 mL via ORAL
  Filled 2018-09-25 (×13): qty 30

## 2018-09-25 MED ORDER — PREDNISONE 10 MG PO TABS: 10.0000 mg | ORAL_TABLET | Freq: Every day | ORAL | Status: DC

## 2018-09-25 MED ORDER — BRIMONIDINE TARTRATE 0.2 % OP SOLN
1.0000 [drp] | Freq: Two times a day (BID) | OPHTHALMIC | Status: DC
  Administered 2018-09-25 – 2018-10-23 (×57): 1 [drp] via OPHTHALMIC
  Filled 2018-09-25 (×2): qty 5

## 2018-09-25 NOTE — Plan of Care (Signed)
  Problem: Education: Goal: Knowledge of General Education information will improve Description Including pain rating scale, medication(s)/side effects and non-pharmacologic comfort measures Outcome: Progressing   

## 2018-09-25 NOTE — Procedures (Signed)
I was present at this session.  I have reviewed the session itself and made appropriate changes.  HD via PC , confused bp 90-110.  Cath flow 400.  [name] 12/28/20194:11 PM

## 2018-09-25 NOTE — Consult Note (Addendum)
[facility] Renal Consultation Note    Indication for Consultation:  Management of ESRD/hemodialysis; anemia, hypertension/volume and secondary hyperparathyroidism PCP:  HPI: [name] is a 72 y.o. female with ESRD on hemodialysis T,Th,S at [facility]. ESRD presumably 2/2 ESRD presumbly 2/2 collapsing focal segmental nephrosclerosis SLE, Type 2 DM, gout, COPD, Hx A fib/flutter (on Eliquis), chronic diastolic CHF, Hx DVT (8182), AOCD, SHPT. R AKA 05/2018 L TMA 09/06/18. Last HD 09/23/18 ran full treatment, left 0.6 kg under OP EDW.   Patient S/P L TMA 09/06/18. Arteriogram showed single vessel run-off posterior tib artery occluded at level of ankle. Patient was advised to have L AKA as she had poor chance of  per Dr. Scot Dock. She refused. Patient had follow up visit with [facility] yesterday. She had increased lethargy, malaise,C/O pain and drainage L foot. She was noted during office visit to have temperature 101, concern for sepsis. She was sent to ED from MD office for admission. She has been scheduled for L AKA 09/26/2018 per Dr. Donnetta Hutching. BC X 2 done, NGTD X 24 hours. She has been started on Vanc/ceftriaxone per primary. Transitioned off eliquis-on heparin gtt.   Patient is awake, lethargic, doesn't feel well. C/O pain R side and R hip, says she can't eat because her mouth is sore. LLE mottled, warm, tender to touch with crusted drainage noted along suture line of TMA. Has failed L AVF has been using [facility] for dialysis.    Past Medical History:  Diagnosis Date  . Anemia   . Arthritis   . Asthma   . Atrial fibrillation ([place])   . Atrial flutter ([place])    a. s/p TEE/DCCV December 2013 b. 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 Bone spur of toe    Right 5th toe  . Chronic diastolic CHF  (congestive heart failure) (HCC)    a. EF 50-55% by echo in 2015  . Colon polyposis   . COPD (chronic obstructive pulmonary disease) ([place])   . DVT of upper extremity (deep vein thrombosis) ([place])    Right basilic vein, June 9937  . ESRD (end stage renal disease) on dialysis ([place])    "TTF; [place]; off [employer]." (09/07/2018)  . Essential hypertension, benign   . Fractures   . Glaucoma   . Lupus ([place])   . Pericarditis   . SLE (systemic lupus erythematosus) ([place])   . Type 2 diabetes mellitus ([place])   . UTI (lower urinary tract infection)   . Wound of right leg 06/2017   Past Surgical History:  Procedure Laterality Date  . ABDOMINAL AORTOGRAM W/LOWER EXTREMITY N/A 12/18/2017   Procedure: ABDOMINAL AORTOGRAM W/LOWER EXTREMITY;  Surgeon: Elam Dutch, MD;  Location: [place] [facility];  Service: Cardiovascular;  Laterality: N/A;  bilateral  . ABDOMINAL HYSTERECTOMY  1983  . AMPUTATION Right 06/08/2018   Procedure: RIGHT ABOVE KNEE AMPUTATION;  Surgeon: Elam Dutch, MD;  Location: [place];  Service: Vascular;  Laterality: Right;  . ANKLE SURGERY  1993  . AV FISTULA PLACEMENT Left 03/27/2014   Procedure: ARTERIOVENOUS (AV) FISTULA CREATION;  Surgeon: Rosetta Posner, MD;  Location: [place];  Service: Vascular;  Laterality: Left;  . BACK SURGERY  1980  . BASCILIC VEIN TRANSPOSITION Left 01/13/2018   Procedure: LEFT BASILIC VEIN TRANSPOSITION SECOND STAGE;  Surgeon: [name]  E, MD;  Location: [place];  Service: Vascular;  Laterality: Left;  . CARDIOVASCULAR STRESS TEST  12/19/2009   no stress induced rhythm abnormalities, ekg negative for ischemia  . CARDIOVERSION  09/16/2012   Procedure: CARDIOVERSION;  Surgeon: Sanda Klein, MD;  [facility];  Service: Cardiovascular;  Laterality: N/A;  . CATARACT EXTRACTION    . COLONOSCOPY  09/20/2012   Dr. [name]: multiple tubular adenomas   . COLONOSCOPY  2005   Dr. [name]. Polyps, path unknown   . COLONOSCOPY N/A 07/24/2016    Dr. [name]: 3 significant size polyps removed from the colon, tubular adenomas.  Next colonoscopy in October 2020.  Marland Kitchen ESOPHAGOGASTRODUODENOSCOPY  09/20/2012   Dr. [name]: duodenal erosion and possible resolving ulcer at the angularis   . ESOPHAGOGASTRODUODENOSCOPY N/A 03/24/2018   Dr. [name]: Mild erosive reflux esophagitis, erosive gastropathy and enteropathy fairly extensive, no H. pylori on biopsy.  Marland Kitchen EYE SURGERY    . IR FLUORO GUIDE CV LINE RIGHT  11/09/2017  . IR RADIOLOGIST EVAL & MGMT  09/01/2018  . IR US GUIDE VASC ACCESS RIGHT  11/09/2017  . REVISON OF ARTERIOVENOUS FISTULA Left 03/15/2018   Procedure: REVISION OF Left arm BASILIC VEIN TRANSPOSITION;  Surgeon: Angelia Mould, MD;  Location: [place];  Service: Vascular;  Laterality: Left;  . TEE WITHOUT CARDIOVERSION  09/16/2012   Procedure: TRANSESOPHAGEAL ECHOCARDIOGRAM (TEE);  Surgeon: Sanda Klein, MD;  Location: [facility] ENDOSCOPY;  Service: Cardiovascular;  Laterality: N/A;  . TRANSESOPHAGEAL ECHOCARDIOGRAM WITH CARDIOVERSION  09/16/2012   EF 60-65%, moderate LVH, moderate regurg of the aortic valve, LA moderately dilated  . TRANSMETATARSAL AMPUTATION Left 09/06/2018   Procedure: TRANSMETATARSAL AMPUTATION;  Surgeon: Angelia Mould, MD;  Location: [facility] OR;  Service: Vascular;  Laterality: Left;   Family History  Problem Relation Age of Onset  . Diabetes Brother   . Diabetes Father   . Hypertension Father   . Hypertension Sister   . Stroke Mother   . Diabetes Sister   . Hypertension Brother   . Colon cancer Neg Hx    Social History:  reports that she quit smoking about 6 years ago. Her smoking use included cigarettes. She has a 30.00 pack-year smoking history. She has never used smokeless tobacco. She reports that she does not drink alcohol or use drugs. Allergies  Allergen Reactions  . Ace Inhibitors Cough   Prior to Admission medications   Medication Sig Start Date End Date Taking? Authorizing Provider   acetaminophen (TYLENOL) 325 MG tablet Take 1-2 tablets (325-650 mg total) by mouth every 4 (four) hours as needed for mild pain. Patient taking differently: Take 325-650 mg by mouth every 4 (four) hours as needed (for mild to moderate pain).  06/24/18  Yes Love, Ivan Anchors, PA-C  albuterol (PROVENTIL HFA;VENTOLIN HFA) 108 (90 BASE) MCG/ACT inhaler Inhale 2 puffs into the lungs every 6 (six) hours as needed for wheezing or shortness of breath.    Yes [provider]  allopurinol (ZYLOPRIM) 100 MG tablet Take 100 mg by mouth every Monday, Wednesday, and Friday.    Yes [provider]  Amino Acids-Protein Hydrolys (FEEDING SUPPLEMENT, PRO-STAT SUGAR FREE 64,) LIQD Take 30 mLs by mouth 2 (two) times daily. 12/29/17  Yes Danford, Suann Larry, MD  amiodarone (PACERONE) 200 MG tablet Take 1 tablet (200 mg total) by mouth daily. 08/04/18  Yes Strader, Star, PA-C  apixaban (ELIQUIS) 5 MG TABS tablet Take 1 tablet (5 mg total) by mouth 2 (two) times daily.  12/29/17  Yes Danford, Suann Larry, MD  atorvastatin (LIPITOR) 20 MG tablet Take 20 mg by mouth at bedtime.    Yes [provider]  brimonidine-timolol (COMBIGAN) 0.2-0.5 % ophthalmic solution Place 1 drop into the left eye 2 (two) times daily.    Yes [provider]  carbamazepine (TEGRETOL XR) 100 MG 12 hr tablet Take 100 mg by mouth 2 (two) times daily.   Yes [provider]  docusate sodium (COLACE) 100 MG capsule Take 100 mg by mouth 2 (two) times daily.   Yes [provider]  fentaNYL (DURAGESIC - DOSED MCG/HR) 25 MCG/HR patch Place 25 mcg onto the skin every 3 (three) days.   Yes [provider]  folic acid (FOLVITE) 203 MCG tablet Take 800 mcg by mouth daily.   Yes [provider]  insulin aspart (NOVOLOG FLEXPEN) 100 UNIT/ML FlexPen Inject 5 Units into the skin 3 (three) times daily with meals as needed (if BGL is over 100).   Yes [provider]  latanoprost  (XALATAN) 0.005 % ophthalmic solution Place 1 drop into both eyes at bedtime.   Yes [provider]  metoprolol tartrate (LOPRESSOR) 25 MG tablet Take 1/2 tablet twice a day on Mondays, Wednesdays and Fridays Patient taking differently: Take 12.5 mg by mouth See admin instructions. Take 12.5 mg by mouth two times a day on Mon/Wed/Fri 06/24/18  Yes Love, Ivan Anchors, PA-C  multivitamin (RENA-VIT) TABS tablet Take 1 tablet by mouth daily.   Yes [provider]  Oxycodone HCl 10 MG TABS Take 10 mg by mouth every 6 (six) hours as needed (for moderate to severe pain).    Yes [provider]  pantoprazole (PROTONIX) 40 MG tablet Take 1 tablet (40 mg total) by mouth daily. 03/29/18  Yes Rourk, [name], MD  predniSONE (DELTASONE) 5 MG tablet Take 5 mg by mouth daily with breakfast.   Yes [provider]  calcium acetate (PHOSLO) 667 MG capsule Take 667 mg by mouth 3 (three) times daily with meals.  07/01/18   [provider]  Control Gel Formula Dressing (DUODERM CGF BORDER DRESSING EX) Place 1 patch onto the skin daily. Applied to left hip    [provider]  hydroxychloroquine (PLAQUENIL) 200 MG tablet Take 200 mg by mouth daily.  09/24/18   [provider]  travoprost, benzalkonium, (TRAVATAN) 0.004 % ophthalmic solution Place 1 drop into both eyes at bedtime.     [provider]  UNIFINE PENTIPS 31G X 6 MM MISC USE AS DIRECTED AT BEDTIME WITH LEVEMIR AND WITH SLIDING SCALE INSULIN. Patient taking differently: as directed.  09/03/18   Cassandria Anger, MD   Current Facility-Administered Medications  Medication Dose Route Frequency Provider Last Rate Last Dose  . acetaminophen (TYLENOL) tablet 650 mg  650 mg Oral Q6H PRN Ivor Costa, MD   650 mg at 09/25/18 0246   Or  . acetaminophen (TYLENOL) suppository 650 mg  650 mg Rectal Q6H PRN Ivor Costa, MD      . albuterol (PROVENTIL) (2.5 MG/3ML) 0.083% nebulizer solution 2.5 mg  2.5 mg  Nebulization Q4H PRN Ivor Costa, MD      . Derrill Memo ON 09/27/2018] allopurinol (ZYLOPRIM) tablet 100 mg  100 mg Oral Q M,W,F Ivor Costa, MD      . amiodarone (PACERONE) tablet 200 mg  200 mg Oral Daily Ivor Costa, MD   200 mg at 09/25/18 0846  . atorvastatin (LIPITOR) tablet 20 mg  20 mg  Oral Gordan Payment, MD   20 mg at 09/25/18 0235  . brimonidine (ALPHAGAN) 0.2 % ophthalmic solution 1 drop  1 drop Left Eye BID Ivor Costa, MD   1 drop at 09/25/18 0847   And  . timolol (TIMOPTIC) 0.5 % ophthalmic solution 1 drop  1 drop Left Eye BID Ivor Costa, MD   1 drop at 09/25/18 0847  . calcium acetate (PHOSLO) capsule 667 mg  667 mg Oral TID WC Ivor Costa, MD   667 mg at 09/25/18 0846  . carbamazepine (TEGRETOL XR) 12 hr tablet 100 mg  100 mg Oral BID Ivor Costa, MD   100 mg at 09/25/18 0846  . cefTRIAXone (ROCEPHIN) 1 g in sodium chloride 0.9 % 100 mL IVPB  1 g Intravenous Q24H Ivor Costa, MD   Stopped at 09/24/18 2129  . Chlorhexidine Gluconate Cloth 2 % PADS 6 each  6 each Topical Q0600 Valentina Gu, NP   6 each at 09/25/18 0848  . dextromethorphan-guaiFENesin (MUCINEX DM) 30-600 MG per 12 hr tablet 1 tablet  1 tablet Oral BID PRN Ivor Costa, MD   1 tablet at 09/25/18 0256  . docusate sodium (COLACE) capsule 100 mg  100 mg Oral BID Ivor Costa, MD   100 mg at 09/25/18 0846  . feeding supplement (PRO-STAT SUGAR FREE 64) liquid 30 mL  30 mL Oral BID Ivor Costa, MD   30 mL at 09/25/18 0845  . folic acid (FOLVITE) tablet 1 mg  1,000 mcg Oral Daily Ivor Costa, MD   1 mg at 09/25/18 0847  . heparin ADULT infusion 100 units/mL (25000 units/222mL sodium chloride 0.45%)  700 Units/hr Intravenous Continuous Bonnielee Haff, MD 7 mL/hr at 09/25/18 0942 700 Units/hr at 09/25/18 0942  . hydrALAZINE (APRESOLINE) injection 5 mg  5 mg Intravenous Q2H PRN Ivor Costa, MD      . hydrocortisone sodium succinate (SOLU-CORTEF) 100 MG injection 50 mg  50 mg Intravenous Q12H Ivor Costa, MD   50 mg at 09/25/18 0847  .  hydroxychloroquine (PLAQUENIL) tablet 200 mg  200 mg Oral Daily Ivor Costa, MD   200 mg at 09/25/18 0847  . hydrOXYzine (ATARAX/VISTARIL) tablet 10 mg  10 mg Oral TID PRN Ivor Costa, MD      . insulin aspart (novoLOG) injection 0-5 Units  0-5 Units Subcutaneous QHS Ivor Costa, MD      . insulin aspart (novoLOG) injection 0-9 Units  0-9 Units Subcutaneous TID WC Ivor Costa, MD   1 Units at 09/25/18 0848  . latanoprost (XALATAN) 0.005 % ophthalmic solution 1 drop  1 drop Both Eyes QHS Ivor Costa, MD   1 drop at 09/25/18 0236  . [START ON 09/27/2018] metoprolol tartrate (LOPRESSOR) tablet 12.5 mg  12.5 mg Oral 2 times per day on Mon Wed Fri Niu, Soledad Gerlach, MD      . multivitamin (RENA-VIT) tablet 1 tablet  1 tablet Oral Daily Ivor Costa, MD   1 tablet at 09/25/18 0847  . oxyCODONE (Oxy IR/ROXICODONE) immediate release tablet 10 mg  10 mg Oral Q6H PRN Ivor Costa, MD   10 mg at 09/25/18 0116  . pantoprazole (PROTONIX) EC tablet 40 mg  40 mg Oral Daily Ivor Costa, MD   40 mg at 09/25/18 0846  . Travoprost (BAK Free) (TRAVATAN) 0.004 % ophthalmic solution SOLN 1 drop  1 drop Both Eyes QHS Ivor Costa, MD   1 drop at 09/25/18 0242  . vancomycin (VANCOCIN) IVPB 750 mg/150 ml premix  750 mg Intravenous Q T,Th,Sa-HD Reginia Naas, RPH      . zolpidem (AMBIEN) tablet 5 mg  5 mg Oral QHS PRN Ivor Costa, MD       Labs: Basic Metabolic Panel: Recent Labs  Lab 09/24/18 1749 09/24/18 1821 09/25/18 0626  NA 133* 132* 133*  K 3.6 3.8 3.7  CL 94* 95* 94*  CO2 31  --  25  GLUCOSE 114* 107* 144*  BUN 38* 48* 49*  CREATININE 4.22* 4.30* 4.91*  CALCIUM 7.8*  --  7.6*   Liver Function Tests: Recent Labs  Lab 09/24/18 1749  AST 31  ALT 9  ALKPHOS 72  BILITOT 0.6  PROT 6.3*  ALBUMIN 1.9*   No results for input(s): LIPASE, AMYLASE in the last 168 hours. No results for input(s): AMMONIA in the last 168 hours. CBC: Recent Labs  Lab 09/24/18 1749 09/24/18 1821 09/25/18 0626  WBC 7.1  --  13.0*   NEUTROABS 5.9  --   --   HGB 9.5* 11.9* 8.2*  HCT 33.2* 35.0* 28.3*  MCV 94.9  --  93.4  PLT 192  --  169   Cardiac Enzymes: No results for input(s): CKTOTAL, CKMB, CKMBINDEX, TROPONINI in the last 168 hours. CBG: Recent Labs  Lab 09/25/18 0032  GLUCAP 105*   Iron Studies: No results for input(s): IRON, TIBC, TRANSFERRIN, FERRITIN in the last 72 hours. Studies/Results: Ct Head Wo Contrast  Result Date: 09/24/2018 CLINICAL DATA:  Encephalopathy EXAM: CT HEAD WITHOUT CONTRAST TECHNIQUE: Contiguous axial images were obtained from the base of the skull through the vertex without intravenous contrast. COMPARISON:  MRI 12/26/2017 FINDINGS: Brain: Small left parafalcine meningioma measuring 7 x 3 x 6 mm, stable in appearance. Patchy periventricular white matter hypodensities compatible with chronic microvascular ischemia. No large vascular territory infarct. Chronic involutional changes brain with sulcal and ventricular prominence. Intra-axial mass nor extra-axial fluid. No acute intracranial hemorrhage. Small chronic basal ganglia lacunar infarcts are again noted. Midline fourth ventricle basal cisterns without effacement. Brainstem and cerebellum appear nonacute. Vascular: No hyperdense vessel sign. Skull: Intact Sinuses/Orbits: Bilateral lens replacements. Slight lateral deviation of gaze involving the right globe. No acute sinus disease. Other: No significant calvarial soft tissue swelling. IMPRESSION: 1. Stable small left parafalcine meningioma measuring 7 x 3 x 6 mm. 2. Chronic small vessel ischemic disease. No acute intracranial abnormality. Electronically Signed   By: Ashley Royalty M.D.   On: 09/24/2018 21:44   Dg Chest Port 1 View  Result Date: 09/24/2018 CLINICAL DATA:  Fever over diabetic foot wound. EXAM: PORTABLE CHEST 1 VIEW COMPARISON:  03/01/2018 FINDINGS: Stable cardiomegaly with aortic atherosclerosis. Right IJ approach dialysis catheter is noted with tip in the proximal right  atrium. Lungs are clear without edema or consolidation. No effusion or pneumothorax. No acute osseous appearing abnormality. IMPRESSION: No active disease.  Stable cardiomegaly with aortic atherosclerosis. Electronically Signed   By: Ashley Royalty M.D.   On: 09/24/2018 17:50    ROS: As per HPI otherwise negative.   Physical Exam: Vitals:   09/25/18 0105 09/25/18 0505 09/25/18 0815 09/25/18 0843  BP: (!) 187/36 (!) 139/38  (!) 142/53  Pulse: 78 69  (!) 59  Resp: 12 14  18   Temp: 100.1 F (37.8 C) 98.6 F (37 C)  98.2 F (36.8 C)  TempSrc: Oral Oral  Oral  SpO2: 100% 99% 100% 100%     General: Chronically ill appearing female in no acute distress.asterixis, head bobbing, confused Head: Normocephalic, atraumatic,  sclera non-icteric, mucus membranes are dry. Oral cavity reddened with white spots on tongue  Neck: Supple. Slight JVD.PCL Lungs: Bilateral breath sounds with bibasilar crackles R>L slightly decreased in bases with few scattered rhonchi-mid-upper RL. No WOB. Marland Kitchen Heart: RRR with S1 S2 1/6 systolic M.  Abdomen: Soft, tender RUQ/RLG. Active BS. Liver down 4-5 cm tender.  M-S:  Strength and tone decreased. Lower Extremities-R AKA no stump edema. Trace LLE edema. LLE mottled cool to touch with crusted dry drainage along TMA Warm to mid calf,  suture line, foul odor, blackened margins along suture line.   Neuro: Alert and oriented X 2. Moves all extremities spontaneously. Psych:  Responds to questions slowly but appropriately Dialysis Access: L AVF-no bruit. RIJ TDC drsg intact  Dialysis Orders: [place] T,Th,S 3.5 hrs 180NRe 400/800 61 kg 2.0 K/ 2.0 Ca  Maturing LUA AVF/RIJ TDC -Heparin 1850 units IV TIW -Mircera 225 mcg IV q 2 weeks (last dose 09/14/18 Last HGB 8.8 09/25/18)   Assessment/Plan: 1.  Non-healing L TMA/Cellulitis LLE-on Vanc/ceftriaxone  per primary. To OR tomorrow for L AKA per Dr. Donnetta Hutching.  2.  ESRD -  T,Th,S. HD today on schedule. K+ 3.7. Use 4.0 K bath. No  heparin-on heparin gtt.  3.  Hypertension/volume  - Left under EDW last tx. BP high on adm-better controlled this AM. Low dose metoprolol tartrate 12.5 mg PO BID. Dose different here from OP med list. Attempt UFG 1.5-2.5 liters.  4.  Anemia  -HGB 8.8. Give Aranesp 200 mcg IV today with HD.  5.  Metabolic bone disease -  Add RFP to today's labs. Ca 7.8 C Ca 9.5. No VDRA, continue binders 6.  Nutrition - Albumin 1.9 PCM/poor PO intake. Renal/Carb mod diet, prostat. Treat oral thrush.  7.  SLE-on plaquenil/stress dose solu-cortef, prednisone. .  8.  DM- per primary 9.  Failed AVF-ask VVS to see for new perm access while in hospital 10.  Oral thrush-clotrimazole troches 10 mg PO 5 x daily for 14 days 11.  Afib-on amiodarone-currently on heparin gtt.  12.  HFpEF-last EF 55-60% 10/2017. Monitor volume status carefully 13.  H/O gout-continue allopurinol 14.  Pain R side/R pelvis-check flat flat of abdomin   Rita H. Owens Shark, NP-C 09/25/2018, 10:23 AM  [employer] [phone]  I have seen and examined this patient and agree with the plan of care seen , examined, eval, discussed with husband , Dr. Donnetta Hutching, NP .  Jeneen Rinks Efraim Vanallen 09/25/2018, 12:18 PM

## 2018-09-25 NOTE — Progress Notes (Addendum)
1350: Critical lab reported; PTT>200. Heparin drip turned off per pharmacy.  1555: PTT>200, MD notified.

## 2018-09-25 NOTE — Progress Notes (Signed)
[place] for hepain Indication: atrial fibrillation / DVT  Allergies  Allergen Reactions  . Ace Inhibitors Cough    Patient Measurements: TBW 60.8 kg Heparin Dosing Weight: 58 kg  Vital Signs: Temp: 98.2 F (36.8 C) (12/28 2004) Temp Source: Oral (12/28 2004) BP: 130/40 (12/28 2004) Pulse Rate: 70 (12/28 2004)  Labs: Recent Labs    09/24/18 1749 09/24/18 1821  09/25/18 0626 09/25/18 1221 09/25/18 1458 09/25/18 2026  HGB 9.5* 11.9*  --  8.2*  --   --   --   HCT 33.2* 35.0*  --  28.3*  --   --   --   PLT 192  --   --  169  --   --   --   APTT  --   --    < > >200* >200* >200* 69*  HEPARINUNFRC  --   --   --  >2.20* >2.20* >2.20*  --   CREATININE 4.22* 4.30*  --  4.91* 5.11*  --   --    < > = values in this interval not displayed.    Estimated Creatinine Clearance: 8 mL/min (A) (by C-G formula based on SCr of 5.11 mg/dL (H)).   Medical History: Past Medical History:  Diagnosis Date  . Anemia   . Arthritis   . Asthma   . Atrial fibrillation ([place])   . Atrial flutter ([place])    a. s/p TEE/DCCV December 2013 b. 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 Bone spur of toe    Right 5th toe  . Chronic diastolic CHF (congestive heart failure) (HCC)    a. EF 50-55% by echo in 2015  . Colon polyposis   . COPD (chronic obstructive pulmonary disease) ([place])   . DVT of upper extremity (deep vein thrombosis) ([place])    Right basilic vein, June 8127  . ESRD (end stage renal disease) on dialysis ([place])    "TTF; [facility]; off [employer]." (09/07/2018)  . Essential hypertension, benign   . Fractures   . Glaucoma   . Lupus ([place])   . Pericarditis   . SLE (systemic lupus erythematosus) ([place])   . Type 2 diabetes mellitus ([place])   . UTI (lower urinary tract infection)   .  Wound of right leg 06/2017    Assessment: 72 yo F presents with wound infection. On Eliquis PTA for DVT and Afib- last dose was 12/27 at 1000.   Heparin level and aPTT have both been supratherapeutic at >2.2 and >200, respectively. Hgb 8.2, plt 169. APTT this evening came back therapeutic at 69 - off heparin since 1350. No s/sx of bleeding.   Goal of Therapy:  Heparin level 0.3-0.7 units/ml aPTT 66-102 seconds Monitor platelets by anticoagulation protocol: Yes   Plan:  Re-start heparin gtt at 550 units/hr  APTT and heparin level at 6 hrs after restart Monitor daily aPTT / heparin level, CBC, s/s of bleed  Antonietta Jewel, PharmD, [place] Clinical Pharmacist  Pager: [phone] Phone: [phone] 09/25/2018   9:24 PM

## 2018-09-25 NOTE — Progress Notes (Signed)
Heparin started at 45ml. Per order with secondary nurse Pearson Grippe. Pt shows no signs of bleeding.

## 2018-09-25 NOTE — Progress Notes (Signed)
Patient ID: [name], female   DOB: [birth year], 72 y.o.   MRN: [id number]                                       Vascular and Vein Specialist of [facility]  Patient name: [name] MRN: [id number] DOB: [birth year] Sex: female    HPI: [name] is a 72 y.o. female admitted with progressive gangrene of her left foot.  She had evaluation in November showing very poor flow into her foot.  On the note from November 13, Dr. Scot Dock had recommended left above-knee amputation.  Patient was not willing to proceed with this and a transmetatarsal amputation was done with high expectancy of failure.  She presented to our office yesterday with a fever of 101 and cellulitis and necrotic foul-smelling open transmetatarsal amputation.  He was admitted for IV antibiotics.  Phonic end-stage renal disease on hemodialysis  Past Medical History:  Diagnosis Date  . Anemia   . Arthritis   . Asthma   . Atrial fibrillation ([place])   . Atrial flutter ([place])    a. s/p TEE/DCCV December 2013 b. 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 Bone spur of toe    Right 5th toe  . Chronic diastolic CHF (congestive heart failure) (HCC)    a. EF 50-55% by echo in 2015  . Colon polyposis   . COPD (chronic obstructive pulmonary disease) ([place])   . DVT of upper extremity (deep vein thrombosis) ([place])    Right basilic vein, June 4709  . ESRD (end stage renal disease) on dialysis ([place])    "TTF; [place]; off [employer]." (09/07/2018)  . Essential hypertension, benign   . Fractures   . Glaucoma   . Lupus ([place])   . Pericarditis   . SLE (systemic lupus erythematosus) ([place])   . Type 2 diabetes mellitus ([place])   . UTI (lower urinary tract infection)   . Wound of right leg 06/2017    Family History  Problem Relation Age of Onset  .  Diabetes Brother   . Diabetes Father   . Hypertension Father   . Hypertension Sister   . Stroke Mother   . Diabetes Sister   . Hypertension Brother   . Colon cancer Neg Hx     SOCIAL HISTORY: Social History   Tobacco Use  . Smoking status: Former Smoker    Packs/day: 1.00    Years: 30.00    Pack years: 30.00    Types: Cigarettes    Last attempt to quit: 08/29/2012    Years since quitting: 6.0  . Smokeless tobacco: Never Used  . Tobacco comment: quit 08-2012  Substance Use Topics  . Alcohol use: No    Alcohol/week: 0.0 standard drinks    Allergies  Allergen Reactions  . Ace Inhibitors Cough    Current Facility-Administered Medications  Medication Dose Route Frequency Provider Last Rate Last Dose  . acetaminophen (TYLENOL) tablet 650 mg  650 mg Oral Q6H PRN Ivor Costa, MD   650 mg at 09/25/18 0246   Or  . acetaminophen (TYLENOL) suppository 650 mg  650 mg Rectal Q6H PRN Ivor Costa, MD      . albuterol (PROVENTIL) (2.5 MG/3ML) 0.083% nebulizer solution 2.5 mg  2.5 mg Nebulization Q4H PRN Ivor Costa, MD      . Derrill Memo ON 09/27/2018] allopurinol (ZYLOPRIM) tablet 100 mg  100 mg Oral Q M,W,F Ivor Costa, MD      . amiodarone (PACERONE) tablet 200 mg  200 mg Oral Daily Ivor Costa, MD   200 mg at 09/25/18 0846  . atorvastatin (LIPITOR) tablet 20 mg  20 mg Oral QHS Ivor Costa, MD   20 mg at 09/25/18 0235  . brimonidine (ALPHAGAN) 0.2 % ophthalmic solution 1 drop  1 drop Left Eye BID Ivor Costa, MD   1 drop at 09/25/18 0847   And  . timolol (TIMOPTIC) 0.5 % ophthalmic solution 1 drop  1 drop Left Eye BID Ivor Costa, MD   1 drop at 09/25/18 0847  . calcium acetate (PHOSLO) capsule 667 mg  667 mg Oral TID WC Ivor Costa, MD   667 mg at 09/25/18 0846  . carbamazepine (TEGRETOL XR) 12 hr tablet 100 mg  100 mg Oral BID Ivor Costa, MD   100 mg at 09/25/18 0846  . cefTRIAXone (ROCEPHIN) 1 g in sodium chloride 0.9 % 100 mL IVPB  1 g Intravenous Q24H Ivor Costa, MD   Stopped at 09/24/18 2129    . Chlorhexidine Gluconate Cloth 2 % PADS 6 each  6 each Topical Q0600 Valentina Gu, NP   6 each at 09/25/18 0848  . dextromethorphan-guaiFENesin (MUCINEX DM) 30-600 MG per 12 hr tablet 1 tablet  1 tablet Oral BID PRN Ivor Costa, MD   1 tablet at 09/25/18 0256  . docusate sodium (COLACE) capsule 100 mg  100 mg Oral BID Ivor Costa, MD   100 mg at 09/25/18 0846  . feeding supplement (PRO-STAT SUGAR FREE 64) liquid 30 mL  30 mL Oral BID Ivor Costa, MD   30 mL at 09/25/18 0845  . folic acid (FOLVITE) tablet 1 mg  1,000 mcg Oral Daily Ivor Costa, MD   1 mg at 09/25/18 0847  . heparin ADULT infusion 100 units/mL (25000 units/225mL sodium chloride 0.45%)  700 Units/hr Intravenous Continuous Bonnielee Haff, MD 7 mL/hr at 09/25/18 0942 700 Units/hr at 09/25/18 0942  . hydrALAZINE (APRESOLINE) injection 5 mg  5 mg Intravenous Q2H PRN Ivor Costa, MD      . hydrocortisone sodium succinate (SOLU-CORTEF) 100 MG injection 50 mg  50 mg Intravenous Q12H Ivor Costa, MD   50 mg at 09/25/18 0847  . hydroxychloroquine (PLAQUENIL) tablet 200 mg  200 mg Oral Daily Ivor Costa, MD   200 mg at 09/25/18 0847  . hydrOXYzine (ATARAX/VISTARIL) tablet 10 mg  10 mg Oral TID PRN Ivor Costa, MD      . insulin aspart (novoLOG) injection 0-5 Units  0-5 Units Subcutaneous QHS Ivor Costa, MD      . insulin aspart (novoLOG) injection 0-9 Units  0-9 Units Subcutaneous TID WC Ivor Costa, MD   1 Units at 09/25/18 0848  . latanoprost (XALATAN) 0.005 % ophthalmic solution 1 drop  1 drop Both Eyes QHS Ivor Costa, MD   1 drop at 09/25/18 0236  . [START ON 09/27/2018] metoprolol tartrate (LOPRESSOR) tablet 12.5 mg  12.5 mg Oral 2 times per day on Mon Wed Fri Niu, Soledad Gerlach, MD      . multivitamin (RENA-VIT) tablet 1 tablet  1 tablet Oral Daily Ivor Costa, MD   1 tablet at 09/25/18 0847  . oxyCODONE (Oxy IR/ROXICODONE) immediate release tablet 10 mg  10 mg Oral Q6H PRN Ivor Costa, MD  10 mg at 09/25/18 0116  . pantoprazole (PROTONIX) EC  tablet 40 mg  40 mg Oral Daily Ivor Costa, MD   40 mg at 09/25/18 0846  . Travoprost (BAK Free) (TRAVATAN) 0.004 % ophthalmic solution SOLN 1 drop  1 drop Both Eyes QHS Ivor Costa, MD   1 drop at 09/25/18 0242  . vancomycin (VANCOCIN) IVPB 750 mg/150 ml premix  750 mg Intravenous Q T,Th,Sa-HD Batchelder, Cecilio Asper, RPH      . zolpidem (AMBIEN) tablet 5 mg  5 mg Oral QHS PRN Ivor Costa, MD        REVIEW OF SYSTEMS:  [X]  denotes positive finding, [ ]  denotes negative finding Cardiac  Comments:  Chest pain or chest pressure:    Shortness of breath upon exertion:    Short of breath when lying flat:    Irregular heart rhythm:        Vascular    Pain in calf, thigh, or hip brought on by ambulation:    Pain in feet at night that wakes you up from your sleep:     Blood clot in your veins:    Leg swelling:           PHYSICAL EXAM: Vitals:   09/25/18 0105 09/25/18 0505 09/25/18 0815 09/25/18 0843  BP: (!) 187/36 (!) 139/38  (!) 142/53  Pulse: 78 69  (!) 59  Resp: 12 14  18   Temp: 100.1 F (37.8 C) 98.6 F (37 C)  98.2 F (36.8 C)  TempSrc: Oral Oral  Oral  SpO2: 100% 99% 100% 100%    GENERAL: The patient is a well-nourished female, in no acute distress. The vital signs are documented above. CARDIOVASCULAR: The patient does have a palpable left popliteal pulse. PULMONARY: There is good air exchange  MUSCULOSKELETAL: Right above-knee amputation NEUROLOGIC: No focal weakness or paresthesias are detected. SKIN: Separated skin edges over left transmetatarsal amputation cellulitis on her foot and distal calf.  Foul-smelling necrotic tissue present PSYCHIATRIC: The patient has a normal affect.  DATA:  Potassium 3.7 today.  MEDICAL ISSUES: I had an extensive discussion of over 30 minutes with the patient and her husband by telephone.  Again explained the only option would be higher level of amputation.  I agree with Dr. Scot Dock that above-knee amputation is warranted with her  contralateral above-knee amputation she is certainly not a prosthetic candidate.  Her husband asked specifically about this and I said that there is no realistic expectation of her walking on to above-knee prosthesis and that the goal would be for her to become independent transferring from bed to chair.  She may be able to heal a below-knee amputation but this would give her minimal added benefit and would certainly be much more risky of healing than above-knee amputation which is my recommendation is I agree with Dr. Nicole Cella initial recommendation from 6 weeks ago.  Have scheduled her for surgery tomorrow, Sunday, to expedite her ability to resolve this infection    Rosetta Posner, MD [facility] Vascular and Vein Specialists of [facility] Tel [phone] Pager [phone]

## 2018-09-25 NOTE — Progress Notes (Signed)
ANTICOAGULATION CONSULT NOTE - Initial Consult  Pharmacy Consult for hepain Indication: atrial fibrillation / DVT  Allergies  Allergen Reactions  . Ace Inhibitors Cough    Patient Measurements: TBW 60.8 kg Heparin Dosing Weight: 58 kg  Vital Signs: Temp: 98.6 F (37 C) (12/28 0505) Temp Source: Oral (12/28 0505) BP: 139/38 (12/28 0505) Pulse Rate: 69 (12/28 0505)  Labs: Recent Labs    09/24/18 1749 09/24/18 1821 09/25/18 0626  HGB 9.5* 11.9* 8.2*  HCT 33.2* 35.0* 28.3*  PLT 192  --  169  CREATININE 4.22* 4.30* 4.91*    Estimated Creatinine Clearance: 8.4 mL/min (A) (by C-G formula based on SCr of 4.91 mg/dL (H)).   Medical History: Past Medical History:  Diagnosis Date  . Anemia   . Arthritis   . Asthma   . Atrial fibrillation ([place])   . Atrial flutter ([place])    a. s/p TEE/DCCV December 2013 b. 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 Bone spur of toe    Right 5th toe  . Chronic diastolic CHF (congestive heart failure) (HCC)    a. EF 50-55% by echo in 2015  . Colon polyposis   . COPD (chronic obstructive pulmonary disease) ([place])   . DVT of upper extremity (deep vein thrombosis) ([place])    Right basilic vein, June 1610  . ESRD (end stage renal disease) on dialysis ([place])    "TTF; [place]; off [employer]." (09/07/2018)  . Essential hypertension, benign   . Fractures   . Glaucoma   . Lupus ([place])   . Pericarditis   . SLE (systemic lupus erythematosus) ([place])   . Type 2 diabetes mellitus ([place])   . UTI (lower urinary tract infection)   . Wound of right leg 06/2017    Assessment: 72 yo F presents with wound infection. On Eliquis PTA for DVT and Afib- last dose was 12/27 at 1000 this morning. Hgb 8.2, plts wnl.  Hep level supratherapeutic at >2.2, APTT supratherapeutic at >200. No  signs/symptoms of bleeding per nurse and rate at 900 units/hr.   Now holding heparin.   Goal of Therapy:  Heparin level 0.3-0.7 units/ml aPTT 66-102 seconds Monitor platelets by anticoagulation protocol: Yes   Plan:  Hold heparin for 1 hour Re-start heparin gtt after 1 hour hold at 700 units/hr at 1000 APTT and heparin level at 6hrs after restart Monitor daily aPTT / heparin level, CBC, s/s of bleed  Gwenlyn Found, Sherian Rein D PGY1 Pharmacy Resident  Phone [phone] 09/25/2018   8:31 AM

## 2018-09-25 NOTE — Progress Notes (Signed)
[facility] PROGRESS NOTE  [name] CWC:[id number] DOB: [birth year] DOA: 09/24/2018  PCP: Sharilyn Sites, MD  Brief History/Interval Summary: 72 y.o. female with medical history significant of hypertension, hyperlipidemia, diabetes mellitus, COPD, GERD, gout, and anemia, atrial fibrillation and DVT on Eliquis,  chronic diastolic CHF, ESRD-HD (TTS), lupus, PAD, s/p of R AKA, who presented with left foot pain.  She recently had a left transmetatarsal amputation by Dr. Doren Custard on 12/9.  Was found to have infection in that area follow-up in vascular surgery office.  She was sent over for hospitalization and further management.  Reason for Visit: Infection involving left foot wound.  Concern for gangrene  Consultants: Vascular surgery.  Nephrology  Procedures: None yet  Antibiotics: Vancomycin and ceftriaxone  Subjective/Interval History: Complains of pain in her left foot.  8 out of 10 in intensity.  Has been having abdominal pain for many months.  No nausea vomiting.  No shortness of breath.  ROS: Denies any headaches  Objective:  Vital Signs  Vitals:   09/25/18 0105 09/25/18 0505 09/25/18 0815 09/25/18 0843  BP: (!) 187/36 (!) 139/38  (!) 142/53  Pulse: 78 69  (!) 59  Resp: '12 14  18  ' Temp: 100.1 F (37.8 C) 98.6 F (37 C)  98.2 F (36.8 C)  TempSrc: Oral Oral  Oral  SpO2: 100% 99% 100% 100%    Intake/Output Summary (Last 24 hours) at 09/25/2018 1131 Last data filed at 09/25/2018 0900 Gross per 24 hour  Intake 407.5 ml  Output -  Net 407.5 ml   There were no vitals filed for this visit.  General appearance: alert, cooperative, distracted and no distress Head: Normocephalic, without obvious abnormality, atraumatic Resp: Normal effort at rest.  Clear to auscultation bilaterally.  No wheezing rales or rhonchi Cardio: regular rate and rhythm, S1, S2 normal, no murmur, click, rub or gallop GI: Abdomen is soft.  Tenderness present in the right side  without any rebound rigidity or guarding.  No masses organomegaly.  Bowel sounds present normal. Extremities: Foul smell emanating from the surgical wound on the left foot.   Poorly palpable pulses.  Warm to touch.  Right stump seems to be normal. Skin: Findings concerning for gangrenous changes in the left foot stump Neurologic: Alert.  Oriented to place person.  No focal neurological deficits otherwise.  Lab Results:  Data Reviewed: I have personally reviewed following labs and imaging studies  CBC: Recent Labs  Lab 09/24/18 1749 09/24/18 1821 09/25/18 0626  WBC 7.1  --  13.0*  NEUTROABS 5.9  --   --   HGB 9.5* 11.9* 8.2*  HCT 33.2* 35.0* 28.3*  MCV 94.9  --  93.4  PLT 192  --  761    Basic Metabolic Panel: Recent Labs  Lab 09/24/18 1749 09/24/18 1821 09/25/18 0626  NA 133* 132* 133*  K 3.6 3.8 3.7  CL 94* 95* 94*  CO2 31  --  25  GLUCOSE 114* 107* 144*  BUN 38* 48* 49*  CREATININE 4.22* 4.30* 4.91*  CALCIUM 7.8*  --  7.6*    GFR: Estimated Creatinine Clearance: 8.4 mL/min (A) (by C-G formula based on SCr of 4.91 mg/dL (H)).  Liver Function Tests: Recent Labs  Lab 09/24/18 1749  AST 31  ALT 9  ALKPHOS 72  BILITOT 0.6  PROT 6.3*  ALBUMIN 1.9*    CBG: Recent Labs  Lab 09/25/18 0032 09/25/18 1054 09/25/18 1113  GLUCAP 105* 138* 149*     Recent  Results (from the past 240 hour(s))  Blood Culture (routine x 2)     Status: None (Preliminary result)   Collection Time: 09/24/18  5:19 PM  Result Value Ref Range Status   Specimen Description BLOOD RIGHT ANTECUBITAL  Final   Special Requests   Final    BOTTLES DRAWN AEROBIC AND ANAEROBIC Blood Culture adequate volume   Culture   Final    NO GROWTH < 24 HOURS Performed at [facility], [address] [place], [place] [address]    Report Status PENDING  Incomplete  Blood Culture (routine x 2)     Status: None (Preliminary result)   Collection Time: 09/24/18  5:24 PM  Result Value Ref Range  Status   Specimen Description BLOOD BLOOD RIGHT HAND  Final   Special Requests   Final    BOTTLES DRAWN AEROBIC AND ANAEROBIC Blood Culture adequate volume   Culture   Final    NO GROWTH < 24 HOURS Performed at [facility], [place] [address] [place], [place] [address]    Report Status PENDING  Incomplete      Radiology Studies: Ct Head Wo Contrast  Result Date: 09/24/2018 CLINICAL DATA:  Encephalopathy EXAM: CT HEAD WITHOUT CONTRAST TECHNIQUE: Contiguous axial images were obtained from the base of the skull through the vertex without intravenous contrast. COMPARISON:  MRI 12/26/2017 FINDINGS: Brain: Small left parafalcine meningioma measuring 7 x 3 x 6 mm, stable in appearance. Patchy periventricular white matter hypodensities compatible with chronic microvascular ischemia. No large vascular territory infarct. Chronic involutional changes brain with sulcal and ventricular prominence. Intra-axial mass nor extra-axial fluid. No acute intracranial hemorrhage. Small chronic basal ganglia lacunar infarcts are again noted. Midline fourth ventricle basal cisterns without effacement. Brainstem and cerebellum appear nonacute. Vascular: No hyperdense vessel sign. Skull: Intact Sinuses/Orbits: Bilateral lens replacements. Slight lateral deviation of gaze involving the right globe. No acute sinus disease. Other: No significant calvarial soft tissue swelling. IMPRESSION: 1. Stable small left parafalcine meningioma measuring 7 x 3 x 6 mm. 2. Chronic small vessel ischemic disease. No acute intracranial abnormality. Electronically Signed   By: Ashley Royalty M.D.   On: 09/24/2018 21:44   Dg Chest Port 1 View  Result Date: 09/24/2018 CLINICAL DATA:  Fever over diabetic foot wound. EXAM: PORTABLE CHEST 1 VIEW COMPARISON:  03/01/2018 FINDINGS: Stable cardiomegaly with aortic atherosclerosis. Right IJ approach dialysis catheter is noted with tip in the proximal right atrium. Lungs are clear without edema or  consolidation. No effusion or pneumothorax. No acute osseous appearing abnormality. IMPRESSION: No active disease.  Stable cardiomegaly with aortic atherosclerosis. Electronically Signed   By: Ashley Royalty M.D.   On: 09/24/2018 17:50     Medications:  Scheduled: . [START ON 09/27/2018] allopurinol  100 mg Oral Q M,W,F  . amiodarone  200 mg Oral Daily  . atorvastatin  20 mg Oral QHS  . brimonidine  1 drop Left Eye BID   And  . timolol  1 drop Left Eye BID  . calcium acetate  667 mg Oral TID WC  . carbamazepine  100 mg Oral BID  . Chlorhexidine Gluconate Cloth  6 each Topical Q0600  . docusate sodium  100 mg Oral BID  . feeding supplement (PRO-STAT SUGAR FREE 64)  30 mL Oral BID  . folic acid  3,149 mcg Oral Daily  . hydrocortisone sod succinate (SOLU-CORTEF) inj  50 mg Intravenous Q12H  . hydroxychloroquine  200 mg Oral Daily  . insulin  aspart  0-5 Units Subcutaneous QHS  . insulin aspart  0-9 Units Subcutaneous TID WC  . latanoprost  1 drop Both Eyes QHS  . [START ON 09/27/2018] metoprolol tartrate  12.5 mg Oral 2 times per day on Mon Wed Fri  . multivitamin  1 tablet Oral Daily  . pantoprazole  40 mg Oral Daily  . Travoprost (BAK Free)  1 drop Both Eyes QHS   Continuous: . cefTRIAXone (ROCEPHIN)  IV Stopped (09/24/18 2129)  . heparin 700 Units/hr (09/25/18 0942)  . vancomycin     [id number] **OR** acetaminophen, albuterol, dextromethorphan-guaiFENesin, hydrALAZINE, hydrOXYzine, oxyCODONE, zolpidem    Assessment/Plan:  Left foot wound infection Patient recently underwent transmetatarsal amputation.  Concern for infection as well as gangrene.  Vascular surgery is following.  Plan is for above-knee amputation on 12/29.  Continue broad-spectrum antibiotics with vancomycin and ceftriaxone.  Follow-up on blood cultures.  No clear evidence for sepsis at this time.  CRP 13.7.  Lactic acid level was normal.  ESR 55.  Acute metabolic encephalopathy Most likely brought on by  the above.  CT head did not show any acute findings.  Mental status seems to be stable.  Continue to monitor closely.  End-stage renal disease on hemodialysis, TTS Nephrology consulted.  Normocytic anemia Most likely anemia of chronic kidney disease.  Monitor hemoglobin closely.  No evidence of overt bleeding.  History of paroxysmal atrial fibrillation Patient's chads 2 vascular score 6.  Patient was on Eliquis at home.  Eliquis has been switched to IV heparin due to need for surgery.  Stable from a cardiovascular standpoint.  Noted to be on amiodarone as well as metoprolol.  Right-sided abdominal pain This appears to be chronic based on history.  Ongoing for many months per patient and husband.  CT scan from October did not show any acute findings.  Lesion was noted in the right kidney concerning for RCC.  But no other explanation for her abdominal pain was noted.  May need further work-up but will wait for the acute issues to be treated first.  History of SLE Noted to be on prednisone at home.  Currently getting stress dose steroids.  Also on Plaquenil.  Essential hypertension Continue home medications.  Monitor blood pressures closely.  History of COPD Respiratory status appears to be stable.  Apparently she had some productive cough overnight with streaks of blood.  No further occurrence has been noted.  Monitor closely.  Nebulizer treatments.  Mucinex.  Chronic diastolic CHF Stable.  Volume being managed by hemodialysis.  Hyperlipidemia Statin  Diabetes mellitus type 2 with peripheral neuropathy and end-stage renal disease HbA1c 4.5 in December.  Will not be too aggressive.  Continue SSI.  GERD Protonix.  Gout Continue allopurinol  DVT Prophylaxis: On IV heparin    Code Status: Full code Family Communication: Discussed with the patient and her husband Disposition Plan: Management as outlined above    LOS: 1 day   [facility] Pager  [phone] 09/25/2018, 11:31 AM  If 7PM-7AM, please contact night-coverage at [email or web address], password [facility]

## 2018-09-25 NOTE — Consult Note (Signed)
[place] Nurse wound consult note Reason for Consult: Consult placed simultaneously with consultation to vascular surgery.  Patient is scheduled for amputation of infected foot tomorrow, Sunday (09/26/18) Wound type: Infectious  WOC nursing team will not follow, but will remain available to this patient, the nursing and medical teams.  Please re-consult if needed. Thanks, Maudie Flakes, MSN, RN, [place], Arther Abbott  Pager# [phone]

## 2018-09-25 NOTE — Progress Notes (Signed)
Notified Dr [name], [facility] of patient status. Patient has a productive cough with thick yellow, blood tinged secretions. Has bilateral coarse rhonchi in all anterior lobes. Patient c/o mouth and gums pain. Oral mucosa is moist. T100.6,100.1. Will continue to monitor patient's status.

## 2018-09-25 NOTE — Progress Notes (Signed)
CRITICAL VALUE ALERT  Critical Value:  PTT > 200 seconds  Date & Time Notied:  09/25/18 @ 1025  Provider Notified: 09/25/18 @ 0822  Orders Received/Actions taken: Stop current heparin dose. Restart at 0930 with new orders.

## 2018-09-25 NOTE — H&P (View-Only) (Signed)
Patient ID: [name], female   DOB: 17-Nov-1945, 72 y.o.   MRN: [id number]                                       Vascular and Vein Specialist of [facility]  Patient name: [name] MRN: [id number] DOB: [birth year] Sex: female    HPI: [name] is a 72 y.o. female admitted with progressive gangrene of her left foot.  She had evaluation in November showing very poor flow into her foot.  On the note from November 13, Dr. Scot Dock had recommended left above-knee amputation.  Patient was not willing to proceed with this and a transmetatarsal amputation was done with high expectancy of failure.  She presented to our office yesterday with a fever of 101 and cellulitis and necrotic foul-smelling open transmetatarsal amputation.  He was admitted for IV antibiotics.  Phonic end-stage renal disease on hemodialysis  Past Medical History:  Diagnosis Date  . Anemia   . Arthritis   . Asthma   . Atrial fibrillation ([place])   . Atrial flutter ([place])    a. s/p TEE/DCCV December 2013 b. 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 Bone spur of toe    Right 5th toe  . Chronic diastolic CHF (congestive heart failure) (HCC)    a. EF 50-55% by echo in 2015  . Colon polyposis   . COPD (chronic obstructive pulmonary disease) ([place])   . DVT of upper extremity (deep vein thrombosis) ([place])    Right basilic vein, June 4235  . ESRD (end stage renal disease) on dialysis ([place])    "TTF; [place]; off [employer]." (09/07/2018)  . Essential hypertension, benign   . Fractures   . Glaucoma   . Lupus ([place])   . Pericarditis   . SLE (systemic lupus erythematosus) ([place])   . Type 2 diabetes mellitus ([place])   . UTI (lower urinary tract infection)   . Wound of right leg 06/2017    Family History  Problem Relation Age of Onset  .  Diabetes Brother   . Diabetes Father   . Hypertension Father   . Hypertension Sister   . Stroke Mother   . Diabetes Sister   . Hypertension Brother   . Colon cancer Neg Hx     SOCIAL HISTORY: Social History   Tobacco Use  . Smoking status: Former Smoker    Packs/day: 1.00    Years: 30.00    Pack years: 30.00    Types: Cigarettes    Last attempt to quit: 08/29/2012    Years since quitting: 6.0  . Smokeless tobacco: Never Used  . Tobacco comment: quit 08-2012  Substance Use Topics  . Alcohol use: No    Alcohol/week: 0.0 standard drinks    Allergies  Allergen Reactions  . Ace Inhibitors Cough    Current Facility-Administered Medications  Medication Dose Route Frequency Provider Last Rate Last Dose  . acetaminophen (TYLENOL) tablet 650 mg  650 mg Oral Q6H PRN Ivor Costa, MD   650 mg at 09/25/18 0246   Or  . acetaminophen (TYLENOL) suppository 650 mg  650 mg Rectal Q6H PRN Ivor Costa, MD      . albuterol (PROVENTIL) (2.5 MG/3ML) 0.083% nebulizer solution 2.5 mg  2.5 mg Nebulization Q4H PRN Ivor Costa, MD      . Derrill Memo ON 09/27/2018] allopurinol (ZYLOPRIM) tablet 100 mg  100 mg Oral Q M,W,F Ivor Costa, MD      . amiodarone (PACERONE) tablet 200 mg  200 mg Oral Daily Ivor Costa, MD   200 mg at 09/25/18 0846  . atorvastatin (LIPITOR) tablet 20 mg  20 mg Oral QHS Ivor Costa, MD   20 mg at 09/25/18 0235  . brimonidine (ALPHAGAN) 0.2 % ophthalmic solution 1 drop  1 drop Left Eye BID Ivor Costa, MD   1 drop at 09/25/18 0847   And  . timolol (TIMOPTIC) 0.5 % ophthalmic solution 1 drop  1 drop Left Eye BID Ivor Costa, MD   1 drop at 09/25/18 0847  . calcium acetate (PHOSLO) capsule 667 mg  667 mg Oral TID WC Ivor Costa, MD   667 mg at 09/25/18 0846  . carbamazepine (TEGRETOL XR) 12 hr tablet 100 mg  100 mg Oral BID Ivor Costa, MD   100 mg at 09/25/18 0846  . cefTRIAXone (ROCEPHIN) 1 g in sodium chloride 0.9 % 100 mL IVPB  1 g Intravenous Q24H Ivor Costa, MD   Stopped at 09/24/18 2129    . Chlorhexidine Gluconate Cloth 2 % PADS 6 each  6 each Topical Q0600 Valentina Gu, NP   6 each at 09/25/18 0848  . dextromethorphan-guaiFENesin (MUCINEX DM) 30-600 MG per 12 hr tablet 1 tablet  1 tablet Oral BID PRN Ivor Costa, MD   1 tablet at 09/25/18 0256  . docusate sodium (COLACE) capsule 100 mg  100 mg Oral BID Ivor Costa, MD   100 mg at 09/25/18 0846  . feeding supplement (PRO-STAT SUGAR FREE 64) liquid 30 mL  30 mL Oral BID Ivor Costa, MD   30 mL at 09/25/18 0845  . folic acid (FOLVITE) tablet 1 mg  1,000 mcg Oral Daily Ivor Costa, MD   1 mg at 09/25/18 0847  . heparin ADULT infusion 100 units/mL (25000 units/233mL sodium chloride 0.45%)  700 Units/hr Intravenous Continuous Bonnielee Haff, MD 7 mL/hr at 09/25/18 0942 700 Units/hr at 09/25/18 0942  . hydrALAZINE (APRESOLINE) injection 5 mg  5 mg Intravenous Q2H PRN Ivor Costa, MD      . hydrocortisone sodium succinate (SOLU-CORTEF) 100 MG injection 50 mg  50 mg Intravenous Q12H Ivor Costa, MD   50 mg at 09/25/18 0847  . hydroxychloroquine (PLAQUENIL) tablet 200 mg  200 mg Oral Daily Ivor Costa, MD   200 mg at 09/25/18 0847  . hydrOXYzine (ATARAX/VISTARIL) tablet 10 mg  10 mg Oral TID PRN Ivor Costa, MD      . insulin aspart (novoLOG) injection 0-5 Units  0-5 Units Subcutaneous QHS Ivor Costa, MD      . insulin aspart (novoLOG) injection 0-9 Units  0-9 Units Subcutaneous TID WC Ivor Costa, MD   1 Units at 09/25/18 0848  . latanoprost (XALATAN) 0.005 % ophthalmic solution 1 drop  1 drop Both Eyes QHS Ivor Costa, MD   1 drop at 09/25/18 0236  . [START ON 09/27/2018] metoprolol tartrate (LOPRESSOR) tablet 12.5 mg  12.5 mg Oral 2 times per day on Mon Wed Fri Niu, Soledad Gerlach, MD      . multivitamin (RENA-VIT) tablet 1 tablet  1 tablet Oral Daily Ivor Costa, MD   1 tablet at 09/25/18 0847  . oxyCODONE (Oxy IR/ROXICODONE) immediate release tablet 10 mg  10 mg Oral Q6H PRN Ivor Costa, MD  10 mg at 09/25/18 0116  . pantoprazole (PROTONIX) EC  tablet 40 mg  40 mg Oral Daily Ivor Costa, MD   40 mg at 09/25/18 0846  . Travoprost (BAK Free) (TRAVATAN) 0.004 % ophthalmic solution SOLN 1 drop  1 drop Both Eyes QHS Ivor Costa, MD   1 drop at 09/25/18 0242  . vancomycin (VANCOCIN) IVPB 750 mg/150 ml premix  750 mg Intravenous Q T,Th,Sa-HD Batchelder, Cecilio Asper, RPH      . zolpidem (AMBIEN) tablet 5 mg  5 mg Oral QHS PRN Ivor Costa, MD        REVIEW OF SYSTEMS:  [X]  denotes positive finding, [ ]  denotes negative finding Cardiac  Comments:  Chest pain or chest pressure:    Shortness of breath upon exertion:    Short of breath when lying flat:    Irregular heart rhythm:        Vascular    Pain in calf, thigh, or hip brought on by ambulation:    Pain in feet at night that wakes you up from your sleep:     Blood clot in your veins:    Leg swelling:           PHYSICAL EXAM: Vitals:   09/25/18 0105 09/25/18 0505 09/25/18 0815 09/25/18 0843  BP: (!) 187/36 (!) 139/38  (!) 142/53  Pulse: 78 69  (!) 59  Resp: 12 14  18   Temp: 100.1 F (37.8 C) 98.6 F (37 C)  98.2 F (36.8 C)  TempSrc: Oral Oral  Oral  SpO2: 100% 99% 100% 100%    GENERAL: The patient is a well-nourished female, in no acute distress. The vital signs are documented above. CARDIOVASCULAR: The patient does have a palpable left popliteal pulse. PULMONARY: There is good air exchange  MUSCULOSKELETAL: Right above-knee amputation NEUROLOGIC: No focal weakness or paresthesias are detected. SKIN: Separated skin edges over left transmetatarsal amputation cellulitis on her foot and distal calf.  Foul-smelling necrotic tissue present PSYCHIATRIC: The patient has a normal affect.  DATA:  Potassium 3.7 today.  MEDICAL ISSUES: I had an extensive discussion of over 30 minutes with the patient and her husband by telephone.  Again explained the only option would be higher level of amputation.  I agree with Dr. Scot Dock that above-knee amputation is warranted with her  contralateral above-knee amputation she is certainly not a prosthetic candidate.  Her husband asked specifically about this and I said that there is no realistic expectation of her walking on to above-knee prosthesis and that the goal would be for her to become independent transferring from bed to chair.  She may be able to heal a below-knee amputation but this would give her minimal added benefit and would certainly be much more risky of healing than above-knee amputation which is my recommendation is I agree with Dr. Nicole Cella initial recommendation from 6 weeks ago.  Have scheduled her for surgery tomorrow, Sunday, to expedite her ability to resolve this infection    Rosetta Posner, MD [facility] V[facility] and Ve[facility] Specialists of [facility] Tel [phone] Pager [phone]

## 2018-09-26 ENCOUNTER — Inpatient Hospital Stay (HOSPITAL_COMMUNITY): Payer: Medicare Other | Admitting: Certified Registered Nurse Anesthetist

## 2018-09-26 ENCOUNTER — Encounter (HOSPITAL_COMMUNITY)
Admission: EM | Disposition: A | Discharge status: Nursing Home (Includes ICF) | Payer: Self-pay | Source: Home / Self Care | Attending: Pulmonary Disease

## 2018-09-26 ENCOUNTER — Inpatient Hospital Stay: Admit: 2018-09-26 | Payer: Medicare Other | Admitting: Vascular Surgery

## 2018-09-26 HISTORY — PX: AMPUTATION: SHX166

## 2018-09-26 LAB — GLUCOSE, CAPILLARY
Glucose-Capillary: 157 mg/dL — ABNORMAL HIGH (ref 70–99)
Glucose-Capillary: 139 mg/dL — ABNORMAL HIGH (ref 70–99)
Glucose-Capillary: 145 mg/dL — ABNORMAL HIGH (ref 70–99)
Glucose-Capillary: 198 mg/dL — ABNORMAL HIGH (ref 70–99)
Glucose-Capillary: 151 mg/dL — ABNORMAL HIGH (ref 70–99)

## 2018-09-26 LAB — CBC
HCT: 28.6 % — ABNORMAL LOW (ref 36.0–46.0)
Hemoglobin: 8.4 g/dL — ABNORMAL LOW (ref 12.0–15.0)
MCH: 27.5 pg (ref 26.0–34.0)
MCHC: 29.4 g/dL — ABNORMAL LOW (ref 30.0–36.0)
MCV: 93.8 fL (ref 80.0–100.0)
NRBC: 0 % (ref 0.0–0.2)
Platelets: 171 10*3/uL (ref 150–400)
RBC: 3.05 MIL/uL — ABNORMAL LOW (ref 3.87–5.11)
RDW: 17.9 % — ABNORMAL HIGH (ref 11.5–15.5)
WBC: 9.9 10*3/uL (ref 4.0–10.5)

## 2018-09-26 LAB — RENAL FUNCTION PANEL
ALBUMIN: 1.6 g/dL — AB (ref 3.5–5.0)
Anion gap: 11 (ref 5–15)
BUN: 22 mg/dL (ref 8–23)
CO2: 27 mmol/L (ref 22–32)
Calcium: 7.6 mg/dL — ABNORMAL LOW (ref 8.9–10.3)
Chloride: 98 mmol/L (ref 98–111)
Creatinine, Ser: 2.93 mg/dL — ABNORMAL HIGH (ref 0.44–1.00)
GFR calc Af Amer: 18 mL/min — ABNORMAL LOW (ref >60)
GFR, EST NON AFRICAN AMERICAN: 15 mL/min — AB (ref >60)
Glucose, Bld: 175 mg/dL — ABNORMAL HIGH (ref 70–99)
Phosphorus: 3.8 mg/dL (ref 2.5–4.6)
Potassium: 3.2 mmol/L — ABNORMAL LOW (ref 3.5–5.1)
Sodium: 136 mmol/L (ref 135–145)

## 2018-09-26 LAB — URINALYSIS, ROUTINE W REFLEX MICROSCOPIC
Bilirubin Urine: NEGATIVE
Glucose, UA: NEGATIVE mg/dL
Ketones, ur: NEGATIVE mg/dL
Nitrite: NEGATIVE
PROTEIN: 100 mg/dL — AB
RBC / HPF: >50 RBC/hpf — ABNORMAL HIGH (ref 0–5)
Specific Gravity, Urine: 1.018 (ref 1.005–1.030)
pH: 5 (ref 5.0–8.0)

## 2018-09-26 LAB — PROTIME-INR
INR: 1.17
Prothrombin Time: 14.8 seconds (ref 11.4–15.2)

## 2018-09-26 LAB — APTT: aPTT: 87 seconds — ABNORMAL HIGH (ref 24–36)

## 2018-09-26 LAB — HEPARIN LEVEL (UNFRACTIONATED): HEPARIN UNFRACTIONATED: 1.26 [IU]/mL — AB (ref 0.30–0.70)

## 2018-09-26 SURGERY — AMPUTATION, ABOVE KNEE
Anesthesia: General | Laterality: Left

## 2018-09-26 MED ORDER — LIDOCAINE 2% (20 MG/ML) 5 ML SYRINGE
INTRAMUSCULAR | Status: AC
  Filled 2018-09-26: qty 5

## 2018-09-26 MED ORDER — LIDOCAINE HCL (CARDIAC) PF 100 MG/5ML IV SOSY
PREFILLED_SYRINGE | INTRAVENOUS | Status: DC | PRN
  Administered 2018-09-26: 100 mg via INTRATRACHEAL

## 2018-09-26 MED ORDER — PROMETHAZINE HCL 25 MG/ML IJ SOLN: 6.2500 mg | INTRAMUSCULAR | Status: DC | PRN

## 2018-09-26 MED ORDER — METOPROLOL TARTRATE 5 MG/5ML IV SOLN
2.0000 mg | INTRAVENOUS | Status: AC | PRN
  Administered 2018-10-07: 5 mg via INTRAVENOUS
  Administered 2018-10-12: 2.5 mg via INTRAVENOUS
  Filled 2018-09-26 (×3): qty 5

## 2018-09-26 MED ORDER — FENTANYL CITRATE (PF) 250 MCG/5ML IJ SOLN
INTRAMUSCULAR | Status: DC | PRN
  Administered 2018-09-26: 100 ug via INTRAVENOUS
  Administered 2018-09-26 (×2): 25 ug via INTRAVENOUS

## 2018-09-26 MED ORDER — SODIUM CHLORIDE 0.9 % IV SOLN
INTRAVENOUS | Status: DC | PRN
  Administered 2018-09-26: 07:00:00 via INTRAVENOUS

## 2018-09-26 MED ORDER — MIDAZOLAM HCL 2 MG/2ML IJ SOLN
INTRAMUSCULAR | Status: AC
  Filled 2018-09-26: qty 2

## 2018-09-26 MED ORDER — SUGAMMADEX SODIUM 500 MG/5ML IV SOLN
INTRAVENOUS | Status: AC
  Filled 2018-09-26: qty 5

## 2018-09-26 MED ORDER — EPHEDRINE SULFATE 50 MG/ML IJ SOLN
INTRAMUSCULAR | Status: DC | PRN
  Administered 2018-09-26: 5 mg via INTRAVENOUS

## 2018-09-26 MED ORDER — SUCCINYLCHOLINE CHLORIDE 20 MG/ML IJ SOLN
INTRAMUSCULAR | Status: DC | PRN
  Administered 2018-09-26: 80 mg via INTRAVENOUS

## 2018-09-26 MED ORDER — ONDANSETRON HCL 4 MG/2ML IJ SOLN
INTRAMUSCULAR | Status: DC | PRN
  Administered 2018-09-26: 4 mg via INTRAVENOUS

## 2018-09-26 MED ORDER — LABETALOL HCL 5 MG/ML IV SOLN
10.0000 mg | INTRAVENOUS | Status: DC | PRN
  Filled 2018-09-26: qty 4

## 2018-09-26 MED ORDER — ROCURONIUM BROMIDE 50 MG/5ML IV SOSY
PREFILLED_SYRINGE | INTRAVENOUS | Status: AC
  Filled 2018-09-26: qty 5

## 2018-09-26 MED ORDER — POTASSIUM CHLORIDE CRYS ER 20 MEQ PO TBCR: 20.0000 meq | EXTENDED_RELEASE_TABLET | Freq: Every day | ORAL | Status: DC | PRN

## 2018-09-26 MED ORDER — ONDANSETRON HCL 4 MG/2ML IJ SOLN
INTRAMUSCULAR | Status: AC
  Filled 2018-09-26: qty 2

## 2018-09-26 MED ORDER — DIPHENHYDRAMINE HCL 50 MG/ML IJ SOLN
INTRAMUSCULAR | Status: AC
  Filled 2018-09-26: qty 1

## 2018-09-26 MED ORDER — HYDROMORPHONE HCL 1 MG/ML IJ SOLN: 0.2500 mg | INTRAMUSCULAR | Status: DC | PRN

## 2018-09-26 MED ORDER — MEPERIDINE HCL 50 MG/ML IJ SOLN: 6.2500 mg | INTRAMUSCULAR | Status: DC | PRN

## 2018-09-26 MED ORDER — ONDANSETRON HCL 4 MG/2ML IJ SOLN: 4.0000 mg | Freq: Four times a day (QID) | INTRAMUSCULAR | Status: DC | PRN

## 2018-09-26 MED ORDER — 0.9 % SODIUM CHLORIDE (POUR BTL) OPTIME
TOPICAL | Status: DC | PRN
  Administered 2018-09-26: 1000 mL

## 2018-09-26 MED ORDER — SODIUM CHLORIDE 0.9% FLUSH
3.0000 mL | Freq: Two times a day (BID) | INTRAVENOUS | Status: DC
  Administered 2018-09-26 – 2018-10-18 (×34): 3 mL via INTRAVENOUS
  Administered 2018-10-18: 12:00:00 via INTRAVENOUS
  Administered 2018-10-19 – 2018-10-23 (×8): 3 mL via INTRAVENOUS

## 2018-09-26 MED ORDER — HYDROCORTISONE NA SUCCINATE PF 100 MG IJ SOLR
25.0000 mg | Freq: Two times a day (BID) | INTRAMUSCULAR | Status: AC
  Administered 2018-09-26 – 2018-09-27 (×3): 25 mg via INTRAVENOUS
  Filled 2018-09-26 (×3): qty 0.5

## 2018-09-26 MED ORDER — LORAZEPAM 0.5 MG PO TABS
0.5000 mg | ORAL_TABLET | Freq: Once | ORAL | Status: AC
  Administered 2018-09-26: 0.5 mg via ORAL
  Filled 2018-09-26: qty 1

## 2018-09-26 MED ORDER — ARTIFICIAL TEARS OPHTHALMIC OINT
TOPICAL_OINTMENT | OPHTHALMIC | Status: AC
  Filled 2018-09-26: qty 3.5

## 2018-09-26 MED ORDER — PHENYLEPHRINE 40 MCG/ML (10ML) SYRINGE FOR IV PUSH (FOR BLOOD PRESSURE SUPPORT)
PREFILLED_SYRINGE | INTRAVENOUS | Status: AC
  Filled 2018-09-26: qty 10

## 2018-09-26 MED ORDER — PROPOFOL 10 MG/ML IV BOLUS
INTRAVENOUS | Status: DC | PRN
  Administered 2018-09-26: 100 mg via INTRAVENOUS

## 2018-09-26 MED ORDER — SODIUM CHLORIDE (PF) 0.9 % IJ SOLN
INTRAMUSCULAR | Status: AC
  Filled 2018-09-26: qty 10

## 2018-09-26 MED ORDER — MAGNESIUM SULFATE 2 GM/50ML IV SOLN
2.0000 g | Freq: Every day | INTRAVENOUS | Status: DC | PRN
  Filled 2018-09-26: qty 50

## 2018-09-26 MED ORDER — SODIUM CHLORIDE 0.9 % IV SOLN
250.0000 mL | INTRAVENOUS | Status: DC | PRN
  Administered 2018-10-02 – 2018-10-10 (×5): 250 mL via INTRAVENOUS

## 2018-09-26 MED ORDER — EPHEDRINE 5 MG/ML INJ
INTRAVENOUS | Status: AC
  Filled 2018-09-26: qty 10

## 2018-09-26 MED ORDER — FENTANYL CITRATE (PF) 250 MCG/5ML IJ SOLN
INTRAMUSCULAR | Status: AC
  Filled 2018-09-26: qty 5

## 2018-09-26 MED ORDER — ALUM & MAG HYDROXIDE-SIMETH 200-200-20 MG/5ML PO SUSP: 15.0000 mL | ORAL | Status: DC | PRN

## 2018-09-26 MED ORDER — SUCCINYLCHOLINE CHLORIDE 200 MG/10ML IV SOSY
PREFILLED_SYRINGE | INTRAVENOUS | Status: AC
  Filled 2018-09-26: qty 10

## 2018-09-26 MED ORDER — PROPOFOL 10 MG/ML IV BOLUS
INTRAVENOUS | Status: AC
  Filled 2018-09-26: qty 20

## 2018-09-26 MED ORDER — PHENOL 1.4 % MT LIQD: 1.0000 | OROMUCOSAL | Status: DC | PRN

## 2018-09-26 MED ORDER — SODIUM CHLORIDE 0.9% FLUSH: 3.0000 mL | INTRAVENOUS | Status: DC | PRN

## 2018-09-26 SURGICAL SUPPLY — 50 items
BANDAGE ACE 4X5 VEL STRL LF (GAUZE/BANDAGES/DRESSINGS) ×3 IMPLANT
BANDAGE ACE 6X5 VEL STRL LF (GAUZE/BANDAGES/DRESSINGS) ×3 IMPLANT
BANDAGE ELASTIC 4 VELCRO ST LF (GAUZE/BANDAGES/DRESSINGS) ×2 IMPLANT
BANDAGE ELASTIC 6 VELCRO ST LF (GAUZE/BANDAGES/DRESSINGS) ×2 IMPLANT
BANDAGE ESMARK 6X9 LF (GAUZE/BANDAGES/DRESSINGS) IMPLANT
BLADE SAW GIGLI 510 (BLADE) ×2 IMPLANT
BLADE SAW GIGLI 510MM (BLADE) ×1
BNDG CMPR 9X6 STRL LF SNTH (GAUZE/BANDAGES/DRESSINGS)
BNDG ESMARK 6X9 LF (GAUZE/BANDAGES/DRESSINGS)
BNDG GAUZE ELAST 4 BULKY (GAUZE/BANDAGES/DRESSINGS) ×4 IMPLANT
CANISTER SUCT 3000ML PPV (MISCELLANEOUS) ×3 IMPLANT
CLIP LIGATING EXTRA MED SLVR (CLIP) ×3 IMPLANT
CLIP LIGATING EXTRA SM BLUE (MISCELLANEOUS) ×3 IMPLANT
COVER SURGICAL LIGHT HANDLE (MISCELLANEOUS) ×4 IMPLANT
COVER WAND RF STERILE (DRAPES) ×1 IMPLANT
CUFF TOURNIQUET SINGLE 34IN LL (TOURNIQUET CUFF) IMPLANT
CUFF TOURNIQUET SINGLE 44IN (TOURNIQUET CUFF) IMPLANT
DRAIN SNY 10X20 3/4 PERF (WOUND CARE) IMPLANT
DRAPE HALF SHEET 40X57 (DRAPES) ×5 IMPLANT
DRAPE ORTHO SPLIT 77X108 STRL (DRAPES) ×6
DRAPE SURG ORHT 6 SPLT 77X108 (DRAPES) ×2 IMPLANT
ELECT CAUTERY BLADE 6.4 (BLADE) ×1 IMPLANT
ELECT REM PT RETURN 9FT ADLT (ELECTROSURGICAL) ×3
ELECTRODE REM PT RTRN 9FT ADLT (ELECTROSURGICAL) ×1 IMPLANT
EVACUATOR SILICONE 100CC (DRAIN) IMPLANT
GAUZE SPONGE 4X4 12PLY STRL (GAUZE/BANDAGES/DRESSINGS) ×6 IMPLANT
GAUZE SPONGE 4X4 12PLY STRL LF (GAUZE/BANDAGES/DRESSINGS) ×2 IMPLANT
GAUZE XEROFORM 5X9 LF (GAUZE/BANDAGES/DRESSINGS) ×3 IMPLANT
GLOVE BIO SURGEON STRL SZ7.5 (GLOVE) ×2 IMPLANT
GLOVE BIOGEL PI IND STRL 7.5 (GLOVE) IMPLANT
GLOVE BIOGEL PI INDICATOR 7.5 (GLOVE) ×6
GLOVE SS BIOGEL STRL SZ 7.5 (GLOVE) ×1 IMPLANT
GLOVE SUPERSENSE BIOGEL SZ 7.5 (GLOVE) ×2
GOWN STRL REUS W/ TWL LRG LVL3 (GOWN DISPOSABLE) ×3 IMPLANT
GOWN STRL REUS W/TWL LRG LVL3 (GOWN DISPOSABLE) ×9
KIT BASIN OR (CUSTOM PROCEDURE TRAY) ×3 IMPLANT
KIT TURNOVER KIT B (KITS) ×3 IMPLANT
NS IRRIG 1000ML POUR BTL (IV SOLUTION) ×3 IMPLANT
PACK GENERAL/GYN (CUSTOM PROCEDURE TRAY) ×3 IMPLANT
PAD ARMBOARD 7.5X6 YLW CONV (MISCELLANEOUS) ×6 IMPLANT
PADDING CAST COTTON 6X4 STRL (CAST SUPPLIES) IMPLANT
STAPLER VISISTAT 35W (STAPLE) ×3 IMPLANT
STOCKINETTE IMPERVIOUS LG (DRAPES) ×3 IMPLANT
SUT ETHILON 3 0 PS 1 (SUTURE) IMPLANT
SUT VIC AB 0 CT1 18XCR BRD 8 (SUTURE) ×2 IMPLANT
SUT VIC AB 0 CT1 8-18 (SUTURE) ×6
SUT VICRYL AB 2 0 TIES (SUTURE) ×3 IMPLANT
TOWEL GREEN STERILE (TOWEL DISPOSABLE) ×6 IMPLANT
UNDERPAD 30X30 (UNDERPADS AND DIAPERS) ×3 IMPLANT
WATER STERILE IRR 1000ML POUR (IV SOLUTION) ×3 IMPLANT

## 2018-09-26 NOTE — Progress Notes (Signed)
PT Cancellation Note  Patient Details Name: [name] MRN: [id number] DOB: 02/03/1946   Cancelled Treatment:    Reason Eval/Treat Not Completed: Other (comment)   Politely declined PT at this time;   Will follow-up for PT eval tomorrow;   [name], [place] Pager [phone] Office [phone]    [name] 09/26/2018, 2:01 PM

## 2018-09-26 NOTE — Op Note (Signed)
    OPERATIVE REPORT  DATE OF SURGERY: 09/26/2018  PATIENT: [name], 72 y.o. female MRN: [id number]  DOB: [birth year]  PRE-OPERATIVE DIAGNOSIS: Gangrene left foot  POST-OPERATIVE DIAGNOSIS:  Same  PROCEDURE: Left above-knee amputation  SURGEON:  Curt Jews, M.D.  PHYSICIAN ASSISTANT: Nurse  ANESTHESIA: General  EBL: per anesthesia record  Total I/O In: 325 [I.V.:325] Out: 75 [Blood:75]  BLOOD ADMINISTERED: none  DRAINS: none  SPECIMEN: none  COUNTS CORRECT:  YES  PATIENT DISPOSITION:  PACU - hemodynamically stable  PROCEDURE DETAILS: Patient was taken to the operating placed to position with area of the left leg was prepped draped you sterile fashion.  [place] incision was made in the level of the distal thigh just above the knee.  This was carried down through the subcutaneous fat and fascia.  The muscle was all viable but was minimal bleeding from the muscle.  The superficial femoral artery was patent this was doubly ligated with 0 Vicryl ties and divided.  The femoral vein was also ligated and divided.  The saphenous vein was ligated and divided.  Dissection was continued through the muscle down to the level of the femur.  The sciatic nerve was mobilized further proximally and was ligated and divided.  Periosteum was elevated off the femur and the femur was divided with a Gigli saw.  The bone edges were smoothed with a bone rasp.  Irrigated with saline.  Hemostasis obtained after cautery.  The wounds were closed with 0 Vicryl figure-of-eight sutures in the fascia to close the anterior fascia of the posterior fascia.  The skin was closed with skin staples.  Sterile dressing and Ace wrap were applied the patient was transferred to the recovery room in stable condition   Rosetta Posner, M.D., [facility] 09/26/2018 9:21 AM

## 2018-09-26 NOTE — Interval H&P Note (Signed)
History and Physical Interval Note:  09/26/2018 7:23 AM  [name]  has presented today for surgery, with the diagnosis of left lower extremity gangrene  The various methods of treatment have been discussed with the patient and family. After consideration of risks, benefits and other options for treatment, the patient has consented to  Procedure(s) with comments: AMPUTATION ABOVE KNEE (Left) - WILL BE AN INPATIENT as a surgical intervention .  The patient's history has been reviewed, patient examined, no change in status, stable for surgery.  I have reviewed the patient's chart and labs.  Questions were answered to the patient's satisfaction.     Curt Jews

## 2018-09-26 NOTE — Progress Notes (Signed)
[facility] Progress Note   72 y.o. female with ESRD on hemodialysis T,Th,S at [facility]. ESRD  collapsing focal segmental nephrosclerosis, SLE,  DM, gout, COPD, Hx A fib/flutter (on Eliquis), chronic dCHF, Hx DVT (2015), PAD w/ AKA 05/2018 L TMA 09/06/18 and Lt AKA 09/26/18 .   [facility] TTS 3.5hr EDW 61kg 180NRe 400/800 2/2  180NRe 400/800 Heparin 1850 unit bolus Mircera 225 q2weeks (last 09/14/18) Assessment/ Plan:   1.  Non-healing L TMA/Cellulitis LLE-on Vanc/ceftriaxone  per primary. To OR tomorrow for L AKA per Dr. Donnetta Hutching.  2.  ESRD -  T,Th,S. HD today on schedule. K+ 3.7. -  Last HD Sat; will plan on next HD Tuesday. -  No heparin bolus on HD. 3.  Hypertension/volume  - Left under EDW last tx. 4.  Anemia  -HGB 8.8. Give Aranesp 200 mcg IV given 12/28 with HD.  5.  Metabolic bone disease -  Add RFP to today's labs. Ca 7.8 C Ca 9.5. No VDRA, continue binders (phoslo TIDM) 6.  Nutrition - Albumin 1.9 PCM/poor PO intake. Renal/Carb mod diet, prostat. Treat oral thrush.  7.  SLE-on plaquenil/stress dose solu-cortef, prednisone. .  8.  DM- per primary 9.  Failed AVF-ask [facility] to see whether they would like to place new perm access while in hospital vs as outpt. With recent infection in foot may want to do as outpt in case a AVG needs to be placed but will defer to [facility]. - Lt BBF placed by Dr. Oneida Alar 01/13/2018 w/ exploration by Dr. Scot Dock 03/15/2018 for hematoma and small overlying wound. Now noted to be thrombosed; noted to be thrombosed in late November 2019. 10.  Oral thrush-clotrimazole troches 10 mg PO 5 x daily for 14 days 11.  Afib-on amiodarone  12.  HFpEF-last EF 55-60% 10/2017. Monitor volume status carefully 13.  H/O gout-continue allopurinol 14.  Pain R side/R pelvis-check flat flat of abdomen   Subjective:   Denies f/c/n/v/dyspnea Pain understandably in surgical zone, also mild lower abd discomfort.   Objective:   BP (!) 150/49 (BP Location: Right  Arm)   Pulse 70   Temp 98.4 F (36.9 C) (Oral)   Resp 12   Ht 5' (1.524 m)   Wt 59.2 kg   SpO2 100%   BMI 25.49 kg/m   Intake/Output Summary (Last 24 hours) at 09/26/2018 1048 Last data filed at 09/26/2018 0915 Gross per 24 hour  Intake 596.06 ml  Output 2605 ml  Net -2008.94 ml   Weight change:   Physical Exam: General: Chronically ill appearing female, NAD Lungs: Bilateral breath sounds, rhonchi Heart: RRR with S1 S2 Abdomen: Soft, tender RUQ/RLG. Decr  BS.  Lower Extremities-R AKA no stump edema. Trace LLE edema. LLE mottled cool to touch with crusted dry drainage along TMA Warm to mid calf,  suture line, foul odor, blackened margins along suture line.   Neuro: Alert and oriented X 2. Moves all extremities spontaneously. Psych:  Responds to questions slowly but appropriately Dialysis Access: L AVF-no bruit. RIJ TDC drsg intact  Imaging: Dg Abd 1 View  Result Date: 09/25/2018 CLINICAL DATA:  Abdominal and right hip pain. EXAM: ABDOMEN - 1 VIEW COMPARISON:  07/28/2018 CT and 12/16/2017 abdomen radiograph FINDINGS: Scattered air containing small large bowel without bowel obstruction. Lumbar spondylosis with degenerative disc disease is identified greatest at L2-3. Mild joint space narrowing of both hips. No radiopaque calculi nor acute osseous abnormality. IMPRESSION: Nonspecific bowel gas pattern without bowel obstruction. Lumbar spondylosis.  Electronically Signed   By: Ashley Royalty M.D.   On: 09/25/2018 22:37   Ct Head Wo Contrast  Result Date: 09/24/2018 CLINICAL DATA:  Encephalopathy EXAM: CT HEAD WITHOUT CONTRAST TECHNIQUE: Contiguous axial images were obtained from the base of the skull through the vertex without intravenous contrast. COMPARISON:  MRI 12/26/2017 FINDINGS: Brain: Small left parafalcine meningioma measuring 7 x 3 x 6 mm, stable in appearance. Patchy periventricular white matter hypodensities compatible with chronic microvascular ischemia. No large vascular  territory infarct. Chronic involutional changes brain with sulcal and ventricular prominence. Intra-axial mass nor extra-axial fluid. No acute intracranial hemorrhage. Small chronic basal ganglia lacunar infarcts are again noted. Midline fourth ventricle basal cisterns without effacement. Brainstem and cerebellum appear nonacute. Vascular: No hyperdense vessel sign. Skull: Intact Sinuses/Orbits: Bilateral lens replacements. Slight lateral deviation of gaze involving the right globe. No acute sinus disease. Other: No significant calvarial soft tissue swelling. IMPRESSION: 1. Stable small left parafalcine meningioma measuring 7 x 3 x 6 mm. 2. Chronic small vessel ischemic disease. No acute intracranial abnormality. Electronically Signed   By: Ashley Royalty M.D.   On: 09/24/2018 21:44   Dg Chest Port 1 View  Result Date: 09/24/2018 CLINICAL DATA:  Fever over diabetic foot wound. EXAM: PORTABLE CHEST 1 VIEW COMPARISON:  03/01/2018 FINDINGS: Stable cardiomegaly with aortic atherosclerosis. Right IJ approach dialysis catheter is noted with tip in the proximal right atrium. Lungs are clear without edema or consolidation. No effusion or pneumothorax. No acute osseous appearing abnormality. IMPRESSION: No active disease.  Stable cardiomegaly with aortic atherosclerosis. Electronically Signed   By: Ashley Royalty M.D.   On: 09/24/2018 17:50   Dg Hip Unilat With Pelvis 1v Right  Result Date: 09/25/2018 CLINICAL DATA:  Right hip pain EXAM: DG HIP (WITH OR WITHOUT PELVIS) 1V RIGHT COMPARISON:  None. FINDINGS: L4-5 degenerative disc disease with lower lumbar facet arthropathy is seen. The bony pelvis appears intact without acute fracture or suspicious osseous lesions. No pelvic diastasis is noted. Mild-to-moderate joint space narrowing of both hips appears stable. Mild subchondral sclerosis of the femoral heads are identified without flattening. Findings may be degenerative in etiology. Subtle changes of AVN are not entirely  excluded. Soft tissue calcification adjacent to the greater trochanter of the right femur may reflect gluteal tendinosis. IMPRESSION: 1. No acute osseous abnormality of the bony pelvis and right hip. 2. Mild-to-moderate degenerative joint space narrowing of both hips. Subchondral sclerosis of the femoral heads bilaterally without flattening may reflect osteoarthritis. Changes of AVN are not entirely excluded. 3. Soft tissue calcification adjacent to the greater trochanter of the right femur may reflect gluteal tendinosis. Electronically Signed   By: Ashley Royalty M.D.   On: 09/25/2018 22:40    Labs: BMET Recent Labs  Lab 09/24/18 1749 09/24/18 1821 09/25/18 0626 09/25/18 1221 09/26/18 0552  NA 133* 132* 133* 132* 136  K 3.6 3.8 3.7 4.2 3.2*  CL 94* 95* 94* 95* 98  CO2 31  --  25 24 27   GLUCOSE 114* 107* 144* 162* 175*  BUN 38* 48* 49* 54* 22  CREATININE 4.22* 4.30* 4.91* 5.11* 2.93*  CALCIUM 7.8*  --  7.6* 7.6* 7.6*  PHOS  --   --   --  6.9* 3.8   CBC Recent Labs  Lab 09/24/18 1749 09/24/18 1821 09/25/18 0626 09/26/18 0552  WBC 7.1  --  13.0* 9.9  NEUTROABS 5.9  --   --   --   HGB 9.5* 11.9* 8.2* 8.4*  HCT  33.2* 35.0* 28.3* 28.6*  MCV 94.9  --  93.4 93.8  PLT 192  --  169 171    Medications:    . [START ON 09/27/2018] allopurinol  100 mg Oral Q M,W,F  . amiodarone  200 mg Oral Daily  . atorvastatin  20 mg Oral QHS  . brimonidine  1 drop Left Eye BID   And  . timolol  1 drop Left Eye BID  . calcium acetate  667 mg Oral TID WC  . carbamazepine  100 mg Oral BID  . Chlorhexidine Gluconate Cloth  6 each Topical Q0600  . clotrimazole  10 mg Oral 5 X Daily  . darbepoetin (ARANESP) injection - DIALYSIS  200 mcg Intravenous Q Sat-HD  . docusate sodium  100 mg Oral BID  . feeding supplement (PRO-STAT SUGAR FREE 64)  30 mL Oral BID  . heparin  1,900 Units Dialysis Once in dialysis  . hydrocortisone sod succinate (SOLU-CORTEF) inj  50 mg Intravenous Q12H  . hydroxychloroquine   200 mg Oral Daily  . insulin aspart  0-5 Units Subcutaneous QHS  . insulin aspart  0-9 Units Subcutaneous TID WC  . latanoprost  1 drop Both Eyes QHS  . [START ON 09/27/2018] metoprolol tartrate  12.5 mg Oral 2 times per day on Mon Wed Fri  . multivitamin  1 tablet Oral Daily  . pantoprazole  40 mg Oral Daily  . sodium chloride flush  3 mL Intravenous Q12H  . Travoprost (BAK Free)  1 drop Both Eyes QHS      Otelia Santee, MD 09/26/2018, 10:48 AM

## 2018-09-26 NOTE — Anesthesia Procedure Notes (Signed)
Procedure Name: Intubation Date/Time: 09/26/2018 7:51 AM Performed by: Clovis Cao, CRNA Pre-anesthesia Checklist: Patient identified, Emergency Drugs available, Suction available, Patient being monitored and Timeout performed Patient Re-evaluated:Patient Re-evaluated prior to induction Oxygen Delivery Method: Circle system utilized Preoxygenation: Pre-oxygenation with 100% oxygen Induction Type: IV induction Ventilation: Mask ventilation without difficulty Laryngoscope Size: Miller and 2 Grade View: Grade I Tube type: Oral Tube size: 7.0 mm Number of attempts: 1 Airway Equipment and Method: Stylet Placement Confirmation: ETT inserted through vocal cords under direct vision,  positive ETCO2 and breath sounds checked- equal and bilateral Secured at: 21 cm Tube secured with: Tape Dental Injury: Teeth and Oropharynx as per pre-operative assessment

## 2018-09-26 NOTE — Anesthesia Postprocedure Evaluation (Signed)
Anesthesia Post Note  Patient: [name]  Procedure(s) Performed: AMPUTATION ABOVE KNEE (Left )     Patient location during evaluation: PACU Anesthesia Type: General Level of consciousness: sedated and patient cooperative Pain management: pain level controlled Vital Signs Assessment: post-procedure vital signs reviewed and stable Respiratory status: spontaneous breathing Cardiovascular status: stable Anesthetic complications: no    Last Vitals:  Vitals:   09/26/18 1226 09/26/18 1649  BP: (!) 163/54 (!) 158/49  Pulse: 74 68  Resp: 16 16  Temp: (!) 36.4 C 37.1 C  SpO2: 100% 100%    Last Pain:  Vitals:   09/26/18 1649  TempSrc: Oral  PainSc:                  [name]

## 2018-09-26 NOTE — Plan of Care (Signed)
  Problem: Pain Managment: Goal: General experience of comfort will improve Outcome: Progressing   Problem: Safety: Goal: Ability to remain free from injury will improve Outcome: Progressing   Problem: Skin Integrity: Goal: Risk for impaired skin integrity will decrease Outcome: Progressing   

## 2018-09-26 NOTE — Transfer of Care (Signed)
Immediate Anesthesia Transfer of Care Note  Patient: [name]  Procedure(s) Performed: AMPUTATION ABOVE KNEE (Left )  Patient Location: PACU  Anesthesia Type:General  Level of Consciousness: awake  Airway & Oxygen Therapy: Patient Spontanous Breathing and Patient connected to nasal cannula oxygen  Post-op Assessment: Report given to RN and Post -op Vital signs reviewed and stable  Post vital signs: Reviewed and stable  Last Vitals:  Vitals Value Taken Time  BP 143/42 09/26/2018  8:59 AM  Temp    Pulse 71 09/26/2018  8:59 AM  Resp 20 09/26/2018  8:59 AM  SpO2 95 % 09/26/2018  8:59 AM  Vitals shown include unvalidated device data.  Last Pain:  Vitals:   09/26/18 0330  TempSrc: Oral  PainSc:          Complications: No apparent anesthesia complications

## 2018-09-26 NOTE — Progress Notes (Signed)
[facility] PROGRESS NOTE  [name] VUY:[id number] DOB: February 28, 1946 DOA: 09/24/2018  PCP: Dana Sites, MD  Brief History/Interval Summary: 72 y.o. female with medical history significant of hypertension, hyperlipidemia, diabetes mellitus, COPD, GERD, gout, and anemia, atrial fibrillation and DVT on Eliquis,  chronic diastolic CHF, ESRD-HD (TTS), lupus, PAD, s/p of R AKA, who presented with left foot pain.  She recently had a left transmetatarsal amputation by Dr. Doren Custard on 12/9.  Was found to have infection in that area follow-up in vascular surgery office.  She was sent over for hospitalization and further management.  Reason for Visit: Infection involving left foot wound.  Concern for gangrene  Consultants: Vascular surgery.  Nephrology  Procedures: None yet  Antibiotics: Vancomycin and ceftriaxone  Subjective/Interval History: Patient seen after her surgery.  She states that she is feeling well.  Pain is 7 out of 10 in intensity.  She also mentions that she is noticed some twitching in her right forearm for the last 3 to 4 days.  Denies any history of seizure disorder.  No headaches currently.    ROS: No shortness of breath.  Objective:  Vital Signs  Vitals:   09/26/18 0915 09/26/18 0916 09/26/18 0930 09/26/18 1226  BP: (!) 148/39  (!) 150/49 (!) 163/54  Pulse: 66 67 70 74  Resp: (!) _0 Temp: 98.1 F (36.7 C)  98.4 F (36.9 C) (!) 97.5 F (36.4 C)  TempSrc:   Oral Oral  SpO2: 100% 100% 100% 100%  Weight:      Height:        Intake/Output Summary (Last 24 hours) at 09/26/2018 1311 Last data filed at 09/26/2018 0915 Gross per 24 hour  Intake 356.06 ml  Output 2605 ml  Net -2248.94 ml   Filed Weights   09/25/18 1320 09/25/18 1746  Weight: 61.8 kg 59.2 kg    General appearance: Alert.  Distracted.  In no distress Resp: Normal effort at rest.  Clear to auscultation bilaterally.  Diminished air entry at the bases. Cardio: S1-S2 is  normal regular.  No S3-S4.  No rubs murmurs or bruit GI: Abdomen is soft.  Nondistended.  Tenderness in the right side without any rebound rigidity or guarding.  Bowel sounds are present normal. Extremities: Left lower extremity covered in dressing Neurologic: Alert.  No facial asymmetry.  No focal deficits.  Twitching of right forearm noted.  Lab Results:  Data Reviewed: I have personally reviewed following labs and imaging studies  CBC: Recent Labs  Lab 09/24/18 1749 09/24/18 1821 09/25/18 0626 09/26/18 0552  WBC 7.1  --  13.0* 9.9  NEUTROABS 5.9  --   --   --   HGB 9.5* 11.9* 8.2* 8.4*  HCT 33.2* 35.0* 28.3* 28.6*  MCV 94.9  --  93.4 93.8  PLT 192  --  169 168    Basic Metabolic Panel: Recent Labs  Lab 09/24/18 1749 09/24/18 1821 09/25/18 0626 09/25/18 1221 09/26/18 0552  NA 133* 132* 133* 132* 136  K 3.6 3.8 3.7 4.2 3.2*  CL 94* 95* 94* 95* 98  CO2 31  --  _1 GLUCOSE 114* 107* 144* 162* 175*  BUN 38* 48* 49* 54* 22  CREATININE 4.22* 4.30* 4.91* 5.11* 2.93*  CALCIUM 7.8*  --  7.6* 7.6* 7.6*  PHOS  --   --   --  6.9* 3.8    GFR: Estimated Creatinine Clearance: 14 mL/min (A) (by C-G formula based on SCr of  2.93 mg/dL (H)).  Liver Function Tests: Recent Labs  Lab 09/24/18 1749 09/25/18 1221 09/26/18 0552  AST 31  --   --   ALT 9  --   --   ALKPHOS 72  --   --   BILITOT 0.6  --   --   PROT 6.3*  --   --   ALBUMIN 1.9* 1.6* 1.6*    CBG: Recent Labs  Lab 09/25/18 1113 09/25/18 2126 09/26/18 0558 09/26/18 0900 09/26/18 1151  GLUCAP 149* 134* 157* 139* 145*     Recent Results (from the past 240 hour(s))  Blood Culture (routine x 2)     Status: None (Preliminary result)   Collection Time: 09/24/18  5:19 PM  Result Value Ref Range Status   Specimen Description BLOOD RIGHT ANTECUBITAL  Final   Special Requests   Final    BOTTLES DRAWN AEROBIC AND ANAEROBIC Blood Culture adequate volume   Culture   Final    NO GROWTH 2 DAYS Performed at  [facility], [place] [address], [place], [place] [address]    Report Status PENDING  Incomplete  Blood Culture (routine x 2)     Status: None (Preliminary result)   Collection Time: 09/24/18  5:24 PM  Result Value Ref Range Status   Specimen Description BLOOD BLOOD RIGHT HAND  Final   Special Requests   Final    BOTTLES DRAWN AEROBIC AND ANAEROBIC Blood Culture adequate volume   Culture   Final    NO GROWTH 2 DAYS Performed at [facility], [place] [address], [place], [place] [address]    Report Status PENDING  Incomplete      Radiology Studies: Dg Abd 1 View  Result Date: 09/25/2018 CLINICAL DATA:  Abdominal and right hip pain. EXAM: ABDOMEN - 1 VIEW COMPARISON:  07/28/2018 CT and 12/16/2017 abdomen radiograph FINDINGS: Scattered air containing small large bowel without bowel obstruction. Lumbar spondylosis with degenerative disc disease is identified greatest at L2-3. Mild joint space narrowing of both hips. No radiopaque calculi nor acute osseous abnormality. IMPRESSION: Nonspecific bowel gas pattern without bowel obstruction. Lumbar spondylosis. Electronically Signed   By: Ashley Royalty M.D.   On: 09/25/2018 22:37   Ct Head Wo Contrast  Result Date: 09/24/2018 CLINICAL DATA:  Encephalopathy EXAM: CT HEAD WITHOUT CONTRAST TECHNIQUE: Contiguous axial images were obtained from the base of the skull through the vertex without intravenous contrast. COMPARISON:  MRI 12/26/2017 FINDINGS: Brain: Small left parafalcine meningioma measuring 7 x 3 x 6 mm, stable in appearance. Patchy periventricular white matter hypodensities compatible with chronic microvascular ischemia. No large vascular territory infarct. Chronic involutional changes brain with sulcal and ventricular prominence. Intra-axial mass nor extra-axial fluid. No acute intracranial hemorrhage. Small chronic basal ganglia lacunar infarcts are again noted. Midline fourth ventricle basal cisterns without effacement. Brainstem and  cerebellum appear nonacute. Vascular: No hyperdense vessel sign. Skull: Intact Sinuses/Orbits: Bilateral lens replacements. Slight lateral deviation of gaze involving the right globe. No acute sinus disease. Other: No significant calvarial soft tissue swelling. IMPRESSION: 1. Stable small left parafalcine meningioma measuring 7 x 3 x 6 mm. 2. Chronic small vessel ischemic disease. No acute intracranial abnormality. Electronically Signed   By: Ashley Royalty M.D.   On: 09/24/2018 21:44   Dg Chest Port 1 View  Result Date: 09/24/2018 CLINICAL DATA:  Fever over diabetic foot wound. EXAM: PORTABLE CHEST 1 VIEW COMPARISON:  03/01/2018 FINDINGS: Stable cardiomegaly with aortic atherosclerosis. Right IJ approach dialysis catheter is  noted with tip in the proximal right atrium. Lungs are clear without edema or consolidation. No effusion or pneumothorax. No acute osseous appearing abnormality. IMPRESSION: No active disease.  Stable cardiomegaly with aortic atherosclerosis. Electronically Signed   By: Ashley Royalty M.D.   On: 09/24/2018 17:50   Dg Hip Unilat With Pelvis 1v Right  Result Date: 09/25/2018 CLINICAL DATA:  Right hip pain EXAM: DG HIP (WITH OR WITHOUT PELVIS) 1V RIGHT COMPARISON:  None. FINDINGS: L4-5 degenerative disc disease with lower lumbar facet arthropathy is seen. The bony pelvis appears intact without acute fracture or suspicious osseous lesions. No pelvic diastasis is noted. Mild-to-moderate joint space narrowing of both hips appears stable. Mild subchondral sclerosis of the femoral heads are identified without flattening. Findings may be degenerative in etiology. Subtle changes of AVN are not entirely excluded. Soft tissue calcification adjacent to the greater trochanter of the right femur may reflect gluteal tendinosis. IMPRESSION: 1. No acute osseous abnormality of the bony pelvis and right hip. 2. Mild-to-moderate degenerative joint space narrowing of both hips. Subchondral sclerosis of the  femoral heads bilaterally without flattening may reflect osteoarthritis. Changes of AVN are not entirely excluded. 3. Soft tissue calcification adjacent to the greater trochanter of the right femur may reflect gluteal tendinosis. Electronically Signed   By: Ashley Royalty M.D.   On: 09/25/2018 22:40     Medications:  Scheduled: . [START ON 09/27/2018] allopurinol  100 mg Oral Q M,W,F  . amiodarone  200 mg Oral Daily  . atorvastatin  20 mg Oral QHS  . brimonidine  1 drop Left Eye BID   And  . timolol  1 drop Left Eye BID  . calcium acetate  667 mg Oral TID WC  . carbamazepine  100 mg Oral BID  . Chlorhexidine Gluconate Cloth  6 each Topical Q0600  . clotrimazole  10 mg Oral 5 X Daily  . darbepoetin (ARANESP) injection - DIALYSIS  200 mcg Intravenous Q Sat-HD  . docusate sodium  100 mg Oral BID  . feeding supplement (PRO-STAT SUGAR FREE 64)  30 mL Oral BID  . heparin  1,900 Units Dialysis Once in dialysis  . hydrocortisone sod succinate (SOLU-CORTEF) inj  50 mg Intravenous Q12H  . hydroxychloroquine  200 mg Oral Daily  . insulin aspart  0-5 Units Subcutaneous QHS  . insulin aspart  0-9 Units Subcutaneous TID WC  . latanoprost  1 drop Both Eyes QHS  . LORazepam  0.5 mg Oral Once  . [START ON 09/27/2018] metoprolol tartrate  12.5 mg Oral 2 times per day on Mon Wed Fri  . multivitamin  1 tablet Oral Daily  . pantoprazole  40 mg Oral Daily  . sodium chloride flush  3 mL Intravenous Q12H  . Travoprost (BAK Free)  1 drop Both Eyes QHS   Continuous: . sodium chloride    . sodium chloride    . sodium chloride    . cefTRIAXone (ROCEPHIN)  IV 1 g (09/25/18 2249)  . magnesium sulfate 1 - 4 g bolus IVPB    . vancomycin 750 mg (09/25/18 1631)   EHM:CNOBSJ chloride, sodium chloride, sodium chloride, acetaminophen **OR** acetaminophen, albuterol, alteplase, alum & mag hydroxide-simeth, dextromethorphan-guaiFENesin, heparin, hydrALAZINE, hydrOXYzine, labetalol, magnesium sulfate 1 - 4 g bolus  IVPB, metoprolol tartrate, ondansetron, oxyCODONE, phenol, potassium chloride, sodium chloride flush, zolpidem    Assessment/Plan:  Left foot wound infection Patient recently underwent transmetatarsal amputation.  Concern for infection as well as gangrene.  Vascular surgery is following.  Patient underwent above-knee amputation today.  Antibiotics include vancomycin and ceftriaxone and could be discontinued in 24 hours.  Cultures negative so far. No clear evidence for sepsis at this time.  CRP 13.7.  Lactic acid level was normal.  ESR 55.  Acute metabolic encephalopathy Most likely brought on by the above.  CT head did not show any acute findings.  Mental status seems to be at baseline.  Twitching of the right forearm Etiology unclear.  Could be myoclonus.  CT head done few days ago reveals no acute abnormalities.  A parafalcine meningioma was noted.  Discussed with neurology.  This is unlikely to cause her twitching.  We will check EEG.  Ativan orally x1.  End-stage renal disease on hemodialysis, TTS Nephrology is following.  Normocytic anemia Most likely anemia of chronic kidney disease.  Monitor hemoglobin closely.  No evidence of overt bleeding.  History of paroxysmal atrial fibrillation Patient's chads 2 vascular score 6.  Patient was on Eliquis at home.  Eliquis was switched over to heparin due to need for surgery.  Remained stable from a cardiovascular standpoint.  Continue amiodarone.  Transition back to Eliquis when okay with vascular surgery.    Right-sided abdominal pain This appears to be chronic based on history.  Ongoing for many months per patient and husband.  CT scan from October did not show any acute findings.  Lesion was noted in the right kidney concerning for RCC.  But no other explanation for her abdominal pain was noted.  May need further work-up but will wait for the acute issues to be treated first.  Patient's husband informed me that the patient has an appointment  with Dr. [name] in [place] in February.  She is tolerating her diet.  So patient off this can wait till follow-up appointment with her gastroenterologist.  History of SLE Noted to be on prednisone at home.  Patient was placed on stress dose steroids.  Start tapering down.  Continue Plaquenil.   Essential hypertension Continue home medications.  Monitor blood pressures closely.  History of COPD Respiratory status appears to be stable.    Chronic diastolic CHF Stable.  Volume being managed by hemodialysis.  Hyperlipidemia Statin  Diabetes mellitus type 2 with peripheral neuropathy and end-stage renal disease HbA1c 4.5 in December.  Will not be too aggressive.  Continue SSI.  GERD Protonix.  Gout Continue allopurinol  DVT Prophylaxis: On IV heparin    Code Status: Full code Family Communication: Discussed with the patient and her husband Disposition Plan: Management as outlined above.  PT and OT evaluation.    LOS: 2 days   [facility] Pager [phone] 09/26/2018, 1:11 PM  If 7PM-7AM, please contact night-coverage at [email or web address], password [facility]

## 2018-09-26 NOTE — Anesthesia Preprocedure Evaluation (Addendum)
Anesthesia Evaluation  Patient identified by MRN, date of birth, ID band Patient awake    Reviewed: Allergy & Precautions, NPO status , Patient's Chart, lab work & pertinent test results, reviewed documented beta blocker date and time   History of Anesthesia Complications Negative for: history of anesthetic complications  Airway Mallampati: II  TM Distance: >3 FB Neck ROM: Full    Dental  (+) Edentulous Upper, Edentulous Lower, Dental Advisory Given   Pulmonary asthma , pneumonia, COPD (last inhaler needed a few weeks ago),  COPD inhaler, former smoker,    breath sounds clear to auscultation       Cardiovascular hypertension, Pt. on medications and Pt. on home beta blockers (-) angina+ Peripheral Vascular Disease, +CHF and + DVT (2015)  + dysrhythmias Atrial Fibrillation  Rhythm:Irregular Rate:Normal  2/19 ECHO: EF 55-60%, mod AI, mod MR, mod TR   Neuro/Psych negative neurological ROS     GI/Hepatic Neg liver ROS, GERD  Medicated and Controlled,  Endo/Other  diabetes, Insulin Dependent  Renal/GU ESRF and DialysisRenal disease (K+ 3.0, TuThSa dialysis)     Musculoskeletal   Abdominal   Peds  Hematology  (+) Blood dyscrasia (Hb 8.3), anemia , eliquis   Anesthesia Other Findings   Reproductive/Obstetrics                            Anesthesia Physical  Anesthesia Plan  ASA: III  Anesthesia Plan: General   Post-op Pain Management:    Induction: Intravenous  PONV Risk Score and Plan: 4 or greater and Ondansetron, Dexamethasone and Treatment may vary due to age or medical condition  Airway Management Planned: LMA  Additional Equipment: None  Intra-op Plan:   Post-operative Plan: Extubation in OR  Informed Consent: I have reviewed the patients History and Physical, chart, labs and discussed the procedure including the risks, benefits and alternatives for the proposed anesthesia with  the patient or authorized representative who has indicated his/her understanding and acceptance.   Dental advisory given  Plan Discussed with: CRNA, Anesthesiologist and Surgeon  Anesthesia Plan Comments:        Anesthesia Quick Evaluation

## 2018-09-27 ENCOUNTER — Encounter (HOSPITAL_COMMUNITY): Payer: Self-pay | Admitting: Vascular Surgery

## 2018-09-27 ENCOUNTER — Inpatient Hospital Stay (HOSPITAL_COMMUNITY): Payer: Medicare Other

## 2018-09-27 DIAGNOSIS — R52 Pain, unspecified: Secondary | ICD-10-CM

## 2018-09-27 DIAGNOSIS — D649 Anemia, unspecified: Secondary | ICD-10-CM

## 2018-09-27 DIAGNOSIS — Z9981 Dependence on supplemental oxygen: Secondary | ICD-10-CM

## 2018-09-27 DIAGNOSIS — G8918 Other acute postprocedural pain: Secondary | ICD-10-CM

## 2018-09-27 DIAGNOSIS — I10 Essential (primary) hypertension: Secondary | ICD-10-CM

## 2018-09-27 DIAGNOSIS — I5032 Chronic diastolic (congestive) heart failure: Secondary | ICD-10-CM

## 2018-09-27 DIAGNOSIS — Z89611 Acquired absence of right leg above knee: Secondary | ICD-10-CM

## 2018-09-27 DIAGNOSIS — Z89612 Acquired absence of left leg above knee: Secondary | ICD-10-CM

## 2018-09-27 DIAGNOSIS — E876 Hypokalemia: Secondary | ICD-10-CM

## 2018-09-27 LAB — EXPECTORATED SPUTUM ASSESSMENT W GRAM STAIN, RFLX TO RESP C

## 2018-09-27 LAB — GLUCOSE, CAPILLARY
Glucose-Capillary: 141 mg/dL — ABNORMAL HIGH (ref 70–99)
Glucose-Capillary: 84 mg/dL (ref 70–99)
Glucose-Capillary: 115 mg/dL — ABNORMAL HIGH (ref 70–99)
Glucose-Capillary: 136 mg/dL — ABNORMAL HIGH (ref 70–99)

## 2018-09-27 LAB — CBC
HCT: 28.4 % — ABNORMAL LOW (ref 36.0–46.0)
Hemoglobin: 8.3 g/dL — ABNORMAL LOW (ref 12.0–15.0)
MCH: 27.1 pg (ref 26.0–34.0)
MCHC: 29.2 g/dL — ABNORMAL LOW (ref 30.0–36.0)
MCV: 92.8 fL (ref 80.0–100.0)
Platelets: 205 10*3/uL (ref 150–400)
RBC: 3.06 MIL/uL — AB (ref 3.87–5.11)
RDW: 17.4 % — ABNORMAL HIGH (ref 11.5–15.5)
WBC: 8.7 10*3/uL (ref 4.0–10.5)
nRBC: 0 % (ref 0.0–0.2)

## 2018-09-27 LAB — RENAL FUNCTION PANEL
ANION GAP: 12 (ref 5–15)
Albumin: 1.6 g/dL — ABNORMAL LOW (ref 3.5–5.0)
BUN: 39 mg/dL — ABNORMAL HIGH (ref 8–23)
CO2: 25 mmol/L (ref 22–32)
Calcium: 8 mg/dL — ABNORMAL LOW (ref 8.9–10.3)
Chloride: 99 mmol/L (ref 98–111)
Creatinine, Ser: 4.35 mg/dL — ABNORMAL HIGH (ref 0.44–1.00)
GFR calc non Af Amer: 10 mL/min — ABNORMAL LOW (ref >60)
GFR, EST AFRICAN AMERICAN: 11 mL/min — AB (ref >60)
GLUCOSE: 117 mg/dL — AB (ref 70–99)
Phosphorus: 3 mg/dL (ref 2.5–4.6)
Potassium: 3 mmol/L — ABNORMAL LOW (ref 3.5–5.1)
Sodium: 136 mmol/L (ref 135–145)

## 2018-09-27 MED ORDER — CHLORHEXIDINE GLUCONATE CLOTH 2 % EX PADS
6.0000 | MEDICATED_PAD | Freq: Every day | CUTANEOUS | Status: DC
  Administered 2018-09-27 – 2018-10-04 (×7): 6 via TOPICAL

## 2018-09-27 MED ORDER — APIXABAN 2.5 MG PO TABS
2.5000 mg | ORAL_TABLET | Freq: Two times a day (BID) | ORAL | Status: DC
  Administered 2018-09-27 – 2018-09-30 (×8): 2.5 mg via ORAL
  Filled 2018-09-27 (×9): qty 1

## 2018-09-27 MED ORDER — IPRATROPIUM-ALBUTEROL 0.5-2.5 (3) MG/3ML IN SOLN: 3.0000 mL | Freq: Three times a day (TID) | RESPIRATORY_TRACT | Status: DC

## 2018-09-27 MED ORDER — GUAIFENESIN ER 600 MG PO TB12
600.0000 mg | ORAL_TABLET | Freq: Two times a day (BID) | ORAL | Status: DC
  Administered 2018-09-27 – 2018-09-30 (×8): 600 mg via ORAL
  Filled 2018-09-27 (×9): qty 1

## 2018-09-27 MED ORDER — IPRATROPIUM-ALBUTEROL 0.5-2.5 (3) MG/3ML IN SOLN
3.0000 mL | Freq: Two times a day (BID) | RESPIRATORY_TRACT | Status: DC
  Administered 2018-09-27 – 2018-10-14 (×33): 3 mL via RESPIRATORY_TRACT
  Filled 2018-09-27 (×34): qty 3

## 2018-09-27 MED ORDER — PREDNISONE 10 MG PO TABS
10.0000 mg | ORAL_TABLET | Freq: Every day | ORAL | Status: DC
  Administered 2018-09-28 – 2018-10-01 (×4): 10 mg via ORAL
  Filled 2018-09-27 (×4): qty 1

## 2018-09-27 NOTE — Progress Notes (Signed)
Patient ID: [name], female   DOB: October 03, 1945, 72 y.o.   MRN: [id number] Answers questions this morning.  Reports mild incisional soreness.  Dressing dry and intact  Will take down dressing tomorrow to assure healing of AKA.  Discussed with Dr.Lin yesterday that the patient has a catheter for hemodialysis and nephrology is requesting evaluation for new long-term access.  In reviewing her records, it appears that she had a second stage basilic vein transposition on her left arm.  This has occluded.  Her surgery was in June 2019.  It does appear that she would be a candidate for a left upper arm AV graft.  We will determine optimal timing after she is resolved her current infection and amputation

## 2018-09-27 NOTE — Progress Notes (Signed)
Inpatient Rehabilitation Admissions Coordinator  I await PT and OT evals to assist with planning dispo.   Danne Baxter, RN, MSN Rehab Admissions Coordinator [phone] 09/27/2018 12:01 PM

## 2018-09-27 NOTE — Progress Notes (Addendum)
[facility] PROGRESS NOTE  [name] JOA:[id number] DOB: [birth year] DOA: 09/24/2018  PCP: Sharilyn Sites, MD  Brief History/Interval Summary: 72 y.o. female with medical history significant of hypertension, hyperlipidemia, diabetes mellitus, COPD, GERD, gout, and anemia, atrial fibrillation and DVT on Eliquis,  chronic diastolic CHF, ESRD-HD (TTS), lupus, PAD, s/p of R AKA, who presented with left foot pain.  She recently had a left transmetatarsal amputation by Dr. Doren Custard on 12/9.  Was found to have infection in that area follow-up in vascular surgery office.  She was sent over for hospitalization and further management.  Reason for Visit: Infection involving left foot wound.  Concern for gangrene  Consultants: Vascular surgery.  Nephrology  Procedures: Left above-knee amputation on 12/29  Antibiotics: Vancomycin and ceftriaxone  Subjective/Interval History: Patient denies any twitching of the right arm this morning.  Was noted to be somewhat short of breath by nursing staff.  Has been coughing.  Patient denies any chest pain.     ROS: No headaches  Objective:  Vital Signs  Vitals:   09/26/18 1226 09/26/18 1649 09/26/18 2105 09/27/18 0338  BP: (!) 163/54 (!) 158/49 (!) 162/50 (!) 159/49  Pulse: 74 68 66 70  Resp: 16 16    Temp: (!) 97.5 F (36.4 C) 98.7 F (37.1 C) 98.5 F (36.9 C) 97.7 F (36.5 C)  TempSrc: Oral Oral Oral Oral  SpO2: 100% 100% 100% 100%  Weight:      Height:        Intake/Output Summary (Last 24 hours) at 09/27/2018 1058 Last data filed at 09/27/2018 0400 Gross per 24 hour  Intake 240 ml  Output 2 ml  Net 238 ml   Filed Weights   09/25/18 1320 09/25/18 1746  Weight: 61.8 kg 59.2 kg   General appearance: Awake alert.  In no distress Resp: Coarse breath sounds bilaterally with a few crackles as well as rhonchi.  Normal effort. Cardio: S1-S2 is normal regular.  No S3-S4.  No rubs murmurs or bruit GI: Abdomen is soft.   Nontender nondistended.  Bowel sounds are present normal.  No masses organomegaly Extremities: Status post bilateral AKA's now.  Left stump covered in dressing. Neurologic: Alert.  No facial asymmetry.  Some asterixis is noted in the right upper extremity.  No twitching noted today.  Lab Results:  Data Reviewed: I have personally reviewed following labs and imaging studies  CBC: Recent Labs  Lab 09/24/18 1749 09/24/18 1821 09/25/18 0626 09/26/18 0552 09/27/18 0917  WBC 7.1  --  13.0* 9.9 8.7  NEUTROABS 5.9  --   --   --   --   HGB 9.5* 11.9* 8.2* 8.4* 8.3*  HCT 33.2* 35.0* 28.3* 28.6* 28.4*  MCV 94.9  --  93.4 93.8 92.8  PLT 192  --  169 171 601    Basic Metabolic Panel: Recent Labs  Lab 09/24/18 1749 09/24/18 1821 09/25/18 0626 09/25/18 1221 09/26/18 0552 09/27/18 0917  NA 133* 132* 133* 132* 136 136  K 3.6 3.8 3.7 4.2 3.2* 3.0*  CL 94* 95* 94* 95* 98 99  CO2 31  --  25 24 27 25   GLUCOSE 114* 107* 144* 162* 175* 117*  BUN 38* 48* 49* 54* 22 39*  CREATININE 4.22* 4.30* 4.91* 5.11* 2.93* 4.35*  CALCIUM 7.8*  --  7.6* 7.6* 7.6* 8.0*  PHOS  --   --   --  6.9* 3.8 3.0    GFR: Estimated Creatinine Clearance: 9.4 mL/min (A) (by C-G formula  based on SCr of 4.35 mg/dL (H)).  Liver Function Tests: Recent Labs  Lab 09/24/18 1749 09/25/18 1221 09/26/18 0552 09/27/18 0917  AST 31  --   --   --   ALT 9  --   --   --   ALKPHOS 72  --   --   --   BILITOT 0.6  --   --   --   PROT 6.3*  --   --   --   ALBUMIN 1.9* 1.6* 1.6* 1.6*    CBG: Recent Labs  Lab 09/26/18 0900 09/26/18 1151 09/26/18 1648 09/26/18 2103 09/27/18 0611  GLUCAP 139* 145* 198* 151* 141*     Recent Results (from the past 240 hour(s))  Blood Culture (routine x 2)     Status: None (Preliminary result)   Collection Time: 09/24/18  5:19 PM  Result Value Ref Range Status   Specimen Description BLOOD RIGHT ANTECUBITAL  Final   Special Requests   Final    BOTTLES DRAWN AEROBIC AND ANAEROBIC  Blood Culture adequate volume   Culture   Final    NO GROWTH 2 DAYS Performed at [facility], [place] [address] [place], [place] [address]    Report Status PENDING  Incomplete  Blood Culture (routine x 2)     Status: None (Preliminary result)   Collection Time: 09/24/18  5:24 PM  Result Value Ref Range Status   Specimen Description BLOOD BLOOD RIGHT HAND  Final   Special Requests   Final    BOTTLES DRAWN AEROBIC AND ANAEROBIC Blood Culture adequate volume   Culture   Final    NO GROWTH 2 DAYS Performed at [facility], [place] [address] [place], [place] [address]    Report Status PENDING  Incomplete  Expectorated sputum assessment w rflx to resp cult     Status: None   Collection Time: 09/26/18  9:30 PM  Result Value Ref Range Status   Specimen Description SPUTUM  Final   Special Requests NONE  Final   Sputum evaluation   Final    Sputum specimen not acceptable for testing.  Please recollect.   RESULT CALLED TO, READ BACK BY AND VERIFIED WITH: RN Eilleen Kempf [phone] FCP Performed at [facility], [place] [address], [place], [place] [address]    Report Status 09/27/2018 FINAL  Final      Radiology Studies: Dg Abd 1 View  Result Date: 09/25/2018 CLINICAL DATA:  Abdominal and right hip pain. EXAM: ABDOMEN - 1 VIEW COMPARISON:  07/28/2018 CT and 12/16/2017 abdomen radiograph FINDINGS: Scattered air containing small large bowel without bowel obstruction. Lumbar spondylosis with degenerative disc disease is identified greatest at L2-3. Mild joint space narrowing of both hips. No radiopaque calculi nor acute osseous abnormality. IMPRESSION: Nonspecific bowel gas pattern without bowel obstruction. Lumbar spondylosis. Electronically Signed   By: Ashley Royalty M.D.   On: 09/25/2018 22:37   Dg Chest Port 1 View  Result Date: 09/27/2018 CLINICAL DATA:  Chest congestion and cough began this morning. EXAM: PORTABLE CHEST 1 VIEW COMPARISON:  Portable chest x-ray of September 24, 2018 FINDINGS: The lungs are well-expanded. Slightly increased lung markings are noted greatest on the left. There is a small amount of thickening of the minor fissure. The cardiac silhouette is mildly enlarged though stable. The pulmonary vascularity is slightly more conspicuous. The dialysis catheter tip projects over the distal third of the SVC. There is calcification in the wall of the  tortuous thoracic aorta. IMPRESSION: Findings suggest low-grade CHF with mild asymmetric interstitial edema greatest on the left. Stable enlargement of the cardiac silhouette. No pleural effusion. Thoracic aortic atherosclerosis. Electronically Signed   By: David  Martinique M.D.   On: 09/27/2018 08:09   Dg Hip Unilat With Pelvis 1v Right  Result Date: 09/25/2018 CLINICAL DATA:  Right hip pain EXAM: DG HIP (WITH OR WITHOUT PELVIS) 1V RIGHT COMPARISON:  None. FINDINGS: L4-5 degenerative disc disease with lower lumbar facet arthropathy is seen. The bony pelvis appears intact without acute fracture or suspicious osseous lesions. No pelvic diastasis is noted. Mild-to-moderate joint space narrowing of both hips appears stable. Mild subchondral sclerosis of the femoral heads are identified without flattening. Findings may be degenerative in etiology. Subtle changes of AVN are not entirely excluded. Soft tissue calcification adjacent to the greater trochanter of the right femur may reflect gluteal tendinosis. IMPRESSION: 1. No acute osseous abnormality of the bony pelvis and right hip. 2. Mild-to-moderate degenerative joint space narrowing of both hips. Subchondral sclerosis of the femoral heads bilaterally without flattening may reflect osteoarthritis. Changes of AVN are not entirely excluded. 3. Soft tissue calcification adjacent to the greater trochanter of the right femur may reflect gluteal tendinosis. Electronically Signed   By: Ashley Royalty M.D.   On: 09/25/2018 22:40     Medications:  Scheduled: . allopurinol  100 mg  Oral Q M,W,F  . amiodarone  200 mg Oral Daily  . atorvastatin  20 mg Oral QHS  . brimonidine  1 drop Left Eye BID   And  . timolol  1 drop Left Eye BID  . calcium acetate  667 mg Oral TID WC  . carbamazepine  100 mg Oral BID  . Chlorhexidine Gluconate Cloth  6 each Topical Q0600  . clotrimazole  10 mg Oral 5 X Daily  . darbepoetin (ARANESP) injection - DIALYSIS  200 mcg Intravenous Q Sat-HD  . docusate sodium  100 mg Oral BID  . feeding supplement (PRO-STAT SUGAR FREE 64)  30 mL Oral BID  . guaiFENesin  600 mg Oral BID  . hydrocortisone sod succinate (SOLU-CORTEF) inj  25 mg Intravenous Q12H  . hydroxychloroquine  200 mg Oral Daily  . insulin aspart  0-5 Units Subcutaneous QHS  . insulin aspart  0-9 Units Subcutaneous TID WC  . latanoprost  1 drop Both Eyes QHS  . metoprolol tartrate  12.5 mg Oral 2 times per day on Mon Wed Fri  . multivitamin  1 tablet Oral Daily  . pantoprazole  40 mg Oral Daily  . sodium chloride flush  3 mL Intravenous Q12H  . Travoprost (BAK Free)  1 drop Both Eyes QHS   Continuous: . sodium chloride    . sodium chloride    . sodium chloride    . cefTRIAXone (ROCEPHIN)  IV 1 g (09/26/18 2106)  . magnesium sulfate 1 - 4 g bolus IVPB    . vancomycin 750 mg (09/25/18 1631)   VVO:HYWVPX chloride, sodium chloride, sodium chloride, acetaminophen **OR** acetaminophen, albuterol, hydrALAZINE, hydrOXYzine, labetalol, magnesium sulfate 1 - 4 g bolus IVPB, metoprolol tartrate, ondansetron, oxyCODONE, phenol, potassium chloride, sodium chloride flush    Assessment/Plan:  Left foot wound infection Patient recently underwent transmetatarsal amputation.  Presented with infection in the left foot.  Seen by vascular surgery.  Patient underwent left above-knee amputation on 12/29.  Cultures have been negative.  WBC is normal.  Patient was placed on vancomycin and ceftriaxone but can be discontinued now.  Discussed with vascular surgery.   Acute metabolic  encephalopathy Most likely brought on by the above.  CT head did not show any acute findings.  Mental status seems to be at baseline.  Twitching of the right forearm Etiology unclear.  Could be myoclonus.  CT head done few days ago reveals no acute abnormalities.  A parafalcine meningioma was noted.  Discussed with neurology.  This is unlikely to cause her twitching.  Patient was given 1 dose of Ativan.  No twitching noted this morning however asterixis is noted.  EEG is pending.  Will check ammonia level.  Cough Worse breath sounds noted.  Chest x-ray does not show any infiltrates but does suggest some pulmonary edema.  Flutter valve.  Mucinex.  Suctioning as needed.  Saturating okay on oxygen.  Aspiration precautions.  End-stage renal disease on hemodialysis, TTS Nephrology is following.  Normocytic anemia Most likely anemia of chronic kidney disease.  Monitor hemoglobin closely.  No evidence of overt bleeding.  History of paroxysmal atrial fibrillation Patient's chads 2 vascular score 6.  Patient was on Eliquis at home.  Eliquis was switched over to heparin due to need for surgery.  Continue amiodarone.  Resume Eliquis today.  Discussed with Dr. Donnetta Hutching.  Right-sided abdominal pain This appears to be chronic based on history.  Ongoing for many months per patient and husband.  CT scan from October did not show any acute findings.  Lesion was noted in the right kidney concerning for RCC.  But no other explanation for her abdominal pain was noted.  May need further work-up but will wait for the acute issues to be treated first.  Patient's husband informed me that the patient has an appointment with Dr. [name] in [place] in February.  Patient has been tolerating a diet.  Has not had any nausea or vomiting.  So further evaluation can wait till her appointment with her gastroenterologist.    History of SLE Noted to be on prednisone at home.  Patient was placed on stress dose steroids.  Will  discontinue after today.  Tinea Plaquenil.  Essential hypertension BloodPressure elevated most likely due to steroids.  she is on metoprolol.   Continue to monitor.  History of COPD See above.  Nebulizer treatments.  Chronic diastolic CHF Stable.  Volume being managed by hemodialysis.  Hyperlipidemia Statin  Diabetes mellitus type 2 with peripheral neuropathy and end-stage renal disease HbA1c 4.5 in December.  Will not be too aggressive.  Continue SSI.  GERD Protonix.  Gout Continue allopurinol  DVT Prophylaxis: Okay to resume apixaban per vascular Code Status: Full code Family Communication: Discussed with the patient and her husband Disposition Plan: Management as outlined above.  PT and OT evaluation.    LOS: 3 days   [facility] Pager [phone] 09/27/2018, 10:58 AM  If 7PM-7AM, please contact night-coverage at [email or web address], password [facility]

## 2018-09-27 NOTE — Consult Note (Addendum)
Physical Medicine and Rehabilitation Consult Reason for Consult:  Decreased functional mobility Referring Physician: [facility]   HPI: [name] is a 72 y.o.right handed female with history of atrial fibrillation, chronic diastolic congestive heart failure, COPD, end-stage renal disease with hemodialysis, SLE, right AKA 06/08/2018 and received inpatient rehabilitation services 06/10/2018 until 06/24/2018 and was discharged to home with home health therapies. Per chart review and patient, patient lives with spouse. 2 level home with ramped entrance. Primarily uses a wheelchair prior to admission. Presented 09/25/2018 with gangrenous changes of left foot as well as low-grade fever and altered mental status. CT the head showed stable small left para falcine meningioma no acute intracranial changes. Left lower extremity was not felt to be salvageable and underwent left AKA 09/26/2018 per Dr. Donnetta Hutching. Hospital course pain management. Acute on chronic anemia. Renal services follow-up for hemodialysis. Therapy evaluation pending. M.D. has requested physical medicine rehabilitation consult.   Review of Systems  HENT: Negative for hearing loss.   Eyes: Negative for blurred vision and double vision.  Respiratory: Positive for shortness of breath.   Cardiovascular: Positive for leg swelling.  Gastrointestinal: Positive for constipation. Negative for nausea and vomiting.  Genitourinary: Negative for dysuria, flank pain and hematuria.  Musculoskeletal: Positive for back pain, joint pain and myalgias.  Skin: Negative for rash.  Neurological: Positive for weakness.  All other systems reviewed and are negative.  Past Medical History:  Diagnosis Date  . Anemia   . Arthritis   . Asthma   . Atrial fibrillation ([place])   . Atrial flutter ([place])    a. s/p TEE/DCCV December 2013 b. 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 Bone spur of toe    Right 5th toe  . Chronic diastolic CHF (congestive heart failure) ([facility])    a. EF 50-55% by echo in 2015  . Colon polyposis   . COPD (chronic obstructive pulmonary disease) ([place])   . DVT of upper extremity (deep vein thrombosis) ([place])    Right basilic vein, June 8588  . ESRD (end stage renal disease) on dialysis ([place])    "TTF; [place]; off [employer]." (09/07/2018)  . Essential hypertension, benign   . Fractures   . Glaucoma   . Lupus ([place])   . Pericarditis   . SLE (systemic lupus erythematosus) ([place])   . Type 2 diabetes mellitus ([place])   . UTI (lower urinary tract infection)   . Wound of right leg 06/2017   Past Surgical History:  Procedure Laterality Date  . ABDOMINAL AORTOGRAM W/LOWER EXTREMITY N/A 12/18/2017   Procedure: ABDOMINAL AORTOGRAM W/LOWER EXTREMITY;  Surgeon: Elam Dutch, MD;  Location: [facility];  Service: Cardiovascular;  Laterality: N/A;  bilateral  . ABDOMINAL HYSTERECTOMY  1983  . AMPUTATION Right 06/08/2018   Procedure: RIGHT ABOVE KNEE AMPUTATION;  Surgeon: Elam Dutch, MD;  Location: [facility];  Service: Vascular;  Laterality: Right;  . ANKLE SURGERY  1993  . AV FISTULA PLACEMENT Left 03/27/2014   Procedure: ARTERIOVENOUS (AV) FISTULA CREATION;  Surgeon: Rosetta Posner, MD;  Location: [facility];  Service: Vascular;  Laterality: Left;  . BACK SURGERY  1980  . BASCILIC VEIN TRANSPOSITION Left 01/13/2018   Procedure: LEFT BASILIC VEIN TRANSPOSITION SECOND STAGE;  Surgeon: Elam Dutch, MD;  Location: [facility];  Service: Vascular;  Laterality: Left;  . CARDIOVASCULAR STRESS TEST  12/19/2009   no stress induced rhythm abnormalities, ekg negative for ischemia  . CARDIOVERSION  09/16/2012   Procedure: CARDIOVERSION;  Surgeon: Sanda Klein, MD;  Location: [facility];  Service: Cardiovascular;  Laterality: N/A;  . CATARACT EXTRACTION    .  COLONOSCOPY  09/20/2012   Dr. [name]: multiple tubular adenomas   . COLONOSCOPY  2005   Dr. [name]. Polyps, path unknown   . COLONOSCOPY N/A 07/24/2016   Dr. [name]: 3 significant size polyps removed from the colon, tubular adenomas.  Next colonoscopy in October 2020.  Marland Kitchen ESOPHAGOGASTRODUODENOSCOPY  09/20/2012   Dr. [name]: duodenal erosion and possible resolving ulcer at the angularis   . ESOPHAGOGASTRODUODENOSCOPY N/A 03/24/2018   Dr. [name]: Mild erosive reflux esophagitis, erosive gastropathy and enteropathy fairly extensive, no H. pylori on biopsy.  Marland Kitchen EYE SURGERY    . IR FLUORO GUIDE CV LINE RIGHT  11/09/2017  . IR RADIOLOGIST EVAL & MGMT  09/01/2018  . IR US GUIDE VASC ACCESS RIGHT  11/09/2017  . REVISON OF ARTERIOVENOUS FISTULA Left 03/15/2018   Procedure: REVISION OF Left arm BASILIC VEIN TRANSPOSITION;  Surgeon: Angelia Mould, MD;  Location: [facility];  Service: Vascular;  Laterality: Left;  . TEE WITHOUT CARDIOVERSION  09/16/2012   Procedure: TRANSESOPHAGEAL ECHOCARDIOGRAM (TEE);  Surgeon: Sanda Klein, MD;  Location: [facility];  Service: Cardiovascular;  Laterality: N/A;  . TRANSESOPHAGEAL ECHOCARDIOGRAM WITH CARDIOVERSION  09/16/2012   EF 60-65%, moderate LVH, moderate regurg of the aortic valve, LA moderately dilated  . TRANSMETATARSAL AMPUTATION Left 09/06/2018   Procedure: TRANSMETATARSAL AMPUTATION;  Surgeon: Angelia Mould, MD;  [facility]: [facility];  Service: Vascular;  Laterality: Left;   Family History  Problem Relation Age of Onset  . Diabetes Brother   . Diabetes Father   . Hypertension Father   . Hypertension Sister   . Stroke Mother   . Diabetes Sister   . Hypertension Brother   . Colon cancer Neg Hx    Social History:  reports that she quit smoking about 6 years ago. Her smoking use included cigarettes. She has a 30.00 pack-year smoking history. She has never used smokeless tobacco. She reports that she does not drink alcohol or use drugs. Allergies:    Allergies  Allergen Reactions  . Ace Inhibitors Cough   Medications Prior to Admission  Medication Sig Dispense Refill  . acetaminophen (TYLENOL) 325 MG tablet Take 1-2 tablets (325-650 mg total) by mouth every 4 (four) hours as needed for mild pain. (Patient taking differently: Take 325-650 mg by mouth every 4 (four) hours as needed (for mild to moderate pain). )    . albuterol (PROVENTIL HFA;VENTOLIN HFA) 108 (90 BASE) MCG/ACT inhaler Inhale 2 puffs into the lungs every 6 (six) hours as needed for wheezing or shortness of breath.     . allopurinol (ZYLOPRIM) 100 MG tablet Take 100 mg by mouth every Monday, Wednesday, and Friday.     . Amino Acids-Protein Hydrolys (FEEDING SUPPLEMENT, PRO-STAT SUGAR FREE 64,) LIQD Take 30 mLs by mouth 2 (two) times daily. 900 mL 0  . amiodarone (PACERONE) 200 MG tablet Take 1 tablet (200 mg total) by mouth daily. 90 tablet 3  . apixaban (ELIQUIS) 5 MG TABS tablet Take 1 tablet (5 mg total) by mouth 2 (two) times daily. 60 tablet 6  . atorvastatin (LIPITOR) 20 MG tablet Take 20 mg by mouth at bedtime.     . brimonidine-timolol (COMBIGAN) 0.2-0.5 % ophthalmic solution  Place 1 drop into the left eye 2 (two) times daily.     . carbamazepine (TEGRETOL XR) 100 MG 12 hr tablet Take 100 mg by mouth 2 (two) times daily.    Marland Kitchen docusate sodium (COLACE) 100 MG capsule Take 100 mg by mouth 2 (two) times daily.    . fentaNYL (DURAGESIC - DOSED MCG/HR) 25 MCG/HR patch Place 25 mcg onto the skin every 3 (three) days.    . folic acid (FOLVITE) 295 MCG tablet Take 800 mcg by mouth daily.    . insulin aspart (NOVOLOG FLEXPEN) 100 UNIT/ML FlexPen Inject 5 Units into the skin 3 (three) times daily with meals as needed (if BGL is over 100).    Marland Kitchen latanoprost (XALATAN) 0.005 % ophthalmic solution Place 1 drop into both eyes at bedtime.    . metoprolol tartrate (LOPRESSOR) 25 MG tablet Take 1/2 tablet twice a day on Mondays, Wednesdays and Fridays (Patient taking differently: Take 12.5  mg by mouth See admin instructions. Take 12.5 mg by mouth two times a day on Mon/Wed/Fri) 30 tablet 0  . multivitamin (RENA-VIT) TABS tablet Take 1 tablet by mouth daily.    . Oxycodone HCl 10 MG TABS Take 10 mg by mouth every 6 (six) hours as needed (for moderate to severe pain).     . pantoprazole (PROTONIX) 40 MG tablet Take 1 tablet (40 mg total) by mouth daily. 30 tablet 11  . predniSONE (DELTASONE) 5 MG tablet Take 5 mg by mouth daily with breakfast.    . calcium acetate (PHOSLO) 667 MG capsule Take 667 mg by mouth 3 (three) times daily with meals.   4  . Control Gel Formula Dressing (DUODERM CGF BORDER DRESSING EX) Place 1 patch onto the skin daily. Applied to left hip    . hydroxychloroquine (PLAQUENIL) 200 MG tablet Take 200 mg by mouth daily.     . travoprost, benzalkonium, (TRAVATAN) 0.004 % ophthalmic solution Place 1 drop into both eyes at bedtime.     Marland Kitchen UNIFINE PENTIPS 31G X 6 MM MISC USE AS DIRECTED AT BEDTIME WITH LEVEMIR AND WITH SLIDING SCALE INSULIN. (Patient taking differently: as directed. ) 100 each 0    Home: Home Living Family/patient expects to be discharged to:: Private residence Living Arrangements: Spouse/significant other  Functional History:   Functional Status:  Mobility:          ADL:    Cognition: Cognition Orientation Level: Oriented to person, Oriented to situation, Disoriented to place, Disoriented to time    Blood pressure (!) 159/49, pulse 70, temperature 97.7 F (36.5 C), temperature source Oral, resp. rate 16, height 5' (1.524 m), weight 59.2 kg, SpO2 100 %. Physical Exam  Vitals reviewed. Constitutional: She appears well-developed and well-nourished.  HENT:  Head: Normocephalic and atraumatic.  Eyes: Right eye exhibits no discharge. Left eye exhibits no discharge.  Pupils reactive to light Dysconjugate gaze  Neck: Normal range of motion. Neck supple. No thyromegaly present.  Cardiovascular: Normal rate and regular rhythm.    Respiratory:  +Mora Increased WOB [place]  GI: Soft. Bowel sounds are normal. She exhibits no distension.  Musculoskeletal:     Comments: Left stump edema and tenderness  Neurological: She is alert.  Lethargic but arousable.  She does provide her name and age.  She would easily fall back asleep during exam. Motor: B/l UE 4/5 proximal to distal B/lLE: HF 2-/5  Skin:  Right AKA is well-healed.  Left AKA dressed c/d/i  Psychiatric: Her affect is blunt.  Her speech is delayed. She is slowed. Cognition and memory are impaired.    Results for orders placed or performed during the hospital encounter of 09/24/18 (from the past 24 hour(s))  Glucose, capillary     Status: Abnormal   Collection Time: 09/26/18  5:58 AM  Result Value Ref Range   Glucose-Capillary 157 (H) 70 - 99 mg/dL  Urinalysis, Routine w reflex microscopic     Status: Abnormal   Collection Time: 09/26/18  6:30 AM  Result Value Ref Range   Color, Urine AMBER (A) YELLOW   APPearance TURBID (A) CLEAR   Specific Gravity, Urine 1.018 1.005 - 1.030   pH 5.0 5.0 - 8.0   Glucose, UA NEGATIVE NEGATIVE mg/dL   Hgb urine dipstick LARGE (A) NEGATIVE   Bilirubin Urine NEGATIVE NEGATIVE   Ketones, ur NEGATIVE NEGATIVE mg/dL   Protein, ur 100 (A) NEGATIVE mg/dL   Nitrite NEGATIVE NEGATIVE   Leukocytes, UA LARGE (A) NEGATIVE   RBC / HPF >50 (H) 0 - 5 RBC/hpf   WBC, UA >50 (H) 0 - 5 WBC/hpf   Bacteria, UA MANY (A) NONE SEEN   Squamous Epithelial / LPF 21-50 0 - 5   Budding Yeast PRESENT   Glucose, capillary     Status: Abnormal   Collection Time: 09/26/18  9:00 AM  Result Value Ref Range   Glucose-Capillary 139 (H) 70 - 99 mg/dL  Glucose, capillary     Status: Abnormal   Collection Time: 09/26/18 11:51 AM  Result Value Ref Range   Glucose-Capillary 145 (H) 70 - 99 mg/dL  Glucose, capillary     Status: Abnormal   Collection Time: 09/26/18  4:48 PM  Result Value Ref Range   Glucose-Capillary 198 (H) 70 - 99 mg/dL  Glucose,  capillary     Status: Abnormal   Collection Time: 09/26/18  9:03 PM  Result Value Ref Range   Glucose-Capillary 151 (H) 70 - 99 mg/dL   Comment 1 Document in Chart    Dg Abd 1 View  Result Date: 09/25/2018 CLINICAL DATA:  Abdominal and right hip pain. EXAM: ABDOMEN - 1 VIEW COMPARISON:  07/28/2018 CT and 12/16/2017 abdomen radiograph FINDINGS: Scattered air containing small large bowel without bowel obstruction. Lumbar spondylosis with degenerative disc disease is identified greatest at L2-3. Mild joint space narrowing of both hips. No radiopaque calculi nor acute osseous abnormality. IMPRESSION: Nonspecific bowel gas pattern without bowel obstruction. Lumbar spondylosis. Electronically Signed   By: Ashley Royalty M.D.   On: 09/25/2018 22:37   Dg Hip Unilat With Pelvis 1v Right  Result Date: 09/25/2018 CLINICAL DATA:  Right hip pain EXAM: DG HIP (WITH OR WITHOUT PELVIS) 1V RIGHT COMPARISON:  None. FINDINGS: L4-5 degenerative disc disease with lower lumbar facet arthropathy is seen. The bony pelvis appears intact without acute fracture or suspicious osseous lesions. No pelvic diastasis is noted. Mild-to-moderate joint space narrowing of both hips appears stable. Mild subchondral sclerosis of the femoral heads are identified without flattening. Findings may be degenerative in etiology. Subtle changes of AVN are not entirely excluded. Soft tissue calcification adjacent to the greater trochanter of the right femur may reflect gluteal tendinosis. IMPRESSION: 1. No acute osseous abnormality of the bony pelvis and right hip. 2. Mild-to-moderate degenerative joint space narrowing of both hips. Subchondral sclerosis of the femoral heads bilaterally without flattening may reflect osteoarthritis. Changes of AVN are not entirely excluded. 3. Soft tissue calcification adjacent to the greater trochanter of the right femur may reflect gluteal tendinosis.  Electronically Signed   By: Ashley Royalty M.D.   On: 09/25/2018  22:40    Assessment/Plan: Diagnosis: Left AKA with history of right AKA Labs independently reviewed.  Records reviewed and summated above. Clean amputation daily with soap and water Monitor incision site for signs of infection or impending skin breakdown. Staples to remain in place for 3-4 weeks Stump shrinker, for edema control  Scar mobilization massaging to prevent soft tissue adherence Stump protector during therapies Prevent flexion contractures by implementing the following:   Encourage prone lying for 20-30 mins per day BID to avoid hip flexion  Contractures if medically appropriate;  Avoid prolonged sitting Post surgical pain control with oral medication Phantom limb pain control with physical modalities including desensitization techniques (gentle self massage to the residual stump,hot packs if sensation intact, [place]) and mirror therapy, TENS. If ineffective, consider pharmacological treatment for neuropathic pain (e.g gabapentin, pregabalin, amytriptalyine, duloxetine).   1. Does the need for close, 24 hr/day medical supervision in concert with the patient's rehab needs make it unreasonable for this patient to be served in a less intensive setting? Potentially  2. Co-Morbidities requiring supervision/potential complications: Acute on chronic anemia (repeat labs, transfuse to ensure appropriate perfusion for increased activity tolerance), post-op pain management (Biofeedback training with therapies to help reduce reliance on opiate pain medications, monitor pain control during therapies, and sedation at rest and titrate to maximum efficacy to ensure participation and gains in therapies), atrial fibrillation (monitor with increase mobility), chronic diastolic congestive heart failure (monitor for signs/ symptoms of fluid overload), COPD (wean supplemental O2, monitor sats and RR with increased exertion), end-stage renal disease (recs per Nephro), SLE (cont meds), HTN (monitor and provide  prns in accordance with increased physical exertion and pain), hypokalemia (continue to monitor and replete as necessary) 3. Due to safety, skin/wound care, disease management, medication administration, pain management and patient education, does the patient require 24 hr/day rehab nursing? Yes 4. Does the patient require coordinated care of a physician, rehab nurse, PT (1-2 hrs/day, 5 days/week) and OT (1-2 hrs/day, 5 days/week) to address physical and functional deficits in the context of the above medical diagnosis(es)? Yes Addressing deficits in the following areas: balance, endurance, locomotion, strength, transferring, bathing, dressing, toileting and psychosocial support 5. Can the patient actively participate in an intensive therapy program of at least 3 hrs of therapy per day at least 5 days per week? Potentially 6. The potential for patient to make measurable gains while on inpatient rehab is TBD 7. Anticipated functional outcomes upon discharge from inpatient rehab are TBD  with PT, TBD with OT, n/a with SLP. 8. Estimated rehab length of stay to reach the above functional goals is: TBD. 9. Anticipated D/C setting: TBD 10. Anticipated post D/C treatments: HH therapy and Home excercise program 11. Overall Rehab/Functional Prognosis: good  RECOMMENDATIONS: This patient's condition is appropriate for continued rehabilitative care in the following setting: Will await completion of therapy evaluations, however, given prior level of functioning and current endurance/energy requirement, anticipate recommendations will be for [facility] if near baseline level of functionging vs SNF if unable to tolerate therapies and limited caregiver. Will also await final plans AV graft. Patient has agreed to participate in recommended program. Potentially Note that insurance prior authorization may be required for reimbursement for recommended care.  Comment: Rehab Admissions Coordinator to follow up.   I have  personally performed a face to face diagnostic evaluation, including, but not limited to relevant history and physical exam findings, of this  patient and developed relevant assessment and plan.  Additionally, I have reviewed and concur with the physician assistant's documentation above.   Delice Lesch, MD, ABPMR Lavon Paganini Angiulli, PA-C 09/27/2018

## 2018-09-27 NOTE — Progress Notes (Signed)
Inpatient Rehabilitation Admissions Coordinator  I met with patient and her spouse at bedside. Pt previously at CIR after first AKA. After her TMA recently, insurance denied approval to admit to inpt rehab. Spouse prefers inpt rehab rather than SNF. I discussed the need for her to be able to tolerate the intensity of therapy and that I would follow up after therapy evals. If patient needs SNF, they do not want to return to [facility]. I will follow up tomorrow.  Danne Baxter, RN, MSN Rehab Admissions Coordinator [phone] 09/27/2018 3:27 PM

## 2018-09-27 NOTE — Progress Notes (Signed)
EEG complete - results pending 

## 2018-09-27 NOTE — Progress Notes (Signed)
Lab called and stated sputum specimen is unacceptable. Did notify assigned nurse.

## 2018-09-27 NOTE — Plan of Care (Signed)
  Problem: Pain Managment: Goal: General experience of comfort will improve Outcome: Progressing   Problem: Safety: Goal: Ability to remain free from injury will improve Outcome: Progressing   Problem: Skin Integrity: Goal: Risk for impaired skin integrity will decrease Outcome: Progressing   

## 2018-09-27 NOTE — Discharge Instructions (Addendum)
Information on my medicine - ELIQUIS (apixaban)  Why was Eliquis prescribed for you? Eliquis was prescribed for you to reduce the risk of a blood clot forming that can cause a stroke if you have a medical condition called atrial fibrillation (a type of irregular heartbeat).  What do You need to know about Eliquis ? Take your Eliquis TWICE DAILY - one tablet in the morning and one tablet in the evening with or without food. If you have difficulty swallowing the tablet whole please discuss with your pharmacist how to take the medication safely.  You dose was reduced to 2.5 mg by mouth twice daily on this admit. You can continue taking the supply you have at home and cut the 5 mg tablets in half (2.5 mg total for each dose) until you run out.  Take Eliquis exactly as prescribed by your doctor and DO NOT stop taking Eliquis without talking to the doctor who prescribed the medication.  Stopping may increase your risk of developing a stroke.  Refill your prescription before you run out.  After discharge, you should have regular check-up appointments with your healthcare provider that is prescribing your Eliquis.  In the future your dose may need to be changed if your kidney function or weight changes by a significant amount or as you get older.  What do you do if you miss a dose? If you miss a dose, take it as soon as you remember on the same day and resume taking twice daily.  Do not take more than one dose of ELIQUIS at the same time to make up a missed dose.  Important Safety Information A possible side effect of Eliquis is bleeding. You should call your healthcare provider right away if you experience any of the following: ? Bleeding from an injury or your nose that does not stop. ? Unusual colored urine (red or dark brown) or unusual colored stools (red or black). ? Unusual bruising for unknown reasons. ? A serious fall or if you hit your head (even if there is no bleeding).  Some  medicines may interact with Eliquis and might increase your risk of bleeding or clotting while on Eliquis. To help avoid this, consult your healthcare provider or pharmacist prior to using any new prescription or non-prescription medications, including herbals, vitamins, non-steroidal anti-inflammatory drugs (NSAIDs) and supplements.  This website has more information on Eliquis (apixaban): http://www.eliquis.com/eliquis/home

## 2018-09-27 NOTE — Care Management Important Message (Signed)
Important Message  Patient Details  Name: [name] MRN: [id number] Date of Birth: March 11, 1946   Medicare Important Message Given:  Yes    Orbie Pyo 09/27/2018, 4:26 PM

## 2018-09-27 NOTE — Evaluation (Signed)
Occupational Therapy Evaluation Patient Details Name: [name] MRN: [id number] DOB: 11-Mar-1946 Today's Date: 09/27/2018    History of Present Illness [name] is a 72 y.o. female with medical history significant of hypertension, hyperlipidemia, diabetes mellitus, COPD, GERD, gout, and anemia, atrial fibrillation and DVT on Eliquis, ESRD-HD (TTS), lupus, PAD, R AKA (05/2018), now s/p L AKA   Clinical Impression   PTA Pt at [facility]) getting rehab, Prior to that Pt was performing transfers with assist, niece was coming over daily to bathe and dress her, used adult diapers and performed BSC transfers - largely WC level. Pt is currently mod A +2 for bed mobility (rolling) Pt was total A for peri care after she was found incontinent of stool. PT with decreased cognition, decreased activity tolerance, deconditioning. Pt will require skilled OT in the acute setting and afterwards at the SNF level. Next session plan on OOB to recliner - a drop arm would be the most ideal.   Of Note: At this time recommending an air mattress for sacral wounds, and also a Palliative consult to assist Pt and husband with multiple chronic diseases and management.     Follow Up Recommendations  SNF;Supervision/Assistance - 24 hour    Equipment Recommendations  Other (comment)(defer to next venue of care)    Recommendations for Other Services Other (comment)(Palliative Consult)     Precautions / Restrictions Precautions Precautions: Fall Restrictions Weight Bearing Restrictions: Yes LLE Weight Bearing: Non weight bearing Other Position/Activity Restrictions: unsure of why PWB through LUE - no orders in chart      Mobility Bed Mobility Overal bed mobility: Needs Assistance Bed Mobility: Rolling Rolling: Mod assist;+2 for physical assistance;+2 for safety/equipment         General bed mobility comments: use of bed rail to assist, verbal cues for sequencing, assist using the bed  pads for hips  Transfers                 General transfer comment: NT this session    Balance                                           ADL either performed or assessed with clinical judgement   ADL Overall ADL's : Needs assistance/impaired Eating/Feeding: Supervision/ safety;Set up;Sitting   Grooming: Moderate assistance;Bed level   Upper Body Bathing: Moderate assistance;Bed level   Lower Body Bathing: Total assistance;Bed level   Upper Body Dressing : Moderate assistance;Bed level   Lower Body Dressing: Maximal assistance   Toilet Transfer: Moderate assistance Toilet Transfer Details (indicate cue type and reason): rolling in bed for bed pan Toileting- Clothing Manipulation and Hygiene: Total assistance;+2 for physical assistance;+2 for safety/equipment;Bed level Toileting - Clothing Manipulation Details (indicate cue type and reason): rolling for peri care       General ADL Comments: Pt unaware that she was incontinent of stool, Pt became extremely fatigued from just rolling in the bed for peri care and cleaning     Vision         Perception     Praxis      Pertinent Vitals/Pain Pain Assessment: Faces Faces Pain Scale: Hurts even more Pain Location: L residual limb and hip Pain Descriptors / Indicators: Discomfort;Grimacing;Moaning;Sore Pain Intervention(s): Limited activity within patient's tolerance;Monitored during session;Repositioned     Hand Dominance Right   Extremity/Trunk Assessment Upper Extremity Assessment  Upper Extremity Assessment: Generalized weakness   Lower Extremity Assessment Lower Extremity Assessment: RLE deficits/detail;LLE deficits/detail RLE Deficits / Details: R AKA LLE Deficits / Details: L AKA (new)       Communication Communication Communication: No difficulties;Other (comment)(slighlt mumbled today)   Cognition Arousal/Alertness: Lethargic Behavior During Therapy: Flat affect Overall  Cognitive Status: Impaired/Different from baseline Area of Impairment: Attention;Following commands;Awareness                   Current Attention Level: Sustained   Following Commands: Follows one step commands with increased time   Awareness: Emergent       General Comments  Husband in room throughout session    Exercises     Shoulder [place] expects to be discharged to:: Skilled nursing facility Living Arrangements: Spouse/significant other Available Help at Discharge: Family;Available 24 hours/day Type of Home: House Home Access: Ramped entrance     Home Layout: Two level Alternate Level Stairs-Number of Steps: 6- stair lift Alternate Level Stairs-Rails: Left Bathroom Shower/Tub: Occupational psychologist: Standard Bathroom Accessibility: No   Home Equipment: Cane - single point;Walker - 2 wheels;Wheelchair - manual;Shower seat;Bedside commode   Additional Comments: Pt was at [facility] for rehab PTA, before that she was at home and her neice came over daily to bathe and dress her      Prior Functioning/Environment Level of Independence: Needs assistance  Gait / Transfers Assistance Needed: non ambulatory, assisted transfers to wheelchair, [facility] using sliding board with at least 1 person assist, sometimes 2  ADL's / Homemaking Assistance Needed: assisted by family, patient use depends for urination, assisted to [facility] for bowel movements using sliding board. Niece comes to assist with sponge bathing.   Comments: Reports vision as "fair", able to watch tv. Enjoys playing Monopoly with husband        OT Problem List: Decreased strength;Decreased range of motion;Decreased activity tolerance;Impaired balance (sitting and/or standing);Decreased knowledge of use of DME or AE;Decreased knowledge of precautions;Pain      OT Treatment/Interventions: Self-care/ADL training;Therapeutic exercise;Energy conservation;DME and/or  AE instruction;Therapeutic activities;Patient/family education;Balance training    OT Goals(Current goals can be found in the care plan section) Acute Rehab OT Goals Patient Stated Goal: get stronger OT Goal Formulation: With patient Time For Goal Achievement: 10/11/18 Potential to Achieve Goals: Fair ADL Goals Pt Will Perform Grooming: with modified independence;sitting Pt Will Transfer to Toilet: with max assist;anterior/posterior transfer;with transfer board;bedside commode Pt Will Perform Toileting - Clothing Manipulation and hygiene: with min guard assist;sitting/lateral leans  OT Frequency: Min 1X/week   Barriers to D/C:            Co-evaluation PT/OT/SLP Co-Evaluation/Treatment: Yes Reason for Co-Treatment: Necessary to address cognition/behavior during functional activity;For patient/therapist safety;To address functional/ADL transfers;Complexity of the patient's impairments (multi-system involvement) PT goals addressed during session: Mobility/safety with mobility;Strengthening/ROM OT goals addressed during session: ADL's and self-care      AM-PAC OT "6 Clicks" Daily Activity     Outcome Measure Help from another person eating meals?: A Little Help from another person taking care of personal grooming?: A Little Help from another person toileting, which includes using toliet, bedpan, or urinal?: Total Help from another person bathing (including washing, rinsing, drying)?: A Lot Help from another person to put on and taking off regular upper body clothing?: A Lot Help from another person to put on and taking off regular lower body clothing?: Total 6 Click Score: 12  End of Session Nurse Communication: Mobility status;Other (comment)(air mattress and palliative consult)  Activity Tolerance: Patient limited by fatigue;Patient limited by lethargy Patient left: in bed;with call bell/phone within reach;with bed alarm set;with nursing/sitter in room  OT Visit Diagnosis:  Other abnormalities of gait and mobility (R26.89);Muscle weakness (generalized) (M62.81);Other symptoms and signs involving cognitive function;Adult, failure to thrive (R62.7);Pain Pain - Right/Left: Left Pain - part of body: Hip;Leg                Time: 7998-0012 OT Time Calculation (min): 36 min Charges:  OT General Charges $OT Visit: 1 Visit OT Evaluation $OT Eval Moderate Complexity: [place] OTR/L Acute Rehabilitation Services Pager: [phone] Office: [place] 09/27/2018, 4:34 PM

## 2018-09-27 NOTE — Plan of Care (Signed)

## 2018-09-27 NOTE — Evaluation (Signed)
Physical Therapy Evaluation Patient Details Name: [name] MRN: [id number] DOB: 05-May-1946 Today's Date: 09/27/2018   History of Present Illness  [name] is a 72 y.o. female with medical history significant of hypertension, hyperlipidemia, diabetes mellitus, COPD, GERD, gout, and anemia, atrial fibrillation and DVT on Eliquis, ESRD-HD (TTS), lupus, PAD, R AKA (05/2018), now s/p L AKA  Clinical Impression  PTA pt was in rehab at [name] making slow progress towards her goals. Pt very lethargic with increased pain with movement. Pt required modAx2 for rolling to perform pericare, pt request not getting out of bed after she was cleaned up. PT will work towards OOB mobility in next session. Pt would benefit from drop arm recliner to facilitate safe transfers. PT recommending SNF level rehab at discharge to work on transfers. PT will continue to follow acutely.    Follow Up Recommendations SNF    Equipment Recommendations  Other (comment)(TBD at next venue)    Recommendations for Other Services       Precautions / Restrictions Precautions Precautions: Fall Restrictions Weight Bearing Restrictions: Yes LLE Weight Bearing: Non weight bearing Other Position/Activity Restrictions: unsure of why PWB through LUE - no orders in chart      Mobility  Bed Mobility Overal bed mobility: Needs Assistance Bed Mobility: Rolling Rolling: Mod assist;+2 for physical assistance;+2 for safety/equipment         General bed mobility comments: use of bed rail to assist, verbal cues for sequencing, assist using the bed pads for hips  Transfers                 General transfer comment: NT this session            Pertinent Vitals/Pain Pain Assessment: Faces Faces Pain Scale: Hurts even more Pain Location: L residual limb and hip Pain Descriptors / Indicators: Discomfort;Grimacing;Moaning;Sore Pain Intervention(s): Limited activity within patient's  tolerance;Monitored during session;Repositioned    Home Living Family/patient expects to be discharged to:: Skilled nursing facility Living Arrangements: Spouse/significant other Available Help at Discharge: Family;Available 24 hours/day Type of Home: House Home Access: Ramped entrance     Home Layout: Two level Home Equipment: Cane - single point;Walker - 2 wheels;Wheelchair - manual;Shower seat;Bedside commode Additional Comments: Pt was at [name] for rehab PTA, before that she was at home and her neice came over daily to bathe and dress her    Prior Function Level of Independence: Needs assistance   Gait / Transfers Assistance Needed: non ambulatory, assisted transfers to wheelchair, [name] using sliding board with at least 1 person assist, sometimes 2   ADL's / Homemaking Assistance Needed: assisted by family, patient use depends for urination, assisted to [name] for bowel movements using sliding board. Niece comes to assist with sponge bathing.  Comments: Reports vision as "fair", able to watch tv. Enjoys playing Monopoly with husband     Hand Dominance   Dominant Hand: Right    Extremity/Trunk Assessment   Upper Extremity Assessment Upper Extremity Assessment: Generalized weakness    Lower Extremity Assessment Lower Extremity Assessment: RLE deficits/detail;LLE deficits/detail RLE Deficits / Details: R AKA LLE Deficits / Details: L AKA (new)       Communication   Communication: No difficulties;Other (comment)(slighlt mumbled today)  Cognition Arousal/Alertness: Lethargic Behavior During Therapy: Flat affect Overall Cognitive Status: Impaired/Different from baseline Area of Impairment: Attention;Following commands;Awareness                   Current Attention Level: Sustained  Following Commands: Follows one step commands with increased time   Awareness: Emergent          General Comments General comments (skin integrity, edema, etc.): Husband in  room throughout session     Assessment/Plan    PT Assessment Patient needs continued PT services  PT Problem List Decreased strength;Decreased activity tolerance;Decreased balance;Decreased mobility;Pain       PT Treatment Interventions DME instruction;Functional mobility training;Therapeutic activities;Therapeutic exercise;Balance training;Wheelchair mobility training;Patient/family education    PT Goals (Current goals can be found in the Care Plan section)  Acute Rehab PT Goals Patient Stated Goal: get stronger PT Goal Formulation: With patient/family Time For Goal Achievement: 10/11/18 Potential to Achieve Goals: Fair    Frequency Min 3X/week   Barriers to discharge        Co-evaluation   Reason for Co-Treatment: Complexity of the patient's impairments (multi-system involvement) PT goals addressed during session: Mobility/safety with mobility OT goals addressed during session: ADL's and self-care       AM-PAC PT "6 Clicks" Mobility  Outcome Measure Help needed turning from your back to your side while in a flat bed without using bedrails?: A Lot Help needed moving from lying on your back to sitting on the side of a flat bed without using bedrails?: Total Help needed moving to and from a bed to a chair (including a wheelchair)?: Total Help needed standing up from a chair using your arms (e.g., wheelchair or bedside chair)?: Total Help needed to walk in Little room?: Total Help needed climbing 3-5 steps with a railing? : Total 6 Click Score: 7    End of Session Equipment Utilized During Treatment: Gait belt Activity Tolerance: Patient limited by lethargy;Patient limited by fatigue Patient left: in bed;with call bell/phone within reach;with bed alarm set;with family/visitor present Nurse Communication: Mobility status PT Visit Diagnosis: Other abnormalities of gait and mobility (R26.89);Muscle weakness (generalized) (M62.81);Difficulty in walking, not elsewhere  classified (R26.2);Pain Pain - Right/Left: Left Pain - part of body: Leg    Time: 4503-8882 PT Time Calculation (min) (ACUTE ONLY): 36 min   Charges:   PT Evaluation $PT Eval Moderate Complexity: 1 Mod          Chrystopher Stangl B. Migdalia Dk PT, DPT Acute Rehabilitation Services Pager [phone] Office [phone]   [place] 09/27/2018, 6:12 PM

## 2018-09-27 NOTE — Procedures (Signed)
ELECTROENCEPHALOGRAM REPORT   Patient: [name]       Room #: 7Q46N EEG No. ID: [id number] Age: 72 y.o.        Sex: female Referring Physician: [name] Report Date:  09/27/2018        Interpreting Physician: Alexis Goodell  History: [name] is an 72 y.o. female with episodes of right arm twitching  Medications:  Zyloprim, Pacerone, Eliquis, Lipitor, Phoslo, Tegretol, Aranesp, Colace, Solu-cortef, Plaquenil, Insulin, Lopressor, Protonix, Deltasone   Conditions of Recording:  This is a 21 channel routine scalp EEG performed with bipolar and monopolar montages arranged in accordance to the international 10/20 system of electrode placement. One channel was dedicated to EKG recording.  The patient is in the altered state.  Description:  Artifact is prominent during the recording often obscuring the background rhythm. When able to be visualized the background is slow and poorly organized.  It consists of a low voltage mixture of polymorphic delta and theta activity.  On rare occasions are noted short runs of fairly well organized superimposed beta activity noted as well.  This activity is continuous and persistent throughout the recording.   No epileptiform activity is noted.   Hyperventilation and intermittent photic stimulation were not performed.     IMPRESSION: This EEG is characterized by slowing which is consistent with normal drowse.  Can not rule out the possibility of slowing related to general cerebral disturbance such as a metabolic encephalopathy.  Clinical correlation recommended.  No epileptiform activity is noted.     Alexis Goodell, MD Neurology [phone] 09/27/2018, 6:30 PM

## 2018-09-27 NOTE — Progress Notes (Addendum)
[name] Progress Note  Background:  72 y.o.femalewith ESRD on hemodialysis T,Th,S at [facility]. ESRD  collapsing focal segmental nephrosclerosis, SLE,  DM,gout,COPD, Hx A fib/flutter (on Eliquis), chronic dCHF, Hx DVT (2015), PAD w/ AKA 05/2018 L TMA 09/06/18 and Lt AKA 09/26/18 .  Dialysis Orders: [facility] TTS 3.5hr EDW 61kg 180NRe 400/800 2/2  180NRe 400/800 Heparin 1850 unit bolus Mircera 225 q2weeks (last 09/14/18)  Assessment/Plan: 1. 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 Donnetta Hutching  2. ESRD - TTS - next HD Tuesday - hold heparin start on added K bath 3. Anemia - hgb 8.4 drop likely due to surgery  - had redose of 200 Aranesp 12/28 -  4. Secondary hyperparathyroidism - no VDRA - cont phoslo 5. HTN/volume - BP up some - titrate volume - lower EDW due to AKA 6. Nutrition - very low alb 1.6 - supplements/renal vit - intake poor 7. SLE - plaquenil/solulcortef 8. DM per primary SSI 9. Access needs - has 2nd stage BVT left arm which has occluded - appreciate Dr. Donnetta Hutching - assistance - for upper left arm AVGG in the future when current medical issues resolved. [place] - current meds 11. Afib - amio 12. Abd pain - some chronicity - to f/u with GI in Feb  [name], [place] [phone] 09/27/2018,8:41 AM  LOS: 3 days   I have seen and examined this patient and agree with the plan of care. Next HD Tuesday. Will request RT suctioning + toiletry + incentive spirometer; CXR already requested. High risk for aspiration.  Dwana Melena, MD 09/27/2018, 10:36 AM   Subjective:   C/o some pain in leg.  Hasn't eaten today  Objective Vitals:   09/26/18 1226 09/26/18 1649 09/26/18 2105 09/27/18 0338  BP: (!) 163/54 (!) 158/49 (!) 162/50 (!) 159/49  Pulse: 74 68 66 70  Resp: 16 16    Temp: (!) 97.5 F (36.4 C) 98.7 F (37.1 C) 98.5 F (36.9 C) 97.7 F (36.5 C)  TempSrc: Oral Oral Oral Oral  SpO2: 100% 100%  100% 100%  Weight:      Height:       Physical Exam General: groggy  Heart: RRR Lungs: no rales dim BS Abdomen: + BS soft - some right sided tenderness Extremities: left AKA dressed - right AKA no edema Dialysis Access:  Right IJ TDC - occluded  Left BVT   Additional Objective Labs: Basic Metabolic Panel: Recent Labs  Lab 09/25/18 0626 09/25/18 1221 09/26/18 0552  NA 133* 132* 136  K 3.7 4.2 3.2*  CL 94* 95* 98  CO2 25 24 27   GLUCOSE 144* 162* 175*  BUN 49* 54* 22  CREATININE 4.91* 5.11* 2.93*  CALCIUM 7.6* 7.6* 7.6*  PHOS  --  6.9* 3.8   Liver Function Tests: Recent Labs  Lab 09/24/18 1749 09/25/18 1221 09/26/18 0552  AST 31  --   --   ALT 9  --   --   ALKPHOS 72  --   --   BILITOT 0.6  --   --   PROT 6.3*  --   --   ALBUMIN 1.9* 1.6* 1.6*   No results for input(s): LIPASE, AMYLASE in the last 168 hours. CBC: Recent Labs  Lab 09/24/18 1749 09/24/18 1821 09/25/18 0626 09/26/18 0552  WBC 7.1  --  13.0* 9.9  NEUTROABS 5.9  --   --   --   HGB 9.5* 11.9* 8.2* 8.4*  HCT  33.2* 35.0* 28.3* 28.6*  MCV 94.9  --  93.4 93.8  PLT 192  --  169 171   Blood Culture    Component Value Date/Time   SDES SPUTUM 09/26/2018 2130   SPECREQUEST NONE 09/26/2018 2130   CULT  09/24/2018 1724    NO GROWTH 2 DAYS Performed at [facility], [place] [address] [place], [place] [address]    REPTSTATUS 09/27/2018 FINAL 09/26/2018 2130    Cardiac Enzymes: No results for input(s): CKTOTAL, CKMB, CKMBINDEX, TROPONINI in the last 168 hours. CBG: Recent Labs  Lab 09/26/18 0900 09/26/18 1151 09/26/18 1648 09/26/18 2103 09/27/18 0611  GLUCAP 139* 145* 198* 151* 141*   Iron Studies: No results for input(s): IRON, TIBC, TRANSFERRIN, FERRITIN in the last 72 hours. Lab Results  Component Value Date   INR 1.17 09/26/2018   INR 1.18 06/08/2018   INR 1.05 03/15/2018   Studies/Results: Dg Abd 1 View  Result Date: 09/25/2018 CLINICAL DATA:  Abdominal and right hip  pain. EXAM: ABDOMEN - 1 VIEW COMPARISON:  07/28/2018 CT and 12/16/2017 abdomen radiograph FINDINGS: Scattered air containing small large bowel without bowel obstruction. Lumbar spondylosis with degenerative disc disease is identified greatest at L2-3. Mild joint space narrowing of both hips. No radiopaque calculi nor acute osseous abnormality. IMPRESSION: Nonspecific bowel gas pattern without bowel obstruction. Lumbar spondylosis. Electronically Signed   By: Ashley Royalty M.D.   On: 09/25/2018 22:37   Dg Chest Port 1 View  Result Date: 09/27/2018 CLINICAL DATA:  Chest congestion and cough began this morning. EXAM: PORTABLE CHEST 1 VIEW COMPARISON:  Portable chest x-ray of September 24, 2018 FINDINGS: The lungs are well-expanded. Slightly increased lung markings are noted greatest on the left. There is a small amount of thickening of the minor fissure. The cardiac silhouette is mildly enlarged though stable. The pulmonary vascularity is slightly more conspicuous. The dialysis catheter tip projects over the distal third of the SVC. There is calcification in the wall of the tortuous thoracic aorta. IMPRESSION: Findings suggest low-grade CHF with mild asymmetric interstitial edema greatest on the left. Stable enlargement of the cardiac silhouette. No pleural effusion. Thoracic aortic atherosclerosis. Electronically Signed   By: David  Martinique M.D.   On: 09/27/2018 08:09   Dg Hip Unilat With Pelvis 1v Right  Result Date: 09/25/2018 CLINICAL DATA:  Right hip pain EXAM: DG HIP (WITH OR WITHOUT PELVIS) 1V RIGHT COMPARISON:  None. FINDINGS: L4-5 degenerative disc disease with lower lumbar facet arthropathy is seen. The bony pelvis appears intact without acute fracture or suspicious osseous lesions. No pelvic diastasis is noted. Mild-to-moderate joint space narrowing of both hips appears stable. Mild subchondral sclerosis of the femoral heads are identified without flattening. Findings may be degenerative in etiology.  Subtle changes of AVN are not entirely excluded. Soft tissue calcification adjacent to the greater trochanter of the right femur may reflect gluteal tendinosis. IMPRESSION: 1. No acute osseous abnormality of the bony pelvis and right hip. 2. Mild-to-moderate degenerative joint space narrowing of both hips. Subchondral sclerosis of the femoral heads bilaterally without flattening may reflect osteoarthritis. Changes of AVN are not entirely excluded. 3. Soft tissue calcification adjacent to the greater trochanter of the right femur may reflect gluteal tendinosis. Electronically Signed   By: Ashley Royalty M.D.   On: 09/25/2018 22:40   Medications: . sodium chloride    . sodium chloride    . sodium chloride    . cefTRIAXone (ROCEPHIN)  IV 1 g (09/26/18 2106)  .  magnesium sulfate 1 - 4 g bolus IVPB    . vancomycin 750 mg (09/25/18 1631)   . allopurinol  100 mg Oral Q M,W,F  . amiodarone  200 mg Oral Daily  . atorvastatin  20 mg Oral QHS  . brimonidine  1 drop Left Eye BID   And  . timolol  1 drop Left Eye BID  . calcium acetate  667 mg Oral TID WC  . carbamazepine  100 mg Oral BID  . Chlorhexidine Gluconate Cloth  6 each Topical Q0600  . clotrimazole  10 mg Oral 5 X Daily  . darbepoetin (ARANESP) injection - DIALYSIS  200 mcg Intravenous Q Sat-HD  . docusate sodium  100 mg Oral BID  . feeding supplement (PRO-STAT SUGAR FREE 64)  30 mL Oral BID  . guaiFENesin  600 mg Oral BID  . heparin  1,900 Units Dialysis Once in dialysis  . hydrocortisone sod succinate (SOLU-CORTEF) inj  25 mg Intravenous Q12H  . hydroxychloroquine  200 mg Oral Daily  . insulin aspart  0-5 Units Subcutaneous QHS  . insulin aspart  0-9 Units Subcutaneous TID WC  . latanoprost  1 drop Both Eyes QHS  . metoprolol tartrate  12.5 mg Oral 2 times per day on Mon Wed Fri  . multivitamin  1 tablet Oral Daily  . pantoprazole  40 mg Oral Daily  . sodium chloride flush  3 mL Intravenous Q12H  . Travoprost (BAK Free)  1 drop Both  Eyes QHS

## 2018-09-27 NOTE — Plan of Care (Signed)
  Problem: Safety: Goal: Ability to remain free from injury will improve Outcome: Progressing   

## 2018-09-28 DIAGNOSIS — I5033 Acute on chronic diastolic (congestive) heart failure: Secondary | ICD-10-CM

## 2018-09-28 DIAGNOSIS — Z992 Dependence on renal dialysis: Secondary | ICD-10-CM | POA: Diagnosis not present

## 2018-09-28 DIAGNOSIS — N186 End stage renal disease: Secondary | ICD-10-CM | POA: Diagnosis not present

## 2018-09-28 DIAGNOSIS — E1129 Type 2 diabetes mellitus with other diabetic kidney complication: Secondary | ICD-10-CM | POA: Diagnosis not present

## 2018-09-28 LAB — RENAL FUNCTION PANEL
ANION GAP: 13 (ref 5–15)
Albumin: 1.7 g/dL — ABNORMAL LOW (ref 3.5–5.0)
BUN: 49 mg/dL — ABNORMAL HIGH (ref 8–23)
CO2: 24 mmol/L (ref 22–32)
Calcium: 8.3 mg/dL — ABNORMAL LOW (ref 8.9–10.3)
Chloride: 99 mmol/L (ref 98–111)
Creatinine, Ser: 5.38 mg/dL — ABNORMAL HIGH (ref 0.44–1.00)
GFR calc Af Amer: 9 mL/min — ABNORMAL LOW (ref >60)
GFR calc non Af Amer: 7 mL/min — ABNORMAL LOW (ref >60)
Glucose, Bld: 141 mg/dL — ABNORMAL HIGH (ref 70–99)
POTASSIUM: 3.4 mmol/L — AB (ref 3.5–5.1)
Phosphorus: 3.9 mg/dL (ref 2.5–4.6)
Sodium: 136 mmol/L (ref 135–145)

## 2018-09-28 LAB — GLUCOSE, CAPILLARY
GLUCOSE-CAPILLARY: 140 mg/dL — AB (ref 70–99)
GLUCOSE-CAPILLARY: 85 mg/dL (ref 70–99)
Glucose-Capillary: 85 mg/dL (ref 70–99)
Glucose-Capillary: 139 mg/dL — ABNORMAL HIGH (ref 70–99)
Glucose-Capillary: 157 mg/dL — ABNORMAL HIGH (ref 70–99)

## 2018-09-28 LAB — CBC
HCT: 30.3 % — ABNORMAL LOW (ref 36.0–46.0)
HEMOGLOBIN: 8.8 g/dL — AB (ref 12.0–15.0)
MCH: 27.1 pg (ref 26.0–34.0)
MCHC: 29 g/dL — ABNORMAL LOW (ref 30.0–36.0)
MCV: 93.2 fL (ref 80.0–100.0)
Platelets: 200 10*3/uL (ref 150–400)
RBC: 3.25 MIL/uL — ABNORMAL LOW (ref 3.87–5.11)
RDW: 17.7 % — ABNORMAL HIGH (ref 11.5–15.5)
WBC: 10.4 10*3/uL (ref 4.0–10.5)
nRBC: 0 % (ref 0.0–0.2)

## 2018-09-28 MED ORDER — LIDOCAINE HCL (PF) 1 % IJ SOLN: 5.0000 mL | INTRAMUSCULAR | Status: DC | PRN

## 2018-09-28 MED ORDER — SODIUM CHLORIDE 0.9 % IV SOLN: 100.0000 mL | INTRAVENOUS | Status: DC | PRN

## 2018-09-28 MED ORDER — LIDOCAINE-PRILOCAINE 2.5-2.5 % EX CREA: 1.0000 "application " | TOPICAL_CREAM | CUTANEOUS | Status: DC | PRN

## 2018-09-28 MED ORDER — HEPARIN SODIUM (PORCINE) 1000 UNIT/ML DIALYSIS
1000.0000 [IU] | INTRAMUSCULAR | Status: DC | PRN
  Administered 2018-09-28: 1000 [IU] via INTRAVENOUS_CENTRAL
  Filled 2018-09-28: qty 1

## 2018-09-28 MED ORDER — ALTEPLASE 2 MG IJ SOLR: 2.0000 mg | Freq: Once | INTRAMUSCULAR | Status: DC | PRN

## 2018-09-28 MED ORDER — HEPARIN SODIUM (PORCINE) 1000 UNIT/ML IJ SOLN
INTRAMUSCULAR | Status: AC
  Administered 2018-09-28: 1000 [IU] via INTRAVENOUS_CENTRAL
  Filled 2018-09-28: qty 4

## 2018-09-28 MED ORDER — PENTAFLUOROPROP-TETRAFLUOROETH EX AERO: 1.0000 "application " | INHALATION_SPRAY | CUTANEOUS | Status: DC | PRN

## 2018-09-28 NOTE — Progress Notes (Addendum)
[facility] Progress Note   Subjective: Says she is having diarrhea-currently lying in brown liquid stool. Decreased oral pain, eating better, feeling somewhat better.   Objective Vitals:   09/28/18 0723 09/28/18 0728 09/28/18 0730 09/28/18 0800  BP: (!) 182/53 (!) 173/52 (!) 178/60 (!) 187/99  Pulse: 63 62 62 70  Resp:      Temp:      TempSrc:      SpO2:      Weight:      Height:       Physical Exam General: Chronically ill appearing female in NAD Heart: Q3,F3 1/6 systolic M.  Lungs: CTAB Abdomen:active BS, having diarrhea Extremities: L AKA with ace wrap CDI, R AKA no stump edema Dialysis Access: RIJ TDC blood lines connected.   Additional Objective Labs: Basic Metabolic Panel: Recent Labs  Lab 09/26/18 0552 09/27/18 0917 09/28/18 0328  NA 136 136 136  K 3.2* 3.0* 3.4*  CL 98 99 99  CO2 27 25 24   GLUCOSE 175* 117* 141*  BUN 22 39* 49*  CREATININE 2.93* 4.35* 5.38*  CALCIUM 7.6* 8.0* 8.3*  PHOS 3.8 3.0 3.9   Liver Function Tests: Recent Labs  Lab 09/24/18 1749  09/26/18 0552 09/27/18 0917 09/28/18 0328  AST 31  --   --   --   --   ALT 9  --   --   --   --   ALKPHOS 72  --   --   --   --   BILITOT 0.6  --   --   --   --   PROT 6.3*  --   --   --   --   ALBUMIN 1.9*   < > 1.6* 1.6* 1.7*   < > = values in this interval not displayed.   No results for input(s): LIPASE, AMYLASE in the last 168 hours. CBC: Recent Labs  Lab 09/24/18 1749  09/25/18 0626 09/26/18 0552 09/27/18 0917 09/28/18 0328  WBC 7.1  --  13.0* 9.9 8.7 10.4  NEUTROABS 5.9  --   --   --   --   --   HGB 9.5*   < > 8.2* 8.4* 8.3* 8.8*  HCT 33.2*   < > 28.3* 28.6* 28.4* 30.3*  MCV 94.9  --  93.4 93.8 92.8 93.2  PLT 192  --  169 171 205 200   < > = values in this interval not displayed.   Blood Culture    Component Value Date/Time   SDES SPUTUM 09/26/2018 2130   SPECREQUEST NONE 09/26/2018 2130   CULT  09/24/2018 1724    NO GROWTH 4 DAYS Performed at [facility], [place] [address] [place], [place] [address]    REPTSTATUS 09/27/2018 FINAL 09/26/2018 2130    Cardiac Enzymes: No results for input(s): CKTOTAL, CKMB, CKMBINDEX, TROPONINI in the last 168 hours. CBG: Recent Labs  Lab 09/27/18 0611 09/27/18 1135 09/27/18 1551 09/27/18 2103 09/28/18 0624  GLUCAP 141* 84 115* 136* 140*   Iron Studies: No results for input(s): IRON, TIBC, TRANSFERRIN, FERRITIN in the last 72 hours. @lablastinr3 @ Studies/Results: Dg Chest Port 1 View  Result Date: 09/27/2018 CLINICAL DATA:  Chest congestion and cough began this morning. EXAM: PORTABLE CHEST 1 VIEW COMPARISON:  Portable chest x-ray of September 24, 2018 FINDINGS: The lungs are well-expanded. Slightly increased lung markings are noted greatest on the left. There is a small amount of thickening of the minor fissure. The cardiac silhouette is  mildly enlarged though stable. The pulmonary vascularity is slightly more conspicuous. The dialysis catheter tip projects over the distal third of the SVC. There is calcification in the wall of the tortuous thoracic aorta. IMPRESSION: Findings suggest low-grade CHF with mild asymmetric interstitial edema greatest on the left. Stable enlargement of the cardiac silhouette. No pleural effusion. Thoracic aortic atherosclerosis. Electronically Signed   By: David  Martinique M.D.   On: 09/27/2018 08:09   Medications: . sodium chloride    . sodium chloride    . sodium chloride    . sodium chloride    . sodium chloride    . magnesium sulfate 1 - 4 g bolus IVPB     . allopurinol  100 mg Oral Q M,W,F  . amiodarone  200 mg Oral Daily  . apixaban  2.5 mg Oral BID  . atorvastatin  20 mg Oral QHS  . brimonidine  1 drop Left Eye BID   And  . timolol  1 drop Left Eye BID  . calcium acetate  667 mg Oral TID WC  . carbamazepine  100 mg Oral BID  . Chlorhexidine Gluconate Cloth  6 each Topical Q0600  . clotrimazole  10 mg Oral 5 X Daily  . darbepoetin (ARANESP) injection  - DIALYSIS  200 mcg Intravenous Q Sat-HD  . docusate sodium  100 mg Oral BID  . feeding supplement (PRO-STAT SUGAR FREE 64)  30 mL Oral BID  . guaiFENesin  600 mg Oral BID  . hydroxychloroquine  200 mg Oral Daily  . insulin aspart  0-5 Units Subcutaneous QHS  . insulin aspart  0-9 Units Subcutaneous TID WC  . ipratropium-albuterol  3 mL Nebulization BID  . latanoprost  1 drop Both Eyes QHS  . metoprolol tartrate  12.5 mg Oral 2 times per day on Mon Wed Fri  . multivitamin  1 tablet Oral Daily  . pantoprazole  40 mg Oral Daily  . predniSONE  10 mg Oral Q breakfast  . sodium chloride flush  3 mL Intravenous Q12H  . Travoprost (BAK Free)  1 drop Both Eyes QHS     Dialysis Orders: [facility] TTS 3.5hr EDW [id number] 400/800 2/2180NRe 400/800 Heparin 1850 unit bolus Mircera 225 q2weeks (last 09/14/18)  Assessment/Plan: 1. 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 Donnetta Hutching  2. ESRD - TTS - On HD tolerating well.  Holding heparin start on added K bath 3. Anemia - hgb 8.8- had redose of 200 Aranesp 12/28 - Follow HGB 4. Secondary hyperparathyroidism -Ca 8.3 Phos 3.9 no VDRA - cont phoslo 5. HTN/volume - BP elevated, set UFG to 3.0 liters. Lower EDW on DC.  6. Nutrition - Albumin 1.7 - supplements/renal vit - poor PO intake/Sepsis.  7. SLE - plaquenil/solulcortef 8. DM per primary SSI 9. Access needs - has 2nd stage BVT left arm which has occluded - appreciate Dr. Donnetta Hutching - assistance - for upper left arm AVGG in the future when current medical issues resolved. [place] - current meds 11. Afib - amiodarone/resumed apixaban.  12. Abd pain - some chronicity - to f/u with GI in Feb    Rita H. Brown NP-C 09/28/2018, 8:38 AM  [employer] [phone]  I have seen and examined this patient and agree with the plan of care. Pt seen on HD 116/91  W/ 2.7L net UF goal 400/800  4k/2.25Ca  Cough but no infiltrates; e/o vol overload and HD today will help. Her main concern is  the RLQ abd discomfort.  Dwana Melena, MD 09/28/2018, 10:21 AM

## 2018-09-28 NOTE — Progress Notes (Signed)
Inpatient Rehabilitation Admissions Coordinator  Both PT and OT are recommending SNF at this time. Pt unable to do the intensity of an inpt rehab program. I have notified RN CM and SW. We will sign off at this time.  Danne Baxter, RN, MSN Rehab Admissions Coordinator [phone] 09/28/2018 10:02 AM

## 2018-09-28 NOTE — Plan of Care (Signed)

## 2018-09-28 NOTE — Progress Notes (Signed)
PROGRESS NOTE  Dana BARRELL MGQ:[id number] DOB: Apr 15, 1946 DOA: 09/24/2018 PCP: Sharilyn Sites, MD  Brief History   72 year old woman PMH transmetatarsal amputation 12/9, presented to vascular surgery office with left foot pain and found to have infection.  Was admitted for further management, underwent left above-the-knee amputation 12/29.  A & P  Left foot wound infection, seen by vascular surgery, underwent left above-knee amputation 12/29.  Cultures negative.  WBC within normal limits.  Afebrile.  Antibiotics discontinued. --Continue management per vascular surgery.  Acute hypoxic respiratory failure  -- Presumably secondary to COPD.  Wean oxygen as tolerated.  COPD --Appears stable.  Continue bronchodilators, supplemental oxygen.  Wean as tolerated.  Right forearm twitching, etiology unclear, CT head unrevealing, Dr. [name] discussed with neurology, incidental findings on head CT felt to be noncontributory.  Apparently asterixis noted yesterday. --Appears resolved. --EEG nonspecific --Serum ammonia was within normal limits.  End-stage renal disease --Continue hemodialysis per nephrology  Acute on chronic diastolic CHF --Management per nephrology with hemodialysis.  Anemia of CKD --Anemia stable.  Thrush --Continue current treatment.  Paroxysmal atrial fibrillation, CHADs score 6 --Continue apixaban, amiodarone, metoprolol  Chronic right-sided abdominal pain --Appears stable.  Husband reports this has been present since 2018.  Follow-up with gastroenterologist Dr. [name] as an outpatient  SLE on prednisone at home --Continue prednisone, Plaquenil  Diabetes mellitus type 2 with peripheral neuropathy and end-stae renal disease.  Hemoglobin A1c 4.5. --Sliding scale insulin as needed but blood sugars well very controlled.  Acute metabolic encephalopathy, resolved.  CT negative.     DVT prophylaxis: apixaban Code Status: [place] Family Communication: husband  at bedside Disposition Plan: pending    Murray Hodgkins, MD  [facility] Direct contact: [phone] --[place] [place]  --[email or web address]; password [email or web address]  7PM-7AM contact night coverage as above 09/28/2018, 4:23 PM  LOS: 4 days   Consultants:  Vascular sugery  Procedures:  EEG  IMPRESSION: This EEG is characterized by slowing which is consistent with normal drowse.  Can not rule out the possibility of slowing related to general cerebral disturbance such as a metabolic encephalopathy.  Clinical correlation recommended.  No epileptiform activity is noted.    Antimicrobials:    Interval history/Subjective: Less abdominal pain, feeling better.  Large amount of diarrhea during hemodialysis today.  No nausea or vomiting.  Reports some mouth pain and difficulty swallowing.  Objective: Vitals:  Vitals:   09/28/18 1054 09/28/18 1444  BP: (!) 159/47 (!) 160/34  Pulse: 68 81  Resp: 20 (!) 22  Temp: (!) 97.4 F (36.3 C) 98.2 F (36.8 C)  SpO2: 100% 100%    Exam:  Constitutional:  . Appears calm, uncomfortable, chronically ill but nontoxic. Eyes:  . pupils and irises appear normal ENMT:  . grossly normal hearing  . White exudate on tongue Respiratory:  . CTA bilaterally, no w/r/r.  Significant upper airway noise. Marland Kitchen Respiratory effort mildly increased. Cardiovascular:  . RRR, no m/r/g . No right lower extremity stump edema.  Left stump bandaged. . Telemetry sinus rhythm Abdomen:  . Soft, nontender, nondistended Psychiatric:  . Mental status o Mood, affect appropriate  I have personally reviewed the following:   Data: . CBG very well controlled . Potassium 3.4, remainder BMP consistent with end-stage renal disease. Marland Kitchen Hemoglobin stable, 8.3.  No leukocytosis.  Platelets within normal limits. . Chest x-ray 12/30 showed some edema left greater than right. . EKG nonacute, sinus rhythm  Scheduled Meds: . allopurinol  100 mg Oral Q M,W,F  .  amiodarone   200 mg Oral Daily  . apixaban  2.5 mg Oral BID  . atorvastatin  20 mg Oral QHS  . brimonidine  1 drop Left Eye BID   And  . timolol  1 drop Left Eye BID  . calcium acetate  667 mg Oral TID WC  . carbamazepine  100 mg Oral BID  . Chlorhexidine Gluconate Cloth  6 each Topical Q0600  . clotrimazole  10 mg Oral 5 X Daily  . darbepoetin (ARANESP) injection - DIALYSIS  200 mcg Intravenous Q Sat-HD  . docusate sodium  100 mg Oral BID  . feeding supplement (PRO-STAT SUGAR FREE 64)  30 mL Oral BID  . guaiFENesin  600 mg Oral BID  . hydroxychloroquine  200 mg Oral Daily  . ipratropium-albuterol  3 mL Nebulization BID  . latanoprost  1 drop Both Eyes QHS  . metoprolol tartrate  12.5 mg Oral 2 times per day on Mon Wed Fri  . multivitamin  1 tablet Oral Daily  . pantoprazole  40 mg Oral Daily  . predniSONE  10 mg Oral Q breakfast  . sodium chloride flush  3 mL Intravenous Q12H  . Travoprost (BAK Free)  1 drop Both Eyes QHS   Continuous Infusions: . sodium chloride    . magnesium sulfate 1 - 4 g bolus IVPB      Principal Problem:   Left foot infection Active Problems:   SLE (systemic lupus erythematosus) (HCC)   Essential hypertension, benign   COPD (chronic obstructive pulmonary disease) (HCC)   Chronic diastolic heart failure (HCC)   Mixed hyperlipidemia   Atrial fibrillation (HCC)   Type II diabetes mellitus with peripheral autonomic neuropathy (HCC)   ESRD on dialysis (HCC)   S/P AKA (above knee amputation) unilateral, right (HCC)   Status post amputation of left foot through metatarsal bone (HCC)   GERD (gastroesophageal reflux disease)   Gout   S/P AKA (above knee amputation) bilateral (HCC)   Acute on chronic anemia   Supplemental oxygen dependent   Hypokalemia   Hypertension   Acute on chronic diastolic CHF (congestive heart failure) ([place])   LOS: 4 days

## 2018-09-29 DIAGNOSIS — R627 Adult failure to thrive: Secondary | ICD-10-CM

## 2018-09-29 LAB — RENAL FUNCTION PANEL
Albumin: 1.6 g/dL — ABNORMAL LOW (ref 3.5–5.0)
Anion gap: 10 (ref 5–15)
BUN: 16 mg/dL (ref 8–23)
CHLORIDE: 99 mmol/L (ref 98–111)
CO2: 27 mmol/L (ref 22–32)
CREATININE: 3.09 mg/dL — AB (ref 0.44–1.00)
Calcium: 8.2 mg/dL — ABNORMAL LOW (ref 8.9–10.3)
GFR calc Af Amer: 17 mL/min — ABNORMAL LOW (ref >60)
GFR calc non Af Amer: 14 mL/min — ABNORMAL LOW (ref >60)
Glucose, Bld: 115 mg/dL — ABNORMAL HIGH (ref 70–99)
Phosphorus: 1.4 mg/dL — ABNORMAL LOW (ref 2.5–4.6)
Potassium: 3.6 mmol/L (ref 3.5–5.1)
Sodium: 136 mmol/L (ref 135–145)

## 2018-09-29 LAB — CULTURE, BLOOD (ROUTINE X 2)
Culture: NO GROWTH
Culture: NO GROWTH
SPECIAL REQUESTS: ADEQUATE
Special Requests: ADEQUATE

## 2018-09-29 LAB — GLUCOSE, CAPILLARY
Glucose-Capillary: 85 mg/dL (ref 70–99)
Glucose-Capillary: 164 mg/dL — ABNORMAL HIGH (ref 70–99)
Glucose-Capillary: 339 mg/dL — ABNORMAL HIGH (ref 70–99)
Glucose-Capillary: 290 mg/dL — ABNORMAL HIGH (ref 70–99)

## 2018-09-29 MED ORDER — INSULIN ASPART 100 UNIT/ML ~~LOC~~ SOLN
0.0000 [IU] | Freq: Three times a day (TID) | SUBCUTANEOUS | Status: DC
  Administered 2018-09-29: 7 [IU] via SUBCUTANEOUS
  Administered 2018-09-30: 1 [IU] via SUBCUTANEOUS
  Administered 2018-10-01: 2 [IU] via SUBCUTANEOUS

## 2018-09-29 MED ORDER — INSULIN ASPART 100 UNIT/ML ~~LOC~~ SOLN
0.0000 [IU] | Freq: Every day | SUBCUTANEOUS | Status: DC
  Administered 2018-09-29: 3 [IU] via SUBCUTANEOUS
  Administered 2018-09-30: 2 [IU] via SUBCUTANEOUS

## 2018-09-29 MED ORDER — POTASSIUM PHOSPHATES 15 MMOLE/5ML IV SOLN
30.0000 mmol | Freq: Once | INTRAVENOUS | Status: AC
  Administered 2018-09-29: 30 mmol via INTRAVENOUS
  Filled 2018-09-29: qty 10

## 2018-09-29 NOTE — Progress Notes (Signed)
   Dressing down on above-knee amputation today looks good with staples in place Permanent dialysis access decision pending.  Brandon C. Donzetta Matters, MD [facility] Office: [phone] Pager: [phone]

## 2018-09-29 NOTE — Progress Notes (Signed)
Orthopedic Tech Progress Note Patient Details:  [name] May 02, 1946 [id number]  Patient ID: [name], female   DOB: 1946-09-24, 73 y.o.   MRN: [id number] Called in order to [place] 09/29/2018, 11:39 AM

## 2018-09-29 NOTE — Progress Notes (Signed)
[facility] Progress Note  Dialysis Orders: [facility] TTS 3.5hr EDW [id number] 400/800 2/2180NRe 400/800 Heparin 1850 unit bolus Mircera 225 q2weeks (last 09/14/18)  Assessment/ Plan:   : 1.Left AKAdue to infection/gangrene 12/29 Dr. Donnetta Hutching  2. ESRD- TTS - On HD tolerating well.    Plan on HD Thursday; no acute indication today.  3. Anemia- hgb 8.8- had redose of 200 Aranesp 12/28 - Follow HGB 4. Secondary hyperparathyroidism-Ca 8.3 Phos 3.9 no VDRA - cont phoslo 5.HTN/volume- BP elevated, set UFG to 3.0 liters. Lower EDW on DC.  6. Nutrition- Albumin 1.7- supplements/renal vit - poor PO intake/Sepsis.  7. SLE - plaquenil/solulcortef 8. DM per primarySSI 9. Access needs- has 2nd stage BVT left arm which has occluded - appreciate Dr. Donnetta Hutching - assistance - for upper left arm AVG in the future when current medical issues resolved. [place] - current meds 11.Afib- amiodarone/resumed apixaban.  12. Abd pain - some chronicity - to f/u with GI in Feb  Subjective:   She is having diarrhea. She does have an appetite now but feels very weak. Non productive cough; denies f/c.     Objective:   BP (!) 172/56   Pulse 98   Temp 99.5 F (37.5 C) (Oral)   Resp 20   Ht 5' (1.524 m)   Wt 54.7 kg   SpO2 99%   BMI 23.55 kg/m   Intake/Output Summary (Last 24 hours) at 09/29/2018 1006 Last data filed at 09/28/2018 1300 Gross per 24 hour  Intake 240 ml  Output 1941 ml  Net -1701 ml   Weight change:   Physical Exam: General: Chronically ill appearing female in NAD Heart: X3,G1 1/6 systolic M.  Lungs: CTAB Abdomen:active BS, having diarrhea Extremities: L AKA, R AKA no stump edema Dialysis Access: RIJ TDC blood lines connected.  Imaging: No results found.  Labs: BMET Recent Labs  Lab 09/24/18 1749 09/24/18 1821 09/25/18 0626 09/25/18 1221 09/26/18 0552 09/27/18 0917 09/28/18 0328 09/29/18 0342  NA 133* 132* 133* 132* 136 136 136 136  K 3.6  3.8 3.7 4.2 3.2* 3.0* 3.4* 3.6  CL 94* 95* 94* 95* 98 99 99 99  CO2 31  --  25 24 27 25 24 27   GLUCOSE 114* 107* 144* 162* 175* 117* 141* 115*  BUN 38* 48* 49* 54* 22 39* 49* 16  CREATININE 4.22* 4.30* 4.91* 5.11* 2.93* 4.35* 5.38* 3.09*  CALCIUM 7.8*  --  7.6* 7.6* 7.6* 8.0* 8.3* 8.2*  PHOS  --   --   --  6.9* 3.8 3.0 3.9 1.4*   CBC Recent Labs  Lab 09/24/18 1749  09/25/18 0626 09/26/18 0552 09/27/18 0917 09/28/18 0328  WBC 7.1  --  13.0* 9.9 8.7 10.4  NEUTROABS 5.9  --   --   --   --   --   HGB 9.5*   < > 8.2* 8.4* 8.3* 8.8*  HCT 33.2*   < > 28.3* 28.6* 28.4* 30.3*  MCV 94.9  --  93.4 93.8 92.8 93.2  PLT 192  --  169 171 205 200   < > = values in this interval not displayed.    Medications:    . allopurinol  100 mg Oral Q M,W,F  . amiodarone  200 mg Oral Daily  . apixaban  2.5 mg Oral BID  . atorvastatin  20 mg Oral QHS  . brimonidine  1 drop Left Eye BID   And  . timolol  1 drop Left Eye BID  .  calcium acetate  667 mg Oral TID WC  . carbamazepine  100 mg Oral BID  . Chlorhexidine Gluconate Cloth  6 each Topical Q0600  . clotrimazole  10 mg Oral 5 X Daily  . darbepoetin (ARANESP) injection - DIALYSIS  200 mcg Intravenous Q Sat-HD  . docusate sodium  100 mg Oral BID  . feeding supplement (PRO-STAT SUGAR FREE 64)  30 mL Oral BID  . guaiFENesin  600 mg Oral BID  . hydroxychloroquine  200 mg Oral Daily  . ipratropium-albuterol  3 mL Nebulization BID  . latanoprost  1 drop Both Eyes QHS  . metoprolol tartrate  12.5 mg Oral 2 times per day on Mon Wed Fri  . multivitamin  1 tablet Oral Daily  . pantoprazole  40 mg Oral Daily  . predniSONE  10 mg Oral Q breakfast  . sodium chloride flush  3 mL Intravenous Q12H  . Travoprost (BAK Free)  1 drop Both Eyes QHS      Otelia Santee, MD 09/29/2018, 10:06 AM

## 2018-09-29 NOTE — Evaluation (Signed)
Clinical/Bedside Swallow Evaluation Patient Details  Name: [name] MRN: [id number] Date of Birth: 1946/08/02  Today's Date: 09/29/2018 Time: SLP Start Time (ACUTE ONLY): 0820 SLP Stop Time (ACUTE ONLY): 0841 SLP Time Calculation (min) (ACUTE ONLY): 21 min  Past Medical History:  Past Medical History:  Diagnosis Date  . Anemia   . Arthritis   . Asthma   . Atrial fibrillation ([place])   . Atrial flutter ([place])    a. s/p TEE/DCCV December 2013 b. 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 Bone spur of toe    Right 5th toe  . Chronic diastolic CHF (congestive heart failure) (HCC)    a. EF 50-55% by echo in 2015  . Colon polyposis   . COPD (chronic obstructive pulmonary disease) ([place])   . DVT of upper extremity (deep vein thrombosis) ([place])    Right basilic vein, June 3086  . ESRD (end stage renal disease) on dialysis ([place])    "TTF; [place]; off [employer]." (09/07/2018)  . Essential hypertension, benign   . Fractures   . Glaucoma   . Lupus ([place])   . Pericarditis   . SLE (systemic lupus erythematosus) ([place])   . Type 2 diabetes mellitus ([place])   . UTI (lower urinary tract infection)   . Wound of right leg 06/2017   Past Surgical History:  Past Surgical History:  Procedure Laterality Date  . ABDOMINAL AORTOGRAM W/LOWER EXTREMITY N/A 12/18/2017   Procedure: ABDOMINAL AORTOGRAM W/LOWER EXTREMITY;  Surgeon: Elam Dutch, MD;  Location: [facility];  Service: Cardiovascular;  Laterality: N/A;  bilateral  . ABDOMINAL HYSTERECTOMY  1983  . AMPUTATION Right 06/08/2018   Procedure: RIGHT ABOVE KNEE AMPUTATION;  Surgeon: Elam Dutch, MD;  Location: [facility] OR;  Service: Vascular;  Laterality: Right;  . AMPUTATION Left 09/26/2018   Procedure: AMPUTATION ABOVE KNEE;  Surgeon: Rosetta Posner, MD;   Location: [facility] OR;  Service: Vascular;  Laterality: Left;  WILL BE AN INPATIENT  . ANKLE SURGERY  1993  . AV FISTULA PLACEMENT Left 03/27/2014   Procedure: ARTERIOVENOUS (AV) FISTULA CREATION;  Surgeon: Rosetta Posner, MD;  Location: [facility];  Service: Vascular;  Laterality: Left;  . BACK SURGERY  1980  . BASCILIC VEIN TRANSPOSITION Left 01/13/2018   Procedure: LEFT BASILIC VEIN TRANSPOSITION SECOND STAGE;  Surgeon: Elam Dutch, MD;  Location: [facility];  Service: Vascular;  Laterality: Left;  . CARDIOVASCULAR STRESS TEST  12/19/2009   no stress induced rhythm abnormalities, ekg negative for ischemia  . CARDIOVERSION  09/16/2012   Procedure: CARDIOVERSION;  Surgeon: Sanda Klein, MD;  Location: [facility];  Service: Cardiovascular;  Laterality: N/A;  . CATARACT EXTRACTION    . COLONOSCOPY  09/20/2012   Dr. [name]: multiple tubular adenomas   . COLONOSCOPY  2005   Dr. [name]. Polyps, path unknown   . COLONOSCOPY N/A 07/24/2016   Dr. [name]: 3 significant size polyps removed from the colon, tubular adenomas.  Next colonoscopy in October 2020.  Marland Kitchen ESOPHAGOGASTRODUODENOSCOPY  09/20/2012   Dr. [name]: duodenal erosion and possible resolving ulcer at the angularis   . ESOPHAGOGASTRODUODENOSCOPY N/A 03/24/2018   Dr. [name]: Mild erosive reflux esophagitis, erosive gastropathy and enteropathy fairly extensive, no H. pylori on biopsy.  Marland Kitchen EYE SURGERY    . IR FLUORO GUIDE CV LINE RIGHT  11/09/2017  .  IR RADIOLOGIST EVAL & MGMT  09/01/2018  . IR US GUIDE VASC ACCESS RIGHT  11/09/2017  . REVISON OF ARTERIOVENOUS FISTULA Left 03/15/2018   Procedure: REVISION OF Left arm BASILIC VEIN TRANSPOSITION;  Surgeon: Angelia Mould, MD;  Location: [facility];  Service: Vascular;  Laterality: Left;  . TEE WITHOUT CARDIOVERSION  09/16/2012   Procedure: TRANSESOPHAGEAL ECHOCARDIOGRAM (TEE);  Surgeon: Sanda Klein, MD;  [facility] END[facility];  Service: Cardiovascular;  Laterality: N/A;  . TRANSESOPHAGEAL  ECHOCARDIOGRAM WITH CARDIOVERSION  09/16/2012   EF 60-65%, moderate LVH, moderate regurg of the aortic valve, LA moderately dilated  . TRANSMETATARSAL AMPUTATION Left 09/06/2018   Procedure: TRANSMETATARSAL AMPUTATION;  Surgeon: Angelia Mould, MD;  Location[facility];  Service: Vascular;  Laterality: Left;   HPI:  73 y.o. female with medical history significant of hypertension, hyperlipidemia, diabetes mellitus, COPD, GERD, gout, and anemia, atrial fibrillation and DVT on Eliquis, ESRD-HD (TTS), lupus, PAD, R AKA (05/2018), now s/p L AKA   Assessment / Plan / Recommendation Clinical Impression  Pt presents with episodic swallowing difficulty this date. A suspected mild to moderate oropharyngeal dysphagia was exhibited likely in setting of pt deconditioning and comorbidities. Pt with generalized oral weakness, reduced timely AP transit, decreased bolus cohesion, and reduced labial closure with spillage at midline with thin liquids via cup. Pt with one instance of overt weak cough following initial cup sip of thin liquids. Subsequent trials of thin liquids were without overt difficulty. Continue puree and thin liquid diet with medicines whole in puree and safe swallow precautions. SLP to follow up   SLP Visit Diagnosis: Dysphagia, unspecified (R13.10)    Aspiration Risk  Moderate aspiration risk;Mild aspiration risk    Diet Recommendation     Medication Administration: Whole meds with puree    Other  Recommendations Oral Care Recommendations: Oral care BID   Follow up Recommendations Skilled Nursing facility      Frequency and Duration min 1 x/week  1 week       Prognosis Prognosis for Safe Diet Advancement: Fair Barriers to Reach Goals: Time post onset      Swallow Study   General Date of Onset: 09/24/18 HPI: 73 y.o. female with medical history significant of hypertension, hyperlipidemia, diabetes mellitus, COPD, GERD, gout, and anemia, atrial fibrillation and DVT on Eliquis,  ESRD-HD (TTS), lupus, PAD, R AKA (05/2018), now s/p L AKA Type of Study: Bedside Swallow Evaluation Diet Prior to this Study: Dysphagia 1 (puree);Thin liquids Temperature Spikes Noted: No Respiratory Status: Nasal cannula History of Recent Intubation: No Behavior/Cognition: Lethargic/Drowsy;Requires cueing;Cooperative Oral Cavity Assessment: Within Functional Limits Oral Care Completed by SLP: Yes Oral Cavity - Dentition: Dentures, top Self-Feeding Abilities: Needs assist Patient Positioning: Upright in bed Baseline Vocal Quality: Low vocal intensity Volitional Cough: Congested;Weak Volitional Swallow: Able to elicit    Oral/Motor/Sensory Function Overall Oral Motor/Sensory Function: Generalized oral weakness   Ice Chips Ice chips: Impaired Presentation: Spoon Oral Phase Impairments: Impaired mastication;Reduced lingual movement/coordination Pharyngeal Phase Impairments: Suspected delayed Swallow;Multiple swallows   Thin Liquid Thin Liquid: Impaired Presentation: Cup;Straw Oral Phase Impairments: Reduced labial seal;Reduced lingual movement/coordination Pharyngeal  Phase Impairments: Cough - Immediate;Multiple swallows;Decreased hyoid-laryngeal movement;Suspected delayed Swallow    Nectar Thick Nectar Thick Liquid: Not tested   Honey Thick Honey Thick Liquid: Not tested   Puree Puree: Impaired Presentation: Spoon Oral Phase Impairments: Reduced lingual movement/coordination Oral Phase Functional Implications: Prolonged oral transit Pharyngeal Phase Impairments: Suspected delayed Swallow;Multiple swallows   Solid     Solid: (pt  declined this date)      Arlinda Barcelona E Khylon Davies MA, CCC-SLP Acute Rehab Speech Language Pathology  09/29/2018,8:59 AM

## 2018-09-29 NOTE — Plan of Care (Signed)
  Problem: Education: Goal: Knowledge of General Education information will improve Description Including pain rating scale, medication(s)/side effects and non-pharmacologic comfort measures Outcome: Progressing   

## 2018-09-29 NOTE — NC FL2 (Signed)
[place] LEVEL OF CARE SCREENING TOOL     IDENTIFICATION  Patient Name: [name] Birthdate: [birth year] Sex: female Admission Date (Current Location): 09/24/2018  [facility] and [place] Number:  Herbalist and Address:  The . [facility], [place] [address], [place], [place] [address]      Provider Number: [id number]  Attending Physician Name and Address:  Samuella Cota, MD  Relative Name and Phone Number:  Izell Fillmore (spouse) [phone]    Current Level of Care: Hospital Recommended Level of Care: [place] Prior Approval Number:    Date Approved/Denied:   PASRR Number: [id number]  Discharge Plan: SNF    Current Diagnoses: Patient Active Problem List   Diagnosis Date Noted  . Acute on chronic diastolic CHF (congestive heart failure) ([place]) 09/28/2018  . S/P AKA (above knee amputation) bilateral ([place])   . Acute on chronic anemia   . Supplemental oxygen dependent   . Hypokalemia   . Hypertension   . GERD (gastroesophageal reflux disease) 09/24/2018  . Gout 09/24/2018  . Left foot infection 09/24/2018  . Pressure injury of skin 09/09/2018  . S/P AKA (above knee amputation) unilateral, right ([place])   . Status post amputation of left foot through metatarsal bone ([place])   . Acute blood loss anemia   . Orthostasis   . Diabetes mellitus type 2 in nonobese (HCC)   . PAF (paroxysmal atrial fibrillation) ([place])   . PAD (peripheral artery disease) ([place]) 09/06/2018  . History of trichomonal vaginitis 08/18/2018  . Hypoglycemia 07/29/2018  . Trichomonal vaginitis 07/12/2018  . Atrial flutter ([place])   . SVT (supraventricular tachycardia) ([place])   . ESRD on dialysis ([place])   . Steroid-induced hyperglycemia   . Type II diabetes mellitus with peripheral autonomic neuropathy (HCC)   . Anemia of chronic disease   . Prediabetes   . Postoperative pain   . Ischemic leg pain   . Unilateral AKA, right ([place])   .  Post-operative pain   . Diabetic retinopathy associated with type 2 diabetes mellitus ([place])   . Diabetic nephropathy associated with type 2 diabetes mellitus ([place])   . Leukocytosis   . Non-healing wound of lower extremity 06/08/2018  . Malnutrition of moderate degree 03/03/2018  . Leg wound, right 03/01/2018  . Ulcer of right calf ([place]) 02/10/2018  . Chronic RLQ pain 01/11/2018  . Ischemic ulcer of toes on both feet ([place])   . Cellulitis in diabetic foot ([place]) 12/14/2017  . Atrial fibrillation ([place]) 12/14/2017  . Glaucoma 12/14/2017  . ESRD (end stage renal disease) on dialysis ([place]) 11/16/2017  . COPD with acute exacerbation ([place]) 11/03/2017  . Community acquired pneumonia 11/02/2017  . Nausea and vomiting 11/02/2017  . Iron deficiency anemia 08/07/2017  . Heme positive stool 10/04/2015  . Mixed hyperlipidemia 06/29/2015  . Type 2 diabetes mellitus with stage 4 chronic kidney disease, with long-term current use of insulin ([place]) 06/29/2015  . Chronic kidney disease, stage V ([place]) 04/12/2014  . 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 Acute respiratory failure with hypoxia ([place]) 03/15/2014  . Chronic diastolic heart failure ([place]) 03/15/2014  . Anemia of chronic renal failure, stage 4 (severe) ([employer]) 02/23/2014  . Folate deficiency 02/17/2014  . Aortic regurgitation 09/19/2012  . Class 2 severe obesity due to excess calories with serious comorbidity and body mass index (BMI) of 35.0 to 35.9 in adult ([place]) 09/14/2012  . Type 2 diabetes mellitus with diabetic neuropathy (  [place]) 09/14/2012  . SLE (systemic lupus erythematosus) ([place]) 09/14/2012  . Essential hypertension, benign 09/14/2012  . COPD (chronic obstructive pulmonary disease) ([place]) 09/14/2012    Orientation RESPIRATION BLADDER Height & Weight     Self, Situation  O2(nasal cannula 2L) Continent Weight: 54.7 kg Height:  5' (152.4 cm)  BEHAVIORAL SYMPTOMS/MOOD NEUROLOGICAL  BOWEL NUTRITION STATUS      Incontinent Diet(dysphagia 1/please see DC summary)  AMBULATORY STATUS COMMUNICATION OF NEEDS Skin   Extensive Assist Verbally Surgical wounds, PU Stage and Appropriate Care(PU sacrum, PU II L buttock, L AKA)                       Personal Care Assistance Level of Assistance  Bathing, Feeding, Dressing Bathing Assistance: Maximum assistance Feeding assistance: Limited assistance Dressing Assistance: Maximum assistance     Functional Limitations Info  Sight, Hearing, Speech Sight Info: Adequate Hearing Info: Adequate Speech Info: Adequate    SPECIAL CARE FACTORS FREQUENCY  PT (By licensed PT), OT (By licensed OT)     PT Frequency: 5x/week OT Frequency: 5x/week            Contractures Contractures Info: Not present    Additional Factors Info  Code Status, Allergies Code Status Info: Full Allergies Info: Ace Inhibitors           Current Medications (09/29/2018):  This is the current hospital active medication list Current Facility-Administered Medications  Medication Dose Route Frequency Provider Last Rate Last Dose  . 0.9 %  sodium chloride infusion  250 mL Intravenous PRN Early, Arvilla Meres, MD      . acetaminophen (TYLENOL) tablet 650 mg  650 mg Oral Q6H PRN Ivor Costa, MD   650 mg at 09/29/18 1127   Or  . acetaminophen (TYLENOL) suppository 650 mg  650 mg Rectal Q6H PRN Ivor Costa, MD      . albuterol (PROVENTIL) (2.5 MG/3ML) 0.083% nebulizer solution 2.5 mg  2.5 mg Nebulization Q4H PRN Ivor Costa, MD   2.5 mg at 09/29/18 4580  . allopurinol (ZYLOPRIM) tablet 100 mg  100 mg Oral Q M,W,F Ivor Costa, MD   100 mg at 09/29/18 1043  . amiodarone (PACERONE) tablet 200 mg  200 mg Oral Daily Ivor Costa, MD   200 mg at 09/29/18 1045  . apixaban (ELIQUIS) tablet 2.5 mg  2.5 mg Oral BID Bonnielee Haff, MD   2.5 mg at 09/29/18 1043  . atorvastatin (LIPITOR) tablet 20 mg  20 mg Oral QHS Ivor Costa, MD   20 mg at 09/28/18 2256  . brimonidine  (ALPHAGAN) 0.2 % ophthalmic solution 1 drop  1 drop Left Eye BID Ivor Costa, MD   1 drop at 09/29/18 1047   And  . timolol (TIMOPTIC) 0.5 % ophthalmic solution 1 drop  1 drop Left Eye BID Ivor Costa, MD   1 drop at 09/29/18 1046  . calcium acetate (PHOSLO) capsule 667 mg  667 mg Oral TID WC Ivor Costa, MD   667 mg at 09/29/18 1146  . carbamazepine (TEGRETOL XR) 12 hr tablet 100 mg  100 mg Oral BID Ivor Costa, MD   100 mg at 09/29/18 1043  . Chlorhexidine Gluconate Cloth 2 % PADS 6 each  6 each Topical Q0600 Alric Seton, PA-C   6 each at 09/29/18 [phone]  . clotrimazole (MYCELEX) troche 10 mg  10 mg Oral 5 X Daily Bonnielee Haff, MD   10 mg at 09/29/18 1044  . Darbepoetin Alfa (ARANESP) injection  200 mcg  200 mcg Intravenous Q Sat-HD Valentina Gu, NP   200 mcg at 09/25/18 1634  . docusate sodium (COLACE) capsule 100 mg  100 mg Oral BID Ivor Costa, MD   100 mg at 09/29/18 1044  . feeding supplement (PRO-STAT SUGAR FREE 64) liquid 30 mL  30 mL Oral BID Ivor Costa, MD   30 mL at 09/29/18 1044  . guaiFENesin (MUCINEX) 12 hr tablet 600 mg  600 mg Oral BID Bonnielee Haff, MD   600 mg at 09/29/18 1044  . hydrALAZINE (APRESOLINE) injection 5 mg  5 mg Intravenous Q2H PRN Ivor Costa, MD   5 mg at 09/29/18 2505  . hydroxychloroquine (PLAQUENIL) tablet 200 mg  200 mg Oral Daily Ivor Costa, MD   200 mg at 09/29/18 1043  . hydrOXYzine (ATARAX/VISTARIL) tablet 10 mg  10 mg Oral TID PRN Ivor Costa, MD      . ipratropium-albuterol (DUONEB) 0.5-2.5 (3) MG/3ML nebulizer solution 3 mL  3 mL Nebulization BID Bonnielee Haff, MD   3 mL at 09/29/18 0849  . labetalol (NORMODYNE,TRANDATE) injection 10 mg  10 mg Intravenous Q10 min PRN Early, Arvilla Meres, MD      . latanoprost (XALATAN) 0.005 % ophthalmic solution 1 drop  1 drop Both Eyes QHS Ivor Costa, MD   1 drop at 09/27/18 2215  . magnesium sulfate IVPB 2 g 50 mL  2 g Intravenous Daily PRN Early, Arvilla Meres, MD      . metoprolol tartrate (LOPRESSOR) injection 2-5 mg   2-5 mg Intravenous Q2H PRN Early, Arvilla Meres, MD      . metoprolol tartrate (LOPRESSOR) tablet 12.5 mg  12.5 mg Oral 2 times per day on Mon Wed Fri Niu, Xilin, MD   12.5 mg at 09/29/18 1047  . multivitamin (RENA-VIT) tablet 1 tablet  1 tablet Oral Daily Ivor Costa, MD   1 tablet at 09/29/18 1043  . ondansetron (ZOFRAN) injection 4 mg  4 mg Intravenous Q6H PRN Early, Arvilla Meres, MD      . oxyCODONE (Oxy IR/ROXICODONE) immediate release tablet 10 mg  10 mg Oral Q6H PRN Ivor Costa, MD   10 mg at 09/29/18 0342  . pantoprazole (PROTONIX) EC tablet 40 mg  40 mg Oral Daily Ivor Costa, MD   40 mg at 09/29/18 1043  . phenol (CHLORASEPTIC) mouth spray 1 spray  1 spray Mouth/Throat PRN Early, Arvilla Meres, MD      . potassium chloride SA (K-DUR,KLOR-CON) CR tablet 20-40 mEq  20-40 mEq Oral Daily PRN Early, Arvilla Meres, MD      . predniSONE (DELTASONE) tablet 10 mg  10 mg Oral Q breakfast Bonnielee Haff, MD   10 mg at 09/29/18 1043  . sodium chloride flush (NS) 0.9 % injection 3 mL  3 mL Intravenous Q12H Rosetta Posner, MD   3 mL at 09/29/18 1047  . sodium chloride flush (NS) 0.9 % injection 3 mL  3 mL Intravenous PRN Early, Arvilla Meres, MD      . Travoprost (BAK Free) (TRAVATAN) 0.004 % ophthalmic solution SOLN 1 drop  1 drop Both Eyes QHS Ivor Costa, MD   1 drop at 09/28/18 2255     Discharge Medications: Please see discharge summary for a list of discharge medications.  Relevant Imaging Results:  Relevant Lab Results:   Additional Information SSN: [id number]  Estanislado Emms, LCSW

## 2018-09-29 NOTE — Plan of Care (Signed)
  Problem: Education: Goal: Knowledge of General Education information will improve Description: Including pain rating scale, medication(s)/side effects and non-pharmacologic comfort measures Outcome: Progressing   Problem: Pain Managment: Goal: General experience of comfort will improve Outcome: Progressing   

## 2018-09-29 NOTE — Clinical Social Work Note (Signed)
Clinical Social Work Assessment  Patient Details  Name: [name] MRN: [id number] Date of Birth: [birth year]  Date of referral:  09/29/18               Reason for consult:  Discharge Planning, Facility Placement                Permission sought to share information with:  Facility Sport and exercise psychologist, Family Supports Permission granted to share information::  Yes, Verbal Permission Granted  Name::     Lucindy Borel  Agency::  SNFs  Relationship::  spouse  Contact Information:  [phone]  Housing/Transportation Living arrangements for the past 2 months:  Single Family Home Source of Information:  Spouse Patient Interpreter Needed:  None Criminal Activity/Legal Involvement Pertinent to Current Situation/Hospitalization:  No - Comment as needed Significant Relationships:  Spouse Lives with:  Spouse Do you feel safe going back to the place where you live?  Yes Need for family participation in patient care:  Yes (Comment)  Care giving concerns: Patient from home with spouse initially, but did have a recent SNF stay at [facility]. Patient goes to dialysis at [facility] in [place].   Social Worker assessment / plan: CSW met with patient and spouse, Izell [place], at bedside. Patient lethargic, greeted CSW but did not interact otherwise. Spouse concerned because she hadn't been able to eat since he arrived. Reported to RN also present.  CSW introduced self and role and discussed disposition planning. Updated spouse that CIR would not be able to accept patient due to not being able to tolerate CIR level therapy. Spouse was hopeful for CIR, but understanding of recommendation. He would like for her to go to SNF because he feels he cannot manage her care needs, especially now after amputation. Spouse agreeable to referral fax out.  CSW sent out initial SNF referrals to [place] area. Awaiting bed offers. Will provide bed offers and CMS SNF list when available. Patient  will need [facility] authorization prior to admitting to a facility. Facility to initiate auth when identified. CSW to follow and support.   Employment status:  Retired Research officer, political party) PT Recommendations:  [place] / Referral to community resources:  [place]  Patient/Family's Response to care: Spouse appreciative of care.  Patient/Family's Understanding of and Emotional Response to Diagnosis, Current Treatment, and Prognosis: Spouse with understanding of patient's conditions and care needs. Agreeable to SNF placement.  Emotional Assessment Appearance:  Appears stated age Attitude/Demeanor/Rapport:  Lethargic Affect (typically observed):  Unable to Assess Orientation:  Oriented to Self, Oriented to Situation Alcohol / Substance use:  Not Applicable Psych involvement (Current and /or in the community):  No (Comment)  Discharge Needs  Concerns to be addressed:  Discharge Planning Concerns, Care Coordination Readmission within the last 30 days:  Yes Current discharge risk:  Physical Impairment Barriers to Discharge:  Ship broker, Continued Medical Work up   [employer], [place] 09/29/2018, 12:01 PM

## 2018-09-29 NOTE — Progress Notes (Addendum)
PROGRESS NOTE  [name] YTK:[id number] DOB: 01-11-1946 DOA: 09/24/2018 PCP: Sharilyn Sites, MD  Brief History   73 year old woman PMH transmetatarsal amputation 12/9, presented to vascular surgery office with left foot pain and found to have infection.  Was admitted for further management, underwent left above-the-knee amputation 12/29.  A & P  Left foot wound infection, seen by vascular surgery, underwent left above-knee amputation 12/29.  Cultures negative.  WBC within normal limits.  Afebrile.  Antibiotics discontinued. --Dressing removed by vascular surgery today.  Staples in place, wound well approximated.  Acute hypoxic respiratory failure  -- Presumably secondary to COPD.  Minimal oxygen requirement.  Wean as tolerated.  COPD.  Mild background emphysema and interstitial lung disease noted on CT 2013. --Appears stable.  Continue bronchodilators, supplemental oxygen.  Wean as tolerated.  Right forearm twitching, etiology unclear, CT head unrevealing, Dr. [name] discussed with neurology, incidental findings on head CT felt to be noncontributory.  EEG nonspecific.  Serum ammonia was within normal limits. --Appears resolved.  End-stage renal disease --Continue hemodialysis per nephrology  Acute on chronic diastolic CHF --Management per nephrology with hemodialysis.  Dysphagia.  Continue dysphagia 1 diet thin liquids as per speech therapy evaluation 1/1.  Hypophosphatemia. --Replete.  Check phosphorus in a.m.  Anemia of CKD --Anemia stable.  Thrush --Continue current treatment.  Paroxysmal atrial fibrillation, CHADs score 6 --Appears stable.  Continue apixaban, amiodarone, metoprolol  Chronic right-sided abdominal pain --Stable.  Husband reports this has been present since 2018.  Follow-up with gastroenterologist Dr. [name] as an outpatient  SLE on prednisone at home --Continue prednisone, Plaquenil  Diabetes mellitus type 2 with peripheral neuropathy and  end-stae renal disease.  Hemoglobin A1c 4.5. --Sliding scale insulin as needed but blood sugars well except for one high.  Acute metabolic encephalopathy, resolved.  CT negative.   Patient has failure to thrive and is not progressing status post surgery.  Her prognosis is guarded and I suspect recovery from this surgery will be difficult for her.  We will check a urinalysis but there is no evidence to suggest infection.  I began discussion with the husband but insight appears to be limited.  Will be evaluated tomorrow and suggest palliative care involvement.  Hopefully patient will begin to eat and improve enough to transition to rehab.  DVT prophylaxis: apixaban Code Status: [place] Family Communication: husband at bedside Disposition Plan: pending    Murray Hodgkins, MD  [facility] Direct contact: [phone] --[place]ia [place]  --[email or web address]; password [email or web address]  7PM-7AM contact night coverage as above 09/29/2018, 5:05 PM  LOS: 5 days   Consultants:  Vascular sugery  Procedures:  EEG  IMPRESSION: This EEG is characterized by slowing which is consistent with normal drowse.  Can not rule out the possibility of slowing related to general cerebral disturbance such as a metabolic encephalopathy.  Clinical correlation recommended.  No epileptiform activity is noted.    Antimicrobials:    Interval history/Subjective: Feels okay today.  Very poor appetite, not eating well.  Husband at bedside.  Breathing okay.  Objective: Vitals:  Vitals:   09/29/18 0849 09/29/18 1042  BP:  (!) 135/40  Pulse:  97  Resp:  20  Temp:  (!) 100.6 F (38.1 C)  SpO2: 99% 99%    Exam: Constitutional:   . Appears calm, listless, possibly depressed, debilitated Eyes:  . pupils and irises appear normal ENMT:  . grossly normal hearing  Respiratory:  . CTA bilaterally, no w/r/r.  Less upper airway noise today;  all expiratory. Marland Kitchen Respiratory effort normal. Cardiovascular:  . RRR, no  m/r/g . No LE extremity edema   Abdomen:  . Soft, mild generalized tenderness Musculoskeletal:  . Grip strength 4/5 bilaterally . Bilateral AKA noted Psychiatric:  . Mental status o Mood appears depressed, affect flat  I have personally reviewed the following:   Data: . CBG generally well controlled although 339 this afternoon. . Potassium within normal limits.  Phosphorus 1.4.  Creatinine 3.09 (hemodialysis yesterday). . Urinalysis was equivocal  Scheduled Meds: . allopurinol  100 mg Oral Q M,W,F  . amiodarone  200 mg Oral Daily  . apixaban  2.5 mg Oral BID  . atorvastatin  20 mg Oral QHS  . brimonidine  1 drop Left Eye BID   And  . timolol  1 drop Left Eye BID  . calcium acetate  667 mg Oral TID WC  . carbamazepine  100 mg Oral BID  . Chlorhexidine Gluconate Cloth  6 each Topical Q0600  . clotrimazole  10 mg Oral 5 X Daily  . darbepoetin (ARANESP) injection - DIALYSIS  200 mcg Intravenous Q Sat-HD  . docusate sodium  100 mg Oral BID  . feeding supplement (PRO-STAT SUGAR FREE 64)  30 mL Oral BID  . guaiFENesin  600 mg Oral BID  . hydroxychloroquine  200 mg Oral Daily  . insulin aspart  0-5 Units Subcutaneous QHS  . insulin aspart  0-9 Units Subcutaneous TID WC  . ipratropium-albuterol  3 mL Nebulization BID  . latanoprost  1 drop Both Eyes QHS  . metoprolol tartrate  12.5 mg Oral 2 times per day on Mon Wed Fri  . multivitamin  1 tablet Oral Daily  . pantoprazole  40 mg Oral Daily  . predniSONE  10 mg Oral Q breakfast  . sodium chloride flush  3 mL Intravenous Q12H  . Travoprost (BAK Free)  1 drop Both Eyes QHS   Continuous Infusions: . sodium chloride    . potassium PHOSPHATE IVPB (in mmol)      Principal Problem:   Left foot infection Active Problems:   SLE (systemic lupus erythematosus) (HCC)   Essential hypertension, benign   COPD (chronic obstructive pulmonary disease) (HCC)   Chronic diastolic heart failure (HCC)   Mixed hyperlipidemia   Atrial  fibrillation (HCC)   Type II diabetes mellitus with peripheral autonomic neuropathy (HCC)   ESRD on dialysis (HCC)   S/P AKA (above knee amputation) unilateral, right (HCC)   Status post amputation of left foot through metatarsal bone (HCC)   GERD (gastroesophageal reflux disease)   Gout   S/P AKA (above knee amputation) bilateral (HCC)   Acute on chronic anemia   Supplemental oxygen dependent   Hypokalemia   Hypertension   Acute on chronic diastolic CHF (congestive heart failure) (HCC)   FTT (failure to thrive) in adult   LOS: 5 days

## 2018-09-30 DIAGNOSIS — B370 Candidal stomatitis: Secondary | ICD-10-CM

## 2018-09-30 DIAGNOSIS — R1310 Dysphagia, unspecified: Secondary | ICD-10-CM

## 2018-09-30 LAB — RENAL FUNCTION PANEL
Albumin: 1.5 g/dL — ABNORMAL LOW (ref 3.5–5.0)
Anion gap: 11 (ref 5–15)
BUN: 40 mg/dL — ABNORMAL HIGH (ref 8–23)
CO2: 26 mmol/L (ref 22–32)
CREATININE: 4.48 mg/dL — AB (ref 0.44–1.00)
Calcium: 7.9 mg/dL — ABNORMAL LOW (ref 8.9–10.3)
Chloride: 100 mmol/L (ref 98–111)
GFR calc Af Amer: 11 mL/min — ABNORMAL LOW (ref >60)
GFR calc non Af Amer: 9 mL/min — ABNORMAL LOW (ref >60)
Glucose, Bld: 237 mg/dL — ABNORMAL HIGH (ref 70–99)
Phosphorus: 4.8 mg/dL — ABNORMAL HIGH (ref 2.5–4.6)
Potassium: 3.9 mmol/L (ref 3.5–5.1)
Sodium: 137 mmol/L (ref 135–145)

## 2018-09-30 LAB — CBC
HCT: 27.6 % — ABNORMAL LOW (ref 36.0–46.0)
Hemoglobin: 8.1 g/dL — ABNORMAL LOW (ref 12.0–15.0)
MCH: 27.2 pg (ref 26.0–34.0)
MCHC: 29.3 g/dL — ABNORMAL LOW (ref 30.0–36.0)
MCV: 92.6 fL (ref 80.0–100.0)
Platelets: 296 10*3/uL (ref 150–400)
RBC: 2.98 MIL/uL — ABNORMAL LOW (ref 3.87–5.11)
RDW: 18.4 % — ABNORMAL HIGH (ref 11.5–15.5)
WBC: 16.3 10*3/uL — ABNORMAL HIGH (ref 4.0–10.5)
nRBC: 0 % (ref 0.0–0.2)

## 2018-09-30 LAB — GLUCOSE, CAPILLARY
GLUCOSE-CAPILLARY: 139 mg/dL — AB (ref 70–99)
GLUCOSE-CAPILLARY: 172 mg/dL — AB (ref 70–99)
Glucose-Capillary: 215 mg/dL — ABNORMAL HIGH (ref 70–99)

## 2018-09-30 MED ORDER — SERTRALINE HCL 50 MG PO TABS
50.0000 mg | ORAL_TABLET | Freq: Every day | ORAL | Status: DC
  Administered 2018-09-30: 50 mg via ORAL
  Filled 2018-09-30 (×2): qty 1

## 2018-09-30 MED ORDER — FLUCONAZOLE 200 MG PO TABS
200.0000 mg | ORAL_TABLET | Freq: Once | ORAL | Status: AC
  Administered 2018-09-30: 200 mg via ORAL
  Filled 2018-09-30: qty 1

## 2018-09-30 MED ORDER — FLUCONAZOLE 100 MG PO TABS: 100.0000 mg | ORAL_TABLET | ORAL | Status: DC

## 2018-09-30 MED ORDER — DRONABINOL 2.5 MG PO CAPS
2.5000 mg | ORAL_CAPSULE | Freq: Every day | ORAL | Status: DC
  Filled 2018-09-30: qty 1

## 2018-09-30 NOTE — Procedures (Signed)
I was present at this dialysis session. I have reviewed the session itself and made appropriate changes.   PT confused. Tol HD via [place].  AP ok.  Cont on THS schedule.    Filed Weights   09/28/18 0715 09/28/18 1054 09/30/18 0930  Weight: 56.6 kg 54.7 kg 52.4 kg    Recent Labs  Lab 09/30/18 0151  NA 137  K 3.9  CL 100  CO2 26  GLUCOSE 237*  BUN 40*  CREATININE 4.48*  CALCIUM 7.9*  PHOS 4.8*    Recent Labs  Lab 09/24/18 1749  09/27/18 0917 09/28/18 0328 09/30/18 1025  WBC 7.1   < > 8.7 10.4 16.3*  NEUTROABS 5.9  --   --   --   --   HGB 9.5*   < > 8.3* 8.8* 8.1*  HCT 33.2*   < > 28.4* 30.3* 27.6*  MCV 94.9   < > 92.8 93.2 92.6  PLT 192   < > 205 200 296   < > = values in this interval not displayed.    Scheduled Meds: . allopurinol  100 mg Oral Q M,W,F  . amiodarone  200 mg Oral Daily  . apixaban  2.5 mg Oral BID  . atorvastatin  20 mg Oral QHS  . brimonidine  1 drop Left Eye BID   And  . timolol  1 drop Left Eye BID  . calcium acetate  667 mg Oral TID WC  . carbamazepine  100 mg Oral BID  . Chlorhexidine Gluconate Cloth  6 each Topical Q0600  . clotrimazole  10 mg Oral 5 X Daily  . darbepoetin (ARANESP) injection - DIALYSIS  200 mcg Intravenous Q Sat-HD  . docusate sodium  100 mg Oral BID  . feeding supplement (PRO-STAT SUGAR FREE 64)  30 mL Oral BID  . guaiFENesin  600 mg Oral BID  . hydroxychloroquine  200 mg Oral Daily  . insulin aspart  0-5 Units Subcutaneous QHS  . insulin aspart  0-9 Units Subcutaneous TID WC  . ipratropium-albuterol  3 mL Nebulization BID  . latanoprost  1 drop Both Eyes QHS  . metoprolol tartrate  12.5 mg Oral 2 times per day on Mon Wed Fri  . multivitamin  1 tablet Oral Daily  . pantoprazole  40 mg Oral Daily  . predniSONE  10 mg Oral Q breakfast  . sodium chloride flush  3 mL Intravenous Q12H  . Travoprost (BAK Free)  1 drop Both Eyes QHS   Continuous Infusions: . sodium chloride     PRN Meds:.sodium chloride, acetaminophen  **OR** acetaminophen, albuterol, hydrALAZINE, hydrOXYzine, labetalol, metoprolol tartrate, ondansetron, oxyCODONE, phenol, potassium chloride, sodium chloride flush   Pearson Grippe  MD 09/30/2018, 11:27 AM

## 2018-09-30 NOTE — Progress Notes (Signed)
MEDICATION RELATED CONSULT NOTE - FOLLOW UP   Pharmacy Consult for Fluconazole Indication: Oral candidiasis   Assessment: 73 yo F with PMH of ESRD. Consulted for fluconazole dosing for oral candidiasis.   Plan:  Give fluconazole 200mg  PO x 1 today, then start fluconazole 100mg  PO after HD on TTS. Last dose to be given on 1/9  [name] 09/30/2018,2:40 PM

## 2018-09-30 NOTE — Progress Notes (Signed)
Physical Therapy Cancellation Note    09/30/18 1110  PT Visit Information  Last PT Received On 09/30/18  Reason Eval/Treat Not Completed Patient at procedure or test/unavailable. Pt in HD. PT will continue to follow acutely.    Earney Navy, PTA Acute Rehabilitation Services Pager: [phone] Office: [phone]

## 2018-09-30 NOTE — Progress Notes (Signed)
PROGRESS NOTE  MAVI UN RJJ:[id number] DOB: 1946-04-20 DOA: 09/24/2018 PCP: Sharilyn Sites, MD  Brief History   73 year old woman PMH transmetatarsal amputation 12/9, presented to vascular surgery office with left foot pain and found to have infection.  Was admitted for further management, underwent left above-the-knee amputation 12/29.  A & P  Left foot wound infection, seen by vascular surgery, underwent left above-knee amputation 12/29.  Cultures negative.  WBC within normal limits.  Afebrile.  Antibiotics discontinued. --Dressing removed by vascular surgery 1/1.  Staples in place, wound well approximated.  Follow-up with vascular surgery as an outpatient.  Adult failure to thrive.  Poor appetite, very limited oral intake.  Appears depressed.  Complicated by thrush. --Start systemic antifungal.  Start Zoloft.  Add appetite stimulant.  Oral candidiasis --Not improving with Mycelex.  Change to systemic Diflucan.  Acute hypoxic respiratory failure  --Suspect secondary to COPD.  Minimum oxygen requirement.  Try to wean today.    Chronic right-sided abdominal pain --Appears stable.  Exam benign.  BM yesterday.  Husband reported this has been present since 2018.  Follow-up with gastroenterologist Dr. [name] as an outpatient  COPD.  Mild background emphysema and interstitial lung disease noted on CT 2013. --COPD does appear to be stable without wheezing and there is fair air movement.  Continue bronchodilators, supplemental oxygen.  Wean as tolerated.  Right forearm twitching, etiology unclear, CT head unrevealing, Dr. [name] discussed with neurology, incidental findings on head CT felt to be noncontributory.  EEG nonspecific.  Serum ammonia was within normal limits. --No recurrence last 72 hours  End-stage renal disease --Continue hemodialysis per nephrology  Acute on chronic diastolic CHF --Continue management per nephrology with hemodialysis.  Dysphagia.  Continue  dysphagia 1 diet thin liquids as per speech therapy evaluation 1/1.  Anemia of CKD --Anemia remained stable  Paroxysmal atrial fibrillation, CHADs score 6 --Remains stable.  Continue apixaban, amiodarone and metoprolol.    SLE on prednisone at home --Continue prednisone, Plaquenil  Diabetes mellitus type 2 with peripheral neuropathy and end-stae renal disease.  Hemoglobin A1c 4.5. --Continue sliding scale insulin.  Overall blood sugars have been well controlled with a few highs..  Acute metabolic encephalopathy, resolved.  CT negative.   Long discussion with husband and patient at bedside.  Most concerned about failure to improve status post amputation, depressed mood, minimal oral intake, worsening generalized debility.  She has had no fever and there is no evidence to suggest infection.  Leukocytosis seen today could be result of recent initiation of prednisone.  Recent chest x-ray negative.  Urinalysis equivocal, culture pending, she does not have any symptoms.  Abdominal pain appears to be chronic and was imaged 12/28 and was unremarkable.  We will try an antidepressant, appetite stimulant and will consult palliative medicine to assist with goals of care and symptom management.  I did discuss with the husband and patient that I was concerned she would not recover.  DVT prophylaxis: apixaban Code Status: Fulll Family Communication: husband at bedside Disposition Plan: pending    Murray Hodgkins, MD  [facility] Direct contact: [phone] --[address]  --[email or web address]; password [email or web address]  7PM-7AM contact night coverage as above 09/30/2018, 3:20 PM  LOS: 6 days   Consultants:  Vascular sugery  Procedures:  EEG  IMPRESSION: This EEG is characterized by slowing which is consistent with normal drowse.  Can not rule out the possibility of slowing related to general cerebral disturbance such as a metabolic encephalopathy.  Clinical correlation recommended.  No  epileptiform activity is noted.    Antimicrobials:    Interval history/Subjective: "Help me get well".  Poor appetite.  Not eating.  No abdominal pain except with palpation.  Husband reports she is not eating.  Objective: Vitals:  Vitals:   09/30/18 1300 09/30/18 1315  BP: (!) 102/56 (!) 151/54  Pulse: 91 91  Resp: 16 17  Temp:  98.9 F (37.2 C)  SpO2:  100%    Exam: Constitutional:   . Appears calm, uncomfortable, nontoxic Eyes:  . pupils and irises appear normal ENMT:  . grossly normal hearing  . Lips appear normal . Tongue coated with thick white exudate Respiratory:  . CTA bilaterally, no w/r/r.  Less upper airway noise today. Marland Kitchen Respiratory effort normal.  Cardiovascular:  . RRR, no m/r/g . Bilateral AKA Abdomen:  . Abdomen appears unremarkable.  Soft, mild lower abdominal tenderness on the right. Musculoskeletal:  . Upper extremity strength appears grossly unremarkable Neurologic:  . Grossly nonfocal Psychiatric:  . Mental status o Mood appears depressed.  Affect is odd. o Oriented to location.  Does not answer all questions. . judgment and insight difficult to gauge. . Sitting up in bed, appears to be selecting to respond to some questions and ignoring others.   I have personally reviewed the following:   Data: . CBG high yesterday afternoon and evening but stable this morning at 139. . WBC 16.3, hemoglobin 8.1, platelets within normal limits. Marland Kitchen BMP reviewed.  Potassium within normal limits.  Scheduled Meds: . allopurinol  100 mg Oral Q M,W,F  . amiodarone  200 mg Oral Daily  . apixaban  2.5 mg Oral BID  . atorvastatin  20 mg Oral QHS  . brimonidine  1 drop Left Eye BID   And  . timolol  1 drop Left Eye BID  . calcium acetate  667 mg Oral TID WC  . carbamazepine  100 mg Oral BID  . Chlorhexidine Gluconate Cloth  6 each Topical Q0600  . darbepoetin (ARANESP) injection - DIALYSIS  200 mcg Intravenous Q Sat-HD  . docusate sodium  100 mg Oral  BID  . [START ON 10/01/2018] dronabinol  2.5 mg Oral QAC lunch  . feeding supplement (PRO-STAT SUGAR FREE 64)  30 mL Oral BID  . [START ON 10/01/2018] fluconazole  100 mg Oral Once per day on Tue Thu Sat  . fluconazole  200 mg Oral Once  . guaiFENesin  600 mg Oral BID  . hydroxychloroquine  200 mg Oral Daily  . insulin aspart  0-5 Units Subcutaneous QHS  . insulin aspart  0-9 Units Subcutaneous TID WC  . ipratropium-albuterol  3 mL Nebulization BID  . latanoprost  1 drop Both Eyes QHS  . metoprolol tartrate  12.5 mg Oral 2 times per day on Mon Wed Fri  . multivitamin  1 tablet Oral Daily  . pantoprazole  40 mg Oral Daily  . predniSONE  10 mg Oral Q breakfast  . sertraline  50 mg Oral Daily  . sodium chloride flush  3 mL Intravenous Q12H   Continuous Infusions: . sodium chloride      Principal Problem:   Left foot infection Active Problems:   SLE (systemic lupus erythematosus) (HCC)   Essential hypertension, benign   COPD (chronic obstructive pulmonary disease) (HCC)   Chronic diastolic heart failure (HCC)   Mixed hyperlipidemia   Atrial fibrillation (HCC)   Type II diabetes mellitus with peripheral autonomic neuropathy ([place])   ESRD on dialysis ([place])  S/P AKA (above knee amputation) unilateral, right (HCC)   Status post amputation of left foot through metatarsal bone (HCC)   GERD (gastroesophageal reflux disease)   Gout   S/P AKA (above knee amputation) bilateral (HCC)   Acute on chronic anemia   Supplemental oxygen dependent   Hypertension   Acute on chronic diastolic CHF (congestive heart failure) (HCC)   FTT (failure to thrive) in adult   Oral candidiasis   Dysphagia   LOS: 6 days

## 2018-10-01 ENCOUNTER — Inpatient Hospital Stay (HOSPITAL_COMMUNITY): Payer: Medicare Other

## 2018-10-01 DIAGNOSIS — I5033 Acute on chronic diastolic (congestive) heart failure: Secondary | ICD-10-CM

## 2018-10-01 DIAGNOSIS — J9601 Acute respiratory failure with hypoxia: Secondary | ICD-10-CM

## 2018-10-01 DIAGNOSIS — G9341 Metabolic encephalopathy: Secondary | ICD-10-CM

## 2018-10-01 DIAGNOSIS — Z515 Encounter for palliative care: Secondary | ICD-10-CM

## 2018-10-01 DIAGNOSIS — L089 Local infection of the skin and subcutaneous tissue, unspecified: Secondary | ICD-10-CM

## 2018-10-01 DIAGNOSIS — Z89612 Acquired absence of left leg above knee: Secondary | ICD-10-CM

## 2018-10-01 DIAGNOSIS — Z7189 Other specified counseling: Secondary | ICD-10-CM

## 2018-10-01 LAB — CBC
HCT: 32.4 % — ABNORMAL LOW (ref 36.0–46.0)
HEMOGLOBIN: 9.7 g/dL — AB (ref 12.0–15.0)
MCH: 27.2 pg (ref 26.0–34.0)
MCHC: 29.9 g/dL — ABNORMAL LOW (ref 30.0–36.0)
MCV: 91 fL (ref 80.0–100.0)
NRBC: 0 % (ref 0.0–0.2)
Platelets: 299 10*3/uL (ref 150–400)
RBC: 3.56 MIL/uL — ABNORMAL LOW (ref 3.87–5.11)
RDW: 18.4 % — ABNORMAL HIGH (ref 11.5–15.5)
WBC: 18.6 10*3/uL — ABNORMAL HIGH (ref 4.0–10.5)

## 2018-10-01 LAB — POCT I-STAT 3, ART BLOOD GAS (G3+)
Acid-Base Excess: 2 mmol/L (ref 0.0–2.0)
Bicarbonate: 27.5 mmol/L (ref 20.0–28.0)
O2 Saturation: 99 %
Patient temperature: 99.1
TCO2: 29 mmol/L (ref 22–32)
pCO2 arterial: 48 mmHg (ref 32.0–48.0)
pH, Arterial: 7.367 (ref 7.350–7.450)
pO2, Arterial: 158 mmHg — ABNORMAL HIGH (ref 83.0–108.0)

## 2018-10-01 LAB — BLOOD GAS, ARTERIAL
Acid-Base Excess: 0.8 mmol/L (ref 0.0–2.0)
Bicarbonate: 24.9 mmol/L (ref 20.0–28.0)
Drawn by: 12971
O2 Content: 2 L/min
O2 Saturation: 91.4 %
Patient temperature: 98.6
pCO2 arterial: 39.8 mmHg (ref 32.0–48.0)
pH, Arterial: 7.412 (ref 7.350–7.450)
pO2, Arterial: 62.9 mmHg — ABNORMAL LOW (ref 83.0–108.0)

## 2018-10-01 LAB — RENAL FUNCTION PANEL
ANION GAP: 14 (ref 5–15)
Albumin: 1.6 g/dL — ABNORMAL LOW (ref 3.5–5.0)
BUN: 22 mg/dL (ref 8–23)
CO2: 26 mmol/L (ref 22–32)
Calcium: 8.3 mg/dL — ABNORMAL LOW (ref 8.9–10.3)
Chloride: 100 mmol/L (ref 98–111)
Creatinine, Ser: 3.07 mg/dL — ABNORMAL HIGH (ref 0.44–1.00)
GFR calc Af Amer: 17 mL/min — ABNORMAL LOW (ref >60)
GFR calc non Af Amer: 14 mL/min — ABNORMAL LOW (ref >60)
Glucose, Bld: 169 mg/dL — ABNORMAL HIGH (ref 70–99)
POTASSIUM: 4.9 mmol/L (ref 3.5–5.1)
Phosphorus: 3.4 mg/dL (ref 2.5–4.6)
Sodium: 140 mmol/L (ref 135–145)

## 2018-10-01 LAB — GLUCOSE, CAPILLARY
Glucose-Capillary: 145 mg/dL — ABNORMAL HIGH (ref 70–99)
Glucose-Capillary: 175 mg/dL — ABNORMAL HIGH (ref 70–99)
Glucose-Capillary: 209 mg/dL — ABNORMAL HIGH (ref 70–99)
Glucose-Capillary: 223 mg/dL — ABNORMAL HIGH (ref 70–99)

## 2018-10-01 LAB — PROTIME-INR
INR: 1.45
Prothrombin Time: 17.5 seconds — ABNORMAL HIGH (ref 11.4–15.2)

## 2018-10-01 LAB — URINE CULTURE

## 2018-10-01 LAB — LACTIC ACID, PLASMA: Lactic Acid, Venous: 1.7 mmol/L (ref 0.5–1.9)

## 2018-10-01 LAB — APTT: aPTT: 45 seconds — ABNORMAL HIGH (ref 24–36)

## 2018-10-01 MED ORDER — OXYCODONE HCL 20 MG/ML PO CONC: 10.0000 mg | ORAL | Status: DC | PRN

## 2018-10-01 MED ORDER — SODIUM CHLORIDE 0.9 % IV SOLN
2.0000 g | INTRAVENOUS | Status: DC
  Administered 2018-10-02: 2 g via INTRAVENOUS
  Filled 2018-10-01: qty 2

## 2018-10-01 MED ORDER — FENTANYL 2500MCG IN NS 250ML (10MCG/ML) PREMIX INFUSION
0.0000 ug/h | INTRAVENOUS | Status: DC
  Administered 2018-10-01: 25 ug/h via INTRAVENOUS
  Filled 2018-10-01: qty 250

## 2018-10-01 MED ORDER — ACETAMINOPHEN 10 MG/ML IV SOLN
1000.0000 mg | Freq: Three times a day (TID) | INTRAVENOUS | Status: AC | PRN
  Administered 2018-10-01: 1000 mg via INTRAVENOUS
  Filled 2018-10-01: qty 100

## 2018-10-01 MED ORDER — MIDAZOLAM HCL 2 MG/2ML IJ SOLN
1.0000 mg | INTRAMUSCULAR | Status: DC | PRN
  Filled 2018-10-01: qty 2

## 2018-10-01 MED ORDER — FENTANYL CITRATE (PF) 100 MCG/2ML IJ SOLN
50.0000 ug | INTRAMUSCULAR | Status: DC | PRN
  Administered 2018-10-01 – 2018-10-11 (×12): 50 ug via INTRAVENOUS
  Filled 2018-10-01 (×13): qty 2

## 2018-10-01 MED ORDER — OXYCODONE HCL 5 MG/5ML PO SOLN: 10.0000 mg | ORAL | Status: DC | PRN

## 2018-10-01 MED ORDER — FENTANYL BOLUS VIA INFUSION
25.0000 ug | INTRAVENOUS | Status: DC | PRN
  Filled 2018-10-01: qty 25

## 2018-10-01 MED ORDER — PANTOPRAZOLE SODIUM 40 MG IV SOLR: 40.0000 mg | INTRAVENOUS | Status: DC

## 2018-10-01 MED ORDER — APIXABAN 2.5 MG PO TABS
2.5000 mg | ORAL_TABLET | Freq: Two times a day (BID) | ORAL | Status: DC
  Administered 2018-10-01 – 2018-10-02 (×2): 2.5 mg
  Filled 2018-10-01 (×2): qty 1

## 2018-10-01 MED ORDER — PANTOPRAZOLE SODIUM 40 MG IV SOLR
40.0000 mg | INTRAVENOUS | Status: DC
  Administered 2018-10-01: 40 mg via INTRAVENOUS
  Filled 2018-10-01: qty 40

## 2018-10-01 MED ORDER — INSULIN ASPART 100 UNIT/ML ~~LOC~~ SOLN
0.0000 [IU] | SUBCUTANEOUS | Status: DC
  Administered 2018-10-01: 3 [IU] via SUBCUTANEOUS
  Administered 2018-10-02 (×3): 1 [IU] via SUBCUTANEOUS
  Administered 2018-10-03 (×2): 2 [IU] via SUBCUTANEOUS
  Administered 2018-10-03: 1 [IU] via SUBCUTANEOUS
  Administered 2018-10-03: 2 [IU] via SUBCUTANEOUS
  Administered 2018-10-03 (×2): 1 [IU] via SUBCUTANEOUS
  Administered 2018-10-04 (×2): 2 [IU] via SUBCUTANEOUS
  Administered 2018-10-04: 1 [IU] via SUBCUTANEOUS
  Administered 2018-10-04: 2 [IU] via SUBCUTANEOUS
  Administered 2018-10-04: 1 [IU] via SUBCUTANEOUS
  Administered 2018-10-05: 3 [IU] via SUBCUTANEOUS
  Administered 2018-10-05: 2 [IU] via SUBCUTANEOUS
  Administered 2018-10-05 (×2): 3 [IU] via SUBCUTANEOUS
  Administered 2018-10-05: 2 [IU] via SUBCUTANEOUS
  Administered 2018-10-06: 5 [IU] via SUBCUTANEOUS
  Administered 2018-10-06: 9 [IU] via SUBCUTANEOUS
  Administered 2018-10-06: 7 [IU] via SUBCUTANEOUS
  Administered 2018-10-06: 5 [IU] via SUBCUTANEOUS
  Administered 2018-10-06: 9 [IU] via SUBCUTANEOUS
  Administered 2018-10-06: 5 [IU] via SUBCUTANEOUS
  Administered 2018-10-07: 1 [IU] via SUBCUTANEOUS
  Administered 2018-10-07: 3 [IU] via SUBCUTANEOUS
  Administered 2018-10-07: 1 [IU] via SUBCUTANEOUS
  Administered 2018-10-07: 7 [IU] via SUBCUTANEOUS
  Administered 2018-10-07: 3 [IU] via SUBCUTANEOUS
  Administered 2018-10-07: 5 [IU] via SUBCUTANEOUS
  Administered 2018-10-07: 3 [IU] via SUBCUTANEOUS
  Administered 2018-10-08: 2 [IU] via SUBCUTANEOUS
  Administered 2018-10-08: 3 [IU] via SUBCUTANEOUS
  Administered 2018-10-08: 2 [IU] via SUBCUTANEOUS
  Administered 2018-10-08: 1 [IU] via SUBCUTANEOUS
  Administered 2018-10-08: 2 [IU] via SUBCUTANEOUS
  Administered 2018-10-09: 3 [IU] via SUBCUTANEOUS
  Administered 2018-10-09: 2 [IU] via SUBCUTANEOUS
  Administered 2018-10-09 (×2): 1 [IU] via SUBCUTANEOUS
  Administered 2018-10-10: 2 [IU] via SUBCUTANEOUS
  Administered 2018-10-10: 3 [IU] via SUBCUTANEOUS
  Administered 2018-10-10 (×2): 2 [IU] via SUBCUTANEOUS
  Administered 2018-10-10: 1 [IU] via SUBCUTANEOUS
  Administered 2018-10-11: 2 [IU] via SUBCUTANEOUS
  Administered 2018-10-11: 1 [IU] via SUBCUTANEOUS
  Administered 2018-10-11 (×2): 2 [IU] via SUBCUTANEOUS
  Administered 2018-10-12: 1 [IU] via SUBCUTANEOUS
  Administered 2018-10-12: 3 [IU] via SUBCUTANEOUS
  Administered 2018-10-12 – 2018-10-13 (×5): 2 [IU] via SUBCUTANEOUS
  Administered 2018-10-13 (×2): 3 [IU] via SUBCUTANEOUS
  Administered 2018-10-14: 5 [IU] via SUBCUTANEOUS
  Administered 2018-10-14: 3 [IU] via SUBCUTANEOUS
  Administered 2018-10-14 (×2): 1 [IU] via SUBCUTANEOUS
  Administered 2018-10-14: 3 [IU] via SUBCUTANEOUS
  Administered 2018-10-14: 7 [IU] via SUBCUTANEOUS
  Administered 2018-10-15 (×2): 2 [IU] via SUBCUTANEOUS
  Administered 2018-10-15 (×3): 3 [IU] via SUBCUTANEOUS
  Administered 2018-10-16 (×4): 2 [IU] via SUBCUTANEOUS
  Administered 2018-10-16 – 2018-10-17 (×3): 3 [IU] via SUBCUTANEOUS
  Administered 2018-10-17: 2 [IU] via SUBCUTANEOUS
  Administered 2018-10-17 – 2018-10-18 (×2): 3 [IU] via SUBCUTANEOUS
  Administered 2018-10-18 (×2): 1 [IU] via SUBCUTANEOUS
  Administered 2018-10-18: 2 [IU] via SUBCUTANEOUS
  Administered 2018-10-18: 3 [IU] via SUBCUTANEOUS
  Administered 2018-10-19: 2 [IU] via SUBCUTANEOUS
  Administered 2018-10-19: 1 [IU] via SUBCUTANEOUS
  Administered 2018-10-19: 3 [IU] via SUBCUTANEOUS
  Administered 2018-10-19: 1 [IU] via SUBCUTANEOUS
  Administered 2018-10-20: 2 [IU] via SUBCUTANEOUS
  Administered 2018-10-20: 5 [IU] via SUBCUTANEOUS
  Administered 2018-10-20 (×3): 3 [IU] via SUBCUTANEOUS
  Administered 2018-10-21: 5 [IU] via SUBCUTANEOUS
  Administered 2018-10-21: 7 [IU] via SUBCUTANEOUS

## 2018-10-01 MED ORDER — VANCOMYCIN HCL 500 MG IV SOLR
500.0000 mg | INTRAVENOUS | Status: DC
  Administered 2018-10-02: 500 mg via INTRAVENOUS
  Filled 2018-10-01 (×2): qty 500

## 2018-10-01 MED ORDER — PHENYLEPHRINE HCL-NACL 10-0.9 MG/250ML-% IV SOLN
0.0000 ug/min | INTRAVENOUS | Status: DC
  Filled 2018-10-01: qty 250

## 2018-10-01 MED ORDER — ORAL CARE MOUTH RINSE
15.0000 mL | OROMUCOSAL | Status: DC
  Administered 2018-10-01 – 2018-10-11 (×96): 15 mL via OROMUCOSAL

## 2018-10-01 MED ORDER — MIDAZOLAM HCL 2 MG/2ML IJ SOLN
1.0000 mg | INTRAMUSCULAR | Status: DC | PRN
  Administered 2018-10-08 – 2018-10-09 (×3): 1 mg via INTRAVENOUS
  Filled 2018-10-01 (×3): qty 2

## 2018-10-01 MED ORDER — CHLORHEXIDINE GLUCONATE 0.12% ORAL RINSE (MEDLINE KIT)
15.0000 mL | Freq: Two times a day (BID) | OROMUCOSAL | Status: DC
  Administered 2018-10-01 – 2018-10-11 (×20): 15 mL via OROMUCOSAL

## 2018-10-01 MED ORDER — FENTANYL CITRATE (PF) 100 MCG/2ML IJ SOLN
50.0000 ug | Freq: Once | INTRAMUSCULAR | Status: AC
  Administered 2018-10-01: 50 ug via INTRAVENOUS

## 2018-10-01 MED ORDER — ETOMIDATE 2 MG/ML IV SOLN
20.0000 mg | Freq: Once | INTRAVENOUS | Status: AC
  Administered 2018-10-01: 20 mg via INTRAVENOUS

## 2018-10-01 MED ORDER — AMIODARONE HCL 200 MG PO TABS
200.0000 mg | ORAL_TABLET | Freq: Every day | ORAL | Status: DC
  Administered 2018-10-02 – 2018-10-23 (×21): 200 mg
  Filled 2018-10-01 (×23): qty 1

## 2018-10-01 MED ORDER — FLUCONAZOLE 100MG IVPB
100.0000 mg | INTRAVENOUS | Status: DC
  Administered 2018-10-02: 100 mg via INTRAVENOUS
  Filled 2018-10-01: qty 50

## 2018-10-01 MED ORDER — HYDROCORTISONE NA SUCCINATE PF 100 MG IJ SOLR
50.0000 mg | Freq: Four times a day (QID) | INTRAMUSCULAR | Status: DC
  Administered 2018-10-01 – 2018-10-04 (×11): 50 mg via INTRAVENOUS
  Filled 2018-10-01: qty 2
  Filled 2018-10-01: qty 1
  Filled 2018-10-01 (×2): qty 2
  Filled 2018-10-01: qty 1
  Filled 2018-10-01 (×7): qty 2

## 2018-10-01 MED ORDER — CARBAMAZEPINE 100 MG/5ML PO SUSP
100.0000 mg | Freq: Two times a day (BID) | ORAL | Status: DC
  Administered 2018-10-01 – 2018-10-03 (×4): 100 mg
  Filled 2018-10-01 (×4): qty 5

## 2018-10-01 MED ORDER — VANCOMYCIN HCL IN DEXTROSE 1-5 GM/200ML-% IV SOLN
1000.0000 mg | Freq: Once | INTRAVENOUS | Status: AC
  Administered 2018-10-01: 1000 mg via INTRAVENOUS
  Filled 2018-10-01: qty 200

## 2018-10-01 MED ORDER — DOCUSATE SODIUM 50 MG/5ML PO LIQD
100.0000 mg | Freq: Two times a day (BID) | ORAL | Status: DC | PRN
  Filled 2018-10-01: qty 10

## 2018-10-01 MED ORDER — METRONIDAZOLE IN NACL 5-0.79 MG/ML-% IV SOLN
500.0000 mg | Freq: Three times a day (TID) | INTRAVENOUS | Status: DC
  Administered 2018-10-01 – 2018-10-03 (×6): 500 mg via INTRAVENOUS
  Filled 2018-10-01 (×7): qty 100

## 2018-10-01 MED ORDER — MIDAZOLAM HCL 2 MG/2ML IJ SOLN
1.0000 mg | INTRAMUSCULAR | Status: AC | PRN
  Administered 2018-10-01 – 2018-10-08 (×3): 1 mg via INTRAVENOUS
  Filled 2018-10-01: qty 2

## 2018-10-01 MED ORDER — LACTATED RINGERS IV BOLUS
500.0000 mL | Freq: Once | INTRAVENOUS | Status: AC
  Administered 2018-10-01: 500 mL via INTRAVENOUS

## 2018-10-01 MED ORDER — SODIUM CHLORIDE 0.9 % IV SOLN
2.0000 g | Freq: Once | INTRAVENOUS | Status: AC
  Administered 2018-10-01: 2 g via INTRAVENOUS
  Filled 2018-10-01: qty 2

## 2018-10-01 MED ORDER — GLYCOPYRROLATE 0.2 MG/ML IJ SOLN
0.2000 mg | Freq: Four times a day (QID) | INTRAMUSCULAR | Status: DC | PRN
  Filled 2018-10-01: qty 1

## 2018-10-01 NOTE — Care Management Important Message (Signed)
Important Message  Patient Details  Name: [name] MRN: [id number] Date of Birth: April 20, 1946   Medicare Important Message Given:  Yes    Orbie Pyo 10/01/2018, 3:16 PM

## 2018-10-01 NOTE — Progress Notes (Signed)
CCM at bedside when patient arrived to unit. Patient intubated around 1730 with RT, Bowser NP, and Simpson NP. Patient pre-medicated with 63mcg fentanyl, 1mg  versed, 20mg  etomidate. An additional 7mcg fentanyl and 1mg  versed given during intubation per CCM.

## 2018-10-01 NOTE — Progress Notes (Signed)
MD paged as pt fever up to 101.5. Pt not on telemetry, HR getting higher, pt looking worse. Pt not able to take any of her PO meds,  including her Amiodarone and Eliquis. Pt stating in a light whisper,  "Please help me, please help me". See new orders.   Lucius Conn, RN

## 2018-10-01 NOTE — Procedures (Signed)
Intubation Procedure Note ULLA MCKIERNAN [id number] [birth year]  Procedure: Intubation Indications: Airway protection and maintenance  Procedure Details Consent: Risks of procedure as well as the alternatives and risks of each were explained to the (patient/caregiver).  Consent for procedure obtained. Time Out: Verified patient identification, verified procedure, site/side was marked, verified correct patient position, special equipment/implants available, medications/allergies/relevent history reviewed, required imaging and test results available.  Performed  Pre-sedated with 21mcg Fentanyl, 1mg  Versed, 20 mg Etomidate  Maximum sterile technique was used including gloves, gown and hand hygiene.  3 Glidescope  Grade 1 view   7.5 ETT placed at 22cm, secured   OG tube placed via glidescope following endotracheal intubation   Evaluation Hemodynamic Status: Transient hypotension treated with pressors; O2 sats: stable throughout Patient's Current Condition: stable Complications: No apparent complications Patient did tolerate procedure well. Chest X-ray ordered to verify placement.  CXR: pending.    Eliseo Gum MSN, AGACNP-BC [facility] 10/01/2018, 5:39 PM

## 2018-10-01 NOTE — Progress Notes (Addendum)
PROGRESS NOTE  [name]:[id number] DOB: 06-27-1946 DOA: 09/24/2018 PCP: Sharilyn Sites, MD  Brief History   73 year old woman PMH transmetatarsal amputation 12/9, presented to vascular surgery office with left foot pain and found to have infection.  Was admitted for further management, underwent left above-the-knee amputation 12/29.  Postoperatively she has had significant failure to thrive, poor appetite and very limited intake.  Palliative medicine has been consulted.  Prognosis guarded, if fails to improve over the next few days, hospice would be appropriate.  A & P  Left foot wound infection, seen by vascular surgery, underwent left above-knee amputation 12/29.  Cultures negative. Afebrile.  Antibiotics discontinued. --Dressing removed by vascular surgery 1/1.  Staples in place, wound well approximated.  Follow-up with vascular surgery as an outpatient. --Appears stable.  Adult failure to thrive.  Complicated by oral candidiasis. --Poor appetite, limited intake. --Zoloft and appetite stimulant started --Continue to treat oral candidiasis --Palliative medicine consulted for assistance with goals of care and symptom management  Oral candidiasis.  Failed to improve with Mycelex. --Improving on oral Diflucan.  Continue treatment.  Acute hypoxic respiratory failure  --Minimal oxygen requirement.  Suspect secondary to underlying COPD.  Continues to have intermittent upper airway noise.  Lungs remain clear on exam.  Chronic right-sided abdominal pain --Appears stable.  Bowels moving.  Exam benign.  Husband reports this has been present since 2018 and the patient has follow-up with  gastroenterologist Dr. [name] as an outpatient CT abdomen and pelvis October 2019 suggested the possibility of colitis and however renal mass suspicious for renal cell carcinoma.  Abdominal x-ray 12/28 was unremarkable.  COPD.  Mild background emphysema and interstitial lung disease noted on CT  2013. --COPD appears stable, she has significant upper airway noise at times.  Continue supplemental oxygen and bronchodilators.  Right forearm twitching, etiology unclear, CT head unrevealing, Dr. [name] discussed with neurology, incidental findings on head CT felt to be noncontributory.  EEG nonspecific.  Serum ammonia was within normal limits. --No recurrence last 96 hours  End-stage renal disease --Continue hemodialysis per nephrology  Acute on chronic diastolic CHF --Continue management per nephrology with hemodialysis.  Dysphagia.  Continue dysphagia 1 diet thin liquids as per speech therapy evaluation 1/1.  Anemia of CKD --Stable.  Paroxysmal atrial fibrillation, CHADs score 6 --Stable.  Continue apixaban, amiodarone and metoprolol  SLE on prednisone at home --Continue prednisone, Plaquenil  Diabetes mellitus type 2 with peripheral neuropathy and end-stae renal disease.  Hemoglobin A1c 4.5. --CBG labile.  Continue sliding scale insulin given poor oral intake.  Acute metabolic encephalopathy, resolved.  CT negative.   Failure to thrive is highly concerning.  She has no fever and hemodynamics are stable.  She participates minimally in interview.  Appetite and intake remains poor.  Leukocytosis noted may be secondary to prednisone recently started.  She has no new findings on examination but given difficulty obtaining history and failure to improve we will repeat chest x-ray and abdominal imaging.  Urinalysis was equivocal and urine culture was unrevealing.  Addendum.  Called to see patient approximately 1434 lethargy, shortness of breath and worsening condition.  Fever today.  Arrived to find the patient lethargic, tachypneic, dyspneic, audible upper airway noise.  On exam appeared very weak.  Lungs generally clear with significant upper airway noise.  Patient having difficulty focusing and answering questions.  ABG showed hypoxemia otherwise was unremarkable.  Chest x-ray and  abdominal x-ray unremarkable.  Lactic acid within normal limits.  Antibiotics initiated.  Etiology  of decline unclear.  Sepsis work-up unremarkable.  She appears critically ill and at risk for intubation.  Discussed the case with critical care at the bedside, doubt patient could handle BiPAP given inability to handle secretions, decision was made to transfer to ICU proceed with intubation.  Greatly appreciate critical care.  Discussed with husband and other family members at bedside.  DVT prophylaxis: apixaban Code Status: Fulll Family Communication: husband at bedside Disposition Plan: pending    Murray Hodgkins, MD  [facility] Direct contact: [phone] --[address]ia [place]  --[email or web address]; password [email or web address]  7PM-7AM contact night coverage as above 10/01/2018, 10:00 AM  LOS: 7 days   Consultants:  Vascular sugery  Procedures:  EEG  IMPRESSION: This EEG is characterized by slowing which is consistent with normal drowse.  Can not rule out the possibility of slowing related to general cerebral disturbance such as a metabolic encephalopathy.  Clinical correlation recommended.  No epileptiform activity is noted.    Antimicrobials:    Interval history/Subjective: Feels okay today.  Breathing okay.  Abdomen feels okay.  Objective: Vitals:  Vitals:   10/01/18 0424 10/01/18 0934  BP: (!) 140/51   Pulse: 96 (!) 101  Resp: 14 20  Temp: 99.2 F (37.3 C)   SpO2: 96% 95%    Exam: Constitutional:   . Appears calm, debilitated, ill, very weak Eyes:  . pupils and irises appear normal ENMT:  . grossly normal hearing  . Lips appear normal . Opens mouth about 1 cm, decreased white exudate on the tongue noted. Respiratory:  . CTA bilaterally, no w/r/r.  Significant upper airway noise which sounds like retained secretions. Marland Kitchen Respiratory effort mildly increased. Cardiovascular:  . RRR, no m/r/g . No bilateral AKA edema. Abdomen:  . Soft, nondistended, possible mild  generalized tenderness Psychiatric:  . Mental status . Mood appears depressed.  Odd affect. Marland Kitchen Speech is fluent and clear.  She answers some questions briskly and takes a long time to answer other questions or does not answer.   I have personally reviewed the following:   Data: . CBG stable . Potassium within normal limits . Hemoglobin stable 9.7, WBC without significant change, 18.6.  Urine culture (in/out cath) multiple species.  Scheduled Meds: . allopurinol  100 mg Oral Q M,W,F  . amiodarone  200 mg Oral Daily  . apixaban  2.5 mg Oral BID  . atorvastatin  20 mg Oral QHS  . brimonidine  1 drop Left Eye BID   And  . timolol  1 drop Left Eye BID  . calcium acetate  667 mg Oral TID WC  . carbamazepine  100 mg Oral BID  . Chlorhexidine Gluconate Cloth  6 each Topical Q0600  . darbepoetin (ARANESP) injection - DIALYSIS  200 mcg Intravenous Q Sat-HD  . docusate sodium  100 mg Oral BID  . dronabinol  2.5 mg Oral QAC lunch  . feeding supplement (PRO-STAT SUGAR FREE 64)  30 mL Oral BID  . fluconazole  100 mg Oral Once per day on Tue Thu Sat  . guaiFENesin  600 mg Oral BID  . hydroxychloroquine  200 mg Oral Daily  . insulin aspart  0-5 Units Subcutaneous QHS  . insulin aspart  0-9 Units Subcutaneous TID WC  . ipratropium-albuterol  3 mL Nebulization BID  . latanoprost  1 drop Both Eyes QHS  . metoprolol tartrate  12.5 mg Oral 2 times per day on Mon Wed Fri  . multivitamin  1 tablet Oral Daily  .  pantoprazole  40 mg Oral Daily  . predniSONE  10 mg Oral Q breakfast  . sertraline  50 mg Oral Daily  . sodium chloride flush  3 mL Intravenous Q12H   Continuous Infusions: . sodium chloride      Principal Problem:   Left foot infection Active Problems:   SLE (systemic lupus erythematosus) (HCC)   Essential hypertension, benign   COPD (chronic obstructive pulmonary disease) (HCC)   Chronic diastolic heart failure (HCC)   Mixed hyperlipidemia   Atrial fibrillation (HCC)   Type  II diabetes mellitus with peripheral autonomic neuropathy (HCC)   ESRD on dialysis (HCC)   S/P AKA (above knee amputation) unilateral, right (HCC)   Status post amputation of left foot through metatarsal bone (HCC)   GERD (gastroesophageal reflux disease)   Gout   S/P AKA (above knee amputation) bilateral (HCC)   Acute on chronic anemia   Supplemental oxygen dependent   Hypertension   Acute on chronic diastolic CHF (congestive heart failure) (HCC)   FTT (failure to thrive) in adult   Oral candidiasis   Dysphagia   S/P AKA (above knee amputation) unilateral, left ([place])   LOS: 7 days

## 2018-10-01 NOTE — Consult Note (Signed)
NAME:  [name], MRN:  [id number], DOB:  Mar 16, 1946, LOS: 33 ADMISSION DATE:  09/24/2018, CONSULTATION DATE: 10/01/2018 REFERRING MD: Dr. Sarajane Jews, CHIEF COMPLAINT: Acute respiratory failure  Brief History   73 year old woman with end-stage renal disease, peripheral vascular disease, diabetes, hypertension with diastolic dysfunction.  Status post left AKA 09/26/2018 for LLE ischemia and metatarsal wound.  Poor p.o. intake, progressive weakness, A. fib since the procedure.  More more lethargy and difficulty with airway protection, now with pooling secretions and increased work of breathing. PCCM consulted for evolving resp failure  History of present illness   73 year old chronically ill woman with end-stage renal disease on hemodialysis, peripheral vascular disease, COPD, diabetes, hypertension with associated diastolic dysfunction and CHF, SLE on chronic prednisone and Plaquenil, atrial fibrillation on amiodarone and Eliquis.  She has a history of a right AKA in 05/2018, more recently a left transmetatarsal amputation 09/06/2018.  She was admitted on 12/27 with gangrene of that foot.  She was treated with vancomycin and ceftriaxone, underwent left AKA on 12/29.  Subsequent course complicated by poor p.o. intake in the setting malnutrition, oral thrush and right flank pain.  Significant difficulty with secretion clearance, evolving poor cough and increased work of breathing.  Was felt that she was experiencing an overall decline and palliative care medicine was consulted.  On 1/3 she was progressively weak, unable to protect airway adequately or clear secretions at all.  Developing some degree of encephalopathy and showing evidence for respiratory failure. Patient's stated wishes are to undergo all potential life-saving therapies, confirmed to me by her husband at bedside today.  We performed NT suctioning and obtained copious thick green secretions were posterior pharynx. Plan intubation for airway  protection and MV.   Past Medical History   has a past medical history of Anemia, Arthritis, Asthma, Atrial fibrillation ([place]), Atrial flutter ([place]), Bone spur of toe, Chronic diastolic CHF (congestive heart failure) ([place]), Colon polyposis, COPD (chronic obstructive pulmonary disease) ([place]), DVT of upper extremity (deep vein thrombosis) ([place]), ESRD (end stage renal disease) on dialysis [facility]), Essential hypertension, benign, Fractures, Glaucoma, Lupus ([place]), Pericarditis, SLE (systemic lupus erythematosus) ([place]), Type 2 diabetes mellitus ([place]), UTI (lower urinary tract infection), and Wound of right leg (06/2017).   Significant Hospital Events   12/27 admission with left lower extremity gangrenous wound 12/29 left AKA 1/3 transfer to ICU, evolving respiratory failure  Consults:  Vascular surgery Nephrology Palliative care medicine  Procedures:  LUE fistula (pre-admit) R [place] tunneled HD catheter (pre-admit) Left AKA 12/29  Significant Diagnostic Tests:  Head CT 12/27 >> stable small left parafalcine meningioma, chronic small vessel disease, nothing acute Right hip/pelvis x-ray 12/28 >> no pelvic or hip fracture, mild to moderate DJD of both hips EEG 1/2 >> global slowing without any epileptiform activity  Micro Data:  Blood cultures 12/27 >> negative Blood cultures 1/3 >>  Urine 1/1 >> multiple species, nondiagnostic  Antimicrobials:  Cefepime 12/27 >> 1/1; 1/3 >>  Vancomycin 12/27 >> 1/1; 1/3 >> Fluconazole 1/2 >>  Flagyl 1/3 >>   Interim history/subjective:  As per HPI  Objective   Blood pressure (!) 170/52, pulse (!) 109, temperature (!) 100.5 F (38.1 C), temperature source Axillary, resp. rate 20, height 5' (1.524 m), weight 50.4 kg, SpO2 94 %.    FiO2 (%):  [28 %] 28 %   Intake/Output Summary (Last 24 hours) at 10/01/2018 1539 Last data filed at 10/01/2018 1500 Gross per 24 hour  Intake 452.99 ml  Output -  Net 452.99 ml   Filed Weights   09/28/18 1054 09/30/18  0930 09/30/18 1315  Weight: 54.7 kg 52.4 kg 50.4 kg    Examination: General: Acutely ill woman, globally weak, HENT: Oropharynx dry, obvious pooling secretions that she is too weak to clear.  Pupils are equal Lungs: Increased work of breathing, some accessory muscle use.  Coarse upper airway referred noise, otherwise clear Cardiovascular: Irregular, distant, no apparent murmur Abdomen: Soft, nondistended with positive bowel sounds Extremities: Old right AKA, new left AKA-wound is without odor or purulence Neuro: Awake, globally weak, tries to respond to questions but is poorly oriented  Resolved Hospital Problem list     Assessment & Plan:  Acute respiratory failure, appears to be principally due to progressive weakness and poor airway protection, aspiration given dysphasia.  Consider contribution of evolving sepsis although no clear source. COPD Plan to move to the ICU for intubation and airway protection.  Discussed this with the patient's husband and daughter at bedside 1/3.  Explained that there is some evidence here for an overall generalized decline and failure and that there may not be a reversible process here.  All the same they would like for her to have every opportunity to reverse any acute process, want her supported maximally Bronchodilators as ordered Respiratory cultures Follow ABG and chest x-ray  Left lower extremity AKA due to PVD and foot wound infection. Oral candidiasis Possible sepsis Broad-spectrum antibiotics were restarted 1/3, agree with vancomycin, cefepime, flagyl. Narrow as able.  Repeat blood, respiratory cultures Stress dose steroids as below  End-stage renal disease Last dialysis was 1/2, was well-tolerated Follow BMP, HD per nephrology recommendations  Hypertension Chronic diastolic CHF Paroxysmal atrial fibrillation Amiodarone Hold metoprolol since until stable on sedation  Diabetes Sliding scale insulin per protocol  SLE, outpatient  regimen prednisone and Plaquenil Plan to stop Plaquenil temporarily given concern for possible evolving sepsis Stop prednisone and change stress dose steroids 1/3  Acute multifactorial encephalopathy, chiefly metabolic Right forearm twitching noted earlier in hospitalization, EEG reassuring Depression ? Seizures  Sedation for mechanical ventilation Intermittent Versed, fentanyl Zoloft was just started this admission - will hold for now She is on carbamazepine, outpt med although husband unclear why or that she was even on it  Chronic right-sided abdominal pain No clear etiology, follow with bowel regimen  3 cm right upper pole renal mass of unclear significance Not clear that any biopsy has been performed.  Question whether this may be contributing to current failure to thrive, presentation.  Consider repeat CT abdomen when stable to do so if we believe this will help with prognostication and overall goals for care.   Best practice:  Diet: NPO Pain/Anxiety/Delirium protocol (if indicated): intermittent versed, fentanyl  VAP protocol (if indicated): yes DVT prophylaxis: eliquis GI prophylaxis: PPI Glucose control: SSI Mobility: bedrest Code Status: Full  Family Communication: discussed with husband and multiple family members at bedside 1/3 Disposition: ICU  Labs   CBC: Recent Labs  Lab 09/24/18 1749  09/26/18 0552 09/27/18 0917 09/28/18 0328 09/30/18 1025 10/01/18 0244  WBC 7.1   < > 9.9 8.7 10.4 16.3* 18.6*  NEUTROABS 5.9  --   --   --   --   --   --   HGB 9.5*   < > 8.4* 8.3* 8.8* 8.1* 9.7*  HCT 33.2*   < > 28.6* 28.4* 30.3* 27.6* 32.4*  MCV 94.9   < > 93.8 92.8 93.2 92.6 91.0  PLT 192   < >  171 205 200 296 299   < > = values in this interval not displayed.    Basic Metabolic Panel: Recent Labs  Lab 09/27/18 0917 09/28/18 0328 09/29/18 0342 09/30/18 0151 10/01/18 0244  NA 136 136 136 137 140  K 3.0* 3.4* 3.6 3.9 4.9  CL 99 99 99 100 100  CO2 25 24 27 26  26   GLUCOSE 117* 141* 115* 237* 169*  BUN 39* 49* 16 40* 22  CREATININE 4.35* 5.38* 3.09* 4.48* 3.07*  CALCIUM 8.0* 8.3* 8.2* 7.9* 8.3*  PHOS 3.0 3.9 1.4* 4.8* 3.4   GFR: Estimated Creatinine Clearance: 11.9 mL/min (A) (by C-G formula based on SCr of 3.07 mg/dL (H)). Recent Labs  Lab 09/24/18 1822  09/27/18 0917 09/28/18 0328 09/30/18 1025 10/01/18 0244 10/01/18 1409  WBC  --    < > 8.7 10.4 16.3* 18.6*  --   LATICACIDVEN 1.35  --   --   --   --   --  1.7   < > = values in this interval not displayed.    Liver Function Tests: Recent Labs  Lab 09/24/18 1749  09/27/18 0917 09/28/18 0328 09/29/18 0342 09/30/18 0151 10/01/18 0244  AST 31  --   --   --   --   --   --   ALT 9  --   --   --   --   --   --   ALKPHOS 72  --   --   --   --   --   --   BILITOT 0.6  --   --   --   --   --   --   PROT 6.3*  --   --   --   --   --   --   ALBUMIN 1.9*   < > 1.6* 1.7* 1.6* 1.5* 1.6*   < > = values in this interval not displayed.   No results for input(s): LIPASE, AMYLASE in the last 168 hours. No results for input(s): AMMONIA in the last 168 hours.  ABG    Component Value Date/Time   PHART 7.412 10/01/2018 1438   PCO2ART 39.8 10/01/2018 1438   PO2ART 62.9 (L) 10/01/2018 1438   HCO3 24.9 10/01/2018 1438   TCO2 32 09/24/2018 1821   ACIDBASEDEF 5.4 (H) 11/03/2017 1025   O2SAT 91.4 10/01/2018 1438     Coagulation Profile: Recent Labs  Lab 09/26/18 0552 10/01/18 1409  INR 1.17 1.45    Cardiac Enzymes: No results for input(s): CKTOTAL, CKMB, CKMBINDEX, TROPONINI in the last 168 hours.  HbA1C: Hgb A1c MFr Bld  Date/Time Value Ref Range Status  09/06/2018 07:32 AM 4.5 (L) 4.8 - 5.6 % Final    Comment:    (NOTE) Pre diabetes:          5.7%-6.4% Diabetes:              >6.4% Glycemic control for   <7.0% adults with diabetes   08/09/2018 11:59 AM 5.0 4.8 - 5.6 % Final    Comment:             Prediabetes: 5.7 - 6.4          Diabetes: >6.4          Glycemic control  for adults with diabetes: <7.0     CBG: Recent Labs  Lab 09/30/18 0632 09/30/18 1635 09/30/18 2112 10/01/18 0625 10/01/18 1151  GLUCAP 139* 172* 215* 175* 209*    Review  of Systems:   Patient denies pain.  All she can say is "help me"  Past Medical History  She,  has a past medical history of Anemia, Arthritis, Asthma, Atrial fibrillation ([place]), Atrial flutter ([place]), Bone spur of toe, Chronic diastolic CHF (congestive heart failure) ([place]), Colon polyposis, COPD (chronic obstructive pulmonary disease) ([place]), DVT of upper extremity (deep vein thrombosis) ([place]), ESRD (end stage renal disease) on dialysis [facility]), Essential hypertension, benign, Fractures, Glaucoma, Lupus ([place]), Pericarditis, SLE (systemic lupus erythematosus) ([place]), Type 2 diabetes mellitus ([place]), UTI (lower urinary tract infection), and Wound of right leg (06/2017).   Surgical History    Past Surgical History:  Procedure Laterality Date  . ABDOMINAL AORTOGRAM W/LOWER EXTREMITY N/A 12/18/2017   Procedure: ABDOMINAL AORTOGRAM W/LOWER EXTREMITY;  Surgeon: Elam Dutch, MD;  Location: [place] [facility];  Service: Cardiovascular;  Laterality: N/A;  bilateral  . ABDOMINAL HYSTERECTOMY  1983  . AMPUTATION Right 06/08/2018   Procedure: RIGHT ABOVE KNEE AMPUTATION;  Surgeon: Elam Dutch, MD;  Location[facility] OR;  Service: Vascular;  Laterality: Right;  . AMPUTATION Left 09/26/2018   Procedure: AMPUTATION ABOVE KNEE;  Surgeon: Rosetta Posner, MD;  Location: [facility] OR;  Service: Vascular;  Laterality: Left;  WILL BE AN INPATIENT  . ANKLE SURGERY  1993  . AV FISTULA PLACEMENT Left 03/27/2014   Procedure: ARTERIOVENOUS (AV) FISTULA CREATION;  Surgeon: Rosetta Posner, MD;  Location: [place];  Service: Vascular;  Laterality: Left;  . BACK SURGERY  1980  . BASCILIC VEIN TRANSPOSITION Left 01/13/2018   Procedure: LEFT BASILIC VEIN TRANSPOSITION SECOND STAGE;  Surgeon: Elam Dutch, MD;  Location: [place];  Service: Vascular;   Laterality: Left;  . CARDIOVASCULAR STRESS TEST  12/19/2009   no stress induced rhythm abnormalities, ekg negative for ischemia  . CARDIOVERSION  09/16/2012   Procedure: CARDIOVERSION;  Surgeon: Sanda Klein, MD;  Location: [facility] ENDOSCOPY;  Service: Cardiovascular;  Laterality: N/A;  . CATARACT EXTRACTION    . COLONOSCOPY  09/20/2012   Dr. [name]: multiple tubular adenomas   . COLONOSCOPY  2005   Dr. [name]. Polyps, path unknown   . COLONOSCOPY N/A 07/24/2016   Dr. [name]: 3 significant size polyps removed from the colon, tubular adenomas.  Next colonoscopy in October 2020.  Mar[facility] Kitchen ESOPHAGOGASTRODUODENOSCOPY  09/20/2012   Dr. [name]: duodenal erosion and possible resolving ulcer at the angularis   . ESOPHAGOGASTRODUODENOSCOPY N/A 03/24/2018   Dr. [name]: Mild erosive reflux esophagitis, erosive gastropathy and enteropathy fairly extensive, no H. pylori on biopsy.  Marland Kitchen EYE SURGERY    . IR FLUORO GUIDE CV LINE RIGHT  11/09/2017  . IR RADIOLOGIST EVAL & MGMT  09/01/2018  . IR US GUIDE VASC ACCESS RIGHT  11/09/2017  . REVISON OF ARTERIOVENOUS FISTULA Left 03/15/2018   Procedure: REVISION OF Left arm BASILIC VEIN TRANSPOSITION;  Surgeon: Angelia Mould, MD;  Location: [place];  Service: Vascular;  Laterality: Left;  . TEE WITHOUT CARDIOVERSION  09/16/2012   Procedure: TRANSESOPHAGEAL ECHOCARDIOGRAM (TEE);  Surgeon: Sanda Klein, MD;  Location[facility] ENDOSCOPY;  Service: Cardiovascular;  Laterality: N/A;  . TRANSESOPHAGEAL ECHOCARDIOGRAM WITH CARDIOVERSION  09/16/2012   EF 60-65%, moderate LVH, moderate regurg of the aortic valve, LA moderately dilated  . TRANSMETATARSAL AMPUTATION Left 09/06/2018   Procedure: TRANSMETATARSAL AMPUTATION;  Surgeon: Angelia Mould, MD;  Location: [facility] OR;  Service: Vascular;  Laterality: Left;     Social History   reports that she quit smoking about 6 years ago. Her smoking use  included cigarettes. She has a 30.00 pack-year smoking history. She has never used  smokeless tobacco. She reports that she does not drink alcohol or use drugs.   Family History   Her family history includes Diabetes in her brother, father, and sister; Hypertension in her brother, father, and sister; Stroke in her mother. There is no history of Colon cancer.   Allergies Allergies  Allergen Reactions  . Ace Inhibitors Cough     Home Medications  Prior to Admission medications   Medication Sig Start Date End Date Taking? Authorizing Provider  acetaminophen (TYLENOL) 325 MG tablet Take 1-2 tablets (325-650 mg total) by mouth every 4 (four) hours as needed for mild pain. Patient taking differently: Take 325-650 mg by mouth every 4 (four) hours as needed (for mild to moderate pain).  06/24/18  Yes Love, Ivan Anchors, PA-C  albuterol (PROVENTIL HFA;VENTOLIN HFA) 108 (90 BASE) MCG/ACT inhaler Inhale 2 puffs into the lungs every 6 (six) hours as needed for wheezing or shortness of breath.    Yes [provider]  allopurinol (ZYLOPRIM) 100 MG tablet Take 100 mg by mouth every Monday, Wednesday, and Friday.    Yes [provider]  Amino Acids-Protein Hydrolys (FEEDING SUPPLEMENT, PRO-STAT SUGAR FREE 64,) LIQD Take 30 mLs by mouth 2 (two) times daily. 12/29/17  Yes Danford, Suann Larry, MD  amiodarone (PACERONE) 200 MG tablet Take 1 tablet (200 mg total) by mouth daily. 08/04/18  Yes Strader, [place], PA-C  apixaban (ELIQUIS) 5 MG TABS tablet Take 1 tablet (5 mg total) by mouth 2 (two) times daily. 12/29/17  Yes Danford, Suann Larry, MD  atorvastatin (LIPITOR) 20 MG tablet Take 20 mg by mouth at bedtime.    Yes [provider]  brimonidine-timolol (COMBIGAN) 0.2-0.5 % ophthalmic solution Place 1 drop into the left eye 2 (two) times daily.    Yes [provider]  carbamazepine (TEGRETOL XR) 100 MG 12 hr tablet Take 100 mg by mouth 2 (two) times daily.   Yes [provider]  docusate sodium (COLACE) 100 MG capsule Take 100 mg by mouth 2 (two)  times daily.   Yes [provider]  fentaNYL (DURAGESIC - DOSED MCG/HR) 25 MCG/HR patch Place 25 mcg onto the skin every 3 (three) days.   Yes [provider]  folic acid (FOLVITE) 292 MCG tablet Take 800 mcg by mouth daily.   Yes [provider]  insulin aspart (NOVOLOG FLEXPEN) 100 UNIT/ML FlexPen Inject 5 Units into the skin 3 (three) times daily with meals as needed (if BGL is over 100).   Yes [provider]  latanoprost (XALATAN) 0.005 % ophthalmic solution Place 1 drop into both eyes at bedtime.   Yes [provider]  metoprolol tartrate (LOPRESSOR) 25 MG tablet Take 1/2 tablet twice a day on Mondays, Wednesdays and Fridays Patient taking differently: Take 12.5 mg by mouth See admin instructions. Take 12.5 mg by mouth two times a day on Mon/Wed/Fri 06/24/18  Yes Love, Ivan Anchors, PA-C  multivitamin (RENA-VIT) TABS tablet Take 1 tablet by mouth daily.   Yes [provider]  Oxycodone HCl 10 MG TABS Take 10 mg by mouth every 6 (six) hours as needed (for moderate to severe pain).    Yes [provider]  pantoprazole (PROTONIX) 40 MG tablet Take 1 tablet (40 mg total) by mouth daily. 03/29/18  Yes Rourk, [name], MD  predniSONE (DELTASONE) 5 MG tablet Take 5 mg by mouth daily with breakfast.  Yes [provider]  hydroxychloroquine (PLAQUENIL) 200 MG tablet Take 200 mg by mouth daily.  09/24/18   [provider]  UNIFINE PENTIPS 31G X 6 MM MISC USE AS DIRECTED AT BEDTIME WITH LEVEMIR AND WITH SLIDING SCALE INSULIN. Patient taking differently: as directed.  09/03/18   Cassandria Anger, MD     Critical care time: 9      Baltazar Apo, MD, PhD 10/01/2018, 4:39 PM [facility] [phone] or if no answer [phone]

## 2018-10-01 NOTE — Consult Note (Addendum)
Consultation Note Date: 10/01/2018   Patient Name: [name]  DOB: 15-Nov-1945  MRN: [id number]  Age / Sex: 73 y.o., female  PCP: Sharilyn Sites, MD Referring Physician: Samuella Cota, MD  Reason for Consultation: Establishing goals of care and Psychosocial/spiritual support  HPI/Patient Profile: 73 y.o. female  with past medical history of COPD, end-stage renal disease on hemodialysis diastolic heart failure, dysphasia, diabetes type 2, lupus, admitted on 09/24/2018 with with fever, cellulitis necrotic metatarsal wound at previous operative site.  She was admitted to the hospital for surgery for a left AKA which she underwent on 09/26/2018.  Since that time she has had decreased oral intake chronic right-sided abdominal pain, dysphasia, atrial fib.  Per chart review, she appears depressed and has been started on Zoloft 50 mg daily.  She has had multiple hospitalizations over the past several months.  Consult ordered for goals of care.   Clinical Assessment and Goals of Care: Patient seen, chart reviewed.  Met with patient's spouse, Jodel Mayhall.  Shared with Mr. Cesaro that patient would not be a candidate for prosthetic leg and goal would be for her to gain enough strength to transfer from the bed to the chair.  Patient at this point is very weak, lethargic.  Her healthcare proxy would be her husband, Carlissa Pesola at [phone].  Patient has 1 stepson to whom she is very close  Introduced palliative medicine services as an additional resource and source of support compared palliative medicine and hospice; defined terms.  Also discussed CODE STATUS as well as defined full code and DO NOT RESUSCITATE.  Husband states he wants his wife to be full code.  I did share with him my concerns that I was worried about her, how ill she looks.  She has a fever today of 101.5.  She is not taking  anything by mouth and is unable to swallow any pills.  She is lethargic  Husband describes a poor quality of life over the past 2 years.  She is spent a great deal of time in the hospital.  She underwent a right AKA in September and rebounded from that and she started on hemodialysis approximately a year and a half ago.  She recently underwent a left metatarsal amputation and was at [facility] for rehab when this progressed to left lower extremity gangrene, now with left AKA performed on 09/26/2018.  Husband understands the best case scenario is if she were to get strong enough to be able to pivot herself from the bed to a wheelchair.  He states that she is not able to do that unfortunately, she would have to live in a skilled nursing facility.  I did begin discussions regarding quality of life.    SUMMARY OF RECOMMENDATIONS   Full code by choice Patient is acutely ill.  Palliative medicine to continue to monitor and stay involved and continue with goals of care discussion as we see how patient progresses For now, patient is full scope of treatment Code Status/Advance Care Planning:  Full code    Symptom Management:   Pain: She is averaging 2 as needed doses of oxycodone 10 mg daily but is unable to swallow any pills today.  I will change oxycodone to oxycodone do not concentrate 10 mg every 3 hours as needed for pain  Secretions: We will add Robinul 0.2 mg IV every 6 hours as needed  Palliative Prophylaxis:   Aspiration, Bowel Regimen, Delirium Protocol, Eye Care, Frequent Pain Assessment, Oral Care and Turn Reposition  Additional Recommendations (Limitations, Scope, Preferences):  Full Scope Treatment  Psycho-social/Spiritual:   Desire for further Chaplaincy support:no  Additional Recommendations: Referral to Community Resources   Prognosis:   Unable to determine  Discharge Planning: To Be Determined      Primary Diagnoses: Present on Admission: . Atrial fibrillation  ([place]) . COPD (chronic obstructive pulmonary disease) ([place]) . Essential hypertension, benign . Mixed hyperlipidemia . SLE (systemic lupus erythematosus) ([place]) . 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 Type II diabetes mellitus with peripheral autonomic neuropathy (HCC) . GERD (gastroesophageal reflux disease) . Gout . Chronic diastolic heart failure ([place]) . Left foot infection . (Resolved) Acute metabolic encephalopathy   I have reviewed the medical record, interviewed the patient and family, and examined the patient. The following aspects are pertinent.  Past Medical History:  Diagnosis Date  . Anemia   . Arthritis   . Asthma   . Atrial fibrillation ([place])   . Atrial flutter ([place])    a. s/p TEE/DCCV December 2013 b. 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 Bone spur of toe    Right 5th toe  . Chronic diastolic CHF (congestive heart failure) (HCC)    a. EF 50-55% by echo in 2015  . Colon polyposis   . COPD (chronic obstructive pulmonary disease) ([place])   . DVT of upper extremity (deep vein thrombosis) ([place])    Right basilic vein, June 5956  . ESRD (end stage renal disease) on dialysis ([place])    "TTF; [place]; off [employer]." (09/07/2018)  . Essential hypertension, benign   . Fractures   . Glaucoma   . Lupus ([place])   . Pericarditis   . SLE (systemic lupus erythematosus) ([place])   . Type 2 diabetes mellitus ([place])   . UTI (lower urinary tract infection)   . Wound of right leg 06/2017   Social History   Socioeconomic History  . Marital status: Married    Spouse name: Not on file  . Number of children: Not on file  . Years of education: Not on file  . Highest education level: Not on file  Occupational History  . Not on file  Social Needs  . Financial resource strain: Not on  file  . Food insecurity:    Worry: Not on file    Inability: Not on file  . Transportation needs:    Medical: Not on file    Non-medical: Not on file  Tobacco Use  . Smoking status: Former Smoker    Packs/day: 1.00    Years: 30.00    Pack years: 30.00    Types: Cigarettes    Last attempt to quit: 08/29/2012    Years since quitting: 6.0  . Smokeless tobacco: Never Used  . Tobacco comment: quit 08-2012  Substance and Sexual Activity  . Alcohol use: No  Alcohol/week: 0.0 standard drinks  . Drug use: No  . Sexual activity: Yes    Partners: Male    Birth control/protection: Surgical    Comment: hyst  Lifestyle  . Physical activity:    Days per week: Not on file    Minutes per session: Not on file  . Stress: Not on file  Relationships  . Social connections:    Talks on phone: Not on file    Gets together: Not on file    Attends religious service: Not on file    Active member of club or organization: Not on file    Attends meetings of clubs or organizations: Not on file    Relationship status: Not on file  Other Topics Concern  . Not on file  Social History Narrative  . Not on file   Family History  Problem Relation Age of Onset  . Diabetes Brother   . Diabetes Father   . Hypertension Father   . Hypertension Sister   . Stroke Mother   . Diabetes Sister   . Hypertension Brother   . Colon cancer Neg Hx    Scheduled Meds: . allopurinol  100 mg Oral Q M,W,F  . amiodarone  200 mg Oral Daily  . apixaban  2.5 mg Oral BID  . atorvastatin  20 mg Oral QHS  . brimonidine  1 drop Left Eye BID   And  . timolol  1 drop Left Eye BID  . calcium acetate  667 mg Oral TID WC  . carbamazepine  100 mg Oral BID  . Chlorhexidine Gluconate Cloth  6 each Topical Q0600  . darbepoetin (ARANESP) injection - DIALYSIS  200 mcg Intravenous Q Sat-HD  . docusate sodium  100 mg Oral BID  . dronabinol  2.5 mg Oral QAC lunch  . feeding supplement (PRO-STAT SUGAR FREE 64)  30 mL Oral BID  .  fluconazole  100 mg Oral Once per day on Tue Thu Sat  . guaiFENesin  600 mg Oral BID  . hydroxychloroquine  200 mg Oral Daily  . insulin aspart  0-5 Units Subcutaneous QHS  . insulin aspart  0-9 Units Subcutaneous TID WC  . ipratropium-albuterol  3 mL Nebulization BID  . latanoprost  1 drop Both Eyes QHS  . metoprolol tartrate  12.5 mg Oral 2 times per day on Mon Wed Fri  . multivitamin  1 tablet Oral Daily  . pantoprazole  40 mg Oral Daily  . predniSONE  10 mg Oral Q breakfast  . sertraline  50 mg Oral Daily  . sodium chloride flush  3 mL Intravenous Q12H   Continuous Infusions: . sodium chloride     PRN Meds:.sodium chloride, acetaminophen **OR** acetaminophen, albuterol, hydrALAZINE, hydrOXYzine, labetalol, metoprolol tartrate, ondansetron, oxyCODONE, phenol, sodium chloride flush Medications Prior to Admission:  Prior to Admission medications   Medication Sig Start Date End Date Taking? Authorizing Provider  acetaminophen (TYLENOL) 325 MG tablet Take 1-2 tablets (325-650 mg total) by mouth every 4 (four) hours as needed for mild pain. Patient taking differently: Take 325-650 mg by mouth every 4 (four) hours as needed (for mild to moderate pain).  06/24/18  Yes Love, Ivan Anchors, PA-C  albuterol (PROVENTIL HFA;VENTOLIN HFA) 108 (90 BASE) MCG/ACT inhaler Inhale 2 puffs into the lungs every 6 (six) hours as needed for wheezing or shortness of breath.    Yes [provider]  allopurinol (ZYLOPRIM) 100 MG tablet Take 100 mg by mouth every Monday, Wednesday, and Friday.  Yes [provider]  Amino Acids-Protein Hydrolys (FEEDING SUPPLEMENT, PRO-STAT SUGAR FREE 64,) LIQD Take 30 mLs by mouth 2 (two) times daily. 12/29/17  Yes Danford, Suann Larry, MD  amiodarone (PACERONE) 200 MG tablet Take 1 tablet (200 mg total) by mouth daily. 08/04/18  Yes Strader, [place], PA-C  apixaban (ELIQUIS) 5 MG TABS tablet Take 1 tablet (5 mg total) by mouth 2 (two) times daily. 12/29/17  Yes  Danford, Suann Larry, MD  atorvastatin (LIPITOR) 20 MG tablet Take 20 mg by mouth at bedtime.    Yes [provider]  brimonidine-timolol (COMBIGAN) 0.2-0.5 % ophthalmic solution Place 1 drop into the left eye 2 (two) times daily.    Yes [provider]  carbamazepine (TEGRETOL XR) 100 MG 12 hr tablet Take 100 mg by mouth 2 (two) times daily.   Yes [provider]  docusate sodium (COLACE) 100 MG capsule Take 100 mg by mouth 2 (two) times daily.   Yes [provider]  fentaNYL (DURAGESIC - DOSED MCG/HR) 25 MCG/HR patch Place 25 mcg onto the skin every 3 (three) days.   Yes [provider]  folic acid (FOLVITE) 315 MCG tablet Take 800 mcg by mouth daily.   Yes [provider]  insulin aspart (NOVOLOG FLEXPEN) 100 UNIT/ML FlexPen Inject 5 Units into the skin 3 (three) times daily with meals as needed (if BGL is over 100).   Yes [provider]  latanoprost (XALATAN) 0.005 % ophthalmic solution Place 1 drop into both eyes at bedtime.   Yes [provider]  metoprolol tartrate (LOPRESSOR) 25 MG tablet Take 1/2 tablet twice a day on Mondays, Wednesdays and Fridays Patient taking differently: Take 12.5 mg by mouth See admin instructions. Take 12.5 mg by mouth two times a day on Mon/Wed/Fri 06/24/18  Yes Love, Ivan Anchors, PA-C  multivitamin (RENA-VIT) TABS tablet Take 1 tablet by mouth daily.   Yes [provider]  Oxycodone HCl 10 MG TABS Take 10 mg by mouth every 6 (six) hours as needed (for moderate to severe pain).    Yes [provider]  pantoprazole (PROTONIX) 40 MG tablet Take 1 tablet (40 mg total) by mouth daily. 03/29/18  Yes Rourk, [name], MD  predniSONE (DELTASONE) 5 MG tablet Take 5 mg by mouth daily with breakfast.   Yes [provider]  hydroxychloroquine (PLAQUENIL) 200 MG tablet Take 200 mg by mouth daily.  09/24/18   [provider]  UNIFINE PENTIPS 31G X 6 MM MISC USE AS DIRECTED AT  BEDTIME WITH LEVEMIR AND WITH SLIDING SCALE INSULIN. Patient taking differently: as directed.  09/03/18   Cassandria Anger, MD   Allergies  Allergen Reactions  . Ace Inhibitors Cough   Review of Systems  Unable to perform ROS: Acuity of condition    Physical Exam Vitals signs and nursing note reviewed.  Constitutional:      Comments: Lethargic.  Does not open her eyes to light touch or voice  HENT:     Head: Normocephalic and atraumatic.  Pulmonary:     Comments: No increased work of breathing noted but patient does have upper airway secretions forming Musculoskeletal:     Comments: Status post left AKA 09/26/2018  Skin:    General: Skin is warm and dry.  Neurological:     Comments: Patient is lethargic; no overt agitation otherwise unable to test  Psychiatric:     Comments: No overt agitation otherwise unable to test  Vital Signs: BP 138/68 (BP Location: Right Arm)   Pulse 100   Temp 99.7 F (37.6 C) (Oral)   Resp 20   Ht 5' (1.524 m)   Wt 50.4 kg   SpO2 96%   BMI 21.70 kg/m  Pain Scale: 0-10 POSS *See Group Information*: 1-Acceptable,Awake and alert Pain Score: 10-Worst pain ever   SpO2: SpO2: 96 % O2 Device:SpO2: 96 % O2 Flow Rate: .O2 Flow Rate (L/min): 2 L/min  IO: Intake/output summary:   Intake/Output Summary (Last 24 hours) at 10/01/2018 1151 Last data filed at 10/01/2018 0900 Gross per 24 hour  Intake 300 ml  Output 2000 ml  Net -1700 ml    LBM: Last BM Date: 09/30/18 Baseline Weight: Weight: 61.8 kg Most recent weight: Weight: 50.4 kg     Palliative Assessment/Data:   Flowsheet Rows     Most Recent Value  Intake Tab  Referral Department  Hospitalist  Unit at Time of Referral  Orthopedic Unit  Palliative Care Primary Diagnosis  Sepsis/Infectious Disease  Date Notified  09/30/18  Palliative Care Type  New Palliative care  Reason for referral  Clarify Goals of Care, Psychosocial or Spiritual support  Date of Admission  09/24/18    Date first seen by Palliative Care  10/01/18  # of days Palliative referral response time  1 Day(s)  # of days IP prior to Palliative referral  6  Clinical Assessment  Palliative Performance Scale Score  30%  Pain Max last 24 hours  Not able to report  Pain Min Last 24 hours  Not able to report  Dyspnea Max Last 24 Hours  Not able to report  Dyspnea Min Last 24 hours  Not able to report  Nausea Max Last 24 Hours  Not able to report  Nausea Min Last 24 Hours  Not able to report  Anxiety Max Last 24 Hours  Not able to report  Anxiety Min Last 24 Hours  Not able to report  Other Max Last 24 Hours  Not able to report  Psychosocial & Spiritual Assessment  Palliative Care Outcomes  Patient/Family meeting held?  Yes  Who was at the meeting?  husband  Palliative Care follow-up planned  Yes, Facility      Time In: 1200 Time Out: 1310 Time Total: 70 min Greater than 50%  of this time was spent counseling and coordinating care related to the above assessment and plan.  Signed by: Dory Horn, NP   Please contact Palliative Medicine Team phone at [phone] for questions and concerns.  For individual provider: See Shea Evans

## 2018-10-01 NOTE — Progress Notes (Addendum)
[facility] Progress Note   Subjective:  Seen in room. Alert but very weak appearing. No specific complaints.  Making gurgling sounds, CXR, abd xray ordered, concern for aspiration.   Objective Vitals:   10/01/18 0424 10/01/18 0934 10/01/18 1000 10/01/18 1021  BP: (!) 140/51   138/68  Pulse: 96 (!) 101  100  Resp: 14 20    Temp: 99.2 F (37.3 C)  99.7 F (37.6 C) 99.7 F (37.6 C)  TempSrc: Oral  Oral Oral  SpO2: 96% 95%  96%  Weight:      Height:       Physical Exam General: Chronically ill appearing elderly female on nasal oxygen Heart: RRR 2/6 SEM  Lungs: Loud upper airway sounds heard throughout Abdomen: soft NT/ND Extremities: bilat amp. NO stump edema Dialysis Access: R chest [facility]  Dialysis Orders:  [facility] TTS  3.5hr EDW 61kg 180NRe 400/800  2/2 180NRe 400/800  Heparin 1850 unit bolus  Mircera 225 q2weeks (last 09/14/18)   Assessment/Plan: 1.s/p Left AKAdue to infection/gangrene 12/29 Dr. Donnetta Hutching  2. ESRD- HD TTS. Next HD Saturday on schedule.  3. Anemia- Hgb 9.7  Aranesp 200 q Saturday  4. Secondary hyperparathyroidism-No VDRA - On phoslo 667 TID.  5.HTN/volume-BP elevated. Net UF 2L on 1/2. Well below EDW here.  6. Nutrition/PCM. Very low albumin. Poor PO intake  supplements/renal vit. 7. SLE - plaquenil/solulcortef 8. DM per primarySSI 9. Access needs- has 2nd stage BVT left arm which has occluded - appreciate Dr. Donnetta Hutching - assistance - for upper left arm AVG in the future when current medical issues resolved. [place] - current meds 11.Afib- amiodarone/resumed apixaban. 12. Abd pain - some chronicity -  Dispo: Palliative care consult pending for GOC   Lynnda Child [facility] Pager [phone] 10/01/2018,10:37 AM  LOS: 7 days   Additional Objective Labs: Basic Metabolic Panel: Recent Labs  Lab 09/29/18 0342 09/30/18 0151 10/01/18 0244  NA 136 137 140  K 3.6 3.9 4.9  CL 99 100 100  CO2 27 26 26    GLUCOSE 115* 237* 169*  BUN 16 40* 22  CREATININE 3.09* 4.48* 3.07*  CALCIUM 8.2* 7.9* 8.3*  PHOS 1.4* 4.8* 3.4   CBC: Recent Labs  Lab 09/24/18 1749  09/26/18 0552 09/27/18 0917 09/28/18 0328 09/30/18 1025 10/01/18 0244  WBC 7.1   < > 9.9 8.7 10.4 16.3* 18.6*  NEUTROABS 5.9  --   --   --   --   --   --   HGB 9.5*   < > 8.4* 8.3* 8.8* 8.1* 9.7*  HCT 33.2*   < > 28.6* 28.4* 30.3* 27.6* 32.4*  MCV 94.9   < > 93.8 92.8 93.2 92.6 91.0  PLT 192   < > 171 205 200 296 299   < > = values in this interval not displayed.   Blood Culture    Component Value Date/Time   SDES URINE, CATHETERIZED 09/29/2018 2353   SPECREQUEST  09/29/2018 2353    NONE Performed at [facility], [place] [address] [place],  [address]    CULT MULTIPLE SPECIES PRESENT, SUGGEST RECOLLECTION (A) 09/29/2018 2353   REPTSTATUS 10/01/2018 FINAL 09/29/2018 2353    Cardiac Enzymes: No results for input(s): CKTOTAL, CKMB, CKMBINDEX, TROPONINI in the last 168 hours. CBG: Recent Labs  Lab 09/29/18 2225 09/30/18 0632 09/30/18 1635 09/30/18 2112 10/01/18 0625  GLUCAP 290* 139* 172* 215* 175*   Iron Studies: No results for input(s): IRON, TIBC,  TRANSFERRIN, FERRITIN in the last 72 hours. Lab Results  Component Value Date   INR 1.17 09/26/2018   INR 1.18 06/08/2018   INR 1.05 03/15/2018   Medications: . sodium chloride     . allopurinol  100 mg Oral Q M,W,F  . amiodarone  200 mg Oral Daily  . apixaban  2.5 mg Oral BID  . atorvastatin  20 mg Oral QHS  . brimonidine  1 drop Left Eye BID   And  . timolol  1 drop Left Eye BID  . calcium acetate  667 mg Oral TID WC  . carbamazepine  100 mg Oral BID  . Chlorhexidine Gluconate Cloth  6 each Topical Q0600  . darbepoetin (ARANESP) injection - DIALYSIS  200 mcg Intravenous Q Sat-HD  . docusate sodium  100 mg Oral BID  . dronabinol  2.5 mg Oral QAC lunch  . feeding supplement (PRO-STAT SUGAR FREE 64)  30 mL Oral BID  . fluconazole  100 mg Oral  Once per day on Tue Thu Sat  . guaiFENesin  600 mg Oral BID  . hydroxychloroquine  200 mg Oral Daily  . insulin aspart  0-5 Units Subcutaneous QHS  . insulin aspart  0-9 Units Subcutaneous TID WC  . ipratropium-albuterol  3 mL Nebulization BID  . latanoprost  1 drop Both Eyes QHS  . metoprolol tartrate  12.5 mg Oral 2 times per day on Mon Wed Fri  . multivitamin  1 tablet Oral Daily  . pantoprazole  40 mg Oral Daily  . predniSONE  10 mg Oral Q breakfast  . sertraline  50 mg Oral Daily  . sodium chloride flush  3 mL Intravenous Q12H

## 2018-10-01 NOTE — Progress Notes (Signed)
PT Cancellation Note  Patient Details Name: [name] MRN: [id number] DOB: 1946/08/05   Cancelled Treatment:    Reason Eval/Treat Not Completed: Medical issues which prohibited therapy PT will continue to follow acutely.    Salina April, PTA Acute Rehabilitation Services Pager: [phone] Office: [phone]   10/01/2018, 4:08 PM

## 2018-10-01 NOTE — Progress Notes (Signed)
Dr. Sarajane Jews to bedside to assess patient. See new orders.   Lucius Conn, RN

## 2018-10-01 NOTE — Plan of Care (Signed)
  Interdisciplinary Goals of Care Family Meeting   Date carried out:: 10/01/2018  Location of the meeting: Bedside  Member's involved: Nurse Practitioner and Family Member or next of kin  Durable Power of Attorney or acting medical decision maker: Husband - [name] (contact:  [phone], [phone])  Discussion: We discussed goals of care for [name] .  Multiple family members present in room for discussion (husband, sister, niece x2).  We reviewed patients current medical conditions to include failure to thrive, recent stress of surgery/amputation, ESRD, dCHF and dysphagia.  We discussed plan of care for the patient.  Her husband indicates they have never had discussions regarding end of life.  Reviewed the concept of CPR, mechanical ventilation in the context of her overall deconditioned state / critical illness.  Specifically, that CPR would not be in her best interest but that supporting her on mechanical ventilation short term with nutrition might give her the opportunity to recover from her acute illness.  We discussed that mechanical ventilation does not "cure" any of her illnesses but only serves as a bridge to see if she can get better and that she may decline despite these aggressive measures.  Her husband states he wants to give her the opportunity to get better if it exists.  He would request CPR as well.   Code status: Full Code  Disposition: Continue current acute care   Time spent for the meeting: 58 minutes   Noe Gens, NP-C [place] Pgr: [phone] or if no answer [phone] 10/01/2018, 4:07 PM

## 2018-10-01 NOTE — Consult Note (Signed)
   [facility] Inpatient Consult   10/01/2018  [name] [birth year] [id number]  Patient was assessed for an extreme high risk score for unplanned readmission and less than 30 day readmission.  Chart review reveals the patient has had a left above the knee amputation. Also, noted a palliative care consult.  Patient is for skilled nursing for rehab.  However, chart review reveals patient is lethargic today.  Patient's progress and disposition is uncertain. Will follow up with inpatient RNCM.  Patient listed with [employer].  For questions or referrals please contact:  Natividad Brood, RN BSN [facility] Liaison  [phone] business mobile phone Toll free office [phone]

## 2018-10-01 NOTE — Progress Notes (Signed)
Pharmacy Antibiotic Note  [name] is a 73 y.o. female admitted on 09/24/2018 with sepsis.  Pharmacy has been consulted for vancomycin dosing.  Plan: Give vanc 1g IV x 1, then start vancomycin 500mg  IV QHD-TTS Start cefepime 2g IV QHD-TTS Monitor clinical picture, renal function, preHD vanc random prn F/U C&S, abx deescalation / LOT  Height: 5' (152.4 cm) Weight: 111 lb 1.8 oz (50.4 kg) IBW/kg (Calculated) : 45.5  Temp (24hrs), Avg:99.7 F (37.6 C), Min:98.9 F (37.2 C), Max:101.5 F (38.6 C)  Recent Labs  Lab 09/24/18 1822  09/26/18 0552 09/27/18 0917 09/28/18 0328 09/29/18 0342 09/30/18 0151 09/30/18 1025 10/01/18 0244  WBC  --    < > 9.9 8.7 10.4  --   --  16.3* 18.6*  CREATININE  --    < > 2.93* 4.35* 5.38* 3.09* 4.48*  --  3.07*  LATICACIDVEN 1.35  --   --   --   --   --   --   --   --    < > = values in this interval not displayed.    Estimated Creatinine Clearance: 11.9 mL/min (A) (by C-G formula based on SCr of 3.07 mg/dL (H)).    Allergies  Allergen Reactions  . Ace Inhibitors Cough    Thank you for allowing pharmacy to be a part of this patient's care.  Reginia Naas 10/01/2018 12:50 PM

## 2018-10-02 ENCOUNTER — Inpatient Hospital Stay (HOSPITAL_COMMUNITY): Payer: Medicare Other

## 2018-10-02 DIAGNOSIS — J439 Emphysema, unspecified: Secondary | ICD-10-CM

## 2018-10-02 DIAGNOSIS — D649 Anemia, unspecified: Secondary | ICD-10-CM

## 2018-10-02 LAB — RENAL FUNCTION PANEL
Albumin: 1.5 g/dL — ABNORMAL LOW (ref 3.5–5.0)
Anion gap: 12 (ref 5–15)
BUN: 43 mg/dL — ABNORMAL HIGH (ref 8–23)
CALCIUM: 8.4 mg/dL — AB (ref 8.9–10.3)
CO2: 24 mmol/L (ref 22–32)
Chloride: 104 mmol/L (ref 98–111)
Creatinine, Ser: 4.45 mg/dL — ABNORMAL HIGH (ref 0.44–1.00)
GFR calc non Af Amer: 9 mL/min — ABNORMAL LOW (ref >60)
GFR, EST AFRICAN AMERICAN: 11 mL/min — AB (ref >60)
Glucose, Bld: 141 mg/dL — ABNORMAL HIGH (ref 70–99)
Phosphorus: 4.2 mg/dL (ref 2.5–4.6)
Potassium: 5.4 mmol/L — ABNORMAL HIGH (ref 3.5–5.1)
Sodium: 140 mmol/L (ref 135–145)

## 2018-10-02 LAB — CBC
HCT: 28.5 % — ABNORMAL LOW (ref 36.0–46.0)
HCT: 30.1 % — ABNORMAL LOW (ref 36.0–46.0)
HEMATOCRIT: 30.6 % — AB (ref 36.0–46.0)
HEMOGLOBIN: 8.9 g/dL — AB (ref 12.0–15.0)
Hemoglobin: 8.5 g/dL — ABNORMAL LOW (ref 12.0–15.0)
Hemoglobin: 9 g/dL — ABNORMAL LOW (ref 12.0–15.0)
MCH: 26.8 pg (ref 26.0–34.0)
MCH: 27.1 pg (ref 26.0–34.0)
MCH: 27 pg (ref 26.0–34.0)
MCHC: 29.1 g/dL — ABNORMAL LOW (ref 30.0–36.0)
MCHC: 29.8 g/dL — ABNORMAL LOW (ref 30.0–36.0)
MCHC: 29.9 g/dL — ABNORMAL LOW (ref 30.0–36.0)
MCV: 92.2 fL (ref 80.0–100.0)
MCV: 90.8 fL (ref 80.0–100.0)
MCV: 90.4 fL (ref 80.0–100.0)
Platelets: 311 10*3/uL (ref 150–400)
Platelets: 284 10*3/uL (ref 150–400)
Platelets: 238 10*3/uL (ref 150–400)
RBC: 3.32 MIL/uL — ABNORMAL LOW (ref 3.87–5.11)
RBC: 3.14 MIL/uL — ABNORMAL LOW (ref 3.87–5.11)
RBC: 3.33 MIL/uL — ABNORMAL LOW (ref 3.87–5.11)
RDW: 18.4 % — ABNORMAL HIGH (ref 11.5–15.5)
RDW: 18.5 % — ABNORMAL HIGH (ref 11.5–15.5)
RDW: 18.6 % — ABNORMAL HIGH (ref 11.5–15.5)
WBC: 15.9 10*3/uL — ABNORMAL HIGH (ref 4.0–10.5)
WBC: 19.5 10*3/uL — ABNORMAL HIGH (ref 4.0–10.5)
WBC: 18.7 10*3/uL — ABNORMAL HIGH (ref 4.0–10.5)
nRBC: 0 % (ref 0.0–0.2)
nRBC: 0 % (ref 0.0–0.2)
nRBC: 0.5 % — ABNORMAL HIGH (ref 0.0–0.2)

## 2018-10-02 LAB — GLUCOSE, CAPILLARY
GLUCOSE-CAPILLARY: 95 mg/dL (ref 70–99)
Glucose-Capillary: 115 mg/dL — ABNORMAL HIGH (ref 70–99)
Glucose-Capillary: 119 mg/dL — ABNORMAL HIGH (ref 70–99)
Glucose-Capillary: 137 mg/dL — ABNORMAL HIGH (ref 70–99)
Glucose-Capillary: 141 mg/dL — ABNORMAL HIGH (ref 70–99)
Glucose-Capillary: 163 mg/dL — ABNORMAL HIGH (ref 70–99)

## 2018-10-02 LAB — LIPASE, BLOOD: Lipase: 19 U/L (ref 11–51)

## 2018-10-02 LAB — TSH: TSH: 2.8 u[IU]/mL (ref 0.350–4.500)

## 2018-10-02 MED ORDER — SODIUM CHLORIDE 0.9 % IV SOLN: 100.0000 mL | INTRAVENOUS | Status: DC | PRN

## 2018-10-02 MED ORDER — LIDOCAINE HCL (PF) 1 % IJ SOLN: 5.0000 mL | INTRAMUSCULAR | Status: DC | PRN

## 2018-10-02 MED ORDER — PHENYLEPHRINE HCL-NACL 10-0.9 MG/250ML-% IV SOLN
0.0000 ug/min | INTRAVENOUS | Status: DC
  Administered 2018-10-03: 20 ug/min via INTRAVENOUS
  Filled 2018-10-02: qty 250

## 2018-10-02 MED ORDER — LIDOCAINE-PRILOCAINE 2.5-2.5 % EX CREA
1.0000 "application " | TOPICAL_CREAM | CUTANEOUS | Status: DC | PRN
  Filled 2018-10-02: qty 5

## 2018-10-02 MED ORDER — PENTAFLUOROPROP-TETRAFLUOROETH EX AERO: 1.0000 "application " | INHALATION_SPRAY | CUTANEOUS | Status: DC | PRN

## 2018-10-02 MED ORDER — HEPARIN SODIUM (PORCINE) 1000 UNIT/ML DIALYSIS
1000.0000 [IU] | INTRAMUSCULAR | Status: DC | PRN
  Administered 2018-10-02: 3200 [IU] via INTRAVENOUS_CENTRAL
  Filled 2018-10-02 (×5): qty 1

## 2018-10-02 MED ORDER — HEPARIN SODIUM (PORCINE) 1000 UNIT/ML DIALYSIS
20.0000 [IU]/kg | INTRAMUSCULAR | Status: DC | PRN
  Filled 2018-10-02 (×5): qty 1

## 2018-10-02 MED ORDER — ALTEPLASE 2 MG IJ SOLR: 2.0000 mg | Freq: Once | INTRAMUSCULAR | Status: DC | PRN

## 2018-10-02 MED ORDER — PANTOPRAZOLE SODIUM 40 MG IV SOLR
40.0000 mg | Freq: Two times a day (BID) | INTRAVENOUS | Status: DC
  Administered 2018-10-02 – 2018-10-10 (×16): 40 mg via INTRAVENOUS
  Filled 2018-10-02 (×16): qty 40

## 2018-10-02 NOTE — Progress Notes (Signed)
[facility] Progress Note   Subjective:   Resp failure overnight, intubated  40% FIO2  No pressors; BP stable  K 5.4 this AM, HCo3 24  No famiy in room   Objective Vitals:   10/02/18 0400 10/02/18 0420 10/02/18 0500 10/02/18 0600  BP: (!) 129/49  (!) 134/47 (!) 123/43  Pulse: 84  84 84  Resp: _0 Temp: 98.5 F (36.9 C)     TempSrc: Oral     SpO2: 100% 100% 98% 99%  Weight:      Height:       Physical Exam  General: Intubated, chronically ill appearing elderly female Heart: RRR 2/6 SEM  Lungs: Loud upper airway sounds heard throughout Abdomen: soft NT/ND Extremities: bilat amp. NO stump edema Dialysis Access: R chest TDC  Dialysis Orders:  [facility] TTS  3.5hr EDW 61kg 180NRe 400/800  2/2 180NRe 400/800  Heparin 1850 unit bolus  Mircera 225 q2weeks (last 09/14/18)   Assessment/Plan:  VDRF, intubated 10/01/17; progressive weakness, ? Aspiration; per CCM 1.s/p Left AKAdue to infection/gangrene 12/29 Dr. Donnetta Hutching  2. ESRD- HD TTS. Attempt HD today in ICU 3. Anemia- Hgb 8.9 Aranesp 200 q Saturday; CTM 4. Secondary hyperparathyroidism-No VDRA - On phoslo 667 TID.  5.HTN/volume-BP stable, start with goal UF 2L on today. Well below EDW here.  6. Nutrition/PCM. Very low albumin. Poor PO intake  supplements/renal vit. 7. SLE - plaquenil/solulcortef 8. DM per primarySSI 9. Access needs- has 2nd stage BVT left arm which has occluded - appreciate Dr. Donnetta Hutching - assistance - for upper left arm AVG in the future when current medical issues resolved. [place] - current meds 11.Afib- amiodarone/resumed apixaban. 12. Abd pain - some chronicity -  Dispo: Palliative care consult pending for [place]   10/02/2018,7:22 AM  LOS: 8 days   Additional Objective Labs: Basic Metabolic Panel: Recent Labs  Lab 09/30/18 0151 10/01/18 0244 10/02/18 0333  NA 137 140 140  K 3.9 4.9 5.4*  CL 100 100 104  CO2 _1 GLUCOSE 237* 169* 141*  BUN 40* 22 43*   CREATININE 4.48* 3.07* 4.45*  CALCIUM 7.9* 8.3* 8.4*  PHOS 4.8* 3.4 4.2   CBC: Recent Labs  Lab 09/27/18 0917 09/28/18 0328 09/30/18 1025 10/01/18 0244 10/02/18 0333  WBC 8.7 10.4 16.3* 18.6* 15.9*  HGB 8.3* 8.8* 8.1* 9.7* 8.9*  HCT 28.4* 30.3* 27.6* 32.4* 30.6*  MCV 92.8 93.2 92.6 91.0 92.2  PLT 205 200 296 299 311   Blood Culture    Component Value Date/Time   SDES TRACHEAL ASPIRATE 10/01/2018 1746   SPECREQUEST NONE 10/01/2018 1746   CULT PENDING 10/01/2018 1746   REPTSTATUS PENDING 10/01/2018 1746    Cardiac Enzymes: No results for input(s): CKTOTAL, CKMB, CKMBINDEX, TROPONINI in the last 168 hours. CBG: Recent Labs  Lab 10/01/18 0625 10/01/18 1151 10/01/18 2052 10/01/18 2354 10/02/18 0455  GLUCAP 175* 209* 223* 145* 137*   Iron Studies: No results for input(s): IRON, TIBC, TRANSFERRIN, FERRITIN in the last 72 hours. Lab Results  Component Value Date   INR 1.45 10/01/2018   INR 1.17 09/26/2018   INR 1.18 06/08/2018   Medications: . sodium chloride    . acetaminophen Stopped (10/01/18 1434)  . ceFEPime (MAXIPIME) IV    . fentaNYL infusion INTRAVENOUS 50 mcg/hr (10/02/18 0600)  . fluconazole (DIFLUCAN) IV    . metronidazole 100 mL/hr at 10/02/18 0600  . phenylephrine (NEO-SYNEPHRINE) Adult infusion Stopped (10/01/18 1830)  . vancomycin     .  amiodarone  200 mg Per Tube Daily  . apixaban  2.5 mg Per Tube BID  . brimonidine  1 drop Left Eye BID   And  . timolol  1 drop Left Eye BID  . calcium acetate  667 mg Oral TID WC  . carBAMazepine  100 mg Per Tube BID  . chlorhexidine gluconate (MEDLINE KIT)  15 mL Mouth Rinse BID  . Chlorhexidine Gluconate Cloth  6 each Topical Q0600  . darbepoetin (ARANESP) injection - DIALYSIS  200 mcg Intravenous Q Sat-HD  . hydrocortisone sod succinate (SOLU-CORTEF) inj  50 mg Intravenous Q6H  . insulin aspart  0-9 Units Subcutaneous Q4H  . ipratropium-albuterol  3 mL Nebulization BID  . latanoprost  1 drop Both Eyes  QHS  . mouth rinse  15 mL Mouth Rinse 10 times per day  . pantoprazole (PROTONIX) IV  40 mg Intravenous Q24H  . sodium chloride flush  3 mL Intravenous Q12H

## 2018-10-02 NOTE — Plan of Care (Signed)
  Problem: Education: Goal: Knowledge of General Education information will improve Description Including pain rating scale, medication(s)/side effects and non-pharmacologic comfort measures Outcome: Not Progressing   Problem: Education: Goal: Knowledge of General Education information will improve Description Including pain rating scale, medication(s)/side effects and non-pharmacologic comfort measures Outcome: Not Progressing   Problem: Health Behavior/Discharge Planning: Goal: Ability to manage health-related needs will improve Outcome: Not Progressing   Problem: Clinical Measurements: Goal: Ability to maintain clinical measurements within normal limits will improve Outcome: Not Progressing Goal: Will remain free from infection Outcome: Not Progressing Goal: Diagnostic test results will improve Outcome: Not Progressing Goal: Respiratory complications will improve Outcome: Not Progressing Goal: Cardiovascular complication will be avoided Outcome: Not Progressing   Problem: Activity: Goal: Risk for activity intolerance will decrease Outcome: Not Progressing   Problem: Nutrition: Goal: Adequate nutrition will be maintained Outcome: Not Progressing   Problem: Coping: Goal: Level of anxiety will decrease Outcome: Not Progressing   Problem: Elimination: Goal: Will not experience complications related to bowel motility Outcome: Not Progressing Goal: Will not experience complications related to urinary retention Outcome: Not Progressing   Problem: Pain Managment: Goal: General experience of comfort will improve Outcome: Not Progressing   Problem: Safety: Goal: Ability to remain free from injury will improve Outcome: Not Progressing   Problem: Skin Integrity: Goal: Risk for impaired skin integrity will decrease Outcome: Not Progressing

## 2018-10-02 NOTE — Progress Notes (Signed)
Initial Nutrition Assessment  DOCUMENTATION CODES:   Not applicable  INTERVENTION:  - If patient to remain intubated >/= 24 hours, recommend Vital AF 1.2 @ 45 mL/hr to provide 1296 kcal (97% estimated kcal need), 81 grams of protein, and 876 mL free water.  - Will monitor lytes closely and change to renal-friendly formula, if needed.   NUTRITION DIAGNOSIS:   Inadequate oral intake related to inability to eat as evidenced by NPO status.  GOAL:   Patient will meet greater than or equal to 90% of their needs  MONITOR:   Vent status, Weight trends, Labs, Skin  REASON FOR ASSESSMENT:   Ventilator  ASSESSMENT:    73 y.o. female with medical history significant of HTN, hyperlipidemia, DM, COPD, GERD, gout, anemia, atrial fib and DVT on Eliquis, CHF, ESRD-HD (TTS), lupus, PAD, s/p R AKA. She presented on 12/27 with L foot pain. Patient reported having L transmetatarsal amputation on 09/06/18. There was concern for gangrene in the foot. She underwent L AKA on 12/29. Subsequent course complicated by poor p.o. intake in the setting malnutrition, oral thrush and right flank pain. On 1/3 she was progressively weak, unable to protect airway adequately or clear secretions at all.  Developing some degree of encephalopathy and showing evidence for respiratory failure and was intubated.  BMI indicates normal weight. Patient intubated with OGT in place. She was awake and tracked RD around the room. No family/visitors present to provide PTA information.   Patient is currently intubated on ventilator support MV: 6.4 L/min Temp (24hrs), Avg:99.4 F (37.4 C), Min:98.1 F (36.7 C), Max:101.5 F (38.6 C) Propofol: none BP: 137/43 and MAP: 70  Medications reviewed; 667 mg Phoslo TID, 50 mg solu-cortef QID, sliding scale novolog. Labs reviewed; K: 5.4 mmol/L, BUN: 43 mg/dL, creatinine: 4.45 mg/dL, Ca: 8.4 mg/dL, GFR: 11 mL/min. Phos: 3.4 mg/dL on 1/3.     NUTRITION - FOCUSED PHYSICAL EXAM:   Most Recent Value  Orbital Region  No depletion  Upper Arm Region  No depletion  Thoracic and Lumbar Region  Unable to assess  Buccal Region  No depletion  Temple Region  Mild depletion  Clavicle Bone Region  No depletion  Clavicle and Acromion Bone Region  No depletion  Scapular Bone Region  Unable to assess  Dorsal Hand  No depletion  Patellar Region  Unable to assess  Anterior Thigh Region  No depletion  Posterior Calf Region  Unable to assess  Hair  Reviewed  Eyes  Reviewed  Mouth  Unable to assess  Skin  Reviewed  Nails  Reviewed       Diet Order:   Diet Order            Diet NPO time specified  Diet effective now              EDUCATION NEEDS:   Not appropriate for education at this time  Skin:  Skin Assessment: Skin Integrity Issues: Skin Integrity Issues:: Incisions Incisions: L leg for AKA (12/29)  Last BM:  1/2  Height:   Ht Readings from Last 1 Encounters:  09/25/18 5' (1.524 m)    Weight:   Wt Readings from Last 1 Encounters:  09/30/18 50.4 kg    Ideal Body Weight:  45.45 kg  BMI:  Body mass index is 21.7 kg/m.  Estimated Nutritional Needs:   Kcal:  1336 kcal  Protein:  76-86 grams (1.5-1.7 grams/kg)  Fluid:  >/= 1.5 L/day     Jarome Matin, MS, RD, LDN,  CNSC Inpatient Clinical Dietitian Pager # [phone] After hours/weekend pager # [phone]

## 2018-10-02 NOTE — Consult Note (Signed)
NAME:  [name], MRN:  [id number], DOB:  1946-03-20, LOS: 39 ADMISSION DATE:  09/24/2018, CONSULTATION DATE: 10/01/2018 REFERRING MD: Dr. Sarajane Jews, CHIEF COMPLAINT: Acute respiratory failure  Brief History   73 year old woman with end-stage renal disease, peripheral vascular disease, diabetes, hypertension with diastolic dysfunction.  Status post left AKA 09/26/2018 for LLE ischemia and metatarsal wound.  Poor p.o. intake, progressive weakness, A. fib since the procedure.  More more lethargy and difficulty with airway protection, now with pooling secretions and increased work of breathing. PCCM consulted for evolving resp failure  History of present illness   73 year old chronically ill woman with end-stage renal disease on hemodialysis, peripheral vascular disease, COPD, diabetes, hypertension with associated diastolic dysfunction and CHF, SLE on chronic prednisone and Plaquenil, atrial fibrillation on amiodarone and Eliquis.  She has a history of a right AKA in 05/2018, more recently a left transmetatarsal amputation 09/06/2018.  She was admitted on 12/27 with gangrene of that foot.  She was treated with vancomycin and ceftriaxone, underwent left AKA on 12/29.  Subsequent course complicated by poor p.o. intake in the setting malnutrition, oral thrush and right flank pain.  Significant difficulty with secretion clearance, evolving poor cough and increased work of breathing.  Was felt that she was experiencing an overall decline and palliative care medicine was consulted.  On 1/3 she was progressively weak, unable to protect airway adequately or clear secretions at all.  Developing some degree of encephalopathy and showing evidence for respiratory failure. Patient's stated wishes are to undergo all potential life-saving therapies, confirmed to me by her husband at bedside today.  We performed NT suctioning and obtained copious thick green secretions were posterior pharynx. Plan intubation for airway  protection and MV.   Past Medical History   has a past medical history of Anemia, Arthritis, Asthma, Atrial fibrillation ([place]), Atrial flutter ([place]), Bone spur of toe, Chronic diastolic CHF (congestive heart failure) ([place]), Colon polyposis, COPD (chronic obstructive pulmonary disease) ([place]), DVT of upper extremity (deep vein thrombosis) ([place]), ESRD (end stage renal disease) on dialysis [facility]), Essential hypertension, benign, Fractures, Glaucoma, Lupus ([place]), Pericarditis, SLE (systemic lupus erythematosus) ([place]), Type 2 diabetes mellitus ([place]), UTI (lower urinary tract infection), and Wound of right leg (06/2017).   Significant Hospital Events   12/27 admission with left lower extremity gangrenous wound 12/29 left AKA 1/3 transfer to ICU and intubated 1/4 OGT with bright red blood  Consults:  Vascular surgery Nephrology Palliative care medicine  Procedures:  LUE fistula (pre-admit) R [place] tunneled HD catheter (pre-admit) Left AKA 12/29  Significant Diagnostic Tests:  Head CT 12/27 >> stable small left parafalcine meningioma, chronic small vessel disease, nothing acute Right hip/pelvis x-ray 12/28 >> no pelvic or hip fracture, mild to moderate DJD of both hips EEG 1/2 >> global slowing without any epileptiform activity  Micro Data:  Blood cultures 12/27 >> negative Blood cultures 1/3 >>  Urine 1/1 >> multiple species, nondiagnostic Resp Cx 1/3>> GNR, final pending  Antimicrobials:  Cefepime 12/27 >> 1/1; 1/3 >>  Vancomycin 12/27 >> 1/1; 1/3 >> Fluconazole 1/2 >>  Flagyl 1/3 >>   Interim history/subjective:  Tolerating SBT this am however does not follow commands. OGT with frank bright red blood.  Objective   Blood pressure (!) 133/38, pulse 79, temperature 99.3 F (37.4 C), temperature source Axillary, resp. rate 14, height 5' (1.524 m), weight 50.4 kg, SpO2 99 %.    Vent Mode: CPAP;PSV FiO2 (%):  [40 %-60 %] 40 % Set Rate:  [  16 bmp-18 bmp] 16 bmp Vt Set:  [360 mL] 360  mL PEEP:  [5 cmH20] 5 cmH20 Pressure Support:  [5 cmH20] 5 cmH20 Plateau Pressure:  [17 cmH20-24 cmH20] 24 cmH20   Intake/Output Summary (Last 24 hours) at 10/02/2018 1052 Last data filed at 10/02/2018 1000 Gross per 24 hour  Intake 1385.75 ml  Output -  Net 1385.75 ml   Filed Weights   09/28/18 1054 09/30/18 0930 09/30/18 1315  Weight: 54.7 kg 52.4 kg 50.4 kg    Physical Exam: General: Ill-appearing female, awake but not interactive, no acute distress HENT: [place], AT, ETT in place, OGT with bright red blood  Eyes: EOMI, no scleral icterus Respiratory: Bilaterally coarse upper airway Cardiovascular: RRR, -M/R/G, no JVD GI: Hyperactive bowel sounds, soft, nontender Extremities: old right AKA, new left AKA with clean, dry dressing place Neuro: Awake but not interactive  CXR 10/02/18 independently reviewed by me - mild interstitial pulm edema, LLL atelectasis  Resolved Hospital Problem list     Assessment & Plan:   Acute multifactorial encephalopathy, chiefly metabolic Right forearm twitching noted earlier in hospitalization, EEG reassuring Depression ? Seizures  Awake but not following commands -Continue home carbamazepine. Indication unclear per husband -Hold Zoloft. This medication was started as an inpatient -Clinically monitor  Acute respiratory failure, appears to be principally due to progressive weakness and poor airway protection, aspiration given dysphasia.  Resp Cx 1/3 with GNR, final report pending COPD Sepsis -Continue mechanical ventilation -Broad-spectrum antibiotics were restarted 1/3; vancomycin, cefepime, flagyl -Follow-up cultures -Stress dose steroids  -Bronchodilators   UGIB in setting of anticoagulation Chronic right-sided abdominal pain OGT with frank BRB  -Trend CBC now and q6h -IV PPI BID -Remain NPO  Chronic anemia Likely related to ESRD -Weekly aranesp -Trend CBC as abvoe  End-stage renal disease Hyperkalemia, mild -HD per nephrology  recommendations. Last dialysis was 1/2. Plan for session today  Recent LLE AKA for ischemia/gangrene  Prior RLE AKA Peripheral vascular disease S/p LLE AKA on 12/29 -Wound consult  Oral candidiasis -Fluconazole T-Th-S via HD  Hypertension Chronic diastolic CHF Paroxysmal atrial fibrillation -Continue Amiodarone -DISCONTINUE apixaban in setting of bleed  Diabetes --Sliding scale insulin per protocol  SLE Outpatient regimen prednisone and Plaquenil --Hold Plaquenil given concern for possible sepsis --Hold PO prednisone. Stress dose steroids 1/3>>  3 cm right upper pole renal mass of unclear significance Not clear that any biopsy has been performed.  Question whether this may be contributing to current failure to thrive, presentation.  Consider repeat CT abdomen when stable to do so if we believe this will help with prognostication and overall goals for care.   Best practice:  Diet: NPO Pain/Anxiety/Delirium protocol (if indicated): intermittent versed, fentanyl  VAP protocol (if indicated): yes DVT prophylaxis: eliquis GI prophylaxis: PPI Glucose control: SSI Mobility: bedrest Code Status: Full  Family Communication: No family at bedside. Palliative consulted for this admission Disposition: ICU  Labs   CBC: Recent Labs  Lab 09/27/18 0917 09/28/18 0328 09/30/18 1025 10/01/18 0244 10/02/18 0333  WBC 8.7 10.4 16.3* 18.6* 15.9*  HGB 8.3* 8.8* 8.1* 9.7* 8.9*  HCT 28.4* 30.3* 27.6* 32.4* 30.6*  MCV 92.8 93.2 92.6 91.0 92.2  PLT 205 200 296 299 703    Basic Metabolic Panel: Recent Labs  Lab 09/28/18 0328 09/29/18 0342 09/30/18 0151 10/01/18 0244 10/02/18 0333  NA 136 136 137 140 140  K 3.4* 3.6 3.9 4.9 5.4*  CL 99 99 100 100 104  CO2 24 27 26  26  24  GLUCOSE 141* 115* 237* 169* 141*  BUN 49* 16 40* 22 43*  CREATININE 5.38* 3.09* 4.48* 3.07* 4.45*  CALCIUM 8.3* 8.2* 7.9* 8.3* 8.4*  PHOS 3.9 1.4* 4.8* 3.4 4.2   GFR: Estimated Creatinine Clearance: 8.2  mL/min (A) (by C-G formula based on SCr of 4.45 mg/dL (H)). Recent Labs  Lab 09/28/18 0328 09/30/18 1025 10/01/18 0244 10/01/18 1409 10/02/18 0333  WBC 10.4 16.3* 18.6*  --  15.9*  LATICACIDVEN  --   --   --  1.7  --     Liver Function Tests: Recent Labs  Lab 09/28/18 0328 09/29/18 0342 09/30/18 0151 10/01/18 0244 10/02/18 0333  ALBUMIN 1.7* 1.6* 1.5* 1.6* 1.5*   Recent Labs  Lab 10/02/18 0333  LIPASE 19   No results for input(s): AMMONIA in the last 168 hours.  ABG    Component Value Date/Time   PHART 7.367 10/01/2018 1838   PCO2ART 48.0 10/01/2018 1838   PO2ART 158.0 (H) 10/01/2018 1838   HCO3 27.5 10/01/2018 1838   TCO2 29 10/01/2018 1838   ACIDBASEDEF 5.4 (H) 11/03/2017 1025   O2SAT 99.0 10/01/2018 1838     Coagulation Profile: Recent Labs  Lab 09/26/18 0552 10/01/18 1409  INR 1.17 1.45    CBG: Recent Labs  Lab 10/01/18 1151 10/01/18 2052 10/01/18 2354 10/02/18 0455 10/02/18 0759  GLUCAP 209* 223* 145* 137* 119*      Critical care time: 72  The patient is critically ill with multiple organ systems failure and requires high complexity decision making for assessment and support, frequent evaluation and titration of therapies, application of advanced monitoring technologies and extensive interpretation of multiple databases.   Critical Care Time devoted to patient care services described in this note is  7 Minutes. This time reflects time of care of this signee Dr. Rodman Pickle. This critical care time does not reflect procedure time, or teaching time or supervisory time of PA/NP/Med student/Med Resident etc but could involve care discussion time.  Rodman Pickle, M.D. [facility] Pulmonary/Critical Care Medicine. Pager: [phone]. After hours pager: [phone].

## 2018-10-02 NOTE — Progress Notes (Signed)
[facility] Progress Note Patient Name: EDELMIRA GALLOGLY DOB: [birth year] MRN: [id number]   Date of Service  10/02/2018  HPI/Events of Note  Hypotension - BP = 107/46 with MAP = 46.   eICU Interventions  Will order: 1. Phenylephrine IV infusion. Titrate to MAP >= 60.      Intervention Category Major Interventions: Hypotension - evaluation and management  Sommer,Steven Eugene 10/02/2018, 11:23 PM

## 2018-10-03 DIAGNOSIS — Z87891 Personal history of nicotine dependence: Secondary | ICD-10-CM

## 2018-10-03 DIAGNOSIS — E1122 Type 2 diabetes mellitus with diabetic chronic kidney disease: Secondary | ICD-10-CM

## 2018-10-03 DIAGNOSIS — Z1612 Extended spectrum beta lactamase (ESBL) resistance: Secondary | ICD-10-CM

## 2018-10-03 DIAGNOSIS — A419 Sepsis, unspecified organism: Secondary | ICD-10-CM

## 2018-10-03 DIAGNOSIS — E1151 Type 2 diabetes mellitus with diabetic peripheral angiopathy without gangrene: Secondary | ICD-10-CM

## 2018-10-03 DIAGNOSIS — Z89612 Acquired absence of left leg above knee: Secondary | ICD-10-CM

## 2018-10-03 DIAGNOSIS — J449 Chronic obstructive pulmonary disease, unspecified: Secondary | ICD-10-CM

## 2018-10-03 DIAGNOSIS — Z9911 Dependence on respirator [ventilator] status: Secondary | ICD-10-CM

## 2018-10-03 DIAGNOSIS — Z992 Dependence on renal dialysis: Secondary | ICD-10-CM

## 2018-10-03 DIAGNOSIS — B370 Candidal stomatitis: Secondary | ICD-10-CM

## 2018-10-03 DIAGNOSIS — Z89611 Acquired absence of right leg above knee: Secondary | ICD-10-CM

## 2018-10-03 DIAGNOSIS — J155 Pneumonia due to Escherichia coli: Secondary | ICD-10-CM

## 2018-10-03 DIAGNOSIS — N186 End stage renal disease: Secondary | ICD-10-CM

## 2018-10-03 DIAGNOSIS — M329 Systemic lupus erythematosus, unspecified: Secondary | ICD-10-CM

## 2018-10-03 LAB — RENAL FUNCTION PANEL
Albumin: 1.5 g/dL — ABNORMAL LOW (ref 3.5–5.0)
Anion gap: 16 — ABNORMAL HIGH (ref 5–15)
BUN: 22 mg/dL (ref 8–23)
CHLORIDE: 97 mmol/L — AB (ref 98–111)
CO2: 21 mmol/L — ABNORMAL LOW (ref 22–32)
Calcium: 7.9 mg/dL — ABNORMAL LOW (ref 8.9–10.3)
Creatinine, Ser: 2.56 mg/dL — ABNORMAL HIGH (ref 0.44–1.00)
GFR calc Af Amer: 21 mL/min — ABNORMAL LOW (ref >60)
GFR calc non Af Amer: 18 mL/min — ABNORMAL LOW (ref >60)
GLUCOSE: 135 mg/dL — AB (ref 70–99)
Phosphorus: 4.2 mg/dL (ref 2.5–4.6)
Potassium: 3.6 mmol/L (ref 3.5–5.1)
Sodium: 134 mmol/L — ABNORMAL LOW (ref 135–145)

## 2018-10-03 LAB — CBC
HCT: 30.2 % — ABNORMAL LOW (ref 36.0–46.0)
HCT: 28 % — ABNORMAL LOW (ref 36.0–46.0)
HCT: 28.3 % — ABNORMAL LOW (ref 36.0–46.0)
Hemoglobin: 8.9 g/dL — ABNORMAL LOW (ref 12.0–15.0)
Hemoglobin: 8.3 g/dL — ABNORMAL LOW (ref 12.0–15.0)
Hemoglobin: 8.5 g/dL — ABNORMAL LOW (ref 12.0–15.0)
MCH: 26.3 pg (ref 26.0–34.0)
MCH: 26.4 pg (ref 26.0–34.0)
MCH: 26 pg (ref 26.0–34.0)
MCHC: 29.6 g/dL — ABNORMAL LOW (ref 30.0–36.0)
MCHC: 29.5 g/dL — AB (ref 30.0–36.0)
MCHC: 30 g/dL (ref 30.0–36.0)
MCV: 87.8 fL (ref 80.0–100.0)
MCV: 87.9 fL (ref 80.0–100.0)
MCV: 89.3 fL (ref 80.0–100.0)
Platelets: 269 10*3/uL (ref 150–400)
Platelets: 278 10*3/uL (ref 150–400)
Platelets: 283 10*3/uL (ref 150–400)
RBC: 3.38 MIL/uL — ABNORMAL LOW (ref 3.87–5.11)
RBC: 3.19 MIL/uL — ABNORMAL LOW (ref 3.87–5.11)
RBC: 3.22 MIL/uL — ABNORMAL LOW (ref 3.87–5.11)
RDW: 18.6 % — ABNORMAL HIGH (ref 11.5–15.5)
RDW: 18.2 % — ABNORMAL HIGH (ref 11.5–15.5)
RDW: 18.4 % — ABNORMAL HIGH (ref 11.5–15.5)
WBC: 26 10*3/uL — ABNORMAL HIGH (ref 4.0–10.5)
WBC: 23.6 10*3/uL — ABNORMAL HIGH (ref 4.0–10.5)
WBC: 23.9 10*3/uL — ABNORMAL HIGH (ref 4.0–10.5)
nRBC: 0.1 % (ref 0.0–0.2)
nRBC: 0.1 % (ref 0.0–0.2)
nRBC: 0.1 % (ref 0.0–0.2)

## 2018-10-03 LAB — GLUCOSE, CAPILLARY
Glucose-Capillary: 116 mg/dL — ABNORMAL HIGH (ref 70–99)
Glucose-Capillary: 126 mg/dL — ABNORMAL HIGH (ref 70–99)
Glucose-Capillary: 127 mg/dL — ABNORMAL HIGH (ref 70–99)
Glucose-Capillary: 129 mg/dL — ABNORMAL HIGH (ref 70–99)
Glucose-Capillary: 153 mg/dL — ABNORMAL HIGH (ref 70–99)
Glucose-Capillary: 154 mg/dL — ABNORMAL HIGH (ref 70–99)

## 2018-10-03 LAB — APTT
aPTT: 50 seconds — ABNORMAL HIGH (ref 24–36)
aPTT: 83 seconds — ABNORMAL HIGH (ref 24–36)

## 2018-10-03 LAB — CULTURE, RESPIRATORY W GRAM STAIN

## 2018-10-03 LAB — HEPARIN LEVEL (UNFRACTIONATED): Heparin Unfractionated: 0.62 IU/mL (ref 0.30–0.70)

## 2018-10-03 LAB — LACTIC ACID, PLASMA
Lactic Acid, Venous: 1.2 mmol/L (ref 0.5–1.9)
Lactic Acid, Venous: 1.3 mmol/L (ref 0.5–1.9)

## 2018-10-03 MED ORDER — NOREPINEPHRINE BITARTRATE 1 MG/ML IV SOLN
0.0000 ug/min | INTRAVENOUS | Status: DC
  Filled 2018-10-03: qty 4

## 2018-10-03 MED ORDER — SODIUM CHLORIDE 0.9 % IV SOLN
500.0000 mg | INTRAVENOUS | Status: DC
  Administered 2018-10-03 – 2018-10-04 (×2): 500 mg via INTRAVENOUS
  Filled 2018-10-03 (×3): qty 0.5

## 2018-10-03 MED ORDER — HEPARIN (PORCINE) 25000 UT/250ML-% IV SOLN
1050.0000 [IU]/h | INTRAVENOUS | Status: DC
  Administered 2018-10-03 – 2018-10-05 (×2): 550 [IU]/h via INTRAVENOUS
  Administered 2018-10-08: 750 [IU]/h via INTRAVENOUS
  Administered 2018-10-10: 900 [IU]/h via INTRAVENOUS
  Administered 2018-10-11 – 2018-10-12 (×2): 800 [IU]/h via INTRAVENOUS
  Administered 2018-10-15 – 2018-10-16 (×2): 900 [IU]/h via INTRAVENOUS
  Administered 2018-10-19: 1050 [IU]/h via INTRAVENOUS
  Filled 2018-10-03 (×11): qty 250

## 2018-10-03 NOTE — Consult Note (Addendum)
[facility]    Date of Admission:  09/24/2018   Total days of antibiotics: 9 (1 merrem, 3 vanco)                Reason for Consult: Sepsis, HCAP with ESBL E coli    Referring Provider: Maylene Roes   Assessment: HCAP ESBL E coli  Thrush PVD ESRD DM SLE  Plan: 1. Continue merrem 2. Aim for 8 days of merrem per prev guidelines, NEJM article.  3. Consider stopping vanco soon, her BCx are ngtd as she has defervesced. Her wound is healing well, minimal crusting medially.  4. Manage fluid (her CXR shows interstitial edema) and COPD 5.  her HD line has been in since April. Consider change?  Family updated.  Appreciate excellent nursing.   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 amiodarone  200 mg Per Tube Daily  . brimonidine  1 drop Left Eye BID   And  . timolol  1 drop Left Eye BID  . calcium acetate  667 mg Oral TID WC  . chlorhexidine gluconate (MEDLINE KIT)  15 mL Mouth Rinse BID  . Chlorhexidine  Gluconate Cloth  6 each Topical Q0600  . darbepoetin (ARANESP) injection - DIALYSIS  200 mcg Intravenous Q Sat-HD  . hydrocortisone sod succinate (SOLU-CORTEF) inj  50 mg Intravenous Q6H  . insulin aspart  0-9 Units Subcutaneous Q4H  . ipratropium-albuterol  3 mL Nebulization BID  . latanoprost  1 drop Both Eyes QHS  . mouth rinse  15 mL Mouth Rinse 10 times per day  . pantoprazole (PROTONIX) IV  40 mg Intravenous Q12H  . sodium chloride flush  3 mL Intravenous Q12H    HPI: JERNI SELMER is a 73 y.o. female with hx of COPD, DM, ESRD (HD since March, temp cath placed April per husband), PVD with previous L transmet 09-06-18 and returning to hospital 12-29 with oozing from wound, myalgias, and temp 101. She was dx as gangrene and she underwent L AKA on 12-29.  Since her surgery, she has been felt to have "failure to thrive" with poor po intake and palliative care eval. Her WBC has been worsening (10.4 on 12-31 to 23.9 on 1-5). She has been started on solumedrol on 1-3. On 1-3 she was seen by CCM for respiratory failure, increasing (green) secretions. Her temp was 101. She required intubation. A family meeting confirmed "Full code" at that time.  She was also started on  phenylephrine. Today her anbx were changed from vanco/cefepime/flagyl/fluconazole to vanco/merrem She weaned off pressors this am.   Review of Systems: Review of Systems  Unable to perform ROS: Intubated  last BM was smear last pm.   Past Medical History:  Diagnosis Date  . Anemia   . Arthritis   . Asthma   . Atrial fibrillation ([place])   . Atrial flutter ([place])    a. s/p TEE/DCCV December 2013 b. 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 Bone spur of toe    Right 5th toe  . Chronic diastolic CHF (congestive heart failure) (HCC)    a. EF 50-55% by  echo in 2015  . Colon polyposis   . COPD (chronic obstructive pulmonary disease) ([place])   . DVT of upper extremity (deep vein thrombosis) ([place])    Right basilic vein, June 4709  . ESRD (end stage renal disease) on dialysis ([place])    "TTF; [facility]; off [employer]." (09/07/2018)  . Essential hypertension, benign   . Fractures   . Glaucoma   . Lupus ([place])   . Pericarditis   . SLE (systemic lupus erythematosus) ([place])   . Type 2 diabetes mellitus ([place])   . UTI (lower urinary tract infection)   . Wound of right leg 06/2017    Social History   Tobacco Use  . Smoking status: Former Smoker    Packs/day: 1.00    Years: 30.00    Pack years: 30.00    Types: Cigarettes    Last attempt to quit: 08/29/2012    Years since quitting: 6.0  . Smokeless tobacco: Never Used  . Tobacco comment: quit 08-2012  Substance Use Topics  . Alcohol use: No    Alcohol/week: 0.0 standard drinks  . Drug use: No    Family History  Problem Relation Age of Onset  . Diabetes Brother   . Diabetes Father   . Hypertension Father   . Hypertension Sister   . Stroke Mother   . Diabetes Sister   . Hypertension Brother   . Colon cancer Neg Hx      Medications:  Scheduled: . amiodarone  200 mg Per Tube Daily  . brimonidine  1 drop Left Eye BID   And  . timolol  1 drop Left Eye BID  . calcium acetate  667 mg Oral TID WC  . chlorhexidine gluconate (MEDLINE KIT)  15 mL Mouth Rinse BID  . Chlorhexidine Gluconate Cloth  6 each Topical Q0600  . darbepoetin (ARANESP) injection - DIALYSIS  200 mcg Intravenous Q Sat-HD  . hydrocortisone sod succinate (SOLU-CORTEF) inj  50 mg Intravenous Q6H  . insulin aspart  0-9 Units Subcutaneous Q4H  . ipratropium-albuterol  3 mL Nebulization BID  . latanoprost  1 drop Both Eyes QHS  . mouth rinse  15 mL Mouth Rinse 10 times per day  . pantoprazole (PROTONIX) IV  40 mg Intravenous Q12H  . sodium chloride flush  3 mL Intravenous Q12H    Abtx:  Anti-infectives (From  admission, onward)   Start     Dose/Rate Route Frequency Ordered Stop   10/03/18 1400  meropenem (MERREM) 500 mg in sodium chloride 0.9 % 100 mL IVPB     500 mg 200 mL/hr over 30 Minutes Intravenous Every 24 hours 10/03/18 1330     10/02/18 1800  ceFEPIme (MAXIPIME) 2  g in sodium chloride 0.9 % 100 mL IVPB  Status:  Discontinued     2 g 200 mL/hr over 30 Minutes Intravenous Every T-Th-Sa (Hemodialysis) 10/01/18 1341 10/03/18 1322   10/02/18 1800  fluconazole (DIFLUCAN) IVPB 100 mg  Status:  Discontinued     100 mg 50 mL/hr over 60 Minutes Intravenous Every T-Th-Sa (Hemodialysis) 10/01/18 1532 10/03/18 1322   10/02/18 1200  vancomycin (VANCOCIN) 500 mg in sodium chloride 0.9 % 100 mL IVPB     500 mg 100 mL/hr over 60 Minutes Intravenous Every T-Th-Sa (Hemodialysis) 10/01/18 1341     10/01/18 1400  metroNIDAZOLE (FLAGYL) IVPB 500 mg  Status:  Discontinued     500 mg 100 mL/hr over 60 Minutes Intravenous Every 8 hours 10/01/18 1238 10/03/18 1322   10/01/18 1300  ceFEPIme (MAXIPIME) 2 g in sodium chloride 0.9 % 100 mL IVPB     2 g 200 mL/hr over 30 Minutes Intravenous  Once 10/01/18 1238 10/01/18 1626   10/01/18 1300  vancomycin (VANCOCIN) IVPB 1000 mg/200 mL premix     1,000 mg 200 mL/hr over 60 Minutes Intravenous  Once 10/01/18 1238 10/01/18 1541   10/01/18 0000  fluconazole (DIFLUCAN) tablet 100 mg  Status:  Discontinued     100 mg Oral Once per day on Tue Thu Sat 09/30/18 1440 10/01/18 1517   09/30/18 1530  fluconazole (DIFLUCAN) tablet 200 mg     200 mg Oral  Once 09/30/18 1440 09/30/18 1709   09/25/18 1200  vancomycin (VANCOCIN) IVPB 750 mg/150 ml premix  Status:  Discontinued     750 mg 150 mL/hr over 60 Minutes Intravenous Every T-Th-Sa (Hemodialysis) 09/24/18 2116 09/27/18 1104   09/25/18 1000  hydroxychloroquine (PLAQUENIL) tablet 200 mg  Status:  Discontinued     200 mg Oral Daily 09/25/18 0033 10/01/18 1517   09/24/18 2030  cefTRIAXone (ROCEPHIN) 1 g in sodium chloride 0.9 %  100 mL IVPB  Status:  Discontinued     1 g 200 mL/hr over 30 Minutes Intravenous Every 24 hours 09/24/18 2022 09/27/18 1104   09/24/18 2030  vancomycin (VANCOCIN) IVPB 1000 mg/200 mL premix     1,000 mg 200 mL/hr over 60 Minutes Intravenous  Once 09/24/18 2030 09/25/18 0408   09/24/18 1815  piperacillin-tazobactam (ZOSYN) IVPB 3.375 g     3.375 g 100 mL/hr over 30 Minutes Intravenous  Once 09/24/18 1803 09/24/18 1933        OBJECTIVE: Blood pressure (!) 109/40, pulse 99, temperature 98.9 F (37.2 C), temperature source Axillary, resp. rate 15, height 5' (1.524 m), weight 50.2 kg, SpO2 97 %.  Physical Exam Constitutional:      Appearance: She is ill-appearing. She is not toxic-appearing.  Eyes:     Extraocular Movements: Extraocular movements intact.  Neck:     Musculoskeletal: Neck supple.  Cardiovascular:     Rate and Rhythm: Normal rate and regular rhythm.  Pulmonary:     Effort: Pulmonary effort is normal.     Breath sounds: Rhonchi and rales present.  Chest:    Abdominal:     General: Bowel sounds are normal. There is no distension.     Palpations: Abdomen is soft.     Tenderness: There is no abdominal tenderness.  Musculoskeletal:       Legs:     Lab Results Results for orders placed or performed during the hospital encounter of 09/24/18 (from the past 48 hour(s))  Lactic acid, plasma     Status: None  Collection Time: 10/01/18  2:09 PM  Result Value Ref Range   Lactic Acid, Venous 1.7 0.5 - 1.9 mmol/L    Comment: Performed at [facility], [place] [address] [place], [place] [address]  Protime-INR     Status: Abnormal   Collection Time: 10/01/18  2:09 PM  Result Value Ref Range   Prothrombin Time 17.5 (H) 11.4 - 15.2 seconds   INR 1.45     Comment: Performed at [facility] [address], [place], [place] [address]  APTT     Status: Abnormal   Collection Time: 10/01/18  2:09 PM  Result Value Ref Range   aPTT 45 (H) 24 - 36 seconds     Comment:        IF BASELINE aPTT IS ELEVATED, SUGGEST PATIENT RISK ASSESSMENT BE USED TO DETERMINE APPROPRIATE ANTICOAGULANT THERAPY. Performed at [facility], [place] [address], [place], [place] [address]   Culture, blood (Routine X 2) w Reflex to ID Panel     Status: None (Preliminary result)   Collection Time: 10/01/18  2:10 PM  Result Value Ref Range   Specimen Description BLOOD RIGHT HAND    Special Requests      BOTTLES DRAWN AEROBIC ONLY Blood Culture results may not be optimal due to an inadequate volume of blood received in culture bottles   Culture NO GROWTH 2 DAYS    Report Status PENDING   Culture, blood (Routine X 2) w Reflex to ID Panel     Status: None (Preliminary result)   Collection Time: 10/01/18  2:18 PM  Result Value Ref Range   Specimen Description BLOOD RIGHT HAND    Special Requests      BOTTLES DRAWN AEROBIC ONLY Blood Culture adequate volume   Culture NO GROWTH 2 DAYS    Report Status PENDING   Blood gas, arterial     Status: Abnormal   Collection Time: 10/01/18  2:38 PM  Result Value Ref Range   O2 Content 2.0 L/min   Delivery systems NASAL CANNULA    pH, Arterial 7.412 7.350 - 7.450   pCO2 arterial 39.8 32.0 - 48.0 mmHg   pO2, Arterial 62.9 (L) 83.0 - 108.0 mmHg   Bicarbonate 24.9 20.0 - 28.0 mmol/L   Acid-Base Excess 0.8 0.0 - 2.0 mmol/L   O2 Saturation 91.4 %   Patient temperature 98.6    Collection site RIGHT BRACHIAL    Drawn by [phone]    Sample type ARTERIAL   Culture, respiratory (non-expectorated)     Status: None (Preliminary result)   Collection Time: 10/01/18  5:46 PM  Result Value Ref Range   Specimen Description TRACHEAL ASPIRATE    Special Requests NONE    Gram Stain      MODERATE WBC PRESENT, PREDOMINANTLY PMN ABUNDANT GRAM NEGATIVE RODS RARE GRAM POSITIVE RODS Performed at [facility], [address] [place], [place] [address]    Culture      ABUNDANT ESCHERICHIA COLI Confirmed Extended Spectrum Beta-Lactamase  Producer (ESBL).  In bloodstream infections from ESBL organisms, carbapenems are preferred over piperacillin/tazobactam. They are shown to have a lower risk of mortality.    Report Status PENDING    Organism ID, Bacteria ESCHERICHIA COLI       Susceptibility   Escherichia coli - MIC*    AMPICILLIN >=32 RESISTANT Resistant     CEFAZOLIN >=64 RESISTANT Resistant     CEFEPIME RESISTANT Resistant     CEFTAZIDIME RESISTANT Resistant  CEFTRIAXONE >=64 RESISTANT Resistant     CIPROFLOXACIN >=4 RESISTANT Resistant     GENTAMICIN <=1 SENSITIVE Sensitive     IMIPENEM <=0.25 SENSITIVE Sensitive     TRIMETH/SULFA >=320 RESISTANT Resistant     AMPICILLIN/SULBACTAM >=32 RESISTANT Resistant     PIP/TAZO <=4 SENSITIVE Sensitive     Extended ESBL POSITIVE Resistant     * ABUNDANT ESCHERICHIA COLI  I-STAT 3, arterial blood gas (G3+)     Status: Abnormal   Collection Time: 10/01/18  6:38 PM  Result Value Ref Range   pH, Arterial 7.367 7.350 - 7.450   pCO2 arterial 48.0 32.0 - 48.0 mmHg   pO2, Arterial 158.0 (H) 83.0 - 108.0 mmHg   Bicarbonate 27.5 20.0 - 28.0 mmol/L   TCO2 29 22 - 32 mmol/L   O2 Saturation 99.0 %   Acid-Base Excess 2.0 0.0 - 2.0 mmol/L   Patient temperature 99.1 F    Collection site BRACHIAL ARTERY    Sample type ARTERIAL   Glucose, capillary     Status: Abnormal   Collection Time: 10/01/18  8:52 PM  Result Value Ref Range   Glucose-Capillary 223 (H) 70 - 99 mg/dL  Glucose, capillary     Status: Abnormal   Collection Time: 10/01/18 11:54 PM  Result Value Ref Range   Glucose-Capillary 145 (H) 70 - 99 mg/dL  Renal function panel     Status: Abnormal   Collection Time: 10/02/18  3:33 AM  Result Value Ref Range   Sodium 140 135 - 145 mmol/L   Potassium 5.4 (H) 3.5 - 5.1 mmol/L   Chloride 104 98 - 111 mmol/L   CO2 24 22 - 32 mmol/L   Glucose, Bld 141 (H) 70 - 99 mg/dL   BUN 43 (H) 8 - 23 mg/dL   Creatinine, Ser 4.45 (H) 0.44 - 1.00 mg/dL   Calcium 8.4 (L) 8.9 - 10.3  mg/dL   Phosphorus 4.2 2.5 - 4.6 mg/dL   Albumin 1.5 (L) 3.5 - 5.0 g/dL   GFR calc non Af Amer 9 (L) >60 mL/min   GFR calc Af Amer 11 (L) >60 mL/min   Anion gap 12 5 - 15    Comment: Performed at [facility], [address], [place], [place] [address]  CBC     Status: Abnormal   Collection Time: 10/02/18  3:33 AM  Result Value Ref Range   WBC 15.9 (H) 4.0 - 10.5 K/uL   RBC 3.32 (L) 3.87 - 5.11 MIL/uL   Hemoglobin 8.9 (L) 12.0 - 15.0 g/dL   HCT 30.6 (L) 36.0 - 46.0 %   MCV 92.2 80.0 - 100.0 fL   MCH 26.8 26.0 - 34.0 pg   MCHC 29.1 (L) 30.0 - 36.0 g/dL   RDW 18.4 (H) 11.5 - 15.5 %   Platelets 311 150 - 400 K/uL   nRBC 0.0 0.0 - 0.2 %    Comment: Performed at [facility], [place] [address] [place], [place] [address]  Lipase, blood     Status: None   Collection Time: 10/02/18  3:33 AM  Result Value Ref Range   Lipase 19 11 - 51 U/L    Comment: Performed at [facility], [place] [address], [place], [place] [address]  TSH     Status: None   Collection Time: 10/02/18  3:33 AM  Result Value Ref Range   TSH 2.800 0.350 - 4.500 uIU/mL    Comment: Performed by a 3rd Generation  assay with a functional sensitivity of <=0.01 uIU/mL. Performed at [facility], [place] [address], [place], [place] [address]   Glucose, capillary     Status: Abnormal   Collection Time: 10/02/18  4:55 AM  Result Value Ref Range   Glucose-Capillary 137 (H) 70 - 99 mg/dL  Glucose, capillary     Status: Abnormal   Collection Time: 10/02/18  7:59 AM  Result Value Ref Range   Glucose-Capillary 119 (H) 70 - 99 mg/dL  Glucose, capillary     Status: Abnormal   Collection Time: 10/02/18 11:46 AM  Result Value Ref Range   Glucose-Capillary 141 (H) 70 - 99 mg/dL  CBC     Status: Abnormal   Collection Time: 10/02/18 11:50 AM  Result Value Ref Range   WBC 19.5 (H) 4.0 - 10.5 K/uL   RBC 3.14 (L) 3.87 - 5.11 MIL/uL   Hemoglobin 8.5 (L) 12.0 - 15.0 g/dL   HCT 28.5 (L) 36.0 - 46.0 %   MCV 90.8 80.0 -  100.0 fL   MCH 27.1 26.0 - 34.0 pg   MCHC 29.8 (L) 30.0 - 36.0 g/dL   RDW 18.5 (H) 11.5 - 15.5 %   Platelets 284 150 - 400 K/uL   nRBC 0.0 0.0 - 0.2 %    Comment: Performed at [facility], [place]. [address], [place], [place] [address]  Glucose, capillary     Status: None   Collection Time: 10/02/18  5:31 PM  Result Value Ref Range   Glucose-Capillary 95 70 - 99 mg/dL  CBC     Status: Abnormal   Collection Time: 10/02/18  6:07 PM  Result Value Ref Range   WBC 18.7 (H) 4.0 - 10.5 K/uL   RBC 3.33 (L) 3.87 - 5.11 MIL/uL   Hemoglobin 9.0 (L) 12.0 - 15.0 g/dL   HCT 30.1 (L) 36.0 - 46.0 %   MCV 90.4 80.0 - 100.0 fL   MCH 27.0 26.0 - 34.0 pg   MCHC 29.9 (L) 30.0 - 36.0 g/dL   RDW 18.6 (H) 11.5 - 15.5 %   Platelets 238 150 - 400 K/uL   nRBC 0.5 (H) 0.0 - 0.2 %    Comment: Performed at [facility], [place] [address] [place], [place] [address]  Glucose, capillary     Status: Abnormal   Collection Time: 10/02/18  7:57 PM  Result Value Ref Range   Glucose-Capillary 115 (H) 70 - 99 mg/dL  Glucose, capillary     Status: Abnormal   Collection Time: 10/02/18 11:39 PM  Result Value Ref Range   Glucose-Capillary 163 (H) 70 - 99 mg/dL  CBC     Status: Abnormal   Collection Time: 10/03/18 12:50 AM  Result Value Ref Range   WBC 26.0 (H) 4.0 - 10.5 K/uL   RBC 3.38 (L) 3.87 - 5.11 MIL/uL   Hemoglobin 8.9 (L) 12.0 - 15.0 g/dL   HCT 30.2 (L) 36.0 - 46.0 %   MCV 89.3 80.0 - 100.0 fL   MCH 26.3 26.0 - 34.0 pg   MCHC 29.5 (L) 30.0 - 36.0 g/dL   RDW 18.6 (H) 11.5 - 15.5 %   Platelets 269 150 - 400 K/uL   nRBC 0.1 0.0 - 0.2 %    Comment: Performed at [facility], [place]. [address], [place], [place] [address]  Renal function panel     Status: Abnormal   Collection Time: 10/03/18  2:04 AM  Result Value Ref Range  Sodium 134 (L) 135 - 145 mmol/L   Potassium 3.6 3.5 - 5.1 mmol/L   Chloride 97 (L) 98 - 111 mmol/L   CO2 21 (L) 22 - 32 mmol/L   Glucose, Bld 135 (H) 70 - 99 mg/dL   BUN  22 8 - 23 mg/dL   Creatinine, Ser 2.56 (H) 0.44 - 1.00 mg/dL    Comment: DELTA CHECK NOTED   Calcium 7.9 (L) 8.9 - 10.3 mg/dL   Phosphorus 4.2 2.5 - 4.6 mg/dL   Albumin 1.5 (L) 3.5 - 5.0 g/dL   GFR calc non Af Amer 18 (L) >60 mL/min   GFR calc Af Amer 21 (L) >60 mL/min   Anion gap 16 (H) 5 - 15    Comment: Performed at [facility] [address], [place], [place] [address]  Glucose, capillary     Status: Abnormal   Collection Time: 10/03/18  3:21 AM  Result Value Ref Range   Glucose-Capillary 129 (H) 70 - 99 mg/dL  CBC     Status: Abnormal   Collection Time: 10/03/18  6:14 AM  Result Value Ref Range   WBC 23.6 (H) 4.0 - 10.5 K/uL   RBC 3.19 (L) 3.87 - 5.11 MIL/uL   Hemoglobin 8.3 (L) 12.0 - 15.0 g/dL   HCT 28.0 (L) 36.0 - 46.0 %   MCV 87.8 80.0 - 100.0 fL   MCH 26.0 26.0 - 34.0 pg   MCHC 29.6 (L) 30.0 - 36.0 g/dL   RDW 18.2 (H) 11.5 - 15.5 %   Platelets 278 150 - 400 K/uL   nRBC 0.1 0.0 - 0.2 %    Comment: Performed at [facility], [place]. [address] [place], [place] [address]  Glucose, capillary     Status: Abnormal   Collection Time: 10/03/18  7:52 AM  Result Value Ref Range   Glucose-Capillary 127 (H) 70 - 99 mg/dL  Glucose, capillary     Status: Abnormal   Collection Time: 10/03/18 11:01 AM  Result Value Ref Range   Glucose-Capillary 153 (H) 70 - 99 mg/dL  CBC     Status: Abnormal   Collection Time: 10/03/18 11:18 AM  Result Value Ref Range   WBC 23.9 (H) 4.0 - 10.5 K/uL   RBC 3.22 (L) 3.87 - 5.11 MIL/uL   Hemoglobin 8.5 (L) 12.0 - 15.0 g/dL   HCT 28.3 (L) 36.0 - 46.0 %   MCV 87.9 80.0 - 100.0 fL   MCH 26.4 26.0 - 34.0 pg   MCHC 30.0 30.0 - 36.0 g/dL   RDW 18.4 (H) 11.5 - 15.5 %   Platelets 283 150 - 400 K/uL   nRBC 0.1 0.0 - 0.2 %    Comment: Performed at [facility], [place]. [address], [place], [place] [address]  Lactic acid, plasma     Status: None   Collection Time: 10/03/18 12:47 PM  Result Value Ref Range   Lactic Acid, Venous 1.2 0.5 -  1.9 mmol/L    Comment: Performed at [facility] [address], [place], [place] [address]      Component Value Date/Time   SDES TRACHEAL ASPIRATE 10/01/2018 1746   SPECREQUEST NONE 10/01/2018 1746   CULT  10/01/2018 1746    ABUNDANT ESCHERICHIA COLI Confirmed Extended Spectrum Beta-Lactamase Producer (ESBL).  In bloodstream infections from ESBL organisms, carbapenems are preferred over piperacillin/tazobactam. They are shown to have a lower risk of mortality.    REPTSTATUS PENDING 10/01/2018 1746  Dg Chest Port 1 View  Result Date: 10/02/2018 CLINICAL DATA:  Respiratory failure. EXAM: PORTABLE CHEST 1 VIEW COMPARISON:  10/01/1998 FINDINGS: Endotracheal tube remains present with the tip roughly 5 cm above the carina. Gastric decompression tube extends below the diaphragm. Tunneled dialysis catheter stable in appearance. Stable cardiac enlargement. Lungs show suggestion of potential mild interstitial edema and left lower lobe atelectasis. No pleural effusions or pneumothorax. IMPRESSION: Possible mild pulmonary interstitial edema and left lower lobe atelectasis. Electronically Signed   By: Aletta Edouard M.D.   On: 10/02/2018 08:07   Dg Chest Port 1 View  Result Date: 10/01/2018 CLINICAL DATA:  Acute respiratory failure with hypoxia. Follow-up exam. EXAM: PORTABLE CHEST 1 VIEW COMPARISON:  10/01/2018 at 1313 hours. FINDINGS: Since prior exam, an endotracheal tube has been placed. Tip projects 2.7 cm above the Carina. There is also new nasal/orogastric tube, which passes below the diaphragm well into the stomach. Stable mild cardiomegaly. Lungs demonstrate mildly prominent bronchovascular markings but no evidence of pneumonia or pulmonary edema. Right internal jugular dual-lumen tunneled central venous catheter is stable. No pneumothorax or pleural effusion. IMPRESSION: 1. New endotracheal tube and nasal/orogastric tube are both well positioned. 2. No other change from the earlier exam. No  acute findings in the lungs. Electronically Signed   By: Lajean Manes M.D.   On: 10/01/2018 17:58   Recent Results (from the past 240 hour(s))  Blood Culture (routine x 2)     Status: None   Collection Time: 09/24/18  5:19 PM  Result Value Ref Range Status   Specimen Description BLOOD RIGHT ANTECUBITAL  Final   Special Requests   Final    BOTTLES DRAWN AEROBIC AND ANAEROBIC Blood Culture adequate volume   Culture   Final    NO GROWTH 5 DAYS Performed at [facility], [address], [place], [place] [address]    Report Status 09/29/2018 FINAL  Final  Blood Culture (routine x 2)     Status: None   Collection Time: 09/24/18  5:24 PM  Result Value Ref Range Status   Specimen Description BLOOD BLOOD RIGHT HAND  Final   Special Requests   Final    BOTTLES DRAWN AEROBIC AND ANAEROBIC Blood Culture adequate volume   Culture   Final    NO GROWTH 5 DAYS Performed at [facility], [place] [address] [place], [place] [address]    Report Status 09/29/2018 FINAL  Final  Expectorated sputum assessment w rflx to resp cult     Status: None   Collection Time: 09/26/18  9:30 PM  Result Value Ref Range Status   Specimen Description SPUTUM  Final   Special Requests NONE  Final   Sputum evaluation   Final    Sputum specimen not acceptable for testing.  Please recollect.   RESULT CALLED TO, READ BACK BY AND VERIFIED WITH: RN Eilleen Kempf [phone] FCP Performed at [facility], [place] [address] [place], [place] [address]    Report Status 09/27/2018 FINAL  Final  Culture, Urine     Status: Abnormal   Collection Time: 09/29/18 11:53 PM  Result Value Ref Range Status   Specimen Description URINE, CATHETERIZED  Final   Special Requests   Final    NONE Performed at [facility], [place] [address], [place], [place] [address]    Culture MULTIPLE SPECIES PRESENT, SUGGEST RECOLLECTION (A)  Final   Report Status 10/01/2018 FINAL  Final  Culture, blood (Routine X  2) w Reflex to ID  Panel     Status: None (Preliminary result)   Collection Time: 10/01/18  2:10 PM  Result Value Ref Range Status   Specimen Description BLOOD RIGHT HAND  Final   Special Requests   Final    BOTTLES DRAWN AEROBIC ONLY Blood Culture results may not be optimal due to an inadequate volume of blood received in culture bottles   Culture NO GROWTH 2 DAYS  Final   Report Status PENDING  Incomplete  Culture, blood (Routine X 2) w Reflex to ID Panel     Status: None (Preliminary result)   Collection Time: 10/01/18  2:18 PM  Result Value Ref Range Status   Specimen Description BLOOD RIGHT HAND  Final   Special Requests   Final    BOTTLES DRAWN AEROBIC ONLY Blood Culture adequate volume   Culture NO GROWTH 2 DAYS  Final   Report Status PENDING  Incomplete  Culture, respiratory (non-expectorated)     Status: None (Preliminary result)   Collection Time: 10/01/18  5:46 PM  Result Value Ref Range Status   Specimen Description TRACHEAL ASPIRATE  Final   Special Requests NONE  Final   Gram Stain   Final    MODERATE WBC PRESENT, PREDOMINANTLY PMN ABUNDANT GRAM NEGATIVE RODS RARE GRAM POSITIVE RODS Performed at [facility], [place] [address] [place], [place] [address]    Culture   Final    ABUNDANT ESCHERICHIA COLI Confirmed Extended Spectrum Beta-Lactamase Producer (ESBL).  In bloodstream infections from ESBL organisms, carbapenems are preferred over piperacillin/tazobactam. They are shown to have a lower risk of mortality.    Report Status PENDING  Incomplete   Organism ID, Bacteria ESCHERICHIA COLI  Final      Susceptibility   Escherichia coli - MIC*    AMPICILLIN >=32 RESISTANT Resistant     CEFAZOLIN >=64 RESISTANT Resistant     CEFEPIME RESISTANT Resistant     CEFTAZIDIME RESISTANT Resistant     CEFTRIAXONE >=64 RESISTANT Resistant     CIPROFLOXACIN >=4 RESISTANT Resistant     GENTAMICIN <=1 SENSITIVE Sensitive     IMIPENEM <=0.25 SENSITIVE Sensitive     TRIMETH/SULFA >=320 RESISTANT  Resistant     AMPICILLIN/SULBACTAM >=32 RESISTANT Resistant     PIP/TAZO <=4 SENSITIVE Sensitive     Extended ESBL POSITIVE Resistant     * ABUNDANT ESCHERICHIA COLI    Microbiology: Recent Results (from the past 240 hour(s))  Blood Culture (routine x 2)     Status: None   Collection Time: 09/24/18  5:19 PM  Result Value Ref Range Status   Specimen Description BLOOD RIGHT ANTECUBITAL  Final   Special Requests   Final    BOTTLES DRAWN AEROBIC AND ANAEROBIC Blood Culture adequate volume   Culture   Final    NO GROWTH 5 DAYS Performed at [facility], [address], [place], [place] [address]    Report Status 09/29/2018 FINAL  Final  Blood Culture (routine x 2)     Status: None   Collection Time: 09/24/18  5:24 PM  Result Value Ref Range Status   Specimen Description BLOOD BLOOD RIGHT HAND  Final   Special Requests   Final    BOTTLES DRAWN AEROBIC AND ANAEROBIC Blood Culture adequate volume   Culture   Final    NO GROWTH 5 DAYS Performed at [facility], [place] [address] [place], [place] [address]    Report Status 09/29/2018 FINAL  Final  Expectorated sputum assessment w rflx to resp cult     Status: None   Collection Time: 09/26/18  9:30 PM  Result Value Ref Range Status   Specimen Description SPUTUM  Final   Special Requests NONE  Final   Sputum evaluation   Final    Sputum specimen not acceptable for testing.  Please recollect.   RESULT CALLED TO, READ BACK BY AND VERIFIED WITH: RN Eilleen Kempf [phone] FCP Performed at [facility], [place] [address] [place], [place] [address]    Report Status 09/27/2018 FINAL  Final  Culture, Urine     Status: Abnormal   Collection Time: 09/29/18 11:53 PM  Result Value Ref Range Status   Specimen Description URINE, CATHETERIZED  Final   Special Requests   Final    NONE Performed at [facility], [place] [address] [place], [place] [address]    Culture MULTIPLE SPECIES PRESENT, SUGGEST RECOLLECTION (A)  Final    Report Status 10/01/2018 FINAL  Final  Culture, blood (Routine X 2) w Reflex to ID Panel     Status: None (Preliminary result)   Collection Time: 10/01/18  2:10 PM  Result Value Ref Range Status   Specimen Description BLOOD RIGHT HAND  Final   Special Requests   Final    BOTTLES DRAWN AEROBIC ONLY Blood Culture results may not be optimal due to an inadequate volume of blood received in culture bottles   Culture NO GROWTH 2 DAYS  Final   Report Status PENDING  Incomplete  Culture, blood (Routine X 2) w Reflex to ID Panel     Status: None (Preliminary result)   Collection Time: 10/01/18  2:18 PM  Result Value Ref Range Status   Specimen Description BLOOD RIGHT HAND  Final   Special Requests   Final    BOTTLES DRAWN AEROBIC ONLY Blood Culture adequate volume   Culture NO GROWTH 2 DAYS  Final   Report Status PENDING  Incomplete  Culture, respiratory (non-expectorated)     Status: None (Preliminary result)   Collection Time: 10/01/18  5:46 PM  Result Value Ref Range Status   Specimen Description TRACHEAL ASPIRATE  Final   Special Requests NONE  Final   Gram Stain   Final    MODERATE WBC PRESENT, PREDOMINANTLY PMN ABUNDANT GRAM NEGATIVE RODS RARE GRAM POSITIVE RODS Performed at [facility], [place] [address], [place], [place] [address]    Culture   Final    ABUNDANT ESCHERICHIA COLI Confirmed Extended Spectrum Beta-Lactamase Producer (ESBL).  In bloodstream infections from ESBL organisms, carbapenems are preferred over piperacillin/tazobactam. They are shown to have a lower risk of mortality.    Report Status PENDING  Incomplete   Organism ID, Bacteria ESCHERICHIA COLI  Final      Susceptibility   Escherichia coli - MIC*    AMPICILLIN >=32 RESISTANT Resistant     CEFAZOLIN >=64 RESISTANT Resistant     CEFEPIME RESISTANT Resistant     CEFTAZIDIME RESISTANT Resistant     CEFTRIAXONE >=64 RESISTANT Resistant     CIPROFLOXACIN >=4 RESISTANT Resistant     GENTAMICIN <=1  SENSITIVE Sensitive     IMIPENEM <=0.25 SENSITIVE Sensitive     TRIMETH/SULFA >=320 RESISTANT Resistant     AMPICILLIN/SULBACTAM >=32 RESISTANT Resistant     PIP/TAZO <=4 SENSITIVE Sensitive     Extended ESBL POSITIVE Resistant     * ABUNDANT ESCHERICHIA COLI    Radiographs and labs were personally  reviewed by me.   Bobby Rumpf, MD [facility] [phone] 10/03/2018, 1:56 PM

## 2018-10-03 NOTE — Consult Note (Addendum)
NAME:  [name], MRN:  [id number], DOB:  [birth year], LOS: 80 ADMISSION DATE:  09/24/2018, CONSULTATION DATE: 10/01/2018 REFERRING MD: Dr. Sarajane Jews, CHIEF COMPLAINT: Acute respiratory failure  Brief History   73 year old woman with end-stage renal disease, peripheral vascular disease, diabetes, hypertension with diastolic dysfunction.  Status post left AKA 09/26/2018 for LLE ischemia and metatarsal wound.  Poor p.o. intake, progressive weakness, A. fib since the procedure.  More more lethargy and difficulty with airway protection, now with pooling secretions and increased work of breathing. PCCM consulted for evolving resp failure  History of present illness   73 year old chronically ill woman with end-stage renal disease on hemodialysis, peripheral vascular disease, COPD, diabetes, hypertension with associated diastolic dysfunction and CHF, SLE on chronic prednisone and Plaquenil, atrial fibrillation on amiodarone and Eliquis.  She has a history of a right AKA in 05/2018, more recently a left transmetatarsal amputation 09/06/2018.  She was admitted on 12/27 with gangrene of that foot.  She was treated with vancomycin and ceftriaxone, underwent left AKA on 12/29.  Subsequent course complicated by poor p.o. intake in the setting malnutrition, oral thrush and right flank pain.  Significant difficulty with secretion clearance, evolving poor cough and increased work of breathing.  Was felt that she was experiencing an overall decline and palliative care medicine was consulted.  On 1/3 she was progressively weak, unable to protect airway adequately or clear secretions at all.  Developing some degree of encephalopathy and showing evidence for respiratory failure. Patient's stated wishes are to undergo all potential life-saving therapies, confirmed to me by her husband at bedside today.  We performed NT suctioning and obtained copious thick green secretions were posterior pharynx. Plan intubation for airway  protection and MV.   Past Medical History   has a past medical history of Anemia, Arthritis, Asthma, Atrial fibrillation ([place]), Atrial flutter ([employer]), Bone spur of toe, Chronic diastolic CHF (congestive heart failure) ([place]), Colon polyposis, COPD (chronic obstructive pulmonary disease) ([place]), DVT of upper extremity (deep vein thrombosis) ([place]), ESRD (end stage renal disease) on dialysis [facility]), Essential hypertension, benign, Fractures, Glaucoma, Lupus ([place]), Pericarditis, SLE (systemic lupus erythematosus) ([place]), Type 2 diabetes mellitus ([place]), UTI (lower urinary tract infection), and Wound of right leg (06/2017).   Significant Hospital Events   12/27 admission with left lower extremity gangrenous wound 12/29 left AKA 1/3 transfer to ICU and intubated 1/4 OGT with bright red blood  Consults:  Vascular surgery Nephrology Palliative care medicine Infectious disease  Procedures:  LUE fistula (pre-admit) R [place] tunneled HD catheter (pre-admit) Left AKA 12/29  Significant Diagnostic Tests:  Head CT 12/27 >> stable small left parafalcine meningioma, chronic small vessel disease, nothing acute Right hip/pelvis x-ray 12/28 >> no pelvic or hip fracture, mild to moderate DJD of both hips EEG 1/2 >> global slowing without any epileptiform activity  Micro Data:  Blood cultures 12/27 >> negative Blood cultures 1/3 >>  Urine 1/1 >> multiple species, nondiagnostic Resp Cx 1/3>> ESBL E.coli  Antimicrobials:  Cefepime 12/27 >> 1/1; 1/3 >>  1/5 Vancomycin 12/27 >> 1/1; 1/3 >> 1/5 Fluconazole 1/2 >>   Flagyl 1/3 >>  1/5 Meropenem 1/5 >>  Interim history/subjective:  Became hypotensive overnight requiring initiation of Neo gtt.  Awake and responsive to suctioning by moving upper extremities. Does not follow commands  Objective   Blood pressure (!) 153/48, pulse 100, temperature 98.9 F (37.2 C), temperature source Axillary, resp. rate (!) 21, height 5' (1.524 m), weight 50.2 kg, SpO2 97 %.  Vent Mode: PRVC FiO2 (%):  [40 %] 40 % Set Rate:  [16 bmp] 16 bmp Vt Set:  [360 mL] 360 mL PEEP:  [5 cmH20] 5 cmH20 Pressure Support:  [5 cmH20-8 cmH20] 8 cmH20 Plateau Pressure:  [24 cmH20] 24 cmH20   Intake/Output Summary (Last 24 hours) at 10/03/2018 0708 Last data filed at 10/03/2018 0700 Gross per 24 hour  Intake 912.35 ml  Output 2070 ml  Net -1157.65 ml   Filed Weights   09/30/18 1315 10/02/18 1714 10/03/18 0200  Weight: 50.4 kg 50.7 kg 50.2 kg    Physical Exam: General: Ill-appearing female, awake but not interactive, no acute distress HENT: , AT, ETT in place, OGT with bright red blood  Eyes: EOMI, no scleral icterus Respiratory: Bilaterally coarse upper airway Cardiovascular: RRR, -M/R/G, no JVD GI: Normoactive bowel sounds, soft, nontender Extremities: old right AKA, new left AKA with clean, dry dressing place Neuro: Awake but not interactive  CXR 10/02/18 independently reviewed by me - mild interstitial pulm edema, LLL atelectasis  Resolved Hospital Problem list     Assessment & Plan:   Acute multifactorial encephalopathy, chiefly metabolic2/2 sepsis Right forearm twitching noted earlier in hospitalization, EEG reassuring Depression ? Seizures  Awake but not following commands. Discussed with pharmacy, they reported that carbamazepine was started within the past 2 weeks by the SNF for neuropathy. Given this new onset of encephalopathy will discontinue carbamazepine for now.  -Discontinue home carbamazepine -Hold Zoloft. This medication was started as an inpatient -Clinically monitor  Septic shock secondary to ESBL E. coli pneumonia   Acute respiratory failure COPD Sputum culture grew out ESBL -Continue mechanical ventilation -Discontinue vancomycin, cefepime and Flagyl -Start meropenem -Consult ID -Follow-up cultures -Stress dose steroids -Bronchodilators  -Continue phenylephrine and start Levophed drip  UGIB in setting of anticoagulation likely  secondary to NG tube trauma Chronic right-sided abdominal pain No BRB out of OG tube today, Hgb has been stable.  -Start heparin drip for anticoagulation for A. fib -Trend CBC now and q6h -Continue IV PPI BID .  Discontinue tomorrow if hemoglobin remains stable  Chronic anemia Likely related to ESRD -Weekly aranesp -Trend CBC as above  End-stage renal disease Hyperkalemia, mild -HD per nephrology recommendations. Last dialysis was 1/4.   Recent LLE AKA for ischemia/gangrene  Prior RLE AKA Peripheral vascular disease S/p LLE AKA on 12/29 -Wound consult  Oral candidiasis -Fluconazole T-Th-S via HD  Hypertension Chronic diastolic CHF Paroxysmal atrial fibrillation -Continue Amiodarone -Start heparin drip while inpatient -Hold apixaban   Diabetes --Sliding scale insulin per protocol  SLE Outpatient regimen prednisone and Plaquenil --Hold Plaquenil given concern for possible sepsis --Hold PO prednisone. Stress dose steroids 1/3>>  3 cm right upper pole renal mass of unclear significance Not clear that any biopsy has been performed.  Question whether this may be contributing to current failure to thrive, presentation.  Consider repeat CT abdomen when stable to do so if we believe this will help with prognostication and overall goals for care.   Best practice:  Diet: NPO Pain/Anxiety/Delirium protocol (if indicated): intermittent versed, fentanyl  VAP protocol (if indicated): yes DVT prophylaxis: Heparin GI prophylaxis: PPI Glucose control: SSI Mobility: bedrest Code Status: Full  Family Communication: No family at bedside. Palliative consulted for this admission Disposition: Remain in ICU  Labs   CBC: Recent Labs  Lab 10/01/18 0244 10/02/18 0333 10/02/18 1150 10/02/18 1807 10/03/18 0050  WBC 18.6* 15.9* 19.5* 18.7* 26.0*  HGB 9.7* 8.9* 8.5* 9.0* 8.9*  HCT 32.4*  30.6* 28.5* 30.1* 30.2*  MCV 91.0 92.2 90.8 90.4 89.3  PLT 299 311 284 238 269    Basic  Metabolic Panel: Recent Labs  Lab 09/29/18 0342 09/30/18 0151 10/01/18 0244 10/02/18 0333 10/03/18 0204  NA 136 137 140 140 134*  K 3.6 3.9 4.9 5.4* 3.6  CL 99 100 100 104 97*  CO2 27 26 26 24  21*  GLUCOSE 115* 237* 169* 141* 135*  BUN 16 40* 22 43* 22  CREATININE 3.09* 4.48* 3.07* 4.45* 2.56*  CALCIUM 8.2* 7.9* 8.3* 8.4* 7.9*  PHOS 1.4* 4.8* 3.4 4.2 4.2   GFR: Estimated Creatinine Clearance: 14.3 mL/min (A) (by C-G formula based on SCr of 2.56 mg/dL (H)). Recent Labs  Lab 10/01/18 1409 10/02/18 0333 10/02/18 1150 10/02/18 1807 10/03/18 0050  WBC  --  15.9* 19.5* 18.7* 26.0*  LATICACIDVEN 1.7  --   --   --   --     Liver Function Tests: Recent Labs  Lab 09/29/18 0342 09/30/18 0151 10/01/18 0244 10/02/18 0333 10/03/18 0204  ALBUMIN 1.6* 1.5* 1.6* 1.5* 1.5*   Recent Labs  Lab 10/02/18 0333  LIPASE 19   No results for input(s): AMMONIA in the last 168 hours.  ABG    Component Value Date/Time   PHART 7.367 10/01/2018 1838   PCO2ART 48.0 10/01/2018 1838   PO2ART 158.0 (H) 10/01/2018 1838   HCO3 27.5 10/01/2018 1838   TCO2 29 10/01/2018 1838   ACIDBASEDEF 5.4 (H) 11/03/2017 1025   O2SAT 99.0 10/01/2018 1838     Coagulation Profile: Recent Labs  Lab 10/01/18 1409  INR 1.45    CBG: Recent Labs  Lab 10/02/18 1146 10/02/18 1731 10/02/18 1957 10/02/18 2339 10/03/18 0321  GLUCAP 141* 95 115* 163* 129*     Asencion Noble, M.D. PGY1 Pager [phone] 10/03/2018 1:27 PM   I have taken an interval history, reviewed the chart and examined the patient. I agree with the resident's note, impression, and recommendations as outlined, which I have edited.  Briefly, 73 year old female with ESRD, PVD s/p right AKA admitted for left AKA on 12/29 and transferred to ICU for acute encephalopathy and acute respiratory failure requiring intubation.  S: Developed hypotension overnight requiring initiation of pressor.  Physical Exam: General: Chronically  ill-appearing female, no acute distress HENT: [place], AT, OP clear, MMM Respiratory: Diminished breath sounds bilaterally.  Bilateral rhonchi Cardiovascular: RRR, -M/R/G, no JVD GI: BS+, soft, nontender Extremities: Old right AKA, new left AKA with clean, place Neuro: Awake, does not follow commands, appropriately responsive to painful stimuli GU: Foley in place  I have independently reviewed the labs and micro data: WBC 23, Hg 8.3 (stable) Cr 2.56 (improving) Resp Cx 10/01/18 - ESBL E.coli  A/P Septic shock secondary to ESBL E. Coli -Sputum culture grew ESBL E. coli.  Blood cultures remain negative. -Transition neo-to Levophed -Broaden antibiotic coverage to meropenem -ID consulted -Continue stress dose steroids  Acute encephalopathy  Likely due to critical illness.  Of note, patient recently started on carbamazepine less than 2 weeks ago for neuropathy however adverse reactions include mental status changes. -Discontinue carbamazepine  Acute hypoxemic respiratory failure  -Remain intubated until shock has resolved -SBT daily  The patient is critically ill with multiple organ systems failure and requires high complexity decision making for assessment and support, frequent evaluation and titration of therapies, application of advanced monitoring technologies and extensive interpretation of multiple databases.   Critical Care Time devoted to patient care services described in this  note is 45 minutes. This time reflects time of care of this signee Dr. Rodman Pickle. This critical care time does not reflect procedure time, or teaching time or supervisory time of PA/NP/Med student/Med Resident etc but could involve care discussion time.  Rodman Pickle, M.D. [facility] Pulmonary/Critical Care Medicine Pager: [phone] After hours pager: [phone]

## 2018-10-03 NOTE — Progress Notes (Signed)
Pharmacy Antibiotic Note  [name] is a 73 y.o. female admitted on 09/24/2018 with sepsis.  Pharmacy has been consulted for vancomycin and cefepime dosing.  Today, respiratory culture growing MDR abundant Ecoli.   Plan: D/c vancomycin and cefepime Start Meropenem 500 mg q 24 hrs F/u LOT  Height: 5' (152.4 cm) Weight: 110 lb 10.7 oz (50.2 kg) IBW/kg (Calculated) : 45.5  Temp (24hrs), Avg:99.1 F (37.3 C), Min:97.6 F (36.4 C), Max:100.2 F (37.9 C)  Recent Labs  Lab 09/29/18 0342 09/30/18 0151  10/01/18 0244 10/01/18 1409 10/02/18 0333 10/02/18 1150 10/02/18 1807 10/03/18 0050 10/03/18 0204 10/03/18 0614 10/03/18 1118 10/03/18 1247  WBC  --   --    < > 18.6*  --  15.9* 19.5* 18.7* 26.0*  --  23.6* 23.9*  --   CREATININE 3.09* 4.48*  --  3.07*  --  4.45*  --   --   --  2.56*  --   --   --   LATICACIDVEN  --   --   --   --  1.7  --   --   --   --   --   --   --  1.2   < > = values in this interval not displayed.    Estimated Creatinine Clearance: 14.3 mL/min (A) (by C-G formula based on SCr of 2.56 mg/dL (H)).    Allergies  Allergen Reactions  . Ace Inhibitors Cough   Antimicrobials this admission:  Cefepime 1/3 >> 1/5 Vancomycin 1/3 >> 1/5 Fluconazole 1/4 > 1/5 Meropenem 1/5 >   Dose adjustments this admission:   Microbiology results:  1/3 BCx x 2: ngtd 1/3 TA - abundant Ecoli- MDR; senstv to meropenem 1/1 UCx: multiple species 1/3 Sputum: abundant GNR  12/27 BCx  - neg   Thank you for allowing pharmacy to be a part of this patient's care.  Marguerite Olea, [facility] Clinical Pharmacist Phone [phone]  10/03/2018 1:56 PM

## 2018-10-03 NOTE — Progress Notes (Signed)
[place] for hepain Indication: atrial fibrillation / DVT  Allergies  Allergen Reactions  . Ace Inhibitors Cough    Patient Measurements: TBW 60.8 kg Heparin Dosing Weight: 58 kg  Vital Signs: Temp: 100 F (37.8 C) (01/05 2200) Temp Source: Axillary (01/05 2200) BP: 154/62 (01/05 2200) Pulse Rate: 112 (01/05 2200)  Labs: Recent Labs    10/01/18 0244 10/01/18 1409 10/02/18 0333  10/03/18 0050 10/03/18 0204 10/03/18 0614 10/03/18 1118 10/03/18 1355 10/03/18 2129  HGB 9.7*  --  8.9*   < > 8.9*  --  8.3* 8.5*  --   --   HCT 32.4*  --  30.6*   < > 30.2*  --  28.0* 28.3*  --   --   PLT 299  --  311   < > 269  --  278 283  --   --   APTT  --  45*  --   --   --   --   --   --  50* 83*  LABPROT  --  17.5*  --   --   --   --   --   --   --   --   INR  --  1.45  --   --   --   --   --   --   --   --   HEPARINUNFRC  --   --   --   --   --   --   --   --  0.62  --   CREATININE 3.07*  --  4.45*  --   --  2.56*  --   --   --   --    < > = values in this interval not displayed.    Estimated Creatinine Clearance: 14.3 mL/min (A) (by C-G formula based on SCr of 2.56 mg/dL (H)).   Medical History: Past Medical History:  Diagnosis Date  . Anemia   . Arthritis   . Asthma   . Atrial fibrillation ([place])   . Atrial flutter ([place])    a. s/p TEE/DCCV December 2013 b. 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 Bone spur of toe    Right 5th toe  . Chronic diastolic CHF (congestive heart failure) ([facility])    a. EF 50-55% by echo in 2015  . Colon polyposis   . COPD (chronic obstructive pulmonary disease) ([place])   . DVT of upper extremity (deep vein thrombosis) ([place])    Right basilic vein, June 9937  . ESRD (end stage renal disease) on dialysis ([place])    "TTF; [place]; off [employer]." (09/07/2018)  . Essential hypertension, benign   . Fractures   . Glaucoma   . Lupus ([place])   . Pericarditis   . SLE (systemic lupus erythematosus) ([place])   . Type 2 diabetes mellitus ([place])   . UTI (lower urinary tract infection)   . Wound of right leg 06/2017    Assessment: 73 yo F presents with wound infection. On Eliquis PTA for DVT and Afib- last dose was 1/4 at 1000.   Eliquis held yesterday due to suspicion for GIB.  Pharmacy asked to restart anticoagulation with IV heparin today.  PM PTT therapeutic  Goal of Therapy:  Heparin level 0.3-0.7 units/ml aPTT 66-102 seconds Monitor platelets by  anticoagulation protocol: Yes   Plan:  Continue  heparin gtt at 550 units/hr  Monitor daily aPTT / heparin level, CBC, s/s of bleed  Thank you Anette Guarneri, PharmD  10/03/2018 10:24 PM

## 2018-10-03 NOTE — Plan of Care (Signed)
  Problem: Clinical Measurements: Goal: Will remain free from infection Outcome: Progressing Goal: Diagnostic test results will improve Outcome: Progressing Goal: Respiratory complications will improve Outcome: Progressing Goal: Cardiovascular complication will be avoided Outcome: Progressing   Problem: Safety: Goal: Ability to remain free from injury will improve Outcome: Progressing

## 2018-10-03 NOTE — Progress Notes (Signed)
[facility] Progress Note   Subjective:   Remains intubated  HD yesterday: 1.9 L ultrafiltration, tolerated well  30% FiO2  Intermittently requiring phenylephrine  Concern for upper GI bleed given blood in NG tube yesterday; none reported overnight  Hemoglobin fairly stable at 8.3 this morning   Objective Vitals:   10/03/18 0738 10/03/18 0740 10/03/18 0750 10/03/18 0800  BP:    (!) 143/40  Pulse:    (!) 104  Resp:    (!) 22  Temp:   98.4 F (36.9 C)   TempSrc:   Oral   SpO2: 98% 98%  99%  Weight:      Height:       Physical Exam  General: Intubated, chronically ill appearing elderly female Heart: RRR 2/6 SEM  Lungs: Loud upper airway sounds heard throughout Abdomen: soft NT/ND Extremities: bilat amp. NO stump edema Dialysis Access: R chest TDC  Dialysis Orders:  [facility] TTS  3.5hr EDW 61kg 180NRe 400/800  2/2 180NRe 400/800  Heparin 1850 unit bolus  Mircera 225 q2weeks (last 09/14/18)   Assessment/Plan:  VDRF, intubated 10/01/17; progressive weakness, ? Aspiration; per CCM 1.s/p Left AKAdue to infection/gangrene 12/29 Dr. Donnetta Hutching  2. ESRD- HD TTS.  On schedule 3. Anemia / ABLA / UGIB likely NGT trauam- Aranesp 200 q Saturday; CTM;  4. Secondary hyperparathyroidism-No VDRA - On phoslo 667 TID. P stable 5.HTN/volume-Well below EDW here.  6. Nutrition/PCM. Very low albumin. Poor PO intake  supplements/renal vit. 7. SLE - plaquenil/solulcortef  8. DM per primarySSI 9. Access needs- has 2nd stage BVT left arm which has occluded - appreciate Dr. Donnetta Hutching - assistance - for upper left arm AVG in the future when current medical issues resolved. [place] - current meds 11.Afib- amiodarone/resumed apixaban. 12. Abd pain - some chronicity -  Dispo: Palliative care consult pending for [place]; poor prognosis   Pearson Grippe MD 10/03/2018,8:55 AM  LOS: 9 days   Additional Objective Labs: Basic Metabolic Panel: Recent Labs  Lab 10/01/18 0244  10/02/18 0333 10/03/18 0204  NA 140 140 134*  K 4.9 5.4* 3.6  CL 100 104 97*  CO2 26 24 21*  GLUCOSE 169* 141* 135*  BUN 22 43* 22  CREATININE 3.07* 4.45* 2.56*  CALCIUM 8.3* 8.4* 7.9*  PHOS 3.4 4.2 4.2   CBC: Recent Labs  Lab 10/02/18 0333 10/02/18 1150 10/02/18 1807 10/03/18 0050 10/03/18 0614  WBC 15.9* 19.5* 18.7* 26.0* 23.6*  HGB 8.9* 8.5* 9.0* 8.9* 8.3*  HCT 30.6* 28.5* 30.1* 30.2* 28.0*  MCV 92.2 90.8 90.4 89.3 87.8  PLT 311 284 238 269 278   Blood Culture    Component Value Date/Time   SDES TRACHEAL ASPIRATE 10/01/2018 1746   SPECREQUEST NONE 10/01/2018 1746   CULT  10/01/2018 1746    ABUNDANT GRAM NEGATIVE RODS IDENTIFICATION AND SUSCEPTIBILITIES TO FOLLOW Performed at [facility], [place] [address], [place], [place] [address]    REPTSTATUS PENDING 10/01/2018 1746    Cardiac Enzymes: No results for input(s): CKTOTAL, CKMB, CKMBINDEX, TROPONINI in the last 168 hours. CBG: Recent Labs  Lab 10/02/18 1731 10/02/18 1957 10/02/18 2339 10/03/18 0321 10/03/18 0752  GLUCAP 95 115* 163* 129* 127*   Iron Studies: No results for input(s): IRON, TIBC, TRANSFERRIN, FERRITIN in the last 72 hours. Lab Results  Component Value Date   INR 1.45 10/01/2018   INR 1.17 09/26/2018   INR 1.18 06/08/2018   Medications: . sodium chloride 10 mL/hr at 10/03/18 0800  . sodium  chloride    . sodium chloride    . ceFEPime (MAXIPIME) IV Stopped (10/02/18 1804)  . fentaNYL infusion INTRAVENOUS 50 mcg/hr (10/03/18 0800)  . fluconazole (DIFLUCAN) IV Stopped (10/02/18 1908)  . metronidazole Stopped (10/03/18 1595)  . phenylephrine (NEO-SYNEPHRINE) Adult infusion Stopped (10/03/18 0731)  . vancomycin 500 mg (10/02/18 1601)   . amiodarone  200 mg Per Tube Daily  . brimonidine  1 drop Left Eye BID   And  . timolol  1 drop Left Eye BID  . calcium acetate  667 mg Oral TID WC  . carBAMazepine  100 mg Per Tube BID  . chlorhexidine gluconate (MEDLINE KIT)  15 mL Mouth  Rinse BID  . Chlorhexidine Gluconate Cloth  6 each Topical Q0600  . darbepoetin (ARANESP) injection - DIALYSIS  200 mcg Intravenous Q Sat-HD  . hydrocortisone sod succinate (SOLU-CORTEF) inj  50 mg Intravenous Q6H  . insulin aspart  0-9 Units Subcutaneous Q4H  . ipratropium-albuterol  3 mL Nebulization BID  . latanoprost  1 drop Both Eyes QHS  . mouth rinse  15 mL Mouth Rinse 10 times per day  . pantoprazole (PROTONIX) IV  40 mg Intravenous Q12H  . sodium chloride flush  3 mL Intravenous Q12H

## 2018-10-03 NOTE — Progress Notes (Signed)
[place] for hepain Indication: atrial fibrillation / DVT  Allergies  Allergen Reactions  . Ace Inhibitors Cough    Patient Measurements: TBW 60.8 kg Heparin Dosing Weight: 58 kg  Vital Signs: Temp: 98.9 F (37.2 C) (01/05 1206) Temp Source: Axillary (01/05 1206) BP: 109/40 (01/05 1300) Pulse Rate: 99 (01/05 1300)  Labs: Recent Labs    10/01/18 0244 10/01/18 1409 10/02/18 0333  10/03/18 0050 10/03/18 0204 10/03/18 0614 10/03/18 1118  HGB 9.7*  --  8.9*   < > 8.9*  --  8.3* 8.5*  HCT 32.4*  --  30.6*   < > 30.2*  --  28.0* 28.3*  PLT 299  --  311   < > 269  --  278 283  APTT  --  45*  --   --   --   --   --   --   LABPROT  --  17.5*  --   --   --   --   --   --   INR  --  1.45  --   --   --   --   --   --   CREATININE 3.07*  --  4.45*  --   --  2.56*  --   --    < > = values in this interval not displayed.    Estimated Creatinine Clearance: 14.3 mL/min (A) (by C-G formula based on SCr of 2.56 mg/dL (H)).   Medical History: Past Medical History:  Diagnosis Date  . Anemia   . Arthritis   . Asthma   . Atrial fibrillation ([place])   . Atrial flutter ([place])    a. s/p TEE/DCCV December 2013 b. 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 Bone spur of toe    Right 5th toe  . Chronic diastolic CHF (congestive heart failure) (H[facility])    a. EF 50-55% by echo in 2015  . Colon polyposis   . COPD (chronic obstructive pulmonary disease) ([place])   . DVT of upper extremity (deep vein thrombosis) ([place])    Right basilic vein, June 5643  . ESRD (end stage renal disease) on dialysis ([place])    "TTF; [place]; off [employer]." (09/07/2018)  . Essential hypertension, benign   . Fractures   . Glaucoma   . Lupus ([place])   . Pericarditis   . SLE (systemic lupus erythematosus) ([place])    . Type 2 diabetes mellitus ([place])   . UTI (lower urinary tract infection)   . Wound of right leg 06/2017    Assessment: 73 yo F presents with wound infection. On Eliquis PTA for DVT and Afib- last dose was 1/4 at 1000.   Eliquis held yesterday due to suspicion for GIB.  Pharmacy asked to restart anticoagulation with IV heparin today.  Goal of Therapy:  Heparin level 0.3-0.7 units/ml aPTT 66-102 seconds Monitor platelets by anticoagulation protocol: Yes   Plan:  Re-start heparin gtt at 550 units/hr  APTT at 8 hrs after restart Monitor daily aPTT / heparin level, CBC, s/s of bleed  Marguerite Olea, [facility] Clinical Pharmacist Phone [phone]  10/03/2018 1:24 PM

## 2018-10-04 DIAGNOSIS — R509 Fever, unspecified: Secondary | ICD-10-CM

## 2018-10-04 DIAGNOSIS — D72829 Elevated white blood cell count, unspecified: Secondary | ICD-10-CM

## 2018-10-04 DIAGNOSIS — Z888 Allergy status to other drugs, medicaments and biological substances status: Secondary | ICD-10-CM

## 2018-10-04 DIAGNOSIS — J9691 Respiratory failure, unspecified with hypoxia: Secondary | ICD-10-CM

## 2018-10-04 LAB — RENAL FUNCTION PANEL
ANION GAP: 17 — AB (ref 5–15)
Albumin: 1.5 g/dL — ABNORMAL LOW (ref 3.5–5.0)
BUN: 49 mg/dL — ABNORMAL HIGH (ref 8–23)
CO2: 19 mmol/L — ABNORMAL LOW (ref 22–32)
Calcium: 8.3 mg/dL — ABNORMAL LOW (ref 8.9–10.3)
Chloride: 99 mmol/L (ref 98–111)
Creatinine, Ser: 4.13 mg/dL — ABNORMAL HIGH (ref 0.44–1.00)
GFR calc Af Amer: 12 mL/min — ABNORMAL LOW (ref >60)
GFR calc non Af Amer: 10 mL/min — ABNORMAL LOW (ref >60)
Glucose, Bld: 170 mg/dL — ABNORMAL HIGH (ref 70–99)
POTASSIUM: 4.4 mmol/L (ref 3.5–5.1)
Phosphorus: 4.4 mg/dL (ref 2.5–4.6)
Sodium: 135 mmol/L (ref 135–145)

## 2018-10-04 LAB — GLUCOSE, CAPILLARY
GLUCOSE-CAPILLARY: 195 mg/dL — AB (ref 70–99)
GLUCOSE-CAPILLARY: 87 mg/dL (ref 70–99)
Glucose-Capillary: 159 mg/dL — ABNORMAL HIGH (ref 70–99)
Glucose-Capillary: 176 mg/dL — ABNORMAL HIGH (ref 70–99)
Glucose-Capillary: 121 mg/dL — ABNORMAL HIGH (ref 70–99)
Glucose-Capillary: 145 mg/dL — ABNORMAL HIGH (ref 70–99)

## 2018-10-04 LAB — APTT: aPTT: 95 seconds — ABNORMAL HIGH (ref 24–36)

## 2018-10-04 LAB — CBC
HCT: 28.5 % — ABNORMAL LOW (ref 36.0–46.0)
HEMOGLOBIN: 8.8 g/dL — AB (ref 12.0–15.0)
MCH: 26.7 pg (ref 26.0–34.0)
MCHC: 30.9 g/dL (ref 30.0–36.0)
MCV: 86.6 fL (ref 80.0–100.0)
NRBC: 0.2 % (ref 0.0–0.2)
Platelets: 331 10*3/uL (ref 150–400)
RBC: 3.29 MIL/uL — ABNORMAL LOW (ref 3.87–5.11)
RDW: 18.8 % — ABNORMAL HIGH (ref 11.5–15.5)
WBC: 31.4 10*3/uL — ABNORMAL HIGH (ref 4.0–10.5)

## 2018-10-04 LAB — HEPARIN LEVEL (UNFRACTIONATED): HEPARIN UNFRACTIONATED: 0.71 [IU]/mL — AB (ref 0.30–0.70)

## 2018-10-04 LAB — PHOSPHORUS: Phosphorus: 5.3 mg/dL — ABNORMAL HIGH (ref 2.5–4.6)

## 2018-10-04 LAB — MAGNESIUM: Magnesium: 1.9 mg/dL (ref 1.7–2.4)

## 2018-10-04 MED ORDER — CHLORHEXIDINE GLUCONATE CLOTH 2 % EX PADS
6.0000 | MEDICATED_PAD | Freq: Every day | CUTANEOUS | Status: DC
  Administered 2018-10-05: 6 via TOPICAL

## 2018-10-04 MED ORDER — ACETAMINOPHEN 160 MG/5ML PO SOLN
650.0000 mg | Freq: Four times a day (QID) | ORAL | Status: DC | PRN
  Administered 2018-10-04 – 2018-10-11 (×9): 650 mg
  Filled 2018-10-04 (×10): qty 20.3

## 2018-10-04 MED ORDER — HEPARIN SODIUM (PORCINE) 1000 UNIT/ML DIALYSIS
1800.0000 [IU] | Freq: Once | INTRAMUSCULAR | Status: DC
  Filled 2018-10-04: qty 2

## 2018-10-04 MED ORDER — VITAL AF 1.2 CAL PO LIQD
1000.0000 mL | ORAL | Status: DC
  Administered 2018-10-04 – 2018-10-08 (×3): 1000 mL

## 2018-10-04 MED ORDER — HYDROCORTISONE NA SUCCINATE PF 100 MG IJ SOLR
50.0000 mg | Freq: Two times a day (BID) | INTRAMUSCULAR | Status: DC
  Administered 2018-10-04 – 2018-10-07 (×6): 50 mg via INTRAVENOUS
  Filled 2018-10-04 (×6): qty 2

## 2018-10-04 NOTE — Progress Notes (Signed)
[name] Progress Note   Subjective:   Remains intubated  30% FiO2  Off pressors now   Objective Vitals:   10/04/18 1000 10/04/18 1100 10/04/18 1203 10/04/18 1231  BP: (!) 131/36 (!) 137/41    Pulse: 97 95 98   Resp: (!) 27 20 (!) 25   Temp: 99 F (37.2 C) 98.8 F (37.1 C) 98.6 F (37 C)   TempSrc:      SpO2: 98% 99% 99% 98%  Weight:      Height:       Physical Exam  General: Intubated, chronically ill appearing elderly female, eyes open but doesn't follow commands Heart: RRR 2/6 SEM  Lungs: clear bilat  Abdomen: soft NT/ND Extremities: bilat amp. NO stump edema Dialysis Access: R chest [facility]  Dialysis Orders:  [facility] TTS  3.5h  61kg   400/800  Hep 1800  R TDC Mircera 225 q2weeks (last 09/14/18)   Assessment:  1. s/p Left AKAdue to infection/gangrene 12/29 2. ESRD- HD TTS.  HD tomorrow on schedule 3. VDRF, intubated 10/01/17; progressive weakness, ? Aspiration; per CCM 4. Anemia / ABLA / UGIB likely NGT trauam- Aranesp 200 q Saturday; CTM;  5. Secondary hyperparathyroidism-No VDRA - On phoslo 667 TID. P stable 6. HTN/volume-Well below EDW here.  7. Nutrition/PCM. Very low albumin. Poor PO intake  supplements/renal vit. 8. SLE - plaquenil/solulcortef  9. DM per primarySSI 10. Access needs- has 2nd stage BVT left arm which has occluded - appreciate Dr. Donnetta Hutching - assistance - for upper left arm AVG in the future when current medical issues resolved. [place] - current meds 12. Afib- amiodarone/resumed apixaban. 13. Abd pain - some chronicity   P: Palliative care consult pending for [place]; poor prognosis  Pearson Grippe MD 10/04/2018,12:37 PM  LOS: 10 days   Additional Objective Labs: Basic Metabolic Panel: Recent Labs  Lab 10/02/18 0333 10/03/18 0204 10/04/18 0159  NA 140 134* 135  K 5.4* 3.6 4.4  CL 104 97* 99  CO2 24 21* 19*  GLUCOSE 141* 135* 170*  BUN 43* 22 49*  CREATININE 4.45* 2.56* 4.13*  CALCIUM 8.4* 7.9* 8.3*  PHOS  4.2 4.2 4.4   CBC: Recent Labs  Lab 10/02/18 1807 10/03/18 0050 10/03/18 0614 10/03/18 1118 10/04/18 0159  WBC 18.7* 26.0* 23.6* 23.9* 31.4*  HGB 9.0* 8.9* 8.3* 8.5* 8.8*  HCT 30.1* 30.2* 28.0* 28.3* 28.5*  MCV 90.4 89.3 87.8 87.9 86.6  PLT 238 269 278 283 331   Blood Culture    Component Value Date/Time   SDES TRACHEAL ASPIRATE 10/01/2018 1746   SPECREQUEST NONE 10/01/2018 1746   CULT  10/01/2018 1746    ABUNDANT ESCHERICHIA COLI Confirmed Extended Spectrum Beta-Lactamase Producer (ESBL).  In bloodstream infections from ESBL organisms, carbapenems are preferred over piperacillin/tazobactam. They are shown to have a lower risk of mortality.    REPTSTATUS 10/03/2018 FINAL 10/01/2018 1746    Cardiac Enzymes: No results for input(s): CKTOTAL, CKMB, CKMBINDEX, TROPONINI in the last 168 hours. CBG: Recent Labs  Lab 10/03/18 2023 10/03/18 2346 10/04/18 0335 10/04/18 0816 10/04/18 1109  GLUCAP 154* 116* 195* 159* 176*   Iron Studies: No results for input(s): IRON, TIBC, TRANSFERRIN, FERRITIN in the last 72 hours. Lab Results  Component Value Date   INR 1.45 10/01/2018   INR 1.17 09/26/2018   INR 1.18 06/08/2018   Medications: . sodium chloride Stopped (10/03/18 1700)  . sodium chloride    . sodium chloride    . fentaNYL infusion INTRAVENOUS  Stopped (10/03/18 3474)  . heparin 550 Units/hr (10/04/18 1100)  . meropenem (MERREM) IV Stopped (10/03/18 1457)  . norepinephrine (LEVOPHED) Adult infusion    . vancomycin 500 mg (10/02/18 1601)   . amiodarone  200 mg Per Tube Daily  . brimonidine  1 drop Left Eye BID   And  . timolol  1 drop Left Eye BID  . calcium acetate  667 mg Oral TID WC  . chlorhexidine gluconate (MEDLINE KIT)  15 mL Mouth Rinse BID  . Chlorhexidine Gluconate Cloth  6 each Topical Q0600  . darbepoetin (ARANESP) injection - DIALYSIS  200 mcg Intravenous Q Sat-HD  . hydrocortisone sod succinate (SOLU-CORTEF) inj  50 mg Intravenous Q12H  . insulin  aspart  0-9 Units Subcutaneous Q4H  . ipratropium-albuterol  3 mL Nebulization BID  . latanoprost  1 drop Both Eyes QHS  . mouth rinse  15 mL Mouth Rinse 10 times per day  . pantoprazole (PROTONIX) IV  40 mg Intravenous Q12H  . sodium chloride flush  3 mL Intravenous Q12H

## 2018-10-04 NOTE — Consult Note (Addendum)
NAME:  ADDALIE [facility], MRN:  [id number], DOB:  02-17-1946, LOS: 72 ADMISSION DATE:  09/24/2018, CONSULTATION DATE: 10/01/2018 REFERRING MD: Dr. Sarajane Jews, CHIEF COMPLAINT: Acute respiratory failure  Brief History   73 year old woman with end-stage renal disease, peripheral vascular disease, diabetes, hypertension with diastolic dysfunction.  Status post left AKA 09/26/2018 for LLE ischemia and metatarsal wound.  Poor p.o. intake, progressive weakness, A. fib since the procedure.  More more lethargy and difficulty with airway protection, now with pooling secretions and increased work of breathing. PCCM consulted for evolving resp failure  History of present illness   73 year old chronically ill woman with end-stage renal disease on hemodialysis, peripheral vascular disease, COPD, diabetes, hypertension with associated diastolic dysfunction and CHF, SLE on chronic prednisone and Plaquenil, atrial fibrillation on amiodarone and Eliquis.  She has a history of a right AKA in 05/2018, more recently a left transmetatarsal amputation 09/06/2018.  She was admitted on 12/27 with gangrene of that foot.  She was treated with vancomycin and ceftriaxone, underwent left AKA on 12/29.  Subsequent course complicated by poor p.o. intake in the setting malnutrition, oral thrush and right flank pain.  Significant difficulty with secretion clearance, evolving poor cough and increased work of breathing.  Was felt that she was experiencing an overall decline and palliative care medicine was consulted.  On 1/3 she was progressively weak, unable to protect airway adequately or clear secretions at all.  Developing some degree of encephalopathy and showing evidence for respiratory failure. Patient's stated wishes are to undergo all potential life-saving therapies, confirmed to me by her husband at bedside today.  We performed NT suctioning and obtained copious thick green secretions were posterior pharynx. Plan intubation for airway  protection and MV.   Past Medical History   has a past medical history of Anemia, Arthritis, Asthma, Atrial fibrillation ([place]), Atrial flutter ([place]), Bone spur of toe, Chronic diastolic CHF (congestive heart failure) ([place]), Colon polyposis, COPD (chronic obstructive pulmonary disease) ([place]), DVT of upper extremity (deep vein thrombosis) ([place]), ESRD (end stage renal disease) on dialysis [facility]), Essential hypertension, benign, Fractures, Glaucoma, Lupus ([place]), Pericarditis, SLE (systemic lupus erythematosus) ([place]), Type 2 diabetes mellitus ([place]), UTI (lower urinary tract infection), and Wound of right leg (06/2017).   Significant Hospital Events   12/27 admission with left lower extremity gangrenous wound 12/29 left AKA 1/3 transfer to ICU and intubated 1/4 OGT with bright red blood  Consults:  Vascular surgery Nephrology Palliative care medicine Infectious disease  Procedures:  LUE fistula (pre-admit) R [place] tunneled HD catheter (pre-admit) Left AKA 12/29  Significant Diagnostic Tests:  Head CT 12/27 >> stable small left parafalcine meningioma, chronic small vessel disease, nothing acute Right hip/pelvis x-ray 12/28 >> no pelvic or hip fracture, mild to moderate DJD of both hips EEG 1/2 >> global slowing without any epileptiform activity  Micro Data:  Blood cultures 12/27 >> negative Blood cultures 1/3 >>  Urine 1/1 >> multiple species, nondiagnostic Resp Cx 1/3>> ESBL E.coli  Antimicrobials:  Cefepime 12/27 >> 1/1; 1/3 >>  1/5 Vancomycin 12/27 >> 1/1; 1/3 >> 1/5 Fluconazole 1/2 >>   Flagyl 1/3 >>  1/5 Meropenem 1/5 >>  Interim history/subjective:  No acute events overnight. No family at bedside. Awake, not following commands.   Objective   Blood pressure (!) 142/40, pulse 95, temperature 98.2 F (36.8 C), resp. rate 19, height 5' (1.524 m), weight 50.2 kg, SpO2 99 %.    Vent Mode: PRVC FiO2 (%):  [30 %] 30 % Set  Rate:  [16 bmp] 16 bmp Vt Set:  [360 mL] 360 mL PEEP:   [5 cmH20] 5 cmH20 Pressure Support:  [5 cmH20-8 cmH20] 8 cmH20 Plateau Pressure:  [22 cmH20] 22 cmH20   Intake/Output Summary (Last 24 hours) at 10/04/2018 0701 Last data filed at 10/04/2018 0600 Gross per 24 hour  Intake 340.37 ml  Output 100 ml  Net 240.37 ml   Filed Weights   09/30/18 1315 10/02/18 1714 10/03/18 0200  Weight: 50.4 kg 50.7 kg 50.2 kg    Physical Exam: General: Ill-appearing female, awake but not interactive, no acute distress, intubated HENT: [place], AT, ETT in place, OGT with green output Eyes: EOMI, no scleral icterus Respiratory: Bilaterally coarse upper airway Cardiovascular: RRR, -M/R/G, no JVD GI: Normoactive bowel sounds, soft, nontender Extremities: old right AKA, new left AKA with clean, dry dressing place Neuro: Awake but not interactive  CXR 10/02/18 independently reviewed by me - mild interstitial pulm edema, LLL atelectasis  Resolved Hospital Problem list     Assessment & Plan:   Acute multifactorial encephalopathy, chiefly metabolic2/2 sepsis Right forearm twitching noted earlier in hospitalization, EEG reassuring Depression ? Seizures  Awake but not following commands. Discussed with pharmacy, they reported that carbamazepine was started within the past 2 weeks by the SNF for neuropathy. Given this new onset of encephalopathy will discontinue carbamazepine for now. CT head 12/27 showed stable small left parafalcine meningioma and chronic small vessel ischemic disease, no acute findings.   Continuing to hold home carbamazepine. Hold Zoloft. This medication was started as an inpatient -Clinically monitor  Septic shock secondary to ESBL E. coli pneumonia   Acute respiratory failure COPD Sputum culture grew out ESBL. Fever this morning to 102. WBC elevated to 31.4 today. Will continue treatment for E Coli pneumonia, trend WBCs and fever curve. Consider extubating tomorrow if these improve.  -Blood cultures NGTD x2 days -Continue mechanical  ventilation -Continue meropenem x8 days -ID following -Decrease solu-cortef to BID -Bronchodilators  -SBT daily -Continue Levophed drip PRN  UGIB in setting of anticoagulation likely secondary to NG tube trauma Chronic right-sided abdominal pain No BRB out of OG tube today, Hgb has been stable.  -Continue heparin drip for anticoagulation for A. fib -Trend CBC now and q6h -Continue IV PPI BID   Chronic anemia Likely related to ESRD -Weekly aranesp -Trend CBC as above  End-stage renal disease, HD TTS Hyperkalemia, mild -HD per nephrology recommendations. Last dialysis was 1/4.    Hypoalbuminemia: Albumin today was 1.5.  -Start tube feeds  Recent LLE AKA for ischemia/gangrene  Prior RLE AKA Peripheral vascular disease S/p LLE AKA on 12/29 -Continue wound consult  Oral candidiasis -Fluconazole T-Th-S via HD  Hypertension Chronic diastolic CHF Paroxysmal atrial fibrillation -Continue Amiodarone -Continue heparin drip while inpatient -Hold apixaban   Diabetes --Sliding scale insulin per protocol  SLE Outpatient regimen prednisone and Plaquenil --Hold Plaquenil given concern for possible sepsis --Hold PO prednisone. Decreased stress dose steroids to BID  3 cm right upper pole renal mass of unclear significance Not clear that any biopsy has been performed.  Question whether this may be contributing to current failure to thrive, presentation.  Consider repeat CT abdomen when stable to do so if we believe this will help with prognostication and overall goals for care.   Best practice:  Diet: NPO Pain/Anxiety/Delirium protocol (if indicated): intermittent versed, fentanyl  VAP protocol (if indicated): yes DVT prophylaxis: Heparin GI prophylaxis: PPI Glucose control: SSI Mobility: bedrest Code Status: Full  Family Communication:  No family at bedside. Palliative consulted for this admission Disposition: Remain in ICU    Labs   CBC: Recent Labs  Lab  10/02/18 1807 10/03/18 0050 10/03/18 0614 10/03/18 1118 10/04/18 0159  WBC 18.7* 26.0* 23.6* 23.9* 31.4*  HGB 9.0* 8.9* 8.3* 8.5* 8.8*  HCT 30.1* 30.2* 28.0* 28.3* 28.5*  MCV 90.4 89.3 87.8 87.9 86.6  PLT 238 269 278 283 154    Basic Metabolic Panel: Recent Labs  Lab 09/29/18 0342 09/30/18 0151 10/01/18 0244 10/02/18 0333 10/03/18 0204  NA 136 137 140 140 134*  K 3.6 3.9 4.9 5.4* 3.6  CL 99 100 100 104 97*  CO2 27 26 26 24  21*  GLUCOSE 115* 237* 169* 141* 135*  BUN 16 40* 22 43* 22  CREATININE 3.09* 4.48* 3.07* 4.45* 2.56*  CALCIUM 8.2* 7.9* 8.3* 8.4* 7.9*  PHOS 1.4* 4.8* 3.4 4.2 4.2   GFR: Estimated Creatinine Clearance: 14.3 mL/min (A) (by C-G formula based on SCr of 2.56 mg/dL (H)). Recent Labs  Lab 10/01/18 1409  10/03/18 0050 10/03/18 0614 10/03/18 1118 10/03/18 1247 10/03/18 1527 10/04/18 0159  WBC  --    < > 26.0* 23.6* 23.9*  --   --  31.4*  LATICACIDVEN 1.7  --   --   --   --  1.2 1.3  --    < > = values in this interval not displayed.    Liver Function Tests: Recent Labs  Lab 09/29/18 0342 09/30/18 0151 10/01/18 0244 10/02/18 0333 10/03/18 0204  ALBUMIN 1.6* 1.5* 1.6* 1.5* 1.5*   Recent Labs  Lab 10/02/18 0333  LIPASE 19   No results for input(s): AMMONIA in the last 168 hours.  ABG    Component Value Date/Time   PHART 7.367 10/01/2018 1838   PCO2ART 48.0 10/01/2018 1838   PO2ART 158.0 (H) 10/01/2018 1838   HCO3 27.5 10/01/2018 1838   TCO2 29 10/01/2018 1838   ACIDBASEDEF 5.4 (H) 11/03/2017 1025   O2SAT 99.0 10/01/2018 1838     Coagulation Profile: Recent Labs  Lab 10/01/18 1409  INR 1.45    CBG: Recent Labs  Lab 10/03/18 1101 10/03/18 1535 10/03/18 2023 10/03/18 2346 10/04/18 0335  GLUCAP 153* 126* 154* 116* 195*     Asencion Noble, M.D. PGY1 Pager [phone] 10/04/2018 7:01 AM   I have taken an interval history, reviewed the chart and examined the patient. I agree with the resident's note, impression,  and recommendations as outlined, which I have edited.  Briefly, 73 year old female with ESRD, PVD s/p right AKA admitted for left AKA on 12/29 and transferred to ICU for acute encephalopathy and acute respiratory failure requiring intubation.  S: Tmax 102.4. Currently afebrile and weaned off pressor. On minimal vent settings   Physical Exam:  Physical Exam: General: Chronically ill-appearing, intubated, no acute distress HENT: [place], AT, ETT in place Respiratory: Diminished breath sounds bilaterally. No rhonchi or wheezing Cardiovascular: RRR, -M/R/G, no JVD GI: BS+, soft, nontender Extremities: Old right AKA, new left AKA with sling in place Neuro: Awake, intermittently tracks, grimaces and reactive to painful stimuli, moves extremities x 4 Skin: Multiple pressure ulcers dressed and dry GU: Foley in place GU: Foley in place  I have independently reviewed the labs and micro data: WBC 31 (rising), Hg 8.8 (stable) Resp Cx 10/01/18 - ESBL E.coli BCx 10/01/18 - NGTD  A/P Septic shock secondary to ESBL E. Coli -Sputum culture grew ESBL E. coli.  Blood cultures 10/01/18 NGTD -Off pressor -ID  consulted: Continue Meropenem -Wean stress dose steroids  Acute encephalopathy  Likely due to critical illness.  Of note, patient recently started on carbamazepine less than 2 weeks ago for neuropathy however adverse reactions include mental status changes. -Carbamazepine discontinued yesterday -HD tomorrow  Acute hypoxemic respiratory failure  -Remain intubated for mental status -SBT daily -Will need GOC discussion prior to trial extubation in setting of altered mental status  S/p Left AKA -Contact surgery for dispo plan  Atrial fibrillation -Continue heparin gtt -Hold home eliquis  Nutrition: Tube feeds Dispo: Remain in ICU  The patient is critically ill with multiple organ systems failure and requires high complexity decision making for assessment and support, frequent evaluation  and titration of therapies, application of advanced monitoring technologies and extensive interpretation of multiple databases.   Critical Care Time devoted to patient care services described in this note is  36 Minutes. This time reflects time of care of this signee Dr. Rodman Pickle. This critical care time does not reflect procedure time, or teaching time or supervisory time of PA/NP/Med student/Med Resident etc but could involve care discussion time.  Rodman Pickle, M.D. [facility] Pulmonary/Critical Care Medicine. Pager: [phone]. After hours pager: [phone].

## 2018-10-04 NOTE — Progress Notes (Signed)
OT Cancellation Note  Patient Details Name: [name] MRN: [id number] DOB: [birth year]   Cancelled Treatment:    Reason Eval/Treat Not Completed: Medical issues which prohibited therapy(Pt is now intubated. Will follow.)  Malka So 10/04/2018, 10:40 AM  Nestor Lewandowsky, OTR/L Acute Rehabilitation Services Pager: [phone] Office: [phone]

## 2018-10-04 NOTE — Progress Notes (Addendum)
Nutrition Follow-up  DOCUMENTATION CODES:   Not applicable  INTERVENTION:   Tube feeding via OG tube: - Vital AF 1.2 @ 40 ml/hr (960 ml/day)  Tube feeding regimen provides 1152 kcal, 72 grams of protein, and 778 ml of H2O (102% of kcal needs, 96% of protein needs).  - Will monitor electrolytes and change to renal-friendly formula if needed  NUTRITION DIAGNOSIS:   Inadequate oral intake related to inability to eat as evidenced by NPO status.  Ongoing  GOAL:   Patient will meet greater than or equal to 90% of their needs  Met via TF  MONITOR:   Weight trends, Vent status, I & O's, Labs, Skin  REASON FOR ASSESSMENT:   Ventilator, Consult Enteral/tube feeding initiation and management  ASSESSMENT:    73 y.o. female with medical history significant of HTN, hyperlipidemia, DM, COPD, GERD, gout, anemia, atrial fib and DVT on Eliquis, CHF, ESRD-HD (TTS), lupus, PAD, s/p R AKA. She presented on 12/27 with L foot pain. Patient reported having L transmetatarsal amputation on 09/06/18. There was concern for gangrene in the foot. She underwent L AKA on 12/29. Subsequent course complicated by poor p.o. intake in the setting malnutrition, oral thrush and right flank pain. On 1/3 she was progressively weak, unable to protect airway adequately or clear secretions at all.  Developing some degree of encephalopathy and showing evidence for respiratory failure and was intubated.  Discussed pt with RN, plan is to hold off on extubation until tomorrow. On 1/4, pt had BRB per OG tube, concern for UGIB.  Weight down 26 lbs since admission. Suspect this is related to negative fluid status.  Spoke with Resident regarding intiation of TF. Resident approved dietitian consult to manage TF (verbal with readback order placed).  Spoke with husband at bedside who reports pt with decreased appetite and PO intake since 2017. [name] shares that pt has lost weight from 240 lbs to 120 lbs.  Noted pt  with 31 kg weight loss since 5/15. This is a 38.2% weight loss in less than 1 year which is severe and significant for timeframe. Pt is at risk for malnutrition. Will repeat NFPE at follow-up given negative fluid balance since admission.  Patient is currently intubated on ventilator support MV: 6.3 L/min Temp (24hrs), Avg:99.4 F (37.4 C), Min:98.1 F (36.7 C), Max:102.4 F (39.1 C)  Propofol: none Heparin: 5.5 ml/hr  Medications reviewed and include: Phoslo 667 mg TID, SSI, Protonix, IV antibiotics  Labs reviewed: hemoglobin 8.8 (L) CBG's: 176, 159, 195, 116, 154, 126 x 24 hours  OGT output: 100 ml x 24 hours I/O's: -3.4 L since admit  Diet Order:   Diet Order            Diet NPO time specified  Diet effective now              EDUCATION NEEDS:   Not appropriate for education at this time  Skin:  Skin Assessment: Skin Integrity Issues: DTI: left thigh (1/6), left buttocks (1/6) Stage II: ischial tuberosity (1/6) Stage III: sacrum (12/30) Incisions: L leg for AKA (12/29)  Last BM:  1/2  Height:   Ht Readings from Last 1 Encounters:  09/25/18 5' (1.524 m)    Weight:   Wt Readings from Last 1 Encounters:  10/03/18 50.2 kg    Ideal Body Weight:  45.45 kg  BMI:  Body mass index is 21.61 kg/m.  Estimated Nutritional Needs:   Kcal:  1130  Protein:  75-85 grams  Fluid:  UOP + 1000 ml    Gaynell Face, MS, RD, LDN Inpatient Clinical Dietitian Pager: [phone] Weekend/After Hours: [phone]

## 2018-10-04 NOTE — Progress Notes (Signed)
Patient ID: [name], female   DOB: [birth year], 73 y.o.   MRN: [id number]         [facility]  Date of Admission:  09/24/2018           Day 1 meropenem ASSESSMENT: She has acute fever, leukocytosis and hypoxic respiratory failure.  Her chest x-ray is rather unimpressive I do suspect that she has health care associated pneumonia.  Sputum culture is growing ESBL producing E. coli.  I will continue meropenem.  PLAN: 1. Continue meropenem  Principal Problem:   Left foot infection Active Problems:   SLE (systemic lupus erythematosus) (HCC)   Essential hypertension, benign   COPD (chronic obstructive pulmonary disease) (HCC)   Chronic diastolic heart failure (HCC)   Goals of care, counseling/discussion   Mixed hyperlipidemia   Atrial fibrillation (HCC)   Type II diabetes mellitus with peripheral autonomic neuropathy (HCC)   ESRD on dialysis (HCC)   S/P AKA (above knee amputation) unilateral, right (HCC)   Status post amputation of left foot through metatarsal bone (HCC)   GERD (gastroesophageal reflux disease)   Gout   S/P AKA (above knee amputation) bilateral (HCC)   Acute on chronic anemia   Supplemental oxygen dependent   Hypertension   Acute on chronic diastolic CHF (congestive heart failure) (HCC)   FTT (failure to thrive) in adult   Oral candidiasis   Dysphagia   S/P AKA (above knee amputation) unilateral, left ([place])   Palliative care by specialist   Scheduled Meds: . amiodarone  200 mg Per Tube Daily  . brimonidine  1 drop Left Eye BID   And  . timolol  1 drop Left Eye BID  . calcium acetate  667 mg Oral TID WC  . chlorhexidine gluconate (MEDLINE KIT)  15 mL Mouth Rinse BID  . Chlorhexidine Gluconate Cloth  6 each Topical Q0600  . darbepoetin (ARANESP) injection - DIALYSIS  200 mcg Intravenous Q Sat-HD  . hydrocortisone sod succinate (SOLU-CORTEF) inj  50 mg Intravenous Q6H  . insulin aspart  0-9 Units Subcutaneous Q4H  .  ipratropium-albuterol  3 mL Nebulization BID  . latanoprost  1 drop Both Eyes QHS  . mouth rinse  15 mL Mouth Rinse 10 times per day  . pantoprazole (PROTONIX) IV  40 mg Intravenous Q12H  . sodium chloride flush  3 mL Intravenous Q12H   Continuous Infusions: . sodium chloride Stopped (10/03/18 1700)  . sodium chloride    . sodium chloride    . fentaNYL infusion INTRAVENOUS Stopped (10/03/18 0807)  . heparin 550 Units/hr (10/04/18 1000)  . meropenem (MERREM) IV Stopped (10/03/18 1457)  . norepinephrine (LEVOPHED) Adult infusion    . vancomycin 500 mg (10/02/18 1601)   PRN Meds:.sodium chloride, sodium chloride, sodium chloride, acetaminophen (TYLENOL) oral liquid 160 mg/5 mL, albuterol, alteplase, docusate, fentaNYL, fentaNYL (SUBLIMAZE) injection, heparin, heparin, hydrALAZINE, lidocaine (PF), lidocaine-prilocaine, metoprolol tartrate, midazolam, midazolam, ondansetron, pentafluoroprop-tetrafluoroeth, sodium chloride flush  Review of Systems: Review of Systems  Unable to perform ROS: Intubated    Allergies  Allergen Reactions  . Ace Inhibitors Cough    OBJECTIVE: Vitals:   10/04/18 0829 10/04/18 0830 10/04/18 0900 10/04/18 1000  BP: (!) 153/39 (!) 153/39 (!) 144/42 (!) 131/36  Pulse: 94 95 95 97  Resp: 19 (!) 24 17 (!) 27  Temp:  98.8 F (37.1 C) 99 F (37.2 C) 99 F (37.2 C)  TempSrc:      SpO2: 99% 97% 99% 98%  Weight:  Height:       Body mass index is 21.61 kg/m.  Physical Exam Constitutional:      Comments: She is alert but does not follow commands.  Cardiovascular:     Rate and Rhythm: Normal rate and regular rhythm.     Heart sounds: No murmur.  Pulmonary:     Breath sounds: No wheezing or rhonchi.  Musculoskeletal:     Comments: Staples remain in left AKA incision.  There is no redness, drainage or other signs of inflammation.     Lab Results Lab Results  Component Value Date   WBC 31.4 (H) 10/04/2018   HGB 8.8 (L) 10/04/2018   HCT 28.5 (L)  10/04/2018   MCV 86.6 10/04/2018   PLT 331 10/04/2018    Lab Results  Component Value Date   CREATININE 4.13 (H) 10/04/2018   BUN 49 (H) 10/04/2018   NA 135 10/04/2018   K 4.4 10/04/2018   CL 99 10/04/2018   CO2 19 (L) 10/04/2018    Lab Results  Component Value Date   ALT 9 09/24/2018   AST 31 09/24/2018   ALKPHOS 72 09/24/2018   BILITOT 0.6 09/24/2018     Microbiology: Recent Results (from the past 240 hour(s))  Blood Culture (routine x 2)     Status: None   Collection Time: 09/24/18  5:19 PM  Result Value Ref Range Status   Specimen Description BLOOD RIGHT ANTECUBITAL  Final   Special Requests   Final    BOTTLES DRAWN AEROBIC AND ANAEROBIC Blood Culture adequate volume   Culture   Final    NO GROWTH 5 DAYS Performed at [facility], [address] [place], [place] [address]    Report Status 09/29/2018 FINAL  Final  Blood Culture (routine x 2)     Status: None   Collection Time: 09/24/18  5:24 PM  Result Value Ref Range Status   Specimen Description BLOOD BLOOD RIGHT HAND  Final   Special Requests   Final    BOTTLES DRAWN AEROBIC AND ANAEROBIC Blood Culture adequate volume   Culture   Final    NO GROWTH 5 DAYS Performed at [facility], [place] [address] [place], [place] [address]    Report Status 09/29/2018 FINAL  Final  Expectorated sputum assessment w rflx to resp cult     Status: None   Collection Time: 09/26/18  9:30 PM  Result Value Ref Range Status   Specimen Description SPUTUM  Final   Special Requests NONE  Final   Sputum evaluation   Final    Sputum specimen not acceptable for testing.  Please recollect.   RESULT CALLED TO, READ BACK BY AND VERIFIED WITH: RN Eilleen Kempf [phone] FCP Performed at [facility], [place] [address] [place], [place] [address]    Report Status 09/27/2018 FINAL  Final  Culture, Urine     Status: Abnormal   Collection Time: 09/29/18 11:53 PM  Result Value Ref Range Status   Specimen Description URINE,  CATHETERIZED  Final   Special Requests   Final    NONE Performed at [facility], [place] [address], [place],  [address]    Culture MULTIPLE SPECIES PRESENT, SUGGEST RECOLLECTION (A)  Final   Report Status 10/01/2018 FINAL  Final  Culture, blood (Routine X 2) w Reflex to ID Panel     Status: None (Preliminary result)   Collection Time: 10/01/18  2:10 PM  Result Value Ref Range  Status   Specimen Description BLOOD RIGHT HAND  Final   Special Requests   Final    BOTTLES DRAWN AEROBIC ONLY Blood Culture results may not be optimal due to an inadequate volume of blood received in culture bottles   Culture NO GROWTH 2 DAYS  Final   Report Status PENDING  Incomplete  Culture, blood (Routine X 2) w Reflex to ID Panel     Status: None (Preliminary result)   Collection Time: 10/01/18  2:18 PM  Result Value Ref Range Status   Specimen Description BLOOD RIGHT HAND  Final   Special Requests   Final    BOTTLES DRAWN AEROBIC ONLY Blood Culture adequate volume   Culture NO GROWTH 2 DAYS  Final   Report Status PENDING  Incomplete  Culture, respiratory (non-expectorated)     Status: None   Collection Time: 10/01/18  5:46 PM  Result Value Ref Range Status   Specimen Description TRACHEAL ASPIRATE  Final   Special Requests NONE  Final   Gram Stain   Final    MODERATE WBC PRESENT, PREDOMINANTLY PMN ABUNDANT GRAM NEGATIVE RODS RARE GRAM POSITIVE RODS Performed at [facility], [place] [address], [place], [place] [address]    Culture   Final    ABUNDANT ESCHERICHIA COLI Confirmed Extended Spectrum Beta-Lactamase Producer (ESBL).  In bloodstream infections from ESBL organisms, carbapenems are preferred over piperacillin/tazobactam. They are shown to have a lower risk of mortality.    Report Status 10/03/2018 FINAL  Final   Organism ID, Bacteria ESCHERICHIA COLI  Final      Susceptibility   Escherichia coli - MIC*    AMPICILLIN >=32 RESISTANT Resistant     CEFAZOLIN >=64 RESISTANT  Resistant     CEFEPIME RESISTANT Resistant     CEFTAZIDIME RESISTANT Resistant     CEFTRIAXONE >=64 RESISTANT Resistant     CIPROFLOXACIN >=4 RESISTANT Resistant     GENTAMICIN <=1 SENSITIVE Sensitive     IMIPENEM <=0.25 SENSITIVE Sensitive     TRIMETH/SULFA >=320 RESISTANT Resistant     AMPICILLIN/SULBACTAM >=32 RESISTANT Resistant     PIP/TAZO <=4 SENSITIVE Sensitive     Extended ESBL POSITIVE Resistant     * ABUNDANT ESCHERICHIA COLI    Michel Bickers, MD [facility] [place] [phone] pager   [phone] cell 10/04/2018, 10:53 AM

## 2018-10-04 NOTE — Plan of Care (Signed)
  Problem: Education: Goal: Knowledge of General Education information will improve Description Including pain rating scale, medication(s)/side effects and non-pharmacologic comfort measures Outcome: Not Progressing   Problem: Education: Goal: Knowledge of General Education information will improve Description Including pain rating scale, medication(s)/side effects and non-pharmacologic comfort measures Outcome: Not Progressing   Problem: Activity: Goal: Risk for activity intolerance will decrease Outcome: Not Progressing   Problem: Nutrition: Goal: Adequate nutrition will be maintained Outcome: Not Progressing   Problem: Pain Managment: Goal: General experience of comfort will improve Outcome: Progressing

## 2018-10-04 NOTE — Progress Notes (Signed)
[place] for hepain Indication: atrial fibrillation / DVT  Allergies  Allergen Reactions  . Ace Inhibitors Cough    Patient Measurements: TBW 60.8 kg Heparin Dosing Weight: 58 kg  Vital Signs: Temp: 99 F (37.2 C) (01/06 1000) Temp Source: Esophageal (01/06 0800) BP: 131/36 (01/06 1000) Pulse Rate: 97 (01/06 1000)  Labs: Recent Labs    10/01/18 1409 10/02/18 0333  10/03/18 0204 10/03/18 0614 10/03/18 1118 10/03/18 1355 10/03/18 2129 10/04/18 0159 10/04/18 0923  HGB  --  8.9*   < >  --  8.3* 8.5*  --   --  8.8*  --   HCT  --  30.6*   < >  --  28.0* 28.3*  --   --  28.5*  --   PLT  --  311   < >  --  278 283  --   --  331  --   APTT 45*  --   --   --   --   --  50* 83*  --  95*  LABPROT 17.5*  --   --   --   --   --   --   --   --   --   INR 1.45  --   --   --   --   --   --   --   --   --   HEPARINUNFRC  --   --   --   --   --   --  0.62  --   --  0.71*  CREATININE  --  4.45*  --  2.56*  --   --   --   --  4.13*  --    < > = values in this interval not displayed.    Estimated Creatinine Clearance: 8.8 mL/min (A) (by C-G formula based on SCr of 4.13 mg/dL (H)).   Medical History: Past Medical History:  Diagnosis Date  . Anemia   . Arthritis   . Asthma   . Atrial fibrillation ([place])   . Atrial flutter ([place])    a. s/p TEE/DCCV December 2013 b. 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 Bone spur of toe    Right 5th toe  . Chronic diastolic CHF (congestive heart failure) (HCC)    a. EF 50-55% by echo in 2015  . Colon polyposis   . COPD (chronic obstructive pulmonary disease) ([place])   . DVT of upper extremity (deep vein thrombosis) ([place])    Right basilic vein, June 6160  . ESRD (end stage renal disease) on dialysis ([place])    "TTF; [facility]; off [employer]." (09/07/2018)  . Essential hypertension, benign   . Fractures   . Glaucoma   . Lupus ([place])   . Pericarditis   . SLE (systemic lupus erythematosus) ([place])   . Type 2 diabetes mellitus ([place])   . UTI (lower urinary tract infection)   . Wound of right leg 06/2017    Assessment: 73 yo F presents with wound infection. On Eliquis PTA for DVT and Afib- last dose was 1/4 at 1000.   Eliquis held  due to suspicion for GIB.  Pharmacy asked to restart anticoagulation with IV heparin. WIll use aptt to dose heparin given recent apixaban - will affect HL. Heparin drip 550 uts/hr HL  0.7, aptt 95sec at goal - no bleeding noted.  Goal of Therapy:  Heparin level 0.3-0.7 units/ml aPTT 66-102 seconds Monitor platelets by anticoagulation protocol: Yes   Plan:  Continue  heparin gtt at 550 units/hr  Monitor daily aPTT / heparin level, CBC, s/s of bleed  Bonnita Nasuti Pharm.D. CPP, BCPS Clinical Pharmacist [phone] 10/04/2018 10:56 AM

## 2018-10-04 NOTE — Progress Notes (Signed)
PT Cancellation Note  Patient Details Name: [name] MRN: [id number] DOB: Aug 09, 1946   Cancelled Treatment:    Reason Eval/Treat Not Completed: Medical issues which prohibited therapy. Pt now intubated.    [place] 10/04/2018, 10:30 AM [place] Pager [phone] Office [phone]

## 2018-10-04 NOTE — Consult Note (Signed)
[place] Nurse wound consult note Reason for Consult: Medical device related pressure injury (MDRPI) to the left medial thigh and the left upper buttock due to the stump shrinking/compression device. Stage 3 pressure injury to sacrum, Stage 2 PrI to left ischial tuberosity Wound type: Pressure, surgical incision Pressure Injury POA: No Measurement: Sacral Stage 3 pressure injury: 7cm x 10cm x 0.2cm with peeling epidermis and deeper red hues noted throughout. Serous exudate in a small amount Left ischial tuberosity Stage 2 pressure injury:  3cm x 1cm x 0.1cm. Pink, moist wound bed, no exudate. Left medial thigh:  DTPI measuring 3cm x 2cm  Left upper buttock: DTPI measuring 2cm x 10cm Wound bed: As described above Drainage (amount, consistency, odor) As described above Periwound: intact, dry Dressing procedure/placement/frequency: I have provided Nursing with guidance for care of left stump incision, and the  MDRPIs related to the stump shrinker/compression device as well as the sacral and left IT pressure injuries via the orders.  Patient is on a mattress replacement with low air loss feature and is being turned and repositioned from side to side.  [place] nursing team will follow, and will remain available to this patient, the nursing and medical teams.  Please re-consult if needed. Thanks, Maudie Flakes, MSN, RN, [place], Arther Abbott  Pager# [phone]

## 2018-10-05 ENCOUNTER — Inpatient Hospital Stay (HOSPITAL_COMMUNITY): Payer: Medicare Other

## 2018-10-05 LAB — RENAL FUNCTION PANEL
Albumin: 1.4 g/dL — ABNORMAL LOW (ref 3.5–5.0)
Anion gap: 15 (ref 5–15)
BUN: 73 mg/dL — ABNORMAL HIGH (ref 8–23)
CO2: 21 mmol/L — AB (ref 22–32)
Calcium: 8.6 mg/dL — ABNORMAL LOW (ref 8.9–10.3)
Chloride: 100 mmol/L (ref 98–111)
Creatinine, Ser: 5.05 mg/dL — ABNORMAL HIGH (ref 0.44–1.00)
GFR calc Af Amer: 9 mL/min — ABNORMAL LOW (ref >60)
GFR calc non Af Amer: 8 mL/min — ABNORMAL LOW (ref >60)
Glucose, Bld: 220 mg/dL — ABNORMAL HIGH (ref 70–99)
Phosphorus: 5.7 mg/dL — ABNORMAL HIGH (ref 2.5–4.6)
Potassium: 4.3 mmol/L (ref 3.5–5.1)
Sodium: 136 mmol/L (ref 135–145)

## 2018-10-05 LAB — CBC
HCT: 29.1 % — ABNORMAL LOW (ref 36.0–46.0)
Hemoglobin: 8.7 g/dL — ABNORMAL LOW (ref 12.0–15.0)
MCH: 25.6 pg — ABNORMAL LOW (ref 26.0–34.0)
MCHC: 29.9 g/dL — ABNORMAL LOW (ref 30.0–36.0)
MCV: 85.6 fL (ref 80.0–100.0)
Platelets: 297 10*3/uL (ref 150–400)
RBC: 3.4 MIL/uL — ABNORMAL LOW (ref 3.87–5.11)
RDW: 18.9 % — AB (ref 11.5–15.5)
WBC: 40.2 10*3/uL — ABNORMAL HIGH (ref 4.0–10.5)
nRBC: 0.3 % — ABNORMAL HIGH (ref 0.0–0.2)

## 2018-10-05 LAB — MAGNESIUM
Magnesium: 2.1 mg/dL (ref 1.7–2.4)
Magnesium: 1.9 mg/dL (ref 1.7–2.4)

## 2018-10-05 LAB — GLUCOSE, CAPILLARY
GLUCOSE-CAPILLARY: 196 mg/dL — AB (ref 70–99)
Glucose-Capillary: 242 mg/dL — ABNORMAL HIGH (ref 70–99)
Glucose-Capillary: 247 mg/dL — ABNORMAL HIGH (ref 70–99)
Glucose-Capillary: 367 mg/dL — ABNORMAL HIGH (ref 70–99)
Glucose-Capillary: 152 mg/dL — ABNORMAL HIGH (ref 70–99)

## 2018-10-05 LAB — HEPARIN LEVEL (UNFRACTIONATED): Heparin Unfractionated: 0.53 IU/mL (ref 0.30–0.70)

## 2018-10-05 LAB — PHOSPHORUS: Phosphorus: 2 mg/dL — ABNORMAL LOW (ref 2.5–4.6)

## 2018-10-05 LAB — APTT: aPTT: 75 seconds — ABNORMAL HIGH (ref 24–36)

## 2018-10-05 MED ORDER — SODIUM CHLORIDE 0.9 % IV SOLN
500.0000 mg | INTRAVENOUS | Status: AC
  Administered 2018-10-05 – 2018-10-11 (×7): 500 mg via INTRAVENOUS
  Filled 2018-10-05 (×7): qty 0.5

## 2018-10-05 MED ORDER — HEPARIN SODIUM (PORCINE) 1000 UNIT/ML IJ SOLN
3.2000 mL | Freq: Once | INTRAMUSCULAR | Status: AC
  Administered 2018-10-05: 3200 [IU] via INTRAVENOUS

## 2018-10-05 NOTE — Plan of Care (Signed)
  Problem: Education: Goal: Knowledge of General Education information will improve Description Including pain rating scale, medication(s)/side effects and non-pharmacologic comfort measures Outcome: Not Progressing   Problem: Activity: Goal: Risk for activity intolerance will decrease Outcome: Not Progressing   Problem: Nutrition: Goal: Adequate nutrition will be maintained Outcome: Progressing   Problem: Coping: Goal: Level of anxiety will decrease Outcome: Progressing

## 2018-10-05 NOTE — Progress Notes (Signed)
  Progress Note    10/05/2018 8:16 AM 9 Days Post-Op  Subjective:  Unresponsive on vent  Tm 99.3  Vitals:   10/05/18 0727 10/05/18 0800  BP:  (!) 139/44  Pulse:  93  Resp:  (!) 21  Temp:  99.3 F (37.4 C)  SpO2: 100% 100%    Physical Exam: Incisions:  Clean and dry with staples in tact; no erythema    CBC    Component Value Date/Time   WBC 40.2 (H) 10/05/2018 0338   RBC 3.40 (L) 10/05/2018 0338   HGB 8.7 (L) 10/05/2018 0338   HGB 8.4 (L) 07/17/2017 1025   HCT 29.1 (L) 10/05/2018 0338   HCT 24.9 (L) 12/23/2017 1527   PLT 297 10/05/2018 0338   MCV 85.6 10/05/2018 0338   MCH 25.6 (L) 10/05/2018 0338   MCHC 29.9 (L) 10/05/2018 0338   RDW 18.9 (H) 10/05/2018 0338   LYMPHSABS 0.5 (L) 09/24/2018 1749   MONOABS 0.6 09/24/2018 1749   EOSABS 0.0 09/24/2018 1749   BASOSABS 0.0 09/24/2018 1749    BMET    Component Value Date/Time   NA 136 10/05/2018 0338   NA 140 08/09/2018 1159   K 4.3 10/05/2018 0338   CL 100 10/05/2018 0338   CO2 21 (L) 10/05/2018 0338   GLUCOSE 220 (H) 10/05/2018 0338   BUN 73 (H) 10/05/2018 0338   BUN 67 (H) 08/09/2018 1159   CREATININE 5.05 (H) 10/05/2018 0338   CALCIUM 8.6 (L) 10/05/2018 0338   CALCIUM 7.0 (L) 03/16/2014 0835   GFRNONAA 8 (L) 10/05/2018 0338   GFRAA 9 (L) 10/05/2018 0338    INR    Component Value Date/Time   INR 1.45 10/01/2018 1409     Intake/Output Summary (Last 24 hours) at 10/05/2018 0816 Last data filed at 10/05/2018 0800 Gross per 24 hour  Intake 1036.91 ml  Output 0 ml  Net 1036.91 ml     Assessment/Plan:  73 y.o. female is s/p left above knee amputation  9 Days Post-Op  -leukocytosis of 40k (increasing); continue IV abx per ID; stump does not appear to be source of infection. -stump looks good with staples in tact. There is no evidence of erythema/infection.  -unresponsive-CT head last evening with stable appearance of the head and no intracranial abnormality identified.    Leontine Locket,  PA-C [facility] [phone] 10/05/2018 8:16 AM

## 2018-10-05 NOTE — Plan of Care (Signed)
  Problem: Nutrition: Goal: Adequate nutrition will be maintained Outcome: Progressing   Problem: Coping: Goal: Level of anxiety will decrease Outcome: Progressing   Problem: Pain Managment: Goal: General experience of comfort will improve Outcome: Progressing   Problem: Safety: Goal: Ability to remain free from injury will improve Outcome: Progressing   Problem: Elimination: Goal: Will not experience complications related to urinary retention Outcome: Not Progressing Note:  Patient is anuric

## 2018-10-05 NOTE — Progress Notes (Signed)
[facility] Progress Note   Subjective:   Remains intubated  30% FiO2  Off pressors now   Objective Vitals:   10/05/18 1000 10/05/18 1100 10/05/18 1116 10/05/18 1200  BP: (!) 126/22 (!) 124/25  (!) 128/36  Pulse: 100 (!) 102  (!) 101  Resp: 18 (!) 29  20  Temp: 99.5 F (37.5 C) 99.5 F (37.5 C)  99.7 F (37.6 C)  TempSrc:    Esophageal  SpO2: 98% 99% 100% 100%  Weight:      Height:       Physical Exam  General: Intubated, chronically ill appearing elderly female, does not follow commands, doesn't blink to threat Heart: RRR 2/6 SEM  Lungs: clear bilat  Abdomen: soft NT/ND Extremities: bilat amp. NO stump edema Dialysis Access: R chest [facility]  Dialysis Orders:  [facility] TTS  3.5h  61kg   400/800  Hep 1800  R TDC Mircera 225 q2weeks (last 09/14/18)   Assessment:  1. Gangrene sp L AKA 12/29 2. ESRDon HD 3. VDRF 4. Anemia / ABLA - Aranesp 200 q Saturday 5. MBD ckd -No VDRA - On phoslo 667 TID. P stable 6. Volume - is below EDW here 7. Nutrition/PCM. Very low albumin. Poor PO intake  supplements/renal vit. 8. SLE - plaquenil/solucortef  9. DM per primarySSI 10. Access - LUE BVT occluded, per VVS needs upper left arm AVG when more stable 11. Thrush - current meds 12. Afib- amiodarone/resumed apixaban. 13. Abd pain - some chronicity   Plan: 1. HD today, min UF  Kelly Splinter MD [employer] pgr [phone]   10/05/2018, 12:29 PM      Additional Objective Labs: Basic Metabolic Panel: Recent Labs  Lab 10/03/18 0204 10/04/18 0159 10/04/18 1639 10/05/18 0338  NA 134* 135  --  136  K 3.6 4.4  --  4.3  CL 97* 99  --  100  CO2 21* 19*  --  21*  GLUCOSE 135* 170*  --  220*  BUN 22 49*  --  73*  CREATININE 2.56* 4.13*  --  5.05*  CALCIUM 7.9* 8.3*  --  8.6*  PHOS 4.2 4.4 5.3* 5.7*   CBC: Recent Labs  Lab 10/03/18 0050 10/03/18 0614 10/03/18 1118 10/04/18 0159 10/05/18 0338  WBC 26.0* 23.6* 23.9* 31.4* 40.2*   HGB 8.9* 8.3* 8.5* 8.8* 8.7*  HCT 30.2* 28.0* 28.3* 28.5* 29.1*  MCV 89.3 87.8 87.9 86.6 85.6  PLT 269 278 283 331 297   Blood Culture    Component Value Date/Time   SDES TRACHEAL ASPIRATE 10/01/2018 1746   SPECREQUEST NONE 10/01/2018 1746   CULT  10/01/2018 1746    ABUNDANT ESCHERICHIA COLI Confirmed Extended Spectrum Beta-Lactamase Producer (ESBL).  In bloodstream infections from ESBL organisms, carbapenems are preferred over piperacillin/tazobactam. They are shown to have a lower risk of mortality.    REPTSTATUS 10/03/2018 FINAL 10/01/2018 1746    Cardiac Enzymes: No results for input(s): CKTOTAL, CKMB, CKMBINDEX, TROPONINI in the last 168 hours. CBG: Recent Labs  Lab 10/04/18 2010 10/04/18 2301 10/05/18 0405 10/05/18 0815 10/05/18 1122  GLUCAP 87 145* 242* 247* 367*   Iron Studies: No results for input(s): IRON, TIBC, TRANSFERRIN, FERRITIN in the last 72 hours. Lab Results  Component Value Date   INR 1.45 10/01/2018   INR 1.17 09/26/2018   INR 1.18 06/08/2018   Medications: . sodium chloride 10 mL/hr at 10/04/18 2100  . sodium chloride    . sodium chloride    .  feeding supplement (VITAL AF 1.2 CAL) 40 mL/hr at 10/04/18 2100  . fentaNYL infusion INTRAVENOUS Stopped (10/03/18 0807)  . heparin 550 Units/hr (10/05/18 1200)  . meropenem (MERREM) IV 500 mg (10/04/18 1603)  . norepinephrine (LEVOPHED) Adult infusion     . amiodarone  200 mg Per Tube Daily  . brimonidine  1 drop Left Eye BID   And  . timolol  1 drop Left Eye BID  . calcium acetate  667 mg Oral TID WC  . chlorhexidine gluconate (MEDLINE KIT)  15 mL Mouth Rinse BID  . Chlorhexidine Gluconate Cloth  6 each Topical Q0600  . Chlorhexidine Gluconate Cloth  6 each Topical Q0600  . darbepoetin (ARANESP) injection - DIALYSIS  200 mcg Intravenous Q Sat-HD  . heparin  1,800 Units Dialysis Once in dialysis  . hydrocortisone sod succinate (SOLU-CORTEF) inj  50 mg Intravenous Q12H  . insulin aspart  0-9 Units  Subcutaneous Q4H  . ipratropium-albuterol  3 mL Nebulization BID  . latanoprost  1 drop Both Eyes QHS  . mouth rinse  15 mL Mouth Rinse 10 times per day  . pantoprazole (PROTONIX) IV  40 mg Intravenous Q12H  . sodium chloride flush  3 mL Intravenous Q12H

## 2018-10-05 NOTE — Progress Notes (Signed)
Patient's HR 170s-180s towards the end of HD treatment. Treatment stopped by HD RN at bedside. Dr. Lynetta Mare notified. EKG attempted but patient converted back to NSR/ST 90s-100s before 12 lead completed. Will continue to monitor patient closely.

## 2018-10-05 NOTE — Progress Notes (Addendum)
HD treatment terminated as ordered by nephrologist with 7mins remaining d/t sustained HR  n 170's. Pt in no apparent distress. Remains intubated/stabe.. Primary nurse at bedsdie.

## 2018-10-05 NOTE — Progress Notes (Signed)
SLP Cancellation Note  Patient Details Name: [name] MRN: [id number] DOB: 02/02/1946   Cancelled treatment:       Reason Eval/Treat Not Completed: Patient not medically ready. Remains intubated. Will sign off, please reorder when ready.    Mychele Seyller, Katherene Ponto 10/05/2018, 7:45 AM

## 2018-10-05 NOTE — Procedures (Signed)
ELECTROENCEPHALOGRAM REPORT   Patient: [name]       Room #: 0J62E EEG No. ID: [id number] Age: 73 y.o.        Sex: female Referring Physician: Sherry Ruffing Report Date:  10/05/2018        Interpreting Physician: Alexis Goodell  History: [name] is an 73 y.o. female with worsening mental status after surgery  Medications:  Pacerone, Phoslo, Solu-cortef, Insulin, Duoneb, Merrem, Protonix, Heparin, Levophed  Conditions of Recording:  This is a 21 channel routine scalp EEG performed with bipolar and monopolar montages arranged in accordance to the international 10/20 system of electrode placement. One channel was dedicated to EKG recording.  The patient is in the intubated and unsedated state.  Description:  The background activity is slow and poorly organized.  It consists of a low voltage polymorphic delta activity that is diffusely distributed.  There are frequent periodic epileptiform discharges noted over the left hemisphere that emanate from the left frontal region (phase reversal noted at [facility]).  This activity is often noted to spread to the the right hemisphere where periodic epileptiform discharges are noted as well but not as frequently.  This activity is persistent throughout the recording although there are periods when the epileptiform discharges appear more frequent than others..  No change in clinical activity is noted during these times.  Hyperventilation and intermittent photic stimulation were not performed.  IMPRESSION: This is an abnormal electroencephalogram secondary to left hemispheric PLEDS with spread and resultant less frequent right hemispheric PLEDS activity.     Alexis Goodell, MD Neurology [phone] 10/05/2018, 3:21 PM

## 2018-10-05 NOTE — Progress Notes (Signed)
EEG complete - results pending 

## 2018-10-05 NOTE — Progress Notes (Signed)
PT Cancellation Note  Patient Details Name: [name] MRN: [id number] DOB: 05-24-1946   Cancelled Treatment:    Reason Eval/Treat Not Completed: Medical issues which prohibited therapy. Noted HD terminated due to tachycardic events. Will follow-up for PT treatment tomorrow as pt medically appropriate.  Mabeline Caras, PT, DPT Acute Rehabilitation Services  Pager [phone] Office [place] 10/05/2018, 3:59 PM

## 2018-10-05 NOTE — Progress Notes (Signed)
Patient ID: [name], female   DOB: 1945-10-30, 73 y.o.   MRN: [id number]         [facility] Infectious Disease  Date of Admission:  09/24/2018           Day 2 meropenem ASSESSMENT: [name] is improving slowly on therapy for ESBL producing E. coli pneumonia.  She remains on the ventilator but is weaning.  She is scheduled for hemodialysis again this afternoon.  I will continue meropenem for 6 more days.  PLAN: 1. Continue meropenem through 10/11/2018 2. 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 amiodarone  200 mg Per Tube Daily  . brimonidine  1 drop Left Eye BID   And  . timolol  1 drop Left Eye BID  . calcium acetate  667 mg Oral TID WC  . chlorhexidine gluconate (MEDLINE KIT)  15 mL Mouth Rinse BID  . Chlorhexidine Gluconate Cloth  6 each Topical Q0600  . Chlorhexidine Gluconate Cloth  6 each Topical Q0600  . darbepoetin (ARANESP) injection - DIALYSIS  200 mcg Intravenous Q Sat-HD  . heparin  1,800 Units Dialysis Once in  dialysis  . hydrocortisone sod succinate (SOLU-CORTEF) inj  50 mg Intravenous Q12H  . insulin aspart  0-9 Units Subcutaneous Q4H  . ipratropium-albuterol  3 mL Nebulization BID  . latanoprost  1 drop Both Eyes QHS  . mouth rinse  15 mL Mouth Rinse 10 times per day  . pantoprazole (PROTONIX) IV  40 mg Intravenous Q12H  . sodium chloride flush  3 mL Intravenous Q12H   Continuous Infusions: . sodium chloride 10 mL/hr at 10/04/18 2100  . sodium chloride    . sodium chloride    . feeding supplement (VITAL AF 1.2 CAL) 40 mL/hr at 10/04/18 2100  . fentaNYL infusion INTRAVENOUS Stopped (10/03/18 0807)  . heparin 550 Units/hr (10/05/18 1147)  . meropenem (MERREM) IV 500 mg (10/04/18 1603)  . norepinephrine (LEVOPHED) Adult infusion     PRN Meds:.sodium chloride, sodium chloride, sodium chloride, acetaminophen (TYLENOL) oral liquid 160 mg/5 mL, albuterol, alteplase, docusate, fentaNYL, fentaNYL (SUBLIMAZE) injection, heparin, heparin, hydrALAZINE, lidocaine (PF), lidocaine-prilocaine, metoprolol tartrate, midazolam, midazolam, ondansetron, pentafluoroprop-tetrafluoroeth, sodium chloride flush  Review of Systems: Review of Systems  Unable to perform ROS: Intubated    Allergies  Allergen Reactions  . Ace Inhibitors Cough    OBJECTIVE: Vitals:   10/05/18 0800 10/05/18 0900 10/05/18 1000 10/05/18 1116  BP: (!) 139/44 (!) 135/34 (!) 126/22   Pulse: 93 95 100  Resp: (!) _0 Temp: 99.3 F (37.4 C) 99.3 F (37.4 C) 99.5 F (37.5 C)   TempSrc: Esophageal     SpO2: 100% 99% 98% 100%  Weight:      Height:       Body mass index is 21.27 kg/m.  Physical Exam Constitutional:      Comments: She is alert but does not follow commands.  Cardiovascular:     Rate and Rhythm: Normal rate and regular rhythm.     Heart sounds: No murmur.  Pulmonary:     Breath sounds: No wheezing or rhonchi.  Chest:    Musculoskeletal:     Comments: Staples remain in left AKA incision.  There is  no redness, drainage or other signs of inflammation.     Lab Results Lab Results  Component Value Date   WBC 40.2 (H) 10/05/2018   HGB 8.7 (L) 10/05/2018   HCT 29.1 (L) 10/05/2018   MCV 85.6 10/05/2018   PLT 297 10/05/2018    Lab Results  Component Value Date   CREATININE 5.05 (H) 10/05/2018   BUN 73 (H) 10/05/2018   NA 136 10/05/2018   K 4.3 10/05/2018   CL 100 10/05/2018   CO2 21 (L) 10/05/2018    Lab Results  Component Value Date   ALT 9 09/24/2018   AST 31 09/24/2018   ALKPHOS 72 09/24/2018   BILITOT 0.6 09/24/2018     Microbiology: Recent Results (from the past 240 hour(s))  Expectorated sputum assessment w rflx to resp cult     Status: None   Collection Time: 09/26/18  9:30 PM  Result Value Ref Range Status   Specimen Description SPUTUM  Final   Special Requests NONE  Final   Sputum evaluation   Final    Sputum specimen not acceptable for testing.  Please recollect.   RESULT CALLED TO, READ BACK BY AND VERIFIED WITH: RN Eilleen Kempf [phone] FCP Performed at [facility], [place] [address], [place], [place] [address]    Report Status 09/27/2018 FINAL  Final  Culture, Urine     Status: Abnormal   Collection Time: 09/29/18 11:53 PM  Result Value Ref Range Status   Specimen Description URINE, CATHETERIZED  Final   Special Requests   Final    NONE Performed at [facility], [place] [address], [place], [place] [address]    Culture MULTIPLE SPECIES PRESENT, SUGGEST RECOLLECTION (A)  Final   Report Status 10/01/2018 FINAL  Final  Culture, blood (Routine X 2) w Reflex to ID Panel     Status: None (Preliminary result)   Collection Time: 10/01/18  2:10 PM  Result Value Ref Range Status   Specimen Description BLOOD RIGHT HAND  Final   Special Requests   Final    BOTTLES DRAWN AEROBIC ONLY Blood Culture results may not be optimal due to an inadequate volume of blood received in culture bottles   Culture   Final    NO GROWTH 4 DAYS Performed at [facility], [place] [address] [place],  [address]    Report Status PENDING  Incomplete  Culture, blood (Routine X 2) w Reflex to ID Panel     Status: None (Preliminary result)   Collection Time: 10/01/18  2:18 PM  Result Value Ref Range Status   Specimen Description BLOOD RIGHT HAND  Final   Special Requests   Final    BOTTLES DRAWN AEROBIC ONLY Blood Culture adequate  volume   Culture   Final    NO GROWTH 4 DAYS Performed at [facility], [place] [address], [place], [place] [address]    Report Status PENDING  Incomplete  Culture, respiratory (non-expectorated)     Status: None   Collection Time: 10/01/18  5:46 PM  Result Value Ref Range Status   Specimen Description TRACHEAL ASPIRATE  Final   Special Requests NONE  Final   Gram Stain   Final    MODERATE WBC PRESENT, PREDOMINANTLY PMN ABUNDANT GRAM NEGATIVE RODS RARE GRAM POSITIVE RODS Performed at [facility], [place] [address] [place], [place] [address]    Culture   Final    ABUNDANT ESCHERICHIA COLI Confirmed Extended Spectrum Beta-Lactamase Producer (ESBL).  In bloodstream infections from ESBL organisms, carbapenems are preferred over piperacillin/tazobactam. They are shown to have a lower risk of mortality.    Report Status 10/03/2018 FINAL  Final   Organism ID, Bacteria ESCHERICHIA COLI  Final      Susceptibility   Escherichia coli - MIC*    AMPICILLIN >=32 RESISTANT Resistant     CEFAZOLIN >=64 RESISTANT Resistant     CEFEPIME RESISTANT Resistant     CEFTAZIDIME RESISTANT Resistant     CEFTRIAXONE >=64 RESISTANT Resistant     CIPROFLOXACIN >=4 RESISTANT Resistant     GENTAMICIN <=1 SENSITIVE Sensitive     IMIPENEM <=0.25 SENSITIVE Sensitive     TRIMETH/SULFA >=320 RESISTANT Resistant     AMPICILLIN/SULBACTAM >=32 RESISTANT Resistant     PIP/TAZO <=4 SENSITIVE Sensitive     Extended ESBL POSITIVE Resistant     * ABUNDANT ESCHERICHIA COLI    Michel Bickers, MD [facility] for Infectious  [place] [phone] pager   [phone] cell 10/05/2018, 12:00 PM

## 2018-10-05 NOTE — Progress Notes (Signed)
[place] for hepain Indication: atrial fibrillation / DVT  Allergies  Allergen Reactions  . Ace Inhibitors Cough    Patient Measurements: TBW 60.8 kg Heparin Dosing Weight: 58 kg  Vital Signs: Temp: 99.3 F (37.4 C) (01/07 0800) Temp Source: Esophageal (01/07 0800) BP: 139/44 (01/07 0800) Pulse Rate: 93 (01/07 0800)  Labs: Recent Labs    10/03/18 0204  10/03/18 1118  10/03/18 1355 10/03/18 2129 10/04/18 0159 10/04/18 0923 10/05/18 0338  HGB  --    < > 8.5*  --   --   --  8.8*  --  8.7*  HCT  --    < > 28.3*  --   --   --  28.5*  --  29.1*  PLT  --    < > 283  --   --   --  331  --  297  APTT  --   --   --    < > 50* 83*  --  95* 75*  HEPARINUNFRC  --   --   --   --  0.62  --   --  0.71* 0.53  CREATININE 2.56*  --   --   --   --   --  4.13*  --  5.05*   < > = values in this interval not displayed.    Estimated Creatinine Clearance: 7.2 mL/min (A) (by C-G formula based on SCr of 5.05 mg/dL (H)).   Medical History: Past Medical History:  Diagnosis Date  . Anemia   . Arthritis   . Asthma   . Atrial fibrillation ([place])   . Atrial flutter ([place])    a. s/p TEE/DCCV December 2013 b. 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 Bone spur of toe    Right 5th toe  . Chronic diastolic CHF (congestive heart failure) (HCC)    a. EF 50-55% by echo in 2015  . Colon polyposis   . COPD (chronic obstructive pulmonary disease) ([place])   . DVT of upper extremity (deep vein thrombosis) ([place])    Right basilic vein, June 2992  . ESRD (end stage renal disease) on dialysis ([place])    "TTF; [place]; off [employer]." (09/07/2018)  . Essential hypertension, benign   . Fractures   . Glaucoma   . Lupus ([place])   . Pericarditis   . SLE (systemic lupus erythematosus) ([place])   . Type 2  diabetes mellitus ([place])   . UTI (lower urinary tract infection)   . Wound of right leg 06/2017    Assessment: 73 yo F presents with wound infection. On Eliquis PTA for DVT and Afib- last dose was 1/4 at 1000.   Eliquis held  due to suspicion for GIB.  Pharmacy asked to restart anticoagulation with IV heparin. aptt and HL starting to correlate will use HL on;y now for heparin dosing.   Heparin drip 550 uts/hr HL 0.5, aptt 70sec at goal - no bleeding noted.  Goal of Therapy:  Heparin level 0.3-0.7 units/ml aPTT 66-102 seconds Monitor platelets by anticoagulation protocol: Yes   Plan:  Continue  heparin gtt at 550 units/hr  Monitor daily aPTT / heparin level, CBC, s/s of bleed  Bonnita Nasuti Pharm.D. CPP, BCPS Clinical Pharmacist [phone] 10/05/2018 8:52 AM

## 2018-10-05 NOTE — Progress Notes (Addendum)
NAME:  [name], MRN:  [id number], DOB:  [birth year], LOS: 15 ADMISSION DATE:  09/24/2018, CONSULTATION DATE: 10/01/2018 REFERRING MD: [name], CHIEF COMPLAINT: Acute respiratory failure  Brief History   73 year old woman with end-stage renal disease, peripheral vascular disease, diabetes, hypertension with diastolic dysfunction.  Status post left AKA 09/26/2018 for LLE ischemia and metatarsal wound.  Poor p.o. intake, progressive weakness, A. fib since the procedure.  More more lethargy and difficulty with airway protection, now with pooling secretions and increased work of breathing. PCCM consulted for evolving resp failure.   History of present illness   73 year old chronically ill woman with end-stage renal disease on hemodialysis, peripheral vascular disease, COPD, diabetes, hypertension with associated diastolic dysfunction and CHF, SLE on chronic prednisone and Plaquenil, atrial fibrillation on amiodarone and Eliquis.  She has a history of a right AKA in 05/2018, more recently a left transmetatarsal amputation 09/06/2018.  She was admitted on 12/27 with gangrene of that foot.  She was treated with vancomycin and ceftriaxone, underwent left AKA on 12/29.  Subsequent course complicated by poor p.o. intake in the setting malnutrition, oral thrush and right flank pain.  Significant difficulty with secretion clearance, evolving poor cough and increased work of breathing.  Was felt that she was experiencing an overall decline and palliative care medicine was consulted.  On 1/3 she was progressively weak, unable to protect airway adequately or clear secretions at all.  Developing some degree of encephalopathy and showing evidence for respiratory failure. Patient's stated wishes are to undergo all potential life-saving therapies, confirmed to me by her husband at bedside today.  We performed NT suctioning and obtained copious thick green secretions were posterior pharynx. Plan intubation for  airway protection and MV.   Past Medical History   has a past medical history of Anemia, Arthritis, Asthma, Atrial fibrillation ([place]), Atrial flutter ([place]), Bone spur of toe, Chronic diastolic CHF (congestive heart failure) ([place]), Colon polyposis, COPD (chronic obstructive pulmonary disease) ([place]), DVT of upper extremity (deep vein thrombosis) ([place]), ESRD (end stage renal disease) on dialysis [facility]), Essential hypertension, benign, Fractures, Glaucoma, Lupus ([place]), Pericarditis, SLE (systemic lupus erythematosus) ([place]), Type 2 diabetes mellitus ([place]), UTI (lower urinary tract infection), and Wound of right leg (06/2017).   Significant Hospital Events   12/27 admission with left lower extremity gangrenous wound 12/29 left AKA 1/3 transfer to ICU and intubated 1/4 OGT with bright red blood  Consults:  Vascular surgery Nephrology Palliative care medicine Infectious disease  Procedures:  LUE fistula (pre-admit) R [place] tunneled HD catheter (pre-admit) Left AKA 12/29  Significant Diagnostic Tests:  Head CT 12/27 >> stable small left parafalcine meningioma, chronic small vessel disease, nothing acute Right hip/pelvis x-ray 12/28 >> no pelvic or hip fracture, mild to moderate DJD of both hips EEG 1/2 >> global slowing without any epileptiform activity  Micro Data:  Blood cultures 12/27 >> negative Blood cultures 1/3 >>  Urine 1/1 >> multiple species, nondiagnostic Resp Cx 1/3>> ESBL E.coli  Antimicrobials:  Cefepime 12/27 >> 1/1; 1/3 >>  1/5 Vancomycin 12/27 >> 1/1; 1/3 >> 1/5 Fluconazole 1/2 >>   Flagyl 1/3 >>  1/5 Meropenem 1/5 >>  Interim history/subjective:  No acute events overnight patient in remain the same telemetry.  No family at bedside.  Plan is for hemodialysis today.    Objective   Blood pressure (!) 129/43, pulse 91, temperature 98.4 F (36.9 C), resp. rate 19, height 5' (1.524 m), weight 49.4 kg, SpO2 99 %.  Vent Mode: PRVC FiO2 (%):  [30 %-40 %] 40 % Set Rate:   [16 bmp] 16 bmp Vt Set:  [360 mL] 360 mL PEEP:  [5 cmH20] 5 cmH20 Pressure Support:  [8 cmH20] 8 cmH20 Plateau Pressure:  [16 cmH20-21 cmH20] 16 cmH20   Intake/Output Summary (Last 24 hours) at 10/05/2018 8850 Last data filed at 10/05/2018 0400 Gross per 24 hour  Intake 875.91 ml  Output 0 ml  Net 875.91 ml   Filed Weights   10/02/18 1714 10/03/18 0200 10/05/18 0447  Weight: 50.7 kg 50.2 kg 49.4 kg    Physical Exam: General: Ill-appearing female, awake but not interactive, no acute distress, intubated HENT: [place], AT, ETT in place, OGT with green output Eyes: EOMI, no scleral icterus Respiratory: Bilaterally  Cardiovascular: RRR, -M/R/G, no JVD GI: Normoactive bowel sounds, soft, nontender Extremities: old right AKA, new left AKA with clean, dry dressing place Neuro: Awake but not interactive, not following commands  CXR 10/02/18 independently reviewed by me - mild interstitial pulm edema, LLL atelectasis  Resolved Hospital Problem list     Assessment & Plan:   Acute multifactorial encephalopathy, chiefly metabolic2/2 sepsis Right forearm twitching noted earlier in hospitalization, EEG reassuring Depression ? Seizures  Awake but not following commands. Discussed with pharmacy, they reported that carbamazepine was started within the past 2 weeks by the SNF for neuropathy. Given this new onset of encephalopathy will discontinue carbamazepine for now. CT head 12/27 showed stable small left parafalcine meningioma and chronic small vessel ischemic disease, no acute findings.   -Continuing to hold home carbamazepine. Hold Zoloft. This medication was started as an inpatient -Repeat CT head showed stable findings, no acute findings, atrophy with chronic small vessel ischemia -STAT EEG -CMP in the morning -Clinically monitor  Septic shock secondary to ESBL E. coli pneumonia   Acute respiratory failure COPD Sputum culture grew out ESBL.  Remained afebrile overnight. WBC elevated to 40  today. Will continue treatment for E Coli pneumonia, trend WBCs and fever curve.  She is still having elevation of white blood cell count minimally responsive.  Will reevaluate after hemodialysis to assess mental status.  -If patient's leukocytosis continues to increase will likely obtain CT chest/abdomen/pelvis tomorrow -Blood cultures NGTD x3 days -Continue mechanical ventilation -Continue meropenem x8 days -ID following -Repeat blood cultures today -Continue Solu-cortef BID -Bronchodilators  -SBT daily -Discontinue Levophed drip PRN  UGIB in setting of anticoagulation likely secondary to NG tube trauma Chronic right-sided abdominal pain No BRB out of OG tube today, Hgb has been stable.  -Continue heparin drip for anticoagulation for A. Fib, need to transition back to Eliquis -Trend CBC  -Continue IV PPI BID   Chronic anemia Likely related to ESRD.  Hemoglobin has been stable -Weekly aranesp -Trend CBC as above  End-stage renal disease, HD TTS Hyperkalemia, mild -HD per nephrology recommendations. Last dialysis was 1/4.   -Dialysis today  Hypoalbuminemia: Albumin today was 1.5.  -Continue tube feeds -Add pro-stat  Recent LLE AKA for ischemia/gangrene  Prior RLE AKA Peripheral vascular disease S/p LLE AKA on 12/29 -Continue wound consult  Oral candidiasis -Fluconazole T-Th-S via HD  Hypertension Chronic diastolic CHF Paroxysmal atrial fibrillation -Continue Amiodarone -Continue heparin drip while inpatient -Hold apixaban   Diabetes --Sliding scale insulin per protocol  SLE Outpatient regimen prednisone and Plaquenil --Hold Plaquenil given concern for possible sepsis --Hold PO prednisone. Decreased stress dose steroids to BID  3 cm right upper pole renal mass of unclear significance Not clear that any  biopsy has been performed.  Question whether this may be contributing to current failure to thrive, presentation.  Consider repeat CT abdomen when stable to do  so if we believe this will help with prognostication and overall goals for care.   Best practice:  Diet: Tube feeds Pain/Anxiety/Delirium protocol (if indicated): intermittent versed, fentanyl  VAP protocol (if indicated): yes DVT prophylaxis: Heparin GI prophylaxis: PPI Glucose control: SSI Mobility: bedrest Code Status: Full  Family Communication: Discussed with husband Disposition: Remain in ICU    Labs   CBC: Recent Labs  Lab 10/03/18 0050 10/03/18 0614 10/03/18 1118 10/04/18 0159 10/05/18 0338  WBC 26.0* 23.6* 23.9* 31.4* 40.2*  HGB 8.9* 8.3* 8.5* 8.8* 8.7*  HCT 30.2* 28.0* 28.3* 28.5* 29.1*  MCV 89.3 87.8 87.9 86.6 85.6  PLT 269 278 283 331 161    Basic Metabolic Panel: Recent Labs  Lab 10/01/18 0244 10/02/18 0333 10/03/18 0204 10/04/18 0159 10/04/18 1639 10/05/18 0338  NA 140 140 134* 135  --  136  K 4.9 5.4* 3.6 4.4  --  4.3  CL 100 104 97* 99  --  100  CO2 26 24 21* 19*  --  21*  GLUCOSE 169* 141* 135* 170*  --  220*  BUN 22 43* 22 49*  --  73*  CREATININE 3.07* 4.45* 2.56* 4.13*  --  5.05*  CALCIUM 8.3* 8.4* 7.9* 8.3*  --  8.6*  MG  --   --   --   --  1.9 2.1  PHOS 3.4 4.2 4.2 4.4 5.3* 5.7*   GFR: Estimated Creatinine Clearance: 7.2 mL/min (A) (by C-G formula based on SCr of 5.05 mg/dL (H)). Recent Labs  Lab 10/01/18 1409  10/03/18 0614 10/03/18 1118 10/03/18 1247 10/03/18 1527 10/04/18 0159 10/05/18 0338  WBC  --    < > 23.6* 23.9*  --   --  31.4* 40.2*  LATICACIDVEN 1.7  --   --   --  1.2 1.3  --   --    < > = values in this interval not displayed.    Liver Function Tests: Recent Labs  Lab 10/01/18 0244 10/02/18 0333 10/03/18 0204 10/04/18 0159 10/05/18 0338  ALBUMIN 1.6* 1.5* 1.5* 1.5* 1.4*   Recent Labs  Lab 10/02/18 0333  LIPASE 19   No results for input(s): AMMONIA in the last 168 hours.  ABG    Component Value Date/Time   PHART 7.367 10/01/2018 1838   PCO2ART 48.0 10/01/2018 1838   PO2ART 158.0 (H) 10/01/2018  1838   HCO3 27.5 10/01/2018 1838   TCO2 29 10/01/2018 1838   ACIDBASEDEF 5.4 (H) 11/03/2017 1025   O2SAT 99.0 10/01/2018 1838     Coagulation Profile: Recent Labs  Lab 10/01/18 1409  INR 1.45    CBG: Recent Labs  Lab 10/04/18 1109 10/04/18 1551 10/04/18 2010 10/04/18 2301 10/05/18 0405  GLUCAP 176* 121* 87 Berkley, M.D. PGY1 Pager [phone] 10/05/2018 6:52 AM

## 2018-10-06 LAB — C DIFFICILE QUICK SCREEN W PCR REFLEX
C Diff antigen: POSITIVE — AB
C Diff toxin: NEGATIVE

## 2018-10-06 LAB — COMPREHENSIVE METABOLIC PANEL
ALT: 175 U/L — ABNORMAL HIGH (ref 0–44)
AST: 174 U/L — ABNORMAL HIGH (ref 15–41)
Albumin: 1.3 g/dL — ABNORMAL LOW (ref 3.5–5.0)
Alkaline Phosphatase: 107 U/L (ref 38–126)
Anion gap: 11 (ref 5–15)
BUN: 42 mg/dL — ABNORMAL HIGH (ref 8–23)
CO2: 24 mmol/L (ref 22–32)
Calcium: 8 mg/dL — ABNORMAL LOW (ref 8.9–10.3)
Chloride: 100 mmol/L (ref 98–111)
Creatinine, Ser: 2.95 mg/dL — ABNORMAL HIGH (ref 0.44–1.00)
GFR calc Af Amer: 18 mL/min — ABNORMAL LOW (ref >60)
GFR calc non Af Amer: 15 mL/min — ABNORMAL LOW (ref >60)
GLUCOSE: 391 mg/dL — AB (ref 70–99)
Potassium: 3.1 mmol/L — ABNORMAL LOW (ref 3.5–5.1)
Sodium: 135 mmol/L (ref 135–145)
TOTAL PROTEIN: 6.1 g/dL — AB (ref 6.5–8.1)
Total Bilirubin: 0.6 mg/dL (ref 0.3–1.2)

## 2018-10-06 LAB — CBC
HCT: 26.5 % — ABNORMAL LOW (ref 36.0–46.0)
Hemoglobin: 8.2 g/dL — ABNORMAL LOW (ref 12.0–15.0)
MCH: 26.9 pg (ref 26.0–34.0)
MCHC: 30.9 g/dL (ref 30.0–36.0)
MCV: 86.9 fL (ref 80.0–100.0)
Platelets: 316 10*3/uL (ref 150–400)
RBC: 3.05 MIL/uL — ABNORMAL LOW (ref 3.87–5.11)
RDW: 19.6 % — ABNORMAL HIGH (ref 11.5–15.5)
WBC: 31.6 10*3/uL — ABNORMAL HIGH (ref 4.0–10.5)
nRBC: 0.4 % — ABNORMAL HIGH (ref 0.0–0.2)

## 2018-10-06 LAB — CLOSTRIDIUM DIFFICILE BY PCR, REFLEXED: CDIFFPCR: POSITIVE — AB

## 2018-10-06 LAB — BASIC METABOLIC PANEL
Anion gap: 12 (ref 5–15)
BUN: 70 mg/dL — ABNORMAL HIGH (ref 8–23)
CO2: 22 mmol/L (ref 22–32)
Calcium: 8.3 mg/dL — ABNORMAL LOW (ref 8.9–10.3)
Chloride: 101 mmol/L (ref 98–111)
Creatinine, Ser: 3.63 mg/dL — ABNORMAL HIGH (ref 0.44–1.00)
GFR calc Af Amer: 14 mL/min — ABNORMAL LOW (ref >60)
GFR calc non Af Amer: 12 mL/min — ABNORMAL LOW (ref >60)
GLUCOSE: 382 mg/dL — AB (ref 70–99)
Potassium: 3.6 mmol/L (ref 3.5–5.1)
Sodium: 135 mmol/L (ref 135–145)

## 2018-10-06 LAB — GLUCOSE, CAPILLARY
GLUCOSE-CAPILLARY: 296 mg/dL — AB (ref 70–99)
GLUCOSE-CAPILLARY: 350 mg/dL — AB (ref 70–99)
Glucose-Capillary: 360 mg/dL — ABNORMAL HIGH (ref 70–99)
Glucose-Capillary: 289 mg/dL — ABNORMAL HIGH (ref 70–99)
Glucose-Capillary: 313 mg/dL — ABNORMAL HIGH (ref 70–99)
Glucose-Capillary: 375 mg/dL — ABNORMAL HIGH (ref 70–99)

## 2018-10-06 LAB — CULTURE, BLOOD (ROUTINE X 2)
Culture: NO GROWTH
Culture: NO GROWTH
Special Requests: ADEQUATE

## 2018-10-06 LAB — HEPARIN LEVEL (UNFRACTIONATED): Heparin Unfractionated: 0.34 IU/mL (ref 0.30–0.70)

## 2018-10-06 LAB — PHOSPHORUS
Phosphorus: 2.1 mg/dL — ABNORMAL LOW (ref 2.5–4.6)
Phosphorus: 2.5 mg/dL (ref 2.5–4.6)

## 2018-10-06 LAB — APTT: aPTT: 51 seconds — ABNORMAL HIGH (ref 24–36)

## 2018-10-06 LAB — MAGNESIUM: Magnesium: 1.8 mg/dL (ref 1.7–2.4)

## 2018-10-06 MED ORDER — POTASSIUM CHLORIDE 10 MEQ/100ML IV SOLN: 10.0000 meq | INTRAVENOUS | Status: DC

## 2018-10-06 MED ORDER — POTASSIUM CHLORIDE 20 MEQ/15ML (10%) PO SOLN
30.0000 meq | ORAL | Status: DC
  Administered 2018-10-06: 30 meq
  Filled 2018-10-06: qty 30

## 2018-10-06 MED ORDER — CHLORHEXIDINE GLUCONATE CLOTH 2 % EX PADS
6.0000 | MEDICATED_PAD | Freq: Every day | CUTANEOUS | Status: DC
  Administered 2018-10-08 – 2018-10-11 (×3): 6 via TOPICAL

## 2018-10-06 MED ORDER — PRO-STAT SUGAR FREE PO LIQD
30.0000 mL | Freq: Two times a day (BID) | ORAL | Status: DC
  Administered 2018-10-06 – 2018-10-08 (×5): 30 mL via ORAL
  Filled 2018-10-06 (×5): qty 30

## 2018-10-06 MED ORDER — INSULIN GLARGINE 100 UNIT/ML ~~LOC~~ SOLN
5.0000 [IU] | Freq: Every day | SUBCUTANEOUS | Status: DC
  Administered 2018-10-06 – 2018-10-11 (×6): 5 [IU] via SUBCUTANEOUS
  Filled 2018-10-06 (×7): qty 0.05

## 2018-10-06 MED ORDER — SODIUM PHOSPHATES 45 MMOLE/15ML IV SOLN
10.0000 mmol | Freq: Once | INTRAVENOUS | Status: AC
  Administered 2018-10-06: 10 mmol via INTRAVENOUS
  Filled 2018-10-06: qty 3.33

## 2018-10-06 NOTE — Progress Notes (Signed)
[facility] Progress Note   Subjective:   Remains intubated   Objective Vitals:   10/06/18 1102 10/06/18 1122 10/06/18 1135 10/06/18 1200  BP: 119/82 119/82  (!) 129/17  Pulse: (!) 106 (!) 104  99  Resp: (!) 25 (!) 32  (!) 23  Temp: (!) 100.4 F (38 C)  (!) 100.8 F (38.2 C) (!) 100.4 F (38 C)  TempSrc:      SpO2: 100% 100%  100%  Weight:      Height:       Physical Exam  General: on vent, opens eyes to voice and makes eye contact today which is new, not following commands Heart: RRR 2/6 SEM  Lungs: clear bilat  Abdomen: soft NT/ND Extremities: bilat amp. NO stump edema Dialysis Access: R chest [facility]  Dialysis Orders:  [facility] TTS  3.5h  61kg   400/800  Hep 1800  R TDC Mircera 225 q2weeks (last 09/14/18)   Assessment:  1. Gangrene sp L AKA 12/29 2. AMS may be improving some 3. ESRDon HD 4. VDRF 5. Anemia / ABLA - Aranesp 200 q Saturday 6. MBD ckd -No VDRA - On phoslo 667 TID. P stable 7. Volume - is sig below EDW here 8. Nutrition/PCM. Very low albumin. Poor PO intake  supplements/renal vit. 9. SLE - plaquenil/solucortef  10. DM per primarySSI 11. Access - LUE BVT occluded, per VVS needs upper left arm AVG when more stable 12. Thrush - current meds 13. Afib- amiodarone/resumed apixaban. 14. Abd pain - some chronicity   Plan: 1. HD tomorrow 2. Cont supportive care  Kelly Splinter MD [facility] pgr [phone]   10/06/2018, 12:49 PM      Additional Objective Labs: Basic Metabolic Panel: Recent Labs  Lab 10/04/18 0159  10/05/18 0338 10/05/18 1621 10/06/18 0350  NA 135  --  136  --  135  K 4.4  --  4.3  --  3.1*  CL 99  --  100  --  100  CO2 19*  --  21*  --  24  GLUCOSE 170*  --  220*  --  391*  BUN 49*  --  73*  --  42*  CREATININE 4.13*  --  5.05*  --  2.95*  CALCIUM 8.3*  --  8.6*  --  8.0*  PHOS 4.4   < > 5.7* 2.0* 2.1*   < > = values in this interval not displayed.   CBC: Recent Labs  Lab  10/03/18 0614 10/03/18 1118 10/04/18 0159 10/05/18 0338 10/06/18 0350  WBC 23.6* 23.9* 31.4* 40.2* 31.6*  HGB 8.3* 8.5* 8.8* 8.7* 8.2*  HCT 28.0* 28.3* 28.5* 29.1* 26.5*  MCV 87.8 87.9 86.6 85.6 86.9  PLT 278 283 331 297 316   Blood Culture    Component Value Date/Time   SDES BLOOD RIGHT ARM 10/05/2018 1621   SPECREQUEST  10/05/2018 1621    BOTTLES DRAWN AEROBIC ONLY Blood Culture adequate volume   CULT  10/05/2018 1621    NO GROWTH < 24 HOURS Performed at [facility], [place] [address], [place], [place] [address]    REPTSTATUS PENDING 10/05/2018 1621    Cardiac Enzymes: No results for input(s): CKTOTAL, CKMB, CKMBINDEX, TROPONINI in the last 168 hours. CBG: Recent Labs  Lab 10/05/18 1926 10/06/18 0037 10/06/18 0348 10/06/18 0801 10/06/18 1137  GLUCAP 196* 296* 360* 289* 313*   Iron Studies: No results for input(s): IRON, TIBC, TRANSFERRIN, FERRITIN in the last 72 hours.  Lab Results  Component Value Date   INR 1.45 10/01/2018   INR 1.17 09/26/2018   INR 1.18 06/08/2018   Medications: . sodium chloride Stopped (10/06/18 1008)  . sodium chloride    . sodium chloride    . feeding supplement (VITAL AF 1.2 CAL) 40 mL/hr at 10/05/18 2000  . fentaNYL infusion INTRAVENOUS Stopped (10/03/18 0807)  . heparin 550 Units/hr (10/06/18 1200)  . meropenem (MERREM) IV Stopped (10/05/18 1818)  . norepinephrine (LEVOPHED) Adult infusion     . amiodarone  200 mg Per Tube Daily  . brimonidine  1 drop Left Eye BID   And  . timolol  1 drop Left Eye BID  . calcium acetate  667 mg Oral TID WC  . chlorhexidine gluconate (MEDLINE KIT)  15 mL Mouth Rinse BID  . Chlorhexidine Gluconate Cloth  6 each Topical Q0600  . Chlorhexidine Gluconate Cloth  6 each Topical Q0600  . darbepoetin (ARANESP) injection - DIALYSIS  200 mcg Intravenous Q Sat-HD  . feeding supplement (PRO-STAT SUGAR FREE 64)  30 mL Oral BID  . heparin  1,800 Units Dialysis Once in dialysis  . hydrocortisone sod  succinate (SOLU-CORTEF) inj  50 mg Intravenous Q12H  . insulin aspart  0-9 Units Subcutaneous Q4H  . insulin glargine  5 Units Subcutaneous Daily  . ipratropium-albuterol  3 mL Nebulization BID  . latanoprost  1 drop Both Eyes QHS  . mouth rinse  15 mL Mouth Rinse 10 times per day  . pantoprazole (PROTONIX) IV  40 mg Intravenous Q12H  . sodium chloride flush  3 mL Intravenous Q12H

## 2018-10-06 NOTE — Evaluation (Addendum)
Occupational Therapy Re-Evaluation Patient Details Name: [name] MRN: [id number] DOB: [birth year] Today's Date: 10/06/2018    History of Present Illness [name] is a 73 y.o. female with medical history significant of hypertension, hyperlipidemia, diabetes mellitus, COPD, GERD, gout, and anemia, atrial fibrillation and DVT on Eliquis, ESRD-HD (TTS), lupus, PAD, R AKA (05/2018), now s/p L AKA. Pt with AMS and lethargy 10/01/18 transferred to ICU and intubated.    Clinical Impression   Pt re-evaluated due to significant change in medical and functional status. Pt currently intubated, but alert. Did not follow commands, but nodded or used facial expression to communicate. Pt is profoundly weak and dependent in all ADL. She requires 2 person, total assist for bed level mobility and demonstrates zero sitting balance. Pt with stable vital signs throughout session. Updated OT goals. Continue to recommend SNF. Will continue to follow.    Follow Up Recommendations  SNF;Supervision/Assistance - 24 hour    Equipment Recommendations       Recommendations for Other Services       Precautions / Restrictions Precautions Precautions: Fall Precaution Comments: intubated, flexiseal Restrictions Weight Bearing Restrictions: No LUE Weight Bearing: Partial weight bearing Other Position/Activity Restrictions: pt s/p B AKA      Mobility Bed Mobility Overal bed mobility: Needs Assistance Bed Mobility: Rolling;Supine to Sit;Sit to Supine Rolling: +2 for physical assistance;Total assist   Supine to sit: +2 for physical assistance;Total assist Sit to supine: +2 for physical assistance;Total assist   General bed mobility comments: pt without attempt to assist  Transfers                 General transfer comment: deferred    Balance Overall balance assessment: Needs assistance Sitting-balance support: Bilateral upper extremity supported Sitting balance-Leahy Scale:  Zero Sitting balance - Comments: posterior lean                                   ADL either performed or assessed with clinical judgement   ADL                                         General ADL Comments: requires total assist     Vision   Additional Comments: unable to assess accurately due to ETT     Perception     Praxis      Pertinent Vitals/Pain Pain Assessment: Faces Faces Pain Scale: Hurts little more Pain Location: buttocks Pain Descriptors / Indicators: Grimacing Pain Intervention(s): Monitored during session;Repositioned     Hand Dominance Right   Extremity/Trunk Assessment Upper Extremity Assessment Upper Extremity Assessment: RUE deficits/detail;LUE deficits/detail RUE Deficits / Details: performed PROM RUE Coordination: decreased fine motor;decreased gross motor LUE Deficits / Details: Performed PROM LUE Coordination: decreased fine motor;decreased gross motor   Lower Extremity Assessment RLE Deficits / Details: R AKA LLE Deficits / Details: L AKA (new)   Cervical / Trunk Assessment Cervical / Trunk Assessment: Kyphotic(with forward head)   Communication Communication Communication: Other (comment)(intubated)   Cognition Arousal/Alertness: Awake/alert Behavior During Therapy: Flat affect Overall Cognitive Status: Difficult to assess                                 General Comments: pt nodding to yes/no questions and shrugging  shoulders, not truly following commands   General Comments       Exercises     Shoulder Instructions      Home Living Family/patient expects to be discharged to:: Skilled nursing facility                                 Additional Comments: Pt was at [facility] for rehab PTA, before that she was at home and her niece came over daily to bathe and dress her      Prior Functioning/Environment Level of Independence: Needs assistance  Gait / Transfers  Assistance Needed: non ambulatory, assisted transfers to wheelchair, [facility] using sliding board with at least 1 person assist, sometimes 2  ADL's / Homemaking Assistance Needed: assisted by family, patient use depends for urination, assisted to [facility] for bowel movements using sliding board. Niece comes to assist with sponge bathing.            OT Problem List: Decreased strength;Decreased activity tolerance;Impaired balance (sitting and/or standing);Pain;Impaired UE functional use      OT Treatment/Interventions: Self-care/ADL training;Therapeutic exercise;Energy conservation;DME and/or AE instruction;Therapeutic activities;Patient/family education;Balance training    OT Goals(Current goals can be found in the care plan section) Acute Rehab OT Goals Patient Stated Goal: pt unable to state, but agreeable to therapy  OT Goal Formulation: Patient unable to participate in goal setting Time For Goal Achievement: 10/20/18 Potential to Achieve Goals: Fair ADL Goals Pt Will Perform Grooming: with max assist;bed level Pt Will Transfer to Toilet: anterior/posterior transfer;with +2 assist;with max assist Pt/caregiver will Perform Home Exercise Program: Both right and left upper extremity;Increased strength;With minimal assist(A/AROM) Additional ADL Goal #1: Pt will roll with moderate assistance for positioning and pericare. Additional ADL Goal #2: Pt will demonstrate fair sitting balance with B UE support x 5 minutes in preparation for ADL. Additional ADL Goal #3: Pt will follow one step commands with 25 % accuracy.  OT Frequency: Min 1X/week   Barriers to D/C:            Co-evaluation PT/OT/SLP Co-Evaluation/Treatment: Yes Reason for Co-Treatment: Complexity of the patient's impairments (multi-system involvement)   OT goals addressed during session: Strengthening/ROM      AM-PAC OT "6 Clicks" Daily Activity     Outcome Measure Help from another person eating meals?: Total Help from  another person taking care of personal grooming?: Total Help from another person toileting, which includes using toliet, bedpan, or urinal?: Total Help from another person bathing (including washing, rinsing, drying)?: Total Help from another person to put on and taking off regular upper body clothing?: Total Help from another person to put on and taking off regular lower body clothing?: Total 6 Click Score: 6   End of Session    Activity Tolerance: Patient tolerated treatment well Patient left: in bed;with call bell/phone within reach;with bed alarm set  OT Visit Diagnosis: Pain;Muscle weakness (generalized) (M62.81)                Time: 8119-1478 OT Time Calculation (min): 32 min Charges:  OT General Charges $OT Visit: 1 Visit Re-evaluation  Nestor Lewandowsky, OTR/L Acute Rehabilitation Services Pager: [phone] Office: [phone]  Malka So 10/06/2018, 10:37 AM

## 2018-10-06 NOTE — Progress Notes (Signed)
[place] for heparin Indication: atrial fibrillation / DVT  Allergies  Allergen Reactions  . Ace Inhibitors Cough    Patient Measurements: TBW 60.8 kg Heparin Dosing Weight: 58 kg  Vital Signs: Temp: 100.6 F (38.1 C) (01/08 0700) Temp Source: (P) Esophageal (01/08 0400) BP: 148/32 (01/08 0700) Pulse Rate: 104 (01/08 0700)  Labs: Recent Labs    10/04/18 0159 10/04/18 0923 10/05/18 0338 10/06/18 0350  HGB 8.8*  --  8.7* 8.2*  HCT 28.5*  --  29.1* 26.5*  PLT 331  --  297 316  APTT  --  95* 75* 51*  HEPARINUNFRC  --  0.71* 0.53 0.34  CREATININE 4.13*  --  5.05* 2.95*    Estimated Creatinine Clearance: 12.4 mL/min (A) (by C-G formula based on SCr of 2.95 mg/dL (H)).   Medical History: Past Medical History:  Diagnosis Date  . Anemia   . Arthritis   . Asthma   . Atrial fibrillation ([place])   . Atrial flutter ([place])    a. s/p TEE/DCCV December 2013 b. 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 Bone spur of toe    Right 5th toe  . Chronic diastolic CHF (congestive heart failure) ([facility])    a. EF 50-55% by echo in 2015  . Colon polyposis   . COPD (chronic obstructive pulmonary disease) ([place])   . DVT of upper extremity (deep vein thrombosis) ([place])    Right basilic vein, June 3419  . ESRD (end stage renal disease) on dialysis ([place])    "TTF; [place]; off [employer]." (09/07/2018)  . Essential hypertension, benign   . Fractures   . Glaucoma   . Lupus ([place])   . Pericarditis   . SLE (systemic lupus erythematosus) ([place])   . Type 2 diabetes mellitus ([place])   . UTI (lower urinary tract infection)   . Wound of right leg 06/2017    Assessment: 73 yo F presents with wound infection. On Eliquis PTA for DVT and Afib- last dose was 1/4 at 1000.   Eliquis held due to  suspicion for GIB.  Pharmacy asked to restart anticoagulation with IV heparin.  Heparin level at goal this morning.  No overt bleeding or complications noted.  Goal of Therapy:  Heparin level 0.3-0.7 Monitor platelets by anticoagulation protocol: Yes   Plan:  Continue heparin gtt at 550 units/hr  Monitor daily heparin level, CBC, s/s of bleed  [name], Roylene Reason, [facility] Clinical Pharmacist Phone [phone]  10/06/2018 8:28 AM

## 2018-10-06 NOTE — Progress Notes (Signed)
Pharmacy Antibiotic Note  [name] is a 73 y.o. female admitted on 09/24/2018 with sepsis.  Pharmacy has been consulted for vancomycin and cefepime dosing, then narrowed to meropenem for ESBL PNA.  Plan: Continue Meropenem 500 mg q 24 hrs through 10/11/2018 per ID recommendations.  Height: 5' (152.4 cm) Weight: 116 lb 2.9 oz (52.7 kg) IBW/kg (Calculated) : 45.5  Temp (24hrs), Avg:99.7 F (37.6 C), Min:98.2 F (36.8 C), Max:100.9 F (38.3 C)  Recent Labs  Lab 10/01/18 1409 10/02/18 0333  10/03/18 0204 10/03/18 0614 10/03/18 1118 10/03/18 1247 10/03/18 1527 10/04/18 0159 10/05/18 0338 10/06/18 0350  WBC  --  15.9*   < >  --  23.6* 23.9*  --   --  31.4* 40.2* 31.6*  CREATININE  --  4.45*  --  2.56*  --   --   --   --  4.13* 5.05* 2.95*  LATICACIDVEN 1.7  --   --   --   --   --  1.2 1.3  --   --   --    < > = values in this interval not displayed.    Estimated Creatinine Clearance: 12.4 mL/min (A) (by C-G formula based on SCr of 2.95 mg/dL (H)).    Allergies  Allergen Reactions  . Ace Inhibitors Cough   Antimicrobials this admission:  Cefepime 1/3 >> 1/5 Vancomycin 1/3 1/6 Fluconazole 1/4 > 1/5 Meropenem 1/5 > (1/13)  Dose adjustments this admission:   Microbiology results:  1/7 Bcx x 2: ngtd 1/3 BCx x 2: neg 1/3 Sputum: abundant Ecoli ESBL- MDR; senstv to meropenem  1/1 UCx: multiple species 12/27 BCx  - neg  Thank you for allowing pharmacy to be a part of this patient's care.  Marguerite Olea, [facility] Clinical Pharmacist Phone [phone]  10/06/2018 8:31 AM

## 2018-10-06 NOTE — Progress Notes (Addendum)
NAME:  [name], MRN:  [id number], DOB:  01-09-1946, LOS: 16 ADMISSION DATE:  09/24/2018, CONSULTATION DATE: 10/01/2018 REFERRING MD: Dr. Sarajane Jews, CHIEF COMPLAINT: Acute respiratory failure  Brief History   73 year old woman with end-stage renal disease, peripheral vascular disease, diabetes, hypertension with diastolic dysfunction.  Status post left AKA 09/26/2018 for LLE ischemia and metatarsal wound.  Poor p.o. intake, progressive weakness, A. fib since the procedure.  More more lethargy and difficulty with airway protection, now with pooling secretions and increased work of breathing. PCCM consulted for evolving resp failure.   History of present illness   73 year old chronically ill woman with end-stage renal disease on hemodialysis, peripheral vascular disease, COPD, diabetes, hypertension with associated diastolic dysfunction and CHF, SLE on chronic prednisone and Plaquenil, atrial fibrillation on amiodarone and Eliquis.  She has a history of a right AKA in 05/2018, more recently a left transmetatarsal amputation 09/06/2018.  She was admitted on 12/27 with gangrene of that foot.  She was treated with vancomycin and ceftriaxone, underwent left AKA on 12/29.  Subsequent course complicated by poor p.o. intake in the setting malnutrition, oral thrush and right flank pain.  Significant difficulty with secretion clearance, evolving poor cough and increased work of breathing.  Was felt that she was experiencing an overall decline and palliative care medicine was consulted.  On 1/3 she was progressively weak, unable to protect airway adequately or clear secretions at all.  Developing some degree of encephalopathy and showing evidence for respiratory failure. Patient's stated wishes are to undergo all potential life-saving therapies, confirmed to me by her husband at bedside today.  We performed NT suctioning and obtained copious thick green secretions were posterior pharynx. Plan intubation for  airway protection and MV.   Past Medical History   has a past medical history of Anemia, Arthritis, Asthma, Atrial fibrillation ([place]), Atrial flutter ([place]), Bone spur of toe, Chronic diastolic CHF (congestive heart failure) ([place]), Colon polyposis, COPD (chronic obstructive pulmonary disease) ([place]), DVT of upper extremity (deep vein thrombosis) ([place]), ESRD (end stage renal disease) on dialysis [facility]), Essential hypertension, benign, Fractures, Glaucoma, Lupus ([place]), Pericarditis, SLE (systemic lupus erythematosus) ([place]), Type 2 diabetes mellitus ([place]), UTI (lower urinary tract infection), and Wound of right leg (06/2017).   Significant Hospital Events   12/27 admission with left lower extremity gangrenous wound 12/29 left AKA 1/3 transfer to ICU and intubated 1/4 OGT with bright red blood  Consults:  Vascular surgery Nephrology Palliative care medicine Infectious disease  Procedures:  LUE fistula (pre-admit) R [place] tunneled HD catheter (pre-admit) Left AKA 12/29  Significant Diagnostic Tests:  Head CT 12/27 >> stable small left parafalcine meningioma, chronic small vessel disease, nothing acute Right hip/pelvis x-ray 12/28 >> no pelvic or hip fracture, mild to moderate DJD of both hips EEG 1/2 >> global slowing without any epileptiform activity  Micro Data:  Blood cultures 12/27 >> negative Blood cultures 1/3 >> Negative Urine 1/1 >> multiple species, nondiagnostic Resp Cx 1/3>> ESBL E.coli Blood cultures 1/7 >>   Antimicrobials:  Cefepime 12/27 >> 1/1; 1/3 >>  1/5 Vancomycin 12/27 >> 1/1; 1/3 >> 1/5 Fluconazole 1/2 >>   Flagyl 1/3 >>  1/5 Meropenem 1/5 >>   Interim history/subjective:  Patient was sitting in bed, no family at bedside.  No acute events overnight. Underwent hemodialysis yesterday but developed a short run of SVT and was unable to complete the whole course. She is still not following any commands.  Does not appear to be in distress.  Did have some diarrhea this  morning.  Objective   Blood pressure (!) 127/34, pulse (!) 103, temperature (!) 100.6 F (38.1 C), resp. rate (!) 24, height 5' (1.524 m), weight 52.7 kg, SpO2 100 %.    Vent Mode: PRVC FiO2 (%):  [40 %] (P) 40 % Set Rate:  [16 bmp-18 bmp] 18 bmp Vt Set:  [360 mL] 360 mL PEEP:  [5 cmH20] 5 cmH20 Pressure Support:  [10 cmH20] 10 cmH20 Plateau Pressure:  [13 cmH20-20 cmH20] 13 cmH20   Intake/Output Summary (Last 24 hours) at 10/06/2018 0706 Last data filed at 10/06/2018 0600 Gross per 24 hour  Intake 1540.83 ml  Output -150 ml  Net 1690.83 ml   Filed Weights   10/03/18 0200 10/05/18 0447 10/06/18 0406  Weight: 50.2 kg 49.4 kg 52.7 kg    Physical Exam: General: Ill-appearing female, awake, opens eyes, nods and shakes head to questions, intermittently follows commands, no acute distress, intubated HENT: [place], AT, ETT in place, OGT with green output Eyes: EOMI, no scleral icterus Respiratory: Lungs CTA Cardiovascular: RRR, -M/R/G, no JVD GI: Normoactive bowel sounds, soft, nontender Extremities: old right AKA, new left AKA with clean, dry dressing place Neuro: Awake but not interactive, intermittently following commands  CXR 10/02/18 independently reviewed by me - mild interstitial pulm edema, LLL atelectasis  Resolved Hospital Problem list     Assessment & Plan:   Acute multifactorial encephalopathy, chiefly metabolic2/2 sepsis Right forearm twitching noted earlier in hospitalization, EEG reassuring Depression ? Seizures  Awake, still not following commands, has had almost no improvement over the past few days.  No improvement after dialysis was done yesterday.  CT head was unremarkable..  EEG showed PLEDs, unclear etiology however may be related to infection.  Patient is fevers overnight, has had some improvement in her WBC but given the fact that she has had no improvement in her mental status we will obtain CT abdomen pelvis today to evaluate for possible abscess.  -Continuing  to hold home carbamazepine. Hold Zoloft. This medication was started as an inpatient -Repeat CT head showed stable findings, no acute findings, atrophy with chronic small vessel ischemia -EEG showed periodic lateralized epileptiform discharges. Appears that this can be related to infection, CJD, anoxia, tumor, or hemorrhages.  - Will hold off on further imaging, if she is still febrile and has an increase in her leukocytosis we can do further testing wth Ct chest/abd/pelvis -Clinically monitor  Septic shock secondary to ESBL E. coli pneumonia   Acute respiratory failure COPD Sputum culture grew out ESBL.  Remained afebrile overnight. WBC elevated to 40 today. Will continue treatment for E Coli pneumonia, trend WBCs and fever curve.  She is still having elevation of white blood cell count minimally responsive.  Will reevaluate after hemodialysis to assess mental status.  -If patient's leukocytosis continues to increase will likely obtain CT chest/abdomen/pelvis tomorrow -Blood cultures NGTD x3 days -Continue mechanical ventilation -Continue meropenem x8 days -Repeat blood cultures  >>> NGTD x1 day -Continue Solu-cortef BID -Bronchodilators  -SBT daily -Check C. diff  Transaminitis: -Patient had an elevation in her AST and ALT up to 174 and 173 respectively.  Increased from her labs on 12/27. Alk Phos NL. Hepatitis B in 07/2018 was negative. -Patient may have developed a shock liver secondary to septic shock, will repeat CMP tomorrow to follow trend.  -CMP  UGIB in setting of anticoagulation likely secondary to NG tube trauma Chronic right-sided abdominal pain No BRB out of OG tube today,  Hgb has been stable.  -Continue heparin drip for anticoagulation for A. Fib, need to transition back to Eliquis -Trend CBC  -Continue IV PPI BID   Chronic anemia Likely related to ESRD.  Hemoglobin has been stable -Weekly aranesp -Trend CBC as above  End-stage renal disease, HD TTS Hypokalemia,  mild -HD per nephrology recommendations. Last dialysis was 1/7. HD tomorrow. -K today was 3.1, received HD yesterday -Phos 2.1, will replete  Hypoalbuminemia: Albumin today was 1.3.  -Continue tube feeds -Continue pro-stat -Nutrition consult  Recent LLE AKA for ischemia/gangrene  Prior RLE AKA Peripheral vascular disease S/p LLE AKA on 12/29 -Continue wound consult  Oral candidiasis -Fluconazole T-Th-S via HD  Hypertension Chronic diastolic CHF Paroxysmal atrial fibrillation -Continue Amiodarone -Continue heparin drip while inpatient -Hold apixaban   Diabetes --Sliding scale insulin per protocol -CBGs have been elevated, will add long acting insulin today  SLE Outpatient regimen prednisone and Plaquenil --Hold Plaquenil given concern for possible sepsis --Hold PO prednisone.Continue solu-cortef BID  3 cm right upper pole renal mass of unclear significance Not clear that any biopsy has been performed.  Question whether this may be contributing to current failure to thrive, presentation.  Consider repeat CT abdomen when stable to do so if we believe this will help with prognostication and overall goals for care.   Best practice:  Diet: Tube feeds Pain/Anxiety/Delirium protocol (if indicated): intermittent versed, fentanyl  VAP protocol (if indicated): yes DVT prophylaxis: Heparin GI prophylaxis: PPI Glucose control: SSI Mobility: bedrest Code Status: Full  Family Communication: Discussed with husband Disposition: Remain in ICU    Labs   CBC: Recent Labs  Lab 10/03/18 0614 10/03/18 1118 10/04/18 0159 10/05/18 0338 10/06/18 0350  WBC 23.6* 23.9* 31.4* 40.2* 31.6*  HGB 8.3* 8.5* 8.8* 8.7* 8.2*  HCT 28.0* 28.3* 28.5* 29.1* 26.5*  MCV 87.8 87.9 86.6 85.6 86.9  PLT 278 283 331 297 778    Basic Metabolic Panel: Recent Labs  Lab 10/02/18 0333 10/03/18 0204 10/04/18 0159 10/04/18 1639 10/05/18 0338 10/05/18 1621 10/06/18 0350  NA 140 134* 135  --  136   --  135  K 5.4* 3.6 4.4  --  4.3  --  3.1*  CL 104 97* 99  --  100  --  100  CO2 24 21* 19*  --  21*  --  24  GLUCOSE 141* 135* 170*  --  220*  --  391*  BUN 43* 22 49*  --  73*  --  42*  CREATININE 4.45* 2.56* 4.13*  --  5.05*  --  2.95*  CALCIUM 8.4* 7.9* 8.3*  --  8.6*  --  8.0*  MG  --   --   --  1.9 2.1 1.9 1.8  PHOS 4.2 4.2 4.4 5.3* 5.7* 2.0* 2.1*   GFR: Estimated Creatinine Clearance: 12.4 mL/min (A) (by C-G formula based on SCr of 2.95 mg/dL (H)). Recent Labs  Lab 10/01/18 1409  10/03/18 1118 10/03/18 1247 10/03/18 1527 10/04/18 0159 10/05/18 0338 10/06/18 0350  WBC  --    < > 23.9*  --   --  31.4* 40.2* 31.6*  LATICACIDVEN 1.7  --   --  1.2 1.3  --   --   --    < > = values in this interval not displayed.    Liver Function Tests: Recent Labs  Lab 10/02/18 2423 10/03/18 0204 10/04/18 0159 10/05/18 0338 10/06/18 0350  AST  --   --   --   --  174*  ALT  --   --   --   --  175*  ALKPHOS  --   --   --   --  107  BILITOT  --   --   --   --  0.6  PROT  --   --   --   --  6.1*  ALBUMIN 1.5* 1.5* 1.5* 1.4* 1.3*   Recent Labs  Lab 10/02/18 0333  LIPASE 19   No results for input(s): AMMONIA in the last 168 hours.  ABG    Component Value Date/Time   PHART 7.367 10/01/2018 1838   PCO2ART 48.0 10/01/2018 1838   PO2ART 158.0 (H) 10/01/2018 1838   HCO3 27.5 10/01/2018 1838   TCO2 29 10/01/2018 1838   ACIDBASEDEF 5.4 (H) 11/03/2017 1025   O2SAT 99.0 10/01/2018 1838     Coagulation Profile: Recent Labs  Lab 10/01/18 1409  INR 1.45    CBG: Recent Labs  Lab 10/05/18 1122 10/05/18 1535 10/05/18 1926 10/06/18 0037 10/06/18 0348  GLUCAP 367* 152* 196* 296* 360*     Asencion Noble, M.D. PGY1 Pager [phone] 10/06/2018 7:06 AM

## 2018-10-06 NOTE — Evaluation (Signed)
Physical Therapy Re-Evaluation Patient Details Name: [name] MRN: [id number] DOB: 11/06/1945 Today's Date: 10/06/2018   History of Present Illness  [name] is a 73 y.o. female with medical history significant of hypertension, hyperlipidemia, diabetes mellitus, COPD, GERD, gout, and anemia, atrial fibrillation and DVT on Eliquis, ESRD-HD (TTS), lupus, PAD, R AKA (05/2018), now s/p L AKA. Pt with AMS and lethargy 10/01/18 transferred to ICU and intubated.   Clinical Impression  Patient re-evaluated in setting of change of medical status. Pt intubated and responding yes/no intermittently; she does not follow commands. Pt requiring two person total assistance for all aspects of bed mobility and static seated balance on edge of bed x 15 minutes. Vital signs stable on 40% FiO2, 5 PEEP. See below for follow up recommendations.     Follow Up Recommendations SNF v LTACH    Equipment Recommendations  Other (comment)(defer)    Recommendations for Other Services       Precautions / Restrictions Precautions Precautions: Fall Precaution Comments: intubated, flexiseal Restrictions Weight Bearing Restrictions: No Other Position/Activity Restrictions: pt s/p B AKA      Mobility  Bed Mobility Overal bed mobility: Needs Assistance Bed Mobility: Rolling;Supine to Sit;Sit to Supine Rolling: +2 for physical assistance;Total assist   Supine to sit: +2 for physical assistance;Total assist Sit to supine: +2 for physical assistance;Total assist   General bed mobility comments: pt without attempt to assist  Transfers                 General transfer comment: deferred  Ambulation/Gait                Stairs            Wheelchair Mobility    Modified Rankin (Stroke Patients Only)       Balance Overall balance assessment: Needs assistance Sitting-balance support: Bilateral upper extremity supported Sitting balance-Leahy Scale: Zero Sitting balance -  Comments: posterior lean                                     Pertinent Vitals/Pain Pain Assessment: Faces Faces Pain Scale: Hurts little more Pain Location: buttocks Pain Descriptors / Indicators: Grimacing Pain Intervention(s): Monitored during session    Home Living Family/patient expects to be discharged to:: Skilled nursing facility                 Additional Comments: Pt was at [facility] for rehab PTA, before that she was at home and her niece came over daily to bathe and dress her    Prior Function Level of Independence: Needs assistance   Gait / Transfers Assistance Needed: non ambulatory, assisted transfers to wheelchair, [facility] using sliding board with at least 1 person assist, sometimes 2   ADL's / Homemaking Assistance Needed: assisted by family, patient use depends for urination, assisted to [facility] for bowel movements using sliding board. Niece comes to assist with sponge bathing.        Hand Dominance   Dominant Hand: Right    Extremity/Trunk Assessment   Upper Extremity Assessment Upper Extremity Assessment: Defer to OT evaluation RUE Deficits / Details: performed PROM RUE Coordination: decreased fine motor;decreased gross motor LUE Deficits / Details: Performed PROM LUE Coordination: decreased fine motor;decreased gross motor    Lower Extremity Assessment RLE Deficits / Details: R AKA LLE Deficits / Details: L AKA (new)    Cervical / Trunk Assessment  Cervical / Trunk Assessment: Kyphotic(with forward head)  Communication   Communication: Other (comment)(intubated)  Cognition Arousal/Alertness: Awake/alert Behavior During Therapy: Flat affect Overall Cognitive Status: Difficult to assess                                 General Comments: pt nodding to yes/no questions and shrugging shoulders, not truly following commands      General Comments      Exercises     Assessment/Plan    PT Assessment Patient needs  continued PT services  PT Problem List Decreased strength;Decreased activity tolerance;Decreased balance;Decreased mobility;Pain       PT Treatment Interventions DME instruction;Functional mobility training;Therapeutic activities;Therapeutic exercise;Balance training;Wheelchair mobility training;Patient/family education    PT Goals (Current goals can be found in the Care Plan section)  Acute Rehab PT Goals Patient Stated Goal: pt unable to state, but agreeable to therapy  PT Goal Formulation: Patient unable to participate in goal setting Time For Goal Achievement: 10/20/18 Potential to Achieve Goals: Fair    Frequency Min 2X/week   Barriers to discharge        Co-evaluation PT/OT/SLP Co-Evaluation/Treatment: Yes Reason for Co-Treatment: Complexity of the patient's impairments (multi-system involvement);For patient/therapist safety;To address functional/ADL transfers PT goals addressed during session: Mobility/safety with mobility;Balance OT goals addressed during session: Strengthening/ROM       AM-PAC PT "6 Clicks" Mobility  Outcome Measure Help needed turning from your back to your side while in a flat bed without using bedrails?: Total Help needed moving from lying on your back to sitting on the side of a flat bed without using bedrails?: Total Help needed moving to and from a bed to a chair (including a wheelchair)?: Total Help needed standing up from a chair using your arms (e.g., wheelchair or bedside chair)?: Total Help needed to walk in hospital room?: Total Help needed climbing 3-5 steps with a railing? : Total 6 Click Score: 6    End of Session   Activity Tolerance: Patient limited by lethargy Patient left: in bed;with call bell/phone within reach;with bed alarm set;with family/visitor present Nurse Communication: Mobility status PT Visit Diagnosis: Other abnormalities of gait and mobility (R26.89);Muscle weakness (generalized) (M62.81);Difficulty in walking, not  elsewhere classified (R26.2);Pain Pain - Right/Left: Left Pain - part of body: Leg    Time: 9379-0240 PT Time Calculation (min) (ACUTE ONLY): 37 min   Charges:   PT Evaluation $PT Re-evaluation: 1 Re-eval          Ellamae Sia, PT, DPT Acute Rehabilitation Services Pager [phone] Office [phone]   Willy Eddy 10/06/2018, 12:46 PM

## 2018-10-06 NOTE — Progress Notes (Addendum)
Inpatient Diabetes Program Recommendations  AACE/ADA: New Consensus Statement on Inpatient Glycemic Control (2015)  Target Ranges:  Prepandial:   less than 140 mg/dL      Peak postprandial:   less than 180 mg/dL (1-2 hours)      Critically ill patients:  140 - 180 mg/dL   Lab Results  Component Value Date   GLUCAP 289 (H) 10/06/2018   HGBA1C 4.5 (L) 09/06/2018     Results for ZAYNAB, CHIPMAN (MRN [id number]) as of 10/06/2018 10:30  Ref. Range 10/04/2018 20:10 10/04/2018 2100 10/04/2018 23:01 10/05/2018 04:05 10/05/2018 08:15 10/05/2018 11:22 10/05/2018 15:35 10/05/2018 19:26 10/06/2018 00:37 10/06/2018 03:48 10/06/2018 08:01  Glucose-Capillary Latest Ref Range: 70 - 99 mg/dL 87 Tube feeding inititated 145 (H)  Novolog 1 unit 242 (H)  Novolog 3 units 247 (H)  Novolog 3 units 367 (H)  Novolog 3 units  152 (H)  Novolog 2 units 196 (H)  Novolog 2 units 296 (H)  Novolog 5 units 360 (H)  Novolog 9 units  289 (H)  Novolog 5 units     DM2  Home DM meds: Novolog 5 units tid if CBG over 100mg /dl  Currrent DM meds: Lantus 5 units daily (to start today)                                 Novolog sensitive scale (0-9 units) Q4 hours  Solucortef 50 mg Q 12 hours. Once steroids dose reduced most likely insulin needs will decrease.  Tube feeds at 40 ml/hr.  CBG have steadily increased since tube feeding began on 1/6. Past 24 hours patient has had 29 units of Novolog correction. Note order for Lantus 5 units to start today. Also, suggest Novolog tube feed coverage while on tube feeds.     MD please consider the following inpatient diabetes recommendations:  Novolog 3 units Q 4 hours tube feed coverage. Hold if tube feeds stopped.   Thank you.  -- Will follow during hospitalization.--  Jonna Clark RN, MSN Diabetes Coordinator Inpatient Glycemic Control Team Team Pager: [phone] (8am-5pm)

## 2018-10-06 NOTE — Plan of Care (Signed)
  Problem: Nutrition: Goal: Adequate nutrition will be maintained Outcome: Progressing   Problem: Coping: Goal: Level of anxiety will decrease Outcome: Progressing   Problem: Elimination: Goal: Will not experience complications related to bowel motility Outcome: Progressing   Problem: Pain Managment: Goal: General experience of comfort will improve Outcome: Progressing   Problem: Safety: Goal: Ability to remain free from injury will improve Outcome: Progressing   

## 2018-10-07 ENCOUNTER — Inpatient Hospital Stay (HOSPITAL_COMMUNITY): Payer: Medicare Other

## 2018-10-07 LAB — CBC
HCT: 24.4 % — ABNORMAL LOW (ref 36.0–46.0)
Hemoglobin: 7.4 g/dL — ABNORMAL LOW (ref 12.0–15.0)
MCH: 26.6 pg (ref 26.0–34.0)
MCHC: 30.3 g/dL (ref 30.0–36.0)
MCV: 87.8 fL (ref 80.0–100.0)
NRBC: 0.6 % — AB (ref 0.0–0.2)
Platelets: 301 10*3/uL (ref 150–400)
RBC: 2.78 MIL/uL — ABNORMAL LOW (ref 3.87–5.11)
RDW: 19.2 % — ABNORMAL HIGH (ref 11.5–15.5)
WBC: 33.8 10*3/uL — AB (ref 4.0–10.5)

## 2018-10-07 LAB — RENAL FUNCTION PANEL
Albumin: 1.2 g/dL — ABNORMAL LOW (ref 3.5–5.0)
Anion gap: 13 (ref 5–15)
BUN: 79 mg/dL — ABNORMAL HIGH (ref 8–23)
CO2: 22 mmol/L (ref 22–32)
Calcium: 8.3 mg/dL — ABNORMAL LOW (ref 8.9–10.3)
Chloride: 101 mmol/L (ref 98–111)
Creatinine, Ser: 3.86 mg/dL — ABNORMAL HIGH (ref 0.44–1.00)
GFR calc Af Amer: 13 mL/min — ABNORMAL LOW (ref >60)
GFR calc non Af Amer: 11 mL/min — ABNORMAL LOW (ref >60)
GLUCOSE: 277 mg/dL — AB (ref 70–99)
Phosphorus: 2.6 mg/dL (ref 2.5–4.6)
Potassium: 3.5 mmol/L (ref 3.5–5.1)
Sodium: 136 mmol/L (ref 135–145)

## 2018-10-07 LAB — GLUCOSE, CAPILLARY
GLUCOSE-CAPILLARY: 293 mg/dL — AB (ref 70–99)
Glucose-Capillary: 326 mg/dL — ABNORMAL HIGH (ref 70–99)
Glucose-Capillary: 233 mg/dL — ABNORMAL HIGH (ref 70–99)
Glucose-Capillary: 227 mg/dL — ABNORMAL HIGH (ref 70–99)
Glucose-Capillary: 222 mg/dL — ABNORMAL HIGH (ref 70–99)

## 2018-10-07 LAB — APTT: APTT: 51 s — AB (ref 24–36)

## 2018-10-07 LAB — HEPATIC FUNCTION PANEL
ALT: 91 U/L — ABNORMAL HIGH (ref 0–44)
AST: 52 U/L — ABNORMAL HIGH (ref 15–41)
Albumin: 1.2 g/dL — ABNORMAL LOW (ref 3.5–5.0)
Alkaline Phosphatase: 93 U/L (ref 38–126)
Bilirubin, Direct: <0.1 mg/dL (ref 0.0–0.2)
Total Bilirubin: 0.6 mg/dL (ref 0.3–1.2)
Total Protein: 5.7 g/dL — ABNORMAL LOW (ref 6.5–8.1)

## 2018-10-07 LAB — HEPARIN LEVEL (UNFRACTIONATED)
HEPARIN UNFRACTIONATED: 0.26 [IU]/mL — AB (ref 0.30–0.70)
Heparin Unfractionated: 0.24 IU/mL — ABNORMAL LOW (ref 0.30–0.70)
Heparin Unfractionated: 0.46 IU/mL (ref 0.30–0.70)

## 2018-10-07 MED ORDER — VANCOMYCIN 50 MG/ML ORAL SOLUTION
125.0000 mg | Freq: Four times a day (QID) | ORAL | Status: AC
  Administered 2018-10-07 – 2018-10-17 (×37): 125 mg
  Filled 2018-10-07 (×47): qty 2.5

## 2018-10-07 MED ORDER — HEPARIN SODIUM (PORCINE) 1000 UNIT/ML IJ SOLN
3.2000 mL | Freq: Once | INTRAMUSCULAR | Status: AC
  Administered 2018-10-07: 3200 [IU] via INTRAVENOUS

## 2018-10-07 MED ORDER — HEPARIN SODIUM (PORCINE) 1000 UNIT/ML IJ SOLN
INTRAMUSCULAR | Status: AC
  Filled 2018-10-07: qty 4

## 2018-10-07 MED ORDER — INSULIN ASPART 100 UNIT/ML ~~LOC~~ SOLN
3.0000 [IU] | SUBCUTANEOUS | Status: DC
  Administered 2018-10-07 – 2018-10-09 (×15): 3 [IU] via SUBCUTANEOUS

## 2018-10-07 MED ORDER — PREDNISONE 10 MG PO TABS
5.0000 mg | ORAL_TABLET | Freq: Every day | ORAL | Status: DC
  Administered 2018-10-08 – 2018-10-11 (×4): 5 mg via ORAL
  Filled 2018-10-07 (×4): qty 1

## 2018-10-07 NOTE — Consult Note (Signed)
[place] Nurse wound follow up: Contacted by charge [employer], H. who has identified areas of intertriginous areas of moisture accumulation in the bilateral inguinal areas of this patient. Wound type: Moisture We will provide guidance to Nursing via the Orders for the management of this skin issue using our house antimicrobial textile, InterDry Ag+.   [place] nursing team will continue to follow, and will remain available to this patient, the nursing and medical teams.    Thank you, Maudie Flakes, MSN, RN, Chancy Milroy, Arther Abbott  Pager# [phone]

## 2018-10-07 NOTE — Progress Notes (Signed)
Inpatient Diabetes Program Recommendations  AACE/ADA: New Consensus Statement on Inpatient Glycemic Control (2015)  Target Ranges:  Prepandial:   less than 140 mg/dL      Peak postprandial:   less than 180 mg/dL (1-2 hours)      Critically ill patients:  140 - 180 mg/dL   Lab Results  Component Value Date   GLUCAP 227 (H) 10/07/2018   HGBA1C 4.5 (L) 09/06/2018    Review of Glycemic Control Results for [name], [name] (MRN [id number]) as of 10/07/2018 13:25  Ref. Range 10/06/2018 20:25 10/07/2018 00:51 10/07/2018 04:39 10/07/2018 08:12 10/07/2018 12:07  Glucose-Capillary Latest Ref Range: 70 - 99 mg/dL 350 (H) 326 (H) 293 (H) 233 (H) 227 (H)   Diabetes history: DM2 Outpatient Diabetes medications: Novolog 5 units tid with meals if blood sugar>100 mg/dL Current orders for Inpatient glycemic control:  Novolog sensitive q 4 hours, Solucortef 50 mg IV q 12 hours, Lantus 5 units daily, Novolog 3 units q 4 hours (tube feed coverage) Inpatient Diabetes Program Recommendations:   Note tube feed coverage started today. Consider increasing Lantus to 5 units bid.    Thanks,  Adah Perl, RN, BC-ADM Inpatient Diabetes Coordinator Pager [phone] (8a-5p)

## 2018-10-07 NOTE — Progress Notes (Signed)
RT NOTE. Pt was unavailable for CPT at this time. Will attempt at 16:00.

## 2018-10-07 NOTE — Progress Notes (Addendum)
[facility] Progress Note   Subjective:   Remains intubated   Objective Vitals:   10/07/18 0945 10/07/18 1000 10/07/18 1100 10/07/18 1123  BP: (!) 141/39 (!) 138/39 (!) 146/32 (!) 137/41  Pulse: (!) 109 (!) 108 89 91  Resp: (!) 23 (!) 24 15 (!) 22  Temp: 99.7 F (37.6 C) 99.7 F (37.6 C) 100.2 F (37.9 C)   TempSrc:   Esophageal   SpO2: 100% 100%  100%  Weight:      Height:       Physical Exam  General: on vent, asleep Heart: RRR 2/6 SEM  Lungs: clear bilat  Abdomen: soft NT/ND Extremities: bilat amp. NO stump edema Dialysis Access: R chest [facility]  Dialysis Orders:  [facility] TTS  3.5h  61kg   400/800  Hep 1800  R TDC Mircera 225 q2weeks (last 09/14/18)   Assessment:  1. Gangrene sp L AKA 12/29 2. ESBL EColi PNA treated 3. Septic shock resolved 4. TME/ ? Seizures 5. CDif infection po vanc 6. ESRDon HD 7. VDRF 8. Anemia / ckd +ABLA - Aranesp 200 q Saturday 9. MBD ckd -No VDRA - On phoslo 667 TID. P stable 10. Volume - is sig below EDW here 11. Nutrition/PCM. Very low albumin. Poor PO intake  supplements/renal vit. 12. SLE - plaquenil/solucortef  13. DM per primarySSI 14. Access - LUE BVT occluded, per VVS needs upper left arm AVG when more stable 15. Thrush - current meds 16. Afib- amiodarone/resumed apixaban.   Plan: 1. HD today in ICU small UF 2. Cont supportive care  Kelly Splinter MD [facility] pgr [phone]   10/07/2018, 11:53 AM      Additional Objective Labs: Basic Metabolic Panel: Recent Labs  Lab 10/06/18 0350 10/06/18 2124 10/07/18 0355  NA 135 135 136  K 3.1* 3.6 3.5  CL 100 101 101  CO2 '24 22 22  ' GLUCOSE 391* 382* 277*  BUN 42* 70* 79*  CREATININE 2.95* 3.63* 3.86*  CALCIUM 8.0* 8.3* 8.3*  PHOS 2.1* 2.5 2.6   CBC: Recent Labs  Lab 10/03/18 1118 10/04/18 0159 10/05/18 0338 10/06/18 0350 10/07/18 0355  WBC 23.9* 31.4* 40.2* 31.6* 33.8*  HGB 8.5* 8.8* 8.7* 8.2* 7.4*  HCT 28.3* 28.5*  29.1* 26.5* 24.4*  MCV 87.9 86.6 85.6 86.9 87.8  PLT 283 331 297 316 301   Blood Culture    Component Value Date/Time   SDES BLOOD RIGHT ARM 10/05/2018 1621   SPECREQUEST  10/05/2018 1621    BOTTLES DRAWN AEROBIC ONLY Blood Culture adequate volume   CULT  10/05/2018 1621    NO GROWTH 2 DAYS Performed at [facility], [place] [address] [place], [place] [address]    REPTSTATUS PENDING 10/05/2018 1621    Cardiac Enzymes: No results for input(s): CKTOTAL, CKMB, CKMBINDEX, TROPONINI in the last 168 hours. CBG: Recent Labs  Lab 10/06/18 1616 10/06/18 2025 10/07/18 0051 10/07/18 0439 10/07/18 0812  GLUCAP 375* 350* 326* 293* 233*   Iron Studies: No results for input(s): IRON, TIBC, TRANSFERRIN, FERRITIN in the last 72 hours. Lab Results  Component Value Date   INR 1.45 10/01/2018   INR 1.17 09/26/2018   INR 1.18 06/08/2018   Medications: . sodium chloride 10 mL/hr at 10/06/18 2000  . sodium chloride    . sodium chloride    . feeding supplement (VITAL AF 1.2 CAL) 40 mL/hr at 10/06/18 2000  . fentaNYL infusion INTRAVENOUS Stopped (10/03/18 0807)  . heparin 650 Units/hr (10/07/18  1000)  . meropenem (MERREM) IV Stopped (10/06/18 1716)  . norepinephrine (LEVOPHED) Adult infusion     . amiodarone  200 mg Per Tube Daily  . brimonidine  1 drop Left Eye BID   And  . timolol  1 drop Left Eye BID  . calcium acetate  667 mg Oral TID WC  . chlorhexidine gluconate (MEDLINE KIT)  15 mL Mouth Rinse BID  . Chlorhexidine Gluconate Cloth  6 each Topical Q0600  . darbepoetin (ARANESP) injection - DIALYSIS  200 mcg Intravenous Q Sat-HD  . feeding supplement (PRO-STAT SUGAR FREE 64)  30 mL Oral BID  . heparin  1,800 Units Dialysis Once in dialysis  . insulin aspart  0-9 Units Subcutaneous Q4H  . insulin aspart  3 Units Subcutaneous Q4H  . insulin glargine  5 Units Subcutaneous Daily  . ipratropium-albuterol  3 mL Nebulization BID  . latanoprost  1 drop Both Eyes QHS  . mouth rinse   15 mL Mouth Rinse 10 times per day  . pantoprazole (PROTONIX) IV  40 mg Intravenous Q12H  . sodium chloride flush  3 mL Intravenous Q12H  . vancomycin  125 mg Per Tube QID

## 2018-10-07 NOTE — Progress Notes (Signed)
[place] for heparin Indication: atrial fibrillation / DVT  Allergies  Allergen Reactions  . Ace Inhibitors Cough    Patient Measurements: TBW 60.8 kg Heparin Dosing Weight: 58 kg  Vital Signs: Temp: 100 F (37.8 C) (01/09 0500) Temp Source: Esophageal (01/09 0000) BP: 144/31 (01/09 0500) Pulse Rate: 95 (01/09 0500)  Labs: Recent Labs    10/05/18 0338 10/06/18 0350 10/06/18 2124 10/07/18 0355  HGB 8.7* 8.2*  --  7.4*  HCT 29.1* 26.5*  --  24.4*  PLT 297 316  --  301  APTT 75* 51*  --  51*  HEPARINUNFRC 0.53 0.34  --  0.24*  CREATININE 5.05* 2.95* 3.63* 3.86*    Estimated Creatinine Clearance: 9.5 mL/min (A) (by C-G formula based on SCr of 3.86 mg/dL (H)). Assessment: 73 y.o. female with h/o Afib, Eliquis on hold, for heparin  Goal of Therapy:  Heparin level 0.3-0.7 Monitor platelets by anticoagulation protocol: Yes   Plan:  Increase Heparin  650 units/hr  Phillis Knack, PharmD, BCPS   10/07/2018 6:07 AM

## 2018-10-07 NOTE — Progress Notes (Signed)
NAME:  [name], MRN:  [id number], DOB:  [birth year], LOS: 79 ADMISSION DATE:  09/24/2018, CONSULTATION DATE: 10/01/2018 REFERRING MD: Dr. Sarajane Jews, CHIEF COMPLAINT: Acute respiratory failure  Brief History   73 year old woman with end-stage renal disease, peripheral vascular disease, diabetes, hypertension with diastolic dysfunction.  Status post left AKA 09/26/2018 for LLE ischemia and metatarsal wound.  Poor p.o. intake, progressive weakness, A. fib since the procedure.  More more lethargy and difficulty with airway protection, now with pooling secretions and increased work of breathing. PCCM consulted for evolving resp failure.   History of present illness   73 year old chronically ill woman with end-stage renal disease on hemodialysis, peripheral vascular disease, COPD, diabetes, hypertension with associated diastolic dysfunction and CHF, SLE on chronic prednisone and Plaquenil, atrial fibrillation on amiodarone and Eliquis.  She has a history of a right AKA in 05/2018, more recently a left transmetatarsal amputation 09/06/2018.  She was admitted on 12/27 with gangrene of that foot.  She was treated with vancomycin and ceftriaxone, underwent left AKA on 12/29.  Subsequent course complicated by poor p.o. intake in the setting malnutrition, oral thrush and right flank pain.  Significant difficulty with secretion clearance, evolving poor cough and increased work of breathing.  Was felt that she was experiencing an overall decline and palliative care medicine was consulted.  On 1/3 she was progressively weak, unable to protect airway adequately or clear secretions at all.  Developing some degree of encephalopathy and showing evidence for respiratory failure. Patient's stated wishes are to undergo all potential life-saving therapies, confirmed to me by her husband at bedside today.  We performed NT suctioning and obtained copious thick green secretions were posterior pharynx. Plan intubation for  airway protection and MV.   Past Medical History   has a past medical history of Anemia, Arthritis, Asthma, Atrial fibrillation ([place]), Atrial flutter ([place]), Bone spur of toe, Chronic diastolic CHF (congestive heart failure) ([place]), Colon polyposis, COPD (chronic obstructive pulmonary disease) ([place]), DVT of upper extremity (deep vein thrombosis) ([place]), ESRD (end stage renal disease) on dialysis [facility]), Essential hypertension, benign, Fractures, Glaucoma, Lupus ([place]), Pericarditis, SLE (systemic lupus erythematosus) ([place]), Type 2 diabetes mellitus ([place]), UTI (lower urinary tract infection), and Wound of right leg (06/2017).   Significant Hospital Events   12/27 admission with left lower extremity gangrenous wound 12/29 left AKA 1/3 transfer to ICU and intubated 1/4 OGT with bright red blood  Consults:  Vascular surgery Nephrology  Palliative care medicine Infectious disease   Procedures:  LUE fistula (pre-admit) R [place]agon tunneled HD catheter (pre-admit) Left AKA 12/29  Significant Diagnostic Tests:  Head CT 12/27 >> stable small left parafalcine meningioma, chronic small vessel disease, nothing acute Right hip/pelvis x-ray 12/28 >> no pelvic or hip fracture, mild to moderate DJD of both hips EEG 1/2 >> global slowing without any epileptiform activity  Micro Data:  Blood cultures 12/27 >> negative Blood cultures 1/3 >> Negative Urine 1/1 >> multiple species, nondiagnostic Resp Cx 1/3>> ESBL E.coli Blood cultures 1/7 >> NGTD x2days  Antimicrobials:  Cefepime 12/27 >> 1/1; 1/3 >>  1/5 Vancomycin 12/27 >> 1/1; 1/3 >> 1/5 Fluconazole 1/2 >>   Flagyl 1/3 >>  1/5 Meropenem 1/5 >>   Interim history/subjective:  [name] is receiving dialysis at the time of evaluation. Nurse reports no acute events overnight. No family at bedside. She appears comfortable and in NAD. Currently on pressure support.   Objective   Blood pressure (!) 134/35, pulse 96, temperature 100.2 F (37.9  C),  resp. rate 16, height 5' (1.524 m), weight 52.5 kg, SpO2 100 %.    Vent Mode: PRVC FiO2 (%):  [40 %] 40 % Set Rate:  [18 bmp] 18 bmp Vt Set:  [360 mL] 360 mL PEEP:  [5 cmH20] 5 cmH20 Pressure Support:  [10 cmH20] 10 cmH20 Plateau Pressure:  [12 cmH20-23 cmH20] 14 cmH20   Intake/Output Summary (Last 24 hours) at 10/07/2018 0645 Last data filed at 10/07/2018 0400 Gross per 24 hour  Intake 1463.61 ml  Output 0 ml  Net 1463.61 ml   Filed Weights   10/05/18 0447 10/06/18 0406 10/07/18 0441  Weight: 49.4 kg 52.7 kg 52.5 kg    Physical Exam: General: Ill-appearing female,opens eyes to speech, on dialysis, no acute distress, intubated, dialysis cathter being used HENT: Wake, AT, ETT in place, OGT Eyes: EOMI, no scleral icterus Respiratory: Bilateral rhonchi, normal work of breathing Cardiovascular: RRR, dialysis murmur, no JVD GI: Normoactive bowel sounds, soft, grimace to palpation Extremities: old right AKA, new left AKA with staples in place, no erythema or drainage Neuro: Awake but not interactive  CXR 10/02/18 independently reviewed by me - mild interstitial pulm edema, LLL atelectasis  Resolved Hospital Problem list     Assessment & Plan:   Acute multifactorial encephalopathy, chiefly metabolic2/2 sepsis Right forearm twitching noted earlier in hospitalization, EEG reassuring Depression ? Seizures  -Patient was getting dialysis, she was sleeping when I came in and opened her eyes to speech, not following commands. Later in the day she was more awake, intermittently following commands.  -CT head was unremarkable..  EEG showed PLEDs, unclear etiology however may be related to infection.  -Continuing to hold home carbamazepine. Hold Zoloft. This medication was started as an inpatient -Repeat CT head showed stable findings, no acute findings, atrophy with chronic small vessel ischemia -EEG showed periodic lateralized epileptiform discharges. Appears that this can be related to  infection, CJD, anoxia, tumor, or hemorrhages.  --Patient has been febrile and still is having a leukocytosis, now with + c diff, will hold off on further imaging since there is a new identified source of infection.  -Clinically monitor  Septic shock secondary to ESBL E. coli pneumonia   Acute respiratory failure COPD Sputum culture grew out ESBL. Will continue treatment for E Coli pneumonia, trend WBCs and fever curve. Patient was more awake yesterday, intermittently following commands. C. Diff + for the antigen, low/no toxin production however given the continuous fevers and leukocytosis we will treat for c diff.   -If patient's leukocytosis continues to increase will likely obtain CT chest/abdomen/pelvis tomorrow -Continue mechanical ventilation -Continue meropenem x8 days (Day 4/8) -Repeat blood cultures  >>> NGTD x2 day -Discontinue Solu-cortef BID -Bronchodilators  -SBT daily -C. Diff toxigenic PCR  >>> positive for antigen -Add oral vancomycin 153m QID  -Chest physiotherapy, vibrate bed  Transaminitis: -Patient had an elevation in her AST and ALT up to 174 and 173 respectively.  Increased from her labs on 12/27. Alk Phos NL. Hepatitis B in 07/2018 was negative. -Patient may have developed a shock liver secondary to septic shock, will repeat CMP tomorrow to follow trend.  -AST and ALT improved  UGIB in setting of anticoagulation likely secondary to NG tube trauma Chronic right-sided abdominal pain No BRB out of OG tube today, Hgb has been stable.  -Continue heparin drip for anticoagulation for A. Fib, need to transition back to Eliquis -Trend CBC  -Continue IV PPI BID   Chronic anemia Likely related to ESRD.  Hemoglobin has been stable -Weekly aranesp -Trend CBC as above  End-stage renal disease, HD TTS Hypokalemia, mild -HD per nephrology recommendations. Last dialysis was 1/7. HD tomorrow. -K today was 3.1, received HD yesterday -HD scheduled for  today  Hypoalbuminemia: Albumin today was 1.2.  -Continue tube feeds -Continue pro-stat -Nutrition consult  Recent LLE AKA for ischemia/gangrene  Prior RLE AKA Peripheral vascular disease S/p LLE AKA on 12/29 -Continue wound consult  Oral candidiasis -Fluconazole T-Th-S via HD  Hypertension Chronic diastolic CHF Paroxysmal atrial fibrillation -Continue Amiodarone -Continue heparin drip while inpatient -Hold apixaban   Diabetes: Stopping steroids today.  --Sliding scale insulin  --CBGs have been elevated --Lantus 5 units was added yesterday, continue this --Add novolog 3 units q4 with SSI  SLE Outpatient regimen prednisone and Plaquenil --Hold Plaquenil given concern for possible sepsis --Hold PO prednisone.Discontinue solu-cortef BID  3 cm right upper pole renal mass of unclear significance Not clear that any biopsy has been performed.  Question whether this may be contributing to current failure to thrive, presentation.  Consider repeat CT abdomen when stable to do so if we believe this will help with prognostication and overall goals for care.   Best practice:  Diet: Tube feeds Pain/Anxiety/Delirium protocol (if indicated): intermittent versed, fentanyl  VAP protocol (if indicated): yes DVT prophylaxis: Heparin GI prophylaxis: PPI Glucose control: SSI Mobility: bedrest Code Status: Full  Family Communication:  Disposition: Remain in ICU    Labs   CBC: Recent Labs  Lab 10/03/18 1118 10/04/18 0159 10/05/18 0338 10/06/18 0350 10/07/18 0355  WBC 23.9* 31.4* 40.2* 31.6* 33.8*  HGB 8.5* 8.8* 8.7* 8.2* 7.4*  HCT 28.3* 28.5* 29.1* 26.5* 24.4*  MCV 87.9 86.6 85.6 86.9 87.8  PLT 283 331 297 316 595    Basic Metabolic Panel: Recent Labs  Lab 10/04/18 0159 10/04/18 1639 10/05/18 0338 10/05/18 1621 10/06/18 0350 10/06/18 2124 10/07/18 0355  NA 135  --  136  --  135 135 136  K 4.4  --  4.3  --  3.1* 3.6 3.5  CL 99  --  100  --  100 101 101  CO2 19*   --  21*  --  _0 GLUCOSE 170*  --  220*  --  391* 382* 277*  BUN 49*  --  73*  --  42* 70* 79*  CREATININE 4.13*  --  5.05*  --  2.95* 3.63* 3.86*  CALCIUM 8.3*  --  8.6*  --  8.0* 8.3* 8.3*  MG  --  1.9 2.1 1.9 1.8  --   --   PHOS 4.4 5.3* 5.7* 2.0* 2.1* 2.5 2.6   GFR: Estimated Creatinine Clearance: 9.5 mL/min (A) (by C-G formula based on SCr of 3.86 mg/dL (H)). Recent Labs  Lab 10/01/18 1409  10/03/18 1247 10/03/18 1527 10/04/18 0159 10/05/18 0338 10/06/18 0350 10/07/18 0355  WBC  --    < >  --   --  31.4* 40.2* 31.6* 33.8*  LATICACIDVEN 1.7  --  1.2 1.3  --   --   --   --    < > = values in this interval not displayed.    Liver Function Tests: Recent Labs  Lab 10/03/18 0204 10/04/18 0159 10/05/18 0338 10/06/18 0350 10/07/18 0355  AST  --   --   --  174*  --   ALT  --   --   --  175*  --   ALKPHOS  --   --   --  107  --   BILITOT  --   --   --  0.6  --   PROT  --   --   --  6.1*  --   ALBUMIN 1.5* 1.5* 1.4* 1.3* 1.2*   Recent Labs  Lab 10/02/18 0333  LIPASE 19   No results for input(s): AMMONIA in the last 168 hours.  ABG    Component Value Date/Time   PHART 7.367 10/01/2018 1838   PCO2ART 48.0 10/01/2018 1838   PO2ART 158.0 (H) 10/01/2018 1838   HCO3 27.5 10/01/2018 1838   TCO2 29 10/01/2018 1838   ACIDBASEDEF 5.4 (H) 11/03/2017 1025   O2SAT 99.0 10/01/2018 1838     Coagulation Profile: Recent Labs  Lab 10/01/18 1409  INR 1.45    CBG: Recent Labs  Lab 10/06/18 1137 10/06/18 1616 10/06/18 2025 10/07/18 0051 10/07/18 0439  GLUCAP 313* 375* 350* 326* 293*     Asencion Noble, M.D. PGY1 Pager [phone] 10/07/2018 6:45 AM

## 2018-10-07 NOTE — Progress Notes (Signed)
OG tube measured 86 cm, day shift was 64 cm external length. RN advanced OG tube- and ordered Addominal X ray for placement. Tube feed paused until X ray confirmed.

## 2018-10-07 NOTE — Progress Notes (Signed)
[place] for heparin Indication: atrial fibrillation / DVT  Allergies  Allergen Reactions  . Ace Inhibitors Cough    Patient Measurements: TBW 60.8 kg Heparin Dosing Weight: 58 kg  Vital Signs: Temp: 101.1 F (38.4 C) (01/09 1445) Temp Source: Esophageal (01/09 1100) BP: 160/36 (01/09 1513) Pulse Rate: 101 (01/09 1513)  Labs: Recent Labs    10/05/18 0338 10/06/18 0350 10/06/18 2124 10/07/18 0355 10/07/18 1421  HGB 8.7* 8.2*  --  7.4*  --   HCT 29.1* 26.5*  --  24.4*  --   PLT 297 316  --  301  --   APTT 75* 51*  --  51*  --   HEPARINUNFRC 0.53 0.34  --  0.24* 0.46  CREATININE 5.05* 2.95* 3.63* 3.86*  --     Estimated Creatinine Clearance: 9.5 mL/min (A) (by C-G formula based on SCr of 3.86 mg/dL (H)).   Medical History: Past Medical History:  Diagnosis Date  . Anemia   . Arthritis   . Asthma   . Atrial fibrillation ([place])   . Atrial flutter ([place])    a. s/p TEE/DCCV December 2013 b. 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 Bone spur of toe    Right 5th toe  . Chronic diastolic CHF (congestive heart failure) ([facility])    a. EF 50-55% by echo in 2015  . Colon polyposis   . COPD (chronic obstructive pulmonary disease) ([place])   . DVT of upper extremity (deep vein thrombosis) ([place])    Right basilic vein, June 7425  . ESRD (end stage renal disease) on dialysis ([place])    "TTF; [place]; off [employer]." (09/07/2018)  . Essential hypertension, benign   . Fractures   . Glaucoma   . Lupus ([place])   . Pericarditis   . SLE (systemic lupus erythematosus) ([place])   . Type 2 diabetes mellitus ([place])   . UTI (lower urinary tract infection)   . Wound of right leg 06/2017    Assessment: 73 yo F presents with wound infection. On Eliquis PTA for DVT and Afib- last dose  was 1/4 at 1000.   Eliquis held due to suspicion for GIB.  Pharmacy asked to restart anticoagulation with IV heparin.  Heparin level at goal this afternoon after rate increase. No overt bleeding or complications noted.  Goal of Therapy:  Heparin level 0.3-0.7 Monitor platelets by anticoagulation protocol: Yes   Plan:  Continue heparin gtt at 650 units/hr Check confirmatory 8 hr heparin level  Monitor daily heparin level, CBC, s/s of bleed  Jackson Latino, PharmD PGY1 Pharmacy Resident Phone [phone] 10/07/2018     3:19 PM

## 2018-10-07 NOTE — Progress Notes (Signed)
[place] for heparin Indication: atrial fibrillation / DVT  Allergies  Allergen Reactions  . Ace Inhibitors Cough    Patient Measurements: TBW 60.8 kg Heparin Dosing Weight: 58 kg  Vital Signs: Temp: 101 [place] (38.3 C) (01/09 2300) Temp Source: Axillary (01/09 2300) BP: 133/36 (01/09 2200) Pulse Rate: 98 (01/09 2200)  Labs: Recent Labs    10/05/18 0338 10/06/18 0350 10/06/18 2124 10/07/18 0355 10/07/18 1421 10/07/18 2245  HGB 8.7* 8.2*  --  7.4*  --   --   HCT 29.1* 26.5*  --  24.4*  --   --   PLT 297 316  --  301  --   --   APTT [place]* 51*  --  51*  --   --   HEPARINUNFRC 0.53 0.34  --  0.24* 0.46 0.26*  CREATININE 5.05* 2.95* 3.63* 3.86*  --   --     Estimated Creatinine Clearance: 9.5 mL/min (A) (by C-G formula based on SCr of 3.86 mg/dL (H)).     Assessment: 73 yo Little presents with wound infection. On Eliquis PTA for DVT and Afib- last dose was 1/4 at 1000.   Eliquis held due to suspicion for GIB.  Pharmacy asked to restart anticoagulation with IV heparin.  Heparin level 0.26 units/ml  Goal of Therapy:  Heparin level 0.3-0.7 Monitor platelets by anticoagulation protocol: Yes   Plan:  Increase heparin gtt to 750 units/hr Check 8 hr heparin level  Monitor daily heparin level, CBC, s/s of bleed  Thanks for allowing pharmacy to be a part of this patient's care.  Excell Seltzer, PharmD Clinical Pharmacist

## 2018-10-08 LAB — CBC
HCT: 26.6 % — ABNORMAL LOW (ref 36.0–46.0)
Hemoglobin: 7.5 g/dL — ABNORMAL LOW (ref 12.0–15.0)
MCH: 26.4 pg (ref 26.0–34.0)
MCHC: 28.2 g/dL — ABNORMAL LOW (ref 30.0–36.0)
MCV: 93.7 fL (ref 80.0–100.0)
Platelets: DECREASED 10*3/uL (ref 150–400)
RBC: 2.84 MIL/uL — ABNORMAL LOW (ref 3.87–5.11)
RDW: 20.2 % — ABNORMAL HIGH (ref 11.5–15.5)
WBC: 28.3 10*3/uL — ABNORMAL HIGH (ref 4.0–10.5)
nRBC: 0.6 % — ABNORMAL HIGH (ref 0.0–0.2)

## 2018-10-08 LAB — COMPREHENSIVE METABOLIC PANEL
ALT: 74 U/L — ABNORMAL HIGH (ref 0–44)
AST: 103 U/L — ABNORMAL HIGH (ref 15–41)
Albumin: 1.4 g/dL — ABNORMAL LOW (ref 3.5–5.0)
Alkaline Phosphatase: 94 U/L (ref 38–126)
Anion gap: 13 (ref 5–15)
BUN: 47 mg/dL — ABNORMAL HIGH (ref 8–23)
CO2: 20 mmol/L — ABNORMAL LOW (ref 22–32)
Calcium: 7.9 mg/dL — ABNORMAL LOW (ref 8.9–10.3)
Chloride: 102 mmol/L (ref 98–111)
Creatinine, Ser: 2.78 mg/dL — ABNORMAL HIGH (ref 0.44–1.00)
GFR calc Af Amer: 19 mL/min — ABNORMAL LOW (ref >60)
GFR calc non Af Amer: 16 mL/min — ABNORMAL LOW (ref >60)
Glucose, Bld: 147 mg/dL — ABNORMAL HIGH (ref 70–99)
Potassium: 3 mmol/L — ABNORMAL LOW (ref 3.5–5.1)
Sodium: 135 mmol/L (ref 135–145)
Total Bilirubin: 0.6 mg/dL (ref 0.3–1.2)
Total Protein: 5.7 g/dL — ABNORMAL LOW (ref 6.5–8.1)

## 2018-10-08 LAB — GLUCOSE, CAPILLARY
GLUCOSE-CAPILLARY: 140 mg/dL — AB (ref 70–99)
Glucose-Capillary: 148 mg/dL — ABNORMAL HIGH (ref 70–99)
Glucose-Capillary: 132 mg/dL — ABNORMAL HIGH (ref 70–99)
Glucose-Capillary: 163 mg/dL — ABNORMAL HIGH (ref 70–99)
Glucose-Capillary: 186 mg/dL — ABNORMAL HIGH (ref 70–99)
Glucose-Capillary: 216 mg/dL — ABNORMAL HIGH (ref 70–99)
Glucose-Capillary: 197 mg/dL — ABNORMAL HIGH (ref 70–99)

## 2018-10-08 LAB — BASIC METABOLIC PANEL
Anion gap: 11 (ref 5–15)
BUN: 60 mg/dL — ABNORMAL HIGH (ref 8–23)
CO2: 22 mmol/L (ref 22–32)
Calcium: 7.6 mg/dL — ABNORMAL LOW (ref 8.9–10.3)
Chloride: 103 mmol/L (ref 98–111)
Creatinine, Ser: 3.22 mg/dL — ABNORMAL HIGH (ref 0.44–1.00)
GFR calc non Af Amer: 14 mL/min — ABNORMAL LOW (ref >60)
GFR, EST AFRICAN AMERICAN: 16 mL/min — AB (ref >60)
Glucose, Bld: 195 mg/dL — ABNORMAL HIGH (ref 70–99)
Potassium: 3.6 mmol/L (ref 3.5–5.1)
Sodium: 136 mmol/L (ref 135–145)

## 2018-10-08 LAB — MAGNESIUM: MAGNESIUM: 1.9 mg/dL (ref 1.7–2.4)

## 2018-10-08 LAB — APTT: aPTT: 58 seconds — ABNORMAL HIGH (ref 24–36)

## 2018-10-08 LAB — PHOSPHORUS
Phosphorus: 1.3 mg/dL — ABNORMAL LOW (ref 2.5–4.6)
Phosphorus: 5 mg/dL — ABNORMAL HIGH (ref 2.5–4.6)

## 2018-10-08 LAB — HEPARIN LEVEL (UNFRACTIONATED)
Heparin Unfractionated: 0.33 IU/mL (ref 0.30–0.70)
Heparin Unfractionated: 0.37 IU/mL (ref 0.30–0.70)

## 2018-10-08 MED ORDER — PRO-STAT SUGAR FREE PO LIQD
30.0000 mL | Freq: Every day | ORAL | Status: DC
  Administered 2018-10-09 – 2018-10-10 (×2): 30 mL
  Filled 2018-10-08 (×2): qty 30

## 2018-10-08 MED ORDER — CHLORHEXIDINE GLUCONATE CLOTH 2 % EX PADS
6.0000 | MEDICATED_PAD | Freq: Every day | CUTANEOUS | Status: DC
  Administered 2018-10-09: 6 via TOPICAL

## 2018-10-08 MED ORDER — POTASSIUM CHLORIDE 20 MEQ/15ML (10%) PO SOLN
30.0000 meq | ORAL | Status: DC
  Administered 2018-10-08: 30 meq
  Filled 2018-10-08: qty 30

## 2018-10-08 MED ORDER — VITAL AF 1.2 CAL PO LIQD
1000.0000 mL | ORAL | Status: DC
  Administered 2018-10-09: 1000 mL

## 2018-10-08 MED ORDER — SODIUM PHOSPHATES 45 MMOLE/15ML IV SOLN
30.0000 mmol | Freq: Once | INTRAVENOUS | Status: AC
  Administered 2018-10-08: 30 mmol via INTRAVENOUS
  Filled 2018-10-08: qty 10

## 2018-10-08 NOTE — Progress Notes (Signed)
NAME:  [name], MRN:  [id number], DOB:  [birth year], LOS: 71 ADMISSION DATE:  09/24/2018, CONSULTATION DATE: 10/01/2018 REFERRING MD: Dr. Sarajane Jews, CHIEF COMPLAINT: Acute respiratory failure  Brief History   73 year old woman with end-stage renal disease, peripheral vascular disease, diabetes, hypertension with diastolic dysfunction.  Status post left AKA 09/26/2018 for LLE ischemia and metatarsal wound.  Poor p.o. intake, progressive weakness, A. fib since the procedure.  More more lethargy and difficulty with airway protection, now with pooling secretions and increased work of breathing. PCCM consulted for evolving resp failure.   History of present illness   73 year old chronically ill woman with end-stage renal disease on hemodialysis, peripheral vascular disease, COPD, diabetes, hypertension with associated diastolic dysfunction and CHF, SLE on chronic prednisone and Plaquenil, atrial fibrillation on amiodarone and Eliquis.  She has a history of a right AKA in 05/2018, more recently a left transmetatarsal amputation 09/06/2018.  She was admitted on 12/27 with gangrene of that foot.  She was treated with vancomycin and ceftriaxone, underwent left AKA on 12/29.  Subsequent course complicated by poor p.o. intake in the setting malnutrition, oral thrush and right flank pain.  Significant difficulty with secretion clearance, evolving poor cough and increased work of breathing.  Was felt that she was experiencing an overall decline and palliative care medicine was consulted.  On 1/3 she was progressively weak, unable to protect airway adequately or clear secretions at all.  Developing some degree of encephalopathy and showing evidence for respiratory failure. Patient's stated wishes are to undergo all potential life-saving therapies, confirmed to me by her husband at bedside today.  We performed NT suctioning and obtained copious thick green secretions were posterior pharynx. Plan intubation for  airway protection and MV.   Past Medical History   has a past medical history of Anemia, Arthritis, Asthma, Atrial fibrillation ([place]), Atrial flutter ([place]), Bone spur of toe, Chronic diastolic CHF (congestive heart failure) ([place]), Colon polyposis, COPD (chronic obstructive pulmonary disease) ([place]), DVT of upper extremity (deep vein thrombosis) ([place]), ESRD (end stage renal disease) on dialysis [facility]), Essential hypertension, benign, Fractures, Glaucoma, Lupus ([place]), Pericarditis, SLE (systemic lupus erythematosus) ([place]), Type 2 diabetes mellitus ([place]), UTI (lower urinary tract infection), and Wound of right leg (06/2017).   Significant Hospital Events   12/27 admission with left lower extremity gangrenous wound 12/29 left AKA 1/3 transfer to ICU and intubated 1/4 OGT with bright red blood  Consults:  Vascular surgery Nephrology  Palliative care medicine Infectious disease   Procedures:  LUE fistula (pre-admit) R [place] tunneled HD catheter (pre-admit) Left AKA 12/29  Significant Diagnostic Tests:  Head CT 12/27 >> stable small left parafalcine meningioma, chronic small vessel disease, nothing acute Right hip/pelvis x-ray 12/28 >> no pelvic or hip fracture, mild to moderate DJD of both hips EEG 1/2 >> global slowing without any epileptiform activity  Micro Data:  Blood cultures 12/27 >> negative Blood cultures 1/3 >> Negative Urine 1/1 >> multiple species, nondiagnostic Resp Cx 1/3>> ESBL E.coli Blood cultures 1/7 >> NGTD x2days  Antimicrobials:  Cefepime 12/27 >> 1/1; 1/3 >>  1/5 Vancomycin 12/27 >> 1/1; 1/3 >> 1/5 Fluconazole 1/2 >>   Flagyl 1/3 >>  1/5 Meropenem 1/5 >>   Interim history/subjective:  Patient is intubated in bed, no acute distress. Intermittently following commands. No family at bedside, husband was at bedside later in the day yesterday and updated about yesterdays plan. Nurse reports that patient was more awake last night, still not always following commands.  She  spent about 2 hours on pressure support yesterday, RT reports that she was very weak after that.   Objective   Blood pressure (!) 143/23, pulse (!) 103, temperature (!) 101.3 F (38.5 C), resp. rate (!) 23, height 5' (1.524 m), weight 60.1 kg, SpO2 100 %.    Vent Mode: PRVC FiO2 (%):  [40 %] 40 % Set Rate:  [18 bmp] 18 bmp Vt Set:  [360 mL] 360 mL PEEP:  [5 cmH20] 5 cmH20 Pressure Support:  [10 cmH20] 10 cmH20 Plateau Pressure:  [16 cmH20-23 cmH20] 16 cmH20   Intake/Output Summary (Last 24 hours) at 10/08/2018 8921 Last data filed at 10/08/2018 0600 Gross per 24 hour  Intake 960.05 ml  Output 420 ml  Net 540.05 ml   Filed Weights   10/06/18 0406 10/07/18 0441 10/08/18 0425  Weight: 52.7 kg 52.5 kg 60.1 kg    Physical Exam: General: Ill-appearing female,intubated, intermittently following commands, NAD HENT: [name], AT, ETT in place, OGT in place Eyes: EOMI, no scleral icterus Respiratory: Bilateral rhonchi, normal work of breathing Cardiovascular: RRR, dialysis murmur, no JVD GI: Normoactive bowel sounds, soft, grimace to palpation Extremities: old right AKA, new left AKA with staples in place, no erythema or drainage Neuro: Awake, intermittently follows commands.   CXR 10/02/18 independently reviewed by me - mild interstitial pulm edema, LLL atelectasis  Resolved Hospital Problem list     Assessment & Plan:   Acute multifactorial encephalopathy, chiefly metabolic2/2 sepsis Right forearm twitching noted earlier in hospitalization, EEG reassuring Depression ? Seizures  -Patient was intermittently following commands, some more alertness today. Leukocytosis mildly improved today to 28 however still having fevers. Minimal improvement in mental status, more alert but still only intermittently following commands.   -CT head was unremarkable..  EEG showed PLEDs, unclear etiology however may be related to infection.  -Continuing to hold home carbamazepine. Hold Zoloft. This  medication was started as an inpatient -Repeat CT head showed stable findings, no acute findings, atrophy with chronic small vessel ischemia -EEG showed periodic lateralized epileptiform discharges. Appears that this can be related to infection, CJD, anoxia, tumor, or hemorrhages.  -Clinically monitor  Septic shock secondary to ESBL E. coli pneumonia   Acute respiratory failure COPD Sputum culture grew out ESBL. Will continue treatment for E Coli pneumonia, trend WBCs and fever curve. Leukocytosis improved, still having fevers. Found to have c diff, treating with vancomycin.  Spent 2 hours on pressure support yesterday, RT noted that she was very tired after that. Given the limited improvement in her mental status and prolonged course of intubation she may require a tracheotomy at some point, she appears very frail and weak and does not appear to have a great prognosis for extubation.  -Continue mechanical ventilation -Continue meropenem x8 days (Day 5/8) -Repeat blood cultures  >>> NGTD x3 day -Bronchodilators  -SBT daily -C. Diff toxigenic PCR  >>> positive for antigen -Continue oral vancomycin 123m QID  -Chest physiotherapy, vibrate bed  Transaminitis: -Patient had an elevation in her AST and ALT up to 174 and 173 respectively.  Increased from her labs on 12/27. Alk Phos NL. Hepatitis B in 07/2018 was negative. -Patient may have developed a shock liver secondary to septic shock.   UGIB in setting of anticoagulation likely secondary to NG tube trauma Chronic right-sided abdominal pain No BRB out of OG tube today, Hgb has been stable.  -Continue heparin drip for anticoagulation for A. Fib, need to transition back to Eliquis -Trend CBC  -Continue IV  PPI BID   Chronic anemia Likely related to ESRD.  Hemoglobin has been stable -Weekly aranesp -Trend CBC as above  End-stage renal disease, HD TTS Hypokalemia, mild -HD per nephrology recommendations. Last dialysis was 1/9.  -K today  was 3, received HD yesterday -Checking magnesium, replacing phosphorus  Hypoalbuminemia: Albumin today was 1.  -Continue tube feeds -Continue pro-stat  Recent LLE AKA for ischemia/gangrene  Prior RLE AKA Peripheral vascular disease S/p LLE AKA on 12/29 -Continue wound consult  Oral candidiasis -Fluconazole T-Th-S via HD  Hypertension Chronic diastolic CHF Paroxysmal atrial fibrillation -Continue Amiodarone -Continue heparin drip while inpatient -Hold apixaban   Diabetes: Stopping steroids today.  --Sliding scale insulin  --CBGs have been elevated --Continue Lantus 5 units  --Continue novolog 3 units q4 with SSI  SLE Outpatient regimen prednisone and Plaquenil --Hold Plaquenil given concern for possible sepsis --Restart home prednisone  3 cm right upper pole renal mass of unclear significance Not clear that any biopsy has been performed.  Question whether this may be contributing to current failure to thrive, presentation.  Consider repeat CT abdomen when stable to do so if we believe this will help with prognostication and overall goals for care.   Best practice:  Diet: Tube feeds Pain/Anxiety/Delirium protocol (if indicated): intermittent versed, fentanyl  VAP protocol (if indicated): yes DVT prophylaxis: Heparin GI prophylaxis: PPI Glucose control: SSI Mobility: bedrest Code Status: Full  Family Communication:  Disposition: Remain in ICU    Labs   CBC: Recent Labs  Lab 10/03/18 1118 10/04/18 0159 10/05/18 0338 10/06/18 0350 10/07/18 0355  WBC 23.9* 31.4* 40.2* 31.6* 33.8*  HGB 8.5* 8.8* 8.7* 8.2* 7.4*  HCT 28.3* 28.5* 29.1* 26.5* 24.4*  MCV 87.9 86.6 85.6 86.9 87.8  PLT 283 331 297 316 016    Basic Metabolic Panel: Recent Labs  Lab 10/04/18 0159 10/04/18 1639 10/05/18 0338 10/05/18 1621 10/06/18 0350 10/06/18 2124 10/07/18 0355  NA 135  --  136  --  135 135 136  K 4.4  --  4.3  --  3.1* 3.6 3.5  CL 99  --  100  --  100 101 101  CO2 19*   --  21*  --  '24 22 22  ' GLUCOSE 170*  --  220*  --  391* 382* 277*  BUN 49*  --  73*  --  42* 70* 79*  CREATININE 4.13*  --  5.05*  --  2.95* 3.63* 3.86*  CALCIUM 8.3*  --  8.6*  --  8.0* 8.3* 8.3*  MG  --  1.9 2.1 1.9 1.8  --   --   PHOS 4.4 5.3* 5.7* 2.0* 2.1* 2.5 2.6   GFR: Estimated Creatinine Clearance: 10.7 mL/min (A) (by C-G formula based on SCr of 3.86 mg/dL (H)). Recent Labs  Lab 10/01/18 1409  10/03/18 1247 10/03/18 1527 10/04/18 0159 10/05/18 0338 10/06/18 0350 10/07/18 0355  WBC  --    < >  --   --  31.4* 40.2* 31.6* 33.8*  LATICACIDVEN 1.7  --  1.2 1.3  --   --   --   --    < > = values in this interval not displayed.    Liver Function Tests: Recent Labs  Lab 10/03/18 0204 10/04/18 0159 10/05/18 0338 10/06/18 0350 10/07/18 0355  AST  --   --   --  174* 52*  ALT  --   --   --  175* 91*  ALKPHOS  --   --   --  107 93  BILITOT  --   --   --  0.6 0.6  PROT  --   --   --  6.1* 5.7*  ALBUMIN 1.5* 1.5* 1.4* 1.3* 1.2*  1.2*   Recent Labs  Lab 10/02/18 0333  LIPASE 19   No results for input(s): AMMONIA in the last 168 hours.  ABG    Component Value Date/Time   PHART 7.367 10/01/2018 1838   PCO2ART 48.0 10/01/2018 1838   PO2ART 158.0 (H) 10/01/2018 1838   HCO3 27.5 10/01/2018 1838   TCO2 29 10/01/2018 1838   ACIDBASEDEF 5.4 (H) 11/03/2017 1025   O2SAT 99.0 10/01/2018 1838     Coagulation Profile: Recent Labs  Lab 10/01/18 1409  INR 1.45    CBG: Recent Labs  Lab 10/07/18 0439 10/07/18 0812 10/07/18 1207 10/07/18 1602 10/08/18 0428  GLUCAP 293* 233* 227* 222* 132*     Asencion Noble, M.D. PGY1 Pager [phone] 10/08/2018 6:37 AM

## 2018-10-08 NOTE — Progress Notes (Signed)
Nutrition Follow-up  DOCUMENTATION CODES:   Not applicable  INTERVENTION:    Vital AF 1.2 at goal rate of 50 ml/hr with Prostat 30 ml daily  Provides 1540 kcals, 105 gm protein, 973 ml free water daily   NUTRITION DIAGNOSIS:   Inadequate oral intake related to inability to eat as evidenced by NPO status, ongoing  GOAL:   Patient will meet greater than or equal to 90% of their needs, met  MONITOR:   TF tolerance, Vent status, Labs, Skin, Weight trends, I & O's  ASSESSMENT:    73 y.o. Female with PMH significant of HTN, DM, COPD, anemia, atrial fib and DVT on Eliquis, CHF, ESRD-HD (TTS), lupus, PAD, s/p R AKA. She presented on 12/27 with L foot pain. Patient reported having L transmetatarsal amputation on 09/06/18. There was concern for gangrene in the foot. She underwent L AKA on 12/29. Subsequent course complicated by poor p.o. intake in the setting malnutrition, oral thrush and right flank pain.   1/3 transfer to ICU and intubated 1/4 OGT with bright red blood  Patient is currently intubated on ventilator support MV: 8.8 L/min Temp (24hrs), Avg:101 F (38.3 C), Min:99.7 F (37.6 C), Max:101.7 F (38.7 C)  Vital AF 1.2 formula currently infusing at 40 ml/hr via OGT. Spoke with [place], RN; pt tolerating her TF well.  CCM note reviewed; spent about 2 hrs on pressure support yesterday. ESRD on HD; Nephrology following; last HD treatment 1/9. Noted pt may need tracheostomy placement.  Medications reviewed; Lantus started 1/8. Labs reviewed. K 3.0 (L). BUN 47 (H). CBG's [phone].  Diet Order:   Diet Order            Diet NPO time specified  Diet effective now             EDUCATION NEEDS:   Not appropriate for education at this time  Skin:  Skin Assessment: Skin Integrity Issues: DTI: left thigh (1/6), left buttocks (1/6) Stage II: ischial tuberosity (1/6) Stage III: sacrum (12/30) Incisions: L leg for AKA (12/29)  Last BM:  1/10   Intake/Output  Summary (Last 24 hours) at 10/08/2018 1218 Last data filed at 10/08/2018 1200 Gross per 24 hour  Intake 1523.31 ml  Output 500 ml  Net 1023.31 ml   Height:   Ht Readings from Last 1 Encounters:  09/25/18 5' (1.524 m)   Weight:   Wt Readings from Last 1 Encounters:  10/08/18 60.1 kg   Ideal Body Weight:  45.45 kg  BMI:  Body mass index is 25.88 kg/m.  Estimated Nutritional Needs:   Kcal:  7943  Protein:  95-110 gm  Fluid:  per MD  Arthur Holms, RD, LDN Pager #: [phone] After-Hours Pager #: [phone]

## 2018-10-08 NOTE — Progress Notes (Signed)
Physical Therapy Treatment Patient Details Name: [name] MRN: [id number] DOB: 1946/02/06 Today's Date: 10/08/2018    History of Present Illness [name] is a 73 y.o. female with medical history significant of hypertension, hyperlipidemia, diabetes mellitus, COPD, GERD, gout, and anemia, atrial fibrillation and DVT on Eliquis, ESRD-HD (TTS), lupus, PAD, R AKA (05/2018), now s/p L AKA. Pt with AMS and lethargy 10/01/18 transferred to ICU and intubated.     PT Comments     Pt seen on vent, following simple commands this session, but per RN, this waxes and wanes. Pt husband present and states, "I thought she would get stronger." Palliative meeting scheduled for tomorrow, 1/11. Session focused on bilateral upper extremity AAROM, gentle cervical stretching and edge of bed seated balance. Pt continuing to require two person total assistance for all bed mobility. Will follow and coordinate therapy with goals of care.   Follow Up Recommendations  SNF;LTACH     Equipment Recommendations  Other (comment)(defer)    Recommendations for Other Services       Precautions / Restrictions Precautions Precautions: Fall Precaution Comments: intubated, flexiseal Restrictions Weight Bearing Restrictions: No Other Position/Activity Restrictions: pt s/p B AKA    Mobility  Bed Mobility Overal bed mobility: Needs Assistance Bed Mobility: Rolling;Supine to Sit;Sit to Supine Rolling: +2 for physical assistance;Total assist   Supine to sit: +2 for physical assistance;Total assist Sit to supine: +2 for physical assistance;Total assist   General bed mobility comments: pt without attempt to assist  Transfers                 General transfer comment: deferred  Ambulation/Gait                 Stairs             Wheelchair Mobility    Modified Rankin (Stroke Patients Only)       Balance Overall balance assessment: Needs assistance Sitting-balance  support: Bilateral upper extremity supported Sitting balance-Leahy Scale: Zero Sitting balance - Comments: posterior lean                                    Cognition Arousal/Alertness: Awake/alert Behavior During Therapy: Flat affect Overall Cognitive Status: Difficult to assess                                 General Comments: pt nodding to yes/no questions and following some simple commands      Exercises General Exercises - Upper Extremity Shoulder Flexion: AAROM;5 reps;Both Elbow Flexion: 5 reps;AAROM;Both Digit Composite Flexion: Both;AAROM;10 reps Composite Extension: Both;10 reps;AAROM Other Exercises Other Exercises: x10 minutes seated balance on edge of bed with lateral leans onto both elbows x 5 Other Exercises: Gentle cervical retraction and left lateral flexion stretch in seated position    General Comments        Pertinent Vitals/Pain Pain Assessment: Faces Faces Pain Scale: Hurts even more Pain Location: left residual limb with movement Pain Descriptors / Indicators: Grimacing Pain Intervention(s): Monitored during session    Home Living                      Prior Function            PT Goals (current goals can now be found in the care plan section) Acute Rehab PT Goals  Patient Stated Goal: pt unable to state, but agreeable to therapy  PT Goal Formulation: Patient unable to participate in goal setting Time For Goal Achievement: 10/20/18 Potential to Achieve Goals: Fair Progress towards PT goals: Not progressing toward goals - comment    Frequency    Min 2X/week      PT Plan Current plan remains appropriate    Co-evaluation              AM-PAC PT "6 Clicks" Mobility   Outcome Measure  Help needed turning from your back to your side while in a flat bed without using bedrails?: Total Help needed moving from lying on your back to sitting on the side of a flat bed without using bedrails?:  Total Help needed moving to and from a bed to a chair (including a wheelchair)?: Total Help needed standing up from a chair using your arms (e.g., wheelchair or bedside chair)?: Total Help needed to walk in hospital room?: Total Help needed climbing 3-5 steps with a railing? : Total 6 Click Score: 6    End of Session   Activity Tolerance: Patient tolerated treatment well Patient left: in bed;with call bell/phone within reach;with bed alarm set;with family/visitor present Nurse Communication: Mobility status PT Visit Diagnosis: Other abnormalities of gait and mobility (R26.89);Muscle weakness (generalized) (M62.81);Difficulty in walking, not elsewhere classified (R26.2);Pain Pain - Right/Left: Left Pain - part of body: Leg     Time: 1333-1400 PT Time Calculation (min) (ACUTE ONLY): 27 min  Charges:  $Therapeutic Exercise: 8-22 mins $Neuromuscular Re-education: 8-22 mins                     Ellamae Sia, PT, DPT Acute Rehabilitation Services Pager [phone] Office [phone]    Willy Eddy 10/08/2018, 2:53 PM

## 2018-10-08 NOTE — Progress Notes (Signed)
[place] for heparin Indication: atrial fibrillation / DVT  Allergies  Allergen Reactions  . Ace Inhibitors Cough    Patient Measurements: TBW 60.8 kg Heparin Dosing Weight: 58 kg  Vital Signs: Temp: 100.4 F (38 C) (01/10 0837) Temp Source: Axillary (01/09 2300) BP: 141/23 (01/10 0837) Pulse Rate: 103 (01/10 0837)  Labs: Recent Labs    10/06/18 0350 10/06/18 2124 10/07/18 0355 10/07/18 1421 10/07/18 2245 10/08/18 0559 10/08/18 0929  HGB 8.2*  --  7.4*  --   --  7.5*  --   HCT 26.5*  --  24.4*  --   --  26.6*  --   PLT 316  --  301  --   --  PLATELET CLUMPS NOTED ON SMEAR, COUNT APPEARS DECREASED  --   APTT 51*  --  51*  --   --  58*  --   HEPARINUNFRC 0.34  --  0.24* 0.46 0.26*  --  0.33  CREATININE 2.95* 3.63* 3.86*  --   --  2.78*  --     Estimated Creatinine Clearance: 14.8 mL/min (A) (by C-G formula based on SCr of 2.78 mg/dL (H)).     Assessment: 73 yo F presents with wound infection. On Eliquis PTA for DVT and Afib- last dose was 1/4 at 1000.   Eliquis held due to suspicion for GIB.  Pharmacy asked to restart anticoagulation with IV heparin.  Heparin level, drawn about 1.5 hrs late, is therapeutic (0.33) after rate increase. CBC is stable. No bleeding or infusion issues per RN.  Goal of Therapy:  Heparin level 0.3-0.7 Monitor platelets by anticoagulation protocol: Yes   Plan:  Continue heparin gtt at 750 units/hr Check 8 hr heparin level  Monitor daily heparin level, CBC, s/s of bleed  Thanks for allowing pharmacy to be a part of this patient's care.  Jackson Latino, PharmD PGY1 Pharmacy Resident Phone [phone] 10/08/2018     10:13 AM

## 2018-10-08 NOTE — Progress Notes (Signed)
[name] Progress Note   Subjective:   Remains intubated   Objective Vitals:   10/08/18 0803 10/08/18 0837 10/08/18 0900 10/08/18 1000  BP:  (!) 141/23 (!) 129/24 (!) 143/47  Pulse:  (!) 103 (!) 102 (!) 106  Resp:  (!) 28 (!) 23 (!) 23  Temp:  (!) 100.4 F (38 C) (!) 100.8 F (38.2 C) (!) 100.8 F (38.2 C)  TempSrc:      SpO2: 100% 100% 100% 100%  Weight:      Height:       Physical Exam  General: on vent, responds to voice and nods head in response to quetsion Heart: RRR 2/6 SEM  Lungs: clear bilat  Abdomen: soft NT/ND Extremities: bilat amp. NO stump edema Dialysis Access: R chest [facility]  Dialysis Orders:  [facility] TTS  3.5h  61kg   400/800  Hep 1800  R TDC Mircera 225 q2weeks (last 09/14/18)   Assessment:  1. Gangrene sp L AKA 12/29 2. ESBL EColi PNA treated 3. Septic shock resolved 4. AMS some improving 5. CDif infection po vanc 6. ESRDon HD 7. VDRF may need trach 8. Anemia / ckd +ABLA - Aranesp 200 q Saturday 9. MBD ckd -No VDRA - On phoslo 667 TID. P stable 10. Volume sig below EDW here 11. Nutrition/PCM. Very low albumin. Poor PO intake  supplements/vit. 12. SLE - plaquenil/solucortef  13. DM per primarySSI 14. Access - per VVS needs upper left arm AVG when more stable 15. Afib- amiodarone/resumed apixaban.   Plan: 1. HD tomorrow in ICU, 2 L UF  Kelly Splinter MD [employer] pgr [phone]   10/08/2018, 11:39 AM      Additional Objective Labs: Basic Metabolic Panel: Recent Labs  Lab 10/06/18 2124 10/07/18 0355 10/08/18 0559  NA 135 136 135  K 3.6 3.5 3.0*  CL 101 101 102  CO2 22 22 20*  GLUCOSE 382* 277* 147*  BUN 70* 79* 47*  CREATININE 3.63* 3.86* 2.78*  CALCIUM 8.3* 8.3* 7.9*  PHOS 2.5 2.6 1.3*   CBC: Recent Labs  Lab 10/04/18 0159 10/05/18 0338 10/06/18 0350 10/07/18 0355 10/08/18 0559  WBC 31.4* 40.2* 31.6* 33.8* 28.3*  HGB 8.8* 8.7* 8.2* 7.4* 7.5*  HCT 28.5* 29.1* 26.5* 24.4*  26.6*  MCV 86.6 85.6 86.9 87.8 93.7  PLT 331 297 316 301 PLATELET CLUMPS NOTED ON SMEAR, COUNT APPEARS DECREASED   Blood Culture    Component Value Date/Time   SDES BLOOD RIGHT ARM 10/05/2018 1621   SPECREQUEST  10/05/2018 1621    BOTTLES DRAWN AEROBIC ONLY Blood Culture adequate volume   CULT  10/05/2018 1621    NO GROWTH 3 DAYS Performed at [facility], [place] [address], [place], [place] [address]    REPTSTATUS PENDING 10/05/2018 1621    Cardiac Enzymes: No results for input(s): CKTOTAL, CKMB, CKMBINDEX, TROPONINI in the last 168 hours. CBG: Recent Labs  Lab 10/07/18 1956 10/07/18 2153 10/08/18 0428 10/08/18 0802 10/08/18 1134  GLUCAP 148* 140* 132* 163* 186*   Iron Studies: No results for input(s): IRON, TIBC, TRANSFERRIN, FERRITIN in the last 72 hours. Lab Results  Component Value Date   INR 1.45 10/01/2018   INR 1.17 09/26/2018   INR 1.18 06/08/2018   Medications: . sodium chloride 10 mL/hr at 10/08/18 1000  . sodium chloride    . sodium chloride    . feeding supplement (VITAL AF 1.2 CAL) 40 mL/hr at 10/08/18 0600  . fentaNYL infusion INTRAVENOUS Stopped (  10/03/18 0807)  . heparin 750 Units/hr (10/08/18 1000)  . meropenem (MERREM) IV Stopped (10/07/18 1611)  . norepinephrine (LEVOPHED) Adult infusion    . sodium phosphate  Dextrose 5% IVPB 30 mmol (10/08/18 1011)   . amiodarone  200 mg Per Tube Daily  . brimonidine  1 drop Left Eye BID   And  . timolol  1 drop Left Eye BID  . chlorhexidine gluconate (MEDLINE KIT)  15 mL Mouth Rinse BID  . Chlorhexidine Gluconate Cloth  6 each Topical Q0600  . darbepoetin (ARANESP) injection - DIALYSIS  200 mcg Intravenous Q Sat-HD  . feeding supplement (PRO-STAT SUGAR FREE 64)  30 mL Oral BID  . heparin  1,800 Units Dialysis Once in dialysis  . insulin aspart  0-9 Units Subcutaneous Q4H  . insulin aspart  3 Units Subcutaneous Q4H  . insulin glargine  5 Units Subcutaneous Daily  . ipratropium-albuterol  3 mL  Nebulization BID  . latanoprost  1 drop Both Eyes QHS  . mouth rinse  15 mL Mouth Rinse 10 times per day  . pantoprazole (PROTONIX) IV  40 mg Intravenous Q12H  . predniSONE  5 mg Oral Q breakfast  . sodium chloride flush  3 mL Intravenous Q12H  . vancomycin  125 mg Per Tube QID

## 2018-10-08 NOTE — Progress Notes (Signed)
[place] for heparin Indication: atrial fibrillation / DVT  Allergies  Allergen Reactions  . Ace Inhibitors Cough    Patient Measurements: TBW 60.8 kg Heparin Dosing Weight: 58 kg  Vital Signs: Temp: 100.4 F (38 C) (01/10 1600) BP: 114/19 (01/10 1600) Pulse Rate: 90 (01/10 1600)  Labs: Recent Labs    10/06/18 0350 10/06/18 2124 10/07/18 0355  10/07/18 2245 10/08/18 0559 10/08/18 0929 10/08/18 1634  HGB 8.2*  --  7.4*  --   --  7.5*  --   --   HCT 26.5*  --  24.4*  --   --  26.6*  --   --   PLT 316  --  301  --   --  PLATELET CLUMPS NOTED ON SMEAR, COUNT APPEARS DECREASED  --   --   APTT 51*  --  51*  --   --  58*  --   --   HEPARINUNFRC 0.34  --  0.24*   < > 0.26*  --  0.33 0.37  CREATININE 2.95* 3.63* 3.86*  --   --  2.78*  --   --    < > = values in this interval not displayed.    Estimated Creatinine Clearance: 14.8 mL/min (A) (by C-G formula based on SCr of 2.78 mg/dL (H)).     Assessment: 73 yo F presents with wound infection. On Eliquis PTA for DVT and Afib- last dose was 1/4 at 1000.   Eliquis held due to suspicion for GIB.  Pharmacy asked to restart anticoagulation with IV heparin.  Heparin level is therapeutic (0.37) after rate increase. CBC is stable. No bleeding or infusion issues per RN.  Goal of Therapy:  Heparin level 0.3-0.7 Monitor platelets by anticoagulation protocol: Yes   Plan:  Continue heparin gtt at 750 units/hr Monitor daily heparin level, CBC, s/s of bleed  Thanks for allowing pharmacy to be a part of this patient's care.  Erin Hearing PharmD., BCPS Clinical Pharmacist 10/08/2018 5:39 PM

## 2018-10-08 NOTE — Progress Notes (Addendum)
Daily Progress Note   Patient Name: [name]       Date: 10/08/2018 DOB: October 04, 1945  Age: 73 y.o. MRN#: [id number] Attending Physician: Kipp Brood, MD Primary Care Physician: Sharilyn Sites, MD Admit Date: 09/24/2018  Reason for Consultation/Follow-up: Establishing goals of care and Psychosocial/spiritual support  Subjective: Patient seen, chart reviewed.  Patient has continued decline since the last time I saw her in the hospital after her amputation.  She is now in ICU, ventilated.  Per RN, the longest patient has been able to wean is 2 hours.  She has been having a fluctuating level of consciousness and and ability to be alert, follow commands is waxing and waning.  Husband is at the bedside and is very overwhelmed by her current clinical condition.  He has low healthcare literacy and when we spoke in the past, he verbalized to me the quality of life would be coming back home.  I attempted to explain to him with this level of aggressive care, ventilator, she could potentially be living the rest of her life in a facility on life support if she were unable to wean.  I also began conversation regarding   tracheostomy.  Mr. Middlebrooks asked "will she ever be able to tell me herself what she would want?" They have no children together and he is her healthcare proxy  Length of Stay: 14  Current Medications: Scheduled Meds:  . amiodarone  200 mg Per Tube Daily  . brimonidine  1 drop Left Eye BID   And  . timolol  1 drop Left Eye BID  . chlorhexidine gluconate (MEDLINE KIT)  15 mL Mouth Rinse BID  . Chlorhexidine Gluconate Cloth  6 each Topical Q0600  . Chlorhexidine Gluconate Cloth  6 each Topical Q0600  . darbepoetin (ARANESP) injection - DIALYSIS  200 mcg Intravenous Q Sat-HD    . feeding supplement (PRO-STAT SUGAR FREE 64)  30 mL Per Tube Daily  . heparin  1,800 Units Dialysis Once in dialysis  . insulin aspart  0-9 Units Subcutaneous Q4H  . insulin aspart  3 Units Subcutaneous Q4H  . insulin glargine  5 Units Subcutaneous Daily  . ipratropium-albuterol  3 mL Nebulization BID  . latanoprost  1 drop Both Eyes QHS  . mouth rinse  15 mL Mouth Rinse 10 times per  day  . pantoprazole (PROTONIX) IV  40 mg Intravenous Q12H  . predniSONE  5 mg Oral Q breakfast  . sodium chloride flush  3 mL Intravenous Q12H  . vancomycin  125 mg Per Tube QID    Continuous Infusions: . sodium chloride 10 mL/hr at 10/08/18 1200  . sodium chloride    . sodium chloride    . feeding supplement (VITAL AF 1.2 CAL)    . fentaNYL infusion INTRAVENOUS Stopped (10/03/18 0807)  . heparin 750 Units/hr (10/08/18 1200)  . meropenem (MERREM) IV Stopped (10/07/18 1611)  . norepinephrine (LEVOPHED) Adult infusion    . sodium phosphate  Dextrose 5% IVPB 43 mL/hr at 10/08/18 1200    PRN Meds: sodium chloride, sodium chloride, sodium chloride, acetaminophen (TYLENOL) oral liquid 160 mg/5 mL, albuterol, alteplase, docusate, fentaNYL, fentaNYL (SUBLIMAZE) injection, heparin, heparin, hydrALAZINE, lidocaine (PF), lidocaine-prilocaine, metoprolol tartrate, midazolam, midazolam, ondansetron, pentafluoroprop-tetrafluoroeth, sodium chloride flush  Physical Exam Vitals signs and nursing note reviewed.  Constitutional:      Comments: Acutely ill-appearing elderly female.  Minimally responsive to voice and touch.  Intubated  HENT:     Head: Normocephalic and atraumatic.  Pulmonary:     Comments: Ventilated Skin:    General: Skin is warm and dry.  Neurological:     Comments: Unable to test Per RN, fluctuating level of consciousness and ability to follow commands  Psychiatric:     Comments: No overt agitation otherwise unable to test             Vital Signs: BP (!) 129/16   Pulse 95   Temp (!)  100.9 F (38.3 C)   Resp (!) 25   Ht 5' (1.524 m)   Wt 60.1 kg   SpO2 100%   BMI 25.88 kg/m  SpO2: SpO2: 100 % O2 Device: O2 Device: Ventilator O2 Flow Rate: O2 Flow Rate (L/min): 2 L/min  Intake/output summary:   Intake/Output Summary (Last 24 hours) at 10/08/2018 1314 Last data filed at 10/08/2018 1200 Gross per 24 hour  Intake 1516.81 ml  Output 500 ml  Net 1016.81 ml   LBM: Last BM Date: 10/08/18 Baseline Weight: Weight: 61.8 kg Most recent weight: Weight: 60.1 kg       Palliative Assessment/Data:    Flowsheet Rows     Most Recent Value  Intake Tab  Referral Department  Hospitalist  Unit at Time of Referral  Orthopedic Unit  Palliative Care Primary Diagnosis  Sepsis/Infectious Disease  Date Notified  09/30/18  Palliative Care Type  New Palliative care  Reason for referral  Clarify Goals of Care, Psychosocial or Spiritual support  Date of Admission  09/24/18  Date first seen by Palliative Care  10/01/18  # of days Palliative referral response time  1 Day(s)  # of days IP prior to Palliative referral  6  Clinical Assessment  Palliative Performance Scale Score  30%  Pain Max last 24 hours  Not able to report  Pain Min Last 24 hours  Not able to report  Dyspnea Max Last 24 Hours  Not able to report  Dyspnea Min Last 24 hours  Not able to report  Nausea Max Last 24 Hours  Not able to report  Nausea Min Last 24 Hours  Not able to report  Anxiety Max Last 24 Hours  Not able to report  Anxiety Min Last 24 Hours  Not able to report  Other Max Last 24 Hours  Not able to report  Psychosocial & Spiritual Assessment  Palliative Care Outcomes  Patient/Family meeting held?  Yes  Who was at the meeting?  husband  Palliative Care follow-up planned  Yes, Facility      Patient Active Problem List   Diagnosis Date Noted  . S/P AKA (above knee amputation) unilateral, left ([place]) 10/01/2018  . Palliative care by specialist   . Oral candidiasis 09/30/2018  . Dysphagia  09/30/2018  . FTT (failure to thrive) in adult 09/29/2018  . Acute on chronic diastolic CHF (congestive heart failure) ([place]) 09/28/2018  . S/P AKA (above knee amputation) bilateral ([place])   . Acute on chronic anemia   . Supplemental oxygen dependent   . Hypertension   . GERD (gastroesophageal reflux disease) 09/24/2018  . Gout 09/24/2018  . Left foot infection 09/24/2018  . Pressure injury of skin 09/09/2018  . S/P AKA (above knee amputation) unilateral, right ([place])   . Status post amputation of left foot through metatarsal bone ([place])   . Acute blood loss anemia   . Orthostasis   . Diabetes mellitus type 2 in nonobese (HCC)   . PAF (paroxysmal atrial fibrillation) ([place])   . PAD (peripheral artery disease) ([place]) 09/06/2018  . History of trichomonal vaginitis 08/18/2018  . Hypoglycemia 07/29/2018  . Trichomonal vaginitis 07/12/2018  . Atrial flutter ([place])   . SVT (supraventricular tachycardia) ([place])   . ESRD on dialysis ([place])   . Steroid-induced hyperglycemia   . Type II diabetes mellitus with peripheral autonomic neuropathy (HCC)   . Anemia of chronic disease   . Prediabetes   . Postoperative pain   . Ischemic leg pain   . Unilateral AKA, right ([place])   . Post-operative pain   . Diabetic retinopathy associated with type 2 diabetes mellitus ([place])   . Diabetic nephropathy associated with type 2 diabetes mellitus ([place])   . Leukocytosis   . Non-healing wound of lower extremity 06/08/2018  . Malnutrition of moderate degree 03/03/2018  . Leg wound, right 03/01/2018  . Ulcer of right calf ([place]) 02/10/2018  . Chronic RLQ pain 01/11/2018  . Ischemic ulcer of toes on both feet ([place])   . Cellulitis in diabetic foot ([place]) 12/14/2017  . Atrial fibrillation ([place]) 12/14/2017  . Glaucoma 12/14/2017  . ESRD (end stage renal disease) on dialysis ([place]) 11/16/2017  . COPD with acute exacerbation ([place]) 11/03/2017  . Community acquired pneumonia 11/02/2017  . Nausea and vomiting 11/02/2017  .  Iron deficiency anemia 08/07/2017  . Heme positive stool 10/04/2015  . Mixed hyperlipidemia 06/29/2015  . Type 2 diabetes mellitus with stage 4 chronic kidney disease, with long-term current use of insulin ([place]) 06/29/2015  . Chronic kidney disease, stage V ([place]) 04/12/2014  . 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 Goals of care, counseling/discussion 03/30/2014  . Acute respiratory failure with hypoxia ([place]) 03/15/2014  . Chronic diastolic heart failure ([place]) 03/15/2014  . Anemia of chronic renal failure, stage 4 (severe) ([place]) 02/23/2014  . Folate deficiency 02/17/2014  . Aortic regurgitation 09/19/2012  . Class 2 severe obesity due to excess calories with serious comorbidity and body mass index (BMI) of 35.0 to 35.9 in adult ([place]) 09/14/2012  . Type 2 diabetes mellitus with diabetic neuropathy ([place]) 09/14/2012  . SLE (systemic lupus erythematosus) ([place]) 09/14/2012  . Essential hypertension, benign 09/14/2012  . COPD (chronic obstructive pulmonary disease) ([place]) 09/14/2012    Palliative Care Assessment & Plan   Patient Profile: 73 y.o. female  with past medical history of COPD, end-stage renal disease on  hemodialysis diastolic heart failure, dysphasia, diabetes type 2, lupus, admitted on 09/24/2018 with with fever, cellulitis necrotic metatarsal wound at previous operative site.  She was admitted to the hospital for surgery for a left AKA which she underwent on 09/26/2018.  Since that time she has had decreased oral intake chronic right-sided abdominal pain, dysphasia, atrial fib.  Per chart review, she appears depressed and has been started on Zoloft 50 mg daily.  She has had multiple hospitalizations over the past several months.  Consult ordered for goals of care.  Despite aggressive treatment, patient has continued to decline.  She has multiple underlying comorbidities including failure to thrive, protein calorie malnutrition  with an albumin of 1.4; lupus, and unknown mass to right kidney, potential renal cell carcinoma.  She is now intubated in ICU and is not making much progress in terms of weaning   Recommendations/Plan:  Patient's husband is very overwhelmed, low healthcare literacy.  I think a multidisciplinary team to include critical care medicine, palliative medicine would be beneficial. I have paged CCM resident, Dr. Sherry Ruffing, who is working with Dr. Lynetta Mare today. Husband is available on Sat and is at the bedside now.      Goals of Care and Additional Recommendations:  Limitations on Scope of Treatment: Full Scope Treatment  Code Status:    Code Status Orders  (From admission, onward)         Start     Ordered   09/24/18 2026  Full code  Continuous     09/24/18 2026        Code Status History    Date Active Date Inactive Code Status Order ID Comments User Context   09/06/2018 1435 09/13/2018 1831 Full Code [id number]  Orbie Hurst Inpatient   07/29/2018 1641 07/30/2018 1847 Full Code [id number]  Roxan Hockey, MD ED   06/10/2018 1903 06/24/2018 1754 Full Code [id number]  Bary Leriche, PA-C Inpatient   06/08/2018 1740 06/10/2018 1901 Full Code [id number]  Gabriel Earing, PA-C Inpatient   03/01/2018 2113 03/03/2018 1914 Full Code [id number]  Oswald Hillock, MD Inpatient   12/14/2017 2051 12/29/2017 1913 Full Code [id number]  Reubin Milan, MD ED   11/02/2017 2214 11/13/2017 2147 Full Code [id number]  Heath Lark D, DO ED   03/25/2014 1305 03/28/2014 1908 Full Code [id number]  Marybelle Killings, MD Inpatient   03/20/2014 1337 03/25/2014 1305 Full Code [id number]  Arne Cleveland, MD Inpatient   03/13/2014 1314 03/20/2014 1337 Full Code [id number]  Radene Gunning, NP Inpatient       Prognosis:   Unable to determine  Discharge Planning:  To Be Determined  Care plan was discussed with Dr. Sherry Ruffing. She is going to staff with Dr. Lynetta Mare possibility of mtg 10/09/2018.  Palliative medicine then to  reach out to Mr. Gehlhausen with a time for meeting  Addendum 1400: Return call from Dr. Sherry Ruffing. Dr. Lynetta Mare to speak with pt's husband re: trach. Declined PM's involvement at this time. Please re consult if we can be of any assistance  Thank you for allowing the Palliative Medicine Team to assist in the care of this patient.   Time In: 1300 Time Out: 1328 Total Time 28 min Prolonged Time Billed  no       Greater than 50%  of this time was spent counseling and coordinating care related to the above assessment and plan.  Dory Horn, NP  Please contact Palliative Medicine Team phone at [phone] for questions and concerns.

## 2018-10-09 LAB — RENAL FUNCTION PANEL
Albumin: 1.3 g/dL — ABNORMAL LOW (ref 3.5–5.0)
Anion gap: 12 (ref 5–15)
BUN: 63 mg/dL — ABNORMAL HIGH (ref 8–23)
CALCIUM: 7.6 mg/dL — AB (ref 8.9–10.3)
CO2: 21 mmol/L — ABNORMAL LOW (ref 22–32)
Chloride: 104 mmol/L (ref 98–111)
Creatinine, Ser: 3.49 mg/dL — ABNORMAL HIGH (ref 0.44–1.00)
GFR calc Af Amer: 14 mL/min — ABNORMAL LOW (ref >60)
GFR calc non Af Amer: 12 mL/min — ABNORMAL LOW (ref >60)
Glucose, Bld: 108 mg/dL — ABNORMAL HIGH (ref 70–99)
Phosphorus: 4.9 mg/dL — ABNORMAL HIGH (ref 2.5–4.6)
Potassium: 3.4 mmol/L — ABNORMAL LOW (ref 3.5–5.1)
Sodium: 137 mmol/L (ref 135–145)

## 2018-10-09 LAB — CBC
HEMATOCRIT: 23.7 % — AB (ref 36.0–46.0)
Hemoglobin: 7 g/dL — ABNORMAL LOW (ref 12.0–15.0)
MCH: 27.1 pg (ref 26.0–34.0)
MCHC: 29.5 g/dL — ABNORMAL LOW (ref 30.0–36.0)
MCV: 91.9 fL (ref 80.0–100.0)
Platelets: 245 10*3/uL (ref 150–400)
RBC: 2.58 MIL/uL — ABNORMAL LOW (ref 3.87–5.11)
RDW: 20.3 % — ABNORMAL HIGH (ref 11.5–15.5)
WBC: 24.1 10*3/uL — ABNORMAL HIGH (ref 4.0–10.5)
nRBC: 0.3 % — ABNORMAL HIGH (ref 0.0–0.2)

## 2018-10-09 LAB — HEPARIN LEVEL (UNFRACTIONATED): Heparin Unfractionated: 0.37 IU/mL (ref 0.30–0.70)

## 2018-10-09 LAB — GLUCOSE, CAPILLARY
GLUCOSE-CAPILLARY: 197 mg/dL — AB (ref 70–99)
Glucose-Capillary: 113 mg/dL — ABNORMAL HIGH (ref 70–99)
Glucose-Capillary: 136 mg/dL — ABNORMAL HIGH (ref 70–99)
Glucose-Capillary: 147 mg/dL — ABNORMAL HIGH (ref 70–99)
Glucose-Capillary: 226 mg/dL — ABNORMAL HIGH (ref 70–99)
Glucose-Capillary: 116 mg/dL — ABNORMAL HIGH (ref 70–99)
Glucose-Capillary: 85 mg/dL (ref 70–99)

## 2018-10-09 LAB — APTT: aPTT: 72 seconds — ABNORMAL HIGH (ref 24–36)

## 2018-10-09 MED ORDER — INSULIN ASPART 100 UNIT/ML ~~LOC~~ SOLN
3.0000 [IU] | SUBCUTANEOUS | Status: DC
  Administered 2018-10-09: 3 [IU] via SUBCUTANEOUS

## 2018-10-09 MED ORDER — VITAL AF 1.2 CAL PO LIQD: 1000.0000 mL | ORAL | Status: DC

## 2018-10-09 NOTE — Plan of Care (Signed)
Patient continues to be intubated. She also continues to have diarrhea causing severe malnutrition even though getting Prostat and tube feeds. Wounds remain the same. Patient looks like she's miserable and suffering.

## 2018-10-09 NOTE — Progress Notes (Signed)
[place] for heparin Indication: atrial fibrillation / DVT  Allergies  Allergen Reactions  . Ace Inhibitors Cough    Patient Measurements: TBW 60.8 kg Heparin Dosing Weight: 58 kg  Vital Signs: Temp: 98.2 F (36.8 C) (01/11 1000) Temp Source: Oral (01/11 0800) BP: 125/52 (01/11 1000) Pulse Rate: 86 (01/11 1000)  Labs: Recent Labs    10/07/18 0355  10/08/18 0559 10/08/18 0929 10/08/18 1634 10/08/18 1935 10/09/18 0405 10/09/18 0923  HGB 7.4*  --  7.5*  --   --   --  7.0*  --   HCT 24.4*  --  26.6*  --   --   --  23.7*  --   PLT 301  --  PLATELET CLUMPS NOTED ON SMEAR, COUNT APPEARS DECREASED  --   --   --  245  --   APTT 51*  --  58*  --   --   --  72*  --   HEPARINUNFRC 0.24*   < >  --  0.33 0.37  --   --  0.37  CREATININE 3.86*  --  2.78*  --   --  3.22* 3.49*  --    < > = values in this interval not displayed.    Estimated Creatinine Clearance: 11.8 mL/min (A) (by C-G formula based on SCr of 3.49 mg/dL (H)).     Assessment: 73 yo F presents with wound infection. On Eliquis PTA for DVT and Afib- last dose was 1/4 at 1000.   Eliquis held due to suspicion for GIB.  Pharmacy asked to restart anticoagulation with IV heparin.  Heparin level is therapeutic (0.37) on heparin drip rate 750 uts/hr. CBC is stable. No bleeding or infusion issues per RN.  Goal of Therapy:  Heparin level 0.3-0.7 Monitor platelets by anticoagulation protocol: Yes   Plan:  Continue heparin gtt at 750 units/hr Monitor daily heparin level, CBC, s/s of bleed  Bonnita Nasuti Pharm.D. CPP, BCPS Clinical Pharmacist [phone] 10/09/2018 12:04 PM

## 2018-10-09 NOTE — Progress Notes (Signed)
PT placed back on full vent support due to increased RR into upper 30s even after increasing PS.  RT will continue to monitor.

## 2018-10-09 NOTE — Progress Notes (Signed)
Pharmacy Antibiotic Note  [name] is a 73 y.o. female admitted on 09/24/2018 with sepsis.  Pharmacy has been consulted for vancomycin and cefepime dosing, then narrowed to meropenem for ESBL PNA. Increased diarrhea and Cdiff + started po vancomycin  Plan: Continue Meropenem 500 mg q 24 hrs through 10/11/2018 per ID recommendations. Vancomycin 125mg  q6h end (1/18)   Height: 5' (152.4 cm) Weight: 131 lb 2.8 oz (59.5 kg) IBW/kg (Calculated) : 45.5  Temp (24hrs), Avg:99.1 F (37.3 C), Min:97.2 F (36.2 C), Max:100.8 F (38.2 C)  Recent Labs  Lab 10/03/18 1247 10/03/18 1527  10/05/18 0338 10/06/18 0350 10/06/18 2124 10/07/18 0355 10/08/18 0559 10/08/18 1935 10/09/18 0405  WBC  --   --    < > 40.2* 31.6*  --  33.8* 28.3*  --  24.1*  CREATININE  --   --    < > 5.05* 2.95* 3.63* 3.86* 2.78* 3.22* 3.49*  LATICACIDVEN 1.2 1.3  --   --   --   --   --   --   --   --    < > = values in this interval not displayed.    Estimated Creatinine Clearance: 11.8 mL/min (A) (by C-G formula based on SCr of 3.49 mg/dL (H)).    Allergies  Allergen Reactions  . Ace Inhibitors Cough   Antimicrobials this admission:  Cefepime 1/3 >> 1/5 Vancomycin 1/3 1/6 Fluconazole 1/4 > 1/5 Meropenem 1/5 > (1/13) Po vanc 1/19-(1/18)  Dose adjustments this admission:   Microbiology results:  1/8 Cdiff + 1/7 Bcx x 2: ngtd 1/3 BCx x 2: neg 1/3 Sputum: abundant Ecoli ESBL- MDR; senstv to meropenem  1/1 UCx: multiple species 12/27 BCx  - neg  Bonnita Nasuti Pharm.D. CPP, BCPS Clinical Pharmacist [phone] 10/09/2018 12:10 PM

## 2018-10-09 NOTE — Progress Notes (Signed)
[facility] Progress Note   Subjective:   Remains intubated   Objective Vitals:   10/09/18 0600 10/09/18 0757 10/09/18 0800 10/09/18 0900  BP: (!) 137/30  (!) 126/36 (!) 128/35  Pulse: 94   92  Resp: (!) 22   19  Temp: 98.8 F (37.1 C)   98.2 F (36.8 C)  TempSrc:   Oral   SpO2: 100% 100%  100%  Weight:      Height:       Physical Exam  General: on vent, responds to voice, nods head in response to question Heart: RRR 2/6 SEM  Lungs: clear bilat  Abdomen: soft NT/ND Extremities: bilat amp. NO stump edema Dialysis Access: R chest [facility]  Dialysis Orders:  [facility] TTS  3.5h  61kg   400/800  Hep 1800  R TDC Mircera 225 q2weeks (last 09/14/18)   Assessment:  1. Gangrene sp L AKA 12/29 2. FTT - pall care meeting w/ husband, may need interdisc meeting 3. ESBL EColi PNA treated 4. Septic shock resolved 5. AMS some improvement 6. CDif infection po vanc 7. ESRDon HD 8. Volume - wt's up last few days, not sure accurate  9. VDRF may need trach 10. Anemia / ckd +ABLA - Aranesp 200 q Saturday 11. MBD ckd -No VDRA - On phoslo 667 TID. P stable 12. Nutrition/PCM. Very low albumin. Poor PO intake  supplements/vit. 13. SLE - plaquenil/solucortef  14. DM per primarySSI 15. Access - failed L AVF, per VVS > LUE AVG when more stable 16. Afib- amiodarone/resumed apixaban.   Plan: 1. HD today in ICU  Kelly Splinter MD [facility] pgr [phone]   10/09/2018, 9:19 AM      Additional Objective Labs: Basic Metabolic Panel: Recent Labs  Lab 10/08/18 0559 10/08/18 1935 10/09/18 0405  NA 135 136 137  K 3.0* 3.6 3.4*  CL 102 103 104  CO2 20* 22 21*  GLUCOSE 147* 195* 108*  BUN 47* 60* 63*  CREATININE 2.78* 3.22* 3.49*  CALCIUM 7.9* 7.6* 7.6*  PHOS 1.3* 5.0* 4.9*   CBC: Recent Labs  Lab 10/05/18 0338 10/06/18 0350 10/07/18 0355 10/08/18 0559 10/09/18 0405  WBC 40.2* 31.6* 33.8* 28.3* 24.1*  HGB 8.7* 8.2* 7.4* 7.5* 7.0*  HCT  29.1* 26.5* 24.4* 26.6* 23.7*  MCV 85.6 86.9 87.8 93.7 91.9  PLT 297 316 301 PLATELET CLUMPS NOTED ON SMEAR, COUNT APPEARS DECREASED 245   Blood Culture    Component Value Date/Time   SDES BLOOD RIGHT ARM 10/05/2018 1621   SPECREQUEST  10/05/2018 1621    BOTTLES DRAWN AEROBIC ONLY Blood Culture adequate volume   CULT  10/05/2018 1621    NO GROWTH 4 DAYS Performed at [facility], [place] [address] [place], [place] [address]    REPTSTATUS PENDING 10/05/2018 1621    Cardiac Enzymes: No results for input(s): CKTOTAL, CKMB, CKMBINDEX, TROPONINI in the last 168 hours. CBG: Recent Labs  Lab 10/08/18 1134 10/08/18 1534 10/08/18 1956 10/09/18 0035 10/09/18 0425  GLUCAP 186* 216* 197* 136* 113*   Iron Studies: No results for input(s): IRON, TIBC, TRANSFERRIN, FERRITIN in the last 72 hours. Lab Results  Component Value Date   INR 1.45 10/01/2018   INR 1.17 09/26/2018   INR 1.18 06/08/2018   Medications: . sodium chloride 10 mL/hr at 10/09/18 0900  . sodium chloride    . sodium chloride    . feeding supplement (VITAL AF 1.2 CAL) 50 mL/hr at 10/09/18 0600  .  fentaNYL infusion INTRAVENOUS Stopped (10/03/18 0807)  . heparin 750 Units/hr (10/09/18 0900)  . meropenem (MERREM) IV Stopped (10/08/18 1647)  . norepinephrine (LEVOPHED) Adult infusion     . amiodarone  200 mg Per Tube Daily  . brimonidine  1 drop Left Eye BID   And  . timolol  1 drop Left Eye BID  . chlorhexidine gluconate (MEDLINE KIT)  15 mL Mouth Rinse BID  . Chlorhexidine Gluconate Cloth  6 each Topical Q0600  . Chlorhexidine Gluconate Cloth  6 each Topical Q0600  . darbepoetin (ARANESP) injection - DIALYSIS  200 mcg Intravenous Q Sat-HD  . feeding supplement (PRO-STAT SUGAR FREE 64)  30 mL Per Tube Daily  . heparin  1,800 Units Dialysis Once in dialysis  . insulin aspart  0-9 Units Subcutaneous Q4H  . insulin aspart  3 Units Subcutaneous Q4H  . insulin glargine  5 Units Subcutaneous Daily  .  ipratropium-albuterol  3 mL Nebulization BID  . latanoprost  1 drop Both Eyes QHS  . mouth rinse  15 mL Mouth Rinse 10 times per day  . pantoprazole (PROTONIX) IV  40 mg Intravenous Q12H  . predniSONE  5 mg Oral Q breakfast  . sodium chloride flush  3 mL Intravenous Q12H  . vancomycin  125 mg Per Tube QID

## 2018-10-09 NOTE — Progress Notes (Signed)
PULMONARY / CRITICAL CARE MEDICINE   NAME:  [name], MRN:  [id number], DOB:  1946-04-29, LOS: 91 ADMISSION DATE:  09/24/2018, CONSULTATION DATE:  10/01/2018 REFERRING MD:  Daymon Larsen, CHIEF COMPLAINT:  Acute respiratory failure  BRIEF HISTORY:    73 year old vasculopath status post right BKA for gangrene. Now critically ill due to respiratory failure requiring mechanical ventilation, C. Diff gastroenteritis with poor nutritional status, and renal failure on HD.  Encephalopathy and generalized weakness are principal barriers to extubation HISTORY OF PRESENT ILLNESS   73 year old chronically ill woman with end-stage renal disease on hemodialysis, peripheral vascular disease, COPD, diabetes, hypertension with associated diastolic dysfunction and CHF, SLE on chronic prednisone and Plaquenil, atrial fibrillation on amiodarone and Eliquis.  She has a history of a right AKA in 05/2018, more recently a left transmetatarsal amputation 09/06/2018.  She was admitted on 12/27 with gangrene of that foot.  She was treated with vancomycin and ceftriaxone, underwent left AKA on 12/29.  Subsequent course complicated by poor p.o. intake in the setting malnutrition, oral thrush and right flank pain.  Significant difficulty with secretion clearance, evolving poor cough and increased work of breathing.  Was felt that she was experiencing an overall decline and palliative care medicine was consulted.  On 1/3 she was progressively weak, unable to protect airway adequately or clear secretions at all.  Developing some degree of encephalopathy and showing evidence for respiratory failure.  SIGNIFICANT PAST MEDICAL HISTORY   has a past medical history of Anemia, Arthritis, Asthma, Atrial fibrillation ([place]), Atrial flutter ([place]), Bone spur of toe, Chronic diastolic CHF (congestive heart failure) ([place]), Colon polyposis, COPD (chronic obstructive pulmonary disease) ([place]), DVT of upper extremity (deep vein thrombosis) ([place]),  ESRD (end stage renal disease) on dialysis [facility]), Essential hypertension, benign, Fractures, Glaucoma, Lupus ([place]), Pericarditis, SLE (systemic lupus erythematosus) ([place]), Type 2 diabetes mellitus ([place]), UTI (lower urinary tract infection), and Wound of right leg (06/2017).  SIGNIFICANT EVENTS:  12/27 admission with left lower extremity gangrenous wound 12/29 left AKA 1/3 transfer to ICU and intubated 1/4 OGT with bright red blood STUDIES:   Head CT 12/27 >> stable small left parafalcine meningioma, chronic small vessel disease, nothing acute Right hip/pelvis x-ray 12/28 >> no pelvic or hip fracture, mild to moderate DJD of both hips EEG 1/2 >> global slowing without any epileptiform activity CULTURES:  Blood cultures 12/27 >> negative Blood cultures 1/3 >> Negative Urine 1/1 >> multiple species, nondiagnostic Resp Cx 1/3>> ESBL E.coli Blood cultures 1/7 >> NG x4days  ANTIBIOTICS:  Cefepime 12/27 >> 1/1; 1/3 >>  1/5 Vancomycin 12/27 >> 1/1; 1/3 >> 1/5 Fluconazole 1/2 >>   Flagyl 1/3 >>  1/5 Meropenem 1/5 >>  LINES/TUBES:   LUE fistula (pre-admit) R [place] tunneled HD catheter (pre-admit) Left AKA 12/29 CONSULTANTS:  Vascular surgery Nephrology  Palliative care medicine Infectious disease  SUBJECTIVE:  C/o generalized abdominal tenderness  CONSTITUTIONAL: BP (!) 125/52   Pulse 86   Temp 98.2 F (36.8 C)   Resp (!) 21   Ht 5' (1.524 m)   Wt 59.5 kg   SpO2 100%   BMI 25.62 kg/m   I/O last 3 completed shifts: In: 2773.1 [P.O.:120; I.V.:604.2; YI/FO:2774; IV Piggyback:360.9] Out: 1000 [Stool:1000]     Vent Mode: CPAP;PSV FiO2 (%):  [40 %] 40 % Set Rate:  [18 bmp] 18 bmp Vt Set:  [360 mL] 360 mL PEEP:  [5 cmH20] 5 cmH20 Pressure Support:  [10 cmH20] 10 cmH20 Plateau Pressure:  [10 JOI78-67 cmH20]  10 cmH20  PHYSICAL EXAM: General:  Ill-appearing female,intubated, intermittently following commands, NAD Neuro:  Unable to reliably assess HEENT:  Experiment/AT; PERRL,  EOMI; ETT/OGT in place Cardiovascular:  RRR Lungs:  Clear ant bilat Abdomen:  Diffuse tenderness without guarding Musculoskeletal:  old right AKA, new left AKA with staples in place, no erythema or drainage Skin:  Pressure ulcers  RESOLVED PROBLEM LIST   ASSESSMENT AND PLAN   1) Acute respiratory failure on MV  Currently not a candidate for weaning. Cont full support 2) Acute metabolic encephalopathy with delirium.  Minimize sedation to the extent possible 3) Likely C. Diff entercolitis  Would transiently decrease TF rate to 20 to see if stools decrease. Continue po  vanc 4) Acute on chronic renal failure on HD  To have HD today 5) Hemodynamics  Amiodarone  Apresoline prn 6) Anemia  H&H slowly declining  Follow for now  SUMMARY OF TODAY'S PLAN:  Continue vent with no changes HD today  Best Practice / Goals of Care / Disposition.   DVT PROPHYLAXIS:On full anticoagulation NUTRITION:Vital TF MOBILITY:BR GOALS OF CARE:See nexzt FAMILY DISCUSSIONS: Had lengthy discussion with husband and Palliative Care at bedside; uncertain to what extent this was grasped by the patient. Discussed nature of MSOF and  if aggressive care is to continue, patient will likely need trach and PEG. Asked patient and husband to begin discussion about wishes of care and long-term plans. DISPOSITION ICU  LABS  Glucose Recent Labs  Lab 10/08/18 0802 10/08/18 1134 10/08/18 1534 10/08/18 1956 10/09/18 0035 10/09/18 0425  GLUCAP 163* 186* 216* 197* 136* 113*    BMET Recent Labs  Lab 10/08/18 0559 10/08/18 1935 10/09/18 0405  NA 135 136 137  K 3.0* 3.6 3.4*  CL 102 103 104  CO2 20* 22 21*  BUN 47* 60* 63*  CREATININE 2.78* 3.22* 3.49*  GLUCOSE 147* 195* 108*    Liver Enzymes Recent Labs  Lab 10/06/18 0350 10/07/18 0355 10/08/18 0559 10/09/18 0405  AST 174* 52* 103*  --   ALT 175* 91* 74*  --   ALKPHOS 107 93 94  --   BILITOT 0.6 0.6 0.6  --   ALBUMIN 1.3* 1.2*  1.2* 1.4* 1.3*     Electrolytes Recent Labs  Lab 10/05/18 1621 10/06/18 0350  10/08/18 0559 10/08/18 0929 10/08/18 1935 10/09/18 0405  CALCIUM  --  8.0*   < > 7.9*  --  7.6* 7.6*  MG 1.9 1.8  --   --  1.9  --   --   PHOS 2.0* 2.1*   < > 1.3*  --  5.0* 4.9*   < > = values in this interval not displayed.    CBC Recent Labs  Lab 10/07/18 0355 10/08/18 0559 10/09/18 0405  WBC 33.8* 28.3* 24.1*  HGB 7.4* 7.5* 7.0*  HCT 24.4* 26.6* 23.7*  PLT 301 PLATELET CLUMPS NOTED ON SMEAR, COUNT APPEARS DECREASED 245    ABG No results for input(s): PHART, PCO2ART, PO2ART in the last 168 hours.  Coag's Recent Labs  Lab 10/07/18 0355 10/08/18 0559 10/09/18 0405  APTT 51* 58* 72*    Sepsis Markers Recent Labs  Lab 10/03/18 1247 10/03/18 1527  LATICACIDVEN 1.2 1.3    Cardiac Enzymes No results for input(s): TROPONINI, PROBNP in the last 168 hours.  PAST MEDICAL HISTORY :   She  has a past medical history of Anemia, Arthritis, Asthma, Atrial fibrillation ([place]), Atrial flutter ([place]), Bone spur of toe, Chronic diastolic CHF (congestive heart  failure) ([place]), Colon polyposis, COPD (chronic obstructive pulmonary disease) ([place]), DVT of upper extremity (deep vein thrombosis) ([place]), ESRD (end stage renal disease) on dialysis [facility]), Essential hypertension, benign, Fractures, Glaucoma, Lupus ([place]), Pericarditis, SLE (systemic lupus erythematosus) ([place]), Type 2 diabetes mellitus ([place]), UTI (lower urinary tract infection), and Wound of right leg (06/2017).  PAST SURGICAL HISTORY:  She  has a past surgical history that includes Ankle surgery (1993); Back surgery (1980); Abdominal hysterectomy (1983); TEE without cardioversion (09/16/2012); Cardioversion (09/16/2012); Esophagogastroduodenoscopy (09/20/2012); Colonoscopy (09/20/2012); AV fistula placement (Left, 03/27/2014); Cardiovascular stress test (12/19/2009); Transesophageal echocardiogram with cardioversion (09/16/2012); Colonoscopy (2005); Colonoscopy (N/A,  07/24/2016); IR Fluoro Guide CV Line Right (11/09/2017); IR US Guide Vasc Access Right (11/09/2017); ABDOMINAL AORTOGRAM W/LOWER EXTREMITY (N/A, 12/18/2017); Bascilic vein transposition (Left, 01/13/2018); Cataract extraction; Eye surgery; Revison of arteriovenous fistula (Left, 03/15/2018); Esophagogastroduodenoscopy (N/A, 03/24/2018); Amputation (Right, 06/08/2018); IR Radiologist Eval & Mgmt (09/01/2018); Transmetatarsal amputation (Left, 09/06/2018); and Amputation (Left, 09/26/2018).  Allergies  Allergen Reactions  . Ace Inhibitors Cough    No current facility-administered medications on file prior to encounter.    Current Outpatient Medications on File Prior to Encounter  Medication Sig  . acetaminophen (TYLENOL) 325 MG tablet Take 1-2 tablets (325-650 mg total) by mouth every 4 (four) hours as needed for mild pain. (Patient taking differently: Take 325-650 mg by mouth every 4 (four) hours as needed (for mild to moderate pain). )  . albuterol (PROVENTIL HFA;VENTOLIN HFA) 108 (90 BASE) MCG/ACT inhaler Inhale 2 puffs into the lungs every 6 (six) hours as needed for wheezing or shortness of breath.   . allopurinol (ZYLOPRIM) 100 MG tablet Take 100 mg by mouth every Monday, Wednesday, and Friday.   . Amino Acids-Protein Hydrolys (FEEDING SUPPLEMENT, PRO-STAT SUGAR FREE 64,) LIQD Take 30 mLs by mouth 2 (two) times daily.  Marland Kitchen amiodarone (PACERONE) 200 MG tablet Take 1 tablet (200 mg total) by mouth daily.  Marland Kitchen apixaban (ELIQUIS) 5 MG TABS tablet Take 1 tablet (5 mg total) by mouth 2 (two) times daily.  Marland Kitchen atorvastatin (LIPITOR) 20 MG tablet Take 20 mg by mouth at bedtime.   . brimonidine-timolol (COMBIGAN) 0.2-0.5 % ophthalmic solution Place 1 drop into the left eye 2 (two) times daily.   . carbamazepine (TEGRETOL XR) 100 MG 12 hr tablet Take 100 mg by mouth 2 (two) times daily.  Marland Kitchen docusate sodium (COLACE) 100 MG capsule Take 100 mg by mouth 2 (two) times daily.  . fentaNYL (DURAGESIC - DOSED MCG/HR) 25  MCG/HR patch Place 25 mcg onto the skin every 3 (three) days.  . folic acid (FOLVITE) 867 MCG tablet Take 800 mcg by mouth daily.  . insulin aspart (NOVOLOG FLEXPEN) 100 UNIT/ML FlexPen Inject 5 Units into the skin 3 (three) times daily with meals as needed (if BGL is over 100).  Marland Kitchen latanoprost (XALATAN) 0.005 % ophthalmic solution Place 1 drop into both eyes at bedtime.  . metoprolol tartrate (LOPRESSOR) 25 MG tablet Take 1/2 tablet twice a day on Mondays, Wednesdays and Fridays (Patient taking differently: Take 12.5 mg by mouth See admin instructions. Take 12.5 mg by mouth two times a day on Mon/Wed/Fri)  . multivitamin (RENA-VIT) TABS tablet Take 1 tablet by mouth daily.  . Oxycodone HCl 10 MG TABS Take 10 mg by mouth every 6 (six) hours as needed (for moderate to severe pain).   . pantoprazole (PROTONIX) 40 MG tablet Take 1 tablet (40 mg total) by mouth daily.  . predniSONE (DELTASONE) 5 MG tablet Take 5  mg by mouth daily with breakfast.  . hydroxychloroquine (PLAQUENIL) 200 MG tablet Take 200 mg by mouth daily.   Marland Kitchen UNIFINE PENTIPS 31G X 6 MM MISC USE AS DIRECTED AT BEDTIME WITH LEVEMIR AND WITH SLIDING SCALE INSULIN. (Patient taking differently: as directed. )    FAMILY HISTORY:   Her family history includes Diabetes in her brother, father, and sister; Hypertension in her brother, father, and sister; Stroke in her mother. There is no history of Colon cancer.  SOCIAL HISTORY:  She  reports that she quit smoking about 6 years ago. Her smoking use included cigarettes. She has a 30.00 pack-year smoking history. She has never used smokeless tobacco. She reports that she does not drink alcohol or use drugs.  REVIEW OF SYSTEMS:    Per HPI  Critical care time: 75 min

## 2018-10-09 NOTE — Plan of Care (Signed)
  Problem: Clinical Measurements: Goal: Ability to maintain clinical measurements within normal limits will improve Outcome: Progressing Goal: Respiratory complications will improve Outcome: Progressing Goal: Cardiovascular complication will be avoided Outcome: Progressing   Problem: Nutrition: Goal: Adequate nutrition will be maintained Outcome: Progressing   

## 2018-10-09 NOTE — Clinical Social Work Note (Signed)
Clinical Social Worker received consult, noting patient from [facility].  Patient remains critical in the ICU with vent support and conversation with patient husband regarding potential trach placement.  Palliative medicine has met with patient husband in hopes to establish conversation regarding goals of care.  If patient continues to require vent support and patient family wishes to pursue trach placement, patient may require SNF placement out of state - please be sure that patient family is aware of this while discussing trach placement.  CSW will close consult and requests reconsult when/if patient is more appropriate for discharge planning.  CSW available for family support as needed.  Barbette Or, LCSW (weekend coverage) [phone]

## 2018-10-09 NOTE — Progress Notes (Signed)
Patient seen, chart reviewed.  Family meeting held with PCCM, Dr. Fara Olden, RN and [name].  Dr. Rennis Harding explained in great detail patient's unfortunate, complicated medical issues.  [name] remains very overwhelmed and continues to hope that his wife could guide him, give him direction on how to proceed in terms of tracheostomy, continuing hemodialysis, artificial feeding, and potentially living in a facility the rest of her life.  We also presented CODE STATUS again as a means of setting care limits not withdrawal of care.  [name] continues to verbalize "if it will give her a chance"; that he wants to do everything possible.  He states that he understands that she has been so sick that she likely will not be living at home again but he is clearly struggling with the complication of his wife's condition and how to proceed forward.   No changes to her plan of care were arrived at today.  Patient remains a full code, full scope of treatment.  [name] is a man of faith.  He views a large part of this is being in God's hands.   Palliative medicine to remain a resource for [name] and PCCM.  Thank you, Romona Curls, nurse practitioner  Time spent: 40 minutes 50% of time devoted to counseling and coordination of care

## 2018-10-10 LAB — RENAL FUNCTION PANEL
ALBUMIN: 1.5 g/dL — AB (ref 3.5–5.0)
Anion gap: 11 (ref 5–15)
BUN: 47 mg/dL — ABNORMAL HIGH (ref 8–23)
CO2: 22 mmol/L (ref 22–32)
Calcium: 7.7 mg/dL — ABNORMAL LOW (ref 8.9–10.3)
Chloride: 101 mmol/L (ref 98–111)
Creatinine, Ser: 2.95 mg/dL — ABNORMAL HIGH (ref 0.44–1.00)
GFR calc Af Amer: 18 mL/min — ABNORMAL LOW (ref >60)
GFR calc non Af Amer: 15 mL/min — ABNORMAL LOW (ref >60)
Glucose, Bld: 135 mg/dL — ABNORMAL HIGH (ref 70–99)
Phosphorus: 4.6 mg/dL (ref 2.5–4.6)
Potassium: 3.5 mmol/L (ref 3.5–5.1)
Sodium: 134 mmol/L — ABNORMAL LOW (ref 135–145)

## 2018-10-10 LAB — CBC
HCT: 26 % — ABNORMAL LOW (ref 36.0–46.0)
Hemoglobin: 7.7 g/dL — ABNORMAL LOW (ref 12.0–15.0)
MCH: 27 pg (ref 26.0–34.0)
MCHC: 29.6 g/dL — ABNORMAL LOW (ref 30.0–36.0)
MCV: 91.2 fL (ref 80.0–100.0)
NRBC: 0.2 % (ref 0.0–0.2)
Platelets: 254 10*3/uL (ref 150–400)
RBC: 2.85 MIL/uL — ABNORMAL LOW (ref 3.87–5.11)
RDW: 21.8 % — ABNORMAL HIGH (ref 11.5–15.5)
WBC: 21.9 10*3/uL — ABNORMAL HIGH (ref 4.0–10.5)

## 2018-10-10 LAB — CULTURE, BLOOD (ROUTINE X 2)
Culture: NO GROWTH
Culture: NO GROWTH
Special Requests: ADEQUATE
Special Requests: ADEQUATE

## 2018-10-10 LAB — GLUCOSE, CAPILLARY
Glucose-Capillary: 159 mg/dL — ABNORMAL HIGH (ref 70–99)
Glucose-Capillary: 140 mg/dL — ABNORMAL HIGH (ref 70–99)
Glucose-Capillary: 213 mg/dL — ABNORMAL HIGH (ref 70–99)
Glucose-Capillary: 172 mg/dL — ABNORMAL HIGH (ref 70–99)
Glucose-Capillary: 167 mg/dL — ABNORMAL HIGH (ref 70–99)

## 2018-10-10 LAB — APTT: aPTT: 48 seconds — ABNORMAL HIGH (ref 24–36)

## 2018-10-10 LAB — HEPARIN LEVEL (UNFRACTIONATED)
Heparin Unfractionated: 0.14 IU/mL — ABNORMAL LOW (ref 0.30–0.70)
Heparin Unfractionated: 0.46 IU/mL (ref 0.30–0.70)

## 2018-10-10 MED ORDER — VITAL AF 1.2 CAL PO LIQD
1000.0000 mL | ORAL | Status: DC
  Administered 2018-10-10: 1000 mL

## 2018-10-10 MED ORDER — PRO-STAT SUGAR FREE PO LIQD
60.0000 mL | Freq: Two times a day (BID) | ORAL | Status: DC
  Administered 2018-10-10: 60 mL
  Filled 2018-10-10: qty 60

## 2018-10-10 MED ORDER — PANTOPRAZOLE SODIUM 40 MG PO PACK
40.0000 mg | PACK | Freq: Two times a day (BID) | ORAL | Status: DC
  Administered 2018-10-10 – 2018-10-11 (×2): 40 mg
  Filled 2018-10-10 (×3): qty 20

## 2018-10-10 MED ORDER — INSULIN ASPART 100 UNIT/ML ~~LOC~~ SOLN
3.0000 [IU] | SUBCUTANEOUS | Status: DC
  Administered 2018-10-10 – 2018-10-11 (×7): 3 [IU] via SUBCUTANEOUS

## 2018-10-10 MED ORDER — DEXTROSE 5 % IV SOLN
INTRAVENOUS | Status: DC
  Administered 2018-10-10: 01:00:00 via INTRAVENOUS

## 2018-10-10 NOTE — Progress Notes (Signed)
[facility] Progress Note Patient Name: [name] DOB: Mar 17, 1946 MRN: [id number]   Date of Service  10/10/2018  HPI/Events of Note  Hyperglycemia - Blood glucose = 159. Nurse questions extra 3 units of insulin for tube feeding coverage as tube feeds only running at 20 mL/hour.   eICU Interventions  Will C/C tube feeding insulin coverage.      Intervention Category Major Interventions: Hyperglycemia - active titration of insulin therapy  Lysle Dingwall 10/10/2018, 4:33 AM

## 2018-10-10 NOTE — Progress Notes (Signed)
[name] for heparin Indication: atrial fibrillation / DVT  Allergies  Allergen Reactions  . Ace Inhibitors Cough    Patient Measurements: TBW 60.8 kg Heparin Dosing Weight: 58 kg  Vital Signs: Temp: 99 F (37.2 C) (01/12 0900) Temp Source: Esophageal (01/12 0400) BP: 112/36 (01/12 1147) Pulse Rate: 91 (01/12 1147)  Labs: Recent Labs    10/08/18 0559  10/08/18 1935 10/09/18 0405 10/09/18 0923 10/10/18 0238 10/10/18 1155  HGB 7.5*  --   --  7.0*  --  7.7*  --   HCT 26.6*  --   --  23.7*  --  26.0*  --   PLT PLATELET CLUMPS NOTED ON SMEAR, COUNT APPEARS DECREASED  --   --  245  --  254  --   APTT 58*  --   --  72*  --  48*  --   HEPARINUNFRC  --    < >  --   --  0.37 0.14* 0.46  CREATININE 2.78*  --  3.22* 3.49*  --  2.95*  --    < > = values in this interval not displayed.    Estimated Creatinine Clearance: 12.4 mL/min (A) (by C-G formula based on SCr of 2.95 mg/dL (H)).     Assessment: 73 yo F presents with wound infection. On Eliquis PTA for DVT and Afib- last dose was 1/4 at 1000.   Eliquis held due to suspicion for GIB.  Pharmacy asked to restart anticoagulation with IV heparin.  Heparin level is therapeutic (0.46) on heparin drip rate 900 uts/hr.  This is up quite a bit from overnight and HL and drip rate higher than has been  - with recent bleed and low but stable h/h will decrease rate slightly.   CBC is stable. No bleeding or infusion issues per RN.  Goal of Therapy:  Heparin level 0.3-0.7 Monitor platelets by anticoagulation protocol: Yes   Plan:  Decrease heparin gtt to 800 units/hr Monitor daily heparin level, CBC, s/s of bleed  Bonnita Nasuti Pharm.D. CPP, BCPS Clinical Pharmacist [phone] 10/10/2018 1:43 PM

## 2018-10-10 NOTE — Progress Notes (Addendum)
 NAME:  [name], MRN:  [id number], DOB:  09/06/1946, LOS: 16 ADMISSION DATE:  09/24/2018, CONSULTATION DATE: 10/01/2018 REFERRING MD: Dr. Goodrich, CHIEF COMPLAINT: Acute respiratory failure   Critical Care Attending Attestation Note:  I personally engaged with the patient today and performed a physical examination.  General:  Much more alert today and following commands, NAD HEENT:  [place]/AT; PERRL, EOMI; ETT/OGT in place Cardiovascular:  RRR Lungs:  Clear ant bilat Abdomen:  Diffuse tenderness without guarding Musculoskeletal:  old right AKA, new left AKA with staples in place, no erythema or drainage Skin:  Pressure ulcers   A/P: My assessment and plan is as described in the resident's note with the following: The patient did well on 8 hours of PSV yesterday. The RN reports minimal secretions. We will aim for at least 16 hours on PSV. If this is well tolerated, she might be able to be extubated tomorrow.   , M.D.   Critical care time: 30 min Brief History   72-year-old woman with end-stage renal disease, peripheral vascular disease, diabetes, hypertension with diastolic dysfunction.  Status post left AKA 09/26/2018 for LLE ischemia and metatarsal wound.  Poor p.o. intake, progressive weakness, A. fib since the procedure.  More more lethargy and difficulty with airway protection, now with pooling secretions and increased work of breathing. PCCM consulted for evolving resp failure.   History of present illness   72-year-old chronically ill woman with end-stage renal disease on hemodialysis, peripheral vascular disease, COPD, diabetes, hypertension with associated diastolic dysfunction and CHF, SLE on chronic prednisone and Plaquenil, atrial fibrillation on amiodarone and Eliquis.  She has a history of a right AKA in 05/2018, more recently a left transmetatarsal amputation 09/06/2018.  She was admitted on 12/27 with gangrene of that foot.  She was treated with vancomycin  and ceftriaxone, underwent left AKA on 12/29.  Subsequent course complicated by poor p.o. intake in the setting malnutrition, oral thrush and right flank pain.  Significant difficulty with secretion clearance, evolving poor cough and increased work of breathing.  Was felt that she was experiencing an overall decline and palliative care medicine was consulted.  On 1/3 she was progressively weak, unable to protect airway adequately or clear secretions at all.  Developing some degree of encephalopathy and showing evidence for respiratory failure. Patient's stated wishes are to undergo all potential life-saving therapies, confirmed to me by her husband at bedside today.  We performed NT suctioning and obtained copious thick green secretions were posterior pharynx. Plan intubation for airway protection and MV.   Past Medical History   has a past medical history of Anemia, Arthritis, Asthma, Atrial fibrillation (HCC), Atrial flutter (HCC), Bone spur of toe, Chronic diastolic CHF (congestive heart failure) (HCC), Colon polyposis, COPD (chronic obstructive pulmonary disease) (HCC), DVT of upper extremity (deep vein thrombosis) (HCC), ESRD (end stage renal disease) on dialysis (HCC), Essential hypertension, benign, Fractures, Glaucoma, Lupus (HCC), Pericarditis, SLE (systemic lupus erythematosus) (HCC), Type 2 diabetes mellitus (HCC), UTI (lower urinary tract infection), and Wound of right leg (06/2017).   Significant Hospital Events   12/27 admission with left lower extremity gangrenous wound 12/29 left AKA 1/3 transfer to ICU and intubated 1/4 OGT with bright red blood  Consults:  Vascular surgery Nephrology  Palliative care medicine Infectious disease   Procedures:  LUE fistula (pre-admit) R [place] tunneled HD catheter (pre-admit) Left AKA 12/29  Significant Diagnostic Tests:  Head CT 12/27 >> stable small left parafalcine meningioma, chronic small vessel disease, nothing acute   Right hip/pelvis x-ray  12/28 >> no pelvic or hip fracture, mild to moderate DJD of both hips EEG 1/2 >> global slowing without any epileptiform activity  Micro Data:  Blood cultures 12/27 >> negative Blood cultures 1/3 >> Negative Urine 1/1 >> multiple species, nondiagnostic Resp Cx 1/3>> ESBL E.coli Blood cultures 1/7 >> NGTD x4days  Antimicrobials:  Cefepime 12/27 >> 1/1; 1/3 >>  1/5 Vancomycin 12/27 >> 1/1; 1/3 >> 1/5 Fluconazole 1/2 >>   Flagyl 1/3 >>  1/5 Meropenem 1/5 >>   Interim history/subjective:  Patient is intubated but awake and alert. No family at bedside. She has spent about 8 hours being weaned off the ventilator yesterday.   Objective   Blood pressure (!) 118/35, pulse 94, temperature 99 F (37.2 C), resp. rate 20, height 5' (1.524 m), weight 54 kg, SpO2 94 %.    Vent Mode: PRVC FiO2 (%):  [40 %] 40 % Set Rate:  [18 bmp] 18 bmp Vt Set:  [360 mL] 360 mL PEEP:  [5 cmH20] 5 cmH20 Pressure Support:  [10 cmH20] 10 cmH20 Plateau Pressure:  [10 cmH20-18 cmH20] 18 cmH20   Intake/Output Summary (Last 24 hours) at 10/10/2018 1610 Last data filed at 10/10/2018 0600 Gross per 24 hour  Intake 1566.56 ml  Output 2102 ml  Net -535.44 ml   Filed Weights   10/09/18 1215 10/09/18 1642 10/10/18 0500  Weight: 58.5 kg 54.7 kg 54 kg    Physical Exam: General: Ill-appearing female, intubated, following commands, NAD HENT: [place], AT, ETT in place, OGT in place Eyes: EOMI, no scleral icterus Respiratory: Bilateral rhonchi, normal work of breathing Cardiovascular: RRR, no m/r/g, no JVD GI: Normoactive bowel sounds, soft, grimace to palpation over RLQ Extremities: old right AKA, new left AKA with staples in place, no erythema or drainage Neuro: Awake, follows commands, able to grasp my hands  CXR 10/02/18 independently reviewed by me - mild interstitial pulm edema, LLL atelectasis  Resolved Hospital Problem list     Assessment & Plan:   Acute multifactorial encephalopathy, chiefly metabolic2/2  sepsis Right forearm twitching noted earlier in hospitalization, EEG reassuring Depression ? Seizures  -Patient was following commands today, more alertness today. Her leukocytosis has improved to 21, remained afebrile overnight.  -CT head was unremarkable..  EEG showed PLEDs, unclear etiology however may be related to infection.  -Continuing to hold home carbamazepine. Hold Zoloft. This medication was started as an inpatient -Repeat CT head showed stable findings, no acute findings, atrophy with chronic small vessel ischemia -EEG showed periodic lateralized epileptiform discharges. Appears that this can be related to infection, CJD, anoxia, tumor, or hemorrhages.  -Minimize sedation -Clinically monitor  Septic shock secondary to ESBL E. coli pneumonia   Acute respiratory failure COPD Sputum culture grew out ESBL, treating with an 8 day course of meropenem. She also has c. Diff and is getting oral vancoymycin. Today patient appears to be more awake and alert, she is following commands today. She spent 8 hours being weaned down on the ventilator yesterday. She still may require a tracheostomy however since she has become more alert and awake and had a prolonged period of weaning off the vent yesterday there is a possibility that she may be able to be extubated. We will continue the current treatment and attempting to wean her off the vent.  -Continue mechanical ventilation -Continue meropenem x8 days  -Repeat blood cultures  >>> NGTD x4 day -Bronchodilators  -SBT daily -C. Diff toxigenic PCR  >>> positive for antigen -  Continue oral vancomycin 139m QID  -Chest physiotherapy, vibrate bed  Transaminitis: -Patient had an elevation in her AST and ALT up to 174 and 173 respectively.  Increased from her labs on 12/27. Alk Phos NL. Hepatitis B in 07/2018 was negative. -Patient may have developed a shock liver secondary to septic shock.   UGIB in setting of anticoagulation likely secondary to NG  tube trauma Chronic right-sided abdominal pain No BRB out of OG tube today, Hgb has been stable.  -Continue heparin drip for anticoagulation for A. Fib, need to transition back to Eliquis -Trend CBC  -Continue IV PPI BID   Chronic anemia Likely related to ESRD.  Hemoglobin has been stable -Weekly aranesp -Trend CBC as above  End-stage renal disease, HD TTS Hypokalemia, mild -HD per nephrology recommendations. Last dialysis was 1/11.  -K today was 3.5  Hypoalbuminemia: Albumin today was 1.5  -Tube feeds were decreased yesterday due to possible contribution to her loose stools. Loose stools have improved. Continue tube feeds at 30cc/hr, can increase as tolerated.  -Continue pro-stat  Recent LLE AKA for ischemia/gangrene  Prior RLE AKA Peripheral vascular disease S/p LLE AKA on 12/29 -Continue wound consult  Oral candidiasis -Fluconazole T-Th-S via HD  Hypertension Chronic diastolic CHF Paroxysmal atrial fibrillation -Continue Amiodarone -Continue heparin drip while inpatient -Hold apixaban   Diabetes: She is currently getting home prednisone. CBGs went down to 85 and patient was started on D5W. Next CBG was 159 and 3 units of novolog for tube feed coverage was discontinued. Most recent CBGs were 140 and 213. Will continue to hold novolog until tube feeds increase again. --D/C D5W --Sliding scale insulin  --CBGs have been elevated --Continue Lantus 5 units   SLE Outpatient regimen prednisone and Plaquenil --Hold Plaquenil given concern for possible sepsis --Continue home prednisone  3 cm right upper pole renal mass of unclear significance Not clear that any biopsy has been performed.  Question whether this may be contributing to current failure to thrive, presentation.  Consider repeat CT abdomen when stable to do so if we believe this will help with prognostication and overall goals for care.   Best practice:  Diet: Tube feeds Pain/Anxiety/Delirium protocol (if  indicated): intermittent versed, fentanyl  VAP protocol (if indicated): yes DVT prophylaxis: Heparin GI prophylaxis: PPI Glucose control: SSI Mobility: bedrest Code Status: Full  Family Communication:  Disposition: Remain in ICU    Labs   CBC: Recent Labs  Lab 10/06/18 0350 10/07/18 0355 10/08/18 0559 10/09/18 0405 10/10/18 0238  WBC 31.6* 33.8* 28.3* 24.1* 21.9*  HGB 8.2* 7.4* 7.5* 7.0* 7.7*  HCT 26.5* 24.4* 26.6* 23.7* 26.0*  MCV 86.9 87.8 93.7 91.9 91.2  PLT 316 301 PLATELET CLUMPS NOTED ON SMEAR, COUNT APPEARS DECREASED 245 2151   Basic Metabolic Panel: Recent Labs  Lab 10/04/18 1639 10/05/18 0338 10/05/18 1621 10/06/18 0350  10/07/18 0355 10/08/18 0559 10/08/18 0929 10/08/18 1935 10/09/18 0405 10/10/18 0238  NA  --  136  --  135   < > 136 135  --  136 137 134*  K  --  4.3  --  3.1*   < > 3.5 3.0*  --  3.6 3.4* 3.5  CL  --  100  --  100   < > 101 102  --  103 104 101  CO2  --  21*  --  24   < > 22 20*  --  22 21* 22  GLUCOSE  --  220*  --  391*   < > 277* 147*  --  195* 108* 135*  BUN  --  73*  --  42*   < > 79* 47*  --  60* 63* 47*  CREATININE  --  5.05*  --  2.95*   < > 3.86* 2.78*  --  3.22* 3.49* 2.95*  CALCIUM  --  8.6*  --  8.0*   < > 8.3* 7.9*  --  7.6* 7.6* 7.7*  MG 1.9 2.1 1.9 1.8  --   --   --  1.9  --   --   --   PHOS 5.3* 5.7* 2.0* 2.1*   < > 2.6 1.3*  --  5.0* 4.9* 4.6   < > = values in this interval not displayed.   GFR: Estimated Creatinine Clearance: 12.4 mL/min (A) (by C-G formula based on SCr of 2.95 mg/dL (H)). Recent Labs  Lab 10/03/18 1247 10/03/18 1527  10/07/18 0355 10/08/18 0559 10/09/18 0405 10/10/18 0238  WBC  --   --    < > 33.8* 28.3* 24.1* 21.9*  LATICACIDVEN 1.2 1.3  --   --   --   --   --    < > = values in this interval not displayed.    Liver Function Tests: Recent Labs  Lab 10/06/18 0350 10/07/18 0355 10/08/18 0559 10/09/18 0405 10/10/18 0238  AST 174* 52* 103*  --   --   ALT 175* 91* 74*  --   --     ALKPHOS 107 93 94  --   --   BILITOT 0.6 0.6 0.6  --   --   PROT 6.1* 5.7* 5.7*  --   --   ALBUMIN 1.3* 1.2*  1.2* 1.4* 1.3* 1.5*   No results for input(s): LIPASE, AMYLASE in the last 168 hours. No results for input(s): AMMONIA in the last 168 hours.  ABG    Component Value Date/Time   PHART 7.367 10/01/2018 1838   PCO2ART 48.0 10/01/2018 1838   PO2ART 158.0 (H) 10/01/2018 1838   HCO3 27.5 10/01/2018 1838   TCO2 29 10/01/2018 1838   ACIDBASEDEF 5.4 (H) 11/03/2017 1025   O2SAT 99.0 10/01/2018 1838     Coagulation Profile: No results for input(s): INR, PROTIME in the last 168 hours.  CBG: Recent Labs  Lab 10/09/18 1146 10/09/18 1556 10/09/18 2011 10/09/18 2320 10/10/18 0416  GLUCAP 226* 197* 116* 85 159*     Marissa M Krienke, M.D. PGY1 Pager [phone] 10/10/2018 6:21 AM      

## 2018-10-10 NOTE — Progress Notes (Signed)
[facility] Progress Note Patient Name: [name] DOB: October 18, 1945 MRN: [id number]   Date of Service  10/10/2018  HPI/Events of Note  Blood glucose = 85 - Patient is on Lantus + Novolog SSI. Patient is on low rate enteral nutrition at 20 mL/hour.   eICU Interventions  Will order: 1. D5W to run IV at 40 mL/hour.      Intervention Category Major Interventions: Other:  Lysle Dingwall 10/10/2018, 12:30 AM

## 2018-10-10 NOTE — Progress Notes (Signed)
[facility] Progress Note   Subjective:   Remains intubated, alert and responsive today   Objective Vitals:   10/10/18 0800 10/10/18 0817 10/10/18 0900 10/10/18 1147  BP: (!) 128/45  (!) 101/34 (!) 112/36  Pulse: 92  88 91  Resp: 20  (!) 21 (!) 30  Temp: 99 F (37.2 C)  99 F (37.2 C)   TempSrc:      SpO2: 100% 100% 100% 100%  Weight:      Height:       Physical Exam  General: on vent, responds to voice, nods head in response to question Heart: RRR 2/6 SEM  Lungs: clear bilat  Abdomen: soft NT/ND Extremities: bilat amp. NO stump edema Dialysis Access: R chest [facility]  Dialysis Orders:  [facility] TTS  3.5h  61kg   400/800  Hep 1800  R TDC Mircera 225 q2weeks (last 09/14/18)   Assessment:  1. Gangrene sp L AKA 12/29 2. ESBL EColi bacteremia/ PNA per CCM 3. Septic shock resolved 4. AMS better 5. CDif infection po vanc 6. ESRDon HD 7. Volume euvolemic on exam 8. VDRF weaning vent, may need trach 9. Anemia / ckd +ABLA - Aranesp 200 q Saturday 10. MBD ckd - phos dropped, binders stopped. P better 11. Nutrition/PCM. Very low albumin. Poor PO intake  supplements/vit. 12. SLE - plaquenil/solucortef  13. DM per primarySSI 14. Access - failed L AVF, per VVS plan LUE AVG when more stable 15. Afib- amiodarone/resumed apixaban.   Plan: 1. HD TTS  Kelly Splinter MD [facility] pgr [phone]   10/10/2018, 12:53 PM      Additional Objective Labs: Basic Metabolic Panel: Recent Labs  Lab 10/08/18 1935 10/09/18 0405 10/10/18 0238  NA 136 137 134*  K 3.6 3.4* 3.5  CL 103 104 101  CO2 22 21* 22  GLUCOSE 195* 108* 135*  BUN 60* 63* 47*  CREATININE 3.22* 3.49* 2.95*  CALCIUM 7.6* 7.6* 7.7*  PHOS 5.0* 4.9* 4.6   CBC: Recent Labs  Lab 10/06/18 0350 10/07/18 0355 10/08/18 0559 10/09/18 0405 10/10/18 0238  WBC 31.6* 33.8* 28.3* 24.1* 21.9*  HGB 8.2* 7.4* 7.5* 7.0* 7.7*  HCT 26.5* 24.4* 26.6* 23.7* 26.0*  MCV 86.9 87.8 93.7  91.9 91.2  PLT 316 301 PLATELET CLUMPS NOTED ON SMEAR, COUNT APPEARS DECREASED 245 254   Blood Culture    Component Value Date/Time   SDES BLOOD RIGHT ARM 10/05/2018 1621   SPECREQUEST  10/05/2018 1621    BOTTLES DRAWN AEROBIC ONLY Blood Culture adequate volume   CULT  10/05/2018 1621    NO GROWTH 5 DAYS Performed at [facility], [place] [address] [place], [place] [address]    REPTSTATUS 10/10/2018 FINAL 10/05/2018 1621    Cardiac Enzymes: No results for input(s): CKTOTAL, CKMB, CKMBINDEX, TROPONINI in the last 168 hours. CBG: Recent Labs  Lab 10/09/18 2011 10/09/18 2320 10/10/18 0416 10/10/18 0754 10/10/18 1139  GLUCAP 116* 85 159* 140* 213*   Iron Studies: No results for input(s): IRON, TIBC, TRANSFERRIN, FERRITIN in the last 72 hours. Lab Results  Component Value Date   INR 1.45 10/01/2018   INR 1.17 09/26/2018   INR 1.18 06/08/2018   Medications: . sodium chloride Stopped (10/09/18 1934)  . feeding supplement (VITAL AF 1.2 CAL) 40 mL/hr at 10/10/18 1248  . fentaNYL infusion INTRAVENOUS Stopped (10/03/18 0807)  . heparin 900 Units/hr (10/10/18 0900)  . meropenem (MERREM) IV Stopped (10/09/18 1742)   . amiodarone  200 mg  Per Tube Daily  . brimonidine  1 drop Left Eye BID   And  . timolol  1 drop Left Eye BID  . chlorhexidine gluconate (MEDLINE KIT)  15 mL Mouth Rinse BID  . Chlorhexidine Gluconate Cloth  6 each Topical Q0600  . darbepoetin (ARANESP) injection - DIALYSIS  200 mcg Intravenous Q Sat-HD  . feeding supplement (PRO-STAT SUGAR FREE 64)  60 mL Per Tube BID  . heparin  1,800 Units Dialysis Once in dialysis  . insulin aspart  0-9 Units Subcutaneous Q4H  . insulin aspart  3 Units Subcutaneous Q4H  . insulin glargine  5 Units Subcutaneous Daily  . ipratropium-albuterol  3 mL Nebulization BID  . latanoprost  1 drop Both Eyes QHS  . mouth rinse  15 mL Mouth Rinse 10 times per day  . pantoprazole (PROTONIX) IV  40 mg Intravenous Q12H  . predniSONE  5  mg Oral Q breakfast  . sodium chloride flush  3 mL Intravenous Q12H  . vancomycin  125 mg Per Tube QID

## 2018-10-10 NOTE — Progress Notes (Signed)
ANTICOAGULATION CONSULT NOTE - Follow Up Consult  Pharmacy Consult for heparin Indication: Afib and DVT  Labs: Recent Labs    10/08/18 0559  10/08/18 1634 10/08/18 1935 10/09/18 0405 10/09/18 0923 10/10/18 0238  HGB 7.5*  --   --   --  7.0*  --  7.7*  HCT 26.6*  --   --   --  23.7*  --  26.0*  PLT PLATELET CLUMPS NOTED ON SMEAR, COUNT APPEARS DECREASED  --   --   --  245  --  254  APTT 58*  --   --   --  72*  --  48*  HEPARINUNFRC  --    < > 0.37  --   --  0.37 0.14*  CREATININE 2.78*  --   --  3.22* 3.49*  --  2.95*   < > = values in this interval not displayed.    Assessment: 73yo female subtherapeutic on heparin after several levels at lower end of goal; no gtt issues or signs of bleeding per RN.  Goal of Therapy:  Heparin level 0.3-0.7 units/ml   Plan:  Will increase heparin gtt by 3 units/kg/hr to 900 units/hr and check level in 8 hours.    [name], PharmD, BCPS  10/10/2018,4:16 AM

## 2018-10-11 ENCOUNTER — Inpatient Hospital Stay (HOSPITAL_COMMUNITY): Payer: Medicare Other

## 2018-10-11 DIAGNOSIS — I480 Paroxysmal atrial fibrillation: Secondary | ICD-10-CM

## 2018-10-11 LAB — RENAL FUNCTION PANEL
Albumin: 1.4 g/dL — ABNORMAL LOW (ref 3.5–5.0)
Albumin: 1.4 g/dL — ABNORMAL LOW (ref 3.5–5.0)
Anion gap: 13 (ref 5–15)
Anion gap: 14 (ref 5–15)
BUN: 74 mg/dL — ABNORMAL HIGH (ref 8–23)
BUN: 81 mg/dL — ABNORMAL HIGH (ref 8–23)
CO2: 19 mmol/L — ABNORMAL LOW (ref 22–32)
CO2: 19 mmol/L — ABNORMAL LOW (ref 22–32)
CREATININE: 4.2 mg/dL — AB (ref 0.44–1.00)
Calcium: 8 mg/dL — ABNORMAL LOW (ref 8.9–10.3)
Calcium: 8.1 mg/dL — ABNORMAL LOW (ref 8.9–10.3)
Chloride: 101 mmol/L (ref 98–111)
Chloride: 100 mmol/L (ref 98–111)
Creatinine, Ser: 3.95 mg/dL — ABNORMAL HIGH (ref 0.44–1.00)
GFR calc Af Amer: 12 mL/min — ABNORMAL LOW (ref >60)
GFR calc Af Amer: 11 mL/min — ABNORMAL LOW (ref >60)
GFR calc non Af Amer: 11 mL/min — ABNORMAL LOW (ref >60)
GFR calc non Af Amer: 10 mL/min — ABNORMAL LOW (ref >60)
GLUCOSE: 187 mg/dL — AB (ref 70–99)
Glucose, Bld: 172 mg/dL — ABNORMAL HIGH (ref 70–99)
PHOSPHORUS: 5.7 mg/dL — AB (ref 2.5–4.6)
Phosphorus: 6.6 mg/dL — ABNORMAL HIGH (ref 2.5–4.6)
Potassium: 3 mmol/L — ABNORMAL LOW (ref 3.5–5.1)
Potassium: 3.6 mmol/L (ref 3.5–5.1)
Sodium: 133 mmol/L — ABNORMAL LOW (ref 135–145)
Sodium: 133 mmol/L — ABNORMAL LOW (ref 135–145)

## 2018-10-11 LAB — CBC
HCT: 22.9 % — ABNORMAL LOW (ref 36.0–46.0)
HCT: 27.1 % — ABNORMAL LOW (ref 36.0–46.0)
Hemoglobin: 6.6 g/dL — CL (ref 12.0–15.0)
Hemoglobin: 8.2 g/dL — ABNORMAL LOW (ref 12.0–15.0)
MCH: 27.2 pg (ref 26.0–34.0)
MCH: 27.7 pg (ref 26.0–34.0)
MCHC: 28.8 g/dL — ABNORMAL LOW (ref 30.0–36.0)
MCHC: 30.3 g/dL (ref 30.0–36.0)
MCV: 94.2 fL (ref 80.0–100.0)
MCV: 91.6 fL (ref 80.0–100.0)
Platelets: 285 10*3/uL (ref 150–400)
Platelets: 261 10*3/uL (ref 150–400)
RBC: 2.43 MIL/uL — ABNORMAL LOW (ref 3.87–5.11)
RBC: 2.96 MIL/uL — ABNORMAL LOW (ref 3.87–5.11)
RDW: 22.5 % — ABNORMAL HIGH (ref 11.5–15.5)
RDW: 20.5 % — ABNORMAL HIGH (ref 11.5–15.5)
WBC: 17.3 10*3/uL — ABNORMAL HIGH (ref 4.0–10.5)
WBC: 20.6 10*3/uL — ABNORMAL HIGH (ref 4.0–10.5)
nRBC: 0.2 % (ref 0.0–0.2)
nRBC: 0.2 % (ref 0.0–0.2)

## 2018-10-11 LAB — GLUCOSE, CAPILLARY
GLUCOSE-CAPILLARY: 105 mg/dL — AB (ref 70–99)
GLUCOSE-CAPILLARY: 184 mg/dL — AB (ref 70–99)
Glucose-Capillary: 191 mg/dL — ABNORMAL HIGH (ref 70–99)
Glucose-Capillary: 147 mg/dL — ABNORMAL HIGH (ref 70–99)
Glucose-Capillary: 157 mg/dL — ABNORMAL HIGH (ref 70–99)
Glucose-Capillary: 59 mg/dL — ABNORMAL LOW (ref 70–99)
Glucose-Capillary: 116 mg/dL — ABNORMAL HIGH (ref 70–99)
Glucose-Capillary: 123 mg/dL — ABNORMAL HIGH (ref 70–99)

## 2018-10-11 LAB — PREPARE RBC (CROSSMATCH)

## 2018-10-11 LAB — HEPARIN LEVEL (UNFRACTIONATED): Heparin Unfractionated: 0.34 IU/mL (ref 0.30–0.70)

## 2018-10-11 LAB — APTT: APTT: 99 s — AB (ref 24–36)

## 2018-10-11 MED ORDER — PANTOPRAZOLE SODIUM 40 MG IV SOLR
40.0000 mg | Freq: Every day | INTRAVENOUS | Status: DC
  Administered 2018-10-12 – 2018-10-18 (×8): 40 mg via INTRAVENOUS
  Filled 2018-10-11 (×8): qty 40

## 2018-10-11 MED ORDER — DEXTROSE 50 % IV SOLN
INTRAVENOUS | Status: AC
  Administered 2018-10-11: 50 mL
  Filled 2018-10-11: qty 50

## 2018-10-11 MED ORDER — CHLORHEXIDINE GLUCONATE CLOTH 2 % EX PADS
6.0000 | MEDICATED_PAD | Freq: Every day | CUTANEOUS | Status: DC
  Administered 2018-10-11 – 2018-10-18 (×5): 6 via TOPICAL

## 2018-10-11 MED ORDER — SODIUM CHLORIDE 0.9% IV SOLUTION: Freq: Once | INTRAVENOUS | Status: DC

## 2018-10-11 NOTE — Progress Notes (Signed)
[facility] for heparin Indication: atrial fibrillation / DVT  Allergies  Allergen Reactions  . Ace Inhibitors Cough  . Fentanyl     Fentanyl PATCH causes lethargy, somnolence, and AMS. Tolerated IV Fentanyl in small increments.    Patient Measurements: TBW 60.8 kg Heparin Dosing Weight: 58 kg  Vital Signs: Temp: 97.7 F (36.5 C) (01/13 0700) Temp Source: Esophageal (01/13 0400) BP: 121/36 (01/13 0742) Pulse Rate: 85 (01/13 0742)  Labs: Recent Labs    10/09/18 0405  10/10/18 0238 10/10/18 1155 10/11/18 0409  HGB 7.0*  --  7.7*  --  6.6*  HCT 23.7*  --  26.0*  --  22.9*  PLT 245  --  254  --  285  APTT 72*  --  48*  --  99*  HEPARINUNFRC  --    < > 0.14* 0.46 0.34  CREATININE 3.49*  --  2.95*  --  3.95*   < > = values in this interval not displayed.    Estimated Creatinine Clearance: 10.2 mL/min (A) (by C-G formula based on SCr of 3.95 mg/dL (H)).     Assessment: 73 yo F presents with wound infection. On Eliquis PTA for DVT and Afib- last dose was 1/4 at 1000.   Eliquis held due to suspicion for GIB.  Pharmacy asked to restart anticoagulation with IV heparin.  Heparin level remains therapeutic (0.34) on heparin drip rate 800 uts/hr.  Hemoglobin is down to 6.6 today but no bleeding or infusion issues per RN. Platelet count is stable. Per CCM, will plan to transfuse 1 unit pRBCs.  Goal of Therapy:  Heparin level 0.3-0.7 Monitor platelets by anticoagulation protocol: Yes   Plan:  Continue heparin gtt at 800 units/hr Monitor daily heparin level, CBC, s/s of bleeding  Jackson Latino, PharmD PGY1 Pharmacy Resident Phone [phone] 10/11/2018     8:39 AM

## 2018-10-11 NOTE — Progress Notes (Addendum)
NAME:  [name], MRN:  [id number], DOB:  13-Feb-1946, LOS: 61 ADMISSION DATE:  09/24/2018, CONSULTATION DATE: 10/01/2018 REFERRING MD: Dr. Sarajane Jews, CHIEF COMPLAINT: Acute respiratory failure   Brief History   73 year old woman with ESRD, peripheral vascular disease, diabetes, hypertension with diastolic dysfunction. Status post left AKA 09/26/2018 for LLE ischemia and metatarsal wound.  Poor p.o. intake, progressive weakness, A. fib since the procedure.  More more lethargy and difficulty with airway protection, now with pooling secretions and increased work of breathing. PCCM consulted for evolving resp failure.   History of present illness   73 year old chronically ill woman with ESRD on HD, PVD s/p left AKA 12/29, previous right AKA, COPD, TIIDM, HTN, diastolic HF, SLE on chronic prednisone and Plaquenil, Afib on amiodarone and Eliquis. Admitted on 12/27 with gangrene of left foot, treated with vanc and CTX, underwent left AKA on 12/29. Course complicated by poor p.o. intake in the setting malnutrition, oral thrush and right flank pain. Significant difficulty with secretion clearance, evolving poor cough and increased work of breathing in addition to malnutrition. Palliative consulted. Developed encephalopathy, unable to clear airway secretions and respiratory failure requiring intubation on 1/3.  Was felt that she was experiencing an overall decline and palliative care medicine was consulted. Patient's stated wishes are to undergo all potential life-saving therapies, confirmed to me by her husband at bedside today.  We performed NT suctioning and obtained copious thick green secretions were posterior pharynx.   Past Medical History   has a past medical history of Anemia, Arthritis, Asthma, Atrial fibrillation ([place]), Atrial flutter ([place]), Bone spur of toe, Chronic diastolic CHF (congestive heart failure) ([place]), Colon polyposis, COPD (chronic obstructive pulmonary disease) ([place]), DVT of upper  extremity (deep vein thrombosis) ([place]), ESRD (end stage renal disease) on dialysis [facility]), Essential hypertension, benign, Fractures, Glaucoma, Lupus ([place]), Pericarditis, SLE (systemic lupus erythematosus) ([place]), Type 2 diabetes mellitus ([place]), UTI (lower urinary tract infection), and Wound of right leg (06/2017).   Significant Hospital Events   12/27 admission with left lower extremity gangrenous wound 12/29 left AKA 1/3 transfer to ICU and intubated 1/4 OGT with bright red blood  Consults:  Vascular surgery Nephrology  Palliative care medicine Infectious disease   Procedures:  LUE fistula (pre-admit) R [place] tunneled HD catheter (pre-admit) Left AKA 12/29  Significant Diagnostic Tests:  Head CT 12/27 >> stable small left parafalcine meningioma, chronic small vessel disease, nothing acute Right hip/pelvis x-ray 12/28 >> no pelvic or hip fracture, mild to moderate DJD of both hips EEG 1/2 >> global slowing without any epileptiform activity  Micro Data:  Blood cultures 12/27 >> negative Blood cultures 1/3 >> Negative Urine 1/1 >> multiple species, nondiagnostic Resp Cx 1/3>> ESBL E.coli Blood cultures 1/7 >> NGTD x4days C. Diff positive 1/9   Antimicrobials:  Cefepime 12/27 >> 1/1; 1/3 >>  1/5 Vancomycin 12/27 >> 1/1; 1/3 >> 1/5 Fluconazole 1/2 >>   Flagyl 1/3 >>  1/5 Meropenem 1/5 >>  Oral Vanc 1/9 >>  Interim history/subjective:  Weaned since yesterday. Following commands and asking to have tube removed. Nods that she is feeling well and does not feel SOB. States she has no pain in AKA site.   Objective   Blood pressure (!) 139/30, pulse 84, temperature 97.7 F (36.5 C), resp. rate 19, height 5' (1.524 m), weight 57.7 kg, SpO2 100 %.    Vent Mode: PSV;CPAP FiO2 (%):  [30 %-40 %] 30 % PEEP:  [5 cmH20] 5 cmH20 Pressure Support:  [10  [place] Pressure:  [9 cmH20] 9 cmH20   Intake/Output Summary (Last 24 hours) at 10/11/2018 0616 Last data filed at  10/11/2018 0400 Gross per 24 hour  Intake 1917.18 ml  Output -  Net 1917.18 ml   Filed Weights   10/09/18 1642 10/10/18 0500 10/11/18 0500  Weight: 54.7 kg 54 kg 57.7 kg    Physical Exam: General: Ill-appearing female, intubated, following commands, NAD HENT: [place], AT, ETT in place, OGT in place Eyes: EOMI, no scleral icterus Respiratory: CTA, no wheezing, rales, or rhonchi  Cardiovascular: RRR, no m/r/g, no JVD GI: Normoactive bowel sounds, soft, NTTP Extremities: old right AKA, new left AKA with staples in place, no erythema or drainage Neuro: Awake, follows commands, strength UE 4/5 b/l    Resolved Hospital Problem list   Multifocal Encephalopathy  Right forearm Twitching Seizures?   Assessment & Plan:    Septic shock secondary to ESBL E. coli pneumonia   Acute Hypoxic Respiratory Failure on Vent COPD Current 8 day course merem. Weaned overnight, currently 5/5. Awake & alert, asking to have ETT removed.  -SBT/WUA daily  -Continue meropenem x8 days, stop date 1/14 -Repeat blood cultures  >>> NGTD x5 days -duonebs bid  - fentanyl prn  -Chest physiotherapy, vibrate bed  C. Diff Positive PCR 1/9.  -Continue oral vancomycin 115m QID   Transaminitis: Last CMP with AST 103, ALT 74 on 1/10.  Increased from her labs on 12/27. Alk Phos NL. Hepatitis B in 07/2018 was negative. Thought to be 2/2 sepsis with shock liver.  - am CMP   Acute on Chronic Anemia  UGIB in setting of anticoagulation likely secondary to NG tube trauma Chronic right-sided abdominal pain Hgb 6.6 from 7.7 yesterday. Previously seen BRB out of OG tube two days ago. Chronic anemia likely 2/2 ESRD and chronic disease although baseline unclear.  -Type & Screen, transfuse 1U RBCs -Continue heparin drip for anticoagulation for A. Fib, transition back to eliquis once hgb stable -Continue IV PPI BID  - weekly aranesp - daily CBC   End-stage renal disease, HD TTS Hypokalemia, mild -HD per nephrology  recommendations. Last dialysis was 1/11.   Recent LLE AKA for ischemia/gangrene  Prior RLE AKA Peripheral vascular disease S/p LLE AKA on 12/29 -Continue wound consult - OT/PT   Oral candidiasis -Fluconazole T-Th-S via HD  Hypertension Chronic diastolic CHF Paroxysmal atrial fibrillation -Continue Amiodarone -Continue heparin drip while inpatient -Hold apixaban   Type II Diabetes Hypoalbuminemia  On prednisone for SLE. Continue tube feeds at 30cc/hr, can increase as tolerated.  -Continue pro-stat -SSI sensitive - novolog q4h 3U  -Continue Lantus 5U qd  SLE Outpatient regimen prednisone and Plaquenil --Hold Plaquenil given concern for possible sepsis --Continue home prednisone  3 cm right upper pole renal mass of unclear significance Not clear that any biopsy has been performed.  Question whether this may be contributing to current failure to thrive, presentation.  Consider repeat CT abdomen when stable to do so if we believe this will help with prognostication and overall goals for care.   Best practice:  Diet: Tube feeds Pain/Anxiety/Delirium protocol (if indicated): intermittent fentanyl  VAP protocol (if indicated): yes DVT prophylaxis: Heparin GI prophylaxis: PPI Glucose control: SSI Mobility: bedrest Code Status: Full  Family Communication: no family at bedside Disposition: Remain in ICU    Labs   CBC: Recent Labs  Lab 10/07/18 0355 10/08/18 0559 10/09/18 0405 10/10/18 0238 10/11/18 0409  WBC 33.8* 28.3* 24.1* 21.9* 17.3*  HGB  7.4* 7.5* 7.0* 7.7* 6.6*  HCT 24.4* 26.6* 23.7* 26.0* 22.9*  MCV 87.8 93.7 91.9 91.2 94.2  PLT 301 PLATELET CLUMPS NOTED ON SMEAR, COUNT APPEARS DECREASED 245 254 161    Basic Metabolic Panel: Recent Labs  Lab 10/04/18 1639  10/05/18 0338 10/05/18 1621 10/06/18 0350  10/08/18 0559 10/08/18 0929 10/08/18 1935 10/09/18 0405 10/10/18 0238 10/11/18 0409  NA  --    < > 136  --  135   < > 135  --  136 137 134* 133*  K   --    < > 4.3  --  3.1*   < > 3.0*  --  3.6 3.4* 3.5 3.0*  CL  --    < > 100  --  100   < > 102  --  103 104 101 101  CO2  --    < > 21*  --  24   < > 20*  --  22 21* 22 19*  GLUCOSE  --    < > 220*  --  391*   < > 147*  --  195* 108* 135* 187*  BUN  --    < > 73*  --  42*   < > 47*  --  60* 63* 47* 74*  CREATININE  --    < > 5.05*  --  2.95*   < > 2.78*  --  3.22* 3.49* 2.95* 3.95*  CALCIUM  --    < > 8.6*  --  8.0*   < > 7.9*  --  7.6* 7.6* 7.7* 8.0*  MG 1.9  --  2.1 1.9 1.8  --   --  1.9  --   --   --   --   PHOS 5.3*  --  5.7* 2.0* 2.1*   < > 1.3*  --  5.0* 4.9* 4.6 5.7*   < > = values in this interval not displayed.   GFR: Estimated Creatinine Clearance: 10.2 mL/min (A) (by C-G formula based on SCr of 3.95 mg/dL (H)). Recent Labs  Lab 10/08/18 0559 10/09/18 0405 10/10/18 0238 10/11/18 0409  WBC 28.3* 24.1* 21.9* 17.3*    Liver Function Tests: Recent Labs  Lab 10/06/18 0350 10/07/18 0355 10/08/18 0559 10/09/18 0405 10/10/18 0238 10/11/18 0409  AST 174* 52* 103*  --   --   --   ALT 175* 91* 74*  --   --   --   ALKPHOS 107 93 94  --   --   --   BILITOT 0.6 0.6 0.6  --   --   --   PROT 6.1* 5.7* 5.7*  --   --   --   ALBUMIN 1.3* 1.2*  1.2* 1.4* 1.3* 1.5* 1.4*   No results for input(s): LIPASE, AMYLASE in the last 168 hours. No results for input(s): AMMONIA in the last 168 hours.  ABG    Component Value Date/Time   PHART 7.367 10/01/2018 1838   PCO2ART 48.0 10/01/2018 1838   PO2ART 158.0 (H) 10/01/2018 1838   HCO3 27.5 10/01/2018 1838   TCO2 29 10/01/2018 1838   ACIDBASEDEF 5.4 (H) 11/03/2017 1025   O2SAT 99.0 10/01/2018 1838     Coagulation Profile: No results for input(s): INR, PROTIME in the last 168 hours.  CBG: Recent Labs  Lab 10/10/18 1139 10/10/18 1549 10/10/18 2008 10/10/18 2359 10/11/18 0352  GLUCAP 213* 172* 167* 184* 191*     Marty Heck, M.D. PGY1 Pager [phone] 10/11/2018  6:16 AM

## 2018-10-11 NOTE — Progress Notes (Signed)
  Progress Note    10/11/2018 8:34 AM 15 Days Post-Op  Subjective:  Awake on vent.  Denies pain in leg with head nod  Afebrile 056'P-794'I systolic HR 01'K NSR 553% .30 FiO2    Vitals:   10/11/18 0700 10/11/18 0742  BP: (!) 121/36 (!) 121/36  Pulse: 85 85  Resp: 16 (!) 23  Temp: 97.7 F (36.5 C)   SpO2: 100% 100%    Physical Exam: Incisions:  Clean and dry with staples in tact.   CBC    Component Value Date/Time   WBC 17.3 (H) 10/11/2018 0409   RBC 2.43 (L) 10/11/2018 0409   HGB 6.6 (LL) 10/11/2018 0409   HGB 8.4 (L) 07/17/2017 1025   HCT 22.9 (L) 10/11/2018 0409   HCT 24.9 (L) 12/23/2017 1527   PLT 285 10/11/2018 0409   MCV 94.2 10/11/2018 0409   MCH 27.2 10/11/2018 0409   MCHC 28.8 (L) 10/11/2018 0409   RDW 22.5 (H) 10/11/2018 0409   LYMPHSABS 0.5 (L) 09/24/2018 1749   MONOABS 0.6 09/24/2018 1749   EOSABS 0.0 09/24/2018 1749   BASOSABS 0.0 09/24/2018 1749    BMET    Component Value Date/Time   NA 133 (L) 10/11/2018 0409   NA 140 08/09/2018 1159   K 3.0 (L) 10/11/2018 0409   CL 101 10/11/2018 0409   CO2 19 (L) 10/11/2018 0409   GLUCOSE 187 (H) 10/11/2018 0409   BUN 74 (H) 10/11/2018 0409   BUN 67 (H) 08/09/2018 1159   CREATININE 3.95 (H) 10/11/2018 0409   CALCIUM 8.0 (L) 10/11/2018 0409   CALCIUM 7.0 (L) 03/16/2014 0835   GFRNONAA 11 (L) 10/11/2018 0409   GFRAA 12 (L) 10/11/2018 0409    INR    Component Value Date/Time   INR 1.45 10/01/2018 1409     Intake/Output Summary (Last 24 hours) at 10/11/2018 0834 Last data filed at 10/11/2018 0700 Gross per 24 hour  Intake 1833.02 ml  Output -  Net 1833.02 ml     Assessment/Plan:  73 y.o. female is s/p left above knee amputation  15 Days Post-Op  -pt's incision looks good and is clean with staples in tact. -leukocytosis continues to improve & is afebrile -Upper GIB noted with acute blood loss anemia - per primary team. -RN reports hopeful for wean of vent today -pt will need LUE AVG when  she has recovered from this hospitalization   Leontine Locket, PA-C [facility] [phone] 10/11/2018 8:34 AM

## 2018-10-11 NOTE — Progress Notes (Signed)
[place] Progress Note Patient Name: [name] DOB: July 06, 1946 MRN: [id number]   Date of Service  10/11/2018  HPI/Events of Note  Called by Radiology - NGT in R lung.   eICU Interventions  Will order: 1. Remove and replace NGT.      Intervention Category Major Interventions: Other:  Lysle Dingwall 10/11/2018, 11:14 PM

## 2018-10-11 NOTE — Progress Notes (Signed)
CRITICAL VALUE ALERT  Critical Value:  Hgb 6.6  Date & Time Notied:  10/11/18 0601  Provider Notified: Warren Lacy RN  Orders Received/Actions taken: RN will pass along to [facility] MD

## 2018-10-11 NOTE — Progress Notes (Signed)
PT Cancellation Note  Patient Details Name: [name] MRN: [id number] DOB: 03-20-1946   Cancelled Treatment:    Reason Eval/Treat Not Completed: Medical issues which prohibited therapy(low Hgb and abnormal K levels will hold at this time).   Duncan Dull 10/11/2018, 7:59 AM  Alben Deeds, PT DPT  Board Certified Neurologic Specialist Acute Rehabilitation Services Pager [phone] Office [phone]

## 2018-10-11 NOTE — Progress Notes (Signed)
[facility] Progress Note Patient Name: [name] DOB: 1946-04-27 MRN: [id number]   Date of Service  10/11/2018  HPI/Events of Note  Patient is on Vancomycin PO for D. Difficile colitis. Unable to take PO d/t recent extubation and awaiting swallow study.   eICU Interventions  Will order: 1. Place NGT to LIS.      Intervention Category Major Interventions: Other:  Lysle Dingwall 10/11/2018, 9:46 PM

## 2018-10-11 NOTE — Progress Notes (Signed)
NAME:  [name], MRN:  [id number], DOB:  1946/08/23, LOS: 65 ADMISSION DATE:  09/24/2018, CONSULTATION DATE: 10/01/2018 REFERRING MD: Dr. Sarajane Jews, CHIEF COMPLAINT: Acute respiratory failure   Brief History   73 year old woman with ESRD, peripheral vascular disease, diabetes, hypertension with diastolic dysfunction. Status post left AKA 09/26/2018 for LLE ischemia and metatarsal wound.  Poor p.o. intake, progressive weakness, A. fib since the procedure.  More more lethargy and difficulty with airway protection, now with pooling secretions and increased work of breathing. PCCM consulted for evolving resp failure.   History of present illness   73 year old chronically ill woman with ESRD on HD, PVD s/p left AKA 12/29, previous right AKA, COPD, TIIDM, HTN, diastolic HF, SLE on chronic prednisone and Plaquenil, Afib on amiodarone and Eliquis. Admitted on 12/27 with gangrene of left foot, treated with vanc and CTX, underwent left AKA on 12/29. Course complicated by poor p.o. intake in the setting malnutrition, oral thrush and right flank pain. Significant difficulty with secretion clearance, evolving poor cough and increased work of breathing in addition to malnutrition. Palliative consulted. Developed encephalopathy, unable to clear airway secretions and respiratory failure requiring intubation on 1/3.  Was felt that she was experiencing an overall decline and palliative care medicine was consulted. Patient's stated wishes are to undergo all potential life-saving therapies, confirmed to me by her husband at bedside today.  We performed NT suctioning and obtained copious thick green secretions were posterior pharynx.   Past Medical History   has a past medical history of Anemia, Arthritis, Asthma, Atrial fibrillation ([place]), Atrial flutter ([place]), Bone spur of toe, Chronic diastolic CHF (congestive heart failure) ([place]), Colon polyposis, COPD (chronic obstructive pulmonary disease) ([place]), DVT of upper  extremity (deep vein thrombosis) ([place]), ESRD (end stage renal disease) on dialysis [facility]), Essential hypertension, benign, Fractures, Glaucoma, Lupus ([place]), Pericarditis, SLE (systemic lupus erythematosus) ([place]), Type 2 diabetes mellitus ([place]), UTI (lower urinary tract infection), and Wound of right leg (06/2017).  Significant Hospital Events   12/27 admission with left lower extremity gangrenous wound 12/29 left AKA 1/3 transfer to ICU and intubated 1/4 OGT with bright red blood  Consults:  Vascular surgery Nephrology  Palliative care medicine Infectious disease   Procedures:  LUE fistula (pre-admit) R [place] tunneled HD catheter (pre-admit) Left AKA 12/29  Significant Diagnostic Tests:  Head CT 12/27 >> stable small left parafalcine meningioma, chronic small vessel disease, nothing acute Right hip/pelvis x-ray 12/28 >> no pelvic or hip fracture, mild to moderate DJD of both hips EEG 1/2 >> global slowing without any epileptiform activity  Micro Data:  Blood cultures 12/27 >> negative Blood cultures 1/3 >> Negative Urine 1/1 >> multiple species, nondiagnostic Resp Cx 1/3>> ESBL E.coli Blood cultures 1/7 >> NGTD x4days C. Diff positive 1/9   Antimicrobials:  Cefepime 12/27 >> 1/1; 1/3 >>  1/5 Vancomycin 12/27 >> 1/1; 1/3 >> 1/5 Fluconazole 1/2 >>   Flagyl 1/3 >>  1/5 Meropenem 1/5 >>  Oral Vanc 1/9 >>   Interim history/subjective:  Weaning well this AM, no events overnight, no new complaints  Objective   Blood pressure (!) 121/36, pulse 85, temperature 97.7 F (36.5 C), resp. rate (!) 23, height 5' (1.524 m), weight 57.7 kg, SpO2 100 %.    Vent Mode: CPAP;PSV FiO2 (%):  [30 %-40 %] 30 % PEEP:  [5 cmH20] 5 cmH20 Pressure Support:  [5 cmH20-10 cmH20] 5 cmH20 Plateau Pressure:  [9 cmH20] 9 cmH20   Intake/Output Summary (Last 24 hours) at 10/11/2018  2202 Last data filed at 10/11/2018 0700 Gross per 24 hour  Intake 1833.02 ml  Output -  Net 1833.02 ml   Filed Weights     10/09/18 1642 10/10/18 0500 10/11/18 0500  Weight: 54.7 kg 54 kg 57.7 kg    Physical Exam: General: Chronically ill appearing female, weaning HEENT: [place]/AT, PERRL, EOM-I and MMM Respiratory: CTA bilaterally  Cardiovascular: RRR, Nl S1/S2 and -M/R/G GI: Soft, NT, ND and +BS Extremities: old right AKA, new left AKA with staples in place, no erythema or drainage Neuro: Awake, follows commands, strength UE 4/5 b/l   Resolved Hospital Problem list   Multifocal Encephalopathy  Right forearm Twitching Seizures?   I reviewed CXR myself, ETT is in a good position  Assessment & Plan:    Septic shock secondary to ESBL E. coli pneumonia   Acute Hypoxic Respiratory Failure on Vent COPD Current 8 day course merem. Weaned overnight, currently 5/5. Awake & alert, asking to have ETT removed.  -Extubate today -Titrate O2 for sat of 88-92% -SLP -Continue meropenem x8 days, stop date 1/14 -Repeat blood cultures  >>> NGTD x5 days -Duonebs bid  -Fentanyl prn  -Chest physiotherapy, vibrate bed  C. Diff Positive PCR 1/9.  -Continue oral vancomycin 175m QID   Transaminitis: Last CMP with AST 103, ALT 74 on 1/10.  Increased from her labs on 12/27. Alk Phos NL. Hepatitis B in 07/2018 was negative. Thought to be 2/2 sepsis with shock liver.  - am CMP   Acute on Chronic Anemia  UGIB in setting of anticoagulation likely secondary to NG tube trauma Chronic right-sided abdominal pain Hgb 6.6 from 7.7 yesterday. Previously seen BRB out of OG tube two days ago. Chronic anemia likely 2/2 ESRD and chronic disease although baseline unclear.  -Type & Screen, transfuse 1U RBCs -Continue heparin drip for anticoagulation for A. Fib, transition back to eliquis once hgb stable -Continue IV PPI BID  -Weekly aranesp -Daily CBC   End-stage renal disease, HD TTS Hypokalemia, mild -HD per nephrology recommendations. Last dialysis was 1/11.   Recent LLE AKA for ischemia/gangrene  Prior RLE  AKA Peripheral vascular disease S/p LLE AKA on 12/29 -Continue wound consult - OT/PT   Oral candidiasis -Fluconazole T-Th-S via HD  Hypertension Chronic diastolic CHF Paroxysmal atrial fibrillation -Continue Amiodarone -Continue heparin drip while inpatient -Heparin drip -Oral anti-coagulation when more stable and able to take PO  Type II Diabetes Hypoalbuminemia  On prednisone for SLE. Continue tube feeds at 30cc/hr, can increase as tolerated.  -Continue pro-stat -SSI sensitive - novolog q4h 3U  -Continue Lantus 5U qd  SLE Outpatient regimen prednisone and Plaquenil --Hold Plaquenil given concern for possible sepsis --Continue home prednisone  3 cm right upper pole renal mass of unclear significance Not clear that any biopsy has been performed.  Question whether this may be contributing to current failure to thrive, presentation.  Consider repeat CT abdomen when stable to do so if we believe this will help with prognostication and overall goals for care.   Best practice:  Diet: Tube feeds Pain/Anxiety/Delirium protocol (if indicated): intermittent fentanyl  VAP protocol (if indicated): yes DVT prophylaxis: Heparin GI prophylaxis: PPI Glucose control: SSI Mobility: bedrest Code Status: Full  Family Communication: no family at bedside Disposition: Remain in ICU    Labs   CBC: Recent Labs  Lab 10/07/18 0355 10/08/18 0559 10/09/18 0405 10/10/18 0238 10/11/18 0409  WBC 33.8* 28.3* 24.1* 21.9* 17.3*  HGB 7.4* 7.5* 7.0* 7.7* 6.6*  HCT 24.4* 26.6*  23.7* 26.0* 22.9*  MCV 87.8 93.7 91.9 91.2 94.2  PLT 301 PLATELET CLUMPS NOTED ON SMEAR, COUNT APPEARS DECREASED 245 254 027    Basic Metabolic Panel: Recent Labs  Lab 10/04/18 1639  10/05/18 0338 10/05/18 1621 10/06/18 0350  10/08/18 0559 10/08/18 0929 10/08/18 1935 10/09/18 0405 10/10/18 0238 10/11/18 0409  NA  --    < > 136  --  135   < > 135  --  136 137 134* 133*  K  --    < > 4.3  --  3.1*   < >  3.0*  --  3.6 3.4* 3.5 3.0*  CL  --    < > 100  --  100   < > 102  --  103 104 101 101  CO2  --    < > 21*  --  24   < > 20*  --  22 21* 22 19*  GLUCOSE  --    < > 220*  --  391*   < > 147*  --  195* 108* 135* 187*  BUN  --    < > 73*  --  42*   < > 47*  --  60* 63* 47* 74*  CREATININE  --    < > 5.05*  --  2.95*   < > 2.78*  --  3.22* 3.49* 2.95* 3.95*  CALCIUM  --    < > 8.6*  --  8.0*   < > 7.9*  --  7.6* 7.6* 7.7* 8.0*  MG 1.9  --  2.1 1.9 1.8  --   --  1.9  --   --   --   --   PHOS 5.3*  --  5.7* 2.0* 2.1*   < > 1.3*  --  5.0* 4.9* 4.6 5.7*   < > = values in this interval not displayed.   GFR: Estimated Creatinine Clearance: 10.2 mL/min (A) (by C-G formula based on SCr of 3.95 mg/dL (H)). Recent Labs  Lab 10/08/18 0559 10/09/18 0405 10/10/18 0238 10/11/18 0409  WBC 28.3* 24.1* 21.9* 17.3*    Liver Function Tests: Recent Labs  Lab 10/06/18 0350 10/07/18 0355 10/08/18 0559 10/09/18 0405 10/10/18 0238 10/11/18 0409  AST 174* 52* 103*  --   --   --   ALT 175* 91* 74*  --   --   --   ALKPHOS 107 93 94  --   --   --   BILITOT 0.6 0.6 0.6  --   --   --   PROT 6.1* 5.7* 5.7*  --   --   --   ALBUMIN 1.3* 1.2*  1.2* 1.4* 1.3* 1.5* 1.4*   No results for input(s): LIPASE, AMYLASE in the last 168 hours. No results for input(s): AMMONIA in the last 168 hours.  ABG    Component Value Date/Time   PHART 7.367 10/01/2018 1838   PCO2ART 48.0 10/01/2018 1838   PO2ART 158.0 (H) 10/01/2018 1838   HCO3 27.5 10/01/2018 1838   TCO2 29 10/01/2018 1838   ACIDBASEDEF 5.4 (H) 11/03/2017 1025   O2SAT 99.0 10/01/2018 1838     Coagulation Profile: No results for input(s): INR, PROTIME in the last 168 hours.  CBG: Recent Labs  Lab 10/10/18 1549 10/10/18 2008 10/10/18 2359 10/11/18 0352 10/11/18 0757  GLUCAP 172* 167* 184* 191* 147*   The patient is critically ill with multiple organ systems failure and requires high complexity decision making for assessment and  support,  frequent evaluation and titration of therapies, application of advanced monitoring technologies and extensive interpretation of multiple databases.   Critical Care Time devoted to patient care services described in this note is  32  Minutes. This time reflects time of care of this signee Dr Jennet Maduro. This critical care time does not reflect procedure time, or teaching time or supervisory time of PA/NP/Med student/Med Resident etc but could involve care discussion time.  Rush Farmer, M.D. [facility] Pulmonary/Critical Care Medicine. Pager: [phone]. After hours pager: [phone].

## 2018-10-11 NOTE — Procedures (Signed)
Extubation Procedure Note  Patient Details:   Name: ZEVA LEBER DOB: 18-Mar-1946 MRN: [id number]   Airway Documentation:    Vent end date: 10/11/18 Vent end time: 0926   Evaluation  O2 sats: stable throughout Complications: No apparent complications Patient did tolerate procedure well. Bilateral Breath Sounds: Diminished, Rhonchi   Yes   PT was able speak, made attempt at coughing. PT was placed on 3l [place]  RT to monitor   Joud Pettinato, Leonie Douglas 10/11/2018, 9:27 AM

## 2018-10-11 NOTE — Progress Notes (Signed)
OT Cancellation Note  Patient Details Name: [name] MRN: [id number] DOB: May 22, 1946   Cancelled Treatment:    Reason Eval/Treat Not Completed: Patient not medically ready. Pt with Hgb 6.6--will hold OT today. Golden Circle, OTR/L Acute Rehab Services Pager [phone] Office [phone]     Almon Register 10/11/2018, 7:59 AM

## 2018-10-11 NOTE — Progress Notes (Signed)
[facility] Progress Note Patient Name: [name] DOB: 12-18-1945 MRN: [id number]   Date of Service  10/11/2018  HPI/Events of Note  K+ = 3.0. ESRD on HD TTS.   eICU Interventions  Will defer K+ management to Nephrology service.      Intervention Category Major Interventions: Electrolyte abnormality - evaluation and management  Takeila Thayne Eugene 10/11/2018, 6:11 AM

## 2018-10-11 NOTE — Progress Notes (Signed)
[name] PROGRESS NOTE  Assessment/ Plan: Pt is a 73 y.o. yo female PVD, diabetes, hypertension, CHF, is status post AKA on 09/26/2018, ESRD, admitted with infected left foot, FTT, on vent for respiratory failure.  # ESRD TTS: Plan for dialysis tomorrow.  She has [place] for access.  Serum potassium is low today, monitor.  Dialysis with 4K bath.  Receiving blood transfusion as well.  Dialysis Orders:  [facility] TTS  3.5h  61kg   400/800  Hep 1800  R TDC Mircera 225 q2weeks (last 09/14/18)  # Anemia due to ESRD and ABLA: Check iron stores.  Receiving Aranesp every Saturday.  Plan for 1 unit of blood transfusion today.  # Secondary hyperparathyroidism: Ca, phosphorus acceptable.  Off binders now. Continue monitor.  # HTN/volume: volume and BP acceptable.  # Gangrene s/p L AKA 09/26/18.  # ESBL E coli bacteremia/pneumonia: On antibiotics per ICU.  Septic shock resolved.  #Respiratory failure: On vent.  Plan for extubation per pulmonary team.  Subjective: Seen and examined at bedside.  Intubated.  Following simple commands.  Denied pain, shortness of breath. Objective Vital signs in last 24 hours: Vitals:   10/11/18 0600 10/11/18 0626 10/11/18 0700 10/11/18 0742  BP:  (!) 113/44 (!) 121/36 (!) 121/36  Pulse: 84 82 85 85  Resp: '19 16 16 ' (!) 23  Temp: 97.7 F (36.5 C) 97.7 F (36.5 C) 97.7 F (36.5 C)   TempSrc:      SpO2: 100% 100% 100% 100%  Weight:      Height:       Weight change: -0.8 kg  Intake/Output Summary (Last 24 hours) at 10/11/2018 0849 Last data filed at 10/11/2018 0700 Gross per 24 hour  Intake 1833.02 ml  Output -  Net 1833.02 ml       Labs: Basic Metabolic Panel: Recent Labs  Lab 10/09/18 0405 10/10/18 0238 10/11/18 0409  NA 137 134* 133*  K 3.4* 3.5 3.0*  CL 104 101 101  CO2 21* 22 19*  GLUCOSE 108* 135* 187*  BUN 63* 47* 74*  CREATININE 3.49* 2.95* 3.95*  CALCIUM 7.6* 7.7* 8.0*  PHOS 4.9* 4.6 5.7*   Liver Function  Tests: Recent Labs  Lab 10/06/18 0350 10/07/18 0355 10/08/18 0559 10/09/18 0405 10/10/18 0238 10/11/18 0409  AST 174* 52* 103*  --   --   --   ALT 175* 91* 74*  --   --   --   ALKPHOS 107 93 94  --   --   --   BILITOT 0.6 0.6 0.6  --   --   --   PROT 6.1* 5.7* 5.7*  --   --   --   ALBUMIN 1.3* 1.2*  1.2* 1.4* 1.3* 1.5* 1.4*   No results for input(s): LIPASE, AMYLASE in the last 168 hours. No results for input(s): AMMONIA in the last 168 hours. CBC: Recent Labs  Lab 10/07/18 0355 10/08/18 0559 10/09/18 0405 10/10/18 0238 10/11/18 0409  WBC 33.8* 28.3* 24.1* 21.9* 17.3*  HGB 7.4* 7.5* 7.0* 7.7* 6.6*  HCT 24.4* 26.6* 23.7* 26.0* 22.9*  MCV 87.8 93.7 91.9 91.2 94.2  PLT 301 PLATELET CLUMPS NOTED ON SMEAR, COUNT APPEARS DECREASED 245 254 285   Cardiac Enzymes: No results for input(s): CKTOTAL, CKMB, CKMBINDEX, TROPONINI in the last 168 hours. CBG: Recent Labs  Lab 10/10/18 1549 10/10/18 2008 10/10/18 2359 10/11/18 0352 10/11/18 0757  GLUCAP 172* 167* 184* 191* 147*    Iron Studies: No results for  input(s): IRON, TIBC, TRANSFERRIN, FERRITIN in the last 72 hours. Studies/Results: No results found.  Medications: Infusions: . sodium chloride 10 mL/hr at 10/11/18 0700  . feeding supplement (VITAL AF 1.2 CAL) 1,000 mL (10/10/18 1634)  . fentaNYL infusion INTRAVENOUS Stopped (10/03/18 0807)  . heparin 800 Units/hr (10/11/18 0700)  . meropenem (MERREM) IV Stopped (10/10/18 1643)    Scheduled Medications: . sodium chloride   Intravenous Once  . amiodarone  200 mg Per Tube Daily  . brimonidine  1 drop Left Eye BID   And  . timolol  1 drop Left Eye BID  . chlorhexidine gluconate (MEDLINE KIT)  15 mL Mouth Rinse BID  . Chlorhexidine Gluconate Cloth  6 each Topical Q0600  . darbepoetin (ARANESP) injection - DIALYSIS  200 mcg Intravenous Q Sat-HD  . feeding supplement (PRO-STAT SUGAR FREE 64)  60 mL Per Tube BID  . heparin  1,800 Units Dialysis Once in dialysis  .  insulin aspart  0-9 Units Subcutaneous Q4H  . insulin aspart  3 Units Subcutaneous Q4H  . insulin glargine  5 Units Subcutaneous Daily  . ipratropium-albuterol  3 mL Nebulization BID  . latanoprost  1 drop Both Eyes QHS  . mouth rinse  15 mL Mouth Rinse 10 times per day  . pantoprazole sodium  40 mg Per Tube BID  . predniSONE  5 mg Oral Q breakfast  . sodium chloride flush  3 mL Intravenous Q12H  . vancomycin  125 mg Per Tube QID    have reviewed scheduled and prn medications.  Physical Exam: General:NAD, intubated Heart:RRR, s1s2, no rub Lungs: Bilateral coarse breath sound Abdomen:soft, Non-tender, non-distended Extremities:No edema Dialysis Access: TDC   Ovid Witman Prasad Janisha Bueso 10/11/2018,8:49 AM  LOS: 17 days

## 2018-10-12 ENCOUNTER — Inpatient Hospital Stay (HOSPITAL_COMMUNITY): Payer: Medicare Other

## 2018-10-12 ENCOUNTER — Encounter (HOSPITAL_COMMUNITY): Payer: Self-pay

## 2018-10-12 ENCOUNTER — Other Ambulatory Visit: Payer: Self-pay

## 2018-10-12 LAB — GLUCOSE, CAPILLARY
Glucose-Capillary: 64 mg/dL — ABNORMAL LOW (ref 70–99)
Glucose-Capillary: 218 mg/dL — ABNORMAL HIGH (ref 70–99)
Glucose-Capillary: 151 mg/dL — ABNORMAL HIGH (ref 70–99)
Glucose-Capillary: 70 mg/dL (ref 70–99)
Glucose-Capillary: 203 mg/dL — ABNORMAL HIGH (ref 70–99)

## 2018-10-12 LAB — COMPREHENSIVE METABOLIC PANEL
ALT: 22 U/L (ref 0–44)
AST: 30 U/L (ref 15–41)
Albumin: 1.5 g/dL — ABNORMAL LOW (ref 3.5–5.0)
Alkaline Phosphatase: 80 U/L (ref 38–126)
Anion gap: 16 — ABNORMAL HIGH (ref 5–15)
BUN: 85 mg/dL — ABNORMAL HIGH (ref 8–23)
CO2: 19 mmol/L — AB (ref 22–32)
Calcium: 8.3 mg/dL — ABNORMAL LOW (ref 8.9–10.3)
Chloride: 101 mmol/L (ref 98–111)
Creatinine, Ser: 4.73 mg/dL — ABNORMAL HIGH (ref 0.44–1.00)
GFR calc non Af Amer: 9 mL/min — ABNORMAL LOW (ref >60)
GFR, EST AFRICAN AMERICAN: 10 mL/min — AB (ref >60)
Glucose, Bld: 88 mg/dL (ref 70–99)
Potassium: 3.2 mmol/L — ABNORMAL LOW (ref 3.5–5.1)
Sodium: 136 mmol/L (ref 135–145)
TOTAL PROTEIN: 5.6 g/dL — AB (ref 6.5–8.1)
Total Bilirubin: 0.7 mg/dL (ref 0.3–1.2)

## 2018-10-12 LAB — IRON AND TIBC
Iron: 20 ug/dL — ABNORMAL LOW (ref 28–170)
Saturation Ratios: 17 % (ref 10.4–31.8)
TIBC: 119 ug/dL — ABNORMAL LOW (ref 250–450)
UIBC: 99 ug/dL

## 2018-10-12 LAB — BPAM RBC
BLOOD PRODUCT EXPIRATION DATE: 202001132359
ISSUE DATE / TIME: 202001130849
Unit Type and Rh: 600

## 2018-10-12 LAB — PHOSPHORUS: Phosphorus: 7.2 mg/dL — ABNORMAL HIGH (ref 2.5–4.6)

## 2018-10-12 LAB — CBC
HEMATOCRIT: 27.6 % — AB (ref 36.0–46.0)
Hemoglobin: 8.3 g/dL — ABNORMAL LOW (ref 12.0–15.0)
MCH: 27.6 pg (ref 26.0–34.0)
MCHC: 30.1 g/dL (ref 30.0–36.0)
MCV: 91.7 fL (ref 80.0–100.0)
Platelets: 281 10*3/uL (ref 150–400)
RBC: 3.01 MIL/uL — ABNORMAL LOW (ref 3.87–5.11)
RDW: 22.3 % — AB (ref 11.5–15.5)
WBC: 17.9 10*3/uL — AB (ref 4.0–10.5)
nRBC: 0.2 % (ref 0.0–0.2)

## 2018-10-12 LAB — FERRITIN: Ferritin: 2317 ng/mL — ABNORMAL HIGH (ref 11–307)

## 2018-10-12 LAB — TYPE AND SCREEN
ABO/RH(D): A POS
ANTIBODY SCREEN: NEGATIVE
UNIT DIVISION: 0

## 2018-10-12 LAB — APTT: aPTT: 67 seconds — ABNORMAL HIGH (ref 24–36)

## 2018-10-12 LAB — MAGNESIUM: Magnesium: 2 mg/dL (ref 1.7–2.4)

## 2018-10-12 LAB — HEPARIN LEVEL (UNFRACTIONATED)
Heparin Unfractionated: 0.28 IU/mL — ABNORMAL LOW (ref 0.30–0.70)
Heparin Unfractionated: 0.9 IU/mL — ABNORMAL HIGH (ref 0.30–0.70)

## 2018-10-12 MED ORDER — METHYLPREDNISOLONE SODIUM SUCC 125 MG IJ SOLR
INTRAMUSCULAR | Status: AC
  Filled 2018-10-12: qty 2

## 2018-10-12 MED ORDER — OXYCODONE HCL 5 MG PO TABS
5.0000 mg | ORAL_TABLET | Freq: Four times a day (QID) | ORAL | Status: DC | PRN
  Administered 2018-10-12 – 2018-10-13 (×2): 5 mg via ORAL
  Filled 2018-10-12 (×3): qty 1

## 2018-10-12 MED ORDER — HEPARIN SODIUM (PORCINE) 1000 UNIT/ML DIALYSIS
1000.0000 [IU] | INTRAMUSCULAR | Status: DC | PRN
  Filled 2018-10-12 (×2): qty 1

## 2018-10-12 MED ORDER — CALCIUM ACETATE (PHOS BINDER) 667 MG/5ML PO SOLN
667.0000 mg | Freq: Three times a day (TID) | ORAL | Status: DC
  Administered 2018-10-13 – 2018-10-15 (×5): 667 mg via ORAL
  Filled 2018-10-12 (×11): qty 5

## 2018-10-12 MED ORDER — SODIUM CHLORIDE 0.9 % IV SOLN: 100.0000 mL | INTRAVENOUS | Status: DC | PRN

## 2018-10-12 MED ORDER — ALTEPLASE 2 MG IJ SOLR: 2.0000 mg | Freq: Once | INTRAMUSCULAR | Status: DC | PRN

## 2018-10-12 MED ORDER — DEXTROSE 10 % IV SOLN
INTRAVENOUS | Status: DC
  Administered 2018-10-12: 06:00:00 via INTRAVENOUS

## 2018-10-12 MED ORDER — METRONIDAZOLE IN NACL 5-0.79 MG/ML-% IV SOLN
500.0000 mg | Freq: Three times a day (TID) | INTRAVENOUS | Status: DC
  Administered 2018-10-12: 500 mg via INTRAVENOUS
  Filled 2018-10-12: qty 100

## 2018-10-12 MED ORDER — DEXTROSE 50 % IV SOLN
INTRAVENOUS | Status: AC
  Administered 2018-10-12: 50 mL
  Filled 2018-10-12: qty 50

## 2018-10-12 MED ORDER — HYDROCORTISONE NA SUCCINATE PF 100 MG IJ SOLR
50.0000 mg | Freq: Four times a day (QID) | INTRAMUSCULAR | Status: DC
  Administered 2018-10-12 – 2018-10-14 (×9): 50 mg via INTRAVENOUS
  Filled 2018-10-12 (×9): qty 2

## 2018-10-12 NOTE — Progress Notes (Signed)
CRITICAL VALUE ALERT  Critical Value: BS was 79  Date & Time Notied: 10/12/2018 @0430   Provider Notified: Elink  Orders Received/Actions taken: 1 amp of D50 and start dextrose 10% infusion at 24ml/hr.

## 2018-10-12 NOTE — Progress Notes (Signed)
PT Cancellation Note  Patient Details Name: [name] MRN: [id number] DOB: 1946-04-03   Cancelled Treatment:    Reason Eval/Treat Not Completed: Medical issues which prohibited therapy(finishing on HD at this time) will hold   Duncan Dull 10/12/2018, 11:25 AM Alben Deeds, PT DPT  Board Certified Neurologic Specialist [place] Pager [phone] Office [phone]

## 2018-10-12 NOTE — Progress Notes (Addendum)
NAME:  [name], MRN:  [id number], DOB:  09-25-1946, LOS: 80 ADMISSION DATE:  09/24/2018, CONSULTATION DATE: 10/01/2018 REFERRING MD: Dr. Sarajane Jews, CHIEF COMPLAINT: Acute respiratory failure   Brief History   73 year old woman with ESRD, peripheral vascular disease, diabetes, hypertension with diastolic dysfunction. Status post left AKA 09/26/2018 for LLE ischemia and metatarsal wound.  Poor p.o. intake, progressive weakness, A. fib since the procedure.  More more lethargy and difficulty with airway protection, now with pooling secretions and increased work of breathing. PCCM consulted for evolving resp failure.   History of present illness   73 year old chronically ill woman with ESRD on HD, PVD s/p left AKA 12/29, previous right AKA, COPD, TIIDM, HTN, diastolic HF, SLE on chronic prednisone and Plaquenil, Afib on amiodarone and Eliquis. Admitted on 12/27 with gangrene of left foot, treated with vanc and CTX, underwent left AKA on 12/29. Course complicated by poor p.o. intake in the setting malnutrition, oral thrush and right flank pain. Significant difficulty with secretion clearance, evolving poor cough and increased work of breathing in addition to malnutrition. Palliative consulted. Developed encephalopathy, unable to clear airway secretions and respiratory failure requiring intubation on 1/3.  Was felt that she was experiencing an overall decline and palliative care medicine was consulted. Patient's stated wishes are to undergo all potential life-saving therapies, confirmed to me by her husband at bedside today.  We performed NT suctioning and obtained copious thick green secretions were posterior pharynx.   Past Medical History   has a past medical history of Anemia, Arthritis, Asthma, Atrial fibrillation ([place]), Atrial flutter ([place]), Bone spur of toe, Chronic diastolic CHF (congestive heart failure) ([place]), Colon polyposis, COPD (chronic obstructive pulmonary disease) ([place]), DVT of upper  extremity (deep vein thrombosis) ([place]), ESRD (end stage renal disease) on dialysis [facility]), Essential hypertension, benign, Fractures, Glaucoma, Lupus ([place]), Pericarditis, SLE (systemic lupus erythematosus) ([place]), Type 2 diabetes mellitus ([place]), UTI (lower urinary tract infection), and Wound of right leg (06/2017).   Significant Hospital Events   12/27 admission with left lower extremity gangrenous wound 12/29 left AKA 1/3 transfer to ICU and intubated >> ETT removed 1/13  1/4 OGT with bright red blood  Consults:  Vascular surgery Nephrology  Palliative care medicine Infectious disease   Procedures:  LUE fistula (pre-admit) R [place] tunneled HD catheter (pre-admit) Left AKA 12/29  Significant Diagnostic Tests:  Head CT 12/27 >> stable small left parafalcine meningioma, chronic small vessel disease, nothing acute Right hip/pelvis x-ray 12/28 >> no pelvic or hip fracture, mild to moderate DJD of both hips EEG 1/2 >> global slowing without any epileptiform activity  Micro Data:  Blood cultures 12/27 >> negative Blood cultures 1/3 >> Negative Urine 1/1 >> multiple species, nondiagnostic Resp Cx 1/3>> ESBL E.coli Blood cultures 1/7 >> NGTD x4days C. Diff positive 1/9   Antimicrobials:  Cefepime 12/27 >> 1/1; 1/3 >>  1/5 Vancomycin 12/27 >> 1/1; 1/3 >> 1/5 Fluconazole 1/2 >>   Flagyl 1/3 >>  1/5 Meropenem 1/5 >>  Oral Vanc 1/9 >>  Interim history/subjective:  Off supplemental O2. Unsuccessful attempt overnight to place NG tube for oral vanc. States her abdomen is not hurting and she is hungry. Speaking softly and difficult to hear but following commands.   Objective   Blood pressure (!) 134/29, pulse 78, temperature 97.8 F (36.6 C), temperature source Oral, resp. rate (!) 22, height 5' (1.524 m), weight 56.2 kg, SpO2 98 %.    Vent Mode: CPAP;PSV FiO2 (%):  [30 %] 30 %  PEEP:  [5 cmH20] 5 cmH20 Pressure Support:  [5 cmH20] 5 cmH20   Intake/Output Summary (Last 24 hours) at  10/12/2018 0618 Last data filed at 10/12/2018 0600 Gross per 24 hour  Intake 393.14 ml  Output -  Net 393.14 ml   Filed Weights   10/10/18 0500 10/11/18 0500 10/12/18 0500  Weight: 54 kg 57.7 kg 56.2 kg    Physical Exam: General: Ill-appearing female, NAD, following commands HENT: , AT Eyes: EOMI, no scleral icterus Respiratory: decreased breath sounds, wheezing, crackles on right Cardiovascular: RRR, no m/r/g, no JVD GI: normal BS, diffusely TTP, soft  Extremities: old right AKA, new left AKA with staples in place, no erythema or drainage Neuro: Awake, follows commands, strength UE 4/5 b/l    Resolved Hospital Problem list   Multifocal Encephalopathy  Right forearm Twitching Seizures?  Transaminitis   Assessment & Plan:    Septic shock secondary to ESBL E. coli pneumonia   Acute Hypoxic Respiratory Failure on Vent COPD Current 8 day course merem. Now on room air but wheezing on exam. Repeat blood cultures 1/9 negative.  -Continue meropenem x8 days, stop date 1/14 -cont duonebs bid  -Chest physiotherapy, vibrate bed - stable, can likely transfer to floor with regular HD tomorrow pending SLP swallow eval.   C. Diff Positive PCR 1/9. Unable to receive oral vanc yesterday, needs swallow eval s/p ETT removal and unable to place NG tube overnight. Discussed with speech and they will make her a priority this am.  - SLP eval pending. Will place NG tube if unable to swallow.  -Continue oral vancomycin 125mg  QID   Transaminitis: Resolved   Acute on Chronic Anemia  UGIB in setting of anticoagulation likely secondary to NG tube trauma Chronic right-sided abdominal pain Hgb 8.3 this am s/p 1U transfusion today. Previously seen BRB out of OG tube 1/12. Chronic anemia likely 2/2 ESRD and chronic disease although baseline unclear.  -Continue heparin drip for anticoagulation for A. Fib, transition back to eliquis once hgb stable -Continue IV PPI BID  - weekly aranesp - daily  CBC   End-stage renal disease, HD TTS Hypokalemia, mild -HD per nephrology recommendations. Last dialysis was 1/11.  - CRRT today   Recent LLE AKA for ischemia/gangrene  Prior RLE AKA Peripheral vascular disease S/p LLE AKA on 12/29 -Continue wound consult - OT/PT   Oral candidiasis -Fluconazole T-Th-S via HD  Hypertension Chronic diastolic CHF Paroxysmal atrial fibrillation -Continue Amiodarone -Continue heparin drip while inpatient -Hold apixaban   Type II Diabetes Hypoalbuminemia  Two episodes of hypoglycemia overnight. Started on D10W 20cc/hr.  -Continue pro-stat -SSI sensitive -hold novolog q4h 3U and Lantus 5U qd  SLE Outpatient regimen prednisone and Plaquenil --Hold Plaquenil given concern for possible sepsis  --Continue home prednisone  3 cm right upper pole renal mass of unclear significance Not clear that any biopsy has been performed.  Question whether this may be contributing to current failure to thrive, presentation.  Consider repeat CT abdomen when stable to do so if we believe this will help with prognostication and overall goals for care.   Best practice:  Diet: NPO  Pain/Anxiety/Delirium protocol (if indicated): n/a  VAP protocol (if indicated): no DVT prophylaxis: Heparin GI prophylaxis: PPI Glucose control: SSI Mobility: bedrest Code Status: Full  Family Communication: no family at bedside Disposition: stable for floor   Labs   CBC: Recent Labs  Lab 10/09/18 0405 10/10/18 0238 10/11/18 0409 10/11/18 1214 10/12/18 0318  WBC 24.1* 21.9* 17.3* 20.6*  17.9*  HGB 7.0* 7.7* 6.6* 8.2* 8.3*  HCT 23.7* 26.0* 22.9* 27.1* 27.6*  MCV 91.9 91.2 94.2 91.6 91.7  PLT 245 254 285 261 193    Basic Metabolic Panel: Recent Labs  Lab 10/05/18 1621 10/06/18 0350  10/08/18 0929  10/09/18 0405 10/10/18 0238 10/11/18 0409 10/11/18 1503 10/12/18 0318  NA  --  135   < >  --    < > 137 134* 133* 133* 136  K  --  3.1*   < >  --    < > 3.4* 3.5  3.0* 3.6 3.2*  CL  --  100   < >  --    < > 104 101 101 100 101  CO2  --  24   < >  --    < > 21* 22 19* 19* 19*  GLUCOSE  --  391*   < >  --    < > 108* 135* 187* 172* 88  BUN  --  42*   < >  --    < > 63* 47* 74* 81* 85*  CREATININE  --  2.95*   < >  --    < > 3.49* 2.95* 3.95* 4.20* 4.73*  CALCIUM  --  8.0*   < >  --    < > 7.6* 7.7* 8.0* 8.1* 8.3*  MG 1.9 1.8  --  1.9  --   --   --   --   --  2.0  PHOS 2.0* 2.1*   < >  --    < > 4.9* 4.6 5.7* 6.6* 7.2*   < > = values in this interval not displayed.   GFR: Estimated Creatinine Clearance: 8.5 mL/min (A) (by C-G formula based on SCr of 4.73 mg/dL (H)). Recent Labs  Lab 10/10/18 0238 10/11/18 0409 10/11/18 1214 10/12/18 0318  WBC 21.9* 17.3* 20.6* 17.9*    Liver Function Tests: Recent Labs  Lab 10/06/18 0350 10/07/18 0355 10/08/18 0559 10/09/18 0405 10/10/18 0238 10/11/18 0409 10/11/18 1503 10/12/18 0318  AST 174* 52* 103*  --   --   --   --  30  ALT 175* 91* 74*  --   --   --   --  22  ALKPHOS 107 93 94  --   --   --   --  80  BILITOT 0.6 0.6 0.6  --   --   --   --  0.7  PROT 6.1* 5.7* 5.7*  --   --   --   --  5.6*  ALBUMIN 1.3* 1.2*  1.2* 1.4* 1.3* 1.5* 1.4* 1.4* 1.5*   No results for input(s): LIPASE, AMYLASE in the last 168 hours. No results for input(s): AMMONIA in the last 168 hours.  ABG    Component Value Date/Time   PHART 7.367 10/01/2018 1838   PCO2ART 48.0 10/01/2018 1838   PO2ART 158.0 (H) 10/01/2018 1838   HCO3 27.5 10/01/2018 1838   TCO2 29 10/01/2018 1838   ACIDBASEDEF 5.4 (H) 11/03/2017 1025   O2SAT 99.0 10/01/2018 1838     Coagulation Profile: No results for input(s): INR, PROTIME in the last 168 hours.  CBG: Recent Labs  Lab 10/11/18 1616 10/11/18 2128 10/11/18 2221 10/11/18 2324 10/12/18 0425  GLUCAP 157* 59* 116* 123* 41*   Marty Heck, M.D. PGY1 Pager [phone] 10/12/2018 6:18 AM  Attending Note:  73 year old female with PMH of ESRD who presents to PCCM with c diff  and amputation of her left AKA.  Patient was extubated on 1/13 and has done well.  Transitioned to HD today.  On exam, she is alert and interactive, moving all ext to command with clear lungs.  I reviewed CXR myself, no acute disease noted.  Discussed with PCCM-NP and TRH-MD.  Acute respiratory failure:  - Maintain extubated  - Monitor for airway protection  Hypoxemia:   - Titrate O2 for sat of 88-92%  Dysphagia:  - Barium swallow via SLP  - Insert cortrak for TF in AM  Hypoglycemia:  - D10 at 20 ml/hr  Deconditioning:  - PT evaluation  ESRD:  - Transition from CRRT to standard HD  C diff: unable to take PO vanc  - Transition of IV flagyl until able to take PO then back to PO vanc  Hypotension:  - On prednisone 5 mg PO daily switch to stress dose steroids until able to take PO  Transfer to SDU and to [facility] service with PCCM off 1/15  Patient seen and examined, agree with above note.  I dictated the care and orders written for this patient under my direction.  Rush Farmer, [place]

## 2018-10-12 NOTE — Progress Notes (Signed)
eLink Physician-Brief Progress Note Patient Name: RAGHAD LORENZ DOB: 1946-06-06 MRN: [id number]   Date of Service  10/12/2018  HPI/Events of Note  Decubitus ulcer pain unrelieved by Tylenol  eICU Interventions  Oxycodone 5 mg po Q 6 hours prn breakthrough moderate to severe pain.        Kerry Kass Aydan Levitz 10/12/2018, 8:21 PM

## 2018-10-12 NOTE — Progress Notes (Signed)
[place] Progress Note Patient Name: [name] DOB: 07/04/1946 MRN: [id number]   Date of Service  10/12/2018  HPI/Events of Note  Multiple issues: 1. Two episodes of hypoglycemia to 59 and 64 and 2. K+ = 3.2. Patient with ESRD on HD.  eICU Interventions  Will order: 1. D10W IV infusion to run IV at 20 mL/hour. 2. Will NOT replace K+. Defer management to Nephrology service.      Intervention Category Major Interventions: Other:;Electrolyte abnormality - evaluation and management  Sommer,Steven Cornelia [place] 10/12/2018, 5:17 AM

## 2018-10-12 NOTE — Progress Notes (Signed)
Patient just finished up with dialysis and is refusing CPT at this time.

## 2018-10-12 NOTE — Progress Notes (Signed)
Modified Barium Swallow Progress Note  Patient Details  Name: [name] MRN: [id number] Date of Birth: 10/11/1945  Today's Date: 10/12/2018  Modified Barium Swallow completed.  Full report located under Chart Review in the Imaging Section.  Brief recommendations include the following:  Clinical Impression  Pt presents with a post-extubation dysphagia characterized by aspiration of tspn-sized boluses of nectar and thin liquids during the swallow.  There is adequate oral control, swallow onset, and pharyngeal constriction, but consistent spilling of materials into posterior larynx/trachea with no cough response elicited.  This pattern of aspiration is typical of post-extubation dysphagia, marked by probable glottal incompetence (corresponding with clinical sign of aphonia) and leading to aspiration between the posterior vocal folds without sensation.  Purees, due to their thicker nature, were not aspirated.  If necessary, PO meds may be given crushed in puree.  Pt would benefit from a cortrak if it can be successfully placed - she is not yet safe to begin a PO diet.  SLP will follow for PO readiness/diet progression.     Swallow Evaluation Recommendations       SLP Diet Recommendations: NPO except meds       Medication Administration: Crushed with puree               Oral Care Recommendations: Oral care QID      Brooklee Michelin L. Tivis Ringer, [place] Office number [phone] Pager [phone]   Juan Quam Laurice 10/12/2018,5:18 PM

## 2018-10-12 NOTE — Progress Notes (Signed)
[place] for heparin Indication: atrial fibrillation / DVT  Allergies  Allergen Reactions  . Ace Inhibitors Cough  . Fentanyl     Fentanyl PATCH causes lethargy, somnolence, and AMS. Tolerated IV Fentanyl in small increments.    Patient Measurements: TBW 60.8 kg Heparin Dosing Weight: TBW  Vital Signs: Temp: 97.8 F (36.6 C) (01/14 0000) Temp Source: Oral (01/14 0000) BP: 120/59 (01/14 0100) Pulse Rate: 86 (01/14 0100)  Labs: Recent Labs    10/10/18 0238 10/10/18 1155 10/11/18 0409 10/11/18 1214 10/11/18 1503 10/12/18 0318  HGB 7.7*  --  6.6* 8.2*  --  8.3*  HCT 26.0*  --  22.9* 27.1*  --  27.6*  PLT 254  --  285 261  --  281  APTT 48*  --  99*  --   --  67*  HEPARINUNFRC 0.14* 0.46 0.34  --   --  0.28*  CREATININE 2.95*  --  3.95*  --  4.20* 4.73*    Estimated Creatinine Clearance: 8.6 mL/min (A) (by C-G formula based on SCr of 4.73 mg/dL (H)).  Assessment: 73 yo F presents with wound infection. On Eliquis PTA for DVT and Afib- last dose was 1/4 at 1000.   Eliquis held due to suspicion for GIB.  Pharmacy asked to restart anticoagulation with IV heparin.  Heparin level down to slightly subtherapeutic (0.28) on heparin drip rate 800 uts/hr.  Hgb up to 8.2 (s/p PRBC 1/13), plt wnl.   Goal of Therapy:  Heparin level 0.3-0.7 units/ml Monitor platelets by anticoagulation protocol: Yes   Plan:  Increase heparin gtt to 900 units/hr Will f/u 8 hr heparin level  Sherlon Handing, PharmD, BCPS Clinical pharmacist  **Pharmacist phone directory can now be found on [email or web address] ([email or web address]).  Listed under [place]. 10/12/2018     5:03 AM

## 2018-10-12 NOTE — Evaluation (Signed)
Clinical/Bedside Swallow Evaluation Patient Details  Name: [name] MRN: [id number] Date of Birth: 1946/08/27  Today's Date: 10/12/2018 Time: SLP Start Time (ACUTE ONLY): 0845 SLP Stop Time (ACUTE ONLY): 0905 SLP Time Calculation (min) (ACUTE ONLY): 20 min  Past Medical History:  Past Medical History:  Diagnosis Date  . Anemia   . Arthritis   . Asthma   . Atrial fibrillation ([place])   . Atrial flutter ([place])    a. s/p TEE/DCCV December 2013 b. 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 Bone spur of toe    Right 5th toe  . Chronic diastolic CHF (congestive heart failure) (HCC)    a. EF 50-55% by echo in 2015  . Colon polyposis   . COPD (chronic obstructive pulmonary disease) ([place])   . DVT of upper extremity (deep vein thrombosis) ([place])    Right basilic vein, June 4010  . ESRD (end stage renal disease) on dialysis ([place])    "TTF; [place]; off [employer]." (09/07/2018)  . Essential hypertension, benign   . Fractures   . Glaucoma   . Lupus ([place])   . Pericarditis   . SLE (systemic lupus erythematosus) ([place])   . Type 2 diabetes mellitus ([place])   . UTI (lower urinary tract infection)   . Wound of right leg 06/2017   Past Surgical History:  Past Surgical History:  Procedure Laterality Date  . ABDOMINAL AORTOGRAM W/LOWER EXTREMITY N/A 12/18/2017   Procedure: ABDOMINAL AORTOGRAM W/LOWER EXTREMITY;  Surgeon: Elam Dutch, MD;  Location: [facility];  Service: Cardiovascular;  Laterality: N/A;  bilateral  . ABDOMINAL HYSTERECTOMY  1983  . AMPUTATION Right 06/08/2018   Procedure: RIGHT ABOVE KNEE AMPUTATION;  Surgeon: Elam Dutch, MD;  Location: [facility] OR;  Service: Vascular;  Laterality: Right;  . AMPUTATION Left 09/26/2018   Procedure: AMPUTATION ABOVE KNEE;  Surgeon: Rosetta Posner, MD;   Location: [facility] OR;  Service: Vascular;  Laterality: Left;  WILL BE AN INPATIENT  . ANKLE SURGERY  1993  . AV FISTULA PLACEMENT Left 03/27/2014   Procedure: ARTERIOVENOUS (AV) FISTULA CREATION;  Surgeon: Rosetta Posner, MD;  Location: [facility];  Service: Vascular;  Laterality: Left;  . BACK SURGERY  1980  . BASCILIC VEIN TRANSPOSITION Left 01/13/2018   Procedure: LEFT BASILIC VEIN TRANSPOSITION SECOND STAGE;  Surgeon: Elam Dutch, MD;  Location: [facility];  Service: Vascular;  Laterality: Left;  . CARDIOVASCULAR STRESS TEST  12/19/2009   no stress induced rhythm abnormalities, ekg negative for ischemia  . CARDIOVERSION  09/16/2012   Procedure: CARDIOVERSION;  Surgeon: Sanda Klein, MD;  Location: [facility];  Service: Cardiovascular;  Laterality: N/A;  . CATARACT EXTRACTION    . COLONOSCOPY  09/20/2012   Dr. [name]: multiple tubular adenomas   . COLONOSCOPY  2005   Dr. [name]. Polyps, path unknown   . COLONOSCOPY N/A 07/24/2016   Dr. [name]: 3 significant size polyps removed from the colon, tubular adenomas.  Next colonoscopy in October 2020.  Marland Kitchen ESOPHAGOGASTRODUODENOSCOPY  09/20/2012   Dr. [name]: duodenal erosion and possible resolving ulcer at the angularis   . ESOPHAGOGASTRODUODENOSCOPY N/A 03/24/2018   Dr. [name]: Mild erosive reflux esophagitis, erosive gastropathy and enteropathy fairly extensive, no H. pylori on biopsy.  Marland Kitchen EYE SURGERY    . IR FLUORO GUIDE CV LINE RIGHT  11/09/2017  .  IR RADIOLOGIST EVAL & MGMT  09/01/2018  . IR US GUIDE VASC ACCESS RIGHT  11/09/2017  . REVISON OF ARTERIOVENOUS FISTULA Left 03/15/2018   Procedure: REVISION OF Left arm BASILIC VEIN TRANSPOSITION;  Surgeon: Angelia Mould, MD;  Location: [facility];  Service: Vascular;  Laterality: Left;  . TEE WITHOUT CARDIOVERSION  09/16/2012   Procedure: TRANSESOPHAGEAL ECHOCARDIOGRAM (TEE);  Surgeon: Sanda Klein, MD;  Location: [facility] ENDOSCOPY;  Service: Cardiovascular;  Laterality: N/A;  . TRANSESOPHAGEAL  ECHOCARDIOGRAM WITH CARDIOVERSION  09/16/2012   EF 60-65%, moderate LVH, moderate regurg of the aortic valve, LA moderately dilated  . TRANSMETATARSAL AMPUTATION Left 09/06/2018   Procedure: TRANSMETATARSAL AMPUTATION;  Surgeon: Angelia Mould, MD;  Location: [facility] OR;  Service: Vascular;  Laterality: Left;   HPI:  Pt is a 73 y.o. female admitted with progressive gangrene of her left foot and is status post AKA on 09/26/2018. She has demonstrated poor p.o. intake, oral thush, as well as " significant difficulty with secretion clearance, evolving poor cough and increased work of breathing in addition to malnutrition". She developed encephalopathy, was unable to clear airway secretions and required intubation on 1/3 with extubation on 1/13. Nursing reported that NG tube placement was attempted four times without success.    Assessment / Plan / Recommendation Clinical Impression  Pt was alert and cooperative throughout the evaluation but she presented with dysphonia which negatively impacted speech intelligiblity. She was unable to follow all necessary commands for completion of a full oral mechanism exam. She tolerated puree solids without overt s/sx of aspiration. However, the number of dry secondary swallows increased with bolus size, suggesting possible pharyngeal residue. Coughing was noted with thin liquids via tsp and single small cup sip, indicating aspiration. Pharyngeal phase dysphagia secondary to prolonged intubation is suspected and a modified barium swallow study is recommended to further assess swallow function and to determine the least restrictive diet. The MBS is currently scheduled for 1300 and nursing has been advised of this.  SLP Visit Diagnosis: Dysphagia, oropharyngeal phase (R13.12)    Aspiration Risk  Moderate aspiration risk    Diet Recommendation NPO   Medication Administration: Crushed with puree(1/2 tsp boluses only) Compensations: Slow rate;Small  sips/bites Postural Changes: Seated upright at 90 degrees;Remain upright for at least 30 minutes after po intake    Other  Recommendations Oral Care Recommendations: Oral care QID   Follow up Recommendations        Frequency and Duration min 2x/week  2 weeks       Prognosis Prognosis for Safe Diet Advancement: Good      Swallow Study   General Date of Onset: 10/11/18 HPI: Pt is a 73 y.o. female admitted with progressive gangrene of her left foot and is status post AKA on 09/26/2018. She has demonstrated poor p.o. intake, oral thush, as well as " significant difficulty with secretion clearance, evolving poor cough and increased work of breathing in addition to malnutrition". She developed encephalopathy, was unable to clear airway secretions and required intubation on 1/3 with extubation on 1/13. Nursing reported that NG tube placement was attempted four times without success.  Type of Study: Bedside Swallow Evaluation Previous Swallow Assessment: BSE was completed on 09/29/18 and mild to moderate oropharyngeal dysphagia was suspected at that time.  Diet Prior to this Study: NPO Temperature Spikes Noted: No History of Recent Intubation: Yes Length of Intubations (days): 10 days Date extubated: 10/11/18 Behavior/Cognition: Alert;Cooperative;Requires cueing(Requires increased cues to follow commands) Oral Cavity Assessment: Within Functional Limits  Oral Care Completed by SLP: Recent completion by staff Oral Cavity - Dentition: Edentulous Patient Positioning: Upright in bed Baseline Vocal Quality: Breathy;Hoarse;Low vocal intensity Volitional Swallow: Able to elicit    Oral/Motor/Sensory Function Overall Oral Motor/Sensory Function: Other (comment)(Pt was unable to follow all the commands for assessment) Lingual ROM: Within Functional Limits   Ice Chips Ice chips: Within functional limits Presentation: Spoon   Thin Liquid Thin Liquid: Impaired Presentation: Spoon Pharyngeal  Phase  Impairments: Cough - Immediate;Multiple swallows;Suspected delayed Swallow    Nectar Thick Nectar Thick Liquid: Not tested   Honey Thick Honey Thick Liquid: Not tested   Puree Puree: Impaired Presentation: Spoon Pharyngeal Phase Impairments: Suspected delayed Swallow;Multiple swallows   Solid     Solid: Not tested      Horton Marshall 10/12/2018,9:45 AM  Tobie Poet I. Hardin Negus, [place], [place] Office number [phone] Pager [phone]

## 2018-10-12 NOTE — Progress Notes (Signed)
[place] for heparin Indication: atrial fibrillation / DVT  Allergies  Allergen Reactions  . Ace Inhibitors Cough  . Fentanyl     Fentanyl PATCH causes lethargy, somnolence, and AMS. Tolerated IV Fentanyl in small increments.    Patient Measurements: TBW 60.8 kg Heparin Dosing Weight: TBW  Vital Signs: Temp: 97.6 F (36.4 C) (01/14 1039) Temp Source: Oral (01/14 1039) BP: 111/79 (01/14 1200) Pulse Rate: 58 (01/14 1200)  Labs: Recent Labs    10/10/18 0238  10/11/18 0409 10/11/18 1214 10/11/18 1503 10/12/18 0318 10/12/18 1336  HGB 7.7*  --  6.6* 8.2*  --  8.3*  --   HCT 26.0*  --  22.9* 27.1*  --  27.6*  --   PLT 254  --  285 261  --  281  --   APTT 48*  --  99*  --   --  67*  --   HEPARINUNFRC 0.14*   < > 0.34  --   --  0.28* 0.90*  CREATININE 2.95*  --  3.95*  --  4.20* 4.73*  --    < > = values in this interval not displayed.    Estimated Creatinine Clearance: 8.4 mL/min (A) (by C-G formula based on SCr of 4.73 mg/dL (H)).  Assessment: 73 yo F presents with wound infection. On Eliquis PTA for DVT and Afib- last dose was 1/4 at 1000.   Eliquis held due to suspicion for GIB.  Pharmacy asked to restart anticoagulation with IV heparin.  Heparin level this afternoon up to 0.9 after rate increase. Per lab and RN, level was drawn from R arm, which is the same arm heparin was infusing in, and heparin drip was not paused very long. No bleeding or infusion issues noted per RN. Will recheck heparin level due to likely inaccurate lab draw.  Goal of Therapy:  Heparin level 0.3-0.7 units/ml Monitor platelets by anticoagulation protocol: Yes   Plan:  Continue heparin gtt at 900 units/hr Recheck heparin level at 1700 Check daily heparin level, CBC, s/sx of bleeding  Jackson Latino, PharmD PGY1 Pharmacy Resident Phone [phone] 10/12/2018     2:34 PM

## 2018-10-12 NOTE — Progress Notes (Signed)
Patient unable to have CPT due to receiving dialysis bedside.

## 2018-10-12 NOTE — Plan of Care (Signed)
  Problem: Clinical Measurements: Goal: Respiratory complications will improve Outcome: Progressing Goal: Cardiovascular complication will be avoided Outcome: Progressing   Problem: Pain Managment: Goal: General experience of comfort will improve Outcome: Progressing   Problem: Safety: Goal: Ability to remain free from injury will improve Outcome: Progressing   Problem: Respiratory: Goal: Ability to maintain a clear airway and adequate ventilation will improve Outcome: Progressing   Problem: Role Relationship: Goal: Method of communication will improve Outcome: Progressing

## 2018-10-12 NOTE — Progress Notes (Signed)
[address] Progress Note Patient Name: [name] DOB: [birth year] MRN: [id number]   Date of Service  10/12/2018  HPI/Events of Note  Multiple loose stools - Request for Flexiseal.   eICU Interventions  Will order: 1. Will order Flexiseal.      Intervention Category Major Interventions: Other:  Lysle Dingwall 10/12/2018, 12:10 AM

## 2018-10-12 NOTE — Progress Notes (Signed)
[facility] NEPHROLOGY PROGRESS NOTE  Assessment/ Plan: Pt is a 73 y.o. yo female PVD, diabetes, hypertension, CHF, is status post AKA on 09/26/2018, ESRD, admitted with infected left foot, FTT, required vent for respiratory failure.  Extubated 1/13 and now on room air.  # ESRD TTS: Plan for dialysis today.  She has [facility] for access.  Serum potassium is low today dialysis with 4K bath. 2-3 L UF. Dialysis Orders:  [facility] TTS  3.5h  61kg   400/800  Hep 1800  R TDC Mircera 225 q2weeks (last 09/14/18)  # Anemia due to ESRD and ABLA: Received blood transfusion yesterday, hemoglobin 8.3.  Ferritin more than 2000. Receiving Aranesp every Saturday.    # Secondary hyperparathyroidism: Phosphorus level elevated, start phoslyra when able to take oral, now npo. Continue monitor.  # HTN/volume: volume and BP acceptable.  # Gangrene s/p L AKA 09/26/18.  # ESBL E coli bacteremia/pneumonia: On antibiotics per ICU.  Septic shock resolved.  #Respiratory failure: now extubated and on room air.   Subjective: Seen and examined at bedside.  Extubated yesterday.  Doing well.  Denies chest pain, shortness of breath.  Objective Vital signs in last 24 hours: Vitals:   10/12/18 0900 10/12/18 0923 10/12/18 0930 10/12/18 1000  BP: (!) 100/37  (!) 91/30 (!) 99/17  Pulse: 96 (!) 157  90  Resp:      Temp:      TempSrc:      SpO2:      Weight:      Height:       Weight change: -1.5 kg  Intake/Output Summary (Last 24 hours) at 10/12/2018 1012 Last data filed at 10/12/2018 1000 Gross per 24 hour  Intake 404.35 ml  Output -  Net 404.35 ml       Labs: Basic Metabolic Panel: Recent Labs  Lab 10/11/18 0409 10/11/18 1503 10/12/18 0318  NA 133* 133* 136  K 3.0* 3.6 3.2*  CL 101 100 101  CO2 19* 19* 19*  GLUCOSE 187* 172* 88  BUN 74* 81* 85*  CREATININE 3.95* 4.20* 4.73*  CALCIUM 8.0* 8.1* 8.3*  PHOS 5.7* 6.6* 7.2*   Liver Function Tests: Recent Labs  Lab 10/07/18 0355  10/08/18 0559  10/11/18 0409 10/11/18 1503 10/12/18 0318  AST 52* 103*  --   --   --  30  ALT 91* 74*  --   --   --  22  ALKPHOS 93 94  --   --   --  80  BILITOT 0.6 0.6  --   --   --  0.7  PROT 5.7* 5.7*  --   --   --  5.6*  ALBUMIN 1.2*  1.2* 1.4*   < > 1.4* 1.4* 1.5*   < > = values in this interval not displayed.   No results for input(s): LIPASE, AMYLASE in the last 168 hours. No results for input(s): AMMONIA in the last 168 hours. CBC: Recent Labs  Lab 10/09/18 0405 10/10/18 0238 10/11/18 0409 10/11/18 1214 10/12/18 0318  WBC 24.1* 21.9* 17.3* 20.6* 17.9*  HGB 7.0* 7.7* 6.6* 8.2* 8.3*  HCT 23.7* 26.0* 22.9* 27.1* 27.6*  MCV 91.9 91.2 94.2 91.6 91.7  PLT 245 254 285 261 281   Cardiac Enzymes: No results for input(s): CKTOTAL, CKMB, CKMBINDEX, TROPONINI in the last 168 hours. CBG: Recent Labs  Lab 10/11/18 2221 10/11/18 2324 10/12/18 0425 10/12/18 0648 10/12/18 0745  GLUCAP 116* 123* 64* 218* 151*    Iron  Studies:  Recent Labs    10/12/18 0318  IRON 20*  TIBC 119*  FERRITIN 2,317*   Studies/Results: Dg Chest Port 1 View  Result Date: 10/12/2018 CLINICAL DATA:  Acute onset shortness of breath. In stage renal disease on hemodialysis. EXAM: PORTABLE CHEST 1 VIEW COMPARISON:  10/02/2018 and earlier. FINDINGS: Cardiac silhouette markedly enlarged, unchanged. Thoracic aorta atherosclerotic, unchanged. Hilar and mediastinal contours otherwise unremarkable. Pulmonary venous hypertension without overt edema currently. Streaky and patchy opacities at the MEDIAL RIGHT lung base. Lungs otherwise clear. Small BILATERAL pleural effusions, unchanged. RIGHT jugular dialysis catheter tips project at or near the cavoatrial junction, unchanged. Interval extubation and nasogastric tube removal. IMPRESSION: 1. Atelectasis and/or bronchopneumonia at the MEDIAL RIGHT lung base. Stable small BILATERAL pleural effusions. No acute cardiopulmonary disease otherwise. 2. Stable marked  cardiomegaly. Pulmonary venous hypertension without overt edema currently. Electronically Signed   By: Evangeline Dakin M.D.   On: 10/12/2018 09:03   Dg Abd Portable 1v  Result Date: 10/11/2018 CLINICAL DATA:  NG tube placement EXAM: PORTABLE ABDOMEN - 1 VIEW COMPARISON:  10/11/2018 FINDINGS: The enteric tube previously visualized over the right heart has been repositioned and advanced. Tube tip is now in the right lower lung suggesting position in a right lower lobe bronchus. Removal and repositioning is recommended. IMPRESSION: Enteric tube tip is in the right lower lung suggesting position in a right lower lobe bronchus. Removal and repositioning is recommended. These results were called by telephone at the time of interpretation on 10/11/2018 at 11:05 pm to Dr. Corinna Lines , who verbally acknowledged these results. Electronically Signed   By: Lucienne Capers M.D.   On: 10/11/2018 23:14   Dg Abd Portable 1v  Result Date: 10/11/2018 CLINICAL DATA:  NG tube placement EXAM: PORTABLE ABDOMEN - 1 VIEW COMPARISON:  10/07/2018 FINDINGS: A coiled segment of tubing, possibly the enteric tube, is demonstrated at the edge of the image and projected over the right heart. This could represent tube placement in the right mainstem bronchus. Chest radiograph suggested for further evaluation, or could remove and reposition the tube. No tubing demonstrated in the expected location of the stomach. Scattered gas in the colon and small bowel without distention. No radiopaque stones. Degenerative changes in the spine. IMPRESSION: Enteric tube appears to be coiled in the right mainstem bronchus. Recommend chest radiograph for further evaluation or removal and repositioning of the tube. Electronically Signed   By: Lucienne Capers M.D.   On: 10/11/2018 23:02    Medications: Infusions: . sodium chloride Stopped (10/11/18 0902)  . sodium chloride    . sodium chloride    . dextrose Stopped (10/12/18 0935)  . heparin 900  Units/hr (10/12/18 1000)  . metronidazole 100 mL/hr at 10/12/18 1000    Scheduled Medications: . sodium chloride   Intravenous Once  . amiodarone  200 mg Per Tube Daily  . brimonidine  1 drop Left Eye BID   And  . timolol  1 drop Left Eye BID  . Chlorhexidine Gluconate Cloth  6 each Topical Q0600  . Chlorhexidine Gluconate Cloth  6 each Topical Q0600  . darbepoetin (ARANESP) injection - DIALYSIS  200 mcg Intravenous Q Sat-HD  . heparin  1,800 Units Dialysis Once in dialysis  . hydrocortisone sodium succinate  50 mg Intravenous Q6H  . insulin aspart  0-9 Units Subcutaneous Q4H  . ipratropium-albuterol  3 mL Nebulization BID  . latanoprost  1 drop Both Eyes QHS  . pantoprazole (PROTONIX) IV  40 mg Intravenous QHS  .  sodium chloride flush  3 mL Intravenous Q12H  . vancomycin  125 mg Per Tube QID    have reviewed scheduled and prn medications.  Physical Exam: General:NAD, extubated,  Heart:RRR, s1s2, no rub Lungs: Clear lung sounds, no wheezing Abdomen:soft, Non-tender, non-distended Extremities:No edema Dialysis Access: TDC   Dron Prasad Bhandari 10/12/2018,10:12 AM  LOS: 18 days

## 2018-10-12 NOTE — Progress Notes (Addendum)
Nutrition Consult/Follow Up  DOCUMENTATION CODES:   Not applicable  INTERVENTION:    PO diet advancement vs TF re-initiation via Cortrak tube   If TF warranted will restart Vital AF 1.2 at goal rate of 60 ml/hr  Provides 1728 kcals, 108 gm protein, 1167 ml free water daily   NUTRITION DIAGNOSIS:   Inadequate oral intake now related to dysphagia(MBSS pending) as evidenced by NPO status, ongoing  GOAL:   Patient will meet greater than or equal to 90% of their needs, currently unmet  MONITOR:   Diet advancement, PO intake, Labs, Skin, Weight trends, I & O's  ASSESSMENT:    73 y.o. Female with PMH significant of HTN, DM, COPD, anemia, atrial fib and DVT on Eliquis, CHF, ESRD-HD (TTS), lupus, PAD, s/p R AKA. She presented on 12/27 with L foot pain. Patient reported having L transmetatarsal amputation on 09/06/18. There was concern for gangrene in the foot. She underwent L AKA on 12/29. Subsequent course complicated by poor p.o. intake in the setting malnutrition, oral thrush and right flank pain.   1/03 transfer to ICU and intubated 1/04 OGT with bright red blood  Pt extubated 1/13; TF (Vital AF 1.2) via OGT discontinued. S/p bedside swallow eval this AM >> pharyngeal phase dysphagia. SLP recommending NPO status; MBSS scheduled for this afternoon.  ESRD on HD; Nephrology following; plan for HD today. Medications reviewed; Lasix started 1/13. Labs reviewed; K 3.2 (L). P 7.2 (H).  CBG's [phone].  Diet Order:   Diet Order            Diet NPO time specified  Diet effective now             EDUCATION NEEDS:   Not appropriate for education at this time  Skin:  Skin Assessment: Skin Integrity Issues: DTI: left thigh (1/6), left buttocks (1/6) Stage II: ischial tuberosity (1/6) Stage III: sacrum (12/30) Incisions: L leg for AKA (12/29)  Last BM:  1/14   Intake/Output Summary (Last 24 hours) at 10/12/2018 1217 Last data filed at 10/12/2018 1039 Gross per 24 hour   Intake 388.37 ml  Output 1700 ml  Net -1311.63 ml   Height:   Ht Readings from Last 1 Encounters:  09/25/18 5' (1.524 m)   Weight:   Wt Readings from Last 1 Encounters:  10/12/18 56.1 kg   Ideal Body Weight:  45.45 kg  BMI:  26.8 kg/m2 >> adjusted for AKA  Estimated Nutritional Needs:   Kcal:  1700-1900  Protein:  95-110 gm  Fluid:  per MD  Arthur Holms, RD, LDN Pager #: [phone] After-Hours Pager #: [phone]

## 2018-10-13 DIAGNOSIS — R1310 Dysphagia, unspecified: Secondary | ICD-10-CM

## 2018-10-13 LAB — GLUCOSE, CAPILLARY
GLUCOSE-CAPILLARY: 189 mg/dL — AB (ref 70–99)
Glucose-Capillary: 50 mg/dL — ABNORMAL LOW (ref 70–99)
Glucose-Capillary: 161 mg/dL — ABNORMAL HIGH (ref 70–99)
Glucose-Capillary: 206 mg/dL — ABNORMAL HIGH (ref 70–99)
Glucose-Capillary: 182 mg/dL — ABNORMAL HIGH (ref 70–99)
Glucose-Capillary: 194 mg/dL — ABNORMAL HIGH (ref 70–99)
Glucose-Capillary: 177 mg/dL — ABNORMAL HIGH (ref 70–99)
Glucose-Capillary: 218 mg/dL — ABNORMAL HIGH (ref 70–99)

## 2018-10-13 LAB — HEPARIN LEVEL (UNFRACTIONATED)
Heparin Unfractionated: 0.25 IU/mL — ABNORMAL LOW (ref 0.30–0.70)
Heparin Unfractionated: 0.59 IU/mL (ref 0.30–0.70)

## 2018-10-13 LAB — CBC
HCT: 30.5 % — ABNORMAL LOW (ref 36.0–46.0)
Hemoglobin: 9.2 g/dL — ABNORMAL LOW (ref 12.0–15.0)
MCH: 27.8 pg (ref 26.0–34.0)
MCHC: 30.2 g/dL (ref 30.0–36.0)
MCV: 92.1 fL (ref 80.0–100.0)
Platelets: 252 10*3/uL (ref 150–400)
RBC: 3.31 MIL/uL — ABNORMAL LOW (ref 3.87–5.11)
RDW: 23.3 % — ABNORMAL HIGH (ref 11.5–15.5)
WBC: 16.4 10*3/uL — ABNORMAL HIGH (ref 4.0–10.5)
nRBC: 0.1 % (ref 0.0–0.2)

## 2018-10-13 LAB — MAGNESIUM: Magnesium: 1.9 mg/dL (ref 1.7–2.4)

## 2018-10-13 LAB — RENAL FUNCTION PANEL
ANION GAP: 14 (ref 5–15)
Albumin: 1.5 g/dL — ABNORMAL LOW (ref 3.5–5.0)
BUN: 23 mg/dL (ref 8–23)
CALCIUM: 8.2 mg/dL — AB (ref 8.9–10.3)
CO2: 23 mmol/L (ref 22–32)
Chloride: 96 mmol/L — ABNORMAL LOW (ref 98–111)
Creatinine, Ser: 2.26 mg/dL — ABNORMAL HIGH (ref 0.44–1.00)
GFR calc Af Amer: 24 mL/min — ABNORMAL LOW (ref >60)
GFR calc non Af Amer: 21 mL/min — ABNORMAL LOW (ref >60)
Glucose, Bld: 191 mg/dL — ABNORMAL HIGH (ref 70–99)
Phosphorus: 5.2 mg/dL — ABNORMAL HIGH (ref 2.5–4.6)
Potassium: 4.3 mmol/L (ref 3.5–5.1)
SODIUM: 133 mmol/L — AB (ref 135–145)

## 2018-10-13 MED ORDER — ACETAMINOPHEN 160 MG/5ML PO SOLN
650.0000 mg | Freq: Four times a day (QID) | ORAL | Status: DC | PRN
  Filled 2018-10-13: qty 20.3

## 2018-10-13 MED ORDER — ACETAMINOPHEN 325 MG PO TABS
650.0000 mg | ORAL_TABLET | Freq: Four times a day (QID) | ORAL | Status: DC
  Administered 2018-10-15 (×2): 650 mg via ORAL
  Filled 2018-10-13 (×3): qty 2

## 2018-10-13 MED ORDER — PRO-STAT SUGAR FREE PO LIQD
60.0000 mL | Freq: Two times a day (BID) | ORAL | Status: DC
  Administered 2018-10-13 – 2018-10-15 (×3): 60 mL via ORAL
  Filled 2018-10-13 (×3): qty 60

## 2018-10-13 MED ORDER — ACETAMINOPHEN 160 MG/5ML PO SOLN
650.0000 mg | Freq: Four times a day (QID) | ORAL | Status: DC
  Administered 2018-10-13 – 2018-10-15 (×5): 650 mg via ORAL
  Filled 2018-10-13 (×5): qty 20.3

## 2018-10-13 MED ORDER — FENTANYL CITRATE (PF) 100 MCG/2ML IJ SOLN: 12.5000 ug | Freq: Every day | INTRAMUSCULAR | Status: DC

## 2018-10-13 MED ORDER — ACETAMINOPHEN 325 MG PO TABS
650.0000 mg | ORAL_TABLET | Freq: Four times a day (QID) | ORAL | Status: DC | PRN
  Administered 2018-10-13: 650 mg via ORAL
  Filled 2018-10-13: qty 2

## 2018-10-13 MED ORDER — PSYLLIUM 95 % PO PACK
1.0000 | PACK | Freq: Every day | ORAL | Status: DC
  Administered 2018-10-13: 1 via ORAL
  Filled 2018-10-13 (×2): qty 1

## 2018-10-13 MED ORDER — COLLAGENASE 250 UNIT/GM EX OINT
TOPICAL_OINTMENT | Freq: Every day | CUTANEOUS | Status: DC
  Administered 2018-10-14 – 2018-10-15 (×2): via TOPICAL
  Filled 2018-10-13: qty 30

## 2018-10-13 MED ORDER — FENTANYL CITRATE (PF) 100 MCG/2ML IJ SOLN
12.5000 ug | Freq: Every day | INTRAMUSCULAR | Status: DC | PRN
  Administered 2018-10-14 – 2018-10-23 (×9): 12.5 ug via INTRAVENOUS
  Filled 2018-10-13 (×9): qty 2

## 2018-10-13 MED ORDER — VITAL AF 1.2 CAL PO LIQD
1000.0000 mL | ORAL | Status: DC
  Administered 2018-10-15 – 2018-10-18 (×2): 1000 mL
  Filled 2018-10-13 (×3): qty 1000

## 2018-10-13 MED ORDER — VITAL AF 1.2 CAL PO LIQD
1000.0000 mL | ORAL | Status: DC
  Administered 2018-10-13: 1000 mL

## 2018-10-13 NOTE — Consult Note (Addendum)
[place] Nurse wound follow up Wound type:sacral pressure injury, now unstageable, MDRPI to left thigh, stage 2 to bilateral ischiums Measurement:11cm x 11cm x unknown depth Wound bed:90% black, 10% pink around borders Drainage (amount, consistency, odor) scant, no odor noted Periwound:intact Dressing procedure/placement/frequency:  Patient is in poor nutritional state due to inability to tolerate tube feeds, now with no OG since extubated, inability to place NG. Pt to go to IR for Cortrak soon. Has been unable to have anti diarrheal meds due to positive C-Diff. Pt has now completed treatment for C-diff, rectal tube still in due to continues to have liquid diarrhea. Patient remains a full code, will recommend surgical consult for debridement of sacral wound. Will text page MD with [facility] to make sure recommendation seen. If surgical not an option will order Santyl enzymatic debridement or wet to dry dressings. Left ischial stage 2 wound remains 3cm x 1cm x 0.1cm pink, moist wound bed. Right ischium has 3cm x 3cm x 0.1cm stage 2 pink, moist wound bed. Left anterior thigh DTPI remains 3cm x 2cm, unknown depth with hard shell of eschar intact. Continue current treatment for these areas. Pt remains on 2H on mattress with low air loss feature. Nutrition will be addressed when tube is in place.  [place] team will continue to follow weekly, please call or re-consult if needs to be seen prior to next weekly visit. Will place new orders if needed after MD replies. Fara Olden, RN-C, WTA-C, [facility] Wound Treatment Associate Ostomy Care Associate   Have not been contacted by Dr. Barth Kirks. She has seen pt this am and has not made a surgical consult as recommended. Therefore orders placed for PT hydrotherapy Mon-Sat followed by Santyl. Bedside RN to perform on Sunday.  [place] team will continue to assess weekly Fara Olden, RN-C, WTA-C, [place] Treatment Associate Ostomy Care Associate

## 2018-10-13 NOTE — Progress Notes (Signed)
PROGRESS NOTE    [name]  LDJ:[id number] DOB: 03/06/1946 DOA: 09/24/2018 PCP: Sharilyn Sites, MD    Brief Narrative:  73 year old woman with ESRD, peripheral vascular disease, diabetes, hypertension with diastolic dysfunction. Status post left AKA 09/26/2018 for LLE ischemia and metatarsal wound.  Poor p.o. intake, progressive weakness, A. fib since the procedure.  More more lethargy and difficulty with airway protection, now with pooling secretions and increased work of breathing. PCCM consulted for evolving resp failure.  She was intubated on 10/01/2018 and extubated on 10/11/2018 and transferred to the hospitalist service.  Significant Hospital Events   12/27 admission with left lower extremity gangrenous wound 12/29 left AKA 1/3 transfer to ICU and intubated >> ETT removed 1/13  1/4 OGT with bright red blood -Now OG removed.  No evidence of bleeding.   Assessment & Plan:   Principal Problem:   Left foot infection Active Problems:   SLE (systemic lupus erythematosus) (HCC)   Essential hypertension, benign   COPD (chronic obstructive pulmonary disease) (HCC)   Chronic diastolic heart failure (HCC)   Goals of care, counseling/discussion   Mixed hyperlipidemia   Atrial fibrillation (HCC)   Type II diabetes mellitus with peripheral autonomic neuropathy (HCC)   ESRD on dialysis (HCC)   S/P AKA (above knee amputation) unilateral, right (HCC)   Status post amputation of left foot through metatarsal bone (HCC)   Pressure ulcer, stage 3 (HCC)   GERD (gastroesophageal reflux disease)   Gout   S/P AKA (above knee amputation) bilateral (HCC)   Acute on chronic anemia   Supplemental oxygen dependent   Hypertension   Acute on chronic diastolic CHF (congestive heart failure) (HCC)   FTT (failure to thrive) in adult   Oral candidiasis   Dysphagia   S/P AKA (above knee amputation) unilateral, left ([employer])   Palliative care by specialist   Septic shock secondary to ESBL E.  coli pneumonia   Acute Hypoxic Respiratory Failure on Vent COPD Completed 8 day course merem. Now on room air. Repeat blood cultures 1/9 negative.  -cont duonebs bid   C. Diff Positive PCR 1/9. On oral vancomycin 125mg  QID. -Patient failed swallow evaluation, high aspiration risk per speech therapy.  Plan to place core track  Leukocytosis WBC count elevated at 16.4.  However, she is also on Solu-Cortef. -She is to take prednisone at home for lupus. -Has C. difficile, on oral vancomycin.  Transaminitis: Resolved   Acute on Chronic Anemia  UGIB in setting of anticoagulation likely secondary to NG tube trauma Chronic right-sided abdominal pain - s/p 1U transfusion today. Previously seen BRB out of OG tube 1/12.  Now OG has been removed. -Hemoglobin this morning 9.2.  Chronic anemia likely 2/2 ESRD and chronic disease although baseline unclear.  -Continue heparin drip for anticoagulation for A. Fib, transition back to eliquis once hgb stable -Continue PPI  - weekly aranesp - daily CBC   End-stage renal disease, HD TTS Hypokalemia, mild -HD per nephrology recommendations.   Recent LLE AKA for ischemia/gangrene  Prior RLE AKA Peripheral vascular disease S/p LLE AKA on 12/29 -Continue wound consult - OT/PT   Oral candidiasis -Fluconazole T-Th-S via HD  Hypertension Chronic diastolic CHF Paroxysmal atrial fibrillation -Continue Amiodarone -Continue heparin drip while inpatient -Hold apixaban   Type II Diabetes Hypoalbuminemia  Continue accu checks, -SSI sensitive -hold novolog q4h 3U and Lantus 5U qd  SLE Outpatient regimen prednisone and Plaquenil --Held Plaquenil given concern for possible sepsis  --Currently on Solu-Cortef, plan to  transition to home prednisone  3 cm right upper pole renal mass of unclear significance Not clear that any biopsy has been performed.  Question whether this may be contributing to current failure to thrive, presentation.   Consider repeat CT abdomen when stable to do so if we believe this will help with prognostication and overall goals for care.  Palliative care consulted.  Patient at this time wishes to be full code.   DVT prophylaxis: IV heparin Code Status: Full code Family Communication: No family at bedside Disposition Plan: Likely home when stable  Consultants:   Vascular surgery  Nephrology   Procedures:  left AKA 09/26/2018  Antimicrobials:   Completed meropenem  Oral vancomycin   Subjective: Frail-appearing female, oriented x2, drowsy but arousable.  Denies any major complaints at this time.  Objective: Vitals:   10/13/18 0500 10/13/18 0600 10/13/18 0755 10/13/18 0800  BP: (!) 105/23 (!) 106/35  (!) 120/47  Pulse: 77 78  79  Resp: 20 16  20   Temp:   (!) 97.2 F (36.2 C)   TempSrc:   Oral   SpO2: 100% 100%  100%  Weight:      Height:        Intake/Output Summary (Last 24 hours) at 10/13/2018 0929 Last data filed at 10/13/2018 0900 Gross per 24 hour  Intake 776.15 ml  Output 1900 ml  Net -1123.85 ml   Filed Weights   10/12/18 0500 10/12/18 0703 10/12/18 1039  Weight: 56.2 kg 57.7 kg 56.1 kg    Examination:  General exam: Frail-appearing female, drowsy but arousable Respiratory system: Decreased breath sounds lower lobes, clear to auscultation. Respiratory effort normal. Cardiovascular system: S1 & S2 heard, RRR. No murmurs, rubs, gallops or clicks. No pedal edema. Gastrointestinal system: Abdomen is nondistended, soft and nontender. No organomegaly or masses felt. Normal bowel sounds heard. Central nervous system: Drowsy but arousable, oriented x2, generalized weakness without focal deficits Extremities: b/l AKA Skin: No rashes, lesions or ulcers Psychiatry: Judgement and insight appear normal. Mood & affect appropriate.     Data Reviewed: I have personally reviewed following labs and imaging studies  CBC: Recent Labs  Lab 10/10/18 0238 10/11/18 0409  10/11/18 1214 10/12/18 0318 10/13/18 0231  WBC 21.9* 17.3* 20.6* 17.9* 16.4*  HGB 7.7* 6.6* 8.2* 8.3* 9.2*  HCT 26.0* 22.9* 27.1* 27.6* 30.5*  MCV 91.2 94.2 91.6 91.7 92.1  PLT 254 285 261 281 948   Basic Metabolic Panel: Recent Labs  Lab 10/08/18 0929  10/10/18 0238 10/11/18 0409 10/11/18 1503 10/12/18 0318 10/13/18 0231  NA  --    < > 134* 133* 133* 136 133*  K  --    < > 3.5 3.0* 3.6 3.2* 4.3  CL  --    < > 101 101 100 101 96*  CO2  --    < > 22 19* 19* 19* 23  GLUCOSE  --    < > 135* 187* 172* 88 191*  BUN  --    < > 47* 74* 81* 85* 23  CREATININE  --    < > 2.95* 3.95* 4.20* 4.73* 2.26*  CALCIUM  --    < > 7.7* 8.0* 8.1* 8.3* 8.2*  MG 1.9  --   --   --   --  2.0 1.9  PHOS  --    < > 4.6 5.7* 6.6* 7.2* 5.2*   < > = values in this interval not displayed.   GFR: Estimated Creatinine Clearance: 17.7 mL/min (  A) (by C-G formula based on SCr of 2.26 mg/dL (H)). Liver Function Tests: Recent Labs  Lab 10/07/18 0355 10/08/18 0559  10/10/18 0238 10/11/18 0409 10/11/18 1503 10/12/18 0318 10/13/18 0231  AST 52* 103*  --   --   --   --  30  --   ALT 91* 74*  --   --   --   --  22  --   ALKPHOS 93 94  --   --   --   --  80  --   BILITOT 0.6 0.6  --   --   --   --  0.7  --   PROT 5.7* 5.7*  --   --   --   --  5.6*  --   ALBUMIN 1.2*  1.2* 1.4*   < > 1.5* 1.4* 1.4* 1.5* 1.5*   < > = values in this interval not displayed.   No results for input(s): LIPASE, AMYLASE in the last 168 hours. No results for input(s): AMMONIA in the last 168 hours. Coagulation Profile: No results for input(s): INR, PROTIME in the last 168 hours. Cardiac Enzymes: No results for input(s): CKTOTAL, CKMB, CKMBINDEX, TROPONINI in the last 168 hours. BNP (last 3 results) No results for input(s): PROBNP in the last 8760 hours. HbA1C: No results for input(s): HGBA1C in the last 72 hours. CBG: Recent Labs  Lab 10/12/18 1552 10/12/18 1949 10/13/18 0105 10/13/18 0453 10/13/18 0752  GLUCAP 161*  203* 206* 182* 189*   Lipid Profile: No results for input(s): CHOL, HDL, LDLCALC, TRIG, CHOLHDL, LDLDIRECT in the last 72 hours. Thyroid Function Tests: No results for input(s): TSH, T4TOTAL, FREET4, T3FREE, THYROIDAB in the last 72 hours. Anemia Panel: Recent Labs    10/12/18 0318  FERRITIN 2,317*  TIBC 119*  IRON 20*   Sepsis Labs: No results for input(s): PROCALCITON, LATICACIDVEN in the last 168 hours.  Recent Results (from the past 240 hour(s))  Culture, blood (routine x 2)     Status: None   Collection Time: 10/05/18  4:05 PM  Result Value Ref Range Status   Specimen Description BLOOD RIGHT HAND  Final   Special Requests   Final    BOTTLES DRAWN AEROBIC ONLY Blood Culture adequate volume   Culture   Final    NO GROWTH 5 DAYS Performed at [name], [address], [place], [place] [address]    Report Status 10/10/2018 FINAL  Final  Culture, blood (routine x 2)     Status: None   Collection Time: 10/05/18  4:21 PM  Result Value Ref Range Status   Specimen Description BLOOD RIGHT ARM  Final   Special Requests   Final    BOTTLES DRAWN AEROBIC ONLY Blood Culture adequate volume   Culture   Final    NO GROWTH 5 DAYS Performed at [facility], [address] [place], [place] [address]    Report Status 10/10/2018 FINAL  Final  C difficile quick scan w PCR reflex     Status: Abnormal   Collection Time: 10/06/18  7:07 PM  Result Value Ref Range Status   C Diff antigen POSITIVE (A) NEGATIVE Final   C Diff toxin NEGATIVE NEGATIVE Final   C Diff interpretation Results are indeterminate. See PCR results.  Final    Comment: Performed at [facility], [place] [address], [place], [place] [address]  C. Diff by PCR, Reflexed     Status: Abnormal  Collection Time: 10/06/18  7:07 PM  Result Value Ref Range Status   Toxigenic C. Difficile by PCR POSITIVE (A) NEGATIVE Final    Comment: Positive for toxigenic C. difficile with little to no toxin production. Only  treat if clinical presentation suggests symptomatic illness. Performed at [facility], [place] [address] [place], [place] [address]          Radiology Studies: Dg Chest Port 1 View  Result Date: 10/12/2018 CLINICAL DATA:  Acute onset shortness of breath. In stage renal disease on hemodialysis. EXAM: PORTABLE CHEST 1 VIEW COMPARISON:  10/02/2018 and earlier. FINDINGS: Cardiac silhouette markedly enlarged, unchanged. Thoracic aorta atherosclerotic, unchanged. Hilar and mediastinal contours otherwise unremarkable. Pulmonary venous hypertension without overt edema currently. Streaky and patchy opacities at the MEDIAL RIGHT lung base. Lungs otherwise clear. Small BILATERAL pleural effusions, unchanged. RIGHT jugular dialysis catheter tips project at or near the cavoatrial junction, unchanged. Interval extubation and nasogastric tube removal. IMPRESSION: 1. Atelectasis and/or bronchopneumonia at the MEDIAL RIGHT lung base. Stable small BILATERAL pleural effusions. No acute cardiopulmonary disease otherwise. 2. Stable marked cardiomegaly. Pulmonary venous hypertension without overt edema currently. Electronically Signed   By: Evangeline Dakin M.D.   On: 10/12/2018 09:03   Dg Abd Portable 1v  Result Date: 10/11/2018 CLINICAL DATA:  NG tube placement EXAM: PORTABLE ABDOMEN - 1 VIEW COMPARISON:  10/11/2018 FINDINGS: The enteric tube previously visualized over the right heart has been repositioned and advanced. Tube tip is now in the right lower lung suggesting position in a right lower lobe bronchus. Removal and repositioning is recommended. IMPRESSION: Enteric tube tip is in the right lower lung suggesting position in a right lower lobe bronchus. Removal and repositioning is recommended. These results were called by telephone at the time of interpretation on 10/11/2018 at 11:05 pm to Dr. Corinna Lines , who verbally acknowledged these results. Electronically Signed   By: Lucienne Capers M.D.   On: 10/11/2018  23:14   Dg Abd Portable 1v  Result Date: 10/11/2018 CLINICAL DATA:  NG tube placement EXAM: PORTABLE ABDOMEN - 1 VIEW COMPARISON:  10/07/2018 FINDINGS: A coiled segment of tubing, possibly the enteric tube, is demonstrated at the edge of the image and projected over the right heart. This could represent tube placement in the right mainstem bronchus. Chest radiograph suggested for further evaluation, or could remove and reposition the tube. No tubing demonstrated in the expected location of the stomach. Scattered gas in the colon and small bowel without distention. No radiopaque stones. Degenerative changes in the spine. IMPRESSION: Enteric tube appears to be coiled in the right mainstem bronchus. Recommend chest radiograph for further evaluation or removal and repositioning of the tube. Electronically Signed   By: Lucienne Capers M.D.   On: 10/11/2018 23:02   Dg Swallowing Func-speech Pathology  Result Date: 10/12/2018 Objective Swallowing Evaluation: Type of Study: MBS-Modified Barium Swallow Study  Patient Details Name: JALIAH FOODY MRN: [id number] Date of Birth: 1945/10/21 Today's Date: 10/12/2018 Time: SLP Start Time (ACUTE ONLY): 1300 -SLP Stop Time (ACUTE ONLY): 1330 SLP Time Calculation (min) (ACUTE ONLY): 30 min Past Medical History: Past Medical History: Diagnosis Date . Anemia  . Arthritis  . Asthma  . Atrial fibrillation ([place])  . Atrial flutter ([place])   a. s/p TEE/DCCV December 2013 b. 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 Bone spur of  toe   Right 5th toe . Chronic diastolic CHF (congestive heart failure) (HCC)   a. EF 50-55% by echo in 2015 . Colon polyposis  . COPD (chronic obstructive pulmonary disease) ([place])  . DVT of upper extremity (deep vein thrombosis) ([place])   Right basilic vein, June 7829 . ESRD (end stage renal  disease) on dialysis ([place])   "TTF; [place]; off [employer]." (09/07/2018) . Essential hypertension, benign  . Fractures  . Glaucoma  . Lupus ([place])  . Pericarditis  . SLE (systemic lupus erythematosus) ([place])  . Type 2 diabetes mellitus ([place])  . UTI (lower urinary tract infection)  . Wound of right leg 06/2017 Past Surgical History: Past Surgical History: Procedure Laterality Date . ABDOMINAL AORTOGRAM W/LOWER EXTREMITY N/A 12/18/2017  Procedure: ABDOMINAL AORTOGRAM W/LOWER EXTREMITY;  Surgeon: Elam Dutch, MD;  Location: [facility];  Service: Cardiovascular;  Laterality: N/A;  bilateral . ABDOMINAL HYSTERECTOMY  1983 . AMPUTATION Right 06/08/2018  Procedure: RIGHT ABOVE KNEE AMPUTATION;  Surgeon: Elam Dutch, MD;  Location: [facility] OR;  Service: Vascular;  Laterality: Right; . AMPUTATION Left 09/26/2018  Procedure: AMPUTATION ABOVE KNEE;  Surgeon: Rosetta Posner, MD;  Location: [facility] OR;  Service: Vascular;  Laterality: Left;  WILL BE AN INPATIENT . ANKLE SURGERY  1993 . AV FISTULA PLACEMENT Left 03/27/2014  Procedure: ARTERIOVENOUS (AV) FISTULA CREATION;  Surgeon: Rosetta Posner, MD;  Location: [facility];  Service: Vascular;  Laterality: Left; . BACK SURGERY  1980 . BASCILIC VEIN TRANSPOSITION Left 01/13/2018  Procedure: LEFT BASILIC VEIN TRANSPOSITION SECOND STAGE;  Surgeon: Elam Dutch, MD;  Location: [facility];  Service: Vascular;  Laterality: Left; . CARDIOVASCULAR STRESS TEST  12/19/2009  no stress induced rhythm abnormalities, ekg negative for ischemia . CARDIOVERSION  09/16/2012  Procedure: CARDIOVERSION;  Surgeon: Sanda Klein, MD;  Location: [facility];  Service: Cardiovascular;  Laterality: N/A; . CATARACT EXTRACTION   . COLONOSCOPY  09/20/2012  Dr. [name]: multiple tubular adenomas  . COLONOSCOPY  2005  Dr. [name]. Polyps, path unknown  . COLONOSCOPY N/A 07/24/2016  Dr. [name]: 3 significant size polyps removed from the colon, tubular adenomas.  Next colonoscopy in October 2020. Marland Kitchen  ESOPHAGOGASTRODUODENOSCOPY  09/20/2012  Dr. [name]: duodenal erosion and possible resolving ulcer at the angularis  . ESOPHAGOGASTRODUODENOSCOPY N/A 03/24/2018  Dr. [name]: Mild erosive reflux esophagitis, erosive gastropathy and enteropathy fairly extensive, no H. pylori on biopsy. Marland Kitchen EYE SURGERY   . IR FLUORO GUIDE CV LINE RIGHT  11/09/2017 . IR RADIOLOGIST EVAL & MGMT  09/01/2018 . IR US GUIDE VASC ACCESS RIGHT  11/09/2017 . REVISON OF ARTERIOVENOUS FISTULA Left 03/15/2018  Procedure: REVISION OF Left arm BASILIC VEIN TRANSPOSITION;  Surgeon: Angelia Mould, MD;  Location: [facility];  Service: Vascular;  Laterality: Left; . TEE WITHOUT CARDIOVERSION  09/16/2012  Procedure: TRANSESOPHAGEAL ECHOCARDIOGRAM (TEE);  Surgeon: Sanda Klein, MD;  Location: [facility] ENDOSCOPY;  Service: Cardiovascular;  Laterality: N/A; . TRANSESOPHAGEAL ECHOCARDIOGRAM WITH CARDIOVERSION  09/16/2012  EF 60-65%, moderate LVH, moderate regurg of the aortic valve, LA moderately dilated . TRANSMETATARSAL AMPUTATION Left 09/06/2018  Procedure: TRANSMETATARSAL AMPUTATION;  Surgeon: Angelia Mould, MD;  Location: [facility] OR;  Service: Vascular;  Laterality: Left; HPI: Pt is a 73 y.o. female admitted with progressive gangrene of her left foot and is status post AKA on 09/26/2018. She has demonstrated poor p.o. intake, oral thush, as well as " significant difficulty with secretion clearance, evolving poor cough and increased work of breathing in addition to malnutrition". She  developed encephalopathy, was unable to clear airway secretions and required intubation on 1/3 with extubation on 1/13. Nursing reported that NG tube placement was attempted four times without success.  Subjective: alert, follows commands Assessment / Plan / Recommendation CHL IP CLINICAL IMPRESSIONS 10/12/2018 Clinical Impression Pt presents with a post-extubation dysphagia characterized by aspiration of tspn-sized boluses of nectar and thin liquids during the swallow.  There is  adequate oral control, swallow onset, and pharyngeal constriction, but consistent spilling of materials into posterior larynx/trachea with no cough response elicited.  This pattern of aspiration is typical of post-extubation dysphagia, marked by probable glottal incompetence (corresponding with clinical sign of aphonia) and leading to aspiration between the posterior vocal folds without sensation.  Purees, due to their thicker nature, were not aspirated.  If necessary, PO meds may be given crushed in puree.  Pt would benefit from a cortrak if it can be successfully placed - she is not yet safe to begin a PO diet.  SLP will follow for PO readiness/diet progression.   SLP Visit Diagnosis Dysphagia, pharyngeal phase (R13.13) Attention and concentration deficit following -- Frontal lobe and executive function deficit following -- Impact on safety and function Severe aspiration risk   CHL IP TREATMENT RECOMMENDATION 10/12/2018 Treatment Recommendations Therapy as outlined in treatment plan below   Prognosis 10/12/2018 Prognosis for Safe Diet Advancement Good Barriers to Reach Goals -- Barriers/Prognosis Comment -- CHL IP DIET RECOMMENDATION 10/12/2018 SLP Diet Recommendations NPO except meds Liquid Administration via -- Medication Administration Crushed with puree Compensations -- Postural Changes --   CHL IP OTHER RECOMMENDATIONS 10/12/2018 Recommended Consults -- Oral Care Recommendations Oral care QID Other Recommendations --   CHL IP FOLLOW UP RECOMMENDATIONS 09/29/2018 Follow up Recommendations Skilled Nursing facility   [facility] IP FREQUENCY AND DURATION 10/12/2018 Speech Therapy Frequency (ACUTE ONLY) min 2x/week Treatment Duration 2 weeks      CHL IP ORAL PHASE 10/12/2018 Oral Phase WFL Oral - Pudding Teaspoon -- Oral - Pudding Cup -- Oral - Honey Teaspoon -- Oral - Honey Cup -- Oral - Nectar Teaspoon -- Oral - Nectar Cup -- Oral - Nectar Straw -- Oral - Thin Teaspoon -- Oral - Thin Cup -- Oral - Thin Straw -- Oral - Puree --  Oral - Mech Soft -- Oral - Regular -- Oral - Multi-Consistency -- Oral - Pill -- Oral Phase - Comment --  CHL IP PHARYNGEAL PHASE 10/12/2018 Pharyngeal Phase Impaired Pharyngeal- Pudding Teaspoon -- Pharyngeal -- Pharyngeal- Pudding Cup -- Pharyngeal -- Pharyngeal- Honey Teaspoon Delayed swallow initiation-vallecula;Reduced airway/laryngeal closure;Penetration/Aspiration during swallow;Moderate aspiration Pharyngeal Material enters airway, passes BELOW cords without attempt by patient to eject out (silent aspiration) Pharyngeal- Honey Cup -- Pharyngeal -- Pharyngeal- Nectar Teaspoon Delayed swallow initiation-vallecula;Reduced airway/laryngeal closure;Penetration/Aspiration during swallow;Moderate aspiration Pharyngeal Material enters airway, passes BELOW cords without attempt by patient to eject out (silent aspiration) Pharyngeal- Nectar Cup -- Pharyngeal -- Pharyngeal- Nectar Straw -- Pharyngeal -- Pharyngeal- Thin Teaspoon -- Pharyngeal -- Pharyngeal- Thin Cup -- Pharyngeal -- Pharyngeal- Thin Straw -- Pharyngeal -- Pharyngeal- Puree Delayed swallow initiation-vallecula Pharyngeal -- Pharyngeal- Mechanical Soft -- Pharyngeal -- Pharyngeal- Regular -- Pharyngeal -- Pharyngeal- Multi-consistency -- Pharyngeal -- Pharyngeal- Pill -- Pharyngeal -- Pharyngeal Comment --  No flowsheet data found. Juan Quam Laurice 10/12/2018, 5:21 PM                   Scheduled Meds: . sodium chloride   Intravenous Once  . amiodarone  200 mg Per Tube Daily  . brimonidine  1 drop Left Eye BID   And  . timolol  1 drop Left Eye BID  . calcium acetate (Phos Binder)  667 mg Oral TID WC  . Chlorhexidine Gluconate Cloth  6 each Topical Q0600  . Chlorhexidine Gluconate Cloth  6 each Topical Q0600  . darbepoetin (ARANESP) injection - DIALYSIS  200 mcg Intravenous Q Sat-HD  . heparin  1,800 Units Dialysis Once in dialysis  . hydrocortisone sodium succinate  50 mg Intravenous Q6H  . insulin aspart  0-9 Units Subcutaneous  Q4H  . ipratropium-albuterol  3 mL Nebulization BID  . latanoprost  1 drop Both Eyes QHS  . pantoprazole (PROTONIX) IV  40 mg Intravenous QHS  . sodium chloride flush  3 mL Intravenous Q12H  . vancomycin  125 mg Per Tube QID   Continuous Infusions: . sodium chloride Stopped (10/11/18 0902)  . sodium chloride    . sodium chloride    . dextrose 20 mL/hr at 10/13/18 0900  . heparin 850 Units/hr (10/13/18 0449)     LOS: 19 days    Time spent: 35 min    Marchel Foote, MD [facility] Pager on amion  If 7PM-7AM, please contact night-coverage [email or web address] Password [facility] 10/13/2018, 9:29 AM

## 2018-10-13 NOTE — Progress Notes (Addendum)
  Progress Note    10/13/2018 8:38 AM 17 Days Post-Op  Subjective:  When asked she says she has a little pain  Afebrile  Vitals:   10/13/18 0600 10/13/18 0755  BP: (!) 106/35   Pulse: 78   Resp: 16   Temp:  (!) 97.2 F (36.2 C)  SpO2: 100%     Physical Exam: Incisions:  Clean with staples in tact; small area of skin necrosis on the lateral, posterior incision.   CBC    Component Value Date/Time   WBC 16.4 (H) 10/13/2018 0231   RBC 3.31 (L) 10/13/2018 0231   HGB 9.2 (L) 10/13/2018 0231   HGB 8.4 (L) 07/17/2017 1025   HCT 30.5 (L) 10/13/2018 0231   HCT 24.9 (L) 12/23/2017 1527   PLT 252 10/13/2018 0231   MCV 92.1 10/13/2018 0231   MCH 27.8 10/13/2018 0231   MCHC 30.2 10/13/2018 0231   RDW 23.3 (H) 10/13/2018 0231   LYMPHSABS 0.5 (L) 09/24/2018 1749   MONOABS 0.6 09/24/2018 1749   EOSABS 0.0 09/24/2018 1749   BASOSABS 0.0 09/24/2018 1749    BMET    Component Value Date/Time   NA 133 (L) 10/13/2018 0231   NA 140 08/09/2018 1159   K 4.3 10/13/2018 0231   CL 96 (L) 10/13/2018 0231   CO2 23 10/13/2018 0231   GLUCOSE 191 (H) 10/13/2018 0231   BUN 23 10/13/2018 0231   BUN 67 (H) 08/09/2018 1159   CREATININE 2.26 (H) 10/13/2018 0231   CALCIUM 8.2 (L) 10/13/2018 0231   CALCIUM 7.0 (L) 03/16/2014 0835   GFRNONAA 21 (L) 10/13/2018 0231   GFRAA 24 (L) 10/13/2018 0231    INR    Component Value Date/Time   INR 1.45 10/01/2018 1409     Intake/Output Summary (Last 24 hours) at 10/13/2018 0838 Last data filed at 10/13/2018 0600 Gross per 24 hour  Intake 638.12 ml  Output 1900 ml  Net -1261.88 ml     Assessment/Plan:  73 y.o. female is s/p left above knee amputation  17 Days Post-Op  -pt's incision is healing nicely but does have a small area of skin necrosis on the posterior lateral portion of the incision.   -have asked the nurse to prop the stump up with a towel to relieve pressure. -discussions with CCM and family regarding palliative care ongoing    -f/u with Dr. Donnetta Hutching in 4 weeks    Leontine Locket, PA-C [facility] [phone] 10/13/2018 8:38 AM   I have examined the patient, reviewed and agree with above.  Curt Jews, MD 10/13/2018 2:30 PM

## 2018-10-13 NOTE — Progress Notes (Signed)
Subjective:  HD yest - removed 1700- tolerated well - stable overnight , some hypoglycemia Objective Vital signs in last 24 hours: Vitals:   10/13/18 0400 10/13/18 0500 10/13/18 0600 10/13/18 0755  BP:  (!) 105/23 (!) 106/35   Pulse:  77 78   Resp:  20 16   Temp: 97.8 F (36.6 C)   (!) 97.2 F (36.2 C)  TempSrc: Oral   Oral  SpO2:  100% 100%   Weight:      Height:       Weight change: 1.5 kg  Intake/Output Summary (Last 24 hours) at 10/13/2018 0803 Last data filed at 10/13/2018 0600 Gross per 24 hour  Intake 638.12 ml  Output 1900 ml  Net -1261.88 ml   Dialysis Orders:  [facility] TTS  3.5h 61kg 400/800 Hep 1800 R TDC Mircera 225 q2weeks (last 09/14/18)   Assessment/ Plan: Pt is a 73 y.o. yo female ESRD who was admitted on 09/24/2018 with FTT after AKA culminating with PNA and respiratory failure req vent Assessment/Plan: 1. Pulm/ID - PNA and resp failure- on meropenam - extubated- sat 100%- WBC decreasing.  Now also C diff- on vanc  2. ESRD - normally TTS- had HD yest, would next be due tomorrow via [facility] 3. Anemia- req transfusion this hosp- on max darbe- no iron right now 4. Secondary hyperparathyroidism- phoslyra ordered, phos decreasing - no recent PTH data here 5. HTN/volume- BP stable- no BP meds- volume status slightly heavy- remove volume as able with HD  6. FTT- low albumin/decub- has had palliative discussion but family requesting continued max efforts, cont with these discussions -difficult because is alert   [name]    Labs: Basic Metabolic Panel: Recent Labs  Lab 10/11/18 1503 10/12/18 0318 10/13/18 0231  NA 133* 136 133*  K 3.6 3.2* 4.3  CL 100 101 96*  CO2 19* 19* 23  GLUCOSE 172* 88 191*  BUN 81* 85* 23  CREATININE 4.20* 4.73* 2.26*  CALCIUM 8.1* 8.3* 8.2*  PHOS 6.6* 7.2* 5.2*   Liver Function Tests: Recent Labs  Lab 10/07/18 0355 10/08/18 0559  10/11/18 1503 10/12/18 0318 10/13/18 0231  AST 52* 103*  --   --  30  --   ALT  91* 74*  --   --  22  --   ALKPHOS 93 94  --   --  80  --   BILITOT 0.6 0.6  --   --  0.7  --   PROT 5.7* 5.7*  --   --  5.6*  --   ALBUMIN 1.2*  1.2* 1.4*   < > 1.4* 1.5* 1.5*   < > = values in this interval not displayed.   No results for input(s): LIPASE, AMYLASE in the last 168 hours. No results for input(s): AMMONIA in the last 168 hours. CBC: Recent Labs  Lab 10/10/18 0238 10/11/18 0409 10/11/18 1214 10/12/18 0318 10/13/18 0231  WBC 21.9* 17.3* 20.6* 17.9* 16.4*  HGB 7.7* 6.6* 8.2* 8.3* 9.2*  HCT 26.0* 22.9* 27.1* 27.6* 30.5*  MCV 91.2 94.2 91.6 91.7 92.1  PLT 254 285 261 281 252   Cardiac Enzymes: No results for input(s): CKTOTAL, CKMB, CKMBINDEX, TROPONINI in the last 168 hours. CBG: Recent Labs  Lab 10/12/18 1552 10/12/18 1949 10/13/18 0105 10/13/18 0453 10/13/18 0752  GLUCAP 161* 203* 206* 182* 189*    Iron Studies:  Recent Labs    10/12/18 0318  IRON 20*  TIBC 119*  FERRITIN 2,317*   Studies/Results:  Dg Chest Port 1 View  Result Date: 10/12/2018 CLINICAL DATA:  Acute onset shortness of breath. In stage renal disease on hemodialysis. EXAM: PORTABLE CHEST 1 VIEW COMPARISON:  10/02/2018 and earlier. FINDINGS: Cardiac silhouette markedly enlarged, unchanged. Thoracic aorta atherosclerotic, unchanged. Hilar and mediastinal contours otherwise unremarkable. Pulmonary venous hypertension without overt edema currently. Streaky and patchy opacities at the MEDIAL RIGHT lung base. Lungs otherwise clear. Small BILATERAL pleural effusions, unchanged. RIGHT jugular dialysis catheter tips project at or near the cavoatrial junction, unchanged. Interval extubation and nasogastric tube removal. IMPRESSION: 1. Atelectasis and/or bronchopneumonia at the MEDIAL RIGHT lung base. Stable small BILATERAL pleural effusions. No acute cardiopulmonary disease otherwise. 2. Stable marked cardiomegaly. Pulmonary venous hypertension without overt edema currently. Electronically Signed   By:  Evangeline Dakin M.D.   On: 10/12/2018 09:03   Dg Abd Portable 1v  Result Date: 10/11/2018 CLINICAL DATA:  NG tube placement EXAM: PORTABLE ABDOMEN - 1 VIEW COMPARISON:  10/11/2018 FINDINGS: The enteric tube previously visualized over the right heart has been repositioned and advanced. Tube tip is now in the right lower lung suggesting position in a right lower lobe bronchus. Removal and repositioning is recommended. IMPRESSION: Enteric tube tip is in the right lower lung suggesting position in a right lower lobe bronchus. Removal and repositioning is recommended. These results were called by telephone at the time of interpretation on 10/11/2018 at 11:05 pm to Dr. Corinna Lines , who verbally acknowledged these results. Electronically Signed   By: Lucienne Capers M.D.   On: 10/11/2018 23:14   Dg Abd Portable 1v  Result Date: 10/11/2018 CLINICAL DATA:  NG tube placement EXAM: PORTABLE ABDOMEN - 1 VIEW COMPARISON:  10/07/2018 FINDINGS: A coiled segment of tubing, possibly the enteric tube, is demonstrated at the edge of the image and projected over the right heart. This could represent tube placement in the right mainstem bronchus. Chest radiograph suggested for further evaluation, or could remove and reposition the tube. No tubing demonstrated in the expected location of the stomach. Scattered gas in the colon and small bowel without distention. No radiopaque stones. Degenerative changes in the spine. IMPRESSION: Enteric tube appears to be coiled in the right mainstem bronchus. Recommend chest radiograph for further evaluation or removal and repositioning of the tube. Electronically Signed   By: Lucienne Capers M.D.   On: 10/11/2018 23:02   Dg Swallowing Func-speech Pathology  Result Date: 10/12/2018 Objective Swallowing Evaluation: Type of Study: MBS-Modified Barium Swallow Study  Patient Details Name: [name] MRN: [id number] Date of Birth: 10-29-1945 Today's Date: 10/12/2018 Time: SLP Start Time  (ACUTE ONLY): 1300 -SLP Stop Time (ACUTE ONLY): 1330 SLP Time Calculation (min) (ACUTE ONLY): 30 min Past Medical History: Past Medical History: Diagnosis Date . Anemia  . Arthritis  . Asthma  . Atrial fibrillation ([place])  . Atrial flutter ([place])   a. s/p TEE/DCCV December 2013 b. 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 Bone spur of toe   Right 5th toe . Chronic diastolic CHF (congestive heart failure) (H[facility])   a. EF 50-55% by echo in 2015 . Colon polyposis  . COPD (chronic obstructive pulmonary disease) ([place])  . DVT of upper extremity (deep vein thrombosis) ([place])   Right basilic vein, June 3500 . ESRD (end stage renal disease) on dialysis ([place])   "TTF; [place]; off [employer]." (09/07/2018) . Essential  hypertension, benign  . Fractures  . Glaucoma  . Lupus ([place])  . Pericarditis  . SLE (systemic lupus erythematosus) ([place])  . Type 2 diabetes mellitus ([place])  . UTI (lower urinary tract infection)  . Wound of right leg 06/2017 Past Surgical History: Past Surgical History: Procedure Laterality Date . ABDOMINAL AORTOGRAM W/LOWER EXTREMITY N/A 12/18/2017  Procedure: ABDOMINAL AORTOGRAM W/LOWER EXTREMITY;  Surgeon: Elam Dutch, MD;  Location: [place] [facility];  Service: Cardiovascular;  Laterality: N/A;  bilateral . ABDOMINAL HYSTERECTOMY  1983 . AMPUTATION Right 06/08/2018  Procedure: RIGHT ABOVE KNEE AMPUTATION;  Surgeon: Elam Dutch, MD;  Location[facility];  Service: Vascular;  Laterality: Right; . AMPUTATION Left 09/26/2018  Procedure: AMPUTATION ABOVE KNEE;  Surgeon: Rosetta Posner, MD;  Location: [facility] OR;  Service: Vascular;  Laterality: Left;  WILL BE AN INPATIENT . ANKLE SURGERY  1993 . AV FISTULA PLACEMENT Left 03/27/2014  Procedure: ARTERIOVENOUS (AV) FISTULA CREATION;  Surgeon: Rosetta Posner, MD;  Location: [place];  Service: Vascular;   Laterality: Left; . BACK SURGERY  1980 . BASCILIC VEIN TRANSPOSITION Left 01/13/2018  Procedure: LEFT BASILIC VEIN TRANSPOSITION SECOND STAGE;  Surgeon: Elam Dutch, MD;  Location: [place];  Service: Vascular;  Laterality: Left; . CARDIOVASCULAR STRESS TEST  12/19/2009  no stress induced rhythm abnormalities, ekg negative for ischemia . CARDIOVERSION  09/16/2012  Procedure: CARDIOVERSION;  Surgeon: Sanda Klein, MD;  Location: [facility];  Service: Cardiovascular;  Laterality: N/A; . CATARACT EXTRACTION   . COLONOSCOPY  09/20/2012  Dr. [name]: multiple tubular adenomas  . COLONOSCOPY  2005  Dr. [name]. Polyps, path unknown  . COLONOSCOPY N/A 07/24/2016  Dr. [name]: 3 significant size polyps removed from the colon, tubular adenomas.  Next colonoscopy in October 2020. Marland Kitchen ESOPHAGOGASTRODUODENOSCOPY  09/20/2012  Dr. [name]: duodenal erosion and possible resolving ulcer at the angularis  . ESOPHAGOGASTRODUODENOSCOPY N/A 03/24/2018  Dr. [name]: Mild erosive reflux esophagitis, erosive gastropathy and enteropathy fairly extensive, no H. pylori on biopsy. Marland Kitchen EYE SURGERY   . IR FLUORO GUIDE CV LINE RIGHT  11/09/2017 . IR RADIOLOGIST EVAL & MGMT  09/01/2018 . IR US GUIDE VASC ACCESS RIGHT  11/09/2017 . REVISON OF ARTERIOVENOUS FISTULA Left 03/15/2018  Procedure: REVISION OF Left arm BASILIC VEIN TRANSPOSITION;  Surgeon: Angelia Mould, MD;  Location: [place];  Service: Vascular;  Laterality: Left; . TEE WITHOUT CARDIOVERSION  09/16/2012  Procedure: TRANSESOPHAGEAL ECHOCARDIOGRAM (TEE);  Surgeon: Sanda Klein, MD;  Location[facility] ENDOSCOPY;  Service: Cardiovascular;  Laterality: N/A; . TRANSESOPHAGEAL ECHOCARDIOGRAM WITH CARDIOVERSION  09/16/2012  EF 60-65%, moderate LVH, moderate regurg of the aortic valve, LA moderately dilated . TRANSMETATARSAL AMPUTATION Left 09/06/2018  Procedure: TRANSMETATARSAL AMPUTATION;  Surgeon: Angelia Mould, MD;  Location: [facility] OR;  Service: Vascular;  Laterality: Left; HPI: Pt is a 73  y.o. female admitted with progressive gangrene of her left foot and is status post AKA on 09/26/2018. She has demonstrated poor p.o. intake, oral thush, as well as " significant difficulty with secretion clearance, evolving poor cough and increased work of breathing in addition to malnutrition". She developed encephalopathy, was unable to clear airway secretions and required intubation on 1/3 with extubation on 1/13. Nursing reported that NG tube placement was attempted four times without success.  Subjective: alert, follows commands Assessment / Plan / Recommendation CHL IP CLINICAL IMPRESSIONS 10/12/2018 Clinical Impression Pt presents with a post-extubation dysphagia characterized by aspiration of tspn-sized boluses of nectar and thin liquids during the swallow.  There is adequate  oral control, swallow onset, and pharyngeal constriction, but consistent spilling of materials into posterior larynx/trachea with no cough response elicited.  This pattern of aspiration is typical of post-extubation dysphagia, marked by probable glottal incompetence (corresponding with clinical sign of aphonia) and leading to aspiration between the posterior vocal folds without sensation.  Purees, due to their thicker nature, were not aspirated.  If necessary, PO meds may be given crushed in puree.  Pt would benefit from a cortrak if it can be successfully placed - she is not yet safe to begin a PO diet.  SLP will follow for PO readiness/diet progression.   SLP Visit Diagnosis Dysphagia, pharyngeal phase (R13.13) Attention and concentration deficit following -- Frontal lobe and executive function deficit following -- Impact on safety and function Severe aspiration risk   CHL IP TREATMENT RECOMMENDATION 10/12/2018 Treatment Recommendations Therapy as outlined in treatment plan below   Prognosis 10/12/2018 Prognosis for Safe Diet Advancement Good Barriers to Reach Goals -- Barriers/Prognosis Comment -- CHL IP DIET RECOMMENDATION 10/12/2018  SLP Diet Recommendations NPO except meds Liquid Administration via -- Medication Administration Crushed with puree Compensations -- Postural Changes --   CHL IP OTHER RECOMMENDATIONS 10/12/2018 Recommended Consults -- Oral Care Recommendations Oral care QID Other Recommendations --   CHL IP FOLLOW UP RECOMMENDATIONS 09/29/2018 Follow up Recommendations Skilled Nursing facility   [facility] IP FREQUENCY AND DURATION 10/12/2018 Speech Therapy Frequency (ACUTE ONLY) min 2x/week Treatment Duration 2 weeks      CHL IP ORAL PHASE 10/12/2018 Oral Phase WFL Oral - Pudding Teaspoon -- Oral - Pudding Cup -- Oral - Honey Teaspoon -- Oral - Honey Cup -- Oral - Nectar Teaspoon -- Oral - Nectar Cup -- Oral - Nectar Straw -- Oral - Thin Teaspoon -- Oral - Thin Cup -- Oral - Thin Straw -- Oral - Puree -- Oral - Mech Soft -- Oral - Regular -- Oral - Multi-Consistency -- Oral - Pill -- Oral Phase - Comment --  CHL IP PHARYNGEAL PHASE 10/12/2018 Pharyngeal Phase Impaired Pharyngeal- Pudding Teaspoon -- Pharyngeal -- Pharyngeal- Pudding Cup -- Pharyngeal -- Pharyngeal- Honey Teaspoon Delayed swallow initiation-vallecula;Reduced airway/laryngeal closure;Penetration/Aspiration during swallow;Moderate aspiration Pharyngeal Material enters airway, passes BELOW cords without attempt by patient to eject out (silent aspiration) Pharyngeal- Honey Cup -- Pharyngeal -- Pharyngeal- Nectar Teaspoon Delayed swallow initiation-vallecula;Reduced airway/laryngeal closure;Penetration/Aspiration during swallow;Moderate aspiration Pharyngeal Material enters airway, passes BELOW cords without attempt by patient to eject out (silent aspiration) Pharyngeal- Nectar Cup -- Pharyngeal -- Pharyngeal- Nectar Straw -- Pharyngeal -- Pharyngeal- Thin Teaspoon -- Pharyngeal -- Pharyngeal- Thin Cup -- Pharyngeal -- Pharyngeal- Thin Straw -- Pharyngeal -- Pharyngeal- Puree Delayed swallow initiation-vallecula Pharyngeal -- Pharyngeal- Mechanical Soft -- Pharyngeal -- Pharyngeal-  Regular -- Pharyngeal -- Pharyngeal- Multi-consistency -- Pharyngeal -- Pharyngeal- Pill -- Pharyngeal -- Pharyngeal Comment --  No flowsheet data found. Juan Quam Laurice 10/12/2018, 5:21 PM              Medications: Infusions: . sodium chloride Stopped (10/11/18 0902)  . sodium chloride    . sodium chloride    . dextrose 20 mL/hr at 10/12/18 1800  . heparin 850 Units/hr (10/13/18 0449)    Scheduled Medications: . sodium chloride   Intravenous Once  . amiodarone  200 mg Per Tube Daily  . brimonidine  1 drop Left Eye BID   And  . timolol  1 drop Left Eye BID  . calcium acetate (Phos Binder)  667 mg Oral TID WC  . Chlorhexidine Gluconate Cloth  6  each Topical [id number]  . Chlorhexidine Gluconate Cloth  6 each Topical Q0600  . darbepoetin (ARANESP) injection - DIALYSIS  200 mcg Intravenous Q Sat-HD  . heparin  1,800 Units Dialysis Once in dialysis  . hydrocortisone sodium succinate  50 mg Intravenous Q6H  . insulin aspart  0-9 Units Subcutaneous Q4H  . ipratropium-albuterol  3 mL Nebulization BID  . latanoprost  1 drop Both Eyes QHS  . pantoprazole (PROTONIX) IV  40 mg Intravenous QHS  . sodium chloride flush  3 mL Intravenous Q12H  . vancomycin  125 mg Per Tube QID    have reviewed scheduled and prn medications.  Physical Exam: General: soft spoken- remembers me  Heart: RRR Lungs: mostly clear Abdomen: soft, non tender Extremities: min edema- tender to touch- bilat AKA Dialysis Access: right sided TDC     10/13/2018,8:03 AM  LOS: 19 days

## 2018-10-13 NOTE — Plan of Care (Signed)
  Problem: Clinical Measurements: Goal: Ability to maintain clinical measurements within normal limits will improve Outcome: Progressing Goal: Will remain free from infection Outcome: Progressing Goal: Diagnostic test results will improve Outcome: Progressing Goal: Respiratory complications will improve Outcome: Progressing Goal: Cardiovascular complication will be avoided Outcome: Progressing   Problem: Elimination: Goal: Will not experience complications related to bowel motility Outcome: Progressing   Problem: Pain Managment: Goal: General experience of comfort will improve Outcome: Progressing   Problem: Safety: Goal: Ability to remain free from injury will improve Outcome: Progressing   Problem: Skin Integrity: Goal: Risk for impaired skin integrity will decrease Outcome: Progressing   Problem: Activity: Goal: Ability to tolerate increased activity will improve Outcome: Progressing   Problem: Fluid Volume: Goal: Hemodynamic stability will improve Outcome: Progressing   Problem: Clinical Measurements: Goal: Diagnostic test results will improve Outcome: Progressing Goal: Signs and symptoms of infection will decrease Outcome: Progressing   Problem: Respiratory: Goal: Ability to maintain adequate ventilation will improve Outcome: Progressing   Problem: Nutrition: Goal: Adequate nutrition will be maintained Outcome: Not Progressing  Still NPO, no tube feeds

## 2018-10-13 NOTE — Progress Notes (Addendum)
Nutrition Consult/Follow Up  DOCUMENTATION CODES:   Not applicable  INTERVENTION:    Initiate Vital AF 1.2 at 20 ml/hr and increase by 10 ml every 12 hours to goal rate of 60 ml/hr  Provides 1728 kcals, 108 gm protein, 1167 ml free water daily   NUTRITION DIAGNOSIS:   Inadequate oral intake now related to dysphagia as evidenced by NPO status, ongoing  GOAL:   Patient will meet greater than or equal to 90% of their needs, progressing  MONITOR:   TF tolerance, Labs, Skin, Weight trends, I & O's  ASSESSMENT:    73 y.o. Female with PMH significant of HTN, DM, COPD, anemia, atrial fib and DVT on Eliquis, CHF, ESRD-HD (TTS), lupus, PAD, s/p R AKA. She presented on 12/27 with L foot pain. Patient reported having L transmetatarsal amputation on 09/06/18. There was concern for gangrene in the foot. She underwent L AKA on 12/29. Subsequent course complicated by poor p.o. intake in the setting malnutrition, oral thrush and right flank pain.   1/03 transfer to ICU and intubated 1/04 OGT with bright red blood 1/13 extubated, TF via OGT discontinued  Pt s/p MBSS 1/14; pt with post extubation dysphagia. She is not ready for PO diet; SLP recommending NPO status. Cortrak tube placed this AM per Cortrak Tube Team; tip is postpyloric.  Spoke with Marcene Brawn, Therapist, sports. Pt had some diarrhea while receiving TF during intubation period. Plan is to slowly increase TF to goal rate; no initiation of PEPuP Protocol.  Labs reviewed. Na 133 (L). P 5.2 (H). Medications reviewed. CBG's [phone].  Diet Order:   Diet Order            Diet NPO time specified  Diet effective now             EDUCATION NEEDS:   Not appropriate for education at this time  Skin:  Skin Assessment: Skin Integrity Issues: DTI: left thigh (1/6), left buttocks (1/6) Stage II: ischial tuberosity (1/6) Stage III: sacrum (12/30) Incisions: L leg for AKA (12/29)  Last BM:  1/15   Intake/Output Summary (Last 24 hours) at  10/13/2018 1201 Last data filed at 10/13/2018 0900 Gross per 24 hour  Intake 609.1 ml  Output 200 ml  Net 409.1 ml   Height:   Ht Readings from Last 1 Encounters:  09/25/18 5' (1.524 m)   Weight:   Wt Readings from Last 1 Encounters:  10/12/18 56.1 kg   Ideal Body Weight:  45.45 kg  BMI:  26.8 kg/m2 >> adjusted for AKA  Estimated Nutritional Needs:   Kcal:  1700-1900  Protein:  95-110 gm  Fluid:  per MD  Arthur Holms, RD, LDN Pager #: [phone] After-Hours Pager #: [phone]

## 2018-10-13 NOTE — Progress Notes (Signed)
  Speech Language Pathology Treatment: Dysphagia  Patient Details Name: [name] MRN: [id number] DOB: [birth year] Today's Date: 10/13/2018 Time: 1040-1050 SLP Time Calculation (min) (ACUTE ONLY): 10 min  Assessment / Plan / Recommendation Clinical Impression  Patient seen for po readiness via diagnostic treatment. Patient alert and cooperative, continues to present with severe dysphonia, largely aphonic, indicative of decreased glottal closure s/p prolonged intubation. Oral care provided to maximize safety with po intake. Ice chips provided to facilitate use of swallowing musculature. Patient able to consume with min-mod verbal cueing for use of swift effortful swallows in attempts to increase timing of swallow and glottal closure for improved airway protection. Patient with delayed cough post intake, suspect delayed sensation of deep aspiration based on MBS results. Patient not yet ready for po intake. Cortrak now in place. Recommend providing meds via Cortrak instead of with pureed solids to maximize safety.    HPI HPI: Pt is a 73 y.o. female admitted with progressive gangrene of her left foot and is status post AKA on 09/26/2018. She has demonstrated poor p.o. intake, oral thush, as well as " significant difficulty with secretion clearance, evolving poor cough and increased work of breathing in addition to malnutrition". She developed encephalopathy, was unable to clear airway secretions and required intubation on 1/3 with extubation on 1/13. Nursing reported that NG tube placement was attempted four times without success.       SLP Plan  Continue with current plan of care       Recommendations  Diet recommendations: NPO Medication Administration: Via alternative means                SLP Visit Diagnosis: Dysphagia, pharyngeal phase (R13.13) Plan: Continue with current plan of care       GO             Isaish Alemu MA, [place] 10/13/2018, 10:55  AM

## 2018-10-13 NOTE — Consult Note (Signed)
Reason for Consult:Sacral Wound Referring Physician: Dr. Lesle Chris MEYER DOCKERY is an 73 y.o. female.  HPI: Pt has a past med hx of a recent AKA on the left.  She had a previous AKA on the right.  Pt has ESRD, PVD, DM, HTN and diastolic dysfunction.  Pts husband said the sacral wound was present prior to her admission to SNF in the fall.  He said it was not very large at the time.  Nursing staff said she was on medication that kept her sedated.  Pt has had C.diff and diarrhea that may also have contributed to the progression of this wound.  Husband is at the pts bedside.   Past Medical History:  Diagnosis Date  . Anemia   . Arthritis   . Asthma   . Atrial fibrillation ([place])   . Atrial flutter ([place])    a. s/p TEE/DCCV December 2013 b. 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 Bone spur of toe    Right 5th toe  . Chronic diastolic CHF (congestive heart failure) (HCC)    a. EF 50-55% by echo in 2015  . Colon polyposis   . COPD (chronic obstructive pulmonary disease) ([place])   . DVT of upper extremity (deep vein thrombosis) ([place])    Right basilic vein, June 6270  . ESRD (end stage renal disease) on dialysis ([place])    "TTF; [place]; off [employer]." (09/07/2018)  . Essential hypertension, benign   . Fractures   . Glaucoma   . Lupus ([place])   . Pericarditis   . SLE (systemic lupus erythematosus) ([place])   . Type 2 diabetes mellitus ([place])   . UTI (lower urinary tract infection)   . Wound of right leg 06/2017    Past Surgical History:  Procedure Laterality Date  . ABDOMINAL AORTOGRAM W/LOWER EXTREMITY N/A 12/18/2017   Procedure: ABDOMINAL AORTOGRAM W/LOWER EXTREMITY;  Surgeon: Elam Dutch, MD;  Location: [place] [facility];  Service: Cardiovascular;  Laterality: N/A;  bilateral  . ABDOMINAL HYSTERECTOMY  1983   . AMPUTATION Right 06/08/2018   Procedure: RIGHT ABOVE KNEE AMPUTATION;  Surgeon: Elam Dutch, MD;  Location[facility];  Service: Vascular;  Laterality: Right;  . AMPUTATION Left 09/26/2018   Procedure: AMPUTATION ABOVE KNEE;  Surgeon: Rosetta Posner, MD;  [facility];  Service: Vascular;  Laterality: Left;  WILL BE AN INPATIENT  . ANKLE SURGERY  1993  . AV FISTULA PLACEMENT Left 03/27/2014   Procedure: ARTERIOVENOUS (AV) FISTULA CREATION;  Surgeon: Rosetta Posner, MD;  Location: [place];  Service: Vascular;  Laterality: Left;  . BACK SURGERY  1980  . BASCILIC VEIN TRANSPOSITION Left 01/13/2018   Procedure: LEFT BASILIC VEIN TRANSPOSITION SECOND STAGE;  Surgeon: Elam Dutch, MD;  Location: [place];  Service: Vascular;  Laterality: Left;  . CARDIOVASCULAR STRESS TEST  12/19/2009   no stress induced rhythm abnormalities, ekg negative for ischemia  . CARDIOVERSION  09/16/2012   Procedure: CARDIOVERSION;  Surgeon: Sanda Klein, MD;  [facility];  Service: Cardiovascular;  Laterality: N/A;  . CATARACT EXTRACTION    . COLONOSCOPY  09/20/2012   Dr. [name]: multiple tubular adenomas   . COLONOSCOPY  2005   Dr. [name]. Polyps, path unknown   . COLONOSCOPY N/A 07/24/2016   Dr. [name]: 3 significant size polyps removed from the  colon, tubular adenomas.  Next colonoscopy in October 2020.  [place] ESOPHAGOGASTRODUODENOSCOPY  09/20/2012   Dr. [name]: duodenal erosion and possible resolving ulcer at the angularis   . ESOPHAGOGASTRODUODENOSCOPY N/A 03/24/2018   Dr. [name]: Mild erosive reflux esophagitis, erosive gastropathy and enteropathy fairly extensive, no H. pylori on biopsy.  [place] Kitchen EYE SURGERY    . IR FLUORO GUIDE CV LINE RIGHT  11/09/2017  . IR RADIOLOGIST EVAL & MGMT  09/01/2018  . IR US GUIDE VASC ACCESS RIGHT  11/09/2017  . REVISON OF ARTERIOVENOUS FISTULA Left 03/15/2018   Procedure: REVISION OF Left arm BASILIC VEIN TRANSPOSITION;  Surgeon: Angelia Mould, MD;  Location: [place];   Service: Vascular;  Laterality: Left;  . TEE WITHOUT CARDIOVERSION  09/16/2012   Procedure: TRANSESOPHAGEAL ECHOCARDIOGRAM (TEE);  Surgeon: Sanda Klein, MD;  Location[facility] ENDOSCOPY;  Service: Cardiovascular;  Laterality: N/A;  . TRANSESOPHAGEAL ECHOCARDIOGRAM WITH CARDIOVERSION  09/16/2012   EF 60-65%, moderate LVH, moderate regurg of the aortic valve, LA moderately dilated  . TRANSMETATARSAL AMPUTATION Left 09/06/2018   Procedure: TRANSMETATARSAL AMPUTATION;  Surgeon: Angelia Mould, MD;  Location[facility];  Service: Vascular;  Laterality: Left;    Family History  Problem Relation Age of Onset  . Diabetes Brother   . Diabetes Father   . Hypertension Father   . Hypertension Sister   . Stroke Mother   . Diabetes Sister   . Hypertension Brother   . Colon cancer Neg Hx     Social History:  reports that she quit smoking about 6 years ago. Her smoking use included cigarettes. She has a 30.00 pack-year smoking history. She has never used smokeless tobacco. She reports that she does not drink alcohol or use drugs.  Allergies:  Allergies  Allergen Reactions  . Ace Inhibitors Cough  . Fentanyl     Fentanyl PATCH causes lethargy, somnolence, and AMS. Tolerated IV Fentanyl in small increments.    Medications: I have reviewed the patient's current medications.  Results for orders placed or performed during the hospital encounter of 09/24/18 (from the past 48 hour(s))  Glucose, capillary     Status: Abnormal   Collection Time: 10/11/18  4:16 PM  Result Value Ref Range   Glucose-Capillary 157 (H) 70 - 99 mg/dL  Glucose, capillary     Status: Abnormal   Collection Time: 10/11/18  9:28 PM  Result Value Ref Range   Glucose-Capillary 59 (L) 70 - 99 mg/dL  Glucose, capillary     Status: Abnormal   Collection Time: 10/11/18 10:21 PM  Result Value Ref Range   Glucose-Capillary 116 (H) 70 - 99 mg/dL  Glucose, capillary     Status: Abnormal   Collection Time: 10/11/18 11:24 PM   Result Value Ref Range   Glucose-Capillary 123 (H) 70 - 99 mg/dL  CBC     Status: Abnormal   Collection Time: 10/12/18  3:18 AM  Result Value Ref Range   WBC 17.9 (H) 4.0 - 10.5 K/uL   RBC 3.01 (L) 3.87 - 5.11 MIL/uL   Hemoglobin 8.3 (L) 12.0 - 15.0 g/dL   HCT 27.6 (L) 36.0 - 46.0 %   MCV 91.7 80.0 - 100.0 fL   MCH 27.6 26.0 - 34.0 pg   MCHC 30.1 30.0 - 36.0 g/dL   RDW 22.3 (H) 11.5 - 15.5 %   Platelets 281 150 - 400 K/uL   nRBC 0.2 0.0 - 0.2 %    Comment: Performed at [facility], [address], [place], [place] [address]  APTT     Status: Abnormal   Collection Time: 10/12/18  3:18 AM  Result Value Ref Range   aPTT 67 (H) 24 - 36 seconds    Comment:        IF BASELINE aPTT IS ELEVATED, SUGGEST PATIENT RISK ASSESSMENT BE USED TO DETERMINE APPROPRIATE ANTICOAGULANT THERAPY. Performed at [facility], [place] [address], [place], [place] [address]   Heparin level (unfractionated)     Status: Abnormal   Collection Time: 10/12/18  3:18 AM  Result Value Ref Range   Heparin Unfractionated 0.28 (L) 0.30 - 0.70 IU/mL    Comment: (NOTE) If heparin results are below expected values, and patient dosage has  been confirmed, suggest follow up testing of antithrombin III levels. Performed at [facility], [place] [address], [place], [place] [address]   Magnesium     Status: None   Collection Time: 10/12/18  3:18 AM  Result Value Ref Range   Magnesium 2.0 1.7 - 2.4 mg/dL    Comment: Performed at [place] [address], [place], [place] [address]  Phosphorus     Status: Abnormal   Collection Time: 10/12/18  3:18 AM  Result Value Ref Range   Phosphorus 7.2 (H) 2.5 - 4.6 mg/dL    Comment: Performed at [place] [facility] [address] [place], [place] [address]  Iron and TIBC     Status: Abnormal   Collection Time: 10/12/18  3:18 AM  Result Value Ref Range   Iron 20 (L) 28 - 170 ug/dL   TIBC 119 (L) 250 - 450 ug/dL   Saturation Ratios 17 10.4 - 31.8 %    UIBC 99 ug/dL    Comment: Performed at [facility], [place] [address] [place], [place] [address]  Ferritin     Status: Abnormal   Collection Time: 10/12/18  3:18 AM  Result Value Ref Range   Ferritin 2,317 (H) 11 - 307 ng/mL    Comment: Performed at [facility], [place] [address], [place],  [address]  Comprehensive metabolic panel     Status: Abnormal   Collection Time: 10/12/18  3:18 AM  Result Value Ref Range   Sodium 136 135 - 145 mmol/L   Potassium 3.2 (L) 3.5 - 5.1 mmol/L   [place] 101 98 - 111 mmol/L   CO2 19 (L) 22 - 32 mmol/L   Glucose, Bld 88 70 - 99 mg/dL   BUN 85 (H) 8 - 23 mg/dL   Creatinine, Ser 4.73 (H) 0.44 - 1.00 mg/dL   Calcium 8.3 (L) 8.9 - 10.3 mg/dL   Total Protein 5.6 (L) 6.5 - 8.1 g/dL   Albumin 1.5 (L) 3.5 - 5.0 g/dL   AST 30 15 - 41 U/L   ALT 22 0 - 44 U/L   Alkaline Phosphatase 80 38 - 126 U/L   Total Bilirubin 0.7 0.3 - 1.2 mg/dL   GFR calc non Af Amer 9 (L) >60 mL/min   GFR calc Af Amer 10 (L) >60 mL/min   Anion gap 16 (H) 5 - 15    Comment: Performed at [facility], [place] [address] [place], [place] [address]  Glucose, capillary     Status: Abnormal   Collection Time: 10/12/18  4:25 AM  Result Value Ref Range   Glucose-Capillary 64 (L) 70 - 99 mg/dL  Glucose, capillary     Status: Abnormal   Collection Time:  10/12/18  5:25 AM  Result Value Ref Range   Glucose-Capillary 50 (L) 70 - 99 mg/dL  Glucose, capillary     Status: Abnormal   Collection Time: 10/12/18  6:48 AM  Result Value Ref Range   Glucose-Capillary 218 (H) 70 - 99 mg/dL  Glucose, capillary     Status: Abnormal   Collection Time: 10/12/18  7:45 AM  Result Value Ref Range   Glucose-Capillary 151 (H) 70 - 99 mg/dL  Glucose, capillary     Status: None   Collection Time: 10/12/18 11:54 AM  Result Value Ref Range   Glucose-Capillary 70 70 - 99 mg/dL  Heparin level (unfractionated)     Status: Abnormal   Collection Time: 10/12/18  1:36 PM  Result Value Ref Range    Heparin Unfractionated 0.90 (H) 0.30 - 0.70 IU/mL    Comment: (NOTE) If heparin results are below expected values, and patient dosage has  been confirmed, suggest follow up testing of antithrombin III levels. Performed at [facility], [place] [address], [place], [place] [address]   Glucose, capillary     Status: Abnormal   Collection Time: 10/12/18  3:52 PM  Result Value Ref Range   Glucose-Capillary 161 (H) 70 - 99 mg/dL  Glucose, capillary     Status: Abnormal   Collection Time: 10/12/18  7:49 PM  Result Value Ref Range   Glucose-Capillary 203 (H) 70 - 99 mg/dL  Glucose, capillary     Status: Abnormal   Collection Time: 10/13/18  1:05 AM  Result Value Ref Range   Glucose-Capillary 206 (H) 70 - 99 mg/dL  Renal function panel     Status: Abnormal   Collection Time: 10/13/18  2:31 AM  Result Value Ref Range   Sodium 133 (L) 135 - 145 mmol/L   Potassium 4.3 3.5 - 5.1 mmol/L    Comment: DELTA CHECK NOTED   [place] 96 (L) 98 - 111 mmol/L   CO2 23 22 - 32 mmol/L   Glucose, Bld 191 (H) 70 - 99 mg/dL   BUN 23 8 - 23 mg/dL   Creatinine, Ser 2.26 (H) 0.44 - 1.00 mg/dL   Calcium 8.2 (L) 8.9 - 10.3 mg/dL   Phosphorus 5.2 (H) 2.5 - 4.6 mg/dL   Albumin 1.5 (L) 3.5 - 5.0 g/dL   GFR calc non Af Amer 21 (L) >60 mL/min   GFR calc Af Amer 24 (L) >60 mL/min   Anion gap 14 5 - 15    Comment: Performed at [facility], [place] [address] [place], [place] [address]  CBC     Status: Abnormal   Collection Time: 10/13/18  2:31 AM  Result Value Ref Range   WBC 16.4 (H) 4.0 - 10.5 K/uL   RBC 3.31 (L) 3.87 - 5.11 MIL/uL   Hemoglobin 9.2 (L) 12.0 - 15.0 g/dL   HCT 30.5 (L) 36.0 - 46.0 %   MCV 92.1 80.0 - 100.0 fL   MCH 27.8 26.0 - 34.0 pg   MCHC 30.2 30.0 - 36.0 g/dL   RDW 23.3 (H) 11.5 - 15.5 %   Platelets 252 150 - 400 K/uL   nRBC 0.1 0.0 - 0.2 %    Comment: Performed at [facility], [place] [address], [place], [place] [address]  Heparin level (unfractionated)     Status:  Abnormal   Collection Time: 10/13/18  2:31 AM  Result Value Ref Range   Heparin Unfractionated 0.25 (L) 0.30 - 0.70 IU/mL  Comment: (NOTE) If heparin results are below expected values, and patient dosage has  been confirmed, suggest follow up testing of antithrombin III levels. Performed at [facility], [place] [address], [place], [place] [address]   Magnesium     Status: None   Collection Time: 10/13/18  2:31 AM  Result Value Ref Range   Magnesium 1.9 1.7 - 2.4 mg/dL    Comment: Performed at [facility], [place] [address] [place], [place] [address]  Glucose, capillary     Status: Abnormal   Collection Time: 10/13/18  4:53 AM  Result Value Ref Range   Glucose-Capillary 182 (H) 70 - 99 mg/dL  Glucose, capillary     Status: Abnormal   Collection Time: 10/13/18  7:52 AM  Result Value Ref Range   Glucose-Capillary 189 (H) 70 - 99 mg/dL  Glucose, capillary     Status: Abnormal   Collection Time: 10/13/18 11:21 AM  Result Value Ref Range   Glucose-Capillary 194 (H) 70 - 99 mg/dL  Heparin level (unfractionated)     Status: None   Collection Time: 10/13/18  1:56 PM  Result Value Ref Range   Heparin Unfractionated 0.59 0.30 - 0.70 IU/mL    Comment: (NOTE) If heparin results are below expected values, and patient dosage has  been confirmed, suggest follow up testing of antithrombin III levels. Performed at [facility], [place] [address], [place], [place] [address]     Dg Chest Port 1 View  Result Date: 10/12/2018 CLINICAL DATA:  Acute onset shortness of breath. In stage renal disease on hemodialysis. EXAM: PORTABLE CHEST 1 VIEW COMPARISON:  10/02/2018 and earlier. FINDINGS: Cardiac silhouette markedly enlarged, unchanged. Thoracic aorta atherosclerotic, unchanged. Hilar and mediastinal contours otherwise unremarkable. Pulmonary venous hypertension without overt edema currently. Streaky and patchy opacities at the MEDIAL RIGHT lung base. Lungs otherwise clear. Small  BILATERAL pleural effusions, unchanged. RIGHT jugular dialysis catheter tips project at or near the cavoatrial junction, unchanged. Interval extubation and nasogastric tube removal. IMPRESSION: 1. Atelectasis and/or bronchopneumonia at the MEDIAL RIGHT lung base. Stable small BILATERAL pleural effusions. No acute cardiopulmonary disease otherwise. 2. Stable marked cardiomegaly. Pulmonary venous hypertension without overt edema currently. Electronically Signed   By: Evangeline Dakin M.D.   On: 10/12/2018 09:03   Dg Abd Portable 1v  Result Date: 10/11/2018 CLINICAL DATA:  NG tube placement EXAM: PORTABLE ABDOMEN - 1 VIEW COMPARISON:  10/11/2018 FINDINGS: The enteric tube previously visualized over the right heart has been repositioned and advanced. Tube tip is now in the right lower lung suggesting position in a right lower lobe bronchus. Removal and repositioning is recommended. IMPRESSION: Enteric tube tip is in the right lower lung suggesting position in a right lower lobe bronchus. Removal and repositioning is recommended. These results were called by telephone at the time of interpretation on 10/11/2018 at 11:05 pm to Dr. Corinna Lines , who verbally acknowledged these results. Electronically Signed   By: Lucienne Capers M.D.   On: 10/11/2018 23:14   Dg Abd Portable 1v  Result Date: 10/11/2018 CLINICAL DATA:  NG tube placement EXAM: PORTABLE ABDOMEN - 1 VIEW COMPARISON:  10/07/2018 FINDINGS: A coiled segment of tubing, possibly the enteric tube, is demonstrated at the edge of the image and projected over the right heart. This could represent tube placement in the right mainstem bronchus. Chest radiograph suggested for further evaluation, or could remove and reposition the tube. No tubing demonstrated in the expected location of the stomach. Scattered gas  in the colon and small bowel without distention. No radiopaque stones. Degenerative changes in the spine. IMPRESSION: Enteric tube appears to be coiled in the  right mainstem bronchus. Recommend chest radiograph for further evaluation or removal and repositioning of the tube. Electronically Signed   By: Lucienne Capers M.D.   On: 10/11/2018 23:02   Dg Swallowing Func-speech Pathology  Result Date: 10/12/2018 Objective Swallowing Evaluation: Type of Study: MBS-Modified Barium Swallow Study  Patient Details Name: [name] MRN: [id number] Date of Birth: [birth year] Today's Date: 10/12/2018 Time: SLP Start Time (ACUTE ONLY): 1300 -SLP Stop Time (ACUTE ONLY): 1330 SLP Time Calculation (min) (ACUTE ONLY): 30 min Past Medical History: Past Medical History: Diagnosis Date . Anemia  . Arthritis  . Asthma  . Atrial fibrillation ([place])  . Atrial flutter ([place])   a. s/p TEE/DCCV December 2013 b. 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 Bone spur of toe   Right 5th toe . Chronic diastolic CHF (congestive heart failure) (HCC)   a. EF 50-55% by echo in 2015 . Colon polyposis  . COPD (chronic obstructive pulmonary disease) ([place])  . DVT of upper extremity (deep vein thrombosis) ([place])   Right basilic vein, June 4098 . ESRD (end stage renal disease) on dialysis ([place])   "TTF; [place]; off [employer]." (09/07/2018) . Essential hypertension, benign  . Fractures  . Glaucoma  . Lupus ([place])  . Pericarditis  . SLE (systemic lupus erythematosus) ([place])  . Type 2 diabetes mellitus ([place])  . UTI (lower urinary tract infection)  . Wound of right leg 06/2017 Past Surgical History: Past Surgical History: Procedure Laterality Date . ABDOMINAL AORTOGRAM W/LOWER EXTREMITY N/A 12/18/2017  Procedure: ABDOMINAL AORTOGRAM W/LOWER EXTREMITY;  Surgeon: Elam Dutch, MD;  Location: [place] [facility];  Service: Cardiovascular;  Laterality: N/A;  bilateral . ABDOMINAL HYSTERECTOMY  1983 . AMPUTATION Right 06/08/2018  Procedure: RIGHT  ABOVE KNEE AMPUTATION;  Surgeon: Elam Dutch, MD;  Location: [facility] OR;  Service: Vascular;  Laterality: Right; . AMPUTATION Left 09/26/2018  Procedure: AMPUTATION ABOVE KNEE;  Surgeon: Rosetta Posner, MD;  [facility];  Service: Vascular;  Laterality: Left;  WILL BE AN INPATIENT . ANKLE SURGERY  1993 . AV FISTULA PLACEMENT Left 03/27/2014  Procedure: ARTERIOVENOUS (AV) FISTULA CREATION;  Surgeon: Rosetta Posner, MD;  Location: [facility];  Service: Vascular;  Laterality: Left; . BACK SURGERY  1980 . BASCILIC VEIN TRANSPOSITION Left 01/13/2018  Procedure: LEFT BASILIC VEIN TRANSPOSITION SECOND STAGE;  Surgeon: Elam Dutch, MD;  Location: [facility];  Service: Vascular;  Laterality: Left; . CARDIOVASCULAR STRESS TEST  12/19/2009  no stress induced rhythm abnormalities, ekg negative for ischemia . CARDIOVERSION  09/16/2012  Procedure: CARDIOVERSION;  Surgeon: Sanda Klein, MD;  [facility];  Service: Cardiovascular;  Laterality: N/A; . CATARACT EXTRACTION   . COLONOSCOPY  09/20/2012  Dr. [name]: multiple tubular adenomas  . COLONOSCOPY  2005  Dr. [name]. Polyps, path unknown  . COLONOSCOPY N/A 07/24/2016  Dr. [name]: 3 significant size polyps removed from the colon, tubular adenomas.  Next colonoscopy in October 2020. [place] Kitchen ESOPHAGOGASTRODUODENOSCOPY  09/20/2012  Dr. [name]: duodenal erosion and possible resolving ulcer at the angularis  . ESOPHAGOGASTRODUODENOSCOPY N/A 03/24/2018  Dr. [name]: Mild erosive reflux esophagitis, erosive gastropathy and enteropathy fairly extensive, no H. pylori on biopsy. [place] Kitchen EYE SURGERY   .  IR FLUORO GUIDE CV LINE RIGHT  11/09/2017 . IR RADIOLOGIST EVAL & MGMT  09/01/2018 . IR US GUIDE VASC ACCESS RIGHT  11/09/2017 . REVISON OF ARTERIOVENOUS FISTULA Left 03/15/2018  Procedure: REVISION OF Left arm BASILIC VEIN TRANSPOSITION;  Surgeon: Angelia Mould, MD;  Location: [facility];  Service: Vascular;  Laterality: Left; . TEE WITHOUT CARDIOVERSION  09/16/2012  Procedure: TRANSESOPHAGEAL  ECHOCARDIOGRAM (TEE);  Surgeon: Sanda Klein, MD;  Location: [facility]OSCOPY;  Service: Cardiovascular;  Laterality: N/A; . TRANSESOPHAGEAL ECHOCARDIOGRAM WITH CARDIOVERSION  09/16/2012  EF 60-65%, moderate LVH, moderate regurg of the aortic valve, LA moderately dilated . TRANSMETATARSAL AMPUTATION Left 09/06/2018  Procedure: TRANSMETATARSAL AMPUTATION;  Surgeon: Angelia Mould, MD;  Location: [facility] OR;  Service: Vascular;  Laterality: Left; HPI: Pt is a 73 y.o. female admitted with progressive gangrene of her left foot and is status post AKA on 09/26/2018. She has demonstrated poor p.o. intake, oral thush, as well as " significant difficulty with secretion clearance, evolving poor cough and increased work of breathing in addition to malnutrition". She developed encephalopathy, was unable to clear airway secretions and required intubation on 1/3 with extubation on 1/13. Nursing reported that NG tube placement was attempted four times without success.  Subjective: alert, follows commands Assessment / Plan / Recommendation CHL IP CLINICAL IMPRESSIONS 10/12/2018 Clinical Impression Pt presents with a post-extubation dysphagia characterized by aspiration of tspn-sized boluses of nectar and thin liquids during the swallow.  There is adequate oral control, swallow onset, and pharyngeal constriction, but consistent spilling of materials into posterior larynx/trachea with no cough response elicited.  This pattern of aspiration is typical of post-extubation dysphagia, marked by probable glottal incompetence (corresponding with clinical sign of aphonia) and leading to aspiration between the posterior vocal folds without sensation.  Purees, due to their thicker nature, were not aspirated.  If necessary, PO meds may be given crushed in puree.  Pt would benefit from a cortrak if it can be successfully placed - she is not yet safe to begin a PO diet.  SLP will follow for PO readiness/diet progression.   SLP Visit Diagnosis  Dysphagia, pharyngeal phase (R13.13) Attention and concentration deficit following -- Frontal lobe and executive function deficit following -- Impact on safety and function Severe aspiration risk   CHL IP TREATMENT RECOMMENDATION 10/12/2018 Treatment Recommendations Therapy as outlined in treatment plan below   Prognosis 10/12/2018 Prognosis for Safe Diet Advancement Good Barriers to Reach Goals -- Barriers/Prognosis Comment -- CHL IP DIET RECOMMENDATION 10/12/2018 SLP Diet Recommendations NPO except meds Liquid Administration via -- Medication Administration Crushed with puree Compensations -- Postural Changes --   CHL IP OTHER RECOMMENDATIONS 10/12/2018 Recommended Consults -- Oral Care Recommendations Oral care QID Other Recommendations --   CHL IP FOLLOW UP RECOMMENDATIONS 09/29/2018 Follow up Recommendations Skilled Nursing facility   [facility] IP FREQUENCY AND DURATION 10/12/2018 Speech Therapy Frequency (ACUTE ONLY) min 2x/week Treatment Duration 2 weeks      CHL IP ORAL PHASE 10/12/2018 Oral Phase WFL Oral - Pudding Teaspoon -- Oral - Pudding Cup -- Oral - Honey Teaspoon -- Oral - Honey Cup -- Oral - Nectar Teaspoon -- Oral - Nectar Cup -- Oral - Nectar Straw -- Oral - Thin Teaspoon -- Oral - Thin Cup -- Oral - Thin Straw -- Oral - Puree -- Oral - Mech Soft -- Oral - Regular -- Oral - Multi-Consistency -- Oral - Pill -- Oral Phase - Comment --  CHL IP PHARYNGEAL PHASE 10/12/2018 Pharyngeal Phase Impaired Pharyngeal- Pudding  Teaspoon -- Pharyngeal -- Pharyngeal- Pudding Cup -- Pharyngeal -- Pharyngeal- Honey Teaspoon Delayed swallow initiation-vallecula;Reduced airway/laryngeal closure;Penetration/Aspiration during swallow;Moderate aspiration Pharyngeal Material enters airway, passes BELOW cords without attempt by patient to eject out (silent aspiration) Pharyngeal- Honey Cup -- Pharyngeal -- Pharyngeal- Nectar Teaspoon Delayed swallow initiation-vallecula;Reduced airway/laryngeal closure;Penetration/Aspiration during  swallow;Moderate aspiration Pharyngeal Material enters airway, passes BELOW cords without attempt by patient to eject out (silent aspiration) Pharyngeal- Nectar Cup -- Pharyngeal -- Pharyngeal- Nectar Straw -- Pharyngeal -- Pharyngeal- Thin Teaspoon -- Pharyngeal -- Pharyngeal- Thin Cup -- Pharyngeal -- Pharyngeal- Thin Straw -- Pharyngeal -- Pharyngeal- Puree Delayed swallow initiation-vallecula Pharyngeal -- Pharyngeal- Mechanical Soft -- Pharyngeal -- Pharyngeal- Regular -- Pharyngeal -- Pharyngeal- Multi-consistency -- Pharyngeal -- Pharyngeal- Pill -- Pharyngeal -- Pharyngeal Comment --  No flowsheet data found. Juan Quam Laurice 10/12/2018, 5:21 PM               ROS Blood pressure (!) 131/32, pulse 79, temperature 97.8 F (36.6 C), temperature source Oral, resp. rate (!) 21, height 5' (1.524 m), weight 56.1 kg, SpO2 100 %. Physical Exam Frail appearing female, she is alert and understands questions enough to nod and answer. Bilateral AKA noted, staples still in place on the left stump, Sacral wound visualized, 11 x 11 wound, picture taken for Epic  Assessment/Plan: Will discuss pt with Dr. Marla Roe to decide if Plastic Surgery has a role with this pt We do recommend Good nutrition be utilized to promote healing We also recommend air mattress and regular turning of the pt.  Melida Gimenez, [facility] Plastic Surgery [phone] 10/13/2018, 4:09 PM

## 2018-10-13 NOTE — Progress Notes (Signed)
[place] for Heparin (Apixaban on hold) Indication: atrial fibrillation / DVT  Allergies  Allergen Reactions  . Ace Inhibitors Cough  . Fentanyl     Fentanyl PATCH causes lethargy, somnolence, and AMS. Tolerated IV Fentanyl in small increments.    Patient Measurements: TBW 60.8 kg Heparin Dosing Weight: TBW  Vital Signs: Temp: 97.7 F (36.5 C) (01/15 0000) Temp Source: Oral (01/15 0000) BP: 122/22 (01/15 0200) Pulse Rate: 76 (01/15 0200)  Labs: Recent Labs    10/11/18 0409 10/11/18 1214 10/11/18 1503 10/12/18 0318 10/12/18 1336 10/13/18 0231  HGB 6.6* 8.2*  --  8.3*  --  9.2*  HCT 22.9* 27.1*  --  27.6*  --  30.5*  PLT 285 261  --  281  --  252  APTT 99*  --   --  67*  --   --   HEPARINUNFRC 0.34  --   --  0.28* 0.90* 0.25*  CREATININE 3.95*  --  4.20* 4.73*  --   --     Estimated Creatinine Clearance: 8.4 mL/min (A) (by C-G formula based on SCr of 4.73 mg/dL (H)).  Assessment: 73 yo F presents with wound infection. On Eliquis PTA for DVT and Afib- last dose was 1/4 at 1000.   Eliquis held due to suspicion for GIB.  Pharmacy asked to restart anticoagulation with IV heparin.  1/15 AM update: heparin level just below goal this AM, no issues per RN.   Goal of Therapy:  Heparin level 0.3-0.7 units/ml Monitor platelets by anticoagulation protocol: Yes   Plan:  Inc heparin to 850 units/hr Re-check heparin level in 8 hours  Narda Bonds, PharmD, [place] Pharmacist Phone: [phone]

## 2018-10-13 NOTE — Procedures (Signed)
Cortrak  Person Inserting Tube:  Marylynne Keelin, RD Tube Type:  Cortrak - 43 inches Tube Location:  Right nare Initial Placement:  Postpyloric Secured by: Bridle Technique Used to Measure Tube Placement:  Documented cm marking at nare/ corner of mouth Cortrak Secured At:  75 cm   No x-ray is required. RN may begin using tube.   If the tube becomes dislodged please keep the tube and contact the Cortrak team at [email or web address] (password [email or web address]) for replacement.  If after hours and replacement cannot be delayed, place a NG tube and confirm placement with an abdominal x-ray.    Mariana Single RD, LDN Clinical Nutrition Pager # [phone]

## 2018-10-13 NOTE — Progress Notes (Signed)
Patient hasn't had any stools today. TF titrated back to goal rate at 1600. Rectal tube very uncomfortable for patient so will remove at his point and continue to monitor closely.

## 2018-10-13 NOTE — Progress Notes (Signed)
[place] for Heparin (Apixaban on hold) Indication: atrial fibrillation / DVT  Allergies  Allergen Reactions  . Ace Inhibitors Cough  . Fentanyl     Fentanyl PATCH causes lethargy, somnolence, and AMS. Tolerated IV Fentanyl in small increments.    Patient Measurements: TBW 60.8 kg Heparin Dosing Weight: TBW  Vital Signs: Temp: 97.8 F (36.6 C) (01/15 1124) Temp Source: Oral (01/15 1124) BP: 131/32 (01/15 1300) Pulse Rate: 79 (01/15 1300)  Labs: Recent Labs    10/11/18 0409 10/11/18 1214 10/11/18 1503 10/12/18 0318 10/12/18 1336 10/13/18 0231 10/13/18 1356  HGB 6.6* 8.2*  --  8.3*  --  9.2*  --   HCT 22.9* 27.1*  --  27.6*  --  30.5*  --   PLT 285 261  --  281  --  252  --   APTT 99*  --   --  67*  --   --   --   HEPARINUNFRC 0.34  --   --  0.28* 0.90* 0.25* 0.59  CREATININE 3.95*  --  4.20* 4.73*  --  2.26*  --     Estimated Creatinine Clearance: 17.7 mL/min (A) (by C-G formula based on SCr of 2.26 mg/dL (H)).  Assessment: 73 yo F presents with wound infection. On Eliquis PTA for DVT and Afib- last dose was 1/4 at 1000.   Eliquis held due to suspicion for GIB.  Pharmacy asked to restart anticoagulation with IV heparin.  Heparin level this afternoon at goal.  No overt bleeding or complications noted.   Goal of Therapy:  Heparin level 0.3-0.7 units/ml Monitor platelets by anticoagulation protocol: Yes   Plan:  Continue IV heparin at current rate of 850 units/hr. Daily heparin level and CBC. F/u ability to resume enteral anticoagulation eventually. (plans for HD access?)  Marguerite Olea, [place] Pharmacist Phone [phone]  10/13/2018 2:32 PM

## 2018-10-13 NOTE — Progress Notes (Signed)
Physical Therapy Treatment Patient Details Name: [name] MRN: [id number] DOB: 1946/08/24 Today's Date: 10/13/2018    History of Present Illness [name] is a 73 y.o. female with medical history significant of hypertension, hyperlipidemia, diabetes mellitus, COPD, GERD, gout, and anemia, atrial fibrillation and DVT on Eliquis, ESRD-HD (TTS), lupus, PAD, R AKA (05/2018), now s/p L AKA. Pt with AMS and lethargy 10/01/18 transferred to ICU and intubated.     PT Comments    Pt admitted with above diagnosis. Pt currently with functional limitations due to balance and endurance deficits. Pt was able to sit EOB x 5 min with varying assist from min to total assist at times as pt fatigues quickly and has difficulty maintaining postural stability.  Will continue to follow acutely.  Pt will benefit from skilled PT to increase their independence and safety with mobility to allow discharge to the venue listed below.    Follow Up Recommendations  SNF;LTACH     Equipment Recommendations  Other (comment)(defer)    Recommendations for Other Services Rehab consult     Precautions / Restrictions Precautions Precautions: Fall Precaution Comments:  flexiseal Restrictions Other Position/Activity Restrictions: pt s/p B AKA    Mobility  Bed Mobility Overal bed mobility: Needs Assistance Bed Mobility: Rolling;Supine to Sit;Sit to Supine Rolling: +2 for physical assistance;Max assist;Total assist   Supine to sit: +2 for physical assistance;Total assist;Max assist Sit to supine: +2 for physical assistance;Total assist   General bed mobility comments: Pt attempted to lean forward, used pad to bring pt to EOB.   Transfers                    Ambulation/Gait                 Stairs             Wheelchair Mobility    Modified Rankin (Stroke Patients Only)       Balance Overall balance assessment: Needs assistance Sitting-balance support: Bilateral upper  extremity supported Sitting balance-Leahy Scale: Poor Sitting balance - Comments: posterior lean, brought chair in front of pt and pt held onto chair and was able to sit EOB 5 min with varying assist min to total assist.  Pt demonstrated ability to lean forward with max facilitation and cues for anterior tilt and upper back extension.  Pt fatigues quickly.   Postural control: Posterior lean                                  Cognition Arousal/Alertness: Awake/alert Behavior During Therapy: Flat affect Overall Cognitive Status: Difficult to assess Area of Impairment: Attention;Following commands;Awareness                   Current Attention Level: Sustained   Following Commands: Follows one step commands with increased time   Awareness: Emergent          Exercises Amputee Exercises Hip ABduction/ADduction: AROM;Both;5 reps;Supine Hip Flexion/Marching: AAROM;Both;10 reps;Supine    General Comments General comments (skin integrity, edema, etc.): VSS throughout      Pertinent Vitals/Pain Pain Assessment: Faces Faces Pain Scale: Hurts even more Pain Location: buttocks Pain Descriptors / Indicators: Grimacing Pain Intervention(s): Limited activity within patient's tolerance;Monitored during session;Repositioned    Home Living                      Prior Function  PT Goals (current goals can now be found in the care plan section) Acute Rehab PT Goals Patient Stated Goal: pt unable to state, but agreeable to therapy  Progress towards PT goals: Progressing toward goals    Frequency    Min 2X/week      PT Plan Current plan remains appropriate    Co-evaluation              AM-PAC PT "6 Clicks" Mobility   Outcome Measure  Help needed turning from your back to your side while in a flat bed without using bedrails?: Total Help needed moving from lying on your back to sitting on the side of a flat bed without using bedrails?:  Total Help needed moving to and from a bed to a chair (including a wheelchair)?: Total Help needed standing up from a chair using your arms (e.g., wheelchair or bedside chair)?: Total Help needed to walk in hospital room?: Total Help needed climbing 3-5 steps with a railing? : Total 6 Click Score: 6    End of Session Equipment Utilized During Treatment: Gait belt Activity Tolerance: Patient limited by fatigue Patient left: in bed;with call bell/phone within reach Nurse Communication: Mobility status;Need for lift equipment PT Visit Diagnosis: Other abnormalities of gait and mobility (R26.89);Muscle weakness (generalized) (M62.81);Difficulty in walking, not elsewhere classified (R26.2);Pain Pain - Right/Left: Left Pain - part of body: Leg     Time: 0762-2633 PT Time Calculation (min) (ACUTE ONLY): 24 min  Charges:  $Therapeutic Exercise: 8-22 mins $Therapeutic Activity: 8-22 mins                     [place] Pager:  [phone]  Office:  [place] 10/13/2018, 12:21 PM

## 2018-10-13 NOTE — Progress Notes (Signed)
Occupational Therapy Treatment Patient Details Name: [name] MRN: [id number] DOB: May 25, 1946 Today's Date: 10/13/2018    History of present illness [name] is a 73 y.o. female with medical history significant of hypertension, hyperlipidemia, diabetes mellitus, COPD, GERD, gout, and anemia, atrial fibrillation and DVT on Eliquis, ESRD-HD (TTS), lupus, PAD, R AKA (05/2018), now s/p L AKA. Pt with AMS and lethargy 10/01/18 transferred to ICU and intubated.    OT comments  Focus of session on bed level UE exercise and grooming activities. Pt able to talk with low volume, having difficulty with memory. Demonstrating improved bed mobility for pressure relief and functional UE use. Continue to recommend SNF.  Follow Up Recommendations  SNF;Supervision/Assistance - 24 hour    Equipment Recommendations       Recommendations for Other Services      Precautions / Restrictions Precautions Precautions: Fall Precaution Comments:  flexiseal, enteric       Mobility Bed Mobility Overal bed mobility: Needs Assistance Bed Mobility: Rolling Rolling: Mod assist         General bed mobility comments: rolled with use of rail for pressure relief  Transfers                 General transfer comment: deferred    Balance                                           ADL either performed or assessed with clinical judgement   ADL Overall ADL's : Needs assistance/impaired     Grooming: Brushing hair;Wash/dry face;Sitting;Minimal assistance                                       Vision       Perception     Praxis      Cognition Arousal/Alertness: Awake/alert Behavior During Therapy: WFL for tasks assessed/performed Overall Cognitive Status: Impaired/Different from baseline Area of Impairment: Memory                     Memory: Decreased short-term memory                  Exercises General Exercises - Upper  Extremity Shoulder Flexion: AROM;Both;10 reps;Supine Elbow Flexion: AROM;Both;10 reps;Supine Digit Composite Flexion: Both;10 reps;AROM;Supine Composite Extension: AROM;Both;10 reps;Supine   Shoulder Instructions       General Comments      Pertinent Vitals/ Pain       Pain Assessment: Faces Faces Pain Scale: Hurts even more Pain Location: buttocks Pain Descriptors / Indicators: Grimacing Pain Intervention(s): Monitored during session;Repositioned  Home Living                                          Prior Functioning/Environment              Frequency  Min 1X/week        Progress Toward Goals  OT Goals(current goals can now be found in the care plan section)  Progress towards OT goals: Progressing toward goals  Acute Rehab OT Goals Patient Stated Goal: to live OT Goal Formulation: With patient Time For Goal Achievement: 10/20/18 Potential to Achieve Goals: [place] Discharge  plan remains appropriate    Co-evaluation                 AM-PAC OT "6 Clicks" Daily Activity     Outcome Measure   Help from another person eating meals?: Total Help from another person taking care of personal grooming?: A Lot Help from another person toileting, which includes using toliet, bedpan, or urinal?: Total Help from another person bathing (including washing, rinsing, drying)?: Total Help from another person to put on and taking off regular upper body clothing?: A Lot Help from another person to put on and taking off regular lower body clothing?: Total 6 Click Score: 8    End of Session    OT Visit Diagnosis: Pain;Muscle weakness (generalized) (M62.81);Other symptoms and signs involving cognitive function   Activity Tolerance Patient tolerated treatment well   Patient Left in bed;with call bell/phone within reach;with family/visitor present   Nurse Communication          Time: 5974-7185 OT Time Calculation (min): 18 min  Charges:  OT General Charges $OT Visit: 1 Visit  Nestor Lewandowsky, OTR/L Acute Rehabilitation Services Pager: [phone] Office: [phone]   [name] 10/13/2018, 4:13 PM

## 2018-10-13 NOTE — Progress Notes (Signed)
Patient continues to have watery stools and is positive for C-diff. Is on day 3 of oral Vanc. Dr.Crim recommended decreasing tube feeds to 20 ml/hr to see if diarrhea decreases. Will continue to monitor.

## 2018-10-14 LAB — HEPARIN LEVEL (UNFRACTIONATED): HEPARIN UNFRACTIONATED: 0.27 [IU]/mL — AB (ref 0.30–0.70)

## 2018-10-14 LAB — CBC
HCT: 30 % — ABNORMAL LOW (ref 36.0–46.0)
Hemoglobin: 8.8 g/dL — ABNORMAL LOW (ref 12.0–15.0)
MCH: 27 pg (ref 26.0–34.0)
MCHC: 29.3 g/dL — ABNORMAL LOW (ref 30.0–36.0)
MCV: 92 fL (ref 80.0–100.0)
Platelets: 291 10*3/uL (ref 150–400)
RBC: 3.26 MIL/uL — ABNORMAL LOW (ref 3.87–5.11)
RDW: 23.1 % — ABNORMAL HIGH (ref 11.5–15.5)
WBC: 20.2 10*3/uL — ABNORMAL HIGH (ref 4.0–10.5)
nRBC: 0.1 % (ref 0.0–0.2)

## 2018-10-14 LAB — GLUCOSE, CAPILLARY
GLUCOSE-CAPILLARY: 236 mg/dL — AB (ref 70–99)
GLUCOSE-CAPILLARY: 133 mg/dL — AB (ref 70–99)
Glucose-Capillary: 119 mg/dL — ABNORMAL HIGH (ref 70–99)
Glucose-Capillary: 122 mg/dL — ABNORMAL HIGH (ref 70–99)
Glucose-Capillary: 243 mg/dL — ABNORMAL HIGH (ref 70–99)
Glucose-Capillary: 269 mg/dL — ABNORMAL HIGH (ref 70–99)

## 2018-10-14 LAB — RENAL FUNCTION PANEL
Albumin: 1.5 g/dL — ABNORMAL LOW (ref 3.5–5.0)
Anion gap: 15 (ref 5–15)
BUN: 41 mg/dL — ABNORMAL HIGH (ref 8–23)
CO2: 19 mmol/L — AB (ref 22–32)
Calcium: 8.3 mg/dL — ABNORMAL LOW (ref 8.9–10.3)
Chloride: 95 mmol/L — ABNORMAL LOW (ref 98–111)
Creatinine, Ser: 3.27 mg/dL — ABNORMAL HIGH (ref 0.44–1.00)
GFR calc Af Amer: 16 mL/min — ABNORMAL LOW (ref >60)
GFR calc non Af Amer: 13 mL/min — ABNORMAL LOW (ref >60)
Glucose, Bld: 261 mg/dL — ABNORMAL HIGH (ref 70–99)
Phosphorus: 7.6 mg/dL — ABNORMAL HIGH (ref 2.5–4.6)
Potassium: 4.1 mmol/L (ref 3.5–5.1)
Sodium: 129 mmol/L — ABNORMAL LOW (ref 135–145)

## 2018-10-14 LAB — PREALBUMIN: Prealbumin: 25.9 mg/dL (ref 18–38)

## 2018-10-14 MED ORDER — HEPARIN SODIUM (PORCINE) 1000 UNIT/ML DIALYSIS
20.0000 [IU]/kg | INTRAMUSCULAR | Status: DC | PRN
  Administered 2018-10-14: 1100 [IU] via INTRAVENOUS_CENTRAL
  Filled 2018-10-14 (×2): qty 2

## 2018-10-14 MED ORDER — HYDROCORTISONE NA SUCCINATE PF 100 MG IJ SOLR
50.0000 mg | Freq: Two times a day (BID) | INTRAMUSCULAR | Status: DC
  Administered 2018-10-14 – 2018-10-15 (×2): 50 mg via INTRAVENOUS
  Filled 2018-10-14 (×2): qty 2

## 2018-10-14 MED ORDER — ZOLPIDEM TARTRATE 5 MG PO TABS: 5.0000 mg | ORAL_TABLET | Freq: Once | ORAL | Status: DC

## 2018-10-14 MED ORDER — HEPARIN SODIUM (PORCINE) 1000 UNIT/ML IJ SOLN
INTRAMUSCULAR | Status: AC
  Filled 2018-10-14: qty 4

## 2018-10-14 NOTE — Progress Notes (Signed)
PROGRESS NOTE    [name]  LKT:[id number] DOB: 1945/11/16 DOA: 09/24/2018 PCP: Sharilyn Sites, MD    Brief Narrative:  73 year old woman with ESRD, peripheral vascular disease, diabetes, hypertension with diastolic dysfunction. Status post left AKA 09/26/2018 for LLE ischemia and metatarsal wound.  Poor p.o. intake, progressive weakness, A. fib since the procedure.  She was later noted to have lethargy and difficulty with airway protection with pooling secretions and increased work of breathing. PCCM consulted for evolving resp failure.  She was intubated on 10/01/2018 and extubated on 10/11/2018 and transferred to the hospitalist service.  Significant Hospital Events   12/27 admission with left lower extremity gangrenous wound 12/29 left AKA 1/3 transfer to ICU and intubated >> ETT removed 1/13  1/4 OGT with bright red blood -Now OG removed.  No evidence of bleeding. 10/13/18: Plastic surgery consulted for large sacral decubitus wound.   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 coli pneumonia   Acute Hypoxic Respiratory Failure on Vent COPD Completed 8 day course merem. Now on room air. Repeat blood cultures 1/9 negative.  -cont duonebs bid   C. Diff Positive PCR 1/9. On oral vancomycin 125mg  QID. -Patient failed swallow evaluation, high aspiration risk per speech therapy.  Core track placed.  Leukocytosis WBC count elevated at 20.2.  However, she is also on Solu-Cortef. -She used to take prednisone at home for lupus. 10/14/2018: We will wean her off the Solu-Cortef.  She was getting 50 mg every 6 hours.  Now changed to every 12 hours.  Hopefully if she remains stable we can discontinue the Solu-Cortef in the next day or so.  Transaminitis: Resolved   Acute on Chronic Anemia  UGIB in setting of anticoagulation likely secondary to NG tube trauma Chronic right-sided abdominal pain - s/p 1U transfusion. Previously seen BRB out of OG tube 1/12.  Now OG has been removed. -Hemoglobin this morning 8.8.  Chronic anemia likely 2/2 ESRD and chronic disease although baseline unclear.  -Continue heparin drip for anticoagulation for A. Fib, transition back to eliquis if Hgb remains stable -Continue PPI  - weekly aranesp - daily CBC   End-stage renal disease, HD TTS Hypokalemia, mild -HD per nephrology recommendations.   Recent LLE AKA for ischemia/gangrene  Prior RLE AKA Peripheral vascular disease S/p LLE AKA on 12/29 -Continue wound care - OT/PT   Large sacral decubitus ulcer Seen by wound care, 11cm x 11cm x unknown depth. Unstageable.  Consulted plastic  surgery.  Further management per plastic surgery.  Appreciate help.  Oral candidiasis -Fluconazole T-Th-S via HD  Hypertension Chronic diastolic CHF Paroxysmal atrial fibrillation -Continue Amiodarone -Continue heparin drip while inpatient -Holding apixaban  10/14/2018: If hemoglobin remains stable then we can  transition to apixaban.  Type II Diabetes Hypoalbuminemia  Continue accu checks, -SSI sensitive -hold novolog q4h 3U and Lantus 5U qd  SLE Outpatient regimen prednisone and Plaquenil --Held Plaquenil given concern for possible sepsis  --Currently on Solu-Cortef, weaning Solu-Cortef.  Plan to transition to home prednisone 10/14/2018: If she remains stable then consider resuming her Plaquenil  3 cm right upper pole renal mass of unclear significance Not clear that any biopsy has been performed.  Per nephrology do not think renal mass is of clinical significance.  Palliative care consulted.  Patient at this time wishes to be full code.   DVT prophylaxis: IV heparin Code Status: Full code Family Communication: No family at bedside Disposition Plan: Likely home when stable  Consultants:   Vascular surgery  Nephrology  Plastic surgery   Procedures:  left AKA 09/26/2018  Antimicrobials:   Completed meropenem  Oral vancomycin   Subjective: Patient appears very frail, debilitated.  Awake and oriented.  Denies any major complaints at this time.  Objective: Vitals:   10/14/18 0741 10/14/18 0800 10/14/18 0900 10/14/18 1000  BP:  (!) 155/27 (!) 166/44 (!) 164/34  Pulse:  82 85 83  Resp:  19 (!) 24 19  Temp:  98.1 F (36.7 C)    TempSrc:  Oral    SpO2: 99% 99% 100% 100%  Weight:      Height:        Intake/Output Summary (Last 24 hours) at 10/14/2018 1014 Last data filed at 10/14/2018 1000 Gross per 24 hour  Intake 1307.8 ml  Output -  Net 1307.8 ml   Filed Weights   10/12/18 0703 10/12/18 1039 10/14/18 0500  Weight: 57.7 kg 56.1 kg 56 kg    Examination:  General exam: Frail-appearing female, awake and oriented Respiratory system: Decreased breath sounds lower lobes, clear to auscultation. Respiratory effort normal. Cardiovascular system: S1 & S2 heard, RRR. No murmurs. Gastrointestinal system: Abdomen is nondistended, soft and nontender. No organomegaly  or masses felt. Normal bowel sounds heard. Central nervous system: Awake and oriented. Generalized weakness without focal deficits Extremities: b/l AKA Skin: No rashes, lesions or ulcers Psychiatry: Judgement and insight appear normal. Mood & affect appropriate.     Data Reviewed: I have personally reviewed following labs and imaging studies  CBC: Recent Labs  Lab 10/11/18 0409 10/11/18 1214 10/12/18 0318 10/13/18 0231 10/14/18 0308  WBC 17.3* 20.6* 17.9* 16.4* 20.2*  HGB 6.6* 8.2* 8.3* 9.2* 8.8*  HCT 22.9* 27.1* 27.6* 30.5* 30.0*  MCV 94.2 91.6 91.7 92.1 92.0  PLT 285 261 281 252 299   Basic Metabolic Panel: Recent Labs  Lab 10/08/18 0929  10/11/18 0409 10/11/18 1503 10/12/18 0318 10/13/18 0231 10/14/18 0308  NA  --    < > 133* 133* 136 133* 129*  K  --    < > 3.0* 3.6 3.2* 4.3 4.1  CL  --    < > 101 100 101 96* 95*  CO2  --    < > 19* 19* 19* 23 19*  GLUCOSE  --    < > 187* 172* 88 191* 261*  BUN  --    < > 74* 81* 85* 23 41*  CREATININE  --    < >  3.95* 4.20* 4.73* 2.26* 3.27*  CALCIUM  --    < > 8.0* 8.1* 8.3* 8.2* 8.3*  MG 1.9  --   --   --  2.0 1.9  --   PHOS  --    < > 5.7* 6.6* 7.2* 5.2* 7.6*   < > = values in this interval not displayed.   GFR: Estimated Creatinine Clearance: 12.2 mL/min (A) (by C-G formula based on SCr of 3.27 mg/dL (H)). Liver Function Tests: Recent Labs  Lab 10/08/18 0559  10/11/18 0409 10/11/18 1503 10/12/18 0318 10/13/18 0231 10/14/18 0308  AST 103*  --   --   --  30  --   --   ALT 74*  --   --   --  22  --   --   ALKPHOS 94  --   --   --  80  --   --   BILITOT 0.6  --   --   --  0.7  --   --   PROT 5.7*  --   --   --  5.6*  --   --   ALBUMIN 1.4*   < > 1.4* 1.4* 1.5* 1.5* 1.5*   < > = values in this interval not displayed.   No results for input(s): LIPASE, AMYLASE in the last 168 hours. No results for input(s): AMMONIA in the last 168 hours. Coagulation Profile: No results for input(s): INR, PROTIME in the last 168  hours. Cardiac Enzymes: No results for input(s): CKTOTAL, CKMB, CKMBINDEX, TROPONINI in the last 168 hours. BNP (last 3 results) No results for input(s): PROBNP in the last 8760 hours. HbA1C: No results for input(s): HGBA1C in the last 72 hours. CBG: Recent Labs  Lab 10/13/18 1617 10/13/18 1941 10/14/18 0039 10/14/18 0307 10/14/18 0809  GLUCAP 177* 218* 243* 236* 269*   Lipid Profile: No results for input(s): CHOL, HDL, LDLCALC, TRIG, CHOLHDL, LDLDIRECT in the last 72 hours. Thyroid Function Tests: No results for input(s): TSH, T4TOTAL, FREET4, T3FREE, THYROIDAB in the last 72 hours. Anemia Panel: Recent Labs    10/12/18 0318  FERRITIN 2,317*  TIBC 119*  IRON 20*   Sepsis Labs: No results for input(s): PROCALCITON, LATICACIDVEN in the last 168 hours.  Recent Results (from the past 240 hour(s))  Culture, blood (routine x 2)     Status: None   Collection Time: 10/05/18  4:05 PM  Result Value Ref Range Status   Specimen Description BLOOD RIGHT HAND  Final   Special Requests   Final    BOTTLES DRAWN AEROBIC ONLY Blood Culture adequate volume   Culture   Final    NO GROWTH 5 DAYS Performed at [facility], [address] [place], [place] [address]    Report Status 10/10/2018 FINAL  Final  Culture, blood (routine x 2)     Status: None   Collection Time: 10/05/18  4:21 PM  Result Value Ref Range Status   Specimen Description BLOOD RIGHT ARM  Final   Special Requests   Final    BOTTLES DRAWN AEROBIC ONLY Blood Culture adequate volume   Culture   Final    NO GROWTH 5 DAYS Performed at [facility], [address], [place], [place] [address]    Report Status 10/10/2018 FINAL  Final  C difficile quick scan w PCR reflex     Status: Abnormal   Collection Time: 10/06/18  7:07 PM  Result Value Ref Range Status  C Diff antigen POSITIVE (A) NEGATIVE Final   C Diff toxin NEGATIVE NEGATIVE Final   C Diff interpretation Results are indeterminate. See PCR results.   Final    Comment: Performed at [facility], [place] [address], [place], [place] [address]  C. Diff by PCR, Reflexed     Status: Abnormal   Collection Time: 10/06/18  7:07 PM  Result Value Ref Range Status   Toxigenic C. Difficile by PCR POSITIVE (A) NEGATIVE Final    Comment: Positive for toxigenic C. difficile with little to no toxin production. Only treat if clinical presentation suggests symptomatic illness. Performed at [facility], [place] [address] [place], [place] [address]          Radiology Studies: Dg Swallowing Func-speech Pathology  Result Date: 10/12/2018 Objective Swallowing Evaluation: Type of Study: MBS-Modified Barium Swallow Study  Patient Details Name: [name] MRN: [id number] Date of Birth: 1946-09-21 Today's Date: 10/12/2018 Time: SLP Start Time (ACUTE ONLY): 1300 -SLP Stop Time (ACUTE ONLY): 1330 SLP Time Calculation (min) (ACUTE ONLY): 30 min Past Medical History: Past Medical History: Diagnosis Date . Anemia  . Arthritis  . Asthma  . Atrial fibrillation ([place])  . Atrial flutter ([place])   a. s/p TEE/DCCV December 2013 b. 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 Bone spur of toe   Right 5th toe . Chronic diastolic CHF (congestive heart failure) (HCC)   a. EF 50-55% by echo in 2015 . Colon polyposis  . COPD (chronic obstructive pulmonary disease) ([place])  . DVT of upper extremity (deep vein thrombosis) ([place])   Right basilic vein, June 1751 . ESRD (end stage renal disease) on dialysis ([place])   "TTF; [place]; off [employer]." (09/07/2018) . Essential hypertension, benign  . Fractures  . Glaucoma  . Lupus ([place])  . Pericarditis  . SLE (systemic lupus erythematosus) ([place])  . Type 2 diabetes mellitus ([place])  . UTI (lower urinary tract infection)  . Wound of right leg 06/2017 Past Surgical History: Past  Surgical History: Procedure Laterality Date . ABDOMINAL AORTOGRAM W/LOWER EXTREMITY N/A 12/18/2017  Procedure: ABDOMINAL AORTOGRAM W/LOWER EXTREMITY;  Surgeon: Elam Dutch, MD;  Location: [facility];  Service: Cardiovascular;  Laterality: N/A;  bilateral . ABDOMINAL HYSTERECTOMY  1983 . AMPUTATION Right 06/08/2018  Procedure: RIGHT ABOVE KNEE AMPUTATION;  Surgeon: Elam Dutch, MD;  Location: [facility] OR;  Service: Vascular;  Laterality: Right; . AMPUTATION Left 09/26/2018  Procedure: AMPUTATION ABOVE KNEE;  Surgeon: Rosetta Posner, MD;  Location: [facility] OR;  Service: Vascular;  Laterality: Left;  WILL BE AN INPATIENT . ANKLE SURGERY  1993 . AV FISTULA PLACEMENT Left 03/27/2014  Procedure: ARTERIOVENOUS (AV) FISTULA CREATION;  Surgeon: Rosetta Posner, MD;  Location: [facility];  Service: Vascular;  Laterality: Left; . BACK SURGERY  1980 . BASCILIC VEIN TRANSPOSITION Left 01/13/2018  Procedure: LEFT BASILIC VEIN TRANSPOSITION SECOND STAGE;  Surgeon: Elam Dutch, MD;  Location: [facility];  Service: Vascular;  Laterality: Left; . CARDIOVASCULAR STRESS TEST  12/19/2009  no stress induced rhythm abnormalities, ekg negative for ischemia . CARDIOVERSION  09/16/2012  Procedure: CARDIOVERSION;  Surgeon: Sanda Klein, MD;  Location: [facility] ENDOSCOPY;  Service: Cardiovascular;  Laterality: N/A; . CATARACT EXTRACTION   . COLONOSCOPY  09/20/2012  Dr. [name]: multiple tubular adenomas  . COLONOSCOPY  2005  Dr. [name].  Polyps, path unknown  . COLONOSCOPY N/A 07/24/2016  Dr. [name]: 3 significant size polyps removed from the colon, tubular adenomas.  Next colonoscopy in October 2020. Marland Kitchen ESOPHAGOGASTRODUODENOSCOPY  09/20/2012  Dr. [name]: duodenal erosion and possible resolving ulcer at the angularis  . ESOPHAGOGASTRODUODENOSCOPY N/A 03/24/2018  Dr. [name]: Mild erosive reflux esophagitis, erosive gastropathy and enteropathy fairly extensive, no H. pylori on biopsy. Marland Kitchen EYE SURGERY   . IR FLUORO GUIDE CV LINE RIGHT  11/09/2017 . IR  RADIOLOGIST EVAL & MGMT  09/01/2018 . IR US GUIDE VASC ACCESS RIGHT  11/09/2017 . REVISON OF ARTERIOVENOUS FISTULA Left 03/15/2018  Procedure: REVISION OF Left arm BASILIC VEIN TRANSPOSITION;  Surgeon: Angelia Mould, MD;  Location: [facility];  Service: Vascular;  Laterality: Left; . TEE WITHOUT CARDIOVERSION  09/16/2012  Procedure: TRANSESOPHAGEAL ECHOCARDIOGRAM (TEE);  Surgeon: Sanda Klein, MD;  Location[facility] ENDOSCOPY;  Service: Cardiovascular;  Laterality: N/A; . TRANSESOPHAGEAL ECHOCARDIOGRAM WITH CARDIOVERSION  09/16/2012  EF 60-65%, moderate LVH, moderate regurg of the aortic valve, LA moderately dilated . TRANSMETATARSAL AMPUTATION Left 09/06/2018  Procedure: TRANSMETATARSAL AMPUTATION;  Surgeon: Angelia Mould, MD;  Location[facility] OR;  Service: Vascular;  Laterality: Left; HPI: Pt is a 73 y.o. female admitted with progressive gangrene of her left foot and is status post AKA on 09/26/2018. She has demonstrated poor p.o. intake, oral thush, as well as " significant difficulty with secretion clearance, evolving poor cough and increased work of breathing in addition to malnutrition". She developed encephalopathy, was unable to clear airway secretions and required intubation on 1/3 with extubation on 1/13. Nursing reported that NG tube placement was attempted four times without success.  Subjective: alert, follows commands Assessment / Plan / Recommendation CHL IP CLINICAL IMPRESSIONS 10/12/2018 Clinical Impression Pt presents with a post-extubation dysphagia characterized by aspiration of tspn-sized boluses of nectar and thin liquids during the swallow.  There is adequate oral control, swallow onset, and pharyngeal constriction, but consistent spilling of materials into posterior larynx/trachea with no cough response elicited.  This pattern of aspiration is typical of post-extubation dysphagia, marked by probable glottal incompetence (corresponding with clinical sign of aphonia) and leading to aspiration  between the posterior vocal folds without sensation.  Purees, due to their thicker nature, were not aspirated.  If necessary, PO meds may be given crushed in puree.  Pt would benefit from a cortrak if it can be successfully placed - she is not yet safe to begin a PO diet.  SLP will follow for PO readiness/diet progression.   SLP Visit Diagnosis Dysphagia, pharyngeal phase (R13.13) Attention and concentration deficit following -- Frontal lobe and executive function deficit following -- Impact on safety and function Severe aspiration risk   CHL IP TREATMENT RECOMMENDATION 10/12/2018 Treatment Recommendations Therapy as outlined in treatment plan below   Prognosis 10/12/2018 Prognosis for Safe Diet Advancement Good Barriers to Reach Goals -- Barriers/Prognosis Comment -- CHL IP DIET RECOMMENDATION 10/12/2018 SLP Diet Recommendations NPO except meds Liquid Administration via -- Medication Administration Crushed with puree Compensations -- Postural Changes --   CHL IP OTHER RECOMMENDATIONS 10/12/2018 Recommended Consults -- Oral Care Recommendations Oral care QID Other Recommendations --   CHL IP FOLLOW UP RECOMMENDATIONS 09/29/2018 Follow up Recommendations Skilled Nursing facility   [facility] IP FREQUENCY AND DURATION 10/12/2018 Speech Therapy Frequency (ACUTE ONLY) min 2x/week Treatment Duration 2 weeks      CHL IP ORAL PHASE 10/12/2018 Oral Phase WFL Oral - Pudding Teaspoon -- Oral - Pudding Cup -- Oral - Honey Teaspoon --  Oral - Honey Cup -- Oral - Nectar Teaspoon -- Oral - Nectar Cup -- Oral - Nectar Straw -- Oral - Thin Teaspoon -- Oral - Thin Cup -- Oral - Thin Straw -- Oral - Puree -- Oral - Mech Soft -- Oral - Regular -- Oral - Multi-Consistency -- Oral - Pill -- Oral Phase - Comment --  CHL IP PHARYNGEAL PHASE 10/12/2018 Pharyngeal Phase Impaired Pharyngeal- Pudding Teaspoon -- Pharyngeal -- Pharyngeal- Pudding Cup -- Pharyngeal -- Pharyngeal- Honey Teaspoon Delayed swallow initiation-vallecula;Reduced airway/laryngeal  closure;Penetration/Aspiration during swallow;Moderate aspiration Pharyngeal Material enters airway, passes BELOW cords without attempt by patient to eject out (silent aspiration) Pharyngeal- Honey Cup -- Pharyngeal -- Pharyngeal- Nectar Teaspoon Delayed swallow initiation-vallecula;Reduced airway/laryngeal closure;Penetration/Aspiration during swallow;Moderate aspiration Pharyngeal Material enters airway, passes BELOW cords without attempt by patient to eject out (silent aspiration) Pharyngeal- Nectar Cup -- Pharyngeal -- Pharyngeal- Nectar Straw -- Pharyngeal -- Pharyngeal- Thin Teaspoon -- Pharyngeal -- Pharyngeal- Thin Cup -- Pharyngeal -- Pharyngeal- Thin Straw -- Pharyngeal -- Pharyngeal- Puree Delayed swallow initiation-vallecula Pharyngeal -- Pharyngeal- Mechanical Soft -- Pharyngeal -- Pharyngeal- Regular -- Pharyngeal -- Pharyngeal- Multi-consistency -- Pharyngeal -- Pharyngeal- Pill -- Pharyngeal -- Pharyngeal Comment --  No flowsheet data found. Juan Quam Laurice 10/12/2018, 5:21 PM                   Scheduled Meds: . sodium chloride   Intravenous Once  . acetaminophen  650 mg Oral Q6H   Or  . acetaminophen (TYLENOL) oral liquid 160 mg/5 mL  650 mg Oral Q6H  . amiodarone  200 mg Per Tube Daily  . brimonidine  1 drop Left Eye BID   And  . timolol  1 drop Left Eye BID  . calcium acetate (Phos Binder)  667 mg Oral TID WC  . Chlorhexidine Gluconate Cloth  6 each Topical Q0600  . collagenase   Topical Daily  . darbepoetin (ARANESP) injection - DIALYSIS  200 mcg Intravenous Q Sat-HD  . feeding supplement (PRO-STAT SUGAR FREE 64)  60 mL Oral BID  . heparin  1,800 Units Dialysis Once in dialysis  . hydrocortisone sodium succinate  50 mg Intravenous Q6H  . insulin aspart  0-9 Units Subcutaneous Q4H  . ipratropium-albuterol  3 mL Nebulization BID  . latanoprost  1 drop Both Eyes QHS  . pantoprazole (PROTONIX) IV  40 mg Intravenous QHS  . sodium chloride flush  3 mL Intravenous Q12H    . vancomycin  125 mg Per Tube QID  . zolpidem  5 mg Oral Once   Continuous Infusions: . sodium chloride Stopped (10/11/18 0902)  . sodium chloride    . sodium chloride    . feeding supplement (VITAL AF 1.2 CAL) 30 mL/hr at 10/14/18 0300  . heparin 900 Units/hr (10/14/18 1000)     LOS: 20 days    Time spent: 25 min    Yaakov Guthrie, MD [facility] Pager on [place]  If 7PM-7AM, please contact night-coverage [email or web address] Password [email or web address] 10/14/2018, 10:14 AM

## 2018-10-14 NOTE — Progress Notes (Signed)
Physical Therapy Wound Treatment Patient Details  Name: [name] MRN: [id number] Date of Birth: 12/08/1945  Today's Date: 10/14/2018 Time: 1791-5056 Time Calculation (min): 55 min  Subjective  Subjective: "Is it going to hurt?" Patient and Family Stated Goals: none stated Date of Onset: (unsure) Prior Treatments: dressing change  Pain Score: Pt with moaning during peri care but appeared comfortable otherwise; Pre medicated  Wound Assessment  Pressure Injury 09/27/18 Unstageable - Full thickness tissue loss in which the base of the ulcer is covered by slough (yellow, tan, gray, green or brown) and/or eschar (tan, brown or black) in the wound bed. (Active)  Wound Image   10/14/2018 11:00 AM  Dressing Type Gauze (Comment) 10/14/2018 12:15 PM  Dressing Clean;Dry;Intact 10/14/2018 12:15 PM  Dressing Change Frequency Daily 10/14/2018 11:00 AM  State of Healing Eschar 10/14/2018 11:00 AM  Site / Wound Assessment Red;Black 10/14/2018 11:00 AM  % Wound base Red or Granulating 10% 10/14/2018 11:00 AM  % Wound base Yellow/Fibrinous Exudate 0% 10/14/2018 11:00 AM  % Wound base Black/Eschar 90% 10/14/2018 11:00 AM  % Wound base Other/Granulation Tissue (Comment) 0% 10/14/2018 11:00 AM  Peri-wound Assessment Erythema (blanchable);Pink;Excoriated 10/14/2018 11:00 AM  Wound Length (cm) 9.5 cm 10/14/2018 11:00 AM  Wound Width (cm) 9.3 cm 10/14/2018 11:00 AM  Wound Depth (cm) 0.1 cm 10/14/2018 11:00 AM  Wound Surface Area (cm^2) 88.35 cm^2 10/14/2018 11:00 AM  Wound Volume (cm^3) 8.84 cm^3 10/14/2018 11:00 AM  Tunneling (cm) 0 10/06/2018  8:00 PM  Undermining (cm) 0 10/06/2018  8:00 PM  Margins Unattached edges (unapproximated) 10/14/2018 11:00 AM  Drainage Amount None 10/13/2018  8:00 PM  Drainage Description Sanguineous 10/14/2018 11:00 AM  Treatment Debridement (Selective);Hydrotherapy (Pulse lavage) 10/14/2018 11:00 AM     Hydrotherapy Pulsed lavage therapy - wound location: sacrum Pulsed Lavage with  Suction (psi): 12 psi Pulsed Lavage with Suction - Normal Saline Used: 1000 mL Pulsed Lavage Tip: Tip with splash shield Selective Debridement Selective Debridement - Location: sacrum Selective Debridement - Tools Used: Scalpel Selective Debridement - Tissue Removed: scored adherent eschar   Wound Assessment and Plan  Wound Therapy - Assess/Plan/Recommendations Wound Therapy - Clinical Statement: Patient presents to hydrotherapy with large unstageable sacral wound. Can benefift from hydrotherapy for removal of nonviable tissue. Wound Therapy - Functional Problem List: Decr mobililty, decr sitting balance Factors Delaying/Impairing Wound Healing: Diabetes Mellitus;Immobility;Multiple medical problems Hydrotherapy Plan: Debridement;Dressing change;Patient/family education;Pulsatile lavage with suction Wound Therapy - Frequency: 6X / week Wound Therapy - Follow Up Recommendations: Skilled nursing facility Wound Plan: See above  Wound Therapy Goals- Improve the function of patient's integumentary system by progressing the wound(s) through the phases of wound healing (inflammation - proliferation - remodeling) by: Decrease Necrotic Tissue to: 80 Decrease Necrotic Tissue - Progress: Goal set today Increase Granulation Tissue to: 20 Increase Granulation Tissue - Progress: Goal set today Goals/treatment plan/discharge plan were made with and agreed upon by patient/family: Yes Time For Goal Achievement: 7 days Wound Therapy - Potential for Goals: Good  Goals will be updated until maximal potential achieved or discharge criteria met.  Discharge criteria: when goals achieved, discharge from hospital, MD decision/surgical intervention, no progress towards goals, refusal/missing three consecutive treatments without notification or medical reason.  GP    Ellamae Sia, PT, DPT Acute Rehabilitation Services Pager [phone] Office [phone]   Willy Eddy 10/14/2018, 12:54 PM

## 2018-10-14 NOTE — Progress Notes (Signed)
[name] for Heparin (Apixaban on hold) Indication: atrial fibrillation / DVT  Allergies  Allergen Reactions  . Ace Inhibitors Cough  . Fentanyl     Fentanyl PATCH causes lethargy, somnolence, and AMS. Tolerated IV Fentanyl in small increments.    Patient Measurements: TBW 56 kg Heparin Dosing Weight: TBW  Vital Signs: Temp: 97.6 F (36.4 C) (01/16 0300) Temp Source: Oral (01/16 0300) BP: 156/31 (01/16 0700) Pulse Rate: 83 (01/16 0700)  Labs: Recent Labs    10/12/18 0318  10/13/18 0231 10/13/18 1356 10/14/18 0308  HGB 8.3*  --  9.2*  --  8.8*  HCT 27.6*  --  30.5*  --  30.0*  PLT 281  --  252  --  291  APTT 67*  --   --   --   --   HEPARINUNFRC 0.28*   < > 0.25* 0.59 0.27*  CREATININE 4.73*  --  2.26*  --  3.27*   < > = values in this interval not displayed.    Estimated Creatinine Clearance: 12.2 mL/min (A) (by C-G formula based on SCr of 3.27 mg/dL (H)).  Assessment: 73 yo F presents with wound infection. On Eliquis PTA for DVT and Afib- last dose was 1/4 at 1000.   Eliquis held due to suspicion for GIB.  Pharmacy asked to restart anticoagulation with IV heparin.  Heparin level below goal this morning. CBC slightly down but overall stable. No overt bleeding or complications noted.   Goal of Therapy:  Heparin level 0.3-0.7 units/ml Monitor platelets by anticoagulation protocol: Yes   Plan:  Increase IV heparin to 900 units/hr. Recheck heparin level in 8 hrs Daily heparin level and CBC F/u ability to resume enteral anticoagulation eventually. (plans for HD access?)  Jackson Latino, PharmD PGY1 Pharmacy Resident Phone [phone] 10/14/2018     7:18 AM

## 2018-10-14 NOTE — Progress Notes (Signed)
Inpatient Diabetes Program Recommendations  AACE/ADA: New Consensus Statement on Inpatient Glycemic Control (2015)  Target Ranges:  Prepandial:   less than 140 mg/dL      Peak postprandial:   less than 180 mg/dL (1-2 hours)      Critically ill patients:  140 - 180 mg/dL   Lab Results  Component Value Date   GLUCAP 269 (H) 10/14/2018   HGBA1C 4.5 (L) 09/06/2018    Review of Glycemic Control Results for [name], [name] (MRN [id number]) as of 10/14/2018 09:08  Ref. Range 10/13/2018 16:17 10/13/2018 19:41 10/14/2018 00:39 10/14/2018 03:07 10/14/2018 08:09  Glucose-Capillary Latest Ref Range: 70 - 99 mg/dL 177 (H) 218 (H) 243 (H) 236 (H) 269 (H)    Inpatient Diabetes Program Recommendations:   Noted hyperglycemia with CBGs>200 with started on tube feedings. -Increase Novolog correction to moderate -Add Novolog 3 units tube feed coverage q 4 hrs (hold if tube feed stopped or held)  Thank you, Bethena Roys E. Austyn Perriello, RN, MSN, CDE  Diabetes Coordinator Inpatient Glycemic Control Team Team Pager [phone] (8am-5pm) 10/14/2018 9:10 AM

## 2018-10-14 NOTE — Care Management Note (Addendum)
Case Management Note  Patient Details  Name: [name] MRN: [id number] Date of Birth: 06/16/1946  Subjective/Objective:  73 yo female presented  With progressive gangrene of her left foot; s/p L AKA 09/26/18.                Action/Plan: CM following for dispositional needs. Patient from [place] with ESRD on HD. Patient is being followed by PT for wound treatment to her large sacral wound. Patient must be able to sit upright for outpatient HD sessions, with patient very deconditioned and per the last PT progress note 1/15 only able to tolerate sitting EOB for 5 minutes; PT recommend SNF vs LTACH with various barriers identified to discharge at this time; both discharge dispositions will require insurance authorization, with patients ability to maintain upright position for continue outpatient HD. Palliative was consulted for [place] with family requesting max efforts and Full Code status. CM team will continue following along with the CSW.   Expected Discharge Date:  09/27/18               Expected Discharge Plan:  Skilled Nursing Facility  In-House Referral:  Clinical Social Work  Discharge planning Services  CM Consult  Post Acute Care Choice:  NA Choice offered to:  NA  DME Arranged:  N/A DME Agency:  NA  HH Arranged:  NA HH Agency:  NA  Status of Service:  In process, will continue to follow  If discussed at Long Length of Stay Meetings, dates discussed:    Additional Comments:  Midge Minium RN, BSN, NCM-BC, ACM-RN [phone] 10/14/2018, 4:23 PM

## 2018-10-14 NOTE — Progress Notes (Signed)
Subjective:  - stable overnight - very deconditioned- large sacral wound  Objective Vital signs in last 24 hours: Vitals:   10/14/18 0700 10/14/18 0741 10/14/18 0800 10/14/18 0900  BP: (!) 156/31  (!) 155/27 (!) 166/44  Pulse: 83  82 85  Resp: 15  19 (!) 24  Temp:   98.1 F (36.7 C)   TempSrc:   Oral   SpO2: 100% 99% 99% 100%  Weight:      Height:       Weight change: -1.7 kg  Intake/Output Summary (Last 24 hours) at 10/14/2018 0953 Last data filed at 10/14/2018 0920 Gross per 24 hour  Intake 1330.8 ml  Output -  Net 1330.8 ml   Dialysis Orders:  [facility] TTS  3.5h 61kg 400/800 Hep 1800 R TDC Mircera 225 q2weeks (last 09/14/18)   Assessment/ Plan: Pt is a 73 y.o. yo female ESRD who was admitted on 09/24/2018 with FTT after AKA culminating with PNA and respiratory failure req vent Assessment/Plan: 1. Pulm/ID - PNA and resp failure- on meropenam - extubated- sat 100%- WBC still up.  Now also C diff- on vanc  2. ESRD - normally TTS- had HD Tues, would next be due today via TDC 3. Anemia- req transfusion this hosp- on max darbe- no iron right now 4. Secondary hyperparathyroidism- phoslyra ordered, phos steady and not her biggest concern right now - no recent PTH data here 5. HTN/volume- BP stable- no BP meds- volume status slightly heavy- remove volume as able with HD today  6. FTT- low albumin/decub that is large - has had palliative discussion but family requesting continued max efforts, cont with these discussions -difficult because is alert - FYI I do not think renal mass is of clinical significance- would not chase down either  [name]    Labs: Basic Metabolic Panel: Recent Labs  Lab 10/12/18 0318 10/13/18 0231 10/14/18 0308  NA 136 133* 129*  K 3.2* 4.3 4.1  CL 101 96* 95*  CO2 19* 23 19*  GLUCOSE 88 191* 261*  BUN 85* 23 41*  CREATININE 4.73* 2.26* 3.27*  CALCIUM 8.3* 8.2* 8.3*  PHOS 7.2* 5.2* 7.6*   Liver Function Tests: Recent Labs  Lab  10/08/18 0559  10/12/18 0318 10/13/18 0231 10/14/18 0308  AST 103*  --  30  --   --   ALT 74*  --  22  --   --   ALKPHOS 94  --  80  --   --   BILITOT 0.6  --  0.7  --   --   PROT 5.7*  --  5.6*  --   --   ALBUMIN 1.4*   < > 1.5* 1.5* 1.5*   < > = values in this interval not displayed.   No results for input(s): LIPASE, AMYLASE in the last 168 hours. No results for input(s): AMMONIA in the last 168 hours. CBC: Recent Labs  Lab 10/11/18 0409 10/11/18 1214 10/12/18 0318 10/13/18 0231 10/14/18 0308  WBC 17.3* 20.6* 17.9* 16.4* 20.2*  HGB 6.6* 8.2* 8.3* 9.2* 8.8*  HCT 22.9* 27.1* 27.6* 30.5* 30.0*  MCV 94.2 91.6 91.7 92.1 92.0  PLT 285 261 281 252 291   Cardiac Enzymes: No results for input(s): CKTOTAL, CKMB, CKMBINDEX, TROPONINI in the last 168 hours. CBG: Recent Labs  Lab 10/13/18 1617 10/13/18 1941 10/14/18 0039 10/14/18 0307 10/14/18 0809  GLUCAP 177* 218* 243* 236* 269*    Iron Studies:  Recent Labs    10/12/18  0318  IRON 20*  TIBC 119*  FERRITIN 2,317*   Studies/Results: Dg Swallowing Func-speech Pathology  Result Date: 10/12/2018 Objective Swallowing Evaluation: Type of Study: MBS-Modified Barium Swallow Study  Patient Details Name: [name] MRN: [id number] Date of Birth: 10-Nov-1945 Today's Date: 10/12/2018 Time: SLP Start Time (ACUTE ONLY): 1300 -SLP Stop Time (ACUTE ONLY): 1330 SLP Time Calculation (min) (ACUTE ONLY): 30 min Past Medical History: Past Medical History: Diagnosis Date . Anemia  . Arthritis  . Asthma  . Atrial fibrillation ([place])  . Atrial flutter ([place])   a. s/p TEE/DCCV December 2013 b. 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 Bone spur of toe   Right 5th toe . Chronic diastolic CHF (congestive heart failure) ([facility])   a. EF 50-55% by echo in 2015 . Colon polyposis  .  COPD (chronic obstructive pulmonary disease) ([place])  . DVT of upper extremity (deep vein thrombosis) ([place])   Right basilic vein, June 3846 . ESRD (end stage renal disease) on dialysis ([place])   "TTF; [place]; off [employer]." (09/07/2018) . Essential hypertension, benign  . Fractures  . Glaucoma  . Lupus ([place])  . Pericarditis  . SLE (systemic lupus erythematosus) ([place])  . Type 2 diabetes mellitus ([place])  . UTI (lower urinary tract infection)  . Wound of right leg 06/2017 Past Surgical History: Past Surgical History: Procedure Laterality Date . ABDOMINAL AORTOGRAM W/LOWER EXTREMITY N/A 12/18/2017  Procedure: ABDOMINAL AORTOGRAM W/LOWER EXTREMITY;  Surgeon: Elam Dutch, MD;  Location: [facility];  Service: Cardiovascular;  Laterality: N/A;  bilateral . ABDOMINAL HYSTERECTOMY  1983 . AMPUTATION Right 06/08/2018  Procedure: RIGHT ABOVE KNEE AMPUTATION;  Surgeon: Elam Dutch, MD;  Location: [facility] OR;  Service: Vascular;  Laterality: Right; . AMPUTATION Left 09/26/2018  Procedure: AMPUTATION ABOVE KNEE;  Surgeon: Rosetta Posner, MD;  Location: [facility] OR;  Service: Vascular;  Laterality: Left;  WILL BE AN INPATIENT . ANKLE SURGERY  1993 . AV FISTULA PLACEMENT Left 03/27/2014  Procedure: ARTERIOVENOUS (AV) FISTULA CREATION;  Surgeon: Rosetta Posner, MD;  Location: [facility];  Service: Vascular;  Laterality: Left; . BACK SURGERY  1980 . BASCILIC VEIN TRANSPOSITION Left 01/13/2018  Procedure: LEFT BASILIC VEIN TRANSPOSITION SECOND STAGE;  Surgeon: Elam Dutch, MD;  Location: [facility];  Service: Vascular;  Laterality: Left; . CARDIOVASCULAR STRESS TEST  12/19/2009  no stress induced rhythm abnormalities, ekg negative for ischemia . CARDIOVERSION  09/16/2012  Procedure: CARDIOVERSION;  Surgeon: Sanda Klein, MD;  Location: [facility];  Service: Cardiovascular;  Laterality: N/A; . CATARACT EXTRACTION   . COLONOSCOPY  09/20/2012  Dr. [name]: multiple tubular adenomas  . COLONOSCOPY  2005  Dr. [name]. Polyps, path unknown  .  COLONOSCOPY N/A 07/24/2016  Dr. [name]: 3 significant size polyps removed from the colon, tubular adenomas.  Next colonoscopy in October 2020. Marland Kitchen ESOPHAGOGASTRODUODENOSCOPY  09/20/2012  Dr. [name]: duodenal erosion and possible resolving ulcer at the angularis  . ESOPHAGOGASTRODUODENOSCOPY N/A 03/24/2018  Dr. [name]: Mild erosive reflux esophagitis, erosive gastropathy and enteropathy fairly extensive, no H. pylori on biopsy. Marland Kitchen EYE SURGERY   . IR FLUORO GUIDE CV LINE RIGHT  11/09/2017 . IR RADIOLOGIST EVAL & MGMT  09/01/2018 . IR US GUIDE VASC ACCESS RIGHT  11/09/2017 . REVISON OF ARTERIOVENOUS FISTULA Left 03/15/2018  Procedure: REVISION OF Left arm BASILIC VEIN TRANSPOSITION;  Surgeon: Scot Dock,  Judeth Cornfield, MD;  Location: [facility];  Service: Vascular;  Laterality: Left; . TEE WITHOUT CARDIOVERSION  09/16/2012  Procedure: TRANSESOPHAGEAL ECHOCARDIOGRAM (TEE);  Surgeon: Sanda Klein, MD;  Location: [facility] ENDOSCOPY;  Service: Cardiovascular;  Laterality: N/A; . TRANSESOPHAGEAL ECHOCARDIOGRAM WITH CARDIOVERSION  09/16/2012  EF 60-65%, moderate LVH, moderate regurg of the aortic valve, LA moderately dilated . TRANSMETATARSAL AMPUTATION Left 09/06/2018  Procedure: TRANSMETATARSAL AMPUTATION;  Surgeon: Angelia Mould, MD;  Location: [facility] OR;  Service: Vascular;  Laterality: Left; HPI: Pt is a 73 y.o. female admitted with progressive gangrene of her left foot and is status post AKA on 09/26/2018. She has demonstrated poor p.o. intake, oral thush, as well as " significant difficulty with secretion clearance, evolving poor cough and increased work of breathing in addition to malnutrition". She developed encephalopathy, was unable to clear airway secretions and required intubation on 1/3 with extubation on 1/13. Nursing reported that NG tube placement was attempted four times without success.  Subjective: alert, follows commands Assessment / Plan / Recommendation CHL IP CLINICAL IMPRESSIONS 10/12/2018 Clinical Impression Pt  presents with a post-extubation dysphagia characterized by aspiration of tspn-sized boluses of nectar and thin liquids during the swallow.  There is adequate oral control, swallow onset, and pharyngeal constriction, but consistent spilling of materials into posterior larynx/trachea with no cough response elicited.  This pattern of aspiration is typical of post-extubation dysphagia, marked by probable glottal incompetence (corresponding with clinical sign of aphonia) and leading to aspiration between the posterior vocal folds without sensation.  Purees, due to their thicker nature, were not aspirated.  If necessary, PO meds may be given crushed in puree.  Pt would benefit from a cortrak if it can be successfully placed - she is not yet safe to begin a PO diet.  SLP will follow for PO readiness/diet progression.   SLP Visit Diagnosis Dysphagia, pharyngeal phase (R13.13) Attention and concentration deficit following -- Frontal lobe and executive function deficit following -- Impact on safety and function Severe aspiration risk   CHL IP TREATMENT RECOMMENDATION 10/12/2018 Treatment Recommendations Therapy as outlined in treatment plan below   Prognosis 10/12/2018 Prognosis for Safe Diet Advancement Good Barriers to Reach Goals -- Barriers/Prognosis Comment -- CHL IP DIET RECOMMENDATION 10/12/2018 SLP Diet Recommendations NPO except meds Liquid Administration via -- Medication Administration Crushed with puree Compensations -- Postural Changes --   CHL IP OTHER RECOMMENDATIONS 10/12/2018 Recommended Consults -- Oral Care Recommendations Oral care QID Other Recommendations --   CHL IP FOLLOW UP RECOMMENDATIONS 09/29/2018 Follow up Recommendations Skilled Nursing facility   [facility] IP FREQUENCY AND DURATION 10/12/2018 Speech Therapy Frequency (ACUTE ONLY) min 2x/week Treatment Duration 2 weeks      CHL IP ORAL PHASE 10/12/2018 Oral Phase WFL Oral - Pudding Teaspoon -- Oral - Pudding Cup -- Oral - Honey Teaspoon -- Oral - Honey Cup --  Oral - Nectar Teaspoon -- Oral - Nectar Cup -- Oral - Nectar Straw -- Oral - Thin Teaspoon -- Oral - Thin Cup -- Oral - Thin Straw -- Oral - Puree -- Oral - Mech Soft -- Oral - Regular -- Oral - Multi-Consistency -- Oral - Pill -- Oral Phase - Comment --  CHL IP PHARYNGEAL PHASE 10/12/2018 Pharyngeal Phase Impaired Pharyngeal- Pudding Teaspoon -- Pharyngeal -- Pharyngeal- Pudding Cup -- Pharyngeal -- Pharyngeal- Honey Teaspoon Delayed swallow initiation-vallecula;Reduced airway/laryngeal closure;Penetration/Aspiration during swallow;Moderate aspiration Pharyngeal Material enters airway, passes BELOW cords without attempt by patient to eject out (silent aspiration) Pharyngeal- Honey Cup -- Pharyngeal -- Pharyngeal-  Nectar Teaspoon Delayed swallow initiation-vallecula;Reduced airway/laryngeal closure;Penetration/Aspiration during swallow;Moderate aspiration Pharyngeal Material enters airway, passes BELOW cords without attempt by patient to eject out (silent aspiration) Pharyngeal- Nectar Cup -- Pharyngeal -- Pharyngeal- Nectar Straw -- Pharyngeal -- Pharyngeal- Thin Teaspoon -- Pharyngeal -- Pharyngeal- Thin Cup -- Pharyngeal -- Pharyngeal- Thin Straw -- Pharyngeal -- Pharyngeal- Puree Delayed swallow initiation-vallecula Pharyngeal -- Pharyngeal- Mechanical Soft -- Pharyngeal -- Pharyngeal- Regular -- Pharyngeal -- Pharyngeal- Multi-consistency -- Pharyngeal -- Pharyngeal- Pill -- Pharyngeal -- Pharyngeal Comment --  No flowsheet data found. Juan Quam Laurice 10/12/2018, 5:21 PM              Medications: Infusions: . sodium chloride Stopped (10/11/18 0902)  . sodium chloride    . sodium chloride    . feeding supplement (VITAL AF 1.2 CAL) 30 mL/hr at 10/14/18 0300  . heparin 900 Units/hr (10/14/18 0920)    Scheduled Medications: . sodium chloride   Intravenous Once  . acetaminophen  650 mg Oral Q6H   Or  . acetaminophen (TYLENOL) oral liquid 160 mg/5 mL  650 mg Oral Q6H  . amiodarone  200 mg Per  Tube Daily  . brimonidine  1 drop Left Eye BID   And  . timolol  1 drop Left Eye BID  . calcium acetate (Phos Binder)  667 mg Oral TID WC  . Chlorhexidine Gluconate Cloth  6 each Topical Q0600  . collagenase   Topical Daily  . darbepoetin (ARANESP) injection - DIALYSIS  200 mcg Intravenous Q Sat-HD  . feeding supplement (PRO-STAT SUGAR FREE 64)  60 mL Oral BID  . heparin  1,800 Units Dialysis Once in dialysis  . hydrocortisone sodium succinate  50 mg Intravenous Q6H  . insulin aspart  0-9 Units Subcutaneous Q4H  . ipratropium-albuterol  3 mL Nebulization BID  . latanoprost  1 drop Both Eyes QHS  . pantoprazole (PROTONIX) IV  40 mg Intravenous QHS  . sodium chloride flush  3 mL Intravenous Q12H  . vancomycin  125 mg Per Tube QID  . zolpidem  5 mg Oral Once    have reviewed scheduled and prn medications.  Physical Exam: General: soft spoken- remembers me - frail  Heart: RRR Lungs: mostly clear Abdomen: soft, non tender Extremities: min edema- tender to touch- bilat AKA Dialysis Access: right sided TDC     10/14/2018,9:53 AM  LOS: 20 days

## 2018-10-14 NOTE — Progress Notes (Signed)
Patient to unit from [place] in hosp bed. Skin assessment complete and large dressing intact to sacrum, bilateral gluteal folds with open skin areas, L anterior thigh as well. Foam dressing to L hip intact. L AKA site with intact staples and no drainage. Air bed ordered. CHG bath given, telemetry applied and patient oriented to room and unit. CCMD notified.

## 2018-10-15 LAB — GLUCOSE, CAPILLARY
GLUCOSE-CAPILLARY: 239 mg/dL — AB (ref 70–99)
GLUCOSE-CAPILLARY: 166 mg/dL — AB (ref 70–99)
Glucose-Capillary: 305 mg/dL — ABNORMAL HIGH (ref 70–99)
Glucose-Capillary: 250 mg/dL — ABNORMAL HIGH (ref 70–99)
Glucose-Capillary: 189 mg/dL — ABNORMAL HIGH (ref 70–99)
Glucose-Capillary: 192 mg/dL — ABNORMAL HIGH (ref 70–99)

## 2018-10-15 LAB — CBC
HEMATOCRIT: 31.9 % — AB (ref 36.0–46.0)
HEMOGLOBIN: 9.5 g/dL — AB (ref 12.0–15.0)
MCH: 28.2 pg (ref 26.0–34.0)
MCHC: 29.8 g/dL — ABNORMAL LOW (ref 30.0–36.0)
MCV: 94.7 fL (ref 80.0–100.0)
Platelets: 230 10*3/uL (ref 150–400)
RBC: 3.37 MIL/uL — ABNORMAL LOW (ref 3.87–5.11)
RDW: 23.1 % — ABNORMAL HIGH (ref 11.5–15.5)
WBC: 17.7 10*3/uL — ABNORMAL HIGH (ref 4.0–10.5)
nRBC: 0 % (ref 0.0–0.2)

## 2018-10-15 LAB — HEPARIN LEVEL (UNFRACTIONATED): Heparin Unfractionated: 0.31 IU/mL (ref 0.30–0.70)

## 2018-10-15 MED ORDER — DAKINS (1/4 STRENGTH) 0.125 % EX SOLN
Freq: Every morning | CUTANEOUS | Status: AC
  Administered 2018-10-16 – 2018-10-18 (×3)
  Filled 2018-10-15: qty 473

## 2018-10-15 MED ORDER — CALCIUM ACETATE (PHOS BINDER) 667 MG/5ML PO SOLN
667.0000 mg | Freq: Three times a day (TID) | ORAL | Status: DC
  Administered 2018-10-16 (×3): 667 mg
  Filled 2018-10-15 (×8): qty 5

## 2018-10-15 MED ORDER — PREDNISONE 20 MG PO TABS
20.0000 mg | ORAL_TABLET | Freq: Every day | ORAL | Status: DC
  Administered 2018-10-16 – 2018-10-17 (×2): 20 mg
  Filled 2018-10-15 (×2): qty 1

## 2018-10-15 MED ORDER — CHLORHEXIDINE GLUCONATE CLOTH 2 % EX PADS
6.0000 | MEDICATED_PAD | Freq: Every day | CUTANEOUS | Status: DC
  Administered 2018-10-15 – 2018-10-16 (×2): 6 via TOPICAL

## 2018-10-15 MED ORDER — ACETAMINOPHEN 160 MG/5ML PO SOLN
650.0000 mg | Freq: Four times a day (QID) | ORAL | Status: DC
  Administered 2018-10-16 – 2018-10-20 (×17): 650 mg
  Filled 2018-10-15 (×16): qty 20.3

## 2018-10-15 MED ORDER — PRO-STAT SUGAR FREE PO LIQD
60.0000 mL | Freq: Two times a day (BID) | ORAL | Status: DC
  Administered 2018-10-15 – 2018-10-21 (×13): 60 mL
  Filled 2018-10-15 (×14): qty 60

## 2018-10-15 MED ORDER — HYDROCORTISONE NA SUCCINATE PF 100 MG IJ SOLR
50.0000 mg | Freq: Two times a day (BID) | INTRAMUSCULAR | Status: AC
  Administered 2018-10-15: 50 mg via INTRAVENOUS
  Filled 2018-10-15: qty 2

## 2018-10-15 MED ORDER — ALBUTEROL SULFATE (2.5 MG/3ML) 0.083% IN NEBU: 2.5000 mg | INHALATION_SOLUTION | RESPIRATORY_TRACT | Status: DC | PRN

## 2018-10-15 MED ORDER — POTASSIUM CHLORIDE 20 MEQ/15ML (10%) PO SOLN
30.0000 meq | Freq: Once | ORAL | Status: AC
  Administered 2018-10-15: 30 meq
  Filled 2018-10-15: qty 30

## 2018-10-15 NOTE — Progress Notes (Signed)
  Speech Language Pathology Treatment: Dysphagia  Patient Details Name: [name] MRN: [id number] DOB: 1946-07-10 Today's Date: 10/15/2018 Time: 1120-1150 SLP Time Calculation (min) (ACUTE ONLY): 30 min  Assessment / Plan / Recommendation Clinical Impression  SLP follow up to determine readiness for po intake. Pt continues to be largely aphonic, but reports she feels her voice quality is slightly better today. Suction was set up to facilitate thorough oral care. Pt completed oral care after set up. SLP then provided 5 individual ice chips, which resulted in cough response x1.   Recommend provision of 3-4 ice chips ONLY following oral care, given individually, with full supervision. SLP will continue to follow to assess readiness for po intake and/or repeat instrumental study. RN and MD informed.    HPI HPI: Pt is a 73 y.o. female admitted with progressive gangrene of her left foot and is status post AKA on 09/26/2018. She has demonstrated poor p.o. intake, oral thush, as well as " significant difficulty with secretion clearance, evolving poor cough and increased work of breathing in addition to malnutrition". She developed encephalopathy, was unable to clear airway secretions and required intubation on 1/3 with extubation on 1/13. Nursing reported that NG tube placement was attempted four times without success.  Pt now with Cortrak. Voice largely aphonic.      SLP Plan  Continue with current plan of care       Recommendations  Diet recommendations: NPO; 3-4 ice chips after oral care. Medication Administration: Via alternative means                Oral Care Recommendations: Oral care prior to ice chip/H20 Follow up Recommendations: Skilled Nursing facility SLP Visit Diagnosis: Dysphagia, pharyngeal phase (R13.13) Plan: Continue with current plan of care       GO               Deondrea Aguado B. Quentin Ore [facility], CCC-SLP Speech Language Pathologist [phone]  Shonna Chock 10/15/2018, 12:00 PM

## 2018-10-15 NOTE — Progress Notes (Signed)
[place] TEAM 1 - Stepdown/ICU TEAM  [name]  MOL:[id number] DOB: August 09, 1946 DOA: 09/24/2018 PCP: Sharilyn Sites, MD    Brief Narrative:  73 year old woman with ESRD, peripheral vascular disease, diabetes, hypertension with diastolic dysfunction, left AKA 09/26/2018 for LLE ischemia and metatarsal wound. Poor p.o. intake, progressive weakness, A. fib since the procedure. She was later noted to have lethargy and difficulty with airway protection with pooling secretions and increased work of breathing. PCCM consulted for evolving resp failure.  She was intubated on 10/01/2018 and extubated on 10/11/2018 and transferred to the Hospitalist service.  Significant Events: 12/27 admission with left lower extremity gangrenous wound 12/29 left AKA 1/3 transfer to ICU and intubated 1/4 OGT with bright red blood 1/13 ETT removed  1/15 Plastic surgery consulted for large sacral decubitus wound  Subjective: The patient is resting comfortably in bed.  She states she is feeling much better today.  She denies uncontrolled pain or respiratory distress.  She reports a slowly improving appetite.  Assessment & Plan:  Septic shock and Acute hypoxic resp failure due to ESBL E. coli pneumonia  Completed 8 day course merem - repeat blood cultures 1/9 negative - clinically stable on RA presently   C diff colitis  oral vancomycin 125mg  QID - flexiseal in place   Acute on Chronic Anemia - UGIB in setting of anticoagulation likely secondary to NG tube trauma - chronic anemia likely due to ESRD and chronic disease - s/p 1U PRBC this admit   Chronic right-sided abdominal pain  End-stage renal disease - HD TTS Care per Nephrology   Recent L AKA for ischemia/gangrene - prior R AKA - PVD continue wound care  Large sacral decubitus ulcer WOC team following - Plastic Surgery consulted   Hypertension  Chronic diastolic CHF  Paroxysmal atrial fibrillation Continue Amiodarone - heparin  drip until clear no further active bleeding / holding apixaban   DM2 CBG not ideal - follow w/o change for now - will likely soon require titration of meds  SLE Holding Plaquenil given concern for sepsis - weaning Solu-Cortef w/ plan to transition to home prednisone  3 cm right upper pole renal mass of unclear significance per Nephrology do not think renal mass is of clinical significance  COPD  Well compensated  DVT prophylaxis: heparin per pharmacy  Code Status: FULL CODE Family Communication: spoke w/ husband at bedside  Disposition Plan:   Consultants:  Vascular Surgery Nephrology Plastic Surgery  Antimicrobials:  Oral vanc 1/9 >  Objective: Blood pressure (!) 129/25, pulse 78, temperature (!) 97 F (36.1 C), temperature source Axillary, resp. rate 18, height 5' (1.524 m), weight 48.5 kg, SpO2 100 %.  Intake/Output Summary (Last 24 hours) at 10/15/2018 1558 Last data filed at 10/14/2018 2300 Gross per 24 hour  Intake 293.01 ml  Output 1100 ml  Net -806.99 ml   Filed Weights   10/14/18 1210 10/14/18 1615 10/15/18 0500  Weight: 55.1 kg 54 kg 48.5 kg    Examination: General: No acute respiratory distress Lungs: Clear to auscultation bilaterally without wheezes or crackles Cardiovascular: Regular rate without murmur  Abdomen: Nontender, nondistended, soft, bowel sounds positive, no rebound  CBC: Recent Labs  Lab 10/13/18 0231 10/14/18 0308 10/15/18 1124  WBC 16.4* 20.2* 17.7*  HGB 9.2* 8.8* 9.5*  HCT 30.5* 30.0* 31.9*  MCV 92.1 92.0 94.7  PLT 252 291 201   Basic Metabolic Panel: Recent Labs  Lab 10/12/18 0318 10/13/18 0231 10/14/18 0308 10/15/18 1124  NA 136 133*  129* 136  K 3.2* 4.3 4.1 2.9*  CL 101 96* 95* 98  CO2 19* 23 19* 25  GLUCOSE 88 191* 261* 190*  BUN 85* 23 41* 25*  CREATININE 4.73* 2.26* 3.27* 2.11*  CALCIUM 8.3* 8.2* 8.3* 8.1*  MG 2.0 1.9  --   --   PHOS 7.2* 5.2* 7.6* 4.0   GFR: Estimated Creatinine Clearance: 17.3 mL/min  (A) (by C-G formula based on SCr of 2.11 mg/dL (H)).  Liver Function Tests: Recent Labs  Lab 10/12/18 0318 10/13/18 0231 10/14/18 0308 10/15/18 1124  AST 30  --   --   --   ALT 22  --   --   --   ALKPHOS 80  --   --   --   BILITOT 0.7  --   --   --   PROT 5.6*  --   --   --   ALBUMIN 1.5* 1.5* 1.5* 1.6*    HbA1C: Hgb A1c MFr Bld  Date/Time Value Ref Range Status  09/06/2018 07:32 AM 4.5 (L) 4.8 - 5.6 % Final    Comment:    (NOTE) Pre diabetes:          5.7%-6.4% Diabetes:              >6.4% Glycemic control for   <7.0% adults with diabetes   08/09/2018 11:59 AM 5.0 4.8 - 5.6 % Final    Comment:             Prediabetes: 5.7 - 6.4          Diabetes: >6.4          Glycemic control for adults with diabetes: <7.0     CBG: Recent Labs  Lab 10/14/18 1958 10/14/18 2355 10/15/18 0449 10/15/18 0745 10/15/18 1127  GLUCAP 133* 119* 250* 239* 166*    Recent Results (from the past 240 hour(s))  Culture, blood (routine x 2)     Status: None   Collection Time: 10/05/18  4:05 PM  Result Value Ref Range Status   Specimen Description BLOOD RIGHT HAND  Final   Special Requests   Final    BOTTLES DRAWN AEROBIC ONLY Blood Culture adequate volume   Culture   Final    NO GROWTH 5 DAYS Performed at [facility], [place] [address], [place], [place] [address]    Report Status 10/10/2018 FINAL  Final  Culture, blood (routine x 2)     Status: None   Collection Time: 10/05/18  4:21 PM  Result Value Ref Range Status   Specimen Description BLOOD RIGHT ARM  Final   Special Requests   Final    BOTTLES DRAWN AEROBIC ONLY Blood Culture adequate volume   Culture   Final    NO GROWTH 5 DAYS Performed at [facility], [address] [place], [place] [address]    Report Status 10/10/2018 FINAL  Final  C difficile quick scan w PCR reflex     Status: Abnormal   Collection Time: 10/06/18  7:07 PM  Result Value Ref Range Status   C Diff antigen POSITIVE (A) NEGATIVE Final   C  Diff toxin NEGATIVE NEGATIVE Final   C Diff interpretation Results are indeterminate. See PCR results.  Final    Comment: Performed at [facility], [place] [address], [place], [place] [address]  C. Diff by PCR, Reflexed     Status: Abnormal   Collection Time: 10/06/18  7:07 PM  Result Value  Ref Range Status   Toxigenic C. Difficile by PCR POSITIVE (A) NEGATIVE Final    Comment: Positive for toxigenic C. difficile with little to no toxin production. Only treat if clinical presentation suggests symptomatic illness. Performed at [facility], [place] [address] [place], [place] [address]      Scheduled Meds: . acetaminophen  650 mg Oral Q6H   Or  . acetaminophen (TYLENOL) oral liquid 160 mg/5 mL  650 mg Oral Q6H  . amiodarone  200 mg Per Tube Daily  . brimonidine  1 drop Left Eye BID   And  . timolol  1 drop Left Eye BID  . calcium acetate (Phos Binder)  667 mg Oral TID WC  . Chlorhexidine Gluconate Cloth  6 each Topical Q0600  . Chlorhexidine Gluconate Cloth  6 each Topical Q0600  . darbepoetin (ARANESP) injection - DIALYSIS  200 mcg Intravenous Q Sat-HD  . feeding supplement (PRO-STAT SUGAR FREE 64)  60 mL Oral BID  . hydrocortisone sodium succinate  50 mg Intravenous Q12H  . insulin aspart  0-9 Units Subcutaneous Q4H  . latanoprost  1 drop Both Eyes QHS  . pantoprazole (PROTONIX) IV  40 mg Intravenous QHS  . sodium chloride flush  3 mL Intravenous Q12H  . sodium hypochlorite   Irrigation q morning - 10a  . vancomycin  125 mg Per Tube QID  . zolpidem  5 mg Oral Once   Continuous Infusions: . sodium chloride Stopped (10/11/18 0902)  . feeding supplement (VITAL AF 1.2 CAL) 1,000 mL (10/15/18 0547)  . heparin 900 Units/hr (10/15/18 1037)     LOS: 21 days   Cherene Altes, MD [facility] Office  [phone] Pager - Text Page per Amion  If 7PM-7AM, please contact night-coverage per Amion 10/15/2018, 3:58 PM

## 2018-10-15 NOTE — Progress Notes (Addendum)
Physical Therapy Wound Treatment Patient Details  Name: [name] MRN: [id number] Date of Birth: 10/14/1945  Today's Date: 10/15/2018 Time: 5400-8676 Time Calculation (min): 47 min  Subjective  Subjective: "Is it going to hurt?" Patient and Family Stated Goals: none stated Date of Onset: (unsure) Prior Treatments: dressing change  Pain Score: Pt moaning some with pulse lavage, stating, "It's cold." Premedicated prior to session.   Wound Assessment  Pressure Injury 09/27/18 Unstageable - Full thickness tissue loss in which the base of the ulcer is covered by slough (yellow, tan, gray, green or brown) and/or eschar (tan, brown or black) in the wound bed. (Active)  Dressing Type ABD;Barrier Film (skin prep);Moist to dry;Paper tape;Gauze (Comment) 10/15/2018 11:25 AM  Dressing Changed;Clean;Intact;Dry 10/15/2018 11:25 AM  Dressing Change Frequency Daily 10/15/2018 11:25 AM  State of Healing Eschar 10/15/2018 11:25 AM  Site / Wound Assessment Red;Black 10/15/2018 11:25 AM  % Wound base Red or Granulating 10% 10/15/2018 11:25 AM  % Wound base Yellow/Fibrinous Exudate 0% 10/15/2018 11:25 AM  % Wound base Black/Eschar 90% 10/15/2018 11:25 AM  % Wound base Other/Granulation Tissue (Comment) 0% 10/15/2018 11:25 AM  Peri-wound Assessment Erythema (blanchable);Pink;Excoriated 10/15/2018 11:25 AM  Wound Length (cm) 9.5 cm 10/14/2018 11:00 AM  Wound Width (cm) 9.3 cm 10/14/2018 11:00 AM  Wound Depth (cm) 0.1 cm 10/14/2018 11:00 AM  Wound Surface Area (cm^2) 88.35 cm^2 10/14/2018 11:00 AM  Wound Volume (cm^3) 8.84 cm^3 10/14/2018 11:00 AM  Tunneling (cm) 0 10/06/2018  8:00 PM  Undermining (cm) 0 10/06/2018  8:00 PM  Margins Unattached edges (unapproximated) 10/15/2018 11:25 AM  Drainage Amount None 10/13/2018  8:00 PM  Drainage Description Sanguineous 10/15/2018 11:25 AM  Treatment Debridement (Selective);Hydrotherapy (Pulse lavage) 10/15/2018 11:25 AM     Hydrotherapy Pulsed lavage therapy - wound  location: sacrum Pulsed Lavage with Suction (psi): 12 psi Pulsed Lavage with Suction - Normal Saline Used: 1000 mL Pulsed Lavage Tip: Tip with splash shield Selective Debridement Selective Debridement - Location: sacrum Selective Debridement - Tools Used: Scalpel Selective Debridement - Tissue Removed: scored adherent eschar   Wound Assessment and Plan  Wound Therapy - Assess/Plan/Recommendations Wound Therapy - Clinical Statement: Noted blue drainage and odor; notified [place] RN. Pt with slow removal/softening of eschar. Will benefift from hydrotherapy for removal of nonviable tissue. Wound Therapy - Functional Problem List: Decr mobililty, decr sitting balance Factors Delaying/Impairing Wound Healing: Diabetes Mellitus;Immobility;Multiple medical problems Hydrotherapy Plan: Debridement;Dressing change;Patient/family education;Pulsatile lavage with suction Wound Therapy - Frequency: 6X / week Wound Therapy - Follow Up Recommendations: Skilled nursing facility Wound Plan: See above  Wound Therapy Goals- Improve the function of patient's integumentary system by progressing the wound(s) through the phases of wound healing (inflammation - proliferation - remodeling) by: Decrease Necrotic Tissue to: 80 Decrease Necrotic Tissue - Progress: Progressing toward goal Increase Granulation Tissue to: 20 Increase Granulation Tissue - Progress: Progressing toward goal Goals/treatment plan/discharge plan were made with and agreed upon by patient/family: Yes Time For Goal Achievement: 7 days Wound Therapy - Potential for Goals: Fair  Goals will be updated until maximal potential achieved or discharge criteria met.  Discharge criteria: when goals achieved, discharge from hospital, MD decision/surgical intervention, no progress towards goals, refusal/missing three consecutive treatments without notification or medical reason.  GP    Ellamae Sia, PT, DPT Acute Rehabilitation Services Pager  [phone] Office [phone]   Willy Eddy 10/15/2018, 11:29 AM

## 2018-10-15 NOTE — Progress Notes (Signed)
[place] for Heparin (Apixaban on hold) Indication: atrial fibrillation / DVT  Allergies  Allergen Reactions  . Ace Inhibitors Cough  . Fentanyl     Fentanyl PATCH causes lethargy, somnolence, and AMS. Tolerated IV Fentanyl in small increments.    Patient Measurements: TBW 56 kg Heparin Dosing Weight: TBW  Vital Signs: Temp: 98.7 F (37.1 C) (01/17 0755) Temp Source: Tympanic (01/17 0755) BP: 141/25 (01/17 0755) Pulse Rate: 83 (01/17 0755)  Labs: Recent Labs    10/13/18 0231 10/13/18 1356 10/14/18 0308 10/15/18 1124  HGB 9.2*  --  8.8*  --   HCT 30.5*  --  30.0*  --   PLT 252  --  291  --   HEPARINUNFRC 0.25* 0.59 0.27* 0.31  CREATININE 2.26*  --  3.27*  --     Estimated Creatinine Clearance: 11.2 mL/min (A) (by C-G formula based on SCr of 3.27 mg/dL (H)).  Assessment: 73 yo F presents with wound infection. On Eliquis PTA for DVT and Afib- last dose was 1/4 at 1000.   Eliquis held due to suspicion for GIB.  Pharmacy asked to restart anticoagulation with IV heparin.  Heparin level within goal range today. No bleeding or complications noted.   Goal of Therapy:  Heparin level 0.3-0.7 units/ml Monitor platelets by anticoagulation protocol: Yes   Plan:  Continue heparin drip at 900 units/hr. Daily heparin level and CBC F/u ability to resume enteral anticoagulation eventually. (plans for HD access?)  Alanda Slim, PharmD, [facility] Clinical Pharmacist Please see AMION for all Pharmacists' Contact Phone Numbers 10/15/2018, 12:16 PM

## 2018-10-15 NOTE — Progress Notes (Signed)
Subjective:  - HD yest- removed 1100- fairly stable overnight - very deconditioned- large sacral wound- wants ice   Objective Vital signs in last 24 hours: Vitals:   10/14/18 2356 10/15/18 0451 10/15/18 0500 10/15/18 0755  BP: (!) 114/24 (!) 122/27  (!) 141/25  Pulse: 77 91  83  Resp: 14 19  (!) 21  Temp: 98.9 F (37.2 C) 98.5 F (36.9 C)  98.7 F (37.1 C)  TempSrc: Oral Oral  Tympanic  SpO2: 100% 95%  98%  Weight:   48.5 kg   Height:       Weight change: -0.9 kg  Intake/Output Summary (Last 24 hours) at 10/15/2018 1030 Last data filed at 10/14/2018 2300 Gross per 24 hour  Intake 293.01 ml  Output 1100 ml  Net -806.99 ml   Dialysis Orders:  [facility] TTS  3.5h 61kg 400/800 Hep 1800 R TDC Mircera 225 q2weeks (last 09/14/18)   Assessment/ Plan: Pt is a 73 y.o. yo female ESRD who was admitted on 09/24/2018 with FTT after AKA culminating with PNA and respiratory failure req vent Assessment/Plan: 1. Pulm/ID - PNA and resp failure-  - extubated- sat 100%- WBC still up.  Now also C diff- on vanc  2. ESRD - normally TTS via TDC- cont routine- next tx tomorrow 3. Anemia- req transfusion this hosp- on max darbe- no iron right now 4. Secondary hyperparathyroidism- phoslyra ordered, phos steady and not her biggest concern right now - no recent PTH data here 5. HTN/volume- BP stable- no BP meds- volume status slightly heavy- remove volume as able with HD 6. FTT- low albumin/decub that is large - has had palliative discussion but family requesting continued max efforts, cont with these discussions -difficult because is alert - FYI I do not think renal mass is of clinical significance- would not chase down either.  Appreciate case management assist- yes she does need to be able to sit up for OP HD which she is not strong enough to do right now   [place]: Basic Metabolic Panel: Recent Labs  Lab 10/12/18 0318 10/13/18 0231 10/14/18 0308  NA 136 133* 129*  K 3.2*  4.3 4.1  CL 101 96* 95*  CO2 19* 23 19*  GLUCOSE 88 191* 261*  BUN 85* 23 41*  CREATININE 4.73* 2.26* 3.27*  CALCIUM 8.3* 8.2* 8.3*  PHOS 7.2* 5.2* 7.6*   Liver Function Tests: Recent Labs  Lab 10/12/18 0318 10/13/18 0231 10/14/18 0308  AST 30  --   --   ALT 22  --   --   ALKPHOS 80  --   --   BILITOT 0.7  --   --   PROT 5.6*  --   --   ALBUMIN 1.5* 1.5* 1.5*   No results for input(s): LIPASE, AMYLASE in the last 168 hours. No results for input(s): AMMONIA in the last 168 hours. CBC: Recent Labs  Lab 10/11/18 0409 10/11/18 1214 10/12/18 0318 10/13/18 0231 10/14/18 0308  WBC 17.3* 20.6* 17.9* 16.4* 20.2*  HGB 6.6* 8.2* 8.3* 9.2* 8.8*  HCT 22.9* 27.1* 27.6* 30.5* 30.0*  MCV 94.2 91.6 91.7 92.1 92.0  PLT 285 261 281 252 291   Cardiac Enzymes: No results for input(s): CKTOTAL, CKMB, CKMBINDEX, TROPONINI in the last 168 hours. CBG: Recent Labs  Lab 10/14/18 1654 10/14/18 1958 10/14/18 2355 10/15/18 0449 10/15/18 0745  GLUCAP 122* 133* 119* 250* 239*    Iron Studies:  No results for input(s): IRON, TIBC,  TRANSFERRIN, FERRITIN in the last 72 hours. Studies/Results: No results found. Medications: Infusions: . sodium chloride Stopped (10/11/18 0902)  . feeding supplement (VITAL AF 1.2 CAL) 1,000 mL (10/15/18 0547)  . heparin 900 Units/hr (10/14/18 1700)    Scheduled Medications: . acetaminophen  650 mg Oral Q6H   Or  . acetaminophen (TYLENOL) oral liquid 160 mg/5 mL  650 mg Oral Q6H  . amiodarone  200 mg Per Tube Daily  . brimonidine  1 drop Left Eye BID   And  . timolol  1 drop Left Eye BID  . calcium acetate (Phos Binder)  667 mg Oral TID WC  . Chlorhexidine Gluconate Cloth  6 each Topical Q0600  . collagenase   Topical Daily  . darbepoetin (ARANESP) injection - DIALYSIS  200 mcg Intravenous Q Sat-HD  . feeding supplement (PRO-STAT SUGAR FREE 64)  60 mL Oral BID  . hydrocortisone sodium succinate  50 mg Intravenous Q12H  . insulin aspart  0-9 Units  Subcutaneous Q4H  . latanoprost  1 drop Both Eyes QHS  . pantoprazole (PROTONIX) IV  40 mg Intravenous QHS  . sodium chloride flush  3 mL Intravenous Q12H  . vancomycin  125 mg Per Tube QID  . zolpidem  5 mg Oral Once    have reviewed scheduled and prn medications.  Physical Exam: General: soft spoken- remembers me - frail  Heart: RRR Lungs: mostly clear Abdomen: soft, non tender Extremities: min edema- tender to touch- bilat AKA Dialysis Access: right sided TDC     10/15/2018,10:30 AM  LOS: 21 days

## 2018-10-15 NOTE — Progress Notes (Signed)
Physical Therapy Treatment Patient Details Name: [name] MRN: [id number] DOB: 19-Apr-1946 Today's Date: 10/15/2018    History of Present Illness [name] is a 73 y.o. female with medical history significant of hypertension, hyperlipidemia, diabetes mellitus, COPD, GERD, gout, and anemia, atrial fibrillation and DVT on Eliquis, ESRD-HD (TTS), lupus, PAD, R AKA (05/2018), now s/p L AKA. Pt with AMS and lethargy 10/01/18 transferred to ICU and intubated. Pt also with unstageable sacral injury    PT Comments    Pt significantly limited by buttock pain when attempting to sit upright on bed. Unable to tolerate more than 30 sec at a time. Pt is HD pt and will need to sit > 4 hrs for HD to dc. Doubtful this is realistic to happen soon. Arranged to get amputee sling for maximove to pt's room so can begin attempting OOB.   Follow Up Recommendations  SNF;LTACH     Equipment Recommendations  Other (comment)(defer)    Recommendations for Other Services       Precautions / Restrictions Precautions Precautions: Fall Precaution Comments:  flexiseal Restrictions Other Position/Activity Restrictions: pt s/p B AKA    Mobility  Bed Mobility Overal bed mobility: Needs Assistance Bed Mobility: Rolling;Supine to Sit;Sit to Supine Rolling: +2 for physical assistance;Total assist   Supine to sit: +2 for physical assistance;Total assist Sit to supine: +2 for physical assistance;Total assist   General bed mobility comments: Assist for all aspects. Pt assisting pulling up to sit with UE's. Pt performed this x 10. Unable to tolerate sitting upright for more than 30 sec due to buttock pain.   Transfers                    Ambulation/Gait                 Stairs             Wheelchair Mobility    Modified Rankin (Stroke Patients Only)       Balance Overall balance assessment: Needs assistance Sitting-balance support: Bilateral upper extremity  supported Sitting balance-Leahy Scale: Poor Sitting balance - Comments: +2 mod assist to maintain upright with BUE support. Postural control: Posterior lean                                  Cognition Arousal/Alertness: Awake/alert Behavior During Therapy: Flat affect Overall Cognitive Status: Impaired/Different from baseline Area of Impairment: Attention;Following commands;Awareness                   Current Attention Level: Sustained Memory: Decreased short-term memory Following Commands: Follows one step commands with increased time   Awareness: Emergent          Exercises      General Comments        Pertinent Vitals/Pain Pain Assessment: Faces Faces Pain Scale: Hurts whole lot Pain Location: buttocks Pain Descriptors / Indicators: Grimacing Pain Intervention(s): Monitored during session;Repositioned    Home Living                      Prior Function            PT Goals (current goals can now be found in the care plan section) Progress towards PT goals: Not progressing toward goals - comment(buttock pain)    Frequency    Min 2X/week      PT Plan Current plan remains appropriate  Co-evaluation              AM-PAC PT "6 Clicks" Mobility   Outcome Measure  Help needed turning from your back to your side while in a flat bed without using bedrails?: Total Help needed moving from lying on your back to sitting on the side of a flat bed without using bedrails?: Total Help needed moving to and from a bed to a chair (including a wheelchair)?: Total Help needed standing up from a chair using your arms (e.g., wheelchair or bedside chair)?: Total Help needed to walk in hospital room?: Total Help needed climbing 3-5 steps with a railing? : Total 6 Click Score: 6    End of Session   Activity Tolerance: Patient limited by pain Patient left: in bed;with call bell/phone within reach;with family/visitor present Nurse  Communication: Mobility status;Need for lift equipment PT Visit Diagnosis: Other abnormalities of gait and mobility (R26.89);Muscle weakness (generalized) (M62.81);Difficulty in walking, not elsewhere classified (R26.2);Pain Pain - part of body: (buttocks)     Time: 7943-2761 PT Time Calculation (min) (ACUTE ONLY): 14 min  Charges:  $Therapeutic Activity: 8-22 mins                     [place] Pager [phone] Office [place] 10/15/2018, 3:47 PM

## 2018-10-15 NOTE — Consult Note (Signed)
WOC follow-up: Physical therapy reported blue drainage and strong odor during hydrotherapy today.   Plan: discontinue Santyl and begin 5 days of Dakins solution to provide antimicrobial benefits and further debridement of nonviable tissue. [place] team will reassess next Wed. Julien Girt MSN, RN, [place], [place], [place]

## 2018-10-16 LAB — RENAL FUNCTION PANEL
Albumin: 1.6 g/dL — ABNORMAL LOW (ref 3.5–5.0)
Albumin: 1.6 g/dL — ABNORMAL LOW (ref 3.5–5.0)
Anion gap: 13 (ref 5–15)
Anion gap: 12 (ref 5–15)
BUN: 25 mg/dL — ABNORMAL HIGH (ref 8–23)
BUN: 38 mg/dL — ABNORMAL HIGH (ref 8–23)
CHLORIDE: 98 mmol/L (ref 98–111)
CO2: 25 mmol/L (ref 22–32)
CO2: 22 mmol/L (ref 22–32)
Calcium: 8.1 mg/dL — ABNORMAL LOW (ref 8.9–10.3)
Calcium: 8.2 mg/dL — ABNORMAL LOW (ref 8.9–10.3)
Chloride: 97 mmol/L — ABNORMAL LOW (ref 98–111)
Creatinine, Ser: 2.11 mg/dL — ABNORMAL HIGH (ref 0.44–1.00)
Creatinine, Ser: 2.88 mg/dL — ABNORMAL HIGH (ref 0.44–1.00)
GFR calc Af Amer: 26 mL/min — ABNORMAL LOW (ref >60)
GFR calc Af Amer: 18 mL/min — ABNORMAL LOW (ref >60)
GFR calc non Af Amer: 23 mL/min — ABNORMAL LOW (ref >60)
GFR calc non Af Amer: 16 mL/min — ABNORMAL LOW (ref >60)
Glucose, Bld: 190 mg/dL — ABNORMAL HIGH (ref 70–99)
Glucose, Bld: 243 mg/dL — ABNORMAL HIGH (ref 70–99)
POTASSIUM: 2.9 mmol/L — AB (ref 3.5–5.1)
Phosphorus: 4 mg/dL (ref 2.5–4.6)
Phosphorus: 4.5 mg/dL (ref 2.5–4.6)
Potassium: 3.3 mmol/L — ABNORMAL LOW (ref 3.5–5.1)
Sodium: 136 mmol/L (ref 135–145)
Sodium: 131 mmol/L — ABNORMAL LOW (ref 135–145)

## 2018-10-16 LAB — GLUCOSE, CAPILLARY
GLUCOSE-CAPILLARY: 237 mg/dL — AB (ref 70–99)
GLUCOSE-CAPILLARY: 173 mg/dL — AB (ref 70–99)
GLUCOSE-CAPILLARY: 122 mg/dL — AB (ref 70–99)
Glucose-Capillary: 155 mg/dL — ABNORMAL HIGH (ref 70–99)
Glucose-Capillary: 174 mg/dL — ABNORMAL HIGH (ref 70–99)
Glucose-Capillary: 192 mg/dL — ABNORMAL HIGH (ref 70–99)

## 2018-10-16 LAB — CBC
HCT: 31.3 % — ABNORMAL LOW (ref 36.0–46.0)
Hemoglobin: 9.5 g/dL — ABNORMAL LOW (ref 12.0–15.0)
MCH: 28.4 pg (ref 26.0–34.0)
MCHC: 30.4 g/dL (ref 30.0–36.0)
MCV: 93.7 fL (ref 80.0–100.0)
Platelets: 208 10*3/uL (ref 150–400)
RBC: 3.34 MIL/uL — ABNORMAL LOW (ref 3.87–5.11)
RDW: 22.4 % — ABNORMAL HIGH (ref 11.5–15.5)
WBC: 16.7 10*3/uL — ABNORMAL HIGH (ref 4.0–10.5)
nRBC: 0 % (ref 0.0–0.2)

## 2018-10-16 LAB — HEPARIN LEVEL (UNFRACTIONATED): Heparin Unfractionated: 0.37 IU/mL (ref 0.30–0.70)

## 2018-10-16 MED ORDER — HEPARIN SODIUM (PORCINE) 1000 UNIT/ML IJ SOLN
INTRAMUSCULAR | Status: AC
  Filled 2018-10-16: qty 4

## 2018-10-16 MED ORDER — HEPARIN SODIUM (PORCINE) 1000 UNIT/ML IJ SOLN: 3200.0000 [IU] | Freq: Once | INTRAMUSCULAR | Status: DC

## 2018-10-16 MED ORDER — DARBEPOETIN ALFA 200 MCG/0.4ML IJ SOSY
PREFILLED_SYRINGE | INTRAMUSCULAR | Status: AC
  Filled 2018-10-16: qty 0.4

## 2018-10-16 MED ORDER — ALBUMIN HUMAN 25 % IV SOLN
INTRAVENOUS | Status: AC
  Administered 2018-10-16: 25 g
  Filled 2018-10-16: qty 100

## 2018-10-16 NOTE — Progress Notes (Signed)
Subjective:  - stable overnight- due for HD today - was given permission to take in ice and is happy about that Objective Vital signs in last 24 hours: Vitals:   10/15/18 1400 10/15/18 2005 10/15/18 2314 10/16/18 0350  BP: (!) 129/25 (!) 132/32 (!) 139/37 (!) 151/36  Pulse: 78 78  80  Resp: 18 16 14 17   Temp: (!) 97 F (36.1 C) 98.6 F (37 C) 98.4 F (36.9 C) 98 F (36.7 C)  TempSrc: Axillary Oral Oral Oral  SpO2: 100% 98% 98% 100%  Weight:    48.5 kg  Height:       Weight change: -6.565 kg  Intake/Output Summary (Last 24 hours) at 10/16/2018 0804 Last data filed at 10/15/2018 2317 Gross per 24 hour  Intake 230 ml  Output 2 ml  Net 228 ml   Dialysis Orders:  [facility] TTS  3.5h 61kg 400/800 Hep 1800 R TDC Mircera 225 q2weeks (last 09/14/18)   Assessment/ Plan: Pt is a 73 y.o. yo female ESRD who was admitted on 09/24/2018 with FTT after AKA culminating with PNA and respiratory failure req vent Assessment/Plan: 1. Pulm/ID - PNA and resp failure-  - extubated- sat 100%- WBC still up.  Now also C diff- on PO vanc  2. ESRD - normally TTS via TDC- cont routine- next tx today 3. Anemia- req transfusion this hosp- on max darbe- no iron right now 4. Secondary hyperparathyroidism- phoslyra ordered, phos now within goal - no recent PTH data here 5. HTN/volume- BP stable- no BP meds- volume status slightly heavy- remove volume as able with HD 6. FTT- low albumin/decub that is large - has had palliative discussion but family requesting continued max efforts, cont with these discussions -difficult because is alert - FYI I do not think renal mass is of clinical significance- would not chase down either.  Appreciate case management assist- yes she does need to be able to sit up for OP HD which she is not strong enough to do right now - possible LTACH candidate ?   Louis Meckel    Labs: Basic Metabolic Panel: Recent Labs  Lab 10/14/18 0308 10/15/18 1124 10/16/18 0325  NA  129* 136 131*  K 4.1 2.9* 3.3*  CL 95* 98 97*  CO2 19* 25 22  GLUCOSE 261* 190* 243*  BUN 41* 25* 38*  CREATININE 3.27* 2.11* 2.88*  CALCIUM 8.3* 8.1* 8.2*  PHOS 7.6* 4.0 4.5   Liver Function Tests: Recent Labs  Lab 10/12/18 0318  10/14/18 0308 10/15/18 1124 10/16/18 0325  AST 30  --   --   --   --   ALT 22  --   --   --   --   ALKPHOS 80  --   --   --   --   BILITOT 0.7  --   --   --   --   PROT 5.6*  --   --   --   --   ALBUMIN 1.5*   < > 1.5* 1.6* 1.6*   < > = values in this interval not displayed.   No results for input(s): LIPASE, AMYLASE in the last 168 hours. No results for input(s): AMMONIA in the last 168 hours. CBC: Recent Labs  Lab 10/12/18 0318 10/13/18 0231 10/14/18 0308 10/15/18 1124 10/16/18 0325  WBC 17.9* 16.4* 20.2* 17.7* 16.7*  HGB 8.3* 9.2* 8.8* 9.5* 9.5*  HCT 27.6* 30.5* 30.0* 31.9* 31.3*  MCV 91.7 92.1 92.0 94.7 93.7  PLT  281 252 291 230 208   Cardiac Enzymes: No results for input(s): CKTOTAL, CKMB, CKMBINDEX, TROPONINI in the last 168 hours. CBG: Recent Labs  Lab 10/15/18 1127 10/15/18 1627 10/15/18 2025 10/16/18 0010 10/16/18 0338  GLUCAP 166* 189* 192* 192* 237*    Iron Studies:  No results for input(s): IRON, TIBC, TRANSFERRIN, FERRITIN in the last 72 hours. Studies/Results: No results found. Medications: Infusions: . sodium chloride Stopped (10/11/18 0902)  . feeding supplement (VITAL AF 1.2 CAL) 1,000 mL (10/15/18 0547)  . heparin 900 Units/hr (10/15/18 1037)    Scheduled Medications: . acetaminophen (TYLENOL) oral liquid 160 mg/5 mL  650 mg Per Tube Q6H  . amiodarone  200 mg Per Tube Daily  . brimonidine  1 drop Left Eye BID   And  . timolol  1 drop Left Eye BID  . calcium acetate (Phos Binder)  667 mg Per Tube TID  . Chlorhexidine Gluconate Cloth  6 each Topical Q0600  . Chlorhexidine Gluconate Cloth  6 each Topical Q0600  . darbepoetin (ARANESP) injection - DIALYSIS  200 mcg Intravenous Q Sat-HD  . feeding  supplement (PRO-STAT SUGAR FREE 64)  60 mL Per Tube BID  . insulin aspart  0-9 Units Subcutaneous Q4H  . latanoprost  1 drop Both Eyes QHS  . pantoprazole (PROTONIX) IV  40 mg Intravenous QHS  . predniSONE  20 mg Per Tube Q breakfast  . sodium chloride flush  3 mL Intravenous Q12H  . sodium hypochlorite   Irrigation q morning - 10a  . vancomycin  125 mg Per Tube QID    have reviewed scheduled and prn medications.  Physical Exam: General: soft spoken- - frail  Heart: RRR Lungs: mostly clear Abdomen: soft, non tender Extremities: min edema- tender to touch- bilat AKA Dialysis Access: right sided TDC     10/16/2018,8:04 AM  LOS: 22 days

## 2018-10-16 NOTE — Progress Notes (Signed)
  Speech Language Pathology Treatment: Dysphagia  Patient Details Name: [name] MRN: [id number] DOB: 08/12/1946 Today's Date: 10/16/2018 Time: 0840-0900 SLP Time Calculation (min) (ACUTE ONLY): 20 min  Assessment / Plan / Recommendation Clinical Impression  Skilled observation of intake with ice chips/tsp water with immediate weak cough noted with tsp water; pt did not exhibit any overt s/s of aspiration after oral care/intake of ice chips; vocal quality improved with low vocal intensity, but voice consistent (no signs of aphonia) throughout PO trial; pharyngeal dysphagia persists with NPO status recommended to continue until pt is a candidate for objective testing and potential diet upgrade recommendation based on safety/efficiency of swallow; continue PO readiness trials with ST with TF primary source of nutrition/hydration; ST will continue efforts while in acute setting.  HPI HPI: Pt is a 73 y.o. female admitted with progressive gangrene of her left foot and is status post AKA on 09/26/2018. She has demonstrated poor p.o. intake, oral thush, as well as " significant difficulty with secretion clearance, evolving poor cough and increased work of breathing in addition to malnutrition". She developed encephalopathy, was unable to clear airway secretions and required intubation on 1/3 with extubation on 1/13. Nursing reported that NG tube placement was attempted four times without success.  Pt now with Cortrak. Voice largely aphonic initially, but improving.      SLP Plan  Continue with current plan of care       Recommendations  Diet recommendations: NPO Medication Administration: Via alternative means Compensations: Slow rate;Small sips/bites                Oral Care Recommendations: Oral care prior to ice chip/H20 Follow up Recommendations: Skilled Nursing facility SLP Visit Diagnosis: Dysphagia, pharyngeal phase (R13.13) Plan: Continue with current plan of care                      Elvina Sidle, M.S., CCC-SLP 10/16/2018, 10:47 AM

## 2018-10-16 NOTE — Progress Notes (Signed)
[place] for Heparin (Apixaban on hold) Indication: atrial fibrillation / DVT  Allergies  Allergen Reactions  . Ace Inhibitors Cough  . Fentanyl     Fentanyl PATCH causes lethargy, somnolence, and AMS. Tolerated IV Fentanyl in small increments.    Patient Measurements: TBW 56 kg Heparin Dosing Weight: TBW  Vital Signs: Temp: 97.5 F (36.4 C) (01/18 0815) Temp Source: Oral (01/18 0815) BP: 176/58 (01/18 0815) Pulse Rate: 87 (01/18 0815)  Labs: Recent Labs    10/14/18 0308 10/15/18 1124 10/16/18 0325  HGB 8.8* 9.5* 9.5*  HCT 30.0* 31.9* 31.3*  PLT 291 230 208  HEPARINUNFRC 0.27* 0.31 0.37  CREATININE 3.27* 2.11* 2.88*    Estimated Creatinine Clearance: 12.7 mL/min (A) (by C-G formula based on SCr of 2.88 mg/dL (H)).  Assessment: 73 yo F presents with wound infection. On Eliquis PTA for DVT and Afib- last dose was 1/4 at 1000. Eliquis held due to suspicion for GIB.  Pharmacy asked to restart anticoagulation with IV heparin.  Heparin therapeutic at 0.37, H/H stable.  Goal of Therapy:  Heparin level 0.3-0.7 units/ml Monitor platelets by anticoagulation protocol: Yes   Plan:  -Continue heparin drip at 900 units/hr. -Daily heparin level and CBC -F/u ability to resume enteral anticoagulation eventually. (plans for HD access?)  Arrie Senate, PharmD, BCPS Clinical Pharmacist [phone] Please check AMION for all [place] numbers 10/16/2018

## 2018-10-16 NOTE — Progress Notes (Signed)
   10/16/18 1300  Clinical Encounter Type  Visited With Patient and family together  Visit Type Initial  Responded to [facility] consult for AD. Gave and explained AD documents. Can get notarized on Monday. Nurse please put in another consult for spiritual are on Monday morning. Thanks

## 2018-10-16 NOTE — Progress Notes (Signed)
[facility] TEAM 1 - Stepdown/ICU TEAM  NIKEISHA KLUTZ  DPO:[id number] DOB: [birth year] DOA: 09/24/2018 PCP: Sharilyn Sites, MD    Brief Narrative:  73 year old woman with ESRD, peripheral vascular disease, diabetes, hypertension with diastolic dysfunction, left AKA 09/26/2018 for LLE ischemia and metatarsal wound. Poor p.o. intake, progressive weakness, A. fib since the procedure. She was later noted to have lethargy and difficulty with airway protection with pooling secretions and increased work of breathing. PCCM consulted for evolving resp failure.  She was intubated on 10/01/2018 and extubated on 10/11/2018 and transferred to the Hospitalist service.  Significant Events: 12/27 admission with left lower extremity gangrenous wound 12/29 left AKA 1/3 transfer to ICU and intubated 1/4 OGT with bright red blood 1/13 ETT removed  1/15 Plastic surgery consulted for large sacral decubitus wound  Subjective: The patient is seen in hemodialysis.  She denies chest pain shortness of breath fevers or chills.  She tells me she is anxious to get involved in rehabilitation.  She has no new complaints today.  Assessment & Plan:  Septic shock and Acute hypoxic resp failure due to ESBL E. coli pneumonia  Completed 8 day course merem - repeat blood cultures 1/9 negative - clinically stable on RA presently   C diff colitis  oral vancomycin 125mg  QID - flexiseal in place   Acute on Chronic Anemia - UGIB in setting of anticoagulation likely secondary to NG tube trauma - chronic anemia likely due to ESRD and chronic disease - s/p 1U PRBC this admit   Chronic right-sided abdominal pain Stable at present  End-stage renal disease - HD TTS Care per Nephrology   Recent L AKA for ischemia/gangrene - prior R AKA - PVD continue wound care  Large sacral decubitus ulcer WOC team following - Plastic Surgery consulted   Hypertension Blood pressure poorly controlled -follow postdialysis  today  Chronic diastolic CHF Volume management per hemodialysis  Paroxysmal atrial fibrillation Continue Amiodarone - heparin drip until clear no further active bleeding / holding apixaban   DM2 CBG reasonably controlled today -follow without change  SLE Holding Plaquenil given concern for sepsis -transition to prednisone with plan to wean to home dose  3 cm right upper pole renal mass of unclear significance per Nephrology do not think renal mass is of clinical significance  COPD  Well compensated  DVT prophylaxis: heparin per pharmacy  Code Status: FULL CODE Family Communication:  Disposition Plan:   Consultants:  Vascular Surgery Nephrology Plastic Surgery  Antimicrobials:  Oral vanc 1/9 >  Objective: Blood pressure (!) 176/63, pulse 70, temperature 98.4 F (36.9 C), temperature source Oral, resp. rate 14, height 5' (1.524 m), weight 48.5 kg, SpO2 98 %.  Intake/Output Summary (Last 24 hours) at 10/16/2018 1628 Last data filed at 10/16/2018 1200 Gross per 24 hour  Intake 233 ml  Output 2 ml  Net 231 ml   Filed Weights   10/14/18 1615 10/15/18 0500 10/16/18 0350  Weight: 54 kg 48.5 kg 48.5 kg    Examination: General: No acute respiratory distress Lungs: Clear to auscultation throughout with no wheezing Cardiovascular: Regular rate without murmur or rub Abdomen: Nontender, nondistended, soft, bowel sounds positive, no rebound Extremities: Dressed with no acute findings  CBC: Recent Labs  Lab 10/14/18 0308 10/15/18 1124 10/16/18 0325  WBC 20.2* 17.7* 16.7*  HGB 8.8* 9.5* 9.5*  HCT 30.0* 31.9* 31.3*  MCV 92.0 94.7 93.7  PLT 291 230 431   Basic Metabolic Panel: Recent Labs  Lab 10/12/18 0318 10/13/18  0231 10/14/18 0308 10/15/18 1124 10/16/18 0325  NA 136 133* 129* 136 131*  K 3.2* 4.3 4.1 2.9* 3.3*  CL 101 96* 95* 98 97*  CO2 19* 23 19* 25 22  GLUCOSE 88 191* 261* 190* 243*  BUN 85* 23 41* 25* 38*  CREATININE 4.73* 2.26* 3.27* 2.11*  2.88*  CALCIUM 8.3* 8.2* 8.3* 8.1* 8.2*  MG 2.0 1.9  --   --   --   PHOS 7.2* 5.2* 7.6* 4.0 4.5   GFR: Estimated Creatinine Clearance: 12.7 mL/min (A) (by C-G formula based on SCr of 2.88 mg/dL (H)).  Liver Function Tests: Recent Labs  Lab 10/12/18 0318 10/13/18 0231 10/14/18 0308 10/15/18 1124 10/16/18 0325  AST 30  --   --   --   --   ALT 22  --   --   --   --   ALKPHOS 80  --   --   --   --   BILITOT 0.7  --   --   --   --   PROT 5.6*  --   --   --   --   ALBUMIN 1.5* 1.5* 1.5* 1.6* 1.6*    HbA1C: Hgb A1c MFr Bld  Date/Time Value Ref Range Status  09/06/2018 07:32 AM 4.5 (L) 4.8 - 5.6 % Final    Comment:    (NOTE) Pre diabetes:          5.7%-6.4% Diabetes:              >6.4% Glycemic control for   <7.0% adults with diabetes   08/09/2018 11:59 AM 5.0 4.8 - 5.6 % Final    Comment:             Prediabetes: 5.7 - 6.4          Diabetes: >6.4          Glycemic control for adults with diabetes: <7.0     CBG: Recent Labs  Lab 10/15/18 2025 10/16/18 0010 10/16/18 0338 10/16/18 0811 10/16/18 1138  GLUCAP 192* 192* 237* 174* 173*    Recent Results (from the past 240 hour(s))  C difficile quick scan w PCR reflex     Status: Abnormal   Collection Time: 10/06/18  7:07 PM  Result Value Ref Range Status   C Diff antigen POSITIVE (A) NEGATIVE Final   C Diff toxin NEGATIVE NEGATIVE Final   C Diff interpretation Results are indeterminate. See PCR results.  Final    Comment: Performed at [facility], [place] [address] [place], [place] [address]  C. Diff by PCR, Reflexed     Status: Abnormal   Collection Time: 10/06/18  7:07 PM  Result Value Ref Range Status   Toxigenic C. Difficile by PCR POSITIVE (A) NEGATIVE Final    Comment: Positive for toxigenic C. difficile with little to no toxin production. Only treat if clinical presentation suggests symptomatic illness. Performed at [facility], [place] [address], [place], [place] [address]      Scheduled  Meds: . acetaminophen (TYLENOL) oral liquid 160 mg/5 mL  650 mg Per Tube Q6H  . amiodarone  200 mg Per Tube Daily  . brimonidine  1 drop Left Eye BID   And  . timolol  1 drop Left Eye BID  . calcium acetate (Phos Binder)  667 mg Per Tube TID  . Chlorhexidine Gluconate Cloth  6 each Topical Q0600  . Chlorhexidine Gluconate Cloth  6 each Topical Q0600  .  darbepoetin (ARANESP) injection - DIALYSIS  200 mcg Intravenous Q Sat-HD  . feeding supplement (PRO-STAT SUGAR FREE 64)  60 mL Per Tube BID  . insulin aspart  0-9 Units Subcutaneous Q4H  . latanoprost  1 drop Both Eyes QHS  . pantoprazole (PROTONIX) IV  40 mg Intravenous QHS  . predniSONE  20 mg Per Tube Q breakfast  . sodium chloride flush  3 mL Intravenous Q12H  . sodium hypochlorite   Irrigation q morning - 10a  . vancomycin  125 mg Per Tube QID   Continuous Infusions: . sodium chloride Stopped (10/11/18 0902)  . albumin human    . feeding supplement (VITAL AF 1.2 CAL) 1,000 mL (10/15/18 0547)  . heparin 900 Units/hr (10/15/18 1037)     LOS: 13 days   Cherene Altes, MD [facility] Office  [phone] Pager - Text Page per Amion  If 7PM-7AM, please contact night-coverage per Amion 10/16/2018, 4:28 PM

## 2018-10-16 NOTE — Progress Notes (Signed)
Physical Therapy Wound Treatment Patient Details  Name: [name] MRN: [id number] Date of Birth: [birth year]  Today's Date: 10/16/2018 Time: 1012-1050 Time Calculation (min): 38 min  Subjective  Subjective: "I don't want it to hurt." Patient and Family Stated Goals: none stated Date of Onset: (unsure) Prior Treatments: dressing change  Pain Score:  Pt with occasional moaning during pulsatile lavage; Pre medicated prior to session  Wound Assessment  Pressure Injury 09/27/18 Unstageable - Full thickness tissue loss in which the base of the ulcer is covered by slough (yellow, tan, gray, green or brown) and/or eschar (tan, brown or black) in the wound bed. (Active)  Dressing Type ABD;Barrier Film (skin prep);Moist to dry;Paper tape;Gauze (Comment) 10/16/2018 11:53 AM  Dressing Changed;Clean;Intact;Dry 10/16/2018 11:53 AM  Dressing Change Frequency Daily 10/16/2018 11:53 AM  State of Healing Eschar 10/16/2018 11:53 AM  Site / Wound Assessment Red;Black 10/16/2018 11:53 AM  % Wound base Red or Granulating 10% 10/16/2018 11:53 AM  % Wound base Yellow/Fibrinous Exudate 0% 10/16/2018 11:53 AM  % Wound base Black/Eschar 90% 10/16/2018 11:53 AM  % Wound base Other/Granulation Tissue (Comment) 0% 10/16/2018 11:53 AM  Peri-wound Assessment Erythema (blanchable);Pink;Excoriated 10/16/2018 11:53 AM  Wound Length (cm) 9.5 cm 10/14/2018 11:00 AM  Wound Width (cm) 9.3 cm 10/14/2018 11:00 AM  Wound Depth (cm) 0.1 cm 10/14/2018 11:00 AM  Wound Surface Area (cm^2) 88.35 cm^2 10/14/2018 11:00 AM  Wound Volume (cm^3) 8.84 cm^3 10/14/2018 11:00 AM  Tunneling (cm) 0 10/06/2018  8:00 PM  Undermining (cm) 0 10/06/2018  8:00 PM  Margins Unattached edges (unapproximated) 10/16/2018 11:53 AM  Drainage Amount None 10/15/2018  8:00 PM  Drainage Description Sanguineous 10/16/2018 11:53 AM  Treatment Debridement (Selective);Hydrotherapy (Pulse lavage) 10/16/2018 11:53 AM      Hydrotherapy Pulsed lavage therapy - wound  location: sacrum Pulsed Lavage with Suction (psi): 12 psi Pulsed Lavage with Suction - Normal Saline Used: 1000 mL Pulsed Lavage Tip: Tip with splash shield Selective Debridement Selective Debridement - Location: sacrum Selective Debridement - Tools Used: Scalpel;Forceps Selective Debridement - Tissue Removed: removal of brown/black nonviable tissue, scored adherent eschar   Wound Assessment and Plan  Wound Therapy - Assess/Plan/Recommendations Wound Therapy - Clinical Statement: Pt with beginning of slow removal of eschar. Will benefit from continued hydrotherapy for removal of nonviable tissue. Wound Therapy - Functional Problem List: Decr mobililty, decr sitting balance Factors Delaying/Impairing Wound Healing: Diabetes Mellitus;Immobility;Multiple medical problems Hydrotherapy Plan: Debridement;Dressing change;Patient/family education;Pulsatile lavage with suction Wound Therapy - Frequency: 6X / week Wound Therapy - Follow Up Recommendations: Skilled nursing facility Wound Plan: See above  Wound Therapy Goals- Improve the function of patient's integumentary system by progressing the wound(s) through the phases of wound healing (inflammation - proliferation - remodeling) by: Decrease Necrotic Tissue to: 80 Decrease Necrotic Tissue - Progress: Progressing toward goal Increase Granulation Tissue to: 20 Increase Granulation Tissue - Progress: Progressing toward goal Goals/treatment plan/discharge plan were made with and agreed upon by patient/family: Yes Time For Goal Achievement: 7 days Wound Therapy - Potential for Goals: Fair  Goals will be updated until maximal potential achieved or discharge criteria met.  Discharge criteria: when goals achieved, discharge from hospital, MD decision/surgical intervention, no progress towards goals, refusal/missing three consecutive treatments without notification or medical reason.  GP     Ellamae Sia, PT, DPT Acute Rehabilitation  Services Pager [phone] Office [phone]   Willy Eddy 10/16/2018, 11:58 AM

## 2018-10-17 LAB — GLUCOSE, CAPILLARY
Glucose-Capillary: 112 mg/dL — ABNORMAL HIGH (ref 70–99)
Glucose-Capillary: 116 mg/dL — ABNORMAL HIGH (ref 70–99)
Glucose-Capillary: 179 mg/dL — ABNORMAL HIGH (ref 70–99)
Glucose-Capillary: 203 mg/dL — ABNORMAL HIGH (ref 70–99)
Glucose-Capillary: 210 mg/dL — ABNORMAL HIGH (ref 70–99)
Glucose-Capillary: 214 mg/dL — ABNORMAL HIGH (ref 70–99)

## 2018-10-17 LAB — RENAL FUNCTION PANEL
Albumin: 2.1 g/dL — ABNORMAL LOW (ref 3.5–5.0)
Anion gap: 12 (ref 5–15)
BUN: 14 mg/dL (ref 8–23)
CO2: 24 mmol/L (ref 22–32)
CREATININE: 1.35 mg/dL — AB (ref 0.44–1.00)
Calcium: 8.1 mg/dL — ABNORMAL LOW (ref 8.9–10.3)
Chloride: 100 mmol/L (ref 98–111)
GFR calc Af Amer: 45 mL/min — ABNORMAL LOW (ref >60)
GFR calc non Af Amer: 39 mL/min — ABNORMAL LOW (ref >60)
Glucose, Bld: 157 mg/dL — ABNORMAL HIGH (ref 70–99)
Phosphorus: 2.2 mg/dL — ABNORMAL LOW (ref 2.5–4.6)
Potassium: 2.8 mmol/L — ABNORMAL LOW (ref 3.5–5.1)
SODIUM: 136 mmol/L (ref 135–145)

## 2018-10-17 LAB — HEPARIN LEVEL (UNFRACTIONATED)
Heparin Unfractionated: 0.23 IU/mL — ABNORMAL LOW (ref 0.30–0.70)
Heparin Unfractionated: 0.28 IU/mL — ABNORMAL LOW (ref 0.30–0.70)

## 2018-10-17 LAB — CBC
HCT: 31.7 % — ABNORMAL LOW (ref 36.0–46.0)
Hemoglobin: 9.3 g/dL — ABNORMAL LOW (ref 12.0–15.0)
MCH: 27.2 pg (ref 26.0–34.0)
MCHC: 29.3 g/dL — ABNORMAL LOW (ref 30.0–36.0)
MCV: 92.7 fL (ref 80.0–100.0)
Platelets: 172 10*3/uL (ref 150–400)
RBC: 3.42 MIL/uL — ABNORMAL LOW (ref 3.87–5.11)
RDW: 22.2 % — AB (ref 11.5–15.5)
WBC: 14.1 10*3/uL — ABNORMAL HIGH (ref 4.0–10.5)
nRBC: 0 % (ref 0.0–0.2)

## 2018-10-17 MED ORDER — POTASSIUM CHLORIDE 20 MEQ PO PACK
40.0000 meq | PACK | Freq: Two times a day (BID) | ORAL | Status: AC
  Administered 2018-10-17 (×2): 40 meq via ORAL
  Filled 2018-10-17 (×2): qty 2

## 2018-10-17 MED ORDER — PREDNISONE 10 MG PO TABS
10.0000 mg | ORAL_TABLET | Freq: Every day | ORAL | Status: DC
  Administered 2018-10-18 – 2018-10-19 (×2): 10 mg
  Filled 2018-10-17 (×2): qty 1

## 2018-10-17 NOTE — Progress Notes (Signed)
[place] for Heparin (Apixaban on hold) Indication: atrial fibrillation / DVT  Allergies  Allergen Reactions  . Ace Inhibitors Cough  . Fentanyl     Fentanyl PATCH causes lethargy, somnolence, and AMS. Tolerated IV Fentanyl in small increments.    Patient Measurements: TBW 56 kg Heparin Dosing Weight: TBW  Vital Signs: Temp: 98.5 F (36.9 C) (01/19 0400) Temp Source: Oral (01/19 0400) BP: 101/69 (01/19 0400) Pulse Rate: 76 (01/19 1100)  Labs: Recent Labs    10/15/18 1124 10/16/18 0325 10/17/18 0228 10/17/18 1145  HGB 9.5* 9.5* 9.3*  --   HCT 31.9* 31.3* 31.7*  --   PLT 230 208 172  --   HEPARINUNFRC 0.31 0.37 0.23* 0.28*  CREATININE 2.11* 2.88* 1.35*  --     Estimated Creatinine Clearance: 27.1 mL/min (A) (by C-G formula based on SCr of 1.35 mg/dL (H)).  Assessment: 73 yo F presents with wound infection. On Eliquis PTA for DVT and Afib- last dose was 1/4 at 1000. Eliquis held due to suspicion for GIB.  Pharmacy asked to restart anticoagulation with IV heparin.  Heparin level slightly subtherapeutic after rate increase this morning, however lab drawn slightly early so suspect it will accumulate further. No issues with infusion per RN.  Goal of Therapy:  Heparin level 0.3-0.7 units/ml Monitor platelets by anticoagulation protocol: Yes   Plan:  -Increase heparin slightly to 1050 units/h -Daily heparin level and CBC -F/u ability to resume enteral anticoagulation eventually. (plans for HD access?)  Arrie Senate, PharmD, BCPS Clinical Pharmacist [phone] Please check AMION for all [place] numbers 10/17/2018

## 2018-10-17 NOTE — Care Management Note (Addendum)
Case Management Note  Patient Details  Name: [name] MRN: [id number] Date of Birth: 03-Jul-1946  Subjective/Objective:           Pt from [place].  Received consult to assess patient to [facility].   Pt has Cdiff, Cortrak for dysphagia (anticipated to d/c in next 24-48 hours), large sacral wound treated by PT, HD (pt is not able to sit for HD), on room air.       Action/Plan: Spoke with husband at bedside with Medicare list of LTACHs.  Discussed LTACHs and local choices. Husband states he prefers for patient to stay in this facility rather than transfer to [facility].  Referral called to [place], Physicist, medical.  She will assess patient further tomorrow and assess bed situation.     Expected Discharge Date:  09/27/18               Expected Discharge Plan:  Long Term Acute Care (LTAC)  In-House Referral:  Clinical Social Work  Discharge planning Services  CM Consult  Post Acute Care Choice:  NA Choice offered to:  NA  DME Arranged:  N/A DME Agency:  NA  HH Arranged:  NA HH Agency:  NA  Status of Service:  In process, will continue to follow  If discussed at Long Length of Stay Meetings, dates discussed:    Additional Comments:  Claudie Leach, RN 10/17/2018, 2:45 PM

## 2018-10-17 NOTE — Progress Notes (Signed)
Subjective:  HD yest- removed 2000 tolerated well  Objective Vital signs in last 24 hours: Vitals:   10/16/18 1950 10/16/18 2300 10/16/18 2318 10/17/18 0400  BP:  (!) 179/43 (!) 156/30 101/69  Pulse: 82  82   Resp: 19  11   Temp: (!) 97.5 F (36.4 C) 97.6 F (36.4 C) 98.6 F (37 C) 98.5 F (36.9 C)  TempSrc: Oral Oral Oral Oral  SpO2: 99%     Weight:      Height:       Weight change:   Intake/Output Summary (Last 24 hours) at 10/17/2018 0254 Last data filed at 10/17/2018 0500 Gross per 24 hour  Intake 3 ml  Output 3200 ml  Net -3197 ml   Dialysis Orders:  [facility] TTS  3.5h 61kg 400/800 Hep 1800 R TDC Mircera 225 q2weeks (last 09/14/18)   Assessment/ Plan: Pt is a 73 y.o. yo female ESRD who was admitted on 09/24/2018 with FTT after AKA culminating with PNA and respiratory failure req vent Assessment/Plan: 1. Pulm/ID - PNA and resp failure-  - extubated- sat 100%- WBC still up.  Now also C diff- on PO vanc  2. ESRD - normally TTS via [facility]- cont routine- next tx Tuesday.  Low K- will run on high K bath and replete  3. Anemia- req transfusion this hosp- on max darbe- no iron right now 4. Secondary hyperparathyroidism- phoslyra ordered, phos now within goal- even low, will hold phoslyra - no recent PTH data here 5. HTN/volume- BP stable- no BP meds- volume status slightly heavy- remove volume as able with HD 6. FTT- low albumin/decub that is large - has had palliative discussion but family requesting continued max efforts, cont with these discussions -difficult because is alert - FYI I do not think renal mass is of clinical significance- would not chase down.  Appreciate case management assist- yes she does need to be able to sit up for OP HD which she is not strong enough to do right now - possible LTACH candidate ?   Louis Meckel    Labs: Basic Metabolic Panel: Recent Labs  Lab 10/15/18 1124 10/16/18 0325 10/17/18 0228  NA 136 131* 136  K 2.9* 3.3* 2.8*  CL  98 97* 100  CO2 25 22 24   GLUCOSE 190* 243* 157*  BUN 25* 38* 14  CREATININE 2.11* 2.88* 1.35*  CALCIUM 8.1* 8.2* 8.1*  PHOS 4.0 4.5 2.2*   Liver Function Tests: Recent Labs  Lab 10/12/18 0318  10/15/18 1124 10/16/18 0325 10/17/18 0228  AST 30  --   --   --   --   ALT 22  --   --   --   --   ALKPHOS 80  --   --   --   --   BILITOT 0.7  --   --   --   --   PROT 5.6*  --   --   --   --   ALBUMIN 1.5*   < > 1.6* 1.6* 2.1*   < > = values in this interval not displayed.   No results for input(s): LIPASE, AMYLASE in the last 168 hours. No results for input(s): AMMONIA in the last 168 hours. CBC: Recent Labs  Lab 10/13/18 0231 10/14/18 0308 10/15/18 1124 10/16/18 0325 10/17/18 0228  WBC 16.4* 20.2* 17.7* 16.7* 14.1*  HGB 9.2* 8.8* 9.5* 9.5* 9.3*  HCT 30.5* 30.0* 31.9* 31.3* 31.7*  MCV 92.1 92.0 94.7 93.7 92.7  PLT 252  291 230 208 172   Cardiac Enzymes: No results for input(s): CKTOTAL, CKMB, CKMBINDEX, TROPONINI in the last 168 hours. CBG: Recent Labs  Lab 10/16/18 1843 10/16/18 1948 10/17/18 0004 10/17/18 0422 10/17/18 0731  GLUCAP 122* 155* 214* 116* 179*    Iron Studies:  No results for input(s): IRON, TIBC, TRANSFERRIN, FERRITIN in the last 72 hours. Studies/Results: No results found. Medications: Infusions: . sodium chloride Stopped (10/11/18 0902)  . feeding supplement (VITAL AF 1.2 CAL) 1,000 mL (10/15/18 0547)  . heparin 1,000 Units/hr (10/17/18 0425)    Scheduled Medications: . acetaminophen (TYLENOL) oral liquid 160 mg/5 mL  650 mg Per Tube Q6H  . amiodarone  200 mg Per Tube Daily  . brimonidine  1 drop Left Eye BID   And  . timolol  1 drop Left Eye BID  . calcium acetate (Phos Binder)  667 mg Per Tube TID  . Chlorhexidine Gluconate Cloth  6 each Topical Q0600  . Chlorhexidine Gluconate Cloth  6 each Topical Q0600  . darbepoetin (ARANESP) injection - DIALYSIS  200 mcg Intravenous Q Sat-HD  . feeding supplement (PRO-STAT SUGAR FREE 64)  60 mL  Per Tube BID  . heparin  3,200 Units Intravenous Once  . insulin aspart  0-9 Units Subcutaneous Q4H  . latanoprost  1 drop Both Eyes QHS  . pantoprazole (PROTONIX) IV  40 mg Intravenous QHS  . predniSONE  20 mg Per Tube Q breakfast  . sodium chloride flush  3 mL Intravenous Q12H  . sodium hypochlorite   Irrigation q morning - 10a  . vancomycin  125 mg Per Tube QID    have reviewed scheduled and prn medications.  Physical Exam: General: soft spoken- - frail  Heart: RRR Lungs: mostly clear Abdomen: soft, non tender Extremities: min edema- tender to touch- bilat AKA Dialysis Access: right sided TDC     10/17/2018,8:32 AM  LOS: 23 days

## 2018-10-17 NOTE — Progress Notes (Addendum)
[place] TEAM 1 - Stepdown/ICU TEAM  [name]  KPT:[id number] DOB: May 03, 1946 DOA: 09/24/2018 PCP: Sharilyn Sites, MD    Brief Narrative:  73 year old woman with ESRD, peripheral vascular disease, diabetes, hypertension with diastolic dysfunction, left AKA 09/26/2018 for LLE ischemia and metatarsal wound. Poor p.o. intake, progressive weakness, A. fib since the procedure. She was later noted to have lethargy and difficulty with airway protection with pooling secretions and increased work of breathing. PCCM consulted for evolving resp failure. She was intubated on 10/01/2018 and extubated on 10/11/2018 and transferred to the Hospitalist service.  Significant Events: 12/27 admission with left lower extremity gangrenous wound 12/29 left AKA 1/3 transfer to ICU and intubated 1/4 OGT with bright red blood 1/13 ETT removed  1/15 Plastic surgery consulted for large sacral decubitus wound  Subjective: The patient is resting comfortably in bed today.  She reports that she feels weak in general but she denies chest pain shortness of breath nausea vomiting or abdominal pain.  I spoke with her husband at the bedside.  She is anxious to be allowed to eat.  Assessment & Plan:  Septic shock and Acute hypoxic resp failure due to ESBL E. coli pneumonia  Completed 8 day course merem - repeat blood cultures 1/9 negative - clinically stable on RA presently   C diff colitis  oral vancomycin 125mg  QID - flexiseal in place   Acute on Chronic Anemia - UGIB in setting of anticoagulation likely secondary to NG tube trauma - chronic anemia likely due to ESRD and chronic disease - s/p 1U PRBC this admit - Hgb stable   Recent Labs  Lab 10/13/18 0231 10/14/18 0308 10/15/18 1124 10/16/18 0325 10/17/18 0228  HGB 9.2* 8.8* 9.5* 9.5* 9.3*    Chronic right-sided abdominal pain Stable at present  End-stage renal disease - HD TTS Care per Nephrology - is not presently able to sit up as would be  required for outpatient hemodialysis  Recent L AKA for ischemia/gangrene - prior R AKA - PVD continue wound care  Large sacral decubitus ulcer WOC team following - Plastic Surgery consulted   Dysphagia Felt to be due to generalized weakness + intubation - SLP following - hopeful diet can be resumed within next 24-48hrs - cont feeding per Cortrak for now   Hypertension Blood pressure well controlled post HD yesterday   Chronic diastolic CHF Volume management per hemodialysis Filed Weights   10/14/18 1615 10/15/18 0500 10/16/18 0350  Weight: 54 kg 48.5 kg 48.5 kg    Paroxysmal atrial fibrillation Continue Amiodarone - heparin drip - plan to resume apixiban in AM if Hgb remains stable   DM2 CBG reasonably controlled today - follow without change  SLE Holding Plaquenil given concern for sepsis - transitioned to prednisone with plan to wean to home dose over 3-4 days   3 cm right upper pole renal mass of unclear significance per Nephrology do not think renal mass is of clinical significance  COPD  Well compensated  DVT prophylaxis: heparin per pharmacy  Code Status: FULL CODE Family Communication: Spoke with husband at bedside Disposition Plan: Possible LTACH placement  Consultants:  Vascular Surgery Nephrology Plastic Surgery  Antimicrobials:  Oral vanc 1/9 >  Objective: Blood pressure 101/69, pulse 76, temperature 98.5 F (36.9 C), temperature source Oral, resp. rate 15, height 5' (1.524 m), weight 48.5 kg, SpO2 99 %.  Intake/Output Summary (Last 24 hours) at 10/17/2018 1421 Last data filed at 10/17/2018 0944 Gross per 24 hour  Intake 3  ml  Output 3200 ml  Net -3197 ml   Filed Weights   10/14/18 1615 10/15/18 0500 10/16/18 0350  Weight: 54 kg 48.5 kg 48.5 kg    Examination: General: No acute respiratory distress at rest in bed Lungs: CTA B -no wheezing Cardiovascular: Regular rate without murmur or rub Abdomen: Nontender, nondistended, soft, bowel  sounds positive, no rebound Extremities: Stable  CBC: Recent Labs  Lab 10/15/18 1124 10/16/18 0325 10/17/18 0228  WBC 17.7* 16.7* 14.1*  HGB 9.5* 9.5* 9.3*  HCT 31.9* 31.3* 31.7*  MCV 94.7 93.7 92.7  PLT 230 208 580   Basic Metabolic Panel: Recent Labs  Lab 10/12/18 0318 10/13/18 0231  10/15/18 1124 10/16/18 0325 10/17/18 0228  NA 136 133*   < > 136 131* 136  K 3.2* 4.3   < > 2.9* 3.3* 2.8*  CL 101 96*   < > 98 97* 100  CO2 19* 23   < > 25 22 24   GLUCOSE 88 191*   < > 190* 243* 157*  BUN 85* 23   < > 25* 38* 14  CREATININE 4.73* 2.26*   < > 2.11* 2.88* 1.35*  CALCIUM 8.3* 8.2*   < > 8.1* 8.2* 8.1*  MG 2.0 1.9  --   --   --   --   PHOS 7.2* 5.2*   < > 4.0 4.5 2.2*   < > = values in this interval not displayed.   GFR: Estimated Creatinine Clearance: 27.1 mL/min (A) (by C-G formula based on SCr of 1.35 mg/dL (H)).  Liver Function Tests: Recent Labs  Lab 10/12/18 0318  10/14/18 0308 10/15/18 1124 10/16/18 0325 10/17/18 0228  AST 30  --   --   --   --   --   ALT 22  --   --   --   --   --   ALKPHOS 80  --   --   --   --   --   BILITOT 0.7  --   --   --   --   --   PROT 5.6*  --   --   --   --   --   ALBUMIN 1.5*   < > 1.5* 1.6* 1.6* 2.1*   < > = values in this interval not displayed.    HbA1C: Hgb A1c MFr Bld  Date/Time Value Ref Range Status  09/06/2018 07:32 AM 4.5 (L) 4.8 - 5.6 % Final    Comment:    (NOTE) Pre diabetes:          5.7%-6.4% Diabetes:              >6.4% Glycemic control for   <7.0% adults with diabetes   08/09/2018 11:59 AM 5.0 4.8 - 5.6 % Final    Comment:             Prediabetes: 5.7 - 6.4          Diabetes: >6.4          Glycemic control for adults with diabetes: <7.0     CBG: Recent Labs  Lab 10/16/18 1948 10/17/18 0004 10/17/18 0422 10/17/18 0731 10/17/18 1159  GLUCAP 155* 214* 116* 179* 210*     Scheduled Meds: . acetaminophen (TYLENOL) oral liquid 160 mg/5 mL  650 mg Per Tube Q6H  . amiodarone  200 mg Per Tube  Daily  . brimonidine  1 drop Left Eye BID   And  . timolol  1 drop  Left Eye BID  . Chlorhexidine Gluconate Cloth  6 each Topical Q0600  . Chlorhexidine Gluconate Cloth  6 each Topical Q0600  . darbepoetin (ARANESP) injection - DIALYSIS  200 mcg Intravenous Q Sat-HD  . feeding supplement (PRO-STAT SUGAR FREE 64)  60 mL Per Tube BID  . heparin  3,200 Units Intravenous Once  . insulin aspart  0-9 Units Subcutaneous Q4H  . latanoprost  1 drop Both Eyes QHS  . pantoprazole (PROTONIX) IV  40 mg Intravenous QHS  . potassium chloride  40 mEq Oral BID  . predniSONE  20 mg Per Tube Q breakfast  . sodium chloride flush  3 mL Intravenous Q12H  . sodium hypochlorite   Irrigation q morning - 10a  . vancomycin  125 mg Per Tube QID   Continuous Infusions: . sodium chloride Stopped (10/11/18 0902)  . feeding supplement (VITAL AF 1.2 CAL) 1,000 mL (10/15/18 0547)  . heparin 1,050 Units/hr (10/17/18 1329)     LOS: 23 days   Cherene Altes, MD [facility] Office  [phone] Pager - Text Page per Amion  If 7PM-7AM, please contact night-coverage per Amion 10/17/2018, 2:21 PM

## 2018-10-17 NOTE — Progress Notes (Signed)
[place] for Heparin (Apixaban on hold) Indication: atrial fibrillation / DVT  Allergies  Allergen Reactions  . Ace Inhibitors Cough  . Fentanyl     Fentanyl PATCH causes lethargy, somnolence, and AMS. Tolerated IV Fentanyl in small increments.    Patient Measurements: TBW 56 kg Heparin Dosing Weight: TBW  Vital Signs: Temp: 98.6 F (37 C) (01/18 2318) Temp Source: Oral (01/18 2318) BP: 156/30 (01/18 2318) Pulse Rate: 82 (01/18 2318)  Labs: Recent Labs    10/15/18 1124 10/16/18 0325 10/17/18 0228  HGB 9.5* 9.5* 9.3*  HCT 31.9* 31.3* 31.7*  PLT 230 208 172  HEPARINUNFRC 0.31 0.37 0.23*  CREATININE 2.11* 2.88* 1.35*    Estimated Creatinine Clearance: 27.1 mL/min (A) (by C-G formula based on SCr of 1.35 mg/dL (H)).  Assessment: 73 yo F presents with wound infection. On Eliquis PTA for DVT and Afib- last dose was 1/4 at 1000. Eliquis held due to suspicion for GIB.  Pharmacy asked to restart anticoagulation with IV heparin.  1/19 AM update: heparin level low this AM, no issues per RN, Hgb low/stable  Goal of Therapy:  Heparin level 0.3-0.7 units/ml Monitor platelets by anticoagulation protocol: Yes   Plan:  -Inc heparin to 1000 units/hr -[place], PharmD, BCPS Clinical Pharmacist Phone: [phone]

## 2018-10-18 LAB — CBC
HCT: 33.6 % — ABNORMAL LOW (ref 36.0–46.0)
Hemoglobin: 9.7 g/dL — ABNORMAL LOW (ref 12.0–15.0)
MCH: 27.5 pg (ref 26.0–34.0)
MCHC: 28.9 g/dL — ABNORMAL LOW (ref 30.0–36.0)
MCV: 95.2 fL (ref 80.0–100.0)
NRBC: 0 % (ref 0.0–0.2)
PLATELETS: 193 10*3/uL (ref 150–400)
RBC: 3.53 MIL/uL — ABNORMAL LOW (ref 3.87–5.11)
RDW: 22.9 % — ABNORMAL HIGH (ref 11.5–15.5)
WBC: 14.1 10*3/uL — AB (ref 4.0–10.5)

## 2018-10-18 LAB — RENAL FUNCTION PANEL
Albumin: 1.9 g/dL — ABNORMAL LOW (ref 3.5–5.0)
Anion gap: 13 (ref 5–15)
BUN: 37 mg/dL — ABNORMAL HIGH (ref 8–23)
CO2: 22 mmol/L (ref 22–32)
Calcium: 8.5 mg/dL — ABNORMAL LOW (ref 8.9–10.3)
Chloride: 102 mmol/L (ref 98–111)
Creatinine, Ser: 2.52 mg/dL — ABNORMAL HIGH (ref 0.44–1.00)
GFR calc Af Amer: 21 mL/min — ABNORMAL LOW (ref >60)
GFR, EST NON AFRICAN AMERICAN: 18 mL/min — AB (ref >60)
Glucose, Bld: 108 mg/dL — ABNORMAL HIGH (ref 70–99)
POTASSIUM: 3.9 mmol/L (ref 3.5–5.1)
Phosphorus: 3.7 mg/dL (ref 2.5–4.6)
Sodium: 137 mmol/L (ref 135–145)

## 2018-10-18 LAB — GLUCOSE, CAPILLARY
GLUCOSE-CAPILLARY: 108 mg/dL — AB (ref 70–99)
Glucose-Capillary: 143 mg/dL — ABNORMAL HIGH (ref 70–99)
Glucose-Capillary: 148 mg/dL — ABNORMAL HIGH (ref 70–99)
Glucose-Capillary: 193 mg/dL — ABNORMAL HIGH (ref 70–99)
Glucose-Capillary: 204 mg/dL — ABNORMAL HIGH (ref 70–99)
Glucose-Capillary: 211 mg/dL — ABNORMAL HIGH (ref 70–99)

## 2018-10-18 LAB — HEPARIN LEVEL (UNFRACTIONATED): Heparin Unfractionated: 0.36 IU/mL (ref 0.30–0.70)

## 2018-10-18 MED ORDER — CHLORHEXIDINE GLUCONATE CLOTH 2 % EX PADS
6.0000 | MEDICATED_PAD | Freq: Every day | CUTANEOUS | Status: DC
  Administered 2018-10-18 – 2018-10-23 (×5): 6 via TOPICAL

## 2018-10-18 NOTE — Progress Notes (Signed)
Chaplain rec'd referral from [place] office.  Patient desired notary for AD.  Spoke with husband who explained that he needed AD completed for insurance purposes, and he wants to be present for AD signing but he has a drive to hospital a distance and doesn't know when he will be here for her dialysis.  Chaplain gave pt's husband's telephone number to spiritual care administrator to make call in morning for notary appointment. Tamsen Snider Pager [phone]

## 2018-10-18 NOTE — Plan of Care (Signed)

## 2018-10-18 NOTE — Progress Notes (Signed)
Notified MD patient has had a productive cough with pink tinged sputum. Will continue to monitor.

## 2018-10-18 NOTE — Progress Notes (Signed)
Follow up done with Corrina from [facility] regarding LTACH referral- per [facility] they will not be able to offer bed. Reached out to [place] with [facility] who states that [facility] could offer bed to pt pending insurance approval. CM has spoken to pt and husband at bedside to update regarding LTACH options, and offer [facility] as an option. Per Husband he would like to move forward with [facility] at this time to see if insurance will approve, call made to Raquel Sarna to give permission per family to submit insurance auth. Update provided to MD, will await decision of insurance for [facility] approval.

## 2018-10-18 NOTE — Progress Notes (Signed)
Patient had a visitor that gave the patient some food by mouth. The patient stated her visitor gave her "a cookie and 2 chips" I reminded the patient that she cannot have anything by mouth. Will continue to monitor.

## 2018-10-18 NOTE — Progress Notes (Signed)
Nutrition Follow Up  DOCUMENTATION CODES:   Not applicable  INTERVENTION:    Continue Vital AF 1.2 at goal rate of 60 ml/hr  Provides 1728 kcals, 108 gm protein, 1167 ml free water daily   NUTRITION DIAGNOSIS:   Inadequate oral intake now related to dysphagia as evidenced by NPO status, ongoing  GOAL:   Patient will meet greater than or equal to 90% of their needs, met  MONITOR:   TF tolerance, Labs, Skin, Weight trends, I & O's  ASSESSMENT:    73 y.o. Female with PMH significant of HTN, DM, COPD, anemia, atrial fib and DVT on Eliquis, CHF, ESRD-HD (TTS), lupus, PAD, s/p R AKA. She presented on 12/27 with L foot pain. Patient reported having L transmetatarsal amputation on 09/06/18. There was concern for gangrene in the foot. She underwent L AKA on 12/29. Subsequent course complicated by poor p.o. intake in the setting malnutrition, oral thrush and right flank pain.   1/03 transfer to ICU and intubated 1/04 OGT with bright red blood 1/13 extubated, TF via OGT  1/14 MBSS, SLP rec NPO, Cortrak placed  Pt transferred out of 2H-ICU to [id number] 1/16. Vital AF 1.2 infusing at goal rate of 60 ml/hr via Cortrak tube. S/p dysphagia treatment per Speech Path today; rec repeat MBSS.  Pt receiving hydrotherapy for large sacral wound per PT. Labs & medications reviewed. BUN 37 (H).  CBG's [id number].  ESRD on HD; Nephrology following; for HD tomorrow. Will keep pt on Vital AF 1.2 formula at this time given C-diff/ongoing diarrhea. Noted possible discharge to [facility].  Diet Order:   Diet Order            Diet NPO time specified  Diet effective now             EDUCATION NEEDS:   Not appropriate for education at this time  Skin:  Skin Assessment: Skin Integrity Issues: DTI: left thigh (1/6), left buttocks (1/6) Stage II: ischial tuberosity (1/6) Stage III: sacrum (12/30) Incisions: L leg for AKA (12/29)  Last BM:  1/20 flexiseal   Intake/Output Summary  (Last 24 hours) at 10/18/2018 1620 Last data filed at 10/17/2018 1630 Gross per 24 hour  Intake 0 ml  Output -  Net 0 ml   Height:   Ht Readings from Last 1 Encounters:  09/25/18 5' (1.524 m)   Weight:   Wt Readings from Last 1 Encounters:  10/18/18 49.4 kg   Ideal Body Weight:  45.45 kg  BMI:  26.8 kg/m2 >> adjusted for AKA  Estimated Nutritional Needs:   Kcal:  1700-1900  Protein:  95-110 gm  Fluid:  per MD  Arthur Holms, RD, LDN Pager #: [phone] After-Hours Pager #: [phone]

## 2018-10-18 NOTE — Progress Notes (Signed)
Physical Therapy Wound Treatment Patient Details  Name: [name] MRN: [id number] Date of Birth: 11/22/1945  Today's Date: 10/18/2018 Time: 1009-1053 Time Calculation (min): 44 min  Subjective  Subjective: "Are you going to hurt me?" Patient and Family Stated Goals: To not hurt Date of Onset: (unknown) Prior Treatments: dressing change  Pain Score: Pain Score: 0-No pain  Wound Assessment  Pressure Injury 09/27/18 Unstageable - Full thickness tissue loss in which the base of the ulcer is covered by slough (yellow, tan, gray, green or brown) and/or eschar (tan, brown or black) in the wound bed. (Active)  Dressing Type ABD;Barrier Film (skin prep);Tape dressing;Other (Comment) 10/18/2018 11:38 AM  Dressing Intact 10/18/2018 11:38 AM  Dressing Change Frequency Daily 10/18/2018 11:38 AM  State of Healing Eschar 10/18/2018 11:38 AM  Site / Wound Assessment Black;Red;Yellow 10/18/2018 11:38 AM  % Wound base Red or Granulating 5% 10/18/2018 11:38 AM  % Wound base Yellow/Fibrinous Exudate 0% 10/18/2018 11:38 AM  % Wound base Black/Eschar 95% 10/18/2018 11:38 AM  % Wound base Other/Granulation Tissue (Comment) 0% 10/18/2018 11:38 AM  Peri-wound Assessment Erythema (blanchable) 10/18/2018 11:38 AM  Wound Length (cm) 9.5 cm 10/14/2018 11:00 AM  Wound Width (cm) 9.3 cm 10/14/2018 11:00 AM  Wound Depth (cm) 0.1 cm 10/14/2018 11:00 AM  Wound Surface Area (cm^2) 88.35 cm^2 10/14/2018 11:00 AM  Wound Volume (cm^3) 8.84 cm^3 10/14/2018 11:00 AM  Tunneling (cm) 0 10/06/2018  8:00 PM  Undermining (cm) 0 10/06/2018  8:00 PM  Margins Unattached edges (unapproximated) 10/16/2018 11:53 AM  Drainage Amount Minimal 10/18/2018 11:38 AM  Drainage Description Green 10/18/2018 11:38 AM  Treatment Debridement (Selective);Hydrotherapy (Pulse lavage);Tape changed;Other (Comment) 10/18/2018 11:38 AM         Hydrotherapy Pulsed lavage therapy - wound location: (sacrum) Pulsed Lavage with Suction (psi): 12 psi Pulsed  Lavage with Suction - Normal Saline Used: 1000 mL Pulsed Lavage Tip: Tip with splash shield Selective Debridement Selective Debridement - Location: (sacrum) Selective Debridement - Tools Used: Forceps;Scalpel Selective Debridement - Tissue Removed: removal of eschar to deroof and expose wound bed.     Wound Assessment and Plan  Clinical statement:  Pt presents with softened eschar and continues to present with scant blue/green drainage.  Able to deroof wound bed and remove around 80% of eschar to reveal wound bed.  Pt continues to require future debridement to remove non-viable tissue and decrease bioburden.  Plan next session to assess need for continued dakins solution Wound Therapy - Assess/Plan/Recommendations Wound Therapy - Functional Problem List: decreased mobility, decreased sitting balance Factors Delaying/Impairing Wound Healing: Diabetes Mellitus;Immobility;Multiple medical problems Hydrotherapy Plan: Debridement;Dressing change;Patient/family education;Pulsatile lavage with suction Wound Therapy - Frequency: 6X / week Wound Therapy - Follow Up Recommendations: Skilled nursing facility Wound Plan: see above  Wound Therapy Goals- Improve the function of patient's integumentary system by progressing the wound(s) through the phases of wound healing (inflammation - proliferation - remodeling) by: Decrease Necrotic Tissue to: (80) Decrease Necrotic Tissue - Progress: Progressing toward goal Increase Granulation Tissue to: 20 Increase Granulation Tissue - Progress: Progressing toward goal Goals/treatment plan/discharge plan were made with and agreed upon by patient/family: Yes Time For Goal Achievement: 7 days Wound Therapy - Potential for Goals: Fair  Goals will be updated until maximal potential achieved or discharge criteria met.  Discharge criteria: when goals achieved, discharge from hospital, MD decision/surgical intervention, no progress towards goals, refusal/missing three  consecutive treatments without notification or medical reason.  GP      [name]  10/18/2018, 11:58 AM Governor Rooks, PTA Acute Rehabilitation Services Pager [phone] Office [phone]

## 2018-10-18 NOTE — Progress Notes (Signed)
  Speech Language Pathology Treatment: Dysphagia  Patient Details Name: [name] MRN: [id number] DOB: 08-27-1946 Today's Date: 10/18/2018 Time: 1110-1140 SLP Time Calculation (min) (ACUTE ONLY): 30 min  Assessment / Plan / Recommendation Clinical Impression  Patient seen today to determine readiness for PO diet and/or instrumental evaluation. Patient continues to tolerate ice chip trials with nursing and SLP.  She had delayed clearing or coughing with tsp and small cup sips intermittently. With the first few bites of puree, patient had no clearing, but as she continued intake of puree, she began to have a delayed throat clear increasing with frequency as trials continued. Suspect pharyngeal residue, may be complicated by NG tube. Recommend MBS to evaluate dysphagia, make appropriate diet recommendations and to set therapeutic goals.    HPI HPI: Pt is a 73 y.o. female admitted with progressive gangrene of her left foot and is status post AKA on 09/26/2018. She has demonstrated poor p.o. intake, oral thush, as well as " significant difficulty with secretion clearance, evolving poor cough and increased work of breathing in addition to malnutrition". She developed encephalopathy, was unable to clear airway secretions and required intubation on 1/3 with extubation on 1/13. Nursing reported that NG tube placement was attempted four times without success.  Pt now with Cortrak. Voice largely aphonic.      SLP Plan  Continue with current plan of care       Recommendations  Diet recommendations: NPO;Other(comment)(ice chips with nurse only) Medication Administration: Via alternative means                Oral Care Recommendations: Oral care prior to ice chip/H20 Follow up Recommendations: Skilled Nursing facility SLP Visit Diagnosis: Dysphagia, pharyngeal phase (R13.13) Plan: Continue with current plan of care       [place], MA, CCC-SLP 10/18/2018  12:30 PM

## 2018-10-18 NOTE — Progress Notes (Addendum)
PROGRESS NOTE    [name]  JEH:[id number] DOB: [birth year] DOA: 09/24/2018 PCP: Sharilyn Sites, MD    Brief Narrative:  73 year old female who presents with left foot pain and altered mental status.  She does have significant past medical history for hypertension, dyslipidemia, type 2 diabetes mellitus, COPD, GERD, gout, anemia, atrial fibrillation, DVT, end-stage renal disease on hemodialysis, (Tuesday Thursday Saturday), peripheral artery disease status post right AKA, lupus and heart failure.   Patient had a transmetatarsal amputation September 06, 2018, at home she developed worsening left foot pain associated with drainage and fevers.  She was seen at the vascular surgery office, she was diagnosed with gangrene of the left foot and she was sent to the hospital for further evaluation.  December 29 she underwent left above-the-knee amputation, then she developed respiratory failure, worsening encephalopathy and required invasive mechanical ventilation.  She remained in the ventilator for about 10 days. (10/01/2018 to 10/11/2018)   Her hospital stay has been complicated by septic shock and acute hypoxic respiratory failure due to ESBL E. coli pneumonia, C. difficile colitis, acute on chronic anemia, land a large decubitus ulcer.  Assessment & Plan:   Principal Problem:   Left foot infection Active Problems:   SLE (systemic lupus erythematosus) (HCC)   Essential hypertension, benign   COPD (chronic obstructive pulmonary disease) (HCC)   Chronic diastolic heart failure (HCC)   Goals of care, counseling/discussion   Mixed hyperlipidemia   Atrial fibrillation (HCC)   Type II diabetes mellitus with peripheral autonomic neuropathy (HCC)   ESRD on dialysis (HCC)   S/P AKA (above knee amputation) unilateral, right (HCC)   Status post amputation of left foot through metatarsal bone (HCC)   Pressure ulcer, stage 3 (HCC)   GERD (gastroesophageal reflux disease)   Gout   S/P AKA  (above knee amputation) bilateral (HCC)   Acute on chronic anemia   Supplemental oxygen dependent   Hypertension   Acute on chronic diastolic CHF (congestive heart failure) (HCC)   FTT (failure to thrive) in adult   Oral candidiasis   Dysphagia   S/P AKA (above knee amputation) unilateral, left ([place])   Palliative care by specialist   1. Septic shock due to E coli (ESBL) pneumonia (not present on admission). Patient had intermittent cough but no dyspnea, she has completed her antibiotic therapy with meropenem for last 8 days.   2. Left ischemic limb, sp aka. Continue post op care. No significant pain.   3. C diff. Patient continue to have diarrhea, rectal tube in place.   4. ESRD on HD. On TTS, schedule, clinically euvolemic, will continue to follow nephrology recommendations, patient has a catheter on the right IJ.   5. HTN. Continue blood pressure control with   6. Large sacral decubitus ulcer, not present on admission. Continue local wound care.    7. Swallow dysfunction. Continue NG tube for feedings, follow on swallow evaluation, if persistent aspiration risk, will need to consider peg tube.   8. T2DM. Continue insulin sliding scale for glucose cover and monitoring.   9. Atrial fibrillation. Continue amiodarone for rate control. Not on full anticoagulation.   DVT prophylaxis: heparin   Code Status: full Family Communication: I spoke with patient's family at the bedside and all questions were addressed.  Disposition Plan/ discharge barriers: pending placement LTAC  Body mass index is 21.29 kg/m. Malnutrition Type:  Nutrition Problem: Inadequate oral intake Etiology: dysphagia   Malnutrition Characteristics:  Signs/Symptoms: NPO status   Nutrition Interventions:  Interventions: Tube feeding  RN Pressure Injury Documentation: Pressure Injury 09/27/18 Unstageable - Full thickness tissue loss in which the base of the ulcer is covered by slough (yellow, tan, gray,  green or brown) and/or eschar (tan, brown or black) in the wound bed. (Active)  09/27/18 0400  Location: Sacrum  Location Orientation:   Staging: Unstageable - Full thickness tissue loss in which the base of the ulcer is covered by slough (yellow, tan, gray, green or brown) and/or eschar (tan, brown or black) in the wound bed.  Wound Description (Comments):   Present on Admission: Yes     Pressure Injury 10/04/18 Stage II -  Partial thickness loss of dermis presenting as a shallow open ulcer with a red, pink wound bed without slough. (Active)  10/04/18 0800  Location: Ischial tuberosity  Location Orientation: Left  Staging: Stage II -  Partial thickness loss of dermis presenting as a shallow open ulcer with a red, pink wound bed without slough.  Wound Description (Comments):   Present on Admission: No     Pressure Injury 10/04/18 Deep Tissue Injury - Purple or maroon localized area of discolored intact skin or blood-filled blister due to damage of underlying soft tissue from pressure and/or shear. (Active)  10/04/18 0800  Location: Thigh  Location Orientation: Left;Anterior  Staging: Deep Tissue Injury - Purple or maroon localized area of discolored intact skin or blood-filled blister due to damage of underlying soft tissue from pressure and/or shear.  Wound Description (Comments):   Present on Admission:      Pressure Injury 10/04/18 Deep Tissue Injury - Purple or maroon localized area of discolored intact skin or blood-filled blister due to damage of underlying soft tissue from pressure and/or shear. (Active)  10/04/18 0800  Location: Buttocks  Location Orientation: Left;Upper  Staging: Deep Tissue Injury - Purple or maroon localized area of discolored intact skin or blood-filled blister due to damage of underlying soft tissue from pressure and/or shear.  Wound Description (Comments):   Present on Admission:      Pressure Injury 10/14/18 Stage II -  Partial thickness loss of dermis  presenting as a shallow open ulcer with a red, pink wound bed without slough. R gluteal fold (Active)  10/14/18 1120  Location: Buttocks  Location Orientation: Right  Staging: Stage II -  Partial thickness loss of dermis presenting as a shallow open ulcer with a red, pink wound bed without slough.  Wound Description (Comments): R gluteal fold  Present on Admission:      Consultants:     Procedures:     Antimicrobials:       Subjective: Patient complains of discomfort at the ng tube site, no nausea or vomiting, no chest pain or dyspnea, continue to have diarrhea.   Objective: Vitals:   10/17/18 1300 10/17/18 1500 10/17/18 1700 10/18/18 0434  BP:    (!) 120/56  Pulse: 81 80 83 78  Resp: 17 16 20 19   Temp:    98.2 F (36.8 C)  TempSrc:    Oral  SpO2: 100% 100% 100% 99%  Weight:    49.4 kg  Height:        Intake/Output Summary (Last 24 hours) at 10/18/2018 1259 Last data filed at 10/17/2018 1630 Gross per 24 hour  Intake 0 ml  Output -  Net 0 ml   Filed Weights   10/15/18 0500 10/16/18 0350 10/18/18 0434  Weight: 48.5 kg 48.5 kg 49.4 kg    Examination:   General: Deconditioned and  ill looking appearing.  Neurology: Awake and alert, non focal  E ENT: no pallor, no icterus, oral mucosa moist/ ng tube in place.  Cardiovascular: No JVD. S1-S2 present, rhythmic, no gallops, rubs, or murmurs. Bilateral AKA. Pulmonary: positive breath sounds bilaterally, adequate air movement, no wheezing, rhonchi or rales. Gastrointestinal. Abdomen protuberant no organomegaly, non tender, no rebound or guarding Skin. She does have a sacral pressure ulcer not examined today.  Musculoskeletal: bilateral aka.      Data Reviewed: I have personally reviewed following labs and imaging studies  CBC: Recent Labs  Lab 10/14/18 0308 10/15/18 1124 10/16/18 0325 10/17/18 0228 10/18/18 0244  WBC 20.2* 17.7* 16.7* 14.1* 14.1*  HGB 8.8* 9.5* 9.5* 9.3* 9.7*  HCT 30.0* 31.9* 31.3*  31.7* 33.6*  MCV 92.0 94.7 93.7 92.7 95.2  PLT 291 230 208 172 149   Basic Metabolic Panel: Recent Labs  Lab 10/12/18 0318 10/13/18 0231 10/14/18 0308 10/15/18 1124 10/16/18 0325 10/17/18 0228 10/18/18 0244  NA 136 133* 129* 136 131* 136 137  K 3.2* 4.3 4.1 2.9* 3.3* 2.8* 3.9  CL 101 96* 95* 98 97* 100 102  CO2 19* 23 19* 25 22 24 22   GLUCOSE 88 191* 261* 190* 243* 157* 108*  BUN 85* 23 41* 25* 38* 14 37*  CREATININE 4.73* 2.26* 3.27* 2.11* 2.88* 1.35* 2.52*  CALCIUM 8.3* 8.2* 8.3* 8.1* 8.2* 8.1* 8.5*  MG 2.0 1.9  --   --   --   --   --   PHOS 7.2* 5.2* 7.6* 4.0 4.5 2.2* 3.7   GFR: Estimated Creatinine Clearance: 14.5 mL/min (A) (by C-G formula based on SCr of 2.52 mg/dL (H)). Liver Function Tests: Recent Labs  Lab 10/12/18 0318  10/14/18 0308 10/15/18 1124 10/16/18 0325 10/17/18 0228 10/18/18 0244  AST 30  --   --   --   --   --   --   ALT 22  --   --   --   --   --   --   ALKPHOS 80  --   --   --   --   --   --   BILITOT 0.7  --   --   --   --   --   --   PROT 5.6*  --   --   --   --   --   --   ALBUMIN 1.5*   < > 1.5* 1.6* 1.6* 2.1* 1.9*   < > = values in this interval not displayed.   No results for input(s): LIPASE, AMYLASE in the last 168 hours. No results for input(s): AMMONIA in the last 168 hours. Coagulation Profile: No results for input(s): INR, PROTIME in the last 168 hours. Cardiac Enzymes: No results for input(s): CKTOTAL, CKMB, CKMBINDEX, TROPONINI in the last 168 hours. BNP (last 3 results) No results for input(s): PROBNP in the last 8760 hours. HbA1C: No results for input(s): HGBA1C in the last 72 hours. CBG: Recent Labs  Lab 10/17/18 2027 10/18/18 0014 10/18/18 0431 10/18/18 0752 10/18/18 1143  GLUCAP 203* 143* 108* 204* 148*   Lipid Profile: No results for input(s): CHOL, HDL, LDLCALC, TRIG, CHOLHDL, LDLDIRECT in the last 72 hours. Thyroid Function Tests: No results for input(s): TSH, T4TOTAL, FREET4, T3FREE, THYROIDAB in the last  72 hours. Anemia Panel: No results for input(s): VITAMINB12, FOLATE, FERRITIN, TIBC, IRON, RETICCTPCT in the last 72 hours.    Radiology Studies: I have reviewed all of the imaging during  this hospital visit personally     Scheduled Meds: . acetaminophen (TYLENOL) oral liquid 160 mg/5 mL  650 mg Per Tube Q6H  . amiodarone  200 mg Per Tube Daily  . brimonidine  1 drop Left Eye BID   And  . timolol  1 drop Left Eye BID  . Chlorhexidine Gluconate Cloth  6 each Topical Q0600  . Chlorhexidine Gluconate Cloth  6 each Topical Q0600  . darbepoetin (ARANESP) injection - DIALYSIS  200 mcg Intravenous Q Sat-HD  . feeding supplement (PRO-STAT SUGAR FREE 64)  60 mL Per Tube BID  . heparin  3,200 Units Intravenous Once  . insulin aspart  0-9 Units Subcutaneous Q4H  . latanoprost  1 drop Both Eyes QHS  . pantoprazole (PROTONIX) IV  40 mg Intravenous QHS  . predniSONE  10 mg Per Tube Q breakfast  . sodium chloride flush  3 mL Intravenous Q12H  . sodium hypochlorite   Irrigation q morning - 10a   Continuous Infusions: . sodium chloride Stopped (10/11/18 0902)  . feeding supplement (VITAL AF 1.2 CAL) 1,000 mL (10/15/18 0547)  . heparin 1,050 Units/hr (10/17/18 1329)     LOS: 24 days        Dawnita Molner Gerome Apley, MD [facility] Pager [phone]

## 2018-10-18 NOTE — Progress Notes (Addendum)
Subjective: about to get hydroRx to large sacral decub  Objective Vital signs in last 24 hours: Vitals:   10/17/18 1300 10/17/18 1500 10/17/18 1700 10/18/18 0434  BP:    (!) 120/56  Pulse: 81 80 83 78  Resp: 17 16 20 19   Temp:    98.2 F (36.8 C)  TempSrc:    Oral  SpO2: 100% 100% 100% 99%  Weight:    49.4 kg  Height:       Weight change:   Intake/Output Summary (Last 24 hours) at 10/18/2018 1124 Last data filed at 10/17/2018 1630 Gross per 24 hour  Intake 0 ml  Output -  Net 0 ml   Physical Exam: General: soft spoken- - frail  Heart: RRR Lungs: mostly clear Abdomen: soft, non tender Extremities: min edema- tender to touch- bilat AKA Dialysis Access: right sided TDC     Dialysis Orders:  [facility] TTS  3.5h 61kg 400/800 Hep 1800 R TDC Mircera 225 q2weeks (last 09/14/18)  Summary Pt is a 73 y.o. yo female ESRD who was admitted on 09/24/2018 with FTT after AKA culminating with PNA and respiratory failure req vent   Assessment/Plan: 1. Pulm/ID - PNA and resp failure-  - extubated- sat 100%- WBC still up.  Now also C diff- on PO vanc  2. ESRD HD TTS: HD tomorrow 3. Anemia- req transfusion this hosp- on max darbe- no iron right now 4. Secondary hyperparathyroidism- phoslyra ordered, phos now within goal- even low, will hold phoslyra - no recent PTH data here 5. HTN/volume- BP stable, no bp meds. Well under dry wt here (48/61kg) 6. FTT- low albumin/decub that is large - has had palliative discussion but family requesting continued max efforts, cont with these discussions -difficult because is alert - FYI I do not think renal mass is of clinical significance- would not chase down.  Appreciate case management assist- yes she does need to be able to sit up for OP HD which she is not strong enough to do right now - possible LTACH candidate ?   Kelly Splinter MD [employer] pgr [phone]   10/18/2018, 11:26 AM        Labs: Basic Metabolic  Panel: Recent Labs  Lab 10/16/18 0325 10/17/18 0228 10/18/18 0244  NA 131* 136 137  K 3.3* 2.8* 3.9  CL 97* 100 102  CO2 22 24 22   GLUCOSE 243* 157* 108*  BUN 38* 14 37*  CREATININE 2.88* 1.35* 2.52*  CALCIUM 8.2* 8.1* 8.5*  PHOS 4.5 2.2* 3.7   Liver Function Tests: Recent Labs  Lab 10/12/18 0318  10/16/18 0325 10/17/18 0228 10/18/18 0244  AST 30  --   --   --   --   ALT 22  --   --   --   --   ALKPHOS 80  --   --   --   --   BILITOT 0.7  --   --   --   --   PROT 5.6*  --   --   --   --   ALBUMIN 1.5*   < > 1.6* 2.1* 1.9*   < > = values in this interval not displayed.   No results for input(s): LIPASE, AMYLASE in the last 168 hours. No results for input(s): AMMONIA in the last 168 hours. CBC: Recent Labs  Lab 10/14/18 0308 10/15/18 1124 10/16/18 0325 10/17/18 0228 10/18/18 0244  WBC 20.2* 17.7* 16.7* 14.1* 14.1*  HGB 8.8* 9.5* 9.5* 9.3* 9.7*  HCT 30.0* 31.9* 31.3* 31.7* 33.6*  MCV 92.0 94.7 93.7 92.7 95.2  PLT 291 230 208 172 193   Cardiac Enzymes: No results for input(s): CKTOTAL, CKMB, CKMBINDEX, TROPONINI in the last 168 hours. CBG: Recent Labs  Lab 10/17/18 1610 10/17/18 2027 10/18/18 0014 10/18/18 0431 10/18/18 0752  GLUCAP 112* 203* 143* 108* 204*    Iron Studies:  No results for input(s): IRON, TIBC, TRANSFERRIN, FERRITIN in the last 72 hours. Studies/Results: No results found. Medications: Infusions: . sodium chloride Stopped (10/11/18 0902)  . feeding supplement (VITAL AF 1.2 CAL) 1,000 mL (10/15/18 0547)  . heparin 1,050 Units/hr (10/17/18 1329)    Scheduled Medications: . acetaminophen (TYLENOL) oral liquid 160 mg/5 mL  650 mg Per Tube Q6H  . amiodarone  200 mg Per Tube Daily  . brimonidine  1 drop Left Eye BID   And  . timolol  1 drop Left Eye BID  . Chlorhexidine Gluconate Cloth  6 each Topical Q0600  . darbepoetin (ARANESP) injection - DIALYSIS  200 mcg Intravenous Q Sat-HD  . feeding supplement (PRO-STAT SUGAR FREE 64)  60  mL Per Tube BID  . heparin  3,200 Units Intravenous Once  . insulin aspart  0-9 Units Subcutaneous Q4H  . latanoprost  1 drop Both Eyes QHS  . pantoprazole (PROTONIX) IV  40 mg Intravenous QHS  . predniSONE  10 mg Per Tube Q breakfast  . sodium chloride flush  3 mL Intravenous Q12H  . sodium hypochlorite   Irrigation q morning - 10a    have reviewed scheduled and prn medications.

## 2018-10-19 ENCOUNTER — Inpatient Hospital Stay (HOSPITAL_COMMUNITY): Payer: Medicare Other

## 2018-10-19 DIAGNOSIS — L89303 Pressure ulcer of unspecified buttock, stage 3: Secondary | ICD-10-CM

## 2018-10-19 LAB — GLUCOSE, CAPILLARY
Glucose-Capillary: 107 mg/dL — ABNORMAL HIGH (ref 70–99)
Glucose-Capillary: 109 mg/dL — ABNORMAL HIGH (ref 70–99)
Glucose-Capillary: 136 mg/dL — ABNORMAL HIGH (ref 70–99)
Glucose-Capillary: 149 mg/dL — ABNORMAL HIGH (ref 70–99)
Glucose-Capillary: 179 mg/dL — ABNORMAL HIGH (ref 70–99)
Glucose-Capillary: 211 mg/dL — ABNORMAL HIGH (ref 70–99)

## 2018-10-19 LAB — CBC
HEMATOCRIT: 33 % — AB (ref 36.0–46.0)
Hemoglobin: 9.7 g/dL — ABNORMAL LOW (ref 12.0–15.0)
MCH: 28 pg (ref 26.0–34.0)
MCHC: 29.4 g/dL — ABNORMAL LOW (ref 30.0–36.0)
MCV: 95.1 fL (ref 80.0–100.0)
Platelets: 186 10*3/uL (ref 150–400)
RBC: 3.47 MIL/uL — ABNORMAL LOW (ref 3.87–5.11)
RDW: 22.5 % — ABNORMAL HIGH (ref 11.5–15.5)
WBC: 12 10*3/uL — ABNORMAL HIGH (ref 4.0–10.5)
nRBC: 0 % (ref 0.0–0.2)

## 2018-10-19 LAB — RENAL FUNCTION PANEL
Albumin: 1.9 g/dL — ABNORMAL LOW (ref 3.5–5.0)
Anion gap: 14 (ref 5–15)
BUN: 58 mg/dL — ABNORMAL HIGH (ref 8–23)
CO2: 18 mmol/L — AB (ref 22–32)
Calcium: 8.9 mg/dL (ref 8.9–10.3)
Chloride: 101 mmol/L (ref 98–111)
Creatinine, Ser: 3.43 mg/dL — ABNORMAL HIGH (ref 0.44–1.00)
GFR calc Af Amer: 15 mL/min — ABNORMAL LOW (ref >60)
GFR calc non Af Amer: 13 mL/min — ABNORMAL LOW (ref >60)
Glucose, Bld: 111 mg/dL — ABNORMAL HIGH (ref 70–99)
Phosphorus: 4.8 mg/dL — ABNORMAL HIGH (ref 2.5–4.6)
Potassium: 4 mmol/L (ref 3.5–5.1)
Sodium: 133 mmol/L — ABNORMAL LOW (ref 135–145)

## 2018-10-19 LAB — HEPARIN LEVEL (UNFRACTIONATED): Heparin Unfractionated: 0.43 IU/mL (ref 0.30–0.70)

## 2018-10-19 MED ORDER — DAKINS (1/4 STRENGTH) 0.125 % EX SOLN: Freq: Every morning | CUTANEOUS | 0 refills | Status: AC

## 2018-10-19 MED ORDER — APIXABAN 5 MG PO TABS
5.0000 mg | ORAL_TABLET | Freq: Two times a day (BID) | ORAL | Status: DC
  Administered 2018-10-19 – 2018-10-20 (×2): 5 mg via ORAL
  Filled 2018-10-19 (×3): qty 1

## 2018-10-19 MED ORDER — CHLORHEXIDINE GLUCONATE CLOTH 2 % EX PADS: 6.0000 | MEDICATED_PAD | Freq: Every day | CUTANEOUS | 0 refills | Status: DC

## 2018-10-19 MED ORDER — SUCRALFATE 1 GM/10ML PO SUSP: 1.0000 g | Freq: Three times a day (TID) | ORAL | 0 refills | Status: DC

## 2018-10-19 MED ORDER — VANCOMYCIN 50 MG/ML ORAL SOLUTION: 125.0000 mg | Freq: Four times a day (QID) | ORAL | 0 refills | Status: AC

## 2018-10-19 MED ORDER — HEPARIN SODIUM (PORCINE) 1000 UNIT/ML IJ SOLN
INTRAMUSCULAR | Status: AC
  Administered 2018-10-19: 1000 [IU]
  Filled 2018-10-19: qty 3

## 2018-10-19 MED ORDER — HEPARIN SODIUM (PORCINE) 1000 UNIT/ML IJ SOLN
INTRAMUSCULAR | Status: AC
  Filled 2018-10-19: qty 2

## 2018-10-19 MED ORDER — RESOURCE THICKENUP CLEAR PO POWD
ORAL | Status: DC | PRN
  Filled 2018-10-19: qty 125

## 2018-10-19 MED ORDER — SUCRALFATE 1 GM/10ML PO SUSP
1.0000 g | Freq: Three times a day (TID) | ORAL | Status: DC
  Administered 2018-10-19 – 2018-10-23 (×15): 1 g via ORAL
  Filled 2018-10-19 (×15): qty 10

## 2018-10-19 MED ORDER — PANTOPRAZOLE SODIUM 40 MG PO TBEC
40.0000 mg | DELAYED_RELEASE_TABLET | Freq: Two times a day (BID) | ORAL | Status: DC
  Administered 2018-10-19 – 2018-10-23 (×9): 40 mg via ORAL
  Filled 2018-10-19 (×9): qty 1

## 2018-10-19 MED ORDER — APIXABAN 2.5 MG PO TABS: 2.5000 mg | ORAL_TABLET | Freq: Two times a day (BID) | ORAL | Status: DC

## 2018-10-19 MED ORDER — VITAL AF 1.2 CAL PO LIQD: 1000.0000 mL | ORAL | 0 refills | Status: DC

## 2018-10-19 MED ORDER — VANCOMYCIN 50 MG/ML ORAL SOLUTION
125.0000 mg | Freq: Four times a day (QID) | ORAL | Status: DC
  Administered 2018-10-19 – 2018-10-23 (×15): 125 mg via ORAL
  Filled 2018-10-19 (×18): qty 2.5

## 2018-10-19 MED ORDER — HEPARIN SODIUM (PORCINE) 1000 UNIT/ML DIALYSIS
1800.0000 [IU] | Freq: Once | INTRAMUSCULAR | Status: AC
  Administered 2018-10-19: 1800 [IU] via INTRAVENOUS_CENTRAL

## 2018-10-19 NOTE — Progress Notes (Addendum)
Has has 2 small necorotic ares along inc. On left stump. Small amt. Of blood oozes from it at time.

## 2018-10-19 NOTE — Progress Notes (Signed)
Physical Therapy Wound Treatment Patient Details  Name: [name] MRN: [id number] Date of Birth: [birth year]  Today's Date: 10/19/2018 Time: 9292-4462 Time Calculation (min): 34 min  Subjective  Subjective: Pt asking for ice chips. Date of Onset: (unknown) Prior Treatments: dressing change  Pain Score: Pain Score: Pt premedicated. Pt with pain in side with positioning and then at wound with hydrotherapy/debridement.  Wound Assessment  Pressure Injury 09/27/18 Unstageable - Full thickness tissue loss in which the base of the ulcer is covered by slough (yellow, tan, gray, green or brown) and/or eschar (tan, brown or black) in the wound bed. (Active)  Dressing Type ABD;Barrier Film (skin prep);Tape dressing;Other (Comment) 10/19/2018 10:20 AM  Dressing Intact 10/19/2018 10:20 AM  Dressing Change Frequency Daily 10/19/2018 10:20 AM  State of Healing Eschar 10/19/2018 10:20 AM  Site / Wound Assessment Black;Red;Yellow 10/19/2018 10:20 AM  % Wound base Red or Granulating 5% 10/19/2018 10:20 AM  % Wound base Yellow/Fibrinous Exudate 80% 10/19/2018 10:20 AM  % Wound base Black/Eschar 15% 10/19/2018 10:20 AM  % Wound base Other/Granulation Tissue (Comment) 0% 10/19/2018 10:20 AM  Peri-wound Assessment Erythema (blanchable) 10/19/2018 10:20 AM  Wound Length (cm) 9.5 cm 10/14/2018 11:00 AM  Wound Width (cm) 9.3 cm 10/14/2018 11:00 AM  Wound Depth (cm) 0.1 cm 10/14/2018 11:00 AM  Wound Surface Area (cm^2) 88.35 cm^2 10/14/2018 11:00 AM  Wound Volume (cm^3) 8.84 cm^3 10/14/2018 11:00 AM  Tunneling (cm) 0 10/06/2018  8:00 PM  Undermining (cm) 0 10/06/2018  8:00 PM  Margins Unattached edges (unapproximated) 10/19/2018 10:20 AM  Drainage Amount Minimal 10/19/2018 10:20 AM  Drainage Description Green 10/19/2018 10:20 AM  Treatment Debridement (Selective);Hydrotherapy (Pulse lavage) 10/19/2018 10:20 AM   Dakins soaked guaze used.   Hydrotherapy Pulsed lavage therapy - wound location: sacrum Pulsed Lavage  with Suction (psi): 12 psi(8-12) Pulsed Lavage with Suction - Normal Saline Used: 1000 mL Pulsed Lavage Tip: Tip with splash shield Selective Debridement Selective Debridement - Location: sacrum Selective Debridement - Tools Used: Forceps;Scalpel Selective Debridement - Tissue Removed: yellow and black necrotic tissue   Wound Assessment and Plan  Wound Therapy - Assess/Plan/Recommendations Wound Therapy - Clinical Statement: Continue with removal of necrotic tissue of sacral wound. Continues to have some green drainage from wound. Pt with leaking of liquid stool around flexiseal which is frequently contaminating wound.   Wound Therapy - Functional Problem List: decreased mobility, decreased sitting balance Factors Delaying/Impairing Wound Healing: Diabetes Mellitus;Immobility;Multiple medical problems Hydrotherapy Plan: Debridement;Dressing change;Patient/family education;Pulsatile lavage with suction Wound Therapy - Frequency: 6X / week Wound Therapy - Follow Up Recommendations: Other (comment)(LTACH) Wound Plan: see above  Wound Therapy Goals- Improve the function of patient's integumentary system by progressing the wound(s) through the phases of wound healing (inflammation - proliferation - remodeling) by: Decrease Necrotic Tissue to: 80 Decrease Necrotic Tissue - Progress: Progressing toward goal Increase Granulation Tissue to: 20 Increase Granulation Tissue - Progress: Progressing toward goal  Goals will be updated until maximal potential achieved or discharge criteria met.  Discharge criteria: when goals achieved, discharge from hospital, MD decision/surgical intervention, no progress towards goals, refusal/missing three consecutive treatments without notification or medical reason.  GP     Shary Decamp [facility] 10/19/2018, 10:41 AM Aracely Rickett [place] Pager [phone] Office [phone]

## 2018-10-19 NOTE — Plan of Care (Signed)

## 2018-10-19 NOTE — Discharge Summary (Addendum)
Physician Discharge Summary  [name] XKG:[id number] DOB: [birth year] DOA: 09/24/2018  PCP: Sharilyn Sites, MD  Admit date: 09/24/2018 Discharge date: 10/19/2018  Admitted From: Home  Disposition:  LTAC  Recommendations for Outpatient Follow-up and new medication changes:  1. Follow up with Dr. Hilma Favors in 7 days.  2. Follow up with vascular surgery as scheduled  Home Health: na   Equipment/Devices: na    Discharge Condition: stable  CODE STATUS: full  Diet recommendation: patient on tube feedings.   Brief/Interim Summary: 73 year old female who presented with left foot pain and altered mental status.  She does have significant past medical history for hypertension, dyslipidemia, type 2 diabetes mellitus, COPD, GERD, gout, anemia, atrial fibrillation, DVT, end-stage renal disease on hemodialysis, (Tuesday Thursday Saturday), peripheral artery disease status post right AKA, lupus and heart failure.   Patient had a transmetatarsal amputation September 06, 2018, unfortunately at home she developed worsening left foot pain associated with drainage and fevers.  She was seen at the vascular surgery office, she was diagnosed with gangrene of the left foot and she was sent to the hospital for further evaluation.  December 29 she underwent left above-the-knee amputation, then she developed respiratory failure, worsening encephalopathy and required invasive mechanical ventilation.  She remained in the ventilator for about 10 days. (10/01/2018 to 10/11/2018)   Her hospital stay has been complicated by septic shock and acute hypoxic respiratory failure due to ESBL E. coli pneumonia, C. difficile colitis, acute on chronic anemia, land a large decubitus ulcer.    1.  Left ischemic limb status post above-the-knee amputation.  Patient was seen by vascular surgery, she underwent left above-the-knee amputation, had a prolonged hospital stay, continue local wound care, she was noted to have  necrotic lateral stump, she may need revision of the stump pending further changes secondary necrosis.  Continue dry dressings as needed.   2. Septic shock due to E Coli (ESBL), pneumonia, (not present on admission).  Antibiotic therapy with IV meropenem was completed during her hospitalization, she has remained very weak and deconditioned.  Today's oximetry monitoring is 100% on room air.  Denies any dyspnea.  3.  C. difficile diarrhea.  Patient was noted to have persistent diarrhea, C. difficile testing turned out positive on October 16, 2018, she has been placed on oral vancomycin.  She required Flexi-Seal/rectal tube, will plan continue vancomycin for next 10 days.   4.  Swallow dysfunction.  Patient failed her swallow evaluation, currently she does have a nasogastric tube for nutrition.  Continue speech therapy evaluation, she may need a PEG tube placement.  5.  Large sacral decubitus ulcer, no present on admission.  Continue local wound care.  Frequent turning,  6.  Atrial fibrillation.  Chronic, continue amiodarone and metoprolol for rate control, anticoagulation was held due to anemia, now it will be resumed at discharge.  7.  Type 2 diabetes mellitus.  Continue glucose monitoring and coverage with insulin sliding scale, her capillary glucose remained stable, continue tube feeds.  8.  Hypertension.  Pressure has been well controlled.  9.  End-stage disease on hemodialysis.  Continue hemodialysis, scheduled Tuesday Thursday Saturday.  She does have a right IJ tunnel catheter.  10.  Systemic lupus erythematosus.  Signs of exacerbation, continue prednisone and Plaquenil.  11.  Acute on chronic anemia.  Positive upper GI bleed secondary to NG tube trauma.  Patient required 1 unit of packed red blood cell transfusion, her anticoagulation with transitory held.  Discharge Diagnoses:  Principal  Problem:   Left foot infection Active Problems:   SLE (systemic lupus erythematosus) (HCC)    Essential hypertension, benign   COPD (chronic obstructive pulmonary disease) (HCC)   Chronic diastolic heart failure (HCC)   Goals of care, counseling/discussion   Mixed hyperlipidemia   Atrial fibrillation (HCC)   Type II diabetes mellitus with peripheral autonomic neuropathy (HCC)   ESRD on dialysis (HCC)   S/P AKA (above knee amputation) unilateral, right (HCC)   Status post amputation of left foot through metatarsal bone (HCC)   Pressure ulcer, stage 3 (HCC)   GERD (gastroesophageal reflux disease)   Gout   S/P AKA (above knee amputation) bilateral (HCC)   Acute on chronic anemia   Supplemental oxygen dependent   Hypertension   Acute on chronic diastolic CHF (congestive heart failure) (HCC)   FTT (failure to thrive) in adult   Oral candidiasis   Dysphagia   S/P AKA (above knee amputation) unilateral, left ([place])   Palliative care by specialist    Discharge Instructions   Allergies as of 10/19/2018      Reactions   Ace Inhibitors Cough   Fentanyl    Fentanyl PATCH causes lethargy, somnolence, and AMS. Tolerated IV Fentanyl in small increments.      Medication List    STOP taking these medications   fentaNYL 25 MCG/HR Commonly known as:  DURAGESIC   Oxycodone HCl 10 MG Tabs     TAKE these medications   acetaminophen 325 MG tablet Commonly known as:  TYLENOL Take 1-2 tablets (325-650 mg total) by mouth every 4 (four) hours as needed for mild pain. What changed:  reasons to take this   albuterol 108 (90 Base) MCG/ACT inhaler Commonly known as:  PROVENTIL HFA;VENTOLIN HFA Inhale 2 puffs into the lungs every 6 (six) hours as needed for wheezing or shortness of breath.   allopurinol 100 MG tablet Commonly known as:  ZYLOPRIM Take 100 mg by mouth every Monday, Wednesday, and Friday.   amiodarone 200 MG tablet Commonly known as:  PACERONE Take 1 tablet (200 mg total) by mouth daily.   apixaban 5 MG Tabs tablet Commonly known as:  ELIQUIS Take 1 tablet (5 mg  total) by mouth 2 (two) times daily.   atorvastatin 20 MG tablet Commonly known as:  LIPITOR Take 20 mg by mouth at bedtime.   carbamazepine 100 MG 12 hr tablet Commonly known as:  TEGRETOL XR Take 100 mg by mouth 2 (two) times daily.   Chlorhexidine Gluconate Cloth 2 % Pads Apply 6 each topically daily at 6 (six) AM. Start taking on:  October 20, 2018   Chlorhexidine Gluconate Cloth 2 % Pads Apply 6 each topically daily at 6 (six) AM. Start taking on:  October 20, 2018   COMBIGAN 0.2-0.5 % ophthalmic solution Generic drug:  brimonidine-timolol Place 1 drop into the left eye 2 (two) times daily.   docusate sodium 100 MG capsule Commonly known as:  COLACE Take 100 mg by mouth 2 (two) times daily.   feeding supplement (PRO-STAT SUGAR FREE 64) Liqd Take 30 mLs by mouth 2 (two) times daily.   feeding supplement (VITAL AF 1.2 CAL) Liqd Place 1,000 mLs into feeding tube continuous for 30 days.   folic acid 814 MCG tablet Commonly known as:  FOLVITE Take 800 mcg by mouth daily.   hydroxychloroquine 200 MG tablet Commonly known as:  PLAQUENIL Take 200 mg by mouth daily.   latanoprost 0.005 % ophthalmic solution Commonly known as:  Ivin Poot  Place 1 drop into both eyes at bedtime.   metoprolol tartrate 25 MG tablet Commonly known as:  LOPRESSOR Take 1/2 tablet twice a day on Mondays, Wednesdays and Fridays What changed:    how much to take  how to take this  when to take this  additional instructions   multivitamin Tabs tablet Take 1 tablet by mouth daily.   NOVOLOG FLEXPEN 100 UNIT/ML FlexPen Generic drug:  insulin aspart Inject 5 Units into the skin 3 (three) times daily with meals as needed (if BGL is over 100).   pantoprazole 40 MG tablet Commonly known as:  PROTONIX Take 1 tablet (40 mg total) by mouth daily.   predniSONE 5 MG tablet Commonly known as:  DELTASONE Take 5 mg by mouth daily with breakfast.   sodium hypochlorite 0.125 % Soln Commonly  known as:  DAKIN'S 1/4 STRENGTH Irrigate with as directed every morning.   sucralfate 1 GM/10ML suspension Commonly known as:  CARAFATE Take 10 mLs (1 g total) by mouth 4 (four) times daily -  with meals and at bedtime for 30 days.   UNIFINE PENTIPS 31G X 6 MM Misc Generic drug:  Insulin Pen Needle USE AS DIRECTED AT BEDTIME WITH LEVEMIR AND WITH SLIDING SCALE INSULIN. What changed:  See the new instructions.   vancomycin 50 mg/mL  oral solution Commonly known as:  VANCOCIN Take 2.5 mLs (125 mg total) by mouth every 6 (six) hours for 10 days.       Allergies  Allergen Reactions  . Ace Inhibitors Cough  . Fentanyl     Fentanyl PATCH causes lethargy, somnolence, and AMS. Tolerated IV Fentanyl in small increments.    Consultations:  ID  Nephrology   Wound Care     Procedures/Studies: Dg Abd 1 View  Result Date: 10/07/2018 CLINICAL DATA:  Check OG placement EXAM: ABDOMEN - 1 VIEW COMPARISON:  10/01/2018 FINDINGS: Gastric catheter is noted with the tip in the mid stomach. No obstructive changes are seen. Scattered vascular calcifications are noted. Degenerative change of the lumbar spine is seen. IMPRESSION: OG placement within the stomach. Electronically Signed   By: Inez Catalina M.D.   On: 10/07/2018 21:48   Dg Abd 1 View  Result Date: 09/25/2018 CLINICAL DATA:  Abdominal and right hip pain. EXAM: ABDOMEN - 1 VIEW COMPARISON:  07/28/2018 CT and 12/16/2017 abdomen radiograph FINDINGS: Scattered air containing small large bowel without bowel obstruction. Lumbar spondylosis with degenerative disc disease is identified greatest at L2-3. Mild joint space narrowing of both hips. No radiopaque calculi nor acute osseous abnormality. IMPRESSION: Nonspecific bowel gas pattern without bowel obstruction. Lumbar spondylosis. Electronically Signed   By: [place] Royalty M.D.   On: 09/25/2018 22:37   Ct Head Wo Contrast  Result Date: 10/05/2018 CLINICAL DATA:  Initial evaluation for acute  altered mental status. EXAM: CT HEAD WITHOUT CONTRAST TECHNIQUE: Contiguous axial images were obtained from the base of the skull through the vertex without intravenous contrast. COMPARISON:  Prior CT from 09/24/2018. FINDINGS: Brain: Atrophy with chronic microvascular ischemic disease, stable. Subcentimeter left parafalcine meningioma noted as well, unchanged. No acute intracranial hemorrhage. No acute large vessel territory infarct. No other mass lesion. No midline shift or mass effect. No hydrocephalus. No extra-axial fluid collection. Vascular: No hyperdense vessel. Scattered vascular calcifications noted within the carotid siphons. Skull: Scalp soft tissues and calvarium within normal limits. Sinuses/Orbits: Globes orbital soft tissues demonstrate no acute finding. Left ocular esotropia noted. Paranasal sinuses and mastoid air cells are clear. Other:  None. IMPRESSION: 1. Stable appearance of the head. No acute intracranial abnormality identified. 2. Underlying atrophy with chronic small vessel ischemic disease. Unchanged small left parafalcine meningioma. Electronically Signed   By: Jeannine Boga M.D.   On: 10/05/2018 01:56   Ct Head Wo Contrast  Result Date: 09/24/2018 CLINICAL DATA:  Encephalopathy EXAM: CT HEAD WITHOUT CONTRAST TECHNIQUE: Contiguous axial images were obtained from the base of the skull through the vertex without intravenous contrast. COMPARISON:  MRI 12/26/2017 FINDINGS: Brain: Small left parafalcine meningioma measuring 7 x 3 x 6 mm, stable in appearance. Patchy periventricular white matter hypodensities compatible with chronic microvascular ischemia. No large vascular territory infarct. Chronic involutional changes brain with sulcal and ventricular prominence. Intra-axial mass nor extra-axial fluid. No acute intracranial hemorrhage. Small chronic basal ganglia lacunar infarcts are again noted. Midline fourth ventricle basal cisterns without effacement. Brainstem and cerebellum  appear nonacute. Vascular: No hyperdense vessel sign. Skull: Intact Sinuses/Orbits: Bilateral lens replacements. Slight lateral deviation of gaze involving the right globe. No acute sinus disease. Other: No significant calvarial soft tissue swelling. IMPRESSION: 1. Stable small left parafalcine meningioma measuring 7 x 3 x 6 mm. 2. Chronic small vessel ischemic disease. No acute intracranial abnormality. Electronically Signed   By: [place] Royalty M.D.   On: 09/24/2018 21:44   Dg Chest Port 1 View  Result Date: 10/12/2018 CLINICAL DATA:  Acute onset shortness of breath. In stage renal disease on hemodialysis. EXAM: PORTABLE CHEST 1 VIEW COMPARISON:  10/02/2018 and earlier. FINDINGS: Cardiac silhouette markedly enlarged, unchanged. Thoracic aorta atherosclerotic, unchanged. Hilar and mediastinal contours otherwise unremarkable. Pulmonary venous hypertension without overt edema currently. Streaky and patchy opacities at the MEDIAL RIGHT lung base. Lungs otherwise clear. Small BILATERAL pleural effusions, unchanged. RIGHT jugular dialysis catheter tips project at or near the cavoatrial junction, unchanged. Interval extubation and nasogastric tube removal. IMPRESSION: 1. Atelectasis and/or bronchopneumonia at the MEDIAL RIGHT lung base. Stable small BILATERAL pleural effusions. No acute cardiopulmonary disease otherwise. 2. Stable marked cardiomegaly. Pulmonary venous hypertension without overt edema currently. Electronically Signed   By: Evangeline Dakin M.D.   On: 10/12/2018 09:03   Dg Chest Port 1 View  Result Date: 10/02/2018 CLINICAL DATA:  Respiratory failure. EXAM: PORTABLE CHEST 1 VIEW COMPARISON:  10/01/1998 FINDINGS: Endotracheal tube remains present with the tip roughly 5 cm above the carina. Gastric decompression tube extends below the diaphragm. Tunneled dialysis catheter stable in appearance. Stable cardiac enlargement. Lungs show suggestion of potential mild interstitial edema and left lower lobe  atelectasis. No pleural effusions or pneumothorax. IMPRESSION: Possible mild pulmonary interstitial edema and left lower lobe atelectasis. Electronically Signed   By: Aletta Edouard M.D.   On: 10/02/2018 08:07   Dg Chest Port 1 View  Result Date: 10/01/2018 CLINICAL DATA:  Acute respiratory failure with hypoxia. Follow-up exam. EXAM: PORTABLE CHEST 1 VIEW COMPARISON:  10/01/2018 at 1313 hours. FINDINGS: Since prior exam, an endotracheal tube has been placed. Tip projects 2.7 cm above the Carina. There is also new nasal/orogastric tube, which passes below the diaphragm well into the stomach. Stable mild cardiomegaly. Lungs demonstrate mildly prominent bronchovascular markings but no evidence of pneumonia or pulmonary edema. Right internal jugular dual-lumen tunneled central venous catheter is stable. No pneumothorax or pleural effusion. IMPRESSION: 1. New endotracheal tube and nasal/orogastric tube are both well positioned. 2. No other change from the earlier exam. No acute findings in the lungs. Electronically Signed   By: Lajean Manes M.D.   On: 10/01/2018 17:58   Dg  Chest Port 1 View  Result Date: 10/01/2018 CLINICAL DATA:  Shortness of breath. EXAM: PORTABLE CHEST 1 VIEW COMPARISON:  Radiograph September 27, 2018. FINDINGS: Stable cardiomegaly. No pneumothorax or pleural effusion is noted. Right internal jugular dialysis catheter is unchanged in position. Both lungs are clear. The visualized skeletal structures are unremarkable. IMPRESSION: No acute cardiopulmonary abnormality seen. Aortic Atherosclerosis (ICD10-I70.0). Electronically Signed   By: Marijo Conception, M.D.   On: 10/01/2018 13:39   Dg Chest Port 1 View  Result Date: 09/27/2018 CLINICAL DATA:  Chest congestion and cough began this morning. EXAM: PORTABLE CHEST 1 VIEW COMPARISON:  Portable chest x-ray of September 24, 2018 FINDINGS: The lungs are well-expanded. Slightly increased lung markings are noted greatest on the left. There is a small  amount of thickening of the minor fissure. The cardiac silhouette is mildly enlarged though stable. The pulmonary vascularity is slightly more conspicuous. The dialysis catheter tip projects over the distal third of the SVC. There is calcification in the wall of the tortuous thoracic aorta. IMPRESSION: Findings suggest low-grade CHF with mild asymmetric interstitial edema greatest on the left. Stable enlargement of the cardiac silhouette. No pleural effusion. Thoracic aortic atherosclerosis. Electronically Signed   By: David  Martinique M.D.   On: 09/27/2018 08:09   Dg Chest Port 1 View  Result Date: 09/24/2018 CLINICAL DATA:  Fever over diabetic foot wound. EXAM: PORTABLE CHEST 1 VIEW COMPARISON:  03/01/2018 FINDINGS: Stable cardiomegaly with aortic atherosclerosis. Right IJ approach dialysis catheter is noted with tip in the proximal right atrium. Lungs are clear without edema or consolidation. No effusion or pneumothorax. No acute osseous appearing abnormality. IMPRESSION: No active disease.  Stable cardiomegaly with aortic atherosclerosis. Electronically Signed   By: [place] Royalty M.D.   On: 09/24/2018 17:50   Dg Abd Portable 1v  Result Date: 10/11/2018 CLINICAL DATA:  NG tube placement EXAM: PORTABLE ABDOMEN - 1 VIEW COMPARISON:  10/11/2018 FINDINGS: The enteric tube previously visualized over the right heart has been repositioned and advanced. Tube tip is now in the right lower lung suggesting position in a right lower lobe bronchus. Removal and repositioning is recommended. IMPRESSION: Enteric tube tip is in the right lower lung suggesting position in a right lower lobe bronchus. Removal and repositioning is recommended. These results were called by telephone at the time of interpretation on 10/11/2018 at 11:05 pm to Dr. Corinna Lines , who verbally acknowledged these results. Electronically Signed   By: Lucienne Capers M.D.   On: 10/11/2018 23:14   Dg Abd Portable 1v  Result Date: 10/11/2018 CLINICAL DATA:   NG tube placement EXAM: PORTABLE ABDOMEN - 1 VIEW COMPARISON:  10/07/2018 FINDINGS: A coiled segment of tubing, possibly the enteric tube, is demonstrated at the edge of the image and projected over the right heart. This could represent tube placement in the right mainstem bronchus. Chest radiograph suggested for further evaluation, or could remove and reposition the tube. No tubing demonstrated in the expected location of the stomach. Scattered gas in the colon and small bowel without distention. No radiopaque stones. Degenerative changes in the spine. IMPRESSION: Enteric tube appears to be coiled in the right mainstem bronchus. Recommend chest radiograph for further evaluation or removal and repositioning of the tube. Electronically Signed   By: Lucienne Capers M.D.   On: 10/11/2018 23:02   Dg Abd Portable 1v  Result Date: 10/01/2018 CLINICAL DATA:  Shortness of breath and fever. EXAM: PORTABLE ABDOMEN - 1 VIEW COMPARISON:  09/25/2018 FINDINGS: Bowel gas pattern  is nonobstructive. No evidence of free peritoneal air. Degenerative change of the spine and hips. IMPRESSION: Nonobstructive bowel gas pattern. Electronically Signed   By: Marin Olp M.D.   On: 10/01/2018 13:32   Dg Swallowing Func-speech Pathology  Result Date: 10/12/2018 Objective Swallowing Evaluation: Type of Study: MBS-Modified Barium Swallow Study  Patient Details Name: JAMARRIA REAL MRN: [id number] Date of Birth: 12-09-1945 Today's Date: 10/12/2018 Time: SLP Start Time (ACUTE ONLY): 1300 -SLP Stop Time (ACUTE ONLY): 1330 SLP Time Calculation (min) (ACUTE ONLY): 30 min Past Medical History: Past Medical History: Diagnosis Date . Anemia  . Arthritis  . Asthma  . Atrial fibrillation ([place])  . Atrial flutter ([place])   a. s/p TEE/DCCV December 2013 b. 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 Bone spur of toe   Right 5th toe . Chronic diastolic CHF (congestive heart failure) (HCC)   a. EF 50-55% by echo in 2015 . Colon polyposis  . COPD (chronic obstructive pulmonary disease) ([place])  . DVT of upper extremity (deep vein thrombosis) ([place])   Right basilic vein, June 3785 . ESRD (end stage renal disease) on dialysis ([place])   "TTF; ; off [employer]." (09/07/2018) . Essential hypertension, benign  . Fractures  . Glaucoma  . Lupus ([place])  . Pericarditis  . SLE (systemic lupus erythematosus) ([place])  . Type 2 diabetes mellitus ([place])  . UTI (lower urinary tract infection)  . Wound of right leg 06/2017 Past Surgical History: Past Surgical History: Procedure Laterality Date . ABDOMINAL AORTOGRAM W/LOWER EXTREMITY N/A 12/18/2017  Procedure: ABDOMINAL AORTOGRAM W/LOWER EXTREMITY;  Surgeon: Elam Dutch, MD;  Location: [facility];  Service: Cardiovascular;  Laterality: N/A;  bilateral . ABDOMINAL HYSTERECTOMY  1983 . AMPUTATION Right 06/08/2018  Procedure: RIGHT ABOVE KNEE AMPUTATION;  Surgeon: Elam Dutch, MD;  [facility];  Service: Vascular;  Laterality: Right; . AMPUTATION Left 09/26/2018  Procedure: AMPUTATION ABOVE KNEE;  Surgeon: Rosetta Posner, MD;  Location: [facility] OR;  Service: Vascular;  Laterality: Left;  WILL BE AN INPATIENT . ANKLE SURGERY  1993 . AV FISTULA PLACEMENT Left 03/27/2014  Procedure: ARTERIOVENOUS (AV) FISTULA CREATION;  Surgeon: Rosetta Posner, MD;  Location: [facility];  Service: Vascular;  Laterality: Left; . BACK SURGERY  1980 . BASCILIC VEIN TRANSPOSITION Left 01/13/2018  Procedure: LEFT BASILIC VEIN TRANSPOSITION SECOND STAGE;  Surgeon: Elam Dutch, MD;  Location: [facility];  Service: Vascular;  Laterality: Left; . CARDIOVASCULAR STRESS TEST  12/19/2009  no stress induced rhythm abnormalities, ekg negative for ischemia . CARDIOVERSION  09/16/2012  Procedure: CARDIOVERSION;  Surgeon: Sanda Klein, MD;  Location: [facility]OS[facility];  Service: Cardiovascular;   Laterality: N/A; . CATARACT EXTRACTION   . COLONOSCOPY  09/20/2012  Dr. [name]: multiple tubular adenomas  . COLONOSCOPY  2005  Dr. [name]. Polyps, path unknown  . COLONOSCOPY N/A 07/24/2016  Dr. [name]: 3 significant size polyps removed from the colon, tubular adenomas.  Next colonoscopy in October 2020. Marland Kitchen ESOPHAGOGASTRODUODENOSCOPY  09/20/2012  Dr. [name]: duodenal erosion and possible resolving ulcer at the angularis  . ESOPHAGOGASTRODUODENOSCOPY N/A 03/24/2018  Dr. [name]: Mild erosive reflux esophagitis, erosive gastropathy and enteropathy fairly extensive, no H. pylori on biopsy. Marland Kitchen EYE SURGERY   . IR FLUORO GUIDE CV LINE RIGHT  11/09/2017 . IR RADIOLOGIST EVAL & MGMT  09/01/2018 . IR US GUIDE VASC  ACCESS RIGHT  11/09/2017 . REVISON OF ARTERIOVENOUS FISTULA Left 03/15/2018  Procedure: REVISION OF Left arm BASILIC VEIN TRANSPOSITION;  Surgeon: Angelia Mould, MD;  Location: [facility];  Service: Vascular;  Laterality: Left; . TEE WITHOUT CARDIOVERSION  09/16/2012  Procedure: TRANSESOPHAGEAL ECHOCARDIOGRAM (TEE);  Surgeon: Sanda Klein, MD;  Location[facility];  Service: Cardiovascular;  Laterality: N/A; . TRANSESOPHAGEAL ECHOCARDIOGRAM WITH CARDIOVERSION  09/16/2012  EF 60-65%, moderate LVH, moderate regurg of the aortic valve, LA moderately dilated . TRANSMETATARSAL AMPUTATION Left 09/06/2018  Procedure: TRANSMETATARSAL AMPUTATION;  Surgeon: Angelia Mould, MD;  Location: [facility] OR;  Service: Vascular;  Laterality: Left; HPI: Pt is a 73 y.o. female admitted with progressive gangrene of her left foot and is status post AKA on 09/26/2018. She has demonstrated poor p.o. intake, oral thush, as well as " significant difficulty with secretion clearance, evolving poor cough and increased work of breathing in addition to malnutrition". She developed encephalopathy, was unable to clear airway secretions and required intubation on 1/3 with extubation on 1/13. Nursing reported that NG tube placement was attempted  four times without success.  Subjective: alert, follows commands Assessment / Plan / Recommendation CHL IP CLINICAL IMPRESSIONS 10/12/2018 Clinical Impression Pt presents with a post-extubation dysphagia characterized by aspiration of tspn-sized boluses of nectar and thin liquids during the swallow.  There is adequate oral control, swallow onset, and pharyngeal constriction, but consistent spilling of materials into posterior larynx/trachea with no cough response elicited.  This pattern of aspiration is typical of post-extubation dysphagia, marked by probable glottal incompetence (corresponding with clinical sign of aphonia) and leading to aspiration between the posterior vocal folds without sensation.  Purees, due to their thicker nature, were not aspirated.  If necessary, PO meds may be given crushed in puree.  Pt would benefit from a cortrak if it can be successfully placed - she is not yet safe to begin a PO diet.  SLP will follow for PO readiness/diet progression.   SLP Visit Diagnosis Dysphagia, pharyngeal phase (R13.13) Attention and concentration deficit following -- Frontal lobe and executive function deficit following -- Impact on safety and function Severe aspiration risk   [facility] IP TREATMENT RECOMMENDATION 10/12/2018 Treatment Recommendations Therapy as outlined in treatment plan below   Prognosis 10/12/2018 Prognosis for Safe Diet Advancement Good Barriers to Reach Goals -- Barriers/Prognosis Comment -- [facility] IP DIET RECOMMENDATION 10/12/2018 SLP Diet Recommendations NPO except meds Liquid Administration via -- Medication Administration Crushed with puree Compensations -- Postural Changes --   [facility] IP OTHER RECOMMENDATIONS 10/12/2018 Recommended Consults -- Oral Care Recommendations Oral care QID Other Recommendations --   [facility] IP FOLLOW UP RECOMMENDATIONS 09/29/2018 Follow up Recommendations Skilled Nursing facility   [facility] IP FREQUENCY AND DURATION 10/12/2018 Speech Therapy Frequency (ACUTE ONLY) min 2x/week  Treatment Duration 2 weeks      [facility] IP ORAL PHASE 10/12/2018 Oral Phase WFL Oral - Pudding Teaspoon -- Oral - Pudding Cup -- Oral - Honey Teaspoon -- Oral - Honey Cup -- Oral - Nectar Teaspoon -- Oral - Nectar Cup -- Oral - Nectar Straw -- Oral - Thin Teaspoon -- Oral - Thin Cup -- Oral - Thin Straw -- Oral - Puree -- Oral - Mech Soft -- Oral - Regular -- Oral - Multi-Consistency -- Oral - Pill -- Oral Phase - Comment --  [facility] IP PHARYNGEAL PHASE 10/12/2018 Pharyngeal Phase Impaired Pharyngeal- Pudding Teaspoon -- Pharyngeal -- Pharyngeal- Pudding Cup -- Pharyngeal -- Pharyngeal- Honey Teaspoon Delayed swallow initiation-vallecula;Reduced airway/laryngeal closure;Penetration/Aspiration during swallow;Moderate aspiration  Pharyngeal Material enters airway, passes BELOW cords without attempt by patient to eject out (silent aspiration) Pharyngeal- Honey Cup -- Pharyngeal -- Pharyngeal- Nectar Teaspoon Delayed swallow initiation-vallecula;Reduced airway/laryngeal closure;Penetration/Aspiration during swallow;Moderate aspiration Pharyngeal Material enters airway, passes BELOW cords without attempt by patient to eject out (silent aspiration) Pharyngeal- Nectar Cup -- Pharyngeal -- Pharyngeal- Nectar Straw -- Pharyngeal -- Pharyngeal- Thin Teaspoon -- Pharyngeal -- Pharyngeal- Thin Cup -- Pharyngeal -- Pharyngeal- Thin Straw -- Pharyngeal -- Pharyngeal- Puree Delayed swallow initiation-vallecula Pharyngeal -- Pharyngeal- Mechanical Soft -- Pharyngeal -- Pharyngeal- Regular -- Pharyngeal -- Pharyngeal- Multi-consistency -- Pharyngeal -- Pharyngeal- Pill -- Pharyngeal -- Pharyngeal Comment --  No flowsheet data found. Juan Quam Laurice 10/12/2018, 5:21 PM              Dg Hip Unilat With Pelvis 1v Right  Result Date: 09/25/2018 CLINICAL DATA:  Right hip pain EXAM: DG HIP (WITH OR WITHOUT PELVIS) 1V RIGHT COMPARISON:  None. FINDINGS: L4-5 degenerative disc disease with lower lumbar facet arthropathy is seen. The bony  pelvis appears intact without acute fracture or suspicious osseous lesions. No pelvic diastasis is noted. Mild-to-moderate joint space narrowing of both hips appears stable. Mild subchondral sclerosis of the femoral heads are identified without flattening. Findings may be degenerative in etiology. Subtle changes of AVN are not entirely excluded. Soft tissue calcification adjacent to the greater trochanter of the right femur may reflect gluteal tendinosis. IMPRESSION: 1. No acute osseous abnormality of the bony pelvis and right hip. 2. Mild-to-moderate degenerative joint space narrowing of both hips. Subchondral sclerosis of the femoral heads bilaterally without flattening may reflect osteoarthritis. Changes of AVN are not entirely excluded. 3. Soft tissue calcification adjacent to the greater trochanter of the right femur may reflect gluteal tendinosis. Electronically Signed   By: Ashley Royalty M.D.   On: 09/25/2018 22:40       Subjective: Patient continue to be very weak and deconditioned, no nausea or vomiting, continue to have diarrhea.   Discharge Exam: Vitals:   10/19/18 0538 10/19/18 0543  BP: (!) 133/25 (!) 135/30  Pulse: 91   Resp: (!) 25   Temp: 97.6 F (36.4 C)   SpO2: 100%    Vitals:   10/18/18 1407 10/19/18 0538 10/19/18 0543 10/19/18 0646  BP: 121/63 (!) 133/25 (!) 135/30   Pulse: 80 91    Resp: (!) 24 (!) 25    Temp: 97.6 F (36.4 C) 97.6 F (36.4 C)    TempSrc: Oral Axillary    SpO2: 100% 100%    Weight:    42.6 kg  Height:        General: deconditioned  Neurology: Awake and alert, non focal  E ENT: mild pallor, no icterus, oral mucosa moist/ ng tube in place Cardiovascular: No JVD. S1-S2 present, rhythmic, no gallops, rubs, or murmurs. No lower extremity edema. Pulmonary: positive breath sounds bilaterally,  no wheezing, rhonchi or rales. Gastrointestinal. Abdomen with no organomegaly, non tender, no rebound or guarding Skin. No rashes Musculoskeletal:  bilateral   The results of significant diagnostics from this hospitalization (including imaging, microbiology, ancillary and laboratory) are listed below for reference.     Microbiology: No results found for this or any previous visit (from the past 240 hour(s)).   Labs: BNP (last 3 results) Recent Labs    11/03/17 0801  BNP 6,812.7*   Basic Metabolic Panel: Recent Labs  Lab 10/13/18 0231  10/15/18 1124 10/16/18 0325 10/17/18 0228 10/18/18 0244 10/19/18 0330  NA 133*   < >  136 131* 136 137 133*  K 4.3   < > 2.9* 3.3* 2.8* 3.9 4.0  CL 96*   < > 98 97* 100 102 101  CO2 23   < > 25 22 24 22  18*  GLUCOSE 191*   < > 190* 243* 157* 108* 111*  BUN 23   < > 25* 38* 14 37* 58*  CREATININE 2.26*   < > 2.11* 2.88* 1.35* 2.52* 3.43*  CALCIUM 8.2*   < > 8.1* 8.2* 8.1* 8.5* 8.9  MG 1.9  --   --   --   --   --   --   PHOS 5.2*   < > 4.0 4.5 2.2* 3.7 4.8*   < > = values in this interval not displayed.   Liver Function Tests: Recent Labs  Lab 10/15/18 1124 10/16/18 0325 10/17/18 0228 10/18/18 0244 10/19/18 0330  ALBUMIN 1.6* 1.6* 2.1* 1.9* 1.9*   No results for input(s): LIPASE, AMYLASE in the last 168 hours. No results for input(s): AMMONIA in the last 168 hours. CBC: Recent Labs  Lab 10/15/18 1124 10/16/18 0325 10/17/18 0228 10/18/18 0244 10/19/18 0330  WBC 17.7* 16.7* 14.1* 14.1* 12.0*  HGB 9.5* 9.5* 9.3* 9.7* 9.7*  HCT 31.9* 31.3* 31.7* 33.6* 33.0*  MCV 94.7 93.7 92.7 95.2 95.1  PLT 230 208 172 193 186   Cardiac Enzymes: No results for input(s): CKTOTAL, CKMB, CKMBINDEX, TROPONINI in the last 168 hours. BNP: Invalid input(s): POCBNP CBG: Recent Labs  Lab 10/18/18 1654 10/18/18 1936 10/19/18 0015 10/19/18 0329 10/19/18 0803  GLUCAP 211* 193* 149* 109* 179*   D-Dimer No results for input(s): DDIMER in the last 72 hours. Hgb A1c No results for input(s): HGBA1C in the last 72 hours. Lipid Profile No results for input(s): CHOL, HDL, LDLCALC, TRIG,  CHOLHDL, LDLDIRECT in the last 72 hours. Thyroid function studies No results for input(s): TSH, T4TOTAL, T3FREE, THYROIDAB in the last 72 hours.  Invalid input(s): FREET3 Anemia work up No results for input(s): VITAMINB12, FOLATE, FERRITIN, TIBC, IRON, RETICCTPCT in the last 72 hours. Urinalysis    Component Value Date/Time   COLORURINE AMBER (A) 09/26/2018 0630   APPEARANCEUR TURBID (A) 09/26/2018 0630   LABSPEC 1.018 09/26/2018 0630   PHURINE 5.0 09/26/2018 0630   GLUCOSEU NEGATIVE 09/26/2018 0630   HGBUR LARGE (A) 09/26/2018 0630   BILIRUBINUR NEGATIVE 09/26/2018 0630   KETONESUR NEGATIVE 09/26/2018 0630   PROTEINUR 100 (A) 09/26/2018 0630   UROBILINOGEN 1.0 03/17/2014 1830   NITRITE NEGATIVE 09/26/2018 0630   LEUKOCYTESUR LARGE (A) 09/26/2018 0630   Sepsis Labs Invalid input(s): PROCALCITONIN,  WBC,  LACTICIDVEN Microbiology No results found for this or any previous visit (from the past 240 hour(s)).   Time coordinating discharge: 45 minutes  SIGNED:   Tawni Millers, MD  [facility] 10/19/2018, 10:06 AM Pager [phone]  If 7PM-7AM, please contact night-coverage [email or web address] Password [email or web address]

## 2018-10-19 NOTE — Progress Notes (Addendum)
Verified with pharmacist not to administer Eliquis. Patient has scheduled night time dose ordered.

## 2018-10-19 NOTE — Progress Notes (Signed)
Modified Barium Swallow Progress Note  Patient Details  Name: [name] MRN: [id number] Date of Birth: 1946/01/30  Today's Date: 10/19/2018  Modified Barium Swallow completed.  Full report located under Chart Review in the Imaging Section.  Brief recommendations include the following:  Clinical Impression  Patient has been NPO at bedside with cortrac present for nutrition and ice chips with nursing. 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  Patient is also on a specialty bed for a large decubitus ulcer impacting her ability to sit at 90 degrees for meals. Patient has a mild to moderate oropharyngeal dysphagia likely impacted by long intubation as well as deconditioned state after long hospitalization. She required 2 swallows to clear thin liquid bolus, but only one with thickened liquids. Delayed swallow initiation at the pyriform sinuses with thin liquids and the valleculae for all other consistencies. Reduced laryngeal elevation and anterior movement resulting in poor laryngeal closure and airway protection across all consistencies. Small amounts of consistent penetration of thin and nectar thickened liquids with no triggered cough response and eventually aspirated below the cords. Patient had a delayed cough in response to thin liquid passing below the cords but unable to fully clear. Mild pharyngeal residue impacted by NG tube. Patient has a fair to good prognosis for swallow to improve as her over all medical status improves, NG tube is removed and her ability to sit for longer periods improves. Recommend a Dys 2, Honey thickened diet with medications given whole in puree. Speech therapy to follow up for diet tolerance, aspiration precaution training, pharyngeal exercise and diet upgrade.    Swallow Evaluation Recommendations       SLP Diet Recommendations: Dysphagia 2 (Fine chop) solids;Honey thick liquids;Ice chips  PRN after oral care   Liquid Administration via: Cup   Medication Administration: Whole meds with puree   Supervision: Staff to assist with self feeding   Compensations: Slow rate;Small sips/bites   Postural Changes: Seated upright at 90 degrees   Oral Care Recommendations: Oral care QID   Other Recommendations: Order thickener from [place], [place], CCC-SLP 10/19/2018 10:58 AM

## 2018-10-19 NOTE — Progress Notes (Signed)
Subjective: about to get hydroRx again  Objective Vital signs in last 24 hours: Vitals:   10/18/18 1407 10/19/18 0538 10/19/18 0543 10/19/18 0646  BP: 121/63 (!) 133/25 (!) 135/30   Pulse: 80 91    Resp: (!) 24 (!) 25    Temp: 97.6 F (36.4 C) 97.6 F (36.4 C)    TempSrc: Oral Axillary    SpO2: 100% 100%    Weight:    42.6 kg  Height:       Weight change: -6.804 kg  Intake/Output Summary (Last 24 hours) at 10/19/2018 1105 Last data filed at 10/19/2018 0845 Gross per 24 hour  Intake 1655.5 ml  Output 110 ml  Net 1545.5 ml   Physical Exam: General: soft spoken- - frail  Heart: RRR Lungs: mostly clear Abdomen: soft, non tender Extremities: min edema- tender to touch- bilat AKA Dialysis Access: right sided TDC     Dialysis Orders:  [facility] TTS  3.5h 61kg 400/800 Hep 1800 R TDC Mircera 225 q2weeks (last 09/14/18)  Summary Pt is a 73 y.o. yo female ESRD who was admitted on 09/24/2018 with FTT after AKA culminating with PNA and respiratory failure req vent   Assessment: Pulm/ID - PNA and resp failure-  - extubated- sat 100%- WBC slow decline.  Now also C diff- on PO vanc  Recent L AKA   ESRD HD TTS  Anemia ckd- req transfusion this hosp- on max darbe- no iron right now  MBD ckd - phoslyra ordered, phos now within goal- even low, will hold phoslyra - no recent PTH data here  HTN/volume- BP stable, no bp meds. Well under dry wt here (48/61kg)  FTT- low albumin/decub that is large - has had palliative discussion but family requesting continued max efforts, cont with these discussions. FYI we do not think renal mass is of clinical significance- would not chase down.  Appreciate case management assist- yes she does need to be able to sit up for OP HD which she is not strong enough to do right now - possible LTACH candidate ?   Plan: 1. HD today upstairs, no fluid off.    Kelly Splinter MD [employer] pgr [phone]   10/19/2018, 11:05  AM        Labs: Basic Metabolic Panel: Recent Labs  Lab 10/17/18 0228 10/18/18 0244 10/19/18 0330  NA 136 137 133*  K 2.8* 3.9 4.0  CL 100 102 101  CO2 24 22 18*  GLUCOSE 157* 108* 111*  BUN 14 37* 58*  CREATININE 1.35* 2.52* 3.43*  CALCIUM 8.1* 8.5* 8.9  PHOS 2.2* 3.7 4.8*   Liver Function Tests: Recent Labs  Lab 10/17/18 0228 10/18/18 0244 10/19/18 0330  ALBUMIN 2.1* 1.9* 1.9*   No results for input(s): LIPASE, AMYLASE in the last 168 hours. No results for input(s): AMMONIA in the last 168 hours. CBC: Recent Labs  Lab 10/15/18 1124 10/16/18 0325 10/17/18 0228 10/18/18 0244 10/19/18 0330  WBC 17.7* 16.7* 14.1* 14.1* 12.0*  HGB 9.5* 9.5* 9.3* 9.7* 9.7*  HCT 31.9* 31.3* 31.7* 33.6* 33.0*  MCV 94.7 93.7 92.7 95.2 95.1  PLT 230 208 172 193 186   Cardiac Enzymes: No results for input(s): CKTOTAL, CKMB, CKMBINDEX, TROPONINI in the last 168 hours. CBG: Recent Labs  Lab 10/18/18 1654 10/18/18 1936 10/19/18 0015 10/19/18 0329 10/19/18 0803  GLUCAP 211* 193* 149* 109* 179*    Iron Studies:  No results for input(s): IRON, TIBC, TRANSFERRIN, FERRITIN in the last 72 hours. Studies/Results:  Dg Swallowing Func-speech Pathology  Result Date: 10/19/2018 Objective Swallowing Evaluation: Type of Study: MBS-Modified Barium Swallow Study  Patient Details Name: [name] MRN: [id number] Date of Birth: 1945/10/14 Today's Date: 10/19/2018 Time: SLP Start Time (ACUTE ONLY): 0930 -SLP Stop Time (ACUTE ONLY): 1005 SLP Time Calculation (min) (ACUTE ONLY): 35 min Past Medical History: Past Medical History: Diagnosis Date . Anemia  . Arthritis  . Asthma  . Atrial fibrillation ([place])  . Atrial flutter ([place])   a. s/p TEE/DCCV December 2013 b. 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 Bone spur of toe    Right 5th toe . Chronic diastolic CHF (congestive heart failure) (HCC)   a. EF 50-55% by echo in 2015 . Colon polyposis  . COPD (chronic obstructive pulmonary disease) ([place])  . DVT of upper extremity (deep vein thrombosis) ([place])   Right basilic vein, June 9211 . ESRD (end stage renal disease) on dialysis ([place])   "TTF; [place]; off [employer]." (09/07/2018) . Essential hypertension, benign  . Fractures  . Glaucoma  . Lupus ([place])  . Pericarditis  . SLE (systemic lupus erythematosus) ([place])  . Type 2 diabetes mellitus ([place])  . UTI (lower urinary tract infection)  . Wound of right leg 06/2017 Past Surgical History: Past Surgical History: Procedure Laterality Date . ABDOMINAL AORTOGRAM W/LOWER EXTREMITY N/A 12/18/2017  Procedure: ABDOMINAL AORTOGRAM W/LOWER EXTREMITY;  Surgeon: Elam Dutch, MD;  Location: [facility];  Service: Cardiovascular;  Laterality: N/A;  bilateral . ABDOMINAL HYSTERECTOMY  1983 . AMPUTATION Right 06/08/2018  Procedure: RIGHT ABOVE KNEE AMPUTATION;  Surgeon: Elam Dutch, MD;  Location: [facility] OR;  Service: Vascular;  Laterality: Right; . AMPUTATION Left 09/26/2018  Procedure: AMPUTATION ABOVE KNEE;  Surgeon: Rosetta Posner, MD;  Location: [facility];  Service: Vascular;  Laterality: Left;  WILL BE AN INPATIENT . ANKLE SURGERY  1993 . AV FISTULA PLACEMENT Left 03/27/2014  Procedure: ARTERIOVENOUS (AV) FISTULA CREATION;  Surgeon: Rosetta Posner, MD;  Location: [facility];  Service: Vascular;  Laterality: Left; . BACK SURGERY  1980 . BASCILIC VEIN TRANSPOSITION Left 01/13/2018  Procedure: LEFT BASILIC VEIN TRANSPOSITION SECOND STAGE;  Surgeon: Elam Dutch, MD;  Location: [facility];  Service: Vascular;  Laterality: Left; . CARDIOVASCULAR STRESS TEST  12/19/2009  no stress induced rhythm abnormalities, ekg negative for ischemia . CARDIOVERSION  09/16/2012  Procedure: CARDIOVERSION;  Surgeon: Sanda Klein, MD;  Location: [facility];  Service: Cardiovascular;  Laterality: N/A; . CATARACT EXTRACTION    . COLONOSCOPY  09/20/2012  Dr. [name]: multiple tubular adenomas  . COLONOSCOPY  2005  Dr. [name]. Polyps, path unknown  . COLONOSCOPY N/A 07/24/2016  Dr. [name]: 3 significant size polyps removed from the colon, tubular adenomas.  Next colonoscopy in October 2020. Marland Kitchen ESOPHAGOGASTRODUODENOSCOPY  09/20/2012  Dr. [name]: duodenal erosion and possible resolving ulcer at the angularis  . ESOPHAGOGASTRODUODENOSCOPY N/A 03/24/2018  Dr. [name]: Mild erosive reflux esophagitis, erosive gastropathy and enteropathy fairly extensive, no H. pylori on biopsy. Marland Kitchen EYE SURGERY   . IR FLUORO GUIDE CV LINE RIGHT  11/09/2017 . IR RADIOLOGIST EVAL & MGMT  09/01/2018 . IR US GUIDE VASC ACCESS RIGHT  11/09/2017 . REVISON OF ARTERIOVENOUS FISTULA Left 03/15/2018  Procedure: REVISION OF Left arm BASILIC VEIN TRANSPOSITION;  Surgeon: Angelia Mould, MD;  Location: [facility];  Service: Vascular;  Laterality: Left; .  TEE WITHOUT CARDIOVERSION  09/16/2012  Procedure: TRANSESOPHAGEAL ECHOCARDIOGRAM (TEE);  Surgeon: Sanda Klein, MD;  Location: [facility] ENDOSCOPY;  Service: Cardiovascular;  Laterality: N/A; . TRANSESOPHAGEAL ECHOCARDIOGRAM WITH CARDIOVERSION  09/16/2012  EF 60-65%, moderate LVH, moderate regurg of the aortic valve, LA moderately dilated . TRANSMETATARSAL AMPUTATION Left 09/06/2018  Procedure: TRANSMETATARSAL AMPUTATION;  Surgeon: Angelia Mould, MD;  Location: [facility] OR;  Service: Vascular;  Laterality: Left; HPI: Pt is a 73 y.o. female admitted with progressive gangrene of her left foot and is status post AKA on 09/26/2018. She has demonstrated poor p.o. intake, oral thush, as well as " significant difficulty with secretion clearance, evolving poor cough and increased work of breathing in addition to malnutrition". She developed encephalopathy, was unable to clear airway secretions and required intubation on 1/3 with extubation on 1/13. Nursing reported that NG tube placement was attempted four times without success.  Pt now with  Cortrak. Voice largely aphonic.  Subjective: Pt awake and alert, pleasant and cooperative. No family present. Pt reports she feels her voice quality is slightly improved. Still largely aphonic. Assessment / Plan / Recommendation CHL IP CLINICAL IMPRESSIONS 10/19/2018 Clinical Impression Patient has been NPO at bedside with cortrac present for nutrition and ice chips with nursing. 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  Patient is also on a specialty bed for a large decubitus ulcer impacting her ability to sit at 90 degrees for meals. Patient has a mild to moderate oropharyngeal dysphagia likely impacted by long intubation as well as deconditioned state after long hospitalization. She required 2 swallows to clear thin liquid bolus, but only one with thickened liquids. Delayed swallow initiation at the pyriform sinuses with thin liquids and the valleculae for all other consistencies. Reduced laryngeal elevation and anterior movement resulting in poor laryngeal closure and airway protection across all consistencies. Small amounts of consistent penetration of thin and nectar thickened liquids with no triggered cough response and eventually aspirated below the cords. Patient had a delayed cough in response to thin liquid passing below the cords but unable to fully clear. Mild pharyngeal residue impacted by NG tube. Patient has a fair to good prognosis for swallow to improve as her over all medical status improves, NG tube is removed and her ability to sit for longer periods improves. Recommend a Dys 2, Honey thickened diet with medications given whole in puree. Speech therapy to follow up for diet tolerance, aspiration precaution training, pharyngeal exercise and diet upgrade.  SLP Visit Diagnosis Dysphagia, oropharyngeal phase (R13.12) Attention and concentration deficit following -- Frontal lobe and executive function deficit following -- Impact on  safety and function Moderate aspiration risk   CHL IP TREATMENT RECOMMENDATION 10/19/2018 Treatment Recommendations Therapy as outlined in treatment plan below   Prognosis 10/19/2018 Prognosis for Safe Diet Advancement Good Barriers to Reach Goals Time post onset;Severity of deficits Barriers/Prognosis Comment -- CHL IP DIET RECOMMENDATION 10/19/2018 SLP Diet Recommendations Dysphagia 2 (Fine chop) solids;Honey thick liquids;Ice chips PRN after oral care Liquid Administration via Cup Medication Administration Whole meds with puree Compensations Slow rate;Small sips/bites Postural Changes Seated upright at 90 degrees   CHL IP OTHER RECOMMENDATIONS 10/19/2018 Recommended Consults -- Oral Care Recommendations Oral care QID Other Recommendations Order thickener from pharmacy   CHL IP FOLLOW UP RECOMMENDATIONS 10/19/2018 Follow up Recommendations Skilled Nursing facility   [facility] IP FREQUENCY AND DURATION 10/12/2018 Speech Therapy Frequency (ACUTE ONLY) min 2x/week Treatment Duration 2 weeks  CHL IP ORAL PHASE 10/19/2018 Oral Phase Impaired Oral - Pudding Teaspoon -- Oral - Pudding Cup -- Oral - Honey Teaspoon -- Oral - Honey Cup -- Oral - Nectar Teaspoon -- Oral - Nectar Cup -- Oral - Nectar Straw -- Oral - Thin Teaspoon -- Oral - Thin Cup Piecemeal swallowing;Weak lingual manipulation Oral - Thin Straw -- Oral - Puree -- Oral - Mech Soft Weak lingual manipulation;Impaired mastication Oral - Regular -- Oral - Multi-Consistency -- Oral - Pill -- Oral Phase - Comment --  CHL IP PHARYNGEAL PHASE 10/19/2018 Pharyngeal Phase Impaired Pharyngeal- Pudding Teaspoon -- Pharyngeal -- Pharyngeal- Pudding Cup -- Pharyngeal -- Pharyngeal- Honey Teaspoon Delayed swallow initiation-vallecula;Reduced pharyngeal peristalsis;Reduced anterior laryngeal mobility;Reduced laryngeal elevation;Reduced airway/laryngeal closure Pharyngeal Material does not enter airway Pharyngeal- Honey Cup -- Pharyngeal -- Pharyngeal- Nectar Teaspoon NT Pharyngeal --  Pharyngeal- Nectar Cup Delayed swallow initiation-vallecula;Reduced pharyngeal peristalsis;Reduced epiglottic inversion;Reduced anterior laryngeal mobility;Reduced laryngeal elevation;Reduced airway/laryngeal closure;Penetration/Aspiration during swallow;Trace aspiration;Pharyngeal residue - valleculae Pharyngeal Material enters airway, passes BELOW cords and not ejected out despite cough attempt by patient Pharyngeal- Nectar Straw -- Pharyngeal -- Pharyngeal- Thin Teaspoon -- Pharyngeal -- Pharyngeal- Thin Cup Delayed swallow initiation-pyriform sinuses;Reduced pharyngeal peristalsis;Reduced epiglottic inversion;Reduced anterior laryngeal mobility;Reduced laryngeal elevation;Reduced airway/laryngeal closure;Penetration/Aspiration during swallow;Trace aspiration;Pharyngeal residue - valleculae Pharyngeal Material enters airway, passes BELOW cords and not ejected out despite cough attempt by patient Pharyngeal- Thin Straw -- Pharyngeal -- Pharyngeal- Puree Delayed swallow initiation-vallecula;Reduced pharyngeal peristalsis;Reduced epiglottic inversion;Reduced anterior laryngeal mobility;Reduced laryngeal elevation;Reduced airway/laryngeal closure Pharyngeal -- Pharyngeal- Mechanical Soft Pharyngeal residue - valleculae;Delayed swallow initiation-vallecula Pharyngeal -- Pharyngeal- Regular -- Pharyngeal -- Pharyngeal- Multi-consistency -- Pharyngeal -- Pharyngeal- Pill -- Pharyngeal -- Pharyngeal Comment --  CHL IP CERVICAL ESOPHAGEAL PHASE 10/19/2018 Cervical Esophageal Phase WFL Pudding Teaspoon -- Pudding Cup -- Honey Teaspoon -- Honey Cup -- Nectar Teaspoon -- Nectar Cup -- Nectar Straw -- Thin Teaspoon -- Thin Cup -- Thin Straw -- Puree -- Mechanical Soft -- Regular -- Multi-consistency -- Pill -- Cervical Esophageal Comment -- [name] Ward 10/19/2018, 10:56 AM              Medications: Infusions: . sodium chloride Stopped (10/11/18 0902)  . feeding supplement (VITAL AF 1.2 CAL) 1,000 mL (10/18/18 1654)     Scheduled Medications: . acetaminophen (TYLENOL) oral liquid 160 mg/5 mL  650 mg Per Tube Q6H  . amiodarone  200 mg Per Tube Daily  . apixaban  5 mg Oral BID  . brimonidine  1 drop Left Eye BID   And  . timolol  1 drop Left Eye BID  . Chlorhexidine Gluconate Cloth  6 each Topical Q0600  . Chlorhexidine Gluconate Cloth  6 each Topical Q0600  . darbepoetin (ARANESP) injection - DIALYSIS  200 mcg Intravenous Q Sat-HD  . feeding supplement (PRO-STAT SUGAR FREE 64)  60 mL Per Tube BID  . insulin aspart  0-9 Units Subcutaneous Q4H  . latanoprost  1 drop Both Eyes QHS  . pantoprazole  40 mg Oral BID  . predniSONE  10 mg Per Tube Q breakfast  . sodium chloride flush  3 mL Intravenous Q12H  . sodium hypochlorite   Irrigation q morning - 10a  . sucralfate  1 g Oral TID WC & HS  . vancomycin  125 mg Oral Q6H    have reviewed scheduled and prn medications.

## 2018-10-19 NOTE — Progress Notes (Addendum)
[facility]  Subjective  - Comfortable without complaints of discomfort in the left stump.   Objective (!) 135/30 91 97.6 F (36.4 C) (Axillary) (!) 25 100%  Intake/Output Summary (Last 24 hours) at 10/19/2018 0715 Last data filed at 10/19/2018 4163 Gross per 24 hour  Intake 755.5 ml  Output 110 ml  Net 645.5 ml    Left stump with narcotic changes to the lateral stump Medial stump healing well      Assessment/Planning: Left above knee amputation with necrotic lateral stump. Afebrile with elevated WBC of 12.0 down from 14.1 She may need revision of the stump pending further changes secondary to necrosis.   She may not tolerate a revision due to her recent medical events. Dry dressing as needed daily will continue to monitor. Her hospital stay has been complicated by septic shock and acute hypoxic respiratory failure due to ESBL E. coli pneumonia, C. difficile colitis, acute on chronic anemia, land a large decubitus ulcer.  [name] 10/19/2018 7:15 AM --  Laboratory Lab Results: Recent Labs    10/18/18 0244 10/19/18 0330  WBC 14.1* 12.0*  HGB 9.7* 9.7*  HCT 33.6* 33.0*  PLT 193 186   BMET Recent Labs    10/18/18 0244 10/19/18 0330  NA 137 133*  K 3.9 4.0  CL 102 101  CO2 22 18*  GLUCOSE 108* 111*  BUN 37* 58*  CREATININE 2.52* 3.43*  CALCIUM 8.5* 8.9    COAG Lab Results  Component Value Date   INR 1.45 10/01/2018   INR 1.17 09/26/2018   INR 1.18 06/08/2018   No results found for: PTT  I have examined the patient, reviewed and agree with above.  Appears to have some continued superficial breakdown of her lateral skin edge.  Very poor nutritional status.  We will continue to follow as an inpatient and outpatient post discharge.  Curt Jews, MD 10/19/2018 8:17 AM

## 2018-10-19 NOTE — Progress Notes (Signed)
Patient returned from dialysis, VSS, dressing changed on patient's left knee and sacrum. Linen and gown changed. Patient turned to reduce pressure related pain. Will continue to monitor.

## 2018-10-19 NOTE — Progress Notes (Signed)
[facility] for Heparin (Apixaban on hold) Indication: atrial fibrillation / DVT  Allergies  Allergen Reactions  . Ace Inhibitors Cough  . Fentanyl     Fentanyl PATCH causes lethargy, somnolence, and AMS. Tolerated IV Fentanyl in small increments.    Patient Measurements: TBW 56 kg Heparin Dosing Weight: 57.6 kg  Vital Signs: Temp: 97.6 F (36.4 C) (01/21 0538) Temp Source: Axillary (01/21 0538) BP: 135/30 (01/21 0543) Pulse Rate: 91 (01/21 0538)  Labs: Recent Labs    10/17/18 0228 10/17/18 1145 10/18/18 0244 10/19/18 0330  HGB 9.3*  --  9.7* 9.7*  HCT 31.7*  --  33.6* 33.0*  PLT 172  --  193 186  HEPARINUNFRC 0.23* 0.28* 0.36 0.43  CREATININE 1.35*  --  2.52* 3.43*    Estimated Creatinine Clearance: 10 mL/min (A) (by C-G formula based on SCr of 3.43 mg/dL (H)).  Assessment: 73 yo F presents with wound infection. On Eliquis PTA for DVT and Afib- last dose was 1/4 at 1000. Eliquis held due to suspicion for GIB.  Pharmacy asked to restart anticoagulation with IV heparin.  Heparin level therapeutic at 0.46. Hemoglobin low/stable at 9.7. Pltc wnl. Some oozing noted at the stump site per RN overnight. RN confirmed this morning that this was minor oozing at the incision site and it has not worsened.   Goal of Therapy:  Heparin level 0.3-0.7 units/ml Monitor platelets by anticoagulation protocol: Yes   Plan:  -Continue heparin at 1050 units/hr -Heparin level and CBC daily -Monitor for signs/symptoms of bleeding -Follow up restarting apixaban when able  Vertis Kelch, PharmD PGY1 Pharmacy Resident Phone [phone] 10/19/2018       6:57 AM

## 2018-10-19 NOTE — Progress Notes (Signed)
PT Cancellation Note  Patient Details Name: [name] MRN: [id number] DOB: 05/10/1946   Cancelled Treatment:    Reason Eval/Treat Not Completed: Patient at procedure or test/unavailable   Duncan Dull 10/19/2018, 12:33 PM

## 2018-10-20 DIAGNOSIS — I4819 Other persistent atrial fibrillation: Secondary | ICD-10-CM

## 2018-10-20 LAB — RENAL FUNCTION PANEL
Albumin: 1.7 g/dL — ABNORMAL LOW (ref 3.5–5.0)
Anion gap: 11 (ref 5–15)
BUN: 22 mg/dL (ref 8–23)
CALCIUM: 8.3 mg/dL — AB (ref 8.9–10.3)
CO2: 23 mmol/L (ref 22–32)
Chloride: 102 mmol/L (ref 98–111)
Creatinine, Ser: 1.84 mg/dL — ABNORMAL HIGH (ref 0.44–1.00)
GFR calc Af Amer: 31 mL/min — ABNORMAL LOW (ref >60)
GFR calc non Af Amer: 27 mL/min — ABNORMAL LOW (ref >60)
Glucose, Bld: 169 mg/dL — ABNORMAL HIGH (ref 70–99)
Phosphorus: 2.2 mg/dL — ABNORMAL LOW (ref 2.5–4.6)
Potassium: 3.7 mmol/L (ref 3.5–5.1)
SODIUM: 136 mmol/L (ref 135–145)

## 2018-10-20 LAB — GLUCOSE, CAPILLARY
Glucose-Capillary: 103 mg/dL — ABNORMAL HIGH (ref 70–99)
Glucose-Capillary: 116 mg/dL — ABNORMAL HIGH (ref 70–99)
Glucose-Capillary: 152 mg/dL — ABNORMAL HIGH (ref 70–99)
Glucose-Capillary: 203 mg/dL — ABNORMAL HIGH (ref 70–99)
Glucose-Capillary: 206 mg/dL — ABNORMAL HIGH (ref 70–99)
Glucose-Capillary: 244 mg/dL — ABNORMAL HIGH (ref 70–99)
Glucose-Capillary: 251 mg/dL — ABNORMAL HIGH (ref 70–99)

## 2018-10-20 LAB — CBC
HCT: 31.7 % — ABNORMAL LOW (ref 36.0–46.0)
Hemoglobin: 9 g/dL — ABNORMAL LOW (ref 12.0–15.0)
MCH: 27.4 pg (ref 26.0–34.0)
MCHC: 28.4 g/dL — ABNORMAL LOW (ref 30.0–36.0)
MCV: 96.6 fL (ref 80.0–100.0)
Platelets: 159 10*3/uL (ref 150–400)
RBC: 3.28 MIL/uL — ABNORMAL LOW (ref 3.87–5.11)
RDW: 22.7 % — AB (ref 11.5–15.5)
WBC: 10.6 10*3/uL — ABNORMAL HIGH (ref 4.0–10.5)
nRBC: 0 % (ref 0.0–0.2)

## 2018-10-20 LAB — HEPATITIS B SURFACE ANTIGEN: Hepatitis B Surface Ag: NEGATIVE

## 2018-10-20 MED ORDER — APIXABAN 2.5 MG PO TABS
2.5000 mg | ORAL_TABLET | Freq: Two times a day (BID) | ORAL | Status: DC
  Administered 2018-10-20 – 2018-10-23 (×6): 2.5 mg via ORAL
  Filled 2018-10-20 (×6): qty 1

## 2018-10-20 MED ORDER — PREDNISONE 10 MG PO TABS
10.0000 mg | ORAL_TABLET | Freq: Every day | ORAL | Status: DC
  Administered 2018-10-20 – 2018-10-22 (×3): 10 mg via ORAL
  Filled 2018-10-20 (×3): qty 1

## 2018-10-20 MED ORDER — ACETAMINOPHEN 160 MG/5ML PO SOLN: 650.0000 mg | Freq: Four times a day (QID) | ORAL | Status: DC

## 2018-10-20 MED ORDER — CHLORHEXIDINE GLUCONATE CLOTH 2 % EX PADS: 6.0000 | MEDICATED_PAD | Freq: Every day | CUTANEOUS | Status: DC

## 2018-10-20 MED ORDER — ACETAMINOPHEN 325 MG PO TABS
650.0000 mg | ORAL_TABLET | Freq: Four times a day (QID) | ORAL | Status: DC
  Administered 2018-10-20 – 2018-10-23 (×13): 650 mg via ORAL
  Filled 2018-10-20 (×13): qty 2

## 2018-10-20 NOTE — Progress Notes (Signed)
   10/16/18 1300  Clinical Encounter Type  Visited With Patient and family together  Visit Type Initial  Completed AD for patient and gave original copy to patient's husband

## 2018-10-20 NOTE — Progress Notes (Signed)
Physical Therapy Treatment Patient Details Name: [name] MRN: [id number] DOB: 08-31-1946 Today's Date: 10/20/2018    History of Present Illness ANNSLEY AKKERMAN is a 73 y.o. female with medical history significant of hypertension, hyperlipidemia, diabetes mellitus, COPD, GERD, gout, and anemia, atrial fibrillation and DVT on Eliquis, ESRD-HD (TTS), lupus, PAD, R AKA (05/2018), now s/p L AKA. Pt with AMS and lethargy 10/01/18 transferred to ICU and intubated. Pt also with unstageable sacral injury    PT Comments    Pt with slow progress towards functional mobility goals. Session focused on static seated balance with upper extremity support and varying levels of assistance. Pt able to tolerate sitting for 15 minutes on edge of bed before requesting to lay back down due to sacral pain. Ordered bilateral amputee maximove sling to progress out of bed mobility.     Follow Up Recommendations  LTACH     Equipment Recommendations  Other (comment)(defer)    Recommendations for Other Services       Precautions / Restrictions Precautions Precautions: Fall Precaution Comments:  flexiseal, extensive wound on buttocks Restrictions Weight Bearing Restrictions: Yes RLE Weight Bearing: Non weight bearing LLE Weight Bearing: Non weight bearing    Mobility  Bed Mobility Overal bed mobility: Needs Assistance Bed Mobility: Rolling;Supine to Sit;Sit to Supine Rolling: Mod assist   Supine to sit: +2 for physical assistance;Max assist Sit to supine: +2 for physical assistance;Max assist   General bed mobility comments: cues for sequencing, assist for trunk and to position hips  Transfers                    Ambulation/Gait                 Stairs             Wheelchair Mobility    Modified Rankin (Stroke Patients Only)       Balance Overall balance assessment: Needs assistance Sitting-balance support: Bilateral upper extremity supported Sitting  balance-Leahy Scale: Poor Sitting balance - Comments: with single and B UE support, x 15 minutes with min guard to max assist, worked on leaning to side and recovering to midline Postural control: Posterior lean                                  Cognition Arousal/Alertness: Awake/alert Behavior During Therapy: WFL for tasks assessed/performed Overall Cognitive Status: Impaired/Different from baseline Area of Impairment: Following commands;Memory;Attention;Problem solving                     Memory: Decreased short-term memory Following Commands: Follows one step commands with increased time     Problem Solving: Slow processing;Difficulty sequencing;Requires verbal cues        Exercises      General Comments        Pertinent Vitals/Pain Pain Assessment: Faces Faces Pain Scale: Hurts whole lot Pain Location: buttocks Pain Descriptors / Indicators: Grimacing;Moaning Pain Intervention(s): Monitored during session    Home Living                      Prior Function            PT Goals (current goals can now be found in the care plan section) Acute Rehab PT Goals Patient Stated Goal: "to sit on the bed." PT Goal Formulation: With patient Time For Goal Achievement: 11/03/18 Potential to Achieve Goals:  Fair Progress towards PT goals: Progressing toward goals    Frequency    Min 2X/week      PT Plan Current plan remains appropriate    Co-evaluation PT/OT/SLP Co-Evaluation/Treatment: Yes Reason for Co-Treatment: For patient/therapist safety;To address functional/ADL transfers   OT goals addressed during session: ADL's and self-care;Strengthening/ROM      AM-PAC PT "6 Clicks" Mobility   Outcome Measure  Help needed turning from your back to your side while in a flat bed without using bedrails?: Total Help needed moving from lying on your back to sitting on the side of a flat bed without using bedrails?: Total Help needed moving  to and from a bed to a chair (including a wheelchair)?: Total Help needed standing up from a chair using your arms (e.g., wheelchair or bedside chair)?: Total Help needed to walk in hospital room?: Total Help needed climbing 3-5 steps with a railing? : Total 6 Click Score: 6    End of Session   Activity Tolerance: Patient limited by pain Patient left: in bed;with call bell/phone within reach;with family/visitor present Nurse Communication: Mobility status;Need for lift equipment PT Visit Diagnosis: Other abnormalities of gait and mobility (R26.89);Muscle weakness (generalized) (M62.81);Difficulty in walking, not elsewhere classified (R26.2);Pain Pain - Right/Left: Left Pain - part of body: Leg     Time: 1855-0158 PT Time Calculation (min) (ACUTE ONLY): 29 min  Charges:  $Neuromuscular Re-education: 8-22 mins                    Ellamae Sia, PT, DPT Acute Rehabilitation Services Pager [phone] Office [phone]    Willy Eddy 10/20/2018, 3:56 PM

## 2018-10-20 NOTE — Progress Notes (Signed)
Physical Therapy Wound Treatment Patient Details  Name: [name] MRN: [id number] Date of Birth: 1945/11/17  Today's Date: 10/20/2018 Time: 0825-0911 Time Calculation (min): 46 min  Subjective  Subjective: "Are you going to hurt my butt again?" Date of Onset: (unknown) Prior Treatments: dressing change  Pain Score:  Premedicated. Pt verbalizing sacral pain with positioning and during hydrotherapy/selective debridement.  Wound Assessment  Pressure Injury 09/27/18 Unstageable - Full thickness tissue loss in which the base of the ulcer is covered by slough (yellow, tan, gray, green or brown) and/or eschar (tan, brown or black) in the wound bed. (Active)  Dressing Type ABD;Barrier Film (skin prep);Tape dressing;Other (Comment) 10/20/2018 10:21 AM  Dressing Intact 10/20/2018 10:21 AM  Dressing Change Frequency Daily 10/20/2018 10:21 AM  State of Healing Eschar 10/20/2018 10:21 AM  Site / Wound Assessment Black;Red;Yellow 10/20/2018 10:21 AM  % Wound base Red or Granulating 5% 10/20/2018 10:21 AM  % Wound base Yellow/Fibrinous Exudate 80% 10/20/2018 10:21 AM  % Wound base Black/Eschar 15% 10/20/2018 10:21 AM  % Wound base Other/Granulation Tissue (Comment) 0% 10/20/2018 10:21 AM  Peri-wound Assessment Erythema (blanchable) 10/20/2018 10:21 AM  Wound Length (cm) 9.5 cm 10/14/2018 11:00 AM  Wound Width (cm) 9.3 cm 10/14/2018 11:00 AM  Wound Depth (cm) 0.1 cm 10/14/2018 11:00 AM  Wound Surface Area (cm^2) 88.35 cm^2 10/14/2018 11:00 AM  Wound Volume (cm^3) 8.84 cm^3 10/14/2018 11:00 AM  Tunneling (cm) 0 10/06/2018  8:00 PM  Undermining (cm) 0 10/06/2018  8:00 PM  Margins Unattached edges (unapproximated) 10/20/2018 10:21 AM  Drainage Amount Minimal 10/20/2018 10:21 AM  Drainage Description Green;Odor;Sanguineous 10/20/2018 10:21 AM  Treatment Debridement (Selective);Hydrotherapy (Pulse lavage);Tape changed;Other (Comment) 10/20/2018 10:21 AM      Hydrotherapy Pulsed lavage therapy - wound location:  sacrum Pulsed Lavage with Suction (psi): 12 psi(8-12) Pulsed Lavage with Suction - Normal Saline Used: 1000 mL Pulsed Lavage Tip: Tip with splash shield Selective Debridement Selective Debridement - Location: sacrum Selective Debridement - Tools Used: Forceps;Scalpel Selective Debridement - Tissue Removed: yellow and black necrotic tissue   Wound Assessment and Plan  Wound Therapy - Assess/Plan/Recommendations Wound Therapy - Clinical Statement: Continue with removal of necrotic tissue of sacral wound. Continues to have moderate green/blue drainage with associated odor from wound. RN WOC present and recommended transition to Aquacel Ag. Frequency updated to 3X/week. Wound Therapy - Functional Problem List: decreased mobility, decreased sitting balance Factors Delaying/Impairing Wound Healing: Diabetes Mellitus;Immobility;Multiple medical problems Hydrotherapy Plan: Debridement;Dressing change;Patient/family education;Pulsatile lavage with suction Wound Therapy - Frequency: 3X / week(MWF) Wound Therapy - Follow Up Recommendations: Other (comment)(LTACH) Wound Plan: see above  Wound Therapy Goals- Improve the function of patient's integumentary system by progressing the wound(s) through the phases of wound healing (inflammation - proliferation - remodeling) by: Decrease Necrotic Tissue to: 80 Decrease Necrotic Tissue - Progress: Progressing toward goal Increase Granulation Tissue to: 20 Increase Granulation Tissue - Progress: Progressing toward goal Goals/treatment plan/discharge plan were made with and agreed upon by patient/family: Yes Time For Goal Achievement: 7 days Wound Therapy - Potential for Goals: Fair  Goals will be updated until maximal potential achieved or discharge criteria met.  Discharge criteria: when goals achieved, discharge from hospital, MD decision/surgical intervention, no progress towards goals, refusal/missing three consecutive treatments without notification or  medical reason.  GP     Ellamae Sia, PT, DPT Acute Rehabilitation Services Pager [phone] Office [phone]  Willy Eddy 10/20/2018, 10:28 AM

## 2018-10-20 NOTE — Progress Notes (Signed)
Subjective: getting hydroRx again  Objective Vital signs in last 24 hours: Vitals:   10/19/18 1551 10/19/18 1659 10/19/18 2029 10/20/18 0401  BP: (!) 165/49 (!) 145/53 (!) 122/33 (!) 122/21  Pulse: 83 87 96 88  Resp: 16 (!) 23 15 20   Temp: 98.2 F (36.8 C)  97.6 F (36.4 C) 98.6 F (37 C)  TempSrc: Oral  Oral Oral  SpO2: 100% 100% 100% 100%  Weight: 54.5 kg   52.6 kg  Height:       Weight change: 11.8 kg  Intake/Output Summary (Last 24 hours) at 10/20/2018 1129 Last data filed at 10/20/2018 0539 Gross per 24 hour  Intake 1512.57 ml  Output 560 ml  Net 952.57 ml   Physical Exam: General: soft spoken- - frail  Heart: RRR Lungs: mostly clear Abdomen: soft, non tender, large post sacral decub Extremities: min edema- tender to touch- bilat AKA Dialysis Access: right sided TDC     Dialysis Orders:  [facility] TTS  3.5h 61kg 400/800 Hep 1800 R TDC Mircera 225 q2weeks (last 09/14/18)  Summary Pt is a 73 y.o. yo female ESRD who was admitted on 09/24/2018 with FTT after AKA culminating with PNA and respiratory failure req vent   Assessment: 1. Pulm - PNA and resp failure, resolved   2. C diff- on PO vanc 3. Sacral decub - large and deep. Getting hydroRx 4. Recent L AKA  5. ESRD HD TTS - HD tomorrow 6. Anemia ckd- req transfusion this hosp- on max darbe- no iron right now 7. MBD ckd - phos low, will hold phoslyra - no recent PTH data here 8. HTN/volume- BP stable, no bp meds. Well under dry wt here (52/61kg) 9. FTT- low albumin/decub that is large - has had palliative discussion but family requesting continued max efforts, cont with these discussions 10. Renal mass- not of clinical significance- would not chase down. 11. Dispo - appreciate case management assist- she does need to be able to sit up for OP HD which she is not strong enough to do right now - possible LTACH candidate ?   Plan: 1. HD tomorrow    Kelly Splinter MD [facility] pgr [phone]    10/20/2018, 11:29 AM        Labs: Basic Metabolic Panel: Recent Labs  Lab 10/18/18 0244 10/19/18 0330 10/20/18 0306  NA 137 133* 136  K 3.9 4.0 3.7  CL 102 101 102  CO2 22 18* 23  GLUCOSE 108* 111* 169*  BUN 37* 58* 22  CREATININE 2.52* 3.43* 1.84*  CALCIUM 8.5* 8.9 8.3*  PHOS 3.7 4.8* 2.2*   Liver Function Tests: Recent Labs  Lab 10/18/18 0244 10/19/18 0330 10/20/18 0306  ALBUMIN 1.9* 1.9* 1.7*   No results for input(s): LIPASE, AMYLASE in the last 168 hours. No results for input(s): AMMONIA in the last 168 hours. CBC: Recent Labs  Lab 10/16/18 0325 10/17/18 0228 10/18/18 0244 10/19/18 0330 10/20/18 0306  WBC 16.7* 14.1* 14.1* 12.0* 10.6*  HGB 9.5* 9.3* 9.7* 9.7* 9.0*  HCT 31.3* 31.7* 33.6* 33.0* 31.7*  MCV 93.7 92.7 95.2 95.1 96.6  PLT 208 172 193 186 159   Cardiac Enzymes: No results for input(s): CKTOTAL, CKMB, CKMBINDEX, TROPONINI in the last 168 hours. CBG: Recent Labs  Lab 10/19/18 1658 10/19/18 2019 10/20/18 0030 10/20/18 0359 10/20/18 0807  GLUCAP 107* 136* 206* 152* 103*    Iron Studies:  No results for input(s): IRON, TIBC, TRANSFERRIN, FERRITIN in the last 72 hours. Studies/Results:  Dg Swallowing Func-speech Pathology  Result Date: 10/19/2018 Objective Swallowing Evaluation: Type of Study: MBS-Modified Barium Swallow Study  Patient Details Name: [name] MRN: [id number] Date of Birth: 25-Jun-1946 Today's Date: 10/19/2018 Time: SLP Start Time (ACUTE ONLY): 0930 -SLP Stop Time (ACUTE ONLY): 1005 SLP Time Calculation (min) (ACUTE ONLY): 35 min Past Medical History: Past Medical History: Diagnosis Date . Anemia  . Arthritis  . Asthma  . Atrial fibrillation ([place])  . Atrial flutter ([place])   a. s/p TEE/DCCV December 2013 b. 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 Bone  spur of toe   Right 5th toe . Chronic diastolic CHF (congestive heart failure) (HCC)   a. EF 50-55% by echo in 2015 . Colon polyposis  . COPD (chronic obstructive pulmonary disease) ([place])  . DVT of upper extremity (deep vein thrombosis) ([place])   Right basilic vein, June 3546 . ESRD (end stage renal disease) on dialysis ([place])   "TTF; [place]; off [employer]." (09/07/2018) . Essential hypertension, benign  . Fractures  . Glaucoma  . Lupus ([place])  . Pericarditis  . SLE (systemic lupus erythematosus) ([place])  . Type 2 diabetes mellitus ([place])  . UTI (lower urinary tract infection)  . Wound of right leg 06/2017 Past Surgical History: Past Surgical History: Procedure Laterality Date . ABDOMINAL AORTOGRAM W/LOWER EXTREMITY N/A 12/18/2017  Procedure: ABDOMINAL AORTOGRAM W/LOWER EXTREMITY;  Surgeon: Elam Dutch, MD;  Location: [place] [facility];  Service: Cardiovascular;  Laterality: N/A;  bilateral . ABDOMINAL HYSTERECTOMY  1983 . AMPUTATION Right 06/08/2018  Procedure: RIGHT ABOVE KNEE AMPUTATION;  Surgeon: Elam Dutch, MD;  [facility];  Service: Vascular;  Laterality: Right; . AMPUTATION Left 09/26/2018  Procedure: AMPUTATION ABOVE KNEE;  Surgeon: Rosetta Posner, MD;  Location: [facility] OR;  Service: Vascular;  Laterality: Left;  WILL BE AN INPATIENT . ANKLE SURGERY  1993 . AV FISTULA PLACEMENT Left 03/27/2014  Procedure: ARTERIOVENOUS (AV) FISTULA CREATION;  Surgeon: Rosetta Posner, MD;  Location: [place];  Service: Vascular;  Laterality: Left; . BACK SURGERY  1980 . BASCILIC VEIN TRANSPOSITION Left 01/13/2018  Procedure: LEFT BASILIC VEIN TRANSPOSITION SECOND STAGE;  Surgeon: Elam Dutch, MD;  Location: [place];  Service: Vascular;  Laterality: Left; . CARDIOVASCULAR STRESS TEST  12/19/2009  no stress induced rhythm abnormalities, ekg negative for ischemia . CARDIOVERSION  09/16/2012  Procedure: CARDIOVERSION;  Surgeon: Sanda Klein, MD;  Location: [facility] ENDOS[facility];  Service: Cardiovascular;  Laterality: N/A; .  CATARACT EXTRACTION   . COLONOSCOPY  09/20/2012  Dr. [name]: multiple tubular adenomas  . COLONOSCOPY  2005  Dr. [name]. Polyps, path unknown  . COLONOSCOPY N/A 07/24/2016  Dr. [name]: 3 significant size polyps removed from the colon, tubular adenomas.  Next colonoscopy in October 2020. Marland Kitchen ESOPHAGOGASTRODUODENOSCOPY  09/20/2012  Dr. [name]: duodenal erosion and possible resolving ulcer at the angularis  . ESOPHAGOGASTRODUODENOSCOPY N/A 03/24/2018  Dr. [name]: Mild erosive reflux esophagitis, erosive gastropathy and enteropathy fairly extensive, no H. pylori on biopsy. Marland Kitchen EYE SURGERY   . IR FLUORO GUIDE CV LINE RIGHT  11/09/2017 . IR RADIOLOGIST EVAL & MGMT  09/01/2018 . IR US GUIDE VASC ACCESS RIGHT  11/09/2017 . REVISON OF ARTERIOVENOUS FISTULA Left 03/15/2018  Procedure: REVISION OF Left arm BASILIC VEIN TRANSPOSITION;  Surgeon: Angelia Mould, MD;  Location: [place];  Service: Vascular;  Laterality: Left; .  TEE WITHOUT CARDIOVERSION  09/16/2012  Procedure: TRANSESOPHAGEAL ECHOCARDIOGRAM (TEE);  Surgeon: Sanda Klein, MD;  Location: [facility] ENDOSCOPY;  Service: Cardiovascular;  Laterality: N/A; . TRANSESOPHAGEAL ECHOCARDIOGRAM WITH CARDIOVERSION  09/16/2012  EF 60-65%, moderate LVH, moderate regurg of the aortic valve, LA moderately dilated . TRANSMETATARSAL AMPUTATION Left 09/06/2018  Procedure: TRANSMETATARSAL AMPUTATION;  Surgeon: Angelia Mould, MD;  Location[facility] OR;  Service: Vascular;  Laterality: Left; HPI: Pt is a 73 y.o. female admitted with progressive gangrene of her left foot and is status post AKA on 09/26/2018. She has demonstrated poor p.o. intake, oral thush, as well as " significant difficulty with secretion clearance, evolving poor cough and increased work of breathing in addition to malnutrition". She developed encephalopathy, was unable to clear airway secretions and required intubation on 1/3 with extubation on 1/13. Nursing reported that NG tube placement was attempted four times without  success.  Pt now with Cortrak. Voice largely aphonic.  Subjective: Pt awake and alert, pleasant and cooperative. No family present. Pt reports she feels her voice quality is slightly improved. Still largely aphonic. Assessment / Plan / Recommendation CHL IP CLINICAL IMPRESSIONS 10/19/2018 Clinical Impression Patient has been NPO at bedside with cortrac present for nutrition and ice chips with nursing. 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  Patient is also on a specialty bed for a large decubitus ulcer impacting her ability to sit at 90 degrees for meals. Patient has a mild to moderate oropharyngeal dysphagia likely impacted by long intubation as well as deconditioned state after long hospitalization. She required 2 swallows to clear thin liquid bolus, but only one with thickened liquids. Delayed swallow initiation at the pyriform sinuses with thin liquids and the valleculae for all other consistencies. Reduced laryngeal elevation and anterior movement resulting in poor laryngeal closure and airway protection across all consistencies. Small amounts of consistent penetration of thin and nectar thickened liquids with no triggered cough response and eventually aspirated below the cords. Patient had a delayed cough in response to thin liquid passing below the cords but unable to fully clear. Mild pharyngeal residue impacted by NG tube. Patient has a fair to good prognosis for swallow to improve as her over all medical status improves, NG tube is removed and her ability to sit for longer periods improves. Recommend a Dys 2, Honey thickened diet with medications given whole in puree. Speech therapy to follow up for diet tolerance, aspiration precaution training, pharyngeal exercise and diet upgrade.  SLP Visit Diagnosis Dysphagia, oropharyngeal phase (R13.12) Attention and concentration deficit following -- Frontal lobe and executive function deficit  following -- Impact on safety and function Moderate aspiration risk   CHL IP TREATMENT RECOMMENDATION 10/19/2018 Treatment Recommendations Therapy as outlined in treatment plan below   Prognosis 10/19/2018 Prognosis for Safe Diet Advancement Good Barriers to Reach Goals Time post onset;Severity of deficits Barriers/Prognosis Comment -- CHL IP DIET RECOMMENDATION 10/19/2018 SLP Diet Recommendations Dysphagia 2 (Fine chop) solids;Honey thick liquids;Ice chips PRN after oral care Liquid Administration via Cup Medication Administration Whole meds with puree Compensations Slow rate;Small sips/bites Postural Changes Seated upright at 90 degrees   CHL IP OTHER RECOMMENDATIONS 10/19/2018 Recommended Consults -- Oral Care Recommendations Oral care QID Other Recommendations Order thickener from pharmacy   CHL IP FOLLOW UP RECOMMENDATIONS 10/19/2018 Follow up Recommendations Skilled Nursing facility   [facility] IP FREQUENCY AND DURATION 10/12/2018 Speech Therapy Frequency (ACUTE ONLY) min 2x/week Treatment Duration 2 weeks  CHL IP ORAL PHASE 10/19/2018 Oral Phase Impaired Oral - Pudding Teaspoon -- Oral - Pudding Cup -- Oral - Honey Teaspoon -- Oral - Honey Cup -- Oral - Nectar Teaspoon -- Oral - Nectar Cup -- Oral - Nectar Straw -- Oral - Thin Teaspoon -- Oral - Thin Cup Piecemeal swallowing;Weak lingual manipulation Oral - Thin Straw -- Oral - Puree -- Oral - Mech Soft Weak lingual manipulation;Impaired mastication Oral - Regular -- Oral - Multi-Consistency -- Oral - Pill -- Oral Phase - Comment --  CHL IP PHARYNGEAL PHASE 10/19/2018 Pharyngeal Phase Impaired Pharyngeal- Pudding Teaspoon -- Pharyngeal -- Pharyngeal- Pudding Cup -- Pharyngeal -- Pharyngeal- Honey Teaspoon Delayed swallow initiation-vallecula;Reduced pharyngeal peristalsis;Reduced anterior laryngeal mobility;Reduced laryngeal elevation;Reduced airway/laryngeal closure Pharyngeal Material does not enter airway Pharyngeal- Honey Cup -- Pharyngeal -- Pharyngeal- Nectar  Teaspoon NT Pharyngeal -- Pharyngeal- Nectar Cup Delayed swallow initiation-vallecula;Reduced pharyngeal peristalsis;Reduced epiglottic inversion;Reduced anterior laryngeal mobility;Reduced laryngeal elevation;Reduced airway/laryngeal closure;Penetration/Aspiration during swallow;Trace aspiration;Pharyngeal residue - valleculae Pharyngeal Material enters airway, passes BELOW cords and not ejected out despite cough attempt by patient Pharyngeal- Nectar Straw -- Pharyngeal -- Pharyngeal- Thin Teaspoon -- Pharyngeal -- Pharyngeal- Thin Cup Delayed swallow initiation-pyriform sinuses;Reduced pharyngeal peristalsis;Reduced epiglottic inversion;Reduced anterior laryngeal mobility;Reduced laryngeal elevation;Reduced airway/laryngeal closure;Penetration/Aspiration during swallow;Trace aspiration;Pharyngeal residue - valleculae Pharyngeal Material enters airway, passes BELOW cords and not ejected out despite cough attempt by patient Pharyngeal- Thin Straw -- Pharyngeal -- Pharyngeal- Puree Delayed swallow initiation-vallecula;Reduced pharyngeal peristalsis;Reduced epiglottic inversion;Reduced anterior laryngeal mobility;Reduced laryngeal elevation;Reduced airway/laryngeal closure Pharyngeal -- Pharyngeal- Mechanical Soft Pharyngeal residue - valleculae;Delayed swallow initiation-vallecula Pharyngeal -- Pharyngeal- Regular -- Pharyngeal -- Pharyngeal- Multi-consistency -- Pharyngeal -- Pharyngeal- Pill -- Pharyngeal -- Pharyngeal Comment --  CHL IP CERVICAL ESOPHAGEAL PHASE 10/19/2018 Cervical Esophageal Phase WFL Pudding Teaspoon -- Pudding Cup -- Honey Teaspoon -- Honey Cup -- Nectar Teaspoon -- Nectar Cup -- Nectar Straw -- Thin Teaspoon -- Thin Cup -- Thin Straw -- Puree -- Mechanical Soft -- Regular -- Multi-consistency -- Pill -- Cervical Esophageal Comment -- [name] Ward 10/19/2018, 10:56 AM              Medications: Infusions: . sodium chloride Stopped (10/11/18 0902)    Scheduled Medications: . acetaminophen   650 mg Oral Q6H  . amiodarone  200 mg Per Tube Daily  . apixaban  2.5 mg Oral BID  . brimonidine  1 drop Left Eye BID   And  . timolol  1 drop Left Eye BID  . Chlorhexidine Gluconate Cloth  6 each Topical Q0600  . Chlorhexidine Gluconate Cloth  6 each Topical Q0600  . darbepoetin (ARANESP) injection - DIALYSIS  200 mcg Intravenous Q Sat-HD  . feeding supplement (PRO-STAT SUGAR FREE 64)  60 mL Per Tube BID  . insulin aspart  0-9 Units Subcutaneous Q4H  . latanoprost  1 drop Both Eyes QHS  . pantoprazole  40 mg Oral BID  . predniSONE  10 mg Oral Q breakfast  . sodium chloride flush  3 mL Intravenous Q12H  . sucralfate  1 g Oral TID WC & HS  . vancomycin  125 mg Oral Q6H    have reviewed scheduled and prn medications.

## 2018-10-20 NOTE — Progress Notes (Signed)
Received notice from Raquel Sarna with [facility] that they have received a denial from patient's insurance for [facility]. [facility] has sent the Expedited Appeal request for MD to sign on patient's behalf to start process for expedited appeal. MD agreeable and signed form has been sent back to [facility] to begin appeals process. CM spoke with pt and husband at bedside to update on insurance denial and plan for appeal request- Pt and husband want to proceed with appeal for LTACH, explained this process can take up to 72 hours.

## 2018-10-20 NOTE — Progress Notes (Signed)
   10/20/18 1500  Clinical Encounter Type  Visited With Patient and family together  Visit Type Follow-up  Patient husband is wanting to get financial power of attorney. I advised him to find a notary and bring them to where his wife is placed. Spoke to nurse to also relay this information to patient's husband.

## 2018-10-20 NOTE — Progress Notes (Signed)
  Speech Language Pathology Treatment: Dysphagia  Patient Details Name: [name] MRN: [id number] DOB: Apr 06, 1946 Today's Date: 10/20/2018 Time: 5697-9480 SLP Time Calculation (min) (ACUTE ONLY): 23 min  Assessment / Plan / Recommendation Clinical Impression  Pt was eager to try ice chips with one delayed throat clear noted across several pieces. Reinforced the possibility of this being a sign of aspiration given results of MBS on previous date, and the need to continue to use caution, aspiration precautions, and oral care to increase safety. Pt was also agreeable to try bites of puree, with one additional throat clear observed. Min cues were given to use increased effort with volitional cough and to perform effortful swallows. Would maintain current diet textures for now with additional time post-extubation and potential exercises to maximize return of swallowing function and cough.    HPI HPI: Pt is a 73 y.o. female admitted with progressive gangrene of her left foot and is status post AKA on 09/26/2018. She has demonstrated poor p.o. intake, oral thush, as well as " significant difficulty with secretion clearance, evolving poor cough and increased work of breathing in addition to malnutrition". She developed encephalopathy, was unable to clear airway secretions and required intubation on 1/3 with extubation on 1/13. Nursing reported that NG tube placement was attempted four times without success.  Pt now with Cortrak. Voice largely aphonic.      SLP Plan  Continue with current plan of care       Recommendations  Diet recommendations: Dysphagia 2 (fine chop);Honey-thick liquid Liquids provided via: Cup Medication Administration: Whole meds with puree Supervision: Patient able to self feed;Intermittent supervision to cue for compensatory strategies Compensations: Slow rate;Small sips/bites Postural Changes and/or Swallow Maneuvers: Seated upright 90 degrees                Oral Care Recommendations: Oral care BID;Oral care prior to ice chip/H20 Follow up Recommendations: LTACH SLP Visit Diagnosis: Dysphagia, oropharyngeal phase (R13.12) Plan: Continue with current plan of care       GO                Venita Sheffield Shekera Beavers 10/20/2018, 5:18 PM  [name], M.A. [place] Acute Environmental education officer [phone] Office [phone]

## 2018-10-20 NOTE — Consult Note (Signed)
[facility] Nurse wound follow up Wound type: unstageable MDRPI to left medial thigh, stage 2 right IT, unstageable to sacrum, pressure Measurement:unstageable sacral wound 12cm x 13cm x 1cm 10% pink 10% black and 80% stringy thick grey tightly adhered slough, moderate drainage, blue/green tinged, no odor noted (last day of Dakins) Right IT 6cm x 2.5cm x 0.1cm stage 2 100% pink, no drainage or odor. L medial thigh 1cm x 4cm unknown depth, 100% intact black eschar Wound bed:see above Drainage (amount, consistency, odor) see above Periwound:intact Dressing procedure/placement/frequency:See Hydrotherapy note for further details. Today is the last day of a 5 day use of Dakins. No odor noted today, still slight blue/green tinged drainage. Pt has been diagnosed with C-Diff, flexi-seal in place working well today. At PT hydrotherapy suggestion will decrease Hydrotherapy to M-W-F and order Aquacel Ag+ for dressing changes. Bedside RN to perform T-TH-F-Sat. Will continue to follow and re-assess weekly. If assistance needed prior to next weekly visit, please place new consult or page one of our [place] RNs.  Fara Olden, RN-C, WTA-C, [place] Wound Treatment Associate Ostomy Care Associate

## 2018-10-20 NOTE — Progress Notes (Signed)
Occupational Therapy Treatment Patient Details Name: [name] MRN: [id number] DOB: [birth year] Today's Date: 10/20/2018    History of present illness [name] is a 73 y.o. female with medical history significant of hypertension, hyperlipidemia, diabetes mellitus, COPD, GERD, gout, and anemia, atrial fibrillation and DVT on Eliquis, ESRD-HD (TTS), lupus, PAD, R AKA (05/2018), now s/p L AKA. Pt with AMS and lethargy 10/01/18 transferred to ICU and intubated. Pt also with unstageable sacral injury   OT comments  Pt able to tolerate 15 minutes seated at EOB and participate in simple ADL with varying degrees of assistance. Pt continues to report significant pain in her buttocks. Continues to be appropriate for SNF.  Follow Up Recommendations  SNF;Supervision/Assistance - 24 hour    Equipment Recommendations       Recommendations for Other Services      Precautions / Restrictions Precautions Precautions: Fall Precaution Comments:  flexiseal, extensive wound on buttocks Restrictions RLE Weight Bearing: Non weight bearing LLE Weight Bearing: Non weight bearing       Mobility Bed Mobility Overal bed mobility: Needs Assistance Bed Mobility: Rolling;Supine to Sit;Sit to Supine Rolling: Mod assist   Supine to sit: +2 for physical assistance;Max assist Sit to supine: +2 for physical assistance;Max assist   General bed mobility comments: cues for sequencing, assist for trunk and to position hips  Transfers                      Balance Overall balance assessment: Needs assistance Sitting-balance support: Bilateral upper extremity supported Sitting balance-Leahy Scale: Poor Sitting balance - Comments: with B UE support, x 15 minutes with min guard to max assist, wokred on leaning to side and recovering to midline Postural control: Posterior lean                                 ADL either performed or assessed with clinical judgement   ADL  Overall ADL's : Needs assistance/impaired     Grooming: Wash/dry face;Sitting;Set up   Upper Body Bathing: Maximal assistance;Sitting Upper Body Bathing Details (indicate cue type and reason): for back                                 Vision       Perception     Praxis      Cognition Arousal/Alertness: Awake/alert Behavior During Therapy: WFL for tasks assessed/performed Overall Cognitive Status: Impaired/Different from baseline Area of Impairment: Following commands;Memory;Attention;Problem solving                     Memory: Decreased short-term memory Following Commands: Follows one step commands with increased time     Problem Solving: Slow processing;Difficulty sequencing;Requires verbal cues          Exercises     Shoulder Instructions       General Comments      Pertinent Vitals/ Pain       Pain Assessment: Faces Faces Pain Scale: Hurts whole lot Pain Location: buttocks Pain Descriptors / Indicators: Grimacing;Moaning Pain Intervention(s): Monitored during session;Repositioned  Home Living                                          Prior Functioning/Environment  Frequency  Min 1X/week        Progress Toward Goals  OT Goals(current goals can now be found in the care plan section)  Progress towards OT goals: Progressing toward goals  Acute Rehab OT Goals OT Goal Formulation: With patient Time For Goal Achievement: 11/03/18 Potential to Achieve Goals: [place] Discharge plan remains appropriate    Co-evaluation    PT/OT/SLP Co-Evaluation/Treatment: Yes Reason for Co-Treatment: For patient/therapist safety   OT goals addressed during session: ADL's and self-care;Strengthening/ROM      AM-PAC OT "6 Clicks" Daily Activity     Outcome Measure   Help from another person eating meals?: A Little Help from another person taking care of personal grooming?: A Little Help from another  person toileting, which includes using toliet, bedpan, or urinal?: Total Help from another person bathing (including washing, rinsing, drying)?: A Lot Help from another person to put on and taking off regular upper body clothing?: A Lot Help from another person to put on and taking off regular lower body clothing?: Total 6 Click Score: 12    End of Session    OT Visit Diagnosis: Pain;Muscle weakness (generalized) (M62.81);Other symptoms and signs involving cognitive function   Activity Tolerance Patient tolerated treatment well   Patient Left in bed;with call bell/phone within reach;with family/visitor present   Nurse Communication          Time: 2947-6546 OT Time Calculation (min): 38 min  Charges: OT General Charges $OT Visit: 1 Visit OT Treatments $Self Care/Home Management : 8-22 mins  Nestor Lewandowsky, OTR/L Acute Rehabilitation Services Pager: [phone] Office: [phone]   Malka So 10/20/2018, 1:07 PM

## 2018-10-20 NOTE — Progress Notes (Signed)
[facility]  PROGRESS NOTE  [name] VVO:[id number] DOB: 02/23/1946 DOA: 09/24/2018 PCP: Sharilyn Sites, MD   Brief HPI:    51 yr yr old female with h/o hypertension, dyslipidemia, type 2 diabetes mellitus, COPD, GERD, gout, anemia, atrial fibrillation, DVT, ESRD TTS, peripheral artery disease s/p right AKA, lupus, CHF. Patient had transmetatarsal amputation Sep 06, 2018, developed worsening left foot pain associated with drainage and fever. She was seen at the vascular surgery office and was diagnosed with gangrene of left foot and sent to the hospital for further evaluation. On Dec 29, she underwent left AKA , she developed respiratory failure, worsening encephalopathy and required invasive mechanical ventilation. She remained on rhe ventilator for 10 days from 10/01/18 -- 10/11/18. Her hospital stay has been complicated by septic shock and acute hypoxic respiratory failure due to ESBL E coli, pneumonia, C difficile colitis, and large decubitus ulcer.    Subjective   Patient seen and examined, complains of pain.   Assessment/Plan:     1. Septic shock-resolved, secondary to E. coli pneumonia, she has completed 8 days of antibiotic therapy with meropenem.  2. Left limb ischemia, status post AKA-continue pain management, postop care.  Vascular surgery is following.  Patient was noted to have necrotic lateral stump, she will need revision of the stump as per surgery.  Continue dressing changes as ordered.  3. C. difficile infection-continue vancomycin 125 mg p.o. every 6 hours, rectal tube in place.Stop date 10/26/2018, treatment for total 10 days.  4. Dysphagia-patient had failed swallow evaluation after she was transferred from ICU.  She was started on nasogastric tube for nutrition.  NG tube has been discontinued and patient started on dysphagia 2 diet.  5. Atrial fibrillation-chronic, continue amiodarone, metoprolol.  Continue anticoagulation with Eliquis.  6. Type 2  diabetes mellitus-continue sliding scale insulin with NovoLog.  CBG well controlled.  7. Hypertension-blood pressures well controlled.  8. ESRD on hemodialysis-continue hemodialysis Tuesday, Thursday, Saturday.  She does have a right IJ tunnel catheter in place.  9. Systemic lupus erythematosus-continue prednisone, Plaquenil.  10. Acute on chronic anemia-secondary to upper GI bleed from NG tube trauma.  She required 1 unit of PRBC.  Anticoagulation was held which has been restarted.         CBG: Recent Labs  Lab 10/19/18 2019 10/20/18 0030 10/20/18 0359 10/20/18 0807 10/20/18 1134  GLUCAP 136* 206* 152* 103* 251*    CBC: Recent Labs  Lab 10/16/18 0325 10/17/18 0228 10/18/18 0244 10/19/18 0330 10/20/18 0306  WBC 16.7* 14.1* 14.1* 12.0* 10.6*  HGB 9.5* 9.3* 9.7* 9.7* 9.0*  HCT 31.3* 31.7* 33.6* 33.0* 31.7*  MCV 93.7 92.7 95.2 95.1 96.6  PLT 208 172 193 186 269    Basic Metabolic Panel: Recent Labs  Lab 10/16/18 0325 10/17/18 0228 10/18/18 0244 10/19/18 0330 10/20/18 0306  NA 131* 136 137 133* 136  K 3.3* 2.8* 3.9 4.0 3.7  CL 97* 100 102 101 102  CO2 22 24 22  18* 23  GLUCOSE 243* 157* 108* 111* 169*  BUN 38* 14 37* 58* 22  CREATININE 2.88* 1.35* 2.52* 3.43* 1.84*  CALCIUM 8.2* 8.1* 8.5* 8.9 8.3*  PHOS 4.5 2.2* 3.7 4.8* 2.2*     DVT prophylaxis: Apixaban  Code Status: Full code  Family Communication: No family at bedside  Disposition Plan: LTAC   Consultants:  Vascular surgery  Procedures:  Left AKA   Antibiotics:   Anti-infectives (From admission, onward)   Start     [employer]  Frequency Ordered Stop   10/19/18 1200  vancomycin (VANCOCIN) 50 mg/mL oral solution 125 mg     125 mg Oral Every 6 hours 10/19/18 1022     10/19/18 0000  vancomycin (VANCOCIN) 50 mg/mL oral solution     125 mg Oral Every 6 hours 10/19/18 1028 10/29/18 2359   10/12/18 0930  metroNIDAZOLE (FLAGYL) IVPB 500 mg  Status:  Discontinued     500 mg 100 mL/hr  over 60 Minutes Intravenous Every 8 hours 10/12/18 0919 10/12/18 1437   10/07/18 1130  vancomycin (VANCOCIN) 50 mg/mL oral solution 125 mg     125 mg Per Tube 4 times daily 10/07/18 1055 10/17/18 2359   10/05/18 1600  meropenem (MERREM) 500 mg in sodium chloride 0.9 % 100 mL IVPB     500 mg 200 mL/hr over 30 Minutes Intravenous Every 24 hours 10/05/18 1331 10/11/18 1633   10/03/18 1400  meropenem (MERREM) 500 mg in sodium chloride 0.9 % 100 mL IVPB  Status:  Discontinued     500 mg 200 mL/hr over 30 Minutes Intravenous Every 24 hours 10/03/18 1330 10/05/18 1331   10/02/18 1800  ceFEPIme (MAXIPIME) 2 g in sodium chloride 0.9 % 100 mL IVPB  Status:  Discontinued     2 g 200 mL/hr over 30 Minutes Intravenous Every T-Th-Sa (Hemodialysis) 10/01/18 1341 10/03/18 1322   10/02/18 1800  fluconazole (DIFLUCAN) IVPB 100 mg  Status:  Discontinued     100 mg 50 mL/hr over 60 Minutes Intravenous Every T-Th-Sa (Hemodialysis) 10/01/18 1532 10/03/18 1322   10/02/18 1200  vancomycin (VANCOCIN) 500 mg in sodium chloride 0.9 % 100 mL IVPB  Status:  Discontinued     500 mg 100 mL/hr over 60 Minutes Intravenous Every T-Th-Sa (Hemodialysis) 10/01/18 1341 10/04/18 1524   10/01/18 1400  metroNIDAZOLE (FLAGYL) IVPB 500 mg  Status:  Discontinued     500 mg 100 mL/hr over 60 Minutes Intravenous Every 8 hours 10/01/18 1238 10/03/18 1322   10/01/18 1300  ceFEPIme (MAXIPIME) 2 g in sodium chloride 0.9 % 100 mL IVPB     2 g 200 mL/hr over 30 Minutes Intravenous  Once 10/01/18 1238 10/01/18 1626   10/01/18 1300  vancomycin (VANCOCIN) IVPB 1000 mg/200 mL premix     1,000 mg 200 mL/hr over 60 Minutes Intravenous  Once 10/01/18 1238 10/01/18 1541   10/01/18 0000  fluconazole (DIFLUCAN) tablet 100 mg  Status:  Discontinued     100 mg Oral Once per day on Tue Thu Sat 09/30/18 1440 10/01/18 1517   09/30/18 1530  fluconazole (DIFLUCAN) tablet 200 mg     200 mg Oral  Once 09/30/18 1440 09/30/18 1709   09/25/18 1200   vancomycin (VANCOCIN) IVPB 750 mg/150 ml premix  Status:  Discontinued     750 mg 150 mL/hr over 60 Minutes Intravenous Every T-Th-Sa (Hemodialysis) 09/24/18 2116 09/27/18 1104   09/25/18 1000  hydroxychloroquine (PLAQUENIL) tablet 200 mg  Status:  Discontinued     200 mg Oral Daily 09/25/18 0033 10/01/18 1517   09/24/18 2030  cefTRIAXone (ROCEPHIN) 1 g in sodium chloride 0.9 % 100 mL IVPB  Status:  Discontinued     1 g 200 mL/hr over 30 Minutes Intravenous Every 24 hours 09/24/18 2022 09/27/18 1104   09/24/18 2030  vancomycin (VANCOCIN) IVPB 1000 mg/200 mL premix     1,000 mg 200 mL/hr over 60 Minutes Intravenous  Once 09/24/18 2030 09/25/18 0408   09/24/18 1815  piperacillin-tazobactam (ZOSYN) IVPB 3.375  g     3.375 g 100 mL/hr over 30 Minutes Intravenous  Once 09/24/18 1803 09/24/18 1933       Objective   Vitals:   10/19/18 1551 10/19/18 1659 10/19/18 2029 10/20/18 0401  BP: (!) 165/49 (!) 145/53 (!) 122/33 (!) 122/21  Pulse: 83 87 96 88  Resp: 16 (!) 23 15 20   Temp: 98.2 F (36.8 C)  97.6 F (36.4 C) 98.6 F (37 C)  TempSrc: Oral  Oral Oral  SpO2: 100% 100% 100% 100%  Weight: 54.5 kg   52.6 kg  Height:        Intake/Output Summary (Last 24 hours) at 10/20/2018 1313 Last data filed at 10/20/2018 1007 Gross per 24 hour  Intake 1512.57 ml  Output 560 ml  Net 952.57 ml   Filed Weights   10/19/18 1214 10/19/18 1551 10/20/18 0401  Weight: 54.4 kg 54.5 kg 52.6 kg     Physical Examination:    General: Appears in no acute distress  Cardiovascular: S1-S2, no murmur, no murmurs auscultated  Respiratory: Clear to auscultation bilaterally, no wheezing or crackles auscultated  Abdomen: Abdomen is soft, mild tenderness in right lower quadrant, no rigidity or guarding  Extremities: Bilateral AKA  Neurologic: Alert, oriented x3, cranial nerves II through grossly intact, no focal deficit noted at this time.     Data Reviewed: I have personally reviewed following  labs and imaging studies   No results found for this or any previous visit (from the past 240 hour(s)).   Liver Function Tests: Recent Labs  Lab 10/16/18 0325 10/17/18 0228 10/18/18 0244 10/19/18 0330 10/20/18 0306  ALBUMIN 1.6* 2.1* 1.9* 1.9* 1.7*   No results for input(s): LIPASE, AMYLASE in the last 168 hours. No results for input(s): AMMONIA in the last 168 hours.  Cardiac Enzymes: No results for input(s): CKTOTAL, CKMB, CKMBINDEX, TROPONINI in the last 168 hours. BNP (last 3 results) Recent Labs    11/03/17 0801  BNP 3,320.0*    ProBNP (last 3 results) No results for input(s): PROBNP in the last 8760 hours.    Studies: Dg Swallowing Func-speech Pathology  Result Date: 10/19/2018 Objective Swallowing Evaluation: Type of Study: MBS-Modified Barium Swallow Study  Patient Details Name: [name] MRN: [id number] Date of Birth: April 17, 1946 Today's Date: 10/19/2018 Time: SLP Start Time (ACUTE ONLY): 0930 -SLP Stop Time (ACUTE ONLY): 1005 SLP Time Calculation (min) (ACUTE ONLY): 35 min Past Medical History: Past Medical History: Diagnosis Date . Anemia  . Arthritis  . Asthma  . Atrial fibrillation ([place])  . Atrial flutter ([place])   a. s/p TEE/DCCV December 2013 b. 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 Bone spur of toe   Right 5th toe . Chronic diastolic CHF (congestive heart failure) ([facility])   a. EF 50-55% by echo in 2015 . Colon polyposis  . COPD (chronic obstructive pulmonary disease) ([place])  . DVT of upper extremity (deep vein thrombosis) ([place])   Right basilic vein, June 2549 . ESRD (end stage renal disease) on dialysis ([place])   "TTF; ; off [employer]." (09/07/2018) . Essential hypertension, benign  . Fractures  . Glaucoma  . Lupus ([place])  . Pericarditis  . SLE (systemic lupus erythematosus) ([place])  .  Type 2 diabetes mellitus ([place])  . UTI (lower urinary tract infection)  . Wound of right leg 06/2017 Past Surgical History:  Past Surgical History: Procedure Laterality Date . ABDOMINAL AORTOGRAM W/LOWER EXTREMITY N/A 12/18/2017  Procedure: ABDOMINAL AORTOGRAM W/LOWER EXTREMITY;  Surgeon: Elam Dutch, MD;  Location: [facility];  Service: Cardiovascular;  Laterality: N/A;  bilateral . ABDOMINAL HYSTERECTOMY  1983 . AMPUTATION Right 06/08/2018  Procedure: RIGHT ABOVE KNEE AMPUTATION;  Surgeon: Elam Dutch, MD;  Location[facility] OR;  Service: Vascular;  Laterality: Right; . AMPUTATION Left 09/26/2018  Procedure: AMPUTATION ABOVE KNEE;  Surgeon: Rosetta Posner, MD;  Location: [facility] OR;  Service: Vascular;  Laterality: Left;  WILL BE AN INPATIENT . ANKLE SURGERY  1993 . AV FISTULA PLACEMENT Left 03/27/2014  Procedure: ARTERIOVENOUS (AV) FISTULA CREATION;  Surgeon: Rosetta Posner, MD;  Location: [facility];  Service: Vascular;  Laterality: Left; . BACK SURGERY  1980 . BASCILIC VEIN TRANSPOSITION Left 01/13/2018  Procedure: LEFT BASILIC VEIN TRANSPOSITION SECOND STAGE;  Surgeon: Elam Dutch, MD;  Location: [facility];  Service: Vascular;  Laterality: Left; . CARDIOVASCULAR STRESS TEST  12/19/2009  no stress induced rhythm abnormalities, ekg negative for ischemia . CARDIOVERSION  09/16/2012  Procedure: CARDIOVERSION;  Surgeon: Sanda Klein, MD;  Location: [facility] ENDOS[facility];  Service: Cardiovascular;  Laterality: N/A; . CATARACT EXTRACTION   . COLONOSCOPY  09/20/2012  Dr. [name]: multiple tubular adenomas  . COLONOSCOPY  2005  Dr. [name]. Polyps, path unknown  . COLONOSCOPY N/A 07/24/2016  Dr. [name]: 3 significant size polyps removed from the colon, tubular adenomas.  Next colonoscopy in October 2020. Marland Kitchen ESOPHAGOGASTRODUODENOSCOPY  09/20/2012  Dr. [name]: duodenal erosion and possible resolving ulcer at the angularis  . ESOPHAGOGASTRODUODENOSCOPY N/A 03/24/2018  Dr. [name]: Mild erosive reflux esophagitis, erosive gastropathy and  enteropathy fairly extensive, no H. pylori on biopsy. Marland Kitchen EYE SURGERY   . IR FLUORO GUIDE CV LINE RIGHT  11/09/2017 . IR RADIOLOGIST EVAL & MGMT  09/01/2018 . IR US GUIDE VASC ACCESS RIGHT  11/09/2017 . REVISON OF ARTERIOVENOUS FISTULA Left 03/15/2018  Procedure: REVISION OF Left arm BASILIC VEIN TRANSPOSITION;  Surgeon: Angelia Mould, MD;  Location: [facility];  Service: Vascular;  Laterality: Left; . TEE WITHOUT CARDIOVERSION  09/16/2012  Procedure: TRANSESOPHAGEAL ECHOCARDIOGRAM (TEE);  Surgeon: Sanda Klein, MD;  Location: [facility] ENDOSCOPY;  Service: Cardiovascular;  Laterality: N/A; . TRANSESOPHAGEAL ECHOCARDIOGRAM WITH CARDIOVERSION  09/16/2012  EF 60-65%, moderate LVH, moderate regurg of the aortic valve, LA moderately dilated . TRANSMETATARSAL AMPUTATION Left 09/06/2018  Procedure: TRANSMETATARSAL AMPUTATION;  Surgeon: Angelia Mould, MD;  Location[facility] OR;  Service: Vascular;  Laterality: Left; HPI: Pt is a 73 y.o. female admitted with progressive gangrene of her left foot and is status post AKA on 09/26/2018. She has demonstrated poor p.o. intake, oral thush, as well as " significant difficulty with secretion clearance, evolving poor cough and increased work of breathing in addition to malnutrition". She developed encephalopathy, was unable to clear airway secretions and required intubation on 1/3 with extubation on 1/13. Nursing reported that NG tube placement was attempted four times without success.  Pt now with Cortrak. Voice largely aphonic.  Subjective: Pt awake and alert, pleasant and cooperative. No family present. Pt reports she feels her voice quality is slightly improved. Still largely aphonic. Assessment / Plan / Recommendation CHL IP CLINICAL IMPRESSIONS 10/19/2018 Clinical Impression Patient has been NPO at bedside with cortrac present for nutrition and ice chips with nursing. 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  Patient  is also on a specialty bed for a large  decubitus ulcer impacting her ability to sit at 90 degrees for meals. Patient has a mild to moderate oropharyngeal dysphagia likely impacted by long intubation as well as deconditioned state after long hospitalization. She required 2 swallows to clear thin liquid bolus, but only one with thickened liquids. Delayed swallow initiation at the pyriform sinuses with thin liquids and the valleculae for all other consistencies. Reduced laryngeal elevation and anterior movement resulting in poor laryngeal closure and airway protection across all consistencies. Small amounts of consistent penetration of thin and nectar thickened liquids with no triggered cough response and eventually aspirated below the cords. Patient had a delayed cough in response to thin liquid passing below the cords but unable to fully clear. Mild pharyngeal residue impacted by NG tube. Patient has a fair to good prognosis for swallow to improve as her over all medical status improves, NG tube is removed and her ability to sit for longer periods improves. Recommend a Dys 2, Honey thickened diet with medications given whole in puree. Speech therapy to follow up for diet tolerance, aspiration precaution training, pharyngeal exercise and diet upgrade.  SLP Visit Diagnosis Dysphagia, oropharyngeal phase (R13.12) Attention and concentration deficit following -- Frontal lobe and executive function deficit following -- Impact on safety and function Moderate aspiration risk   CHL IP TREATMENT RECOMMENDATION 10/19/2018 Treatment Recommendations Therapy as outlined in treatment plan below   Prognosis 10/19/2018 Prognosis for Safe Diet Advancement Good Barriers to Reach Goals Time post onset;Severity of deficits Barriers/Prognosis Comment -- CHL IP DIET RECOMMENDATION 10/19/2018 SLP Diet Recommendations Dysphagia 2 (Fine chop) solids;Honey thick liquids;Ice chips PRN after oral care Liquid Administration via Cup Medication  Administration Whole meds with puree Compensations Slow rate;Small sips/bites Postural Changes Seated upright at 90 degrees   CHL IP OTHER RECOMMENDATIONS 10/19/2018 Recommended Consults -- Oral Care Recommendations Oral care QID Other Recommendations Order thickener from pharmacy   CHL IP FOLLOW UP RECOMMENDATIONS 10/19/2018 Follow up Recommendations Skilled Nursing facility   [facility] IP FREQUENCY AND DURATION 10/12/2018 Speech Therapy Frequency (ACUTE ONLY) min 2x/week Treatment Duration 2 weeks      CHL IP ORAL PHASE 10/19/2018 Oral Phase Impaired Oral - Pudding Teaspoon -- Oral - Pudding Cup -- Oral - Honey Teaspoon -- Oral - Honey Cup -- Oral - Nectar Teaspoon -- Oral - Nectar Cup -- Oral - Nectar Straw -- Oral - Thin Teaspoon -- Oral - Thin Cup Piecemeal swallowing;Weak lingual manipulation Oral - Thin Straw -- Oral - Puree -- Oral - Mech Soft Weak lingual manipulation;Impaired mastication Oral - Regular -- Oral - Multi-Consistency -- Oral - Pill -- Oral Phase - Comment --  CHL IP PHARYNGEAL PHASE 10/19/2018 Pharyngeal Phase Impaired Pharyngeal- Pudding Teaspoon -- Pharyngeal -- Pharyngeal- Pudding Cup -- Pharyngeal -- Pharyngeal- Honey Teaspoon Delayed swallow initiation-vallecula;Reduced pharyngeal peristalsis;Reduced anterior laryngeal mobility;Reduced laryngeal elevation;Reduced airway/laryngeal closure Pharyngeal Material does not enter airway Pharyngeal- Honey Cup -- Pharyngeal -- Pharyngeal- Nectar Teaspoon NT Pharyngeal -- Pharyngeal- Nectar Cup Delayed swallow initiation-vallecula;Reduced pharyngeal peristalsis;Reduced epiglottic inversion;Reduced anterior laryngeal mobility;Reduced laryngeal elevation;Reduced airway/laryngeal closure;Penetration/Aspiration during swallow;Trace aspiration;Pharyngeal residue - valleculae Pharyngeal Material enters airway, passes BELOW cords and not ejected out despite cough attempt by patient Pharyngeal- Nectar Straw -- Pharyngeal -- Pharyngeal- Thin Teaspoon -- Pharyngeal --  Pharyngeal- Thin Cup Delayed swallow initiation-pyriform sinuses;Reduced pharyngeal peristalsis;Reduced epiglottic inversion;Reduced anterior laryngeal mobility;Reduced laryngeal elevation;Reduced airway/laryngeal closure;Penetration/Aspiration during swallow;Trace aspiration;Pharyngeal residue - valleculae Pharyngeal Material enters airway, passes BELOW cords and not ejected out despite cough attempt by patient Pharyngeal- Thin Straw --  Pharyngeal -- Pharyngeal- Puree Delayed swallow initiation-vallecula;Reduced pharyngeal peristalsis;Reduced epiglottic inversion;Reduced anterior laryngeal mobility;Reduced laryngeal elevation;Reduced airway/laryngeal closure Pharyngeal -- Pharyngeal- Mechanical Soft Pharyngeal residue - valleculae;Delayed swallow initiation-vallecula Pharyngeal -- Pharyngeal- Regular -- Pharyngeal -- Pharyngeal- Multi-consistency -- Pharyngeal -- Pharyngeal- Pill -- Pharyngeal -- Pharyngeal Comment --  CHL IP CERVICAL ESOPHAGEAL PHASE 10/19/2018 Cervical Esophageal Phase WFL Pudding Teaspoon -- Pudding Cup -- Honey Teaspoon -- Honey Cup -- Nectar Teaspoon -- Nectar Cup -- Nectar Straw -- Thin Teaspoon -- Thin Cup -- Thin Straw -- Puree -- Mechanical Soft -- Regular -- Multi-consistency -- Pill -- Cervical Esophageal Comment -- [name] Ward 10/19/2018, 10:56 AM               Scheduled Meds: . acetaminophen  650 mg Oral Q6H  . amiodarone  200 mg Per Tube Daily  . apixaban  2.5 mg Oral BID  . brimonidine  1 drop Left Eye BID   And  . timolol  1 drop Left Eye BID  . Chlorhexidine Gluconate Cloth  6 each Topical Q0600  . Chlorhexidine Gluconate Cloth  6 each Topical Q0600  . darbepoetin (ARANESP) injection - DIALYSIS  200 mcg Intravenous Q Sat-HD  . feeding supplement (PRO-STAT SUGAR FREE 64)  60 mL Per Tube BID  . insulin aspart  0-9 Units Subcutaneous Q4H  . latanoprost  1 drop Both Eyes QHS  . pantoprazole  40 mg Oral BID  . predniSONE  10 mg Oral Q breakfast  . sodium chloride flush   3 mL Intravenous Q12H  . sucralfate  1 g Oral TID WC & HS  . vancomycin  125 mg Oral Q6H    Admission status: Inpatient: Based on patients clinical presentation and evaluation of above clinical data, I have made determination that patient meets Inpatient criteria at this time.  Time spent: 25 min  [facility] Pager [phone]. If 7PM-7AM, please contact night-coverage at [email or web address], Office  [phone]  password [email or web address]  10/20/2018, 1:13 PM  LOS: 26 days

## 2018-10-21 LAB — RENAL FUNCTION PANEL
Albumin: 1.8 g/dL — ABNORMAL LOW (ref 3.5–5.0)
Anion gap: 13 (ref 5–15)
BUN: 49 mg/dL — ABNORMAL HIGH (ref 8–23)
CO2: 21 mmol/L — ABNORMAL LOW (ref 22–32)
Calcium: 8.5 mg/dL — ABNORMAL LOW (ref 8.9–10.3)
Chloride: 100 mmol/L (ref 98–111)
Creatinine, Ser: 3.24 mg/dL — ABNORMAL HIGH (ref 0.44–1.00)
GFR calc Af Amer: 16 mL/min — ABNORMAL LOW (ref >60)
GFR calc non Af Amer: 14 mL/min — ABNORMAL LOW (ref >60)
Glucose, Bld: 127 mg/dL — ABNORMAL HIGH (ref 70–99)
PHOSPHORUS: 3.4 mg/dL (ref 2.5–4.6)
POTASSIUM: 3.9 mmol/L (ref 3.5–5.1)
Sodium: 134 mmol/L — ABNORMAL LOW (ref 135–145)

## 2018-10-21 LAB — GLUCOSE, CAPILLARY
GLUCOSE-CAPILLARY: 120 mg/dL — AB (ref 70–99)
Glucose-Capillary: 114 mg/dL — ABNORMAL HIGH (ref 70–99)
Glucose-Capillary: 284 mg/dL — ABNORMAL HIGH (ref 70–99)
Glucose-Capillary: 311 mg/dL — ABNORMAL HIGH (ref 70–99)

## 2018-10-21 MED ORDER — HEPARIN SODIUM (PORCINE) 1000 UNIT/ML IJ SOLN
INTRAMUSCULAR | Status: AC
  Filled 2018-10-21: qty 2

## 2018-10-21 MED ORDER — FENTANYL CITRATE (PF) 100 MCG/2ML IJ SOLN
INTRAMUSCULAR | Status: AC
  Administered 2018-10-21: 12.5 ug via INTRAVENOUS
  Filled 2018-10-21: qty 2

## 2018-10-21 MED ORDER — HEPARIN SODIUM (PORCINE) 1000 UNIT/ML IJ SOLN
INTRAMUSCULAR | Status: AC
  Administered 2018-10-21: 1800 [IU]
  Filled 2018-10-21: qty 2

## 2018-10-21 MED ORDER — INSULIN ASPART 100 UNIT/ML ~~LOC~~ SOLN
0.0000 [IU] | Freq: Three times a day (TID) | SUBCUTANEOUS | Status: DC
  Administered 2018-10-22 (×2): 2 [IU] via SUBCUTANEOUS
  Administered 2018-10-22: 7 [IU] via SUBCUTANEOUS
  Administered 2018-10-23: 2 [IU] via SUBCUTANEOUS

## 2018-10-21 MED ORDER — FENTANYL CITRATE (PF) 100 MCG/2ML IJ SOLN
12.5000 ug | INTRAMUSCULAR | Status: AC | PRN
  Administered 2018-10-21: 12.5 ug via INTRAVENOUS

## 2018-10-21 NOTE — Progress Notes (Signed)
Subjective: seen on HD, no new c/o's.   Objective Vital signs in last 24 hours: Vitals:   10/21/18 0800 10/21/18 0830 10/21/18 0900 10/21/18 0930  BP: (!) 139/36 (!) 129/41 (!) 136/23 137/82  Pulse: 93 87 93 100  Resp:      Temp:      TempSrc:      SpO2:      Weight:      Height:       Weight change:   Intake/Output Summary (Last 24 hours) at 10/21/2018 1032 Last data filed at 10/20/2018 1700 Gross per 24 hour  Intake 280 ml  Output -  Net 280 ml   Physical Exam: General: soft spoken- - frail bilat amputee seen on HD Heart: RRR Lungs: mostly clear Abdomen: soft, non tender, large post sacral decub 5x 6 inches, 1/2" deep Extremities: no LE edema, bilat AKA Dialysis Access: right sided TDC     Dialysis Orders:  [facility] TTS  3.5h 61kg 400/800 Hep 1800 R TDC Mircera 225 q2weeks (last 09/14/18)  Summary Pt is a 73 y.o. yo female ESRD who was admitted on 09/24/2018 with FTT after AKA culminating with PNA and respiratory failure req vent   Assessment: 1. Pulm - PNA and resp failure, resolved   2. C diff- on PO vanc, rectal tube, not sure getting better yet? 3. Sacral decub - large and deep. Getting hydroRx 4. L AKA -is now bilat amputee 5. ESRD HD TTS - HD today 6. Anemia ckd- req transfusion this hosp- on max darbe- no iron right now 7. MBD ckd - phos low, holding phoslyra - no recent PTH data here 8. HTN/volume- BP stable, no bp meds. Well under dry wt here (52/61kg) 9. FTT- low albumin/decub that is large - has had palliative discussion but family requesting continued max efforts, cont with these discussions 10. Renal mass- not of clinical significance- would not chase down. 11. Dispo - appreciate case management assist- she does need to be able to sit up for OP HD which she is not strong enough to do right now. Will need LTACH most likely  Plan: 1. HD today   Kelly Splinter MD [facility] pgr [phone]   10/21/2018, 10:32  AM        Labs: Basic Metabolic Panel: Recent Labs  Lab 10/19/18 0330 10/20/18 0306 10/21/18 0312  NA 133* 136 134*  K 4.0 3.7 3.9  CL 101 102 100  CO2 18* 23 21*  GLUCOSE 111* 169* 127*  BUN 58* 22 49*  CREATININE 3.43* 1.84* 3.24*  CALCIUM 8.9 8.3* 8.5*  PHOS 4.8* 2.2* 3.4   Liver Function Tests: Recent Labs  Lab 10/19/18 0330 10/20/18 0306 10/21/18 0312  ALBUMIN 1.9* 1.7* 1.8*   No results for input(s): LIPASE, AMYLASE in the last 168 hours. No results for input(s): AMMONIA in the last 168 hours. CBC: Recent Labs  Lab 10/16/18 0325 10/17/18 0228 10/18/18 0244 10/19/18 0330 10/20/18 0306  WBC 16.7* 14.1* 14.1* 12.0* 10.6*  HGB 9.5* 9.3* 9.7* 9.7* 9.0*  HCT 31.3* 31.7* 33.6* 33.0* 31.7*  MCV 93.7 92.7 95.2 95.1 96.6  PLT 208 172 193 186 159   Cardiac Enzymes: No results for input(s): CKTOTAL, CKMB, CKMBINDEX, TROPONINI in the last 168 hours. CBG: Recent Labs  Lab 10/20/18 1134 10/20/18 1646 10/20/18 2020 10/20/18 2351 10/21/18 0445  GLUCAP 251* 244* 203* 116* 114*    Iron Studies:  No results for input(s): IRON, TIBC, TRANSFERRIN, FERRITIN in the last 72  hours. Studies/Results: No results found. Medications: Infusions: . sodium chloride Stopped (10/11/18 0902)    Scheduled Medications: . acetaminophen  650 mg Oral Q6H  . amiodarone  200 mg Per Tube Daily  . apixaban  2.5 mg Oral BID  . brimonidine  1 drop Left Eye BID   And  . timolol  1 drop Left Eye BID  . Chlorhexidine Gluconate Cloth  6 each Topical Q0600  . Chlorhexidine Gluconate Cloth  6 each Topical Q0600  . darbepoetin (ARANESP) injection - DIALYSIS  200 mcg Intravenous Q Sat-HD  . feeding supplement (PRO-STAT SUGAR FREE 64)  60 mL Per Tube BID  . heparin      . insulin aspart  0-9 Units Subcutaneous Q4H  . latanoprost  1 drop Both Eyes QHS  . pantoprazole  40 mg Oral BID  . predniSONE  10 mg Oral Q breakfast  . sodium chloride flush  3 mL Intravenous Q12H  . sucralfate   1 g Oral TID WC & HS  . vancomycin  125 mg Oral Q6H    have reviewed scheduled and prn medications.

## 2018-10-21 NOTE — Progress Notes (Signed)
[facility]  PROGRESS NOTE  [name] XTK:[id number] DOB: [birth year] DOA: 09/24/2018 PCP: Sharilyn Sites, MD   Brief HPI:    49 yr yr old female with h/o hypertension, dyslipidemia, type 2 diabetes mellitus, COPD, GERD, gout, anemia, atrial fibrillation, DVT, ESRD TTS, peripheral artery disease s/p right AKA, lupus, CHF. Patient had transmetatarsal amputation Sep 06, 2018, developed worsening left foot pain associated with drainage and fever. She was seen at the vascular surgery office and was diagnosed with gangrene of left foot and sent to the hospital for further evaluation. On Dec 29, she underwent left AKA , she developed respiratory failure, worsening encephalopathy and required invasive mechanical ventilation. She remained on rhe ventilator for 10 days from 10/01/18 -- 10/11/18. Her hospital stay has been complicated by septic shock and acute hypoxic respiratory failure due to ESBL E coli, pneumonia, C difficile colitis, and large decubitus ulcer.    Subjective   Patient seen and examined, seen during hemodialysis.  Complains of back pain due to position in the bed.   Assessment/Plan:     1. Septic shock-resolved, secondary to E. coli pneumonia, she has completed 8 days of antibiotic therapy with meropenem.  2. Left limb ischemia, status post AKA-continue pain management, postop care.  Vascular surgery is following.  Patient was noted to have necrotic lateral stump, she will need revision of the stump as per surgery.  Continue dressing changes as ordered.  3. C. difficile infection-continue vancomycin 125 mg p.o. every 6 hours, rectal tube in place.Stop date 10/26/2018, treatment for total 10 days.  4. Dysphagia-patient had failed swallow evaluation after she was transferred from ICU.  She was started on nasogastric tube for nutrition.  NG tube has been discontinued and patient started on dysphagia 2 diet.  5. Atrial fibrillation-chronic, continue amiodarone,  metoprolol.  Continue anticoagulation with Eliquis.  6. Type 2 diabetes mellitus-continue sliding scale insulin with NovoLog.  CBG well controlled.  7. Hypertension-blood pressures well controlled.  8. ESRD on hemodialysis-continue hemodialysis Tuesday, Thursday, Saturday.  She does have a right IJ tunnel catheter in place.  9. Systemic lupus erythematosus-continue prednisone, Plaquenil.  10. Acute on chronic anemia-secondary to upper GI bleed from NG tube trauma.  She required 1 unit of PRBC.  Anticoagulation was held which has been restarted.         CBG: Recent Labs  Lab 10/20/18 1134 10/20/18 1646 10/20/18 2020 10/20/18 2351 10/21/18 0445  GLUCAP 251* 244* 203* 116* 114*    CBC: Recent Labs  Lab 10/16/18 0325 10/17/18 0228 10/18/18 0244 10/19/18 0330 10/20/18 0306  WBC 16.7* 14.1* 14.1* 12.0* 10.6*  HGB 9.5* 9.3* 9.7* 9.7* 9.0*  HCT 31.3* 31.7* 33.6* 33.0* 31.7*  MCV 93.7 92.7 95.2 95.1 96.6  PLT 208 172 193 186 992    Basic Metabolic Panel: Recent Labs  Lab 10/17/18 0228 10/18/18 0244 10/19/18 0330 10/20/18 0306 10/21/18 0312  NA 136 137 133* 136 134*  K 2.8* 3.9 4.0 3.7 3.9  CL 100 102 101 102 100  CO2 24 22 18* 23 21*  GLUCOSE 157* 108* 111* 169* 127*  BUN 14 37* 58* 22 49*  CREATININE 1.35* 2.52* 3.43* 1.84* 3.24*  CALCIUM 8.1* 8.5* 8.9 8.3* 8.5*  PHOS 2.2* 3.7 4.8* 2.2* 3.4     DVT prophylaxis: Apixaban  Code Status: Full code  Family Communication: No family at bedside  Disposition Plan: LTAC   Consultants:  Vascular surgery  Procedures:  Left AKA   Antibiotics:   Anti-infectives (From  admission, onward)   Start     Dose/Rate Route Frequency Ordered Stop   10/19/18 1200  vancomycin (VANCOCIN) 50 mg/mL oral solution 125 mg     125 mg Oral Every 6 hours 10/19/18 1022     10/19/18 0000  vancomycin (VANCOCIN) 50 mg/mL oral solution     125 mg Oral Every 6 hours 10/19/18 1028 10/29/18 2359   10/12/18 0930  metroNIDAZOLE  (FLAGYL) IVPB 500 mg  Status:  Discontinued     500 mg 100 mL/hr over 60 Minutes Intravenous Every 8 hours 10/12/18 0919 10/12/18 1437   10/07/18 1130  vancomycin (VANCOCIN) 50 mg/mL oral solution 125 mg     125 mg Per Tube 4 times daily 10/07/18 1055 10/17/18 2359   10/05/18 1600  meropenem (MERREM) 500 mg in sodium chloride 0.9 % 100 mL IVPB     500 mg 200 mL/hr over 30 Minutes Intravenous Every 24 hours 10/05/18 1331 10/11/18 1633   10/03/18 1400  meropenem (MERREM) 500 mg in sodium chloride 0.9 % 100 mL IVPB  Status:  Discontinued     500 mg 200 mL/hr over 30 Minutes Intravenous Every 24 hours 10/03/18 1330 10/05/18 1331   10/02/18 1800  ceFEPIme (MAXIPIME) 2 g in sodium chloride 0.9 % 100 mL IVPB  Status:  Discontinued     2 g 200 mL/hr over 30 Minutes Intravenous Every T-Th-Sa (Hemodialysis) 10/01/18 1341 10/03/18 1322   10/02/18 1800  fluconazole (DIFLUCAN) IVPB 100 mg  Status:  Discontinued     100 mg 50 mL/hr over 60 Minutes Intravenous Every T-Th-Sa (Hemodialysis) 10/01/18 1532 10/03/18 1322   10/02/18 1200  vancomycin (VANCOCIN) 500 mg in sodium chloride 0.9 % 100 mL IVPB  Status:  Discontinued     500 mg 100 mL/hr over 60 Minutes Intravenous Every T-Th-Sa (Hemodialysis) 10/01/18 1341 10/04/18 1524   10/01/18 1400  metroNIDAZOLE (FLAGYL) IVPB 500 mg  Status:  Discontinued     500 mg 100 mL/hr over 60 Minutes Intravenous Every 8 hours 10/01/18 1238 10/03/18 1322   10/01/18 1300  ceFEPIme (MAXIPIME) 2 g in sodium chloride 0.9 % 100 mL IVPB     2 g 200 mL/hr over 30 Minutes Intravenous  Once 10/01/18 1238 10/01/18 1626   10/01/18 1300  vancomycin (VANCOCIN) IVPB 1000 mg/200 mL premix     1,000 mg 200 mL/hr over 60 Minutes Intravenous  Once 10/01/18 1238 10/01/18 1541   10/01/18 0000  fluconazole (DIFLUCAN) tablet 100 mg  Status:  Discontinued     100 mg Oral Once per day on Tue Thu Sat 09/30/18 1440 10/01/18 1517   09/30/18 1530  fluconazole (DIFLUCAN) tablet 200 mg     200 mg  Oral  Once 09/30/18 1440 09/30/18 1709   09/25/18 1200  vancomycin (VANCOCIN) IVPB 750 mg/150 ml premix  Status:  Discontinued     750 mg 150 mL/hr over 60 Minutes Intravenous Every T-Th-Sa (Hemodialysis) 09/24/18 2116 09/27/18 1104   09/25/18 1000  hydroxychloroquine (PLAQUENIL) tablet 200 mg  Status:  Discontinued     200 mg Oral Daily 09/25/18 0033 10/01/18 1517   09/24/18 2030  cefTRIAXone (ROCEPHIN) 1 g in sodium chloride 0.9 % 100 mL IVPB  Status:  Discontinued     1 g 200 mL/hr over 30 Minutes Intravenous Every 24 hours 09/24/18 2022 09/27/18 1104   09/24/18 2030  vancomycin (VANCOCIN) IVPB 1000 mg/200 mL premix     1,000 mg 200 mL/hr over 60 Minutes Intravenous  Once 09/24/18 2030  09/25/18 0408   09/24/18 1815  piperacillin-tazobactam (ZOSYN) IVPB 3.375 g     3.375 g 100 mL/hr over 30 Minutes Intravenous  Once 09/24/18 1803 09/24/18 1933       Objective   Vitals:   10/21/18 0800 10/21/18 0830 10/21/18 0900 10/21/18 0930  BP: (!) 139/36 (!) 129/41 (!) 136/23 137/82  Pulse: 93 87 93 100  Resp:      Temp:      TempSrc:      SpO2:      Weight:      Height:        Intake/Output Summary (Last 24 hours) at 10/21/2018 1117 Last data filed at 10/20/2018 1700 Gross per 24 hour  Intake 280 ml  Output -  Net 280 ml   Filed Weights   10/19/18 1551 10/20/18 0401 10/21/18 0715  Weight: 54.5 kg 52.6 kg 54 kg     Physical Examination:    General: Appears in no acute distress  Cardiovascular: S1-S2, regular, no murmur auscultated  Respiratory: Clear to auscultation bilaterally  Abdomen: Soft, nontender, no organomegaly  Musculoskeletal: Status post bilateral AKA     Data Reviewed: I have personally reviewed following labs and imaging studies   No results found for this or any previous visit (from the past 240 hour(s)).   Liver Function Tests: Recent Labs  Lab 10/17/18 0228 10/18/18 0244 10/19/18 0330 10/20/18 0306 10/21/18 0312  ALBUMIN 2.1* 1.9* 1.9*  1.7* 1.8*   No results for input(s): LIPASE, AMYLASE in the last 168 hours. No results for input(s): AMMONIA in the last 168 hours.  Cardiac Enzymes: No results for input(s): CKTOTAL, CKMB, CKMBINDEX, TROPONINI in the last 168 hours. BNP (last 3 results) Recent Labs    11/03/17 0801  BNP 3,320.0*    ProBNP (last 3 results) No results for input(s): PROBNP in the last 8760 hours.    Studies: No results found.  Scheduled Meds: . acetaminophen  650 mg Oral Q6H  . amiodarone  200 mg Per Tube Daily  . apixaban  2.5 mg Oral BID  . brimonidine  1 drop Left Eye BID   And  . timolol  1 drop Left Eye BID  . Chlorhexidine Gluconate Cloth  6 each Topical Q0600  . Chlorhexidine Gluconate Cloth  6 each Topical Q0600  . darbepoetin (ARANESP) injection - DIALYSIS  200 mcg Intravenous Q Sat-HD  . feeding supplement (PRO-STAT SUGAR FREE 64)  60 mL Per Tube BID  . heparin      . insulin aspart  0-9 Units Subcutaneous Q4H  . latanoprost  1 drop Both Eyes QHS  . pantoprazole  40 mg Oral BID  . predniSONE  10 mg Oral Q breakfast  . sodium chloride flush  3 mL Intravenous Q12H  . sucralfate  1 g Oral TID WC & HS  . vancomycin  125 mg Oral Q6H    Admission status: Inpatient: Based on patients clinical presentation and evaluation of above clinical data, I have made determination that patient meets Inpatient criteria at this time.  Time spent: 25 min  [facility] Pager [phone]. If 7PM-7AM, please contact night-coverage at [email or web address], Office  [phone]  password [email or web address]  10/21/2018, 11:17 AM  LOS: 27 days

## 2018-10-22 LAB — RENAL FUNCTION PANEL
Albumin: 1.7 g/dL — ABNORMAL LOW (ref 3.5–5.0)
Anion gap: 10 (ref 5–15)
BUN: 31 mg/dL — AB (ref 8–23)
CO2: 24 mmol/L (ref 22–32)
CREATININE: 2.51 mg/dL — AB (ref 0.44–1.00)
Calcium: 8.1 mg/dL — ABNORMAL LOW (ref 8.9–10.3)
Chloride: 100 mmol/L (ref 98–111)
GFR calc Af Amer: 21 mL/min — ABNORMAL LOW (ref >60)
GFR calc non Af Amer: 18 mL/min — ABNORMAL LOW (ref >60)
GLUCOSE: 163 mg/dL — AB (ref 70–99)
Phosphorus: 2.4 mg/dL — ABNORMAL LOW (ref 2.5–4.6)
Potassium: 3.8 mmol/L (ref 3.5–5.1)
Sodium: 134 mmol/L — ABNORMAL LOW (ref 135–145)

## 2018-10-22 LAB — GLUCOSE, CAPILLARY
Glucose-Capillary: 163 mg/dL — ABNORMAL HIGH (ref 70–99)
Glucose-Capillary: 160 mg/dL — ABNORMAL HIGH (ref 70–99)
Glucose-Capillary: 304 mg/dL — ABNORMAL HIGH (ref 70–99)
Glucose-Capillary: 170 mg/dL — ABNORMAL HIGH (ref 70–99)

## 2018-10-22 MED ORDER — RENA-VITE PO TABS
1.0000 | ORAL_TABLET | Freq: Every day | ORAL | Status: DC
  Administered 2018-10-22: 1 via ORAL
  Filled 2018-10-22: qty 1

## 2018-10-22 MED ORDER — PRO-STAT SUGAR FREE PO LIQD
30.0000 mL | Freq: Two times a day (BID) | ORAL | Status: DC
  Administered 2018-10-22 – 2018-10-23 (×2): 30 mL via ORAL
  Filled 2018-10-22 (×2): qty 30

## 2018-10-22 MED ORDER — GERHARDT'S BUTT CREAM
TOPICAL_CREAM | CUTANEOUS | Status: DC | PRN
  Administered 2018-10-22 – 2018-10-23 (×2): via TOPICAL
  Filled 2018-10-22: qty 1

## 2018-10-22 MED ORDER — HYDROXYZINE HCL 10 MG/5ML PO SYRP
10.0000 mg | ORAL_SOLUTION | Freq: Two times a day (BID) | ORAL | Status: DC | PRN
  Administered 2018-10-22 – 2018-10-23 (×2): 10 mg via ORAL
  Filled 2018-10-22 (×3): qty 5

## 2018-10-22 NOTE — Progress Notes (Addendum)
SLP Cancellation Note  Patient Details Name: [name] MRN: [id number] DOB: 1946-06-15   Cancelled treatment:       Reason Eval/Treat Not Completed: Other (comment) Pt having her dressing changed. Will f/u as able.   Attempted to see pt again at 14:00, but she is starting to work with PT. Will continue efforts.   Venita Sheffield Supreme Rybarczyk 10/22/2018, 12:11 PM  Germain Osgood Lorissa Kishbaugh, M.A. [facility] education officer [phone] Office [phone]

## 2018-10-22 NOTE — Progress Notes (Signed)
Nutrition Follow Up  DOCUMENTATION CODES:   Not applicable  INTERVENTION:    Prostat liquid protein po 30 ml BID with meals, each supplement provides 100 kcal, 15 grams protein  NEW NUTRITION DIAGNOSIS:   Increased nutrient needs now related to chronic illness, wound healing as evidenced by estimated needs, ongoing  GOAL:   Patient will meet greater than or equal to 90% of their needs, progressing  MONITOR:   PO intake, Supplement acceptance, Labs, Skin, Weight trends, I & O's  ASSESSMENT:    73 y.o. Female with PMH significant of HTN, DM, COPD, anemia, atrial fib and DVT on Eliquis, CHF, ESRD-HD (TTS), lupus, PAD, s/p R AKA. She presented on 12/27 with L foot pain. Patient reported having L transmetatarsal amputation on 09/06/18. There was concern for gangrene in the foot. She underwent L AKA on 12/29. Subsequent course complicated by poor p.o. intake in the setting malnutrition, oral thrush and right flank pain.   1/03 transfer to ICU and intubated 1/04 OGT with bright red blood 1/13 extubated, TF via OGT  1/14 MBSS, SLP rec NPO, Cortrak placed 1/16 transferred from 2H-ICU to [id number]  Pt s/p bedside dysphagia tx per SLP 1/22. Diet advanced to Dys 2-honey thick liquids. Cortrak 10 F feeding tube removed.  PO intake has been good at 75% per flowsheets. Labs & medications reviewed. Na 134 (L). CBG's [phone].  Pt receiving hydrotherapy for large sacral wound per PT. ESRD on HD; Nephrology following; s/p HD yesterday. Dispo: LTACH likely as pt is not able to sit up for HD at this time.  Diet Order:   Diet Order            DIET DYS 2 Room service appropriate? Yes; Fluid consistency: Honey Thick  Diet effective now        Diet - low sodium heart healthy             EDUCATION NEEDS:   Not appropriate for education at this time  Skin:  Skin Assessment: Skin Integrity Issues: DTI: left thigh (1/6), left buttocks (1/6) Stage II: ischial tuberosity  (1/6) Stage III: sacrum (12/30) Incisions: L leg for AKA (12/29)  Last BM:  1/23   Intake/Output Summary (Last 24 hours) at 10/22/2018 1105 Last data filed at 10/21/2018 1700 Gross per 24 hour  Intake 720 ml  Output 3 ml  Net 717 ml   Height:   Ht Readings from Last 1 Encounters:  09/25/18 5' (1.524 m)   Weight:   Wt Readings from Last 1 Encounters:  10/21/18 53.5 kg   Ideal Body Weight:  45.45 kg  BMI:  26.8 kg/m2 >> adjusted for AKA  Estimated Nutritional Needs:   Kcal:  1700-1900  Protein:  95-110 gm  Fluid:  per MD  Arthur Holms, RD, LDN Pager #: [phone] After-Hours Pager #: [phone]

## 2018-10-22 NOTE — Progress Notes (Addendum)
Notified by Jonelle Sidle CCMD that pt had 3 min of SVT (HR 160's).  Assessed patient while provider Dr. Darrick Meigs, MD was in the room.  SVT resolved after 3 min and patient was asymptomatic.  Provider aware and will continue to monitor.

## 2018-10-22 NOTE — Progress Notes (Signed)
Physical Therapy Treatment Patient Details Name: [name] MRN: [id number] DOB: 1946-09-04 Today's Date: 10/22/2018    History of Present Illness [name] is a 73 y.o. female with medical history significant of hypertension, hyperlipidemia, diabetes mellitus, COPD, GERD, gout, and anemia, atrial fibrillation and DVT on Eliquis, ESRD-HD (TTS), lupus, PAD, R AKA (05/2018), now s/p L AKA. Pt with AMS and lethargy 10/01/18 transferred to ICU and intubated. Pt also with unstageable sacral injury    PT Comments    Pt tolerated 45 minutes in recliner. Pt incontinent of stool. Returned to bed with lift.    Follow Up Recommendations  LTACH     Equipment Recommendations  Other (comment)(defer)    Recommendations for Other Services       Precautions / Restrictions Precautions Precautions: Fall Precaution Comments: extensive wound on buttocks    Mobility  Bed Mobility Overal bed mobility: Needs Assistance Bed Mobility: Rolling Rolling: Max assist         General bed mobility comments: Assist to bring hips/shoulder over  Transfers Overall transfer level: Needs assistance               General transfer comment: Tolerated maximove using amputee sling.  Ambulation/Gait                 Stairs             Wheelchair Mobility    Modified Rankin (Stroke Patients Only)       Balance Overall balance assessment: Needs assistance Sitting-balance support: Bilateral upper extremity supported Sitting balance-Leahy Scale: Poor Sitting balance - Comments: Sitting in chair with lt lateral lean Postural control: Left lateral lean                                  Cognition Arousal/Alertness: Awake/alert Behavior During Therapy: WFL for tasks assessed/performed Overall Cognitive Status: Impaired/Different from baseline Area of Impairment: Following commands;Memory;Attention;Problem solving                     Memory:  Decreased short-term memory Following Commands: Follows one step commands with increased time     Problem Solving: Slow processing;Difficulty sequencing;Requires verbal cues        Exercises      General Comments        Pertinent Vitals/Pain Pain Assessment: Faces Faces Pain Scale: Hurts even more Pain Location: buttocks Pain Descriptors / Indicators: Grimacing;Moaning Pain Intervention(s): Monitored during session;Repositioned    Home Living                      Prior Function            PT Goals (current goals can now be found in the care plan section) Progress towards PT goals: Progressing toward goals    Frequency    Min 2X/week      PT Plan Current plan remains appropriate    Co-evaluation PT/OT/SLP Co-Evaluation/Treatment: Yes            AM-PAC PT "6 Clicks" Mobility   Outcome Measure  Help needed turning from your back to your side while in a flat bed without using bedrails?: Total Help needed moving from lying on your back to sitting on the side of a flat bed without using bedrails?: Total Help needed moving to and from a bed to a chair (including a wheelchair)?: Total Help needed standing up from a  chair using your arms (e.g., wheelchair or bedside chair)?: Total Help needed to walk in hospital room?: Total Help needed climbing 3-5 steps with a railing? : Total 6 Click Score: 6    End of Session   Activity Tolerance: Patient tolerated treatment well Patient left: with call bell/phone within reach;with family/visitor present;in chair Nurse Communication: Mobility status;Need for lift equipment(SWAT nursing assisted with transfer) PT Visit Diagnosis: Other abnormalities of gait and mobility (R26.89);Muscle weakness (generalized) (M62.81);Difficulty in walking, not elsewhere classified (R26.2);Pain Pain - Right/Left: Right Pain - part of body: Leg(buttocks)     Time: 1455-1510 PT Time Calculation (min) (ACUTE ONLY): 15  min  Charges:  $Therapeutic Activity: 8-22 mins                     [place] Pager [phone] Office [place] 10/22/2018, 4:12 PM

## 2018-10-22 NOTE — Progress Notes (Addendum)
Subjective:  Co itching all over( will start prn atarax bid)  , said tolerated HD yest, now eating  Lunch and appearing to tolerate   Objective Vital signs in last 24 hours: Vitals:   10/21/18 1500 10/21/18 1700 10/21/18 2101 10/22/18 0432  BP:   (!) 125/35 (!) 143/39  Pulse: 97 (!) 103 94 96  Resp: (!) 26 (!) 23 14 (!) 30  Temp:   97.7 F (36.5 C) 98.2 F (36.8 C)  TempSrc:   Oral Oral  SpO2: 99% 99% 99% 98%  Weight:      Height:       Weight change:   Physical Exam: General: Chronically ill  AAF, NAD  Heart: RRR Lungs: Grossly clear  Abdomen: BS pos . Soft , Nt, ND Extremities: no Lower extrem. edema with BILAT AKA  Dialysis Access: R IJ Perm cath   OP Dialysis Orders:  [facility] TTS  3.5h 61kg 400/800 Hep 1800 R TDC Mircera 225 q2weeks (last 09/14/18)  Summary Pt is a 73 y.o. yo female ESRD who was admitted on 09/24/2018 with FTT after AKA culminating with PNA and respiratory failure req vent  Problem/Plan: 1.  ESRD - HD on TTS 2. Pulm issues = PNA and Resp Failure resolved on;luy liter uf needed yesterday with hd 3.  HTN/volume - BP stable ,no bp meds , well under edw  Wt 53.5 yest , with aka and sepsis  , but appetite  improving  4. C. Diff = no more diarrhea  Reported this am with rectal tube removed  5. Sacral Decub= Getting [employer]  For Large /deep ulcer  6. L AKA = this admit  Now bilat aka 7. Anemia - hgb 9.0  Max Aranesp q sat /no iron for now with infxn issue  8. Secondary hyperparathyroidism - phos has  been low  2.4 today with no Phoslyra binder  Ca corrected 9.8 (has ice cream on tray this am  and tolerating most of diet  K 3.8 monitor labs / check pth  Next  Hd  9. FTT- low albumin/decub that is large - has had palliative discussion but family requesting continued max efforts, cont with these discussions 10. Renal mass- not of clinical significance- would not chase down. 11. Dispo - appreciate case management assist- she does need to be able to sit up  for OP HD which she is not strong enough to do right now. Will need LTACH most likely  Ernest Haber, PA-C [place] [phone] 10/22/2018,12:11 PM  LOS: 28 days   Pt seen, examined and agree w A/P as above.  Kelly Splinter MD [facility] pager [phone]   10/22/2018, 3:51 PM  Labs: Basic Metabolic Panel: Recent Labs  Lab 10/20/18 0306 10/21/18 0312 10/22/18 0318  NA 136 134* 134*  K 3.7 3.9 3.8  CL 102 100 100  CO2 23 21* 24  GLUCOSE 169* 127* 163*  BUN 22 49* 31*  CREATININE 1.84* 3.24* 2.51*  CALCIUM 8.3* 8.5* 8.1*  PHOS 2.2* 3.4 2.4*   Liver Function Tests: Recent Labs  Lab 10/20/18 0306 10/21/18 0312 10/22/18 0318  ALBUMIN 1.7* 1.8* 1.7*   No results for input(s): LIPASE, AMYLASE in the last 168 hours. No results for input(s): AMMONIA in the last 168 hours. CBC: Recent Labs  Lab 10/16/18 0325 10/17/18 0228 10/18/18 0244 10/19/18 0330 10/20/18 0306  WBC 16.7* 14.1* 14.1* 12.0* 10.6*  HGB 9.5* 9.3* 9.7* 9.7* 9.0*  HCT 31.3* 31.7* 33.6* 33.0* 31.7*  MCV 93.7 92.7 95.2 95.1 96.6  PLT 208 172 193 186 159   Cardiac Enzymes: No results for input(s): CKTOTAL, CKMB, CKMBINDEX, TROPONINI in the last 168 hours. CBG: Recent Labs  Lab 10/21/18 1120 10/21/18 1606 10/21/18 2101 10/22/18 0638 10/22/18 1140  GLUCAP 120* 284* 311* 163* 160*    Studies/Results: No results found. Medications: . sodium chloride Stopped (10/11/18 0902)   . acetaminophen  650 mg Oral Q6H  . amiodarone  200 mg Per Tube Daily  . apixaban  2.5 mg Oral BID  . brimonidine  1 drop Left Eye BID   And  . timolol  1 drop Left Eye BID  . Chlorhexidine Gluconate Cloth  6 each Topical Q0600  . Chlorhexidine Gluconate Cloth  6 each Topical Q0600  . darbepoetin (ARANESP) injection - DIALYSIS  200 mcg Intravenous Q Sat-HD  . feeding supplement (PRO-STAT SUGAR FREE 64)  30 mL Oral BID  . insulin aspart  0-9 Units Subcutaneous TID WC  . latanoprost   1 drop Both Eyes QHS  . pantoprazole  40 mg Oral BID  . predniSONE  10 mg Oral Q breakfast  . sodium chloride flush  3 mL Intravenous Q12H  . sucralfate  1 g Oral TID WC & HS  . vancomycin  125 mg Oral Q6H

## 2018-10-22 NOTE — Progress Notes (Signed)
Physical Therapy Wound Treatment Patient Details  Name: [name] MRN: [id number] Date of Birth: [birth year]  Today's Date: 10/22/2018 Time: 2778-2423 Time Calculation (min): 56 min  Subjective  Subjective: "Please don't hurt me." Date of Onset: (unknown) Prior Treatments: dressing change  Pain Score: Pt moaning with buttocks pain before and during hydrotherapy; Pre medicated and monitored during session  Wound Assessment  Pressure Injury 09/27/18 Unstageable - Full thickness tissue loss in which the base of the ulcer is covered by slough (yellow, tan, gray, green or brown) and/or eschar (tan, brown or black) in the wound bed. (Active)  Wound Image   10/22/2018 11:36 AM  Dressing Type ABD;Barrier Film (skin prep);Tape dressing;Other (Comment);Gauze (Comment) 10/22/2018 11:36 AM  Dressing Intact 10/22/2018 11:36 AM  Dressing Change Frequency Daily 10/22/2018 11:36 AM  State of Healing Eschar 10/22/2018 11:36 AM  Site / Wound Assessment Black;Red;Yellow 10/22/2018 11:36 AM  % Wound base Red or Granulating 10% 10/22/2018 11:36 AM  % Wound base Yellow/Fibrinous Exudate 80% 10/22/2018 11:36 AM  % Wound base Black/Eschar 10% 10/22/2018 11:36 AM  % Wound base Other/Granulation Tissue (Comment) 0% 10/22/2018 11:36 AM  Peri-wound Assessment Erythema (blanchable) 10/22/2018 11:36 AM  Wound Length (cm) 12 cm 10/22/2018 11:36 AM  Wound Width (cm) 10 cm 10/22/2018 11:36 AM  Wound Depth (cm) 0.1 cm 10/22/2018 11:36 AM  Wound Surface Area (cm^2) 120 cm^2 10/22/2018 11:36 AM  Wound Volume (cm^3) 12 cm^3 10/22/2018 11:36 AM  Tunneling (cm) 0 10/06/2018  8:00 PM  Undermining (cm) 0 10/06/2018  8:00 PM  Margins Unattached edges (unapproximated) 10/22/2018 11:36 AM  Drainage Amount Moderate 10/22/2018 11:36 AM  Drainage Description Sanguineous 10/22/2018 11:36 AM  Treatment Debridement (Selective);Hydrotherapy (Pulse lavage) 10/22/2018 11:36 AM      Hydrotherapy Pulsed lavage therapy - wound location:  sacrum Pulsed Lavage with Suction (psi): 12 psi(8-12) Pulsed Lavage with Suction - Normal Saline Used: 1000 mL Pulsed Lavage Tip: Tip with splash shield Selective Debridement Selective Debridement - Location: sacrum Selective Debridement - Tools Used: Forceps;Scalpel Selective Debridement - Tissue Removed: yellow necrotic tissue   Wound Assessment and Plan  Wound Therapy - Assess/Plan/Recommendations Wound Therapy - Clinical Statement: Pt with increase in surface area of sacral wound, which is expected in typical progression of wound healing. Continued removal of slough. No green/blue drainage noted this session since initiation of Aquacel Ag. Wound Therapy - Functional Problem List: decreased mobility, decreased sitting balance Factors Delaying/Impairing Wound Healing: Diabetes Mellitus;Immobility;Multiple medical problems Hydrotherapy Plan: Debridement;Dressing change;Patient/family education;Pulsatile lavage with suction Wound Therapy - Frequency: 3X / week(MWF) Wound Therapy - Follow Up Recommendations: Other (comment)(LTACH) Wound Plan: see above  Wound Therapy Goals- Improve the function of patient's integumentary system by progressing the wound(s) through the phases of wound healing (inflammation - proliferation - remodeling) by: Decrease Necrotic Tissue to: 70 Decrease Necrotic Tissue - Progress: Goal set today Increase Granulation Tissue to: 30 Increase Granulation Tissue - Progress: Goal set today Goals/treatment plan/discharge plan were made with and agreed upon by patient/family: Yes Time For Goal Achievement: 7 days Wound Therapy - Potential for Goals: Fair  Goals will be updated until maximal potential achieved or discharge criteria met.  Discharge criteria: when goals achieved, discharge from hospital, MD decision/surgical intervention, no progress towards goals, refusal/missing three consecutive treatments without notification or medical reason.  GP     Ellamae Sia, PT, DPT Acute Rehabilitation Services Pager [phone] Office [phone]  Willy Eddy 10/22/2018, 12:00 PM

## 2018-10-22 NOTE — Progress Notes (Signed)
Physical Therapy Treatment Patient Details Name: [name] MRN: [id number] DOB: 07-29-1946 Today's Date: 10/22/2018    History of Present Illness [name] is a 73 y.o. female with medical history significant of hypertension, hyperlipidemia, diabetes mellitus, COPD, GERD, gout, and anemia, atrial fibrillation and DVT on Eliquis, ESRD-HD (TTS), lupus, PAD, R AKA (05/2018), now s/p L AKA. Pt with AMS and lethargy 10/01/18 transferred to ICU and intubated. Pt also with unstageable sacral injury    PT Comments    Pt able to tolerate transfer to recliner with maximove and amputee pad. Cushion in chair. Will follow up shortly for tolerance to sitting.  Follow Up Recommendations  LTACH     Equipment Recommendations  Other (comment)(defer)    Recommendations for Other Services       Precautions / Restrictions Precautions Precautions: Fall Precaution Comments: extensive wound on buttocks    Mobility  Bed Mobility Overal bed mobility: Needs Assistance Bed Mobility: Rolling Rolling: Max assist         General bed mobility comments: Assist to bring hips/shoulder over  Transfers Overall transfer level: Needs assistance               General transfer comment: Tolerated maximove using amputee sling.  Ambulation/Gait                 Stairs             Wheelchair Mobility    Modified Rankin (Stroke Patients Only)       Balance Overall balance assessment: Needs assistance Sitting-balance support: Bilateral upper extremity supported Sitting balance-Leahy Scale: Poor Sitting balance - Comments: Sitting in chair with rt lateral lean Postural control: Right lateral lean                                  Cognition Arousal/Alertness: Awake/alert Behavior During Therapy: WFL for tasks assessed/performed Overall Cognitive Status: Impaired/Different from baseline Area of Impairment: Following commands;Memory;Attention;Problem  solving                     Memory: Decreased short-term memory Following Commands: Follows one step commands with increased time     Problem Solving: Slow processing;Difficulty sequencing;Requires verbal cues        Exercises      General Comments        Pertinent Vitals/Pain Pain Assessment: Faces Faces Pain Scale: Hurts even more Pain Location: buttocks Pain Descriptors / Indicators: Grimacing;Moaning Pain Intervention(s): Monitored during session;Limited activity within patient's tolerance    Home Living                      Prior Function            PT Goals (current goals can now be found in the care plan section) Progress towards PT goals: Progressing toward goals    Frequency    Min 2X/week      PT Plan Current plan remains appropriate    Co-evaluation PT/OT/SLP Co-Evaluation/Treatment: Yes            AM-PAC PT "6 Clicks" Mobility   Outcome Measure  Help needed turning from your back to your side while in a flat bed without using bedrails?: Total Help needed moving from lying on your back to sitting on the side of a flat bed without using bedrails?: Total Help needed moving to and from a bed to a chair (  including a wheelchair)?: Total Help needed standing up from a chair using your arms (e.g., wheelchair or bedside chair)?: Total Help needed to walk in hospital room?: Total Help needed climbing 3-5 steps with a railing? : Total 6 Click Score: 6    End of Session   Activity Tolerance: Patient tolerated treatment well Patient left: with call bell/phone within reach;with family/visitor present;in chair Nurse Communication: Mobility status;Need for lift equipment(nursing assisted with transfer) PT Visit Diagnosis: Other abnormalities of gait and mobility (R26.89);Muscle weakness (generalized) (M62.81);Difficulty in walking, not elsewhere classified (R26.2);Pain Pain - Right/Left: Right Pain - part of body: Leg(buttocks)      Time: 1898-4210 PT Time Calculation (min) (ACUTE ONLY): 18 min  Charges:  $Therapeutic Activity: 8-22 mins                     [place] Pager [phone] Office [place] 10/22/2018, 2:31 PM

## 2018-10-22 NOTE — Progress Notes (Addendum)
[facility]  PROGRESS NOTE  [name] KKX:[id number] DOB: 11-15-1945 DOA: 09/24/2018 PCP: Sharilyn Sites, MD   Brief HPI:    7 yr yr old female with h/o hypertension, dyslipidemia, type 2 diabetes mellitus, COPD, GERD, gout, anemia, atrial fibrillation, DVT, ESRD TTS, peripheral artery disease s/p right AKA, lupus, CHF. Patient had transmetatarsal amputation Sep 06, 2018, developed worsening left foot pain associated with drainage and fever. She was seen at the vascular surgery office and was diagnosed with gangrene of left foot and sent to the hospital for further evaluation. On Dec 29, she underwent left AKA , she developed respiratory failure, worsening encephalopathy and required invasive mechanical ventilation. She remained on rhe ventilator for 10 days from 10/01/18 -- 10/11/18. Her hospital stay has been complicated by septic shock and acute hypoxic respiratory failure due to ESBL E coli, pneumonia, C difficile colitis, and large decubitus ulcer.    Subjective   Patient seen and examined, complains of back pain due to her posture in the bed.  Reporting   Assessment/Plan:     1. Septic shock-resolved, secondary to E. coli pneumonia, she has completed 8 days of antibiotic therapy with meropenem.  2. Left limb ischemia, status post AKA-continue pain management, postop care.  Vascular surgery is following.  Patient was noted to have necrotic lateral stump, she will need revision of the stump as per surgery.  Continue dressing changes as ordered.  3. C. difficile infection-continue vancomycin 125 mg p.o. every 6 hours, rectal tube in place.Stop date 10/26/2018, treatment for total 10 days.  4. Dysphagia-patient had failed swallow evaluation after she was transferred from ICU.  She was started on nasogastric tube for nutrition.  NG tube has been discontinued and patient started on dysphagia 2 diet.  5. Atrial fibrillation-chronic, continue amiodarone, metoprolol.  Continue  anticoagulation with Eliquis.  6. Type 2 diabetes mellitus-continue sliding scale insulin with NovoLog.  CBG well controlled.  7. Hypertension-blood pressures well controlled.  8. ESRD on hemodialysis-continue hemodialysis Tuesday, Thursday, Saturday.  She does have a right IJ tunnel catheter in place.  9. Systemic lupus erythematosus-continue prednisone, Plaquenil.  10. Acute on chronic anemia-secondary to upper GI bleed from NG tube trauma.  She required 1 unit of PRBC.  Anticoagulation was held which has been restarted.         CBG: Recent Labs  Lab 10/21/18 1120 10/21/18 1606 10/21/18 2101 10/22/18 0638 10/22/18 1140  GLUCAP 120* 284* 311* 163* 160*    CBC: Recent Labs  Lab 10/16/18 0325 10/17/18 0228 10/18/18 0244 10/19/18 0330 10/20/18 0306  WBC 16.7* 14.1* 14.1* 12.0* 10.6*  HGB 9.5* 9.3* 9.7* 9.7* 9.0*  HCT 31.3* 31.7* 33.6* 33.0* 31.7*  MCV 93.7 92.7 95.2 95.1 96.6  PLT 208 172 193 186 169    Basic Metabolic Panel: Recent Labs  Lab 10/18/18 0244 10/19/18 0330 10/20/18 0306 10/21/18 0312 10/22/18 0318  NA 137 133* 136 134* 134*  K 3.9 4.0 3.7 3.9 3.8  CL 102 101 102 100 100  CO2 22 18* 23 21* 24  GLUCOSE 108* 111* 169* 127* 163*  BUN 37* 58* 22 49* 31*  CREATININE 2.52* 3.43* 1.84* 3.24* 2.51*  CALCIUM 8.5* 8.9 8.3* 8.5* 8.1*  PHOS 3.7 4.8* 2.2* 3.4 2.4*     DVT prophylaxis: Apixaban  Code Status: Full code  Family Communication: No family at bedside  Disposition Plan: LTAC, Patient is not strong enough to set up for outpatient hemodialysis.   Consultants:  Vascular surgery  Procedures:  Left AKA   Antibiotics:   Anti-infectives (From admission, onward)   Start     Dose/Rate Route Frequency Ordered Stop   10/19/18 1200  vancomycin (VANCOCIN) 50 mg/mL oral solution 125 mg     125 mg Oral Every 6 hours 10/19/18 1022     10/19/18 0000  vancomycin (VANCOCIN) 50 mg/mL oral solution     125 mg Oral Every 6 hours 10/19/18 1028  10/29/18 2359   10/12/18 0930  metroNIDAZOLE (FLAGYL) IVPB 500 mg  Status:  Discontinued     500 mg 100 mL/hr over 60 Minutes Intravenous Every 8 hours 10/12/18 0919 10/12/18 1437   10/07/18 1130  vancomycin (VANCOCIN) 50 mg/mL oral solution 125 mg     125 mg Per Tube 4 times daily 10/07/18 1055 10/17/18 2359   10/05/18 1600  meropenem (MERREM) 500 mg in sodium chloride 0.9 % 100 mL IVPB     500 mg 200 mL/hr over 30 Minutes Intravenous Every 24 hours 10/05/18 1331 10/11/18 1633   10/03/18 1400  meropenem (MERREM) 500 mg in sodium chloride 0.9 % 100 mL IVPB  Status:  Discontinued     500 mg 200 mL/hr over 30 Minutes Intravenous Every 24 hours 10/03/18 1330 10/05/18 1331   10/02/18 1800  ceFEPIme (MAXIPIME) 2 g in sodium chloride 0.9 % 100 mL IVPB  Status:  Discontinued     2 g 200 mL/hr over 30 Minutes Intravenous Every T-Th-Sa (Hemodialysis) 10/01/18 1341 10/03/18 1322   10/02/18 1800  fluconazole (DIFLUCAN) IVPB 100 mg  Status:  Discontinued     100 mg 50 mL/hr over 60 Minutes Intravenous Every T-Th-Sa (Hemodialysis) 10/01/18 1532 10/03/18 1322   10/02/18 1200  vancomycin (VANCOCIN) 500 mg in sodium chloride 0.9 % 100 mL IVPB  Status:  Discontinued     500 mg 100 mL/hr over 60 Minutes Intravenous Every T-Th-Sa (Hemodialysis) 10/01/18 1341 10/04/18 1524   10/01/18 1400  metroNIDAZOLE (FLAGYL) IVPB 500 mg  Status:  Discontinued     500 mg 100 mL/hr over 60 Minutes Intravenous Every 8 hours 10/01/18 1238 10/03/18 1322   10/01/18 1300  ceFEPIme (MAXIPIME) 2 g in sodium chloride 0.9 % 100 mL IVPB     2 g 200 mL/hr over 30 Minutes Intravenous  Once 10/01/18 1238 10/01/18 1626   10/01/18 1300  vancomycin (VANCOCIN) IVPB 1000 mg/200 mL premix     1,000 mg 200 mL/hr over 60 Minutes Intravenous  Once 10/01/18 1238 10/01/18 1541   10/01/18 0000  fluconazole (DIFLUCAN) tablet 100 mg  Status:  Discontinued     100 mg Oral Once per day on Tue Thu Sat 09/30/18 1440 10/01/18 1517   09/30/18 1530   fluconazole (DIFLUCAN) tablet 200 mg     200 mg Oral  Once 09/30/18 1440 09/30/18 1709   09/25/18 1200  vancomycin (VANCOCIN) IVPB 750 mg/150 ml premix  Status:  Discontinued     750 mg 150 mL/hr over 60 Minutes Intravenous Every T-Th-Sa (Hemodialysis) 09/24/18 2116 09/27/18 1104   09/25/18 1000  hydroxychloroquine (PLAQUENIL) tablet 200 mg  Status:  Discontinued     200 mg Oral Daily 09/25/18 0033 10/01/18 1517   09/24/18 2030  cefTRIAXone (ROCEPHIN) 1 g in sodium chloride 0.9 % 100 mL IVPB  Status:  Discontinued     1 g 200 mL/hr over 30 Minutes Intravenous Every 24 hours 09/24/18 2022 09/27/18 1104   09/24/18 2030  vancomycin (VANCOCIN) IVPB 1000 mg/200 mL premix     1,000 mg 200  mL/hr over 60 Minutes Intravenous  Once 09/24/18 2030 09/25/18 0408   09/24/18 1815  piperacillin-tazobactam (ZOSYN) IVPB 3.375 g     3.375 g 100 mL/hr over 30 Minutes Intravenous  Once 09/24/18 1803 09/24/18 1933       Objective   Vitals:   10/21/18 1500 10/21/18 1700 10/21/18 2101 10/22/18 0432  BP:   (!) 125/35 (!) 143/39  Pulse: 97 (!) 103 94 96  Resp: (!) 26 (!) 23 14 (!) 30  Temp:   97.7 F (36.5 C) 98.2 F (36.8 C)  TempSrc:   Oral Oral  SpO2: 99% 99% 99% 98%  Weight:      Height:        Intake/Output Summary (Last 24 hours) at 10/22/2018 1354 Last data filed at 10/21/2018 1700 Gross per 24 hour  Intake 480 ml  Output 3 ml  Net 477 ml   Filed Weights   10/20/18 0401 10/21/18 0715 10/21/18 1052  Weight: 52.6 kg 54 kg 53.5 kg     Physical Examination:   General: Appears in no acute distress  Cardiovascular: S1-S2, regular, no murmur auscultated  Respiratory: Clear to auscultation bilaterally.  No wheezing or crackles.  Abdomen: Soft, nontender, no organomegaly  Extremities: Bilateral AKA  Neurologic: Alert, oriented x3, no focal deficit.     Data Reviewed: I have personally reviewed following labs and imaging studies   No results found for this or any previous visit  (from the past 240 hour(s)).   Liver Function Tests: Recent Labs  Lab 10/18/18 0244 10/19/18 0330 10/20/18 0306 10/21/18 0312 10/22/18 0318  ALBUMIN 1.9* 1.9* 1.7* 1.8* 1.7*   No results for input(s): LIPASE, AMYLASE in the last 168 hours. No results for input(s): AMMONIA in the last 168 hours.  Cardiac Enzymes: No results for input(s): CKTOTAL, CKMB, CKMBINDEX, TROPONINI in the last 168 hours. BNP (last 3 results) Recent Labs    11/03/17 0801  BNP 3,320.0*    ProBNP (last 3 results) No results for input(s): PROBNP in the last 8760 hours.    Studies: No results found.  Scheduled Meds: . acetaminophen  650 mg Oral Q6H  . amiodarone  200 mg Per Tube Daily  . apixaban  2.5 mg Oral BID  . brimonidine  1 drop Left Eye BID   And  . timolol  1 drop Left Eye BID  . Chlorhexidine Gluconate Cloth  6 each Topical Q0600  . Chlorhexidine Gluconate Cloth  6 each Topical Q0600  . darbepoetin (ARANESP) injection - DIALYSIS  200 mcg Intravenous Q Sat-HD  . feeding supplement (PRO-STAT SUGAR FREE 64)  30 mL Oral BID  . insulin aspart  0-9 Units Subcutaneous TID WC  . latanoprost  1 drop Both Eyes QHS  . multivitamin  1 tablet Oral QHS  . pantoprazole  40 mg Oral BID  . predniSONE  10 mg Oral Q breakfast  . sodium chloride flush  3 mL Intravenous Q12H  . sucralfate  1 g Oral TID WC & HS  . vancomycin  125 mg Oral Q6H    Admission status: Inpatient: Based on patients clinical presentation and evaluation of above clinical data, I have made determination that patient meets Inpatient criteria at this time.  Time spent: 25 min  [facility] Pager [phone]. If 7PM-7AM, please contact night-coverage at [email or web address], Office  [phone]  password [email or web address]  10/22/2018, 1:54 PM  LOS: 28 days

## 2018-10-22 NOTE — Progress Notes (Signed)
Patient ID: [name], female   DOB: 24-Feb-1946, 73 y.o.   MRN: [id number] Progressive tissue loss AKA incision.  Severe protein calorie malnutrition.  Now with entire length of the suture line with necrotic skin edges.  We will continue to follow.  Would need surgical debridement in the future.  Question her ability to heal.  She does have a palpable left femoral pulse and continued nonhealing.

## 2018-10-23 DIAGNOSIS — J9601 Acute respiratory failure with hypoxia: Secondary | ICD-10-CM | POA: Diagnosis not present

## 2018-10-23 DIAGNOSIS — B3781 Candidal esophagitis: Secondary | ICD-10-CM | POA: Diagnosis not present

## 2018-10-23 DIAGNOSIS — L089 Local infection of the skin and subcutaneous tissue, unspecified: Secondary | ICD-10-CM | POA: Diagnosis not present

## 2018-10-23 DIAGNOSIS — D72829 Elevated white blood cell count, unspecified: Secondary | ICD-10-CM | POA: Diagnosis not present

## 2018-10-23 DIAGNOSIS — I509 Heart failure, unspecified: Secondary | ICD-10-CM | POA: Diagnosis not present

## 2018-10-23 DIAGNOSIS — N2581 Secondary hyperparathyroidism of renal origin: Secondary | ICD-10-CM | POA: Diagnosis not present

## 2018-10-23 DIAGNOSIS — T8142XA Infection following a procedure, deep incisional surgical site, initial encounter: Secondary | ICD-10-CM | POA: Diagnosis not present

## 2018-10-23 DIAGNOSIS — Z89612 Acquired absence of left leg above knee: Secondary | ICD-10-CM | POA: Diagnosis not present

## 2018-10-23 DIAGNOSIS — I4891 Unspecified atrial fibrillation: Secondary | ICD-10-CM | POA: Diagnosis not present

## 2018-10-23 DIAGNOSIS — L89210 Pressure ulcer of right hip, unstageable: Secondary | ICD-10-CM | POA: Diagnosis not present

## 2018-10-23 DIAGNOSIS — T8744 Infection of amputation stump, left lower extremity: Secondary | ICD-10-CM | POA: Diagnosis not present

## 2018-10-23 DIAGNOSIS — L89153 Pressure ulcer of sacral region, stage 3: Secondary | ICD-10-CM | POA: Diagnosis not present

## 2018-10-23 DIAGNOSIS — E1122 Type 2 diabetes mellitus with diabetic chronic kidney disease: Secondary | ICD-10-CM | POA: Diagnosis not present

## 2018-10-23 DIAGNOSIS — T8189XA Other complications of procedures, not elsewhere classified, initial encounter: Secondary | ICD-10-CM | POA: Diagnosis not present

## 2018-10-23 DIAGNOSIS — N179 Acute kidney failure, unspecified: Secondary | ICD-10-CM | POA: Diagnosis not present

## 2018-10-23 DIAGNOSIS — K219 Gastro-esophageal reflux disease without esophagitis: Secondary | ICD-10-CM | POA: Diagnosis not present

## 2018-10-23 DIAGNOSIS — R000 Tachycardia, unspecified: Secondary | ICD-10-CM | POA: Diagnosis not present

## 2018-10-23 DIAGNOSIS — Z1612 Extended spectrum beta lactamase (ESBL) resistance: Secondary | ICD-10-CM | POA: Diagnosis not present

## 2018-10-23 DIAGNOSIS — I471 Supraventricular tachycardia: Secondary | ICD-10-CM | POA: Diagnosis not present

## 2018-10-23 DIAGNOSIS — E1152 Type 2 diabetes mellitus with diabetic peripheral angiopathy with gangrene: Secondary | ICD-10-CM | POA: Diagnosis not present

## 2018-10-23 DIAGNOSIS — I120 Hypertensive chronic kidney disease with stage 5 chronic kidney disease or end stage renal disease: Secondary | ICD-10-CM | POA: Diagnosis not present

## 2018-10-23 DIAGNOSIS — R2689 Other abnormalities of gait and mobility: Secondary | ICD-10-CM | POA: Diagnosis not present

## 2018-10-23 DIAGNOSIS — G8922 Chronic post-thoracotomy pain: Secondary | ICD-10-CM | POA: Diagnosis not present

## 2018-10-23 DIAGNOSIS — T8130XA Disruption of wound, unspecified, initial encounter: Secondary | ICD-10-CM | POA: Diagnosis not present

## 2018-10-23 DIAGNOSIS — E1169 Type 2 diabetes mellitus with other specified complication: Secondary | ICD-10-CM | POA: Diagnosis not present

## 2018-10-23 DIAGNOSIS — Y92239 Unspecified place in hospital as the place of occurrence of the external cause: Secondary | ICD-10-CM | POA: Diagnosis not present

## 2018-10-23 DIAGNOSIS — L03116 Cellulitis of left lower limb: Secondary | ICD-10-CM | POA: Diagnosis not present

## 2018-10-23 DIAGNOSIS — Y95 Nosocomial condition: Secondary | ICD-10-CM | POA: Diagnosis not present

## 2018-10-23 DIAGNOSIS — J9600 Acute respiratory failure, unspecified whether with hypoxia or hypercapnia: Secondary | ICD-10-CM | POA: Diagnosis not present

## 2018-10-23 DIAGNOSIS — A0472 Enterocolitis due to Clostridium difficile, not specified as recurrent: Secondary | ICD-10-CM | POA: Diagnosis not present

## 2018-10-23 DIAGNOSIS — R1312 Dysphagia, oropharyngeal phase: Secondary | ICD-10-CM | POA: Diagnosis not present

## 2018-10-23 DIAGNOSIS — Y828 Other medical devices associated with adverse incidents: Secondary | ICD-10-CM | POA: Diagnosis not present

## 2018-10-23 DIAGNOSIS — K922 Gastrointestinal hemorrhage, unspecified: Secondary | ICD-10-CM | POA: Diagnosis not present

## 2018-10-23 DIAGNOSIS — M329 Systemic lupus erythematosus, unspecified: Secondary | ICD-10-CM | POA: Diagnosis not present

## 2018-10-23 DIAGNOSIS — T8789 Other complications of amputation stump: Secondary | ICD-10-CM | POA: Diagnosis not present

## 2018-10-23 DIAGNOSIS — Z992 Dependence on renal dialysis: Secondary | ICD-10-CM | POA: Diagnosis not present

## 2018-10-23 DIAGNOSIS — I4819 Other persistent atrial fibrillation: Secondary | ICD-10-CM | POA: Diagnosis not present

## 2018-10-23 DIAGNOSIS — A419 Sepsis, unspecified organism: Secondary | ICD-10-CM | POA: Diagnosis not present

## 2018-10-23 DIAGNOSIS — M79606 Pain in leg, unspecified: Secondary | ICD-10-CM | POA: Diagnosis not present

## 2018-10-23 DIAGNOSIS — A4151 Sepsis due to Escherichia coli [E. coli]: Secondary | ICD-10-CM | POA: Diagnosis not present

## 2018-10-23 DIAGNOSIS — L89154 Pressure ulcer of sacral region, stage 4: Secondary | ICD-10-CM | POA: Diagnosis not present

## 2018-10-23 DIAGNOSIS — D62 Acute posthemorrhagic anemia: Secondary | ICD-10-CM | POA: Diagnosis not present

## 2018-10-23 DIAGNOSIS — E440 Moderate protein-calorie malnutrition: Secondary | ICD-10-CM | POA: Diagnosis not present

## 2018-10-23 DIAGNOSIS — R1310 Dysphagia, unspecified: Secondary | ICD-10-CM | POA: Diagnosis not present

## 2018-10-23 DIAGNOSIS — N186 End stage renal disease: Secondary | ICD-10-CM | POA: Diagnosis not present

## 2018-10-23 DIAGNOSIS — E113399 Type 2 diabetes mellitus with moderate nonproliferative diabetic retinopathy without macular edema, unspecified eye: Secondary | ICD-10-CM | POA: Diagnosis not present

## 2018-10-23 DIAGNOSIS — J849 Interstitial pulmonary disease, unspecified: Secondary | ICD-10-CM | POA: Diagnosis not present

## 2018-10-23 DIAGNOSIS — R109 Unspecified abdominal pain: Secondary | ICD-10-CM | POA: Diagnosis not present

## 2018-10-23 DIAGNOSIS — L89159 Pressure ulcer of sacral region, unspecified stage: Secondary | ICD-10-CM | POA: Diagnosis not present

## 2018-10-23 DIAGNOSIS — R627 Adult failure to thrive: Secondary | ICD-10-CM | POA: Diagnosis not present

## 2018-10-23 DIAGNOSIS — D649 Anemia, unspecified: Secondary | ICD-10-CM | POA: Diagnosis not present

## 2018-10-23 DIAGNOSIS — J449 Chronic obstructive pulmonary disease, unspecified: Secondary | ICD-10-CM | POA: Diagnosis not present

## 2018-10-23 DIAGNOSIS — I5033 Acute on chronic diastolic (congestive) heart failure: Secondary | ICD-10-CM | POA: Diagnosis not present

## 2018-10-23 DIAGNOSIS — I110 Hypertensive heart disease with heart failure: Secondary | ICD-10-CM | POA: Diagnosis not present

## 2018-10-23 DIAGNOSIS — R5381 Other malaise: Secondary | ICD-10-CM | POA: Diagnosis not present

## 2018-10-23 DIAGNOSIS — L89150 Pressure ulcer of sacral region, unstageable: Secondary | ICD-10-CM | POA: Diagnosis not present

## 2018-10-23 DIAGNOSIS — T8754 Necrosis of amputation stump, left lower extremity: Secondary | ICD-10-CM | POA: Diagnosis not present

## 2018-10-23 DIAGNOSIS — G894 Chronic pain syndrome: Secondary | ICD-10-CM | POA: Diagnosis not present

## 2018-10-23 DIAGNOSIS — J155 Pneumonia due to Escherichia coli: Secondary | ICD-10-CM | POA: Diagnosis not present

## 2018-10-23 DIAGNOSIS — L89223 Pressure ulcer of left hip, stage 3: Secondary | ICD-10-CM | POA: Diagnosis not present

## 2018-10-23 DIAGNOSIS — Y835 Amputation of limb(s) as the cause of abnormal reaction of the patient, or of later complication, without mention of misadventure at the time of the procedure: Secondary | ICD-10-CM | POA: Diagnosis not present

## 2018-10-23 DIAGNOSIS — R0989 Other specified symptoms and signs involving the circulatory and respiratory systems: Secondary | ICD-10-CM | POA: Diagnosis not present

## 2018-10-23 DIAGNOSIS — E1142 Type 2 diabetes mellitus with diabetic polyneuropathy: Secondary | ICD-10-CM | POA: Diagnosis not present

## 2018-10-23 DIAGNOSIS — I70262 Atherosclerosis of native arteries of extremities with gangrene, left leg: Secondary | ICD-10-CM | POA: Diagnosis not present

## 2018-10-23 DIAGNOSIS — B370 Candidal stomatitis: Secondary | ICD-10-CM | POA: Diagnosis not present

## 2018-10-23 DIAGNOSIS — G8912 Acute post-thoracotomy pain: Secondary | ICD-10-CM | POA: Diagnosis not present

## 2018-10-23 DIAGNOSIS — M868X6 Other osteomyelitis, lower leg: Secondary | ICD-10-CM | POA: Diagnosis not present

## 2018-10-23 DIAGNOSIS — R6521 Severe sepsis with septic shock: Secondary | ICD-10-CM | POA: Diagnosis not present

## 2018-10-23 DIAGNOSIS — I4892 Unspecified atrial flutter: Secondary | ICD-10-CM | POA: Diagnosis not present

## 2018-10-23 DIAGNOSIS — I96 Gangrene, not elsewhere classified: Secondary | ICD-10-CM | POA: Diagnosis not present

## 2018-10-23 DIAGNOSIS — I5032 Chronic diastolic (congestive) heart failure: Secondary | ICD-10-CM | POA: Diagnosis not present

## 2018-10-23 DIAGNOSIS — G9341 Metabolic encephalopathy: Secondary | ICD-10-CM | POA: Diagnosis not present

## 2018-10-23 DIAGNOSIS — I132 Hypertensive heart and chronic kidney disease with heart failure and with stage 5 chronic kidney disease, or end stage renal disease: Secondary | ICD-10-CM | POA: Diagnosis not present

## 2018-10-23 DIAGNOSIS — E43 Unspecified severe protein-calorie malnutrition: Secondary | ICD-10-CM | POA: Diagnosis not present

## 2018-10-23 DIAGNOSIS — R1311 Dysphagia, oral phase: Secondary | ICD-10-CM | POA: Diagnosis not present

## 2018-10-23 LAB — GLUCOSE, CAPILLARY
Glucose-Capillary: 125 mg/dL — ABNORMAL HIGH (ref 70–99)
Glucose-Capillary: 169 mg/dL — ABNORMAL HIGH (ref 70–99)
Glucose-Capillary: 91 mg/dL (ref 70–99)

## 2018-10-23 LAB — CBC
HCT: 29.6 % — ABNORMAL LOW (ref 36.0–46.0)
Hemoglobin: 8.7 g/dL — ABNORMAL LOW (ref 12.0–15.0)
MCH: 28.6 pg (ref 26.0–34.0)
MCHC: 29.4 g/dL — ABNORMAL LOW (ref 30.0–36.0)
MCV: 97.4 fL (ref 80.0–100.0)
Platelets: 192 10*3/uL (ref 150–400)
RBC: 3.04 MIL/uL — AB (ref 3.87–5.11)
RDW: 21.4 % — ABNORMAL HIGH (ref 11.5–15.5)
WBC: 7.5 10*3/uL (ref 4.0–10.5)
nRBC: 0 % (ref 0.0–0.2)

## 2018-10-23 LAB — RENAL FUNCTION PANEL
Albumin: 1.7 g/dL — ABNORMAL LOW (ref 3.5–5.0)
Anion gap: 12 (ref 5–15)
BUN: 50 mg/dL — ABNORMAL HIGH (ref 8–23)
CO2: 22 mmol/L (ref 22–32)
Calcium: 8.3 mg/dL — ABNORMAL LOW (ref 8.9–10.3)
Chloride: 100 mmol/L (ref 98–111)
Creatinine, Ser: 3.7 mg/dL — ABNORMAL HIGH (ref 0.44–1.00)
GFR calc Af Amer: 13 mL/min — ABNORMAL LOW (ref >60)
GFR calc non Af Amer: 12 mL/min — ABNORMAL LOW (ref >60)
Glucose, Bld: 202 mg/dL — ABNORMAL HIGH (ref 70–99)
Phosphorus: 3.9 mg/dL (ref 2.5–4.6)
Potassium: 4.4 mmol/L (ref 3.5–5.1)
Sodium: 134 mmol/L — ABNORMAL LOW (ref 135–145)

## 2018-10-23 MED ORDER — HEPARIN SODIUM (PORCINE) 1000 UNIT/ML IJ SOLN
INTRAMUSCULAR | Status: AC
  Filled 2018-10-23: qty 2

## 2018-10-23 MED ORDER — PREDNISONE 10 MG PO TABS: 10.0000 mg | ORAL_TABLET | Freq: Every day | ORAL | Status: AC

## 2018-10-23 MED ORDER — FENTANYL CITRATE (PF) 100 MCG/2ML IJ SOLN
12.5000 ug | INTRAMUSCULAR | Status: AC | PRN
  Administered 2018-10-23: 12.5 ug via INTRAVENOUS
  Filled 2018-10-23: qty 2

## 2018-10-23 MED ORDER — SUCRALFATE 1 GM/10ML PO SUSP: 1.0000 g | Freq: Three times a day (TID) | ORAL | 0 refills | Status: AC

## 2018-10-23 MED ORDER — HEPARIN SODIUM (PORCINE) 1000 UNIT/ML IJ SOLN
INTRAMUSCULAR | Status: AC
  Administered 2018-10-23: 1000 [IU]
  Filled 2018-10-23: qty 4

## 2018-10-23 MED ORDER — FENTANYL CITRATE (PF) 100 MCG/2ML IJ SOLN
INTRAMUSCULAR | Status: AC
  Administered 2018-10-23: 12.5 ug via INTRAVENOUS
  Filled 2018-10-23: qty 2

## 2018-10-23 MED ORDER — APIXABAN 2.5 MG PO TABS: 2.5000 mg | ORAL_TABLET | Freq: Two times a day (BID) | ORAL | Status: AC

## 2018-10-23 MED ORDER — DARBEPOETIN ALFA 200 MCG/0.4ML IJ SOSY
PREFILLED_SYRINGE | INTRAMUSCULAR | Status: AC
  Administered 2018-10-23: 200 ug via INTRAVENOUS
  Filled 2018-10-23: qty 0.4

## 2018-10-23 MED ORDER — HYDROXYZINE HCL 10 MG/5ML PO SYRP: 10.0000 mg | ORAL_SOLUTION | Freq: Two times a day (BID) | ORAL | 0 refills | Status: AC | PRN

## 2018-10-23 MED ORDER — INSULIN ASPART 100 UNIT/ML ~~LOC~~ SOLN: 0.0000 [IU] | Freq: Three times a day (TID) | SUBCUTANEOUS | 11 refills | Status: AC

## 2018-10-23 MED ORDER — HEPARIN SODIUM (PORCINE) 1000 UNIT/ML DIALYSIS
1800.0000 [IU] | Freq: Once | INTRAMUSCULAR | Status: AC
  Administered 2018-10-23: 1800 [IU] via INTRAVENOUS_CENTRAL

## 2018-10-23 MED ORDER — FENTANYL CITRATE (PF) 100 MCG/2ML IJ SOLN: 12.5000 ug | INTRAMUSCULAR | 0 refills | Status: AC | PRN

## 2018-10-23 MED ORDER — HYDROXYCHLOROQUINE SULFATE 200 MG PO TABS: 200.0000 mg | ORAL_TABLET | Freq: Every day | ORAL | Status: AC

## 2018-10-23 MED ORDER — FENTANYL CITRATE (PF) 100 MCG/2ML IJ SOLN
INTRAMUSCULAR | Status: AC
  Filled 2018-10-23: qty 2

## 2018-10-23 NOTE — Progress Notes (Addendum)
Called report to [place] with Melida Gimenez, RN receiving nurse at [facility] to give report.  Dressing changed per order to today, vital signs obtained and were stable. Awaiting [facility] for transport.

## 2018-10-23 NOTE — Care Management (Addendum)
Pt can go to [facility] today after HD per Raquel Sarna, [facility] liaison.  Pt will go to room 401 and receiving MD is Dr. Iona Beard Osei-Bonsu.  The number for RN report is [phone].  Pt currently in HD.  Dr. Darrick Meigs and RN aware.   1145: Pt returned from HD.  D/W RN.  Spoke with husband on the phone.  He will be in to see patient in 15-20 minutes.  [facility] notified of need for transport due to cardiac monitoring.  Per nursing request, transport scheduled for after 2:00pm.  [place] form printed.

## 2018-10-23 NOTE — Discharge Summary (Signed)
Physician Discharge Summary  [name] YIA:[id number] DOB: 1946-06-14 DOA: 09/24/2018  PCP: Sharilyn Sites, MD  Admit date: 09/24/2018 Discharge date: 10/23/2018  Time spent: 50 minutes  Recommendations for Outpatient Follow-up:  1. Patient to be transferred to [facility], [place]. 2. Wound care instructions  Hydrotherapy Monday - Wed - Fri followed by AquacelAg+, then ABD, adhere with paper tape.  BEDSIDE NURSE CHANGE DRESSING A TUES-THURS-SAT-SUN  Discharge Diagnoses:  Principal Problem:   Left foot infection Active Problems:   SLE (systemic lupus erythematosus) (HCC)   Essential hypertension, benign   COPD (chronic obstructive pulmonary disease) (HCC)   Chronic diastolic heart failure (HCC)   Goals of care, counseling/discussion   Mixed hyperlipidemia   Atrial fibrillation (HCC)   Type II diabetes mellitus with peripheral autonomic neuropathy (HCC)   ESRD on dialysis (HCC)   S/P AKA (above knee amputation) unilateral, right (HCC)   Status post amputation of left foot through metatarsal bone (HCC)   Pressure ulcer, stage 3 (HCC)   GERD (gastroesophageal reflux disease)   Gout   S/P AKA (above knee amputation) bilateral (HCC)   Acute on chronic anemia   Supplemental oxygen dependent   Hypertension   Acute on chronic diastolic CHF (congestive heart failure) (HCC)   FTT (failure to thrive) in adult   Oral candidiasis   Dysphagia   S/P AKA (above knee amputation) unilateral, left ([place])   Palliative care by specialist   Discharge Condition: Stable  Diet recommendation: Dysphagia 2 diet, fluid honey thick consistency  Filed Weights   10/21/18 0715 10/21/18 1052 10/23/18 0712  Weight: 54 kg 53.5 kg 54 kg    History of present illness:  73 yr yr old female with h/o hypertension, dyslipidemia, type 2 diabetes mellitus, COPD, GERD, gout, anemia, atrial fibrillation, DVT, ESRD TTS, peripheral artery disease s/p right AKA, lupus, CHF. Patient had transmetatarsal  amputation Sep 06, 2018, developed worsening left foot pain associated with drainage and fever. She was seen at the vascular surgery office and was diagnosed with gangrene of left foot and sent to the hospital for further evaluation. On Dec 29, she underwent left AKA , she developed respiratory failure, worsening encephalopathy and required invasive mechanical ventilation. She remained on rhe ventilator for 10 days from 10/01/18 -- 10/11/18. Her hospital stay has been complicated by septic shock and acute hypoxic respiratory failure due to ESBL E coli, pneumonia, C difficile colitis, and large decubitus ulcer.   Hospital Course:   1. Septic shock-resolved, secondary to E. coli pneumonia, she has completed 8 days of antibiotic therapy with meropenem.  2. Left limb ischemia, status post AKA-continue pain management, postop care.  Vascular surgery is following.  Patient was noted to have necrotic lateral stump, she will need revision of the stump as per surgery at a future date..  Continue dressing changes as ordered.  3. C. difficile infection-continue vancomycin 125 mg p.o. every 6 hours, rectal tube in place.Stop date 10/29/2018, treatment for total 10 days.  4. Dysphagia-patient had failed swallow evaluation after she was transferred from ICU.  She was started on nasogastric tube for nutrition.  NG tube has been discontinued and patient started on dysphagia 2 diet.  5. Atrial fibrillation-chronic, continue amiodarone, metoprolol.  Continue anticoagulation with Eliquis.  6. Type 2 diabetes mellitus-continue sliding scale insulin with NovoLog.  CBG well controlled.  7. Hypertension-blood pressures well controlled.  8. ESRD on hemodialysis-continue hemodialysis Tuesday, Thursday, Saturday.  She does have a right IJ tunnel catheter in place.  9.  Systemic lupus erythematosus-continue prednisone, Plaquenil.  10. Acute on chronic anemia-secondary to upper GI bleed from NG tube trauma.  She  required 1 unit of PRBC.  Anticoagulation was held which has been restarted.    Procedures:  Left AKA  Consultations:  Vascular surgery  Discharge Exam: Vitals:   10/23/18 1000 10/23/18 1030  BP: (!) 116/30 (!) 117/32  Pulse: 93 82  Resp:    Temp:    SpO2:      General: Appears in no acute distress Cardiovascular: S1-S2, regular Respiratory: Clear to auscultation bilaterally  Discharge Instructions   Discharge Instructions    Diet - low sodium heart healthy   Complete by:  As directed    Diet - low sodium heart healthy   Complete by:  As directed    Discharge instructions   Complete by:  As directed    Please follow with primary care in 7 days.   Increase activity slowly   Complete by:  As directed    Increase activity slowly   Complete by:  As directed      Allergies as of 10/23/2018      Reactions   Ace Inhibitors Cough   Fentanyl    Fentanyl PATCH causes lethargy, somnolence, and AMS. Tolerated IV Fentanyl in small increments.      Medication List    STOP taking these medications   fentaNYL 25 MCG/HR Commonly known as:  DURAGESIC   NOVOLOG FLEXPEN 100 UNIT/ML FlexPen Generic drug:  insulin aspart Replaced by:  insulin aspart 100 UNIT/ML injection   Oxycodone HCl 10 MG Tabs     TAKE these medications   acetaminophen 325 MG tablet Commonly known as:  TYLENOL Take 1-2 tablets (325-650 mg total) by mouth every 4 (four) hours as needed for mild pain. What changed:  reasons to take this   albuterol 108 (90 Base) MCG/ACT inhaler Commonly known as:  PROVENTIL HFA;VENTOLIN HFA Inhale 2 puffs into the lungs every 6 (six) hours as needed for wheezing or shortness of breath.   allopurinol 100 MG tablet Commonly known as:  ZYLOPRIM Take 100 mg by mouth every Monday, Wednesday, and Friday.   amiodarone 200 MG tablet Commonly known as:  PACERONE Take 1 tablet (200 mg total) by mouth daily.   apixaban 2.5 MG Tabs tablet Commonly known as:   ELIQUIS Take 1 tablet (2.5 mg total) by mouth 2 (two) times daily. What changed:    medication strength  how much to take   atorvastatin 20 MG tablet Commonly known as:  LIPITOR Take 20 mg by mouth at bedtime.   carbamazepine 100 MG 12 hr tablet Commonly known as:  TEGRETOL XR Take 100 mg by mouth 2 (two) times daily.   COMBIGAN 0.2-0.5 % ophthalmic solution Generic drug:  brimonidine-timolol Place 1 drop into the left eye 2 (two) times daily.   docusate sodium 100 MG capsule Commonly known as:  COLACE Take 100 mg by mouth 2 (two) times daily.   feeding supplement (PRO-STAT SUGAR FREE 64) Liqd Take 30 mLs by mouth 2 (two) times daily.   fentaNYL 100 MCG/2ML injection Commonly known as:  SUBLIMAZE Inject 0.25 mLs (12.5 mcg total) into the vein every 2 (two) hours as needed for moderate pain.   folic acid 841 MCG tablet Commonly known as:  FOLVITE Take 800 mcg by mouth daily.   hydroxychloroquine 200 MG tablet Commonly known as:  PLAQUENIL Take 1 tablet (200 mg total) by mouth daily.   hydrOXYzine 10 MG/5ML  syrup Commonly known as:  ATARAX Take 5 mLs (10 mg total) by mouth 2 (two) times daily as needed for itching.   insulin aspart 100 UNIT/ML injection Commonly known as:  novoLOG Inject 0-9 Units into the skin 3 (three) times daily with meals. Replaces:  NOVOLOG FLEXPEN 100 UNIT/ML FlexPen   latanoprost 0.005 % ophthalmic solution Commonly known as:  XALATAN Place 1 drop into both eyes at bedtime.   metoprolol tartrate 25 MG tablet Commonly known as:  LOPRESSOR Take 1/2 tablet twice a day on Mondays, Wednesdays and Fridays What changed:    how much to take  how to take this  when to take this  additional instructions   multivitamin Tabs tablet Take 1 tablet by mouth daily.   pantoprazole 40 MG tablet Commonly known as:  PROTONIX Take 1 tablet (40 mg total) by mouth daily.   predniSONE 10 MG tablet Commonly known as:  DELTASONE Take 1 tablet (10  mg total) by mouth daily with breakfast. What changed:    medication strength  how much to take   sodium hypochlorite 0.125 % Soln Commonly known as:  DAKIN'S 1/4 STRENGTH Irrigate with as directed every morning.   sucralfate 1 GM/10ML suspension Commonly known as:  CARAFATE Take 10 mLs (1 g total) by mouth 4 (four) times daily -  with meals and at bedtime for 30 days.   UNIFINE PENTIPS 31G X 6 MM Misc Generic drug:  Insulin Pen Needle USE AS DIRECTED AT BEDTIME WITH LEVEMIR AND WITH SLIDING SCALE INSULIN. What changed:  See the new instructions.   vancomycin 50 mg/mL  oral solution Commonly known as:  VANCOCIN Take 2.5 mLs (125 mg total) by mouth every 6 (six) hours for 10 days.      Allergies  Allergen Reactions  . Ace Inhibitors Cough  . Fentanyl     Fentanyl PATCH causes lethargy, somnolence, and AMS. Tolerated IV Fentanyl in small increments.      The results of significant diagnostics from this hospitalization (including imaging, microbiology, ancillary and laboratory) are listed below for reference.    Significant Diagnostic Studies: Dg Abd 1 View  Result Date: 10/07/2018 CLINICAL DATA:  Check OG placement EXAM: ABDOMEN - 1 VIEW COMPARISON:  10/01/2018 FINDINGS: Gastric catheter is noted with the tip in the mid stomach. No obstructive changes are seen. Scattered vascular calcifications are noted. Degenerative change of the lumbar spine is seen. IMPRESSION: OG placement within the stomach. Electronically Signed   By: Inez Catalina M.D.   On: 10/07/2018 21:48   Dg Abd 1 View  Result Date: 09/25/2018 CLINICAL DATA:  Abdominal and right hip pain. EXAM: ABDOMEN - 1 VIEW COMPARISON:  07/28/2018 CT and 12/16/2017 abdomen radiograph FINDINGS: Scattered air containing small large bowel without bowel obstruction. Lumbar spondylosis with degenerative disc disease is identified greatest at L2-3. Mild joint space narrowing of both hips. No radiopaque calculi nor acute osseous  abnormality. IMPRESSION: Nonspecific bowel gas pattern without bowel obstruction. Lumbar spondylosis. Electronically Signed   By: Ashley Royalty M.D.   On: 09/25/2018 22:37   Ct Head Wo Contrast  Result Date: 10/05/2018 CLINICAL DATA:  Initial evaluation for acute altered mental status. EXAM: CT HEAD WITHOUT CONTRAST TECHNIQUE: Contiguous axial images were obtained from the base of the skull through the vertex without intravenous contrast. COMPARISON:  Prior CT from 09/24/2018. FINDINGS: Brain: Atrophy with chronic microvascular ischemic disease, stable. Subcentimeter left parafalcine meningioma noted as well, unchanged. No acute intracranial hemorrhage. No acute large vessel  territory infarct. No other mass lesion. No midline shift or mass effect. No hydrocephalus. No extra-axial fluid collection. Vascular: No hyperdense vessel. Scattered vascular calcifications noted within the carotid siphons. Skull: Scalp soft tissues and calvarium within normal limits. Sinuses/Orbits: Globes orbital soft tissues demonstrate no acute finding. Left ocular esotropia noted. Paranasal sinuses and mastoid air cells are clear. Other: None. IMPRESSION: 1. Stable appearance of the head. No acute intracranial abnormality identified. 2. Underlying atrophy with chronic small vessel ischemic disease. Unchanged small left parafalcine meningioma. Electronically Signed   By: Jeannine Boga M.D.   On: 10/05/2018 01:56   Ct Head Wo Contrast  Result Date: 09/24/2018 CLINICAL DATA:  Encephalopathy EXAM: CT HEAD WITHOUT CONTRAST TECHNIQUE: Contiguous axial images were obtained from the base of the skull through the vertex without intravenous contrast. COMPARISON:  MRI 12/26/2017 FINDINGS: Brain: Small left parafalcine meningioma measuring 7 x 3 x 6 mm, stable in appearance. Patchy periventricular white matter hypodensities compatible with chronic microvascular ischemia. No large vascular territory infarct. Chronic involutional changes  brain with sulcal and ventricular prominence. Intra-axial mass nor extra-axial fluid. No acute intracranial hemorrhage. Small chronic basal ganglia lacunar infarcts are again noted. Midline fourth ventricle basal cisterns without effacement. Brainstem and cerebellum appear nonacute. Vascular: No hyperdense vessel sign. Skull: Intact Sinuses/Orbits: Bilateral lens replacements. Slight lateral deviation of gaze involving the right globe. No acute sinus disease. Other: No significant calvarial soft tissue swelling. IMPRESSION: 1. Stable small left parafalcine meningioma measuring 7 x 3 x 6 mm. 2. Chronic small vessel ischemic disease. No acute intracranial abnormality. Electronically Signed   By: Ashley Royalty M.D.   On: 09/24/2018 21:44   Dg Chest Port 1 View  Result Date: 10/12/2018 CLINICAL DATA:  Acute onset shortness of breath. In stage renal disease on hemodialysis. EXAM: PORTABLE CHEST 1 VIEW COMPARISON:  10/02/2018 and earlier. FINDINGS: Cardiac silhouette markedly enlarged, unchanged. Thoracic aorta atherosclerotic, unchanged. Hilar and mediastinal contours otherwise unremarkable. Pulmonary venous hypertension without overt edema currently. Streaky and patchy opacities at the MEDIAL RIGHT lung base. Lungs otherwise clear. Small BILATERAL pleural effusions, unchanged. RIGHT jugular dialysis catheter tips project at or near the cavoatrial junction, unchanged. Interval extubation and nasogastric tube removal. IMPRESSION: 1. Atelectasis and/or bronchopneumonia at the MEDIAL RIGHT lung base. Stable small BILATERAL pleural effusions. No acute cardiopulmonary disease otherwise. 2. Stable marked cardiomegaly. Pulmonary venous hypertension without overt edema currently. Electronically Signed   By: Evangeline Dakin M.D.   On: 10/12/2018 09:03   Dg Chest Port 1 View  Result Date: 10/02/2018 CLINICAL DATA:  Respiratory failure. EXAM: PORTABLE CHEST 1 VIEW COMPARISON:  10/01/1998 FINDINGS: Endotracheal tube remains  present with the tip roughly 5 cm above the carina. Gastric decompression tube extends below the diaphragm. Tunneled dialysis catheter stable in appearance. Stable cardiac enlargement. Lungs show suggestion of potential mild interstitial edema and left lower lobe atelectasis. No pleural effusions or pneumothorax. IMPRESSION: Possible mild pulmonary interstitial edema and left lower lobe atelectasis. Electronically Signed   By: Aletta Edouard M.D.   On: 10/02/2018 08:07   Dg Chest Port 1 View  Result Date: 10/01/2018 CLINICAL DATA:  Acute respiratory failure with hypoxia. Follow-up exam. EXAM: PORTABLE CHEST 1 VIEW COMPARISON:  10/01/2018 at 1313 hours. FINDINGS: Since prior exam, an endotracheal tube has been placed. Tip projects 2.7 cm above the Carina. There is also new nasal/orogastric tube, which passes below the diaphragm well into the stomach. Stable mild cardiomegaly. Lungs demonstrate mildly prominent bronchovascular markings but no evidence of pneumonia  or pulmonary edema. Right internal jugular dual-lumen tunneled central venous catheter is stable. No pneumothorax or pleural effusion. IMPRESSION: 1. New endotracheal tube and nasal/orogastric tube are both well positioned. 2. No other change from the earlier exam. No acute findings in the lungs. Electronically Signed   By: Lajean Manes M.D.   On: 10/01/2018 17:58   Dg Chest Port 1 View  Result Date: 10/01/2018 CLINICAL DATA:  Shortness of breath. EXAM: PORTABLE CHEST 1 VIEW COMPARISON:  Radiograph September 27, 2018. FINDINGS: Stable cardiomegaly. No pneumothorax or pleural effusion is noted. Right internal jugular dialysis catheter is unchanged in position. Both lungs are clear. The visualized skeletal structures are unremarkable. IMPRESSION: No acute cardiopulmonary abnormality seen. Aortic Atherosclerosis (ICD10-I70.0). Electronically Signed   By: Marijo Conception, M.D.   On: 10/01/2018 13:39   Dg Chest Port 1 View  Result Date:  09/27/2018 CLINICAL DATA:  Chest congestion and cough began this morning. EXAM: PORTABLE CHEST 1 VIEW COMPARISON:  Portable chest x-ray of September 24, 2018 FINDINGS: The lungs are well-expanded. Slightly increased lung markings are noted greatest on the left. There is a small amount of thickening of the minor fissure. The cardiac silhouette is mildly enlarged though stable. The pulmonary vascularity is slightly more conspicuous. The dialysis catheter tip projects over the distal third of the SVC. There is calcification in the wall of the tortuous thoracic aorta. IMPRESSION: Findings suggest low-grade CHF with mild asymmetric interstitial edema greatest on the left. Stable enlargement of the cardiac silhouette. No pleural effusion. Thoracic aortic atherosclerosis. Electronically Signed   By: David  Martinique M.D.   On: 09/27/2018 08:09   Dg Chest Port 1 View  Result Date: 09/24/2018 CLINICAL DATA:  Fever over diabetic foot wound. EXAM: PORTABLE CHEST 1 VIEW COMPARISON:  03/01/2018 FINDINGS: Stable cardiomegaly with aortic atherosclerosis. Right IJ approach dialysis catheter is noted with tip in the proximal right atrium. Lungs are clear without edema or consolidation. No effusion or pneumothorax. No acute osseous appearing abnormality. IMPRESSION: No active disease.  Stable cardiomegaly with aortic atherosclerosis. Electronically Signed   By: Ashley Royalty M.D.   On: 09/24/2018 17:50   Dg Abd Portable 1v  Result Date: 10/11/2018 CLINICAL DATA:  NG tube placement EXAM: PORTABLE ABDOMEN - 1 VIEW COMPARISON:  10/11/2018 FINDINGS: The enteric tube previously visualized over the right heart has been repositioned and advanced. Tube tip is now in the right lower lung suggesting position in a right lower lobe bronchus. Removal and repositioning is recommended. IMPRESSION: Enteric tube tip is in the right lower lung suggesting position in a right lower lobe bronchus. Removal and repositioning is recommended. These  results were called by telephone at the time of interpretation on 10/11/2018 at 11:05 pm to Dr. Corinna Lines , who verbally acknowledged these results. Electronically Signed   By: Lucienne Capers M.D.   On: 10/11/2018 23:14   Dg Abd Portable 1v  Result Date: 10/11/2018 CLINICAL DATA:  NG tube placement EXAM: PORTABLE ABDOMEN - 1 VIEW COMPARISON:  10/07/2018 FINDINGS: A coiled segment of tubing, possibly the enteric tube, is demonstrated at the edge of the image and projected over the right heart. This could represent tube placement in the right mainstem bronchus. Chest radiograph suggested for further evaluation, or could remove and reposition the tube. No tubing demonstrated in the expected location of the stomach. Scattered gas in the colon and small bowel without distention. No radiopaque stones. Degenerative changes in the spine. IMPRESSION: Enteric tube appears to be coiled in the  right mainstem bronchus. Recommend chest radiograph for further evaluation or removal and repositioning of the tube. Electronically Signed   By: Lucienne Capers M.D.   On: 10/11/2018 23:02   Dg Abd Portable 1v  Result Date: 10/01/2018 CLINICAL DATA:  Shortness of breath and fever. EXAM: PORTABLE ABDOMEN - 1 VIEW COMPARISON:  09/25/2018 FINDINGS: Bowel gas pattern is nonobstructive. No evidence of free peritoneal air. Degenerative change of the spine and hips. IMPRESSION: Nonobstructive bowel gas pattern. Electronically Signed   By: Marin Olp M.D.   On: 10/01/2018 13:32   Dg Swallowing Func-speech Pathology  Result Date: 10/19/2018 Objective Swallowing Evaluation: Type of Study: MBS-Modified Barium Swallow Study  Patient Details Name: [name] MRN: [id number] Date of Birth: September 27, 1946 Today's Date: 10/19/2018 Time: SLP Start Time (ACUTE ONLY): 0930 -SLP Stop Time (ACUTE ONLY): 1005 SLP Time Calculation (min) (ACUTE ONLY): 35 min Past Medical History: Past Medical History: Diagnosis Date . Anemia  . Arthritis  . Asthma   . Atrial fibrillation ([place])  . Atrial flutter ([place])   a. s/p TEE/DCCV December 2013 b. 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 Bone spur of toe   Right 5th toe . Chronic diastolic CHF (congestive heart failure) (HCC)   a. EF 50-55% by echo in 2015 . Colon polyposis  . COPD (chronic obstructive pulmonary disease) ([place])  . DVT of upper extremity (deep vein thrombosis) ([place])   Right basilic vein, June 7026 . ESRD (end stage renal disease) on dialysis ([place])   "TTF; [place]; off [employer]." (09/07/2018) . Essential hypertension, benign  . Fractures  . Glaucoma  . Lupus ([place])  . Pericarditis  . SLE (systemic lupus erythematosus) ([place])  . Type 2 diabetes mellitus ([place])  . UTI (lower urinary tract infection)  . Wound of right leg 06/2017 Past Surgical History: Past Surgical History: Procedure Laterality Date . ABDOMINAL AORTOGRAM W/LOWER EXTREMITY N/A 12/18/2017  Procedure: ABDOMINAL AORTOGRAM W/LOWER EXTREMITY;  Surgeon: Elam Dutch, MD;  Location: [facility] CV LAB;  Service: Cardiovascular;  Laterality: N/A;  bilateral . ABDOMINAL HYSTERECTOMY  1983 . AMPUTATION Right 06/08/2018  Procedure: RIGHT ABOVE KNEE AMPUTATION;  Surgeon: Elam Dutch, MD;  Location[facility];  Service: Vascular;  Laterality: Right; . AMPUTATION Left 09/26/2018  Procedure: AMPUTATION ABOVE KNEE;  Surgeon: Rosetta Posner, MD;  Location: [facility] OR;  Service: Vascular;  Laterality: Left;  WILL BE AN INPATIENT . ANKLE SURGERY  1993 . AV FISTULA PLACEMENT Left 03/27/2014  Procedure: ARTERIOVENOUS (AV) FISTULA CREATION;  Surgeon: Rosetta Posner, MD;  Location: [place];  Service: Vascular;  Laterality: Left; . BACK SURGERY  1980 . BASCILIC VEIN TRANSPOSITION Left 01/13/2018  Procedure: LEFT BASILIC VEIN TRANSPOSITION SECOND STAGE;  Surgeon: Elam Dutch, MD;  Location: [place];   Service: Vascular;  Laterality: Left; . CARDIOVASCULAR STRESS TEST  12/19/2009  no stress induced rhythm abnormalities, ekg negative for ischemia . CARDIOVERSION  09/16/2012  Procedure: CARDIOVERSION;  Surgeon: Sanda Klein, MD;  Location: [facility] ENDOSCOPY;  Service: Cardiovascular;  Laterality: N/A; . CATARACT EXTRACTION   . COLONOSCOPY  09/20/2012  Dr. [name]: multiple tubular adenomas  . COLONOSCOPY  2005  Dr. [name]. Polyps, path unknown  . COLONOSCOPY N/A 07/24/2016  Dr. [name]: 3 significant size polyps removed from the colon, tubular adenomas.  Next colonoscopy in October 2020. [facility] ESOPHAGOGASTRODUODENOSCOPY  09/20/2012  Dr. [name]: duodenal erosion and possible resolving ulcer at the angularis  . ESOPHAGOGASTRODUODENOSCOPY N/A 03/24/2018  Dr. [name]: Mild erosive reflux esophagitis, erosive gastropathy and enteropathy fairly extensive, no H. pylori on biopsy. [facility] EYE SURGERY   . IR FLUORO GUIDE CV LINE RIGHT  11/09/2017 . IR RADIOLOGIST EVAL & MGMT  09/01/2018 . IR US GUIDE VASC ACCESS RIGHT  11/09/2017 . REVISON OF ARTERIOVENOUS FISTULA Left 03/15/2018  Procedure: REVISION OF Left arm BASILIC VEIN TRANSPOSITION;  Surgeon: Angelia Mould, MD;  Location: [place];  Service: Vascular;  Laterality: Left; . TEE WITHOUT CARDIOVERSION  09/16/2012  Procedure: TRANSESOPHAGEAL ECHOCARDIOGRAM (TEE);  Surgeon: Sanda Klein, MD;  Location: [facility] ENDOSCOPY;  Service: Cardiovascular;  Laterality: N/A; . TRANSESOPHAGEAL ECHOCARDIOGRAM WITH CARDIOVERSION  09/16/2012  EF 60-65%, moderate LVH, moderate regurg of the aortic valve, LA moderately dilated . TRANSMETATARSAL AMPUTATION Left 09/06/2018  Procedure: TRANSMETATARSAL AMPUTATION;  Surgeon: Angelia Mould, MD;  Location: [facility] OR;  Service: Vascular;  Laterality: Left; HPI: Pt is a 73 y.o. female admitted with progressive gangrene of her left foot and is status post AKA on 09/26/2018. She has demonstrated poor p.o. intake, oral thush, as well as " significant difficulty with  secretion clearance, evolving poor cough and increased work of breathing in addition to malnutrition". She developed encephalopathy, was unable to clear airway secretions and required intubation on 1/3 with extubation on 1/13. Nursing reported that NG tube placement was attempted four times without success.  Pt now with Cortrak. Voice largely aphonic.  Subjective: Pt awake and alert, pleasant and cooperative. No family present. Pt reports she feels her voice quality is slightly improved. Still largely aphonic. Assessment / Plan / Recommendation CHL IP CLINICAL IMPRESSIONS 10/19/2018 Clinical Impression Patient has been NPO at bedside with cortrac present for nutrition and ice chips with nursing. 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  Patient is also on a specialty bed for a large decubitus ulcer impacting her ability to sit at 90 degrees for meals. Patient has a mild to moderate oropharyngeal dysphagia likely impacted by long intubation as well as deconditioned state after long hospitalization. She required 2 swallows to clear thin liquid bolus, but only one with thickened liquids. Delayed swallow initiation at the pyriform sinuses with thin liquids and the valleculae for all other consistencies. Reduced laryngeal elevation and anterior movement resulting in poor laryngeal closure and airway protection across all consistencies. Small amounts of consistent penetration of thin and nectar thickened liquids with no triggered cough response and eventually aspirated below the cords. Patient had a delayed cough in response to thin liquid passing below the cords but unable to fully clear. Mild pharyngeal residue impacted by NG tube. Patient has a fair to good prognosis for swallow to improve as her over all medical status improves, NG tube is removed and her ability to sit for longer periods improves. Recommend a Dys 2, Honey thickened diet with medications  given whole in puree. Speech therapy to follow up for diet tolerance, aspiration precaution training, pharyngeal exercise and diet upgrade.  SLP Visit Diagnosis Dysphagia, oropharyngeal phase (R13.12) Attention and concentration deficit following -- Frontal lobe and executive function deficit following -- Impact on safety and function Moderate aspiration risk   CHL IP TREATMENT RECOMMENDATION 10/19/2018 Treatment Recommendations Therapy as outlined in treatment plan below   Prognosis 10/19/2018 Prognosis for Safe Diet Advancement Good Barriers to Reach Goals Time post onset;Severity of deficits Barriers/Prognosis Comment -- CHL IP DIET  RECOMMENDATION 10/19/2018 SLP Diet Recommendations Dysphagia 2 (Fine chop) solids;Honey thick liquids;Ice chips PRN after oral care Liquid Administration via Cup Medication Administration Whole meds with puree Compensations Slow rate;Small sips/bites Postural Changes Seated upright at 90 degrees   CHL IP OTHER RECOMMENDATIONS 10/19/2018 Recommended Consults -- Oral Care Recommendations Oral care QID Other Recommendations Order thickener from pharmacy   CHL IP FOLLOW UP RECOMMENDATIONS 10/19/2018 Follow up Recommendations Skilled Nursing facility   [facility] IP FREQUENCY AND DURATION 10/12/2018 Speech Therapy Frequency (ACUTE ONLY) min 2x/week Treatment Duration 2 weeks      CHL IP ORAL PHASE 10/19/2018 Oral Phase Impaired Oral - Pudding Teaspoon -- Oral - Pudding Cup -- Oral - Honey Teaspoon -- Oral - Honey Cup -- Oral - Nectar Teaspoon -- Oral - Nectar Cup -- Oral - Nectar Straw -- Oral - Thin Teaspoon -- Oral - Thin Cup Piecemeal swallowing;Weak lingual manipulation Oral - Thin Straw -- Oral - Puree -- Oral - Mech Soft Weak lingual manipulation;Impaired mastication Oral - Regular -- Oral - Multi-Consistency -- Oral - Pill -- Oral Phase - Comment --  CHL IP PHARYNGEAL PHASE 10/19/2018 Pharyngeal Phase Impaired Pharyngeal- Pudding Teaspoon -- Pharyngeal -- Pharyngeal- Pudding Cup -- Pharyngeal --  Pharyngeal- Honey Teaspoon Delayed swallow initiation-vallecula;Reduced pharyngeal peristalsis;Reduced anterior laryngeal mobility;Reduced laryngeal elevation;Reduced airway/laryngeal closure Pharyngeal Material does not enter airway Pharyngeal- Honey Cup -- Pharyngeal -- Pharyngeal- Nectar Teaspoon NT Pharyngeal -- Pharyngeal- Nectar Cup Delayed swallow initiation-vallecula;Reduced pharyngeal peristalsis;Reduced epiglottic inversion;Reduced anterior laryngeal mobility;Reduced laryngeal elevation;Reduced airway/laryngeal closure;Penetration/Aspiration during swallow;Trace aspiration;Pharyngeal residue - valleculae Pharyngeal Material enters airway, passes BELOW cords and not ejected out despite cough attempt by patient Pharyngeal- Nectar Straw -- Pharyngeal -- Pharyngeal- Thin Teaspoon -- Pharyngeal -- Pharyngeal- Thin Cup Delayed swallow initiation-pyriform sinuses;Reduced pharyngeal peristalsis;Reduced epiglottic inversion;Reduced anterior laryngeal mobility;Reduced laryngeal elevation;Reduced airway/laryngeal closure;Penetration/Aspiration during swallow;Trace aspiration;Pharyngeal residue - valleculae Pharyngeal Material enters airway, passes BELOW cords and not ejected out despite cough attempt by patient Pharyngeal- Thin Straw -- Pharyngeal -- Pharyngeal- Puree Delayed swallow initiation-vallecula;Reduced pharyngeal peristalsis;Reduced epiglottic inversion;Reduced anterior laryngeal mobility;Reduced laryngeal elevation;Reduced airway/laryngeal closure Pharyngeal -- Pharyngeal- Mechanical Soft Pharyngeal residue - valleculae;Delayed swallow initiation-vallecula Pharyngeal -- Pharyngeal- Regular -- Pharyngeal -- Pharyngeal- Multi-consistency -- Pharyngeal -- Pharyngeal- Pill -- Pharyngeal -- Pharyngeal Comment --  CHL IP CERVICAL ESOPHAGEAL PHASE 10/19/2018 Cervical Esophageal Phase WFL Pudding Teaspoon -- Pudding Cup -- Honey Teaspoon -- Honey Cup -- Nectar Teaspoon -- Nectar Cup -- Nectar Straw -- Thin Teaspoon  -- Thin Cup -- Thin Straw -- Puree -- Mechanical Soft -- Regular -- Multi-consistency -- Pill -- Cervical Esophageal Comment -- [name] Ward 10/19/2018, 10:56 AM              Dg Swallowing Func-speech Pathology  Result Date: 10/12/2018 Objective Swallowing Evaluation: Type of Study: MBS-Modified Barium Swallow Study  Patient Details Name: [name] MRN: [id number] Date of Birth: 08-30-1946 Today's Date: 10/12/2018 Time: SLP Start Time (ACUTE ONLY): 1300 -SLP Stop Time (ACUTE ONLY): 1330 SLP Time Calculation (min) (ACUTE ONLY): 30 min Past Medical History: Past Medical History: Diagnosis Date . Anemia  . Arthritis  . Asthma  . Atrial fibrillation ([place])  . Atrial flutter ([place])   a. s/p TEE/DCCV December 2013 b. 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 Bone spur of toe   Right 5th toe . Chronic diastolic CHF (congestive heart failure) (  [place])   a. EF 50-55% by echo in 2015 . Colon polyposis  . COPD (chronic obstructive pulmonary disease) ([place])  . DVT of upper extremity (deep vein thrombosis) ([place])   Right basilic vein, June 8756 . ESRD (end stage renal disease) on dialysis ([place])   "TTF; [place]; off [employer]." (09/07/2018) . Essential hypertension, benign  . Fractures  . Glaucoma  . Lupus ([place])  . Pericarditis  . SLE (systemic lupus erythematosus) ([place])  . Type 2 diabetes mellitus ([place])  . UTI (lower urinary tract infection)  . Wound of right leg 06/2017 Past Surgical History: Past Surgical History: Procedure Laterality Date . ABDOMINAL AORTOGRAM W/LOWER EXTREMITY N/A 12/18/2017  Procedure: ABDOMINAL AORTOGRAM W/LOWER EXTREMITY;  Surgeon: Elam Dutch, MD;  Location: [place] [facility];  Service: Cardiovascular;  Laterality: N/A;  bilateral . ABDOMINAL HYSTERECTOMY  1983 . AMPUTATION Right 06/08/2018  Procedure: RIGHT ABOVE KNEE  AMPUTATION;  Surgeon: Elam Dutch, MD;  Location: [facility] OR;  Service: Vascular;  Laterality: Right; . AMPUTATION Left 09/26/2018  Procedure: AMPUTATION ABOVE KNEE;  Surgeon: Rosetta Posner, MD;  Location: [facility] OR;  Service: Vascular;  Laterality: Left;  WILL BE AN INPATIENT . ANKLE SURGERY  1993 . AV FISTULA PLACEMENT Left 03/27/2014  Procedure: ARTERIOVENOUS (AV) FISTULA CREATION;  Surgeon: Rosetta Posner, MD;  Location: [place];  Service: Vascular;  Laterality: Left; . BACK SURGERY  1980 . BASCILIC VEIN TRANSPOSITION Left 01/13/2018  Procedure: LEFT BASILIC VEIN TRANSPOSITION SECOND STAGE;  Surgeon: Elam Dutch, MD;  Location: [place];  Service: Vascular;  Laterality: Left; . CARDIOVASCULAR STRESS TEST  12/19/2009  no stress induced rhythm abnormalities, ekg negative for ischemia . CARDIOVERSION  09/16/2012  Procedure: CARDIOVERSION;  Surgeon: Sanda Klein, MD;  Location: [facility];  Service: Cardiovascular;  Laterality: N/A; . CATARACT EXTRACTION   . COLONOSCOPY  09/20/2012  Dr. [name]: multiple tubular adenomas  . COLONOSCOPY  2005  Dr. [name]. Polyps, path unknown  . COLONOSCOPY N/A 07/24/2016  Dr. [name]: 3 significant size polyps removed from the colon, tubular adenomas.  Next colonoscopy in October 2020. [facility] Kitchen ESOPHAGOGASTRODUODENOSCOPY  09/20/2012  Dr. [name]: duodenal erosion and possible resolving ulcer at the angularis  . ESOPHAGOGASTRODUODENOSCOPY N/A 03/24/2018  Dr. [name]: Mild erosive reflux esophagitis, erosive gastropathy and enteropathy fairly extensive, no H. pylori on biopsy. Marland Kitchen EYE SURGERY   . IR FLUORO GUIDE CV LINE RIGHT  11/09/2017 . IR RADIOLOGIST EVAL & MGMT  09/01/2018 . IR US GUIDE VASC ACCESS RIGHT  11/09/2017 . REVISON OF ARTERIOVENOUS FISTULA Left 03/15/2018  Procedure: REVISION OF Left arm BASILIC VEIN TRANSPOSITION;  Surgeon: Angelia Mould, MD;  Location: [facility];  Service: Vascular;  Laterality: Left; . TEE WITHOUT CARDIOVERSION  09/16/2012  Procedure: TRANSESOPHAGEAL ECHOCARDIOGRAM  (TEE);  Surgeon: Sanda Klein, MD;  Location[facility] ENDOSCOPY;  Service: Cardiovascular;  Laterality: N/A; . TRANSESOPHAGEAL ECHOCARDIOGRAM WITH CARDIOVERSION  09/16/2012  EF 60-65%, moderate LVH, moderate regurg of the aortic valve, LA moderately dilated . TRANSMETATARSAL AMPUTATION Left 09/06/2018  Procedure: TRANSMETATARSAL AMPUTATION;  Surgeon: Angelia Mould, MD;  Location: [facility] OR;  Service: Vascular;  Laterality: Left; HPI: Pt is a 73 y.o. female admitted with progressive gangrene of her left foot and is status post AKA on 09/26/2018. She has demonstrated poor p.o. intake, oral thush, as well as " significant difficulty with secretion clearance, evolving poor cough and increased work of breathing in addition to malnutrition". She developed encephalopathy, was unable to clear airway secretions and required intubation on 1/3  with extubation on 1/13. Nursing reported that NG tube placement was attempted four times without success.  Subjective: alert, follows commands Assessment / Plan / Recommendation CHL IP CLINICAL IMPRESSIONS 10/12/2018 Clinical Impression Pt presents with a post-extubation dysphagia characterized by aspiration of tspn-sized boluses of nectar and thin liquids during the swallow.  There is adequate oral control, swallow onset, and pharyngeal constriction, but consistent spilling of materials into posterior larynx/trachea with no cough response elicited.  This pattern of aspiration is typical of post-extubation dysphagia, marked by probable glottal incompetence (corresponding with clinical sign of aphonia) and leading to aspiration between the posterior vocal folds without sensation.  Purees, due to their thicker nature, were not aspirated.  If necessary, PO meds may be given crushed in puree.  Pt would benefit from a cortrak if it can be successfully placed - she is not yet safe to begin a PO diet.  SLP will follow for PO readiness/diet progression.   SLP Visit Diagnosis Dysphagia,  pharyngeal phase (R13.13) Attention and concentration deficit following -- Frontal lobe and executive function deficit following -- Impact on safety and function Severe aspiration risk   CHL IP TREATMENT RECOMMENDATION 10/12/2018 Treatment Recommendations Therapy as outlined in treatment plan below   Prognosis 10/12/2018 Prognosis for Safe Diet Advancement Good Barriers to Reach Goals -- Barriers/Prognosis Comment -- CHL IP DIET RECOMMENDATION 10/12/2018 SLP Diet Recommendations NPO except meds Liquid Administration via -- Medication Administration Crushed with puree Compensations -- Postural Changes --   CHL IP OTHER RECOMMENDATIONS 10/12/2018 Recommended Consults -- Oral Care Recommendations Oral care QID Other Recommendations --   CHL IP FOLLOW UP RECOMMENDATIONS 09/29/2018 Follow up Recommendations Skilled Nursing facility   [facility] IP FREQUENCY AND DURATION 10/12/2018 Speech Therapy Frequency (ACUTE ONLY) min 2x/week Treatment Duration 2 weeks      CHL IP ORAL PHASE 10/12/2018 Oral Phase WFL Oral - Pudding Teaspoon -- Oral - Pudding Cup -- Oral - Honey Teaspoon -- Oral - Honey Cup -- Oral - Nectar Teaspoon -- Oral - Nectar Cup -- Oral - Nectar Straw -- Oral - Thin Teaspoon -- Oral - Thin Cup -- Oral - Thin Straw -- Oral - Puree -- Oral - Mech Soft -- Oral - Regular -- Oral - Multi-Consistency -- Oral - Pill -- Oral Phase - Comment --  CHL IP PHARYNGEAL PHASE 10/12/2018 Pharyngeal Phase Impaired Pharyngeal- Pudding Teaspoon -- Pharyngeal -- Pharyngeal- Pudding Cup -- Pharyngeal -- Pharyngeal- Honey Teaspoon Delayed swallow initiation-vallecula;Reduced airway/laryngeal closure;Penetration/Aspiration during swallow;Moderate aspiration Pharyngeal Material enters airway, passes BELOW cords without attempt by patient to eject out (silent aspiration) Pharyngeal- Honey Cup -- Pharyngeal -- Pharyngeal- Nectar Teaspoon Delayed swallow initiation-vallecula;Reduced airway/laryngeal closure;Penetration/Aspiration during swallow;Moderate  aspiration Pharyngeal Material enters airway, passes BELOW cords without attempt by patient to eject out (silent aspiration) Pharyngeal- Nectar Cup -- Pharyngeal -- Pharyngeal- Nectar Straw -- Pharyngeal -- Pharyngeal- Thin Teaspoon -- Pharyngeal -- Pharyngeal- Thin Cup -- Pharyngeal -- Pharyngeal- Thin Straw -- Pharyngeal -- Pharyngeal- Puree Delayed swallow initiation-vallecula Pharyngeal -- Pharyngeal- Mechanical Soft -- Pharyngeal -- Pharyngeal- Regular -- Pharyngeal -- Pharyngeal- Multi-consistency -- Pharyngeal -- Pharyngeal- Pill -- Pharyngeal -- Pharyngeal Comment --  No flowsheet data found. Juan Quam Laurice 10/12/2018, 5:21 PM              Dg Hip Unilat With Pelvis 1v Right  Result Date: 09/25/2018 CLINICAL DATA:  Right hip pain EXAM: DG HIP (WITH OR WITHOUT PELVIS) 1V RIGHT COMPARISON:  None. FINDINGS: L4-5 degenerative disc disease with lower lumbar facet arthropathy  is seen. The bony pelvis appears intact without acute fracture or suspicious osseous lesions. No pelvic diastasis is noted. Mild-to-moderate joint space narrowing of both hips appears stable. Mild subchondral sclerosis of the femoral heads are identified without flattening. Findings may be degenerative in etiology. Subtle changes of AVN are not entirely excluded. Soft tissue calcification adjacent to the greater trochanter of the right femur may reflect gluteal tendinosis. IMPRESSION: 1. No acute osseous abnormality of the bony pelvis and right hip. 2. Mild-to-moderate degenerative joint space narrowing of both hips. Subchondral sclerosis of the femoral heads bilaterally without flattening may reflect osteoarthritis. Changes of AVN are not entirely excluded. 3. Soft tissue calcification adjacent to the greater trochanter of the right femur may reflect gluteal tendinosis. Electronically Signed   By: Ashley Royalty M.D.   On: 09/25/2018 22:40    Microbiology: No results found for this or any previous visit (from the past 240  hour(s)).   Labs: Basic Metabolic Panel: Recent Labs  Lab 10/19/18 0330 10/20/18 0306 10/21/18 0312 10/22/18 0318 10/23/18 0241  NA 133* 136 134* 134* 134*  K 4.0 3.7 3.9 3.8 4.4  CL 101 102 100 100 100  CO2 18* 23 21* 24 22  GLUCOSE 111* 169* 127* 163* 202*  BUN 58* 22 49* 31* 50*  CREATININE 3.43* 1.84* 3.24* 2.51* 3.70*  CALCIUM 8.9 8.3* 8.5* 8.1* 8.3*  PHOS 4.8* 2.2* 3.4 2.4* 3.9   Liver Function Tests: Recent Labs  Lab 10/19/18 0330 10/20/18 0306 10/21/18 0312 10/22/18 0318 10/23/18 0241  ALBUMIN 1.9* 1.7* 1.8* 1.7* 1.7*   No results for input(s): LIPASE, AMYLASE in the last 168 hours. No results for input(s): AMMONIA in the last 168 hours. CBC: Recent Labs  Lab 10/17/18 0228 10/18/18 0244 10/19/18 0330 10/20/18 0306 10/23/18 0738  WBC 14.1* 14.1* 12.0* 10.6* 7.5  HGB 9.3* 9.7* 9.7* 9.0* 8.7*  HCT 31.7* 33.6* 33.0* 31.7* 29.6*  MCV 92.7 95.2 95.1 96.6 97.4  PLT 172 193 186 159 192   Cardiac Enzymes: No results for input(s): CKTOTAL, CKMB, CKMBINDEX, TROPONINI in the last 168 hours. BNP: BNP (last 3 results) Recent Labs    11/03/17 0801  BNP 3,320.0*     CBG: Recent Labs  Lab 10/22/18 1140 10/22/18 1559 10/22/18 2004 10/22/18 2358 10/23/18 0447  GLUCAP 160* 304* 170* 125* 169*     Signed:  Oswald Hillock MD.  [facility] 10/23/2018, 11:24 AM

## 2018-10-23 NOTE — Progress Notes (Signed)
Subjective:  No new c/o, seen on HD. No cough or SOB. Hungry.   Objective Vital signs in last 24 hours: Vitals:   10/23/18 1000 10/23/18 1030 10/23/18 1048 10/23/18 1254  BP: (!) 116/30 (!) 117/32 129/71 (!) 34/34  Pulse: 93 82 98 (!) 104  Resp:   16 (!) 22  Temp:   98.2 F (36.8 C) 98 F (36.7 C)  TempSrc:   Oral Oral  SpO2:   99%   Weight:   53.1 kg   Height:       Weight change:   Physical Exam: General: Chronically ill  AAF, NAD  Heart: RRR Lungs: Grossly clear  Abdomen: BS pos . Soft , Nt, ND Extremities: no Lower extrem. edema with BILAT AKA  Dialysis Access: R IJ Perm cath   OP Dialysis Orders:  [facility] TTS  3.5h 61kg 400/800 Hep 1800 R TDC Mircera 225 q2weeks (last 09/14/18)  Summary Pt is a 73 y.o. yo female ESRD who was admitted on 09/24/2018 with FTT after AKA culminating with PNA and respiratory failure req vent  Problem/Plan: 1. ESRD - HD on TTS. HD today upstairs.  2. Pulm issues = PNA and Resp Failure resolved 3. HTN/volume - BP stable ,no bp meds. At dry wt , euvolemic on exam 4. C. Diff = no more diarrhea  Reported this am with rectal tube removed  5. Sacral Decub= Getting [employer]  For Large /deep ulcer  6. L AKA = earlier this admit had L AKA, now is bilat amputee 7. Anemia - hgb 9.0  Max Aranesp q sat /no iron for now with infxn issue  8. Secondary hyperparathyroidism - phos has  been low  2.4 today with no Phoslyra binder  Ca corrected 9.8 9. FTT- low albumin/decub that is large - has had palliative discussion but family requesting continued max efforts, cont with these discussions 10. Renal mass- not of clinical significance- would not chase down. 11. Dispo - appreciate case management assist- she does need to be able to sit up for OP HD which she is not strong enough to do right now. Will need LTACH most likely   Kelly Splinter MD [facility] pager [phone]   10/23/2018, 1:00 PM  Labs: Basic Metabolic Panel: Recent Labs   Lab 10/21/18 0312 10/22/18 0318 10/23/18 0241  NA 134* 134* 134*  K 3.9 3.8 4.4  CL 100 100 100  CO2 21* 24 22  GLUCOSE 127* 163* 202*  BUN 49* 31* 50*  CREATININE 3.24* 2.51* 3.70*  CALCIUM 8.5* 8.1* 8.3*  PHOS 3.4 2.4* 3.9   Liver Function Tests: Recent Labs  Lab 10/21/18 0312 10/22/18 0318 10/23/18 0241  ALBUMIN 1.8* 1.7* 1.7*   No results for input(s): LIPASE, AMYLASE in the last 168 hours. No results for input(s): AMMONIA in the last 168 hours. CBC: Recent Labs  Lab 10/17/18 0228 10/18/18 0244 10/19/18 0330 10/20/18 0306 10/23/18 0738  WBC 14.1* 14.1* 12.0* 10.6* 7.5  HGB 9.3* 9.7* 9.7* 9.0* 8.7*  HCT 31.7* 33.6* 33.0* 31.7* 29.6*  MCV 92.7 95.2 95.1 96.6 97.4  PLT 172 193 186 159 192   Cardiac Enzymes: No results for input(s): CKTOTAL, CKMB, CKMBINDEX, TROPONINI in the last 168 hours. CBG: Recent Labs  Lab 10/22/18 1559 10/22/18 2004 10/22/18 2358 10/23/18 0447 10/23/18 1252  GLUCAP 304* 170* 125* 169* 91    Studies/Results: No results found. Medications: . sodium chloride Stopped (10/11/18 0902)   . acetaminophen  650 mg Oral Q6H  .  amiodarone  200 mg Per Tube Daily  . apixaban  2.5 mg Oral BID  . brimonidine  1 drop Left Eye BID   And  . timolol  1 drop Left Eye BID  . Chlorhexidine Gluconate Cloth  6 each Topical Q0600  . Chlorhexidine Gluconate Cloth  6 each Topical Q0600  . darbepoetin (ARANESP) injection - DIALYSIS  200 mcg Intravenous Q Sat-HD  . feeding supplement (PRO-STAT SUGAR FREE 64)  30 mL Oral BID  . insulin aspart  0-9 Units Subcutaneous TID WC  . latanoprost  1 drop Both Eyes QHS  . multivitamin  1 tablet Oral QHS  . pantoprazole  40 mg Oral BID  . predniSONE  10 mg Oral Q breakfast  . sodium chloride flush  3 mL Intravenous Q12H  . sucralfate  1 g Oral TID WC & HS  . vancomycin  125 mg Oral Q6H

## 2018-10-29 ENCOUNTER — Telehealth: Payer: Self-pay

## 2018-10-29 NOTE — Telephone Encounter (Signed)
FYI Pt's spouse cancelled apt for Wed 10/31/18 with [facility]SL due to being in the hospital. Pt was scheduled for "REFERRED BY DR COLADONATO/ ABD PAIN, COLITIS"

## 2018-11-02 DIAGNOSIS — R1310 Dysphagia, unspecified: Secondary | ICD-10-CM | POA: Diagnosis not present

## 2018-11-02 DIAGNOSIS — J9600 Acute respiratory failure, unspecified whether with hypoxia or hypercapnia: Secondary | ICD-10-CM | POA: Diagnosis not present

## 2018-11-03 ENCOUNTER — Ambulatory Visit: Payer: Medicare Other | Admitting: Gastroenterology

## 2018-11-03 ENCOUNTER — Encounter

## 2018-11-04 NOTE — Telephone Encounter (Signed)
noted 

## 2018-11-04 NOTE — Progress Notes (Deleted)
Cardiology Office Note  Date: 11/04/2018   ID: [name], [name] 10/10/1945, MRN [id number]  PCP: Sharilyn Sites, MD  Primary Cardiologist: Rozann Lesches, MD   No chief complaint on file.   History of Present Illness: [name] is a 73 y.o. female last seen in November 2019 by Ms. Strader PA-C.  She has a history of left transmetatarsal amputation in early December 2019 followed by gangrene and ultimately left AKA in late December 2019.  She had a prolonged hospital stay complicated by hypoxic respiratory failure requiring ventilatory support, sepsis, C. difficile infection and acute on chronic anemia in the setting of upper GI bleed.  She was continued on amiodarone and Lopressor at the last visit for treatment of paroxysmal atrial fibrillation/flutter as well as aberrant SVT.  Past Medical History:  Diagnosis Date  . Anemia   . Arthritis   . Asthma   . Atrial fibrillation ([place])   . Atrial flutter ([place])    a. s/p TEE/DCCV December 2013 b. 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 Bone spur of toe    Right 5th toe  . Chronic diastolic CHF (congestive heart failure) (HCC)    a. EF 50-55% by echo in 2015  . Colon polyposis   . COPD (chronic obstructive pulmonary disease) ([place])   . DVT of upper extremity (deep vein thrombosis) ([place])    Right basilic vein, June 9381  . ESRD (end stage renal disease) on dialysis ([place])    "TTF; [place]; off [employer]." (09/07/2018)  . Essential hypertension, benign   . Fractures   . Glaucoma   . Lupus ([place])   . Pericarditis   . SLE (systemic lupus erythematosus) ([place])   . Type 2 diabetes mellitus ([place])   . UTI (lower urinary tract infection)   . Wound of right leg 06/2017    Past Surgical History:  Procedure Laterality Date  . ABDOMINAL AORTOGRAM  W/LOWER EXTREMITY N/A 12/18/2017   Procedure: ABDOMINAL AORTOGRAM W/LOWER EXTREMITY;  Surgeon: Elam Dutch, MD;  Location: [facility];  Service: Cardiovascular;  Laterality: N/A;  bilateral  . ABDOMINAL HYSTERECTOMY  1983  . AMPUTATION Right 06/08/2018   Procedure: RIGHT ABOVE KNEE AMPUTATION;  Surgeon: Elam Dutch, MD;  Location: [facility] OR;  Service: Vascular;  Laterality: Right;  . AMPUTATION Left 09/26/2018   Procedure: AMPUTATION ABOVE KNEE;  Surgeon: Rosetta Posner, MD;  Location: [facility] OR;  Service: Vascular;  Laterality: Left;  WILL BE AN INPATIENT  . ANKLE SURGERY  1993  . AV FISTULA PLACEMENT Left 03/27/2014   Procedure: ARTERIOVENOUS (AV) FISTULA CREATION;  Surgeon: Rosetta Posner, MD;  Location: [facility];  Service: Vascular;  Laterality: Left;  . BACK SURGERY  1980  . BASCILIC VEIN TRANSPOSITION Left 01/13/2018   Procedure: LEFT BASILIC VEIN TRANSPOSITION SECOND STAGE;  Surgeon: Elam Dutch, MD;  Location: [facility];  Service: Vascular;  Laterality: Left;  . CARDIOVASCULAR STRESS TEST  12/19/2009   no stress induced rhythm abnormalities, ekg negative for ischemia  . CARDIOVERSION  09/16/2012   Procedure: CARDIOVERSION;  Surgeon: Sanda Klein, MD;  Location: [facility];  Service: Cardiovascular;  Laterality: N/A;  . CATARACT EXTRACTION    . COLONOSCOPY  09/20/2012   Dr. [name]: multiple tubular adenomas   . COLONOSCOPY  2005   Dr. [name]. Polyps,  path unknown   . COLONOSCOPY N/A 07/24/2016   Dr. [name]: 3 significant size polyps removed from the colon, tubular adenomas.  Next colonoscopy in October 2020.  Marland Kitchen ESOPHAGOGASTRODUODENOSCOPY  09/20/2012   Dr. [name]: duodenal erosion and possible resolving ulcer at the angularis   . ESOPHAGOGASTRODUODENOSCOPY N/A 03/24/2018   Dr. [name]: Mild erosive reflux esophagitis, erosive gastropathy and enteropathy fairly extensive, no H. pylori on biopsy.  Marland Kitchen EYE SURGERY    . IR FLUORO GUIDE CV LINE RIGHT  11/09/2017  . IR RADIOLOGIST EVAL  & MGMT  09/01/2018  . IR US GUIDE VASC ACCESS RIGHT  11/09/2017  . REVISON OF ARTERIOVENOUS FISTULA Left 03/15/2018   Procedure: REVISION OF Left arm BASILIC VEIN TRANSPOSITION;  Surgeon: Angelia Mould, MD;  Location: [facility];  Service: Vascular;  Laterality: Left;  . TEE WITHOUT CARDIOVERSION  09/16/2012   Procedure: TRANSESOPHAGEAL ECHOCARDIOGRAM (TEE);  Surgeon: Sanda Klein, MD;  Location[facility];  Service: Cardiovascular;  Laterality: N/A;  . TRANSESOPHAGEAL ECHOCARDIOGRAM WITH CARDIOVERSION  09/16/2012   EF 60-65%, moderate LVH, moderate regurg of the aortic valve, LA moderately dilated  . TRANSMETATARSAL AMPUTATION Left 09/06/2018   Procedure: TRANSMETATARSAL AMPUTATION;  Surgeon: Angelia Mould, MD;  Location: [facility] OR;  Service: Vascular;  Laterality: Left;    Current Outpatient Medications  Medication Sig Dispense Refill  . acetaminophen (TYLENOL) 325 MG tablet Take 1-2 tablets (325-650 mg total) by mouth every 4 (four) hours as needed for mild pain. (Patient taking differently: Take 325-650 mg by mouth every 4 (four) hours as needed (for mild to moderate pain). )    . albuterol (PROVENTIL HFA;VENTOLIN HFA) 108 (90 BASE) MCG/ACT inhaler Inhale 2 puffs into the lungs every 6 (six) hours as needed for wheezing or shortness of breath.     . allopurinol (ZYLOPRIM) 100 MG tablet Take 100 mg by mouth every Monday, Wednesday, and Friday.     . Amino Acids-Protein Hydrolys (FEEDING SUPPLEMENT, PRO-STAT SUGAR FREE 64,) LIQD Take 30 mLs by mouth 2 (two) times daily. 900 mL 0  . amiodarone (PACERONE) 200 MG tablet Take 1 tablet (200 mg total) by mouth daily. 90 tablet 3  . apixaban (ELIQUIS) 2.5 MG TABS tablet Take 1 tablet (2.5 mg total) by mouth 2 (two) times daily. 60 tablet   . atorvastatin (LIPITOR) 20 MG tablet Take 20 mg by mouth at bedtime.     . brimonidine-timolol (COMBIGAN) 0.2-0.5 % ophthalmic solution Place 1 drop into the left eye 2 (two) times daily.     .  carbamazepine (TEGRETOL XR) 100 MG 12 hr tablet Take 100 mg by mouth 2 (two) times daily.    Marland Kitchen docusate sodium (COLACE) 100 MG capsule Take 100 mg by mouth 2 (two) times daily.    . fentaNYL (SUBLIMAZE) 100 MCG/2ML injection Inject 0.25 mLs (12.5 mcg total) into the vein every 2 (two) hours as needed for moderate pain. 2 mL 0  . folic acid (FOLVITE) 502 MCG tablet Take 800 mcg by mouth daily.    . hydroxychloroquine (PLAQUENIL) 200 MG tablet Take 1 tablet (200 mg total) by mouth daily.    . hydrOXYzine (ATARAX) 10 MG/5ML syrup Take 5 mLs (10 mg total) by mouth 2 (two) times daily as needed for itching. 240 mL 0  . insulin aspart (NOVOLOG) 100 UNIT/ML injection Inject 0-9 Units into the skin 3 (three) times daily with meals. 10 mL 11  . latanoprost (XALATAN) 0.005 % ophthalmic solution Place 1 drop into both eyes at  bedtime.    . metoprolol tartrate (LOPRESSOR) 25 MG tablet Take 1/2 tablet twice a day on Mondays, Wednesdays and Fridays (Patient taking differently: Take 12.5 mg by mouth See admin instructions. Take 12.5 mg by mouth two times a day on Mon/Wed/Fri) 30 tablet 0  . multivitamin (RENA-VIT) TABS tablet Take 1 tablet by mouth daily.    . pantoprazole (PROTONIX) 40 MG tablet Take 1 tablet (40 mg total) by mouth daily. 30 tablet 11  . predniSONE (DELTASONE) 10 MG tablet Take 1 tablet (10 mg total) by mouth daily with breakfast.    . sodium hypochlorite (DAKIN'S 1/4 STRENGTH) 0.125 % SOLN Irrigate with as directed every morning. 473 mL 0  . sucralfate (CARAFATE) 1 GM/10ML suspension Take 10 mLs (1 g total) by mouth 4 (four) times daily -  with meals and at bedtime for 30 days. 1200 mL 0  . UNIFINE PENTIPS 31G X 6 MM MISC USE AS DIRECTED AT BEDTIME WITH LEVEMIR AND WITH SLIDING SCALE INSULIN. (Patient taking differently: as directed. ) 100 each 0   No current facility-administered medications for this visit.    Allergies:  Ace inhibitors and Fentanyl   Social History: The patient  reports  that she quit smoking about 6 years ago. Her smoking use included cigarettes. She has a 30.00 pack-year smoking history. She has never used smokeless tobacco. She reports that she does not drink alcohol or use drugs.   Family History: The patient's family history includes Diabetes in her brother, father, and sister; Hypertension in her brother, father, and sister; Stroke in her mother.   ROS:  Please see the history of present illness. Otherwise, complete review of systems is positive for {NONE DEFAULTED:18576::"none"}.  All other systems are reviewed and negative.   Physical Exam: VS:  There were no vitals taken for this visit., BMI There is no height or weight on file to calculate BMI.  Wt Readings from Last 3 Encounters:  10/23/18 117 lb 1 oz (53.1 kg)  09/24/18 134 lb (60.8 kg)  09/11/18 134 lb 7.7 oz (61 kg)    General: Patient appears comfortable at rest. HEENT: Conjunctiva and lids normal, oropharynx clear with moist mucosa. Neck: Supple, no elevated JVP or carotid bruits, no thyromegaly. Lungs: Clear to auscultation, nonlabored breathing at rest. Cardiac: Regular rate and rhythm, no S3 or significant systolic murmur, no pericardial rub. Abdomen: Soft, nontender, no hepatomegaly, bowel sounds present, no guarding or rebound. Extremities: No pitting edema, distal pulses 2+. Skin: Warm and dry. Musculoskeletal: No kyphosis. Neuropsychiatric: Alert and oriented x3, affect grossly appropriate.  ECG: I personally reviewed the tracing from 10/12/2018 which showed sinus rhythm with LVH and repolarization abnormalities.  Recent Labwork: 10/02/2018: TSH 2.800 10/12/2018: ALT 22; AST 30 10/13/2018: Magnesium 1.9 10/23/2018: BUN 50; Creatinine, Ser 3.70; Hemoglobin 8.7; Platelets 192; Potassium 4.4; Sodium 134   Other Studies Reviewed Today:  Echocardiogram 11/03/2017: Study Conclusions  - Left ventricle: The cavity size was normal. Wall thickness was   increased in a pattern of moderate  LVH. Systolic function was   normal. The estimated ejection fraction was in the range of 55%   to 60%. Wall motion was normal; there were no regional wall   motion abnormalities. Features are consistent with a pseudonormal   left ventricular filling pattern, with concomitant abnormal   relaxation and increased filling pressure (grade 2 diastolic   dysfunction). Doppler parameters are consistent with high   ventricular filling pressure. - Aortic valve: Moderately calcified annulus. Trileaflet; mildly  thickened, moderately calcified leaflets. Sclerosis without   stenosis. There was moderate regurgitation. - Mitral valve: Calcified annulus. Moderately calcified leaflets .   There was moderate regurgitation. - Left atrium: The atrium was severely dilated. - Right ventricle: Systolic function was moderately reduced. - Right atrium: The atrium was moderately dilated. - Tricuspid valve: Mildly thickened leaflets. There was moderate   regurgitation. - Pulmonary arteries: PA peak pressure: 63 mm Hg (S). - Inferior vena cava: The vessel was dilated. The respirophasic   diameter changes were blunted (< 50%), consistent with elevated   central venous pressure. Estimated CVP 15 mmHg.  Assessment and Plan:   Current medicines were reviewed with the patient today.  No orders of the defined types were placed in this encounter.   Disposition:  Signed, Satira Sark, MD, [facility] 11/04/2018 10:58 AM    [place] [facility] [address] [place], [place] [address] Phone: [phone]; Fax: [phone]

## 2018-11-08 ENCOUNTER — Ambulatory Visit: Payer: Medicare Other | Admitting: Cardiology

## 2018-11-17 ENCOUNTER — Encounter (HOSPITAL_COMMUNITY): Payer: Self-pay | Admitting: Oncology

## 2018-11-28 DEATH — deceased

## 2019-02-16 ENCOUNTER — Ambulatory Visit: Payer: Medicare Other | Admitting: "Endocrinology

## 2020-02-06 IMAGING — CR DG HIP (WITH OR WITHOUT PELVIS) 1V*R*
3 series · 3 of 3 positions shown · non-contrast
Comparison: None.

CLINICAL DATA: Right hip pain

EXAM:
DG HIP (WITH OR WITHOUT PELVIS) 1V RIGHT

[hip ap]
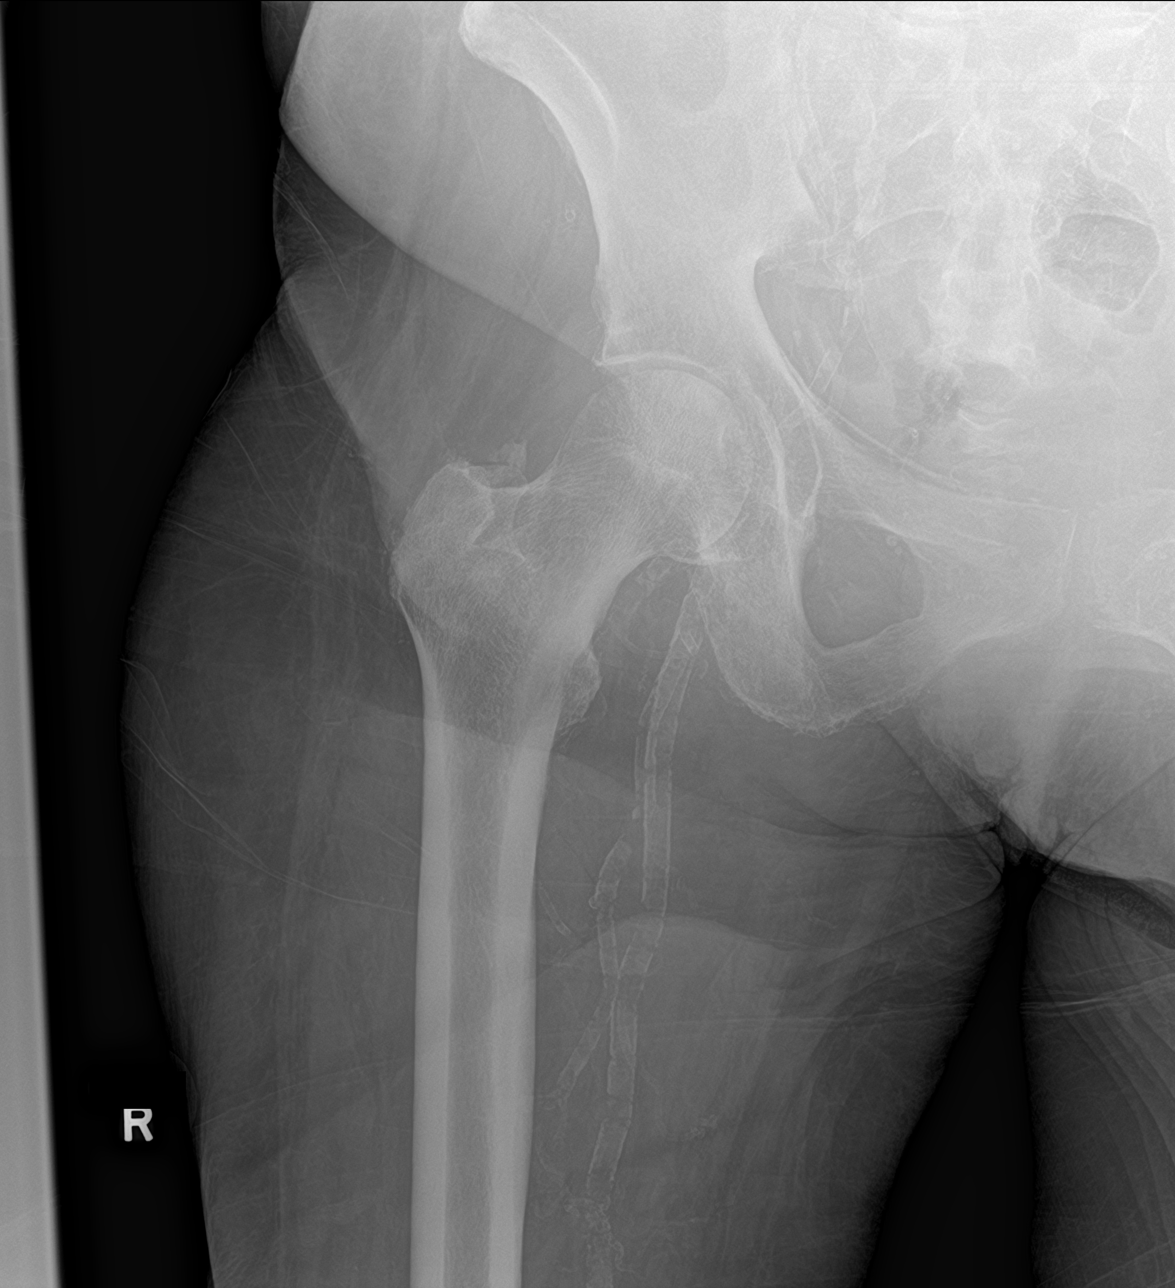

[hip lat]
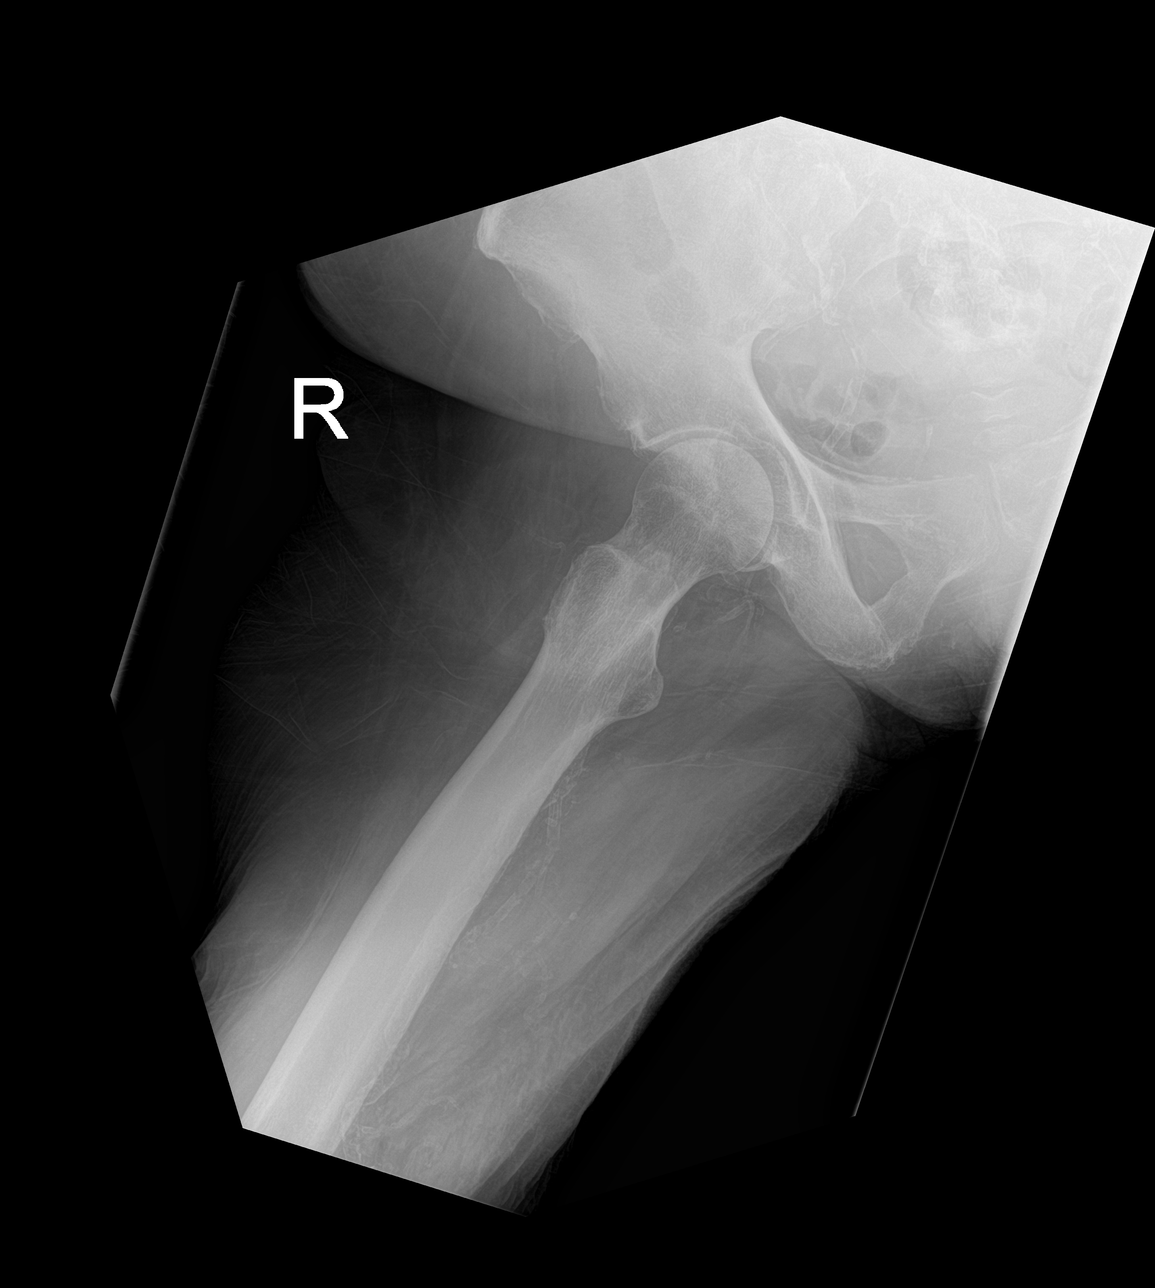

[pelvis ap]
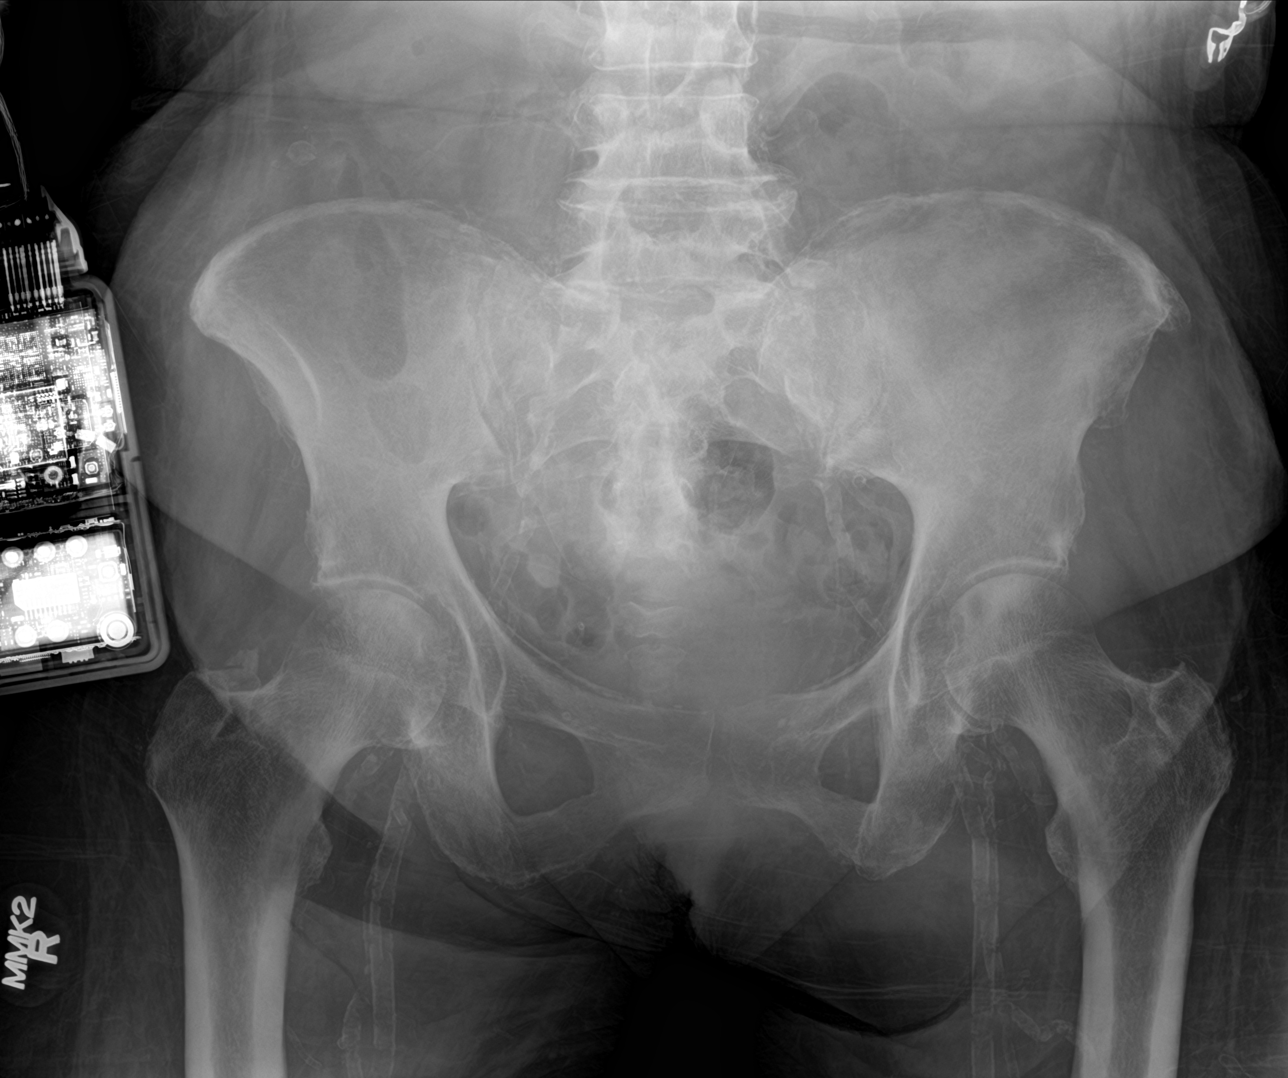

[3 of 3 positions shown; findings below may reference images not displayed]

FINDINGS: L4-5 degenerative disc disease with lower lumbar facet arthropathy
is seen. The bony pelvis appears intact without acute fracture or
suspicious osseous lesions. No pelvic diastasis is noted.
Mild-to-moderate joint space narrowing of both hips appears stable.
Mild subchondral sclerosis of the femoral heads are identified
without flattening. Findings may be degenerative in etiology. Subtle
changes of AVN are not entirely excluded. Soft tissue calcification
adjacent to the greater trochanter of the right femur may reflect
gluteal tendinosis.
IMPRESSION: 1. No acute osseous abnormality of the bony pelvis and right hip.
2. Mild-to-moderate degenerative joint space narrowing of both hips.
Subchondral sclerosis of the femoral heads bilaterally without
flattening may reflect osteoarthritis. Changes of AVN are not
entirely excluded.
3. Soft tissue calcification adjacent to the greater trochanter of
the right femur may reflect gluteal tendinosis.

## 2020-02-06 IMAGING — CR DG ABDOMEN 1V
1 series · 1 of 1 positions shown · non-contrast
Comparison: 07/28/2018 CT and 12/16/2017 abdomen radiograph

CLINICAL DATA: Abdominal and right hip pain.

EXAM:
ABDOMEN - 1 VIEW

[abdomen kub]
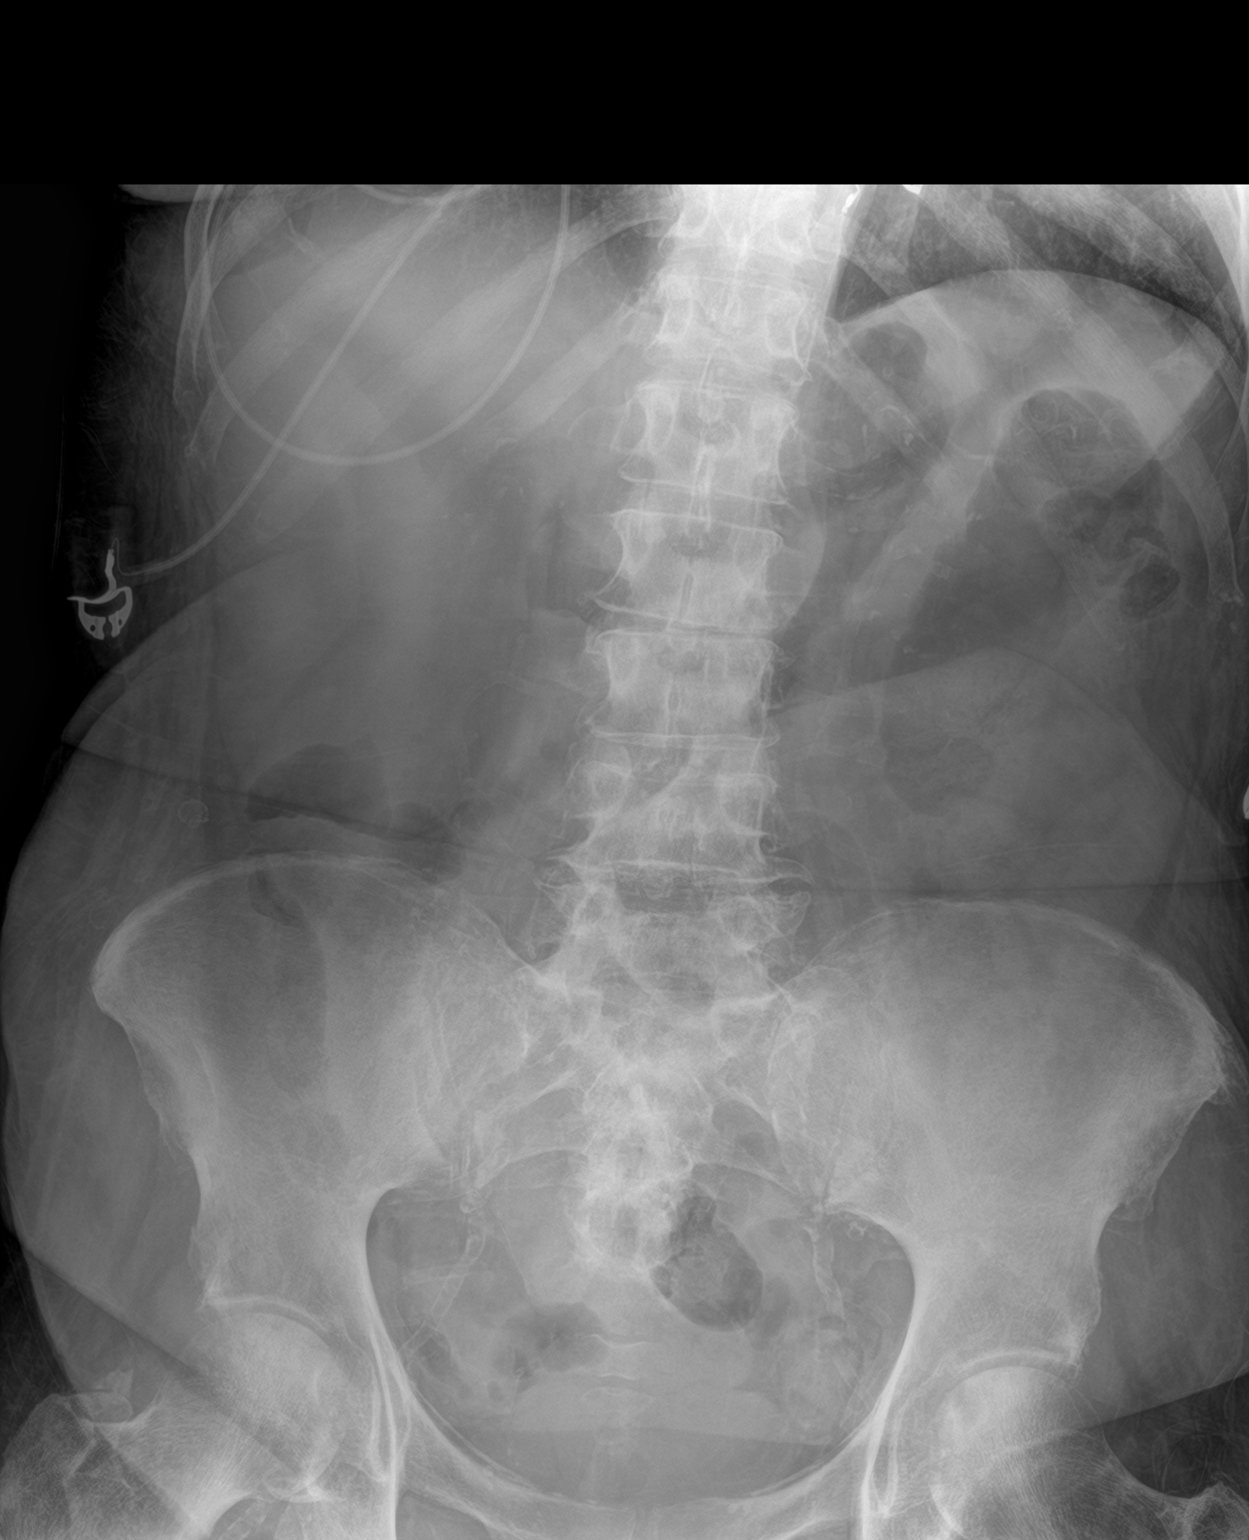

[1 of 1 positions shown; findings below may reference images not displayed]

FINDINGS: Scattered air containing small large bowel without bowel
obstruction. Lumbar spondylosis with degenerative disc disease is
identified greatest at L2-3. Mild joint space narrowing of both
hips. No radiopaque calculi nor acute osseous abnormality.
IMPRESSION: Nonspecific bowel gas pattern without bowel obstruction. Lumbar
spondylosis.

## 2020-02-12 IMAGING — DX DG CHEST 1V PORT
1 series · 1 of 1 positions shown · non-contrast
Comparison: 10/01/2018 at 8787 hours.

CLINICAL DATA: Acute respiratory failure with hypoxia. Follow-up
exam.

EXAM:
PORTABLE CHEST 1 VIEW

[chest ap]
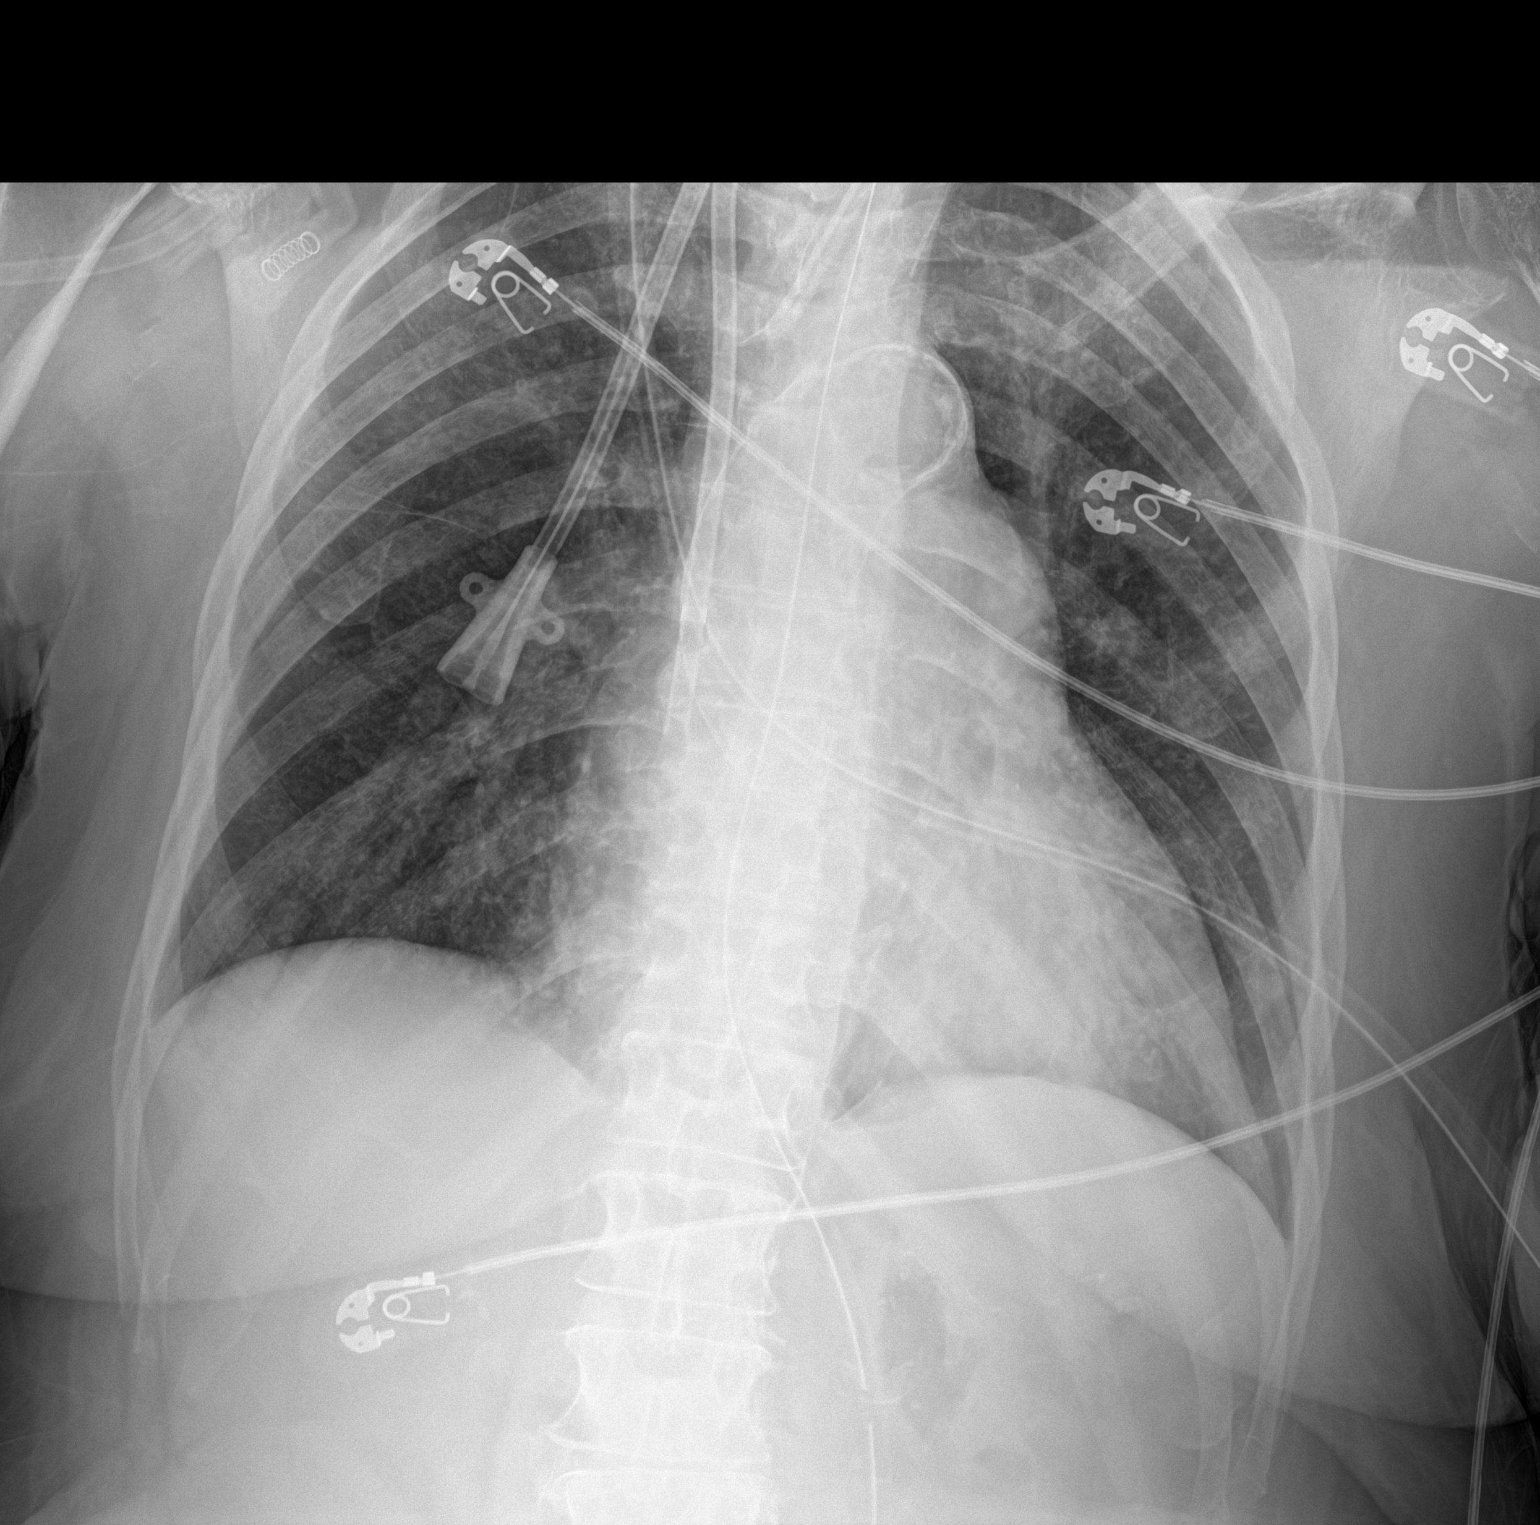

[1 of 1 positions shown; findings below may reference images not displayed]

FINDINGS: Since prior exam, an endotracheal tube has been placed. Tip projects
2.7 cm above the [place].

There is also new nasal/orogastric tube, which passes below the
diaphragm well into the stomach.

Stable mild cardiomegaly. Lungs demonstrate mildly prominent
bronchovascular markings but no evidence of pneumonia or pulmonary
edema.

Right internal jugular dual-lumen tunneled central venous catheter
is stable.

No pneumothorax or pleural effusion.
IMPRESSION: 1. New endotracheal tube and nasal/orogastric tube are both well
positioned.
2. No other change from the earlier exam. No acute findings in the
lungs.
